# Patient Record
Sex: Female | Born: 1960
Health system: Southern US, Community
[De-identification: ages and names within clinical notes are randomized; demographics above are authoritative.]

## PROBLEM LIST (undated history)

## (undated) DIAGNOSIS — M419 Scoliosis, unspecified: Secondary | ICD-10-CM

## (undated) DIAGNOSIS — E785 Hyperlipidemia, unspecified: Secondary | ICD-10-CM

## (undated) DIAGNOSIS — G894 Chronic pain syndrome: Secondary | ICD-10-CM

## (undated) DIAGNOSIS — I5032 Chronic diastolic (congestive) heart failure: Secondary | ICD-10-CM

## (undated) DIAGNOSIS — K589 Irritable bowel syndrome without diarrhea: Secondary | ICD-10-CM

## (undated) DIAGNOSIS — M069 Rheumatoid arthritis, unspecified: Secondary | ICD-10-CM

## (undated) DIAGNOSIS — Z8739 Personal history of other diseases of the musculoskeletal system and connective tissue: Secondary | ICD-10-CM

## (undated) DIAGNOSIS — I493 Ventricular premature depolarization: Secondary | ICD-10-CM

## (undated) DIAGNOSIS — G43909 Migraine, unspecified, not intractable, without status migrainosus: Secondary | ICD-10-CM

## (undated) DIAGNOSIS — J189 Pneumonia, unspecified organism: Secondary | ICD-10-CM

## (undated) DIAGNOSIS — M351 Other overlap syndromes: Secondary | ICD-10-CM

## (undated) DIAGNOSIS — I350 Nonrheumatic aortic (valve) stenosis: Secondary | ICD-10-CM

## (undated) DIAGNOSIS — K635 Polyp of colon: Secondary | ICD-10-CM

## (undated) DIAGNOSIS — E119 Type 2 diabetes mellitus without complications: Secondary | ICD-10-CM

## (undated) DIAGNOSIS — G8929 Other chronic pain: Secondary | ICD-10-CM

## (undated) DIAGNOSIS — L309 Dermatitis, unspecified: Secondary | ICD-10-CM

## (undated) DIAGNOSIS — M479 Spondylosis, unspecified: Secondary | ICD-10-CM

## (undated) DIAGNOSIS — Z8719 Personal history of other diseases of the digestive system: Secondary | ICD-10-CM

## (undated) DIAGNOSIS — K8020 Calculus of gallbladder without cholecystitis without obstruction: Secondary | ICD-10-CM

## (undated) DIAGNOSIS — K219 Gastro-esophageal reflux disease without esophagitis: Secondary | ICD-10-CM

## (undated) DIAGNOSIS — I10 Essential (primary) hypertension: Secondary | ICD-10-CM

## (undated) DIAGNOSIS — E039 Hypothyroidism, unspecified: Secondary | ICD-10-CM

## (undated) DIAGNOSIS — G4719 Other hypersomnia: Secondary | ICD-10-CM

## (undated) DIAGNOSIS — Z9989 Dependence on other enabling machines and devices: Secondary | ICD-10-CM

## (undated) DIAGNOSIS — I2510 Atherosclerotic heart disease of native coronary artery without angina pectoris: Secondary | ICD-10-CM

## (undated) DIAGNOSIS — R011 Cardiac murmur, unspecified: Secondary | ICD-10-CM

## (undated) DIAGNOSIS — K760 Fatty (change of) liver, not elsewhere classified: Secondary | ICD-10-CM

## (undated) DIAGNOSIS — Z9289 Personal history of other medical treatment: Secondary | ICD-10-CM

## (undated) DIAGNOSIS — M545 Low back pain, unspecified: Secondary | ICD-10-CM

## (undated) DIAGNOSIS — G4730 Sleep apnea, unspecified: Secondary | ICD-10-CM

## (undated) DIAGNOSIS — M3500 Sicca syndrome, unspecified: Secondary | ICD-10-CM

## (undated) DIAGNOSIS — M542 Cervicalgia: Secondary | ICD-10-CM

## (undated) DIAGNOSIS — M797 Fibromyalgia: Secondary | ICD-10-CM

## (undated) HISTORY — DX: Pneumonia, unspecified organism: J18.9

## (undated) HISTORY — DX: Sjogren syndrome, unspecified: M35.00

## (undated) HISTORY — PX: DILATION AND CURETTAGE OF UTERUS: SHX78

## (undated) HISTORY — DX: Calculus of gallbladder without cholecystitis without obstruction: K80.20

## (undated) HISTORY — DX: Hyperlipidemia, unspecified: E78.5

## (undated) HISTORY — DX: Fatty (change of) liver, not elsewhere classified: K76.0

## (undated) HISTORY — DX: Nonrheumatic aortic (valve) stenosis: I35.0

## (undated) HISTORY — DX: Polyp of colon: K63.5

## (undated) HISTORY — DX: Fibromyalgia: M79.7

## (undated) HISTORY — DX: Other hypersomnia: G47.19

## (undated) HISTORY — PX: TONSILLECTOMY AND ADENOIDECTOMY: SUR1326

## (undated) HISTORY — PX: MUSCLE BIOPSY: SHX716

## (undated) HISTORY — PX: APPENDECTOMY: SHX54

## (undated) HISTORY — DX: Ventricular premature depolarization: I49.3

## (undated) HISTORY — PX: WRIST FRACTURE SURGERY: SHX121

## (undated) HISTORY — PX: LAPAROSCOPIC CHOLECYSTECTOMY: SUR755

## (undated) HISTORY — PX: FRACTURE SURGERY: SHX138

## (undated) HISTORY — DX: Chronic diastolic (congestive) heart failure: I50.32

## (undated) HISTORY — PX: BREAST BIOPSY: SHX20

---

## 1998-02-15 ENCOUNTER — Ambulatory Visit (HOSPITAL_COMMUNITY): Admission: RE | Admit: 1998-02-15 | Discharge: 1998-02-15 | Payer: Self-pay | Admitting: General Surgery

## 1999-05-04 ENCOUNTER — Other Ambulatory Visit: Admission: RE | Admit: 1999-05-04 | Discharge: 1999-05-04 | Payer: Self-pay | Admitting: *Deleted

## 2005-06-11 HISTORY — PX: ABDOMINAL HYSTERECTOMY: SHX81

## 2008-12-06 ENCOUNTER — Inpatient Hospital Stay (HOSPITAL_COMMUNITY): Admission: EM | Admit: 2008-12-06 | Discharge: 2008-12-12 | Payer: Self-pay | Admitting: Internal Medicine

## 2008-12-06 ENCOUNTER — Encounter: Payer: Self-pay | Admitting: Emergency Medicine

## 2008-12-06 ENCOUNTER — Ambulatory Visit: Payer: Self-pay | Admitting: Diagnostic Radiology

## 2008-12-07 ENCOUNTER — Encounter (INDEPENDENT_AMBULATORY_CARE_PROVIDER_SITE_OTHER): Payer: Self-pay | Admitting: Internal Medicine

## 2008-12-07 ENCOUNTER — Ambulatory Visit: Payer: Self-pay | Admitting: Surgery

## 2008-12-09 ENCOUNTER — Ambulatory Visit: Payer: Self-pay | Admitting: Infectious Diseases

## 2010-07-12 DIAGNOSIS — Z9289 Personal history of other medical treatment: Secondary | ICD-10-CM

## 2010-07-12 HISTORY — DX: Personal history of other medical treatment: Z92.89

## 2010-07-12 HISTORY — PX: OOPHORECTOMY: SHX86

## 2010-07-14 ENCOUNTER — Ambulatory Visit: Admission: RE | Admit: 2010-07-14 | Discharge: 2010-07-14 | Payer: Self-pay | Admitting: Gynecologic Oncology

## 2010-08-02 ENCOUNTER — Encounter: Payer: Self-pay | Admitting: Obstetrics and Gynecology

## 2010-08-02 ENCOUNTER — Inpatient Hospital Stay (HOSPITAL_COMMUNITY): Admission: RE | Admit: 2010-08-02 | Discharge: 2010-08-08 | Payer: Self-pay | Admitting: Obstetrics and Gynecology

## 2010-08-02 ENCOUNTER — Ambulatory Visit: Payer: Self-pay | Admitting: Cardiology

## 2010-08-04 ENCOUNTER — Encounter: Payer: Self-pay | Admitting: Cardiology

## 2010-10-01 ENCOUNTER — Emergency Department (HOSPITAL_BASED_OUTPATIENT_CLINIC_OR_DEPARTMENT_OTHER)
Admission: EM | Admit: 2010-10-01 | Discharge: 2010-10-01 | Payer: Self-pay | Source: Home / Self Care | Admitting: Emergency Medicine

## 2010-10-10 ENCOUNTER — Ambulatory Visit (HOSPITAL_COMMUNITY): Admission: RE | Admit: 2010-10-10 | Payer: Self-pay | Admitting: Obstetrics and Gynecology

## 2010-11-22 LAB — BASIC METABOLIC PANEL
BUN: 10 mg/dL (ref 6–23)
BUN: 18 mg/dL (ref 6–23)
BUN: 5 mg/dL — ABNORMAL LOW (ref 6–23)
BUN: 8 mg/dL (ref 6–23)
CO2: 23 mEq/L (ref 19–32)
CO2: 24 mEq/L (ref 19–32)
CO2: 28 mEq/L (ref 19–32)
CO2: 31 mEq/L (ref 19–32)
Calcium: 7.9 mg/dL — ABNORMAL LOW (ref 8.4–10.5)
Calcium: 8.1 mg/dL — ABNORMAL LOW (ref 8.4–10.5)
Calcium: 8.2 mg/dL — ABNORMAL LOW (ref 8.4–10.5)
Calcium: 8.9 mg/dL (ref 8.4–10.5)
Chloride: 101 mEq/L (ref 96–112)
Chloride: 101 mEq/L (ref 96–112)
Chloride: 104 mEq/L (ref 96–112)
Chloride: 106 mEq/L (ref 96–112)
Creatinine, Ser: 0.7 mg/dL (ref 0.4–1.2)
Creatinine, Ser: 0.77 mg/dL (ref 0.4–1.2)
Creatinine, Ser: 1.63 mg/dL — ABNORMAL HIGH (ref 0.4–1.2)
Creatinine, Ser: 1.91 mg/dL — ABNORMAL HIGH (ref 0.4–1.2)
GFR calc Af Amer: 34 mL/min — ABNORMAL LOW (ref >60)
GFR calc Af Amer: 41 mL/min — ABNORMAL LOW (ref >60)
GFR calc Af Amer: >60 mL/min (ref >60)
GFR calc Af Amer: >60 mL/min (ref >60)
GFR calc non Af Amer: 28 mL/min — ABNORMAL LOW (ref >60)
GFR calc non Af Amer: 34 mL/min — ABNORMAL LOW (ref >60)
GFR calc non Af Amer: >60 mL/min (ref >60)
GFR calc non Af Amer: >60 mL/min (ref >60)
Glucose, Bld: 132 mg/dL — ABNORMAL HIGH (ref 70–99)
Glucose, Bld: 223 mg/dL — ABNORMAL HIGH (ref 70–99)
Glucose, Bld: 337 mg/dL — ABNORMAL HIGH (ref 70–99)
Glucose, Bld: 91 mg/dL (ref 70–99)
Potassium: 3.8 mEq/L (ref 3.5–5.1)
Potassium: 4 mEq/L (ref 3.5–5.1)
Potassium: 5.2 mEq/L — ABNORMAL HIGH (ref 3.5–5.1)
Potassium: 6.5 mEq/L (ref 3.5–5.1)
Sodium: 132 mEq/L — ABNORMAL LOW (ref 135–145)
Sodium: 133 mEq/L — ABNORMAL LOW (ref 135–145)
Sodium: 140 mEq/L (ref 135–145)
Sodium: 142 mEq/L (ref 135–145)

## 2010-11-22 LAB — GLUCOSE, CAPILLARY
Glucose-Capillary: 101 mg/dL — ABNORMAL HIGH (ref 70–99)
Glucose-Capillary: 104 mg/dL — ABNORMAL HIGH (ref 70–99)
Glucose-Capillary: 110 mg/dL — ABNORMAL HIGH (ref 70–99)
Glucose-Capillary: 113 mg/dL — ABNORMAL HIGH (ref 70–99)
Glucose-Capillary: 120 mg/dL — ABNORMAL HIGH (ref 70–99)
Glucose-Capillary: 125 mg/dL — ABNORMAL HIGH (ref 70–99)
Glucose-Capillary: 129 mg/dL — ABNORMAL HIGH (ref 70–99)
Glucose-Capillary: 131 mg/dL — ABNORMAL HIGH (ref 70–99)
Glucose-Capillary: 135 mg/dL — ABNORMAL HIGH (ref 70–99)
Glucose-Capillary: 138 mg/dL — ABNORMAL HIGH (ref 70–99)
Glucose-Capillary: 153 mg/dL — ABNORMAL HIGH (ref 70–99)
Glucose-Capillary: 155 mg/dL — ABNORMAL HIGH (ref 70–99)
Glucose-Capillary: 157 mg/dL — ABNORMAL HIGH (ref 70–99)
Glucose-Capillary: 160 mg/dL — ABNORMAL HIGH (ref 70–99)
Glucose-Capillary: 165 mg/dL — ABNORMAL HIGH (ref 70–99)
Glucose-Capillary: 170 mg/dL — ABNORMAL HIGH (ref 70–99)
Glucose-Capillary: 179 mg/dL — ABNORMAL HIGH (ref 70–99)
Glucose-Capillary: 184 mg/dL — ABNORMAL HIGH (ref 70–99)
Glucose-Capillary: 189 mg/dL — ABNORMAL HIGH (ref 70–99)
Glucose-Capillary: 195 mg/dL — ABNORMAL HIGH (ref 70–99)
Glucose-Capillary: 206 mg/dL — ABNORMAL HIGH (ref 70–99)
Glucose-Capillary: 212 mg/dL — ABNORMAL HIGH (ref 70–99)
Glucose-Capillary: 212 mg/dL — ABNORMAL HIGH (ref 70–99)
Glucose-Capillary: 225 mg/dL — ABNORMAL HIGH (ref 70–99)
Glucose-Capillary: 234 mg/dL — ABNORMAL HIGH (ref 70–99)
Glucose-Capillary: 256 mg/dL — ABNORMAL HIGH (ref 70–99)
Glucose-Capillary: 256 mg/dL — ABNORMAL HIGH (ref 70–99)
Glucose-Capillary: 270 mg/dL — ABNORMAL HIGH (ref 70–99)
Glucose-Capillary: 333 mg/dL — ABNORMAL HIGH (ref 70–99)
Glucose-Capillary: 75 mg/dL (ref 70–99)
Glucose-Capillary: 83 mg/dL (ref 70–99)
Glucose-Capillary: 89 mg/dL (ref 70–99)
Glucose-Capillary: 92 mg/dL (ref 70–99)
Glucose-Capillary: 97 mg/dL (ref 70–99)
Glucose-Capillary: 98 mg/dL (ref 70–99)

## 2010-11-22 LAB — COMPREHENSIVE METABOLIC PANEL
ALT: 103 U/L — ABNORMAL HIGH (ref 0–35)
ALT: 47 U/L — ABNORMAL HIGH (ref 0–35)
ALT: 64 U/L — ABNORMAL HIGH (ref 0–35)
AST: 35 U/L (ref 0–37)
AST: 50 U/L — ABNORMAL HIGH (ref 0–37)
AST: 69 U/L — ABNORMAL HIGH (ref 0–37)
Albumin: 2.6 g/dL — ABNORMAL LOW (ref 3.5–5.2)
Albumin: 2.9 g/dL — ABNORMAL LOW (ref 3.5–5.2)
Albumin: 3.7 g/dL (ref 3.5–5.2)
Alkaline Phosphatase: 37 U/L — ABNORMAL LOW (ref 39–117)
Alkaline Phosphatase: 43 U/L (ref 39–117)
Alkaline Phosphatase: 57 U/L (ref 39–117)
BUN: 11 mg/dL (ref 6–23)
BUN: 12 mg/dL (ref 6–23)
BUN: 13 mg/dL (ref 6–23)
CO2: 22 mEq/L (ref 19–32)
CO2: 24 mEq/L (ref 19–32)
CO2: 28 mEq/L (ref 19–32)
Calcium: 8.2 mg/dL — ABNORMAL LOW (ref 8.4–10.5)
Calcium: 8.2 mg/dL — ABNORMAL LOW (ref 8.4–10.5)
Calcium: 9.6 mg/dL (ref 8.4–10.5)
Chloride: 101 mEq/L (ref 96–112)
Chloride: 101 mEq/L (ref 96–112)
Chloride: 104 mEq/L (ref 96–112)
Creatinine, Ser: 0.74 mg/dL (ref 0.4–1.2)
Creatinine, Ser: 1.03 mg/dL (ref 0.4–1.2)
Creatinine, Ser: 2.05 mg/dL — ABNORMAL HIGH (ref 0.4–1.2)
GFR calc Af Amer: 31 mL/min — ABNORMAL LOW (ref >60)
GFR calc Af Amer: >60 mL/min (ref >60)
GFR calc Af Amer: >60 mL/min (ref >60)
GFR calc non Af Amer: 26 mL/min — ABNORMAL LOW (ref >60)
GFR calc non Af Amer: 57 mL/min — ABNORMAL LOW (ref >60)
GFR calc non Af Amer: >60 mL/min (ref >60)
Glucose, Bld: 104 mg/dL — ABNORMAL HIGH (ref 70–99)
Glucose, Bld: 286 mg/dL — ABNORMAL HIGH (ref 70–99)
Glucose, Bld: 88 mg/dL (ref 70–99)
Potassium: 4.1 mEq/L (ref 3.5–5.1)
Potassium: 4.1 mEq/L (ref 3.5–5.1)
Potassium: 5.1 mEq/L (ref 3.5–5.1)
Sodium: 133 mEq/L — ABNORMAL LOW (ref 135–145)
Sodium: 137 mEq/L (ref 135–145)
Sodium: 139 mEq/L (ref 135–145)
Total Bilirubin: 0.5 mg/dL (ref 0.3–1.2)
Total Bilirubin: 0.5 mg/dL (ref 0.3–1.2)
Total Bilirubin: 0.6 mg/dL (ref 0.3–1.2)
Total Protein: 5.3 g/dL — ABNORMAL LOW (ref 6.0–8.3)
Total Protein: 5.7 g/dL — ABNORMAL LOW (ref 6.0–8.3)
Total Protein: 7.1 g/dL (ref 6.0–8.3)

## 2010-11-22 LAB — SURGICAL PCR SCREEN
MRSA, PCR: NEGATIVE
Staphylococcus aureus: NEGATIVE

## 2010-11-22 LAB — DIFFERENTIAL
Basophils Absolute: 0.1 10*3/uL (ref 0.0–0.1)
Basophils Absolute: 0.1 10*3/uL (ref 0.0–0.1)
Basophils Absolute: 0.1 10*3/uL (ref 0.0–0.1)
Basophils Absolute: 0.1 10*3/uL (ref 0.0–0.1)
Basophils Absolute: 0.1 10*3/uL (ref 0.0–0.1)
Basophils Absolute: 0.1 10*3/uL (ref 0.0–0.1)
Basophils Relative: 0 % (ref 0–1)
Basophils Relative: 0 % (ref 0–1)
Basophils Relative: 1 % (ref 0–1)
Basophils Relative: 1 % (ref 0–1)
Basophils Relative: 1 % (ref 0–1)
Basophils Relative: 1 % (ref 0–1)
Eosinophils Absolute: 0 10*3/uL (ref 0.0–0.7)
Eosinophils Absolute: 0 10*3/uL (ref 0.0–0.7)
Eosinophils Absolute: 0.3 10*3/uL (ref 0.0–0.7)
Eosinophils Absolute: 0.5 10*3/uL (ref 0.0–0.7)
Eosinophils Absolute: 0.6 10*3/uL (ref 0.0–0.7)
Eosinophils Absolute: 0.9 10*3/uL — ABNORMAL HIGH (ref 0.0–0.7)
Eosinophils Relative: 0 % (ref 0–5)
Eosinophils Relative: 0 % (ref 0–5)
Eosinophils Relative: 2 % (ref 0–5)
Eosinophils Relative: 4 % (ref 0–5)
Eosinophils Relative: 5 % (ref 0–5)
Eosinophils Relative: 8 % — ABNORMAL HIGH (ref 0–5)
Lymphocytes Relative: 17 % (ref 12–46)
Lymphocytes Relative: 17 % (ref 12–46)
Lymphocytes Relative: 20 % (ref 12–46)
Lymphocytes Relative: 27 % (ref 12–46)
Lymphocytes Relative: 29 % (ref 12–46)
Lymphocytes Relative: 30 % (ref 12–46)
Lymphs Abs: 2.8 10*3/uL (ref 0.7–4.0)
Lymphs Abs: 3 10*3/uL (ref 0.7–4.0)
Lymphs Abs: 3.2 10*3/uL (ref 0.7–4.0)
Lymphs Abs: 3.2 10*3/uL (ref 0.7–4.0)
Lymphs Abs: 3.6 10*3/uL (ref 0.7–4.0)
Lymphs Abs: 3.9 10*3/uL (ref 0.7–4.0)
Monocytes Absolute: 0.9 10*3/uL (ref 0.1–1.0)
Monocytes Absolute: 1.1 10*3/uL — ABNORMAL HIGH (ref 0.1–1.0)
Monocytes Absolute: 1.1 10*3/uL — ABNORMAL HIGH (ref 0.1–1.0)
Monocytes Absolute: 1.2 10*3/uL — ABNORMAL HIGH (ref 0.1–1.0)
Monocytes Absolute: 1.3 10*3/uL — ABNORMAL HIGH (ref 0.1–1.0)
Monocytes Absolute: 1.8 10*3/uL — ABNORMAL HIGH (ref 0.1–1.0)
Monocytes Relative: 10 % (ref 3–12)
Monocytes Relative: 7 % (ref 3–12)
Monocytes Relative: 8 % (ref 3–12)
Monocytes Relative: 8 % (ref 3–12)
Monocytes Relative: 9 % (ref 3–12)
Monocytes Relative: 9 % (ref 3–12)
Neutro Abs: 10.6 10*3/uL — ABNORMAL HIGH (ref 1.7–7.7)
Neutro Abs: 12.5 10*3/uL — ABNORMAL HIGH (ref 1.7–7.7)
Neutro Abs: 13.4 10*3/uL — ABNORMAL HIGH (ref 1.7–7.7)
Neutro Abs: 5.6 10*3/uL (ref 1.7–7.7)
Neutro Abs: 7.8 10*3/uL — ABNORMAL HIGH (ref 1.7–7.7)
Neutro Abs: 8.2 10*3/uL — ABNORMAL HIGH (ref 1.7–7.7)
Neutrophils Relative %: 52 % (ref 43–77)
Neutrophils Relative %: 59 % (ref 43–77)
Neutrophils Relative %: 60 % (ref 43–77)
Neutrophils Relative %: 68 % (ref 43–77)
Neutrophils Relative %: 73 % (ref 43–77)
Neutrophils Relative %: 76 % (ref 43–77)

## 2010-11-22 LAB — CBC
HCT: 25.9 % — ABNORMAL LOW (ref 36.0–46.0)
HCT: 26.7 % — ABNORMAL LOW (ref 36.0–46.0)
HCT: 26.7 % — ABNORMAL LOW (ref 36.0–46.0)
HCT: 27.2 % — ABNORMAL LOW (ref 36.0–46.0)
HCT: 28.1 % — ABNORMAL LOW (ref 36.0–46.0)
HCT: 28.6 % — ABNORMAL LOW (ref 36.0–46.0)
HCT: 29.4 % — ABNORMAL LOW (ref 36.0–46.0)
HCT: 44.2 % (ref 36.0–46.0)
Hemoglobin: 10.1 g/dL — ABNORMAL LOW (ref 12.0–15.0)
Hemoglobin: 14.9 g/dL (ref 12.0–15.0)
Hemoglobin: 8.8 g/dL — ABNORMAL LOW (ref 12.0–15.0)
Hemoglobin: 9.1 g/dL — ABNORMAL LOW (ref 12.0–15.0)
Hemoglobin: 9.2 g/dL — ABNORMAL LOW (ref 12.0–15.0)
Hemoglobin: 9.3 g/dL — ABNORMAL LOW (ref 12.0–15.0)
Hemoglobin: 9.8 g/dL — ABNORMAL LOW (ref 12.0–15.0)
Hemoglobin: 9.8 g/dL — ABNORMAL LOW (ref 12.0–15.0)
MCH: 29.6 pg (ref 26.0–34.0)
MCH: 29.8 pg (ref 26.0–34.0)
MCH: 29.8 pg (ref 26.0–34.0)
MCH: 29.9 pg (ref 26.0–34.0)
MCH: 30 pg (ref 26.0–34.0)
MCH: 30 pg (ref 26.0–34.0)
MCH: 30.1 pg (ref 26.0–34.0)
MCH: 30.5 pg (ref 26.0–34.0)
MCHC: 33.8 g/dL (ref 30.0–36.0)
MCHC: 33.8 g/dL (ref 30.0–36.0)
MCHC: 33.9 g/dL (ref 30.0–36.0)
MCHC: 34 g/dL (ref 30.0–36.0)
MCHC: 34.2 g/dL (ref 30.0–36.0)
MCHC: 34.2 g/dL (ref 30.0–36.0)
MCHC: 34.7 g/dL (ref 30.0–36.0)
MCHC: 34.8 g/dL (ref 30.0–36.0)
MCV: 86.6 fL (ref 78.0–100.0)
MCV: 87.3 fL (ref 78.0–100.0)
MCV: 87.5 fL (ref 78.0–100.0)
MCV: 87.6 fL (ref 78.0–100.0)
MCV: 87.8 fL (ref 78.0–100.0)
MCV: 87.8 fL (ref 78.0–100.0)
MCV: 88 fL (ref 78.0–100.0)
MCV: 88.2 fL (ref 78.0–100.0)
Platelets: 165 10*3/uL (ref 150–400)
Platelets: 187 10*3/uL (ref 150–400)
Platelets: 193 10*3/uL (ref 150–400)
Platelets: 197 10*3/uL (ref 150–400)
Platelets: 211 10*3/uL (ref 150–400)
Platelets: 240 10*3/uL (ref 150–400)
Platelets: 256 10*3/uL (ref 150–400)
Platelets: 263 10*3/uL (ref 150–400)
RBC: 2.95 MIL/uL — ABNORMAL LOW (ref 3.87–5.11)
RBC: 3.04 MIL/uL — ABNORMAL LOW (ref 3.87–5.11)
RBC: 3.09 MIL/uL — ABNORMAL LOW (ref 3.87–5.11)
RBC: 3.11 MIL/uL — ABNORMAL LOW (ref 3.87–5.11)
RBC: 3.2 MIL/uL — ABNORMAL LOW (ref 3.87–5.11)
RBC: 3.28 MIL/uL — ABNORMAL LOW (ref 3.87–5.11)
RBC: 3.35 MIL/uL — ABNORMAL LOW (ref 3.87–5.11)
RBC: 5.02 MIL/uL (ref 3.87–5.11)
RDW: 13.6 % (ref 11.5–15.5)
RDW: 13.6 % (ref 11.5–15.5)
RDW: 13.7 % (ref 11.5–15.5)
RDW: 14.2 % (ref 11.5–15.5)
RDW: 14.3 % (ref 11.5–15.5)
RDW: 14.5 % (ref 11.5–15.5)
RDW: 14.6 % (ref 11.5–15.5)
RDW: 14.9 % (ref 11.5–15.5)
WBC: 10.7 10*3/uL — ABNORMAL HIGH (ref 4.0–10.5)
WBC: 13.3 10*3/uL — ABNORMAL HIGH (ref 4.0–10.5)
WBC: 13.6 10*3/uL — ABNORMAL HIGH (ref 4.0–10.5)
WBC: 15.5 10*3/uL — ABNORMAL HIGH (ref 4.0–10.5)
WBC: 15.6 10*3/uL — ABNORMAL HIGH (ref 4.0–10.5)
WBC: 16.6 10*3/uL — ABNORMAL HIGH (ref 4.0–10.5)
WBC: 18.3 10*3/uL — ABNORMAL HIGH (ref 4.0–10.5)
WBC: 19.1 10*3/uL — ABNORMAL HIGH (ref 4.0–10.5)

## 2010-11-22 LAB — CARDIAC PANEL(CRET KIN+CKTOT+MB+TROPI)
CK, MB: 2.9 ng/mL (ref 0.3–4.0)
CK, MB: 4.1 ng/mL — ABNORMAL HIGH (ref 0.3–4.0)
CK, MB: 4.3 ng/mL — ABNORMAL HIGH (ref 0.3–4.0)
CK, MB: 4.9 ng/mL — ABNORMAL HIGH (ref 0.3–4.0)
CK, MB: 5.2 ng/mL — ABNORMAL HIGH (ref 0.3–4.0)
Relative Index: 1.2 (ref 0.0–2.5)
Relative Index: 1.3 (ref 0.0–2.5)
Relative Index: 1.6 (ref 0.0–2.5)
Relative Index: 1.8 (ref 0.0–2.5)
Relative Index: 1.9 (ref 0.0–2.5)
Total CK: 226 U/L — ABNORMAL HIGH (ref 7–177)
Total CK: 248 U/L — ABNORMAL HIGH (ref 7–177)
Total CK: 262 U/L — ABNORMAL HIGH (ref 7–177)
Total CK: 319 U/L — ABNORMAL HIGH (ref 7–177)
Total CK: 322 U/L — ABNORMAL HIGH (ref 7–177)
Troponin I: 0.03 ng/mL (ref 0.00–0.06)
Troponin I: 0.07 ng/mL — ABNORMAL HIGH (ref 0.00–0.06)
Troponin I: 0.08 ng/mL — ABNORMAL HIGH (ref 0.00–0.06)
Troponin I: 0.1 ng/mL — ABNORMAL HIGH (ref 0.00–0.06)
Troponin I: 0.13 ng/mL — ABNORMAL HIGH (ref 0.00–0.06)

## 2010-11-22 LAB — TYPE AND SCREEN
ABO/RH(D): A POS
Antibody Screen: NEGATIVE
Unit division: 0
Unit division: 0
Unit division: 0
Unit division: 0

## 2010-11-22 LAB — ABO/RH: ABO/RH(D): A POS

## 2010-11-22 LAB — APTT: aPTT: 31 seconds (ref 24–37)

## 2010-11-22 LAB — HEMOGLOBIN AND HEMATOCRIT, BLOOD
HCT: 29.5 % — ABNORMAL LOW (ref 36.0–46.0)
Hemoglobin: 10.1 g/dL — ABNORMAL LOW (ref 12.0–15.0)

## 2010-11-22 LAB — PREPARE RBC (CROSSMATCH)

## 2010-11-22 LAB — PROTIME-INR
INR: 1.2 (ref 0.00–1.49)
Prothrombin Time: 15.4 seconds — ABNORMAL HIGH (ref 11.6–15.2)

## 2010-12-21 LAB — GLUCOSE, CAPILLARY
Glucose-Capillary: 137 mg/dL — ABNORMAL HIGH (ref 70–99)
Glucose-Capillary: 163 mg/dL — ABNORMAL HIGH (ref 70–99)
Glucose-Capillary: 182 mg/dL — ABNORMAL HIGH (ref 70–99)
Glucose-Capillary: 202 mg/dL — ABNORMAL HIGH (ref 70–99)
Glucose-Capillary: 220 mg/dL — ABNORMAL HIGH (ref 70–99)
Glucose-Capillary: 224 mg/dL — ABNORMAL HIGH (ref 70–99)
Glucose-Capillary: 246 mg/dL — ABNORMAL HIGH (ref 70–99)
Glucose-Capillary: 290 mg/dL — ABNORMAL HIGH (ref 70–99)
Glucose-Capillary: 315 mg/dL — ABNORMAL HIGH (ref 70–99)
Glucose-Capillary: 379 mg/dL — ABNORMAL HIGH (ref 70–99)
Glucose-Capillary: 394 mg/dL — ABNORMAL HIGH (ref 70–99)
Glucose-Capillary: 431 mg/dL — ABNORMAL HIGH (ref 70–99)

## 2010-12-21 LAB — VANCOMYCIN, TROUGH: Vancomycin Tr: 17.2 ug/mL (ref 10.0–20.0)

## 2010-12-21 LAB — BASIC METABOLIC PANEL
BUN: 3 mg/dL — ABNORMAL LOW (ref 6–23)
BUN: 3 mg/dL — ABNORMAL LOW (ref 6–23)
BUN: 8 mg/dL (ref 6–23)
CO2: 26 mEq/L (ref 19–32)
CO2: 28 mEq/L (ref 19–32)
CO2: 29 mEq/L (ref 19–32)
Calcium: 8 mg/dL — ABNORMAL LOW (ref 8.4–10.5)
Calcium: 8.6 mg/dL (ref 8.4–10.5)
Calcium: 9.2 mg/dL (ref 8.4–10.5)
Chloride: 104 mEq/L (ref 96–112)
Chloride: 109 mEq/L (ref 96–112)
Chloride: 95 mEq/L — ABNORMAL LOW (ref 96–112)
Creatinine, Ser: 0.9 mg/dL (ref 0.4–1.2)
Creatinine, Ser: 0.93 mg/dL (ref 0.4–1.2)
Creatinine, Ser: 0.96 mg/dL (ref 0.4–1.2)
GFR calc Af Amer: >60 mL/min (ref >60)
GFR calc Af Amer: >60 mL/min (ref >60)
GFR calc Af Amer: >60 mL/min (ref >60)
GFR calc non Af Amer: >60 mL/min (ref >60)
GFR calc non Af Amer: >60 mL/min (ref >60)
GFR calc non Af Amer: >60 mL/min (ref >60)
Glucose, Bld: 161 mg/dL — ABNORMAL HIGH (ref 70–99)
Glucose, Bld: 175 mg/dL — ABNORMAL HIGH (ref 70–99)
Glucose, Bld: 422 mg/dL — ABNORMAL HIGH (ref 70–99)
Potassium: 3.5 mEq/L (ref 3.5–5.1)
Potassium: 3.9 mEq/L (ref 3.5–5.1)
Potassium: 4 mEq/L (ref 3.5–5.1)
Sodium: 134 mEq/L — ABNORMAL LOW (ref 135–145)
Sodium: 140 mEq/L (ref 135–145)
Sodium: 141 mEq/L (ref 135–145)

## 2010-12-21 LAB — CBC
HCT: 37.2 % (ref 36.0–46.0)
HCT: 38.6 % (ref 36.0–46.0)
Hemoglobin: 12.5 g/dL (ref 12.0–15.0)
Hemoglobin: 12.8 g/dL (ref 12.0–15.0)
MCHC: 33.1 g/dL (ref 30.0–36.0)
MCHC: 33.6 g/dL (ref 30.0–36.0)
MCV: 89.3 fL (ref 78.0–100.0)
MCV: 89.8 fL (ref 78.0–100.0)
Platelets: 233 10*3/uL (ref 150–400)
Platelets: 268 10*3/uL (ref 150–400)
RBC: 4.14 MIL/uL (ref 3.87–5.11)
RBC: 4.32 MIL/uL (ref 3.87–5.11)
RDW: 14.1 % (ref 11.5–15.5)
RDW: 14.5 % (ref 11.5–15.5)
WBC: 8.2 10*3/uL (ref 4.0–10.5)
WBC: 9 10*3/uL (ref 4.0–10.5)

## 2010-12-21 LAB — HEPARIN LEVEL (UNFRACTIONATED): Heparin Unfractionated: <0.1 IU/mL — ABNORMAL LOW (ref 0.30–0.70)

## 2010-12-21 LAB — MAGNESIUM: Magnesium: 2.1 mg/dL (ref 1.5–2.5)

## 2010-12-22 LAB — BASIC METABOLIC PANEL
BUN: 10 mg/dL (ref 6–23)
BUN: 6 mg/dL (ref 6–23)
CO2: 22 mEq/L (ref 19–32)
CO2: 25 mEq/L (ref 19–32)
Calcium: 8.3 mg/dL — ABNORMAL LOW (ref 8.4–10.5)
Calcium: 9.9 mg/dL (ref 8.4–10.5)
Chloride: 103 mEq/L (ref 96–112)
Chloride: 96 mEq/L (ref 96–112)
Creatinine, Ser: 0.8 mg/dL (ref 0.4–1.2)
Creatinine, Ser: 0.86 mg/dL (ref 0.4–1.2)
GFR calc Af Amer: >60 mL/min (ref >60)
GFR calc Af Amer: >60 mL/min (ref >60)
GFR calc non Af Amer: >60 mL/min (ref >60)
GFR calc non Af Amer: >60 mL/min (ref >60)
Glucose, Bld: 219 mg/dL — ABNORMAL HIGH (ref 70–99)
Glucose, Bld: 253 mg/dL — ABNORMAL HIGH (ref 70–99)
Potassium: 3.7 mEq/L (ref 3.5–5.1)
Potassium: 5.3 mEq/L — ABNORMAL HIGH (ref 3.5–5.1)
Sodium: 136 mEq/L (ref 135–145)
Sodium: 136 mEq/L (ref 135–145)

## 2010-12-22 LAB — COMPREHENSIVE METABOLIC PANEL
ALT: 116 U/L — ABNORMAL HIGH (ref 0–35)
ALT: 59 U/L — ABNORMAL HIGH (ref 0–35)
ALT: 76 U/L — ABNORMAL HIGH (ref 0–35)
AST: 39 U/L — ABNORMAL HIGH (ref 0–37)
AST: 43 U/L — ABNORMAL HIGH (ref 0–37)
AST: 74 U/L — ABNORMAL HIGH (ref 0–37)
Albumin: 2.7 g/dL — ABNORMAL LOW (ref 3.5–5.2)
Albumin: 2.8 g/dL — ABNORMAL LOW (ref 3.5–5.2)
Albumin: 3.2 g/dL — ABNORMAL LOW (ref 3.5–5.2)
Alkaline Phosphatase: 44 U/L (ref 39–117)
Alkaline Phosphatase: 49 U/L (ref 39–117)
Alkaline Phosphatase: 49 U/L (ref 39–117)
BUN: 1 mg/dL — ABNORMAL LOW (ref 6–23)
BUN: 3 mg/dL — ABNORMAL LOW (ref 6–23)
BUN: 5 mg/dL — ABNORMAL LOW (ref 6–23)
CO2: 23 mEq/L (ref 19–32)
CO2: 24 mEq/L (ref 19–32)
CO2: 25 mEq/L (ref 19–32)
Calcium: 7.6 mg/dL — ABNORMAL LOW (ref 8.4–10.5)
Calcium: 7.9 mg/dL — ABNORMAL LOW (ref 8.4–10.5)
Calcium: 7.9 mg/dL — ABNORMAL LOW (ref 8.4–10.5)
Chloride: 100 mEq/L (ref 96–112)
Chloride: 100 mEq/L (ref 96–112)
Chloride: 106 mEq/L (ref 96–112)
Creatinine, Ser: 0.81 mg/dL (ref 0.4–1.2)
Creatinine, Ser: 0.82 mg/dL (ref 0.4–1.2)
Creatinine, Ser: 0.84 mg/dL (ref 0.4–1.2)
GFR calc Af Amer: >60 mL/min (ref >60)
GFR calc Af Amer: >60 mL/min (ref >60)
GFR calc Af Amer: >60 mL/min (ref >60)
GFR calc non Af Amer: >60 mL/min (ref >60)
GFR calc non Af Amer: >60 mL/min (ref >60)
GFR calc non Af Amer: >60 mL/min (ref >60)
Glucose, Bld: 192 mg/dL — ABNORMAL HIGH (ref 70–99)
Glucose, Bld: 208 mg/dL — ABNORMAL HIGH (ref 70–99)
Glucose, Bld: 215 mg/dL — ABNORMAL HIGH (ref 70–99)
Potassium: 2.9 mEq/L — ABNORMAL LOW (ref 3.5–5.1)
Potassium: 3.4 mEq/L — ABNORMAL LOW (ref 3.5–5.1)
Potassium: 3.6 mEq/L (ref 3.5–5.1)
Sodium: 132 mEq/L — ABNORMAL LOW (ref 135–145)
Sodium: 135 mEq/L (ref 135–145)
Sodium: 137 mEq/L (ref 135–145)
Total Bilirubin: 0.9 mg/dL (ref 0.3–1.2)
Total Bilirubin: 0.9 mg/dL (ref 0.3–1.2)
Total Bilirubin: 1 mg/dL (ref 0.3–1.2)
Total Protein: 5.7 g/dL — ABNORMAL LOW (ref 6.0–8.3)
Total Protein: 5.9 g/dL — ABNORMAL LOW (ref 6.0–8.3)
Total Protein: 6.3 g/dL (ref 6.0–8.3)

## 2010-12-22 LAB — GLUCOSE, CAPILLARY
Glucose-Capillary: 160 mg/dL — ABNORMAL HIGH (ref 70–99)
Glucose-Capillary: 183 mg/dL — ABNORMAL HIGH (ref 70–99)
Glucose-Capillary: 187 mg/dL — ABNORMAL HIGH (ref 70–99)
Glucose-Capillary: 198 mg/dL — ABNORMAL HIGH (ref 70–99)
Glucose-Capillary: 200 mg/dL — ABNORMAL HIGH (ref 70–99)
Glucose-Capillary: 211 mg/dL — ABNORMAL HIGH (ref 70–99)
Glucose-Capillary: 216 mg/dL — ABNORMAL HIGH (ref 70–99)
Glucose-Capillary: 219 mg/dL — ABNORMAL HIGH (ref 70–99)
Glucose-Capillary: 219 mg/dL — ABNORMAL HIGH (ref 70–99)
Glucose-Capillary: 222 mg/dL — ABNORMAL HIGH (ref 70–99)
Glucose-Capillary: 230 mg/dL — ABNORMAL HIGH (ref 70–99)
Glucose-Capillary: 244 mg/dL — ABNORMAL HIGH (ref 70–99)
Glucose-Capillary: 250 mg/dL — ABNORMAL HIGH (ref 70–99)
Glucose-Capillary: 254 mg/dL — ABNORMAL HIGH (ref 70–99)
Glucose-Capillary: 255 mg/dL — ABNORMAL HIGH (ref 70–99)
Glucose-Capillary: 257 mg/dL — ABNORMAL HIGH (ref 70–99)
Glucose-Capillary: 259 mg/dL — ABNORMAL HIGH (ref 70–99)
Glucose-Capillary: 268 mg/dL — ABNORMAL HIGH (ref 70–99)
Glucose-Capillary: 269 mg/dL — ABNORMAL HIGH (ref 70–99)

## 2010-12-22 LAB — DIFFERENTIAL
Basophils Absolute: 0.4 10*3/uL — ABNORMAL HIGH (ref 0.0–0.1)
Basophils Relative: 2 % — ABNORMAL HIGH (ref 0–1)
Eosinophils Absolute: 0.1 10*3/uL (ref 0.0–0.7)
Eosinophils Relative: 1 % (ref 0–5)
Lymphocytes Relative: 5 % — ABNORMAL LOW (ref 12–46)
Lymphs Abs: 1.2 10*3/uL (ref 0.7–4.0)
Monocytes Absolute: 1.1 10*3/uL — ABNORMAL HIGH (ref 0.1–1.0)
Monocytes Relative: 5 % (ref 3–12)
Neutro Abs: 21.1 10*3/uL — ABNORMAL HIGH (ref 1.7–7.7)
Neutrophils Relative %: 88 % — ABNORMAL HIGH (ref 43–77)

## 2010-12-22 LAB — HEPATIC FUNCTION PANEL
ALT: 264 U/L — ABNORMAL HIGH (ref 0–35)
AST: 332 U/L — ABNORMAL HIGH (ref 0–37)
Albumin: 5 g/dL (ref 3.5–5.2)
Alkaline Phosphatase: 70 U/L (ref 39–117)
Bilirubin, Direct: 0 mg/dL (ref 0.0–0.3)
Indirect Bilirubin: 1.6 mg/dL — ABNORMAL HIGH (ref 0.3–0.9)
Total Bilirubin: 1.6 mg/dL — ABNORMAL HIGH (ref 0.3–1.2)
Total Protein: 9.2 g/dL — ABNORMAL HIGH (ref 6.0–8.3)

## 2010-12-22 LAB — CLOSTRIDIUM DIFFICILE EIA: C difficile Toxins A+B, EIA: NEGATIVE

## 2010-12-22 LAB — BLOOD GAS, ARTERIAL
Acid-base deficit: 2.6 mmol/L — ABNORMAL HIGH (ref 0.0–2.0)
Bicarbonate: 20.4 mEq/L (ref 20.0–24.0)
Drawn by: 229971
O2 Content: 3 L/min
O2 Saturation: 97 %
Patient temperature: 100.1
TCO2: 17.7 mmol/L (ref 0–100)
pCO2 arterial: 33.1 mmHg — ABNORMAL LOW (ref 35.0–45.0)
pH, Arterial: 7.41 — ABNORMAL HIGH (ref 7.350–7.400)
pO2, Arterial: 94.8 mmHg (ref 80.0–100.0)

## 2010-12-22 LAB — CBC
HCT: 37.4 % (ref 36.0–46.0)
HCT: 38 % (ref 36.0–46.0)
HCT: 42.1 % (ref 36.0–46.0)
HCT: 50.6 % — ABNORMAL HIGH (ref 36.0–46.0)
Hemoglobin: 12.6 g/dL (ref 12.0–15.0)
Hemoglobin: 12.7 g/dL (ref 12.0–15.0)
Hemoglobin: 13.8 g/dL (ref 12.0–15.0)
Hemoglobin: 16.8 g/dL — ABNORMAL HIGH (ref 12.0–15.0)
MCHC: 32.7 g/dL (ref 30.0–36.0)
MCHC: 33.3 g/dL (ref 30.0–36.0)
MCHC: 33.4 g/dL (ref 30.0–36.0)
MCHC: 33.7 g/dL (ref 30.0–36.0)
MCV: 88 fL (ref 78.0–100.0)
MCV: 88.8 fL (ref 78.0–100.0)
MCV: 89 fL (ref 78.0–100.0)
MCV: 90.1 fL (ref 78.0–100.0)
Platelets: 172 10*3/uL (ref 150–400)
Platelets: 175 10*3/uL (ref 150–400)
Platelets: 208 10*3/uL (ref 150–400)
Platelets: 280 10*3/uL (ref 150–400)
RBC: 4.21 MIL/uL (ref 3.87–5.11)
RBC: 4.27 MIL/uL (ref 3.87–5.11)
RBC: 4.67 MIL/uL (ref 3.87–5.11)
RBC: 5.75 MIL/uL — ABNORMAL HIGH (ref 3.87–5.11)
RDW: 13.1 % (ref 11.5–15.5)
RDW: 13.9 % (ref 11.5–15.5)
RDW: 14.1 % (ref 11.5–15.5)
RDW: 14.2 % (ref 11.5–15.5)
WBC: 12.7 10*3/uL — ABNORMAL HIGH (ref 4.0–10.5)
WBC: 13.7 10*3/uL — ABNORMAL HIGH (ref 4.0–10.5)
WBC: 23.9 10*3/uL — ABNORMAL HIGH (ref 4.0–10.5)
WBC: 8.5 10*3/uL (ref 4.0–10.5)

## 2010-12-22 LAB — URINE MICROSCOPIC-ADD ON

## 2010-12-22 LAB — HEPATITIS PANEL, ACUTE
HCV Ab: NEGATIVE
Hep A IgM: NEGATIVE
Hep B C IgM: NEGATIVE
Hepatitis B Surface Ag: NEGATIVE

## 2010-12-22 LAB — HEMOGLOBIN A1C
Hgb A1c MFr Bld: 9.5 % — ABNORMAL HIGH (ref 4.6–6.1)
Mean Plasma Glucose: 226 mg/dL

## 2010-12-22 LAB — LACTIC ACID, PLASMA: Lactic Acid, Venous: 3.2 mmol/L — ABNORMAL HIGH (ref 0.5–2.2)

## 2010-12-22 LAB — FECAL LACTOFERRIN, QUANT: Fecal Lactoferrin: NEGATIVE

## 2010-12-22 LAB — URINALYSIS, ROUTINE W REFLEX MICROSCOPIC
Bilirubin Urine: NEGATIVE
Glucose, UA: 100 mg/dL — AB
Hgb urine dipstick: NEGATIVE
Ketones, ur: 15 mg/dL — AB
Leukocytes, UA: NEGATIVE
Nitrite: NEGATIVE
Protein, ur: 100 mg/dL — AB
Specific Gravity, Urine: 1.018 (ref 1.005–1.030)
Urobilinogen, UA: 0.2 mg/dL (ref 0.0–1.0)
pH: 5.5 (ref 5.0–8.0)

## 2010-12-22 LAB — CULTURE, BLOOD (ROUTINE X 2)
Culture: NO GROWTH
Culture: NO GROWTH

## 2010-12-22 LAB — LIPASE, BLOOD: Lipase: 103 U/L (ref 23–300)

## 2010-12-22 LAB — TSH: TSH: 2.451 u[IU]/mL (ref 0.350–4.500)

## 2011-01-19 ENCOUNTER — Emergency Department (HOSPITAL_BASED_OUTPATIENT_CLINIC_OR_DEPARTMENT_OTHER)
Admission: EM | Admit: 2011-01-19 | Discharge: 2011-01-19 | Disposition: A | Discharge status: Home / Self Care | Payer: PRIVATE HEALTH INSURANCE | Attending: Emergency Medicine | Admitting: Emergency Medicine

## 2011-01-19 ENCOUNTER — Emergency Department (INDEPENDENT_AMBULATORY_CARE_PROVIDER_SITE_OTHER): Payer: PRIVATE HEALTH INSURANCE

## 2011-01-19 DIAGNOSIS — R0602 Shortness of breath: Secondary | ICD-10-CM

## 2011-01-19 DIAGNOSIS — J45909 Unspecified asthma, uncomplicated: Secondary | ICD-10-CM | POA: Insufficient documentation

## 2011-01-19 DIAGNOSIS — G8929 Other chronic pain: Secondary | ICD-10-CM | POA: Insufficient documentation

## 2011-01-19 DIAGNOSIS — I10 Essential (primary) hypertension: Secondary | ICD-10-CM | POA: Insufficient documentation

## 2011-01-19 DIAGNOSIS — R748 Abnormal levels of other serum enzymes: Secondary | ICD-10-CM | POA: Insufficient documentation

## 2011-01-19 DIAGNOSIS — E119 Type 2 diabetes mellitus without complications: Secondary | ICD-10-CM | POA: Insufficient documentation

## 2011-01-19 DIAGNOSIS — Z79899 Other long term (current) drug therapy: Secondary | ICD-10-CM | POA: Insufficient documentation

## 2011-01-19 DIAGNOSIS — R079 Chest pain, unspecified: Secondary | ICD-10-CM | POA: Insufficient documentation

## 2011-01-19 DIAGNOSIS — E039 Hypothyroidism, unspecified: Secondary | ICD-10-CM | POA: Insufficient documentation

## 2011-01-19 LAB — COMPREHENSIVE METABOLIC PANEL
ALT: 120 U/L — ABNORMAL HIGH (ref 0–35)
AST: 90 U/L — ABNORMAL HIGH (ref 0–37)
Albumin: 3.8 g/dL (ref 3.5–5.2)
Alkaline Phosphatase: 72 U/L (ref 39–117)
BUN: 10 mg/dL (ref 6–23)
CO2: 26 mEq/L (ref 19–32)
Calcium: 10.6 mg/dL — ABNORMAL HIGH (ref 8.4–10.5)
Chloride: 98 mEq/L (ref 96–112)
Creatinine, Ser: 0.5 mg/dL (ref 0.4–1.2)
GFR calc Af Amer: >60 mL/min (ref >60)
GFR calc non Af Amer: >60 mL/min (ref >60)
Glucose, Bld: 157 mg/dL — ABNORMAL HIGH (ref 70–99)
Potassium: 4.2 mEq/L (ref 3.5–5.1)
Sodium: 137 mEq/L (ref 135–145)
Total Bilirubin: 0.6 mg/dL (ref 0.3–1.2)
Total Protein: 7.4 g/dL (ref 6.0–8.3)

## 2011-01-19 LAB — DIFFERENTIAL
Basophils Absolute: 0.1 10*3/uL (ref 0.0–0.1)
Basophils Relative: 1 % (ref 0–1)
Eosinophils Absolute: 0.3 10*3/uL (ref 0.0–0.7)
Eosinophils Relative: 4 % (ref 0–5)
Lymphocytes Relative: 46 % (ref 12–46)
Lymphs Abs: 3.7 10*3/uL (ref 0.7–4.0)
Monocytes Absolute: 0.8 10*3/uL (ref 0.1–1.0)
Monocytes Relative: 10 % (ref 3–12)
Neutro Abs: 3.2 10*3/uL (ref 1.7–7.7)
Neutrophils Relative %: 40 % — ABNORMAL LOW (ref 43–77)

## 2011-01-19 LAB — CBC
HCT: 43.8 % (ref 36.0–46.0)
Hemoglobin: 14.7 g/dL (ref 12.0–15.0)
MCH: 29.1 pg (ref 26.0–34.0)
MCHC: 33.6 g/dL (ref 30.0–36.0)
MCV: 86.7 fL (ref 78.0–100.0)
Platelets: 153 10*3/uL (ref 150–400)
RBC: 5.05 MIL/uL (ref 3.87–5.11)
RDW: 13.9 % (ref 11.5–15.5)
WBC: 8.1 10*3/uL (ref 4.0–10.5)

## 2011-01-19 LAB — CK TOTAL AND CKMB (NOT AT ARMC)
CK, MB: 2.1 ng/mL (ref 0.3–4.0)
Relative Index: 2.1 (ref 0.0–2.5)
Total CK: 100 U/L (ref 7–177)

## 2011-01-19 LAB — LIPASE, BLOOD: Lipase: 41 U/L (ref 11–59)

## 2011-01-19 LAB — TROPONIN I: Troponin I: <0.3 ng/mL (ref <0.30)

## 2011-01-20 ENCOUNTER — Ambulatory Visit: Payer: Self-pay | Admitting: Internal Medicine

## 2011-01-24 NOTE — H&P (Signed)
NAME:  [name], [name]                ACCOUNT NO.:  [id number]   MEDICAL RECORD NO.:  [id number]          PATIENT TYPE:  INP   LOCATION:  1232                         FACILITY:  [facility]   PHYSICIAN:  Della Goo, M.D. DATE OF BIRTH:  02/05/1961   DATE OF ADMISSION:  12/06/2008  DATE OF DISCHARGE:                              HISTORY & PHYSICAL   PRIMARY CARE PHYSICIAN:  Unassigned   CHIEF COMPLAINT:  Nausea, vomiting, fever, chills.   HISTORY OF PRESENT ILLNESS:  This is a 50 year old female who [facility], evaluated and  referred for admission to [facility] after being seen for  complaints of severe nausea and vomiting with 6 episodes of vomiting  which occurred 1 hour after eating.  The patient reports eating out with  her husband and having salad at an area fast food restaurant.  Her  husband denies being ill.  She states she began to have fevers, chills  along with nausea followed by vomiting.  She reports having epigastric  abdominal pain.  She denies having any diarrhea.  She denies having any  constipation symptoms, any hematochezia, hematemesis or melena passage.  The patient does report having increased weakness, mild lightheadedness.  Denies having any chest pain and denies having shortness of breath.  She  does report having increased mucus production and coughing.  However,  she states that she has this chronically.   PAST MEDICAL HISTORY:  1. Asthma.  2. Type 2 diabetes mellitus.  3. Fibrocystic breast disease.  4. Hypertension.  5. Hyperthyroidism.  6. 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  7. Chronic pain syndrome/fibromyalgia.  8. Eczema.   PAST SURGICAL HISTORY:  1. History of tonsillectomy and adenoidectomy.  2. Hysterectomy.  3. Cholecystectomy.  4. Appendectomy.   MEDICATIONS:  NPH insulin and sliding scale regular insulin, enalapril,  Januvia, metformin, Phenergan, Proventil, Synthroid,  albuterol nebulizer  treatments and aspirin therapy.   ALLERGIES:  To PENICILLIN.   SOCIAL HISTORY:  The patient is married.  She is a nonsmoker.  She  reports drinking one glass of wine on occasion.   FAMILY HISTORY:  Positive for diabetic disease.   REVIEW OF SYSTEMS:  Pertinents are mentioned above.  All other organ  systems are negative except the patient has chronic pain in her joints  and muscles for the past 12 years.   PHYSICAL EXAMINATION:  GENERAL:  This is a morbidly obese 50 year old  female who is in discomfort but no acute distress.  VITAL SIGNS: Temperature 102.3, now 98.6.  Blood pressures ranging from  153/66 to 102/75, heart rate 135-148 and respirations 22-24, O2  saturations 95% to 98%.  HEENT EXAMINATION:  Normocephalic, atraumatic.  There is no scleral  icterus.  Pupils are equally round, react to light.  Extraocular  movements are intact.  Funduscopic benign.  Nares patent bilaterally.  Oropharynx clear.  NECK:  Supple full range of motion.  No thyromegaly, adenopathy, jugular  venous distention.  CARDIOVASCULAR:  Tachycardiac rate and rhythm.  No murmurs, gallops or  rubs.  LUNGS:  Clear  to auscultation bilaterally.  No rales, rhonchi or  wheezes.  ABDOMEN:  Positive bowel sounds, soft, nontender, nondistended.  There  is no hepatosplenomegaly.  EXTREMITIES:  Without cyanosis, clubbing or edema.  There are multiple  discrete ulcers on both lower extremities in the distal aspects.  Each  of these ulcers has an erythematous base.  Please note the patient  reports that they started off as blister-like areas that drained  purulent exudate.  NEUROLOGIC EXAMINATION:  Generalized weakness.  The patient is alert and  oriented x3.  She is able to move all 4 of her extremities.  There are  no sensory or motor deficits.   LABORATORY STUDIES:  White blood cell count 23.9, hemoglobin 16.8,  hematocrit 50.6, MCV 88.0, platelets 280, neutrophils 88% lymphocytes  5%.   Sodium 136, potassium 5.3, chloride 96, carbon dioxide 22.  BUN 10,  creatinine 0.8 and glucose 219.  Liver function tests with an albumin of  5.0, AST 332, ALT 264, alkaline phosphatase 70, total bilirubin 1.6.  Lipase level 103 and the range is 23-300.  Urinalysis is negative except  for 100 mg/dL glucose and 782 mg/dL of protein.  Chest x-ray reveals  mild peribronchial thickening but no acute disease process.  CT scan of  the abdomen and pelvis revealed mild diffuse fatty liver changes;  normal spleen, pancreas, adrenal glands and kidneys; no biliary ductal  dilatation seen.  No adenopathy, no ascites.  Surgical changes  consistent with cholecystectomy.   ASSESSMENT:  A 50 year old female being admitted with:  1. Febrile illness, possible sepsis.  2. Nausea, vomiting.  3. Abdominal pain.  4. Elevated transaminases.  5. Sinus tachycardia.  6. Leukocytosis.  7. Asthma.  8. Type 2 diabetes mellitus.  9. Chronic pain syndrome.  10.Leukocytosis.  11.Impetigo.   PLAN:  The patient will be admitted to the step-down ICU area.  She has  been placed in both MRSA precautions and H1N1 precautions.  Blood  cultures had been sent by the EDP at the [facility] and  antibiotic therapy has already been started with ciprofloxacin and  Flagyl to cover for an abdominal process.  Vancomycin therapy will also  be added secondary to the patient's pustular lesions of both lower  extremities.  The patient will also be placed on Tamiflu therapy in the  event that this is H1H1 infection.  Nebulizer treatments and oxygen have  also been ordered as needed.  The patient will be placed on antiemetics  and antipyretics as needed as well along with pain control therapy.  The  patient has also been started on IV fluids for fluid resuscitation.  DVT  and GI prophylaxis have been initiated.  A hepatitis panel will be  checked secondary to the patient's elevated transaminases.  A lactic  acid level  also be checked as well.  The patient's metformin therapy  will be held at this time.  Sliding scale insulin coverage has also been  ordered for elevated blood sugars.  An abdominal ultrasound study will  be ordered as well to evaluate further for a possible retained stone in  the common bile duct or biliary tract.  Further workup will ensue  pending results of the patient's clinical course and her studies.      Della Goo, M.D.  Electronically Signed     HJ/MEDQ  D:  12/06/2008  T:  12/06/2008  Job:  [id number]

## 2011-01-24 NOTE — Discharge Summary (Signed)
NAME:  [name], [name]                    ACCOUNT NO.:  [id number]   MEDICAL RECORD NO.:  [id number]          PATIENT TYPE:  INP   LOCATION:  1402                         FACILITY:  [facility]   PHYSICIAN:  Monte Fantasia, MD  DATE OF BIRTH:  08-30-1961   DATE OF ADMISSION:  12/06/2008  DATE OF DISCHARGE:  12/12/2008                               DISCHARGE SUMMARY   PRIMARY CARE PHYSICIAN:  Dr. Maye Hides. Fax number is [phone].   DISCHARGE DIAGNOSES:  1. Necrobiosis lipoidica.  2. Left leg cellulitis.  3. Hypothyroidism.  4. Diabetes which is uncontrolled.  5. Hypertension.  Blood pressure controlled.  6. Asthma exacerbation which is controlled now.  7. Leukocytosis, resolved.  8. Viral gastroenteritis which is resolved.  9. Chronic pain syndrome.   MEDICATIONS UPON DISCHARGE:  1. NPH 60 units subcutaneous q.12 h.  2. NovoLog 15 units subcutaneous 3 times a day with each meal.  3. Lasix 20 mg p.o. q.12 h.  4. Norvasc 10 mg p.o. q.h.s.  5. Enalapril 20 mg p.o. daily.  6. Metformin 1000 mg p.o. daily.  7. Januvia 50 mg p.o. daily.  8. Percocet 10/325 mg p.o. q.6 h. p.r.n. pain.  9. Synthroid 200 mcg p.o. daily.  10.Proventil 90 mcg inhalations 2 puffs q.6 h.  11.Guaifenesin 600 mg p.o. q.12 h.  12.Prednisone 40 mg p.o. daily to taper by 20 mg every day to      discontinue.  13.Norvasc 10 mg p.o. daily.   COURSE DURING THE HOSPITAL STAY:  The patient is a 50 year old Caucasian  lady.  The patient was admitted on from [facility] for  complaints of nausea, vomiting, fever and chills.  The patient initially  during the stay in the hospital on admission was started on  ciprofloxacin and Flagyl for abdominal processes.  The patient had a  high white count of 23.9, was placed on H1N1 precautions.  Also, because  of shortness of breath and productive cough she was initially started on  Tamiflu therapy and completed with 5 days of treatment for the same.  The patient was  also given bronchodilators and placed on oxygen therapy  as needed.  The patient improved well during the stay in the hospital  with her nausea, vomiting and fever with abdominal pain.  The patient  had mild elevation of transaminases on admission which were recovered  well.   The patient had pustular lesions with erythema and swelling of her left  leg.  The patient initially was started on vancomycin on admission in  view of her pustular lesions and blood cultures were drawn.  The blood  cultures on admission have been no growth to date for 5 days.  ID  consult was also called in and vancomycin was continued in view of left  leg cellulitis.  MRI of the left foot was considered in view of for  ruling one abscess and the second is necrotizing fascitis, however, the  patient was unable to undergo MRI due to anxiety in spite of sedation  and the patient improved well gradually with  her cellulitis and  impetigo.  The patient has completed a course of vancomycin for total of  7 days and in the interim Cipro and Flagyl was also DC'd.   The patient did complain of increasing shortness of breath and stated  that her asthma does act up sometimes.  The patient received  bronchodilators and was given IV steroids for the same.  At present, the  patient is on oral steroids and in tapering doses and the asthma is now  at present well controlled.   Diabetes:  The patient's blood sugars have been initially controlled,  but secondary to steroids NPH insulin requirements have increased to  presently 50 mg b.i.d. and 15 units subcutaneous 3 times a day NovoLog 3  times with each meal.  The patient needs to follow up as an outpatient  with the primary care physician for better controlled of the sugars.  Metformin and Januvia has been continued and the patient can be  continued on his metformin too.   Blood pressure has been controlled on Vasotec and Norvasc has been added  to the regimen.  Lasix was  also was started due to her bilateral edema  of the feet.   The patient has been continued on her Synthroid dose for her  hypothyroidism.   The patient at present is medically stable to be discharged and is  recommended to follow up as an outpatient with the primary care  physician.   LABS UPON DISCHARGE:  Total WBC 9.0, hemoglobin 12.8, hematocrit 38.6,  platelets of 268, sodium 140, potassium 3.5, chloride 104, bicarb 29,  glucose 175, BUN 3, creatinine 0.9.  C diff toxin has been negative.  __________ferritin negative.  Blood cultures x2 sets done on March 28  have been no growth for 5 days.   RADIOLOGICAL INVESTIGATIONS DONE DURING THE STAY IN THE HOSPITAL:  Chest  x-ray done on December 06, 2008, impression:  No acute cardiopulmonary  disease, mild peribronchial thickening may relate to chronic bronchitis.  CT abdomen and pelvis done on December 06, 2008.  CT abdomen, impression:  Mild diffuse fatty infiltration of liver without focal hepatic  abnormality.  No active acute abnormalities otherwise noted in the  abdomen.  CT pelvis impression:  Approximately 8 x 6 cm left ovarian  cyst.  No acute abnormalities in the pelvis.  Ultrasound of the abdomen  done on December 06, 2008, impression:  Status post cholecystectomy,  increased caliber of common bile duct without intrahepatic biliary  dilatation, fatty infiltration of the liver.   DISPOSITION:  The patient at present is medically stable to be  discharged and has completed the course of vancomycin for total of 7  days for her left leg cellulitis as per the ID recommendations.  The  patient's leg edema and  swelling has improved very well through the stay in the hospital.  The  patient is recommended to follow up with the primary care physician in  the next 1-2 weeks.   Total time for discharge is 50 minutes.      Monte Fantasia, MD     MP/MEDQ  D:  12/12/2008  T:  12/12/2008  Job:  [id number]   cc:   Maye Hides, M.D.   [phone]

## 2011-01-24 NOTE — Discharge Summary (Signed)
NAME:  [name], [name]                ACCOUNT NO.:  [id number]   MEDICAL RECORD NO.:  [id number]          PATIENT TYPE:  INP   LOCATION:  1402                         FACILITY:  [facility]   PHYSICIAN:  Monte Fantasia, MD  DATE OF BIRTH:  12-25-1960   DATE OF ADMISSION:  12/06/2008  DATE OF DISCHARGE:  12/12/2008                               DISCHARGE SUMMARY   PRIMARY CARE PHYSICIAN:  Dr. Maye Hides. Fax number is [phone].   DISCHARGE DIAGNOSES:  1. Necrobiosis lipoidica.  2. Left leg cellulitis.  3. Hypothyroidism.  4. Diabetes which is uncontrolled.  5. Hypertension.  Blood pressure controlled.  6. Asthma exacerbation which is controlled now.  7. Leukocytosis, resolved.  8. Viral gastroenteritis which is resolved.  9. Chronic pain syndrome.   MEDICATIONS UPON DISCHARGE:  1. NPH 60 units subcutaneous q.12 h.  2. NovoLog 15 units subcutaneous 3 times a day with each meal.  3. Lasix 20 mg p.o. q.12 h.  4. Norvasc 10 mg p.o. q.h.s.  5. Enalapril 20 mg p.o. daily.  6. Metformin 1000 mg p.o. daily.  7. Januvia 50 mg p.o. daily.  8. Percocet 10/325 mg p.o. q.6 h. p.r.n. pain.  9. Synthroid 200 mcg p.o. daily.  10.Proventil 90 mcg inhalations 2 puffs q.6 h.  11.Guaifenesin 600 mg p.o. q.12 h.  12.Prednisone 40 mg p.o. daily to taper by 20 mg every day to      discontinue.  13.Norvasc 10 mg p.o. daily.   COURSE DURING THE HOSPITAL STAY:  The patient is a 50 year old Caucasian  lady.  The patient was admitted on from [facility] for  complaints of nausea, vomiting, fever and chills.  The patient initially  during the stay in the hospital on admission was started on  ciprofloxacin and Flagyl for abdominal processes.  The patient had a  high white count of 23.9, was placed on H1N1 precautions.  Also, because  of shortness of breath and productive cough she was initially started on  Tamiflu therapy and completed with 5 days of treatment for the same.  The patient was  also given bronchodilators and placed on oxygen therapy  as needed.  The patient improved well during the stay in the hospital  with her nausea, vomiting and fever with abdominal pain.  The patient  had mild elevation of transaminases on admission which were recovered  well.   The patient had pustular lesions with erythema and swelling of her left  leg.  The patient initially was started on vancomycin on admission in  view of her pustular lesions and blood cultures were drawn.  The blood  cultures on admission have been no growth to date for 5 days.  ID  consult was also called in and vancomycin was continued in view of left  leg cellulitis.  MRI of the left foot was considered in view of for  ruling one abscess and the second is necrotizing fascitis, however, the  patient was unable to undergo MRI due to anxiety in spite of sedation  and the patient improved well gradually with her cellulitis and  impetigo.  The patient has completed a course of vancomycin for total of  7 days and in the interim Cipro and Flagyl was also DC'd.   The patient did complain of increasing shortness of breath and stated  that her asthma does act up sometimes.  The patient received  bronchodilators and was given IV steroids for the same.  At present, the  patient is on oral steroids and in tapering doses and the asthma is now  at present well controlled.   Diabetes:  The patient's blood sugars have been initially controlled,  but secondary to steroids NPH insulin requirements have increased to  presently 50 mg b.i.d. and 15 units subcutaneous 3 times a day NovoLog 3  times with each meal.  The patient needs to follow up as an outpatient  with the primary care physician for better controlled of the sugars.  Metformin and Januvia has been continued and the patient can be  continued on his metformin too.   Blood pressure has been controlled on Vasotec and Norvasc has been added  to the regimen.  Lasix was  also was started due to her bilateral edema  of the feet.   The patient has been continued on her Synthroid dose for her  hypothyroidism.   The patient at present is medically stable to be discharged and is  recommended to follow up as an outpatient with the primary care  physician.   LABS UPON DISCHARGE:  Total WBC 9.0, hemoglobin 12.8, hematocrit 38.6,  platelets of 268, sodium 140, potassium 3.5, chloride 104, bicarb 29,  glucose 175, BUN 3, creatinine 0.9.  C diff toxin has been negative.  Blood cultures x2 sets done on March 28 have been no growth for 5 days.   RADIOLOGICAL INVESTIGATIONS DONE DURING THE STAY IN THE HOSPITAL:  Chest  x-ray done on December 06, 2008, impression:  No acute cardiopulmonary  disease, mild peribronchial thickening may relate to chronic bronchitis.  CT abdomen and pelvis done on December 06, 2008.  CT abdomen, impression:  Mild diffuse fatty infiltration of liver  without focal hepatic abnormality.  No active acute abnormalities  otherwise noted in the abdomen.  CT pelvis impression:  Approximately 8 x 6 cm left ovarian cyst.  No  acute abnormalities in the pelvis.  Ultrasound of the abdomen done on December 06, 2008, impression:  Status  post cholecystectomy, increased caliber of common bile duct without  intrahepatic biliary dilatation, fatty infiltration of the liver.   DISPOSITION:  The patient at present is medically stable to be  discharged and has completed the course of vancomycin for total of 7  days for her left leg cellulitis as per the ID recommendations.  The  patient's leg edema and  swelling has improved very well through the stay in the hospital.  The  patient is recommended to follow up with the primary care physician in  the next 1-2 weeks.   Total time for discharge is 50 minutes.      Monte Fantasia, MD  Electronically Signed     MP/MEDQ  D:  12/12/2008  T:  12/12/2008  Job:  [id number]   cc:   Maye Hides, M.D.  [phone]

## 2011-06-06 ENCOUNTER — Encounter: Payer: Self-pay | Admitting: *Deleted

## 2011-06-06 ENCOUNTER — Emergency Department (INDEPENDENT_AMBULATORY_CARE_PROVIDER_SITE_OTHER): Payer: PRIVATE HEALTH INSURANCE

## 2011-06-06 ENCOUNTER — Emergency Department (HOSPITAL_BASED_OUTPATIENT_CLINIC_OR_DEPARTMENT_OTHER)
Admission: EM | Admit: 2011-06-06 | Discharge: 2011-06-06 | Disposition: A | Discharge status: Home / Self Care | Payer: PRIVATE HEALTH INSURANCE | Attending: Emergency Medicine | Admitting: Emergency Medicine

## 2011-06-06 ENCOUNTER — Other Ambulatory Visit: Payer: Self-pay

## 2011-06-06 DIAGNOSIS — Z79899 Other long term (current) drug therapy: Secondary | ICD-10-CM | POA: Insufficient documentation

## 2011-06-06 DIAGNOSIS — R0789 Other chest pain: Secondary | ICD-10-CM

## 2011-06-06 DIAGNOSIS — J45909 Unspecified asthma, uncomplicated: Secondary | ICD-10-CM | POA: Insufficient documentation

## 2011-06-06 DIAGNOSIS — R188 Other ascites: Secondary | ICD-10-CM

## 2011-06-06 DIAGNOSIS — R079 Chest pain, unspecified: Secondary | ICD-10-CM | POA: Insufficient documentation

## 2011-06-06 DIAGNOSIS — M549 Dorsalgia, unspecified: Secondary | ICD-10-CM | POA: Insufficient documentation

## 2011-06-06 DIAGNOSIS — K589 Irritable bowel syndrome without diarrhea: Secondary | ICD-10-CM | POA: Insufficient documentation

## 2011-06-06 DIAGNOSIS — R071 Chest pain on breathing: Secondary | ICD-10-CM | POA: Insufficient documentation

## 2011-06-06 DIAGNOSIS — E079 Disorder of thyroid, unspecified: Secondary | ICD-10-CM | POA: Insufficient documentation

## 2011-06-06 DIAGNOSIS — E119 Type 2 diabetes mellitus without complications: Secondary | ICD-10-CM | POA: Insufficient documentation

## 2011-06-06 DIAGNOSIS — I10 Essential (primary) hypertension: Secondary | ICD-10-CM | POA: Insufficient documentation

## 2011-06-06 HISTORY — DX: Gastro-esophageal reflux disease without esophagitis: K21.9

## 2011-06-06 HISTORY — DX: Essential (primary) hypertension: I10

## 2011-06-06 HISTORY — DX: Irritable bowel syndrome, unspecified: K58.9

## 2011-06-06 HISTORY — DX: Dermatitis, unspecified: L30.9

## 2011-06-06 LAB — COMPREHENSIVE METABOLIC PANEL
ALT: 67 U/L — ABNORMAL HIGH (ref 0–35)
AST: 47 U/L — ABNORMAL HIGH (ref 0–37)
Albumin: 3.7 g/dL (ref 3.5–5.2)
Alkaline Phosphatase: 81 U/L (ref 39–117)
BUN: 11 mg/dL (ref 6–23)
CO2: 28 mEq/L (ref 19–32)
Calcium: 10.6 mg/dL — ABNORMAL HIGH (ref 8.4–10.5)
Chloride: 98 mEq/L (ref 96–112)
Creatinine, Ser: 0.6 mg/dL (ref 0.50–1.10)
GFR calc Af Amer: >60 mL/min (ref >60)
GFR calc non Af Amer: >60 mL/min (ref >60)
Glucose, Bld: 156 mg/dL — ABNORMAL HIGH (ref 70–99)
Potassium: 3.9 mEq/L (ref 3.5–5.1)
Sodium: 138 mEq/L (ref 135–145)
Total Bilirubin: 0.4 mg/dL (ref 0.3–1.2)
Total Protein: 7.5 g/dL (ref 6.0–8.3)

## 2011-06-06 LAB — APTT: aPTT: 34 seconds (ref 24–37)

## 2011-06-06 LAB — CARDIAC PANEL(CRET KIN+CKTOT+MB+TROPI)
CK, MB: 2.6 ng/mL (ref 0.3–4.0)
Relative Index: INVALID (ref 0.0–2.5)
Total CK: 84 U/L (ref 7–177)
Troponin I: <0.3 ng/mL (ref <0.30)

## 2011-06-06 LAB — DIFFERENTIAL
Basophils Absolute: 0 10*3/uL (ref 0.0–0.1)
Basophils Relative: 1 % (ref 0–1)
Eosinophils Absolute: 0.3 10*3/uL (ref 0.0–0.7)
Eosinophils Relative: 3 % (ref 0–5)
Lymphocytes Relative: 42 % (ref 12–46)
Lymphs Abs: 3.6 10*3/uL (ref 0.7–4.0)
Monocytes Absolute: 0.7 10*3/uL (ref 0.1–1.0)
Monocytes Relative: 8 % (ref 3–12)
Neutro Abs: 4 10*3/uL (ref 1.7–7.7)
Neutrophils Relative %: 47 % (ref 43–77)

## 2011-06-06 LAB — CBC
HCT: 43.8 % (ref 36.0–46.0)
Hemoglobin: 14.5 g/dL (ref 12.0–15.0)
MCH: 29 pg (ref 26.0–34.0)
MCHC: 33.1 g/dL (ref 30.0–36.0)
MCV: 87.6 fL (ref 78.0–100.0)
Platelets: 167 10*3/uL (ref 150–400)
RBC: 5 MIL/uL (ref 3.87–5.11)
RDW: 13.4 % (ref 11.5–15.5)
WBC: 8.6 10*3/uL (ref 4.0–10.5)

## 2011-06-06 LAB — LIPASE, BLOOD: Lipase: 43 U/L (ref 11–59)

## 2011-06-06 LAB — PROTIME-INR
INR: 0.98 (ref 0.00–1.49)
Prothrombin Time: 13.2 seconds (ref 11.6–15.2)

## 2011-06-06 LAB — D-DIMER, QUANTITATIVE: D-Dimer, Quant: 0.35 ug/mL-FEU (ref 0.00–0.48)

## 2011-06-06 MED ORDER — KETOROLAC TROMETHAMINE 30 MG/ML IJ SOLN
30.0000 mg | Freq: Once | INTRAMUSCULAR | Status: AC
  Administered 2011-06-06: 30 mg via INTRAVENOUS
  Filled 2011-06-06: qty 1

## 2011-06-06 MED ORDER — FENTANYL CITRATE 0.05 MG/ML IJ SOLN
50.0000 ug | Freq: Once | INTRAMUSCULAR | Status: AC
  Administered 2011-06-06: 50 ug via INTRAVENOUS
  Filled 2011-06-06: qty 2

## 2011-06-06 NOTE — ED Notes (Signed)
MD at bedside. 

## 2011-06-06 NOTE — ED Notes (Signed)
Pt c/o chest pain while sitting, SOB only.  HX chronic pain  Pt also states cp with palp and movt

## 2011-06-06 NOTE — ED Provider Notes (Addendum)
History     CSN: [id number] Arrival date & time: 06/06/2011  7:41 PM  Chief Complaint  Patient presents with  . Chest Pain  . Back Pain    HPI  (Consider location/radiation/quality/duration/timing/severity/associated sxs/prior treatment)  Patient is a 50 y.o. female presenting with chest pain and back pain. The history is provided by the patient.  Chest Pain The chest pain began 6 - 12 hours ago. Chest pain occurs constantly. The chest pain is unchanged. The pain is associated with coughing and breathing. At its most intense, the pain is at 10/10. The pain is currently at 2/10. The quality of the pain is described as stabbing. The pain radiates to the left neck, left shoulder and upper back. Chest pain is worsened by certain positions (Patient states it worsens with any movement of upper body.). Primary symptoms include shortness of breath, cough and wheezing. Pertinent negatives for primary symptoms include no fever, no fatigue, no nausea and no vomiting.  Pertinent negatives for associated symptoms include no diaphoresis, no lower extremity edema, no near-syncope, no numbness, no orthopnea and no weakness. Treatments tried: Patient took normal oxycodone without relief. Risk factors include obesity, sedentary lifestyle and post-menopausal.  Her past medical history is significant for diabetes, hyperlipidemia and hypertension.  Pertinent negatives for past medical history include no DVT and no PE.  Her family medical history is significant for early MI in family.  Procedure history is positive for persantine thallium.    Back Pain  Associated symptoms include chest pain. Pertinent negatives include no fever, no numbness and no weakness.    Past Medical History  Diagnosis Date  . Asthma   . Hypertension   . Diabetes mellitus   . IBS (irritable bowel syndrome)   . GERD (gastroesophageal reflux disease)   . Eczema   . Thyroid disease     Past Surgical History  Procedure Date  .  Breast surgery   . Cholecystectomy   . Abdominal hysterectomy     History reviewed. No pertinent family history.  History  Substance Use Topics  . Smoking status: Never Smoker   . Smokeless tobacco: Not on file  . Alcohol Use: No    OB History    Grav Para Term Preterm Abortions TAB SAB Ect Mult Living                  Review of Systems  Review of Systems  Constitutional: Negative for fever, diaphoresis and fatigue.  Respiratory: Positive for cough, shortness of breath and wheezing.   Cardiovascular: Positive for chest pain. Negative for orthopnea and near-syncope.  Gastrointestinal: Negative for nausea and vomiting.  Musculoskeletal: Positive for back pain.  Neurological: Negative for weakness and numbness.  All other systems reviewed and are negative.    Allergies  Fish allergy; Gabapentin; Peanut-containing drug products; Atrovent; Combivent; and Penicillins  Home Medications   Current Outpatient Rx  Name Route Sig Dispense Refill  . ASPIRIN 81 MG PO TBEC Oral Take 81 mg by mouth daily.      Marland Kitchen BROCCOLI EXTRACT PO Oral Take 2 tablets by mouth at bedtime.      Marland Kitchen CLOTRIMAZOLE-BETAMETHASONE 1-0.05 % EX CREA Topical Apply topically 2 (two) times daily.      Marland Kitchen ESTROGENS, CONJUGATED 0.625 MG/GM VA CREA Vaginal Place 1 g vaginally 3 (three) times a week.      . DEXLANSOPRAZOLE 60 MG PO CPDR Oral Take 60 mg by mouth daily.      . ENALAPRIL MALEATE 20  MG PO TABS Oral Take 20 mg by mouth daily.      . FENTANYL 100 MCG/HR TD PT72 Transdermal Place 1 patch onto the skin every 3 (three) days.      Marland Kitchen FLUTICASONE PROPIONATE 50 MCG/ACT NA SUSP Nasal Place 2 sprays into the nose 2 (two) times daily.      Marland Kitchen GABAPENTIN 100 MG PO CAPS Oral Take 200 mg by mouth 2 (two) times daily.      . GUAIFENESIN 600 MG PO TB12 Oral Take 1,200 mg by mouth 2 (two) times daily.      . INSULIN REGULAR HUMAN (CONC) 500 UNIT/ML [place] SOLN Subcutaneous Inject 16 Units into the skin 3 (three) times daily with  meals.      Marland Kitchen LEVALBUTEROL TARTRATE 45 MCG/ACT IN AERO Inhalation Inhale 1-2 puffs into the lungs 3 (three) times daily.      Marland Kitchen LEVALBUTEROL HCL 1.25 MG/0.5ML IN NEBU Nebulization Take 1 ampule by nebulization 3 (three) times daily as needed. Shortness of breath and wheezing     . LEVOTHYROXINE SODIUM 200 MCG PO TABS Oral Take 200 mcg by mouth daily.      Marland Kitchen LEVOTHYROXINE SODIUM 25 MCG PO TABS Oral Take 50 mcg by mouth daily.      Marland Kitchen METFORMIN HCL 1000 MG PO TABS Oral Take 1,000 mg by mouth 2 (two) times daily with a meal.      . METOCLOPRAMIDE HCL 10 MG PO TABS Oral Take 10 mg by mouth 2 (two) times daily.      Marland Kitchen ONE-DAILY MULTI VITAMINS PO TABS Oral Take 1 tablet by mouth daily.      Marland Kitchen VITAMINS A-D-E/SELENIUM PO TABS Oral Take 2 tablets by mouth daily.      Marland Kitchen NIACIN (ANTIHYPERLIPIDEMIC) 500 MG PO TBCR Oral Take 1,000 mg by mouth at bedtime.      . OXYCODONE HCL 10 MG PO TB12 Oral Take 10 mg by mouth every 4 (four) hours.      Marland Kitchen OXYMETAZOLINE HCL 0.05 % NA SOLN Nasal Place 2 sprays into the nose 2 (two) times daily.      Marland Kitchen PROMETHAZINE HCL 25 MG PO TABS Oral Take 12.5-25 mg by mouth every 8 (eight) hours as needed. Nausea      . ROSUVASTATIN CALCIUM 40 MG PO TABS Oral Take 40 mg by mouth daily.      . SUCRALFATE 1 G PO TABS Oral Take 1 g by mouth 2 (two) times daily.      Marland Kitchen VITAMIN B-12 1000 MCG PO TABS Oral Take 1,000 mcg by mouth daily.      Marland Kitchen VITAMIN D (ERGOCALCIFEROL) 50000 UNITS PO CAPS Oral Take 50,000 Units by mouth every 7 (seven) days.      Marland Kitchen PHENYLEPHRINE HCL 0.25 % NA SOLN Nasal Place 1 spray into the nose every 4 (four) hours as needed. Nasal congestion        Physical Exam    BP 129/77  Pulse 91  Temp 98.6 F (37 C)  Resp 16  Ht 5\' 2"  (1.575 m)  Wt 280 lb (127.007 kg)  BMI 51.21 kg/m2  SpO2 100%  Physical Exam   Morbidly obese female sitting in the bed does not appear to be in any acute distress vital signs are reviewed and are normal HEENT normocephalic atraumatic  eyes pupils are equal round reactive to light extraocular movements are intact conjunctiva are pink nares are patent mouth mucous membranes are moist oropharynx is clear external ears  are normal TMs are clear Neck trachea is midline carotid pulses are 2+ no bruits are noted it is supple Chest wall tender to palpation along the left pubic junction. This reproduces the pain. Lungs are clear auscultation. Heart is regular rate and rhythm. Abdomen is soft and nontender. Extremities show no signs of focal laying and no focal tenderness. Neurologically she is alert and oriented x3 strength appears equal throughout Psychiatry after fully patient appears to have no abnormalities. ED Course  Procedures (including critical care time)  Labs Reviewed - No data to display Dg Chest 2 View  06/06/2011  *RADIOLOGY REPORT*  Clinical Data: Mid chest pain.  Some shortness of breath.  History of asthma.  CHEST - 2 VIEW  Comparison: Chest radiograph 01/19/2011  Findings: Heart size is normal in stable.  Thoracic aorta and hilar contours are stable and within normal limits.  Bilateral peribronchial thickening appears similar to prior examination. Azygos fissure, a normal variant, is again noted.  No focal airspace disease, edema, effusion, or pneumothorax.  The bony thorax is unremarkable.  IMPRESSION: 1.  Bilateral peribronchial thickening.  This can be seen in the setting of acute or chronic bronchitis, asthma, or smoking related changes. 2.  No acute findings compared to prior study of 2012.  Original Report Authenticated By: Britta Mccreedy, M.D.     No diagnosis found.   MDM  Date: 06/06/2011  Rate: 90  Rhythm: normal sinus rhythm  QRS Axis: left  Intervals: normal  ST/T Wave abnormalities: inferior q waves- no change from prior  Conduction Disutrbances:none  Narrative Interpretation:   Old EKG Reviewed: unchanged         Hilario Quarry, MD 06/06/11 1610  Hilario Quarry, MD 06/19/11 1124

## 2011-06-06 NOTE — ED Notes (Signed)
Pt presents to ED today with  CP that began upon waking this am.  Pt describes as pressure radiaiting across shoulders and back and neck.  Pt reports no obvious injury or extra exertion.  Pt rates at present 2/10 with increase to 10/10 with movement and exertion.

## 2011-06-06 NOTE — ED Notes (Signed)
Family at bedside. 

## 2011-07-17 DIAGNOSIS — R768 Other specified abnormal immunological findings in serum: Secondary | ICD-10-CM | POA: Insufficient documentation

## 2012-02-20 DIAGNOSIS — M358 Other specified systemic involvement of connective tissue: Secondary | ICD-10-CM | POA: Insufficient documentation

## 2012-02-20 DIAGNOSIS — M351 Other overlap syndromes: Secondary | ICD-10-CM | POA: Insufficient documentation

## 2012-02-20 DIAGNOSIS — M3589 Other specified systemic involvement of connective tissue: Secondary | ICD-10-CM | POA: Insufficient documentation

## 2012-02-28 ENCOUNTER — Encounter (HOSPITAL_BASED_OUTPATIENT_CLINIC_OR_DEPARTMENT_OTHER): Payer: Self-pay | Admitting: Family Medicine

## 2012-02-28 ENCOUNTER — Emergency Department (HOSPITAL_BASED_OUTPATIENT_CLINIC_OR_DEPARTMENT_OTHER): Payer: PRIVATE HEALTH INSURANCE

## 2012-02-28 ENCOUNTER — Emergency Department (HOSPITAL_BASED_OUTPATIENT_CLINIC_OR_DEPARTMENT_OTHER)
Admission: EM | Admit: 2012-02-28 | Discharge: 2012-02-28 | Disposition: A | Discharge status: Home / Self Care | Payer: PRIVATE HEALTH INSURANCE | Attending: Emergency Medicine | Admitting: Emergency Medicine

## 2012-02-28 DIAGNOSIS — K219 Gastro-esophageal reflux disease without esophagitis: Secondary | ICD-10-CM | POA: Insufficient documentation

## 2012-02-28 DIAGNOSIS — M940 Chondrocostal junction syndrome [Tietze]: Secondary | ICD-10-CM | POA: Insufficient documentation

## 2012-02-28 DIAGNOSIS — Z794 Long term (current) use of insulin: Secondary | ICD-10-CM | POA: Insufficient documentation

## 2012-02-28 DIAGNOSIS — E079 Disorder of thyroid, unspecified: Secondary | ICD-10-CM | POA: Insufficient documentation

## 2012-02-28 DIAGNOSIS — E119 Type 2 diabetes mellitus without complications: Secondary | ICD-10-CM | POA: Insufficient documentation

## 2012-02-28 DIAGNOSIS — I10 Essential (primary) hypertension: Secondary | ICD-10-CM | POA: Insufficient documentation

## 2012-02-28 DIAGNOSIS — Z79899 Other long term (current) drug therapy: Secondary | ICD-10-CM | POA: Insufficient documentation

## 2012-02-28 HISTORY — DX: Other overlap syndromes: M35.1

## 2012-02-28 LAB — BASIC METABOLIC PANEL
BUN: 9 mg/dL (ref 6–23)
CO2: 28 mEq/L (ref 19–32)
Calcium: 9.6 mg/dL (ref 8.4–10.5)
Chloride: 98 mEq/L (ref 96–112)
Creatinine, Ser: 0.7 mg/dL (ref 0.50–1.10)
GFR calc Af Amer: >90 mL/min (ref >90)
GFR calc non Af Amer: >90 mL/min (ref >90)
Glucose, Bld: 195 mg/dL — ABNORMAL HIGH (ref 70–99)
Potassium: 4 mEq/L (ref 3.5–5.1)
Sodium: 137 mEq/L (ref 135–145)

## 2012-02-28 LAB — DIFFERENTIAL
Basophils Absolute: 0.1 10*3/uL (ref 0.0–0.1)
Basophils Relative: 1 % (ref 0–1)
Eosinophils Absolute: 0.3 10*3/uL (ref 0.0–0.7)
Eosinophils Relative: 5 % (ref 0–5)
Lymphocytes Relative: 45 % (ref 12–46)
Lymphs Abs: 3 10*3/uL (ref 0.7–4.0)
Monocytes Absolute: 0.6 10*3/uL (ref 0.1–1.0)
Monocytes Relative: 8 % (ref 3–12)
Neutro Abs: 2.8 10*3/uL (ref 1.7–7.7)
Neutrophils Relative %: 42 % — ABNORMAL LOW (ref 43–77)

## 2012-02-28 LAB — TROPONIN I: Troponin I: <0.3 ng/mL (ref <0.30)

## 2012-02-28 LAB — CBC
HCT: 41 % (ref 36.0–46.0)
Hemoglobin: 13.8 g/dL (ref 12.0–15.0)
MCH: 29.1 pg (ref 26.0–34.0)
MCHC: 33.7 g/dL (ref 30.0–36.0)
MCV: 86.5 fL (ref 78.0–100.0)
Platelets: 176 10*3/uL (ref 150–400)
RBC: 4.74 MIL/uL (ref 3.87–5.11)
RDW: 13.4 % (ref 11.5–15.5)
WBC: 6.7 10*3/uL (ref 4.0–10.5)

## 2012-02-28 NOTE — ED Provider Notes (Signed)
History     CSN: [id number]  Arrival date & time 02/28/12  4098   First MD Initiated Contact with Patient 02/28/12 [phone]      Chief Complaint  Patient presents with  . Chest Pain     HPI Pt c/o central chest pain radiating to left neck and back since 3am. Pt sts pain worse with movement. Pt also c/o shob.  Patient states that she spent most of the day on Monday in an MRI scanner.  She states the pain is localized to her left costochondral area.  It's worse when she moves and when she palpates that area.  Past Medical History  Diagnosis Date  . Asthma   . Hypertension   . Diabetes mellitus   . IBS (irritable bowel syndrome)   . GERD (gastroesophageal reflux disease)   . Eczema   . Thyroid disease   . Mixed connective tissue disease     Past Surgical History  Procedure Date  . Breast surgery   . Cholecystectomy   . Abdominal hysterectomy     History reviewed. No pertinent family history.  History  Substance Use Topics  . Smoking status: Never Smoker   . Smokeless tobacco: Not on file  . Alcohol Use: No    OB History    Grav Para Term Preterm Abortions TAB SAB Ect Mult Living                  Review of Systems  All other systems reviewed and are negative.    Allergies  Fish allergy; Gabapentin; Peanut-containing drug products; Atrovent; Crestor; Ipratropium-albuterol; and Penicillins  Home Medications   Current Outpatient Rx  Name Route Sig Dispense Refill  . ASPIRIN 81 MG PO TBEC Oral Take 81 mg by mouth daily.      Marland Kitchen BROCCOLI EXTRACT PO Oral Take 2 tablets by mouth at bedtime.      Marland Kitchen CLOTRIMAZOLE-BETAMETHASONE 1-0.05 % EX CREA Topical Apply topically 2 (two) times daily.      Marland Kitchen ESTROGENS, CONJUGATED 0.625 MG/GM VA CREA Vaginal Place 1 g vaginally 3 (three) times a week.      . DEXLANSOPRAZOLE 60 MG PO CPDR Oral Take 60 mg by mouth daily.      . ENALAPRIL MALEATE 20 MG PO TABS Oral Take 20 mg by mouth daily.      . FENTANYL 100 MCG/HR TD PT72  Transdermal Place 1 patch onto the skin every 3 (three) days.      Marland Kitchen FLUTICASONE PROPIONATE 50 MCG/ACT NA SUSP Nasal Place 2 sprays into the nose 2 (two) times daily.      Marland Kitchen GABAPENTIN 100 MG PO CAPS Oral Take 200 mg by mouth 2 (two) times daily.      . GUAIFENESIN ER 600 MG PO TB12 Oral Take 1,200 mg by mouth 2 (two) times daily.      . INSULIN REGULAR HUMAN (CONC) 500 UNIT/ML  SOLN Subcutaneous Inject 16 Units into the skin 3 (three) times daily with meals.      Marland Kitchen LEVALBUTEROL TARTRATE 45 MCG/ACT IN AERO Inhalation Inhale 1-2 puffs into the lungs 3 (three) times daily.      Marland Kitchen LEVALBUTEROL HCL 1.25 MG/0.5ML IN NEBU Nebulization Take 1 ampule by nebulization 3 (three) times daily as needed. Shortness of breath and wheezing     . LEVOTHYROXINE SODIUM 200 MCG PO TABS Oral Take 200 mcg by mouth daily.      Marland Kitchen LEVOTHYROXINE SODIUM 25 MCG PO TABS Oral Take 50  mcg by mouth daily.      Marland Kitchen METFORMIN HCL 1000 MG PO TABS Oral Take 1,000 mg by mouth 2 (two) times daily with a meal.      . METOCLOPRAMIDE HCL 10 MG PO TABS Oral Take 10 mg by mouth 2 (two) times daily.      Marland Kitchen ONE-DAILY MULTI VITAMINS PO TABS Oral Take 1 tablet by mouth daily.      Marland Kitchen VITAMINS A-D-E/SELENIUM PO TABS Oral Take 2 tablets by mouth daily.      Marland Kitchen NIACIN ER (ANTIHYPERLIPIDEMIC) 500 MG PO TBCR Oral Take 1,000 mg by mouth at bedtime.      . OXYCODONE HCL ER 10 MG PO TB12 Oral Take 10 mg by mouth every 4 (four) hours.      Marland Kitchen OXYMETAZOLINE HCL 0.05 % NA SOLN Nasal Place 2 sprays into the nose 2 (two) times daily.      Marland Kitchen PHENYLEPHRINE HCL 0.25 % NA SOLN Nasal Place 1 spray into the nose every 4 (four) hours as needed. Nasal congestion      . PROMETHAZINE HCL 25 MG PO TABS Oral Take 12.5-25 mg by mouth every 8 (eight) hours as needed. Nausea      . ROSUVASTATIN CALCIUM 40 MG PO TABS Oral Take 40 mg by mouth daily.      . SUCRALFATE 1 G PO TABS Oral Take 1 g by mouth 2 (two) times daily.      Marland Kitchen VITAMIN B-12 1000 MCG PO TABS Oral Take 1,000  mcg by mouth daily.      Marland Kitchen VITAMIN D (ERGOCALCIFEROL) 50000 UNITS PO CAPS Oral Take 50,000 Units by mouth every 7 (seven) days.        BP 169/76  Pulse 91  Temp 98.8 F (37.1 C) (Oral)  Resp 18  SpO2 98%  Physical Exam  Nursing note and vitals reviewed. Constitutional: She is oriented to person, place, and time. She appears well-developed and well-nourished. No distress.  HENT:  Head: Normocephalic and atraumatic.  Eyes: Pupils are equal, round, and reactive to light.  Neck: Normal range of motion.  Cardiovascular: Normal rate and intact distal pulses.        Normal sinus rhythm Rate = 91 QRS = 84 Left axis deviation Impression abnormal ECG No significant change from EKG of September 2012  Pulmonary/Chest: No respiratory distress.         Area of reproducible chest pain to palpation.  Abdominal: Normal appearance. She exhibits no distension.  Musculoskeletal: Normal range of motion.  Neurological: She is alert and oriented to person, place, and time. No cranial nerve deficit.  Skin: Skin is warm and dry. No rash noted.  Psychiatric: She has a normal mood and affect. Her behavior is normal.    ED Course  Procedures (including critical care time)  Labs Reviewed  BASIC METABOLIC PANEL - Abnormal; Notable for the following:    Glucose, Bld 195 (*)     All other components within normal limits  DIFFERENTIAL - Abnormal; Notable for the following:    Neutrophils Relative 42 (*)     All other components within normal limits  CBC  TROPONIN I   Dg Chest 2 View  02/28/2012  *RADIOLOGY REPORT*  Clinical Data: Left chest pain, shortness of breath, asthma, diabetes, hypertension, obesity, GERD  CHEST - 2 VIEW  Comparison: 06/06/2011  Findings: Borderline enlargement of cardiac silhouette. Mediastinal contours and pulmonary vascularity normal. Azygos fissure incidentally noted. Chronic subsegmental atelectasis versus scarring at lingula.  Lungs otherwise clear. No pleural effusion  or pneumothorax. No acute osseous findings.  IMPRESSION: Borderline enlargement of cardiac silhouette. Minimal chronic subsegmental atelectasis versus scarring lingula. No acute abnormalities.  Original Report Authenticated By: Lollie Marrow, M.D.     1. Costochondritis, acute       MDM          Nelia Shi, MD 02/28/12 1121

## 2012-02-28 NOTE — ED Notes (Signed)
Patient transported to X-ray via stretcher 

## 2012-02-28 NOTE — ED Notes (Signed)
Returned from [facility] 

## 2012-02-28 NOTE — ED Notes (Signed)
Pt c/o central chest pain radiating to left neck and back since 3am. Pt sts pain worse with movement. Pt also c/o shob.

## 2012-02-28 NOTE — Discharge Instructions (Signed)
Costochondritis Costochondritis is a condition in which the tissue (cartilage) that connects your ribs with your breastbone (sternum) becomes irritated and causes chest pain.  HOME CARE  Avoid activities that wear you out.   Do not strain your ribs. Avoid activities that use your:   Chest.   Belly.   Side muscles.   Put ice on the area.   Put ice in a plastic bag.   Place a towel betwen your skin and the bag.   Leave the ice on for 15 to 20 minutes, 3 to 4 times a day.   Only take medicine as told by your doctor.  GET HELP RIGHT AWAY IF:   Your pain gets worse.   You are very uncomfortable.   You have a fever.   You have trouble breathing.   You cough up blood.   You start sweating or throwing up (vomiting).   You develop new, unexplained symptoms.  MAKE SURE YOU:   Understand these instructions.   Will watch your condition.   Will get help right away if you are not doing well or get worse.  Document Released: 02/14/2008 Document Revised: 08/17/2011 Document Reviewed: 02/14/2008 [facility] Patient Information 2012 [place], [place].

## 2012-04-04 ENCOUNTER — Ambulatory Visit: Payer: PRIVATE HEALTH INSURANCE | Admitting: Internal Medicine

## 2012-09-21 ENCOUNTER — Encounter (HOSPITAL_BASED_OUTPATIENT_CLINIC_OR_DEPARTMENT_OTHER): Payer: Self-pay | Admitting: *Deleted

## 2012-09-21 ENCOUNTER — Emergency Department (HOSPITAL_BASED_OUTPATIENT_CLINIC_OR_DEPARTMENT_OTHER)
Admission: EM | Admit: 2012-09-21 | Discharge: 2012-09-22 | Disposition: A | Discharge status: Home / Self Care | Payer: BC Managed Care – PPO | Attending: Emergency Medicine | Admitting: Emergency Medicine

## 2012-09-21 DIAGNOSIS — Z7982 Long term (current) use of aspirin: Secondary | ICD-10-CM | POA: Insufficient documentation

## 2012-09-21 DIAGNOSIS — I10 Essential (primary) hypertension: Secondary | ICD-10-CM | POA: Insufficient documentation

## 2012-09-21 DIAGNOSIS — Z794 Long term (current) use of insulin: Secondary | ICD-10-CM | POA: Insufficient documentation

## 2012-09-21 DIAGNOSIS — R05 Cough: Secondary | ICD-10-CM | POA: Insufficient documentation

## 2012-09-21 DIAGNOSIS — Z8719 Personal history of other diseases of the digestive system: Secondary | ICD-10-CM | POA: Insufficient documentation

## 2012-09-21 DIAGNOSIS — E119 Type 2 diabetes mellitus without complications: Secondary | ICD-10-CM | POA: Insufficient documentation

## 2012-09-21 DIAGNOSIS — J45909 Unspecified asthma, uncomplicated: Secondary | ICD-10-CM | POA: Insufficient documentation

## 2012-09-21 DIAGNOSIS — E079 Disorder of thyroid, unspecified: Secondary | ICD-10-CM | POA: Insufficient documentation

## 2012-09-21 DIAGNOSIS — R059 Cough, unspecified: Secondary | ICD-10-CM | POA: Insufficient documentation

## 2012-09-21 DIAGNOSIS — Z79899 Other long term (current) drug therapy: Secondary | ICD-10-CM | POA: Insufficient documentation

## 2012-09-21 DIAGNOSIS — Z8739 Personal history of other diseases of the musculoskeletal system and connective tissue: Secondary | ICD-10-CM | POA: Insufficient documentation

## 2012-09-21 DIAGNOSIS — J111 Influenza due to unidentified influenza virus with other respiratory manifestations: Secondary | ICD-10-CM | POA: Insufficient documentation

## 2012-09-21 DIAGNOSIS — Z872 Personal history of diseases of the skin and subcutaneous tissue: Secondary | ICD-10-CM | POA: Insufficient documentation

## 2012-09-21 MED ORDER — ALBUTEROL SULFATE (5 MG/ML) 0.5% IN NEBU
5.0000 mg | INHALATION_SOLUTION | Freq: Once | RESPIRATORY_TRACT | Status: AC
  Administered 2012-09-21: 5 mg via RESPIRATORY_TRACT
  Filled 2012-09-21: qty 1

## 2012-09-21 MED ORDER — OSELTAMIVIR PHOSPHATE 75 MG PO CAPS: 75.0000 mg | ORAL_CAPSULE | Freq: Two times a day (BID) | ORAL | Status: DC

## 2012-09-21 NOTE — ED Notes (Signed)
John, RTT at bedside.

## 2012-09-21 NOTE — ED Provider Notes (Signed)
History  This chart was scribed for Hanley Seamen, MD by Shari Heritage, ED Scribe. The patient was seen in room MH03/MH03. Patient's care was started at 2322.   CSN: [id number]  Arrival date & time 09/21/12  2227   First MD Initiated Contact with Patient 09/21/12 2322      Chief Complaint  Patient presents with  . Flu-Like Symptoms     The history is provided by the patient. No language interpreter was used.    HPI Comments: [name] is a 52 y.o. female with history of asthma, diabetes, hypertension and GERD who presents to the Emergency Department complaining of fever, cough and congestion. Patient says that her fever and cough began yesterday, while congestion started today. Patient says that Tmax today was 102. There is associated right ear pain, sinus pressure, chest discomfort, sore throat, and nausea. Patient denies abdominal pain, vomiting or diarrhea. Patient has an albuterol inhaler and nebulizer at home which have given her partial relief. Patient says that she finished a breathing treatment 3 hours ago. Patient had a flu shot this season. Patient is not on steroids at this time. Patient does not smoke.  Past Medical History  Diagnosis Date  . Asthma   . Hypertension   . Diabetes mellitus   . IBS (irritable bowel syndrome)   . GERD (gastroesophageal reflux disease)   . Eczema   . Thyroid disease   . Mixed connective tissue disease     Past Surgical History  Procedure Date  . Breast surgery   . Cholecystectomy   . Abdominal hysterectomy     History reviewed. No pertinent family history.  History  Substance Use Topics  . Smoking status: Never Smoker   . Smokeless tobacco: Not on file  . Alcohol Use: No    OB History    Grav Para Term Preterm Abortions TAB SAB Ect Mult Living                  Review of Systems A complete 10 system review of systems was obtained and all systems are negative except as noted in the HPI and PMH.   Allergies  Fish  allergy; Gabapentin; Peanut-containing drug products; Atrovent; Crestor; Ipratropium-albuterol; and Penicillins  Home Medications   Current Outpatient Rx  Name  Route  Sig  Dispense  Refill  . ASPIRIN 81 MG PO TBEC   Oral   Take 81 mg by mouth daily.           Marland Kitchen BROCCOLI EXTRACT PO   Oral   Take 2 tablets by mouth at bedtime.           Marland Kitchen CLOTRIMAZOLE-BETAMETHASONE 1-0.05 % EX CREA   Topical   Apply topically 2 (two) times daily.           Marland Kitchen ESTROGENS, CONJUGATED 0.625 MG/GM VA CREA   Vaginal   Place 1 g vaginally 3 (three) times a week.           . DEXLANSOPRAZOLE 60 MG PO CPDR   Oral   Take 60 mg by mouth daily.           . ENALAPRIL MALEATE 20 MG PO TABS   Oral   Take 20 mg by mouth daily.           . FENTANYL 100 MCG/HR TD PT72   Transdermal   Place 1 patch onto the skin every 3 (three) days.           Marland Kitchen  FLUTICASONE PROPIONATE 50 MCG/ACT NA SUSP   Nasal   Place 2 sprays into the nose 2 (two) times daily.           Marland Kitchen GABAPENTIN 100 MG PO CAPS   Oral   Take 200 mg by mouth 2 (two) times daily.           . GUAIFENESIN ER 600 MG PO TB12   Oral   Take 1,200 mg by mouth 2 (two) times daily.           . INSULIN REGULAR HUMAN (CONC) 500 UNIT/ML [place] SOLN   Subcutaneous   Inject 16 Units into the skin 3 (three) times daily with meals.           Marland Kitchen LEVALBUTEROL TARTRATE 45 MCG/ACT IN AERO   Inhalation   Inhale 1-2 puffs into the lungs 3 (three) times daily.           Marland Kitchen LEVALBUTEROL HCL 1.25 MG/0.5ML IN NEBU   Nebulization   Take 1 ampule by nebulization 3 (three) times daily as needed. Shortness of breath and wheezing          . LEVOTHYROXINE SODIUM 200 MCG PO TABS   Oral   Take 200 mcg by mouth daily.           Marland Kitchen LEVOTHYROXINE SODIUM 25 MCG PO TABS   Oral   Take 50 mcg by mouth daily.           Marland Kitchen METFORMIN HCL 1000 MG PO TABS   Oral   Take 1,000 mg by mouth 2 (two) times daily with a meal.           . METOCLOPRAMIDE HCL 10  MG PO TABS   Oral   Take 10 mg by mouth 2 (two) times daily.           Marland Kitchen ONE-DAILY MULTI VITAMINS PO TABS   Oral   Take 1 tablet by mouth daily.           Marland Kitchen VITAMINS A-D-E/SELENIUM PO TABS   Oral   Take 2 tablets by mouth daily.           Marland Kitchen NIACIN ER (ANTIHYPERLIPIDEMIC) 500 MG PO TBCR   Oral   Take 1,000 mg by mouth at bedtime.           . OXYCODONE HCL ER 10 MG PO TB12   Oral   Take 10 mg by mouth every 4 (four) hours.           Marland Kitchen OXYMETAZOLINE HCL 0.05 % NA SOLN   Nasal   Place 2 sprays into the nose 2 (two) times daily.           Marland Kitchen PHENYLEPHRINE HCL 0.25 % NA SOLN   Nasal   Place 1 spray into the nose every 4 (four) hours as needed. Nasal congestion           . PROMETHAZINE HCL 25 MG PO TABS   Oral   Take 12.5-25 mg by mouth every 8 (eight) hours as needed. Nausea           . ROSUVASTATIN CALCIUM 40 MG PO TABS   Oral   Take 40 mg by mouth daily.           . SUCRALFATE 1 G PO TABS   Oral   Take 1 g by mouth 2 (two) times daily.           Marland Kitchen VITAMIN B-12 1000 MCG PO  TABS   Oral   Take 1,000 mcg by mouth daily.           Marland Kitchen VITAMIN D (ERGOCALCIFEROL) 50000 UNITS PO CAPS   Oral   Take 50,000 Units by mouth every 7 (seven) days.             Triage Vitals: BP 152/67  Pulse 114  Temp 99.2 F (37.3 C) (Oral)  Resp 18  Ht 5\' 2"  (1.575 m)  Wt 295 lb (133.811 kg)  BMI 53.96 kg/m2  SpO2 92%  Physical Exam  Constitutional: She is oriented to person, place, and time. She appears well-developed and well-nourished. No distress.  HENT:  Head: Normocephalic and atraumatic.  Right Ear: Tympanic membrane and ear canal normal.  Left Ear: Tympanic membrane normal.  Mouth/Throat: Oropharynx is clear and moist and mucous membranes are normal.  Eyes: Conjunctivae normal and EOM are normal. Pupils are equal, round, and reactive to light.  Neck: Neck supple.  Cardiovascular: Normal rate and regular rhythm.   No murmur heard. Pulmonary/Chest:  Effort normal and breath sounds normal. No respiratory distress. She has no wheezes. She has no rales.       Distant sounds with dry cough.  Abdominal: Soft. Bowel sounds are normal. She exhibits no distension. There is no tenderness. There is no rebound and no guarding.  Musculoskeletal: Normal range of motion.  Neurological: She is alert and oriented to person, place, and time.  Skin: Skin is warm and dry.  Psychiatric: She has a normal mood and affect. Her behavior is normal.    ED Course  Procedures (including critical care time) DIAGNOSTIC STUDIES: Oxygen Saturation is 92% on room air, adequate by my interpretation.    COORDINATION OF CARE: 11:27 PM- Patient informed of current plan for treatment and evaluation and agrees with plan at this time.        MDM  I personally performed the services described in this documentation, which was scribed in my presence.  The recorded information has been reviewed and is accurate.  We'll prescribe Tamiflu as patient has risk factors justify its administration.  Hanley Seamen, MD 09/21/12 [phone]

## 2012-09-21 NOTE — ED Notes (Signed)
Cough, congestion, fever onset Friday. Husband being seen as well for same. RRT to triage to assess.

## 2012-11-11 ENCOUNTER — Observation Stay (HOSPITAL_BASED_OUTPATIENT_CLINIC_OR_DEPARTMENT_OTHER)
Admission: EM | Admit: 2012-11-11 | Discharge: 2012-11-14 | Disposition: A | Discharge status: Home / Self Care | Payer: BC Managed Care – PPO | Source: Ambulatory Visit | Attending: Internal Medicine | Admitting: Internal Medicine

## 2012-11-11 ENCOUNTER — Emergency Department (HOSPITAL_BASED_OUTPATIENT_CLINIC_OR_DEPARTMENT_OTHER): Payer: BC Managed Care – PPO

## 2012-11-11 ENCOUNTER — Encounter (HOSPITAL_BASED_OUTPATIENT_CLINIC_OR_DEPARTMENT_OTHER): Payer: Self-pay | Admitting: *Deleted

## 2012-11-11 DIAGNOSIS — R0602 Shortness of breath: Secondary | ICD-10-CM | POA: Insufficient documentation

## 2012-11-11 DIAGNOSIS — M3589 Other specified systemic involvement of connective tissue: Secondary | ICD-10-CM | POA: Insufficient documentation

## 2012-11-11 DIAGNOSIS — I10 Essential (primary) hypertension: Secondary | ICD-10-CM | POA: Insufficient documentation

## 2012-11-11 DIAGNOSIS — E119 Type 2 diabetes mellitus without complications: Secondary | ICD-10-CM | POA: Insufficient documentation

## 2012-11-11 DIAGNOSIS — R0789 Other chest pain: Principal | ICD-10-CM | POA: Insufficient documentation

## 2012-11-11 DIAGNOSIS — M351 Other overlap syndromes: Secondary | ICD-10-CM | POA: Diagnosis present

## 2012-11-11 DIAGNOSIS — M358 Other specified systemic involvement of connective tissue: Secondary | ICD-10-CM | POA: Insufficient documentation

## 2012-11-11 DIAGNOSIS — R079 Chest pain, unspecified: Secondary | ICD-10-CM

## 2012-11-11 DIAGNOSIS — J45909 Unspecified asthma, uncomplicated: Secondary | ICD-10-CM | POA: Diagnosis present

## 2012-11-11 DIAGNOSIS — E039 Hypothyroidism, unspecified: Secondary | ICD-10-CM | POA: Diagnosis present

## 2012-11-11 DIAGNOSIS — IMO0001 Reserved for inherently not codable concepts without codable children: Secondary | ICD-10-CM | POA: Diagnosis present

## 2012-11-11 DIAGNOSIS — E785 Hyperlipidemia, unspecified: Secondary | ICD-10-CM | POA: Diagnosis present

## 2012-11-11 DIAGNOSIS — M542 Cervicalgia: Secondary | ICD-10-CM | POA: Insufficient documentation

## 2012-11-11 LAB — BASIC METABOLIC PANEL
BUN: 11 mg/dL (ref 6–23)
CO2: 27 mEq/L (ref 19–32)
Calcium: 9.9 mg/dL (ref 8.4–10.5)
Chloride: 98 mEq/L (ref 96–112)
Creatinine, Ser: 0.7 mg/dL (ref 0.50–1.10)
GFR calc Af Amer: >90 mL/min (ref >90)
GFR calc non Af Amer: >90 mL/min (ref >90)
Glucose, Bld: 174 mg/dL — ABNORMAL HIGH (ref 70–99)
Potassium: 3.9 mEq/L (ref 3.5–5.1)
Sodium: 139 mEq/L (ref 135–145)

## 2012-11-11 LAB — GLUCOSE, CAPILLARY: Glucose-Capillary: 257 mg/dL — ABNORMAL HIGH (ref 70–99)

## 2012-11-11 LAB — CBC WITH DIFFERENTIAL/PLATELET
Basophils Absolute: 0.1 10*3/uL (ref 0.0–0.1)
Basophils Relative: 1 % (ref 0–1)
Eosinophils Absolute: 0.3 10*3/uL (ref 0.0–0.7)
Eosinophils Relative: 4 % (ref 0–5)
HCT: 43.8 % (ref 36.0–46.0)
Hemoglobin: 14.2 g/dL (ref 12.0–15.0)
Lymphocytes Relative: 41 % (ref 12–46)
Lymphs Abs: 3.5 10*3/uL (ref 0.7–4.0)
MCH: 29.3 pg (ref 26.0–34.0)
MCHC: 32.4 g/dL (ref 30.0–36.0)
MCV: 90.5 fL (ref 78.0–100.0)
Monocytes Absolute: 0.6 10*3/uL (ref 0.1–1.0)
Monocytes Relative: 7 % (ref 3–12)
Neutro Abs: 4 10*3/uL (ref 1.7–7.7)
Neutrophils Relative %: 47 % (ref 43–77)
Platelets: 169 10*3/uL (ref 150–400)
RBC: 4.84 MIL/uL (ref 3.87–5.11)
RDW: 14.1 % (ref 11.5–15.5)
WBC: 8.4 10*3/uL (ref 4.0–10.5)

## 2012-11-11 LAB — CBC
HCT: 41.8 % (ref 36.0–46.0)
Hemoglobin: 14 g/dL (ref 12.0–15.0)
MCH: 29.9 pg (ref 26.0–34.0)
MCHC: 33.5 g/dL (ref 30.0–36.0)
MCV: 89.3 fL (ref 78.0–100.0)
Platelets: 174 10*3/uL (ref 150–400)
RBC: 4.68 MIL/uL (ref 3.87–5.11)
RDW: 14 % (ref 11.5–15.5)
WBC: 8.8 10*3/uL (ref 4.0–10.5)

## 2012-11-11 LAB — CREATININE, SERUM
Creatinine, Ser: 0.76 mg/dL (ref 0.50–1.10)
GFR calc Af Amer: >90 mL/min (ref >90)
GFR calc non Af Amer: >90 mL/min (ref >90)

## 2012-11-11 LAB — TROPONIN I
Troponin I: <0.3 ng/mL (ref <0.30)
Troponin I: <0.3 ng/mL (ref <0.30)

## 2012-11-11 MED ORDER — ATORVASTATIN CALCIUM 80 MG PO TABS
80.0000 mg | ORAL_TABLET | Freq: Every day | ORAL | Status: DC
  Filled 2012-11-11 (×3): qty 1

## 2012-11-11 MED ORDER — CLOTRIMAZOLE-BETAMETHASONE 1-0.05 % EX CREA
1.0000 "application " | TOPICAL_CREAM | Freq: Two times a day (BID) | CUTANEOUS | Status: DC
  Administered 2012-11-12 – 2012-11-14 (×4): 1 via TOPICAL
  Filled 2012-11-11 (×2): qty 15

## 2012-11-11 MED ORDER — ASPIRIN EC 325 MG PO TBEC
325.0000 mg | DELAYED_RELEASE_TABLET | Freq: Every day | ORAL | Status: DC
  Administered 2012-11-12 – 2012-11-14 (×3): 325 mg via ORAL
  Filled 2012-11-11 (×4): qty 1

## 2012-11-11 MED ORDER — LEVOTHYROXINE SODIUM 50 MCG PO TABS
50.0000 ug | ORAL_TABLET | Freq: Every day | ORAL | Status: DC
  Administered 2012-11-12 – 2012-11-14 (×3): 50 ug via ORAL
  Filled 2012-11-11 (×4): qty 1

## 2012-11-11 MED ORDER — VITAMIN B-12 1000 MCG PO TABS
1000.0000 ug | ORAL_TABLET | Freq: Every day | ORAL | Status: DC
  Administered 2012-11-12 – 2012-11-14 (×3): 1000 ug via ORAL
  Filled 2012-11-11 (×3): qty 1

## 2012-11-11 MED ORDER — GABAPENTIN 100 MG PO CAPS
200.0000 mg | ORAL_CAPSULE | Freq: Two times a day (BID) | ORAL | Status: DC
  Administered 2012-11-11 – 2012-11-14 (×6): 200 mg via ORAL
  Filled 2012-11-11 (×7): qty 2

## 2012-11-11 MED ORDER — METOCLOPRAMIDE HCL 10 MG PO TABS
10.0000 mg | ORAL_TABLET | Freq: Two times a day (BID) | ORAL | Status: DC
  Administered 2012-11-11 – 2012-11-14 (×6): 10 mg via ORAL
  Filled 2012-11-11 (×8): qty 1

## 2012-11-11 MED ORDER — ACETAMINOPHEN 325 MG PO TABS: 650.0000 mg | ORAL_TABLET | Freq: Four times a day (QID) | ORAL | Status: DC | PRN

## 2012-11-11 MED ORDER — MORPHINE SULFATE 4 MG/ML IJ SOLN
4.0000 mg | Freq: Once | INTRAMUSCULAR | Status: AC
  Administered 2012-11-11: 4 mg via INTRAVENOUS
  Filled 2012-11-11: qty 1

## 2012-11-11 MED ORDER — FENTANYL 25 MCG/HR TD PT72
100.0000 ug | MEDICATED_PATCH | TRANSDERMAL | Status: DC
  Administered 2012-11-11: 100 ug via TRANSDERMAL
  Filled 2012-11-11: qty 4

## 2012-11-11 MED ORDER — NITROGLYCERIN 0.4 MG SL SUBL: 0.4000 mg | SUBLINGUAL_TABLET | SUBLINGUAL | Status: DC | PRN

## 2012-11-11 MED ORDER — INSULIN ASPART 100 UNIT/ML ~~LOC~~ SOLN
0.0000 [IU] | Freq: Three times a day (TID) | SUBCUTANEOUS | Status: DC
  Administered 2012-11-12 (×3): 3 [IU] via SUBCUTANEOUS
  Administered 2012-11-13: 5 [IU] via SUBCUTANEOUS
  Administered 2012-11-13: 3 [IU] via SUBCUTANEOUS
  Administered 2012-11-13: 10 [IU] via SUBCUTANEOUS
  Administered 2012-11-14 (×2): 3 [IU] via SUBCUTANEOUS

## 2012-11-11 MED ORDER — LEVOTHYROXINE SODIUM 200 MCG PO TABS
200.0000 ug | ORAL_TABLET | Freq: Every day | ORAL | Status: DC
  Administered 2012-11-12 – 2012-11-14 (×3): 200 ug via ORAL
  Filled 2012-11-11 (×4): qty 1

## 2012-11-11 MED ORDER — ONDANSETRON HCL 4 MG/2ML IJ SOLN: 4.0000 mg | Freq: Four times a day (QID) | INTRAMUSCULAR | Status: DC | PRN

## 2012-11-11 MED ORDER — NIACIN ER (ANTIHYPERLIPIDEMIC) 500 MG PO TBCR
1000.0000 mg | EXTENDED_RELEASE_TABLET | Freq: Every day | ORAL | Status: DC
  Administered 2012-11-12 – 2012-11-13 (×3): 1000 mg via ORAL
  Filled 2012-11-11 (×5): qty 2

## 2012-11-11 MED ORDER — LEVALBUTEROL HCL 1.25 MG/0.5ML IN NEBU
1.2500 mg | INHALATION_SOLUTION | Freq: Four times a day (QID) | RESPIRATORY_TRACT | Status: DC | PRN
  Administered 2012-11-12 – 2012-11-13 (×3): 1.25 mg via RESPIRATORY_TRACT
  Filled 2012-11-11 (×3): qty 0.5

## 2012-11-11 MED ORDER — OXYCODONE-ACETAMINOPHEN 5-325 MG PO TABS
2.0000 | ORAL_TABLET | Freq: Once | ORAL | Status: AC
  Administered 2012-11-11: 2 via ORAL
  Filled 2012-11-11 (×2): qty 2

## 2012-11-11 MED ORDER — FLUTICASONE PROPIONATE 50 MCG/ACT NA SUSP
2.0000 | Freq: Two times a day (BID) | NASAL | Status: DC
  Administered 2012-11-11 – 2012-11-14 (×6): 2 via NASAL
  Filled 2012-11-11: qty 16

## 2012-11-11 MED ORDER — ENALAPRIL MALEATE 20 MG PO TABS
20.0000 mg | ORAL_TABLET | Freq: Every day | ORAL | Status: DC
  Administered 2012-11-12 – 2012-11-14 (×3): 20 mg via ORAL
  Filled 2012-11-11 (×3): qty 1

## 2012-11-11 MED ORDER — ONDANSETRON HCL 4 MG PO TABS: 4.0000 mg | ORAL_TABLET | Freq: Four times a day (QID) | ORAL | Status: DC | PRN

## 2012-11-11 MED ORDER — GUAIFENESIN ER 600 MG PO TB12
1200.0000 mg | ORAL_TABLET | Freq: Two times a day (BID) | ORAL | Status: DC
  Administered 2012-11-12 – 2012-11-14 (×6): 1200 mg via ORAL
  Filled 2012-11-11 (×9): qty 2

## 2012-11-11 MED ORDER — OXYCODONE HCL 10 MG PO TB12: 10.0000 mg | ORAL_TABLET | ORAL | Status: DC

## 2012-11-11 MED ORDER — OXYCODONE HCL ER 10 MG PO T12A
10.0000 mg | EXTENDED_RELEASE_TABLET | Freq: Two times a day (BID) | ORAL | Status: DC
  Administered 2012-11-11 – 2012-11-14 (×6): 10 mg via ORAL
  Filled 2012-11-11 (×6): qty 1

## 2012-11-11 MED ORDER — SODIUM CHLORIDE 0.9 % IJ SOLN
3.0000 mL | Freq: Two times a day (BID) | INTRAMUSCULAR | Status: DC
  Administered 2012-11-13: 3 mL via INTRAVENOUS

## 2012-11-11 MED ORDER — PROMETHAZINE HCL 25 MG PO TABS: 12.5000 mg | ORAL_TABLET | Freq: Three times a day (TID) | ORAL | Status: DC | PRN

## 2012-11-11 MED ORDER — LEVALBUTEROL TARTRATE 45 MCG/ACT IN AERO
1.0000 | INHALATION_SPRAY | Freq: Three times a day (TID) | RESPIRATORY_TRACT | Status: DC
  Administered 2012-11-11 – 2012-11-13 (×4): 2 via RESPIRATORY_TRACT
  Filled 2012-11-11: qty 15

## 2012-11-11 MED ORDER — VITAMIN D (ERGOCALCIFEROL) 1.25 MG (50000 UNIT) PO CAPS
50000.0000 [IU] | ORAL_CAPSULE | ORAL | Status: DC
  Administered 2012-11-12: 50000 [IU] via ORAL
  Filled 2012-11-11: qty 1

## 2012-11-11 MED ORDER — ENOXAPARIN SODIUM 30 MG/0.3ML ~~LOC~~ SOLN
30.0000 mg | SUBCUTANEOUS | Status: DC
  Administered 2012-11-11 – 2012-11-13 (×3): 30 mg via SUBCUTANEOUS
  Filled 2012-11-11 (×4): qty 0.3

## 2012-11-11 MED ORDER — SUCRALFATE 1 G PO TABS
1.0000 g | ORAL_TABLET | Freq: Two times a day (BID) | ORAL | Status: DC
  Administered 2012-11-12 – 2012-11-14 (×6): 1 g via ORAL
  Filled 2012-11-11 (×9): qty 1

## 2012-11-11 MED ORDER — PANTOPRAZOLE SODIUM 40 MG PO TBEC
40.0000 mg | DELAYED_RELEASE_TABLET | Freq: Every day | ORAL | Status: DC
  Administered 2012-11-12 – 2012-11-14 (×3): 40 mg via ORAL
  Filled 2012-11-11 (×3): qty 1

## 2012-11-11 MED ORDER — ASPIRIN 81 MG PO CHEW
324.0000 mg | CHEWABLE_TABLET | Freq: Once | ORAL | Status: AC
  Administered 2012-11-11: 324 mg via ORAL
  Filled 2012-11-11: qty 4

## 2012-11-11 MED ORDER — SODIUM CHLORIDE 0.9 % IJ SOLN
3.0000 mL | Freq: Two times a day (BID) | INTRAMUSCULAR | Status: DC
  Administered 2012-11-11 – 2012-11-14 (×6): 3 mL via INTRAVENOUS

## 2012-11-11 MED ORDER — ACETAMINOPHEN 650 MG RE SUPP: 650.0000 mg | Freq: Four times a day (QID) | RECTAL | Status: DC | PRN

## 2012-11-11 NOTE — ED Notes (Signed)
Attempted to call report to floor, RN unavailable, name and number left for return call

## 2012-11-11 NOTE — ED Notes (Signed)
Sharp chest pain x 2 days. Lasting 30-40 minutes. Pain goes into her neck and causes her sob.

## 2012-11-11 NOTE — H&P (Signed)
[facility] History and Physical  [name] WUJ:[id number] DOB: 1960-09-21 DOA: 11/11/2012  Referring physician: ER physician at [facility]. PCP: No primary [name] on file. [facility] [place]. Specialists: None.  Chief Complaint: Chest pain.  HPI: [name] is a 52 y.o. female with history of diabetes mellitus type 2, hypertension, asthma, mixed connective tissue disorder, hypothyroidism presented to the ER in [facility] with complaints of chest pain which has been ongoing for last 3 days. The pain is mostly on the left anterior chest wall sometimes radiating to her neck with occasional shortness of breath and diaphoresis. Each time the chest pain lasted for half an hour. Felt like stabbing in nature. Denies any palpitation dizziness fever chills per her to cough nausea vomiting abdominal pain. EKG chest x-ray cardiac enzymes were unremarkable but given patient's risk factors patient has been admitted for further management. Patient has had a stress test last year which has to the patient was normal.  Review of Systems: As presented in the history of presenting illness nothing else significant.  Past Medical History  Diagnosis Date  . Asthma   . Hypertension   . Diabetes mellitus   . IBS (irritable bowel syndrome)   . GERD (gastroesophageal reflux disease)   . Eczema   . Thyroid disease   . Mixed connective tissue disease    Past Surgical History  Procedure Laterality Date  . Breast surgery    . Cholecystectomy    . Abdominal hysterectomy     Social History:  reports that she has never smoked. She does not have any smokeless tobacco history on file. She reports that she does not drink alcohol or use illicit drugs. Lives at home. where does patient live--home, ALF, SNF? and with whom if at home? Can do ADLs. Can patient participate in ADLs?  Allergies  Allergen Reactions  . Fish Allergy Anaphylaxis  . Gabapentin  Anaphylaxis and Anxiety    Panic and think weird thoughts  . Peanut-Containing Drug Products Shortness Of Breath  . Atrovent     Wheezing becomes worse  . Crestor (Rosuvastatin)   . Ipratropium-Albuterol     Wheezing becomes worse  . Penicillins     Unknown     Family History  Problem Relation Age of Onset  . CAD Mother   . Hypertension Mother   . CAD Father       Prior to Admission medications   Medication Sig Start Date End Date Taking? Authorizing Elberta Lachapelle  aspirin (ASPIRIN EC) 81 MG EC tablet Take 81 mg by mouth daily.      Historical Prateek Knipple, MD  BROCCOLI EXTRACT PO Take 2 tablets by mouth at bedtime.      Historical Elis Sauber, MD  clotrimazole-betamethasone (LOTRISONE) cream Apply topically 2 (two) times daily.      Historical Mildreth Reek, MD  conjugated estrogens (PREMARIN) vaginal cream Place 1 g vaginally 3 (three) times a week.      Historical Damek Ende, MD  dexlansoprazole (DEXILANT) 60 MG capsule Take 60 mg by mouth daily.      Historical Anand Tejada, MD  enalapril (VASOTEC) 20 MG tablet Take 20 mg by mouth daily.      Historical Beautiful Pensyl, MD  fentaNYL (DURAGESIC - DOSED MCG/HR) 100 MCG/HR Place 1 patch onto the skin every 3 (three) days.      Historical Epic Tribbett, MD  fluticasone (FLONASE) 50 MCG/ACT nasal spray Place 2 sprays into the nose 2 (two) times daily.  Historical Wandy Bossler, MD  gabapentin (NEURONTIN) 100 MG capsule Take 200 mg by mouth 2 (two) times daily.      Historical Jessenya Berdan, MD  guaiFENesin (MUCINEX) 600 MG 12 hr tablet Take 1,200 mg by mouth 2 (two) times daily.      Historical Ovetta Bazzano, MD  insulin regular human CONCENTRATED (HUMULIN R) 500 UNIT/ML SOLN injection Inject 16 Units into the skin 3 (three) times daily with meals.      Historical Herley Bernardini, MD  levalbuterol [facility] HFA) 45 MCG/ACT inhaler Inhale 1-2 puffs into the lungs 3 (three) times daily.      Historical Shantara Goosby, MD  levalbuterol (XOPENEX) 1.25 MG/0.5ML nebulizer solution Take 1 ampule by  nebulization 3 (three) times daily as needed. Shortness of breath and wheezing     Historical Naiyah Klostermann, MD  levothyroxine (SYNTHROID, LEVOTHROID) 200 MCG tablet Take 200 mcg by mouth daily.      Historical Natassia Guthridge, MD  levothyroxine (SYNTHROID, LEVOTHROID) 25 MCG tablet Take 50 mcg by mouth daily.      Historical Zeeva Courser, MD  metFORMIN (GLUCOPHAGE) 1000 MG tablet Take 1,000 mg by mouth 2 (two) times daily with a meal.      Historical Cambre Matson, MD  metoCLOPramide (REGLAN) 10 MG tablet Take 10 mg by mouth 2 (two) times daily.      Historical Aldona Bryner, MD  Multiple Vitamin (MULTIVITAMIN) tablet Take 1 tablet by mouth daily.      Historical Calan Doren, MD  Multiple Vitamins-Minerals (VITAMINS A-D-E/SELENIUM) TABS Take 2 tablets by mouth daily.      Historical Durene Dodge, MD  niacin (NIASPAN) 500 MG CR tablet Take 1,000 mg by mouth at bedtime.      Historical Alaine Loughney, MD  oseltamivir (TAMIFLU) 75 MG capsule Take 1 capsule (75 mg total) by mouth every 12 (twelve) hours. 09/21/12   John L Molpus, MD  oxyCODONE (OXYCONTIN) 10 MG 12 hr tablet Take 10 mg by mouth every 4 (four) hours.      Historical Madina Galati, MD  oxymetazoline (AFRIN) 0.05 % nasal spray Place 2 sprays into the nose 2 (two) times daily.      Historical Ramsay Bognar, MD  phenylephrine (NEO-SYNEPHRINE) 0.25 % nasal spray Place 1 spray into the nose every 4 (four) hours as needed. Nasal congestion      Historical Tarin Navarez, MD  promethazine (PHENERGAN) 25 MG tablet Take 12.5-25 mg by mouth every 8 (eight) hours as needed. Nausea      Historical Mckaylee Dimalanta, MD  rosuvastatin (CRESTOR) 40 MG tablet Take 40 mg by mouth daily.      Historical Gaige Sebo, MD  sucralfate (CARAFATE) 1 G tablet Take 1 g by mouth 2 (two) times daily.      Historical Revella Shelton, MD  vitamin B-12 (CYANOCOBALAMIN) 1000 MCG tablet Take 1,000 mcg by mouth daily.      Historical Donnah Levert, MD  Vitamin D, Ergocalciferol, (DRISDOL) 50000 UNITS CAPS Take 50,000 Units by mouth every 7 (seven)  days.      Historical Cortasia Screws, MD   Physical Exam: Filed Vitals:   11/11/12 1553 11/11/12 1742 11/11/12 1912 11/11/12 2052  BP: 143/72 136/72 133/72 138/85  Pulse: 86 79 82 87  Temp: 97.9 F (36.6 C) 98 F (36.7 C)    TempSrc: Oral Oral  Oral  Resp: 22  16   Height:    5\' 2"  (1.575 m)  Weight:    134.3 kg (296 lb 1.2 oz)  SpO2: 97% 99% 95% 96%     General:  Well-developed well-nourished.  Eyes: Anicteric no  pallor.  ENT: No discharge from the ears eyes nose or mouth.  Neck: No mass felt.  Cardiovascular: S1-S2 heard.  Respiratory: No rhonchi or crepitations.  Abdomen: Soft nontender bowel sounds present.  Skin:  No rash seen.  Musculoskeletal: No edema no effusion.  Psychiatric: Appears normal.  Neurologic: Moves all extremities.  Labs on Admission:  Basic Metabolic Panel:  Recent Labs Lab 11/11/12 1607  NA 139  K 3.9  CL 98  CO2 27  GLUCOSE 174*  BUN 11  CREATININE 0.70  CALCIUM 9.9   Liver Function Tests: No results found for this basename: AST, ALT, ALKPHOS, BILITOT, PROT, ALBUMIN,  in the last 168 hours No results found for this basename: LIPASE, AMYLASE,  in the last 168 hours No results found for this basename: AMMONIA,  in the last 168 hours CBC:  Recent Labs Lab 11/11/12 1607  WBC 8.4  NEUTROABS 4.0  HGB 14.2  HCT 43.8  MCV 90.5  PLT 169   Cardiac Enzymes:  Recent Labs Lab 11/11/12 1607  TROPONINI <0.30    BNP (last 3 results) No results found for this basename: PROBNP,  in the last 8760 hours CBG: No results found for this basename: GLUCAP,  in the last 168 hours  Radiological Exams on Admission: Dg Chest 2 View  11/11/2012  *RADIOLOGY REPORT*  Clinical Data: Lambert Mody left side chest pain for 2 days, pain into neck, associated shortness of breath, history asthma, hypertension, diabetes, GERD  CHEST - 2 VIEW  Comparison: 02/28/2012  Findings: Normal heart size, mediastinal contours, and pulmonary vascularity. Azygos fissure  noted. Lungs clear. No pleural effusion or pneumothorax. Bones unremarkable.  IMPRESSION: No acute abnormalities.   Original Report Authenticated By: Ulyses Southward, M.D.     EKG: Independently reviewed. Normal sinus rhythm.  Assessment/Plan Principal Problem:   Chest pain Active Problems:   Type II or unspecified type diabetes mellitus without mention of complication, not stated as uncontrolled   Essential hypertension, benign   Other and unspecified hyperlipidemia   Mixed connective tissue disease   Unspecified hypothyroidism   Asthma   1. Chest pain - given patient's multiple risk factors at this time we will rule out ACS. Check d-dimer. Check 2-D echo cycle cardiac markers. Aspirin. Nitroglycerin when necessary. 2. Diabetes mellitus type 2 - continue present medications. Check CBG with sliding scale coverage. 3. Hypertension - continue present medications. 4. Asthma - presently not wheezing. 5. Mixed connective tissue disorder - continue home medication. 6. Hypothyroidism - check TSH. Continue Synthroid.   Code Status: Full code. Family Communication: None at the bedside.  Disposition Plan: Admit for observation.   KAKRAKANDY,ARSHAD N. [facility] Pager [phone].  If 7PM-7AM, please contact night-coverage [email or web address] Password [facility] 11/11/2012, 9:57 PM

## 2012-11-11 NOTE — ED Provider Notes (Signed)
History  This chart was scribed for Charles B. Bernette Mayers, MD by Marlyne Beards and Shari Heritage, ED Scribes. The patient was seen in room MH03/MH03. Patient's care was started at 1548.   CSN: [id number]  Arrival date & time 11/11/12  1545   First MD Initiated Contact with Patient 11/11/12 1548      Chief Complaint  Patient presents with  . Chest Pain    The history is provided by the patient. No language interpreter was used.    HPI Comments: [name] is a 52 y.o. female who presents to the Emergency Department complaining of sharp, episodic left sided chest pain onset yesterday. Pt reports episodes lasts about 30 minutes.  First episode began while pt was lying down. She reports 3-4 episodes of pain since onset. Patient denies any chest pain right now. There is associated neck pain and increased SOB. She says she only has neck pain with chest pain episodes. She states that pain feels different from past muscle pain or GERD related pain. Pt denies fever, chills, cough, nausea, vomiting, diarrhea, weakness, and any other associated symptoms. She took aspirin yesterday, but none today. Patient claims she was diagnosed with 30% stenosis of carotid artery last year at [facility] by Arnette Felts, PA. She says that she had a nuclear stress test and vascular US, but no cardiac catheterization.  She has a h/o asthma, HTN, GERD, DM and mixed connective tissue disease.    Past Medical History  Diagnosis Date  . Asthma   . Hypertension   . Diabetes mellitus   . IBS (irritable bowel syndrome)   . GERD (gastroesophageal reflux disease)   . Eczema   . Thyroid disease   . Mixed connective tissue disease     Past Surgical History  Procedure Laterality Date  . Breast surgery    . Cholecystectomy    . Abdominal hysterectomy      No family history on file.  History  Substance Use Topics  . Smoking status: Never Smoker   . Smokeless tobacco: Not on file  . Alcohol Use: No    OB  History   Grav Para Term Preterm Abortions TAB SAB Ect Mult Living                  Review of Systems A complete 10 system review of systems was obtained and all systems are negative except as noted in the HPI and PMH.   Allergies  Fish allergy; Gabapentin; Peanut-containing drug products; Atrovent; Crestor; Ipratropium-albuterol; and Penicillins  Home Medications   Current Outpatient Rx  Name  Route  Sig  Dispense  Refill  . aspirin (ASPIRIN EC) 81 MG EC tablet   Oral   Take 81 mg by mouth daily.           Marland Kitchen BROCCOLI EXTRACT PO   Oral   Take 2 tablets by mouth at bedtime.           . clotrimazole-betamethasone (LOTRISONE) cream   Topical   Apply topically 2 (two) times daily.           Marland Kitchen conjugated estrogens (PREMARIN) vaginal cream   Vaginal   Place 1 g vaginally 3 (three) times a week.           Marland Kitchen dexlansoprazole (DEXILANT) 60 MG capsule   Oral   Take 60 mg by mouth daily.           . enalapril (VASOTEC) 20 MG tablet   Oral  Take 20 mg by mouth daily.           . fentaNYL (DURAGESIC - DOSED MCG/HR) 100 MCG/HR   Transdermal   Place 1 patch onto the skin every 3 (three) days.           . fluticasone (FLONASE) 50 MCG/ACT nasal spray   Nasal   Place 2 sprays into the nose 2 (two) times daily.           Marland Kitchen gabapentin (NEURONTIN) 100 MG capsule   Oral   Take 200 mg by mouth 2 (two) times daily.           Marland Kitchen guaiFENesin (MUCINEX) 600 MG 12 hr tablet   Oral   Take 1,200 mg by mouth 2 (two) times daily.           . insulin regular human CONCENTRATED (HUMULIN R) 500 UNIT/ML SOLN injection   Subcutaneous   Inject 16 Units into the skin 3 (three) times daily with meals.           Marland Kitchen levalbuterol (XOPENEX HFA) 45 MCG/ACT inhaler   Inhalation   Inhale 1-2 puffs into the lungs 3 (three) times daily.           Marland Kitchen levalbuterol (XOPENEX) 1.25 MG/0.5ML nebulizer solution   Nebulization   Take 1 ampule by nebulization 3 (three) times daily as  needed. Shortness of breath and wheezing          . levothyroxine (SYNTHROID, LEVOTHROID) 200 MCG tablet   Oral   Take 200 mcg by mouth daily.           Marland Kitchen levothyroxine (SYNTHROID, LEVOTHROID) 25 MCG tablet   Oral   Take 50 mcg by mouth daily.           . metFORMIN (GLUCOPHAGE) 1000 MG tablet   Oral   Take 1,000 mg by mouth 2 (two) times daily with a meal.           . metoCLOPramide (REGLAN) 10 MG tablet   Oral   Take 10 mg by mouth 2 (two) times daily.           . Multiple Vitamin (MULTIVITAMIN) tablet   Oral   Take 1 tablet by mouth daily.           . Multiple Vitamins-Minerals (VITAMINS A-D-E/SELENIUM) TABS   Oral   Take 2 tablets by mouth daily.           . niacin (NIASPAN) 500 MG CR tablet   Oral   Take 1,000 mg by mouth at bedtime.           Marland Kitchen oseltamivir (TAMIFLU) 75 MG capsule   Oral   Take 1 capsule (75 mg total) by mouth every 12 (twelve) hours.   10 capsule   0   . oxyCODONE (OXYCONTIN) 10 MG 12 hr tablet   Oral   Take 10 mg by mouth every 4 (four) hours.           Marland Kitchen oxymetazoline (AFRIN) 0.05 % nasal spray   Nasal   Place 2 sprays into the nose 2 (two) times daily.           . phenylephrine (NEO-SYNEPHRINE) 0.25 % nasal spray   Nasal   Place 1 spray into the nose every 4 (four) hours as needed. Nasal congestion           . promethazine (PHENERGAN) 25 MG tablet   Oral   Take 12.5-25 mg by mouth  every 8 (eight) hours as needed. Nausea           . rosuvastatin (CRESTOR) 40 MG tablet   Oral   Take 40 mg by mouth daily.           . sucralfate (CARAFATE) 1 G tablet   Oral   Take 1 g by mouth 2 (two) times daily.           . vitamin B-12 (CYANOCOBALAMIN) 1000 MCG tablet   Oral   Take 1,000 mcg by mouth daily.           . Vitamin D, Ergocalciferol, (DRISDOL) 50000 UNITS CAPS   Oral   Take 50,000 Units by mouth every 7 (seven) days.             Triage Vitals: BP 143/72  Pulse 86  Temp(Src) 97.9 F (36.6 C)  (Oral)  Resp 22  SpO2 97%  Physical Exam  Nursing note and vitals reviewed. Constitutional: She is oriented to person, place, and time. She appears well-developed and well-nourished.  Morbidly obese  HENT:  Head: Normocephalic and atraumatic.  Eyes: EOM are normal. Pupils are equal, round, and reactive to light.  Neck: Normal range of motion. Neck supple.  Cardiovascular: Normal rate, normal heart sounds and intact distal pulses.   Pulmonary/Chest: Effort normal and breath sounds normal. She exhibits tenderness (left chest wall).  Abdominal: Bowel sounds are normal. She exhibits no distension. There is no tenderness.  Musculoskeletal: Normal range of motion. She exhibits tenderness. She exhibits no edema.  Neurological: She is alert and oriented to person, place, and time. She has normal strength. No cranial nerve deficit or sensory deficit.  Skin: Skin is warm and dry. No rash noted.  Psychiatric: She has a normal mood and affect.    ED Course  Procedures (including critical care time) DIAGNOSTIC STUDIES: Oxygen Saturation is 92% on room air, adequate by my interpretation.    COORDINATION OF CARE: 4:13 PM Discussed ED treatment with pt and pt agrees.    Labs Reviewed  CBC WITH DIFFERENTIAL  BASIC METABOLIC PANEL  TROPONIN I    Dg Chest 2 View  11/11/2012  *RADIOLOGY REPORT*  Clinical Data: Lambert Mody left side chest pain for 2 days, pain into neck, associated shortness of breath, history asthma, hypertension, diabetes, GERD  CHEST - 2 VIEW  Comparison: 02/28/2012  Findings: Normal heart size, mediastinal contours, and pulmonary vascularity. Azygos fissure noted. Lungs clear. No pleural effusion or pneumothorax. Bones unremarkable.  IMPRESSION: No acute abnormalities.   Original Report Authenticated By: Ulyses Southward, M.D.      1. Chest pain       MDM   Date: 11/11/2012  Rate: 85  Rhythm: normal sinus rhythm  QRS Axis: left  Intervals: normal  ST/T Wave abnormalities:  nonspecific T wave changes  Conduction Disutrbances:none  Narrative Interpretation:   Old EKG Reviewed: unchanged   Pt with atypical pain, but multiple risk factors. Labs and imaging in the ED neg. Plan admission for rule out. Pt's PCP is with [facility], but she prefers [facility].      I personally performed the services described in this documentation, which was scribed in my presence. The recorded information has been reviewed and is accurate.         Charles B. Bernette Mayers, MD 11/11/12 [phone]

## 2012-11-12 LAB — CBC
HCT: 41.5 % (ref 36.0–46.0)
Hemoglobin: 13.4 g/dL (ref 12.0–15.0)
MCH: 29.3 pg (ref 26.0–34.0)
MCHC: 32.3 g/dL (ref 30.0–36.0)
MCV: 90.8 fL (ref 78.0–100.0)
Platelets: 168 10*3/uL (ref 150–400)
RBC: 4.57 MIL/uL (ref 3.87–5.11)
RDW: 14.1 % (ref 11.5–15.5)
WBC: 8.6 10*3/uL (ref 4.0–10.5)

## 2012-11-12 LAB — HEMOGLOBIN A1C
Hgb A1c MFr Bld: 8.3 % — ABNORMAL HIGH (ref <5.7)
Mean Plasma Glucose: 192 mg/dL — ABNORMAL HIGH (ref <117)

## 2012-11-12 LAB — BASIC METABOLIC PANEL
BUN: 10 mg/dL (ref 6–23)
CO2: 27 mEq/L (ref 19–32)
Calcium: 9.5 mg/dL (ref 8.4–10.5)
Chloride: 98 mEq/L (ref 96–112)
Creatinine, Ser: 0.7 mg/dL (ref 0.50–1.10)
GFR calc Af Amer: >90 mL/min (ref >90)
GFR calc non Af Amer: >90 mL/min (ref >90)
Glucose, Bld: 199 mg/dL — ABNORMAL HIGH (ref 70–99)
Potassium: 4 mEq/L (ref 3.5–5.1)
Sodium: 137 mEq/L (ref 135–145)

## 2012-11-12 LAB — TROPONIN I
Troponin I: <0.3 ng/mL (ref <0.30)
Troponin I: <0.3 ng/mL (ref <0.30)

## 2012-11-12 LAB — GLUCOSE, CAPILLARY
Glucose-Capillary: 174 mg/dL — ABNORMAL HIGH (ref 70–99)
Glucose-Capillary: 189 mg/dL — ABNORMAL HIGH (ref 70–99)
Glucose-Capillary: 150 mg/dL — ABNORMAL HIGH (ref 70–99)
Glucose-Capillary: 227 mg/dL — ABNORMAL HIGH (ref 70–99)

## 2012-11-12 LAB — D-DIMER, QUANTITATIVE: D-Dimer, Quant: <0.27 ug/mL-FEU (ref 0.00–0.48)

## 2012-11-12 MED ORDER — MAGNESIUM OXIDE 400 (241.3 MG) MG PO TABS
800.0000 mg | ORAL_TABLET | Freq: Every day | ORAL | Status: DC
  Administered 2012-11-13: 800 mg via ORAL
  Filled 2012-11-12 (×3): qty 2

## 2012-11-12 MED ORDER — OXYCODONE HCL 5 MG PO TABS
5.0000 mg | ORAL_TABLET | Freq: Four times a day (QID) | ORAL | Status: DC | PRN
  Administered 2012-11-12 – 2012-11-14 (×5): 5 mg via ORAL
  Filled 2012-11-12 (×5): qty 1

## 2012-11-12 MED ORDER — ACTIVE PARTNERSHIP FOR HEALTH OF YOUR HEART BOOK
Freq: Once | Status: AC
  Administered 2012-11-12: 04:00:00
  Filled 2012-11-12: qty 1

## 2012-11-12 MED ORDER — HYDROXYCHLOROQUINE SULFATE 200 MG PO TABS
200.0000 mg | ORAL_TABLET | Freq: Two times a day (BID) | ORAL | Status: DC
  Administered 2012-11-12 – 2012-11-14 (×6): 200 mg via ORAL
  Filled 2012-11-12 (×7): qty 1

## 2012-11-12 MED ORDER — MAGNESIUM OXIDE 400 (241.3 MG) MG PO TABS
400.0000 mg | ORAL_TABLET | Freq: Every morning | ORAL | Status: DC
  Administered 2012-11-13 – 2012-11-14 (×2): 400 mg via ORAL
  Filled 2012-11-12 (×2): qty 1

## 2012-11-12 NOTE — Progress Notes (Signed)
Inpatient Diabetes Program Recommendations  AACE/ADA: New Consensus Statement on Inpatient Glycemic Control (2013)  Target Ranges:  Prepandial:   less than 140 mg/dL      Peak postprandial:   less than 180 mg/dL (1-2 hours)      Critically ill patients:  140 - 180 mg/dL   Results for [name], [name] (MRN [id number]) as of 11/12/2012 10:05  Ref. Range 12/06/2008 04:55  Hemoglobin A1C Latest Range: 4.6-6.1 % 9.5... (H)    Inpatient Diabetes Program Recommendations HgbA1C: Please consider ordering an A1C to determine glycemic control over the last 2-3 months.  Last A1C in the chart was 9.5 on 12/06/2008.  Thanks, Orlando Penner, RN, BSN, CCRN Diabetes Coordinator Inpatient Diabetes Program [phone]

## 2012-11-12 NOTE — Progress Notes (Signed)
[facility] PROGRESS NOTE  [name] ZOX:[id number] DOB: 18-Apr-1961 DOA: 11/11/2012 PCP: No primary provider on file.  Brief Narrative: [name] is a 52 y.o. female with history of diabetes mellitus type 2, hypertension, asthma, mixed connective tissue disorder, hypothyroidism presented to the ER in [facility] with complaints of chest pain which has been ongoing for last 3 days. The pain is mostly on the left anterior chest wall sometimes radiating to her neck with occasional shortness of breath and diaphoresis. Each time the chest pain lasted for half an hour. Felt like stabbing in nature. Denies any palpitation dizziness fever chills per her to cough nausea vomiting abdominal pain. EKG chest x-ray cardiac enzymes were unremarkable but given patient's risk factors patient has been admitted for further management. Patient has had a stress test last year which has to the patient was normal.  Assessment/Plan: 1. Chest pain - given patient's multiple risk factors at this time we will rule out ACS. CE negative. D-dimer negative. Check 2-D echo, if normal can be d/c. Aspirin. Nitroglycerin when necessary. 2. Diabetes mellitus type 2 - continue present medications. Check CBG with sliding scale coverage. Check HBA1C 3. Hypertension - continue present medications. 4. Asthma - presently not wheezing. 5. Mixed connective tissue disorder - continue home medication. 6. Hypothyroidism - check TSH. Continue Synthroid.  Code Status: Full Family Communication: none  Disposition Plan: home 3/5  Consultants:  none  Procedures:  2D echo pending  Antibiotics:  none  HPI/Subjective: - no chest pain this morning.   Objective: Filed Vitals:   11/12/12 0430 11/12/12 0528 11/12/12 0541 11/12/12 0821  BP:   135/70   Pulse:   80   Temp:   98.1 F (36.7 C)   TempSrc:   Oral   Resp:   18   Height:      Weight: 134.2 kg (295 lb 13.7 oz)     SpO2:  95% 97% 96%    Intake/Output  Summary (Last 24 hours) at 11/12/12 1129 Last data filed at 11/12/12 8119  Gross per 24 hour  Intake      0 ml  Output    300 ml  Net   -300 ml   Filed Weights   11/11/12 2052 11/12/12 0430  Weight: 134.3 kg (296 lb 1.2 oz) 134.2 kg (295 lb 13.7 oz)    Exam:   General:  NAD, obese caucasian female  Cardiovascular: regular rate and rhythm, without MRG  Respiratory: good air movement, clear to auscultation throughout, no wheezing, ronchi or rales, exam somewhat limited due to body habitus.   Abdomen: soft, not tender to palpation, positive bowel sounds  MSK: no peripheral edema  Neuro: CN 2-12 grossly intact, MS 5/5 in all 4  Data Reviewed: Basic Metabolic Panel:  Recent Labs Lab 11/11/12 1607 11/11/12 2244 11/12/12 0430  NA 139  --  137  K 3.9  --  4.0  CL 98  --  98  CO2 27  --  27  GLUCOSE 174*  --  199*  BUN 11  --  10  CREATININE 0.70 0.76 0.70  CALCIUM 9.9  --  9.5   CBC:  Recent Labs Lab 11/11/12 1607 11/11/12 2244 11/12/12 0430  WBC 8.4 8.8 8.6  NEUTROABS 4.0  --   --   HGB 14.2 14.0 13.4  HCT 43.8 41.8 41.5  MCV 90.5 89.3 90.8  PLT 169 174 168   Cardiac Enzymes:  Recent Labs Lab 11/11/12 1607 11/11/12 2244 11/12/12  0430 11/12/12 0919  TROPONINI <0.30 <0.30 <0.30 <0.30   BNP (last 3 results) No results found for this basename: PROBNP,  in the last 8760 hours CBG:  Recent Labs Lab 11/11/12 2350 11/12/12 0608  GLUCAP 257* 174*    Studies: Dg Chest 2 View  11/11/2012  *RADIOLOGY REPORT*  Clinical Data: [name] left side chest pain for 2 days, pain into neck, associated shortness of breath, history asthma, hypertension, diabetes, GERD  CHEST - 2 VIEW  Comparison: 02/28/2012  Findings: Normal heart size, mediastinal contours, and pulmonary vascularity. Azygos fissure noted. Lungs clear. No pleural effusion or pneumothorax. Bones unremarkable.  IMPRESSION: No acute abnormalities.   Original Report Authenticated By: Ulyses Southward, M.D.      Scheduled Meds: . aspirin EC  325 mg Oral Daily  . atorvastatin  80 mg Oral q1800  . clotrimazole-betamethasone  1 application Topical BID  . enalapril  20 mg Oral Daily  . enoxaparin (LOVENOX) injection  30 mg Subcutaneous Q24H  . fentaNYL  100 mcg Transdermal Q72H  . fluticasone  2 spray Each Nare BID  . gabapentin  200 mg Oral BID  . guaiFENesin  1,200 mg Oral BID  . hydroxychloroquine  200 mg Oral BID  . insulin aspart  0-15 Units Subcutaneous TID WC  . levalbuterol  1-2 puff Inhalation TID  . levothyroxine  200 mcg Oral QAC breakfast  . levothyroxine  50 mcg Oral QAC breakfast  . metoCLOPramide  10 mg Oral BID  . niacin  1,000 mg Oral QHS  . OxyCODONE  10 mg Oral Q12H  . pantoprazole  40 mg Oral QAC breakfast  . sodium chloride  3 mL Intravenous Q12H  . sodium chloride  3 mL Intravenous Q12H  . sucralfate  1 g Oral BID  . vitamin B-12  1,000 mcg Oral Daily  . Vitamin D (Ergocalciferol)  50,000 Units Oral Q7 days   Continuous Infusions:   Principal Problem:   Chest pain Active Problems:   Type II or unspecified type diabetes mellitus without mention of complication, not stated as uncontrolled   Essential hypertension, benign   Other and unspecified hyperlipidemia   Mixed connective tissue disease   Unspecified hypothyroidism   Asthma  [name] Pert, MD [facility] Pager [phone]. If 7 PM - 7 AM, please contact night-coverage at [email or web address], password [facility] 11/12/2012, 11:29 AM  LOS: 1 day

## 2012-11-12 NOTE — Progress Notes (Signed)
  Echocardiogram 2D Echocardiogram has been performed.  [name], [name] 11/12/2012, 6:02 PM

## 2012-11-13 LAB — GLUCOSE, CAPILLARY
Glucose-Capillary: 179 mg/dL — ABNORMAL HIGH (ref 70–99)
Glucose-Capillary: 214 mg/dL — ABNORMAL HIGH (ref 70–99)
Glucose-Capillary: 154 mg/dL — ABNORMAL HIGH (ref 70–99)
Glucose-Capillary: 131 mg/dL — ABNORMAL HIGH (ref 70–99)

## 2012-11-13 MED ORDER — LEVALBUTEROL TARTRATE 45 MCG/ACT IN AERO: 1.0000 | INHALATION_SPRAY | Freq: Four times a day (QID) | RESPIRATORY_TRACT | Status: DC | PRN

## 2012-11-13 MED ORDER — INSULIN ASPART 100 UNIT/ML ~~LOC~~ SOLN
10.0000 [IU] | Freq: Three times a day (TID) | SUBCUTANEOUS | Status: DC
  Administered 2012-11-13 – 2012-11-14 (×3): 10 [IU] via SUBCUTANEOUS

## 2012-11-13 MED ORDER — LEVALBUTEROL HCL 1.25 MG/0.5ML IN NEBU
1.2500 mg | INHALATION_SOLUTION | Freq: Three times a day (TID) | RESPIRATORY_TRACT | Status: DC
  Administered 2012-11-13 – 2012-11-14 (×2): 1.25 mg via RESPIRATORY_TRACT
  Filled 2012-11-13 (×6): qty 0.5

## 2012-11-13 MED ORDER — INSULIN ASPART 100 UNIT/ML ~~LOC~~ SOLN: 5.0000 [IU] | Freq: Every day | SUBCUTANEOUS | Status: DC

## 2012-11-13 NOTE — Consult Note (Addendum)
Admit date: 11/11/2012 Referring Physician  Dr. Susie Cassette Primary Physician   Primary Cardiologist  Burlene Arnt, PA Reason for Consultation  Chest pain  HPI: 52 y/o who has had a long history of atypical chest pain.  SHe follows with a cardiologist in [place].  She ahd a stress test in 2012 and this was normal.  SHe has had some sharp pains in her chest that come on at random times.  NO exertional sx.  No nausea, diaphoresis or syncope with the pain.  It has been on and off in [facility].  SHe has ruled out for MI.   There was  Question of decreased LV function on her most recent echo so we are consulted to see what further w/u is needed.    Currently, she is pain free.     PMH:   Past Medical History  Diagnosis Date  . Asthma   . Hypertension   . Diabetes mellitus   . IBS (irritable bowel syndrome)   . GERD (gastroesophageal reflux disease)   . Eczema   . Thyroid disease   . Mixed connective tissue disease      PSH:   Past Surgical History  Procedure Laterality Date  . Breast surgery    . Cholecystectomy    . Abdominal hysterectomy      Allergies:  Fish allergy; Gabapentin; Peanut-containing drug products; Atrovent; Crestor; Ipratropium-albuterol; and Penicillins Prior to Admit Meds:   Prescriptions prior to admission  Medication Sig Dispense Refill  . albuterol (PROVENTIL HFA;VENTOLIN HFA) 108 (90 BASE) MCG/ACT inhaler Inhale 2 puffs into the lungs every 4 (four) hours as needed for wheezing or shortness of breath.      Marland Kitchen aspirin EC 81 MG tablet Take 81 mg by mouth daily.      . Cholecalciferol (VITAMIN D3) 10000 UNITS capsule Take 10,000 Units by mouth daily.      . enalapril (VASOTEC) 10 MG tablet Take 10 mg by mouth 2 (two) times daily.      . Estrogens, Conjugated (PREMARIN VA) Place 1 application vaginally 3 (three) times a week. On Saturdays, Mondays, and Wednesday (cream)      . fentaNYL (DURAGESIC - DOSED MCG/HR) 100 MCG/HR Place 1 patch onto the skin every 3 (three) days.         . fluticasone (FLONASE) 50 MCG/ACT nasal spray Place 2 sprays into the nose 2 (two) times daily.        Marland Kitchen gabapentin (NEURONTIN) 100 MG capsule Take 600 mg by mouth 2 (two) times daily.      . Guaifenesin (MUCINEX MAXIMUM STRENGTH) 1200 MG TB12 Take 1,200 mg by mouth 2 (two) times daily.      . insulin regular human CONCENTRATED (HUMULIN R) 500 UNIT/ML SOLN injection Inject 10-40 Units into the skin 3 (three) times daily with meals. Based on sliding scale      . levalbuterol (XOPENEX) 1.25 MG/0.5ML nebulizer solution Take 1 ampule by nebulization 4 (four) times daily as needed for wheezing or shortness of breath.      . levothyroxine (SYNTHROID, LEVOTHROID) 200 MCG tablet Take 200 mcg by mouth daily. Take with 2 25 mcg tablets for a 250 mcg dose      . levothyroxine (SYNTHROID, LEVOTHROID) 25 MCG tablet Take 50 mcg by mouth daily. Take with 200 mcg tablet for a 250 mcg dose      . metFORMIN (GLUCOPHAGE) 1000 MG tablet Take 1,000 mg by mouth 2 (two) times daily with a meal.        .  metoCLOPramide (REGLAN) 10 MG tablet Take 10 mg by mouth 2 (two) times daily.        . Multiple Vitamin (MULTIVITAMIN WITH MINERALS) TABS Take 1 tablet by mouth daily.      . niacin (NIASPAN) 500 MG CR tablet Take 1,000 mg by mouth at bedtime.        Marland Kitchen omeprazole (PRILOSEC) 40 MG capsule Take 40 mg by mouth daily.      . Oxycodone HCl 10 MG TABS Take 10 mg by mouth every 4 (four) hours as needed (for pain).      Marland Kitchen oxymetazoline (AFRIN) 0.05 % nasal spray Place 2 sprays into the nose 2 (two) times daily.        . sucralfate (CARAFATE) 1 G tablet Take 1 g by mouth 2 (two) times daily.        . vitamin B-12 (CYANOCOBALAMIN) 1000 MCG tablet Take 1,000 mcg by mouth daily.         Fam HX:    Family History  Problem Relation Age of Onset  . CAD Mother   . Hypertension Mother   . CAD Father    Social HX:    History   Social History  . Marital Status: Married    Spouse Name: N/A    Number of Children: N/A  .  Years of Education: N/A   Occupational History  . Not on file.   Social History Main Topics  . Smoking status: Never Smoker   . Smokeless tobacco: Not on file  . Alcohol Use: No  . Drug Use: No  . Sexually Active: Not on file   Other Topics Concern  . Not on file   Social History Narrative  . No narrative on file     ROS:  All 11 ROS were addressed and are negative except what is stated in the HPI  Physical Exam: Blood pressure 142/79, pulse 79, temperature 98.1 F (36.7 C), temperature source Oral, resp. rate 18, height 5\' 2"  (1.575 m), weight 136.1 kg (300 lb 0.7 oz), SpO2 98.00%.    General: Well developed, well nourished, in no acute distress Head:    Normal cephalic and atramatic  Lungs:   Clear bilaterally to auscultation and percussion. Heart:   HRRR S1 S2  Abdomen: obese Msk:  B. Normal strength and tone for age. Extremities: No  edema.   Neuro: Alert and oriented X 3. Psych:  Normal affect, responds appropriately    Labs:   Lab Results  Component Value Date   WBC 8.6 11/12/2012   HGB 13.4 11/12/2012   HCT 41.5 11/12/2012   MCV 90.8 11/12/2012   PLT 168 11/12/2012    Recent Labs Lab 11/12/12 0430  NA 137  K 4.0  CL 98  CO2 27  BUN 10  CREATININE 0.70  CALCIUM 9.5  GLUCOSE 199*   No results found for this basename: PTT   Lab Results  Component Value Date   INR 0.98 06/06/2011   INR 1.20 08/03/2010   Lab Results  Component Value Date   CKTOTAL 84 06/06/2011   CKMB 2.6 06/06/2011   TROPONINI <0.30 11/12/2012     No results found for this basename: CHOL   No results found for this basename: HDL   No results found for this basename: LDLCALC   No results found for this basename: TRIG   No results found for this basename: CHOLHDL   No results found for this basename: LDLDIRECT  Radiology:  No results found.  EKG:  NSR, inferior Q waves  ASSESSMENT: Atypical chest pain  PLAN:  I personally reviewed her echocardiogram.  Her LVEF is in  the 65-70% range.  No Regional wall motion abnormalities in teh apical views.  The other views are difficult to tell how the wall motion is.  There eis no exertional component to her pain.  She felt it was different than her costochondritis or GERD.  She has ruled out.  I think we are certain this was not ACS.    Would recommend continued risk factor modification including exercise and weight loss.  If sx persist, she can certainly f/u with her cardiologist in [facility] or with me if any invasive testing is needed.    No further cardiac w/u planned as long as she remains asymptomatic.  LDL target <100. BP target 130/80 given diabetes  Corky Crafts., MD  11/13/2012  7:27 PM

## 2012-11-13 NOTE — Progress Notes (Signed)
[facility] PROGRESS NOTE  [name] ZOX:[id number] DOB: 14-Dec-1960 DOA: 11/11/2012 PCP: No primary provider on file.  Assessment/Plan: Principal Problem:   Chest pain Active Problems:   Type II or unspecified type diabetes mellitus without mention of complication, not stated as uncontrolled   Essential hypertension, benign   Other and unspecified hyperlipidemia   Mixed connective tissue disease   Unspecified hypothyroidism   Asthma    1. Chest pain given significant change in her ESR to 40-45% cardiology has been consulted , Dr Eldridge Dace, continue aspirin nitroglycerin, d-dimer was negative 2. Diabetes insulin-dependent Will abnormal Dr. bedtime coverage, hemoglobin A1c of 8.3 3 Hypertension - continue present medications. 4 Asthma - presently not wheezing. 5 Mixed connective tissue disorder - continue home medication. 6 Hypothyroidism - check TSH. Continue Synthroid  Code Status: full Family Communication: family updated about patient's clinical progress Disposition Plan:  As above    Brief narrative: [name] is a 52 y.o. female who presents to the Emergency Department complaining of sharp, episodic left sided chest pain onset yesterday. Pt reports episodes lasts about 30 minutes. First episode began while pt was lying down. She reports 3-4 episodes of pain since onset. Patient denies any chest pain right now. There is associated neck pain and increased SOB. She says she only has neck pain with chest pain episodes. She states that pain feels different from past muscle pain or GERD related pain. Pt denies fever, chills, cough, nausea, vomiting, diarrhea, weakness, and any other associated symptoms. She took aspirin yesterday, but none today. Patient claims she was diagnosed with 30% stenosis of carotid artery last year at [facility] by Arnette Felts, PA. She says that she had a nuclear stress test and vascular US, but no cardiac catheterization. She has a h/o asthma,  HTN, GERD, DM and mixed connective tissue disease   Consultants:  Cardiology  Procedures:  2-D echo  Antibiotics:  None  HPI/Subjective: Currently chest pain-free, denies any shortness of breath with exertion of swelling in her legs  Objective: Filed Vitals:   11/12/12 1941 11/13/12 0017 11/13/12 0502 11/13/12 1320  BP: 119/72  144/79 142/79  Pulse: 81  77 79  Temp: 98.3 F (36.8 C)  98.3 F (36.8 C) 98.1 F (36.7 C)  TempSrc: Oral  Oral Oral  Resp: 19  18 18   Height:      Weight:   136.1 kg (300 lb 0.7 oz)   SpO2: 96% 95% 97% 98%    Intake/Output Summary (Last 24 hours) at 11/13/12 1422 Last data filed at 11/13/12 1230  Gross per 24 hour  Intake    840 ml  Output   1800 ml  Net   -960 ml    Exam:  General: Well-developed well-nourished.  Eyes: Anicteric no pallor.  ENT: No discharge from the ears eyes nose or mouth.  Neck: No mass felt.  Cardiovascular: S1-S2 heard.  Respiratory: No rhonchi or crepitations.  Abdomen: Soft nontender bowel sounds present.  Skin: No rash seen.  Musculoskeletal: No edema no effusion.  Psychiatric: Appears normal.  Neurologic: Moves all extremities.      Data Reviewed: Basic Metabolic Panel:  Recent Labs Lab 11/11/12 1607 11/11/12 2244 11/12/12 0430  NA 139  --  137  K 3.9  --  4.0  CL 98  --  98  CO2 27  --  27  GLUCOSE 174*  --  199*  BUN 11  --  10  CREATININE 0.70 0.76 0.70  CALCIUM 9.9  --  9.5    Liver Function Tests: No results found for this basename: AST, ALT, ALKPHOS, BILITOT, PROT, ALBUMIN,  in the last 168 hours No results found for this basename: LIPASE, AMYLASE,  in the last 168 hours No results found for this basename: AMMONIA,  in the last 168 hours  CBC:  Recent Labs Lab 11/11/12 1607 11/11/12 2244 11/12/12 0430  WBC 8.4 8.8 8.6  NEUTROABS 4.0  --   --   HGB 14.2 14.0 13.4  HCT 43.8 41.8 41.5  MCV 90.5 89.3 90.8  PLT 169 174 168    Cardiac Enzymes:  Recent Labs Lab  11/11/12 1607 11/11/12 2244 11/12/12 0430 11/12/12 0919  TROPONINI <0.30 <0.30 <0.30 <0.30   BNP (last 3 results) No results found for this basename: PROBNP,  in the last 8760 hours   CBG:  Recent Labs Lab 11/12/12 1100 11/12/12 1612 11/12/12 2106 11/13/12 0611 11/13/12 1123  GLUCAP 189* 150* 227* 179* 214*    No results found for this or any previous visit (from the past 240 hour(s)).   Studies: Dg Chest 2 View  11/11/2012  *RADIOLOGY REPORT*  Clinical Data: Lambert Mody left side chest pain for 2 days, pain into neck, associated shortness of breath, history asthma, hypertension, diabetes, GERD  CHEST - 2 VIEW  Comparison: 02/28/2012  Findings: Normal heart size, mediastinal contours, and pulmonary vascularity. Azygos fissure noted. Lungs clear. No pleural effusion or pneumothorax. Bones unremarkable.  IMPRESSION: No acute abnormalities.   Original Report Authenticated By: Ulyses Southward, M.D.     Scheduled Meds: . aspirin EC  325 mg Oral Daily  . atorvastatin  80 mg Oral q1800  . clotrimazole-betamethasone  1 application Topical BID  . enalapril  20 mg Oral Daily  . enoxaparin (LOVENOX) injection  30 mg Subcutaneous Q24H  . fentaNYL  100 mcg Transdermal Q72H  . fluticasone  2 spray Each Nare BID  . gabapentin  200 mg Oral BID  . guaiFENesin  1,200 mg Oral BID  . hydroxychloroquine  200 mg Oral BID  . insulin aspart  0-15 Units Subcutaneous TID WC  . insulin aspart  5 Units Subcutaneous QHS  . levalbuterol  1-2 puff Inhalation TID  . levothyroxine  200 mcg Oral QAC breakfast  . levothyroxine  50 mcg Oral QAC breakfast  . magnesium oxide  400 mg Oral q morning - 10a  . magnesium oxide  800 mg Oral QPC supper  . metoCLOPramide  10 mg Oral BID  . niacin  1,000 mg Oral QHS  . OxyCODONE  10 mg Oral Q12H  . pantoprazole  40 mg Oral QAC breakfast  . sodium chloride  3 mL Intravenous Q12H  . sodium chloride  3 mL Intravenous Q12H  . sucralfate  1 g Oral BID  . vitamin B-12  1,000  mcg Oral Daily  . Vitamin D (Ergocalciferol)  50,000 Units Oral Q7 days   Continuous Infusions:   Principal Problem:   Chest pain Active Problems:   Type II or unspecified type diabetes mellitus without mention of complication, not stated as uncontrolled   Essential hypertension, benign   Other and unspecified hyperlipidemia   Mixed connective tissue disease   Unspecified hypothyroidism   Asthma    Time spent: 40 minutes   [employer]  [facility] Pager [phone]. If 8PM-8AM, please contact night-coverage at [email or web address], password [facility] 11/13/2012, 2:22 PM  LOS: 2 days

## 2012-11-13 NOTE — Care Management Note (Signed)
    Page 1 of 1   11/14/2012     4:13:01 PM   CARE MANAGEMENT NOTE 11/14/2012  Patient:  [name],[name]   Account Number:  [id number]  Date Initiated:  11/13/2012  Documentation initiated by:  AMERSON,JULIE  Subjective/Objective Assessment:   PT ADM WITH CHEST PAIN ON 11/11/12.  PTA, PT INDEPENDENT OF ADLS.     Action/Plan:   WILL FOLLOW FOR HOME NEEDS AS PT PROGRESSES.   Anticipated DC Date:  11/13/2012   Anticipated DC Plan:  HOME/SELF CARE      DC Planning Services  CM consult      Choice offered to / List presented to:             Status of service:  Completed, signed off Medicare Important Message given?   (If response is "NO", the following Medicare IM given date fields will be blank) Date Medicare IM given:   Date Additional Medicare IM given:    Discharge Disposition:  HOME/SELF CARE  Per UR Regulation:  Reviewed for med. necessity/level of care/duration of stay  If discussed at Long Length of Stay Meetings, dates discussed:    Comments:

## 2012-11-13 NOTE — Progress Notes (Signed)
Pt requesting to have her choice of either Nebulizer treatments and Inhaler. Per pt, depending on how she feels at the time determines which method she uses. I changed med orders to reflect Xopenex inhaler PRN and Xopenex 1.25mg  TID to be sure we had meds on the floor for whichever choice pt wanted. Pt satisfied with the schedule and device. RT will continue to monitor.

## 2012-11-13 NOTE — Progress Notes (Signed)
Inpatient Diabetes Program Recommendations  AACE/ADA: New Consensus Statement on Inpatient Glycemic Control (2013)  Target Ranges:  Prepandial:   less than 140 mg/dL      Peak postprandial:   less than 180 mg/dL (1-2 hours)      Critically ill patients:  140 - 180 mg/dL   Results for [name], [name] (MRN [id number]) as of 11/13/2012 08:59  Ref. Range 11/11/2012 23:50 11/12/2012 06:08 11/12/2012 11:00 11/12/2012 16:12 11/12/2012 21:06 11/13/2012 06:11  Glucose-Capillary Latest Range: 70-99 mg/dL 409 (H) 811 (H) 914 (H) 150 (H) 227 (H) 179 (H)   Inpatient Diabetes Program Recommendations Correction (SSI): Please consider adding Novolog bedtime correction.   Note: Patient is currently ordered to receive Novolog moderate correction AC but does not have any bedtime correction ordered.  Over the last two nights blood glucose has been 257 mg/dl on 3/3 and 782 mg/dl on 3/4.  Please consider adding Novolog bedtime correction to improve fasting glucose.  Will continue to follow.  Thanks, Orlando Penner, RN, BSN, CCRN Diabetes Coordinator Inpatient Diabetes Program [phone]

## 2012-11-14 LAB — GLUCOSE, CAPILLARY
Glucose-Capillary: 186 mg/dL — ABNORMAL HIGH (ref 70–99)
Glucose-Capillary: 187 mg/dL — ABNORMAL HIGH (ref 70–99)

## 2012-11-14 MED ORDER — ALBUTEROL SULFATE HFA 108 (90 BASE) MCG/ACT IN AERS: 2.0000 | INHALATION_SPRAY | RESPIRATORY_TRACT | Status: DC | PRN

## 2012-11-14 MED ORDER — ROSUVASTATIN CALCIUM 40 MG PO TABS: 40.0000 mg | ORAL_TABLET | Freq: Every day | ORAL | Status: DC

## 2012-11-14 NOTE — Progress Notes (Signed)
Pt d/c instructions reviewed with pt and husband. Copy of instructions and scripts given to pt. Pt d/c'd via wheelchair with belongings escorted by hospital volunteer.

## 2012-11-14 NOTE — Discharge Summary (Signed)
Physician Discharge Summary  [name] MRN: [id number] DOB/AGE: 11/25/1960 [name] y.o.  PCP: No primary provider on file.   Admit date: 11/11/2012 Discharge date: 11/14/2012  Discharge Diagnoses:  Atypical chest pain Active Problems:   Type II or unspecified type diabetes mellitus without mention of complication, not stated as uncontrolled   Essential hypertension, benign   Other and unspecified hyperlipidemia   Mixed connective tissue disease   Unspecified hypothyroidism   Asthma     Medication List    STOP taking these medications       oxymetazoline 0.05 % nasal spray  Commonly known as:  AFRIN      TAKE these medications       albuterol 108 (90 BASE) MCG/ACT inhaler  Commonly known as:  PROVENTIL HFA;VENTOLIN HFA  Inhale 2 puffs into the lungs every 4 (four) hours as needed for wheezing or shortness of breath.     aspirin EC 81 MG tablet  Take 81 mg by mouth daily.     enalapril 10 MG tablet  Commonly known as:  VASOTEC  Take 10 mg by mouth 2 (two) times daily.     fentaNYL 100 MCG/HR  Commonly known as:  DURAGESIC - dosed mcg/hr  Place 1 patch onto the skin every 3 (three) days.     fluticasone 50 MCG/ACT nasal spray  Commonly known as:  FLONASE  Place 2 sprays into the nose 2 (two) times daily.     gabapentin 100 MG capsule  Commonly known as:  NEURONTIN  Take 600 mg by mouth 2 (two) times daily.     HUMULIN R 500 UNIT/ML Soln injection  Generic drug:  insulin regular human CONCENTRATED  Inject 10-40 Units into the skin 3 (three) times daily with meals. Based on sliding scale     levalbuterol 1.25 MG/0.5ML nebulizer solution  Commonly known as:  XOPENEX  Take 1 ampule by nebulization 4 (four) times daily as needed for wheezing or shortness of breath.     levothyroxine 200 MCG tablet  Commonly known as:  SYNTHROID, LEVOTHROID  Take 200 mcg by mouth daily. Take with 2 25 mcg tablets for a 250 mcg dose     levothyroxine 25 MCG tablet  Commonly  known as:  SYNTHROID, LEVOTHROID  Take 50 mcg by mouth daily. Take with 200 mcg tablet for a 250 mcg dose     metFORMIN 1000 MG tablet  Commonly known as:  GLUCOPHAGE  Take 1,000 mg by mouth 2 (two) times daily with a meal.     metoCLOPramide 10 MG tablet  Commonly known as:  REGLAN  Take 10 mg by mouth 2 (two) times daily.     MUCINEX MAXIMUM STRENGTH 1200 MG Tb12  Generic drug:  Guaifenesin  Take 1,200 mg by mouth 2 (two) times daily.     multivitamin with minerals Tabs  Take 1 tablet by mouth daily.     niacin 500 MG CR tablet  Commonly known as:  NIASPAN  Take 1,000 mg by mouth at bedtime.     omeprazole 40 MG capsule  Commonly known as:  PRILOSEC  Take 40 mg by mouth daily.     Oxycodone HCl 10 MG Tabs  Take 10 mg by mouth every 4 (four) hours as needed (for pain).     PREMARIN VA  Place 1 application vaginally 3 (three) times a week. On Saturdays, Mondays, and Wednesday (cream)     rosuvastatin 40 MG tablet  Commonly known as:  CRESTOR  Take 1 tablet (40 mg total) by mouth daily.     sucralfate 1 G tablet  Commonly known as:  CARAFATE  Take 1 g by mouth 2 (two) times daily.     vitamin B-12 1000 MCG tablet  Commonly known as:  CYANOCOBALAMIN  Take 1,000 mcg by mouth daily.     Vitamin D3 10000 UNITS capsule  Take 10,000 Units by mouth daily.        Discharge Condition: Stable  Disposition: 01-Home or Self Care   Consults: Cardiology  Significant Diagnostic Studies: Dg Chest 2 View  11/11/2012  *RADIOLOGY REPORT*  Clinical Data: Lambert Mody left side chest pain for 2 days, pain into neck, associated shortness of breath, history asthma, hypertension, diabetes, GERD  CHEST - 2 VIEW  Comparison: 02/28/2012  Findings: Normal heart size, mediastinal contours, and pulmonary vascularity. Azygos fissure noted. Lungs clear. No pleural effusion or pneumothorax. Bones unremarkable.  IMPRESSION: No acute abnormalities.   Original Report Authenticated By: Ulyses Southward, M.D.      2-D echo Her LVEF is in the 65-70% range. No Regional wall motion abnormalities in teh apical views. The other views are difficult to tell how the wall motion is.  Microbiology: No results found for this or any previous visit (from the past 240 hour(s)).   Labs: Results for orders placed during the hospital encounter of 11/11/12 (from the past 48 hour(s))  TROPONIN I     Status: None   Collection Time    11/12/12  9:19 AM      Result Value Range   Troponin I <0.30  <0.30 ng/mL   Comment:            Due to the release kinetics of cTnI,     a negative result within the first hours     of the onset of symptoms does not rule out     myocardial infarction with certainty.     If myocardial infarction is still suspected,     repeat the test at appropriate intervals.  GLUCOSE, CAPILLARY     Status: Abnormal   Collection Time    11/12/12 11:00 AM      Result Value Range   Glucose-Capillary 189 (*) 70 - 99 mg/dL   Comment 1 Documented in Chart     Comment 2 Notify RN    HEMOGLOBIN A1C     Status: Abnormal   Collection Time    11/12/12 12:00 PM      Result Value Range   Hemoglobin A1C 8.3 (*) <5.7 %   Comment: (NOTE)                                                                               According to the ADA Clinical Practice Recommendations for 2011, when     HbA1c is used as a screening test:      >=6.5%   Diagnostic of Diabetes Mellitus               (if abnormal result is confirmed)     5.7-6.4%   Increased risk of developing Diabetes Mellitus     References:Diagnosis and Classification of Diabetes Mellitus,Diabetes  ZOXW,9604,54(UJWJX 1):S62-S69 and Standards of Medical Care in             Diabetes - 2011,Diabetes Care,2011,34 (Suppl 1):S11-S61.   Mean Plasma Glucose 192 (*) <117 mg/dL  GLUCOSE, CAPILLARY     Status: Abnormal   Collection Time    11/12/12  4:12 PM      Result Value Range   Glucose-Capillary 150 (*) 70 - 99 mg/dL   Comment 1 Documented in Chart      Comment 2 Notify RN    GLUCOSE, CAPILLARY     Status: Abnormal   Collection Time    11/12/12  9:06 PM      Result Value Range   Glucose-Capillary 227 (*) 70 - 99 mg/dL   Comment 1 Notify RN    GLUCOSE, CAPILLARY     Status: Abnormal   Collection Time    11/13/12  6:11 AM      Result Value Range   Glucose-Capillary 179 (*) 70 - 99 mg/dL  GLUCOSE, CAPILLARY     Status: Abnormal   Collection Time    11/13/12 11:23 AM      Result Value Range   Glucose-Capillary 214 (*) 70 - 99 mg/dL   Comment 1 Notify RN     Comment 2 Documented in Chart    GLUCOSE, CAPILLARY     Status: Abnormal   Collection Time    11/13/12  4:[name] PM      Result Value Range   Glucose-Capillary 154 (*) 70 - 99 mg/dL  GLUCOSE, CAPILLARY     Status: Abnormal   Collection Time    11/13/12  9:14 PM      Result Value Range   Glucose-Capillary 131 (*) 70 - 99 mg/dL  GLUCOSE, CAPILLARY     Status: Abnormal   Collection Time    11/14/12  5:59 AM      Result Value Range   Glucose-Capillary 186 (*) 70 - 99 mg/dL     HPI : [name] who has had a long history of atypical chest pain. SHe follows with a cardiologist in [place]. She ahd a stress test in 2012 and this was normal. SHe has had some sharp pains in her chest that come on at random times. NO exertional sx. No nausea, diaphoresis or syncope with the pain.   SHe has ruled out for MI. There was Question of decreased LV function of 40-45% on her most recent echo  And cardiology was consulted   HOSPITAL COURSE:  #1 chest pain Cardiology was consulted after initial workup including d-dimer and telemetry and cardiac enzymes were negative Dr. Eldridge Dace reviewed the 2-D echo LVEF is in the 65-70% range. No Regional wall motion abnormalities in teh apical views. The other views are difficult to tell how the wall motion is. There eis no exertional component to her pain. She felt it was different than her costochondritis or GERD. She has ruled out for  acute coronary syndrome He recommended no further cardiac workup He recommended exercise and weight loss Followup with cardiology in [facility] for a stress test or invasive workup if symptoms continue  LDL target <100.  BP target 130/80 given diabetes   #2 insulin-dependent diabetes Hemoglobin A1c of 8.3, her hemoglobin A1c has increased from 7.2 Patient is to continue with Humulin R. She needs close followup of her PCP and her repeat lipid panel during her next visit    Discharge Exam:  Blood pressure 121/64, pulse 76, temperature  97.8 F (36.6 C), temperature source Oral, resp. rate 18, height 5\' 2"  (1.575 m), weight 133.7 kg (294 lb 12.1 oz), SpO2 97.00%.   General: Well developed, well nourished, in no acute distress  Head: Normal cephalic and atramatic  Lungs: Clear bilaterally to auscultation and percussion.  Heart: HRRR S1 S2  Abdomen: obese  Msk: B. Normal strength and tone for age.  Extremities: No edema.  Neuro: Alert and oriented X 3.  Psych: Normal affect, responds appropriately       Signed: ABROL,NAYANA 11/14/2012, 9:03 AM

## 2013-03-26 ENCOUNTER — Other Ambulatory Visit (HOSPITAL_COMMUNITY): Payer: Self-pay | Admitting: Specialist

## 2013-03-26 DIAGNOSIS — M542 Cervicalgia: Secondary | ICD-10-CM

## 2013-04-01 ENCOUNTER — Ambulatory Visit (HOSPITAL_COMMUNITY): Payer: BC Managed Care – PPO

## 2013-04-05 ENCOUNTER — Encounter (HOSPITAL_BASED_OUTPATIENT_CLINIC_OR_DEPARTMENT_OTHER): Payer: Self-pay | Admitting: *Deleted

## 2013-04-05 ENCOUNTER — Ambulatory Visit (HOSPITAL_BASED_OUTPATIENT_CLINIC_OR_DEPARTMENT_OTHER)
Admission: RE | Admit: 2013-04-05 | Discharge: 2013-04-05 | Disposition: A | Discharge status: Home / Self Care | Payer: BC Managed Care – PPO | Source: Ambulatory Visit | Attending: Specialist | Admitting: Specialist

## 2013-04-05 ENCOUNTER — Emergency Department (HOSPITAL_BASED_OUTPATIENT_CLINIC_OR_DEPARTMENT_OTHER)
Admission: EM | Admit: 2013-04-05 | Discharge: 2013-04-05 | Disposition: A | Discharge status: Home / Self Care | Payer: BC Managed Care – PPO | Attending: Emergency Medicine | Admitting: Emergency Medicine

## 2013-04-05 DIAGNOSIS — M25519 Pain in unspecified shoulder: Secondary | ICD-10-CM | POA: Insufficient documentation

## 2013-04-05 DIAGNOSIS — M542 Cervicalgia: Secondary | ICD-10-CM | POA: Insufficient documentation

## 2013-04-05 DIAGNOSIS — F419 Anxiety disorder, unspecified: Secondary | ICD-10-CM

## 2013-04-05 DIAGNOSIS — M47812 Spondylosis without myelopathy or radiculopathy, cervical region: Secondary | ICD-10-CM | POA: Insufficient documentation

## 2013-04-05 DIAGNOSIS — E079 Disorder of thyroid, unspecified: Secondary | ICD-10-CM | POA: Insufficient documentation

## 2013-04-05 DIAGNOSIS — Z872 Personal history of diseases of the skin and subcutaneous tissue: Secondary | ICD-10-CM | POA: Insufficient documentation

## 2013-04-05 DIAGNOSIS — Z7982 Long term (current) use of aspirin: Secondary | ICD-10-CM | POA: Insufficient documentation

## 2013-04-05 DIAGNOSIS — M791 Myalgia, unspecified site: Secondary | ICD-10-CM

## 2013-04-05 DIAGNOSIS — M5020 Other cervical disc displacement, unspecified cervical region: Secondary | ICD-10-CM | POA: Insufficient documentation

## 2013-04-05 DIAGNOSIS — Z8719 Personal history of other diseases of the digestive system: Secondary | ICD-10-CM | POA: Insufficient documentation

## 2013-04-05 DIAGNOSIS — R209 Unspecified disturbances of skin sensation: Secondary | ICD-10-CM | POA: Insufficient documentation

## 2013-04-05 DIAGNOSIS — Z79899 Other long term (current) drug therapy: Secondary | ICD-10-CM | POA: Insufficient documentation

## 2013-04-05 DIAGNOSIS — J45909 Unspecified asthma, uncomplicated: Secondary | ICD-10-CM | POA: Insufficient documentation

## 2013-04-05 DIAGNOSIS — I10 Essential (primary) hypertension: Secondary | ICD-10-CM | POA: Insufficient documentation

## 2013-04-05 DIAGNOSIS — F411 Generalized anxiety disorder: Secondary | ICD-10-CM | POA: Insufficient documentation

## 2013-04-05 DIAGNOSIS — Z8739 Personal history of other diseases of the musculoskeletal system and connective tissue: Secondary | ICD-10-CM | POA: Insufficient documentation

## 2013-04-05 DIAGNOSIS — Z880 Allergy status to penicillin: Secondary | ICD-10-CM | POA: Insufficient documentation

## 2013-04-05 DIAGNOSIS — K219 Gastro-esophageal reflux disease without esophagitis: Secondary | ICD-10-CM | POA: Insufficient documentation

## 2013-04-05 DIAGNOSIS — IMO0002 Reserved for concepts with insufficient information to code with codable children: Secondary | ICD-10-CM | POA: Insufficient documentation

## 2013-04-05 DIAGNOSIS — E119 Type 2 diabetes mellitus without complications: Secondary | ICD-10-CM | POA: Insufficient documentation

## 2013-04-05 DIAGNOSIS — Z794 Long term (current) use of insulin: Secondary | ICD-10-CM | POA: Insufficient documentation

## 2013-04-05 DIAGNOSIS — R259 Unspecified abnormal involuntary movements: Secondary | ICD-10-CM | POA: Insufficient documentation

## 2013-04-05 MED ORDER — OXYCODONE-ACETAMINOPHEN 5-325 MG PO TABS
1.0000 | ORAL_TABLET | Freq: Once | ORAL | Status: AC
  Administered 2013-04-05: 1 via ORAL
  Filled 2013-04-05 (×2): qty 1

## 2013-04-05 MED ORDER — DIPHENHYDRAMINE HCL 25 MG PO CAPS
50.0000 mg | ORAL_CAPSULE | Freq: Once | ORAL | Status: AC
  Administered 2013-04-05: 50 mg via ORAL
  Filled 2013-04-05: qty 2

## 2013-04-05 MED ORDER — DIPHENHYDRAMINE HCL 25 MG PO TABS: 25.0000 mg | ORAL_TABLET | Freq: Four times a day (QID) | ORAL | Status: DC | PRN

## 2013-04-05 NOTE — ED Notes (Signed)
Pt sts she had MRI at 0800 here, took Xanax for MRI; since taking xanax, sts she is having uncontrollable movements of muscles in body; sts she feels like her muscles in her arms need to be continuously stretched.

## 2013-04-05 NOTE — ED Provider Notes (Signed)
CSN: [id number]     Arrival date & time 04/05/13  1700 History     First MD Initiated Contact with Patient 04/05/13 1711     Chief Complaint  Patient presents with  . Anxiety   (Consider location/radiation/quality/duration/timing/severity/associated sxs/prior Treatment) HPI  52 year old female presents for evaluations of uncontrolled muscle movement. Patient reports she had an MRI of the C-spine which were performed earlier this morning. Since pt has hx of claustrophobia she took three 1mg  Lorazepam this AM, and also did not sleep the night before.  After MRI, pt became restless and report having sensation of "muscle tightening" forcing her having to stretch her arms to alleviate it.  She has had sxs like this before when taking Xanax.  She has not took Xanax in more than a year.   MRI was scheduled 6 weeks ago by her neurologist when she was complaining of neck pain.  MRI has not been read by her neurologist yet.  She is scheduled to see him Aug 21st.  Pt otherwise denies fever, chills, cp, sob, doe, cough, hemoptysis, trouble breathing or rash.    Past Medical History  Diagnosis Date  . Asthma   . Hypertension   . Diabetes mellitus   . IBS (irritable bowel syndrome)   . GERD (gastroesophageal reflux disease)   . Eczema   . Thyroid disease   . Mixed connective tissue disease    Past Surgical History  Procedure Laterality Date  . Breast surgery    . Cholecystectomy    . Abdominal hysterectomy     Family History  Problem Relation Age of Onset  . CAD Mother   . Hypertension Mother   . CAD Father    History  Substance Use Topics  . Smoking status: Never Smoker   . Smokeless tobacco: Not on file  . Alcohol Use: No   OB History   Grav Para Term Preterm Abortions TAB SAB Ect Mult Living                 Review of Systems  Constitutional: Negative for fever.  Skin: Negative for rash.  Neurological: Negative for headaches.    Allergies  Fish allergy; Gabapentin;  Peanut-containing drug products; Atrovent; Crestor; Ipratropium-albuterol; and Penicillins  Home Medications   Current Outpatient Rx  Name  Route  Sig  Dispense  Refill  . albuterol (PROVENTIL HFA;VENTOLIN HFA) 108 (90 BASE) MCG/ACT inhaler   Inhalation   Inhale 2 puffs into the lungs every 4 (four) hours as needed for wheezing or shortness of breath.   1 Inhaler   1   . aspirin EC 81 MG tablet   Oral   Take 81 mg by mouth daily.         . Cholecalciferol (VITAMIN D3) 10000 UNITS capsule   Oral   Take 10,000 Units by mouth daily.         . enalapril (VASOTEC) 10 MG tablet   Oral   Take 10 mg by mouth 2 (two) times daily.         . Estrogens, Conjugated (PREMARIN VA)   Vaginal   Place 1 application vaginally 3 (three) times a week. On Saturdays, Mondays, and Wednesday (cream)         . fentaNYL (DURAGESIC - DOSED MCG/HR) 100 MCG/HR   Transdermal   Place 1 patch onto the skin every 3 (three) days.           . fluticasone (FLONASE) 50 MCG/ACT nasal spray  Nasal   Place 2 sprays into the nose 2 (two) times daily.           Marland Kitchen gabapentin (NEURONTIN) 100 MG capsule   Oral   Take 600 mg by mouth 2 (two) times daily.         . Guaifenesin (MUCINEX MAXIMUM STRENGTH) 1200 MG TB12   Oral   Take 1,200 mg by mouth 2 (two) times daily.         . insulin regular human CONCENTRATED (HUMULIN R) 500 UNIT/ML SOLN injection   Subcutaneous   Inject 10-40 Units into the skin 3 (three) times daily with meals. Based on sliding scale         . levalbuterol (XOPENEX) 1.25 MG/0.5ML nebulizer solution   Nebulization   Take 1 ampule by nebulization 4 (four) times daily as needed for wheezing or shortness of breath.         . levothyroxine (SYNTHROID, LEVOTHROID) 200 MCG tablet   Oral   Take 200 mcg by mouth daily. Take with 2 25 mcg tablets for a 250 mcg dose         . levothyroxine (SYNTHROID, LEVOTHROID) 25 MCG tablet   Oral   Take 50 mcg by mouth daily. Take with  200 mcg tablet for a 250 mcg dose         . metFORMIN (GLUCOPHAGE) 1000 MG tablet   Oral   Take 1,000 mg by mouth 2 (two) times daily with a meal.           . metoCLOPramide (REGLAN) 10 MG tablet   Oral   Take 10 mg by mouth 2 (two) times daily.           . Multiple Vitamin (MULTIVITAMIN WITH MINERALS) TABS   Oral   Take 1 tablet by mouth daily.         . niacin (NIASPAN) 500 MG CR tablet   Oral   Take 1,000 mg by mouth at bedtime.           Marland Kitchen omeprazole (PRILOSEC) 40 MG capsule   Oral   Take 40 mg by mouth daily.         . Oxycodone HCl 10 MG TABS   Oral   Take 10 mg by mouth every 4 (four) hours as needed (for pain).         . rosuvastatin (CRESTOR) 40 MG tablet   Oral   Take 1 tablet (40 mg total) by mouth daily.   30 tablet   2   . sucralfate (CARAFATE) 1 G tablet   Oral   Take 1 g by mouth 2 (two) times daily.           . vitamin B-12 (CYANOCOBALAMIN) 1000 MCG tablet   Oral   Take 1,000 mcg by mouth daily.            BP 117/65  Pulse 97  Temp(Src) 97.9 F (36.6 C) (Oral)  Ht 5\' 2"  (1.575 m)  Wt 275 lb (124.739 kg)  BMI 50.29 kg/m2  SpO2 97% Physical Exam  Nursing note and vitals reviewed. Constitutional: She is oriented to person, place, and time. She appears well-developed and well-nourished. No distress.  HENT:  Head: Atraumatic.  Eyes: Conjunctivae are normal.  Neck: Neck supple.  Cardiovascular: Intact distal pulses.   Musculoskeletal: Normal range of motion.  Neurological: She is alert and oriented to person, place, and time. She has normal reflexes.  Skin: Skin is warm. No rash noted.  Psychiatric: She has a normal mood and affect.    ED Course   Procedures (including critical care time)  5:29 PM Pt with sensation of muscle tightening after taking Xanax this AM.  No red flags, no allergic rxn concerning for airway obstruction.  Will give benadryl.  Pt also report having neck/back pain, chronic, needing her regular pain  meds.  Pain medication given.  Care discussed with attending.   6:42 PM She felt much better after receiving Benadryl. She is comfortable and ready to be discharged. Discharge with Benadryl. Return precautions discussed. Patient will also follow up with neurologist as previously schedule.  Labs Reviewed - No data to display Mr Cervical Spine Wo Contrast  04/05/2013   *RADIOLOGY REPORT*  Clinical Data: Neck pain for the last year.  Radiation to the right shoulder.  Numbness and tingling in both hands.  MRI CERVICAL SPINE WITHOUT CONTRAST  Technique:  Multiplanar and multiecho pulse sequences of the cervical spine, to include the craniocervical junction and cervicothoracic junction, were obtained according to standard protocol without intravenous contrast.  Comparison: None.  Findings: The study is significantly degraded by patient motion despite the best efforts of the technologist.  The foramen magnum is widely patent.  C1-2 is unremarkable.  C2-3:  Mild facet degeneration without slippage or stenosis.  No disc pathology.  C3-4:  Spondylosis with bilateral uncovertebral prominence.  No central canal stenosis.  No significant foraminal stenosis suspected.  C4-5:  Mild bulging of the disc.  Mild facet degeneration on the left.  No compressive stenosis.  C5-6:  Spondylosis with endplate osteophytes and broad-based disc herniation.  Effacement ventral subarachnoid space with some flattening of the cord.  Neural foraminal encroachment bilaterally. Consideration of decompression at this level is suggested.  C6-7:  Spondylosis more pronounced towards the left.  No cord compression.  Foraminal narrowing on the left could effect the C7 nerve root.  C7-T1:  Mild bulging of the disc and facet degeneration.  No neural compression.  IMPRESSION: The study is markedly degraded by motion, despite the best efforts of the technologist.  C5-6:  Spondylosis and broad-based disc herniation.  Effacement of the subarachnoid space  with some deformity of the cord.  Foraminal encroachment bilaterally.  Consideration of decompression suggested.  C6-7:  Spondylosis more pronounced on the left with foraminal narrowing on the left that could effect the C7 nerve root.   Original Report Authenticated By: Paulina Fusi, M.D.   1. Muscle discomfort   2. Anxiety     MDM  BP 117/65  Pulse 97  Temp(Src) 97.9 F (36.6 C) (Oral)  Ht 5\' 2"  (1.575 m)  Wt 275 lb (124.739 kg)  BMI 50.29 kg/m2  SpO2 97%  I have reviewed nursing notes and vital signs. I personally reviewed the imaging tests through PACS system  I reviewed available ER/hospitalization records thought the EMR   Fayrene Helper, [place] 04/05/13 1845

## 2013-04-05 NOTE — ED Provider Notes (Signed)
Medical screening examination/treatment/procedure(s) were performed by non-physician practitioner and as supervising physician I was immediately available for consultation/collaboration.   Javel Hersh, MD 04/05/13 2345 

## 2013-06-02 ENCOUNTER — Ambulatory Visit: Payer: BC Managed Care – PPO | Attending: Specialist | Admitting: Physical Therapy

## 2013-06-02 DIAGNOSIS — IMO0001 Reserved for inherently not codable concepts without codable children: Secondary | ICD-10-CM | POA: Insufficient documentation

## 2013-06-02 DIAGNOSIS — M545 Low back pain, unspecified: Secondary | ICD-10-CM | POA: Insufficient documentation

## 2013-06-02 DIAGNOSIS — M6281 Muscle weakness (generalized): Secondary | ICD-10-CM | POA: Insufficient documentation

## 2013-06-02 DIAGNOSIS — M542 Cervicalgia: Secondary | ICD-10-CM | POA: Insufficient documentation

## 2013-06-02 DIAGNOSIS — R262 Difficulty in walking, not elsewhere classified: Secondary | ICD-10-CM | POA: Insufficient documentation

## 2013-06-04 ENCOUNTER — Ambulatory Visit: Payer: BC Managed Care – PPO | Admitting: Physical Therapy

## 2013-06-09 ENCOUNTER — Encounter: Payer: BC Managed Care – PPO | Admitting: Physical Therapy

## 2013-06-11 ENCOUNTER — Ambulatory Visit: Payer: BC Managed Care – PPO | Attending: Specialist | Admitting: Physical Therapy

## 2013-06-11 DIAGNOSIS — M542 Cervicalgia: Secondary | ICD-10-CM | POA: Insufficient documentation

## 2013-06-11 DIAGNOSIS — IMO0001 Reserved for inherently not codable concepts without codable children: Secondary | ICD-10-CM | POA: Insufficient documentation

## 2013-06-11 DIAGNOSIS — M545 Low back pain, unspecified: Secondary | ICD-10-CM | POA: Insufficient documentation

## 2013-06-11 DIAGNOSIS — R262 Difficulty in walking, not elsewhere classified: Secondary | ICD-10-CM | POA: Insufficient documentation

## 2013-06-11 DIAGNOSIS — M6281 Muscle weakness (generalized): Secondary | ICD-10-CM | POA: Insufficient documentation

## 2013-06-16 ENCOUNTER — Ambulatory Visit: Payer: BC Managed Care – PPO | Admitting: Physical Therapy

## 2013-06-18 ENCOUNTER — Encounter: Payer: BC Managed Care – PPO | Admitting: Physical Therapy

## 2013-06-19 ENCOUNTER — Ambulatory Visit: Payer: BC Managed Care – PPO | Admitting: Physical Therapy

## 2013-06-23 ENCOUNTER — Ambulatory Visit: Payer: BC Managed Care – PPO | Admitting: Physical Therapy

## 2013-06-25 ENCOUNTER — Ambulatory Visit: Payer: BC Managed Care – PPO | Admitting: Physical Therapy

## 2013-06-30 ENCOUNTER — Ambulatory Visit: Payer: BC Managed Care – PPO | Admitting: Physical Therapy

## 2013-07-02 ENCOUNTER — Ambulatory Visit: Payer: BC Managed Care – PPO | Admitting: Physical Therapy

## 2013-07-07 ENCOUNTER — Ambulatory Visit: Payer: BC Managed Care – PPO | Admitting: Physical Therapy

## 2013-07-09 ENCOUNTER — Ambulatory Visit: Payer: BC Managed Care – PPO | Admitting: Physical Therapy

## 2013-07-14 ENCOUNTER — Encounter: Payer: BC Managed Care – PPO | Admitting: Physical Therapy

## 2013-07-15 ENCOUNTER — Ambulatory Visit: Payer: BC Managed Care – PPO | Attending: Specialist | Admitting: Physical Therapy

## 2013-07-15 DIAGNOSIS — M545 Low back pain, unspecified: Secondary | ICD-10-CM | POA: Insufficient documentation

## 2013-07-15 DIAGNOSIS — M542 Cervicalgia: Secondary | ICD-10-CM | POA: Insufficient documentation

## 2013-07-15 DIAGNOSIS — R262 Difficulty in walking, not elsewhere classified: Secondary | ICD-10-CM | POA: Insufficient documentation

## 2013-07-15 DIAGNOSIS — M6281 Muscle weakness (generalized): Secondary | ICD-10-CM | POA: Insufficient documentation

## 2013-07-15 DIAGNOSIS — IMO0001 Reserved for inherently not codable concepts without codable children: Secondary | ICD-10-CM | POA: Insufficient documentation

## 2013-07-16 ENCOUNTER — Encounter: Payer: BC Managed Care – PPO | Admitting: Physical Therapy

## 2013-07-17 ENCOUNTER — Other Ambulatory Visit: Payer: Self-pay

## 2013-07-21 ENCOUNTER — Ambulatory Visit: Payer: BC Managed Care – PPO | Admitting: Physical Therapy

## 2013-07-23 ENCOUNTER — Ambulatory Visit: Payer: BC Managed Care – PPO | Admitting: Physical Therapy

## 2013-07-28 ENCOUNTER — Ambulatory Visit: Payer: BC Managed Care – PPO | Admitting: Physical Therapy

## 2013-07-30 ENCOUNTER — Ambulatory Visit: Payer: BC Managed Care – PPO | Admitting: Physical Therapy

## 2013-08-01 ENCOUNTER — Encounter (HOSPITAL_BASED_OUTPATIENT_CLINIC_OR_DEPARTMENT_OTHER): Payer: Self-pay | Admitting: Emergency Medicine

## 2013-08-01 ENCOUNTER — Emergency Department (HOSPITAL_BASED_OUTPATIENT_CLINIC_OR_DEPARTMENT_OTHER)
Admission: EM | Admit: 2013-08-01 | Discharge: 2013-08-01 | Disposition: A | Discharge status: Home / Self Care | Payer: BC Managed Care – PPO | Attending: Emergency Medicine | Admitting: Emergency Medicine

## 2013-08-01 ENCOUNTER — Ambulatory Visit: Payer: BC Managed Care – PPO | Admitting: Physical Therapy

## 2013-08-01 DIAGNOSIS — Z7982 Long term (current) use of aspirin: Secondary | ICD-10-CM | POA: Insufficient documentation

## 2013-08-01 DIAGNOSIS — J45909 Unspecified asthma, uncomplicated: Secondary | ICD-10-CM | POA: Insufficient documentation

## 2013-08-01 DIAGNOSIS — R51 Headache: Secondary | ICD-10-CM | POA: Insufficient documentation

## 2013-08-01 DIAGNOSIS — Z79899 Other long term (current) drug therapy: Secondary | ICD-10-CM | POA: Insufficient documentation

## 2013-08-01 DIAGNOSIS — R000 Tachycardia, unspecified: Secondary | ICD-10-CM | POA: Insufficient documentation

## 2013-08-01 DIAGNOSIS — I10 Essential (primary) hypertension: Secondary | ICD-10-CM | POA: Insufficient documentation

## 2013-08-01 DIAGNOSIS — R109 Unspecified abdominal pain: Secondary | ICD-10-CM | POA: Insufficient documentation

## 2013-08-01 DIAGNOSIS — IMO0002 Reserved for concepts with insufficient information to code with codable children: Secondary | ICD-10-CM | POA: Insufficient documentation

## 2013-08-01 DIAGNOSIS — Z872 Personal history of diseases of the skin and subcutaneous tissue: Secondary | ICD-10-CM | POA: Insufficient documentation

## 2013-08-01 DIAGNOSIS — R1115 Cyclical vomiting syndrome unrelated to migraine: Secondary | ICD-10-CM | POA: Insufficient documentation

## 2013-08-01 DIAGNOSIS — K219 Gastro-esophageal reflux disease without esophagitis: Secondary | ICD-10-CM | POA: Insufficient documentation

## 2013-08-01 DIAGNOSIS — Z8739 Personal history of other diseases of the musculoskeletal system and connective tissue: Secondary | ICD-10-CM | POA: Insufficient documentation

## 2013-08-01 DIAGNOSIS — E119 Type 2 diabetes mellitus without complications: Secondary | ICD-10-CM | POA: Insufficient documentation

## 2013-08-01 DIAGNOSIS — Z880 Allergy status to penicillin: Secondary | ICD-10-CM | POA: Insufficient documentation

## 2013-08-01 DIAGNOSIS — E079 Disorder of thyroid, unspecified: Secondary | ICD-10-CM | POA: Insufficient documentation

## 2013-08-01 DIAGNOSIS — R231 Pallor: Secondary | ICD-10-CM | POA: Insufficient documentation

## 2013-08-01 DIAGNOSIS — Z794 Long term (current) use of insulin: Secondary | ICD-10-CM | POA: Insufficient documentation

## 2013-08-01 LAB — CBC WITH DIFFERENTIAL/PLATELET
Basophils Absolute: 0.1 10*3/uL (ref 0.0–0.1)
Basophils Relative: 1 % (ref 0–1)
Eosinophils Absolute: 0.2 10*3/uL (ref 0.0–0.7)
Eosinophils Relative: 2 % (ref 0–5)
HCT: 47.4 % — ABNORMAL HIGH (ref 36.0–46.0)
Hemoglobin: 14.9 g/dL (ref 12.0–15.0)
Lymphocytes Relative: 24 % (ref 12–46)
Lymphs Abs: 2.6 10*3/uL (ref 0.7–4.0)
MCH: 27.2 pg (ref 26.0–34.0)
MCHC: 31.4 g/dL (ref 30.0–36.0)
MCV: 86.7 fL (ref 78.0–100.0)
Monocytes Absolute: 0.7 10*3/uL (ref 0.1–1.0)
Monocytes Relative: 6 % (ref 3–12)
Neutro Abs: 7.2 10*3/uL (ref 1.7–7.7)
Neutrophils Relative %: 67 % (ref 43–77)
Platelets: 212 10*3/uL (ref 150–400)
RBC: 5.47 MIL/uL — ABNORMAL HIGH (ref 3.87–5.11)
RDW: 14.3 % (ref 11.5–15.5)
WBC: 10.7 10*3/uL — ABNORMAL HIGH (ref 4.0–10.5)

## 2013-08-01 LAB — URINALYSIS, ROUTINE W REFLEX MICROSCOPIC
Bilirubin Urine: NEGATIVE
Glucose, UA: NEGATIVE mg/dL
Hgb urine dipstick: NEGATIVE
Ketones, ur: NEGATIVE mg/dL
Leukocytes, UA: NEGATIVE
Nitrite: NEGATIVE
Protein, ur: 30 mg/dL — AB
Specific Gravity, Urine: 1.025 (ref 1.005–1.030)
Urobilinogen, UA: 1 mg/dL (ref 0.0–1.0)
pH: 7 (ref 5.0–8.0)

## 2013-08-01 LAB — COMPREHENSIVE METABOLIC PANEL
ALT: 41 U/L — ABNORMAL HIGH (ref 0–35)
AST: 25 U/L (ref 0–37)
Albumin: 4.1 g/dL (ref 3.5–5.2)
Alkaline Phosphatase: 72 U/L (ref 39–117)
BUN: 13 mg/dL (ref 6–23)
CO2: 31 mEq/L (ref 19–32)
Calcium: 10 mg/dL (ref 8.4–10.5)
Chloride: 93 mEq/L — ABNORMAL LOW (ref 96–112)
Creatinine, Ser: 0.8 mg/dL (ref 0.50–1.10)
GFR calc Af Amer: >90 mL/min (ref >90)
GFR calc non Af Amer: 83 mL/min — ABNORMAL LOW (ref >90)
Glucose, Bld: 185 mg/dL — ABNORMAL HIGH (ref 70–99)
Potassium: 4.3 mEq/L (ref 3.5–5.1)
Sodium: 137 mEq/L (ref 135–145)
Total Bilirubin: 0.5 mg/dL (ref 0.3–1.2)
Total Protein: 8 g/dL (ref 6.0–8.3)

## 2013-08-01 LAB — URINE MICROSCOPIC-ADD ON

## 2013-08-01 LAB — LIPASE, BLOOD: Lipase: 48 U/L (ref 11–59)

## 2013-08-01 LAB — GLUCOSE, CAPILLARY: Glucose-Capillary: 174 mg/dL — ABNORMAL HIGH (ref 70–99)

## 2013-08-01 MED ORDER — SODIUM CHLORIDE 0.9 % IV SOLN
Freq: Once | INTRAVENOUS | Status: AC
  Administered 2013-08-01: 18:00:00 via INTRAVENOUS

## 2013-08-01 MED ORDER — MORPHINE SULFATE 4 MG/ML IJ SOLN
4.0000 mg | Freq: Once | INTRAMUSCULAR | Status: AC
  Administered 2013-08-01: 4 mg via INTRAVENOUS
  Filled 2013-08-01: qty 1

## 2013-08-01 MED ORDER — LORAZEPAM 1 MG PO TABS: 1.0000 mg | ORAL_TABLET | ORAL | Status: DC | PRN

## 2013-08-01 MED ORDER — ONDANSETRON HCL 4 MG/2ML IJ SOLN
4.0000 mg | Freq: Once | INTRAMUSCULAR | Status: AC
  Administered 2013-08-01: 4 mg via INTRAVENOUS
  Filled 2013-08-01: qty 2

## 2013-08-01 MED ORDER — ONDANSETRON 4 MG PO TBDP: ORAL_TABLET | ORAL | Status: DC

## 2013-08-01 MED ORDER — LORAZEPAM 2 MG/ML IJ SOLN
1.0000 mg | Freq: Once | INTRAMUSCULAR | Status: AC
  Administered 2013-08-01: 1 mg via INTRAVENOUS
  Filled 2013-08-01: qty 1

## 2013-08-01 NOTE — ED Notes (Signed)
Vomiting x 24 hours. Pain in her joints.

## 2013-08-01 NOTE — ED Provider Notes (Signed)
CSN: [id number]     Arrival date & time 08/01/13  1656 History   First MD Initiated Contact with Patient 08/01/13 1700     Chief Complaint  Patient presents with  . Emesis   (Consider location/radiation/quality/duration/timing/severity/associated sxs/prior Treatment) HPI  [name] Is a 52 year old female with a past medical history of diabetes, hypertension and, and mixed connective tissue disorder.  Patient is insulin-dependent and on Humulin N 500.  The patient is also on Plaquenil for her mixed connective tissue disorder which is followed at [facility].  Patient states that yesterday at approximately 11 PM she began experiencing nausea and upper abdominal cramping.  She has had innumerable episodes of vomiting.  States she has vomited approximately every 15 minutes.  Patient shared a meal with her husband prior to getting sick has no symptoms.  Patient also complains of severe headache.  She denies photophobia or neck stiffness.  The patient denies any rashes.  She denies any symptoms of URI, or urinary symptoms.  The patient states she has had a "flareup" of her pain lately.  She is not on prednisone at this time.  She states that this led up to her vomiting episode.  She had one previous episode similar to her presentation today at which time she was admitted for systemic inflammatory reaction.  She said they never found a source of her "sepsis."  Patient took her blood sugar at home it was 187 at that time. No hx of gastroparesis.  Past Medical History  Diagnosis Date  . Asthma   . Hypertension   . Diabetes mellitus   . IBS (irritable bowel syndrome)   . GERD (gastroesophageal reflux disease)   . Eczema   . Thyroid disease   . Mixed connective tissue disease    Past Surgical History  Procedure Laterality Date  . Breast surgery    . Cholecystectomy    . Abdominal hysterectomy     Family History  Problem Relation Age of Onset  . CAD Mother   . Hypertension Mother   .  CAD Father    History  Substance Use Topics  . Smoking status: Never Smoker   . Smokeless tobacco: Not on file  . Alcohol Use: No   OB History   Grav Para Term Preterm Abortions TAB SAB Ect Mult Living                 Review of Systems  Ten systems reviewed and are negative for acute change, except as noted in the HPI.   Allergies  Fish allergy; Gabapentin; Peanut-containing drug products; Atrovent; Crestor; Ipratropium-albuterol; and Penicillins  Home Medications   Current Outpatient Rx  Name  Route  Sig  Dispense  Refill  . albuterol (PROVENTIL HFA;VENTOLIN HFA) 108 (90 BASE) MCG/ACT inhaler   Inhalation   Inhale 2 puffs into the lungs every 4 (four) hours as needed for wheezing or shortness of breath.   1 Inhaler   1   . aspirin EC 81 MG tablet   Oral   Take 81 mg by mouth daily.         . Cholecalciferol (VITAMIN D3) 10000 UNITS capsule   Oral   Take 10,000 Units by mouth daily.         . diphenhydrAMINE (BENADRYL) 25 MG tablet   Oral   Take 1 tablet (25 mg total) by mouth every 6 (six) hours as needed for itching or allergies.   10 tablet   0   .  enalapril (VASOTEC) 10 MG tablet   Oral   Take 10 mg by mouth 2 (two) times daily.         . Estrogens, Conjugated (PREMARIN VA)   Vaginal   Place 1 application vaginally 3 (three) times a week. On Saturdays, Mondays, and Wednesday (cream)         . fentaNYL (DURAGESIC - DOSED MCG/HR) 100 MCG/HR   Transdermal   Place 1 patch onto the skin every 3 (three) days.           . fluticasone (FLONASE) 50 MCG/ACT nasal spray   Nasal   Place 2 sprays into the nose 2 (two) times daily.           Marland Kitchen gabapentin (NEURONTIN) 100 MG capsule   Oral   Take 600 mg by mouth 2 (two) times daily.         . Guaifenesin (MUCINEX MAXIMUM STRENGTH) 1200 MG TB12   Oral   Take 1,200 mg by mouth 2 (two) times daily.         . insulin regular human CONCENTRATED (HUMULIN R) 500 UNIT/ML SOLN injection   Subcutaneous    Inject 10-40 Units into the skin 3 (three) times daily with meals. Based on sliding scale         . levalbuterol (XOPENEX) 1.25 MG/0.5ML nebulizer solution   Nebulization   Take 1 ampule by nebulization 4 (four) times daily as needed for wheezing or shortness of breath.         . levothyroxine (SYNTHROID, LEVOTHROID) 200 MCG tablet   Oral   Take 200 mcg by mouth daily. Take with 2 25 mcg tablets for a 250 mcg dose         . levothyroxine (SYNTHROID, LEVOTHROID) 25 MCG tablet   Oral   Take 50 mcg by mouth daily. Take with 200 mcg tablet for a 250 mcg dose         . metFORMIN (GLUCOPHAGE) 1000 MG tablet   Oral   Take 1,000 mg by mouth 2 (two) times daily with a meal.           . metoCLOPramide (REGLAN) 10 MG tablet   Oral   Take 10 mg by mouth 2 (two) times daily.           . Multiple Vitamin (MULTIVITAMIN WITH MINERALS) TABS   Oral   Take 1 tablet by mouth daily.         . niacin (NIASPAN) 500 MG CR tablet   Oral   Take 1,000 mg by mouth at bedtime.           Marland Kitchen omeprazole (PRILOSEC) 40 MG capsule   Oral   Take 40 mg by mouth daily.         . Oxycodone HCl 10 MG TABS   Oral   Take 10 mg by mouth every 4 (four) hours as needed (for pain).         . rosuvastatin (CRESTOR) 40 MG tablet   Oral   Take 1 tablet (40 mg total) by mouth daily.   30 tablet   2   . sucralfate (CARAFATE) 1 G tablet   Oral   Take 1 g by mouth 2 (two) times daily.           . vitamin B-12 (CYANOCOBALAMIN) 1000 MCG tablet   Oral   Take 1,000 mcg by mouth daily.            BP 158/73  Pulse 103  Temp(Src) 98.1 F (36.7 C) (Oral)  Resp 18  Ht 5\' 2"  (1.575 m)  Wt 275 lb (124.739 kg)  BMI 50.29 kg/m2  SpO2 98% Physical Exam  Nursing note and vitals reviewed. Constitutional: She is oriented to person, place, and time.  Morbidly obese female.  Retching loudly upon arrival.  Patient is pale, she appears extremely uncomfortable and clinically dehydrated.  HENT:  Head:  Normocephalic and atraumatic.  Dry mucous membranes  Eyes: Pupils are equal, round, and reactive to light.  Small petechia over her left eyelid  Neck: Normal range of motion. Neck supple.  Cardiovascular: Normal heart sounds.   Tachycardia  Pulmonary/Chest: Effort normal and breath sounds normal. She has no wheezes.  Abdominal: Soft. There is no tenderness.  Exam limited due to body habitus  Musculoskeletal: Normal range of motion.  Neurological: She is alert and oriented to person, place, and time.  Skin: There is pallor.    ED Course  Procedures (including critical care time) Labs Review Labs Reviewed - No data to display Imaging Review No results found.  EKG Interpretation   None       MDM  No diagnosis found. 5:51 PM Filed Vitals:   08/01/13 1658  BP: 158/73  Pulse: 103  Temp: 98.1 F (36.7 C)  Resp: 18   Patient here with intractable emesis.  Risk factors for infection due to her diabetes and Ambien modulating drugs.  Will evaluate for infection, DKA.  Patient given fluids, Ativan, Zofran.  And morphine for her headache.    8:16 PM BP 127/64  Pulse 96  Temp(Src) 98.6 F (37 C) (Oral)  Resp 18  Ht 5\' 2"  (1.575 m)  Wt 275 lb (124.739 kg)  BMI 50.29 kg/m2  SpO2 94% Patient sitting upright and alert.  She appears well.  Color has returned to her face.  She she is feeling much better and drinking.  She is already held down 16 ounces of fluid by mouth.  Patient has slight proteinuria, hyperglycemia, she has a leukocytosis without left shift.  Anion gap of 13.  Normal lipase.  Patient serum lactate 2.77. Patient given 1 Liter of fluid IV. Believe the patient is stable for discharge at this time do not feel she has any overt sign of Sirs or sepsis.  Cannot find a reason that she may have an infection at this time.  Patient will be given Ativan and Zofran and discharge.  Encourage her to continue taking fluid and strong warning precautions for return if she should  get any change in symptoms or she is unable to hold down fluids at home. Patient is to follow up with her primary care physician in the next 2-3 days.  Return sooner to emergency department if needed.     Arthor Captain, PA-C 08/01/13 2024

## 2013-08-01 NOTE — ED Notes (Signed)
Pt. Up to restroom with assistance.  No vomiting while in room 9 during this visit.  Pt. In no distress and able to speak and laugh.

## 2013-08-04 LAB — CG4 I-STAT (LACTIC ACID): Lactic Acid, Venous: 2.77 mmol/L — ABNORMAL HIGH (ref 0.5–2.2)

## 2013-08-05 ENCOUNTER — Ambulatory Visit: Payer: BC Managed Care – PPO | Admitting: Physical Therapy

## 2013-08-06 NOTE — ED Provider Notes (Signed)
Medical screening examination/treatment/procedure(s) were performed by non-physician practitioner and as supervising physician I was immediately available for consultation/collaboration.  EKG Interpretation   None         Tulani Kidney J Lyriq Jarchow, MD 08/06/13 0722 

## 2013-08-11 ENCOUNTER — Ambulatory Visit: Payer: BC Managed Care – PPO | Admitting: Physical Therapy

## 2013-08-14 ENCOUNTER — Ambulatory Visit: Payer: BC Managed Care – PPO | Admitting: Physical Therapy

## 2013-08-18 ENCOUNTER — Ambulatory Visit: Payer: BC Managed Care – PPO | Admitting: Physical Therapy

## 2013-08-21 ENCOUNTER — Ambulatory Visit: Payer: BC Managed Care – PPO | Admitting: Physical Therapy

## 2014-01-20 DIAGNOSIS — E785 Hyperlipidemia, unspecified: Secondary | ICD-10-CM | POA: Insufficient documentation

## 2014-01-20 DIAGNOSIS — Z9109 Other allergy status, other than to drugs and biological substances: Secondary | ICD-10-CM | POA: Insufficient documentation

## 2014-01-20 DIAGNOSIS — M5136 Other intervertebral disc degeneration, lumbar region: Secondary | ICD-10-CM | POA: Insufficient documentation

## 2014-01-20 DIAGNOSIS — M47816 Spondylosis without myelopathy or radiculopathy, lumbar region: Secondary | ICD-10-CM | POA: Insufficient documentation

## 2014-01-20 DIAGNOSIS — M797 Fibromyalgia: Secondary | ICD-10-CM | POA: Insufficient documentation

## 2014-01-20 DIAGNOSIS — E559 Vitamin D deficiency, unspecified: Secondary | ICD-10-CM | POA: Insufficient documentation

## 2014-03-26 DIAGNOSIS — K589 Irritable bowel syndrome without diarrhea: Secondary | ICD-10-CM | POA: Insufficient documentation

## 2014-03-26 DIAGNOSIS — G894 Chronic pain syndrome: Secondary | ICD-10-CM | POA: Insufficient documentation

## 2014-03-26 DIAGNOSIS — E1142 Type 2 diabetes mellitus with diabetic polyneuropathy: Secondary | ICD-10-CM | POA: Insufficient documentation

## 2014-03-26 DIAGNOSIS — M2040 Other hammer toe(s) (acquired), unspecified foot: Secondary | ICD-10-CM | POA: Insufficient documentation

## 2014-03-26 DIAGNOSIS — K449 Diaphragmatic hernia without obstruction or gangrene: Secondary | ICD-10-CM | POA: Insufficient documentation

## 2014-03-26 DIAGNOSIS — E539 Vitamin B deficiency, unspecified: Secondary | ICD-10-CM | POA: Insufficient documentation

## 2014-03-26 DIAGNOSIS — H905 Unspecified sensorineural hearing loss: Secondary | ICD-10-CM | POA: Insufficient documentation

## 2014-03-26 DIAGNOSIS — I10 Essential (primary) hypertension: Secondary | ICD-10-CM | POA: Insufficient documentation

## 2014-03-26 DIAGNOSIS — E119 Type 2 diabetes mellitus without complications: Secondary | ICD-10-CM | POA: Insufficient documentation

## 2014-03-26 DIAGNOSIS — E039 Hypothyroidism, unspecified: Secondary | ICD-10-CM | POA: Insufficient documentation

## 2014-03-26 DIAGNOSIS — K219 Gastro-esophageal reflux disease without esophagitis: Secondary | ICD-10-CM | POA: Insufficient documentation

## 2014-03-26 DIAGNOSIS — R7989 Other specified abnormal findings of blood chemistry: Secondary | ICD-10-CM | POA: Insufficient documentation

## 2014-03-26 DIAGNOSIS — R945 Abnormal results of liver function studies: Secondary | ICD-10-CM | POA: Insufficient documentation

## 2014-03-26 DIAGNOSIS — Z87898 Personal history of other specified conditions: Secondary | ICD-10-CM | POA: Insufficient documentation

## 2014-11-16 DIAGNOSIS — N760 Acute vaginitis: Secondary | ICD-10-CM | POA: Insufficient documentation

## 2015-02-10 ENCOUNTER — Encounter (HOSPITAL_BASED_OUTPATIENT_CLINIC_OR_DEPARTMENT_OTHER): Payer: Self-pay | Admitting: Emergency Medicine

## 2015-02-10 ENCOUNTER — Inpatient Hospital Stay (HOSPITAL_BASED_OUTPATIENT_CLINIC_OR_DEPARTMENT_OTHER)
Admission: EM | Admit: 2015-02-10 | Discharge: 2015-02-13 | DRG: 372 | Disposition: A | Discharge status: Home / Self Care | Payer: BLUE CROSS/BLUE SHIELD | Attending: Internal Medicine | Admitting: Internal Medicine

## 2015-02-10 ENCOUNTER — Emergency Department (HOSPITAL_BASED_OUTPATIENT_CLINIC_OR_DEPARTMENT_OTHER): Payer: BLUE CROSS/BLUE SHIELD

## 2015-02-10 DIAGNOSIS — Z9102 Food additives allergy status: Secondary | ICD-10-CM | POA: Diagnosis not present

## 2015-02-10 DIAGNOSIS — G629 Polyneuropathy, unspecified: Secondary | ICD-10-CM | POA: Diagnosis present

## 2015-02-10 DIAGNOSIS — J45909 Unspecified asthma, uncomplicated: Secondary | ICD-10-CM | POA: Diagnosis present

## 2015-02-10 DIAGNOSIS — E039 Hypothyroidism, unspecified: Secondary | ICD-10-CM | POA: Diagnosis present

## 2015-02-10 DIAGNOSIS — R112 Nausea with vomiting, unspecified: Secondary | ICD-10-CM | POA: Diagnosis present

## 2015-02-10 DIAGNOSIS — I10 Essential (primary) hypertension: Secondary | ICD-10-CM | POA: Diagnosis present

## 2015-02-10 DIAGNOSIS — Z87891 Personal history of nicotine dependence: Secondary | ICD-10-CM | POA: Diagnosis not present

## 2015-02-10 DIAGNOSIS — M940 Chondrocostal junction syndrome [Tietze]: Secondary | ICD-10-CM | POA: Diagnosis present

## 2015-02-10 DIAGNOSIS — K3184 Gastroparesis: Secondary | ICD-10-CM | POA: Diagnosis present

## 2015-02-10 DIAGNOSIS — Z7901 Long term (current) use of anticoagulants: Secondary | ICD-10-CM

## 2015-02-10 DIAGNOSIS — I872 Venous insufficiency (chronic) (peripheral): Secondary | ICD-10-CM | POA: Diagnosis present

## 2015-02-10 DIAGNOSIS — J4520 Mild intermittent asthma, uncomplicated: Secondary | ICD-10-CM | POA: Diagnosis not present

## 2015-02-10 DIAGNOSIS — A0472 Enterocolitis due to Clostridium difficile, not specified as recurrent: Secondary | ICD-10-CM | POA: Diagnosis present

## 2015-02-10 DIAGNOSIS — Z794 Long term (current) use of insulin: Secondary | ICD-10-CM | POA: Diagnosis not present

## 2015-02-10 DIAGNOSIS — Z880 Allergy status to penicillin: Secondary | ICD-10-CM

## 2015-02-10 DIAGNOSIS — Z9049 Acquired absence of other specified parts of digestive tract: Secondary | ICD-10-CM | POA: Diagnosis present

## 2015-02-10 DIAGNOSIS — G8929 Other chronic pain: Secondary | ICD-10-CM | POA: Diagnosis present

## 2015-02-10 DIAGNOSIS — E785 Hyperlipidemia, unspecified: Secondary | ICD-10-CM | POA: Diagnosis present

## 2015-02-10 DIAGNOSIS — Z79899 Other long term (current) drug therapy: Secondary | ICD-10-CM

## 2015-02-10 DIAGNOSIS — A047 Enterocolitis due to Clostridium difficile: Secondary | ICD-10-CM | POA: Diagnosis present

## 2015-02-10 DIAGNOSIS — I509 Heart failure, unspecified: Secondary | ICD-10-CM | POA: Diagnosis not present

## 2015-02-10 DIAGNOSIS — Z90710 Acquired absence of both cervix and uterus: Secondary | ICD-10-CM | POA: Diagnosis not present

## 2015-02-10 DIAGNOSIS — Z7982 Long term (current) use of aspirin: Secondary | ICD-10-CM | POA: Diagnosis not present

## 2015-02-10 DIAGNOSIS — Z6841 Body Mass Index (BMI) 40.0 and over, adult: Secondary | ICD-10-CM | POA: Diagnosis not present

## 2015-02-10 DIAGNOSIS — Z888 Allergy status to other drugs, medicaments and biological substances status: Secondary | ICD-10-CM

## 2015-02-10 DIAGNOSIS — E1143 Type 2 diabetes mellitus with diabetic autonomic (poly)neuropathy: Secondary | ICD-10-CM | POA: Diagnosis present

## 2015-02-10 DIAGNOSIS — K219 Gastro-esophageal reflux disease without esophagitis: Secondary | ICD-10-CM | POA: Diagnosis present

## 2015-02-10 DIAGNOSIS — M351 Other overlap syndromes: Secondary | ICD-10-CM | POA: Diagnosis present

## 2015-02-10 DIAGNOSIS — E119 Type 2 diabetes mellitus without complications: Secondary | ICD-10-CM

## 2015-02-10 DIAGNOSIS — R197 Diarrhea, unspecified: Secondary | ICD-10-CM

## 2015-02-10 DIAGNOSIS — R110 Nausea: Secondary | ICD-10-CM | POA: Diagnosis not present

## 2015-02-10 LAB — COMPREHENSIVE METABOLIC PANEL
ALT: 47 U/L (ref 14–54)
AST: 47 U/L — ABNORMAL HIGH (ref 15–41)
Albumin: 4 g/dL (ref 3.5–5.0)
Alkaline Phosphatase: 63 U/L (ref 38–126)
Anion gap: 12 (ref 5–15)
BUN: <5 mg/dL — ABNORMAL LOW (ref 6–20)
CO2: 27 mmol/L (ref 22–32)
Calcium: 9.4 mg/dL (ref 8.9–10.3)
Chloride: 100 mmol/L — ABNORMAL LOW (ref 101–111)
Creatinine, Ser: 0.72 mg/dL (ref 0.44–1.00)
GFR calc Af Amer: >60 mL/min (ref >60)
GFR calc non Af Amer: >60 mL/min (ref >60)
Glucose, Bld: 222 mg/dL — ABNORMAL HIGH (ref 65–99)
Potassium: 3.7 mmol/L (ref 3.5–5.1)
Sodium: 139 mmol/L (ref 135–145)
Total Bilirubin: 0.3 mg/dL (ref 0.3–1.2)
Total Protein: 6.9 g/dL (ref 6.5–8.1)

## 2015-02-10 LAB — URINE MICROSCOPIC-ADD ON

## 2015-02-10 LAB — CBC WITH DIFFERENTIAL/PLATELET
Basophils Absolute: 0.1 10*3/uL (ref 0.0–0.1)
Basophils Relative: 1 % (ref 0–1)
Eosinophils Absolute: 0.1 10*3/uL (ref 0.0–0.7)
Eosinophils Relative: 1 % (ref 0–5)
HCT: 44.2 % (ref 36.0–46.0)
Hemoglobin: 13.9 g/dL (ref 12.0–15.0)
Lymphocytes Relative: 24 % (ref 12–46)
Lymphs Abs: 1.6 10*3/uL (ref 0.7–4.0)
MCH: 28.1 pg (ref 26.0–34.0)
MCHC: 31.4 g/dL (ref 30.0–36.0)
MCV: 89.5 fL (ref 78.0–100.0)
Monocytes Absolute: 0.6 10*3/uL (ref 0.1–1.0)
Monocytes Relative: 9 % (ref 3–12)
Neutro Abs: 4.3 10*3/uL (ref 1.7–7.7)
Neutrophils Relative %: 65 % (ref 43–77)
Platelets: 237 10*3/uL (ref 150–400)
RBC: 4.94 MIL/uL (ref 3.87–5.11)
RDW: 15.2 % (ref 11.5–15.5)
WBC: 6.6 10*3/uL (ref 4.0–10.5)

## 2015-02-10 LAB — I-STAT CG4 LACTIC ACID, ED: Lactic Acid, Venous: 3.2 mmol/L (ref 0.5–2.0)

## 2015-02-10 LAB — URINALYSIS, ROUTINE W REFLEX MICROSCOPIC
Bilirubin Urine: NEGATIVE
Glucose, UA: NEGATIVE mg/dL
Ketones, ur: 15 mg/dL — AB
Leukocytes, UA: NEGATIVE
Nitrite: NEGATIVE
Protein, ur: NEGATIVE mg/dL
Specific Gravity, Urine: 1.006 (ref 1.005–1.030)
Urobilinogen, UA: 0.2 mg/dL (ref 0.0–1.0)
pH: 8 (ref 5.0–8.0)

## 2015-02-10 LAB — TROPONIN I: Troponin I: 0.06 ng/mL — ABNORMAL HIGH (ref <0.031)

## 2015-02-10 LAB — OCCULT BLOOD X 1 CARD TO LAB, STOOL: Fecal Occult Bld: NEGATIVE

## 2015-02-10 MED ORDER — MORPHINE SULFATE 2 MG/ML IJ SOLN
2.0000 mg | Freq: Once | INTRAMUSCULAR | Status: AC
  Administered 2015-02-10: 2 mg via INTRAVENOUS
  Filled 2015-02-10: qty 1

## 2015-02-10 MED ORDER — SODIUM CHLORIDE 0.9 % IV BOLUS (SEPSIS)
1000.0000 mL | Freq: Once | INTRAVENOUS | Status: AC
  Administered 2015-02-10: 1000 mL via INTRAVENOUS

## 2015-02-10 MED ORDER — PROMETHAZINE HCL 25 MG/ML IJ SOLN
12.5000 mg | Freq: Once | INTRAMUSCULAR | Status: AC
  Administered 2015-02-10: 12.5 mg via INTRAVENOUS
  Filled 2015-02-10: qty 1

## 2015-02-10 MED ORDER — SODIUM CHLORIDE 0.9 % IV BOLUS (SEPSIS)
1000.0000 mL | Freq: Once | INTRAVENOUS | Status: AC
Start: 2015-02-10 — End: 2015-02-10
  Administered 2015-02-10: 1000 mL via INTRAVENOUS

## 2015-02-10 MED ORDER — OXYCODONE HCL 5 MG PO TABS
10.0000 mg | ORAL_TABLET | Freq: Once | ORAL | Status: AC
  Administered 2015-02-10: 10 mg via ORAL
  Filled 2015-02-10: qty 2

## 2015-02-10 NOTE — ED Notes (Signed)
xr at bedside

## 2015-02-10 NOTE — ED Provider Notes (Signed)
CSN: [id number]     Arrival date & time 02/10/15  1552 History   First MD Initiated Contact with Patient 02/10/15 1651     Chief Complaint  Patient presents with  . Emesis   HPI [name] is a 53yo woman with PMHx of HTN, Type 2 DM, asthma, hypothyroidism, and mixed connective tissue disorder who presents to the ED with emesis. Patient reports since Mother's Day (01/17/15) she has had diarrhea 2-3 times daily. She states she has had nausea since that time as well but only started vomiting yesterday. She describes having bright red blood in her stool for a few weeks. She states her emesis had small red blood streaks. She has been unable to eat anything for the past 3 days. She notes bilateral lower abdominal pain that has been occurring since the diarrhea. She reports subjective fevers at home with chills. She also notes chest pain that started a few days ago. She describes the chest pain as sharp, substernal, worse when leaning over, and radiating down her left arm. She reports dyspnea. She notes she has chronic costochondritis pain and GERD but this pain feels different. She states she was hospitalized 5 years ago for similar symptoms and was found to have sepsis. She reports she was treated last month for a "lung infection" with levaquin and prednisone.   Past Medical History  Diagnosis Date  . Asthma   . Hypertension   . Diabetes mellitus   . IBS (irritable bowel syndrome)   . GERD (gastroesophageal reflux disease)   . Eczema   . Thyroid disease   . Mixed connective tissue disease    Past Surgical History  Procedure Laterality Date  . Breast surgery    . Cholecystectomy    . Abdominal hysterectomy     Family History  Problem Relation Age of Onset  . CAD Mother   . Hypertension Mother   . CAD Father    History  Substance Use Topics  . Smoking status: Never Smoker   . Smokeless tobacco: Not on file  . Alcohol Use: No   OB History    No data available     Review of  Systems General: Denies night sweats, changes in weight HEENT: Denies headaches, ear pain, changes in vision, rhinorrhea, sore throat CV: Denies palpitations, orthopnea Pulm: Reports cough-chronic. Denies wheezing GI: See HPI GU: Denies dysuria, hematuria, frequency Msk: Reports myalgias- chronic due to her connective tissue disorder  Neuro: Denies weakness, numbness, tingling Skin: Denies rashes, bruising   Allergies  Fish allergy; Gabapentin; Peanut-containing drug products; Atrovent; Crestor; Ipratropium-albuterol; and Penicillins  Home Medications   Prior to Admission medications   Medication Sig Start Date End Date Taking? Authorizing Provider  albuterol (PROVENTIL HFA;VENTOLIN HFA) 108 (90 BASE) MCG/ACT inhaler Inhale 2 puffs into the lungs every 4 (four) hours as needed for wheezing or shortness of breath. 11/14/12   Richarda Overlie, MD  aspirin EC 81 MG tablet Take 81 mg by mouth daily.    Historical Provider, MD  Cholecalciferol (VITAMIN D3) 10000 UNITS capsule Take 10,000 Units by mouth daily.    Historical Provider, MD  diphenhydrAMINE (BENADRYL) 25 MG tablet Take 1 tablet (25 mg total) by mouth every 6 (six) hours as needed for itching or allergies. 04/05/13   Fayrene Helper, PA-C  enalapril (VASOTEC) 10 MG tablet Take 10 mg by mouth 2 (two) times daily.    Historical Provider, MD  Estrogens, Conjugated (PREMARIN VA) Place 1 application vaginally 3 (three) times a  week. On Saturdays, Mondays, and Wednesday (cream)    Historical Provider, MD  fentaNYL (DURAGESIC - DOSED MCG/HR) 100 MCG/HR Place 1 patch onto the skin every 3 (three) days.      Historical Provider, MD  fluticasone (FLONASE) 50 MCG/ACT nasal spray Place 2 sprays into the nose 2 (two) times daily.      Historical Provider, MD  gabapentin (NEURONTIN) 100 MG capsule Take 600 mg by mouth 2 (two) times daily.    Historical Provider, MD  Guaifenesin (MUCINEX MAXIMUM STRENGTH) 1200 MG TB12 Take 1,200 mg by mouth 2 (two) times  daily.    Historical Provider, MD  insulin regular human CONCENTRATED (HUMULIN R) 500 UNIT/ML SOLN injection Inject 10-40 Units into the skin 3 (three) times daily with meals. Based on sliding scale    Historical Provider, MD  levalbuterol (XOPENEX) 1.25 MG/0.5ML nebulizer solution Take 1 ampule by nebulization 4 (four) times daily as needed for wheezing or shortness of breath.    Historical Provider, MD  levothyroxine (SYNTHROID, LEVOTHROID) 200 MCG tablet Take 200 mcg by mouth daily. Take with 2 25 mcg tablets for a 250 mcg dose    Historical Provider, MD  levothyroxine (SYNTHROID, LEVOTHROID) 25 MCG tablet Take 50 mcg by mouth daily. Take with 200 mcg tablet for a 250 mcg dose    Historical Provider, MD  LORazepam (ATIVAN) 1 MG tablet Take 1 tablet (1 mg total) by mouth every 4 (four) hours as needed for anxiety. 08/01/13   Arthor Captain, PA-C  metFORMIN (GLUCOPHAGE) 1000 MG tablet Take 1,000 mg by mouth 2 (two) times daily with a meal.      Historical Provider, MD  metoCLOPramide (REGLAN) 10 MG tablet Take 10 mg by mouth 2 (two) times daily.      Historical Provider, MD  Multiple Vitamin (MULTIVITAMIN WITH MINERALS) TABS Take 1 tablet by mouth daily.    Historical Provider, MD  niacin (NIASPAN) 500 MG CR tablet Take 1,000 mg by mouth at bedtime.      Historical Provider, MD  omeprazole (PRILOSEC) 40 MG capsule Take 40 mg by mouth daily.    Historical Provider, MD  ondansetron (ZOFRAN ODT) 4 MG disintegrating tablet 4mg  ODT q4 hours prn nausea/vomit 08/01/13   08/03/13, PA-C  Oxycodone HCl 10 MG TABS Take 10 mg by mouth every 4 (four) hours as needed (for pain).    Historical Provider, MD  rosuvastatin (CRESTOR) 40 MG tablet Take 1 tablet (40 mg total) by mouth daily. 11/14/12   01/14/13, MD  sucralfate (CARAFATE) 1 G tablet Take 1 g by mouth 2 (two) times daily.      Historical Provider, MD  vitamin B-12 (CYANOCOBALAMIN) 1000 MCG tablet Take 1,000 mcg by mouth daily.      Historical  Provider, MD   BP 140/72 mmHg  Pulse 95  Temp(Src) 98.3 F (36.8 C) (Oral)  Resp 18  Ht 5\' 2"  (1.575 m)  Wt 280 lb (127.007 kg)  BMI 51.20 kg/m2  SpO2 97% Physical Exam General: middle aged obese woman sitting up in bed, facial plethora present HEENT: [place]/AT, EOMI, PERRL, pharynx non-erythematous, thrush on tongue Chest: RRR, no m/g/r. Mild tenderness to palpation of both sides of chest.  Pulm: difficult to assess breath sounds due to body habitus. No rales or wheezing appreciated. Breaths non-labored Abd: BS+, soft, obese, mild tenderness in lower abdomen Rectal: FOBT performed, no blood noted in stool. Stool yellow-brown appearing.  Ext: warm, 1+ pitting edema in lower extremities  Neuro:  alert and oriented x 3, no focal deficits   ED Course  Procedures (including critical care time) Medications  promethazine (PHENERGAN) injection 12.5 mg (12.5 mg Intravenous Given 02/10/15 1744)  morphine 2 MG/ML injection 2 mg (2 mg Intravenous Given 02/10/15 1747)  sodium chloride 0.9 % bolus 1,000 mL (1,000 mLs Intravenous New Bag/Given 02/10/15 1744)    Labs Review Labs Reviewed  I-STAT CG4 LACTIC ACID, ED - Abnormal; Notable for the following:    Lactic Acid, Venous 3.20 (*)    All other components within normal limits  CLOSTRIDIUM DIFFICILE BY PCR (NOT AT [facility])  CBC WITH DIFFERENTIAL/PLATELET  COMPREHENSIVE METABOLIC PANEL  LACTIC ACID, PLASMA  LACTIC ACID, PLASMA  TROPONIN I  URINALYSIS, ROUTINE W REFLEX MICROSCOPIC (NOT AT [facility])  OCCULT BLOOD X 1 CARD TO LAB, STOOL    Imaging Review No results found.   EKG Interpretation   Date/Time:  Wednesday February 10 2015 18:21:38 EDT Ventricular Rate:  90 PR Interval:  190 QRS Duration: 82 QT Interval:  394 QTC Calculation: 481 R Axis:   -65 Text Interpretation:  Normal sinus rhythm Left axis deviation Low voltage  QRS Inferior infarct , age undetermined Possible Anterolateral infarct ,  age undetermined Abnormal ECG Confirmed by  BEATON  MD, ROBERT ([id number]) on  02/10/2015 6:30:21 PM      MDM   Final diagnoses:  None   5:37 PM: Patient presenting with almost a month of diarrhea and 1 day of emesis after being treated with levaquin for a presumed pneumonia about 1 month ago. Will check C diff toxin and FOBT since had reported bloody stools. Doubt ACS but will get EKG and troponin to further evaluate. Will start IVF, morphine, and phenergan.   6:03 PM: Lactic acid elevated at 3.2. IVF administered. Another 1 L bolus given. Blood cultures x 2 ordered.   8:17 PM: Troponin minimally elevated at 0.06. EKG without ischemic changes.   Patient to be admitted to hospitalist service for further evaluation of her diarrhea and elevated lactic acid.   Rich Number, MD IMTS PGY-1 Pager: [phone]    Su Hoff, MD 02/10/15 3614  Nelva Nay, MD 02/10/15 2027

## 2015-02-10 NOTE — ED Notes (Signed)
Lactic Acid Critical Value reported to MD & RN.

## 2015-02-10 NOTE — ED Notes (Signed)
Pt states that she has been having n/v since mothers day but the past two days have been worse

## 2015-02-10 NOTE — ED Notes (Signed)
Pt sts the morphine helped her pain briefly but has made her restless.

## 2015-02-10 NOTE — ED Notes (Signed)
MD at bedside. 

## 2015-02-10 NOTE — ED Notes (Signed)
Call pt's spouse, [name], at [phone] when admission info is available.

## 2015-02-10 NOTE — ED Notes (Signed)
Correction to previous entry- Urine NOT collected. Specimen clicked off in error.

## 2015-02-11 ENCOUNTER — Encounter (HOSPITAL_COMMUNITY): Payer: Self-pay | Admitting: *Deleted

## 2015-02-11 ENCOUNTER — Inpatient Hospital Stay (HOSPITAL_COMMUNITY): Payer: BLUE CROSS/BLUE SHIELD

## 2015-02-11 DIAGNOSIS — R112 Nausea with vomiting, unspecified: Secondary | ICD-10-CM

## 2015-02-11 DIAGNOSIS — R600 Localized edema: Secondary | ICD-10-CM

## 2015-02-11 DIAGNOSIS — R197 Diarrhea, unspecified: Secondary | ICD-10-CM

## 2015-02-11 DIAGNOSIS — I509 Heart failure, unspecified: Secondary | ICD-10-CM

## 2015-02-11 DIAGNOSIS — E119 Type 2 diabetes mellitus without complications: Secondary | ICD-10-CM

## 2015-02-11 DIAGNOSIS — I872 Venous insufficiency (chronic) (peripheral): Secondary | ICD-10-CM | POA: Diagnosis present

## 2015-02-11 DIAGNOSIS — J4520 Mild intermittent asthma, uncomplicated: Secondary | ICD-10-CM

## 2015-02-11 DIAGNOSIS — I10 Essential (primary) hypertension: Secondary | ICD-10-CM

## 2015-02-11 LAB — LIPID PANEL
Cholesterol: 191 mg/dL (ref 0–200)
HDL: 37 mg/dL — ABNORMAL LOW (ref >40)
LDL Cholesterol: 126 mg/dL — ABNORMAL HIGH (ref 0–99)
Total CHOL/HDL Ratio: 5.2 RATIO
Triglycerides: 140 mg/dL (ref <150)
VLDL: 28 mg/dL (ref 0–40)

## 2015-02-11 LAB — CBC
HCT: 41 % (ref 36.0–46.0)
Hemoglobin: 12.9 g/dL (ref 12.0–15.0)
MCH: 28.4 pg (ref 26.0–34.0)
MCHC: 31.5 g/dL (ref 30.0–36.0)
MCV: 90.1 fL (ref 78.0–100.0)
Platelets: 216 10*3/uL (ref 150–400)
RBC: 4.55 MIL/uL (ref 3.87–5.11)
RDW: 15.4 % (ref 11.5–15.5)
WBC: 6.4 10*3/uL (ref 4.0–10.5)

## 2015-02-11 LAB — TROPONIN I
Troponin I: 0.04 ng/mL — ABNORMAL HIGH (ref <0.031)
Troponin I: <0.03 ng/mL (ref <0.031)
Troponin I: <0.03 ng/mL (ref <0.031)

## 2015-02-11 LAB — LACTIC ACID, PLASMA
Lactic Acid, Venous: 1.8 mmol/L (ref 0.5–2.0)
Lactic Acid, Venous: 1.3 mmol/L (ref 0.5–2.0)

## 2015-02-11 LAB — COMPREHENSIVE METABOLIC PANEL
ALT: 40 U/L (ref 14–54)
AST: 38 U/L (ref 15–41)
Albumin: 3.5 g/dL (ref 3.5–5.0)
Alkaline Phosphatase: 52 U/L (ref 38–126)
Anion gap: 12 (ref 5–15)
BUN: <5 mg/dL — ABNORMAL LOW (ref 6–20)
CO2: 26 mmol/L (ref 22–32)
Calcium: 8.8 mg/dL — ABNORMAL LOW (ref 8.9–10.3)
Chloride: 103 mmol/L (ref 101–111)
Creatinine, Ser: 0.82 mg/dL (ref 0.44–1.00)
GFR calc Af Amer: >60 mL/min (ref >60)
GFR calc non Af Amer: >60 mL/min (ref >60)
Glucose, Bld: 159 mg/dL — ABNORMAL HIGH (ref 65–99)
Potassium: 3.4 mmol/L — ABNORMAL LOW (ref 3.5–5.1)
Sodium: 141 mmol/L (ref 135–145)
Total Bilirubin: 0.5 mg/dL (ref 0.3–1.2)
Total Protein: 6.6 g/dL (ref 6.5–8.1)

## 2015-02-11 LAB — GLUCOSE, CAPILLARY
Glucose-Capillary: 158 mg/dL — ABNORMAL HIGH (ref 65–99)
Glucose-Capillary: 151 mg/dL — ABNORMAL HIGH (ref 65–99)
Glucose-Capillary: 184 mg/dL — ABNORMAL HIGH (ref 65–99)
Glucose-Capillary: 164 mg/dL — ABNORMAL HIGH (ref 65–99)

## 2015-02-11 LAB — BRAIN NATRIURETIC PEPTIDE: B Natriuretic Peptide: 62.6 pg/mL (ref 0.0–100.0)

## 2015-02-11 LAB — PROTIME-INR
INR: 1.13 (ref 0.00–1.49)
Prothrombin Time: 14.7 seconds (ref 11.6–15.2)

## 2015-02-11 LAB — PROCALCITONIN: Procalcitonin: <0.1 ng/mL

## 2015-02-11 LAB — APTT: aPTT: 28 seconds (ref 24–37)

## 2015-02-11 LAB — CLOSTRIDIUM DIFFICILE BY PCR: Toxigenic C. Difficile by PCR: POSITIVE — AB

## 2015-02-11 MED ORDER — FENTANYL 100 MCG/HR TD PT72
100.0000 ug | MEDICATED_PATCH | TRANSDERMAL | Status: DC
  Administered 2015-02-11 – 2015-02-13 (×2): 100 ug via TRANSDERMAL
  Filled 2015-02-11 (×2): qty 1

## 2015-02-11 MED ORDER — VITAMIN D3 250 MCG (10000 UT) PO CAPS: 10000.0000 [IU] | ORAL_CAPSULE | Freq: Every day | ORAL | Status: DC

## 2015-02-11 MED ORDER — GUAIFENESIN ER 1200 MG PO TB12: 1200.0000 mg | ORAL_TABLET | Freq: Two times a day (BID) | ORAL | Status: DC

## 2015-02-11 MED ORDER — HYDROXYZINE HCL 50 MG/ML IM SOLN
25.0000 mg | Freq: Four times a day (QID) | INTRAMUSCULAR | Status: DC | PRN
  Filled 2015-02-11 (×2): qty 0.5

## 2015-02-11 MED ORDER — HYDROXYZINE HCL 50 MG/ML IM SOLN: 25.0000 mg | Freq: Four times a day (QID) | INTRAMUSCULAR | Status: DC | PRN

## 2015-02-11 MED ORDER — INSULIN ASPART 100 UNIT/ML ~~LOC~~ SOLN
0.0000 [IU] | Freq: Three times a day (TID) | SUBCUTANEOUS | Status: DC
  Administered 2015-02-11 (×3): 3 [IU] via SUBCUTANEOUS
  Administered 2015-02-12 – 2015-02-13 (×4): 2 [IU] via SUBCUTANEOUS

## 2015-02-11 MED ORDER — VITAMIN D3 25 MCG (1000 UNIT) PO TABS
1000.0000 [IU] | ORAL_TABLET | Freq: Every day | ORAL | Status: DC
  Administered 2015-02-11 – 2015-02-12 (×2): 1000 [IU] via ORAL
  Filled 2015-02-11 (×3): qty 1

## 2015-02-11 MED ORDER — NIACIN ER (ANTIHYPERLIPIDEMIC) 500 MG PO TBCR
1000.0000 mg | EXTENDED_RELEASE_TABLET | Freq: Every day | ORAL | Status: DC
  Administered 2015-02-11 – 2015-02-12 (×2): 1000 mg via ORAL
  Filled 2015-02-11 (×3): qty 2

## 2015-02-11 MED ORDER — HYDROMORPHONE HCL 1 MG/ML IJ SOLN
1.0000 mg | INTRAMUSCULAR | Status: DC | PRN
  Administered 2015-02-11 – 2015-02-13 (×7): 1 mg via INTRAVENOUS
  Filled 2015-02-11 (×7): qty 1

## 2015-02-11 MED ORDER — ADULT MULTIVITAMIN W/MINERALS CH
1.0000 | ORAL_TABLET | Freq: Every day | ORAL | Status: DC
  Administered 2015-02-11 – 2015-02-12 (×2): 1 via ORAL
  Filled 2015-02-11 (×3): qty 1

## 2015-02-11 MED ORDER — ONDANSETRON HCL 4 MG/2ML IJ SOLN
4.0000 mg | Freq: Four times a day (QID) | INTRAMUSCULAR | Status: DC | PRN
  Administered 2015-02-11: 4 mg via INTRAVENOUS
  Filled 2015-02-11 (×2): qty 2

## 2015-02-11 MED ORDER — ONDANSETRON HCL 4 MG/2ML IJ SOLN: 4.0000 mg | Freq: Three times a day (TID) | INTRAMUSCULAR | Status: DC | PRN

## 2015-02-11 MED ORDER — ALBUTEROL SULFATE (2.5 MG/3ML) 0.083% IN NEBU
2.5000 mg | INHALATION_SOLUTION | RESPIRATORY_TRACT | Status: DC | PRN
  Administered 2015-02-12: 2.5 mg via RESPIRATORY_TRACT
  Filled 2015-02-11: qty 3

## 2015-02-11 MED ORDER — ENALAPRIL MALEATE 10 MG PO TABS
10.0000 mg | ORAL_TABLET | Freq: Two times a day (BID) | ORAL | Status: DC
  Administered 2015-02-11 – 2015-02-12 (×4): 10 mg via ORAL
  Filled 2015-02-11 (×6): qty 1

## 2015-02-11 MED ORDER — VITAMIN B-12 1000 MCG PO TABS
1000.0000 ug | ORAL_TABLET | Freq: Every day | ORAL | Status: DC
  Administered 2015-02-11 – 2015-02-12 (×2): 1000 ug via ORAL
  Filled 2015-02-11 (×3): qty 1

## 2015-02-11 MED ORDER — HYDROXYCHLOROQUINE SULFATE 200 MG PO TABS
200.0000 mg | ORAL_TABLET | Freq: Two times a day (BID) | ORAL | Status: DC
  Administered 2015-02-11 – 2015-02-12 (×4): 200 mg via ORAL
  Filled 2015-02-11 (×6): qty 1

## 2015-02-11 MED ORDER — SODIUM CHLORIDE 0.9 % IV BOLUS (SEPSIS)
1000.0000 mL | Freq: Once | INTRAVENOUS | Status: AC
  Administered 2015-02-11: 1000 mL via INTRAVENOUS

## 2015-02-11 MED ORDER — POTASSIUM CHLORIDE 10 MEQ/100ML IV SOLN
10.0000 meq | INTRAVENOUS | Status: AC
  Administered 2015-02-11 (×4): 10 meq via INTRAVENOUS
  Filled 2015-02-11 (×5): qty 100

## 2015-02-11 MED ORDER — GABAPENTIN 300 MG PO CAPS
600.0000 mg | ORAL_CAPSULE | Freq: Two times a day (BID) | ORAL | Status: DC
  Administered 2015-02-11 – 2015-02-12 (×4): 600 mg via ORAL
  Filled 2015-02-11 (×6): qty 2

## 2015-02-11 MED ORDER — GUAIFENESIN ER 600 MG PO TB12
1200.0000 mg | ORAL_TABLET | Freq: Two times a day (BID) | ORAL | Status: DC
  Administered 2015-02-11 – 2015-02-12 (×4): 1200 mg via ORAL
  Filled 2015-02-11 (×6): qty 2

## 2015-02-11 MED ORDER — SODIUM CHLORIDE 0.9 % IV SOLN
INTRAVENOUS | Status: DC
  Administered 2015-02-11: 125 mL/h via INTRAVENOUS

## 2015-02-11 MED ORDER — HEPARIN SODIUM (PORCINE) 5000 UNIT/ML IJ SOLN
5000.0000 [IU] | Freq: Three times a day (TID) | INTRAMUSCULAR | Status: DC
  Administered 2015-02-11 – 2015-02-13 (×7): 5000 [IU] via SUBCUTANEOUS
  Filled 2015-02-11 (×11): qty 1

## 2015-02-11 MED ORDER — LORAZEPAM 1 MG PO TABS
1.0000 mg | ORAL_TABLET | ORAL | Status: DC | PRN
Start: 2015-02-11 — End: 2015-02-13

## 2015-02-11 MED ORDER — LEVOTHYROXINE SODIUM 50 MCG PO TABS
50.0000 ug | ORAL_TABLET | Freq: Every day | ORAL | Status: DC
  Administered 2015-02-11 – 2015-02-13 (×3): 50 ug via ORAL
  Filled 2015-02-11 (×5): qty 1

## 2015-02-11 MED ORDER — OXYCODONE HCL 5 MG PO TABS
10.0000 mg | ORAL_TABLET | Freq: Four times a day (QID) | ORAL | Status: DC | PRN
  Administered 2015-02-11 – 2015-02-12 (×4): 10 mg via ORAL
  Filled 2015-02-11 (×4): qty 2

## 2015-02-11 MED ORDER — SUCRALFATE 1 G PO TABS
1.0000 g | ORAL_TABLET | Freq: Two times a day (BID) | ORAL | Status: DC
  Administered 2015-02-11 – 2015-02-12 (×4): 1 g via ORAL
  Filled 2015-02-11 (×6): qty 1

## 2015-02-11 MED ORDER — ROSUVASTATIN CALCIUM 40 MG PO TABS: 40.0000 mg | ORAL_TABLET | Freq: Every day | ORAL | Status: DC

## 2015-02-11 MED ORDER — METOCLOPRAMIDE HCL 5 MG/ML IJ SOLN
5.0000 mg | Freq: Four times a day (QID) | INTRAMUSCULAR | Status: DC
  Administered 2015-02-11 – 2015-02-12 (×4): 5 mg via INTRAVENOUS
  Filled 2015-02-11 (×8): qty 1

## 2015-02-11 MED ORDER — FLUTICASONE PROPIONATE 50 MCG/ACT NA SUSP
2.0000 | Freq: Every day | NASAL | Status: DC
  Administered 2015-02-11 – 2015-02-12 (×2): 2 via NASAL
  Filled 2015-02-11: qty 16

## 2015-02-11 MED ORDER — LEVOTHYROXINE SODIUM 200 MCG PO TABS
200.0000 ug | ORAL_TABLET | Freq: Every day | ORAL | Status: DC
  Administered 2015-02-11 – 2015-02-13 (×3): 200 ug via ORAL
  Filled 2015-02-11 (×5): qty 1

## 2015-02-11 MED ORDER — SODIUM CHLORIDE 0.9 % IV SOLN
INTRAVENOUS | Status: AC
  Administered 2015-02-11: 75 mL/h via INTRAVENOUS

## 2015-02-11 MED ORDER — PERFLUTREN LIPID MICROSPHERE
1.0000 mL | INTRAVENOUS | Status: AC | PRN
  Administered 2015-02-11: 3 mL via INTRAVENOUS
  Filled 2015-02-11: qty 10

## 2015-02-11 MED ORDER — ASPIRIN EC 81 MG PO TBEC
81.0000 mg | DELAYED_RELEASE_TABLET | Freq: Every day | ORAL | Status: DC
  Administered 2015-02-11 – 2015-02-12 (×2): 81 mg via ORAL
  Filled 2015-02-11 (×3): qty 1

## 2015-02-11 MED ORDER — FAMOTIDINE IN NACL 20-0.9 MG/50ML-% IV SOLN
20.0000 mg | INTRAVENOUS | Status: DC
  Administered 2015-02-11 – 2015-02-13 (×3): 20 mg via INTRAVENOUS
  Filled 2015-02-11 (×3): qty 50

## 2015-02-11 MED ORDER — SODIUM CHLORIDE 0.9 % IJ SOLN
3.0000 mL | Freq: Two times a day (BID) | INTRAMUSCULAR | Status: DC
  Administered 2015-02-11 – 2015-02-12 (×4): 3 mL via INTRAVENOUS

## 2015-02-11 NOTE — Progress Notes (Signed)
  Echocardiogram 2D Echocardiogram has been performed.  [name] 02/11/2015, 11:17 AM

## 2015-02-11 NOTE — H&P (Signed)
[facility] History and Physical  [name] ZOX:[id number] DOB: 1960-12-30 DOA: 02/10/2015  Referring physician: ED physician PCP: Cheral Bay, MD  Specialists:   Chief Complaint: Nausea, vomiting, diarrhea  HPI: [name] is a 54 y.o. female with PMH of hypertension, hyperlipidemia, diabetes mellitus, GERD, hypothyroidism, asthma, mixed connective tissue disease, who presents with nausea, vomiting, diarrhea.  Patient reports that she has nausea, vomiting and diarrhea for approximately one month. Symptoms have been worsening in the past 5-6 days. She reports that she vomited 5-6 times and had 4 bowel movements with loose stool today. She has mild abdominal pain. She could barely eat any food. She missed many doses of her home medications. She saw her little amount of blood in the stools. Of note, she reports that she used antibodies for severe lung infection approximately one month ago before her GI symptoms started. No symptoms of UTI. He has a chronic mild cough and minimal shortness of breath due to asthma, which he stopped changed. She does not have palpitation, dizziness or lightheadedness. She has bilateral leg edema, which she thinks likely due to recent prednisone use (she has not taken prednisone since 01/12/15).  Currently patient denies fever, chills, running nose, ear pain, headaches, chest pain, constipation, dysuria, urgency, frequency, hematuria, skin rashes, joint pain or leg swelling. No unilateral weakness, numbness or tingling sensations. No vision change or hearing loss.  In ED, patient was found to have WBC 6.6, temperature normal, no tachycardia, negative urinalysis. lactate 3.20. Troponin is elevated at 0.06, but no chest pain. Negative FOBT. EKG showed Q-wave in lead 3 and aVF, with poor R-wave progression, which are similar to previous EKG on 11/11/12. Blood pressure 146/67. Renal function normal. Chest x-ray is negative, but is limited study. Patient is admitted to  inpatient for further evaluation and treatment.  Where does patient live?   At home  Can patient participate in ADLs?  Yes   Review of Systems:   General: no fevers, chills, has poor appetite, has fatigue HEENT: no blurry vision, hearing changes or sore throat Pulm: has mild dyspnea, coughing, No wheezing CV: no chest pain, palpitations Abd: has nausea, vomiting, abdominal pain, diarrhea, no constipation GU: no dysuria, burning on urination, increased urinary frequency, hematuria  Ext: no leg edema Neuro: no unilateral weakness, numbness, or tingling, no vision change or hearing loss Skin: no rash MSK: No muscle spasm, no deformity, no limitation of range of movement in spin Heme: No easy bruising.  Travel history: No recent long distant travel.  Allergy:  Allergies  Allergen Reactions  . Fish Allergy Anaphylaxis  . Gabapentin Other (See Comments)    Anxiety on high doses  . Peanut-Containing Drug Products Shortness Of Breath  . Atrovent Other (See Comments)    Wheezing becomes worse  . Crestor [Rosuvastatin] Other (See Comments)    Extreme joint pain  . Ipratropium-Albuterol Other (See Comments)    Wheezing becomes worse, can take albuterol with ipratropium  . Penicillins Other (See Comments)    Unknown childhood reaction     Past Medical History  Diagnosis Date  . Asthma   . Hypertension   . Diabetes mellitus   . IBS (irritable bowel syndrome)   . GERD (gastroesophageal reflux disease)   . Eczema   . Thyroid disease   . Mixed connective tissue disease     Past Surgical History  Procedure Laterality Date  . Breast surgery    . Cholecystectomy    . Abdominal hysterectomy    .  Appendectomy    . Leg surgery Left     muscle biospy  . Wrist surgery Left     Social History:  reports that she quit smoking about 11 years ago. She does not have any smokeless tobacco history on file. She reports that she drinks alcohol. She reports that she does not use illicit  drugs.  Family History:  Family History  Problem Relation Age of Onset  . CAD Mother   . Hypertension Mother   . CAD Father      Prior to Admission medications   Medication Sig Start Date End Date Taking? Authorizing Provider  albuterol (PROVENTIL HFA;VENTOLIN HFA) 108 (90 BASE) MCG/ACT inhaler Inhale 2 puffs into the lungs every 4 (four) hours as needed for wheezing or shortness of breath. 11/14/12   Richarda Overlie, MD  aspirin EC 81 MG tablet Take 81 mg by mouth daily.    Historical Provider, MD  Cholecalciferol (VITAMIN D3) 10000 UNITS capsule Take 10,000 Units by mouth daily.    Historical Provider, MD  diphenhydrAMINE (BENADRYL) 25 MG tablet Take 1 tablet (25 mg total) by mouth every 6 (six) hours as needed for itching or allergies. 04/05/13   Fayrene Helper, PA-C  enalapril (VASOTEC) 10 MG tablet Take 10 mg by mouth 2 (two) times daily.    Historical Provider, MD  Estrogens, Conjugated (PREMARIN VA) Place 1 application vaginally 3 (three) times a week. On Saturdays, Mondays, and Wednesday (cream)    Historical Provider, MD  fentaNYL (DURAGESIC - DOSED MCG/HR) 100 MCG/HR Place 1 patch onto the skin every 3 (three) days.      Historical Provider, MD  fluticasone (FLONASE) 50 MCG/ACT nasal spray Place 2 sprays into the nose 2 (two) times daily.      Historical Provider, MD  gabapentin (NEURONTIN) 100 MG capsule Take 600 mg by mouth 2 (two) times daily.    Historical Provider, MD  Guaifenesin (MUCINEX MAXIMUM STRENGTH) 1200 MG TB12 Take 1,200 mg by mouth 2 (two) times daily.    Historical Provider, MD  insulin regular human CONCENTRATED (HUMULIN R) 500 UNIT/ML SOLN injection Inject 10-40 Units into the skin 3 (three) times daily with meals. Based on sliding scale    Historical Provider, MD  levalbuterol (XOPENEX) 1.25 MG/0.5ML nebulizer solution Take 1 ampule by nebulization 4 (four) times daily as needed for wheezing or shortness of breath.    Historical Provider, MD  levothyroxine (SYNTHROID,  LEVOTHROID) 200 MCG tablet Take 200 mcg by mouth daily. Take with 2 25 mcg tablets for a 250 mcg dose    Historical Provider, MD  levothyroxine (SYNTHROID, LEVOTHROID) 25 MCG tablet Take 50 mcg by mouth daily. Take with 200 mcg tablet for a 250 mcg dose    Historical Provider, MD  LORazepam (ATIVAN) 1 MG tablet Take 1 tablet (1 mg total) by mouth every 4 (four) hours as needed for anxiety. 08/01/13   Arthor Captain, PA-C  metFORMIN (GLUCOPHAGE) 1000 MG tablet Take 1,000 mg by mouth 2 (two) times daily with a meal.      Historical Provider, MD  metoCLOPramide (REGLAN) 10 MG tablet Take 10 mg by mouth 2 (two) times daily.      Historical Provider, MD  Multiple Vitamin (MULTIVITAMIN WITH MINERALS) TABS Take 1 tablet by mouth daily.    Historical Provider, MD  niacin (NIASPAN) 500 MG CR tablet Take 1,000 mg by mouth at bedtime.      Historical Provider, MD  omeprazole (PRILOSEC) 40 MG capsule Take 40 mg  by mouth daily.    Historical Provider, MD  ondansetron (ZOFRAN ODT) 4 MG disintegrating tablet 4mg  ODT q4 hours prn nausea/vomit 08/01/13   08/03/13, PA-C  Oxycodone HCl 10 MG TABS Take 10 mg by mouth every 4 (four) hours as needed (for pain).    Historical Provider, MD  rosuvastatin (CRESTOR) 40 MG tablet Take 1 tablet (40 mg total) by mouth daily. 11/14/12   01/14/13, MD  sucralfate (CARAFATE) 1 G tablet Take 1 g by mouth 2 (two) times daily.      Historical Provider, MD  vitamin B-12 (CYANOCOBALAMIN) 1000 MCG tablet Take 1,000 mcg by mouth daily.      Historical Provider, MD    Physical Exam: Filed Vitals:   02/10/15 2100 02/10/15 2130 02/10/15 2328 02/11/15 0056  BP: 146/70 143/70 148/77 143/74  Pulse: 96 91 87 83  Temp:    98.5 F (36.9 C)  TempSrc:    Oral  Resp: 16 16 18 18   Height:      Weight:      SpO2: 96% 97% 97% 96%   General: Not in acute distress HEENT:       Eyes: PERRL, EOMI, no scleral icterus.       ENT: No discharge from the ears and nose, no pharynx injection,  no tonsillar enlargement.        Neck: No JVD, no bruit, no mass felt. Heme: No neck lymph node enlargement. Cardiac: S1/S2, RRR, No murmurs, No gallops or rubs. Pulm: Good air movement bilaterally. No rales, wheezing, rhonchi or rubs. Abd: Soft, nondistended, mild tenderness diffusely, no rebound pain, no organomegaly, BS present. Ext: 1+ pitting leg edema bilaterally. 2+DP/PT pulse bilaterally. Musculoskeletal: No joint deformities, No joint redness or warmth, no limitation of ROM in spin. Skin: No rashes.  Neuro: Alert, oriented X3, cranial nerves II-XII grossly intact, muscle strength 5/5 in all extremities, sensation to light touch intact. Psych: Patient is not psychotic, no suicidal or hemocidal ideation.  Labs on Admission:  Basic Metabolic Panel:  Recent Labs Lab 02/10/15 1740  NA 139  K 3.7  CL 100*  CO2 27  GLUCOSE 222*  BUN <5*  CREATININE 0.72  CALCIUM 9.4   Liver Function Tests:  Recent Labs Lab 02/10/15 1740  AST 47*  ALT 47  ALKPHOS 63  BILITOT 0.3  PROT 6.9  ALBUMIN 4.0   No results for input(s): LIPASE, AMYLASE in the last 168 hours. No results for input(s): AMMONIA in the last 168 hours. CBC:  Recent Labs Lab 02/10/15 1740  WBC 6.6  NEUTROABS 4.3  HGB 13.9  HCT 44.2  MCV 89.5  PLT 237   Cardiac Enzymes:  Recent Labs Lab 02/10/15 1740  TROPONINI 0.06*    BNP (last 3 results) No results for input(s): BNP in the last 8760 hours.  ProBNP (last 3 results) No results for input(s): PROBNP in the last 8760 hours.  CBG: No results for input(s): GLUCAP in the last 168 hours.  Radiological Exams on Admission: Dg Chest Port 1 View  02/10/2015   CLINICAL DATA:  Emesis.  Short of breath with nausea and vomiting.  EXAM: PORTABLE CHEST - 1 VIEW  COMPARISON:  11/11/2012.  FINDINGS: Exam is under penetrated and there is overlapping soft tissue in the central chest from the breasts. No gross consolidation is identified. The cardiopericardial  silhouette appears within normal limits. No gross free air underneath the hemidiaphragms.  IMPRESSION: 1. Moderately technically degraded study. 2. No gross acute cardiopulmonary  disease. Consider departmental repeat PA and lateral chest when patient condition permits.   Electronically Signed   By: Andreas Newport M.D.   On: 02/10/2015 20:02    EKG: Independently reviewed.  Abnormal findings: Q-wave in lead 3 and aVF, with poor R-wave progression, which are similar to previous EKG on 11/11/12  Assessment/Plan Principal Problem:   Nausea vomiting and diarrhea Active Problems:   Essential hypertension, benign   Mixed connective tissue disease   Hypothyroidism   Asthma   Diabetes mellitus without complication   Leg edema  Nausea vomiting and diarrhea: Has been going on for about one month. Etiology is not clear. Need to rule out c diff colitis given antibiotic use prior to the symptoms. Patient is not septic on admission. Hemodynamically stable -will admit to tele bed -Symptomatic control: When necessary Hydroxyzine for nausea, morphine and oxycodone for pain. Patient is also on fentanyl chronically. -IV fluid: Received 1 L of normal saline, followed by 75 mL per hour -Check C. difficile PCR, GI pathogen panel -will get Procalcitonin and trend lactic acid level  -Hold the Protonix until c diff pcr negative  Essential hypertension, benign: -Enalapril,  HLD: No LDL records patient is taking Crestor at home -Continue Crestor -Check FLP  DM-II: Last A1c 8.3 on 11/12/12. Patient is taking metformin and sliding scale insulin at home -SSI -Check A1c -Neurontin for peripheral neuropathy  Mixed connective tissue disease: Patient reports that she has been followed up by Dr. Ancil Linsey. She is taking plaquenil 200 mg twice a day (it is not her med list). She has had yearly eye check, which was normal so far. Her liver enzymes are slightly elevated with AST 47, ALT 47. She has an appointment with  rheumatologist next week. -follow up with Dr. Ancil Linsey  Hypothyroidism: Last TSH was 2.0451 on 12/06/08. -Continue home Synthroid  Asthma: Stable. No signs of acute exacerbation. -Continue albuterol nebulizers when necessary  Bilateral Leg edema: Etiology is not clear. The renal function is normal. Patient does not carry diagnosis of congestive heart failure, but her 2-D echo on 11/12/12 showed EF of 40-45%, indicating possible congestive heart failure. Today her troponin is slightly elevated at 0.06 (I don't know why she was checked for trop in ED). She does not have any chest pain. EKG has no new change. -Trend trop x 3 - check 2e echo - BNP - repeat EKG in am  GERD: -hold Protonix as above -IV pepcide -sucralfate  DVT ppx: SQ Heparin     Code Status: Full code Family Communication:  Yes, patient's  husband  at bed side Disposition Plan: Admit to inpatient   Date of Service 02/11/2015    Lorretta Harp [facility] Pager [phone]  If 7PM-7AM, please contact night-coverage [email or web address] Password [email or web address] 02/11/2015, 2:59 AM

## 2015-02-11 NOTE — Progress Notes (Signed)
[facility] PROGRESS NOTE  [name] JYN:[id number] DOB: 1960-10-03 DOA: 02/10/2015  PCP: Cheral Bay, MD  Brief HPI: 54 year old Caucasian female with morbid obesity, hypertension, diabetes, GERD, hypothyroidism, mixed connective tissue disorder with chronic pain is entered with nausea, vomiting and diarrhea. Most of the symptoms have been ongoing for at least a month. But they got worse a few days ago. She's had persistent nausea with very poor oral intake for the last 1 month. She's had multiple episodes of diarrhea over the last few days but otherwise has 2-3 loose stools every day. She also reported a history of IBS. She was admitted for further management.  Past medical history:  Past Medical History  Diagnosis Date  . Asthma   . Hypertension   . Diabetes mellitus   . IBS (irritable bowel syndrome)   . GERD (gastroesophageal reflux disease)   . Eczema   . Thyroid disease   . Mixed connective tissue disease     Consultants: None  Procedures:  2-D echocardiogram is pending  Antibiotics: None  Subjective: Patient reports nausea but no vomiting. Diarrhea appears to have subsided. Has pain all over, which she describes as her usual chronic pain. Denies any chest pain or shortness of breath.  Objective: Vital Signs  Filed Vitals:   02/10/15 2328 02/11/15 0056 02/11/15 0318 02/11/15 0552  BP: 148/77 143/74 122/65 123/59  Pulse: 87 83 90 92  Temp:  98.5 F (36.9 C) 98.6 F (37 C) 98.4 F (36.9 C)  TempSrc:  Oral Oral Oral  Resp: 18 18 18 18   Height:    5\' 2"  (1.575 m)  Weight:    131.1 kg (289 lb 0.4 oz)  SpO2: 97% 96% 100% 97%   No intake or output data in the 24 hours ending 02/11/15 0901 Filed Weights   02/10/15 1556 02/11/15 0552  Weight: 127.007 kg (280 lb) 131.1 kg (289 lb 0.4 oz)    General appearance: alert, cooperative, appears stated age and no distress Resp: clear to auscultation bilaterally Cardio: regular rate and rhythm, S1, S2  normal, no murmur, click, rub or gallop GI: soft, non-tender; bowel sounds normal; no masses,  no organomegaly Extremities: extremities normal, atraumatic, no cyanosis or edema Neurologic: No focal deficits  Lab Results:  Basic Metabolic Panel:  Recent Labs Lab 02/10/15 1740 02/11/15 0310  NA 139 141  K 3.7 3.4*  CL 100* 103  CO2 27 26  GLUCOSE 222* 159*  BUN <5* <5*  CREATININE 0.72 0.82  CALCIUM 9.4 8.8*   Liver Function Tests:  Recent Labs Lab 02/10/15 1740 02/11/15 0310  AST 47* 38  ALT 47 40  ALKPHOS 63 52  BILITOT 0.3 0.5  PROT 6.9 6.6  ALBUMIN 4.0 3.5   CBC:  Recent Labs Lab 02/10/15 1740 02/11/15 0310  WBC 6.6 6.4  NEUTROABS 4.3  --   HGB 13.9 12.9  HCT 44.2 41.0  MCV 89.5 90.1  PLT 237 216   Cardiac Enzymes:  Recent Labs Lab 02/10/15 1740 02/11/15 0310 02/11/15 0744  TROPONINI 0.06* 0.04* <0.03   BNP (last 3 results)  Recent Labs  02/11/15 0310  BNP 62.6    CBG:  Recent Labs Lab 02/11/15 0816  GLUCAP 158*    Recent Results (from the past 240 hour(s))  Culture, blood (routine x 2)     Status: None (Preliminary result)   Collection Time: 02/10/15  6:15 PM  Result Value Ref Range Status   Specimen Description BLOOD RIGHT HAND  Final  Special Requests   Final    BOTTLES DRAWN AEROBIC AND ANAEROBIC BLUE 5CC RED 2.5CC   Culture   Final           BLOOD CULTURE RECEIVED NO GROWTH TO DATE CULTURE WILL BE HELD FOR 5 DAYS BEFORE ISSUING A FINAL NEGATIVE REPORT Performed at [facility]    Report Status PENDING  Incomplete  Culture, blood (routine x 2)     Status: None (Preliminary result)   Collection Time: 02/10/15  6:20 PM  Result Value Ref Range Status   Specimen Description BLOOD LEFT HAND  Final   Special Requests BOTTLES DRAWN AEROBIC AND ANAEROBIC 4CC EACH  Final   Culture   Final           BLOOD CULTURE RECEIVED NO GROWTH TO DATE CULTURE WILL BE HELD FOR 5 DAYS BEFORE ISSUING A FINAL NEGATIVE REPORT Performed  at [facility]    Report Status PENDING  Incomplete      Studies/Results: Dg Chest Port 1 View  02/10/2015   CLINICAL DATA:  Emesis.  Short of breath with nausea and vomiting.  EXAM: PORTABLE CHEST - 1 VIEW  COMPARISON:  11/11/2012.  FINDINGS: Exam is under penetrated and there is overlapping soft tissue in the central chest from the breasts. No gross consolidation is identified. The cardiopericardial silhouette appears within normal limits. No gross free air underneath the hemidiaphragms.  IMPRESSION: 1. Moderately technically degraded study. 2. No gross acute cardiopulmonary disease. Consider departmental repeat PA and lateral chest when patient condition permits.   Electronically Signed   By: Andreas Newport M.D.   On: 02/10/2015 20:02    Medications:  Scheduled: . sodium chloride   Intravenous STAT  . aspirin EC  81 mg Oral Daily  . cholecalciferol  1,000 Units Oral Daily  . enalapril  10 mg Oral BID  . famotidine (PEPCID) IV  20 mg Intravenous Q24H  . fentaNYL  100 mcg Transdermal Q48H  . fluticasone  2 spray Each Nare Daily  . gabapentin  600 mg Oral BID  . guaiFENesin  1,200 mg Oral BID  . heparin  5,000 Units Subcutaneous 3 times per day  . hydroxychloroquine  200 mg Oral BID  . insulin aspart  0-15 Units Subcutaneous TID WC  . levothyroxine  200 mcg Oral QAC breakfast  . levothyroxine  50 mcg Oral QAC breakfast  . metoCLOPramide (REGLAN) injection  5 mg Intravenous 4 times per day  . multivitamin with minerals  1 tablet Oral Daily  . niacin  1,000 mg Oral QHS  . potassium chloride  10 mEq Intravenous Q1 Hr x 4  . sodium chloride  3 mL Intravenous Q12H  . sucralfate  1 g Oral BID  . vitamin B-12  1,000 mcg Oral Daily   Continuous:  [id number], HYDROmorphone (DILAUDID) injection, LORazepam, ondansetron, oxyCODONE  Assessment/Plan:  Principal Problem:   Nausea vomiting and diarrhea Active Problems:   Essential hypertension, benign   Mixed connective tissue  disease   Hypothyroidism   Asthma   Diabetes mellitus without complication   Leg edema    Nausea, vomiting and diarrhea Most of the symptoms have been ongoing for at least a month. She does report a history of IBS. She apparently had a colonoscopy within the last 1-2 years at [facility]. Polyps were found. She was told that she will need a repeat colonoscopy in 10 years. She does have a history of diabetes. Nausea could suggest diabetic gastroparesis. Diarrhea appears  to have improved. We will give her a trial of metoclopramide. Continue IV fluids. Stool studies are pending. Initial lactic acid level was elevated but subsequently they have been normal. Replace potassium  History of mixed connective tissue disorder She is followed by Dr. Ancil Linsey who is a rheumatologist in [place]. Continue hydroxychloroquine. She has a follow-up appointment with her rheumatologist next week.  History of essential hypertension Blood pressure is relatively stable. Continue current medications.  History of diabetes mellitus type 2 CBGs are reasonably well controlled. Continue sliding scale coverage. Continue her Neurontin. It's possible that her nausea could be due to diabetic gastroparesis as discussed above. Trial of metoclopramide. HbA1c is pending  Hypothyroidism Continue Synthroid  History of asthma Currently stable.  Bilateral lower extremity edema Swelling does not seem very impressive currently. Echocardiogram has been ordered. There is one report of echocardiogram from 2014, which suggested an EF of 40-45%. However, the study was technically difficult. The echo was reviewed further by cardiologist and it was felt that she may have normal systolic function. BNP is normal.  History of GERD Continue Pepcid and sucralfate  Chronic pain Secondary to her connective tissue disorder. She is on a fentanyl patch, which will be continued.  DVT Prophylaxis: Subcutaneous heparin    Code Status: Full  code  Family Communication: Discussed with the patient and her husband  Disposition Plan: Continue current treatment. Will likely return home when improved.   Follow-up Appointment?: Will need to follow-up with her primary care physician and rheumatologist after discharge.    LOS: 1 day   [facility]  [facility] Pager [phone] 02/11/2015, 9:01 AM  If 7PM-7AM, please contact night-coverage at [email or web address], password [email or web address]

## 2015-02-12 DIAGNOSIS — A0472 Enterocolitis due to Clostridium difficile, not specified as recurrent: Secondary | ICD-10-CM | POA: Diagnosis present

## 2015-02-12 DIAGNOSIS — M358 Other specified systemic involvement of connective tissue: Secondary | ICD-10-CM

## 2015-02-12 DIAGNOSIS — E039 Hypothyroidism, unspecified: Secondary | ICD-10-CM

## 2015-02-12 DIAGNOSIS — E119 Type 2 diabetes mellitus without complications: Secondary | ICD-10-CM

## 2015-02-12 DIAGNOSIS — A047 Enterocolitis due to Clostridium difficile: Principal | ICD-10-CM

## 2015-02-12 DIAGNOSIS — R110 Nausea: Secondary | ICD-10-CM

## 2015-02-12 LAB — HEMOGLOBIN A1C
Hgb A1c MFr Bld: 8 % — ABNORMAL HIGH (ref 4.8–5.6)
Mean Plasma Glucose: 183 mg/dL

## 2015-02-12 LAB — BASIC METABOLIC PANEL
Anion gap: 11 (ref 5–15)
BUN: <5 mg/dL — ABNORMAL LOW (ref 6–20)
CO2: 25 mmol/L (ref 22–32)
Calcium: 8.6 mg/dL — ABNORMAL LOW (ref 8.9–10.3)
Chloride: 104 mmol/L (ref 101–111)
Creatinine, Ser: 0.9 mg/dL (ref 0.44–1.00)
GFR calc Af Amer: >60 mL/min (ref >60)
GFR calc non Af Amer: >60 mL/min (ref >60)
Glucose, Bld: 192 mg/dL — ABNORMAL HIGH (ref 65–99)
Potassium: 3.8 mmol/L (ref 3.5–5.1)
Sodium: 140 mmol/L (ref 135–145)

## 2015-02-12 LAB — GLUCOSE, CAPILLARY
Glucose-Capillary: 143 mg/dL — ABNORMAL HIGH (ref 65–99)
Glucose-Capillary: 149 mg/dL — ABNORMAL HIGH (ref 65–99)
Glucose-Capillary: 149 mg/dL — ABNORMAL HIGH (ref 65–99)
Glucose-Capillary: 163 mg/dL — ABNORMAL HIGH (ref 65–99)

## 2015-02-12 MED ORDER — METOCLOPRAMIDE HCL 5 MG PO TABS
5.0000 mg | ORAL_TABLET | Freq: Three times a day (TID) | ORAL | Status: DC
  Administered 2015-02-12 – 2015-02-13 (×4): 5 mg via ORAL
  Filled 2015-02-12 (×7): qty 1

## 2015-02-12 MED ORDER — SACCHAROMYCES BOULARDII 250 MG PO CAPS
250.0000 mg | ORAL_CAPSULE | Freq: Two times a day (BID) | ORAL | Status: DC
  Administered 2015-02-12: 250 mg via ORAL
  Filled 2015-02-12 (×4): qty 1

## 2015-02-12 MED ORDER — VANCOMYCIN 50 MG/ML ORAL SOLUTION
125.0000 mg | Freq: Four times a day (QID) | ORAL | Status: DC
  Administered 2015-02-12 (×4): 125 mg via ORAL
  Filled 2015-02-12 (×8): qty 2.5

## 2015-02-12 NOTE — Progress Notes (Signed)
[facility] PROGRESS NOTE  [name] ZDG:[id number] DOB: 01-31-1961 DOA: 02/10/2015  PCP: Cheral Bay, MD  Brief HPI: 54 year old Caucasian female with morbid obesity, hypertension, diabetes, GERD, hypothyroidism, mixed connective tissue disorder with chronic pain is entered with nausea, vomiting and diarrhea. Most of the symptoms have been ongoing for at least a month. But they got worse a few days ago. She's had persistent nausea with very poor oral intake for the last 1 month. She's had multiple episodes of diarrhea over the last few days but otherwise has 2-3 loose stools every day. She also reported a history of IBS. She was admitted for further management. Stool was positive for C. difficile. She was started on oral vancomycin.  Past medical history:  Past Medical History  Diagnosis Date  . Asthma   . Hypertension   . Diabetes mellitus   . IBS (irritable bowel syndrome)   . GERD (gastroesophageal reflux disease)   . Eczema   . Thyroid disease   . Mixed connective tissue disease     Consultants: None  Procedures:  2-D echocardiogram Study Conclusions - Left ventricle: The cavity size was normal. Systolic function wasnormal. The estimated ejection fraction was in the range of 55%to 60%. Although no diagnostic regional wall motion abnormalitywas identified, this possibility cannot be completely excluded onthe basis of this study.  Antibiotics: None  Subjective: Patient complains of generalized body ache. Especially in the back due to her history of degenerative disc disease. Had about 4-5 loose stools yesterday and overnight. Nausea appears to be improved. Was able to tolerate diet.   Objective: Vital Signs  Filed Vitals:   02/11/15 1110 02/11/15 1419 02/11/15 2117 02/12/15 0614  BP: 159/84 141/79 126/74 110/60  Pulse: 87 85 88 77  Temp:  98.4 F (36.9 C) 98.3 F (36.8 C) 98.4 F (36.9 C)  TempSrc:  Oral Oral Oral  Resp:  17 18 18   Height:        Weight:      SpO2:  98% 97% 94%    Intake/Output Summary (Last 24 hours) at 02/12/15 1342 Last data filed at 02/12/15 0933  Gross per 24 hour  Intake    360 ml  Output      0 ml  Net    360 ml   Filed Weights   02/10/15 1556 02/11/15 0552  Weight: 127.007 kg (280 lb) 131.1 kg (289 lb 0.4 oz)    General appearance: alert, cooperative, appears stated age and appears to be in some discomfort.  Resp: clear to auscultation bilaterally Cardio: regular rate and rhythm, S1, S2 normal, no murmur, click, rub or gallop GI: soft, non-tender; bowel sounds normal; no masses,  no organomegaly Extremities: extremities normal, atraumatic, no cyanosis or edema Neurologic: No focal deficits  Lab Results:  Basic Metabolic Panel:  Recent Labs Lab 02/10/15 1740 02/11/15 0310 02/12/15 0946  NA 139 141 140  K 3.7 3.4* 3.8  CL 100* 103 104  CO2 27 26 25   GLUCOSE 222* 159* 192*  BUN <5* <5* <5*  CREATININE 0.72 0.82 0.90  CALCIUM 9.4 8.8* 8.6*   Liver Function Tests:  Recent Labs Lab 02/10/15 1740 02/11/15 0310  AST 47* 38  ALT 47 40  ALKPHOS 63 52  BILITOT 0.3 0.5  PROT 6.9 6.6  ALBUMIN 4.0 3.5   CBC:  Recent Labs Lab 02/10/15 1740 02/11/15 0310  WBC 6.6 6.4  NEUTROABS 4.3  --   HGB 13.9 12.9  HCT 44.2 41.0  MCV  89.5 90.1  PLT 237 216   Cardiac Enzymes:  Recent Labs Lab 02/10/15 1740 02/11/15 0310 02/11/15 0744 02/11/15 1435  TROPONINI 0.06* 0.04* <0.03 <0.03   BNP (last 3 results)  Recent Labs  02/11/15 0310  BNP 62.6    CBG:  Recent Labs Lab 02/11/15 1158 02/11/15 1804 02/11/15 2114 02/12/15 0811 02/12/15 1206  GLUCAP 151* 184* 164* 143* 149*    Recent Results (from the past 240 hour(s))  Culture, blood (routine x 2)     Status: None (Preliminary result)   Collection Time: 02/10/15  6:15 PM  Result Value Ref Range Status   Specimen Description BLOOD RIGHT HAND  Final   Special Requests   Final    BOTTLES DRAWN AEROBIC AND ANAEROBIC  BLUE 5CC RED 2.5CC   Culture   Final           BLOOD CULTURE RECEIVED NO GROWTH TO DATE CULTURE WILL BE HELD FOR 5 DAYS BEFORE ISSUING A FINAL NEGATIVE REPORT Performed at [facility]    Report Status PENDING  Incomplete  Culture, blood (routine x 2)     Status: None (Preliminary result)   Collection Time: 02/10/15  6:20 PM  Result Value Ref Range Status   Specimen Description BLOOD LEFT HAND  Final   Special Requests BOTTLES DRAWN AEROBIC AND ANAEROBIC 4CC EACH  Final   Culture   Final           BLOOD CULTURE RECEIVED NO GROWTH TO DATE CULTURE WILL BE HELD FOR 5 DAYS BEFORE ISSUING A FINAL NEGATIVE REPORT Performed at [facility]    Report Status PENDING  Incomplete  Clostridium Difficile by PCR     Status: Abnormal   Collection Time: 02/11/15  5:33 PM  Result Value Ref Range Status   C difficile by pcr POSITIVE (A) NEGATIVE Final    Comment: CRITICAL RESULT CALLED TO, READ BACK BY AND VERIFIED WITH: A [facility] RN 2203 02/11/15 A BROWNING       Studies/Results: Dg Chest Port 1 View  02/10/2015   CLINICAL DATA:  Emesis.  Short of breath with nausea and vomiting.  EXAM: PORTABLE CHEST - 1 VIEW  COMPARISON:  11/11/2012.  FINDINGS: Exam is under penetrated and there is overlapping soft tissue in the central chest from the breasts. No gross consolidation is identified. The cardiopericardial silhouette appears within normal limits. No gross free air underneath the hemidiaphragms.  IMPRESSION: 1. Moderately technically degraded study. 2. No gross acute cardiopulmonary disease. Consider departmental repeat PA and lateral chest when patient condition permits.   Electronically Signed   By: Andreas Newport M.D.   On: 02/10/2015 20:02    Medications:  Scheduled: . aspirin EC  81 mg Oral Daily  . cholecalciferol  1,000 Units Oral Daily  . enalapril  10 mg Oral BID  . famotidine (PEPCID) IV  20 mg Intravenous Q24H  . fentaNYL  100 mcg Transdermal Q48H  . fluticasone  2 spray  Each Nare Daily  . gabapentin  600 mg Oral BID  . guaiFENesin  1,200 mg Oral BID  . heparin  5,000 Units Subcutaneous 3 times per day  . hydroxychloroquine  200 mg Oral BID  . insulin aspart  0-15 Units Subcutaneous TID WC  . levothyroxine  200 mcg Oral QAC breakfast  . levothyroxine  50 mcg Oral QAC breakfast  . metoCLOPramide  5 mg Oral TID AC  . multivitamin with minerals  1 tablet Oral Daily  . niacin  1,000 mg Oral QHS  . saccharomyces boulardii  250 mg Oral BID  . sodium chloride  3 mL Intravenous Q12H  . sucralfate  1 g Oral BID  . vancomycin  125 mg Oral QID  . vitamin B-12  1,000 mcg Oral Daily   Continuous:  TGY:BWLSLHTDS, HYDROmorphone (DILAUDID) injection, LORazepam, ondansetron, oxyCODONE  Assessment/Plan:  Principal Problem:   C. difficile diarrhea Active Problems:   Essential hypertension, benign   Mixed connective tissue disease   Hypothyroidism   Asthma   Nausea vomiting and diarrhea   Diabetes mellitus without complication   Leg edema    C diff diarrhea Likely reason for her presentation. We will initiate oral vancomycin. Avoid Flagyl due to her nausea. Special educational needs teacher.  Nausea, vomiting  Most of the symptoms have been ongoing for at least a month. She does report a history of IBS. She apparently had a colonoscopy within the last 1-2 years at [facility]. Polyps were found. She was told that she will need a repeat colonoscopy in 10 years. She does have a history of diabetes. Nausea could suggest diabetic gastroparesis. Nausea could also be due to her C. difficile. Appears to have improved with initiation of metoclopramide. Change to oral today. Watch for worsening of diarrhea.   History of mixed connective tissue disorder She is followed by Dr. Ancil Linsey who is a rheumatologist in [place]. Continue hydroxychloroquine. She has a follow-up appointment with her rheumatologist next week.  Acute on Chronic generalized pain, likely exacerbated by acute  illness Secondary to her connective tissue disorder. She is on a fentanyl patch, which will be continued. On medication for breakthrough pain as well.  History of essential hypertension Blood pressure is relatively stable. Continue current medications.  History of diabetes mellitus type 2 CBGs are reasonably well controlled. Continue sliding scale coverage. Continue her Neurontin. It's possible that her nausea could be due to diabetic gastroparesis as discussed above. HbA1c is 8  Hypothyroidism Continue Synthroid  History of asthma Currently stable.  Bilateral lower extremity edema Swelling does not seem very impressive currently. Echocardiogram is as above. Systolic function is normal. There is one report of echocardiogram from 2014, which suggested an EF of 40-45%. However, the study was technically difficult. The echo was reviewed further by cardiologist and it was felt that she may have normal systolic function. BNP is normal.  History of GERD Continue Pepcid and sucralfate  DVT Prophylaxis: Subcutaneous heparin    Code Status: Full code  Family Communication: Discussed with the patient and her husband  Disposition Plan: Hopefully her diarrhea will improve with oral vancomycin. Discharge in next 1-2 days.   Follow-up Appointment?: Will need to follow-up with her primary care physician and rheumatologist after discharge.    LOS: 2 days   [facility]  [facility] Pager [phone] 02/12/2015, 1:42 PM  If 7PM-7AM, please contact night-coverage at [email or web address], password [facility]

## 2015-02-13 LAB — BASIC METABOLIC PANEL
Anion gap: 10 (ref 5–15)
BUN: <5 mg/dL — ABNORMAL LOW (ref 6–20)
CO2: 27 mmol/L (ref 22–32)
Calcium: 9.1 mg/dL (ref 8.9–10.3)
Chloride: 105 mmol/L (ref 101–111)
Creatinine, Ser: 0.76 mg/dL (ref 0.44–1.00)
GFR calc Af Amer: >60 mL/min (ref >60)
GFR calc non Af Amer: >60 mL/min (ref >60)
Glucose, Bld: 120 mg/dL — ABNORMAL HIGH (ref 65–99)
Potassium: 3.7 mmol/L (ref 3.5–5.1)
Sodium: 142 mmol/L (ref 135–145)

## 2015-02-13 LAB — CBC
HCT: 39.4 % (ref 36.0–46.0)
Hemoglobin: 12.3 g/dL (ref 12.0–15.0)
MCH: 28.2 pg (ref 26.0–34.0)
MCHC: 31.2 g/dL (ref 30.0–36.0)
MCV: 90.4 fL (ref 78.0–100.0)
Platelets: 199 10*3/uL (ref 150–400)
RBC: 4.36 MIL/uL (ref 3.87–5.11)
RDW: 15.3 % (ref 11.5–15.5)
WBC: 5.8 10*3/uL (ref 4.0–10.5)

## 2015-02-13 LAB — GLUCOSE, CAPILLARY: Glucose-Capillary: 147 mg/dL — ABNORMAL HIGH (ref 65–99)

## 2015-02-13 MED ORDER — VANCOMYCIN HCL 125 MG PO CAPS: 125.0000 mg | ORAL_CAPSULE | Freq: Four times a day (QID) | ORAL | Status: DC

## 2015-02-13 MED ORDER — SACCHAROMYCES BOULARDII 250 MG PO CAPS: 250.0000 mg | ORAL_CAPSULE | Freq: Two times a day (BID) | ORAL | Status: DC

## 2015-02-13 MED ORDER — METOCLOPRAMIDE HCL 5 MG PO TABS: 5.0000 mg | ORAL_TABLET | Freq: Three times a day (TID) | ORAL | Status: DC

## 2015-02-13 NOTE — Discharge Summary (Signed)
[facility]  Physician Discharge Summary   Patient ID: [name] MRN: [id number] DOB/AGE: Sep 13, 1960 54 y.o.  Admit date: 02/10/2015 Discharge date: 02/13/2015  PCP: Cheral Bay, MD  DISCHARGE DIAGNOSES:  Principal Problem:   C. difficile diarrhea Active Problems:   Essential hypertension, benign   Mixed connective tissue disease   Hypothyroidism   Asthma   Nausea vomiting and diarrhea   Diabetes mellitus without complication   Leg edema   RECOMMENDATIONS FOR OUTPATIENT FOLLOW UP: 1. Patient advised to follow-up with her primary care provider or her gastroenterologist 2. Polypharmacy is noted. To be addressed as an outpatient 3. May need further workup for nausea including evaluation for diabetic gastroparesis.  DISCHARGE CONDITION: fair  Diet recommendation: Modified carbohydrate  Filed Weights   02/10/15 1556 02/11/15 0552  Weight: 127.007 kg (280 lb) 131.1 kg (289 lb 0.4 oz)    INITIAL HISTORY: 54 year old Caucasian female with morbid obesity, hypertension, diabetes, GERD, hypothyroidism, mixed connective tissue disorder with chronic pain is entered with nausea, vomiting and diarrhea. Most of the symptoms have been ongoing for at least a month. But they got worse a few days ago. She's had persistent nausea with very poor oral intake for the last 1 month. She's had multiple episodes of diarrhea over the last few days but otherwise has 2-3 loose stools every day. She also reported a history of IBS. She was admitted for further management. Stool was positive for C. difficile. She was started on oral vancomycin.  Procedures: 2-D echocardiogram Study Conclusions - Left ventricle: The cavity size was normal. Systolic function wasnormal. The estimated ejection fraction was in the range of 55%to 60%. Although no diagnostic regional wall motion abnormalitywas identified, this possibility cannot be completely excluded onthe basis of this study.  HOSPITAL  COURSE:   C diff diarrhea This was the most likely reason for her presentation. Patient was started on oral vancomycin. Flagyl was not initiated due to her significant nausea. She was also given Florastor.  Nausea, vomiting  Most of the symptoms have been ongoing for at least a month. She does report a history of IBS. She apparently had a colonoscopy within the last 1-2 years at [facility]. Polyps were found. She was told that she will need a repeat colonoscopy in 10 years. She does have a history of diabetes. Nausea could suggest diabetic gastroparesis. Nausea could also be due to her C. difficile. He was started initially on intravenous metoclopramide. This was changed over to before meals orally. Much better in terms of her symptoms and able to tolerate her diet. She will follow-up with her gastroenterologist for further workup.   History of mixed connective tissue disorder She is followed by Dr. Ancil Linsey who is a rheumatologist in [place]. Continue hydroxychloroquine. She has a follow-up appointment with her rheumatologist next week.  Acute on Chronic generalized pain, likely exacerbated by acute illness Secondary to her connective tissue disorder. She is on a fentanyl patch. On medication for breakthrough pain as well.  History of essential hypertension Blood pressure is relatively stable. Continue current medications.  History of diabetes mellitus type 2 CBGs are reasonably well controlled. She may resume her home medication regimen. Continue her Neurontin. It's possible that her nausea could be due to diabetic gastroparesis as discussed above. HbA1c is 8  Hypothyroidism Continue Synthroid  History of asthma Currently stable.  Bilateral lower extremity edema Swelling does not seem very impressive currently. Echocardiogram is as above. Systolic function is normal. There is one report of echocardiogram  from 2014, which suggested an EF of 40-45%. However, the study was technically  difficult. The echo was reviewed further by cardiologist and it was felt that she may have normal systolic function. BNP is normal.  History of GERD Stable  Overall, patient has improved. She denies any significant diarrhea. Nausea is better and is able to tolerate her diet. Stable for discharge.   PERTINENT LABS:  The results of significant diagnostics from this hospitalization (including imaging, microbiology, ancillary and laboratory) are listed below for reference.    Microbiology: Recent Results (from the past 240 hour(s))  Culture, blood (routine x 2)     Status: None (Preliminary result)   Collection Time: 02/10/15  6:15 PM  Result Value Ref Range Status   Specimen Description BLOOD RIGHT HAND  Final   Special Requests   Final    BOTTLES DRAWN AEROBIC AND ANAEROBIC BLUE 5CC RED 2.5CC   Culture   Final           BLOOD CULTURE RECEIVED NO GROWTH TO DATE CULTURE WILL BE HELD FOR 5 DAYS BEFORE ISSUING A FINAL NEGATIVE REPORT Performed at [facility]    Report Status PENDING  Incomplete  Culture, blood (routine x 2)     Status: None (Preliminary result)   Collection Time: 02/10/15  6:20 PM  Result Value Ref Range Status   Specimen Description BLOOD LEFT HAND  Final   Special Requests BOTTLES DRAWN AEROBIC AND ANAEROBIC 4CC EACH  Final   Culture   Final           BLOOD CULTURE RECEIVED NO GROWTH TO DATE CULTURE WILL BE HELD FOR 5 DAYS BEFORE ISSUING A FINAL NEGATIVE REPORT Performed at [facility]    Report Status PENDING  Incomplete  Clostridium Difficile by PCR     Status: Abnormal   Collection Time: 02/11/15  5:33 PM  Result Value Ref Range Status   C difficile by pcr POSITIVE (A) NEGATIVE Final    Comment: CRITICAL RESULT CALLED TO, READ BACK BY AND VERIFIED WITH: A [facility] RN 2203 02/11/15 A BROWNING      Labs: Basic Metabolic Panel:  Recent Labs Lab 02/10/15 1740 02/11/15 0310 02/12/15 0946 02/13/15 0428  NA 139 141 140 142  K 3.7 3.4* 3.8  3.7  CL 100* 103 104 105  CO2 27 26 25 27   GLUCOSE 222* 159* 192* 120*  BUN <5* <5* <5* <5*  CREATININE 0.72 0.82 0.90 0.76  CALCIUM 9.4 8.8* 8.6* 9.1   Liver Function Tests:  Recent Labs Lab 02/10/15 1740 02/11/15 0310  AST 47* 38  ALT 47 40  ALKPHOS 63 52  BILITOT 0.3 0.5  PROT 6.9 6.6  ALBUMIN 4.0 3.5   CBC:  Recent Labs Lab 02/10/15 1740 02/11/15 0310 02/13/15 0428  WBC 6.6 6.4 5.8  NEUTROABS 4.3  --   --   HGB 13.9 12.9 12.3  HCT 44.2 41.0 39.4  MCV 89.5 90.1 90.4  PLT 237 216 199   Cardiac Enzymes:  Recent Labs Lab 02/10/15 1740 02/11/15 0310 02/11/15 0744 02/11/15 1435  TROPONINI 0.06* 0.04* <0.03 <0.03   BNP: BNP (last 3 results)  Recent Labs  02/11/15 0310  BNP 62.6    CBG:  Recent Labs Lab 02/12/15 0811 02/12/15 1206 02/12/15 1712 02/12/15 2119 02/13/15 0744  GLUCAP 143* 149* 149* 163* 147*     IMAGING STUDIES Dg Chest Port 1 View  02/10/2015   CLINICAL DATA:  Emesis.  Short of breath with  nausea and vomiting.  EXAM: PORTABLE CHEST - 1 VIEW  COMPARISON:  11/11/2012.  FINDINGS: Exam is under penetrated and there is overlapping soft tissue in the central chest from the breasts. No gross consolidation is identified. The cardiopericardial silhouette appears within normal limits. No gross free air underneath the hemidiaphragms.  IMPRESSION: 1. Moderately technically degraded study. 2. No gross acute cardiopulmonary disease. Consider departmental repeat PA and lateral chest when patient condition permits.   Electronically Signed   By: Andreas Newport M.D.   On: 02/10/2015 20:02    DISCHARGE EXAMINATION: Filed Vitals:   02/12/15 0614 02/12/15 1433 02/12/15 2100 02/13/15 0551  BP: 110/60 115/66 112/58 123/58  Pulse: 77 80 85 81  Temp: 98.4 F (36.9 C) 98.1 F (36.7 C) 98 F (36.7 C) 98.2 F (36.8 C)  TempSrc: Oral Oral Oral Oral  Resp: 18 18 18 16   Height:      Weight:      SpO2: 94% 94% 95% 94%   General appearance: alert,  cooperative, appears stated age, no distress and moderately obese Resp: clear to auscultation bilaterally Cardio: regular rate and rhythm, S1, S2 normal, no murmur, click, rub or gallop GI: soft, non-tender; bowel sounds normal; no masses,  no organomegaly Extremities: extremities normal, atraumatic, no cyanosis or edema   DISPOSITION: Home with husband  Discharge Instructions    Call MD for:  difficulty breathing, headache or visual disturbances    Complete by:  As directed      Call MD for:  extreme fatigue    Complete by:  As directed      Call MD for:  persistant dizziness or light-headedness    Complete by:  As directed      Call MD for:  persistant nausea and vomiting    Complete by:  As directed      Call MD for:  severe uncontrolled pain    Complete by:  As directed      Call MD for:  temperature >100.4    Complete by:  As directed      Diet Carb Modified    Complete by:  As directed      Discharge instructions    Complete by:  As directed   Please follow up with you PCP next week. Seek attention if diarrhea worsens.  You were cared for by a hospitalist during your hospital stay. If you have any questions about your discharge medications or the care you received while you were in the hospital after you are discharged, you can call the unit and asked to speak with the hospitalist on call if the hospitalist that took care of you is not available. Once you are discharged, your primary care physician will handle any further medical issues. Please note that NO REFILLS for any discharge medications will be authorized once you are discharged, as it is imperative that you return to your primary care physician (or establish a relationship with a primary care physician if you do not have one) for your aftercare needs so that they can reassess your need for medications and monitor your lab values. If you do not have a primary care physician, you can call [phone] for a physician referral.       Increase activity slowly    Complete by:  As directed            ALLERGIES:  Allergies  Allergen Reactions  . Fish Allergy Anaphylaxis  . Gabapentin Other (See Comments)    Anxiety  on high doses  . Peanut-Containing Drug Products Shortness Of Breath  . Atrovent Other (See Comments)    Wheezing becomes worse  . Crestor [Rosuvastatin] Other (See Comments)    Extreme joint pain, to this & other statins  . Ipratropium-Albuterol Other (See Comments)    Wheezing becomes worse, can take albuterol with ipratropium  . Penicillins Other (See Comments)    Unknown childhood reaction      Discharge Medication List as of 02/13/2015  9:42 AM    START taking these medications   Details  saccharomyces boulardii (FLORASTOR) 250 MG capsule Take 1 capsule (250 mg total) by mouth 2 (two) times daily., Starting 02/13/2015, Until Discontinued, Print    vancomycin (VANCOCIN) 125 MG capsule Take 1 capsule (125 mg total) by mouth 4 (four) times daily. For 13 more days, Starting 02/13/2015, Until Discontinued, Print      CONTINUE these medications which have CHANGED   Details  metoCLOPramide (REGLAN) 5 MG tablet Take 1 tablet (5 mg total) by mouth 4 (four) times daily -  before meals and at bedtime., Starting 02/13/2015, Until Discontinued, Print      CONTINUE these medications which have NOT CHANGED   Details  Alpha Lipoic Acid 200 MG CAPS Take 600 mg by mouth 3 (three) times daily., Until Discontinued, Historical Med    aspirin EC 81 MG tablet Take 81 mg by mouth daily., Until Discontinued, Historical Med    b complex vitamins tablet Take 1 tablet by mouth daily., Until Discontinued, Historical Med    betamethasone dipropionate (DIPROLENE) 0.05 % cream Apply 1 application topically daily as needed (eczema)., Until Discontinued, Historical Med    cetirizine (ZYRTEC) 10 MG tablet Take 10 mg by mouth daily as needed for allergies., Until Discontinued, Historical Med    clotrimazole-betamethasone  (LOTRISONE) cream Apply 1 application topically daily as needed (eczema)., Until Discontinued, Historical Med    diphenhydrAMINE (BENADRYL) 25 MG tablet Take 1 tablet (25 mg total) by mouth every 6 (six) hours as needed for itching or allergies., Starting 04/05/2013, Until Discontinued, Print    enalapril (VASOTEC) 10 MG tablet Take 10 mg by mouth 2 (two) times daily., Until Discontinued, Historical Med    Estrogens, Conjugated (PREMARIN VA) Place 1 application vaginally 3 (three) times a week. On Saturdays, Mondays, and Wednesday (cream), Until Discontinued, Historical Med    fentaNYL (DURAGESIC - DOSED MCG/HR) 100 MCG/HR Place 1 patch onto the skin every 3 (three) days.  , Until Discontinued, Historical Med    Fluocinolone Acetonide 0.01 % OIL Place 1 drop in ear(s) 2 (two) times daily., Until Discontinued, Historical Med    fluticasone (FLONASE) 50 MCG/ACT nasal spray Place 2 sprays into the nose 2 (two) times daily.  , Until Discontinued, Historical Med    furosemide (LASIX) 20 MG tablet Take 20 mg by mouth 3 (three) times daily as needed for fluid., Until Discontinued, Historical Med    gabapentin (NEURONTIN) 100 MG capsule Take 400-700 mg by mouth See admin instructions. Pt takes 400mg  in the morning, 400mg  at lunchtime and 700mg  in the evening, Until Discontinued, Historical Med    GLUCOMANNAN PO Take 700 mg by mouth 2 (two) times daily., Until Discontinued, Historical Med    Guaifenesin (MUCINEX MAXIMUM STRENGTH) 1200 MG TB12 Take 1,200 mg by mouth 2 (two) times daily., Until Discontinued, Historical Med    hydroxychloroquine (PLAQUENIL) 200 MG tablet Take 200 mg by mouth 2 (two) times daily., Until Discontinued, Historical Med    insulin regular human  CONCENTRATED (HUMULIN R) 500 UNIT/ML SOLN injection Inject 10-40 Units into the skin 3 (three) times daily with meals. Based on sliding scale, Until Discontinued, Historical Med    levalbuterol (XOPENEX) 1.25 MG/0.5ML nebulizer  solution Take 1 ampule by nebulization 4 (four) times daily as needed for wheezing or shortness of breath., Until Discontinued, Historical Med    levothyroxine (SYNTHROID, LEVOTHROID) 200 MCG tablet Take 200 mcg by mouth daily. Take with 2 25 mcg tablets for a 250 mcg dose, Until Discontinued, Historical Med    magnesium oxide (MAG-OX) 400 MG tablet Take 400 mg by mouth 3 (three) times daily., Until Discontinued, Historical Med    metFORMIN (GLUCOPHAGE) 1000 MG tablet Take 1,000 mg by mouth 2 (two) times daily with a meal.  , Until Discontinued, Historical Med    milk thistle 175 MG tablet Take 1,000 mg by mouth 2 (two) times daily., Until Discontinued, Historical Med    Multiple Vitamin (MULTIVITAMIN) tablet Take 1 tablet by mouth daily., Until Discontinued, Historical Med    mupirocin cream (BACTROBAN) 2 % Apply 1 application topically daily as needed (eczema)., Until Discontinued, Historical Med    niacin (NIASPAN) 500 MG CR tablet Take 1,000 mg by mouth at bedtime.  , Until Discontinued, Historical Med    Omega-3 Fatty Acids (FISH OIL) 1000 MG CPDR Take 1 capsule by mouth daily., Until Discontinued, Historical Med    ondansetron (ZOFRAN ODT) 4 MG disintegrating tablet 4mg  ODT q4 hours prn nausea/vomit, Print    Oxycodone HCl 10 MG TABS Take 10 mg by mouth every 4 (four) hours as needed (for pain)., Until Discontinued, Historical Med    oxymetazoline (AFRIN) 0.05 % nasal spray Place 1 spray into both nostrils 2 (two) times daily as needed for congestion., Until Discontinued, Historical Med    Polyethyl Glycol-Propyl Glycol (SYSTANE ULTRA OP) Apply 1 drop to eye daily as needed (dry eyes)., Until Discontinued, Historical Med    promethazine (PHENERGAN) 25 MG tablet Take 25 mg by mouth every 6 (six) hours as needed for nausea or vomiting., Until Discontinued, Historical Med    quiNINE (QUALAQUIN) 324 MG capsule Take 648 mg by mouth daily as needed (inflammation)., Until Discontinued,  Historical Med    vitamin A 68341 UNIT capsule Take 10,000 Units by mouth daily., Until Discontinued, Historical Med    vitamin B-12 (CYANOCOBALAMIN) 1000 MCG tablet Take 1,000 mcg by mouth daily.  , Until Discontinued, Historical Med    Vitamins-Lipotropics (LIPO-FLAVONOID PLUS PO) Take 1 tablet by mouth 3 (three) times daily., Until Discontinued, Historical Med    albuterol (PROVENTIL HFA;VENTOLIN HFA) 108 (90 BASE) MCG/ACT inhaler Inhale 2 puffs into the lungs every 4 (four) hours as needed for wheezing or shortness of breath., Starting 11/14/2012, Until Discontinued, Print      STOP taking these medications     LORazepam (ATIVAN) 1 MG tablet      sucralfate (CARAFATE) 1 G tablet        Follow-up Information    Follow up with [facility], MD. Schedule an appointment as soon as possible for a visit in 1 week.   Specialty:  Family Medicine   Why:  post hospitalization follow up   Contact information:   [address] [facility] [address] [phone]       TOTAL DISCHARGE TIME: 35 minutes  [facility]  [facility] Pager [phone]  02/13/2015, 2:18 PM

## 2015-02-13 NOTE — Progress Notes (Signed)
Pt and spouse received written discharge and education orders. IV dc'd, Telemetry dc'd. Vitals stable. RX's x 3 given to pt. All questions addressed. Pt discharging  to home.  02/13/2015 10:05 AM Lauramae Kneisley

## 2015-02-13 NOTE — Discharge Instructions (Signed)
Clostridium Difficile Infection °Clostridium difficile (C. difficile) is a bacteria found in the intestinal tract or colon. Under certain conditions, it causes diarrhea and sometimes severe disease. The severe form of the disease is known as pseudomembranous colitis (often called C. difficile colitis). This disease can damage the lining of the colon or cause the colon to become enlarged (toxic megacolon). °CAUSES °Your colon normally contains many different bacteria, including C. difficile. The balance of bacteria in your colon can change during illness. This is especially true when you take antibiotic medicine. Taking antibiotics may allow the C. difficile to grow, multiply excessively, and make a toxin that then causes illness. The elderly and people with certain medical conditions have a greater risk of getting C. difficile infections. °SYMPTOMS °· Watery diarrhea. °· Fever. °· Fatigue. °· Loss of appetite. °· Nausea. °· Abdominal swelling, pain, or tenderness. °· Dehydration. °DIAGNOSIS °Your symptoms may make your caregiver suspect a C. difficile infection, especially if you have used antibiotics in the preceding weeks. However, there are only 2 ways to know for certain whether you have a C. difficile infection: °· A lab test that finds the toxin in your stool. °· The specific appearance of an abnormality (pseudomembrane) in your colon. This can only be seen by doing a sigmoidoscopy or colonoscopy. These procedures involve passing an instrument through your rectum to look at the inside of your colon. °Your caregiver will help determine if these tests are necessary. °TREATMENT °· Most people are successfully treated with one of two specific antibiotics, usually given by mouth. Other antibiotics you are receiving are stopped if possible. °· Intravenous (IV) fluids and correction of electrolyte imbalance may be necessary. °· Rarely, surgery may be needed to remove the infected part of the intestines. °· Careful  hand washing by you and your caregivers is important to prevent the spread of infection. In the hospital, your caregivers may also put on gowns and gloves to prevent the spread of the C. difficile bacteria. Your room is also cleaned regularly with a solution containing bleach or a product that is known to kill C. difficile. °HOME CARE INSTRUCTIONS °· Drink enough fluids to keep your urine clear or pale yellow. Avoid milk, caffeine, and alcohol. °· Ask your caregiver for specific rehydration instructions. °· Try eating small, frequent meals rather than large meals. °· Take your antibiotics as directed. Finish them even if you start to feel better. °· Do not use medicines to slow diarrhea. This could delay healing or cause complications. °· Wash your hands thoroughly after using the bathroom and before preparing food. °· Make sure people who live with you wash their hands often, too. °· Carefully disinfect all surfaces with a product that contains chlorine bleach. °SEEK MEDICAL CARE IF: °· Diarrhea persists longer than expected or recurs after completing your course of antibiotic treatment for the C. difficile infection. °· You have trouble staying hydrated. °SEEK IMMEDIATE MEDICAL CARE IF: °· You develop a new fever. °· You have increasing abdominal pain or tenderness. °· There is blood in your stools, or your stools are dark black and tarry. °· You cannot hold down food or liquids. °MAKE SURE YOU: °· Understand these instructions. °· Will watch your condition. °· Will get help right away if you are not doing well or get worse. °Document Released: 06/07/2005 Document Revised: 01/12/2014 Document Reviewed: 02/03/2011 °[employer]® Patient Information ©2015 [employer]. This information is not intended to replace advice given to you by your health care provider. Make sure you   discuss any questions you have with your health care provider. ° °

## 2015-02-16 LAB — GI PATHOGEN PANEL BY PCR, STOOL
Campylobacter by PCR: NOT DETECTED
Cryptosporidium by PCR: NOT DETECTED
E coli (ETEC) LT/ST: NOT DETECTED
E coli (STEC): NOT DETECTED
E coli 0157 by PCR: NOT DETECTED
G lamblia by PCR: NOT DETECTED
Norovirus GI/GII: NOT DETECTED
Rotavirus A by PCR: NOT DETECTED
Salmonella by PCR: NOT DETECTED
Shigella by PCR: NOT DETECTED

## 2015-02-16 LAB — CULTURE, BLOOD (ROUTINE X 2)
Culture: NO GROWTH
Culture: NO GROWTH

## 2015-03-02 DIAGNOSIS — A0472 Enterocolitis due to Clostridium difficile, not specified as recurrent: Secondary | ICD-10-CM | POA: Insufficient documentation

## 2015-07-06 ENCOUNTER — Encounter: Payer: Self-pay | Admitting: Internal Medicine

## 2015-07-06 ENCOUNTER — Ambulatory Visit (INDEPENDENT_AMBULATORY_CARE_PROVIDER_SITE_OTHER): Payer: BLUE CROSS/BLUE SHIELD | Admitting: Internal Medicine

## 2015-07-06 VITALS — BP 143/80 | HR 102 | Temp 97.5°F | Wt 276.0 lb

## 2015-07-06 DIAGNOSIS — Z880 Allergy status to penicillin: Secondary | ICD-10-CM | POA: Diagnosis not present

## 2015-07-06 DIAGNOSIS — R197 Diarrhea, unspecified: Secondary | ICD-10-CM | POA: Diagnosis not present

## 2015-07-06 DIAGNOSIS — Z8719 Personal history of other diseases of the digestive system: Secondary | ICD-10-CM

## 2015-07-06 DIAGNOSIS — IMO0001 Reserved for inherently not codable concepts without codable children: Secondary | ICD-10-CM

## 2015-07-06 NOTE — Progress Notes (Signed)
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   MRN: [id number]  HPI  54yo F with multiple medical problems. In June 2016, had first episode treated with 14 days of oral vancomycin. Did well for 1 week. diarrhea worsened, her pcp placed her on an additional 2 week course of vancomycin, without testing. No further treatment. Has been taking probiotics 2 caps 2 times/a day   She reports having diarrhea again the first of September. She approached her PCP, and then was referred to ID clinic  No further abtx since April.   Generally, has loose stools 3-4 x per day.   She had episode in September, where she has had frequent watery diarrhea. 4-5 days that it lasted and it resolved on its own.I noticed some bright red blood that she thought was from hemorrhoids.  Last seen by Dr. Jasmine December, GI the patient has history of polyps.   Allergies  Allergen Reactions  . Fish Allergy Anaphylaxis  . Gabapentin Other (See Comments)    Anxiety on high doses  . Peanut-Containing Drug Products Shortness Of Breath  . Atrovent Other (See Comments)    Wheezing becomes worse  . Crestor [Rosuvastatin] Other (See Comments)    Extreme joint pain, to this & other statins  . Ipratropium-Albuterol Other (See Comments)    Wheezing becomes worse, can take albuterol with ipratropium  . Penicillins Other (See Comments)    Unknown childhood reaction    Current Outpatient Prescriptions on File Prior to Visit  Medication Sig Dispense Refill  . albuterol (PROVENTIL HFA;VENTOLIN HFA) 108 (90 BASE) MCG/ACT inhaler Inhale 2 puffs into the lungs every 4 (four) hours as needed for wheezing or shortness of breath. 1 Inhaler 1  . Alpha Lipoic Acid 200 MG CAPS Take 600 mg by mouth 3 (three) times daily.    Marland Kitchen aspirin EC 81 MG tablet Take 81 mg by mouth daily.    Marland Kitchen b complex vitamins tablet Take 1 tablet by mouth daily.    .  betamethasone dipropionate (DIPROLENE) 0.05 % cream Apply 1 application topically daily as needed (eczema).    . cetirizine (ZYRTEC) 10 MG tablet Take 10 mg by mouth daily as needed for allergies.    . clotrimazole-betamethasone (LOTRISONE) cream Apply 1 application topically daily as needed (eczema).    . diphenhydrAMINE (BENADRYL) 25 MG tablet Take 1 tablet (25 mg total) by mouth every 6 (six) hours as needed for itching or allergies. 10 tablet 0  . enalapril (VASOTEC) 10 MG tablet Take 10 mg by mouth 2 (two) times daily.    . Estrogens, Conjugated (PREMARIN VA) Place 1 application vaginally 3 (three) times a week. On Saturdays, Mondays, and Wednesday (cream)    . fentaNYL (DURAGESIC - DOSED MCG/HR) 100 MCG/HR Place 1 patch onto the skin every 3 (three) days.      . Fluocinolone Acetonide 0.01 % OIL Place 1 drop in ear(s) 2 (two) times daily.    . fluticasone (FLONASE) 50 MCG/ACT nasal spray Place 2 sprays into the nose 2 (two) times daily.      . furosemide (LASIX) 20 MG tablet Take 20 mg by mouth 3 (three) times daily as needed for fluid.    Marland Kitchen gabapentin (NEURONTIN) 100 MG capsule Take 400-700 mg by mouth See admin instructions. Pt takes 400mg  in the morning, 400mg  at lunchtime and 700mg  in the evening    . GLUCOMANNAN PO Take  700 mg by mouth 2 (two) times daily.    . Guaifenesin (MUCINEX MAXIMUM STRENGTH) 1200 MG TB12 Take 1,200 mg by mouth 2 (two) times daily.    . hydroxychloroquine (PLAQUENIL) 200 MG tablet Take 200 mg by mouth 2 (two) times daily.    . insulin regular human CONCENTRATED (HUMULIN R) 500 UNIT/ML SOLN injection Inject 10-40 Units into the skin 3 (three) times daily with meals. Based on sliding scale    . levalbuterol (XOPENEX) 1.25 MG/0.5ML nebulizer solution Take 1 ampule by nebulization 4 (four) times daily as needed for wheezing or shortness of breath.    . levothyroxine (SYNTHROID, LEVOTHROID) 200 MCG tablet Take 200 mcg by mouth daily. Take with 2 25 mcg tablets for a 250  mcg dose    . magnesium oxide (MAG-OX) 400 MG tablet Take 400 mg by mouth 3 (three) times daily.    . metFORMIN (GLUCOPHAGE) 1000 MG tablet Take 1,000 mg by mouth 2 (two) times daily with a meal.      . metoCLOPramide (REGLAN) 5 MG tablet Take 1 tablet (5 mg total) by mouth 4 (four) times daily -  before meals and at bedtime. 120 tablet 0  . milk thistle 175 MG tablet Take 1,000 mg by mouth 2 (two) times daily.    . Multiple Vitamin (MULTIVITAMIN) tablet Take 1 tablet by mouth daily.    . mupirocin cream (BACTROBAN) 2 % Apply 1 application topically daily as needed (eczema).    . niacin (NIASPAN) 500 MG CR tablet Take 1,000 mg by mouth at bedtime.      . Omega-3 Fatty Acids (FISH OIL) 1000 MG CPDR Take 1 capsule by mouth daily.    . ondansetron (ZOFRAN ODT) 4 MG disintegrating tablet 4mg  ODT q4 hours prn nausea/vomit 6 tablet 0  . Oxycodone HCl 10 MG TABS Take 10 mg by mouth every 4 (four) hours as needed (for pain).    oxymetazoline (AFRIN) 0.05 % nasal spray Place 1 spray into both nostrils 2 (two) times daily as needed for congestion.    Marland Kitchen Glycol-Propyl Glycol (SYSTANE ULTRA OP) Apply 1 drop to eye daily as needed (dry eyes).    . promethazine (PHENERGAN) 25 MG tablet Take 25 mg by mouth every 6 (six) hours as needed for nausea or vomiting.    . quiNINE (QUALAQUIN) 324 MG capsule Take 648 mg by mouth daily as needed (inflammation).    . saccharomyces boulardii (FLORASTOR) 250 MG capsule Take 1 capsule (250 mg total) by mouth 2 (two) times daily. 30 capsule 0  . vancomycin (VANCOCIN) 125 MG capsule Take 1 capsule (125 mg total) by mouth 4 (four) times daily. For 13 more days 52 capsule 0  . vitamin A Bertram Gala UNIT capsule Take 10,000 Units by mouth daily.    . vitamin B-12 (CYANOCOBALAMIN) 1000 MCG tablet Take 1,000 mcg by mouth daily.      . Vitamins-Lipotropics (LIPO-FLAVONOID PLUS PO) Take 1 tablet by mouth 3 (three) times daily.     No current facility-administered medications on  file prior to visit.   Active Ambulatory Problems    Diagnosis Date Noted  . Chest pain 11/11/2012  . Type II or unspecified type diabetes mellitus without mention of complication, not stated as uncontrolled 11/11/2012  . Essential hypertension, benign 11/11/2012  . Other and unspecified hyperlipidemia 11/11/2012  . Mixed connective tissue disease (HCC) 11/11/2012  . Hypothyroidism 11/11/2012  . Asthma 11/11/2012  . Nausea vomiting and diarrhea 02/10/2015  . Diabetes  mellitus without complication (HCC) 02/11/2015  . Leg edema 02/11/2015  . C. difficile diarrhea 02/12/2015   Resolved Ambulatory Problems    Diagnosis Date Noted  . No Resolved Ambulatory Problems   Past Medical History  Diagnosis Date  . Asthma   . Hypertension   . Diabetes mellitus   . IBS (irritable bowel syndrome)   . GERD (gastroesophageal reflux disease)   . Eczema   . Thyroid disease     Social History  Substance Use Topics  . Smoking status: Former Smoker    Quit date: 02/11/2004  . Smokeless tobacco: None  . Alcohol Use: Yes     Comment: once a month  family history includes CAD in her father and mother; Hypertension in her mother.  Review of Systems   Constitutional: Negative for fever, chills, diaphoresis, activity change, appetite change, fatigue and unexpected weight change.  HENT: Negative for congestion, sore throat, rhinorrhea, sneezing, trouble swallowing and sinus pressure.  Eyes: Negative for photophobia and visual disturbance.  Respiratory: Negative for cough, chest tightness, shortness of breath, wheezing and stridor.  Cardiovascular: Negative for chest pain, palpitations and leg swelling.  Gastrointestinal: Negative for nausea, vomiting, abdominal pain, diarrhea, constipation, blood in stool, abdominal distention and anal bleeding.  Genitourinary: Negative for dysuria, hematuria, flank pain and difficulty urinating.  Musculoskeletal: Negative for myalgias, back pain, joint swelling,  arthralgias and gait problem.  Skin: Negative for color change, pallor, rash and wound.  Neurological: Negative for dizziness, tremors, weakness and light-headedness.  Hematological: Negative for adenopathy. Does not bruise/bleed easily.  Psychiatric/Behavioral: Negative for behavioral problems, confusion, sleep disturbance, dysphoric mood, decreased concentration and agitation.       Objective:   Physical Exam BP 143/80 mmHg  Pulse 102  Temp(Src) 97.5 F (36.4 C) (Oral)  Wt 276 lb (125.193 kg) Physical Exam  Constitutional:  oriented to person, place, and time. appears well-developed and well-nourished. No distress.  HENT: [place]/AT, PERRLA, no scleral icterus Mouth/Throat: Oropharynx is clear and moist. No oropharyngeal exudate.  Cardiovascular: Normal rate, regular rhythm and normal heart sounds. Exam reveals no gallop and no friction rub.  No murmur heard.  Pulmonary/Chest: Effort normal and breath sounds normal. No respiratory distress.  has no wheezes.  Neck = supple, no nuchal rigidity Abdominal: Soft. Bowel sounds are normal.  exhibits no distension. There is no tenderness.  Lymphadenopathy: no cervical adenopathy. No axillary adenopathy Neurological: alert and oriented to person, place, and time.  Skin: Skin is warm and dry. No rash noted. No erythema.  Psychiatric: a normal mood and affect.  behavior is normal.         Assessment & Plan:   protracted c.difficile = finished 2 courses of oral vanco back in June-July. Can continue with probiotics  Penicillin allergy = refer to allergist to skin testing  ibs = recent labs reveal that she is c.difficile negative. 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 Will do a trial of bentyl. Can also do immodium to see if that helps her symptoms. If she has episode of c.difficile, recommend for her to come back to evaluate for FMT. At this time, it appears that she has only had 1 protracted course.  rtc prn

## 2015-07-14 ENCOUNTER — Emergency Department (HOSPITAL_BASED_OUTPATIENT_CLINIC_OR_DEPARTMENT_OTHER)
Admission: EM | Admit: 2015-07-14 | Discharge: 2015-07-14 | Disposition: A | Discharge status: Home / Self Care | Payer: BLUE CROSS/BLUE SHIELD | Attending: Physician Assistant | Admitting: Physician Assistant

## 2015-07-14 ENCOUNTER — Encounter (HOSPITAL_BASED_OUTPATIENT_CLINIC_OR_DEPARTMENT_OTHER): Payer: Self-pay

## 2015-07-14 ENCOUNTER — Emergency Department (HOSPITAL_BASED_OUTPATIENT_CLINIC_OR_DEPARTMENT_OTHER): Payer: BLUE CROSS/BLUE SHIELD

## 2015-07-14 DIAGNOSIS — Z880 Allergy status to penicillin: Secondary | ICD-10-CM | POA: Insufficient documentation

## 2015-07-14 DIAGNOSIS — Z8719 Personal history of other diseases of the digestive system: Secondary | ICD-10-CM | POA: Diagnosis not present

## 2015-07-14 DIAGNOSIS — R0602 Shortness of breath: Secondary | ICD-10-CM | POA: Diagnosis present

## 2015-07-14 DIAGNOSIS — Z7951 Long term (current) use of inhaled steroids: Secondary | ICD-10-CM | POA: Insufficient documentation

## 2015-07-14 DIAGNOSIS — E119 Type 2 diabetes mellitus without complications: Secondary | ICD-10-CM | POA: Insufficient documentation

## 2015-07-14 DIAGNOSIS — E079 Disorder of thyroid, unspecified: Secondary | ICD-10-CM | POA: Insufficient documentation

## 2015-07-14 DIAGNOSIS — Z79899 Other long term (current) drug therapy: Secondary | ICD-10-CM | POA: Insufficient documentation

## 2015-07-14 DIAGNOSIS — Z7982 Long term (current) use of aspirin: Secondary | ICD-10-CM | POA: Diagnosis not present

## 2015-07-14 DIAGNOSIS — Z794 Long term (current) use of insulin: Secondary | ICD-10-CM | POA: Insufficient documentation

## 2015-07-14 DIAGNOSIS — R059 Cough, unspecified: Secondary | ICD-10-CM

## 2015-07-14 DIAGNOSIS — I10 Essential (primary) hypertension: Secondary | ICD-10-CM | POA: Diagnosis not present

## 2015-07-14 DIAGNOSIS — Z8739 Personal history of other diseases of the musculoskeletal system and connective tissue: Secondary | ICD-10-CM | POA: Diagnosis not present

## 2015-07-14 DIAGNOSIS — Z872 Personal history of diseases of the skin and subcutaneous tissue: Secondary | ICD-10-CM | POA: Insufficient documentation

## 2015-07-14 DIAGNOSIS — J45901 Unspecified asthma with (acute) exacerbation: Secondary | ICD-10-CM | POA: Diagnosis not present

## 2015-07-14 DIAGNOSIS — Z87891 Personal history of nicotine dependence: Secondary | ICD-10-CM | POA: Diagnosis not present

## 2015-07-14 DIAGNOSIS — R05 Cough: Secondary | ICD-10-CM

## 2015-07-14 NOTE — ED Provider Notes (Signed)
CSN: [id number]     Arrival date & time 07/14/15  0945 History   First MD Initiated Contact with Patient 07/14/15 [phone]     Chief Complaint  Patient presents with  . Shortness of Breath     (Consider location/radiation/quality/duration/timing/severity/associated sxs/prior Treatment) HPI   Patient is a morbidly obese 54 year old female with history of diabetes IBS GERD and hypertension presenting today with cough. Patient reports she was eating a boiled egg this morning. She put lots of peppers on it she brought it close to her mouth and then inhaled rather than eating. She feels like she got pepper her lungs. She called [employer] [facility] and was be told to come here immediately. Patient feels mildly short of breath.    Past Medical History  Diagnosis Date  . Asthma   . Hypertension   . Diabetes mellitus   . IBS (irritable bowel syndrome)   . GERD (gastroesophageal reflux disease)   . Eczema   . Thyroid disease   . Mixed connective tissue disease [facility])    Past Surgical History  Procedure Laterality Date  . Breast surgery    . Cholecystectomy    . Abdominal hysterectomy    . Appendectomy    . Leg surgery Left     muscle biospy  . Wrist surgery Left    Family History  Problem Relation Age of Onset  . CAD Mother   . Hypertension Mother   . CAD Father    Social History  Substance Use Topics  . Smoking status: Former Smoker    Quit date: 02/11/2004  . Smokeless tobacco: None  . Alcohol Use: Yes     Comment: once a month   OB History    No data available     Review of Systems  Constitutional: Negative for fever and activity change.  Respiratory: Positive for cough and shortness of breath.   Cardiovascular: Negative for chest pain.  Gastrointestinal: Negative for abdominal pain.  Neurological: Negative for dizziness.      Allergies  Fish allergy; Gabapentin; Peanut-containing drug products; Atrovent; Crestor; Ipratropium-albuterol; and Penicillins  Home  Medications   Prior to Admission medications   Medication Sig Start Date End Date Taking? Authorizing Provider  albuterol (PROVENTIL HFA;VENTOLIN HFA) 108 (90 BASE) MCG/ACT inhaler Inhale 2 puffs into the lungs every 4 (four) hours as needed for wheezing or shortness of breath. 11/14/12   Richarda Overlie, MD  Alpha Lipoic Acid 200 MG CAPS Take 600 mg by mouth 3 (three) times daily.    Historical Provider, MD  aspirin EC 81 MG tablet Take 81 mg by mouth daily.    Historical Provider, MD  b complex vitamins tablet Take 1 tablet by mouth daily.    Historical Provider, MD  betamethasone dipropionate (DIPROLENE) 0.05 % cream Apply 1 application topically daily as needed (eczema).    Historical Provider, MD  cetirizine (ZYRTEC) 10 MG tablet Take 10 mg by mouth daily as needed for allergies.    Historical Provider, MD  clotrimazole-betamethasone (LOTRISONE) cream Apply 1 application topically daily as needed (eczema).    Historical Provider, MD  diphenhydrAMINE (BENADRYL) 25 MG tablet Take 1 tablet (25 mg total) by mouth every 6 (six) hours as needed for itching or allergies. 04/05/13   Fayrene Helper, PA-C  enalapril (VASOTEC) 10 MG tablet Take 10 mg by mouth 2 (two) times daily.    Historical Provider, MD  Estrogens, Conjugated (PREMARIN VA) Place 1 application vaginally 3 (three) times a week. On  Saturdays, Mondays, and Wednesday (cream)    Historical Provider, MD  fentaNYL (DURAGESIC - DOSED MCG/HR) 100 MCG/HR Place 1 patch onto the skin every 3 (three) days.      Historical Provider, MD  Fluocinolone Acetonide 0.01 % OIL Place 1 drop in ear(s) 2 (two) times daily.    Historical Provider, MD  fluticasone (FLONASE) 50 MCG/ACT nasal spray Place 2 sprays into the nose 2 (two) times daily.      Historical Provider, MD  furosemide (LASIX) 20 MG tablet Take 20 mg by mouth 3 (three) times daily as needed for fluid.    Historical Provider, MD  gabapentin (NEURONTIN) 100 MG capsule Take 400-700 mg by mouth See admin  instructions. Pt takes 400mg  in the morning, 400mg  at lunchtime and 700mg  in the evening    Historical Provider, MD  GLUCOMANNAN PO Take 700 mg by mouth 2 (two) times daily.    Historical Provider, MD  Guaifenesin (MUCINEX MAXIMUM STRENGTH) 1200 MG TB12 Take 1,200 mg by mouth 2 (two) times daily.    Historical Provider, MD  hydroxychloroquine (PLAQUENIL) 200 MG tablet Take 200 mg by mouth 2 (two) times daily.    Historical Provider, MD  insulin regular human CONCENTRATED (HUMULIN R) 500 UNIT/ML SOLN injection Inject 10-40 Units into the skin 3 (three) times daily with meals. Based on sliding scale    Historical Provider, MD  levalbuterol (XOPENEX) 1.25 MG/0.5ML nebulizer solution Take 1 ampule by nebulization 4 (four) times daily as needed for wheezing or shortness of breath.    Historical Provider, MD  levothyroxine (SYNTHROID, LEVOTHROID) 200 MCG tablet Take 200 mcg by mouth daily. Take with 2 25 mcg tablets for a 250 mcg dose    Historical Provider, MD  magnesium oxide (MAG-OX) 400 MG tablet Take 400 mg by mouth 3 (three) times daily.    Historical Provider, MD  metFORMIN (GLUCOPHAGE) 1000 MG tablet Take 1,000 mg by mouth 2 (two) times daily with a meal.      Historical Provider, MD  metoCLOPramide (REGLAN) 5 MG tablet Take 1 tablet (5 mg total) by mouth 4 (four) times daily -  before meals and at bedtime. 02/13/15   , MD  milk thistle 175 MG tablet Take 1,000 mg by mouth 2 (two) times daily.    Historical Provider, MD  Multiple Vitamin (MULTIVITAMIN) tablet Take 1 tablet by mouth daily.    Historical Provider, MD  mupirocin cream (BACTROBAN) 2 % Apply 1 application topically daily as needed (eczema).    Historical Provider, MD  niacin (NIASPAN) 500 MG CR tablet Take 1,000 mg by mouth at bedtime.      Historical Provider, MD  Omega-3 Fatty Acids (FISH OIL) 1000 MG CPDR Take 1 capsule by mouth daily.    Historical Provider, MD  ondansetron (ZOFRAN ODT) 4 MG disintegrating tablet 4mg  ODT  q4 hours prn nausea/vomit 08/01/13   04/15/15, PA-C  Oxycodone HCl 10 MG TABS Take 10 mg by mouth every 4 (four) hours as needed (for pain).    Historical Provider, MD  oxymetazoline (AFRIN) 0.05 % nasal spray Place 1 spray into both nostrils 2 (two) times daily as needed for congestion.    Historical Provider, MD  Polyethyl Glycol-Propyl Glycol (SYSTANE ULTRA OP) Apply 1 drop to eye daily as needed (dry eyes).    Historical Provider, MD  promethazine (PHENERGAN) 25 MG tablet Take 25 mg by mouth every 6 (six) hours as needed for nausea or vomiting.    Historical Provider,  MD  quiNINE (QUALAQUIN) 324 MG capsule Take 648 mg by mouth daily as needed (inflammation).    Historical Provider, MD  saccharomyces boulardii (FLORASTOR) 250 MG capsule Take 1 capsule (250 mg total) by mouth 2 (two) times daily. 02/13/15   Osvaldo Shipper, MD  vancomycin (VANCOCIN) 125 MG capsule Take 1 capsule (125 mg total) by mouth 4 (four) times daily. For 13 more days 02/13/15   Osvaldo Shipper, MD  vitamin A 92330 UNIT capsule Take 10,000 Units by mouth daily.    Historical Provider, MD  vitamin B-12 (CYANOCOBALAMIN) 1000 MCG tablet Take 1,000 mcg by mouth daily.      Historical Provider, MD  Vitamins-Lipotropics (LIPO-FLAVONOID PLUS PO) Take 1 tablet by mouth 3 (three) times daily.    Historical Provider, MD   BP 155/92 mmHg  Pulse 90  Temp(Src) 98 F (36.7 C) (Oral)  Resp 20  Ht 5\' 2"  (1.575 m)  Wt 275 lb (124.739 kg)  BMI 50.29 kg/m2  SpO2 98% Physical Exam  Constitutional: She is oriented to person, place, and time. She appears well-developed and well-nourished.  HENT:  Head: Normocephalic and atraumatic.  Eyes: Right eye exhibits no discharge.  Cardiovascular: Normal rate, regular rhythm and normal heart sounds.   No murmur heard. Pulmonary/Chest: Effort normal and breath sounds normal. No respiratory distress. She has no wheezes. She has no rales.  Neurological: She is oriented to person, place, and time.   Skin: Skin is warm and dry. She is not diaphoretic.  Psychiatric: She has a normal mood and affect.  Nursing note and vitals reviewed.   ED Course  Procedures (including critical care time) Labs Review Labs Reviewed - No data to display  Imaging Review No results found. I have personally reviewed and evaluated these images and lab results as part of my medical decision-making.   EKG Interpretation None      MDM   Final diagnoses:  None    Patient is a 54 year old female presenting with cough after inhaling pepper. I doubt  this represents any kind of serious danger at this time. Patient could develop pneumonitis however it is too early to tell. Would not treat prophylactically with antibiotics. We will get chest x-ray right now to make sure there is no evidence of current pneumonitis. Otherwise we'll have patient watch and wait at home. We'll have her return if she is having any difficulty breathing. Patient has albuterol at home that she uses anyway. We will recommend her use it as usual.  Patient's lungs are perfectly clear, she is nontachypneic and she appears no acute distress at baseline.  Janiene Aarons Randall An, MD 07/14/15 1053

## 2015-07-14 NOTE — Discharge Instructions (Signed)
If you have any increase in cough or shortness of breath. Please return immediately for a repeat chest x-ray.

## 2015-07-14 NOTE — ED Notes (Signed)
Pt. returned from XR. 

## 2015-07-14 NOTE — ED Notes (Signed)
Pt reports "inhaling pepper into lungs" while eating an egg this morning. Reports SHOB since then. Pts lungs clear. Sts hx of asthma

## 2015-07-16 ENCOUNTER — Telehealth: Payer: Self-pay | Admitting: *Deleted

## 2015-07-26 NOTE — Telephone Encounter (Signed)
Per patient request faxed the office note to her PCP at [facility]

## 2015-08-23 DIAGNOSIS — R072 Precordial pain: Secondary | ICD-10-CM | POA: Insufficient documentation

## 2015-08-23 DIAGNOSIS — R002 Palpitations: Secondary | ICD-10-CM | POA: Insufficient documentation

## 2015-09-03 ENCOUNTER — Emergency Department (HOSPITAL_BASED_OUTPATIENT_CLINIC_OR_DEPARTMENT_OTHER): Payer: BLUE CROSS/BLUE SHIELD

## 2015-09-03 ENCOUNTER — Encounter (HOSPITAL_BASED_OUTPATIENT_CLINIC_OR_DEPARTMENT_OTHER): Payer: Self-pay | Admitting: Emergency Medicine

## 2015-09-03 ENCOUNTER — Emergency Department (HOSPITAL_BASED_OUTPATIENT_CLINIC_OR_DEPARTMENT_OTHER)
Admission: EM | Admit: 2015-09-03 | Discharge: 2015-09-03 | Disposition: A | Discharge status: Home / Self Care | Payer: BLUE CROSS/BLUE SHIELD | Attending: Emergency Medicine | Admitting: Emergency Medicine

## 2015-09-03 ENCOUNTER — Other Ambulatory Visit: Payer: Self-pay

## 2015-09-03 DIAGNOSIS — Z7951 Long term (current) use of inhaled steroids: Secondary | ICD-10-CM | POA: Diagnosis not present

## 2015-09-03 DIAGNOSIS — Z872 Personal history of diseases of the skin and subcutaneous tissue: Secondary | ICD-10-CM | POA: Insufficient documentation

## 2015-09-03 DIAGNOSIS — Z880 Allergy status to penicillin: Secondary | ICD-10-CM | POA: Insufficient documentation

## 2015-09-03 DIAGNOSIS — Z7982 Long term (current) use of aspirin: Secondary | ICD-10-CM | POA: Diagnosis not present

## 2015-09-03 DIAGNOSIS — Z7984 Long term (current) use of oral hypoglycemic drugs: Secondary | ICD-10-CM | POA: Insufficient documentation

## 2015-09-03 DIAGNOSIS — I10 Essential (primary) hypertension: Secondary | ICD-10-CM | POA: Diagnosis not present

## 2015-09-03 DIAGNOSIS — E079 Disorder of thyroid, unspecified: Secondary | ICD-10-CM | POA: Insufficient documentation

## 2015-09-03 DIAGNOSIS — M5412 Radiculopathy, cervical region: Secondary | ICD-10-CM

## 2015-09-03 DIAGNOSIS — E119 Type 2 diabetes mellitus without complications: Secondary | ICD-10-CM | POA: Insufficient documentation

## 2015-09-03 DIAGNOSIS — K219 Gastro-esophageal reflux disease without esophagitis: Secondary | ICD-10-CM | POA: Insufficient documentation

## 2015-09-03 DIAGNOSIS — R079 Chest pain, unspecified: Secondary | ICD-10-CM | POA: Diagnosis present

## 2015-09-03 DIAGNOSIS — Z79899 Other long term (current) drug therapy: Secondary | ICD-10-CM | POA: Insufficient documentation

## 2015-09-03 DIAGNOSIS — J45909 Unspecified asthma, uncomplicated: Secondary | ICD-10-CM | POA: Insufficient documentation

## 2015-09-03 DIAGNOSIS — Z794 Long term (current) use of insulin: Secondary | ICD-10-CM | POA: Diagnosis not present

## 2015-09-03 LAB — CBC
HCT: 44.2 % (ref 36.0–46.0)
Hemoglobin: 13.7 g/dL (ref 12.0–15.0)
MCH: 27.7 pg (ref 26.0–34.0)
MCHC: 31 g/dL (ref 30.0–36.0)
MCV: 89.5 fL (ref 78.0–100.0)
Platelets: 215 10*3/uL (ref 150–400)
RBC: 4.94 MIL/uL (ref 3.87–5.11)
RDW: 13.3 % (ref 11.5–15.5)
WBC: 8.8 10*3/uL (ref 4.0–10.5)

## 2015-09-03 LAB — BASIC METABOLIC PANEL
Anion gap: 6 (ref 5–15)
BUN: 13 mg/dL (ref 6–20)
CO2: 29 mmol/L (ref 22–32)
Calcium: 9.1 mg/dL (ref 8.9–10.3)
Chloride: 103 mmol/L (ref 101–111)
Creatinine, Ser: 1 mg/dL (ref 0.44–1.00)
GFR calc Af Amer: >60 mL/min (ref >60)
GFR calc non Af Amer: >60 mL/min (ref >60)
Glucose, Bld: 203 mg/dL — ABNORMAL HIGH (ref 65–99)
Potassium: 4.3 mmol/L (ref 3.5–5.1)
Sodium: 138 mmol/L (ref 135–145)

## 2015-09-03 LAB — TROPONIN I: Troponin I: <0.03 ng/mL (ref <0.031)

## 2015-09-03 NOTE — ED Provider Notes (Signed)
CSN: [id number]     Arrival date & time 09/03/15  0045 History   First MD Initiated Contact with Patient 09/03/15 0221     Chief Complaint  Patient presents with  . Chest Pain     (Consider location/radiation/quality/duration/timing/severity/associated sxs/prior Treatment) HPI  This is a 54 year old female with history of degenerative disc disease. She is here with a two-day history of pain in her left upper chest and back that radiates down her left arm and about the C8 dermatome. Pain is worse with movement notably rotation of the head. There are some paresthesias associated with the pain. She rates her pain as a 6 out of 10 with movement and minimal at rest. She is having shortness of breath consistent with her usual asthma. She sees a neurologist in [place] who has performed injections in her neck in the past.  Past Medical History  Diagnosis Date  . Asthma   . Hypertension   . Diabetes mellitus   . IBS (irritable bowel syndrome)   . GERD (gastroesophageal reflux disease)   . Eczema   . Thyroid disease   . Mixed connective tissue disease [facility])    Past Surgical History  Procedure Laterality Date  . Breast surgery    . Cholecystectomy    . Abdominal hysterectomy    . Appendectomy    . Leg surgery Left     muscle biospy  . Wrist surgery Left    Family History  Problem Relation Age of Onset  . CAD Mother   . Hypertension Mother   . CAD Father    Social History  Substance Use Topics  . Smoking status: Former Smoker    Quit date: 02/11/2004  . Smokeless tobacco: None  . Alcohol Use: Yes     Comment: once a month   OB History    No data available     Review of Systems  All other systems reviewed and are negative.   Allergies  Fish allergy; Gabapentin; Peanut-containing drug products; Atrovent; Crestor; Ipratropium-albuterol; and Penicillins  Home Medications   Prior to Admission medications   Medication Sig Start Date End Date Taking? Authorizing  Provider  albuterol (PROVENTIL HFA;VENTOLIN HFA) 108 (90 BASE) MCG/ACT inhaler Inhale 2 puffs into the lungs every 4 (four) hours as needed for wheezing or shortness of breath. 11/14/12   Richarda Overlie, MD  Alpha Lipoic Acid 200 MG CAPS Take 600 mg by mouth 3 (three) times daily.    Historical Provider, MD  aspirin EC 81 MG tablet Take 81 mg by mouth daily.    Historical Provider, MD  b complex vitamins tablet Take 1 tablet by mouth daily.    Historical Provider, MD  betamethasone dipropionate (DIPROLENE) 0.05 % cream Apply 1 application topically daily as needed (eczema).    Historical Provider, MD  cetirizine (ZYRTEC) 10 MG tablet Take 10 mg by mouth daily as needed for allergies.    Historical Provider, MD  clotrimazole-betamethasone (LOTRISONE) cream Apply 1 application topically daily as needed (eczema).    Historical Provider, MD  diphenhydrAMINE (BENADRYL) 25 MG tablet Take 1 tablet (25 mg total) by mouth every 6 (six) hours as needed for itching or allergies. 04/05/13   Fayrene Helper, PA-C  enalapril (VASOTEC) 10 MG tablet Take 10 mg by mouth 2 (two) times daily.    Historical Provider, MD  Estrogens, Conjugated (PREMARIN VA) Place 1 application vaginally 3 (three) times a week. On Saturdays, Mondays, and Wednesday (cream)    Historical Provider, MD  fentaNYL (DURAGESIC - DOSED MCG/HR) 100 MCG/HR Place 1 patch onto the skin every 3 (three) days.      Historical Provider, MD  Fluocinolone Acetonide 0.01 % OIL Place 1 drop in ear(s) 2 (two) times daily.    Historical Provider, MD  fluticasone (FLONASE) 50 MCG/ACT nasal spray Place 2 sprays into the nose 2 (two) times daily.      Historical Provider, MD  furosemide (LASIX) 20 MG tablet Take 20 mg by mouth 3 (three) times daily as needed for fluid.    Historical Provider, MD  gabapentin (NEURONTIN) 100 MG capsule Take 400-700 mg by mouth See admin instructions. Pt takes 400mg  in the morning, 400mg  at lunchtime and 700mg  in the evening    Historical  Provider, MD  GLUCOMANNAN PO Take 700 mg by mouth 2 (two) times daily.    Historical Provider, MD  Guaifenesin (MUCINEX MAXIMUM STRENGTH) 1200 MG TB12 Take 1,200 mg by mouth 2 (two) times daily.    Historical Provider, MD  hydroxychloroquine (PLAQUENIL) 200 MG tablet Take 200 mg by mouth 2 (two) times daily.    Historical Provider, MD  insulin regular human CONCENTRATED (HUMULIN R) 500 UNIT/ML SOLN injection Inject 10-40 Units into the skin 3 (three) times daily with meals. Based on sliding scale    Historical Provider, MD  levalbuterol (XOPENEX) 1.25 MG/0.5ML nebulizer solution Take 1 ampule by nebulization 4 (four) times daily as needed for wheezing or shortness of breath.    Historical Provider, MD  levothyroxine (SYNTHROID, LEVOTHROID) 200 MCG tablet Take 200 mcg by mouth daily. Take with 2 25 mcg tablets for a 250 mcg dose    Historical Provider, MD  magnesium oxide (MAG-OX) 400 MG tablet Take 400 mg by mouth 3 (three) times daily.    Historical Provider, MD  metFORMIN (GLUCOPHAGE) 1000 MG tablet Take 1,000 mg by mouth 2 (two) times daily with a meal.      Historical Provider, MD  metoCLOPramide (REGLAN) 5 MG tablet Take 1 tablet (5 mg total) by mouth 4 (four) times daily -  before meals and at bedtime. 02/13/15   , MD  milk thistle 175 MG tablet Take 1,000 mg by mouth 2 (two) times daily.    Historical Provider, MD  Multiple Vitamin (MULTIVITAMIN) tablet Take 1 tablet by mouth daily.    Historical Provider, MD  mupirocin cream (BACTROBAN) 2 % Apply 1 application topically daily as needed (eczema).    Historical Provider, MD  niacin (NIASPAN) 500 MG CR tablet Take 1,000 mg by mouth at bedtime.      Historical Provider, MD  Omega-3 Fatty Acids (FISH OIL) 1000 MG CPDR Take 1 capsule by mouth daily.    Historical Provider, MD  ondansetron (ZOFRAN ODT) 4 MG disintegrating tablet 4mg  ODT q4 hours prn nausea/vomit 08/01/13   04/15/15, PA-C  Oxycodone HCl 10 MG TABS Take 10 mg by  mouth every 4 (four) hours as needed (for pain).    Historical Provider, MD  oxymetazoline (AFRIN) 0.05 % nasal spray Place 1 spray into both nostrils 2 (two) times daily as needed for congestion.    Historical Provider, MD  Polyethyl Glycol-Propyl Glycol (SYSTANE ULTRA OP) Apply 1 drop to eye daily as needed (dry eyes).    Historical Provider, MD  promethazine (PHENERGAN) 25 MG tablet Take 25 mg by mouth every 6 (six) hours as needed for nausea or vomiting.    Historical Provider, MD  quiNINE (QUALAQUIN) 324 MG capsule Take 648 mg by mouth  daily as needed (inflammation).    Historical Provider, MD  saccharomyces boulardii (FLORASTOR) 250 MG capsule Take 1 capsule (250 mg total) by mouth 2 (two) times daily. 02/13/15   Osvaldo Shipper, MD  vancomycin (VANCOCIN) 125 MG capsule Take 1 capsule (125 mg total) by mouth 4 (four) times daily. For 13 more days 02/13/15   Osvaldo Shipper, MD  vitamin A 44010 UNIT capsule Take 10,000 Units by mouth daily.    Historical Provider, MD  vitamin B-12 (CYANOCOBALAMIN) 1000 MCG tablet Take 1,000 mcg by mouth daily.      Historical Provider, MD  Vitamins-Lipotropics (LIPO-FLAVONOID PLUS PO) Take 1 tablet by mouth 3 (three) times daily.    Historical Provider, MD   BP 133/82 mmHg  Pulse 83  Temp(Src) 98.4 F (36.9 C) (Oral)  Resp 15  Ht 5\' 2"  (1.575 m)  Wt 275 lb (124.739 kg)  BMI 50.29 kg/m2  SpO2 96%   Physical Exam General: Well-developed, obese female in no acute distress; appearance consistent with age of record HENT: normocephalic; atraumatic Eyes: pupils equal, round and reactive to light; extraocular muscles intact Neck: supple; rotation of the neck reproduces the pain of the chief complaint Heart: regular rate and rhythm; distant sounds Lungs: clear to auscultation bilaterally; distant sounds Abdomen: soft; nondistended; nontender; bowel sounds present Extremities: No deformity; full range of motion; pulses normal Neurologic: Awake, alert and oriented;  motor function intact in all extremities and symmetric; no facial droop Skin: Warm and dry Psychiatric: Normal mood and affect  ED Course  Procedures (including critical care time)   EKG Interpretation   Date/Time:  Friday September 03 2015 00:54:34 EST Ventricular Rate:  84 PR Interval:  188 QRS Duration: 86 QT Interval:  388 QTC Calculation: 458 R Axis:   -50 Text Interpretation:  Normal sinus rhythm Left axis deviation Low voltage  QRS Inferior infarct , age undetermined Cannot rule out Anterior infarct ,  age undetermined Abnormal ECG No significant change was found Confirmed by  02-13-1991  MD, Read Drivers (Jonny Ruiz) on 09/03/2015 1:14:08 AM      MDM   Nursing notes and vitals signs, including pulse oximetry, reviewed.  Summary of this visit's results, reviewed by myself:  Labs:  Results for orders placed or performed during the hospital encounter of 09/03/15 (from the past 24 hour(s))  Basic metabolic panel     Status: Abnormal   Collection Time: 09/03/15  1:05 AM  Result Value Ref Range   Sodium 138 135 - 145 mmol/L   Potassium 4.3 3.5 - 5.1 mmol/L   Chloride 103 101 - 111 mmol/L   CO2 29 22 - 32 mmol/L   Glucose, Bld 203 (H) 65 - 99 mg/dL   BUN 13 6 - 20 mg/dL   Creatinine, Ser 09/05/15 0.44 - 1.00 mg/dL   Calcium 9.1 8.9 - 6.64 mg/dL   GFR calc non Af Amer >60 >60 mL/min   GFR calc Af Amer >60 >60 mL/min   Anion gap 6 5 - 15  CBC     Status: None   Collection Time: 09/03/15  1:05 AM  Result Value Ref Range   WBC 8.8 4.0 - 10.5 K/uL   RBC 4.94 3.87 - 5.11 MIL/uL   Hemoglobin 13.7 12.0 - 15.0 g/dL   HCT 09/05/15 47.4 - 25.9 %   MCV 89.5 78.0 - 100.0 fL   MCH 27.7 26.0 - 34.0 pg   MCHC 31.0 30.0 - 36.0 g/dL   RDW 56.3 87.5 - 64.3 %  Platelets 215 150 - 400 K/uL  Troponin I     Status: None   Collection Time: 09/03/15  1:05 AM  Result Value Ref Range   Troponin I <0.03 <0.031 ng/mL    Imaging Studies: Dg Chest 2 View  09/03/2015  CLINICAL DATA:  Chest pain EXAM:  CHEST  2 VIEW COMPARISON:  07/14/2015 FINDINGS: Frontal imaging degraded by low volumes with interstitial crowding. There is no edema, consolidation, effusion, or pneumothorax. Normal heart size when allowing for pericardial fat pad. Negative upper mediastinal contours. Azygos fissure noted. IMPRESSION: No active cardiopulmonary disease. Electronically Signed   By: Marnee Spring M.D.   On: 09/03/2015 01:39   She will follow-up with Dr. Loleta Chance, her neurologist. She has oxycodone at home.     Paula Libra, MD 09/03/15 [phone]

## 2015-09-03 NOTE — ED Notes (Signed)
MD at bedside. 

## 2015-09-03 NOTE — Discharge Instructions (Signed)
Cervical Radiculopathy °Cervical radiculopathy happens when a nerve in the neck (cervical nerve) is pinched or bruised. This condition can develop because of an injury or as part of the normal aging process. Pressure on the cervical nerves can cause pain or numbness that runs from the neck all the way down into the arm and fingers. Usually, this condition gets better with rest. Treatment may be needed if the condition does not improve.  °CAUSES °This condition may be caused by: °· Injury. °· Slipped (herniated) disk. °· Muscle tightness in the neck because of overuse. °· Arthritis. °· Breakdown or degeneration in the bones and joints of the spine (spondylosis) due to aging. °· Bone spurs that may develop near the cervical nerves. °SYMPTOMS °Symptoms of this condition include: °· Pain that runs from the neck to the arm and hand. The pain can be severe or irritating. It may be worse when the neck is moved. °· Numbness or weakness in the affected arm and hand. °DIAGNOSIS °This condition may be diagnosed based on symptoms, medical history, and a physical exam. You may also have tests, including: °· X-rays. °· CT scan. °· MRI. °· Electromyogram (EMG). °· Nerve conduction tests. °TREATMENT °In many cases, treatment is not needed for this condition. With rest, the condition usually gets better over time. If treatment is needed, options may include: °· Wearing a soft neck collar for short periods of time. °· Physical therapy to strengthen your neck muscles. °· Medicines, such as NSAIDs, oral corticosteroids, or spinal injections. °· Surgery. This may be needed if other treatments do not help. Various types of surgery may be done depending on the cause of your problems. °HOME CARE INSTRUCTIONS °Managing Pain °· Take over-the-counter and prescription medicines only as told by your health care provider. °· If directed, apply ice to the affected area. °¨ Put ice in a plastic bag. °¨ Place a towel between your skin and the  bag. °¨ Leave the ice on for 20 minutes, 2-3 times per day. °· If ice does not help, you can try using heat. Take a warm shower or warm bath, or use a heat pack as told by your health care provider. °· Try a gentle neck and shoulder massage to help relieve symptoms. °Activity °· Rest as needed. Follow instructions from your health care provider about any restrictions on activities. °· Do stretching and strengthening exercises as told by your health care provider or physical therapist. °General Instructions °· If you were given a soft collar, wear it as told by your health care provider. °· Use a flat pillow when you sleep. °· Keep all follow-up visits as told by your health care provider. This is important. °SEEK MEDICAL CARE IF: °· Your condition does not improve with treatment. °SEEK IMMEDIATE MEDICAL CARE IF: °· Your pain gets much worse and cannot be controlled with medicines. °· You have weakness or numbness in your hand, arm, face, or leg. °· You have a high fever. °· You have a stiff, rigid neck. °· You lose control of your bowels or your bladder (have incontinence). °· You have trouble with walking, balance, or speaking. °  °This information is not intended to replace advice given to you by your health care provider. Make sure you discuss any questions you have with your health care provider. °  °Document Released: 05/23/2001 Document Revised: 05/19/2015 Document Reviewed: 10/22/2014 °[employer] Interactive Patient Education ©2016 [employer] ° °

## 2015-09-03 NOTE — ED Notes (Signed)
Upper left sided CP and upper left back pain since yesterday.  Worse with movement. Left arm and left sided neck pain started tonight.

## 2015-09-12 DIAGNOSIS — I5032 Chronic diastolic (congestive) heart failure: Secondary | ICD-10-CM

## 2015-09-12 HISTORY — DX: Chronic diastolic (congestive) heart failure: I50.32

## 2015-09-15 DIAGNOSIS — R9439 Abnormal result of other cardiovascular function study: Secondary | ICD-10-CM | POA: Insufficient documentation

## 2015-09-20 ENCOUNTER — Encounter (HOSPITAL_BASED_OUTPATIENT_CLINIC_OR_DEPARTMENT_OTHER): Payer: Self-pay

## 2015-09-20 ENCOUNTER — Emergency Department (HOSPITAL_BASED_OUTPATIENT_CLINIC_OR_DEPARTMENT_OTHER)
Admission: EM | Admit: 2015-09-20 | Discharge: 2015-09-20 | Disposition: A | Discharge status: Home / Self Care | Payer: BLUE CROSS/BLUE SHIELD | Attending: Emergency Medicine | Admitting: Emergency Medicine

## 2015-09-20 DIAGNOSIS — J45909 Unspecified asthma, uncomplicated: Secondary | ICD-10-CM | POA: Diagnosis not present

## 2015-09-20 DIAGNOSIS — Z872 Personal history of diseases of the skin and subcutaneous tissue: Secondary | ICD-10-CM | POA: Insufficient documentation

## 2015-09-20 DIAGNOSIS — Z7982 Long term (current) use of aspirin: Secondary | ICD-10-CM | POA: Diagnosis not present

## 2015-09-20 DIAGNOSIS — Z7951 Long term (current) use of inhaled steroids: Secondary | ICD-10-CM | POA: Insufficient documentation

## 2015-09-20 DIAGNOSIS — E079 Disorder of thyroid, unspecified: Secondary | ICD-10-CM | POA: Diagnosis not present

## 2015-09-20 DIAGNOSIS — I10 Essential (primary) hypertension: Secondary | ICD-10-CM | POA: Diagnosis not present

## 2015-09-20 DIAGNOSIS — Z880 Allergy status to penicillin: Secondary | ICD-10-CM | POA: Insufficient documentation

## 2015-09-20 DIAGNOSIS — Z87891 Personal history of nicotine dependence: Secondary | ICD-10-CM | POA: Insufficient documentation

## 2015-09-20 DIAGNOSIS — K0889 Other specified disorders of teeth and supporting structures: Secondary | ICD-10-CM | POA: Diagnosis present

## 2015-09-20 DIAGNOSIS — Y828 Other medical devices associated with adverse incidents: Secondary | ICD-10-CM | POA: Diagnosis not present

## 2015-09-20 DIAGNOSIS — T85848A Pain due to other internal prosthetic devices, implants and grafts, initial encounter: Secondary | ICD-10-CM | POA: Insufficient documentation

## 2015-09-20 DIAGNOSIS — Z794 Long term (current) use of insulin: Secondary | ICD-10-CM | POA: Insufficient documentation

## 2015-09-20 DIAGNOSIS — Z8719 Personal history of other diseases of the digestive system: Secondary | ICD-10-CM | POA: Insufficient documentation

## 2015-09-20 DIAGNOSIS — E119 Type 2 diabetes mellitus without complications: Secondary | ICD-10-CM | POA: Diagnosis not present

## 2015-09-20 DIAGNOSIS — Z8619 Personal history of other infectious and parasitic diseases: Secondary | ICD-10-CM | POA: Diagnosis not present

## 2015-09-20 DIAGNOSIS — Z79899 Other long term (current) drug therapy: Secondary | ICD-10-CM | POA: Diagnosis not present

## 2015-09-20 LAB — CBC WITH DIFFERENTIAL/PLATELET
Basophils Absolute: 0 10*3/uL (ref 0.0–0.1)
Basophils Relative: 0 %
Eosinophils Absolute: 0.1 10*3/uL (ref 0.0–0.7)
Eosinophils Relative: 1 %
HCT: 46.4 % — ABNORMAL HIGH (ref 36.0–46.0)
Hemoglobin: 14.5 g/dL (ref 12.0–15.0)
Lymphocytes Relative: 23 %
Lymphs Abs: 2.4 10*3/uL (ref 0.7–4.0)
MCH: 27.8 pg (ref 26.0–34.0)
MCHC: 31.3 g/dL (ref 30.0–36.0)
MCV: 88.9 fL (ref 78.0–100.0)
Monocytes Absolute: 1 10*3/uL (ref 0.1–1.0)
Monocytes Relative: 9 %
Neutro Abs: 7 10*3/uL (ref 1.7–7.7)
Neutrophils Relative %: 67 %
Platelets: 190 10*3/uL (ref 150–400)
RBC: 5.22 MIL/uL — ABNORMAL HIGH (ref 3.87–5.11)
RDW: 13.5 % (ref 11.5–15.5)
WBC: 10.5 10*3/uL (ref 4.0–10.5)

## 2015-09-20 LAB — BASIC METABOLIC PANEL
Anion gap: 11 (ref 5–15)
BUN: 6 mg/dL (ref 6–20)
CO2: 29 mmol/L (ref 22–32)
Calcium: 9.5 mg/dL (ref 8.9–10.3)
Chloride: 98 mmol/L — ABNORMAL LOW (ref 101–111)
Creatinine, Ser: 0.84 mg/dL (ref 0.44–1.00)
GFR calc Af Amer: >60 mL/min (ref >60)
GFR calc non Af Amer: >60 mL/min (ref >60)
Glucose, Bld: 235 mg/dL — ABNORMAL HIGH (ref 65–99)
Potassium: 4.8 mmol/L (ref 3.5–5.1)
Sodium: 138 mmol/L (ref 135–145)

## 2015-09-20 MED ORDER — HYDROMORPHONE HCL 1 MG/ML IJ SOLN
1.0000 mg | Freq: Once | INTRAMUSCULAR | Status: AC
  Administered 2015-09-20: 1 mg via INTRAVENOUS
  Filled 2015-09-20: qty 1

## 2015-09-20 MED ORDER — CLINDAMYCIN PHOSPHATE 600 MG/50ML IV SOLN
600.0000 mg | Freq: Once | INTRAVENOUS | Status: AC
  Administered 2015-09-20: 600 mg via INTRAVENOUS
  Filled 2015-09-20: qty 50

## 2015-09-20 MED ORDER — ONDANSETRON HCL 4 MG/2ML IJ SOLN
4.0000 mg | Freq: Once | INTRAMUSCULAR | Status: AC
  Administered 2015-09-20: 4 mg via INTRAVENOUS
  Filled 2015-09-20: qty 2

## 2015-09-20 MED ORDER — CLINDAMYCIN HCL 300 MG PO CAPS: 300.0000 mg | ORAL_CAPSULE | Freq: Four times a day (QID) | ORAL | Status: DC

## 2015-09-20 MED FILL — CLINDAMYCIN HCL 300 MG CAPS: 300 | 7 days supply | Qty: 28 | Fill #0

## 2015-09-20 NOTE — ED Provider Notes (Signed)
CSN: [id number]     Arrival date & time 09/20/15  1527 History  By signing my name below, I, Budd Palmer, attest that this documentation has been prepared under the direction and in the presence of Geoffery Lyons, MD. Electronically Signed: Budd Palmer, ED Scribe. 09/20/2015. 3:53 PM.      Chief Complaint  Patient presents with  . Dental Pain   Patient is a 55 y.o. female presenting with tooth pain. The history is provided by the patient and the spouse. No language interpreter was used.  Dental Pain Location:  Lower Quality:  Aching Severity:  Moderate Onset quality:  Gradual Duration:  4 days Timing:  Constant Progression:  Worsening Chronicity:  New Context: abscess and dental fracture   Relieved by:  Nothing Associated symptoms: facial pain   Risk factors: diabetes    HPI Comments: [name] is a 55 y.o. female former smoker with a PMHx of asthma, HTN, DM, and thyroid disease who presents to the Emergency Department complaining of constant, aching, worsening, left lower dental pain onset 4 days ago. Pt states the left lower bicuspid fractured 6 months ago and notes she did not have any pain from that area since then, until it began again within the past few days. She reports associated pain radiating from the left lower jaw up the left side of the face to the ear and sinuses, as well as into the left side of the head. She also endorses vomiting from the pain. She states it feels as though she may have an abscess. She does not currently have a dentist. She notes she may have a heart disorder and has been wearing a heart monitor. She also reports a PMHx of nerve damage and notes she has an autoimmune disease that causes her to "feel pain all over." She states she wears a fentanyl patch and only takes oxycodone as needed, neither of which have helped to relieve her current pain. Per spouse, pt was allergic to penicillins as a child with an unknown reaction, but was given penicillins for  C.difficile without problems. He notes pt has had sepsis in the past from a dental abscess as well.   Past Medical History  Diagnosis Date  . Asthma   . Hypertension   . Diabetes mellitus   . IBS (irritable bowel syndrome)   . GERD (gastroesophageal reflux disease)   . Eczema   . Thyroid disease   . Mixed connective tissue disease (HCC)   . C. difficile diarrhea    Past Surgical History  Procedure Laterality Date  . Breast surgery    . Cholecystectomy    . Abdominal hysterectomy    . Appendectomy    . Leg surgery Left     muscle biospy  . Wrist surgery Left    Family History  Problem Relation Age of Onset  . CAD Mother   . Hypertension Mother   . CAD Father    Social History  Substance Use Topics  . Smoking status: Former Smoker    Quit date: 02/11/2004  . Smokeless tobacco: None  . Alcohol Use: Yes     Comment: once a month   OB History    No data available     Review of Systems  HENT: Positive for dental problem.   All other systems reviewed and are negative.   Allergies  Fish allergy; Gabapentin; Peanut-containing drug products; Atrovent; Crestor; Ipratropium-albuterol; and Penicillins  Home Medications   Prior to Admission medications   Medication  Sig Start Date End Date Taking? Authorizing Provider  albuterol (PROVENTIL HFA;VENTOLIN HFA) 108 (90 BASE) MCG/ACT inhaler Inhale 2 puffs into the lungs every 4 (four) hours as needed for wheezing or shortness of breath. 11/14/12   Richarda Overlie, MD  Alpha Lipoic Acid 200 MG CAPS Take 600 mg by mouth 3 (three) times daily.    Historical Provider, MD  aspirin EC 81 MG tablet Take 81 mg by mouth daily.    Historical Provider, MD  b complex vitamins tablet Take 1 tablet by mouth daily.    Historical Provider, MD  betamethasone dipropionate (DIPROLENE) 0.05 % cream Apply 1 application topically daily as needed (eczema).    Historical Provider, MD  cetirizine (ZYRTEC) 10 MG tablet Take 10 mg by mouth daily as needed  for allergies.    Historical Provider, MD  clotrimazole-betamethasone (LOTRISONE) cream Apply 1 application topically daily as needed (eczema).    Historical Provider, MD  diphenhydrAMINE (BENADRYL) 25 MG tablet Take 1 tablet (25 mg total) by mouth every 6 (six) hours as needed for itching or allergies. 04/05/13   Fayrene Helper, PA-C  enalapril (VASOTEC) 10 MG tablet Take 10 mg by mouth 2 (two) times daily.    Historical Provider, MD  Estrogens, Conjugated (PREMARIN VA) Place 1 application vaginally 3 (three) times a week. On Saturdays, Mondays, and Wednesday (cream)    Historical Provider, MD  fentaNYL (DURAGESIC - DOSED MCG/HR) 100 MCG/HR Place 1 patch onto the skin every 3 (three) days.      Historical Provider, MD  Fluocinolone Acetonide 0.01 % OIL Place 1 drop in ear(s) 2 (two) times daily.    Historical Provider, MD  fluticasone (FLONASE) 50 MCG/ACT nasal spray Place 2 sprays into the nose 2 (two) times daily.      Historical Provider, MD  furosemide (LASIX) 20 MG tablet Take 20 mg by mouth 3 (three) times daily as needed for fluid.    Historical Provider, MD  gabapentin (NEURONTIN) 100 MG capsule Take 400-700 mg by mouth See admin instructions. Pt takes 400mg  in the morning, 400mg  at lunchtime and 700mg  in the evening    Historical Provider, MD  GLUCOMANNAN PO Take 700 mg by mouth 2 (two) times daily.    Historical Provider, MD  Guaifenesin (MUCINEX MAXIMUM STRENGTH) 1200 MG TB12 Take 1,200 mg by mouth 2 (two) times daily.    Historical Provider, MD  hydroxychloroquine (PLAQUENIL) 200 MG tablet Take 200 mg by mouth 2 (two) times daily.    Historical Provider, MD  insulin regular human CONCENTRATED (HUMULIN R) 500 UNIT/ML SOLN injection Inject 10-40 Units into the skin 3 (three) times daily with meals. Based on sliding scale    Historical Provider, MD  levalbuterol (XOPENEX) 1.25 MG/0.5ML nebulizer solution Take 1 ampule by nebulization 4 (four) times daily as needed for wheezing or shortness of  breath.    Historical Provider, MD  levothyroxine (SYNTHROID, LEVOTHROID) 200 MCG tablet Take 200 mcg by mouth daily. Take with 2 25 mcg tablets for a 250 mcg dose    Historical Provider, MD  magnesium oxide (MAG-OX) 400 MG tablet Take 400 mg by mouth 3 (three) times daily.    Historical Provider, MD  metFORMIN (GLUCOPHAGE) 1000 MG tablet Take 1,000 mg by mouth 2 (two) times daily with a meal.      Historical Provider, MD  metoCLOPramide (REGLAN) 5 MG tablet Take 1 tablet (5 mg total) by mouth 4 (four) times daily -  before meals and at bedtime. 02/13/15  Osvaldo Shipper, MD  milk thistle 175 MG tablet Take 1,000 mg by mouth 2 (two) times daily.    Historical Provider, MD  Multiple Vitamin (MULTIVITAMIN) tablet Take 1 tablet by mouth daily.    Historical Provider, MD  mupirocin cream (BACTROBAN) 2 % Apply 1 application topically daily as needed (eczema).    Historical Provider, MD  niacin (NIASPAN) 500 MG CR tablet Take 1,000 mg by mouth at bedtime.      Historical Provider, MD  Omega-3 Fatty Acids (FISH OIL) 1000 MG CPDR Take 1 capsule by mouth daily.    Historical Provider, MD  ondansetron (ZOFRAN ODT) 4 MG disintegrating tablet 4mg  ODT q4 hours prn nausea/vomit 08/01/13   Arthor Captain, PA-C  Oxycodone HCl 10 MG TABS Take 10 mg by mouth every 4 (four) hours as needed (for pain).    Historical Provider, MD  oxymetazoline (AFRIN) 0.05 % nasal spray Place 1 spray into both nostrils 2 (two) times daily as needed for congestion.    Historical Provider, MD  Polyethyl Glycol-Propyl Glycol (SYSTANE ULTRA OP) Apply 1 drop to eye daily as needed (dry eyes).    Historical Provider, MD  promethazine (PHENERGAN) 25 MG tablet Take 25 mg by mouth every 6 (six) hours as needed for nausea or vomiting.    Historical Provider, MD  quiNINE (QUALAQUIN) 324 MG capsule Take 648 mg by mouth daily as needed (inflammation).    Historical Provider, MD  saccharomyces boulardii (FLORASTOR) 250 MG capsule Take 1 capsule (250 mg  total) by mouth 2 (two) times daily. 02/13/15   Osvaldo Shipper, MD  vancomycin (VANCOCIN) 125 MG capsule Take 1 capsule (125 mg total) by mouth 4 (four) times daily. For 13 more days 02/13/15   Osvaldo Shipper, MD  vitamin A 91478 UNIT capsule Take 10,000 Units by mouth daily.    Historical Provider, MD  vitamin B-12 (CYANOCOBALAMIN) 1000 MCG tablet Take 1,000 mcg by mouth daily.      Historical Provider, MD  Vitamins-Lipotropics (LIPO-FLAVONOID PLUS PO) Take 1 tablet by mouth 3 (three) times daily.    Historical Provider, MD   BP 151/87 mmHg  Pulse 94  Temp(Src) 98.2 F (36.8 C) (Oral)  Resp 20  SpO2 100% Physical Exam  Constitutional: She is oriented to person, place, and time. She appears well-developed and well-nourished.  HENT:  Head: Normocephalic and atraumatic.  The bottom left bicuspid it broken off at the gumline. There is mild gingival inflammation surrounding it. No definite abscess palpable. The soft tissues of the neck and submental space are soft and without crepitus.  Eyes: Conjunctivae are normal. Right eye exhibits no discharge. Left eye exhibits no discharge.  Pulmonary/Chest: Effort normal. No respiratory distress.  Neurological: She is alert and oriented to person, place, and time. Coordination normal.  Skin: Skin is warm and dry. No rash noted. She is not diaphoretic. No erythema.  Psychiatric: She has a normal mood and affect.  Nursing note and vitals reviewed.   ED Course  Procedures  DIAGNOSTIC STUDIES: Oxygen Saturation is 100% on RA, normal by my interpretation.    COORDINATION OF CARE: 3:46 PM - Discussed plans to order IV antibiotics and diagnostic studies. Will refer to a dentist. Pt advised of plan for treatment and pt agrees.  Labs Review Labs Reviewed  BASIC METABOLIC PANEL - Abnormal; Notable for the following:    Chloride 98 (*)    Glucose, Bld 235 (*)    All other components within normal limits  CBC WITH DIFFERENTIAL/PLATELET -  Abnormal; Notable  for the following:    RBC 5.22 (*)    HCT 46.4 (*)    All other components within normal limits    Imaging Review No results found. I have personally reviewed and evaluated these images and lab results as part of my medical decision-making.   EKG Interpretation None      MDM   Final diagnoses:  None    Patient with history of diabetes presents with tooth pain. She has mild swelling to her left cheek, however no obvious abscess within her mouth. Her and her husband are both very concerned about the possibility of sepsis. I see nothing that indicates this. She is afebrile, there is no tachycardia or hypotension and she has no leukocytosis. She was given IV clindamycin and I believe is appropriate for discharge with oral clindamycin and follow-up tomorrow with her dentist. She tells me she RA has this appointment scheduled.  I personally performed the services described in this documentation, which was scribed in my presence. The recorded information has been reviewed and is accurate.       Geoffery Lyons, MD 09/20/15 2145

## 2015-09-20 NOTE — Discharge Instructions (Signed)
Clindamycin as prescribed.  Continue your pain medication as prescribed as needed for pain.  Follow-up with your dentist tomorrow as scheduled.    Emergency Department Resource Guide 1) Find a Doctor and Pay Out of Pocket Although you won't have to find out who is covered by your insurance plan, it is a good idea to ask around and get recommendations. You will then need to call the office and see if the doctor you have chosen will accept you as a new patient and what types of options they offer for patients who are self-pay. Some doctors offer discounts or will set up payment plans for their patients who do not have insurance, but you will need to ask so you aren't surprised when you get to your appointment.  2) Contact Your Local Health Department Not all health departments have doctors that can see patients for sick visits, but many do, so it is worth a call to see if yours does. If you don't know where your local health department is, you can check in your phone book. The CDC also has a tool to help you locate your state's health department, and many state websites also have listings of all of their local health departments.  3) Find a Walk-in Clinic If your illness is not likely to be very severe or complicated, you may want to try a walk in clinic. These are popping up all over the country in pharmacies, drugstores, and shopping centers. They're usually staffed by nurse practitioners or physician assistants that have been trained to treat common illnesses and complaints. They're usually fairly quick and inexpensive. However, if you have serious medical issues or chronic medical problems, these are probably not your best option.  No Primary Care Doctor: - Call Health Connect at  [phone] - they can help you locate a primary care doctor that  accepts your insurance, provides certain services, etc. - Physician Referral Service- [phone]  Chronic Pain Problems: Organization          Address  Phone   Notes  [facility]  [phone] Patients need to be referred by their primary care doctor.   Medication Assistance: Organization         Address  Phone   Notes  [facility] Medication [facility] [address] [place] [place], [place] [address] [phone] --Must be a resident of [facility] -- Must have NO insurance coverage whatsoever (no Medicaid/ Medicare, etc.) -- The pt. MUST have a primary care doctor that directs their care regularly and follows them in the community   [facility]  [phone]   [employer]  [phone]    Agencies that provide inexpensive medical care: Organization         Address  Phone   Notes  [facility]  [phone]   [facility]    [phone]   [facility] [address] [place], [place] [address] [phone]   [facility] [address] [place] [place] [phone]   [facility]anned Parenthood    [phone]   [facility]    [phone]   [facility]  [address], [place] Phone:  [phone], Fax:  [phone] Hours of Operation:  9 am - 6 pm, M-F.  Also accepts Medicaid/Medicare and self-pay.  [facility]  [address], [place] Phone: [phone]  [phone], Fax: [phone]. Hours of Operation:  8:30 am - 5:30 pm, M-F.  Also accepts Medicaid and self-pay.  [facility] [place] [address], [place] Phone: [phone]   [place], [place], [place] [phone], Ext. [phone] Mondays & Thursdays: 7-9 AM.  First 15 patients are seen on a first come, first serve basis.    [place] Providers:  Organization         Address  Phone   Notes  [facility] [address], [place] [phone] Also accepts self-pay patients.  [facility] [address], [place]  [phone]   [place], [address], [place] [phone]   [facility] [address] [place] [phone]   Lucianne Lei [address], [place]   [phone] Only accepts [place] Access [place] patients after they have their name applied to their card.   Self-Pay (no insurance) in [facility]:  Organization         Address  Phone   Notes  Sickle Cell Patients, [facility] [place] [phone]   [facility] [place] [phone]   [facility] [place]  [place], [place], [place] [phone]   [facility]/Dr. Osei-Bonsu  [address] [place] or [address], [place] [phone] Phone number for both [place] and [place] locations is the same.  [facility] [address] [place] [phone]   [facility] [address], [place] or [address] [phone]   [facility] [address] [place] [phone], phone; [phone], fax Sees patients 1st and 3rd Saturday of every month.  Must not qualify for public or private insurance (i.e. Medicaid, Medicare, [place] Health Choice, Veterans' Benefits)  Household income should be no more than 200% of the poverty level The clinic cannot treat you if you are pregnant or think you are pregnant  Sexually transmitted diseases are not treated at the clinic.    Dental Care: Organization         Address  Phone  Notes  [facility] [place] [phone] Accepts children up to age 23 who are enrolled in [place] or [place]; pregnant women with a Medicaid card; and  children who have applied for Medicaid or [place] Health Choice, but were declined, whose parents can pay a reduced fee at time of service.  [facility] of [facility]  [address], [place] [phone] Accepts children up to age 22 who are enrolled in [place] or [place]; pregnant women with a Medicaid card; and children who have applied for Medicaid or [place] Health Choice, but were declined, whose parents can pay a reduced fee at time of service.  [facility]  [place] [phone] Patients are seen by appointment only. Walk-ins are not accepted. [place] will see patients 70 years of age and older. Monday - Tuesday (8am-5pm) Most Wednesdays (8:30-5pm) $30 per visit, cash only  [facility]  [address], [employer]  [place] [phone] Patients are seen by appointment only. Walk-ins are not accepted. [facility] will see patients 18 years of age and older. °One Wednesday Evening (Monthly: Volunteer Based).  $30 per visit, cash only  °[facility]  [phone] for adults; Children under age 4, call Graduate Pediatric Dentistry at [phone]. Children aged 4-14, please call [phone] to request a pediatric application. ° Dental services are provided in all areas of dental care including fillings, crowns and bridges, complete and partial dentures, implants, gum treatment, root canals, and extractions. Preventive care is also provided. Treatment is provided to both adults and children. °Patients are selected via a lottery and there is often a waiting list. °  °[facility] [address], °[place] ° ([phone] www.drcivils.[email or web address] °  °[facility] [address], [place], [place] [phone], Ext. [phone] Second and Fourth Thursday of each month, opens at 6:30 AM; Clinic ends at 9 AM.  Patients are seen on a first-come first-served  basis, and a limited number are seen during each clinic.  ° °[facility] ° [address], [place], [place] [phone]   Eligibility Requirements °You must have lived in [place], [place], or [place] counties for at least the last three months. °  You cannot be eligible for state or federal sponsored healthcare insurance, including Veterans Administration, Medicaid, or Medicare. °  You generally cannot be eligible for healthcare insurance through your employer.  °  How to apply: °Eligibility screenings are held every Tuesday and Wednesday afternoon from 1:00 pm until 4:00 pm. You do not need an appointment for the interview!  °[facility] [address], [place], [place] [phone]   °[facility]  [phone]   °[facility]  [phone]   °[facility]  336-570-6415   ° °Behavioral Health Resources in the Community: °Intensive Outpatient Programs °Organization         Address  Phone  Notes  °[facility] [address], [place], [place] [phone]   °[facility] [address], [place], [place] [phone]   °[facility] [address], [place], [place] ° 336-882-2125   °[facility] [address],  °[place], [place] 1-800-853-5163 or 336-641-4981   °Substance Abuse Resources °Organization         Address  Phone  Notes  °[facility]  336-882-2125   °[facility]  336-784-9470   °[facility]  336-285-9073   °[facility]  336-845-3988   °Residential & Outpatient Substance Abuse Program  1-800-659-3381   °Psychological Services °Organization         Address  Phone  Notes  °[facility]  336- [phone]   °[facility]  336- 378-7881   °[facility] [address], [place] 1-800-853-5163 or 336-641-4981   ° °Mobile Crisis Teams °Organization          Address  Phone  Notes  °[facility], Mobile Crisis Care Unit  1-877-626-1772   °Assertive °[facility]otherapeutic Services ° [address] [place], [place] 336-834-9664   °Sharon DeEsch [address] °[place] 336-554-5454   ° °Self-Help/Support Groups °Organization         Address  Phone             Notes  °[facility] [place] - variety of support groups    336- H3156881 Call for more information  Narcotics Anonymous (NA), [facility] [address], [employer] [place]  2 meetings at this location   Residential Facilities manager         Address  Phone  Notes  [facility] [place],    [place]  [phone]   [facility]  [address], [place] [id number], [place], [place]   [place], [place] [phone] Admissions: 8am-3pm M-F  [facility] Substance [place] [address]    [employer], [place] [phone]   [facility] [address] [place], [place], [place]   [facility] [address],  [place], [place]   [facility] - Intensive Outpatient [address], [place], [place]   [facility] ([place].,  [place], [place] 1-(336)590-7905 or ([phone]   [facility] ([facility]) [address], [place], [place] Accepts Medicaid  [facility] [address]  [place] [phone]-(661)[phone] Substance Abuse/Addiction Treatment   [facility] Organization         Address  Phone  Notes  [facility]  512-005-4881   Domenic Schwab, PhD [address] Arlis Porta [place], [place]   [phone] or [phone]   [place], [place] [phone]   [facility] [address], [place], [place] [phone] Insurance/Medicaid/sponsorship  through [facility] [address]                                    [place], [place] [phone] [place] [address], [place] [phone]    Dr. Adele Schilder  [phone]   [facility] Dept. 1) [address] [place] 2) [place] 3)  [place], [place] [phone]  [phone]   [place] [phone] or [phone] (After Hours)

## 2015-09-20 NOTE — ED Notes (Addendum)
C/o tooth abscess left lower-pain started Friday-pt presents to triage in w/c-husband states pt weak today does not use ambulation assist

## 2015-09-21 ENCOUNTER — Telehealth: Payer: Self-pay | Admitting: *Deleted

## 2015-09-21 NOTE — Telephone Encounter (Signed)
Patient's husband called stating that she was given clindamycin at the ED for a dental abscess. She has had c-diff previously and thinks she is suppose to avoid clindamycin. Please advise

## 2015-09-21 NOTE — Telephone Encounter (Signed)
Was seen by dentist and tooth was pulled today. She has not been seen by allergist; will go ahead with the clindamycin. Wendall Mola

## 2015-09-21 NOTE — Telephone Encounter (Signed)
If she has an abscess tooth, i would recommend that she goes to dentist to see if it needs drainage or extraction in addition to the abtx. At last appt, i had asked her to go to an allergist to see if she can tolerate penicillins. If she did do that then, can treat with penicillin. If she didn't go to an allergist PLUS has abscess tooth ,then clinda is her alternative option. She runs a risk of having cdifficile by taking clinda. But you have to balance the need for treatment for abscess tooth

## 2015-10-01 DIAGNOSIS — E039 Hypothyroidism, unspecified: Secondary | ICD-10-CM | POA: Insufficient documentation

## 2015-10-01 HISTORY — PX: CORONARY ANGIOPLASTY WITH STENT PLACEMENT: SHX49

## 2015-10-18 ENCOUNTER — Other Ambulatory Visit (HOSPITAL_BASED_OUTPATIENT_CLINIC_OR_DEPARTMENT_OTHER): Payer: Self-pay | Admitting: Specialist

## 2015-10-18 DIAGNOSIS — M4712 Other spondylosis with myelopathy, cervical region: Secondary | ICD-10-CM

## 2015-10-18 DIAGNOSIS — M5430 Sciatica, unspecified side: Secondary | ICD-10-CM

## 2015-11-06 ENCOUNTER — Ambulatory Visit (HOSPITAL_BASED_OUTPATIENT_CLINIC_OR_DEPARTMENT_OTHER)
Admission: RE | Admit: 2015-11-06 | Discharge: 2015-11-06 | Disposition: A | Discharge status: Home / Self Care | Payer: BLUE CROSS/BLUE SHIELD | Source: Ambulatory Visit | Attending: Specialist | Admitting: Specialist

## 2015-11-06 DIAGNOSIS — M4712 Other spondylosis with myelopathy, cervical region: Secondary | ICD-10-CM

## 2015-11-06 DIAGNOSIS — M5430 Sciatica, unspecified side: Secondary | ICD-10-CM

## 2015-11-24 DIAGNOSIS — R0602 Shortness of breath: Secondary | ICD-10-CM | POA: Insufficient documentation

## 2015-11-26 ENCOUNTER — Ambulatory Visit (INDEPENDENT_AMBULATORY_CARE_PROVIDER_SITE_OTHER): Payer: BLUE CROSS/BLUE SHIELD | Admitting: Cardiology

## 2015-11-26 ENCOUNTER — Encounter: Payer: Self-pay | Admitting: Cardiology

## 2015-11-26 VITALS — BP 128/66 | HR 94 | Ht 62.0 in | Wt 271.8 lb

## 2015-11-26 DIAGNOSIS — I10 Essential (primary) hypertension: Secondary | ICD-10-CM

## 2015-11-26 DIAGNOSIS — R011 Cardiac murmur, unspecified: Secondary | ICD-10-CM

## 2015-11-26 DIAGNOSIS — G4719 Other hypersomnia: Secondary | ICD-10-CM | POA: Diagnosis not present

## 2015-11-26 DIAGNOSIS — R400 Somnolence: Secondary | ICD-10-CM | POA: Insufficient documentation

## 2015-11-26 DIAGNOSIS — I2510 Atherosclerotic heart disease of native coronary artery without angina pectoris: Secondary | ICD-10-CM | POA: Diagnosis not present

## 2015-11-26 DIAGNOSIS — E785 Hyperlipidemia, unspecified: Secondary | ICD-10-CM

## 2015-11-26 HISTORY — DX: Other hypersomnia: G47.19

## 2015-11-26 NOTE — Patient Instructions (Signed)
Medication Instructions:  Your physician recommends that you continue on your current medications as directed. Please refer to the Current Medication list given to you today.   Labwork: Your physician recommends that you return for FASTING lab work within ONE WEEK (LFTs, Lipids).  Testing/Procedures: Your physician has requested that you have an echocardiogram. Echocardiography is a painless test that uses sound waves to create images of your heart. It provides your doctor with information about the size and shape of your heart and how well your heart's chambers and valves are working. This procedure takes approximately one hour. There are no restrictions for this procedure.  Your physician has recommended that you have a sleep study. This test records several body functions during sleep, including: brain activity, eye movement, oxygen and carbon dioxide blood levels, heart rate and rhythm, breathing rate and rhythm, the flow of air through your mouth and nose, snoring, body muscle movements, and chest and belly movement.  Follow-Up: You have been referred to LIPID CLINIC.   Your physician wants you to follow-up in: 6 months with Dr. Mayford Knife. You will receive a reminder letter in the mail two months in advance. If you don't receive a letter, please call our office to schedule the follow-up appointment.   Any Other Special Instructions Will Be Listed Below (If Applicable).     If you need a refill on your cardiac medications before your next appointment, please call your pharmacy.

## 2015-11-26 NOTE — Progress Notes (Signed)
Cardiology Office Note   Date:  11/26/2015   ID:  [name], DOB 13-Feb-1961, MRN [id number]  PCP:  Cheral Bay, MD    Chief Complaint  Patient presents with  . New Patient (Initial Visit)      History of Present Illness: [name] is a 55 y.o. female who presents for evaluation of CAD.  She was admitted to [facility] after stress test done for atypical chest pain showed ischemia in January. She underwent cath which showed normal LM, 30% LAD, 85% mid RCA and 95% distal RCA s/p PCI of the mid to distal RCA and now on DAPT with ASA and Ticagrelor.  She is doing well.  She denies any chest pain , SOB, DOE, LE edema, dizziness, palpitations, PND, orthopnea or syncope.  She recently complained of palpitations and wore a heart monitor that she states was normal.    Past Medical History  Diagnosis Date  . Asthma   . Hypertension   . Diabetes mellitus   . IBS (irritable bowel syndrome)   . GERD (gastroesophageal reflux disease)   . Eczema   . Thyroid disease   . Mixed connective tissue disease (HCC)   . C. difficile diarrhea   . Hyperlipidemia   . CHF (congestive heart failure) (HCC) 09/2015    cath with normal LM, 30% LAD, 85% mid RCA and 95% distal RCA s/p PCI of the mid to distal RCA and now on DAPT with ASA and Ticagrelor.    . Excessive daytime sleepiness 11/26/2015    Past Surgical History  Procedure Laterality Date  . Breast surgery    . Cholecystectomy    . Abdominal hysterectomy    . Appendectomy    . Leg surgery Left     muscle biospy  . Wrist surgery Left   . Cardiac catheterization    . Coronary angioplasty      normal LM, 30% LAD, 85% mid RCA and 95% distal RCA s/p PCI of the mid to distal RCA and now on DAPT with ASA and Ticagrelor.       Current Outpatient Prescriptions  Medication Sig Dispense Refill  . albuterol (PROVENTIL HFA;VENTOLIN HFA) 108 (90 BASE) MCG/ACT inhaler Inhale 2 puffs into the lungs every 4 (four) hours as  needed for wheezing or shortness of breath. 1 Inhaler 1  . albuterol (PROVENTIL) (2.5 MG/3ML) 0.083% nebulizer solution Inhale 2.5 mLs into the lungs as directed.  0  . Alpha Lipoic Acid 200 MG CAPS Take 600 mg by mouth 3 (three) times daily.    Marland Kitchen aspirin EC 81 MG tablet Take 81 mg by mouth daily.    Marland Kitchen b complex vitamins tablet Take 1 tablet by mouth daily.    . betamethasone dipropionate (DIPROLENE) 0.05 % cream Apply 1 application topically daily as needed (eczema).    . BRILINTA 90 MG TABS tablet Take 90 mg by mouth 2 (two) times daily.  2  . cetirizine (ZYRTEC) 10 MG tablet Take 10 mg by mouth daily as needed for allergies.    . clotrimazole-betamethasone (LOTRISONE) cream Apply 1 application topically daily as needed (eczema).    . diphenhydrAMINE (BENADRYL) 25 MG tablet Take 1 tablet (25 mg total) by mouth every 6 (six) hours as needed for itching or allergies. 10 tablet 0  . enalapril (VASOTEC) 10 MG tablet Take 10 mg by mouth 2 (two) times daily.    Marland Kitchen  Estrogens, Conjugated (PREMARIN VA) Place 1 application vaginally 3 (three) times a week. On Saturdays, Mondays, and Wednesday (cream)    . fentaNYL (DURAGESIC - DOSED MCG/HR) 100 MCG/HR Place 1 patch onto the skin every 3 (three) days.      . Fluocinolone Acetonide 0.01 % OIL Place 1 drop in ear(s) 2 (two) times daily.    . fluticasone (FLONASE) 50 MCG/ACT nasal spray Place 2 sprays into the nose 2 (two) times daily.      . furosemide (LASIX) 20 MG tablet Take 20 mg by mouth 3 (three) times daily as needed for fluid.    Marland Kitchen gabapentin (NEURONTIN) 100 MG capsule Take 400-700 mg by mouth See admin instructions. Pt takes 400mg  in the morning, 400mg  at lunchtime and 700mg  in the evening    . GLUCOMANNAN PO Take 700 mg by mouth 2 (two) times daily.    . Guaifenesin (MUCINEX MAXIMUM STRENGTH) 1200 MG TB12 Take 1,200 mg by mouth 2 (two) times daily.    . hydroxychloroquine (PLAQUENIL) 200 MG tablet Take 200 mg by mouth 2 (two) times daily.    .  insulin regular human CONCENTRATED (HUMULIN R) 500 UNIT/ML SOLN injection Inject 10-40 Units into the skin 3 (three) times daily with meals. Based on sliding scale    . levalbuterol (XOPENEX) 1.25 MG/0.5ML nebulizer solution Take 1 ampule by nebulization 4 (four) times daily as needed for wheezing or shortness of breath.    . levothyroxine (SYNTHROID, LEVOTHROID) 200 MCG tablet Take 200 mcg by mouth daily. Take with 2 25 mcg tablets for a 250 mcg dose    . levothyroxine (SYNTHROID, LEVOTHROID) 25 MCG tablet Take 25 mcg by mouth daily.  0  . magnesium oxide (MAG-OX) 400 MG tablet Take 400 mg by mouth 3 (three) times daily.    . metFORMIN (GLUCOPHAGE) 1000 MG tablet Take 1,000 mg by mouth 2 (two) times daily with a meal.      . metFORMIN (GLUCOPHAGE-XR) 500 MG 24 hr tablet Take 1,000 mg by mouth 2 (two) times daily.  0  . metoCLOPramide (REGLAN) 5 MG tablet Take 1 tablet (5 mg total) by mouth 4 (four) times daily -  before meals and at bedtime. 120 tablet 0  . metoprolol tartrate (LOPRESSOR) 25 MG tablet Take 25 mg by mouth 2 (two) times daily.  1  . milk thistle 175 MG tablet Take 1,000 mg by mouth 2 (two) times daily.    . Multiple Vitamin (MULTIVITAMIN) tablet Take 1 tablet by mouth daily.    . mupirocin cream (BACTROBAN) 2 % Apply 1 application topically daily as needed (eczema).    . niacin (NIASPAN) 500 MG CR tablet Take 1,000 mg by mouth at bedtime.      . Omega-3 Fatty Acids (FISH OIL) 1000 MG CPDR Take 1 capsule by mouth daily.    . ondansetron (ZOFRAN ODT) 4 MG disintegrating tablet 4mg  ODT q4 hours prn nausea/vomit 6 tablet 0  . Oxycodone HCl 10 MG TABS Take 10 mg by mouth every 4 (four) hours as needed (for pain).    oxymetazoline (AFRIN) 0.05 % nasal spray Place 1 spray into both nostrils 2 (two) times daily as needed for congestion.    Glycol-Propyl Glycol (SYSTANE ULTRA OP) Apply 1 drop to eye daily as needed (dry eyes).    . promethazine (PHENERGAN) 25 MG tablet Take 25  mg by mouth every 6 (six) hours as needed for nausea or vomiting.    . quiNINE )  324 MG capsule Take 648 mg by mouth daily as needed (inflammation).    . saccharomyces boulardii (FLORASTOR) 250 MG capsule Take 1 capsule (250 mg total) by mouth 2 (two) times daily. 30 capsule 0  . vancomycin (VANCOCIN) 125 MG capsule Take 1 capsule (125 mg total) by mouth 4 (four) times daily. For 13 more days 52 capsule 0  . vitamin A 71245 UNIT capsule Take 10,000 Units by mouth daily.    . vitamin B-12 (CYANOCOBALAMIN) 1000 MCG tablet Take 1,000 mcg by mouth daily.      . Vitamin D, Ergocalciferol, (DRISDOL) 50000 units CAPS capsule Take 1 capsule by mouth once a week.  1  . Vitamins-Lipotropics (LIPO-FLAVONOID PLUS PO) Take 1 tablet by mouth 3 (three) times daily.     No current facility-administered medications for this visit.    Allergies:   Fish allergy; Gabapentin; Peanut-containing drug products; Atrovent; Crestor; Ipratropium-albuterol; Penicillins; Suprep; and Tape    Social History:  The patient  reports that she quit smoking about 11 years ago. She does not have any smokeless tobacco history on file. She reports that she drinks alcohol. She reports that she does not use illicit drugs.   Family History:  The patient's family history includes Breast cancer in her paternal aunt; CAD in her father and mother; Heart attack in her father and mother; Hypertension in her brother, brother, and mother; Pancreatic cancer in her paternal aunt.    ROS:  Please see the history of present illness.   Otherwise, review of systems are positive for none.   All other systems are reviewed and negative.    PHYSICAL EXAM: VS:  BP 128/66 mmHg  Pulse 94  Ht 5\' 2"  (1.575 m)  Wt 271 lb 12.8 oz (123.288 kg)  BMI 49.70 kg/m2 , BMI Body mass index is 49.7 kg/(m^2). GEN: Well nourished, well developed, in no acute distress HEENT: normal Neck: no JVD, carotid bruits, or masses Cardiac: RRR; no murmurs, rubs, or  gallops,no edema  Respiratory:  clear to auscultation bilaterally, normal work of breathing GI: soft, nontender, nondistended, + BS MS: no deformity or atrophy Skin: warm and dry, no rash Neuro:  Strength and sensation are intact Psych: euthymic mood, full affect   EKG:  EKG is not ordered today.    Recent Labs: 02/11/2015: ALT 40; B Natriuretic Peptide 62.6 09/20/2015: BUN 6; Creatinine, Ser 0.84; Hemoglobin 14.5; Platelets 190; Potassium 4.8; Sodium 138    Lipid Panel    Component Value Date/Time   CHOL 191 02/11/2015 0310   TRIG 140 02/11/2015 0310   HDL 37* 02/11/2015 0310   CHOLHDL 5.2 02/11/2015 0310   VLDL 28 02/11/2015 0310   LDLCALC 126* 02/11/2015 0310      Wt Readings from Last 3 Encounters:  11/26/15 271 lb 12.8 oz (123.288 kg)  09/03/15 275 lb (124.739 kg)  07/14/15 275 lb (124.739 kg)       ASSESSMENT AND PLAN:  1.  ASCAD with cath showing normal LM, 30% LAD, 85% mid RCA and 95% distal RCA s/p PCI of the mid to distal RCA and now on DAPT with ASA and Ticagrelor.  She is doing well with no angina.   2.  HTN - controlled on BB and ACE I.   3.  Hyperlipidemia with LDL goal < 70.  She is intolerant to Niaspan due to rash.  She has tried crestor and got severe athralgias and has also tried pravastatin, welchol and did not tolerate.  Will check  FLP and ALT.  I will refer her to lipid clinic for the PCSK 9 drug.   4.  Excessive daytime sleepiness with snoring and morbid obesity with SOB primarily at night - I suspect that she has OSA.  I will order a split night PSG.   5.  Heart Murmur - I will check a 2D echo to assess for valvular heart disease. 6.  Palpitations - she recently wore a heart monitor that reportedly was normal - i will get a copy of it.    Current medicines are reviewed at length with the patient today.  The patient does not have concerns regarding medicines.  The following changes have been made:  no change  Labs/ tests ordered today: See above  Assessment and Plan No orders of the defined types were placed in this encounter.     Disposition:   FU with me in 6 months  Signed, Quintella Reichert, MD  11/26/2015 2:11 PM    [facility] [address] [place], [place], [place]  [address] Phone: [phone]; Fax: [phone]

## 2015-12-08 ENCOUNTER — Telehealth (HOSPITAL_COMMUNITY): Payer: Self-pay | Admitting: *Deleted

## 2015-12-08 NOTE — Telephone Encounter (Signed)
Pt called back from conversation this morning. Pt given cpt code.  Pt indicated that she had contacted [employer].  Pt is responsible for 20% co insurance.  Pt asked the cost of the program.  Pt given this information.  Pt plans to talk with her husband to see if financially they will be able to do this.  Offered 2 times a week for a shorter period of time - Pt indicated that she belongs to a gym but was not consistently going prior to cardiac event.  Pt given information regarding the maintenance program which is self pay. Alanson Aly, BSN

## 2015-12-15 ENCOUNTER — Other Ambulatory Visit (HOSPITAL_COMMUNITY): Payer: BLUE CROSS/BLUE SHIELD

## 2015-12-15 ENCOUNTER — Ambulatory Visit: Payer: BLUE CROSS/BLUE SHIELD | Admitting: Pharmacist

## 2015-12-15 DIAGNOSIS — M5412 Radiculopathy, cervical region: Secondary | ICD-10-CM | POA: Insufficient documentation

## 2015-12-15 DIAGNOSIS — M5416 Radiculopathy, lumbar region: Secondary | ICD-10-CM | POA: Insufficient documentation

## 2015-12-16 ENCOUNTER — Ambulatory Visit (INDEPENDENT_AMBULATORY_CARE_PROVIDER_SITE_OTHER): Payer: BLUE CROSS/BLUE SHIELD | Admitting: Pharmacist

## 2015-12-16 ENCOUNTER — Ambulatory Visit (HOSPITAL_COMMUNITY): Payer: BLUE CROSS/BLUE SHIELD | Attending: Cardiology

## 2015-12-16 ENCOUNTER — Other Ambulatory Visit: Payer: Self-pay

## 2015-12-16 DIAGNOSIS — Z8249 Family history of ischemic heart disease and other diseases of the circulatory system: Secondary | ICD-10-CM | POA: Insufficient documentation

## 2015-12-16 DIAGNOSIS — I358 Other nonrheumatic aortic valve disorders: Secondary | ICD-10-CM | POA: Insufficient documentation

## 2015-12-16 DIAGNOSIS — E669 Obesity, unspecified: Secondary | ICD-10-CM | POA: Diagnosis not present

## 2015-12-16 DIAGNOSIS — Z6841 Body Mass Index (BMI) 40.0 and over, adult: Secondary | ICD-10-CM | POA: Diagnosis not present

## 2015-12-16 DIAGNOSIS — R011 Cardiac murmur, unspecified: Secondary | ICD-10-CM | POA: Insufficient documentation

## 2015-12-16 DIAGNOSIS — R197 Diarrhea, unspecified: Secondary | ICD-10-CM

## 2015-12-16 DIAGNOSIS — I10 Essential (primary) hypertension: Secondary | ICD-10-CM | POA: Diagnosis not present

## 2015-12-16 DIAGNOSIS — E785 Hyperlipidemia, unspecified: Secondary | ICD-10-CM | POA: Insufficient documentation

## 2015-12-16 DIAGNOSIS — E119 Type 2 diabetes mellitus without complications: Secondary | ICD-10-CM | POA: Diagnosis not present

## 2015-12-16 MED ORDER — PERFLUTREN LIPID MICROSPHERE
1.0000 mL | INTRAVENOUS | Status: AC | PRN
  Administered 2015-12-16: 1 mL via INTRAVENOUS

## 2015-12-16 NOTE — Progress Notes (Signed)
Patient ID:                  DOB:                          MRN:     HPI: [name] is a 55 y.o. female patient referred to lipid clinic by Dr Mayford Knife. PMH is significant for ASCAD (PCI of mid to distal RCA now on DAPT w/ ASA and Ticagrelor), HTN, HLD, DM2, IBS, GERD, hypothyroidism, and mixed connective tissue disease.   She reports that her mixed connective tissue disease predisposes her to muscle and joint pain which has been worsened by Statins in the past. Reports best friend and best friends grand-daughter dying in the past couple months which has caused her a lot of stress.    Current Medications: No current lipid lowering medications  Intolerances: Crestor 40 mg, Pravastatin, Lipitor, Simvastatin all cause muscle and joint pain where it was difficult to lift her arm and was very hard to walk. Upon discontinuation of all statins, the symptoms resolved after about a week. Welchol (muscle and joint pain), Niacin (severe flushing), fish oil (throat swelling) Risk Factors: Hx of ASCAD s/p PCI  LDL goal: <70 mg/dL   Diet:  She reports eating fast food and takeout recently due to stressors.  She has tried paleo in the past and plans to start day after easter.   Exercise: No exercise in the last year. Does have membership with the [facility].  Would like to restart water aerobics and cannot afford cardiac rehab.   Family History:  The patient's family history includes Breast cancer in her paternal aunt; CAD in her father and mother; Heart attack in her father and mother; Hypertension in her brother, brother, and mother; Pancreatic cancer in her paternal aunt.   Social History: The patient  reports that she quit smoking about 11 years ago. She does not have any smokeless tobacco history on file. She reports that she drinks alcohol. She reports that she does not use illicit drugs.  Labs: 02/11/15: TC 191, TG 140, HDL 37, LDL 126  Past Medical History  Diagnosis Date  . Asthma   . Hypertension    . Diabetes mellitus   . IBS (irritable bowel syndrome)   . GERD (gastroesophageal reflux disease)   . Eczema   . Thyroid disease   . Mixed connective tissue disease (HCC)   . C. difficile diarrhea   . Hyperlipidemia   . CHF (congestive heart failure) (HCC) 09/2015    cath with normal LM, 30% LAD, 85% mid RCA and 95% distal RCA s/p PCI of the mid to distal RCA and now on DAPT with ASA and Ticagrelor.    . Excessive daytime sleepiness 11/26/2015    Current Outpatient Prescriptions on File Prior to Visit  Medication Sig Dispense Refill  . albuterol (PROVENTIL HFA;VENTOLIN HFA) 108 (90 BASE) MCG/ACT inhaler Inhale 2 puffs into the lungs every 4 (four) hours as needed for wheezing or shortness of breath. 1 Inhaler 1  . albuterol (PROVENTIL) (2.5 MG/3ML) 0.083% nebulizer solution Inhale 2.5 mLs into the lungs as directed.  0  . Alpha Lipoic Acid 200 MG CAPS Take 600 mg by mouth 3 (three) times daily.    Marland Kitchen aspirin EC 81 MG tablet Take 81 mg by mouth daily.    Marland Kitchen b complex vitamins tablet Take 1 tablet by mouth daily.    . betamethasone dipropionate (DIPROLENE) 0.05 % cream  Apply 1 application topically daily as needed (eczema).    . BRILINTA 90 MG TABS tablet Take 90 mg by mouth 2 (two) times daily.  2  . cetirizine (ZYRTEC) 10 MG tablet Take 10 mg by mouth daily as needed for allergies.    . clotrimazole-betamethasone (LOTRISONE) cream Apply 1 application topically daily as needed (eczema).    . diphenhydrAMINE (BENADRYL) 25 MG tablet Take 1 tablet (25 mg total) by mouth every 6 (six) hours as needed for itching or allergies. 10 tablet 0  . enalapril (VASOTEC) 10 MG tablet Take 10 mg by mouth 2 (two) times daily.    . Estrogens, Conjugated (PREMARIN VA) Place 1 application vaginally 3 (three) times a week. On Saturdays, Mondays, and Wednesday (cream)    . fentaNYL (DURAGESIC - DOSED MCG/HR) 100 MCG/HR Place 1 patch onto the skin every 3 (three) days.      . Fluocinolone Acetonide 0.01 % OIL  Place 1 drop in ear(s) 2 (two) times daily.    . fluticasone (FLONASE) 50 MCG/ACT nasal spray Place 2 sprays into the nose 2 (two) times daily.      . furosemide (LASIX) 20 MG tablet Take 20 mg by mouth 3 (three) times daily as needed for fluid.    Marland Kitchen gabapentin (NEURONTIN) 100 MG capsule Take 400-700 mg by mouth See admin instructions. Pt takes 400mg  in the morning, 400mg  at lunchtime and 700mg  in the evening    . GLUCOMANNAN PO Take 700 mg by mouth 2 (two) times daily.    . Guaifenesin (MUCINEX MAXIMUM STRENGTH) 1200 MG TB12 Take 1,200 mg by mouth 2 (two) times daily.    . hydroxychloroquine (PLAQUENIL) 200 MG tablet Take 200 mg by mouth 2 (two) times daily.    . insulin regular human CONCENTRATED (HUMULIN R) 500 UNIT/ML SOLN injection Inject 10-40 Units into the skin 3 (three) times daily with meals. Based on sliding scale    . levalbuterol (XOPENEX) 1.25 MG/0.5ML nebulizer solution Take 1 ampule by nebulization 4 (four) times daily as needed for wheezing or shortness of breath.    . levothyroxine (SYNTHROID, LEVOTHROID) 200 MCG tablet Take 200 mcg by mouth daily. Take with 2 25 mcg tablets for a 250 mcg dose    . levothyroxine (SYNTHROID, LEVOTHROID) 25 MCG tablet Take 25 mcg by mouth daily.  0  . magnesium oxide (MAG-OX) 400 MG tablet Take 400 mg by mouth 3 (three) times daily.    . metFORMIN (GLUCOPHAGE) 1000 MG tablet Take 1,000 mg by mouth 2 (two) times daily with a meal.      . metFORMIN (GLUCOPHAGE-XR) 500 MG 24 hr tablet Take 1,000 mg by mouth 2 (two) times daily.  0  . metoCLOPramide (REGLAN) 5 MG tablet Take 1 tablet (5 mg total) by mouth 4 (four) times daily -  before meals and at bedtime. 120 tablet 0  . metoprolol tartrate (LOPRESSOR) 25 MG tablet Take 25 mg by mouth 2 (two) times daily.  1  . milk thistle 175 MG tablet Take 1,000 mg by mouth 2 (two) times daily.    . Multiple Vitamin (MULTIVITAMIN) tablet Take 1 tablet by mouth daily.    . mupirocin cream (BACTROBAN) 2 % Apply 1  application topically daily as needed (eczema).    . niacin (NIASPAN) 500 MG CR tablet Take 1,000 mg by mouth at bedtime.      . Omega-3 Fatty Acids (FISH OIL) 1000 MG CPDR Take 1 capsule by mouth daily.     ondansetron (ZOFRAN ODT) 4 MG disintegrating tablet 4mg  ODT q4 hours prn nausea/vomit 6 tablet 0  . Oxycodone HCl 10 MG TABS Take 10 mg by mouth every 4 (four) hours as needed (for pain).    oxymetazoline (AFRIN) 0.05 % nasal spray Place 1 spray into both nostrils 2 (two) times daily as needed for congestion.    Marland Kitchen Glycol-Propyl Glycol (SYSTANE ULTRA OP) Apply 1 drop to eye daily as needed (dry eyes).    . promethazine (PHENERGAN) 25 MG tablet Take 25 mg by mouth every 6 (six) hours as needed for nausea or vomiting.    . quiNINE (QUALAQUIN) 324 MG capsule Take 648 mg by mouth daily as needed (inflammation).    . saccharomyces boulardii (FLORASTOR) 250 MG capsule Take 1 capsule (250 mg total) by mouth 2 (two) times daily. 30 capsule 0  . vancomycin (VANCOCIN) 125 MG capsule Take 1 capsule (125 mg total) by mouth 4 (four) times daily. For 13 more days 52 capsule 0  . vitamin A Bertram Gala UNIT capsule Take 10,000 Units by mouth daily.    . vitamin B-12 (CYANOCOBALAMIN) 1000 MCG tablet Take 1,000 mcg by mouth daily.      . Vitamin D, Ergocalciferol, (DRISDOL) 50000 units CAPS capsule Take 1 capsule by mouth once a week.  1  . Vitamins-Lipotropics (LIPO-FLAVONOID PLUS PO) Take 1 tablet by mouth 3 (three) times daily.     No current facility-administered medications on file prior to visit.    Allergies  Allergen Reactions  . Fish Allergy Anaphylaxis  . Gabapentin Other (See Comments)    Anxiety on high doses  . Peanut-Containing Drug Products Shortness Of Breath  . Atrovent Other (See Comments)    Wheezing becomes worse  . Crestor [Rosuvastatin] Other (See Comments)    Extreme joint pain Extreme joint pain, to this & other statins  . Ipratropium-Albuterol Other (See Comments)     Wheezing becomes worse, can take albuterol with ipratropium  . Penicillins Other (See Comments)    Unknown childhood reaction   . Suprep [Na Sulfate-K Sulfate-Mg Sulf] Nausea And Vomiting  . Tape     Electrodes causes skin breakdown    Assessment/Plan: 1. Hyperlipidemia: Currently above goal of LDL < 70 mg/dL on no current lipid lowering medication for secondary prevention.  Patient has severe muscle and joint pain on rosuvastatin 40 mg daily, pravastatin, atorvastatin, simvastatin, and Welchol. She also has intolerances to niacin and fish oil. Patients statin intolerance may be exacerbated by her history of mixed connective tissue disease. Patient will go to [facility] labs next Tuesday or Wednesday to get fasting lipids and LFTs.  Will plan to start Praluent 75 mg every 2 weeks pending lipid results. Educated patient on the efficacy/benefits, mechanism of action, and side effects of the PCSK9 inhibitors. Will plan follow-up visit once PCSK9 approved to show administration technique. Counseled patient on diet and low cholesterol diet and encouraged patient to stay with her goal of starting water aerobics and the paleo diet after easter.

## 2015-12-28 ENCOUNTER — Telehealth: Payer: Self-pay | Admitting: Pharmacist

## 2015-12-28 NOTE — Telephone Encounter (Signed)
Called patient to followup her obtaining fasting lipid panel from [facility] labs.  Left message.  Will followup.

## 2016-01-05 NOTE — Telephone Encounter (Signed)
Patient returned call. She has been busy with family issues and will have labs drawn early next week.

## 2016-01-06 ENCOUNTER — Telehealth: Payer: Self-pay | Admitting: *Deleted

## 2016-01-06 DIAGNOSIS — R197 Diarrhea, unspecified: Secondary | ICD-10-CM

## 2016-01-06 NOTE — Telephone Encounter (Signed)
Obtained phone order from Dr. Baxter Flattery for stool culture and C. Diff by PCR.  Pt to bring stool kit to [facility].

## 2016-01-18 ENCOUNTER — Telehealth: Payer: Self-pay | Admitting: *Deleted

## 2016-01-18 NOTE — Telephone Encounter (Signed)
Patient called asking for lab results, stating she "has a cold and thinks her doctor will want her to be on an antibiotic."   Stool test results not yet available on EPIC, obtained per Mariea Clonts.  Results are negative per Dr. Drue Second.  RN left message for patient on her home number. Andree Coss, RN

## 2016-02-04 ENCOUNTER — Emergency Department (HOSPITAL_BASED_OUTPATIENT_CLINIC_OR_DEPARTMENT_OTHER): Payer: BLUE CROSS/BLUE SHIELD

## 2016-02-04 ENCOUNTER — Encounter (HOSPITAL_BASED_OUTPATIENT_CLINIC_OR_DEPARTMENT_OTHER): Payer: Self-pay | Admitting: *Deleted

## 2016-02-04 ENCOUNTER — Emergency Department (HOSPITAL_BASED_OUTPATIENT_CLINIC_OR_DEPARTMENT_OTHER)
Admission: EM | Admit: 2016-02-04 | Discharge: 2016-02-04 | Disposition: A | Discharge status: Home / Self Care | Payer: BLUE CROSS/BLUE SHIELD | Attending: Emergency Medicine | Admitting: Emergency Medicine

## 2016-02-04 DIAGNOSIS — Z87891 Personal history of nicotine dependence: Secondary | ICD-10-CM | POA: Diagnosis not present

## 2016-02-04 DIAGNOSIS — I110 Hypertensive heart disease with heart failure: Secondary | ICD-10-CM | POA: Diagnosis not present

## 2016-02-04 DIAGNOSIS — Z7984 Long term (current) use of oral hypoglycemic drugs: Secondary | ICD-10-CM | POA: Diagnosis not present

## 2016-02-04 DIAGNOSIS — E119 Type 2 diabetes mellitus without complications: Secondary | ICD-10-CM | POA: Diagnosis not present

## 2016-02-04 DIAGNOSIS — Z79899 Other long term (current) drug therapy: Secondary | ICD-10-CM | POA: Insufficient documentation

## 2016-02-04 DIAGNOSIS — W010XXA Fall on same level from slipping, tripping and stumbling without subsequent striking against object, initial encounter: Secondary | ICD-10-CM | POA: Insufficient documentation

## 2016-02-04 DIAGNOSIS — Y929 Unspecified place or not applicable: Secondary | ICD-10-CM | POA: Diagnosis not present

## 2016-02-04 DIAGNOSIS — E079 Disorder of thyroid, unspecified: Secondary | ICD-10-CM | POA: Insufficient documentation

## 2016-02-04 DIAGNOSIS — Y999 Unspecified external cause status: Secondary | ICD-10-CM | POA: Insufficient documentation

## 2016-02-04 DIAGNOSIS — Y939 Activity, unspecified: Secondary | ICD-10-CM | POA: Diagnosis not present

## 2016-02-04 DIAGNOSIS — E785 Hyperlipidemia, unspecified: Secondary | ICD-10-CM | POA: Diagnosis not present

## 2016-02-04 DIAGNOSIS — Z794 Long term (current) use of insulin: Secondary | ICD-10-CM | POA: Diagnosis not present

## 2016-02-04 DIAGNOSIS — S60221A Contusion of right hand, initial encounter: Secondary | ICD-10-CM | POA: Insufficient documentation

## 2016-02-04 DIAGNOSIS — J45909 Unspecified asthma, uncomplicated: Secondary | ICD-10-CM | POA: Diagnosis not present

## 2016-02-04 DIAGNOSIS — I509 Heart failure, unspecified: Secondary | ICD-10-CM | POA: Insufficient documentation

## 2016-02-04 DIAGNOSIS — S6991XA Unspecified injury of right wrist, hand and finger(s), initial encounter: Secondary | ICD-10-CM | POA: Diagnosis present

## 2016-02-04 MED ORDER — IBUPROFEN 400 MG PO TABS: 400.0000 mg | ORAL_TABLET | Freq: Four times a day (QID) | ORAL | Status: DC | PRN

## 2016-02-04 NOTE — ED Provider Notes (Signed)
CSN: [id number]     Arrival date & time 02/04/16  1544 History   First MD Initiated Contact with Patient 02/04/16 1722     Chief Complaint  Patient presents with  . Hand Injury     (Consider location/radiation/quality/duration/timing/severity/associated sxs/prior Treatment) Patient is a 55 y.o. female presenting with hand injury. The history is provided by the patient.  Hand Injury Location:  Hand Time since incident:  1 day Injury: yes   Mechanism of injury: fall   Fall:    Fall occurred:  Standing   Impact surface:  Hard floor   Point of impact:  Outstretched arms   Entrapped after fall: no   Hand location:  R hand Pain details:    Quality:  Aching   Radiates to:  Does not radiate   Severity:  Moderate   Onset quality:  Gradual   Duration:  1 day   Timing:  Constant   Progression:  Unchanged Chronicity:  New Handedness:  Right-handed Dislocation: no   Prior injury to area:  Yes (remote fracture) Relieved by:  Nothing Worsened by:  Movement (grip) Ineffective treatments:  None tried Associated symptoms: stiffness   Associated symptoms: no decreased range of motion, no muscle weakness, no numbness and no swelling     Past Medical History  Diagnosis Date  . Asthma   . Hypertension   . Diabetes mellitus   . IBS (irritable bowel syndrome)   . GERD (gastroesophageal reflux disease)   . Eczema   . Thyroid disease   . Mixed connective tissue disease (HCC)   . C. difficile diarrhea   . Hyperlipidemia   . CHF (congestive heart failure) (HCC) 09/2015    cath with normal LM, 30% LAD, 85% mid RCA and 95% distal RCA s/p PCI of the mid to distal RCA and now on DAPT with ASA and Ticagrelor.    . Excessive daytime sleepiness 11/26/2015   Past Surgical History  Procedure Laterality Date  . Breast surgery    . Cholecystectomy    . Abdominal hysterectomy    . Appendectomy    . Leg surgery Left     muscle biospy  . Wrist surgery Left   . Cardiac catheterization    .  Coronary angioplasty      normal LM, 30% LAD, 85% mid RCA and 95% distal RCA s/p PCI of the mid to distal RCA and now on DAPT with ASA and Ticagrelor.     Family History  Problem Relation Age of Onset  . CAD Mother   . Hypertension Mother   . CAD Father   . Heart attack Mother   . Heart attack Father   . Hypertension Brother   . Hypertension Brother   . Pancreatic cancer Paternal Aunt   . Breast cancer Paternal Aunt    Social History  Substance Use Topics  . Smoking status: Former Smoker    Quit date: 02/11/2004  . Smokeless tobacco: None  . Alcohol Use: Yes     Comment: once a month   OB History    No data available     Review of Systems  Musculoskeletal: Positive for stiffness.  All other systems reviewed and are negative.     Allergies  Fish allergy; Fish oil; Gabapentin; Peanut-containing drug products; Red yeast rice; Atrovent; Crestor; Ipratropium-albuterol; Penicillins; Suprep; and Tape  Home Medications   Prior to Admission medications   Medication Sig Start Date End Date Taking? Authorizing Provider  albuterol (PROVENTIL HFA;VENTOLIN HFA) 108 (  90 BASE) MCG/ACT inhaler Inhale 2 puffs into the lungs every 4 (four) hours as needed for wheezing or shortness of breath. 11/14/12   Richarda Overlie, MD  albuterol (PROVENTIL) (2.5 MG/3ML) 0.083% nebulizer solution Inhale 2.5 mLs into the lungs as directed. 09/24/15   Historical Provider, MD  Alpha Lipoic Acid 200 MG CAPS Take 600 mg by mouth 3 (three) times daily.    Historical Provider, MD  aspirin EC 81 MG tablet Take 81 mg by mouth daily.    Historical Provider, MD  b complex vitamins tablet Take 1 tablet by mouth daily.    Historical Provider, MD  betamethasone dipropionate (DIPROLENE) 0.05 % cream Apply 1 application topically daily as needed (eczema).    Historical Provider, MD  BRILINTA 90 MG TABS tablet Take 90 mg by mouth 2 (two) times daily. 11/25/15   Historical Provider, MD  cetirizine (ZYRTEC) 10 MG tablet Take  10 mg by mouth daily as needed for allergies.    Historical Provider, MD  clotrimazole-betamethasone (LOTRISONE) cream Apply 1 application topically daily as needed (eczema).    Historical Provider, MD  diphenhydrAMINE (BENADRYL) 25 MG tablet Take 1 tablet (25 mg total) by mouth every 6 (six) hours as needed for itching or allergies. 04/05/13   Fayrene Helper, PA-C  enalapril (VASOTEC) 10 MG tablet Take 10 mg by mouth 2 (two) times daily.    Historical Provider, MD  EPINEPHrine 0.3 mg/0.3 mL IJ SOAJ injection Inject 0.3 mg into the muscle once as needed.    Historical Provider, MD  Estrogens, Conjugated (PREMARIN VA) Place 1 application vaginally 3 (three) times a week. On Saturdays, Mondays, and Wednesday (cream)    Historical Provider, MD  fentaNYL (DURAGESIC - DOSED MCG/HR) 100 MCG/HR Place 1 patch onto the skin every 3 (three) days.      Historical Provider, MD  Fluocinolone Acetonide 0.01 % OIL Place 1 drop in ear(s) 2 (two) times daily.    Historical Provider, MD  fluticasone (FLONASE) 50 MCG/ACT nasal spray Place 2 sprays into the nose 2 (two) times daily.      Historical Provider, MD  furosemide (LASIX) 20 MG tablet Take 20 mg by mouth 3 (three) times daily as needed for fluid.    Historical Provider, MD  gabapentin (NEURONTIN) 100 MG capsule Take 400-700 mg by mouth See admin instructions. Pt takes 400mg  in the morning, 400mg  at lunchtime and 700mg  in the evening    Historical Provider, MD  GLUCOMANNAN PO Take 700 mg by mouth 2 (two) times daily.    Historical Provider, MD  Guaifenesin (MUCINEX MAXIMUM STRENGTH) 1200 MG TB12 Take 1,200 mg by mouth 2 (two) times daily.    Historical Provider, MD  hydroxychloroquine (PLAQUENIL) 200 MG tablet Take 200 mg by mouth 2 (two) times daily.    Historical Provider, MD  ibuprofen (ADVIL,MOTRIN) 400 MG tablet Take 1 tablet (400 mg total) by mouth every 6 (six) hours as needed. 02/04/16   , MD  insulin regular human CONCENTRATED (HUMULIN R) 500  UNIT/ML SOLN injection Inject 10-40 Units into the skin 3 (three) times daily with meals. Based on sliding scale    Historical Provider, MD  levalbuterol (XOPENEX) 1.25 MG/0.5ML nebulizer solution Take 1 ampule by nebulization 4 (four) times daily as needed for wheezing or shortness of breath.    Historical Provider, MD  levothyroxine (SYNTHROID, LEVOTHROID) 200 MCG tablet Take 200 mcg by mouth daily. Take with 2 25 mcg tablets for a 250 mcg dose  Historical Provider, MD  levothyroxine (SYNTHROID, LEVOTHROID) 25 MCG tablet Take 25 mcg by mouth daily. 10/20/15   Historical Provider, MD  magnesium oxide (MAG-OX) 400 MG tablet Take 400 mg by mouth 3 (three) times daily.    Historical Provider, MD  metFORMIN (GLUCOPHAGE-XR) 500 MG 24 hr tablet Take 1,000 mg by mouth 2 (two) times daily. 11/25/15   Historical Provider, MD  metoCLOPramide (REGLAN) 5 MG tablet Take 1 tablet (5 mg total) by mouth 4 (four) times daily -  before meals and at bedtime. 02/13/15   Osvaldo Shipper, MD  metoprolol tartrate (LOPRESSOR) 25 MG tablet Take 25 mg by mouth 2 (two) times daily. 11/25/15   Historical Provider, MD  milk thistle 175 MG tablet Take 1,000 mg by mouth 2 (two) times daily.    Historical Provider, MD  Multiple Vitamin (MULTIVITAMIN) tablet Take 1 tablet by mouth daily.    Historical Provider, MD  mupirocin cream (BACTROBAN) 2 % Apply 1 application topically daily as needed (eczema).    Historical Provider, MD  ondansetron (ZOFRAN ODT) 4 MG disintegrating tablet 4mg  ODT q4 hours prn nausea/vomit 08/01/13   08/03/13, PA-C  Oxycodone HCl 10 MG TABS Take 10 mg by mouth every 4 (four) hours as needed (for pain).    Historical Provider, MD  oxymetazoline (AFRIN) 0.05 % nasal spray Place 1 spray into both nostrils 2 (two) times daily as needed for congestion.    Historical Provider, MD  Polyethyl Glycol-Propyl Glycol (SYSTANE ULTRA OP) Apply 1 drop to eye daily as needed (dry eyes).    Historical Provider, MD   promethazine (PHENERGAN) 25 MG tablet Take 25 mg by mouth every 6 (six) hours as needed for nausea or vomiting.    Historical Provider, MD  quiNINE (QUALAQUIN) 324 MG capsule Take 648 mg by mouth daily as needed (inflammation).    Historical Provider, MD  saccharomyces boulardii (FLORASTOR) 250 MG capsule Take 1 capsule (250 mg total) by mouth 2 (two) times daily. 02/13/15   04/15/15, MD  vitamin A Osvaldo Shipper UNIT capsule Take 10,000 Units by mouth daily.    Historical Provider, MD  vitamin B-12 (CYANOCOBALAMIN) 1000 MCG tablet Take 1,000 mcg by mouth daily.      Historical Provider, MD  Vitamin D, Ergocalciferol, (DRISDOL) 50000 units CAPS capsule Take 1 capsule by mouth once a week. 11/25/15   Historical Provider, MD  Vitamins-Lipotropics (LIPO-FLAVONOID PLUS PO) Take 1 tablet by mouth 3 (three) times daily.    Historical Provider, MD   BP 139/68 mmHg  Pulse 88  Temp(Src) 98 F (36.7 C) (Oral)  Resp 20  Ht 5\' 2"  (1.575 m)  Wt 265 lb (120.203 kg)  BMI 48.46 kg/m2  SpO2 97% Physical Exam  Constitutional: She is oriented to person, place, and time. She appears well-developed and well-nourished. No distress.  HENT:  Head: Normocephalic.  Eyes: Conjunctivae are normal.  Neck: Neck supple. No tracheal deviation present.  Cardiovascular: Normal rate and regular rhythm.   Pulmonary/Chest: Effort normal. No respiratory distress.  Abdominal: Soft. She exhibits no distension.  Musculoskeletal:       Right hand: She exhibits tenderness (over hand dorsum). She exhibits normal range of motion, normal two-point discrimination, normal capillary refill, no deformity and no swelling. Normal sensation noted. Normal strength noted. She exhibits no finger abduction, no thumb/finger opposition and no wrist extension trouble.  Neurological: She is alert and oriented to person, place, and time.  Skin: Skin is warm and dry.  Psychiatric: She has  a normal mood and affect.    ED Course  Procedures  (including critical care time) Labs Review Labs Reviewed - No data to display  Imaging Review Dg Hand Complete Right  02/04/2016  CLINICAL DATA:  Initial encounter for Injury to RIGHT hand x 1 day ago. Pt fell going into storage shed. C/O pain to dorsal side of hand in the mid metacarpal area. EXAM: RIGHT HAND - COMPLETE 3+ VIEW COMPARISON:  None. FINDINGS: No acute fracture or dislocation.  No definite soft tissue swelling. IMPRESSION: No acute osseous abnormality. Electronically Signed   By: Jeronimo Greaves M.D.   On: 02/04/2016 16:18   I have personally reviewed and evaluated these images and lab results as part of my medical decision-making.   EKG Interpretation None      MDM   Final diagnoses:  Hand contusion, right, initial encounter    55 y.o. female presents with Right hand pain after a fall from standing yesterday when she slipped during a rainstorm. She has no external signs of injury and x-rays negative for acute bony abnormality, suspect uncomplicated contusion. Pt given instructions for supportive care including NSAIDs, rest, ice, compression, and elevation to help alleviate symptoms.   Lyndal Pulley, MD 02/04/16 [phone]

## 2016-02-04 NOTE — Discharge Instructions (Signed)
Contusion °A contusion is a deep bruise. Contusions are the result of a blunt injury to tissues and muscle fibers under the skin. The injury causes bleeding under the skin. The skin overlying the contusion may turn blue, purple, or yellow. Minor injuries will give you a painless contusion, but more severe contusions may stay painful and swollen for a few weeks.  °CAUSES  °This condition is usually caused by a blow, trauma, or direct force to an area of the body. °SYMPTOMS  °Symptoms of this condition include: °· Swelling of the injured area. °· Pain and tenderness in the injured area. °· Discoloration. The area may have redness and then turn blue, purple, or yellow. °DIAGNOSIS  °This condition is diagnosed based on a physical exam and medical history. An X-ray, CT scan, or MRI may be needed to determine if there are any associated injuries, such as broken bones (fractures). °TREATMENT  °Specific treatment for this condition depends on what area of the body was injured. In general, the best treatment for a contusion is resting, icing, applying pressure to (compression), and elevating the injured area. This is often called the RICE strategy. Over-the-counter anti-inflammatory medicines may also be recommended for pain control.  °HOME CARE INSTRUCTIONS  °· Rest the injured area. °· If directed, apply ice to the injured area: °· Put ice in a plastic bag. °· Place a towel between your skin and the bag. °· Leave the ice on for 20 minutes, 2-3 times per day. °· If directed, apply light compression to the injured area using an elastic bandage. Make sure the bandage is not wrapped too tightly. Remove and reapply the bandage as directed by your health care provider. °· If possible, raise (elevate) the injured area above the level of your heart while you are sitting or lying down. °· Take over-the-counter and prescription medicines only as told by your health care provider. °SEEK MEDICAL CARE IF: °· Your symptoms do not  improve after several days of treatment. °· Your symptoms get worse. °· You have difficulty moving the injured area. °SEEK IMMEDIATE MEDICAL CARE IF:  °· You have severe pain. °· You have numbness in a hand or foot. °· Your hand or foot turns pale or cold. °  °This information is not intended to replace advice given to you by your health care provider. Make sure you discuss any questions you have with your health care provider. °  °Document Released: 06/07/2005 Document Revised: 05/19/2015 Document Reviewed: 01/13/2015 °[employer]vier Interactive Patient Education ©2016 [employer] °RICE for Routine Care of Injuries °The routine care of many injuries includes rest, ice, compression, and elevation (RICE therapy). RICE therapy is often recommended for injuries to soft tissues, such as a muscle strain, ligament injuries, bruises, and overuse injuries. It can also be used for some bony injuries. Using RICE therapy can help to relieve pain, lessen swelling, and enable your body to heal. °Rest °Rest is required to allow your body to heal. This usually involves reducing your normal activities and avoiding use of the injured part of your body. Generally, you can return to your normal activities when you are comfortable and have been given permission by your health care provider. °Ice °Icing your injury helps to keep the swelling down, and it lessens pain. Do not apply ice directly to your skin. °· Put ice in a plastic bag. °· Place a towel between your skin and the bag. °· Leave the ice on for 20 minutes, 2-3 times a day. °Do this for as long as you   are directed by your health care provider. °Compression °Compression means putting pressure on the injured area. Compression helps to keep swelling down, gives support, and helps with discomfort. Compression may be done with an elastic bandage. If an elastic bandage has been applied, follow these general tips: °· Remove and reapply the bandage every 3-4 hours or as directed by your  health care provider. °· Make sure the bandage is not wrapped too tightly, because this can cut off circulation. If part of your body beyond the bandage becomes blue, numb, cold, swollen, or more painful, your bandage is most likely too tight. If this occurs, remove your bandage and reapply it more loosely. °· See your health care provider if the bandage seems to be making your problems worse rather than better. °Elevation °Elevation means keeping the injured area raised. This helps to lessen swelling and decrease pain. If possible, your injured area should be elevated at or above the level of your heart or the center of your chest. °WHEN SHOULD I SEEK MEDICAL CARE? °You should seek medical care if: °· Your pain and swelling continue. °· Your symptoms are getting worse rather than improving. °These symptoms may indicate that further evaluation or further X-rays are needed. Sometimes, X-rays may not show a small broken bone (fracture) until a number of days later. Make a follow-up appointment with your health care provider. °WHEN SHOULD I SEEK IMMEDIATE MEDICAL CARE? °You should seek immediate medical care if: °· You have sudden severe pain at or below the area of your injury. °· You have redness or increased swelling around your injury. °· You have tingling or numbness at or below the area of your injury that does not improve after you remove the elastic bandage. °  °This information is not intended to replace advice given to you by your health care provider. Make sure you discuss any questions you have with your health care provider. °  °Document Released: 12/10/2000 Document Revised: 05/19/2015 Document Reviewed: 08/05/2014 °[employer]vier Interactive Patient Education ©2016 [employer] ° °

## 2016-02-04 NOTE — ED Notes (Signed)
Pt c/o right hand injury x 1 day ago , also c/o right knee

## 2016-02-10 ENCOUNTER — Ambulatory Visit (INDEPENDENT_AMBULATORY_CARE_PROVIDER_SITE_OTHER): Payer: BLUE CROSS/BLUE SHIELD | Admitting: Pediatrics

## 2016-02-10 ENCOUNTER — Encounter: Payer: Self-pay | Admitting: Pediatrics

## 2016-02-10 VITALS — BP 130/96 | HR 88 | Temp 98.0°F | Resp 20 | Ht 62.2 in | Wt 270.2 lb

## 2016-02-10 DIAGNOSIS — Z87898 Personal history of other specified conditions: Secondary | ICD-10-CM | POA: Diagnosis not present

## 2016-02-10 DIAGNOSIS — D849 Immunodeficiency, unspecified: Secondary | ICD-10-CM | POA: Diagnosis not present

## 2016-02-10 DIAGNOSIS — T7800XA Anaphylactic reaction due to unspecified food, initial encounter: Secondary | ICD-10-CM

## 2016-02-10 DIAGNOSIS — K219 Gastro-esophageal reflux disease without esophagitis: Secondary | ICD-10-CM | POA: Diagnosis not present

## 2016-02-10 DIAGNOSIS — I2510 Atherosclerotic heart disease of native coronary artery without angina pectoris: Secondary | ICD-10-CM

## 2016-02-10 DIAGNOSIS — Z79899 Other long term (current) drug therapy: Secondary | ICD-10-CM

## 2016-02-10 DIAGNOSIS — E1169 Type 2 diabetes mellitus with other specified complication: Secondary | ICD-10-CM | POA: Insufficient documentation

## 2016-02-10 DIAGNOSIS — J4550 Severe persistent asthma, uncomplicated: Secondary | ICD-10-CM

## 2016-02-10 DIAGNOSIS — E1165 Type 2 diabetes mellitus with hyperglycemia: Secondary | ICD-10-CM | POA: Insufficient documentation

## 2016-02-10 DIAGNOSIS — Z8739 Personal history of other diseases of the musculoskeletal system and connective tissue: Secondary | ICD-10-CM | POA: Insufficient documentation

## 2016-02-10 DIAGNOSIS — E118 Type 2 diabetes mellitus with unspecified complications: Secondary | ICD-10-CM

## 2016-02-10 DIAGNOSIS — L209 Atopic dermatitis, unspecified: Secondary | ICD-10-CM

## 2016-02-10 LAB — CBC WITH DIFFERENTIAL/PLATELET
Basophils Absolute: 89 cells/uL (ref 0–200)
Basophils Relative: 1 %
Eosinophils Absolute: 89 cells/uL (ref 15–500)
Eosinophils Relative: 1 %
HCT: 44.7 % (ref 35.0–45.0)
Hemoglobin: 14.3 g/dL (ref 11.7–15.5)
Lymphocytes Relative: 34 %
Lymphs Abs: 3026 cells/uL (ref 850–3900)
MCH: 28.3 pg (ref 27.0–33.0)
MCHC: 32 g/dL (ref 32.0–36.0)
MCV: 88.3 fL (ref 80.0–100.0)
MPV: 8.9 fL (ref 7.5–12.5)
Monocytes Absolute: 712 cells/uL (ref 200–950)
Monocytes Relative: 8 %
Neutro Abs: 4984 cells/uL (ref 1500–7800)
Neutrophils Relative %: 56 %
Platelets: 225 10*3/uL (ref 140–400)
RBC: 5.06 MIL/uL (ref 3.80–5.10)
RDW: 14.3 % (ref 11.0–15.0)
WBC: 8.9 10*3/uL (ref 3.8–10.8)

## 2016-02-10 MED ORDER — EPINEPHRINE 0.3 MG/0.3ML IJ SOAJ: INTRAMUSCULAR | Status: DC

## 2016-02-10 MED ORDER — BECLOMETHASONE DIPROPIONATE 40 MCG/ACT IN AERS: INHALATION_SPRAY | RESPIRATORY_TRACT | Status: DC

## 2016-02-10 NOTE — Progress Notes (Signed)
[address] [place] [address] Dept: [phone]  New Patient Note  Patient ID: [name], female    DOB: Mar 15, 1961  Age: 55 y.o. MRN: [id number] Date of Office Visit: 02/10/2016 Referring provider: Cheral Bay, MD [address] [place], [place] [address]    Chief Complaint: Allergy Testing  HPI [name] presents for evaluation of allergic reactions. 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 She can eat flounder, pollock, salmon, white fish. Shrimp gives her an irritated feeling in her throat.. She has a history of asthma since infancy. She quit smoking cigarettes 12 years ago after smoking cigarettes for about 20 years averaging about a pack a day. She has had pneumonia at least 10 times in the past. Ipratropium by inhalation gave her shortness of breath and she was told to avoid peanuts but she can eat peanuts without any problems  She was told as a child not to take penicillin but she does not recall taking it and having an allergic reaction. Her family doctor would like to see if she is allergic to penicillin because she had a lung infection last year for 3 weeks which was complicated by C. difficile. She also has had nasal congestion for several years aggravated by exposure to dust, cigarette smoke, cats, the springtime and possibly dogs.  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  Review of Systems  Constitutional: Negative for malaise/fatigue.  HENT:       Nasal congestion for several years  Eyes: Negative.   Respiratory:       Asthma since early infancy. Over 10 episodes of pneumonia in the past  Cardiovascular:       Hypertension. Coronary artery disease with stents in January of this year  Gastrointestinal:   Gastroesophageal reflux Cholecystectomy. Appendectomy  Genitourinary:       Hysterectomy  Musculoskeletal:       Mixed  connective-tissue disease.Aching  of muscles and joints  Skin:       History of eczema  Neurological: Negative for weakness.       Generalized pain and paresthesias  Endo/Heme/Allergies:       Hypothyroidism. Type 2 diabetes requiring insulin. Multiple paresthesias and pain  Psychiatric/Behavioral: Negative.     Outpatient Encounter Prescriptions as of 02/10/2016  Medication Sig  . albuterol (PROVENTIL HFA;VENTOLIN HFA) 108 (90 BASE) MCG/ACT inhaler Inhale 2 puffs into the lungs every 4 (four) hours as needed for wheezing or shortness of breath.  Marland Kitchen albuterol (PROVENTIL) (2.5 MG/3ML) 0.083% nebulizer solution Inhale 2.5 mLs into the lungs as directed.  Allen Kell Lipoic Acid 200 MG CAPS Take 600 mg by mouth 3 (three) times daily.  Marland Kitchen aspirin EC 81 MG tablet Take 81 mg by mouth daily.  Marland Kitchen b complex vitamins tablet Take 1 tablet by mouth daily.  . betamethasone dipropionate (DIPROLENE) 0.05 % cream Apply 1 application topically daily as needed (eczema).  . BRILINTA 90 MG TABS tablet Take 90 mg by mouth 2 (two) times daily.  . cetirizine (ZYRTEC) 10 MG tablet Take 10 mg by mouth daily as needed for allergies.  . clotrimazole-betamethasone (LOTRISONE) cream Apply 1 application topically daily as needed (eczema).  . diphenhydrAMINE (BENADRYL) 25 MG tablet Take 1 tablet (25 mg total) by mouth every 6 (six) hours as needed for itching or allergies.  Marland Kitchen  enalapril (VASOTEC) 10 MG tablet Take 10 mg by mouth 2 (two) times daily.  Marland Kitchen EPINEPHrine 0.3 mg/0.3 mL IJ SOAJ injection Inject 0.3 mg into the muscle once as needed.  . Estrogens, Conjugated (PREMARIN VA) Place 1 application vaginally 3 (three) times a week. On Saturdays, Mondays, and Wednesday (cream)  . fentaNYL (DURAGESIC - DOSED MCG/HR) 100 MCG/HR Place 1 patch onto the skin every 3 (three) days.    . fluticasone (FLONASE) 50  MCG/ACT nasal spray Place 2 sprays into the nose 2 (two) times daily.    . furosemide (LASIX) 20 MG tablet Take 20 mg by mouth 3 (three) times daily as needed for fluid.  . furosemide (LASIX) 40 MG tablet Take 40 mg by mouth.  . gabapentin (NEURONTIN) 100 MG capsule Take 400-700 mg by mouth See admin instructions. Pt takes 400mg  in the morning, 400mg  at lunchtime and 700mg  in the evening  . GLUCOMANNAN PO Take 700 mg by mouth 2 (two) times daily.  glucose blood (BAYER CONTOUR TEST) test strip   . Guaifenesin (MUCINEX MAXIMUM STRENGTH) 1200 MG TB12 Take 1,200 mg by mouth 2 (two) times daily.  . hydroxychloroquine (PLAQUENIL) 200 MG tablet Take 200 mg by mouth 2 (two) times daily.  ibuprofen (ADVIL,MOTRIN) 400 MG tablet Take 1 tablet (400 mg total) by mouth every 6 (six) hours as needed.  . insulin regular human CONCENTRATED (HUMULIN R) 500 UNIT/ML SOLN injection Inject 10-40 Units into the skin 3 (three) times daily with meals. Based on sliding scale  . Insulin Syringe-Needle U-100 (EASY TOUCH INSULIN SYRINGE) 30G X 5/16" 0.5 ML MISC   . levalbuterol (XOPENEX) 1.25 MG/0.5ML nebulizer solution Take 1 ampule by nebulization 4 (four) times daily as needed for wheezing or shortness of breath.  . levothyroxine (SYNTHROID, LEVOTHROID) 200 MCG tablet Take 200 mcg by mouth daily. Take with 2 25 mcg tablets for a 250 mcg dose  . levothyroxine (SYNTHROID, LEVOTHROID) 25 MCG tablet Take 25 mcg by mouth daily.  . magnesium oxide (MAG-OX) 400 MG tablet Take 400 mg by mouth 3 (three) times daily.  . metFORMIN (GLUCOPHAGE-XR) 500 MG 24 hr tablet Take 1,000 mg by mouth 2 (two) times daily.  . metoCLOPramide (REGLAN) 5 MG tablet Take 1 tablet (5 mg total) by mouth 4 (four) times daily -  before meals and at bedtime.  . metoprolol tartrate (LOPRESSOR) 25 MG tablet Take 25 mg by mouth 2 (two) times daily.  . milk thistle 175 MG tablet Take 1,000 mg by mouth 2 (two) times daily.  . Multiple Vitamin (MULTIVITAMIN)  tablet Take 1 tablet by mouth daily.  . mupirocin cream (BACTROBAN) 2 % Apply 1 application topically daily as needed (eczema).  Marland Kitchen omeprazole (PRILOSEC) 20 MG capsule Take 20 mg by mouth.  . ondansetron (ZOFRAN) 4 MG tablet Take 4 mg by mouth.  . Oxycodone HCl 10 MG TABS Take 10 mg by mouth every 4 (four) hours as needed (for pain).  Marland Kitchen oxymetazoline (AFRIN) 0.05 % nasal spray Place 1 spray into both nostrils 2 (two) times daily as needed for congestion.  6/16 Glycol-Propyl Glycol (SYSTANE ULTRA OP) Apply 1 drop to eye daily as needed (dry eyes).  . promethazine (PHENERGAN) 25 MG tablet Take 25 mg by mouth every 6 (six) hours as needed for nausea or vomiting.  . quiNINE (QUALAQUIN) 324 MG capsule Take 648 mg by mouth daily as needed (inflammation).  . saccharomyces boulardii (FLORASTOR) 250 MG capsule Take 1 capsule (250 mg total)  by mouth 2 (two) times daily.  . vitamin A 97673 UNIT capsule Take 10,000 Units by mouth daily.  . vitamin B-12 (CYANOCOBALAMIN) 1000 MCG tablet Take 1,000 mcg by mouth daily.    . Vitamin D, Ergocalciferol, (DRISDOL) 50000 units CAPS capsule Take 1 capsule by mouth once a week.  . Vitamins-Lipotropics (LIPO-FLAVONOID PLUS PO) Take 1 tablet by mouth 3 (three) times daily.  . [DISCONTINUED] ondansetron (ZOFRAN ODT) 4 MG disintegrating tablet 4mg  ODT q4 hours prn nausea/vomit  . beclomethasone (QVAR) 40 MCG/ACT inhaler ONE PUFF TWICE A DAY TO PREVENT COUGH OR WHEEZE. USE SPACER.  Marland Kitchen EPINEPHrine (EPIPEN 2-PAK) 0.3 mg/0.3 mL IJ SOAJ injection USE AS DIRECTED FOR SEVERE ALLERGIC REACTION.  . [DISCONTINUED] aspirin EC 81 MG tablet Take 81 mg by mouth.  . [DISCONTINUED] betamethasone dipropionate (DIPROLENE) 0.05 % ointment Apply topically.  . [DISCONTINUED] cetirizine (ZYRTEC) 10 MG tablet Take 10 mg by mouth.  . [DISCONTINUED] conjugated estrogens (PREMARIN) vaginal cream Place 1 g vaginally.  . [DISCONTINUED] enalapril (VASOTEC) 10 MG tablet Take 10 mg by mouth.  .  [DISCONTINUED] fentaNYL (DURAGESIC - DOSED MCG/HR) 100 MCG/HR Place onto the skin.  . [DISCONTINUED] Fluocinolone Acetonide 0.01 % OIL Place 1 drop in ear(s) 2 (two) times daily. Reported on 02/10/2016  . [DISCONTINUED] Fluocinolone Acetonide 0.01 % OIL Place in ear(s).  . [DISCONTINUED] fluticasone (FLONASE) 50 MCG/ACT nasal spray Place into the nose.  . [DISCONTINUED] gabapentin (NEURONTIN) 100 MG capsule Take 300 mg by mouth.  . [DISCONTINUED] hydroxychloroquine (PLAQUENIL) 200 MG tablet Take 200 mg by mouth.  . [DISCONTINUED] levalbuterol (XOPENEX) 1.25 MG/3ML nebulizer solution 1.25 mg.  . [DISCONTINUED] levalbuterol (XOPENEX) 1.25 MG/3ML nebulizer solution   . [DISCONTINUED] magnesium oxide (MAG-OX) 400 MG tablet Take 400 mg by mouth.  . [DISCONTINUED] mupirocin cream (BACTROBAN) 2 % Apply topically.  . [DISCONTINUED] Oxycodone HCl 10 MG TABS Take 10 mg by mouth.  . [DISCONTINUED] predniSONE (STERAPRED UNI-PAK 21 TAB) 5 MG (21) TBPK tablet   . [DISCONTINUED] Vitamin D, Ergocalciferol, (DRISDOL) 50000 units CAPS capsule Take by mouth.   No facility-administered encounter medications on file as of 02/10/2016.     Drug Allergies:  Allergies  Allergen Reactions  . Fish Allergy Anaphylaxis  . Fish Oil [Omega-3 Fatty Acids] Swelling    Throat swelling   . Gabapentin Other (See Comments)    Anxiety on high doses  . Ipratropium Shortness Of Breath  . Ipratropium-Albuterol Other (See Comments) and Shortness Of Breath    Wheezing becomes worse, can take albuterol with ipratropium  . Peanut-Containing Drug Products Shortness Of Breath  . Red Yeast Rice [Cholestin] Other (See Comments)    Muscle pain and severe joint pain.   . Atrovent Other (See Comments)    Wheezing becomes worse  . Crestor [Rosuvastatin] Other (See Comments)    Extreme joint pain Extreme joint pain, to this & other statins  . Suprep [Na Sulfate-K Sulfate-Mg Sulf] Nausea And Vomiting  . Tape     Electrodes causes skin  breakdown  . Penicillins Other (See Comments) and Rash    Unknown childhood reaction     Family History: Elna's family history includes Allergic rhinitis in her father; Asthma in her father; Breast cancer in her paternal aunt; CAD in her father and mother; Heart attack in her father and mother; Hypertension in her brother, brother, and mother; Pancreatic cancer in her paternal aunt..  Social and environmental.. She has a poodle in the home. She is not exposed to cigarette smoking.  She quit smoking cigarettes 12 years ago after smoking cigarettes for 20 years averaging around a pack a day. 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  Physical Exam: BP 130/96 mmHg  Pulse 88  Temp(Src) 98 F (36.7 C) (Oral)  Resp 20  Ht 5' 2.2" (1.58 m)  Wt 270 lb 3.2 oz (122.562 kg)  BMI 49.10 kg/m2  SpO2 95%   Physical Exam  Constitutional: She is oriented to person, place, and time. She appears well-developed and well-nourished.  Obese  HENT:  Eyes normal. Ears normal. Nose mild swelling of nasal turbinates. Pharynx normal.  Neck: Neck supple. No thyromegaly present.  Cardiovascular:  S1 and S2 normal no murmurs  Pulmonary/Chest:  Clear to percussion and auscultation  Abdominal: Soft. There is no tenderness (no hepatosplenomegaly).  Lymphadenopathy:    She has no cervical adenopathy.  Neurological: She is alert and oriented to person, place, and time.  Skin:  Clear except for a few bruises from her medications  Psychiatric: She has a normal mood and affect. Her behavior is normal. Judgment and thought content normal.  Vitals reviewed.   Diagnostics: FVC 1.60 L FEV1 1.35 L. Predicted FVC 3.19 L predicted from 2.51 L. After albuterol 2 puffs FVC 1.57 L FEV1 1.53 L-this shows a severe reduction in the forced vital capacity. The FEV1 did improve 13% after albuterol   Assessment Assessment and Plan: 1. Anaphylactic reaction due to food, initial encounter   2. Severe persistent asthma,  uncomplicated   3. H/O mixed connective tissue disease   4. Type 2 diabetes mellitus with complication, unspecified long term insulin use status (HCC)   5. Current use of beta blocker   6. Coronary artery disease involving native coronary artery of native heart without angina pectoris   7. Gastroesophageal reflux disease without esophagitis   8. Immunodeficiency (HCC)   9. Atopic eczema     Meds ordered this encounter  Medications  . beclomethasone (QVAR) 40 MCG/ACT inhaler    Sig: ONE PUFF TWICE A DAY TO PREVENT COUGH OR WHEEZE. USE SPACER.    Dispense:  1 Inhaler    Refill:  5  . EPINEPHrine (EPIPEN 2-PAK) 0.3 mg/0.3 mL IJ SOAJ injection    Sig: USE AS DIRECTED FOR SEVERE ALLERGIC REACTION.    Dispense:  2 Device    Refill:  2    Patient Instructions  Stop Zyrtec for 3 days before testing Proventil 2 puffs every 4 hours if needed for wheezing or coughing spells Fluticasone 2 sprays per nostril once a day if needed for stuffy nose Qvar 40 one puff twice a day to prevent coughing or wheezing. Then rinse gargle and spit out  If you have an allergic reaction take Benadryl 4 teaspoonfuls every 4 hours and if you have life-threatening symptoms inject with EpiPen 0.3 mg    Return in about 5 days (around 02/15/2016).   Thank you for the opportunity to care for this patient.  Please do not hesitate to contact me with questions.  Tonette Bihari, M.D.  [facility] [address] [place], [place] [address] [phone]

## 2016-02-10 NOTE — Patient Instructions (Addendum)
Stop Zyrtec for 3 days before testing Proventil 2 puffs every 4 hours if needed for wheezing or coughing spells Fluticasone 2 sprays per nostril once a day if needed for stuffy nose Qvar 40 one puff twice a day to prevent coughing or wheezing. Then rinse gargle and spit out  If you have an allergic reaction take Benadryl 4 teaspoonfuls every 4 hours and if you have life-threatening symptoms inject with EpiPen 0.3 mg

## 2016-02-11 LAB — ALLERGEN, FISH, COD F3: Fish Cod: <0.1 kU/L

## 2016-02-11 LAB — IGG, IGA, IGM
IgA: 429 mg/dL (ref 81–463)
IgG (Immunoglobin G), Serum: 646 mg/dL — ABNORMAL LOW (ref 694–1618)
IgM, Serum: 26 mg/dL — ABNORMAL LOW (ref 48–271)

## 2016-02-11 LAB — ALLERGEN, PEANUT F13: Peanut IgE: 0.3 kU/L — ABNORMAL HIGH

## 2016-02-11 LAB — ALLERGY-SHELLFISH PANEL
Clams: <0.1 kU/L
Crab: 0.51 kU/L — ABNORMAL HIGH
Lobster: 0.35 kU/L — ABNORMAL HIGH
Shrimp IgE: 0.56 kU/L — ABNORMAL HIGH

## 2016-02-11 LAB — IGE: IgE (Immunoglobulin E), Serum: 257 kU/L — ABNORMAL HIGH (ref <115)

## 2016-02-11 LAB — CP658 FISH PANEL
Allergen, Flounder, Rf337: <0.1 kU/L
Allergen, Salmon, f41: <0.1 kU/L
Allergen, Trout, f204: <0.1 kU/L
Allergen,Halibut,Rf303: <0.1 kU/L
Allergen,Mackerel,Rf206: <0.1 kU/L
Fish Cod: <0.1 kU/L
Tuna IgE: <0.1 kU/L

## 2016-02-11 LAB — ALLERGEN, PEANUT COMPONENT PANEL
Ara h 1 (f422): <0.1 kU/L
Ara h 2 (f423): <0.1 kU/L
Ara h 3 (f424): <0.1 kU/L
Ara h 8 (f352): <0.1 kU/L
Ara h 9 (f427: 0.15 kU/L — ABNORMAL HIGH

## 2016-02-11 LAB — ALLERGEN, RICE, F9: Allergen, Rice, f9: 0.38 kU/L — ABNORMAL HIGH

## 2016-02-12 LAB — COMPLEMENT, TOTAL: Compl, Total (CH50): >60 U/mL — ABNORMAL HIGH (ref 31–60)

## 2016-02-13 ENCOUNTER — Ambulatory Visit (HOSPITAL_BASED_OUTPATIENT_CLINIC_OR_DEPARTMENT_OTHER): Payer: BLUE CROSS/BLUE SHIELD | Attending: Cardiology | Admitting: Cardiology

## 2016-02-13 VITALS — Ht 62.0 in | Wt 271.0 lb

## 2016-02-13 DIAGNOSIS — Z79899 Other long term (current) drug therapy: Secondary | ICD-10-CM | POA: Diagnosis not present

## 2016-02-13 DIAGNOSIS — G4733 Obstructive sleep apnea (adult) (pediatric): Secondary | ICD-10-CM

## 2016-02-13 DIAGNOSIS — R0683 Snoring: Secondary | ICD-10-CM | POA: Insufficient documentation

## 2016-02-13 DIAGNOSIS — G4719 Other hypersomnia: Secondary | ICD-10-CM | POA: Diagnosis present

## 2016-02-13 DIAGNOSIS — Z7984 Long term (current) use of oral hypoglycemic drugs: Secondary | ICD-10-CM | POA: Diagnosis not present

## 2016-02-13 LAB — DIPHTHERIA / TETANUS ANTIBODY PANEL
Diphtheria Ab: 0.8 IU/mL
Tetanus Toxin Antibody, Total: 4.57 IU/mL (ref >0.15)

## 2016-02-14 ENCOUNTER — Telehealth: Payer: Self-pay | Admitting: Cardiology

## 2016-02-14 LAB — IGG SUBCLASSES
IgG Subclass 1: 316 mg/dL — ABNORMAL LOW (ref 382–929)
IgG Subclass 2: 163 mg/dL — ABNORMAL LOW (ref 241–700)
IgG Subclass 3: 30 mg/dL (ref 22–178)
IgG Subclass 4: 7.5 mg/dL (ref 4.0–86.0)

## 2016-02-14 NOTE — Telephone Encounter (Signed)
FYI, patient has mild OSA with minimal O2 desats - I will see patient back in followup to discuss treatment options

## 2016-02-14 NOTE — Procedures (Signed)
   Patient Name: [name], [name] MRN: [id number] Study Date: 02/13/2016 Gender: Female D.O.B: 1960-09-21 Age (years): 89 Referring Provider: Armanda Magic MD, ABSM Interpreting Physician: Armanda Magic MD, ABSM RPSGT: Melburn Popper  Weight (lbs): 271 BMI: 50 Height (inches): 62 Neck Size: 16.00  CLINICAL INFORMATION Sleep Study Type: NPSG Indication for sleep study: Excessive Daytime Sleepiness Epworth Sleepiness Score: 8  SLEEP STUDY TECHNIQUE As per the AASM Manual for the Scoring of Sleep and Associated Events v2.3 (April 2016) with a hypopnea requiring 4% desaturations. The channels recorded and monitored were frontal, central and occipital EEG, electrooculogram (EOG), submentalis EMG (chin), nasal and oral airflow, thoracic and abdominal wall motion, anterior tibialis EMG, snore microphone, electrocardiogram, and pulse oximetry.  MEDICATIONS  Patient's medications include: ENALAPRIL, METFORMIN HYDROCHLORIDE ER, Brilinta, METOPROLOL, PLAQUENIL, Oxycodone, FENTANYL, NEURONTIN, LEVOTHYROXINE. Medications self-administered by patient during sleep study : No sleep medicine administered.  SLEEP ARCHITECTURE The study was initiated at 10:04:24 PM and ended at 4:41:14 AM. Sleep onset time was 18.6 minutes and the sleep efficiency was reduced at 74.3%. The total sleep time was 294.7 minutes. Stage REM latency was prolonged at147.0 minutes. The patient spent 5.77% of the night in stage N1 sleep, 53.51% in stage N2 sleep, 21.21% in stage N3 and 19.51% in REM. Alpha intrusion was absent. Supine sleep was 93.32%.  RESPIRATORY PARAMETERS The overall apnea/hypopnea index (AHI) was 5.9 per hour. There were 1 total apneas, including 1 obstructive, 0 central and 0 mixed apneas. There were 28 hypopneas and 3 RERAs. The AHI during Stage REM sleep was 21.9 per hour. AHI while supine was 6.3 per hour. The mean oxygen saturation was 92.34%. The minimum SpO2 during sleep was 86.00%. Soft  snoring was noted during this study.  CARDIAC DATA The 2 lead EKG demonstrated sinus rhythm.  Other EKG findings include: None.  LEG MOVEMENT DATA The total PLMS were 31 with a resulting PLMS index of 6.31. Associated arousal with leg movement index was 0.2 .  IMPRESSIONS - Mild obstructive sleep apnea occurred during this study (AHI = 5.9/h). - No significant central sleep apnea occurred during this study (CAI = 0.0/h). - Mild oxygen desaturation was noted during this study (Min O2 = 86.00%). - The patient snored with Soft snoring volume. - No cardiac abnormalities were noted during this study. - Mild periodic limb movements of sleep occurred during the study. No significant associated arousals.  DIAGNOSIS - Obstructive Sleep Apnea (327.23 [G47.33 ICD-10])  RECOMMENDATIONS - Positional therapy avoiding supine position during sleep. - Very mild obstructive sleep apnea. Return to discuss treatment options. - Avoid alcohol, sedatives and other CNS depressants that may worsen sleep apnea and disrupt normal sleep architecture. - Sleep hygiene should be reviewed to assess factors that may improve sleep quality. - Weight management and regular exercise should be initiated or continued if appropriate.   Armanda Magic Diplomate, American Board of Sleep Medicine  ELECTRONICALLY SIGNED ON:  02/14/2016, 9:47 PM [facility] PH: [phone]   FX: [phone] ACCREDITED BY THE AMERICAN ACADEMY OF SLEEP MEDICINE

## 2016-02-15 ENCOUNTER — Ambulatory Visit (INDEPENDENT_AMBULATORY_CARE_PROVIDER_SITE_OTHER): Payer: BLUE CROSS/BLUE SHIELD | Admitting: Pediatrics

## 2016-02-15 ENCOUNTER — Encounter: Payer: Self-pay | Admitting: Pediatrics

## 2016-02-15 VITALS — BP 130/86 | HR 78 | Temp 97.9°F | Resp 20 | Ht 62.21 in | Wt 270.3 lb

## 2016-02-15 DIAGNOSIS — Z889 Allergy status to unspecified drugs, medicaments and biological substances status: Secondary | ICD-10-CM

## 2016-02-15 DIAGNOSIS — J4530 Mild persistent asthma, uncomplicated: Secondary | ICD-10-CM | POA: Insufficient documentation

## 2016-02-15 DIAGNOSIS — J4550 Severe persistent asthma, uncomplicated: Secondary | ICD-10-CM | POA: Diagnosis not present

## 2016-02-15 DIAGNOSIS — K219 Gastro-esophageal reflux disease without esophagitis: Secondary | ICD-10-CM

## 2016-02-15 DIAGNOSIS — I25810 Atherosclerosis of coronary artery bypass graft(s) without angina pectoris: Secondary | ICD-10-CM | POA: Diagnosis not present

## 2016-02-15 DIAGNOSIS — T7800XD Anaphylactic reaction due to unspecified food, subsequent encounter: Secondary | ICD-10-CM | POA: Diagnosis not present

## 2016-02-15 DIAGNOSIS — J301 Allergic rhinitis due to pollen: Secondary | ICD-10-CM

## 2016-02-15 DIAGNOSIS — M351 Other overlap syndromes: Secondary | ICD-10-CM

## 2016-02-15 DIAGNOSIS — T7800XA Anaphylactic reaction due to unspecified food, initial encounter: Secondary | ICD-10-CM | POA: Insufficient documentation

## 2016-02-15 DIAGNOSIS — Z79899 Other long term (current) drug therapy: Secondary | ICD-10-CM

## 2016-02-15 LAB — PULMONARY FUNCTION TEST

## 2016-02-15 LAB — STREP PNEUMONIAE 14 SEROTYPES IGG
Strep pneumo Type 12: 1.5 ug/mL
Strep pneumo Type 19: 1.9 ug/mL
Strep pneumo Type 4: >24 ug/mL
Strep pneumo Type 9: 1.1 ug/mL
Strep pneumoniae Type 1 Abs: 0.7 ug/mL
Strep pneumoniae Type 14 Abs: 10.1 ug/mL
Strep pneumoniae Type 18C Abs: 1.6 ug/mL
Strep pneumoniae Type 23F Abs: <0.3 ug/mL
Strep pneumoniae Type 3 Abs: 12.4 ug/mL
Strep pneumoniae Type 5 Abs: 4.6 ug/mL
Strep pneumoniae Type 6B Abs: 0.3 ug/mL
Strep pneumoniae Type 7F Abs: 0.5 ug/mL
Strep pneumoniae Type 8 Abs: 0.5 ug/mL
Strep pneumoniae Type 9N Abs: 1 ug/mL

## 2016-02-15 LAB — ISOHEMAGGLUTININ TITER
Isohemagglutinin Titer A1: <1:2 {titer}
Isohemagglutinin Titer A2: <1:2 {titer}
Isohemagglutinin Titer B: 1:16 {titer} — AB

## 2016-02-15 NOTE — Progress Notes (Signed)
[address] [place] [address] Dept: [phone]  FOLLOW UP NOTE  Patient ID: [name], female    DOB: 05-27-1961  Age: 55 y.o. MRN: [id number] Date of Office Visit: 02/15/2016  Assessment Chief Complaint: Allergy Testing  HPI [name] presents for allergy skin testing because of her asthma, allergic rhinitis and food allergies. She also comes in to be tested to determine if she is allergic to penicillin. Her asthma is well controlled with the addition of Qvar. I do not have the results of the serum Immunocap IgE to a variety of ish and shellfish   Drug Allergies:  Allergies  Allergen Reactions  . Fish Allergy Anaphylaxis  . Fish Oil [Omega-3 Fatty Acids] Swelling    Throat swelling   . Gabapentin Other (See Comments)    Anxiety on high doses  . Ipratropium Shortness Of Breath  . Ipratropium-Albuterol Other (See Comments) and Shortness Of Breath    Wheezing becomes worse, can take albuterol with ipratropium  . Peanut-Containing Drug Products Shortness Of Breath  . Red Yeast Rice [Cholestin] Other (See Comments)    Muscle pain and severe joint pain.   . Atrovent Other (See Comments)    Wheezing becomes worse  . Crestor [Rosuvastatin] Other (See Comments)    Extreme joint pain Extreme joint pain, to this & other statins  . Suprep [Na Sulfate-K Sulfate-Mg Sulf] Nausea And Vomiting  . Tape     Electrodes causes skin breakdown  . Penicillins Other (See Comments) and Rash    Unknown childhood reaction     Physical Exam: BP 130/86 mmHg  Pulse 78  Temp(Src) 97.9 F (36.6 C) (Oral)  Resp 20  Ht 5' 2.21" (1.58 m)  Wt 270 lb 4.8 oz (122.607 kg)  BMI 49.11 kg/m2   Physical Exam  Constitutional: She appears well-developed and well-nourished.  HENT:  Eyes normal. Ears normal. Nose normal. Pharynx normal.  Neck: Neck supple.  Cardiovascular:  S1 and S2 normal no murmurs  Pulmonary/Chest:  Clear to percussion auscultation  Lymphadenopathy:    She has  no cervical adenopathy.  Neurological: She is alert.  Skin:  Clear  Psychiatric: She has a normal mood and affect. Her behavior is normal. Thought content normal.  Vitals reviewed.   Diagnostics:  FVC 2.53 L FEV1 2.01 L. Predicted FVC 3.19 L predicted FEV1 2.51 L-the spirometry is in the normal range  Allergy skin tests were extremely positive to grass pollens, tree pollens, weeds, dust mite, cat, dog. Food skin testing was negative  Skin testing to penicillin G and  Pre-Pen was negative  Assessment and Plan: 1. Severe persistent asthma, uncomplicated   2. Allergic rhinitis due to pollen   3. Allergy with anaphylaxis due to food, subsequent encounter   4. Mixed connective tissue disease (HCC)   5. Gastroesophageal reflux disease without esophagitis   6. Coronary artery disease involving autologous artery coronary bypass graft without angina pectoris   7. Current use of beta blocker   8. Multiple drug allergies     No orders of the defined types were placed in this encounter.    Patient Instructions  Environmental control of dust mite and mold Zyrtec 10 mg once a day Proventil 2 puffs every 4 hours if needed for wheezing or coughing spells Fluticasone 2 sprays per nostril once a day if needed for stuffy nose Qvar 40 one puff twice a day to prevent coughing or wheezing. She will then rinse gargle and spit out If you have an  allergic reaction take Benadryl 4 teaspoonfuls every 4 hours and if you have life-threatening symptoms inject  with EpiPen 0.3 mg Amoxicillin 250 mg per 5 ML to take a one teaspoonful 3 times a day for 2 days to make sure that she can tolerate penicillin. If she develops a rash or allergic symptoms she will call me    Return in about 6 weeks (around 03/28/2016).    Thank you for the opportunity to care for this patient.  Please do not hesitate to contact me with questions.  Tonette Bihari, M.D.  [facility] [address] [place], [place] [address] [phone]

## 2016-02-15 NOTE — Patient Instructions (Addendum)
Environmental control of dust mite and mold Zyrtec 10 mg once a day Proventil 2 puffs every 4 hours if needed for wheezing or coughing spells Fluticasone 2 sprays per nostril once a day if needed for stuffy nose Qvar 40 one puff twice a day to prevent coughing or wheezing. She will then rinse gargle and spit out If you have an allergic reaction take Benadryl 4 teaspoonfuls every 4 hours and if you have life-threatening symptoms inject  with EpiPen 0.3 mg Amoxicillin 250 mg per 5 ML to take a one teaspoonful 3 times a day for 2 days to make sure that she can tolerate penicillin. If she develops a rash or allergic symptoms she will call me

## 2016-02-17 LAB — MANNOSE BINDING LECTIN (MBL): Mannose-Binding Lectin(MBL): 2 ng/mL — ABNORMAL LOW

## 2016-02-18 NOTE — Telephone Encounter (Signed)
Patient has been informed of information.  Stated verbal understanding. Patient is due to follow-up in September and will discuss treatment then.

## 2016-02-23 ENCOUNTER — Emergency Department (HOSPITAL_BASED_OUTPATIENT_CLINIC_OR_DEPARTMENT_OTHER): Payer: BLUE CROSS/BLUE SHIELD

## 2016-02-23 ENCOUNTER — Emergency Department (HOSPITAL_BASED_OUTPATIENT_CLINIC_OR_DEPARTMENT_OTHER)
Admission: EM | Admit: 2016-02-23 | Discharge: 2016-02-23 | Disposition: A | Discharge status: Home / Self Care | Payer: BLUE CROSS/BLUE SHIELD | Attending: Emergency Medicine | Admitting: Emergency Medicine

## 2016-02-23 ENCOUNTER — Encounter (HOSPITAL_BASED_OUTPATIENT_CLINIC_OR_DEPARTMENT_OTHER): Payer: Self-pay | Admitting: *Deleted

## 2016-02-23 DIAGNOSIS — R112 Nausea with vomiting, unspecified: Secondary | ICD-10-CM | POA: Diagnosis present

## 2016-02-23 DIAGNOSIS — Z7951 Long term (current) use of inhaled steroids: Secondary | ICD-10-CM | POA: Diagnosis not present

## 2016-02-23 DIAGNOSIS — I2510 Atherosclerotic heart disease of native coronary artery without angina pectoris: Secondary | ICD-10-CM | POA: Insufficient documentation

## 2016-02-23 DIAGNOSIS — I119 Hypertensive heart disease without heart failure: Secondary | ICD-10-CM | POA: Insufficient documentation

## 2016-02-23 DIAGNOSIS — Z7982 Long term (current) use of aspirin: Secondary | ICD-10-CM | POA: Diagnosis not present

## 2016-02-23 DIAGNOSIS — J45909 Unspecified asthma, uncomplicated: Secondary | ICD-10-CM | POA: Insufficient documentation

## 2016-02-23 DIAGNOSIS — E119 Type 2 diabetes mellitus without complications: Secondary | ICD-10-CM | POA: Diagnosis not present

## 2016-02-23 DIAGNOSIS — Z7984 Long term (current) use of oral hypoglycemic drugs: Secondary | ICD-10-CM | POA: Insufficient documentation

## 2016-02-23 DIAGNOSIS — R51 Headache: Secondary | ICD-10-CM | POA: Insufficient documentation

## 2016-02-23 DIAGNOSIS — Z794 Long term (current) use of insulin: Secondary | ICD-10-CM | POA: Diagnosis not present

## 2016-02-23 DIAGNOSIS — Z87891 Personal history of nicotine dependence: Secondary | ICD-10-CM | POA: Insufficient documentation

## 2016-02-23 DIAGNOSIS — Z79899 Other long term (current) drug therapy: Secondary | ICD-10-CM | POA: Insufficient documentation

## 2016-02-23 DIAGNOSIS — E785 Hyperlipidemia, unspecified: Secondary | ICD-10-CM | POA: Insufficient documentation

## 2016-02-23 DIAGNOSIS — I509 Heart failure, unspecified: Secondary | ICD-10-CM | POA: Diagnosis not present

## 2016-02-23 DIAGNOSIS — R110 Nausea: Secondary | ICD-10-CM

## 2016-02-23 DIAGNOSIS — L03115 Cellulitis of right lower limb: Secondary | ICD-10-CM | POA: Diagnosis not present

## 2016-02-23 HISTORY — DX: Atherosclerotic heart disease of native coronary artery without angina pectoris: I25.10

## 2016-02-23 LAB — CBC WITH DIFFERENTIAL/PLATELET
Basophils Absolute: 0 10*3/uL (ref 0.0–0.1)
Basophils Relative: 0 %
Eosinophils Absolute: 0 10*3/uL (ref 0.0–0.7)
Eosinophils Relative: 0 %
HCT: 40.9 % (ref 36.0–46.0)
Hemoglobin: 13 g/dL (ref 12.0–15.0)
Lymphocytes Relative: 11 %
Lymphs Abs: 1.1 10*3/uL (ref 0.7–4.0)
MCH: 28.3 pg (ref 26.0–34.0)
MCHC: 31.8 g/dL (ref 30.0–36.0)
MCV: 88.9 fL (ref 78.0–100.0)
Monocytes Absolute: 0.9 10*3/uL (ref 0.1–1.0)
Monocytes Relative: 9 %
Neutro Abs: 8.1 10*3/uL — ABNORMAL HIGH (ref 1.7–7.7)
Neutrophils Relative %: 80 %
Platelets: 219 10*3/uL (ref 150–400)
RBC: 4.6 MIL/uL (ref 3.87–5.11)
RDW: 14.5 % (ref 11.5–15.5)
WBC: 10.1 10*3/uL (ref 4.0–10.5)

## 2016-02-23 LAB — URINALYSIS, ROUTINE W REFLEX MICROSCOPIC
Bilirubin Urine: NEGATIVE
Glucose, UA: NEGATIVE mg/dL
Hgb urine dipstick: NEGATIVE
Ketones, ur: NEGATIVE mg/dL
Leukocytes, UA: NEGATIVE
Nitrite: NEGATIVE
Protein, ur: NEGATIVE mg/dL
Specific Gravity, Urine: 1.009 (ref 1.005–1.030)
pH: 6 (ref 5.0–8.0)

## 2016-02-23 LAB — COMPREHENSIVE METABOLIC PANEL
ALT: 32 U/L (ref 14–54)
AST: 31 U/L (ref 15–41)
Albumin: 3.7 g/dL (ref 3.5–5.0)
Alkaline Phosphatase: 54 U/L (ref 38–126)
Anion gap: 7 (ref 5–15)
BUN: 11 mg/dL (ref 6–20)
CO2: 28 mmol/L (ref 22–32)
Calcium: 9.1 mg/dL (ref 8.9–10.3)
Chloride: 96 mmol/L — ABNORMAL LOW (ref 101–111)
Creatinine, Ser: 0.89 mg/dL (ref 0.44–1.00)
GFR calc Af Amer: >60 mL/min (ref >60)
GFR calc non Af Amer: >60 mL/min (ref >60)
Glucose, Bld: 238 mg/dL — ABNORMAL HIGH (ref 65–99)
Potassium: 4 mmol/L (ref 3.5–5.1)
Sodium: 131 mmol/L — ABNORMAL LOW (ref 135–145)
Total Bilirubin: 1 mg/dL (ref 0.3–1.2)
Total Protein: 6.6 g/dL (ref 6.5–8.1)

## 2016-02-23 LAB — I-STAT CG4 LACTIC ACID, ED
Lactic Acid, Venous: 2.12 mmol/L (ref 0.5–2.0)
Lactic Acid, Venous: 0.99 mmol/L (ref 0.5–2.0)

## 2016-02-23 LAB — LIPASE, BLOOD: Lipase: 22 U/L (ref 11–51)

## 2016-02-23 MED ORDER — VANCOMYCIN HCL 500 MG IV SOLR
INTRAVENOUS | Status: AC
  Administered 2016-02-23: 14:00:00
  Filled 2016-02-23: qty 2000

## 2016-02-23 MED ORDER — ONDANSETRON HCL 4 MG PO TABS: 4.0000 mg | ORAL_TABLET | Freq: Three times a day (TID) | ORAL | Status: DC | PRN

## 2016-02-23 MED ORDER — SODIUM CHLORIDE 0.9 % IV BOLUS (SEPSIS)
500.0000 mL | Freq: Once | INTRAVENOUS | Status: AC
  Administered 2016-02-23: 500 mL via INTRAVENOUS

## 2016-02-23 MED ORDER — ONDANSETRON HCL 4 MG/2ML IJ SOLN
4.0000 mg | Freq: Once | INTRAMUSCULAR | Status: AC
  Administered 2016-02-23: 4 mg via INTRAVENOUS
  Filled 2016-02-23: qty 2

## 2016-02-23 MED ORDER — SODIUM CHLORIDE 0.9 % IV BOLUS (SEPSIS)
1000.0000 mL | Freq: Once | INTRAVENOUS | Status: AC
  Administered 2016-02-23: 1000 mL via INTRAVENOUS

## 2016-02-23 MED ORDER — VANCOMYCIN HCL 10 G IV SOLR
2000.0000 mg | Freq: Once | INTRAVENOUS | Status: DC
  Filled 2016-02-23: qty 2000

## 2016-02-23 MED ORDER — SULFAMETHOXAZOLE-TRIMETHOPRIM 800-160 MG PO TABS: 1.0000 | ORAL_TABLET | Freq: Two times a day (BID) | ORAL | Status: AC

## 2016-02-23 MED ORDER — DIPHENHYDRAMINE HCL 50 MG/ML IJ SOLN
25.0000 mg | Freq: Once | INTRAMUSCULAR | Status: AC
  Administered 2016-02-23: 25 mg via INTRAVENOUS
  Filled 2016-02-23: qty 1

## 2016-02-23 MED ORDER — METOCLOPRAMIDE HCL 5 MG/ML IJ SOLN
10.0000 mg | Freq: Once | INTRAMUSCULAR | Status: AC
  Administered 2016-02-23: 10 mg via INTRAVENOUS
  Filled 2016-02-23: qty 2

## 2016-02-23 MED FILL — ONDANSETRON HCL 4 MG TABLET: 4 | 7 days supply | Qty: 20 | Fill #0

## 2016-02-23 MED FILL — SULFAMETHOXAZOLE-TMP DS TAB: 800-160 | 7 days supply | Qty: 14 | Fill #0

## 2016-02-23 NOTE — ED Notes (Signed)
Pt presents with multiple complaints. In addition to triage note, pt noted to have redness and tenderness to RLE. Marked by physician. Also c/o left ear pain. Found tick on herself Monday, but it was not attached.

## 2016-02-23 NOTE — ED Notes (Signed)
Pt c/o n/v yesterday. Was wearing Fentanyl patch and was out in heat during the day. Last night states she "felt drunk". Removed patch pta to ED. Vomited 2-3 times this morning

## 2016-02-23 NOTE — Discharge Instructions (Signed)
You were seen and evaluated today for your vomiting. This may be related to an adverse reaction to your fentanyl patch and oxycodone. Please follow-up with your primary care physician for any medication adjustments. It was also noted today on your examination that you appear to have an infection of your skin on your leg. Please take the antibiotics prescribed and also follow up with your primary care physician for reevaluation.  Cellulitis Cellulitis is an infection of the skin and the tissue beneath it. The infected area is usually red and tender. Cellulitis occurs most often in the arms and lower legs.  CAUSES  Cellulitis is caused by bacteria that enter the skin through cracks or cuts in the skin. The most common types of bacteria that cause cellulitis are staphylococci and streptococci. SIGNS AND SYMPTOMS   Redness and warmth.  Swelling.  Tenderness or pain.  Fever. DIAGNOSIS  Your health care provider can usually determine what is wrong based on a physical exam. Blood tests may also be done. TREATMENT  Treatment usually involves taking an antibiotic medicine. HOME CARE INSTRUCTIONS   Take your antibiotic medicine as directed by your health care provider. Finish the antibiotic even if you start to feel better.  Keep the infected arm or leg elevated to reduce swelling.  Apply a warm cloth to the affected area up to 4 times per day to relieve pain.  Take medicines only as directed by your health care provider.  Keep all follow-up visits as directed by your health care provider. SEEK MEDICAL CARE IF:   You notice red streaks coming from the infected area.  Your red area gets larger or turns dark in color.  Your bone or joint underneath the infected area becomes painful after the skin has healed.  Your infection returns in the same area or another area.  You notice a swollen bump in the infected area.  You develop new symptoms.  You have a fever. SEEK IMMEDIATE MEDICAL  CARE IF:   You feel very sleepy.  You develop vomiting or diarrhea.  You have a general ill feeling (malaise) with muscle aches and pains.   This information is not intended to replace advice given to you by your health care provider. Make sure you discuss any questions you have with your health care provider.   Document Released: 06/07/2005 Document Revised: 05/19/2015 Document Reviewed: 11/13/2011 Elsevier Interactive Patient Education [employer]

## 2016-02-23 NOTE — ED Notes (Signed)
Infusion completed at this time. EDP notified.

## 2016-02-23 NOTE — ED Notes (Signed)
MD at bedside. 

## 2016-02-23 NOTE — ED Provider Notes (Signed)
CSN: [id number]     Arrival date & time 02/23/16  3299 History   First MD Initiated Contact with Patient 02/23/16 [phone]     Chief Complaint  Patient presents with  . Emesis     (Consider location/radiation/quality/duration/timing/severity/associated sxs/prior Treatment) HPI Comments: 55 year old female with complicated past medical history presents for nausea and vomiting. The patient reports that she has been nauseous and not felt well since yesterday afternoon. She states that she had put on a new fentanyl patch and that she went to the store and it was very hot and she felt like she was getting more fentanyl and she should've been. She was in pain when she got home from being at the store though and proceeded to take an oxycodone. Before bed she didn't seem quite herself but her husband didn't think anything of it because when she gets tired she often appears disoriented and that's how she appeared before bed. When he woke her up this morning she still did not seem quite herself and felt nauseous and was vomiting. She says that she was able to remove her fentanyl patch before arrival and has started to feel better. She also reports a headache. Denies any abdominal pain. No diarrhea or constipation. No fevers or chills.   Past Medical History  Diagnosis Date  . Asthma   . Hypertension   . Diabetes mellitus   . IBS (irritable bowel syndrome)   . GERD (gastroesophageal reflux disease)   . Eczema   . Thyroid disease   . Mixed connective tissue disease (HCC)   . C. difficile diarrhea   . Hyperlipidemia   . CHF (congestive heart failure) (HCC) 09/2015    cath with normal LM, 30% LAD, 85% mid RCA and 95% distal RCA s/p PCI of the mid to distal RCA and now on DAPT with ASA and Ticagrelor.    . Excessive daytime sleepiness 11/26/2015  . Coronary artery disease    Past Surgical History  Procedure Laterality Date  . Breast surgery    . Cholecystectomy    . Abdominal hysterectomy    .  Appendectomy    . Leg surgery Left     muscle biospy  . Wrist surgery Left   . Cardiac catheterization    . Coronary angioplasty      normal LM, 30% LAD, 85% mid RCA and 95% distal RCA s/p PCI of the mid to distal RCA and now on DAPT with ASA and Ticagrelor.     Family History  Problem Relation Age of Onset  . CAD Mother   . Hypertension Mother   . Heart attack Mother   . CAD Father   . Heart attack Father   . Allergic rhinitis Father   . Asthma Father   . Hypertension Brother   . Hypertension Brother   . Pancreatic cancer Paternal Aunt   . Breast cancer Paternal Aunt    Social History  Substance Use Topics  . Smoking status: Former Smoker -- 1.00 packs/day for 20 years    Types: Cigarettes    Quit date: 02/11/2004  . Smokeless tobacco: Never Used  . Alcohol Use: Yes     Comment: once a month   OB History    No data available     Review of Systems  Constitutional: Negative for fever, chills and fatigue.  HENT: Negative for congestion, rhinorrhea and sinus pressure.   Eyes: Negative for visual disturbance.  Respiratory: Negative for cough, chest tightness, shortness of breath and  wheezing.   Cardiovascular: Negative for chest pain, palpitations and leg swelling.  Gastrointestinal: Positive for nausea and vomiting. Negative for abdominal pain and diarrhea.  Genitourinary: Negative for dysuria, urgency and hematuria.  Musculoskeletal: Negative for myalgias and back pain.  Skin: Negative for rash.  Neurological: Positive for headaches. Negative for dizziness, syncope and weakness.  Hematological: Does not bruise/bleed easily.      Allergies  Fish allergy; Fish oil; Gabapentin; Ipratropium; Ipratropium-albuterol; Peanut-containing drug products; Red yeast rice; Atrovent; Crestor; Suprep; Tape; and Penicillins  Home Medications   Prior to Admission medications   Medication Sig Start Date End Date Taking? Authorizing Provider  albuterol (PROVENTIL HFA;VENTOLIN HFA)  108 (90 BASE) MCG/ACT inhaler Inhale 2 puffs into the lungs every 4 (four) hours as needed for wheezing or shortness of breath. 11/14/12  Yes Richarda Overlie, MD  albuterol (PROVENTIL) (2.5 MG/3ML) 0.083% nebulizer solution Inhale 2.5 mLs into the lungs as directed. 09/24/15  Yes Historical Provider, MD  Alpha Lipoic Acid 200 MG CAPS Take 600 mg by mouth 3 (three) times daily.   Yes Historical Provider, MD  aspirin EC 81 MG tablet Take 81 mg by mouth daily.   Yes Historical Provider, MD  b complex vitamins tablet Take 1 tablet by mouth daily.   Yes Historical Provider, MD  beclomethasone (QVAR) 40 MCG/ACT inhaler ONE PUFF TWICE A DAY TO PREVENT COUGH OR WHEEZE. USE SPACER. 02/10/16  Yes Fletcher Anon, MD  betamethasone dipropionate (DIPROLENE) 0.05 % cream Apply 1 application topically daily as needed (eczema).   Yes Historical Provider, MD  BRILINTA 90 MG TABS tablet Take 90 mg by mouth 2 (two) times daily. 11/25/15  Yes Historical Provider, MD  cetirizine (ZYRTEC) 10 MG tablet Take 10 mg by mouth daily as needed for allergies.   Yes Historical Provider, MD  clotrimazole-betamethasone (LOTRISONE) cream Apply 1 application topically daily as needed (eczema).   Yes Historical Provider, MD  diphenhydrAMINE (BENADRYL) 25 MG tablet Take 1 tablet (25 mg total) by mouth every 6 (six) hours as needed for itching or allergies. 04/05/13  Yes Fayrene Helper, PA-C  enalapril (VASOTEC) 10 MG tablet Take 10 mg by mouth 2 (two) times daily.   Yes Historical Provider, MD  EPINEPHrine (EPIPEN 2-PAK) 0.3 mg/0.3 mL IJ SOAJ injection USE AS DIRECTED FOR SEVERE ALLERGIC REACTION. 02/10/16  Yes Fletcher Anon, MD  EPINEPHrine 0.3 mg/0.3 mL IJ SOAJ injection Inject 0.3 mg into the muscle once as needed.   Yes Historical Provider, MD  Estrogens, Conjugated (PREMARIN VA) Place 1 application vaginally 3 (three) times a week. On Saturdays, Mondays, and Wednesday (cream)   Yes Historical Provider, MD  fentaNYL (DURAGESIC - DOSED MCG/HR) 100  MCG/HR Place 1 patch onto the skin every 3 (three) days.     Yes Historical Provider, MD  fluticasone (FLONASE) 50 MCG/ACT nasal spray Place 2 sprays into the nose 2 (two) times daily.     Yes Historical Provider, MD  furosemide (LASIX) 20 MG tablet Take 20 mg by mouth 3 (three) times daily as needed for fluid.   Yes Historical Provider, MD  furosemide (LASIX) 40 MG tablet Take 40 mg by mouth.   Yes Historical Provider, MD  gabapentin (NEURONTIN) 100 MG capsule Take 400-700 mg by mouth See admin instructions. Pt takes 400mg  in the morning, 400mg  at lunchtime and 700mg  in the evening   Yes Historical Provider, MD  GLUCOMANNAN PO Take 700 mg by mouth 2 (two) times daily.   Yes Historical Provider, MD  glucose blood (BAYER CONTOUR TEST) test strip  01/26/16  Yes Historical Provider, MD  Guaifenesin (MUCINEX MAXIMUM STRENGTH) 1200 MG TB12 Take 1,200 mg by mouth 2 (two) times daily.   Yes Historical Provider, MD  hydroxychloroquine (PLAQUENIL) 200 MG tablet Take 200 mg by mouth 2 (two) times daily.   Yes Historical Provider, MD  ibuprofen (ADVIL,MOTRIN) 400 MG tablet Take 1 tablet (400 mg total) by mouth every 6 (six) hours as needed. 02/04/16  Yes Lyndal Pulley, MD  insulin regular human CONCENTRATED (HUMULIN R) 500 UNIT/ML SOLN injection Inject 10-40 Units into the skin 3 (three) times daily with meals. Based on sliding scale   Yes Historical Provider, MD  Insulin Syringe-Needle U-100 (EASY TOUCH INSULIN SYRINGE) 30G X 5/16" 0.5 ML MISC  01/26/16  Yes Historical Provider, MD  levalbuterol (XOPENEX) 1.25 MG/0.5ML nebulizer solution Take 1 ampule by nebulization 4 (four) times daily as needed for wheezing or shortness of breath.   Yes Historical Provider, MD  levothyroxine (SYNTHROID, LEVOTHROID) 200 MCG tablet Take 200 mcg by mouth daily. Take with 2 25 mcg tablets for a 250 mcg dose   Yes Historical Provider, MD  levothyroxine (SYNTHROID, LEVOTHROID) 25 MCG tablet Take 25 mcg by mouth daily. 10/20/15  Yes  Historical Provider, MD  magnesium oxide (MAG-OX) 400 MG tablet Take 400 mg by mouth 3 (three) times daily.   Yes Historical Provider, MD  metFORMIN (GLUCOPHAGE-XR) 500 MG 24 hr tablet Take 1,000 mg by mouth 2 (two) times daily. 11/25/15  Yes Historical Provider, MD  metoCLOPramide (REGLAN) 5 MG tablet Take 1 tablet (5 mg total) by mouth 4 (four) times daily -  before meals and at bedtime. 02/13/15  Yes Osvaldo Shipper, MD  metoprolol tartrate (LOPRESSOR) 25 MG tablet Take 25 mg by mouth 2 (two) times daily. 11/25/15  Yes Historical Provider, MD  milk thistle 175 MG tablet Take 1,000 mg by mouth 2 (two) times daily.   Yes Historical Provider, MD  Multiple Vitamin (MULTIVITAMIN) tablet Take 1 tablet by mouth daily.   Yes Historical Provider, MD  mupirocin cream (BACTROBAN) 2 % Apply 1 application topically daily as needed (eczema).   Yes Historical Provider, MD  omeprazole (PRILOSEC) 20 MG capsule Take 20 mg by mouth.   Yes Historical Provider, MD  Oxycodone HCl 10 MG TABS Take 10 mg by mouth every 4 (four) hours as needed (for pain).   Yes Historical Provider, MD  oxymetazoline (AFRIN) 0.05 % nasal spray Place 1 spray into both nostrils 2 (two) times daily as needed for congestion.   Yes Historical Provider, MD  Polyethyl Glycol-Propyl Glycol (SYSTANE ULTRA OP) Apply 1 drop to eye daily as needed (dry eyes).   Yes Historical Provider, MD  promethazine (PHENERGAN) 25 MG tablet Take 25 mg by mouth every 6 (six) hours as needed for nausea or vomiting.   Yes Historical Provider, MD  quiNINE (QUALAQUIN) 324 MG capsule Take 648 mg by mouth daily as needed (inflammation).   Yes Historical Provider, MD  saccharomyces boulardii (FLORASTOR) 250 MG capsule Take 1 capsule (250 mg total) by mouth 2 (two) times daily. 02/13/15  Yes Osvaldo Shipper, MD  vitamin A 13244 UNIT capsule Take 10,000 Units by mouth daily.   Yes Historical Provider, MD  vitamin B-12 (CYANOCOBALAMIN) 1000 MCG tablet Take 1,000 mcg by mouth daily.      Yes Historical Provider, MD  Vitamin D, Ergocalciferol, (DRISDOL) 50000 units CAPS capsule Take 1 capsule by mouth once a week. 11/25/15  Yes Historical  Provider, MD  Vitamins-Lipotropics (LIPO-FLAVONOID PLUS PO) Take 1 tablet by mouth 3 (three) times daily.   Yes Historical Provider, MD  ondansetron (ZOFRAN) 4 MG tablet Take 1 tablet (4 mg total) by mouth every 8 (eight) hours as needed for nausea or vomiting. 02/23/16   Leta Baptist, MD  sulfamethoxazole-trimethoprim (BACTRIM DS,SEPTRA DS) 800-160 MG tablet Take 1 tablet by mouth 2 (two) times daily. 02/23/16 03/01/16  Leta Baptist, MD   BP 107/61 mmHg  Pulse 74  Temp(Src) 98.1 F (36.7 C) (Oral)  Resp 20  Ht  (1.575 m)  Wt 270 lb (122.471 kg)  BMI 49.37 kg/m2  SpO2 93% Physical Exam  Constitutional: She is oriented to person, place, and time. She appears well-developed and well-nourished.  Non-toxic appearance. She does not have a sickly appearance. She appears ill. No distress.  HENT:  Head: Normocephalic and atraumatic.  Right Ear: External ear normal.  Left Ear: External ear normal.  Nose: Nose normal.  Mouth/Throat: Oropharynx is clear and moist. No oropharyngeal exudate.  Eyes: EOM are normal. Pupils are equal, round, and reactive to light.  Neck: Normal range of motion. Neck supple.  Cardiovascular: Normal rate, regular rhythm, normal heart sounds and intact distal pulses.   No murmur heard. Pulmonary/Chest: Effort normal. No respiratory distress. She has no wheezes. She has no rales.  Abdominal: Soft. She exhibits no distension. There is no tenderness.  Musculoskeletal: Normal range of motion. She exhibits no edema or tenderness.  Neurological: She is alert and oriented to person, place, and time. She has normal strength. No cranial nerve deficit or sensory deficit.  Skin: Skin is warm and dry. No rash noted. She is not diaphoretic.     Vitals reviewed.   ED Course  Procedures (including critical care  time) Labs Review Labs Reviewed  CBC WITH DIFFERENTIAL/PLATELET - Abnormal; Notable for the following:    Neutro Abs 8.1 (*)    All other components within normal limits  COMPREHENSIVE METABOLIC PANEL - Abnormal; Notable for the following:    Sodium 131 (*)    Chloride 96 (*)    Glucose, Bld 238 (*)    All other components within normal limits  I-STAT CG4 LACTIC ACID, ED - Abnormal; Notable for the following:    Lactic Acid, Venous 2.12 (*)    All other components within normal limits  LIPASE, BLOOD  URINALYSIS, ROUTINE W REFLEX MICROSCOPIC (NOT AT [facility])  I-STAT CG4 LACTIC ACID, ED    Imaging Review Dg Tibia/fibula Right  02/23/2016  CLINICAL DATA:  Vomiting.  Swollen leg. EXAM: RIGHT TIBIA AND FIBULA - 2 VIEW COMPARISON:  None. FINDINGS: There is no evidence of fracture or other focal bone lesions. Soft tissue swelling without visible radiopaque foreign body. IMPRESSION: Negative. Electronically Signed   By: Elsie Stain M.D.   On: 02/23/2016 09:34   Ct Head Wo Contrast  02/23/2016  CLINICAL DATA:  Headache.  History of migraines. EXAM: CT HEAD WITHOUT CONTRAST TECHNIQUE: Contiguous axial images were obtained from the base of the skull through the vertex without intravenous contrast. COMPARISON:  05/09/2011 brain MRI report, images not available for review. FINDINGS: Skull and Sinuses:Negative for fracture or destructive process. The visualized mastoids, middle ears, and imaged paranasal sinuses are clear. Visualized orbits: Negative. Brain: Unremarkable. No evidence of acute infarction, hemorrhage, hydrocephalus, or mass lesion/mass effect. IMPRESSION: Negative exam.  No explanation for headache. Electronically Signed   By: Marnee Spring M.D.   On: 02/23/2016 12:16  Dg Hand Complete Right  02/23/2016  CLINICAL DATA:  RIGHT hand pain. Fell. Bruising. Incident occurred 2 weeks ago. EXAM: RIGHT HAND - COMPLETE 3+ VIEW COMPARISON:  02/04/2016 FINDINGS: There is no evidence of fracture or  dislocation. There is no evidence of arthropathy or other focal bone abnormality. Soft tissues are unremarkable. IMPRESSION: Negative with no change from priors. Electronically Signed   By: Elsie Stain M.D.   On: 02/23/2016 11:04   I have personally reviewed and evaluated these images and lab results as part of my medical decision-making.   EKG Interpretation   Date/Time:  Wednesday February 23 2016 09:37:13 EDT Ventricular Rate:  97 PR Interval:  198 QRS Duration: 86 QT Interval:  356 QTC Calculation: 452 R Axis:   -25 Text Interpretation:  Normal sinus rhythm Low voltage QRS Cannot rule out  Anterior infarct , age undetermined Abnormal ECG No significant change  since last tracing Confirmed by Tyrone Apple ([id number]) on 02/23/2016 9:42:14  AM      MDM  Patient was seen and evaluated in stable condition. Physical examination unremarkable other than the fact that the patient appears to feel unwell and the fact that she has findings of cellulitis in her right lower extremity. She was alert and oriented. Normal neurologic examination. Laboratory results with minimally elevated lactic acid, hyperglycemia but otherwise unremarkable. Patient was given IV fluids as well as a migraine cocktail. CT head was unremarkable. X-ray of the lower extremity was negative for subcutaneous gas. Patient felt much better after treatment. She was given 1 dose of IV vancomycin for cellulitis. She was discharged home on Bactrim. She was instructed to follow-up outpatient with her primary care physician for reevaluation. She was also instructed to follow-up with her primary care physician for medication adjustments as her symptoms may have been related to too much opioid. Final diagnoses:  Cellulitis of right lower extremity  Nausea    1. Cellulitis    Leta Baptist, MD 02/23/16 1640

## 2016-03-09 ENCOUNTER — Inpatient Hospital Stay (HOSPITAL_BASED_OUTPATIENT_CLINIC_OR_DEPARTMENT_OTHER)
Admission: EM | Admit: 2016-03-09 | Discharge: 2016-03-11 | DRG: 872 | Disposition: A | Discharge status: Home / Self Care | Payer: BLUE CROSS/BLUE SHIELD | Attending: Internal Medicine | Admitting: Internal Medicine

## 2016-03-09 ENCOUNTER — Encounter (HOSPITAL_BASED_OUTPATIENT_CLINIC_OR_DEPARTMENT_OTHER): Payer: Self-pay | Admitting: Emergency Medicine

## 2016-03-09 DIAGNOSIS — M797 Fibromyalgia: Secondary | ICD-10-CM | POA: Diagnosis present

## 2016-03-09 DIAGNOSIS — J4550 Severe persistent asthma, uncomplicated: Secondary | ICD-10-CM | POA: Diagnosis present

## 2016-03-09 DIAGNOSIS — Z6841 Body Mass Index (BMI) 40.0 and over, adult: Secondary | ICD-10-CM

## 2016-03-09 DIAGNOSIS — M358 Other specified systemic involvement of connective tissue: Secondary | ICD-10-CM | POA: Diagnosis present

## 2016-03-09 DIAGNOSIS — L97419 Non-pressure chronic ulcer of right heel and midfoot with unspecified severity: Secondary | ICD-10-CM | POA: Diagnosis present

## 2016-03-09 DIAGNOSIS — E119 Type 2 diabetes mellitus without complications: Secondary | ICD-10-CM

## 2016-03-09 DIAGNOSIS — E785 Hyperlipidemia, unspecified: Secondary | ICD-10-CM | POA: Diagnosis present

## 2016-03-09 DIAGNOSIS — Z91013 Allergy to seafood: Secondary | ICD-10-CM | POA: Diagnosis not present

## 2016-03-09 DIAGNOSIS — Z888 Allergy status to other drugs, medicaments and biological substances status: Secondary | ICD-10-CM | POA: Diagnosis not present

## 2016-03-09 DIAGNOSIS — M5416 Radiculopathy, lumbar region: Secondary | ICD-10-CM | POA: Diagnosis present

## 2016-03-09 DIAGNOSIS — G4733 Obstructive sleep apnea (adult) (pediatric): Secondary | ICD-10-CM | POA: Diagnosis present

## 2016-03-09 DIAGNOSIS — Z91010 Allergy to peanuts: Secondary | ICD-10-CM | POA: Diagnosis not present

## 2016-03-09 DIAGNOSIS — A419 Sepsis, unspecified organism: Principal | ICD-10-CM | POA: Diagnosis present

## 2016-03-09 DIAGNOSIS — Z8249 Family history of ischemic heart disease and other diseases of the circulatory system: Secondary | ICD-10-CM

## 2016-03-09 DIAGNOSIS — E1140 Type 2 diabetes mellitus with diabetic neuropathy, unspecified: Secondary | ICD-10-CM | POA: Diagnosis present

## 2016-03-09 DIAGNOSIS — Z79899 Other long term (current) drug therapy: Secondary | ICD-10-CM

## 2016-03-09 DIAGNOSIS — R112 Nausea with vomiting, unspecified: Secondary | ICD-10-CM | POA: Diagnosis not present

## 2016-03-09 DIAGNOSIS — E039 Hypothyroidism, unspecified: Secondary | ICD-10-CM | POA: Diagnosis present

## 2016-03-09 DIAGNOSIS — Z880 Allergy status to penicillin: Secondary | ICD-10-CM

## 2016-03-09 DIAGNOSIS — R894 Abnormal immunological findings in specimens from other organs, systems and tissues: Secondary | ICD-10-CM | POA: Diagnosis not present

## 2016-03-09 DIAGNOSIS — M722 Plantar fascial fibromatosis: Secondary | ICD-10-CM | POA: Diagnosis present

## 2016-03-09 DIAGNOSIS — E11621 Type 2 diabetes mellitus with foot ulcer: Secondary | ICD-10-CM | POA: Diagnosis present

## 2016-03-09 DIAGNOSIS — Z825 Family history of asthma and other chronic lower respiratory diseases: Secondary | ICD-10-CM | POA: Diagnosis not present

## 2016-03-09 DIAGNOSIS — Z91018 Allergy to other foods: Secondary | ICD-10-CM | POA: Diagnosis not present

## 2016-03-09 DIAGNOSIS — Z803 Family history of malignant neoplasm of breast: Secondary | ICD-10-CM | POA: Diagnosis not present

## 2016-03-09 DIAGNOSIS — Z800 Family history of malignant neoplasm of digestive organs: Secondary | ICD-10-CM | POA: Diagnosis not present

## 2016-03-09 DIAGNOSIS — K219 Gastro-esophageal reflux disease without esophagitis: Secondary | ICD-10-CM | POA: Diagnosis present

## 2016-03-09 DIAGNOSIS — I10 Essential (primary) hypertension: Secondary | ICD-10-CM | POA: Diagnosis present

## 2016-03-09 DIAGNOSIS — Z7982 Long term (current) use of aspirin: Secondary | ICD-10-CM | POA: Diagnosis not present

## 2016-03-09 DIAGNOSIS — Z955 Presence of coronary angioplasty implant and graft: Secondary | ICD-10-CM | POA: Diagnosis not present

## 2016-03-09 DIAGNOSIS — Z794 Long term (current) use of insulin: Secondary | ICD-10-CM | POA: Diagnosis not present

## 2016-03-09 DIAGNOSIS — K580 Irritable bowel syndrome with diarrhea: Secondary | ICD-10-CM | POA: Diagnosis present

## 2016-03-09 DIAGNOSIS — R197 Diarrhea, unspecified: Secondary | ICD-10-CM

## 2016-03-09 DIAGNOSIS — G894 Chronic pain syndrome: Secondary | ICD-10-CM | POA: Diagnosis present

## 2016-03-09 DIAGNOSIS — M3589 Other specified systemic involvement of connective tissue: Secondary | ICD-10-CM | POA: Diagnosis present

## 2016-03-09 DIAGNOSIS — Z7951 Long term (current) use of inhaled steroids: Secondary | ICD-10-CM | POA: Diagnosis not present

## 2016-03-09 DIAGNOSIS — M351 Other overlap syndromes: Secondary | ICD-10-CM | POA: Diagnosis present

## 2016-03-09 DIAGNOSIS — L03115 Cellulitis of right lower limb: Secondary | ICD-10-CM | POA: Diagnosis present

## 2016-03-09 DIAGNOSIS — R768 Other specified abnormal immunological findings in serum: Secondary | ICD-10-CM | POA: Diagnosis present

## 2016-03-09 DIAGNOSIS — E1165 Type 2 diabetes mellitus with hyperglycemia: Secondary | ICD-10-CM | POA: Diagnosis present

## 2016-03-09 DIAGNOSIS — I2510 Atherosclerotic heart disease of native coronary artery without angina pectoris: Secondary | ICD-10-CM | POA: Diagnosis present

## 2016-03-09 DIAGNOSIS — Z87891 Personal history of nicotine dependence: Secondary | ICD-10-CM | POA: Diagnosis not present

## 2016-03-09 HISTORY — DX: Chronic pain syndrome: G89.4

## 2016-03-09 LAB — CBC WITH DIFFERENTIAL/PLATELET
Basophils Absolute: 0 10*3/uL (ref 0.0–0.1)
Basophils Relative: 0 %
Eosinophils Absolute: 0 10*3/uL (ref 0.0–0.7)
Eosinophils Relative: 0 %
HCT: 41.8 % (ref 36.0–46.0)
Hemoglobin: 13.4 g/dL (ref 12.0–15.0)
Lymphocytes Relative: 7 %
Lymphs Abs: 1 10*3/uL (ref 0.7–4.0)
MCH: 28.5 pg (ref 26.0–34.0)
MCHC: 32.1 g/dL (ref 30.0–36.0)
MCV: 88.7 fL (ref 78.0–100.0)
Monocytes Absolute: 0.8 10*3/uL (ref 0.1–1.0)
Monocytes Relative: 6 %
Neutro Abs: 12.4 10*3/uL — ABNORMAL HIGH (ref 1.7–7.7)
Neutrophils Relative %: 87 %
Platelets: 213 10*3/uL (ref 150–400)
RBC: 4.71 MIL/uL (ref 3.87–5.11)
RDW: 14.9 % (ref 11.5–15.5)
WBC: 14.1 10*3/uL — ABNORMAL HIGH (ref 4.0–10.5)

## 2016-03-09 LAB — URINALYSIS, ROUTINE W REFLEX MICROSCOPIC
Bilirubin Urine: NEGATIVE
Glucose, UA: NEGATIVE mg/dL
Hgb urine dipstick: NEGATIVE
Ketones, ur: NEGATIVE mg/dL
Leukocytes, UA: NEGATIVE
Nitrite: NEGATIVE
Protein, ur: NEGATIVE mg/dL
Specific Gravity, Urine: 1.017 (ref 1.005–1.030)
pH: 6 (ref 5.0–8.0)

## 2016-03-09 LAB — COMPREHENSIVE METABOLIC PANEL
ALT: 27 U/L (ref 14–54)
AST: 27 U/L (ref 15–41)
Albumin: 3.4 g/dL — ABNORMAL LOW (ref 3.5–5.0)
Alkaline Phosphatase: 48 U/L (ref 38–126)
Anion gap: 10 (ref 5–15)
BUN: 9 mg/dL (ref 6–20)
CO2: 27 mmol/L (ref 22–32)
Calcium: 9 mg/dL (ref 8.9–10.3)
Chloride: 97 mmol/L — ABNORMAL LOW (ref 101–111)
Creatinine, Ser: 0.81 mg/dL (ref 0.44–1.00)
GFR calc Af Amer: >60 mL/min (ref >60)
GFR calc non Af Amer: >60 mL/min (ref >60)
Glucose, Bld: 214 mg/dL — ABNORMAL HIGH (ref 65–99)
Potassium: 3.7 mmol/L (ref 3.5–5.1)
Sodium: 134 mmol/L — ABNORMAL LOW (ref 135–145)
Total Bilirubin: 0.8 mg/dL (ref 0.3–1.2)
Total Protein: 6.4 g/dL — ABNORMAL LOW (ref 6.5–8.1)

## 2016-03-09 LAB — CBC
HCT: 40.1 % (ref 36.0–46.0)
Hemoglobin: 13.5 g/dL (ref 12.0–15.0)
MCH: 29 pg (ref 26.0–34.0)
MCHC: 33.7 g/dL (ref 30.0–36.0)
MCV: 86.2 fL (ref 78.0–100.0)
Platelets: 204 10*3/uL (ref 150–400)
RBC: 4.65 MIL/uL (ref 3.87–5.11)
RDW: 14.4 % (ref 11.5–15.5)
WBC: 11.5 10*3/uL — ABNORMAL HIGH (ref 4.0–10.5)

## 2016-03-09 LAB — I-STAT CG4 LACTIC ACID, ED: Lactic Acid, Venous: 1.43 mmol/L (ref 0.5–1.9)

## 2016-03-09 LAB — LIPASE, BLOOD: Lipase: 13 U/L (ref 11–51)

## 2016-03-09 LAB — CREATININE, SERUM
Creatinine, Ser: 0.83 mg/dL (ref 0.44–1.00)
GFR calc Af Amer: >60 mL/min (ref >60)
GFR calc non Af Amer: >60 mL/min (ref >60)

## 2016-03-09 LAB — PROTIME-INR
INR: 1.21 (ref 0.00–1.49)
Prothrombin Time: 15 seconds (ref 11.6–15.2)

## 2016-03-09 LAB — GLUCOSE, CAPILLARY: Glucose-Capillary: 213 mg/dL — ABNORMAL HIGH (ref 65–99)

## 2016-03-09 LAB — LACTIC ACID, PLASMA
Lactic Acid, Venous: 1.3 mmol/L (ref 0.5–1.9)
Lactic Acid, Venous: 1.8 mmol/L (ref 0.5–1.9)

## 2016-03-09 LAB — TSH: TSH: 0.028 u[IU]/mL — ABNORMAL LOW (ref 0.350–4.500)

## 2016-03-09 MED ORDER — LEVOTHYROXINE SODIUM 100 MCG PO TABS
200.0000 ug | ORAL_TABLET | ORAL | Status: DC
  Administered 2016-03-10: 200 ug via ORAL
  Filled 2016-03-09 (×2): qty 2

## 2016-03-09 MED ORDER — HYDROXYCHLOROQUINE SULFATE 200 MG PO TABS
200.0000 mg | ORAL_TABLET | Freq: Two times a day (BID) | ORAL | Status: DC
  Administered 2016-03-10 – 2016-03-11 (×4): 200 mg via ORAL
  Filled 2016-03-09 (×4): qty 1

## 2016-03-09 MED ORDER — MAGNESIUM OXIDE 400 (241.3 MG) MG PO TABS
400.0000 mg | ORAL_TABLET | Freq: Every evening | ORAL | Status: DC
  Administered 2016-03-09 – 2016-03-10 (×2): 400 mg via ORAL
  Filled 2016-03-09 (×3): qty 1

## 2016-03-09 MED ORDER — SODIUM CHLORIDE 0.9 % IV SOLN
INTRAVENOUS | Status: AC
  Administered 2016-03-09: 21:00:00 via INTRAVENOUS

## 2016-03-09 MED ORDER — LORATADINE 10 MG PO TABS
10.0000 mg | ORAL_TABLET | Freq: Every day | ORAL | Status: DC
  Administered 2016-03-10 – 2016-03-11 (×2): 10 mg via ORAL
  Filled 2016-03-09 (×2): qty 1

## 2016-03-09 MED ORDER — FENTANYL 100 MCG/HR TD PT72
100.0000 ug | MEDICATED_PATCH | TRANSDERMAL | Status: DC
  Administered 2016-03-09: 100 ug via TRANSDERMAL
  Filled 2016-03-09: qty 1

## 2016-03-09 MED ORDER — VANCOMYCIN HCL IN DEXTROSE 1-5 GM/200ML-% IV SOLN
1000.0000 mg | Freq: Once | INTRAVENOUS | Status: AC
  Administered 2016-03-09: 1000 mg via INTRAVENOUS
  Filled 2016-03-09: qty 200

## 2016-03-09 MED ORDER — ASPIRIN EC 81 MG PO TBEC
81.0000 mg | DELAYED_RELEASE_TABLET | Freq: Every day | ORAL | Status: DC
  Administered 2016-03-10 – 2016-03-11 (×2): 81 mg via ORAL
  Filled 2016-03-09 (×2): qty 1

## 2016-03-09 MED ORDER — BUDESONIDE 0.25 MG/2ML IN SUSP
0.2500 mg | Freq: Two times a day (BID) | RESPIRATORY_TRACT | Status: DC
  Administered 2016-03-09 – 2016-03-11 (×4): 0.25 mg via RESPIRATORY_TRACT
  Filled 2016-03-09 (×4): qty 2

## 2016-03-09 MED ORDER — HYDROCODONE-ACETAMINOPHEN 5-325 MG PO TABS: 1.0000 | ORAL_TABLET | ORAL | Status: DC | PRN

## 2016-03-09 MED ORDER — ONDANSETRON HCL 4 MG/2ML IJ SOLN: 4.0000 mg | Freq: Four times a day (QID) | INTRAMUSCULAR | Status: DC | PRN

## 2016-03-09 MED ORDER — SACCHAROMYCES BOULARDII 250 MG PO CAPS
250.0000 mg | ORAL_CAPSULE | Freq: Two times a day (BID) | ORAL | Status: DC
  Administered 2016-03-09 – 2016-03-11 (×4): 250 mg via ORAL
  Filled 2016-03-09 (×4): qty 1

## 2016-03-09 MED ORDER — METOPROLOL TARTRATE 25 MG PO TABS
25.0000 mg | ORAL_TABLET | Freq: Two times a day (BID) | ORAL | Status: DC
  Administered 2016-03-10 – 2016-03-11 (×4): 25 mg via ORAL
  Filled 2016-03-09 (×4): qty 1

## 2016-03-09 MED ORDER — INSULIN REGULAR HUMAN (CONC) 500 UNIT/ML ~~LOC~~ SOPN
50.0000 [IU] | PEN_INJECTOR | Freq: Three times a day (TID) | SUBCUTANEOUS | Status: DC
  Administered 2016-03-10 – 2016-03-11 (×4): 50 [IU] via SUBCUTANEOUS
  Filled 2016-03-09: qty 3

## 2016-03-09 MED ORDER — FUROSEMIDE 20 MG PO TABS
20.0000 mg | ORAL_TABLET | Freq: Every day | ORAL | Status: DC
  Administered 2016-03-11: 20 mg via ORAL
  Filled 2016-03-09 (×2): qty 1

## 2016-03-09 MED ORDER — INSULIN ASPART 100 UNIT/ML ~~LOC~~ SOLN: 0.0000 [IU] | Freq: Every day | SUBCUTANEOUS | Status: DC

## 2016-03-09 MED ORDER — VANCOMYCIN HCL IN DEXTROSE 1-5 GM/200ML-% IV SOLN
1000.0000 mg | Freq: Two times a day (BID) | INTRAVENOUS | Status: DC
  Administered 2016-03-10: 1000 mg via INTRAVENOUS
  Filled 2016-03-09 (×2): qty 200

## 2016-03-09 MED ORDER — OXYMETAZOLINE HCL 0.05 % NA SOLN
1.0000 | Freq: Two times a day (BID) | NASAL | Status: DC | PRN
  Filled 2016-03-09: qty 15

## 2016-03-09 MED ORDER — ALBUTEROL SULFATE (2.5 MG/3ML) 0.083% IN NEBU
2.5000 mL | INHALATION_SOLUTION | RESPIRATORY_TRACT | Status: DC | PRN
  Administered 2016-03-10: 2.5 mL via RESPIRATORY_TRACT
  Filled 2016-03-09 (×2): qty 3

## 2016-03-09 MED ORDER — OXYCODONE HCL 5 MG PO TABS: 10.0000 mg | ORAL_TABLET | ORAL | Status: DC | PRN

## 2016-03-09 MED ORDER — ENALAPRIL MALEATE 10 MG PO TABS
10.0000 mg | ORAL_TABLET | Freq: Two times a day (BID) | ORAL | Status: DC
  Administered 2016-03-10 – 2016-03-11 (×4): 10 mg via ORAL
  Filled 2016-03-09 (×4): qty 1

## 2016-03-09 MED ORDER — DEXTROSE 5 % IV SOLN
2.0000 g | INTRAVENOUS | Status: DC
  Administered 2016-03-09 – 2016-03-10 (×2): 2 g via INTRAVENOUS
  Filled 2016-03-09 (×4): qty 2

## 2016-03-09 MED ORDER — ONDANSETRON HCL 4 MG PO TABS: 4.0000 mg | ORAL_TABLET | Freq: Four times a day (QID) | ORAL | Status: DC | PRN

## 2016-03-09 MED ORDER — INSULIN ASPART 100 UNIT/ML ~~LOC~~ SOLN
0.0000 [IU] | Freq: Three times a day (TID) | SUBCUTANEOUS | Status: DC
  Administered 2016-03-10 (×2): 3 [IU] via SUBCUTANEOUS

## 2016-03-09 MED ORDER — METOCLOPRAMIDE HCL 5 MG/ML IJ SOLN
10.0000 mg | Freq: Once | INTRAMUSCULAR | Status: AC
  Administered 2016-03-09: 10 mg via INTRAVENOUS
  Filled 2016-03-09: qty 2

## 2016-03-09 MED ORDER — LEVOTHYROXINE SODIUM 150 MCG PO TABS
225.0000 ug | ORAL_TABLET | ORAL | Status: DC
  Administered 2016-03-11: 225 ug via ORAL

## 2016-03-09 MED ORDER — LEVOTHYROXINE SODIUM 25 MCG PO TABS: 25.0000 ug | ORAL_TABLET | ORAL | Status: DC

## 2016-03-09 MED ORDER — METRONIDAZOLE IN NACL 5-0.79 MG/ML-% IV SOLN
500.0000 mg | Freq: Three times a day (TID) | INTRAVENOUS | Status: DC
  Administered 2016-03-09 – 2016-03-11 (×6): 500 mg via INTRAVENOUS
  Filled 2016-03-09 (×6): qty 100

## 2016-03-09 MED ORDER — CIPROFLOXACIN IN D5W 400 MG/200ML IV SOLN
400.0000 mg | Freq: Once | INTRAVENOUS | Status: AC
  Administered 2016-03-09: 400 mg via INTRAVENOUS
  Filled 2016-03-09: qty 200

## 2016-03-09 MED ORDER — TICAGRELOR 90 MG PO TABS
90.0000 mg | ORAL_TABLET | Freq: Two times a day (BID) | ORAL | Status: DC
  Administered 2016-03-10 – 2016-03-11 (×4): 90 mg via ORAL
  Filled 2016-03-09 (×4): qty 1

## 2016-03-09 MED ORDER — ENOXAPARIN SODIUM 40 MG/0.4ML ~~LOC~~ SOLN
40.0000 mg | SUBCUTANEOUS | Status: DC
  Administered 2016-03-09: 40 mg via SUBCUTANEOUS
  Filled 2016-03-09: qty 0.4

## 2016-03-09 NOTE — Progress Notes (Addendum)
Pharmacy Antibiotic Note  [name] is a 55 y.o. female admitted on 03/09/2016 with cellulitis.  Pharmacy has been consulted for vancomycin dosing. Cipro also ordered x1 per MD at [facility]. AF, SCr 0.81 on admit, normalized CrCl~73.  Plan: Cipro x1 per MD Vanc 2g IV x1; then 1g IV q12h Monitor clinical progress, c/s, renal function, abx plan/LOT VT@SS  as indicated  Height: 5\' 2"  (157.5 cm) Weight: 270 lb (122.471 kg) IBW/kg (Calculated) : 50.1  Temp (24hrs), Avg:99.1 F (37.3 C), Min:99.1 F (37.3 C), Max:99.1 F (37.3 C)  No results for input(s): WBC, CREATININE, LATICACIDVEN, VANCOTROUGH, VANCOPEAK, VANCORANDOM, GENTTROUGH, GENTPEAK, GENTRANDOM, TOBRATROUGH, TOBRAPEAK, TOBRARND, AMIKACINPEAK, AMIKACINTROU, AMIKACIN in the last 168 hours.  Estimated Creatinine Clearance: 90.2 mL/min (by C-G formula based on Cr of 0.89).    Allergies  Allergen Reactions  . Fish Allergy Anaphylaxis  . Fish Oil [Omega-3 Fatty Acids] Swelling    Throat swelling   . Gabapentin Other (See Comments)    Anxiety on high doses  . Ipratropium Shortness Of Breath  . Ipratropium-Albuterol Other (See Comments) and Shortness Of Breath    Wheezing becomes worse, can take albuterol with ipratropium  . Peanut-Containing Drug Products Shortness Of Breath  . Red Yeast Rice [Cholestin] Other (See Comments)    Muscle pain and severe joint pain.   . Atrovent Other (See Comments)    Wheezing becomes worse  . Crestor [Rosuvastatin] Other (See Comments)    Extreme joint pain Extreme joint pain, to this & other statins  . Suprep [Na Sulfate-K Sulfate-Mg Sulf] Nausea And Vomiting  . Tape     Electrodes causes skin breakdown  . Penicillins Other (See Comments) and Rash    Unknown childhood reaction     Antimicrobials this admission: 6/29 cipro x1 6/29 vanc >>   Dose adjustments this admission:   Microbiology results: 6/29 BCx:    7/29, PharmD, BCPS Clinical Pharmacist Pager [phone] 03/09/2016  1:48 PM

## 2016-03-09 NOTE — ED Provider Notes (Signed)
CSN: [id number]     Arrival date & time 03/09/16  1217 History   First MD Initiated Contact with Patient 03/09/16 1320     Chief Complaint  Patient presents with  . Emesis     (Consider location/radiation/quality/duration/timing/severity/associated sxs/prior Treatment) HPI Complains of vomiting onset 5 or 6 times since yesterday. Also complains of red warm feeling right leg onset yesterday. She treated self with Phenergan. Vomiting continues. Maximum temperature 100 all at home. Denies abdominal pain. She complains of low back pain which is chronic for 6 months and mild at present. No other associated symptoms. Nothing makes symptoms better or worse. Nothing makes symptoms better or worse. Past Medical History  Diagnosis Date  . Asthma   . Hypertension   . Diabetes mellitus   . IBS (irritable bowel syndrome)   . GERD (gastroesophageal reflux disease)   . Eczema   . Thyroid disease   . Mixed connective tissue disease (HCC)   . C. difficile diarrhea   . Hyperlipidemia   . CHF (congestive heart failure) (HCC) 09/2015    cath with normal LM, 30% LAD, 85% mid RCA and 95% distal RCA s/p PCI of the mid to distal RCA and now on DAPT with ASA and Ticagrelor.    . Excessive daytime sleepiness 11/26/2015  . Coronary artery disease   Sepsis Past Surgical History  Procedure Laterality Date  . Breast surgery    . Cholecystectomy    . Abdominal hysterectomy    . Appendectomy    . Leg surgery Left     muscle biospy  . Wrist surgery Left   . Cardiac catheterization    . Coronary angioplasty      normal LM, 30% LAD, 85% mid RCA and 95% distal RCA s/p PCI of the mid to distal RCA and now on DAPT with ASA and Ticagrelor.     Family History  Problem Relation Age of Onset  . CAD Mother   . Hypertension Mother   . Heart attack Mother   . CAD Father   . Heart attack Father   . Allergic rhinitis Father   . Asthma Father   . Hypertension Brother   . Hypertension Brother   . Pancreatic cancer  Paternal Aunt   . Breast cancer Paternal Aunt    Social History  Substance Use Topics  . Smoking status: Former Smoker -- 1.00 packs/day for 20 years    Types: Cigarettes    Quit date: 02/11/2004  . Smokeless tobacco: Never Used  . Alcohol Use: Yes     Comment: once a month   OB History    No data available     Review of Systems  Gastrointestinal: Positive for nausea and vomiting.  Musculoskeletal: Positive for back pain and neck pain.       Chronic neck pain and back pain  Skin: Positive for color change and wound.       Reddened right leg. Chronic wound at right heel for past 6 months  Allergic/Immunologic: Positive for immunocompromised state.       Diabetic  All other systems reviewed and are negative.     Allergies  Fish allergy; Fish oil; Gabapentin; Ipratropium; Ipratropium-albuterol; Peanut-containing drug products; Red yeast rice; Atrovent; Crestor; Suprep; Tape; and Penicillins  Home Medications   Prior to Admission medications   Medication Sig Start Date End Date Taking? Authorizing Provider  albuterol (PROVENTIL HFA;VENTOLIN HFA) 108 (90 BASE) MCG/ACT inhaler Inhale 2 puffs into the lungs every 4 (four) hours  as needed for wheezing or shortness of breath. 11/14/12   Richarda Overlie, MD  albuterol (PROVENTIL) (2.5 MG/3ML) 0.083% nebulizer solution Inhale 2.5 mLs into the lungs as directed. 09/24/15   Historical Provider, MD  Alpha Lipoic Acid 200 MG CAPS Take 600 mg by mouth 3 (three) times daily.    Historical Provider, MD  aspirin EC 81 MG tablet Take 81 mg by mouth daily.    Historical Provider, MD  b complex vitamins tablet Take 1 tablet by mouth daily.    Historical Provider, MD  beclomethasone (QVAR) 40 MCG/ACT inhaler ONE PUFF TWICE A DAY TO PREVENT COUGH OR WHEEZE. USE SPACER. 02/10/16   Fletcher Anon, MD  betamethasone dipropionate (DIPROLENE) 0.05 % cream Apply 1 application topically daily as needed (eczema).    Historical Provider, MD  BRILINTA 90 MG TABS  tablet Take 90 mg by mouth 2 (two) times daily. 11/25/15   Historical Provider, MD  cetirizine (ZYRTEC) 10 MG tablet Take 10 mg by mouth daily as needed for allergies.    Historical Provider, MD  clotrimazole-betamethasone (LOTRISONE) cream Apply 1 application topically daily as needed (eczema).    Historical Provider, MD  diphenhydrAMINE (BENADRYL) 25 MG tablet Take 1 tablet (25 mg total) by mouth every 6 (six) hours as needed for itching or allergies. 04/05/13   Fayrene Helper, PA-C  enalapril (VASOTEC) 10 MG tablet Take 10 mg by mouth 2 (two) times daily.    Historical Provider, MD  EPINEPHrine (EPIPEN 2-PAK) 0.3 mg/0.3 mL IJ SOAJ injection USE AS DIRECTED FOR SEVERE ALLERGIC REACTION. 02/10/16   Fletcher Anon, MD  EPINEPHrine 0.3 mg/0.3 mL IJ SOAJ injection Inject 0.3 mg into the muscle once as needed.    Historical Provider, MD  Estrogens, Conjugated (PREMARIN VA) Place 1 application vaginally 3 (three) times a week. On Saturdays, Mondays, and Wednesday (cream)    Historical Provider, MD  fentaNYL (DURAGESIC - DOSED MCG/HR) 100 MCG/HR Place 1 patch onto the skin every 3 (three) days.      Historical Provider, MD  fluticasone (FLONASE) 50 MCG/ACT nasal spray Place 2 sprays into the nose 2 (two) times daily.      Historical Provider, MD  furosemide (LASIX) 20 MG tablet Take 20 mg by mouth 3 (three) times daily as needed for fluid.    Historical Provider, MD  furosemide (LASIX) 40 MG tablet Take 40 mg by mouth.    Historical Provider, MD  gabapentin (NEURONTIN) 100 MG capsule Take 400-700 mg by mouth See admin instructions. Pt takes 400mg  in the morning, 400mg  at lunchtime and 700mg  in the evening    Historical Provider, MD  GLUCOMANNAN PO Take 700 mg by mouth 2 (two) times daily.    Historical Provider, MD  glucose blood (BAYER CONTOUR TEST) test strip  01/26/16   Historical Provider, MD  Guaifenesin (MUCINEX MAXIMUM STRENGTH) 1200 MG TB12 Take 1,200 mg by mouth 2 (two) times daily.    Historical  Provider, MD  hydroxychloroquine (PLAQUENIL) 200 MG tablet Take 200 mg by mouth 2 (two) times daily.    Historical Provider, MD  ibuprofen (ADVIL,MOTRIN) 400 MG tablet Take 1 tablet (400 mg total) by mouth every 6 (six) hours as needed. 02/04/16   , MD  insulin regular human CONCENTRATED (HUMULIN R) 500 UNIT/ML SOLN injection Inject 10-40 Units into the skin 3 (three) times daily with meals. Based on sliding scale    Historical Provider, MD  Insulin Syringe-Needle U-100 (EASY TOUCH INSULIN SYRINGE) 30G X 5/16"  0.5 ML MISC  01/26/16   Historical Provider, MD  levalbuterol (XOPENEX) 1.25 MG/0.5ML nebulizer solution Take 1 ampule by nebulization 4 (four) times daily as needed for wheezing or shortness of breath.    Historical Provider, MD  levothyroxine (SYNTHROID, LEVOTHROID) 200 MCG tablet Take 200 mcg by mouth daily. Take with 2 25 mcg tablets for a 250 mcg dose    Historical Provider, MD  levothyroxine (SYNTHROID, LEVOTHROID) 25 MCG tablet Take 25 mcg by mouth daily. 10/20/15   Historical Provider, MD  magnesium oxide (MAG-OX) 400 MG tablet Take 400 mg by mouth 3 (three) times daily.    Historical Provider, MD  metFORMIN (GLUCOPHAGE-XR) 500 MG 24 hr tablet Take 1,000 mg by mouth 2 (two) times daily. 11/25/15   Historical Provider, MD  metoCLOPramide (REGLAN) 5 MG tablet Take 1 tablet (5 mg total) by mouth 4 (four) times daily -  before meals and at bedtime. 02/13/15   Osvaldo Shipper, MD  metoprolol tartrate (LOPRESSOR) 25 MG tablet Take 25 mg by mouth 2 (two) times daily. 11/25/15   Historical Provider, MD  milk thistle 175 MG tablet Take 1,000 mg by mouth 2 (two) times daily.    Historical Provider, MD  Multiple Vitamin (MULTIVITAMIN) tablet Take 1 tablet by mouth daily.    Historical Provider, MD  mupirocin cream (BACTROBAN) 2 % Apply 1 application topically daily as needed (eczema).    Historical Provider, MD  omeprazole (PRILOSEC) 20 MG capsule Take 20 mg by mouth.    Historical Provider, MD   ondansetron (ZOFRAN) 4 MG tablet Take 1 tablet (4 mg total) by mouth every 8 (eight) hours as needed for nausea or vomiting. 02/23/16   Leta Baptist, MD  Oxycodone HCl 10 MG TABS Take 10 mg by mouth every 4 (four) hours as needed (for pain).    Historical Provider, MD  oxymetazoline (AFRIN) 0.05 % nasal spray Place 1 spray into both nostrils 2 (two) times daily as needed for congestion.    Historical Provider, MD  Polyethyl Glycol-Propyl Glycol (SYSTANE ULTRA OP) Apply 1 drop to eye daily as needed (dry eyes).    Historical Provider, MD  promethazine (PHENERGAN) 25 MG tablet Take 25 mg by mouth every 6 (six) hours as needed for nausea or vomiting.    Historical Provider, MD  quiNINE (QUALAQUIN) 324 MG capsule Take 648 mg by mouth daily as needed (inflammation).    Historical Provider, MD  saccharomyces boulardii (FLORASTOR) 250 MG capsule Take 1 capsule (250 mg total) by mouth 2 (two) times daily. 02/13/15   Osvaldo Shipper, MD  vitamin A 25956 UNIT capsule Take 10,000 Units by mouth daily.    Historical Provider, MD  vitamin B-12 (CYANOCOBALAMIN) 1000 MCG tablet Take 1,000 mcg by mouth daily.      Historical Provider, MD  Vitamin D, Ergocalciferol, (DRISDOL) 50000 units CAPS capsule Take 1 capsule by mouth once a week. 11/25/15   Historical Provider, MD  Vitamins-Lipotropics (LIPO-FLAVONOID PLUS PO) Take 1 tablet by mouth 3 (three) times daily.    Historical Provider, MD   BP 110/64 mmHg  Pulse 88  Temp(Src) 99.1 F (37.3 C) (Oral)  Resp 20  Ht 5\' 2"  (1.575 m)  Wt 270 lb (122.471 kg)  BMI 49.37 kg/m2  SpO2 94% Physical Exam  Constitutional: She appears well-developed and well-nourished. No distress.  HENT:  Head: Normocephalic and atraumatic.  Eyes: Conjunctivae are normal. Pupils are equal, round, and reactive to light.  Neck: Neck supple. No tracheal deviation present.  No thyromegaly present.  Cardiovascular: Normal rate and regular rhythm.   No murmur heard. Pulmonary/Chest: Effort  normal and breath sounds normal.  Abdominal: Soft. Bowel sounds are normal. She exhibits no distension. There is no tenderness.  Obese  Musculoskeletal: Normal range of motion. She exhibits no edema or tenderness.  Neurological: She is alert. Coordination normal.  Skin: Skin is warm and dry. No rash noted.  Right lower extremity 2 cm scabbed lesion at heel. Calf and shin are reddened and warm at distal two thirds. No tenderness. No red streaks up the leg. DP pulse 2+. No soft tissue swelling. All other extremities without redness swelling or tenderness neurovascularly intact  Psychiatric: She has a normal mood and affect.  Nursing note and vitals reviewed.   ED Course  Procedures (including critical care time) Labs Review Labs Reviewed  COMPREHENSIVE METABOLIC PANEL  CBC  URINALYSIS, ROUTINE W REFLEX MICROSCOPIC (NOT AT [facility])  LIPASE, BLOOD    Imaging Review No results found. I have personally reviewed and evaluated these images and lab results as part of my medical decision-making.   EKG Interpretation None     3:20 PM Patient feels improved after treatment with intravenous antiemetics and antibiotics. Results for orders placed or performed during the hospital encounter of 03/09/16  Comprehensive metabolic panel  Result Value Ref Range   Sodium 134 (L) 135 - 145 mmol/L   Potassium 3.7 3.5 - 5.1 mmol/L   Chloride 97 (L) 101 - 111 mmol/L   CO2 27 22 - 32 mmol/L   Glucose, Bld 214 (H) 65 - 99 mg/dL   BUN 9 6 - 20 mg/dL   Creatinine, Ser 1.66 0.44 - 1.00 mg/dL   Calcium 9.0 8.9 - 06.3 mg/dL   Total Protein 6.4 (L) 6.5 - 8.1 g/dL   Albumin 3.4 (L) 3.5 - 5.0 g/dL   AST 27 15 - 41 U/L   ALT 27 14 - 54 U/L   Alkaline Phosphatase 48 38 - 126 U/L   Total Bilirubin 0.8 0.3 - 1.2 mg/dL   GFR calc non Af Amer >60 >60 mL/min   GFR calc Af Amer >60 >60 mL/min   Anion gap 10 5 - 15  Urinalysis, Routine w reflex microscopic  Result Value Ref Range   Color, Urine AMBER (A) YELLOW    APPearance CLEAR CLEAR   Specific Gravity, Urine 1.017 1.005 - 1.030   pH 6.0 5.0 - 8.0   Glucose, UA NEGATIVE NEGATIVE mg/dL   Hgb urine dipstick NEGATIVE NEGATIVE   Bilirubin Urine NEGATIVE NEGATIVE   Ketones, ur NEGATIVE NEGATIVE mg/dL   Protein, ur NEGATIVE NEGATIVE mg/dL   Nitrite NEGATIVE NEGATIVE   Leukocytes, UA NEGATIVE NEGATIVE  Lipase, blood  Result Value Ref Range   Lipase 13 11 - 51 U/L  CBC with Differential  Result Value Ref Range   WBC 14.1 (H) 4.0 - 10.5 K/uL   RBC 4.71 3.87 - 5.11 MIL/uL   Hemoglobin 13.4 12.0 - 15.0 g/dL   HCT 01.6 01.0 - 93.2 %   MCV 88.7 78.0 - 100.0 fL   MCH 28.5 26.0 - 34.0 pg   MCHC 32.1 30.0 - 36.0 g/dL   RDW 35.5 73.2 - 20.2 %   Platelets 213 150 - 400 K/uL   Neutrophils Relative % 87 %   Neutro Abs 12.4 (H) 1.7 - 7.7 K/uL   Lymphocytes Relative 7 %   Lymphs Abs 1.0 0.7 - 4.0 K/uL   Monocytes Relative 6 %  Monocytes Absolute 0.8 0.1 - 1.0 K/uL   Eosinophils Relative 0 %   Eosinophils Absolute 0.0 0.0 - 0.7 K/uL   Basophils Relative 0 %   Basophils Absolute 0.0 0.0 - 0.1 K/uL  I-Stat CG4 Lactic Acid, ED  Result Value Ref Range   Lactic Acid, Venous 1.43 0.5 - 1.9 mmol/L   Dg Tibia/fibula Right  02/23/2016  CLINICAL DATA:  Vomiting.  Swollen leg. EXAM: RIGHT TIBIA AND FIBULA - 2 VIEW COMPARISON:  None. FINDINGS: There is no evidence of fracture or other focal bone lesions. Soft tissue swelling without visible radiopaque foreign body. IMPRESSION: Negative. Electronically Signed   By: Elsie Stain M.D.   On: 02/23/2016 09:34   Ct Head Wo Contrast  02/23/2016  CLINICAL DATA:  Headache.  History of migraines. EXAM: CT HEAD WITHOUT CONTRAST TECHNIQUE: Contiguous axial images were obtained from the base of the skull through the vertex without intravenous contrast. COMPARISON:  05/09/2011 brain MRI report, images not available for review. FINDINGS: Skull and Sinuses:Negative for fracture or destructive process. The visualized mastoids,  middle ears, and imaged paranasal sinuses are clear. Visualized orbits: Negative. Brain: Unremarkable. No evidence of acute infarction, hemorrhage, hydrocephalus, or mass lesion/mass effect. IMPRESSION: Negative exam.  No explanation for headache. Electronically Signed   By: Marnee Spring M.D.   On: 02/23/2016 12:16   Dg Hand Complete Right  02/23/2016  CLINICAL DATA:  RIGHT hand pain. Fell. Bruising. Incident occurred 2 weeks ago. EXAM: RIGHT HAND - COMPLETE 3+ VIEW COMPARISON:  02/04/2016 FINDINGS: There is no evidence of fracture or dislocation. There is no evidence of arthropathy or other focal bone abnormality. Soft tissues are unremarkable. IMPRESSION: Negative with no change from priors. Electronically Signed   By: Elsie Stain M.D.   On: 02/23/2016 11:04    MDM  Hospitalist physician Dr.Ikramulllah consulted and accepts patient in transfer to [facility] long hospital. 23 hour observation MedSurg floor Plan intravenous antibiotics, glycemic control. And symptomatic control Diagnosis #1 cellulitis of right leg #2 nausea and vomiting #3 hyperglycemia Final diagnoses:  None        Doug Sou, MD 03/09/16 1524

## 2016-03-09 NOTE — H&P (Addendum)
TRH H&P   Patient Demographics:    [name], is a 55 y.o. female  MRN: [id number]   DOB - 11-May-1961  Admit Date - 03/09/2016  Outpatient Primary MD for the patient is Cheral Bay, MD  Outpatient Specialists: Dr. Carolanne Grumbling cardiologist    Patient coming from: Home, [facility] [place] [facility]  Chief Complaint  Patient presents with  . Emesis      HPI:    [name]  is a 55 y.o. female, With morbid obesity, recent diagnosis of sleep apnea not on CPAP yet, essential hypertension, CAD status post stent placement, asthma, essential hypertension, DM type II, GERD, hypothyroidism, mixed connective tissue disorder, back pain.  Patient with above history has been having redness off and on for 2-3 weeks in her right leg, she recently finished outpatient course of Bactrim with some improvement, however in the last 3-4 days her right leg has again become red, yesterday she developed fever and chills along with some nausea vomiting and went to the [facility] [place] [facility] where she was diagnosed with right lower extremity cellulitis, she was given vancomycin and Cipro due to penicillin allergies. Blood cultures were drawn and she was sent to [facility] for admission.  She currently is relatively symptom free, denies any headache, no chest pain or abdominal pain, currently no nausea, no diarrhea or dysuria, feels better at this time.    Review of systems:    In addition to the HPI above,   +ve Fever-chills, No Headache, No changes with Vision or hearing, No problems swallowing food or Liquids, No Chest pain, Cough or Shortness of Breath, No Abdominal pain, No Nausea or Vommitting, Bowel movements are  regular, No Blood in stool or Urine, No dysuria, No new skin rashes or bruises, except right leg cellulitis and redness along with small heel ulcer No new joints pains-aches,  No new weakness, tingling, numbness in any extremity, No recent weight gain or loss, No polyuria, polydypsia or polyphagia, No significant Mental Stressors.  A full 10 point Review of Systems was done, except as stated above, all other Review of Systems were negative.   With Past History of the following :    Past Medical History  Diagnosis Date  . Asthma   . Hypertension   . Diabetes mellitus   .  IBS (irritable bowel syndrome)   . GERD (gastroesophageal reflux disease)   . Eczema   . Thyroid disease   . Mixed connective tissue disease (HCC)   . C. difficile diarrhea   . Hyperlipidemia   . CHF (congestive heart failure) (HCC) 09/2015    cath with normal LM, 30% LAD, 85% mid RCA and 95% distal RCA s/p PCI of the mid to distal RCA and now on DAPT with ASA and Ticagrelor.    . Excessive daytime sleepiness 11/26/2015  . Coronary artery disease   . Chronic pain syndrome       Past Surgical History  Procedure Laterality Date  . Breast surgery    . Cholecystectomy    . Abdominal hysterectomy    . Appendectomy    . Leg surgery Left     muscle biospy  . Wrist surgery Left   . Cardiac catheterization    . Coronary angioplasty      normal LM, 30% LAD, 85% mid RCA and 95% distal RCA s/p PCI of the mid to distal RCA and now on DAPT with ASA and Ticagrelor.        Social History:     Social History  Substance Use Topics  . Smoking status: Former Smoker -- 1.00 packs/day for 20 years    Types: Cigarettes    Quit date: 02/11/2004  . Smokeless tobacco: Never Used  . Alcohol Use: Yes     Comment: once a month     Lives - At home, fairly mobile      Family History :     Family History  Problem Relation Age of Onset  . CAD Mother   . Hypertension Mother   . Heart attack Mother   . CAD Father    . Heart attack Father   . Allergic rhinitis Father   . Asthma Father   . Hypertension Brother   . Hypertension Brother   . Pancreatic cancer Paternal Aunt   . Breast cancer Paternal Aunt        Home Medications:   Prior to Admission medications   Medication Sig Start Date End Date Taking? Authorizing Provider  albuterol (PROVENTIL HFA;VENTOLIN HFA) 108 (90 BASE) MCG/ACT inhaler Inhale 2 puffs into the lungs every 4 (four) hours as needed for wheezing or shortness of breath. 11/14/12  Yes Richarda Overlie, MD  albuterol (PROVENTIL) (2.5 MG/3ML) 0.083% nebulizer solution Inhale 2.5 mLs into the lungs every 4 (four) hours as needed for wheezing or shortness of breath.  09/24/15  Yes Historical Provider, MD  Alpha Lipoic Acid 200 MG CAPS Take 600 mg by mouth 3 (three) times daily.   Yes Historical Provider, MD  aspirin EC 81 MG tablet Take 81 mg by mouth daily.   Yes Historical Provider, MD  b complex vitamins tablet Take 1 tablet by mouth daily.   Yes Historical Provider, MD  beclomethasone (QVAR) 40 MCG/ACT inhaler ONE PUFF TWICE A DAY TO PREVENT COUGH OR WHEEZE. USE SPACER. 02/10/16  Yes Fletcher Anon, MD  betamethasone dipropionate (DIPROLENE) 0.05 % cream Apply 1 application topically daily as needed (eczema).   Yes Historical Provider, MD  BRILINTA 90 MG TABS tablet Take 90 mg by mouth 2 (two) times daily. 11/25/15  Yes Historical Provider, MD  cetirizine (ZYRTEC) 10 MG tablet Take 10 mg by mouth daily as needed for allergies.   Yes Historical Provider, MD  clotrimazole-betamethasone (LOTRISONE) cream Apply 1 application topically daily as needed (eczema).   Yes Historical  Provider, MD  diphenhydrAMINE (BENADRYL) 25 MG tablet Take 1 tablet (25 mg total) by mouth every 6 (six) hours as needed for itching or allergies. 04/05/13  Yes Fayrene Helper, PA-C  enalapril (VASOTEC) 10 MG tablet Take 10 mg by mouth 2 (two) times daily.   Yes Historical Provider, MD  EPINEPHrine (EPIPEN 2-PAK) 0.3 mg/0.3 mL IJ  SOAJ injection USE AS DIRECTED FOR SEVERE ALLERGIC REACTION. 02/10/16  Yes Fletcher Anon, MD  Estrogens, Conjugated (PREMARIN VA) Place 1 application vaginally 3 (three) times a week. On Saturdays, Mondays, and Wednesday (cream)   Yes Historical Provider, MD  fentaNYL (DURAGESIC - DOSED MCG/HR) 100 MCG/HR Place 1 patch onto the skin every 3 (three) days.     Yes Historical Provider, MD  fluticasone (FLONASE) 50 MCG/ACT nasal spray Place 2 sprays into the nose 2 (two) times daily.     Yes Historical Provider, MD  furosemide (LASIX) 20 MG tablet Take 20 mg by mouth 3 (three) times daily as needed for fluid.   Yes Historical Provider, MD  furosemide (LASIX) 40 MG tablet Take 40 mg by mouth daily as needed for fluid.    Yes Historical Provider, MD  gabapentin (NEURONTIN) 100 MG capsule Take 400-700 mg by mouth See admin instructions. Pt takes 400mg  in the morning, 400mg  at lunchtime and 700mg  in the evening   Yes Historical Provider, MD  GLUCOMANNAN PO Take 700 mg by mouth 2 (two) times daily.   Yes Historical Provider, MD  Guaifenesin (MUCINEX MAXIMUM STRENGTH) 1200 MG TB12 Take 1,200 mg by mouth 2 (two) times daily.   Yes Historical Provider, MD  hydroxychloroquine (PLAQUENIL) 200 MG tablet Take 200 mg by mouth 2 (two) times daily.   Yes Historical Provider, MD  Insulin Syringe-Needle U-100 (EASY TOUCH INSULIN SYRINGE) 30G X 5/16" 0.5 ML MISC  01/26/16  Yes Historical Provider, MD  ibuprofen (ADVIL,MOTRIN) 400 MG tablet Take 1 tablet (400 mg total) by mouth every 6 (six) hours as needed. Patient not taking: Reported on 03/09/2016 02/04/16   01/28/16, MD  insulin regular human CONCENTRATED (HUMULIN R) 500 UNIT/ML SOLN injection Inject 10-40 Units into the skin 3 (three) times daily with meals. Based on sliding scale    Historical Provider, MD  levalbuterol (XOPENEX) 1.25 MG/0.5ML nebulizer solution Take 1 ampule by nebulization 4 (four) times daily as needed for wheezing or shortness of breath.     Historical Provider, MD  levothyroxine (SYNTHROID, LEVOTHROID) 200 MCG tablet Take 200 mcg by mouth daily. Take with 2 25 mcg tablets for a 250 mcg dose    Historical Provider, MD  levothyroxine (SYNTHROID, LEVOTHROID) 25 MCG tablet Take 25 mcg by mouth daily. 10/20/15   Historical Provider, MD  magnesium oxide (MAG-OX) 400 MG tablet Take 400 mg by mouth 3 (three) times daily.    Historical Provider, MD  metFORMIN (GLUCOPHAGE-XR) 500 MG 24 hr tablet Take 1,000 mg by mouth 2 (two) times daily. 11/25/15   Historical Provider, MD  metoprolol tartrate (LOPRESSOR) 25 MG tablet Take 25 mg by mouth 2 (two) times daily. 11/25/15   Historical Provider, MD  milk thistle 175 MG tablet Take 1,000 mg by mouth 2 (two) times daily.    Historical Provider, MD  Multiple Vitamin (MULTIVITAMIN) tablet Take 1 tablet by mouth daily.    Historical Provider, MD  mupirocin cream (BACTROBAN) 2 % Apply 1 application topically daily as needed (eczema).    Historical Provider, MD  omeprazole (PRILOSEC) 20 MG capsule Take 20 mg by  mouth.    Historical Provider, MD  ondansetron (ZOFRAN) 4 MG tablet Take 1 tablet (4 mg total) by mouth every 8 (eight) hours as needed for nausea or vomiting. 02/23/16   Leta Baptist, MD  Oxycodone HCl 10 MG TABS Take 10 mg by mouth every 4 (four) hours as needed (for pain).    Historical Provider, MD  oxymetazoline (AFRIN) 0.05 % nasal spray Place 1 spray into both nostrils 2 (two) times daily as needed for congestion.    Historical Provider, MD  Polyethyl Glycol-Propyl Glycol (SYSTANE ULTRA OP) Apply 1 drop to eye daily as needed (dry eyes).    Historical Provider, MD  promethazine (PHENERGAN) 25 MG tablet Take 25 mg by mouth every 6 (six) hours as needed for nausea or vomiting.    Historical Provider, MD  quiNINE (QUALAQUIN) 324 MG capsule Take 648 mg by mouth daily as needed (inflammation).    Historical Provider, MD  saccharomyces boulardii (FLORASTOR) 250 MG capsule Take 1 capsule (250 mg total)  by mouth 2 (two) times daily. 02/13/15   Osvaldo Shipper, MD  sulfamethoxazole-trimethoprim (BACTRIM DS,SEPTRA DS) 800-160 MG tablet Take 1 tablet by mouth 2 (two) times daily. Reported on 03/09/2016 02/23/16   Historical Provider, MD  vitamin A 14970 UNIT capsule Take 10,000 Units by mouth daily.    Historical Provider, MD  vitamin B-12 (CYANOCOBALAMIN) 1000 MCG tablet Take 1,000 mcg by mouth daily.      Historical Provider, MD  Vitamin D, Ergocalciferol, (DRISDOL) 50000 units CAPS capsule Take 1 capsule by mouth once a week. 11/25/15   Historical Provider, MD  Vitamins-Lipotropics (LIPO-FLAVONOID PLUS PO) Take 1 tablet by mouth 3 (three) times daily.    Historical Provider, MD     Allergies:     Allergies  Allergen Reactions  . Fish Allergy Anaphylaxis  . Fish Oil [Omega-3 Fatty Acids] Swelling    Throat swelling   . Gabapentin Other (See Comments)    Anxiety on high doses  . Ipratropium Shortness Of Breath  . Ipratropium-Albuterol Other (See Comments) and Shortness Of Breath    Wheezing becomes worse, can take albuterol with ipratropium  . Peanut-Containing Drug Products Shortness Of Breath  . Red Yeast Rice [Cholestin] Other (See Comments)    Muscle pain and severe joint pain.   . Atrovent Other (See Comments)    Wheezing becomes worse  . Crestor [Rosuvastatin] Other (See Comments)    Extreme joint pain Extreme joint pain, to this & other statins  . Suprep [Na Sulfate-K Sulfate-Mg Sulf] Nausea And Vomiting  . Tape     Electrodes causes skin breakdown  . Penicillins Rash and Other (See Comments)    Unknown childhood reaction.  Has patient had a PCN reaction causing immediate rash, facial/tongue/throat swelling, SOB or lightheadedness with hypotension: Unknown Has patient had a PCN reaction causing severe rash involving mucus membranes or skin necrosis: Unknown Has patient had a PCN reaction that required hospitalization Unknown Has patient had a PCN reaction occurring within the  last 10 years: Unknown If all of the above answers are "NO", then may proceed with Cephalosporin use.       Physical Exam:   Vitals  Blood pressure 129/76, pulse 91, temperature 98.4 F (36.9 C), temperature source Oral, resp. rate 19, height 5\' 2"  (1.575 m), weight 122.1 kg (269 lb 2.9 oz), SpO2 99 %.   1. General Morbidly obese white female lying in bed in NAD,     2. Normal affect and insight, Not  Suicidal or Homicidal, Awake Alert, Oriented X 3.  3. No F.N deficits, ALL C.Nerves Intact, Strength 5/5 all 4 extremities, Sensation intact all 4 extremities, Plantars down going.  4. Ears and Eyes appear Normal, Conjunctivae clear, PERRLA. Moist Oral Mucosa.  5. Supple Neck, No JVD, No cervical lymphadenopathy appriciated, No Carotid Bruits.  6. Symmetrical Chest wall movement, Good air movement bilaterally, CTAB.  7. RRR, No Gallops, Rubs or Murmurs, No Parasternal Heave.  8. Positive Bowel Sounds, Abdomen Soft, No tenderness, No organomegaly appriciated,No rebound -guarding or rigidity.  9.  No Cyanosis, Normal Skin Turgor, No Skin Rash or Bruise.  10. Good muscle tone,  joints appear normal , no effusions, Normal ROM. Right leg has cellulitis up to 2 cm below the knee, that is a small half a centimeter circular ulcer on the plantar aspect of her right heel, no surrounding cellulitis or any drainage.  11. No Palpable Lymph Nodes in Neck or Axillae      Data Review:    CBC  Recent Labs Lab 03/09/16 1405  WBC 14.1*  HGB 13.4  HCT 41.8  PLT 213  MCV 88.7  MCH 28.5  MCHC 32.1  RDW 14.9  LYMPHSABS 1.0  MONOABS 0.8  EOSABS 0.0  BASOSABS 0.0   ------------------------------------------------------------------------------------------------------------------  Chemistries   Recent Labs Lab 03/09/16 1320  NA 134*  K 3.7  CL 97*  CO2 27  GLUCOSE 214*  BUN 9  CREATININE 0.81  CALCIUM 9.0  AST 27  ALT 27  ALKPHOS 48  BILITOT 0.8    ------------------------------------------------------------------------------------------------------------------ estimated creatinine clearance is 98.9 mL/min (by C-G formula based on Cr of 0.81). ------------------------------------------------------------------------------------------------------------------ No results for input(s): TSH, T4TOTAL, T3FREE, THYROIDAB in the last 72 hours.  Invalid input(s): FREET3  Coagulation profile No results for input(s): INR, PROTIME in the last 168 hours. ------------------------------------------------------------------------------------------------------------------- No results for input(s): DDIMER in the last 72 hours. -------------------------------------------------------------------------------------------------------------------  Cardiac Enzymes No results for input(s): CKMB, TROPONINI, MYOGLOBIN in the last 168 hours.  Invalid input(s): CK ------------------------------------------------------------------------------------------------------------------    Component Value Date/Time   BNP 62.6 02/11/2015 0310     ---------------------------------------------------------------------------------------------------------------  Urinalysis    Component Value Date/Time   COLORURINE AMBER* 03/09/2016 1415   APPEARANCEUR CLEAR 03/09/2016 1415   LABSPEC 1.017 03/09/2016 1415   PHURINE 6.0 03/09/2016 1415   GLUCOSEU NEGATIVE 03/09/2016 1415   HGBUR NEGATIVE 03/09/2016 1415   BILIRUBINUR NEGATIVE 03/09/2016 1415   KETONESUR NEGATIVE 03/09/2016 1415   PROTEINUR NEGATIVE 03/09/2016 1415   UROBILINOGEN 0.2 02/10/2015 1835   NITRITE NEGATIVE 03/09/2016 1415   LEUKOCYTESUR NEGATIVE 03/09/2016 1415    ----------------------------------------------------------------------------------------------------------------   Imaging Results:    No results found.     Assessment & Plan:      1. Lower extremity cellulitis failed Bactrim home a  small right heel ulcer. Will be admitted to the hospital, blood cultures have been drawn in the ER, IV vancomycin and meropenem due to penicillin allergy, does not appear toxic, lactic acid normal. Gentle IV fluids, wound care consult for right heel ulcer there after outpatient follow-up with orthopedics Dr. Lajoyce Corners. This ulcer does not appear to be deep or infected, it has been present for about 1-2 weeks.  2. CAD. It is post drug-eluting stent placement in January 2017. Chest pain-free, get baseline EKG, continue aspirin, Bralinta, beta blocker for secondary prevention.  3. Hypothyroidism. Continue home dose Synthroid check TSH.  4. Chronic back pain. On fentanyl continue, supportive care.  5. Nausea vomiting. Currently resolved was due  to right lower extremity cellulitis. Monitor.  6. Mixed connective tissue disorder. Continue home regimen. She is on Plaquanil outpatient ophthalmology follow-up to be scheduled by PCP.  7. Fluid retention. Recent echo from 2017 reviewed, EF of 60% and no LVH. Continue home dose Lasix from tomorrow.  8. Asthma. No wheezing. Supportive care to continue with nebulizer treatments and oxygen as needed.  9. Morbid obesity with recent diagnosis of obstructive sleep apnea. She still not on CPAP, follow with PCP for weight reduction, outpatient follow-up with PCP and Dr. Carolanne Grumbling for sleep apnea and possible CPAP machine.  10. DM type II. Check A1c, continue home regimen and will add sliding scale.    DVT Prophylaxis Lovenox   AM Labs Ordered, also please review Full Orders  Family Communication: Admission, patients condition and plan of care including tests being ordered have been discussed with the patient who indicates understanding and agree with the plan and Code Status.  Code Status Full  Likely DC to Home in  2 - 3 days  Condition fair  Consults called: None    Admission status: Inpt  Time spent in minutes :35   Susa Raring K M.D on  03/09/2016 at 6:39 PM  Between 7am to 7pm - Pager - [phone]. After 7pm go to [email or web address] - password [facility]  [facility] - Office  [phone]

## 2016-03-09 NOTE — ED Notes (Signed)
[facility] here for transport. 

## 2016-03-09 NOTE — ED Notes (Signed)
Vomiting all morning   Took a phenergan  At home  Still nauseated.  "her usual"  Rt lower leg red hot. Also back pain

## 2016-03-10 DIAGNOSIS — E039 Hypothyroidism, unspecified: Secondary | ICD-10-CM

## 2016-03-10 DIAGNOSIS — M358 Other specified systemic involvement of connective tissue: Secondary | ICD-10-CM

## 2016-03-10 DIAGNOSIS — L03115 Cellulitis of right lower limb: Secondary | ICD-10-CM

## 2016-03-10 LAB — BASIC METABOLIC PANEL
Anion gap: 6 (ref 5–15)
BUN: 9 mg/dL (ref 6–20)
CO2: 27 mmol/L (ref 22–32)
Calcium: 8.7 mg/dL — ABNORMAL LOW (ref 8.9–10.3)
Chloride: 104 mmol/L (ref 101–111)
Creatinine, Ser: 0.68 mg/dL (ref 0.44–1.00)
GFR calc Af Amer: >60 mL/min (ref >60)
GFR calc non Af Amer: >60 mL/min (ref >60)
Glucose, Bld: 224 mg/dL — ABNORMAL HIGH (ref 65–99)
Potassium: 3.4 mmol/L — ABNORMAL LOW (ref 3.5–5.1)
Sodium: 137 mmol/L (ref 135–145)

## 2016-03-10 LAB — CBC
HCT: 37.3 % (ref 36.0–46.0)
Hemoglobin: 12.1 g/dL (ref 12.0–15.0)
MCH: 28.5 pg (ref 26.0–34.0)
MCHC: 32.4 g/dL (ref 30.0–36.0)
MCV: 88 fL (ref 78.0–100.0)
Platelets: 201 10*3/uL (ref 150–400)
RBC: 4.24 MIL/uL (ref 3.87–5.11)
RDW: 14.8 % (ref 11.5–15.5)
WBC: 8.1 10*3/uL (ref 4.0–10.5)

## 2016-03-10 LAB — GLUCOSE, CAPILLARY
Glucose-Capillary: 191 mg/dL — ABNORMAL HIGH (ref 65–99)
Glucose-Capillary: 171 mg/dL — ABNORMAL HIGH (ref 65–99)
Glucose-Capillary: 61 mg/dL — ABNORMAL LOW (ref 65–99)
Glucose-Capillary: 88 mg/dL (ref 65–99)
Glucose-Capillary: 181 mg/dL — ABNORMAL HIGH (ref 65–99)
Glucose-Capillary: 175 mg/dL — ABNORMAL HIGH (ref 65–99)

## 2016-03-10 LAB — HEMOGLOBIN A1C
Hgb A1c MFr Bld: 7.7 % — ABNORMAL HIGH (ref 4.8–5.6)
Mean Plasma Glucose: 174 mg/dL

## 2016-03-10 MED ORDER — GABAPENTIN 400 MG PO CAPS
700.0000 mg | ORAL_CAPSULE | Freq: Once | ORAL | Status: AC
  Administered 2016-03-10: 700 mg via ORAL
  Filled 2016-03-10: qty 1

## 2016-03-10 MED ORDER — GABAPENTIN 400 MG PO CAPS
400.0000 mg | ORAL_CAPSULE | Freq: Two times a day (BID) | ORAL | Status: DC
  Administered 2016-03-10 – 2016-03-11 (×2): 400 mg via ORAL
  Filled 2016-03-10 (×2): qty 1

## 2016-03-10 MED ORDER — ALBUTEROL SULFATE (2.5 MG/3ML) 0.083% IN NEBU: 2.5000 mg | INHALATION_SOLUTION | RESPIRATORY_TRACT | Status: DC | PRN

## 2016-03-10 MED ORDER — GABAPENTIN 400 MG PO CAPS
700.0000 mg | ORAL_CAPSULE | Freq: Every day | ORAL | Status: DC
  Administered 2016-03-10: 700 mg via ORAL
  Filled 2016-03-10: qty 1

## 2016-03-10 MED ORDER — ALBUTEROL SULFATE (2.5 MG/3ML) 0.083% IN NEBU
2.5000 mg | INHALATION_SOLUTION | Freq: Three times a day (TID) | RESPIRATORY_TRACT | Status: DC
  Administered 2016-03-11: 2.5 mg via RESPIRATORY_TRACT
  Filled 2016-03-10 (×2): qty 3

## 2016-03-10 MED ORDER — ALBUTEROL SULFATE (2.5 MG/3ML) 0.083% IN NEBU
2.5000 mg | INHALATION_SOLUTION | RESPIRATORY_TRACT | Status: DC | PRN
  Administered 2016-03-10 (×2): 2.5 mg via RESPIRATORY_TRACT
  Filled 2016-03-10: qty 3

## 2016-03-10 MED ORDER — ENOXAPARIN SODIUM 60 MG/0.6ML ~~LOC~~ SOLN
60.0000 mg | SUBCUTANEOUS | Status: DC
  Administered 2016-03-10: 60 mg via SUBCUTANEOUS
  Filled 2016-03-10: qty 0.6

## 2016-03-10 MED ORDER — VANCOMYCIN HCL 10 G IV SOLR
1250.0000 mg | Freq: Two times a day (BID) | INTRAVENOUS | Status: DC
  Administered 2016-03-10 – 2016-03-11 (×3): 1250 mg via INTRAVENOUS
  Filled 2016-03-10 (×3): qty 1250

## 2016-03-10 NOTE — Progress Notes (Signed)
Pharmacy: Re- vancomycin  Patient is a 55 y.o F currently on vancomycin, ceftriaxone and flagyl day #2 for LE cellulitis/ulcer.  Updated weight 125 kg, scr stable with crcl >100 N, bcx pending.  Plan: - Adjust vancomycin dose to 1250 mg IV q12h - will plan on checking level at steady state if to continue with abx  - will adjust lovenox dose to 60 mg SQ q24h (~0.5 mg/kg/day-- adjusted dose for BMI>30) for VTE prophylaxis  Dorna Leitz, PharmD, BCPS 03/10/2016 8:26 AM

## 2016-03-10 NOTE — Consult Note (Signed)
WOC wound consult note Reason for Consult: right heel ulcer, full thickness (healing) and right great toe ulcer (healing),. Patient states that these areas are resultant from blisters forming after wearing new tennis-style shoes. Wound type:traumatic Pressure Ulcer POA: No Measurement: right heel:  0.8cn x 0.4cm x 0.2cm and right toe drying blood blister (nearly healed):  1cm x 0.4cm with no depth. Wound DXA:JOINO heel ulcer with pink wound bed, dry.  Right great toe: nearly resolved blood blister. Drainage (amount, consistency, odor) None Periwound:Intact, dry Dressing procedure/placement/frequency: I have recommended that patient wash her feet with an antibacterial (antimicrobial) soap such as Dial or Safeguard and that for now, that she cease the practice of soaking her feet. Patient indicates understanding. I will implement once daily application of a betadine swabstick  which will be allowed to air-dry prior to the application of a dry gauze dressing. Patient indicates she is leaving her podiatry office in [facility] and will seek a relationship with a podiatry office in [place] for follow-up and for consideration of orthotics for her protective shoe wear. WOC nursing team will not follow, but will remain available to this patient, the nursing and medical teams.  Please re-consult if needed. Thanks, Ladona Mow, MSN, RN, GNP, [facility]  Pager# [phone]

## 2016-03-10 NOTE — Progress Notes (Signed)
PROGRESS NOTE    [name]  AVW:[id number] DOB: [birth year] DOA: 03/09/2016  PCP: Cheral Bay, MD   Brief Narrative:  [name] is a 55 y.o. female, With morbid obesity, recent diagnosis of sleep apnea not on CPAP yet, essential hypertension, CAD status post stent placement, asthma, essential hypertension, DM type II, GERD, hypothyroidism, mixed connective tissue disorder, back pain.  She recently underwent treatment of right leg cellulitis with bactrim and returns for recurrence of cellulitis. She also has an ulcer on her right heel which started as a blister and has gotten bigger due to new shoes. Presenting complaints were fever, nausea, vomiting and redness and swelling of leg.   Subjective: Feels much better. Redness in leg has improved. No nausea or pain.   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 - Plaquenil  DM2 - insulin sliding scale in addition to home insulin Regular 500  HTN - Metoprolol, Vasotec, Lasix  CAD s/p stent Brillinta  Diabetic neuropathy - Neurontin   DVT prophylaxis: Lovenox Code Status: full code Family Communication:  Disposition Plan: home in 2-3 days Consultants:   none Procedures:   none Antimicrobials:  Anti-infectives    Start     Dose/Rate Route Frequency Ordered Stop   03/10/16 1430  vancomycin (VANCOCIN) 1,250 mg in sodium chloride 0.9 % 250 mL IVPB     1,250 mg 166.7 mL/hr over 90 Minutes Intravenous Every 12  hours 03/10/16 0827     03/10/16 0230  vancomycin (VANCOCIN) IVPB 1000 mg/200 mL premix  Status:  Discontinued     1,000 mg 200 mL/hr over 60 Minutes Intravenous Every 12 hours 03/09/16 1400 03/10/16 0827   03/09/16 2200  hydroxychloroquine (PLAQUENIL) tablet 200 mg     200 mg Oral 2 times daily 03/09/16 1851     03/09/16 2000  cefTRIAXone (ROCEPHIN) 2 g in dextrose 5 % 50 mL IVPB     2 g 100 mL/hr over 30 Minutes Intravenous Every 24 hours 03/09/16 1915     03/09/16 2000  metroNIDAZOLE (FLAGYL) IVPB 500 mg     500 mg 100 mL/hr over 60 Minutes Intravenous Every 8 hours 03/09/16 1915     03/09/16 1400  vancomycin (VANCOCIN) IVPB 1000 mg/200 mL premix     1,000 mg 200 mL/hr over 60 Minutes Intravenous  Once 03/09/16 1347 03/09/16 1738   03/09/16 1400  vancomycin (VANCOCIN) IVPB 1000 mg/200 mL premix     1,000 mg 200 mL/hr over 60 Minutes Intravenous  Once 03/09/16 1347 03/09/16 1638   03/09/16 1345  ciprofloxacin (CIPRO) IVPB 400 mg     400 mg 200 mL/hr over 60 Minutes Intravenous  Once 03/09/16 1342 03/09/16 1532       Objective: Filed Vitals:   03/10/16 0250 03/10/16 0422 03/10/16 0602 03/10/16 1730  BP:   116/98 120/69  Pulse:   69 74  Temp:   97.7 F (36.5 C) 98.1 F (36.7 C)  TempSrc:   Oral Oral  Resp:   20 20  Height:      Weight:  124.9 kg (275 lb 5.7 oz)    SpO2: 95%  96% 98%    Intake/Output Summary (Last 24 hours) at 03/10/16 1749 Last data filed at 03/10/16 1427  Gross per 24 hour  Intake 1257.5 ml  Output      2 ml  Net 1255.5 ml   Filed Weights   03/09/16 1232 03/09/16 1803 03/10/16 0422  Weight: 122.471 kg (270 lb) 122.1 kg (269 lb 2.9 oz) 124.9 kg (275 lb 5.7 oz)    Examination: General exam: Appears comfortable  HEENT: PERRLA, oral mucosa moist, no sclera icterus or thrush Respiratory system: Clear to auscultation. Respiratory effort normal. Cardiovascular system: S1 & S2 heard, RRR.  No murmurs  Gastrointestinal system: Abdomen soft,  non-tender, nondistended. Normal bowel sound. No organomegaly Central nervous system: Alert and oriented. No focal neurological deficits. Extremities: No cyanosis, clubbing - mild edema of right leg Skin: erythema of right leg, ulcer on right heal - clean based, no surrounding cellulitis- no drainage- blue purple discoloration of right toe Psychiatry:  Mood & affect appropriate.     Data Reviewed: I have personally reviewed following labs and imaging studies  CBC:  Recent Labs Lab 03/09/16 1405 03/09/16 1919 03/10/16 0545  WBC 14.1* 11.5* 8.1  NEUTROABS 12.4*  --   --   HGB 13.4 13.5 12.1  HCT 41.8 40.1 37.3  MCV 88.7 86.2 88.0  PLT 213 204 201   Basic Metabolic Panel:  Recent Labs Lab 03/09/16 1320 03/09/16 1919 03/10/16 0545  NA 134*  --  137  K 3.7  --  3.4*  CL 97*  --  104  CO2 27  --  27  GLUCOSE 214*  --  224*  BUN 9  --  9  CREATININE 0.81 0.83 0.68  CALCIUM 9.0  --  8.7*   GFR: Estimated Creatinine Clearance: 101.5 mL/min (by C-G formula based on Cr of 0.68). Liver Function Tests:  Recent Labs Lab 03/09/16 1320  AST 27  ALT 27  ALKPHOS 48  BILITOT 0.8  PROT 6.4*  ALBUMIN 3.4*    Recent Labs Lab 03/09/16 1320  LIPASE 13   No results for input(s): AMMONIA in the last 168 hours. Coagulation Profile:  Recent Labs Lab 03/09/16 1919  INR 1.21   Cardiac Enzymes: No results for input(s): CKTOTAL, CKMB, CKMBINDEX, TROPONINI in the last 168 hours. BNP (last 3 results) No results for input(s): PROBNP in the last 8760 hours. HbA1C:  Recent Labs  03/09/16 1919  HGBA1C 7.7*   CBG:  Recent Labs Lab 03/09/16 2109 03/10/16 0741 03/10/16 1150  GLUCAP 213* 191* 171*   Lipid Profile: No results for input(s): CHOL, HDL, LDLCALC, TRIG, CHOLHDL, LDLDIRECT in the last 72 hours. Thyroid Function Tests:  Recent Labs  03/09/16 1919  TSH 0.028*   Anemia Panel: No results for input(s): VITAMINB12, FOLATE, FERRITIN, TIBC, IRON, RETICCTPCT  in the last 72 hours. Urine analysis:    Component Value Date/Time   COLORURINE AMBER* 03/09/2016 1415   APPEARANCEUR CLEAR 03/09/2016 1415   LABSPEC 1.017 03/09/2016 1415   PHURINE 6.0 03/09/2016 1415   GLUCOSEU NEGATIVE 03/09/2016 1415   HGBUR NEGATIVE 03/09/2016 1415   BILIRUBINUR NEGATIVE 03/09/2016 1415   KETONESUR NEGATIVE 03/09/2016 1415   PROTEINUR NEGATIVE 03/09/2016 1415   UROBILINOGEN 0.2 02/10/2015 1835   NITRITE NEGATIVE 03/09/2016 1415   LEUKOCYTESUR NEGATIVE 03/09/2016 1415   Sepsis Labs: @LABRCNTIP (procalcitonin:4,lacticidven:4) ) Recent  Results (from the past 240 hour(s))  Culture, blood (Routine X 2) w Reflex to ID Panel     Status: None (Preliminary result)   Collection Time: 03/09/16  2:05 PM  Result Value Ref Range Status   Specimen Description BLOOD LEFT ANTECUBITAL  Final   Special Requests BOTTLES DRAWN AEROBIC AND ANAEROBIC 5CC EACH  Final   Culture   Final    NO GROWTH < 24 HOURS Performed at [facility]    Report Status PENDING  Incomplete  Culture, blood (Routine X 2) w Reflex to ID Panel     Status: None (Preliminary result)   Collection Time: 03/09/16  2:05 PM  Result Value Ref Range Status   Specimen Description BLOOD RIGHT ANTECUBITAL  Final   Special Requests BOTTLES DRAWN AEROBIC AND ANAEROBIC 5CC EACH  Final   Culture   Final    NO GROWTH < 24 HOURS Performed at [facility]    Report Status PENDING  Incomplete         Radiology Studies: No results found.    Scheduled Meds: . aspirin EC  81 mg Oral Daily  . budesonide (PULMICORT) nebulizer solution  0.25 mg Nebulization BID  . cefTRIAXone (ROCEPHIN)  IV  2 g Intravenous Q24H  . enalapril  10 mg Oral BID  . enoxaparin (LOVENOX) injection  60 mg Subcutaneous Q24H  . fentaNYL  100 mcg Transdermal Q72H  . furosemide  20 mg Oral Daily  . gabapentin  400 mg Oral BID  . gabapentin  700 mg Oral QHS  . hydroxychloroquine  200 mg Oral BID  . insulin aspart  0-15  Units Subcutaneous TID WC  . insulin aspart  0-5 Units Subcutaneous QHS  . insulin regular human CONCENTRATED  50 Units Subcutaneous TID WC  . levothyroxine  200 mcg Oral Once per day on Mon Tue Wed Thu Fri  . [START ON 03/11/2016] levothyroxine  225 mcg Oral Once per day on Sun Sat  . loratadine  10 mg Oral Daily  . magnesium oxide  400 mg Oral QPM  . metoprolol tartrate  25 mg Oral BID  . metronidazole  500 mg Intravenous Q8H  . saccharomyces boulardii  250 mg Oral BID  . ticagrelor  90 mg Oral BID  . vancomycin  1,250 mg Intravenous Q12H   Continuous Infusions: . sodium chloride 75 mL/hr at 03/10/16 1427     LOS: 1 day    Time spent in minutes: 35    Reather Steller, MD [facility] Pager: [email or web address] Password [facility] 03/10/2016, 5:49 PM

## 2016-03-10 NOTE — Progress Notes (Signed)
1700 Humulin R insulin moved to 2100. Patient CBG before dinner was 61, patient was given 4 oz of orange juice. CBG came up to 88. Patient requested that sugar be checked around 8 or 9 pm, to make sure it is slightly higher before she takes the concentrated insulin.     Hypoglycemic Event  CBG: 61 time: 1800  Treatment: 15 GM carbohydrate snack  Symptoms: Shaky and Hungry  Follow-up CBG: Time:1825 CBG Result:88  Possible Reasons for Event: Unknown  Comments/MD notified:See above comments    Pincus Badder

## 2016-03-11 DIAGNOSIS — L97411 Non-pressure chronic ulcer of right heel and midfoot limited to breakdown of skin: Secondary | ICD-10-CM

## 2016-03-11 DIAGNOSIS — R894 Abnormal immunological findings in specimens from other organs, systems and tissues: Secondary | ICD-10-CM

## 2016-03-11 LAB — GLUCOSE, CAPILLARY
Glucose-Capillary: 119 mg/dL — ABNORMAL HIGH (ref 65–99)
Glucose-Capillary: 205 mg/dL — ABNORMAL HIGH (ref 65–99)

## 2016-03-11 MED ORDER — METRONIDAZOLE 500 MG PO TABS: 500.0000 mg | ORAL_TABLET | Freq: Three times a day (TID) | ORAL | Status: DC

## 2016-03-11 MED ORDER — DOXYCYCLINE HYCLATE 100 MG PO TBEC: 100.0000 mg | DELAYED_RELEASE_TABLET | Freq: Two times a day (BID) | ORAL | Status: DC

## 2016-03-11 MED ORDER — DOXYCYCLINE MONOHYDRATE 100 MG PO TABS: 100.0000 mg | ORAL_TABLET | Freq: Two times a day (BID) | ORAL | Status: DC

## 2016-03-11 NOTE — Discharge Summary (Signed)
Physician Discharge Summary  [name] XBJ:[id number] DOB: 1961-08-06 DOA: 03/09/2016  PCP: Cheral Bay, MD  Admit date: 03/09/2016 Discharge date: 03/11/2016  Admitted From: home  Disposition:  home   Recommendations for Outpatient Follow-up:  1. Re-evals for cellulitis and ulcer before the end of the week  Home Health:  none  Equipment/Devices:  none    Discharge Condition:  stable   CODE STATUS:  Full code   Diet recommendation:  Diabetic, low sodium, heart healthy Consultations:  none    Discharge Diagnoses:  Principal Problem:   Cellulitis of leg, right Active Problems:   Essential hypertension, benign   Nausea vomiting and diarrhea   Acquired hypothyroidism   Anti-RNP antibodies present   Acid reflux   Fibromyalgia   Adult hypothyroidism   Mixed collagen vascular disease (HCC)   Type 2 diabetes mellitus (HCC)   CHF (congestive heart failure) (HCC)   Lumbar radiculopathy   Severe persistent asthma   Coronary artery disease involving native coronary artery of native heart without angina pectoris   Cellulitis of right leg    Subjective: Erythema and swelling in right leg much better. Ulcer on heel bleeds when ever she walks to the bathroom. It had been doing this at home.   Brief Summary: [name] is a 55 y.o. female, With morbid obesity, recent diagnosis of sleep apnea not on CPAP yet, essential hypertension, CAD status post stent placement, asthma, essential hypertension, DM type II, GERD, hypothyroidism, mixed connective tissue disorder, back pain.  She recently underwent treatment of right leg cellulitis with bactrim and returns for recurrence of cellulitis. She also has an ulcer on her right heel which started as a blister and has gotten bigger due to new shoes. Presenting complaints were fever, nausea, vomiting and redness and swelling of leg.   Hospital Course:  Principal Problem:  Cellulitis of right leg in immune compromised patient  -  second occurrence in same leg -I do not feel she failed Bactrim- per patient, initial episode resolved completely with Bactrim- last dose was on 6/22 -  Cultures negative - cellulitis improved on Vanc, Rocephin and Flagyl - allergic to PCN- switch to Doxy and oral Flagyl x 1 wk- re-evaluate later this week   - has h/o C diff colitis- Florastor  Active Problems: Ulcer right heel - not infected and not the source of her cellulitis- Wound care>> Betadine and dressing daily - explained again today to off load heal- heel should not touch the ground at all- she has a boot at home that she received for plantar fascitis which will help with this- keep wound clean to prevent infection- explained this in length - close f/u with Dr Lajoyce Corners   Nausea vomiting  - likely due to sepsis- improved- tolerating diet   Acquired hypothyroidism - synthroid   Anti-RNP antibodies present/ mixed connective tissue disorder - Plaquenil  DM2 - insulin sliding scale in addition to home insulin Regular 500  HTN - Metoprolol, Vasotec, Lasix  CAD s/p stent Brillinta  Diabetic neuropathy - Neurontin  Discharge Instructions  Discharge Instructions    Discharge instructions    Complete by:  As directed   Diabetic low sodium, heart healthy Please keep pressure off of your left heel Clear wound with soap and water use Betadine on wound, allow it to dry prior to covering with gauze.     Increase activity slowly    Complete by:  As directed  Medication List    STOP taking these medications        sulfamethoxazole-trimethoprim 800-160 MG tablet  Commonly known as:  BACTRIM DS,SEPTRA DS      TAKE these medications        albuterol 108 (90 Base) MCG/ACT inhaler  Commonly known as:  PROVENTIL HFA;VENTOLIN HFA  Inhale 2 puffs into the lungs every 4 (four) hours as needed for wheezing or shortness of breath.     albuterol (2.5 MG/3ML) 0.083% nebulizer solution  Commonly known as:   PROVENTIL  Inhale 2.5 mLs into the lungs every 4 (four) hours as needed for wheezing or shortness of breath.     Alpha Lipoic Acid 200 MG Caps  Take 600 mg by mouth 3 (three) times daily.     aspirin EC 81 MG tablet  Take 81 mg by mouth daily.     b complex vitamins tablet  Take 1 tablet by mouth daily.     beclomethasone 40 MCG/ACT inhaler  Commonly known as:  QVAR  ONE PUFF TWICE A DAY TO PREVENT COUGH OR WHEEZE. USE SPACER.     betamethasone dipropionate 0.05 % cream  Commonly known as:  DIPROLENE  Apply 1 application topically daily as needed (eczema).     BRILINTA 90 MG Tabs tablet  Generic drug:  ticagrelor  Take 90 mg by mouth 2 (two) times daily.     cetirizine 10 MG tablet  Commonly known as:  ZYRTEC  Take 10 mg by mouth daily as needed for allergies.     clotrimazole-betamethasone cream  Commonly known as:  LOTRISONE  Apply 1 application topically daily as needed (eczema).     diphenhydrAMINE 25 MG tablet  Commonly known as:  BENADRYL  Take 1 tablet (25 mg total) by mouth every 6 (six) hours as needed for itching or allergies.     doxycycline 100 MG EC tablet  Commonly known as:  DORYX  Take 1 tablet (100 mg total) by mouth 2 (two) times daily.     EASY TOUCH INSULIN SYRINGE 30G X 5/16" 0.5 ML Misc  Generic drug:  Insulin Syringe-Needle U-100     enalapril 10 MG tablet  Commonly known as:  VASOTEC  Take 10 mg by mouth 2 (two) times daily.     EPINEPHrine 0.3 mg/0.3 mL Soaj injection  Commonly known as:  EPIPEN 2-PAK  USE AS DIRECTED FOR SEVERE ALLERGIC REACTION.     fentaNYL 100 MCG/HR  Commonly known as:  DURAGESIC - dosed mcg/hr  Place 1 patch onto the skin every 3 (three) days.     fluticasone 50 MCG/ACT nasal spray  Commonly known as:  FLONASE  Place 2 sprays into the nose 2 (two) times daily.     furosemide 20 MG tablet  Commonly known as:  LASIX  Take 20 mg by mouth 3 (three) times daily as needed for fluid.     furosemide 40 MG tablet   Commonly known as:  LASIX  Take 40 mg by mouth daily as needed for fluid.     gabapentin 100 MG capsule  Commonly known as:  NEURONTIN  Take 400-700 mg by mouth See admin instructions. Pt takes 400mg  in the morning, 400mg  at lunchtime and 700mg  in the evening     GLUCOMANNAN PO  Take 700 mg by mouth 2 (two) times daily.     HUMULIN R 500 UNIT/ML injection  Generic drug:  insulin regular human CONCENTRATED  Inject 0-100 Units into the skin 4 (  four) times daily -  before meals and at bedtime. TID w/ meals Cbgs <125 = no insulin          125-150 = 10 units using insulin syringe ( actual dose is 50 units)          151 - 200 = 15 units using insulin syringe ( actual dose is 75 units)          > 201 = 20 units using insulin syringe ( actual dose is 100 units)  QHS Cbgs <150 = no insulin          151-200= 5 units using insulin syringe ( actual dose is 25 units)          201 - 300 = 10 units using insulin syringe ( actual dose is 50 units)          > 300 = 15 units using insulin syringe ( actual dose is 75 units)     hydroxychloroquine 200 MG tablet  Commonly known as:  PLAQUENIL  Take 200 mg by mouth 2 (two) times daily.     ibuprofen 400 MG tablet  Commonly known as:  ADVIL,MOTRIN  Take 1 tablet (400 mg total) by mouth every 6 (six) hours as needed.     levalbuterol 1.25 MG/0.5ML nebulizer solution  Commonly known as:  XOPENEX  Take 1 ampule by nebulization 4 (four) times daily as needed for wheezing or shortness of breath.     levothyroxine 25 MCG tablet  Commonly known as:  SYNTHROID, LEVOTHROID  Take 25 mcg by mouth See admin instructions. Take 225 mcg once daily on Monday-Friday. Take 200 mcg once daily on Saturday and Sunday.     levothyroxine 200 MCG tablet  Commonly known as:  SYNTHROID, LEVOTHROID  Take 200 mcg by mouth See admin instructions. Take 225 mcg once daily on Monday-Friday. Take 200 mcg once daily on Saturday and Sunday.     LIPO-FLAVONOID PLUS PO  Take 1 tablet by  mouth 3 (three) times daily.     magnesium oxide 400 MG tablet  Commonly known as:  MAG-OX  Take 400 mg by mouth every evening.     metFORMIN 500 MG 24 hr tablet  Commonly known as:  GLUCOPHAGE-XR  Take 1,000 mg by mouth 2 (two) times daily.     metoprolol tartrate 25 MG tablet  Commonly known as:  LOPRESSOR  Take 25 mg by mouth 2 (two) times daily.     metroNIDAZOLE 500 MG tablet  Commonly known as:  FLAGYL  Take 1 tablet (500 mg total) by mouth 3 (three) times daily.     milk thistle 175 MG tablet  Take 175 mg by mouth 2 (two) times daily.     MUCINEX MAXIMUM STRENGTH 1200 MG Tb12  Generic drug:  Guaifenesin  Take 1,200 mg by mouth 2 (two) times daily.     multivitamin tablet  Take 1 tablet by mouth daily.     ondansetron 4 MG tablet  Commonly known as:  ZOFRAN  Take 1 tablet (4 mg total) by mouth every 8 (eight) hours as needed for nausea or vomiting.     Oxycodone HCl 10 MG Tabs  Take 10 mg by mouth every 4 (four) hours as needed (for pain).     oxymetazoline 0.05 % nasal spray  Commonly known as:  AFRIN  Place 1 spray into both nostrils 2 (two) times daily as needed for congestion.     PREMARIN VA  Place 1 application  vaginally 3 (three) times a week. On Saturdays, Mondays, and Wednesday (cream)     promethazine 25 MG tablet  Commonly known as:  PHENERGAN  Take 25 mg by mouth every 6 (six) hours as needed for nausea or vomiting.     saccharomyces boulardii 250 MG capsule  Commonly known as:  FLORASTOR  Take 1 capsule (250 mg total) by mouth 2 (two) times daily.     SYSTANE ULTRA OP  Apply 1 drop to eye daily as needed (dry eyes).     vitamin A 16109 UNIT capsule  Take 10,000 Units by mouth daily.     vitamin B-12 1000 MCG tablet  Commonly known as:  CYANOCOBALAMIN  Take 1,000 mcg by mouth daily.     Vitamin D (Ergocalciferol) 50000 units Caps capsule  Commonly known as:  DRISDOL  Take 1 capsule by mouth once a week. Monday.           Follow-up  Information    Follow up with Mir Vergie Living, MD.   Specialty:  Internal Medicine   Contact information:   [address] [place] [address] [place] [address] [phone]      Allergies  Allergen Reactions  . Fish Allergy Anaphylaxis  . Fish Oil [Omega-3 Fatty Acids] Swelling    Throat swelling   . Gabapentin Other (See Comments)    Anxiety on high doses  . Ipratropium Shortness Of Breath  . Ipratropium-Albuterol Other (See Comments) and Shortness Of Breath    Wheezing becomes worse, can take albuterol with ipratropium  . Peanut-Containing Drug Products Shortness Of Breath  . Red Yeast Rice [Cholestin] Other (See Comments)    Muscle pain and severe joint pain.   . Atrovent Other (See Comments)    Wheezing becomes worse  . Crestor [Rosuvastatin] Other (See Comments)    Extreme joint pain Extreme joint pain, to this & other statins  . Suprep [Na Sulfate-K Sulfate-Mg Sulf] Nausea And Vomiting  . Tape     Electrodes causes skin breakdown  . Penicillins Rash and Other (See Comments)    Unknown childhood reaction; Tolerates Keflex.   To undergo PCN challenge with allergist 02/2016     Procedures/Studies:   Dg Tibia/fibula Right  02/23/2016  CLINICAL DATA:  Vomiting.  Swollen leg. EXAM: RIGHT TIBIA AND FIBULA - 2 VIEW COMPARISON:  None. FINDINGS: There is no evidence of fracture or other focal bone lesions. Soft tissue swelling without visible radiopaque foreign body. IMPRESSION: Negative. Electronically Signed   By: Elsie Stain M.D.   On: 02/23/2016 09:34   Ct Head Wo Contrast  02/23/2016  CLINICAL DATA:  Headache.  History of migraines. EXAM: CT HEAD WITHOUT CONTRAST TECHNIQUE: Contiguous axial images were obtained from the base of the skull through the vertex without intravenous contrast. COMPARISON:  05/09/2011 brain MRI report, images not available for review. FINDINGS: Skull and Sinuses:Negative for fracture or destructive process. The visualized mastoids, middle ears,  and imaged paranasal sinuses are clear. Visualized orbits: Negative. Brain: Unremarkable. No evidence of acute infarction, hemorrhage, hydrocephalus, or mass lesion/mass effect. IMPRESSION: Negative exam.  No explanation for headache. Electronically Signed   By: Marnee Spring M.D.   On: 02/23/2016 12:16   Dg Hand Complete Right  02/23/2016  CLINICAL DATA:  RIGHT hand pain. Fell. Bruising. Incident occurred 2 weeks ago. EXAM: RIGHT HAND - COMPLETE 3+ VIEW COMPARISON:  02/04/2016 FINDINGS: There is no evidence of fracture or dislocation. There is no evidence of arthropathy or other focal bone  abnormality. Soft tissues are unremarkable. IMPRESSION: Negative with no change from priors. Electronically Signed   By: Elsie Stain M.D.   On: 02/23/2016 11:04        Discharge Exam: Filed Vitals:   03/10/16 2100 03/11/16 0604  BP: 124/54 110/61  Pulse: 82 71  Temp: 98.3 F (36.8 C) 97.9 F (36.6 C)  Resp: 20 20   Filed Vitals:   03/10/16 1730 03/10/16 1931 03/10/16 2100 03/11/16 0604  BP: 120/69  124/54 110/61  Pulse: 74  82 71  Temp: 98.1 F (36.7 C)  98.3 F (36.8 C) 97.9 F (36.6 C)  TempSrc: Oral  Oral Oral  Resp: Height:      Weight:      SpO2: 98% 93% 100% 100%    General: Pt is alert, awake, not in acute distress Cardiovascular: RRR, S1/S2 +, no rubs, no gallops Respiratory: CTA bilaterally, no wheezing, no rhonchi Abdominal: Soft, NT, ND, bowel sounds + Extremities: no edema, no cyanosis    The results of significant diagnostics from this hospitalization (including imaging, microbiology, ancillary and laboratory) are listed below for reference.     Microbiology: Recent Results (from the past 240 hour(s))  Culture, blood (Routine X 2) w Reflex to ID Panel     Status: None (Preliminary result)   Collection Time: 03/09/16  2:05 PM  Result Value Ref Range Status   Specimen Description BLOOD LEFT ANTECUBITAL  Final   Special Requests BOTTLES DRAWN AEROBIC AND  ANAEROBIC 5CC EACH  Final   Culture   Final    NO GROWTH < 24 HOURS Performed at [facility]    Report Status PENDING  Incomplete  Culture, blood (Routine X 2) w Reflex to ID Panel     Status: None (Preliminary result)   Collection Time: 03/09/16  2:05 PM  Result Value Ref Range Status   Specimen Description BLOOD RIGHT ANTECUBITAL  Final   Special Requests BOTTLES DRAWN AEROBIC AND ANAEROBIC 5CC EACH  Final   Culture   Final    NO GROWTH < 24 HOURS Performed at [facility]    Report Status PENDING  Incomplete     Labs: BNP (last 3 results) No results for input(s): BNP in the last 8760 hours. Basic Metabolic Panel:  Recent Labs Lab 03/09/16 1320 03/09/16 1919 03/10/16 0545  NA 134*  --  137  K 3.7  --  3.4*  CL 97*  --  104  CO2 27  --  27  GLUCOSE 214*  --  224*  BUN 9  --  9  CREATININE 0.81 0.83 0.68  CALCIUM 9.0  --  8.7*   Liver Function Tests:  Recent Labs Lab 03/09/16 1320  AST 27  ALT 27  ALKPHOS 48  BILITOT 0.8  PROT 6.4*  ALBUMIN 3.4*    Recent Labs Lab 03/09/16 1320  LIPASE 13   No results for input(s): AMMONIA in the last 168 hours. CBC:  Recent Labs Lab 03/09/16 1405 03/09/16 1919 03/10/16 0545  WBC 14.1* 11.5* 8.1  NEUTROABS 12.4*  --   --   HGB 13.4 13.5 12.1  HCT 41.8 40.1 37.3  MCV 88.7 86.2 88.0  PLT 213 204 201   Cardiac Enzymes: No results for input(s): CKTOTAL, CKMB, CKMBINDEX, TROPONINI in the last 168 hours. BNP: Invalid input(s): POCBNP CBG:  Recent Labs Lab 03/10/16 1800 03/10/16 1825 03/10/16 2104 03/10/16 2214 03/11/16 0732  GLUCAP 61* 88 181* 175* 119*  D-Dimer No results for input(s): DDIMER in the last 72 hours. Hgb A1c  Recent Labs  03/09/16 1919  HGBA1C 7.7*   Lipid Profile No results for input(s): CHOL, HDL, LDLCALC, TRIG, CHOLHDL, LDLDIRECT in the last 72 hours. Thyroid function studies  Recent Labs  03/09/16 1919  TSH 0.028*   Anemia work up No results for  input(s): VITAMINB12, FOLATE, FERRITIN, TIBC, IRON, RETICCTPCT in the last 72 hours. Urinalysis    Component Value Date/Time   COLORURINE AMBER* 03/09/2016 1415   APPEARANCEUR CLEAR 03/09/2016 1415   LABSPEC 1.017 03/09/2016 1415   PHURINE 6.0 03/09/2016 1415   GLUCOSEU NEGATIVE 03/09/2016 1415   HGBUR NEGATIVE 03/09/2016 1415   BILIRUBINUR NEGATIVE 03/09/2016 1415   KETONESUR NEGATIVE 03/09/2016 1415   PROTEINUR NEGATIVE 03/09/2016 1415   UROBILINOGEN 0.2 02/10/2015 1835   NITRITE NEGATIVE 03/09/2016 1415   LEUKOCYTESUR NEGATIVE 03/09/2016 1415   Sepsis Labs Invalid input(s): PROCALCITONIN,  WBC,  LACTICIDVEN Microbiology Recent Results (from the past 240 hour(s))  Culture, blood (Routine X 2) w Reflex to ID Panel     Status: None (Preliminary result)   Collection Time: 03/09/16  2:05 PM  Result Value Ref Range Status   Specimen Description BLOOD LEFT ANTECUBITAL  Final   Special Requests BOTTLES DRAWN AEROBIC AND ANAEROBIC 5CC EACH  Final   Culture   Final    NO GROWTH < 24 HOURS Performed at [facility]    Report Status PENDING  Incomplete  Culture, blood (Routine X 2) w Reflex to ID Panel     Status: None (Preliminary result)   Collection Time: 03/09/16  2:05 PM  Result Value Ref Range Status   Specimen Description BLOOD RIGHT ANTECUBITAL  Final   Special Requests BOTTLES DRAWN AEROBIC AND ANAEROBIC 5CC EACH  Final   Culture   Final    NO GROWTH < 24 HOURS Performed at [facility]    Report Status PENDING  Incomplete     Time coordinating discharge: Over 30 minutes  SIGNED:   Calvert Cantor, MD  [facility] 03/11/2016, 10:15 AM Pager   If 7PM-7AM, please contact night-coverage [email or web address] Password [email or web address]

## 2016-03-11 NOTE — Progress Notes (Signed)
Pt leaving at this time with her husband. Alert, oriented, and without c/o. Discharge instructions/prescriptions given/explained with pt verbalizing understanding. Pt aware to pickup prescriptions at pharmacy. Wound care supplies given. Followup appointments noted.

## 2016-03-14 LAB — CULTURE, BLOOD (ROUTINE X 2)
Culture: NO GROWTH
Culture: NO GROWTH

## 2016-03-28 ENCOUNTER — Ambulatory Visit (INDEPENDENT_AMBULATORY_CARE_PROVIDER_SITE_OTHER): Payer: BLUE CROSS/BLUE SHIELD | Admitting: Pediatrics

## 2016-03-28 ENCOUNTER — Encounter: Payer: Self-pay | Admitting: Pediatrics

## 2016-03-28 VITALS — BP 122/78 | HR 78 | Temp 98.2°F | Resp 18

## 2016-03-28 DIAGNOSIS — T7800XD Anaphylactic reaction due to unspecified food, subsequent encounter: Secondary | ICD-10-CM

## 2016-03-28 DIAGNOSIS — Z79899 Other long term (current) drug therapy: Secondary | ICD-10-CM | POA: Diagnosis not present

## 2016-03-28 DIAGNOSIS — M351 Other overlap syndromes: Secondary | ICD-10-CM | POA: Diagnosis not present

## 2016-03-28 DIAGNOSIS — J301 Allergic rhinitis due to pollen: Secondary | ICD-10-CM

## 2016-03-28 DIAGNOSIS — J4530 Mild persistent asthma, uncomplicated: Secondary | ICD-10-CM

## 2016-03-28 DIAGNOSIS — K219 Gastro-esophageal reflux disease without esophagitis: Secondary | ICD-10-CM

## 2016-03-28 MED ORDER — AMOXICILLIN 250 MG/5ML PO SUSR: ORAL | Status: DC

## 2016-03-28 MED ORDER — FLUTICASONE PROPIONATE 50 MCG/ACT NA SUSP: 2.0000 | Freq: Every day | NASAL | Status: DC | PRN

## 2016-03-28 NOTE — Progress Notes (Signed)
[address] [place] [address] Dept: [phone]  FOLLOW UP NOTE  Patient ID: [name], female    DOB: Aug 24, 1961  Age: 55 y.o. MRN: [id number] Date of Office Visit: 03/28/2016  Assessment Chief Complaint: Allergies and Asthma  HPI [name] presents for follow-up of asthma, allergic rhinitis and recurrent infections. She recently had an episode of cellulitis. She did not try amoxicillin because she developed the episode of cellulitis.. I reviewed her lab work. Her serum immunoglobulins were not low enough to be eligible for IVIG. She had a good response to Pneumovax. She is not using Qvar on a daily basis. In the past montelukast did not help her. Her mannose  binding lectin was low which could explain some of her infections with bacterial agents. She continues to avoid peanuts, shellfish, Rice and fish. She had an allergic reaction to fresh water fish. Her serum Immunocap  IgE to a variety of salt water fish was negative  Current medications Zyrtec 10 mg once a day, Proventil 2 puffs twice a day, fluticasone 2 sprays per nostril once a day if needed for stuffy nose and Qvar 40 one puff twice a day to prevent coughing or wheezing. If she has an allergic reaction she takes  Benadryl 4 teaspoonfuls every 4 hours and if she has life threatening symptoms she  Injects with EpiPen 0.3 mg. Her other medications are outlined in the chart   Drug Allergies:  Allergies  Allergen Reactions  . Fish Allergy Anaphylaxis  . Fish Oil [Omega-3 Fatty Acids] Swelling    Throat swelling   . Gabapentin Other (See Comments)    Anxiety on high doses  . Ipratropium Shortness Of Breath  . Ipratropium-Albuterol Other (See Comments) and Shortness Of Breath    Wheezing becomes worse, can take albuterol with ipratropium  . Red Yeast Rice [Cholestin] Other (See Comments)    Muscle pain and severe joint pain.   . Atrovent Other (See Comments)    Wheezing becomes worse  . Crestor [Rosuvastatin]  Other (See Comments)    Extreme joint pain Extreme joint pain, to this & other statins  . Shellfish Allergy   . Suprep [Na Sulfate-K Sulfate-Mg Sulf] Nausea And Vomiting  . Tape     Electrodes causes skin breakdown  . Penicillins Rash and Other (See Comments)    Unknown childhood reaction; Tolerates Keflex.   To undergo PCN challenge with allergist 02/2016    Physical Exam: BP 122/78 mmHg  Pulse 78  Temp(Src) 98.2 F (36.8 C) (Oral)  Resp 18   Physical Exam  Constitutional: She is oriented to person, place, and time. She appears well-developed and well-nourished.  HENT:  Eyes normal. Ears normal. Nose normal. Pharynx normal.  Neck: Neck supple. No thyromegaly present.  Cardiovascular:  S1 and S2 normal no murmurs  Pulmonary/Chest:  Clear to percussion auscultation  Lymphadenopathy:    She has no cervical adenopathy.  Neurological: She is alert and oriented to person, place, and time.  Psychiatric: She has a normal mood and affect. Her behavior is normal. Judgment and thought content normal.  Vitals reviewed.   Diagnostics:  FVC 2.78 L FEV1 2.22 L. Predicted FVC 3.19 L predicted FEV1 2.51 L-the spirometry is in the normal range  Assessment and Plan: 1. Mild persistent asthma, uncomplicated   2. Allergy with anaphylaxis due to food, subsequent encounter   3. Allergic rhinitis due to pollen   4. Mixed connective tissue disease (HCC)   5. Current use of beta blocker  6. Gastroesophageal reflux disease without esophagitis   7.      Insulin-dependent diabetes-well controlled  Meds ordered this encounter  Medications  . fluticasone (FLONASE) 50 MCG/ACT nasal spray    Sig: Place 2 sprays into both nostrils daily as needed for allergies or rhinitis.    Dispense:  16 g    Refill:  5  . amoxicillin (AMOXIL) 250 MG/5ML suspension    Sig: Take one teaspoonful 3 times a day for 2 days    Dispense:  80 mL    Refill:  0    Patient Instructions  Amoxicillin 250 mg per 5  ML-take one teaspoonful 3 times a day for 2 days. Stop if you have any itching You  should have a Pneumovax antibody response repeated in 6 months Continue on your other medications  Zyrtec 10 mg once a day Proventil 2 puffs every 4 hours if needed for wheezing or coughing spells Fluticasone 2 sprays per nostril once a day if needed for stuffy nose Qvar 40 one puff twice a day to prevent coughing or wheezing If you have an allergic reaction take Benadryl 4 teaspoonfuls every 4 hours and if you have life-threatening symptoms inject  with EpiPen 0.3 mg Call me if you are not doing well on this treatment plan Could consider a gradual oral challenge to salt water fish but continue avoiding peanuts, shellfish, Rice and fish until we do a gradual oral challenge to salt water fish    Return in about 6 months (around 09/28/2016).    Thank you for the opportunity to care for this patient.  Please do not hesitate to contact me with questions.  Tonette Bihari, M.D.  [facility] [address] [place], [place] [address] [phone]

## 2016-03-28 NOTE — Patient Instructions (Addendum)
Amoxicillin 250 mg per 5 ML-take one teaspoonful 3 times a day for 2 days. Stop if you have any itching You  should have a Pneumovax antibody response repeated in 6 months Continue on your other medications  Zyrtec 10 mg once a day Proventil 2 puffs every 4 hours if needed for wheezing or coughing spells Fluticasone 2 sprays per nostril once a day if needed for stuffy nose Qvar 40 one puff twice a day to prevent coughing or wheezing If you have an allergic reaction take Benadryl 4 teaspoonfuls every 4 hours and if you have life-threatening symptoms inject  with EpiPen 0.3 mg Call me if you are not doing well on this treatment plan Could consider a gradual oral challenge to salt water fish but continue avoiding peanuts, shellfish, Rice and fish until we do a gradual oral challenge to salt water fish

## 2016-04-12 ENCOUNTER — Ambulatory Visit: Payer: BLUE CROSS/BLUE SHIELD | Admitting: Family Medicine

## 2016-04-28 ENCOUNTER — Emergency Department (HOSPITAL_BASED_OUTPATIENT_CLINIC_OR_DEPARTMENT_OTHER): Payer: BLUE CROSS/BLUE SHIELD

## 2016-04-28 ENCOUNTER — Inpatient Hospital Stay (HOSPITAL_BASED_OUTPATIENT_CLINIC_OR_DEPARTMENT_OTHER)
Admission: EM | Admit: 2016-04-28 | Discharge: 2016-05-04 | DRG: 872 | Disposition: A | Discharge status: Home / Self Care | Payer: BLUE CROSS/BLUE SHIELD | Attending: Internal Medicine | Admitting: Internal Medicine

## 2016-04-28 ENCOUNTER — Encounter (HOSPITAL_BASED_OUTPATIENT_CLINIC_OR_DEPARTMENT_OTHER): Payer: Self-pay | Admitting: *Deleted

## 2016-04-28 DIAGNOSIS — E785 Hyperlipidemia, unspecified: Secondary | ICD-10-CM | POA: Diagnosis present

## 2016-04-28 DIAGNOSIS — E871 Hypo-osmolality and hyponatremia: Secondary | ICD-10-CM | POA: Diagnosis present

## 2016-04-28 DIAGNOSIS — E1165 Type 2 diabetes mellitus with hyperglycemia: Secondary | ICD-10-CM | POA: Diagnosis present

## 2016-04-28 DIAGNOSIS — Z87891 Personal history of nicotine dependence: Secondary | ICD-10-CM

## 2016-04-28 DIAGNOSIS — E039 Hypothyroidism, unspecified: Secondary | ICD-10-CM | POA: Diagnosis present

## 2016-04-28 DIAGNOSIS — E861 Hypovolemia: Secondary | ICD-10-CM | POA: Diagnosis present

## 2016-04-28 DIAGNOSIS — Z6841 Body Mass Index (BMI) 40.0 and over, adult: Secondary | ICD-10-CM

## 2016-04-28 DIAGNOSIS — I110 Hypertensive heart disease with heart failure: Secondary | ICD-10-CM | POA: Diagnosis present

## 2016-04-28 DIAGNOSIS — L03115 Cellulitis of right lower limb: Secondary | ICD-10-CM | POA: Diagnosis present

## 2016-04-28 DIAGNOSIS — M797 Fibromyalgia: Secondary | ICD-10-CM | POA: Diagnosis present

## 2016-04-28 DIAGNOSIS — Z7982 Long term (current) use of aspirin: Secondary | ICD-10-CM

## 2016-04-28 DIAGNOSIS — Z955 Presence of coronary angioplasty implant and graft: Secondary | ICD-10-CM | POA: Diagnosis not present

## 2016-04-28 DIAGNOSIS — E1142 Type 2 diabetes mellitus with diabetic polyneuropathy: Secondary | ICD-10-CM | POA: Diagnosis present

## 2016-04-28 DIAGNOSIS — R600 Localized edema: Secondary | ICD-10-CM | POA: Diagnosis not present

## 2016-04-28 DIAGNOSIS — Z7951 Long term (current) use of inhaled steroids: Secondary | ICD-10-CM | POA: Diagnosis not present

## 2016-04-28 DIAGNOSIS — N179 Acute kidney failure, unspecified: Secondary | ICD-10-CM | POA: Diagnosis present

## 2016-04-28 DIAGNOSIS — E119 Type 2 diabetes mellitus without complications: Secondary | ICD-10-CM

## 2016-04-28 DIAGNOSIS — K219 Gastro-esophageal reflux disease without esophagitis: Secondary | ICD-10-CM | POA: Diagnosis present

## 2016-04-28 DIAGNOSIS — K589 Irritable bowel syndrome without diarrhea: Secondary | ICD-10-CM | POA: Diagnosis present

## 2016-04-28 DIAGNOSIS — M7989 Other specified soft tissue disorders: Secondary | ICD-10-CM | POA: Diagnosis not present

## 2016-04-28 DIAGNOSIS — Z794 Long term (current) use of insulin: Secondary | ICD-10-CM | POA: Diagnosis not present

## 2016-04-28 DIAGNOSIS — R609 Edema, unspecified: Secondary | ICD-10-CM

## 2016-04-28 DIAGNOSIS — A419 Sepsis, unspecified organism: Secondary | ICD-10-CM | POA: Diagnosis not present

## 2016-04-28 DIAGNOSIS — A047 Enterocolitis due to Clostridium difficile: Secondary | ICD-10-CM | POA: Diagnosis present

## 2016-04-28 DIAGNOSIS — M351 Other overlap syndromes: Secondary | ICD-10-CM | POA: Diagnosis present

## 2016-04-28 DIAGNOSIS — M79609 Pain in unspecified limb: Secondary | ICD-10-CM | POA: Diagnosis not present

## 2016-04-28 DIAGNOSIS — I2510 Atherosclerotic heart disease of native coronary artery without angina pectoris: Secondary | ICD-10-CM | POA: Diagnosis present

## 2016-04-28 DIAGNOSIS — R17 Unspecified jaundice: Secondary | ICD-10-CM | POA: Diagnosis present

## 2016-04-28 DIAGNOSIS — E669 Obesity, unspecified: Secondary | ICD-10-CM | POA: Diagnosis present

## 2016-04-28 DIAGNOSIS — J4530 Mild persistent asthma, uncomplicated: Secondary | ICD-10-CM | POA: Diagnosis present

## 2016-04-28 DIAGNOSIS — R079 Chest pain, unspecified: Secondary | ICD-10-CM

## 2016-04-28 DIAGNOSIS — E11649 Type 2 diabetes mellitus with hypoglycemia without coma: Secondary | ICD-10-CM | POA: Diagnosis present

## 2016-04-28 DIAGNOSIS — E860 Dehydration: Secondary | ICD-10-CM | POA: Diagnosis present

## 2016-04-28 DIAGNOSIS — I10 Essential (primary) hypertension: Secondary | ICD-10-CM | POA: Diagnosis not present

## 2016-04-28 DIAGNOSIS — T1490XA Injury, unspecified, initial encounter: Secondary | ICD-10-CM

## 2016-04-28 DIAGNOSIS — G894 Chronic pain syndrome: Secondary | ICD-10-CM | POA: Diagnosis present

## 2016-04-28 LAB — CBC WITH DIFFERENTIAL/PLATELET
Band Neutrophils: 6 %
Basophils Absolute: 0 10*3/uL (ref 0.0–0.1)
Basophils Relative: 0 %
Blasts: 0 %
Eosinophils Absolute: 0 10*3/uL (ref 0.0–0.7)
Eosinophils Relative: 0 %
HCT: 45.9 % (ref 36.0–46.0)
Hemoglobin: 15.4 g/dL — ABNORMAL HIGH (ref 12.0–15.0)
Lymphocytes Relative: 6 %
Lymphs Abs: 1.6 10*3/uL (ref 0.7–4.0)
MCH: 28.5 pg (ref 26.0–34.0)
MCHC: 33.6 g/dL (ref 30.0–36.0)
MCV: 84.8 fL (ref 78.0–100.0)
Metamyelocytes Relative: 0 %
Monocytes Absolute: 2.1 10*3/uL — ABNORMAL HIGH (ref 0.1–1.0)
Monocytes Relative: 8 %
Myelocytes: 0 %
Neutro Abs: 22.9 10*3/uL — ABNORMAL HIGH (ref 1.7–7.7)
Neutrophils Relative %: 80 %
Platelets: 234 10*3/uL (ref 150–400)
Promyelocytes Absolute: 0 %
RBC: 5.41 MIL/uL — ABNORMAL HIGH (ref 3.87–5.11)
RDW: 14.4 % (ref 11.5–15.5)
WBC: 26.6 10*3/uL — ABNORMAL HIGH (ref 4.0–10.5)
nRBC: 0 /100 WBC

## 2016-04-28 LAB — COMPREHENSIVE METABOLIC PANEL
ALT: 29 U/L (ref 14–54)
AST: 34 U/L (ref 15–41)
Albumin: 4 g/dL (ref 3.5–5.0)
Alkaline Phosphatase: 67 U/L (ref 38–126)
Anion gap: 15 (ref 5–15)
BUN: 13 mg/dL (ref 6–20)
CO2: 23 mmol/L (ref 22–32)
Calcium: 9.5 mg/dL (ref 8.9–10.3)
Chloride: 91 mmol/L — ABNORMAL LOW (ref 101–111)
Creatinine, Ser: 1.12 mg/dL — ABNORMAL HIGH (ref 0.44–1.00)
GFR calc Af Amer: >60 mL/min (ref >60)
GFR calc non Af Amer: 55 mL/min — ABNORMAL LOW (ref >60)
Glucose, Bld: 308 mg/dL — ABNORMAL HIGH (ref 65–99)
Potassium: 3.9 mmol/L (ref 3.5–5.1)
Sodium: 129 mmol/L — ABNORMAL LOW (ref 135–145)
Total Bilirubin: 1.5 mg/dL — ABNORMAL HIGH (ref 0.3–1.2)
Total Protein: 7.9 g/dL (ref 6.5–8.1)

## 2016-04-28 LAB — I-STAT CG4 LACTIC ACID, ED
Lactic Acid, Venous: 4.82 mmol/L (ref 0.5–1.9)
Lactic Acid, Venous: 1.51 mmol/L (ref 0.5–1.9)

## 2016-04-28 LAB — URINALYSIS, ROUTINE W REFLEX MICROSCOPIC
Bilirubin Urine: NEGATIVE
Glucose, UA: 100 mg/dL — AB
Hgb urine dipstick: NEGATIVE
Ketones, ur: NEGATIVE mg/dL
Leukocytes, UA: NEGATIVE
Nitrite: NEGATIVE
Protein, ur: NEGATIVE mg/dL
Specific Gravity, Urine: 1.004 — ABNORMAL LOW (ref 1.005–1.030)
pH: 6.5 (ref 5.0–8.0)

## 2016-04-28 LAB — CBG MONITORING, ED: Glucose-Capillary: 348 mg/dL — ABNORMAL HIGH (ref 65–99)

## 2016-04-28 MED ORDER — ONDANSETRON HCL 4 MG/2ML IJ SOLN
4.0000 mg | Freq: Once | INTRAMUSCULAR | Status: AC
  Administered 2016-04-28: 4 mg via INTRAVENOUS
  Filled 2016-04-28: qty 2

## 2016-04-28 MED ORDER — METRONIDAZOLE IN NACL 5-0.79 MG/ML-% IV SOLN
500.0000 mg | Freq: Once | INTRAVENOUS | Status: AC
Start: 2016-04-28 — End: 2016-04-28
  Administered 2016-04-28: 500 mg via INTRAVENOUS
  Filled 2016-04-28: qty 100

## 2016-04-28 MED ORDER — DEXTROSE 5 % IV SOLN
2.0000 g | Freq: Three times a day (TID) | INTRAVENOUS | Status: DC
  Filled 2016-04-28: qty 2

## 2016-04-28 MED ORDER — SODIUM CHLORIDE 0.9 % IV BOLUS (SEPSIS)
2000.0000 mL | Freq: Once | INTRAVENOUS | Status: AC
  Administered 2016-04-28: 2000 mL via INTRAVENOUS

## 2016-04-28 MED ORDER — ACETAMINOPHEN 500 MG PO TABS
1000.0000 mg | ORAL_TABLET | Freq: Once | ORAL | Status: AC
  Administered 2016-04-28: 1000 mg via ORAL
  Filled 2016-04-28: qty 2

## 2016-04-28 MED ORDER — METRONIDAZOLE IN NACL 5-0.79 MG/ML-% IV SOLN
500.0000 mg | Freq: Three times a day (TID) | INTRAVENOUS | Status: DC
  Administered 2016-04-29 – 2016-05-01 (×7): 500 mg via INTRAVENOUS
  Filled 2016-04-28 (×10): qty 100

## 2016-04-28 MED ORDER — VANCOMYCIN HCL IN DEXTROSE 750-5 MG/150ML-% IV SOLN
750.0000 mg | Freq: Two times a day (BID) | INTRAVENOUS | Status: DC
  Administered 2016-04-29: 750 mg via INTRAVENOUS
  Filled 2016-04-28 (×4): qty 150

## 2016-04-28 MED ORDER — VANCOMYCIN 50 MG/ML ORAL SOLUTION
125.0000 mg | Freq: Four times a day (QID) | ORAL | Status: DC
  Filled 2016-04-28: qty 2.5

## 2016-04-28 MED ORDER — SODIUM CHLORIDE 0.9 % IV BOLUS (SEPSIS)
1000.0000 mL | Freq: Once | INTRAVENOUS | Status: AC
  Administered 2016-04-28: 1000 mL via INTRAVENOUS

## 2016-04-28 MED ORDER — VANCOMYCIN HCL IN DEXTROSE 1-5 GM/200ML-% IV SOLN
1000.0000 mg | Freq: Once | INTRAVENOUS | Status: AC
  Administered 2016-04-28: 1000 mg via INTRAVENOUS

## 2016-04-28 MED ORDER — DEXTROSE 5 % IV SOLN
2.0000 g | Freq: Three times a day (TID) | INTRAVENOUS | Status: DC
  Filled 2016-04-28 (×2): qty 2

## 2016-04-28 MED ORDER — AZTREONAM 1 G IJ SOLR
INTRAMUSCULAR | Status: AC
  Administered 2016-04-28: 20:00:00
  Filled 2016-04-28: qty 2

## 2016-04-28 MED ORDER — VANCOMYCIN HCL IN DEXTROSE 1-5 GM/200ML-% IV SOLN
1000.0000 mg | Freq: Once | INTRAVENOUS | Status: DC
  Filled 2016-04-28: qty 200

## 2016-04-28 MED ORDER — DEXTROSE 5 % IV SOLN
2.0000 g | Freq: Once | INTRAVENOUS | Status: AC
  Administered 2016-04-28: 2 g via INTRAVENOUS
  Filled 2016-04-28: qty 2

## 2016-04-28 MED ORDER — CIPROFLOXACIN HCL 500 MG PO TABS: 500.0000 mg | ORAL_TABLET | Freq: Once | ORAL | Status: DC

## 2016-04-28 MED ORDER — METRONIDAZOLE IN NACL 5-0.79 MG/ML-% IV SOLN: 500.0000 mg | Freq: Three times a day (TID) | INTRAVENOUS | Status: DC

## 2016-04-28 MED ORDER — SODIUM CHLORIDE 0.9 % IV BOLUS (SEPSIS)
500.0000 mL | Freq: Once | INTRAVENOUS | Status: AC
  Administered 2016-04-28: 500 mL via INTRAVENOUS

## 2016-04-28 MED ORDER — DOXYCYCLINE HYCLATE 100 MG PO TABS: 100.0000 mg | ORAL_TABLET | Freq: Once | ORAL | Status: DC

## 2016-04-28 NOTE — ED Triage Notes (Signed)
Pt c/o vomiting x 1 day and right leg abscess x 2 days

## 2016-04-28 NOTE — Progress Notes (Signed)
Called re: transfer of pt by Dr. Jacqulyn Bath at 2033.  55 yo F with CAD, HFpEF, IDDM, HTN presents with leg swelling and sepsis.  BP 110/62 (BP Location: Right Arm)   Pulse 110   Temp 99.3 F (37.4 C)   Resp 15   Ht 5\' 2"  (1.575 m)   Wt 116.6 kg (257 lb)   SpO2 92%   BMI 47.01 kg/m  has had a few days vomiting, subjective fevers, feels redness and swelling is spreading on leg.  Lactic acid 4.85.  WBC 26.6K.  Glucose 350. Na 129.  Cr 1.12.  Tbili 1.5.  They are sending C diff.  Has gotten vanc, Azactam, and Flagyl for sepsis unknown source and penicillin allergy.  Will start empiric oral vanc given concern for Cdiff, still pending sample.  EDP will complete 30 cc/kg bolus.  Patient's heart rate improving and BP is normal and mentation is clear, so he defers CCM consult.  Will transfer to SDU here, inpatient status.

## 2016-04-28 NOTE — ED Notes (Signed)
IStat machine is not crossing over, second Lactic acid is 1.57

## 2016-04-28 NOTE — ED Provider Notes (Signed)
Emergency Department Provider Note  By signing my name below, I, Emmanuella Mensah, attest that this documentation has been prepared under the direction and in the presence of Maia Plan, MD. Electronically Signed: Angelene Giovanni, ED Scribe. 04/28/16. 7:05 PM.   Maia Plan, MD has reviewed the triage vital signs and the nursing notes.   HISTORY  Chief Complaint Emesis   HPI Comments: [name] is a 55 y.o. female with a hx of C. Diff diarrhea (treated 1.5 years ago), CHF, CAD, DM, Eczema, and Hypertension who presents to the Emergency Department complaining of gradually worsening moderately painful area of swelling and redness from her right upper leg down to her right lower leg onset yesterday. She reports associated multiple episodes of non-bloody vomiting. No alleviating factors noted. Pt has not tried any medications PTA. Pt was admitted to the hospital on 03/09/16 for cellulitis to the right leg and discharged with Amoxicillin. She reports that she was unable to take the Amoxicillin that was prescribed at that time. Pt is currently on Brilinta s/p stent placement, Gabapentin, and Insulin and states that she has not taken them PTA. She has an allergy to Penicillin. She denies any fever, chills, or diarrhea.   Past Medical History:  Diagnosis Date  . Asthma   . C. difficile diarrhea   . CHF (congestive heart failure) (HCC) 09/2015   cath with normal LM, 30% LAD, 85% mid RCA and 95% distal RCA s/p PCI of the mid to distal RCA and now on DAPT with ASA and Ticagrelor.    . Chronic pain syndrome   . Coronary artery disease   . Diabetes mellitus   . Eczema   . Excessive daytime sleepiness 11/26/2015  . GERD (gastroesophageal reflux disease)   . Hyperlipidemia   . Hypertension   . IBS (irritable bowel syndrome)   . Mixed connective tissue disease (HCC)   . Thyroid disease     Patient Active Problem List   Diagnosis Date Noted  . Sepsis (HCC) 04/28/2016  . Cellulitis  of right leg 03/09/2016  . Cellulitis of leg, right 03/09/2016  . Mild persistent asthma 02/15/2016  . Allergic rhinitis due to pollen 02/15/2016  . Allergy with anaphylaxis due to food 02/15/2016  . Coronary artery disease involving autologous artery coronary bypass graft without angina pectoris 02/15/2016  . Multiple drug allergies 02/15/2016  . Anaphylactic reaction due to food 02/10/2016  . Severe persistent asthma 02/10/2016  . H/O mixed connective tissue disease 02/10/2016  . Type 2 diabetes mellitus with complication (HCC) 02/10/2016  . Current use of beta blocker 02/10/2016  . Coronary artery disease involving native coronary artery of native heart without angina pectoris 02/10/2016  . Gastroesophageal reflux disease without esophagitis 02/10/2016  . Immunodeficiency (HCC) 02/10/2016  . Atopic eczema 02/10/2016  . Cervical nerve root disorder 12/15/2015  . Lumbar radiculopathy 12/15/2015  . Excessive daytime sleepiness 11/26/2015  . Daytime somnolence 11/26/2015  . Hypomagnesemia 11/24/2015  . Breath shortness 11/24/2015  . CAD in native artery 10/28/2015  . Acquired hypothyroidism 10/01/2015  . Abnormal cardiovascular stress test 09/15/2015  . CHF (congestive heart failure) (HCC) 09/12/2015  . Congestive heart failure (HCC) 09/12/2015  . Awareness of heartbeats 08/23/2015  . C. difficile colitis 03/02/2015  . C. difficile diarrhea 02/12/2015  . Diabetes mellitus without complication (HCC) 02/11/2015  . Leg edema 02/11/2015  . Nausea vomiting and diarrhea 02/10/2015  . Acute vaginitis 11/16/2014  . Adiposity 06/19/2014  . Abnormal LFTs 03/26/2014  .  B-complex deficiency 03/26/2014  . Benign essential HTN 03/26/2014  . Chronic pain associated with significant psychosocial dysfunction 03/26/2014  . Diaphragmatic hernia 03/26/2014  . Acid reflux 03/26/2014  . Hammer toe 03/26/2014  . H/O neoplasm 03/26/2014  . Adult hypothyroidism 03/26/2014  . Adaptive colitis  03/26/2014  . Diabetic polyneuropathy (HCC) 03/26/2014  . Deafness, sensorineural 03/26/2014  . Type 2 diabetes mellitus (HCC) 03/26/2014  . Allergy to environmental factors 01/20/2014  . Fibromyalgia 01/20/2014  . HLD (hyperlipidemia) 01/20/2014  . Degeneration of intervertebral disc of lumbar region 01/20/2014  . 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 Avitaminosis D 01/20/2014  . Type II or unspecified type diabetes mellitus without mention of complication, not stated as uncontrolled 11/11/2012  . Essential hypertension, benign 11/11/2012  . Other and unspecified hyperlipidemia 11/11/2012  . Mixed connective tissue disease (HCC) 11/11/2012  . Hypothyroidism 11/11/2012  . Asthma 11/11/2012  . 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HCC) 02/20/2012  . 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 ANA positive 07/17/2011    Past Surgical History:  Procedure Laterality Date  . ABDOMINAL HYSTERECTOMY    . APPENDECTOMY    . BREAST SURGERY    . CARDIAC CATHETERIZATION    . CHOLECYSTECTOMY    . CORONARY ANGIOPLASTY     normal LM, 30% LAD, 85% mid RCA and 95% distal RCA s/p PCI of the mid to distal RCA and now on DAPT with ASA and Ticagrelor.    . LEG SURGERY Left    muscle biospy  . WRIST SURGERY Left     Current Outpatient Rx  . Order #: [id number] Class: Print  . Order #: [id number] Class: Historical Med  . Order #: [id number] Class: Historical Med  . Order #: [id number] Class: Historical Med  . Order #: [id number] Class: Historical Med  . Order #: [id number] Class: Normal  . Order #: [id number] Class: Historical Med  . Order #: [id number] Class: Historical Med  . Order #: [id number] Class: Historical Med  . Order #: [id number] Class: Historical Med  . Order #: [id number] Class: Print  . Order #: [id number] Class: Historical Med  . Order #: [id number] Class: Normal  . Order #: [id number] Class: Historical Med  . Order #: [id number] Class:  Normal  . Order #: [id number] Class: Historical Med  . Order #: [id number] Class: Historical Med  . Order #: [id number] Class: Historical Med  . Order #: [id number] Class: Historical Med  . Order #: [id number] Class: Historical Med  . Order #: [id number] Class: Historical Med  . Order #: [id number] Class: Print  . Order #: [id number] Class: Historical Med  . Order #: [id number] Class: Historical Med  . Order #: [id number] Class: Historical Med  . Order #: [id number] Class: Historical Med  . Order #: [id number] Class: Historical Med  . Order #: [id number] Class: Historical Med  . Order #: [id number] Class: Historical Med  . Order #: [id number] Class: Historical Med  . Order #: [id number] Class: Historical Med  . Order #: [id number] Class: Historical Med  . Order #: [id number] Class: Print  . Order #: [id number] Class: Historical Med  . Order #: [id number] Class: Historical Med  . Order #: [id number] Class: Historical Med  . Order #: [id number] Class: Historical Med  . Order #: [id number] Class: Print  . Order #: [id number] Class: Historical Med  . Order #: [id number] Class: Historical Med  . Order #: [id number] Class: Historical Med  . Order #: [id number] Class: Historical Med    Allergies Fish allergy; Fish oil [omega-3 fatty acids]; Gabapentin; Ipratropium; Ipratropium-albuterol; Red yeast rice [cholestin]; Atrovent; Crestor [rosuvastatin]; Shellfish allergy; Suprep [na sulfate-k sulfate-mg sulf]; Tape; and Penicillins  Family  History  Problem Relation Age of Onset  . CAD Mother   . Hypertension Mother   . Heart attack Mother   . CAD Father   . Heart attack Father   . Allergic rhinitis Father   . Asthma Father   . Hypertension Brother   . Hypertension Brother   . Pancreatic cancer Paternal Aunt   . Breast cancer Paternal Aunt     Social History Social History  Substance Use Topics  . Smoking status: Former Smoker    Packs/day: 1.00    Years: 20.00    Types: Cigarettes    Quit date: 02/11/2004  . Smokeless tobacco: Never  Used  . Alcohol use Yes     Comment: once a month    Review of Systems Constitutional: No fever/chills Eyes: No visual changes. ENT: No sore throat. Cardiovascular: Denies chest pain. Respiratory: Denies shortness of breath. Gastrointestinal: No abdominal pain.  No nausea, Vomiting.  No diarrhea.  No constipation. Genitourinary: Negative for dysuria. Musculoskeletal: Negative for back pain. Skin: Rash. Neurological: Negative for headaches, focal weakness or numbness.  10-point ROS otherwise negative.  ____________________________________________   PHYSICAL EXAM:  VITAL SIGNS: ED Triage Vitals  Enc Vitals Group     BP 04/28/16 1832 118/86     Pulse Rate 04/28/16 1832 (!) 145     Resp 04/28/16 1832 (!) 28     Temp 04/28/16 1832 99.9 F (37.7 C)     Temp Source 04/28/16 1832 Oral     SpO2 04/28/16 1832 98 %     Weight 04/28/16 1833 257 lb (116.6 kg)     Height 04/28/16 1833 5\' 2"  (1.575 m)     Pain Score 04/28/16 1821 8   Constitutional: Alert and oriented. Well appearing and in no acute distress. Eyes: Conjunctivae are normal. PERRL. EOMI. Head: Atraumatic. Nose: No congestion/rhinnorhea. Mouth/Throat: Mucous membranes are moist.  Oropharynx non-erythematous. Neck: No stridor.  No meningeal signs.  Cardiovascular: Sinus tachycardia. Good peripheral circulation. Grossly normal heart sounds.   Respiratory: Normal respiratory effort.  No retractions. Lungs CTAB. Gastrointestinal: Soft and nontender. No distention.  Musculoskeletal: No lower extremity tenderness nor edema. No gross deformities of extremities. Neurologic:  Normal speech and language. No gross focal neurologic deficits are appreciated.  Skin:  Skin is warm, dry and intact. RLE redness and warmth involving the lower leg and extending medially over the medial leg. No crepitus.  Psychiatric: Mood and affect are normal. Speech and behavior are normal.     ____________________________________________   DIAGNOSTIC STUDIES: Oxygen Saturation is 98% on RA, normal by my interpretation.    COORDINATION OF CARE: 7:04 PM- Pt advised of plan for treatment and pt agrees. Pt will receive lab work for further evaluation. She will also receive Zofran, Azactam, Flagyl IV, and Vancomycin IV.    LABS (all labs ordered are listed, but only abnormal results are displayed)  Labs Reviewed  COMPREHENSIVE METABOLIC PANEL - Abnormal; Notable for the following:       Result Value   Sodium 129 (*)    Chloride 91 (*)    Glucose, Bld 308 (*)    Creatinine, Ser 1.12 (*)    Total Bilirubin 1.5 (*)    GFR calc non Af Amer 55 (*)    All other components within normal limits  CBC WITH DIFFERENTIAL/PLATELET - Abnormal; Notable for the following:    WBC 26.6 (*)    RBC 5.41 (*)    Hemoglobin 15.4 (*)    Neutro Abs  22.9 (*)    Monocytes Absolute 2.1 (*)    All other components within normal limits  URINALYSIS, ROUTINE W REFLEX MICROSCOPIC (NOT AT [facility]) - Abnormal; Notable for the following:    Specific Gravity, Urine 1.004 (*)    Glucose, UA 100 (*)    All other components within normal limits  I-STAT CG4 LACTIC ACID, ED - Abnormal; Notable for the following:    Lactic Acid, Venous 4.82 (*)    All other components within normal limits  CBG MONITORING, ED - Abnormal; Notable for the following:    Glucose-Capillary 348 (*)    All other components within normal limits  CULTURE, BLOOD (ROUTINE X 2)  CULTURE, BLOOD (ROUTINE X 2)  URINE CULTURE  C DIFFICILE QUICK SCREEN W PCR REFLEX  I-STAT CG4 LACTIC ACID, ED   ____________________________________________  EKG  Reviewed in MUSE. Sinus tachycardia. No STEMI.  ____________________________________________  RADIOLOGY  Dg Chest Port 1 View  Result Date: 04/28/2016 CLINICAL DATA:  Acute onset of vomiting and fever. Right leg swelling and pain. Initial encounter. EXAM: PORTABLE CHEST 1 VIEW COMPARISON:  Chest radiograph from 09/28/2015 FINDINGS: The lungs  are well-aerated. Vascular congestion is noted. Mild left basilar opacity may reflect atelectasis or mild pneumonia. There is no evidence of pleural effusion or pneumothorax. The cardiomediastinal silhouette is within normal limits. No acute osseous abnormalities are seen. IMPRESSION: Vascular congestion noted. Mild left basilar opacity may reflect atelectasis or mild pneumonia. Electronically Signed   By: Roanna Raider M.D.   On: 04/28/2016 19:38    ____________________________________________   PROCEDURES  Procedure(s) performed:   Procedures  CRITICAL CARE Performed by: Maia Plan Total critical care time: 45 minutes Critical care time was exclusive of separately billable procedures and treating other patients. Critical care was necessary to treat or prevent imminent or life-threatening deterioration. Critical care was time spent personally by me on the following activities: development of treatment plan with patient and/or surrogate as well as nursing, discussions with consultants, evaluation of patient's response to treatment, examination of patient, obtaining history from patient or surrogate, ordering and performing treatments and interventions, ordering and review of laboratory studies, ordering and review of radiographic studies, pulse oximetry and re-evaluation of patient's condition.  Alona Bene, MD Emergency Medicine  ____________________________________________   INITIAL IMPRESSION / ASSESSMENT AND PLAN / ED COURSE  Pertinent labs & imaging results that were available during my care of the patient were reviewed by me and considered in my medical decision making (see chart for details).  Patient resents to the emergency department for evaluation of intractable nausea, vomiting, and suddenly worsening right lower external cellulitis. She's had this cellulitis in the past and reports that it seems of suddenly reemerged and is spreading above the knee which is unusual. No  documented fevers at home but she states she feels terrible. No abdominal tenderness. Patient has sinus tachycardia with no ST changes on EKG. I have initiated code sepsis with a lactate greater than 4 and tachycardia with presumed source of cellulitis. Patient does have a documented penicillin allergy. Have ordered antibiotics. Low suspicion clinically for necrotizing fasciitis with no wound crepitus but given the report of rapidly swelling cellulitis will reevaluate frequently.   Patient admitted to hospitalist team and assigned to stepdown bed. Repeat lactate pending. IVF given. Patient with no SOB. Will give additional nausea medication. Will send c. Diff panel and begin oral Vancomycin. Have placed this order but am told by nursing the we do not have oral  Vancomycin at [facility]. She [facility]   10:29 PM  Performed by: Maia PlanJoshua G Long ED Sepsis - Repeat Assessment   Performed at: 10:29 PM  Last Vitals: Blood pressure 114/72, pulse 99, temperature 99.3 F (37.4 C), resp. rate 12, height 5\' 2"  (1.575 m), weight 257 lb (116.6 kg), SpO2 94 %.  Heart:                  NSR. Tachycardia improved.   Lungs:     CTABL. No rales. No tachypnea.   Capillary Refill:   Brisk  Peripheral Pulse (include location): Palpable.    Skin (include color):   Well-perfused. No significant migration or LE cellulitis.   ____________________________________________  FINAL CLINICAL IMPRESSION(S) / ED DIAGNOSES  Final diagnoses:  Sepsis, due to unspecified organism (HCC)  Cellulitis of right lower extremity     MEDICATIONS GIVEN DURING THIS VISIT:  Medications  vancomycin (VANCOCIN) IVPB 1000 mg/200 mL premix (1,000 mg Intravenous Not Given 04/28/16 1915)  aztreonam (AZACTAM) 1 g injection (not administered)  vancomycin (VANCOCIN) IVPB 750 mg/150 ml premix (not administered)  vancomycin (VANCOCIN) 50 mg/mL oral solution 125 mg (not administered)    aztreonam (AZACTAM) 2 g in dextrose 5 % 50 mL IVPB (not administered)  metroNIDAZOLE (FLAGYL) IVPB 500 mg (not administered)  ondansetron (ZOFRAN) injection 4 mg (4 mg Intravenous Given 04/28/16 1850)  aztreonam (AZACTAM) 2 g in dextrose 5 % 50 mL IVPB (0 g Intravenous Stopped 04/28/16 2015)  metroNIDAZOLE (FLAGYL) IVPB 500 mg (0 mg Intravenous Stopped 04/28/16 2213)  vancomycin (VANCOCIN) IVPB 1000 mg/200 mL premix (0 mg Intravenous Stopped 04/28/16 2136)  sodium chloride 0.9 % bolus 2,000 mL (0 mLs Intravenous Stopped 04/28/16 2015)  sodium chloride 0.9 % bolus 1,000 mL (0 mLs Intravenous Stopped 04/28/16 2213)  sodium chloride 0.9 % bolus 500 mL (500 mLs Intravenous New Bag/Given 04/28/16 2218)  acetaminophen (TYLENOL) tablet 1,000 mg (1,000 mg Oral Given 04/28/16 2216)  ondansetron (ZOFRAN) injection 4 mg (4 mg Intravenous Given 04/28/16 2230)     NEW OUTPATIENT MEDICATIONS STARTED DURING THIS VISIT:  None  Documentation performed with the assistance of the scribe. I have reviewed the documentation for accuracy and made changes as necessary.   Note:  This document was prepared using Dragon voice recognition software and may include unintentional dictation errors.  Alona BeneJoshua Long, MD Emergency Medicine    Maia PlanJoshua G Long, MD 04/29/16 [id number]

## 2016-04-28 NOTE — Progress Notes (Addendum)
Pharmacy Antibiotic Note  [name] is a 55 y.o. female admitted on 04/28/2016 with cellulitis.  Pharmacy has been consulted for vancomycin, metronidazole and aztreonam dosing. One time doses have been ordered at [facility]- vancomycin 1g, metronidazole 500mg  and aztreonam 2g.  Plan: -give another vancomycin 1g IV x1 to complete a 2g load, then vancomycin 750mg  IV q12h based on obesity nomogram -metronidazole 500mg  IV q8h -aztreonam 2g IV q8h  Height: 5\' 2"  (157.5 cm) Weight: 257 lb (116.6 kg) IBW/kg (Calculated) : 50.1  Temp (24hrs), Avg:99.9 F (37.7 C), Min:99.9 F (37.7 C), Max:99.9 F (37.7 C)   Recent Labs Lab 04/28/16 1850 04/28/16 1859  WBC 26.6*  --   CREATININE 1.12*  --   LATICACIDVEN  --  4.82*    Estimated Creatinine Clearance: 69.5 mL/min (by C-G formula based on SCr of 1.12 mg/dL).    Allergies  Allergen Reactions  . Fish Allergy Anaphylaxis  . Fish Oil [Omega-3 Fatty Acids] Swelling    Throat swelling   . Gabapentin Other (See Comments)    Anxiety on high doses  . Ipratropium Shortness Of Breath  . Ipratropium-Albuterol Other (See Comments) and Shortness Of Breath    Wheezing becomes worse, can take albuterol with ipratropium  . Red Yeast Rice [Cholestin] Other (See Comments)    Muscle pain and severe joint pain.   . Atrovent Other (See Comments)    Wheezing becomes worse  . Crestor [Rosuvastatin] Other (See Comments)    Extreme joint pain Extreme joint pain, to this & other statins  . Shellfish Allergy   . Suprep [Na Sulfate-K Sulfate-Mg Sulf] Nausea And Vomiting  . Tape     Electrodes causes skin breakdown  . Penicillins Rash and Other (See Comments)    Unknown childhood reaction; Tolerates Keflex.   To undergo PCN challenge with allergist 02/2016    Antimicrobials this admission: Vanc 8/18 >>  Flagyl 8/18 >>  Aztreonam 8/18>>  Dose adjustments this admission: n/a  Microbiology results: 8/18 BCx: sent 8/18 UCx:     Thank you for  allowing pharmacy to be a part of this patient's care.  [name] 04/28/2016 7:34 PM

## 2016-04-29 ENCOUNTER — Encounter (HOSPITAL_COMMUNITY): Payer: Self-pay | Admitting: Family Medicine

## 2016-04-29 DIAGNOSIS — J4530 Mild persistent asthma, uncomplicated: Secondary | ICD-10-CM

## 2016-04-29 DIAGNOSIS — G894 Chronic pain syndrome: Secondary | ICD-10-CM

## 2016-04-29 DIAGNOSIS — M358 Other specified systemic involvement of connective tissue: Secondary | ICD-10-CM

## 2016-04-29 DIAGNOSIS — E871 Hypo-osmolality and hyponatremia: Secondary | ICD-10-CM | POA: Diagnosis present

## 2016-04-29 LAB — CBC
HCT: 38.5 % (ref 36.0–46.0)
Hemoglobin: 12.1 g/dL (ref 12.0–15.0)
MCH: 27.6 pg (ref 26.0–34.0)
MCHC: 31.4 g/dL (ref 30.0–36.0)
MCV: 87.9 fL (ref 78.0–100.0)
Platelets: 160 10*3/uL (ref 150–400)
RBC: 4.38 MIL/uL (ref 3.87–5.11)
RDW: 14.2 % (ref 11.5–15.5)
WBC: 15.1 10*3/uL — ABNORMAL HIGH (ref 4.0–10.5)

## 2016-04-29 LAB — COMPREHENSIVE METABOLIC PANEL
ALT: 22 U/L (ref 14–54)
AST: 20 U/L (ref 15–41)
Albumin: 2.8 g/dL — ABNORMAL LOW (ref 3.5–5.0)
Alkaline Phosphatase: 49 U/L (ref 38–126)
Anion gap: 7 (ref 5–15)
BUN: 9 mg/dL (ref 6–20)
CO2: 22 mmol/L (ref 22–32)
Calcium: 8.3 mg/dL — ABNORMAL LOW (ref 8.9–10.3)
Chloride: 105 mmol/L (ref 101–111)
Creatinine, Ser: 0.77 mg/dL (ref 0.44–1.00)
GFR calc Af Amer: >60 mL/min (ref >60)
GFR calc non Af Amer: >60 mL/min (ref >60)
Glucose, Bld: 247 mg/dL — ABNORMAL HIGH (ref 65–99)
Potassium: 3.9 mmol/L (ref 3.5–5.1)
Sodium: 134 mmol/L — ABNORMAL LOW (ref 135–145)
Total Bilirubin: 0.9 mg/dL (ref 0.3–1.2)
Total Protein: 5.9 g/dL — ABNORMAL LOW (ref 6.5–8.1)

## 2016-04-29 LAB — GLUCOSE, CAPILLARY
Glucose-Capillary: 241 mg/dL — ABNORMAL HIGH (ref 65–99)
Glucose-Capillary: 184 mg/dL — ABNORMAL HIGH (ref 65–99)
Glucose-Capillary: 147 mg/dL — ABNORMAL HIGH (ref 65–99)
Glucose-Capillary: 101 mg/dL — ABNORMAL HIGH (ref 65–99)
Glucose-Capillary: 132 mg/dL — ABNORMAL HIGH (ref 65–99)

## 2016-04-29 LAB — BILIRUBIN, FRACTIONATED(TOT/DIR/INDIR)
Bilirubin, Direct: 0.2 mg/dL (ref 0.1–0.5)
Indirect Bilirubin: 0.7 mg/dL (ref 0.3–0.9)
Total Bilirubin: 0.9 mg/dL (ref 0.3–1.2)

## 2016-04-29 LAB — C DIFFICILE QUICK SCREEN W PCR REFLEX
C Diff antigen: POSITIVE — AB
C Diff toxin: NEGATIVE

## 2016-04-29 LAB — MRSA PCR SCREENING: MRSA by PCR: POSITIVE — AB

## 2016-04-29 LAB — CLOSTRIDIUM DIFFICILE BY PCR: Toxigenic C. Difficile by PCR: POSITIVE — AB

## 2016-04-29 LAB — OSMOLALITY: Osmolality: 288 mOsm/kg (ref 275–295)

## 2016-04-29 MED ORDER — VANCOMYCIN HCL IN DEXTROSE 750-5 MG/150ML-% IV SOLN
750.0000 mg | Freq: Two times a day (BID) | INTRAVENOUS | Status: DC
  Administered 2016-04-29 – 2016-04-30 (×3): 750 mg via INTRAVENOUS
  Filled 2016-04-29 (×6): qty 150

## 2016-04-29 MED ORDER — LEVOTHYROXINE SODIUM 75 MCG PO TABS: 225.0000 ug | ORAL_TABLET | ORAL | Status: DC

## 2016-04-29 MED ORDER — ACETAMINOPHEN 650 MG RE SUPP: 650.0000 mg | Freq: Four times a day (QID) | RECTAL | Status: DC | PRN

## 2016-04-29 MED ORDER — ENOXAPARIN SODIUM 40 MG/0.4ML ~~LOC~~ SOLN
40.0000 mg | Freq: Every day | SUBCUTANEOUS | Status: DC
  Administered 2016-04-29: 40 mg via SUBCUTANEOUS
  Filled 2016-04-29: qty 0.4

## 2016-04-29 MED ORDER — BUDESONIDE 0.25 MG/2ML IN SUSP
0.2500 mg | Freq: Two times a day (BID) | RESPIRATORY_TRACT | Status: DC
  Administered 2016-04-29 – 2016-05-04 (×10): 0.25 mg via RESPIRATORY_TRACT
  Filled 2016-04-29 (×11): qty 2

## 2016-04-29 MED ORDER — GABAPENTIN 400 MG PO CAPS
400.0000 mg | ORAL_CAPSULE | Freq: Two times a day (BID) | ORAL | Status: DC
  Administered 2016-04-29 – 2016-05-04 (×12): 400 mg via ORAL
  Filled 2016-04-29 (×13): qty 1

## 2016-04-29 MED ORDER — LEVOTHYROXINE SODIUM 25 MCG PO TABS: 25.0000 ug | ORAL_TABLET | ORAL | Status: DC

## 2016-04-29 MED ORDER — GABAPENTIN 400 MG PO CAPS: 400.0000 mg | ORAL_CAPSULE | ORAL | Status: DC

## 2016-04-29 MED ORDER — FENTANYL 50 MCG/HR TD PT72
100.0000 ug | MEDICATED_PATCH | TRANSDERMAL | Status: DC
Start: 2016-04-29 — End: 2016-05-04
  Administered 2016-04-29 – 2016-05-03 (×3): 100 ug via TRANSDERMAL
  Filled 2016-04-29 (×5): qty 2

## 2016-04-29 MED ORDER — ASPIRIN EC 81 MG PO TBEC
81.0000 mg | DELAYED_RELEASE_TABLET | Freq: Every day | ORAL | Status: DC
  Administered 2016-04-29 – 2016-05-04 (×6): 81 mg via ORAL
  Filled 2016-04-29 (×6): qty 1

## 2016-04-29 MED ORDER — LEVOTHYROXINE SODIUM 100 MCG PO TABS
200.0000 ug | ORAL_TABLET | ORAL | Status: DC
  Administered 2016-04-29 – 2016-04-30 (×2): 200 ug via ORAL
  Filled 2016-04-29 (×2): qty 2

## 2016-04-29 MED ORDER — SODIUM CHLORIDE 0.9 % IV SOLN
INTRAVENOUS | Status: DC
  Administered 2016-04-29 (×2): via INTRAVENOUS

## 2016-04-29 MED ORDER — TICAGRELOR 90 MG PO TABS
90.0000 mg | ORAL_TABLET | Freq: Two times a day (BID) | ORAL | Status: DC
  Administered 2016-04-29 – 2016-05-04 (×12): 90 mg via ORAL
  Filled 2016-04-29 (×13): qty 1

## 2016-04-29 MED ORDER — OXYCODONE HCL 5 MG PO TABS
10.0000 mg | ORAL_TABLET | ORAL | Status: DC | PRN
Start: 2016-04-29 — End: 2016-05-04
  Administered 2016-04-29 – 2016-05-04 (×13): 10 mg via ORAL
  Filled 2016-04-29 (×14): qty 2

## 2016-04-29 MED ORDER — ONDANSETRON HCL 4 MG PO TABS: 4.0000 mg | ORAL_TABLET | Freq: Four times a day (QID) | ORAL | Status: DC | PRN

## 2016-04-29 MED ORDER — HYDROXYCHLOROQUINE SULFATE 200 MG PO TABS
200.0000 mg | ORAL_TABLET | Freq: Two times a day (BID) | ORAL | Status: DC
  Administered 2016-04-29 – 2016-05-04 (×12): 200 mg via ORAL
  Filled 2016-04-29 (×12): qty 1

## 2016-04-29 MED ORDER — GABAPENTIN 400 MG PO CAPS
700.0000 mg | ORAL_CAPSULE | Freq: Every day | ORAL | Status: DC
  Administered 2016-04-29 – 2016-05-03 (×6): 700 mg via ORAL
  Filled 2016-04-29 (×6): qty 1

## 2016-04-29 MED ORDER — INSULIN ASPART 100 UNIT/ML ~~LOC~~ SOLN: 0.0000 [IU] | Freq: Every day | SUBCUTANEOUS | Status: DC

## 2016-04-29 MED ORDER — INSULIN ASPART 100 UNIT/ML ~~LOC~~ SOLN: 0.0000 [IU] | Freq: Three times a day (TID) | SUBCUTANEOUS | Status: DC

## 2016-04-29 MED ORDER — ENOXAPARIN SODIUM 60 MG/0.6ML ~~LOC~~ SOLN
60.0000 mg | Freq: Every day | SUBCUTANEOUS | Status: DC
  Administered 2016-04-30 – 2016-05-04 (×5): 60 mg via SUBCUTANEOUS
  Filled 2016-04-29 (×6): qty 0.6

## 2016-04-29 MED ORDER — LEVOTHYROXINE SODIUM 100 MCG PO TABS: 200.0000 ug | ORAL_TABLET | ORAL | Status: DC

## 2016-04-29 MED ORDER — INSULIN REGULAR HUMAN (CONC) 500 UNIT/ML ~~LOC~~ SOPN
0.0000 [IU] | PEN_INJECTOR | Freq: Every day | SUBCUTANEOUS | Status: DC
  Administered 2016-04-29 – 2016-05-02 (×2): 50 [IU] via SUBCUTANEOUS

## 2016-04-29 MED ORDER — DEXTROSE 5 % IV SOLN
2.0000 g | INTRAVENOUS | Status: DC
  Administered 2016-04-29 – 2016-05-02 (×4): 2 g via INTRAVENOUS
  Filled 2016-04-29 (×5): qty 2

## 2016-04-29 MED ORDER — SACCHAROMYCES BOULARDII 250 MG PO CAPS
250.0000 mg | ORAL_CAPSULE | Freq: Two times a day (BID) | ORAL | Status: DC
  Administered 2016-04-29 – 2016-05-04 (×12): 250 mg via ORAL
  Filled 2016-04-29 (×12): qty 1

## 2016-04-29 MED ORDER — SODIUM CHLORIDE 0.9 % IV SOLN: INTRAVENOUS | Status: DC

## 2016-04-29 MED ORDER — GUAIFENESIN ER 600 MG PO TB12
1200.0000 mg | ORAL_TABLET | Freq: Two times a day (BID) | ORAL | Status: DC
  Administered 2016-04-29 – 2016-05-04 (×12): 1200 mg via ORAL
  Filled 2016-04-29 (×13): qty 2

## 2016-04-29 MED ORDER — INSULIN REGULAR HUMAN (CONC) 500 UNIT/ML ~~LOC~~ SOPN
0.0000 [IU] | PEN_INJECTOR | Freq: Three times a day (TID) | SUBCUTANEOUS | Status: DC
  Administered 2016-04-29: 75 [IU] via SUBCUTANEOUS
  Administered 2016-04-29 – 2016-04-30 (×3): 50 [IU] via SUBCUTANEOUS
  Administered 2016-05-01 – 2016-05-02 (×2): 100 [IU] via SUBCUTANEOUS
  Administered 2016-05-02: 50 [IU] via SUBCUTANEOUS
  Administered 2016-05-03: 70 [IU] via SUBCUTANEOUS
  Filled 2016-04-29: qty 3

## 2016-04-29 MED ORDER — ACETAMINOPHEN 325 MG PO TABS
650.0000 mg | ORAL_TABLET | Freq: Four times a day (QID) | ORAL | Status: DC | PRN
  Administered 2016-05-04: 650 mg via ORAL
  Filled 2016-04-29: qty 2

## 2016-04-29 MED ORDER — ONDANSETRON HCL 4 MG/2ML IJ SOLN: 4.0000 mg | Freq: Four times a day (QID) | INTRAMUSCULAR | Status: DC | PRN

## 2016-04-29 NOTE — Progress Notes (Signed)
Patient arrived via [facility] from [facility] ED. VSS and resident stated her pain level was 6/10, chronic back pain in multiple sites. Fentanyl patch intact on right upper chest which she changed today at home. See home med list. Enteric precautions initiated due to R/O c-diff. Patient stated she hasn't had any diarrhea today, just an emesis episode. She also stated she hasn't taken any medications today while at home. None of her daily medications. [facility] notified of admission, waiting for orders. Red lower leg is red and inflammed, patient stated he seems to be getting worse thru out the day. Patient able to stand and ambulate. Patient called her husband on admission and updated her room she was admitted to. Will continue to monitor closely.

## 2016-04-29 NOTE — H&P (Signed)
History and Physical  Patient Name: [name]     JXB:[id number]    DOB: April 19, 1961    DOA: 04/28/2016 PCP: Cheral BayHAWKS,ALDENE N, MD   Patient coming from: Home  Chief Complaint: Vomiting, fever, spreading swelling/redness/pain on right leg  HPI: [name] is a 55 y.o. 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  The patient was in her usual state of health until last night at midnight when she woke up with violent vomiting. She felt sick, malaise, fever, "drunk" and weak at times and vomited NBNB emesis all day, until her husband came home in the afternoon and noticed that her right leg was red again, and so took her to the ER.  ED course: -Temperature 99.3 F, heart rate 130s, respirations 28, blood pressure normal, secondary well on room air -Na 129, K 3.9, Cr 1.12 (baseline 0.7), WBC 26.6K, Hgb 15, lactic acid 4.8 -Chest x-ray showed no convincing opacity, UA clear, there was spreading redness from the right calf -She received the 30 cc/kg bolus and cultures were obtained -She was started on vancomycin, aztreonam and Flagyl for sepsis unknown source, and oral vancomycin was added empirically because there was initial EDP concern for Cdiff colitis and [facility] were asked to accept for transger  In June, she had cellulitis twice, initially treated and improved with Bactrim from the ER, and the second time requiring hospitalization, got vancomycin and ceftriaxone and Flagyl transitioned to oral doxycycline and Flagyl and this resolved.        ROS: Review of Systems  Constitutional: Positive for chills, diaphoresis, fever and malaise/fatigue.  Respiratory: Negative for cough, hemoptysis, sputum production, shortness of breath and  wheezing.   Gastrointestinal: Positive for vomiting. Negative for abdominal pain, blood in stool, constipation, diarrhea and melena.  Genitourinary: Negative for dysuria, flank pain, frequency, hematuria and urgency.  Musculoskeletal: Positive for joint pain and myalgias.  Skin: Positive for rash.  All other systems reviewed and are negative.      Past Medical History:  Diagnosis Date  . Asthma   . C. difficile diarrhea   . CHF (congestive heart failure) (HCC) 09/2015   cath with normal LM, 30% LAD, 85% mid RCA and 95% distal RCA s/p PCI of the mid to distal RCA and now on DAPT with ASA and Ticagrelor.    . Chronic pain syndrome   . Coronary artery disease   . Diabetes mellitus   . Eczema   . Excessive daytime sleepiness 11/26/2015  . GERD (gastroesophageal reflux disease)   . Hyperlipidemia   . Hypertension   . IBS (irritable bowel syndrome)   . Mixed connective tissue disease (HCC)   . Thyroid disease     Past Surgical History:  Procedure Laterality Date  . ABDOMINAL HYSTERECTOMY    . APPENDECTOMY    . BREAST SURGERY    . CARDIAC CATHETERIZATION    . CHOLECYSTECTOMY    . CORONARY ANGIOPLASTY     normal LM, 30% LAD, 85% mid RCA and 95% distal RCA s/p PCI of the mid to distal RCA and now on DAPT with ASA and Ticagrelor.    . LEG SURGERY Left    muscle biospy  . WRIST SURGERY Left     Social History: Patient lives with  her husband.  The patient walks unassisted.  She is a former smoker.    Allergies  Allergen Reactions  . Fish Allergy Anaphylaxis  . Fish Oil [Omega-3 Fatty Acids] Swelling    Throat swelling   . Gabapentin Other (See Comments)    Anxiety on high doses  . Ipratropium Shortness Of Breath  . Ipratropium-Albuterol Other (See Comments) and Shortness Of Breath    Wheezing becomes worse, can take albuterol with ipratropium  . Red Yeast Rice [Cholestin] Other (See Comments)    Muscle pain and severe joint pain.   . Atrovent Other (See Comments)     Wheezing becomes worse  . Crestor [Rosuvastatin] Other (See Comments)    Extreme joint pain Extreme joint pain, to this & other statins  . Shellfish Allergy   . Suprep [Na Sulfate-K Sulfate-Mg Sulf] Nausea And Vomiting  . Tape     Electrodes causes skin breakdown  . Penicillins Rash and Other (See Comments)    Unknown childhood reaction; Tolerates Keflex.   To undergo PCN challenge with allergist 02/2016    Family history: family history includes Allergic rhinitis in her father; Asthma in her father; Breast cancer in her paternal aunt; CAD in her father and mother; Heart attack in her father and mother; Hypertension in her brother, brother, and mother; Pancreatic cancer in her paternal aunt.  Prior to Admission medications   Medication Sig Start Date End Date Taking? Authorizing Provider  albuterol (PROVENTIL HFA;VENTOLIN HFA) 108 (90 BASE) MCG/ACT inhaler Inhale 2 puffs into the lungs every 4 (four) hours as needed for wheezing or shortness of breath. 11/14/12   Richarda Overlie, MD  albuterol (PROVENTIL) (2.5 MG/3ML) 0.083% nebulizer solution Inhale 2.5 mLs into the lungs every 4 (four) hours as needed for wheezing or shortness of breath.  09/24/15   Historical Provider, MD  Alpha Lipoic Acid 200 MG CAPS Take 600 mg by mouth 3 (three) times daily.    Historical Provider, MD  aspirin EC 81 MG tablet Take 81 mg by mouth daily.    Historical Provider, MD  b complex vitamins tablet Take 1 tablet by mouth daily.    Historical Provider, MD  beclomethasone (QVAR) 40 MCG/ACT inhaler ONE PUFF TWICE A DAY TO PREVENT COUGH OR WHEEZE. USE SPACER. 02/10/16   Fletcher Anon, MD  betamethasone dipropionate (DIPROLENE) 0.05 % cream Apply 1 application topically daily as needed (eczema).    Historical Provider, MD  BRILINTA 90 MG TABS tablet Take 90 mg by mouth 2 (two) times daily. 11/25/15   Historical Provider, MD  cetirizine (ZYRTEC) 10 MG tablet Take 10 mg by mouth daily as needed for allergies.    Historical  Provider, MD  clotrimazole-betamethasone (LOTRISONE) cream Apply 1 application topically daily as needed (eczema).    Historical Provider, MD  diphenhydrAMINE (BENADRYL) 25 MG tablet Take 1 tablet (25 mg total) by mouth every 6 (six) hours as needed for itching or allergies. 04/05/13   Fayrene Helper, PA-C  enalapril (VASOTEC) 10 MG tablet Take 10 mg by mouth 2 (two) times daily.    Historical Provider, MD  EPINEPHrine (EPIPEN 2-PAK) 0.3 mg/0.3 mL IJ SOAJ injection USE AS DIRECTED FOR SEVERE ALLERGIC REACTION. 02/10/16   Fletcher Anon, MD  Estrogens, Conjugated (PREMARIN VA) Place 1 application vaginally 3 (three) times a week. On Saturdays, Mondays, and Wednesday (cream)    Historical Provider, MD  fluticasone (FLONASE) 50 MCG/ACT nasal spray Place 2 sprays into both nostrils daily as needed for allergies or  rhinitis. 03/28/16   Fletcher Anon, MD  furosemide (LASIX) 20 MG tablet Take 20 mg by mouth 3 (three) times daily as needed for fluid.    Historical Provider, MD  furosemide (LASIX) 40 MG tablet Take 40 mg by mouth daily as needed for fluid.     Historical Provider, MD  gabapentin (NEURONTIN) 100 MG capsule Take 400-700 mg by mouth See admin instructions. Pt takes 400mg  in the morning, 400mg  at lunchtime and 700mg  in the evening    Historical Provider, MD  GLUCOMANNAN PO Take 700 mg by mouth 2 (two) times daily.    Historical Provider, MD  Guaifenesin (MUCINEX MAXIMUM STRENGTH) 1200 MG TB12 Take 1,200 mg by mouth 2 (two) times daily.    Historical Provider, MD  hydroxychloroquine (PLAQUENIL) 200 MG tablet Take 200 mg by mouth 2 (two) times daily.    Historical Provider, MD  ibuprofen (ADVIL,MOTRIN) 400 MG tablet Take 1 tablet (400 mg total) by mouth every 6 (six) hours as needed. 02/04/16   Lyndal Pulley, MD  insulin regular human CONCENTRATED (HUMULIN R) 500 UNIT/ML SOLN injection Inject 0-100 Units into the skin 4 (four) times daily -  before meals and at bedtime. TID w/ meals Cbgs <125 = no  insulin          125-150 = 10 units using insulin syringe ( actual dose is 50 units)          151 - 200 = 15 units using insulin syringe ( actual dose is 75 units)          > 201 = 20 units using insulin syringe ( actual dose is 100 units)  QHS Cbgs <150 = no insulin          151-200= 5 units using insulin syringe ( actual dose is 25 units)          201 - 300 = 10 units using insulin syringe ( actual dose is 50 units)          > 300 = 15 units using insulin syringe ( actual dose is 75 units)    Historical Provider, MD  Insulin Syringe-Needle U-100 (EASY TOUCH INSULIN SYRINGE) 30G X 5/16" 0.5 ML MISC  01/26/16   Historical Provider, MD  levalbuterol (XOPENEX) 1.25 MG/0.5ML nebulizer solution Take 1 ampule by nebulization 4 (four) times daily as needed for wheezing or shortness of breath.    Historical Provider, MD  levothyroxine (SYNTHROID, LEVOTHROID) 200 MCG tablet Take 200 mcg by mouth See admin instructions. Take 225 mcg once daily on Monday-Friday. Take 200 mcg once daily on Saturday and Sunday.    Historical Provider, MD  levothyroxine (SYNTHROID, LEVOTHROID) 25 MCG tablet Take 25 mcg by mouth See admin instructions. Take 225 mcg once daily on Monday-Friday. Take 200 mcg once daily on Saturday and Sunday. 10/20/15   Historical Provider, MD  magnesium oxide (MAG-OX) 400 MG tablet Take 400 mg by mouth every evening.     Historical Provider, MD  metFORMIN (GLUCOPHAGE-XR) 500 MG 24 hr tablet Take 1,000 mg by mouth 2 (two) times daily. 11/25/15   Historical Provider, MD  metoprolol tartrate (LOPRESSOR) 25 MG tablet Take 25 mg by mouth 2 (two) times daily. 11/25/15   Historical Provider, MD  milk thistle 175 MG tablet Take 175 mg by mouth 2 (two) times daily.     Historical Provider, MD  Multiple Vitamin (MULTIVITAMIN) tablet Take 1 tablet by mouth daily.    Historical Provider, MD  ondansetron (ZOFRAN) 4 MG  tablet Take 1 tablet (4 mg total) by mouth every 8 (eight) hours as needed for nausea or  vomiting. 02/23/16   Leta Baptist, MD  Oxycodone HCl 10 MG TABS Take 10 mg by mouth every 4 (four) hours as needed (for pain).    Historical Provider, MD  oxymetazoline (AFRIN) 0.05 % nasal spray Place 1 spray into both nostrils 2 (two) times daily as needed for congestion.    Historical Provider, MD  Polyethyl Glycol-Propyl Glycol (SYSTANE ULTRA OP) Apply 1 drop to eye daily as needed (dry eyes).    Historical Provider, MD  promethazine (PHENERGAN) 25 MG tablet Take 25 mg by mouth every 6 (six) hours as needed for nausea or vomiting.    Historical Provider, MD  saccharomyces boulardii (FLORASTOR) 250 MG capsule Take 1 capsule (250 mg total) by mouth 2 (two) times daily. 02/13/15   Osvaldo Shipper, MD  vitamin A 16109 UNIT capsule Take 10,000 Units by mouth daily.    Historical Provider, MD  vitamin B-12 (CYANOCOBALAMIN) 1000 MCG tablet Take 1,000 mcg by mouth daily.      Historical Provider, MD  Vitamin D, Ergocalciferol, (DRISDOL) 50000 units CAPS capsule Take 1 capsule by mouth once a week. Monday. 11/25/15   Historical Provider, MD  Vitamins-Lipotropics (LIPO-FLAVONOID PLUS PO) Take 1 tablet by mouth 3 (three) times daily.    Historical Provider, MD       Physical Exam: BP 121/72 (BP Location: Right Arm)   Pulse 85   Temp 98.6 F (37 C) (Oral)   Resp 18   Ht 5\' 2"  (1.575 m)   Wt 120.2 kg (264 lb 14.4 oz)   SpO2 94%   BMI 48.45 kg/m  General appearance: Obese adult female, alert and in no acute distress.   Eyes: Anicteric, conjunctiva pink, lids and lashes normal.     ENT: No nasal deformity, discharge, or epistaxis.  OP moist without lesions.   Lymph: No cervical or supraclavicular lymphadenopathy. Skin: Warm and diaphoretic.  No jaundice.  On the right leg, there is blotchy coalescent erythematous and edematous erysipelas-like patches extending up from the calf, which is completely red.  These areas are tender without fluctuance. Cardiac: RRR, nl S1-S2, no murmurs appreciated.   Capillary refill is brisk.  JVP not visible.  No particular LE edema.  Radial pulses 2+ and symmetric.  DP pulses diminished. Respiratory: Normal respiratory rate and rhythm.  CTAB without rales or wheezes. GI: Abdomen soft without rigidity.  No focal TTP. Obese, no HSM appreciated. MSK: No deformities or effusions.  No clubbing/cyanosis. Neuro: Tired but sensorium intact and responding to questions, attention normal.  Speech is fluent.  Moves all extremities equally and with normal coordination.    Psych: Affect normal.  Judgment and insight appear normal.       Labs on Admission:  I have personally reviewed following labs and imaging studies: CBC:  Recent Labs Lab 04/28/16 1850  WBC 26.6*  NEUTROABS 22.9*  HGB 15.4*  HCT 45.9  MCV 84.8  PLT 234   Basic Metabolic Panel:  Recent Labs Lab 04/28/16 1850  NA 129*  K 3.9  CL 91*  CO2 23  GLUCOSE 308*  BUN 13  CREATININE 1.12*  CALCIUM 9.5   GFR: Estimated Creatinine Clearance: 70.8 mL/min (by C-G formula based on SCr of 1.12 mg/dL).  Liver Function Tests:  Recent Labs Lab 04/28/16 1850  AST 34  ALT 29  ALKPHOS 67  BILITOT 1.5*  PROT 7.9  ALBUMIN 4.0  CBG:  Recent Labs Lab 04/28/16 1850  GLUCAP 348*    Sepsis Labs: Lactic acid 4.85 --> 1.5   Radiological Exams on Admission: Personally reviewed: Dg Chest Port 1 View  Result Date: 04/28/2016 CLINICAL DATA:  Acute onset of vomiting and fever. Right leg swelling and pain. Initial encounter. EXAM: PORTABLE CHEST 1 VIEW COMPARISON:  Chest radiograph from 09/28/2015 FINDINGS: The lungs are well-aerated. Vascular congestion is noted. Mild left basilar opacity may reflect atelectasis or mild pneumonia. There is no evidence of pleural effusion or pneumothorax. The cardiomediastinal silhouette is within normal limits. No acute osseous abnormalities are seen. IMPRESSION: Vascular congestion noted. Mild left basilar opacity may reflect atelectasis or mild pneumonia.  Electronically Signed   By: Roanna Raider M.D.   On: 04/28/2016 19:38    EKG: Independently reviewed. Rate 126, QTc 446, sinus tachycardia.    Assessment/Plan 1. Sepsis:  Suspected source cellulitis again. Organism unknown. Patient meets criteria given tachycardia, tachypnea, leukocytosis, and evidence of organ dysfunction.  Lactate 4.8 mmol/L and repeat ordered within 6 hours.  This patient is not at high risk of poor outcomes with a SOFA score of 1.  Antibiotics delivered in the ED.    -Sepsis bundle utilized:  -Blood and urine cultures drawn  -30 ml/kg bolus given in ED, will repeat lactic acid  -Start targeted antibiotics with vancomycin, ceftriaxone and flagyl, based on suspected source of infection    -Repeat renal function and complete blood count in AM  -Code SEPSIS called to E-link  -Will stop oral vanc and precautions given no reported history of diarrhea  -Continue Florastor    2. Elevated creatinine:  Likely contraction in setting of sepsis. -Trend BMP -Hold enalapril  3. Hyponatremia:  Hypovolemic. -Trend BMP  4. IDDM with hyperglycemia:  HgbA1c 7.7% in June.   -Continue home insulin U500 sliding scale with meals and at bedtime -Hold metformin  5. Hyperbilirubinemia:  Suspect this is stress related. -Check fractionated bilirubin -Trend CMP  6. Hypothyroidism:  -Continue home levothyroxine  7. HTN and hx of CAD with DES in Jan 2017:  Is statin intolerant.  -Continue home ticagrelor and aspirin -Hold enalapril and metoprolol until hemodynamics clearer -Hold furosemide, which she uses PRN  8. MCTD: -Continue Plaquenil  9. Asthma: -Continue Pulmicort -Continue guiafenesin  10. Chronic pain: -Continue fentanyl 100 mcg patch q48hrs -Continue oxycodone PRN for breakthrough -Continue gabapentin        DVT prophylaxis: Lovenox  Code Status: FULL  Family Communication: None present  Disposition Plan: Anticipate IV fluids and antibiotics,  transition to oral antibiotics when clinically stable. Consults called: None Admission status: INPATIENT, stepdown    Medical decision making: Patient seen at 12:39 AM on 04/29/2016.  The patient was discussed with Dr. Jacqulyn Bath at [facility]. What exists of the patient's chart and outside records in CareEverywhere was reviewed in depth.  Clinical condition: requiring close hemodynamic monitoring overnight.        Alberteen Sam [facility] Pager [phone]

## 2016-04-29 NOTE — Progress Notes (Addendum)
[name] TEAM 1 - Stepdown/ICU TEAM  [name]  KWI:[id number] DOB: March 16, 1961 DOA: 04/28/2016 PCP: [name] Bay, MD    Brief Narrative:  55 y.o. 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  The patient was in her usual state of health until she woke at midnight with violent vomiting. She felt sick, malaise, fever, "drunk" and weak at times and vomited NBNB emesis all day, until her husband came home in the afternoon and noticed that her right leg was red again, and took her to the ER.  In the ED she had a temperature of 99.3 F, heart rate 130s, respirations 28, blood pressure normal, Cr 1.12 (baseline 0.7), WBC 26.6K, Hgb 15, lactic acid 4.8.  Chest x-ray showed no convincing opacity, UA clear.  There was spreading redness from the right calf.  In June she had cellulitis twice, initially treated and improved with Bactrim from the ER, and the second time requiring hospitalization w/ vancomycin and ceftriaxone and Flagyl transitioned to oral doxycycline and Flagyl > resolved.  Subjective: Pt is seen for a f/u visit.    Assessment & Plan:  Sepsis due to cellulitis Organism unknown - sepsis physiology has improved/pt is hemodynamically stable    Elevated creatinine  Hyponatremia  DM with hyperglycemia  Hyperbilirubinemia  Hypothyroidism   HTN  Hx of CAD with DES in Jan 2017 statin intolerant - continue home ticagrelor and aspirin  MCTD continue Plaquenil  Asthma continue Pulmicort  Chronic pain continue fentanyl 100 mcg patch   MRSA screen +  DVT prophylaxis: lovenox  Code Status: FULL CODE Family Communication: no family present at time of exam  Disposition Plan: stable for transfer to medical bed   Consultants:   none  Procedures: none  Antimicrobials:  Aztreonam 8/18 Flagyl 8/18 > Vancomycin 8/18 > Ceftriaxone 8/19 >  Objective: Blood pressure 138/80, pulse 83, temperature 97.9 F (36.6 C), temperature source Oral, resp. rate (!) 28, height 5\' 2"  (1.575 m), weight 120 kg (264 lb 8.8 oz), SpO2 99 %.  Intake/Output Summary (Last 24 hours) at 04/29/16 1453 Last data filed at 04/29/16 1300  Gross per 24 hour  Intake             2105 ml  Output             1100 ml  Net             1005 ml   Filed Weights   04/28/16 1833 04/28/16 2349 04/29/16 0354  Weight: 116.6 kg (257 lb) 120.2 kg (264 lb 14.4 oz) 120 kg (264 lb 8.8 oz)    Examination: Pt was seen for a f/u visit.    CBC:  Recent Labs Lab 04/28/16 1850 04/29/16 0250  WBC 26.6* 15.1*  NEUTROABS 22.9*  --   HGB 15.4* 12.1  HCT 45.9 38.5  MCV 84.8 87.9  PLT 234 160   Basic Metabolic Panel:  Recent Labs Lab 04/28/16 1850 04/29/16 0250  NA 129* 134*  K 3.9 3.9  CL 91* 105  CO2 23 22  GLUCOSE 308* 247*  BUN 13 9  CREATININE 1.12* 0.77  CALCIUM 9.5 8.3*   GFR: Estimated Creatinine Clearance: 99.1 mL/min (by C-G formula based on SCr of 0.8 mg/dL).  Liver Function Tests:  Recent Labs Lab 04/28/16  1850 04/29/16 0250  AST 34 20  ALT 29 22  ALKPHOS 67 49  BILITOT 1.5* 0.9  0.9  PROT 7.9 5.9*  ALBUMIN 4.0 2.8*    HbA1C: Hgb A1c MFr Bld  Date/Time Value Ref Range Status  03/09/2016 07:19 PM 7.7 (H) 4.8 - 5.6 % Final    Comment:    (NOTE)         Pre-diabetes: 5.7 - 6.4         Diabetes: >6.4         Glycemic control for adults with diabetes: <7.0   02/11/2015 03:10 AM 8.0 (H) 4.8 - 5.6 % Final    Comment:    (NOTE)         Pre-diabetes: 5.7 - 6.4         Diabetes: >6.4         Glycemic control for adults with diabetes: <7.0     CBG:  Recent Labs Lab 04/28/16 1850 04/29/16 0203 04/29/16 0737 04/29/16 1219  GLUCAP 348* 241* 184* 147*    Recent Results (from the past 240 hour(s))   Culture, blood (Routine x 2)     Status: None (Preliminary result)   Collection Time: 04/28/16  6:50 PM  Result Value Ref Range Status   Specimen Description BLOOD LEFT ANTECUBITAL  Final   Special Requests   Final    BOTTLES DRAWN AEROBIC AND ANAEROBIC 5CC BOTH BOTTLES   Culture   Final    NO GROWTH < 24 HOURS Performed at [facility]    Report Status PENDING  Incomplete  Culture, blood (Routine x 2)     Status: None (Preliminary result)   Collection Time: 04/28/16  7:10 PM  Result Value Ref Range Status   Specimen Description BLOOD RIGHT HAND  Final   Special Requests IN PEDIATRIC BOTTLE 1CC  Final   Culture   Final    NO GROWTH < 24 HOURS Performed at [facility], [place]    Report Status PENDING  Incomplete  MRSA PCR Screening     Status: Abnormal   Collection Time: 04/28/16 11:24 PM  Result Value Ref Range Status   MRSA by PCR POSITIVE (A) NEGATIVE Final    Comment:        The GeneXpert MRSA Assay (FDA approved for NASAL specimens only), is one component of a comprehensive MRSA colonization surveillance program. It is not intended to diagnose MRSA infection nor to guide or monitor treatment for MRSA infections. RESULT CALLED TO, READ BACK BY AND VERIFIED WITH: THOMLINSON,RN @0459  04/29/16 MKELLY      Scheduled Meds: . aspirin EC  81 mg Oral Daily  . budesonide (PULMICORT) nebulizer solution  0.25 mg Nebulization BID  . cefTRIAXone (ROCEPHIN)  IV  2 g Intravenous Q24H  . [START ON 04/30/2016] enoxaparin (LOVENOX) injection  60 mg Subcutaneous Daily  . fentaNYL  100 mcg Transdermal Q48H  . gabapentin  400 mg Oral BID   And  . gabapentin  700 mg Oral QHS  . guaiFENesin  1,200 mg Oral BID  . hydroxychloroquine  200 mg Oral BID  . insulin regular human CONCENTRATED  0-100 Units Subcutaneous TID WC   And  . insulin regular human CONCENTRATED  0-75 Units Subcutaneous QHS  . [START ON 05/01/2016] levothyroxine  225 mcg Oral Once per day on Mon Tue Wed Thu Fri    And  . levothyroxine  200 mcg Oral Once per day on Sun Sat  . metronidazole  500 mg  Intravenous Q8H  . saccharomyces boulardii  250 mg Oral BID  . ticagrelor  90 mg Oral BID  . vancomycin  1,000 mg Intravenous Once  . vancomycin  750 mg Intravenous Q12H     LOS: 1 day   Time spent: No Charge  Lonia Blood, MD [facility] Office  [phone] Pager - Text Page per Amion as per below:  On-Call/Text Page:      [email or web address]      password [email or web address]  If 7PM-7AM, please contact night-coverage [email or web address] Password [facility] 04/29/2016, 2:53 PM

## 2016-04-30 ENCOUNTER — Inpatient Hospital Stay (HOSPITAL_COMMUNITY): Payer: BLUE CROSS/BLUE SHIELD

## 2016-04-30 DIAGNOSIS — R600 Localized edema: Secondary | ICD-10-CM

## 2016-04-30 DIAGNOSIS — M79609 Pain in unspecified limb: Secondary | ICD-10-CM

## 2016-04-30 DIAGNOSIS — M7989 Other specified soft tissue disorders: Secondary | ICD-10-CM

## 2016-04-30 LAB — CBC
HCT: 38 % (ref 36.0–46.0)
Hemoglobin: 11.9 g/dL — ABNORMAL LOW (ref 12.0–15.0)
MCH: 27.7 pg (ref 26.0–34.0)
MCHC: 31.3 g/dL (ref 30.0–36.0)
MCV: 88.4 fL (ref 78.0–100.0)
Platelets: 166 10*3/uL (ref 150–400)
RBC: 4.3 MIL/uL (ref 3.87–5.11)
RDW: 14.1 % (ref 11.5–15.5)
WBC: 14.2 10*3/uL — ABNORMAL HIGH (ref 4.0–10.5)

## 2016-04-30 LAB — GLUCOSE, CAPILLARY
Glucose-Capillary: 129 mg/dL — ABNORMAL HIGH (ref 65–99)
Glucose-Capillary: 192 mg/dL — ABNORMAL HIGH (ref 65–99)
Glucose-Capillary: 151 mg/dL — ABNORMAL HIGH (ref 65–99)
Glucose-Capillary: 134 mg/dL — ABNORMAL HIGH (ref 65–99)

## 2016-04-30 LAB — COMPREHENSIVE METABOLIC PANEL
ALT: 26 U/L (ref 14–54)
AST: 24 U/L (ref 15–41)
Albumin: 2.7 g/dL — ABNORMAL LOW (ref 3.5–5.0)
Alkaline Phosphatase: 49 U/L (ref 38–126)
Anion gap: 11 (ref 5–15)
BUN: 10 mg/dL (ref 6–20)
CO2: 24 mmol/L (ref 22–32)
Calcium: 8.9 mg/dL (ref 8.9–10.3)
Chloride: 104 mmol/L (ref 101–111)
Creatinine, Ser: 0.72 mg/dL (ref 0.44–1.00)
GFR calc Af Amer: >60 mL/min (ref >60)
GFR calc non Af Amer: >60 mL/min (ref >60)
Glucose, Bld: 122 mg/dL — ABNORMAL HIGH (ref 65–99)
Potassium: 3.6 mmol/L (ref 3.5–5.1)
Sodium: 139 mmol/L (ref 135–145)
Total Bilirubin: 0.4 mg/dL (ref 0.3–1.2)
Total Protein: 5.9 g/dL — ABNORMAL LOW (ref 6.5–8.1)

## 2016-04-30 LAB — URINE CULTURE

## 2016-04-30 MED ORDER — ALBUTEROL SULFATE (2.5 MG/3ML) 0.083% IN NEBU
2.5000 mg | INHALATION_SOLUTION | Freq: Four times a day (QID) | RESPIRATORY_TRACT | Status: DC | PRN
Start: 2016-04-30 — End: 2016-05-04
  Administered 2016-05-01 – 2016-05-03 (×4): 2.5 mg via RESPIRATORY_TRACT
  Filled 2016-04-30 (×4): qty 3

## 2016-04-30 MED ORDER — ALBUTEROL SULFATE (2.5 MG/3ML) 0.083% IN NEBU
INHALATION_SOLUTION | RESPIRATORY_TRACT | Status: AC
  Administered 2016-04-30: 2.5 mg
  Filled 2016-04-30: qty 3

## 2016-04-30 NOTE — Progress Notes (Signed)
VASCULAR LAB PRELIMINARY  PRELIMINARY  PRELIMINARY  PRELIMINARY  Right lower extremity venous duplex completed.    Preliminary report:  There is no DVT or SVT noted in the right lower extremity.   Cruzito Standre, RVT 04/30/2016, 4:37 PM

## 2016-04-30 NOTE — Progress Notes (Signed)
Patient arrived on unit in a wheelchair, accompanied by RN and NT. She is alert, verbally oriented and can make needs known. On assessment, reddened areas were noted to under her bilateral breast, abdominal fold and perineal area. Bruises were also noted to her abdominal fold and sacral area. A pink foam has being placed on her sacral area for protection. Left lower extremity is erythermic and warmer compared to right lower extremity. Patient indicated that, she is diabetic and has a humulin syringe. Syringe has been placed in [facility] in patients daily medication box.

## 2016-04-30 NOTE — Progress Notes (Signed)
Patient ID: [name], female   DOB: 19-Apr-1961, 55 y.o.   MRN: [id number]                                                                PROGRESS NOTE                                                                                                                                                                                                             Patient Demographics:    [name], is a 55 y.o. female, DOB - 28-Jul-1961, CVE:[id number]  Admit date - 04/28/2016   Admitting Physician Alberteen Sam, MD  Outpatient Primary MD for the patient is Cheral Bay, MD  LOS - 2  Outpatient Specialists:     Chief Complaint  Patient presents with  . 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  The patient wasin her usual state of health until she woke at midnight with violent vomiting. She felt sick, malaise, fever, "drunk" and weak at times and vomited NBNB emesis all day, until her husband came home in the afternoon and noticed that her right leg was red again, and took her to the ER.  In the ED she had a temperature of 99.3 F, heart rate 130s, respirations 28, blood pressure normal, Cr 1.12(baseline 0.7), WBC 26.6K, Hgb 15, lactic acid 4.8.  Chest x-ray showed no convincing opacity, UA clear.  There was spreading redness from the right calf.  In June she had cellulitis twice, initially treated and improved with Bactrim from the ER, and the second time requiring hospitalization w/ vancomycin and ceftriaxone and Flagyl transitioned to oral doxycycline and Flagyl > resolved.   Subjective:    [name] today has slight increase in right distal lower ext swelling per pt.  Pt notes that redness from cellulitis is improving.     No headache, No chest pain, No abdominal pain - No Nausea, No new weakness tingling or numbness, No Cough - SOB.    Assessment  & Plan :  Principal Problem:   Sepsis (HCC) Active Problems:   Diabetes mellitus without complication (HCC)   Acquired hypothyroidism   Benign essential HTN   Chronic pain associated with significant psychosocial dysfunction   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HCC)   CAD in native artery   Mild persistent asthma   Cellulitis of right leg   Hyperbilirubinemia   Hyponatremia   Sepsis due to cellulitis Organism unknown - sepsis physiology has improved/pt is hemodynamically stable   Imipenem 8/18 Cont vanco iv pharmacy to dose 8/18=> Cont Rocephin iv 8/18=>  Edema Check right lower ext u/s r/o DVT  C. DIff positive  Cont flagyl 500mg  iv tid 8/18=>  Elevated creatinine Check cmp in am  Hyponatremia resolved  DM with hyperglycemia Cont fsbs ac and qhs, iss  Hyperbilirubinemia stable Hypothyroidism Cont levothyroxine  HTN stable  Hx of CAD with DES in Jan 2017 statin intolerant - continue home ticagrelor and aspirin Consider newer agent such as zetia  Or Psk9 inhibitor   MCTD continue Plaquenil  Asthma continue Pulmicort  Chronic pain continue fentanyl 100 mcg patch   MRSA screen +    Code Status : FULL CODE  Family Communication  : no family present  Disposition Plan  :  Home eventually  Barriers For Discharge :   Consults  :    Procedures  :   CXR 8/18=> Vascular congestion noted. Mild left basilar opacity may reflect atelectasis or mild pneumonia.  DVT Prophylaxis  :  Lovenox - SCDs   Lab Results  Component Value Date   PLT 166 04/30/2016    Antibiotics  :    Anti-infectives    Start     Dose/Rate Route Frequency Ordered Stop   04/29/16 2230  vancomycin (VANCOCIN) IVPB 750 mg/150 ml premix     750 mg 150 mL/hr over 60 Minutes Intravenous Every 12 hours 04/29/16 2228     04/29/16  0800  cefTRIAXone (ROCEPHIN) 2 g in dextrose 5 % 50 mL IVPB     2 g 100 mL/hr over 30 Minutes Intravenous Every 24 hours 04/29/16 0125     04/29/16 0600  vancomycin (VANCOCIN) IVPB 750 mg/150 ml premix  Status:  Discontinued     750 mg 150 mL/hr over 60 Minutes Intravenous Every 12 hours 04/28/16 1939 04/29/16 2228   04/29/16 0400  aztreonam (AZACTAM) 2 g in dextrose 5 % 50 mL IVPB  Status:  Discontinued     2 g 100 mL/hr over 30 Minutes Intravenous Every 8 hours 04/28/16 2141 04/29/16 0125   04/29/16 0400  metroNIDAZOLE (FLAGYL) IVPB 500 mg     500 mg 100 mL/hr over 60 Minutes Intravenous Every 8 hours 04/28/16 2141     04/29/16 0200  hydroxychloroquine (PLAQUENIL) tablet 200 mg     200 mg Oral 2 times daily 04/29/16 0125     04/28/16 2230  ciprofloxacin (CIPRO) tablet 500 mg  Status:  Discontinued     500 mg Oral  Once 04/28/16 2216 04/28/16 2217   04/28/16 2230  doxycycline (VIBRA-TABS) tablet 100 mg  Status:  Discontinued     100 mg Oral  Once 04/28/16 2216 04/28/16 2217   04/28/16 2200  aztreonam (AZACTAM) 2 g in dextrose 5 % 50 mL IVPB  Status:  Discontinued     2 g 100 mL/hr over 30 Minutes Intravenous Every 8 hours 04/28/16 1939 04/28/16 2141   04/28/16 2200  metroNIDAZOLE (FLAGYL) IVPB 500 mg  Status:  Discontinued  500 mg 100 mL/hr over 60 Minutes Intravenous Every 8 hours 04/28/16 1941 04/28/16 2141   04/28/16 2200  vancomycin (VANCOCIN) 50 mg/mL oral solution 125 mg  Status:  Discontinued     125 mg Oral 4 times daily 04/28/16 2035 04/29/16 0125   04/28/16 1945  vancomycin (VANCOCIN) IVPB 1000 mg/200 mL premix     1,000 mg 200 mL/hr over 60 Minutes Intravenous  Once 04/28/16 1939 04/28/16 2136   04/28/16 1945  metroNIDAZOLE (FLAGYL) IVPB 500 mg  Status:  Discontinued     500 mg 100 mL/hr over 60 Minutes Intravenous Every 8 hours 04/28/16 1939 04/28/16 1941   04/28/16 1915  aztreonam (AZACTAM) 2 g in dextrose 5 % 50 mL IVPB     2 g 100 mL/hr over 30 Minutes Intravenous   Once 04/28/16 1903 04/28/16 2015   04/28/16 1915  metroNIDAZOLE (FLAGYL) IVPB 500 mg     500 mg 100 mL/hr over 60 Minutes Intravenous  Once 04/28/16 1903 04/28/16 2213   04/28/16 1915  vancomycin (VANCOCIN) IVPB 1000 mg/200 mL premix     1,000 mg 200 mL/hr over 60 Minutes Intravenous  Once 04/28/16 1903     04/28/16 1908  aztreonam (AZACTAM) 1 g injection    Comments:  Simms, Marva   : cabinet override      04/28/16 1908 04/28/16 1951        Objective:   Vitals:   04/29/16 2000 04/29/16 2157 04/29/16 2339 04/30/16 0506  BP: 136/75  125/67 (!) 146/71  Pulse: 97   76  Resp: 20     Temp:   98.6 F (37 C) 98.4 F (36.9 C)  TempSrc:   Oral Oral  SpO2: 94% 98% 96% 98%  Weight:      Height:        Wt Readings from Last 3 Encounters:  04/29/16 120 kg (264 lb 8.8 oz)  03/10/16 124.9 kg (275 lb 5.7 oz)  02/23/16 122.5 kg (270 lb)     Intake/Output Summary (Last 24 hours) at 04/30/16 0926 Last data filed at 04/29/16 2000  Gross per 24 hour  Intake             1730 ml  Output             2275 ml  Net             -545 ml     Physical Exam  Awake Alert, Oriented X 3, No new F.N deficits, Normal affect Julian.AT,PERRAL Supple Neck,No JVD, No cervical lymphadenopathy appriciated.  Symmetrical Chest wall movement, Good air movement bilaterally, CTAB RRR,No Gallops,Rubs or new Murmurs, No Parasternal Heave +ve B.Sounds, Abd Soft, No tenderness, No organomegaly appriciated, No rebound - guarding or rigidity. No Cyanosis, Clubbing.  1+ edema right distal lower ext buises on the right and left side of abdomen where had lovenox injection Redness improved.  Extends from the medial groin to the medial aspect of the calf.    Data Review:    CBC  Recent Labs Lab 04/28/16 1850 04/29/16 0250 04/30/16 0309  WBC 26.6* 15.1* 14.2*  HGB 15.4* 12.1 11.9*  HCT 45.9 38.5 38.0  PLT 234 160 166  MCV 84.8 87.9 88.4  MCH 28.5 27.6 27.7  MCHC 33.6 31.4 31.3  RDW 14.4 14.2 14.1    LYMPHSABS 1.6  --   --   MONOABS 2.1*  --   --   EOSABS 0.0  --   --   BASOSABS 0.0  --   --  Chemistries   Recent Labs Lab 04/28/16 1850 04/29/16 0250 04/30/16 0309  NA 129* 134* 139  K 3.9 3.9 3.6  CL 91* 105 104  CO2 23 22 24   GLUCOSE 308* 247* 122*  BUN 13 9 10   CREATININE 1.12* 0.77 0.72  CALCIUM 9.5 8.3* 8.9  AST 34 20 24  ALT 29 22 26   ALKPHOS 67 49 49  BILITOT 1.5* 0.9  0.9 0.4   ------------------------------------------------------------------------------------------------------------------ No results for input(s): CHOL, HDL, LDLCALC, TRIG, CHOLHDL, LDLDIRECT in the last 72 hours.  Lab Results  Component Value Date   HGBA1C 7.7 (H) 03/09/2016   ------------------------------------------------------------------------------------------------------------------ No results for input(s): TSH, T4TOTAL, T3FREE, THYROIDAB in the last 72 hours.  Invalid input(s): FREET3 ------------------------------------------------------------------------------------------------------------------ No results for input(s): VITAMINB12, FOLATE, FERRITIN, TIBC, IRON, RETICCTPCT in the last 72 hours.  Coagulation profile No results for input(s): INR, PROTIME in the last 168 hours.  No results for input(s): DDIMER in the last 72 hours.  Cardiac Enzymes No results for input(s): CKMB, TROPONINI, MYOGLOBIN in the last 168 hours.  Invalid input(s): CK ------------------------------------------------------------------------------------------------------------------    Component Value Date/Time   BNP 62.6 02/11/2015 0310    Inpatient Medications  Scheduled Meds: . aspirin EC  81 mg Oral Daily  . budesonide (PULMICORT) nebulizer solution  0.25 mg Nebulization BID  . cefTRIAXone (ROCEPHIN)  IV  2 g Intravenous Q24H  . enoxaparin (LOVENOX) injection  60 mg Subcutaneous Daily  . fentaNYL  100 mcg Transdermal Q48H  . gabapentin  400 mg Oral BID   And  . gabapentin  700 mg Oral  QHS  . guaiFENesin  1,200 mg Oral BID  . hydroxychloroquine  200 mg Oral BID  . insulin regular human CONCENTRATED  0-100 Units Subcutaneous TID WC   And  . insulin regular human CONCENTRATED  0-75 Units Subcutaneous QHS  . [START ON 05/01/2016] levothyroxine  225 mcg Oral Once per day on Mon Tue Wed Thu Fri   And  . levothyroxine  200 mcg Oral Once per day on Sun Sat  . metronidazole  500 mg Intravenous Q8H  . saccharomyces boulardii  250 mg Oral BID  . ticagrelor  90 mg Oral BID  . vancomycin  1,000 mg Intravenous Once  . vancomycin  750 mg Intravenous Q12H   Continuous Infusions: . sodium chloride     PRN Meds:.acetaminophen **OR** acetaminophen, ondansetron **OR** ondansetron (ZOFRAN) IV, oxyCODONE  Micro Results Recent Results (from the past 240 hour(s))  Culture, blood (Routine x 2)     Status: None (Preliminary result)   Collection Time: 04/28/16  6:50 PM  Result Value Ref Range Status   Specimen Description BLOOD LEFT ANTECUBITAL  Final   Special Requests   Final    BOTTLES DRAWN AEROBIC AND ANAEROBIC 5CC BOTH BOTTLES   Culture   Final    NO GROWTH < 24 HOURS Performed at [facility]    Report Status PENDING  Incomplete  Culture, blood (Routine x 2)     Status: None (Preliminary result)   Collection Time: 04/28/16  7:10 PM  Result Value Ref Range Status   Specimen Description BLOOD RIGHT HAND  Final   Special Requests IN PEDIATRIC BOTTLE 1CC  Final   Culture   Final    NO GROWTH < 24 HOURS Performed at [facility]    Report Status PENDING  Incomplete  MRSA PCR Screening     Status: Abnormal   Collection Time: 04/28/16 11:24 PM  Result Value Ref  Range Status   MRSA by PCR POSITIVE (A) NEGATIVE Final    Comment:        The GeneXpert MRSA Assay (FDA approved for NASAL specimens only), is one component of a comprehensive MRSA colonization surveillance program. It is not intended to diagnose MRSA infection nor to guide or monitor treatment  for MRSA infections. RESULT CALLED TO, READ BACK BY AND VERIFIED WITH: THOMLINSON,RN @0459  04/29/16 MKELLY   C difficile quick scan w PCR reflex     Status: Abnormal   Collection Time: 04/29/16  2:37 PM  Result Value Ref Range Status   C Diff antigen POSITIVE (A) NEGATIVE Final   C Diff toxin NEGATIVE NEGATIVE Final   C Diff interpretation Results are indeterminate. See PCR results.  Final  Clostridium Difficile by PCR     Status: Abnormal   Collection Time: 04/29/16  2:37 PM  Result Value Ref Range Status   Toxigenic C Difficile by pcr POSITIVE (A) NEGATIVE Final    Radiology Reports Dg Chest Port 1 View  Result Date: 04/28/2016 CLINICAL DATA:  Acute onset of vomiting and fever. Right leg swelling and pain. Initial encounter. EXAM: PORTABLE CHEST 1 VIEW COMPARISON:  Chest radiograph from 09/28/2015 FINDINGS: The lungs are well-aerated. Vascular congestion is noted. Mild left basilar opacity may reflect atelectasis or mild pneumonia. There is no evidence of pleural effusion or pneumothorax. The cardiomediastinal silhouette is within normal limits. No acute osseous abnormalities are seen. IMPRESSION: Vascular congestion noted. Mild left basilar opacity may reflect atelectasis or mild pneumonia. Electronically Signed   By: 09/30/2015 M.D.   On: 04/28/2016 19:38    Time Spent in minutes  30   04/30/2016 M.D on 04/30/2016 at 9:26 AM  Between 7am to 7pm - Pager - [phone]  After 7pm go to [email or web address] - password [facility]  [facility] -  Office  [phone]

## 2016-05-01 ENCOUNTER — Inpatient Hospital Stay (HOSPITAL_COMMUNITY): Payer: BLUE CROSS/BLUE SHIELD

## 2016-05-01 LAB — URINE CULTURE: Culture: NO GROWTH

## 2016-05-01 LAB — GLUCOSE, CAPILLARY
Glucose-Capillary: 164 mg/dL — ABNORMAL HIGH (ref 65–99)
Glucose-Capillary: 216 mg/dL — ABNORMAL HIGH (ref 65–99)
Glucose-Capillary: 106 mg/dL — ABNORMAL HIGH (ref 65–99)
Glucose-Capillary: 84 mg/dL (ref 65–99)

## 2016-05-01 LAB — VANCOMYCIN, TROUGH: Vancomycin Tr: 8 ug/mL — ABNORMAL LOW (ref 15–20)

## 2016-05-01 MED ORDER — METRONIDAZOLE 500 MG PO TABS: 500.0000 mg | ORAL_TABLET | Freq: Three times a day (TID) | ORAL | Status: DC

## 2016-05-01 MED ORDER — LEVOTHYROXINE SODIUM 100 MCG PO TABS
200.0000 ug | ORAL_TABLET | Freq: Every day | ORAL | Status: DC
  Administered 2016-05-01 – 2016-05-04 (×4): 200 ug via ORAL
  Filled 2016-05-01 (×4): qty 2

## 2016-05-01 MED ORDER — MUPIROCIN 2 % EX OINT
1.0000 "application " | TOPICAL_OINTMENT | Freq: Two times a day (BID) | CUTANEOUS | Status: DC
  Administered 2016-05-01 – 2016-05-04 (×7): 1 via NASAL
  Filled 2016-05-01: qty 22

## 2016-05-01 MED ORDER — VANCOMYCIN HCL 10 G IV SOLR
1250.0000 mg | Freq: Two times a day (BID) | INTRAVENOUS | Status: DC
  Administered 2016-05-01: 1250 mg via INTRAVENOUS
  Filled 2016-05-01 (×2): qty 1250

## 2016-05-01 MED ORDER — METRONIDAZOLE 500 MG PO TABS
500.0000 mg | ORAL_TABLET | Freq: Three times a day (TID) | ORAL | Status: DC
  Administered 2016-05-01 – 2016-05-04 (×9): 500 mg via ORAL
  Filled 2016-05-01 (×9): qty 1

## 2016-05-01 MED ORDER — CHLORHEXIDINE GLUCONATE CLOTH 2 % EX PADS
6.0000 | MEDICATED_PAD | Freq: Every day | CUTANEOUS | Status: DC
  Administered 2016-05-02 – 2016-05-04 (×3): 6 via TOPICAL

## 2016-05-01 NOTE — Progress Notes (Signed)
Patient ID: [name], female   DOB: 1961/03/18, 55 y.o.   MRN: [id number]                                                                PROGRESS NOTE                                                                                                                                                                                                             Patient Demographics:    [name], is a 55 y.o. female, DOB - 01-06-1961, [id number]  Admit date - 04/28/2016   Admitting Physician Alberteen Sam, MD  Outpatient Primary MD for the patient is Cheral Bay, MD  LOS - 3  Outpatient Specialists:     Chief Complaint  Patient presents with  . 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  The patient wasin her usual state of health until she woke at midnight with violent vomiting. She felt sick, malaise, fever, "drunk" and weak at times and vomited NBNB emesis all day, until her husband came home in the afternoon and noticed that her right leg was red again, and took her to the ER.  In the ED she had a temperature of 99.3 F, heart rate 130s, respirations 28, blood pressure normal, Cr 1.12(baseline 0.7), WBC 26.6K, Hgb 15, lactic acid 4.8.  Chest x-ray showed no convincing opacity, UA clear.  There was spreading redness from the right calf.  In June she had cellulitis twice, initially treated and improved with Bactrim from the ER, and the second time requiring hospitalization with IV antibiotics.    Subjective:    [name] today has slight increase in right distal lower ext swelling per pt.  Pt notes that redness from cellulitis is improving.    No headache, No chest pain, No abdominal pain - No Nausea, No new  weakness tingling or numbness, No Cough - SOB.    Assessment  & Plan :    Principal Problem:   Sepsis (HCC) Active Problems:  Diabetes mellitus without complication (HCC)   Acquired hypothyroidism   Benign essential HTN   Chronic pain associated with significant psychosocial dysfunction   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HCC)   CAD in native artery   Mild persistent asthma   Cellulitis of right leg   Hyperbilirubinemia   Hyponatremia   Edema   Sepsis due to  Right lower extremity cellulitis Organism unknown - sepsis physiology has improved/pt is hemodynamically stable   Afebrile and leukocytosis is improving.  She was started on IV vancomycin and iv rocephin.as she is MRSA positive, we will d/c IV rocephin and continue with IV vancomycin.  Ambulate, elevate the leg.  Improving.  She was found to have bruise on the foot of the right leg, get  Xray o the right foot.    Right lower extremity swelling: Duplex is negative for DVT.   C. DIff  Antigen positive  Started on PO flagyl started from 8/18 Day 4 of flagyl, complete 14 day course.   Acute renal failure: probably pre renal in origin.  Resolved with hydration.   Hyponatremia resolved  DM with hyperglycemia Cont fsbs ac and qhs, iss  Hyperbilirubinemia stable Hypothyroidism Cont levothyroxine  HTN Well controlled.  stable  Hx of CAD with DES in Jan 2017 statin intolerant - continue home ticagrelor and aspirin   MCTD continue Plaquenil  Asthma No wheezing heard.  continue Pulmicort  Chronic pain continue fentanyl 100 mcg patch   MRSA screen +    Code Status : FULL CODE  Family Communication  : no family present  Disposition Plan  :  Home eventually  Barriers For Discharge :   Consults  :  none  Procedures  :   CXR 8/18=> Vascular congestion noted. Mild left basilar opacity may reflect atelectasis or mild pneumonia.  DVT Prophylaxis  :  Lovenox - SCDs   Lab  Results  Component Value Date   PLT 166 04/30/2016    Antibiotics  :    Anti-infectives    Start     Dose/Rate Route Frequency Ordered Stop   05/01/16 1130  vancomycin (VANCOCIN) 1,250 mg in sodium chloride 0.9 % 250 mL IVPB     1,250 mg 166.7 mL/hr over 90 Minutes Intravenous Every 12 hours 05/01/16 1104     04/29/16 2230  vancomycin (VANCOCIN) IVPB 750 mg/150 ml premix  Status:  Discontinued     750 mg 150 mL/hr over 60 Minutes Intravenous Every 12 hours 04/29/16 2228 05/01/16 1104   04/29/16 0800  cefTRIAXone (ROCEPHIN) 2 g in dextrose 5 % 50 mL IVPB     2 g 100 mL/hr over 30 Minutes Intravenous Every 24 hours 04/29/16 0125     04/29/16 0600  vancomycin (VANCOCIN) IVPB 750 mg/150 ml premix  Status:  Discontinued     750 mg 150 mL/hr over 60 Minutes Intravenous Every 12 hours 04/28/16 1939 04/29/16 2228   04/29/16 0400  aztreonam (AZACTAM) 2 g in dextrose 5 % 50 mL IVPB  Status:  Discontinued     2 g 100 mL/hr over 30 Minutes Intravenous Every 8 hours 04/28/16 2141 04/29/16 0125   04/29/16 0400  metroNIDAZOLE (FLAGYL) IVPB 500 mg     500 mg 100 mL/hr over 60 Minutes Intravenous Every 8 hours 04/28/16 2141     04/29/16 0200  hydroxychloroquine (PLAQUENIL) tablet 200 mg     200 mg Oral 2 times daily 04/29/16 0125     04/28/16 2230  ciprofloxacin (CIPRO) tablet 500 mg  Status:  Discontinued     500 mg Oral  Once 04/28/16 2216 04/28/16 2217   04/28/16 2230  doxycycline (VIBRA-TABS) tablet 100 mg  Status:  Discontinued     100 mg Oral  Once 04/28/16 2216 04/28/16 2217   04/28/16 2200  aztreonam (AZACTAM) 2 g in dextrose 5 % 50 mL IVPB  Status:  Discontinued     2 g 100 mL/hr over 30 Minutes Intravenous Every 8 hours 04/28/16 1939 04/28/16 2141   04/28/16 2200  metroNIDAZOLE (FLAGYL) IVPB 500 mg  Status:  Discontinued     500 mg 100 mL/hr over 60 Minutes Intravenous Every 8 hours 04/28/16 1941 04/28/16 2141   04/28/16 2200  vancomycin (VANCOCIN) 50 mg/mL oral solution 125 mg   Status:  Discontinued     125 mg Oral 4 times daily 04/28/16 2035 04/29/16 0125   04/28/16 1945  vancomycin (VANCOCIN) IVPB 1000 mg/200 mL premix     1,000 mg 200 mL/hr over 60 Minutes Intravenous  Once 04/28/16 1939 04/28/16 2136   04/28/16 1945  metroNIDAZOLE (FLAGYL) IVPB 500 mg  Status:  Discontinued     500 mg 100 mL/hr over 60 Minutes Intravenous Every 8 hours 04/28/16 1939 04/28/16 1941   04/28/16 1915  aztreonam (AZACTAM) 2 g in dextrose 5 % 50 mL IVPB     2 g 100 mL/hr over 30 Minutes Intravenous  Once 04/28/16 1903 04/28/16 2015   04/28/16 1915  metroNIDAZOLE (FLAGYL) IVPB 500 mg     500 mg 100 mL/hr over 60 Minutes Intravenous  Once 04/28/16 1903 04/28/16 2213   04/28/16 1915  vancomycin (VANCOCIN) IVPB 1000 mg/200 mL premix  Status:  Discontinued     1,000 mg 200 mL/hr over 60 Minutes Intravenous  Once 04/28/16 1903 04/30/16 1424   04/28/16 1908  aztreonam (AZACTAM) 1 g injection    Comments:  Simms, Marva   : cabinet override      04/28/16 1908 04/28/16 1951        Objective:   Vitals:   04/30/16 1300 04/30/16 1922 05/01/16 0613 05/01/16 1300  BP: 120/73 (!) 142/79 (!) 142/79 (!) 156/87  Pulse: 77 100 73 81  Resp:   16   Temp: 97.9 F (36.6 C) 98.4 F (36.9 C) 98 F (36.7 C) 97.9 F (36.6 C)  TempSrc: Oral Oral Oral Oral  SpO2: 98% 95% 100% 100%  Weight:      Height:        Wt Readings from Last 3 Encounters:  04/29/16 120 kg (264 lb 8.8 oz)  03/10/16 124.9 kg (275 lb 5.7 oz)  02/23/16 122.5 kg (270 lb)    No intake or output data in the 24 hours ending 05/01/16 1724   Physical Exam  Alert and comfortable.   CVS s1s2, RRR.   Lungs: clear  To auscultation, no wheezing or rhonchi.   Extremities: right lower extremity erythema, swellng and tenderness present.  Neuro: alert , and oriented.   Data Review:    CBC  Recent Labs Lab 04/28/16 1850 04/29/16 0250 04/30/16 0309  WBC 26.6* 15.1* 14.2*  HGB 15.4* 12.1 11.9*  HCT 45.9 38.5 38.0  PLT  234 160 166  MCV 84.8 87.9 88.4  MCH 28.5 27.6 27.7  MCHC 33.6 31.4 31.3  RDW 14.4 14.2 14.1  LYMPHSABS 1.6  --   --   MONOABS 2.1*  --   --   EOSABS 0.0  --   --   BASOSABS 0.0  --   --  Chemistries   Recent Labs Lab 04/28/16 1850 04/29/16 0250 04/30/16 0309  NA 129* 134* 139  K 3.9 3.9 3.6  CL 91* 105 104  CO2 23 22 24   GLUCOSE 308* 247* 122*  BUN 13 9 10   CREATININE 1.12* 0.77 0.72  CALCIUM 9.5 8.3* 8.9  AST 34 20 24  ALT 29 22 26   ALKPHOS 67 49 49  BILITOT 1.5* 0.9  0.9 0.4   ------------------------------------------------------------------------------------------------------------------ No results for input(s): CHOL, HDL, LDLCALC, TRIG, CHOLHDL, LDLDIRECT in the last 72 hours.  Lab Results  Component Value Date   HGBA1C 7.7 (H) 03/09/2016   ------------------------------------------------------------------------------------------------------------------ No results for input(s): TSH, T4TOTAL, T3FREE, THYROIDAB in the last 72 hours.  Invalid input(s): FREET3 ------------------------------------------------------------------------------------------------------------------ No results for input(s): VITAMINB12, FOLATE, FERRITIN, TIBC, IRON, RETICCTPCT in the last 72 hours.  Coagulation profile No results for input(s): INR, PROTIME in the last 168 hours.  No results for input(s): DDIMER in the last 72 hours.  Cardiac Enzymes No results for input(s): CKMB, TROPONINI, MYOGLOBIN in the last 168 hours.  Invalid input(s): CK ------------------------------------------------------------------------------------------------------------------    Component Value Date/Time   BNP 62.6 02/11/2015 0310    Inpatient Medications  Scheduled Meds: . aspirin EC  81 mg Oral Daily  . budesonide (PULMICORT) nebulizer solution  0.25 mg Nebulization BID  . cefTRIAXone (ROCEPHIN)  IV  2 g Intravenous Q24H  . Chlorhexidine Gluconate Cloth  6 each Topical Q0600  . enoxaparin  (LOVENOX) injection  60 mg Subcutaneous Daily  . fentaNYL  100 mcg Transdermal Q48H  . gabapentin  400 mg Oral BID   And  . gabapentin  700 mg Oral QHS  . guaiFENesin  1,200 mg Oral BID  . hydroxychloroquine  200 mg Oral BID  . insulin regular human CONCENTRATED  0-100 Units Subcutaneous TID WC   And  . insulin regular human CONCENTRATED  0-75 Units Subcutaneous QHS  . levothyroxine  200 mcg Oral QAC breakfast  . metronidazole  500 mg Intravenous Q8H  . mupirocin ointment  1 application Nasal BID  . saccharomyces boulardii  250 mg Oral BID  . ticagrelor  90 mg Oral BID  . vancomycin  1,250 mg Intravenous Q12H   Continuous Infusions:   PRN Meds:.acetaminophen **OR** acetaminophen, albuterol, ondansetron **OR** ondansetron (ZOFRAN) IV, oxyCODONE  Micro Results Recent Results (from the past 240 hour(s))  Culture, blood (Routine x 2)     Status: None (Preliminary result)   Collection Time: 04/28/16  6:50 PM  Result Value Ref Range Status   Specimen Description BLOOD LEFT ANTECUBITAL  Final   Special Requests   Final    BOTTLES DRAWN AEROBIC AND ANAEROBIC 5CC BOTH BOTTLES   Culture   Final    NO GROWTH 3 DAYS Performed at [facility]    Report Status PENDING  Incomplete  Culture, blood (Routine x 2)     Status: None (Preliminary result)   Collection Time: 04/28/16  7:10 PM  Result Value Ref Range Status   Specimen Description BLOOD RIGHT HAND  Final   Special Requests IN PEDIATRIC BOTTLE 1CC  Final   Culture   Final    NO GROWTH 3 DAYS Performed at [facility]    Report Status PENDING  Incomplete  Urine culture     Status: Abnormal   Collection Time: 04/28/16  8:12 PM  Result Value Ref Range Status   Specimen Description URINE, CLEAN CATCH  Final   Special Requests NONE  Final   Culture  MULTIPLE SPECIES PRESENT, SUGGEST RECOLLECTION (A)  Final   Report Status 04/30/2016 FINAL  Final  MRSA PCR Screening     Status: Abnormal   Collection Time: 04/28/16  11:24 PM  Result Value Ref Range Status   MRSA by PCR POSITIVE (A) NEGATIVE Final    Comment:        The GeneXpert MRSA Assay (FDA approved for NASAL specimens only), is one component of a comprehensive MRSA colonization surveillance program. It is not intended to diagnose MRSA infection nor to guide or monitor treatment for MRSA infections. RESULT CALLED TO, READ BACK BY AND VERIFIED WITH: THOMLINSON,RN @0459  04/29/16 MKELLY   C difficile quick scan w PCR reflex     Status: Abnormal   Collection Time: 04/29/16  2:37 PM  Result Value Ref Range Status   C Diff antigen POSITIVE (A) NEGATIVE Final   C Diff toxin NEGATIVE NEGATIVE Final   C Diff interpretation Results are indeterminate. See PCR results.  Final  Clostridium Difficile by PCR     Status: Abnormal   Collection Time: 04/29/16  2:37 PM  Result Value Ref Range Status   Toxigenic C Difficile by pcr POSITIVE (A) NEGATIVE Final  Culture, Urine     Status: None   Collection Time: 04/30/16 12:26 PM  Result Value Ref Range Status   Specimen Description URINE, RANDOM  Final   Special Requests NONE  Final   Culture NO GROWTH  Final   Report Status 05/01/2016 FINAL  Final    Radiology Reports Dg Ankle Complete Right  Result Date: 05/01/2016 CLINICAL DATA:  Right ankle bruising EXAM: RIGHT ANKLE - COMPLETE 3+ VIEW COMPARISON:  None. FINDINGS: Mild soft tissue swelling is noted. No acute fracture or dislocation is seen. Degenerative tarsal changes are noted. IMPRESSION: No acute bony abnormality did. Electronically Signed   By: 05/03/2016 M.D.   On: 05/01/2016 14:30   Dg Chest Port 1 View  Result Date: 04/28/2016 CLINICAL DATA:  Acute onset of vomiting and fever. Right leg swelling and pain. Initial encounter. EXAM: PORTABLE CHEST 1 VIEW COMPARISON:  Chest radiograph from 09/28/2015 FINDINGS: The lungs are well-aerated. Vascular congestion is noted. Mild left basilar opacity may reflect atelectasis or mild pneumonia. There is  no evidence of pleural effusion or pneumothorax. The cardiomediastinal silhouette is within normal limits. No acute osseous abnormalities are seen. IMPRESSION: Vascular congestion noted. Mild left basilar opacity may reflect atelectasis or mild pneumonia. Electronically Signed   By: 09/30/2015 M.D.   On: 04/28/2016 19:38    Time Spent in minutes  30   Maritza [name] M.D on 05/01/2016 at 5:24 PM  Between 7am to 7pm - Pager - [phone]  After 7pm go to [email or web address] - password [facility]  [facility] -  Office  [phone]

## 2016-05-01 NOTE — Progress Notes (Signed)
Pharmacy Antibiotic Note  [name] is a 55 y.o. female admitted on 04/28/2016 with cellulitis.  Pharmacy has been consulted for vancomycin, metronidazole and aztreonam dosing.  Vancomycin trough level this morning of 8 is below goal of 10-15 for cellulitis.  Plan: -Increase vancomycin to 1250 mg IV q 12 hrs. -metronidazole 500mg  IV q8h -aztreonam 2g IV q8h  Height: 5\' 2"  (157.5 cm) Weight: 264 lb 8.8 oz (120 kg) IBW/kg (Calculated) : 50.1  Temp (24hrs), Avg:98.1 F (36.7 C), Min:97.9 F (36.6 C), Max:98.4 F (36.9 C)   Recent Labs Lab 04/28/16 1850 04/28/16 1859 04/28/16 2216 04/29/16 0250 04/30/16 0309 05/01/16 0931  WBC 26.6*  --   --  15.1* 14.2*  --   CREATININE 1.12*  --   --  0.77 0.72  --   LATICACIDVEN  --  4.82* 1.51  --   --   --   VANCOTROUGH  --   --   --   --   --  8*    Estimated Creatinine Clearance: 99.1 mL/min (by C-G formula based on SCr of 0.8 mg/dL).    Allergies  Allergen Reactions  . Fish Allergy Anaphylaxis  . Fish Oil [Omega-3 Fatty Acids] Swelling    Throat swelling   . Gabapentin Other (See Comments)    Anxiety on high doses  . Ipratropium Shortness Of Breath  . Red Yeast Rice [Cholestin] Other (See Comments)    Muscle pain and severe joint pain.   . Atrovent Other (See Comments)    Wheezing becomes worse  . Crestor [Rosuvastatin] Other (See Comments)    Extreme joint pain Extreme joint pain, to this & other statins  . Shellfish Allergy   . Suprep [Na Sulfate-K Sulfate-Mg Sulf] Nausea And Vomiting  . Tape     Electrodes causes skin breakdown  . Penicillins Rash and Other (See Comments)    Unknown childhood reaction; Tolerates Keflex.   To undergo PCN challenge with allergist 02/2016    Antimicrobials this admission: Vanc 8/18 >>  Flagyl 8/18 >>  CTX 8/19>> Aztreonam 8/18>>8/18  Dose adjustments this admission: 8/21 VT = 8 (goal 10-15) > inc to 1250 q12  Microbiology results: 8/19 Cdiff: positive  8/18 BCx: ng x24 hrs,  final pending 8/18 UCx: multiple species 8/18 MRSA PCR: POS  Thank you for allowing pharmacy to be a part of this patient's care.  9/18, BCPS  Clinical Pharmacist Pager [phone]  05/01/2016 11:39 AM

## 2016-05-02 DIAGNOSIS — E871 Hypo-osmolality and hyponatremia: Secondary | ICD-10-CM

## 2016-05-02 DIAGNOSIS — I10 Essential (primary) hypertension: Secondary | ICD-10-CM

## 2016-05-02 LAB — GLUCOSE, CAPILLARY
Glucose-Capillary: 156 mg/dL — ABNORMAL HIGH (ref 65–99)
Glucose-Capillary: 241 mg/dL — ABNORMAL HIGH (ref 65–99)
Glucose-Capillary: 56 mg/dL — ABNORMAL LOW (ref 65–99)
Glucose-Capillary: 106 mg/dL — ABNORMAL HIGH (ref 65–99)
Glucose-Capillary: 110 mg/dL — ABNORMAL HIGH (ref 65–99)
Glucose-Capillary: 263 mg/dL — ABNORMAL HIGH (ref 65–99)

## 2016-05-02 MED ORDER — LORAZEPAM 1 MG PO TABS
1.0000 mg | ORAL_TABLET | Freq: Once | ORAL | Status: AC
  Administered 2016-05-02: 1 mg via ORAL
  Filled 2016-05-02: qty 1

## 2016-05-02 MED ORDER — DOXYCYCLINE HYCLATE 100 MG PO TABS
100.0000 mg | ORAL_TABLET | Freq: Two times a day (BID) | ORAL | Status: DC
  Administered 2016-05-02 – 2016-05-03 (×3): 100 mg via ORAL
  Filled 2016-05-02 (×3): qty 1

## 2016-05-02 NOTE — Progress Notes (Signed)
Hypoglycemic Event  CBG: 56 at 15:24  Treatment: Apple juice/peanut butter and crackers  Symptoms: Asymptomatic  Follow-up CBG: Time:15:43 CBG Result:110  Possible Reasons for Event: diminished intake  Comments/MD notified:notified Dr. Konrad Dolores in Amnion    [name]

## 2016-05-02 NOTE — Progress Notes (Signed)
Pt c/o feeling restless and "withdrawal" type symptoms. VSS. [facility] on call notified. Received orders for 1 mg PO ativan. Orders carried out. Nursing will continue to monitor.

## 2016-05-02 NOTE — Progress Notes (Signed)
Patient ID: [name], female   DOB: May 29, 1961, 55 y.o.   MRN: [id number]                                                                PROGRESS NOTE                                                                                                                                                                                                             Patient Demographics:    [name], is a 55 y.o. female, DOB - May 29, 1961, YNW:[id number]  Admit date - 04/28/2016   Admitting Physician Alberteen Samhristopher P Danford, MD  Outpatient Primary MD for the patient is Cheral BayHAWKS,ALDENE N, MD  LOS - 4  Outpatient Specialists:     Chief Complaint  Patient presents with  . 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  The patient wasin her usual state of health until she woke at midnight with violent vomiting. She felt sick, malaise, fever, "drunk" and weak at times and vomited NBNB emesis all day, until her husband came home in the afternoon and noticed that her right leg was red again, and took her to the ER.  In the ED she had a temperature of 99.3 F, heart rate 130s, respirations 28, blood pressure normal, Cr 1.12(baseline 0.7), WBC 26.6K, Hgb 15, lactic acid 4.8.  Chest x-ray showed no convincing opacity, UA clear.  There was spreading redness from the right calf.  In June she had cellulitis twice, initially treated and improved with Bactrim from the ER, and the second time requiring hospitalization with IV antibiotics.    Subjective:    [name] notes that redness from cellulitis is improving.    No headache, No chest pain, No abdominal pain - No Nausea, No new weakness tingling or numbness, No Cough - SOB. Diarrhea improving.   Assessment  & Plan :    Principal Problem:   Sepsis (HCC) Active Problems:   Diabetes mellitus without complication (HCC)   Acquired hypothyroidism   Benign essential HTN  Chronic pain associated with significant psychosocial dysfunction   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HCC)   CAD in native artery   Mild persistent asthma   Cellulitis of right leg   Hyperbilirubinemia   Hyponatremia   Edema   Sepsis due to  Right lower extremity cellulitis Organism unknown - sepsis physiology has improved/pt is hemodynamically stable   Afebrile and leukocytosis is improving.  She was started on IV vancomycin and iv rocephin.as she is MRSA positive,  Transition from IV to oral ABX (Treated successfully on Doxy in the past) for discharge. Due to multiple cases of RLE cellulitis she will likely need a prolonged course and f/u w/ ID. Pt already sees someone in ID clinic (per pt).  Ambulate, elevate the leg.  Improving.  She was found to have bruise on the foot of the right leg, get  Xray o the right foot was nml   Right lower extremity swelling: Duplex is negative for DVT.   C. DIff  Antigen positive  Started on PO flagyl started from 8/18 Day 4 of flagyl, complete 14 day course.   Acute renal failure: probably pre renal in origin.  Resolved with hydration.   Hyponatremia resolved  DM with hyperglycemia Cont fsbs ac and qhs, iss  Hyperbilirubinemia Stable  Hypothyroidism Cont levothyroxine  HTN Well controlled.  stable  Hx of CAD with DES in Jan 2017 statin intolerant - continue home ticagrelor and aspirin   MCTD continue Plaquenil  Asthma No wheezing heard.  continue Pulmicort  Chronic pain continue fentanyl 100 mcg patch   MRSA screen +    Code Status : FULL CODE  Family Communication  : no family present  Disposition Plan  :  Home on 8/23  Barriers For Discharge : transition to oral ABX  Consults  :  none  Procedures  :   CXR  8/18=> Vascular congestion noted. Mild left basilar opacity may reflect atelectasis or mild pneumonia.  DVT Prophylaxis  :  Lovenox - SCDs   Lab Results  Component Value Date   PLT 166 04/30/2016    Antibiotics  :    Anti-infectives    Start     Dose/Rate Route Frequency Ordered Stop   05/01/16 2000  metroNIDAZOLE (FLAGYL) tablet 500 mg     500 mg Oral Every 8 hours 05/01/16 1946     05/01/16 1800  metroNIDAZOLE (FLAGYL) tablet 500 mg  Status:  Discontinued     500 mg Oral Every 8 hours 05/01/16 1732 05/01/16 1946   05/01/16 1130  vancomycin (VANCOCIN) 1,250 mg in sodium chloride 0.9 % 250 mL IVPB  Status:  Discontinued     1,250 mg 166.7 mL/hr over 90 Minutes Intravenous Every 12 hours 05/01/16 1104 05/01/16 1732   04/29/16 2230  vancomycin (VANCOCIN) IVPB 750 mg/150 ml premix  Status:  Discontinued     750 mg 150 mL/hr over 60 Minutes Intravenous Every 12 hours 04/29/16 2228 05/01/16 1104   04/29/16 0800  cefTRIAXone (ROCEPHIN) 2 g in dextrose 5 % 50 mL IVPB     2 g 100 mL/hr over 30 Minutes Intravenous Every 24 hours 04/29/16 0125     04/29/16 0600  vancomycin (VANCOCIN) IVPB 750 mg/150 ml premix  Status:  Discontinued     750 mg 150 mL/hr over 60 Minutes Intravenous Every 12 hours 04/28/16 1939 04/29/16 2228   04/29/16 0400  aztreonam (AZACTAM) 2 g in dextrose 5 % 50 mL IVPB  Status:  Discontinued  2 g 100 mL/hr over 30 Minutes Intravenous Every 8 hours 04/28/16 2141 04/29/16 0125   04/29/16 0400  metroNIDAZOLE (FLAGYL) IVPB 500 mg  Status:  Discontinued     500 mg 100 mL/hr over 60 Minutes Intravenous Every 8 hours 04/28/16 2141 05/01/16 1732   04/29/16 0200  hydroxychloroquine (PLAQUENIL) tablet 200 mg     200 mg Oral 2 times daily 04/29/16 0125     04/28/16 2230  ciprofloxacin (CIPRO) tablet 500 mg  Status:  Discontinued     500 mg Oral  Once 04/28/16 2216 04/28/16 2217   04/28/16 2230  doxycycline (VIBRA-TABS) tablet 100 mg  Status:  Discontinued     100 mg Oral   Once 04/28/16 2216 04/28/16 2217   04/28/16 2200  aztreonam (AZACTAM) 2 g in dextrose 5 % 50 mL IVPB  Status:  Discontinued     2 g 100 mL/hr over 30 Minutes Intravenous Every 8 hours 04/28/16 1939 04/28/16 2141   04/28/16 2200  metroNIDAZOLE (FLAGYL) IVPB 500 mg  Status:  Discontinued     500 mg 100 mL/hr over 60 Minutes Intravenous Every 8 hours 04/28/16 1941 04/28/16 2141   04/28/16 2200  vancomycin (VANCOCIN) 50 mg/mL oral solution 125 mg  Status:  Discontinued     125 mg Oral 4 times daily 04/28/16 2035 04/29/16 0125   04/28/16 1945  vancomycin (VANCOCIN) IVPB 1000 mg/200 mL premix     1,000 mg 200 mL/hr over 60 Minutes Intravenous  Once 04/28/16 1939 04/28/16 2136   04/28/16 1945  metroNIDAZOLE (FLAGYL) IVPB 500 mg  Status:  Discontinued     500 mg 100 mL/hr over 60 Minutes Intravenous Every 8 hours 04/28/16 1939 04/28/16 1941   04/28/16 1915  aztreonam (AZACTAM) 2 g in dextrose 5 % 50 mL IVPB     2 g 100 mL/hr over 30 Minutes Intravenous  Once 04/28/16 1903 04/28/16 2015   04/28/16 1915  metroNIDAZOLE (FLAGYL) IVPB 500 mg     500 mg 100 mL/hr over 60 Minutes Intravenous  Once 04/28/16 1903 04/28/16 2213   04/28/16 1915  vancomycin (VANCOCIN) IVPB 1000 mg/200 mL premix  Status:  Discontinued     1,000 mg 200 mL/hr over 60 Minutes Intravenous  Once 04/28/16 1903 04/30/16 1424   04/28/16 1908  aztreonam (AZACTAM) 1 g injection    Comments:  Simms, Marva   : cabinet override      04/28/16 1908 04/28/16 1951        Objective:   Vitals:   05/01/16 1941 05/01/16 2218 05/02/16 0219 05/02/16 0655  BP: (!) 156/79  (!) 159/86 (!) 153/68  Pulse: 77 68 76 62  Resp: 16 16 18 18   Temp: 97.9 F (36.6 C)  98 F (36.7 C) 98.2 F (36.8 C)  TempSrc: Oral  Oral Oral  SpO2: 100%  98% 100%  Weight:      Height:        Wt Readings from Last 3 Encounters:  04/29/16 120 kg (264 lb 8.8 oz)  03/10/16 124.9 kg (275 lb 5.7 oz)  02/23/16 122.5 kg (270 lb)    No intake or output data in  the 24 hours ending 05/02/16 1212   Physical Exam  Alert and comfortable.   CVS s1s2, RRR.   Lungs: clear  To auscultation, no wheezing or rhonchi.   Extremities: right lower extremity erythema, swellng and tenderness present.  Neuro: alert , and oriented. Skin: RLE cellulitis faint w/ extension from ankle to Medial proximal  thigh   Data Review:    CBC  Recent Labs Lab 04/28/16 1850 04/29/16 0250 04/30/16 0309  WBC 26.6* 15.1* 14.2*  HGB 15.4* 12.1 11.9*  HCT 45.9 38.5 38.0  PLT 234 160 166  MCV 84.8 87.9 88.4  MCH 28.5 27.6 27.7  MCHC 33.6 31.4 31.3  RDW 14.4 14.2 14.1  LYMPHSABS 1.6  --   --   MONOABS 2.1*  --   --   EOSABS 0.0  --   --   BASOSABS 0.0  --   --     Chemistries   Recent Labs Lab 04/28/16 1850 04/29/16 0250 04/30/16 0309  NA 129* 134* 139  K 3.9 3.9 3.6  CL 91* 105 104  CO2 23 22 24   GLUCOSE 308* 247* 122*  BUN 13 9 10   CREATININE 1.12* 0.77 0.72  CALCIUM 9.5 8.3* 8.9  AST 34 20 24  ALT 29 22 26   ALKPHOS 67 49 49  BILITOT 1.5* 0.9  0.9 0.4   ------------------------------------------------------------------------------------------------------------------ No results for input(s): CHOL, HDL, LDLCALC, TRIG, CHOLHDL, LDLDIRECT in the last 72 hours.  Lab Results  Component Value Date   HGBA1C 7.7 (H) 03/09/2016   ------------------------------------------------------------------------------------------------------------------ No results for input(s): TSH, T4TOTAL, T3FREE, THYROIDAB in the last 72 hours.  Invalid input(s): FREET3 ------------------------------------------------------------------------------------------------------------------ No results for input(s): VITAMINB12, FOLATE, FERRITIN, TIBC, IRON, RETICCTPCT in the last 72 hours.  Coagulation profile No results for input(s): INR, PROTIME in the last 168 hours.  No results for input(s): DDIMER in the last 72 hours.  Cardiac Enzymes No results for input(s): CKMB,  TROPONINI, MYOGLOBIN in the last 168 hours.  Invalid input(s): CK ------------------------------------------------------------------------------------------------------------------    Component Value Date/Time   BNP 62.6 02/11/2015 0310    Inpatient Medications  Scheduled Meds: . aspirin EC  81 mg Oral Daily  . budesonide (PULMICORT) nebulizer solution  0.25 mg Nebulization BID  . cefTRIAXone (ROCEPHIN)  IV  2 g Intravenous Q24H  . Chlorhexidine Gluconate Cloth  6 each Topical Q0600  . enoxaparin (LOVENOX) injection  60 mg Subcutaneous Daily  . fentaNYL  100 mcg Transdermal Q48H  . gabapentin  400 mg Oral BID   And  . gabapentin  700 mg Oral QHS  . guaiFENesin  1,200 mg Oral BID  . hydroxychloroquine  200 mg Oral BID  . insulin regular human CONCENTRATED  0-100 Units Subcutaneous TID WC   And  . insulin regular human CONCENTRATED  0-75 Units Subcutaneous QHS  . levothyroxine  200 mcg Oral QAC breakfast  . metroNIDAZOLE  500 mg Oral Q8H  . mupirocin ointment  1 application Nasal BID  . saccharomyces boulardii  250 mg Oral BID  . ticagrelor  90 mg Oral BID   Continuous Infusions:   PRN Meds:.acetaminophen **OR** acetaminophen, albuterol, ondansetron **OR** ondansetron (ZOFRAN) IV, oxyCODONE  Micro Results Recent Results (from the past 240 hour(s))  Culture, blood (Routine x 2)     Status: None (Preliminary result)   Collection Time: 04/28/16  6:50 PM  Result Value Ref Range Status   Specimen Description BLOOD LEFT ANTECUBITAL  Final   Special Requests   Final    BOTTLES DRAWN AEROBIC AND ANAEROBIC 5CC BOTH BOTTLES   Culture   Final    NO GROWTH 3 DAYS Performed at [facility]    Report Status PENDING  Incomplete  Culture, blood (Routine x 2)     Status: None (Preliminary result)   Collection Time: 04/28/16  7:10 PM  Result Value  Ref Range Status   Specimen Description BLOOD RIGHT HAND  Final   Special Requests IN PEDIATRIC BOTTLE 1CC  Final   Culture    Final    NO GROWTH 3 DAYS Performed at [facility]    Report Status PENDING  Incomplete  Urine culture     Status: Abnormal   Collection Time: 04/28/16  8:12 PM  Result Value Ref Range Status   Specimen Description URINE, CLEAN CATCH  Final   Special Requests NONE  Final   Culture MULTIPLE SPECIES PRESENT, SUGGEST RECOLLECTION (A)  Final   Report Status 04/30/2016 FINAL  Final  MRSA PCR Screening     Status: Abnormal   Collection Time: 04/28/16 11:24 PM  Result Value Ref Range Status   MRSA by PCR POSITIVE (A) NEGATIVE Final    Comment:        The GeneXpert MRSA Assay (FDA approved for NASAL specimens only), is one component of a comprehensive MRSA colonization surveillance program. It is not intended to diagnose MRSA infection nor to guide or monitor treatment for MRSA infections. RESULT CALLED TO, READ BACK BY AND VERIFIED WITH: THOMLINSON,RN @0459  04/29/16 MKELLY   C difficile quick scan w PCR reflex     Status: Abnormal   Collection Time: 04/29/16  2:37 PM  Result Value Ref Range Status   C Diff antigen POSITIVE (A) NEGATIVE Final   C Diff toxin NEGATIVE NEGATIVE Final   C Diff interpretation Results are indeterminate. See PCR results.  Final  Clostridium Difficile by PCR     Status: Abnormal   Collection Time: 04/29/16  2:37 PM  Result Value Ref Range Status   Toxigenic C Difficile by pcr POSITIVE (A) NEGATIVE Final  Culture, Urine     Status: None   Collection Time: 04/30/16 12:26 PM  Result Value Ref Range Status   Specimen Description URINE, RANDOM  Final   Special Requests NONE  Final   Culture NO GROWTH  Final   Report Status 05/01/2016 FINAL  Final    Radiology Reports Dg Ankle Complete Right  Result Date: 05/01/2016 CLINICAL DATA:  Right ankle bruising EXAM: RIGHT ANKLE - COMPLETE 3+ VIEW COMPARISON:  None. FINDINGS: Mild soft tissue swelling is noted. No acute fracture or dislocation is seen. Degenerative tarsal changes are noted. IMPRESSION: No  acute bony abnormality did. Electronically Signed   By: Alcide Clever M.D.   On: 05/01/2016 14:30   Dg Chest Port 1 View  Result Date: 04/28/2016 CLINICAL DATA:  Acute onset of vomiting and fever. Right leg swelling and pain. Initial encounter. EXAM: PORTABLE CHEST 1 VIEW COMPARISON:  Chest radiograph from 09/28/2015 FINDINGS: The lungs are well-aerated. Vascular congestion is noted. Mild left basilar opacity may reflect atelectasis or mild pneumonia. There is no evidence of pleural effusion or pneumothorax. The cardiomediastinal silhouette is within normal limits. No acute osseous abnormalities are seen. IMPRESSION: Vascular congestion noted. Mild left basilar opacity may reflect atelectasis or mild pneumonia. Electronically Signed   By: Roanna Raider M.D.   On: 04/28/2016 19:38    Time Spent in minutes  30   MERRELL, DAVID J M.D on 05/02/2016 at 12:12 PM  Between 7am to 7pm - Pager - [phone]  After 7pm go to [email or web address] - password [facility]  [facility] -  Office  [phone]

## 2016-05-03 DIAGNOSIS — E119 Type 2 diabetes mellitus without complications: Secondary | ICD-10-CM

## 2016-05-03 DIAGNOSIS — E039 Hypothyroidism, unspecified: Secondary | ICD-10-CM

## 2016-05-03 DIAGNOSIS — I2510 Atherosclerotic heart disease of native coronary artery without angina pectoris: Secondary | ICD-10-CM

## 2016-05-03 DIAGNOSIS — A419 Sepsis, unspecified organism: Principal | ICD-10-CM

## 2016-05-03 DIAGNOSIS — L03115 Cellulitis of right lower limb: Secondary | ICD-10-CM

## 2016-05-03 LAB — BASIC METABOLIC PANEL
Anion gap: 8 (ref 5–15)
BUN: 13 mg/dL (ref 6–20)
CO2: 26 mmol/L (ref 22–32)
Calcium: 9.3 mg/dL (ref 8.9–10.3)
Chloride: 103 mmol/L (ref 101–111)
Creatinine, Ser: 0.78 mg/dL (ref 0.44–1.00)
GFR calc Af Amer: >60 mL/min (ref >60)
GFR calc non Af Amer: >60 mL/min (ref >60)
Glucose, Bld: 189 mg/dL — ABNORMAL HIGH (ref 65–99)
Potassium: 3.9 mmol/L (ref 3.5–5.1)
Sodium: 137 mmol/L (ref 135–145)

## 2016-05-03 LAB — CBC
HCT: 41.6 % (ref 36.0–46.0)
Hemoglobin: 13.1 g/dL (ref 12.0–15.0)
MCH: 27.9 pg (ref 26.0–34.0)
MCHC: 31.5 g/dL (ref 30.0–36.0)
MCV: 88.5 fL (ref 78.0–100.0)
Platelets: 273 10*3/uL (ref 150–400)
RBC: 4.7 MIL/uL (ref 3.87–5.11)
RDW: 14.1 % (ref 11.5–15.5)
WBC: 9.8 10*3/uL (ref 4.0–10.5)

## 2016-05-03 LAB — GLUCOSE, CAPILLARY
Glucose-Capillary: 63 mg/dL — ABNORMAL LOW (ref 65–99)
Glucose-Capillary: 118 mg/dL — ABNORMAL HIGH (ref 65–99)
Glucose-Capillary: 216 mg/dL — ABNORMAL HIGH (ref 65–99)
Glucose-Capillary: 216 mg/dL — ABNORMAL HIGH (ref 65–99)
Glucose-Capillary: 138 mg/dL — ABNORMAL HIGH (ref 65–99)

## 2016-05-03 LAB — CULTURE, BLOOD (ROUTINE X 2)
Culture: NO GROWTH
Culture: NO GROWTH

## 2016-05-03 MED ORDER — INSULIN REGULAR HUMAN (CONC) 500 UNIT/ML ~~LOC~~ SOPN
0.0000 [IU] | PEN_INJECTOR | Freq: Two times a day (BID) | SUBCUTANEOUS | Status: DC
  Filled 2016-05-03: qty 3

## 2016-05-03 MED ORDER — FUROSEMIDE 40 MG PO TABS
40.0000 mg | ORAL_TABLET | Freq: Every day | ORAL | Status: DC
Start: 2016-05-03 — End: 2016-05-04
  Administered 2016-05-03 – 2016-05-04 (×2): 40 mg via ORAL
  Filled 2016-05-03 (×2): qty 1

## 2016-05-03 MED ORDER — INSULIN REGULAR HUMAN (CONC) 500 UNIT/ML ~~LOC~~ SOPN
0.0000 [IU] | PEN_INJECTOR | Freq: Two times a day (BID) | SUBCUTANEOUS | Status: DC
  Administered 2016-05-03: 70 [IU] via SUBCUTANEOUS

## 2016-05-03 MED ORDER — VANCOMYCIN HCL 10 G IV SOLR
1250.0000 mg | Freq: Two times a day (BID) | INTRAVENOUS | Status: DC
  Administered 2016-05-03 – 2016-05-04 (×2): 1250 mg via INTRAVENOUS
  Filled 2016-05-03 (×3): qty 1250

## 2016-05-03 MED ORDER — DEXTROSE 5 % IV SOLN
2.0000 g | INTRAVENOUS | Status: DC
  Administered 2016-05-03 – 2016-05-04 (×2): 2 g via INTRAVENOUS
  Filled 2016-05-03 (×2): qty 2

## 2016-05-03 MED ORDER — VANCOMYCIN HCL 10 G IV SOLR
2000.0000 mg | Freq: Once | INTRAVENOUS | Status: AC
  Administered 2016-05-03: 2000 mg via INTRAVENOUS
  Filled 2016-05-03: qty 2000

## 2016-05-03 NOTE — Progress Notes (Signed)
Results for Large, Inez (MRN [id number]) as of 05/03/2016 12:17  Ref. Range 05/02/2016 16:27 05/02/2016 21:43 05/03/2016 06:23 05/03/2016 07:05 05/03/2016 11:23  Glucose-Capillary Latest Ref Range: 65 - 99 mg/dL 791 (H) 504 (H) 63 (L) 118 (H) 216 (H)   Noted that blood sugars have been less than 70 mg/dl 2 times in the last 24 hours.  Recommend changing U-500 regular insulin to give before breakfast and before supper and discontinue the lunch and bedtime doses.  Patient eating 75-100 % of meals, but intake may be less than what is taken at home.  Continue checking CBGs TID & HS. Will continue to monitor blood sugars while in the hospital. Smith Mince RN BSN CDE

## 2016-05-03 NOTE — Progress Notes (Signed)
Pharmacy Antibiotic Note  [name] is a 55 y.o. female admitted on 04/28/2016 with RLE cellulitis.  At that time pt was started on Vancomycin and Rocephin.  Pt clinically improved and was transitioned to PO Doxy 8/22.  Pt now clinically worse and Pharmacy has been consulted to restart Vancomycin and Rocephin.  Of note, pt is also on Flagyl for C diff.  Plan: Vancomycin 2000 mg IV loading dose x 1 Followed by Vancomycin 1250 mg IV every 12 hours.  Goal trough 10-15 mcg/mL. Rocephin 2gm IV q24h  Height: 5\' 2"  (157.5 cm) Weight: 264 lb 8.8 oz (120 kg) IBW/kg (Calculated) : 50.1  Temp (24hrs), Avg:98.3 F (36.8 C), Min:97.8 F (36.6 C), Max:98.9 F (37.2 C)   Recent Labs Lab 04/28/16 1850 04/28/16 1859 04/28/16 2216 04/29/16 0250 04/30/16 0309 05/01/16 0931 05/03/16 0344  WBC 26.6*  --   --  15.1* 14.2*  --  9.8  CREATININE 1.12*  --   --  0.77 0.72  --  0.78  LATICACIDVEN  --  4.82* 1.51  --   --   --   --   VANCOTROUGH  --   --   --   --   --  8*  --     Estimated Creatinine Clearance: 99.1 mL/min (by C-G formula based on SCr of 0.8 mg/dL).    Allergies  Allergen Reactions  . Fish Allergy Anaphylaxis  . Fish Oil [Omega-3 Fatty Acids] Swelling    Throat swelling   . Gabapentin Other (See Comments)    Anxiety on high doses  . Ipratropium Shortness Of Breath  . Red Yeast Rice [Cholestin] Other (See Comments)    Muscle pain and severe joint pain.   . Atrovent Other (See Comments)    Wheezing becomes worse  . Crestor [Rosuvastatin] Other (See Comments)    Extreme joint pain Extreme joint pain, to this & other statins  . Shellfish Allergy   . Suprep [Na Sulfate-K Sulfate-Mg Sulf] Nausea And Vomiting  . Tape     Electrodes causes skin breakdown  . Penicillins Rash and Other (See Comments)    Unknown childhood reaction; Tolerates Keflex.   To undergo PCN challenge with allergist 02/2016    Antimicrobials this admission: Aztreonam x 1 8/18 Rocephin 8/19  >> Vancomycin 8/19 >> 8/21, restart 8/23 Doxy PO 8/22 >> 8/23  Dose adjustments this admission: 8/21 Vancomycin trough = 8, dose adjusted to   Microbiology results: 8/20 urine >> no growth F 8/19 CDiff PCR + 8/18 MRSA PCR + 8/18 blood x 2 ngtd  Thank you for allowing pharmacy to be a part of this patient's care.  9/18, Pharm.D., BCPS Clinical Pharmacist Pager [phone] 05/03/2016 11:00 AM

## 2016-05-03 NOTE — Progress Notes (Signed)
Blood sugar this am was 63 patient asymptomatic given juice and crackers with peanut butter, rechecked blood sugar was 118. Text paged the on call MD. Will continue to monitor patients blood sugar.

## 2016-05-03 NOTE — Progress Notes (Signed)
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 The patient wasin her usual state of health until she woke at midnight with violent vomiting. She felt sick, malaise, fever, "drunk" and weak at times and vomited NBNB emesis all day, until her husband came home in the afternoon and noticed that her right leg was red again, and took her to the ER.  In the ED she had a temperature of 99.3 F, heart rate 130s, respirations 28, blood pressure normal, Cr 1.12(baseline 0.7), WBC 26.6K, Hgb 15, lactic acid 4.8. Chest x-ray showed no convincing opacity, UA clear. There was spreading redness from the right calf.  In June she had cellulitis twice, initially treated and improved with Bactrim from the ER, and the second time requiring hospitalization with IV antibiotics.   Assessment & Plan:   Principal Problem:   Sepsis (HCC) Active Problems:   Diabetes mellitus without complication (HCC)   Acquired hypothyroidism   Benign essential HTN   Chronic pain associated with significant psychosocial dysfunction   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HCC)   CAD in native artery   Mild persistent asthma   Cellulitis of right leg   Hyperbilirubinemia   Hyponatremia   Edema  Sepsis due to right lower extremity cellulitis, lactic acid 4.82/tachycardic.  Sepsis physiology has resolved. -  Increased redness and swelling of the right lower extremity -  Resume vancomycin and ceftriaxone -  I suspect that some of her symptoms are secondary to edema, stasis dermatitis -  Resume Lasix -  Continue TED hose and elevation of the right lower extremity  Right lower  extremity swelling: Duplex is negative for DVT.   C. DIff  Antigen positive, loose stools without diarrhea Continue PO flagyl started on 8/18.  Continue for 1 week after cessation of broad-spectrum antibiotics Continue Florastor  Acute renal failure and hyponatremia: probably pre renal in origin.  Resolved with hydration.   DM with hyperglycemia, hypoglycemic this morning and yesterday afternoon -  Appreciate diabetic educator assistance -  Concentrated insulin reduced to sliding-scale twice a day  Hyperbilirubinemia due to dehydration, resolved with IV fluids  Hypothyroidism, stable, cont levothyroxine  HTN, well controlled.   Hx of CAD with DES in Jan 2017, statin intolerant  - continue home ticagrelor and aspirin -  Unclear why not on beta blocker  MCTD, stable, continue Plaquenil  Asthma, stable, continue Pulmicort  Chronic pain continue fentanyl 100 mcg patch  Continue oxycodone for breakthrough pain Continue gabapentin   DVT prophylaxis:  Lovenox Code Status:  Full code Family Communication:  Patient alone Disposition Plan:  Pending decreased swelling and redness of the right lower extremity   Consultants:   None  Procedures:  None  Antimicrobials:   Vancomycin  Ceftriaxone  Flagyl   Subjective:  Reports increased redness and swelling of the right lower extremity. She has been using a tape measure that she carries with her to measure her calf diameter which is gone up several centimeters over the last 24-48 hours. Redness had previously receded to just the calf area but this morning is more obvious along the medial thigh. She denies being up out of bed or sitting in a chair this morning. She  states to she was resting with her legs elevated and went to the bathroom prior to sitting down in the chair right before I arrived to examine her  Objective: Vitals:   05/02/16 2022 05/03/16 0400 05/03/16 0945 05/03/16 1322  BP:  (!) 141/70    Pulse:   65    Resp:  18    Temp:  97.8 F (36.6 C)    TempSrc:  Oral    SpO2: 100% 97% 98% 97%  Weight:      Height:        Intake/Output Summary (Last 24 hours) at 05/03/16 1416 Last data filed at 05/03/16 0935  Gross per 24 hour  Intake              960 ml  Output                0 ml  Net              960 ml   Filed Weights   04/28/16 1833 04/28/16 2349 04/29/16 0354  Weight: 116.6 kg (257 lb) 120.2 kg (264 lb 14.4 oz) 120 kg (264 lb 8.8 oz)    Examination:  General exam:  Adult Female.  No acute distress.  HEENT:  NCAT, MMM Respiratory system: Clear to auscultation bilaterally Cardiovascular system: Regular rate and rhythm, normal S1/S2. No murmurs, rubs, gallops or clicks.  Warm extremities Gastrointestinal system: Hyperactive bowel sounds, soft, nondistended, nontender. MSK:  Normal tone and bulk, 2+ pitting edema of the right lower extremity with erythema that is bright pink in color along the calf extending up the medial thigh of the right leg to the grown. Warm and tender to touch Neuro:  Grossly intact    Data Reviewed: I have personally reviewed following labs and imaging studies  CBC:  Recent Labs Lab 04/28/16 1850 04/29/16 0250 04/30/16 0309 05/03/16 0344  WBC 26.6* 15.1* 14.2* 9.8  NEUTROABS 22.9*  --   --   --   HGB 15.4* 12.1 11.9* 13.1  HCT 45.9 38.5 38.0 41.6  MCV 84.8 87.9 88.4 88.5  PLT 234 160 166 273   Basic Metabolic Panel:  Recent Labs Lab 04/28/16 1850 04/29/16 0250 04/30/16 0309 05/03/16 0344  NA 129* 134* 139 137  K 3.9 3.9 3.6 3.9  CL 91* 105 104 103  CO2 23 22 24 26   GLUCOSE 308* 247* 122* 189*  BUN 13 9 10 13   CREATININE 1.12* 0.77 0.72 0.78  CALCIUM 9.5 8.3* 8.9 9.3   GFR: Estimated Creatinine Clearance: 99.1 mL/min (by C-G formula based on SCr of 0.8 mg/dL). Liver Function Tests:  Recent Labs Lab 04/28/16 1850 04/29/16 0250 04/30/16 0309  AST 34 20 24  ALT 29 22 26   ALKPHOS 67 49 49  BILITOT 1.5* 0.9  0.9 0.4    PROT 7.9 5.9* 5.9*  ALBUMIN 4.0 2.8* 2.7*   No results for input(s): LIPASE, AMYLASE in the last 168 hours. No results for input(s): AMMONIA in the last 168 hours. Coagulation Profile: No results for input(s): INR, PROTIME in the last 168 hours. Cardiac Enzymes: No results for input(s): CKTOTAL, CKMB, CKMBINDEX, TROPONINI in the last 168 hours. BNP (last 3 results) No results for input(s): PROBNP in the last 8760 hours. HbA1C: No results for input(s): HGBA1C in the last 72 hours. CBG:  Recent Labs Lab 05/02/16 1627 05/02/16 2143 05/03/16 0623 05/03/16 0705 05/03/16 1123  GLUCAP 106* 263* 63* 118* 216*   Lipid Profile: No results for input(s):  CHOL, HDL, LDLCALC, TRIG, CHOLHDL, LDLDIRECT in the last 72 hours. Thyroid Function Tests: No results for input(s): TSH, T4TOTAL, FREET4, T3FREE, THYROIDAB in the last 72 hours. Anemia Panel: No results for input(s): VITAMINB12, FOLATE, FERRITIN, TIBC, IRON, RETICCTPCT in the last 72 hours. Urine analysis:    Component Value Date/Time   COLORURINE YELLOW 04/28/2016 2012   APPEARANCEUR CLEAR 04/28/2016 2012   LABSPEC 1.004 (L) 04/28/2016 2012   PHURINE 6.5 04/28/2016 2012   GLUCOSEU 100 (A) 04/28/2016 2012   HGBUR NEGATIVE 04/28/2016 2012   BILIRUBINUR NEGATIVE 04/28/2016 2012   KETONESUR NEGATIVE 04/28/2016 2012   PROTEINUR NEGATIVE 04/28/2016 2012   UROBILINOGEN 0.2 02/10/2015 1835   NITRITE NEGATIVE 04/28/2016 2012   LEUKOCYTESUR NEGATIVE 04/28/2016 2012   Sepsis Labs: @LABRCNTIP (procalcitonin:4,lacticidven:4)  ) Recent Results (from the past 240 hour(s))  Culture, blood (Routine x 2)     Status: None   Collection Time: 04/28/16  6:50 PM  Result Value Ref Range Status   Specimen Description BLOOD LEFT ANTECUBITAL  Final   Special Requests   Final    BOTTLES DRAWN AEROBIC AND ANAEROBIC 5CC BOTH BOTTLES   Culture   Final    NO GROWTH 5 DAYS [facility] [place]    Report Status 05/03/2016 FINAL  Final   Culture, blood (Routine x 2)     Status: None   Collection Time: 04/28/16  7:10 PM  Result Value Ref Range Status   Specimen Description BLOOD RIGHT HAND  Final   Special Requests IN PEDIATRIC BOTTLE 1CC  Final   Culture   Final    NO GROWTH 5 DAYS [facility] at [facility] [place]    Report Status 05/03/2016 FINAL  Final  Urine culture     Status: Abnormal   Collection Time: 04/28/16  8:12 PM  Result Value Ref Range Status   Specimen Description URINE, CLEAN CATCH  Final   Special Requests NONE  Final   Culture MULTIPLE SPECIES PRESENT, SUGGEST RECOLLECTION (A)  Final   Report Status 04/30/2016 FINAL  Final  MRSA PCR Screening     Status: Abnormal   Collection Time: 04/28/16 11:24 PM  Result Value Ref Range Status   MRSA by PCR POSITIVE (A) NEGATIVE Final    Comment:        The GeneXpert MRSA Assay (FDA approved for NASAL specimens only), is one component of a comprehensive MRSA colonization surveillance program. It is not intended to diagnose MRSA infection nor to guide or monitor treatment for MRSA infections. RESULT CALLED TO, READ BACK BY AND VERIFIED WITH: THOMLINSON,RN @0459  04/29/16 MKELLY   C difficile quick scan w PCR reflex     Status: Abnormal   Collection Time: 04/29/16  2:37 PM  Result Value Ref Range Status   C Diff antigen POSITIVE (A) NEGATIVE Final   C Diff toxin NEGATIVE NEGATIVE Final   C Diff interpretation Results are indeterminate. See PCR results.  Final  Clostridium Difficile by PCR     Status: Abnormal   Collection Time: 04/29/16  2:37 PM  Result Value Ref Range Status   Toxigenic C Difficile by pcr POSITIVE (A) NEGATIVE Final  Culture, Urine     Status: None   Collection Time: 04/30/16 12:26 PM  Result Value Ref Range Status   Specimen Description URINE, RANDOM  Final   Special Requests NONE  Final   Culture NO GROWTH  Final   Report Status 05/01/2016 FINAL  Final      Radiology Studies: Dg  Ankle Complete Right  Result Date:  05/01/2016 CLINICAL DATA:  Right ankle bruising EXAM: RIGHT ANKLE - COMPLETE 3+ VIEW COMPARISON:  None. FINDINGS: Mild soft tissue swelling is noted. No acute fracture or dislocation is seen. Degenerative tarsal changes are noted. IMPRESSION: No acute bony abnormality did. Electronically Signed   By: Alcide Clever M.D.   On: 05/01/2016 14:30     Scheduled Meds: . aspirin EC  81 mg Oral Daily  . budesonide (PULMICORT) nebulizer solution  0.25 mg Nebulization BID  . cefTRIAXone (ROCEPHIN)  IV  2 g Intravenous Q24H  . Chlorhexidine Gluconate Cloth  6 each Topical Q0600  . enoxaparin (LOVENOX) injection  60 mg Subcutaneous Daily  . fentaNYL  100 mcg Transdermal Q48H  . furosemide  40 mg Oral Daily  . gabapentin  400 mg Oral BID   And  . gabapentin  700 mg Oral QHS  . guaiFENesin  1,200 mg Oral BID  . hydroxychloroquine  200 mg Oral BID  . [START ON 05/04/2016] insulin regular human CONCENTRATED  0-100 Units Subcutaneous BID WC  . levothyroxine  200 mcg Oral QAC breakfast  . metroNIDAZOLE  500 mg Oral Q8H  . mupirocin ointment  1 application Nasal BID  . saccharomyces boulardii  250 mg Oral BID  . ticagrelor  90 mg Oral BID  . vancomycin  1,250 mg Intravenous Q12H  . vancomycin  2,000 mg Intravenous Once   Continuous Infusions:    LOS: 5 days    Time spent: 30 min    Renae Fickle, MD [facility] Pager [phone]  If 7PM-7AM, please contact night-coverage [email or web address] Password [email or web address] 05/03/2016, 2:16 PM

## 2016-05-04 LAB — BASIC METABOLIC PANEL
Anion gap: 8 (ref 5–15)
BUN: 11 mg/dL (ref 6–20)
CO2: 32 mmol/L (ref 22–32)
Calcium: 9.5 mg/dL (ref 8.9–10.3)
Chloride: 100 mmol/L — ABNORMAL LOW (ref 101–111)
Creatinine, Ser: 0.8 mg/dL (ref 0.44–1.00)
GFR calc Af Amer: >60 mL/min (ref >60)
GFR calc non Af Amer: >60 mL/min (ref >60)
Glucose, Bld: 135 mg/dL — ABNORMAL HIGH (ref 65–99)
Potassium: 3.8 mmol/L (ref 3.5–5.1)
Sodium: 140 mmol/L (ref 135–145)

## 2016-05-04 LAB — CBC
HCT: 45.1 % (ref 36.0–46.0)
Hemoglobin: 13.9 g/dL (ref 12.0–15.0)
MCH: 27.5 pg (ref 26.0–34.0)
MCHC: 30.8 g/dL (ref 30.0–36.0)
MCV: 89.3 fL (ref 78.0–100.0)
Platelets: 319 10*3/uL (ref 150–400)
RBC: 5.05 MIL/uL (ref 3.87–5.11)
RDW: 13.9 % (ref 11.5–15.5)
WBC: 13.6 10*3/uL — ABNORMAL HIGH (ref 4.0–10.5)

## 2016-05-04 LAB — GLUCOSE, CAPILLARY
Glucose-Capillary: 123 mg/dL — ABNORMAL HIGH (ref 65–99)
Glucose-Capillary: 244 mg/dL — ABNORMAL HIGH (ref 65–99)

## 2016-05-04 MED ORDER — CEPHALEXIN 500 MG PO CAPS: 500.0000 mg | ORAL_CAPSULE | Freq: Four times a day (QID) | ORAL | 0 refills | Status: DC

## 2016-05-04 MED ORDER — SULFAMETHOXAZOLE-TRIMETHOPRIM 800-160 MG PO TABS: 2.0000 | ORAL_TABLET | Freq: Two times a day (BID) | ORAL | 0 refills | Status: DC

## 2016-05-04 MED ORDER — METRONIDAZOLE 500 MG PO TABS: 500.0000 mg | ORAL_TABLET | Freq: Three times a day (TID) | ORAL | 0 refills | Status: DC

## 2016-05-04 NOTE — Plan of Care (Signed)
Problem: Safety: Goal: Ability to remain free from injury will improve Outcome: Progressing Fall precautions and preventions maintained  Problem: Pain Managment: Goal: General experience of comfort will improve Outcome: Progressing Medicated once for pain with moderate relief  Problem: Physical Regulation: Goal: Ability to maintain clinical measurements within normal limits will improve Outcome: Progressing Patient is self care  Problem: Tissue Perfusion: Goal: Risk factors for ineffective tissue perfusion will decrease Outcome: Progressing Denies S/S of DVT  Problem: Activity: Goal: Risk for activity intolerance will decrease Outcome: Progressing Patient is in and out of bed independently  Problem: Bowel/Gastric: Goal: Will not experience complications related to bowel motility Outcome: Progressing No gastric or bowel complications reported  Problem: Skin Integrity: Goal: Skin integrity will improve Outcome: Progressing Patient verbalizes less skin discoloration

## 2016-05-04 NOTE — Discharge Summary (Signed)
Physician Discharge Summary  [name] WGN:[id number] DOB: 07/14/1961 DOA: 04/28/2016  PCP: Cheral Bay, MD  Admit date: 04/28/2016 Discharge date: 05/04/2016  Admitted From: home  Disposition:  home  Recommendations for Outpatient Follow-up:  1. Follow up with Infectious Disease in 1-2 weeks 2. Please obtain BMP/CBC in one week  Home Health:  none  Equipment/Devices:  none  Discharge Condition:  Stable, improved CODE STATUS:  full  Diet recommendation:  diabetic   Brief/Interim Summary:  55 y.o.femalewith a history of hypothyroidism, Cdiff a year ago, MCTD on Plaquenil, DM, CAD s/p DES in Jan 2017, persistent asthma, chronic pain on fentanyl patch,and recent recurrent RLE cellulitis who presentedwith fever, sepsis,and right leg redness w/swelling. The patient wasin her usual state of health until she woke at midnight with violent vomiting. She felt sick, malaise, fever, "drunk" and weak at times and vomited NBNB emesis all day until her husband noticed that her right leg was red again and took her to the ER.  In June she had cellulitis twice, initially treated and improved with Bactrim from the ER, and the second time requiring hospitalization with IV antibiotics.   Discharge Diagnoses:  Principal Problem:   Sepsis (HCC) Active Problems:   Diabetes mellitus without complication (HCC)   Acquired hypothyroidism   Benign essential HTN   Chronic pain associated with significant psychosocial dysfunction   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HCC)   CAD in native artery   Mild persistent asthma   Cellulitis of right leg   Hyperbilirubinemia   Hyponatremia   Edema  Sepsis due to right lower extremity cellulitis, lactic acid 4.82 and tachycardic at admission.  She had swelling and redness of the right leg, however, duplex was negative for DVT.  She was started on vancomycin and ceftriaxone and had a gradual improvement her symptoms.  First attempt to change to oral  antibiotics resulted in fast recurrence of her symptoms and her IV antibiotics were resumed for 48 hours.  She should continue oral antibiotics bactrim + keflex for treatment of her cellulitis until she follows up with the Infectious Disease clinic.  She should continue to use her TED hose and elevate her right lower extremity when resting.    C. DIff Antigen positive, loose stools without diarrhea.  PO flagyl started on 8/18.  Continue for 1 week after cessation of broad-spectrum antibiotics.  Continue Florastor.  Acute renal failure and hyponatremia: probably prerenal in origin.  Resolved with hydration.  Her ACEI was initially held but resumed at the time of discharge.    DM with hyperglycemia, hypoglycemic.  Appreciate diabetic educator assistance.  Concentrated insulin reduced to sliding-scale twice a day until she is eating normally.  Hyperbilirubinemia due to dehydration, resolved with IV fluids  Hypothyroidism, stable, cont levothyroxine  HTN, well controlled.   Hx of CAD with DES in Jan 2017, statin intolerant.  Continued home ticagrelor and aspirin.  Unclear why not on beta blocker, but will defer to PCP.  MCTD, stable, continue Plaquenil  Asthma, stable, continue Pulmicort  Chronic pain continued fentanyl 100 mcg patch  Continued oxycodone for breakthrough pain Continued gabapentin  Discharge Instructions  Discharge Instructions    Call MD for:  difficulty breathing, headache or visual disturbances    Complete by:  As directed   Call MD for:  extreme fatigue    Complete by:  As directed   Call MD for:  hives    Complete by:  As directed   Call MD for:  persistant  dizziness or light-headedness    Complete by:  As directed   Call MD for:  persistant nausea and vomiting    Complete by:  As directed   Call MD for:  severe uncontrolled pain    Complete by:  As directed   Call MD for:  temperature >100.4    Complete by:  As directed   Diet Carb Modified    Complete  by:  As directed   Discharge instructions    Complete by:  As directed   Please stop taking your lunchtime and evening insulin until your blood sugars are routinely greater than 200 and you are eating normally.  Please take keflex and bactrim until all the tabs are gone starting tomorrow morning.  Please take flagyl while on these antibiotics and for one week after you have stopped your keflex and bactrim   Increase activity slowly    Complete by:  As directed       Medication List    STOP taking these medications   ibuprofen 400 MG tablet Commonly known as:  ADVIL,MOTRIN     TAKE these medications   albuterol 108 (90 Base) MCG/ACT inhaler Commonly known as:  PROVENTIL HFA;VENTOLIN HFA Inhale 2 puffs into the lungs every 4 (four) hours as needed for wheezing or shortness of breath.   albuterol (2.5 MG/3ML) 0.083% nebulizer solution Commonly known as:  PROVENTIL Inhale 2.5 mLs into the lungs every 4 (four) hours as needed for wheezing or shortness of breath.   Alpha Lipoic Acid 200 MG Caps Take 600 mg by mouth 3 (three) times daily.   aspirin EC 81 MG tablet Take 81 mg by mouth daily.   b complex vitamins tablet Take 1 tablet by mouth daily.   beclomethasone 40 MCG/ACT inhaler Commonly known as:  QVAR ONE PUFF TWICE A DAY TO PREVENT COUGH OR WHEEZE. USE SPACER.   betamethasone dipropionate 0.05 % cream Commonly known as:  DIPROLENE Apply 1 application topically daily as needed (eczema).   BRILINTA 90 MG Tabs tablet Generic drug:  ticagrelor Take 90 mg by mouth 2 (two) times daily.   cephALEXin 500 MG capsule Commonly known as:  KEFLEX Take 1 capsule (500 mg total) by mouth 4 (four) times daily.   cetirizine 10 MG tablet Commonly known as:  ZYRTEC Take 10 mg by mouth daily as needed for allergies.   clotrimazole-betamethasone cream Commonly known as:  LOTRISONE Apply 1 application topically daily as needed (eczema).   diphenhydrAMINE 25 MG tablet Commonly known  as:  BENADRYL Take 1 tablet (25 mg total) by mouth every 6 (six) hours as needed for itching or allergies.   EASY TOUCH INSULIN SYRINGE 30G X 5/16" 0.5 ML Misc Generic drug:  Insulin Syringe-Needle U-100   enalapril 10 MG tablet Commonly known as:  VASOTEC Take 10 mg by mouth 2 (two) times daily.   EPINEPHrine 0.3 mg/0.3 mL Soaj injection Commonly known as:  EPIPEN 2-PAK USE AS DIRECTED FOR SEVERE ALLERGIC REACTION.   fentaNYL 100 MCG/HR Commonly known as:  DURAGESIC - dosed mcg/hr Place 100 mcg onto the skin every other day.   fluticasone 50 MCG/ACT nasal spray Commonly known as:  FLONASE Place 2 sprays into both nostrils daily as needed for allergies or rhinitis.   furosemide 40 MG tablet Commonly known as:  LASIX Take 40 mg by mouth daily as needed for fluid. What changed:  Another medication with the same name was removed. Continue taking this medication, and follow the directions you  see here.   gabapentin 100 MG capsule Commonly known as:  NEURONTIN Take 400-700 mg by mouth See admin instructions. Pt takes 400mg  in the morning, 400mg  at lunchtime and 700mg  in the evening   GLUCOMANNAN PO Take 700 mg by mouth 2 (two) times daily.   HUMULIN R 500 UNIT/ML injection Generic drug:  insulin regular human CONCENTRATED Inject 0-100 Units into the skin 4 (four) times daily -  before meals and at bedtime. TID w/ meals Cbgs <125 = no insulin          125-150 = 10 units using insulin syringe ( actual dose is 50 units)          151 - 200 = 15 units using insulin syringe ( actual dose is 75 units)          > 201 = 20 units using insulin syringe ( actual dose is 100 units)  QHS Cbgs <150 = no insulin          151-200= 5 units using insulin syringe ( actual dose is 25 units)          201 - 300 = 10 units using insulin syringe ( actual dose is 50 units)          > 300 = 15 units using insulin syringe ( actual dose is 75 units)   hydroxychloroquine 200 MG tablet Commonly known as:   PLAQUENIL Take 200 mg by mouth 2 (two) times daily.   levalbuterol 1.25 MG/0.5ML nebulizer solution Commonly known as:  XOPENEX Take 1 ampule by nebulization 4 (four) times daily as needed for wheezing or shortness of breath.   levothyroxine 25 MCG tablet Commonly known as:  SYNTHROID, LEVOTHROID Take 25 mcg by mouth See admin instructions. Take 225 mcg once daily on Monday-Friday. Take 200 mcg once daily on Saturday and Sunday.   levothyroxine 200 MCG tablet Commonly known as:  SYNTHROID, LEVOTHROID Take 200 mcg by mouth daily before breakfast.   LIPO-FLAVONOID PLUS PO Take 1 tablet by mouth 3 (three) times daily.   magnesium oxide 400 MG tablet Commonly known as:  MAG-OX Take 400 mg by mouth every evening.   metFORMIN 500 MG 24 hr tablet Commonly known as:  GLUCOPHAGE-XR Take 1,000 mg by mouth 2 (two) times daily.   metoprolol tartrate 25 MG tablet Commonly known as:  LOPRESSOR Take 25 mg by mouth 2 (two) times daily.   metroNIDAZOLE 500 MG tablet Commonly known as:  FLAGYL Take 1 tablet (500 mg total) by mouth every 8 (eight) hours.   milk thistle 175 MG tablet Take 175 mg by mouth 2 (two) times daily.   MUCINEX MAXIMUM STRENGTH 1200 MG Tb12 Generic drug:  Guaifenesin Take 1,200 mg by mouth 2 (two) times daily.   multivitamin tablet Take 1 tablet by mouth daily.   ondansetron 4 MG tablet Commonly known as:  ZOFRAN Take 1 tablet (4 mg total) by mouth every 8 (eight) hours as needed for nausea or vomiting.   Oxycodone HCl 10 MG Tabs Take 10 mg by mouth every 4 (four) hours as needed (for pain).   oxymetazoline 0.05 % nasal spray Commonly known as:  AFRIN Place 1 spray into both nostrils 2 (two) times daily as needed for congestion.   PREMARIN VA Place 1 application vaginally 3 (three) times a week. On Saturdays, Mondays, and Wednesday (cream)   promethazine 25 MG tablet Commonly known as:  PHENERGAN Take 25 mg by mouth every 6 (six) hours as needed for  nausea or vomiting.   saccharomyces boulardii 250 MG capsule Commonly known as:  FLORASTOR Take 1 capsule (250 mg total) by mouth 2 (two) times daily.   sulfamethoxazole-trimethoprim 800-160 MG tablet Commonly known as:  BACTRIM DS,SEPTRA DS Take 2 tablets by mouth 2 (two) times daily.   SYSTANE ULTRA OP Apply 1 drop to eye daily as needed (dry eyes).   vitamin A 59292 UNIT capsule Take 10,000 Units by mouth daily.   vitamin B-12 1000 MCG tablet Commonly known as:  CYANOCOBALAMIN Take 1,000 mcg by mouth daily.   Vitamin D (Ergocalciferol) 50000 units Caps capsule Commonly known as:  DRISDOL Take 1 capsule by mouth once a week. Monday.      Follow-up Information    SNIDER, CYNTHIA, MD Follow up in 1 week(s).   Specialty:  Infectious Diseases Contact information: [address] [place] [address] [phone]        Cheral Bay, MD Follow up in 1 week(s).   Specialty:  Family Medicine Why:  if unable to get appointment with Infectious Disease in next week. Contact information: [address] [facility] [address] [phone]          Allergies  Allergen Reactions  . Fish Allergy Anaphylaxis  . Fish Oil [Omega-3 Fatty Acids] Swelling    Throat swelling   . Gabapentin Other (See Comments)    Anxiety on high doses  . Ipratropium Shortness Of Breath  . Red Yeast Rice [Cholestin] Other (See Comments)    Muscle pain and severe joint pain.   . Atrovent Other (See Comments)    Wheezing becomes worse  . Crestor [Rosuvastatin] Other (See Comments)    Extreme joint pain Extreme joint pain, to this & other statins  . Shellfish Allergy   . Suprep [Na Sulfate-K Sulfate-Mg Sulf] Nausea And Vomiting  . Tape     Electrodes causes skin breakdown  . Penicillins Rash and Other (See Comments)    Unknown childhood reaction; Tolerates Keflex.   To undergo PCN challenge with allergist 02/2016    Consultations: Diabetic  educator   Procedures/Studies: Dg Ankle Complete Right  Result Date: 05/01/2016 CLINICAL DATA:  Right ankle bruising EXAM: RIGHT ANKLE - COMPLETE 3+ VIEW COMPARISON:  None. FINDINGS: Mild soft tissue swelling is noted. No acute fracture or dislocation is seen. Degenerative tarsal changes are noted. IMPRESSION: No acute bony abnormality did. Electronically Signed   By: Alcide Clever M.D.   On: 05/01/2016 14:30   Dg Chest Port 1 View  Result Date: 04/28/2016 CLINICAL DATA:  Acute onset of vomiting and fever. Right leg swelling and pain. Initial encounter. EXAM: PORTABLE CHEST 1 VIEW COMPARISON:  Chest radiograph from 09/28/2015 FINDINGS: The lungs are well-aerated. Vascular congestion is noted. Mild left basilar opacity may reflect atelectasis or mild pneumonia. There is no evidence of pleural effusion or pneumothorax. The cardiomediastinal silhouette is within normal limits. No acute osseous abnormalities are seen. IMPRESSION: Vascular congestion noted. Mild left basilar opacity may reflect atelectasis or mild pneumonia. Electronically Signed   By: Roanna Raider M.D.   On: 04/28/2016 19:38    Subjective: Improved redness and swelling of the right lower extremity.  Denies fevers, chills, nausea, vomiting.  Still having soft stools.    Discharge Exam: Vitals:   05/04/16 0622 05/04/16 0831  BP: (!) 159/69 (!) 142/55  Pulse: 72 81  Resp: 18 18  Temp: 98 F (36.7 C) 98.3 F (36.8 C)   Vitals:  05/03/16 2139 05/04/16 0622 05/04/16 0831 05/04/16 0928  BP:  (!) 159/69 (!) 142/55   Pulse: 78 72 81   Resp: 18 18 18    Temp:  98 F (36.7 C) 98.3 F (36.8 C)   TempSrc:  Oral Oral   SpO2: 98% 98% 97% 100%  Weight:      Height:        General exam:  Adult Female.  No acute distress.  HEENT:  NCAT, MMM Respiratory system: Clear to auscultation bilaterally Cardiovascular system: Regular rate and rhythm, normal S1/S2. No murmurs, rubs, gallops or clicks.  Warm extremities Gastrointestinal  system: Normal active bowel sounds, soft, nondistended, nontender. MSK:  Normal tone and bulk, 2+ pitting edema of the right lower extremity with erythema that is pink in color along the calf extending up to the knee.  Area along medial thigh is much less pink compared to yesterday.  Still tender to touch along the shin and calf. Neuro:  Grossly intact   The results of significant diagnostics from this hospitalization (including imaging, microbiology, ancillary and laboratory) are listed below for reference.     Microbiology: Recent Results (from the past 240 hour(s))  Culture, blood (Routine x 2)     Status: None   Collection Time: 04/28/16  6:50 PM  Result Value Ref Range Status   Specimen Description BLOOD LEFT ANTECUBITAL  Final   Special Requests   Final    BOTTLES DRAWN AEROBIC AND ANAEROBIC 5CC BOTH BOTTLES   Culture   Final    NO GROWTH 5 DAYS Performed at [facility]    Report Status 05/03/2016 FINAL  Final  Culture, blood (Routine x 2)     Status: None   Collection Time: 04/28/16  7:10 PM  Result Value Ref Range Status   Specimen Description BLOOD RIGHT HAND  Final   Special Requests IN PEDIATRIC BOTTLE 1CC  Final   Culture   Final    NO GROWTH 5 DAYS Performed at [facility]    Report Status 05/03/2016 FINAL  Final  Urine culture     Status: Abnormal   Collection Time: 04/28/16  8:12 PM  Result Value Ref Range Status   Specimen Description URINE, CLEAN CATCH  Final   Special Requests NONE  Final   Culture MULTIPLE SPECIES PRESENT, SUGGEST RECOLLECTION (A)  Final   Report Status 04/30/2016 FINAL  Final  MRSA PCR Screening     Status: Abnormal   Collection Time: 04/28/16 11:24 PM  Result Value Ref Range Status   MRSA by PCR POSITIVE (A) NEGATIVE Final    Comment:        The GeneXpert MRSA Assay (FDA approved for NASAL specimens only), is one component of a comprehensive MRSA colonization surveillance program. It is not intended to diagnose  MRSA infection nor to guide or monitor treatment for MRSA infections. RESULT CALLED TO, READ BACK BY AND VERIFIED WITH: THOMLINSON,RN @0459  04/29/16 MKELLY   C difficile quick scan w PCR reflex     Status: Abnormal   Collection Time: 04/29/16  2:37 PM  Result Value Ref Range Status   C Diff antigen POSITIVE (A) NEGATIVE Final   C Diff toxin NEGATIVE NEGATIVE Final   C Diff interpretation Results are indeterminate. See PCR results.  Final  Clostridium Difficile by PCR     Status: Abnormal   Collection Time: 04/29/16  2:37 PM  Result Value Ref Range Status   Toxigenic C Difficile by pcr POSITIVE (A) NEGATIVE  Final  Culture, Urine     Status: None   Collection Time: 04/30/16 12:26 PM  Result Value Ref Range Status   Specimen Description URINE, RANDOM  Final   Special Requests NONE  Final   Culture NO GROWTH  Final   Report Status 05/01/2016 FINAL  Final     Labs: BNP (last 3 results) No results for input(s): BNP in the last 8760 hours. Basic Metabolic Panel:  Recent Labs Lab 04/28/16 1850 04/29/16 0250 04/30/16 0309 05/03/16 0344 05/04/16 0522  NA 129* 134* 139 137 140  K 3.9 3.9 3.6 3.9 3.8  CL 91* 105 104 103 100*  CO2 23 22 24 26  32  GLUCOSE 308* 247* 122* 189* 135*  BUN 13 9 10 13 11   CREATININE 1.12* 0.77 0.72 0.78 0.80  CALCIUM 9.5 8.3* 8.9 9.3 9.5   Liver Function Tests:  Recent Labs Lab 04/28/16 1850 04/29/16 0250 04/30/16 0309  AST 34 20 24  ALT 29 22 26   ALKPHOS 67 49 49  BILITOT 1.5* 0.9  0.9 0.4  PROT 7.9 5.9* 5.9*  ALBUMIN 4.0 2.8* 2.7*   No results for input(s): LIPASE, AMYLASE in the last 168 hours. No results for input(s): AMMONIA in the last 168 hours. CBC:  Recent Labs Lab 04/28/16 1850 04/29/16 0250 04/30/16 0309 05/03/16 0344 05/04/16 0522  WBC 26.6* 15.1* 14.2* 9.8 13.6*  NEUTROABS 22.9*  --   --   --   --   HGB 15.4* 12.1 11.9* 13.1 13.9  HCT 45.9 38.5 38.0 41.6 45.1  MCV 84.8 87.9 88.4 88.5 89.3  PLT 234 160 166 273 319    Cardiac Enzymes: No results for input(s): CKTOTAL, CKMB, CKMBINDEX, TROPONINI in the last 168 hours. BNP: Invalid input(s): POCBNP CBG:  Recent Labs Lab 05/03/16 1123 05/03/16 1544 05/03/16 2110 05/04/16 0618 05/04/16 1206  GLUCAP 216* 216* 138* 123* 244*   D-Dimer No results for input(s): DDIMER in the last 72 hours. Hgb A1c No results for input(s): HGBA1C in the last 72 hours. Lipid Profile No results for input(s): CHOL, HDL, LDLCALC, TRIG, CHOLHDL, LDLDIRECT in the last 72 hours. Thyroid function studies No results for input(s): TSH, T4TOTAL, T3FREE, THYROIDAB in the last 72 hours.  Invalid input(s): FREET3 Anemia work up No results for input(s): VITAMINB12, FOLATE, FERRITIN, TIBC, IRON, RETICCTPCT in the last 72 hours. Urinalysis    Component Value Date/Time   COLORURINE YELLOW 04/28/2016 2012   APPEARANCEUR CLEAR 04/28/2016 2012   LABSPEC 1.004 (L) 04/28/2016 2012   PHURINE 6.5 04/28/2016 2012   GLUCOSEU 100 (A) 04/28/2016 2012   HGBUR NEGATIVE 04/28/2016 2012   BILIRUBINUR NEGATIVE 04/28/2016 2012   KETONESUR NEGATIVE 04/28/2016 2012   PROTEINUR NEGATIVE 04/28/2016 2012   UROBILINOGEN 0.2 02/10/2015 1835   NITRITE NEGATIVE 04/28/2016 2012   LEUKOCYTESUR NEGATIVE 04/28/2016 2012   Sepsis Labs Invalid input(s): PROCALCITONIN,  WBC,  LACTICIDVEN   Time coordinating discharge: Over 30 minutes  SIGNED:   Renae FickleSHORT, Aylssa Herrig, MD  [facility] 05/04/2016, 3:02 PM Pager   If 7PM-7AM, please contact night-coverage [email or web address] Password [email or web address]

## 2016-05-08 ENCOUNTER — Other Ambulatory Visit (HOSPITAL_BASED_OUTPATIENT_CLINIC_OR_DEPARTMENT_OTHER): Payer: Self-pay | Admitting: Specialist

## 2016-05-08 ENCOUNTER — Other Ambulatory Visit (HOSPITAL_COMMUNITY): Payer: Self-pay | Admitting: Specialist

## 2016-05-08 DIAGNOSIS — M5416 Radiculopathy, lumbar region: Secondary | ICD-10-CM

## 2016-05-08 DIAGNOSIS — M5430 Sciatica, unspecified side: Secondary | ICD-10-CM

## 2016-05-16 ENCOUNTER — Encounter (HOSPITAL_COMMUNITY)
Admission: RE | Admit: 2016-05-16 | Discharge: 2016-05-16 | Disposition: A | Discharge status: Home / Self Care | Payer: BLUE CROSS/BLUE SHIELD | Source: Ambulatory Visit | Attending: Family Medicine | Admitting: Family Medicine

## 2016-05-16 ENCOUNTER — Encounter: Payer: Self-pay | Admitting: Cardiology

## 2016-05-16 DIAGNOSIS — Z01812 Encounter for preprocedural laboratory examination: Secondary | ICD-10-CM | POA: Diagnosis present

## 2016-05-16 HISTORY — DX: Hypothyroidism, unspecified: E03.9

## 2016-05-16 LAB — BASIC METABOLIC PANEL
Anion gap: 6 (ref 5–15)
BUN: 14 mg/dL (ref 6–20)
CO2: 25 mmol/L (ref 22–32)
Calcium: 9.7 mg/dL (ref 8.9–10.3)
Chloride: 105 mmol/L (ref 101–111)
Creatinine, Ser: 0.92 mg/dL (ref 0.44–1.00)
GFR calc Af Amer: >60 mL/min (ref >60)
GFR calc non Af Amer: >60 mL/min (ref >60)
Glucose, Bld: 169 mg/dL — ABNORMAL HIGH (ref 65–99)
Potassium: 4.6 mmol/L (ref 3.5–5.1)
Sodium: 136 mmol/L (ref 135–145)

## 2016-05-16 LAB — CBC
HCT: 43.3 % (ref 36.0–46.0)
Hemoglobin: 13.3 g/dL (ref 12.0–15.0)
MCH: 27.9 pg (ref 26.0–34.0)
MCHC: 30.7 g/dL (ref 30.0–36.0)
MCV: 91 fL (ref 78.0–100.0)
Platelets: 270 10*3/uL (ref 150–400)
RBC: 4.76 MIL/uL (ref 3.87–5.11)
RDW: 14.5 % (ref 11.5–15.5)
WBC: 10.2 10*3/uL (ref 4.0–10.5)

## 2016-05-16 LAB — GLUCOSE, CAPILLARY: Glucose-Capillary: 178 mg/dL — ABNORMAL HIGH (ref 65–99)

## 2016-05-16 NOTE — Progress Notes (Signed)
Requested H&P from Dr.Edward Hill with [facility] in [place]

## 2016-05-16 NOTE — Progress Notes (Addendum)
Cardiologist is Dr.Traci Turner,sees every 6 months.Last visit 11/2015  Medical Md is Maurene Capes but is changing on 05/22/16 to Dr.Copeland  Echo multiple reports in epic   Stress test in epic from 2016  Heart cath report in epic from 2017  EKG in epic from 04-28-16  CXR in epic from 09-03-15  Sleep study in epic from 2017

## 2016-05-16 NOTE — Pre-Procedure Instructions (Signed)
Geniene Acy  05/16/2016      [facility] [address] [place], [place] - [address] [place] [address] Phone: [phone] Fax: [phone]    Your procedure is scheduled on Tues, Sept 12 @ 8:00 AM  Report to [facility] Admitting at 6:00 AM  Call this number if you have problems the morning of surgery:  [phone]   Remember:  Do not eat food or drink liquids after midnight.  Take these medicines the morning of surgery with A SIP OF WATER Albuterol<Bring Your Inhaler With You>,QVAR,Keflex(Cephalexin),Zyrtec(Cetirizine-if needed),Pain Patch,Gabapentin(Neurontin), Synthroid(Levothyroxine),Metoprolol(Lopressor),Afrin(if needed),Phenergan(Promethazine-if needed),and Bactrim             No Goody's,BC's,Aleve,Advil,Motrin,Ibuprofen,Fish Oil,or any Herbal Medications.      How to Manage Your Diabetes Before and After Surgery  Why is it important to control my blood sugar before and after surgery? . Improving blood sugar levels before and after surgery helps healing and can limit problems. . A way of improving blood sugar control is eating a healthy diet by: o  Eating less sugar and carbohydrates o  Increasing activity/exercise o  Talking with your doctor about reaching your blood sugar goals . High blood sugars (greater than 180 mg/dL) can raise your risk of infections and slow your recovery, so you will need to focus on controlling your diabetes during the weeks before surgery. . Make sure that the doctor who takes care of your diabetes knows about your planned surgery including the date and location.  How do I manage my blood sugar before surgery? . Check your blood sugar at least 4 times a day, starting 2 days before surgery, to make sure that the level is not too high or low. o Check your blood sugar the morning of your surgery when you wake up and every 2 hours until you get to the Short Stay unit. . If your blood sugar is less  than 70 mg/dL, you will need to treat for low blood sugar: o Do not take insulin. o Treat a low blood sugar (less than 70 mg/dL) with  cup of clear juice (cranberry or apple), 4 glucose tablets, OR glucose gel. o Recheck blood sugar in 15 minutes after treatment (to make sure it is greater than 70 mg/dL). If your blood sugar is not greater than 70 mg/dL on recheck, call [phone] for further instructions. . Report your blood sugar to the short stay nurse when you get to Short Stay.  . If you are admitted to the hospital after surgery: o Your blood sugar will be checked by the staff and you will probably be given insulin after surgery (instead of oral diabetes medicines) to make sure you have good blood sugar levels. o The goal for blood sugar control after surgery is 80-180 mg/dL.              WHAT DO I DO ABOUT MY DIABETES MEDICATION?   Marland Kitchen Do not take oral diabetes medicines (pills) the morning of surgery.           . The day of surgery, do not take other diabetes injectables, including Byetta (exenatide), Bydureon (exenatide ER), Victoza (liraglutide), or Trulicity (dulaglutide).  . If your CBG is greater than 220 mg/dL, you may take  of your sliding scale (correction) dose of insulin.  Other Instructions:          Patient Signature:  Date:   Nurse Signature:  Date:   Reviewed and Endorsed  by [facility] Patient Education Committee, August 2015   Do not wear jewelry, make-up or nail polish.  Do not wear lotions, powders, or perfumes, or deoderant.  Do not shave 48 hours prior to surgery.    Do not bring valuables to the hospital.  [facility] is not responsible for any belongings or valuables.  Contacts, dentures or bridgework may not be worn into surgery.  Leave your suitcase in the car.  After surgery it may be brought to your room.  For patients admitted to the hospital, discharge time will be determined by your treatment team.  Patients discharged  the day of surgery will not be allowed to drive home.    [facility]  Before surgery, you can play an important role.  Because skin is not sterile, your skin needs to be as free of germs as possible.  You can reduce the number of germs on you skin by washing with CHG (chlorahexidine gluconate) soap before surgery.  CHG is an antiseptic cleaner which kills germs and bonds with the skin to continue killing germs even after washing.  Please DO NOT use if you have an allergy to CHG or antibacterial soaps.  If your skin becomes reddened/irritated stop using the CHG and inform your nurse when you arrive at Short Stay.  Do not shave (including legs and underarms) for at least 48 hours prior to the first CHG shower.  You may shave your face.  Please follow these instructions carefully:   1.  Shower with CHG Soap the night before surgery and the                                morning of Surgery.  2.  If you choose to wash your hair, wash your hair first as usual with your       normal shampoo.  3.  After you shampoo, rinse your hair and body thoroughly to remove the                      Shampoo.  4.  Use CHG as you would any other liquid soap.  You can apply chg directly       to the skin and wash gently with scrungie or a clean washcloth.  5.  Apply the CHG Soap to your body ONLY FROM THE NECK DOWN.        Do not use on open wounds or open sores.  Avoid contact with your eyes,       ears, mouth and genitals (private parts).  Wash genitals (private parts)       with your normal soap.  6.  Wash thoroughly, paying special attention to the area where your surgery        will be performed.  7.  Thoroughly rinse your body with warm water from the neck down.  8.  DO NOT shower/wash with your normal soap after using and rinsing off       the CHG Soap.  9.  Pat yourself dry with a clean towel.            10.  Wear clean pajamas.            11.  Place clean sheets on your  bed the night of your first shower and do not        sleep with pets.  Day  of Surgery  Do not apply any lotions/deoderants the morning of surgery.  Please wear clean clothes to the hospital/surgery center.

## 2016-05-17 ENCOUNTER — Ambulatory Visit (HOSPITAL_COMMUNITY): Payer: BLUE CROSS/BLUE SHIELD | Admitting: Vascular Surgery

## 2016-05-17 LAB — HEMOGLOBIN A1C
Hgb A1c MFr Bld: 8.3 % — ABNORMAL HIGH (ref 4.8–5.6)
Mean Plasma Glucose: 192 mg/dL

## 2016-05-17 NOTE — Progress Notes (Signed)
Anesthesia Chart Review: Patient is a 55 year old female scheduled for MRI L-spine under anesthesia care on 05/23/16. Imaging was ordered by neurologist Dr. Lawrence Marseilles [facility]). H&P paperwork is scanned under the Media tab under Radiology Order. She also has an upcoming new patient visit with Dr. Janett Billow Copland at [facility]. (She had previously seen Dr. 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 BMI is consistent with morbid obesity. She was admitted to [facility] 04/28/16-05/04/16 for sepsis due to RLE cellulitis. She was treated with IV antibiotics, and ID was consulted. U/S was negative for DVT. 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 difficile antigen positive this admission. Also admitted for RLE cellulitis/right heel ulcer 03/09/16-03/11/16.  Cardiologist is Dr. Fransico Him, last visit 11/26/15. She was doing well following 09/2015 PCI. Next visit scheduled for 05/31/16. Endocrinologist is Dr. Iran Planas with [facility].  Meds include albuterol, aspirin 81 mg, Qvar, Brilinta, Keflex, Zyrtec, Benadryl, enalapril, conjugated estrogens, Duragesic patch, Flonase, Lasix, Neurontin, plaquenil, Humulin R-500, Xopenex, levothyroxine, mag oxide, metformin, Lopressor, Flagyl, milk thistle, Zofran, Batrim DS, Afrin, oxycodone, Afrin, Florastor.  She is statin intolerant.  BP (!) 143/66   Pulse 80   Temp 36.6 C   Resp 18   Ht _0  (1.575 m)   Wt 265 lb 8 oz (120.4 kg)   SpO2 99%   BMI 48.56 kg/m    04/28/16 EKG (done in ED for sepsis admission): ST at 126, inferior infarct (age undetermined), anterolateral infarct (old). Inferior infarct and anterior infarct changes noted on 09/03/15 tracing. Poor r wave  progression in V5-6 is new since then.   According to 10/28/15 note by Dr. Wyline Copas (Care Everywhere): 09/2015 event monitor for evaluation of palpitations showed: "No arrhythmia noted on tele monitor even when she has symptoms. Will observe for now."  10/01/15 Cardiac cath [facility], Dr. Waynetta Sandy; Care Everywhere; done due to abnormal stress test): LMCA: Normal. CVE:LFYB LAD: 30% stenosis. LCx: Normal. RCA: Lesion on Mid RCA: Mid subsection.85% stenosis 10 mm length reduced to 0%. - 2.5 mm X 12 mm Trek RX. 1 inflation(s) to a max pressure of: 8 atm. - Xience Alpine 2.75x63m Everolimus DES. 4 inflation(s) to a max pressure of: 12 atm. Lesion on Dist RCA: Mid subsection.95% stenosis 12 mm length reduced to 0%.  - 2.5 mm X 12 mm Trek RX. 1 inflation(s) to a max pressure of: 8 atm. - Xience Alpine 2.75x167mEverolimus DES. 1 inflation(s) to a max pressure of: 10 atm. LV function assessed as: Normal. EF 65% Impression: 1. Severe Mid and Distal RCA lesions 2. Mild disease elsewhere 3. LV systolic function is normal Diagnostic Recommendations: PCI to Mid and Distal RCA Interventional Summary: Successful PCI / Xience Drug Eluting Stent of the mid and distal Right Coronary Artery. Interventional Recommendations: Dual anti-platelet therapy with Ticagrelor and Aspirin 81 mg for at least 12 months is recommended.  12/16/15 Echo: Study Conclusions - Left ventricle: The cavity size was normal. Systolic function was   normal. The estimated ejection fraction was in the range of 60%   to 65%. Wall motion was normal; there were no regional wall   motion abnormalities. Left ventricular diastolic function   parameters were normal. - Aortic valve: Poorly visualized. Trileaflet; normal thickness,  mildly calcified leaflets.  04/28/16 1V CXR: IMPRESSION: Vascular congestion noted. Mild left basilar opacity may reflect atelectasis or mild pneumonia.  03/28/16 Spirometry (scanned under Procedure  tab): FVC 2.78 (87%), FEV1 2.22 (88%), FEF 25-75 2.19 (88%).  02/13/16 Sleep study (found under Media tab): IMPRESSIONS - Mild obstructive sleep apnea occurred during this study (AHI = 5.9/h). - No significant central sleep apnea occurred during this study (CAI = 0.0/h). - Mild oxygen desaturation was noted during this study (Min O2 = 86.00%). - The patient snored with Soft snoring volume. - No cardiac abnormalities were noted during this study. - Mild periodic limb movements of sleep occurred during the study. No significant associated arousals. RECOMMENDATIONS - Positional therapy avoiding supine position during sleep. - Very mild obstructive sleep apnea. Return to discuss treatment options. - Avoid alcohol, sedatives and other CNS depressants that may worsen sleep apnea and disrupt normal sleep architecture. - Sleep hygiene should be reviewed to assess factors that may improve sleep quality. - Weight management and regular exercise should be initiated or continued if appropriate.  Preoperative labs noted. A1c 8.3 (up from 7.7 on 03/09/16; Care Everywhere).   I called and spoke with patient. She has been instructed by Dr. Thompson Caul staff on how to manage U-500 while NPO. She is aware not to stop her Brilinta and ASA (DES < 12 months ago). She was doing well at her most recent visit with Dr. Radford Pax. If no acute changes then I would anticipate that she can proceed with MRI as planned.   George Hugh [facility] Short Stay Center/Anesthesiology Phone [phone] 05/18/2016 2:44 PM

## 2016-05-22 ENCOUNTER — Ambulatory Visit: Payer: BLUE CROSS/BLUE SHIELD | Admitting: Family Medicine

## 2016-05-23 ENCOUNTER — Ambulatory Visit (HOSPITAL_COMMUNITY): Admission: RE | Admit: 2016-05-23 | Payer: BLUE CROSS/BLUE SHIELD | Source: Ambulatory Visit

## 2016-05-23 ENCOUNTER — Encounter (HOSPITAL_COMMUNITY): Payer: Self-pay

## 2016-05-23 ENCOUNTER — Encounter (HOSPITAL_COMMUNITY): Payer: Self-pay | Admitting: Certified Registered Nurse Anesthetist

## 2016-05-23 ENCOUNTER — Ambulatory Visit (HOSPITAL_COMMUNITY)
Admission: RE | Admit: 2016-05-23 | Discharge: 2016-05-23 | Disposition: A | Discharge status: Home / Self Care | Payer: BLUE CROSS/BLUE SHIELD | Source: Ambulatory Visit | Attending: Specialist | Admitting: Specialist

## 2016-05-23 DIAGNOSIS — E119 Type 2 diabetes mellitus without complications: Secondary | ICD-10-CM | POA: Insufficient documentation

## 2016-05-23 DIAGNOSIS — M5441 Lumbago with sciatica, right side: Secondary | ICD-10-CM | POA: Insufficient documentation

## 2016-05-23 DIAGNOSIS — Z955 Presence of coronary angioplasty implant and graft: Secondary | ICD-10-CM | POA: Insufficient documentation

## 2016-05-23 DIAGNOSIS — M419 Scoliosis, unspecified: Secondary | ICD-10-CM | POA: Insufficient documentation

## 2016-05-23 DIAGNOSIS — Z87891 Personal history of nicotine dependence: Secondary | ICD-10-CM | POA: Insufficient documentation

## 2016-05-23 DIAGNOSIS — M5136 Other intervertebral disc degeneration, lumbar region: Secondary | ICD-10-CM | POA: Insufficient documentation

## 2016-05-23 DIAGNOSIS — Z7984 Long term (current) use of oral hypoglycemic drugs: Secondary | ICD-10-CM | POA: Insufficient documentation

## 2016-05-23 DIAGNOSIS — I2510 Atherosclerotic heart disease of native coronary artery without angina pectoris: Secondary | ICD-10-CM | POA: Insufficient documentation

## 2016-05-23 DIAGNOSIS — I509 Heart failure, unspecified: Secondary | ICD-10-CM | POA: Insufficient documentation

## 2016-05-23 DIAGNOSIS — I110 Hypertensive heart disease with heart failure: Secondary | ICD-10-CM | POA: Diagnosis not present

## 2016-05-23 DIAGNOSIS — J45909 Unspecified asthma, uncomplicated: Secondary | ICD-10-CM | POA: Insufficient documentation

## 2016-05-23 DIAGNOSIS — K219 Gastro-esophageal reflux disease without esophagitis: Secondary | ICD-10-CM | POA: Insufficient documentation

## 2016-05-23 LAB — GLUCOSE, CAPILLARY: Glucose-Capillary: 122 mg/dL — ABNORMAL HIGH (ref 65–99)

## 2016-05-23 MED ORDER — METOPROLOL TARTRATE 12.5 MG HALF TABLET
ORAL_TABLET | ORAL | Status: AC
  Administered 2016-05-23: 25 mg via ORAL
  Filled 2016-05-23: qty 2

## 2016-05-23 MED ORDER — METOPROLOL TARTRATE 25 MG PO TABS
25.0000 mg | ORAL_TABLET | Freq: Once | ORAL | Status: AC
  Administered 2016-05-23: 25 mg via ORAL
  Filled 2016-05-23: qty 1

## 2016-05-23 MED ORDER — LACTATED RINGERS IV SOLN
INTRAVENOUS | Status: DC
  Administered 2016-05-23: 08:00:00 via INTRAVENOUS

## 2016-05-23 NOTE — Progress Notes (Signed)
Patient states she is supposed to be having a neck MRI as well. Looked at order and confirmed it was not ordered. Spoke with MRI they advised that they were unable to call office and get order due to needing pre approval from insurance. Patient notified and verbalized understanding.

## 2016-05-23 NOTE — Progress Notes (Signed)
Patient reported chest pain to Maralyn Sago, CRNA. EKG performed. Patient reports onset of CP last night while at rest in the right sternal boarder that is achy in nature. Patient reports that it increases with palpation. Reports that she is short of breath but that she has chronic intermittent shortness of breath. Dr. Maple Hudson notified by Maralyn Sago, CRNA. Patient placed on monitor NSR on monitor. Patient placed on 2 lpm oxygen.

## 2016-05-23 NOTE — Progress Notes (Signed)
Patient reports taking metoprolol last night at 2300. Patient reports that she did not take it this am due to not wanting it to be to close together. Spoke to Dr. Armanda Magic. Ok to give this morning.

## 2016-05-23 NOTE — Anesthesia Preprocedure Evaluation (Signed)
Anesthesia Evaluation    Reviewed: Allergy & Precautions, NPO status , Patient's Chart, lab work & pertinent test results  Airway        Dental  (+) Dental Advisory Given   Pulmonary shortness of breath, asthma , former smoker,           Cardiovascular hypertension, Pt. on medications + CAD, + Cardiac Stents and +CHF       Neuro/Psych    GI/Hepatic GERD  ,  Endo/Other  diabetes, Type 2, Oral Hypoglycemic AgentsHypothyroidism   Renal/GU      Musculoskeletal   Abdominal   Peds  Hematology   Anesthesia Other Findings   Reproductive/Obstetrics                             Anesthesia Physical Anesthesia Plan  ASA: III  Anesthesia Plan: General   Post-op Pain Management:    Induction: Intravenous  Airway Management Planned: Oral ETT  Additional Equipment:   Intra-op Plan:   Post-operative Plan: Extubation in OR  Informed Consent: I have reviewed the patients History and Physical, chart, labs and discussed the procedure including the risks, benefits and alternatives for the proposed anesthesia with the patient or authorized representative who has indicated his/her understanding and acceptance.   Dental advisory given  Plan Discussed with: CRNA, Anesthesiologist and Surgeon  Anesthesia Plan Comments:         Anesthesia Quick Evaluation

## 2016-05-24 ENCOUNTER — Other Ambulatory Visit: Payer: Self-pay | Admitting: *Deleted

## 2016-05-24 MED ORDER — METOPROLOL TARTRATE 25 MG PO TABS: 25.0000 mg | ORAL_TABLET | Freq: Two times a day (BID) | ORAL | 1 refills | Status: DC

## 2016-05-25 ENCOUNTER — Other Ambulatory Visit (HOSPITAL_COMMUNITY): Payer: BLUE CROSS/BLUE SHIELD

## 2016-05-25 ENCOUNTER — Ambulatory Visit (HOSPITAL_COMMUNITY)
Admission: RE | Admit: 2016-05-25 | Discharge: 2016-05-25 | Disposition: A | Discharge status: Home / Self Care | Payer: BLUE CROSS/BLUE SHIELD | Source: Ambulatory Visit | Attending: Specialist | Admitting: Specialist

## 2016-05-25 ENCOUNTER — Ambulatory Visit (HOSPITAL_COMMUNITY): Payer: BLUE CROSS/BLUE SHIELD | Admitting: Certified Registered Nurse Anesthetist

## 2016-05-25 ENCOUNTER — Encounter (HOSPITAL_COMMUNITY)
Admission: RE | Disposition: A | Discharge status: Home / Self Care | Payer: Self-pay | Source: Ambulatory Visit | Attending: Specialist

## 2016-05-25 ENCOUNTER — Encounter (HOSPITAL_COMMUNITY): Admission: RE | Disposition: A | Discharge status: Home / Self Care | Payer: Self-pay | Source: Ambulatory Visit

## 2016-05-25 ENCOUNTER — Encounter (HOSPITAL_COMMUNITY): Payer: Self-pay | Admitting: Certified Registered Nurse Anesthetist

## 2016-05-25 DIAGNOSIS — E039 Hypothyroidism, unspecified: Secondary | ICD-10-CM | POA: Diagnosis not present

## 2016-05-25 DIAGNOSIS — M4726 Other spondylosis with radiculopathy, lumbar region: Secondary | ICD-10-CM | POA: Diagnosis not present

## 2016-05-25 DIAGNOSIS — I509 Heart failure, unspecified: Secondary | ICD-10-CM | POA: Diagnosis not present

## 2016-05-25 DIAGNOSIS — Z794 Long term (current) use of insulin: Secondary | ICD-10-CM | POA: Diagnosis not present

## 2016-05-25 DIAGNOSIS — Z79899 Other long term (current) drug therapy: Secondary | ICD-10-CM | POA: Diagnosis not present

## 2016-05-25 DIAGNOSIS — M5116 Intervertebral disc disorders with radiculopathy, lumbar region: Secondary | ICD-10-CM | POA: Diagnosis not present

## 2016-05-25 DIAGNOSIS — I110 Hypertensive heart disease with heart failure: Secondary | ICD-10-CM | POA: Insufficient documentation

## 2016-05-25 DIAGNOSIS — M5441 Lumbago with sciatica, right side: Secondary | ICD-10-CM | POA: Diagnosis not present

## 2016-05-25 DIAGNOSIS — E119 Type 2 diabetes mellitus without complications: Secondary | ICD-10-CM | POA: Insufficient documentation

## 2016-05-25 DIAGNOSIS — M797 Fibromyalgia: Secondary | ICD-10-CM | POA: Diagnosis not present

## 2016-05-25 DIAGNOSIS — M5430 Sciatica, unspecified side: Secondary | ICD-10-CM

## 2016-05-25 DIAGNOSIS — M545 Low back pain: Secondary | ICD-10-CM | POA: Diagnosis present

## 2016-05-25 DIAGNOSIS — I2510 Atherosclerotic heart disease of native coronary artery without angina pectoris: Secondary | ICD-10-CM | POA: Diagnosis not present

## 2016-05-25 DIAGNOSIS — Z87891 Personal history of nicotine dependence: Secondary | ICD-10-CM | POA: Insufficient documentation

## 2016-05-25 DIAGNOSIS — J45909 Unspecified asthma, uncomplicated: Secondary | ICD-10-CM | POA: Insufficient documentation

## 2016-05-25 DIAGNOSIS — Z6841 Body Mass Index (BMI) 40.0 and over, adult: Secondary | ICD-10-CM | POA: Diagnosis not present

## 2016-05-25 DIAGNOSIS — M419 Scoliosis, unspecified: Secondary | ICD-10-CM | POA: Diagnosis not present

## 2016-05-25 DIAGNOSIS — K219 Gastro-esophageal reflux disease without esophagitis: Secondary | ICD-10-CM | POA: Insufficient documentation

## 2016-05-25 DIAGNOSIS — M5416 Radiculopathy, lumbar region: Secondary | ICD-10-CM

## 2016-05-25 HISTORY — PX: RADIOLOGY WITH ANESTHESIA: SHX6223

## 2016-05-25 LAB — GLUCOSE, CAPILLARY
Glucose-Capillary: 151 mg/dL — ABNORMAL HIGH (ref 65–99)
Glucose-Capillary: 128 mg/dL — ABNORMAL HIGH (ref 65–99)

## 2016-05-25 SURGERY — RADIOLOGY WITH ANESTHESIA
Anesthesia: General

## 2016-05-25 MED ORDER — LACTATED RINGERS IV SOLN
INTRAVENOUS | Status: DC | PRN
  Administered 2016-05-25: 07:00:00 via INTRAVENOUS

## 2016-05-25 NOTE — Anesthesia Procedure Notes (Signed)
Procedure Name: Intubation Date/Time: 05/25/2016 8:25 AM Performed by: Rise Patience T Pre-anesthesia Checklist: Patient identified, Emergency Drugs available, Suction available and Patient being monitored Patient Re-evaluated:Patient Re-evaluated prior to inductionOxygen Delivery Method: Circle System Utilized Preoxygenation: Pre-oxygenation with 100% oxygen Intubation Type: IV induction Ventilation: Mask ventilation without difficulty Laryngoscope Size: Miller and 2 Grade View: Grade I Tube type: Oral Tube size: 7.5 mm Number of attempts: 1 Airway Equipment and Method: Stylet and Oral airway Placement Confirmation: ETT inserted through vocal cords under direct vision,  positive ETCO2 and breath sounds checked- equal and bilateral Secured at: 22 cm Tube secured with: Tape Dental Injury: Teeth and Oropharynx as per pre-operative assessment

## 2016-05-25 NOTE — Transfer of Care (Signed)
Immediate Anesthesia Transfer of Care Note  Patient: [name]  Procedure(s) Performed: Procedure(s): RADIOLOGY WITH ANESTHESIA (N/A)  Patient Location: PACU  Anesthesia Type:General  Level of Consciousness: awake, alert  and oriented  Airway & Oxygen Therapy: Patient Spontanous Breathing  Post-op Assessment: Report given to RN, Post -op Vital signs reviewed and stable and Patient moving all extremities X 4  Post vital signs: Reviewed and stable  Last Vitals: There were no vitals filed for this visit.  Last Pain:  Vitals:   05/25/16 0730  PainSc: 7          Complications: No apparent anesthesia complications

## 2016-05-25 NOTE — Progress Notes (Signed)
Report given to robin roberts rn as caregiver 

## 2016-05-25 NOTE — Anesthesia Preprocedure Evaluation (Addendum)
Anesthesia Evaluation  Patient identified by MRN, date of birth, ID band Patient awake    Reviewed: Allergy & Precautions, NPO status , Patient's Chart, lab work & pertinent test results  Airway Mallampati: III  TM Distance: >3 FB Neck ROM: Full    Dental  (+) Dental Advisory Given, Poor Dentition, Missing, Chipped,    Pulmonary shortness of breath, asthma , former smoker,    breath sounds clear to auscultation       Cardiovascular hypertension, Pt. on medications + CAD and +CHF   Rhythm:Regular     Neuro/Psych    GI/Hepatic GERD  Medicated,  Endo/Other  diabetes, Type 2, Insulin Dependent, Oral Hypoglycemic AgentsHypothyroidism Morbid obesity  Renal/GU      Musculoskeletal  (+) Arthritis , Fibromyalgia -  Abdominal   Peds  Hematology   Anesthesia Other Findings   Reproductive/Obstetrics                          Anesthesia Physical Anesthesia Plan  ASA: III  Anesthesia Plan: General   Post-op Pain Management:    Induction: Intravenous  Airway Management Planned: Oral ETT  Additional Equipment: None  Intra-op Plan:   Post-operative Plan: Extubation in OR  Informed Consent: I have reviewed the patients History and Physical, chart, labs and discussed the procedure including the risks, benefits and alternatives for the proposed anesthesia with the patient or authorized representative who has indicated his/her understanding and acceptance.   Dental advisory given  Plan Discussed with: CRNA, Anesthesiologist and Surgeon  Anesthesia Plan Comments:        Anesthesia Quick Evaluation

## 2016-05-26 ENCOUNTER — Encounter (HOSPITAL_COMMUNITY): Payer: Self-pay | Admitting: Radiology

## 2016-05-26 NOTE — Anesthesia Postprocedure Evaluation (Signed)
Anesthesia Post Note  Patient: [name]  Procedure(s) Performed: Procedure(s) (LRB): RADIOLOGY WITH ANESTHESIA (N/A)  Patient location during evaluation: PACU Anesthesia Type: General Level of consciousness: awake Pain management: pain level controlled Vital Signs Assessment: post-procedure vital signs reviewed and stable Respiratory status: spontaneous breathing Cardiovascular status: stable Postop Assessment: no signs of nausea or vomiting Anesthetic complications: no    Last Vitals:  Vitals:   05/25/16 0945 05/25/16 0959  BP:  117/74  Pulse: 81 79  Resp: 12 (!) 9  Temp:  36.6 C    Last Pain:  Vitals:   05/25/16 0940  PainSc: 0-No pain                 Cindel Daugherty

## 2016-05-29 MED FILL — Midazolam HCl Inj 2 MG/2ML (Base Equivalent): INTRAMUSCULAR | Qty: 2 | Status: AC

## 2016-05-29 MED FILL — Lidocaine HCl Local Soln Prefilled Syringe 100 MG/5ML (2%): INTRAMUSCULAR | Qty: 5 | Status: AC

## 2016-05-29 MED FILL — Propofol IV Emul 200 MG/20ML (10 MG/ML): INTRAVENOUS | Qty: 20 | Status: AC

## 2016-05-29 MED FILL — Phenylephrine-NaCl Pref Syr 0.4 MG/10ML-0.9% (40 MCG/ML): INTRAVENOUS | Qty: 10 | Status: AC

## 2016-05-29 MED FILL — Phenylephrine HCl Inj 10 MG/ML: INTRAMUSCULAR | Qty: 1 | Status: AC

## 2016-05-29 MED FILL — Lactated Ringer's Solution: INTRAVENOUS | Qty: 1000 | Status: AC

## 2016-05-29 MED FILL — Succinylcholine Chloride Inj 20 MG/ML: INTRAMUSCULAR | Qty: 4 | Status: AC

## 2016-05-29 MED FILL — Fentanyl Citrate Preservative Free (PF) Inj 100 MCG/2ML: INTRAMUSCULAR | Qty: 2 | Status: AC

## 2016-05-29 MED FILL — Ondansetron HCl Inj 4 MG/2ML (2 MG/ML): INTRAMUSCULAR | Qty: 2 | Status: AC

## 2016-05-31 ENCOUNTER — Encounter: Payer: Self-pay | Admitting: Cardiology

## 2016-05-31 ENCOUNTER — Ambulatory Visit (INDEPENDENT_AMBULATORY_CARE_PROVIDER_SITE_OTHER): Payer: BLUE CROSS/BLUE SHIELD | Admitting: Cardiology

## 2016-05-31 VITALS — BP 132/72 | HR 90 | Ht 62.0 in | Wt 267.1 lb

## 2016-05-31 DIAGNOSIS — I2510 Atherosclerotic heart disease of native coronary artery without angina pectoris: Secondary | ICD-10-CM | POA: Diagnosis not present

## 2016-05-31 DIAGNOSIS — E785 Hyperlipidemia, unspecified: Secondary | ICD-10-CM

## 2016-05-31 DIAGNOSIS — I10 Essential (primary) hypertension: Secondary | ICD-10-CM | POA: Diagnosis not present

## 2016-05-31 NOTE — Patient Instructions (Signed)
Your physician recommends that you continue on your current medications as directed. Please refer to the Current Medication list given to you today.   Your physician recommends that you return for lab work in:  1 WEEK  LIPID LIVER  FASTING Your physician wants you to follow-up in:  6 MONTHS WITH  DR Mayford Knife  You will receive a reminder letter in the mail two months in advance. If you don't receive a letter, please call our office to schedule the follow-up appointment.

## 2016-05-31 NOTE — Progress Notes (Signed)
Cardiology Office Note    Date:  05/31/2016   ID:  [name], DOB [birth year], MRN [id number]  PCP:  Cheral Bay, MD  Cardiologist:  Armanda Magic, MD   Chief Complaint  Patient presents with  . Coronary Artery Disease  . Hypertension  . Hyperlipidemia    History of Present Illness:  [name] is a 55 y.o. female who presents for followup of CAD.  She has a history of ASCAD with normal LM, 30% LAD, 85% mid RCA and 95% distal RCA s/p PCI of the mid to distal RCA and now on DAPT with ASA and Ticagrelor.  She also has HTN, hyperlipidemia and DM.  She is doing well.  She denies any chest pain, dizziness, palpitations, PND, orthopnea or syncope. She has chronic SOB with her asthma which is stable.  She has been having some problems with chronic cellulitis in her LE over the summer requiring several hospitalizations but currently appears stable.  Past Medical History:  Diagnosis Date  . Asthma    QVAR daily and Albuterol as needed  . C. difficile diarrhea    taking Keflex and Bactrim daily  . CHF (congestive heart failure) (HCC) 09/2015   cath with normal LM, 30% LAD, 85% mid RCA and 95% distal RCA s/p PCI of the mid to distal RCA and now on DAPT with ASA and Ticagrelor.    . Chronic pain syndrome    Fentanyl Patch  . Coronary artery disease   . Diabetes mellitus    takes Metformin and Hum R daily  . Eczema   . Excessive daytime sleepiness 11/26/2015  . GERD (gastroesophageal reflux disease)   . Hyperlipidemia   . Hypertension    takes Metoprolol and Enalapril daily  . Hypothyroidism    takes Synthroid daily  . IBS (irritable bowel syndrome)   . Mixed connective tissue disease [facility])     Past Surgical History:  Procedure Laterality Date  . ABDOMINAL HYSTERECTOMY    . APPENDECTOMY    . BREAST SURGERY    . CARDIAC CATHETERIZATION    . cardiac stents N/A 05/23/2016   pt notified department upon arrival for exam .....Marland Kitchen2 stents  . CHOLECYSTECTOMY    . CORONARY  ANGIOPLASTY     normal LM, 30% LAD, 85% mid RCA and 95% distal RCA s/p PCI of the mid to distal RCA and now on DAPT with ASA and Ticagrelor.    . LEG SURGERY Left    muscle biospy  . RADIOLOGY WITH ANESTHESIA N/A 05/25/2016   Procedure: RADIOLOGY WITH ANESTHESIA;  Surgeon: Medication Radiologist, MD;  Location: [facility] OR;  Service: Radiology;  Laterality: N/A;  . WRIST SURGERY Left     Current Medications: Outpatient Medications Prior to Visit  Medication Sig Dispense Refill  . albuterol (PROVENTIL HFA;VENTOLIN HFA) 108 (90 BASE) MCG/ACT inhaler Inhale 2 puffs into the lungs every 4 (four) hours as needed for wheezing or shortness of breath. 1 Inhaler 1  . albuterol (PROVENTIL) (2.5 MG/3ML) 0.083% nebulizer solution Inhale 2.5 mLs into the lungs every 4 (four) hours as needed for wheezing or shortness of breath.   0  . Alpha Lipoic Acid 200 MG CAPS Take 600 mg by mouth 3 (three) times daily.    Marland Kitchen aspirin EC 81 MG tablet Take 81 mg by mouth daily.    Marland Kitchen b complex vitamins tablet Take 1 tablet by mouth daily.    . beclomethasone (QVAR) 40 MCG/ACT inhaler ONE PUFF TWICE A DAY TO PREVENT COUGH  OR WHEEZE. USE SPACER. 1 Inhaler 5  . betamethasone dipropionate (DIPROLENE) 0.05 % cream Apply 1 application topically daily as needed (eczema).    . BRILINTA 90 MG TABS tablet Take 90 mg by mouth 2 (two) times daily.  2  . cetirizine (ZYRTEC) 10 MG tablet Take 10 mg by mouth daily as needed for allergies.    . clotrimazole-betamethasone (LOTRISONE) cream Apply 1 application topically daily as needed (eczema).    . diphenhydrAMINE (BENADRYL) 25 MG tablet Take 1 tablet (25 mg total) by mouth every 6 (six) hours as needed for itching or allergies. 10 tablet 0  . enalapril (VASOTEC) 10 MG tablet Take 10 mg by mouth 2 (two) times daily.    Marland Kitchen EPINEPHrine (EPIPEN 2-PAK) 0.3 mg/0.3 mL IJ SOAJ injection USE AS DIRECTED FOR SEVERE ALLERGIC REACTION. 2 Device 2  . Estrogens, Conjugated (PREMARIN VA) Place 1 application  vaginally 3 (three) times a week. On Saturdays, Mondays, and Wednesday (cream)    . fentaNYL (DURAGESIC - DOSED MCG/HR) 75 MCG/HR Place 75 mcg onto the skin every other day.     . fluticasone (FLONASE) 50 MCG/ACT nasal spray Place 2 sprays into both nostrils daily as needed for allergies or rhinitis. 16 g 5  . furosemide (LASIX) 40 MG tablet Take 40 mg by mouth daily as needed for fluid.     Marland Kitchen gabapentin (NEURONTIN) 100 MG capsule Take 400-700 mg by mouth See admin instructions. Pt takes 400mg  in the morning, 400mg  at lunchtime and 700mg  in the evening    . GLUCOMANNAN PO Take 700 mg by mouth 2 (two) times daily.    . Guaifenesin (MUCINEX MAXIMUM STRENGTH) 1200 MG TB12 Take 1,200 mg by mouth 2 (two) times daily.    . hydroxychloroquine (PLAQUENIL) 200 MG tablet Take 200 mg by mouth 2 (two) times daily.    . insulin regular human CONCENTRATED (HUMULIN R) 500 UNIT/ML SOLN injection Inject 0-100 Units into the skin 4 (four) times daily -  before meals and at bedtime. TID w/ meals Cbgs <125 = no insulin          125-150 = 10 units using insulin syringe ( actual dose is 50 units)          151 - 200 = 15 units using insulin syringe ( actual dose is 75 units)          > 201 = 20 units using insulin syringe ( actual dose is 100 units)  QHS Cbgs <150 = no insulin          151-200= 5 units using insulin syringe ( actual dose is 25 units)          201 - 300 = 10 units using insulin syringe ( actual dose is 50 units)          > 300 = 15 units using insulin syringe ( actual dose is 75 units)    . Insulin Syringe-Needle U-100 (EASY TOUCH INSULIN SYRINGE) 30G X 5/16" 0.5 ML MISC     . levalbuterol (XOPENEX) 1.25 MG/0.5ML nebulizer solution Take 1 ampule by nebulization 4 (four) times daily as needed for wheezing or shortness of breath.    . levothyroxine (SYNTHROID, LEVOTHROID) 200 MCG tablet Take 200 mcg by mouth daily before breakfast.     . magnesium oxide (MAG-OX) 400 MG tablet Take 400 mg by mouth every  evening.     . metFORMIN (GLUCOPHAGE-XR) 500 MG 24 hr tablet Take 1,000 mg by mouth 2 (two) times  daily.  0  . metoprolol tartrate (LOPRESSOR) 25 MG tablet Take 1 tablet (25 mg total) by mouth 2 (two) times daily. 180 tablet 1  . milk thistle 175 MG tablet Take 175 mg by mouth 2 (two) times daily.     . ondansetron (ZOFRAN) 4 MG tablet Take 1 tablet (4 mg total) by mouth every 8 (eight) hours as needed for nausea or vomiting. 20 tablet 0  . Oxycodone HCl 10 MG TABS Take 10 mg by mouth every 4 (four) hours as needed (for pain).    Marland Kitchen oxymetazoline (AFRIN) 0.05 % nasal spray Place 1 spray into both nostrils 2 (two) times daily as needed for congestion.    Bertram Gala Glycol-Propyl Glycol (SYSTANE ULTRA OP) Apply 1 drop to eye daily as needed (dry eyes).    . promethazine (PHENERGAN) 25 MG tablet Take 25 mg by mouth every 6 (six) hours as needed for nausea or vomiting.    . saccharomyces boulardii (FLORASTOR) 250 MG capsule Take 1 capsule (250 mg total) by mouth 2 (two) times daily. 30 capsule 0  . vitamin A 37628 UNIT capsule Take 10,000 Units by mouth daily.    . vitamin B-12 (CYANOCOBALAMIN) 1000 MCG tablet Take 1,000 mcg by mouth daily.      . Vitamin D, Ergocalciferol, (DRISDOL) 50000 units CAPS capsule Take 1 capsule by mouth once a week. Monday.  1  . Vitamins-Lipotropics (LIPO-FLAVONOID PLUS PO) Take 1 tablet by mouth 3 (three) times daily.    . cephALEXin (KEFLEX) 500 MG capsule Take 1 capsule (500 mg total) by mouth 4 (four) times daily. (Patient not taking: Reported on 05/31/2016) 40 capsule 0  . levothyroxine (SYNTHROID, LEVOTHROID) 25 MCG tablet Take 25 mcg by mouth See admin instructions. Take 225 mcg once daily on Monday-Friday. Take 200 mcg once daily on Saturday and Sunday.  0  . metroNIDAZOLE (FLAGYL) 500 MG tablet Take 1 tablet (500 mg total) by mouth every 8 (eight) hours. (Patient not taking: Reported on 05/31/2016) 60 tablet 0  . Multiple Vitamin (MULTIVITAMIN) tablet Take 1 tablet  by mouth daily.    Marland Kitchen sulfamethoxazole-trimethoprim (BACTRIM DS,SEPTRA DS) 800-160 MG tablet Take 2 tablets by mouth 2 (two) times daily. (Patient not taking: Reported on 05/31/2016) 40 tablet 0   No facility-administered medications prior to visit.      Allergies:   Atrovent; Fish allergy; Fish oil; Fish oil [omega-3 fatty acids]; Ipratropium; Crestor [rosuvastatin]; Gabapentin; Lovastatin; Monascus purpureus went yeast; Red yeast rice [cholestin]; Shellfish allergy; Penicillins; Suprep [na sulfate-k sulfate-mg sulf]; and Tape   Social History   Social History  . Marital status: Married    Spouse name: N/A  . Number of children: N/A  . Years of education: N/A   Social History Main Topics  . Smoking status: Former Smoker    Packs/day: 1.00    Years: 20.00    Types: Cigarettes    Quit date: 02/11/2004  . Smokeless tobacco: Never Used  . Alcohol use Yes     Comment: once a month  . Drug use: No  . Sexual activity: Yes    Birth control/ protection: Surgical   Other Topics Concern  . None   Social History Narrative  . None     Family History:  The patient's family history includes Allergic rhinitis in her father; Asthma in her father; Breast cancer in her paternal aunt; CAD in her father and mother; Heart attack in her father and mother; Hypertension in her brother, brother, and  mother; Pancreatic cancer in her paternal aunt.   ROS:   Please see the history of present illness.    ROS All other systems reviewed and are negative.  No flowsheet data found.     PHYSICAL EXAM:   VS:  BP 132/72   Pulse 90   Ht 5\' 2"  (1.575 m)   Wt 267 lb 1.9 oz (121.2 kg)   SpO2 96%   BMI 48.86 kg/m    GEN: Well nourished, well developed, in no acute distress  HEENT: normal  Neck: no JVD, carotid bruits, or masses Cardiac: RRR; no murmurs, rubs, or gallops.  Trace edema.  Intact distal pulses bilaterally.  Respiratory:  clear to auscultation bilaterally, normal work of breathing GI:  soft, nontender, nondistended, + BS MS: no deformity or atrophy  Skin: warm and dry, no rash Neuro:  Alert and Oriented x 3, Strength and sensation are intact Psych: euthymic mood, full affect  Wt Readings from Last 3 Encounters:  05/31/16 267 lb 1.9 oz (121.2 kg)  05/25/16 265 lb (120.2 kg)  05/16/16 265 lb 8 oz (120.4 kg)      Studies/Labs Reviewed:   EKG:  EKG is ordered today.  The ekg ordered today demonstrates   Recent Labs: 03/09/2016: TSH 0.028 04/30/2016: ALT 26 05/16/2016: BUN 14; Creatinine, Ser 0.92; Hemoglobin 13.3; Platelets 270; Potassium 4.6; Sodium 136   Lipid Panel    Component Value Date/Time   CHOL 191 02/11/2015 0310   TRIG 140 02/11/2015 0310   HDL 37 (L) 02/11/2015 0310   CHOLHDL 5.2 02/11/2015 0310   VLDL 28 02/11/2015 0310   LDLCALC 126 (H) 02/11/2015 0310    Additional studies/ records that were reviewed today include:  none    ASSESSMENT:    1. Coronary artery disease involving native coronary artery of native heart without angina pectoris   2. Benign essential HTN   3. HLD (hyperlipidemia)      PLAN:  In order of problems listed above:  1. ASCAD with cath showing normal LM, 30% LAD, 85% mid RCA and 95% distal RCA s/p PCI of the mid to distal RCA and now on DAPT with ASA and Ticagrelor.  She has not had any further CP.  She will continue on DAPT for at least a year.  Continue statin and BB.   2. HTN - BP controlled on current meds.  Continue BB. 3. Hyperlipidemia - LDL goal < 70.  Check FLP and ALT.  Continue statin.     Medication Adjustments/Labs and Tests Ordered: Current medicines are reviewed at length with the patient today.  Concerns regarding medicines are outlined above.  Medication changes, Labs and Tests ordered today are listed in the Patient Instructions below.  There are no Patient Instructions on file for this visit.   Signed, 04/13/2015, MD  05/31/2016 3:33 PM    [facility] [address]  [place], [place], [place]  [place] Phone: [phone]; Fax: [phone]

## 2016-06-30 ENCOUNTER — Telehealth: Payer: Self-pay | Admitting: *Deleted

## 2016-06-30 NOTE — Telephone Encounter (Signed)
Unable to reach patient at time of Pre-Visit Call.  Left message for patient to return call when available.    

## 2016-07-03 ENCOUNTER — Encounter: Payer: Self-pay | Admitting: Family Medicine

## 2016-07-03 ENCOUNTER — Ambulatory Visit (INDEPENDENT_AMBULATORY_CARE_PROVIDER_SITE_OTHER): Payer: BLUE CROSS/BLUE SHIELD | Admitting: Family Medicine

## 2016-07-03 VITALS — BP 138/62 | HR 82 | Temp 98.0°F | Ht 62.0 in | Wt 267.8 lb

## 2016-07-03 DIAGNOSIS — I10 Essential (primary) hypertension: Secondary | ICD-10-CM | POA: Diagnosis not present

## 2016-07-03 DIAGNOSIS — J45909 Unspecified asthma, uncomplicated: Secondary | ICD-10-CM | POA: Diagnosis not present

## 2016-07-03 DIAGNOSIS — E559 Vitamin D deficiency, unspecified: Secondary | ICD-10-CM

## 2016-07-03 DIAGNOSIS — S91302A Unspecified open wound, left foot, initial encounter: Secondary | ICD-10-CM | POA: Diagnosis not present

## 2016-07-03 MED ORDER — VITAMIN D (ERGOCALCIFEROL) 1.25 MG (50000 UNIT) PO CAPS: 50000.0000 [IU] | ORAL_CAPSULE | ORAL | 3 refills | Status: DC

## 2016-07-03 MED ORDER — ENALAPRIL MALEATE 10 MG PO TABS: 10.0000 mg | ORAL_TABLET | Freq: Two times a day (BID) | ORAL | 3 refills | Status: DC

## 2016-07-03 NOTE — Progress Notes (Signed)
[facility] [address], [address] [place], [place] [address] [phone]  Date:  07/03/2016   Name:  [name]   DOB:  Feb 04, 1961   MRN:  [id number]  PCP:  Cheral Bay, MD    Chief Complaint: Establish Care (Pt here to est care. Will need refills on Enalapril and Vitamin D today.)   History of Present Illness:  [name] is a 55 y.o. very pleasant female patient who presents with the following:  Here today as a new patient.  She bring with her a 3 page typed document detailing her medical history and dx including asthma, hypothyroidism. GERD, IBS, DM, HTN, high cholesterol, FBM, RA, "heart blockage" and aortic stenosis She had PCI 10/09/2015.  Her cardiologist is D. Turner at [facility]; she had a PCA to the right RCA  She was in the hospital a few times over the summer with cellulitis of her legs - she was last hospitalized in August with this.  She developed a blister across the dorsum of the left foot last month.  She is using a "wound honey" and duoderm dressings   She does have endocrinology who manages her DM  She has not seen OBG in a while- last mammo about 2 years ago.  She has had several bx in the past but all were negative.  She needs a new place to get her mammograms   Her colonoscopy is UTD.    She has tried to stop her rx vitamin D but her level will drop if she goes off the rx and uses OTC.  She would like to just continue using this which I think is fine  They got a puppy recently and they are getting more exercise, spending time outdoors and overall her outlook on life is better since she got the dog  She had a hysterectomy- done due to fibroids.  She is a FORMER smoker- she quit smoking over 10 years ago.    Her BP and O2 sat Patient Active Problem List   Diagnosis Date Noted  . Edema   . Hyperbilirubinemia 04/29/2016  . Hyponatremia 04/29/2016  . Sepsis (HCC) 04/28/2016  . Cellulitis of right leg  03/09/2016  . Cellulitis of leg, right 03/09/2016  . Mild persistent asthma 02/15/2016  . Allergic rhinitis due to pollen 02/15/2016  . Allergy with anaphylaxis due to food 02/15/2016  . Multiple drug allergies 02/15/2016  . Anaphylactic reaction due to food 02/10/2016  . Severe persistent asthma 02/10/2016  . H/O mixed connective tissue disease 02/10/2016  . Type 2 diabetes mellitus with complication (HCC) 02/10/2016  . Current use of beta blocker 02/10/2016  . Coronary artery disease involving native coronary artery of native heart without angina pectoris 02/10/2016  . Gastroesophageal reflux disease without esophagitis 02/10/2016  . Immunodeficiency (HCC) 02/10/2016  . Atopic eczema 02/10/2016  . Cervical nerve root disorder 12/15/2015  . Lumbar radiculopathy 12/15/2015  . Excessive daytime sleepiness 11/26/2015  . Daytime somnolence 11/26/2015  . Hypomagnesemia 11/24/2015  . Breath shortness 11/24/2015  . Acquired hypothyroidism 10/01/2015  . Abnormal cardiovascular stress test 09/15/2015  . Awareness of heartbeats 08/23/2015  . C. difficile colitis 03/02/2015  . C. difficile diarrhea 02/12/2015  . Diabetes mellitus without complication (HCC) 02/11/2015  . Leg edema 02/11/2015  . Nausea vomiting and diarrhea 02/10/2015  . Adiposity 06/19/2014  . Abnormal LFTs 03/26/2014  . B-complex deficiency 03/26/2014  . Benign essential HTN 03/26/2014  .  Chronic pain associated with significant psychosocial dysfunction 03/26/2014  . Diaphragmatic hernia 03/26/2014  . Acid reflux 03/26/2014  . Hammer toe 03/26/2014  . H/O neoplasm 03/26/2014  . 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 Diabetic polyneuropathy (HCC) 03/26/2014  . Deafness, sensorineural 03/26/2014  . Type 2 diabetes mellitus (HCC) 03/26/2014  . Allergy to environmental factors 01/20/2014  . Fibromyalgia 01/20/2014  . HLD (hyperlipidemia) 01/20/2014  . Degeneration of intervertebral disc of lumbar region 01/20/2014  .  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 Avitaminosis D 01/20/2014  . Type II or unspecified type diabetes mellitus without mention of complication, not stated as uncontrolled 11/11/2012  . Other and unspecified hyperlipidemia 11/11/2012  . Mixed connective tissue disease (HCC) 11/11/2012  . Hypothyroidism 11/11/2012  . Asthma 11/11/2012  . 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HCC) 02/20/2012  . 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 ANA positive 07/17/2011    Past Medical History:  Diagnosis Date  . Asthma    QVAR daily and Albuterol as needed  . C. difficile diarrhea    taking Keflex and Bactrim daily  . CHF (congestive heart failure) (HCC) 09/2015   cath with normal LM, 30% LAD, 85% mid RCA and 95% distal RCA s/p PCI of the mid to distal RCA and now on DAPT with ASA and Ticagrelor.    . Chronic pain syndrome    Fentanyl Patch  . Coronary artery disease   . Diabetes mellitus    takes Metformin and Hum R daily  . Eczema   . Excessive daytime sleepiness 11/26/2015  . GERD (gastroesophageal reflux disease)   . Hyperlipidemia   . Hypertension    takes Metoprolol and Enalapril daily  . Hypothyroidism    takes Synthroid daily  . IBS (irritable bowel syndrome)   . Mixed connective tissue disease [facility])     Past Surgical History:  Procedure Laterality Date  . ABDOMINAL HYSTERECTOMY    . APPENDECTOMY    . BREAST SURGERY    . CARDIAC CATHETERIZATION    . cardiac stents N/A 05/23/2016   pt notified department upon arrival for exam .....Marland Kitchen2 stents  . CHOLECYSTECTOMY    . CORONARY ANGIOPLASTY     normal LM, 30% LAD, 85% mid RCA and 95% distal RCA s/p PCI of the mid to distal RCA and now on DAPT with ASA and Ticagrelor.    . LEG SURGERY Left    muscle biospy  . RADIOLOGY WITH ANESTHESIA N/A 05/25/2016   Procedure: RADIOLOGY WITH ANESTHESIA;  Surgeon: Medication Radiologist, MD;  Location: [facility] OR;  Service:  Radiology;  Laterality: N/A;  . WRIST SURGERY Left     Social History  Substance Use Topics  . Smoking status: Former Smoker    Packs/day: 1.00    Years: 20.00    Types: Cigarettes    Quit date: 02/11/2004  . Smokeless tobacco: Never Used  . Alcohol use Yes     Comment: once a month    Family History  Problem Relation Age of Onset  . CAD Mother   . Hypertension Mother   . Heart attack Mother   . CAD Father   . Heart attack Father   . Allergic rhinitis Father   . Asthma Father   . Hypertension Brother   . Hypertension Brother   . Pancreatic cancer Paternal Aunt   . Breast cancer Paternal Aunt     Allergies  Allergen Reactions  . Atrovent Shortness Of Breath  Wheezing becomes worse  . Fish Allergy Anaphylaxis    INCLUDES OMEGA 3 OILS  . Fish Oil Anaphylaxis and Swelling    THROAT SWELLS INCLUDES FISH AS A CLASS  . Fish Oil [Omega-3 Fatty Acids] Swelling    Throat swelling   . Ipratropium Shortness Of Breath  . Crestor [Rosuvastatin] Other (See Comments)    Extreme joint pain, to this & other statins  . Gabapentin Anxiety    Anxiety on high doses  . Lovastatin Other (See Comments)    EXTREME JOINT PAIN  . Monascus Purpureus Went Yeast Other (See Comments)    Muscle pain and severe joint pain.   . Red Yeast Rice [Cholestin] Other (See Comments)    Muscle pain and severe joint pain.   . Shellfish Allergy Other (See Comments)    UNSPECIFIED REACTION   . Penicillins Rash and Other (See Comments)    UNSPECIFIED, TOLERATES KEFLEX  CHILDHOOD RX PCN CHALLENGE PER ALLERGIST, 02/29/16.  RESULTS NOT UPDATED.    Rolene Arbour [Na Sulfate-K Sulfate-Mg Sulf] Nausea And Vomiting  . Tape Other (See Comments)    Electrodes causes skin breakdown    Medication list has been reviewed and updated.  Current Outpatient Prescriptions on File Prior to Visit  Medication Sig Dispense Refill  . albuterol (PROVENTIL HFA;VENTOLIN HFA) 108 (90 BASE) MCG/ACT inhaler Inhale 2 puffs into the  lungs every 4 (four) hours as needed for wheezing or shortness of breath. 1 Inhaler 1  . albuterol (PROVENTIL) (2.5 MG/3ML) 0.083% nebulizer solution Inhale 2.5 mLs into the lungs every 4 (four) hours as needed for wheezing or shortness of breath.   0  . Alpha Lipoic Acid 200 MG CAPS Take 600 mg by mouth 3 (three) times daily.    Marland Kitchen aspirin EC 81 MG tablet Take 81 mg by mouth daily.    Marland Kitchen b complex vitamins tablet Take 1 tablet by mouth daily.    . beclomethasone (QVAR) 40 MCG/ACT inhaler ONE PUFF TWICE A DAY TO PREVENT COUGH OR WHEEZE. USE SPACER. 1 Inhaler 5  . betamethasone dipropionate (DIPROLENE) 0.05 % cream Apply 1 application topically daily as needed (eczema).    . BRILINTA 90 MG TABS tablet Take 90 mg by mouth 2 (two) times daily.  2  . cetirizine (ZYRTEC) 10 MG tablet Take 10 mg by mouth daily as needed for allergies.    . clotrimazole-betamethasone (LOTRISONE) cream Apply 1 application topically daily as needed (eczema).    . diphenhydrAMINE (BENADRYL) 25 MG tablet Take 1 tablet (25 mg total) by mouth every 6 (six) hours as needed for itching or allergies. 10 tablet 0  . enalapril (VASOTEC) 10 MG tablet Take 10 mg by mouth 2 (two) times daily.    Marland Kitchen EPINEPHrine (EPIPEN 2-PAK) 0.3 mg/0.3 mL IJ SOAJ injection USE AS DIRECTED FOR SEVERE ALLERGIC REACTION. 2 Device 2  . Estrogens, Conjugated (PREMARIN VA) Place 1 application vaginally 3 (three) times a week. On Saturdays, Mondays, and Wednesday (cream)    . fentaNYL (DURAGESIC - DOSED MCG/HR) 75 MCG/HR Place 75 mcg onto the skin every other day.     . fluticasone (FLONASE) 50 MCG/ACT nasal spray Place 2 sprays into both nostrils daily as needed for allergies or rhinitis. 16 g 5  . furosemide (LASIX) 40 MG tablet Take 40 mg by mouth daily as needed for fluid.     Marland Kitchen gabapentin (NEURONTIN) 100 MG capsule Take 400-700 mg by mouth See admin instructions. Pt takes 400mg  in the morning, 400mg  at  lunchtime and 700mg  in the evening    . GLUCOMANNAN PO  Take 700 mg by mouth 2 (two) times daily.    . Guaifenesin (MUCINEX MAXIMUM STRENGTH) 1200 MG TB12 Take 1,200 mg by mouth 2 (two) times daily.    . hydroxychloroquine (PLAQUENIL) 200 MG tablet Take 200 mg by mouth 2 (two) times daily.    . insulin regular human CONCENTRATED (HUMULIN R) 500 UNIT/ML SOLN injection Inject 0-100 Units into the skin 4 (four) times daily -  before meals and at bedtime. TID w/ meals Cbgs <125 = no insulin          125-150 = 10 units using insulin syringe ( actual dose is 50 units)          151 - 200 = 15 units using insulin syringe ( actual dose is 75 units)          > 201 = 20 units using insulin syringe ( actual dose is 100 units)  QHS Cbgs <150 = no insulin          151-200= 5 units using insulin syringe ( actual dose is 25 units)          201 - 300 = 10 units using insulin syringe ( actual dose is 50 units)          > 300 = 15 units using insulin syringe ( actual dose is 75 units)    . Insulin Syringe-Needle U-100 (EASY TOUCH INSULIN SYRINGE) 30G X 5/16" 0.5 ML MISC     . levalbuterol (XOPENEX) 1.25 MG/0.5ML nebulizer solution Take 1 ampule by nebulization 4 (four) times daily as needed for wheezing or shortness of breath.    . levothyroxine (SYNTHROID, LEVOTHROID) 200 MCG tablet Take 200 mcg by mouth daily before breakfast.     . magnesium oxide (MAG-OX) 400 MG tablet Take 400 mg by mouth every evening.     . metFORMIN (GLUCOPHAGE-XR) 500 MG 24 hr tablet Take 1,000 mg by mouth 2 (two) times daily.  0  . metoprolol tartrate (LOPRESSOR) 25 MG tablet Take 1 tablet (25 mg total) by mouth 2 (two) times daily. 180 tablet 1  . milk thistle 175 MG tablet Take 175 mg by mouth 2 (two) times daily.     . ondansetron (ZOFRAN) 4 MG tablet Take 1 tablet (4 mg total) by mouth every 8 (eight) hours as needed for nausea or vomiting. 20 tablet 0  . Oxycodone HCl 10 MG TABS Take 10 mg by mouth every 4 (four) hours as needed (for pain).    Marland Kitchen. oxymetazoline (AFRIN) 0.05 % nasal spray  Place 1 spray into both nostrils 2 (two) times daily as needed for congestion.    Bertram Gala. Polyethyl Glycol-Propyl Glycol (SYSTANE ULTRA OP) Apply 1 drop to eye daily as needed (dry eyes).    . promethazine (PHENERGAN) 25 MG tablet Take 25 mg by mouth every 6 (six) hours as needed for nausea or vomiting.    . saccharomyces boulardii (FLORASTOR) 250 MG capsule Take 1 capsule (250 mg total) by mouth 2 (two) times daily. 30 capsule 0  . vitamin A [id number] UNIT capsule Take 10,000 Units by mouth daily.    . vitamin B-12 (CYANOCOBALAMIN) 1000 MCG tablet Take 1,000 mcg by mouth daily.      . Vitamin D, Ergocalciferol, (DRISDOL) 50000 units CAPS capsule Take 1 capsule by mouth once a week. Monday.  1  . Vitamins-Lipotropics (LIPO-FLAVONOID PLUS PO) Take 1 tablet by mouth 3 (three) times daily.  No current facility-administered medications on file prior to visit.     Review of Systems:  As per HPI- otherwise negative.   Physical Examination: Vitals:   07/03/16 1033  BP: 138/62  Pulse: 82  Temp: 98 F (36.7 C)   Vitals:   07/03/16 1033  Weight: 267 lb 12.8 oz (121.5 kg)  Height: 5\' 2"  (1.575 m)   Body mass index is 48.98 kg/m. Ideal Body Weight: Weight in (lb) to have BMI = 25: 136.4  GEN: WDWN, NAD, Non-toxic, A & O x 3, central obesity, otherwise looks well HEENT: Atraumatic, Normocephalic. Neck supple. No masses, No LAD. Ears and Nose: No external deformity. CV: RRR, No M/G/R. No JVD. No thrill. No extra heart sounds. PULM: CTA B, no wheezes, crackles, rhonchi. No retractions. No resp. distress. No accessory muscle use. ABD: S, NT, ND EXTR: No c/c/e NEURO Normal gait.  PSYCH: Normally interactive. Conversant. Not depressed or anxious appearing.  Calm demeanor.  The dorsum of the left foot at the ankle flexture displays a healing wound- it does not appear infected and per pt and her husband is significantly smaller than it has been   Assessment and Plan: Essential hypertension,  benign - Plan: enalapril (VASOTEC) 10 MG tablet  Vitamin D deficiency - Plan: Vitamin D, Ergocalciferol, (DRISDOL) 50000 units CAPS capsule  Uncomplicated asthma, unspecified asthma severity, unspecified whether persistent  Unspecified open wound, left foot, initial encounter  Refilled enalapril, and vitamin D BP is well controlled Her asthma is under pretty good control, but she admits she has not been using her qvar and her wheezing has been more frequent.  Urged her to use the qvar regularly and she will try   Plan to recheck in 4 months   Signed , MD

## 2016-07-03 NOTE — Progress Notes (Signed)
Pre visit review using our clinic review tool, if applicable. No additional management support is needed unless otherwise documented below in the visit note. 

## 2016-07-03 NOTE — Patient Instructions (Addendum)
Please contact the [facility] - see info below- and schedule a screening mammogram at your convenience.    I think your idea of using the duoderm on your ankle wound is a good one- go ahead and try this If you seem to be getting any cellulitis let me know  Address: [address], [place], [place] [address]  Phone: [phone]  Please see me in about 4 months to check on how you are doing

## 2016-07-26 ENCOUNTER — Telehealth: Payer: Self-pay | Admitting: Family Medicine

## 2016-07-26 ENCOUNTER — Other Ambulatory Visit: Payer: Self-pay

## 2016-07-26 ENCOUNTER — Other Ambulatory Visit: Payer: Self-pay | Admitting: Cardiology

## 2016-07-26 DIAGNOSIS — N6320 Unspecified lump in the left breast, unspecified quadrant: Secondary | ICD-10-CM

## 2016-07-26 DIAGNOSIS — Z1231 Encounter for screening mammogram for malignant neoplasm of breast: Secondary | ICD-10-CM

## 2016-07-26 NOTE — Telephone Encounter (Signed)
Pt says that she found a lump in her breast and called the breast center. She says that they advised her to call her PCP and inform her to place a referral, on referral state that pt needs a Diagnostic on both breast and ultra sound of left breast.      CB: [phone]

## 2016-07-27 NOTE — Telephone Encounter (Signed)
Spoke to pt. Lump is located on left breast at the top of the breast. Pt states that she noticed a huge bruise and lump on left  Breast. Area is a little sore. [facility].

## 2016-10-02 ENCOUNTER — Ambulatory Visit: Payer: BLUE CROSS/BLUE SHIELD | Admitting: Pediatrics

## 2016-10-06 ENCOUNTER — Other Ambulatory Visit: Payer: Self-pay | Admitting: *Deleted

## 2016-10-06 MED ORDER — BRILINTA 90 MG PO TABS: 90.0000 mg | ORAL_TABLET | Freq: Two times a day (BID) | ORAL | 1 refills | Status: DC

## 2016-10-21 ENCOUNTER — Telehealth: Payer: Self-pay | Admitting: Family Medicine

## 2016-10-21 NOTE — Telephone Encounter (Signed)
Received a large packet of records from [place]- however this information is already in Care everywhere so it does not need to be abstracted

## 2016-10-30 ENCOUNTER — Encounter: Payer: Self-pay | Admitting: Pediatrics

## 2016-10-30 ENCOUNTER — Other Ambulatory Visit: Payer: Self-pay

## 2016-10-30 ENCOUNTER — Ambulatory Visit (INDEPENDENT_AMBULATORY_CARE_PROVIDER_SITE_OTHER): Payer: BLUE CROSS/BLUE SHIELD | Admitting: Pediatrics

## 2016-10-30 VITALS — BP 122/74 | HR 70 | Temp 97.7°F | Resp 18

## 2016-10-30 DIAGNOSIS — E119 Type 2 diabetes mellitus without complications: Secondary | ICD-10-CM | POA: Diagnosis not present

## 2016-10-30 DIAGNOSIS — M351 Other overlap syndromes: Secondary | ICD-10-CM

## 2016-10-30 DIAGNOSIS — Z79899 Other long term (current) drug therapy: Secondary | ICD-10-CM | POA: Diagnosis not present

## 2016-10-30 DIAGNOSIS — J4530 Mild persistent asthma, uncomplicated: Secondary | ICD-10-CM

## 2016-10-30 DIAGNOSIS — K219 Gastro-esophageal reflux disease without esophagitis: Secondary | ICD-10-CM | POA: Diagnosis not present

## 2016-10-30 DIAGNOSIS — IMO0001 Reserved for inherently not codable concepts without codable children: Secondary | ICD-10-CM

## 2016-10-30 DIAGNOSIS — Z794 Long term (current) use of insulin: Secondary | ICD-10-CM | POA: Diagnosis not present

## 2016-10-30 DIAGNOSIS — T7800XD Anaphylactic reaction due to unspecified food, subsequent encounter: Secondary | ICD-10-CM | POA: Diagnosis not present

## 2016-10-30 MED ORDER — ALBUTEROL SULFATE HFA 108 (90 BASE) MCG/ACT IN AERS: 2.0000 | INHALATION_SPRAY | RESPIRATORY_TRACT | 1 refills | Status: DC | PRN

## 2016-10-30 MED ORDER — EPINEPHRINE 0.3 MG/0.3ML IJ SOAJ: INTRAMUSCULAR | 3 refills | Status: DC

## 2016-10-30 MED ORDER — ALBUTEROL SULFATE (2.5 MG/3ML) 0.083% IN NEBU: 2.5000 mL | INHALATION_SOLUTION | RESPIRATORY_TRACT | 1 refills | Status: DC | PRN

## 2016-10-30 MED ORDER — BECLOMETHASONE DIPROPIONATE 40 MCG/ACT IN AERS: INHALATION_SPRAY | RESPIRATORY_TRACT | 5 refills | Status: DC

## 2016-10-30 NOTE — Progress Notes (Signed)
[address] [place] [address] Dept: [phone]  FOLLOW UP NOTE  Patient ID: [name], female    DOB: 02-19-1961  Age: 56 y.o. MRN: [id number] Date of Office Visit: 10/30/2016  Assessment  Chief Complaint: Asthma and Other (pt wants to be tested for coconut and more fish)  HPI [name] presents for follow-up of asthma, allergic rhinitis and food allergies. She can eat flounder and cod . She has been drinking large  amounts of coconut milk and occasionally she has some irritation in the back of her throat. Her serum Immunocap IgE to tree nuts was negative. She avoids perch  and other freshwater fish. If she takes Qvar 40 one puff twice a day she has excellent control of her asthma but she sometimes forgets to use Qvar and then  has some coughing spells.  Current medications are Zyrtec 10 mg once a day, Proventil 2 puffs every 4 hours if needed, Qvar 40-1 puff twice a day to prevent coughing or wheezing, fluticasone 2 sprays per nostril once a day. If she has an allergic reaction she takes  Benadryl 4 teaspoonfuls every 4 hours and if she has life-threatening symptoms she, she injects with EpiPen 0.3 mg. Her other medications are outlined in the chart   Drug Allergies:  Allergies  Allergen Reactions  . Atrovent Shortness Of Breath    Wheezing becomes worse  . Fish Allergy Anaphylaxis    INCLUDES OMEGA 3 OILS  . Fish Oil Anaphylaxis and Swelling    THROAT SWELLS INCLUDES FISH AS A CLASS  . Fish Oil [Omega-3 Fatty Acids] Swelling    Throat swelling   . Ipratropium Shortness Of Breath  . Crestor [Rosuvastatin] Other (See Comments)    Extreme joint pain, to this & other statins  . Gabapentin Anxiety    Anxiety on high doses  . Lovastatin Other (See Comments)    EXTREME JOINT PAIN  . Monascus Purpureus Went Yeast Other (See Comments)    Muscle pain and severe joint pain.   . Red Yeast Rice [Cholestin] Other (See Comments)    Muscle pain and severe joint pain.   .  Shellfish Allergy Other (See Comments)    UNSPECIFIED REACTION   . Penicillins Rash and Other (See Comments)    UNSPECIFIED, TOLERATES KEFLEX  CHILDHOOD RX PCN CHALLENGE PER ALLERGIST, 02/29/16.  RESULTS NOT UPDATED.    Rolene Arbour [Na Sulfate-K Sulfate-Mg Sulf] Nausea And Vomiting  . Tape Other (See Comments)    Electrodes causes skin breakdown    Physical Exam: BP 122/74   Pulse 70   Temp 97.7 F (36.5 C) (Oral)   Resp 18   SpO2 96%    Physical Exam  Constitutional: She appears well-developed and well-nourished.  HENT:  Eyes normal. Ears normal. Nose normal. Pharynx normal.  Neck: Neck supple.  Cardiovascular:  S1 and S2 normal no murmurs  Pulmonary/Chest:  Clear to percussion and auscultation  Lymphadenopathy:    She has no cervical adenopathy.  Psychiatric: She has a normal mood and affect. Her behavior is normal. Judgment and thought content normal.    Diagnostics:  FVC 2.43 L FEV1 1.82 L. Predicted FVC 3.17 L predicted FEV1 2.48 L-this shows a mild reduction in the forced vital capacity  Assessment and Plan: 1. Mild persistent asthma without complication   2. Anaphylactic shock due to food, subsequent encounter   3. Mixed connective tissue disease (HCC)   4. Insulin dependent diabetes mellitus (HCC)   5. Gastroesophageal reflux disease  without esophagitis   6. Current use of beta blocker     Meds ordered this encounter  Medications  . EPINEPHrine (EPIPEN 2-PAK) 0.3 mg/0.3 mL IJ SOAJ injection    Sig: USE AS DIRECTED FOR SEVERE ALLERGIC REACTION.    Dispense:  2 Device    Refill:  3    Dispense Mylan generic brand only  . DISCONTD: albuterol (PROVENTIL HFA;VENTOLIN HFA) 108 (90 Base) MCG/ACT inhaler    Sig: Inhale 2 puffs into the lungs every 4 (four) hours as needed for wheezing or shortness of breath.    Dispense:  1 Inhaler    Refill:  1  . albuterol (PROVENTIL) (2.5 MG/3ML) 0.083% nebulizer solution    Sig: Inhale 2.5 mLs into the lungs every 4 (four)  hours as needed for wheezing or shortness of breath.    Dispense:  75 mL    Refill:  1  . beclomethasone (QVAR) 40 MCG/ACT inhaler    Sig: ONE PUFF TWICE A DAY TO PREVENT COUGH OR WHEEZE. USE SPACER. Rinse, gargle and spit out after use    Dispense:  1 Inhaler    Refill:  5    Patient Instructions  Continue on her present treatment plan but use Qvar 40-1 puff twice a day on a daily basis We could do a gradual oral challenge to coconut milk if she would like Continue eating flounder and cod fish She will call me if she's not doing well on this treatment plan   Return in about 6 months (around 04/29/2017).    Thank you for the opportunity to care for this patient.  Please do not hesitate to contact me with questions.  Tonette Bihari, M.D.  [facility] [address] [place], [place] [address] [phone]

## 2016-10-30 NOTE — Patient Instructions (Addendum)
Continue on her present treatment plan but use Qvar 40-1 puff twice a day on a daily basis We could do a gradual oral challenge to coconut milk if she would like Continue eating flounder and cod fish She will call me if she's not doing well on this treatment plan

## 2016-11-06 ENCOUNTER — Ambulatory Visit (INDEPENDENT_AMBULATORY_CARE_PROVIDER_SITE_OTHER): Payer: BLUE CROSS/BLUE SHIELD | Admitting: Family Medicine

## 2016-11-06 VITALS — BP 118/60 | HR 83 | Temp 98.0°F | Ht 62.0 in | Wt 271.6 lb

## 2016-11-06 DIAGNOSIS — I10 Essential (primary) hypertension: Secondary | ICD-10-CM | POA: Diagnosis not present

## 2016-11-06 DIAGNOSIS — J45909 Unspecified asthma, uncomplicated: Secondary | ICD-10-CM

## 2016-11-06 DIAGNOSIS — E559 Vitamin D deficiency, unspecified: Secondary | ICD-10-CM | POA: Diagnosis not present

## 2016-11-06 DIAGNOSIS — R079 Chest pain, unspecified: Secondary | ICD-10-CM

## 2016-11-06 DIAGNOSIS — Z0001 Encounter for general adult medical examination with abnormal findings: Secondary | ICD-10-CM

## 2016-11-06 MED ORDER — ALBUTEROL SULFATE HFA 108 (90 BASE) MCG/ACT IN AERS: 2.0000 | INHALATION_SPRAY | RESPIRATORY_TRACT | 1 refills | Status: DC | PRN

## 2016-11-06 NOTE — Progress Notes (Signed)
[facility] [address], [address] [place], [place] [address] [phone]  Date:  11/06/2016   Name:  [name]   DOB:  [birth year]   MRN:  [id number]  PCP:  Abbe Amsterdam, MD    Chief Complaint: Follow-up (Pt here for f/u visit. c/o wheezing and SOB on excertion that started last Thursday)   History of Present Illness:  [name] is a 56 y.o. very pleasant female patient who presents with the following:  Seen here in October- partial HPI from that visit  Here today as a new patient.  She bring with her a 3 page typed document detailing her medical history and dx including asthma, hypothyroidism. GERD, IBS, DM, HTN, high cholesterol, FBM, RA, "heart blockage" and aortic stenosis She had PCI 10/09/2015.  Her cardiologist is D. Turner at [facility]; she had a PCA to the right RCA She was in the hospital a few times over the summer with cellulitis of her legs - she was last hospitalized in August with this.  She developed a blister across the dorsum of the left foot last month.  She is using a "wound honey" and duoderm dressings  She does have endocrinology who manages her DM  Here today to recheck her BP but also notes problems with increased wheezing over the last few days She notes that she has not really been using her qvar on a regular basis for the last 3 months or so due to forgetting to use it; she plans to do better with this. Over the last week she has been using qvar more consistently She has albuterol in an HFA and also a neb machine She did notice some chest pain associated with wheezing and coughing over the last several weeks but she does feel that this was due to MSK pain- she notes a history of chronic costochondritis She does not have CP unless she coughs, twists her trunk or presses on her chest wall.  However she also has of CAD s/p stent to the RCA.  Last seen by her cardiologist Dr. Mayford Knife in September.  She is  on brillinta for at least a year following her stent.  She is also on statin and BB  She just saw her endocrinologist and her A1c was 7.7% per her report  She also has an allergist who she will see coming up in a few weeks.   She is doing a Paleo diet in an attempt to lose weight   She never had any GYN cancers- she had a hyst due to fibroids, endometriosis. She wonders if she needs to continue pap smear testing at this time  She has not noted any fever, chills, or other sign of acute illness.  BP Readings from Last 3 Encounters:  11/06/16 118/60  10/30/16 122/74  07/03/16 138/62    Wt Readings from Last 3 Encounters:  11/06/16 271 lb 9.6 oz (123.2 kg)  07/03/16 267 lb 12.8 oz (121.5 kg)  05/31/16 267 lb 1.9 oz (121.2 kg)     Patient Active Problem List   Diagnosis Date Noted  . Edema   . Hyperbilirubinemia 04/29/2016  . Hyponatremia 04/29/2016  . Sepsis (HCC) 04/28/2016  . Cellulitis of right leg 03/09/2016  . Cellulitis of leg, right 03/09/2016  . Mild persistent asthma 02/15/2016  . Allergic rhinitis due to pollen 02/15/2016  . Anaphylactic shock due to adverse food reaction 02/15/2016  . Multiple drug allergies 02/15/2016  .  Anaphylactic reaction due to food 02/10/2016  . Severe persistent asthma 02/10/2016  . H/O mixed connective tissue disease 02/10/2016  . Type 2 diabetes mellitus with complication (HCC) 02/10/2016  . Current use of beta blocker 02/10/2016  . Coronary artery disease involving native coronary artery of native heart without angina pectoris 02/10/2016  . Gastroesophageal reflux disease without esophagitis 02/10/2016  . Immunodeficiency (HCC) 02/10/2016  . Atopic eczema 02/10/2016  . Cervical nerve root disorder 12/15/2015  . Lumbar radiculopathy 12/15/2015  . Excessive daytime sleepiness 11/26/2015  . Daytime somnolence 11/26/2015  . Hypomagnesemia 11/24/2015  . Breath shortness 11/24/2015  . Acquired hypothyroidism 10/01/2015  . Abnormal  cardiovascular stress test 09/15/2015  . Awareness of heartbeats 08/23/2015  . C. difficile colitis 03/02/2015  . C. difficile diarrhea 02/12/2015  . Diabetes mellitus without complication (HCC) 02/11/2015  . Leg edema 02/11/2015  . Nausea vomiting and diarrhea 02/10/2015  . Abnormal LFTs 03/26/2014  . B-complex deficiency 03/26/2014  . Benign essential HTN 03/26/2014  . Chronic pain associated with significant psychosocial dysfunction 03/26/2014  . Diaphragmatic hernia 03/26/2014  . Acid reflux 03/26/2014  . Hammer toe 03/26/2014  . H/O neoplasm 03/26/2014  . 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 Diabetic polyneuropathy (HCC) 03/26/2014  . Deafness, sensorineural 03/26/2014  . Type 2 diabetes mellitus (HCC) 03/26/2014  . Allergy to environmental factors 01/20/2014  . Fibromyalgia 01/20/2014  . HLD (hyperlipidemia) 01/20/2014  . Degeneration of intervertebral disc of lumbar region 01/20/2014  . 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 Avitaminosis D 01/20/2014  . Insulin dependent diabetes mellitus (HCC) 11/11/2012  . Other and unspecified hyperlipidemia 11/11/2012  . Mixed connective tissue disease (HCC) 11/11/2012  . Hypothyroidism 11/11/2012  . Asthma 11/11/2012  . 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HCC) 02/20/2012  . 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 ANA positive 07/17/2011    Past Medical History:  Diagnosis Date  . Asthma    QVAR daily and Albuterol as needed  . C. difficile diarrhea    taking Keflex and Bactrim daily  . CHF (congestive heart failure) (HCC) 09/2015   cath with normal LM, 30% LAD, 85% mid RCA and 95% distal RCA s/p PCI of the mid to distal RCA and now on DAPT with ASA and Ticagrelor.    . Chronic pain syndrome    Fentanyl Patch  . Coronary artery disease   . Diabetes mellitus    takes Metformin and Hum R daily  . Eczema   . Excessive daytime sleepiness 11/26/2015  . GERD  (gastroesophageal reflux disease)   . Hyperlipidemia   . Hypertension    takes Metoprolol and Enalapril daily  . Hypothyroidism    takes Synthroid daily  . IBS (irritable bowel syndrome)   . Mixed connective tissue disease [facility])     Past Surgical History:  Procedure Laterality Date  . ABDOMINAL HYSTERECTOMY    . APPENDECTOMY    . BREAST SURGERY    . CARDIAC CATHETERIZATION    . cardiac stents N/A 05/23/2016   pt notified department upon arrival for exam .....Marland Kitchen2 stents  . CHOLECYSTECTOMY    . CORONARY ANGIOPLASTY     normal LM, 30% LAD, 85% mid RCA and 95% distal RCA s/p PCI of the mid to distal RCA and now on DAPT with ASA and Ticagrelor.    . LEG SURGERY Left    muscle biospy  . RADIOLOGY WITH ANESTHESIA N/A 05/25/2016   Procedure: RADIOLOGY WITH ANESTHESIA;  Surgeon: Medication Radiologist, MD;  Location: [facility] OR;  Service: Radiology;  Laterality: N/A;  . WRIST SURGERY Left     Social History  Substance Use Topics  . Smoking status: Former Smoker    Packs/day: 1.00    Years: 20.00    Types: Cigarettes    Quit date: 02/11/2004  . Smokeless tobacco: Never Used  . Alcohol use Yes     Comment: once a month    Family History  Problem Relation Age of Onset  . CAD Mother   . Hypertension Mother   . Heart attack Mother   . CAD Father   . Heart attack Father   . Allergic rhinitis Father   . Asthma Father   . Hypertension Brother   . Hypertension Brother   . Pancreatic cancer Paternal Aunt   . Breast cancer Paternal Aunt     Allergies  Allergen Reactions  . Atrovent Shortness Of Breath    Wheezing becomes worse  . Fish Allergy Anaphylaxis    INCLUDES OMEGA 3 OILS  . Fish Oil Anaphylaxis and Swelling    THROAT SWELLS INCLUDES FISH AS A CLASS  . Fish Oil [Omega-3 Fatty Acids] Swelling    Throat swelling   . Ipratropium Shortness Of Breath  . Other Anaphylaxis and Other (See Comments)    UNSPECIFIED REACTION  Perch  . Crestor [Rosuvastatin] Other (See Comments)     Extreme joint pain, to this & other statins  . Gabapentin Anxiety    Anxiety on high doses  . Lovastatin Other (See Comments)    EXTREME JOINT PAIN  . Monascus Purpureus Went Yeast Other (See Comments)    Muscle pain and severe joint pain.   . Red Yeast Rice [Cholestin] Other (See Comments)    Muscle pain and severe joint pain.   . Shellfish Allergy Other (See Comments)    UNSPECIFIED REACTION   . Penicillins Rash and Other (See Comments)    UNSPECIFIED, TOLERATES KEFLEX  CHILDHOOD RX PCN CHALLENGE PER ALLERGIST, 02/29/16.  RESULTS NOT UPDATED.    Rolene Arbour [Na Sulfate-K Sulfate-Mg Sulf] Nausea And Vomiting  . Tape Other (See Comments)    Electrodes causes skin breakdown    Medication list has been reviewed and updated.  Current Outpatient Prescriptions on File Prior to Visit  Medication Sig Dispense Refill  . albuterol (PROVENTIL HFA;VENTOLIN HFA) 108 (90 Base) MCG/ACT inhaler Inhale 2 puffs into the lungs every 4 (four) hours as needed for wheezing or shortness of breath. 1 Inhaler 1  . albuterol (PROVENTIL) (2.5 MG/3ML) 0.083% nebulizer solution Inhale 2.5 mLs into the lungs every 4 (four) hours as needed for wheezing or shortness of breath. 75 mL 1  . Alpha Lipoic Acid 200 MG CAPS Take 600 mg by mouth 3 (three) times daily.    Marland Kitchen aspirin EC 81 MG tablet Take 81 mg by mouth daily.    Marland Kitchen b complex vitamins tablet Take 1 tablet by mouth daily.    . beclomethasone (QVAR) 40 MCG/ACT inhaler ONE PUFF TWICE A DAY TO PREVENT COUGH OR WHEEZE. USE SPACER. Rinse, gargle and spit out after use 1 Inhaler 5  . betamethasone dipropionate (DIPROLENE) 0.05 % cream Apply 1 application topically daily as needed (eczema).    . BRILINTA 90 MG TABS tablet Take 1 tablet (90 mg total) by mouth 2 (two) times daily. 180 tablet 1  . cetirizine (ZYRTEC) 10 MG tablet Take 10 mg by mouth daily as needed for allergies.    . clotrimazole-betamethasone (  LOTRISONE) cream Apply 1 application topically daily as  needed (eczema).    . diphenhydrAMINE (BENADRYL) 25 MG tablet Take 1 tablet (25 mg total) by mouth every 6 (six) hours as needed for itching or allergies. 10 tablet 0  . enalapril (VASOTEC) 10 MG tablet Take 1 tablet (10 mg total) by mouth 2 (two) times daily. 180 tablet 3  . EPINEPHrine (EPIPEN 2-PAK) 0.3 mg/0.3 mL IJ SOAJ injection USE AS DIRECTED FOR SEVERE ALLERGIC REACTION. 2 Device 3  . Estrogens, Conjugated (PREMARIN VA) Place 1 application vaginally 3 (three) times a week. On Saturdays, Mondays, and Wednesday (cream)    . fentaNYL (DURAGESIC - DOSED MCG/HR) 75 MCG/HR Place 75 mcg onto the skin every other day.     . fluticasone (FLONASE) 50 MCG/ACT nasal spray Place 2 sprays into both nostrils daily as needed for allergies or rhinitis. 16 g 5  . furosemide (LASIX) 40 MG tablet Take 40 mg by mouth daily as needed for fluid.     Marland Kitchen gabapentin (NEURONTIN) 100 MG capsule Take 400-700 mg by mouth See admin instructions. Pt takes 400mg  in the morning, 400mg  at lunchtime and 700mg  in the evening    . GLUCOMANNAN PO Take 700 mg by mouth 2 (two) times daily.    . Guaifenesin (MUCINEX MAXIMUM STRENGTH) 1200 MG TB12 Take 1,200 mg by mouth 2 (two) times daily.    . hydroxychloroquine (PLAQUENIL) 200 MG tablet Take 200 mg by mouth 2 (two) times daily.    . insulin regular human CONCENTRATED (HUMULIN R) 500 UNIT/ML SOLN injection Inject 0-100 Units into the skin 4 (four) times daily -  before meals and at bedtime. TID w/ meals Cbgs <125 = no insulin          125-150 = 10 units using insulin syringe ( actual dose is 50 units)          151 - 200 = 15 units using insulin syringe ( actual dose is 75 units)          > 201 = 20 units using insulin syringe ( actual dose is 100 units)  QHS Cbgs <150 = no insulin          151-200= 5 units using insulin syringe ( actual dose is 25 units)          201 - 300 = 10 units using insulin syringe ( actual dose is 50 units)          > 300 = 15 units using insulin  syringe ( actual dose is 75 units)    . Insulin Syringe-Needle U-100 (EASY TOUCH INSULIN SYRINGE) 30G X 5/16" 0.5 ML MISC     . levalbuterol (XOPENEX) 1.25 MG/0.5ML nebulizer solution Take 1 ampule by nebulization 4 (four) times daily as needed for wheezing or shortness of breath.    . levothyroxine (SYNTHROID, LEVOTHROID) 200 MCG tablet Take 200 mcg by mouth daily before breakfast.     . magnesium oxide (MAG-OX) 400 MG tablet Take 400 mg by mouth every evening.     . metFORMIN (GLUCOPHAGE-XR) 500 MG 24 hr tablet Take 1,000 mg by mouth 2 (two) times daily.  0  . metoprolol tartrate (LOPRESSOR) 25 MG tablet TAKE ONE TABLET BY MOUTH TWICE DAILY 180 tablet 2  . milk thistle 175 MG tablet Take 175 mg by mouth 2 (two) times daily.     . ondansetron (ZOFRAN) 4 MG tablet Take 1 tablet (4 mg total) by mouth every 8 (eight) hours as needed  for nausea or vomiting. 20 tablet 0  . Oxycodone HCl 10 MG TABS Take 10 mg by mouth every 4 (four) hours as needed (for pain).    Marland Kitchen oxymetazoline (AFRIN) 0.05 % nasal spray Place 1 spray into both nostrils 2 (two) times daily as needed for congestion.    Bertram Gala Glycol-Propyl Glycol (SYSTANE ULTRA OP) Apply 1 drop to eye daily as needed (dry eyes).    . promethazine (PHENERGAN) 25 MG tablet Take 25 mg by mouth every 6 (six) hours as needed for nausea or vomiting.    . saccharomyces boulardii (FLORASTOR) 250 MG capsule Take 1 capsule (250 mg total) by mouth 2 (two) times daily. 30 capsule 0  . vitamin A 16109 UNIT capsule Take 10,000 Units by mouth daily.    . vitamin B-12 (CYANOCOBALAMIN) 1000 MCG tablet Take 1,000 mcg by mouth daily.      . Vitamin D, Ergocalciferol, (DRISDOL) 50000 units CAPS capsule Take 1 capsule (50,000 Units total) by mouth once a week. Monday. 30 capsule 3  . Vitamins-Lipotropics (LIPO-FLAVONOID PLUS PO) Take 1 tablet by mouth 3 (three) times daily.     No current facility-administered medications on file prior to visit.     Review of  Systems:  As per HPI- otherwise negative.   Physical Examination: Vitals:   11/06/16 1014  BP: 118/60  Pulse: 83  Temp: 98 F (36.7 C)   Vitals:   11/06/16 1014  Weight: 271 lb 9.6 oz (123.2 kg)  Height: 5\' 2"  (1.575 m)   Body mass index is 49.68 kg/m. Ideal Body Weight: Weight in (lb) to have BMI = 25: 136.4  GEN: WDWN, NAD, Non-toxic, A & O x 3, very obese, otherwise looks well today HEENT: Atraumatic, Normocephalic. Neck supple. No masses, No LAD.  Bilateral TM wnl, oropharynx normal.  PEERL,EOMI.   Ears and Nose: No external deformity. CV: RRR, No M/G/R. No JVD. No thrill. No extra heart sounds. PULM: CTA B, no wheezes, crackles, rhonchi. No retractions. No resp. distress. No accessory muscle use.  Benign lung exam at this time I am able to easily reproduce the CP she has noted recently by pressing on her chest wall, especially along the costochondral joints ABD: S, NT, ND EXTR: No c/c/e NEURO Normal gait.  PSYCH: Normally interactive. Conversant. Not depressed or anxious appearing.  Calm demeanor.   EKG: compared with tracing from September 2017- no significant change noted. SR, no acute ST elevation or depression  Assessment and Plan: Chest pain, unspecified type - Plan: EKG 12-Lead  Essential hypertension, benign  Vitamin D deficiency  Uncomplicated asthma, unspecified asthma severity, unspecified whether persistent - Plan: albuterol (PROVENTIL HFA;VENTOLIN HFA) 108 (90 Base) MCG/ACT inhaler  Here today to discuss a few concerns She has recently noted a worsening of her wheezing, but this has been better since she started using her qvar more consistently.  Refilled her albuterol and she will continue to use her qvar.  At this time she is not wheezing or in any distress and she has IDDM so would like to avoid oral steroids if possible.  She will alert me if her respiratory situation gets worse She endorses a history of chronic MSK CP, and she had a cardiac cath  just about one year ago.  EKG is unchanged and CP is reproducible on physical exam.  Doubt that recent chest pain is indicative of any cardiac problem.  However she is urged to seek care right away if any change or worsening  of her symptoms Her BP is under control today.  Continue current medications Notes that she seems to need to stay on the 50K weekly vitamin D supplement or she will "feel terrible," this is ok for her to continue She plans to request records from some of her other doctors (pain mangt, etc) which I will review for her  Signed Abbe Amsterdam, MD

## 2016-11-06 NOTE — Patient Instructions (Addendum)
We refilled your albuterol today- continue using the Qvar, but if we need to change you to a different agent when you run out of your current supply we can do so.   Let me know if your wheezing is not better in the next few days Please set up your mammogram asap

## 2016-11-06 NOTE — Progress Notes (Signed)
Pre visit review using our clinic review tool, if applicable. No additional management support is needed unless otherwise documented below in the visit note. 

## 2016-11-27 ENCOUNTER — Ambulatory Visit: Payer: BLUE CROSS/BLUE SHIELD | Admitting: Cardiology

## 2016-12-13 ENCOUNTER — Encounter: Payer: Self-pay | Admitting: Cardiology

## 2016-12-13 ENCOUNTER — Ambulatory Visit (INDEPENDENT_AMBULATORY_CARE_PROVIDER_SITE_OTHER): Payer: BLUE CROSS/BLUE SHIELD | Admitting: Cardiology

## 2016-12-13 VITALS — BP 122/70 | HR 87 | Ht 62.0 in | Wt 271.0 lb

## 2016-12-13 DIAGNOSIS — E785 Hyperlipidemia, unspecified: Secondary | ICD-10-CM | POA: Diagnosis not present

## 2016-12-13 DIAGNOSIS — I2510 Atherosclerotic heart disease of native coronary artery without angina pectoris: Secondary | ICD-10-CM | POA: Diagnosis not present

## 2016-12-13 DIAGNOSIS — R600 Localized edema: Secondary | ICD-10-CM

## 2016-12-13 DIAGNOSIS — I10 Essential (primary) hypertension: Secondary | ICD-10-CM | POA: Diagnosis not present

## 2016-12-13 DIAGNOSIS — R079 Chest pain, unspecified: Secondary | ICD-10-CM

## 2016-12-13 NOTE — Progress Notes (Signed)
Cardiology Office Note    Date:  12/13/2016   ID:  [name], DOB Jan 01, 1961, MRN [id number]  PCP:  [name] Amsterdam, MD  Cardiologist:  [name] Magic, MD   Chief Complaint  Patient presents with  . Coronary Artery Disease  . Hypertension  . Hyperlipidemia    History of Present Illness:  [name] is a 56 y.o. female who presents for followup of CAD. She has a history of ASCAD with normal LM, 30% LAD, 85% mid RCA and 95% distal RCA s/p PCI of the mid to distal RCA and now on DAPT with ASA and Ticagrelor. She also has HTN, hyperlipidemia and DM.  She presents back today for followup and is doing well. She denies any  dizziness, palpitations or syncope. She has chronic SOB with her asthma which is stable.  She is very frustrated that her weight is up.  She thinks part of her weight increase is from the ham she has eaten in the past few days.  She says that recently when lying down she will get SOB.  She is not sure that it may be her asthma but says that this is similar to when she had her cath in the past.  She has had some chest discomfort on her chest wall but no exertional CP and her anginal equivalent in the past was SOB.  She has gained at least 10 lbs since Thanksgiving.  She denies any GERD symptoms. No coughing or heartburn.  She has noticed more LE edema in the past few days again which she thinks is due to increased sodium in her diet in the past week.  Past Medical History:  Diagnosis Date  . Asthma    QVAR daily and Albuterol as needed  . C. difficile diarrhea    taking Keflex and Bactrim daily  . Chronic diastolic CHF (congestive heart failure) (HCC) 09/2015  . Chronic pain syndrome    Fentanyl Patch  . Coronary artery disease    cath with normal LM, 30% LAD, 85% mid RCA and 95% distal RCA s/p PCI of the mid to distal RCA and now on DAPT with ASA and Ticagrelor.    . Diabetes mellitus    takes Metformin and Hum R daily  . Eczema   . Excessive daytime sleepiness  11/26/2015  . GERD (gastroesophageal reflux disease)   . Hyperlipidemia   . Hypertension    takes Metoprolol and Enalapril daily  . Hypothyroidism    takes Synthroid daily  . IBS (irritable bowel syndrome)   . Mixed connective tissue disease [facility])     Past Surgical History:  Procedure Laterality Date  . ABDOMINAL HYSTERECTOMY    . APPENDECTOMY    . BREAST SURGERY    . CARDIAC CATHETERIZATION    . cardiac stents N/A 05/23/2016   pt notified department upon arrival for exam .....Marland Kitchen2 stents  . CHOLECYSTECTOMY    . CORONARY ANGIOPLASTY     normal LM, 30% LAD, 85% mid RCA and 95% distal RCA s/p PCI of the mid to distal RCA and now on DAPT with ASA and Ticagrelor.    . LEG SURGERY Left    muscle biospy  . RADIOLOGY WITH ANESTHESIA N/A 05/25/2016   Procedure: RADIOLOGY WITH ANESTHESIA;  Surgeon: Medication Radiologist, MD;  Location: [facility] OR;  Service: Radiology;  Laterality: N/A;  . WRIST SURGERY Left     Current Medications: Current Meds  Medication Sig  . albuterol (PROVENTIL HFA;VENTOLIN HFA) 108 (90 Base) MCG/ACT inhaler Inhale  2 puffs into the lungs every 4 (four) hours as needed for wheezing or shortness of breath.  Marland Kitchen albuterol (PROVENTIL) (2.5 MG/3ML) 0.083% nebulizer solution Inhale 2.5 mLs into the lungs every 4 (four) hours as needed for wheezing or shortness of breath.  Marland Kitchen aspirin EC 81 MG tablet Take 81 mg by mouth daily.  Marland Kitchen b complex vitamins tablet Take 1 tablet by mouth daily.  . beclomethasone (QVAR) 40 MCG/ACT inhaler ONE PUFF TWICE A DAY TO PREVENT COUGH OR WHEEZE. USE SPACER. Rinse, gargle and spit out after use  . betamethasone dipropionate (DIPROLENE) 0.05 % cream Apply 1 application topically daily as needed (eczema).  . BRILINTA 90 MG TABS tablet Take 1 tablet (90 mg total) by mouth 2 (two) times daily.  . cetirizine (ZYRTEC) 10 MG tablet Take 10 mg by mouth daily as needed for allergies.  . clotrimazole-betamethasone (LOTRISONE) cream Apply 1 application topically  daily as needed (eczema).  . diphenhydrAMINE (BENADRYL) 25 MG tablet Take 1 tablet (25 mg total) by mouth every 6 (six) hours as needed for itching or allergies.  Marland Kitchen enalapril (VASOTEC) 10 MG tablet Take 1 tablet (10 mg total) by mouth 2 (two) times daily.  Marland Kitchen EPINEPHrine (EPIPEN 2-PAK) 0.3 mg/0.3 mL IJ SOAJ injection USE AS DIRECTED FOR SEVERE ALLERGIC REACTION.  . Estrogens, Conjugated (PREMARIN VA) Place 1 application vaginally 3 (three) times a week. On Saturdays, Mondays, and Wednesday (cream)  . fentaNYL (DURAGESIC - DOSED MCG/HR) 75 MCG/HR Place 75 mcg onto the skin every other day.   . fluticasone (FLONASE) 50 MCG/ACT nasal spray Place 2 sprays into both nostrils daily as needed for allergies or rhinitis.  . furosemide (LASIX) 40 MG tablet Take 40 mg by mouth daily as needed for fluid.   Marland Kitchen gabapentin (NEURONTIN) 100 MG capsule Take 400-700 mg by mouth See admin instructions. Pt takes 400mg  in the morning, 400mg  at lunchtime and 700mg  in the evening  . GLUCOMANNAN PO Take 700 mg by mouth 2 (two) times daily.  . Guaifenesin (MUCINEX MAXIMUM STRENGTH) 1200 MG TB12 Take 1,200 mg by mouth 2 (two) times daily.  . hydroxychloroquine (PLAQUENIL) 200 MG tablet Take 200 mg by mouth 2 (two) times daily.  . insulin regular human CONCENTRATED (HUMULIN R) 500 UNIT/ML SOLN injection Inject 0-100 Units into the skin 4 (four) times daily -  before meals and at bedtime. TID w/ meals Cbgs <125 = no insulin          125-150 = 10 units using insulin syringe ( actual dose is 50 units)          151 - 200 = 15 units using insulin syringe ( actual dose is 75 units)          > 201 = 20 units using insulin syringe ( actual dose is 100 units)  QHS Cbgs <150 = no insulin          151-200= 5 units using insulin syringe ( actual dose is 25 units)          201 - 300 = 10 units using insulin syringe ( actual dose is 50 units)          > 300 = 15 units using insulin syringe ( actual dose is 75 units)  . Insulin  Syringe-Needle U-100 (EASY TOUCH INSULIN SYRINGE) 30G X 5/16" 0.5 ML MISC   . levalbuterol (XOPENEX) 1.25 MG/0.5ML nebulizer solution Take 1 ampule by nebulization 4 (four) times daily as needed for wheezing or shortness of  breath.  . levothyroxine (SYNTHROID, LEVOTHROID) 200 MCG tablet Take 200 mcg by mouth daily before breakfast.   . magnesium oxide (MAG-OX) 400 MG tablet Take 400 mg by mouth every evening.   . metFORMIN (GLUCOPHAGE-XR) 500 MG 24 hr tablet Take 1,000 mg by mouth 2 (two) times daily.  . metoprolol tartrate (LOPRESSOR) 25 MG tablet TAKE ONE TABLET BY MOUTH TWICE DAILY  . milk thistle 175 MG tablet Take 175 mg by mouth 2 (two) times daily.   . ondansetron (ZOFRAN) 4 MG tablet Take 1 tablet (4 mg total) by mouth every 8 (eight) hours as needed for nausea or vomiting.  . Oxycodone HCl 10 MG TABS Take 10 mg by mouth every 4 (four) hours as needed (for pain).  Marland Kitchen oxymetazoline (AFRIN) 0.05 % nasal spray Place 1 spray into both nostrils 2 (two) times daily as needed for congestion.  Bertram Gala Glycol-Propyl Glycol (SYSTANE ULTRA OP) Apply 1 drop to eye daily as needed (dry eyes).  . promethazine (PHENERGAN) 25 MG tablet Take 25 mg by mouth every 6 (six) hours as needed for nausea or vomiting.  . saccharomyces boulardii (FLORASTOR) 250 MG capsule Take 1 capsule (250 mg total) by mouth 2 (two) times daily.  . vitamin A 74081 UNIT capsule Take 10,000 Units by mouth daily.  . vitamin B-12 (CYANOCOBALAMIN) 1000 MCG tablet Take 1,000 mcg by mouth daily.    . Vitamin D, Ergocalciferol, (DRISDOL) 50000 units CAPS capsule Take 1 capsule (50,000 Units total) by mouth once a week. Monday.  . Vitamins-Lipotropics (LIPO-FLAVONOID PLUS PO) Take 1 tablet by mouth 3 (three) times daily.    Allergies:   Atrovent; Fish allergy; Fish oil; Fish oil [omega-3 fatty acids]; Ipratropium; Other; Crestor [rosuvastatin]; Gabapentin; Lovastatin; Monascus purpureus went yeast; Red yeast rice [cholestin]; Shellfish  allergy; Penicillins; Suprep [na sulfate-k sulfate-mg sulf]; and Tape   Social History   Social History  . Marital status: Married    Spouse name: N/A  . Number of children: N/A  . Years of education: N/A   Social History Main Topics  . Smoking status: Former Smoker    Packs/day: 1.00    Years: 20.00    Types: Cigarettes    Quit date: 02/11/2004  . Smokeless tobacco: Never Used  . Alcohol use Yes     Comment: once a month  . Drug use: No  . Sexual activity: Yes    Birth control/ protection: Surgical   Other Topics Concern  . None   Social History Narrative  . None     Family History:  The patient's family history includes Allergic rhinitis in her father; Asthma in her father; Breast cancer in her paternal aunt; CAD in her father and mother; Heart attack in her father and mother; Hypertension in her brother, brother, and mother; Pancreatic cancer in her paternal aunt.   ROS:   Please see the history of present illness.    ROS All other systems reviewed and are negative.  No flowsheet data found.     PHYSICAL EXAM:   VS:  BP 122/70   Pulse 87   Ht 5\' 2"  (1.575 m)   Wt 271 lb (122.9 kg)   SpO2 96%   BMI 49.57 kg/m    GEN: Well nourished, well developed, in no acute distress  HEENT: normal  Neck: no JVD, carotid bruits, or masses Cardiac: RRR; no murmurs, rubs, or gallops.  1+ LE edema.  Intact distal pulses bilaterally.  Respiratory:  clear to auscultation bilaterally,  normal work of breathing GI: soft, nontender, nondistended, + BS MS: no deformity or atrophy  Skin: warm and dry, no rash Neuro:  Alert and Oriented x 3, Strength and sensation are intact Psych: euthymic mood, full affect  Wt Readings from Last 3 Encounters:  12/13/16 271 lb (122.9 kg)  11/06/16 271 lb 9.6 oz (123.2 kg)  07/03/16 267 lb 12.8 oz (121.5 kg)      Studies/Labs Reviewed:   EKG:  EKG is not ordered today.    Recent Labs: 03/09/2016: TSH 0.028 04/30/2016: ALT 26 05/16/2016: BUN  14; Creatinine, Ser 0.92; Hemoglobin 13.3; Platelets 270; Potassium 4.6; Sodium 136   Lipid Panel    Component Value Date/Time   CHOL 191 02/11/2015 0310   TRIG 140 02/11/2015 0310   HDL 37 (L) 02/11/2015 0310   CHOLHDL 5.2 02/11/2015 0310   VLDL 28 02/11/2015 0310   LDLCALC 126 (H) 02/11/2015 0310    Additional studies/ records that were reviewed today include:  none    ASSESSMENT:    1. Coronary artery disease involving native coronary artery of native heart without angina pectoris   2. Benign essential HTN   3. Hyperlipidemia LDL goal <70   4. Edema extremities      PLAN:  In order of problems listed above:  1. ASCAD - cath showed  normal LM, 30% LAD, 85% mid RCA and 95% distal RCA s/p PCI of the mid to distal RCA and now on DAPT with ASA and Ticagrelor.  She has been having SOB at rest which is similar to her past angina.  I have recommended that we get a Lexiscan myoview to rule out ischemia.  She will continue on BB as well as DAPT. 2. HTN - BP controlled on current meds.  She will continue on BB, ACE I . 3. Hyperlipidemia - her LDL goal is < 70. She is statin intolerant.  I will refer her to lipid clinic to see if she is a candidate for PCSK 9 drug. 4. LE edema - I suspect due to dietary indiscretion - I have counseled her on a low sodium diet.  She will continue on PRN diuretics.     Medication Adjustments/Labs and Tests Ordered: Current medicines are reviewed at length with the patient today.  Concerns regarding medicines are outlined above.  Medication changes, Labs and Tests ordered today are listed in the Patient Instructions below.  There are no Patient Instructions on file for this visit.   Signed, [name] Magic, MD  12/13/2016 1:44 PM    [facility] [address] [place], [place], [place]  [address] Phone: [phone]; Fax: [phone]

## 2016-12-13 NOTE — Patient Instructions (Signed)
Medication Instructions:  Your physician recommends that you continue on your current medications as directed. Please refer to the Current Medication list given to you today.   Labwork: Your physician recommends that you return for lab work in: CAN BE SCHEDULED FOR SAME DAY AS LEXI   Testing/Procedures: Your physician has requested that you have a lexiscan myoview. For further information please visit [email or web address] Please follow instruction sheet, as given.   Follow-Up: Your physician recommends that you schedule a follow-up appointment in: Lipid Clinic    Your physician wants you to follow-up in: 12 months with Dr. Sherlyn Lick will receive a reminder letter in the mail two months in advance. If you don't receive a letter, please call our office to schedule the follow-up appointment.   Any Other Special Instructions Will Be Listed Below (If Applicable).     If you need a refill on your cardiac medications before your next appointment, please call your pharmacy.

## 2016-12-14 ENCOUNTER — Telehealth (HOSPITAL_COMMUNITY): Payer: Self-pay | Admitting: *Deleted

## 2016-12-14 ENCOUNTER — Ambulatory Visit (INDEPENDENT_AMBULATORY_CARE_PROVIDER_SITE_OTHER): Payer: BLUE CROSS/BLUE SHIELD | Admitting: Family Medicine

## 2016-12-14 VITALS — BP 124/72 | HR 93 | Temp 98.0°F | Ht 62.0 in | Wt 268.4 lb

## 2016-12-14 DIAGNOSIS — K047 Periapical abscess without sinus: Secondary | ICD-10-CM | POA: Diagnosis not present

## 2016-12-14 MED ORDER — CEPHALEXIN 500 MG PO CAPS: 500.0000 mg | ORAL_CAPSULE | Freq: Two times a day (BID) | ORAL | 0 refills | Status: DC

## 2016-12-14 MED ORDER — METRONIDAZOLE 500 MG PO TABS: 500.0000 mg | ORAL_TABLET | Freq: Two times a day (BID) | ORAL | 0 refills | Status: DC

## 2016-12-14 MED FILL — metroNIDAZOLE 500 MG TABS: 500 | 7 days supply | Qty: 14 | Fill #0

## 2016-12-14 MED FILL — CEPHALEXIN 500 MG CAPSULE: 500 | 7 days supply | Qty: 14 | Fill #0

## 2016-12-14 NOTE — Telephone Encounter (Signed)
Left message on voicemail per DPR in reference to upcoming appointment scheduled on 12/18/16 with detailed instructions given per Myocardial Perfusion Study Information Sheet for the test. LM to arrive 15 minutes early, and that it is imperative to arrive on time for appointment to keep from having the test rescheduled. If you need to cancel or reschedule your appointment, please call the office within 24 hours of your appointment. Failure to do so may result in a cancellation of your appointment, and a $50 no show fee. Phone number given for call back for any questions. [name]

## 2016-12-14 NOTE — Progress Notes (Signed)
[name] [address] [place], [place] [address] [phone]  Date:  12/14/2016   Name:  [name]   DOB:  07/27/1961   MRN:  [id number]  PCP:  [name] Amsterdam, MD    Chief Complaint: Infected tooth (c/o infected tooth.)   History of Present Illness:  [name] is a 56 y.o. very pleasant female patient who presents with the following:  History of multiple medical problems as below. Here today with complaint of dental infection She was seen by her cardiologist Dr.Turner yesterday who encouraged her to decrease sodium intake  Next week she is having a stress test- she thinks on Monday or Tuesday  She has a painful tooth in her right mandible.   It has been bothering her for approx 6 months- off an on- but it is worse now.  This tooth has been broken off for some time but more recently has become more painful and tender- also there is some swelling of her gums, and this am she noted some "blood and pus" from the area when she brushed.    She did have c dif in the past- she was hospitalized several times last year with cellulitis in her leg and tested positive for C diff in August.  Also positive in 2016  No active diarrhea at this time  She has several drug allergies including penicillin - she thinks her reaction was a rash.  However she is able to tolerated cephalosporins like keflex  No known fever or other systemic sx at this time She had cortisone shots in her back and neck this am    Patient Active Problem List   Diagnosis Date Noted  . Hyperbilirubinemia 04/29/2016  . Hyponatremia 04/29/2016  . Sepsis (HCC) 04/28/2016  . Cellulitis of leg, right 03/09/2016  . Mild persistent asthma 02/15/2016  . Allergic rhinitis due to pollen 02/15/2016  . Anaphylactic reaction due to food 02/10/2016  . Coronary artery disease involving native coronary artery of native heart without angina pectoris 02/10/2016  .  Gastroesophageal reflux disease without esophagitis 02/10/2016  . Atopic eczema 02/10/2016  . Cervical nerve root disorder 12/15/2015  . Lumbar radiculopathy 12/15/2015  . Daytime somnolence 11/26/2015  . Abnormal cardiovascular stress test 09/15/2015  . C. difficile diarrhea 02/12/2015  . Edema extremities 02/11/2015  . Abnormal LFTs 03/26/2014  . B-complex deficiency 03/26/2014  . Benign essential HTN 03/26/2014  . Chronic pain associated with significant psychosocial dysfunction 03/26/2014  . Diaphragmatic hernia 03/26/2014  . Acid reflux 03/26/2014  . Hammer toe 03/26/2014  . H/O neoplasm 03/26/2014  . 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 Diabetic polyneuropathy (HCC) 03/26/2014  . Deafness, sensorineural 03/26/2014  . Allergy to environmental factors 01/20/2014  . Fibromyalgia 01/20/2014  . 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 Insulin dependent diabetes mellitus (HCC) 11/11/2012  . Hyperlipidemia LDL goal <70 11/11/2012  . Mixed connective tissue disease (HCC) 11/11/2012  . Hypothyroidism 11/11/2012  . 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HCC) 02/20/2012  . 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 ANA positive 07/17/2011    Past Medical History:  Diagnosis Date  . Asthma    QVAR daily and Albuterol as needed  . C. difficile diarrhea    taking Keflex and Bactrim daily  . Chronic diastolic CHF (congestive heart failure) (HCC) 09/2015  . Chronic pain syndrome    Fentanyl Patch  . Coronary artery disease  cath with normal LM, 30% LAD, 85% mid RCA and 95% distal RCA s/p PCI of the mid to distal RCA and now on DAPT with ASA and Ticagrelor.    . Diabetes mellitus    takes Metformin and Hum R daily  . Eczema   . Excessive daytime sleepiness 11/26/2015  . GERD (gastroesophageal reflux disease)   . Hyperlipidemia   . Hypertension    takes Metoprolol and Enalapril daily  . Hypothyroidism    takes  Synthroid daily  . IBS (irritable bowel syndrome)   . Mixed connective tissue disease [name])     Past Surgical History:  Procedure Laterality Date  . ABDOMINAL HYSTERECTOMY    . APPENDECTOMY    . BREAST SURGERY    . CARDIAC CATHETERIZATION    . cardiac stents N/A 05/23/2016   pt notified department upon arrival for exam .....Marland Kitchen2 stents  . CHOLECYSTECTOMY    . CORONARY ANGIOPLASTY     normal LM, 30% LAD, 85% mid RCA and 95% distal RCA s/p PCI of the mid to distal RCA and now on DAPT with ASA and Ticagrelor.    . LEG SURGERY Left    muscle biospy  . RADIOLOGY WITH ANESTHESIA N/A 05/25/2016   Procedure: RADIOLOGY WITH ANESTHESIA;  Surgeon: Medication Radiologist, MD;  Location: [facility] OR;  Service: Radiology;  Laterality: N/A;  . WRIST SURGERY Left     Social History  Substance Use Topics  . Smoking status: Former Smoker    Packs/day: 1.00    Years: 20.00    Types: Cigarettes    Quit date: 02/11/2004  . Smokeless tobacco: Never Used  . Alcohol use Yes     Comment: once a month    Family History  Problem Relation Age of Onset  . CAD Mother   . Hypertension Mother   . Heart attack Mother   . CAD Father   . Heart attack Father   . Allergic rhinitis Father   . Asthma Father   . Hypertension Brother   . Hypertension Brother   . Pancreatic cancer Paternal Aunt   . Breast cancer Paternal Aunt     Allergies  Allergen Reactions  . Atrovent Shortness Of Breath    Wheezing becomes worse  . Fish Allergy Anaphylaxis    INCLUDES OMEGA 3 OILS  . Fish Oil Anaphylaxis and Swelling    THROAT SWELLS INCLUDES FISH AS A CLASS  . Fish Oil [Omega-3 Fatty Acids] Swelling    Throat swelling   . Ipratropium Shortness Of Breath  . Other Anaphylaxis and Other (See Comments)    UNSPECIFIED REACTION  Perch  . Crestor [Rosuvastatin] Other (See Comments)    Extreme joint pain, to this & other statins  . Gabapentin Anxiety    Anxiety on high doses  . Lovastatin Other (See Comments)     EXTREME JOINT PAIN  . Monascus Purpureus Went Yeast Other (See Comments)    Muscle pain and severe joint pain.   . Red Yeast Rice [Cholestin] Other (See Comments)    Muscle pain and severe joint pain.   . Shellfish Allergy Other (See Comments)    UNSPECIFIED REACTION   . Penicillins Rash and Other (See Comments)    UNSPECIFIED, TOLERATES KEFLEX  CHILDHOOD RX PCN CHALLENGE PER ALLERGIST, 02/29/16.  RESULTS NOT UPDATED.    Rolene Arbour [Na Sulfate-K Sulfate-Mg Sulf] Nausea And Vomiting  . Tape Other (See Comments)    Electrodes causes skin breakdown    Medication list has been reviewed  and updated.  Current Outpatient Prescriptions on File Prior to Visit  Medication Sig Dispense Refill  . albuterol (PROVENTIL HFA;VENTOLIN HFA) 108 (90 Base) MCG/ACT inhaler Inhale 2 puffs into the lungs every 4 (four) hours as needed for wheezing or shortness of breath. 1 Inhaler 1  . albuterol (PROVENTIL) (2.5 MG/3ML) 0.083% nebulizer solution Inhale 2.5 mLs into the lungs every 4 (four) hours as needed for wheezing or shortness of breath. 75 mL 1  . aspirin EC 81 MG tablet Take 81 mg by mouth daily.    Marland Kitchen b complex vitamins tablet Take 1 tablet by mouth daily.    . beclomethasone (QVAR) 40 MCG/ACT inhaler ONE PUFF TWICE A DAY TO PREVENT COUGH OR WHEEZE. USE SPACER. Rinse, gargle and spit out after use 1 Inhaler 5  . betamethasone dipropionate (DIPROLENE) 0.05 % cream Apply 1 application topically daily as needed (eczema).    . BRILINTA 90 MG TABS tablet Take 1 tablet (90 mg total) by mouth 2 (two) times daily. 180 tablet 1  . cetirizine (ZYRTEC) 10 MG tablet Take 10 mg by mouth daily as needed for allergies.    . clotrimazole-betamethasone (LOTRISONE) cream Apply 1 application topically daily as needed (eczema).    . diphenhydrAMINE (BENADRYL) 25 MG tablet Take 1 tablet (25 mg total) by mouth every 6 (six) hours as needed for itching or allergies. 10 tablet 0  . enalapril (VASOTEC) 10 MG tablet Take 1 tablet  (10 mg total) by mouth 2 (two) times daily. 180 tablet 3  . EPINEPHrine (EPIPEN 2-PAK) 0.3 mg/0.3 mL IJ SOAJ injection USE AS DIRECTED FOR SEVERE ALLERGIC REACTION. 2 Device 3  . Estrogens, Conjugated (PREMARIN VA) Place 1 application vaginally 3 (three) times a week. On Saturdays, Mondays, and Wednesday (cream)    . fentaNYL (DURAGESIC - DOSED MCG/HR) 75 MCG/HR Place 75 mcg onto the skin every other day.     . fluticasone (FLONASE) 50 MCG/ACT nasal spray Place 2 sprays into both nostrils daily as needed for allergies or rhinitis. 16 g 5  . furosemide (LASIX) 40 MG tablet Take 40 mg by mouth daily as needed for fluid.     Marland Kitchen gabapentin (NEURONTIN) 100 MG capsule Take 400-700 mg by mouth See admin instructions. Pt takes 400mg  in the morning, 400mg  at lunchtime and 700mg  in the evening    . GLUCOMANNAN PO Take 700 mg by mouth 2 (two) times daily.    . Guaifenesin (MUCINEX MAXIMUM STRENGTH) 1200 MG TB12 Take 1,200 mg by mouth 2 (two) times daily.    . hydroxychloroquine (PLAQUENIL) 200 MG tablet Take 200 mg by mouth 2 (two) times daily.    . insulin regular human CONCENTRATED (HUMULIN R) 500 UNIT/ML SOLN injection Inject 0-100 Units into the skin 4 (four) times daily -  before meals and at bedtime. TID w/ meals Cbgs <125 = no insulin          125-150 = 10 units using insulin syringe ( actual dose is 50 units)          151 - 200 = 15 units using insulin syringe ( actual dose is 75 units)          > 201 = 20 units using insulin syringe ( actual dose is 100 units)  QHS Cbgs <150 = no insulin          151-200= 5 units using insulin syringe ( actual dose is 25 units)          201 - 300 =  10 units using insulin syringe ( actual dose is 50 units)          > 300 = 15 units using insulin syringe ( actual dose is 75 units)    . Insulin Syringe-Needle U-100 (EASY TOUCH INSULIN SYRINGE) 30G X 5/16" 0.5 ML MISC     . levalbuterol (XOPENEX) 1.25 MG/0.5ML nebulizer solution Take 1 ampule by nebulization 4 (four)  times daily as needed for wheezing or shortness of breath.    . levothyroxine (SYNTHROID, LEVOTHROID) 200 MCG tablet Take 200 mcg by mouth daily before breakfast.     . magnesium oxide (MAG-OX) 400 MG tablet Take 400 mg by mouth every evening.     . metFORMIN (GLUCOPHAGE-XR) 500 MG 24 hr tablet Take 1,000 mg by mouth 2 (two) times daily.  0  . metoprolol tartrate (LOPRESSOR) 25 MG tablet TAKE ONE TABLET BY MOUTH TWICE DAILY 180 tablet 2  . milk thistle 175 MG tablet Take 175 mg by mouth 2 (two) times daily.     . ondansetron (ZOFRAN) 4 MG tablet Take 1 tablet (4 mg total) by mouth every 8 (eight) hours as needed for nausea or vomiting. 20 tablet 0  . Oxycodone HCl 10 MG TABS Take 10 mg by mouth every 4 (four) hours as needed (for pain).    Marland Kitchen oxymetazoline (AFRIN) 0.05 % nasal spray Place 1 spray into both nostrils 2 (two) times daily as needed for congestion.    Bertram Gala Glycol-Propyl Glycol (SYSTANE ULTRA OP) Apply 1 drop to eye daily as needed (dry eyes).    . promethazine (PHENERGAN) 25 MG tablet Take 25 mg by mouth every 6 (six) hours as needed for nausea or vomiting.    . saccharomyces boulardii (FLORASTOR) 250 MG capsule Take 1 capsule (250 mg total) by mouth 2 (two) times daily. 30 capsule 0  . vitamin A 16109 UNIT capsule Take 10,000 Units by mouth daily.    . vitamin B-12 (CYANOCOBALAMIN) 1000 MCG tablet Take 1,000 mcg by mouth daily.      . Vitamin D, Ergocalciferol, (DRISDOL) 50000 units CAPS capsule Take 1 capsule (50,000 Units total) by mouth once a week. Monday. 30 capsule 3  . Vitamins-Lipotropics (LIPO-FLAVONOID PLUS PO) Take 1 tablet by mouth 3 (three) times daily.     No current facility-administered medications on file prior to visit.     Review of Systems: No rash, fever, SOB, CP As per HPI- otherwise negative.   Physical Examination: Vitals:   12/14/16 1043  BP: 124/72  Pulse: 93  Temp: 98 F (36.7 C)   Vitals:   12/14/16 1043  Weight: 268 lb 6.4 oz (121.7  kg)  Height: 5\' 2"  (1.575 m)   Body mass index is 49.09 kg/m. Ideal Body Weight: Weight in (lb) to have BMI = 25: 136.4  GEN: WDWN, NAD, Non-toxic, A & O x 3, morbid obesity, otherwise looks well HEENT: Atraumatic, Normocephalic. Neck supple. No masses, No LAD.  Bilateral TM wnl, oropharynx normal.  PEERL,EOMI.    Poor dentition with many missing and repaired teeth The right mandible has several missing teeth.  ?#6 is broken off at the base with a necrotic appearing center/ root and a small piece of tooth retained in the gum.  She is tender and the adjacent guys are mildly swollen.  No heat, redness or abscess however  Ears and Nose: No external deformity. CV: RRR, No M/G/R. No JVD. No thrill. No extra heart sounds. PULM: CTA B, no wheezes, crackles,  rhonchi. No retractions. No resp. distress. No accessory muscle use. EXTR: No c/c/e NEURO Normal gait.  PSYCH: Normally interactive. Conversant. Not depressed or anxious appearing.  Calm demeanor.    Assessment and Plan: Dental infection - Plan: cephALEXin (KEFLEX) 500 MG capsule, metroNIDAZOLE (FLAGYL) 500 MG tablet  Here today with apparent dental infection Encouraged her to see DDS asap and she plans to do so She is somewhat difficult to treat as she is pcn allergic and has had C diff- would like to avoid clindamycin.  Decided to use combination of keflex and flagyl for her She will alert [place] if not feeling better soon- Sooner if worse.    Signed [name] Amsterdam, MD

## 2016-12-14 NOTE — Patient Instructions (Signed)
We are going to treat you with keflex and flagyl twice a day for one week Please see your dentist as soon as you can, and let us know if your tooth does not seem to be getting better

## 2016-12-18 ENCOUNTER — Ambulatory Visit (HOSPITAL_COMMUNITY): Payer: BLUE CROSS/BLUE SHIELD | Attending: Cardiovascular Disease

## 2016-12-18 ENCOUNTER — Other Ambulatory Visit: Payer: BLUE CROSS/BLUE SHIELD | Admitting: *Deleted

## 2016-12-18 DIAGNOSIS — R9439 Abnormal result of other cardiovascular function study: Secondary | ICD-10-CM | POA: Insufficient documentation

## 2016-12-18 DIAGNOSIS — R600 Localized edema: Secondary | ICD-10-CM

## 2016-12-18 DIAGNOSIS — I10 Essential (primary) hypertension: Secondary | ICD-10-CM

## 2016-12-18 DIAGNOSIS — I2510 Atherosclerotic heart disease of native coronary artery without angina pectoris: Secondary | ICD-10-CM | POA: Diagnosis not present

## 2016-12-18 DIAGNOSIS — E785 Hyperlipidemia, unspecified: Secondary | ICD-10-CM | POA: Diagnosis not present

## 2016-12-18 DIAGNOSIS — E119 Type 2 diabetes mellitus without complications: Secondary | ICD-10-CM | POA: Diagnosis not present

## 2016-12-18 DIAGNOSIS — R079 Chest pain, unspecified: Secondary | ICD-10-CM | POA: Diagnosis not present

## 2016-12-18 DIAGNOSIS — Z794 Long term (current) use of insulin: Secondary | ICD-10-CM | POA: Insufficient documentation

## 2016-12-18 DIAGNOSIS — R0602 Shortness of breath: Secondary | ICD-10-CM | POA: Insufficient documentation

## 2016-12-18 MED ORDER — TECHNETIUM TC 99M TETROFOSMIN IV KIT
33.0000 | PACK | Freq: Once | INTRAVENOUS | Status: AC | PRN
  Administered 2016-12-18: 33 via INTRAVENOUS
  Filled 2016-12-18: qty 33

## 2016-12-18 MED ORDER — REGADENOSON 0.4 MG/5ML IV SOLN
0.4000 mg | Freq: Once | INTRAVENOUS | Status: AC
  Administered 2016-12-18: 0.4 mg via INTRAVENOUS

## 2016-12-19 ENCOUNTER — Ambulatory Visit (HOSPITAL_COMMUNITY): Payer: BLUE CROSS/BLUE SHIELD | Attending: Internal Medicine

## 2016-12-19 LAB — MYOCARDIAL PERFUSION IMAGING
LV dias vol: 99 mL (ref 46–106)
LV sys vol: 38 mL
Peak HR: 105 {beats}/min
RATE: 0.34
Rest HR: 65 {beats}/min
SDS: 6
SRS: 4
SSS: 10
TID: 1

## 2016-12-19 LAB — BASIC METABOLIC PANEL
BUN/Creatinine Ratio: 19 (ref 9–23)
BUN: 13 mg/dL (ref 6–24)
CO2: 24 mmol/L (ref 18–29)
Calcium: 9.9 mg/dL (ref 8.7–10.2)
Chloride: 97 mmol/L (ref 96–106)
Creatinine, Ser: 0.7 mg/dL (ref 0.57–1.00)
GFR calc Af Amer: 113 mL/min/{1.73_m2} (ref >59)
GFR calc non Af Amer: 98 mL/min/{1.73_m2} (ref >59)
Glucose: 140 mg/dL — ABNORMAL HIGH (ref 65–99)
Potassium: 4.5 mmol/L (ref 3.5–5.2)
Sodium: 141 mmol/L (ref 134–144)

## 2016-12-19 LAB — LIPID PANEL
Chol/HDL Ratio: 2.9 ratio (ref 0.0–4.4)
Cholesterol, Total: 147 mg/dL (ref 100–199)
HDL: 50 mg/dL (ref >39)
LDL Calculated: 66 mg/dL (ref 0–99)
Triglycerides: 154 mg/dL — ABNORMAL HIGH (ref 0–149)
VLDL Cholesterol Cal: 31 mg/dL (ref 5–40)

## 2016-12-19 LAB — HEPATIC FUNCTION PANEL
ALT: 39 IU/L — ABNORMAL HIGH (ref 0–32)
AST: 32 IU/L (ref 0–40)
Albumin: 4.5 g/dL (ref 3.5–5.5)
Alkaline Phosphatase: 71 IU/L (ref 39–117)
Bilirubin Total: 0.4 mg/dL (ref 0.0–1.2)
Bilirubin, Direct: 0.13 mg/dL (ref 0.00–0.40)
Total Protein: 7 g/dL (ref 6.0–8.5)

## 2016-12-19 MED ORDER — TECHNETIUM TC 99M TETROFOSMIN IV KIT
33.0000 | PACK | Freq: Once | INTRAVENOUS | Status: AC | PRN
  Administered 2016-12-19: 33 via INTRAVENOUS
  Filled 2016-12-19: qty 33

## 2016-12-21 ENCOUNTER — Ambulatory Visit: Payer: BLUE CROSS/BLUE SHIELD | Admitting: Family Medicine

## 2016-12-21 ENCOUNTER — Telehealth: Payer: Self-pay | Admitting: Family Medicine

## 2016-12-21 NOTE — Telephone Encounter (Signed)
Patient cancelled her 1:15pm appointment for c-diff, patient states she will go to her infectious disease doctor, charge or no charge

## 2016-12-21 NOTE — Telephone Encounter (Signed)
No charge. 

## 2016-12-27 ENCOUNTER — Ambulatory Visit: Payer: BLUE CROSS/BLUE SHIELD

## 2016-12-27 NOTE — Progress Notes (Unsigned)
Patient ID: [name]                 DOB: 20-Oct-1960                    MRN: [id number]     HPI: [name] is a 56 y.o. female patient referred to lipid clinic by Dr Mayford Knife. PMH is significant for CAD with 30% LAD, 85% mid RCA, and 95% distal RCA s/p PCI of the mid to distal RCA, HTN, HLD, asthma, and DM.    [employer] insurance  Current Medications: none Intolerances: Crestor 40mg  daily Risk Factors: CAD s/p PCI, DM LDL goal: 70mg /dL  Diet:   Exercise:   Family History: The patient's family history includes Allergic rhinitis in her father; Asthma in her father; Breast cancer in her paternal aunt; CAD in her father and mother; Heart attack in her father and mother; Hypertension in her brother, brother, and mother; Pancreatic cancer in her paternal aunt.   Social History: Former smoker 1 PPD for 20 years, quit in 2005. Rarely drinks alcohol, denies illicit drug use.  Labs: LDL 66  Past Medical History:  Diagnosis Date  . Asthma    QVAR daily and Albuterol as needed  . C. difficile diarrhea    taking Keflex and Bactrim daily  . Chronic diastolic CHF (congestive heart failure) (HCC) 09/2015  . Chronic pain syndrome    Fentanyl Patch  . Coronary artery disease    cath with normal LM, 30% LAD, 85% mid RCA and 95% distal RCA s/p PCI of the mid to distal RCA and now on DAPT with ASA and Ticagrelor.    . Diabetes mellitus    takes Metformin and Hum R daily  . Eczema   . Excessive daytime sleepiness 11/26/2015  . GERD (gastroesophageal reflux disease)   . Hyperlipidemia   . Hypertension    takes Metoprolol and Enalapril daily  . Hypothyroidism    takes Synthroid daily  . IBS (irritable bowel syndrome)   . Mixed connective tissue disease [facility])     Current Outpatient Prescriptions on File Prior to Visit  Medication Sig Dispense Refill  . albuterol (PROVENTIL HFA;VENTOLIN HFA) 108 (90 Base) MCG/ACT inhaler Inhale 2 puffs into the lungs every 4 (four) hours as needed for  wheezing or shortness of breath. 1 Inhaler 1  . albuterol (PROVENTIL) (2.5 MG/3ML) 0.083% nebulizer solution Inhale 2.5 mLs into the lungs every 4 (four) hours as needed for wheezing or shortness of breath. 75 mL 1  . aspirin EC 81 MG tablet Take 81 mg by mouth daily.    Marland Kitchen b complex vitamins tablet Take 1 tablet by mouth daily.    . beclomethasone (QVAR) 40 MCG/ACT inhaler ONE PUFF TWICE A DAY TO PREVENT COUGH OR WHEEZE. USE SPACER. Rinse, gargle and spit out after use 1 Inhaler 5  . betamethasone dipropionate (DIPROLENE) 0.05 % cream Apply 1 application topically daily as needed (eczema).    . BRILINTA 90 MG TABS tablet Take 1 tablet (90 mg total) by mouth 2 (two) times daily. 180 tablet 1  . cephALEXin (KEFLEX) 500 MG capsule Take 1 capsule (500 mg total) by mouth 2 (two) times daily. 14 capsule 0  . cetirizine (ZYRTEC) 10 MG tablet Take 10 mg by mouth daily as needed for allergies.    . clotrimazole-betamethasone (LOTRISONE) cream Apply 1 application topically daily as needed (eczema).    . diphenhydrAMINE (BENADRYL) 25 MG tablet Take 1 tablet (25 mg total) by  mouth every 6 (six) hours as needed for itching or allergies. 10 tablet 0  . enalapril (VASOTEC) 10 MG tablet Take 1 tablet (10 mg total) by mouth 2 (two) times daily. 180 tablet 3  . EPINEPHrine (EPIPEN 2-PAK) 0.3 mg/0.3 mL IJ SOAJ injection USE AS DIRECTED FOR SEVERE ALLERGIC REACTION. 2 Device 3  . Estrogens, Conjugated (PREMARIN VA) Place 1 application vaginally 3 (three) times a week. On Saturdays, Mondays, and Wednesday (cream)    . fentaNYL (DURAGESIC - DOSED MCG/HR) 75 MCG/HR Place 75 mcg onto the skin every other day.     . fluticasone (FLONASE) 50 MCG/ACT nasal spray Place 2 sprays into both nostrils daily as needed for allergies or rhinitis. 16 g 5  . furosemide (LASIX) 40 MG tablet Take 40 mg by mouth daily as needed for fluid.     Marland Kitchen gabapentin (NEURONTIN) 100 MG capsule Take 400-700 mg by mouth See admin instructions. Pt takes  400mg  in the morning, 400mg  at lunchtime and 700mg  in the evening    . GLUCOMANNAN PO Take 700 mg by mouth 2 (two) times daily.    . Guaifenesin (MUCINEX MAXIMUM STRENGTH) 1200 MG TB12 Take 1,200 mg by mouth 2 (two) times daily.    . hydroxychloroquine (PLAQUENIL) 200 MG tablet Take 200 mg by mouth 2 (two) times daily.    . insulin regular human CONCENTRATED (HUMULIN R) 500 UNIT/ML SOLN injection Inject 0-100 Units into the skin 4 (four) times daily -  before meals and at bedtime. TID w/ meals Cbgs <125 = no insulin          125-150 = 10 units using insulin syringe ( actual dose is 50 units)          151 - 200 = 15 units using insulin syringe ( actual dose is 75 units)          > 201 = 20 units using insulin syringe ( actual dose is 100 units)  QHS Cbgs <150 = no insulin          151-200= 5 units using insulin syringe ( actual dose is 25 units)          201 - 300 = 10 units using insulin syringe ( actual dose is 50 units)          > 300 = 15 units using insulin syringe ( actual dose is 75 units)    . Insulin Syringe-Needle U-100 (EASY TOUCH INSULIN SYRINGE) 30G X 5/16" 0.5 ML MISC     . levalbuterol (XOPENEX) 1.25 MG/0.5ML nebulizer solution Take 1 ampule by nebulization 4 (four) times daily as needed for wheezing or shortness of breath.    . levothyroxine (SYNTHROID, LEVOTHROID) 200 MCG tablet Take 200 mcg by mouth daily before breakfast.     . magnesium oxide (MAG-OX) 400 MG tablet Take 400 mg by mouth every evening.     . metFORMIN (GLUCOPHAGE-XR) 500 MG 24 hr tablet Take 1,000 mg by mouth 2 (two) times daily.  0  . metoprolol tartrate (LOPRESSOR) 25 MG tablet TAKE ONE TABLET BY MOUTH TWICE DAILY 180 tablet 2  . metroNIDAZOLE (FLAGYL) 500 MG tablet Take 1 tablet (500 mg total) by mouth 2 (two) times daily. Take 1 pill twice daily for one week. NO alcohol 14 tablet 0  . milk thistle 175 MG tablet Take 175 mg by mouth 2 (two) times daily.     . ondansetron (ZOFRAN) 4 MG tablet Take 1 tablet  (4 mg total) by mouth  every 8 (eight) hours as needed for nausea or vomiting. 20 tablet 0  . Oxycodone HCl 10 MG TABS Take 10 mg by mouth every 4 (four) hours as needed (for pain).    Marland Kitchen oxymetazoline (AFRIN) 0.05 % nasal spray Place 1 spray into both nostrils 2 (two) times daily as needed for congestion.    Bertram Gala Glycol-Propyl Glycol (SYSTANE ULTRA OP) Apply 1 drop to eye daily as needed (dry eyes).    . promethazine (PHENERGAN) 25 MG tablet Take 25 mg by mouth every 6 (six) hours as needed for nausea or vomiting.    . saccharomyces boulardii (FLORASTOR) 250 MG capsule Take 1 capsule (250 mg total) by mouth 2 (two) times daily. 30 capsule 0  . vitamin A 74081 UNIT capsule Take 10,000 Units by mouth daily.    . vitamin B-12 (CYANOCOBALAMIN) 1000 MCG tablet Take 1,000 mcg by mouth daily.      . Vitamin D, Ergocalciferol, (DRISDOL) 50000 units CAPS capsule Take 1 capsule (50,000 Units total) by mouth once a week. Monday. 30 capsule 3  . Vitamins-Lipotropics (LIPO-FLAVONOID PLUS PO) Take 1 tablet by mouth 3 (three) times daily.     No current facility-administered medications on file prior to visit.     Allergies  Allergen Reactions  . Atrovent Shortness Of Breath    Wheezing becomes worse  . Fish Allergy Anaphylaxis    INCLUDES OMEGA 3 OILS  . Fish Oil Anaphylaxis and Swelling    THROAT SWELLS INCLUDES FISH AS A CLASS  . Fish Oil [Omega-3 Fatty Acids] Swelling    Throat swelling   . Ipratropium Shortness Of Breath  . Other Anaphylaxis and Other (See Comments)    UNSPECIFIED REACTION  Perch  . Crestor [Rosuvastatin] Other (See Comments)    Extreme joint pain, to this & other statins  . Gabapentin Anxiety    Anxiety on high doses  . Lovastatin Other (See Comments)    EXTREME JOINT PAIN  . Monascus Purpureus Went Yeast Other (See Comments)    Muscle pain and severe joint pain.   . Red Yeast Rice [Cholestin] Other (See Comments)    Muscle pain and severe joint pain.   .  Shellfish Allergy Other (See Comments)    UNSPECIFIED REACTION   . Penicillins Rash and Other (See Comments)    UNSPECIFIED, TOLERATES KEFLEX  CHILDHOOD RX PCN CHALLENGE PER ALLERGIST, 02/29/16.  RESULTS NOT UPDATED.    Rolene Arbour [Na Sulfate-K Sulfate-Mg Sulf] Nausea And Vomiting  . Tape Other (See Comments)    Electrodes causes skin breakdown    Assessment/Plan:

## 2017-01-04 ENCOUNTER — Encounter (HOSPITAL_BASED_OUTPATIENT_CLINIC_OR_DEPARTMENT_OTHER): Payer: Self-pay | Admitting: *Deleted

## 2017-01-04 ENCOUNTER — Emergency Department (HOSPITAL_BASED_OUTPATIENT_CLINIC_OR_DEPARTMENT_OTHER)
Admission: EM | Admit: 2017-01-04 | Discharge: 2017-01-04 | Disposition: A | Discharge status: Home / Self Care | Payer: BLUE CROSS/BLUE SHIELD | Attending: Emergency Medicine | Admitting: Emergency Medicine

## 2017-01-04 DIAGNOSIS — Y929 Unspecified place or not applicable: Secondary | ICD-10-CM | POA: Insufficient documentation

## 2017-01-04 DIAGNOSIS — T2004XA Burn of unspecified degree of nose (septum), initial encounter: Secondary | ICD-10-CM | POA: Diagnosis present

## 2017-01-04 DIAGNOSIS — T2027XA Burn of second degree of neck, initial encounter: Secondary | ICD-10-CM | POA: Diagnosis not present

## 2017-01-04 DIAGNOSIS — T2121XA Burn of second degree of chest wall, initial encounter: Secondary | ICD-10-CM | POA: Diagnosis not present

## 2017-01-04 DIAGNOSIS — J45909 Unspecified asthma, uncomplicated: Secondary | ICD-10-CM | POA: Diagnosis not present

## 2017-01-04 DIAGNOSIS — I509 Heart failure, unspecified: Secondary | ICD-10-CM | POA: Insufficient documentation

## 2017-01-04 DIAGNOSIS — Y999 Unspecified external cause status: Secondary | ICD-10-CM | POA: Insufficient documentation

## 2017-01-04 DIAGNOSIS — E039 Hypothyroidism, unspecified: Secondary | ICD-10-CM | POA: Insufficient documentation

## 2017-01-04 DIAGNOSIS — Z794 Long term (current) use of insulin: Secondary | ICD-10-CM | POA: Insufficient documentation

## 2017-01-04 DIAGNOSIS — E119 Type 2 diabetes mellitus without complications: Secondary | ICD-10-CM | POA: Diagnosis not present

## 2017-01-04 DIAGNOSIS — T2024XA Burn of second degree of nose (septum), initial encounter: Secondary | ICD-10-CM | POA: Diagnosis not present

## 2017-01-04 DIAGNOSIS — I2510 Atherosclerotic heart disease of native coronary artery without angina pectoris: Secondary | ICD-10-CM | POA: Insufficient documentation

## 2017-01-04 DIAGNOSIS — Z87891 Personal history of nicotine dependence: Secondary | ICD-10-CM | POA: Diagnosis not present

## 2017-01-04 DIAGNOSIS — Y278XXA Contact with other hot objects, undetermined intent, initial encounter: Secondary | ICD-10-CM | POA: Diagnosis not present

## 2017-01-04 DIAGNOSIS — Y93G3 Activity, cooking and baking: Secondary | ICD-10-CM | POA: Insufficient documentation

## 2017-01-04 DIAGNOSIS — T300 Burn of unspecified body region, unspecified degree: Secondary | ICD-10-CM

## 2017-01-04 DIAGNOSIS — Z79899 Other long term (current) drug therapy: Secondary | ICD-10-CM | POA: Diagnosis not present

## 2017-01-04 DIAGNOSIS — Z7982 Long term (current) use of aspirin: Secondary | ICD-10-CM | POA: Insufficient documentation

## 2017-01-04 DIAGNOSIS — T23271A Burn of second degree of right wrist, initial encounter: Secondary | ICD-10-CM | POA: Diagnosis not present

## 2017-01-04 DIAGNOSIS — I110 Hypertensive heart disease with heart failure: Secondary | ICD-10-CM | POA: Insufficient documentation

## 2017-01-04 MED ORDER — SILVER SULFADIAZINE 1 % EX CREA: 1.0000 "application " | TOPICAL_CREAM | Freq: Every day | CUTANEOUS | 2 refills | Status: DC

## 2017-01-04 MED ORDER — DOXYCYCLINE HYCLATE 100 MG PO CAPS: 100.0000 mg | ORAL_CAPSULE | Freq: Two times a day (BID) | ORAL | 0 refills | Status: DC

## 2017-01-04 NOTE — Discharge Instructions (Signed)
General instructions  To prevent infection: Do not put butter, oil, or other home remedies on the burn. Do not scratch or pick at the burn. Do not break any blisters. Do not peel skin. Do not rub your burn, even when you are cleaning it. Protect your burn from the sun. Keep all follow-up visits as told by your health care provider. This is important. Contact a health care provider if: Your condition does not improve. Your condition gets worse. You have a fever. Your burn changes in appearance or develops black or red spots. Your burn feels warm to the touch. Your pain is not controlled with medicine. Get help right away if: You have redness, swelling, or pain at the site of the burn. You have fluid, blood, or pus coming from your burn. You have red streaks near the burn. You have severe pain.
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
## 2017-01-31 ENCOUNTER — Telehealth: Payer: Self-pay | Admitting: Pediatrics

## 2017-01-31 NOTE — Telephone Encounter (Signed)
Please call patient back about a previous payment that she made. She thought she paid on her access card and has a question about this. Thanks

## 2017-01-31 NOTE — Telephone Encounter (Signed)
Pt wants her balance sent to Access 1 - kt

## 2017-02-01 ENCOUNTER — Telehealth: Payer: Self-pay | Admitting: Family Medicine

## 2017-02-01 NOTE — Telephone Encounter (Signed)
Fine with me

## 2017-02-01 NOTE — Telephone Encounter (Signed)
Caller name:Terriona Bartelt Relationship to patient: Can be [phone] Pharmacy:  Reason for call:Requesting to switch MDs from Copland to [place]. Please advise

## 2017-02-05 NOTE — Telephone Encounter (Signed)
Ok w/ me 

## 2017-02-08 NOTE — Telephone Encounter (Signed)
Called patient, voice mail is full.

## 2017-03-07 ENCOUNTER — Ambulatory Visit: Payer: BLUE CROSS/BLUE SHIELD | Admitting: Family Medicine

## 2017-03-28 ENCOUNTER — Other Ambulatory Visit: Payer: Self-pay | Admitting: Cardiology

## 2017-04-04 ENCOUNTER — Encounter: Payer: Self-pay | Admitting: Family Medicine

## 2017-04-04 ENCOUNTER — Ambulatory Visit (INDEPENDENT_AMBULATORY_CARE_PROVIDER_SITE_OTHER): Payer: BLUE CROSS/BLUE SHIELD | Admitting: Family Medicine

## 2017-04-04 ENCOUNTER — Encounter: Payer: Self-pay | Admitting: Gastroenterology

## 2017-04-04 VITALS — BP 128/70 | HR 80 | Temp 98.0°F | Ht 62.0 in | Wt 275.4 lb

## 2017-04-04 DIAGNOSIS — K047 Periapical abscess without sinus: Secondary | ICD-10-CM | POA: Diagnosis not present

## 2017-04-04 DIAGNOSIS — K625 Hemorrhage of anus and rectum: Secondary | ICD-10-CM | POA: Diagnosis not present

## 2017-04-04 LAB — CBC
HCT: 44.6 % (ref 36.0–46.0)
Hemoglobin: 14.1 g/dL (ref 12.0–15.0)
MCHC: 31.7 g/dL (ref 30.0–36.0)
MCV: 88 fl (ref 78.0–100.0)
Platelets: 244 10*3/uL (ref 150.0–400.0)
RBC: 5.06 Mil/uL (ref 3.87–5.11)
RDW: 14.7 % (ref 11.5–15.5)
WBC: 11.2 10*3/uL — ABNORMAL HIGH (ref 4.0–10.5)

## 2017-04-04 MED ORDER — CEPHALEXIN 500 MG PO CAPS: 500.0000 mg | ORAL_CAPSULE | Freq: Two times a day (BID) | ORAL | 0 refills | Status: DC

## 2017-04-04 NOTE — Progress Notes (Signed)
Chief Complaint  Patient presents with  . Transitions Of Care    pt having jaw pain (R) side radiating down the neck-since Sun    Subjective: Patient is a 56 y.o. female here for Transition of care.  Dental pain since 4 days ago. Has had issues with it in past, knows she needs to see dentist. No drainage or strange tastes. She states she did have a fever of 101F yesterday. She does not have one today. She has a history of mixed connective tissue disease that is causing her to lose teeth. She states that she does breast her teeth on a daily basis.  3-4 years ago, started having brbpr. She has a hx of IBS, diarrhea predominate. She does not have any issues with burning or itching outside her bottom area. She does not have a known history of hemorrhoids. No injury. She did have a stent placed within the past year and is currently on Brilinta and aspirin. She had a normal colonoscopy 5 years ago, prior to the start of this bleeding issue. She is not losing weight, having significant/new pain, and is not having nighttime awakenings.  ROS: Const: No current fever GI: As noted in HPI  Family History  Problem Relation Age of Onset  . CAD Mother   . Hypertension Mother   . Heart attack Mother   . CAD Father   . Heart attack Father   . Allergic rhinitis Father   . Asthma Father   . Hypertension Brother   . Hypertension Brother   . Pancreatic cancer Paternal Aunt   . Breast cancer Paternal Aunt    Past Medical History:  Diagnosis Date  . Asthma    QVAR daily and Albuterol as needed  . C. difficile diarrhea    taking Keflex and Bactrim daily  . Chronic diastolic CHF (congestive heart failure) (HCC) 09/2015  . Chronic pain syndrome    Fentanyl Patch  . Coronary artery disease    cath with normal LM, 30% LAD, 85% mid RCA and 95% distal RCA s/p PCI of the mid to distal RCA and now on DAPT with ASA and Ticagrelor.    . Diabetes mellitus    takes Metformin and Hum R daily  . Eczema   .  Excessive daytime sleepiness 11/26/2015  . GERD (gastroesophageal reflux disease)   . Hyperlipidemia   . Hypertension    takes Metoprolol and Enalapril daily  . Hypothyroidism    takes Synthroid daily  . IBS (irritable bowel syndrome)   . Mixed connective tissue disease (HCC)    Allergies  Allergen Reactions  . Atrovent Shortness Of Breath    Wheezing becomes worse  . Fish Allergy Anaphylaxis    INCLUDES OMEGA 3 OILS  . Fish Oil Anaphylaxis and Swelling    THROAT SWELLS INCLUDES FISH AS A CLASS  . Fish Oil [Omega-3 Fatty Acids] Swelling    Throat swelling   . Ipratropium Shortness Of Breath  . Other Anaphylaxis and Other (See Comments)    UNSPECIFIED REACTION  Perch  . Crestor [Rosuvastatin] Other (See Comments)    Extreme joint pain, to this & other statins  . Gabapentin Anxiety    Anxiety on high doses  . Lovastatin Other (See Comments)    EXTREME JOINT PAIN  . Monascus Purpureus Went Yeast Other (See Comments)    Muscle pain and severe joint pain.   . Red Yeast Rice [Cholestin] Other (See Comments)    Muscle pain and severe joint pain.   Marland Kitchen  Metronidazole Nausea Only and Swelling  . Shellfish Allergy Other (See Comments)    UNSPECIFIED REACTION   . Penicillins Rash and Other (See Comments)    UNSPECIFIED, TOLERATES KEFLEX  CHILDHOOD RX PCN CHALLENGE PER ALLERGIST, 02/29/16.  RESULTS NOT UPDATED.    Rolene Arbour [Na Sulfate-K Sulfate-Mg Sulf] Nausea And Vomiting  . Tape Other (See Comments)    Electrodes causes skin breakdown    Current Outpatient Prescriptions:  .  albuterol (PROVENTIL HFA;VENTOLIN HFA) 108 (90 Base) MCG/ACT inhaler, Inhale 2 puffs into the lungs every 4 (four) hours as needed for wheezing or shortness of breath., Disp: 1 Inhaler, Rfl: 1 .  albuterol (PROVENTIL) (2.5 MG/3ML) 0.083% nebulizer solution, Inhale 2.5 mLs into the lungs every 4 (four) hours as needed for wheezing or shortness of breath., Disp: 75 mL, Rfl: 1 .  aspirin EC 81 MG tablet, Take 81  mg by mouth daily., Disp: , Rfl:  .  b complex vitamins tablet, Take 1 tablet by mouth daily., Disp: , Rfl:  .  beclomethasone (QVAR) 40 MCG/ACT inhaler, ONE PUFF TWICE A DAY TO PREVENT COUGH OR WHEEZE. USE SPACER. Rinse, gargle and spit out after use, Disp: 1 Inhaler, Rfl: 5 .  betamethasone dipropionate (DIPROLENE) 0.05 % cream, Apply 1 application topically daily as needed (eczema)., Disp: , Rfl:  .  BRILINTA 90 MG TABS tablet, TAKE ONE TABLET BY MOUTH TWICE DAILY, Disp: 180 tablet, Rfl: 4 .  cetirizine (ZYRTEC) 10 MG tablet, Take 10 mg by mouth daily as needed for allergies., Disp: , Rfl:  .  clotrimazole-betamethasone (LOTRISONE) cream, Apply 1 application topically daily as needed (eczema)., Disp: , Rfl:  .  diphenhydrAMINE (BENADRYL) 25 MG tablet, Take 1 tablet (25 mg total) by mouth every 6 (six) hours as needed for itching or allergies., Disp: 10 tablet, Rfl: 0 .  enalapril (VASOTEC) 10 MG tablet, Take 1 tablet (10 mg total) by mouth 2 (two) times daily., Disp: 180 tablet, Rfl: 3 .  EPINEPHrine (EPIPEN 2-PAK) 0.3 mg/0.3 mL IJ SOAJ injection, USE AS DIRECTED FOR SEVERE ALLERGIC REACTION., Disp: 2 Device, Rfl: 3 .  Estrogens, Conjugated (PREMARIN VA), Place 1 application vaginally 3 (three) times a week. On Saturdays, Mondays, and Wednesday (cream), Disp: , Rfl:  .  FentaNYL 37.5 MCG/HR PT72, Place 37.5mg  onto the skin every 72 hours., Disp: , Rfl: 0 .  fluticasone (FLONASE) 50 MCG/ACT nasal spray, Place 2 sprays into both nostrils daily as needed for allergies or rhinitis., Disp: 16 g, Rfl: 5 .  furosemide (LASIX) 40 MG tablet, Take 40 mg by mouth daily as needed for fluid. , Disp: , Rfl:  .  gabapentin (NEURONTIN) 100 MG capsule, Take 400-700 mg by mouth See admin instructions. Pt takes 400mg  in the morning, 400mg  at lunchtime and 700mg  in the evening, Disp: , Rfl:  .  GLUCOMANNAN PO, Take 700 mg by mouth 2 (two) times daily., Disp: , Rfl:  .  glucose blood (BAYER CONTOUR TEST) test strip,  CHECK BLOOD SUGAR 4 TIMES DAILY, Disp: , Rfl:  .  Guaifenesin (MUCINEX MAXIMUM STRENGTH) 1200 MG TB12, Take 1,200 mg by mouth 2 (two) times daily., Disp: , Rfl:  .  hydroxychloroquine (PLAQUENIL) 200 MG tablet, Take 200 mg by mouth 2 (two) times daily., Disp: , Rfl:  .  insulin regular human CONCENTRATED (HUMULIN R) 500 UNIT/ML SOLN injection, Inject 0-100 Units into the skin 4 (four) times daily -  before meals and at bedtime. TID w/ meals Cbgs <125 = no  insulin          125-150 = 10 units using insulin syringe ( actual dose is 50 units)          151 - 200 = 15 units using insulin syringe ( actual dose is 75 units)          > 201 = 20 units using insulin syringe ( actual dose is 100 units)  QHS Cbgs <150 = no insulin          151-200= 5 units using insulin syringe ( actual dose is 25 units)          201 - 300 = 10 units using insulin syringe ( actual dose is 50 units)          > 300 = 15 units using insulin syringe ( actual dose is 75 units), Disp: , Rfl:  .  Insulin Syringe-Needle U-100 (EASY TOUCH INSULIN SYRINGE) 30G X 5/16" 0.5 ML MISC, , Disp: , Rfl:  .  levalbuterol (XOPENEX) 1.25 MG/0.5ML nebulizer solution, Take 1 ampule by nebulization 4 (four) times daily as needed for wheezing or shortness of breath., Disp: , Rfl:  .  levothyroxine (SYNTHROID, LEVOTHROID) 200 MCG tablet, Take 200 mcg by mouth daily before breakfast. , Disp: , Rfl:  .  magnesium oxide (MAG-OX) 400 MG tablet, Take 400 mg by mouth every evening. , Disp: , Rfl:  .  metFORMIN (GLUCOPHAGE-XR) 500 MG 24 hr tablet, Take 1,000 mg by mouth 2 (two) times daily., Disp: , Rfl: 0 .  metoprolol tartrate (LOPRESSOR) 25 MG tablet, TAKE ONE TABLET BY MOUTH TWICE DAILY, Disp: 180 tablet, Rfl: 2 .  milk thistle 175 MG tablet, Take 175 mg by mouth 2 (two) times daily. , Disp: , Rfl:  .  ondansetron (ZOFRAN) 4 MG tablet, Take 1 tablet (4 mg total) by mouth every 8 (eight) hours as needed for nausea or vomiting., Disp: 20 tablet, Rfl: 0 .   Oxycodone HCl 10 MG TABS, Take 10 mg by mouth every 4 (four) hours as needed (for pain)., Disp: , Rfl:  .  oxymetazoline (AFRIN) 0.05 % nasal spray, Place 1 spray into both nostrils 2 (two) times daily as needed for congestion., Disp: , Rfl:  .  Polyethyl Glycol-Propyl Glycol (SYSTANE ULTRA OP), Apply 1 drop to eye daily as needed (dry eyes)., Disp: , Rfl:  .  promethazine (PHENERGAN) 25 MG tablet, Take 25 mg by mouth every 6 (six) hours as needed for nausea or vomiting., Disp: , Rfl:  .  saccharomyces boulardii (FLORASTOR) 250 MG capsule, Take 1 capsule (250 mg total) by mouth 2 (two) times daily., Disp: 30 capsule, Rfl: 0 .  silver sulfADIAZINE (SILVADENE) 1 % cream, Apply 1 application topically daily., Disp: 50 g, Rfl: 2 .  vitamin A 34193 UNIT capsule, Take 10,000 Units by mouth daily., Disp: , Rfl:  .  vitamin B-12 (CYANOCOBALAMIN) 1000 MCG tablet, Take 1,000 mcg by mouth daily.  , Disp: , Rfl:  .  Vitamin D, Ergocalciferol, (DRISDOL) 50000 units CAPS capsule, Take 1 capsule (50,000 Units total) by mouth once a week. Monday., Disp: 30 capsule, Rfl: 3 .  Vitamins-Lipotropics (LIPO-FLAVONOID PLUS PO), Take 1 tablet by mouth 3 (three) times daily., Disp: , Rfl:  .  cephALEXin (KEFLEX) 500 MG capsule, Take 1 capsule (500 mg total) by mouth 2 (two) times daily., Disp: 14 capsule, Rfl: 0  Objective: BP 128/70 (BP Location: Left Arm, Patient Position: Sitting, Cuff Size: Normal)   Pulse 80  Temp 98 F (36.7 C) (Oral)   Ht 5\' 2"  (1.575 m)   Wt 275 lb 6.4 oz (124.9 kg)   SpO2 98%   BMI 50.37 kg/m  General: Awake, appears stated age HEENT: MMM, decaying mand molar on the R, no gingival edema or drainage, numerous missing teeth; EOMi Heart: RRR, 2/6 SEM heard loudest at the aortic listening post Lungs: CTAB, no rales, wheezes or rhonchi. No accessory muscle use Abd: Large panniculus; BS+, soft, TTP in upper quadrants; she declined rectal exam today Psych: Age appropriate judgment and insight,  normal affect and mood  Assessment and Plan: Dental infection - Plan: cephALEXin (KEFLEX) 500 MG capsule  BRBPR (bright red blood per rectum) - Plan: Ambulatory referral to Gastroenterology, CBC  Orders as above. Pt states that she has taken Keflex in the past and has tolerated it well. Encouraged her to contact her dentist/oral surgeon. Will refer to GI as bleeding started after colonoscopy. Will check CBC to make sure she is not severely anemic.  F/u pending above.  The patient voiced understanding and agreement to the plan.  Jilda Roche McSwain, DO 04/04/17  12:02 PM

## 2017-04-04 NOTE — Patient Instructions (Addendum)
If you do not hear anything about your referral in the next 1-2 weeks, call our office and ask for an update.  Give [place] 2-3 business days to get the results of your labs back.  Call your oral surgeon sooner than later.

## 2017-04-17 ENCOUNTER — Ambulatory Visit: Payer: BLUE CROSS/BLUE SHIELD | Admitting: Gastroenterology

## 2017-04-24 ENCOUNTER — Other Ambulatory Visit: Payer: Self-pay | Admitting: Pediatrics

## 2017-04-30 ENCOUNTER — Ambulatory Visit: Payer: BLUE CROSS/BLUE SHIELD | Admitting: Pediatrics

## 2017-05-05 IMAGING — NM NM MISC PROCEDURE
6 series · 36 of 36 positions shown · non-contrast
Comparison: none

[Series 1: wbr_s-proj_st stress-sum-em · 6.40mm/px · 6 of 64 frames shown]
[frame 6/64]
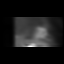
[frame 16/64]
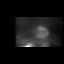
[frame 27/64]
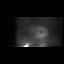
[frame 38/64]
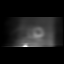
[frame 48/64]
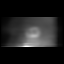
[frame 59/64]
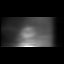

[Series 1: stress-gsp · 6.40mm/px · 6 of 502 frames shown]
[frame 42/502  full-range]
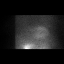
[frame 126/502  full-range]
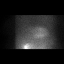
[frame 210/502  full-range]
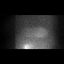
[frame 293/502  full-range]
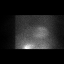
[frame 377/502  full-range]
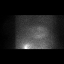
[frame 461/502  full-range]
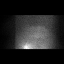

[Series 1: wbr_r-proj_st rest · 6.40mm/px · 6 of 64 frames shown]
[frame 6/64]
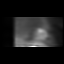
[frame 16/64]
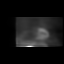
[frame 27/64]
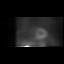
[frame 38/64]
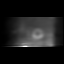
[frame 48/64]
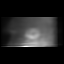
[frame 59/64]
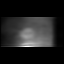

[Series 1: wbr_s-proj_st stress-gsp · 6.40mm/px · 6 of 512 frames shown]
[frame 43/512]
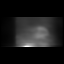
[frame 128/512]
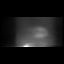
[frame 214/512]
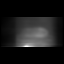
[frame 299/512]
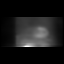
[frame 384/512]
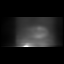
[frame 470/512]
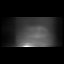

[Series 1: rest · 6.40mm/px · 6 of 64 frames shown]
[frame 6/64]
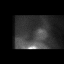
[frame 16/64]
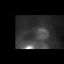
[frame 27/64]
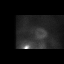
[frame 38/64]
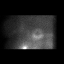
[frame 48/64]
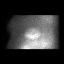
[frame 59/64]
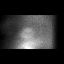

[Series 1: stress-sum-em · 6.40mm/px · 6 of 64 frames shown]
[frame 6/64]
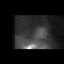
[frame 16/64]
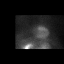
[frame 27/64]
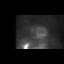
[frame 38/64]
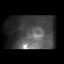
[frame 48/64]
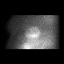
[frame 59/64]
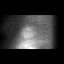

[36 of 36 positions shown; findings below may reference images not displayed]

Canned report from images found in remote index.

Refer to host system for actual result text.

## 2017-05-08 ENCOUNTER — Ambulatory Visit (INDEPENDENT_AMBULATORY_CARE_PROVIDER_SITE_OTHER): Payer: BLUE CROSS/BLUE SHIELD | Admitting: Gastroenterology

## 2017-05-08 ENCOUNTER — Other Ambulatory Visit: Payer: BLUE CROSS/BLUE SHIELD

## 2017-05-08 ENCOUNTER — Encounter: Payer: Self-pay | Admitting: Gastroenterology

## 2017-05-08 VITALS — BP 140/80 | HR 84 | Ht 61.61 in | Wt 278.2 lb

## 2017-05-08 DIAGNOSIS — K625 Hemorrhage of anus and rectum: Secondary | ICD-10-CM | POA: Insufficient documentation

## 2017-05-08 DIAGNOSIS — K529 Noninfective gastroenteritis and colitis, unspecified: Secondary | ICD-10-CM | POA: Diagnosis not present

## 2017-05-08 DIAGNOSIS — Z8619 Personal history of other infectious and parasitic diseases: Secondary | ICD-10-CM | POA: Diagnosis not present

## 2017-05-08 NOTE — Progress Notes (Addendum)
05/08/2017 [name] [id number] 09/01/1961   HISTORY OF PRESENT ILLNESS:  This is a 56 year old female who is new to our practice.  She has been referred here by Dr. Carmelia [name] for evaluation of diarrhea and rectal bleeding.  She tells me that she has had constant diarrhea for most of her life. She says that she is given a diagnosis of IBS back when she was very young. She says that she always has diarrhea. She has been diagnosed with C. difficile twice in the past 2 years and was treated with vancomycin. The last episode was an August 2017. She says that currently she is having anywhere from 5-9 bowel movements a day with urgency and nocturnal stools. She tells me that she had a negative C. difficile stool study a few months ago. She also reports seeing blood in her stool for the past 5 years or more. She says that for the past 3 year she's been having some blood with every bowel movement. She had a colonoscopy about 4 years ago and reports having some polyps, but they were benign and was recommended she have her repeat in 10 years. We are trying to obtain those records.  Of note, she is on Metformin 1000 mg BID and magnesium oxide 400 mg daily (for leg cramps).  She is on Brilinta for history of CAD with stents.   Past Medical History:  Diagnosis Date  . Asthma    QVAR daily and Albuterol as needed  . C. difficile diarrhea    taking Keflex and Bactrim daily  . Chronic diastolic CHF (congestive heart failure) (HCC) 09/2015  . Chronic pain syndrome    Fentanyl Patch  . Colon polyps   . Coronary artery disease    cath with normal LM, 30% LAD, 85% mid RCA and 95% distal RCA s/p PCI of the mid to distal RCA and now on DAPT with ASA and Ticagrelor.    . Diabetes mellitus    takes Metformin and Hum R daily  . Eczema   . Excessive daytime sleepiness 11/26/2015  . Fibromyalgia   . Gallstones   . Gastric ulcer   . GERD (gastroesophageal reflux disease)   . Hyperlipidemia   .  Hypertension    takes Metoprolol and Enalapril daily  . Hypothyroidism    takes Synthroid daily  . IBS (irritable bowel syndrome)   . Mixed connective tissue disease (HCC)   . Pneumonia   . Sepsis [name])    Past Surgical History:  Procedure Laterality Date  . ABDOMINAL HYSTERECTOMY    . APPENDECTOMY    . BREAST BIOPSY Bilateral    9 total  . CARDIAC CATHETERIZATION    . cardiac stents N/A 05/23/2016   pt notified department upon arrival for exam .....Marland Kitchen2 stents  . CHOLECYSTECTOMY    . CORONARY ANGIOPLASTY     normal LM, 30% LAD, 85% mid RCA and 95% distal RCA s/p PCI of the mid to distal RCA and now on DAPT with ASA and Ticagrelor.    . LEG SURGERY Left    muscle biospy  . RADIOLOGY WITH ANESTHESIA N/A 05/25/2016   Procedure: RADIOLOGY WITH ANESTHESIA;  Surgeon: Medication Radiologist, MD;  Location: [facility] OR;  Service: Radiology;  Laterality: N/A;  . TONSILLECTOMY AND ADENOIDECTOMY    . WRIST SURGERY Left     reports that she quit smoking about 13 years ago. Her smoking use included Cigarettes. She has a 20.00 pack-year smoking history. She has never used smokeless  tobacco. She reports that she drinks alcohol. She reports that she does not use drugs. family history includes Allergic rhinitis in her father; Asthma in her father; Breast cancer in her paternal aunt; CAD in her father and mother; Heart attack in her father and mother; Hypertension in her brother, brother, and mother; Lung cancer in her paternal aunt; Pancreatic cancer in her paternal aunt. Allergies  Allergen Reactions  . Atrovent Shortness Of Breath    Wheezing becomes worse  . Fish Allergy Anaphylaxis    INCLUDES OMEGA 3 OILS  . Fish Oil Anaphylaxis and Swelling    THROAT SWELLS INCLUDES FISH AS A CLASS  . Fish Oil [Omega-3 Fatty Acids] Swelling    Throat swelling   . Ipratropium Shortness Of Breath  . Other Anaphylaxis and Other (See Comments)    UNSPECIFIED REACTION  Perch  . Crestor [Rosuvastatin] Other (See  Comments)    Extreme joint pain, to this & other statins  . Gabapentin Anxiety    Anxiety on high doses  . Lovastatin Other (See Comments)    EXTREME JOINT PAIN  . Monascus Purpureus Went Yeast Other (See Comments)    Muscle pain and severe joint pain.   . Red Yeast Rice [Cholestin] Other (See Comments)    Muscle pain and severe joint pain.   . Metronidazole Nausea Only and Swelling  . Shellfish Allergy Other (See Comments)    UNSPECIFIED REACTION   . Penicillins Rash and Other (See Comments)    UNSPECIFIED, TOLERATES KEFLEX  CHILDHOOD RX PCN CHALLENGE PER ALLERGIST, 02/29/16.  RESULTS NOT UPDATED.    Rolene Arbour [Na Sulfate-K Sulfate-Mg Sulf] Nausea And Vomiting  . Tape Other (See Comments)    Electrodes causes skin breakdown      Outpatient Encounter Prescriptions as of 05/08/2017  Medication Sig  . albuterol (PROVENTIL HFA;VENTOLIN HFA) 108 (90 Base) MCG/ACT inhaler Inhale 2 puffs into the lungs every 4 (four) hours as needed for wheezing or shortness of breath.  Marland Kitchen albuterol (PROVENTIL) (2.5 MG/3ML) 0.083% nebulizer solution Inhale 2.5 mLs into the lungs every 4 (four) hours as needed for wheezing or shortness of breath.  Marland Kitchen aspirin EC 81 MG tablet Take 81 mg by mouth daily.  Marland Kitchen b complex vitamins tablet Take 1 tablet by mouth daily.  . beclomethasone (QVAR) 40 MCG/ACT inhaler ONE PUFF TWICE A DAY TO PREVENT COUGH OR WHEEZE. USE SPACER. Rinse, gargle and spit out after use  . betamethasone dipropionate (DIPROLENE) 0.05 % cream Apply 1 application topically daily as needed (eczema).  . BRILINTA 90 MG TABS tablet TAKE ONE TABLET BY MOUTH TWICE DAILY  . cetirizine (ZYRTEC) 10 MG tablet Take 10 mg by mouth daily as needed for allergies.  . clotrimazole-betamethasone (LOTRISONE) cream Apply 1 application topically daily as needed (eczema).  . diphenhydrAMINE (BENADRYL) 25 MG tablet Take 1 tablet (25 mg total) by mouth every 6 (six) hours as needed for itching or allergies.  Marland Kitchen enalapril  (VASOTEC) 10 MG tablet Take 1 tablet (10 mg total) by mouth 2 (two) times daily.  . Estrogens, Conjugated (PREMARIN VA) Place 1 application vaginally 3 (three) times a week. On Saturdays, Mondays, and Wednesday (cream)  . fentaNYL (DURAGESIC - DOSED MCG/HR) 25 MCG/HR patch Place 1 patch onto the skin every 3 (three) days.  . fluticasone (FLONASE) 50 MCG/ACT nasal spray USE TWO SPRAY(S) IN EACH NOSTRIL ONCE DAILY AS NEEDED FOR ALLERGY OR RHINITIS  . furosemide (LASIX) 40 MG tablet Take 40 mg by mouth daily as needed  for fluid.   Marland Kitchen gabapentin (NEURONTIN) 100 MG capsule Take 400-700 mg by mouth See admin instructions. Pt takes 400mg  in the morning, 400mg  at lunchtime and 700mg  in the evening  . GLUCOMANNAN PO Take 700 mg by mouth 2 (two) times daily.  Marland Kitchen glucose blood (BAYER CONTOUR TEST) test strip CHECK BLOOD SUGAR 4 TIMES DAILY  . Guaifenesin (MUCINEX MAXIMUM STRENGTH) 1200 MG TB12 Take 1,200 mg by mouth 2 (two) times daily.  . hydroxychloroquine (PLAQUENIL) 200 MG tablet Take 200 mg by mouth 2 (two) times daily.  . insulin regular human CONCENTRATED (HUMULIN R) 500 UNIT/ML SOLN injection Inject 0-100 Units into the skin 4 (four) times daily -  before meals and at bedtime. TID w/ meals Cbgs <125 = no insulin          125-150 = 10 units using insulin syringe ( actual dose is 50 units)          151 - 200 = 15 units using insulin syringe ( actual dose is 75 units)          > 201 = 20 units using insulin syringe ( actual dose is 100 units)  QHS Cbgs <150 = no insulin          151-200= 5 units using insulin syringe ( actual dose is 25 units)          201 - 300 = 10 units using insulin syringe ( actual dose is 50 units)          > 300 = 15 units using insulin syringe ( actual dose is 75 units)  . levalbuterol (XOPENEX) 1.25 MG/0.5ML nebulizer solution Take 1 ampule by nebulization 4 (four) times daily as needed for wheezing or shortness of breath.  . levothyroxine (SYNTHROID, LEVOTHROID) 200 MCG  tablet Take 200 mcg by mouth daily before breakfast.   . magnesium oxide (MAG-OX) 400 MG tablet Take 400 mg by mouth every evening.   . metFORMIN (GLUCOPHAGE-XR) 500 MG 24 hr tablet Take 1,000 mg by mouth 2 (two) times daily.  . metoprolol tartrate (LOPRESSOR) 25 MG tablet TAKE ONE TABLET BY MOUTH TWICE DAILY  . milk thistle 175 MG tablet Take 175 mg by mouth 2 (two) times daily.   . ondansetron (ZOFRAN) 4 MG tablet Take 1 tablet (4 mg total) by mouth every 8 (eight) hours as needed for nausea or vomiting.  Marland Kitchen oxyCODONE (ROXICODONE) 15 MG immediate release tablet Take 1 tablet by mouth every 4 (four) hours as needed.  . Oxycodone HCl 10 MG TABS Take 10 mg by mouth every 4 (four) hours as needed (for pain).  Marland Kitchen oxymetazoline (AFRIN) 0.05 % nasal spray Place 1 spray into both nostrils 2 (two) times daily as needed for congestion.  Bertram Gala Glycol-Propyl Glycol (SYSTANE ULTRA OP) Apply 1 drop to eye daily as needed (dry eyes).  . promethazine (PHENERGAN) 25 MG tablet Take 25 mg by mouth every 6 (six) hours as needed for nausea or vomiting.  . saccharomyces boulardii (FLORASTOR) 250 MG capsule Take 1 capsule (250 mg total) by mouth 2 (two) times daily.  . silver sulfADIAZINE (SILVADENE) 1 % cream Apply 1 application topically daily.  . vitamin A 82993 UNIT capsule Take 10,000 Units by mouth daily.  . vitamin B-12 (CYANOCOBALAMIN) 1000 MCG tablet Take 1,000 mcg by mouth daily.    . Vitamin D, Ergocalciferol, (DRISDOL) 50000 units CAPS capsule Take 1 capsule (50,000 Units total) by mouth once a week. Monday.  . Vitamins-Lipotropics (LIPO-FLAVONOID PLUS  PO) Take 1 tablet by mouth 3 (three) times daily.  Marland Kitchen EPINEPHrine (EPIPEN 2-PAK) 0.3 mg/0.3 mL IJ SOAJ injection USE AS DIRECTED FOR SEVERE ALLERGIC REACTION. (Patient not taking: Reported on 05/08/2017)  . [DISCONTINUED] cephALEXin (KEFLEX) 500 MG capsule Take 1 capsule (500 mg total) by mouth 2 (two) times daily.  . [DISCONTINUED] FentaNYL 37.5 MCG/HR  PT72 Place 37.5mg  onto the skin every 72 hours.  . [DISCONTINUED] Insulin Syringe-Needle U-100 (EASY TOUCH INSULIN SYRINGE) 30G X 5/16" 0.5 ML MISC    No facility-administered encounter medications on file as of 05/08/2017.      REVIEW OF SYSTEMS  : All other systems reviewed and negative except where noted in the History of Present Illness.   PHYSICAL EXAM: BP 140/80 (BP Location: Left Arm, Patient Position: Sitting, Cuff Size: Normal)   Pulse 84   Ht 5' 1.61" (1.565 m) Comment: height measured without shoes  Wt 278 lb 4 oz (126.2 kg)   BMI 51.53 kg/m  General: Well developed white female in no acute distress Head: Normocephalic and atraumatic Eyes:  Sclerae anicteric, conjunctiva pink. Ears: Normal auditory acuity Lungs: Clear throughout to auscultation; no increased WOB. Heart: Regular rate and rhythm; no M/R/G. Abdomen: Soft, non-distended.  BS present.  Non-tender. Musculoskeletal: Symmetrical with no gross deformities  Skin: No lesions on visible extremities Extremities: No edema  Neurological: Alert oriented x 4, grossly non-focal Psychological:  Alert and cooperative. Normal mood and affect  ASSESSMENT AND PLAN: *56 year old female with complaints of chronic diarrhea and rectal bleeding:  Had Cdiff twice in the past 2 years.  Bleeding has been occurring for over 5 years and previous colonoscopy reported ok. I think that she has several things playing into her diarrhea including the use of metformin 2000 mg daily and magnesium oxide 400 mg daily. Also reports a diagnosis of IBS since she was very young and that certainly could be contributing. Also could have a component of postcholecystectomy diarrhea. Given her history of C. difficile think it is worth checking a C. difficile by PCR stool study. I also asked her to discontinue the magnesium oxide since she just takes it for leg cramps and is unsure if it really helps much anyways.  Pending results of stool study we can then  make further recommendations. Question if we should repeat colonoscopy. She is on Brilinta so that would need to be held.  If we do repeat colonoscopy then would recommend random biopsies to rule out microscopic colitis.  Could consider trial of questran.  Will try to obtain previous colonoscopy records.  **Addendum:  Records received.  EGD by Dr. Lanae Boast at [facility] on 01/28/2013 performed for dysphagia showed mildly narrowed GE junction that was dilated with 54 [place].  No hiatal hernia.  Esophageal biopsies were normal.  Gastric biopsies showed patchy mild nonspecific chronic inflammation, no Hpylori.  Colonoscopy was performed for history of colon polyps (but we do not have any other previous colonoscopy records).  Colonoscopy was normal, no polyps or diverticulosis.  Prep was only fair but upgraded to adequate after multiple lavages and suctioning.  CC:  Sharlene Dory*

## 2017-05-08 NOTE — Patient Instructions (Addendum)
If you are age 56 or older, your body mass index should be between 23-30. Your Body mass index is 51.53 kg/m. If this is out of the aforementioned range listed, please consider follow up with your Primary Care Provider.  If you are age 9 or younger, your body mass index should be between 19-25. Your Body mass index is 51.53 kg/m. If this is out of the aformentioned range listed, please consider follow up with your Primary Care Provider.   Your physician has requested that you go to the basement for the following lab work before leaving today: C Diff PCR  DISCONTINUE Magnesium Oxide.  We have requested medical records from Dr. Lanae Boast.  Will call with results and further decision will be made when results are received.  Thank you for choosing me and [facility].   Doug Sou, PA-C

## 2017-05-08 NOTE — Progress Notes (Signed)
Reviewed and agree with initial management plan.  Malcolm T. Stark, MD FACG 

## 2017-05-16 ENCOUNTER — Other Ambulatory Visit: Payer: Self-pay

## 2017-05-16 MED ORDER — FLUTICASONE PROPIONATE HFA 44 MCG/ACT IN AERO: 2.0000 | INHALATION_SPRAY | Freq: Two times a day (BID) | RESPIRATORY_TRACT | 5 refills | Status: DC

## 2017-05-25 ENCOUNTER — Other Ambulatory Visit: Payer: Self-pay | Admitting: Allergy & Immunology

## 2017-06-01 ENCOUNTER — Telehealth: Payer: Self-pay | Admitting: Cardiology

## 2017-06-01 ENCOUNTER — Encounter: Payer: Self-pay | Admitting: *Deleted

## 2017-06-01 NOTE — Telephone Encounter (Signed)
Message sent to patient via mychart

## 2017-06-01 NOTE — Telephone Encounter (Signed)
New message   Pt's neurologist has given her some new medications that she wants to speak with Dr. Mayford Knife about because their side effects say something about heart problems.

## 2017-06-01 NOTE — Telephone Encounter (Signed)
Ok for pt to take but would advise pt to monitor her blood pressure for any changes, dizziness, fatigue, or edema as these are all relatively common side effects with the Neupro patch. Would also advise her to keep a close eye on her blood sugar (Neupro can decrease glucose levels).

## 2017-06-01 NOTE — Telephone Encounter (Signed)
Per patient, her neurologist John a PA for Dr. Loleta Chance w/[facility] in [facility]) gave her neupro 1 mg apply 1 patch daily for restless legs. Patient is concerned with taking this new medication after reading the precautions listed d/t her CAD and HTN. Please advise if its safe for patient to take this new medication.

## 2017-06-04 ENCOUNTER — Ambulatory Visit: Payer: BLUE CROSS/BLUE SHIELD | Admitting: Pediatrics

## 2017-06-22 ENCOUNTER — Other Ambulatory Visit: Payer: Self-pay | Admitting: Allergy & Immunology

## 2017-06-22 ENCOUNTER — Other Ambulatory Visit: Payer: Self-pay | Admitting: Cardiology

## 2017-06-22 ENCOUNTER — Other Ambulatory Visit: Payer: Self-pay | Admitting: Family Medicine

## 2017-06-22 DIAGNOSIS — J45909 Unspecified asthma, uncomplicated: Secondary | ICD-10-CM

## 2017-07-16 ENCOUNTER — Emergency Department (HOSPITAL_BASED_OUTPATIENT_CLINIC_OR_DEPARTMENT_OTHER)
Admission: EM | Admit: 2017-07-16 | Discharge: 2017-07-16 | Disposition: A | Discharge status: Home / Self Care | Payer: BLUE CROSS/BLUE SHIELD | Attending: Emergency Medicine | Admitting: Emergency Medicine

## 2017-07-16 ENCOUNTER — Other Ambulatory Visit: Payer: Self-pay

## 2017-07-16 ENCOUNTER — Emergency Department (HOSPITAL_BASED_OUTPATIENT_CLINIC_OR_DEPARTMENT_OTHER): Payer: BLUE CROSS/BLUE SHIELD

## 2017-07-16 ENCOUNTER — Encounter (HOSPITAL_BASED_OUTPATIENT_CLINIC_OR_DEPARTMENT_OTHER): Payer: Self-pay | Admitting: *Deleted

## 2017-07-16 DIAGNOSIS — K047 Periapical abscess without sinus: Secondary | ICD-10-CM | POA: Diagnosis not present

## 2017-07-16 DIAGNOSIS — Z794 Long term (current) use of insulin: Secondary | ICD-10-CM | POA: Diagnosis not present

## 2017-07-16 DIAGNOSIS — I5032 Chronic diastolic (congestive) heart failure: Secondary | ICD-10-CM | POA: Insufficient documentation

## 2017-07-16 DIAGNOSIS — E119 Type 2 diabetes mellitus without complications: Secondary | ICD-10-CM | POA: Diagnosis not present

## 2017-07-16 DIAGNOSIS — Z79899 Other long term (current) drug therapy: Secondary | ICD-10-CM | POA: Diagnosis not present

## 2017-07-16 DIAGNOSIS — I2510 Atherosclerotic heart disease of native coronary artery without angina pectoris: Secondary | ICD-10-CM | POA: Insufficient documentation

## 2017-07-16 DIAGNOSIS — Z87891 Personal history of nicotine dependence: Secondary | ICD-10-CM | POA: Diagnosis not present

## 2017-07-16 DIAGNOSIS — Z7982 Long term (current) use of aspirin: Secondary | ICD-10-CM | POA: Insufficient documentation

## 2017-07-16 DIAGNOSIS — Z9861 Coronary angioplasty status: Secondary | ICD-10-CM | POA: Diagnosis not present

## 2017-07-16 DIAGNOSIS — J45909 Unspecified asthma, uncomplicated: Secondary | ICD-10-CM | POA: Diagnosis not present

## 2017-07-16 DIAGNOSIS — Z9049 Acquired absence of other specified parts of digestive tract: Secondary | ICD-10-CM | POA: Diagnosis not present

## 2017-07-16 DIAGNOSIS — I110 Hypertensive heart disease with heart failure: Secondary | ICD-10-CM | POA: Insufficient documentation

## 2017-07-16 DIAGNOSIS — R220 Localized swelling, mass and lump, head: Secondary | ICD-10-CM

## 2017-07-16 DIAGNOSIS — E039 Hypothyroidism, unspecified: Secondary | ICD-10-CM | POA: Insufficient documentation

## 2017-07-16 DIAGNOSIS — K0889 Other specified disorders of teeth and supporting structures: Secondary | ICD-10-CM | POA: Diagnosis present

## 2017-07-16 LAB — CBC WITH DIFFERENTIAL/PLATELET
Basophils Absolute: 0.1 10*3/uL (ref 0.0–0.1)
Basophils Relative: 1 %
Eosinophils Absolute: 0.1 10*3/uL (ref 0.0–0.7)
Eosinophils Relative: 1 %
HCT: 44.9 % (ref 36.0–46.0)
Hemoglobin: 14.7 g/dL (ref 12.0–15.0)
Lymphocytes Relative: 20 %
Lymphs Abs: 2.2 10*3/uL (ref 0.7–4.0)
MCH: 28.6 pg (ref 26.0–34.0)
MCHC: 32.7 g/dL (ref 30.0–36.0)
MCV: 87.4 fL (ref 78.0–100.0)
Monocytes Absolute: 0.9 10*3/uL (ref 0.1–1.0)
Monocytes Relative: 8 %
Neutro Abs: 7.7 10*3/uL (ref 1.7–7.7)
Neutrophils Relative %: 70 %
Platelets: 246 10*3/uL (ref 150–400)
RBC: 5.14 MIL/uL — ABNORMAL HIGH (ref 3.87–5.11)
RDW: 14.2 % (ref 11.5–15.5)
WBC: 10.9 10*3/uL — ABNORMAL HIGH (ref 4.0–10.5)

## 2017-07-16 LAB — BASIC METABOLIC PANEL
Anion gap: 11 (ref 5–15)
BUN: 8 mg/dL (ref 6–20)
CO2: 26 mmol/L (ref 22–32)
Calcium: 9.5 mg/dL (ref 8.9–10.3)
Chloride: 97 mmol/L — ABNORMAL LOW (ref 101–111)
Creatinine, Ser: 0.88 mg/dL (ref 0.44–1.00)
GFR calc Af Amer: >60 mL/min (ref >60)
GFR calc non Af Amer: >60 mL/min (ref >60)
Glucose, Bld: 287 mg/dL — ABNORMAL HIGH (ref 65–99)
Potassium: 3.8 mmol/L (ref 3.5–5.1)
Sodium: 134 mmol/L — ABNORMAL LOW (ref 135–145)

## 2017-07-16 MED ORDER — ONDANSETRON HCL 4 MG/2ML IJ SOLN
4.0000 mg | Freq: Once | INTRAMUSCULAR | Status: AC
  Administered 2017-07-16: 4 mg via INTRAVENOUS
  Filled 2017-07-16: qty 2

## 2017-07-16 MED ORDER — AMOXICILLIN-POT CLAVULANATE 875-125 MG PO TABS: 1.0000 | ORAL_TABLET | Freq: Two times a day (BID) | ORAL | 0 refills | Status: DC

## 2017-07-16 MED ORDER — IOPAMIDOL (ISOVUE-300) INJECTION 61%
100.0000 mL | Freq: Once | INTRAVENOUS | Status: AC | PRN
  Administered 2017-07-16: 100 mL via INTRAVENOUS

## 2017-07-16 MED ORDER — MORPHINE SULFATE (PF) 4 MG/ML IV SOLN
4.0000 mg | Freq: Once | INTRAVENOUS | Status: AC
  Administered 2017-07-16: 4 mg via INTRAVENOUS
  Filled 2017-07-16: qty 1

## 2017-07-16 NOTE — ED Provider Notes (Signed)
Emergency Department Provider Note   I have reviewed the triage vital signs and the nursing notes.   HISTORY  Chief Complaint Dental Pain   HPI [name] is a 56 y.o. female with PMH of CAD, DM, Fibromyalgia, HLD, HTN, elevated BMI, and mixed connective tissue disease presents emergency department for evaluation of left upper dental pain with associated face swelling and fever.  Symptoms been worsening over the past 2-3 days.  Patient has had pain in the affected tooth previously.  She does not follow with a dentist regularly.  She has been taking her home medications including fentanyl patch with no significant relief in symptoms.  Today she recorded a fever of 101 F.  No recent antibiotics.  Denies any URI symptoms.  No acute difficulty speaking or swallowing.  Pain in the face is worse with opening the mouth or touching her left cheek.    Past Medical History:  Diagnosis Date  . Asthma    QVAR daily and Albuterol as needed  . C. difficile diarrhea    taking Keflex and Bactrim daily  . Chronic diastolic CHF (congestive heart failure) (HCC) 09/2015  . Chronic pain syndrome    Fentanyl Patch  . Colon polyps   . Coronary artery disease    cath with normal LM, 30% LAD, 85% mid RCA and 95% distal RCA s/p PCI of the mid to distal RCA and now on DAPT with ASA and Ticagrelor.    . Diabetes mellitus    takes Metformin and Hum R daily  . Eczema   . Excessive daytime sleepiness 11/26/2015  . Fibromyalgia   . Gallstones   . Gastric ulcer   . GERD (gastroesophageal reflux disease)   . Hyperlipidemia   . Hypertension    takes Metoprolol and Enalapril daily  . Hypothyroidism    takes Synthroid daily  . IBS (irritable bowel syndrome)   . Mixed connective tissue disease (HCC)   . Pneumonia   . Sepsis [facility])     Patient Active Problem List   Diagnosis Date Noted  . Chronic diarrhea 05/08/2017  . H/O Clostridium difficile infection 05/08/2017  . Rectal bleeding 05/08/2017  .  Hyperbilirubinemia 04/29/2016  . Hyponatremia 04/29/2016  . Sepsis (HCC) 04/28/2016  . Cellulitis of leg, right 03/09/2016  . Mild persistent asthma 02/15/2016  . Allergic rhinitis due to pollen 02/15/2016  . Anaphylactic reaction due to food 02/10/2016  . Coronary artery disease involving native coronary artery of native heart without angina pectoris 02/10/2016  . Gastroesophageal reflux disease without esophagitis 02/10/2016  . Atopic eczema 02/10/2016  . Cervical nerve root disorder 12/15/2015  . Lumbar radiculopathy 12/15/2015  . Daytime somnolence 11/26/2015  . Abnormal cardiovascular stress test 09/15/2015  . C. difficile diarrhea 02/12/2015  . Edema extremities 02/11/2015  . Abnormal LFTs 03/26/2014  . B-complex deficiency 03/26/2014  . Benign essential HTN 03/26/2014  . Chronic pain associated with significant psychosocial dysfunction 03/26/2014  . Diaphragmatic hernia 03/26/2014  . Acid reflux 03/26/2014  . Hammer toe 03/26/2014  . H/O neoplasm 03/26/2014  . 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 Diabetic polyneuropathy (HCC) 03/26/2014  . Deafness, sensorineural 03/26/2014  . Fibromyalgia 01/20/2014  . 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 Insulin dependent diabetes mellitus (HCC) 11/11/2012  . Hyperlipidemia LDL goal <70 11/11/2012  . Mixed connective tissue disease (HCC) 11/11/2012  . Hypothyroidism 11/11/2012  . 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HCC) 02/20/2012  . 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 Anti-RNP antibodies  present 07/17/2011  . ANA positive 07/17/2011    Past Surgical History:  Procedure Laterality Date  . ABDOMINAL HYSTERECTOMY    . APPENDECTOMY    . BREAST BIOPSY Bilateral    9 total  . CARDIAC CATHETERIZATION    . cardiac stents N/A 05/23/2016   pt notified department upon arrival for exam .....Marland Kitchen.2 stents  . CHOLECYSTECTOMY    . CORONARY ANGIOPLASTY     normal LM, 30% LAD, 85% mid RCA and 95% distal RCA s/p  PCI of the mid to distal RCA and now on DAPT with ASA and Ticagrelor.    . LEG SURGERY Left    muscle biospy  . TONSILLECTOMY AND ADENOIDECTOMY    . WRIST SURGERY Left     Current Outpatient Rx  . Order #: [id number] Class: Historical Med  . Order #: [id number] Class: Normal  . Order #: [id number] Class: Print  . Order #: [id number] Class: Historical Med  . Order #: [id number] Class: Historical Med  . Order #: [id number] Class: Normal  . Order #: [id number] Class: Historical Med  . Order #: [id number] Class: Normal  . Order #: [id number] Class: Historical Med  . Order #: [id number] Class: Historical Med  . Order #: [id number] Class: Print  . Order #: [id number] Class: Normal  . Order #: [id number] Class: Normal  . Order #: [id number] Class: Historical Med  . Order #: [id number] Class: Historical Med  . Order #: [id number] Class: Normal  . Order #: [id number] Class: Normal  . Order #: [id number] Class: Historical Med  . Order #: [id number] Class: Historical Med  . Order #: [id number] Class: Historical Med  . Order #: [id number] Class: Historical Med  . Order #: [id number] Class: Historical Med  . Order #: [id number] Class: Historical Med  . Order #: [id number] Class: Historical Med  . Order #: [id number] Class: Historical Med  . Order #: [id number] Class: Historical Med  . Order #: [id number] Class: Historical Med  . Order #: [id number] Class: Normal  . Order #: [id number] Class: Historical Med  . Order #: [id number] Class: Print  . Order #: [id number] Class: Historical Med  . Order #: [id number] Class: Historical Med  . Order #: [id number] Class: Historical Med  . Order #: [id number] Class: Historical Med  . Order #: [id number] Class: Historical Med  . Order #: [id number] Class: Normal  . Order #: [id number] Class: Print  . Order #: [id number] Class: Print  . Order #: [id number] Class: Historical Med  . Order #: [id number] Class: Historical Med  . Order #: [id number] Class: Normal  . Order #: [id number] Class: Historical Med    Allergies Atrovent; Fish  allergy; Fish oil; Fish oil [omega-3 fatty acids]; Ipratropium; Other; Crestor [rosuvastatin]; Gabapentin; Lovastatin; Monascus purpureus went yeast; Red yeast rice [cholestin]; Metronidazole; Shellfish allergy; Penicillins; Suprep [na sulfate-k sulfate-mg sulf]; and Tape  Family History  Problem Relation Age of Onset  . CAD Mother   . Hypertension Mother   . Heart attack Mother   . CAD Father   . Heart attack Father   . Allergic rhinitis Father   . Asthma Father   . Hypertension Brother   . Hypertension Brother   . Pancreatic cancer Paternal Aunt   . Breast cancer Paternal Aunt   . Lung cancer Paternal Aunt     Social History Social History   Tobacco Use  . Smoking status: Former Smoker    Packs/day: 1.00    Years: 20.00    Pack years: 20.00    Types: Cigarettes    Last attempt to quit: 02/11/2004    Years since quitting: 13.4  . Smokeless tobacco: Never Used  Substance Use Topics  . Alcohol use: Yes  Comment: once a month  . Drug use: No    Review of Systems  Constitutional: Positive fever/chills Eyes: No visual changes. ENT: No sore throat. Positive left face and ear pain.  Cardiovascular: Denies chest pain. Respiratory: Denies shortness of breath. Gastrointestinal: No abdominal pain.  No nausea, no vomiting.  No diarrhea.  No constipation. Genitourinary: Negative for dysuria. Musculoskeletal: Negative for back pain. Skin: Negative for rash. Neurological: Negative for headaches, focal weakness or numbness.  10-point ROS otherwise negative.  ____________________________________________   PHYSICAL EXAM:  VITAL SIGNS: ED Triage Vitals  Enc Vitals Group     BP 07/16/17 1838 (!) 156/81     Pulse Rate 07/16/17 1838 95     Resp 07/16/17 1838 18     Temp 07/16/17 1838 98.6 F (37 C)     Temp Source 07/16/17 1838 Oral     SpO2 07/16/17 1838 96 %     Weight 07/16/17 1834 278 lb (126.1 kg)     Height 07/16/17 1834 5\' 1"  (1.549 m)     Pain Score 07/16/17  1834 10   Constitutional: Alert and oriented. Well appearing and in no acute distress. Eyes: Conjunctivae are normal. PERRL. EOMI. Head: Atraumatic. Ears:  Healthy appearing ear canals and TMs bilaterally Nose: No congestion/rhinnorhea. Mouth/Throat: Mucous membranes are moist.  Oropharynx non-erythematous. Poor dentition with no visible gingival abscess. 2 cm trismus on exam. No PTA. Soft submandibular compartment.  Neck: No stridor.   Cardiovascular: Normal rate, regular rhythm. Good peripheral circulation. Grossly normal heart sounds.   Respiratory: Normal respiratory effort.  No retractions. Lungs CTAB. Gastrointestinal: No distention.  Musculoskeletal: No lower extremity tenderness nor edema. No gross deformities of extremities. Neurologic:  Normal speech and language. No gross focal neurologic deficits are appreciated.  Skin:  Skin is warm, dry and intact. No rash noted.  ____________________________________________   LABS (all labs ordered are listed, but only abnormal results are displayed)  Labs Reviewed  BASIC METABOLIC PANEL - Abnormal; Notable for the following components:      Result Value   Sodium 134 (*)    Chloride 97 (*)    Glucose, Bld 287 (*)    All other components within normal limits  CBC WITH DIFFERENTIAL/PLATELET - Abnormal; Notable for the following components:   WBC 10.9 (*)    RBC 5.14 (*)    All other components within normal limits   ____________________________________________  RADIOLOGY  Ct Maxillofacial W Contrast  Result Date: 07/16/2017 CLINICAL DATA:  Left facial swelling EXAM: CT MAXILLOFACIAL WITH CONTRAST TECHNIQUE: Multidetector CT imaging of the maxillofacial structures was performed with intravenous contrast. Multiplanar CT image reconstructions were also generated. CONTRAST:  13/01/2017 ISOVUE-300 IOPAMIDOL (ISOVUE-300) INJECTION 61% COMPARISON:  Head CT 02/23/2016 FINDINGS: Osseous: No facial fracture. Small periapical lucency at the root  of tooth 12, the left first maxillary premolar, with an associated large dental carie and overlying small subperiosteal abscess measuring approximately 2 x 8 mm. Orbits: Normal Sinuses: Left maxillary retention cysts.  Otherwise normal. Soft tissues: There is a large amount of left facial soft tissue swelling that overlies the above-described dental abnormality. There is no soft tissue abscess or drainable fluid collection in the left facial tissues. There is extensive inflammatory infiltration of the subcutaneous fat. Limited intracranial: Unremarkable IMPRESSION: Odontogenic left facial cellulitis, arising from periapical lucency at the root of the left first maxillary premolar, tooth 12, where there is a small subperiosteal abscess measuring 2 x 8 mm. Electronically Signed   By: 02/25/2016  Chase Picket M.D.   On: 07/16/2017 20:18    ____________________________________________   PROCEDURES  Procedure(s) performed:   Procedures  None ____________________________________________   INITIAL IMPRESSION / ASSESSMENT AND PLAN / ED COURSE  Pertinent labs & imaging results that were available during my care of the patient were reviewed by me and considered in my medical decision making (see chart for details).  Resents emergency department for evaluation of left face pain in the setting of severe dental pain.  Patient with fevers at home.  No visible abscess on my exam.  She does have significant swelling to the left side of her face.  Plan for CT of the face with contrast along with labs.  Will provide pain control in the emergency department.  CT reviewed and discussed with patient. Will start Augmentin. Listed Penicillin allergy in Epic but reaction was as a child and there is a note discussing penicillin challenge with Allergist in 2016. Patient to call her dentist in the AM.   At this time, I do not feel there is any life-threatening condition present. I have reviewed and discussed all results (EKG,  imaging, lab, urine as appropriate), exam findings with patient. I have reviewed nursing notes and appropriate previous records.  I feel the patient is safe to be discharged home without further emergent workup. Discussed usual and customary return precautions. Patient and family (if present) verbalize understanding and are comfortable with this plan.  Patient will follow-up with their primary care provider. If they do not have a primary care provider, information for follow-up has been provided to them. All questions have been answered.  ____________________________________________  FINAL CLINICAL IMPRESSION(S) / ED DIAGNOSES  Final diagnoses:  Periapical abscess  Swelling of face     MEDICATIONS GIVEN DURING THIS VISIT:  Medications  morphine 4 MG/ML injection 4 mg (4 mg Intravenous Given 07/16/17 1924)  iopamidol (ISOVUE-300) 61 % injection 100 mL (100 mLs Intravenous Contrast Given 07/16/17 1938)  ondansetron (ZOFRAN) injection 4 mg (4 mg Intravenous Given 07/16/17 2018)     NEW OUTPATIENT MEDICATIONS STARTED DURING THIS VISIT:  Augmentin    Note:  This document was prepared using Dragon voice recognition software and may include unintentional dictation errors.  Alona Bene, MD Emergency Medicine    Naithen Rivenburg, Arlyss Repress, MD 07/17/17 [id number]

## 2017-07-16 NOTE — Discharge Instructions (Signed)
You were seen in the ED today with face pain and swelling. We found a dental infection on CT. You will need to see your PCP and Dentist ASAP. We are starting you on antibiotics. Take your home pain medication as prescribed.

## 2017-07-16 NOTE — ED Triage Notes (Signed)
Facial swelling and dental pain. Flushed. States she has had a fever.

## 2017-07-23 ENCOUNTER — Other Ambulatory Visit: Payer: Self-pay | Admitting: Family Medicine

## 2017-07-23 ENCOUNTER — Other Ambulatory Visit: Payer: Self-pay | Admitting: Pediatrics

## 2017-07-23 DIAGNOSIS — I10 Essential (primary) hypertension: Secondary | ICD-10-CM

## 2017-07-23 DIAGNOSIS — E559 Vitamin D deficiency, unspecified: Secondary | ICD-10-CM

## 2017-07-24 ENCOUNTER — Other Ambulatory Visit: Payer: Self-pay | Admitting: Emergency Medicine

## 2017-07-25 ENCOUNTER — Other Ambulatory Visit: Payer: Self-pay | Admitting: Family Medicine

## 2017-07-25 ENCOUNTER — Encounter: Payer: Self-pay | Admitting: Family Medicine

## 2017-07-25 ENCOUNTER — Ambulatory Visit (INDEPENDENT_AMBULATORY_CARE_PROVIDER_SITE_OTHER): Payer: BLUE CROSS/BLUE SHIELD | Admitting: Family Medicine

## 2017-07-25 VITALS — BP 140/70 | HR 90 | Temp 98.3°F | Ht 61.0 in | Wt 287.0 lb

## 2017-07-25 DIAGNOSIS — I10 Essential (primary) hypertension: Secondary | ICD-10-CM | POA: Diagnosis not present

## 2017-07-25 DIAGNOSIS — K029 Dental caries, unspecified: Secondary | ICD-10-CM

## 2017-07-25 DIAGNOSIS — E559 Vitamin D deficiency, unspecified: Secondary | ICD-10-CM

## 2017-07-25 LAB — VITAMIN D 25 HYDROXY (VIT D DEFICIENCY, FRACTURES): VITD: 69.04 ng/mL (ref 30.00–100.00)

## 2017-07-25 MED ORDER — VITAMIN D (ERGOCALCIFEROL) 1.25 MG (50000 UNIT) PO CAPS: ORAL_CAPSULE | ORAL | 9 refills | Status: DC

## 2017-07-25 MED ORDER — ENALAPRIL MALEATE 10 MG PO TABS: 10.0000 mg | ORAL_TABLET | Freq: Two times a day (BID) | ORAL | 3 refills | Status: DC

## 2017-07-25 NOTE — Patient Instructions (Addendum)
[email or web address]  Keep moving. Anything is better.   I would consider getting in touch with Dr. Ancil Linsey about starting a medicine for your pain.   Let us know if you need anything.

## 2017-07-25 NOTE — Progress Notes (Signed)
Chief Complaint  Patient presents with  . Hospitalization Follow-up    Subjective: Patient is a 56 y.o. female here for ED f/u. Dx'd on 11/5 w periapical abscess, tx'd with Augmentin. Dose finished, tolerated well.  She is feeling much better.  She is wondering if she needs more time before having her tooth pulled.  She has an appointment with her dentist on Friday.  No fevers, drainage, or strange taste in her mouth.  She does have a connective tissue disorder that she believes contributes to her poor dentition.  Would like PCN allergy removed from list.   The patient would also like to discuss chronic pain.  She receives injections in her back.  She follows with a rheumatologist for autoimmune disease.  She also has fibromyalgia.  She has failed Cymbalta, high doses of Neurontin, Lyrica.  She tries to remain physically active, however with the pain, this is difficult.   ROS: Const: no fevers HEENT: No pus Skin: No rashes  Family History  Problem Relation Age of Onset  . CAD Mother   . Hypertension Mother   . Heart attack Mother   . CAD Father   . Heart attack Father   . Allergic rhinitis Father   . Asthma Father   . Hypertension Brother   . Hypertension Brother   . Pancreatic cancer Paternal Aunt   . Breast cancer Paternal Aunt   . Lung cancer Paternal Aunt    Past Medical History:  Diagnosis Date  . Asthma    QVAR daily and Albuterol as needed  . C. difficile diarrhea    taking Keflex and Bactrim daily  . Chronic diastolic CHF (congestive heart failure) (HCC) 09/2015  . Chronic pain syndrome    Fentanyl Patch  . Colon polyps   . Coronary artery disease    cath with normal LM, 30% LAD, 85% mid RCA and 95% distal RCA s/p PCI of the mid to distal RCA and now on DAPT with ASA and Ticagrelor.    . Diabetes mellitus    takes Metformin and Hum R daily  . Eczema   . Excessive daytime sleepiness 11/26/2015  . Fibromyalgia   . Gallstones   . Gastric ulcer   . GERD  (gastroesophageal reflux disease)   . Hyperlipidemia   . Hypertension    takes Metoprolol and Enalapril daily  . Hypothyroidism    takes Synthroid daily  . IBS (irritable bowel syndrome)   . Mixed connective tissue disease (HCC)   . Pneumonia   . Sepsis (HCC)    Allergies  Allergen Reactions  . Atrovent Shortness Of Breath    Wheezing becomes worse  . Fish Allergy Anaphylaxis    INCLUDES OMEGA 3 OILS  . Fish Oil Anaphylaxis and Swelling    THROAT SWELLS INCLUDES FISH AS A CLASS  . Fish Oil [Omega-3 Fatty Acids] Swelling    Throat swelling   . Ipratropium Shortness Of Breath  . Other Anaphylaxis and Other (See Comments)    UNSPECIFIED REACTION  Perch  . Crestor [Rosuvastatin] Other (See Comments)    Extreme joint pain, to this & other statins  . Gabapentin Anxiety    Anxiety on high doses  . Lovastatin Other (See Comments)    EXTREME JOINT PAIN  . Monascus Purpureus Went Yeast Other (See Comments)    Muscle pain and severe joint pain.   . Red Yeast Rice [Cholestin] Other (See Comments)    Muscle pain and severe joint pain.   . Metronidazole  Nausea Only and Swelling    Nausea and vomiting   . Neupro [Rotigotine]     Extreme edema   . Shellfish Allergy Other (See Comments)    UNSPECIFIED REACTION   . Suprep [Na Sulfate-K Sulfate-Mg Sulf] Nausea And Vomiting  . Tape Other (See Comments)    Electrodes causes skin breakdown    Current Outpatient Medications:  .  albuterol (PROVENTIL) (2.5 MG/3ML) 0.083% nebulizer solution, Inhale 2.5 mLs into the lungs every 4 (four) hours as needed for wheezing or shortness of breath., Disp: 75 mL, Rfl: 1 .  aspirin EC 81 MG tablet, Take 81 mg by mouth daily., Disp: , Rfl:  .  b complex vitamins tablet, Take 1 tablet by mouth daily., Disp: , Rfl:  .  beclomethasone (QVAR) 40 MCG/ACT inhaler, ONE PUFF TWICE A DAY TO PREVENT COUGH OR WHEEZE. USE SPACER. Rinse, gargle and spit out after use, Disp: 1 Inhaler, Rfl: 5 .  betamethasone  dipropionate (DIPROLENE) 0.05 % cream, Apply 1 application topically daily as needed (eczema)., Disp: , Rfl:  .  BRILINTA 90 MG TABS tablet, TAKE ONE TABLET BY MOUTH TWICE DAILY, Disp: 180 tablet, Rfl: 4 .  cetirizine (ZYRTEC) 10 MG tablet, Take 10 mg by mouth daily as needed for allergies., Disp: , Rfl:  .  clotrimazole-betamethasone (LOTRISONE) cream, Apply 1 application topically daily as needed (eczema)., Disp: , Rfl:  .  diphenhydrAMINE (BENADRYL) 25 MG tablet, Take 1 tablet (25 mg total) by mouth every 6 (six) hours as needed for itching or allergies., Disp: 10 tablet, Rfl: 0 .  enalapril (VASOTEC) 10 MG tablet, Take 1 tablet (10 mg total) 2 (two) times daily by mouth., Disp: 180 tablet, Rfl: 3 .  EPINEPHrine (EPIPEN 2-PAK) 0.3 mg/0.3 mL IJ SOAJ injection, USE AS DIRECTED FOR SEVERE ALLERGIC REACTION., Disp: 2 Device, Rfl: 3 .  Estrogens, Conjugated (PREMARIN VA), Place 1 application vaginally 3 (three) times a week. On Saturdays, Mondays, and Wednesday (cream), Disp: , Rfl:  .  fentaNYL (DURAGESIC - DOSED MCG/HR) 25 MCG/HR patch, Place 1 patch onto the skin every 3 (three) days., Disp: , Rfl: 0 .  FENTANYL HCL TD, Place onto the skin., Disp: , Rfl:  .  fluticasone (FLONASE) 50 MCG/ACT nasal spray, USE 2 SPRAY(S) IN EACH NOSTRIL ONCE DAILY AS NEEDED FOR ALLERGIES OR RHINITIS, Disp: 16 g, Rfl: 0 .  fluticasone (FLOVENT HFA) 44 MCG/ACT inhaler, Inhale 2 puffs into the lungs 2 (two) times daily., Disp: 1 Inhaler, Rfl: 5 .  furosemide (LASIX) 40 MG tablet, Take 40 mg by mouth daily as needed for fluid. , Disp: , Rfl:  .  gabapentin (NEURONTIN) 100 MG capsule, Take 400-700 mg by mouth See admin instructions. Pt takes 400mg  in the morning, 400mg  at lunchtime and 700mg  in the evening, Disp: , Rfl:  .  GLUCOMANNAN PO, Take 700 mg by mouth 2 (two) times daily., Disp: , Rfl:  .  glucose blood (BAYER CONTOUR TEST) test strip, CHECK BLOOD SUGAR 4 TIMES DAILY, Disp: , Rfl:  .  Guaifenesin (MUCINEX MAXIMUM  STRENGTH) 1200 MG TB12, Take 1,200 mg by mouth 2 (two) times daily., Disp: , Rfl:  .  hydroxychloroquine (PLAQUENIL) 200 MG tablet, Take 200 mg by mouth 2 (two) times daily., Disp: , Rfl:  .  insulin regular human CONCENTRATED (HUMULIN R) 500 UNIT/ML SOLN injection, Inject 0-100 Units into the skin 4 (four) times daily -  before meals and at bedtime. TID w/ meals Cbgs <125 = no insulin  125-150 = 10 units using insulin syringe ( actual dose is 50 units)          151 - 200 = 15 units using insulin syringe ( actual dose is 75 units)          > 201 = 20 units using insulin syringe ( actual dose is 100 units)  QHS Cbgs <150 = no insulin          151-200= 5 units using insulin syringe ( actual dose is 25 units)          201 - 300 = 10 units using insulin syringe ( actual dose is 50 units)          > 300 = 15 units using insulin syringe ( actual dose is 75 units), Disp: , Rfl:  .  levalbuterol (XOPENEX) 1.25 MG/0.5ML nebulizer solution, Take 1 ampule by nebulization 4 (four) times daily as needed for wheezing or shortness of breath., Disp: , Rfl:  .  levothyroxine (SYNTHROID, LEVOTHROID) 200 MCG tablet, Take 200 mcg by mouth daily before breakfast. , Disp: , Rfl:  .  metFORMIN (GLUCOPHAGE-XR) 500 MG 24 hr tablet, Take 1,000 mg by mouth 2 (two) times daily., Disp: , Rfl: 0 .  metoprolol tartrate (LOPRESSOR) 25 MG tablet, TAKE ONE TABLET BY MOUTH TWICE DAILY, Disp: 180 tablet, Rfl: 1 .  milk thistle 175 MG tablet, Take 175 mg by mouth 2 (two) times daily. , Disp: , Rfl:  .  ondansetron (ZOFRAN) 4 MG tablet, Take 1 tablet (4 mg total) by mouth every 8 (eight) hours as needed for nausea or vomiting., Disp: 20 tablet, Rfl: 0 .  oxyCODONE (ROXICODONE) 15 MG immediate release tablet, Take 1 tablet by mouth every 4 (four) hours as needed., Disp: , Rfl: 0 .  Oxycodone HCl 10 MG TABS, Take 10 mg by mouth every 4 (four) hours as needed (for pain)., Disp: , Rfl:  .  oxymetazoline (AFRIN) 0.05 % nasal spray, Place 1  spray into both nostrils 2 (two) times daily as needed for congestion., Disp: , Rfl:  .  Polyethyl Glycol-Propyl Glycol (SYSTANE ULTRA OP), Apply 1 drop to eye daily as needed (dry eyes)., Disp: , Rfl:  .  promethazine (PHENERGAN) 25 MG tablet, Take 25 mg by mouth every 6 (six) hours as needed for nausea or vomiting., Disp: , Rfl:  .  PROVENTIL HFA 108 (90 Base) MCG/ACT inhaler, INHALE 2 PUFFS BY MOUTH EVERY 4 HOURS AS NEEDED FOR WHEEZING OR  SHORTNESS  OF  BREATH, Disp: 7 each, Rfl: 1 .  saccharomyces boulardii (FLORASTOR) 250 MG capsule, Take 1 capsule (250 mg total) by mouth 2 (two) times daily., Disp: 30 capsule, Rfl: 0 .  silver sulfADIAZINE (SILVADENE) 1 % cream, Apply 1 application topically daily., Disp: 50 g, Rfl: 2 .  vitamin A 1610910000 UNIT capsule, Take 10,000 Units by mouth daily., Disp: , Rfl:  .  vitamin B-12 (CYANOCOBALAMIN) 1000 MCG tablet, Take 1,000 mcg by mouth daily.  , Disp: , Rfl:  .  Vitamin D, Ergocalciferol, (DRISDOL) 50000 units CAPS capsule, TAKE 1 CAP BY MOUTH ONCE A WEEK ON MONDAY, Disp: 12 capsule, Rfl: 9 .  Vitamins-Lipotropics (LIPO-FLAVONOID PLUS PO), Take 1 tablet by mouth 3 (three) times daily., Disp: , Rfl:   Objective: BP 140/70 (BP Location: Right Arm, Patient Position: Sitting, Cuff Size: Large)   Pulse 90   Temp 98.3 F (36.8 C) (Oral)   Ht 5\' 1"  (1.549 m)   Wt 287 lb (  130.2 kg)   SpO2 95%   BMI 54.23 kg/m  General: Awake, appears stated age HEENT: MMM, EOMi Heart: RRR, no murmurs Lungs: CTAB, no rales, wheezes or rhonchi. No accessory muscle use Abd: BS+, soft, NT, ND, no masses or organomegaly Psych: Age appropriate judgment and insight, normal affect and mood  Assessment and Plan: Dental decay  Vitamin D deficiency - Plan: Vitamin D, Ergocalciferol, (DRISDOL) 50000 units CAPS capsule, Vitamin D (25 hydroxy)  Essential hypertension, benign - Plan: enalapril (VASOTEC) 10 MG tablet  No contraindication to having tooth extracted. Check vitamin  D, refill high dose capsules. Refill Vasotec. Fibro-guide, selectivity, following up with rheumatology for disease modifying therapy encouraged to the patient. Follow-up in 6 months for a med check. The patient voiced understanding and agreement to the plan.  Greater than 25 minutes were spent face to face with the patient with greater than 50% of this time spent counseling on dental issue, fibromyalgia, chronic pain, and treatment options.    Jilda Roche [place], DO 07/25/17  12:10 PM

## 2017-07-25 NOTE — Progress Notes (Signed)
Pre visit review using our clinic review tool, if applicable. No additional management support is needed unless otherwise documented below in the visit note. 

## 2017-07-27 ENCOUNTER — Other Ambulatory Visit: Payer: Self-pay | Admitting: Family Medicine

## 2017-07-27 MED ORDER — FLUTICASONE PROPIONATE 50 MCG/ACT NA SUSP: NASAL | 0 refills | Status: DC

## 2017-08-11 ENCOUNTER — Other Ambulatory Visit: Payer: Self-pay | Admitting: Family Medicine

## 2017-08-11 DIAGNOSIS — J45909 Unspecified asthma, uncomplicated: Secondary | ICD-10-CM

## 2017-08-14 NOTE — Telephone Encounter (Signed)
Relation to pt: self Call back number: [phone] Pharmacy: [facility] [address], [place] - [address] [phone] (Phone) [phone] (Fax)     Reason for call:  Patient checking on the status of albuterol (PROVENTIL) inhaler, please advise

## 2017-08-15 ENCOUNTER — Encounter: Payer: Self-pay | Admitting: Family Medicine

## 2017-08-15 DIAGNOSIS — J45909 Unspecified asthma, uncomplicated: Secondary | ICD-10-CM

## 2017-08-15 MED ORDER — ALBUTEROL SULFATE HFA 108 (90 BASE) MCG/ACT IN AERS: INHALATION_SPRAY | RESPIRATORY_TRACT | 3 refills | Status: DC

## 2017-08-15 NOTE — Telephone Encounter (Signed)
Can we reorder this please? TY.

## 2017-09-02 ENCOUNTER — Other Ambulatory Visit: Payer: Self-pay | Admitting: Pediatrics

## 2017-10-03 ENCOUNTER — Ambulatory Visit (INDEPENDENT_AMBULATORY_CARE_PROVIDER_SITE_OTHER): Payer: BLUE CROSS/BLUE SHIELD | Admitting: Family Medicine

## 2017-10-03 ENCOUNTER — Encounter: Payer: Self-pay | Admitting: Family Medicine

## 2017-10-03 VITALS — BP 128/72 | HR 110 | Temp 98.6°F | Ht 61.0 in | Wt 292.4 lb

## 2017-10-03 DIAGNOSIS — J4541 Moderate persistent asthma with (acute) exacerbation: Secondary | ICD-10-CM | POA: Diagnosis not present

## 2017-10-03 DIAGNOSIS — J45909 Unspecified asthma, uncomplicated: Secondary | ICD-10-CM

## 2017-10-03 DIAGNOSIS — J0100 Acute maxillary sinusitis, unspecified: Secondary | ICD-10-CM | POA: Diagnosis not present

## 2017-10-03 MED ORDER — BETAMETHASONE DIPROPIONATE 0.05 % EX CREA: 1.0000 "application " | TOPICAL_CREAM | Freq: Every day | CUTANEOUS | 0 refills | Status: DC | PRN

## 2017-10-03 MED ORDER — PREDNISONE 20 MG PO TABS: 40.0000 mg | ORAL_TABLET | Freq: Every day | ORAL | 0 refills | Status: AC

## 2017-10-03 MED ORDER — FLUTICASONE PROPIONATE 50 MCG/ACT NA SUSP: NASAL | 0 refills | Status: DC

## 2017-10-03 MED ORDER — AMOXICILLIN-POT CLAVULANATE 875-125 MG PO TABS: 1.0000 | ORAL_TABLET | Freq: Two times a day (BID) | ORAL | 0 refills | Status: DC

## 2017-10-03 MED ORDER — ALBUTEROL SULFATE HFA 108 (90 BASE) MCG/ACT IN AERS: INHALATION_SPRAY | RESPIRATORY_TRACT | 3 refills | Status: DC

## 2017-10-03 MED ORDER — ALBUTEROL SULFATE (2.5 MG/3ML) 0.083% IN NEBU: 2.5000 mL | INHALATION_SOLUTION | RESPIRATORY_TRACT | 1 refills | Status: DC | PRN

## 2017-10-03 MED ORDER — FLUTICASONE PROPIONATE HFA 44 MCG/ACT IN AERO: 2.0000 | INHALATION_SPRAY | Freq: Two times a day (BID) | RESPIRATORY_TRACT | 5 refills | Status: DC

## 2017-10-03 NOTE — Progress Notes (Signed)
Chief Complaint  Patient presents with  . Sinus Problem    [name] here for URI complaints.  Duration: 5 days  Associated symptoms: sinus congestion, rhinorrhea, itchy watery eyes, ear pain, sore throat, wheezing, shortness of breath, and low grade fevers Denies: ear drainage, new myalgias and rigors Treatment to date: herbal teas Sick contacts: Yes  ROS:  Const: Denies fevers HEENT: As noted in HPI Lungs: No SOB  Past Medical History:  Diagnosis Date  . Asthma    QVAR daily and Albuterol as needed  . Chronic diastolic CHF (congestive heart failure) (HCC) 09/2015  . Chronic pain syndrome    Fentanyl Patch  . Colon polyps   . Coronary artery disease    cath with normal LM, 30% LAD, 85% mid RCA and 95% distal RCA s/p PCI of the mid to distal RCA and now on DAPT with ASA and Ticagrelor.    . Diabetes mellitus    takes Metformin and Hum R daily  . Eczema   . Excessive daytime sleepiness 11/26/2015  . Fibromyalgia   . Gallstones   . GERD (gastroesophageal reflux disease)   . Hyperlipidemia   . Hypertension    takes Metoprolol and Enalapril daily  . Hypothyroidism    takes Synthroid daily  . IBS (irritable bowel syndrome)   . Mixed connective tissue disease (HCC)      BP 128/72 (BP Location: Left Arm, Patient Position: Sitting, Cuff Size: Large)   Pulse (!) 110   Temp 98.6 F (37 C) (Oral)   Ht 5\' 1"  (1.549 m)   Wt 292 lb 6 oz (132.6 kg)   SpO2 95%   BMI 55.24 kg/m  General: Awake, alert, appears stated age HEENT: AT, [place], ears patent b/l and TM's neg, +TTP over max sinuses, nares patent w/o discharge, pharynx pink and without exudates, MMM Neck: No masses or asymmetry Heart: RRR Lungs: diff exp wheeze, no accessory muscle use, +conversational dyspnea Psych: Age appropriate judgment and insight, normal mood and affect  Moderate persistent asthma with acute exacerbation - Plan: predniSONE (DELTASONE) 20 MG tablet  Acute maxillary sinusitis, recurrence not  specified - Plan: amoxicillin-clavulanate (AUGMENTIN) 875-125 MG tablet  Orders as above. Pocket rx instructions. URI likely triggered asthma, tx with po steroids given audible wheezes on exam. Cont home inhalers.  Continue to push fluids, practice good hand hygiene, cover mouth when coughing. F/u prn. If starting to experience fevers, shaking, or shortness of breath, seek immediate care. Pt voiced understanding and agreement to the plan.  [place], DO 10/03/17 11:53 AM

## 2017-10-03 NOTE — Patient Instructions (Addendum)
Continue to push fluids, practice good hand hygiene, and cover your mouth if you cough.  If you start having fevers, shaking or shortness of breath, seek immediate care.  Most sinus infections are viral in etiology and antibiotics will not be helpful. That being said, if you start having worsening symptoms over 3 days, you are worsening by day 10 or not improving by day 14, go ahead and take it. You are on Day 5 as of now.   Let us know if you need anything.

## 2017-10-03 NOTE — Progress Notes (Signed)
Pre visit review using our clinic review tool, if applicable. No additional management support is needed unless otherwise documented below in the visit note. 

## 2017-10-04 ENCOUNTER — Telehealth: Payer: Self-pay | Admitting: Family Medicine

## 2017-10-04 NOTE — Telephone Encounter (Signed)
She can go to ER, come back here, or if symptoms are manageable, wait it out. TY.   Copied from CRM [id number]. Topic: General - Other >> Oct 04, 2017 12:45 PM Gerrianne Scale wrote: Reason for CRM: patient calling stating that none of the albuterol is working that she is having to do double the vials of albuterol every 4 hours and its not working

## 2017-10-04 NOTE — Telephone Encounter (Signed)
Called informed the patient of PCP instructions. She did verbalize understanding and will wait until tomorrow before being seen either here or ER.

## 2017-10-05 ENCOUNTER — Ambulatory Visit (HOSPITAL_BASED_OUTPATIENT_CLINIC_OR_DEPARTMENT_OTHER)
Admission: RE | Admit: 2017-10-05 | Discharge: 2017-10-05 | Disposition: A | Discharge status: Home / Self Care | Payer: BLUE CROSS/BLUE SHIELD | Source: Ambulatory Visit | Attending: Family Medicine | Admitting: Family Medicine

## 2017-10-05 ENCOUNTER — Ambulatory Visit (INDEPENDENT_AMBULATORY_CARE_PROVIDER_SITE_OTHER): Payer: BLUE CROSS/BLUE SHIELD | Admitting: Family Medicine

## 2017-10-05 ENCOUNTER — Encounter: Payer: Self-pay | Admitting: Family Medicine

## 2017-10-05 VITALS — BP 130/78 | HR 99 | Temp 98.5°F | Ht 61.0 in | Wt 292.0 lb

## 2017-10-05 DIAGNOSIS — Z1231 Encounter for screening mammogram for malignant neoplasm of breast: Secondary | ICD-10-CM | POA: Diagnosis not present

## 2017-10-05 DIAGNOSIS — R05 Cough: Secondary | ICD-10-CM | POA: Diagnosis present

## 2017-10-05 DIAGNOSIS — J45909 Unspecified asthma, uncomplicated: Secondary | ICD-10-CM | POA: Insufficient documentation

## 2017-10-05 DIAGNOSIS — Z1239 Encounter for other screening for malignant neoplasm of breast: Secondary | ICD-10-CM

## 2017-10-05 MED ORDER — IPRATROPIUM-ALBUTEROL 0.5-2.5 (3) MG/3ML IN SOLN
3.0000 mL | Freq: Four times a day (QID) | RESPIRATORY_TRACT | Status: DC
  Administered 2017-10-05: 3 mL via RESPIRATORY_TRACT

## 2017-10-05 MED ORDER — FLUTICASONE PROPIONATE HFA 44 MCG/ACT IN AERO: 2.0000 | INHALATION_SPRAY | Freq: Two times a day (BID) | RESPIRATORY_TRACT | 5 refills | Status: DC

## 2017-10-05 MED ORDER — FLUTICASONE PROPIONATE 50 MCG/ACT NA SUSP
NASAL | 0 refills | Status: DC
Start: 2017-10-05 — End: 2017-11-27

## 2017-10-05 MED ORDER — ALBUTEROL SULFATE HFA 108 (90 BASE) MCG/ACT IN AERS: INHALATION_SPRAY | RESPIRATORY_TRACT | 3 refills | Status: DC

## 2017-10-05 MED ORDER — IPRATROPIUM-ALBUTEROL 0.5-2.5 (3) MG/3ML IN SOLN: 3.0000 mL | RESPIRATORY_TRACT | 1 refills | Status: DC | PRN

## 2017-10-05 MED ORDER — ALBUTEROL SULFATE (2.5 MG/3ML) 0.083% IN NEBU: 2.5000 mL | INHALATION_SOLUTION | RESPIRATORY_TRACT | 1 refills | Status: DC | PRN

## 2017-10-05 MED ORDER — AZITHROMYCIN 250 MG PO TABS: ORAL_TABLET | ORAL | 0 refills | Status: DC

## 2017-10-05 NOTE — Progress Notes (Signed)
Pre visit review using our clinic review tool, if applicable. No additional management support is needed unless otherwise documented below in the visit note. 

## 2017-10-05 NOTE — Progress Notes (Signed)
Chief Complaint  Patient presents with  . Wheezing  . Breathing Problem    Subjective: Patient is a 57 y.o. female here for f/u asthma exacerbation.  Seen 2 days ago and treated w PO steroid and supportive care. Abx for sinusitis given as pocket rx. Since then, she has sig worsened. She has been compliant with PO steroids. She has been using neb tx at home frequently without relief. She has been compliant with home inhalers. Denies fevers, cough is rough for her. No other new s/s's.   ROS: Heart: Denies chest pain  Lungs: +SOB   Family History  Problem Relation Age of Onset  . CAD Mother   . Hypertension Mother   . Heart attack Mother   . CAD Father   . Heart attack Father   . Allergic rhinitis Father   . Asthma Father   . Hypertension Brother   . Hypertension Brother   . Pancreatic cancer Paternal Aunt   . Breast cancer Paternal Aunt   . Lung cancer Paternal Aunt    Past Medical History:  Diagnosis Date  . Asthma    QVAR daily and Albuterol as needed  . Chronic diastolic CHF (congestive heart failure) (HCC) 09/2015  . Chronic pain syndrome    Fentanyl Patch  . Colon polyps   . Coronary artery disease    cath with normal LM, 30% LAD, 85% mid RCA and 95% distal RCA s/p PCI of the mid to distal RCA and now on DAPT with ASA and Ticagrelor.    . Diabetes mellitus    takes Metformin and Hum R daily  . Eczema   . Excessive daytime sleepiness 11/26/2015  . Fibromyalgia   . Gallstones   . GERD (gastroesophageal reflux disease)   . Hyperlipidemia   . Hypertension    takes Metoprolol and Enalapril daily  . Hypothyroidism    takes Synthroid daily  . IBS (irritable bowel syndrome)   . Mixed connective tissue disease (HCC)    Allergies  Allergen Reactions  . Fish Allergy Anaphylaxis    INCLUDES OMEGA 3 OILS  . Fish Oil Anaphylaxis and Swelling    THROAT SWELLS INCLUDES FISH AS A CLASS  . Fish Oil [Omega-3 Fatty Acids] Swelling    Throat swelling   . Other Anaphylaxis  and Other (See Comments)    UNSPECIFIED REACTION  Perch  . Crestor [Rosuvastatin] Other (See Comments)    Extreme joint pain, to this & other statins  . Gabapentin Anxiety    Anxiety on high doses  . Lovastatin Other (See Comments)    EXTREME JOINT PAIN  . Monascus Purpureus Went Yeast Other (See Comments)    Muscle pain and severe joint pain.   . Red Yeast Rice [Cholestin] Other (See Comments)    Muscle pain and severe joint pain.   . Metronidazole Nausea Only and Swelling    Nausea and vomiting   . Neupro [Rotigotine]     Extreme edema   . Shellfish Allergy Other (See Comments)    UNSPECIFIED REACTION   . Suprep [Na Sulfate-K Sulfate-Mg Sulf] Nausea And Vomiting  . Tape Other (See Comments)    Electrodes causes skin breakdown    Current Outpatient Medications:  .  albuterol (PROVENTIL HFA) 108 (90 Base) MCG/ACT inhaler, INHALE 2 PUFFS BY MOUTH EVERY 4 HOURS AS NEEDED FOR WHEEZING AND FOR SHORTNESS OF BREATH, Disp: 1 each, Rfl: 3 .  aspirin EC 81 MG tablet, Take 81 mg by mouth daily., Disp: , Rfl:  .  b complex vitamins tablet, Take 1 tablet by mouth daily., Disp: , Rfl:  .  betamethasone dipropionate (DIPROLENE) 0.05 % cream, Apply 1 application topically daily as needed (eczema)., Disp: 30 g, Rfl: 0 .  BRILINTA 90 MG TABS tablet, TAKE ONE TABLET BY MOUTH TWICE DAILY, Disp: 180 tablet, Rfl: 4 .  cetirizine (ZYRTEC) 10 MG tablet, Take 10 mg by mouth daily as needed for allergies., Disp: , Rfl:  .  clotrimazole-betamethasone (LOTRISONE) cream, Apply 1 application topically daily as needed (eczema)., Disp: , Rfl:  .  diphenhydrAMINE (BENADRYL) 25 MG tablet, Take 1 tablet (25 mg total) by mouth every 6 (six) hours as needed for itching or allergies., Disp: 10 tablet, Rfl: 0 .  enalapril (VASOTEC) 10 MG tablet, Take 1 tablet (10 mg total) 2 (two) times daily by mouth., Disp: 180 tablet, Rfl: 3 .  EPINEPHrine (EPIPEN 2-PAK) 0.3 mg/0.3 mL IJ SOAJ injection, USE AS DIRECTED FOR SEVERE  ALLERGIC REACTION., Disp: 2 Device, Rfl: 3 .  Estrogens, Conjugated (PREMARIN VA), Place 1 application vaginally 3 (three) times a week. On Saturdays, Mondays, and Wednesday (cream), Disp: , Rfl:  .  fentaNYL (DURAGESIC - DOSED MCG/HR) 25 MCG/HR patch, Place 1 patch onto the skin every 3 (three) days., Disp: , Rfl: 0 .  FENTANYL HCL TD, Place onto the skin., Disp: , Rfl:  .  fluticasone (FLONASE) 50 MCG/ACT nasal spray, USE 2 SPRAY(S) IN EACH NOSTRIL ONCE DAILY AS NEEDED FOR ALLERGIES OR RHINITIS, Disp: 16 g, Rfl: 0 .  fluticasone (FLOVENT HFA) 44 MCG/ACT inhaler, Inhale 2 puffs into the lungs 2 (two) times daily., Disp: 1 Inhaler, Rfl: 5 .  furosemide (LASIX) 40 MG tablet, Take 40 mg by mouth daily as needed for fluid. , Disp: , Rfl:  .  gabapentin (NEURONTIN) 100 MG capsule, Take 400-700 mg by mouth See admin instructions. Pt takes 400mg  in the morning, 400mg  at lunchtime and 700mg  in the evening, Disp: , Rfl:  .  GLUCOMANNAN PO, Take 700 mg by mouth 2 (two) times daily., Disp: , Rfl:  .  glucose blood (BAYER CONTOUR TEST) test strip, CHECK BLOOD SUGAR 4 TIMES DAILY, Disp: , Rfl:  .  Guaifenesin (MUCINEX MAXIMUM STRENGTH) 1200 MG TB12, Take 1,200 mg by mouth 2 (two) times daily., Disp: , Rfl:  .  hydroxychloroquine (PLAQUENIL) 200 MG tablet, Take 200 mg by mouth 2 (two) times daily., Disp: , Rfl:  .  insulin regular human CONCENTRATED (HUMULIN R) 500 UNIT/ML SOLN injection, Inject 0-100 Units into the skin 4 (four) times daily -  before meals and at bedtime. TID w/ meals Cbgs <125 = no insulin          125-150 = 10 units using insulin syringe ( actual dose is 50 units)          151 - 200 = 15 units using insulin syringe ( actual dose is 75 units)          > 201 = 20 units using insulin syringe ( actual dose is 100 units)  QHS Cbgs <150 = no insulin          151-200= 5 units using insulin syringe ( actual dose is 25 units)          201 - 300 = 10 units using insulin syringe ( actual dose is 50 units)           > 300 = 15 units using insulin syringe ( actual dose is 75 units), Disp: , Rfl:  .  levothyroxine (SYNTHROID, LEVOTHROID) 200 MCG tablet, Take 200 mcg by mouth daily before breakfast. , Disp: , Rfl:  .  metFORMIN (GLUCOPHAGE-XR) 500 MG 24 hr tablet, Take 1,000 mg by mouth 2 (two) times daily., Disp: , Rfl: 0 .  metoprolol tartrate (LOPRESSOR) 25 MG tablet, TAKE ONE TABLET BY MOUTH TWICE DAILY, Disp: 180 tablet, Rfl: 1 .  milk thistle 175 MG tablet, Take 175 mg by mouth 2 (two) times daily. , Disp: , Rfl:  .  ondansetron (ZOFRAN) 4 MG tablet, Take 1 tablet (4 mg total) by mouth every 8 (eight) hours as needed for nausea or vomiting., Disp: 20 tablet, Rfl: 0 .  oxyCODONE (ROXICODONE) 15 MG immediate release tablet, Take 1 tablet by mouth every 4 (four) hours as needed., Disp: , Rfl: 0 .  Oxycodone HCl 10 MG TABS, Take 10 mg by mouth every 4 (four) hours as needed (for pain)., Disp: , Rfl:  .  oxymetazoline (AFRIN) 0.05 % nasal spray, Place 1 spray into both nostrils 2 (two) times daily as needed for congestion., Disp: , Rfl:  .  Polyethyl Glycol-Propyl Glycol (SYSTANE ULTRA OP), Apply 1 drop to eye daily as needed (dry eyes)., Disp: , Rfl:  .  predniSONE (DELTASONE) 20 MG tablet, Take 2 tablets (40 mg total) by mouth daily with breakfast for 7 days., Disp: 14 tablet, Rfl: 0 .  promethazine (PHENERGAN) 25 MG tablet, Take 25 mg by mouth every 6 (six) hours as needed for nausea or vomiting., Disp: , Rfl:  .  saccharomyces boulardii (FLORASTOR) 250 MG capsule, Take 1 capsule (250 mg total) by mouth 2 (two) times daily., Disp: 30 capsule, Rfl: 0 .  silver sulfADIAZINE (SILVADENE) 1 % cream, Apply 1 application topically daily., Disp: 50 g, Rfl: 2 .  vitamin A 29518 UNIT capsule, Take 10,000 Units by mouth daily., Disp: , Rfl:  .  vitamin B-12 (CYANOCOBALAMIN) 1000 MCG tablet, Take 1,000 mcg by mouth daily.  , Disp: , Rfl:  .  Vitamin D, Ergocalciferol, (DRISDOL) 50000 units CAPS capsule, TAKE 1 CAP BY  MOUTH ONCE A month, Disp: 12 capsule, Rfl: 9 .  Vitamins-Lipotropics (LIPO-FLAVONOID PLUS PO), Take 1 tablet by mouth 3 (three) times daily., Disp: , Rfl:  .  azithromycin (ZITHROMAX) 250 MG tablet, Take 2 tabs the first day and then 1 tab daily until you run out., Disp: 6 tablet, Rfl: 0 .  ipratropium-albuterol (DUONEB) 0.5-2.5 (3) MG/3ML SOLN, Take 3 mLs by nebulization every 4 (four) hours as needed (SOB, wheezing)., Disp: 360 mL, Rfl: 1  Current Facility-Administered Medications:  .  ipratropium-albuterol (DUONEB) 0.5-2.5 (3) MG/3ML nebulizer solution 3 mL, 3 mL, Nebulization, Q6H, Batina Dougan, Jilda Roche, DO, 3 mL at 10/05/17 1445  Objective: BP 130/78 (BP Location: Left Arm, Patient Position: Sitting, Cuff Size: Large)   Pulse 99   Temp 98.5 F (36.9 C) (Oral)   Ht 5\' 1"  (1.549 m)   Wt 292 lb (132.5 kg)   SpO2 94%   BMI 55.17 kg/m  General: Awake, appears stated age HEENT: MMM, EOMi Heart: RRR Lungs: Initially with diffuse wheezing and conversational dyspnea; after breathing tx, CTAB, no rales, wheezes or rhonchi. No accessory muscle use Psych: Age appropriate judgment and insight, normal affect and mood  Assessment and Plan: Uncomplicated asthma, unspecified asthma severity, unspecified whether persistent - Plan: albuterol (PROVENTIL HFA) 108 (90 Base) MCG/ACT inhaler, ipratropium-albuterol (DUONEB) 0.5-2.5 (3) MG/3ML nebulizer solution 3 mL, DG Chest 2 View  Screening for breast cancer - Plan: MM DIGITAL  SCREENING BILATERAL  Orders as above. Ck CXR given failure to improve on prednisone. She looks and sounds much better after breathing tx, I do not think she needs immediate/urgent/emergent care. F/u prn.  The patient voiced understanding and agreement to the plan.  Jilda Roche [place], DO 10/05/17  3:30 PM

## 2017-10-05 NOTE — Patient Instructions (Addendum)
Fever of 100.4 F or higher is where I would take the antibiotic. Otherwise, I will let you know via MyChart whether you need to take it.   Call Center for [facility] at [phone] for an appointment.  They are located at [address], [place], [place], [address] (right across the hall from our office).  Let us know if you need anything.

## 2017-10-08 ENCOUNTER — Ambulatory Visit: Payer: Self-pay

## 2017-10-08 NOTE — Telephone Encounter (Signed)
Pt sounded SOB during call and states that she feels "40% better" but still isn't feeling well. I advised Pt to come in to be seen or to get to and ER or Urgent care. Pt refused stating she just needs prednisone. After calming Pt down she stated that Dr.Wendling also Rx ABX that she hadn't began yet. I spoke with Dr.Wendling at that point who advised that Pt begin taking antibiotics at this time. Pt stated she'd comply and if she gets worse she will call the office back in the morning.

## 2017-10-08 NOTE — Telephone Encounter (Signed)
Pt calling with c/o of wheezing, and states that her breathing tx's are only lasting 2 hours. Pt sounds SOB during call. Pt states she has been taking Prednisone and only has 1 more dose left. She is asking if her PCP wants her to take more Prednisone. She states she is feeling better since OV but is not back to where she normally is after taking prednisone. Pt coughing and sounds SOB during call. Pt refusing to go to PCP office or [facility]. NT called PCP office and updated Toya. Call transferred to PCP office.  Reason for Disposition . Wheezing is present  Answer Assessment - Initial Assessment Questions 1. ONSET: "When did the cough begin?"      Week ago Friday 2. SEVERITY: "How bad is the cough today?"      Wheezing, cough, chest congestion 3. RESPIRATORY DISTRESS: "Describe your breathing."      Shortness of breath at rest  4. FEVER: "Do you have a fever?" If so, ask: "What is your temperature, how was it measured, and when did it start?"     99 low grade temp 5. SPUTUM: "Describe the color of your sputum" (clear, white, yellow, green)     yellow 6. HEMOPTYSIS: "Are you coughing up any blood?" If so ask: "How much?" (flecks, streaks, tablespoons, etc.)     no 7. CARDIAC HISTORY: "Do you have any history of heart disease?" (e.g., heart attack, congestive heart failure)      2 stents placed Jan 2017 8. LUNG HISTORY: "Do you have any history of lung disease?"  (e.g., pulmonary embolus, asthma, emphysema)     asthma 9. PE RISK FACTORS: "Do you have a history of blood clots?" (or: recent major surgery, recent prolonged travel, bedridden )     no 10. OTHER SYMPTOMS: "Do you have any other symptoms?" (e.g., runny nose, wheezing, chest pain)       Nose congested, wheezing, has chronic costochondritis. 11. PREGNANCY: "Is there any chance you are pregnant?" "When was your last menstrual period?"       n/a 12. TRAVEL: "Have you traveled out of the country in the last month?" (e.g., travel history,  exposures)       Did not ask  Protocols used: COUGH - ACUTE PRODUCTIVE-A-AH

## 2017-10-10 ENCOUNTER — Ambulatory Visit: Payer: BLUE CROSS/BLUE SHIELD | Admitting: Family Medicine

## 2017-10-10 DIAGNOSIS — Z0289 Encounter for other administrative examinations: Secondary | ICD-10-CM

## 2017-11-01 ENCOUNTER — Ambulatory Visit: Payer: BLUE CROSS/BLUE SHIELD | Admitting: Family Medicine

## 2017-11-01 ENCOUNTER — Other Ambulatory Visit: Payer: Self-pay | Admitting: Family Medicine

## 2017-11-02 ENCOUNTER — Encounter: Payer: Self-pay | Admitting: Family Medicine

## 2017-11-02 ENCOUNTER — Ambulatory Visit (INDEPENDENT_AMBULATORY_CARE_PROVIDER_SITE_OTHER): Payer: BLUE CROSS/BLUE SHIELD | Admitting: Family Medicine

## 2017-11-02 VITALS — BP 132/88 | HR 85 | Temp 98.0°F | Ht 61.0 in | Wt 294.2 lb

## 2017-11-02 DIAGNOSIS — J45909 Unspecified asthma, uncomplicated: Secondary | ICD-10-CM

## 2017-11-02 DIAGNOSIS — K219 Gastro-esophageal reflux disease without esophagitis: Secondary | ICD-10-CM

## 2017-11-02 MED ORDER — FUROSEMIDE 40 MG PO TABS: 40.0000 mg | ORAL_TABLET | Freq: Every day | ORAL | 2 refills | Status: DC | PRN

## 2017-11-02 MED ORDER — FLUTICASONE FUROATE-VILANTEROL 200-25 MCG/INH IN AEPB: 1.0000 | INHALATION_SPRAY | Freq: Every day | RESPIRATORY_TRACT | 2 refills | Status: DC

## 2017-11-02 MED ORDER — LEVOCETIRIZINE DIHYDROCHLORIDE 5 MG PO TABS: 5.0000 mg | ORAL_TABLET | Freq: Every evening | ORAL | 2 refills | Status: DC

## 2017-11-02 MED ORDER — PROMETHAZINE HCL 25 MG PO TABS: 25.0000 mg | ORAL_TABLET | Freq: Three times a day (TID) | ORAL | 0 refills | Status: DC | PRN

## 2017-11-02 NOTE — Patient Instructions (Addendum)
Go back on Prilosec. Take it 30 min before eating and taking your other pills.   Rinse your mouth out after using this new inhaler also. Send me a MyChart message in 10 d if you are doing better, no need to message me if you are doing better.  Let us know if you need anything.

## 2017-11-02 NOTE — Progress Notes (Signed)
Pre visit review using our clinic review tool, if applicable. No additional management support is needed unless otherwise documented below in the visit note. 

## 2017-11-02 NOTE — Progress Notes (Signed)
Chief Complaint  Patient presents with  . Cough  . Wheezing  . Ear Fullness    [name] is 57 y.o. and is here for a cough.  Duration: 1 mo Productive? No Associated symptoms: wheezing, shortness of breath, nasal congestion, itchy/watery eyes Denies: fever, sore throat, facial pain, chest pain, hemoptysis Hx of GERD? Yes Hx of asthma, we have been doing step up therapy, she has been on both Singulair and Theophylline in the past that were not helpful. She is on Zyrtec and Flonase.   ROS:  Resp: +Cough Cardio: No chest pain  Past Medical History:  Diagnosis Date  . Asthma    QVAR daily and Albuterol as needed  . Chronic diastolic CHF (congestive heart failure) (HCC) 09/2015  . Chronic pain syndrome    Fentanyl Patch  . Colon polyps   . Coronary artery disease    cath with normal LM, 30% LAD, 85% mid RCA and 95% distal RCA s/p PCI of the mid to distal RCA and now on DAPT with ASA and Ticagrelor.    . Diabetes mellitus    takes Metformin and Hum R daily  . Eczema   . Excessive daytime sleepiness 11/26/2015  . Fibromyalgia   . Gallstones   . GERD (gastroesophageal reflux disease)   . Hyperlipidemia   . Hypertension    takes Metoprolol and Enalapril daily  . Hypothyroidism    takes Synthroid daily  . IBS (irritable bowel syndrome)   . Mixed connective tissue disease (HCC)    Family History  Problem Relation Age of Onset  . CAD Mother   . Hypertension Mother   . Heart attack Mother   . CAD Father   . Heart attack Father   . Allergic rhinitis Father   . Asthma Father   . Hypertension Brother   . Hypertension Brother   . Pancreatic cancer Paternal Aunt   . Breast cancer Paternal Aunt   . Lung cancer Paternal Aunt    Allergies as of 11/02/2017      Reactions   Fish Allergy Anaphylaxis   INCLUDES OMEGA 3 OILS   Fish Oil Anaphylaxis, Swelling   THROAT SWELLS INCLUDES FISH AS A CLASS   Fish Oil [omega-3 Fatty Acids] Swelling   Throat swelling    Other  Anaphylaxis, Other (See Comments)   UNSPECIFIED REACTION  Perch   Crestor [rosuvastatin] Other (See Comments)   Extreme joint pain, to this & other statins   Gabapentin Anxiety   Anxiety on high doses   Lovastatin Other (See Comments)   EXTREME JOINT PAIN   Monascus Purpureus Went Yeast Other (See Comments)   Muscle pain and severe joint pain.    Red Yeast Rice [cholestin] Other (See Comments)   Muscle pain and severe joint pain.    Metronidazole Nausea Only, Swelling   Nausea and vomiting   Neupro [rotigotine]    Extreme edema   Shellfish Allergy Other (See Comments)   UNSPECIFIED REACTION    Suprep [na Sulfate-k Sulfate-mg Sulf] Nausea And Vomiting   Tape Other (See Comments)   Electrodes causes skin breakdown      Medication List        Accurate as of 11/02/17  4:54 PM. Always use your most recent med list.          albuterol 108 (90 Base) MCG/ACT inhaler Commonly known as:  PROVENTIL HFA INHALE 2 PUFFS BY MOUTH EVERY 4 HOURS AS NEEDED FOR WHEEZING AND FOR SHORTNESS OF BREATH  aspirin EC 81 MG tablet Take 81 mg by mouth daily.   b complex vitamins tablet Take 1 tablet by mouth daily.   BAYER CONTOUR TEST test strip Generic drug:  glucose blood CHECK BLOOD SUGAR 4 TIMES DAILY   betamethasone dipropionate 0.05 % cream Commonly known as:  DIPROLENE Apply 1 application topically daily as needed (eczema).   BRILINTA 90 MG Tabs tablet Generic drug:  ticagrelor TAKE ONE TABLET BY MOUTH TWICE DAILY   clotrimazole-betamethasone cream Commonly known as:  LOTRISONE Apply 1 application topically daily as needed (eczema).   diphenhydrAMINE 25 MG tablet Commonly known as:  BENADRYL Take 1 tablet (25 mg total) by mouth every 6 (six) hours as needed for itching or allergies.   enalapril 10 MG tablet Commonly known as:  VASOTEC Take 1 tablet (10 mg total) 2 (two) times daily by mouth.   EPINEPHrine 0.3 mg/0.3 mL Soaj injection Commonly known as:  EPIPEN 2-PAK USE  AS DIRECTED FOR SEVERE ALLERGIC REACTION.   fentaNYL 25 MCG/HR patch Commonly known as:  DURAGESIC - dosed mcg/hr Place 1 patch onto the skin every 3 (three) days.   FENTANYL HCL TD Place onto the skin.   fluticasone 50 MCG/ACT nasal spray Commonly known as:  FLONASE USE 2 SPRAY(S) IN EACH NOSTRIL ONCE DAILY AS NEEDED FOR ALLERGIES OR RHINITIS   fluticasone furoate-vilanterol 200-25 MCG/INH Aepb Commonly known as:  BREO ELLIPTA Inhale 1 puff into the lungs daily.   furosemide 40 MG tablet Commonly known as:  LASIX Take 1 tablet (40 mg total) by mouth daily as needed for fluid.   gabapentin 100 MG capsule Commonly known as:  NEURONTIN Take 400-700 mg by mouth See admin instructions. Pt takes 400mg  in the morning, 400mg  at lunchtime and 700mg  in the evening   GLUCOMANNAN PO Take 700 mg by mouth 2 (two) times daily.   HUMULIN R 500 UNIT/ML injection Generic drug:  insulin regular human CONCENTRATED Inject 0-100 Units into the skin 4 (four) times daily -  before meals and at bedtime. TID w/ meals Cbgs <125 = no insulin          125-150 = 10 units using insulin syringe ( actual dose is 50 units)          151 - 200 = 15 units using insulin syringe ( actual dose is 75 units)          > 201 = 20 units using insulin syringe ( actual dose is 100 units)  QHS Cbgs <150 = no insulin          151-200= 5 units using insulin syringe ( actual dose is 25 units)          201 - 300 = 10 units using insulin syringe ( actual dose is 50 units)          > 300 = 15 units using insulin syringe ( actual dose is 75 units)   hydroxychloroquine 200 MG tablet Commonly known as:  PLAQUENIL Take 200 mg by mouth 2 (two) times daily.   ipratropium-albuterol 0.5-2.5 (3) MG/3ML Soln Commonly known as:  DUONEB Take 3 mLs by nebulization every 4 (four) hours as needed (SOB, wheezing).   levocetirizine 5 MG tablet Commonly known as:  XYZAL Take 1 tablet (5 mg total) by mouth every evening.     levothyroxine 200 MCG tablet Commonly known as:  SYNTHROID, LEVOTHROID Take 200 mcg by mouth daily before breakfast.   LIPO-FLAVONOID PLUS PO Take 1 tablet by mouth  3 (three) times daily.   metFORMIN 500 MG 24 hr tablet Commonly known as:  GLUCOPHAGE-XR Take 1,000 mg by mouth 2 (two) times daily.   metoprolol tartrate 25 MG tablet Commonly known as:  LOPRESSOR TAKE ONE TABLET BY MOUTH TWICE DAILY   milk thistle 175 MG tablet Take 175 mg by mouth 2 (two) times daily.   MUCINEX MAXIMUM STRENGTH 1200 MG Tb12 Generic drug:  Guaifenesin Take 1,200 mg by mouth 2 (two) times daily.   ondansetron 4 MG tablet Commonly known as:  ZOFRAN Take 1 tablet (4 mg total) by mouth every 8 (eight) hours as needed for nausea or vomiting.   oxyCODONE 15 MG immediate release tablet Commonly known as:  ROXICODONE Take 1 tablet by mouth every 4 (four) hours as needed.   Oxycodone HCl 10 MG Tabs Take 10 mg by mouth every 4 (four) hours as needed (for pain).   oxymetazoline 0.05 % nasal spray Commonly known as:  AFRIN Place 1 spray into both nostrils 2 (two) times daily as needed for congestion.   PREMARIN VA Place 1 application vaginally 3 (three) times a week. On Saturdays, Mondays, and Wednesday (cream)   promethazine 25 MG tablet Commonly known as:  PHENERGAN Take 1 tablet (25 mg total) by mouth every 8 (eight) hours as needed for nausea or vomiting.   saccharomyces boulardii 250 MG capsule Commonly known as:  FLORASTOR Take 1 capsule (250 mg total) by mouth 2 (two) times daily.   silver sulfADIAZINE 1 % cream Commonly known as:  SILVADENE Apply 1 application topically daily.   SYSTANE ULTRA OP Apply 1 drop to eye daily as needed (dry eyes).   vitamin A 19379 UNIT capsule Take 10,000 Units by mouth daily.   vitamin B-12 1000 MCG tablet Commonly known as:  CYANOCOBALAMIN Take 1,000 mcg by mouth daily.   Vitamin D (Ergocalciferol) 50000 units Caps capsule Commonly known as:   DRISDOL TAKE 1 CAP BY MOUTH ONCE A month       BP 132/88 (BP Location: Left Arm, Patient Position: Sitting, Cuff Size: Normal)   Pulse 85   Temp 98 F (36.7 C) (Oral)   Ht 5\' 1"  (1.549 m)   Wt 294 lb 4 oz (133.5 kg)   SpO2 96%   BMI 55.60 kg/m  Gen: Awake, alert, appears stated age HEENT: Ears neg, nares patent without D/C, turbinates unremarkable, Pharynx pink without exudate Neck: Supple, no masses or asymmetry, no tenderness Heart: RRR, no bruits, no LE edema Lungs: CTAB, normal effort, no accessory muscle use Psych: Age appropriate judgement and insight, normal mood and affect  Uncomplicated asthma, unspecified asthma severity, unspecified whether persistent - Plan: levocetirizine (XYZAL) 5 MG tablet, fluticasone furoate-vilanterol (BREO ELLIPTA) 200-25 MCG/INH AEPB  Gastroesophageal reflux disease, esophagitis presence not specified  Orders as above. Stop Flovent, start Breo. Change Zyrtec to Xyzal. Go back on PPI.  Send [facility] message in 10 days, if no improvement will refer to pulm.  The patient voiced understanding and agreement to the plan.  [place], DO 11/02/17 4:54 PM

## 2017-11-27 ENCOUNTER — Other Ambulatory Visit: Payer: Self-pay | Admitting: Family Medicine

## 2017-12-27 ENCOUNTER — Other Ambulatory Visit: Payer: Self-pay

## 2017-12-27 ENCOUNTER — Other Ambulatory Visit: Payer: Self-pay | Admitting: Cardiology

## 2017-12-27 ENCOUNTER — Other Ambulatory Visit: Payer: Self-pay | Admitting: Family Medicine

## 2017-12-27 MED ORDER — ALBUTEROL SULFATE HFA 108 (90 BASE) MCG/ACT IN AERS: 2.0000 | INHALATION_SPRAY | RESPIRATORY_TRACT | 5 refills | Status: DC | PRN

## 2018-01-02 ENCOUNTER — Ambulatory Visit: Payer: BLUE CROSS/BLUE SHIELD | Admitting: Family Medicine

## 2018-01-02 ENCOUNTER — Emergency Department (HOSPITAL_BASED_OUTPATIENT_CLINIC_OR_DEPARTMENT_OTHER): Payer: BLUE CROSS/BLUE SHIELD

## 2018-01-02 ENCOUNTER — Encounter (HOSPITAL_BASED_OUTPATIENT_CLINIC_OR_DEPARTMENT_OTHER): Payer: Self-pay | Admitting: *Deleted

## 2018-01-02 ENCOUNTER — Other Ambulatory Visit: Payer: Self-pay

## 2018-01-02 ENCOUNTER — Inpatient Hospital Stay (HOSPITAL_BASED_OUTPATIENT_CLINIC_OR_DEPARTMENT_OTHER)
Admission: EM | Admit: 2018-01-02 | Discharge: 2018-01-06 | DRG: 603 | Disposition: A | Discharge status: Home / Self Care | Payer: BLUE CROSS/BLUE SHIELD | Attending: Internal Medicine | Admitting: Internal Medicine

## 2018-01-02 DIAGNOSIS — Z881 Allergy status to other antibiotic agents status: Secondary | ICD-10-CM

## 2018-01-02 DIAGNOSIS — K589 Irritable bowel syndrome without diarrhea: Secondary | ICD-10-CM | POA: Diagnosis present

## 2018-01-02 DIAGNOSIS — Z91013 Allergy to seafood: Secondary | ICD-10-CM

## 2018-01-02 DIAGNOSIS — E1142 Type 2 diabetes mellitus with diabetic polyneuropathy: Secondary | ICD-10-CM | POA: Diagnosis present

## 2018-01-02 DIAGNOSIS — L03119 Cellulitis of unspecified part of limb: Secondary | ICD-10-CM | POA: Diagnosis present

## 2018-01-02 DIAGNOSIS — I5032 Chronic diastolic (congestive) heart failure: Secondary | ICD-10-CM | POA: Diagnosis present

## 2018-01-02 DIAGNOSIS — L0390 Cellulitis, unspecified: Secondary | ICD-10-CM | POA: Diagnosis not present

## 2018-01-02 DIAGNOSIS — I110 Hypertensive heart disease with heart failure: Secondary | ICD-10-CM | POA: Diagnosis present

## 2018-01-02 DIAGNOSIS — Z6841 Body Mass Index (BMI) 40.0 and over, adult: Secondary | ICD-10-CM | POA: Diagnosis not present

## 2018-01-02 DIAGNOSIS — J9811 Atelectasis: Secondary | ICD-10-CM | POA: Diagnosis present

## 2018-01-02 DIAGNOSIS — I10 Essential (primary) hypertension: Secondary | ICD-10-CM

## 2018-01-02 DIAGNOSIS — M358 Other specified systemic involvement of connective tissue: Secondary | ICD-10-CM | POA: Diagnosis not present

## 2018-01-02 DIAGNOSIS — E785 Hyperlipidemia, unspecified: Secondary | ICD-10-CM | POA: Diagnosis present

## 2018-01-02 DIAGNOSIS — R7989 Other specified abnormal findings of blood chemistry: Secondary | ICD-10-CM | POA: Diagnosis present

## 2018-01-02 DIAGNOSIS — J4530 Mild persistent asthma, uncomplicated: Secondary | ICD-10-CM | POA: Diagnosis not present

## 2018-01-02 DIAGNOSIS — J45909 Unspecified asthma, uncomplicated: Secondary | ICD-10-CM | POA: Diagnosis present

## 2018-01-02 DIAGNOSIS — Z7902 Long term (current) use of antithrombotics/antiplatelets: Secondary | ICD-10-CM | POA: Diagnosis not present

## 2018-01-02 DIAGNOSIS — G894 Chronic pain syndrome: Secondary | ICD-10-CM | POA: Diagnosis present

## 2018-01-02 DIAGNOSIS — E1165 Type 2 diabetes mellitus with hyperglycemia: Secondary | ICD-10-CM | POA: Diagnosis present

## 2018-01-02 DIAGNOSIS — L03116 Cellulitis of left lower limb: Secondary | ICD-10-CM | POA: Diagnosis present

## 2018-01-02 DIAGNOSIS — K219 Gastro-esophageal reflux disease without esophagitis: Secondary | ICD-10-CM | POA: Diagnosis present

## 2018-01-02 DIAGNOSIS — E039 Hypothyroidism, unspecified: Secondary | ICD-10-CM | POA: Diagnosis present

## 2018-01-02 DIAGNOSIS — Z7951 Long term (current) use of inhaled steroids: Secondary | ICD-10-CM

## 2018-01-02 DIAGNOSIS — Z91018 Allergy to other foods: Secondary | ICD-10-CM

## 2018-01-02 DIAGNOSIS — I2510 Atherosclerotic heart disease of native coronary artery without angina pectoris: Secondary | ICD-10-CM | POA: Diagnosis present

## 2018-01-02 DIAGNOSIS — IMO0001 Reserved for inherently not codable concepts without codable children: Secondary | ICD-10-CM

## 2018-01-02 DIAGNOSIS — E871 Hypo-osmolality and hyponatremia: Secondary | ICD-10-CM | POA: Diagnosis present

## 2018-01-02 DIAGNOSIS — Z87891 Personal history of nicotine dependence: Secondary | ICD-10-CM

## 2018-01-02 DIAGNOSIS — M351 Other overlap syndromes: Secondary | ICD-10-CM | POA: Diagnosis present

## 2018-01-02 DIAGNOSIS — Z79891 Long term (current) use of opiate analgesic: Secondary | ICD-10-CM

## 2018-01-02 DIAGNOSIS — E878 Other disorders of electrolyte and fluid balance, not elsewhere classified: Secondary | ICD-10-CM | POA: Diagnosis present

## 2018-01-02 DIAGNOSIS — Z794 Long term (current) use of insulin: Secondary | ICD-10-CM

## 2018-01-02 DIAGNOSIS — M797 Fibromyalgia: Secondary | ICD-10-CM | POA: Diagnosis present

## 2018-01-02 DIAGNOSIS — Z79890 Hormone replacement therapy (postmenopausal): Secondary | ICD-10-CM

## 2018-01-02 DIAGNOSIS — Z7982 Long term (current) use of aspirin: Secondary | ICD-10-CM

## 2018-01-02 DIAGNOSIS — Z91048 Other nonmedicinal substance allergy status: Secondary | ICD-10-CM

## 2018-01-02 DIAGNOSIS — Z86010 Personal history of colonic polyps: Secondary | ICD-10-CM

## 2018-01-02 DIAGNOSIS — Z0289 Encounter for other administrative examinations: Secondary | ICD-10-CM

## 2018-01-02 DIAGNOSIS — F4542 Pain disorder with related psychological factors: Secondary | ICD-10-CM | POA: Diagnosis present

## 2018-01-02 DIAGNOSIS — Z888 Allergy status to other drugs, medicaments and biological substances status: Secondary | ICD-10-CM

## 2018-01-02 DIAGNOSIS — E872 Acidosis: Secondary | ICD-10-CM | POA: Diagnosis present

## 2018-01-02 DIAGNOSIS — N289 Disorder of kidney and ureter, unspecified: Secondary | ICD-10-CM | POA: Diagnosis present

## 2018-01-02 DIAGNOSIS — Z79899 Other long term (current) drug therapy: Secondary | ICD-10-CM

## 2018-01-02 DIAGNOSIS — E119 Type 2 diabetes mellitus without complications: Secondary | ICD-10-CM | POA: Diagnosis not present

## 2018-01-02 DIAGNOSIS — R0602 Shortness of breath: Secondary | ICD-10-CM

## 2018-01-02 DIAGNOSIS — Z955 Presence of coronary angioplasty implant and graft: Secondary | ICD-10-CM

## 2018-01-02 LAB — COMPREHENSIVE METABOLIC PANEL
ALT: 45 U/L (ref 14–54)
AST: 41 U/L (ref 15–41)
Albumin: 3.6 g/dL (ref 3.5–5.0)
Alkaline Phosphatase: 62 U/L (ref 38–126)
Anion gap: 10 (ref 5–15)
BUN: 17 mg/dL (ref 6–20)
CO2: 25 mmol/L (ref 22–32)
Calcium: 8.8 mg/dL — ABNORMAL LOW (ref 8.9–10.3)
Chloride: 98 mmol/L — ABNORMAL LOW (ref 101–111)
Creatinine, Ser: 1.01 mg/dL — ABNORMAL HIGH (ref 0.44–1.00)
GFR calc Af Amer: >60 mL/min (ref >60)
GFR calc non Af Amer: >60 mL/min (ref >60)
Glucose, Bld: 257 mg/dL — ABNORMAL HIGH (ref 65–99)
Potassium: 3.9 mmol/L (ref 3.5–5.1)
Sodium: 133 mmol/L — ABNORMAL LOW (ref 135–145)
Total Bilirubin: 0.5 mg/dL (ref 0.3–1.2)
Total Protein: 6.9 g/dL (ref 6.5–8.1)

## 2018-01-02 LAB — HEMOGLOBIN A1C
Hgb A1c MFr Bld: 8.4 % — ABNORMAL HIGH (ref 4.8–5.6)
Mean Plasma Glucose: 194.38 mg/dL

## 2018-01-02 LAB — URINALYSIS, COMPLETE (UACMP) WITH MICROSCOPIC
Bacteria, UA: NONE SEEN
Bilirubin Urine: NEGATIVE
Glucose, UA: 50 mg/dL — AB
Hgb urine dipstick: NEGATIVE
Ketones, ur: NEGATIVE mg/dL
Leukocytes, UA: NEGATIVE
Nitrite: NEGATIVE
Protein, ur: NEGATIVE mg/dL
Specific Gravity, Urine: 1.018 (ref 1.005–1.030)
pH: 5 (ref 5.0–8.0)

## 2018-01-02 LAB — CBC WITH DIFFERENTIAL/PLATELET
Basophils Absolute: 0 10*3/uL (ref 0.0–0.1)
Basophils Relative: 0 %
Eosinophils Absolute: 0.2 10*3/uL (ref 0.0–0.7)
Eosinophils Relative: 2 %
HCT: 41.4 % (ref 36.0–46.0)
Hemoglobin: 13.5 g/dL (ref 12.0–15.0)
Lymphocytes Relative: 18 %
Lymphs Abs: 1.9 10*3/uL (ref 0.7–4.0)
MCH: 29.6 pg (ref 26.0–34.0)
MCHC: 32.6 g/dL (ref 30.0–36.0)
MCV: 90.8 fL (ref 78.0–100.0)
Monocytes Absolute: 0.9 10*3/uL (ref 0.1–1.0)
Monocytes Relative: 9 %
Neutro Abs: 7.2 10*3/uL (ref 1.7–7.7)
Neutrophils Relative %: 71 %
Platelets: 157 10*3/uL (ref 150–400)
RBC: 4.56 MIL/uL (ref 3.87–5.11)
RDW: 14.4 % (ref 11.5–15.5)
WBC: 10.2 10*3/uL (ref 4.0–10.5)

## 2018-01-02 LAB — CBG MONITORING, ED: Glucose-Capillary: 171 mg/dL — ABNORMAL HIGH (ref 65–99)

## 2018-01-02 LAB — TSH: TSH: 1.388 u[IU]/mL (ref 0.350–4.500)

## 2018-01-02 LAB — SEDIMENTATION RATE: Sed Rate: 37 mm/h — ABNORMAL HIGH (ref 0–22)

## 2018-01-02 LAB — MRSA PCR SCREENING: MRSA by PCR: NEGATIVE

## 2018-01-02 LAB — I-STAT CG4 LACTIC ACID, ED: Lactic Acid, Venous: 2.2 mmol/L (ref 0.5–1.9)

## 2018-01-02 LAB — LACTIC ACID, PLASMA
Lactic Acid, Venous: 2.2 mmol/L (ref 0.5–1.9)
Lactic Acid, Venous: 4 mmol/L (ref 0.5–1.9)
Lactic Acid, Venous: 1.9 mmol/L (ref 0.5–1.9)

## 2018-01-02 LAB — C-REACTIVE PROTEIN: CRP: 9.8 mg/dL — ABNORMAL HIGH

## 2018-01-02 LAB — GLUCOSE, CAPILLARY: Glucose-Capillary: 267 mg/dL — ABNORMAL HIGH (ref 65–99)

## 2018-01-02 LAB — BRAIN NATRIURETIC PEPTIDE: B Natriuretic Peptide: 21.3 pg/mL (ref 0.0–100.0)

## 2018-01-02 MED ORDER — GABAPENTIN 400 MG PO CAPS: 400.0000 mg | ORAL_CAPSULE | ORAL | Status: DC

## 2018-01-02 MED ORDER — SODIUM CHLORIDE 0.9 % IV SOLN
INTRAVENOUS | Status: DC
  Administered 2018-01-02 – 2018-01-03 (×2): via INTRAVENOUS

## 2018-01-02 MED ORDER — SACCHAROMYCES BOULARDII 250 MG PO CAPS
250.0000 mg | ORAL_CAPSULE | Freq: Two times a day (BID) | ORAL | Status: DC
  Administered 2018-01-02 – 2018-01-06 (×8): 250 mg via ORAL
  Filled 2018-01-02 (×8): qty 1

## 2018-01-02 MED ORDER — ONDANSETRON HCL 4 MG PO TABS: 4.0000 mg | ORAL_TABLET | Freq: Three times a day (TID) | ORAL | Status: DC | PRN

## 2018-01-02 MED ORDER — OXYMETAZOLINE HCL 0.05 % NA SOLN: 1.0000 | Freq: Two times a day (BID) | NASAL | Status: DC | PRN

## 2018-01-02 MED ORDER — POLYETHYL GLYCOL-PROPYL GLYCOL 0.4-0.3 % OP SOLN: Freq: Every day | OPHTHALMIC | Status: DC | PRN

## 2018-01-02 MED ORDER — SILVER SULFADIAZINE 1 % EX CREA
TOPICAL_CREAM | Freq: Two times a day (BID) | CUTANEOUS | Status: DC
  Administered 2018-01-02 – 2018-01-05 (×6): via TOPICAL
  Administered 2018-01-05: 1 via TOPICAL
  Administered 2018-01-06: 09:00:00 via TOPICAL
  Filled 2018-01-02: qty 50

## 2018-01-02 MED ORDER — OXYCODONE HCL ER 15 MG PO T12A
15.0000 mg | EXTENDED_RELEASE_TABLET | Freq: Two times a day (BID) | ORAL | Status: DC
  Administered 2018-01-02 – 2018-01-04 (×4): 15 mg via ORAL
  Filled 2018-01-02 (×4): qty 1

## 2018-01-02 MED ORDER — INSULIN ASPART 100 UNIT/ML ~~LOC~~ SOLN
0.0000 [IU] | Freq: Three times a day (TID) | SUBCUTANEOUS | Status: DC
  Administered 2018-01-03: 3 [IU] via SUBCUTANEOUS

## 2018-01-02 MED ORDER — VANCOMYCIN HCL IN DEXTROSE 1-5 GM/200ML-% IV SOLN
1000.0000 mg | Freq: Once | INTRAVENOUS | Status: AC
  Administered 2018-01-02: 1000 mg via INTRAVENOUS
  Filled 2018-01-02: qty 200

## 2018-01-02 MED ORDER — CEFAZOLIN SODIUM-DEXTROSE 2-4 GM/100ML-% IV SOLN
2.0000 g | Freq: Three times a day (TID) | INTRAVENOUS | Status: DC
  Filled 2018-01-02: qty 100

## 2018-01-02 MED ORDER — ASPIRIN EC 81 MG PO TBEC
81.0000 mg | DELAYED_RELEASE_TABLET | Freq: Every day | ORAL | Status: DC
  Administered 2018-01-03 – 2018-01-06 (×4): 81 mg via ORAL
  Filled 2018-01-02 (×4): qty 1

## 2018-01-02 MED ORDER — CEFAZOLIN SODIUM 1 G IJ SOLR
INTRAMUSCULAR | Status: AC
  Filled 2018-01-02: qty 10

## 2018-01-02 MED ORDER — LEVOTHYROXINE SODIUM 100 MCG PO TABS
100.0000 ug | ORAL_TABLET | ORAL | Status: DC
  Filled 2018-01-02: qty 1

## 2018-01-02 MED ORDER — POLYVINYL ALCOHOL 1.4 % OP SOLN: 1.0000 [drp] | OPHTHALMIC | Status: DC | PRN

## 2018-01-02 MED ORDER — SODIUM CHLORIDE 0.9 % IV BOLUS
500.0000 mL | Freq: Once | INTRAVENOUS | Status: AC
  Administered 2018-01-02: 500 mL via INTRAVENOUS

## 2018-01-02 MED ORDER — METOPROLOL TARTRATE 25 MG PO TABS
25.0000 mg | ORAL_TABLET | Freq: Two times a day (BID) | ORAL | Status: DC
  Administered 2018-01-02 – 2018-01-06 (×8): 25 mg via ORAL
  Filled 2018-01-02 (×8): qty 1

## 2018-01-02 MED ORDER — LEVOCETIRIZINE DIHYDROCHLORIDE 5 MG PO TABS: 5.0000 mg | ORAL_TABLET | Freq: Every evening | ORAL | Status: DC

## 2018-01-02 MED ORDER — NON FORMULARY: 2.0000 mg | Freq: Every day | Status: DC

## 2018-01-02 MED ORDER — IPRATROPIUM-ALBUTEROL 0.5-2.5 (3) MG/3ML IN SOLN
3.0000 mL | Freq: Three times a day (TID) | RESPIRATORY_TRACT | Status: DC
  Administered 2018-01-03 – 2018-01-06 (×9): 3 mL via RESPIRATORY_TRACT
  Filled 2018-01-02 (×10): qty 3

## 2018-01-02 MED ORDER — OXYCODONE ER 18 MG PO C12A: 18.0000 mg | EXTENDED_RELEASE_CAPSULE | Freq: Two times a day (BID) | ORAL | Status: DC

## 2018-01-02 MED ORDER — SODIUM CHLORIDE 0.9 % IV BOLUS
1000.0000 mL | Freq: Once | INTRAVENOUS | Status: AC
  Administered 2018-01-02: 1000 mL via INTRAVENOUS

## 2018-01-02 MED ORDER — DIPHENHYDRAMINE HCL 25 MG PO CAPS: 25.0000 mg | ORAL_CAPSULE | Freq: Four times a day (QID) | ORAL | Status: DC | PRN

## 2018-01-02 MED ORDER — ONDANSETRON HCL 4 MG PO TABS: 4.0000 mg | ORAL_TABLET | Freq: Four times a day (QID) | ORAL | Status: DC | PRN

## 2018-01-02 MED ORDER — INSULIN REGULAR HUMAN (CONC) 500 UNIT/ML ~~LOC~~ SOLN
0.0000 [IU] | Freq: Three times a day (TID) | SUBCUTANEOUS | Status: DC
Start: 2018-01-02 — End: 2018-01-02

## 2018-01-02 MED ORDER — HEPARIN SODIUM (PORCINE) 5000 UNIT/ML IJ SOLN
5000.0000 [IU] | Freq: Three times a day (TID) | INTRAMUSCULAR | Status: DC
  Administered 2018-01-02 – 2018-01-06 (×12): 5000 [IU] via SUBCUTANEOUS
  Filled 2018-01-02 (×12): qty 1

## 2018-01-02 MED ORDER — ACETAMINOPHEN 325 MG PO TABS
650.0000 mg | ORAL_TABLET | Freq: Four times a day (QID) | ORAL | Status: DC | PRN
  Administered 2018-01-04 – 2018-01-06 (×3): 650 mg via ORAL
  Filled 2018-01-02 (×3): qty 2

## 2018-01-02 MED ORDER — LEVOTHYROXINE SODIUM 100 MCG PO TABS
200.0000 ug | ORAL_TABLET | Freq: Every day | ORAL | Status: DC
Start: 2018-01-03 — End: 2018-01-02

## 2018-01-02 MED ORDER — PROMETHAZINE HCL 25 MG PO TABS
25.0000 mg | ORAL_TABLET | Freq: Three times a day (TID) | ORAL | Status: DC | PRN
  Administered 2018-01-05: 25 mg via ORAL
  Filled 2018-01-02: qty 1

## 2018-01-02 MED ORDER — IPRATROPIUM-ALBUTEROL 0.5-2.5 (3) MG/3ML IN SOLN
3.0000 mL | Freq: Four times a day (QID) | RESPIRATORY_TRACT | Status: DC
  Administered 2018-01-02: 3 mL via RESPIRATORY_TRACT
  Filled 2018-01-02: qty 3

## 2018-01-02 MED ORDER — FLUTICASONE FUROATE-VILANTEROL 200-25 MCG/INH IN AEPB
1.0000 | INHALATION_SPRAY | Freq: Every day | RESPIRATORY_TRACT | Status: DC
  Administered 2018-01-03 – 2018-01-06 (×4): 1 via RESPIRATORY_TRACT
  Filled 2018-01-02: qty 28

## 2018-01-02 MED ORDER — CEFAZOLIN SODIUM-DEXTROSE 2-4 GM/100ML-% IV SOLN
2.0000 g | Freq: Three times a day (TID) | INTRAVENOUS | Status: DC
Start: 2018-01-02 — End: 2018-01-06
  Administered 2018-01-02 – 2018-01-06 (×12): 2 g via INTRAVENOUS
  Filled 2018-01-02 (×13): qty 100

## 2018-01-02 MED ORDER — ACETAMINOPHEN 650 MG RE SUPP: 650.0000 mg | Freq: Four times a day (QID) | RECTAL | Status: DC | PRN

## 2018-01-02 MED ORDER — VITAMIN B-6 100 MG PO TABS: 100.0000 mg | ORAL_TABLET | Freq: Two times a day (BID) | ORAL | Status: DC

## 2018-01-02 MED ORDER — CEFAZOLIN SODIUM-DEXTROSE 1-4 GM/50ML-% IV SOLN
1.0000 g | Freq: Once | INTRAVENOUS | Status: AC
  Administered 2018-01-02: 1 g via INTRAVENOUS
  Filled 2018-01-02: qty 50

## 2018-01-02 MED ORDER — CEFAZOLIN SODIUM-DEXTROSE 1-4 GM-%(50ML) IV SOLR
2.0000 g | Freq: Three times a day (TID) | INTRAVENOUS | Status: DC
  Administered 2018-01-02: 2 g via INTRAVENOUS
  Filled 2018-01-02 (×3): qty 100

## 2018-01-02 MED ORDER — LEVOTHYROXINE SODIUM 100 MCG PO TABS
200.0000 ug | ORAL_TABLET | ORAL | Status: DC
  Administered 2018-01-03 – 2018-01-06 (×4): 200 ug via ORAL
  Filled 2018-01-02 (×4): qty 2

## 2018-01-02 MED ORDER — VITAMIN B-6 100 MG PO TABS
100.0000 mg | ORAL_TABLET | Freq: Two times a day (BID) | ORAL | Status: DC
  Administered 2018-01-02 – 2018-01-06 (×8): 100 mg via ORAL
  Filled 2018-01-02 (×9): qty 1

## 2018-01-02 MED ORDER — FLUTICASONE PROPIONATE 50 MCG/ACT NA SUSP
2.0000 | Freq: Every day | NASAL | Status: DC
  Administered 2018-01-02 – 2018-01-05 (×4): 2 via NASAL
  Filled 2018-01-02: qty 16

## 2018-01-02 MED ORDER — IPRATROPIUM-ALBUTEROL 0.5-2.5 (3) MG/3ML IN SOLN
3.0000 mL | RESPIRATORY_TRACT | Status: DC | PRN
  Administered 2018-01-03 – 2018-01-04 (×2): 3 mL via RESPIRATORY_TRACT
  Filled 2018-01-02 (×2): qty 3

## 2018-01-02 MED ORDER — ONDANSETRON HCL 4 MG/2ML IJ SOLN
4.0000 mg | Freq: Once | INTRAMUSCULAR | Status: AC
  Administered 2018-01-02: 4 mg via INTRAVENOUS
  Filled 2018-01-02: qty 2

## 2018-01-02 MED ORDER — INSULIN ASPART 100 UNIT/ML ~~LOC~~ SOLN
0.0000 [IU] | Freq: Every day | SUBCUTANEOUS | Status: DC
  Administered 2018-01-02: 3 [IU] via SUBCUTANEOUS

## 2018-01-02 MED ORDER — LORATADINE 10 MG PO TABS
10.0000 mg | ORAL_TABLET | Freq: Every day | ORAL | Status: DC
  Administered 2018-01-02 – 2018-01-06 (×5): 10 mg via ORAL
  Filled 2018-01-02 (×5): qty 1

## 2018-01-02 MED ORDER — ONDANSETRON HCL 4 MG/2ML IJ SOLN
4.0000 mg | Freq: Four times a day (QID) | INTRAMUSCULAR | Status: DC | PRN
Start: 2018-01-02 — End: 2018-01-06
  Administered 2018-01-04 – 2018-01-05 (×2): 4 mg via INTRAVENOUS
  Filled 2018-01-02 (×2): qty 2

## 2018-01-02 MED ORDER — TICAGRELOR 90 MG PO TABS
90.0000 mg | ORAL_TABLET | Freq: Two times a day (BID) | ORAL | Status: DC
  Administered 2018-01-02 – 2018-01-06 (×8): 90 mg via ORAL
  Filled 2018-01-02 (×9): qty 1

## 2018-01-02 MED ORDER — GABAPENTIN 400 MG PO CAPS
700.0000 mg | ORAL_CAPSULE | Freq: Every day | ORAL | Status: DC
  Administered 2018-01-02 – 2018-01-05 (×4): 700 mg via ORAL
  Filled 2018-01-02 (×4): qty 1

## 2018-01-02 MED ORDER — GABAPENTIN 400 MG PO CAPS
400.0000 mg | ORAL_CAPSULE | Freq: Two times a day (BID) | ORAL | Status: DC
  Administered 2018-01-03 – 2018-01-06 (×8): 400 mg via ORAL
  Filled 2018-01-02 (×8): qty 1

## 2018-01-02 MED ORDER — VANCOMYCIN HCL IN DEXTROSE 1-5 GM/200ML-% IV SOLN
1000.0000 mg | Freq: Two times a day (BID) | INTRAVENOUS | Status: DC
  Administered 2018-01-02 – 2018-01-04 (×4): 1000 mg via INTRAVENOUS
  Filled 2018-01-02 (×3): qty 200

## 2018-01-02 NOTE — ED Notes (Signed)
Critical Lactic result delivered to Dr Read Drivers.

## 2018-01-02 NOTE — Progress Notes (Signed)
Pharmacy Antibiotic Note  [name] is a 57 y.o. female admitted on 01/02/2018 with cellulitis.  Pharmacy has been consulted for cefazolin and vancomycin dosing.   Received 1 G cefazolin and 1 G of vancomycin at [facility] ED  Plan: -vancomycin 1G every 12 hours -goal trough 15-20 mcg/mL  -cefazolin 2G every 8 hours  -monitor clinical progression, length of therapy, renal function, and vancomycin trough as needed   Height: 5\' 1"  (154.9 cm) Weight: 290 lb (131.5 kg) IBW/kg (Calculated) : 47.8  Temp (24hrs), Avg:98.4 F (36.9 C), Min:98.1 F (36.7 C), Max:98.7 F (37.1 C)  Recent Labs  Lab 01/02/18 0243 01/02/18 0251  WBC 10.2  --   CREATININE 1.01*  --   LATICACIDVEN  --  2.20*    Estimated Creatinine Clearance: 79.8 mL/min (A) (by C-G formula based on SCr of 1.01 mg/dL (H)).    Allergies  Allergen Reactions  . Fish Allergy Anaphylaxis    INCLUDES OMEGA 3 OILS  . Fish Oil Anaphylaxis and Swelling    THROAT SWELLS INCLUDES FISH AS A CLASS  . Fish Oil [Omega-3 Fatty Acids] Swelling    Throat swelling   . Other Anaphylaxis and Other (See Comments)    UNSPECIFIED REACTION  Perch  . Crestor [Rosuvastatin] Other (See Comments)    Extreme joint pain, to this & other statins  . Gabapentin Anxiety    Anxiety on high doses  . Lovastatin Other (See Comments)    EXTREME JOINT PAIN  . Monascus Purpureus Went Yeast Other (See Comments)    Muscle pain and severe joint pain.   . Red Yeast Rice [Cholestin] Other (See Comments)    Muscle pain and severe joint pain.   . Metronidazole Nausea Only and Swelling    Nausea and vomiting   . Neupro [Rotigotine]     Extreme edema   . Shellfish Allergy Other (See Comments)    UNSPECIFIED REACTION   . Suprep [Na Sulfate-K Sulfate-Mg Sulf] Nausea And Vomiting  . Tape Other (See Comments)    Electrodes causes skin breakdown    Antimicrobials this admission: Cefazolin 4/24  >>  Vancomycin 4/24 >>   Dose adjustments this  admission: N/A  Microbiology results: None at this time.  Thank you for allowing pharmacy to be a part of this patient's care.  Sinda Leedom C Huyen Perazzo,PharmD 01/02/2018 2:11 PM

## 2018-01-02 NOTE — ED Notes (Addendum)
Pts husband brought her home meds for her to take, including: Metformin 1000mg  po Xyzal 5mg  po Plaquinil 200mg  po Gabapentin 400mg  po Brilinta 90mg  po Aspirin 81mg  po Metoprolol 25mg  po Mucinex 1200 PO Enalapril 10mg  po Levothyroxine 200mg  po Folic Acid 5000mg  po Oxycodone ER 18mg  po Ibuprofen 400mg  po Sliding Scale Humulin R-500 - 45Units SQ in the Abdomen. Per VO edp, pt may take home meds.

## 2018-01-02 NOTE — H&P (Signed)
History and Physical    [name] HGD:[id number] DOB: 1961-04-23 DOA: 01/02/2018  PCP: Shelda Pal, DO   Patient coming from: Home  Chief Complaint: Left LE swelling and pain  HPI: [name] is a 57 y.o. female with medical history significant for insulin-dependent diabetes mellitus, CAD status post stenting, mixed connective tissue disease, history of asthma, history of chronic diastolic CHF, fibromyalgia and chronic pain syndrome, hypertension, hyperlipidemia, hypothyroidism, GERD, irritable bowel syndrome, and other comorbidities who presented to the emergency room with a chief complaint of left lower extremity swelling, discomfort, and erythema with warmth.  Patient states that on Saturday night she started noticing her leg getting discolored however she did not pay attention to it.  On the morning she woke up and started on nauseous Monday morning she started vomiting actively.  On Tuesday night she went to bed and noticed that her leg was severely swollen and erythematous and because of concern and because of the discoloration and rash-like spots in the discoloration she presented to the emergency room for evaluation.  Patient feels like she has been dehydrated because of nausea and vomiting and states that she had a little slight trouble breathing.  Denied chest pain, nausea, vomiting at this time.  She did complain of some lower back pain on the left side of her gluteus maximus.  Patient also knows that she has 3 ulcerative lesions in the lower left extremity that have been draining purulent drainage and [place].  Patient has tried Silvadene cream for her rash and redness and endorse that she had a fever of 102 prior to this.  Patient states that she also has been more sedentary than usual but denies any other complaints at this time.  TRH was called to admit for left lower extremity cellulitis and she was placed on IV  Ancef and IV vancomycin.  ED Course: Was seen at [facility].  She is given IV vancomycin and IV Ancef and transferred to [facility].  Review of Systems: As per HPI otherwise 10 point review of systems negative.   Past Medical History:  Diagnosis Date  . Asthma    QVAR daily and Albuterol as needed  . Chronic diastolic CHF (congestive heart failure) ([place]) 09/2015  . Chronic pain syndrome    Fentanyl Patch  . Colon polyps   . Coronary artery disease    cath with normal LM, 30% LAD, 85% mid RCA and 95% distal RCA s/p PCI of the mid to distal RCA and now on DAPT with ASA and Ticagrelor.    . Diabetes mellitus    takes Metformin and Hum R daily  . Eczema   . Excessive daytime sleepiness 11/26/2015  . Fibromyalgia   . Gallstones   . GERD (gastroesophageal reflux disease)   . Hyperlipidemia   . Hypertension    takes Metoprolol and Enalapril daily  . Hypothyroidism    takes Synthroid daily  . IBS (irritable bowel syndrome)   . Mixed connective tissue disease [facility])     Past Surgical History:  Procedure Laterality Date  . ABDOMINAL HYSTERECTOMY    . APPENDECTOMY    . BREAST BIOPSY Bilateral    9 total  . CARDIAC CATHETERIZATION    . cardiac stents N/A 05/23/2016   pt notified department upon arrival for exam .....Marland Kitchen2 stents  . CHOLECYSTECTOMY    . CORONARY ANGIOPLASTY     normal LM, 30% LAD,  85% mid RCA and 95% distal RCA s/p PCI of the mid to distal RCA and now on DAPT with ASA and Ticagrelor.    . LEG SURGERY Left    muscle biospy  . RADIOLOGY WITH ANESTHESIA N/A 05/25/2016   Procedure: RADIOLOGY WITH ANESTHESIA;  Surgeon: Medication Radiologist, MD;  Location: [facility];  Service: Radiology;  Laterality: N/A;  . TONSILLECTOMY AND ADENOIDECTOMY    . WRIST SURGERY Left    SOCIAL HISTORY  reports that she quit smoking about 13 years ago. Her smoking use included cigarettes. She has a 20.00 pack-year smoking history. She has never used smokeless tobacco. She reports that  she drinks alcohol. She reports that she does not use drugs.  Allergies  Allergen Reactions  . Fish Allergy Anaphylaxis    INCLUDES OMEGA 3 OILS  . Fish Oil Anaphylaxis and Swelling    THROAT SWELLS INCLUDES FISH AS A CLASS  . Fish Oil [Omega-3 Fatty Acids] Swelling    Throat swelling   . Other Anaphylaxis and Other (See Comments)    UNSPECIFIED REACTION  Perch  . Crestor [Rosuvastatin] Other (See Comments)    Extreme joint pain, to this & other statins  . Gabapentin Anxiety    Anxiety on high doses  . Lovastatin Other (See Comments)    EXTREME JOINT PAIN  . Monascus Purpureus Went Yeast Other (See Comments)    Muscle pain and severe joint pain.   . Red Yeast Rice [Cholestin] Other (See Comments)    Muscle pain and severe joint pain.   . Metronidazole Nausea Only and Swelling    Nausea and vomiting   . Neupro [Rotigotine]     Extreme edema   . Shellfish Allergy Other (See Comments)    UNSPECIFIED REACTION   . Suprep [Na Sulfate-K Sulfate-Mg Sulf] Nausea And Vomiting  . Tape Other (See Comments)    Electrodes causes skin breakdown    Family History  Problem Relation Age of Onset  . CAD Mother   . Hypertension Mother   . Heart attack Mother   . CAD Father   . Heart attack Father   . Allergic rhinitis Father   . Asthma Father   . Hypertension Brother   . Hypertension Brother   . Pancreatic cancer Paternal Aunt   . Breast cancer Paternal Aunt   . Lung cancer Paternal Aunt    Prior to Admission medications   Medication Sig Start Date End Date Taking? Authorizing Provider  albuterol (PROAIR HFA) 108 (90 Base) MCG/ACT inhaler Inhale 2 puffs into the lungs every 4 (four) hours as needed for wheezing or shortness of breath. 12/27/17  Yes Shelda Pal, DO  aspirin EC 81 MG tablet Take 81 mg by mouth daily.   Yes [provider]  betamethasone dipropionate (DIPROLENE) 0.05 % cream Apply 1 application topically daily as needed (eczema). 10/03/17  Yes  Wendling, Crosby Oyster, DO  BRILINTA 90 MG TABS tablet TAKE ONE TABLET BY MOUTH TWICE DAILY 03/28/17  Yes Turner, Eber Hong, MD  diphenhydrAMINE (BENADRYL) 25 MG tablet Take 1 tablet (25 mg total) by mouth every 6 (six) hours as needed for itching or allergies. 04/05/13  Yes Domenic Moras, PA-C  enalapril (VASOTEC) 10 MG tablet Take 1 tablet (10 mg total) 2 (two) times daily by mouth. 07/25/17  Yes Wendling, Crosby Oyster, DO  Estrogens, Conjugated (PREMARIN VA) Place 1 application vaginally 3 (three) times a week. On Saturdays, Mondays, and Wednesday (cream)   Yes [provider]  fluticasone (FLONASE) 50 MCG/ACT nasal spray USE 2 SPRAY(S) IN EACH NOSTRIL ONCE DAILY AS NEEDED FOR ALLERGIES OR  RHINITIS 12/27/17  Yes Shelda Pal, DO  fluticasone furoate-vilanterol (BREO ELLIPTA) 200-25 MCG/INH AEPB Inhale 1 puff into the lungs daily. 11/02/17  Yes Shelda Pal, DO  gabapentin (NEURONTIN) 100 MG capsule Take 400-700 mg by mouth See admin instructions. Pt takes 446m in the morning, 402mat lunchtime and 7006mn the evening   Yes [provider]  GLUCOMANNAN PO Take 700 mg by mouth 2 (two) times daily.   Yes [provider]  Guaifenesin (MUCINEX MAXIMUM STRENGTH) 1200 MG TB12 Take 1,200 mg by mouth 2 (two) times daily.   Yes [provider]  hydroxychloroquine (PLAQUENIL) 200 MG tablet Take 200 mg by mouth 2 (two) times daily.   Yes [provider]  insulin regular human CONCENTRATED (HUMULIN R) 500 UNIT/ML SOLN injection Inject 0-100 Units into the skin 4 (four) times daily -  before meals and at bedtime. Sliding Scale from provider   Yes [provider]  ipratropium-albuterol (DUONEB) 0.5-2.5 (3) MG/3ML SOLN Take 3 mLs by nebulization every 4 (four) hours as needed (SOB, wheezing). 10/05/17  Yes Wendling, NicCrosby OysterO  levocetirizine (XYZAL) 5 MG tablet Take 1 tablet (5 mg total) by mouth every evening. 11/02/17  Yes Wendling,  NicCrosby OysterO  levothyroxine (SYNTHROID, LEVOTHROID) 200 MCG tablet Take 200 mcg by mouth daily before breakfast. Except 100 mcg on Monday   Yes [provider]  metFORMIN (GLUCOPHAGE-XR) 500 MG 24 hr tablet Take 1,000 mg by mouth 2 (two) times daily. 11/25/15  Yes [provider]  metoprolol tartrate (LOPRESSOR) 25 MG tablet Take 1 tablet (25 mg total) by mouth 2 (two) times daily. Please make overdue yearly appt with Dr. TurRadford Paxfore anymore refills 2nd attempt 12/27/17  Yes Turner, TraEber HongD  milk thistle 175 MG tablet Take 175 mg by mouth 2 (two) times daily.    Yes [provider]  ondansetron (ZOFRAN) 4 MG tablet Take 1 tablet (4 mg total) by mouth every 8 (eight) hours as needed for nausea or vomiting. 02/23/16  Yes NguHarvel QualeD  oxyCODONE ER ([facility]TE[facility]) 18 MG C12A Take 18 mg by mouth 2 (two) times daily.    Yes [provider]  oxymetazoline (AFRIN) 0.05 % nasal spray Place 1 spray into both nostrils 2 (two) times daily as needed for congestion.   Yes [provider]  Polyethyl Glycol-Propyl Glycol (SYSTANE ULTRA OP) Apply 1 drop to eye daily as needed (dry eyes).   Yes [provider]  promethazine (PHENERGAN) 25 MG tablet Take 1 tablet (25 mg total) by mouth every 8 (eight) hours as needed for nausea or vomiting. 11/02/17  Yes WenShelda PalO  pyridoxine (B-6) 100 MG tablet Take 100 mg by mouth 2 (two) times daily.   Yes [provider]  rotigotine (NEUPRO) 2 MG/24HR Place 1 patch onto the skin daily.   Yes [provider]  saccharomyces boulardii (FLORASTOR) 250 MG capsule Take 1 capsule (250 mg total) by mouth 2 (two) times daily. Patient taking differently: Take 250 mg by mouth 2 (two) times daily. Take 1 capsule with breakfast, 1 with lunch, and 2 at bedtime 02/13/15  Yes KriBonnielee HaffD  silver sulfADIAZINE (SILVADENE) 1 % cream Apply 1 application topically daily. 01/04/17  Yes Harris,  Abigail, PA-C  vitamin A 10000 UNIT capsule Take 10,000 Units by mouth daily.  Yes [provider]  vitamin B-12 (CYANOCOBALAMIN) 1000 MCG tablet Take 1,000 mcg by mouth daily.     Yes [provider]  Vitamin D, Ergocalciferol, (DRISDOL) 50000 units CAPS capsule 50,000 Units every 7 (seven) days. Take on Saturday evenings 07/25/17  Yes Wendling, Crosby Oyster, DO  Vitamins-Lipotropics (LIPO-FLAVONOID PLUS PO) Take 1 tablet by mouth 3 (three) times daily.   Yes [provider]  clotrimazole-betamethasone (LOTRISONE) cream Apply 1 application topically daily as needed (eczema).    [provider]  EPINEPHrine (EPIPEN 2-PAK) 0.3 mg/0.3 mL IJ SOAJ injection USE AS DIRECTED FOR SEVERE ALLERGIC REACTION. 10/30/16   Charlies Silvers, MD  furosemide (LASIX) 40 MG tablet Take 1 tablet (40 mg total) by mouth daily as needed for fluid. 11/02/17   Shelda Pal, DO  glucose blood (BAYER CONTOUR TEST) test strip CHECK BLOOD SUGAR 4 TIMES DAILY 03/28/17   [provider]   Physical Exam: Vitals:   01/02/18 1430 01/02/18 1500 01/02/18 1520 01/02/18 1530  BP:   132/68 114/68  Pulse: 78 87 83 85  Resp: _0 Temp:    98.2 F (36.8 C)  TempSrc:    Oral  SpO2: 94% 96% 96% 95%  Weight:      Height:       Constitutional: WN/W morbdily obese Cacuasian female, NAD and appears calm and comfortable Eyes: Lids and conjunctivae normal, sclerae anicteric  ENMT: External Ears, Nose appear normal. Grossly normal hearing. Mucous membranes are moist. Neck: Appears normal, supple, no cervical masses, normal ROM, no appreciable thyromegaly; no JVD Respiratory: Diminished to auscultation bilaterally, no wheezing, rales, rhonchi or crackles. Normal respiratory effort and patient is not tachypenic. No accessory muscle use.  Cardiovascular: RRR, no murmurs / rubs / gallops. S1 and S2 auscultated. Trace LE edema; Difficult to palpate Right Pedal Pulse Abdomen: Soft,  NT, Distended due to body habitus; No appreciable hepatosplenomegaly. Bowel sounds positive x4.  GU: Deferred. Musculoskeletal: Nails are discolored and falling off. No joint deformity upper and lower extremities. Good ROM, no contractures.  Skin: Has Left Lower extremity and warmth and is painful to palpate. Has some Eczema in Upper extremities that has been healing. Has 4 small Ulcers in the Left Lower Extremity.  Neurologic: CN 2-12 grossly intact with no focal deficits. Sensation intact in all 4 Extremities, DTR normal. Strength 5/5 in all 4. Romberg sign cerebellar reflexes not assessed.  Psychiatric: Normal judgment and insight. Alert and oriented x 3. Normal mood and appropriate affect.   Labs on Admission: I have personally reviewed following labs and imaging studies  CBC: Recent Labs  Lab 01/02/18 0243  WBC 10.2  NEUTROABS 7.2  HGB 13.5  HCT 41.4  MCV 90.8  PLT 629   Basic Metabolic Panel: Recent Labs  Lab 01/02/18 0243  NA 133*  K 3.9  CL 98*  CO2 25  GLUCOSE 257*  BUN 17  CREATININE 1.01*  CALCIUM 8.8*   GFR: Estimated Creatinine Clearance: 79.8 mL/min (A) (by C-G formula based on SCr of 1.01 mg/dL (H)). Liver Function Tests: Recent Labs  Lab 01/02/18 0243  AST 41  ALT 45  ALKPHOS 62  BILITOT 0.5  PROT 6.9  ALBUMIN 3.6   No results for input(s): LIPASE, AMYLASE in the last 168 hours. No results for input(s): AMMONIA in the last 168 hours. Coagulation Profile: No results for input(s): INR, PROTIME in the last 168 hours. Cardiac Enzymes: No results for input(s): CKTOTAL, CKMB, CKMBINDEX, TROPONINI  in the last 168 hours. BNP (last 3 results) No results for input(s): PROBNP in the last 8760 hours. HbA1C: No results for input(s): HGBA1C in the last 72 hours. CBG: Recent Labs  Lab 01/02/18 1036  GLUCAP 171*   Lipid Profile: No results for input(s): CHOL, HDL, LDLCALC, TRIG, CHOLHDL, LDLDIRECT in the last 72 hours. Thyroid Function Tests: No  results for input(s): TSH, T4TOTAL, FREET4, T3FREE, THYROIDAB in the last 72 hours. Anemia Panel: No results for input(s): VITAMINB12, FOLATE, FERRITIN, TIBC, IRON, RETICCTPCT in the last 72 hours. Urine analysis:    Component Value Date/Time   COLORURINE YELLOW 04/28/2016 2012   APPEARANCEUR CLEAR 04/28/2016 2012   LABSPEC 1.004 (L) 04/28/2016 2012   PHURINE 6.5 04/28/2016 2012   GLUCOSEU 100 (A) 04/28/2016 2012   HGBUR NEGATIVE 04/28/2016 2012   BILIRUBINUR NEGATIVE 04/28/2016 2012   [place] NEGATIVE 04/28/2016 2012   PROTEINUR NEGATIVE 04/28/2016 2012   UROBILINOGEN 0.2 02/10/2015 1835   NITRITE NEGATIVE 04/28/2016 2012   LEUKOCYTESUR NEGATIVE 04/28/2016 2012   Sepsis Labs: !!!!!!!!!!!!!!!!!!!!!!!!!!!!!!!!!!!!!!!!!!!! _0 (procalcitonin:4,lacticidven:4) )No results found for this or any previous visit (from the past 240 hour(s)).   Radiological Exams on Admission: Dg Chest 2 View  Result Date: 01/02/2018 CLINICAL DATA:  LEFT leg cellulitis for 3 days.  Pain and swelling. EXAM: CHEST - 2 VIEW COMPARISON:  Chest radiograph October 05, 2017 FINDINGS: Cardiac silhouette is upper limits of normal in size, mediastinal silhouette is nonsuspicious. Pulmonary vascular congestion. Strandy densities LEFT lung base without pleural effusion or focal consolidation. Azygos fissure. Calcification RIGHT humeral head seen with calcific tendinopathy. Surgical clips in the included right abdomen compatible with cholecystectomy. Degenerative change of the thoracic spine and included cervical spine. Large body habitus. IMPRESSION: Borderline cardiomegaly. Pulmonary vascular congestion. LEFT lung base atelectasis. Electronically Signed   By: Elon Alas M.D.   On: 01/02/2018 03:34   EKG: No EKG ordered on admission so will order currently.  Assessment/Plan Active Problems:   Insulin dependent diabetes mellitus (HCC)   Hyperlipidemia LDL goal <70   Mixed connective tissue disease (HCC)    Hypothyroidism   Benign essential HTN   Chronic pain associated with significant psychosocial dysfunction   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HCC)   Fibromyalgia   Diabetic polyneuropathy (HCC)   Coronary artery disease involving native coronary artery of native heart without angina pectoris   Mild persistent asthma   Hyponatremia   Cellulitis   Cellulitis of lower leg  Acute Left Lower Leg Extremity Cellulitis -Admit as inpatient to telemetry -Blood cultures x2 ordered, as well as ESR and CRP -Check venous duplex to evaluate for lower extremity DVT -Empiric antibiotics with Ancef and IV vancomycin -Wound care consultation for small ulcers and leg -We will also check ABIs given diminished pulses in right lower extremity -Given IV fluid hydration with normal saline at 75 mL's per hour x12 hours -If not improving will consider imaging of the Leg -Continue to monitor and repeat CBC in a.m.  Lactic Acidosis -Likely from infection above -Give IV fluid hydration 75 mL's per hour times 12 hours and repeat lactic acid levels every 3 hours  Hyponatremia/Hypochloremia -Likely from nausea and vomiting -Give IV fluid hydration as above -Repeat CMP in a.m.  Insulin-dependent Diabetes Mellitus Type 2 -Not on any Long Acting SSI -Hold Home Metformin and U500 SSI -Check hemoglobin A1c -Placed on moderate NovoLog sliding scale insulin before meals and at bedtime -Appreciate diabetes education coordinator assistance  Renal Insuffiencey -Patient' BUN/Cr slightly elevated compared  to baseline -Give IV fluid hydration as above -Avoid nephrotoxic's and hold enalapril as well as Lasix at this time -Check urinalysis -Repeat CMP in the morning  CAD status post stenting -Aspirin 81 mg and Brilinta 90 mg p.o. twice daily -Continue metoprolol tartrate 20 mg p.o. twice daily and hold patient's enalapril 10 mg p.o. twice daily for now -Currently denies any chest pain  Mixed Connective  Tissue Disease -Hold patient's Plaquenil for now  History of Asthma and Allergic Rhinitis -Currently not in Exacerbation -Continue Brio Ellipta 1 puff inhalation daily -Continue with duo nebs 3 mL's every 4 as needed for wheezing or shortness of breath -Continue with oxygen metolazone 1 spray twice daily as needed  Fibromyalgia/Chronic pain Syndrome -Patient takes 18 mg of p.o. Oxycodone ER twice daily; will substitute that for 50 mg oxycodone twice daily while hospitalized and increase dose as necessary -Continue with home gabapentin dose 400 mg p.o. twice daily with breakfast and lunch  Essential Hypertension -Continue with metoprolol tartrate 25 mg p.o. twice daily and hold enalapril 10 mg p.o. twice daily for now  Hyperlipidemia -Patient is statin intolerant -Follow-up as outpatient  Hypothyroidism -Check TSH -Levothyroxine dose 100 mcg every Monday as well as 200 mcg p.o. every other day  Chronic diastolic CHF -Hold patient's Lasix for now -Chest x-ray did show some borderline cardiomegaly as well as some pulmonary vascular congestion.  There is also left lung base atelectasis -Continue with metoprolol tartrate 25 mg p.o. twice daily, will hold patient's enalapril 10 mL p.o. twice daily -Check BNP -Strict I's and O's, daily weights, -Appears euvolemic on examination without   Morbid Obesity -His BMI on admission was 54.8 -Weight loss counseling given  DVT prophylaxis: Heparin 5000 units subcu every 8h Code Status: Full code Family Communication: Discussed with husband at bedside Disposition Plan: Remain inpatient for treatment of cellulitis and anticipate discharge Home once clinically improved Consults called: None Admission status: Inpatient telemetry  Severity of Illness: The appropriate patient status for this patient is INPATIENT. Inpatient status is judged to be reasonable and necessary in order to provide the required intensity of service to ensure the  patient's safety. The patient's presenting symptoms, physical exam findings, and initial radiographic and laboratory data in the context of their chronic comorbidities is felt to place them at high risk for further clinical deterioration. Furthermore, it is not anticipated that the patient will be medically stable for discharge from the hospital within 2 midnights of admission. The following factors support the patient status of inpatient.   " The patient's presenting symptoms include left lower extremity erythema, warmth, swelling, and pain as well as subjective fever. " The worrisome physical exam findings include 3 small ulcerations and lower extremity swelling and warmth. " The initial radiographic and laboratory data are re-assuring. " The chronic co-morbidities include diabetes, CAD, mixed connective tissue disease, asthma, chronic diastolic CHF, chronic pain, hypertension, other comorbidities.  * I certify that at the point of admission it is my clinical judgment that the patient will require inpatient hospital care spanning beyond 2 midnights from the point of admission due to high intensity of service, high risk for further deterioration and high frequency of surveillance required.Kerney Elbe, D.O. [facility] Pager [phone]  If 7PM-7AM, please contact night-coverage [email or web address] Password [facility]  01/02/2018, 6:16 PM

## 2018-01-02 NOTE — ED Triage Notes (Signed)
Pt with left leg swelling, redness and rash x 3 days. Reports fever of 102 PTA- no fever present. Reports vomiting x 2 days ago.

## 2018-01-02 NOTE — ED Notes (Signed)
Report given to [facility]. 

## 2018-01-02 NOTE — ED Provider Notes (Addendum)
[facility] Provider Note: Lowella Dell, MD, FACEP  CSN: [id number] MRN: [id number] ARRIVAL: 01/02/18 at 0159 ROOM: MH02/MH02   CHIEF COMPLAINT  Leg Swelling   HISTORY OF PRESENT ILLNESS  01/02/18 2:40 AM [name] is a 57 y.o. female with multiple medical problems including diabetes and coronary artery disease.  She is on Plaquenil for mixed connective tissue disease.  She has had 2 to 3 days of nausea and vomiting.  That has improved but over the last 24 hours she has developed erythema of her left leg.  The erythema began posteriorly but now has involved almost the entire leg.  She had some sores on her leg which she had been treating for the past week with Silvadene cream.  She thinks this may have been the entry port for infection.  She has had a fever to 102 but not on arrival.  She is having moderate pain associated with this.  She is on chronic narcotic treatment for chronic pain   Past Medical History:  Diagnosis Date  . Asthma    QVAR daily and Albuterol as needed  . Chronic diastolic CHF (congestive heart failure) (HCC) 09/2015  . Chronic pain syndrome    Fentanyl Patch  . Colon polyps   . Coronary artery disease    cath with normal LM, 30% LAD, 85% mid RCA and 95% distal RCA s/p PCI of the mid to distal RCA and now on DAPT with ASA and Ticagrelor.    . Diabetes mellitus    takes Metformin and Hum R daily  . Eczema   . Excessive daytime sleepiness 11/26/2015  . Fibromyalgia   . Gallstones   . GERD (gastroesophageal reflux disease)   . Hyperlipidemia   . Hypertension    takes Metoprolol and Enalapril daily  . Hypothyroidism    takes Synthroid daily  . IBS (irritable bowel syndrome)   . Mixed connective tissue disease [facility])     Past Surgical History:  Procedure Laterality Date  . ABDOMINAL HYSTERECTOMY    . APPENDECTOMY    . BREAST BIOPSY Bilateral    9 total  . CARDIAC CATHETERIZATION    . cardiac stents N/A 05/23/2016   pt notified  department upon arrival for exam .....Marland Kitchen2 stents  . CHOLECYSTECTOMY    . CORONARY ANGIOPLASTY     normal LM, 30% LAD, 85% mid RCA and 95% distal RCA s/p PCI of the mid to distal RCA and now on DAPT with ASA and Ticagrelor.    . LEG SURGERY Left    muscle biospy  . RADIOLOGY WITH ANESTHESIA N/A 05/25/2016   Procedure: RADIOLOGY WITH ANESTHESIA;  Surgeon: Medication Radiologist, MD;  Location: [facility] OR;  Service: Radiology;  Laterality: N/A;  . TONSILLECTOMY AND ADENOIDECTOMY    . WRIST SURGERY Left     Family History  Problem Relation Age of Onset  . CAD Mother   . Hypertension Mother   . Heart attack Mother   . CAD Father   . Heart attack Father   . Allergic rhinitis Father   . Asthma Father   . Hypertension Brother   . Hypertension Brother   . Pancreatic cancer Paternal Aunt   . Breast cancer Paternal Aunt   . Lung cancer Paternal Aunt     Social History   Tobacco Use  . Smoking status: Former Smoker    Packs/day: 1.00    Years: 20.00    Pack years: 20.00    Types: Cigarettes  Last attempt to quit: 02/11/2004    Years since quitting: 13.9  . Smokeless tobacco: Never Used  Substance Use Topics  . Alcohol use: Yes    Comment: once a month  . Drug use: No    Prior to Admission medications   Medication Sig Start Date End Date Taking? Authorizing Provider  oxyCODONE ER (XTAMPZA ER) 18 MG C12A Take by mouth.   Yes [provider]  rotigotine (NEUPRO) 2 MG/24HR Place 1 patch onto the skin daily.   Yes [provider]  albuterol (PROAIR HFA) 108 (90 Base) MCG/ACT inhaler Inhale 2 puffs into the lungs every 4 (four) hours as needed for wheezing or shortness of breath. 12/27/17   Sharlene Dory, DO  aspirin EC 81 MG tablet Take 81 mg by mouth daily.    [provider]  b complex vitamins tablet Take 1 tablet by mouth daily.    [provider]  betamethasone dipropionate (DIPROLENE) 0.05 % cream Apply 1 application topically daily as  needed (eczema). 10/03/17   Wendling, Jilda Roche, DO  BRILINTA 90 MG TABS tablet TAKE ONE TABLET BY MOUTH TWICE DAILY 03/28/17   Quintella Reichert, MD  clotrimazole-betamethasone (LOTRISONE) cream Apply 1 application topically daily as needed (eczema).    [provider]  diphenhydrAMINE (BENADRYL) 25 MG tablet Take 1 tablet (25 mg total) by mouth every 6 (six) hours as needed for itching or allergies. 04/05/13   Fayrene Helper, PA-C  enalapril (VASOTEC) 10 MG tablet Take 1 tablet (10 mg total) 2 (two) times daily by mouth. 07/25/17   Wendling, Jilda Roche, DO  EPINEPHrine (EPIPEN 2-PAK) 0.3 mg/0.3 mL IJ SOAJ injection USE AS DIRECTED FOR SEVERE ALLERGIC REACTION. 10/30/16   Fletcher Anon, MD  Estrogens, Conjugated (PREMARIN VA) Place 1 application vaginally 3 (three) times a week. On Saturdays, Mondays, and Wednesday (cream)    [provider]  fluticasone (FLONASE) 50 MCG/ACT nasal spray USE 2 SPRAY(S) IN EACH NOSTRIL ONCE DAILY AS NEEDED FOR ALLERGIES OR  RHINITIS 12/27/17   Carmelia Roller, Jilda Roche, DO  fluticasone furoate-vilanterol (BREO ELLIPTA) 200-25 MCG/INH AEPB Inhale 1 puff into the lungs daily. 11/02/17   Sharlene Dory, DO  furosemide (LASIX) 40 MG tablet Take 1 tablet (40 mg total) by mouth daily as needed for fluid. 11/02/17   Sharlene Dory, DO  gabapentin (NEURONTIN) 100 MG capsule Take 400-700 mg by mouth See admin instructions. Pt takes 400mg  in the morning, 400mg  at lunchtime and 700mg  in the evening    [provider]  GLUCOMANNAN PO Take 700 mg by mouth 2 (two) times daily.    [provider]  glucose blood (BAYER CONTOUR TEST) test strip CHECK BLOOD SUGAR 4 TIMES DAILY 03/28/17   [provider]  Guaifenesin (MUCINEX MAXIMUM STRENGTH) 1200 MG TB12 Take 1,200 mg by mouth 2 (two) times daily.    [provider]  hydroxychloroquine (PLAQUENIL) 200 MG tablet Take 200 mg by mouth 2 (two) times daily.    [provider]  insulin regular human CONCENTRATED (HUMULIN R) 500 UNIT/ML SOLN injection Inject 0-100 Units into the skin 4 (four) times daily -  before meals and at bedtime. TID w/ meals Cbgs <125 = no insulin          125-150 = 10 units using insulin syringe ( actual dose is 50 units)          151 - 200 = 15 units using insulin syringe ( actual dose  is 75 units)          > 201 = 20 units using insulin syringe ( actual dose is 100 units)  QHS Cbgs <150 = no insulin          151-200= 5 units using insulin syringe ( actual dose is 25 units)          201 - 300 = 10 units using insulin syringe ( actual dose is 50 units)          > 300 = 15 units using insulin syringe ( actual dose is 75 units)    [provider]  ipratropium-albuterol (DUONEB) 0.5-2.5 (3) MG/3ML SOLN Take 3 mLs by nebulization every 4 (four) hours as needed (SOB, wheezing). 10/05/17   Sharlene Dory, DO  levocetirizine (XYZAL) 5 MG tablet Take 1 tablet (5 mg total) by mouth every evening. 11/02/17   Wendling, Jilda Roche, DO  levothyroxine (SYNTHROID, LEVOTHROID) 200 MCG tablet Take 200 mcg by mouth daily before breakfast.     [provider]  metFORMIN (GLUCOPHAGE-XR) 500 MG 24 hr tablet Take 1,000 mg by mouth 2 (two) times daily. 11/25/15   [provider]  metoprolol tartrate (LOPRESSOR) 25 MG tablet Take 1 tablet (25 mg total) by mouth 2 (two) times daily. Please make overdue yearly appt with Dr. Mayford Knife before anymore refills 2nd attempt 12/27/17   Quintella Reichert, MD  milk thistle 175 MG tablet Take 175 mg by mouth 2 (two) times daily.     [provider]  ondansetron (ZOFRAN) 4 MG tablet Take 1 tablet (4 mg total) by mouth every 8 (eight) hours as needed for nausea or vomiting. 02/23/16   Leta Baptist, MD  oxymetazoline (AFRIN) 0.05 % nasal spray Place 1 spray into both nostrils 2 (two) times daily as needed for congestion.    [provider]  Polyethyl Glycol-Propyl  Glycol (SYSTANE ULTRA OP) Apply 1 drop to eye daily as needed (dry eyes).    [provider]  promethazine (PHENERGAN) 25 MG tablet Take 1 tablet (25 mg total) by mouth every 8 (eight) hours as needed for nausea or vomiting. 11/02/17   Carmelia Roller, Jilda Roche, DO  saccharomyces boulardii (FLORASTOR) 250 MG capsule Take 1 capsule (250 mg total) by mouth 2 (two) times daily. 02/13/15   Osvaldo Shipper, MD  silver sulfADIAZINE (SILVADENE) 1 % cream Apply 1 application topically daily. 01/04/17   Arthor Captain, PA-C  vitamin A 38250 UNIT capsule Take 10,000 Units by mouth daily.    [provider]  vitamin B-12 (CYANOCOBALAMIN) 1000 MCG tablet Take 1,000 mcg by mouth daily.      [provider]  Vitamin D, Ergocalciferol, (DRISDOL) 50000 units CAPS capsule TAKE 1 CAP BY MOUTH ONCE A month 07/25/17   Sharlene Dory, DO  Vitamins-Lipotropics (LIPO-FLAVONOID PLUS PO) Take 1 tablet by mouth 3 (three) times daily.    [provider]    Allergies Fish allergy; Fish oil; Fish oil [omega-3 fatty acids]; Other; Crestor [rosuvastatin]; Gabapentin; Lovastatin; Monascus purpureus went yeast; Red yeast rice [cholestin]; Metronidazole; Neupro [rotigotine]; Shellfish allergy; Suprep [na sulfate-k sulfate-mg sulf]; and Tape   REVIEW OF SYSTEMS  Negative except as noted here or in the History of Present Illness.   PHYSICAL EXAMINATION  Initial Vital Signs Blood pressure 123/69, pulse 96, temperature 98.1 F (36.7 C), temperature source Oral, resp. rate 18, height 5\' 1"  (1.549 m), weight 131.5 kg (290 lb), SpO2 96 %.  Examination General: Well-developed, obese  female in no acute distress; appearance consistent with age of record HENT: normocephalic; atraumatic Eyes: pupils equal, round and reactive to light; extraocular muscles intact Neck: supple Heart: regular rate and rhythm Lungs: clear to auscultation bilaterally Abdomen: soft; obese; nontender; bowel sounds  present Extremities: No deformity; full range of motion; pulses normal Neurologic: Awake, alert and oriented; motor function intact in all extremities and symmetric; no facial droop Skin: Warm and dry; erythema, warmth and tenderness of left lower extremity:   Psychiatric: Normal mood and affect   RESULTS  Summary of this visit's results, reviewed by myself:   EKG Interpretation  Date/Time:    Ventricular Rate:    PR Interval:    QRS Duration:   QT Interval:    QTC Calculation:   R Axis:     Text Interpretation:        Laboratory Studies: Results for orders placed or performed during the hospital encounter of 01/02/18 (from the past 24 hour(s))  Comprehensive metabolic panel     Status: Abnormal   Collection Time: 01/02/18  2:43 AM  Result Value Ref Range   Sodium 133 (L) 135 - 145 mmol/L   Potassium 3.9 3.5 - 5.1 mmol/L   Chloride 98 (L) 101 - 111 mmol/L   CO2 25 22 - 32 mmol/L   Glucose, Bld 257 (H) 65 - 99 mg/dL   BUN 17 6 - 20 mg/dL   Creatinine, Ser 9.67 (H) 0.44 - 1.00 mg/dL   Calcium 8.8 (L) 8.9 - 10.3 mg/dL   Total Protein 6.9 6.5 - 8.1 g/dL   Albumin 3.6 3.5 - 5.0 g/dL   AST 41 15 - 41 U/L   ALT 45 14 - 54 U/L   Alkaline Phosphatase 62 38 - 126 U/L   Total Bilirubin 0.5 0.3 - 1.2 mg/dL   GFR calc non Af Amer >60 >60 mL/min   GFR calc Af Amer >60 >60 mL/min   Anion gap 10 5 - 15  CBC with Differential     Status: None   Collection Time: 01/02/18  2:43 AM  Result Value Ref Range   WBC 10.2 4.0 - 10.5 K/uL   RBC 4.56 3.87 - 5.11 MIL/uL   Hemoglobin 13.5 12.0 - 15.0 g/dL   HCT 59.1 63.8 - 46.6 %   MCV 90.8 78.0 - 100.0 fL   MCH 29.6 26.0 - 34.0 pg   MCHC 32.6 30.0 - 36.0 g/dL   RDW 59.9 35.7 - 01.7 %   Platelets 157 150 - 400 K/uL   Neutrophils Relative % 71 %   Neutro Abs 7.2 1.7 - 7.7 K/uL   Lymphocytes Relative 18 %   Lymphs Abs 1.9 0.7 - 4.0 K/uL   Monocytes Relative 9 %   Monocytes Absolute 0.9 0.1 - 1.0 K/uL   Eosinophils Relative 2 %    Eosinophils Absolute 0.2 0.0 - 0.7 K/uL   Basophils Relative 0 %   Basophils Absolute 0.0 0.0 - 0.1 K/uL  I-Stat CG4 Lactic Acid, ED     Status: Abnormal   Collection Time: 01/02/18  2:51 AM  Result Value Ref Range   Lactic Acid, Venous 2.20 (HH) 0.5 - 1.9 mmol/L   Comment NOTIFIED PHYSICIAN    Imaging Studies: No results found.  ED COURSE and MDM  Nursing notes and initial vitals signs, including pulse oximetry, reviewed.  Vitals:   01/02/18 0210 01/02/18 0211  BP: 123/69   Pulse: 96   Resp: 18   Temp: 98.1 F (  36.7 C)   TempSrc: Oral   SpO2: 96%   Weight:  131.5 kg (290 lb)  Height:  5\' 1"  (1.549 m)   2:55 AM Ancef IV ordered for cellulitis.  IV fluid bolus ordered for elevated lactate although she does not otherwise meet sepsis criteria.  3:36 AM Vancomycin added after discussion with hospitalist.  Dr. will admit to the hospitalist service at [facility].  PROCEDURES    ED DIAGNOSES     ICD-10-CM   1. Cellulitis of left leg L03.116        Bosten Newstrom, MD 01/02/18 0323    01/04/18, MD 01/02/18 [phone]

## 2018-01-02 NOTE — Consult Note (Signed)
WOC Nurse wound consult note Reason for Consult: Patient with great toenails in process of auto debriding, also scattered full and partial thickness circular wounds on LEs. Wound type: Infectious, atypical presentation Pressure Injury POA: NA Measurement: circular shallow, dry wound beds, largest measures 1cm round x 0.2cm Wound bed:See above Drainage (amount, consistency, odor) None Periwound: Profuse erythema on LLE to mid-thigh Dressing procedure/placement/frequency: Patient reports no success with mupirocin or bacitracin, reports success with lesions using silver sulfadiazine. I will provide this for patient with guidance provided for nursing to apply twice daily and cover.  Betadine swabstick application to bilateral great toenails will enhance drying and autolytic debridement/removal as well as an antimicrobial environment. Patient will wear non-skid socks after betadine has dried.  WOC nursing team will not follow, but will remain available to this patient, the nursing and medical teams.  Please re-consult if needed. Thanks, Ladona Mow, MSN, RN, GNP, [place]  Pager# [phone]

## 2018-01-03 ENCOUNTER — Inpatient Hospital Stay (HOSPITAL_COMMUNITY): Payer: BLUE CROSS/BLUE SHIELD

## 2018-01-03 DIAGNOSIS — L0390 Cellulitis, unspecified: Secondary | ICD-10-CM

## 2018-01-03 DIAGNOSIS — I2510 Atherosclerotic heart disease of native coronary artery without angina pectoris: Secondary | ICD-10-CM

## 2018-01-03 LAB — COMPREHENSIVE METABOLIC PANEL
ALT: 34 U/L (ref 14–54)
AST: 30 U/L (ref 15–41)
Albumin: 2.8 g/dL — ABNORMAL LOW (ref 3.5–5.0)
Alkaline Phosphatase: 52 U/L (ref 38–126)
Anion gap: 12 (ref 5–15)
BUN: 9 mg/dL (ref 6–20)
CO2: 23 mmol/L (ref 22–32)
Calcium: 8.4 mg/dL — ABNORMAL LOW (ref 8.9–10.3)
Chloride: 103 mmol/L (ref 101–111)
Creatinine, Ser: 0.87 mg/dL (ref 0.44–1.00)
GFR calc Af Amer: >60 mL/min (ref >60)
GFR calc non Af Amer: >60 mL/min (ref >60)
Glucose, Bld: 194 mg/dL — ABNORMAL HIGH (ref 65–99)
Potassium: 4 mmol/L (ref 3.5–5.1)
Sodium: 138 mmol/L (ref 135–145)
Total Bilirubin: 0.3 mg/dL (ref 0.3–1.2)
Total Protein: 5.6 g/dL — ABNORMAL LOW (ref 6.5–8.1)

## 2018-01-03 LAB — CBC
HCT: 36 % (ref 36.0–46.0)
Hemoglobin: 11.4 g/dL — ABNORMAL LOW (ref 12.0–15.0)
MCH: 29.1 pg (ref 26.0–34.0)
MCHC: 31.7 g/dL (ref 30.0–36.0)
MCV: 91.8 fL (ref 78.0–100.0)
Platelets: 161 10*3/uL (ref 150–400)
RBC: 3.92 MIL/uL (ref 3.87–5.11)
RDW: 14.4 % (ref 11.5–15.5)
WBC: 7.4 10*3/uL (ref 4.0–10.5)

## 2018-01-03 LAB — GLUCOSE, CAPILLARY
Glucose-Capillary: 183 mg/dL — ABNORMAL HIGH (ref 65–99)
Glucose-Capillary: 262 mg/dL — ABNORMAL HIGH (ref 65–99)
Glucose-Capillary: 242 mg/dL — ABNORMAL HIGH (ref 65–99)

## 2018-01-03 MED ORDER — INSULIN REGULAR HUMAN (CONC) 500 UNIT/ML ~~LOC~~ SOPN
5.0000 [IU] | PEN_INJECTOR | Freq: Three times a day (TID) | SUBCUTANEOUS | Status: DC
  Administered 2018-01-03 – 2018-01-06 (×9): 5 [IU] via SUBCUTANEOUS
  Filled 2018-01-03: qty 3

## 2018-01-03 MED ORDER — ROTIGOTINE 2 MG/24HR TD PT24
1.0000 | MEDICATED_PATCH | Freq: Every day | TRANSDERMAL | Status: DC
  Administered 2018-01-03 – 2018-01-05 (×3): 1 via TRANSDERMAL

## 2018-01-03 MED ORDER — INSULIN REGULAR HUMAN (CONC) 500 UNIT/ML ~~LOC~~ SOPN
5.0000 [IU] | PEN_INJECTOR | Freq: Every day | SUBCUTANEOUS | Status: DC
  Administered 2018-01-03: 20 [IU] via SUBCUTANEOUS
  Administered 2018-01-04: 15 [IU] via SUBCUTANEOUS
  Administered 2018-01-05: 5 [IU] via SUBCUTANEOUS
  Filled 2018-01-03: qty 3

## 2018-01-03 MED ORDER — INSULIN REGULAR HUMAN (CONC) 500 UNIT/ML ~~LOC~~ SOPN
15.0000 [IU] | PEN_INJECTOR | Freq: Two times a day (BID) | SUBCUTANEOUS | Status: DC
  Administered 2018-01-04: 15 [IU] via SUBCUTANEOUS
  Administered 2018-01-04 – 2018-01-05 (×2): 25 [IU] via SUBCUTANEOUS
  Administered 2018-01-05: 20 [IU] via SUBCUTANEOUS
  Administered 2018-01-06: 35 [IU] via SUBCUTANEOUS
  Administered 2018-01-06: 15 [IU] via SUBCUTANEOUS
  Filled 2018-01-03: qty 3

## 2018-01-03 MED ORDER — INSULIN ASPART 100 UNIT/ML ~~LOC~~ SOLN: 3.0000 [IU] | Freq: Three times a day (TID) | SUBCUTANEOUS | Status: DC

## 2018-01-03 NOTE — Progress Notes (Signed)
PROGRESS NOTE    [name]  FUX:[id number] DOB: 04/04/1961 DOA: 01/02/2018 PCP: Shelda Pal, DO   Brief Narrative: [name] is a 57 y.o. female with medical history significant for insulin-dependent diabetes mellitus, CAD status post stenting, mixed connective tissue disease, history of asthma, history of chronic diastolic CHF, fibromyalgia and chronic pain syndrome, hypertension, hyperlipidemia, hypothyroidism, GERD, irritable bowel syndrome, and other comorbidities who presented to the emergency room with a chief complaint of left lower extremity swelling, discomfort, and erythema with warmth.  TRH was called to admit for left lower extremity cellulitis and she was placed on IV Ancef and IV Vancomycin. Currently being worked up and treated for Cellulitis, as well as Uncontrolled Diabetes.  Assessment & Plan:   Active Problems:   Insulin dependent diabetes mellitus (HCC)   Hyperlipidemia LDL goal <70   Mixed connective tissue disease (HCC)   Hypothyroidism   Benign essential HTN   Chronic pain associated with significant psychosocial dysfunction   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HCC)   Fibromyalgia   Diabetic polyneuropathy (HCC)   Coronary artery disease involving native coronary artery of native heart without angina pectoris   Mild persistent asthma   Hyponatremia   Cellulitis   Cellulitis of lower leg  Acute Left Lower Leg Extremity Cellulitis -Admit as Inpatient to telemetry -Blood cultures x2 ordered and are pending, -ESR was 37 and CRP was 9.8 -Check venous duplex to evaluate for lower extremity DVT and still pending  -Empiric antibiotics with IVAncef and IV Vancomycin for now -Wound care consultation for small ulcers and leg; [place] nurse made recommendations  -We will also check ABIs given diminished pulses in right lower extremity -Given IV fluid hydration with normal saline at 75 mL's per hour x12 hours -If not improving will consider  imaging of the Leg and consider Infectious Disease Consultation  -WBC was 7.4 -Continue to Monitor and repeat CBC in a.m.  Lactic Acidosis, resolved -Likely from infection above; Continue to Hold Metformin -LA went from 2.2 -> 4.0 -> 1.9 -Given IV fluid hydration 75 mL's per hour times 12 hours and now stopped.    Hyponatremia/Hypochloremia -Likely from nausea and vomiting; Improved as Na+ is now 138 and Chloride is 103 -Given IV fluid hydration as above and now Stopped  -Repeat CMP in a.m.  Insulin-dependent Diabetes Mellitus Type 2 -Not on any Long Acting SSI -Held Home Metformin and U500 SSI on admission but will resume Home U500 per Diabetes Coordinator Recc's in note -Checked Hemoglobin A1c and was 8.4 -Placed on moderate NovoLog sliding scale insulin before meals and at bedtime but now discontinued now that we are using her U500 -Appreciate diabetes education coordinator assistance -CBGs have been ranged from 183-267  Renal Insuffiencey, improving  -Patient' BUN/Cr slightly elevated compared to baseline -Given IV fluid hydration as above -Avoid nephrotoxic's and hold enalapril as well as Lasix at this time -Checked Urinalysis and was unremarkable and showed no Protein -BUN/Cr went from 17/1.01 -> 9/0.87 -Repeat CMP in the morning  CAD status post stenting -Aspirin 81 mg and Brilinta 90 mg p.o. twice daily -Continue metoprolol tartrate 20 mg p.o. twice daily and hold patient's Enalapril 10 mg p.o. twice daily for now -Currently denies any chest pain  Mixed Connective Tissue Disease -Hold patient's Plaquenil for now and resume when Infection is improving   History of Asthma and Allergic Rhinitis -Currently not in Exacerbation -Continue Brio Ellipta 1 puff inhalation daily -Continue with duo nebs 3 mL's every 4 as needed  for wheezing or shortness of breath -Continue with oxygen metolazone 1 spray twice daily as needed  Fibromyalgia/Chronic pain  Syndrome -Patient takes 18 mg of p.o. Oxycodone ER twice daily; will substitute that for 15 mg Oxycodone twice daily while hospitalized and increase dose as necessary -Continue with home Gabapentin dose 400 mg p.o. twice daily with breakfast and lunch  Essential Hypertension -Continue with Metoprolol Tartrate 25 mg p.o. twice daily and held Enalapril 10 mg p.o. twice daily for now and consider resuming in AM   Hyperlipidemia -Patient is statin intolerant -Follow-up as outpatient  Hypothyroidism -Checked TSH and was 1.388 -Levothyroxine dose 100 mcg every Monday as well as 200 mcg p.o. every other day  Chronic Diastolic CHF -Continue to Hold patient's Lasix for now and resume in AM -Chest x-ray did show some borderline cardiomegaly as well as some pulmonary vascular congestion.  There is also left lung base atelectasis -Continue with metoprolol tartrate 25 mg p.o. twice daily, will hold patient's enalapril 10 mL p.o. twice daily -Checked BNP and was 21.3 -Strict I's and O's, Daily weights; Patient is +126.7 mL and Weights not done  -Appeared euvolemic on examination without Decompensation   Morbid Obesity -His BMI on admission was 54.8 -Weight loss counseling given  Mild Dyspnea -Added Duo nebs 3 mL's 3 times daily as well as every 4 as needed for shortness of breath and wheezing -IV fluids have stopped -Repeat CXR this AM showed no definite pneumonia or effusion is seen. Mild volume loss at the lung bases cannot be excluded. No definite pleural effusion is seen. Mediastinal and hilar contours are unchanged and the heart is within normal limits in size. -Not wearing any supplemental oxygen -Continue to monitor and will have Ambulatory Pulse oximetry screening done  DVT prophylaxis: Heparin 5,000 units sq q8h Code Status: FULL CODE Family Communication: No family present at bedside Disposition Plan: Remain Inpatient for current treatment and work up of Cellulitis    Consultants:   None  Procedures:  LE VENOUS DUPLEX ordered and Pending  ABI Bilaterally ordered and Pending   Antimicrobials:  Anti-infectives (From admission, onward)   Start     Dose/Rate Route Frequency Ordered Stop   01/02/18 2245  ceFAZolin (ANCEF) IVPB 2g/100 mL premix     2 g 200 mL/hr over 30 Minutes Intravenous Every 8 hours 01/02/18 2237     01/02/18 1900  vancomycin (VANCOCIN) IVPB 1000 mg/200 mL premix     1,000 mg 200 mL/hr over 60 Minutes Intravenous Every 12 hours 01/02/18 1357     01/02/18 1515  ceFAZolin (ANCEF) IVPB 1 g/50 mL  Status:  Discontinued     2 g 200 mL/hr over 30 Minutes Intravenous Every 8 hours 01/02/18 1514 01/02/18 2237   01/02/18 1444  ceFAZolin (ANCEF) 1 g injection    Note to Pharmacy:  Amedeo Gory   : cabinet override      01/02/18 1444 01/02/18 1854   01/02/18 1400  ceFAZolin (ANCEF) IVPB 2g/100 mL premix  Status:  Discontinued     2 g 200 mL/hr over 30 Minutes Intravenous Every 8 hours 01/02/18 1357 01/02/18 1515   01/02/18 0345  vancomycin (VANCOCIN) IVPB 1000 mg/200 mL premix     1,000 mg 200 mL/hr over 60 Minutes Intravenous  Once 01/02/18 0336 01/02/18 0751   01/02/18 0300  ceFAZolin (ANCEF) IVPB 1 g/50 mL premix     1 g 100 mL/hr over 30 Minutes Intravenous  Once 01/02/18 0254 01/02/18 4967  Subjective: Seen examined at bedside today and she was in the chair.  States her leg was more painful today and feels that the erythema has not really improved very much.  No back pain today.  Discussed with her about her insulin regimen if she wants to use her U500 home dose.  Objective: Vitals:   01/03/18 0056 01/03/18 0509 01/03/18 0849 01/03/18 1325  BP:  (!) 149/76  138/63  Pulse:  92 78 87  Resp:  '16 18 16  '$ Temp:  99 F (37.2 C)  98.7 F (37.1 C)  TempSrc:  Oral  Oral  SpO2: 96% (!) 67% 97% 97%  Weight:      Height:        Intake/Output Summary (Last 24 hours) at 01/03/2018 1329 Last data filed at 01/03/2018 5681 Gross  per 24 hour  Intake 976.67 ml  Output 2100 ml  Net -1123.33 ml   Filed Weights   01/02/18 0211  Weight: 131.5 kg (290 lb)   Examination: Physical Exam:  Constitutional: WN/WD morbidly obese Caucasian female in NAD and appears calm and comfortable sitting in chair bedside Eyes: Lids and conjunctivae normal, sclerae anicteric  ENMT: External Ears, Nose appear normal. Grossly normal hearing. Mucous membranes are moist.  Neck: Appears normal, supple, no cervical masses, normal ROM, no appreciable thyromegaly; no JVD Respiratory: Diminished to auscultation bilaterally, no wheezing, rales, rhonchi or crackles. Normal respiratory effort and patient is not tachypenic. No accessory muscle use.  Cardiovascular: RRR, no murmurs / rubs / gallops. S1 and S2 auscultated. 1+ LE edema in the Left Leg compared to Right  Abdomen: Soft, non-tender, Distended 2/2 body habitus. No masses palpated. No appreciable hepatosplenomegaly. Bowel sounds positive x4.  GU: Deferred. Musculoskeletal: LE nails are falling off and discolored. No Joing deformities and no Contractures.  Skin: Left LE Extremity was warm, swollen, and erythematous. Ha some pain to palpate. Patient had 4 small ulcers that were wrapped. N Neurologic: CN 2-12 grossly intact with no focal deficits. Romberg sign and cerebellar reflexes not assessed.  Psychiatric: Normal judgment and insight. Alert and oriented x 3. Normal mood and appropriate affect.   Data Reviewed: I have personally reviewed following labs and imaging studies  CBC: Recent Labs  Lab 01/02/18 0243 01/03/18 0552  WBC 10.2 7.4  NEUTROABS 7.2  --   HGB 13.5 11.4*  HCT 41.4 36.0  MCV 90.8 91.8  PLT 157 275   Basic Metabolic Panel: Recent Labs  Lab 01/02/18 0243 01/03/18 0552  NA 133* 138  K 3.9 4.0  CL 98* 103  CO2 25 23  GLUCOSE 257* 194*  BUN 17 9  CREATININE 1.01* 0.87  CALCIUM 8.8* 8.4*   GFR: Estimated Creatinine Clearance: 92.7 mL/min (by C-G formula  based on SCr of 0.87 mg/dL). Liver Function Tests: Recent Labs  Lab 01/02/18 0243 01/03/18 0552  AST 41 30  ALT 45 34  ALKPHOS 62 52  BILITOT 0.5 0.3  PROT 6.9 5.6*  ALBUMIN 3.6 2.8*   No results for input(s): LIPASE, AMYLASE in the last 168 hours. No results for input(s): AMMONIA in the last 168 hours. Coagulation Profile: No results for input(s): INR, PROTIME in the last 168 hours. Cardiac Enzymes: No results for input(s): CKTOTAL, CKMB, CKMBINDEX, TROPONINI in the last 168 hours. BNP (last 3 results) No results for input(s): PROBNP in the last 8760 hours. HbA1C: Recent Labs    01/02/18 1809  HGBA1C 8.4*   CBG: Recent Labs  Lab 01/02/18 1036 01/02/18 2201  01/03/18 0800 01/03/18 1142  GLUCAP 171* 267* 183* 262*   Lipid Profile: No results for input(s): CHOL, HDL, LDLCALC, TRIG, CHOLHDL, LDLDIRECT in the last 72 hours. Thyroid Function Tests: Recent Labs    01/02/18 1809  TSH 1.388   Anemia Panel: No results for input(s): VITAMINB12, FOLATE, FERRITIN, TIBC, IRON, RETICCTPCT in the last 72 hours. Sepsis Labs: Recent Labs  Lab 01/02/18 0251 01/02/18 1809 01/02/18 2023 01/02/18 2258  LATICACIDVEN 2.20* 2.2* 4.0* 1.9    Recent Results (from the past 240 hour(s))  MRSA PCR Screening     Status: None   Collection Time: 01/02/18  6:12 PM  Result Value Ref Range Status   MRSA by PCR NEGATIVE NEGATIVE Final    Comment:        The GeneXpert MRSA Assay (FDA approved for NASAL specimens only), is one component of a comprehensive MRSA colonization surveillance program. It is not intended to diagnose MRSA infection nor to guide or monitor treatment for MRSA infections. Performed at [facility], [place] [address] [place], [place] [address]     Radiology Studies: Dg Chest 2 View  Result Date: 01/02/2018 CLINICAL DATA:  LEFT leg cellulitis for 3 days.  Pain and swelling. EXAM: CHEST - 2 VIEW COMPARISON:  Chest radiograph October 05, 2017  FINDINGS: Cardiac silhouette is upper limits of normal in size, mediastinal silhouette is nonsuspicious. Pulmonary vascular congestion. Strandy densities LEFT lung base without pleural effusion or focal consolidation. Azygos fissure. Calcification RIGHT humeral head seen with calcific tendinopathy. Surgical clips in the included right abdomen compatible with cholecystectomy. Degenerative change of the thoracic spine and included cervical spine. Large body habitus. IMPRESSION: Borderline cardiomegaly. Pulmonary vascular congestion. LEFT lung base atelectasis. Electronically Signed   By: Elon Alas M.D.   On: 01/02/2018 03:34   Dg Chest Port 1 View  Result Date: 01/03/2018 CLINICAL DATA:  New onset of shortness of breath today EXAM: PORTABLE CHEST 1 VIEW COMPARISON:  Portable chest x-ray of 01/02/2018 FINDINGS: On this portable semi-erect film, no definite pneumonia or effusion is seen. Mild volume loss at the lung bases cannot be excluded. No definite pleural effusion is seen. Mediastinal and hilar contours are unchanged and the heart is within normal limits in size. IMPRESSION: No definite active process. Cannot exclude mild volume loss at the lung bases. Electronically Signed   By: Ivar Drape M.D.   On: 01/03/2018 11:02   Scheduled Meds: . aspirin EC  81 mg Oral Daily  . fluticasone  2 spray Each Nare Daily  . fluticasone furoate-vilanterol  1 puff Inhalation Daily  . gabapentin  400 mg Oral BID WC  . gabapentin  700 mg Oral QHS  . heparin  5,000 Units Subcutaneous Q8H  . insulin aspart  0-15 Units Subcutaneous TID WC  . insulin aspart  0-5 Units Subcutaneous QHS  . insulin aspart  3 Units Subcutaneous TID WC  . ipratropium-albuterol  3 mL Nebulization TID  . [START ON 01/07/2018] levothyroxine  100 mcg Oral Q Mon  . levothyroxine  200 mcg Oral Once per day on Sun Tue Wed Thu Fri Sat  . loratadine  10 mg Oral Daily  . metoprolol tartrate  25 mg Oral BID  . oxyCODONE  15 mg Oral Q12H  .  vitamin B-6  100 mg Oral BID  . rotigotine  1 patch Transdermal QHS  . saccharomyces boulardii  250 mg Oral BID  . silver sulfADIAZINE   Topical BID  . ticagrelor  90  mg Oral BID   Continuous Infusions: .  ceFAZolin (ANCEF) IV 2 g (01/03/18 0519)  . vancomycin Stopped (01/03/18 0926)    LOS: 1 day   Kerney Elbe, DO [facility] Pager [phone]  If 7PM-7AM, please contact night-coverage [email or web address] Password [facility] 01/03/2018, 1:29 PM

## 2018-01-03 NOTE — Progress Notes (Signed)
Dressing applied to left lower leg.

## 2018-01-03 NOTE — Progress Notes (Signed)
Inpatient Diabetes Program Recommendations  AACE/ADA: New Consensus Statement on Inpatient Glycemic Control (2015)  Target Ranges:  Prepandial:   less than 140 mg/dL      Peak postprandial:   less than 180 mg/dL (1-2 hours)      Critically ill patients:  140 - 180 mg/dL   Lab Results  Component Value Date   GLUCAP 262 (H) 01/03/2018   HGBA1C 8.4 (H) 01/02/2018    Review of Glycemic Control  Diabetes history: DM2 Outpatient Diabetes medications: U-500 5-105 units at B and L, 3-65 units at HS. Current orders for Inpatient glycemic control: Novolog 0-15 units tidwc and hs + 3 units tidwc  Spoke with pt regarding her U-500 scale. Pt states she would prefer to be on U-500 insulin here. Pharmacy to obtain pt's U-500 when ordered. Pt states she would like to be on 50% of her home dose. HgbA1C is improving per pt.  Inpatient Diabetes Program Recommendations:     D/C all Novolog orders.  U-500 insulin 3 units tidwc + s/s  U-500 s/s: (This is 50% reduction below) Breakfast and Lunch:  <100-125 - 2 units 126-150 - 18 units 201-249 - 27 units 250-300 - 32 units 301-349 - 37 units 350-400 - 42 units 401-449 - 47 units  Dinner:  <100-150 - 5 units 151-200 - 17 units 201-300 - 22 units 301-400 - 27 units 401-500 - 32 units  Discussed with RN.  Will follow closely.  Thank you. Ailene Ards, RD, LDN, CDE Inpatient Diabetes Coordinator [phone]

## 2018-01-03 NOTE — Progress Notes (Signed)
Left lower extremity venous duplex has been completed. Negative for obvious evidence of DVT.  ABI's have been completed. Right 0.98 Left 0.96  01/03/18 1:56 PM Olen Cordial RVT

## 2018-01-03 NOTE — Progress Notes (Signed)
Pharmacy Note   Per hospital policy will use hospital provided U-500 insulin pen for safety reasons. As the pen can only be dialed in increments of 5 unit, clarified with Ailene Ards, diabetes coordinator that the following doses are appropriate   Adjust scheduled dose to   5 units TID with meals   With Breakfast and Lunch  <100-125 - 0 units 126-150 - 15 units 201-249 - 25 units 250-300 - 30 units 301-349 - 35 units 350-400 - 40 units 401-449 - 45 units    Dinner:  <100-150 - 5 units 151-200 - 15 units 201-300 - 20 units 301-400 - 25 units 401-500 - 30 units  Clarified with ordering MD that doses ok to proceed with above dosing.  Adalberto Cole, PharmD, BCPS Pager [phone] 01/03/2018 1:36 PM

## 2018-01-04 ENCOUNTER — Inpatient Hospital Stay (HOSPITAL_COMMUNITY): Payer: BLUE CROSS/BLUE SHIELD

## 2018-01-04 DIAGNOSIS — L03116 Cellulitis of left lower limb: Principal | ICD-10-CM

## 2018-01-04 LAB — CBC WITH DIFFERENTIAL/PLATELET
Basophils Absolute: 0 10*3/uL (ref 0.0–0.1)
Basophils Relative: 1 %
Eosinophils Absolute: 0.2 10*3/uL (ref 0.0–0.7)
Eosinophils Relative: 3 %
HCT: 39.6 % (ref 36.0–46.0)
Hemoglobin: 12.5 g/dL (ref 12.0–15.0)
Lymphocytes Relative: 44 %
Lymphs Abs: 2.9 10*3/uL (ref 0.7–4.0)
MCH: 28.7 pg (ref 26.0–34.0)
MCHC: 31.6 g/dL (ref 30.0–36.0)
MCV: 91 fL (ref 78.0–100.0)
Monocytes Absolute: 0.6 10*3/uL (ref 0.1–1.0)
Monocytes Relative: 9 %
Neutro Abs: 2.8 10*3/uL (ref 1.7–7.7)
Neutrophils Relative %: 43 %
Platelets: 198 10*3/uL (ref 150–400)
RBC: 4.35 MIL/uL (ref 3.87–5.11)
RDW: 14.1 % (ref 11.5–15.5)
WBC: 6.5 10*3/uL (ref 4.0–10.5)

## 2018-01-04 LAB — COMPREHENSIVE METABOLIC PANEL
ALT: 39 U/L (ref 14–54)
AST: 37 U/L (ref 15–41)
Albumin: 3.2 g/dL — ABNORMAL LOW (ref 3.5–5.0)
Alkaline Phosphatase: 58 U/L (ref 38–126)
Anion gap: 12 (ref 5–15)
BUN: 8 mg/dL (ref 6–20)
CO2: 25 mmol/L (ref 22–32)
Calcium: 9.2 mg/dL (ref 8.9–10.3)
Chloride: 102 mmol/L (ref 101–111)
Creatinine, Ser: 0.77 mg/dL (ref 0.44–1.00)
GFR calc Af Amer: >60 mL/min (ref >60)
GFR calc non Af Amer: >60 mL/min (ref >60)
Glucose, Bld: 217 mg/dL — ABNORMAL HIGH (ref 65–99)
Potassium: 4.1 mmol/L (ref 3.5–5.1)
Sodium: 139 mmol/L (ref 135–145)
Total Bilirubin: 0.7 mg/dL (ref 0.3–1.2)
Total Protein: 7 g/dL (ref 6.5–8.1)

## 2018-01-04 LAB — GLUCOSE, CAPILLARY
Glucose-Capillary: 235 mg/dL — ABNORMAL HIGH (ref 65–99)
Glucose-Capillary: 143 mg/dL — ABNORMAL HIGH (ref 65–99)
Glucose-Capillary: 172 mg/dL — ABNORMAL HIGH (ref 65–99)
Glucose-Capillary: 223 mg/dL — ABNORMAL HIGH (ref 65–99)

## 2018-01-04 LAB — MAGNESIUM: Magnesium: 1.8 mg/dL (ref 1.7–2.4)

## 2018-01-04 LAB — PHOSPHORUS: Phosphorus: 3.1 mg/dL (ref 2.5–4.6)

## 2018-01-04 MED ORDER — OXYCODONE HCL ER 20 MG PO T12A
20.0000 mg | EXTENDED_RELEASE_TABLET | Freq: Two times a day (BID) | ORAL | Status: DC
  Administered 2018-01-04 – 2018-01-06 (×4): 20 mg via ORAL
  Filled 2018-01-04 (×4): qty 1

## 2018-01-04 MED ORDER — VANCOMYCIN HCL 10 G IV SOLR
1250.0000 mg | Freq: Two times a day (BID) | INTRAVENOUS | Status: DC
  Administered 2018-01-04 – 2018-01-05 (×2): 1250 mg via INTRAVENOUS
  Filled 2018-01-04 (×2): qty 1250

## 2018-01-04 MED ORDER — FUROSEMIDE 10 MG/ML IJ SOLN
40.0000 mg | Freq: Once | INTRAMUSCULAR | Status: AC
  Administered 2018-01-04: 40 mg via INTRAVENOUS
  Filled 2018-01-04: qty 4

## 2018-01-04 MED ORDER — IOHEXOL 300 MG/ML  SOLN
100.0000 mL | Freq: Once | INTRAMUSCULAR | Status: AC | PRN
  Administered 2018-01-04: 100 mL via INTRAVENOUS

## 2018-01-04 NOTE — Progress Notes (Signed)
PROGRESS NOTE    [name]  RAQ:[id number] DOB: [birth year] DOA: 01/02/2018 PCP: Shelda Pal, DO   Brief Narrative: [name] is a 57 y.o. female with medical history significant for insulin-dependent diabetes mellitus, CAD status post stenting, mixed connective tissue disease, history of asthma, history of chronic diastolic CHF, fibromyalgia and chronic pain syndrome, hypertension, hyperlipidemia, hypothyroidism, GERD, irritable bowel syndrome, and other comorbidities who presented to the emergency room with a chief complaint of left lower extremity swelling, discomfort, and erythema with warmth.  TRH was called to admit for left lower extremity cellulitis and she was placed on IV Ancef and IV Vancomycin. Currently being worked up and treated for Cellulitis, as well as Uncontrolled Diabetes.   Assessment & Plan:   Active Problems:   Insulin dependent diabetes mellitus (HCC)   Hyperlipidemia LDL goal <70   Mixed connective tissue disease (HCC)   Hypothyroidism   Benign essential HTN   Chronic pain associated with significant psychosocial dysfunction   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HCC)   Fibromyalgia   Diabetic polyneuropathy (HCC)   Coronary artery disease involving native coronary artery of native heart without angina pectoris   Mild persistent asthma   Hyponatremia   Cellulitis   Cellulitis of lower leg  Acute Left Lower Leg Extremity Cellulitis -Admit as Inpatient to telemetry -Blood cultures x2 ordered and are pending, -ESR was 37 and CRP was 9.8 -Checked venous duplex to evaluate for lower extremity DVT and was Negative  -Empiric antibiotics with IV Ancef and IV Vancomycin for now and de-escalate as seen for -Wound care consultation for small ulcers and leg; [facility] nurse made recommendations  -ABI's done and Right was 0.98 and Left was 0.96 -Given IV fluid hydration with normal saline at 75 mL's per hour x12 hours no IV fluid hydration stopped.   Patient has been given IV Lasix for her left leg swelling -We will obtain a CT of the leg with contrast to evaluate for abscesses -WBC was 6.5 -Continue to Monitor and repeat CBC in a.m. -Discussed with Dr. Linus Salmons of ID briefly and will obtain CT of the leg prior to formal consultation if necessary  Lactic Acidosis, resolved -Likely from infection above; Continue to Hold Metformin -LA went from 2.2 -> 4.0 -> 1.9 -Given IV fluid hydration 75 mL's per hour times 12 hours and now stopped.    Hyponatremia/Hypochloremia -Likely from nausea and vomiting; Improved as Na+ is now 138 and Chloride is 103 -Given IV fluid hydration as above and now Stopped fluid hydration.  Will be giving patient IV Lasix 40 mg today -Repeat CMP in a.m.  Insulin-dependent Diabetes Mellitus Type 2 -Not on any Long Acting SSI -Held Home Metformin and U500 SSI on admission but will resume Home U500 per Diabetes Coordinator Recc's in note -Checked Hemoglobin A1c and was 8.4 -Placed on moderate NovoLog sliding scale insulin before meals and at bedtime but now discontinued now that we are using her U500 -Appreciate diabetes education coordinator assistance -CBGs have been ranged from 143-242  Renal Insuffiencey, improving  -Patient' BUN/Cr slightly elevated compared to baseline -Given IV fluid hydration as above -Avoid nephrotoxic's and hold enalapril as well as Lasix at this time -Checked Urinalysis and was unremarkable and showed no Protein -BUN/Cr went from 17/1.01 -> 9/0.87 -> 8/0.77 -Repeat CMP in the morning  CAD status post stenting -Aspirin 81 mg and Brilinta 90 mg p.o. twice daily -Continue metoprolol tartrate 20 mg p.o. twice daily and hold patient's Enalapril 10 mg p.o.  twice daily for now -Currently denies any chest pain  Mixed Connective Tissue Disease -Hold patient's Plaquenil for now and resume when Infection is improving   History of Asthma and Allergic Rhinitis -Currently not in  Exacerbation -Continue Brio Ellipta 1 puff inhalation daily -Continue with duo nebs 3 mL's every 4 as needed for wheezing or shortness of breath -Continue with oxygen metolazone 1 spray twice daily as needed  Fibromyalgia/Chronic pain Syndrome -Patient takes 18 mg of p.o. Oxycodone ER twice daily; Substituted that for 15 mg Oxycodone twice daily while hospitalized and increase dose to 20 mg po BID -Continue with home Gabapentin dose 400 mg p.o. twice daily with breakfast and lunch  Essential Hypertension -Continue with Metoprolol Tartrate 25 mg p.o. twice daily and held Enalapril 10 mg p.o. twice daily for now and consider resuming in AM   Hyperlipidemia -Patient is statin intolerant -Follow-up as outpatient  Hypothyroidism -Checked TSH and was 1.388 -Levothyroxine dose 100 mcg every Monday as well as 200 mcg p.o. every other day  Chronic Diastolic CHF -Continue to Hold patient's po Lasix for now and resume in AM -Chest x-ray did show some borderline cardiomegaly as well as some pulmonary vascular congestion.  There is also left lung base atelectasis -Continue with metoprolol tartrate 25 mg p.o. twice daily, will hold patient's enalapril 10 mL p.o. twice daily -Checked BNP and was 21.3 -Strict I's and O's, Daily weights; Patient is +1,496 mL and Weights not done  -Appeared slightly volume overloaded so will give IV Lasix today   Morbid Obesity -Patient's BMI on admission was 54.8 -Weight loss counseling given  Mild Dyspnea -Added Duo nebs 3 mL's 3 times daily as well as every 4 as needed for shortness of breath and wheezing -IV fluids have stopped -Repeat CXR 01/03/18 showed no definite pneumonia or effusion is seen. Mild volume loss at the lung bases cannot be excluded. No definite pleural effusion is seen. Mediastinal and hilar contours are unchanged and the heart is within normal limits in size. -Not wearing any supplemental oxygen but stated she felt worse today -Will  try empiric IV Lasix 40 mg once -Continue to monitor and will have Ambulatory Pulse oximetry screening done  DVT prophylaxis: Heparin 5,000 units sq q8h Code Status: FULL CODE Family Communication: This with husband at bedside Disposition Plan: Remain Inpatient for current treatment and work up of Cellulitis   Consultants:   None  Procedures:  LE VENOUS DUPLEX  -Negative for obvious evidence of DVT.  ABI Bilaterally ordered and showed Right was 0.98 and Left was 0.96   Antimicrobials:  Anti-infectives (From admission, onward)   Start     Dose/Rate Route Frequency Ordered Stop   01/04/18 1800  vancomycin (VANCOCIN) 1,250 mg in sodium chloride 0.9 % 250 mL IVPB     1,250 mg 166.7 mL/hr over 90 Minutes Intravenous Every 12 hours 01/04/18 1310     01/02/18 2245  ceFAZolin (ANCEF) IVPB 2g/100 mL premix     2 g 200 mL/hr over 30 Minutes Intravenous Every 8 hours 01/02/18 2237     01/02/18 1900  vancomycin (VANCOCIN) IVPB 1000 mg/200 mL premix  Status:  Discontinued     1,000 mg 200 mL/hr over 60 Minutes Intravenous Every 12 hours 01/02/18 1357 01/04/18 1310   01/02/18 1515  ceFAZolin (ANCEF) IVPB 1 g/50 mL  Status:  Discontinued     2 g 200 mL/hr over 30 Minutes Intravenous Every 8 hours 01/02/18 1514 01/02/18 2237   01/02/18 1444  ceFAZolin (ANCEF) 1 g injection    Note to Pharmacy:  Amedeo Gory   : cabinet override      01/02/18 1444 01/02/18 1854   01/02/18 1400  ceFAZolin (ANCEF) IVPB 2g/100 mL premix  Status:  Discontinued     2 g 200 mL/hr over 30 Minutes Intravenous Every 8 hours 01/02/18 1357 01/02/18 1515   01/02/18 0345  vancomycin (VANCOCIN) IVPB 1000 mg/200 mL premix     1,000 mg 200 mL/hr over 60 Minutes Intravenous  Once 01/02/18 0336 01/02/18 0751   01/02/18 0300  ceFAZolin (ANCEF) IVPB 1 g/50 mL premix     1 g 100 mL/hr over 30 Minutes Intravenous  Once 01/02/18 0254 01/02/18 8270     Subjective: Seen examined at bedside today and stated that the leg erythema  looks better however is more swollen and stated that she actually had more pain today.  No nausea, or vomitin however did feel dyspneic and had episode of hyperventilation.  No chest pain.  No other concerns or complaints at this time  Objective: Vitals:   01/03/18 2048 01/04/18 0513 01/04/18 0945 01/04/18 1432  BP:  135/81 118/72 136/84  Pulse:  83 88 85  Resp:  '17 18 16  '$ Temp:  (!) 97.4 F (36.3 C) 98.3 F (36.8 C) 97.9 F (36.6 C)  TempSrc:  Oral Oral Oral  SpO2: 95% 97% 98% 97%  Weight:      Height:        Intake/Output Summary (Last 24 hours) at 01/04/2018 2015 Last data filed at 01/04/2018 1844 Gross per 24 hour  Intake 1370 ml  Output -  Net 1370 ml   Filed Weights   01/02/18 0211  Weight: 131.5 kg (290 lb)   Examination: Physical Exam:  Constitutional: Well-nourished, well-developed morbidly obese Caucasian female who is currently in no acute distress however she is complaining of leg pain today and states that it looks more swollen but the redness has improved. Eyes: Lids and conjunctive are normal.  Sclerae nonicteric ENMT: Nose appears normal.  Grossly normal hearing.  Mucous members are moist Neck: Supple with no JVD Respiratory: Diminished to auscultation bilaterally.  No appreciable wheezing, rales, rhonchi.  Normal respiratory effort and patient is not tachypneic using accessory muscles to breathe Cardiovascular: Regular rhythm.  No murmurs, rubs, gallops.  2+ lower extremity edema in the left leg compared to the right Abdomen: Soft, nontender, distended secondary to body habitus.  Bowel sounds present in all 4 quadrants GU: Deferred Musculoskeletal: No contractures, no cyanosis. lower extremity nails are falling off and discolored. Skin: Lower left extremity swelling. Erythema is improving however it was painful to palpation.  Has small ulcers that are improving.  However the leg was more swollen today and had some blistering proximally Neurologic: Cranial  nerves II through XII grossly intact with no appreciable focal deficits Psychiatric: Normal mood and affect.  Intact judgment intact.  Awake alert and oriented x3  Data Reviewed: I have personally reviewed following labs and imaging studies  CBC: Recent Labs  Lab 01/02/18 0243 01/03/18 0552 01/04/18 0549  WBC 10.2 7.4 6.5  NEUTROABS 7.2  --  2.8  HGB 13.5 11.4* 12.5  HCT 41.4 36.0 39.6  MCV 90.8 91.8 91.0  PLT 157 161 786   Basic Metabolic Panel: Recent Labs  Lab 01/02/18 0243 01/03/18 0552 01/04/18 0549  NA 133* 138 139  K 3.9 4.0 4.1  CL 98* 103 102  CO2 '25 23 25  '$ GLUCOSE 257* 194*  217*  BUN '17 9 8  '$ CREATININE 1.01* 0.87 0.77  CALCIUM 8.8* 8.4* 9.2  MG  --   --  1.8  PHOS  --   --  3.1   GFR: Estimated Creatinine Clearance: 100.8 mL/min (by C-G formula based on SCr of 0.77 mg/dL). Liver Function Tests: Recent Labs  Lab 01/02/18 0243 01/03/18 0552 01/04/18 0549  AST 41 30 37  ALT 45 34 39  ALKPHOS 62 52 58  BILITOT 0.5 0.3 0.7  PROT 6.9 5.6* 7.0  ALBUMIN 3.6 2.8* 3.2*   No results for input(s): LIPASE, AMYLASE in the last 168 hours. No results for input(s): AMMONIA in the last 168 hours. Coagulation Profile: No results for input(s): INR, PROTIME in the last 168 hours. Cardiac Enzymes: No results for input(s): CKTOTAL, CKMB, CKMBINDEX, TROPONINI in the last 168 hours. BNP (last 3 results) No results for input(s): PROBNP in the last 8760 hours. HbA1C: Recent Labs    01/02/18 1809  HGBA1C 8.4*   CBG: Recent Labs  Lab 01/03/18 1142 01/03/18 1651 01/04/18 0728 01/04/18 1141 01/04/18 1647  GLUCAP 262* 242* 235* 143* 172*   Lipid Profile: No results for input(s): CHOL, HDL, LDLCALC, TRIG, CHOLHDL, LDLDIRECT in the last 72 hours. Thyroid Function Tests: Recent Labs    01/02/18 1809  TSH 1.388   Anemia Panel: No results for input(s): VITAMINB12, FOLATE, FERRITIN, TIBC, IRON, RETICCTPCT in the last 72 hours. Sepsis Labs: Recent Labs  Lab  01/02/18 0251 01/02/18 1809 01/02/18 2023 01/02/18 2258  LATICACIDVEN 2.20* 2.2* 4.0* 1.9    Recent Results (from the past 240 hour(s))  Culture, blood (routine x 2)     Status: None (Preliminary result)   Collection Time: 01/02/18  5:51 PM  Result Value Ref Range Status   Specimen Description   Final    BLOOD RIGHT FOREARM Performed at [facility], [place] [address], [place], [place] [address]    Special Requests   Final    BOTTLES DRAWN AEROBIC ONLY Blood Culture adequate volume Performed at [facility] [address] [place], [place] [address]    Culture   Final    NO GROWTH 2 DAYS Performed at [facility] [address] [place], [place] [address]    Report Status PENDING  Incomplete  Culture, blood (routine x 2)     Status: None (Preliminary result)   Collection Time: 01/02/18  5:51 PM  Result Value Ref Range Status   Specimen Description   Final    BLOOD RIGHT ARM Performed at [facility] [address], [place], [place] [address]    Special Requests   Final    BOTTLES DRAWN AEROBIC AND ANAEROBIC Blood Culture adequate volume Performed at [facility] [address], [place], [place] [address]    Culture   Final    NO GROWTH 2 DAYS Performed at [facility] [address] [place],  [address]    Report Status PENDING  Incomplete  MRSA PCR Screening     Status: None   Collection Time: 01/02/18  6:12 PM  Result Value Ref Range Status   MRSA by PCR NEGATIVE NEGATIVE Final    Comment:        The GeneXpert MRSA Assay (FDA approved for NASAL specimens only), is one component of a comprehensive MRSA colonization surveillance program. It is not intended to diagnose MRSA infection nor to guide or monitor treatment for MRSA infections. Performed  at [facility], [place] [address] [place], [place] [address]     Radiology Studies: Dg Chest Port 1  View  Result Date: 01/03/2018 CLINICAL DATA:  New onset of shortness of breath today EXAM: PORTABLE CHEST 1 VIEW COMPARISON:  Portable chest x-ray of 01/02/2018 FINDINGS: On this portable semi-erect film, no definite pneumonia or effusion is seen. Mild volume loss at the lung bases cannot be excluded. No definite pleural effusion is seen. Mediastinal and hilar contours are unchanged and the heart is within normal limits in size. IMPRESSION: No definite active process. Cannot exclude mild volume loss at the lung bases. Electronically Signed   By: Ivar Drape M.D.   On: 01/03/2018 11:02   Ct Extremity Lower Left W Contrast  Result Date: 01/04/2018 CLINICAL DATA:  Pain, erythema and swelling with cellulitis of the left lower extremity. EXAM: CT OF THE LOWER LEFT EXTREMITY WITH CONTRAST TECHNIQUE: Multidetector CT imaging of the lower left extremity was performed according to the standard protocol following intravenous contrast administration. COMPARISON:  None. CONTRAST:  144m OMNIPAQUE IOHEXOL 300 MG/ML  SOLN FINDINGS: Bones/Joint/Cartilage No acute abnormalities of the bones of the left lower extremity. Slight arthritic changes between the bases of the second and third metacarpals. No visible osteomyelitis. No knee or ankle effusions. Slight medial joint space narrowing at the left knee. Ligaments Suboptimally assessed by CT. The cruciate and collateral ligaments at the left knee appear normal. Ligaments at the left ankle are not well enough seen for evaluation. Muscles and Tendons Normal. Soft tissues Nonspecific subcutaneous edema in the left lower leg and on the dorsum of the foot and to a lesser degree in the distal left calf. No visible venous thrombosis. Arterial calcifications in the left leg. Focal area of probable scarring and skin retraction in the lateral aspect of the left thigh, best seen on image 62 of series 9 and image 202 of series 6. IMPRESSION: 1. Nonspecific subcutaneous edema in the left  lower extremity. This could represent cellulitis. However, there is no abscess, osteomyelitis, or joint effusion. 2. Slight arthritic changes of the medial aspect of the right knee. Electronically Signed   By: JLorriane ShireM.D.   On: 01/04/2018 16:52   Scheduled Meds: . aspirin EC  81 mg Oral Daily  . fluticasone  2 spray Each Nare Daily  . fluticasone furoate-vilanterol  1 puff Inhalation Daily  . gabapentin  400 mg Oral BID WC  . gabapentin  700 mg Oral QHS  . heparin  5,000 Units Subcutaneous Q8H  . insulin regular human CONCENTRATED  15-45 Units Subcutaneous BID WC  . insulin regular human CONCENTRATED  5 Units Subcutaneous TID WC  . insulin regular human CONCENTRATED  5-30 Units Subcutaneous Q supper  . ipratropium-albuterol  3 mL Nebulization TID  . [START ON 01/07/2018] levothyroxine  100 mcg Oral Q Mon  . levothyroxine  200 mcg Oral Once per day on Sun Tue Wed Thu Fri Sat  . loratadine  10 mg Oral Daily  . metoprolol tartrate  25 mg Oral BID  . oxyCODONE  15 mg Oral Q12H  . vitamin B-6  100 mg Oral BID  . rotigotine  1 patch Transdermal QHS  . saccharomyces boulardii  250 mg Oral BID  . silver sulfADIAZINE   Topical BID  . ticagrelor  90 mg Oral BID   Continuous Infusions: .  ceFAZolin (ANCEF) IV Stopped (01/04/18 1537)  . vancomycin 1,250 mg (01/04/18 1831)    LOS: 2 days  Kerney Elbe, DO [facility] Pager [phone]  If 7PM-7AM, please contact night-coverage [email or web address] Password [facility] 01/04/2018, 8:15 PM

## 2018-01-04 NOTE — Progress Notes (Signed)
Pharmacy Antibiotic Note  [name] is a 57 y.o. female admitted on 01/02/2018 with cellulitis.  Pharmacy has been consulted for cefazolin and vancomycin dosing.     Plan: -Increase vancomycin to 1250 mg  every 12 hours -goal trough 15-20 mcg/mL  -cefazolin 2G every 8 hours  -monitor clinical progression, length of therapy, renal function, and vancomycin trough as needed   Height: 5\' 1"  (154.9 cm) Weight: 290 lb (131.5 kg) IBW/kg (Calculated) : 47.8  Temp (24hrs), Avg:98.2 F (36.8 C), Min:97.4 F (36.3 C), Max:98.7 F (37.1 C)  Recent Labs  Lab 01/02/18 0243 01/02/18 0251 01/02/18 1809 01/02/18 2023 01/02/18 2258 01/03/18 0552 01/04/18 0549  WBC 10.2  --   --   --   --  7.4 6.5  CREATININE 1.01*  --   --   --   --  0.87 0.77  LATICACIDVEN  --  2.20* 2.2* 4.0* 1.9  --   --     Estimated Creatinine Clearance: 100.8 mL/min (by C-G formula based on SCr of 0.77 mg/dL).    Allergies  Allergen Reactions  . Fish Allergy Anaphylaxis    INCLUDES OMEGA 3 OILS  . Fish Oil Anaphylaxis and Swelling    THROAT SWELLS INCLUDES FISH AS A CLASS  . Fish Oil [Omega-3 Fatty Acids] Swelling    Throat swelling   . Other Anaphylaxis and Other (See Comments)    UNSPECIFIED REACTION  Perch  . Crestor [Rosuvastatin] Other (See Comments)    Extreme joint pain, to this & other statins  . Gabapentin Anxiety    Anxiety on high doses  . Lovastatin Other (See Comments)    EXTREME JOINT PAIN  . Monascus Purpureus Went Yeast Other (See Comments)    Muscle pain and severe joint pain.   . Red Yeast Rice [Cholestin] Other (See Comments)    Muscle pain and severe joint pain.   . Metronidazole Nausea Only and Swelling    Nausea and vomiting   . Neupro [Rotigotine]     Extreme edema   . Shellfish Allergy Other (See Comments)    UNSPECIFIED REACTION   . Suprep [Na Sulfate-K Sulfate-Mg Sulf] Nausea And Vomiting  . Tape Other (See Comments)    Electrodes causes skin breakdown     Antimicrobials this admission: Cefazolin 4/24  >>  Vancomycin 4/24 >>   Dose adjustments this admission: 4/26: Inc vanc to 1250 q12h with continued improved renal function and high TBW  Microbiology results:  4/24 BCx: sent 4/24 MRSA PCR: negative  Thank you for allowing pharmacy to be a part of this patient's care.  5/24, PharmD, BCPS Pager [phone] 01/04/2018 1:14 PM

## 2018-01-05 LAB — CBC WITH DIFFERENTIAL/PLATELET
Basophils Absolute: 0 10*3/uL (ref 0.0–0.1)
Basophils Relative: 1 %
Eosinophils Absolute: 0.2 10*3/uL (ref 0.0–0.7)
Eosinophils Relative: 2 %
HCT: 40.6 % (ref 36.0–46.0)
Hemoglobin: 13 g/dL (ref 12.0–15.0)
Lymphocytes Relative: 35 %
Lymphs Abs: 2.6 10*3/uL (ref 0.7–4.0)
MCH: 29.1 pg (ref 26.0–34.0)
MCHC: 32 g/dL (ref 30.0–36.0)
MCV: 90.8 fL (ref 78.0–100.0)
Monocytes Absolute: 0.5 10*3/uL (ref 0.1–1.0)
Monocytes Relative: 6 %
Neutro Abs: 4.1 10*3/uL (ref 1.7–7.7)
Neutrophils Relative %: 56 %
Platelets: 241 10*3/uL (ref 150–400)
RBC: 4.47 MIL/uL (ref 3.87–5.11)
RDW: 14 % (ref 11.5–15.5)
WBC: 7.4 10*3/uL (ref 4.0–10.5)

## 2018-01-05 LAB — COMPREHENSIVE METABOLIC PANEL
ALT: 47 U/L (ref 14–54)
AST: 50 U/L — ABNORMAL HIGH (ref 15–41)
Albumin: 3.4 g/dL — ABNORMAL LOW (ref 3.5–5.0)
Alkaline Phosphatase: 62 U/L (ref 38–126)
Anion gap: 15 (ref 5–15)
BUN: 10 mg/dL (ref 6–20)
CO2: 24 mmol/L (ref 22–32)
Calcium: 9 mg/dL (ref 8.9–10.3)
Chloride: 96 mmol/L — ABNORMAL LOW (ref 101–111)
Creatinine, Ser: 0.89 mg/dL (ref 0.44–1.00)
GFR calc Af Amer: >60 mL/min (ref >60)
GFR calc non Af Amer: >60 mL/min (ref >60)
Glucose, Bld: 275 mg/dL — ABNORMAL HIGH (ref 65–99)
Potassium: 4 mmol/L (ref 3.5–5.1)
Sodium: 135 mmol/L (ref 135–145)
Total Bilirubin: 0.5 mg/dL (ref 0.3–1.2)
Total Protein: 7.1 g/dL (ref 6.5–8.1)

## 2018-01-05 LAB — GLUCOSE, CAPILLARY
Glucose-Capillary: 241 mg/dL — ABNORMAL HIGH (ref 65–99)
Glucose-Capillary: 188 mg/dL — ABNORMAL HIGH (ref 65–99)
Glucose-Capillary: 130 mg/dL — ABNORMAL HIGH (ref 65–99)

## 2018-01-05 LAB — PHOSPHORUS: Phosphorus: 3.3 mg/dL (ref 2.5–4.6)

## 2018-01-05 LAB — MAGNESIUM: Magnesium: 1.7 mg/dL (ref 1.7–2.4)

## 2018-01-05 MED ORDER — GUAIFENESIN ER 600 MG PO TB12
1200.0000 mg | ORAL_TABLET | Freq: Two times a day (BID) | ORAL | Status: DC
  Administered 2018-01-05 – 2018-01-06 (×3): 1200 mg via ORAL
  Filled 2018-01-05 (×3): qty 2

## 2018-01-05 MED ORDER — VANCOMYCIN HCL 10 G IV SOLR: 1250.0000 mg | INTRAVENOUS | Status: DC

## 2018-01-05 MED ORDER — FLUTICASONE PROPIONATE 50 MCG/ACT NA SUSP
2.0000 | Freq: Two times a day (BID) | NASAL | Status: DC
  Administered 2018-01-05 – 2018-01-06 (×2): 2 via NASAL
  Filled 2018-01-05: qty 16

## 2018-01-05 MED ORDER — FUROSEMIDE 10 MG/ML IJ SOLN
40.0000 mg | Freq: Two times a day (BID) | INTRAMUSCULAR | Status: DC
  Administered 2018-01-05 – 2018-01-06 (×3): 40 mg via INTRAVENOUS
  Filled 2018-01-05 (×3): qty 4

## 2018-01-05 NOTE — Progress Notes (Signed)
Pharmacy Antibiotic Note  [name] is a 57 y.o. female admitted on 01/02/2018 with cellulitis.  Pharmacy has been consulted for cefazolin and vancomycin dosing.     Plan: - Adjust vancomycin to 1250 mg q24h for estimated AUC 426 (goal 400-500) - Continue cefazolin 2G every 8 hours - Monitor clinical progression, length of therapy, renal function, and vancomycin trough as needed   Height: 5\' 1"  (154.9 cm) Weight: 290 lb (131.5 kg) IBW/kg (Calculated) : 47.8  Temp (24hrs), Avg:98 F (36.7 C), Min:97.5 F (36.4 C), Max:98.4 F (36.9 C)  Recent Labs  Lab 01/02/18 0243 01/02/18 0251 01/02/18 1809 01/02/18 2023 01/02/18 2258 01/03/18 0552 01/04/18 0549 01/05/18 1016  WBC 10.2  --   --   --   --  7.4 6.5 7.4  CREATININE 1.01*  --   --   --   --  0.87 0.77  --   LATICACIDVEN  --  2.20* 2.2* 4.0* 1.9  --   --   --     Estimated Creatinine Clearance: 100.8 mL/min (by C-G formula based on SCr of 0.77 mg/dL).    Allergies  Allergen Reactions  . Fish Allergy Anaphylaxis    INCLUDES OMEGA 3 OILS  . Fish Oil Anaphylaxis and Swelling    THROAT SWELLS INCLUDES FISH AS A CLASS  . Fish Oil [Omega-3 Fatty Acids] Swelling    Throat swelling   . Other Anaphylaxis and Other (See Comments)    UNSPECIFIED REACTION  Perch  . Crestor [Rosuvastatin] Other (See Comments)    Extreme joint pain, to this & other statins  . Gabapentin Anxiety    Anxiety on high doses  . Lovastatin Other (See Comments)    EXTREME JOINT PAIN  . Monascus Purpureus Went Yeast Other (See Comments)    Muscle pain and severe joint pain.   . Red Yeast Rice [Cholestin] Other (See Comments)    Muscle pain and severe joint pain.   . Metronidazole Nausea Only and Swelling    Nausea and vomiting   . Neupro [Rotigotine]     Extreme edema   . Shellfish Allergy Other (See Comments)    UNSPECIFIED REACTION   . Suprep [Na Sulfate-K Sulfate-Mg Sulf] Nausea And Vomiting  . Tape Other (See Comments)    Electrodes  causes skin breakdown    Antimicrobials this admission: Cefazolin 4/24  >>  Vancomycin 4/24 >>   Dose adjustments this admission: 4/26: Inc vanc to 1250 q12h with continued improved renal function and high TBW 4/27: Adjust vanc to 1250mg  q24h for goal AUC 400-500  Microbiology results:  4/24 BCx: ngtd 4/24 MRSA PCR: negative  Thank you for allowing pharmacy to be a part of this patient's care.  5/24, PharmD, BCPS Pager: [phone] 01/05/2018 10:49 AM

## 2018-01-05 NOTE — Progress Notes (Signed)
PROGRESS NOTE    Valor [place]  FIE:[id number] DOB: 08-Jul-1961 DOA: 01/02/2018 PCP: Shelda Pal, DO   Brief Narrative: [name] is a 57 y.o. female with medical history significant for insulin-dependent diabetes mellitus, CAD status post stenting, mixed connective tissue disease, history of asthma, history of chronic diastolic CHF, fibromyalgia and chronic pain syndrome, hypertension, hyperlipidemia, hypothyroidism, GERD, irritable bowel syndrome, and other comorbidities who presented to the emergency room with a chief complaint of left lower extremity swelling, discomfort, and erythema with warmth.  [facility] was called to admit for left lower extremity cellulitis and she was placed on IV Ancef and IV Vancomycin. Currently being worked up and treated for Cellulitis, as well as Uncontrolled Diabetes. Cellulitis is slowly improving at this time.  Assessment & Plan:   Active Problems:   Insulin dependent diabetes mellitus (HCC)   Hyperlipidemia LDL goal <70   Mixed connective tissue disease (HCC)   Hypothyroidism   Benign essential HTN   Chronic pain associated with significant psychosocial dysfunction   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HCC)   Fibromyalgia   Diabetic polyneuropathy (HCC)   Coronary artery disease involving native coronary artery of native heart without angina pectoris   Mild persistent asthma   Hyponatremia   Cellulitis   Cellulitis of lower leg  Acute Left Lower Leg Extremity Cellulitis, slowly improving -Admit as Inpatient to telemetry -Blood cultures x2 no growth today at 3 days -ESR was 37 and CRP was 9.8 -Checked venous duplex to evaluate for lower extremity DVT and was Negative  -Empiric antibiotics with IV Ancef.  Is continued IV Vancomycin for now and de-escalate further based on improvement -Wound care consultation for small ulcers and leg; [facility] nurse made recommendations  -ABI's done and Right was 0.98 and Left was 0.96 -Given IV fluid  hydration with normal saline at 75 mL's per hour x12 hours no IV fluid hydration stopped.  Patient has been given IV Lasix for her left leg swelling and will be given 40 mg IV Lasix twice daily today -CT scan of the left lower extremity showed Nonspecific subcutaneous edema in the left lower extremity. This could represent cellulitis. However, there is no abscess, osteomyelitis, or joint effusion. Slight arthritic changes of the medial aspect of the right knee. -WBC was 7.4 -Continue to Monitor and repeat CBC in a.m. -Discussed with Dr. Linus Salmons of ID briefly and he recommended continuing current antibiotic therapy and de-escalating stopping vancomycin.  CT scan showed no abscesses so will not formally consult ID at this time  Lactic Acidosis, resolved -Likely from infection above; Continue to Hold Metformin -LA went from 2.2 -> 4.0 -> 1.9 -Given IV fluid hydration 75 mL's per hour times 12 hours and now stopped.    Hyponatremia/Hypochloremia -Likely from nausea and vomiting; Improved as Na+ is now 135 and Chloride however is 96 likely from IV Lasix -Given IV fluid hydration as above and now Stopped fluid hydration.  He was given IV Lasix 40 mg yesterday and will be given IV Lasix 40 mg twice daily today and reevaluate in the morning given her significant leg swelling -Repeat CMP in AM.  Insulin-dependent Diabetes Mellitus Type 2 -Not on any Long Acting SSI -Held Home Metformin and U500 SSI on admission but will resume Home U500 per Diabetes Coordinator Recc's in note -Checked Hemoglobin A1c and was 8.4 -Placed on moderate NovoLog sliding scale insulin before meals and at bedtime but now discontinued now that we are using her U500 -Appreciate diabetes education coordinator assistance -  CBGs have been ranged from 143-242  Renal Insuffiencey, improving  -Patient' BUN/Cr slightly elevated compared to baseline -Given IV fluid hydration as above initially and now is being diuresed. -Avoid  nephrotoxic's and hold home enalapril as well as Lasix at this time -Checked Urinalysis and was unremarkable and showed no Protein -BUN/Cr went from 17/1.01 -> 9/0.87 -> 8/0.77 -> 10/0.89 -Slight trending up now that patient was being diuresed with IV Lasix.  Continue to monitor very closely -Repeat CMP in the morning  CAD status post stenting -Aspirin 81 mg and Brilinta 90 mg p.o. twice daily -Continue metoprolol tartrate 20 mg p.o. twice daily and hold patient's Enalapril 10 mg p.o. twice daily for now -Currently denies any chest pain  Mixed Connective Tissue Disease -Hold patient's Plaquenil for now and resume when Infection is improving   History of Asthma and Allergic Rhinitis -Currently not in Exacerbation -Continue Brio Ellipta 1 puff inhalation daily -Continue with duo nebs 3 mL's every 4 as needed for wheezing or shortness of breath -Continue with Flonase 2 puffs in each nare twice daily -Continue with Oxymetazoline 1 spray twice daily as needed  Fibromyalgia/Chronic pain Syndrome -Patient takes 18 mg of p.o. Oxycodone ER twice daily; Substituted that for 15 mg Oxycodone twice daily while hospitalized and increased dose to 20 mg po BID -Continue with home Gabapentin dose 400 mg p.o. twice daily with breakfast and lunch  Essential Hypertension -Continue with Metoprolol Tartrate 25 mg p.o. twice daily and held Enalapril 10 mg p.o. twice daily General patient is now being diuresed with IV Lasix for leg swelling and consider resuming in AM   Hyperlipidemia -Patient is statin intolerant -Follow-up as outpatient  Hypothyroidism -Checked TSH and was 1.388 -Levothyroxine dose 100 mcg every Monday as well as 200 mcg p.o. every other day  Chronic Diastolic CHF -Continue to Hold patient's po Lasix for now and resume in AM -Chest x-ray did show some borderline cardiomegaly as well as some pulmonary vascular congestion.  There is also left lung base atelectasis -Continue  with metoprolol tartrate 25 mg p.o. twice daily, will hold patient's enalapril 10 mL p.o. twice daily -Checked BNP and was 21.3 -Strict I's and O's, Daily weights; Patient is +2.036 mL and Weights are the same -Likely strict I's and O's were not being done as patient has been diuresed -Appeared slightly volume overloaded so will give IV Lasix today   Morbid Obesity -Patient's BMI on admission was 54.8 -Weight loss counseling given  Mild Dyspnea, proved -Added Duo nebs 3 mL's 3 times daily as well as every 4 as needed for shortness of breath and wheezing -IV fluids have stopped -Repeat CXR 01/03/18 showed no definite pneumonia or effusion is seen. Mild volume loss at the lung bases cannot be excluded. No definite pleural effusion is seen. Mediastinal and hilar contours are unchanged and the heart is within normal limits in size. -Not wearing any supplemental oxygen but stated she felt worse today -Given Lasix once 40 mg yesterday and will repeat IV Lasix 40 minutes. twice daily for 2 doses today -Continue to monitor and will have Ambulatory Pulse Oximetry Screening done prior to discharge  DVT prophylaxis: Heparin 5,000 units sq q8h Code Status: FULL CODE Family Communication: Discussed with husband at bedside Disposition Plan: Remain Inpatient for current treatment and work up of Cellulitis   Consultants:   None  Procedures:  LE VENOUS DUPLEX  -Negative for obvious evidence of DVT.  ABI Bilaterally ordered and showed Right was 0.98 and  Left was 0.96   Antimicrobials:  Anti-infectives (From admission, onward)   Start     Dose/Rate Route Frequency Ordered Stop   01/06/18 0600  vancomycin (VANCOCIN) 1,250 mg in sodium chloride 0.9 % 250 mL IVPB  Status:  Discontinued     1,250 mg 166.7 mL/hr over 90 Minutes Intravenous Every 24 hours 01/05/18 1049 01/05/18 1116   01/04/18 1800  vancomycin (VANCOCIN) 1,250 mg in sodium chloride 0.9 % 250 mL IVPB  Status:  Discontinued     1,250  mg 166.7 mL/hr over 90 Minutes Intravenous Every 12 hours 01/04/18 1310 01/05/18 1049   01/02/18 2245  ceFAZolin (ANCEF) IVPB 2g/100 mL premix     2 g 200 mL/hr over 30 Minutes Intravenous Every 8 hours 01/02/18 2237     01/02/18 1900  vancomycin (VANCOCIN) IVPB 1000 mg/200 mL premix  Status:  Discontinued     1,000 mg 200 mL/hr over 60 Minutes Intravenous Every 12 hours 01/02/18 1357 01/04/18 1310   01/02/18 1515  ceFAZolin (ANCEF) IVPB 1 g/50 mL  Status:  Discontinued     2 g 200 mL/hr over 30 Minutes Intravenous Every 8 hours 01/02/18 1514 01/02/18 2237   01/02/18 1444  ceFAZolin (ANCEF) 1 g injection    Note to Pharmacy:  Amedeo Gory   : cabinet override      01/02/18 1444 01/02/18 1854   01/02/18 1400  ceFAZolin (ANCEF) IVPB 2g/100 mL premix  Status:  Discontinued     2 g 200 mL/hr over 30 Minutes Intravenous Every 8 hours 01/02/18 1357 01/02/18 1515   01/02/18 0345  vancomycin (VANCOCIN) IVPB 1000 mg/200 mL premix     1,000 mg 200 mL/hr over 60 Minutes Intravenous  Once 01/02/18 0336 01/02/18 0751   01/02/18 0300  ceFAZolin (ANCEF) IVPB 1 g/50 mL premix     1 g 100 mL/hr over 30 Minutes Intravenous  Once 01/02/18 0254 01/02/18 1740     Subjective: Seen examined at bedside today feels as if the leg was less swollen today.  Still erythematous but however not as much and states she thinks that the cellulitis is receding.  States also that her shortness of breath has improved.  Had some nausea and one episode of vomiting this a.m. and had a headache, however this is improved.  Objective: Vitals:   01/05/18 0820 01/05/18 1015 01/05/18 1240 01/05/18 1328  BP:  (!) 157/93 131/78   Pulse:  (!) 108 88   Resp:  18    Temp:  98.4 F (36.9 C) 97.9 F (36.6 C) 98 F (36.7 C)  TempSrc:  Oral Oral Oral  SpO2: 96% 98% 97%   Weight:      Height:        Intake/Output Summary (Last 24 hours) at 01/05/2018 1724 Last data filed at 01/05/2018 1331 Gross per 24 hour  Intake 1030 ml   Output -  Net 1030 ml   Filed Weights   01/02/18 0211  Weight: 131.5 kg (290 lb)   Examination: Physical Exam:  Constitutional: Well-nourished, well-developed morbidly obese Caucasian female who is currently in no acute distress.  She is sitting at bedside in her chair and feels like her leg is improving slowly Eyes: Sclera are anicteric; lids and conjunctive are normal. ENMT: External ears and nose appear normal.  Grossly normal hearing.  Mucous membranes appear moist Neck: Supple with no JVD Respiratory: Diminished to auscultation bilaterally.  No appreciable wheezing, rales, rhonchi.  Patient has normal respiratory effort and is  not tachypneic.  Not using any accessory muscles to breathe and it nonlabored Cardiovascular: Regular rate and rhythm.  No murmurs, rubs, gallops.  1-2+ lower extremity edema on the left leg.  Right leg has minimal edema Abdomen: Soft, nontender, distended secondary to body habitus GU: Deferred Musculoskeletal: Contractures.  No cyanosis.  Lower extremity nails have fallen off and are discolored. Skin: Left lower extremity remains swollen however not much yesterday.  Erythema is improved slightly.  Still has some pain on palpation small ulcers are improved except one spot.  Leg is erythematous and patient does have some blistering proximally but colitis is receding from the pen marks that were initiated on admission.   Neurologic: Cranial nerves II through XII grossly intact with no appreciable focal deficit. Psychiatric: Normal mood and affect.  Intact judgment insight appeared awake alert and oriented x3  Data Reviewed: I have personally reviewed following labs and imaging studies  CBC: Recent Labs  Lab 01/02/18 0243 01/03/18 0552 01/04/18 0549 01/05/18 1016  WBC 10.2 7.4 6.5 7.4  NEUTROABS 7.2  --  2.8 4.1  HGB 13.5 11.4* 12.5 13.0  HCT 41.4 36.0 39.6 40.6  MCV 90.8 91.8 91.0 90.8  PLT 157 161 198 973   Basic Metabolic Panel: Recent Labs  Lab  01/02/18 0243 01/03/18 0552 01/04/18 0549 01/05/18 1016  NA 133* 138 139 135  K 3.9 4.0 4.1 4.0  CL 98* 103 102 96*  CO2 '25 23 25 24  '$ GLUCOSE 257* 194* 217* 275*  BUN '17 9 8 10  '$ CREATININE 1.01* 0.87 0.77 0.89  CALCIUM 8.8* 8.4* 9.2 9.0  MG  --   --  1.8 1.7  PHOS  --   --  3.1 3.3   GFR: Estimated Creatinine Clearance: 90.6 mL/min (by C-G formula based on SCr of 0.89 mg/dL). Liver Function Tests: Recent Labs  Lab 01/02/18 0243 01/03/18 0552 01/04/18 0549 01/05/18 1016  AST 41 30 37 50*  ALT 45 34 39 47  ALKPHOS 62 52 58 62  BILITOT 0.5 0.3 0.7 0.5  PROT 6.9 5.6* 7.0 7.1  ALBUMIN 3.6 2.8* 3.2* 3.4*   No results for input(s): LIPASE, AMYLASE in the last 168 hours. No results for input(s): AMMONIA in the last 168 hours. Coagulation Profile: No results for input(s): INR, PROTIME in the last 168 hours. Cardiac Enzymes: No results for input(s): CKTOTAL, CKMB, CKMBINDEX, TROPONINI in the last 168 hours. BNP (last 3 results) No results for input(s): PROBNP in the last 8760 hours. HbA1C: Recent Labs    01/02/18 1809  HGBA1C 8.4*   CBG: Recent Labs  Lab 01/04/18 1647 01/04/18 2205 01/05/18 0739 01/05/18 1230 01/05/18 1703  GLUCAP 172* 223* 241* 188* 130*   Lipid Profile: No results for input(s): CHOL, HDL, LDLCALC, TRIG, CHOLHDL, LDLDIRECT in the last 72 hours. Thyroid Function Tests: Recent Labs    01/02/18 1809  TSH 1.388   Anemia Panel: No results for input(s): VITAMINB12, FOLATE, FERRITIN, TIBC, IRON, RETICCTPCT in the last 72 hours. Sepsis Labs: Recent Labs  Lab 01/02/18 0251 01/02/18 1809 01/02/18 2023 01/02/18 2258  LATICACIDVEN 2.20* 2.2* 4.0* 1.9    Recent Results (from the past 240 hour(s))  Culture, blood (routine x 2)     Status: None (Preliminary result)   Collection Time: 01/02/18  5:51 PM  Result Value Ref Range Status   Specimen Description   Final    BLOOD RIGHT FOREARM Performed at [facility], [place]  [address] [place], [place] [address]  Special Requests   Final    BOTTLES DRAWN AEROBIC ONLY Blood Culture adequate volume Performed at [place] [address] [place], [place] [address]    Culture   Final    NO GROWTH 3 DAYS Performed at [facility], [place] [address] [place], [place] [address]    Report Status PENDING  Incomplete  Culture, blood (routine x 2)     Status: None (Preliminary result)   Collection Time: 01/02/18  5:51 PM  Result Value Ref Range Status   Specimen Description   Final    BLOOD RIGHT ARM Performed at [place] [address], [place], [place] [address]    Special Requests   Final    BOTTLES DRAWN AEROBIC AND ANAEROBIC Blood Culture adequate volume Performed at [place] [address] [place], [place] [address]    Culture   Final    NO GROWTH 3 DAYS Performed at [facility], [place] [address], [place], [place] [address]    Report Status PENDING  Incomplete  MRSA PCR Screening     Status: None   Collection Time: 01/02/18  6:12 PM  Result Value Ref Range Status   MRSA by PCR NEGATIVE NEGATIVE Final    Comment:        The GeneXpert MRSA Assay (FDA approved for NASAL specimens only), is one component of a comprehensive MRSA colonization surveillance program. It is not intended to diagnose MRSA infection nor to guide or monitor treatment for MRSA infections. Performed at [facility], [place] [address], [place], [place] [address]     Radiology Studies: Ct Extremity Lower Left W Contrast  Result Date: 01/04/2018 CLINICAL DATA:  Pain, erythema and swelling with cellulitis of the left lower extremity. EXAM: CT OF THE LOWER LEFT EXTREMITY WITH CONTRAST TECHNIQUE: Multidetector CT imaging of the lower left extremity was performed according to the standard protocol following intravenous contrast administration. COMPARISON:  None. CONTRAST:  168m OMNIPAQUE  IOHEXOL 300 MG/ML  SOLN FINDINGS: Bones/Joint/Cartilage No acute abnormalities of the bones of the left lower extremity. Slight arthritic changes between the bases of the second and third metacarpals. No visible osteomyelitis. No knee or ankle effusions. Slight medial joint space narrowing at the left knee. Ligaments Suboptimally assessed by CT. The cruciate and collateral ligaments at the left knee appear normal. Ligaments at the left ankle are not well enough seen for evaluation. Muscles and Tendons Normal. Soft tissues Nonspecific subcutaneous edema in the left lower leg and on the dorsum of the foot and to a lesser degree in the distal left calf. No visible venous thrombosis. Arterial calcifications in the left leg. Focal area of probable scarring and skin retraction in the lateral aspect of the left thigh, best seen on image 62 of series 9 and image 202 of series 6. IMPRESSION: 1. Nonspecific subcutaneous edema in the left lower extremity. This could represent cellulitis. However, there is no abscess, osteomyelitis, or joint effusion. 2. Slight arthritic changes of the medial aspect of the right knee. Electronically Signed   By: JLorriane ShireM.D.   On: 01/04/2018 16:52   Scheduled Meds: . aspirin EC  81 mg Oral Daily  . fluticasone  2 spray Each Nare BID  . fluticasone furoate-vilanterol  1 puff Inhalation Daily  . furosemide  40 mg Intravenous BID  . gabapentin  400 mg Oral BID WC  . gabapentin  700 mg Oral QHS  . guaiFENesin  1,200 mg Oral BID  . heparin  5,000 Units Subcutaneous Q8H  . insulin regular human CONCENTRATED  15-45 Units Subcutaneous BID WC  . insulin regular human CONCENTRATED  5 Units Subcutaneous TID WC  . insulin regular human CONCENTRATED  5-30 Units Subcutaneous Q supper  . ipratropium-albuterol  3 mL Nebulization TID  . [START ON 01/07/2018] levothyroxine  100 mcg Oral Q Mon  . levothyroxine  200 mcg Oral Once per day on Sun Tue Wed Thu Fri Sat  . loratadine  10 mg Oral  Daily  . metoprolol tartrate  25 mg Oral BID  . oxyCODONE  20 mg Oral Q12H  . vitamin B-6  100 mg Oral BID  . rotigotine  1 patch Transdermal QHS  . saccharomyces boulardii  250 mg Oral BID  . silver sulfADIAZINE   Topical BID  . ticagrelor  90 mg Oral BID   Continuous Infusions: .  ceFAZolin (ANCEF) IV Stopped (01/05/18 1331)    LOS: 3 days   Kerney Elbe, DO [facility] Pager [phone]  If 7PM-7AM, please contact night-coverage [email or web address] Password [facility] 01/05/2018, 5:24 PM

## 2018-01-06 LAB — COMPREHENSIVE METABOLIC PANEL
ALT: 61 U/L — ABNORMAL HIGH (ref 14–54)
AST: 67 U/L — ABNORMAL HIGH (ref 15–41)
Albumin: 3.5 g/dL (ref 3.5–5.0)
Alkaline Phosphatase: 64 U/L (ref 38–126)
Anion gap: 12 (ref 5–15)
BUN: 12 mg/dL (ref 6–20)
CO2: 29 mmol/L (ref 22–32)
Calcium: 9.6 mg/dL (ref 8.9–10.3)
Chloride: 99 mmol/L — ABNORMAL LOW (ref 101–111)
Creatinine, Ser: 0.99 mg/dL (ref 0.44–1.00)
GFR calc Af Amer: >60 mL/min (ref >60)
GFR calc non Af Amer: >60 mL/min (ref >60)
Glucose, Bld: 148 mg/dL — ABNORMAL HIGH (ref 65–99)
Potassium: 4.2 mmol/L (ref 3.5–5.1)
Sodium: 140 mmol/L (ref 135–145)
Total Bilirubin: 0.7 mg/dL (ref 0.3–1.2)
Total Protein: 7.4 g/dL (ref 6.5–8.1)

## 2018-01-06 LAB — CBC WITH DIFFERENTIAL/PLATELET
Basophils Absolute: 0.1 10*3/uL (ref 0.0–0.1)
Basophils Relative: 1 %
Eosinophils Absolute: 0.3 10*3/uL (ref 0.0–0.7)
Eosinophils Relative: 4 %
HCT: 41.4 % (ref 36.0–46.0)
Hemoglobin: 13 g/dL (ref 12.0–15.0)
Lymphocytes Relative: 35 %
Lymphs Abs: 2.7 10*3/uL (ref 0.7–4.0)
MCH: 28.6 pg (ref 26.0–34.0)
MCHC: 31.4 g/dL (ref 30.0–36.0)
MCV: 91.2 fL (ref 78.0–100.0)
Monocytes Absolute: 0.6 10*3/uL (ref 0.1–1.0)
Monocytes Relative: 8 %
Neutro Abs: 4.2 10*3/uL (ref 1.7–7.7)
Neutrophils Relative %: 52 %
Platelets: 250 10*3/uL (ref 150–400)
RBC: 4.54 MIL/uL (ref 3.87–5.11)
RDW: 14.2 % (ref 11.5–15.5)
WBC: 7.9 10*3/uL (ref 4.0–10.5)

## 2018-01-06 LAB — GLUCOSE, CAPILLARY
Glucose-Capillary: 147 mg/dL — ABNORMAL HIGH (ref 65–99)
Glucose-Capillary: 262 mg/dL — ABNORMAL HIGH (ref 65–99)

## 2018-01-06 LAB — PHOSPHORUS: Phosphorus: 4.3 mg/dL (ref 2.5–4.6)

## 2018-01-06 LAB — MAGNESIUM: Magnesium: 2 mg/dL (ref 1.7–2.4)

## 2018-01-06 MED ORDER — CEPHALEXIN 500 MG PO CAPS
500.0000 mg | ORAL_CAPSULE | Freq: Three times a day (TID) | ORAL | Status: DC
  Administered 2018-01-06: 500 mg via ORAL
  Filled 2018-01-06: qty 1

## 2018-01-06 MED ORDER — CEPHALEXIN 500 MG PO CAPS: 500.0000 mg | ORAL_CAPSULE | Freq: Three times a day (TID) | ORAL | 0 refills | Status: DC

## 2018-01-06 MED ORDER — FUROSEMIDE 40 MG PO TABS: 40.0000 mg | ORAL_TABLET | Freq: Every day | ORAL | 0 refills | Status: DC | PRN

## 2018-01-06 NOTE — Discharge Summary (Signed)
Physician Discharge Summary  [name] GNF:[id number] DOB: [birth year] DOA: 01/02/2018  PCP: Shelda Pal, DO  Admit date: 01/02/2018 Discharge date: 01/06/2018  Admitted From: Home Disposition: Home  Recommendations for Outpatient Follow-up:  1. Follow up with PCP Dr. Nani Ravens in 1-2 weeks 2. Follow-up with Rheumatology as an outpatient 3. Please obtain CMP/CBC, Mag, Phos in one week 4. Please follow up on the following pending results:  Home Health: No Equipment/Devices: None  Discharge Condition: Stable   CODE STATUS: FULL CODE Diet recommendation: Heart healthy carb modified diet  Brief/Interim Summary: [name] a 57 y.o.femalewith medical history significantfor insulin-dependent diabetes mellitus, CAD status post stenting, mixed connective tissue disease, history of asthma, history of chronic diastolic CHF, fibromyalgia and chronic pain syndrome, hypertension, hyperlipidemia, hypothyroidism, GERD, irritable bowel syndrome, and other comorbidities who presented to the emergency room with a chief complaint of left lower extremity swelling,discomfort,and erythema with warmth.TRH was called to admit for left lower extremity cellulitis and she was placed on IV Ancef and IV Vancomycin. Currently being worked up and treated for Cellulitis, as well as Uncontrolled Diabetes. Cellulitis approved and she is transition to p.o. into her back with Keflex for completion of course.  She was deemed medically stable to be discharged to home and will need to follow-up with primary care physician for cellulitis check as well as rheumatology as an outpatient  Discharge Diagnoses:  Active Problems:   Insulin dependent diabetes mellitus (HCC)   Hyperlipidemia LDL goal <70   Mixed connective tissue disease (HCC)   Hypothyroidism   Benign essential HTN   Chronic pain associated with significant psychosocial dysfunction   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HCC)    Fibromyalgia   Diabetic polyneuropathy (HCC)   Coronary artery disease involving native coronary artery of native heart without angina pectoris   Mild persistent asthma   Hyponatremia   Cellulitis   Cellulitis of lower leg  AcuteLeftLowerLegExtremityCellulitis, slowly improving -Admit as Inpatient to telemetry -Blood cultures x2 no growth today at 4 days -ESR was 37 and CRP was 9.8 -Checked venous duplex to evaluate for lower extremity DVT and was Negative  -Empiric antibiotics with IV Ancef changed to po Keflex for completion of 6 days at D/C.  Discontinued IV Vancomycin yesterday  -Wound care consultation for small ulcers and leg; [place] nurse made recommendations  -ABI's done and Right was 0.98 and Left was 0.96 -Patient has been given IV Lasix for her left leg swelling and will be given 40 mg IV Lasix twice daily today prior to discharge -CT scan of the left lower extremity showed Nonspecific subcutaneous edema in the left lower extremity. This could represent cellulitis. However, there is no abscess, osteomyelitis, or joint effusion. Slight arthritic changes of the medial aspect of the right knee. -WBC was 7.9 -Continue to Monitor and repeat CBC at PCP office    LacticAcidosis, resolved -Likely from infection above; Continue to Hold Metformin -LA went from 2.2 -> 4.0 -> 1.9 -Given IV fluid hydration 75 mL's per hour times 12 hours and now stopped.    Hyponatremia/Hypochloremia -Likely from nausea and vomiting; Improved as Na+ is now 140 and Chloride however is 99 likely from IV Lasix -Repeat CMP as an outpatient  Insulin-dependentDiabetesMellitusType 2 -Not on any Long Acting SSI -Held Home Metformin and U500 SSI on admission but will resume Home U500 per Diabetes Coordinator Recc's in note -Checked Hemoglobin A1c and was 8.4 -Placed on moderate NovoLog sliding scale insulin before meals and at bedtime but now  discontinued now that we are using her U500 -Appreciate  diabetes education coordinator assistance -CBGs have been ranged from 130-262  Renal Insuffiencey, improving  -Patient' BUN/Cr slightly elevatedcompared to baseline -Given IV fluid hydration as above initially and now is being diuresed. -Avoid nephrotoxic's and hold home enalapril as well as Lasix at this time -Checked Urinalysis and was unremarkable and showed no Protein -BUN/Cr went from 17/1.01 -> 9/0.87 -> 8/0.77 -> 10/0.89 -> 12/0.99 -Slight trending up now that patient was being diuresed with IV Lasix.  Continue to monitor very closely -Repeat CMP at PCP office  CAD status post stenting -Aspirin 81 mg and Brilinta 90 mg p.o. twice daily -Continue metoprolol tartrate 20 mg p.o. twice daily and hold patient's Enalapril 10 mg p.o. twice daily for now -Currently denies any chest pain  MixedConnectiveTissueDisease -Hold patient's Plaquenil for now and resume when Infection is improving and follow-up with Rheumatology to resume  History ofAsthmaand Allergic Rhinitis -Currentlynot in Exacerbation -Continue Brio Ellipta 1 puff inhalation daily -Continue with duo nebs 3 mL's every 4 as needed for wheezing or shortness of breath -Continue with Flonase 2 puffs in each nare twice daily -Continue with Oxymetazoline 1 spray twice daily as needed  Fibromyalgia/Chronic painSyndrome -Patient takes 18 mg of p.o. Oxycodone ER twice and will continue -Continue with home Gabapentin dose 400 mg p.o. twice daily with breakfast and lunch  EssentialHypertension -Continue with Metoprolol Tartrate 25 mg p.o. twice daily and Enalapril 10 mg p.o. twice daily  -Patient received IV Lasix for leg swelling and enalapril was on hold while she is getting diuresed.  May resume at discharge  Hyperlipidemia -Patient is statin intolerant -Follow-up as outpatient to have PCP referral for a [facility] 9 inhibito  Hypothyroidism -Checked TSH and was 1.388 -Levothyroxine dose 100 mcg every Monday as  well as 200 mcg p.o. every other day  Chronic Diastolic CHF -Continued to Hold patient's po Lasix for now and cand resume at Discharge -Chest x-ray did show some borderline cardiomegaly as well as some pulmonary vascular congestion. There is also left lung base atelectasis -Continue with metoprolol tartrate 25 mg p.o. twice daily, will hold patient's enalapril 10 mL p.o. twice daily -Checked BNP and was 21.3 -Strict I's and O's, Daily weights; Patient is +3.356 mL and Weights are the same -Likely strict I's and O's were not being done as patient has been diuresed and urinated significantly per report -Appeared slightly volume overloaded so will give IV Lasix today   Morbid Obesity -Patient's BMI on admission was 54.8 -Weight loss counseling given  Mild Dyspnea, proved -Added Duo nebs 3 mL's 3 times daily as well as every 4 as needed for shortness of breath and wheezing -IV fluids have stopped -Repeat CXR 01/03/18 showed no definite pneumonia or effusion is seen. Mild volume loss at the lung bases cannot be excluded. No definite pleural effusion is seen. Mediastinal and hilar contours are unchanged and the heart is within normal limits in size. -Not wearing any supplemental oxygen but stated she felt worse today -Diuresis with IV Lasix prior to discharge lower leg swelling improved -Follow-up with PCP as an outpatient   Discharge Instructions  Discharge Instructions    Call MD for:  difficulty breathing, headache or visual disturbances   Complete by:  As directed    Call MD for:  extreme fatigue   Complete by:  As directed    Call MD for:  hives   Complete by:  As directed    Call MD  for:  persistant dizziness or light-headedness   Complete by:  As directed    Call MD for:  persistant nausea and vomiting   Complete by:  As directed    Call MD for:  redness, tenderness, or signs of infection (pain, swelling, redness, odor or green/yellow discharge around incision site)    Complete by:  As directed    Call MD for:  severe uncontrolled pain   Complete by:  As directed    Call MD for:  temperature >100.4   Complete by:  As directed    Diet - low sodium heart healthy   Complete by:  As directed    Diet Carb Modified   Complete by:  As directed    Discharge instructions   Complete by:  As directed    Follow with PCP Dr. Nani Ravens, as well as Rheumatology as an outpatient.  Take all medications as prescribed.  Have Dr. Nani Ravens reevaluate the leg within 1 week.  If symptoms change or worsen please return to the ED for evaluation   Increase activity slowly   Complete by:  As directed      Allergies as of 01/06/2018      Reactions   Fish Allergy Anaphylaxis   INCLUDES OMEGA 3 OILS   Fish Oil Anaphylaxis, Swelling   THROAT SWELLS INCLUDES FISH AS A CLASS   Fish Oil [omega-3 Fatty Acids] Swelling   Throat swelling    Other Anaphylaxis, Other (See Comments)   UNSPECIFIED REACTION  Perch   Crestor [rosuvastatin] Other (See Comments)   Extreme joint pain, to this & other statins   Gabapentin Anxiety   Anxiety on high doses   Lovastatin Other (See Comments)   EXTREME JOINT PAIN   Monascus Purpureus Went Yeast Other (See Comments)   Muscle pain and severe joint pain.    Red Yeast Rice [cholestin] Other (See Comments)   Muscle pain and severe joint pain.    Metronidazole Nausea Only, Swelling   Nausea and vomiting   Neupro [rotigotine]    Extreme edema   Shellfish Allergy Other (See Comments)   UNSPECIFIED REACTION    Suprep [na Sulfate-k Sulfate-mg Sulf] Nausea And Vomiting   Tape Other (See Comments)   Electrodes causes skin breakdown      Medication List    TAKE these medications   albuterol 108 (90 Base) MCG/ACT inhaler Commonly known as:  PROAIR HFA Inhale 2 puffs into the lungs every 4 (four) hours as needed for wheezing or shortness of breath.   aspirin EC 81 MG tablet Take 81 mg by mouth daily.   BAYER CONTOUR TEST test strip Generic  drug:  glucose blood CHECK BLOOD SUGAR 4 TIMES DAILY   betamethasone dipropionate 0.05 % cream Commonly known as:  DIPROLENE Apply 1 application topically daily as needed (eczema).   BRILINTA 90 MG Tabs tablet Generic drug:  ticagrelor TAKE ONE TABLET BY MOUTH TWICE DAILY   cephALEXin 500 MG capsule Commonly known as:  KEFLEX Take 1 capsule (500 mg total) by mouth every 8 (eight) hours for 6 days.   diphenhydrAMINE 25 MG tablet Commonly known as:  BENADRYL Take 1 tablet (25 mg total) by mouth every 6 (six) hours as needed for itching or allergies.   enalapril 10 MG tablet Commonly known as:  VASOTEC Take 1 tablet (10 mg total) 2 (two) times daily by mouth.   EPINEPHrine 0.3 mg/0.3 mL Soaj injection Commonly known as:  EPIPEN 2-PAK USE AS DIRECTED FOR SEVERE ALLERGIC  REACTION.   fluticasone 50 MCG/ACT nasal spray Commonly known as:  FLONASE USE 2 SPRAY(S) IN EACH NOSTRIL ONCE DAILY AS NEEDED FOR ALLERGIES OR  RHINITIS   fluticasone furoate-vilanterol 200-25 MCG/INH Aepb Commonly known as:  BREO ELLIPTA Inhale 1 puff into the lungs daily.   furosemide 40 MG tablet Commonly known as:  LASIX Take 1 tablet (40 mg total) by mouth daily as needed for fluid.   gabapentin 100 MG capsule Commonly known as:  NEURONTIN Take 400-700 mg by mouth See admin instructions. Pt takes '400mg'$  in the morning, '400mg'$  at lunchtime and '700mg'$  in the evening   GLUCOMANNAN PO Take 700 mg by mouth 2 (two) times daily.   HUMULIN R 500 UNIT/ML injection Generic drug:  insulin regular human CONCENTRATED Inject 0-100 Units into the skin 4 (four) times daily -  before meals and at bedtime. Breakfast and lunch <100-125= 5 units 125-150= 35 u 151-200 = 45 u 201-249 = 55 u 250 - 300 = 65 u 301 - 349 = 75 u 350 - 400 = 85 u 401 - 449 = 95 u 450 - 500 = 105 u  bedtime doses <M100 - 150 = 10 u 151-200= 35 u 201 - 300 = 45u 301- 400 = 55 u 401 - 500 = 65 u   hydroxychloroquine 200 MG  tablet Commonly known as:  PLAQUENIL Take 200 mg by mouth 2 (two) times daily.   ipratropium-albuterol 0.5-2.5 (3) MG/3ML Soln Commonly known as:  DUONEB Take 3 mLs by nebulization every 4 (four) hours as needed (SOB, wheezing).   levocetirizine 5 MG tablet Commonly known as:  XYZAL Take 1 tablet (5 mg total) by mouth every evening.   levothyroxine 200 MCG tablet Commonly known as:  SYNTHROID, LEVOTHROID Take 200 mcg by mouth daily before breakfast. Except 100 mcg on Monday   LIPO-FLAVONOID PLUS PO Take 1 tablet by mouth 3 (three) times daily.   metFORMIN 500 MG 24 hr tablet Commonly known as:  GLUCOPHAGE-XR Take 1,000 mg by mouth 2 (two) times daily.   metoprolol tartrate 25 MG tablet Commonly known as:  LOPRESSOR Take 1 tablet (25 mg total) by mouth 2 (two) times daily. Please make overdue yearly appt with Dr. Radford Pax before anymore refills 2nd attempt   milk thistle 175 MG tablet Take 175 mg by mouth 2 (two) times daily.   MUCINEX MAXIMUM STRENGTH 1200 MG Tb12 Generic drug:  Guaifenesin Take 1,200 mg by mouth 2 (two) times daily.   ondansetron 4 MG tablet Commonly known as:  ZOFRAN Take 1 tablet (4 mg total) by mouth every 8 (eight) hours as needed for nausea or vomiting.   oxymetazoline 0.05 % nasal spray Commonly known as:  AFRIN Place 1 spray into both nostrils 2 (two) times daily as needed for congestion.   PREMARIN VA Place 1 application vaginally 3 (three) times a week. On Saturdays, Mondays, and Wednesday (cream)   promethazine 25 MG tablet Commonly known as:  PHENERGAN Take 1 tablet (25 mg total) by mouth every 8 (eight) hours as needed for nausea or vomiting.   pyridoxine 100 MG tablet Commonly known as:  B-6 Take 100 mg by mouth 2 (two) times daily.   rotigotine 2 MG/24HR Commonly known as:  NEUPRO Place 1 patch onto the skin daily.   saccharomyces boulardii 250 MG capsule Commonly known as:  FLORASTOR Take 1 capsule (250 mg total) by mouth 2  (two) times daily. What changed:  additional instructions   silver sulfADIAZINE  1 % cream Commonly known as:  SILVADENE Apply 1 application topically daily.   SYSTANE ULTRA OP Apply 1 drop to eye daily as needed (dry eyes).   vitamin A 10000 UNIT capsule Take 10,000 Units by mouth daily.   vitamin B-12 1000 MCG tablet Commonly known as:  CYANOCOBALAMIN Take 1,000 mcg by mouth daily.   Vitamin D (Ergocalciferol) 50000 units Caps capsule Commonly known as:  DRISDOL 50,000 Units every 7 (seven) days. Take on Saturday evenings   XTAMPZA ER 18 MG C12a Generic drug:  oxyCODONE ER Take 18 mg by mouth 2 (two) times daily.      Follow-up Information    Shelda Pal, DO. Call.   Specialty:  Family Medicine Why:  Follow up within 1 week Contact information: [place] [address] [phone]          Allergies  Allergen Reactions  . Fish Allergy Anaphylaxis    INCLUDES OMEGA 3 OILS  . Fish Oil Anaphylaxis and Swelling    THROAT SWELLS INCLUDES FISH AS A CLASS  . Fish Oil [Omega-3 Fatty Acids] Swelling    Throat swelling   . Other Anaphylaxis and Other (See Comments)    UNSPECIFIED REACTION  Perch  . Crestor [Rosuvastatin] Other (See Comments)    Extreme joint pain, to this & other statins  . Gabapentin Anxiety    Anxiety on high doses  . Lovastatin Other (See Comments)    EXTREME JOINT PAIN  . Monascus Purpureus Went Yeast Other (See Comments)    Muscle pain and severe joint pain.   . Red Yeast Rice [Cholestin] Other (See Comments)    Muscle pain and severe joint pain.   . Metronidazole Nausea Only and Swelling    Nausea and vomiting   . Neupro [Rotigotine]     Extreme edema   . Shellfish Allergy Other (See Comments)    UNSPECIFIED REACTION   . Suprep [Na Sulfate-K Sulfate-Mg Sulf] Nausea And Vomiting  . Tape Other (See Comments)    Electrodes causes skin breakdown   Consultations:  Discussed Case briefly with  ID Dr. Linus Salmons  Procedures/Studies: Dg Chest 2 View  Result Date: 01/02/2018 CLINICAL DATA:  LEFT leg cellulitis for 3 days.  Pain and swelling. EXAM: CHEST - 2 VIEW COMPARISON:  Chest radiograph October 05, 2017 FINDINGS: Cardiac silhouette is upper limits of normal in size, mediastinal silhouette is nonsuspicious. Pulmonary vascular congestion. Strandy densities LEFT lung base without pleural effusion or focal consolidation. Azygos fissure. Calcification RIGHT humeral head seen with calcific tendinopathy. Surgical clips in the included right abdomen compatible with cholecystectomy. Degenerative change of the thoracic spine and included cervical spine. Large body habitus. IMPRESSION: Borderline cardiomegaly. Pulmonary vascular congestion. LEFT lung base atelectasis. Electronically Signed   By: Elon Alas M.D.   On: 01/02/2018 03:34   Dg Chest Port 1 View  Result Date: 01/03/2018 CLINICAL DATA:  New onset of shortness of breath today EXAM: PORTABLE CHEST 1 VIEW COMPARISON:  Portable chest x-ray of 01/02/2018 FINDINGS: On this portable semi-erect film, no definite pneumonia or effusion is seen. Mild volume loss at the lung bases cannot be excluded. No definite pleural effusion is seen. Mediastinal and hilar contours are unchanged and the heart is within normal limits in size. IMPRESSION: No definite active process. Cannot exclude mild volume loss at the lung bases. Electronically Signed   By: Ivar Drape M.D.   On: 01/03/2018 11:02   Ct Extremity Lower Left W  Contrast  Result Date: 01/04/2018 CLINICAL DATA:  Pain, erythema and swelling with cellulitis of the left lower extremity. EXAM: CT OF THE LOWER LEFT EXTREMITY WITH CONTRAST TECHNIQUE: Multidetector CT imaging of the lower left extremity was performed according to the standard protocol following intravenous contrast administration. COMPARISON:  None. CONTRAST:  179m OMNIPAQUE IOHEXOL 300 MG/ML  SOLN FINDINGS: Bones/Joint/Cartilage No acute  abnormalities of the bones of the left lower extremity. Slight arthritic changes between the bases of the second and third metacarpals. No visible osteomyelitis. No knee or ankle effusions. Slight medial joint space narrowing at the left knee. Ligaments Suboptimally assessed by CT. The cruciate and collateral ligaments at the left knee appear normal. Ligaments at the left ankle are not well enough seen for evaluation. Muscles and Tendons Normal. Soft tissues Nonspecific subcutaneous edema in the left lower leg and on the dorsum of the foot and to a lesser degree in the distal left calf. No visible venous thrombosis. Arterial calcifications in the left leg. Focal area of probable scarring and skin retraction in the lateral aspect of the left thigh, best seen on image 62 of series 9 and image 202 of series 6. IMPRESSION: 1. Nonspecific subcutaneous edema in the left lower extremity. This could represent cellulitis. However, there is no abscess, osteomyelitis, or joint effusion. 2. Slight arthritic changes of the medial aspect of the right knee. Electronically Signed   By: JLorriane ShireM.D.   On: 01/04/2018 16:52    Subjective: Seen and examined at bedside and and improved.  Felt her leg swelling was reduced and the erythema has much improved.  Exercise medicine gotten better and is not as warm or erythematous to touch.  States pain in the leg is improved and she has more motion.  Denies any other complaints and is ready to go home  Discharge Exam: Vitals:   01/06/18 0842 01/06/18 1409  BP:  (!) 145/77  Pulse:  88  Resp:  18  Temp:  98.2 F (36.8 C)  SpO2: 97% 95%   Vitals:   01/06/18 0544 01/06/18 0839 01/06/18 0842 01/06/18 1409  BP: (!) 131/97   (!) 145/77  Pulse: 73   88  Resp: 16   18  Temp: (!) 97.5 F (36.4 C)   98.2 F (36.8 C)  TempSrc: Oral   Oral  SpO2: 98% 97% 97% 95%  Weight:      Height:       General: Pt is alert, awake, not in acute distress Cardiovascular: RRR, S1/S2 +,  no rubs, no gallops Respiratory: Diminished bilaterally, no wheezing, no rhonchi Abdominal: Soft, NT, Distended due to body habitus, bowel sounds + Extremities: Left Lower Extremity swollen with erythema but no warmth and cellulitis is improving, no cyanosis  The results of significant diagnostics from this hospitalization (including imaging, microbiology, ancillary and laboratory) are listed below for reference.    Microbiology: Recent Results (from the past 240 hour(s))  Culture, blood (routine x 2)     Status: None (Preliminary result)   Collection Time: 01/02/18  5:51 PM  Result Value Ref Range Status   Specimen Description   Final    BLOOD RIGHT FOREARM [facility] [address], [place] [address]   Special Requests   Final    BOTTLES DRAWN AEROBIC ONLY Blood Culture adequate volume Performed at [place] [place] [address]   Culture   Final    NO  GROWTH 4 DAYS Performed at [facility], [place] [address], [place], [place] [address]    Report Status PENDING  Incomplete  Culture, blood (routine x 2)     Status: None (Preliminary result)   Collection Time: 01/02/18  5:51 PM  Result Value Ref Range Status   Specimen Description   Final    BLOOD RIGHT ARM Performed at [place] [address], [place], [place] [address]    Special Requests   Final    BOTTLES DRAWN AEROBIC AND ANAEROBIC Blood Culture adequate volume Performed at [place] [address], [place], [place] [address]    Culture   Final    NO GROWTH 4 DAYS Performed at [facility], [place] [address], [place], [place] [address]    Report Status PENDING  Incomplete  MRSA PCR Screening     Status: None   Collection Time: 01/02/18  6:12 PM  Result Value Ref Range Status   MRSA by PCR NEGATIVE NEGATIVE Final    Comment:        The GeneXpert MRSA Assay (FDA approved for  NASAL specimens only), is one component of a comprehensive MRSA colonization surveillance program. It is not intended to diagnose MRSA infection nor to guide or monitor treatment for MRSA infections. Performed at [facility], [place] [address], [place], [place] [address]     Labs: BNP (last 3 results) Recent Labs    01/02/18 1809  BNP 36.6   Basic Metabolic Panel: Recent Labs  Lab 01/02/18 0243 01/03/18 0552 01/04/18 0549 01/05/18 1016 01/06/18 0605  NA 133* 138 139 135 140  K 3.9 4.0 4.1 4.0 4.2  CL 98* 103 102 96* 99*  CO2 '25 23 25 24 29  '$ GLUCOSE 257* 194* 217* 275* 148*  BUN '17 9 8 10 12  '$ CREATININE 1.01* 0.87 0.77 0.89 0.99  CALCIUM 8.8* 8.4* 9.2 9.0 9.6  MG  --   --  1.8 1.7 2.0  PHOS  --   --  3.1 3.3 4.3   Liver Function Tests: Recent Labs  Lab 01/02/18 0243 01/03/18 0552 01/04/18 0549 01/05/18 1016 01/06/18 0605  AST 41 30 37 50* 67*  ALT 45 34 39 47 61*  ALKPHOS 62 52 58 62 64  BILITOT 0.5 0.3 0.7 0.5 0.7  PROT 6.9 5.6* 7.0 7.1 7.4  ALBUMIN 3.6 2.8* 3.2* 3.4* 3.5   No results for input(s): LIPASE, AMYLASE in the last 168 hours. No results for input(s): AMMONIA in the last 168 hours. CBC: Recent Labs  Lab 01/02/18 0243 01/03/18 0552 01/04/18 0549 01/05/18 1016 01/06/18 0605  WBC 10.2 7.4 6.5 7.4 7.9  NEUTROABS 7.2  --  2.8 4.1 4.2  HGB 13.5 11.4* 12.5 13.0 13.0  HCT 41.4 36.0 39.6 40.6 41.4  MCV 90.8 91.8 91.0 90.8 91.2  PLT 157 161 198 241 250   Cardiac Enzymes: No results for input(s): CKTOTAL, CKMB, CKMBINDEX, TROPONINI in the last 168 hours. BNP: Invalid input(s): POCBNP CBG: Recent Labs  Lab 01/05/18 0739 01/05/18 1230 01/05/18 1703 01/06/18 0743 01/06/18 1224  GLUCAP 241* 188* 130* 147* 262*   D-Dimer No results for input(s): DDIMER in the last 72 hours. Hgb A1c No results for input(s): HGBA1C in the last 72 hours. Lipid Profile No results for input(s): CHOL, HDL, LDLCALC, TRIG, CHOLHDL, LDLDIRECT  in the last 72 hours. Thyroid function studies No results for input(s): TSH, T4TOTAL, T3FREE, THYROIDAB in the last 72  hours.  Invalid input(s): FREET3 Anemia work up No results for input(s): VITAMINB12, FOLATE, FERRITIN, TIBC, IRON, RETICCTPCT in the last 72 hours. Urinalysis    Component Value Date/Time   COLORURINE AMBER (A) 01/02/2018 2011   APPEARANCEUR HAZY (A) 01/02/2018 2011   LABSPEC 1.018 01/02/2018 2011   PHURINE 5.0 01/02/2018 2011   GLUCOSEU 50 (A) 01/02/2018 2011   HGBUR NEGATIVE 01/02/2018 2011   [place] NEGATIVE 01/02/2018 2011   [place] 01/02/2018 2011   PROTEINUR NEGATIVE 01/02/2018 2011   UROBILINOGEN 0.2 02/10/2015 1835   NITRITE NEGATIVE 01/02/2018 2011   LEUKOCYTESUR NEGATIVE 01/02/2018 2011   Sepsis Labs Invalid input(s): PROCALCITONIN,  WBC,  LACTICIDVEN Microbiology Recent Results (from the past 240 hour(s))  Culture, blood (routine x 2)     Status: None (Preliminary result)   Collection Time: 01/02/18  5:51 PM  Result Value Ref Range Status   Specimen Description   Final    BLOOD RIGHT FOREARM Performed at [facility], [place] [address], [place], [place] [address]    Special Requests   Final    BOTTLES DRAWN AEROBIC ONLY Blood Culture adequate volume Performed at [place] [address], [place], [place] [id number]    Culture   Final    NO GROWTH 4 DAYS Performed at [facility], [place] [address], [place], [place] [address]    Report Status PENDING  Incomplete  Culture, blood (routine x 2)     Status: None (Preliminary result)   Collection Time: 01/02/18  5:51 PM  Result Value Ref Range Status   Specimen Description   Final    BLOOD RIGHT ARM Performed at [facility] [address], [place], [place] [address]    Special Requests   Final    BOTTLES DRAWN AEROBIC AND ANAEROBIC Blood Culture adequate volume Performed at [facility]  [address], [place], [place] [address]    Culture   Final    NO GROWTH 4 DAYS Performed at [facility], [place] [address], [place], [place] [address]    Report Status PENDING  Incomplete  MRSA PCR Screening     Status: None   Collection Time: 01/02/18  6:12 PM  Result Value Ref Range Status   MRSA by PCR NEGATIVE NEGATIVE Final    Comment:        The GeneXpert MRSA Assay (FDA approved for NASAL specimens only), is one component of a comprehensive MRSA colonization surveillance program. It is not intended to diagnose MRSA infection nor to guide or monitor treatment for MRSA infections. Performed at [facility], [place] [address] [place], [place] [address]    Time coordinating discharge: 35 minutes  SIGNED:  Kerney Elbe, DO [facility] 01/06/2018, 7:48 PM Pager [phone]  If 7PM-7AM, please contact night-coverage [email or web address] Password [email or web address]

## 2018-01-07 LAB — CULTURE, BLOOD (ROUTINE X 2)
Culture: NO GROWTH
Culture: NO GROWTH
Special Requests: ADEQUATE
Special Requests: ADEQUATE

## 2018-01-11 ENCOUNTER — Inpatient Hospital Stay: Payer: BLUE CROSS/BLUE SHIELD | Admitting: Family Medicine

## 2018-01-18 ENCOUNTER — Encounter: Payer: Self-pay | Admitting: Family Medicine

## 2018-01-18 ENCOUNTER — Ambulatory Visit (INDEPENDENT_AMBULATORY_CARE_PROVIDER_SITE_OTHER): Payer: BLUE CROSS/BLUE SHIELD | Admitting: Family Medicine

## 2018-01-18 VITALS — BP 118/68 | HR 95 | Temp 98.0°F | Ht 61.0 in | Wt 299.1 lb

## 2018-01-18 DIAGNOSIS — L03116 Cellulitis of left lower limb: Secondary | ICD-10-CM

## 2018-01-18 LAB — COMPREHENSIVE METABOLIC PANEL
ALT: 38 U/L — ABNORMAL HIGH (ref 0–35)
AST: 30 U/L (ref 0–37)
Albumin: 3.8 g/dL (ref 3.5–5.2)
Alkaline Phosphatase: 61 U/L (ref 39–117)
BUN: 12 mg/dL (ref 6–23)
CO2: 28 mEq/L (ref 19–32)
Calcium: 9.4 mg/dL (ref 8.4–10.5)
Chloride: 102 mEq/L (ref 96–112)
Creatinine, Ser: 0.81 mg/dL (ref 0.40–1.20)
GFR: 77.59 mL/min (ref >60.00)
Glucose, Bld: 189 mg/dL — ABNORMAL HIGH (ref 70–99)
Potassium: 4 mEq/L (ref 3.5–5.1)
Sodium: 139 mEq/L (ref 135–145)
Total Bilirubin: 0.5 mg/dL (ref 0.2–1.2)
Total Protein: 6.9 g/dL (ref 6.0–8.3)

## 2018-01-18 NOTE — Progress Notes (Signed)
Pre visit review using our clinic review tool, if applicable. No additional management support is needed unless otherwise documented below in the visit note. 

## 2018-01-18 NOTE — Patient Instructions (Signed)
Take the [facility] daily.  Continue Xyzal and Flonase in meantime.  Stay as active as possible.  Let us know if you need anything.

## 2018-01-18 NOTE — Progress Notes (Signed)
Chief Complaint  Patient presents with  . Hospitalization Follow-up    cellulitis    HPI [name] is a 57 y.o. y.o. female who presents for a transition of care visit.  Pt was discharged from [facility] on 01/06/18.  Within 48 business hours of discharge our office contacted him via telephone to coordinate his care and needs.  She is here with her husband.  The patient was admitted on 01/02/2018 and discharged on 01/06/2018 for left lower extremity cellulitis.  She received IV antibiotics and started to improve.  She was transitioned to oral antibiotics and has since finished the course.  She feels much better since being admitted.  She has been using a Cetaphil topical to help with scaling skin over the area that was infected.  She is not having any fevers or shortness of breath.  Pollen appears to be a trigger for her asthma.  She is using Flonase and Xyzal with some relief.  She is also starting to use Breo again which is helpful for her asthma.  Social History   Socioeconomic History  . Marital status: Married   Past Medical History:  Diagnosis Date  . Asthma    QVAR daily and Albuterol as needed  . Chronic diastolic CHF (congestive heart failure) (HCC) 09/2015  . Chronic pain syndrome    Fentanyl Patch  . Colon polyps   . Coronary artery disease    cath with normal LM, 30% LAD, 85% mid RCA and 95% distal RCA s/p PCI of the mid to distal RCA and now on DAPT with ASA and Ticagrelor.    . Diabetes mellitus    takes Metformin and Hum R daily  . Eczema   . Excessive daytime sleepiness 11/26/2015  . Fibromyalgia   . Gallstones   . GERD (gastroesophageal reflux disease)   . Hyperlipidemia   . Hypertension    takes Metoprolol and Enalapril daily  . Hypothyroidism    takes Synthroid daily  . IBS (irritable bowel syndrome)   . Mixed connective tissue disease (HCC)    Family History  Problem Relation Age of Onset  . CAD Mother   . Hypertension Mother   . Heart attack Mother   . CAD  Father   . Heart attack Father   . Allergic rhinitis Father   . Asthma Father   . Hypertension Brother   . Hypertension Brother   . Pancreatic cancer Paternal Aunt   . Breast cancer Paternal Aunt   . Lung cancer Paternal Aunt    Allergies as of 01/18/2018      Reactions   Fish Allergy Anaphylaxis   INCLUDES OMEGA 3 OILS   Fish Oil Anaphylaxis, Swelling   THROAT SWELLS INCLUDES FISH AS A CLASS   Fish Oil [omega-3 Fatty Acids] Swelling   Throat swelling    Other Anaphylaxis, Other (See Comments)   UNSPECIFIED REACTION  Perch   Crestor [rosuvastatin] Other (See Comments)   Extreme joint pain, to this & other statins   Gabapentin Anxiety   Anxiety on high doses   Lovastatin Other (See Comments)   EXTREME JOINT PAIN   Monascus Purpureus Went Yeast Other (See Comments)   Muscle pain and severe joint pain.    Red Yeast Rice [cholestin] Other (See Comments)   Muscle pain and severe joint pain.    Metronidazole Nausea Only, Swelling   Nausea and vomiting   Neupro [rotigotine]    Extreme edema   Shellfish Allergy Other (See Comments)   UNSPECIFIED  REACTION    Suprep [na Sulfate-k Sulfate-mg Sulf] Nausea And Vomiting   Tape Other (See Comments)   Electrodes causes skin breakdown      Medication List        Accurate as of 01/18/18 11:58 AM. Always use your most recent med list.          albuterol 108 (90 Base) MCG/ACT inhaler Commonly known as:  PROAIR HFA Inhale 2 puffs into the lungs every 4 (four) hours as needed for wheezing or shortness of breath.   aspirin EC 81 MG tablet Take 81 mg by mouth daily.   BAYER CONTOUR TEST test strip Generic drug:  glucose blood CHECK BLOOD SUGAR 4 TIMES DAILY   betamethasone dipropionate 0.05 % cream Commonly known as:  DIPROLENE Apply 1 application topically daily as needed (eczema).   BRILINTA 90 MG Tabs tablet Generic drug:  ticagrelor TAKE ONE TABLET BY MOUTH TWICE DAILY   diphenhydrAMINE 25 MG tablet Commonly known as:   BENADRYL Take 1 tablet (25 mg total) by mouth every 6 (six) hours as needed for itching or allergies.   enalapril 10 MG tablet Commonly known as:  VASOTEC Take 1 tablet (10 mg total) 2 (two) times daily by mouth.   EPINEPHrine 0.3 mg/0.3 mL Soaj injection Commonly known as:  EPIPEN 2-PAK USE AS DIRECTED FOR SEVERE ALLERGIC REACTION.   fluticasone 50 MCG/ACT nasal spray Commonly known as:  FLONASE USE 2 SPRAY(S) IN EACH NOSTRIL ONCE DAILY AS NEEDED FOR ALLERGIES OR  RHINITIS   fluticasone furoate-vilanterol 200-25 MCG/INH Aepb Commonly known as:  BREO ELLIPTA Inhale 1 puff into the lungs daily.   furosemide 40 MG tablet Commonly known as:  LASIX Take 1 tablet (40 mg total) by mouth daily as needed for fluid.   gabapentin 100 MG capsule Commonly known as:  NEURONTIN Take 400-700 mg by mouth See admin instructions. Pt takes 400mg  in the morning, 400mg  at lunchtime and 700mg  in the evening   GLUCOMANNAN PO Take 700 mg by mouth 2 (two) times daily.   HUMULIN R 500 UNIT/ML injection Generic drug:  insulin regular human CONCENTRATED Inject 0-100 Units into the skin 4 (four) times daily -  before meals and at bedtime. Breakfast and lunch <100-125= 5 units 125-150= 35 u 151-200 = 45 u 201-249 = 55 u 250 - 300 = 65 u 301 - 349 = 75 u 350 - 400 = 85 u 401 - 449 = 95 u 450 - 500 = 105 u  bedtime doses <M100 - 150 = 10 u 151-200= 35 u 201 - 300 = 45u 301- 400 = 55 u 401 - 500 = 65 u   hydroxychloroquine 200 MG tablet Commonly known as:  PLAQUENIL Take 200 mg by mouth 2 (two) times daily.   ipratropium-albuterol 0.5-2.5 (3) MG/3ML Soln Commonly known as:  DUONEB Take 3 mLs by nebulization every 4 (four) hours as needed (SOB, wheezing).   levocetirizine 5 MG tablet Commonly known as:  XYZAL Take 1 tablet (5 mg total) by mouth every evening.   levothyroxine 200 MCG tablet Commonly known as:  SYNTHROID, LEVOTHROID Take 200 mcg by mouth daily before breakfast. Except 100  mcg on Monday   LIPO-FLAVONOID PLUS PO Take 1 tablet by mouth 3 (three) times daily.   metFORMIN 500 MG 24 hr tablet Commonly known as:  GLUCOPHAGE-XR Take 1,000 mg by mouth 2 (two) times daily.   metoprolol tartrate 25 MG tablet Commonly known as:  LOPRESSOR Take 1 tablet (25  mg total) by mouth 2 (two) times daily. Please make overdue yearly appt with Dr. Mayford Knife before anymore refills 2nd attempt   milk thistle 175 MG tablet Take 175 mg by mouth 2 (two) times daily.   MUCINEX MAXIMUM STRENGTH 1200 MG Tb12 Generic drug:  Guaifenesin Take 1,200 mg by mouth 2 (two) times daily.   ondansetron 4 MG tablet Commonly known as:  ZOFRAN Take 1 tablet (4 mg total) by mouth every 8 (eight) hours as needed for nausea or vomiting.   oxymetazoline 0.05 % nasal spray Commonly known as:  AFRIN Place 1 spray into both nostrils 2 (two) times daily as needed for congestion.   PREMARIN VA Place 1 application vaginally 3 (three) times a week. On Saturdays, Mondays, and Wednesday (cream)   promethazine 25 MG tablet Commonly known as:  PHENERGAN Take 1 tablet (25 mg total) by mouth every 8 (eight) hours as needed for nausea or vomiting.   pyridoxine 100 MG tablet Commonly known as:  B-6 Take 100 mg by mouth 2 (two) times daily.   rotigotine 2 MG/24HR Commonly known as:  NEUPRO Place 1 patch onto the skin daily.   saccharomyces boulardii 250 MG capsule Commonly known as:  FLORASTOR Take 1 capsule (250 mg total) by mouth 2 (two) times daily.   silver sulfADIAZINE 1 % cream Commonly known as:  SILVADENE Apply 1 application topically daily.   SYSTANE ULTRA OP Apply 1 drop to eye daily as needed (dry eyes).   vitamin A 32122 UNIT capsule Take 10,000 Units by mouth daily.   vitamin B-12 1000 MCG tablet Commonly known as:  CYANOCOBALAMIN Take 1,000 mcg by mouth daily.   Vitamin D (Ergocalciferol) 50000 units Caps capsule Commonly known as:  DRISDOL 50,000 Units every 7 (seven) days.  Take on Saturday evenings   XTAMPZA ER 18 MG C12a Generic drug:  oxyCODONE ER Take 18 mg by mouth 2 (two) times daily.       ROS:  Constitutional: No fevers or chills, no weight loss HEENT: No headaches, hearing loss, or runny nose, no sore throat Heart: No chest pain Lungs: No current SOB, no cough Abd: No bowel changes, no pain, no N/V GU: No urinary complaints Neuro: No numbness, tingling or weakness Msk: No joint or muscle pain Skin: Improved cellulitis of left lower extremity  Objective BP 118/68 (BP Location: Left Arm, Patient Position: Sitting, Cuff Size: Large)   Pulse 95   Temp 98 F (36.7 C) (Oral)   Ht 5\' 1"  (1.549 m)   Wt 299 lb 2 oz (135.7 kg)   SpO2 95%   BMI 56.52 kg/m  General Appearance:  awake, alert, oriented, in no acute distress and well developed, well nourished Skin:  See below; no warmth, ttp, fluctuance Head/face:  NCAT Eyes:  EOMI, PERRLA Ears:  canals and TMs NI Nose/Sinuses:  negative Mouth/Throat:  Mucosa moist, no lesions; pharynx without erythema, edema or exudate. Neck:  neck- supple, no mass, non-tender and no jvd Lungs: Clear to auscultation.  No rales, rhonchi, or wheezing. Normal effort, no accessory muscle use. Heart:  Heart sounds are normal.  Regular rate and rhythm, no gallop or rub. No bruits. Abdomen:  BS+, soft, NT, ND, no masses or organomegaly Musculoskeletal:  No muscle group atrophy or asymmetry Neurologic:  Alert and oriented x 3 Psych exam: Nml mood and affect, age appropriate judgment and insight  Cellulitis of left lower extremity - Plan: Comprehensive metabolic panel  Discharge summary and medication list have been  reviewed/reconciled.  Labs pending at the time of discharge have been reviewed or are still pending at the time of this visit.  Follow-up labs and appointments have been ordered and/or coordinated appropriately. Educational materials regarding the patient's admitting diagnosis provided.  TRANSITIONAL CARE  MANAGEMENT CERTIFICATION:  I certify the following are true:   1. Communication with the patient/care giver was made within 2 business days of discharge.  2. Complexity of Medical decision making is moderate.  3. Face to face visit occurred within 14 days of discharge.   F/u in 3 mo for med check.. The patient voiced understanding and agreement to the plan.  Jilda Roche [place], DO 01/18/18 11:58 AM

## 2018-01-19 ENCOUNTER — Encounter: Payer: Self-pay | Admitting: Family Medicine

## 2018-01-19 DIAGNOSIS — K760 Fatty (change of) liver, not elsewhere classified: Secondary | ICD-10-CM | POA: Insufficient documentation

## 2018-02-11 ENCOUNTER — Other Ambulatory Visit: Payer: Self-pay | Admitting: Family Medicine

## 2018-02-11 DIAGNOSIS — J45909 Unspecified asthma, uncomplicated: Secondary | ICD-10-CM

## 2018-03-16 ENCOUNTER — Other Ambulatory Visit: Payer: Self-pay | Admitting: Family Medicine

## 2018-03-18 NOTE — Progress Notes (Signed)
Cardiology Office Note:    Date:  03/19/2018   ID:  [name], DOB 1961/07/27, MRN [id number]  PCP:  Sharlene Dory, DO  Cardiologist:  No primary care provider on file.    Referring MD: Sharlene Dory*   Chief Complaint  Patient presents with  . Coronary Artery Disease  . Hypertension  . Hyperlipidemia    History of Present Illness:    [name] is a 57 y.o. female with a hx of ASCAD with normal LM, 30% LAD, 85% mid RCA and 95% distal RCA s/p PCI of the mid to distal RCA 09/2015 and now on DAPT with ASA and Ticagrelor. She also has HTN, hyperlipidemia and DM.  When I saw her a year ago she was complaining of SOB similar to prior angina and nuclear stress test showed no ischemia.  She is here today for followup and has multiple complaints.  She has been trying to get off more potent pain meds and has been using ibuprofen and now has had some problems with burning in her esophagus.  She also has been having DOE with any type of exertion including walking.  She is concerned that it may be the Brilinta.  She has also started having CP but is not sure if it is what she had with her heart in the past.  She has a lot of chronic pain and wants to be able to use NSAIDS.  She would like to get off of Brilinta.  Her mobility is very restricted due to her chronic pain.  She denies any PND, orthopnea, dizziness, palpitations or syncope.  She has chronic LE edema controlled on PRN diuretics. She is compliant with her meds and is tolerating meds with no SE.    Past Medical History:  Diagnosis Date  . Asthma    QVAR daily and Albuterol as needed  . Chronic diastolic CHF (congestive heart failure) (HCC) 09/2015  . Chronic pain syndrome    Fentanyl Patch  . Colon polyps   . Coronary artery disease    cath with normal LM, 30% LAD, 85% mid RCA and 95% distal RCA s/p PCI of the mid to distal RCA and now on DAPT with ASA and Ticagrelor.    . Diabetes mellitus    takes Metformin and  Hum R daily  . Eczema   . Excessive daytime sleepiness 11/26/2015  . Fibromyalgia   . Gallstones   . GERD (gastroesophageal reflux disease)   . Hyperlipidemia   . Hypertension    takes Metoprolol and Enalapril daily  . Hypothyroidism    takes Synthroid daily  . IBS (irritable bowel syndrome)   . Mixed connective tissue disease (HCC)   . NAFLD (nonalcoholic fatty liver disease)     Past Surgical History:  Procedure Laterality Date  . ABDOMINAL HYSTERECTOMY    . APPENDECTOMY    . BREAST BIOPSY Bilateral    9 total  . CARDIAC CATHETERIZATION    . cardiac stents N/A 05/23/2016   pt notified department upon arrival for exam .....Marland Kitchen2 stents  . CHOLECYSTECTOMY    . CORONARY ANGIOPLASTY     normal LM, 30% LAD, 85% mid RCA and 95% distal RCA s/p PCI of the mid to distal RCA and now on DAPT with ASA and Ticagrelor.    . LEG SURGERY Left    muscle biospy  . RADIOLOGY WITH ANESTHESIA N/A 05/25/2016   Procedure: RADIOLOGY WITH ANESTHESIA;  Surgeon: Medication Radiologist, MD;  Location: [facility] OR;  Service:  Radiology;  Laterality: N/A;  . TONSILLECTOMY AND ADENOIDECTOMY    . WRIST SURGERY Left     Current Medications: Current Meds  Medication Sig  . albuterol (PROAIR HFA) 108 (90 Base) MCG/ACT inhaler Inhale 2 puffs into the lungs every 4 (four) hours as needed for wheezing or shortness of breath.  Marland Kitchen aspirin EC 81 MG tablet Take 81 mg by mouth daily.  . betamethasone dipropionate (DIPROLENE) 0.05 % cream Apply 1 application topically daily as needed (eczema).  . diphenhydrAMINE (BENADRYL) 25 MG tablet Take 1 tablet (25 mg total) by mouth every 6 (six) hours as needed for itching or allergies.  Marland Kitchen enalapril (VASOTEC) 10 MG tablet Take 1 tablet (10 mg total) 2 (two) times daily by mouth.  . EPINEPHrine (EPIPEN 2-PAK) 0.3 mg/0.3 mL IJ SOAJ injection USE AS DIRECTED FOR SEVERE ALLERGIC REACTION.  . Estrogens, Conjugated (PREMARIN VA) Place 1 application vaginally 3 (three) times a week. On  Saturdays, Mondays, and Wednesday (cream)  . fluticasone (FLONASE) 50 MCG/ACT nasal spray USE 2 SPRAY(S) IN EACH NOSTRIL ONCE DAILY AS NEEDED FOR ALLERGIES OR  RHINITIS  . fluticasone furoate-vilanterol (BREO ELLIPTA) 200-25 MCG/INH AEPB Inhale 1 puff into the lungs daily.  . furosemide (LASIX) 40 MG tablet Take 1 tablet (40 mg total) by mouth daily as needed for fluid.  Marland Kitchen gabapentin (NEURONTIN) 100 MG capsule Take 400-700 mg by mouth See admin instructions. Pt takes 400mg  in the morning, 400mg  at lunchtime and 700mg  in the evening  . GLUCOMANNAN PO Take 700 mg by mouth 2 (two) times daily.  Marland Kitchen glucose blood (BAYER CONTOUR TEST) test strip CHECK BLOOD SUGAR 4 TIMES DAILY  . Guaifenesin (MUCINEX MAXIMUM STRENGTH) 1200 MG TB12 Take 1,200 mg by mouth 2 (two) times daily.  . hydroxychloroquine (PLAQUENIL) 200 MG tablet Take 200 mg by mouth 2 (two) times daily.  . insulin regular human CONCENTRATED (HUMULIN R) 500 UNIT/ML SOLN injection Inject 0-100 Units into the skin 4 (four) times daily -  before meals and at bedtime. Breakfast and lunch <100-125= 5 units 125-150= 35 u 151-200 = 45 u 201-249 = 55 u 250 - 300 = 65 u 301 - 349 = 75 u 350 - 400 = 85 u 401 - 449 = 95 u 450 - 500 = 105 u  bedtime doses <M100 - 150 = 10 u 151-200= 35 u 201 - 300 = 45u 301- 400 = 55 u 401 - 500 = 65 u  . ipratropium-albuterol (DUONEB) 0.5-2.5 (3) MG/3ML SOLN Take 3 mLs by nebulization every 4 (four) hours as needed (SOB, wheezing).  Marland Kitchen levocetirizine (XYZAL) 5 MG tablet TAKE 1 TABLET BY MOUTH ONCE DAILY IN THE EVENING  . levothyroxine (SYNTHROID, LEVOTHROID) 200 MCG tablet Take 200 mcg by mouth daily before breakfast. Except 100 mcg on Monday  . metFORMIN (GLUCOPHAGE-XR) 500 MG 24 hr tablet Take 1,000 mg by mouth 2 (two) times daily.  . metoprolol tartrate (LOPRESSOR) 25 MG tablet Take 1 tablet (25 mg total) by mouth 2 (two) times daily. Please make overdue yearly appt with Dr. Mayford Knife before anymore refills 2nd  attempt  . milk thistle 175 MG tablet Take 175 mg by mouth 2 (two) times daily.   . ondansetron (ZOFRAN) 4 MG tablet Take 1 tablet (4 mg total) by mouth every 8 (eight) hours as needed for nausea or vomiting.  . Oxycodone HCl 10 MG TABS Take 1 tablet by mouth 4 (four) times daily as needed.  Marland Kitchen oxymetazoline (AFRIN) 0.05 % nasal  spray Place 1 spray into both nostrils 2 (two) times daily as needed for congestion.  Bertram Gala Glycol-Propyl Glycol (SYSTANE ULTRA OP) Apply 1 drop to eye daily as needed (dry eyes).  . promethazine (PHENERGAN) 25 MG tablet Take 1 tablet (25 mg total) by mouth every 8 (eight) hours as needed for nausea or vomiting.  . pyridoxine (B-6) 100 MG tablet Take 100 mg by mouth 2 (two) times daily.  . rotigotine (NEUPRO) 2 MG/24HR Place 1 patch onto the skin daily.  Marland Kitchen saccharomyces boulardii (FLORASTOR) 250 MG capsule Take 1 capsule (250 mg total) by mouth 2 (two) times daily. (Patient taking differently: Take 250 mg by mouth 2 (two) times daily. Take 1 capsule with breakfast, 1 with lunch, and 2 at bedtime)  . silver sulfADIAZINE (SILVADENE) 1 % cream Apply 1 application topically daily.  . vitamin A 91638 UNIT capsule Take 10,000 Units by mouth daily.  . vitamin B-12 (CYANOCOBALAMIN) 1000 MCG tablet Take 1,000 mcg by mouth daily.    . Vitamin D, Ergocalciferol, (DRISDOL) 50000 units CAPS capsule 50,000 Units every 7 (seven) days. Take on Saturday evenings  . Vitamins-Lipotropics (LIPO-FLAVONOID PLUS PO) Take 1 tablet by mouth 3 (three) times daily.  . [DISCONTINUED] BRILINTA 90 MG TABS tablet TAKE ONE TABLET BY MOUTH TWICE DAILY  . [DISCONTINUED] metoprolol tartrate (LOPRESSOR) 25 MG tablet Take 1 tablet (25 mg total) by mouth 2 (two) times daily. Please make overdue yearly appt with Dr. Mayford Knife before anymore refills 2nd attempt   Current Facility-Administered Medications for the 03/19/18 encounter (Office Visit) with Quintella Reichert, MD  Medication  . ipratropium-albuterol  (DUONEB) 0.5-2.5 (3) MG/3ML nebulizer solution 3 mL     Allergies:   Fish allergy; Fish oil; Fish oil [omega-3 fatty acids]; Other; Crestor [rosuvastatin]; Gabapentin; Lovastatin; Monascus purpureus went yeast; Red yeast rice [cholestin]; Metronidazole; Neupro [rotigotine]; Shellfish allergy; Suprep [na sulfate-k sulfate-mg sulf]; and Tape   Social History   Socioeconomic History  . Marital status: Married    Spouse name: Not on file  . Number of children: 1  . Years of education: Not on file  . Highest education level: Not on file  Occupational History  . Not on file  Social Needs  . Financial resource strain: Not on file  . Food insecurity:    Worry: Not on file    Inability: Not on file  . Transportation needs:    Medical: Not on file    Non-medical: Not on file  Tobacco Use  . Smoking status: Former Smoker    Packs/day: 1.00    Years: 20.00    Pack years: 20.00    Types: Cigarettes    Last attempt to quit: 02/11/2004    Years since quitting: 14.1  . Smokeless tobacco: Never Used  Substance and Sexual Activity  . Alcohol use: Yes    Comment: once a month  . Drug use: No  . Sexual activity: Yes    Birth control/protection: Surgical  Lifestyle  . Physical activity:    Days per week: Not on file    Minutes per session: Not on file  . Stress: Not on file  Relationships  . Social connections:    Talks on phone: Not on file    Gets together: Not on file    Attends religious service: Not on file    Active member of club or organization: Not on file    Attends meetings of clubs or organizations: Not on file    Relationship  status: Not on file  Other Topics Concern  . Not on file  Social History Narrative  . Not on file     Family History: The patient's family history includes Allergic rhinitis in her father; Asthma in her father; Breast cancer in her paternal aunt; CAD in her father and mother; Heart attack in her father and mother; Hypertension in her brother,  brother, and mother; Lung cancer in her paternal aunt; Pancreatic cancer in her paternal aunt.  ROS:   Please see the history of present illness.    ROS  All other systems reviewed and negative.   EKGs/Labs/Other Studies Reviewed:    The following studies were reviewed today: none  EKG:  EKG is not  ordered today.    Recent Labs: 01/02/2018: B Natriuretic Peptide 21.3; TSH 1.388 01/06/2018: Hemoglobin 13.0; Magnesium 2.0; Platelets 250 01/18/2018: ALT 38; BUN 12; Creatinine, Ser 0.81; Potassium 4.0; Sodium 139   Recent Lipid Panel    Component Value Date/Time   CHOL 147 12/18/2016 1354   TRIG 154 (H) 12/18/2016 1354   HDL 50 12/18/2016 1354   CHOLHDL 2.9 12/18/2016 1354   CHOLHDL 5.2 02/11/2015 0310   VLDL 28 02/11/2015 0310   LDLCALC 66 12/18/2016 1354    Physical Exam:    VS:  BP 132/80   Pulse (!) 108   Ht 5\' 1"  (1.549 m)   Wt 294 lb (133.4 kg)   SpO2 94%   BMI 55.55 kg/m     Wt Readings from Last 3 Encounters:  03/19/18 294 lb (133.4 kg)  01/18/18 299 lb 2 oz (135.7 kg)  01/05/18 290 lb (131.5 kg)     GEN:  Well nourished, well developed in no acute distress HEENT: Normal NECK: No JVD; No carotid bruits LYMPHATICS: No lymphadenopathy CARDIAC: RRR, no murmurs, rubs, gallops RESPIRATORY:  Clear to auscultation without rales, wheezing or rhonchi  ABDOMEN: Soft, non-tender, non-distended MUSCULOSKELETAL:  No edema; No deformity  SKIN: Warm and dry NEUROLOGIC:  Alert and oriented x 3 PSYCHIATRIC:  Normal affect   ASSESSMENT:    1. Coronary artery disease involving native coronary artery of native heart without angina pectoris   2. Benign essential HTN   3. Hyperlipidemia LDL goal <70   4. SOB (shortness of breath)   5. Gastroesophageal reflux disease without esophagitis    PLAN:    In order of problems listed above:  1.  ASCAD - cath 09/2015 showed normal LM, 30% LAD, 85% mid RCA and 95% distal RCA s/p PCI of the mid to distal RCA 09/2015 and now on  DAPT with ASA and Ticagrelor. N uclear stress test a year ago showed no ischemia. I will check a 2D echo and Lexiscan myoview to rule out ischemia given her worsening SOB and CP.  She will continue on ASA 81mg  daily and BB.  She is statin intolerant. I told her it was ok to stop her Brilinta and see if SOB improves.  She is having a lot of problems with chronic pain and needs to be able to take NSAIDS, therefore I will keep her off antiplatelet therapy except for ASA.    2.  HTN - BP is controlled on exam today.  She will continue on enalapril 10mg  BID and lopressor 25mg  BID.  Creatinine was normal at 0.81 on 01/2018.    3.  Hyperlipidemia - LDL goal is < 70.  She is statin intolerant.  I will refer to lipid clinic for PCSK9i and repeat FLP and  ALT when she is fasting.    4.  GERD - I suspect that the burning in her chest is related to GERD likely exacerbated by her NSAID use.  I have instructed her to start OTC Nexium 20mg  BID and we will get her an appt with her PCP.   Medication Adjustments/Labs and Tests Ordered: Current medicines are reviewed at length with the patient today.  Concerns regarding medicines are outlined above.  Orders Placed This Encounter  Procedures  . Basic metabolic panel  . Lipid panel  . Hepatic function panel  . AMB Referral to [facility]  . MYOCARDIAL PERFUSION IMAGING  . ECHOCARDIOGRAM COMPLETE   Meds ordered this encounter  Medications  . metoprolol tartrate (LOPRESSOR) 25 MG tablet    Sig: Take 1 tablet (25 mg total) by mouth 2 (two) times daily. Please make overdue yearly appt with Dr. before anymore refills 2nd attempt    Dispense:  180 tablet    Refill:  3    Please call our office to schedule an overdue yearly appointment with Dr. Mayford Knife before anymore refills. [phone]. Thank you 2nd attempt    Signed, [phone], MD  03/19/2018 8:45 AM    [facility]

## 2018-03-19 ENCOUNTER — Encounter

## 2018-03-19 ENCOUNTER — Ambulatory Visit: Payer: BLUE CROSS/BLUE SHIELD | Admitting: Cardiology

## 2018-03-19 ENCOUNTER — Encounter: Payer: Self-pay | Admitting: Cardiology

## 2018-03-19 VITALS — BP 132/80 | HR 108 | Ht 61.0 in | Wt 294.0 lb

## 2018-03-19 DIAGNOSIS — I2510 Atherosclerotic heart disease of native coronary artery without angina pectoris: Secondary | ICD-10-CM | POA: Diagnosis not present

## 2018-03-19 DIAGNOSIS — K219 Gastro-esophageal reflux disease without esophagitis: Secondary | ICD-10-CM

## 2018-03-19 DIAGNOSIS — R0602 Shortness of breath: Secondary | ICD-10-CM

## 2018-03-19 DIAGNOSIS — E785 Hyperlipidemia, unspecified: Secondary | ICD-10-CM | POA: Diagnosis not present

## 2018-03-19 DIAGNOSIS — I10 Essential (primary) hypertension: Secondary | ICD-10-CM | POA: Diagnosis not present

## 2018-03-19 MED ORDER — METOPROLOL TARTRATE 25 MG PO TABS: 25.0000 mg | ORAL_TABLET | Freq: Two times a day (BID) | ORAL | 3 refills | Status: DC

## 2018-03-19 NOTE — Patient Instructions (Signed)
Medication Instructions:  STOP: Brilinta  START: Nexium over the counter 20 mg two times a day   If you need a refill on your cardiac medications, please contact your pharmacy first.  Labwork: Your physician recommends that you return for lab work in: 1 week for basic metabolic panel, liver function and fasting cholesterol check   Testing/Procedures: Your physician has requested that you have an echocardiogram. Echocardiography is a painless test that uses sound waves to create images of your heart. It provides your doctor with information about the size and shape of your heart and how well your heart's chambers and valves are working. This procedure takes approximately one hour. There are no restrictions for this procedure.  Your physician has requested that you have a lexiscan myoview. For further information please visit [email or web address] Please follow instruction sheet, as given.  Follow-Up: Your physician would like you to schedule an appointment with your primary MD this week for possible GERD evaluation   You have been referred to lipid clinic for PCSK9i therapy  Your physician wants you to follow-up in: 1 year with Dr. Mayford Knife. You will receive a reminder letter in the mail two months in advance. If you don't receive a letter, please call our office to schedule the follow-up appointment.  Any Other Special Instructions Will Be Listed Below (If Applicable).   Thank you for choosing [facility]    Lyda Perone, RN  [phone]  If you need a refill on your cardiac medications before your next appointment, please call your pharmacy.

## 2018-03-20 ENCOUNTER — Ambulatory Visit: Payer: Self-pay

## 2018-03-20 NOTE — Telephone Encounter (Signed)
Pt. Reports she has shortness of breath since March and "it is getting worse. I can't walk 25 feet or take a shower without getting short of breath." States when she breaths in she has "a lot of burning in my lungs." Saw her cardiologist yesterday and he instructed her to follow up with her PCP. Appointment made for tomorrow. Instructed if symptoms worsen to got to ED. Verbalizes understanding.  Reason for Disposition . [1] MODERATE longstanding difficulty breathing (e.g., speaks in phrases, SOB even at rest, pulse 100-120) AND [2] SAME as normal  Answer Assessment - Initial Assessment Questions 1. RESPIRATORY STATUS: "Describe your breathing?" (e.g., wheezing, shortness of breath, unable to speak, severe coughing)      Shortness of breath 2. ONSET: "When did this breathing problem begin?"      March 3. PATTERN "Does the difficult breathing come and go, or has it been constant since it started?"      Getting worse 4. SEVERITY: "How bad is your breathing?" (e.g., mild, moderate, severe)    - MILD: No SOB at rest, mild SOB with walking, speaks normally in sentences, can lay down, no retractions, pulse < 100.    - MODERATE: SOB at rest, SOB with minimal exertion and prefers to sit, cannot lie down flat, speaks in phrases, mild retractions, audible wheezing, pulse 100-120.    - SEVERE: Very SOB at rest, speaks in single words, struggling to breathe, sitting hunched forward, retractions, pulse > 120      Moderate 5. RECURRENT SYMPTOM: "Have you had difficulty breathing before?" If so, ask: "When was the last time?" and "What happened that time?"      Asthma 6. CARDIAC HISTORY: "Do you have any history of heart disease?" (e.g., heart attack, angina, bypass surgery, angioplasty)      Stents 7. LUNG HISTORY: "Do you have any history of lung disease?"  (e.g., pulmonary embolus, asthma, emphysema)     Asthma 8. CAUSE: "What do you think is causing the breathing problem?"      Unsure 9. OTHER SYMPTOMS:  "Do you have any other symptoms? (e.g., dizziness, runny nose, cough, chest pain, fever)     No 10. PREGNANCY: "Is there any chance you are pregnant?" "When was your last menstrual period?"       No 11. TRAVEL: "Have you traveled out of the country in the last month?" (e.g., travel history, exposures)       No  Protocols used: BREATHING DIFFICULTY-A-AH

## 2018-03-21 ENCOUNTER — Encounter: Payer: Self-pay | Admitting: Family Medicine

## 2018-03-21 ENCOUNTER — Ambulatory Visit: Payer: BLUE CROSS/BLUE SHIELD | Admitting: Family Medicine

## 2018-03-21 VITALS — BP 136/82 | HR 90 | Ht 61.0 in | Wt 296.2 lb

## 2018-03-21 DIAGNOSIS — K219 Gastro-esophageal reflux disease without esophagitis: Secondary | ICD-10-CM

## 2018-03-21 DIAGNOSIS — J4540 Moderate persistent asthma, uncomplicated: Secondary | ICD-10-CM | POA: Diagnosis not present

## 2018-03-21 MED ORDER — GI COCKTAIL ~~LOC~~
30.0000 mL | Freq: Once | ORAL | Status: AC
  Administered 2018-03-21: 30 mL via ORAL

## 2018-03-21 MED ORDER — OMEPRAZOLE 40 MG PO CPDR
40.0000 mg | DELAYED_RELEASE_CAPSULE | Freq: Every day | ORAL | 1 refills | Status: DC
Start: 2018-03-21 — End: 2018-04-19

## 2018-03-21 NOTE — Progress Notes (Signed)
Chief Complaint  Patient presents with  . Shortness of Breath    c/o worsening sob that is worse on exertion. Pt states that sx's have worsened over the past 2 months.     Subjective: Patient is a 57 y.o. female here for worsening sob.  Hx of asthma, currently on Breo and rescue inhaler.  Over the past 2 mo she has been having increased shortness of breath and dyspnea on exertion.  She will use her rescue inhaler and it was sometimes help.  The patient has not been compliant with her Breo.  She just forgets to use it.  This has been helpful for her asthma in the past.  She is concerned she may be developing COPD as well.  She has a 20-pack-year history of smoking.  She also has a history of reflux.  This is been acting up over the past 2 months as well.  She started taking over-the-counter omeprazole today.  She does elevate her head at night.   ROS: Heart: Denies chest pain  Lungs: +SOB   Past Medical History:  Diagnosis Date  . Asthma    QVAR daily and Albuterol as needed  . Chronic diastolic CHF (congestive heart failure) (HCC) 09/2015  . Chronic pain syndrome    Fentanyl Patch  . Colon polyps   . Coronary artery disease    cath with normal LM, 30% LAD, 85% mid RCA and 95% distal RCA s/p PCI of the mid to distal RCA and now on DAPT with ASA and Ticagrelor.    . Diabetes mellitus    takes Metformin and Hum R daily  . Eczema   . Excessive daytime sleepiness 11/26/2015  . Fibromyalgia   . Gallstones   . GERD (gastroesophageal reflux disease)   . Hyperlipidemia   . Hypertension    takes Metoprolol and Enalapril daily  . Hypothyroidism    takes Synthroid daily  . IBS (irritable bowel syndrome)   . Mixed connective tissue disease (HCC)   . NAFLD (nonalcoholic fatty liver disease)     Objective: BP 136/82 (BP Location: Left Wrist, Patient Position: Sitting, Cuff Size: Normal)   Pulse 90   Ht 5\' 1"  (1.549 m)   Wt 296 lb 3.2 oz (134.4 kg)   SpO2 94%   BMI 55.97 kg/m   General: Awake, appears stated age HEENT: MMM, EOMi Heart: RRR, no murmurs Lungs: CTAB, no rales, wheezes or rhonchi. No accessory muscle use Psych: Age appropriate judgment and insight, normal affect and mood  Assessment and Plan: Moderate persistent asthma, unspecified whether complicated  Gastroesophageal reflux disease, esophagitis presence not specified - Plan: omeprazole (PRILOSEC) 40 MG capsule  Cont SABA, go back on Breo. Consider LAMA vs Theophylline in future vs referral to pulm. 2 mo of PPI. Elevate hob, wt loss.  F/u in 1 mo.  The patient voiced understanding and agreement to the plan.  [place], DO 03/21/18  10:19 AM

## 2018-03-21 NOTE — Patient Instructions (Addendum)
Go back on Breo. Remember to rinse your mouth out after each use.   We have called in a rx strength omeprazole.  Let me know if medicines are too expensive.  Let us know if you need anything.

## 2018-03-21 NOTE — Addendum Note (Signed)
Addended by: Cammy Copa on: 03/21/2018 10:25 AM   Modules accepted: Orders

## 2018-04-03 ENCOUNTER — Telehealth (HOSPITAL_COMMUNITY): Payer: Self-pay | Admitting: *Deleted

## 2018-04-03 NOTE — Telephone Encounter (Signed)
Attempted to call patient regarding upcoming appointment- no answer, unable to leave message.  Iktan Aikman Jacqueline, RN 

## 2018-04-04 ENCOUNTER — Telehealth (HOSPITAL_COMMUNITY): Payer: Self-pay | Admitting: *Deleted

## 2018-04-04 NOTE — Telephone Encounter (Signed)
Left message on voicemail in reference to upcoming appointment scheduled for 04/09/18. Phone number given for a call back so details instructions can be given.  [name]

## 2018-04-08 ENCOUNTER — Other Ambulatory Visit: Payer: BLUE CROSS/BLUE SHIELD | Admitting: *Deleted

## 2018-04-08 ENCOUNTER — Ambulatory Visit (HOSPITAL_COMMUNITY): Payer: BLUE CROSS/BLUE SHIELD | Attending: Cardiovascular Disease

## 2018-04-08 DIAGNOSIS — I2510 Atherosclerotic heart disease of native coronary artery without angina pectoris: Secondary | ICD-10-CM

## 2018-04-08 DIAGNOSIS — E785 Hyperlipidemia, unspecified: Secondary | ICD-10-CM

## 2018-04-08 DIAGNOSIS — R0602 Shortness of breath: Secondary | ICD-10-CM | POA: Insufficient documentation

## 2018-04-08 LAB — HEPATIC FUNCTION PANEL
ALT: 46 IU/L — ABNORMAL HIGH (ref 0–32)
AST: 30 IU/L (ref 0–40)
Albumin: 4.1 g/dL (ref 3.5–5.5)
Alkaline Phosphatase: 71 IU/L (ref 39–117)
Bilirubin Total: 0.3 mg/dL (ref 0.0–1.2)
Bilirubin, Direct: 0.15 mg/dL (ref 0.00–0.40)
Total Protein: 6.6 g/dL (ref 6.0–8.5)

## 2018-04-08 LAB — LIPID PANEL
Chol/HDL Ratio: 4.5 ratio — ABNORMAL HIGH (ref 0.0–4.4)
Cholesterol, Total: 217 mg/dL — ABNORMAL HIGH (ref 100–199)
HDL: 48 mg/dL (ref >39)
LDL Calculated: 146 mg/dL — ABNORMAL HIGH (ref 0–99)
Triglycerides: 116 mg/dL (ref 0–149)
VLDL Cholesterol Cal: 23 mg/dL (ref 5–40)

## 2018-04-08 LAB — BASIC METABOLIC PANEL
BUN/Creatinine Ratio: 12 (ref 9–23)
BUN: 10 mg/dL (ref 6–24)
CO2: 25 mmol/L (ref 20–29)
Calcium: 9.7 mg/dL (ref 8.7–10.2)
Chloride: 99 mmol/L (ref 96–106)
Creatinine, Ser: 0.82 mg/dL (ref 0.57–1.00)
GFR calc Af Amer: 93 mL/min/{1.73_m2} (ref >59)
GFR calc non Af Amer: 80 mL/min/{1.73_m2} (ref >59)
Glucose: 97 mg/dL (ref 65–99)
Potassium: 4 mmol/L (ref 3.5–5.2)
Sodium: 139 mmol/L (ref 134–144)

## 2018-04-08 MED ORDER — TECHNETIUM TC 99M TETROFOSMIN IV KIT
32.7000 | PACK | Freq: Once | INTRAVENOUS | Status: AC | PRN
  Administered 2018-04-08: 32.7 via INTRAVENOUS
  Filled 2018-04-08: qty 33

## 2018-04-08 MED ORDER — REGADENOSON 0.4 MG/5ML IV SOLN
0.4000 mg | Freq: Once | INTRAVENOUS | Status: AC
  Administered 2018-04-08: 0.4 mg via INTRAVENOUS

## 2018-04-09 ENCOUNTER — Ambulatory Visit (HOSPITAL_COMMUNITY): Payer: BLUE CROSS/BLUE SHIELD

## 2018-04-09 ENCOUNTER — Other Ambulatory Visit: Payer: Self-pay

## 2018-04-09 ENCOUNTER — Telehealth: Payer: BLUE CROSS/BLUE SHIELD | Admitting: Nurse Practitioner

## 2018-04-09 ENCOUNTER — Ambulatory Visit (HOSPITAL_COMMUNITY): Payer: BLUE CROSS/BLUE SHIELD | Attending: Cardiology

## 2018-04-09 DIAGNOSIS — I10 Essential (primary) hypertension: Secondary | ICD-10-CM | POA: Diagnosis not present

## 2018-04-09 DIAGNOSIS — I2510 Atherosclerotic heart disease of native coronary artery without angina pectoris: Secondary | ICD-10-CM | POA: Diagnosis not present

## 2018-04-09 DIAGNOSIS — E785 Hyperlipidemia, unspecified: Secondary | ICD-10-CM | POA: Diagnosis not present

## 2018-04-09 DIAGNOSIS — N3000 Acute cystitis without hematuria: Secondary | ICD-10-CM

## 2018-04-09 DIAGNOSIS — Z87891 Personal history of nicotine dependence: Secondary | ICD-10-CM | POA: Insufficient documentation

## 2018-04-09 DIAGNOSIS — E119 Type 2 diabetes mellitus without complications: Secondary | ICD-10-CM | POA: Insufficient documentation

## 2018-04-09 DIAGNOSIS — R0602 Shortness of breath: Secondary | ICD-10-CM | POA: Diagnosis present

## 2018-04-09 LAB — MYOCARDIAL PERFUSION IMAGING
LV dias vol: 93 mL (ref 46–106)
LV sys vol: 30 mL
Peak HR: 103 {beats}/min
RATE: 0.41
Rest HR: 93 {beats}/min
SDS: 6
SRS: 4
SSS: 10
TID: 0.9

## 2018-04-09 MED ORDER — TECHNETIUM TC 99M TETROFOSMIN IV KIT
32.4000 | PACK | Freq: Once | INTRAVENOUS | Status: AC | PRN
  Administered 2018-04-09: 32.4 via INTRAVENOUS
  Filled 2018-04-09: qty 33

## 2018-04-09 MED ORDER — CEPHALEXIN 500 MG PO CAPS: 500.0000 mg | ORAL_CAPSULE | Freq: Two times a day (BID) | ORAL | 0 refills | Status: DC

## 2018-04-09 MED ORDER — PERFLUTREN LIPID MICROSPHERE
1.0000 mL | INTRAVENOUS | Status: AC | PRN
  Administered 2018-04-09: 2 mL via INTRAVENOUS

## 2018-04-09 NOTE — Progress Notes (Signed)

## 2018-04-10 ENCOUNTER — Telehealth: Payer: Self-pay

## 2018-04-10 DIAGNOSIS — E785 Hyperlipidemia, unspecified: Secondary | ICD-10-CM

## 2018-04-10 NOTE — Telephone Encounter (Signed)
Notes recorded by Sigurd Sos, RN on 04/10/2018 at 4:33 PM EDT Informed patient of results/recommendations. She verbalized understanding.

## 2018-04-16 NOTE — H&P (View-Only) (Signed)
Cardiology Office Note   Date:  04/17/2018   ID:  [name], DOB 05/11/1961, MRN [id number]  PCP:  Wendling, Nicholas Paul, DO  Cardiologist:  Dr. Traci Turner  Chief Complaint  Patient presents with  . Coronary Artery Disease    Abnormal stress      History of Present Illness: [name] is a 57 y.o. female who presents for ongoing assessment and management of ASCAD with normal LM 30%, LAD 85% mid RCA and 95% distal RCA s/p RC=PCI of the mid to distal RCA. She is on DAPT with ASA and Brilinta. She also has a history of hypercholesterolemia, hypertension,.   On last visit with Dr. Turner on 03/19/2018, she was complaining of heartburn and chest pain. She also has chronic LEE and is on diuretics prn.  She was ordered a Lexiscan Myoview and an echocardiogram to assess for progressive CAD.   Echocardiogram was revealed normal LVEF of 60% to 65%. No wall motion abnormalities. NM stress test, however was abnormal, with medium defect of moderate severity consistent with ischemia in the distal anterior and anteroseptal segments. She is here to discuss repeat cardiac cath, per Dr. Turner.   She states that she continues to have occasional chest discomfort usually while laying down or exerting herself. She does not have NTG.   Past Medical History:  Diagnosis Date  . Asthma    QVAR daily and Albuterol as needed  . Chronic diastolic CHF (congestive heart failure) (HCC) 09/2015  . Chronic pain syndrome    Fentanyl Patch  . Colon polyps   . Coronary artery disease    cath with normal LM, 30% LAD, 85% mid RCA and 95% distal RCA s/p PCI of the mid to distal RCA and now on DAPT with ASA and Ticagrelor.    . Diabetes mellitus    takes Metformin and Hum R daily  . Eczema   . Excessive daytime sleepiness 11/26/2015  . Fibromyalgia   . Gallstones   . GERD (gastroesophageal reflux disease)   . Hyperlipidemia   . Hypertension    takes Metoprolol and Enalapril daily  . Hypothyroidism    takes  Synthroid daily  . IBS (irritable bowel syndrome)   . Mixed connective tissue disease (HCC)   . NAFLD (nonalcoholic fatty liver disease)     Past Surgical History:  Procedure Laterality Date  . ABDOMINAL HYSTERECTOMY    . APPENDECTOMY    . BREAST BIOPSY Bilateral    9 total  . CARDIAC CATHETERIZATION    . cardiac stents N/A 05/23/2016   pt notified department upon arrival for exam ......2 stents  . CHOLECYSTECTOMY    . CORONARY ANGIOPLASTY     normal LM, 30% LAD, 85% mid RCA and 95% distal RCA s/p PCI of the mid to distal RCA and now on DAPT with ASA and Ticagrelor.    . LEG SURGERY Left    muscle biospy  . RADIOLOGY WITH ANESTHESIA N/A 05/25/2016   Procedure: RADIOLOGY WITH ANESTHESIA;  Surgeon: Medication Radiologist, MD;  Location: [facility] OR;  Service: Radiology;  Laterality: N/A;  . TONSILLECTOMY AND ADENOIDECTOMY    . WRIST SURGERY Left      Current Outpatient Medications  Medication Sig Dispense Refill  . albuterol (PROAIR HFA) 108 (90 Base) MCG/ACT inhaler Inhale 2 puffs into the lungs every 4 (four) hours as needed for wheezing or shortness of breath. 18 g 5  . aspirin EC 81 MG tablet Take 81 mg by mouth daily.    .   betamethasone dipropionate (DIPROLENE) 0.05 % cream APPLY  CREAM TOPICALLY ONCE DAILY AS NEEDED FOR  ECZEMA 0.05 g 0  . cephALEXin (KEFLEX) 500 MG capsule Take 1 capsule (500 mg total) by mouth 2 (two) times daily. 14 capsule 0  . diphenhydrAMINE (BENADRYL) 25 MG tablet Take 1 tablet (25 mg total) by mouth every 6 (six) hours as needed for itching or allergies. 10 tablet 0  . enalapril (VASOTEC) 10 MG tablet Take 1 tablet (10 mg total) 2 (two) times daily by mouth. 180 tablet 3  . EPINEPHrine (EPIPEN 2-PAK) 0.3 mg/0.3 mL IJ SOAJ injection USE AS DIRECTED FOR SEVERE ALLERGIC REACTION. 2 Device 3  . Estrogens, Conjugated (PREMARIN VA) Place 1 application vaginally 3 (three) times a week. On Saturdays, Mondays, and Wednesday (cream)    . fluticasone (FLONASE) 50  MCG/ACT nasal spray USE 2 SPRAY(S) IN EACH NOSTRIL ONCE DAILY AS NEEDED FOR ALLERGIES OR  RHINITIS 16 g 0  . fluticasone furoate-vilanterol (BREO ELLIPTA) 200-25 MCG/INH AEPB Inhale 1 puff into the lungs daily. 28 each 2  . furosemide (LASIX) 40 MG tablet Take 1 tablet (40 mg total) by mouth daily as needed for fluid. 30 tablet 0  . gabapentin (NEURONTIN) 100 MG capsule Take 400-700 mg by mouth See admin instructions. Pt takes 400mg in the morning, 400mg at lunchtime and 700mg in the evening    . GLUCOMANNAN PO Take 700 mg by mouth 2 (two) times daily.    . glucose blood (BAYER CONTOUR TEST) test strip CHECK BLOOD SUGAR 4 TIMES DAILY    . Guaifenesin (MUCINEX MAXIMUM STRENGTH) 1200 MG TB12 Take 1,200 mg by mouth 2 (two) times daily.    . hydroxychloroquine (PLAQUENIL) 200 MG tablet Take 200 mg by mouth 2 (two) times daily.    . insulin regular human CONCENTRATED (HUMULIN R) 500 UNIT/ML SOLN injection Inject 0-100 Units into the skin 4 (four) times daily -  before meals and at bedtime. Breakfast and lunch <100-125= 5 units 125-150= 35 u 151-200 = 45 u 201-249 = 55 u 250 - 300 = 65 u 301 - 349 = 75 u 350 - 400 = 85 u 401 - 449 = 95 u 450 - 500 = 105 u  bedtime doses <M100 - 150 = 10 u 151-200= 35 u 201 - 300 = 45u 301- 400 = 55 u 401 - 500 = 65 u    . ipratropium-albuterol (DUONEB) 0.5-2.5 (3) MG/3ML SOLN Take 3 mLs by nebulization every 4 (four) hours as needed (SOB, wheezing). 360 mL 1  . levocetirizine (XYZAL) 5 MG tablet TAKE 1 TABLET BY MOUTH ONCE DAILY IN THE EVENING 30 tablet 2  . levothyroxine (SYNTHROID, LEVOTHROID) 200 MCG tablet Take 200 mcg by mouth daily before breakfast. Except 100 mcg on Monday    . metFORMIN (GLUCOPHAGE-XR) 500 MG 24 hr tablet Take 1,000 mg by mouth 2 (two) times daily.  0  . metoprolol tartrate (LOPRESSOR) 25 MG tablet Take 1 tablet (25 mg total) by mouth 2 (two) times daily. Please make overdue yearly appt with Dr. Turner before anymore refills 2nd attempt  180 tablet 3  . milk thistle 175 MG tablet Take 175 mg by mouth 2 (two) times daily.     . mupirocin cream (BACTROBAN) 2 % Apply 1 application topically 2 (two) times daily.    . omeprazole (PRILOSEC) 40 MG capsule Take 1 capsule (40 mg total) by mouth daily. 30 capsule 1  . ondansetron (ZOFRAN) 4 MG tablet Take 4   mg by mouth every 8 (eight) hours as needed for nausea or vomiting.    . Oxycodone HCl 10 MG TABS Take 1 tablet by mouth 4 (four) times daily as needed.  0  . oxymetazoline (AFRIN) 0.05 % nasal spray Place 1 spray into both nostrils 2 (two) times daily as needed for congestion.    . Polyethyl Glycol-Propyl Glycol (SYSTANE ULTRA OP) Apply 1 drop to eye daily as needed (dry eyes).    . pramipexole (MIRAPEX) 0.5 MG tablet Take 0.5 mg by mouth 2 (two) times daily.    . promethazine (PHENERGAN) 25 MG tablet Take 1 tablet (25 mg total) by mouth every 8 (eight) hours as needed for nausea or vomiting. 30 tablet 0  . pyridoxine (B-6) 100 MG tablet Take 100 mg by mouth 2 (two) times daily.    . rotigotine (NEUPRO) 2 MG/24HR Place 1 patch onto the skin daily.    . saccharomyces boulardii (FLORASTOR) 250 MG capsule Take 1 capsule (250 mg total) by mouth 2 (two) times daily. (Patient taking differently: Take 250 mg by mouth 2 (two) times daily. Take 1 capsule with breakfast, 1 with lunch, and 2 at bedtime) 30 capsule 0  . silver sulfADIAZINE (SILVADENE) 1 % cream Apply 1 application topically daily. 50 g 2  . vitamin A 10000 UNIT capsule Take 10,000 Units by mouth daily.    . vitamin B-12 (CYANOCOBALAMIN) 1000 MCG tablet Take 1,000 mcg by mouth daily.      . Vitamin D, Ergocalciferol, (DRISDOL) 50000 units CAPS capsule 50,000 Units every 7 (seven) days. Take on Saturday evenings 12 capsule 9  . Vitamins-Lipotropics (LIPO-FLAVONOID PLUS PO) Take 1 tablet by mouth 3 (three) times daily.     No current facility-administered medications for this visit.     Allergies:   Fish allergy; Fish oil; Omega-3  fatty acids; Other; Crestor [rosuvastatin]; Gabapentin; Lovastatin; Metronidazole; Monascus purpureus went yeast; Red yeast rice [cholestin]; Rotigotine; Suprep [na sulfate-k sulfate-mg sulf]; and Tape    Social History:  The patient  reports that she quit smoking about 14 years ago. Her smoking use included cigarettes. She has a 20.00 pack-year smoking history. She has never used smokeless tobacco. She reports that she drinks alcohol. She reports that she does not use drugs.   Family History:  The patient's family history includes Allergic rhinitis in her father; Asthma in her father; Breast cancer in her paternal aunt; CAD in her father and mother; Heart attack in her father and mother; Hypertension in her brother, brother, and mother; Lung cancer in her paternal aunt; Pancreatic cancer in her paternal aunt.    ROS: All other systems are reviewed and negative. Unless otherwise mentioned in H&P    PHYSICAL EXAM: VS:  BP 128/72   Pulse 97   Ht 5' 1" (1.549 m)   Wt 298 lb 9.6 oz (135.4 kg)   BMI 56.42 kg/m  , BMI Body mass index is 56.42 kg/m. GEN: Well nourished, well developed, in no acute distress  Morbidly obese.  HEENT: normal  Neck: no JVD, carotid bruits, or masses Cardiac: RRR; 1/6 systolic murmur, tachycardic, rubs, or gallops,no edema  Respiratory:  clear to auscultation bilaterally, normal work of breathing GI: soft, nontender, nondistended, + BS MS: no deformity or atrophy  Skin: warm and dry, no rash Neuro:  Strength and sensation are intact Psych: euthymic mood, full affect   EKG:  NSR with inferior infarct noted. Mild LVH   Recent Labs: 01/02/2018: B Natriuretic Peptide 21.3;   TSH 1.388 01/06/2018: Hemoglobin 13.0; Magnesium 2.0; Platelets 250 04/08/2018: ALT 46; BUN 10; Creatinine, Ser 0.82; Potassium 4.0; Sodium 139    Lipid Panel    Component Value Date/Time   CHOL 217 (H) 04/08/2018 1150   TRIG 116 04/08/2018 1150   HDL 48 04/08/2018 1150   CHOLHDL 4.5 (H)  04/08/2018 1150   CHOLHDL 5.2 02/11/2015 0310   VLDL 28 02/11/2015 0310   LDLCALC 146 (H) 04/08/2018 1150      Wt Readings from Last 3 Encounters:  04/17/18 298 lb 9.6 oz (135.4 kg)  04/08/18 294 lb (133.4 kg)  03/21/18 296 lb 3.2 oz (134.4 kg)      Other studies Reviewed: NM Stress Test 04/09/2018  Study Highlights     The left ventricular ejection fraction is hyperdynamic (>65%).  Nuclear stress EF: 67%.  No T wave inversion was noted during stress.  There was no ST segment deviation noted during stress.  Defect 1: There is a medium defect of moderate severity.  Findings consistent with ischemia.  This is an intermediate risk study.   Medium size, moderate intensity reversible distal anterior and anteroseptal perfusion defect (SDS 5), suggestive of ischemia. LVEF 67% with normal wall motion. This is an intermediate risk study.     ASSESSMENT AND PLAN:  1.  Abnormal stress test: Medium sized moderate intensity reversible ischemia in the distal anterior and anterior septal defect. She will be scheduled for cardiac cath 04/18/2018 first case with Dr. Varanasi.    The patient understands that risks include but are not limited to stroke (1 in 1000), death (1 in 1000), kidney failure [usually temporary] (1 in 500), bleeding (1 in 200), allergic reaction [possibly serious] (1 in 200), and agrees to proceed.   I have prescribed NTG SL prn chest pain. If chest pain worsens before cardiac cath, she should call 911 after taking NTG and not finding relief.   2. CAD: Hx of LM 30%, LAD   85%,,85%  mid RCA 95%, distal RCA with PCI in 2017. Due to worsening symptoms stress test was completed and found to be abnormal. She is scheduled as above. She will continue on ASA, metoprolol, ACE.   3. Hypertension: BP is currently well controlled.   4. Morbid Obesity: Weight 298 lbs.   5. Chronic fibromyalgia: Continues on opioids for pain control.   6. Hypercholesterolemia: Intolerant of  statins. Can be considered for Repatha or lipid study drug on discharge.   7. Diabetes: On both insulin and metformin. She is advised to take 1/2 dose of insulin. Stop metformin for 48 hours.  Current medicines are reviewed at length with the patient today.    Labs/ tests ordered today include: Pre-Cath labs-CBC, INR.   Coran Dipaola M. Allysia Ingles DNP, ANP, AACC   04/17/2018 12:12 PM    [facility] [address], [place], [place] [address] Phone: [phone]; Fax: [phone] 

## 2018-04-16 NOTE — Progress Notes (Signed)
Cardiology Office Note   Date:  04/17/2018   ID:  Karter Haire, DOB [birth year], MRN [id number]  PCP:  Sharlene Dory, DO  Cardiologist:  Dr. Armanda Magic  Chief Complaint  Patient presents with  . Coronary Artery Disease    Abnormal stress      History of Present Illness: [name] is a 57 y.o. female who presents for ongoing assessment and management of ASCAD with normal LM 30%, LAD 85% mid RCA and 95% distal RCA s/p RC=PCI of the mid to distal RCA. She is on DAPT with ASA and Brilinta. She also has a history of hypercholesterolemia, hypertension,.   On last visit with Dr. Mayford Knife on 03/19/2018, she was complaining of heartburn and chest pain. She also has chronic LEE and is on diuretics prn.  She was ordered a [employer] and an echocardiogram to assess for progressive CAD.   Echocardiogram was revealed normal LVEF of 60% to 65%. No wall motion abnormalities. NM stress test, however was abnormal, with medium defect of moderate severity consistent with ischemia in the distal anterior and anteroseptal segments. She is here to discuss repeat cardiac cath, per Dr. Mayford Knife.   She states that she continues to have occasional chest discomfort usually while laying down or exerting herself. She does not have NTG.   Past Medical History:  Diagnosis Date  . Asthma    QVAR daily and Albuterol as needed  . Chronic diastolic CHF (congestive heart failure) (HCC) 09/2015  . Chronic pain syndrome    Fentanyl Patch  . Colon polyps   . Coronary artery disease    cath with normal LM, 30% LAD, 85% mid RCA and 95% distal RCA s/p PCI of the mid to distal RCA and now on DAPT with ASA and Ticagrelor.    . Diabetes mellitus    takes Metformin and Hum R daily  . Eczema   . Excessive daytime sleepiness 11/26/2015  . Fibromyalgia   . Gallstones   . GERD (gastroesophageal reflux disease)   . Hyperlipidemia   . Hypertension    takes Metoprolol and Enalapril daily  . Hypothyroidism    takes  Synthroid daily  . IBS (irritable bowel syndrome)   . Mixed connective tissue disease (HCC)   . NAFLD (nonalcoholic fatty liver disease)     Past Surgical History:  Procedure Laterality Date  . ABDOMINAL HYSTERECTOMY    . APPENDECTOMY    . BREAST BIOPSY Bilateral    9 total  . CARDIAC CATHETERIZATION    . cardiac stents N/A 05/23/2016   pt notified department upon arrival for exam .....Marland Kitchen2 stents  . CHOLECYSTECTOMY    . CORONARY ANGIOPLASTY     normal LM, 30% LAD, 85% mid RCA and 95% distal RCA s/p PCI of the mid to distal RCA and now on DAPT with ASA and Ticagrelor.    . LEG SURGERY Left    muscle biospy  . RADIOLOGY WITH ANESTHESIA N/A 05/25/2016   Procedure: RADIOLOGY WITH ANESTHESIA;  Surgeon: Medication Radiologist, MD;  Location: [facility] OR;  Service: Radiology;  Laterality: N/A;  . TONSILLECTOMY AND ADENOIDECTOMY    . WRIST SURGERY Left      Current Outpatient Medications  Medication Sig Dispense Refill  . albuterol (PROAIR HFA) 108 (90 Base) MCG/ACT inhaler Inhale 2 puffs into the lungs every 4 (four) hours as needed for wheezing or shortness of breath. 18 g 5  . aspirin EC 81 MG tablet Take 81 mg by mouth daily.    Marland Kitchen  betamethasone dipropionate (DIPROLENE) 0.05 % cream APPLY  CREAM TOPICALLY ONCE DAILY AS NEEDED FOR  ECZEMA 0.05 g 0  . cephALEXin (KEFLEX) 500 MG capsule Take 1 capsule (500 mg total) by mouth 2 (two) times daily. 14 capsule 0  . diphenhydrAMINE (BENADRYL) 25 MG tablet Take 1 tablet (25 mg total) by mouth every 6 (six) hours as needed for itching or allergies. 10 tablet 0  . enalapril (VASOTEC) 10 MG tablet Take 1 tablet (10 mg total) 2 (two) times daily by mouth. 180 tablet 3  . EPINEPHrine (EPIPEN 2-PAK) 0.3 mg/0.3 mL IJ SOAJ injection USE AS DIRECTED FOR SEVERE ALLERGIC REACTION. 2 Device 3  . Estrogens, Conjugated (PREMARIN VA) Place 1 application vaginally 3 (three) times a week. On Saturdays, Mondays, and Wednesday (cream)    . fluticasone (FLONASE) 50  MCG/ACT nasal spray USE 2 SPRAY(S) IN EACH NOSTRIL ONCE DAILY AS NEEDED FOR ALLERGIES OR  RHINITIS 16 g 0  . fluticasone furoate-vilanterol (BREO ELLIPTA) 200-25 MCG/INH AEPB Inhale 1 puff into the lungs daily. 28 each 2  . furosemide (LASIX) 40 MG tablet Take 1 tablet (40 mg total) by mouth daily as needed for fluid. 30 tablet 0  . gabapentin (NEURONTIN) 100 MG capsule Take 400-700 mg by mouth See admin instructions. Pt takes 400mg  in the morning, 400mg  at lunchtime and 700mg  in the evening    . GLUCOMANNAN PO Take 700 mg by mouth 2 (two) times daily.    Marland Kitchen glucose blood (BAYER CONTOUR TEST) test strip CHECK BLOOD SUGAR 4 TIMES DAILY    . Guaifenesin (MUCINEX MAXIMUM STRENGTH) 1200 MG TB12 Take 1,200 mg by mouth 2 (two) times daily.    . hydroxychloroquine (PLAQUENIL) 200 MG tablet Take 200 mg by mouth 2 (two) times daily.    . insulin regular human CONCENTRATED (HUMULIN R) 500 UNIT/ML SOLN injection Inject 0-100 Units into the skin 4 (four) times daily -  before meals and at bedtime. Breakfast and lunch <100-125= 5 units 125-150= 35 u 151-200 = 45 u 201-249 = 55 u 250 - 300 = 65 u 301 - 349 = 75 u 350 - 400 = 85 u 401 - 449 = 95 u 450 - 500 = 105 u  bedtime doses <M100 - 150 = 10 u 151-200= 35 u 201 - 300 = 45u 301- 400 = 55 u 401 - 500 = 65 u    . ipratropium-albuterol (DUONEB) 0.5-2.5 (3) MG/3ML SOLN Take 3 mLs by nebulization every 4 (four) hours as needed (SOB, wheezing). 360 mL 1  . levocetirizine (XYZAL) 5 MG tablet TAKE 1 TABLET BY MOUTH ONCE DAILY IN THE EVENING 30 tablet 2  . levothyroxine (SYNTHROID, LEVOTHROID) 200 MCG tablet Take 200 mcg by mouth daily before breakfast. Except 100 mcg on Monday    . metFORMIN (GLUCOPHAGE-XR) 500 MG 24 hr tablet Take 1,000 mg by mouth 2 (two) times daily.  0  . metoprolol tartrate (LOPRESSOR) 25 MG tablet Take 1 tablet (25 mg total) by mouth 2 (two) times daily. Please make overdue yearly appt with Dr. Mayford Knife before anymore refills 2nd attempt  180 tablet 3  . milk thistle 175 MG tablet Take 175 mg by mouth 2 (two) times daily.     . mupirocin cream (BACTROBAN) 2 % Apply 1 application topically 2 (two) times daily.    Marland Kitchen omeprazole (PRILOSEC) 40 MG capsule Take 1 capsule (40 mg total) by mouth daily. 30 capsule 1  . ondansetron (ZOFRAN) 4 MG tablet Take 4  mg by mouth every 8 (eight) hours as needed for nausea or vomiting.    . Oxycodone HCl 10 MG TABS Take 1 tablet by mouth 4 (four) times daily as needed.  0  . oxymetazoline (AFRIN) 0.05 % nasal spray Place 1 spray into both nostrils 2 (two) times daily as needed for congestion.    Bertram Gala Glycol-Propyl Glycol (SYSTANE ULTRA OP) Apply 1 drop to eye daily as needed (dry eyes).    . pramipexole (MIRAPEX) 0.5 MG tablet Take 0.5 mg by mouth 2 (two) times daily.    . promethazine (PHENERGAN) 25 MG tablet Take 1 tablet (25 mg total) by mouth every 8 (eight) hours as needed for nausea or vomiting. 30 tablet 0  . pyridoxine (B-6) 100 MG tablet Take 100 mg by mouth 2 (two) times daily.    . rotigotine (NEUPRO) 2 MG/24HR Place 1 patch onto the skin daily.    Marland Kitchen saccharomyces boulardii (FLORASTOR) 250 MG capsule Take 1 capsule (250 mg total) by mouth 2 (two) times daily. (Patient taking differently: Take 250 mg by mouth 2 (two) times daily. Take 1 capsule with breakfast, 1 with lunch, and 2 at bedtime) 30 capsule 0  . silver sulfADIAZINE (SILVADENE) 1 % cream Apply 1 application topically daily. 50 g 2  . vitamin A 47096 UNIT capsule Take 10,000 Units by mouth daily.    . vitamin B-12 (CYANOCOBALAMIN) 1000 MCG tablet Take 1,000 mcg by mouth daily.      . Vitamin D, Ergocalciferol, (DRISDOL) 50000 units CAPS capsule 50,000 Units every 7 (seven) days. Take on Saturday evenings 12 capsule 9  . Vitamins-Lipotropics (LIPO-FLAVONOID PLUS PO) Take 1 tablet by mouth 3 (three) times daily.     No current facility-administered medications for this visit.     Allergies:   Fish allergy; Fish oil; Omega-3  fatty acids; Other; Crestor [rosuvastatin]; Gabapentin; Lovastatin; Metronidazole; Monascus purpureus went yeast; Red yeast rice [cholestin]; Rotigotine; Suprep [na sulfate-k sulfate-mg sulf]; and Tape    Social History:  The patient  reports that she quit smoking about 14 years ago. Her smoking use included cigarettes. She has a 20.00 pack-year smoking history. She has never used smokeless tobacco. She reports that she drinks alcohol. She reports that she does not use drugs.   Family History:  The patient's family history includes Allergic rhinitis in her father; Asthma in her father; Breast cancer in her paternal aunt; CAD in her father and mother; Heart attack in her father and mother; Hypertension in her brother, brother, and mother; Lung cancer in her paternal aunt; Pancreatic cancer in her paternal aunt.    ROS: All other systems are reviewed and negative. Unless otherwise mentioned in H&P    PHYSICAL EXAM: VS:  BP 128/72   Pulse 97   Ht 5\' 1"  (1.549 m)   Wt 298 lb 9.6 oz (135.4 kg)   BMI 56.42 kg/m  , BMI Body mass index is 56.42 kg/m. GEN: Well nourished, well developed, in no acute distress  Morbidly obese.  HEENT: normal  Neck: no JVD, carotid bruits, or masses Cardiac: RRR; 1/6 systolic murmur, tachycardic, rubs, or gallops,no edema  Respiratory:  clear to auscultation bilaterally, normal work of breathing GI: soft, nontender, nondistended, + BS MS: no deformity or atrophy  Skin: warm and dry, no rash Neuro:  Strength and sensation are intact Psych: euthymic mood, full affect   EKG:  NSR with inferior infarct noted. Mild LVH   Recent Labs: 01/02/2018: B Natriuretic Peptide 21.3;  TSH 1.388 01/06/2018: Hemoglobin 13.0; Magnesium 2.0; Platelets 250 04/08/2018: ALT 46; BUN 10; Creatinine, Ser 0.82; Potassium 4.0; Sodium 139    Lipid Panel    Component Value Date/Time   CHOL 217 (H) 04/08/2018 1150   TRIG 116 04/08/2018 1150   HDL 48 04/08/2018 1150   CHOLHDL 4.5 (H)  04/08/2018 1150   CHOLHDL 5.2 02/11/2015 0310   VLDL 28 02/11/2015 0310   LDLCALC 146 (H) 04/08/2018 1150      Wt Readings from Last 3 Encounters:  04/17/18 298 lb 9.6 oz (135.4 kg)  04/08/18 294 lb (133.4 kg)  03/21/18 296 lb 3.2 oz (134.4 kg)      Other studies Reviewed: NM Stress Test Apr 19, 2018  Study Highlights     The left ventricular ejection fraction is hyperdynamic (>65%).  Nuclear stress EF: 67%.  No T wave inversion was noted during stress.  There was no ST segment deviation noted during stress.  Defect 1: There is a medium defect of moderate severity.  Findings consistent with ischemia.  This is an intermediate risk study.   Medium size, moderate intensity reversible distal anterior and anteroseptal perfusion defect (SDS 5), suggestive of ischemia. LVEF 67% with normal wall motion. This is an intermediate risk study.     ASSESSMENT AND PLAN:  1.  Abnormal stress test: Medium sized moderate intensity reversible ischemia in the distal anterior and anterior septal defect. She will be scheduled for cardiac cath 04/18/2018 first case with Dr. Eldridge Dace.    The patient understands that risks include but are not limited to stroke (1 in 1000), death (1 in 1000), kidney failure [usually temporary] (1 in 500), bleeding (1 in 200), allergic reaction [possibly serious] (1 in 200), and agrees to proceed.   I have prescribed NTG SL prn chest pain. If chest pain worsens before cardiac cath, she should call 911 after taking NTG and not finding relief.   2. CAD: Hx of LM 30%, LAD   85%,,85%  mid RCA 95%, distal RCA with PCI in 2017. Due to worsening symptoms stress test was completed and found to be abnormal. She is scheduled as above. She will continue on ASA, metoprolol, ACE.   3. Hypertension: BP is currently well controlled.   4. Morbid Obesity: Weight 298 lbs.   5. Chronic fibromyalgia: Continues on opioids for pain control.   6. Hypercholesterolemia: Intolerant of  statins. Can be considered for Repatha or lipid study drug on discharge.   7. Diabetes: On both insulin and metformin. She is advised to take 1/2 dose of insulin. Stop metformin for 48 hours.  Current medicines are reviewed at length with the patient today.    Labs/ tests ordered today include: Pre-Cath labs-CBC, INR.   Bettey Mare. Liborio Nixon, ANP, AACC   04/17/2018 12:12 PM     [facility] [address] [place], [place] [address] Phone: [phone]; Fax: [phone]

## 2018-04-17 ENCOUNTER — Encounter: Payer: Self-pay | Admitting: Adult Health

## 2018-04-17 ENCOUNTER — Ambulatory Visit: Payer: BLUE CROSS/BLUE SHIELD | Admitting: Adult Health

## 2018-04-17 VITALS — BP 128/72 | HR 97 | Ht 61.0 in | Wt 298.6 lb

## 2018-04-17 DIAGNOSIS — I2510 Atherosclerotic heart disease of native coronary artery without angina pectoris: Secondary | ICD-10-CM

## 2018-04-17 DIAGNOSIS — R931 Abnormal findings on diagnostic imaging of heart and coronary circulation: Secondary | ICD-10-CM | POA: Diagnosis not present

## 2018-04-17 DIAGNOSIS — E119 Type 2 diabetes mellitus without complications: Secondary | ICD-10-CM

## 2018-04-17 DIAGNOSIS — IMO0001 Reserved for inherently not codable concepts without codable children: Secondary | ICD-10-CM

## 2018-04-17 DIAGNOSIS — Z794 Long term (current) use of insulin: Secondary | ICD-10-CM

## 2018-04-17 DIAGNOSIS — E7800 Pure hypercholesterolemia, unspecified: Secondary | ICD-10-CM | POA: Diagnosis not present

## 2018-04-17 DIAGNOSIS — I10 Essential (primary) hypertension: Secondary | ICD-10-CM

## 2018-04-17 MED ORDER — NITROGLYCERIN 0.4 MG SL SUBL: 0.4000 mg | SUBLINGUAL_TABLET | SUBLINGUAL | 3 refills | Status: DC | PRN

## 2018-04-17 NOTE — Patient Instructions (Addendum)
  [name]  04/17/2018  You are scheduled for a Cardiac Catheterization on Thursday, August 8 with Dr. Verne Carrow.  1. Please arrive at the [facility] (Main Entrance A) at [facility]: [address] [place], [place] [address] at 5:30 AM (This time is two hours before your procedure to ensure your preparation). Free valet parking service is available.   Special note: Every effort is made to have your procedure done on time. Please understand that emergencies sometimes delay scheduled procedures.  2. Diet: Do not eat solid foods after midnight.  The patient may have clear liquids until 5am upon the day of the procedure.  3. Medication instructions in preparation for your procedure:  Stop taking ENALAPRIL AND LASIX  THE DAY OF THE PROCEDURE  Take only 1/2 Dose of your insulin the night before your procedure. Do not take any insulin on the day of the procedure.  Stop Taking PO Diabetes Meds Glucophage (Metformin)on Thursday, August 8. AND 48 HOURS( 2 DAYS AFTER CATH PROCEDURE, UNLESS DIRECTED BY CATH STAFF TO DO OTHERWISE).  On the morning of your procedure, take your Aspirin and any morning medicines NOT listed above.  You may use sips of water.  5. Plan for one night stay--bring personal belongings. 6. Bring a current list of your medications and current insurance cards. 7. You MUST have a responsible person to drive you home. 8. Someone MUST be with you the first 24 hours after you arrive home or your discharge will be delayed. 9. Please wear clothes that are easy to get on and off and wear slip-on shoes.  Thank you for allowing [place] to care for you!   -- [facility]

## 2018-04-17 NOTE — Addendum Note (Signed)
Addended by: Alyson Ingles on: 04/17/2018 01:49 PM   Modules accepted: Orders

## 2018-04-18 ENCOUNTER — Other Ambulatory Visit: Payer: Self-pay

## 2018-04-18 ENCOUNTER — Encounter (HOSPITAL_COMMUNITY)
Admission: RE | Disposition: A | Discharge status: Home / Self Care | Payer: Self-pay | Source: Ambulatory Visit | Attending: Cardiovascular Disease

## 2018-04-18 ENCOUNTER — Encounter (HOSPITAL_COMMUNITY): Payer: Self-pay | Admitting: Cardiovascular Disease

## 2018-04-18 ENCOUNTER — Ambulatory Visit (HOSPITAL_COMMUNITY)
Admission: RE | Admit: 2018-04-18 | Discharge: 2018-04-19 | Disposition: A | Discharge status: Home / Self Care | Payer: BLUE CROSS/BLUE SHIELD | Source: Ambulatory Visit | Attending: Cardiovascular Disease | Admitting: Cardiovascular Disease

## 2018-04-18 DIAGNOSIS — Z794 Long term (current) use of insulin: Secondary | ICD-10-CM | POA: Insufficient documentation

## 2018-04-18 DIAGNOSIS — Z7951 Long term (current) use of inhaled steroids: Secondary | ICD-10-CM | POA: Diagnosis not present

## 2018-04-18 DIAGNOSIS — I5032 Chronic diastolic (congestive) heart failure: Secondary | ICD-10-CM | POA: Diagnosis not present

## 2018-04-18 DIAGNOSIS — Z9861 Coronary angioplasty status: Secondary | ICD-10-CM

## 2018-04-18 DIAGNOSIS — G894 Chronic pain syndrome: Secondary | ICD-10-CM | POA: Insufficient documentation

## 2018-04-18 DIAGNOSIS — M797 Fibromyalgia: Secondary | ICD-10-CM | POA: Insufficient documentation

## 2018-04-18 DIAGNOSIS — Z7982 Long term (current) use of aspirin: Secondary | ICD-10-CM | POA: Diagnosis not present

## 2018-04-18 DIAGNOSIS — J45909 Unspecified asthma, uncomplicated: Secondary | ICD-10-CM | POA: Insufficient documentation

## 2018-04-18 DIAGNOSIS — Z9889 Other specified postprocedural states: Secondary | ICD-10-CM | POA: Insufficient documentation

## 2018-04-18 DIAGNOSIS — Z79899 Other long term (current) drug therapy: Secondary | ICD-10-CM | POA: Insufficient documentation

## 2018-04-18 DIAGNOSIS — Z91013 Allergy to seafood: Secondary | ICD-10-CM | POA: Diagnosis not present

## 2018-04-18 DIAGNOSIS — Z90710 Acquired absence of both cervix and uterus: Secondary | ICD-10-CM | POA: Insufficient documentation

## 2018-04-18 DIAGNOSIS — I2510 Atherosclerotic heart disease of native coronary artery without angina pectoris: Secondary | ICD-10-CM

## 2018-04-18 DIAGNOSIS — I200 Unstable angina: Secondary | ICD-10-CM

## 2018-04-18 DIAGNOSIS — Z8249 Family history of ischemic heart disease and other diseases of the circulatory system: Secondary | ICD-10-CM | POA: Insufficient documentation

## 2018-04-18 DIAGNOSIS — Z87891 Personal history of nicotine dependence: Secondary | ICD-10-CM | POA: Insufficient documentation

## 2018-04-18 DIAGNOSIS — I10 Essential (primary) hypertension: Secondary | ICD-10-CM

## 2018-04-18 DIAGNOSIS — E119 Type 2 diabetes mellitus without complications: Secondary | ICD-10-CM | POA: Insufficient documentation

## 2018-04-18 DIAGNOSIS — Z6841 Body Mass Index (BMI) 40.0 and over, adult: Secondary | ICD-10-CM | POA: Insufficient documentation

## 2018-04-18 DIAGNOSIS — K760 Fatty (change of) liver, not elsewhere classified: Secondary | ICD-10-CM | POA: Diagnosis not present

## 2018-04-18 DIAGNOSIS — K219 Gastro-esophageal reflux disease without esophagitis: Secondary | ICD-10-CM | POA: Diagnosis not present

## 2018-04-18 DIAGNOSIS — R9439 Abnormal result of other cardiovascular function study: Secondary | ICD-10-CM | POA: Diagnosis not present

## 2018-04-18 DIAGNOSIS — Q219 Congenital malformation of cardiac septum, unspecified: Secondary | ICD-10-CM | POA: Insufficient documentation

## 2018-04-18 DIAGNOSIS — Z91018 Allergy to other foods: Secondary | ICD-10-CM | POA: Insufficient documentation

## 2018-04-18 DIAGNOSIS — Z955 Presence of coronary angioplasty implant and graft: Secondary | ICD-10-CM

## 2018-04-18 DIAGNOSIS — Z888 Allergy status to other drugs, medicaments and biological substances status: Secondary | ICD-10-CM | POA: Insufficient documentation

## 2018-04-18 DIAGNOSIS — I110 Hypertensive heart disease with heart failure: Secondary | ICD-10-CM | POA: Insufficient documentation

## 2018-04-18 DIAGNOSIS — E039 Hypothyroidism, unspecified: Secondary | ICD-10-CM | POA: Diagnosis not present

## 2018-04-18 DIAGNOSIS — K589 Irritable bowel syndrome without diarrhea: Secondary | ICD-10-CM | POA: Diagnosis not present

## 2018-04-18 DIAGNOSIS — I25110 Atherosclerotic heart disease of native coronary artery with unstable angina pectoris: Secondary | ICD-10-CM | POA: Diagnosis not present

## 2018-04-18 DIAGNOSIS — Z86010 Personal history of colonic polyps: Secondary | ICD-10-CM | POA: Insufficient documentation

## 2018-04-18 DIAGNOSIS — M351 Other overlap syndromes: Secondary | ICD-10-CM | POA: Diagnosis not present

## 2018-04-18 DIAGNOSIS — E7800 Pure hypercholesterolemia, unspecified: Secondary | ICD-10-CM | POA: Insufficient documentation

## 2018-04-18 DIAGNOSIS — Z881 Allergy status to other antibiotic agents status: Secondary | ICD-10-CM | POA: Diagnosis not present

## 2018-04-18 DIAGNOSIS — Z9049 Acquired absence of other specified parts of digestive tract: Secondary | ICD-10-CM | POA: Insufficient documentation

## 2018-04-18 HISTORY — DX: Migraine, unspecified, not intractable, without status migrainosus: G43.909

## 2018-04-18 HISTORY — DX: Type 2 diabetes mellitus without complications: E11.9

## 2018-04-18 HISTORY — DX: Low back pain, unspecified: M54.50

## 2018-04-18 HISTORY — DX: Spondylosis, unspecified: M47.9

## 2018-04-18 HISTORY — DX: Low back pain: M54.5

## 2018-04-18 HISTORY — DX: Cardiac murmur, unspecified: R01.1

## 2018-04-18 HISTORY — DX: Scoliosis, unspecified: M41.9

## 2018-04-18 HISTORY — DX: Personal history of other diseases of the digestive system: Z87.19

## 2018-04-18 HISTORY — DX: Other chronic pain: G89.29

## 2018-04-18 HISTORY — DX: Cervicalgia: M54.2

## 2018-04-18 HISTORY — PX: CORONARY STENT INTERVENTION: CATH118234

## 2018-04-18 HISTORY — DX: Rheumatoid arthritis, unspecified: M06.9

## 2018-04-18 HISTORY — DX: Personal history of other diseases of the musculoskeletal system and connective tissue: Z87.39

## 2018-04-18 HISTORY — DX: Personal history of other medical treatment: Z92.89

## 2018-04-18 HISTORY — PX: LEFT HEART CATH AND CORONARY ANGIOGRAPHY: CATH118249

## 2018-04-18 LAB — CBC
HCT: 44.4 % (ref 36.0–46.0)
Hemoglobin: 13.9 g/dL (ref 12.0–15.0)
MCH: 27.9 pg (ref 26.0–34.0)
MCHC: 31.3 g/dL (ref 30.0–36.0)
MCV: 89 fL (ref 78.0–100.0)
Platelets: 246 10*3/uL (ref 150–400)
RBC: 4.99 MIL/uL (ref 3.87–5.11)
RDW: 13.2 % (ref 11.5–15.5)
WBC: 7.3 10*3/uL (ref 4.0–10.5)

## 2018-04-18 LAB — GLUCOSE, CAPILLARY
Glucose-Capillary: 105 mg/dL — ABNORMAL HIGH (ref 70–99)
Glucose-Capillary: 92 mg/dL (ref 70–99)
Glucose-Capillary: 288 mg/dL — ABNORMAL HIGH (ref 70–99)
Glucose-Capillary: 266 mg/dL — ABNORMAL HIGH (ref 70–99)
Glucose-Capillary: 134 mg/dL — ABNORMAL HIGH (ref 70–99)

## 2018-04-18 LAB — POCT ACTIVATED CLOTTING TIME: Activated Clotting Time: 235 seconds

## 2018-04-18 LAB — MRSA PCR SCREENING: MRSA by PCR: NEGATIVE

## 2018-04-18 SURGERY — LEFT HEART CATH AND CORONARY ANGIOGRAPHY
Anesthesia: LOCAL | Laterality: Right

## 2018-04-18 MED ORDER — NITROGLYCERIN 0.4 MG SL SUBL: 0.4000 mg | SUBLINGUAL_TABLET | SUBLINGUAL | Status: DC | PRN

## 2018-04-18 MED ORDER — FENTANYL CITRATE (PF) 100 MCG/2ML IJ SOLN
INTRAMUSCULAR | Status: AC
  Filled 2018-04-18: qty 2

## 2018-04-18 MED ORDER — SODIUM CHLORIDE 0.9 % WEIGHT BASED INFUSION: 1.0000 mL/kg/h | INTRAVENOUS | Status: DC

## 2018-04-18 MED ORDER — INSULIN REGULAR HUMAN (CONC) 500 UNIT/ML ~~LOC~~ SOPN
0.0000 [IU] | PEN_INJECTOR | Freq: Three times a day (TID) | SUBCUTANEOUS | Status: DC
  Administered 2018-04-18: 65 [IU] via SUBCUTANEOUS
  Administered 2018-04-19: 55 [IU] via SUBCUTANEOUS
  Filled 2018-04-18 (×2): qty 3

## 2018-04-18 MED ORDER — GABAPENTIN 400 MG PO CAPS: 400.0000 mg | ORAL_CAPSULE | ORAL | Status: DC

## 2018-04-18 MED ORDER — INSULIN REGULAR HUMAN (CONC) 500 UNIT/ML ~~LOC~~ SOLN
0.0000 [IU] | Freq: Every day | SUBCUTANEOUS | Status: DC
  Filled 2018-04-18: qty 0.13

## 2018-04-18 MED ORDER — CEPHALEXIN 500 MG PO CAPS
500.0000 mg | ORAL_CAPSULE | Freq: Two times a day (BID) | ORAL | Status: DC
  Filled 2018-04-18 (×2): qty 1

## 2018-04-18 MED ORDER — HEPARIN (PORCINE) IN NACL 1000-0.9 UT/500ML-% IV SOLN
INTRAVENOUS | Status: AC
  Filled 2018-04-18: qty 1000

## 2018-04-18 MED ORDER — ALBUTEROL SULFATE (2.5 MG/3ML) 0.083% IN NEBU
2.5000 mg | INHALATION_SOLUTION | RESPIRATORY_TRACT | Status: DC | PRN
  Administered 2018-04-18: 2.5 mg via RESPIRATORY_TRACT
  Filled 2018-04-18: qty 3

## 2018-04-18 MED ORDER — OXYCODONE HCL 5 MG PO TABS
ORAL_TABLET | ORAL | Status: AC
  Filled 2018-04-18: qty 2

## 2018-04-18 MED ORDER — SODIUM CHLORIDE 0.9 % IV SOLN: 250.0000 mL | INTRAVENOUS | Status: DC | PRN

## 2018-04-18 MED ORDER — ASPIRIN EC 81 MG PO TBEC
81.0000 mg | DELAYED_RELEASE_TABLET | Freq: Every day | ORAL | Status: DC
  Administered 2018-04-19: 81 mg via ORAL
  Filled 2018-04-18: qty 1

## 2018-04-18 MED ORDER — HEPARIN (PORCINE) IN NACL 1000-0.9 UT/500ML-% IV SOLN
INTRAVENOUS | Status: DC | PRN
  Administered 2018-04-18 (×2): 500 mL

## 2018-04-18 MED ORDER — SODIUM CHLORIDE 0.9% FLUSH: 3.0000 mL | Freq: Two times a day (BID) | INTRAVENOUS | Status: DC

## 2018-04-18 MED ORDER — ASPIRIN 81 MG PO CHEW: 81.0000 mg | CHEWABLE_TABLET | ORAL | Status: DC

## 2018-04-18 MED ORDER — GABAPENTIN 400 MG PO CAPS
400.0000 mg | ORAL_CAPSULE | Freq: Two times a day (BID) | ORAL | Status: DC
  Administered 2018-04-19: 08:00:00 400 mg via ORAL
  Filled 2018-04-18: qty 1

## 2018-04-18 MED ORDER — OXYCODONE HCL 5 MG PO TABS
10.0000 mg | ORAL_TABLET | Freq: Four times a day (QID) | ORAL | Status: DC | PRN
  Administered 2018-04-18 – 2018-04-19 (×3): 10 mg via ORAL
  Filled 2018-04-18 (×2): qty 2

## 2018-04-18 MED ORDER — CLOPIDOGREL BISULFATE 75 MG PO TABS
75.0000 mg | ORAL_TABLET | Freq: Every day | ORAL | Status: DC
  Administered 2018-04-19: 75 mg via ORAL
  Filled 2018-04-18: qty 1

## 2018-04-18 MED ORDER — ACETAMINOPHEN 325 MG PO TABS: 650.0000 mg | ORAL_TABLET | ORAL | Status: DC | PRN

## 2018-04-18 MED ORDER — SODIUM CHLORIDE 0.9% FLUSH: 3.0000 mL | INTRAVENOUS | Status: DC | PRN

## 2018-04-18 MED ORDER — METOPROLOL TARTRATE 25 MG PO TABS
25.0000 mg | ORAL_TABLET | Freq: Two times a day (BID) | ORAL | Status: DC
  Administered 2018-04-18 – 2018-04-19 (×2): 25 mg via ORAL
  Filled 2018-04-18 (×2): qty 1

## 2018-04-18 MED ORDER — HEPARIN SODIUM (PORCINE) 1000 UNIT/ML IJ SOLN
INTRAMUSCULAR | Status: DC | PRN
  Administered 2018-04-18: 4000 [IU] via INTRAVENOUS
  Administered 2018-04-18: 5000 [IU] via INTRAVENOUS
  Administered 2018-04-18: 7000 [IU] via INTRAVENOUS

## 2018-04-18 MED ORDER — VERAPAMIL HCL 2.5 MG/ML IV SOLN
INTRAVENOUS | Status: DC | PRN
  Administered 2018-04-18: 10 mL via INTRA_ARTERIAL

## 2018-04-18 MED ORDER — INSULIN REGULAR HUMAN (CONC) 500 UNIT/ML ~~LOC~~ SOLN
0.0000 [IU] | Freq: Three times a day (TID) | SUBCUTANEOUS | Status: DC
  Filled 2018-04-18: qty 20

## 2018-04-18 MED ORDER — SODIUM CHLORIDE 0.9 % IV SOLN: INTRAVENOUS | Status: AC

## 2018-04-18 MED ORDER — CLOPIDOGREL BISULFATE 300 MG PO TABS
ORAL_TABLET | ORAL | Status: DC | PRN
  Administered 2018-04-18: 600 mg via ORAL

## 2018-04-18 MED ORDER — INSULIN REGULAR HUMAN (CONC) 500 UNIT/ML ~~LOC~~ SOPN: 0.0000 [IU] | PEN_INJECTOR | Freq: Every day | SUBCUTANEOUS | Status: DC

## 2018-04-18 MED ORDER — ANGIOPLASTY BOOK
Freq: Once | Status: AC
  Administered 2018-04-18: 23:00:00
  Filled 2018-04-18: qty 1

## 2018-04-18 MED ORDER — MIDAZOLAM HCL 2 MG/2ML IJ SOLN
INTRAMUSCULAR | Status: DC | PRN
  Administered 2018-04-18 (×2): 1 mg via INTRAVENOUS

## 2018-04-18 MED ORDER — INSULIN REGULAR HUMAN (CONC) 500 UNIT/ML ~~LOC~~ SOPN
0.0000 [IU] | PEN_INJECTOR | Freq: Three times a day (TID) | SUBCUTANEOUS | Status: DC
  Filled 2018-04-18: qty 3

## 2018-04-18 MED ORDER — HYDROXYCHLOROQUINE SULFATE 200 MG PO TABS
200.0000 mg | ORAL_TABLET | Freq: Two times a day (BID) | ORAL | Status: DC
  Administered 2018-04-18 – 2018-04-19 (×2): 200 mg via ORAL
  Filled 2018-04-18 (×2): qty 1

## 2018-04-18 MED ORDER — FLUTICASONE FUROATE-VILANTEROL 200-25 MCG/INH IN AEPB
1.0000 | INHALATION_SPRAY | Freq: Every day | RESPIRATORY_TRACT | Status: DC
  Administered 2018-04-19: 08:00:00 1 via RESPIRATORY_TRACT
  Filled 2018-04-18: qty 28

## 2018-04-18 MED ORDER — LIDOCAINE HCL (PF) 1 % IJ SOLN
INTRAMUSCULAR | Status: DC | PRN
  Administered 2018-04-18: 2 mL via INTRADERMAL

## 2018-04-18 MED ORDER — PRAMIPEXOLE DIHYDROCHLORIDE 0.25 MG PO TABS
0.5000 mg | ORAL_TABLET | Freq: Two times a day (BID) | ORAL | Status: DC
  Administered 2018-04-18 – 2018-04-19 (×2): 0.5 mg via ORAL
  Filled 2018-04-18 (×2): qty 2

## 2018-04-18 MED ORDER — LABETALOL HCL 5 MG/ML IV SOLN: 10.0000 mg | INTRAVENOUS | Status: AC | PRN

## 2018-04-18 MED ORDER — IOHEXOL 350 MG/ML SOLN
INTRAVENOUS | Status: DC | PRN
  Administered 2018-04-18: 180 mL via INTRA_ARTERIAL

## 2018-04-18 MED ORDER — ALBUTEROL SULFATE (2.5 MG/3ML) 0.083% IN NEBU
INHALATION_SOLUTION | RESPIRATORY_TRACT | Status: AC
  Filled 2018-04-18: qty 3

## 2018-04-18 MED ORDER — ENALAPRIL MALEATE 10 MG PO TABS
10.0000 mg | ORAL_TABLET | Freq: Two times a day (BID) | ORAL | Status: DC
Start: 2018-04-18 — End: 2018-04-19
  Administered 2018-04-18 – 2018-04-19 (×2): 10 mg via ORAL
  Filled 2018-04-18 (×2): qty 1

## 2018-04-18 MED ORDER — HEPARIN SODIUM (PORCINE) 1000 UNIT/ML IJ SOLN
INTRAMUSCULAR | Status: AC
  Filled 2018-04-18: qty 1

## 2018-04-18 MED ORDER — SODIUM CHLORIDE 0.9 % WEIGHT BASED INFUSION
3.0000 mL/kg/h | INTRAVENOUS | Status: DC
  Administered 2018-04-18: 3 mL/kg/h via INTRAVENOUS

## 2018-04-18 MED ORDER — HYDRALAZINE HCL 20 MG/ML IJ SOLN: 5.0000 mg | INTRAMUSCULAR | Status: AC | PRN

## 2018-04-18 MED ORDER — VERAPAMIL HCL 2.5 MG/ML IV SOLN
INTRAVENOUS | Status: AC
  Filled 2018-04-18: qty 2

## 2018-04-18 MED ORDER — LEVOTHYROXINE SODIUM 100 MCG PO TABS
200.0000 ug | ORAL_TABLET | Freq: Every day | ORAL | Status: DC
  Administered 2018-04-19: 200 ug via ORAL
  Filled 2018-04-18: qty 2

## 2018-04-18 MED ORDER — MIDAZOLAM HCL 2 MG/2ML IJ SOLN
INTRAMUSCULAR | Status: AC
  Filled 2018-04-18: qty 2

## 2018-04-18 MED ORDER — INSULIN ASPART 100 UNIT/ML ~~LOC~~ SOLN
0.0000 [IU] | Freq: Three times a day (TID) | SUBCUTANEOUS | Status: DC
  Administered 2018-04-18: 11 [IU] via SUBCUTANEOUS

## 2018-04-18 MED ORDER — FAMOTIDINE IN NACL 20-0.9 MG/50ML-% IV SOLN
INTRAVENOUS | Status: AC | PRN
  Administered 2018-04-18: 20 mg via INTRAVENOUS

## 2018-04-18 MED ORDER — FENTANYL CITRATE (PF) 100 MCG/2ML IJ SOLN
INTRAMUSCULAR | Status: DC | PRN
  Administered 2018-04-18 (×2): 25 ug via INTRAVENOUS

## 2018-04-18 MED ORDER — LIDOCAINE HCL (PF) 1 % IJ SOLN
INTRAMUSCULAR | Status: AC
  Filled 2018-04-18: qty 30

## 2018-04-18 MED ORDER — FAMOTIDINE IN NACL 20-0.9 MG/50ML-% IV SOLN
INTRAVENOUS | Status: AC
  Filled 2018-04-18: qty 50

## 2018-04-18 MED ORDER — GUAIFENESIN ER 600 MG PO TB12
1200.0000 mg | ORAL_TABLET | Freq: Two times a day (BID) | ORAL | Status: DC
  Administered 2018-04-18 – 2018-04-19 (×2): 1200 mg via ORAL
  Filled 2018-04-18 (×2): qty 2

## 2018-04-18 MED ORDER — ONDANSETRON HCL 4 MG/2ML IJ SOLN: 4.0000 mg | Freq: Four times a day (QID) | INTRAMUSCULAR | Status: DC | PRN

## 2018-04-18 MED ORDER — GABAPENTIN 400 MG PO CAPS
700.0000 mg | ORAL_CAPSULE | Freq: Every day | ORAL | Status: DC
  Administered 2018-04-18: 700 mg via ORAL
  Filled 2018-04-18: qty 1

## 2018-04-18 MED ORDER — IPRATROPIUM-ALBUTEROL 0.5-2.5 (3) MG/3ML IN SOLN
RESPIRATORY_TRACT | Status: AC
  Filled 2018-04-18: qty 3

## 2018-04-18 SURGICAL SUPPLY — 17 items
BALLN SAPPHIRE 2.5X12 (BALLOONS) ×3
BALLN SAPPHIRE ~~LOC~~ 3.25X10 (BALLOONS) ×3 IMPLANT
BALLOON SAPPHIRE 2.5X12 (BALLOONS) ×2 IMPLANT
CATH 5FR JL3.5 JR4 ANG PIG MP (CATHETERS) ×3 IMPLANT
CATH VISTA GUIDE 6FR JR4 (CATHETERS) ×3 IMPLANT
CATHETER LAUNCHER 6FR JR4 SH (CATHETERS) ×3 IMPLANT
DEVICE RAD COMP TR BAND LRG (VASCULAR PRODUCTS) ×3 IMPLANT
GLIDESHEATH SLEND SS 6F .021 (SHEATH) ×3 IMPLANT
GUIDEWIRE INQWIRE 1.5J.035X260 (WIRE) ×2 IMPLANT
INQWIRE 1.5J .035X260CM (WIRE) ×3
KIT ENCORE 26 ADVANTAGE (KITS) ×3 IMPLANT
KIT HEART LEFT (KITS) ×3 IMPLANT
PACK CARDIAC CATHETERIZATION (CUSTOM PROCEDURE TRAY) ×3 IMPLANT
STENT SIERRA 3.00 X 12 MM (Permanent Stent) ×3 IMPLANT
TRANSDUCER W/STOPCOCK (MISCELLANEOUS) ×3 IMPLANT
TUBING CIL FLEX 10 FLL-RA (TUBING) ×3 IMPLANT
WIRE COUGAR XT STRL 190CM (WIRE) ×3 IMPLANT

## 2018-04-18 NOTE — Interval H&P Note (Signed)
History and Physical Interval Note:  04/18/2018 7:20 AM  [name]  has presented today for cardiac cath with the diagnosis of unstable angina, Abnormal Stress Test  The various methods of treatment have been discussed with the patient and family. After consideration of risks, benefits and other options for treatment, the patient has consented to  Procedure(s): LEFT HEART CATH AND CORONARY ANGIOGRAPHY (N/A) as a surgical intervention .  The patient's history has been reviewed, patient examined, no change in status, stable for surgery.  I have reviewed the patient's chart and labs.  Questions were answered to the patient's satisfaction.    Cath Lab Visit (complete for each Cath Lab visit)  Clinical Evaluation Leading to the Procedure:   ACS: No.  Non-ACS:    Anginal Classification: CCS II  Anti-ischemic medical therapy: Minimal Therapy (1 class of medications)  Non-Invasive Test Results: Intermediate-risk stress test findings: cardiac mortality 1-3%/year  Prior CABG: No previous CABG         Verne Carrow

## 2018-04-18 NOTE — Progress Notes (Signed)
Ambulated to BR; back to stretcher. Tolerated activity well

## 2018-04-18 NOTE — Progress Notes (Signed)
TR BAND REMOVAL  LOCATION:    Radial rt radial  DEFLATED PER PROTOCOL:   yes  TIME BAND OFF / DRESSING APPLIED:    1345/gauze and tegaderm  SITE UPON ARRIVAL:    Level  0  SITE AFTER BAND REMOVAL:    Level  0  CIRCULATION SENSATION AND MOVEMENT:    Within Normal Limits :   Rt hand and fingers warm and pink, sensation present, palpable radial  COMMENTS:

## 2018-04-19 ENCOUNTER — Other Ambulatory Visit: Payer: Self-pay

## 2018-04-19 DIAGNOSIS — I200 Unstable angina: Secondary | ICD-10-CM

## 2018-04-19 DIAGNOSIS — I10 Essential (primary) hypertension: Secondary | ICD-10-CM | POA: Diagnosis not present

## 2018-04-19 DIAGNOSIS — I2510 Atherosclerotic heart disease of native coronary artery without angina pectoris: Secondary | ICD-10-CM | POA: Diagnosis not present

## 2018-04-19 DIAGNOSIS — R9439 Abnormal result of other cardiovascular function study: Secondary | ICD-10-CM | POA: Diagnosis not present

## 2018-04-19 DIAGNOSIS — I5032 Chronic diastolic (congestive) heart failure: Secondary | ICD-10-CM | POA: Diagnosis not present

## 2018-04-19 DIAGNOSIS — Z9861 Coronary angioplasty status: Secondary | ICD-10-CM

## 2018-04-19 DIAGNOSIS — I25110 Atherosclerotic heart disease of native coronary artery with unstable angina pectoris: Secondary | ICD-10-CM | POA: Diagnosis not present

## 2018-04-19 DIAGNOSIS — I110 Hypertensive heart disease with heart failure: Secondary | ICD-10-CM | POA: Diagnosis not present

## 2018-04-19 LAB — CBC
HCT: 40.1 % (ref 36.0–46.0)
Hemoglobin: 12.3 g/dL (ref 12.0–15.0)
MCH: 27.8 pg (ref 26.0–34.0)
MCHC: 30.7 g/dL (ref 30.0–36.0)
MCV: 90.5 fL (ref 78.0–100.0)
Platelets: 184 10*3/uL (ref 150–400)
RBC: 4.43 MIL/uL (ref 3.87–5.11)
RDW: 13.2 % (ref 11.5–15.5)
WBC: 6.6 10*3/uL (ref 4.0–10.5)

## 2018-04-19 LAB — BASIC METABOLIC PANEL
Anion gap: 12 (ref 5–15)
BUN: 10 mg/dL (ref 6–20)
CO2: 26 mmol/L (ref 22–32)
Calcium: 9.3 mg/dL (ref 8.9–10.3)
Chloride: 102 mmol/L (ref 98–111)
Creatinine, Ser: 0.94 mg/dL (ref 0.44–1.00)
GFR calc Af Amer: >60 mL/min (ref >60)
GFR calc non Af Amer: >60 mL/min (ref >60)
Glucose, Bld: 234 mg/dL — ABNORMAL HIGH (ref 70–99)
Potassium: 4.1 mmol/L (ref 3.5–5.1)
Sodium: 140 mmol/L (ref 135–145)

## 2018-04-19 LAB — GLUCOSE, CAPILLARY: Glucose-Capillary: 238 mg/dL — ABNORMAL HIGH (ref 70–99)

## 2018-04-19 MED ORDER — PANTOPRAZOLE SODIUM 40 MG PO TBEC: 40.0000 mg | DELAYED_RELEASE_TABLET | Freq: Every day | ORAL | 3 refills | Status: DC

## 2018-04-19 MED ORDER — CLOPIDOGREL BISULFATE 75 MG PO TABS: 75.0000 mg | ORAL_TABLET | Freq: Every day | ORAL | 11 refills | Status: DC

## 2018-04-19 MED ORDER — NITROGLYCERIN 0.4 MG SL SUBL: 0.4000 mg | SUBLINGUAL_TABLET | SUBLINGUAL | 2 refills | Status: DC | PRN

## 2018-04-19 MED ORDER — METFORMIN HCL ER 500 MG PO TB24: 1000.0000 mg | ORAL_TABLET | Freq: Two times a day (BID) | ORAL | 0 refills | Status: DC

## 2018-04-19 NOTE — Progress Notes (Signed)
CARDIAC REHAB PHASE I   PRE:  Rate/Rhythm: 95 SR    BP: sitting 125/56    SaO2:   MODE:  Ambulation: 250 ft   POST:  Rate/Rhythm: 120 ST    BP: sitting 176/83     SaO2:   Pt DOE with distance but sts this is much better than PTA. She walked farther, HR up to 120 ST, BP elevated as well. Pt is quite deconditioned and obese. She has not been able to walk farther than her kitchen recently. Ed completed with pt and husband including Plavix/ASA, diet, ex, NTG, and CRPII. Good reception. They have been doing Paleo diet with some success. She needs more ex too and is very interested in CRPII. Will refer to [place] CRPII.  [id number]   Harriet Masson CES, ACSM 04/19/2018 10:02 AM

## 2018-04-19 NOTE — Discharge Instructions (Signed)
Talk to your primary care provider about the amount of folic acid you are currently taking daily.   Do no lift anything heavier than 1/2 gallon of milk for the next 48 hrs

## 2018-04-19 NOTE — Progress Notes (Addendum)
Patient telemetry shows brief episodes of desaturation to mid 70"s to mid 80's overnight.  Patient states that she has never used oxygen at home.  Also states that a sleep study done about one year ago was negative.

## 2018-04-19 NOTE — Discharge Summary (Signed)
Discharge Summary    Patient ID: [name],  MRN: [id number], DOB/AGE: [birth year] 57 y.o.  Admit date: 04/18/2018 Discharge date: 04/19/2018  Primary Care Provider: Sharlene Dory Primary Cardiologist: Dr. Mayford Knife   Discharge Diagnoses    Principal Problem:   CAD S/P percutaneous coronary angioplasty Active Problems:   Essential hypertension   Allergies Allergies  Allergen Reactions  . Fish Allergy Anaphylaxis    INCLUDES OMEGA 3 OILS  . Fish Oil Anaphylaxis and Swelling    THROAT SWELLS INCLUDES FISH AS A CLASS  . Omega-3 Fatty Acids Swelling    Throat swelling  Throat swelling   . Other Anaphylaxis, Other (See Comments), Nausea And Vomiting and Shortness Of Breath    UNSPECIFIED REACTION  Perch Other reaction(s): Other (See Comments) INCLUDES OMEGA 3 OILS Perch UNSPECIFIED REACTION  Perch  . Crestor [Rosuvastatin] Other (See Comments)    Extreme joint pain, to this & other statins  . Gabapentin Anxiety    Anxiety on high doses  . Lovastatin Other (See Comments)    EXTREME JOINT PAIN  . Metronidazole Nausea Only and Swelling    Nausea and vomiting  Other reaction(s): Other (See Comments) Headache and shakes Nausea and vomiting  . Monascus Purpureus Went Yeast Other (See Comments)    Muscle pain and severe joint pain.   . Red Yeast Rice [Cholestin] Other (See Comments)    Muscle pain and severe joint pain.   . Rotigotine Swelling    Extreme edema   . Atrovent Hfa [Ipratropium Bromide Hfa] Other (See Comments)    wheezing  . Suprep [Na Sulfate-K Sulfate-Mg Sulf] Nausea And Vomiting  . Tape Other (See Comments)    Electrodes causes skin breakdown    Diagnostic Studies/Procedures   Procedures   CORONARY STENT INTERVENTION  LEFT HEART CATH AND CORONARY ANGIOGRAPHY 04/18/18  Conclusion     Mid RCA lesion is 10% stenosed.  Dist RCA lesion is 10% stenosed.  Ost RCA lesion is 99% stenosed.  Ost LAD to Prox LAD lesion is 30%  stenosed.  A drug-eluting stent was successfully placed using a STENT SIERRA 3.00 X 12 MM.  Post intervention, there is a 0% residual stenosis.  Prox Cx to Mid Cx lesion is 20% stenosed.   1. Severe stenosis ostial RCA. 2. Patent stents mid and distal RCA with minimal restenosis.  3. Mild non-obstructive disease in the LAD and Circumflex 4. Successful PTCA/DES x 1 ostial RCA  Recommendations:  Recommend uninterrupted dual antiplatelet therapy with Aspirin 81mg  daily and Clopidogrel 75mg  daily for a minimum of 6 months (stable ischemic heart disease - Class I recommendation). If she tolerates, DAPT, would continue for one year.      History of Present Illness     [name] is a 57 y.o. female with known ASCAD with prior cath showing normal LM, 30%, LAD, 85% mid RCA and 95% distal RCA s/p RC=PCI of the mid to distal RCA. She also has a history of hypercholesterolemia, hypertension and obesity.  On last office visit with Dr. Mayford Knife on 03/19/2018, she was complaining of heartburn and chest pain. She also has chronic LEE and is on diuretics prn.  She was ordered a [employer] and an echocardiogram to assess for progressive CAD.   Echocardiogram was revealed normal LVEF of 60% to 65%. No wall motion abnormalities. NM stress test, however was abnormal, with medium defect of moderate severity consistent with ischemia in the distal anterior and anteroseptal segments. Subsequently, she  was referred for definitive cath and possible intervention.   Hospital Course     Pt presented to [facility] on 04/18/18 for the planned procedure, performed by Dr. Clifton James. Access was obtained via the right radial artery. She was found to have severe stenosis of the ostial RCA, 99% stenosed, treated with successful PCI + DES placement. Post intervention, there was 0% residual stenosis. The previously placed stents in the mid and distal RCA were patent with only minimal stenosis. The LAD had only mild, 20%, disease.  Pt tolerated the procedure well and left the cath lab in stable condition. She was monitored overnight and had no post cath complications. She denied recurrent CP and no dyspnea. No exertional symptoms ambulating with cardiac rehab. Her radial cath access site remained stable as well as renal function and vital signs. She was placed on DAPT w/ ASA and Plavix, to be continued for a minimum of 6 months, per cath report. If she is able to tolerate DAPT, 1 year of therapy would be preferred. She was also continued on BB therapy with metoprolol as well as an ACEi. No statin due to history of statin intolerance (failed Crestor, Lipitor and lovastatin). Referral to Lipid Clinic for PCSK9 inhibitor therapy recently recommended. Evaluation pending. LDL 04/08/18 was 146. Management per lipid clinic and LDL will be followed closely. LDL goal is < 70 mg/dL.   Pt was seen and examined by Dr. Mayford Knife on 04/19/18 and felt stable for discharge home. Post hospital f/u will be arranged in 7-14 days.    Consultants: none   Discharge Vitals Blood pressure 117/73, pulse 91, temperature 98 F (36.7 C), resp. rate 16, height 5\' 1"  (1.549 m), weight (!) 136.1 kg, SpO2 96 %.  Filed Weights   04/18/18 0544 04/19/18 0451  Weight: 133.4 kg (!) 136.1 kg    Labs & Radiologic Studies    CBC Recent Labs    04/18/18 0633 04/19/18 0324  WBC 7.3 6.6  HGB 13.9 12.3  HCT 44.4 40.1  MCV 89.0 90.5  PLT 246 184   Basic Metabolic Panel Recent Labs    06/19/18 0324  NA 140  K 4.1  CL 102  CO2 26  GLUCOSE 234*  BUN 10  CREATININE 0.94  CALCIUM 9.3   Liver Function Tests No results for input(s): AST, ALT, ALKPHOS, BILITOT, PROT, ALBUMIN in the last 72 hours. No results for input(s): LIPASE, AMYLASE in the last 72 hours. Cardiac Enzymes No results for input(s): CKTOTAL, CKMB, CKMBINDEX, TROPONINI in the last 72 hours. BNP Invalid input(s): POCBNP D-Dimer No results for input(s): DDIMER in the last 72  hours. Hemoglobin A1C No results for input(s): HGBA1C in the last 72 hours. Fasting Lipid Panel No results for input(s): CHOL, HDL, LDLCALC, TRIG, CHOLHDL, LDLDIRECT in the last 72 hours. Thyroid Function Tests No results for input(s): TSH, T4TOTAL, T3FREE, THYROIDAB in the last 72 hours.  Invalid input(s): FREET3 _____________  No results found. Disposition   Pt is being discharged home today in good condition.  Follow-up Plans & Appointments    Follow-up Information    16/10/96, MD Follow up.   Specialty:  Cardiology Why:  our office will call you with a hospital follow-up visit in 1-2 weeks. Contact information: [address] [place] [phone]          Discharge Instructions    Diet - low sodium heart healthy   Complete by:  As directed    Increase activity  slowly   Complete by:  As directed       Discharge Medications   Allergies as of 04/19/2018      Reactions   Fish Allergy Anaphylaxis   INCLUDES OMEGA 3 OILS   Fish Oil Anaphylaxis, Swelling   THROAT SWELLS INCLUDES FISH AS A CLASS   Omega-3 Fatty Acids Swelling   Throat swelling  Throat swelling    Other Anaphylaxis, Other (See Comments), Nausea And Vomiting, Shortness Of Breath   UNSPECIFIED REACTION  Perch Other reaction(s): Other (See Comments) INCLUDES OMEGA 3 OILS Perch UNSPECIFIED REACTION  Perch   Crestor [rosuvastatin] Other (See Comments)   Extreme joint pain, to this & other statins   Gabapentin Anxiety   Anxiety on high doses   Lovastatin Other (See Comments)   EXTREME JOINT PAIN   Metronidazole Nausea Only, Swelling   Nausea and vomiting Other reaction(s): Other (See Comments) Headache and shakes Nausea and vomiting   Monascus Purpureus Went Yeast Other (See Comments)   Muscle pain and severe joint pain.    Red Yeast Rice [cholestin] Other (See Comments)   Muscle pain and severe joint pain.    Rotigotine Swelling   Extreme edema    Atrovent Hfa [ipratropium Bromide Hfa] Other (See Comments)   wheezing   Suprep [na Sulfate-k Sulfate-mg Sulf] Nausea And Vomiting   Tape Other (See Comments)   Electrodes causes skin breakdown      Medication List    STOP taking these medications   FOLIC ACID PO   omeprazole 40 MG capsule Commonly known as:  PRILOSEC     TAKE these medications   albuterol 108 (90 Base) MCG/ACT inhaler Commonly known as:  PROVENTIL HFA;VENTOLIN HFA Inhale 2 puffs into the lungs every 4 (four) hours as needed for wheezing or shortness of breath.   aspirin EC 81 MG tablet Take 81 mg by mouth daily.   BAYER CONTOUR TEST test strip Generic drug:  glucose blood 1 each 4 (four) times daily.   betamethasone dipropionate 0.05 % cream Commonly known as:  DIPROLENE APPLY  CREAM TOPICALLY ONCE DAILY AS NEEDED FOR  ECZEMA What changed:  See the new instructions.   cephALEXin 500 MG capsule Commonly known as:  KEFLEX Take 1 capsule (500 mg total) by mouth 2 (two) times daily.   clopidogrel 75 MG tablet Commonly known as:  PLAVIX Take 1 tablet (75 mg total) by mouth daily with breakfast. Start taking on:  04/20/2018   diphenhydrAMINE 25 MG tablet Commonly known as:  BENADRYL Take 1 tablet (25 mg total) by mouth every 6 (six) hours as needed for itching or allergies.   enalapril 10 MG tablet Commonly known as:  VASOTEC Take 1 tablet (10 mg total) 2 (two) times daily by mouth.   EPINEPHrine 0.3 mg/0.3 mL Soaj injection Commonly known as:  EPI-PEN USE AS DIRECTED FOR SEVERE ALLERGIC REACTION.   fluticasone 50 MCG/ACT nasal spray Commonly known as:  FLONASE USE 2 SPRAY(S) IN EACH NOSTRIL ONCE DAILY AS NEEDED FOR ALLERGIES OR  RHINITIS   fluticasone furoate-vilanterol 200-25 MCG/INH Aepb Commonly known as:  BREO ELLIPTA Inhale 1 puff into the lungs daily.   furosemide 40 MG tablet Commonly known as:  LASIX Take 1 tablet (40 mg total) by mouth daily as needed for fluid.   gabapentin 100  MG capsule Commonly known as:  NEURONTIN Take 400-700 mg by mouth See admin instructions. Pt takes 400mg  in the morning, 400mg  at lunchtime and 700mg  in the evening  GLUCOMANNAN PO Take 700 mg by mouth 2 (two) times daily.   HUMULIN R 500 UNIT/ML injection Generic drug:  insulin regular human CONCENTRATED Inject 0-100 Units into the skin 4 (four) times daily -  before meals and at bedtime. Breakfast and lunch <100-125= 5 units 125-150= 35 u 151-200 = 45 u 201-249 = 55 u 250 - 300 = 65 u 301 - 349 = 75 u 350 - 400 = 85 u 401 - 449 = 95 u 450 - 500 = 105 u  bedtime doses <M100 - 150 = 10 u 151-200= 35 u 201 - 300 = 45u 301- 400 = 55 u 401 - 500 = 65 u   hydroxychloroquine 200 MG tablet Commonly known as:  PLAQUENIL Take 200 mg by mouth 2 (two) times daily.   ipratropium-albuterol 0.5-2.5 (3) MG/3ML Soln Commonly known as:  DUONEB Take 3 mLs by nebulization every 4 (four) hours as needed (SOB, wheezing).   levocetirizine 5 MG tablet Commonly known as:  XYZAL TAKE 1 TABLET BY MOUTH ONCE DAILY IN THE EVENING   levothyroxine 200 MCG tablet Commonly known as:  SYNTHROID, LEVOTHROID Take 200 mcg by mouth daily before breakfast. Except 100 mcg on Monday   LIPO-FLAVONOID PLUS PO Take 1 tablet by mouth 3 (three) times daily.   metFORMIN 500 MG 24 hr tablet Commonly known as:  GLUCOPHAGE-XR Take 2 tablets (1,000 mg total) by mouth 2 (two) times daily. Start taking on:  04/21/2018 What changed:  These instructions start on 04/21/2018. If you are unsure what to do until then, ask your doctor or other care provider.   metoprolol tartrate 25 MG tablet Commonly known as:  LOPRESSOR Take 1 tablet (25 mg total) by mouth 2 (two) times daily. Please make overdue yearly appt with Dr. Mayford Knife before anymore refills 2nd attempt   milk thistle 175 MG tablet Take 175 mg by mouth 2 (two) times daily.   MUCINEX MAXIMUM STRENGTH 1200 MG Tb12 Generic drug:  Guaifenesin Take 1,200 mg by  mouth 2 (two) times daily.   mupirocin cream 2 % Commonly known as:  BACTROBAN Apply 1 application topically 2 (two) times daily.   nitroGLYCERIN 0.4 MG SL tablet Commonly known as:  NITROSTAT Place 1 tablet (0.4 mg total) under the tongue every 5 (five) minutes as needed for up to 25 doses for chest pain.   ondansetron 4 MG tablet Commonly known as:  ZOFRAN Take 4 mg by mouth every 8 (eight) hours as needed for nausea or vomiting.   Oxycodone HCl 10 MG Tabs Take 1 tablet by mouth 3 (three) times daily as needed (pain).   oxymetazoline 0.05 % nasal spray Commonly known as:  AFRIN Place 1 spray into both nostrils 2 (two) times daily as needed for congestion.   pantoprazole 40 MG tablet Commonly known as:  PROTONIX Take 1 tablet (40 mg total) by mouth daily.   pramipexole 0.5 MG tablet Commonly known as:  MIRAPEX Take 0.5 mg by mouth 2 (two) times daily.   PREMARIN VA Place 1 application vaginally 3 (three) times a week. On Saturdays, Mondays, and Wednesday (cream)   promethazine 25 MG tablet Commonly known as:  PHENERGAN Take 1 tablet (25 mg total) by mouth every 8 (eight) hours as needed for nausea or vomiting.   pyridoxine 100 MG tablet Commonly known as:  B-6 Take 100 mg by mouth 2 (two) times daily.   rotigotine 2 MG/24HR Commonly known as:  NEUPRO Place 1 patch onto the skin  daily.   saccharomyces boulardii 250 MG capsule Commonly known as:  FLORASTOR Take 1 capsule (250 mg total) by mouth 2 (two) times daily. What changed:  additional instructions   silver sulfADIAZINE 1 % cream Commonly known as:  SILVADENE Apply 1 application topically daily.   SYSTANE COMPLETE OP Apply 1 drop to eye as needed (dry eyes).   SYSTANE ULTRA OP Apply 1 drop to eye daily as needed (dry eyes).   vitamin A 40981 UNIT capsule Take 10,000 Units by mouth daily.   vitamin B-12 1000 MCG tablet Commonly known as:  CYANOCOBALAMIN Take 1,000 mcg by mouth daily.   Vitamin D  (Ergocalciferol) 50000 units Caps capsule Commonly known as:  DRISDOL 50,000 Units every 7 (seven) days. Take on Friday        Acute coronary syndrome (MI, NSTEMI, STEMI, etc) this admission?: No.    Outstanding Labs/Studies   none  Duration of Discharge Encounter   Greater than 30 minutes including physician time.  Signed, Robbie Lis, PA-C 04/19/2018, 9:24 AM

## 2018-04-24 ENCOUNTER — Encounter: Payer: Self-pay | Admitting: Family Medicine

## 2018-04-24 ENCOUNTER — Ambulatory Visit: Payer: BLUE CROSS/BLUE SHIELD | Admitting: Family Medicine

## 2018-04-24 VITALS — BP 120/72 | HR 109 | Temp 98.6°F | Ht 61.0 in | Wt 297.5 lb

## 2018-04-24 DIAGNOSIS — J45909 Unspecified asthma, uncomplicated: Secondary | ICD-10-CM | POA: Diagnosis not present

## 2018-04-24 MED ORDER — LEVOCETIRIZINE DIHYDROCHLORIDE 5 MG PO TABS: 5.0000 mg | ORAL_TABLET | Freq: Every evening | ORAL | 1 refills | Status: DC

## 2018-04-24 NOTE — Patient Instructions (Signed)
Goal Weight: Lose 40 lbs  Keep up the good work.   Set short term goals and long term goals!  Let us know if you need anything.

## 2018-04-24 NOTE — Progress Notes (Signed)
Pre visit review using our clinic review tool, if applicable. No additional management support is needed unless otherwise documented below in the visit note. 

## 2018-04-24 NOTE — Progress Notes (Signed)
Chief Complaint  Patient presents with  . Follow-up    breathing    Subjective: Patient is a 57 y.o. female here for f/u sob.  Pt was being tx'd for what we thought was uncontrolled asthma. Found to have 99% blockage of coronary artery. Stent placed, feeling much better. She has started [employer] and has lost a few lbs. Still gets DOE, but improved. No wheezing or cough.    ROS: Heart: Denies chest pain  Lungs: Denies SOB   Past Medical History:  Diagnosis Date  . Asthma    "on daily RX and rescue inhaler" (04/18/2018)  . Chronic diastolic CHF (congestive heart failure) (HCC) 09/2015  . Chronic lower back pain   . Chronic neck pain   . Chronic pain syndrome    Fentanyl Patch  . Colon polyps   . Coronary artery disease    cath with normal LM, 30% LAD, 85% mid RCA and 95% distal RCA s/p PCI of the mid to distal RCA and now on DAPT with ASA and Ticagrelor.    . Eczema   . Excessive daytime sleepiness 11/26/2015  . Fibromyalgia   . Gallstones   . GERD (gastroesophageal reflux disease)   . Heart murmur    "noted for the 1st time on 04/18/2018"  . History of blood transfusion 07/2010   "S/P oophorectomy"  . History of gout   . History of hiatal hernia 1980s   "gone now" (04/18/2018)  . Hyperlipidemia   . Hypertension    takes Metoprolol and Enalapril daily  . Hypothyroidism    takes Synthroid daily  . IBS (irritable bowel syndrome)   . Migraine    "nothing in the 2000s" (04/18/2018)  . Mixed connective tissue disease (HCC)   . NAFLD (nonalcoholic fatty liver disease)   . Pneumonia    "several times" (04/18/2018)  . Rheumatoid arthritis (HCC)    "hands, elbows, shoulders, probably knees" (04/18/2018)  . Scoliosis   . Spondylosis   . Type II diabetes mellitus (HCC)    takes Metformin and Hum R daily (04/18/2018)    Objective: BP 120/72 (BP Location: Left Arm, Patient Position: Sitting, Cuff Size: Large)   Pulse (!) 109   Temp 98.6 F (37 C) (Oral)   Ht 5\' 1"  (1.549 m)   Wt  297 lb 8 oz (134.9 kg)   SpO2 96%   BMI 56.21 kg/m  General: Awake, appears stated age HEENT: MMM, EOMi Heart: RRR, no murmurs Lungs: CTAB, no rales, wheezes or rhonchi. No accessory muscle use Psych: Age appropriate judgment and insight, normal affect and mood  Assessment and Plan: Uncomplicated asthma, unspecified asthma severity, unspecified whether persistent - Plan: levocetirizine (XYZAL) 5 MG tablet  Morbid obesity (HCC)  Finish rx of Breo, has a 3 mo supply, and let know prior to refilling and we will consider ICS rather than combined LABA/ICS.  Counseled on diet and exercise. Set a goal for wt for f/u in 6 mo. Would like to lose 40 lbs.  The patient voiced understanding and agreement to the plan.  [place], DO 04/24/18  9:38 AM

## 2018-04-26 ENCOUNTER — Telehealth (HOSPITAL_COMMUNITY): Payer: Self-pay

## 2018-04-26 NOTE — Telephone Encounter (Signed)
Pt insurance is active and benefits verified through [employer]. Co-pay $0.00, DED $2,500.00/$2,500.00 met, out of pocket $7,900.00/$7,900.00 met, co-insurance 20%. No pre-authorization. Passport, 04/26/18 @ 3:45PM, BVQ#[id number]

## 2018-04-26 NOTE — Telephone Encounter (Signed)
Called patient to see if she was interested in participating in the Cardiac Rehab Program. Patient stated yes. Patient will come in for orientation on 06/11/18 @ 8:15AM and will attend the 9:45AM exercise class.  Mailed homework package.  Went over insurance, patient verbalized understanding.

## 2018-05-03 ENCOUNTER — Telehealth: Payer: Self-pay | Admitting: Cardiology

## 2018-05-03 NOTE — Telephone Encounter (Signed)
New message  The patient  was discharged from [facility] on 04/19/2018. Discharge papers state that she needs to see Dr. Mayford Knife in 1-2 weeks. Dr. Mayford Knife does not have anything available until October and the PA's don't  have anything available until end of September. Please advise.

## 2018-05-03 NOTE — Telephone Encounter (Signed)
Scheduled patient for OV 9/3 at 1100 with APP.  Left message for patient to call back to confirm/cancel appointment.

## 2018-05-06 NOTE — Telephone Encounter (Signed)
Spoke with patient and she confirmed her appointment time. She expressed understanding.

## 2018-05-10 ENCOUNTER — Encounter: Payer: Self-pay | Admitting: Cardiology

## 2018-05-14 ENCOUNTER — Ambulatory Visit: Payer: BLUE CROSS/BLUE SHIELD | Admitting: Cardiology

## 2018-05-14 ENCOUNTER — Telehealth (HOSPITAL_COMMUNITY): Payer: Self-pay

## 2018-05-14 ENCOUNTER — Encounter: Payer: Self-pay | Admitting: Cardiology

## 2018-05-14 ENCOUNTER — Telehealth (HOSPITAL_COMMUNITY): Payer: Self-pay | Admitting: Family Medicine

## 2018-05-14 VITALS — BP 142/74 | HR 98 | Ht 61.0 in | Wt 298.6 lb

## 2018-05-14 DIAGNOSIS — I2510 Atherosclerotic heart disease of native coronary artery without angina pectoris: Secondary | ICD-10-CM | POA: Diagnosis not present

## 2018-05-14 DIAGNOSIS — E782 Mixed hyperlipidemia: Secondary | ICD-10-CM | POA: Diagnosis not present

## 2018-05-14 DIAGNOSIS — Z9861 Coronary angioplasty status: Secondary | ICD-10-CM

## 2018-05-14 DIAGNOSIS — I10 Essential (primary) hypertension: Secondary | ICD-10-CM

## 2018-05-14 NOTE — Progress Notes (Signed)
05/14/2018 [name]   1961/08/17  [id number]  Primary Physician Carmelia Roller, Jilda Roche, DO Primary Cardiologist: Dr. Mayford Knife   Reason for Visit/CC: [facility] F/u for CAD s/p PCI  HPI:  [name] is a 57 y.o. female who is being seen today for post hospital f/u. She is followed by Dr. Mayford Knife. PMH is notable for ASCAD with prior cath showing normal LM, 30%, LAD, 85% mid RCA and 95% distal RCA s/p PCI of the mid to distal RCA. She also has a history of hypercholesterolemia w/ statin intolerance, hypertension, obesity and RA.  On last office visit with Dr. Mayford Knife on 03/19/2018, she was complaining of heartburnand chest pain.She was ordered a [employer] and an echocardiogram to assess for progressive CAD.   Echocardiogram revealed normal LVEF of 60% to 65%. No wall motion abnormalities. NM stress test, however was abnormal, with medium defect of moderate severity consistent with ischemia in the distal anterior and anteroseptal segments. Subsequently, she was referred for definitive cath and possible intervention. Cath was performed by Dr. Clifton James 04/19/18. She was found to have severe stenosis of the ostial RCA, 99% stenosed, treated with successful PCI + DES placement. Post intervention, there was 0% residual stenosis. The previously placed stents in the mid and distal RCA were patent with only minimal stenosis. The LAD had only mild, 20%, disease. She was monitored overnight and had no post cath complications and no recurrent CP. She was placed on DAPT w/ ASA and Plavix. Also continued on a BB and ARB. No statin due to intolerance, but it was recommended that she be referred to the lipid clinic for PCSK9 inhibitor therapy.   Today in f/u, she reports that she has done well. She denies any recurrent CP. No dyspnea. She reports full med compliance. No abnormal bleeding with DAPT. Her radial cath site is stable and free from complication. Her BP is mildly elevated today, however she  has not yet taken her morning medications. She is interested in meeting with the lipid clinic today discuss additional lipid lowering therapies.   Current Meds  Medication Sig  . albuterol (PROAIR HFA) 108 (90 Base) MCG/ACT inhaler Inhale 2 puffs into the lungs every 4 (four) hours as needed for wheezing or shortness of breath.  Marland Kitchen aspirin EC 81 MG tablet Take 81 mg by mouth daily.  . betamethasone dipropionate (DIPROLENE) 0.05 % cream Apply 1 application topically as needed.  . clopidogrel (PLAVIX) 75 MG tablet Take 1 tablet (75 mg total) by mouth daily with breakfast.  . diphenhydrAMINE (BENADRYL) 25 MG tablet Take 1 tablet (25 mg total) by mouth every 6 (six) hours as needed for itching or allergies.  Marland Kitchen enalapril (VASOTEC) 10 MG tablet Take 1 tablet (10 mg total) 2 (two) times daily by mouth.  . EPINEPHrine (EPIPEN 2-PAK) 0.3 mg/0.3 mL IJ SOAJ injection USE AS DIRECTED FOR SEVERE ALLERGIC REACTION.  . Estrogens, Conjugated (PREMARIN VA) Place 1 application vaginally 3 (three) times a week. On Saturdays, Mondays, and Wednesday (cream)  . fluticasone (FLONASE) 50 MCG/ACT nasal spray Place 2 sprays into both nostrils daily.  . fluticasone furoate-vilanterol (BREO ELLIPTA) 200-25 MCG/INH AEPB Inhale 1 puff into the lungs daily.  . furosemide (LASIX) 40 MG tablet Take 1 tablet (40 mg total) by mouth daily as needed for fluid.  Marland Kitchen gabapentin (NEURONTIN) 100 MG capsule Take 400-700 mg by mouth See admin instructions. Pt takes 400mg  in the morning, 400mg  at lunchtime and 700mg  in the evening  .  GLUCOMANNAN PO Take 700 mg by mouth 2 (two) times daily.  Marland Kitchen glucose blood (BAYER CONTOUR TEST) test strip 1 each 4 (four) times daily.   . Guaifenesin (MUCINEX MAXIMUM STRENGTH) 1200 MG TB12 Take 1,200 mg by mouth 2 (two) times daily.  . hydroxychloroquine (PLAQUENIL) 200 MG tablet Take 200 mg by mouth 2 (two) times daily.  . insulin regular human CONCENTRATED (HUMULIN R) 500 UNIT/ML injection Inject 0-100 Units  into the skin 4 (four) times daily -  before meals and at bedtime. Breakfast and lunch <100-125= 5 units 125-150= 35 u 151-200 = 45 u 201-249 = 55 u 250 - 300 = 65 u 301 - 349 = 75 u 350 - 400 = 85 u 401 - 449 = 95 u 450 - 500 = 105 u  bedtime doses <M100 - 150 = 10 u 151-200= 35 u 201 - 300 = 45u 301- 400 = 55 u 401 - 500 = 65 u  . ipratropium-albuterol (DUONEB) 0.5-2.5 (3) MG/3ML SOLN Take 3 mLs by nebulization every 4 (four) hours as needed (SOB, wheezing).  Marland Kitchen levocetirizine (XYZAL) 5 MG tablet Take 1 tablet (5 mg total) by mouth every evening.  Marland Kitchen levothyroxine (SYNTHROID, LEVOTHROID) 200 MCG tablet Take 200 mcg by mouth daily before breakfast. Except 100 mcg on Monday  . metFORMIN (GLUCOPHAGE-XR) 500 MG 24 hr tablet Take 2 tablets (1,000 mg total) by mouth 2 (two) times daily.  . metoprolol tartrate (LOPRESSOR) 25 MG tablet Take 1 tablet (25 mg total) by mouth 2 (two) times daily. Please make overdue yearly appt with Dr. Mayford Knife before anymore refills 2nd attempt  . milk thistle 175 MG tablet Take 175 mg by mouth 2 (two) times daily.   . nitroGLYCERIN (NITROSTAT) 0.4 MG SL tablet Place 1 tablet (0.4 mg total) under the tongue every 5 (five) minutes as needed for up to 25 doses for chest pain.  Marland Kitchen ondansetron (ZOFRAN) 4 MG tablet Take 4 mg by mouth every 8 (eight) hours as needed for nausea or vomiting.  . Oxycodone HCl 10 MG TABS Take 1 tablet by mouth 3 (three) times daily as needed (pain).   Marland Kitchen oxymetazoline (AFRIN) 0.05 % nasal spray Place 1 spray into both nostrils 2 (two) times daily as needed for congestion.  . pantoprazole (PROTONIX) 40 MG tablet Take 1 tablet (40 mg total) by mouth daily.  Bertram Gala Glycol-Propyl Glycol (SYSTANE ULTRA OP) Apply 1 drop to eye daily as needed (dry eyes).  . pramipexole (MIRAPEX) 0.5 MG tablet Take 0.5 mg by mouth 2 (two) times daily.  . promethazine (PHENERGAN) 25 MG tablet Take 1 tablet (25 mg total) by mouth every 8 (eight) hours as needed for  nausea or vomiting.  Marland Kitchen Propylene Glycol (SYSTANE COMPLETE OP) Apply 1 drop to eye as needed (dry eyes).  . pyridoxine (B-6) 100 MG tablet Take 100 mg by mouth 2 (two) times daily.  Marland Kitchen saccharomyces boulardii (FLORASTOR) 250 MG capsule Take 250 mg by mouth 3 (three) times daily.  . vitamin A 16109 UNIT capsule Take 10,000 Units by mouth daily.  . vitamin B-12 (CYANOCOBALAMIN) 1000 MCG tablet Take 1,000 mcg by mouth daily.    . Vitamin D, Ergocalciferol, (DRISDOL) 50000 units CAPS capsule 50,000 Units every 7 (seven) days. Take on Friday  . Vitamins-Lipotropics (LIPO-FLAVONOID PLUS PO) Take 1 tablet by mouth 3 (three) times daily.   Allergies  Allergen Reactions  . Fish Allergy Anaphylaxis    INCLUDES OMEGA 3 OILS  . Fish Oil  Anaphylaxis and Swelling    THROAT SWELLS INCLUDES FISH AS A CLASS  . Omega-3 Fatty Acids Swelling    Throat swelling  Throat swelling   . Other Anaphylaxis, Other (See Comments), Nausea And Vomiting and Shortness Of Breath    UNSPECIFIED REACTION  Perch Other reaction(s): Other (See Comments) INCLUDES OMEGA 3 OILS Perch UNSPECIFIED REACTION  Perch  . Crestor [Rosuvastatin] Other (See Comments)    Extreme joint pain, to this & other statins  . Gabapentin Anxiety    Anxiety on high doses  . Lovastatin Other (See Comments)    EXTREME JOINT PAIN  . Metronidazole Nausea Only and Swelling    Nausea and vomiting  Other reaction(s): Other (See Comments) Headache and shakes Nausea and vomiting  . Monascus Purpureus Went Yeast Other (See Comments)    Muscle pain and severe joint pain.   . Red Yeast Rice [Cholestin] Other (See Comments)    Muscle pain and severe joint pain.   . Rotigotine Swelling    Extreme edema   . Atrovent Hfa [Ipratropium Bromide Hfa] Other (See Comments)    wheezing  . Suprep [Na Sulfate-K Sulfate-Mg Sulf] Nausea And Vomiting  . Tape Other (See Comments)    Electrodes causes skin breakdown   Past Medical History:  Diagnosis Date  .  Asthma    "on daily RX and rescue inhaler" (04/18/2018)  . Chronic diastolic CHF (congestive heart failure) (HCC) 09/2015  . Chronic lower back pain   . Chronic neck pain   . Chronic pain syndrome    Fentanyl Patch  . Colon polyps   . Coronary artery disease    cath with normal LM, 30% LAD, 85% mid RCA and 95% distal RCA s/p PCI of the mid to distal RCA and now on DAPT with ASA and Ticagrelor.    . Eczema   . Excessive daytime sleepiness 11/26/2015  . Fibromyalgia   . Gallstones   . GERD (gastroesophageal reflux disease)   . Heart murmur    "noted for the 1st time on 04/18/2018"  . History of blood transfusion 07/2010   "S/P oophorectomy"  . History of gout   . History of hiatal hernia 1980s   "gone now" (04/18/2018)  . Hyperlipidemia   . Hypertension    takes Metoprolol and Enalapril daily  . Hypothyroidism    takes Synthroid daily  . IBS (irritable bowel syndrome)   . Migraine    "nothing in the 2000s" (04/18/2018)  . Mixed connective tissue disease (HCC)   . NAFLD (nonalcoholic fatty liver disease)   . Pneumonia    "several times" (04/18/2018)  . Rheumatoid arthritis (HCC)    "hands, elbows, shoulders, probably knees" (04/18/2018)  . Scoliosis   . Spondylosis   . Type II diabetes mellitus (HCC)    takes Metformin and Hum R daily (04/18/2018)   Family History  Problem Relation Age of Onset  . CAD Mother   . Hypertension Mother   . Heart attack Mother   . CAD Father   . Heart attack Father   . Allergic rhinitis Father   . Asthma Father   . Hypertension Brother   . Hypertension Brother   . Pancreatic cancer Paternal Aunt   . Breast cancer Paternal Aunt   . Lung cancer Paternal Aunt    Past Surgical History:  Procedure Laterality Date  . ABDOMINAL HYSTERECTOMY  06/2005   "w/right ovariy"  . APPENDECTOMY    . BREAST BIOPSY Bilateral    9 total (  04/18/2018)  . CORONARY ANGIOPLASTY WITH STENT PLACEMENT  10/01/2015   normal LM, 30% LAD, 85% mid RCA and 95% distal RCA s/p PCI  of the mid to distal RCA and now on DAPT with ASA and Ticagrelor.    . CORONARY STENT INTERVENTION Right 04/18/2018   Procedure: CORONARY STENT INTERVENTION;  Surgeon: [name], MD;  Location: [facility];  Service: Cardiovascular;  Laterality: Right;  . DILATION AND CURETTAGE OF UTERUS    . FRACTURE SURGERY    . LAPAROSCOPIC CHOLECYSTECTOMY    . LEFT HEART CATH AND CORONARY ANGIOGRAPHY N/A 04/18/2018   Procedure: LEFT HEART CATH AND CORONARY ANGIOGRAPHY;  Surgeon: [name], MD;  Location: [facility];  Service: Cardiovascular;  Laterality: N/A;  . MUSCLE BIOPSY Left    "leg"  . OOPHORECTOMY  07/2010  . RADIOLOGY WITH ANESTHESIA N/A 05/25/2016   Procedure: RADIOLOGY WITH ANESTHESIA;  Surgeon: Medication Radiologist, MD;  Location: [facility] OR;  Service: Radiology;  Laterality: N/A;  . TONSILLECTOMY AND ADENOIDECTOMY    . WRIST FRACTURE SURGERY Left    "crushed it"   Social History   Socioeconomic History  . Marital status: Married    Spouse name: Not on file  . Number of children: 1  . Years of education: Not on file  . Highest education level: Not on file  Occupational History  . Not on file  Social Needs  . Financial resource strain: Not on file  . Food insecurity:    Worry: Not on file    Inability: Not on file  . Transportation needs:    Medical: Not on file    Non-medical: Not on file  Tobacco Use  . Smoking status: Former Smoker    Packs/day: 1.00    Years: 20.00    Pack years: 20.00    Types: Cigarettes    Last attempt to quit: 02/11/2004    Years since quitting: 14.2  . Smokeless tobacco: Never Used  Substance and Sexual Activity  . Alcohol use: Yes    Comment: 04/18/2018 "couple drinks/year"  . Drug use: Not Currently    Types: Marijuana    Comment: "only in my teens"  . Sexual activity: Yes    Birth control/protection: Surgical  Lifestyle  . Physical activity:    Days per week: Not on file    Minutes per session: Not on  file  . Stress: Not on file  Relationships  . Social connections:    Talks on phone: Not on file    Gets together: Not on file    Attends religious service: Not on file    Active member of club or organization: Not on file    Attends meetings of clubs or organizations: Not on file    Relationship status: Not on file  . Intimate partner violence:    Fear of current or ex partner: Not on file    Emotionally abused: Not on file    Physically abused: Not on file    Forced sexual activity: Not on file  Other Topics Concern  . Not on file  Social History Narrative  . Not on file     Review of Systems: General: negative for chills, fever, night sweats or weight changes.  Cardiovascular: negative for chest pain, dyspnea on exertion, edema, orthopnea, palpitations, paroxysmal nocturnal dyspnea or shortness of breath Dermatological: negative for rash Respiratory: negative for cough or wheezing Urologic: negative for hematuria Abdominal: negative for nausea, vomiting, diarrhea, bright red  blood per rectum, melena, or hematemesis Neurologic: negative for visual changes, syncope, or dizziness All other systems reviewed and are otherwise negative except as noted above.   Physical Exam:  Blood pressure (!) 142/74, pulse 98, height 5\' 1"  (1.549 m), weight 298 lb 9.6 oz (135.4 kg).  General appearance: alert, cooperative and no distress, morbidly obese Neck: no carotid bruit and no JVD Lungs: clear to auscultation bilaterally Heart: regular rate and rhythm, S1, S2 normal, 2/6 murmur at RUSB Extremities: extremities normal, atraumatic, no cyanosis or edema Pulses: 2+ and symmetric Skin: Skin color, texture, turgor normal. No rashes or lesions Neurologic: Grossly normal  EKG NSR, no ischemia -- personally reviewed   ASSESSMENT AND PLAN:   1. CAD: recent cath 04/2018 showed severe stenosis of the ostial RCA, 99% stenosed, treated with successful PCI + DES placement. Post intervention, there  was 0% residual stenosis. The previously placed stents in the mid and distal RCA were patent with only minimal stenosis. The LAD had only mild, 20%, disease. She is doing well post PCI. No recurrent CP or dyspnea. Continue DAPT w/ ASA and Plavix for a minimum of 1 year, + BB and ACEi. Referral placed for PCSK9i due to statin intolerance.   2. HLD w/ statin intolerance: pt has failed multiple statins due to side effects. Recent lipid panel showed elevated LDL at 146 mg/dL. Goal is <70 mg/dL. She is interested in meeting with the lipid clinic to discuss additional lipid lowering therapies. Referral has been placed for consideration for PCSK9 inhibitor therapy. Appt pending.   3. HTN: mildly elevated at 142/74 however pt has not yet takem her morning meds. She is due to taker her BB and ACEi. I suspect her BP will improve once she takes her meds. Monitor at home. Continue current regimen.    4. Obesity: we discussed weight loss and exercise. She is considering cardiac rehab and also would like to try water aerobics. Pt encouraged to increase physical activity.   5. RA: followed by Rheumatology.   Follow-Up w/ Dr. 05/2018 in 3 Months.   Kahleel Fadeley Mayford Knife, MHS [facility] 05/14/2018 12:57 PM

## 2018-05-14 NOTE — Telephone Encounter (Signed)
Received telephone message from Liborio Nixon stating patient called to cancel Cardiac Rehab as she has an auto immune flare right now and does not want to participate at this time until she regains her stamina back. Closed referral.

## 2018-05-14 NOTE — Patient Instructions (Addendum)
Medication Instructions:  Your physician recommends that you continue on your current medications as directed. Please refer to the Current Medication list given to you today.   Labwork: None ordered  Testing/Procedures: None ordered  Follow-Up: Your physician wants you to follow-up in: 4 MONTHS with DR. Mayford Knife.   KEEP YOUR APPOINTMENT WITH THE LIPID CLINIC AT OUR [facility] ON  9/17 @ 11 AM. PLEASE MAKE SURE TO ARRIVE 15 MINUTES PRIOR TO YOUR APPOINTMENT.  Any Other Special Instructions Will Be Listed Below (If Applicable).  IF YOU SEE AN INCREASE IN YOUR WEIGHT OF 3 POUNDS IN A DAY OR 5 POUNDS IN A WEEK, YOU MAY TAKE AN EXTRA LASIX.     If you need a refill on your cardiac medications before your next appointment, please call your pharmacy.

## 2018-05-28 ENCOUNTER — Ambulatory Visit: Payer: BLUE CROSS/BLUE SHIELD

## 2018-06-03 ENCOUNTER — Other Ambulatory Visit: Payer: Self-pay | Admitting: Family Medicine

## 2018-06-03 DIAGNOSIS — J45909 Unspecified asthma, uncomplicated: Secondary | ICD-10-CM

## 2018-06-04 ENCOUNTER — Other Ambulatory Visit: Payer: Self-pay | Admitting: Family Medicine

## 2018-06-04 DIAGNOSIS — J45909 Unspecified asthma, uncomplicated: Secondary | ICD-10-CM

## 2018-06-04 MED ORDER — FLUTICASONE FUROATE-VILANTEROL 200-25 MCG/INH IN AEPB: 1.0000 | INHALATION_SPRAY | Freq: Every day | RESPIRATORY_TRACT | 2 refills | Status: DC

## 2018-06-04 NOTE — Telephone Encounter (Signed)
Copied from CRM [id number]. Topic: Quick Communication - Rx Refill/Question >> Jun 04, 2018 10:43 AM Crist Infante wrote: Medication: Earlie Server 200-25 MCG/INH AEPB   Pharmacy states insurance will not cover the 28 quantity.  needs to be 60 for insurance to approve [id number] - [place], [place] - [address]

## 2018-06-04 NOTE — Telephone Encounter (Signed)
Rx re-sent for 60 day pack.

## 2018-06-11 ENCOUNTER — Ambulatory Visit (HOSPITAL_COMMUNITY): Payer: BLUE CROSS/BLUE SHIELD

## 2018-06-13 ENCOUNTER — Other Ambulatory Visit: Payer: Self-pay | Admitting: Family Medicine

## 2018-06-17 ENCOUNTER — Ambulatory Visit (HOSPITAL_COMMUNITY): Payer: BLUE CROSS/BLUE SHIELD

## 2018-06-19 ENCOUNTER — Ambulatory Visit (HOSPITAL_COMMUNITY): Payer: BLUE CROSS/BLUE SHIELD

## 2018-06-21 ENCOUNTER — Ambulatory Visit (HOSPITAL_COMMUNITY): Payer: BLUE CROSS/BLUE SHIELD

## 2018-06-24 ENCOUNTER — Ambulatory Visit (HOSPITAL_COMMUNITY): Payer: BLUE CROSS/BLUE SHIELD

## 2018-06-25 ENCOUNTER — Other Ambulatory Visit: Payer: Self-pay | Admitting: Family Medicine

## 2018-06-25 DIAGNOSIS — J4541 Moderate persistent asthma with (acute) exacerbation: Secondary | ICD-10-CM

## 2018-06-26 ENCOUNTER — Ambulatory Visit (HOSPITAL_COMMUNITY): Payer: BLUE CROSS/BLUE SHIELD

## 2018-06-26 NOTE — Telephone Encounter (Signed)
This isn't a chronic med, is she having issues?

## 2018-06-26 NOTE — Telephone Encounter (Signed)
Patient has gotten this refilled by her neurologist.

## 2018-06-28 ENCOUNTER — Ambulatory Visit (HOSPITAL_COMMUNITY): Payer: BLUE CROSS/BLUE SHIELD

## 2018-07-01 ENCOUNTER — Ambulatory Visit (HOSPITAL_COMMUNITY): Payer: BLUE CROSS/BLUE SHIELD

## 2018-07-03 ENCOUNTER — Ambulatory Visit (HOSPITAL_COMMUNITY): Payer: BLUE CROSS/BLUE SHIELD

## 2018-07-05 ENCOUNTER — Ambulatory Visit (HOSPITAL_COMMUNITY): Payer: BLUE CROSS/BLUE SHIELD

## 2018-07-08 ENCOUNTER — Ambulatory Visit (HOSPITAL_COMMUNITY): Payer: BLUE CROSS/BLUE SHIELD

## 2018-07-10 ENCOUNTER — Ambulatory Visit (HOSPITAL_COMMUNITY): Payer: BLUE CROSS/BLUE SHIELD

## 2018-07-11 ENCOUNTER — Other Ambulatory Visit: Payer: Self-pay | Admitting: Family Medicine

## 2018-07-12 ENCOUNTER — Ambulatory Visit (HOSPITAL_COMMUNITY): Payer: BLUE CROSS/BLUE SHIELD

## 2018-07-15 ENCOUNTER — Ambulatory Visit (HOSPITAL_COMMUNITY): Payer: BLUE CROSS/BLUE SHIELD

## 2018-07-17 ENCOUNTER — Ambulatory Visit (HOSPITAL_COMMUNITY): Payer: BLUE CROSS/BLUE SHIELD

## 2018-07-19 ENCOUNTER — Other Ambulatory Visit: Payer: Self-pay | Admitting: Family Medicine

## 2018-07-19 ENCOUNTER — Ambulatory Visit (HOSPITAL_COMMUNITY): Payer: BLUE CROSS/BLUE SHIELD

## 2018-07-22 ENCOUNTER — Ambulatory Visit (HOSPITAL_COMMUNITY): Payer: BLUE CROSS/BLUE SHIELD

## 2018-07-24 ENCOUNTER — Ambulatory Visit (HOSPITAL_COMMUNITY): Payer: BLUE CROSS/BLUE SHIELD

## 2018-07-26 ENCOUNTER — Ambulatory Visit (HOSPITAL_COMMUNITY): Payer: BLUE CROSS/BLUE SHIELD

## 2018-07-29 ENCOUNTER — Ambulatory Visit (HOSPITAL_COMMUNITY): Payer: BLUE CROSS/BLUE SHIELD

## 2018-07-31 ENCOUNTER — Ambulatory Visit (HOSPITAL_COMMUNITY): Payer: BLUE CROSS/BLUE SHIELD

## 2018-08-02 ENCOUNTER — Ambulatory Visit (HOSPITAL_COMMUNITY): Payer: BLUE CROSS/BLUE SHIELD

## 2018-08-05 ENCOUNTER — Ambulatory Visit (HOSPITAL_COMMUNITY): Payer: BLUE CROSS/BLUE SHIELD

## 2018-08-07 ENCOUNTER — Ambulatory Visit (HOSPITAL_COMMUNITY): Payer: BLUE CROSS/BLUE SHIELD

## 2018-08-09 ENCOUNTER — Other Ambulatory Visit: Payer: Self-pay | Admitting: Family Medicine

## 2018-08-09 ENCOUNTER — Ambulatory Visit (HOSPITAL_COMMUNITY): Payer: BLUE CROSS/BLUE SHIELD

## 2018-08-09 ENCOUNTER — Other Ambulatory Visit: Payer: Self-pay | Admitting: Cardiology

## 2018-08-09 DIAGNOSIS — J45909 Unspecified asthma, uncomplicated: Secondary | ICD-10-CM

## 2018-08-12 ENCOUNTER — Ambulatory Visit (HOSPITAL_COMMUNITY): Payer: BLUE CROSS/BLUE SHIELD

## 2018-08-13 ENCOUNTER — Other Ambulatory Visit: Payer: Self-pay | Admitting: Family Medicine

## 2018-08-13 DIAGNOSIS — J45909 Unspecified asthma, uncomplicated: Secondary | ICD-10-CM

## 2018-08-13 DIAGNOSIS — E559 Vitamin D deficiency, unspecified: Secondary | ICD-10-CM

## 2018-08-14 ENCOUNTER — Ambulatory Visit: Payer: Self-pay | Admitting: *Deleted

## 2018-08-14 ENCOUNTER — Ambulatory Visit (INDEPENDENT_AMBULATORY_CARE_PROVIDER_SITE_OTHER): Payer: BLUE CROSS/BLUE SHIELD | Admitting: Nurse Practitioner

## 2018-08-14 ENCOUNTER — Encounter: Payer: Self-pay | Admitting: Nurse Practitioner

## 2018-08-14 ENCOUNTER — Ambulatory Visit (HOSPITAL_COMMUNITY): Payer: BLUE CROSS/BLUE SHIELD

## 2018-08-14 VITALS — BP 122/70 | HR 96 | Temp 97.7°F | Ht 61.0 in | Wt 311.6 lb

## 2018-08-14 DIAGNOSIS — J0140 Acute pansinusitis, unspecified: Secondary | ICD-10-CM

## 2018-08-14 DIAGNOSIS — J4541 Moderate persistent asthma with (acute) exacerbation: Secondary | ICD-10-CM

## 2018-08-14 MED ORDER — LEVOFLOXACIN 500 MG PO TABS: 500.0000 mg | ORAL_TABLET | Freq: Every day | ORAL | 0 refills | Status: AC

## 2018-08-14 MED ORDER — DM-GUAIFENESIN ER 30-600 MG PO TB12: 1.0000 | ORAL_TABLET | Freq: Two times a day (BID) | ORAL | 0 refills | Status: DC | PRN

## 2018-08-14 MED ORDER — METHYLPREDNISOLONE ACETATE 40 MG/ML IJ SUSP
40.0000 mg | Freq: Once | INTRAMUSCULAR | Status: AC
  Administered 2018-08-14: 40 mg via INTRAMUSCULAR

## 2018-08-14 MED ORDER — BENZONATATE 100 MG PO CAPS: 100.0000 mg | ORAL_CAPSULE | Freq: Three times a day (TID) | ORAL | 0 refills | Status: DC | PRN

## 2018-08-14 MED ORDER — METHYLPREDNISOLONE 4 MG PO TBPK: ORAL_TABLET | ORAL | 0 refills | Status: DC

## 2018-08-14 NOTE — Progress Notes (Signed)
Subjective:  Patient ID: [name], female    DOB: Sep 06, 1961  Age: 57 y.o. MRN: [id number]  CC: Cough (pt is c/o of coughing yellow mucus,congestions,runny nose,SOB,asthma. tried inhaler and breathing treatment. going 1 wk. )  Cough  This is a new problem. The current episode started in the past 7 days. The problem has been gradually worsening. The cough is productive of purulent sputum. Associated symptoms include chills, ear congestion, ear pain, nasal congestion, postnasal drip, rhinorrhea, a sore throat, shortness of breath and wheezing. The symptoms are aggravated by lying down. She has tried a beta-agonist inhaler, OTC cough suppressant and steroid inhaler for the symptoms. The treatment provided mild relief. Her past medical history is significant for asthma, bronchitis and environmental allergies.   Use of duoneb nebulizer every 4hrs with minimal relief. Home glucose average in last 2weeks: 158. 1episode of glucose reading at 250.  Reviewed past Medical, Social and Family history today.  Outpatient Medications Prior to Visit  Medication Sig Dispense Refill  . aspirin EC 81 MG tablet Take 81 mg by mouth daily.    Marland Kitchen augmented betamethasone dipropionate (DIPROLENE-AF) 0.05 % cream APPLY 1 CREAM TOPICALLY ONCE DAILY AS NEEDED FOR  ECZEMA 15 g 3  . betamethasone dipropionate (DIPROLENE) 0.05 % cream Apply 1 application topically as needed.    Marland Kitchen BREO ELLIPTA 200-25 MCG/INH AEPB INHALE 1 PUFF BY MOUTH INTO THE LUNGS ONCE DAILY 60 each 2  . clopidogrel (PLAVIX) 75 MG tablet Take 1 tablet (75 mg total) by mouth daily with breakfast. 30 tablet 11  . diphenhydrAMINE (BENADRYL) 25 MG tablet Take 1 tablet (25 mg total) by mouth every 6 (six) hours as needed for itching or allergies. 10 tablet 0  . enalapril (VASOTEC) 10 MG tablet Take 1 tablet (10 mg total) 2 (two) times daily by mouth. 180 tablet 3  . EPINEPHrine (EPIPEN 2-PAK) 0.3 mg/0.3 mL IJ SOAJ injection USE AS DIRECTED FOR SEVERE  ALLERGIC REACTION. 2 Device 3  . Estrogens, Conjugated (PREMARIN VA) Place 1 application vaginally 3 (three) times a week. On Saturdays, Mondays, and Wednesday (cream)    . fluticasone (FLONASE) 50 MCG/ACT nasal spray Place 2 sprays into both nostrils daily.    . furosemide (LASIX) 40 MG tablet Take 1 tablet (40 mg total) by mouth daily as needed for fluid. 30 tablet 0  . gabapentin (NEURONTIN) 100 MG capsule Take 400-700 mg by mouth See admin instructions. Pt takes 400mg  in the morning, 400mg  at lunchtime and 700mg  in the evening    . GLUCOMANNAN PO Take 700 mg by mouth 2 (two) times daily.    Marland Kitchen glucose blood (BAYER CONTOUR TEST) test strip 1 each 4 (four) times daily.     . Guaifenesin (MUCINEX MAXIMUM STRENGTH) 1200 MG TB12 Take 1,200 mg by mouth 2 (two) times daily.    . hydroxychloroquine (PLAQUENIL) 200 MG tablet Take 200 mg by mouth 2 (two) times daily.    . insulin regular human CONCENTRATED (HUMULIN R) 500 UNIT/ML injection Inject 0-100 Units into the skin 4 (four) times daily -  before meals and at bedtime. Breakfast and lunch <100-125= 5 units 125-150= 35 u 151-200 = 45 u 201-249 = 55 u 250 - 300 = 65 u 301 - 349 = 75 u 350 - 400 = 85 u 401 - 449 = 95 u 450 - 500 = 105 u  bedtime doses <M100 - 150 = 10 u 151-200= 35 u 201 - 300 = 45u 301- 400 =  55 u 401 - 500 = 65 u    . ipratropium-albuterol (DUONEB) 0.5-2.5 (3) MG/3ML SOLN USE 3 ML IN NEBULIZER  EVERY 4 HOURS AS NEEDED 360 mL 1  . levocetirizine (XYZAL) 5 MG tablet TAKE 1 TABLET BY MOUTH EVERY EVENING 90 tablet 1  . levothyroxine (SYNTHROID, LEVOTHROID) 200 MCG tablet Take 200 mcg by mouth daily before breakfast. Except 100 mcg on Monday    . metFORMIN (GLUCOPHAGE-XR) 500 MG 24 hr tablet Take 2 tablets (1,000 mg total) by mouth 2 (two) times daily.  0  . metoprolol tartrate (LOPRESSOR) 25 MG tablet Take 1 tablet (25 mg total) by mouth 2 (two) times daily. Please make overdue yearly appt with Dr. Mayford Knife before anymore refills  2nd attempt 180 tablet 3  . milk thistle 175 MG tablet Take 175 mg by mouth 2 (two) times daily.     . nitroGLYCERIN (NITROSTAT) 0.4 MG SL tablet Place 1 tablet (0.4 mg total) under the tongue every 5 (five) minutes as needed for up to 25 doses for chest pain. 25 tablet 2  . ondansetron (ZOFRAN) 4 MG tablet Take 4 mg by mouth every 8 (eight) hours as needed for nausea or vomiting.    . Oxycodone HCl 10 MG TABS Take 1 tablet by mouth 3 (three) times daily as needed (pain).   0  . oxymetazoline (AFRIN) 0.05 % nasal spray Place 1 spray into both nostrils 2 (two) times daily as needed for congestion.    . pantoprazole (PROTONIX) 40 MG tablet TAKE 1 TABLET BY MOUTH ONCE DAILY 90 tablet 1  . Polyethyl Glycol-Propyl Glycol (SYSTANE ULTRA OP) Apply 1 drop to eye daily as needed (dry eyes).    . pramipexole (MIRAPEX) 0.5 MG tablet Take 0.5 mg by mouth 2 (two) times daily.    Marland Kitchen PROAIR HFA 108 (90 Base) MCG/ACT inhaler INHALE 2 PUFFS BY MOUTH EVERY 4 HOURS AS NEEDED FOR WHEEZING AND FOR SHORTNESS OF BREATH 18 each 5  . promethazine (PHENERGAN) 25 MG tablet TAKE 1 TABLET BY MOUTH EVERY 8 HOURS AS NEEDED FOR NAUSEA AND FOR VOMITING 30 tablet 0  . Propylene Glycol (SYSTANE COMPLETE OP) Apply 1 drop to eye as needed (dry eyes).    . pyridoxine (B-6) 100 MG tablet Take 100 mg by mouth 2 (two) times daily.    Marland Kitchen saccharomyces boulardii (FLORASTOR) 250 MG capsule Take 250 mg by mouth 3 (three) times daily.    . vitamin A 95621 UNIT capsule Take 10,000 Units by mouth daily.    . vitamin B-12 (CYANOCOBALAMIN) 1000 MCG tablet Take 1,000 mcg by mouth daily.      . Vitamin D, Ergocalciferol, (DRISDOL) 50000 units CAPS capsule 50,000 Units every 7 (seven) days. Take on Friday 12 capsule 9  . Vitamins-Lipotropics (LIPO-FLAVONOID PLUS PO) Take 1 tablet by mouth 3 (three) times daily.    . fluticasone (FLONASE) 50 MCG/ACT nasal spray USE 2 SPRAY(S) IN EACH NOSTRIL ONCE DAILY AS NEEDED FOR ALLERGIES OR  RHINITIS (Patient not  taking: Reported on 08/14/2018) 16 g 3  . Vitamin D, Ergocalciferol, (DRISDOL) 1.25 MG (50000 UT) CAPS capsule TAKE 1 CAPSULE BY MOUTH ONCE A WEEK ON MONDAY (Patient not taking: Reported on 08/14/2018) 12 capsule 9   No facility-administered medications prior to visit.     ROS See HPI  Objective:  BP 122/70   Pulse 96   Temp 97.7 F (36.5 C) (Oral)   Ht 5\' 1"  (1.549 m)   Wt (!) 311 lb  9.6 oz (141.3 kg)   SpO2 95%   BMI 58.88 kg/m   BP Readings from Last 3 Encounters:  08/14/18 122/70  05/14/18 (!) 142/74  04/24/18 120/72    Wt Readings from Last 3 Encounters:  08/14/18 (!) 311 lb 9.6 oz (141.3 kg)  05/14/18 298 lb 9.6 oz (135.4 kg)  04/24/18 297 lb 8 oz (134.9 kg)   Peak Flow Meter Measurement: 270, 265, 2102ml/min.  Physical Exam  Constitutional: She is oriented to person, place, and time.  HENT:  Nose: Mucosal edema and rhinorrhea present. Right sinus exhibits maxillary sinus tenderness and frontal sinus tenderness. Left sinus exhibits maxillary sinus tenderness and frontal sinus tenderness.  Mouth/Throat: Uvula is midline. Posterior oropharyngeal erythema present.  Cardiovascular: Normal rate and regular rhythm.  Pulmonary/Chest: Effort normal. She has wheezes.  Diminished lung sounds at bases, could be due to body habitus  Musculoskeletal: She exhibits edema.  Neurological: She is alert and oriented to person, place, and time.  Vitals reviewed.   Lab Results  Component Value Date   WBC 6.6 04/19/2018   HGB 12.3 04/19/2018   HCT 40.1 04/19/2018   PLT 184 04/19/2018   GLUCOSE 234 (H) 04/19/2018   CHOL 217 (H) 04/08/2018   TRIG 116 04/08/2018   HDL 48 04/08/2018   LDLCALC 146 (H) 04/08/2018   ALT 46 (H) 04/08/2018   AST 30 04/08/2018   NA 140 04/19/2018   K 4.1 04/19/2018   CL 102 04/19/2018   CREATININE 0.94 04/19/2018   BUN 10 04/19/2018   CO2 26 04/19/2018   TSH 1.388 01/02/2018   INR 1.21 03/09/2016   HGBA1C 8.4 (H) 01/02/2018    No results  found.  Assessment & Plan:   Yanaira was seen today for cough.  Diagnoses and all orders for this visit:  Moderate persistent asthma with acute exacerbation -     Peak flow meter -     methylPREDNISolone acetate (DEPO-MEDROL) injection 40 mg -     methylPREDNISolone (MEDROL DOSEPAK) 4 MG TBPK tablet; Take as directed on package -     levofloxacin (LEVAQUIN) 500 MG tablet; Take 1 tablet (500 mg total) by mouth daily for 5 days. -     dextromethorphan-guaiFENesin (MUCINEX DM) 30-600 MG 12hr tablet; Take 1 tablet by mouth 2 (two) times daily as needed for cough. -     benzonatate (TESSALON) 100 MG capsule; Take 1 capsule (100 mg total) by mouth 3 (three) times daily as needed for cough.  Acute non-recurrent pansinusitis -     Peak flow meter -     methylPREDNISolone acetate (DEPO-MEDROL) injection 40 mg -     methylPREDNISolone (MEDROL DOSEPAK) 4 MG TBPK tablet; Take as directed on package -     levofloxacin (LEVAQUIN) 500 MG tablet; Take 1 tablet (500 mg total) by mouth daily for 5 days. -     dextromethorphan-guaiFENesin (MUCINEX DM) 30-600 MG 12hr tablet; Take 1 tablet by mouth 2 (two) times daily as needed for cough. -     benzonatate (TESSALON) 100 MG capsule; Take 1 capsule (100 mg total) by mouth 3 (three) times daily as needed for cough.   I am having Mariangela M. Bellotti start on methylPREDNISolone, levofloxacin, dextromethorphan-guaiFENesin, and benzonatate. I am also having her maintain her vitamin B-12, aspirin EC, (Estrogens, Conjugated (PREMARIN VA)), gabapentin, Guaifenesin, HUMULIN R, levothyroxine, diphenhydrAMINE, hydroxychloroquine, vitamin A, GLUCOMANNAN PO, oxymetazoline, Polyethyl Glycol-Propyl Glycol (SYSTANE ULTRA OP), milk thistle, Vitamins-Lipotropics (LIPO-FLAVONOID PLUS PO), EPINEPHrine, glucose blood, enalapril, Vitamin D (  Ergocalciferol), pyridoxine, furosemide, Oxycodone HCl, metoprolol tartrate, ondansetron, pramipexole, Propylene Glycol (SYSTANE COMPLETE OP),  nitroGLYCERIN, metFORMIN, clopidogrel, betamethasone dipropionate, fluticasone, saccharomyces boulardii, augmented betamethasone dipropionate, fluticasone, ipratropium-albuterol, promethazine, levocetirizine, PROAIR HFA, pantoprazole, Vitamin D (Ergocalciferol), and BREO ELLIPTA. We will continue to administer methylPREDNISolone acetate.  Meds ordered this encounter  Medications  . methylPREDNISolone acetate (DEPO-MEDROL) injection 40 mg  . methylPREDNISolone (MEDROL DOSEPAK) 4 MG TBPK tablet    Sig: Take as directed on package    Dispense:  21 tablet    Refill:  0    Order Specific Question:   Supervising Provider    Answer:   MATTHEWS, CODY [[id number]]  . levofloxacin (LEVAQUIN) 500 MG tablet    Sig: Take 1 tablet (500 mg total) by mouth daily for 5 days.    Dispense:  5 tablet    Refill:  0    Order Specific Question:   Supervising Provider    Answer:   MATTHEWS, CODY [[id number]]  . dextromethorphan-guaiFENesin (MUCINEX DM) 30-600 MG 12hr tablet    Sig: Take 1 tablet by mouth 2 (two) times daily as needed for cough.    Dispense:  14 tablet    Refill:  0    Order Specific Question:   Supervising Provider    Answer:   MATTHEWS, CODY [[id number]]  . benzonatate (TESSALON) 100 MG capsule    Sig: Take 1 capsule (100 mg total) by mouth 3 (three) times daily as needed for cough.    Dispense:  20 capsule    Refill:  0    Order Specific Question:   Supervising Provider    Answer:   MATTHEWS, CODY [[id number]]    Problem List Items Addressed This Visit      Respiratory   Moderate persistent asthma - Primary   Relevant Medications   methylPREDNISolone acetate (DEPO-MEDROL) injection 40 mg   methylPREDNISolone (MEDROL DOSEPAK) 4 MG TBPK tablet (Start on 08/15/2018)   levofloxacin (LEVAQUIN) 500 MG tablet   dextromethorphan-guaiFENesin (MUCINEX DM) 30-600 MG 12hr tablet   benzonatate (TESSALON) 100 MG capsule   Other Relevant Orders   Peak flow meter    Other Visit Diagnoses    Acute non-recurrent  pansinusitis       Relevant Medications   methylPREDNISolone acetate (DEPO-MEDROL) injection 40 mg   methylPREDNISolone (MEDROL DOSEPAK) 4 MG TBPK tablet (Start on 08/15/2018)   levofloxacin (LEVAQUIN) 500 MG tablet   dextromethorphan-guaiFENesin (MUCINEX DM) 30-600 MG 12hr tablet   benzonatate (TESSALON) 100 MG capsule   Other Relevant Orders   Peak flow meter       Follow-up: No follow-ups on file.  Alysia Penna, NP

## 2018-08-14 NOTE — Telephone Encounter (Signed)
Pt reports productive cough x 1 week, worsening. Reports sinus tenderness and LGT, last checked Monday, 99.5. States cough productive for dark yellow phlegm, nasal drainage "yellowish." States husband had similar symptoms last week. H/O asthma. Reports intermittent wheezing; using inhalers q4hrs No availability with any provider at [facility].  Appt made for today with C. Nche. Care advise given per protocol, verbalizes understanding. Reason for Disposition . [1] Continuous (nonstop) coughing interferes with work or school AND [2] no improvement using cough treatment per Care Advice    LGT,sinus tenderness, h/o asthma  Answer Assessment - Initial Assessment Questions 1. ONSET: "When did the cough begin?"      1 week ago 2. SEVERITY: "How bad is the cough today?"      Moderate, severe at night 3. RESPIRATORY DISTRESS: "Describe your breathing."      Wheezing at times, using inhalers 4. FEVER: "Do you have a fever?" If so, ask: "What is your temperature, how was it measured, and when did it start?"     99.5  Monday when last checked 5. SPUTUM: "Describe the color of your sputum" (clear, white, yellow, green)     Yellowish 6. HEMOPTYSIS: "Are you coughing up any blood?" If so ask: "How much?" (flecks, streaks, tablespoons, etc.)    no 7. CARDIAC HISTORY: "Do you have any history of heart disease?" (e.g., heart attack, congestive heart failure)      Chronic diastolic CHF 8. LUNG HISTORY: "Do you have any history of lung disease?"  (e.g., pulmonary embolus, asthma, emphysema)     asthma 9. PE RISK FACTORS: "Do you have a history of blood clots?" (or: recent major surgery, recent prolonged travel, bedridden)    no 10. OTHER SYMPTOMS: "Do you have any other symptoms?" (e.g., runny nose, wheezing, chest pain)       Wheezing at times, runny nose, sinus tenderness  Protocols used: COUGH - ACUTE PRODUCTIVE-A-AH

## 2018-08-14 NOTE — Patient Instructions (Addendum)
Start oral prednisone tomorrow  Call office if you do not start to feel better by Friday.  Use duoneb nebulizer at least every 6-8hrs for coughing and wheezing.

## 2018-08-15 ENCOUNTER — Encounter: Payer: Self-pay | Admitting: Nurse Practitioner

## 2018-08-16 ENCOUNTER — Ambulatory Visit (HOSPITAL_COMMUNITY): Payer: BLUE CROSS/BLUE SHIELD

## 2018-08-19 ENCOUNTER — Ambulatory Visit (HOSPITAL_COMMUNITY): Payer: BLUE CROSS/BLUE SHIELD

## 2018-08-21 ENCOUNTER — Ambulatory Visit (HOSPITAL_COMMUNITY): Payer: BLUE CROSS/BLUE SHIELD

## 2018-08-23 ENCOUNTER — Ambulatory Visit (HOSPITAL_COMMUNITY): Payer: BLUE CROSS/BLUE SHIELD

## 2018-08-26 ENCOUNTER — Ambulatory Visit (HOSPITAL_COMMUNITY): Payer: BLUE CROSS/BLUE SHIELD

## 2018-08-28 ENCOUNTER — Ambulatory Visit (HOSPITAL_COMMUNITY): Payer: BLUE CROSS/BLUE SHIELD

## 2018-08-30 ENCOUNTER — Ambulatory Visit (HOSPITAL_COMMUNITY): Payer: BLUE CROSS/BLUE SHIELD

## 2018-09-02 ENCOUNTER — Ambulatory Visit (HOSPITAL_COMMUNITY): Payer: BLUE CROSS/BLUE SHIELD

## 2018-09-06 ENCOUNTER — Ambulatory Visit (HOSPITAL_COMMUNITY): Payer: BLUE CROSS/BLUE SHIELD

## 2018-09-09 ENCOUNTER — Ambulatory Visit (HOSPITAL_COMMUNITY): Payer: BLUE CROSS/BLUE SHIELD

## 2018-09-13 ENCOUNTER — Ambulatory Visit (HOSPITAL_COMMUNITY): Payer: BLUE CROSS/BLUE SHIELD

## 2018-09-16 ENCOUNTER — Ambulatory Visit (HOSPITAL_COMMUNITY): Payer: BLUE CROSS/BLUE SHIELD

## 2018-09-18 ENCOUNTER — Ambulatory Visit (HOSPITAL_COMMUNITY): Payer: BLUE CROSS/BLUE SHIELD

## 2018-10-01 ENCOUNTER — Ambulatory Visit: Payer: Self-pay | Admitting: *Deleted

## 2018-10-01 NOTE — Telephone Encounter (Signed)
Patient's husband is calling to report patient's left leg is swelling and weeping with oozing/blistering from sore on shin. Patient does not have a fever at this time- bot she is in a lot of pain and the area is red. Call to office- Dr Carmelia Roller is not in and there are no openings in the schedule- advised ED for evaluation. Office aware.  Reason for Disposition . [1] Red area or streak [2] large (> 2 in. or 5 cm)  Answer Assessment - Initial Assessment Questions 1. ONSET: "When did the swelling start?" (e.g., minutes, hours, days)     Increased swelling in left leg 2. LOCATION: "What part of the leg is swollen?"  "Are both legs swollen or just one leg?"     Shin below the knee-  Right leg has some swelling and sores- but nothing like the left leg 3. SEVERITY: "How bad is the swelling?" (e.g., localized; mild, moderate, severe)  - Localized - small area of swelling localized to one leg  - MILD pedal edema - swelling limited to foot and ankle, pitting edema < 1/4 inch (6 mm) deep, rest and elevation eliminate most or all swelling  - MODERATE edema - swelling of lower leg to knee, pitting edema > 1/4 inch (6 mm) deep, rest and elevation only partially reduce swelling  - SEVERE edema - swelling extends above knee, facial or hand swelling present      Patient has edema normally- now patient is using lasix and compression. She has all the sudden- pad contact - bandage is red and angry and oozing.  4. REDNESS: "Does the swelling look red or infected?"     Very red and painful- not sure about infection 5. PAIN: "Is the swelling painful to touch?" If so, ask: "How painful is it?"   (Scale 1-10; mild, moderate or severe)     Patient is in a lot of pain when sore is touched 6. FEVER: "Do you have a fever?" If so, ask: "What is it, how was it measured, and when did it start?"      Not sure 7. CAUSE: "What do you think is causing the leg swelling?"     Health issues  8. MEDICAL HISTORY: "Do you have a  history of heart failure, kidney disease, liver failure, or cancer?"     above 9. RECURRENT SYMPTOM: "Have you had leg swelling before?" If so, ask: "When was the last time?" "What happened that time?"     Patient has never had sores on her legs this bad 10. OTHER SYMPTOMS: "Do you have any other symptoms?" (e.g., chest pain, difficulty breathing)       No changes in breathing in the last 24 hours 11. PREGNANCY: "Is there any chance you are pregnant?" "When was your last menstrual period?"       Not asked  Protocols used: LEG SWELLING AND EDEMA-A-AH

## 2018-10-02 ENCOUNTER — Emergency Department (HOSPITAL_BASED_OUTPATIENT_CLINIC_OR_DEPARTMENT_OTHER)
Admission: EM | Admit: 2018-10-02 | Discharge: 2018-10-02 | Disposition: A | Discharge status: Home / Self Care | Payer: BLUE CROSS/BLUE SHIELD | Attending: Emergency Medicine | Admitting: Emergency Medicine

## 2018-10-02 ENCOUNTER — Other Ambulatory Visit: Payer: Self-pay

## 2018-10-02 ENCOUNTER — Encounter (HOSPITAL_BASED_OUTPATIENT_CLINIC_OR_DEPARTMENT_OTHER): Payer: Self-pay | Admitting: Emergency Medicine

## 2018-10-02 DIAGNOSIS — Z87891 Personal history of nicotine dependence: Secondary | ICD-10-CM | POA: Insufficient documentation

## 2018-10-02 DIAGNOSIS — J45909 Unspecified asthma, uncomplicated: Secondary | ICD-10-CM | POA: Insufficient documentation

## 2018-10-02 DIAGNOSIS — I110 Hypertensive heart disease with heart failure: Secondary | ICD-10-CM | POA: Insufficient documentation

## 2018-10-02 DIAGNOSIS — Z7982 Long term (current) use of aspirin: Secondary | ICD-10-CM | POA: Insufficient documentation

## 2018-10-02 DIAGNOSIS — M79662 Pain in left lower leg: Secondary | ICD-10-CM | POA: Diagnosis present

## 2018-10-02 DIAGNOSIS — E119 Type 2 diabetes mellitus without complications: Secondary | ICD-10-CM | POA: Diagnosis not present

## 2018-10-02 DIAGNOSIS — Z7984 Long term (current) use of oral hypoglycemic drugs: Secondary | ICD-10-CM | POA: Insufficient documentation

## 2018-10-02 DIAGNOSIS — Z7902 Long term (current) use of antithrombotics/antiplatelets: Secondary | ICD-10-CM | POA: Diagnosis not present

## 2018-10-02 DIAGNOSIS — I2510 Atherosclerotic heart disease of native coronary artery without angina pectoris: Secondary | ICD-10-CM | POA: Insufficient documentation

## 2018-10-02 DIAGNOSIS — E039 Hypothyroidism, unspecified: Secondary | ICD-10-CM | POA: Diagnosis not present

## 2018-10-02 DIAGNOSIS — I5032 Chronic diastolic (congestive) heart failure: Secondary | ICD-10-CM | POA: Insufficient documentation

## 2018-10-02 DIAGNOSIS — Z79899 Other long term (current) drug therapy: Secondary | ICD-10-CM | POA: Diagnosis not present

## 2018-10-02 DIAGNOSIS — M79605 Pain in left leg: Secondary | ICD-10-CM

## 2018-10-02 MED ORDER — DOXYCYCLINE HYCLATE 100 MG PO TABS
100.0000 mg | ORAL_TABLET | Freq: Once | ORAL | Status: AC
  Administered 2018-10-02: 100 mg via ORAL
  Filled 2018-10-02: qty 1

## 2018-10-02 MED ORDER — DOXYCYCLINE HYCLATE 100 MG PO CAPS: 100.0000 mg | ORAL_CAPSULE | Freq: Two times a day (BID) | ORAL | 0 refills | Status: DC

## 2018-10-02 NOTE — ED Notes (Signed)
ED Provider at bedside. 

## 2018-10-02 NOTE — Discharge Instructions (Addendum)
Your symptoms may be consistent with cellulitis, a skin infection. Please take all of your antibiotics until finished!   You may develop abdominal discomfort or diarrhea from the antibiotic.  You may help offset this with probiotics which you can buy or get in yogurt. Do not eat or take the probiotics until 2 hours after your antibiotic.   Follow-up with your primary care provider for continued management of the suspected cellulitis.  Follow-up with the vascular specialist to assess and treat underlying causes for the ulcers.  Return to the ED for persistent fever, significantly increased pain, spreading redness, pus draining from the wounds, or any other major concerns.

## 2018-10-02 NOTE — ED Triage Notes (Signed)
Large oozing stasis ulcer left leg,dressing inplace.

## 2018-10-02 NOTE — ED Provider Notes (Signed)
[facility] EMERGENCY DEPARTMENT Provider Note   CSN: [id number] Arrival date & time: 10/02/18  1641     History   Chief Complaint Chief Complaint  Patient presents with  . Leg Pain    HPI [name] is a 58 y.o. female.  HPI   [name] is a 58 y.o. female, with a history of CHF, chronic pain, fibromyalgia, HTN, and DM, presenting to the ED with leg pain for the last several months.   She has had lower extremity edema, ulcerations, and weeping over the last several months as well.  Over the last few days, she noticed increased redness and pain to the anterior left lower leg. No recent antibiotic use.  Denies recent fever, recent shortness of breath, chest pain, numbness, weakness, trauma, or any other complaints.   Past Medical History:  Diagnosis Date  . Asthma    "on daily RX and rescue inhaler" (04/18/2018)  . Chronic diastolic CHF (congestive heart failure) (HCC) 09/2015  . Chronic lower back pain   . Chronic neck pain   . Chronic pain syndrome    Fentanyl Patch  . Colon polyps   . Coronary artery disease    cath with normal LM, 30% LAD, 85% mid RCA and 95% distal RCA s/p PCI of the mid to distal RCA and now on DAPT with ASA and Ticagrelor.    . Eczema   . Excessive daytime sleepiness 11/26/2015  . Fibromyalgia   . Gallstones   . GERD (gastroesophageal reflux disease)   . Heart murmur    "noted for the 1st time on 04/18/2018"  . History of blood transfusion 07/2010   "S/P oophorectomy"  . History of gout   . History of hiatal hernia 1980s   "gone now" (04/18/2018)  . Hyperlipidemia   . Hypertension    takes Metoprolol and Enalapril daily  . Hypothyroidism    takes Synthroid daily  . IBS (irritable bowel syndrome)   . Migraine    "nothing in the 2000s" (04/18/2018)  . Mixed connective tissue disease (HCC)   . NAFLD (nonalcoholic fatty liver disease)   . Pneumonia    "several times" (04/18/2018)  . Rheumatoid arthritis (HCC)    "hands,  elbows, shoulders, probably knees" (04/18/2018)  . Scoliosis   . Spondylosis   . Type II diabetes mellitus (HCC)    takes Metformin and Hum R daily (04/18/2018)    Patient Active Problem List   Diagnosis Date Noted  . CAD S/P percutaneous coronary angioplasty 04/19/2018  . Essential hypertension   . Moderate persistent asthma 03/21/2018  . NAFLD (nonalcoholic fatty liver disease)   . Cellulitis 01/02/2018  . Cellulitis of lower leg 01/02/2018  . Chronic diarrhea 05/08/2017  . H/O Clostridium difficile infection 05/08/2017  . Rectal bleeding 05/08/2017  . Hyperbilirubinemia 04/29/2016  . Hyponatremia 04/29/2016  . Sepsis (HCC) 04/28/2016  . Cellulitis of leg, right 03/09/2016  . Mild persistent asthma 02/15/2016  . Allergic rhinitis due to pollen 02/15/2016  . Anaphylactic reaction due to food 02/10/2016  . Coronary artery disease involving native coronary artery of native heart without angina pectoris 02/10/2016  . Gastroesophageal reflux disease without esophagitis 02/10/2016  . Atopic eczema 02/10/2016  . Cervical nerve root disorder 12/15/2015  . Lumbar radiculopathy 12/15/2015  . Daytime somnolence 11/26/2015  . Abnormal cardiovascular stress test 09/15/2015  . C. difficile diarrhea 02/12/2015  . Edema extremities 02/11/2015  . Morbid obesity (HCC) 06/19/2014  . Abnormal LFTs 03/26/2014  .  B-complex deficiency 03/26/2014  . Benign essential HTN 03/26/2014  . Chronic pain associated with significant psychosocial dysfunction 03/26/2014  . Diaphragmatic hernia 03/26/2014  . Gastroesophageal reflux disease 03/26/2014  . Hammer toe 03/26/2014  . H/O neoplasm 03/26/2014  . 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 Diabetic polyneuropathy (HCC) 03/26/2014  . Deafness, sensorineural 03/26/2014  . Fibromyalgia 01/20/2014  . 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 Insulin dependent diabetes mellitus (HCC) 11/11/2012  . Hyperlipidemia LDL goal <70 11/11/2012  . Mixed  connective tissue disease (HCC) 11/11/2012  . Hypothyroidism 11/11/2012  . Uncomplicated asthma 11/11/2012  . 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HCC) 02/20/2012  . 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 ANA positive 07/17/2011    Past Surgical History:  Procedure Laterality Date  . ABDOMINAL HYSTERECTOMY  06/2005   "w/right ovariy"  . APPENDECTOMY    . BREAST BIOPSY Bilateral    9 total (04/18/2018)  . CORONARY ANGIOPLASTY WITH STENT PLACEMENT  10/01/2015   normal LM, 30% LAD, 85% mid RCA and 95% distal RCA s/p PCI of the mid to distal RCA and now on DAPT with ASA and Ticagrelor.    . CORONARY STENT INTERVENTION Right 04/18/2018   Procedure: CORONARY STENT INTERVENTION;  Surgeon: [name], MD;  Location: [facility] INVASIVE CV LAB;  Service: Cardiovascular;  Laterality: Right;  . DILATION AND CURETTAGE OF UTERUS    . FRACTURE SURGERY    . LAPAROSCOPIC CHOLECYSTECTOMY    . LEFT HEART CATH AND CORONARY ANGIOGRAPHY N/A 04/18/2018   Procedure: LEFT HEART CATH AND CORONARY ANGIOGRAPHY;  Surgeon: [name], MD;  Location: [facility]AS[facility] CV [facility];  Service: Cardiovascular;  Laterality: N/A;  . MUSCLE BIOPSY Left    "leg"  . OOPHORECTOMY  07/2010  . RADIOLOGY WITH ANESTHESIA N/A 05/25/2016   Procedure: RADIOLOGY WITH ANESTHESIA;  Surgeon: Medication Radiologist, MD;  Location: [facility] OR;  Service: Radiology;  Laterality: N/A;  . TONSILLECTOMY AND ADENOIDECTOMY    . WRIST FRACTURE SURGERY Left    "crushed it"     OB History   No obstetric history on file.      Home Medications    Prior to Admission medications   Medication Sig Start Date End Date Taking? Authorizing Provider  aspirin EC 81 MG tablet Take 81 mg by mouth daily.    [provider]  augmented betamethasone dipropionate (DIPROLENE-AF) 0.05 % cream APPLY 1 CREAM TOPICALLY ONCE DAILY AS NEEDED FOR  ECZEMA 06/13/18   Wendling, Jilda Roche, DO    benzonatate (TESSALON) 100 MG capsule Take 1 capsule (100 mg total) by mouth 3 (three) times daily as needed for cough. 08/14/18   Nche, Bonna Gains, NP  betamethasone dipropionate (DIPROLENE) 0.05 % cream Apply 1 application topically as needed.    [provider]  Earlie Server [phone] MCG/INH AEPB INHALE 1 PUFF BY MOUTH INTO THE LUNGS ONCE DAILY 08/13/18   Sharlene Dory, DO  clopidogrel (PLAVIX) 75 MG tablet Take 1 tablet (75 mg total) by mouth daily with breakfast. 04/20/18   Robbie Lis M, PA-C  dextromethorphan-guaiFENesin (MUCINEX DM) 30-600 MG 12hr tablet Take 1 tablet by mouth 2 (two) times daily as needed for cough. 08/14/18   Nche, Bonna Gains, NP  diphenhydrAMINE (BENADRYL) 25 MG tablet Take 1 tablet (25 mg total) by mouth every 6 (six) hours as needed for itching or allergies. 04/05/13   Fayrene Helper, PA-C  doxycycline (VIBRAMYCIN) 100 MG capsule Take 1 capsule (  100 mg total) by mouth 2 (two) times daily. 10/02/18   Joy, Shawn C, PA-C  enalapril (VASOTEC) 10 MG tablet Take 1 tablet (10 mg total) 2 (two) times daily by mouth. 07/25/17   Wendling, Jilda Roche, DO  EPINEPHrine (EPIPEN 2-PAK) 0.3 mg/0.3 mL IJ SOAJ injection USE AS DIRECTED FOR SEVERE ALLERGIC REACTION. 10/30/16   Fletcher Anon, MD  Estrogens, Conjugated (PREMARIN VA) Place 1 application vaginally 3 (three) times a week. On Saturdays, Mondays, and Wednesday (cream)    [provider]  fluticasone (FLONASE) 50 MCG/ACT nasal spray Place 2 sprays into both nostrils daily.    [provider]  fluticasone (FLONASE) 50 MCG/ACT nasal spray USE 2 SPRAY(S) IN EACH NOSTRIL ONCE DAILY AS NEEDED FOR ALLERGIES OR  RHINITIS Patient not taking: Reported on 08/14/2018 06/13/18   Sharlene Dory, DO  furosemide (LASIX) 40 MG tablet Take 1 tablet (40 mg total) by mouth daily as needed for fluid. 01/06/18   Marguerita Merles Latif, DO  gabapentin (NEURONTIN) 100 MG capsule Take 400-700 mg by mouth See  admin instructions. Pt takes  in the morning,  at lunchtime and  in the evening    [provider]  GLUCOMANNAN PO Take 700 mg by mouth 2 (two) times daily.    [provider]  glucose blood (BAYER CONTOUR TEST) test strip 1 each 4 (four) times daily.  03/28/17   [provider]  Guaifenesin (MUCINEX MAXIMUM STRENGTH) 1200 MG TB12 Take 1,200 mg by mouth 2 (two) times daily.    [provider]  hydroxychloroquine (PLAQUENIL) 200 MG tablet Take 200 mg by mouth 2 (two) times daily.    [provider]  insulin regular human CONCENTRATED (HUMULIN R) 500 UNIT/ML injection Inject 0-100 Units into the skin 4 (four) times daily -  before meals and at bedtime. Breakfast and lunch <100-125= 5 units 125-150= 35 u 151-200 = 45 u 201-249 = 55 u 250 - 300 = 65 u 301 - 349 = 75 u 350 - 400 = 85 u 401 - 449 = 95 u 450 - 500 = 105 u  bedtime doses <M100 - 150 = 10 u 151-200= 35 u 201 - 300 = 45u 301- 400 = 55 u 401 - 500 = 65 u    [provider]  ipratropium-albuterol (DUONEB) 0.5-2.5 (3) MG/3ML SOLN USE 3 ML IN NEBULIZER  EVERY 4 HOURS AS NEEDED 07/11/18   Wendling, Jilda Roche, DO  levocetirizine (XYZAL) 5 MG tablet TAKE 1 TABLET BY MOUTH EVERY EVENING 08/12/18   Wendling, Jilda Roche, DO  levothyroxine (SYNTHROID, LEVOTHROID) 200 MCG tablet Take 200 mcg by mouth daily before breakfast. Except 100 mcg on Monday    [provider]  metFORMIN (GLUCOPHAGE-XR) 500 MG 24 hr tablet Take 2 tablets (1,000 mg total) by mouth 2 (two) times daily. 04/21/18   Robbie Lis M, PA-C  methylPREDNISolone (MEDROL DOSEPAK) 4 MG TBPK tablet Take as directed on package 08/15/18   Nche, Bonna Gains, NP  metoprolol tartrate (LOPRESSOR) 25 MG tablet Take 1 tablet (25 mg total) by mouth 2 (two) times daily. Please make overdue yearly appt with Dr. Mayford Knife before anymore refills 2nd attempt 03/19/18   Quintella Reichert, MD  milk thistle 175 MG tablet  Take 175 mg by mouth 2 (two) times daily.     [provider]  nitroGLYCERIN (NITROSTAT) 0.4 MG SL tablet Place 1 tablet (0.4 mg total) under the tongue every 5 (five) minutes as needed  for up to 25 doses for chest pain. 04/19/18   Robbie LisSimmons, Brittainy M, PA-C  ondansetron (ZOFRAN) 4 MG tablet Take 4 mg by mouth every 8 (eight) hours as needed for nausea or vomiting.    [provider]  Oxycodone HCl 10 MG TABS Take 1 tablet by mouth 3 (three) times daily as needed (pain).  03/11/18   [provider]  oxymetazoline (AFRIN) 0.05 % nasal spray Place 1 spray into both nostrils 2 (two) times daily as needed for congestion.    [provider]  pantoprazole (PROTONIX) 40 MG tablet TAKE 1 TABLET BY MOUTH ONCE DAILY 08/13/18   Robbie LisSimmons, Brittainy M, PA-C  Polyethyl Glycol-Propyl Glycol (SYSTANE ULTRA OP) Apply 1 drop to eye daily as needed (dry eyes).    [provider]  pramipexole (MIRAPEX) 0.5 MG tablet Take 0.5 mg by mouth 2 (two) times daily.    [provider]  PROAIR HFA 108 [phone](90 Base) MCG/ACT inhaler INHALE 2 PUFFS BY MOUTH EVERY 4 HOURS AS NEEDED FOR WHEEZING AND FOR SHORTNESS OF BREATH 08/12/18   Wendling, Jilda RocheNicholas Paul, DO  promethazine (PHENERGAN) 25 MG tablet TAKE 1 TABLET BY MOUTH EVERY 8 HOURS AS NEEDED FOR NAUSEA AND FOR VOMITING 07/19/18   Wendling, Jilda RocheNicholas Paul, DO  Propylene Glycol (SYSTANE COMPLETE OP) Apply 1 drop to eye as needed (dry eyes).    [provider]  pyridoxine (B-6) 100 MG tablet Take 100 mg by mouth 2 (two) times daily.    [provider]  saccharomyces boulardii (FLORASTOR) 250 MG capsule Take 250 mg by mouth 3 (three) times daily.    [provider]  vitamin A [phone] UNIT capsule Take 10,000 Units by mouth daily.    [provider]  vitamin B-12 (CYANOCOBALAMIN) 1000 MCG tablet Take 1,000 mcg by mouth daily.      [provider]  Vitamin D, Ergocalciferol, (DRISDOL) 1.25 MG (50000 UT)  CAPS capsule TAKE 1 CAPSULE BY MOUTH ONCE A WEEK ON MONDAY Patient not taking: Reported on 08/14/2018 08/13/18   Copland, Gwenlyn FoundJessica C, MD  Vitamin D, Ergocalciferol, (DRISDOL) 50000 units CAPS capsule 50,000 Units every 7 (seven) days. Take on Friday 07/25/17   Sharlene DoryWendling, Nicholas Paul, DO  Vitamins-Lipotropics (LIPO-FLAVONOID PLUS PO) Take 1 tablet by mouth 3 (three) times daily.    [provider]    Family History Family History  Problem Relation Age of Onset  . CAD Mother   . Hypertension Mother   . Heart attack Mother   . CAD Father   . Heart attack Father   . Allergic rhinitis Father   . Asthma Father   . Hypertension Brother   . Hypertension Brother   . Pancreatic cancer Paternal Aunt   . Breast cancer Paternal Aunt   . Lung cancer Paternal Aunt     Social History Social History   Tobacco Use  . Smoking status: Former Smoker    Packs/day: 1.00    Years: 20.00    Pack years: 20.00    Types: Cigarettes    Last attempt to quit: 02/11/2004    Years since quitting: 14.6  . Smokeless tobacco: Never Used  Substance Use Topics  . Alcohol use: Yes    Comment: 04/18/2018 "couple drinks/year"  . Drug use: Not Currently    Types: Marijuana    Comment: "only in my teens"     Allergies   Fish allergy; Fish oil; Omega-3 fatty acids; Other; Crestor [rosuvastatin]; Gabapentin; Lovastatin; Metronidazole; Monascus purpureus went  yeast; Red yeast rice [cholestin]; Rotigotine; Atrovent hfa [ipratropium bromide hfa]; Suprep [na sulfate-k sulfate-mg sulf]; and Tape   Review of Systems Review of Systems  Constitutional: Negative for chills and fever.  Respiratory: Negative for shortness of breath.   Cardiovascular: Negative for chest pain.  Gastrointestinal: Negative for nausea and vomiting.  Skin: Positive for color change and wound.  Neurological: Negative for weakness and numbness.  All other systems reviewed and are negative.    Physical Exam Updated Vital Signs BP  139/66   Pulse 72   Temp 98.1 F (36.7 C) (Oral)   Resp 18   SpO2 100%   Physical Exam Vitals signs and nursing note reviewed.  Constitutional:      General: She is not in acute distress.    Appearance: She is well-developed. She is obese. She is not diaphoretic.  HENT:     Head: Normocephalic and atraumatic.     Mouth/Throat:     Mouth: Mucous membranes are moist.     Pharynx: Oropharynx is clear.  Eyes:     Conjunctiva/sclera: Conjunctivae normal.  Neck:     Musculoskeletal: Neck supple.  Cardiovascular:     Rate and Rhythm: Normal rate and regular rhythm.     Pulses: Normal pulses.          Dorsalis pedis pulses are 2+ on the right side and 2+ on the left side.       Posterior tibial pulses are 2+ on the right side and 2+ on the left side.     Heart sounds: Normal heart sounds.  Pulmonary:     Effort: Pulmonary effort is normal. No respiratory distress.     Breath sounds: Normal breath sounds.  Abdominal:     Palpations: Abdomen is soft.     Tenderness: There is no abdominal tenderness. There is no guarding.  Musculoskeletal:        General: Swelling and tenderness present.     Comments: Some bilateral lower extremity edema.  Scattered lower extremity ulcers. Erythema, tenderness, and weeping noted to the left anterior lower leg.  No purulence noted. No tenderness to the posterior lower legs over the deep venous system.  Lymphadenopathy:     Cervical: No cervical adenopathy.  Skin:    General: Skin is warm and dry.  Neurological:     Mental Status: She is alert.     Comments: Sensation grossly intact to light touch in the lower extremities bilaterally.  Strength 5/5 in the bilateral lower extremities.  Psychiatric:        Mood and Affect: Mood and affect normal.        Speech: Speech normal.        Behavior: Behavior normal.                  ED Treatments / Results  Labs (all labs ordered are listed, but only abnormal results are displayed) Labs  Reviewed - No data to display  EKG None  Radiology No results found.  Procedures Procedures (including critical care time)  Medications Ordered in ED Medications  doxycycline (VIBRA-TABS) tablet 100 mg (has no administration in time range)     Initial Impression / Assessment and Plan / ED Course  I have reviewed the triage vital signs and the nursing notes.  Pertinent labs & imaging results that were available during my care of the patient were reviewed by me and considered in my medical decision making (see chart for details).  Patient presents with erythema, tenderness, and oozing ulcerations to left anterior lower leg. Patient is nontoxic appearing, afebrile, not tachycardic, not tachypneic on my exam, not hypotensive, maintains excellent SPO2 on room air, and is in no apparent distress.  Suspect cellulitis with underlying venous stasis.  Low suspicion for DVT based on timeline of symptoms and physical exam findings.  Follow-up with PCP and vascular specialist. The patient was given instructions for home care as well as return precautions. Patient voices understanding of these instructions, accepts the plan, and is comfortable with discharge.   Findings and plan of care discussed with Alvira Monday, MD.   Vitals:   10/02/18 1701 10/02/18 1910  BP: 139/66 138/75  Pulse: 72 96  Resp: 18 (!) 24  Temp: 98.1 F (36.7 C) 98 F (36.7 C)  TempSrc: Oral Oral  SpO2: 100% 97%      Final Clinical Impressions(s) / ED Diagnoses   Final diagnoses:  Left leg pain    ED Discharge Orders         Ordered    doxycycline (VIBRAMYCIN) 100 MG capsule  2 times daily     10/02/18 1943           Concepcion Living 10/02/18 1953    Alvira Monday, MD 10/04/18 1943

## 2018-10-02 NOTE — ED Triage Notes (Signed)
Leg sores since November. Left leg ucler red painful drainage. More painful last night.

## 2018-10-03 ENCOUNTER — Ambulatory Visit: Payer: BLUE CROSS/BLUE SHIELD | Admitting: Family Medicine

## 2018-10-03 ENCOUNTER — Encounter: Payer: Self-pay | Admitting: Family Medicine

## 2018-10-03 VITALS — BP 128/72 | HR 96 | Temp 97.6°F | Ht 61.0 in | Wt 319.1 lb

## 2018-10-03 DIAGNOSIS — R600 Localized edema: Secondary | ICD-10-CM | POA: Diagnosis not present

## 2018-10-03 DIAGNOSIS — L97901 Non-pressure chronic ulcer of unspecified part of unspecified lower leg limited to breakdown of skin: Secondary | ICD-10-CM | POA: Diagnosis not present

## 2018-10-03 MED ORDER — SILVER SULFADIAZINE 1 % EX CREA: 1.0000 "application " | TOPICAL_CREAM | Freq: Every day | CUTANEOUS | 0 refills | Status: DC

## 2018-10-03 MED ORDER — FUROSEMIDE 40 MG PO TABS: 60.0000 mg | ORAL_TABLET | Freq: Every day | ORAL | 0 refills | Status: DC

## 2018-10-03 NOTE — Progress Notes (Signed)
Pre visit review using our clinic review tool, if applicable. No additional management support is needed unless otherwise documented below in the visit note. 

## 2018-10-03 NOTE — Patient Instructions (Signed)
Send me a message in 1 week regarding how you are doing.   1.5 tabs of Lasix, repeat after 5-6 hours if not getting better.  Try to elevate legs and mind salt intake.  Try to stay active.  Bend your feet when able.  Let us know if you need anything.

## 2018-10-03 NOTE — Progress Notes (Signed)
Chief Complaint  Patient presents with  . Follow-up    Subjective: Patient is a 58 y.o. female here for ED f/u.  Dx with lower ext ulceration in the emergency department.  Given doxycycline and told to follow-up with PCP.  Over the past 2 days, she has had a bigger wound formed.  She has had 1 week of a lesion on her right heel and around 1 week of smaller wounds on her right lower extremity.  No injury.  Whenever she uses any Band-Aids, the adhesive pulls off her skin and she has new wounds form.  Left leg is worse.  She has swelling both of her lower extremities.  She does have a history of coronary artery disease and is prescribed a daily diuretic.  She has been taking it 2-3 times per week lately though.  When she does take it, it takes 2 hours for it to cause her to urinate.   ROS: Skin: +leg ulcers Cardiac: +LE edema  Past Medical History:  Diagnosis Date  . Asthma    "on daily RX and rescue inhaler" (04/18/2018)  . Chronic diastolic CHF (congestive heart failure) (HCC) 09/2015  . Chronic lower back pain   . Chronic neck pain   . Chronic pain syndrome    Fentanyl Patch  . Colon polyps   . Coronary artery disease    cath with normal LM, 30% LAD, 85% mid RCA and 95% distal RCA s/p PCI of the mid to distal RCA and now on DAPT with ASA and Ticagrelor.    . Eczema   . Excessive daytime sleepiness 11/26/2015  . Fibromyalgia   . Gallstones   . GERD (gastroesophageal reflux disease)   . Heart murmur    "noted for the 1st time on 04/18/2018"  . History of blood transfusion 07/2010   "S/P oophorectomy"  . History of gout   . History of hiatal hernia 1980s   "gone now" (04/18/2018)  . Hyperlipidemia   . Hypertension    takes Metoprolol and Enalapril daily  . Hypothyroidism    takes Synthroid daily  . IBS (irritable bowel syndrome)   . Migraine    "nothing in the 2000s" (04/18/2018)  . Mixed connective tissue disease (HCC)   . NAFLD (nonalcoholic fatty liver disease)   . Pneumonia      "several times" (04/18/2018)  . Rheumatoid arthritis (HCC)    "hands, elbows, shoulders, probably knees" (04/18/2018)  . Scoliosis   . Spondylosis   . Type II diabetes mellitus (HCC)    takes Metformin and Hum R daily (04/18/2018)    Objective: BP 128/72 (BP Location: Left Arm, Patient Position: Sitting, Cuff Size: Large)   Pulse 96   Temp 97.6 F (36.4 C) (Oral)   Ht 5\' 1"  (1.549 m)   Wt (!) 319 lb 2 oz (144.8 kg)   SpO2 98%   BMI 60.30 kg/m  General: Awake, appears stated age HEENT: MMM, EOMi Heart: RRR, 2+ LE edema b/l up past knees Lungs: No accessory muscle use Skin: See below Psych: Age appropriate judgment and insight, normal affect and mood   R heel   RLE   LLE  Assessment and Plan: Bilateral lower extremity edema - Plan: furosemide (LASIX) 40 MG tablet, Basic metabolic panel  Ulcer of lower extremity, limited to breakdown of skin, unspecified laterality (HCC) - Plan: silver sulfADIAZINE (SILVADENE) 1 % cream  Check labs today.  She may need to take potassium with her Lasix.  I would like her  to take 60 mg daily, increased from 40 mg, so she starts urinating within 20 minutes after consumption.  If her swelling goes down, I believe this will improve blood flow to these skin lesions and improve healing.  If not, we will need to refer to vascular surgery and consider wound clinic referral as well. She is to send me a message in 1 week regarding how she is doing.  If things worsen, she will let me know. The patient voiced understanding and agreement to the plan.  Jilda Roche Thornton, DO 10/03/18  5:10 PM

## 2018-10-04 LAB — BASIC METABOLIC PANEL
BUN: 11 mg/dL (ref 6–23)
CO2: 27 mEq/L (ref 19–32)
Calcium: 9.9 mg/dL (ref 8.4–10.5)
Chloride: 101 mEq/L (ref 96–112)
Creatinine, Ser: 0.86 mg/dL (ref 0.40–1.20)
GFR: 67.95 mL/min (ref >60.00)
Glucose, Bld: 113 mg/dL — ABNORMAL HIGH (ref 70–99)
Potassium: 4.2 mEq/L (ref 3.5–5.1)
Sodium: 141 mEq/L (ref 135–145)

## 2018-10-07 ENCOUNTER — Other Ambulatory Visit: Payer: Self-pay | Admitting: Family Medicine

## 2018-10-07 DIAGNOSIS — R600 Localized edema: Secondary | ICD-10-CM

## 2018-10-10 ENCOUNTER — Encounter: Payer: Self-pay | Admitting: Family Medicine

## 2018-10-10 ENCOUNTER — Other Ambulatory Visit (INDEPENDENT_AMBULATORY_CARE_PROVIDER_SITE_OTHER): Payer: BLUE CROSS/BLUE SHIELD

## 2018-10-10 DIAGNOSIS — R600 Localized edema: Secondary | ICD-10-CM | POA: Diagnosis not present

## 2018-10-10 LAB — BASIC METABOLIC PANEL
BUN: 19 mg/dL (ref 6–23)
CO2: 28 mEq/L (ref 19–32)
Calcium: 9.1 mg/dL (ref 8.4–10.5)
Chloride: 98 mEq/L (ref 96–112)
Creatinine, Ser: 1 mg/dL (ref 0.40–1.20)
GFR: 57.09 mL/min — ABNORMAL LOW (ref >60.00)
Glucose, Bld: 154 mg/dL — ABNORMAL HIGH (ref 70–99)
Potassium: 4.3 mEq/L (ref 3.5–5.1)
Sodium: 139 mEq/L (ref 135–145)

## 2018-10-17 ENCOUNTER — Ambulatory Visit: Payer: Self-pay

## 2018-10-17 ENCOUNTER — Other Ambulatory Visit: Payer: Self-pay

## 2018-10-17 ENCOUNTER — Encounter (HOSPITAL_BASED_OUTPATIENT_CLINIC_OR_DEPARTMENT_OTHER): Payer: Self-pay | Admitting: Emergency Medicine

## 2018-10-17 ENCOUNTER — Emergency Department (HOSPITAL_BASED_OUTPATIENT_CLINIC_OR_DEPARTMENT_OTHER): Payer: BLUE CROSS/BLUE SHIELD

## 2018-10-17 ENCOUNTER — Inpatient Hospital Stay (HOSPITAL_BASED_OUTPATIENT_CLINIC_OR_DEPARTMENT_OTHER)
Admission: EM | Admit: 2018-10-17 | Discharge: 2018-10-21 | DRG: 871 | Disposition: A | Discharge status: Home / Self Care | Payer: BLUE CROSS/BLUE SHIELD | Attending: Family Medicine | Admitting: Family Medicine

## 2018-10-17 DIAGNOSIS — E785 Hyperlipidemia, unspecified: Secondary | ICD-10-CM | POA: Diagnosis present

## 2018-10-17 DIAGNOSIS — M797 Fibromyalgia: Secondary | ICD-10-CM | POA: Diagnosis present

## 2018-10-17 DIAGNOSIS — Z90710 Acquired absence of both cervix and uterus: Secondary | ICD-10-CM | POA: Diagnosis not present

## 2018-10-17 DIAGNOSIS — I444 Left anterior fascicular block: Secondary | ICD-10-CM | POA: Diagnosis present

## 2018-10-17 DIAGNOSIS — K529 Noninfective gastroenteritis and colitis, unspecified: Secondary | ICD-10-CM | POA: Diagnosis not present

## 2018-10-17 DIAGNOSIS — Z91048 Other nonmedicinal substance allergy status: Secondary | ICD-10-CM | POA: Diagnosis not present

## 2018-10-17 DIAGNOSIS — R6889 Other general symptoms and signs: Secondary | ICD-10-CM

## 2018-10-17 DIAGNOSIS — R6520 Severe sepsis without septic shock: Secondary | ICD-10-CM | POA: Diagnosis present

## 2018-10-17 DIAGNOSIS — G894 Chronic pain syndrome: Secondary | ICD-10-CM | POA: Diagnosis present

## 2018-10-17 DIAGNOSIS — E872 Acidosis: Secondary | ICD-10-CM | POA: Diagnosis present

## 2018-10-17 DIAGNOSIS — L03119 Cellulitis of unspecified part of limb: Secondary | ICD-10-CM | POA: Diagnosis present

## 2018-10-17 DIAGNOSIS — I110 Hypertensive heart disease with heart failure: Secondary | ICD-10-CM | POA: Diagnosis present

## 2018-10-17 DIAGNOSIS — A419 Sepsis, unspecified organism: Secondary | ICD-10-CM | POA: Diagnosis not present

## 2018-10-17 DIAGNOSIS — I5032 Chronic diastolic (congestive) heart failure: Secondary | ICD-10-CM | POA: Diagnosis present

## 2018-10-17 DIAGNOSIS — Z794 Long term (current) use of insulin: Secondary | ICD-10-CM

## 2018-10-17 DIAGNOSIS — A4159 Other Gram-negative sepsis: Secondary | ICD-10-CM | POA: Diagnosis not present

## 2018-10-17 DIAGNOSIS — A401 Sepsis due to streptococcus, group B: Secondary | ICD-10-CM | POA: Diagnosis present

## 2018-10-17 DIAGNOSIS — R112 Nausea with vomiting, unspecified: Secondary | ICD-10-CM | POA: Diagnosis not present

## 2018-10-17 DIAGNOSIS — B955 Unspecified streptococcus as the cause of diseases classified elsewhere: Secondary | ICD-10-CM | POA: Diagnosis not present

## 2018-10-17 DIAGNOSIS — E1140 Type 2 diabetes mellitus with diabetic neuropathy, unspecified: Secondary | ICD-10-CM | POA: Diagnosis present

## 2018-10-17 DIAGNOSIS — J4540 Moderate persistent asthma, uncomplicated: Secondary | ICD-10-CM | POA: Diagnosis present

## 2018-10-17 DIAGNOSIS — Z955 Presence of coronary angioplasty implant and graft: Secondary | ICD-10-CM | POA: Diagnosis not present

## 2018-10-17 DIAGNOSIS — R509 Fever, unspecified: Secondary | ICD-10-CM | POA: Diagnosis not present

## 2018-10-17 DIAGNOSIS — Z87891 Personal history of nicotine dependence: Secondary | ICD-10-CM | POA: Diagnosis not present

## 2018-10-17 DIAGNOSIS — E869 Volume depletion, unspecified: Secondary | ICD-10-CM | POA: Diagnosis not present

## 2018-10-17 DIAGNOSIS — R7881 Bacteremia: Secondary | ICD-10-CM | POA: Diagnosis not present

## 2018-10-17 DIAGNOSIS — L03115 Cellulitis of right lower limb: Secondary | ICD-10-CM | POA: Diagnosis present

## 2018-10-17 DIAGNOSIS — I872 Venous insufficiency (chronic) (peripheral): Secondary | ICD-10-CM | POA: Diagnosis present

## 2018-10-17 DIAGNOSIS — IMO0001 Reserved for inherently not codable concepts without codable children: Secondary | ICD-10-CM

## 2018-10-17 DIAGNOSIS — K760 Fatty (change of) liver, not elsewhere classified: Secondary | ICD-10-CM | POA: Diagnosis present

## 2018-10-17 DIAGNOSIS — Z7951 Long term (current) use of inhaled steroids: Secondary | ICD-10-CM | POA: Diagnosis not present

## 2018-10-17 DIAGNOSIS — G2581 Restless legs syndrome: Secondary | ICD-10-CM | POA: Diagnosis present

## 2018-10-17 DIAGNOSIS — M351 Other overlap syndromes: Secondary | ICD-10-CM | POA: Diagnosis present

## 2018-10-17 DIAGNOSIS — I2510 Atherosclerotic heart disease of native coronary artery without angina pectoris: Secondary | ICD-10-CM | POA: Diagnosis present

## 2018-10-17 DIAGNOSIS — I4891 Unspecified atrial fibrillation: Secondary | ICD-10-CM | POA: Diagnosis present

## 2018-10-17 DIAGNOSIS — K219 Gastro-esophageal reflux disease without esophagitis: Secondary | ICD-10-CM | POA: Diagnosis present

## 2018-10-17 DIAGNOSIS — E1165 Type 2 diabetes mellitus with hyperglycemia: Secondary | ICD-10-CM | POA: Diagnosis present

## 2018-10-17 DIAGNOSIS — J9601 Acute respiratory failure with hypoxia: Secondary | ICD-10-CM | POA: Diagnosis present

## 2018-10-17 DIAGNOSIS — J45909 Unspecified asthma, uncomplicated: Secondary | ICD-10-CM | POA: Diagnosis not present

## 2018-10-17 DIAGNOSIS — E039 Hypothyroidism, unspecified: Secondary | ICD-10-CM | POA: Diagnosis present

## 2018-10-17 DIAGNOSIS — E876 Hypokalemia: Secondary | ICD-10-CM | POA: Diagnosis not present

## 2018-10-17 DIAGNOSIS — Z7982 Long term (current) use of aspirin: Secondary | ICD-10-CM | POA: Diagnosis not present

## 2018-10-17 DIAGNOSIS — Z91018 Allergy to other foods: Secondary | ICD-10-CM | POA: Diagnosis not present

## 2018-10-17 DIAGNOSIS — R1033 Periumbilical pain: Secondary | ICD-10-CM | POA: Diagnosis not present

## 2018-10-17 DIAGNOSIS — B964 Proteus (mirabilis) (morganii) as the cause of diseases classified elsewhere: Secondary | ICD-10-CM | POA: Diagnosis not present

## 2018-10-17 DIAGNOSIS — Z79890 Hormone replacement therapy (postmenopausal): Secondary | ICD-10-CM | POA: Diagnosis not present

## 2018-10-17 DIAGNOSIS — B951 Streptococcus, group B, as the cause of diseases classified elsewhere: Secondary | ICD-10-CM | POA: Diagnosis not present

## 2018-10-17 DIAGNOSIS — Z79899 Other long term (current) drug therapy: Secondary | ICD-10-CM

## 2018-10-17 DIAGNOSIS — R600 Localized edema: Secondary | ICD-10-CM

## 2018-10-17 DIAGNOSIS — Z91013 Allergy to seafood: Secondary | ICD-10-CM | POA: Diagnosis not present

## 2018-10-17 DIAGNOSIS — Z7902 Long term (current) use of antithrombotics/antiplatelets: Secondary | ICD-10-CM

## 2018-10-17 DIAGNOSIS — Z6841 Body Mass Index (BMI) 40.0 and over, adult: Secondary | ICD-10-CM

## 2018-10-17 DIAGNOSIS — M069 Rheumatoid arthritis, unspecified: Secondary | ICD-10-CM | POA: Diagnosis present

## 2018-10-17 DIAGNOSIS — E119 Type 2 diabetes mellitus without complications: Secondary | ICD-10-CM

## 2018-10-17 DIAGNOSIS — Z888 Allergy status to other drugs, medicaments and biological substances status: Secondary | ICD-10-CM | POA: Diagnosis not present

## 2018-10-17 DIAGNOSIS — Z79891 Long term (current) use of opiate analgesic: Secondary | ICD-10-CM

## 2018-10-17 DIAGNOSIS — Z872 Personal history of diseases of the skin and subcutaneous tissue: Secondary | ICD-10-CM | POA: Diagnosis not present

## 2018-10-17 DIAGNOSIS — R0602 Shortness of breath: Secondary | ICD-10-CM | POA: Diagnosis not present

## 2018-10-17 DIAGNOSIS — I878 Other specified disorders of veins: Secondary | ICD-10-CM | POA: Diagnosis present

## 2018-10-17 LAB — CBC WITH DIFFERENTIAL/PLATELET
Abs Immature Granulocytes: 0.14 10*3/uL — ABNORMAL HIGH (ref 0.00–0.07)
Basophils Absolute: 0 10*3/uL (ref 0.0–0.1)
Basophils Relative: 0 %
Eosinophils Absolute: 0.1 10*3/uL (ref 0.0–0.5)
Eosinophils Relative: 0 %
HCT: 42.8 % (ref 36.0–46.0)
Hemoglobin: 13.2 g/dL (ref 12.0–15.0)
Immature Granulocytes: 1 %
Lymphocytes Relative: 5 %
Lymphs Abs: 0.7 10*3/uL (ref 0.7–4.0)
MCH: 27.7 pg (ref 26.0–34.0)
MCHC: 30.8 g/dL (ref 30.0–36.0)
MCV: 89.9 fL (ref 80.0–100.0)
Monocytes Absolute: 0.8 10*3/uL (ref 0.1–1.0)
Monocytes Relative: 5 %
Neutro Abs: 13.7 10*3/uL — ABNORMAL HIGH (ref 1.7–7.7)
Neutrophils Relative %: 89 %
Platelets: 179 10*3/uL (ref 150–400)
RBC: 4.76 MIL/uL (ref 3.87–5.11)
RDW: 14.4 % (ref 11.5–15.5)
WBC: 15.5 10*3/uL — ABNORMAL HIGH (ref 4.0–10.5)
nRBC: 0 % (ref 0.0–0.2)

## 2018-10-17 LAB — COMPREHENSIVE METABOLIC PANEL
ALT: 42 U/L (ref 0–44)
AST: 49 U/L — ABNORMAL HIGH (ref 15–41)
Albumin: 3.5 g/dL (ref 3.5–5.0)
Alkaline Phosphatase: 46 U/L (ref 38–126)
Anion gap: 11 (ref 5–15)
BUN: 11 mg/dL (ref 6–20)
CO2: 25 mmol/L (ref 22–32)
Calcium: 9.2 mg/dL (ref 8.9–10.3)
Chloride: 101 mmol/L (ref 98–111)
Creatinine, Ser: 1.06 mg/dL — ABNORMAL HIGH (ref 0.44–1.00)
GFR calc Af Amer: >60 mL/min (ref >60)
GFR calc non Af Amer: 58 mL/min — ABNORMAL LOW (ref >60)
Glucose, Bld: 231 mg/dL — ABNORMAL HIGH (ref 70–99)
Potassium: 4.2 mmol/L (ref 3.5–5.1)
Sodium: 137 mmol/L (ref 135–145)
Total Bilirubin: 0.6 mg/dL (ref 0.3–1.2)
Total Protein: 6.7 g/dL (ref 6.5–8.1)

## 2018-10-17 LAB — URINALYSIS, MICROSCOPIC (REFLEX)

## 2018-10-17 LAB — URINALYSIS, ROUTINE W REFLEX MICROSCOPIC
Bilirubin Urine: NEGATIVE
Glucose, UA: 100 mg/dL — AB
Hgb urine dipstick: NEGATIVE
Ketones, ur: NEGATIVE mg/dL
Nitrite: NEGATIVE
Protein, ur: NEGATIVE mg/dL
Specific Gravity, Urine: 1.025 (ref 1.005–1.030)
pH: 5.5 (ref 5.0–8.0)

## 2018-10-17 LAB — LIPASE, BLOOD: Lipase: 33 U/L (ref 11–51)

## 2018-10-17 LAB — INFLUENZA PANEL BY PCR (TYPE A & B)
Influenza A By PCR: NEGATIVE
Influenza B By PCR: NEGATIVE

## 2018-10-17 LAB — PROTIME-INR
INR: 1.07
Prothrombin Time: 13.9 seconds (ref 11.4–15.2)

## 2018-10-17 LAB — MRSA PCR SCREENING: MRSA by PCR: NEGATIVE

## 2018-10-17 LAB — LACTIC ACID, PLASMA
Lactic Acid, Venous: 3.9 mmol/L (ref 0.5–1.9)
Lactic Acid, Venous: 4.5 mmol/L (ref 0.5–1.9)

## 2018-10-17 LAB — GROUP A STREP BY PCR: Group A Strep by PCR: NOT DETECTED

## 2018-10-17 LAB — BRAIN NATRIURETIC PEPTIDE: B Natriuretic Peptide: 118.2 pg/mL — ABNORMAL HIGH (ref 0.0–100.0)

## 2018-10-17 MED ORDER — SENNA 8.6 MG PO TABS
1.0000 | ORAL_TABLET | Freq: Two times a day (BID) | ORAL | Status: DC
  Administered 2018-10-18 – 2018-10-21 (×5): 8.6 mg via ORAL
  Filled 2018-10-17 (×7): qty 1

## 2018-10-17 MED ORDER — GABAPENTIN 400 MG PO CAPS
700.0000 mg | ORAL_CAPSULE | Freq: Every day | ORAL | Status: DC
  Administered 2018-10-18 – 2018-10-20 (×4): 700 mg via ORAL
  Filled 2018-10-17 (×4): qty 1

## 2018-10-17 MED ORDER — LACTATED RINGERS IV BOLUS
500.0000 mL | Freq: Once | INTRAVENOUS | Status: AC
  Administered 2018-10-17: 500 mL via INTRAVENOUS

## 2018-10-17 MED ORDER — VANCOMYCIN HCL IN DEXTROSE 1-5 GM/200ML-% IV SOLN: 1000.0000 mg | Freq: Once | INTRAVENOUS | Status: DC

## 2018-10-17 MED ORDER — INSULIN ASPART 100 UNIT/ML ~~LOC~~ SOLN
0.0000 [IU] | Freq: Three times a day (TID) | SUBCUTANEOUS | Status: DC
  Administered 2018-10-18: 3 [IU] via SUBCUTANEOUS
  Administered 2018-10-18: 7 [IU] via SUBCUTANEOUS

## 2018-10-17 MED ORDER — CLOPIDOGREL BISULFATE 75 MG PO TABS
75.0000 mg | ORAL_TABLET | Freq: Every day | ORAL | Status: DC
  Administered 2018-10-18 – 2018-10-21 (×4): 75 mg via ORAL
  Filled 2018-10-17 (×4): qty 1

## 2018-10-17 MED ORDER — CEFEPIME HCL 2 G IJ SOLR
INTRAMUSCULAR | Status: AC
  Filled 2018-10-17: qty 2

## 2018-10-17 MED ORDER — METOPROLOL TARTRATE 25 MG PO TABS
25.0000 mg | ORAL_TABLET | Freq: Two times a day (BID) | ORAL | Status: DC
  Administered 2018-10-18 – 2018-10-19 (×3): 25 mg via ORAL
  Filled 2018-10-17 (×3): qty 1

## 2018-10-17 MED ORDER — ENOXAPARIN SODIUM 40 MG/0.4ML ~~LOC~~ SOLN
40.0000 mg | Freq: Every day | SUBCUTANEOUS | Status: DC
  Administered 2018-10-17 – 2018-10-19 (×3): 40 mg via SUBCUTANEOUS
  Filled 2018-10-17 (×3): qty 0.4

## 2018-10-17 MED ORDER — POLYETHYLENE GLYCOL 3350 17 G PO PACK: 17.0000 g | PACK | Freq: Every day | ORAL | Status: DC | PRN

## 2018-10-17 MED ORDER — VANCOMYCIN HCL IN DEXTROSE 750-5 MG/150ML-% IV SOLN
750.0000 mg | Freq: Three times a day (TID) | INTRAVENOUS | Status: DC
  Administered 2018-10-18 (×2): 750 mg via INTRAVENOUS
  Filled 2018-10-17 (×5): qty 150

## 2018-10-17 MED ORDER — IOPAMIDOL (ISOVUE-300) INJECTION 61%
100.0000 mL | Freq: Once | INTRAVENOUS | Status: AC | PRN
  Administered 2018-10-17: 100 mL via INTRAVENOUS

## 2018-10-17 MED ORDER — ASPIRIN EC 81 MG PO TBEC
81.0000 mg | DELAYED_RELEASE_TABLET | Freq: Every day | ORAL | Status: DC
  Administered 2018-10-18 – 2018-10-21 (×4): 81 mg via ORAL
  Filled 2018-10-17 (×5): qty 1

## 2018-10-17 MED ORDER — PANTOPRAZOLE SODIUM 40 MG PO TBEC
40.0000 mg | DELAYED_RELEASE_TABLET | Freq: Every day | ORAL | Status: DC
  Administered 2018-10-18 – 2018-10-21 (×4): 40 mg via ORAL
  Filled 2018-10-17 (×4): qty 1

## 2018-10-17 MED ORDER — LEVOTHYROXINE SODIUM 100 MCG PO TABS
200.0000 ug | ORAL_TABLET | Freq: Every day | ORAL | Status: DC
  Administered 2018-10-18 – 2018-10-21 (×4): 200 ug via ORAL
  Filled 2018-10-17 (×4): qty 2

## 2018-10-17 MED ORDER — HYDROXYCHLOROQUINE SULFATE 200 MG PO TABS
200.0000 mg | ORAL_TABLET | Freq: Two times a day (BID) | ORAL | Status: DC
  Administered 2018-10-17 – 2018-10-21 (×8): 200 mg via ORAL
  Filled 2018-10-17 (×9): qty 1

## 2018-10-17 MED ORDER — ONDANSETRON HCL 4 MG/2ML IJ SOLN
4.0000 mg | Freq: Four times a day (QID) | INTRAMUSCULAR | Status: DC | PRN
  Administered 2018-10-19 – 2018-10-20 (×6): 4 mg via INTRAVENOUS
  Filled 2018-10-17 (×7): qty 2

## 2018-10-17 MED ORDER — ONDANSETRON HCL 4 MG/2ML IJ SOLN
4.0000 mg | Freq: Once | INTRAMUSCULAR | Status: AC
  Administered 2018-10-17: 4 mg via INTRAVENOUS
  Filled 2018-10-17: qty 2

## 2018-10-17 MED ORDER — VITAMIN B-12 1000 MCG PO TABS
1000.0000 ug | ORAL_TABLET | Freq: Every day | ORAL | Status: DC
  Administered 2018-10-18 – 2018-10-21 (×4): 1000 ug via ORAL
  Filled 2018-10-17 (×4): qty 1

## 2018-10-17 MED ORDER — ONDANSETRON HCL 4 MG PO TABS: 4.0000 mg | ORAL_TABLET | Freq: Four times a day (QID) | ORAL | Status: DC | PRN

## 2018-10-17 MED ORDER — SACCHAROMYCES BOULARDII 250 MG PO CAPS
250.0000 mg | ORAL_CAPSULE | Freq: Three times a day (TID) | ORAL | Status: DC
  Administered 2018-10-17 – 2018-10-21 (×12): 250 mg via ORAL
  Filled 2018-10-17 (×12): qty 1

## 2018-10-17 MED ORDER — VANCOMYCIN HCL 1000 MG IV SOLR
INTRAVENOUS | Status: AC
  Filled 2018-10-17: qty 2000

## 2018-10-17 MED ORDER — SODIUM CHLORIDE 0.9 % IV SOLN
2.0000 g | Freq: Two times a day (BID) | INTRAVENOUS | Status: DC
  Administered 2018-10-18: 2 g via INTRAVENOUS
  Filled 2018-10-17 (×2): qty 2

## 2018-10-17 MED ORDER — OSELTAMIVIR PHOSPHATE 75 MG PO CAPS
75.0000 mg | ORAL_CAPSULE | Freq: Once | ORAL | Status: AC
  Administered 2018-10-17: 75 mg via ORAL
  Filled 2018-10-17: qty 1

## 2018-10-17 MED ORDER — VANCOMYCIN HCL 500 MG IV SOLR
INTRAVENOUS | Status: AC
  Filled 2018-10-17: qty 500

## 2018-10-17 MED ORDER — FENTANYL CITRATE (PF) 100 MCG/2ML IJ SOLN
25.0000 ug | Freq: Once | INTRAMUSCULAR | Status: AC
  Administered 2018-10-17: 25 ug via INTRAVENOUS
  Filled 2018-10-17: qty 2

## 2018-10-17 MED ORDER — OXYCODONE HCL ER 20 MG PO T12A
20.0000 mg | EXTENDED_RELEASE_TABLET | Freq: Two times a day (BID) | ORAL | Status: DC
  Administered 2018-10-18 – 2018-10-21 (×8): 20 mg via ORAL
  Filled 2018-10-17 (×8): qty 1

## 2018-10-17 MED ORDER — ACETAMINOPHEN 325 MG PO TABS
650.0000 mg | ORAL_TABLET | Freq: Four times a day (QID) | ORAL | Status: DC | PRN
  Administered 2018-10-17 – 2018-10-21 (×9): 650 mg via ORAL
  Filled 2018-10-17 (×9): qty 2

## 2018-10-17 MED ORDER — VANCOMYCIN HCL 10 G IV SOLR
2500.0000 mg | Freq: Once | INTRAVENOUS | Status: AC
  Administered 2018-10-17: 2500 mg via INTRAVENOUS
  Filled 2018-10-17: qty 2500

## 2018-10-17 MED ORDER — IPRATROPIUM-ALBUTEROL 0.5-2.5 (3) MG/3ML IN SOLN
3.0000 mL | RESPIRATORY_TRACT | Status: DC | PRN
  Administered 2018-10-18: 3 mL via RESPIRATORY_TRACT
  Filled 2018-10-17: qty 3

## 2018-10-17 MED ORDER — DOCUSATE SODIUM 100 MG PO CAPS
100.0000 mg | ORAL_CAPSULE | Freq: Two times a day (BID) | ORAL | Status: DC
  Administered 2018-10-18 – 2018-10-21 (×5): 100 mg via ORAL
  Filled 2018-10-17 (×7): qty 1

## 2018-10-17 MED ORDER — SODIUM CHLORIDE 0.9 % IV BOLUS
1000.0000 mL | Freq: Once | INTRAVENOUS | Status: AC
  Administered 2018-10-17: 1000 mL via INTRAVENOUS

## 2018-10-17 MED ORDER — PRAMIPEXOLE DIHYDROCHLORIDE 0.25 MG PO TABS
0.5000 mg | ORAL_TABLET | Freq: Two times a day (BID) | ORAL | Status: DC
  Administered 2018-10-17 – 2018-10-19 (×4): 0.5 mg via ORAL
  Filled 2018-10-17 (×4): qty 2

## 2018-10-17 MED ORDER — GABAPENTIN 400 MG PO CAPS
400.0000 mg | ORAL_CAPSULE | ORAL | Status: DC
  Administered 2018-10-18 – 2018-10-21 (×8): 400 mg via ORAL
  Filled 2018-10-17 (×8): qty 1

## 2018-10-17 MED ORDER — FLUTICASONE FUROATE-VILANTEROL 200-25 MCG/INH IN AEPB
1.0000 | INHALATION_SPRAY | Freq: Every day | RESPIRATORY_TRACT | Status: DC
  Administered 2018-10-18 – 2018-10-21 (×4): 1 via RESPIRATORY_TRACT
  Filled 2018-10-17: qty 28

## 2018-10-17 MED ORDER — SODIUM CHLORIDE 0.9 % IV SOLN
2.0000 g | Freq: Once | INTRAVENOUS | Status: AC
  Administered 2018-10-17: 2 g via INTRAVENOUS
  Filled 2018-10-17: qty 2

## 2018-10-17 MED ORDER — SODIUM CHLORIDE 0.9 % IV SOLN
INTRAVENOUS | Status: DC
  Administered 2018-10-17: 17:00:00 via INTRAVENOUS

## 2018-10-17 MED ORDER — ACETAMINOPHEN 325 MG PO TABS
650.0000 mg | ORAL_TABLET | Freq: Once | ORAL | Status: AC
  Administered 2018-10-17: 650 mg via ORAL
  Filled 2018-10-17: qty 2

## 2018-10-17 MED ORDER — ACETAMINOPHEN 650 MG RE SUPP: 650.0000 mg | Freq: Four times a day (QID) | RECTAL | Status: DC | PRN

## 2018-10-17 MED ORDER — OXYCODONE HCL 5 MG PO TABS
10.0000 mg | ORAL_TABLET | Freq: Three times a day (TID) | ORAL | Status: DC | PRN
  Administered 2018-10-18 – 2018-10-21 (×5): 10 mg via ORAL
  Filled 2018-10-17 (×5): qty 2

## 2018-10-17 NOTE — Telephone Encounter (Signed)
FYI. Pt triaged to ED.  

## 2018-10-17 NOTE — ED Notes (Signed)
Patient transported to CT 

## 2018-10-17 NOTE — Telephone Encounter (Signed)
Pt's husband called to report pt fever to 104.2, diarrhea, nausea, lower abdominal pain, vomiting. Fever began this morning. Pt c/o sore throat, cough.  Pt has many comorbidities. Pt stated that she looks weak and the pt stated that her mouth is dry. Advised husband to take pt to the ED now. Husband verbalized understanding.  Reason for Disposition . Patient sounds very sick or weak to the triager  (Exception: mild weakness and hasn't taken fever medicine)  Answer Assessment - Initial Assessment Questions 1. TEMPERATURE: "What is the most recent temperature?"  "How was it measured?"      104.2 2. ONSET: "When did the fever start?"      This morning 3. SYMPTOMS: "Do you have any other symptoms besides the fever?"  (e.g., colds, headache, sore throat, earache, cough, rash, diarrhea, vomiting, abdominal pain)     Headache, sore throat, lower abdominal pain, nausea, vomiting,diarrhea, cough.  4. CAUSE: If there are no symptoms, ask: "What do you think is causing the fever?"      n/a 5. CONTACTS: "Does anyone else in the family have an infection?"     no 6. TREATMENT: "What have you done so far to treat this fever?" (e.g., medications)     no 7. IMMUNOCOMPROMISE: "Do you have of the following: diabetes, HIV positive, splenectomy, cancer chemotherapy, chronic steroid treatment, transplant patient, etc."     Diabetes 8. PREGNANCY: "Is there any chance you are pregnant?" "When was your last menstrual period?"     n/a 9. TRAVEL: "Have you traveled out of the country in the last month?" (e.g., travel history, exposures)    No international travel  Protocols used: FEVER-A-AH

## 2018-10-17 NOTE — ED Notes (Signed)
Date and time results received: 10/17/18 1842 Test: lactic acid Critical Value:4.5 Name of Provider Notified: Zackowski Orders Received? Or Actions Taken?: no orders given

## 2018-10-17 NOTE — ED Notes (Signed)
Date and time results received: 10/17/18 1629   Test: lactic acid Critical Value: 3.9 Name of Provider Notified: Fredirick Maudlin RN Orders Received? Or Actions Taken?: no orders given

## 2018-10-17 NOTE — ED Notes (Signed)
Pt returned from CT °

## 2018-10-17 NOTE — Progress Notes (Addendum)
Pharmacy Antibiotic Note  [name] is a 58 y.o. female admitted on 10/17/2018 with sepsis.  Pharmacy has been consulted for Cefepime and vancomycin dosing. Temp 101.2, tachy.  SCr 1, CrCl ~ 80 mL/min  Plan: Vancomycin 2500mg  IV x 1, then 750mg  IV every 8 hours (calc AUC 501, SCr 1) Cefepime 2g IV every 12 hours Monitor renal function, Cx and clinical progression to narrow Vancomycin levels at steady state  Height: 5\' 1"  (154.9 cm) Weight: (!) 317 lb 7.4 oz (144 kg) IBW/kg (Calculated) : 47.8  Temp (24hrs), Avg:101.2 F (38.4 C), Min:101.2 F (38.4 C), Max:101.2 F (38.4 C)  No results for input(s): WBC, CREATININE, LATICACIDVEN, VANCOTROUGH, VANCOPEAK, VANCORANDOM, GENTTROUGH, GENTPEAK, GENTRANDOM, TOBRATROUGH, TOBRAPEAK, TOBRARND, AMIKACINPEAK, AMIKACINTROU, AMIKACIN in the last 168 hours.  Estimated Creatinine Clearance: 84.6 mL/min (by C-G formula based on SCr of 1 mg/dL).    Allergies  Allergen Reactions  . Fish Allergy Anaphylaxis    INCLUDES OMEGA 3 OILS  . Fish Oil Anaphylaxis and Swelling    THROAT SWELLS INCLUDES FISH AS A CLASS  . Omega-3 Fatty Acids Swelling    Throat swelling  Throat swelling   . Other Anaphylaxis, Other (See Comments), Nausea And Vomiting and Shortness Of Breath    UNSPECIFIED REACTION  Perch Other reaction(s): Other (See Comments) INCLUDES OMEGA 3 OILS Perch UNSPECIFIED REACTION  Perch  . Crestor [Rosuvastatin] Other (See Comments)    Extreme joint pain, to this & other statins  . Gabapentin Anxiety    Anxiety on high doses  . Lovastatin Other (See Comments)    EXTREME JOINT PAIN  . Metronidazole Nausea Only and Swelling    Nausea and vomiting  Other reaction(s): Other (See Comments) Headache and shakes Nausea and vomiting  . Monascus Purpureus Went Yeast Other (See Comments)    Muscle pain and severe joint pain.   . Red Yeast Rice [Cholestin] Other (See Comments)    Muscle pain and severe joint pain.   . Rotigotine Swelling     Extreme edema   . Atrovent Hfa [Ipratropium Bromide Hfa] Other (See Comments)    wheezing  . Suprep [Na Sulfate-K Sulfate-Mg Sulf] Nausea And Vomiting  . Tape Other (See Comments)    Electrodes causes skin breakdown    Antimicrobials this admission: Vanc 2/6>> Cefepime 2/6>>  Dose adjustments this admission: n/a  Microbiology results: 2/6 BCx: sent GAS PCR sent Flu PCR sent  Daylene Posey, PharmD Clinical Pharmacist Please check AMION for all [facility] numbers 10/17/2018 3:38 PM

## 2018-10-17 NOTE — ED Provider Notes (Signed)
[facility] EMERGENCY DEPARTMENT Provider Note   CSN: [id number] Arrival date & time: 10/17/18  1456     History   Chief Complaint Chief Complaint  Patient presents with  . Emesis  . Cough    HPI [name] is a 58 y.o. female.  Patient brought in by POV.  Patient felt well yesterday this morning around 5 in the morning started to feel very sick.  She had sore throat some headache no neck stiffness had some body ache shortness of breath cough nausea and vomiting but no diarrhea.  Patient did not have the flu shot this year.  Patient has a history of chronic diastolic congestive heart failure.  History of chronic low back pain.  History of coronary artery disease.  History of fibromyalgia.  In addition has history of diabetes and hypertension.     Past Medical History:  Diagnosis Date  . Asthma    "on daily RX and rescue inhaler" (04/18/2018)  . Chronic diastolic CHF (congestive heart failure) (HCC) 09/2015  . Chronic lower back pain   . Chronic neck pain   . Chronic pain syndrome    Fentanyl Patch  . Colon polyps   . Coronary artery disease    cath with normal LM, 30% LAD, 85% mid RCA and 95% distal RCA s/p PCI of the mid to distal RCA and now on DAPT with ASA and Ticagrelor.    . Eczema   . Excessive daytime sleepiness 11/26/2015  . Fibromyalgia   . Gallstones   . GERD (gastroesophageal reflux disease)   . Heart murmur    "noted for the 1st time on 04/18/2018"  . History of blood transfusion 07/2010   "S/P oophorectomy"  . History of gout   . History of hiatal hernia 1980s   "gone now" (04/18/2018)  . Hyperlipidemia   . Hypertension    takes Metoprolol and Enalapril daily  . Hypothyroidism    takes Synthroid daily  . IBS (irritable bowel syndrome)   . Migraine    "nothing in the 2000s" (04/18/2018)  . Mixed connective tissue disease (HCC)   . NAFLD (nonalcoholic fatty liver disease)   . Pneumonia    "several times" (04/18/2018)  . Rheumatoid arthritis  (HCC)    "hands, elbows, shoulders, probably knees" (04/18/2018)  . Scoliosis   . Spondylosis   . Type II diabetes mellitus (HCC)    takes Metformin and Hum R daily (04/18/2018)    Patient Active Problem List   Diagnosis Date Noted  . Ulcer of lower extremity, limited to breakdown of skin (HCC) 10/03/2018  . CAD S/P percutaneous coronary angioplasty 04/19/2018  . Essential hypertension   . Moderate persistent asthma 03/21/2018  . NAFLD (nonalcoholic fatty liver disease)   . Cellulitis 01/02/2018  . Cellulitis of lower leg 01/02/2018  . Chronic diarrhea 05/08/2017  . H/O Clostridium difficile infection 05/08/2017  . Rectal bleeding 05/08/2017  . Hyperbilirubinemia 04/29/2016  . Hyponatremia 04/29/2016  . Sepsis (HCC) 04/28/2016  . Cellulitis of leg, right 03/09/2016  . Mild persistent asthma 02/15/2016  . Allergic rhinitis due to pollen 02/15/2016  . Anaphylactic reaction due to food 02/10/2016  . Coronary artery disease involving native coronary artery of native heart without angina pectoris 02/10/2016  . Gastroesophageal reflux disease without esophagitis 02/10/2016  . Atopic eczema 02/10/2016  . Cervical nerve root disorder 12/15/2015  . Lumbar radiculopathy 12/15/2015  . Daytime somnolence 11/26/2015  . Abnormal cardiovascular stress test 09/15/2015  . C. difficile diarrhea  02/12/2015  . Edema extremities 02/11/2015  . Morbid obesity (HCC) 06/19/2014  . Abnormal LFTs 03/26/2014  . B-complex deficiency 03/26/2014  . Benign essential HTN 03/26/2014  . Chronic pain associated with significant psychosocial dysfunction 03/26/2014  . Diaphragmatic hernia 03/26/2014  . Gastroesophageal reflux disease 03/26/2014  . Hammer toe 03/26/2014  . H/O neoplasm 03/26/2014  . 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 Diabetic polyneuropathy (HCC) 03/26/2014  . Deafness, sensorineural 03/26/2014  . Fibromyalgia 01/20/2014  . 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 Insulin dependent  diabetes mellitus (HCC) 11/11/2012  . Hyperlipidemia LDL goal <70 11/11/2012  . Mixed connective tissue disease (HCC) 11/11/2012  . Hypothyroidism 11/11/2012  . Uncomplicated asthma 11/11/2012  . 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HCC) 02/20/2012  . 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 ANA positive 07/17/2011    Past Surgical History:  Procedure Laterality Date  . ABDOMINAL HYSTERECTOMY  06/2005   "w/right ovariy"  . APPENDECTOMY    . BREAST BIOPSY Bilateral    9 total (04/18/2018)  . CORONARY ANGIOPLASTY WITH STENT PLACEMENT  10/01/2015   normal LM, 30% LAD, 85% mid RCA and 95% distal RCA s/p PCI of the mid to distal RCA and now on DAPT with ASA and Ticagrelor.    . CORONARY STENT INTERVENTION Right 04/18/2018   Procedure: CORONARY STENT INTERVENTION;  Surgeon: [name], MD;  Location: [facility] INVASIVE CV LAB;  Service: Cardiovascular;  Laterality: Right;  . DILATION AND CURETTAGE OF UTERUS    . FRACTURE SURGERY    . LAPAROSCOPIC CHOLECYSTECTOMY    . LEFT HEART CATH AND CORONARY ANGIOGRAPHY N/A 04/18/2018   Procedure: LEFT HEART CATH AND CORONARY ANGIOGRAPHY;  Surgeon: [name], MD;  Location: [facility] INVASIVE CV LAB;  Service: Cardiovascular;  Laterality: N/A;  . MUSCLE BIOPSY Left    "leg"  . OOPHORECTOMY  07/2010  . RADIOLOGY WITH ANESTHESIA N/A 05/25/2016   Procedure: RADIOLOGY WITH ANESTHESIA;  Surgeon: Medication Radiologist, MD;  Location: [facility] OR;  Service: Radiology;  Laterality: N/A;  . TONSILLECTOMY AND ADENOIDECTOMY    . WRIST FRACTURE SURGERY Left    "crushed it"     OB History   No obstetric history on file.      Home Medications    Prior to Admission medications   Medication Sig Start Date End Date Taking? Authorizing Provider  aspirin EC 81 MG tablet Take 81 mg by mouth daily.    [provider]  augmented betamethasone dipropionate (DIPROLENE-AF) 0.05 % cream APPLY 1  CREAM TOPICALLY ONCE DAILY AS NEEDED FOR  ECZEMA 06/13/18   Wendling, Jilda Roche, DO  benzonatate (TESSALON) 100 MG capsule Take 1 capsule (100 mg total) by mouth 3 (three) times daily as needed for cough. 08/14/18   Nche, Bonna Gains, NP  betamethasone dipropionate (DIPROLENE) 0.05 % cream Apply 1 application topically as needed.    [provider]  Earlie Server [phone] MCG/INH AEPB INHALE 1 PUFF BY MOUTH INTO THE LUNGS ONCE DAILY 08/13/18   Sharlene Dory, DO  clopidogrel (PLAVIX) 75 MG tablet Take 1 tablet (75 mg total) by mouth daily with breakfast. 04/20/18   Robbie Lis M, PA-C  diphenhydrAMINE (BENADRYL) 25 MG tablet Take 1 tablet (25 mg total) by mouth every 6 (six) hours as needed for itching or allergies. 04/05/13   Fayrene Helper, PA-C  doxycycline (VIBRAMYCIN) 100 MG capsule Take 1 capsule (100 mg total) by mouth 2 (two) times daily. 10/02/18  Joy, Shawn C, PA-C  enalapril (VASOTEC) 10 MG tablet Take 1 tablet (10 mg total) 2 (two) times daily by mouth. 07/25/17   Wendling, Jilda Roche, DO  EPINEPHrine (EPIPEN 2-PAK) 0.3 mg/0.3 mL IJ SOAJ injection USE AS DIRECTED FOR SEVERE ALLERGIC REACTION. 10/30/16   Fletcher Anon, MD  Estrogens, Conjugated (PREMARIN VA) Place 1 application vaginally 3 (three) times a week. On Saturdays, Mondays, and Wednesday (cream)    [provider]  fluticasone (FLONASE) 50 MCG/ACT nasal spray Place 2 sprays into both nostrils daily.    [provider]  fluticasone (FLONASE) 50 MCG/ACT nasal spray USE 2 SPRAY(S) IN EACH NOSTRIL ONCE DAILY AS NEEDED FOR ALLERGIES OR  RHINITIS Patient not taking: Reported on 08/14/2018 06/13/18   Sharlene Dory, DO  furosemide (LASIX) 40 MG tablet Take 1.5 tablets (60 mg total) by mouth daily. 10/03/18   Sharlene Dory, DO  gabapentin (NEURONTIN) 100 MG capsule Take 400-700 mg by mouth See admin instructions. Pt takes 400mg  in the morning, 400mg  at lunchtime and 700mg  in the  evening    [provider]  GLUCOMANNAN PO Take 700 mg by mouth 2 (two) times daily.    [provider]  glucose blood (BAYER CONTOUR TEST) test strip 1 each 4 (four) times daily.  03/28/17   [provider]  Guaifenesin (MUCINEX MAXIMUM STRENGTH) 1200 MG TB12 Take 1,200 mg by mouth 2 (two) times daily.    [provider]  hydroxychloroquine (PLAQUENIL) 200 MG tablet Take 200 mg by mouth 2 (two) times daily.    [provider]  insulin regular human CONCENTRATED (HUMULIN R) 500 UNIT/ML injection Inject 0-100 Units into the skin 4 (four) times daily -  before meals and at bedtime. Breakfast and lunch <100-125= 5 units 125-150= 35 u 151-200 = 45 u 201-249 = 55 u 250 - 300 = 65 u 301 - 349 = 75 u 350 - 400 = 85 u 401 - 449 = 95 u 450 - 500 = 105 u  bedtime doses <M100 - 150 = 10 u 151-200= 35 u 201 - 300 = 45u 301- 400 = 55 u 401 - 500 = 65 u    [provider]  ipratropium-albuterol (DUONEB) 0.5-2.5 (3) MG/3ML SOLN USE 3 ML IN NEBULIZER  EVERY 4 HOURS AS NEEDED 07/11/18   Wendling, Jilda Roche, DO  levocetirizine (XYZAL) 5 MG tablet TAKE 1 TABLET BY MOUTH EVERY EVENING 08/12/18   Wendling, Jilda Roche, DO  levothyroxine (SYNTHROID, LEVOTHROID) 200 MCG tablet Take 200 mcg by mouth daily before breakfast. Except 100 mcg on Monday    [provider]  metFORMIN (GLUCOPHAGE-XR) 500 MG 24 hr tablet Take 2 tablets (1,000 mg total) by mouth 2 (two) times daily. 04/21/18   Robbie Lis M, PA-C  methylPREDNISolone (MEDROL DOSEPAK) 4 MG TBPK tablet Take as directed on package 08/15/18   Nche, Bonna Gains, NP  metoprolol tartrate (LOPRESSOR) 25 MG tablet Take 1 tablet (25 mg total) by mouth 2 (two) times daily. Please make overdue yearly appt with Dr. Mayford Knife before anymore refills 2nd attempt 03/19/18   Quintella Reichert, MD  milk thistle 175 MG tablet Take 175 mg by mouth 2 (two) times daily.     [provider]  nitroGLYCERIN  (NITROSTAT) 0.4 MG SL tablet Place 1 tablet (0.4 mg total) under the tongue every 5 (five) minutes as needed for up to 25 doses for chest pain. 04/19/18   Allayne Butcher, PA-C  ondansetron (ZOFRAN) 4 MG tablet Take 4 mg by mouth every 8 (eight) hours as needed for nausea or vomiting.    [provider]  Oxycodone HCl 10 MG TABS Take 1 tablet by mouth 3 (three) times daily as needed (pain).  03/11/18   [provider]  oxymetazoline (AFRIN) 0.05 % nasal spray Place 1 spray into both nostrils 2 (two) times daily as needed for congestion.    [provider]  pantoprazole (PROTONIX) 40 MG tablet TAKE 1 TABLET BY MOUTH ONCE DAILY 08/13/18   Robbie Lis M, PA-C  Polyethyl Glycol-Propyl Glycol (SYSTANE ULTRA OP) Apply 1 drop to eye daily as needed (dry eyes).    [provider]  pramipexole (MIRAPEX) 0.5 MG tablet Take 0.5 mg by mouth 2 (two) times daily.    [provider]  PROAIR HFA 108 [phone] Base) MCG/ACT inhaler INHALE 2 PUFFS BY MOUTH EVERY 4 HOURS AS NEEDED FOR WHEEZING AND FOR SHORTNESS OF BREATH 08/12/18   Wendling, Jilda Roche, DO  promethazine (PHENERGAN) 25 MG tablet TAKE 1 TABLET BY MOUTH EVERY 8 HOURS AS NEEDED FOR NAUSEA AND FOR VOMITING 07/19/18   Wendling, Jilda Roche, DO  Propylene Glycol (SYSTANE COMPLETE OP) Apply 1 drop to eye as needed (dry eyes).    [provider]  pyridoxine (B-6) 100 MG tablet Take 100 mg by mouth 2 (two) times daily.    [provider]  saccharomyces boulardii (FLORASTOR) 250 MG capsule Take 250 mg by mouth 3 (three) times daily.    [provider]  silver sulfADIAZINE (SILVADENE) 1 % cream Apply 1 application topically daily. 10/03/18   Sharlene Dory, DO  vitamin A 10960 UNIT capsule Take 10,000 Units by mouth daily.    [provider]  vitamin B-12 (CYANOCOBALAMIN) 1000 MCG tablet Take 1,000 mcg by mouth daily.      [provider]  Vitamin D,  Ergocalciferol, (DRISDOL) 1.25 MG (50000 UT) CAPS capsule TAKE 1 CAPSULE BY MOUTH ONCE A WEEK ON MONDAY Patient not taking: Reported on 08/14/2018 08/13/18   Copland, Gwenlyn Found, MD  Vitamin D, Ergocalciferol, (DRISDOL) 50000 units CAPS capsule 50,000 Units every 7 (seven) days. Take on Friday 07/25/17   Sharlene Dory, DO  Vitamins-Lipotropics (LIPO-FLAVONOID PLUS PO) Take 1 tablet by mouth 3 (three) times daily.    [provider]    Family History Family History  Problem Relation Age of Onset  . CAD Mother   . Hypertension Mother   . Heart attack Mother   . CAD Father   . Heart attack Father   . Allergic rhinitis Father   . Asthma Father   . Hypertension Brother   . Hypertension Brother   . Pancreatic cancer Paternal Aunt   . Breast cancer Paternal Aunt   . Lung cancer Paternal Aunt     Social History Social History   Tobacco Use  . Smoking status: Former Smoker    Packs/day: 1.00    Years: 20.00    Pack years: 20.00    Types: Cigarettes    Last attempt to quit: 02/11/2004    Years since quitting: 14.6  . Smokeless tobacco: Never Used  Substance Use Topics  . Alcohol use: Yes    Comment: 04/18/2018 "couple drinks/year"  . Drug use: Not Currently    Types: Marijuana    Comment: "only in my teens"     Allergies   Fish allergy; Fish oil; Omega-3 fatty acids; Other; Crestor [rosuvastatin]; Gabapentin; Lovastatin; Metronidazole;  Monascus purpureus went yeast; Red yeast rice [cholestin]; Rotigotine; Atrovent hfa [ipratropium bromide hfa]; Suprep [na sulfate-k sulfate-mg sulf]; and Tape   Review of Systems Review of Systems  Constitutional: Positive for fever. Negative for chills.  HENT: Positive for congestion and sore throat. Negative for rhinorrhea.   Eyes: Negative for redness and visual disturbance.  Respiratory: Negative for cough and shortness of breath.   Cardiovascular: Positive for leg swelling. Negative for chest pain.  Gastrointestinal:  Positive for nausea and vomiting. Negative for abdominal pain and diarrhea.  Genitourinary: Negative for dysuria.  Musculoskeletal: Negative for back pain and neck pain.  Skin: Negative for rash.  Neurological: Negative for dizziness, syncope, light-headedness and headaches.  Hematological: Does not bruise/bleed easily.  Psychiatric/Behavioral: Negative for confusion.     Physical Exam Updated Vital Signs BP 98/86   Pulse (!) 104   Temp (!) 100.4 F (38 C) (Oral)   Resp 13   Ht 1.549 m (5\' 1" )   Wt (!) 144 kg   SpO2 91%   BMI 59.98 kg/m   Physical Exam Vitals signs and nursing note reviewed.  Constitutional:      General: She is not in acute distress.    Appearance: She is well-developed.  HENT:     Head: Normocephalic and atraumatic.     Nose: Congestion present.  Eyes:     Extraocular Movements: Extraocular movements intact.     Conjunctiva/sclera: Conjunctivae normal.     Pupils: Pupils are equal, round, and reactive to light.  Neck:     Musculoskeletal: Normal range of motion and neck supple. No neck rigidity.  Cardiovascular:     Rate and Rhythm: Regular rhythm. Tachycardia present.     Heart sounds: Normal heart sounds. No murmur.  Pulmonary:     Effort: Pulmonary effort is normal. No respiratory distress.     Breath sounds: Normal breath sounds.  Abdominal:     General: There is distension.     Tenderness: There is no abdominal tenderness.  Musculoskeletal:        General: Swelling present.     Comments: Chronic swelling to both legs.  Some and erythema.  Skin:    General: Skin is warm and dry.     Capillary Refill: Capillary refill takes less than 2 seconds.  Neurological:     General: No focal deficit present.     Mental Status: She is alert and oriented to person, place, and time.      ED Treatments / Results  Labs (all labs ordered are listed, but only abnormal results are displayed) Labs Reviewed  CBC WITH DIFFERENTIAL/PLATELET - Abnormal;  Notable for the following components:      Result Value   WBC 15.5 (*)    Neutro Abs 13.7 (*)    Abs Immature Granulocytes 0.14 (*)    All other components within normal limits  COMPREHENSIVE METABOLIC PANEL - Abnormal; Notable for the following components:   Glucose, Bld 231 (*)    Creatinine, Ser 1.06 (*)    AST 49 (*)    GFR calc non Af Amer 58 (*)    All other components within normal limits  LACTIC ACID, PLASMA - Abnormal; Notable for the following components:   Lactic Acid, Venous 3.9 (*)    All other components within normal limits  LACTIC ACID, PLASMA - Abnormal; Notable for the following components:   Lactic Acid, Venous 4.5 (*)    All other components within normal limits  URINALYSIS, ROUTINE W  REFLEX MICROSCOPIC - Abnormal; Notable for the following components:   Glucose, UA 100 (*)    Leukocytes, UA TRACE (*)    All other components within normal limits  BRAIN NATRIURETIC PEPTIDE - Abnormal; Notable for the following components:   B Natriuretic Peptide 118.2 (*)    All other components within normal limits  URINALYSIS, MICROSCOPIC (REFLEX) - Abnormal; Notable for the following components:   Bacteria, UA RARE (*)    All other components within normal limits  GROUP A STREP BY PCR  CULTURE, BLOOD (ROUTINE X 2)  CULTURE, BLOOD (ROUTINE X 2)  PROTIME-INR  INFLUENZA PANEL BY PCR (TYPE A & B)  LIPASE, BLOOD    EKG EKG Interpretation  Date/Time:  Thursday October 17 2018 15:38:50 EST Ventricular Rate:  115 PR Interval:    QRS Duration: 87 QT Interval:  313 QTC Calculation: 433 R Axis:   -74 Text Interpretation:  Sinus tachycardia Left anterior fascicular block Low voltage, extremity and precordial leads Confirmed by Vanetta Mulders [phone]) on 10/17/2018 3:42:36 PM   Radiology Dg Chest 2 View  Result Date: 10/17/2018 CLINICAL DATA:  Cough, shortness of breath. EXAM: CHEST - 2 VIEW COMPARISON:  01/03/2018 FINDINGS: Rotated to the LEFT. Normal heart size and  mediastinal contours. Azygos fissure noted. No definite infiltrate, pleural effusion or pneumothorax. Hazy opacity of the RIGHT hemithorax versus LEFT may be related to asymmetric superimposed soft tissues, without definite pleural effusion on lateral view. Bones demineralized. IMPRESSION: No definite acute abnormalities. Electronically Signed   By: Ulyses Southward M.D.   On: 10/17/2018 16:47   Ct Abdomen Pelvis W Contrast  Result Date: 10/17/2018 CLINICAL DATA:  58 year old female with abdominal pain and distension. Fever. EXAM: CT ABDOMEN AND PELVIS WITH CONTRAST TECHNIQUE: Multidetector CT imaging of the abdomen and pelvis was performed using the standard protocol following bolus administration of intravenous contrast. CONTRAST:  ISOVUE-300 IOPAMIDOL (ISOVUE-300) INJECTION 61% COMPARISON:  CT Abdomen and Pelvis 12/06/2008. FINDINGS: Lower chest: Stable, negative lung bases. Minimal chronic atelectasis or scarring in both middle lobes. No pericardial or pleural effusion. Hepatobiliary: Surgically absent gallbladder. Hepatic steatosis. No bile duct enlargement. Pancreas: Negative. Spleen: Negative. Adrenals/Urinary Tract: Normal adrenal glands. Bilateral renal enhancement and contrast excretion is symmetric and normal. Decompressed proximal ureters. Diminutive and unremarkable urinary bladder. Stomach/Bowel: Negative rectosigmoid colon. The sigmoid is mildly redundant. Negative descending colon. Mildly redundant but negative transverse colon. Negative right colon and terminal ileum. Appendix is diminutive or absent. No large bowel inflammation. No dilated small bowel. Rectus muscle diastasis but no abdominal hernia identified. No small bowel mesenteric stranding. Negative stomach and duodenum. No free air, free fluid. Vascular/Lymphatic: Aortoiliac calcified atherosclerosis. Suboptimal intravascular contrast bolus but the major arterial structures appear to remain patent. The portal venous system appears  patent. No lymphadenopathy. Reproductive: Surgically absent uterus. Diminutive or absent ovaries. Other: Large body habitus. No pelvic free fluid. Partially visible abdominal panniculus with asymmetric subcutaneous stranding and some dystrophic calcifications (series 3, images 43 and 56. Musculoskeletal: Advanced lumbar disc, endplate and facet degeneration with progression since 2010. No acute osseous abnormality identified. IMPRESSION: 1. No acute or inflammatory process identified in the abdomen or pelvis. 2. Partially visible subcutaneous stranding of the left abdominal panniculus. Query Cellulitis. 3. Hepatic steatosis. 4.  Aortic Atherosclerosis (ICD10-I70.0). Electronically Signed   By: Odessa Fleming M.D.   On: 10/17/2018 19:00    Procedures Procedures (including critical care time)  CRITICAL CARE Performed by: Vanetta Mulders Total critical care time: 30 minutes Critical  care time was exclusive of separately billable procedures and treating other patients. Critical care was necessary to treat or prevent imminent or life-threatening deterioration. Critical care was time spent personally by me on the following activities: development of treatment plan with patient and/or surrogate as well as nursing, discussions with consultants, evaluation of patient's response to treatment, examination of patient, obtaining history from patient or surrogate, ordering and performing treatments and interventions, ordering and review of laboratory studies, ordering and review of radiographic studies, pulse oximetry and re-evaluation of patient's condition.   Medications Ordered in ED Medications  0.9 %  sodium chloride infusion ( Intravenous New Bag/Given 10/17/18 1727)  vancomycin (VANCOCIN) 1000 MG powder (has no administration in time range)  vancomycin (VANCOCIN) 500 MG powder (has no administration in time range)  ceFEPIme (MAXIPIME) 2 g in sodium chloride 0.9 % 100 mL IVPB (has no administration in time range)    vancomycin (VANCOCIN) IVPB 750 mg/150 ml premix (has no administration in time range)  ceFEPIme (MAXIPIME) 2 g injection (  Not Given 10/17/18 1804)  sodium chloride 0.9 % bolus 1,000 mL (1,000 mLs Intravenous New Bag/Given 10/17/18 1909)  ceFEPIme (MAXIPIME) 2 g in sodium chloride 0.9 % 100 mL IVPB (0 g Intravenous Stopped 10/17/18 1720)  sodium chloride 0.9 % bolus 1,000 mL (0 mLs Intravenous Stopped 10/17/18 1721)  ondansetron (ZOFRAN) injection 4 mg (4 mg Intravenous Given 10/17/18 1559)  oseltamivir (TAMIFLU) capsule 75 mg (75 mg Oral Given 10/17/18 1559)  vancomycin (VANCOCIN) 2,500 mg in sodium chloride 0.9 % 500 mL IVPB (2,500 mg Intravenous New Bag/Given 10/17/18 1729)  acetaminophen (TYLENOL) tablet 650 mg (650 mg Oral Given 10/17/18 1721)  iopamidol (ISOVUE-300) 61 % injection 100 mL (100 mLs Intravenous Contrast Given 10/17/18 1834)  fentaNYL (SUBLIMAZE) injection 25 mcg (25 mcg Intravenous Given 10/17/18 1920)     Initial Impression / Assessment and Plan / ED Course  I have reviewed the triage vital signs and the nursing notes.  Pertinent labs & imaging results that were available during my care of the patient were reviewed by me and considered in my medical decision making (see chart for details).    Patient with acute onset of not feeling well today at about 5 in the morning symptoms suggestive of flulike illness with body aches cough, nausea vomiting no diarrhea.  Patient did not have the flu shot.  Patient started on sepsis protocol.  Was not hypotensive and never did get hypotensive.  Initial lactic acid was very borderline at 3.9.  And then did go up to 4.5.  Initially with a been 3.9 her having history of congestive heart failure patient only received 1 L of fluid BNP was elevated chest x-ray suggestive may be some mild pulmonary edema.  But when her lactic acid went up to 4.5 patient received second liter of fluid.  With this heart rate came down to around 100 patient feeling much better  systolic pressures were up into the 120 range.  Patient had orders to go on and continue the additional fluids.  Patient had a complaint of abdominal pain so a CT of the abdomen without any acute findings.  Patient started on Tamiflu rapid strep was negative and patient started on broad-spectrum antibiotics.  Overall clinically patient showed significant signs of improvement here.  Feel that patient may very well be showing signs of sepsis related to influenza.  Discussed with hospitalist they will admit.  To the stepdown unit.   Final Clinical Impressions(s) / ED Diagnoses  Final diagnoses:  Sepsis, due to unspecified organism, unspecified whether acute organ dysfunction present [facility](HCC)  Flu-like symptoms    ED Discharge Orders    None       Vanetta MuldersZackowski, Shereece Wellborn, MD 10/17/18 2159

## 2018-10-17 NOTE — ED Notes (Signed)
Pt resting in NAD, family at bedside.

## 2018-10-17 NOTE — ED Notes (Signed)
[facility] notified (Kim) - patient ready for transport 

## 2018-10-17 NOTE — H&P (Signed)
History and Physical    [name] [name]:[id number] DOB: May 22, 1961 DOA: 10/17/2018  PCP: Sharlene Dory, DO Patient coming from: [facility]  I have personally briefly reviewed patient's old medical records in [facility]  Chief Complaint: Fever, nausea, vomiting  HPI: [name] is a 58 y.o. female with medical history significant for CAD status post PCI, heart failure with preserved ejection fraction, hypertension, mixed connective tissue disorder, moderate persistent asthma, morbid obesity, IBS, chronic pain, type 2 diabetes mellitus with diabetic neuropathy, hypothyroidism and restless leg syndrome who presented to the med [facility] ED with 1 day of fever, periumbilical abdominal pain, nausea and 4 episodes of nonbloody, nonbilious emesis.  Patient reports that she was in her usual state of health until waking up in the morning, at which time she felt quite nauseated and had multiple episodes of vomiting.  She states that she has no recent sick contacts with similar symptoms.  She notes that she ate a leftover salad from [employer] the night prior to her symptoms.  She endorses chronic cough occasionally productive of yellow sputum that is largely unchanged from baseline and denies shortness of breath above her baseline.  She was recently treated with 1 week of doxycycline for left lower extremity cellulitis from 1/22 through 1/29, with subsequent improvement in the appearance of that leg.  She feels much improved after initial resuscitation in the emergency department.  ED Course: In the ED, patient febrile to 102.9, tachycardic into the low 120s, normotensive and saturating in the low 90s on room air.  Labs notable for WBC 15.5, Hgb 13.2, platelets 179, normal renal function and electrolytes, lipase 33, lactic acid 3.9 initially, with subsequent increase to 4.5.  Rapid flu negative.  Urinalysis showed trace leukocytes, negative nitrites and rare bacteria.  Chest x-ray  demonstrated hazy opacification of the right hemithorax, thought to be secondary to patient positioning with soft tissue shadowing.  CT abdomen pelvis with contrast was obtained demonstrating no acute intra-abdominal process, normal-appearing lung bases and subcutaneous stranding of the pannus possibly consistent with cellulitis.  EKG demonstrated sinus tachycardia with a rate of 115 and a new left anterior fascicular block.  Patient received vancomycin and cefepime for undifferentiated sepsis prior to transfer.  Review of Systems: As per HPI otherwise 10 point review of systems negative.   Past Medical History:  Diagnosis Date  . Asthma    "on daily RX and rescue inhaler" (04/18/2018)  . Chronic diastolic CHF (congestive heart failure) (HCC) 09/2015  . Chronic lower back pain   . Chronic neck pain   . Chronic pain syndrome    Fentanyl Patch  . Colon polyps   . Coronary artery disease    cath with normal LM, 30% LAD, 85% mid RCA and 95% distal RCA s/p PCI of the mid to distal RCA and now on DAPT with ASA and Ticagrelor.    . Eczema   . Excessive daytime sleepiness 11/26/2015  . Fibromyalgia   . Gallstones   . GERD (gastroesophageal reflux disease)   . Heart murmur    "noted for the 1st time on 04/18/2018"  . History of blood transfusion 07/2010   "S/P oophorectomy"  . History of gout   . History of hiatal hernia 1980s   "gone now" (04/18/2018)  . Hyperlipidemia   . Hypertension    takes Metoprolol and Enalapril daily  . Hypothyroidism    takes Synthroid daily  . IBS (irritable bowel syndrome)   . Migraine    "  nothing in the 2000s" (04/18/2018)  . Mixed connective tissue disease (HCC)   . NAFLD (nonalcoholic fatty liver disease)   . Pneumonia    "several times" (04/18/2018)  . Rheumatoid arthritis (HCC)    "hands, elbows, shoulders, probably knees" (04/18/2018)  . Scoliosis   . Spondylosis   . Type II diabetes mellitus (HCC)    takes Metformin and Hum R daily (04/18/2018)    Past  Surgical History:  Procedure Laterality Date  . ABDOMINAL HYSTERECTOMY  06/2005   "w/right ovariy"  . APPENDECTOMY    . BREAST BIOPSY Bilateral    9 total (04/18/2018)  . CORONARY ANGIOPLASTY WITH STENT PLACEMENT  10/01/2015   normal LM, 30% LAD, 85% mid RCA and 95% distal RCA s/p PCI of the mid to distal RCA and now on DAPT with ASA and Ticagrelor.    . CORONARY STENT INTERVENTION Right 04/18/2018   Procedure: CORONARY STENT INTERVENTION;  Surgeon: [name], MD;  Location: [facility] INVASIVE CV LAB;  Service: Cardiovascular;  Laterality: Right;  . DILATION AND CURETTAGE OF UTERUS    . FRACTURE SURGERY    . LAPAROSCOPIC CHOLECYSTECTOMY    . LEFT HEART CATH AND CORONARY ANGIOGRAPHY N/A 04/18/2018   Procedure: LEFT HEART CATH AND CORONARY ANGIOGRAPHY;  Surgeon: [name], MD;  Location: [facility] INVASIVE CV LAB;  Service: Cardiovascular;  Laterality: N/A;  . MUSCLE BIOPSY Left    "leg"  . OOPHORECTOMY  07/2010  . RADIOLOGY WITH ANESTHESIA N/A 05/25/2016   Procedure: RADIOLOGY WITH ANESTHESIA;  Surgeon: Medication Radiologist, MD;  Location: [facility] OR;  Service: Radiology;  Laterality: N/A;  . TONSILLECTOMY AND ADENOIDECTOMY    . WRIST FRACTURE SURGERY Left    "crushed it"     reports that she quit smoking about 14 years ago. Her smoking use included cigarettes. She has a 20.00 pack-year smoking history. She has never used smokeless tobacco. She reports current alcohol use. She reports previous drug use. Drug: Marijuana.  Allergies  Allergen Reactions  . Fish Allergy Anaphylaxis    INCLUDES OMEGA 3 OILS  . Fish Oil Anaphylaxis and Swelling    THROAT SWELLS INCLUDES FISH AS A CLASS  . Omega-3 Fatty Acids Swelling    Throat swelling  Throat swelling   . Other Anaphylaxis, Other (See Comments), Nausea And Vomiting and Shortness Of Breath    UNSPECIFIED REACTION  Perch Other reaction(s): Other (See Comments) INCLUDES OMEGA 3 OILS Perch UNSPECIFIED REACTION  Perch  .  Crestor [Rosuvastatin] Other (See Comments)    Extreme joint pain, to this & other statins  . Gabapentin Anxiety    Anxiety on high doses  . Lovastatin Other (See Comments)    EXTREME JOINT PAIN  . Metronidazole Nausea Only and Swelling    Nausea and vomiting  Other reaction(s): Other (See Comments) Headache and shakes Nausea and vomiting  . Monascus Purpureus Went Yeast Other (See Comments)    Muscle pain and severe joint pain.   . Red Yeast Rice [Cholestin] Other (See Comments)    Muscle pain and severe joint pain.   . Rotigotine Swelling    Extreme edema   . Atrovent Hfa [Ipratropium Bromide Hfa] Other (See Comments)    wheezing  . Suprep [Na Sulfate-K Sulfate-Mg Sulf] Nausea And Vomiting  . Tape Other (See Comments)    Electrodes causes skin breakdown    Family History  Problem Relation Age of Onset  . CAD Mother   . Hypertension Mother   . Heart attack Mother   .  CAD Father   . Heart attack Father   . Allergic rhinitis Father   . Asthma Father   . Hypertension Brother   . Hypertension Brother   . Pancreatic cancer Paternal Aunt   . Breast cancer Paternal Aunt   . Lung cancer Paternal Aunt     Prior to Admission medications   Medication Sig Start Date End Date Taking? Authorizing Provider  aspirin EC 81 MG tablet Take 81 mg by mouth daily.   Yes [provider]  augmented betamethasone dipropionate (DIPROLENE-AF) 0.05 % cream APPLY 1 CREAM TOPICALLY ONCE DAILY AS NEEDED FOR  ECZEMA Patient taking differently: Apply 1 application topically daily as needed (eczema).  06/13/18  Yes Wendling, Jilda Roche, DO  BREO ELLIPTA 200-25 MCG/INH AEPB INHALE 1 PUFF BY MOUTH INTO THE LUNGS ONCE DAILY Patient taking differently: Inhale 1 puff into the lungs daily.  08/13/18  Yes Sharlene Dory, DO  clopidogrel (PLAVIX) 75 MG tablet Take 1 tablet (75 mg total) by mouth daily with breakfast. 04/20/18  Yes Robbie Lis M, PA-C  diphenhydrAMINE (BENADRYL) 25  MG tablet Take 1 tablet (25 mg total) by mouth every 6 (six) hours as needed for itching or allergies. 04/05/13  Yes Fayrene Helper, PA-C  enalapril (VASOTEC) 10 MG tablet Take 1 tablet (10 mg total) 2 (two) times daily by mouth. 07/25/17  Yes Wendling, Jilda Roche, DO  EPINEPHrine (EPIPEN 2-PAK) 0.3 mg/0.3 mL IJ SOAJ injection USE AS DIRECTED FOR SEVERE ALLERGIC REACTION. 10/30/16  Yes Bardelas, Bonnita Hollow, MD  Estrogens, Conjugated (PREMARIN VA) Place 1 application vaginally 3 (three) times a week. On Saturdays, Mondays, and Wednesday (cream)   Yes [provider]  fluticasone (FLONASE) 50 MCG/ACT nasal spray USE 2 SPRAY(S) IN EACH NOSTRIL ONCE DAILY AS NEEDED FOR ALLERGIES OR  RHINITIS Patient taking differently: Place 1 spray into both nostrils daily.  06/13/18  Yes Sharlene Dory, DO  furosemide (LASIX) 40 MG tablet Take 1.5 tablets (60 mg total) by mouth daily. Patient taking differently: Take 60 mg by mouth daily as needed for fluid.  10/03/18  Yes Sharlene Dory, DO  gabapentin (NEURONTIN) 100 MG capsule Take 400-700 mg by mouth See admin instructions. Pt takes  in the morning,  at lunchtime and  in the evening   Yes [provider]  Guaifenesin (MUCINEX MAXIMUM STRENGTH) 1200 MG TB12 Take 1,200 mg by mouth 2 (two) times daily.   Yes [provider]  hydroxychloroquine (PLAQUENIL) 200 MG tablet Take 200 mg by mouth 2 (two) times daily.   Yes [provider]  insulin regular human CONCENTRATED (HUMULIN R) 500 UNIT/ML injection Inject 0-100 Units into the skin 4 (four) times daily -  before meals and at bedtime. Breakfast and lunch <100-125= 5 units 125-150= 35 u 151-200 = 45 u 201-249 = 55 u 250 - 300 = 65 u 301 - 349 = 75 u 350 - 400 = 85 u 401 - 449 = 95 u 450 - 500 = 105 u  bedtime doses <M100 - 150 = 10 u 151-200= 35 u 201 - 300 = 45u 301- 400 = 55 u 401 - 500 = 65 u   Yes [provider]  ipratropium-albuterol  (DUONEB) 0.5-2.5 (3) MG/3ML SOLN USE 3 ML IN NEBULIZER  EVERY 4 HOURS AS NEEDED Patient taking differently: Inhale 3 mLs into the lungs every 4 (four) hours as needed (sob).  07/11/18  Yes Sharlene Dory, DO  levocetirizine (XYZAL) 5 MG tablet  TAKE 1 TABLET BY MOUTH EVERY EVENING Patient taking differently: Take 5 mg by mouth daily.  08/12/18  Yes Sharlene DoryWendling, Nicholas Paul, DO  levothyroxine (SYNTHROID, LEVOTHROID) 200 MCG tablet Take 200 mcg by mouth daily before breakfast. Except 100 mcg on Monday   Yes [provider]  metFORMIN (GLUCOPHAGE-XR) 500 MG 24 hr tablet Take 2 tablets (1,000 mg total) by mouth 2 (two) times daily. 04/21/18  Yes Robbie LisSimmons, Brittainy M, PA-C  metoprolol tartrate (LOPRESSOR) 25 MG tablet Take 1 tablet (25 mg total) by mouth 2 (two) times daily. Please make overdue yearly appt with Dr. Mayford Knifeurner before anymore refills 2nd attempt 03/19/18  Yes Turner, Cornelious Bryantraci R, MD  nitroGLYCERIN (NITROSTAT) 0.4 MG SL tablet Place 1 tablet (0.4 mg total) under the tongue every 5 (five) minutes as needed for up to 25 doses for chest pain. 04/19/18  Yes Simmons, Brittainy M, PA-C  ondansetron (ZOFRAN) 4 MG tablet Take 4 mg by mouth every 8 (eight) hours as needed for nausea or vomiting.   Yes [provider]  Oxycodone HCl 10 MG TABS Take 1 tablet by mouth 3 (three) times daily as needed (pain).  03/11/18  Yes [provider]  pantoprazole (PROTONIX) 40 MG tablet TAKE 1 TABLET BY MOUTH ONCE DAILY Patient taking differently: Take 40 mg by mouth daily.  08/13/18  Yes Simmons, Brittainy M, PA-C  Polyethyl Glycol-Propyl Glycol (SYSTANE ULTRA OP) Apply 1 drop to eye daily as needed (dry eyes).   Yes [provider]  pramipexole (MIRAPEX) 0.5 MG tablet Take 0.5 mg by mouth 2 (two) times daily.   Yes [provider]  PROAIR HFA 108 (90 Base) MCG/ACT inhaler INHALE 2 PUFFS BY MOUTH EVERY 4 HOURS AS NEEDED FOR WHEEZING AND FOR SHORTNESS OF BREATH Patient taking  differently: Inhale 2 puffs into the lungs every 4 (four) hours as needed for wheezing or shortness of breath.  08/12/18  Yes Wendling, Jilda RocheNicholas Paul, DO  promethazine (PHENERGAN) 25 MG tablet TAKE 1 TABLET BY MOUTH EVERY 8 HOURS AS NEEDED FOR NAUSEA AND FOR VOMITING Patient taking differently: Take 25 mg by mouth every 8 (eight) hours as needed for nausea or vomiting.  07/19/18  Yes Wendling, Jilda RocheNicholas Paul, DO  saccharomyces boulardii (FLORASTOR) 250 MG capsule Take 250 mg by mouth 3 (three) times daily.   Yes [provider]  silver sulfADIAZINE (SILVADENE) 1 % cream Apply 1 application topically daily. 10/03/18  Yes Sharlene DoryWendling, Nicholas Paul, DO  Vitamin D, Ergocalciferol, (DRISDOL) 50000 units CAPS capsule 50,000 Units every 7 (seven) days. Take on Friday 07/25/17  Yes Wendling, Jilda RocheNicholas Paul, DO  XTAMPZA ER 18 MG C12A Take 18 mg by mouth 2 times daily at 12 noon and 4 pm. 08/26/18  Yes [provider]  GLUCOMANNAN PO Take 700 mg by mouth 2 (two) times daily.    [provider]  glucose blood (BAYER CONTOUR TEST) test strip 1 each 4 (four) times daily.  03/28/17   [provider]  milk thistle 175 MG tablet Take 175 mg by mouth 2 (two) times daily.     [provider]  pyridoxine (B-6) 100 MG tablet Take 100 mg by mouth 2 (two) times daily.    [provider]  vitamin A 1610910000 UNIT capsule Take 10,000 Units by mouth daily.    [provider]  vitamin B-12 (CYANOCOBALAMIN) 1000 MCG tablet Take 1,000 mcg by mouth daily.      [provider]  Vitamin D, Ergocalciferol, (DRISDOL) 1.25  MG (50000 UT) CAPS capsule TAKE 1 CAPSULE BY MOUTH ONCE A WEEK ON MONDAY Patient not taking: Reported on 08/14/2018 08/13/18   Copland, Gwenlyn Found, MD  Vitamins-Lipotropics (LIPO-FLAVONOID PLUS PO) Take 1 tablet by mouth 3 (three) times daily.    [provider]    Physical Exam: Vitals:   10/17/18 1855 10/17/18 2051 10/17/18 2232 10/17/18 2235    BP: 98/86 105/64  (!) 115/54  Pulse: (!) 104 (!) 102    Resp: Temp:   99.8 F (37.7 C)   TempSrc:   Oral   SpO2: 91% 92%    Weight:   (!) 144.2 kg   Height:    (1.549 m)     Constitutional: obese Caucasian woman in NAD, calm, comfortable Eyes: PERRL, lids and conjunctivae normal ENMT: Mucous membranes are moist. Posterior pharynx clear of any exudate or lesions. Neck: normal, supple, no masses Respiratory: clear to auscultation bilaterally, no wheezing, no crackles. Normal respiratory effort. Cardiovascular: Tachycardic, no murmurs / rubs / gallops. 1+ lower extremity edema. 2+ pedal pulses. Abdomen: Periumbilical TTP. Bowel sounds positive.  Musculoskeletal: no clubbing / cyanosis. No joint deformity upper and lower extremities. Good ROM, no contractures. Normal muscle tone.  Skin: Significant improvement in appearance of ulceration and cellulitis of LLE as compared to 1/22, mild erythema without rubor of right shin Neurologic: CN 2-12 grossly intact. Sensation diminished in stocking-glove pattern. Strength 5/5 in all 4.  Psychiatric: Normal judgment and insight. Alert and oriented x 3. Normal mood.   Labs on Admission: I have personally reviewed following labs and imaging studies  CBC: Recent Labs  Lab 10/17/18 1534  WBC 15.5*  NEUTROABS 13.7*  HGB 13.2  HCT 42.8  MCV 89.9  PLT 179   Basic Metabolic Panel: Recent Labs  Lab 10/17/18 1534  NA 137  K 4.2  CL 101  CO2 25  GLUCOSE 231*  BUN 11  CREATININE 1.06*  CALCIUM 9.2   GFR: Estimated Creatinine Clearance: 79.9 mL/min (A) (by C-G formula based on SCr of 1.06 mg/dL (H)). Liver Function Tests: Recent Labs  Lab 10/17/18 1534  AST 49*  ALT 42  ALKPHOS 46  BILITOT 0.6  PROT 6.7  ALBUMIN 3.5   Recent Labs  Lab 10/17/18 1534  LIPASE 33   No results for input(s): AMMONIA in the last 168 hours. Coagulation Profile: Recent Labs  Lab 10/17/18 1534  INR 1.07   Cardiac Enzymes: No  results for input(s): CKTOTAL, CKMB, CKMBINDEX, TROPONINI in the last 168 hours. BNP (last 3 results) No results for input(s): PROBNP in the last 8760 hours. HbA1C: No results for input(s): HGBA1C in the last 72 hours. CBG: No results for input(s): GLUCAP in the last 168 hours. Lipid Profile: No results for input(s): CHOL, HDL, LDLCALC, TRIG, CHOLHDL, LDLDIRECT in the last 72 hours. Thyroid Function Tests: No results for input(s): TSH, T4TOTAL, FREET4, T3FREE, THYROIDAB in the last 72 hours. Anemia Panel: No results for input(s): VITAMINB12, FOLATE, FERRITIN, TIBC, IRON, RETICCTPCT in the last 72 hours. Urine analysis:    Component Value Date/Time   COLORURINE YELLOW 10/17/2018 1818   APPEARANCEUR CLEAR 10/17/2018 1818   LABSPEC 1.025 10/17/2018 1818   PHURINE 5.5 10/17/2018 1818   GLUCOSEU 100 (A) 10/17/2018 1818   HGBUR NEGATIVE 10/17/2018 1818   BILIRUBINUR NEGATIVE 10/17/2018 1818   KETONESUR NEGATIVE 10/17/2018 1818   PROTEINUR NEGATIVE 10/17/2018 1818   UROBILINOGEN 0.2 02/10/2015 1835   NITRITE NEGATIVE 10/17/2018 1818  LEUKOCYTESUR TRACE (A) 10/17/2018 1818    Radiological Exams on Admission: Dg Chest 2 View  Result Date: 10/17/2018 CLINICAL DATA:  Cough, shortness of breath. EXAM: CHEST - 2 VIEW COMPARISON:  01/03/2018 FINDINGS: Rotated to the LEFT. Normal heart size and mediastinal contours. Azygos fissure noted. No definite infiltrate, pleural effusion or pneumothorax. Hazy opacity of the RIGHT hemithorax versus LEFT may be related to asymmetric superimposed soft tissues, without definite pleural effusion on lateral view. Bones demineralized. IMPRESSION: No definite acute abnormalities. Electronically Signed   By: Ulyses Southward M.D.   On: 10/17/2018 16:47   Ct Abdomen Pelvis W Contrast  Result Date: 10/17/2018 CLINICAL DATA:  58 year old female with abdominal pain and distension. Fever. EXAM: CT ABDOMEN AND PELVIS WITH CONTRAST TECHNIQUE: Multidetector CT imaging of the  abdomen and pelvis was performed using the standard protocol following bolus administration of intravenous contrast. CONTRAST:  ISOVUE-300 IOPAMIDOL (ISOVUE-300) INJECTION 61% COMPARISON:  CT Abdomen and Pelvis 12/06/2008. FINDINGS: Lower chest: Stable, negative lung bases. Minimal chronic atelectasis or scarring in both middle lobes. No pericardial or pleural effusion. Hepatobiliary: Surgically absent gallbladder. Hepatic steatosis. No bile duct enlargement. Pancreas: Negative. Spleen: Negative. Adrenals/Urinary Tract: Normal adrenal glands. Bilateral renal enhancement and contrast excretion is symmetric and normal. Decompressed proximal ureters. Diminutive and unremarkable urinary bladder. Stomach/Bowel: Negative rectosigmoid colon. The sigmoid is mildly redundant. Negative descending colon. Mildly redundant but negative transverse colon. Negative right colon and terminal ileum. Appendix is diminutive or absent. No large bowel inflammation. No dilated small bowel. Rectus muscle diastasis but no abdominal hernia identified. No small bowel mesenteric stranding. Negative stomach and duodenum. No free air, free fluid. Vascular/Lymphatic: Aortoiliac calcified atherosclerosis. Suboptimal intravascular contrast bolus but the major arterial structures appear to remain patent. The portal venous system appears patent. No lymphadenopathy. Reproductive: Surgically absent uterus. Diminutive or absent ovaries. Other: Large body habitus. No pelvic free fluid. Partially visible abdominal panniculus with asymmetric subcutaneous stranding and some dystrophic calcifications (series 3, images 43 and 56. Musculoskeletal: Advanced lumbar disc, endplate and facet degeneration with progression since 2010. No acute osseous abnormality identified. IMPRESSION: 1. No acute or inflammatory process identified in the abdomen or pelvis. 2. Partially visible subcutaneous stranding of the left abdominal panniculus. Query Cellulitis. 3.  Hepatic steatosis. 4.  Aortic Atherosclerosis (ICD10-I70.0). Electronically Signed   By: Odessa Fleming M.D.   On: 10/17/2018 19:00    EKG: Independently reviewed. Sinus tachycardia with rate of 115, new LAFB.  Assessment/Plan Active Problems:   Sepsis [facility])  Patient admitted with acute onset fever, abdominal pain nausea and 4 episodes of emesis.  No accompanying diarrhea.  Symptoms are most consistent with foodborne illness such as Staph or B cereus gastroenteritis, or viral gastroenteritis.  Patient meets sepsis criteria with fever, leukocytosis, tachycardia and elevated lactate, and in conjunction with Plaquenil use for mixed connective tissue disorder, it is prudent to be cautious in the early course of sepsis.  CXR, U/A and CT A/P are quite reassuring. Although the right shin is slightly erythematous as compared to the left, this is likely more representative of chronic venous stasis than cellulitis.  Severe sepsis secondary to gastroenteritis Lactic acidosis - Tentatively continue broad-spectrum antibiotics for now - Check RVP, follow up blood and urine cultures - Given lack of diarrhea, will hold off on GIP - Continue volume resuscitation with caution paid to known HFpEF - Trend lactate to close - Anti-emetics PRN for nausea - Can transfer from SDU in AM  Left anterior fascicular block -  New from prior EKG, suspect rate dependent - No other signs or symptoms suggestive of ACS - Can follow up with Cardiology as outpatient  DM2 - Pt on only short-acting sliding scale insulin at home - Start sliding scale; FSBS ACHS - Continue gabapentin per home regimen - Hold home metformin  Chronic medical conditions - CAD s/p PCI: continue ASA, beta blocker; not on home statin - MCTD: continue home Plaquenil - HTN: hold Lasix, enalapril in setting of volume depletion - Chronic pain: continue Oxycontin Q12H, oxycodone 10 mg TID PRN - Moderate persistent asthma: continue Breo ellipta - RLS:  continue pramipexole per home - Hypothyroidism: continue home Synthroid  DVT prophylaxis: Lovenox Code Status: Full Disposition Plan: Home in 2-3 days Consults called: None Admission status: SDU   Marcelo BaldyAdam Ross Schertz MD [facility]  If 7PM-7AM, please contact night-coverage [email or web address] Password [email or web address]  10/17/2018, 11:16 PM

## 2018-10-17 NOTE — ED Notes (Signed)
[facility] to receive report on pt.

## 2018-10-17 NOTE — ED Notes (Signed)
Pt took Oxycodone and Phenergan at 1400 today and insulin shot but has not taken any of her other meds or any fever reducers since sx's started at 0500 this am.

## 2018-10-17 NOTE — ED Notes (Signed)
Pt reports feeling some better at this time.

## 2018-10-17 NOTE — ED Notes (Signed)
Pt in NAD, RR even and unlabored. Call bell within reach. 

## 2018-10-17 NOTE — ED Triage Notes (Addendum)
N/V this morning. Also c/o cough and SOB with exertion

## 2018-10-17 NOTE — ED Notes (Signed)
Report given to [facility] at [facility]

## 2018-10-18 DIAGNOSIS — I872 Venous insufficiency (chronic) (peripheral): Secondary | ICD-10-CM

## 2018-10-18 DIAGNOSIS — Z888 Allergy status to other drugs, medicaments and biological substances status: Secondary | ICD-10-CM

## 2018-10-18 DIAGNOSIS — Z91048 Other nonmedicinal substance allergy status: Secondary | ICD-10-CM

## 2018-10-18 DIAGNOSIS — R112 Nausea with vomiting, unspecified: Secondary | ICD-10-CM

## 2018-10-18 DIAGNOSIS — B964 Proteus (mirabilis) (morganii) as the cause of diseases classified elsewhere: Secondary | ICD-10-CM

## 2018-10-18 DIAGNOSIS — Z91018 Allergy to other foods: Secondary | ICD-10-CM

## 2018-10-18 DIAGNOSIS — Z91013 Allergy to seafood: Secondary | ICD-10-CM

## 2018-10-18 DIAGNOSIS — B951 Streptococcus, group B, as the cause of diseases classified elsewhere: Secondary | ICD-10-CM

## 2018-10-18 DIAGNOSIS — R7881 Bacteremia: Secondary | ICD-10-CM

## 2018-10-18 DIAGNOSIS — Z87891 Personal history of nicotine dependence: Secondary | ICD-10-CM

## 2018-10-18 DIAGNOSIS — R1033 Periumbilical pain: Secondary | ICD-10-CM

## 2018-10-18 DIAGNOSIS — J45909 Unspecified asthma, uncomplicated: Secondary | ICD-10-CM

## 2018-10-18 DIAGNOSIS — Z872 Personal history of diseases of the skin and subcutaneous tissue: Secondary | ICD-10-CM

## 2018-10-18 DIAGNOSIS — R6889 Other general symptoms and signs: Secondary | ICD-10-CM

## 2018-10-18 DIAGNOSIS — B955 Unspecified streptococcus as the cause of diseases classified elsewhere: Secondary | ICD-10-CM | POA: Diagnosis present

## 2018-10-18 LAB — EXPECTORATED SPUTUM ASSESSMENT W GRAM STAIN, RFLX TO RESP C: Special Requests: NORMAL

## 2018-10-18 LAB — RESPIRATORY PANEL BY PCR

## 2018-10-18 LAB — URINALYSIS, ROUTINE W REFLEX MICROSCOPIC
Bacteria, UA: NONE SEEN
Bilirubin Urine: NEGATIVE
Glucose, UA: NEGATIVE mg/dL
Hgb urine dipstick: NEGATIVE
Ketones, ur: 5 mg/dL — AB
Leukocytes, UA: NEGATIVE
Nitrite: NEGATIVE
Protein, ur: NEGATIVE mg/dL
Specific Gravity, Urine: 1.028 (ref 1.005–1.030)
pH: 5 (ref 5.0–8.0)

## 2018-10-18 LAB — BLOOD CULTURE ID PANEL (REFLEXED)
Acinetobacter baumannii: NOT DETECTED
Candida albicans: NOT DETECTED
Candida glabrata: NOT DETECTED
Candida krusei: NOT DETECTED
Candida parapsilosis: NOT DETECTED
Candida tropicalis: NOT DETECTED
Carbapenem resistance: NOT DETECTED
Enterobacter cloacae complex: NOT DETECTED
Enterobacteriaceae species: DETECTED — AB
Enterococcus species: NOT DETECTED
Escherichia coli: NOT DETECTED
Haemophilus influenzae: NOT DETECTED
Klebsiella oxytoca: NOT DETECTED
Klebsiella pneumoniae: NOT DETECTED
Listeria monocytogenes: NOT DETECTED
Neisseria meningitidis: NOT DETECTED
Proteus species: DETECTED — AB
Pseudomonas aeruginosa: NOT DETECTED
Serratia marcescens: NOT DETECTED
Staphylococcus aureus (BCID): NOT DETECTED
Staphylococcus species: NOT DETECTED
Streptococcus agalactiae: DETECTED — AB
Streptococcus pneumoniae: NOT DETECTED
Streptococcus pyogenes: NOT DETECTED
Streptococcus species: DETECTED — AB

## 2018-10-18 LAB — CBC
HCT: 36.5 % (ref 36.0–46.0)
Hemoglobin: 11 g/dL — ABNORMAL LOW (ref 12.0–15.0)
MCH: 28.4 pg (ref 26.0–34.0)
MCHC: 30.1 g/dL (ref 30.0–36.0)
MCV: 94.1 fL (ref 80.0–100.0)
Platelets: 132 10*3/uL — ABNORMAL LOW (ref 150–400)
RBC: 3.88 MIL/uL (ref 3.87–5.11)
RDW: 14.7 % (ref 11.5–15.5)
WBC: 9.3 10*3/uL (ref 4.0–10.5)
nRBC: 0 % (ref 0.0–0.2)

## 2018-10-18 LAB — LACTIC ACID, PLASMA
Lactic Acid, Venous: 2.2 mmol/L (ref 0.5–1.9)
Lactic Acid, Venous: 2.8 mmol/L (ref 0.5–1.9)

## 2018-10-18 LAB — BASIC METABOLIC PANEL
Anion gap: 7 (ref 5–15)
BUN: 8 mg/dL (ref 6–20)
CO2: 23 mmol/L (ref 22–32)
Calcium: 7.9 mg/dL — ABNORMAL LOW (ref 8.9–10.3)
Chloride: 105 mmol/L (ref 98–111)
Creatinine, Ser: 0.91 mg/dL (ref 0.44–1.00)
GFR calc Af Amer: >60 mL/min (ref >60)
GFR calc non Af Amer: >60 mL/min (ref >60)
Glucose, Bld: 252 mg/dL — ABNORMAL HIGH (ref 70–99)
Potassium: 3.9 mmol/L (ref 3.5–5.1)
Sodium: 135 mmol/L (ref 135–145)

## 2018-10-18 LAB — GLUCOSE, CAPILLARY
Glucose-Capillary: 149 mg/dL — ABNORMAL HIGH (ref 70–99)
Glucose-Capillary: 220 mg/dL — ABNORMAL HIGH (ref 70–99)
Glucose-Capillary: 252 mg/dL — ABNORMAL HIGH (ref 70–99)
Glucose-Capillary: 213 mg/dL — ABNORMAL HIGH (ref 70–99)

## 2018-10-18 LAB — HIV ANTIBODY (ROUTINE TESTING W REFLEX): HIV Screen 4th Generation wRfx: NONREACTIVE

## 2018-10-18 LAB — PHOSPHORUS: Phosphorus: 3.2 mg/dL (ref 2.5–4.6)

## 2018-10-18 LAB — MAGNESIUM: Magnesium: 1.2 mg/dL — ABNORMAL LOW (ref 1.7–2.4)

## 2018-10-18 MED ORDER — SODIUM CHLORIDE 0.9 % IV SOLN
1.0000 g | INTRAVENOUS | Status: DC
  Administered 2018-10-18: 1 g via INTRAVENOUS
  Filled 2018-10-18: qty 1

## 2018-10-18 MED ORDER — ALBUTEROL SULFATE (2.5 MG/3ML) 0.083% IN NEBU
2.5000 mg | INHALATION_SOLUTION | Freq: Three times a day (TID) | RESPIRATORY_TRACT | Status: DC
  Administered 2018-10-18 – 2018-10-19 (×3): 2.5 mg via RESPIRATORY_TRACT
  Filled 2018-10-18 (×4): qty 3

## 2018-10-18 MED ORDER — INSULIN REGULAR HUMAN (CONC) 500 UNIT/ML ~~LOC~~ SOPN
5.0000 [IU] | PEN_INJECTOR | Freq: Three times a day (TID) | SUBCUTANEOUS | Status: DC
  Administered 2018-10-18: 70 [IU] via SUBCUTANEOUS
  Administered 2018-10-18 – 2018-10-19 (×2): 50 [IU] via SUBCUTANEOUS
  Administered 2018-10-19: 70 [IU] via SUBCUTANEOUS
  Administered 2018-10-19: 15 [IU] via SUBCUTANEOUS
  Administered 2018-10-19: 40 [IU] via SUBCUTANEOUS
  Administered 2018-10-20: 5 [IU] via SUBCUTANEOUS
  Administered 2018-10-20: 40 [IU] via SUBCUTANEOUS
  Administered 2018-10-20: 60 [IU] via SUBCUTANEOUS
  Administered 2018-10-20: 70 [IU] via SUBCUTANEOUS
  Administered 2018-10-21 (×2): 50 [IU] via SUBCUTANEOUS
  Filled 2018-10-18: qty 3

## 2018-10-18 MED ORDER — IPRATROPIUM-ALBUTEROL 0.5-2.5 (3) MG/3ML IN SOLN
3.0000 mL | Freq: Four times a day (QID) | RESPIRATORY_TRACT | Status: DC
  Administered 2018-10-18: 3 mL via RESPIRATORY_TRACT
  Filled 2018-10-18: qty 3

## 2018-10-18 MED ORDER — PRAMIPEXOLE DIHYDROCHLORIDE 0.25 MG PO TABS
0.5000 mg | ORAL_TABLET | Freq: Once | ORAL | Status: AC
  Administered 2018-10-18: 0.5 mg via ORAL
  Filled 2018-10-18: qty 2

## 2018-10-18 MED ORDER — SODIUM CHLORIDE 0.9 % IV SOLN
2.0000 g | INTRAVENOUS | Status: DC
  Administered 2018-10-19: 2 g via INTRAVENOUS
  Filled 2018-10-18: qty 20
  Filled 2018-10-18: qty 2

## 2018-10-18 MED ORDER — MAGNESIUM SULFATE 2 GM/50ML IV SOLN
2.0000 g | INTRAVENOUS | Status: AC
  Administered 2018-10-18 (×2): 2 g via INTRAVENOUS
  Filled 2018-10-18 (×2): qty 50

## 2018-10-18 NOTE — Consult Note (Signed)
[facility]    Date of Admission:  10/17/2018    Total days of antibiotics 2              Reason for Consult: Group B streptococcal and Proteus bacteremia    Referring Provider: Dr. Kathlen ModyVijaya Akula  Assessment: I suspect a skin source for her polymicrobial bacteremia, either her umbilical scab or lower extremities.  [name] is much better with empiric antibiotic therapy.  I will narrow treatment to ceftriaxone pending antibiotic susceptibilities.  I expect that [name] will continue to improve quickly and can be switched to oral antibiotic therapy soon.  I will follow-up tomorrow.  Plan: 1. Narrow antibiotic therapy to ceftriaxone pending final blood culture results  Active Problems:   Sepsis (HCC)   Gram-negative bacteremia   Streptococcal bacteremia   Venous stasis dermatitis of both lower extremities   Scheduled Meds: . aspirin EC  81 mg Oral Daily  . clopidogrel  75 mg Oral Q breakfast  . docusate sodium  100 mg Oral BID  . enoxaparin (LOVENOX) injection  40 mg Subcutaneous QHS  . fluticasone furoate-vilanterol  1 puff Inhalation Daily  . gabapentin  400 mg Oral 2 times per day  . gabapentin  700 mg Oral QHS  . hydroxychloroquine  200 mg Oral BID  . insulin aspart  0-20 Units Subcutaneous TID WC  . ipratropium-albuterol  3 mL Nebulization Q6H  . levothyroxine  200 mcg Oral Q0600  . metoprolol tartrate  25 mg Oral BID  . oxyCODONE  20 mg Oral q12n4p  . pantoprazole  40 mg Oral Daily  . pramipexole  0.5 mg Oral BID  . saccharomyces boulardii  250 mg Oral TID  . senna  1 tablet Oral BID  . vitamin B-12  1,000 mcg Oral Daily   Continuous Infusions: . cefTRIAXone (ROCEPHIN)  IV     PRN Meds:.acetaminophen **OR** acetaminophen, ondansetron **OR** ondansetron (ZOFRAN) IV, oxyCODONE, polyethylene glycol  HPI: [name] is a 58 y.o. female with morbid obesity and chronic venous stasis of her lower extremities.  [name] was recently treated for left  lower leg cellulitis with doxycycline from January 22-29.  [name] recalls that [name] developed sudden onset of small little blisters that eventually popped and scabbed over.  [name] feels like her cellulitis resolved.  More recently [name] developed a sore in her umbilicus.  It is been quite painful.  There is not been any drainage.  Yesterday morning [name] woke and had sudden onset of temperature to 104 degrees associated with severe nausea, vomiting and diffuse abdominal pain.  [name] has a history of asthma and noted slight worsening of her chronic cough yesterday.  [name] did not have any sputum production or shortness of breath.  [name] has not had any dysuria.  [name] was admitted with a temperature of 102.9 and started on empiric vancomycin and cefepime.  Her chest x-ray was of poor quality but did not show any obvious infiltrates.  CT scan of her abdomen and pelvis was unremarkable except for possible abdominal wall cellulitis.  [name] is feeling much better this morning.  1 of 2 admission blood cultures has grown group B strep and Proteus.   Review of Systems: Review of Systems  Constitutional: Positive for chills, diaphoresis, fever and malaise/fatigue.  HENT: Negative for nosebleeds and sore throat.   Respiratory: Positive for cough. Negative for sputum production, shortness of breath and wheezing.   Cardiovascular: Negative for chest  pain.  Gastrointestinal: Positive for abdominal pain, nausea and vomiting. Negative for diarrhea.  Genitourinary: Negative for dysuria.  Musculoskeletal: Positive for joint pain.  Skin: Negative for rash.       As noted in HPI.  Neurological: Negative for headaches.    Past Medical History:  Diagnosis Date  . Asthma    "on daily RX and rescue inhaler" (04/18/2018)  . Chronic diastolic CHF (congestive heart failure) (HCC) 09/2015  . Chronic lower back pain   . Chronic neck pain   . Chronic pain syndrome    Fentanyl Patch  . Colon polyps   . Coronary artery disease    cath with  normal LM, 30% LAD, 85% mid RCA and 95% distal RCA s/p PCI of the mid to distal RCA and now on DAPT with ASA and Ticagrelor.    . Eczema   . Excessive daytime sleepiness 11/26/2015  . Fibromyalgia   . Gallstones   . GERD (gastroesophageal reflux disease)   . Heart murmur    "noted for the 1st time on 04/18/2018"  . History of blood transfusion 07/2010   "S/P oophorectomy"  . History of gout   . History of hiatal hernia 1980s   "gone now" (04/18/2018)  . Hyperlipidemia   . Hypertension    takes Metoprolol and Enalapril daily  . Hypothyroidism    takes Synthroid daily  . IBS (irritable bowel syndrome)   . Migraine    "nothing in the 2000s" (04/18/2018)  . Mixed connective tissue disease (HCC)   . NAFLD (nonalcoholic fatty liver disease)   . Pneumonia    "several times" (04/18/2018)  . Rheumatoid arthritis (HCC)    "hands, elbows, shoulders, probably knees" (04/18/2018)  . Scoliosis   . Spondylosis   . Type II diabetes mellitus (HCC)    takes Metformin and Hum R daily (04/18/2018)    Social History   Tobacco Use  . Smoking status: Former Smoker    Packs/day: 1.00    Years: 20.00    Pack years: 20.00    Types: Cigarettes    Last attempt to quit: 02/11/2004    Years since quitting: 14.6  . Smokeless tobacco: Never Used  Substance Use Topics  . Alcohol use: Yes    Comment: 04/18/2018 "couple drinks/year"  . Drug use: Not Currently    Types: Marijuana    Comment: "only in my teens"    Family History  Problem Relation Age of Onset  . CAD Mother   . Hypertension Mother   . Heart attack Mother   . CAD Father   . Heart attack Father   . Allergic rhinitis Father   . Asthma Father   . Hypertension Brother   . Hypertension Brother   . Pancreatic cancer Paternal Aunt   . Breast cancer Paternal Aunt   . Lung cancer Paternal Aunt    Allergies  Allergen Reactions  . Fish Allergy Anaphylaxis    INCLUDES OMEGA 3 OILS  . Fish Oil Anaphylaxis and Swelling    THROAT SWELLS INCLUDES  FISH AS A CLASS  . Omega-3 Fatty Acids Swelling    Throat swelling  Throat swelling   . Other Anaphylaxis, Other (See Comments), Nausea And Vomiting and Shortness Of Breath    UNSPECIFIED REACTION  Perch Other reaction(s): Other (See Comments) INCLUDES OMEGA 3 OILS Perch UNSPECIFIED REACTION  Perch  . Crestor [Rosuvastatin] Other (See Comments)    Extreme joint pain, to this & other statins  . Gabapentin Anxiety  Anxiety on high doses  . Lovastatin Other (See Comments)    EXTREME JOINT PAIN  . Metronidazole Nausea Only and Swelling    Nausea and vomiting  Other reaction(s): Other (See Comments) Headache and shakes Nausea and vomiting  . Monascus Purpureus Went Yeast Other (See Comments)    Muscle pain and severe joint pain.   . Red Yeast Rice [Cholestin] Other (See Comments)    Muscle pain and severe joint pain.   . Rotigotine Swelling    Extreme edema   . Atrovent Hfa [Ipratropium Bromide Hfa] Other (See Comments)    wheezing  . Suprep [Na Sulfate-K Sulfate-Mg Sulf] Nausea And Vomiting  . Tape Other (See Comments)    Electrodes causes skin breakdown    OBJECTIVE: Blood pressure (!) 117/57, pulse 91, temperature 98.5 F (36.9 C), temperature source Oral, resp. rate 15, height  (1.549 m), weight (!) 144.2 kg, SpO2 97 %.  Physical Exam Constitutional:      Comments: [name] is alert and in no distress sitting up in a chair.  Cardiovascular:     Rate and Rhythm: Normal rate and regular rhythm.     Heart sounds: No murmur.  Pulmonary:     Effort: Pulmonary effort is normal.     Breath sounds: Normal breath sounds.  Abdominal:     Palpations: Abdomen is soft.     Tenderness: There is no abdominal tenderness.     Comments: [name] has a large abdominal pannus.  [name] has healed lower midline incisions.  There is no obvious abdominal wall cellulitis but [name] does have a scabbed area in her umbilicus.  There is no surrounding erythema or fluctuance.  There is no drainage or  odor.  The area is tender to palpation.  Skin:    Comments: [name] has 1+ pitting edema of both lower legs with venous stasis dermatitis.  There is no obvious acute cellulitis although [name] does have a few small ulcerated areas, most with scabs.  Psychiatric:        Mood and Affect: Mood normal.     Lab Results Lab Results  Component Value Date   WBC 9.3 10/18/2018   HGB 11.0 (L) 10/18/2018   HCT 36.5 10/18/2018   MCV 94.1 10/18/2018   PLT 132 (L) 10/18/2018    Lab Results  Component Value Date   CREATININE 0.91 10/18/2018   BUN 8 10/18/2018   NA 135 10/18/2018   K 3.9 10/18/2018   CL 105 10/18/2018   CO2 23 10/18/2018    Lab Results  Component Value Date   ALT 42 10/17/2018   AST 49 (H) 10/17/2018   ALKPHOS 46 10/17/2018   BILITOT 0.6 10/17/2018     Microbiology: Recent Results (from the past 240 hour(s))  Blood Culture (routine x 2)     Status: None (Preliminary result)   Collection Time: 10/17/18  3:34 PM  Result Value Ref Range Status   Specimen Description   Final    BLOOD LEFT ANTECUBITAL Performed at [facility], [address] [place], [place] [address]    Special Requests   Final    BOTTLES DRAWN AEROBIC AND ANAEROBIC Blood Culture adequate volume Performed at [facility], [address] [place], [place] [address]    Culture  Setup Time   Final    IN BOTH AEROBIC AND ANAEROBIC BOTTLES Organism ID to follow GRAM POSITIVE COCCI IN CHAINS GRAM NEGATIVE RODS CRITICAL RESULT CALLED TO, READ BACK  BY AND VERIFIED WITH: Thana Ates [id number] MLM Performed at [facility], [address] [place], [place] [address]    Culture GRAM POSITIVE COCCI GRAM NEGATIVE RODS   Final   Report Status PENDING  Incomplete  Group A Strep by PCR     Status: None   Collection Time: 10/17/18  3:34 PM  Result Value Ref Range Status   Group A Strep by PCR NOT DETECTED NOT DETECTED Final    Comment: Performed at [facility], [address] [place], [place] [address]  Blood Culture ID Panel (Reflexed)     Status: Abnormal   Collection Time: 10/17/18  3:34 PM  Result Value Ref Range Status   Enterococcus species NOT DETECTED NOT DETECTED Final   Listeria monocytogenes NOT DETECTED NOT DETECTED Final   Staphylococcus species NOT DETECTED NOT DETECTED Final   Staphylococcus aureus (BCID) NOT DETECTED NOT DETECTED Final   Streptococcus species DETECTED (A) NOT DETECTED Final    Comment: CRITICAL RESULT CALLED TO, READ BACK BY AND VERIFIED WITH: PHARMD J GADHIA [id number] MLM    Streptococcus agalactiae DETECTED (A) NOT DETECTED Final    Comment: CRITICAL RESULT CALLED TO, READ BACK BY AND VERIFIED WITH: PHARMD J GADHIA [id number] MLM    Streptococcus pneumoniae NOT DETECTED NOT DETECTED Final   Streptococcus pyogenes NOT DETECTED NOT DETECTED Final   Acinetobacter baumannii NOT DETECTED NOT DETECTED Final   Enterobacteriaceae species DETECTED (A) NOT DETECTED Final    Comment: Enterobacteriaceae represent a large family of gram-negative bacteria, not a single organism. CRITICAL RESULT CALLED TO, READ BACK BY AND VERIFIED WITH: PHARMD J GADHIA [id number] MLM    Enterobacter cloacae complex NOT DETECTED NOT DETECTED Final   Escherichia coli NOT DETECTED NOT DETECTED Final   Klebsiella oxytoca NOT DETECTED NOT DETECTED Final   Klebsiella pneumoniae NOT DETECTED NOT DETECTED Final   Proteus species DETECTED (A) NOT DETECTED Final    Comment: CRITICAL RESULT CALLED TO, READ BACK BY AND VERIFIED WITH: PHARMD J GADHIA [id number] MLM    Serratia marcescens NOT DETECTED NOT DETECTED Final   Carbapenem resistance NOT DETECTED NOT DETECTED Final   Haemophilus influenzae NOT DETECTED NOT DETECTED Final   Neisseria meningitidis NOT DETECTED NOT DETECTED Final   Pseudomonas aeruginosa NOT DETECTED NOT DETECTED Final   Candida albicans NOT DETECTED NOT DETECTED Final   Candida glabrata NOT DETECTED NOT DETECTED Final    Candida krusei NOT DETECTED NOT DETECTED Final   Candida parapsilosis NOT DETECTED NOT DETECTED Final   Candida tropicalis NOT DETECTED NOT DETECTED Final    Comment: Performed at [facility], [address] [place], [place] [address]  Blood Culture (routine x 2)     Status: None (Preliminary result)   Collection Time: 10/17/18  3:55 PM  Result Value Ref Range Status   Specimen Description   Final    BLOOD RIGHT ANTECUBITAL Performed at [facility], [address] [place], [place] [address]    Special Requests   Final    BOTTLES DRAWN AEROBIC ONLY Blood Culture adequate volume Performed at [facility], [address] [place], [place] [address]    Culture   Final    NO GROWTH < 12 HOURS Performed at [facility], [address], [place], [place] [address]    Report Status PENDING  Incomplete  MRSA PCR Screening     Status:  None   Collection Time: 10/17/18 10:23 PM  Result Value Ref Range Status   MRSA by PCR NEGATIVE NEGATIVE Final    Comment:        The GeneXpert MRSA Assay (FDA approved for NASAL specimens only), is one component of a comprehensive MRSA colonization surveillance program. It is not intended to diagnose MRSA infection nor to guide or monitor treatment for MRSA infections. Performed at [facility], [address] [place], [place] [address]   Respiratory Panel by PCR     Status: Abnormal   Collection Time: 10/18/18  1:01 AM  Result Value Ref Range Status   Adenovirus NOT DETECTED NOT DETECTED Final   Coronavirus 229E NOT DETECTED NOT DETECTED Final    Comment: (NOTE) The Coronavirus on the Respiratory Panel, DOES NOT test for the novel  Coronavirus (2019 nCoV)    Coronavirus HKU1 NOT DETECTED NOT DETECTED Final   Coronavirus NL63 NOT DETECTED NOT DETECTED Final   Coronavirus OC43 NOT DETECTED NOT DETECTED Final   Metapneumovirus NOT DETECTED NOT DETECTED Final   Rhinovirus / Enterovirus  DETECTED (A) NOT DETECTED Final   Influenza A NOT DETECTED NOT DETECTED Final   Influenza B NOT DETECTED NOT DETECTED Final   Parainfluenza Virus 1 NOT DETECTED NOT DETECTED Final   Parainfluenza Virus 2 NOT DETECTED NOT DETECTED Final   Parainfluenza Virus 3 NOT DETECTED NOT DETECTED Final   Parainfluenza Virus 4 NOT DETECTED NOT DETECTED Final   Respiratory Syncytial Virus NOT DETECTED NOT DETECTED Final   Bordetella pertussis NOT DETECTED NOT DETECTED Final   Chlamydophila pneumoniae NOT DETECTED NOT DETECTED Final   Mycoplasma pneumoniae NOT DETECTED NOT DETECTED Final    Comment: Performed at [facility], [address] [place], [place] [address]    Cliffton AstersJohn Toiya Morrish, MD [facility] [phone] pager   [phone] cell 10/18/2018, 11:46 AM

## 2018-10-18 NOTE — Progress Notes (Signed)
PHARMACY NOTE:  ANTIMICROBIAL RENAL DOSAGE ADJUSTMENT  Current antimicrobial regimen includes a mismatch between antimicrobial dosage and estimated renal function.  As per policy approved by the Pharmacy & Therapeutics and Medical Executive Committees, the antimicrobial dosage will be adjusted accordingly.  Current antimicrobial dosage:  Ceftriaxone 1 gm q 24 hours  Indication: Bacteremia  Renal Function:  Estimated Creatinine Clearance: 93 mL/min (by C-G formula based on SCr of 0.91 mg/dL). []      On intermittent HD, scheduled: []      On CRRT    Antimicrobial dosage has been changed to:  Ceftriaxone 2 gm every 24 hours   Additional comments: Increasing to 2 gm to cover bacteremia.    Thank you for allowing pharmacy to be a part of this patient's care.  Sharin Mons, PharmD, BCPS, BCIDP Infectious Diseases Clinical Pharmacist Phone: [phone] 10/18/2018 2:38 PM

## 2018-10-18 NOTE — Progress Notes (Signed)
[facility]  [facility] Infusion Coordinator will follow pt with ID team to support home infusion pharmacy services at [place] as ordered.  If patient discharges after hours, please call [phone].   [name] 10/18/2018, 10:56 AM

## 2018-10-18 NOTE — Progress Notes (Addendum)
PHARMACY - PHYSICIAN COMMUNICATION CRITICAL VALUE ALERT - BLOOD CULTURE IDENTIFICATION (BCID)  [name] is an 58 y.o. female who [facility] to [facility]17[facility]2018 with a chief complaint of cough, emesis  Assessment:  GBS and Proteus in blood (possibly cellulitis source), also noted +rhinovirus/enterovirus on resp panel  Name of physician (or Provider) ContactedBlake Divine: Akula  Current antibiotics: Vanc + Cefepime  Changes to prescribed antibiotics recommended: would narrow to Rocephin - Provider declined; contacting ID; wants to continue current abx until then  Results for orders placed or performed during the hospital encounter of 10/17/18  Blood Culture ID Panel (Reflexed) (Collected: 10/17/2018  3:34 PM)  Result Value Ref Range   Enterococcus species NOT DETECTED NOT DETECTED   Listeria monocytogenes NOT DETECTED NOT DETECTED   Staphylococcus species NOT DETECTED NOT DETECTED   Staphylococcus aureus (BCID) NOT DETECTED NOT DETECTED   Streptococcus species DETECTED (A) NOT DETECTED   Streptococcus agalactiae DETECTED (A) NOT DETECTED   Streptococcus pneumoniae NOT DETECTED NOT DETECTED   Streptococcus pyogenes NOT DETECTED NOT DETECTED   Acinetobacter baumannii NOT DETECTED NOT DETECTED   Enterobacteriaceae species DETECTED (A) NOT DETECTED   Enterobacter cloacae complex NOT DETECTED NOT DETECTED   Escherichia coli NOT DETECTED NOT DETECTED   Klebsiella oxytoca NOT DETECTED NOT DETECTED   Klebsiella pneumoniae NOT DETECTED NOT DETECTED   Proteus species DETECTED (A) NOT DETECTED   Serratia marcescens NOT DETECTED NOT DETECTED   Carbapenem resistance NOT DETECTED NOT DETECTED   Haemophilus influenzae NOT DETECTED NOT DETECTED   Neisseria meningitidis NOT DETECTED NOT DETECTED   Pseudomonas aeruginosa NOT DETECTED NOT DETECTED   Candida albicans NOT DETECTED NOT DETECTED   Candida glabrata NOT DETECTED NOT DETECTED   Candida krusei NOT DETECTED NOT DETECTED   Candida  parapsilosis NOT DETECTED NOT DETECTED   Candida tropicalis NOT DETECTED NOT DETECTED    Hammond Obeirne A 10/18/2018  8:48 AM

## 2018-10-18 NOTE — Progress Notes (Signed)
PROGRESS NOTE    [name]  ZOX:[id number] DOB: 12-06-1960 DOA: 10/17/2018 PCP: Sharlene DoryWendling, Nicholas Paul, DO   Brief Narrative: [name] is a 58 y.o. female with medical history significant for CAD status post PCI, heart failure with preserved ejection fraction, hypertension, mixed connective tissue disorder, moderate persistent asthma, morbid obesity, IBS, chronic pain, type 2 diabetes mellitus with diabetic neuropathy, hypothyroidism and restless leg syndrome who presented to the [facility] with 1 day of fever, periumbilical abdominal pain, nausea and 4 episodes of nonbloody, nonbilious emesis.  Patient reports that she was in her usual state of health until waking up in the morning, at which time she felt quite nauseated and had multiple episodes of vomiting.  She endorses chronic cough occasionally productive of yellow sputum that is largely unchanged from baseline and denies shortness of breath above her baseline.  She was recently treated with 1 week of doxycycline for left lower extremity cellulitis from 1/22 through 1/29, with subsequent improvement in the appearance of that leg.  She feels much improved after initial resuscitation in the emergency department.   CT abdomen pelvis with contrast was obtained demonstrating no acute intra-abdominal process, normal-appearing lung bases and subcutaneous stranding of the pannus possibly consistent with cellulitis.   Assessment & Plan:   Active Problems:   Sepsis (HCC)  Sepsis probably secondary to abdominal wall cellulitis/gastroenteritis/Streptococcus and Proteus bacteremia Improving. Fever has subsided, lactic acid improving.  Blood pressure parameters are better. Trend lactic acid. Get pro calcitonin level.   Patient was started on IV vancomycin and cefepime on admission.   Continue the same for now,  wait for sensitivities. ID consulted for recommendations. Urine analysis negative for infection. CT abdomen and  pelvis shows some stranding in the subcutaneous tissue suspicious for cellulitis. Patient currently denies any nausea and vomiting but has persistent abdominal discomfort and pain.  She denies any diarrhea at this time.      History of coronary artery disease and chronic diastolic heart failure She denies any chest pain at this time continue with home aspirin, Plavix. Watch for fluid overload with her history of chronic diastolic heart failure.   Hypothyroidism Continue with Synthroid.   Hypomagnesemia replace as needed and repeat level tomorrow.    Type 2 diabetes mellitus with hyperglycemia Resume home dose of Humulin 500.  Continue with sliding scale insulin.  Holding metformin at this time due to elevated lactic acid.    Hypertension Holding Lasix and enalapril in the setting of volume depletion. Blood pressure parameters are wNL.    Mixed connective tissue disorder Continue with Plaquenil    History of chronic pain syndrome Continue with oxycontin 10 every 12 hours and oxycodone 10 mg 3 times daily as needed.   Moderate persistent asthma Resume home treatments.   Rhinovirus positive on respiratory panel Symptomatic management.        DVT prophylaxis:  Lovenox.  Code Status: full code.  Family Communication: family at bedside. .  Disposition Plan:pending clinical improvement, possible transfer to floor today.   Consultants:   ID consult Dr Orvan Falconerampbell.   Procedures:None.   Antimicrobials: Vancomycin and zosyn since admission.   Subjective: Reports feeling better than yesterday.   Objective: Vitals:   10/18/18 0300 10/18/18 0400 10/18/18 0447 10/18/18 0600  BP: (!) 141/54 (!) 142/59  (!) 131/58  Pulse: 91 89  89  Resp: 18 18  19   Temp:      TempSrc:      SpO2: 94%  97%  93%  Weight:   (!) 144.2 kg   Height:        Intake/Output Summary (Last 24 hours) at 10/18/2018 0829 Last data filed at 10/18/2018 0500 Gross per 24 hour  Intake 334.88  ml  Output 3 ml  Net 331.88 ml   Filed Weights   10/17/18 1501 10/17/18 2232 10/18/18 0447  Weight: (!) 144 kg (!) 144.2 kg (!) 144.2 kg    Examination:  General exam: Appears calm and comfortable  Respiratory system: Clear to auscultation. Respiratory effort normal. Cardiovascular system: S1 & S2 heard, RRR. No JVD, murmurs, rubs, gallops or clicks. No pedal edema. Gastrointestinal system: Abdomen is nondistended, soft and nontender. No organomegaly or masses felt. Normal bowel sounds heard. Central nervous system: Alert and oriented. No focal neurological deficits. Extremities: Symmetric 5 x 5 power. Skin: No rashes, lesions or ulcers Psychiatry: Judgement and insight appear normal. Mood & affect appropriate.     Data Reviewed: I have personally reviewed following labs and imaging studies  CBC: Recent Labs  Lab 10/17/18 1534  WBC 15.5*  NEUTROABS 13.7*  HGB 13.2  HCT 42.8  MCV 89.9  PLT 179   Basic Metabolic Panel: Recent Labs  Lab 10/17/18 1534 10/17/18 2345  NA 137  --   K 4.2  --   CL 101  --   CO2 25  --   GLUCOSE 231*  --   BUN 11  --   CREATININE 1.06*  --   CALCIUM 9.2  --   MG  --  1.2*  PHOS  --  3.2   GFR: Estimated Creatinine Clearance: 79.9 mL/min (A) (by C-G formula based on SCr of 1.06 mg/dL (H)). Liver Function Tests: Recent Labs  Lab 10/17/18 1534  AST 49*  ALT 42  ALKPHOS 46  BILITOT 0.6  PROT 6.7  ALBUMIN 3.5   Recent Labs  Lab 10/17/18 1534  LIPASE 33   No results for input(s): AMMONIA in the last 168 hours. Coagulation Profile: Recent Labs  Lab 10/17/18 1534  INR 1.07   Cardiac Enzymes: No results for input(s): CKTOTAL, CKMB, CKMBINDEX, TROPONINI in the last 168 hours. BNP (last 3 results) No results for input(s): PROBNP in the last 8760 hours. HbA1C: No results for input(s): HGBA1C in the last 72 hours. CBG: Recent Labs  Lab 10/18/18 0747  GLUCAP 149*   Lipid Profile: No results for input(s): CHOL, HDL,  LDLCALC, TRIG, CHOLHDL, LDLDIRECT in the last 72 hours. Thyroid Function Tests: No results for input(s): TSH, T4TOTAL, FREET4, T3FREE, THYROIDAB in the last 72 hours. Anemia Panel: No results for input(s): VITAMINB12, FOLATE, FERRITIN, TIBC, IRON, RETICCTPCT in the last 72 hours. Sepsis Labs: Recent Labs  Lab 10/17/18 1534 10/17/18 1810 10/17/18 2345  LATICACIDVEN 3.9* 4.5* 2.8*    Recent Results (from the past 240 hour(s))  Blood Culture (routine x 2)     Status: None (Preliminary result)   Collection Time: 10/17/18  3:34 PM  Result Value Ref Range Status   Specimen Description   Final    BLOOD LEFT ANTECUBITAL Performed at [facility], [address] [place], [place] [address]    Special Requests   Final    BOTTLES DRAWN AEROBIC AND ANAEROBIC Blood Culture adequate volume Performed at [facility], [address] [place], [place] [address]    Culture  Setup Time   Final    IN BOTH AEROBIC AND ANAEROBIC BOTTLES Organism ID to follow GRAM  POSITIVE COCCI Performed at [facility], [address] [place], [place] [address]    Culture GRAM POSITIVE COCCI  Final   Report Status PENDING  Incomplete  Group A Strep by PCR     Status: None   Collection Time: 10/17/18  3:34 PM  Result Value Ref Range Status   Group A Strep by PCR NOT DETECTED NOT DETECTED Final    Comment: Performed at [facility], [address] [place], [place] [address]  Blood Culture (routine x 2)     Status: None (Preliminary result)   Collection Time: 10/17/18  3:55 PM  Result Value Ref Range Status   Specimen Description   Final    BLOOD RIGHT ANTECUBITAL Performed at [facility], [address] [place], [place] [address]    Special Requests   Final    BOTTLES DRAWN AEROBIC ONLY Blood Culture adequate volume Performed at [facility], [address] [place], [place] [address]    Culture   Final    NO GROWTH < 12 HOURS Performed at  [facility], [address] [place], [place] [address]    Report Status PENDING  Incomplete  MRSA PCR Screening     Status: None   Collection Time: 10/17/18 10:23 PM  Result Value Ref Range Status   MRSA by PCR NEGATIVE NEGATIVE Final    Comment:        The GeneXpert MRSA Assay (FDA approved for NASAL specimens only), is one component of a comprehensive MRSA colonization surveillance program. It is not intended to diagnose MRSA infection nor to guide or monitor treatment for MRSA infections. Performed at [facility], [address], [place], [place] [address]   Respiratory Panel by PCR     Status: Abnormal   Collection Time: 10/18/18  1:01 AM  Result Value Ref Range Status   Adenovirus NOT DETECTED NOT DETECTED Final   Coronavirus 229E NOT DETECTED NOT DETECTED Final    Comment: (NOTE) The Coronavirus on the Respiratory Panel, DOES NOT test for the novel  Coronavirus (2019 nCoV)    Coronavirus HKU1 NOT DETECTED NOT DETECTED Final   Coronavirus NL63 NOT DETECTED NOT DETECTED Final   Coronavirus OC43 NOT DETECTED NOT DETECTED Final   Metapneumovirus NOT DETECTED NOT DETECTED Final   Rhinovirus / Enterovirus DETECTED (A) NOT DETECTED Final   Influenza A NOT DETECTED NOT DETECTED Final   Influenza B NOT DETECTED NOT DETECTED Final   Parainfluenza Virus 1 NOT DETECTED NOT DETECTED Final   Parainfluenza Virus 2 NOT DETECTED NOT DETECTED Final   Parainfluenza Virus 3 NOT DETECTED NOT DETECTED Final   Parainfluenza Virus 4 NOT DETECTED NOT DETECTED Final   Respiratory Syncytial Virus NOT DETECTED NOT DETECTED Final   Bordetella pertussis NOT DETECTED NOT DETECTED Final   Chlamydophila pneumoniae NOT DETECTED NOT DETECTED Final   Mycoplasma pneumoniae NOT DETECTED NOT DETECTED Final    Comment: Performed at [facility], [address], [place], [place] [address]         Radiology Studies: Dg Chest 2 View  Result Date: 10/17/2018 CLINICAL  DATA:  Cough, shortness of breath. EXAM: CHEST - 2 VIEW COMPARISON:  01/03/2018 FINDINGS: Rotated to the LEFT. Normal heart size and mediastinal contours. Azygos fissure noted. No definite infiltrate, pleural effusion or pneumothorax. Hazy opacity of the RIGHT hemithorax versus LEFT may be related to asymmetric superimposed soft tissues, without definite pleural  effusion on lateral view. Bones demineralized. IMPRESSION: No definite acute abnormalities. Electronically Signed   By: Ulyses Southward M.D.   On: 10/17/2018 16:47   Ct Abdomen Pelvis W Contrast  Result Date: 10/17/2018 CLINICAL DATA:  58 year old female with abdominal pain and distension. Fever. EXAM: CT ABDOMEN AND PELVIS WITH CONTRAST TECHNIQUE: Multidetector CT imaging of the abdomen and pelvis was performed using the standard protocol following bolus administration of intravenous contrast. CONTRAST:  ISOVUE-300 IOPAMIDOL (ISOVUE-300) INJECTION 61% COMPARISON:  CT Abdomen and Pelvis 12/06/2008. FINDINGS: Lower chest: Stable, negative lung bases. Minimal chronic atelectasis or scarring in both middle lobes. No pericardial or pleural effusion. Hepatobiliary: Surgically absent gallbladder. Hepatic steatosis. No bile duct enlargement. Pancreas: Negative. Spleen: Negative. Adrenals/Urinary Tract: Normal adrenal glands. Bilateral renal enhancement and contrast excretion is symmetric and normal. Decompressed proximal ureters. Diminutive and unremarkable urinary bladder. Stomach/Bowel: Negative rectosigmoid colon. The sigmoid is mildly redundant. Negative descending colon. Mildly redundant but negative transverse colon. Negative right colon and terminal ileum. Appendix is diminutive or absent. No large bowel inflammation. No dilated small bowel. Rectus muscle diastasis but no abdominal hernia identified. No small bowel mesenteric stranding. Negative stomach and duodenum. No free air, free fluid. Vascular/Lymphatic: Aortoiliac calcified atherosclerosis.  Suboptimal intravascular contrast bolus but the major arterial structures appear to remain patent. The portal venous system appears patent. No lymphadenopathy. Reproductive: Surgically absent uterus. Diminutive or absent ovaries. Other: Large body habitus. No pelvic free fluid. Partially visible abdominal panniculus with asymmetric subcutaneous stranding and some dystrophic calcifications (series 3, images 43 and 56. Musculoskeletal: Advanced lumbar disc, endplate and facet degeneration with progression since 2010. No acute osseous abnormality identified. IMPRESSION: 1. No acute or inflammatory process identified in the abdomen or pelvis. 2. Partially visible subcutaneous stranding of the left abdominal panniculus. Query Cellulitis. 3. Hepatic steatosis. 4.  Aortic Atherosclerosis (ICD10-I70.0). Electronically Signed   By: Odessa Fleming M.D.   On: 10/17/2018 19:00        Scheduled Meds: . aspirin EC  81 mg Oral Daily  . clopidogrel  75 mg Oral Q breakfast  . docusate sodium  100 mg Oral BID  . enoxaparin (LOVENOX) injection  40 mg Subcutaneous QHS  . fluticasone furoate-vilanterol  1 puff Inhalation Daily  . gabapentin  400 mg Oral 2 times per day  . gabapentin  700 mg Oral QHS  . hydroxychloroquine  200 mg Oral BID  . insulin aspart  0-20 Units Subcutaneous TID WC  . levothyroxine  200 mcg Oral Q0600  . metoprolol tartrate  25 mg Oral BID  . oxyCODONE  20 mg Oral q12n4p  . pantoprazole  40 mg Oral Daily  . pramipexole  0.5 mg Oral BID  . saccharomyces boulardii  250 mg Oral TID  . senna  1 tablet Oral BID  . vitamin B-12  1,000 mcg Oral Daily   Continuous Infusions: . ceFEPime (MAXIPIME) IV Stopped (10/18/18 0442)  . vancomycin 750 mg (10/18/18 0813)     LOS: 1 day    Time spent: 36 minutes    Kathlen Mody, MD [facility] Pager [phone] If 7PM-7AM, please contact night-coverage [email or web address] Password [email or web address] 10/18/2018, 8:29 AM

## 2018-10-18 NOTE — Progress Notes (Signed)
CRITICAL VALUE ALERT  Critical Value: Lactic Acid 2.2  Date & Time Notied: 10/18/2018 1120  Provider Notified: Dr.Akula  Orders Received/Actions taken: Made aware

## 2018-10-18 NOTE — Progress Notes (Signed)
CRITICAL VALUE ALERT  Critical Value:  Lactic Acid 2.8   Date & Time Notied:  10/18/2018 0024  Provider Notified: A.Schertz  Orders Received/Actions taken: No new orders at this time.

## 2018-10-19 DIAGNOSIS — B955 Unspecified streptococcus as the cause of diseases classified elsewhere: Secondary | ICD-10-CM

## 2018-10-19 DIAGNOSIS — L03119 Cellulitis of unspecified part of limb: Secondary | ICD-10-CM

## 2018-10-19 DIAGNOSIS — R509 Fever, unspecified: Secondary | ICD-10-CM

## 2018-10-19 DIAGNOSIS — L03115 Cellulitis of right lower limb: Secondary | ICD-10-CM

## 2018-10-19 LAB — COMPREHENSIVE METABOLIC PANEL
ALT: 33 U/L (ref 0–44)
AST: 39 U/L (ref 15–41)
Albumin: 3.3 g/dL — ABNORMAL LOW (ref 3.5–5.0)
Alkaline Phosphatase: 43 U/L (ref 38–126)
Anion gap: 9 (ref 5–15)
BUN: 9 mg/dL (ref 6–20)
CO2: 22 mmol/L (ref 22–32)
Calcium: 8.2 mg/dL — ABNORMAL LOW (ref 8.9–10.3)
Chloride: 105 mmol/L (ref 98–111)
Creatinine, Ser: 0.93 mg/dL (ref 0.44–1.00)
GFR calc Af Amer: >60 mL/min (ref >60)
GFR calc non Af Amer: >60 mL/min (ref >60)
Glucose, Bld: 199 mg/dL — ABNORMAL HIGH (ref 70–99)
Potassium: 4.1 mmol/L (ref 3.5–5.1)
Sodium: 136 mmol/L (ref 135–145)
Total Bilirubin: 0.6 mg/dL (ref 0.3–1.2)
Total Protein: 6.3 g/dL — ABNORMAL LOW (ref 6.5–8.1)

## 2018-10-19 LAB — GLUCOSE, CAPILLARY
Glucose-Capillary: 186 mg/dL — ABNORMAL HIGH (ref 70–99)
Glucose-Capillary: 262 mg/dL — ABNORMAL HIGH (ref 70–99)
Glucose-Capillary: 142 mg/dL — ABNORMAL HIGH (ref 70–99)
Glucose-Capillary: 143 mg/dL — ABNORMAL HIGH (ref 70–99)

## 2018-10-19 LAB — MAGNESIUM: Magnesium: 2.2 mg/dL (ref 1.7–2.4)

## 2018-10-19 MED ORDER — METOPROLOL TARTRATE 5 MG/5ML IV SOLN
5.0000 mg | INTRAVENOUS | Status: AC | PRN
  Administered 2018-10-19 (×2): 5 mg via INTRAVENOUS
  Filled 2018-10-19: qty 5

## 2018-10-19 MED ORDER — IPRATROPIUM-ALBUTEROL 0.5-2.5 (3) MG/3ML IN SOLN
3.0000 mL | Freq: Three times a day (TID) | RESPIRATORY_TRACT | Status: DC
  Administered 2018-10-19 – 2018-10-21 (×6): 3 mL via RESPIRATORY_TRACT
  Filled 2018-10-19 (×5): qty 3

## 2018-10-19 MED ORDER — DM-GUAIFENESIN ER 30-600 MG PO TB12
1.0000 | ORAL_TABLET | Freq: Two times a day (BID) | ORAL | Status: DC
  Administered 2018-10-19 – 2018-10-21 (×5): 1 via ORAL
  Filled 2018-10-19 (×5): qty 1

## 2018-10-19 MED ORDER — FUROSEMIDE 10 MG/ML IJ SOLN
40.0000 mg | Freq: Once | INTRAMUSCULAR | Status: AC
  Administered 2018-10-19: 40 mg via INTRAVENOUS
  Filled 2018-10-19: qty 4

## 2018-10-19 MED ORDER — SODIUM CHLORIDE 0.9 % IV BOLUS
500.0000 mL | Freq: Once | INTRAVENOUS | Status: AC
  Administered 2018-10-19: 500 mL via INTRAVENOUS

## 2018-10-19 MED ORDER — METOPROLOL TARTRATE 25 MG PO TABS
37.5000 mg | ORAL_TABLET | Freq: Two times a day (BID) | ORAL | Status: DC
  Administered 2018-10-19 – 2018-10-21 (×4): 37.5 mg via ORAL
  Filled 2018-10-19 (×4): qty 2

## 2018-10-19 MED ORDER — PRAMIPEXOLE DIHYDROCHLORIDE 1 MG PO TABS
1.5000 mg | ORAL_TABLET | Freq: Two times a day (BID) | ORAL | Status: DC
  Administered 2018-10-19 – 2018-10-21 (×4): 1.5 mg via ORAL
  Filled 2018-10-19 (×5): qty 2

## 2018-10-19 MED ORDER — DILTIAZEM HCL 25 MG/5ML IV SOLN
10.0000 mg | Freq: Once | INTRAVENOUS | Status: AC
  Administered 2018-10-19: 10 mg via INTRAVENOUS
  Filled 2018-10-19 (×2): qty 5

## 2018-10-19 MED ORDER — IPRATROPIUM-ALBUTEROL 0.5-2.5 (3) MG/3ML IN SOLN
RESPIRATORY_TRACT | Status: AC
  Filled 2018-10-19: qty 3

## 2018-10-19 NOTE — Progress Notes (Signed)
Patient's HR continues to be in the 120's-130's. On-call provider paged and order received to give 10 mg Cardizem IV. Will continue to monitor and follow-up with provider as needed.

## 2018-10-19 NOTE — Progress Notes (Signed)
This RN received a call from central telemetry informing her that the patient was in Afib with a HR in the 140's. Patient checked and found to be sleeping. Mild report of pain in the LE's. Temp 101.1, BP 140/115, HR 124, RR 20, sats 90% on 2 L. Call placed to on-call provider and orders received to give 500 cc bolus and metoprolol 5 mg IV Q5 min for sustained HR > 120.

## 2018-10-19 NOTE — Progress Notes (Signed)
PROGRESS NOTE  [name]  MVH:[id number] DOB: 10/06/1960 DOA: 10/17/2018 PCP: Sharlene Dory, DO   Brief Narrative: [name] a 58 y.o.femalewith medical history significant forCAD status post PCI, heart failure with preserved ejection fraction, hypertension, mixed connective tissue disorder, moderate persistent asthma, morbid obesity, IBS, chronic pain, type 2 diabetes mellitus with diabetic neuropathy, hypothyroidism and restless leg syndrome who presented to the [facility] ED with 1 day of fever, periumbilical abdominal pain, nausea and 4 episodes of nonbloody, nonbilious emesis. Patient reports that she was in her usual state of health until waking up in the morning, at which time she felt quite nauseated and had multiple episodes of vomiting.  She endorses chronic cough occasionally productive of yellow sputum that is largely unchanged from baseline and denies shortness of breath above her baseline. She was recently treated with 1 week of doxycycline for left lower extremity cellulitis from 1/22 through 1/29, with subsequent improvement in the appearance of that leg. She feels much improved after initial resuscitation in the emergency department.  CT abdomen pelvis with contrast was obtained demonstrating no acute intra-abdominal process, normal-appearing lung bases and subcutaneous stranding of the pannus possibly consistent with cellulitis.   Blood cultures have grown Proteus and group B streptococcus. ID consulted and antibiotics narrowed to ceftriaxone pending final cultures and susceptibilities. Respiratory virus panel has also revealed rhinoviral infection. She continues to be hypoxic and developed AFib with RVR overnight into 2/8.   Assessment & Plan: Principal Problem:   Recurrent cellulitis of lower extremity Active Problems:   Venous stasis dermatitis of both lower extremities   Gram-negative bacteremia   Streptococcal bacteremia  Sepsis: Due  to proteus and GBS bacteremia probably due to abdominal and right thigh cellulitis in patient with recurrent cellulitis.  - Improving, but remains febrile. This could be due to additional infection by rhinovirus. Monitor blood cultures and follow ID recommendations.   Acute hypoxic respiratory failure: Multifactorial, likely due to sepsis in patient prone to hypoventilation/OHS, asthma, and rhinovirus respiratory infection as well as new-onset AFib.  - Continue supplemental oxygen to maintain SpO2 >90% - Treat underlying conditions - Supportive care with home bronchodilators for rhinovirus.  CAD, chronic HFpEF: s/p DES and other PCI to RCA.  - Appear euvolemic, holding lasix currently - Continue home ASA, plavix, beta blocker. Statin intolerant  Atrial fibrillation with RVR: Rate improving. Telemetry personally reviewed shows p waves occasionally. Due to uncertainty, strips reviewed electronically with Dr. Mayford Knife who agrees that this is AFib. Due to paroxysmal nature in setting of sepsis, does not recommend initiation of anticoagulation at this time.  - Will modestly increase metoprolol dosing for now. - Cardiology follow up with cardiac monitoring to determine AFib burden.  - CHA2DS2-VASc is 5; CHF (1), HTN (1), DM (1), Vasc Dx (CAD, PAD) (1), and sex (F = 1). This would indicate anticoagulation if deemed necessary by cardiology.  - Maintain K > 4 and Mg > 2; both at goal today.   Hypothyroidism: Last TSH 1.388 - Recheck TSH now that she's developed AFib - Continue synthroid.  Hypomagnesemia:  - Replace as needed and recheck  IDT2DM with hyperglycemia: Significant resistance on U500 insulin per endocrinology as outpatient.  - Resume home dose of Humulin 500.  Continue with sliding scale insulin. - Holding metformin at this time due to elevated lactic acid.  Hypertension - Holding Lasix and enalapril in the setting of volume depletion.  RA, MCTD:  - Continue home  plaquenil  Chronic pain syndrome, fibromyalgia: - Continue oxycodone ER formulary equivalent and prn oxycodone.   Moderate persistent asthma - Resume home treatments.  RLS:  - Continue home dose (reported) mirapex.   Venous stasis dermatitis: Chronic, stable. No complications at this time.  DVT prophylaxis: Lovenox Code Status: Full code Family Communication: Husband at bedside Disposition Plan: Home once clinically stabilizing.  Consultants:   ID  Cardiology, Dr. Mayford Knifeurner by phone.  Procedures:   None  Antimicrobials:  Cefepime  2/6  Tamiflu 2/6  Vancomycin 2/6 - 2/7  Ceftriaxone 2/7 >>   Subjective: Fever last night and was woken up by RN who informed her of a high HR which she did not feel. Chronic pain is flaring up "everywhere," including sporadic right neck pain and aching upper and posterior head bilaterally. Redness on right thigh is tender, slightly better than previous. Left leg which was recently treated for cellulitis is much better. Right foot redness has developed but is nontender.  Objective: Vitals:   10/19/18 0647 10/19/18 0922 10/19/18 1359 10/19/18 1414  BP: 118/84  138/90   Pulse: (!) 101  (!) 116 98  Resp:   18   Temp:   99.9 F (37.7 C)   TempSrc:   Oral   SpO2: 94% 90% 93%   Weight:      Height:        Intake/Output Summary (Last 24 hours) at 10/19/2018 1425 Last data filed at 10/19/2018 1402 Gross per 24 hour  Intake 720 ml  Output 300 ml  Net 420 ml   Filed Weights   10/17/18 2232 10/18/18 0447 10/19/18 0550  Weight: (!) 144.2 kg (!) 144.2 kg (!) 150.7 kg    Gen: 58 y.o. female in no distress  Pulm: Non-labored breathing supplemental oxygen. Clear to auscultation bilaterally.  CV: Rapid, regular at time of exam. No murmur, rub, or gallop. No JVD, + pedal edema. GI: Abdomen soft, non-tender, non-distended, with normoactive bowel sounds. No organomegaly or masses felt. Ext: Warm, no deformities Skin: Right anterolateral thigh  with mild erythema, "sore" tenderness to palpation without focal induration or fluctuance, demarcations also noted without well defined erythema borders. Left lower leg with blanchable erythema, nontender, 1+ edema bilaterally. Right dorsolateral foot with ~3in annular diameter splotchy erythema, again nontedner without fluctuance or induration.  Neuro: Alert and oriented. No focal neurological deficits. Psych: Judgement and insight appear normal. Mood & affect appropriate.   Data Reviewed: I have personally reviewed following labs and imaging studies  CBC: Recent Labs  Lab 10/17/18 1534 10/18/18 1043  WBC 15.5* 9.3  NEUTROABS 13.7*  --   HGB 13.2 11.0*  HCT 42.8 36.5  MCV 89.9 94.1  PLT 179 132*   Basic Metabolic Panel: Recent Labs  Lab 10/17/18 1534 10/17/18 2345 10/18/18 1043 10/19/18 0235  NA 137  --  135 136  K 4.2  --  3.9 4.1  CL 101  --  105 105  CO2 25  --  23 22  GLUCOSE 231*  --  252* 199*  BUN 11  --  8 9  CREATININE 1.06*  --  0.91 0.93  CALCIUM 9.2  --  7.9* 8.2*  MG  --  1.2*  --  2.2  PHOS  --  3.2  --   --    GFR: Estimated Creatinine Clearance: 93.8 mL/min (by C-G formula based on SCr of 0.93 mg/dL). Liver Function Tests: Recent Labs  Lab 10/17/18 1534 10/19/18 0235  AST 49* 39  ALT  42 33  ALKPHOS 46 43  BILITOT 0.6 0.6  PROT 6.7 6.3*  ALBUMIN 3.5 3.3*   Recent Labs  Lab 10/17/18 1534  LIPASE 33   No results for input(s): AMMONIA in the last 168 hours. Coagulation Profile: Recent Labs  Lab 10/17/18 1534  INR 1.07   Cardiac Enzymes: No results for input(s): CKTOTAL, CKMB, CKMBINDEX, TROPONINI in the last 168 hours. BNP (last 3 results) No results for input(s): PROBNP in the last 8760 hours. HbA1C: No results for input(s): HGBA1C in the last 72 hours. CBG: Recent Labs  Lab 10/18/18 1147 10/18/18 1707 10/18/18 2055 10/19/18 0728 10/19/18 1139  GLUCAP 220* 252* 213* 186* 262*   Lipid Profile: No results for input(s): CHOL,  HDL, LDLCALC, TRIG, CHOLHDL, LDLDIRECT in the last 72 hours. Thyroid Function Tests: No results for input(s): TSH, T4TOTAL, FREET4, T3FREE, THYROIDAB in the last 72 hours. Anemia Panel: No results for input(s): VITAMINB12, FOLATE, FERRITIN, TIBC, IRON, RETICCTPCT in the last 72 hours. Urine analysis:    Component Value Date/Time   COLORURINE AMBER (A) 10/18/2018 0421   APPEARANCEUR CLEAR 10/18/2018 0421   LABSPEC 1.028 10/18/2018 0421   PHURINE 5.0 10/18/2018 0421   GLUCOSEU NEGATIVE 10/18/2018 0421   HGBUR NEGATIVE 10/18/2018 0421   BILIRUBINUR NEGATIVE 10/18/2018 0421   KETONESUR 5 (A) 10/18/2018 0421   PROTEINUR NEGATIVE 10/18/2018 0421   UROBILINOGEN 0.2 02/10/2015 1835   NITRITE NEGATIVE 10/18/2018 0421   LEUKOCYTESUR NEGATIVE 10/18/2018 0421   Recent Results (from the past 240 hour(s))  Blood Culture (routine x 2)     Status: None (Preliminary result)   Collection Time: 10/17/18  3:34 PM  Result Value Ref Range Status   Specimen Description   Final    BLOOD LEFT ANTECUBITAL Performed at [facility], [address] [place], [place] [address]    Special Requests   Final    BOTTLES DRAWN AEROBIC AND ANAEROBIC Blood Culture adequate volume Performed at [facility], [address] [place], [place] [address]    Culture  Setup Time   Final    IN BOTH AEROBIC AND ANAEROBIC BOTTLES Organism ID to follow GRAM POSITIVE COCCI IN CHAINS GRAM NEGATIVE RODS CRITICAL RESULT CALLED TO, READ BACK BY AND VERIFIED WITH: Thana Ates [id number] MLM Performed at [facility], [address] [place], [place] [address]    Culture GRAM POSITIVE COCCI GRAM NEGATIVE RODS   Final   Report Status PENDING  Incomplete  Group A Strep by PCR     Status: None   Collection Time: 10/17/18  3:34 PM  Result Value Ref Range Status   Group A Strep by PCR NOT DETECTED NOT DETECTED Final    Comment: Performed at [facility], [address] [place], [place] [address]  Blood Culture ID Panel (Reflexed)     Status: Abnormal   Collection Time: 10/17/18  3:34 PM  Result Value Ref Range Status   Enterococcus species NOT DETECTED NOT DETECTED Final   Listeria monocytogenes NOT DETECTED NOT DETECTED Final   Staphylococcus species NOT DETECTED NOT DETECTED Final   Staphylococcus aureus (BCID) NOT DETECTED NOT DETECTED Final   Streptococcus species DETECTED (A) NOT DETECTED Final    Comment: CRITICAL RESULT CALLED TO, READ BACK BY AND VERIFIED WITH: PHARMD J GADHIA [id number] MLM    Streptococcus agalactiae DETECTED (A) NOT DETECTED Final    Comment: CRITICAL RESULT CALLED TO, READ BACK BY  AND VERIFIED WITH: PHARMD J GADHIA [phone] MLM    Streptococcus pneumoniae NOT DETECTED NOT DETECTED Final   Streptococcus pyogenes NOT DETECTED NOT DETECTED Final   Acinetobacter baumannii NOT DETECTED NOT DETECTED Final   Enterobacteriaceae species DETECTED (A) NOT DETECTED Final    Comment: Enterobacteriaceae represent a large family of gram-negative bacteria, not a single organism. CRITICAL RESULT CALLED TO, READ BACK BY AND VERIFIED WITH: PHARMD J GADHIA [id number] MLM    Enterobacter cloacae complex NOT DETECTED NOT DETECTED Final   Escherichia coli NOT DETECTED NOT DETECTED Final   Klebsiella oxytoca NOT DETECTED NOT DETECTED Final   Klebsiella pneumoniae NOT DETECTED NOT DETECTED Final   Proteus species DETECTED (A) NOT DETECTED Final    Comment: CRITICAL RESULT CALLED TO, READ BACK BY AND VERIFIED WITH: PHARMD J GADHIA [phone] MLM    Serratia marcescens NOT DETECTED NOT DETECTED Final   Carbapenem resistance NOT DETECTED NOT DETECTED Final   Haemophilus influenzae NOT DETECTED NOT DETECTED Final   Neisseria meningitidis NOT DETECTED NOT DETECTED Final   Pseudomonas aeruginosa NOT DETECTED NOT DETECTED Final   Candida albicans NOT DETECTED NOT DETECTED Final   Candida glabrata NOT DETECTED NOT DETECTED Final   Candida krusei NOT  DETECTED NOT DETECTED Final   Candida parapsilosis NOT DETECTED NOT DETECTED Final   Candida tropicalis NOT DETECTED NOT DETECTED Final    Comment: Performed at [facility], [address] [place], [place] [address]  Blood Culture (routine x 2)     Status: None (Preliminary result)   Collection Time: 10/17/18  3:55 PM  Result Value Ref Range Status   Specimen Description   Final    BLOOD RIGHT ANTECUBITAL Performed at [facility] [place], [address] [place], [place] [address]    Special Requests   Final    BOTTLES DRAWN AEROBIC ONLY Blood Culture adequate volume Performed at [facility], [address] [place], [place] [address]    Culture   Final    NO GROWTH < 24 HOURS Performed at [facility], [address] [place], [place] [address]    Report Status PENDING  Incomplete  MRSA PCR Screening     Status: None   Collection Time: 10/17/18 10:23 PM  Result Value Ref Range Status   MRSA by PCR NEGATIVE NEGATIVE Final    Comment:        The GeneXpert MRSA Assay (FDA approved for NASAL specimens only), is one component of a comprehensive MRSA colonization surveillance program. It is not intended to diagnose MRSA infection nor to guide or monitor treatment for MRSA infections. Performed at [facility], [address] [place], [place] [address]   Respiratory Panel by PCR     Status: Abnormal   Collection Time: 10/18/18  1:01 AM  Result Value Ref Range Status   Adenovirus NOT DETECTED NOT DETECTED Final   Coronavirus 229E NOT DETECTED NOT DETECTED Final    Comment: (NOTE) The Coronavirus on the Respiratory Panel, DOES NOT test for the novel  Coronavirus (2019 nCoV)    Coronavirus HKU1 NOT DETECTED NOT DETECTED Final   Coronavirus NL63 NOT DETECTED NOT DETECTED Final   Coronavirus OC43 NOT DETECTED NOT DETECTED Final   Metapneumovirus NOT DETECTED NOT DETECTED Final   Rhinovirus / Enterovirus DETECTED (A) NOT DETECTED  Final   Influenza A NOT DETECTED NOT DETECTED Final   Influenza B NOT DETECTED NOT DETECTED Final  Parainfluenza Virus 1 NOT DETECTED NOT DETECTED Final   Parainfluenza Virus 2 NOT DETECTED NOT DETECTED Final   Parainfluenza Virus 3 NOT DETECTED NOT DETECTED Final   Parainfluenza Virus 4 NOT DETECTED NOT DETECTED Final   Respiratory Syncytial Virus NOT DETECTED NOT DETECTED Final   Bordetella pertussis NOT DETECTED NOT DETECTED Final   Chlamydophila pneumoniae NOT DETECTED NOT DETECTED Final   Mycoplasma pneumoniae NOT DETECTED NOT DETECTED Final    Comment: Performed at [facility], [address], [place], [place] [address]  Culture, sputum-assessment     Status: None   Collection Time: 10/18/18  4:10 PM  Result Value Ref Range Status   Specimen Description EXPECTORATED SPUTUM  Final   Special Requests Normal  Final   Sputum evaluation   Final    Sputum specimen not acceptable for testing.  Please recollect.   RESULTS CALLED TO ABBY DICKIE,RN [id number] @ 1900 BY J SCOTTON Performed at [facility], [address], [place], [place] [address]    Report Status 10/18/2018 FINAL  Final      Radiology Studies: Dg Chest 2 View  Result Date: 10/17/2018 CLINICAL DATA:  Cough, shortness of breath. EXAM: CHEST - 2 VIEW COMPARISON:  01/03/2018 FINDINGS: Rotated to the LEFT. Normal heart size and mediastinal contours. Azygos fissure noted. No definite infiltrate, pleural effusion or pneumothorax. Hazy opacity of the RIGHT hemithorax versus LEFT may be related to asymmetric superimposed soft tissues, without definite pleural effusion on lateral view. Bones demineralized. IMPRESSION: No definite acute abnormalities. Electronically Signed   By: Ulyses Southward M.D.   On: 10/17/2018 16:47   Ct Abdomen Pelvis W Contrast  Result Date: 10/17/2018 CLINICAL DATA:  58 year old female with abdominal pain and distension. Fever. EXAM: CT ABDOMEN AND PELVIS WITH CONTRAST TECHNIQUE:  Multidetector CT imaging of the abdomen and pelvis was performed using the standard protocol following bolus administration of intravenous contrast. CONTRAST:  ISOVUE-300 IOPAMIDOL (ISOVUE-300) INJECTION 61% COMPARISON:  CT Abdomen and Pelvis 12/06/2008. FINDINGS: Lower chest: Stable, negative lung bases. Minimal chronic atelectasis or scarring in both middle lobes. No pericardial or pleural effusion. Hepatobiliary: Surgically absent gallbladder. Hepatic steatosis. No bile duct enlargement. Pancreas: Negative. Spleen: Negative. Adrenals/Urinary Tract: Normal adrenal glands. Bilateral renal enhancement and contrast excretion is symmetric and normal. Decompressed proximal ureters. Diminutive and unremarkable urinary bladder. Stomach/Bowel: Negative rectosigmoid colon. The sigmoid is mildly redundant. Negative descending colon. Mildly redundant but negative transverse colon. Negative right colon and terminal ileum. Appendix is diminutive or absent. No large bowel inflammation. No dilated small bowel. Rectus muscle diastasis but no abdominal hernia identified. No small bowel mesenteric stranding. Negative stomach and duodenum. No free air, free fluid. Vascular/Lymphatic: Aortoiliac calcified atherosclerosis. Suboptimal intravascular contrast bolus but the major arterial structures appear to remain patent. The portal venous system appears patent. No lymphadenopathy. Reproductive: Surgically absent uterus. Diminutive or absent ovaries. Other: Large body habitus. No pelvic free fluid. Partially visible abdominal panniculus with asymmetric subcutaneous stranding and some dystrophic calcifications (series 3, images 43 and 56. Musculoskeletal: Advanced lumbar disc, endplate and facet degeneration with progression since 2010. No acute osseous abnormality identified. IMPRESSION: 1. No acute or inflammatory process identified in the abdomen or pelvis. 2. Partially visible subcutaneous stranding of the left abdominal  panniculus. Query Cellulitis. 3. Hepatic steatosis. 4.  Aortic Atherosclerosis (ICD10-I70.0). Electronically Signed   By: Odessa Fleming M.D.   On: 10/17/2018 19:00    Scheduled Meds: . albuterol  2.5 mg Nebulization TID  .  aspirin EC  81 mg Oral Daily  . clopidogrel  75 mg Oral Q breakfast  . dextromethorphan-guaiFENesin  1 tablet Oral BID  . docusate sodium  100 mg Oral BID  . enoxaparin (LOVENOX) injection  40 mg Subcutaneous QHS  . fluticasone furoate-vilanterol  1 puff Inhalation Daily  . gabapentin  400 mg Oral 2 times per day  . gabapentin  700 mg Oral QHS  . hydroxychloroquine  200 mg Oral BID  . insulin regular human CONCENTRATED  5-115 Units Subcutaneous TID AC & HS  . levothyroxine  200 mcg Oral Q0600  . metoprolol tartrate  25 mg Oral BID  . oxyCODONE  20 mg Oral q12n4p  . pantoprazole  40 mg Oral Daily  . pramipexole  1.5 mg Oral BID  . saccharomyces boulardii  250 mg Oral TID  . senna  1 tablet Oral BID  . vitamin B-12  1,000 mcg Oral Daily   Continuous Infusions: . cefTRIAXone (ROCEPHIN)  IV 2 g (10/19/18 1230)     LOS: 2 days   Time spent: 35 minutes.  Tyrone Nine, MD [facility] [email or web address] Password [email or web address] 10/19/2018, 2:25 PM

## 2018-10-19 NOTE — Progress Notes (Addendum)
Patient ID: Emmit PomfretDawn M Trombly, female   DOB: 11-Nov-1960, 58 y.o.   MRN: [id number]         [facility]  Date of Admission:  10/17/2018    Total days of antibiotics 3        Day 2 ceftriaxone         ASSESSMENT: She has polymicrobial bacteremia with group B strep and Proteus, most likely due to right thigh cellulitis.  She has not been able to wear her compressive stockings recently because of the small ulcerations on her lower extremities.  She has had 5 recurrences of lower extremity cellulitis.  She is improving slowly.  Once we know the susceptibilities of her Proteus it will probably be safe to switch her to oral therapy to complete 5 to 7 days of total treatment.  PLAN: 1. Continue ceftriaxone pending final culture results  Principal Problem:   Recurrent cellulitis of lower extremity Active Problems:   Gram-negative bacteremia   Streptococcal bacteremia   Venous stasis dermatitis of both lower extremities   Scheduled Meds: . albuterol  2.5 mg Nebulization TID  . aspirin EC  81 mg Oral Daily  . clopidogrel  75 mg Oral Q breakfast  . dextromethorphan-guaiFENesin  1 tablet Oral BID  . docusate sodium  100 mg Oral BID  . enoxaparin (LOVENOX) injection  40 mg Subcutaneous QHS  . fluticasone furoate-vilanterol  1 puff Inhalation Daily  . gabapentin  400 mg Oral 2 times per day  . gabapentin  700 mg Oral QHS  . hydroxychloroquine  200 mg Oral BID  . insulin regular human CONCENTRATED  5-115 Units Subcutaneous TID AC & HS  . levothyroxine  200 mcg Oral Q0600  . metoprolol tartrate  25 mg Oral BID  . oxyCODONE  20 mg Oral q12n4p  . pantoprazole  40 mg Oral Daily  . pramipexole  1.5 mg Oral BID  . saccharomyces boulardii  250 mg Oral TID  . senna  1 tablet Oral BID  . vitamin B-12  1,000 mcg Oral Daily   Continuous Infusions: . cefTRIAXone (ROCEPHIN)  IV     PRN Meds:.acetaminophen **OR** acetaminophen, ondansetron **OR** ondansetron (ZOFRAN) IV,  oxyCODONE, polyethylene glycol   SUBJECTIVE: She was told that she had fever last night with elevated heart rate but was not aware of the fever and did not have any chills or sweats.  Late yesterday she noted some discomfort, swelling and redness on her right lateral thigh.  Overall she is feeling better today.  Review of Systems: Review of Systems  Constitutional: Positive for fever. Negative for chills and diaphoresis.  Respiratory: Negative for sputum production, shortness of breath and wheezing.        No change in chronic, mild cough.  Cardiovascular: Negative for chest pain.  Gastrointestinal: Negative for abdominal pain, diarrhea, nausea and vomiting.  Genitourinary: Negative for dysuria.  Skin: Positive for rash.    Allergies  Allergen Reactions  . Fish Allergy Anaphylaxis    INCLUDES OMEGA 3 OILS  . Fish Oil Anaphylaxis and Swelling    THROAT SWELLS INCLUDES FISH AS A CLASS  . Omega-3 Fatty Acids Swelling    Throat swelling  Throat swelling   . Other Anaphylaxis, Other (See Comments), Nausea And Vomiting and Shortness Of Breath    UNSPECIFIED REACTION  Perch Other reaction(s): Other (See Comments) INCLUDES OMEGA 3 OILS Perch UNSPECIFIED REACTION  Perch  . Crestor [Rosuvastatin] Other (See Comments)    Extreme joint pain,  to this & other statins  . Gabapentin Anxiety    Anxiety on high doses  . Lovastatin Other (See Comments)    EXTREME JOINT PAIN  . Metronidazole Nausea Only and Swelling    Nausea and vomiting  Other reaction(s): Other (See Comments) Headache and shakes Nausea and vomiting  . Monascus Purpureus Went Yeast Other (See Comments)    Muscle pain and severe joint pain.   . Red Yeast Rice [Cholestin] Other (See Comments)    Muscle pain and severe joint pain.   . Rotigotine Swelling    Extreme edema   . Atrovent Hfa [Ipratropium Bromide Hfa] Other (See Comments)    wheezing  . Suprep [Na Sulfate-K Sulfate-Mg Sulf] Nausea And Vomiting  . Tape  Other (See Comments)    Electrodes causes skin breakdown    OBJECTIVE: Vitals:   10/19/18 0602 10/19/18 0617 10/19/18 0647 10/19/18 0922  BP: 118/79 119/78 118/84   Pulse: (!) 111 (!) 104 (!) 101   Resp:      Temp: 98.6 F (37 C)     TempSrc: Oral     SpO2: 91% 94% 94% 90%  Weight:      Height:       Body mass index is 62.77 kg/m.  Physical Exam Constitutional:      Comments: She appears comfortable sitting up on the side of her bed.  Musculoskeletal:     Comments: She has 2+ pitting edema of both lower extremities with chronic venous stasis dermatitis.  She has scattered, small superficial ulcerations both lower extremities.  Skin:    Comments: She has a new area of redness, swelling and warmth in her right lateral thigh.  The excoriated area of her umbilicus looks better without any obvious infection.  Psychiatric:        Mood and Affect: Mood normal.     Lab Results Lab Results  Component Value Date   WBC 9.3 10/18/2018   HGB 11.0 (L) 10/18/2018   HCT 36.5 10/18/2018   MCV 94.1 10/18/2018   PLT 132 (L) 10/18/2018    Lab Results  Component Value Date   CREATININE 0.93 10/19/2018   BUN 9 10/19/2018   NA 136 10/19/2018   K 4.1 10/19/2018   CL 105 10/19/2018   CO2 22 10/19/2018    Lab Results  Component Value Date   ALT 33 10/19/2018   AST 39 10/19/2018   ALKPHOS 43 10/19/2018   BILITOT 0.6 10/19/2018     Microbiology: Recent Results (from the past 240 hour(s))  Blood Culture (routine x 2)     Status: None (Preliminary result)   Collection Time: 10/17/18  3:34 PM  Result Value Ref Range Status   Specimen Description   Final    BLOOD LEFT ANTECUBITAL Performed at [facility], [address] [place], [place] [address]    Special Requests   Final    BOTTLES DRAWN AEROBIC AND ANAEROBIC Blood Culture adequate volume Performed at [facility], [address] [place], [place] [address]    Culture  Setup Time   Final    IN BOTH  AEROBIC AND ANAEROBIC BOTTLES Organism ID to follow GRAM POSITIVE COCCI IN CHAINS GRAM NEGATIVE RODS CRITICAL RESULT CALLED TO, READ BACK BY AND VERIFIED WITH: Thana Ates [phone] MLM Performed at [facility], [address] [place], [place] [address]    Culture GRAM POSITIVE COCCI GRAM NEGATIVE RODS   Final  Report Status PENDING  Incomplete  Group A Strep by PCR     Status: None   Collection Time: 10/17/18  3:34 PM  Result Value Ref Range Status   Group A Strep by PCR NOT DETECTED NOT DETECTED Final    Comment: Performed at [facility], [address] [place], [place] [address]  Blood Culture ID Panel (Reflexed)     Status: Abnormal   Collection Time: 10/17/18  3:34 PM  Result Value Ref Range Status   Enterococcus species NOT DETECTED NOT DETECTED Final   Listeria monocytogenes NOT DETECTED NOT DETECTED Final   Staphylococcus species NOT DETECTED NOT DETECTED Final   Staphylococcus aureus (BCID) NOT DETECTED NOT DETECTED Final   Streptococcus species DETECTED (A) NOT DETECTED Final    Comment: CRITICAL RESULT CALLED TO, READ BACK BY AND VERIFIED WITH: PHARMD J GADHIA [id number] MLM    Streptococcus agalactiae DETECTED (A) NOT DETECTED Final    Comment: CRITICAL RESULT CALLED TO, READ BACK BY AND VERIFIED WITH: PHARMD J GADHIA [id number] MLM    Streptococcus pneumoniae NOT DETECTED NOT DETECTED Final   Streptococcus pyogenes NOT DETECTED NOT DETECTED Final   Acinetobacter baumannii NOT DETECTED NOT DETECTED Final   Enterobacteriaceae species DETECTED (A) NOT DETECTED Final    Comment: Enterobacteriaceae represent a large family of gram-negative bacteria, not a single organism. CRITICAL RESULT CALLED TO, READ BACK BY AND VERIFIED WITH: PHARMD J GADHIA [id number] MLM    Enterobacter cloacae complex NOT DETECTED NOT DETECTED Final   Escherichia coli NOT DETECTED NOT DETECTED Final   Klebsiella oxytoca NOT DETECTED NOT DETECTED Final    Klebsiella pneumoniae NOT DETECTED NOT DETECTED Final   Proteus species DETECTED (A) NOT DETECTED Final    Comment: CRITICAL RESULT CALLED TO, READ BACK BY AND VERIFIED WITH: PHARMD J GADHIA [id number] MLM    Serratia marcescens NOT DETECTED NOT DETECTED Final   Carbapenem resistance NOT DETECTED NOT DETECTED Final   Haemophilus influenzae NOT DETECTED NOT DETECTED Final   Neisseria meningitidis NOT DETECTED NOT DETECTED Final   Pseudomonas aeruginosa NOT DETECTED NOT DETECTED Final   Candida albicans NOT DETECTED NOT DETECTED Final   Candida glabrata NOT DETECTED NOT DETECTED Final   Candida krusei NOT DETECTED NOT DETECTED Final   Candida parapsilosis NOT DETECTED NOT DETECTED Final   Candida tropicalis NOT DETECTED NOT DETECTED Final    Comment: Performed at [facility], [address] [place], [place] [address]  Blood Culture (routine x 2)     Status: None (Preliminary result)   Collection Time: 10/17/18  3:55 PM  Result Value Ref Range Status   Specimen Description   Final    BLOOD RIGHT ANTECUBITAL Performed at [facility] [place] [facility], [address] [place], [place] [address]    Special Requests   Final    BOTTLES DRAWN AEROBIC ONLY Blood Culture adequate volume Performed at [facility], [address] [place], [place] [address]    Culture   Final    NO GROWTH < 24 HOURS Performed at [facility], [address] [place], [place] [address]    Report Status PENDING  Incomplete  MRSA PCR Screening     Status: None   Collection Time: 10/17/18 10:23 PM  Result Value Ref Range Status   MRSA by PCR NEGATIVE NEGATIVE Final    Comment:        The GeneXpert MRSA Assay (FDA approved  for NASAL specimens only), is one component of a comprehensive MRSA colonization surveillance program. It is not intended to diagnose MRSA infection nor to guide or monitor treatment for MRSA infections. Performed at [facility], [address], [place], [place] [address]   Respiratory Panel by PCR     Status: Abnormal   Collection Time: 10/18/18  1:01 AM  Result Value Ref Range Status   Adenovirus NOT DETECTED NOT DETECTED Final   Coronavirus 229E NOT DETECTED NOT DETECTED Final    Comment: (NOTE) The Coronavirus on the Respiratory Panel, DOES NOT test for the novel  Coronavirus (2019 nCoV)    Coronavirus HKU1 NOT DETECTED NOT DETECTED Final   Coronavirus NL63 NOT DETECTED NOT DETECTED Final   Coronavirus OC43 NOT DETECTED NOT DETECTED Final   Metapneumovirus NOT DETECTED NOT DETECTED Final   Rhinovirus / Enterovirus DETECTED (A) NOT DETECTED Final   Influenza A NOT DETECTED NOT DETECTED Final   Influenza B NOT DETECTED NOT DETECTED Final   Parainfluenza Virus 1 NOT DETECTED NOT DETECTED Final   Parainfluenza Virus 2 NOT DETECTED NOT DETECTED Final   Parainfluenza Virus 3 NOT DETECTED NOT DETECTED Final   Parainfluenza Virus 4 NOT DETECTED NOT DETECTED Final   Respiratory Syncytial Virus NOT DETECTED NOT DETECTED Final   Bordetella pertussis NOT DETECTED NOT DETECTED Final   Chlamydophila pneumoniae NOT DETECTED NOT DETECTED Final   Mycoplasma pneumoniae NOT DETECTED NOT DETECTED Final    Comment: Performed at [facility], [address], [place], [place] [address]  Culture, sputum-assessment     Status: None   Collection Time: 10/18/18  4:10 PM  Result Value Ref Range Status   Specimen Description EXPECTORATED SPUTUM  Final   Special Requests Normal  Final   Sputum evaluation   Final    Sputum specimen not acceptable for testing.  Please recollect.   RESULTS CALLED TO ABBY DICKIE,RN [id number] @ 1900 BY J SCOTTON Performed at [facility], [address], [place], [place] [address]    Report Status 10/18/2018 FINAL  Final    Cliffton Asters, MD [facility] [phone] pager   [phone] cell 10/19/2018, 11:26 AM

## 2018-10-20 LAB — CULTURE, BLOOD (ROUTINE X 2): Special Requests: ADEQUATE

## 2018-10-20 LAB — CBC
HCT: 36.4 % (ref 36.0–46.0)
Hemoglobin: 10.7 g/dL — ABNORMAL LOW (ref 12.0–15.0)
MCH: 27.8 pg (ref 26.0–34.0)
MCHC: 29.4 g/dL — ABNORMAL LOW (ref 30.0–36.0)
MCV: 94.5 fL (ref 80.0–100.0)
Platelets: 175 10*3/uL (ref 150–400)
RBC: 3.85 MIL/uL — ABNORMAL LOW (ref 3.87–5.11)
RDW: 14.9 % (ref 11.5–15.5)
WBC: 10.1 10*3/uL (ref 4.0–10.5)
nRBC: 0 % (ref 0.0–0.2)

## 2018-10-20 LAB — BASIC METABOLIC PANEL
Anion gap: 10 (ref 5–15)
BUN: 9 mg/dL (ref 6–20)
CO2: 23 mmol/L (ref 22–32)
Calcium: 8.5 mg/dL — ABNORMAL LOW (ref 8.9–10.3)
Chloride: 104 mmol/L (ref 98–111)
Creatinine, Ser: 0.95 mg/dL (ref 0.44–1.00)
GFR calc Af Amer: >60 mL/min (ref >60)
GFR calc non Af Amer: >60 mL/min (ref >60)
Glucose, Bld: 90 mg/dL (ref 70–99)
Potassium: 3.6 mmol/L (ref 3.5–5.1)
Sodium: 137 mmol/L (ref 135–145)

## 2018-10-20 LAB — GLUCOSE, CAPILLARY
Glucose-Capillary: 92 mg/dL (ref 70–99)
Glucose-Capillary: 253 mg/dL — ABNORMAL HIGH (ref 70–99)
Glucose-Capillary: 213 mg/dL — ABNORMAL HIGH (ref 70–99)
Glucose-Capillary: 153 mg/dL — ABNORMAL HIGH (ref 70–99)

## 2018-10-20 LAB — MAGNESIUM: Magnesium: 2.2 mg/dL (ref 1.7–2.4)

## 2018-10-20 LAB — TSH: TSH: 0.624 u[IU]/mL (ref 0.350–4.500)

## 2018-10-20 MED ORDER — AMOXICILLIN 250 MG PO CAPS
500.0000 mg | ORAL_CAPSULE | Freq: Three times a day (TID) | ORAL | Status: DC
  Administered 2018-10-20 – 2018-10-21 (×4): 500 mg via ORAL
  Filled 2018-10-20 (×5): qty 2

## 2018-10-20 MED ORDER — ENOXAPARIN SODIUM 80 MG/0.8ML ~~LOC~~ SOLN
70.0000 mg | Freq: Every day | SUBCUTANEOUS | Status: DC
  Administered 2018-10-20: 70 mg via SUBCUTANEOUS
  Filled 2018-10-20: qty 0.8

## 2018-10-20 MED ORDER — POTASSIUM CHLORIDE CRYS ER 20 MEQ PO TBCR
40.0000 meq | EXTENDED_RELEASE_TABLET | Freq: Two times a day (BID) | ORAL | Status: DC
  Filled 2018-10-20 (×3): qty 2

## 2018-10-20 MED ORDER — FUROSEMIDE 10 MG/ML IJ SOLN
40.0000 mg | Freq: Two times a day (BID) | INTRAMUSCULAR | Status: DC
  Administered 2018-10-20 – 2018-10-21 (×2): 40 mg via INTRAVENOUS
  Filled 2018-10-20 (×2): qty 4

## 2018-10-20 MED ORDER — METHOCARBAMOL 500 MG PO TABS
750.0000 mg | ORAL_TABLET | Freq: Once | ORAL | Status: AC
  Administered 2018-10-20: 750 mg via ORAL
  Filled 2018-10-20: qty 2

## 2018-10-20 MED ORDER — FUROSEMIDE 10 MG/ML IJ SOLN
40.0000 mg | Freq: Once | INTRAMUSCULAR | Status: AC
  Administered 2018-10-20: 40 mg via INTRAVENOUS
  Filled 2018-10-20: qty 4

## 2018-10-20 NOTE — Progress Notes (Signed)
Patient ID: Emmit PomfretDawn M Hogans, female   DOB: 28-Feb-1961, 58 y.o.   MRN: [id number]         [facility]  Date of Admission:  10/17/2018    Total days of antibiotics 4        Day 3 ceftriaxone         ASSESSMENT: She has right thigh cellulitis complicated by B streptococcal bacteremia.  Proteus was detected on BCID but was not recovered in culture.  This is likely an insignificant finding.  I will change her to oral amoxicillin and plan on 3 more days of treatment.  I talked to her and her husband about continuing to work on skin care and compression to counteract her chronic venous stasis.  These are the best measures to help prevent another recurrence of cellulitis.  PLAN: 1. Change ceftriaxone to amoxicillin and treat for 3 more days 2. I will sign off now  Principal Problem:   Recurrent cellulitis of lower extremity Active Problems:   Gram-negative bacteremia   Streptococcal bacteremia   Venous stasis dermatitis of both lower extremities   Scheduled Meds: . aspirin EC  81 mg Oral Daily  . clopidogrel  75 mg Oral Q breakfast  . dextromethorphan-guaiFENesin  1 tablet Oral BID  . docusate sodium  100 mg Oral BID  . enoxaparin (LOVENOX) injection  40 mg Subcutaneous QHS  . fluticasone furoate-vilanterol  1 puff Inhalation Daily  . gabapentin  400 mg Oral 2 times per day  . gabapentin  700 mg Oral QHS  . hydroxychloroquine  200 mg Oral BID  . insulin regular human CONCENTRATED  5-115 Units Subcutaneous TID AC & HS  . ipratropium-albuterol  3 mL Nebulization TID  . levothyroxine  200 mcg Oral Q0600  . metoprolol tartrate  37.5 mg Oral BID  . oxyCODONE  20 mg Oral q12n4p  . pantoprazole  40 mg Oral Daily  . pramipexole  1.5 mg Oral BID  . saccharomyces boulardii  250 mg Oral TID  . senna  1 tablet Oral BID  . vitamin B-12  1,000 mcg Oral Daily   Continuous Infusions: . cefTRIAXone (ROCEPHIN)  IV 2 g (10/19/18 1230)   PRN Meds:.acetaminophen **OR**  acetaminophen, ondansetron **OR** ondansetron (ZOFRAN) IV, oxyCODONE, polyethylene glycol   SUBJECTIVE: She is feeling much better.  Review of Systems: Review of Systems  Constitutional: Negative for chills, diaphoresis and fever.  Respiratory: Negative for sputum production, shortness of breath and wheezing.        No change in chronic, mild cough.  Cardiovascular: Negative for chest pain.  Gastrointestinal: Negative for abdominal pain, diarrhea, nausea and vomiting.  Genitourinary: Negative for dysuria.  Skin: Negative for rash.    Allergies  Allergen Reactions  . Fish Allergy Anaphylaxis    INCLUDES OMEGA 3 OILS  . Fish Oil Anaphylaxis and Swelling    THROAT SWELLS INCLUDES FISH AS A CLASS  . Omega-3 Fatty Acids Swelling    Throat swelling  Throat swelling   . Other Anaphylaxis, Other (See Comments), Nausea And Vomiting and Shortness Of Breath    UNSPECIFIED REACTION  Perch Other reaction(s): Other (See Comments) INCLUDES OMEGA 3 OILS Perch UNSPECIFIED REACTION  Perch  . Crestor [Rosuvastatin] Other (See Comments)    Extreme joint pain, to this & other statins  . Gabapentin Anxiety    Anxiety on high doses  . Lovastatin Other (See Comments)    EXTREME JOINT PAIN  . Metronidazole Nausea Only and Swelling  Nausea and vomiting  Other reaction(s): Other (See Comments) Headache and shakes Nausea and vomiting  . Monascus Purpureus Went Yeast Other (See Comments)    Muscle pain and severe joint pain.   . Red Yeast Rice [Cholestin] Other (See Comments)    Muscle pain and severe joint pain.   . Rotigotine Swelling    Extreme edema   . Atrovent Hfa [Ipratropium Bromide Hfa] Other (See Comments)    wheezing  . Suprep [Na Sulfate-K Sulfate-Mg Sulf] Nausea And Vomiting  . Tape Other (See Comments)    Electrodes causes skin breakdown    OBJECTIVE: Vitals:   10/19/18 2206 10/20/18 0048 10/20/18 0632 10/20/18 0636  BP: 127/60  (!) 144/75   Pulse: (!) 110 96 87     Resp: 20  20   Temp: 99.8 F (37.7 C)  98.5 F (36.9 C)   TempSrc: Oral  Oral   SpO2: 93%  92%   Weight:    (!) 147.7 kg  Height:       Body mass index is 61.54 kg/m.  Physical Exam Constitutional:      Comments: She appears comfortable sitting up on the side of her bed.  Musculoskeletal:     Comments: She has 2+ pitting edema of both lower extremities with chronic venous stasis dermatitis.  She has scattered, small superficial ulcerations both lower extremities.  Skin:    Comments: Her right thigh cellulitis is resolving.  Psychiatric:        Mood and Affect: Mood normal.     Lab Results Lab Results  Component Value Date   WBC 10.1 10/20/2018   HGB 10.7 (L) 10/20/2018   HCT 36.4 10/20/2018   MCV 94.5 10/20/2018   PLT 175 10/20/2018    Lab Results  Component Value Date   CREATININE 0.95 10/20/2018   BUN 9 10/20/2018   NA 137 10/20/2018   K 3.6 10/20/2018   CL 104 10/20/2018   CO2 23 10/20/2018    Lab Results  Component Value Date   ALT 33 10/19/2018   AST 39 10/19/2018   ALKPHOS 43 10/19/2018   BILITOT 0.6 10/19/2018     Microbiology: Recent Results (from the past 240 hour(s))  Blood Culture (routine x 2)     Status: Abnormal   Collection Time: 10/17/18  3:34 PM  Result Value Ref Range Status   Specimen Description   Final    BLOOD LEFT ANTECUBITAL Performed at [facility], [address] [place], [place] [address]    Special Requests   Final    BOTTLES DRAWN AEROBIC AND ANAEROBIC Blood Culture adequate volume Performed at [facility], [address] [place], [place] [address]    Culture  Setup Time   Final    IN BOTH AEROBIC AND ANAEROBIC BOTTLES GRAM POSITIVE COCCI IN CHAINS GRAM NEGATIVE RODS CRITICAL RESULT CALLED TO, READ BACK BY AND VERIFIED WITH: PHARMD J GADHIA [id number] MLM    Culture (A)  Final    GROUP B STREP(S.AGALACTIAE)ISOLATED PROTEUS SPECIES NOT RECOVERED IN CULTURE Performed at [facility], [address], [place], [place] [address]    Report Status 10/20/2018 FINAL  Final   Organism ID, Bacteria GROUP B STREP(S.AGALACTIAE)ISOLATED  Final      Susceptibility   Group b strep(s.agalactiae)isolated - MIC*    CLINDAMYCIN <=0.25 SENSITIVE Sensitive     AMPICILLIN <=0.25 SENSITIVE Sensitive     ERYTHROMYCIN <=0.12 SENSITIVE Sensitive  VANCOMYCIN 0.5 SENSITIVE Sensitive     CEFTRIAXONE <=0.12 SENSITIVE Sensitive     LEVOFLOXACIN 0.5 SENSITIVE Sensitive     PENICILLIN Value in next row Sensitive      SENSITIVE<=0.06    * GROUP B STREP(S.AGALACTIAE)ISOLATED  Group A Strep by PCR     Status: None   Collection Time: 10/17/18  3:34 PM  Result Value Ref Range Status   Group A Strep by PCR NOT DETECTED NOT DETECTED Final    Comment: Performed at [facility], [address] [place], [place] [address]  Blood Culture ID Panel (Reflexed)     Status: Abnormal   Collection Time: 10/17/18  3:34 PM  Result Value Ref Range Status   Enterococcus species NOT DETECTED NOT DETECTED Final   Listeria monocytogenes NOT DETECTED NOT DETECTED Final   Staphylococcus species NOT DETECTED NOT DETECTED Final   Staphylococcus aureus (BCID) NOT DETECTED NOT DETECTED Final   Streptococcus species DETECTED (A) NOT DETECTED Final    Comment: CRITICAL RESULT CALLED TO, READ BACK BY AND VERIFIED WITH: PHARMD J GADHIA [id number] MLM    Streptococcus agalactiae DETECTED (A) NOT DETECTED Final    Comment: CRITICAL RESULT CALLED TO, READ BACK BY AND VERIFIED WITH: PHARMD J GADHIA [id number] 0834 MLM    Streptococcus pneumoniae NOT DETECTED NOT DETECTED Final   Streptococcus pyogenes NOT DETECTED NOT DETECTED Final   Acinetobacter baumannii NOT DETECTED NOT DETECTED Final   Enterobacteriaceae species DETECTED (A) NOT DETECTED Final    Comment: Enterobacteriaceae represent a large family of gram-negative bacteria, not a single organism. CRITICAL RESULT CALLED TO, READ BACK BY AND VERIFIED  WITH: PHARMD J GADHIA [id number] MLM    Enterobacter cloacae complex NOT DETECTED NOT DETECTED Final   Escherichia coli NOT DETECTED NOT DETECTED Final   Klebsiella oxytoca NOT DETECTED NOT DETECTED Final   Klebsiella pneumoniae NOT DETECTED NOT DETECTED Final   Proteus species DETECTED (A) NOT DETECTED Final    Comment: CRITICAL RESULT CALLED TO, READ BACK BY AND VERIFIED WITH: PHARMD J GADHIA [id number] 0834 MLM    Serratia marcescens NOT DETECTED NOT DETECTED Final   Carbapenem resistance NOT DETECTED NOT DETECTED Final   Haemophilus influenzae NOT DETECTED NOT DETECTED Final   Neisseria meningitidis NOT DETECTED NOT DETECTED Final   Pseudomonas aeruginosa NOT DETECTED NOT DETECTED Final   Candida albicans NOT DETECTED NOT DETECTED Final   Candida glabrata NOT DETECTED NOT DETECTED Final   Candida krusei NOT DETECTED NOT DETECTED Final   Candida parapsilosis NOT DETECTED NOT DETECTED Final   Candida tropicalis NOT DETECTED NOT DETECTED Final    Comment: Performed at [facility], [address], [place], [place] [address]  Blood Culture (routine x 2)     Status: None (Preliminary result)   Collection Time: 10/17/18  3:55 PM  Result Value Ref Range Status   Specimen Description   Final    BLOOD RIGHT ANTECUBITAL Performed at [facility], [address] [place], [place] [address]    Special Requests   Final    BOTTLES DRAWN AEROBIC ONLY Blood Culture adequate volume Performed at [facility], [address] [place], [place] [address]    Culture   Final    NO GROWTH 2 DAYS Performed at [facility], [address], [place], [place] [address]    Report Status PENDING  Incomplete  MRSA PCR Screening     Status: None  Collection Time: 10/17/18 10:23 PM  Result Value Ref Range Status   MRSA by PCR NEGATIVE NEGATIVE Final    Comment:        The GeneXpert MRSA Assay (FDA approved for NASAL specimens only), is one component of a comprehensive  MRSA colonization surveillance program. It is not intended to diagnose MRSA infection nor to guide or monitor treatment for MRSA infections. Performed at [facility], [address] [place], [place] [address]   Respiratory Panel by PCR     Status: Abnormal   Collection Time: 10/18/18  1:01 AM  Result Value Ref Range Status   Adenovirus NOT DETECTED NOT DETECTED Final   Coronavirus 229E NOT DETECTED NOT DETECTED Final    Comment: (NOTE) The Coronavirus on the Respiratory Panel, DOES NOT test for the novel  Coronavirus (2019 nCoV)    Coronavirus HKU1 NOT DETECTED NOT DETECTED Final   Coronavirus NL63 NOT DETECTED NOT DETECTED Final   Coronavirus OC43 NOT DETECTED NOT DETECTED Final   Metapneumovirus NOT DETECTED NOT DETECTED Final   Rhinovirus / Enterovirus DETECTED (A) NOT DETECTED Final   Influenza A NOT DETECTED NOT DETECTED Final   Influenza B NOT DETECTED NOT DETECTED Final   Parainfluenza Virus 1 NOT DETECTED NOT DETECTED Final   Parainfluenza Virus 2 NOT DETECTED NOT DETECTED Final   Parainfluenza Virus 3 NOT DETECTED NOT DETECTED Final   Parainfluenza Virus 4 NOT DETECTED NOT DETECTED Final   Respiratory Syncytial Virus NOT DETECTED NOT DETECTED Final   Bordetella pertussis NOT DETECTED NOT DETECTED Final   Chlamydophila pneumoniae NOT DETECTED NOT DETECTED Final   Mycoplasma pneumoniae NOT DETECTED NOT DETECTED Final    Comment: Performed at [facility], [address] [place], [place] [address]  Culture, sputum-assessment     Status: None   Collection Time: 10/18/18  4:10 PM  Result Value Ref Range Status   Specimen Description EXPECTORATED SPUTUM  Final   Special Requests Normal  Final   Sputum evaluation   Final    Sputum specimen not acceptable for testing.  Please recollect.   RESULTS CALLED TO ABBY DICKIE,RN [phone] @ 1900 BY J SCOTTON Performed at [facility], [address] [place], [place] [address]    Report  Status 10/18/2018 FINAL  Final    Cliffton AstersJohn Baudelio Karnes, MD [facility] [phone] pager   [phone] cell 10/20/2018, 9:52 AM

## 2018-10-20 NOTE — Progress Notes (Signed)
PROGRESS NOTE  [name]  ZOX:[id number] DOB: 01/17/1961 DOA: 10/17/2018 PCP: Sharlene Dory, DO   Brief Narrative: [name] Loflin Riffellis a 58 y.o.femalewith medical history significant forCAD status post PCI, heart failure with preserved ejection fraction, hypertension, mixed connective tissue disorder, moderate persistent asthma, morbid obesity, IBS, chronic pain, type 2 diabetes mellitus with diabetic neuropathy, hypothyroidism and restless leg syndrome who presented to the [facility] ED with 1 day of fever, periumbilical abdominal pain, nausea and 4 episodes of nonbloody, nonbilious emesis. Patient reports that she was in her usual state of health until waking up in the morning, at which time she felt quite nauseated and had multiple episodes of vomiting.  She endorses chronic cough occasionally productive of yellow sputum that is largely unchanged from baseline and denies shortness of breath above her baseline. She was recently treated with 1 week of doxycycline for left lower extremity cellulitis from 1/22 through 1/29, with subsequent improvement in the appearance of that leg. She feels much improved after initial resuscitation in the emergency department.  CT abdomen pelvis with contrast was obtained demonstrating no acute intra-abdominal process, normal-appearing lung bases and subcutaneous stranding of the pannus possibly consistent with cellulitis.   Blood cultures have grown Proteus and group B streptococcus. ID consulted and antibiotics narrowed to ceftriaxone pending final cultures and susceptibilities. Respiratory virus panel has also revealed rhinoviral infection. She continues to be hypoxic and developed AFib with RVR overnight into 2/8.   Assessment & Plan: Principal Problem:   Recurrent cellulitis of lower extremity Active Problems:   Venous stasis dermatitis of both lower extremities   Gram-negative bacteremia   Streptococcal bacteremia  Sepsis: Due  to GBS bacteremia probably due to abdominal and right thigh cellulitis in patient with recurrent cellulitis. Proteus in BCID, not borne out in cultures felt to be insignificant per ID. - Improving, defervescing. Transition to amoxicillin per ID for 3 more days.   Acute hypoxic respiratory failure: Multifactorial, likely due to sepsis in patient prone to hypoventilation/OHS, asthma, and rhinovirus respiratory infection as well as new-onset AFib.  - Continue supplemental oxygen to maintain SpO2 >90% - Treat underlying conditions - Supportive care with home bronchodilators for rhinovirus.  CAD, chronic HFpEF: s/p DES and other PCI to RCA. Echo July 2019 with normal LV function, mild LAE. BNP mildly elevated here. Pt reports 50lbs weight gain in past 6 months. Weights from outpatient visits noted to have steadily increased from 135kg > 136kg in Oct > 141kg in Dec > 143kg in Jan > 144.8kg in late January and 147.7kg now (roughly 28lbs weight gain).  - Legs swollen, complicated picture with stasis. Lasix given with some improvement, brisk UOP yesterday. Will give  IV BID today and monitor UOP, Cr.  - Continue home ASA, plavix, beta blocker. Statin intolerant  Atrial fibrillation with RVR: Rate improving. Telemetry personally reviewed shows p waves occasionally. Due to uncertainty, strips reviewed electronically with Dr. Mayford Knife who agrees that this is AFib. Due to paroxysmal nature in setting of sepsis, does not recommend initiation of anticoagulation at this time.  - Will modestly increase metoprolol dosing for now. - Cardiology follow up with cardiac monitoring to determine AFib burden.  - CHA2DS2-VASc is 5; CHF (1), HTN (1), DM (1), Vasc Dx (CAD, PAD) (1), and sex (F = 1). This would indicate anticoagulation if deemed necessary by cardiology.  - Maintain K > 4, will supplement as diuretics augmented as well. Also keep Mg > 2, at goal today.  Hypothyroidism: Last TSH 1.388, TSH 0.624.  - Continue  synthroid.  Hypomagnesemia:  - Replace as needed and recheck  IDT2DM with hyperglycemia: Significant resistance on U500 insulin per endocrinology as outpatient.  - Resume home dose of Humulin 500.  Continue with sliding scale insulin. - Holding metformin at this time due to elevated lactic acid.  Hypertension - Holding Lasix and enalapril in the setting of volume depletion.  RA, MCTD:  - Continue home plaquenil  Chronic pain syndrome, fibromyalgia: - Continue oxycodone ER formulary equivalent and prn oxycodone.   Moderate persistent asthma - Resume home treatments.  RLS:  - Continue home dose (reported) mirapex.   Venous stasis dermatitis: Chronic, stable. No complications at this time.  DVT prophylaxis: Lovenox Code Status: Full code Family Communication: Husband at bedside Disposition Plan: Home once clinically stabilizing.  Consultants:   ID  Cardiology, Dr. Mayford Knife by phone.  Procedures:   None  Antimicrobials:  Cefepime  2/6  Tamiflu 2/6  Vancomycin 2/6 - 2/7  Ceftriaxone 2/7 >>   Subjective: No fever in >24 hours, feels better generally today. Breathing better. Still feels very swollen in legs, more than baseline. Reports about 50lbs weight gain since Aug 2019.  Objective: Vitals:   10/20/18 0048 10/20/18 0632 10/20/18 0636 10/20/18 1114  BP:  (!) 144/75    Pulse: 96 87    Resp:  20    Temp:  98.5 F (36.9 C)    TempSrc:  Oral    SpO2:  92%  93%  Weight:   (!) 147.7 kg   Height:        Intake/Output Summary (Last 24 hours) at 10/20/2018 1327 Last data filed at 10/20/2018 1254 Gross per 24 hour  Intake 1180 ml  Output 4150 ml  Net -2970 ml   Filed Weights   10/18/18 0447 10/19/18 0550 10/20/18 0636  Weight: (!) 144.2 kg (!) 150.7 kg (!) 147.7 kg   Gen: 58 y.o. female in no distress Pulm: Nonlabored breathing room air. Some crackles. CV: Regular rate and rhythm. No murmur, rub, or gallop. No JVD, + dependent edema. GI: Abdomen  soft, non-tender, non-distended, with normoactive bowel sounds.  Ext: Warm, no deformities Skin: Right lateral thigh with improved, very minimal tenderness within demarcated area and near resolution of erythema. LE's stable erythema/hemosiderosis, right foot with nonblanchable erythema locally on lateral dorsum. Otherwise no rashes, lesions or ulcers on visualized skin. Neuro: Alert and oriented. No focal neurological deficits. Psych: Judgement and insight appear fair. Mood euthymic & affect congruent. Behavior is appropriate.    Telemetry independently reviewed today, showing no further AFib in past 24 hours, occasional PVCs, otherwise NSR with intermittent sinus tachycardia.  Data Reviewed: I have personally reviewed following labs and imaging studies  CBC: Recent Labs  Lab 10/17/18 1534 10/18/18 1043 10/20/18 0250  WBC 15.5* 9.3 10.1  NEUTROABS 13.7*  --   --   HGB 13.2 11.0* 10.7*  HCT 42.8 36.5 36.4  MCV 89.9 94.1 94.5  PLT 179 132* 175   Basic Metabolic Panel: Recent Labs  Lab 10/17/18 1534 10/17/18 2345 10/18/18 1043 10/19/18 0235 10/20/18 0250  NA 137  --  135 136 137  K 4.2  --  3.9 4.1 3.6  CL 101  --  105 105 104  CO2 25  --  GLUCOSE 231*  --  252* 199* 90  BUN 11  --  CREATININE 1.06*  --  0.91 0.93 0.95  CALCIUM 9.2  --  7.9* 8.2* 8.5*  MG  --  1.2*  --  2.2 2.2  PHOS  --  3.2  --   --   --    GFR: Estimated Creatinine Clearance: 90.6 mL/min (by C-G formula based on SCr of 0.95 mg/dL). Liver Function Tests: Recent Labs  Lab 10/17/18 1534 10/19/18 0235  AST 49* 39  ALT 42 33  ALKPHOS 46 43  BILITOT 0.6 0.6  PROT 6.7 6.3*  ALBUMIN 3.5 3.3*   Recent Labs  Lab 10/17/18 1534  LIPASE 33   No results for input(s): AMMONIA in the last 168 hours. Coagulation Profile: Recent Labs  Lab 10/17/18 1534  INR 1.07   Cardiac Enzymes: No results for input(s): CKTOTAL, CKMB, CKMBINDEX, TROPONINI in the last 168 hours. BNP (last 3  results) No results for input(s): PROBNP in the last 8760 hours. HbA1C: No results for input(s): HGBA1C in the last 72 hours. CBG: Recent Labs  Lab 10/19/18 1139 10/19/18 1733 10/19/18 2210 10/20/18 0739 10/20/18 1155  GLUCAP 262* 142* 143* 92 253*   Lipid Profile: No results for input(s): CHOL, HDL, LDLCALC, TRIG, CHOLHDL, LDLDIRECT in the last 72 hours. Thyroid Function Tests: Recent Labs    10/20/18 0250  TSH 0.624   Anemia Panel: No results for input(s): VITAMINB12, FOLATE, FERRITIN, TIBC, IRON, RETICCTPCT in the last 72 hours. Urine analysis:    Component Value Date/Time   COLORURINE AMBER (A) 10/18/2018 0421   APPEARANCEUR CLEAR 10/18/2018 0421   LABSPEC 1.028 10/18/2018 0421   PHURINE 5.0 10/18/2018 0421   GLUCOSEU NEGATIVE 10/18/2018 0421   HGBUR NEGATIVE 10/18/2018 0421   BILIRUBINUR NEGATIVE 10/18/2018 0421   KETONESUR 5 (A) 10/18/2018 0421   PROTEINUR NEGATIVE 10/18/2018 0421   UROBILINOGEN 0.2 02/10/2015 1835   NITRITE NEGATIVE 10/18/2018 0421   LEUKOCYTESUR NEGATIVE 10/18/2018 0421   Recent Results (from the past 240 hour(s))  Blood Culture (routine x 2)     Status: Abnormal   Collection Time: 10/17/18  3:34 PM  Result Value Ref Range Status   Specimen Description   Final    BLOOD LEFT ANTECUBITAL Performed at [facility], [address] [place], [place] [address]    Special Requests   Final    BOTTLES DRAWN AEROBIC AND ANAEROBIC Blood Culture adequate volume Performed at [facility], [address] [place], [place] [address]    Culture  Setup Time   Final    IN BOTH AEROBIC AND ANAEROBIC BOTTLES GRAM POSITIVE COCCI IN CHAINS GRAM NEGATIVE RODS CRITICAL RESULT CALLED TO, READ BACK BY AND VERIFIED WITH: PHARMD J GADHIA [phone] MLM    Culture (A)  Final    GROUP B STREP(S.AGALACTIAE)ISOLATED PROTEUS SPECIES NOT RECOVERED IN CULTURE Performed at [facility], [address], [place], [place] [address]     Report Status 10/20/2018 FINAL  Final   Organism ID, Bacteria GROUP B STREP(S.AGALACTIAE)ISOLATED  Final      Susceptibility   Group b strep(s.agalactiae)isolated - MIC*    CLINDAMYCIN <=0.25 SENSITIVE Sensitive     AMPICILLIN <=0.25 SENSITIVE Sensitive     ERYTHROMYCIN <=0.12 SENSITIVE Sensitive     VANCOMYCIN 0.5 SENSITIVE Sensitive     CEFTRIAXONE <=0.12 SENSITIVE Sensitive     LEVOFLOXACIN 0.5 SENSITIVE Sensitive     PENICILLIN Value in next row Sensitive      SENSITIVE<=0.06    * GROUP B STREP(S.AGALACTIAE)ISOLATED  Group A Strep by PCR  Status: None   Collection Time: 10/17/18  3:34 PM  Result Value Ref Range Status   Group A Strep by PCR NOT DETECTED NOT DETECTED Final    Comment: Performed at [facility] [place] [facility], [address] [place], [place] [address]  Blood Culture ID Panel (Reflexed)     Status: Abnormal   Collection Time: 10/17/18  3:34 PM  Result Value Ref Range Status   Enterococcus species NOT DETECTED NOT DETECTED Final   Listeria monocytogenes NOT DETECTED NOT DETECTED Final   Staphylococcus species NOT DETECTED NOT DETECTED Final   Staphylococcus aureus (BCID) NOT DETECTED NOT DETECTED Final   Streptococcus species DETECTED (A) NOT DETECTED Final    Comment: CRITICAL RESULT CALLED TO, READ BACK BY AND VERIFIED WITH: PHARMD J GADHIA [phone] MLM    Streptococcus agalactiae DETECTED (A) NOT DETECTED Final    Comment: CRITICAL RESULT CALLED TO, READ BACK BY AND VERIFIED WITH: PHARMD J GADHIA [phone] MLM    Streptococcus pneumoniae NOT DETECTED NOT DETECTED Final   Streptococcus pyogenes NOT DETECTED NOT DETECTED Final   Acinetobacter baumannii NOT DETECTED NOT DETECTED Final   Enterobacteriaceae species DETECTED (A) NOT DETECTED Final    Comment: Enterobacteriaceae represent a large family of gram-negative bacteria, not a single organism. CRITICAL RESULT CALLED TO, READ BACK BY AND VERIFIED WITH: PHARMD J GADHIA [phone] MLM    Enterobacter  cloacae complex NOT DETECTED NOT DETECTED Final   Escherichia coli NOT DETECTED NOT DETECTED Final   Klebsiella oxytoca NOT DETECTED NOT DETECTED Final   Klebsiella pneumoniae NOT DETECTED NOT DETECTED Final   Proteus species DETECTED (A) NOT DETECTED Final    Comment: CRITICAL RESULT CALLED TO, READ BACK BY AND VERIFIED WITH: PHARMD J GADHIA [phone] MLM    Serratia marcescens NOT DETECTED NOT DETECTED Final   Carbapenem resistance NOT DETECTED NOT DETECTED Final   Haemophilus influenzae NOT DETECTED NOT DETECTED Final   Neisseria meningitidis NOT DETECTED NOT DETECTED Final   Pseudomonas aeruginosa NOT DETECTED NOT DETECTED Final   Candida albicans NOT DETECTED NOT DETECTED Final   Candida glabrata NOT DETECTED NOT DETECTED Final   Candida krusei NOT DETECTED NOT DETECTED Final   Candida parapsilosis NOT DETECTED NOT DETECTED Final   Candida tropicalis NOT DETECTED NOT DETECTED Final    Comment: Performed at [facility], [address] [place], [place] [address]  Blood Culture (routine x 2)     Status: None (Preliminary result)   Collection Time: 10/17/18  3:55 PM  Result Value Ref Range Status   Specimen Description   Final    BLOOD RIGHT ANTECUBITAL Performed at [facility] [place], [address] [place], [place] [address]    Special Requests   Final    BOTTLES DRAWN AEROBIC ONLY Blood Culture adequate volume Performed at [facility] [place] [facility], [address] [place], [place] [address]    Culture   Final    NO GROWTH 2 DAYS Performed at [facility], [address] [place], [place] [address]    Report Status PENDING  Incomplete  MRSA PCR Screening     Status: None   Collection Time: 10/17/18 10:23 PM  Result Value Ref Range Status   MRSA by PCR NEGATIVE NEGATIVE Final    Comment:        The GeneXpert MRSA Assay (FDA approved for NASAL specimens only), is one component of a comprehensive MRSA colonization surveillance program. It is  not intended to diagnose MRSA infection nor to guide or monitor treatment for MRSA infections. Performed at [facility], [address] [place], [place] [address]   Respiratory Panel by PCR     Status: Abnormal   Collection Time: 10/18/18  1:01 AM  Result Value Ref Range Status   Adenovirus NOT DETECTED NOT DETECTED Final   Coronavirus 229E NOT DETECTED NOT DETECTED Final    Comment: (NOTE) The Coronavirus on the Respiratory Panel, DOES NOT test for the novel  Coronavirus (2019 nCoV)    Coronavirus HKU1 NOT DETECTED NOT DETECTED Final   Coronavirus NL63 NOT DETECTED NOT DETECTED Final   Coronavirus OC43 NOT DETECTED NOT DETECTED Final   Metapneumovirus NOT DETECTED NOT DETECTED Final   Rhinovirus / Enterovirus DETECTED (A) NOT DETECTED Final   Influenza A NOT DETECTED NOT DETECTED Final   Influenza B NOT DETECTED NOT DETECTED Final   Parainfluenza Virus 1 NOT DETECTED NOT DETECTED Final   Parainfluenza Virus 2 NOT DETECTED NOT DETECTED Final   Parainfluenza Virus 3 NOT DETECTED NOT DETECTED Final   Parainfluenza Virus 4 NOT DETECTED NOT DETECTED Final   Respiratory Syncytial Virus NOT DETECTED NOT DETECTED Final   Bordetella pertussis NOT DETECTED NOT DETECTED Final   Chlamydophila pneumoniae NOT DETECTED NOT DETECTED Final   Mycoplasma pneumoniae NOT DETECTED NOT DETECTED Final    Comment: Performed at [facility], [address] [place], [place] [address]  Culture, sputum-assessment     Status: None   Collection Time: 10/18/18  4:10 PM  Result Value Ref Range Status   Specimen Description EXPECTORATED SPUTUM  Final   Special Requests Normal  Final   Sputum evaluation   Final    Sputum specimen not acceptable for testing.  Please recollect.   RESULTS CALLED TO ABBY DICKIE,RN [phone] @ 1900 BY J SCOTTON Performed at [facility], [address] [place], [place] [address]    Report Status 10/18/2018 FINAL  Final       Radiology Studies: No results found.  Scheduled Meds: . amoxicillin  500 mg Oral Q8H  . aspirin EC  81 mg Oral Daily  . clopidogrel  75 mg Oral Q breakfast  . dextromethorphan-guaiFENesin  1 tablet Oral BID  . docusate sodium  100 mg Oral BID  . enoxaparin (LOVENOX) injection  70 mg Subcutaneous QHS  . fluticasone furoate-vilanterol  1 puff Inhalation Daily  . gabapentin  400 mg Oral 2 times per day  . gabapentin  700 mg Oral QHS  . hydroxychloroquine  200 mg Oral BID  . insulin regular human CONCENTRATED  5-115 Units Subcutaneous TID AC & HS  . ipratropium-albuterol  3 mL Nebulization TID  . levothyroxine  200 mcg Oral Q0600  . metoprolol tartrate  37.5 mg Oral BID  . oxyCODONE  20 mg Oral q12n4p  . pantoprazole  40 mg Oral Daily  . pramipexole  1.5 mg Oral BID  . saccharomyces boulardii  250 mg Oral TID  . senna  1 tablet Oral BID  . vitamin B-12  1,000 mcg Oral Daily   Continuous Infusions:    LOS: 3 days   Time spent: 35 minutes.  Tyrone Nineyan B Brelyn Woehl, MD [facility] [email or web address] Password [email or web address] 10/20/2018, 1:27 PM

## 2018-10-21 LAB — BASIC METABOLIC PANEL
Anion gap: 11 (ref 5–15)
BUN: 11 mg/dL (ref 6–20)
CO2: 25 mmol/L (ref 22–32)
Calcium: 8.4 mg/dL — ABNORMAL LOW (ref 8.9–10.3)
Chloride: 101 mmol/L (ref 98–111)
Creatinine, Ser: 0.86 mg/dL (ref 0.44–1.00)
GFR calc Af Amer: >60 mL/min (ref >60)
GFR calc non Af Amer: >60 mL/min (ref >60)
Glucose, Bld: 154 mg/dL — ABNORMAL HIGH (ref 70–99)
Potassium: 3.5 mmol/L (ref 3.5–5.1)
Sodium: 137 mmol/L (ref 135–145)

## 2018-10-21 LAB — GLUCOSE, CAPILLARY
Glucose-Capillary: 159 mg/dL — ABNORMAL HIGH (ref 70–99)
Glucose-Capillary: 188 mg/dL — ABNORMAL HIGH (ref 70–99)

## 2018-10-21 MED ORDER — METOPROLOL TARTRATE 25 MG PO TABS: 37.5000 mg | ORAL_TABLET | Freq: Two times a day (BID) | ORAL | 0 refills | Status: DC

## 2018-10-21 MED ORDER — POTASSIUM CHLORIDE CRYS ER 20 MEQ PO TBCR: 20.0000 meq | EXTENDED_RELEASE_TABLET | Freq: Two times a day (BID) | ORAL | 0 refills | Status: DC

## 2018-10-21 MED ORDER — METHOCARBAMOL 500 MG PO TABS: 500.0000 mg | ORAL_TABLET | Freq: Three times a day (TID) | ORAL | 0 refills | Status: AC | PRN

## 2018-10-21 MED ORDER — PROMETHAZINE HCL 25 MG/ML IJ SOLN
12.5000 mg | Freq: Once | INTRAMUSCULAR | Status: AC
  Administered 2018-10-21: 12.5 mg via INTRAVENOUS
  Filled 2018-10-21: qty 1

## 2018-10-21 MED ORDER — FUROSEMIDE 40 MG PO TABS: 60.0000 mg | ORAL_TABLET | Freq: Two times a day (BID) | ORAL | 0 refills | Status: DC

## 2018-10-21 MED ORDER — METHOCARBAMOL 500 MG PO TABS
750.0000 mg | ORAL_TABLET | Freq: Once | ORAL | Status: AC
  Administered 2018-10-21: 750 mg via ORAL
  Filled 2018-10-21: qty 2

## 2018-10-21 MED ORDER — AMOXICILLIN 500 MG PO CAPS: 500.0000 mg | ORAL_CAPSULE | Freq: Three times a day (TID) | ORAL | 0 refills | Status: DC

## 2018-10-21 NOTE — Progress Notes (Signed)
[facility] will supply home O2.

## 2018-10-21 NOTE — Evaluation (Signed)
Occupational Therapy Evaluation Patient Details Name: [name] MRN: [id number] DOB: 1960-11-20 Today's Date: 10/21/2018    History of Present Illness 58 y.o. female with medical history significant for CAD status post PCI, heart failure with preserved ejection fraction, hypertension, mixed connective tissue disorder, moderate persistent asthma, morbid obesity, IBS, chronic pain, type 2 diabetes mellitus with diabetic neuropathy, hypothyroidism and restless leg syndrome and admitted for recurrent LE cellulitis and also with Acute hypoxic respiratory failure: Multifactorial, likely due to sepsis in patient prone to hypoventilation/OHS, asthma, and rhinovirus respiratory infection as well as new-onset AFib   Clinical Impression   OT education complete regarding ADL activity ( safety and energy conservation)    Follow Up Recommendations  No OT follow up    Equipment Recommendations  None recommended by OT    Recommendations for Other Services       Precautions / Restrictions Precautions Precautions: None Precaution Comments: monitor sats      Mobility Bed Mobility               General bed mobility comments: pt sitting EOB on arrival  Transfers Overall transfer level: Needs assistance Equipment used: Rolling walker (2 wheeled) Transfers: Sit to/from [employer] Sit to Stand: Supervision Stand pivot transfers: Min guard;Supervision       General transfer comment: min/guard for safety with lines    Balance Overall balance assessment: No apparent balance deficits (not formally assessed)(denies any falls)                                         ADL either performed or assessed with clinical judgement   ADL Overall ADL's : Needs assistance/impaired Eating/Feeding: Sitting;Set up   Grooming: Set up;Sitting   Upper Body Bathing: Set up;Sitting   Lower Body Bathing: Minimal assistance;Sit to/from stand   Upper Body Dressing  : Set up;Sitting   Lower Body Dressing: Minimal assistance;Sit to/from stand   Toilet Transfer: Supervision/safety;Comfort height toilet   Toileting- Clothing Manipulation and Hygiene: Supervision/safety;Sit to/from stand;Cueing for sequencing;Cueing for safety       Functional mobility during ADLs: Supervision/safety;Cueing for sequencing General ADL Comments: OT session focused on energy conservation. Hand out provided and gone over in detail.  Pt and husband verbalized understanding of energy conservation,       Vision Patient Visual Report: No change from baseline              Pertinent Vitals/Pain Pain Assessment: No/denies pain     Hand Dominance     Extremity/Trunk Assessment Upper Extremity Assessment Upper Extremity Assessment: Generalized weakness           Communication Communication Communication: No difficulties   Cognition Arousal/Alertness: Awake/alert Behavior During Therapy: WFL for tasks assessed/performed Overall Cognitive Status: Within Functional Limits for tasks assessed                                                Home Living Family/patient expects to be discharged to:: Private residence Living Arrangements: Spouse/significant other   Type of Home: House       Home Layout: Able to live on main level with bedroom/bathroom               Home Equipment: Walker - 4 wheels  Prior Functioning/Environment Level of Independence: Independent        Comments: reports mostly household ambulator over the past year, typically does not wear oxygen        OT Problem List: Decreased activity tolerance      OT Treatment/Interventions:      OT Goals(Current goals can be found in the care plan section) Acute Rehab OT Goals Patient Stated Goal: home OT Goal Formulation: With patient  OT Frequency:      AM-PAC OT "6 Clicks" Daily Activity     Outcome Measure Help from another person eating meals?:  None Help from another person taking care of personal grooming?: None Help from another person toileting, which includes using toliet, bedpan, or urinal?: A Little Help from another person bathing (including washing, rinsing, drying)?: None Help from another person to put on and taking off regular upper body clothing?: None Help from another person to put on and taking off regular lower body clothing?: A Little 6 Click Score: 22   End of Session Equipment Utilized During Treatment: Rolling walker  Activity Tolerance: Patient tolerated treatment well Patient left: in bed;with call bell/phone within reach(sitting EOB)                   Time: 9450-3888 OT Time Calculation (min): 28 min Charges:  OT General Charges $OT Visit: 1 Visit OT Evaluation $OT Eval Moderate Complexity: 1 Mod OT Treatments $Self Care/Home Management : 8-22 mins  Lise Auer, OT Acute Rehabilitation Services [phone] Office- [phone]     Brelan Hannen, Karin Golden D 10/21/2018, 6:00 PM

## 2018-10-21 NOTE — Care Management Note (Signed)
Case Management Note  Patient Details  Name: [name] MRN: [id number] Date of Birth: 18-May-1961  Subjective/Objective:                    Action/Plan:Pt plan to discharge home with no needs at present time.    Expected Discharge Date:  10/21/18               Expected Discharge Plan:  Home/Self Care  In-House Referral:  [facility]  Discharge planning Services  CM Consult  Post Acute Care Choice:    Choice offered to:     DME Arranged:    DME Agency:     HH Arranged:    HH Agency:     Status of Service:  Completed, signed off  If discussed at [employer] of Stay Meetings, dates discussed:    Additional CommentsGeni Bers, RN 10/21/2018, 11:53 AM

## 2018-10-21 NOTE — Discharge Summary (Signed)
Physician Discharge Summary  [name] GUY:[id number] DOB: Aug 03, 1961 DOA: 10/17/2018  PCP: Sharlene Dory, DO  Admit date: 10/17/2018 Discharge date: 10/21/2018  Admitted From: Home Disposition: Home   Recommendations for Outpatient Follow-up:  1. Follow up with PCP in 1-2 weeks 2. Repeat ambulatory pulse oximetry. 3. Please obtain BMP/CBC in one week 4. Follow up with cardiology in 1-2 weeks for follow up of brief AFib in setting of sepsis, consider heart monitoring. Also evaluate for volume status, advised to take lasix 60mg  po BID until follow up. Weight at DC 147.5kg.   Home Health: None Equipment/Devices: 3L O2 Discharge Condition: Stable CODE STATUS: Full Diet recommendation: Heart healthy, carb-modified  Brief/Interim Summary: [name] M Riffellis a 58 y.o.femalewith medical history significant forCAD status post PCI, heart failure with preserved ejection fraction, hypertension, mixed connective tissue disorder, moderate persistent asthma, morbid obesity, IBS, chronic pain, type 2 diabetes mellitus with diabetic neuropathy, hypothyroidism and restless leg syndrome who presented to the med [facility] ED with 1 day of fever, periumbilical abdominal pain, nausea and 4 episodes of nonbloody, nonbilious emesis. Patient reports that she was in her usual state of health until waking up in the morning, at which time she felt quite nauseated and had multiple episodes of vomiting.  She endorses chronic cough occasionally productive of yellow sputum that is largely unchanged from baseline and denies shortness of breath above her baseline. She was recently treated with 1 week of doxycycline for left lower extremity cellulitis from 1/22 through 1/29, with subsequent improvement in the appearance of that leg. She feels much improved after initial resuscitation in the emergency department.  CT abdomen pelvis with contrast was obtained demonstrating no acute intra-abdominal  process, normal-appearing lung bases and subcutaneous stranding of the pannus possibly consistent with cellulitis.  Blood cultures have grown Proteus and group B streptococcus. ID consulted and antibiotics narrowed to ceftriaxone pending final cultures and susceptibilities, ultimately narrowed to amoxicillin to complete Tx of 7 days duration. Respiratory virus panel has also revealed rhinoviral infection which is contributing to ongoing oxygen requirement. She continues to be hypoxic and developed AFib with RVR overnight into 2/8 though this terminated abruptly and maintain sinus rhythm thereafter.   Discharge Diagnoses:  Principal Problem:   Recurrent cellulitis of lower extremity Active Problems:   Venous stasis dermatitis of both lower extremities   Gram-negative bacteremia   Streptococcal bacteremia  Sepsis: Due to GBS bacteremia probably due to abdominal and right thigh cellulitis in patient with recurrent cellulitis. Proteus in BCID, not borne out in cultures felt to be insignificant per ID. - Improving, defervescing. Transition to amoxicillin per ID for 3 more days.   Acute hypoxic respiratory failure: Multifactorial, likely due to sepsis in patient prone to hypoventilation/OHS, asthma, and rhinovirus respiratory infection. ?Pulmonary fibrosis developing - Continue supplemental oxygen at discharge. Recheck at follow up.  - Continue bronchodilators at home.  CAD, chronic HFpEF: s/p DES and other PCI to RCA. Echo July 2019 with normal LV function, mild LAE. BNP mildly elevated here. Pt reports 50lbs weight gain in past 6 months. Weights from outpatient visits noted to have steadily increased from 135kg > 136kg in Oct > 141kg in Dec > 143kg in Jan > 144.8kg in late January and 147.7kg now (roughly 28lbs weight gain).  - Legs swollen, complicated picture with stasis. Lasix given with some improvement, brisk UOP. No crackles any longer on exam but suspect will need BID diuretic, prescribed  with supplemental K at discharge. - Continue  home ASA, plavix, beta blocker. Statin intolerant  Atrial fibrillation with RVR: Rate improving. Telemetry personally reviewed shows p waves occasionally. Due to uncertainty, strips reviewed electronically with Dr. Mayford Knife who agrees that this is AFib. Due to paroxysmal nature in setting of sepsis, does not recommend initiation of anticoagulation at this time.  - Continue metoprolol 37.5mg  BID (up from 25mg ) - Cardiology follow up with cardiac monitoring to determine AFib burden.  - CHA2DS2-VASc is 5; CHF (1), HTN (1), DM (1), Vasc Dx (CAD, PAD) (1), and sex (F = 1). This would indicate anticoagulation if deemed necessary by cardiology.  - Maintain K > 4, will supplement as diuretics augmented as well. Also keep Mg > 2, at goal today.   Hypothyroidism: Last TSH 1.388, TSH 0.624.  - Continue synthroid.  Hypokalemia, hypomagnesemia:  - Replace K with lasix and recheck at follow up.  IDT2DM with hyperglycemia: Significant resistance on U500 insulin per endocrinology as outpatient.  - Continue home medications  Hypertension - Holding Lasix and enalapril in the setting of volume depletion.  RA, MCTD:  - Continue home plaquenil  Chronic pain syndrome, fibromyalgia: - Continue oxycodone ER formulary equivalent and prn oxycodone.   Moderate persistent asthma - Resumed home treatments.  RLS:  - Continue home dose mirapex.   Venous stasis dermatitis: Chronic, stable. No complications at this time.  Morbid obesity:  - BMI 61, weight loss recommended  Discharge Instructions Discharge Instructions    Diet - low sodium heart healthy   Complete by:  As directed    Diet Carb Modified   Complete by:  As directed    Discharge instructions   Complete by:  As directed    - Continue taking amoxicillin three times daily for the next 3 days, you must complete the course - Follow up with cardiology as soon as possible for recheck of labs,  vital signs, and consideration of heart monitoring. In the meantime, take lasix twice daily and take potassium with each dose.  - To help control your heart rate, take a slightly higher dose of metoprolol (37.5mg  twice daily instead of 25mg  twice daily). New prescriptions were sent to your pharmacy - You may take robaxin as needed for muscle spasms but stop if you notice sedation as you are on many high risk medications. This is not expected to be a long term medication. - If your symptoms worsen, seek medical attention right away. Otherwise, follow up with cardiology as above, endocrinology as scheduled, and your PCP in the next 1-2 weeks.   Increase activity slowly   Complete by:  As directed      Allergies as of 10/21/2018      Reactions   Fish Allergy Anaphylaxis   INCLUDES OMEGA 3 OILS   Fish Oil Anaphylaxis, Swelling   THROAT SWELLS INCLUDES FISH AS A CLASS   Omega-3 Fatty Acids Swelling   Throat swelling  Throat swelling    Other Anaphylaxis, Other (See Comments), Nausea And Vomiting, Shortness Of Breath   UNSPECIFIED REACTION  Perch Other reaction(s): Other (See Comments) INCLUDES OMEGA 3 OILS Perch UNSPECIFIED REACTION  Perch   Crestor [rosuvastatin] Other (See Comments)   Extreme joint pain, to this & other statins   Gabapentin Anxiety   Anxiety on high doses   Lovastatin Other (See Comments)   EXTREME JOINT PAIN   Metronidazole Nausea Only, Swelling   Nausea and vomiting Other reaction(s): Other (See Comments) Headache and shakes Nausea and vomiting   Monascus Purpureus [employer]  Yeast Other (See Comments)   Muscle pain and severe joint pain.    Red Yeast Rice [cholestin] Other (See Comments)   Muscle pain and severe joint pain.    Rotigotine Swelling   Extreme edema   Atrovent Hfa [ipratropium Bromide Hfa] Other (See Comments)   wheezing   Suprep [na Sulfate-k Sulfate-mg Sulf] Nausea And Vomiting   Tape Other (See Comments)   Electrodes causes skin breakdown       Medication List    TAKE these medications   amoxicillin 500 MG capsule Commonly known as:  AMOXIL Take 1 capsule (500 mg total) by mouth every 8 (eight) hours.   aspirin EC 81 MG tablet Take 81 mg by mouth daily.   augmented betamethasone dipropionate 0.05 % cream Commonly known as:  DIPROLENE-AF APPLY 1 CREAM TOPICALLY ONCE DAILY AS NEEDED FOR  ECZEMA What changed:  See the new instructions.   BAYER CONTOUR TEST test strip Generic drug:  glucose blood 1 each 4 (four) times daily.   BREO ELLIPTA 200-25 MCG/INH Aepb Generic drug:  fluticasone furoate-vilanterol INHALE 1 PUFF BY MOUTH INTO THE LUNGS ONCE DAILY What changed:  See the new instructions.   clopidogrel 75 MG tablet Commonly known as:  PLAVIX Take 1 tablet (75 mg total) by mouth daily with breakfast.   diphenhydrAMINE 25 MG tablet Commonly known as:  BENADRYL Take 1 tablet (25 mg total) by mouth every 6 (six) hours as needed for itching or allergies.   enalapril 10 MG tablet Commonly known as:  VASOTEC Take 1 tablet (10 mg total) 2 (two) times daily by mouth.   EPINEPHrine 0.3 mg/0.3 mL Soaj injection Commonly known as:  EPIPEN 2-PAK USE AS DIRECTED FOR SEVERE ALLERGIC REACTION.   fluticasone 50 MCG/ACT nasal spray Commonly known as:  FLONASE USE 2 SPRAY(S) IN EACH NOSTRIL ONCE DAILY AS NEEDED FOR ALLERGIES OR  RHINITIS What changed:  See the new instructions.   furosemide 40 MG tablet Commonly known as:  LASIX Take 1.5 tablets (60 mg total) by mouth 2 (two) times daily for 30 days. What changed:  when to take this   gabapentin 100 MG capsule Commonly known as:  NEURONTIN Take 400-700 mg by mouth See admin instructions. Pt takes 400mg  in the morning, 400mg  at lunchtime and 700mg  in the evening   GLUCOMANNAN PO Take 700 mg by mouth 2 (two) times daily.   HUMULIN R 500 UNIT/ML injection Generic drug:  insulin regular human CONCENTRATED Inject 0-100 Units into the skin 4 (four) times daily -   before meals and at bedtime. Breakfast and lunch <100-125= 5 units 125-150= 35 u 151-200 = 45 u 201-249 = 55 u 250 - 300 = 65 u 301 - 349 = 75 u 350 - 400 = 85 u 401 - 449 = 95 u 450 - 500 = 105 u  bedtime doses <M100 - 150 = 10 u 151-200= 35 u 201 - 300 = 45u 301- 400 = 55 u 401 - 500 = 65 u   hydroxychloroquine 200 MG tablet Commonly known as:  PLAQUENIL Take 200 mg by mouth 2 (two) times daily.   ipratropium-albuterol 0.5-2.5 (3) MG/3ML Soln Commonly known as:  DUONEB USE 3 ML IN NEBULIZER  EVERY 4 HOURS AS NEEDED What changed:  See the new instructions.   levocetirizine 5 MG tablet Commonly known as:  XYZAL TAKE 1 TABLET BY MOUTH EVERY EVENING What changed:  when to take this   levothyroxine 200 MCG tablet Commonly known  as:  SYNTHROID, LEVOTHROID Take 200 mcg by mouth daily before breakfast. Except 100 mcg on Monday   LIPO-FLAVONOID PLUS PO Take 1 tablet by mouth 3 (three) times daily.   metFORMIN 500 MG 24 hr tablet Commonly known as:  GLUCOPHAGE-XR Take 2 tablets (1,000 mg total) by mouth 2 (two) times daily.   methocarbamol 500 MG tablet Commonly known as:  ROBAXIN Take 1 tablet (500 mg total) by mouth every 8 (eight) hours as needed for up to 3 days for muscle spasms.   metoprolol tartrate 25 MG tablet Commonly known as:  LOPRESSOR Take 1.5 tablets (37.5 mg total) by mouth 2 (two) times daily for 30 days. What changed:    how much to take  additional instructions   milk thistle 175 MG tablet Take 175 mg by mouth 2 (two) times daily.   MUCINEX MAXIMUM STRENGTH 1200 MG Tb12 Generic drug:  Guaifenesin Take 1,200 mg by mouth 2 (two) times daily.   nitroGLYCERIN 0.4 MG SL tablet Commonly known as:  NITROSTAT Place 1 tablet (0.4 mg total) under the tongue every 5 (five) minutes as needed for up to 25 doses for chest pain.   ondansetron 4 MG tablet Commonly known as:  ZOFRAN Take 4 mg by mouth every 8 (eight) hours as needed for nausea or  vomiting.   Oxycodone HCl 10 MG Tabs Take 1 tablet by mouth 3 (three) times daily as needed (pain).   pantoprazole 40 MG tablet Commonly known as:  PROTONIX TAKE 1 TABLET BY MOUTH ONCE DAILY   potassium chloride SA 20 MEQ tablet Commonly known as:  K-DUR,KLOR-CON Take 1 tablet (20 mEq total) by mouth 2 (two) times daily.   pramipexole 0.5 MG tablet Commonly known as:  MIRAPEX Take 0.5 mg by mouth 2 (two) times daily.   PREMARIN VA Place 1 application vaginally 3 (three) times a week. On Saturdays, Mondays, and Wednesday (cream)   PROAIR HFA 108 (90 Base) MCG/ACT inhaler Generic drug:  albuterol INHALE 2 PUFFS BY MOUTH EVERY 4 HOURS AS NEEDED FOR WHEEZING AND FOR SHORTNESS OF BREATH What changed:  See the new instructions.   promethazine 25 MG tablet Commonly known as:  PHENERGAN TAKE 1 TABLET BY MOUTH EVERY 8 HOURS AS NEEDED FOR NAUSEA AND FOR VOMITING What changed:  See the new instructions.   pyridoxine 100 MG tablet Commonly known as:  B-6 Take 100 mg by mouth 2 (two) times daily.   saccharomyces boulardii 250 MG capsule Commonly known as:  FLORASTOR Take 250 mg by mouth 3 (three) times daily.   silver sulfADIAZINE 1 % cream Commonly known as:  SILVADENE Apply 1 application topically daily.   SYSTANE ULTRA OP Apply 1 drop to eye daily as needed (dry eyes).   vitamin A [id number] UNIT capsule Take 10,000 Units by mouth daily.   vitamin B-12 1000 MCG tablet Commonly known as:  CYANOCOBALAMIN Take 1,000 mcg by mouth daily.   Vitamin D (Ergocalciferol) 1.25 MG (50000 UT) Caps capsule Commonly known as:  DRISDOL 50,000 Units every 7 (seven) days. Take on Friday What changed:  Another medication with the same name was removed. Continue taking this medication, and follow the directions you see here.   XTAMPZA ER 18 MG C12a Generic drug:  oxyCODONE ER Take 18 mg by mouth 2 times daily at 12 noon and 4 pm.            Durable Medical Equipment  (From admission,  onward)  Start     Ordered   10/21/18 1515  For home use only DME oxygen  Once    Question Answer Comment  Mode or (Route) Nasal cannula   Liters per Minute 3   Frequency Continuous (stationary and portable oxygen unit needed)   Oxygen conserving device No      10/21/18 1515         Follow-up Information    Sharlene Dory, DO. Schedule an appointment as soon as possible for a visit in 1 week(s).   Specialty:  Family Medicine Contact information: [address] [place] [address] [phone]        Quintella Reichert, MD. Schedule an appointment as soon as possible for a visit in 1 week(s).   Specialty:  Cardiology Contact information: [address] [place] [address] [phone]          Allergies  Allergen Reactions  . Fish Allergy Anaphylaxis    INCLUDES OMEGA 3 OILS  . Fish Oil Anaphylaxis and Swelling    THROAT SWELLS INCLUDES FISH AS A CLASS  . Omega-3 Fatty Acids Swelling    Throat swelling  Throat swelling   . Other Anaphylaxis, Other (See Comments), Nausea And Vomiting and Shortness Of Breath    UNSPECIFIED REACTION  Perch Other reaction(s): Other (See Comments) INCLUDES OMEGA 3 OILS Perch UNSPECIFIED REACTION  Perch  . Crestor [Rosuvastatin] Other (See Comments)    Extreme joint pain, to this & other statins  . Gabapentin Anxiety    Anxiety on high doses  . Lovastatin Other (See Comments)    EXTREME JOINT PAIN  . Metronidazole Nausea Only and Swelling    Nausea and vomiting  Other reaction(s): Other (See Comments) Headache and shakes Nausea and vomiting  . Monascus Purpureus [employer] Yeast Other (See Comments)    Muscle pain and severe joint pain.   . Red Yeast Rice [Cholestin] Other (See Comments)    Muscle pain and severe joint pain.   . Rotigotine Swelling    Extreme edema   . Atrovent Hfa [Ipratropium Bromide Hfa] Other (See Comments)    wheezing  . Suprep [Na Sulfate-K Sulfate-Mg  Sulf] Nausea And Vomiting  . Tape Other (See Comments)    Electrodes causes skin breakdown    Consultations:  ID  Procedures/Studies: Dg Chest 2 View  Result Date: 10/17/2018 CLINICAL DATA:  Cough, shortness of breath. EXAM: CHEST - 2 VIEW COMPARISON:  01/03/2018 FINDINGS: Rotated to the LEFT. Normal heart size and mediastinal contours. Azygos fissure noted. No definite infiltrate, pleural effusion or pneumothorax. Hazy opacity of the RIGHT hemithorax versus LEFT may be related to asymmetric superimposed soft tissues, without definite pleural effusion on lateral view. Bones demineralized. IMPRESSION: No definite acute abnormalities. Electronically Signed   By: Ulyses Southward M.D.   On: 10/17/2018 16:47   Ct Abdomen Pelvis W Contrast  Result Date: 10/17/2018 CLINICAL DATA:  58 year old female with abdominal pain and distension. Fever. EXAM: CT ABDOMEN AND PELVIS WITH CONTRAST TECHNIQUE: Multidetector CT imaging of the abdomen and pelvis was performed using the standard protocol following bolus administration of intravenous contrast. CONTRAST:  ISOVUE-300 IOPAMIDOL (ISOVUE-300) INJECTION 61% COMPARISON:  CT Abdomen and Pelvis 12/06/2008. FINDINGS: Lower chest: Stable, negative lung bases. Minimal chronic atelectasis or scarring in both middle lobes. No pericardial or pleural effusion. Hepatobiliary: Surgically absent gallbladder. Hepatic steatosis. No bile duct enlargement. Pancreas: Negative. Spleen: Negative. Adrenals/Urinary Tract: Normal adrenal glands. Bilateral renal enhancement and contrast  excretion is symmetric and normal. Decompressed proximal ureters. Diminutive and unremarkable urinary bladder. Stomach/Bowel: Negative rectosigmoid colon. The sigmoid is mildly redundant. Negative descending colon. Mildly redundant but negative transverse colon. Negative right colon and terminal ileum. Appendix is diminutive or absent. No large bowel inflammation. No dilated small bowel. Rectus muscle  diastasis but no abdominal hernia identified. No small bowel mesenteric stranding. Negative stomach and duodenum. No free air, free fluid. Vascular/Lymphatic: Aortoiliac calcified atherosclerosis. Suboptimal intravascular contrast bolus but the major arterial structures appear to remain patent. The portal venous system appears patent. No lymphadenopathy. Reproductive: Surgically absent uterus. Diminutive or absent ovaries. Other: Large body habitus. No pelvic free fluid. Partially visible abdominal panniculus with asymmetric subcutaneous stranding and some dystrophic calcifications (series 3, images 43 and 56. Musculoskeletal: Advanced lumbar disc, endplate and facet degeneration with progression since 2010. No acute osseous abnormality identified. IMPRESSION: 1. No acute or inflammatory process identified in the abdomen or pelvis. 2. Partially visible subcutaneous stranding of the left abdominal panniculus. Query Cellulitis. 3. Hepatic steatosis. 4.  Aortic Atherosclerosis (ICD10-I70.0). Electronically Signed   By: Odessa Fleming M.D.   On: 10/17/2018 19:00     Subjective: Feels better, tenderness/redness on right thigh nearly resolved. No fevers or chills. Feels short of breath but better than previously.   Discharge Exam: Vitals:   10/21/18 0936 10/21/18 1250  BP:  127/68  Pulse:  89  Resp:  (!) 21  Temp:  98.4 F (36.9 C)  SpO2: 93% 95%   General: Pt is alert, awake, not in acute distress Cardiovascular: RRR, S1/S2 +, no rubs, no gallops Respiratory: Nonlabored and clear.  Abdominal: Soft, NT, ND, bowel sounds + Extremities: 1+ pitting LE edema, no cyanosis. Skin: Demarcated area on right anterolateral thigh no longer with erythema, warmth or tenderness. No fluctuance.   Labs: BNP (last 3 results) Recent Labs    01/02/18 1809 10/17/18 1534  BNP 21.3 118.2*   Basic Metabolic Panel: Recent Labs  Lab 10/17/18 1534 10/17/18 2345 10/18/18 1043 10/19/18 0235 10/20/18 0250 10/21/18 0509   NA 137  --  135 136 137 137  K 4.2  --  3.9 4.1 3.6 3.5  CL 101  --  105 105 104 101  CO2 25  --  GLUCOSE 231*  --  252* 199* 90 154*  BUN 11  --  CREATININE 1.06*  --  0.91 0.93 0.95 0.86  CALCIUM 9.2  --  7.9* 8.2* 8.5* 8.4*  MG  --  1.2*  --  2.2 2.2  --   PHOS  --  3.2  --   --   --   --    Liver Function Tests: Recent Labs  Lab 10/17/18 1534 10/19/18 0235  AST 49* 39  ALT 42 33  ALKPHOS 46 43  BILITOT 0.6 0.6  PROT 6.7 6.3*  ALBUMIN 3.5 3.3*   Recent Labs  Lab 10/17/18 1534  LIPASE 33   No results for input(s): AMMONIA in the last 168 hours. CBC: Recent Labs  Lab 10/17/18 1534 10/18/18 1043 10/20/18 0250  WBC 15.5* 9.3 10.1  NEUTROABS 13.7*  --   --   HGB 13.2 11.0* 10.7*  HCT 42.8 36.5 36.4  MCV 89.9 94.1 94.5  PLT 179 132* 175   Cardiac Enzymes: No results for input(s): CKTOTAL, CKMB, CKMBINDEX, TROPONINI in the last 168 hours. BNP: Invalid input(s): POCBNP CBG: Recent Labs  Lab 10/20/18 1155 10/20/18 1703 10/20/18  2109 10/21/18 0751 10/21/18 1235  GLUCAP 253* 213* 153* 159* 188*   D-Dimer No results for input(s): DDIMER in the last 72 hours. Hgb A1c No results for input(s): HGBA1C in the last 72 hours. Lipid Profile No results for input(s): CHOL, HDL, LDLCALC, TRIG, CHOLHDL, LDLDIRECT in the last 72 hours. Thyroid function studies Recent Labs    10/20/18 0250  TSH 0.624   Anemia work up No results for input(s): VITAMINB12, FOLATE, FERRITIN, TIBC, IRON, RETICCTPCT in the last 72 hours. Urinalysis    Component Value Date/Time   COLORURINE AMBER (A) 10/18/2018 0421   APPEARANCEUR CLEAR 10/18/2018 0421   LABSPEC 1.028 10/18/2018 0421   PHURINE 5.0 10/18/2018 0421   GLUCOSEU NEGATIVE 10/18/2018 0421   HGBUR NEGATIVE 10/18/2018 0421   BILIRUBINUR NEGATIVE 10/18/2018 0421   KETONESUR 5 (A) 10/18/2018 0421   PROTEINUR NEGATIVE 10/18/2018 0421   UROBILINOGEN 0.2 02/10/2015 1835   NITRITE NEGATIVE 10/18/2018  0421   LEUKOCYTESUR NEGATIVE 10/18/2018 0421    Microbiology Recent Results (from the past 240 hour(s))  Blood Culture (routine x 2)     Status: Abnormal   Collection Time: 10/17/18  3:34 PM  Result Value Ref Range Status   Specimen Description   Final    BLOOD LEFT ANTECUBITAL Performed at [facility], [address] [place], [place] [address]    Special Requests   Final    BOTTLES DRAWN AEROBIC AND ANAEROBIC Blood Culture adequate volume Performed at [facility], [address] [place], [place] [address]    Culture  Setup Time   Final    IN BOTH AEROBIC AND ANAEROBIC BOTTLES GRAM POSITIVE COCCI IN CHAINS GRAM NEGATIVE RODS CRITICAL RESULT CALLED TO, READ BACK BY AND VERIFIED WITH: PHARMD J GADHIA [id number] MLM    Culture (A)  Final    GROUP B STREP(S.AGALACTIAE)ISOLATED PROTEUS SPECIES NOT RECOVERED IN CULTURE Performed at [facility], [address], [place], [place] [address]    Report Status 10/20/2018 FINAL  Final   Organism ID, Bacteria GROUP B STREP(S.AGALACTIAE)ISOLATED  Final      Susceptibility   Group b strep(s.agalactiae)isolated - MIC*    CLINDAMYCIN <=0.25 SENSITIVE Sensitive     AMPICILLIN <=0.25 SENSITIVE Sensitive     ERYTHROMYCIN <=0.12 SENSITIVE Sensitive     VANCOMYCIN 0.5 SENSITIVE Sensitive     CEFTRIAXONE <=0.12 SENSITIVE Sensitive     LEVOFLOXACIN 0.5 SENSITIVE Sensitive     PENICILLIN Value in next row Sensitive      SENSITIVE<=0.06    * GROUP B STREP(S.AGALACTIAE)ISOLATED  Group A Strep by PCR     Status: None   Collection Time: 10/17/18  3:34 PM  Result Value Ref Range Status   Group A Strep by PCR NOT DETECTED NOT DETECTED Final    Comment: Performed at [facility], [address] [place], [place] [address]  Blood Culture ID Panel (Reflexed)     Status: Abnormal   Collection Time: 10/17/18  3:34 PM  Result Value Ref Range Status   Enterococcus species NOT DETECTED NOT DETECTED Final    Listeria monocytogenes NOT DETECTED NOT DETECTED Final   Staphylococcus species NOT DETECTED NOT DETECTED Final   Staphylococcus aureus (BCID) NOT DETECTED NOT DETECTED Final   Streptococcus species DETECTED (A) NOT DETECTED Final    Comment: CRITICAL RESULT CALLED TO, READ BACK BY AND VERIFIED WITH: PHARMD J GADHIA [id number] MLM    Streptococcus agalactiae DETECTED (A)  NOT DETECTED Final    Comment: CRITICAL RESULT CALLED TO, READ BACK BY AND VERIFIED WITH: PHARMD J GADHIA [id number] MLM    Streptococcus pneumoniae NOT DETECTED NOT DETECTED Final   Streptococcus pyogenes NOT DETECTED NOT DETECTED Final   Acinetobacter baumannii NOT DETECTED NOT DETECTED Final   Enterobacteriaceae species DETECTED (A) NOT DETECTED Final    Comment: Enterobacteriaceae represent a large family of gram-negative bacteria, not a single organism. CRITICAL RESULT CALLED TO, READ BACK BY AND VERIFIED WITH: PHARMD J GADHIA [id number] MLM    Enterobacter cloacae complex NOT DETECTED NOT DETECTED Final   Escherichia coli NOT DETECTED NOT DETECTED Final   Klebsiella oxytoca NOT DETECTED NOT DETECTED Final   Klebsiella pneumoniae NOT DETECTED NOT DETECTED Final   Proteus species DETECTED (A) NOT DETECTED Final    Comment: CRITICAL RESULT CALLED TO, READ BACK BY AND VERIFIED WITH: PHARMD J GADHIA [phone]    Serratia marcescens NOT DETECTED NOT DETECTED Final   Carbapenem resistance NOT DETECTED NOT DETECTED Final   Haemophilus influenzae NOT DETECTED NOT DETECTED Final   Neisseria meningitidis NOT DETECTED NOT DETECTED Final   Pseudomonas aeruginosa NOT DETECTED NOT DETECTED Final   Candida albicans NOT DETECTED NOT DETECTED Final   Candida glabrata NOT DETECTED NOT DETECTED Final   Candida krusei NOT DETECTED NOT DETECTED Final   Candida parapsilosis NOT DETECTED NOT DETECTED Final   Candida tropicalis NOT DETECTED NOT DETECTED Final    Comment: Performed at [facility], [address] [place], [place] [address]  Blood Culture (routine x 2)     Status: None (Preliminary result)   Collection Time: 10/17/18  3:55 PM  Result Value Ref Range Status   Specimen Description   Final    BLOOD RIGHT ANTECUBITAL Performed at [facility] [place], [address] [place], [place] [address]    Special Requests   Final    BOTTLES DRAWN AEROBIC ONLY Blood Culture adequate volume Performed at [facility] [place], [address] [place], [place] [address]    Culture   Final    NO GROWTH 4 DAYS Performed at [facility], [address] [place], [place] [address]    Report Status PENDING  Incomplete  MRSA PCR Screening     Status: None   Collection Time: 10/17/18 10:23 PM  Result Value Ref Range Status   MRSA by PCR NEGATIVE NEGATIVE Final    Comment:        The GeneXpert MRSA Assay (FDA approved for NASAL specimens only), is one component of a comprehensive MRSA colonization surveillance program. It is not intended to diagnose MRSA infection nor to guide or monitor treatment for MRSA infections. Performed at [facility], [address] [place], [place] [address]   Respiratory Panel by PCR     Status: Abnormal   Collection Time: 10/18/18  1:01 AM  Result Value Ref Range Status   Adenovirus NOT DETECTED NOT DETECTED Final   Coronavirus 229E NOT DETECTED NOT DETECTED Final    Comment: (NOTE) The Coronavirus on the Respiratory Panel, DOES NOT test for the novel  Coronavirus (2019 nCoV)    Coronavirus HKU1 NOT DETECTED NOT DETECTED Final   Coronavirus NL63 NOT DETECTED NOT DETECTED Final   Coronavirus OC43 NOT DETECTED NOT DETECTED Final   Metapneumovirus NOT DETECTED NOT DETECTED Final   Rhinovirus / Enterovirus DETECTED (A) NOT DETECTED Final   Influenza A NOT DETECTED NOT  DETECTED Final   Influenza B NOT DETECTED NOT DETECTED Final   Parainfluenza Virus 1 NOT DETECTED NOT DETECTED Final   Parainfluenza Virus 2 NOT DETECTED NOT  DETECTED Final   Parainfluenza Virus 3 NOT DETECTED NOT DETECTED Final   Parainfluenza Virus 4 NOT DETECTED NOT DETECTED Final   Respiratory Syncytial Virus NOT DETECTED NOT DETECTED Final   Bordetella pertussis NOT DETECTED NOT DETECTED Final   Chlamydophila pneumoniae NOT DETECTED NOT DETECTED Final   Mycoplasma pneumoniae NOT DETECTED NOT DETECTED Final    Comment: Performed at [facility], [address] [place], [place] [address]  Culture, sputum-assessment     Status: None   Collection Time: 10/18/18  4:10 PM  Result Value Ref Range Status   Specimen Description EXPECTORATED SPUTUM  Final   Special Requests Normal  Final   Sputum evaluation   Final    Sputum specimen not acceptable for testing.  Please recollect.   RESULTS CALLED TO ABBY DICKIE,RN [id number] @ 1900 BY J SCOTTON Performed at [facility], [address] [place], [place] [address]    Report Status 10/18/2018 FINAL  Final    Time coordinating discharge: Approximately 40 minutes  Tyrone Nine, MD  [facility] 10/21/2018, 5:10 PM Pager [phone]

## 2018-10-21 NOTE — Evaluation (Signed)
Physical Therapy Evaluation Patient Details Name: [name] MRN: [id number] DOB: [birth year] Today's Date: [name]   History of Present Illness  58 y.o. female with medical history significant for CAD status post PCI, heart failure with preserved ejection fraction, hypertension, mixed connective tissue disorder, moderate persistent asthma, morbid obesity, IBS, chronic pain, type 2 diabetes mellitus with diabetic neuropathy, hypothyroidism and restless leg syndrome and admitted for recurrent LE cellulitis and also with Acute hypoxic respiratory failure: Multifactorial, likely due to sepsis in patient prone to hypoventilation/OHS, asthma, and rhinovirus respiratory infection as well as new-onset AFib  Clinical Impression  Pt admitted with above diagnosis. Pt currently with functional limitations due to the deficits listed below (see PT Problem List).  Pt will benefit from skilled PT to increase their independence and safety with mobility to allow discharge to the venue listed below.   Pt requiring supplemental oxygen for ambulation and educated on taking frequent rest breaks.  Pt reports she has a pulse oximeter at home and states MD would like her saturations 92% or above.  Pt does not typically wear oxygen at baseline and has dyspnea with very little activity.  SATURATION QUALIFICATIONS: (This note is used to comply with regulatory documentation for home oxygen)  Patient Saturations on Room Air at Rest = 85%  Patient Saturations on Room Air while Ambulating = n/a  Patient Saturations on 3 Liters of oxygen while Ambulating = 93%  Please briefly explain why patient needs home oxygen: to maintain oxygen saturations above 88% ar rest and with activity.     Follow Up Recommendations Home health PT    Equipment Recommendations  Other (comment)(may need supplemental oxygen)    Recommendations for Other Services       Precautions / Restrictions Precautions Precaution Comments: monitor  sats      Mobility  Bed Mobility               General bed mobility comments: pt sitting EOB on arrival  Transfers Overall transfer level: Needs assistance Equipment used: Rolling walker (2 wheeled) Transfers: Sit to/from Stand Sit to Stand: Min guard         General transfer comment: min/guard for safety with lines  Ambulation/Gait Ambulation/Gait assistance: Min guard Gait Distance (Feet): 200 Feet Assistive device: None Gait Pattern/deviations: Step-through pattern;Decreased stride length;Wide base of support     General Gait Details: verbal cues for taking rest breaks and pursed lip breathing, required 3 standing rest breaks and one seated rest break; SPO2 93% on 3L O2 [name]  Stairs            Wheelchair Mobility    Modified Rankin (Stroke Patients Only)       Balance Overall balance assessment: No apparent balance deficits (not formally assessed)(denies any falls)                                           Pertinent Vitals/Pain Pain Assessment: 0-10 Pain Score: 5  Pain Location: pt reports chronic 5-6/10 pain "everywhere" due to her PMHx Pain Descriptors / Indicators: Tender;Sore Pain Intervention(s): Premedicated before session;Monitored during session    Home Living Family/patient expects to be discharged to:: Private residence Living Arrangements: Spouse/significant other   Type of Home: House       Home Layout: Able to live on main level with bedroom/bathroom Home Equipment: Walker - 4 wheels  Prior Function Level of Independence: Independent         Comments: reports mostly household ambulator over the past year, typically does not wear oxygen     Hand Dominance        Extremity/Trunk Assessment        Lower Extremity Assessment Lower Extremity Assessment: Generalized weakness       Communication   Communication: No difficulties  Cognition Arousal/Alertness: Awake/alert Behavior During  Therapy: WFL for tasks assessed/performed Overall Cognitive Status: Within Functional Limits for tasks assessed                                        General Comments      Exercises     Assessment/Plan    PT Assessment Patient needs continued PT services  PT Problem List Decreased mobility;Decreased activity tolerance;Cardiopulmonary status limiting activity       PT Treatment Interventions DME instruction;Functional mobility training;Gait training;Therapeutic activities;Neuromuscular re-education;Balance training;Patient/family education;Therapeutic exercise;Stair training    PT Goals (Current goals can be found in the Care Plan section)  Acute Rehab PT Goals PT Goal Formulation: With patient Time For Goal Achievement: 10/28/18 Potential to Achieve Goals: Good    Frequency Min 3X/week   Barriers to discharge        Co-evaluation               AM-PAC PT "6 Clicks" Mobility  Outcome Measure Help needed turning from your back to your side while in a flat bed without using bedrails?: None Help needed moving from lying on your back to sitting on the side of a flat bed without using bedrails?: A Little Help needed moving to and from a bed to a chair (including a wheelchair)?: A Little Help needed standing up from a chair using your arms ([name].g., wheelchair or bedside chair)?: A Little Help needed to walk in hospital room?: A Little Help needed climbing 3-5 steps with a railing? : A Little 6 Click Score: 19    End of Session Equipment Utilized During Treatment: Gait belt;Oxygen Activity Tolerance: Patient tolerated treatment well Patient left: in bed;with call bell/phone within reach;with family/visitor present Nurse Communication: Mobility status PT Visit Diagnosis: Difficulty in walking, not elsewhere classified (R26.2)    Time: 1005-1026 PT Time Calculation (min) (ACUTE ONLY): 21 min   Charges:   PT Evaluation $PT Eval Low Complexity: 1 Low         [name], PT, DPT Acute Rehabilitation Services Office: [phone] Pager: [phone]  [name] [name], 1:01 PM

## 2018-10-22 LAB — CULTURE, BLOOD (ROUTINE X 2)
Culture: NO GROWTH
Special Requests: ADEQUATE

## 2018-10-25 ENCOUNTER — Ambulatory Visit: Payer: BLUE CROSS/BLUE SHIELD | Admitting: Family Medicine

## 2018-10-25 ENCOUNTER — Encounter: Payer: Self-pay | Admitting: Family Medicine

## 2018-10-25 VITALS — BP 108/76 | HR 96 | Temp 98.2°F | Ht 61.0 in | Wt 314.2 lb

## 2018-10-25 DIAGNOSIS — J9601 Acute respiratory failure with hypoxia: Secondary | ICD-10-CM | POA: Diagnosis not present

## 2018-10-25 DIAGNOSIS — L03115 Cellulitis of right lower limb: Secondary | ICD-10-CM | POA: Diagnosis not present

## 2018-10-25 LAB — BASIC METABOLIC PANEL
BUN: 10 mg/dL (ref 6–23)
CO2: 35 mEq/L — ABNORMAL HIGH (ref 19–32)
Calcium: 8.7 mg/dL (ref 8.4–10.5)
Chloride: 95 mEq/L — ABNORMAL LOW (ref 96–112)
Creatinine, Ser: 0.93 mg/dL (ref 0.40–1.20)
GFR: 62.07 mL/min (ref >60.00)
Glucose, Bld: 184 mg/dL — ABNORMAL HIGH (ref 70–99)
Potassium: 4 mEq/L (ref 3.5–5.1)
Sodium: 143 mEq/L (ref 135–145)

## 2018-10-25 LAB — CBC
HCT: 37.6 % (ref 36.0–46.0)
Hemoglobin: 12.3 g/dL (ref 12.0–15.0)
MCHC: 32.7 g/dL (ref 30.0–36.0)
MCV: 86.7 fl (ref 78.0–100.0)
Platelets: 297 10*3/uL (ref 150.0–400.0)
RBC: 4.33 Mil/uL (ref 3.87–5.11)
RDW: 14.8 % (ref 11.5–15.5)
WBC: 5.8 10*3/uL (ref 4.0–10.5)

## 2018-10-25 MED ORDER — AMOXICILLIN 500 MG PO CAPS: 500.0000 mg | ORAL_CAPSULE | Freq: Three times a day (TID) | ORAL | 0 refills | Status: DC

## 2018-10-25 MED ORDER — POTASSIUM CHLORIDE 40 MEQ/15ML (20%) PO SOLN: 20.0000 meq | Freq: Two times a day (BID) | ORAL | 1 refills | Status: DC

## 2018-10-25 MED ORDER — METHOCARBAMOL 750 MG PO TABS: 750.0000 mg | ORAL_TABLET | Freq: Three times a day (TID) | ORAL | 0 refills | Status: DC

## 2018-10-25 MED ORDER — MUPIROCIN 2 % EX OINT: 1.0000 "application " | TOPICAL_OINTMENT | Freq: Two times a day (BID) | CUTANEOUS | 0 refills | Status: DC

## 2018-10-25 MED ORDER — PROMETHAZINE HCL 25 MG PO TABS: ORAL_TABLET | ORAL | 0 refills | Status: DC

## 2018-10-25 NOTE — Progress Notes (Signed)
Chief Complaint  Patient presents with  . Follow-up    Subjective: Patient is a 58 y.o. female here for hosp f/u.  She is here with her husband.  Patient was recently admitted for lower extremity cellulitis, sepsis, and acute respiratory failure.  She was placed on intravenous antibiotics and recently finished an oral course of amoxicillin.  Infectious disease saw her but did not think it was necessary to see her outpatient.  She has an appointment with her cardiologist next week.  She was thought to have a short run of atrial fibrillation with rvr.  She is still having some shortness of breath but it is improved from the hospital stay.  She has been taking her diuretic and is lost around 12 pounds.  She is not taking her potassium because it upsets her stomach.  She is open to trying a liquid version.  Area on her right thigh where she had her cellulitis is improving, but is still hard and painful.  Denies fevers, vomiting, diarrhea, or spreading redness.   ROS: Heart: Denies chest pain  Lungs:+SOB   Past Medical History:  Diagnosis Date  . Asthma    "on daily RX and rescue inhaler" (04/18/2018)  . Chronic diastolic CHF (congestive heart failure) (HCC) 09/2015  . Chronic lower back pain   . Chronic neck pain   . Chronic pain syndrome    Fentanyl Patch  . Colon polyps   . Coronary artery disease    cath with normal LM, 30% LAD, 85% mid RCA and 95% distal RCA s/p PCI of the mid to distal RCA and now on DAPT with ASA and Ticagrelor.    . Eczema   . Excessive daytime sleepiness 11/26/2015  . Fibromyalgia   . Gallstones   . GERD (gastroesophageal reflux disease)   . Heart murmur    "noted for the 1st time on 04/18/2018"  . History of blood transfusion 07/2010   "S/P oophorectomy"  . History of gout   . History of hiatal hernia 1980s   "gone now" (04/18/2018)  . Hyperlipidemia   . Hypertension    takes Metoprolol and Enalapril daily  . Hypothyroidism    takes Synthroid daily  . IBS  (irritable bowel syndrome)   . Migraine    "nothing in the 2000s" (04/18/2018)  . Mixed connective tissue disease (HCC)   . NAFLD (nonalcoholic fatty liver disease)   . Pneumonia    "several times" (04/18/2018)  . Rheumatoid arthritis (HCC)    "hands, elbows, shoulders, probably knees" (04/18/2018)  . Scoliosis   . Spondylosis   . Type II diabetes mellitus (HCC)    takes Metformin and Hum R daily (04/18/2018)    Objective: BP 108/76 (BP Location: Left Arm, Patient Position: Sitting, Cuff Size: Large)   Pulse 96   Temp 98.2 F (36.8 C) (Oral)   Ht 5\' 1"  (1.549 m)   Wt (!) 314 lb 4 oz (142.5 kg)   SpO2 97%   BMI 59.38 kg/m  General: Awake, appears stated age HEENT: MMM, EOMi Heart: RRR, 2+ bilateral lower extremity edema tapering to the knees, no bruits Skin: Examined in the presence of a female chaperone; the anterior lateral right thigh has an area of induration without warmth, fluctuance, or erythema; it is mildly tender to palpation Lungs: CTAB, no rales, wheezes or rhonchi. No accessory muscle use Psych: Age appropriate judgment and insight, normal affect and mood  Assessment and Plan: Cellulitis of right lower extremity - Plan: amoxicillin (AMOXIL) 500  MG capsule, Basic metabolic panel, CBC  Acute respiratory failure with hypoxia (HCC) - Plan: mupirocin ointment (BACTROBAN) 2 %  3 days of amoxicillin.  Check labs. Wean down to 2 L nasal cannula and see how she does.  She has an appointment with her cardiologist next week. Mupirocin for sores in her nose from the oxygen. I will see her in 4 to 6 weeks. The patient voiced understanding and agreement to the plan.  [name] Rocheicholas Paul ArdsleyWendling, DO 10/25/18  9:04 AM

## 2018-10-25 NOTE — Patient Instructions (Signed)
Give [place] 2-3 business days to get the results of your labs back.   Stay active.  Elevate your legs.  3 more days of antibiotic.  Do not miss appointment with Dr. Mayford Knife.  Vaseline for the nose.  Let us know if you need anything.

## 2018-10-30 ENCOUNTER — Other Ambulatory Visit: Payer: Self-pay | Admitting: Family Medicine

## 2018-10-30 DIAGNOSIS — I10 Essential (primary) hypertension: Secondary | ICD-10-CM

## 2018-11-01 ENCOUNTER — Ambulatory Visit: Payer: BLUE CROSS/BLUE SHIELD | Admitting: Cardiology

## 2018-11-15 ENCOUNTER — Ambulatory Visit: Payer: BLUE CROSS/BLUE SHIELD | Admitting: Family Medicine

## 2018-11-28 ENCOUNTER — Other Ambulatory Visit: Payer: Self-pay | Admitting: Family Medicine

## 2018-11-28 DIAGNOSIS — I10 Essential (primary) hypertension: Secondary | ICD-10-CM

## 2018-11-28 DIAGNOSIS — J9601 Acute respiratory failure with hypoxia: Secondary | ICD-10-CM

## 2018-12-06 ENCOUNTER — Ambulatory Visit: Payer: BLUE CROSS/BLUE SHIELD | Admitting: Family Medicine

## 2018-12-30 ENCOUNTER — Other Ambulatory Visit: Payer: Self-pay | Admitting: Family Medicine

## 2018-12-30 DIAGNOSIS — I10 Essential (primary) hypertension: Secondary | ICD-10-CM

## 2019-01-02 ENCOUNTER — Telehealth: Payer: Self-pay | Admitting: Cardiology

## 2019-01-02 NOTE — Telephone Encounter (Signed)
New Message    Pt is calling because she doesn't know if she wants to come to her appt. She asked if the ekg that was done in the hospital can be gone over on the phone with her   Please call

## 2019-01-02 NOTE — Telephone Encounter (Signed)
Virtual Visit Pre-Appointment Phone Call  "(Name), I am calling you today to discuss your upcoming appointment. We are currently trying to limit exposure to the virus that causes COVID-19 by seeing patients at home rather than in the office."  1. "What is the BEST phone number to call the day of the visit?" - include this in appointment notes  2. "Do you have or have access to (through a family member/friend) a smartphone with video capability that we can use for your visit?" a. If yes - list this number in appt notes as "cell" (if different from BEST phone #) and list the appointment type as a VIDEO visit in appointment notes b. If no - list the appointment type as a PHONE visit in appointment notes  3. Confirm consent - "In the setting of the current Covid19 crisis, you are scheduled for a (phone or video) visit with your provider on (date) at (time).  Just as we do with many in-office visits, in order for you to participate in this visit, we must obtain consent.  If you'd like, I can send this to your mychart (if signed up) or email for you to review.  Otherwise, I can obtain your verbal consent now.  All virtual visits are billed to your insurance company just like a normal visit would be.  By agreeing to a virtual visit, we'd like you to understand that the technology does not allow for your provider to perform an examination, and thus may limit your provider's ability to fully assess your condition. If your provider identifies any concerns that need to be evaluated in person, we will make arrangements to do so.  Finally, though the technology is pretty good, we cannot assure that it will always work on either your or our end, and in the setting of a video visit, we may have to convert it to a phone-only visit.  In either situation, we cannot ensure that we have a secure connection.  Are you willing to proceed?" STAFF: Did the patient verbally acknowledge consent to telehealth visit? Document  YES/NO here: YES  4. Advise patient to be prepared - "Two hours prior to your appointment, go ahead and check your blood pressure, pulse, oxygen saturation, and your weight (if you have the equipment to check those) and write them all down. When your visit starts, your provider will ask you for this information. If you have an Apple Watch or Kardia device, please plan to have heart rate information ready on the day of your appointment. Please have a pen and paper handy nearby the day of the visit as well."  5. Give patient instructions for MyChart download to smartphone OR Doximity/Doxy.me as below if video visit (depending on what platform provider is using)  6. Inform patient they will receive a phone call 15 minutes prior to their appointment time (may be from unknown caller ID) so they should be prepared to answer    TELEPHONE CALL NOTE  [name] has been deemed a candidate for a follow-up tele-health visit to limit community exposure during the Covid-19 pandemic. I spoke with the patient via phone to ensure availability of phone/video source, confirm preferred email & phone number, and discuss instructions and expectations.  I reminded [name] to be prepared with any vital sign and/or heart rhythm information that could potentially be obtained via home monitoring, at the time of her visit. I reminded [name] to expect a phone call prior to  her visit.  Dustin Flock, RN 01/02/2019 2:55 PM   INSTRUCTIONS FOR DOWNLOADING THE MYCHART APP TO SMARTPHONE  - The patient must first make sure to have activated MyChart and know their login information - If Apple, go to [employer] and type in MyChart in the search bar and download the app. If Android, ask patient to go to [employer] and type in [place] in the search bar and download the app. The app is free but as with any other app downloads, their phone may require them to verify saved payment information or Apple/Android  password.  - The patient will need to then log into the app with their MyChart username and password, and select [facility] as their healthcare provider to link the account. When it is time for your visit, go to the MyChart app, find appointments, and click Begin Video Visit. Be sure to Select Allow for your device to access the Microphone and Camera for your visit. You will then be connected, and your provider will be with you shortly.  **If they have any issues connecting, or need assistance please contact MyChart service desk [phone])**  **If using a computer, in order to ensure the best quality for their visit they will need to use either of the following Internet Browsers: [employer] or Google Chrome**  IF USING DOXIMITY or DOXY.ME - The patient will receive a link just prior to their visit by text.     FULL LENGTH CONSENT FOR TELE-HEALTH VISIT   I hereby voluntarily request, consent and authorize [facility] and its employed or contracted physicians, physician assistants, nurse practitioners or other licensed health care professionals (the Practitioner), to provide me with telemedicine health care services (the "Services") as deemed necessary by the treating Practitioner. I acknowledge and consent to receive the Services by the Practitioner via telemedicine. I understand that the telemedicine visit will involve communicating with the Practitioner through live audiovisual communication technology and the disclosure of certain medical information by electronic transmission. I acknowledge that I have been given the opportunity to request an in-person assessment or other available alternative prior to the telemedicine visit and am voluntarily participating in the telemedicine visit.  I understand that I have the right to withhold or withdraw my consent to the use of telemedicine in the course of my care at any time, without affecting my right to future care or treatment,  and that the Practitioner or I may terminate the telemedicine visit at any time. I understand that I have the right to inspect all information obtained and/or recorded in the course of the telemedicine visit and may receive copies of available information for a reasonable fee.  I understand that some of the potential risks of receiving the Services via telemedicine include:  Marland Kitchen Delay or interruption in medical evaluation due to technological equipment failure or disruption; . Information transmitted may not be sufficient (e.g. poor resolution of images) to allow for appropriate medical decision making by the Practitioner; and/or  . In rare instances, security protocols could fail, causing a breach of personal health information.  Furthermore, I acknowledge that it is my responsibility to provide information about my medical history, conditions and care that is complete and accurate to the best of my ability. I acknowledge that Practitioner's advice, recommendations, and/or decision may be based on factors not within their control, such as incomplete or inaccurate data provided by me or distortions of diagnostic images or specimens that may result from electronic transmissions.  I understand that the practice of medicine is not an exact science and that Practitioner makes no warranties or guarantees regarding treatment outcomes. I acknowledge that I will receive a copy of this consent concurrently upon execution via email to the email address I last provided but may also request a printed copy by calling the office of [place].    I understand that my insurance will be billed for this visit.   I have read or had this consent read to me. . I understand the contents of this consent, which adequately explains the benefits and risks of the Services being provided via telemedicine.  . I have been provided ample opportunity to ask questions regarding this consent and the Services and have had my questions  answered to my satisfaction. . I give my informed consent for the services to be provided through the use of telemedicine in my medical care  By participating in this telemedicine visit I agree to the above.

## 2019-01-03 ENCOUNTER — Other Ambulatory Visit: Payer: Self-pay | Admitting: Family Medicine

## 2019-01-03 DIAGNOSIS — J9601 Acute respiratory failure with hypoxia: Secondary | ICD-10-CM

## 2019-01-03 DIAGNOSIS — L97901 Non-pressure chronic ulcer of unspecified part of unspecified lower leg limited to breakdown of skin: Secondary | ICD-10-CM

## 2019-01-06 DIAGNOSIS — I480 Paroxysmal atrial fibrillation: Secondary | ICD-10-CM | POA: Insufficient documentation

## 2019-01-06 NOTE — Progress Notes (Signed)
Virtual Visit via Video Note   This visit type was conducted due to national recommendations for restrictions regarding the COVID-19 Pandemic (e.g. social distancing) in an effort to limit this patient's exposure and mitigate transmission in our community.  Due to her co-morbid illnesses, this patient is at least at moderate risk for complications without adequate follow up.  This format is felt to be most appropriate for this patient at this time.  All issues noted in this document were discussed and addressed.  A limited physical exam was performed with this format.  Please refer to the patient's chart for her consent to telehealth for [facility].   Evaluation Performed:  Follow-up visit  This visit type was conducted due to national recommendations for restrictions regarding the COVID-19 Pandemic (e.g. social distancing).  This format is felt to be most appropriate for this patient at this time.  All issues noted in this document were discussed and addressed.  No physical exam was performed (except for noted visual exam findings with Video Visits).  Please refer to the patient's chart (MyChart message for video visits and phone note for telephone visits) for the patient's consent to telehealth for [facility].  Date:  01/07/2019   ID:  [name], DOB 06-01-1961, MRN [id number]  Patient Location:  Home  Provider location:   [place]  PCP:  Sharlene Dory, DO  Cardiologist:  Armanda Magic, MD Electrophysiologist:  None   Chief Complaint:  CAD  History of Present Illness:    [name] is a 58 y.o. female who presents via audio/video conferencing for a telehealth visit today.    [name] is a 58 y.o. female with a hx of ASCAD withprior cath showingnormal LM,30%, LAD,85% mid RCA and 95% distal RCA s/p PCI of the mid to distal RCA. 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  Repeat cath for CP and heartburn 04/19/18 showed severe stenosis of the ostial RCA, 99% stenosed, treated with successful PCI + DES placement. The previously placed stents in the mid and distal RCA were patent withonlyminimal stenosis. The LAD had only mild, 20%, disease. She was placed on DAPT w/ ASA and Plavix. Also continued on a BB and ARB.  She was referred to lipid clinic for consideration of PCSK9 inhibitor but this does not appear to have occurred.  She was admitted to the hospital on 10/17/2018 with fever, abdominal pain, nausea.  She had also recently been treated for left lower extremity cellulitis treated with doxycycline.  She also admitted to cough and productive sputum on his most recent hospital admission and was found to be in acute hypoxic respiratory failure felt secondary to sepsis in the setting of obesity hypoventilation syndrome and asthma as well as rhinovirus respiratory infection.  She was found to have Proteus and group B streptococcus bacteremia and was treated with antibiotics.  During her hospital stay with sepsis she developed an episode of A. fib with RVR overnight terminated abruptly to sinus rhythm and had no further A. fib.  She also had some mild volume overload with increased weight gain of 28 pounds from baseline.  She had lower extremity edema felt secondary mainly to chronic venous stasis but was given IV Lasix with improvement.  During  that admission her Lopressor was increased to 37.5 mg twice daily her CHADS2VASC score was 5 but due to the very brief nature of her A. fib in the setting of acute infection and respiratory failure it was recommended to hold off on anticoagulation unless she had further episodes of atrial fibrillation.  She is here today for  followup and is doing well.  She denies any chest pain or pressure, SOB, DOE, PND, orthopnea, LE edema, dizziness, palpitations or syncope. She is compliant with her meds and is tolerating meds with no SE.    The patient does not have symptoms concerning for COVID-19 infection (fever, chills, cough, or new shortness of breath).    Prior CV studies:   The following studies were reviewed today:  Cardiac cath 2019, 2D echo 03/2018  Past Medical History:  Diagnosis Date   Asthma    "on daily RX and rescue inhaler" (04/18/2018)   Chronic diastolic CHF (congestive heart failure) (HCC) 09/2015   Chronic lower back pain    Chronic neck pain    Chronic pain syndrome    Fentanyl Patch   Colon polyps    Coronary artery disease    cath with normal LM, 30% LAD, 85% mid RCA and 95% distal RCA s/p PCI of the mid to distal RCA and now on DAPT with ASA and Ticagrelor.     Eczema    Excessive daytime sleepiness 11/26/2015   Fibromyalgia    Gallstones    GERD (gastroesophageal reflux disease)    Heart murmur    "noted for the 1st time on 04/18/2018"   History of blood transfusion 07/2010   "S/P oophorectomy"   History of gout    History of hiatal hernia 1980s   "gone now" (04/18/2018)   Hyperlipidemia    Hypertension    takes Metoprolol and Enalapril daily   Hypothyroidism    takes Synthroid daily   IBS (irritable bowel syndrome)    Migraine    "nothing in the 2000s" (04/18/2018)   Mixed connective tissue disease (HCC)    NAFLD (nonalcoholic fatty liver disease)    Pneumonia    "several times" (04/18/2018)   Rheumatoid arthritis (HCC)    "hands, elbows, shoulders, probably knees" (04/18/2018)   Scoliosis    Spondylosis    Type II diabetes mellitus (HCC)    takes Metformin and Hum R daily (04/18/2018)   Past Surgical History:  Procedure Laterality Date   ABDOMINAL HYSTERECTOMY  06/2005   "w/right ovariy"   APPENDECTOMY     BREAST BIOPSY Bilateral    9 total  (04/18/2018)   CORONARY ANGIOPLASTY WITH STENT PLACEMENT  10/01/2015   normal LM, 30% LAD, 85% mid RCA and 95% distal RCA s/p PCI of the mid to distal RCA and now on DAPT with ASA and Ticagrelor.     CORONARY STENT INTERVENTION Right 04/18/2018   Procedure: CORONARY STENT INTERVENTION;  Surgeon: Kathleene HazelMcAlhany, Christopher D, MD;  Location: [facility] IN[facility]ASIVE CV [facility];  Service: Cardiovascular;  Laterality: Right;   DILATION AND CURETTAGE OF UTERUS     FRACTURE SURGERY     LAPAROSCOPIC CHOLECYSTECTOMY     LEFT HEART CATH AND CORONARY ANGIOGRAPHY N/A 04/18/2018   Procedure: LEFT HEART CATH AND CORONARY ANGIOGRAPHY;  Surgeon: Kathleene HazelMcAlhany, Christopher D, MD;  Location: [facility] INVASIVE CV LAB;  Service: Cardiovascular;  Laterality: N/A;   MUSCLE BIOPSY Left    "leg"   OOPHORECTOMY  07/2010   RADIOLOGY WITH ANESTHESIA N/A 05/25/2016   Procedure: RADIOLOGY WITH ANESTHESIA;  Surgeon: Medication Radiologist, MD;  Location: [facility] OR;  Service: Radiology;  Laterality: N/A;   TONSILLECTOMY AND ADENOIDECTOMY     WRIST FRACTURE SURGERY Left    "crushed it"     No outpatient medications have been marked as taking for the 01/07/19 encounter (Appointment) with Quintella Reichert, MD.     Allergies:   Fish allergy; Fish oil; Omega-3 fatty acids; Other; Crestor [rosuvastatin]; Gabapentin; Lovastatin; Metronidazole; Monascus purpureus went yeast; Red yeast rice [cholestin]; Rotigotine; Atrovent hfa [ipratropium bromide hfa]; Suprep [na sulfate-k sulfate-mg sulf]; and Tape   Social History   Tobacco Use   Smoking status: Former Smoker    Packs/day: 1.00    Years: 20.00    Pack years: 20.00    Types: Cigarettes    Last attempt to quit: 02/11/2004    Years since quitting: 14.9   Smokeless tobacco: Never Used  Substance Use Topics   Alcohol use: Yes    Comment: 04/18/2018 "couple drinks/year"   Drug use: Not Currently    Types: Marijuana    Comment: "only in my teens"     Family Hx: The patient's family history  includes Allergic rhinitis in her father; Asthma in her father; Breast cancer in her paternal aunt; CAD in her father and mother; Heart attack in her father and mother; Hypertension in her brother, brother, and mother; Lung cancer in her paternal aunt; Pancreatic cancer in her paternal aunt.  ROS:   Please see the history of present illness.     All other systems reviewed and are negative.   Labs/Other Tests and Data Reviewed:    Recent Labs: 10/17/2018: B Natriuretic Peptide 118.2 10/19/2018: ALT 33 10/20/2018: Magnesium 2.2; TSH 0.624 10/25/2018: BUN 10; Creatinine, Ser 0.93; Hemoglobin 12.3; Platelets 297.0; Potassium 4.0; Sodium 143   Recent Lipid Panel Lab Results  Component Value Date/Time   CHOL 217 (H) 04/08/2018 11:50 AM   TRIG 116 04/08/2018 11:50 AM   HDL 48 04/08/2018 11:50 AM   CHOLHDL 4.5 (H) 04/08/2018 11:50 AM   CHOLHDL 5.2 02/11/2015 03:10 AM   LDLCALC 146 (H) 04/08/2018 11:50 AM    Wt Readings from Last 3 Encounters:  10/25/18 (!) 314 lb 4 oz (142.5 kg)  10/21/18 (!) 325 lb 2.9 oz (147.5 kg)  10/03/18 (!) 319 lb 2 oz (144.8 kg)     Objective:    Vital Signs:  BP 138/57mmHg  CONSTITUTIONAL:  Well nourished, well developed female in no acute distress.  EYES: anicteric MOUTH: oral mucosa is pink RESPIRATORY: Normal respiratory effort, symmetric expansion CARDIOVASCULAR: No peripheral edema SKIN: No rash, lesions or ulcers MUSCULOSKELETAL: no digital cyanosis NEURO: Cranial Nerves II-XII grossly intact, moves all extremities PSYCH: Intact judgement and insight.  A&O x 3, Mood/affect appropriate   ASSESSMENT & PLAN:    1.  ASCAD -  cath 04/2018 showed severe stenosis of the ostial RCA, 99% stenosed, treated with successful PCI + DES placement. The previously placed stents in the mid and distal RCA were patent withonlyminimal stenosis. The LAD had only mild, 20%, disease. She has not had any further anginal symptoms.  She will continue on DAPT with aspirin 81  mg daily and Plavix 75 mg daily for minimum 1 year.  Stop lopressor due to asthma with increased wheezing.   2.  Hyperlipidemia -her LDL goal is less than 70.  Her LDL was 146 on 04/08/2018.  She is statin intolerant and was referred to lipid clinic in the past for consideration of PCSK9 inhibitor but  it does not appear that this is ever happened.  I will refer her back to lipid clinic.  3.  Hypertension  -she checks her blood pressure at home and has been controlled.  She will continue on enalapril 10 mg twice daily.  Her last creatinine was 0.93 on 10/25/2018.  Start cardizem CD 240mg  daily and stop Lopressor due to asthma exacerbation with increased wheezing on Lopressor. She will check her BP daily for a week and call with the results.   4.  Morbid Obesity  - I have encouraged her to get into a routine exercise program and cut back on carbs and portions.   5.  RA with MCTD - her last echo did not show any evidence of pulmonary HTN.   6.  Paroxysmal atrial fibrillation - This only occurred 1 time in the setting of acute respiratory failure and sepsis with bacteremia.  She has not had any recurrence of palpitations.  I am going to place a 30-day event monitor to see if she is having silent atrial fibrillation.  If no further arrhythmias then would not recommend starting anticoagulation unless she has recurrence of her atrial fibrillation since this occurred in the setting of sepsis. She had been on Lopressor but is having a lot of wheezing and asthma exacerbation.  I told her to stop her Lopressor and the Cardizem we are starting for HTN should help suppress any further PAF.    7.  Asthma - she has had increasing SOB due to asthma exacerbation and thinks that the lopressor is making her asthma worse.  I have instructed her to stop BB.  She is on Albuterol and said that Xopenex did not work.  I told her if her asthma did not improve off BB to call her PCP.  I am going to get her in to Pulmonary for  evaluation.   8.  COVID-19 Education: Connection the signs and symptoms of COVID-19 were discussed with the patient and how to seek care for testing (follow up with PCP or arrange E-visit).  The importance of social distancing was discussed today.  Patient Risk:   After full review of this patient's clinical status, I feel that they are at least moderate risk at this time.  Time:   Today, I have spent 25 minutes directly with the patient on video discussing medical problems including CAD, hyperlipidemia, HTN, obesity, PAR. We also reviewed the symptoms of COVID 19 and the ways to protect against contracting the virus with telehealth technology.  I spent an additional 15 minutes reviewing patient's chart including labs, cath, 2D echo, recent hospitalization 10/2018.  Medication Adjustments/Labs and Tests Ordered: Current medicines are reviewed at length with the patient today.  Concerns regarding medicines are outlined above.  Tests Ordered: No orders of the defined types were placed in this encounter.  Medication Changes: No orders of the defined types were placed in this encounter.   Disposition:  Follow up 4 weeks  Signed, Armanda Magicraci Shady Padron, MD  01/07/2019 9:57 AM    [facility]

## 2019-01-07 ENCOUNTER — Other Ambulatory Visit: Payer: Self-pay

## 2019-01-07 ENCOUNTER — Encounter: Payer: Self-pay | Admitting: Cardiology

## 2019-01-07 ENCOUNTER — Telehealth (INDEPENDENT_AMBULATORY_CARE_PROVIDER_SITE_OTHER): Payer: BLUE CROSS/BLUE SHIELD | Admitting: Cardiology

## 2019-01-07 VITALS — BP 138/68

## 2019-01-07 DIAGNOSIS — I10 Essential (primary) hypertension: Secondary | ICD-10-CM | POA: Diagnosis not present

## 2019-01-07 DIAGNOSIS — I480 Paroxysmal atrial fibrillation: Secondary | ICD-10-CM

## 2019-01-07 DIAGNOSIS — Z7189 Other specified counseling: Secondary | ICD-10-CM | POA: Diagnosis not present

## 2019-01-07 DIAGNOSIS — Z9861 Coronary angioplasty status: Secondary | ICD-10-CM

## 2019-01-07 DIAGNOSIS — I2510 Atherosclerotic heart disease of native coronary artery without angina pectoris: Secondary | ICD-10-CM | POA: Diagnosis not present

## 2019-01-07 DIAGNOSIS — M351 Other overlap syndromes: Secondary | ICD-10-CM

## 2019-01-07 DIAGNOSIS — J4541 Moderate persistent asthma with (acute) exacerbation: Secondary | ICD-10-CM

## 2019-01-07 DIAGNOSIS — E785 Hyperlipidemia, unspecified: Secondary | ICD-10-CM

## 2019-01-07 MED ORDER — DILTIAZEM HCL ER COATED BEADS 240 MG PO CP24: 240.0000 mg | ORAL_CAPSULE | Freq: Every day | ORAL | 3 refills | Status: DC

## 2019-01-07 NOTE — Patient Instructions (Addendum)
Medication Instructions:  Stop: Lopressor Start: Cardizem CD 240 mg, daily, by mouth  If you need a refill on your cardiac medications before your next appointment, please call your pharmacy.   Lab work: None If you have labs (blood work) drawn today and your tests are completely normal, you will receive your results only by: Marland Kitchen MyChart Message (if you have MyChart) OR . A paper copy in the mail If you have any lab test that is abnormal or we need to change your treatment, we will call you to review the results.  Testing/Procedures: Schedule a Conservator, museum/gallery, 14 days  Follow-Up: Your physician recommends that you schedule a follow-up appointment in: 4 weeks with Dr. Sherlyn Lick have been referred to Pulmonology with Dr. Marchelle Gearing for Asthma  You have been referred to Lipid Clinic for PCSK9 Inhibitors.   Check your blood pressure, 2 hours after taking for medication for 1 week and notify the office.

## 2019-01-09 ENCOUNTER — Telehealth: Payer: Self-pay | Admitting: *Deleted

## 2019-01-09 NOTE — Telephone Encounter (Signed)
14 day ZIO XT long term holter monitor will be mailed directly to house.  Instructions reviewed briefly as they are included in the monitor kit.

## 2019-01-14 ENCOUNTER — Encounter: Payer: Self-pay | Admitting: Family Medicine

## 2019-01-14 ENCOUNTER — Ambulatory Visit (INDEPENDENT_AMBULATORY_CARE_PROVIDER_SITE_OTHER): Payer: BLUE CROSS/BLUE SHIELD | Admitting: Family Medicine

## 2019-01-14 ENCOUNTER — Other Ambulatory Visit: Payer: Self-pay

## 2019-01-14 DIAGNOSIS — L03116 Cellulitis of left lower limb: Secondary | ICD-10-CM | POA: Diagnosis not present

## 2019-01-14 DIAGNOSIS — S91301A Unspecified open wound, right foot, initial encounter: Secondary | ICD-10-CM | POA: Diagnosis not present

## 2019-01-14 MED ORDER — FLUCONAZOLE 150 MG PO TABS: 150.0000 mg | ORAL_TABLET | Freq: Once | ORAL | 0 refills | Status: AC

## 2019-01-14 MED ORDER — DOXYCYCLINE HYCLATE 100 MG PO TABS: 100.0000 mg | ORAL_TABLET | Freq: Two times a day (BID) | ORAL | 0 refills | Status: DC

## 2019-01-14 NOTE — Progress Notes (Signed)
Chief Complaint  Patient presents with  . Follow-up    mychart message    [name] is a 58 y.o. female here for a skin complaint. Due to COVID-19 pandemic, we are interacting via web portal for an electronic face-to-face visit. I verified patient's ID using 2 identifiers. Patient agreed to proceed with visit via this method. Patient is at home, I am at home. Patient and I are present for visit.    Duration: 2 weeks Location: ant LLE Pruritic? No Painful? Yes Drainage? Yes New soaps/lotions/topicals/detergents? No Sick contacts? No Other associated symptoms: started as blisters, they unroofed and coalesced into current issue Therapies tried thus far: Silvadene, H2O2, mupirocin.   Hx of R foot wound that got worse with swelling also. Has been taking Lasix, but has cramps. Gatorade helps a little.   ROS:  Const: No fevers Skin: As noted in HPI  Past Medical History:  Diagnosis Date  . Asthma    "on daily RX and rescue inhaler" (04/18/2018)  . Chronic diastolic CHF (congestive heart failure) (HCC) 09/2015  . Chronic lower back pain   . Chronic neck pain   . Chronic pain syndrome    Fentanyl Patch  . Colon polyps   . Coronary artery disease    cath with normal LM, 30% LAD, 85% mid RCA and 95% distal RCA s/p PCI of the mid to distal RCA and now on DAPT with ASA and Ticagrelor.    . Eczema   . Excessive daytime sleepiness 11/26/2015  . Fibromyalgia   . Gallstones   . GERD (gastroesophageal reflux disease)   . Heart murmur    "noted for the 1st time on 04/18/2018"  . History of blood transfusion 07/2010   "S/P oophorectomy"  . History of gout   . History of hiatal hernia 1980s   "gone now" (04/18/2018)  . Hyperlipidemia   . Hypertension    takes Metoprolol and Enalapril daily  . Hypothyroidism    takes Synthroid daily  . IBS (irritable bowel syndrome)   . Migraine    "nothing in the 2000s" (04/18/2018)  . Mixed connective tissue disease (HCC)   . NAFLD (nonalcoholic  fatty liver disease)   . Pneumonia    "several times" (04/18/2018)  . Rheumatoid arthritis (HCC)    "hands, elbows, shoulders, probably knees" (04/18/2018)  . Scoliosis   . Spondylosis   . Type II diabetes mellitus (HCC)    takes Metformin and Hum R daily (04/18/2018)   Exam No conversational dyspnea Age appropriate judgment and insight Nml affect and mood See MyChart message of wound on LLE  Cellulitis of left lower extremity - Plan: doxycycline (VIBRA-TABS) 100 MG tablet, fluconazole (DIFLUCAN) 150 MG tablet  Wound of right foot - Plan: Ambulatory referral to Wound Clinic  1- tx with doxy, Diflucan should she have a yeast infection. Cont Lasix, consider pickle juice or mustard to help with cramps in addn to nml regimen.  2- Refer to wound team.  F/u prn. The patient voiced understanding and agreement to the plan.  Jilda Roche Ventura, DO 01/14/19 11:15 AM

## 2019-01-15 ENCOUNTER — Other Ambulatory Visit: Payer: Self-pay | Admitting: Family Medicine

## 2019-01-20 NOTE — Telephone Encounter (Signed)
BPs are stable.

## 2019-01-28 ENCOUNTER — Other Ambulatory Visit: Payer: Self-pay | Admitting: Cardiology

## 2019-01-28 ENCOUNTER — Other Ambulatory Visit: Payer: Self-pay | Admitting: Family Medicine

## 2019-01-28 DIAGNOSIS — J45909 Unspecified asthma, uncomplicated: Secondary | ICD-10-CM

## 2019-01-28 DIAGNOSIS — I10 Essential (primary) hypertension: Secondary | ICD-10-CM

## 2019-02-05 ENCOUNTER — Telehealth: Payer: BLUE CROSS/BLUE SHIELD | Admitting: Cardiology

## 2019-02-10 ENCOUNTER — Telehealth: Payer: BLUE CROSS/BLUE SHIELD | Admitting: Cardiology

## 2019-02-10 ENCOUNTER — Ambulatory Visit (INDEPENDENT_AMBULATORY_CARE_PROVIDER_SITE_OTHER): Payer: BC Managed Care – PPO

## 2019-02-10 DIAGNOSIS — I480 Paroxysmal atrial fibrillation: Secondary | ICD-10-CM | POA: Diagnosis not present

## 2019-02-23 ENCOUNTER — Other Ambulatory Visit: Payer: Self-pay | Admitting: Family Medicine

## 2019-02-23 DIAGNOSIS — J45909 Unspecified asthma, uncomplicated: Secondary | ICD-10-CM

## 2019-02-23 DIAGNOSIS — I10 Essential (primary) hypertension: Secondary | ICD-10-CM

## 2019-02-26 ENCOUNTER — Other Ambulatory Visit: Payer: Self-pay | Admitting: Family Medicine

## 2019-03-07 ENCOUNTER — Telehealth: Payer: Self-pay

## 2019-03-07 NOTE — Telephone Encounter (Signed)
Called patient to offer in office visit instead of video visit but she stated that because of her many health problems she would like to keep appointment as video visit. I explained that is fine and she will not be coming into the office for her appointment and that we would be calling her to do video visit appointment. Patient stated her understanding and thanked me for the call.

## 2019-03-16 NOTE — Progress Notes (Signed)
Virtual Visit via Video Note   This visit type was conducted due to national recommendations for restrictions regarding the COVID-19 Pandemic (e.g. social distancing) in an effort to limit this patient's exposure and mitigate transmission in our community.  Due to her co-morbid illnesses, this patient is at least at moderate risk for complications without adequate follow up.  This format is felt to be most appropriate for this patient at this time.  All issues noted in this document were discussed and addressed.  A limited physical exam was performed with this format.  Please refer to the patient's chart for her consent to telehealth for [facility].   Evaluation Performed:  Follow-up visit  This visit type was conducted due to national recommendations for restrictions regarding the COVID-19 Pandemic (e.g. social distancing).  This format is felt to be most appropriate for this patient at this time.  All issues noted in this document were discussed and addressed.  No physical exam was performed (except for noted visual exam findings with Video Visits).  Please refer to the patient's chart (MyChart message for video visits and phone note for telephone visits) for the patient's consent to telehealth for [facility].  Date:  03/17/2019   ID:  [name], DOB November 05, 1960, MRN [id number]  Patient Location:  Home  Provider location:   [place]  PCP:  Sharlene Dory, DO  Cardiologist: Armanda Magic, MD Electrophysiologist:  None   Chief Complaint:  CAD  History of Present Illness:    [name] is a 58 y.o. female who presents via audio/video conferencing for a telehealth visit today.    [name] M Riffellis a 58 y.o.femalewith a hx ofASCAD withprior cath showingnormal LM,30% LAD,85% mid RCA and 95% distal RCA s/p PCI of the mid to distal RCA. 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  Repeat cath for CP and heartburn 04/19/18 showed severe stenosis of the ostial RCA, 99% stenosed, treated with successful PCI + DES placement. The previously placed stents in the mid and distal RCA were patent withonlyminimal stenosis. The LAD had only mild, 20%, disease. She was placed on DAPT w/ ASA and Plavix. Also continued on a BB and ARB.  She was referred to lipid clinic for consideration of PCSK9 inhibitor but this does not appear to have occurred.  She also has a hx  of A. fib with RVR in the setting of sepsis and bacteremia and converted to NSR and has not had any further A. fib.  She was on Lopressor but no anticoagulation.  Her CHADS2VASC score was 5 but due to the very brief nature of her A. fib in the setting of acute infection and respiratory failure it was recommended to hold off on anticoagulation unless she had further episodes of atrial fibrillation.  Her BB was changed to Cardizem at last OV due to wheezing and asthma .  Since I saw her last she has had some problems with feeling fatigued during the day but has not been sleeping well at night due to her fibromyalgia and thinks it is related to that.   Her asthma has been acting up again and has had increased wheezing and SOB. She has had some mild chest discomfort but very short lived and not sure if  it is related to her fibromyalgia, asthma or cardiac.  She does not think that it is her heart.  She has had LE edema recently due to cellulitis but her edema has resolved.  She has not had any palpitations, dizziness, PND, orthopnea or syncope.   The patient does not have symptoms concerning for COVID-19 infection (fever, chills, cough, or new shortness of breath).    Prior CV studies:   The following studies were reviewed today:   none  Past Medical History:  Diagnosis Date  . Asthma    "on daily RX and rescue inhaler" (04/18/2018)  . Chronic diastolic CHF (congestive heart failure) ([place]) 09/2015  . Chronic lower back pain   . Chronic neck pain   . Chronic pain syndrome    Fentanyl Patch  . Colon polyps   . Coronary artery disease    cath with normal LM, 30% LAD, 85% mid RCA and 95% distal RCA s/p PCI of the mid to distal RCA and now on DAPT with ASA and Ticagrelor.    . Eczema   . Excessive daytime sleepiness 11/26/2015  . Fibromyalgia   . Gallstones   . GERD (gastroesophageal reflux disease)   . Heart murmur    "noted for the 1st time on 04/18/2018"  . History of blood transfusion 07/2010   "S/P oophorectomy"  . History of gout   . History of hiatal hernia 1980s   "gone now" (04/18/2018)  . Hyperlipidemia   . Hypertension    takes Metoprolol and Enalapril daily  . Hypothyroidism    takes Synthroid daily  . IBS (irritable bowel syndrome)   . Migraine    "nothing in the 2000s" (04/18/2018)  . Mixed connective tissue disease ([place])   . NAFLD (nonalcoholic fatty liver disease)   . Pneumonia    "several times" (04/18/2018)  . Rheumatoid arthritis ([place])    "hands, elbows, shoulders, probably knees" (04/18/2018)  . Scoliosis   . Spondylosis   . Type II diabetes mellitus ([place])    takes Metformin and Hum R daily (04/18/2018)   Past Surgical History:  Procedure Laterality Date  . ABDOMINAL HYSTERECTOMY  06/2005   "w/right ovariy"  . APPENDECTOMY    . BREAST BIOPSY Bilateral    9 total (04/18/2018)  . CORONARY ANGIOPLASTY WITH STENT PLACEMENT  10/01/2015   normal LM, 30% LAD, 85% mid RCA and 95% distal RCA s/p PCI of the mid to distal RCA and now on DAPT with ASA and Ticagrelor.    . CORONARY STENT INTERVENTION Right 04/18/2018   Procedure: CORONARY STENT INTERVENTION;  Surgeon: Burnell Blanks, MD;  Location: [place] [facility];  Service: Cardiovascular;  Laterality: Right;  . DILATION AND CURETTAGE OF  UTERUS    . FRACTURE SURGERY    . LAPAROSCOPIC CHOLECYSTECTOMY    . LEFT HEART CATH AND CORONARY ANGIOGRAPHY N/A 04/18/2018   Procedure: LEFT HEART CATH AND CORONARY ANGIOGRAPHY;  Surgeon: Burnell Blanks, MD;  Location: [place] [facility];  Service: Cardiovascular;  Laterality: N/A;  . MUSCLE BIOPSY Left    "leg"  . OOPHORECTOMY  07/2010  . RADIOLOGY WITH ANESTHESIA N/A 05/25/2016   Procedure: RADIOLOGY WITH ANESTHESIA;  Surgeon: Medication Radiologist, MD;  Location: [place];  Service: Radiology;  Laterality: N/A;  . TONSILLECTOMY AND ADENOIDECTOMY    . WRIST FRACTURE SURGERY Left    "crushed it"     Current Meds  Medication Sig  . aspirin EC 81 MG tablet Take 81 mg by mouth  daily.  . augmented betamethasone dipropionate (DIPROLENE-AF) 0.05 % cream APPLY 1 CREAM TOPICALLY ONCE DAILY AS NEEDED FOR  ECZEMA (Patient taking differently: Apply 1 application topically daily as needed (eczema). )  . BREO ELLIPTA 200-25 MCG/INH AEPB Inhale 1 puff by mouth once daily  . clopidogrel (PLAVIX) 75 MG tablet Take 1 tablet (75 mg total) by mouth daily with breakfast.  . diltiazem (CARDIZEM CD) 240 MG 24 hr capsule Take 1 capsule (240 mg total) by mouth daily.  . enalapril (VASOTEC) 10 MG tablet Take 1 tablet by mouth twice daily  . EPINEPHrine (EPIPEN 2-PAK) 0.3 mg/0.3 mL IJ SOAJ injection USE AS DIRECTED FOR SEVERE ALLERGIC REACTION.  . Estrogens, Conjugated (PREMARIN VA) Place 1 application vaginally 3 (three) times a week. On Saturdays, Mondays, and Wednesday (cream)  . fluticasone (FLONASE) 50 MCG/ACT nasal spray USE 2 SPRAY(S) IN EACH NOSTRIL ONCE DAILY AS NEEDED FOR  ALLERGIES  OR  RHINITIS  . gabapentin (NEURONTIN) 100 MG capsule Take 400-700 mg by mouth See admin instructions. Pt takes 400mg  in the morning, 400mg  at lunchtime and 700mg  in the evening  . GLUCOMANNAN PO Take 700 mg by mouth 2 (two) times daily.  Marland Kitchen. glucose blood (BAYER CONTOUR TEST) test strip 1 each 4 (four) times daily.   .  Guaifenesin (MUCINEX MAXIMUM STRENGTH) 1200 MG TB12 Take 1,200 mg by mouth 2 (two) times daily.  . hydroxychloroquine (PLAQUENIL) 200 MG tablet Take 200 mg by mouth 2 (two) times daily.  . insulin regular human CONCENTRATED (HUMULIN R) 500 UNIT/ML injection Inject 0-100 Units into the skin 4 (four) times daily -  before meals and at bedtime. Breakfast and lunch <100-125= 5 units 125-150= 35 u 151-200 = 45 u 201-249 = 55 u 250 - 300 = 65 u 301 - 349 = 75 u 350 - 400 = 85 u 401 - 449 = 95 u 450 - 500 = 105 u  bedtime doses <M100 - 150 = 10 u 151-200= 35 u 201 - 300 = 45u 301- 400 = 55 u 401 - 500 = 65 u  . ipratropium-albuterol (DUONEB) 0.5-2.5 (3) MG/3ML SOLN USE 3 ML IN NEBULIZER  EVERY 4 HOURS AS NEEDED (Patient taking differently: Inhale 3 mLs into the lungs every 4 (four) hours as needed (sob). )  . levocetirizine (XYZAL) 5 MG tablet TAKE 1 TABLET BY MOUTH EVERY EVENING (Patient taking differently: Take 5 mg by mouth daily. )  . levothyroxine (SYNTHROID, LEVOTHROID) 200 MCG tablet Take 200 mcg by mouth daily before breakfast. Except 100 mcg on Monday  . metFORMIN (GLUCOPHAGE-XR) 500 MG 24 hr tablet Take 2 tablets (1,000 mg total) by mouth 2 (two) times daily.  . methocarbamol (ROBAXIN) 750 MG tablet Take 1 tablet (750 mg total) by mouth 3 (three) times daily.  . milk thistle 175 MG tablet Take 175 mg by mouth 2 (two) times daily.   . mupirocin ointment (BACTROBAN) 2 % APPLY OINTMENT INTO THE NOSE 2(TWO) TIMES DAILY  . nitroGLYCERIN (NITROSTAT) 0.4 MG SL tablet Place 1 tablet (0.4 mg total) under the tongue every 5 (five) minutes as needed for up to 25 doses for chest pain.  . Oxycodone HCl 10 MG TABS Take 1 tablet by mouth 3 (three) times daily as needed (pain).   . pantoprazole (PROTONIX) 40 MG tablet Take 1 tablet by mouth once daily  . Polyethyl Glycol-Propyl Glycol (SYSTANE ULTRA OP) Apply 1 drop to eye daily as needed (dry eyes).  . Potassium Chloride 40 MEQ/15ML (  20%) SOLN Take  20 mEq by mouth 2 (two) times daily.  . pramipexole (MIRAPEX) 0.5 MG tablet Take 0.5 mg by mouth 2 (two) times daily.  Marland Kitchen PROAIR HFA 108 (90 Base) MCG/ACT inhaler INHALE 2 PUFFS BY MOUTH EVERY 4 HOURS AS NEEDED FOR WHEEZING AND FOR SHORTNESS OF BREATH (Patient taking differently: Inhale 2 puffs into the lungs every 4 (four) hours as needed for wheezing or shortness of breath. )  . promethazine (PHENERGAN) 25 MG tablet TAKE 1 TABLET BY MOUTH EVERY 8 HOURS AS NEEDED FOR NAUSEA AND FOR VOMITING  . pyridoxine (B-6) 100 MG tablet Take 100 mg by mouth 2 (two) times daily.  Marland Kitchen saccharomyces boulardii (FLORASTOR) 250 MG capsule Take 250 mg by mouth 3 (three) times daily.  . silver sulfADIAZINE (SILVADENE) 1 % cream APPLY  CREAM ONCE DAILY  . vitamin A 31540 UNIT capsule Take 10,000 Units by mouth daily.  . vitamin B-12 (CYANOCOBALAMIN) 1000 MCG tablet Take 1,000 mcg by mouth daily.    . Vitamin D, Ergocalciferol, (DRISDOL) 50000 units CAPS capsule 50,000 Units every 7 (seven) days. Take on Friday     Allergies:   Fish allergy, Fish oil, Omega-3 fatty acids, Other, Crestor [rosuvastatin], Gabapentin, Lovastatin, Metronidazole, Monascus purpureus went yeast, Red yeast rice [cholestin], Rotigotine, Atrovent hfa [ipratropium bromide hfa], Suprep [na sulfate-k sulfate-mg sulf], and Tape   Social History   Tobacco Use  . Smoking status: Former Smoker    Packs/day: 1.00    Years: 20.00    Pack years: 20.00    Types: Cigarettes    Quit date: 02/11/2004    Years since quitting: 15.1  . Smokeless tobacco: Never Used  Substance Use Topics  . Alcohol use: Yes    Comment: 04/18/2018 "couple drinks/year"  . Drug use: Not Currently    Types: Marijuana    Comment: "only in my teens"     Family Hx: The patient's family history includes Allergic rhinitis in her father; Asthma in her father; Breast cancer in her paternal aunt; CAD in her father and mother; Heart attack in her father and mother; Hypertension in her  brother, brother, and mother; Lung cancer in her paternal aunt; Pancreatic cancer in her paternal aunt.  ROS:   Please see the history of present illness.     All other systems reviewed and are negative.   Labs/Other Tests and Data Reviewed:    Recent Labs: 10/17/2018: B Natriuretic Peptide 118.2 10/19/2018: ALT 33 10/20/2018: Magnesium 2.2; TSH 0.624 10/25/2018: BUN 10; Creatinine, Ser 0.93; Hemoglobin 12.3; Platelets 297.0; Potassium 4.0; Sodium 143   Recent Lipid Panel Lab Results  Component Value Date/Time   CHOL 217 (H) 04/08/2018 11:50 AM   TRIG 116 04/08/2018 11:50 AM   HDL 48 04/08/2018 11:50 AM   CHOLHDL 4.5 (H) 04/08/2018 11:50 AM   CHOLHDL 5.2 02/11/2015 03:10 AM   LDLCALC 146 (H) 04/08/2018 11:50 AM    Wt Readings from Last 3 Encounters:  03/17/19 300 lb (136.1 kg)  10/25/18 (!) 314 lb 4 oz (142.5 kg)  10/21/18 (!) 325 lb 2.9 oz (147.5 kg)     Objective:    Vital Signs:  BP 125/69   Ht 5\' 1"  (1.549 m)   Wt 300 lb (136.1 kg)   BMI 56.68 kg/m    CONSTITUTIONAL:  Well nourished, well developed female in no acute distress.  EYES: anicteric MOUTH: oral mucosa is pink RESPIRATORY: Normal respiratory effort, symmetric expansion CARDIOVASCULAR: No peripheral edema SKIN: No rash, lesions or  ulcers MUSCULOSKELETAL: no digital cyanosis NEURO: Cranial Nerves II-XII grossly intact, moves all extremities PSYCH: Intact judgement and insight.  A&O x 3, Mood/affect appropriate   ASSESSMENT & PLAN:    1.  PAF - this occurred in the setting of sepsis and bacteremia and has not had any further occurrence.  30 day event monitor was ordered but never completed.  She was not placed on anticoagulation event with CHADS2VASC score of 5 since this occurred in the setting of acute illness.  Decision was to start DOAC if she had a reoccurrence. Her BB was stopped due to asthma and will continue on Cardizem CD 240mg  daily. I will reorder the event monitor to assess for silent afib.    2.  ASCAD - cath 04/2018 showedsevere stenosis of the ostial RCA, 99% stenosed, treated with successful PCI + DES placement. The previously placed stents in the mid and distal RCA were patent withonlyminimal stenosis. The LAD had only mild, 20%, disease.She has not had any anginal sx since her last cath but has had some mild fleeting chest discomfort that she thinks is more related to her asthma.  Given her morbid obesity and comorbidities, she is at high risk for complications of COVID if she contract the virus and therefore would like to try antianginal therapy first and getting asthma under control before considering bringing into office for stress test.  I will start Imdur 30mg  daily.  She will continue on ASA 81mg  daily, Plavix 75mg  daily.  Her BB was stopped due to asthma. She will followup with my extender in 2 weeks to see how she is doing.   3.  Hypertension - her BP has been controlled on current meds.  SHe will continue on Cardizem CD 240mg  daily and  Enalapril 10mg  BID  4.  Hyperlipidemia - she is statin intolerant.  SHe was referred back to lipid clinic for PCSK 9i but this has not occurred.  I will place another consult.     5.  RA with MCTD - no evidence of pulmonary HTN on last echo.   6.  Asthma - she has had more wheezing and SOB and I have instructed her to call her PCP today to let them know.  She is on inhalers for her asthma.   COVID-19 Education: The signs and symptoms of COVID-19 were discussed with the patient and how to seek care for testing (follow up with PCP or arrange E-visit).  The importance of social distancing was discussed today.  Patient Risk:   After full review of this patient's clinical status, I feel that they are at least moderate risk at this time.  Time:   Today, I have spent 25 minutes with telemedicine on video discussing medical problems including CAD, HTN, .  We also reviewed the symptoms of COVID 19 and the ways to protect against contracting the  virus with telehealth technology.  I spent an additional 5 minutes reviewing patient's chart including labs and prior OV notes.  Medication Adjustments/Labs and Tests Ordered: Current medicines are reviewed at length with the patient today.  Concerns regarding medicines are outlined above.  Tests Ordered: No orders of the defined types were placed in this encounter.  Medication Changes: No orders of the defined types were placed in this encounter.   Disposition:  Follow up in 2 week(s) video with extender  Signed, Armanda Magicraci Turner, MD  03/17/2019 9:35 AM    [facility]

## 2019-03-17 ENCOUNTER — Encounter: Payer: Self-pay | Admitting: Cardiology

## 2019-03-17 ENCOUNTER — Other Ambulatory Visit: Payer: Self-pay

## 2019-03-17 ENCOUNTER — Telehealth (INDEPENDENT_AMBULATORY_CARE_PROVIDER_SITE_OTHER): Payer: BC Managed Care – PPO | Admitting: Cardiology

## 2019-03-17 DIAGNOSIS — I2510 Atherosclerotic heart disease of native coronary artery without angina pectoris: Secondary | ICD-10-CM

## 2019-03-17 DIAGNOSIS — I480 Paroxysmal atrial fibrillation: Secondary | ICD-10-CM

## 2019-03-17 DIAGNOSIS — I119 Hypertensive heart disease without heart failure: Secondary | ICD-10-CM

## 2019-03-17 DIAGNOSIS — I10 Essential (primary) hypertension: Secondary | ICD-10-CM

## 2019-03-17 DIAGNOSIS — E785 Hyperlipidemia, unspecified: Secondary | ICD-10-CM

## 2019-03-17 DIAGNOSIS — Z9861 Coronary angioplasty status: Secondary | ICD-10-CM

## 2019-03-17 DIAGNOSIS — M351 Other overlap syndromes: Secondary | ICD-10-CM

## 2019-03-17 MED ORDER — ISOSORBIDE MONONITRATE ER 30 MG PO TB24: 30.0000 mg | ORAL_TABLET | Freq: Every day | ORAL | 3 refills | Status: DC

## 2019-03-17 NOTE — Patient Instructions (Signed)
Medication Instructions:  Start: Imdur 30 mg, daily, by mouth  If you need a refill on your cardiac medications before your next appointment, please call your pharmacy.   Lab work: None If you have labs (blood work) drawn today and your tests are completely normal, you will receive your results only by: Marland Kitchen MyChart Message (if you have MyChart) OR . A paper copy in the mail If you have any lab test that is abnormal or we need to change your treatment, we will call you to review the results.  Testing/Procedures: None  Follow-Up: 2 weeks with Ermalinda Barrios PA-C on 04/01/19.

## 2019-03-25 ENCOUNTER — Other Ambulatory Visit: Payer: Self-pay

## 2019-03-28 ENCOUNTER — Telehealth: Payer: Self-pay

## 2019-03-28 MED ORDER — DILTIAZEM HCL ER COATED BEADS 300 MG PO CP24: 300.0000 mg | ORAL_CAPSULE | Freq: Every day | ORAL | 3 refills | Status: DC

## 2019-03-28 NOTE — Telephone Encounter (Signed)
Spoke with the patient, she expressed understanding about increasing her Cardizem and has follow in 4 weeks.

## 2019-03-28 NOTE — Telephone Encounter (Signed)
-----   Message from Sueanne Margarita, MD sent at 03/25/2019  6:55 PM EDT ----- Event monitor showed NSR with occasional nonsustained atrial tachycardia - recommend increasing Cardizem to 300mg  daily.  Followup with PA in 3-4 weeks

## 2019-04-01 ENCOUNTER — Other Ambulatory Visit: Payer: Self-pay | Admitting: Family Medicine

## 2019-04-01 ENCOUNTER — Telehealth: Payer: BC Managed Care – PPO | Admitting: Physician Assistant

## 2019-04-01 ENCOUNTER — Encounter: Payer: Self-pay | Admitting: Family Medicine

## 2019-04-02 ENCOUNTER — Other Ambulatory Visit: Payer: Self-pay | Admitting: Cardiology

## 2019-04-02 ENCOUNTER — Ambulatory Visit (INDEPENDENT_AMBULATORY_CARE_PROVIDER_SITE_OTHER): Payer: BC Managed Care – PPO | Admitting: Family Medicine

## 2019-04-02 ENCOUNTER — Other Ambulatory Visit: Payer: Self-pay

## 2019-04-02 ENCOUNTER — Encounter: Payer: Self-pay | Admitting: Family Medicine

## 2019-04-02 ENCOUNTER — Other Ambulatory Visit: Payer: Self-pay | Admitting: Family Medicine

## 2019-04-02 VITALS — BP 124/74 | Temp 97.6°F

## 2019-04-02 DIAGNOSIS — L03116 Cellulitis of left lower limb: Secondary | ICD-10-CM

## 2019-04-02 DIAGNOSIS — J45909 Unspecified asthma, uncomplicated: Secondary | ICD-10-CM

## 2019-04-02 DIAGNOSIS — I10 Essential (primary) hypertension: Secondary | ICD-10-CM

## 2019-04-02 MED ORDER — CEPHALEXIN 500 MG PO CAPS: 500.0000 mg | ORAL_CAPSULE | Freq: Three times a day (TID) | ORAL | 0 refills | Status: AC

## 2019-04-02 MED ORDER — FLUCONAZOLE 150 MG PO TABS: ORAL_TABLET | ORAL | 0 refills | Status: DC

## 2019-04-02 NOTE — Progress Notes (Signed)
Chief Complaint  Patient presents with  . Cellulitis    [name] is a 58 y.o. female here for a skin complaint. Due to COVID-19 pandemic, we are interacting via web portal for an electronic face-to-face visit. I verified patient's ID using 2 identifiers. Patient agreed to proceed with visit via this method. Patient is at home, I am at office. Patient, spouse [name], and I are present for visit.   Duration: several weeks and worsening Location: both lower extremities Pruritic? No Painful? Yes Drainage? Yes- straw colored fluid New soaps/lotions/topicals/detergents? No Sick contacts? No Other associated symptoms: chronic swelling in LE's Therapies tried thus far: peroxide, TAO, dressing  ROS:  Const: No fevers Skin: As noted in HPI  Past Medical History:  Diagnosis Date  . Asthma    "on daily RX and rescue inhaler" (04/18/2018)  . Chronic diastolic CHF (congestive heart failure) ([place]) 09/2015  . Chronic lower back pain   . Chronic neck pain   . Chronic pain syndrome    Fentanyl Patch  . Colon polyps   . Coronary artery disease    cath with normal LM, 30% LAD, 85% mid RCA and 95% distal RCA s/p PCI of the mid to distal RCA and now on DAPT with ASA and Ticagrelor.    . Eczema   . Excessive daytime sleepiness 11/26/2015  . Fibromyalgia   . Gallstones   . GERD (gastroesophageal reflux disease)   . Heart murmur    "noted for the 1st time on 04/18/2018"  . History of blood transfusion 07/2010   "S/P oophorectomy"  . History of gout   . History of hiatal hernia 1980s   "gone now" (04/18/2018)  . Hyperlipidemia   . Hypertension    takes Metoprolol and Enalapril daily  . Hypothyroidism    takes Synthroid daily  . IBS (irritable bowel syndrome)   . Migraine    "nothing in the 2000s" (04/18/2018)  . Mixed connective tissue disease ([place])   . NAFLD (nonalcoholic fatty liver disease)   . Pneumonia    "several times" (04/18/2018)  . Rheumatoid arthritis ([place])    "hands, elbows,  shoulders, probably knees" (04/18/2018)  . Scoliosis   . Spondylosis   . Type II diabetes mellitus ([facility])    takes Metformin and Hum R daily (04/18/2018)    BP 124/74 (BP Location: Left Arm, Patient Position: Sitting, Cuff Size: Normal)   Temp 97.6 F (36.4 C) (Oral)  Gen: awake, alert, appearing stated age Lungs: No accessory muscle use Skin: erythematous with straw colored drainage over anterior LLE.  Psych: Age appropriate judgment and insight  Cellulitis of left lower extremity - Plan: cephALEXin (KEFLEX) 500 MG capsule, fluconazole (DIFLUCAN) 150 MG tablet, 14 d total, fluconazole just in case.   F/u prn. The patient voiced understanding and agreement to the plan.  [place], DO 04/02/19 4:38 PM

## 2019-04-23 ENCOUNTER — Encounter: Payer: Self-pay | Admitting: Physician Assistant

## 2019-04-23 ENCOUNTER — Other Ambulatory Visit: Payer: Self-pay

## 2019-04-23 ENCOUNTER — Telehealth (INDEPENDENT_AMBULATORY_CARE_PROVIDER_SITE_OTHER): Payer: BC Managed Care – PPO | Admitting: Physician Assistant

## 2019-04-23 VITALS — BP 124/61 | HR 99 | Ht 61.0 in | Wt 309.0 lb

## 2019-04-23 DIAGNOSIS — I10 Essential (primary) hypertension: Secondary | ICD-10-CM | POA: Diagnosis not present

## 2019-04-23 DIAGNOSIS — I480 Paroxysmal atrial fibrillation: Secondary | ICD-10-CM | POA: Diagnosis not present

## 2019-04-23 DIAGNOSIS — I471 Supraventricular tachycardia: Secondary | ICD-10-CM

## 2019-04-23 DIAGNOSIS — I2510 Atherosclerotic heart disease of native coronary artery without angina pectoris: Secondary | ICD-10-CM | POA: Diagnosis not present

## 2019-04-23 DIAGNOSIS — E785 Hyperlipidemia, unspecified: Secondary | ICD-10-CM

## 2019-04-23 MED ORDER — ENALAPRIL MALEATE 10 MG PO TABS: 10.0000 mg | ORAL_TABLET | Freq: Two times a day (BID) | ORAL | 3 refills | Status: DC

## 2019-04-23 NOTE — Progress Notes (Signed)
Virtual Visit via Video Note   This visit type was conducted due to national recommendations for restrictions regarding the COVID-19 Pandemic (e.g. social distancing) in an effort to limit this patient's exposure and mitigate transmission in our community.  Due to her co-morbid illnesses, this patient is at least at moderate risk for complications without adequate follow up.  This format is felt to be most appropriate for this patient at this time.  All issues noted in this document were discussed and addressed.  A limited physical exam was performed with this format.  Please refer to the patient's chart for her consent to telehealth for [facility].   Date:  04/23/2019   ID:  [name], DOB 28-Jul-1961, MRN [id number]  Patient Location: Home Provider Location: Home  PCP:  Shelda Pal, DO  Cardiologist:  No primary care provider on file.   Electrophysiologist:  None   Evaluation Performed:  Follow-Up Visit  Chief Complaint:  f/u  History of Present Illness:    [name] is a 58 y.o. female with history of CAD status post PCI to the mid distal RCA with residual 30% LAD DES to the proximal RCA 04/19/2018 for severe stenosis.  Previously placed stents in the mid and distal RCA were patent with minimal stenosis LAD had only mild 20% disease.  Also has history of PAF with RVR in the setting of sepsis and bacteremia converted to normal sinus rhythm [place] Vasc  is 5 but she was not anticoagulated because of the very brief nature of her atrial fibrillation and acute infection.  Her was changed to diltiazem because of wheezing and asthma.  Patient saw Dr. Radford Pax 03/17/2019 during a telemedicine visit at which time she ordered an event monitor to assess for silent A. Fib.  She was also having some fleeting chest discomfort that she felt was more related to her asthma but Dr. Radford Pax recommended Imdur 30 mg daily.  Event monitor showed normal sinus rhythm with occasional nonsustained  atrial tachycardia so Dr. Radford Pax increased her Cardizem to 300 mg daily.  Patient says she is doing much better on higher dose diltiazem. Was having fluttering several times a day and now it only comes once or twice a week. She has only fleeting sharp stabbing chest pain she thinks may be related to her asthma or RA. Not sure if Imdur has helped or not.   The patient does not have symptoms concerning for COVID-19 infection (fever, chills, cough, or new shortness of breath).    Past Medical History:  Diagnosis Date   Asthma    "on daily RX and rescue inhaler" (04/18/2018)   Chronic diastolic CHF (congestive heart failure) (HCC) 09/2015   Chronic lower back pain    Chronic neck pain    Chronic pain syndrome    Fentanyl Patch   Colon polyps    Coronary artery disease    cath with normal LM, 30% LAD, 85% mid RCA and 95% distal RCA s/p PCI of the mid to distal RCA and now on DAPT with ASA and Ticagrelor.     Eczema    Excessive daytime sleepiness 11/26/2015   Fibromyalgia    Gallstones    GERD (gastroesophageal reflux disease)    Heart murmur    "noted for the 1st time on 04/18/2018"   History of blood transfusion 07/2010   "S/P oophorectomy"   History of gout    History of hiatal hernia 1980s   "gone now" (04/18/2018)  Hyperlipidemia    Hypertension    takes Metoprolol and Enalapril daily   Hypothyroidism    takes Synthroid daily   IBS (irritable bowel syndrome)    Migraine    "nothing in the 2000s" (04/18/2018)   Mixed connective tissue disease (HCC)    NAFLD (nonalcoholic fatty liver disease)    Pneumonia    "several times" (04/18/2018)   Rheumatoid arthritis (HCC)    "hands, elbows, shoulders, probably knees" (04/18/2018)   Scoliosis    Spondylosis    Type II diabetes mellitus (HCC)    takes Metformin and Hum R daily (04/18/2018)   Past Surgical History:  Procedure Laterality Date   ABDOMINAL HYSTERECTOMY  06/2005   "w/right ovariy"   APPENDECTOMY      BREAST BIOPSY Bilateral    9 total (04/18/2018)   CORONARY ANGIOPLASTY WITH STENT PLACEMENT  10/01/2015   normal LM, 30% LAD, 85% mid RCA and 95% distal RCA s/p PCI of the mid to distal RCA and now on DAPT with ASA and Ticagrelor.     CORONARY STENT INTERVENTION Right 04/18/2018   Procedure: CORONARY STENT INTERVENTION;  Surgeon: [name], MD;  Location: [facility];  Service: Cardiovascular;  Laterality: Right;   DILATION AND CURETTAGE OF UTERUS     FRACTURE SURGERY     LAPAROSCOPIC CHOLECYSTECTOMY     LEFT HEART CATH AND CORONARY ANGIOGRAPHY N/A 04/18/2018   Procedure: LEFT HEART CATH AND CORONARY ANGIOGRAPHY;  Surgeon: [name], MD;  Location: [facility];  Service: Cardiovascular;  Laterality: N/A;   MUSCLE BIOPSY Left    "leg"   OOPHORECTOMY  07/2010   RADIOLOGY WITH ANESTHESIA N/A 05/25/2016   Procedure: RADIOLOGY WITH ANESTHESIA;  Surgeon: Medication Radiologist, MD;  Location: [facility] OR;  Service: Radiology;  Laterality: N/A;   TONSILLECTOMY AND ADENOIDECTOMY     WRIST FRACTURE SURGERY Left    "crushed it"     Current Meds  Medication Sig   aspirin EC 81 MG tablet Take 81 mg by mouth daily.   augmented betamethasone dipropionate (DIPROLENE-AF) 0.05 % cream APPLY 1 CREAM TOPICALLY ONCE DAILY AS NEEDED FOR  ECZEMA (Patient taking differently: Apply 1 application topically daily as needed (eczema). )   BREO ELLIPTA 200-25 MCG/INH AEPB Inhale 1 puff by mouth once daily   clopidogrel (PLAVIX) 75 MG tablet Take 1 tablet by mouth once daily with breakfast   diltiazem (CARDIZEM CD) 300 MG 24 hr capsule Take 1 capsule (300 mg total) by mouth daily.   enalapril (VASOTEC) 10 MG tablet Take 1 tablet (10 mg total) by mouth 2 (two) times daily.   EPINEPHrine (EPIPEN 2-PAK) 0.3 mg/0.3 mL IJ SOAJ injection USE AS DIRECTED FOR SEVERE ALLERGIC REACTION.   fluticasone (FLONASE) 50 MCG/ACT nasal spray USE 2 SPRAY(S) IN EACH NOSTRIL ONCE DAILY  AS NEEDED FOR  ALLERGIES  OR  RHINITIS   furosemide (LASIX) 40 MG tablet Take 1.5 tablets (60 mg total) by mouth 2 (two) times daily for 30 days. (Patient taking differently: Take 60 mg by mouth as needed. )   gabapentin (NEURONTIN) 100 MG capsule Take 400-700 mg by mouth See admin instructions. Pt takes 400mg  in the morning, 400mg  at lunchtime and 700mg  in the evening   GLUCOMANNAN PO Take 700 mg by mouth 2 (two) times daily.   glucose blood (BAYER CONTOUR TEST) test strip 1 each 4 (four) times daily.    Guaifenesin (MUCINEX MAXIMUM STRENGTH) 1200 MG TB12 Take 1,200 mg by mouth  2 (two) times daily.   hydroxychloroquine (PLAQUENIL) 200 MG tablet Take 200 mg by mouth 2 (two) times daily.   insulin regular human CONCENTRATED (HUMULIN R) 500 UNIT/ML injection Inject 0-100 Units into the skin 4 (four) times daily -  before meals and at bedtime. Breakfast and lunch <100-125= 5 units 125-150= 35 u 151-200 = 45 u 201-249 = 55 u 250 - 300 = 65 u 301 - 349 = 75 u 350 - 400 = 85 u 401 - 449 = 95 u 450 - 500 = 105 u  bedtime doses <M100 - 150 = 10 u 151-200= 35 u 201 - 300 = 45u 301- 400 = 55 u 401 - 500 = 65 u   ipratropium-albuterol (DUONEB) 0.5-2.5 (3) MG/3ML SOLN USE 3 ML IN NEBULIZER EVERY 4 HOURS AS NEEDED   isosorbide mononitrate (IMDUR) 30 MG 24 hr tablet Take 1 tablet (30 mg total) by mouth daily.   levocetirizine (XYZAL) 5 MG tablet TAKE 1 TABLET BY MOUTH EVERY EVENING   levothyroxine (SYNTHROID, LEVOTHROID) 200 MCG tablet Take 200 mcg by mouth daily before breakfast. Except 100 mcg on Monday   metFORMIN (GLUCOPHAGE-XR) 500 MG 24 hr tablet Take 2 tablets (1,000 mg total) by mouth 2 (two) times daily.   mupirocin ointment (BACTROBAN) 2 % APPLY OINTMENT INTO THE NOSE 2(TWO) TIMES DAILY   nitroGLYCERIN (NITROSTAT) 0.4 MG SL tablet Place 1 tablet (0.4 mg total) under the tongue every 5 (five) minutes as needed for up to 25 doses for chest pain.   pantoprazole (PROTONIX) 40 MG  tablet Take 1 tablet by mouth once daily   Polyethyl Glycol-Propyl Glycol (SYSTANE ULTRA OP) Apply 1 drop to eye daily as needed (dry eyes).   pramipexole (MIRAPEX) 0.5 MG tablet Take 0.5 mg by mouth 2 (two) times daily.   PROAIR HFA 108 (90 Base) MCG/ACT inhaler INHALE 2 PUFFS INTO LUNGS EVERY 4 HOURS AS NEEDED FOR WHEEZING AND FOR SHORTNESS OF BREATH   promethazine (PHENERGAN) 25 MG tablet TAKE 1 TABLET BY MOUTH EVERY 8 HOURS AS NEEDED FOR NAUSEA AND FOR VOMITING   pyridoxine (B-6) 100 MG tablet Take 100 mg by mouth 2 (two) times daily.   saccharomyces boulardii (FLORASTOR) 250 MG capsule Take 250 mg by mouth 3 (three) times daily.   silver sulfADIAZINE (SILVADENE) 1 % cream APPLY  CREAM ONCE DAILY   vitamin A 94709 UNIT capsule Take 10,000 Units by mouth daily.   vitamin B-12 (CYANOCOBALAMIN) 1000 MCG tablet Take 1,000 mcg by mouth daily.     Vitamin D, Ergocalciferol, (DRISDOL) 50000 units CAPS capsule 50,000 Units every 7 (seven) days. Take on Friday   [DISCONTINUED] enalapril (VASOTEC) 10 MG tablet Take 1 tablet by mouth twice daily   [DISCONTINUED] fluconazole (DIFLUCAN) 150 MG tablet Take 1 tab in event of yeast infection. Repeat in 48 hours if no improvement.   [DISCONTINUED] Oxycodone HCl 10 MG TABS Take 1 tablet by mouth 3 (three) times daily as needed (pain).      Allergies:   Fish allergy, Fish oil, Omega-3 fatty acids, Other, Crestor [rosuvastatin], Gabapentin, Lovastatin, Metronidazole, Monascus purpureus went yeast, Red yeast rice [cholestin], Rotigotine, Atrovent hfa [ipratropium bromide hfa], Suprep [na sulfate-k sulfate-mg sulf], and Tape   Social History   Tobacco Use   Smoking status: Former Smoker    Packs/day: 1.00    Years: 20.00    Pack years: 20.00    Types: Cigarettes    Quit date: 02/11/2004    Years since quitting:  15.2   Smokeless tobacco: Never Used  Substance Use Topics   Alcohol use: Yes    Comment: 04/18/2018 "couple drinks/year"   Drug  use: Not Currently    Types: Marijuana    Comment: "only in my teens"     Family Hx: The patient's family history includes Allergic rhinitis in her father; Asthma in her father; Breast cancer in her paternal aunt; CAD in her father and mother; Heart attack in her father and mother; Hypertension in her brother, brother, and mother; Lung cancer in her paternal aunt; Pancreatic cancer in her paternal aunt.  ROS:   Please see the history of present illness.      All other systems reviewed and are negative.   Prior CV studies:   The following studies were reviewed today:  30-day monitor 03/25/2019  Normal sinus rhythm and sinus tachycardia. The average heart rate was 93bpm and the heart rate ranged from 73 to 140bpm.  Rare PVC and ventricular couplet  Frequeng PACs and nonsustained atrial tachycardia  Blocked PAC    2D echo 7/30/2019Study Conclusions   - Procedure narrative: Transthoracic echocardiography. Image   quality was suboptimal. The study was technically difficult.   Intravenous contrast (Definity) was administered. - Left ventricle: The cavity size was normal. Systolic function was   normal. The estimated ejection fraction was in the range of 60%   to 65%. Wall motion was normal; there were no regional wall   motion abnormalities. Left ventricular diastolic function   parameters were normal. - Aortic valve: Trileaflet; mildly thickened, mildly calcified   leaflets. - Mitral valve: Poorly visualized. Calcified annulus. - Left atrium: The atrium was mildly dilated. - Tricuspid valve: Poorly visualized. - Pulmonic valve: Poorly visualized. - Pulmonary arteries: Systolic pressure could not be accurately   estimated.    Station 8/8/2019Mid RCA lesion is 10% stenosed.  Dist RCA lesion is 10% stenosed.  Ost RCA lesion is 99% stenosed.  Ost LAD to Prox LAD lesion is 30% stenosed.  A drug-eluting stent was successfully placed using a STENT SIERRA 3.00 X 12  MM.  Post intervention, there is a 0% residual stenosis.  Prox Cx to Mid Cx lesion is 20% stenosed.   1. Severe stenosis ostial RCA. 2. Patent stents mid and distal RCA with minimal restenosis.  3. Mild non-obstructive disease in the LAD and Circumflex 4. Successful PTCA/DES x 1 ostial RCA   Recommendations:  Recommend uninterrupted dual antiplatelet therapy with Aspirin 81mg  daily and Clopidogrel 75mg  daily for a minimum of 6 months (stable ischemic heart disease - Class I recommendation). If she tolerates, DAPT, would continue for one year.    Labs/Other Tests and Data Reviewed:    EKG:  30 day monitor reviewed  Recent Labs:  10/17/2018: B Natriuretic Peptide 118.2 10/19/2018: ALT 33 10/20/2018: Magnesium 2.2; TSH 0.624 10/25/2018: BUN 10; Creatinine, Ser 0.93; Hemoglobin 12.3; Platelets 297.0; Potassium 4.0; Sodium 143   Recent Lipid Panel Lab Results  Component Value Date/Time   CHOL 217 (H) 04/08/2018 11:50 AM   TRIG 116 04/08/2018 11:50 AM   HDL 48 04/08/2018 11:50 AM   CHOLHDL 4.5 (H) 04/08/2018 11:50 AM   CHOLHDL 5.2 02/11/2015 03:10 AM   LDLCALC 146 (H) 04/08/2018 11:50 AM    Wt Readings from Last 3 Encounters:  04/23/19 (!) 309 lb (140.2 kg)  03/17/19 300 lb (136.1 kg)  10/25/18 (!) 314 lb 4 oz (142.5 kg)     Objective:    Vital Signs:  BP 124/61  Pulse 99    Ht 5\' 1"  (1.549 m)    Wt (!) 309 lb (140.2 kg)    BMI 58.39 kg/m    VITAL SIGNS:  reviewed GEN:  no acute distress RESPIRATORY:  normal respiratory effort, symmetric expansion CARDIOVASCULAR:  no peripheral edema  ASSESSMENT & PLAN:    1. PAT much improved on higher dose diltiazem.  Having episodes about once or twice a week.  She will call us if she has any increase in symptoms 2. CAD DES pRCA 04/2018, stent mRCA & dRCA patent-atypical chest pain. 3. PAF  Occurred in the setting of sepsis without recurrence. Recent holter show PAT and doing well on higher diltiazem. CHADSVASC=5 but no anticoagulation  since only 1 episode 4. Essential hypertension controlled we will refill enalapril 5. Hyperlipidemia statin intolerant referred to lipid clinic for consideration of PCSK9 inhibitor  COVID-19 Education: The signs and symptoms of COVID-19 were discussed with the patient and how to seek care for testing (follow up with PCP or arrange E-visit).   The importance of social distancing was discussed today.  Time:   Today, I have spent 10:41 minutes with the patient with telehealth technology discussing the above problems.     Medication Adjustments/Labs and Tests Ordered: Current medicines are reviewed at length with the patient today.  Concerns regarding medicines are outlined above.   Tests Ordered: No orders of the defined types were placed in this encounter.   Medication Changes: Meds ordered this encounter  Medications   enalapril (VASOTEC) 10 MG tablet    Sig: Take 1 tablet (10 mg total) by mouth 2 (two) times daily.    Dispense:  180 tablet    Refill:  3    Follow Up:  Virtual Visit in 3 month(s) Dr. Mayford Knifeurner  Signed, Jacolyn ReedyMichele Twania Bujak, PA-C  04/23/2019 1:52 PM    [facility]

## 2019-04-23 NOTE — Patient Instructions (Addendum)
Medication Instructions:  Your physician recommends that you continue on your current medications as directed. Please refer to the Current Medication list given to you today.  If you need a refill on your cardiac medications before your next appointment, please call your pharmacy.   Lab work: None Ordered  If you have labs (blood work) drawn today and your tests are completely normal, you will receive your results only by: Marland Kitchen MyChart Message (if you have MyChart) OR . A paper copy in the mail If you have any lab test that is abnormal or we need to change your treatment, we will call you to review the results.  Testing/Procedures: None ordered  Follow-Up: . Follow up with Dr. Radford Pax via VIDEO Visit on 08/28/19 at 9:00 AM  Any Other Special Instructions Will Be Listed Below (If Applicable).

## 2019-05-02 ENCOUNTER — Institutional Professional Consult (permissible substitution): Payer: BC Managed Care – PPO | Admitting: Critical Care Medicine

## 2019-05-02 ENCOUNTER — Other Ambulatory Visit: Payer: Self-pay | Admitting: Family Medicine

## 2019-05-02 DIAGNOSIS — J45909 Unspecified asthma, uncomplicated: Secondary | ICD-10-CM

## 2019-05-07 ENCOUNTER — Other Ambulatory Visit: Payer: Self-pay | Admitting: Family Medicine

## 2019-05-07 DIAGNOSIS — J9601 Acute respiratory failure with hypoxia: Secondary | ICD-10-CM

## 2019-06-04 ENCOUNTER — Other Ambulatory Visit: Payer: Self-pay | Admitting: Family Medicine

## 2019-06-04 DIAGNOSIS — J45909 Unspecified asthma, uncomplicated: Secondary | ICD-10-CM

## 2019-06-19 ENCOUNTER — Other Ambulatory Visit: Payer: Self-pay | Admitting: Family Medicine

## 2019-06-30 ENCOUNTER — Encounter: Payer: Self-pay | Admitting: Family Medicine

## 2019-06-30 ENCOUNTER — Ambulatory Visit (INDEPENDENT_AMBULATORY_CARE_PROVIDER_SITE_OTHER): Payer: BC Managed Care – PPO | Admitting: Family Medicine

## 2019-06-30 ENCOUNTER — Other Ambulatory Visit: Payer: Self-pay

## 2019-06-30 DIAGNOSIS — L03116 Cellulitis of left lower limb: Secondary | ICD-10-CM

## 2019-06-30 MED ORDER — DOXYCYCLINE HYCLATE 100 MG PO TABS: 100.0000 mg | ORAL_TABLET | Freq: Two times a day (BID) | ORAL | 0 refills | Status: AC

## 2019-06-30 MED ORDER — FLUCONAZOLE 150 MG PO TABS: 150.0000 mg | ORAL_TABLET | Freq: Once | ORAL | 0 refills | Status: AC

## 2019-06-30 NOTE — Progress Notes (Signed)
Chief Complaint  Patient presents with  . Cellulitis    left leg  . Foot Pain    right foot    [name] is a 58 y.o. female here for a skin complaint. Due to COVID-19 pandemic, we are interacting via web portal for an electronic face-to-face visit. I verified patient's ID using 2 identifiers. Patient agreed to proceed with visit via this method. Patient is at home, I am at office. Patient, her husband and I are present for visit.   Duration: 2 weeks Location: LLE Pruritic? No Painful? Yes Drainage? Yes- straw colored Sick contacts? No Other associated symptoms: +redness +hx of cellulitis, abx help Therapies tried thus far: none  ROS:  Const: No fevers Skin: As noted in HPI  Past Medical History:  Diagnosis Date  . Asthma    "on daily RX and rescue inhaler" (04/18/2018)  . Chronic diastolic CHF (congestive heart failure) ([place]) 09/2015  . Chronic lower back pain   . Chronic neck pain   . Chronic pain syndrome    Fentanyl Patch  . Colon polyps   . Coronary artery disease    cath with normal LM, 30% LAD, 85% mid RCA and 95% distal RCA s/p PCI of the mid to distal RCA and now on DAPT with ASA and Ticagrelor.    . Eczema   . Excessive daytime sleepiness 11/26/2015  . Fibromyalgia   . Gallstones   . GERD (gastroesophageal reflux disease)   . Heart murmur    "noted for the 1st time on 04/18/2018"  . History of blood transfusion 07/2010   "S/P oophorectomy"  . History of gout   . History of hiatal hernia 1980s   "gone now" (04/18/2018)  . Hyperlipidemia   . Hypertension    takes Metoprolol and Enalapril daily  . Hypothyroidism    takes Synthroid daily  . IBS (irritable bowel syndrome)   . Migraine    "nothing in the 2000s" (04/18/2018)  . Mixed connective tissue disease ([place])   . NAFLD (nonalcoholic fatty liver disease)   . Pneumonia    "several times" (04/18/2018)  . Rheumatoid arthritis ([place])    "hands, elbows, shoulders, probably knees" (04/18/2018)  . Scoliosis   .  Spondylosis   . Type II diabetes mellitus (HCC)    takes Metformin and Hum R daily (04/18/2018)   Exam No conversational dyspnea Age appropriate judgment and insight Nml affect and mood RLE erythematous and w some serous drainage  Cellulitis of left lower extremity - Plan: doxycycline (VIBRA-TABS) 100 MG tablet, fluconazole (DIFLUCAN) 150 MG tablet  Orders as above. Depending on what podiatry says, may refer for recurrent cellulitis.  F/u prn. The patient voiced understanding and agreement to the plan.  [place], DO 06/30/19 11:36 AM

## 2019-07-01 ENCOUNTER — Other Ambulatory Visit: Payer: Self-pay | Admitting: Family Medicine

## 2019-07-01 DIAGNOSIS — J9601 Acute respiratory failure with hypoxia: Secondary | ICD-10-CM

## 2019-07-02 ENCOUNTER — Encounter: Payer: Self-pay | Admitting: Family Medicine

## 2019-07-17 ENCOUNTER — Other Ambulatory Visit: Payer: Self-pay | Admitting: Family Medicine

## 2019-07-23 ENCOUNTER — Encounter (HOSPITAL_BASED_OUTPATIENT_CLINIC_OR_DEPARTMENT_OTHER): Payer: BC Managed Care – PPO | Attending: Physician Assistant | Admitting: Physician Assistant

## 2019-07-23 ENCOUNTER — Other Ambulatory Visit: Payer: Self-pay

## 2019-07-23 DIAGNOSIS — Z881 Allergy status to other antibiotic agents status: Secondary | ICD-10-CM | POA: Diagnosis not present

## 2019-07-23 DIAGNOSIS — E669 Obesity, unspecified: Secondary | ICD-10-CM | POA: Insufficient documentation

## 2019-07-23 DIAGNOSIS — Z833 Family history of diabetes mellitus: Secondary | ICD-10-CM | POA: Insufficient documentation

## 2019-07-23 DIAGNOSIS — I110 Hypertensive heart disease with heart failure: Secondary | ICD-10-CM | POA: Diagnosis not present

## 2019-07-23 DIAGNOSIS — L97812 Non-pressure chronic ulcer of other part of right lower leg with fat layer exposed: Secondary | ICD-10-CM | POA: Insufficient documentation

## 2019-07-23 DIAGNOSIS — K219 Gastro-esophageal reflux disease without esophagitis: Secondary | ICD-10-CM | POA: Insufficient documentation

## 2019-07-23 DIAGNOSIS — Z87891 Personal history of nicotine dependence: Secondary | ICD-10-CM | POA: Diagnosis not present

## 2019-07-23 DIAGNOSIS — I5042 Chronic combined systolic (congestive) and diastolic (congestive) heart failure: Secondary | ICD-10-CM | POA: Insufficient documentation

## 2019-07-23 DIAGNOSIS — E1136 Type 2 diabetes mellitus with diabetic cataract: Secondary | ICD-10-CM | POA: Diagnosis not present

## 2019-07-23 DIAGNOSIS — E039 Hypothyroidism, unspecified: Secondary | ICD-10-CM | POA: Insufficient documentation

## 2019-07-23 DIAGNOSIS — M797 Fibromyalgia: Secondary | ICD-10-CM | POA: Insufficient documentation

## 2019-07-23 DIAGNOSIS — Z6841 Body Mass Index (BMI) 40.0 and over, adult: Secondary | ICD-10-CM | POA: Diagnosis not present

## 2019-07-23 DIAGNOSIS — J45909 Unspecified asthma, uncomplicated: Secondary | ICD-10-CM | POA: Diagnosis not present

## 2019-07-23 DIAGNOSIS — Z8249 Family history of ischemic heart disease and other diseases of the circulatory system: Secondary | ICD-10-CM | POA: Insufficient documentation

## 2019-07-23 DIAGNOSIS — L98492 Non-pressure chronic ulcer of skin of other sites with fat layer exposed: Secondary | ICD-10-CM | POA: Diagnosis not present

## 2019-07-23 DIAGNOSIS — Z888 Allergy status to other drugs, medicaments and biological substances status: Secondary | ICD-10-CM | POA: Diagnosis not present

## 2019-07-23 DIAGNOSIS — I890 Lymphedema, not elsewhere classified: Secondary | ICD-10-CM | POA: Insufficient documentation

## 2019-07-23 DIAGNOSIS — M069 Rheumatoid arthritis, unspecified: Secondary | ICD-10-CM | POA: Insufficient documentation

## 2019-07-23 DIAGNOSIS — L97412 Non-pressure chronic ulcer of right heel and midfoot with fat layer exposed: Secondary | ICD-10-CM | POA: Diagnosis present

## 2019-07-23 DIAGNOSIS — E11621 Type 2 diabetes mellitus with foot ulcer: Secondary | ICD-10-CM | POA: Diagnosis not present

## 2019-07-23 DIAGNOSIS — Z7984 Long term (current) use of oral hypoglycemic drugs: Secondary | ICD-10-CM | POA: Diagnosis not present

## 2019-07-23 DIAGNOSIS — I2510 Atherosclerotic heart disease of native coronary artery without angina pectoris: Secondary | ICD-10-CM | POA: Diagnosis not present

## 2019-07-23 DIAGNOSIS — Z955 Presence of coronary angioplasty implant and graft: Secondary | ICD-10-CM | POA: Insufficient documentation

## 2019-07-28 ENCOUNTER — Other Ambulatory Visit: Payer: Self-pay | Admitting: Family Medicine

## 2019-07-28 DIAGNOSIS — J45909 Unspecified asthma, uncomplicated: Secondary | ICD-10-CM

## 2019-07-28 NOTE — Progress Notes (Signed)
Nissan, [name] ([id number]) Visit Report for 07/23/2019 Abuse/Suicide Risk Screen Details Patient Name: Date of Service: [name], [name] 07/23/2019 2:45 PM Medical Record [id number] Patient Account Number: [id number] Date of Birth/Sex: Treating RN: 06-27-1961 (58 y.o. [name] Primary Care Sameul Tagle: [name] Other Clinician: Referring Breckyn Ticas: Treating Patsie Mccardle/Extender:Stone III, Luiz Blare, Archie Patten in Treatment: 0 Abuse/Suicide Risk Screen Items Answer ABUSE RISK SCREEN: Has anyone close to you tried to hurt or harm you recentlyo No Do you feel uncomfortable with anyone in your familyo No Has anyone forced you do things that you didnt want to doo No Electronic Signature(s) Signed: 07/28/2019 5:59:11 PM By: Levan Hurst RN, BSN Entered By: Levan Hurst on 07/23/2019 15:50:14 -------------------------------------------------------------------------------- Activities of Daily Living Details Patient Name: Date of Service: Kyte, [name] 07/23/2019 2:45 PM Medical Record [id number] Patient Account Number: [id number] Date of Birth/Sex: Treating RN: 20-Sep-1960 (58 y.o. [name] Primary Care Elmin Wiederholt: [name] Other Clinician: Referring Jacklyn Branan: Treating Cailey Trigueros/Extender:Stone III, Luiz Blare, Archie Patten in Treatment: 0 Activities of Daily Living Items Answer Activities of Daily Living (Please select one for each item) Drive Automobile Not Able Take Medications Completely Able Use Telephone Completely Able Care for Appearance Need Assistance Use Toilet Completely Able Bath / Shower Need Assistance Dress Self Need Assistance Feed Self Completely Able Walk Need Assistance Get In / Out Bed Need Assistance Housework Need Assistance Prepare Meals Need Assistance Handle Money Need Assistance Shop for Self Need Assistance Electronic Signature(s) Signed: 07/28/2019 5:59:11 PM By: Levan Hurst RN,  BSN Entered By: Levan Hurst on 07/23/2019 15:52:23 -------------------------------------------------------------------------------- Education Screening Details Patient Name: Date of Service: Route, Arelis M. 07/23/2019 2:45 PM Medical Record [id number] Patient Account Number: [id number] Date of Birth/Sex: Treating RN: 1960-10-29 (58 y.o. [name] Primary Care Addeline Calarco: [name] Other Clinician: Referring Dailyn Reith: Treating Wray Goehring/Extender:Stone III, Luiz Blare, Archie Patten in Treatment: 0 Primary Learner Assessed: Patient Learning Preferences/Education Level/Primary Language Learning Preference: Explanation, Demonstration, Printed Material Highest Education Level: College or Above Preferred Language: English Cognitive Barrier Language Barrier: No Translator Needed: No Memory Deficit: No Emotional Barrier: No Cultural/Religious Beliefs Affecting Medical Care: No Physical Barrier Impaired Vision: No Impaired Hearing: No Decreased Hand dexterity: No Knowledge/Comprehension Knowledge Level: High Comprehension Level: High Ability to understand written High instructions: Ability to understand verbal High instructions: Motivation Anxiety Level: Calm Cooperation: Cooperative Education Importance: Acknowledges Need Interest in Health Problems: Asks Questions Perception: Coherent Willingness to Engage in Self- High Management Activities: Readiness to Engage in Self- High Management Activities: Electronic Signature(s) Signed: 07/28/2019 5:59:11 PM By: Levan Hurst RN, BSN Entered By: Levan Hurst on 07/23/2019 15:52:41 -------------------------------------------------------------------------------- Fall Risk Assessment Details Patient Name: Date of Service: Burmester, [name] 07/23/2019 2:45 PM Medical Record [id number] Patient Account Number: [id number] Date of Birth/Sex: Treating RN: 1961-06-30 (58 y.o. [name] Primary Care Serrena Linderman: [name] Other Clinician: Referring Anneke Cundy: Treating Durand Wittmeyer/Extender:Stone III, Luiz Blare, Archie Patten in Treatment: 0 Fall Risk Assessment Items Have you had 2 or more falls in the last 12 monthso 0 No Have you had any fall that resulted in injury in the last 12 monthso 0 No FALLS RISK SCREEN History of falling - immediate or within 3 months 0 No Secondary diagnosis (Do you have 2 or more medical diagnoseso) 15 Yes Ambulatory aid None/bed rest/wheelchair/nurse 0 No Crutches/cane/walker 15 Yes Furniture 0 No Intravenous therapy Access/Saline/Heparin Lock 0 No Weak (short steps with or without shuffle, stooped but able to lift head 0 No while walking, may seek  support from furniture) Impaired (short steps with shuffle, may have difficulty arising from chair, 0 No head down, impaired balance) Mental Status Oriented to own ability 0 Yes Overestimates or forgets limitations 0 No Risk Level: Medium Risk Score: 30 Electronic Signature(s) Signed: 07/28/2019 5:59:11 PM By: Zandra Abts RN, BSN Entered By: Zandra Abts on 07/23/2019 15:52:52 -------------------------------------------------------------------------------- Foot Assessment Details Patient Name: Date of Service: Oboyle, [name] 07/23/2019 2:45 PM Medical Record [id number] Patient Account Number: [id number] Date of Birth/Sex: Treating RN: 1961-07-09 (58 y.o. Wynelle Link Primary Care Shirlena Brinegar: [name] Other Clinician: Referring Emiyah Spraggins: Treating Takeya Marquis/Extender:Stone III, Rochele Pages, Salvadore Dom in Treatment: 0 Foot Assessment Items Site Locations + = Sensation present, - = Sensation absent, C = Callus, U = Ulcer R = Redness, W = Warmth, M = Maceration, PU = Pre-ulcerative lesion F = Fissure, S = Swelling, D = Dryness Assessment Right: Left: Other Deformity: No No Prior Foot Ulcer: No No Prior Amputation: No No Charcot Joint:  No No Ambulatory Status: Ambulatory With Help Assistance Device: Walker G[employer] trader) Signed: 07/28/2019 5:59:11 PM By: Zandra Abts RN, BSN Entered By: Zandra Abts on 07/23/2019 15:53:21 -------------------------------------------------------------------------------- Nutrition Risk Screening Details Patient Name: Date of Service: Hentz, Neidy M. 07/23/2019 2:45 PM Medical Record [id number] Patient Account Number: [id number] Date of Birth/Sex: Treating RN: [birth year] (58 y.o. Wynelle Link Primary Care Jadda Hunsucker: [name] Other Clinician: Referring Malavika Lira: Treating Kotaro Buer/Extender:Stone III, Rochele Pages, Salvadore Dom in Treatment: 0 Height (in): 62 Weight (lbs): 300 Body Mass Index (BMI): 54.9 Nutrition Risk Screening Items Score Screening NUTRITION RISK SCREEN: I have an illness or condition that made me change the kind and/or 2 Yes amount of food I eat I eat fewer than two meals per day 0 No I eat few fruits and vegetables, or milk products 0 No I have three or more drinks of beer, liquor or wine almost every day 0 No I have tooth or mouth problems that make it hard for me to eat 0 No I don't always have enough money to buy the food I need 0 No I eat alone most of the time 0 No I take three or more different prescribed or over-the-counter drugs a day 1 Yes 0 No Without wanting to, I have lost or gained 10 pounds in the last six months I am not always physically able to shop, cook and/or feed myself 2 Yes Nutrition Protocols Good Risk Protocol Provide education on Moderate Risk Protocol 0 nutrition High Risk Proctocol Risk Level: Moderate Risk Score: 5 Electronic Signature(s) Signed: 07/28/2019 5:59:11 PM By: Zandra Abts RN, BSN Entered By: Zandra Abts on 07/23/2019 15:53:01

## 2019-07-30 ENCOUNTER — Telehealth (HOSPITAL_COMMUNITY): Payer: Self-pay | Admitting: *Deleted

## 2019-07-30 ENCOUNTER — Ambulatory Visit (HOSPITAL_BASED_OUTPATIENT_CLINIC_OR_DEPARTMENT_OTHER): Payer: BC Managed Care – PPO | Admitting: Physician Assistant

## 2019-07-30 NOTE — Telephone Encounter (Signed)
Attempted to reach pt to schedule reflux exam order by wound center.

## 2019-07-30 NOTE — Progress Notes (Signed)
[name], [name] ([id number]) Visit Report for 07/23/2019 Chief Complaint Document Details Patient Name: Date of Service: [name], [name] 07/23/2019 2:45 PM Medical Record [id number] Patient Account Number: [id number] Date of Birth/Sex: Treating RN: 12/19/1960 (58 y.o. [name] Primary Care Provider: [name] Other Clinician: Referring Provider: Treating Provider/Extender:Stone III, Luiz Blare, Archie Patten in Treatment: 0 Information Obtained from: Patient Chief Complaint Abdominal ulcers and bilateral LE lymphedema with right LE ulcers Electronic Signature(s) Signed: 07/23/2019 5:02:14 PM By: Worthy Keeler PA-C Entered By: Worthy Keeler on 07/23/2019 16:53:05 -------------------------------------------------------------------------------- Debridement Details Patient Name: Date of Service: [name], [name] 07/23/2019 2:45 PM Medical Record [id number] Patient Account Number: [id number] Date of Birth/Sex: 09-21-1960 (58 y.o. F) Treating RN: Baruch Gouty Primary Care Provider: [name] Other Clinician: Referring Provider: Treating Provider/Extender:Stone III, Luiz Blare, Archie Patten in Treatment: 0 Debridement Performed for Wound #1 Right Calcaneus Assessment: Performed By: Physician Worthy Keeler, PA Debridement Type: Debridement Severity of Tissue Pre Fat layer exposed Debridement: Level of Consciousness (Pre- Awake and Alert procedure): Pre-procedure Verification/Time Out Taken: Yes - 16:30 Start Time: 16:31 Pain Control: Other : bezocaine, 20% Total Area Debrided (L x W): 2 (cm) x 3 (cm) = 6 (cm) Tissue and other material Viable, Non-Viable, Callus, Slough, Subcutaneous, Skin: Epidermis, Biofilm, Slough debrided: Level: Skin/Subcutaneous Tissue Debridement Description: Excisional Instrument: Curette Bleeding: Minimum Hemostasis Achieved: Pressure End Time: 16:35 Procedural Pain: 2 Post Procedural  Pain: 0 Response to Treatment: Procedure was tolerated well Level of Consciousness Awake and Alert (Post-procedure): Post Debridement Measurements of Total Wound Length: (cm) 1.3 Width: (cm) 2.2 Depth: (cm) 0.1 Volume: (cm) 0.225 Character of Wound/Ulcer Post Improved Debridement: Severity of Tissue Post Debridement: Fat layer exposed Post Procedure Diagnosis Same as Pre-procedure Electronic Signature(s) Signed: 07/23/2019 5:02:14 PM By: Worthy Keeler PA-C Signed: 07/23/2019 5:49:26 PM By: Baruch Gouty RN, BSN Entered By: Baruch Gouty on 07/23/2019 16:34:16 -------------------------------------------------------------------------------- Debridement Details Patient Name: Date of Service: [name], [name] 07/23/2019 2:45 PM Medical Record [id number] Patient Account Number: [id number] Date of Birth/Sex: 23-Apr-1961 (58 y.o. F) Treating RN: Baruch Gouty Primary Care Provider: [name] Other Clinician: Referring Provider: Treating Provider/Extender:Stone III, Luiz Blare, Archie Patten in Treatment: 0 Debridement Performed for Wound #5 Right,Lateral Abdomen - Lower Quadrant Assessment: Performed By: Physician Worthy Keeler, PA Debridement Type: Debridement Level of Consciousness (Pre- Awake and Alert procedure): Pre-procedure Verification/Time Out Taken: Yes - 16:30 Start Time: 16:36 Pain Control: Other : bezocaine, 20% Total Area Debrided (L x W): 1.2 (cm) x 1 (cm) = 1.2 (cm) Tissue and other material Viable, Non-Viable, Slough, Subcutaneous, Biofilm, Slough debrided: Level: Skin/Subcutaneous Tissue Debridement Description: Excisional Instrument: Curette Bleeding: Minimum Hemostasis Achieved: Pressure End Time: 16:38 Procedural Pain: 2 Post Procedural Pain: 0 Response to Treatment: Procedure was tolerated well Level of Consciousness Awake and Alert (Post-procedure): Post Debridement Measurements of Total Wound Length: (cm)  1.2 Width: (cm) 1 Depth: (cm) 0.1 Volume: (cm) 0.094 Character of Wound/Ulcer Post Improved Debridement: Post Procedure Diagnosis Same as Pre-procedure Electronic Signature(s) Signed: 07/23/2019 5:02:14 PM By: Worthy Keeler PA-C Signed: 07/23/2019 5:49:26 PM By: Baruch Gouty RN, BSN Entered By: Baruch Gouty on 07/23/2019 16:37:40 -------------------------------------------------------------------------------- Debridement Details Patient Name: Date of Service: [name], [name] 07/23/2019 2:45 PM Medical Record [id number] Patient Account Number: [id number] Date of Birth/Sex: [birth year] (58 y.o. F) Treating RN: Baruch Gouty Primary Care Provider: [name] Other Clinician: Referring Provider: Treating Provider/Extender:Stone III, Luiz Blare, Archie Patten in Treatment: 0 Debridement Performed for Wound #3 Proximal  Abdomen - midline Assessment: Performed By: Physician Worthy Keeler, PA Debridement Type: Debridement Level of Consciousness (Pre- Awake and Alert procedure): Pre-procedure Verification/Time Out Taken: Yes - 16:30 Start Time: 16:31 Pain Control: Other : bezocaine, 20% Total Area Debrided (L x W): 0.8 (cm) x 0.8 (cm) = 0.64 (cm) Tissue and other material Viable, Non-Viable, Slough, Subcutaneous, Biofilm, Slough debrided: Level: Skin/Subcutaneous Tissue Debridement Description: Excisional Instrument: Curette Bleeding: Minimum Hemostasis Achieved: Pressure End Time: 16:35 Procedural Pain: 2 Post Procedural Pain: 0 Response to Treatment: Procedure was tolerated well Level of Consciousness Awake and Alert (Post-procedure): Post Debridement Measurements of Total Wound Length: (cm) 0.8 Width: (cm) 0.8 Depth: (cm) 0.1 Volume: (cm) 0.05 Character of Wound/Ulcer Post Improved Debridement: Post Procedure Diagnosis Same as Pre-procedure Electronic Signature(s) Signed: 07/23/2019 5:02:14 PM By: Worthy Keeler PA-C Signed:  07/23/2019 5:49:26 PM By: Baruch Gouty RN, BSN Entered By: Baruch Gouty on 07/23/2019 16:38:12 -------------------------------------------------------------------------------- Debridement Details Patient Name: Date of Service: [name], [name] 07/23/2019 2:45 PM Medical Record [id number] Patient Account Number: [id number] Date of Birth/Sex: 02-02-1961 (58 y.o. F) Treating RN: Baruch Gouty Primary Care Provider: [name] Other Clinician: Referring Provider: Treating Provider/Extender:Stone III, Luiz Blare, Archie Patten in Treatment: 0 Debridement Performed for Wound #4 Right,Medial Abdomen - Lower Quadrant Assessment: Performed By: Physician Worthy Keeler, PA Debridement Type: Debridement Level of Consciousness (Pre- Awake and Alert procedure): Pre-procedure Verification/Time Out Taken: Yes - 16:30 Start Time: 16:31 Pain Control: Other : bezocaine, 20% Total Area Debrided (L x W): 1.6 (cm) x 1.5 (cm) = 2.4 (cm) Tissue and other material Viable, Non-Viable, Slough, Subcutaneous, Biofilm, Slough debrided: Level: Skin/Subcutaneous Tissue Debridement Description: Excisional Instrument: Curette Bleeding: Minimum Hemostasis Achieved: Pressure End Time: 16:35 Procedural Pain: 2 Post Procedural Pain: 0 Response to Treatment: Procedure was tolerated well Level of Consciousness Awake and Alert (Post-procedure): Post Debridement Measurements of Total Wound Length: (cm) 1.6 Width: (cm) 1.5 Depth: (cm) 0.2 Volume: (cm) 0.377 Character of Wound/Ulcer Post Improved Debridement: Post Procedure Diagnosis Same as Pre-procedure Electronic Signature(s) Signed: 07/23/2019 5:02:14 PM By: Worthy Keeler PA-C Signed: 07/23/2019 5:49:26 PM By: Baruch Gouty RN, BSN Entered By: Baruch Gouty on 07/23/2019 16:38:57 -------------------------------------------------------------------------------- HPI Details Patient Name: Date of Service: [name], [name]  M. 07/23/2019 2:45 PM Medical Record [id number] Patient Account Number: [id number] Date of Birth/Sex: Treating RN: April 11, 1961 (58 y.o. [name] Primary Care Provider: [name] Other Clinician: Referring Provider: Treating Provider/Extender:Stone III, Luiz Blare, Archie Patten in Treatment: 0 History of Present Illness HPI Description: 07/23/2019 on evaluation today patient presents with a myriad of wounds noted at multiple locations over her right heel, right lower extremity, and abdominal region. She also has bilateral lower extremity lymphedema which is quite significant as well. Incidentally she also has congestive heart failure and hypertension. She also is obese. With that being said I think all this is contributing as well to her lower extremity edema which is very much uncontrolled. It seems like its been at least several months since she is worn any compression according to what she tells me. With that being said I am not sure exactly when that would have been. She does have fibrotic changes in the lower extremity secondary to lymphedema worse on the left than the right. She did show me pictures of wound she had on the anterior portion of her shin which was quite significant fortunately that has healed. These issues have been intermittent at this time. Again I think that with appropriate compression therapy she may  actually be doing better than what we are seeing at this point but again I am not really sure that she is ever been extremely compliant with that. Fortunately there is no signs of active infection at this time. No fever chills noted. As far as the abdominal ulcer she initially had one wound that she thinks may have been a bug bite. Subsequently she was put a dressing on this and she states that the tape pulled skin off on other locations causing other wounds that have not healed that is been about 3 months. The wounds on her lower extremities  have been intermittent over 3 years. This includes the heel. Electronic Signature(s) Signed: 07/23/2019 5:02:14 PM By: Stone III, Hoyt PA-C Entered By: Stone III, Hoyt on 07/23/2019 16:54:48 -------------------------------------------------------------------------------- Physical Exam Details Patient Name: Date of Service: [name], [name] 07/23/2019 2:45 PM Medical Record Number:[id number] Patient Account Number: [id number] Date of Birth/Sex: Treating RN: 07/04/1961 (58 y.o. F) Boehlein, Linda Primary Care Provider: Wendling, Nicholas Other Clinician: Referring Provider: Treating Provider/Extender:Stone III, Hoyt Wendling, Nicholas Weeks in Treatment: 0 Constitutional patient is hypertensive.. pulse regular and within target range for patient.. respirations regular, non-labored and within target range for patient.. temperature within target range for patient.. Obese and well-hydrated in no acute distress. Eyes conjunctiva clear no eyelid edema noted. pupils equal round and reactive to light and accommodation. Ears, Nose, Mouth, and Throat no gross abnormality of ear auricles or external auditory canals. normal hearing noted during conversation. mucus membranes moist. Respiratory normal breathing without difficulty. clear to auscultation bilaterally. Cardiovascular regular rate and rhythm with normal S1, S2. 2+ dorsalis pedis/posterior tibialis pulses. Patient has bilateral lower extremity lymphedema stage III.. Gastrointestinal (GI) soft, non-tender, non-distended, +BS. no ventral hernia noted. Musculoskeletal normal gait and posture. no significant deformity or arthritic changes, no loss or range of motion, no clubbing. Psychiatric this patient is able to make decisions and demonstrates good insight into disease process. Alert and Oriented x 3. pleasant and cooperative. Notes Upon inspection today patient's wounds did have necrotic tissue noted all wound locations at this  point. Fortunately I was able to perform sharp debridement to clear away the necrotic tissue in order to help with more appropriate healing. I do believe that she will benefit from utilization of a silver collagen dressing at all wound locations. He will especially seem to be doing quite well post debridement which was good news. Her ABIs were good measuring at 0.89 today on the right .Post debridement the wound bed appeared to be doing much better all wound locations. Electronic Signature(s) Signed: 07/23/2019 5:02:14 PM By: Stone III, Hoyt PA-C Entered By: Stone III, Hoyt on 07/23/2019 16:55:57 -------------------------------------------------------------------------------- Physician Orders Details Patient Name: Date of Service: [name], [name] 07/23/2019 2:45 PM Medical Record Number:[id number] Patient Account Number: [id number] Date of Birth/Sex: Treating RN: 05/29/1961 (58 y.o. F) Boehlein, Linda Primary Care Provider: Wendling, Nicholas Other Clinician: Referring Provider: Treating Provider/Extender:Stone III, Hoyt Wendling, Nicholas Weeks in Treatment: 0 Verbal / Phone Orders: No Diagnosis Coding Follow-up Appointments Return Appointment in 1 week. Skin Barriers/Peri-Wound Care Moisturizing lotion - to both legs Wound Cleansing Wound #1 Right Calcaneus May shower with protection. Wound #2 Right,Medial Lower Leg May shower with protection. Wound #3 Proximal Abdomen - midline May shower and wash wound with soap and water. Wound #4 Right,Medial Abdomen - Lower Quadrant May shower and wash wound with soap and water. Wound #5 Right,Lateral Abdomen - Lower Quadrant May shower and wash wound with soap and water. Wound #6   Distal Abdomen - midline May shower and wash wound with soap and water. Primary Wound Dressing Wound #1 Right Calcaneus Silver Collagen - moisten with saline or KY gel Wound #2 Right,Medial Lower Leg Silver Collagen - moisten with saline or KY gel Wound  #3 Proximal Abdomen - midline Silver Collagen - moisten with saline or KY gel Wound #4 Right,Medial Abdomen - Lower Quadrant Silver Collagen - moisten with saline or KY gel Wound #5 Right,Lateral Abdomen - Lower Quadrant Silver Collagen - moisten with saline or KY gel Wound #6 Distal Abdomen - midline Silver Collagen - moisten with saline or KY gel Secondary Dressing Wound #3 Proximal Abdomen - midline Dry Gauze - or ABD pad Wound #4 Right,Medial Abdomen - Lower Quadrant Dry Gauze - or ABD pad Wound #5 Right,Lateral Abdomen - Lower Quadrant Dry Gauze - or ABD pad Wound #6 Distal Abdomen - midline Dry Gauze - or ABD pad Wound #1 Right Calcaneus Dry Gauze Wound #2 Right,Medial Lower Leg Dry Gauze Edema Control 3 Layer Compression System - Bilateral Avoid standing for long periods of time Elevate legs to the level of the heart or above for 30 minutes daily and/or when sitting, a frequency of: - whenever sitting Exercise regularly Services and Therapies Venous Studies -Bilateral with reflux - bilateral lower extremity edema with venous stasis dermatitis Electronic Signature(s) Signed: 07/23/2019 5:02:14 PM By: Stone III, Hoyt PA-C Signed: 07/23/2019 5:49:26 PM By: Boehlein, Linda RN, BSN Entered By: Boehlein, Linda on 07/23/2019 16:44:28 -------------------------------------------------------------------------------- Prescription 07/23/2019 Patient Name: Spear, Jasmeet M. Provider: Stone III, Hoyt PA Date of Birth: 08/30/1961 NPI#: [id number] Sex: F DEA#: [id number] Phone #: [phone] License #: Patient Address: [facility] [address] [place], [place] [address] [address] Floor [place],  [address] [phone] Allergies fish oil Fish Containing Products Crestor lovastatin metronidazole Red Yeast Rice (Monascus Purpureus) rotigotine Atrovent Suprep Bowel Prep Kit adhesive tape Betadine Provider's  Orders Venous Studies -Bilateral with reflux - bilateral lower extremity edema with venous stasis dermatitis Signature(s): Date(s): Electronic Signature(s) Signed: 07/23/2019 5:02:14 PM By: Stone III, Hoyt PA-C Signed: 07/23/2019 5:49:26 PM By: Boehlein, Linda RN, BSN Entered By: Boehlein, Linda on 07/23/2019 16:44:29 --------------------------------------------------------------------------------  Problem List Details Patient Name: Date of Service: [name], [name] 07/23/2019 2:45 PM Medical Record Number:[id number] Patient Account Number: [id number] Date of Birth/Sex: Treating RN: 11/13/1960 (58 y.o. 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/23/2019 No Yes with fat layer exposed L97.812 Non-pressure chronic ulcer of other part of right lower 07/23/2019 No Yes leg with fat layer exposed L98.492 Non-pressure chronic ulcer of skin of other sites with 07/23/2019 No Yes fat layer exposed I50.42 Chronic combined systolic (congestive) and diastolic 07/23/2019 No Yes (congestive) heart failure I10 Essential (primary) hypertension 07/23/2019 No Yes Inactive Problems Resolved Problems Electronic Signature(s) Signed: 07/23/2019 5:02:14 PM By: Stone III, Hoyt PA-C Entered By: Stone III, Hoyt on 07/23/2019 16:52:03 -------------------------------------------------------------------------------- Progress Note Details Patient Name: Date of Service: [name], [name] 07/23/2019 2:45 PM Medical Record Number:[id number] Patient Account Number: [id number] Date of  Birth/Sex: Treating RN: 12/05/1960 (58 y.o. F) Boehlein, Linda Primary Care Provider: Wendling, Nicholas Other Clinician: Referring Provider:   Treating Provider/Extender:Stone III, Luiz Blare, Archie Patten in Treatment: 0 Subjective Chief Complaint Information obtained from Patient Abdominal ulcers and bilateral LE lymphedema with right LE ulcers History of Present Illness (HPI) 07/23/2019 on evaluation today patient presents with a myriad of wounds noted at multiple locations over her right heel, right lower extremity, and abdominal region. She also has bilateral lower extremity lymphedema which is quite significant as well. Incidentally she also has congestive heart failure and hypertension. She also is obese. With that being said I think all this is contributing as well to her lower extremity edema which is very much uncontrolled. It seems like its been at least several months since she is worn any compression according to what she tells me. With that being said I am not sure exactly when that would have been. She does have fibrotic changes in the lower extremity secondary to lymphedema worse on the left than the right. She did show me pictures of wound she had on the anterior portion of her shin which was quite significant fortunately that has healed. These issues have been intermittent at this time. Again I think that with appropriate compression therapy she may actually be doing better than what we are seeing at this point but again I am not really sure that she is ever been extremely compliant with that. Fortunately there is no signs of active infection at this time. No fever chills noted. As far as the abdominal ulcer she initially had one wound that she thinks may have been a bug bite. Subsequently she was put a dressing on this and she states that the tape pulled skin off on other locations causing other wounds that have not healed that is been about 3 months. The wounds on  her lower extremities have been intermittent over 3 years. This includes the heel. Patient History Information obtained from Patient. Allergies fish oil, Fish Containing Products, Crestor, lovastatin, metronidazole, Red Yeast Rice (Monascus Purpureus), rotigotine, Atrovent, Suprep Bowel Prep Kit, adhesive tape, Betadine Family History Diabetes - Mother, Heart Disease - Mother,Father,Siblings, Hypertension - Mother,Father,Siblings, Lung Disease - Father, Stroke - Mother, No family history of Cancer, Hereditary Spherocytosis, Kidney Disease, Seizures, Thyroid Problems, Tuberculosis. Social History Former smoker - quit 15 years ago, Marital Status - Married, Alcohol Use - Never, Drug Use - No History, Caffeine Use - Rarely. Medical History Eyes Patient has history of Cataracts - both eyes Hematologic/Lymphatic Patient has history of Lymphedema Respiratory Patient has history of Asthma Cardiovascular Patient has history of Congestive Heart Failure, Coronary Artery Disease, Hypertension Endocrine Patient has history of Type II Diabetes Musculoskeletal Patient has history of Rheumatoid Arthritis Neurologic Patient has history of Neuropathy Patient is treated with Oral Agents. Blood sugar is tested. Medical And Surgical History Notes Cardiovascular cardiac stents Gastrointestinal GERD Endocrine Hypothyroidism Musculoskeletal Fibromyalgia, Scoliosis Review of Systems (ROS) Constitutional Symptoms (General Health) Denies complaints or symptoms of Fatigue, Fever, Chills, Marked Weight Change. Ear/Nose/Mouth/Throat Denies complaints or symptoms of Chronic sinus problems or rhinitis. Genitourinary Denies complaints or symptoms of Frequent urination. Integumentary (Skin) Complains or has symptoms of Wounds - wound on right heel and lower leg. Objective Constitutional patient is hypertensive.. pulse regular and within target range for patient.Marland Kitchen respirations regular, non-labored  and within target range for patient.Marland Kitchen temperature within target range for patient.. Obese and well-hydrated in no acute distress. Vitals Time Taken: 3:40 PM, Height: 62 in, Source: Stated, Weight: 300 lbs, Source: Stated, BMI: 54.9, Temperature: 98.6 F, Pulse: 104 bpm, Respiratory Rate: 20 breaths/min, Blood  Pressure: 139/52 mmHg, Capillary Blood Glucose: 200 mg/dl. General Notes: glucose per pt report Eyes conjunctiva clear no eyelid edema noted. pupils equal round and reactive to light and accommodation. Ears, Nose, Mouth, and Throat no gross abnormality of ear auricles or external auditory canals. normal hearing noted during conversation. mucus membranes moist. Respiratory normal breathing without difficulty. clear to auscultation bilaterally. Cardiovascular regular rate and rhythm with normal S1, S2. 2+ dorsalis pedis/posterior tibialis pulses. Patient has bilateral lower extremity lymphedema stage III.. Gastrointestinal (GI) soft, non-tender, non-distended, +BS. no ventral hernia noted. Musculoskeletal normal gait and posture. no significant deformity or arthritic changes, no loss or range of motion, no clubbing. Psychiatric this patient is able to make decisions and demonstrates good insight into disease process. Alert and Oriented x 3. pleasant and cooperative. General Notes: Upon inspection today patient's wounds did have necrotic tissue noted all wound locations at this point. Fortunately I was able to perform sharp debridement to clear away the necrotic tissue in order to help with more appropriate healing. I do believe that she will benefit from utilization of a silver collagen dressing at all wound locations. He will especially seem to be doing quite well post debridement which was good news. Her ABIs were good measuring at 0.89 today on the right .Post debridement the wound bed appeared to be doing much better all wound locations. Integumentary (Hair, Skin) Wound #1  status is Open. Original cause of wound was Blister. The wound is located on the Right Calcaneus. The wound measures 1.3cm length x 2.2cm width x 0.2cm depth; 2.246cm^2 area and 0.449cm^3 volume. There is Fat Layer (Subcutaneous Tissue) Exposed exposed. There is no tunneling or undermining noted. There is a medium amount of serosanguineous drainage noted. The wound margin is flat and intact. There is medium (34-66%) pink granulation within the wound bed. There is a medium (34-66%) amount of necrotic tissue within the wound bed including Adherent Slough. Wound #2 status is Open. Original cause of wound was Blister. The wound is located on the Right,Medial Lower Leg. The wound measures 0.5cm length x 0.3cm width x 0.1cm depth; 0.118cm^2 area and 0.012cm^3 volume. There is Fat Layer (Subcutaneous Tissue) Exposed exposed. There is no tunneling or undermining noted. There is a small amount of serous drainage noted. The wound margin is flat and intact. There is large (67-100%) pale granulation within the wound bed. There is no necrotic tissue within the wound bed. Wound #3 status is Open. Original cause of wound was Gradually Appeared. The wound is located on the Proximal Abdomen - midline. The wound measures 0.8cm length x 0.8cm width x 0.1cm depth; 0.503cm^2 area and 0.05cm^3 volume. There is Fat Layer (Subcutaneous Tissue) Exposed exposed. There is no tunneling or undermining noted. There is a medium amount of serosanguineous drainage noted. The wound margin is flat and intact. There is no granulation within the wound bed. There is a large (67-100%) amount of necrotic tissue within the wound bed including Adherent Slough. Wound #4 status is Open. Original cause of wound was Gradually Appeared. The wound is located on the Right,Medial Abdomen - Lower Quadrant. The wound measures 1.6cm length x 1.5cm width x 0.1cm depth; 1.885cm^2 area and 0.188cm^3 volume. There is Fat Layer (Subcutaneous Tissue)  Exposed exposed. There is no tunneling or undermining noted. There is a medium amount of serosanguineous drainage noted. The wound margin is flat and intact. There is no granulation within the wound bed. There is a large (67-100%) amount of necrotic tissue within the wound   bed including Adherent Slough. Wound #5 status is Open. Original cause of wound was Gradually Appeared. The wound is located on the Right,Lateral Abdomen - Lower Quadrant. The wound measures 1.2cm length x 1cm width x 0.1cm depth; 0.942cm^2 area and 0.094cm^3 volume. There is Fat Layer (Subcutaneous Tissue) Exposed exposed. There is no tunneling or undermining noted. There is a medium amount of serosanguineous drainage noted. The wound margin is flat and intact. There is no granulation within the wound bed. There is a large (67-100%) amount of necrotic tissue within the wound bed including Adherent Slough. Wound #6 status is Open. Original cause of wound was Gradually Appeared. The wound is located on the Distal Abdomen - midline. The wound measures 1.3cm length x 0.5cm width x 0.1cm depth; 0.511cm^2 area and 0.051cm^3 volume. There is Fat Layer (Subcutaneous Tissue) Exposed exposed. There is no tunneling or undermining noted. There is a small amount of serosanguineous drainage noted. The wound margin is flat and intact. There is large (67-100%) pink granulation within the wound bed. There is a small (1-33%) amount of necrotic tissue within the wound bed including Adherent Slough. 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 There was a Excisional Skin/Subcutaneous Tissue Debridement with a total area of 6 sq cm performed by Stone III, Hoyt, PA. With the following instrument(s): Curette to remove Viable and Non-Viable tissue/material. Material removed includes Callus, Subcutaneous Tissue, Slough, Skin: Epidermis, and Biofilm after achieving pain control using Other (bezocaine, 20%). No specimens were taken. A time out was conducted at 16:30, prior to the start of the procedure. A Minimum amount of bleeding was controlled with Pressure. The procedure was tolerated well with a pain level of 2 throughout and a pain level of 0 following the procedure. Post Debridement Measurements: 1.3cm length x 2.2cm width x 0.1cm depth; 0.225cm^3 volume. Character of Wound/Ulcer Post Debridement is improved. Severity of Tissue Post Debridement is: Fat layer exposed. Post procedure Diagnosis Wound #1: Same as Pre-Procedure Wound #3 Pre-procedure diagnosis of Wound #3 is an Atypical located on the Proximal Abdomen - midline . There was a Excisional Skin/Subcutaneous Tissue Debridement with a total area of 0.64 sq cm performed by Stone III, Hoyt, PA. With the following instrument(s): Curette to remove Viable and Non-Viable tissue/material. Material removed includes Subcutaneous Tissue, Slough, and Biofilm after achieving pain control using Other (bezocaine, 20%). No specimens were taken. A time out was conducted at 16:30, prior to the start of the procedure. A Minimum amount of bleeding was controlled with Pressure. The procedure was tolerated well with a pain level of 2 throughout and a pain level of 0 following the procedure. Post Debridement Measurements: 0.8cm length x 0.8cm width x 0.1cm depth; 0.05cm^3 volume. Character of  Wound/Ulcer Post Debridement is improved. Post procedure Diagnosis Wound #3: Same as Pre-Procedure Wound #4 Pre-procedure diagnosis of Wound #4 is an Atypical located on the Right,Medial Abdomen - Lower Quadrant . There was a Excisional Skin/Subcutaneous Tissue Debridement with a total area of 2.4   sq cm performed by Stone III, Hoyt, PA. With the following instrument(s): Curette to remove Viable and Non-Viable tissue/material. Material removed includes Subcutaneous Tissue, Slough, and Biofilm after achieving pain control using Other (bezocaine, 20%). No specimens were taken. A time out was conducted at 16:30, prior to the start of the procedure. A Minimum amount of bleeding was controlled with Pressure. The procedure was tolerated well with a pain level of 2 throughout and a pain level of 0 following the procedure. Post Debridement Measurements: 1.6cm length x 1.5cm width x 0.2cm depth; 0.377cm^3 volume. Character of Wound/Ulcer Post Debridement is improved. Post procedure Diagnosis Wound #4: Same as Pre-Procedure Wound #5 Pre-procedure diagnosis of Wound #5 is an Atypical located on the Right,Lateral Abdomen - Lower Quadrant . There was a Excisional Skin/Subcutaneous Tissue Debridement with a total area of 1.2 sq cm performed by Stone III, Hoyt, PA. With the following instrument(s): Curette to remove Viable and Non-Viable tissue/material. Material removed includes Subcutaneous Tissue, Slough, and Biofilm after achieving pain control using Other (bezocaine, 20%). No specimens were taken. A time out was conducted at 16:30, prior to the start of the procedure. A Minimum amount of bleeding was controlled with Pressure. The procedure was tolerated well with a pain level of 2 throughout and a pain level of 0 following the procedure. Post Debridement Measurements: 1.2cm length x 1cm width x 0.1cm depth; 0.094cm^3 volume. Character of Wound/Ulcer Post Debridement is improved. Post procedure Diagnosis  Wound #5: Same as Pre-Procedure Wound #2 Pre-procedure diagnosis of Wound #2 is a Lymphedema located on the Right,Medial Lower Leg . There was a Three Layer Compression Therapy Procedure by Deaton, Bobbi, RN. Post procedure Diagnosis Wound #2: Same as Pre-Procedure There was a Three Layer Compression Therapy Procedure by Deaton, Bobbi, RN. Post procedure Diagnosis Wound #: Same as Pre-Procedure Plan Follow-up Appointments: Return Appointment in 1 week. Skin Barriers/Peri-Wound Care: Moisturizing lotion - to both legs Wound Cleansing: Wound #1 Right Calcaneus: May shower with protection. Wound #2 Right,Medial Lower Leg: May shower with protection. Wound #3 Proximal Abdomen - midline: May shower and wash wound with soap and water. Wound #4 Right,Medial Abdomen - Lower Quadrant: May shower and wash wound with soap and water. Wound #5 Right,Lateral Abdomen - Lower Quadrant: May shower and wash wound with soap and water. Wound #6 Distal Abdomen - midline: May shower and wash wound with soap and water. Primary Wound Dressing: Wound #1 Right Calcaneus: Silver Collagen - moisten with saline or KY gel Wound #2 Right,Medial Lower Leg: Silver Collagen - moisten with saline or [place] gel Wound #3 Proximal Abdomen - midline: Silver Collagen - moisten with saline or KY gel Wound #4 Right,Medial Abdomen - Lower Quadrant: Silver Collagen - moisten with saline or KY gel Wound #5 Right,Lateral Abdomen - Lower Quadrant: Silver Collagen - moisten with saline or [place] gel Wound #6 Distal Abdomen - midline: Silver Collagen - moisten with saline or KY gel Secondary Dressing: Wound #3 Proximal Abdomen - midline: Dry Gauze - or ABD pad Wound #4 Right,Medial Abdomen - Lower Quadrant: Dry Gauze - or ABD pad Wound #5 Right,Lateral Abdomen - Lower Quadrant: Dry Gauze - or ABD pad Wound #6 Distal Abdomen - midline: Dry Gauze - or ABD pad Wound #1 Right Calcaneus: Dry Gauze Wound #2 Right,Medial Lower  Leg: Dry Gauze Edema Control: 3 Layer Compression System - Bilateral Avoid standing for long periods of time Elevate legs to the level of the heart or above for 30 minutes daily and/or when sitting,   a frequency of: - whenever sitting Exercise regularly Services and Therapies ordered were: Venous Studies -Bilateral with reflux - bilateral lower extremity edema with venous stasis dermatitis 1. My suggestion at this time 2 oh all wound locations is good to be that we go ahead and initiate therapy with a silver collagen dressing. I think that will be appropriate across the board. 2. I am also going to recommend that we use a bilateral 3 layer compression wraps to help with the lower extremity edema at this point which I think will be beneficial for her as far as preventing recurrence of wounds. I think after we get some of the swelling down we may need to order her new compression stockings as that is something she needs to be wearing on a regular basis. 3. I am also can order venous studies bilateral with reflux. 4. We will cover the wounds on the abdominal region with an ABD pad and secured with tape carefully. 5. I do think the patient needs to elevate her legs whenever she is sitting also think being active is a good thing to do. Overall that will be of great benefit. 6. With regard to her heel I do believe she needs to keep pressure off of this by way of elevation as well when she is in the bed putting a pillow underneath her calf to raise up the heel. We will see patient back for reevaluation in 1 week here in the clinic. If anything worsens or changes patient will contact our office for additional recommendations. Electronic Signature(s) Signed: 07/23/2019 5:02:14 PM By: Stone III, Hoyt PA-C Entered By: Stone III, Hoyt on 07/23/2019 16:57:27 -------------------------------------------------------------------------------- HxROS Details Patient Name: Date of Service: [name], [name]  07/23/2019 2:45 PM Medical Record Number:[id number] Patient Account Number: [id number] Date of Birth/Sex: Treating RN: 12/05/1960 (58 y.o. F) Lynch, Shatara Primary Care Provider: Wendling, Nicholas Other Clinician: Referring Provider: Treating Provider/Extender:Stone III, Hoyt Wendling, Nicholas Weeks in Treatment: 0 Information Obtained From Patient Constitutional Symptoms (General Health) Complaints and Symptoms: Negative for: Fatigue; Fever; Chills; Marked Weight Change Ear/Nose/Mouth/Throat Complaints and Symptoms: Negative for: Chronic sinus problems or rhinitis Genitourinary Complaints and Symptoms: Negative for: Frequent urination Integumentary (Skin) Complaints and Symptoms: Positive for: Wounds - wound on right heel and lower leg Eyes Medical History: Positive for: Cataracts - both eyes Hematologic/Lymphatic Medical History: Positive for: Lymphedema Respiratory Medical History: Positive for: Asthma Cardiovascular Medical History: Positive for: Congestive Heart Failure; Coronary Artery Disease; Hypertension Past Medical History Notes: cardiac stents Gastrointestinal Medical History: Past Medical History Notes: GERD Endocrine Medical History: Positive for: Type II Diabetes Past Medical History Notes: Hypothyroidism Time with diabetes: since 2002 Treated with: Oral agents Blood sugar tested every day: Yes Tested : 2-3 times a day Immunological Musculoskeletal Medical History: Positive for: Rheumatoid Arthritis Past Medical History Notes: Fibromyalgia, Scoliosis Neurologic Medical History: Positive for: Neuropathy Oncologic HBO Extended History Items Eyes: Cataracts Immunizations Pneumococcal Vaccine: Received Pneumococcal Vaccination: Yes Implantable Devices None Family and Social History Cancer: No; Diabetes: Yes - Mother; Heart Disease: Yes - Mother,Father,Siblings; Hereditary Spherocytosis: No; Hypertension: Yes - Mother,Father,Siblings;  Kidney Disease: No; Lung Disease: Yes - Father; Seizures: No; Stroke: Yes - Mother; Thyroid Problems: No; Tuberculosis: No; Former smoker - quit 15 years ago; Marital Status - Married; Alcohol Use: Never; Drug Use: No History; Caffeine Use: Rarely; Financial Concerns: No; Food, Clothing or Shelter Needs: No; Support System Lacking: No; Transportation Concerns: No Electronic Signature(s) Signed: 07/23/2019 5:02:14 PM By: Stone III, Hoyt PA-C Signed: 07/28/2019   5:59:11 PM By: Lynch, Shatara RN, BSN Entered By: Lynch, Shatara on 07/23/2019 15:50:02 -------------------------------------------------------------------------------- SuperBill Details Patient Name: Date of Service: [name], [name] 07/23/2019 Medical Record Number:[id number] Patient Account Number: [id number] Date of Birth/Sex: Treating RN: 05/02/1961 (58 y.o. F) Boehlein, Linda Primary Care Provider: Wendling, Nicholas Other Clinician: Referring Provider: Treating Provider/Extender:Stone III, Hoyt Wendling, Nicholas Weeks in Treatment: 0 Diagnosis Coding ICD-10 Codes Code Description E11.621 Type 2 diabetes mellitus with foot ulcer I89.0 Lymphedema, not elsewhere classified L97.412 Non-pressure chronic ulcer of right heel and midfoot with fat layer exposed L97.812 Non-pressure chronic ulcer of other part of right lower leg with fat layer exposed L98.492 Non-pressure chronic ulcer of skin of other sites with fat layer exposed I50.42 Chronic combined systolic (congestive) and diastolic (congestive) heart failure I10 Essential (primary) hypertension Facility Procedures CPT4 Code Description: 76100138 99213 - WOUND CARE VISIT-LEV 3 EST PT Modifier: 25 Quantity: 1 CPT4 Code Description: 36100012 11042 - DEB SUBQ TISSUE 20 SQ CM/< ICD-10 Diagnosis Description L97.412 Non-pressure chronic ulcer of right heel and midfoot with fat l L98.492 Non-pressure chronic ulcer of skin of other sites with fat laye Modifier: ayer expose r  exposed Quantity: 1 d CPT4 Code Description: 36100161 (Facility Use Only) 29581LT - APPLY MULTLAY COMPRS LWR LT LEG Modifier: 59 Quantity: 1 Physician Procedures CPT4 Code Description: 6770473 99204 - WC PHYS LEVEL 4 - NEW PT ICD-10 Diagnosis Description E11.621 Type 2 diabetes mellitus with foot ulcer I89.0 Lymphedema, not elsewhere classified L97.412 Non-pressure chronic ulcer of right heel and midfoot with  L97.812 Non-pressure chronic ulcer of other part of right lower l Modifier: 25 fat layer exp eg with fat la Quantity: 1 osed yer exposed CPT4 Code Description: 6770168 11042 - WC PHYS SUBQ TISS 20 SQ CM ICD-10 Diagnosis Description L97.412 Non-pressure chronic ulcer of right heel and midfoot with fa L98.492 Non-pressure chronic ulcer of skin of other sites with fat l Modifier: t layer expose ayer exposed Quantity: 1 d Electronic Signature(s) Signed: 07/23/2019 5:49:26 PM By: Boehlein, Linda RN, BSN Signed: 07/30/2019 5:12:22 PM By: Stone III, Hoyt PA-C Previous Signature: 07/23/2019 5:02:14 PM Version By: Stone III, Hoyt PA-C Entered By: Boehlein, Linda on 07/23/2019 17:16:49 

## 2019-07-31 ENCOUNTER — Other Ambulatory Visit: Payer: Self-pay

## 2019-07-31 ENCOUNTER — Other Ambulatory Visit: Payer: Self-pay | Admitting: Cardiology

## 2019-07-31 ENCOUNTER — Other Ambulatory Visit (HOSPITAL_BASED_OUTPATIENT_CLINIC_OR_DEPARTMENT_OTHER): Payer: Self-pay | Admitting: Internal Medicine

## 2019-07-31 ENCOUNTER — Encounter (HOSPITAL_BASED_OUTPATIENT_CLINIC_OR_DEPARTMENT_OTHER): Payer: BC Managed Care – PPO | Admitting: Internal Medicine

## 2019-07-31 ENCOUNTER — Ambulatory Visit (HOSPITAL_BASED_OUTPATIENT_CLINIC_OR_DEPARTMENT_OTHER)
Admission: RE | Admit: 2019-07-31 | Discharge: 2019-07-31 | Disposition: A | Discharge status: Home / Self Care | Payer: BC Managed Care – PPO | Source: Ambulatory Visit | Attending: Internal Medicine | Admitting: Internal Medicine

## 2019-07-31 DIAGNOSIS — E11621 Type 2 diabetes mellitus with foot ulcer: Secondary | ICD-10-CM | POA: Diagnosis not present

## 2019-07-31 DIAGNOSIS — E11620 Type 2 diabetes mellitus with diabetic dermatitis: Secondary | ICD-10-CM

## 2019-07-31 NOTE — Progress Notes (Signed)
[name] ([id number]) Visit Report for 07/31/2019 HPI Details Patient Name: [name], [name] 07/31/2019 9:00 Date of Service: AM Medical Record [id number] Number: Patient Account Number: [id number] Treating RN: October 27, 1960 (58 y.o. Date of Birth/Sex: Female) Other Clinician: Primary Care Provider: [name] Treating Linton Ham Referring Provider: Provider/Extender: Tonny Bollman in Treatment: 1 History of Present Illness HPI Description: 07/23/2019 on evaluation today patient presents with a myriad of wounds noted at multiple locations over her right heel, right lower extremity, and abdominal region. She also has bilateral lower extremity lymphedema which is quite significant as well. Incidentally she also has congestive heart failure and hypertension. She also is obese. With that being said I think all this is contributing as well to her lower extremity edema which is very much uncontrolled. It seems like its been at least several months since she is worn any compression according to what she tells me. With that being said I am not sure exactly when that would have been. She does have fibrotic changes in the lower extremity secondary to lymphedema worse on the left than the right. She did show me pictures of wound she had on the anterior portion of her shin which was quite significant fortunately that has healed. These issues have been intermittent at this time. Again I think that with appropriate compression therapy she may actually be doing better than what we are seeing at this point but again I am not really sure that she is ever been extremely compliant with that. Fortunately there is no signs of active infection at this time. No fever chills noted. As far as the abdominal ulcer she initially had one wound that she thinks may have been a bug bite. Subsequently she was put a dressing on this and she states that the tape pulled skin off on other  locations causing other wounds that have not healed that is been about 3 months. The wounds on her lower extremities have been intermittent over 3 years. This includes the heel. 11/19; this is a patient that I have not seen previously. She was admitted to our clinic last week with multiple wounds including several superficial circular areas on her abdomen predominantly right upper quadrant. Also 1 in her umbilicus. She has an area on her right lateral malleolus which was new today. She has bilateral lower extremity edema with very significant stasis dermatitis on the left anterior tibial area. She has tightly adherent skin in her lower extremities probably secondary to cutaneous fibrosis in the area. We put her in compression last week. She comes in with the dressings reasonably saturated. 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 She has multiple areas on her arms from scratching. Electronic Signature(s) Signed: 07/31/2019 6:25:25 PM By: Linton Ham MD Entered By: Linton Ham on 07/31/2019 13:44:04 -------------------------------------------------------------------------------- Physical Exam Details Patient Name: [name], [name] 07/31/2019 9:00 Date of Service: AM Medical Record [id number] Number: Patient Account Number: [id number] Treating RN: [birth year] (58 y.o. Date of Birth/Sex: Female) Other Clinician: Primary Care Provider: Marylin Crosby Referring Provider: Provider/Extender: Tonny Bollman in Treatment: 1 Constitutional Patient is hypertensive.. Pulse regular and within target range for patient.Marland Kitchen Respirations regular, non-labored and within target range.. Temperature is normal and within the target range for the patient.Marland Kitchen Appears in  no distress. Somewhat cushingoid in appearance. Eyes Conjunctivae clear. No  discharge.no icterus. Respiratory work of breathing is normal. Cardiovascular Heart rhythm and rate regular, without murmur or gallop.. Gastrointestinal (GI) Distended with abdominal wall striations. No tenderness. No liver or spleen enlargement. Integumentary (Hair, Skin) Large area of stasis dermatitis in the left anterior tibial area bilateral lymphedema. Psychiatric appears at normal baseline. Notes Wound exam; Majority of her lower extremity wounds is on the right heel and then a new one on the right lateral malleolus. No debridement was done. Weeping lymphedema from the left anterior pretibia I think was better with compression. Multiple skin areas in the right upper quadrant her umbilicus or circular areas. She also has excoriations on her arms Electronic Signature(s) Signed: 07/31/2019 6:25:25 PM By: Linton Ham MD Entered By: Linton Ham on 07/31/2019 13:47:11 -------------------------------------------------------------------------------- Physician Orders Details Patient Name: [name], [name] 07/31/2019 9:00 Date of Service: AM Medical Record [id number] Number: Patient Account Number: [id number] Treating RN: 1961/01/05 (58 y.o. 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 Margarita Grizzle Nurse Visit: - next week. Skin Barriers/Peri-Wound Care TCA Cream or Ointment - mixed with lotion liberally both legs. Wound Cleansing Wound #1 Right Calcaneus May shower with protection. Wound #3 Proximal Abdomen - midline May shower and wash wound with soap and water. Wound #4 Right,Medial Abdomen - Lower Quadrant May shower and wash wound with soap and water. Wound #5 Right,Lateral Abdomen - Lower Quadrant May shower and wash wound with soap and water. Wound #6 Distal Abdomen - midline May shower and wash wound with soap and water. Primary Wound Dressing Wound #1 Right Calcaneus Calcium Alginate with Silver Wound #3 Proximal Abdomen - midline Calcium Alginate with Silver Wound #4 Right,Medial Abdomen - Lower Quadrant Calcium Alginate with Silver Wound #5 Right,Lateral Abdomen - Lower Quadrant Calcium Alginate with Silver Wound #6 Distal Abdomen - midline Calcium Alginate with Silver Wound #7 Right,Medial Malleolus Calcium Alginate with Silver Secondary Dressing Other: - left leg apply ABD pad to weeping areas. Wound #1 Right Calcaneus Dry Gauze Wound #3 Proximal Abdomen - midline Dry Gauze - or ABD pad Wound #4 Right,Medial Abdomen - Lower Quadrant Dry Gauze - or ABD pad Wound #5 Right,Lateral Abdomen - Lower Quadrant Dry Gauze - or ABD pad Wound #6 Distal Abdomen - midline Dry Gauze - or ABD pad Edema Control 4 layer compression - Bilateral - unna boot upper portion of lower leg. Avoid standing for long periods of time Elevate legs to the level of the heart or above for 30 minutes daily and/or when sitting, a frequency of: - whenever sitting Exercise regularly Off-Loading Open toe surgical shoe to: - to both feet. Radiology X-ray, foot - x-ray of right foot related to diabetic foot ulcer to rule out infection.  CPT code - (ICD10 E11.621 - Type 2 diabetes mellitus with foot ulcer) Electronic Signature(s) Signed: 07/31/2019 6:25:25 PM By: Linton Ham MD Signed: 07/31/2019 6:38:14 PM By: Deon Pilling Entered By:  Deon Pilling on 07/31/2019 11:08:12 -------------------------------------------------------------------------------- Prescription 07/31/2019 Patient Name: Barbuto, Donielle M. Provider: Linton Ham MD Date of Birth: 1961/04/04 NPI#: [id number] Sex: Female DEA#: [id number] Phone #: [phone] License #: [id number] Patient Address: [address], [place] [address] [address] [place], [place] [address] [phone] Allergies fish oil Fish Containing Products Crestor lovastatin metronidazole Red Yeast Rice ([place] Purpureus) rotigotine Atrovent Suprep Bowel Prep Kit adhesive tape Betadine Provider's Orders X-ray, foot - ICD10: E11.621 - x-ray of right foot related to diabetic foot ulcer to rule out infection. CPT code Signature(s): Date(s): Electronic Signature(s) Signed: 07/31/2019 6:25:25 PM By: Linton Ham MD Signed: 07/31/2019 6:38:14 PM By: Deon Pilling Entered By: Deon Pilling on 07/31/2019 11:08:13 --------------------------------------------------------------------------------  Problem List Details Patient Name: Quinney, Netasha M. 07/31/2019 9:00 Date of Service: AM Medical Record [id number] Number: Patient Account Number: [id number] Treating RN: 07-Oct-1960 (58 y.o. 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 mellitus with foot ulcer 07/23/2019 No Yes I89.0 Lymphedema,  not elsewhere classified 07/23/2019 No Yes L97.412 Non-pressure chronic ulcer of right heel and midfoot 07/23/2019 No Yes with fat layer exposed L97.812 Non-pressure chronic ulcer of other part of right lower 07/23/2019 No Yes leg with fat layer exposed L98.492 Non-pressure chronic ulcer of skin of other sites with 07/23/2019 No Yes fat layer exposed I50.42 Chronic combined systolic (congestive) and diastolic 15/01/6978 No Yes (congestive) heart failure I10 Essential (primary) hypertension 07/23/2019 No Yes Inactive Problems Resolved Problems Electronic Signature(s) Signed: 07/31/2019 6:25:25 PM By: Linton Ham MD Entered By: Linton Ham on 07/31/2019 13:40:08 -------------------------------------------------------------------------------- Progress Note Details Patient Name: Cooter, Kynisha M. 07/31/2019 9:00 Date of Service: AM Medical Record [id number] Number: Patient Account Number: [id number] Treating RN: [birth year] (58 y.o. Date of Birth/Sex: Female) Other Clinician: Primary Care Provider: [name] Treating Linton Ham Referring Provider: Provider/Extender: Tonny Bollman in Treatment: 1 Subjective History of Present Illness (HPI) 07/23/2019 on evaluation today patient presents with a myriad of wounds noted at multiple locations over her right heel, right lower extremity, and abdominal region. She also has bilateral lower extremity lymphedema which is quite significant as well. Incidentally she also has congestive heart failure and hypertension. She also is obese. With that being said I think all this is contributing as well to her lower extremity edema which is very much uncontrolled. It seems like its been at least several months since she is worn any compression according to what she tells me. With that being said I am not sure exactly when that would have been. She does have fibrotic changes in the lower extremity secondary to lymphedema  worse on the left than the right. She did show me pictures of wound she had on the anterior portion of her shin which was quite significant fortunately that has healed. These issues have been intermittent at this time. Again I think that with appropriate compression therapy she may actually be doing better than what we are seeing at this point but again I am not really sure that she is ever been extremely compliant with that. Fortunately there is no signs of active infection at this time. No fever chills noted. As far as the abdominal ulcer she initially had one wound that she thinks may have  been a bug bite. Subsequently she was put a dressing on this and she states that the tape pulled skin off on other locations causing other wounds that have not healed that is been about 3 months. The wounds on her lower extremities have been intermittent over 3 years. This includes the heel. 11/19; this is a patient that I have not seen previously. She was admitted to our clinic last week with multiple wounds including several superficial circular areas on her abdomen predominantly right upper quadrant. Also 1 in her umbilicus. She has an area on her right lateral malleolus which was new today. She has bilateral lower extremity edema with very significant stasis dermatitis on the left anterior tibial area. She has tightly adherent skin in her lower extremities probably secondary to cutaneous fibrosis in the area. We put her in compression last week. She comes in with the dressings reasonably saturated. 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 She has multiple areas on her arms from scratching. Objective Constitutional Patient is hypertensive.. Pulse regular and within target range for patient.Marland Kitchen Respirations  regular, non-labored and within target range.. Temperature is normal and within the target range for the patient.Marland Kitchen Appears in no distress. Somewhat cushingoid in appearance. Vitals Time Taken: 10:11 AM, Height: 62 in, Weight: 300 lbs, BMI: 54.9, Temperature: 98.3 F, Pulse: 108 bpm, Respiratory Rate: 19 breaths/min, Blood Pressure: 169/72 mmHg, Capillary Blood Glucose: 157 mg/dl. General Notes: patient reported CBG of 157 this morning Eyes Conjunctivae clear. No discharge.no icterus. Respiratory work of breathing is normal. Cardiovascular Heart rhythm and rate regular, without murmur or gallop.. Gastrointestinal (GI) Distended with abdominal wall striations. No tenderness. No liver or spleen enlargement. Psychiatric appears at normal baseline. General Notes: Wound exam; ooMajority of her lower extremity wounds is on the right heel and then a new one on the right lateral malleolus. No debridement was done. ooWeeping lymphedema from the left anterior pretibia I think was better with compression. ooMultiple skin areas in the right upper quadrant her umbilicus or circular areas. She also has excoriations on her arms Integumentary (Hair, Skin) Large area of stasis dermatitis in the left anterior tibial area bilateral lymphedema. Wound #1 status is Open. Original cause of wound was Blister. The wound is located on the Right Calcaneus. The wound measures 0.6cm length x 1cm width x 0.1cm depth; 0.471cm^2 area and 0.047cm^3 volume. There is Fat Layer (Subcutaneous Tissue) Exposed exposed. There is no tunneling or undermining noted. There is a small amount of serous drainage noted. The wound margin is distinct with the outline attached to the wound base. There is large (67-100%) pink, pale granulation within the wound bed. There is a small (1-33%) amount of necrotic tissue within the wound bed including Adherent Slough. Wound #2 status is Open. Original cause of wound was Blister. The wound is  located on the Right,Medial Lower Leg. The wound measures 0cm length x 0cm width x 0cm depth; 0cm^2 area and 0cm^3 volume. There is no tunneling or undermining noted. There is a none present amount of drainage noted. The wound margin is flat and intact. There is no granulation within the wound bed. There is no necrotic tissue within the wound bed. Wound #3 status is Open. Original cause of wound was Gradually Appeared. The wound is located on the Proximal Abdomen - midline. The wound measures 0.7cm length  x 0.9cm width x 0.1cm depth; 0.495cm^2 area and 0.049cm^3 volume. There is Fat Layer (Subcutaneous Tissue) Exposed exposed. There is no tunneling or undermining noted. There is a medium amount of serosanguineous drainage noted. The wound margin is flat and intact. There is small (1-33%) pink granulation within the wound bed. There is a large (67-100%) amount of necrotic tissue within the wound bed including Adherent Slough. Wound #4 status is Open. Original cause of wound was Gradually Appeared. The wound is located on the Right,Medial Abdomen - Lower Quadrant. The wound measures 1.6cm length x 1.4cm width x 0.1cm depth; 1.759cm^2 area and 0.176cm^3 volume. There is Fat Layer (Subcutaneous Tissue) Exposed exposed. There is no tunneling or undermining noted. There is a medium amount of serosanguineous drainage noted. The wound margin is flat and intact. There is medium (34-66%) pink granulation within the wound bed. There is a medium (34-66%) amount of necrotic tissue within the wound bed including Adherent Slough. Wound #5 status is Open. Original cause of wound was Gradually Appeared. The wound is located on the Right,Lateral Abdomen - Lower Quadrant. The wound measures 1cm length x 0.9cm width x 0.1cm depth; 0.707cm^2 area and 0.071cm^3 volume. There is Fat Layer (Subcutaneous Tissue) Exposed exposed. There is no tunneling or undermining noted. There is a medium amount of serosanguineous  drainage noted. The wound margin is flat and intact. There is medium (34-66%) pink granulation within the wound bed. There is a medium (34-66%) amount of necrotic tissue within the wound bed including Adherent Slough. Wound #6 status is Open. Original cause of wound was Gradually Appeared. The wound is located on the Distal Abdomen - midline. The wound measures 1.2cm length x 0.3cm width x 0.1cm depth; 0.283cm^2 area and 0.028cm^3 volume. There is Fat Layer (Subcutaneous Tissue) Exposed exposed. There is no tunneling or undermining noted. There is a small amount of serosanguineous drainage noted. The wound margin is flat and intact. There is large (67-100%) pink granulation within the wound bed. There is a small (1-33%) amount of necrotic tissue within the wound bed including Adherent Slough. Wound #7 status is Open. Original cause of wound was Gradually Appeared. The wound is located on the Right,Medial Malleolus. The wound measures 1.7cm length x 1cm width x 0.1cm depth; 1.335cm^2 area and 0.134cm^3 volume. There is Fat Layer (Subcutaneous Tissue) Exposed exposed. There is no tunneling or undermining noted. There is a small amount of serous drainage noted. The wound margin is distinct with the outline attached to the wound base. There is large (67-100%) pale granulation within the wound bed. There is no necrotic tissue within the wound bed. Assessment Active Problems ICD-10 Type 2 diabetes mellitus with foot ulcer Lymphedema, not elsewhere classified Non-pressure chronic ulcer of right heel and midfoot with fat layer exposed Non-pressure chronic ulcer of other part of right lower leg with fat layer exposed Non-pressure chronic ulcer of skin of other sites with fat layer exposed Chronic combined systolic (congestive) and diastolic (congestive) heart failure Essential (primary) hypertension Procedures Wound #1 Pre-procedure diagnosis of Wound #1 is a Diabetic Wound/Ulcer of the Lower  Extremity located on the Right Calcaneus . There was a Three Layer Compression Therapy Procedure with a pre-treatment ABI of 0.9 by Baruch Gouty, RN. Post procedure Diagnosis Wound #1: Same as Pre-Procedure Wound #7 Pre-procedure diagnosis of Wound #7 is a Lymphedema located on the Right,Medial Malleolus . There was a Three Layer Compression Therapy Procedure with a pre-treatment ABI of 0.9 by Baruch Gouty, RN. Post procedure Diagnosis Wound #  7: Same as Pre-Procedure There was a Three Layer Compression Therapy Procedure by Baruch Gouty, RN. Post procedure Diagnosis Wound #: Same as Pre-Procedure Plan Follow-up Appointments: Return Appointment in 2 weeks. Margarita Grizzle Nurse Visit: - next week. Skin Barriers/Peri-Wound Care: TCA Cream or Ointment - mixed with lotion liberally both legs. Wound Cleansing: Wound #1 Right Calcaneus: May shower with protection. Wound #3 Proximal Abdomen - midline: May shower and wash wound with soap and water. Wound #4 Right,Medial Abdomen - Lower Quadrant: May shower and wash wound with soap and water. Wound #5 Right,Lateral Abdomen - Lower Quadrant: May shower and wash wound with soap and water. Wound #6 Distal Abdomen - midline: May shower and wash wound with soap and water. Primary Wound Dressing: Wound #1 Right Calcaneus: Calcium Alginate with Silver Wound #3 Proximal Abdomen - midline: Calcium Alginate with Silver Wound #4 Right,Medial Abdomen - Lower Quadrant: Calcium Alginate with Silver Wound #5 Right,Lateral Abdomen - Lower Quadrant: Calcium Alginate with Silver Wound #6 Distal Abdomen - midline: Calcium Alginate with Silver Wound #7 Right,Medial Malleolus: Calcium Alginate with Silver Secondary Dressing: Other: - left leg apply ABD pad to weeping areas. Wound #1 Right Calcaneus: Dry Gauze Wound #3 Proximal Abdomen - midline: Dry Gauze - or ABD pad Wound #4 Right,Medial Abdomen - Lower Quadrant: Dry Gauze - or ABD pad Wound #5  Right,Lateral Abdomen - Lower Quadrant: Dry Gauze - or ABD pad Wound #6 Distal Abdomen - midline: Dry Gauze - or ABD pad Edema Control: 4 layer compression - Bilateral - unna boot upper portion of lower leg. Avoid standing for long periods of time Elevate legs to the level of the heart or above for 30 minutes daily and/or when sitting, a frequency of: - whenever sitting Exercise regularly Off-Loading: Open toe surgical shoe to: - to both feet. Radiology ordered were: X-ray, foot - x-ray of right foot related to diabetic foot ulcer to rule out infection. CPT code 1. The patient definitely has lymphedema in both lower legs. 2. The abdominal wound center more difficult to explain. This may be pruritus/scratching issue. Whether any of this could be related to her rheumatologic conditions I am uncertain. 3. The cause of her continuous generalized pruritus is not clear. 4. We are using silver alginate to the areas on the heel as well as the abdomen 5. I have increased her to 4 layer compression to see if we can get the majority of the fluid out of her legs. She is definitely going to need ongoing compression of some prescription Electronic Signature(s) Signed: 07/31/2019 6:25:25 PM By: Linton Ham MD Entered By: Linton Ham on 07/31/2019 13:49:54 -------------------------------------------------------------------------------- SuperBill Details Patient Name: Date of Service: Mey, Corrinna M. 07/31/2019 Medical Record [id number] Patient Account Number: [id number] Date of Birth/Sex: Treating RN: Apr 25, 1961 (58 y.o. Female) Deon Pilling Primary Care Provider: [name] Other Clinician: Referring Provider: Treating Provider/Extender:Raevon Broom, Arlis Porta, Archie Patten in Treatment: 1 Diagnosis Coding ICD-10 Codes Code Description E11.621 Type 2 diabetes mellitus with foot ulcer I89.0 Lymphedema, not elsewhere classified L97.412 Non-pressure chronic ulcer of  right heel and midfoot with fat layer exposed L97.812 Non-pressure chronic ulcer of other part of right lower leg with fat layer exposed L98.492 Non-pressure chronic ulcer of skin of other sites with fat layer exposed I50.42 Chronic combined systolic (congestive) and diastolic (congestive) heart failure I10 Essential (primary) hypertension Facility Procedures CPT4: Description Modifier Quantity Code [id number] BILATERAL: Application of multi-layer venous compression system; leg 1 (below knee), including ankle and foot. Physician Procedures CPT4  Code Description: [id number] - WC PHYS LEVEL 3 - EST PT ICD-10 Diagnosis Description O37.290 Non-pressure chronic ulcer of right heel and midfoot wit L97.812 Non-pressure chronic ulcer of other part of right lower E11.621 Type 2 diabetes mellitus  with foot ulcer Modifier: h fat layer exp leg with fat la Quantity: 1 osed yer exposed Electronic Signature(s) Signed: 07/31/2019 6:25:25 PM By: Linton Ham MD Entered By: Linton Ham on 07/31/2019 13:50:19

## 2019-07-31 NOTE — Progress Notes (Addendum)
[name] ([id number]) Visit Report for 07/31/2019 Arrival Information Details Patient Name: Date of Service: [name] 07/31/2019 9:00 AM Medical Record [id number] Patient Account Number: [id number] Date of Birth/Sex: March 31, 1961 (58 y.o. Female) Treating RN: Cherylin Mylar Primary Care Amarilis Belflower: [name] Other Clinician: Referring Talena Neira: Treating Joel Mericle/Extender:Robson, Hermelinda Dellen, Salvadore Dom in Treatment: 1 Visit Information History Since Last Visit Added or deleted any medications: No Patient Arrived: Dan Humphreys Any new allergies or adverse reactions: No Arrival Time: 10:06 Had a fall or experienced change in No Accompanied By: family member activities of daily living that may affect Transfer Assistance: None risk of falls: Patient Requires Transmission- No Signs or symptoms of abuse/neglect since No Based Precautions: last visito Patient Has Alerts: Yes Hospitalized since last visit: No Patient Alerts: Patient on Blood Implantable device outside of the clinic No Thinner excluding cellular tissue based products placed in the center since last visit: Has Dressing in Place as Prescribed: Yes Has Compression in Place as Prescribed: Yes Has Footwear/Offloading in Place as Yes Prescribed: Left: Other:Darco Right: Other:Darco Pain Present Now: No Electronic Signature(s) Signed: 07/31/2019 6:05:31 PM By: Cherylin Mylar Entered By: Cherylin Mylar on 07/31/2019 10:11:44 -------------------------------------------------------------------------------- Compression Therapy Details Patient Name: Date of Service: [name], [name] 07/31/2019 9:00 AM Medical Record [id number] Patient Account Number: [id number] Date of Birth/Sex: 04-Aug-1961 (58 y.o. Female) Treating RN: Shawn Stall Primary Care Loanne Emery: [name] Other Clinician: Referring Brendan Gadson: Treating Ewart Carrera/Extender:Robson, Hermelinda Dellen,  Salvadore Dom in Treatment: 1 Compression Therapy Performed for Wound Wound #7 Right,Medial Malleolus Assessment: Performed By: Clinician Zenaida Deed, RN Compression Type: Three Layer Pre Treatment ABI: 0.9 Post Procedure Diagnosis Same as Pre-procedure Electronic Signature(s) Signed: 07/31/2019 6:38:14 PM By: Shawn Stall Entered By: Shawn Stall on 07/31/2019 11:01:18 -------------------------------------------------------------------------------- Compression Therapy Details Patient Name: Date of Service: Kozak, Johari M. 07/31/2019 9:00 AM Medical Record [id number] Patient Account Number: [id number] Date of Birth/Sex: [birth year] (58 y.o. Female) Treating RN: Shawn Stall Primary Care Joedy Eickhoff: [name] Other Clinician: Referring Omarri Eich: Treating Alpha Chouinard/Extender:Robson, Hermelinda Dellen, Salvadore Dom in Treatment: 1 Compression Therapy Performed for Wound Wound #1 Right Calcaneus Assessment: Performed By: Clinician Zenaida Deed, RN Compression Type: Three Layer Pre Treatment ABI: 0.9 Post Procedure Diagnosis Same as Pre-procedure Electronic Signature(s) Signed: 07/31/2019 6:38:14 PM By: Shawn Stall Entered By: Shawn Stall on 07/31/2019 11:01:18 -------------------------------------------------------------------------------- Compression Therapy Details Patient Name: Date of Service: Werber, Debraann M. 07/31/2019 9:00 AM Medical Record [id number] Patient Account Number: [id number] Date of Birth/Sex: 07-Dec-1960 (58 y.o. Female) Treating RN: Shawn Stall Primary Care Paulanthony Gleaves: [name] Other Clinician: Referring Stephano Arrants: Treating Marlies Ligman/Extender:Robson, Hermelinda Dellen, Salvadore Dom in Treatment: 1 Compression Therapy Performed for Wound NonWound Condition Lymphedema - Left Leg Assessment: Performed By: Clinician Zenaida Deed, RN Compression Type: Three Layer Post Procedure Diagnosis Same as  Pre-procedure Electronic Signature(s) Signed: 07/31/2019 6:38:14 PM By: Shawn Stall Entered By: Shawn Stall on 07/31/2019 11:01:30 -------------------------------------------------------------------------------- Encounter Discharge Information Details Patient Name: Date of Service: Hegwood, Keyly M. 07/31/2019 9:00 AM Medical Record [id number] Patient Account Number: [id number] Date of Birth/Sex: Mar 03, 1961 (58 y.o. Female) Treating RN: Zenaida Deed Primary Care Rani Idler: [name] Other Clinician: Referring Crystall Donaldson: Treating Olvin Rohr/Extender:Robson, Hermelinda Dellen, Salvadore Dom in Treatment: 1 Encounter Discharge Information Items Discharge Condition: Stable Ambulatory Status: Walker Discharge Destination: Home Transportation: Private Auto Accompanied By: spouse Schedule Follow-up Appointment: Yes Clinical Summary of Care: Patient Declined Electronic Signature(s) Signed: 07/31/2019 6:27:04 PM By: Zenaida Deed RN, BSN Entered By: Zenaida Deed on 07/31/2019 11:47:22 -------------------------------------------------------------------------------- Lower Extremity Assessment Details Patient Name: Date  of Service: Pittman, Ileanna M. 07/31/2019 9:00 AM Medical Record [id number] Patient Account Number: [id number] Date of Birth/Sex: 1961-05-27 (58 y.o. Female) Treating RN: Cherylin Mylarwiggins, Shannon Primary Care Ketty Bitton: Arva ChafeWendling, Nicholas Other Clinician: Referring Myrl Bynum: Treating Taniqua Issa/Extender:Robson, Hermelinda DellenMichael Wendling, Salvadore DomNicholas Weeks in Treatment: 1 Edema Assessment Assessed: [Left: No] [Right: No] Edema: [Left: Yes] [Right: Yes] Calf Left: Right: Point of Measurement: 35 cm From Medial Instep 45 cm 42.2 cm Ankle Left: Right: Point of Measurement: 10 cm From Medial Instep 23.7 cm 24 cm Vascular Assessment Pulses: Dorsalis Pedis Palpable: [Left:Yes] [Right:Yes] Electronic Signature(s) Signed: 07/31/2019 6:05:31 PM By: Cherylin Mylarwiggins,  Shannon Entered By: Cherylin Mylarwiggins, Shannon on 07/31/2019 10:17:02 -------------------------------------------------------------------------------- Multi Wound Chart Details Patient Name: Date of Service: Ulloa, Olayinka M. 07/31/2019 9:00 AM Medical Record [id number] Patient Account Number: [id number] Date of Birth/Sex: 1961-05-27 (58 y.o. Female) Treating RN: Primary Care Lauraine Crespo: Arva ChafeWendling, Nicholas Other Clinician: Referring Arnet Hofferber: Treating Canyon Lohr/Extender:Robson, Hermelinda DellenMichael Wendling, Salvadore DomNicholas Weeks in Treatment: 1 Vital Signs Height(in): 62 Capillary Blood 157 Glucose(mg/dl): Weight(lbs): 578300 Pulse(bpm): 108 Body Mass Index(BMI): 55 Blood Pressure(mmHg): 169/72 Temperature(F): 98.3 Respiratory 19 Rate(breaths/min): Photos: [1:No Photos] [2:No Photos] [3:No Photos] Wound Location: [1:Right Calcaneus] [2:Right Lower Leg - Medial] [3:Abdomen - midline - Proximal] Wounding Event: [1:Blister] [2:Blister] [3:Gradually Appeared] 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] [3:Atypical] Comorbid History: [1:Cataracts, Lymphedema, Cataracts, Lymphedema, Asthma, Congestive Heart Asthma, Congestive Heart Failure, Coronary Artery Disease, Hypertension, Type II Diabetes, Rheumatoid Arthritis, Neuropathy] [2:Failure, Coronary Artery Disease,  Hypertension, Type II Diabetes, Rheumatoid Arthritis, Neuropathy] [3:Cataracts, Lymphedema, Asthma, Congestive Heart Failure, Coronary Artery Disease, Hypertension, Type II Diabetes, Rheumatoid Arthritis, Neuropathy] Date Acquired: [1:09/11/2016] [2:07/21/2019] [3:05/13/2019] Weeks of Treatment: [1:1] [2:1] [3:1] Wound Status: [1:Open] [2:Open] [3:Open] Measurements L x W x D 0.6x1x0.1 [2:0x0x0] [3:0.7x0.9x0.1] (cm) Area (cm) : [1:0.471] [2:0] [3:0.495] Volume (cm) : [1:0.047] [2:0] [3:0.049] % Reduction in Area: [1:79.00%] [2:100.00%] [3:1.60%] % Reduction in Volume: 89.50% [2:100.00%] [3:2.00%] Classification:  [1:Grade 2] [2:Full Thickness Without Exposed Support Structures Exposed Support Structures] [3:Full Thickness Without] Exudate Amount: [1:Small] [2:None Present] [3:Medium] Exudate Type: [1:Serous] [2:N/A] [3:Serosanguineous] Exudate Color: [1:amber] [2:N/A] [3:red, brown] Wound Margin: [1:Distinct, outline attached] [2:Flat and Intact] [3:Flat and Intact] Granulation Amount: [1:Large (67-100%)] [2:None Present (0%)] [3:Small (1-33%)] Granulation Quality: [1:Pink, Pale] [2:N/A] [3:Pink] Necrotic Amount: [1:Small (1-33%)] [2:None Present (0%)] [3:Large (67-100%)] Exposed Structures: [1:Fat Layer (Subcutaneous Tissue) Exposed: Yes Fascia: No Tendon: No Muscle: No Joint: No Bone: No] [2:Fascia: No Fat Layer (Subcutaneous Tissue) Exposed: Yes Tissue) Exposed: No Tendon: No Muscle: No Joint: No Bone: No] [3:Fat Layer (Subcutaneous  Fascia: No Tendon: No Muscle: No Joint: No Bone: No] Epithelialization: [1:Small (1-33%)] [2:Large (67-100%)] [3:None] Procedures Performed: [1:Compression Therapy 4] [2:N/A 5] [3:N/A 6] Photos: [1:No Photos] [2:No Photos] [3:No Photos] Wound Location: [1:Right Abdomen - Lower Quadrant - Medial] [2:Right Abdomen - Lower Quadrant - Lateral] [3:Abdomen - midline - Distal] Wounding Event: [1:Gradually Appeared] [2:Gradually Appeared] [3:Gradually Appeared] Primary Etiology: [1:Atypical] [2:Atypical] [3:Atypical] Comorbid History: [1:Cataracts, Lymphedema, Cataracts, Lymphedema, Cataracts, Lymphedema, Asthma, Congestive Heart Asthma, Congestive Heart Asthma, Congestive Heart Failure, Coronary Artery Disease, Hypertension, Type II Diabetes, Rheumatoid Arthritis,  Neuropathy] [2:Failure, Coronary Artery Disease, Hypertension, Type II Diabetes, Rheumatoid Arthritis, Neuropathy] [3:Failure, Coronary Artery Disease, Hypertension, Type II Diabetes, Rheumatoid Arthritis, Neuropathy] Date Acquired: [1:05/13/2019] [2:05/13/2019] [3:05/13/2019] Weeks of Treatment: [1:1] [2:1] [3:1] Wound  Status: [1:Open] [2:Open] [3:Open] Measurements L x W x D 1.6x1.4x0.1 [2:1x0.9x0.1] [3:1.2x0.3x0.1] (cm) Area (cm) : [1:1.759] [2:0.707] [3:0.283] Volume (cm) : [1:0.176] [2:0.071] [3:0.028] % Reduction in Area: [1:6.70%] [2:24.90%] [3:44.60%] % Reduction in  Volume: 6.40% [2:24.50%] [3:45.10%] Classification: [1:Full Thickness Without Exposed Support Structures Exposed Support Structures Exposed Support Structures] [2:Full Thickness Without] [3:Full Thickness Without] Exudate Amount: [1:Medium] [2:Medium] [3:Small] Exudate Type: [1:Serosanguineous] [2:Serosanguineous] [3:Serosanguineous] Exudate Color: [1:red, brown] [2:red, brown] [3:red, brown] Wound Margin: [1:Flat and Intact] [2:Flat and Intact] [3:Flat and Intact] Granulation Amount: [1:Medium (34-66%)] [2:Medium (34-66%)] [3:Large (67-100%)] Granulation Quality: [1:Pink] [2:Pink] [3:Pink] Necrotic Amount: [1:Medium (34-66%)] [2:Medium (34-66%)] [3:Small (1-33%)] Exposed Structures: [1:Fat Layer (Subcutaneous Fat Layer (Subcutaneous Fat Layer (Subcutaneous Tissue) Exposed: Yes Fascia: No Tendon: No Muscle: No Joint: No Bone: No] [2:Tissue) Exposed: Yes Fascia: No Tendon: No Muscle: No Joint: No Bone: No] [3:Tissue) Exposed: Yes  Fascia: No Tendon: No Muscle: No Joint: No Bone: No] Epithelialization: [1:None] [2:None] [3:None] Procedures Performed: [1:N/A 7] [2:N/A N/A] [3:N/A N/A] Photos: [1:No Photos] [2:N/A] [3:N/A] Wound Location: [1:Right Malleolus - Medial] [2:N/A] [3:N/A] Wounding Event: [1:Gradually Appeared] [2:N/A] [3:N/A] Primary Etiology: [1:Lymphedema] [2:N/A] [3:N/A] Comorbid History: [1:Cataracts, Lymphedema, N/A Asthma, Congestive Heart Failure, Coronary Artery Disease, Hypertension, Type II Diabetes, Rheumatoid Arthritis, Neuropathy] [3:N/A] Date Acquired: [1:07/31/2019] [2:N/A] [3:N/A] Weeks of Treatment: [1:0] [2:N/A] [3:N/A] Wound Status: [1:Open] [2:N/A] [3:N/A] Measurements L x W x D 1.7x1x0.1 [2:N/A]  [3:N/A] (cm) Area (cm) : [1:1.335] [2:N/A] [3:N/A] Volume (cm) : [1:0.134] [2:N/A] [3:N/A] % Reduction in Area: [1:N/A] [2:N/A] [3:N/A] % Reduction in Volume: N/A [2:N/A] [3:N/A] Classification: [1:Full Thickness Without Exposed Support Structures] [2:N/A] [3:N/A] Exudate Amount: [1:Small] [2:N/A] [3:N/A] Exudate Type: [1:Serous] [2:N/A] [3:N/A] Exudate Color: [1:amber] [2:N/A] [3:N/A] Wound Margin: [1:Distinct, outline attached N/A] [3:N/A] Granulation Amount: [1:Large (67-100%)] [2:N/A] [3:N/A] Granulation Quality: [1:Pale] [2:N/A] [3:N/A] Necrotic Amount: [1:None Present (0%)] [2:N/A] [3:N/A] Exposed Structures: [1:Fat Layer (Subcutaneous N/A Tissue) Exposed: Yes Fascia: No Tendon: No Muscle: No Joint: No Bone: No] [3:N/A] Epithelialization: [1:None] [2:N/A N/A] [3:N/A N/A] Treatment Notes Wound #1 (Right Calcaneus) 2. Periwound Care Moisturizing lotion 3. Primary Dressing Applied Calcium Alginate Ag 4. Secondary Dressing Dry Gauze Heel Cup 6. Support Layer Applied 4 layer compression wrap Wound #3 (Proximal Abdomen - midline) 3. Primary Dressing Applied Calcium Alginate Ag 4. Secondary Dressing ABD Pad 5. Secured With Tape Wound #4 (Right, Medial Abdomen - Lower Quadrant) 3. Primary Dressing Applied Calcium Alginate Ag 4. Secondary Dressing ABD Pad 5. Secured With Tape Wound #5 (Right, Lateral Abdomen - Lower Quadrant) 3. Primary Dressing Applied Calcium Alginate Ag 4. Secondary Dressing ABD Pad 5. Secured With Tape Wound #6 (Distal Abdomen - midline) 3. Primary Dressing Applied Calcium Alginate Ag 4. Secondary Dressing ABD Pad 5. Secured With Tape Wound #7 (Right, Medial Malleolus) 2. Periwound Care Moisturizing lotion 3. Primary Dressing Applied Calcium Alginate Ag 4. Secondary Dressing Dry Gauze Heel Cup 6. Support Layer Applied 4 layer compression Cytogeneticist) Signed: 07/31/2019 6:25:25 PM By: Baltazar Najjar MD Entered By:  Baltazar Najjar on 07/31/2019 13:40:43 -------------------------------------------------------------------------------- Multi-Disciplinary Care Plan Details Patient Name: Date of Service: Gesell, Shyniece M. 07/31/2019 9:00 AM Medical Record [id number] Patient Account Number: [id number] Date of Birth/Sex: [birth year] (58 y.o. Female) Treating RN: Shawn Stall Primary Care Elver Stadler: [name] Other Clinician: Referring Maurica Omura: Treating Leatta Alewine/Extender:Robson, Hermelinda Dellen, Salvadore Dom in Treatment: 1 Active Inactive Pressure Nursing Diagnoses: Knowledge deficit related to causes and risk factors for pressure ulcer development Potential for impaired tissue integrity related to pressure, friction, moisture, and shear Goals: Patient/caregiver will verbalize understanding of pressure ulcer management Date Initiated: 07/23/2019 Target Resolution Date: 08/20/2019 Goal Status: Active Interventions: Assess offloading mechanisms upon admission and as needed Notes: Venous Leg Ulcer Nursing Diagnoses: Knowledge deficit  related to disease process and management Potential for venous Insuffiency (use before diagnosis confirmed) Goals: Patient will maintain optimal edema control Date Initiated: 07/23/2019 Target Resolution Date: 08/20/2019 Goal Status: Active Interventions: Assess peripheral edema status every visit. Compression as ordered Treatment Activities: Therapeutic compression applied : 07/23/2019 Notes: Wound/Skin Impairment Nursing Diagnoses: Impaired tissue integrity Knowledge deficit related to ulceration/compromised skin integrity Goals: Patient/caregiver will verbalize understanding of skin care regimen Date Initiated: 07/23/2019 Target Resolution Date: 08/20/2019 Goal Status: Active Ulcer/skin breakdown will have a volume reduction of 30% by week 4 Date Initiated: 07/23/2019 Target Resolution Date: 08/20/2019 Goal Status:  Active Interventions: Assess patient/caregiver ability to obtain necessary supplies Assess patient/caregiver ability to perform ulcer/skin care regimen upon admission and as needed Assess ulceration(s) every visit Treatment Activities: Skin care regimen initiated : 07/23/2019 Topical wound management initiated : 07/23/2019 Notes: Electronic Signature(s) Signed: 07/31/2019 6:38:14 PM By: Shawn Stall Entered By: Shawn Stall on 07/31/2019 10:10:43 -------------------------------------------------------------------------------- Pain Assessment Details Patient Name: Date of Service: Debski, Leonia M. 07/31/2019 9:00 AM Medical Record [id number] Patient Account Number: [id number] Date of Birth/Sex: [birth year] (58 y.o. Female) Treating RN: Cherylin Mylar Primary Care Khaleb Broz: [name] Other Clinician: Referring Deionna Marcantonio: Treating Mileydi Milsap/Extender:Robson, Hermelinda Dellen, Salvadore Dom in Treatment: 1 Active Problems Location of Pain Severity and Description of Pain Patient Has Paino No Site Locations Pain Management and Medication Current Pain Management: Electronic Signature(s) Signed: 07/31/2019 6:05:31 PM By: Cherylin Mylar Entered By: Cherylin Mylar on 07/31/2019 10:12:33 -------------------------------------------------------------------------------- Patient/Caregiver Education Details Patient Name: Ironside, Tnya M. 11/19/2020andnbsp9:00 Date of Service: AM Medical Record [id number] Number: Patient Account Number: [id number] Treating RN: Date of Birth/Gender: 1961-09-03 (58 y.o. Shawn Stall Female) Other Clinician: Primary Care Treating Wendling, Brigitte Pulse Physician: Physician/Extender: Referring Physician: Philomena Course in Treatment: 1 Education Assessment Education Provided To: Patient Education Topics Provided Wound/Skin Impairment: Handouts: Skin Care Do's and Dont's Methods: Explain/Verbal Responses:  Reinforcements needed Electronic Signature(s) Signed: 07/31/2019 6:38:14 PM By: Shawn Stall Entered By: Shawn Stall on 07/31/2019 10:10:53 -------------------------------------------------------------------------------- Wound Assessment Details Patient Name: Date of Service: Fetsch, Lianah M. 07/31/2019 9:00 AM Medical Record [id number] Patient Account Number: [id number] Date of Birth/Sex: [birth year] (58 y.o. Female) Treating RN: Cherylin Mylar Primary Care Purl Claytor: [name] Other Clinician: Referring Kamdyn Colborn: Treating Chayna Surratt/Extender:Robson, Hermelinda Dellen, Salvadore Dom in Treatment: 1 Wound Status Wound Number: 1 Primary Diabetic Wound/Ulcer of the Lower Extremity Etiology: Wound Location: Right Calcaneus Wound Open Wounding Event: Blister Status: Date Acquired: 09/11/2016 ComorbidCataracts, Lymphedema, Asthma, Congestive Weeks Of Treatment: 1 Weeks Of Treatment: 1 History: Heart Failure, Coronary Artery Disease, Clustered Wound: No Hypertension, Type II Diabetes, Rheumatoid Arthritis, Neuropathy Photos Wound Measurements Length: (cm) 0.6 % Reducti Width: (cm) 1 % Reducti Depth: (cm) 0.1 Epithelia Area: (cm) 0.471 Tunnelin Volume: (cm) 0.047 Undermin Wound Description Classification: Grade 2 Wound Margin: Distinct, outline attached Exudate Amount: Small Exudate Type: Serous Exudate Color: amber Wound Bed Granulation Amount: Large (67-100%) Granulation Quality: Pink, Pale Necrotic Amount: Small (1-33%) Necrotic Quality: Adherent Slough Foul Odor After Cleansing: No Slough/Fibrino Yes Exposed Structure Fascia Exposed: No Fat Layer (Subcutaneous Tissue) Exposed: Yes Tendon Exposed: No Muscle Exposed: No Joint Exposed: No Bone Exposed: No on in Area: 79% on in Volume: 89.5% lization: Small (1-33%) g: No ing: No Treatment Notes Wound #1 (Right Calcaneus) 2. Periwound Care Moisturizing lotion 3. Primary Dressing  Applied Calcium Alginate Ag 4. Secondary Dressing Dry Gauze Heel Cup 6. Support Layer Applied 4 layer compression wrap Electronic Signature(s) Signed: 08/04/2019 2:17:56 PM By: Cherylin Mylar Signed: 08/05/2019 7:37:04  AM By: Benjaman KindlerJones, Dedrick EMT/HBOT Previous Signature: 07/31/2019 6:05:31 PM Version By: Cherylin Mylarwiggins, Shannon Entered By: Benjaman KindlerJones, Dedrick on 08/04/2019 12:38:10 -------------------------------------------------------------------------------- Wound Assessment Details Patient Name: Date of Service: Denherder, Leilany M. 07/31/2019 9:00 AM Medical Record [id number] Patient Account Number: [id number] Date of Birth/Sex: [birth year] (58 y.o. Female) Treating RN: Cherylin Mylarwiggins, Shannon Primary Care Francisco Eyerly: Arva ChafeWendling, Nicholas Other Clinician: Referring Cesilia Shinn: Treating Tanish Sinkler/Extender:Robson, Hermelinda DellenMichael Wendling, Salvadore DomNicholas Weeks in Treatment: 1 Wound Status Wound Number: 2 Primary Lymphedema Etiology: Wound Location: Right Lower Leg - Medial Wound Healed - Epithelialized Wounding Event: Blister Status: Date Acquired: 07/21/2019 Comorbid Cataracts, Lymphedema, Asthma, Congestive Weeks Of Treatment: 1 History: Heart Failure, Coronary Artery Disease, Clustered Wound: No Hypertension, Type II Diabetes, Rheumatoid Arthritis, Neuropathy Photos Wound Measurements Length: (cm) 0 % Reduct Width: (cm) 0 % Reduct Depth: (cm) 0 Epitheli Area: (cm) 0 Tunneli Volume: (cm) 0 Undermi Wound Description Classification: Full Thickness Without Exposed Support Foul Odo Structures Slough/F Wound Flat and Intact Margin: Exudate None Present Amount: Wound Bed Granulation Amount: None Present (0%) Necrotic Amount: None Present (0%) Fascia E Fat Laye Tendon E Muscle E Joint Ex Bone Exp Electronic Signature(s) Signed: 08/04/2019 2:17:56 PM By: Cherylin Mylarwiggins, Shannon Signed: 08/05/2019 7:37:04 AM By: Benjaman KindlerJones, Dedrick EMT/HBOT Previous Signature: 07/31/2019 6:05:31 PM Version By:  Currie Pariswiggins, S Entered ByBenjaman Kindler: Jones, Dedrick on 11/23 r After Cleansing: No ibrino No Exposed Structure xposed: No r (Subcutaneous Tissue) Exposed: No xposed: No xposed: No posed: No osed: No hannon /2020 12:37:52 ion in Area: 100% ion in Volume: 100% alization: Large (67-100%) ng: No ning: No -------------------------------------------------------------------------------- Wound Assessment Details Patient Name: Date of Service: Mealey, Miachel RouxDAWN M. 07/31/2019 9:00 AM Medical Record [id number] Patient Account Number: [id number] Date of Birth/Sex: [birth year] (58 y.o. Female) Treating RN: Cherylin Mylarwiggins, Shannon Primary Care Everado Pillsbury: Arva ChafeWendling, Nicholas Other Clinician: Referring Ivalene Platte: Treating Calleigh Lafontant/Extender:Robson, Hermelinda DellenMichael Wendling, Salvadore DomNicholas Weeks in Treatment: 1 Wound Status Wound Number: 3 Primary Atypical Etiology: Wound Location: Abdomen - midline - Proximal Wound Open Wounding Event: Gradually Appeared Status: Date Acquired: 05/13/2019 Comorbid Cataracts, Lymphedema, Asthma, Congestive Weeks Of Treatment: 1 History: Heart Failure, Coronary Artery Disease, Clustered Wound: No Hypertension, Type II Diabetes, Rheumatoid Arthritis, Neuropathy Photos Wound Measurements Length: (cm) 0.7 % Reduct Width: (cm) 0.9 % Reduct Depth: (cm) 0.1 Epitheli Area: (cm) 0.495 Tunneli Volume: (cm) 0.049 Undermi Wound Description Classification: Full Thickness Without Exposed Support Foul Odo Structures Slough/F Wound Flat and Intact Margin: Exudate Medium Amount: Exudate Serosanguineous Type: Exudate red, brown red, brown Color: Wound Bed Granulation Amount: Small (1-33%) Granulation Quality: Pink Fascia Exp Necrotic Amount: Large (67-100%) Fat Layer Necrotic Quality: Adherent Slough Tendon Exp Muscle Exp Joint Expo Bone Expos r After Cleansing: No ibrino Yes Exposed Structure osed: No (Subcutaneous Tissue) Exposed: Yes osed: No osed: No sed: No ed: No ion  in Area: 1.6% ion in Volume: 2% alization: None ng: No ning: No Treatment Notes Wound #3 (Proximal Abdomen - midline) 3. Primary Dressing Applied Calcium Alginate Ag 4. Secondary Dressing ABD Pad 5. Secured With Secretary/administratorTape Electronic Signature(s) Signed: 08/04/2019 2:17:56 PM By: Cherylin Mylarwiggins, Shannon Signed: 08/05/2019 7:37:04 AM By: Benjaman KindlerJones, Dedrick EMT/HBOT Previous Signature: 07/31/2019 6:05:31 PM Version By: Cherylin Mylarwiggins, Shannon Entered By: Benjaman KindlerJones, Dedrick on 08/04/2019 12:37:02 -------------------------------------------------------------------------------- Wound Assessment Details Patient Name: Date of Service: Every, Fayola M. 07/31/2019 9:00 AM Medical Record [id number] Patient Account Number: [id number] Date of Birth/Sex: [birth year] (58 y.o. Female) Treating RN: Cherylin Mylarwiggins, Shannon Primary Care Serena Petterson: Arva ChafeWendling, Nicholas Other Clinician: Referring Drea Jurewicz: Treating Cezar Misiaszek/Extender:Robson, Hermelinda DellenMichael Wendling, Salvadore DomNicholas Weeks in Treatment: 1 Wound Status Wound Number:  4 Primary Atypical Etiology: Wound Location: Right Abdomen - Lower Quadrant - Medial Wound Open Status: Wounding Event: Gradually Appeared Comorbid Cataracts, Lymphedema, Asthma, Congestive Date Acquired: 05/13/2019 History: Heart Failure, Coronary Artery Disease, Weeks Of Treatment: 1 Hypertension, Type II Diabetes, Rheumatoid Clustered Wound: No Arthritis, Neuropathy Photos Wound Measurements Length: (cm) 1.6 % Reduct Width: (cm) 1.4 % Reduct Depth: (cm) 0.1 Epitheli Area: (cm) 1.759 Tunneli Volume: (cm) 0.176 Undermi Wound Description Classification: Full Thickness Without Exposed Support Foul Odo Structures Slough/F Wound Flat and Intact Margin: Exudate Medium Amount: Exudate Serosanguineous Type: Exudate red, brown Color: Wound Bed Granulation Amount: Medium (34-66%) Granulation Quality: Pink Fascia E Necrotic Amount: Medium (34-66%) Fat Laye Necrotic Quality: Adherent Slough Tendon  E Muscle E Joint Ex Bone Exp r After Cleansing: No ibrino Yes Exposed Structure xposed: No r (Subcutaneous Tissue) Exposed: Yes xposed: No xposed: No posed: No osed: No ion in Area: 6.7% ion in Volume: 6.4% alization: None ng: No ning: No Treatment Notes Wound #4 (Right, Medial Abdomen - Lower Quadrant) 3. Primary Dressing Applied Calcium Alginate Ag 4. Secondary Dressing ABD Pad 5. Secured With Secretary/administrator) Signed: 08/04/2019 2:17:56 PM By: Cherylin Mylar Signed: 08/05/2019 7:37:04 AM By: Benjaman Kindler EMT/HBOT Previous Signature: 07/31/2019 6:05:31 PM Version By: Cherylin Mylar Entered By: Benjaman Kindler on 08/04/2019 12:35:42 -------------------------------------------------------------------------------- Wound Assessment Details Patient Name: Date of Service: Maxon, Imonie M. 07/31/2019 9:00 AM Medical Record [id number] Patient Account Number: [id number] Date of Birth/Sex: 1960-09-28 (58 y.o. Female) Treating RN: Cherylin Mylar Primary Care Danae Oland: [name] Other Clinician: Referring Davonna Ertl: Treating Exie Chrismer/Extender:Robson, Hermelinda Dellen, Salvadore Dom in Treatment: 1 Wound Status Wound Number: 5 Primary Atypical Etiology: Wound Location: Right Abdomen - Lower Quadrant - Lateral Wound Open Status: Wounding Event: Gradually Appeared Comorbid Cataracts, Lymphedema, Asthma, Congestive Date Acquired: 05/13/2019 History: Heart Failure, Coronary Artery Disease, Weeks Of Treatment: 1 Hypertension, Type II Diabetes, Rheumatoid Clustered Wound: No Arthritis, Neuropathy Photos Wound Measurements Length: (cm) 1 % Reduct Width: (cm) 0.9 % Reduct Depth: (cm) 0.1 Epitheli Area: (cm) 0.707 Tunneli Volume: (cm) 0.071 Undermi Wound Description Full Thickness Without Exposed Support Foul Odo Classification: Structures Slough/F Wound Flat and Intact Margin: Exudate Medium Amount: Exudate  Serosanguineous Type: Exudate red, brown Color: Wound Bed Granulation Amount: Medium (34-66%) Granulation Quality: Pink Fascia E Necrotic Amount: Medium (34-66%) Fat Laye Necrotic Quality: Adherent Slough Tendon E Muscle E Joint Expo Bone Expos r After Cleansing: No ibrino Yes Exposed Structure xposed: No r (Subcutaneous Tissue) Exposed: Yes xposed: No xposed: No sed: No ed: No ion in Area: 24.9% ion in Volume: 24.5% alization: None ng: No ning: No Treatment Notes Wound #5 (Right, Lateral Abdomen - Lower Quadrant) 3. Primary Dressing Applied Calcium Alginate Ag 4. Secondary Dressing ABD Pad 5. Secured With Secretary/administrator) Signed: 08/04/2019 2:17:56 PM By: Cherylin Mylar Signed: 08/05/2019 7:37:04 AM By: Benjaman Kindler EMT/HBOT Previous Signature: 07/31/2019 6:05:31 PM Version By: Cherylin Mylar Entered By: Benjaman Kindler on 08/04/2019 12:33:39 -------------------------------------------------------------------------------- Wound Assessment Details Patient Name: Date of Service: Westergren, Jahna M. 07/31/2019 9:00 AM Medical Record [id number] Patient Account Number: [id number] Date of Birth/Sex: [birth year] (58 y.o. Female) Treating RN: Cherylin Mylar Primary Care Patrick Sohm: [name] Other Clinician: Referring Jeremiyah Cullens: Treating Ronrico Dupin/Extender:Robson, Hermelinda Dellen, Salvadore Dom in Treatment: 1 Wound Status Wound Number: 6 Primary Atypical Etiology: Wound Location: Abdomen - midline - Distal Wound Open Wounding Event: Gradually Appeared Status: Date Acquired: 05/13/2019 Comorbid Cataracts, Lymphedema, Asthma, Congestive Weeks Of Treatment: 1 History: Heart Failure, Coronary Artery Disease, Clustered Wound:  No Hypertension, Type II Diabetes, Rheumatoid Arthritis, Neuropathy Photos Wound Measurements Length: (cm) 1.2 % Reductio Width: (cm) 0.3 % Reductio Depth: (cm) 0.1 Epithelial Area: (cm) 0.283  Tunneli Volume: (cm) 0.028 Undermi Wound Description Full Thickness Without Exposed Support Foul Od Classification: Structures Slough/ Wound Flat and Intact Margin: Exudate Small Amount: Exudate Serosanguineous Type: Exudate red, brown Color: Wound Bed Granulation Amount: Large (67-100%) Granulation Quality: Pink Fascia E Necrotic Amount: Small (1-33%) Fat Laye Necrotic Quality: Adherent Slough Tendon E Muscle E Joint Ex Bone Exp or After Cleansing: No Fibrino Yes Exposed Structure xposed: No r (Subcutaneous Tissue) Exposed: Yes xposed: No xposed: No posed: No osed: No n in Area: 44.6% n in Volume: 45.1% ization: None ng: No ning: No Treatment Notes Wound #6 (Distal Abdomen - midline) 3. Primary Dressing Applied Calcium Alginate Ag 4. Secondary Dressing ABD Pad 5. Secured With Recruitment consultant) Signed: 08/04/2019 2:17:56 PM By: Kela Millin Signed: 08/05/2019 7:37:04 AM By: Mikeal Hawthorne EMT/HBOT Previous Signature: 07/31/2019 6:05:31 PM Version By: Kela Millin Entered By: Mikeal Hawthorne on 08/04/2019 12:33:14 -------------------------------------------------------------------------------- Wound Assessment Details Patient Name: Date of Service: Beste, Vincie M. 07/31/2019 9:00 AM Medical Record [id number] Patient Account Number: [id number] Date of Birth/Sex: August 25, 1961 (58 y.o. Female) Treating RN: Kela Millin Primary Care Lin Glazier: [name] Other Clinician: Referring Kaylanie Capili: Treating Charron Coultas/Extender:Robson, Arlis Porta, Archie Patten in Treatment: 1 Wound Status Wound Number: 7 Primary Lymphedema Etiology: Wound Location: Right Malleolus - Medial Wound Open Wounding Event: Gradually Appeared Status: Status: Date Acquired: 07/31/2019 Comorbid Cataracts, Lymphedema, Asthma, Congestive Weeks Of Treatment: 0 History: Heart Failure, Coronary Artery Disease, Clustered Wound: No Hypertension,  Type II Diabetes, Rheumatoid Arthritis, Neuropathy Photos Wound Measurements Length: (cm) 1.7 % Reduct Width: (cm) 1 % Reduct Depth: (cm) 0.1 Epitheli Area: (cm) 1.335 Tunneli Volume: (cm) 0.134 Undermi Wound Description Full Thickness Without Exposed Support Foul Odo Classification: Structures Slough/F Wound Distinct, outline attached Margin: Exudate Small Amount: Exudate Serous Type: Exudate amber Color: Wound Bed Granulation Amount: Large (67-100%) Granulation Quality: Pale Fascia E Necrotic Amount: None Present (0%) Fat Laye Tendon E Muscle E Joint Ex Bone Exp r After Cleansing: No ibrino No Exposed Structure xposed: No r (Subcutaneous Tissue) Exposed: Yes xposed: No xposed: No posed: No osed: No ion in Area: 0% ion in Volume: 0% alization: None ng: No ning: No Treatment Notes Wound #7 (Right, Medial Malleolus) 2. Periwound Care Moisturizing lotion 3. Primary Dressing Applied Calcium Alginate Ag 4. Secondary Dressing Dry Gauze Heel Cup 6. Support Layer Applied 4 layer compression wrap Electronic Signature(s) Signed: 08/04/2019 2:17:56 PM By: Kela Millin Signed: 08/05/2019 7:37:04 AM By: Mikeal Hawthorne EMT/HBOT Previous Signature: 07/31/2019 6:05:31 PM Version By: Kela Millin Entered By: Mikeal Hawthorne on 08/04/2019 12:37:21 -------------------------------------------------------------------------------- Vitals Details Patient Name: Date of Service: Poynter, Juliene M. 07/31/2019 9:00 AM Medical Record [id number] Patient Account Number: [id number] Date of Birth/Sex: 1961-01-21 (58 y.o. Female) Treating RN: Kela Millin Primary Care Navaeh Kehres: [name] Other Clinician: Referring Chozen Latulippe: Treating Lekeya Rollings/Extender:Robson, Arlis Porta, Archie Patten in Treatment: 1 Vital Signs Time Taken: 10:11 Temperature (F): 98.3 Height (in): 62 Pulse (bpm): 108 Weight (lbs): 300 Respiratory Rate (breaths/min):  19 Body Mass Index (BMI): 54.9 Blood Pressure (mmHg): 169/72 Capillary Blood Glucose (mg/dl): 157 Reference Range: 80 - 120 mg / dl Notes patient reported CBG of 157 this morning Electronic Signature(s) Signed: 07/31/2019 6:05:31 PM By: Kela Millin Entered By: Kela Millin on 07/31/2019 10:12:27

## 2019-08-01 ENCOUNTER — Other Ambulatory Visit: Payer: Self-pay

## 2019-08-01 ENCOUNTER — Encounter (HOSPITAL_BASED_OUTPATIENT_CLINIC_OR_DEPARTMENT_OTHER): Payer: Self-pay | Admitting: *Deleted

## 2019-08-01 ENCOUNTER — Telehealth: Payer: Self-pay | Admitting: Cardiology

## 2019-08-01 ENCOUNTER — Emergency Department (HOSPITAL_BASED_OUTPATIENT_CLINIC_OR_DEPARTMENT_OTHER)
Admission: EM | Admit: 2019-08-01 | Discharge: 2019-08-01 | Disposition: A | Discharge status: Home / Self Care | Payer: BC Managed Care – PPO | Attending: Emergency Medicine | Admitting: Emergency Medicine

## 2019-08-01 ENCOUNTER — Emergency Department (HOSPITAL_BASED_OUTPATIENT_CLINIC_OR_DEPARTMENT_OTHER): Payer: BC Managed Care – PPO

## 2019-08-01 DIAGNOSIS — J45909 Unspecified asthma, uncomplicated: Secondary | ICD-10-CM | POA: Diagnosis not present

## 2019-08-01 DIAGNOSIS — Z794 Long term (current) use of insulin: Secondary | ICD-10-CM | POA: Insufficient documentation

## 2019-08-01 DIAGNOSIS — I2510 Atherosclerotic heart disease of native coronary artery without angina pectoris: Secondary | ICD-10-CM | POA: Insufficient documentation

## 2019-08-01 DIAGNOSIS — R079 Chest pain, unspecified: Secondary | ICD-10-CM | POA: Diagnosis present

## 2019-08-01 DIAGNOSIS — Z87891 Personal history of nicotine dependence: Secondary | ICD-10-CM | POA: Insufficient documentation

## 2019-08-01 DIAGNOSIS — Z79899 Other long term (current) drug therapy: Secondary | ICD-10-CM | POA: Diagnosis not present

## 2019-08-01 DIAGNOSIS — R0609 Other forms of dyspnea: Secondary | ICD-10-CM

## 2019-08-01 DIAGNOSIS — E119 Type 2 diabetes mellitus without complications: Secondary | ICD-10-CM | POA: Diagnosis not present

## 2019-08-01 DIAGNOSIS — R0600 Dyspnea, unspecified: Secondary | ICD-10-CM | POA: Insufficient documentation

## 2019-08-01 DIAGNOSIS — I10 Essential (primary) hypertension: Secondary | ICD-10-CM | POA: Diagnosis not present

## 2019-08-01 DIAGNOSIS — E039 Hypothyroidism, unspecified: Secondary | ICD-10-CM | POA: Diagnosis not present

## 2019-08-01 DIAGNOSIS — R0789 Other chest pain: Secondary | ICD-10-CM | POA: Insufficient documentation

## 2019-08-01 DIAGNOSIS — Z7902 Long term (current) use of antithrombotics/antiplatelets: Secondary | ICD-10-CM | POA: Diagnosis not present

## 2019-08-01 LAB — COMPREHENSIVE METABOLIC PANEL
ALT: 41 U/L (ref 0–44)
AST: 44 U/L — ABNORMAL HIGH (ref 15–41)
Albumin: 3.8 g/dL (ref 3.5–5.0)
Alkaline Phosphatase: 61 U/L (ref 38–126)
Anion gap: 14 (ref 5–15)
BUN: 12 mg/dL (ref 6–20)
CO2: 23 mmol/L (ref 22–32)
Calcium: 8.9 mg/dL (ref 8.9–10.3)
Chloride: 102 mmol/L (ref 98–111)
Creatinine, Ser: 0.87 mg/dL (ref 0.44–1.00)
GFR calc Af Amer: >60 mL/min (ref >60)
GFR calc non Af Amer: >60 mL/min (ref >60)
Glucose, Bld: 137 mg/dL — ABNORMAL HIGH (ref 70–99)
Potassium: 3.6 mmol/L (ref 3.5–5.1)
Sodium: 139 mmol/L (ref 135–145)
Total Bilirubin: 0.5 mg/dL (ref 0.3–1.2)
Total Protein: 7.2 g/dL (ref 6.5–8.1)

## 2019-08-01 LAB — TROPONIN I (HIGH SENSITIVITY)
Troponin I (High Sensitivity): 5 ng/L (ref <18)
Troponin I (High Sensitivity): 5 ng/L (ref <18)

## 2019-08-01 LAB — CBC WITH DIFFERENTIAL/PLATELET
Abs Immature Granulocytes: 0.01 10*3/uL (ref 0.00–0.07)
Basophils Absolute: 0.1 10*3/uL (ref 0.0–0.1)
Basophils Relative: 1 %
Eosinophils Absolute: 1.6 10*3/uL — ABNORMAL HIGH (ref 0.0–0.5)
Eosinophils Relative: 16 %
HCT: 41.7 % (ref 36.0–46.0)
Hemoglobin: 12.4 g/dL (ref 12.0–15.0)
Immature Granulocytes: 0 %
Lymphocytes Relative: 37 %
Lymphs Abs: 3.7 10*3/uL (ref 0.7–4.0)
MCH: 25.4 pg — ABNORMAL LOW (ref 26.0–34.0)
MCHC: 29.7 g/dL — ABNORMAL LOW (ref 30.0–36.0)
MCV: 85.3 fL (ref 80.0–100.0)
Monocytes Absolute: 0.7 10*3/uL (ref 0.1–1.0)
Monocytes Relative: 7 %
Neutro Abs: 3.9 10*3/uL (ref 1.7–7.7)
Neutrophils Relative %: 39 %
Platelets: 270 10*3/uL (ref 150–400)
RBC: 4.89 MIL/uL (ref 3.87–5.11)
RDW: 15.8 % — ABNORMAL HIGH (ref 11.5–15.5)
WBC: 10 10*3/uL (ref 4.0–10.5)
nRBC: 0 % (ref 0.0–0.2)

## 2019-08-01 LAB — BRAIN NATRIURETIC PEPTIDE: B Natriuretic Peptide: 25.4 pg/mL (ref 0.0–100.0)

## 2019-08-01 MED ORDER — PREDNISONE 20 MG PO TABS: 40.0000 mg | ORAL_TABLET | Freq: Every day | ORAL | 0 refills | Status: DC

## 2019-08-01 MED ORDER — PREDNISONE 20 MG PO TABS
40.0000 mg | ORAL_TABLET | Freq: Once | ORAL | Status: AC
  Administered 2019-08-01: 40 mg via ORAL
  Filled 2019-08-01: qty 2

## 2019-08-01 MED ORDER — ALBUTEROL SULFATE HFA 108 (90 BASE) MCG/ACT IN AERS
2.0000 | INHALATION_SPRAY | RESPIRATORY_TRACT | Status: DC | PRN
  Administered 2019-08-01: 2 via RESPIRATORY_TRACT
  Filled 2019-08-01: qty 6.7

## 2019-08-01 NOTE — ED Notes (Signed)
Pt standing in room with husband by her stating she has a leg cramp. She states she gets them "a lot".

## 2019-08-01 NOTE — ED Provider Notes (Signed)
[facility] DEPARTMENT Provider Note   CSN: [id number] Arrival date & time: 08/01/19  1557     History   Chief Complaint Chief Complaint  Patient presents with   Chest Pain    HPI [name] is a 58 y.o. female.     Patient is a 58 year old female with a history of coronary artery disease, chronic pain, rheumatoid arthritis, asthma, morbid obesity, chronic diastolic CHF, hypertension and diabetes who is presenting today with gradually worsening shortness of breath.  Patient states that for months now she has had exertional dyspnea but it seems to have worsened over the last 2 weeks.  She has been using her inhaler more often but does not feel like it is significantly helping.  She now cannot walk from one room of her house to the next without becoming winded.  She denies any symptoms of orthopnea but does not know if she has had any weight gain and her bilateral legs have been wrapped for wound care and feels that since that is been going on she has had more swelling of her knees.  She has had a dry cough but no sputum production.  No fever.  2 days ago she did have vomiting and diarrhea with poor oral intake but that has improved.  For some time now she has had very poor sleep because she states her chronic pain is severe and she cannot get comfortable.  She is taking long-acting OxyContin but feels like she needs to be on a higher dose.  She did call her cardiologist today because she states in the past when she has had symptoms like this she has had a blockage that required stents but they recommended she come to the emergency room.  She does occasionally get very sharp pains in the left side of her chest that do not seem to be exertional or at any particular time but only lasts for seconds.  She has had no central pressure-like chest pain that stayed for any length of time.  She denies any abdominal pain or recent medication changes.  The history is provided by the  patient.  Chest Pain   Past Medical History:  Diagnosis Date   Asthma    "on daily RX and rescue inhaler" (04/18/2018)   Chronic diastolic CHF (congestive heart failure) (HCC) 09/2015   Chronic lower back pain    Chronic neck pain    Chronic pain syndrome    Fentanyl Patch   Colon polyps    Coronary artery disease    cath with normal LM, 30% LAD, 85% mid RCA and 95% distal RCA s/p PCI of the mid to distal RCA and now on DAPT with ASA and Ticagrelor.     Eczema    Excessive daytime sleepiness 11/26/2015   Fibromyalgia    Gallstones    GERD (gastroesophageal reflux disease)    Heart murmur    "noted for the 1st time on 04/18/2018"   History of blood transfusion 07/2010   "S/P oophorectomy"   History of gout    History of hiatal hernia 1980s   "gone now" (04/18/2018)   Hyperlipidemia    Hypertension    takes Metoprolol and Enalapril daily   Hypothyroidism    takes Synthroid daily   IBS (irritable bowel syndrome)    Migraine    "nothing in the 2000s" (04/18/2018)   Mixed connective tissue disease ([place])    NAFLD (nonalcoholic fatty liver disease)    Pneumonia    "  several times" (04/18/2018)   Rheumatoid arthritis (HCC)    "hands, elbows, shoulders, probably knees" (04/18/2018)   Scoliosis    Spondylosis    Type II diabetes mellitus (HCC)    takes Metformin and Hum R daily (04/18/2018)    Patient Active Problem List   Diagnosis Date Noted   PAF (paroxysmal atrial fibrillation) (HCC) 01/06/2019   Gram-negative bacteremia 10/18/2018   Streptococcal bacteremia 10/18/2018   Ulcer of lower extremity, limited to breakdown of skin (HCC) 10/03/2018   CAD S/P percutaneous coronary angioplasty 04/19/2018   Essential hypertension    Moderate persistent asthma 03/21/2018   NAFLD (nonalcoholic fatty liver disease)    Recurrent cellulitis of lower extremity 01/02/2018   Cellulitis of lower leg 01/02/2018   Chronic diarrhea 05/08/2017   H/O Clostridium  difficile infection 05/08/2017   Rectal bleeding 05/08/2017   Hyperbilirubinemia 04/29/2016   Hyponatremia 04/29/2016   Cellulitis of leg, right 03/09/2016   Mild persistent asthma 02/15/2016   Allergic rhinitis due to pollen 02/15/2016   Anaphylactic reaction due to food 02/10/2016   Coronary artery disease involving native coronary artery of native heart without angina pectoris 02/10/2016   Gastroesophageal reflux disease without esophagitis 02/10/2016   Atopic eczema 02/10/2016   Cervical nerve root disorder 12/15/2015   Lumbar radiculopathy 12/15/2015   Daytime somnolence 11/26/2015   Venous stasis dermatitis of both lower extremities 02/11/2015   Morbid obesity (HCC) 06/19/2014   B-complex deficiency 03/26/2014   Benign essential HTN 03/26/2014   Chronic pain associated with significant psychosocial dysfunction 03/26/2014   Diaphragmatic hernia 03/26/2014   Gastroesophageal reflux disease 03/26/2014   Hammer toe 03/26/2014   H/O neoplasm 03/26/2014   Adaptive colitis 03/26/2014   Diabetic polyneuropathy (HCC) 03/26/2014   Deafness, sensorineural 03/26/2014   Fibromyalgia 01/20/2014   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/20/2014   Insulin dependent diabetes mellitus (HCC) 11/11/2012   Hyperlipidemia LDL goal <70 11/11/2012   Mixed connective tissue disease (HCC) 11/11/2012   Hypothyroidism 11/11/2012   Uncomplicated asthma 11/11/2012   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HCC) 02/20/2012   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     CORONARY STENT INTERVENTION Right 04/18/2018   Procedure: CORONARY STENT INTERVENTION;  Surgeon: [name], MD;  Location: [facility];  Service: Cardiovascular;  Laterality: Right;   DILATION AND CURETTAGE OF UTERUS     FRACTURE SURGERY     LAPAROSCOPIC CHOLECYSTECTOMY     LEFT HEART CATH AND CORONARY ANGIOGRAPHY N/A 04/18/2018   Procedure: LEFT HEART CATH AND CORONARY ANGIOGRAPHY;  Surgeon: [name], MD;  Location: [facility];  Service: Cardiovascular;  Laterality: N/A;   MUSCLE BIOPSY Left    "leg"   OOPHORECTOMY  07/2010   RADIOLOGY WITH ANESTHESIA N/A 05/25/2016   Procedure: RADIOLOGY WITH ANESTHESIA;  Surgeon: Medication Radiologist, MD;  Location: [facility] OR;  Service: Radiology;  Laterality: N/A;   TONSILLECTOMY AND ADENOIDECTOMY     WRIST FRACTURE SURGERY Left    "crushed it"  OB History   No obstetric history on file.      Home Medications    Prior to Admission medications   Medication Sig Start Date End Date Taking? Authorizing Provider  aspirin EC 81 MG tablet Take 81 mg by mouth daily.    [provider]  augmented betamethasone dipropionate (DIPROLENE-AF) 0.05 % cream APPLY 1 CREAM TOPICALLY ONCE DAILY AS NEEDED FOR  ECZEMA Patient taking differently: Apply 1 application topically daily as needed (eczema).  06/13/18   Sharlene Dory, DO  BREO ELLIPTA 200-25 MCG/INH AEPB Inhale 1 puff by mouth once daily 06/06/19   Sharlene Dory, DO  clopidogrel (PLAVIX) 75 MG tablet Take 1 tablet by mouth once daily with breakfast 04/02/19   Quintella Reichert, MD  diltiazem (CARDIZEM CD) 300 MG 24 hr capsule Take 1 capsule (300 mg total) by mouth daily. 03/28/19 03/27/20  Quintella Reichert, MD  enalapril (VASOTEC) 10 MG tablet Take 1 tablet (10 mg total) by mouth 2 (two) times daily. 04/23/19   Dyann Kief, PA-C  EPINEPHrine (EPIPEN 2-PAK) 0.3 mg/0.3 mL IJ SOAJ injection USE  AS DIRECTED FOR SEVERE ALLERGIC REACTION. 10/30/16   Fletcher Anon, MD  fluticasone (FLONASE) 50 MCG/ACT nasal spray USE 2 SPRAY(S) IN EACH NOSTRIL ONCE DAILY AS NEEDED FOR  ALLERGIES  OR  RHINITIS 07/18/19   Carmelia Roller, Jilda Roche, DO  furosemide (LASIX) 40 MG tablet Take 1.5 tablets (60 mg total) by mouth 2 (two) times daily for 30 days. Patient taking differently: Take 60 mg by mouth as needed.  10/21/18 04/23/19  Tyrone Nine, MD  gabapentin (NEURONTIN) 100 MG capsule Take 400-700 mg by mouth See admin instructions. Pt takes  in the morning,  at lunchtime and  in the evening    [provider]  GLUCOMANNAN PO Take 700 mg by mouth 2 (two) times daily.    [provider]  glucose blood (BAYER CONTOUR TEST) test strip 1 each 4 (four) times daily.  03/28/17   [provider]  Guaifenesin (MUCINEX MAXIMUM STRENGTH) 1200 MG TB12 Take 1,200 mg by mouth 2 (two) times daily.    [provider]  hydroxychloroquine (PLAQUENIL) 200 MG tablet Take 200 mg by mouth 2 (two) times daily.    [provider]  insulin regular human CONCENTRATED (HUMULIN R) 500 UNIT/ML injection Inject 0-100 Units into the skin 4 (four) times daily -  before meals and at bedtime. Breakfast and lunch <100-125= 5 units 125-150= 35 u 151-200 = 45 u 201-249 = 55 u 250 - 300 = 65 u 301 - 349 = 75 u 350 - 400 = 85 u 401 - 449 = 95 u 450 - 500 = 105 u  bedtime doses <M100 - 150 = 10 u 151-200= 35 u 201 - 300 = 45u 301- 400 = 55 u 401 - 500 = 65 u    [provider]  ipratropium-albuterol (DUONEB) 0.5-2.5 (3) MG/3ML SOLN USE 3 ML IN NEBULIZER EVERY 4 HOURS AS NEEDED 04/02/19   Wendling, Jilda Roche, DO  isosorbide mononitrate (IMDUR) 30 MG 24 hr tablet Take 1 tablet (30 mg total) by mouth daily. 03/17/19 03/16/20  Quintella Reichert, MD  levocetirizine (XYZAL) 5 MG tablet TAKE 1 TABLET BY MOUTH ONCE DAILY IN THE EVENING 07/29/19   Wendling, Jilda Roche, DO    levothyroxine (SYNTHROID, LEVOTHROID) 200 MCG tablet Take 200 mcg by mouth daily before breakfast. Except 100 mcg on Monday    [provider]  metFORMIN (GLUCOPHAGE-XR) 500 MG 24 hr tablet Take 2 tablets (1,000 mg total) by mouth 2 (two) times daily. 04/21/18   Robbie LisSimmons, Brittainy M, PA-C  mupirocin ointment (BACTROBAN) 2 % APPLY OINTMENT INTO THE NOSE 2(TWO) TIMES DAILY 07/03/19   Wendling, Jilda RocheNicholas Paul, DO  nitroGLYCERIN (NITROSTAT) 0.4 MG SL tablet DISSOLVE ONE TABLET UNDER THE TONGUE EVERY 5 MINUTES AS NEEDED FOR CHEST PAIN.  DO NOT EXCEED A TOTAL OF 3 DOSES IN 15 MINUTES 08/01/19   Robbie LisSimmons, Brittainy M, PA-C  pantoprazole (PROTONIX) 40 MG tablet Take 1 tablet by mouth once daily 01/28/19   Robbie LisSimmons, Brittainy M, PA-C  Polyethyl Glycol-Propyl Glycol (SYSTANE ULTRA OP) Apply 1 drop to eye daily as needed (dry eyes).    [provider]  pramipexole (MIRAPEX) 0.5 MG tablet Take 0.5 mg by mouth 2 (two) times daily.    [provider]  PROAIR HFA 108 [phone](90 Base) MCG/ACT inhaler INHALE 2 PUFFS BY MOUTH EVERY 4 HOURS AS NEEDED FOR WHEEZING AND FOR SHORTNESS OF BREATH 07/29/19   Wendling, Jilda RocheNicholas Paul, DO  promethazine (PHENERGAN) 25 MG tablet TAKE 1 TABLET BY MOUTH EVERY 8 HOURS AS NEEDED FOR NAUSEA AND FOR VOMITING 06/19/19   Wendling, Jilda RocheNicholas Paul, DO  pyridoxine (B-6) 100 MG tablet Take 100 mg by mouth 2 (two) times daily.    [provider]  saccharomyces boulardii (FLORASTOR) 250 MG capsule Take 250 mg by mouth 3 (three) times daily.    [provider]  silver sulfADIAZINE (SILVADENE) 1 % cream APPLY  CREAM ONCE DAILY 01/06/19   Sharlene DoryWendling, Nicholas Paul, DO  vitamin A [phone] UNIT capsule Take 10,000 Units by mouth daily.    [provider]  vitamin B-12 (CYANOCOBALAMIN) 1000 MCG tablet Take 1,000 mcg by mouth daily.      [provider]  Vitamin D, Ergocalciferol, (DRISDOL) 50000 units CAPS capsule 50,000 Units every 7 (seven) days. Take on  Friday 07/25/17   Sharlene DoryWendling, Nicholas Paul, DO  mupirocin ointment (BACTROBAN) 2 % APPLY OINTMENT INTO THE NOSE 2(TWO) TIMES DAILY 05/07/19   Sharlene DoryWendling, Nicholas Paul, DO    Family History Family History  Problem Relation Age of Onset   CAD Mother    Hypertension Mother    Heart attack Mother    CAD Father    Heart attack Father    Allergic rhinitis Father    Asthma Father    Hypertension Brother    Hypertension Brother    Pancreatic cancer Paternal Aunt    Breast cancer Paternal Aunt    Lung cancer Paternal Aunt     Social History Social History   Tobacco Use   Smoking status: Former Smoker    Packs/day: 1.00    Years: 20.00    Pack years: 20.00    Types: Cigarettes    Quit date: 02/11/2004    Years since quitting: 15.4   Smokeless tobacco: Never Used  Substance Use Topics   Alcohol use: Yes    Comment: 04/18/2018 "couple drinks/year"   Drug use: Not Currently    Types: Marijuana    Comment: "only in my teens"     Allergies   Fish allergy, Fish oil, Omega-3 fatty acids, Other, Crestor [rosuvastatin], Gabapentin, Lovastatin, Metronidazole, Monascus purpureus went yeast, Red yeast rice [cholestin], Rotigotine, Atrovent hfa [ipratropium bromide hfa], Suprep [na sulfate-k sulfate-mg sulf], and Tape   Review of Systems Review of Systems  Cardiovascular: Positive for chest pain.  All other systems reviewed and are negative.  Physical Exam Updated Vital Signs BP 131/64    Pulse 100    Temp 98.3 F (36.8 C)    Resp 18    Ht 5\' 2"  (1.575 m)    Wt (!) 145.6 kg    SpO2 97%    BMI 58.71 kg/m   Physical Exam Vitals signs and nursing note reviewed.  Constitutional:      General: She is not in acute distress.    Appearance: She is well-developed. She is obese.  HENT:     Head: Normocephalic and atraumatic.  Eyes:     Pupils: Pupils are equal, round, and reactive to light.  Cardiovascular:     Rate and Rhythm: Regular rhythm. Tachycardia present.      Pulses: Normal pulses.     Heart sounds: Normal heart sounds. No murmur. No friction rub.  Pulmonary:     Effort: Pulmonary effort is normal.     Breath sounds: Decreased breath sounds present. No wheezing or rales.  Abdominal:     General: Bowel sounds are normal. There is no distension.     Palpations: Abdomen is soft.     Tenderness: There is no abdominal tenderness. There is no guarding or rebound.  Musculoskeletal: Normal range of motion.        General: No tenderness.     Comments: Bilateral lower ext wrapped in una boots.  No toe swelling noted.  Minimal swelling in bilateral knees but no pitting edema  Skin:    General: Skin is warm and dry.     Capillary Refill: Capillary refill takes less than 2 seconds.     Findings: No rash.  Neurological:     General: No focal deficit present.     Mental Status: She is alert and oriented to person, place, and time. Mental status is at baseline.     Cranial Nerves: No cranial nerve deficit.  Psychiatric:        Mood and Affect: Mood normal.        Behavior: Behavior normal.        Thought Content: Thought content normal.      ED Treatments / Results  Labs (all labs ordered are listed, but only abnormal results are displayed) Labs Reviewed  CBC WITH DIFFERENTIAL/PLATELET - Abnormal; Notable for the following components:      Result Value   MCH 25.4 (*)    MCHC 29.7 (*)    RDW 15.8 (*)    Eosinophils Absolute 1.6 (*)    All other components within normal limits  COMPREHENSIVE METABOLIC PANEL - Abnormal; Notable for the following components:   Glucose, Bld 137 (*)    AST 44 (*)    All other components within normal limits  BRAIN NATRIURETIC PEPTIDE  TROPONIN I (HIGH SENSITIVITY)  TROPONIN I (HIGH SENSITIVITY)    EKG EKG Interpretation  Date/Time:  Friday August 01 2019 16:15:12 EST Ventricular Rate:  96 PR Interval:    QRS Duration: 96 QT Interval:  376 QTC Calculation: 476 R Axis:   -47 Text Interpretation: Sinus  rhythm Inferior infarct, old Probable anterior infarct, old No significant change since last tracing Confirmed by 09-01-2005 (Gwyneth Sprout) on 08/01/2019 4:20:02 PM   Radiology Dg Chest 2 View  Result Date: 08/01/2019 CLINICAL DATA:  Chest pain EXAM: CHEST - 2 VIEW COMPARISON:  10/17/2018 FINDINGS: The heart size and mediastinal contours are within normal limits. Underpenetration of the chest related to habitus without acute appearing airspace opacity. Disc degenerative disease of the  thoracic spine. IMPRESSION: Underpenetration of the chest related to habitus without acute appearing airspace opacity. Electronically Signed   By: Lauralyn PrimesAlex  Bibbey M.D.   On: 08/01/2019 16:52   Dg Foot Complete Right  Result Date: 07/31/2019 CLINICAL DATA:  Foot wound EXAM: RIGHT FOOT COMPLETE - 3+ VIEW COMPARISON:  07/01/2019 FINDINGS: No fracture or dislocation of the right foot. Joint spaces are preserved. Soft tissue thickening over the heel without obvious wound. No underlying evidence of bony erosion or sclerosis. Plantar and Achilles calcaneal spurs. IMPRESSION: 1. No fracture or dislocation of the right foot. 2. Soft tissue thickening over the heel without obvious wound. No underlying evidence of bony erosion or sclerosis to suggest osteomyelitis. MRI may be used to further assess bone marrow edema and osteomyelitis if suspected. Electronically Signed   By: Lauralyn PrimesAlex  Bibbey M.D.   On: 07/31/2019 16:56    Procedures Procedures (including critical care time)  Medications Ordered in ED Medications - No data to display   Initial Impression / Assessment and Plan / ED Course  I have reviewed the triage vital signs and the nursing notes.  Pertinent labs & imaging results that were available during my care of the patient were reviewed by me and considered in my medical decision making (see chart for details).        [age] year old female with multiple medical problems presenting today with atypical chest pain and  worsening shortness of breath.  Patient is in no acute distress on exam but does occasionally take a deeper breath.  She is able to speak in full sentences and oxygen saturation is between 97 and 100% on room air.  Patient breath sounds are globally decreased most likely related to body habitus but no frank wheezing.  Patient has some mild swelling around the knees but no evidence of pitting edema.  She has had no fever or Covid-like symptoms at this time.  Patient's EKG today shows no significant change from prior EKGs.  CBC without acute changes, CMP without acute findings and initial troponin is 5.  BNP is still pending.  Patient symptoms could be related to CHF versus asthma exacerbation related to weather changes versus atypical ACS.  Patient does not appear to be having MI at this time and her chest pain is very atypical.  Low suspicion at this time for dissection, pericarditis.  Patient has not had any recent immobilizations or hospitalizations and has no prior history of clot however patient is moderate risk.  8:16 PM Patient's BNP is within normal limits, delta troponins are within normal limits.  On reevaluation patient still appears comfortable and oxygen saturation is in the high 90s on room air.  She is intermittently tachycardic but max of 104.  At this time we will not pursue further imaging for PE as symptoms have been progressive over months.  Patient will be given a prednisone taper as feel this is most likely respiratory however did encourage patient to follow-up with cardiology for outpatient testing and given strict return precautions.  Final Clinical Impressions(s) / ED Diagnoses   Final diagnoses:  Atypical chest pain  DOE (dyspnea on exertion)    ED Discharge Orders         Ordered    predniSONE (DELTASONE) 20 MG tablet  Daily     08/01/19 2014           Gwyneth SproutPlunkett, Midge Momon, MD 08/01/19 2017

## 2019-08-01 NOTE — Telephone Encounter (Signed)
New Message  Pt c/o of Chest Pain: STAT if CP now or developed within 24 hours  1. Are you having CP right now? yes  2. Are you experiencing any other symptoms (ex. SOB, nausea, vomiting, sweating)? SOB, nausea, vomiting, diarrhea,  Increased heart rate  3. How long have you been experiencing CP? About 1 month  4. Is your CP continuous or coming and going? Coming and Going  5. Have you taken Nitroglycerin? No ?

## 2019-08-01 NOTE — ED Triage Notes (Signed)
C/o ft sided chest pain x 3 days including n/v/d.

## 2019-08-01 NOTE — Discharge Instructions (Signed)
All your heart labs today were within normal limits and your EKG looked okay.  Your x-ray was clear and there was no sign of extra fluid.  Your symptoms today may be related to asthma and weather changes.  However we cannot completely rule out that this is related to your heart.  That is why it is important for you to see the cardiologist.  If symptoms abruptly worsen over the weekend you start getting more persistent chest pain or shortness of breath gets much worse please return to the emergency room.  In the meantime lets try a prednisone taper to see if that improves your symptoms.  Use the albuterol as needed

## 2019-08-01 NOTE — Telephone Encounter (Signed)
Discussed with Dr. Radford Pax and she instructed for the patient to be seen in the ER. Called and made patient aware. Patient states that she does not want to go to [facility] but is willing to go to [facility].

## 2019-08-01 NOTE — Telephone Encounter (Signed)
Spoke with the patient. She states that she has had intermittent episodes of sharp left sided chest pain at rest that last 1-5 seconds. She also states that for the past few days she has had worsening DOE with walking 10 feet. Also complains of intermittent palpitations. Had an episode of N/V yesterday but no chest pain at that time. Denies LEE. BP 120/70 and HR 90-105. Patient has not had to use NTG. Taking imdur 30 mg QD and diltiazem 300 mg QD. She states that she has only been taking her lasix 40 mg PRN, last dose Tuesday. She states that she does have asthma and is not sure if her SOB is related. Denies CP at this time. Instructed patient to take her lasix and that I would discuss with Dr. Radford Pax.

## 2019-08-06 ENCOUNTER — Encounter (HOSPITAL_BASED_OUTPATIENT_CLINIC_OR_DEPARTMENT_OTHER): Payer: BC Managed Care – PPO

## 2019-08-06 ENCOUNTER — Other Ambulatory Visit: Payer: Self-pay

## 2019-08-06 ENCOUNTER — Telehealth (HOSPITAL_COMMUNITY): Payer: Self-pay | Admitting: *Deleted

## 2019-08-06 DIAGNOSIS — E11621 Type 2 diabetes mellitus with foot ulcer: Secondary | ICD-10-CM | POA: Diagnosis not present

## 2019-08-06 NOTE — Progress Notes (Signed)
Kraska, [name] ([id number]) Visit Report for 08/06/2019 SuperBill Details Patient Name: Date of Service: Dickenson, [name] 08/06/2019 Medical Record [id number] Patient Account Number: [id number] Date of Birth/Sex: Treating RN: [birth year] (58 y.o. [name] Primary Care Provider: [name] Other Clinician: Referring Provider: Treating Provider/Extender:Stone III, Luiz Blare, Archie Patten in Treatment: 2 Diagnosis Coding ICD-10 Codes Code Description E11.621 Type 2 diabetes mellitus with foot ulcer I89.0 Lymphedema, not elsewhere classified L97.412 Non-pressure chronic ulcer of right heel and midfoot with fat layer exposed L97.812 Non-pressure chronic ulcer of other part of right lower leg with fat layer exposed L98.492 Non-pressure chronic ulcer of skin of other sites with fat layer exposed I50.42 Chronic combined systolic (congestive) and diastolic (congestive) heart failure I10 Essential (primary) hypertension Facility Procedures CPT4 Description Modifier Quantity Code [id number] BILATERAL: Application of multi-layer venous compression system; leg 1 (below knee), including ankle and foot. Electronic Signature(s) Signed: 08/06/2019 3:17:43 PM By: Kela Millin Signed: 08/06/2019 4:52:33 PM By: Worthy Keeler PA-C Entered By: Kela Millin on 08/06/2019 14:09:52

## 2019-08-06 NOTE — Progress Notes (Signed)
[name], [name] ([id number]) Visit Report for 08/06/2019 Arrival Information Details Patient Name: Date of Service: [name], [name] 08/06/2019 1:15 PM Medical Record [id number] Patient Account Number: [id number] Date of Birth/Sex: Treating RN: March 15, 1961 (58 y.o. Harvest Dark Primary Care Jessi Pitstick: [name] Other Clinician: Referring Lenae Wherley: Treating Daelan Gatt/Extender:Stone III, Rochele Pages, Salvadore Dom in Treatment: 2 Visit Information History Since Last Visit Added or deleted any medications: No Patient Arrived: Dan Humphreys Any new allergies or adverse reactions: No Arrival Time: 13:14 Had a fall or experienced change in No Accompanied By: self activities of daily living that may affect Transfer Assistance: None risk of falls: Patient Identification Verified: Yes Signs or symptoms of abuse/neglect since last No Secondary Verification Process Yes visito Completed: Hospitalized since last visit: No Patient Requires Transmission- No Implantable device outside of the clinic excluding No Based Precautions: cellular tissue based products placed in the center Patient Has Alerts: Yes since last visit: Patient Alerts: Patient on Blood Has Dressing in Place as Prescribed: Yes Thinner Has Compression in Place as Prescribed: Yes Pain Present Now: No Electronic Signature(s) Signed: 08/06/2019 3:17:43 PM By: Cherylin Mylar Entered By: Cherylin Mylar on 08/06/2019 13:23:34 -------------------------------------------------------------------------------- Compression Therapy Details Patient Name: Date of Service: [name], [name] 08/06/2019 1:15 PM Medical Record [id number] Patient Account Number: [id number] Date of Birth/Sex: Treating RN: [birth year] (58 y.o. Harvest Dark Primary Care Ryin Ambrosius: [name] Other Clinician: Referring Nealie Mchatton: Treating Viviene Thurston/Extender:Stone III, Rochele Pages, Salvadore Dom in Treatment:  2 Compression Therapy Performed for Wound Wound #1 Right Calcaneus Assessment: Performed By: Clinician Cherylin Mylar, RN Compression Type: Four Layer Electronic Signature(s) Signed: 08/06/2019 3:17:43 PM By: Cherylin Mylar Entered By: Cherylin Mylar on 08/06/2019 14:06:46 -------------------------------------------------------------------------------- Compression Therapy Details Patient Name: Date of Service: [name], [name] 08/06/2019 1:15 PM Medical Record [id number] Patient Account Number: [id number] Date of Birth/Sex: Treating RN: 06/08/1961 (58 y.o. Harvest Dark Primary Care Aigner Horseman: [name] Other Clinician: Referring Khadeejah Castner: Treating Nayara Taplin/Extender:Stone III, Rochele Pages, Salvadore Dom in Treatment: 2 Compression Therapy Performed for Wound Wound #7 Right,Medial Malleolus Assessment: Performed By: Clinician Cherylin Mylar, RN Compression Type: Four Layer Electronic Signature(s) Signed: 08/06/2019 3:17:43 PM By: Cherylin Mylar Entered By: Cherylin Mylar on 08/06/2019 14:06:46 -------------------------------------------------------------------------------- Encounter Discharge Information Details Patient Name: Date of Service: [name], [name] 08/06/2019 1:15 PM Medical Record [id number] Patient Account Number: [id number] Date of Birth/Sex: Treating RN: 1961-08-25 (57 y.o. Harvest Dark Primary Care Faye Sanfilippo: [name] Other Clinician: Referring Waylyn Tenbrink: Treating Radin Raptis/Extender:Stone III, Rochele Pages, Salvadore Dom in Treatment: 2 Encounter Discharge Information Items Discharge Condition: Stable Ambulatory Status: Walker Discharge Destination: Home Transportation: Private Auto Accompanied By: husband Schedule Follow-up Appointment: Yes Clinical Summary of Care: Patient Declined Electronic Signature(s) Signed: 08/06/2019 3:17:43 PM By: Cherylin Mylar Entered By: Cherylin Mylar  on 08/06/2019 14:09:10 -------------------------------------------------------------------------------- Patient/Caregiver Education Details Patient Name: [name], [name] 11/25/2020andnbsp1:15 Date of Service: PM Medical Record [id number] Number: Patient Account Number: [id number] Treating RN: December 19, 1960 (58 y.o. Cherylin Mylar Date of Birth/Gender: F) Other Clinician: Primary Care Physician:Wendling, Nancee Liter Referring Physician: Physician/Extender: Philomena Course in Treatment: 2 Education Assessment Education Provided To: Patient Education Topics Provided Wound/Skin Impairment: Methods: Explain/Verbal Responses: State content correctly Electronic Signature(s) Signed: 08/06/2019 3:17:43 PM By: Cherylin Mylar Entered By: Cherylin Mylar on 08/06/2019 14:08:46 -------------------------------------------------------------------------------- Wound Assessment Details Patient Name: Date of Service: [name], [name] 08/06/2019 1:15 PM Medical Record [id number] Patient Account Number: [id number] Date of Birth/Sex: Treating RN: 1961/03/16 (58 y.o. Harvest Dark Primary Care Renly Roots: [name] Other  Clinician: Referring Sten Dematteo: Treating Rether Rison/Extender:Stone III, Luiz Blare, Archie Patten in Treatment: 2 Wound Status Wound Number: 1 Primary Diabetic Wound/Ulcer of the Lower Extremity Etiology: Wound Location: Right Calcaneus Wound Open Wounding Event: Blister Status: Date Acquired: 09/11/2016 Comorbid Cataracts, Lymphedema, Asthma, Congestive Weeks Of Treatment: 2 History: Heart Failure, Coronary Artery Disease, Clustered Wound: No Hypertension, Type II Diabetes, Rheumatoid Arthritis, Neuropathy Wound Measurements Length: (cm) 0.6 % Reduction Width: (cm) 1 % Reduction Depth: (cm) 0.1 Epitheliali Area: (cm) 0.471 Tunneling: Volume: (cm) 0.047 Underminin Wound Description Classification:  Grade 2 Wound Margin: Distinct, outline attached Exudate Amount: Small Exudate Type: Serous Exudate Color: amber Wound Bed Granulation Amount: Large (67-100%) Granulation Quality: Pink, Pale Necrotic Amount: Small (1-33%) Necrotic Quality: Adherent Slough Foul Odor After Cleansing: No Slough/Fibrino Yes Exposed Structure Fascia Exposed: No Fat Layer (Subcutaneous Tissue) Exposed: Yes Tendon Exposed: No Muscle Exposed: No Joint Exposed: No Bone Exposed: No in Area: 79% in Volume: 89.5% zation: Small (1-33%) No g: No Treatment Notes Wound #1 (Right Calcaneus) 1. Cleanse With Wound Cleanser Soap and water 2. Periwound Care Moisturizing lotion 3. Primary Dressing Applied Calcium Alginate Ag 4. Secondary Dressing Dry Gauze Heel Cup 6. Support Layer Applied 4 layer compression wrap Electronic Signature(s) Signed: 08/06/2019 3:17:43 PM By: Kela Millin Entered By: Kela Millin on 08/06/2019 14:05:38 -------------------------------------------------------------------------------- Wound Assessment Details Patient Name: Date of Service: [name], [name] 08/06/2019 1:15 PM Medical Record [id number] Patient Account Number: [id number] Date of Birth/Sex: Treating RN: May 29, 1961 (58 y.o. [name] Primary Care Jessica Checketts: [name] Other Clinician: Referring Syvanna Ciolino: Treating Leidy Massar/Extender:Stone III, Luiz Blare, Archie Patten in Treatment: 2 Wound Status Wound Number: 3 Primary Atypical Etiology: Wound Location: Abdomen - midline - Proximal Wound Open Wounding Event: Gradually Appeared Status: Date Acquired: 05/13/2019 Comorbid Cataracts, Lymphedema, Asthma, Congestive Weeks Of Treatment: 2 History: Heart Failure, Coronary Artery Disease, Clustered Wound: No Hypertension, Type II Diabetes, Rheumatoid Arthritis, Neuropathy Wound Measurements Length: (cm) 0.7 % Reduct Width: (cm) 0.9 % Reduct Depth: (cm) 0.1  Epitheli Area: (cm) 0.495 Tunneli Volume: (cm) 0.049 Undermi Wound Description Classification: Full Thickness Without Exposed Support Foul Od Structures Slough/ Wound Flat and Intact Margin: Exudate Medium Amount: Exudate Serosanguineous Type: Exudate red, brown Color: Wound Bed Granulation Amount: Small (1-33%) Granulation Quality: Pink Fascia E Necrotic Amount: Large (67-100%) Fat Laye Necrotic Quality: Adherent Slough Tendon E Muscle E Joint Ex Bone Exp or After Cleansing: No Fibrino Yes Exposed Structure xposed: No r (Subcutaneous Tissue) Exposed: Yes xposed: No xposed: No posed: No osed: No ion in Area: 1.6% ion in Volume: 2% alization: None ng: No ning: No Treatment Notes Wound #3 (Proximal Abdomen - midline) 1. Cleanse With Wound Cleanser 2. Periwound Care Skin Prep 3. Primary Dressing Applied Calcium Alginate Ag 4. Secondary Dressing Dry Gauze 5. Secured With Recruitment consultant) Signed: 08/06/2019 3:17:43 PM By: Kela Millin Entered By: Kela Millin on 08/06/2019 14:05:45 -------------------------------------------------------------------------------- Wound Assessment Details Patient Name: Date of Service: [name], [name] 08/06/2019 1:15 PM Medical Record [id number] Patient Account Number: [id number] Date of Birth/Sex: Treating RN: 1961-03-11 (58 y.o. [name] Primary Care Maleea Camilo: [name] Other Clinician: Referring Raheel Kunkle: Treating Nathian Stencil/Extender:Stone III, Luiz Blare, Archie Patten in Treatment: 2 Wound Status Wound Number: 4 Primary Atypical Etiology: Wound Location: Right Abdomen - Lower Quadrant - Medial Wound Open Status: Wounding Event: Gradually Appeared Comorbid Cataracts, Lymphedema, Asthma, Congestive Date Acquired: 05/13/2019 History: Heart Failure, Coronary Artery Disease, Weeks Of Treatment: 2 Hypertension, Type II Diabetes, Rheumatoid Clustered Wound:  No Arthritis, Neuropathy  Wound Measurements Length: (cm) 1.6 % Reduct Width: (cm) 1.4 % Reduct Depth: (cm) 0.1 Epitheli Area: (cm) 1.759 Tunneli Volume: (cm) 0.176 Undermi Wound Description Full Thickness Without Exposed Support Foul Od Classification: Structures Slough/ Wound Flat and Intact Margin: Exudate Medium Amount: Exudate Serosanguineous Type: Exudate red, brown Color: Wound Bed Granulation Amount: Medium (34-66%) Granulation Quality: Pink Fascia E Necrotic Amount: Medium (34-66%) Fat Laye Necrotic Quality: Adherent Slough Tendon E Muscle E Joint Ex Bone Exp or After Cleansing: No Fibrino Yes Exposed Structure xposed: No r (Subcutaneous Tissue) Exposed: Yes xposed: No xposed: No posed: No osed: No ion in Area: 6.7% ion in Volume: 6.4% alization: None ng: No ning: No Treatment Notes Wound #4 (Right, Medial Abdomen - Lower Quadrant) 1. Cleanse With Wound Cleanser 2. Periwound Care Skin Prep 3. Primary Dressing Applied Calcium Alginate Ag 4. Secondary Dressing Dry Gauze 5. Secured With Secretary/administratorTape Electronic Signature(s) Signed: 08/06/2019 3:17:43 PM By: Cherylin Mylarwiggins, Shannon Entered By: Cherylin Mylarwiggins, Shannon on 08/06/2019 14:05:51 -------------------------------------------------------------------------------- Wound Assessment Details Patient Name: Date of Service: [name], [name] 08/06/2019 1:15 PM Medical Record [id number] Patient Account Number: [id number] Date of Birth/Sex: Treating RN: May 20, 1961 (58 y.o. Harvest DarkF) Dwiggins, Shannon Primary Care Keiandra Sullenger: Arva ChafeWendling, Nicholas Other Clinician: Referring Nela Bascom: Treating Nou Chard/Extender:Stone III, Rochele PagesHoyt Wendling, Salvadore DomNicholas Weeks in Treatment: 2 Wound Status Wound Number: 5 Primary Atypical Etiology: Wound Location: Right Abdomen - Lower Quadrant - Lateral Wound Open Status: Wounding Event: Gradually Appeared Comorbid Cataracts, Lymphedema, Asthma, Congestive Date Acquired: 05/13/2019 History:  Heart Failure, Coronary Artery Disease, Weeks Of Treatment: 2 Hypertension, Type II Diabetes, Rheumatoid Clustered Wound: No Arthritis, Neuropathy Wound Measurements Length: (cm) 1 % Reduct Width: (cm) 0.9 % Reduct Depth: (cm) 0.1 Epitheli Area: (cm) 0.707 Tunneli Volume: (cm) 0.071 Undermi Wound Description Classification: Full Thickness Without Exposed Support Foul Od Structures Slough/ Wound Flat and Intact Margin: Exudate Medium Amount: Exudate Serosanguineous Type: Exudate red, brown Color: Wound Bed Granulation Amount: Medium (34-66%) Granulation Quality: Pink Fascia E Necrotic Amount: Medium (34-66%) Fat Laye Necrotic Quality: Adherent Slough Tendon E Muscle E Joint Ex Bone Exp or After Cleansing: No Fibrino Yes Exposed Structure xposed: No r (Subcutaneous Tissue) Exposed: Yes xposed: No xposed: No posed: No osed: No ion in Area: 24.9% ion in Volume: 24.5% alization: None ng: No ning: No Treatment Notes Wound #5 (Right, Lateral Abdomen - Lower Quadrant) 1. Cleanse With Wound Cleanser 2. Periwound Care Skin Prep 3. Primary Dressing Applied Calcium Alginate Ag 4. Secondary Dressing Dry Gauze 5. Secured With Secretary/administratorTape Electronic Signature(s) Signed: 08/06/2019 3:17:43 PM By: Cherylin Mylarwiggins, Shannon Entered By: Cherylin Mylarwiggins, Shannon on 08/06/2019 14:06:00 -------------------------------------------------------------------------------- Wound Assessment Details Patient Name: Date of Service: [name], [name] 08/06/2019 1:15 PM Medical Record [id number] Patient Account Number: [id number] Date of Birth/Sex: Treating RN: May 20, 1961 (58 y.o. Harvest DarkF) Dwiggins, Shannon Primary Care Emaleigh Guimond: Arva ChafeWendling, Nicholas Other Clinician: Referring Breannah Kratt: Treating Sylvana Bonk/Extender:Stone III, Rochele PagesHoyt Wendling, Salvadore DomNicholas Weeks in Treatment: 2 Wound Status Wound Number: 6 Primary Atypical Etiology: Wound Location: Abdomen - midline - Distal Wound Open Wounding Event:  Gradually Appeared Status: Date Acquired: 05/13/2019 Comorbid Cataracts, Lymphedema, Asthma, Congestive Weeks Of Treatment: 2 History: Heart Failure, Coronary Artery Disease, Clustered Wound: No Hypertension, Type II Diabetes, Rheumatoid Arthritis, Neuropathy Wound Measurements Length: (cm) 1.2 % Reduct Width: (cm) 0.3 % Reduct Depth: (cm) 0.1 Epitheli Area: (cm) 0.283 Tunneli Volume: (cm) 0.028 Undermi Wound Description Classification: Full Thickness Without Exposed Support Foul Od Structures Slough/ Wound Flat and Intact Margin: Exudate Small Amount: Exudate Serosanguineous Type: Exudate red, brown Color: Wound Bed Granulation  Amount: Large (67-100%) Granulation Quality: Pink Fascia Expo Necrotic Amount: Small (1-33%) Fat Layer ( Necrotic Quality: Adherent Slough Tendon Expo Muscle Expo Joint Expos Bone Expose or After Cleansing: No Fibrino Yes Exposed Structure sed: No Subcutaneous Tissue) Exposed: Yes sed: No sed: No ed: No d: No ion in Area: 44.6% ion in Volume: 45.1% alization: None ng: No ning: No Treatment Notes Wound #6 (Distal Abdomen - midline) 1. Cleanse With Wound Cleanser 2. Periwound Care Skin Prep 3. Primary Dressing Applied Calcium Alginate Ag 4. Secondary Dressing Dry Gauze 5. Secured With Secretary/administrator) Signed: 08/06/2019 3:17:43 PM By: Cherylin Mylar Entered By: Cherylin Mylar on 08/06/2019 14:06:08 -------------------------------------------------------------------------------- Wound Assessment Details Patient Name: Date of Service: [name], [name] 08/06/2019 1:15 PM Medical Record [id number] Patient Account Number: [id number] Date of Birth/Sex: Treating RN: Feb 07, 1961 (58 y.o. Harvest Dark Primary Care Dutchess [name]: [name] Other Clinician: Referring Ott Zimmerle: Treating Jahki Witham/Extender:Stone III, Rochele Pages, Salvadore Dom in Treatment: 2 Wound Status Wound Number: 7  Primary Lymphedema Etiology: Wound Location: Right Malleolus - Medial Wound Open Wounding Event: Gradually Appeared Status: Date Acquired: 07/31/2019 Comorbid Cataracts, Lymphedema, Asthma, Weeks Of Treatment: 0 History: Congestive Heart Failure, Coronary Artery Clustered Wound: No Disease, Hypertension, Type II Diabetes, Rheumatoid Arthritis, Neuropathy Wound Measurements Length: (cm) 1.7 % Reduct Width: (cm) 1 % Reduct Depth: (cm) 0.1 Epitheli Area: (cm) 1.335 Tunneli Volume: (cm) 0.134 Undermi Wound Description Classification: Full Thickness Without Exposed Support Foul Od Structures Slough/ Wound Distinct, outline attached Margin: Exudate Small Amount: Exudate Serous Type: Exudate amber Color: Wound Bed Granulation Amount: Large (67-100%) Granulation Quality: Pale Fascia E Necrotic Amount: None Present (0%) Fat Laye Tendon E Muscle E Joint Ex Bone Exp or After Cleansing: No Fibrino No Exposed Structure xposed: No r (Subcutaneous Tissue) Exposed: Yes xposed: No xposed: No posed: No osed: No ion in Area: 0% ion in Volume: 0% alization: None ng: No ning: No Treatment Notes Wound #7 (Right, Medial Malleolus) 1. Cleanse With Wound Cleanser Soap and water 2. Periwound Care Moisturizing lotion 3. Primary Dressing Applied Calcium Alginate Ag 4. Secondary Dressing Dry Gauze Heel Cup 6. Support Layer Applied 4 layer compression wrap Electronic Signature(s) Signed: 08/06/2019 3:17:43 PM By: Cherylin Mylar Entered By: Cherylin Mylar on 08/06/2019 14:06:19 -------------------------------------------------------------------------------- Vitals Details Patient Name: Date of Service: [name], Johnetta M. 08/06/2019 1:15 PM Medical Record [id number] Patient Account Number: [id number] Date of Birth/Sex: Treating RN: December 30, 1960 (58 y.o. Harvest Dark Primary Care Tobe Kervin: [name] Other Clinician: Referring Carletta Feasel:  Treating Jewelene Mairena/Extender:Stone III, Rochele Pages, Salvadore Dom in Treatment: 2 Vital Signs Time Taken: 13:20 Temperature (F): 98.4 Height (in): 62 Pulse (bpm): 108 Weight (lbs): 300 Respiratory Rate (breaths/min): 19 Body Mass Index (BMI): 54.9 Blood Pressure (mmHg): 148/75 Capillary Blood Glucose (mg/dl): 916 Reference Range: 80 - 120 mg / dl Notes patient reported last CBG of 270 Electronic Signature(s) Signed: 08/06/2019 3:17:43 PM By: Cherylin Mylar Entered By: Cherylin Mylar on 08/06/2019 13:24:38

## 2019-08-06 NOTE — Telephone Encounter (Signed)

## 2019-08-11 ENCOUNTER — Other Ambulatory Visit (HOSPITAL_COMMUNITY): Payer: Self-pay | Admitting: Physician Assistant

## 2019-08-11 ENCOUNTER — Other Ambulatory Visit: Payer: Self-pay

## 2019-08-11 ENCOUNTER — Ambulatory Visit (HOSPITAL_COMMUNITY)
Admission: RE | Admit: 2019-08-11 | Discharge: 2019-08-11 | Disposition: A | Discharge status: Home / Self Care | Payer: BC Managed Care – PPO | Source: Ambulatory Visit | Attending: Family | Admitting: Family

## 2019-08-11 DIAGNOSIS — R609 Edema, unspecified: Secondary | ICD-10-CM

## 2019-08-14 ENCOUNTER — Encounter (HOSPITAL_BASED_OUTPATIENT_CLINIC_OR_DEPARTMENT_OTHER): Payer: BC Managed Care – PPO | Attending: Internal Medicine | Admitting: Internal Medicine

## 2019-08-14 ENCOUNTER — Other Ambulatory Visit: Payer: Self-pay

## 2019-08-14 DIAGNOSIS — L98492 Non-pressure chronic ulcer of skin of other sites with fat layer exposed: Secondary | ICD-10-CM | POA: Diagnosis not present

## 2019-08-14 DIAGNOSIS — I110 Hypertensive heart disease with heart failure: Secondary | ICD-10-CM | POA: Diagnosis not present

## 2019-08-14 DIAGNOSIS — Z6841 Body Mass Index (BMI) 40.0 and over, adult: Secondary | ICD-10-CM | POA: Diagnosis not present

## 2019-08-14 DIAGNOSIS — L97412 Non-pressure chronic ulcer of right heel and midfoot with fat layer exposed: Secondary | ICD-10-CM | POA: Insufficient documentation

## 2019-08-14 DIAGNOSIS — E11621 Type 2 diabetes mellitus with foot ulcer: Secondary | ICD-10-CM | POA: Insufficient documentation

## 2019-08-14 DIAGNOSIS — L97812 Non-pressure chronic ulcer of other part of right lower leg with fat layer exposed: Secondary | ICD-10-CM | POA: Insufficient documentation

## 2019-08-14 DIAGNOSIS — M351 Other overlap syndromes: Secondary | ICD-10-CM | POA: Insufficient documentation

## 2019-08-14 DIAGNOSIS — I5042 Chronic combined systolic (congestive) and diastolic (congestive) heart failure: Secondary | ICD-10-CM | POA: Diagnosis not present

## 2019-08-14 DIAGNOSIS — I890 Lymphedema, not elsewhere classified: Secondary | ICD-10-CM | POA: Diagnosis not present

## 2019-08-14 DIAGNOSIS — E669 Obesity, unspecified: Secondary | ICD-10-CM | POA: Insufficient documentation

## 2019-08-15 ENCOUNTER — Ambulatory Visit (INDEPENDENT_AMBULATORY_CARE_PROVIDER_SITE_OTHER): Payer: BC Managed Care – PPO | Admitting: Family Medicine

## 2019-08-15 ENCOUNTER — Other Ambulatory Visit: Payer: Self-pay

## 2019-08-15 ENCOUNTER — Encounter: Payer: Self-pay | Admitting: Family Medicine

## 2019-08-15 VITALS — BP 140/68 | HR 107 | Temp 97.5°F | Resp 22 | Wt 324.2 lb

## 2019-08-15 DIAGNOSIS — M79651 Pain in right thigh: Secondary | ICD-10-CM

## 2019-08-15 DIAGNOSIS — R0789 Other chest pain: Secondary | ICD-10-CM

## 2019-08-15 DIAGNOSIS — J45909 Unspecified asthma, uncomplicated: Secondary | ICD-10-CM

## 2019-08-15 DIAGNOSIS — E1142 Type 2 diabetes mellitus with diabetic polyneuropathy: Secondary | ICD-10-CM | POA: Diagnosis not present

## 2019-08-15 MED ORDER — UMECLIDINIUM BROMIDE 62.5 MCG/INH IN AEPB: 1.0000 | INHALATION_SPRAY | Freq: Every day | RESPIRATORY_TRACT | 2 refills | Status: DC

## 2019-08-15 MED ORDER — PREDNISONE 20 MG PO TABS: 40.0000 mg | ORAL_TABLET | Freq: Every day | ORAL | 0 refills | Status: AC

## 2019-08-15 NOTE — Patient Instructions (Signed)
Keep the diet clean and stay active.  Try to stretch your lower extremities.   I think the chest wall pain is related to your labored breathing from your asthma, but don't rule out your heart given your history.   Please call Dr. Radford Pax for a perhaps earlier appointment.   Let us know if you need anything.

## 2019-08-15 NOTE — Progress Notes (Signed)
[name], [name] ([id number]) Visit Report for 08/14/2019 Debridement Details Patient Name: Date of Service: [name], [name] 08/14/2019 10:00 AM Medical Record [id number] Patient Account Number: [id number] Date of Birth/Sex: March 09, 1961 (58 y.o. F) Treating RN: Primary Care Provider: [name] Other Clinician: Referring Provider: Treating Provider/Extender:Robson, Arlis Porta, Archie Patten in Treatment: 3 Debridement Performed for Wound #4 Right,Medial Abdomen - Lower Quadrant Assessment: Performed By: Physician Ricard Dillon., MD Debridement Type: Debridement Level of Consciousness (Pre- Awake and Alert procedure): Pre-procedure Yes - 11:12 Verification/Time Out Taken: Start Time: 11:13 Pain Control: Lidocaine 4% Topical Solution Total Area Debrided (L x W): 1.2 (cm) x 1.3 (cm) = 1.56 (cm) Tissue and other material Viable, Non-Viable, Slough, Skin: Dermis , Fibrin/Exudate, Slough debrided: Level: Skin/Dermis Debridement Description: Selective/Open Wound Instrument: Curette Bleeding: Minimum Hemostasis Achieved: Pressure End Time: 11:17 Procedural Pain: 0 Post Procedural Pain: 0 Response to Treatment: Procedure was tolerated well Level of Consciousness Awake and Alert (Post-procedure): Post Debridement Measurements of Total Wound Length: (cm) 1.2 Width: (cm) 1.3 Depth: (cm) 0.1 Volume: (cm) 0.123 Character of Wound/Ulcer Post Improved Debridement: Post Procedure Diagnosis Same as Pre-procedure Electronic Signature(s) Signed: 08/15/2019 7:57:15 AM By: Linton Ham MD Entered By: Linton Ham on 08/14/2019 11:44:47 -------------------------------------------------------------------------------- Debridement Details Patient Name: Date of Service: [name], [name] 08/14/2019 10:00 AM Medical Record [id number] Patient Account Number: [id number] Date of Birth/Sex: 02/12/1961 (58 y.o. F) Treating RN: Primary Care Provider: [name] Other Clinician: Referring Provider: Treating Provider/Extender:Robson, Arlis Porta, Archie Patten in Treatment: 3 Debridement Performed for Wound #6 Distal Abdomen - midline Assessment: Performed By: Physician Ricard Dillon., MD Debridement Type: Debridement Level of Consciousness (Pre- Awake and Alert procedure): Pre-procedure Yes - 11:12 Verification/Time Out Taken: Start Time: 11:13 Pain Control: Lidocaine 4% Topical Solution Total Area Debrided (L x W): 0.4 (cm) x 0.3 (cm) = 0.12 (cm) Tissue and other material Viable, Non-Viable, Slough, Skin: Dermis , Fibrin/Exudate, Slough debrided: Level: Skin/Dermis Debridement Description: Selective/Open Wound Instrument: Curette Bleeding: Minimum Hemostasis Achieved: Pressure End Time: 11:17 Procedural Pain: 0 Post Procedural Pain: 0 Response to Treatment: Procedure was tolerated well Level of Consciousness Awake and Alert (Post-procedure): Post Debridement Measurements of Total Wound Length: (cm) 0.4 Width: (cm) 0.3 Depth: (cm) 0.1 Volume: (cm) 0.009 Character of Wound/Ulcer Post Improved Debridement: Post Procedure Diagnosis Same as Pre-procedure Electronic Signature(s) Signed: 08/15/2019 7:57:15 AM By: Linton Ham MD Entered By: Linton Ham on 08/14/2019 11:45:02 -------------------------------------------------------------------------------- HPI Details Patient Name: Date of Service: [name], [name] 08/14/2019 10:00 AM Medical Record [id number] Patient Account Number: [id number] Date of Birth/Sex: Treating RN: [birth year] (58 y.o. F) Primary Care Provider: [name] Other Clinician: Referring Provider: Treating Provider/Extender:Robson, Arlis Porta, Archie Patten in Treatment: 3 History of Present Illness HPI Description: 07/23/2019 on evaluation today patient presents with a myriad of wounds noted at multiple locations over her right heel, right lower  extremity, and abdominal region. She also has bilateral lower extremity lymphedema which is quite significant as well. Incidentally she also has congestive heart failure and hypertension. She also is obese. With that being said I think all this is contributing as well to her lower extremity edema which is very much uncontrolled. It seems like its been at least several months since she is worn any compression according to what she tells me. With that being said I am not sure exactly when that would have been. She does have fibrotic changes in the lower extremity secondary to lymphedema worse on the left than the right. She did show  me pictures of wound she had on the anterior portion of her shin which was quite significant fortunately that has healed. These issues have been intermittent at this time. Again I think that with appropriate compression therapy she may actually be doing better than what we are seeing at this point but again I am not really sure that she is ever been extremely compliant with that. Fortunately there is no signs of active infection at this time. No fever chills noted. As far as the abdominal ulcer she initially had one wound that she thinks may have been a bug bite. Subsequently she was put a dressing on this and she states that the tape pulled skin off on other locations causing other wounds that have not healed that is been about 3 months. The wounds on her lower extremities have been intermittent over 3 years. This includes the heel. 11/19; this is a patient that I have not seen previously. She was admitted to our clinic last week with multiple wounds including several superficial circular areas on her abdomen predominantly right upper quadrant. Also 1 in her umbilicus. She has an area on her right lateral malleolus which was new today. She has bilateral lower extremity edema with very significant stasis dermatitis on the left anterior tibial area. She has tightly adherent  skin in her lower extremities probably secondary to cutaneous fibrosis in the area. We put her in compression last week. She comes in with the dressings reasonably saturated. 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 She has multiple areas on her arms from scratching. 12/3; the patient I saw for the first time 2 weeks ago. She has 3 small circular areas on her abdomen 2 in the right upper quadrant one on her umbilicus. We have been using silver alginate to this area. She also had an area on her right lateral malleolus and right heel and right medial malleolus. We have been using silver alginate here under compression. She does not have an open area on the left leg today. We are going to order her stockings for the left leg. She clearly has chronic lymphedema in these areas. X-ray of the foot from 10/20 did not show any fracture or dislocation. The heel wound was noted there was no evidence of osteomyelitis She complains of generalized pruritus. 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 She has longstanding lymphedema. Although she does not have known liver disease I looked at his CT scan of the abdomen from February of this year that showed hepatic steatosis Electronic Signature(s) Signed: 08/15/2019 7:57:15 AM By: Linton Ham MD Entered By: Linton Ham on 08/14/2019 12:01:48 -------------------------------------------------------------------------------- Physical Exam Details Patient Name: Date of Service: [name], [name] 08/14/2019 10:00 AM Medical Record [id number] Patient Account Number: [id number] Date of Birth/Sex: Treating RN: 12/19/1960 (58 y.o. F) Primary Care Provider: [name] Other Clinician: Referring Provider: Treating  Provider/Extender:Robson, Arlis Porta, Archie Patten in Treatment: 3 Constitutional Patient is hypertensive.. Pulse regular and within target range for patient.Marland Kitchen Respirations regular, non-labored and within target range.Marland Kitchen Appears in no distress. Cardiovascular Pedal pulses are palpable on the right. Edema is under better control bilaterally. Gastrointestinal (GI) Abdomen is very distended however there was no shifting dullness she appears to have striations on her abdomen. Liver or spleen were not palpable.. Integumentary (Hair, Skin) Changes of chronic  venous inflammation with secondary lymphedema left greater than right on her abdomen she has the 3 small circular areas. 2 of these require debridement with a #3 curette to remove necrotic surface debris hemostasis with direct pressure. Notes Wound exam Majority of the lower extremity wounds of the right heel and the right lateral malleolus. No debridement was done here. The weeping lymphedema that she had from last time looks better. She has no areas on the left. Electronic Signature(s) Signed: 08/15/2019 7:57:15 AM By: Linton Ham MD Entered By: Linton Ham on 08/14/2019 11:48:50 -------------------------------------------------------------------------------- Physician Orders Details Patient Name: Date of Service: [name], [name] 08/14/2019 10:00 AM Medical Record [id number] Patient Account Number: [id number] Date of Birth/Sex: Treating RN: [birth year] (58 y.o. 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 Skin Barriers/Peri-Wound Care TCA Cream or Ointment - mixed with lotion liberally both legs. Wound Cleansing Wound #3 Proximal Abdomen - midline May shower and wash wound with soap and water. Wound #4 Right,Medial Abdomen - Lower Quadrant May shower and wash wound with soap and water. Wound #6 Distal Abdomen - midline May shower and wash wound with soap and water. Wound #1 Right Calcaneus May shower with protection. Wound #2R Right,Medial Lower Leg May shower with protection. Wound #7 Right,Medial Malleolus May shower with protection. Primary Wound Dressing Wound #1 Right Calcaneus Calcium Alginate with Silver Wound #2R Right,Medial Lower Leg Calcium Alginate with Silver Wound #7 Right,Medial Malleolus Calcium Alginate with Silver Wound #3 Proximal Abdomen - midline Hydrofera Blue Wound #4 Right,Medial Abdomen - Lower Quadrant Hydrofera Blue Wound #6 Distal Abdomen - midline Hydrofera Blue Secondary Dressing Wound #1 Right Calcaneus Dry Gauze Heel Cup Wound #3 Proximal Abdomen - midline Dry Gauze - or ABD pad Wound #4 Right,Medial Abdomen - Lower Quadrant Dry Gauze - or ABD pad Wound #6 Distal Abdomen - midline Dry Gauze - or ABD pad Edema Control 4 layer compression - Bilateral - unna boot upper portion of lower leg. Avoid standing for long periods of time Elevate legs to the level of the heart or above for 30 minutes daily and/or when sitting, a frequency of: - whenever sitting Exercise regularly Other: - Patient to call elastic therapy to order 20-30 mmHg compression stockings for left leg. Once arrives bring in next week to wound  center. Off-Loading Open toe surgical shoe to: - to both feet. Electronic Signature(s) Signed: 08/14/2019 6:37:14 PM By: Deon Pilling Signed: 08/15/2019 7:57:15 AM By: Linton Ham MD Entered By: Deon Pilling on 08/14/2019 11:16:01 -------------------------------------------------------------------------------- Problem List Details Patient Name: Date of Service: [name], [name] 08/14/2019 10:00 AM Medical Record [id number] Patient Account Number: [id number] Date of Birth/Sex: Treating RN: [birth year] (58 y.o. 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 07/23/2019 No Yes with fat layer exposed L97.812 Non-pressure chronic ulcer of other part of right lower 07/23/2019 No Yes leg with fat layer exposed L98.492 Non-pressure chronic ulcer of skin of other sites with 07/23/2019 No Yes fat layer exposed I50.42 Chronic combined systolic (congestive) and diastolic 74/82/7078 No Yes (congestive) heart failure I10 Essential (primary) hypertension 07/23/2019 No Yes Inactive Problems Resolved Problems Electronic Signature(s) Signed: 08/15/2019 7:57:15 AM By: Linton Ham MD Entered By: Linton Ham on 08/14/2019 11:44:23 -------------------------------------------------------------------------------- Progress Note Details Patient Name: Date of Service: [name], [name] 08/14/2019 10:00 AM Medical Record [id number] Patient Account Number: [id number] Date of Birth/Sex: Treating RN: 06-27-1961 (58 y.o. F) Primary Care Provider: [name] Other  Clinician: Referring Provider: Treating Provider/Extender:Robson, Arlis Porta, Archie Patten in Treatment: 3 Subjective History of Present Illness (HPI) 07/23/2019 on evaluation today patient presents with a myriad of wounds noted at multiple locations over her right heel, right lower extremity, and abdominal region. She also has bilateral lower extremity lymphedema which is quite significant as well. Incidentally she also has congestive heart failure and hypertension. She also is obese. With that being said I think all this is contributing as well to her lower extremity edema which is very much uncontrolled. It seems like its been at least several months since she is worn any compression according to what she tells me. With that being said I am not sure exactly when that would have been. She does have fibrotic changes in the lower extremity secondary to lymphedema worse on the left than the right. She did show me pictures of wound she had on the anterior portion of her shin which was quite significant fortunately that has healed. These issues have been intermittent at this time. Again I think that with appropriate compression therapy she may actually be doing better than what we are seeing at this point but again I am not really sure that she is ever been extremely compliant with that. Fortunately there is no signs of active infection at this time. No fever chills noted. As far as the abdominal ulcer she initially had one wound that she thinks may have been a bug bite. Subsequently she was put a dressing on this and she states that the tape pulled skin off on other locations causing other wounds that have not healed that is been about 3 months. The wounds on her lower extremities have been intermittent over 3 years. This includes the heel. 11/19; this is a patient that I have not seen previously. She was admitted to our clinic last week with multiple wounds including several superficial  circular areas on her abdomen predominantly right upper quadrant. Also 1 in her umbilicus. She has an area on her right lateral malleolus which was new today. She has bilateral lower extremity edema with very significant stasis dermatitis on the left anterior tibial area. She has tightly adherent skin in her lower extremities probably secondary to cutaneous fibrosis in the area. We put her in compression last week. She comes in with the dressings reasonably saturated. 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 She has multiple areas on her arms from scratching. 12/3; the patient I saw for the first time 2 weeks ago. She has 3 small circular areas on  her abdomen 2 in the right upper quadrant one on her umbilicus. We have been using silver alginate to this area. She also had an area on her right lateral malleolus and right heel and right medial malleolus. We have been using silver alginate here under compression. She does not have an open area on the left leg today. We are going to order her stockings for the left leg. She clearly has chronic lymphedema in these areas. X-ray of the foot from 10/20 did not show any fracture or dislocation. The heel wound was noted there was no evidence of osteomyelitis She complains of generalized pruritus. 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 She has longstanding lymphedema. Although she does not have known liver disease I looked at his CT scan of the abdomen from February of this year that showed hepatic steatosis Objective Constitutional Patient is hypertensive.. Pulse regular and within target range for patient.Marland Kitchen Respirations regular, non-labored and within target range.Marland Kitchen Appears in no distress. Vitals Time Taken: 10:15 AM,  Height: 62 in, Weight: 300 lbs, BMI: 54.9, Temperature: 98.2 F, Pulse: 107 bpm, Respiratory Rate: 22 breaths/min, Blood Pressure: 186/80 mmHg. Cardiovascular Pedal pulses are palpable on the right. Edema is under better control bilaterally. Gastrointestinal (GI) Abdomen is very distended however there was no shifting dullness she appears to have striations on her abdomen. Liver or spleen were not palpable.. General Notes: Wound exam ooMajority of the lower extremity wounds of the right heel and the right lateral malleolus. No debridement was done here. The weeping lymphedema that she had from last time looks better. She has no areas on the left. oo Integumentary (Hair, Skin) Changes of chronic venous inflammation with secondary lymphedema left greater than right on her abdomen she has the 3 small circular areas. 2 of these require debridement with a #3 curette to remove necrotic surface debris hemostasis with direct pressure. Wound #1 status is Open. Original cause of wound was Blister. The wound is located on the Right Calcaneus. The wound measures 0.7cm length x 2cm width x 0.1cm depth; 1.1cm^2 area and 0.11cm^3 volume. There is Fat Layer (Subcutaneous Tissue) Exposed exposed. There is no tunneling or undermining noted. There is a medium amount of serous drainage noted. The wound margin is distinct with the outline attached to the wound base. There is large (67-100%) pink granulation within the wound bed. There is no necrotic tissue within the wound bed. Wound #2R status is Open. Original cause of wound was Blister. The wound is located on the Right,Medial Lower Leg. The wound measures 0.4cm length x 0.3cm width x 0.1cm depth; 0.094cm^2 area and 0.009cm^3 volume. There is Fat Layer (Subcutaneous Tissue) Exposed exposed. There is no tunneling or undermining noted. There is a small amount of serosanguineous drainage noted. The wound margin is flat and intact. There is large (67-100%)  red granulation within the wound bed. There is no necrotic tissue within the wound bed. Wound #3 status is Open. Original cause of wound was Gradually Appeared. The wound is located on the Proximal Abdomen - midline. The wound measures 0.2cm length x 0.2cm width x 0.1cm depth; 0.031cm^2 area and 0.003cm^3 volume. There is Fat Layer (Subcutaneous Tissue) Exposed exposed. There is no tunneling or undermining noted. There is a small amount of serosanguineous drainage noted. The wound margin is flat and intact. There is large (67-100%) pink granulation within the wound bed. There is no necrotic tissue within the wound bed. Wound #4 status is Open. Original cause  of wound was Gradually Appeared. The wound is located on the Right,Medial Abdomen - Lower Quadrant. The wound measures 1.2cm length x 1.3cm width x 0.1cm depth; 1.225cm^2 area and 0.123cm^3 volume. There is Fat Layer (Subcutaneous Tissue) Exposed exposed. There is no tunneling or undermining noted. There is a medium amount of serosanguineous drainage noted. The wound margin is flat and intact. There is large (67-100%) pink granulation within the wound bed. There is a small (1-33%) amount of necrotic tissue within the wound bed including Eschar. Wound #5 status is Healed - Epithelialized. Original cause of wound was Gradually Appeared. The wound is located on the Right,Lateral Abdomen - Lower Quadrant. The wound measures 0cm length x 0cm width x 0cm depth; 0cm^2 area and 0cm^3 volume. There is no tunneling or undermining noted. There is a none present amount of drainage noted. The wound margin is flat and intact. There is no granulation within the wound bed. There is no necrotic tissue within the wound bed. Wound #6 status is Open. Original cause of wound was Gradually Appeared. The wound is located on the Distal Abdomen - midline. The wound measures 0.4cm length x 0.3cm width x 0.1cm depth; 0.094cm^2 area and 0.009cm^3 volume. There is Fat  Layer (Subcutaneous Tissue) Exposed exposed. There is no tunneling or undermining noted. There is a small amount of serosanguineous drainage noted. The wound margin is flat and intact. There is large (67-100%) pink granulation within the wound bed. There is no necrotic tissue within the wound bed. Wound #7 status is Open. Original cause of wound was Gradually Appeared. The wound is located on the Right,Medial Malleolus. The wound measures 1.5cm length x 2cm width x 0.1cm depth; 2.356cm^2 area and 0.236cm^3 volume. There is Fat Layer (Subcutaneous Tissue) Exposed exposed. There is no tunneling or undermining noted. There is a small amount of serous drainage noted. The wound margin is distinct with the outline attached to the wound base. There is large (67-100%) pink granulation within the wound bed. There is no necrotic tissue within the wound bed. Assessment Active Problems ICD-10 Type 2 diabetes mellitus with foot ulcer Lymphedema, not elsewhere classified Non-pressure chronic ulcer of right heel and midfoot with fat layer exposed Non-pressure chronic ulcer of other part of right lower leg with fat layer exposed Non-pressure chronic ulcer of skin of other sites with fat layer exposed Chronic combined systolic (congestive) and diastolic (congestive) heart failure Essential (primary) hypertension Procedures Wound #4 Pre-procedure diagnosis of Wound #4 is an Atypical located on the Right,Medial Abdomen - Lower Quadrant . There was a Selective/Open Wound Skin/Dermis Debridement with a total area of 1.56 sq cm performed by Ricard Dillon., MD. With the following instrument(s): Curette to remove Viable and Non-Viable tissue/material. Material removed includes Slough, Skin: Dermis, and Fibrin/Exudate after achieving pain control using Lidocaine 4% Topical Solution. A time out was conducted at 11:12, prior to the start of the procedure. A Minimum amount of bleeding was controlled with  Pressure. The procedure was tolerated well with a pain level of 0 throughout and a pain level of 0 following the procedure. Post Debridement Measurements: 1.2cm length x 1.3cm width x 0.1cm depth; 0.123cm^3 volume. Character of Wound/Ulcer Post Debridement is improved. Post procedure Diagnosis Wound #4: Same as Pre-Procedure Wound #6 Pre-procedure diagnosis of Wound #6 is an Atypical located on the Distal Abdomen - midline . There was a Selective/Open Wound Skin/Dermis Debridement with a total area of 0.12 sq cm performed by Ricard Dillon., MD. With the following instrument(s):  Curette to remove Viable and Non-Viable tissue/material. Material removed includes Slough, Skin: Dermis, and Fibrin/Exudate after achieving pain control using Lidocaine 4% Topical Solution. A time out was conducted at 11:12, prior to the start of the procedure. A Minimum amount of bleeding was controlled with Pressure. The procedure was tolerated well with a pain level of 0 throughout and a pain level of 0 following the procedure. Post Debridement Measurements: 0.4cm length x 0.3cm width x 0.1cm depth; 0.009cm^3 volume. Character of Wound/Ulcer Post Debridement is improved. Post procedure Diagnosis Wound #6: Same as Pre-Procedure Wound #1 Pre-procedure diagnosis of Wound #1 is a Diabetic Wound/Ulcer of the Lower Extremity located on the Right Calcaneus . There was a Three Layer Compression Therapy Procedure with a pre-treatment ABI of 0.9 by Baruch Gouty, RN. Post procedure Diagnosis Wound #1: Same as Pre-Procedure Wound #2R Pre-procedure diagnosis of Wound #2R is a Lymphedema located on the Right,Medial Lower Leg . There was a Three Layer Compression Therapy Procedure with a pre-treatment ABI of 0.9 by Baruch Gouty, RN. Post procedure Diagnosis Wound #2R: Same as Pre-Procedure Wound #7 Pre-procedure diagnosis of Wound #7 is a Lymphedema located on the Right,Medial Malleolus . There was a Three Layer  Compression Therapy Procedure with a pre-treatment ABI of 0.9 by Baruch Gouty, RN. Post procedure Diagnosis Wound #7: Same as Pre-Procedure There was a Three Layer Compression Therapy Procedure by Baruch Gouty, RN. Post procedure Diagnosis Wound #: Same as Pre-Procedure Plan Follow-up Appointments: Return Appointment in 1 week. Skin Barriers/Peri-Wound Care: TCA Cream or Ointment - mixed with lotion liberally both legs. Wound Cleansing: Wound #3 Proximal Abdomen - midline: May shower and wash wound with soap and water. Wound #4 Right,Medial Abdomen - Lower Quadrant: May shower and wash wound with soap and water. Wound #6 Distal Abdomen - midline: May shower and wash wound with soap and water. Wound #1 Right Calcaneus: May shower with protection. Wound #2R Right,Medial Lower Leg: May shower with protection. Wound #7 Right,Medial Malleolus: May shower with protection. Primary Wound Dressing: Wound #1 Right Calcaneus: Calcium Alginate with Silver Wound #2R Right,Medial Lower Leg: Calcium Alginate with Silver Wound #7 Right,Medial Malleolus: Calcium Alginate with Silver Wound #3 Proximal Abdomen - midline: Hydrofera Blue Wound #4 Right,Medial Abdomen - Lower Quadrant: Hydrofera Blue Wound #6 Distal Abdomen - midline: Hydrofera Blue Secondary Dressing: Wound #1 Right Calcaneus: Dry Gauze Heel Cup Wound #3 Proximal Abdomen - midline: Dry Gauze - or ABD pad Wound #4 Right,Medial Abdomen - Lower Quadrant: Dry Gauze - or ABD pad Wound #6 Distal Abdomen - midline: Dry Gauze - or ABD pad Edema Control: 4 layer compression - Bilateral - unna boot upper portion of lower leg. Avoid standing for long periods of time Elevate legs to the level of the heart or above for 30 minutes daily and/or when sitting, a frequency of: - whenever sitting Exercise regularly Other: - Patient to call elastic therapy to order 20-30 mmHg compression stockings for left leg. Once arrives bring  in next week to wound center. Off-Loading: Open toe surgical shoe to: - to both feet. 1. I am not sure why this woman has pruritus although I wonder if there is more to her liver and just hepatic steatosis. 2. We continued with silver alginate to the right leg under compression. Wrap the left leg to control her lymphedema and we gave her measurements for stockings that I hope to transition her left leg into next time 3. Her abdomen wounds are probably excoriations from scratching. The exact  cause of her generalized pruritus is not clear Electronic Signature(s) Signed: 08/14/2019 12:02:11 PM By: Linton Ham MD Entered By: Linton Ham on 08/14/2019 12:02:11 -------------------------------------------------------------------------------- SuperBill Details Patient Name: Date of Service: [name], [name] 08/14/2019 Medical Record [id number] Patient Account Number: [id number] Date of Birth/Sex: Treating RN: Nov 21, 1960 (58 y.o. 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 L97.812 Non-pressure chronic ulcer of other part of right lower leg with fat layer exposed L98.492 Non-pressure chronic ulcer of skin of other sites with fat layer exposed I50.42 Chronic combined systolic (congestive) and diastolic (congestive) heart failure I10 Essential (primary) hypertension Facility Procedures CPT4: Code [id number] I Description: 483 - DEBRIDE WOUND 1ST 20 SQ CM OR < CD-10 Diagnosis Description L98.492 Non-pressure chronic ulcer of skin of other sites with fat layer ex Modifier Quantity: 1 posed CPT4: 4758307460 (b Description: 029  BILATERAL: Application of multi-layer venous compression system; leg elow knee), including ankle and foot. Modifier Quantity: 59 1 Physician Procedures CPT4 Code Description: [id number] - WC PHYS DEBR WO ANESTH 20 SQ CM ICD-10 Diagnosis Description L98.492 Non-pressure chronic ulcer of skin of other sites with fat la Modifier: yer exposed Quantity: 1 Electronic Signature(s) Signed: 08/15/2019 7:57:15 AM By: Linton Ham MD Entered By: Linton Ham on 08/14/2019 11:51:11

## 2019-08-16 ENCOUNTER — Encounter: Payer: Self-pay | Admitting: Family Medicine

## 2019-08-16 ENCOUNTER — Other Ambulatory Visit: Payer: Self-pay | Admitting: Family Medicine

## 2019-08-16 DIAGNOSIS — Z789 Other specified health status: Secondary | ICD-10-CM

## 2019-08-16 LAB — HEMOGLOBIN A1C
Hgb A1c MFr Bld: 9.1 % of total Hgb — ABNORMAL HIGH (ref <5.7)
Mean Plasma Glucose: 214 (calc)
eAG (mmol/L): 11.9 (calc)

## 2019-08-16 MED ORDER — LIRAGLUTIDE 18 MG/3ML ~~LOC~~ SOPN: 1.8000 mg | PEN_INJECTOR | Freq: Every day | SUBCUTANEOUS | 11 refills | Status: DC

## 2019-08-16 NOTE — Progress Notes (Signed)
CC: Wheezing  Subjective: Patient is a 58 y.o. female here for f/u.  Pt has had DOE for past 1-2 weeks. She was told by cards to go to ER and was not found to have ACS. Has not f/u'd w cardiology. Notes she is wheezing. She did have significant coronary blockage in past with very similar s/s's. No chest pain, jaw pain, arm pain. She has been coughing. Diet has not been the best due to [place] last week. Swelling is unchanged. She is compliant with her inhalers. She has associated abd pain without bowel changes, N, V, bleeding.    R thigh pain over same amount of time. No swelling or skin changes over the area. It does not hurt to touch. No inj or change in activity. Denies calf pain.   ROS: Heart: Denies chest pain  Lungs: +wheezing  Past Medical History:  Diagnosis Date  . Asthma    "on daily RX and rescue inhaler" (04/18/2018)  . Chronic diastolic CHF (congestive heart failure) ([place]) 09/2015  . Chronic lower back pain   . Chronic neck pain   . Chronic pain syndrome    Fentanyl Patch  . Colon polyps   . Coronary artery disease    cath with normal LM, 30% LAD, 85% mid RCA and 95% distal RCA s/p PCI of the mid to distal RCA and now on DAPT with ASA and Ticagrelor.    . Eczema   . Excessive daytime sleepiness 11/26/2015  . Fibromyalgia   . Gallstones   . GERD (gastroesophageal reflux disease)   . Heart murmur    "noted for the 1st time on 04/18/2018"  . History of blood transfusion 07/2010   "S/P oophorectomy"  . History of gout   . History of hiatal hernia 1980s   "gone now" (04/18/2018)  . Hyperlipidemia   . Hypertension    takes Metoprolol and Enalapril daily  . Hypothyroidism    takes Synthroid daily  . IBS (irritable bowel syndrome)   . Migraine    "nothing in the 2000s" (04/18/2018)  . Mixed connective tissue disease ([place])   . NAFLD (nonalcoholic fatty liver disease)   . Pneumonia    "several times" (04/18/2018)  . Rheumatoid arthritis ([place])    "hands, elbows, shoulders,  probably knees" (04/18/2018)  . Scoliosis   . Spondylosis   . Type II diabetes mellitus ([facility])    takes Metformin and Hum R daily (04/18/2018)    Objective: BP 140/68 (BP Location: Left Arm, Patient Position: Sitting, Cuff Size: Large)   Pulse (!) 107   Temp (!) 97.5 F (36.4 C) (Temporal)   Resp (!) 22   Wt (!) 324 lb 3.2 oz (147.1 kg)   SpO2 97%   BMI 59.30 kg/m  General: Awake, appears stated age HEENT: MMM, EOMi Heart: Reg rhythm, HR around 84 during my exam, no bruits Lungs: Faint wheezing in bases but otherwise clear. No accessory muscle use MSK: +TTP over the lower rib cage at R 10 region b/l at ant axillary line Psych: Age appropriate judgment and insight, normal affect and mood  Assessment and Plan: Acute asthma  Chest wall pain  Pain of right thigh  Diabetic polyneuropathy associated with type 2 diabetes mellitus ([place]) - Plan: HgB A1c, CANCELED: HgB A1c  Tx w pred burst.  Chest wall pain reproducible to palpation suggesting msk etiology.  Does not seem to be in correct spot for DVT. Will consider imaging if no better early next week with ice, heat, stretching.  We will be taking over her diabetes. She is statin intolerant. Ck A1c.  F/u in 3 mo.  The patient voiced understanding and agreement to the plan.  Jilda Roche [place], DO 08/16/19  12:08 PM

## 2019-08-18 ENCOUNTER — Encounter: Payer: Self-pay | Admitting: *Deleted

## 2019-08-18 ENCOUNTER — Telehealth: Payer: Self-pay | Admitting: Cardiology

## 2019-08-18 DIAGNOSIS — R0789 Other chest pain: Secondary | ICD-10-CM

## 2019-08-18 NOTE — Telephone Encounter (Signed)
Spoke with pt and notes SOB with walking to bathroom and chest tightness Per pt has non productive  occ cough Pt feels these symptoms are the same as previous to stents being done Encouraged pt to go to ED Per pt just went and troponin levels were drawn and are normal was not admitted continues to have pain  Encourage pt to try and take NTG per pt thought if had to take that was suppose to go to ED Informed pt only if has to take 3 in a 15 minute period with no relief Pt has virtual visit with Dr Radford Pax on 08/28/19 encouraged to keep B/p today is 124/63 Will forward to Dr Radford Pax for review and recommendations ./cy

## 2019-08-18 NOTE — Telephone Encounter (Signed)
She went to the ER on 11/20.  Are her symptoms about the same or worse than last ER visit

## 2019-08-18 NOTE — Telephone Encounter (Signed)
New Message  Pt c/o Shortness Of Breath: STAT if SOB developed within the last 24 hours or pt is noticeably SOB on the phone  1. Are you currently SOB (can you hear that pt is SOB on the phone)? Yes  2. How long have you been experiencing SOB? About 3 months; have gotten progressively worse  3. Are you SOB when sitting or when up moving around? Moving around  4. Are you currently experiencing any other symptoms? Asthma; chest pain

## 2019-08-18 NOTE — Progress Notes (Signed)
[name], [name] ([id number]) Visit Report for 08/14/2019 Arrival Information Details Patient Name: Date of Service: [name] 08/14/2019 10:00 AM Medical Record [id number] Patient Account Number: [id number] Date of Birth/Sex: Treating RN: 27-Feb-1961 (58 y.o. [name] Primary Care Madysin Crisp: [name] Other Clinician: Referring Leonard Feigel: Treating Sheilla Maris/Extender:Robson, Arlis Porta, Archie Patten in Treatment: 3 Visit Information History Since Last Visit Added or deleted any medications: No Patient Arrived: Gilford Rile Any new allergies or adverse reactions: No Arrival Time: 10:12 Had a fall or experienced change in No Accompanied By: husband activities of daily living that may affect Transfer Assistance: None risk of falls: Patient Identification Verified: Yes Signs or symptoms of abuse/neglect since last No Secondary Verification Process Yes visito Completed: Hospitalized since last visit: No Patient Requires Transmission- No Implantable device outside of the clinic excluding No Based Precautions: cellular tissue based products placed in the center Patient Has Alerts: Yes since last visit: Patient Alerts: Patient on Blood Has Dressing in Place as Prescribed: Yes Thinner Has Compression in Place as Prescribed: No Pain Present Now: No Electronic Signature(s) Signed: 08/18/2019 5:51:44 PM By: Levan Hurst RN, BSN Entered By: Levan Hurst on 08/14/2019 10:12:58 -------------------------------------------------------------------------------- Compression Therapy Details Patient Name: Date of Service: Hansman, Hertha M. 08/14/2019 10:00 AM Medical Record [id number] Patient Account Number: [id number] Date of Birth/Sex: Treating RN: [birth year] (58 y.o. [name] Primary Care Josphine Laffey: [name] Other Clinician: Referring Sasha Rueth: Treating Guynell Kleiber/Extender:Robson, Arlis Porta, Archie Patten in Treatment:  3 Compression Therapy Performed for Wound Wound #2R Right,Medial Lower Leg Assessment: Performed By: Clinician Baruch Gouty, RN Compression Type: Three Layer Pre Treatment ABI: 0.9 Post Procedure Diagnosis Same as Pre-procedure Electronic Signature(s) Signed: 08/14/2019 6:37:14 PM By: Deon Pilling Entered By: Deon Pilling on 08/14/2019 11:11:19 -------------------------------------------------------------------------------- Compression Therapy Details Patient Name: Date of Service: Loux, Fujie M. 08/14/2019 10:00 AM Medical Record [id number] Patient Account Number: [id number] Date of Birth/Sex: Treating RN: 1961-02-22 (58 y.o. [name] Primary Care Nezzie Manera: [name] Other Clinician: Referring Tomia Enlow: Treating Ayse Mccartin/Extender:Robson, Arlis Porta, Archie Patten in Treatment: 3 Compression Therapy Performed for Wound Wound #1 Right Calcaneus Assessment: Performed By: Clinician Baruch Gouty, RN Compression Type: Three Layer Pre Treatment ABI: 0.9 Post Procedure Diagnosis Same as Pre-procedure Electronic Signature(s) Signed: 08/14/2019 6:37:14 PM By: Deon Pilling Entered By: Deon Pilling on 08/14/2019 11:11:20 -------------------------------------------------------------------------------- Compression Therapy Details Patient Name: Date of Service: Stirling, Jalani M. 08/14/2019 10:00 AM Medical Record [id number] Patient Account Number: [id number] Date of Birth/Sex: Treating RN: May 29, 1961 (58 y.o. [name] Primary Care Baden Betsch: [name] Other Clinician: Referring Lanisha Stepanian: Treating Mckaylah Bettendorf/Extender:Robson, Arlis Porta, Archie Patten in Treatment: 3 Compression Therapy Performed for Wound Wound #7 Right,Medial Malleolus Assessment: Performed By: Clinician Baruch Gouty, RN Compression Type: Three Layer Pre Treatment ABI: 0.9 Post Procedure Diagnosis Same as Pre-procedure Electronic  Signature(s) Signed: 08/14/2019 6:37:14 PM By: Deon Pilling Entered By: Deon Pilling on 08/14/2019 11:11:20 -------------------------------------------------------------------------------- Compression Therapy Details Patient Name: Date of Service: Deringer, Yemaya M. 08/14/2019 10:00 AM Medical Record [id number] Patient Account Number: [id number] Date of Birth/Sex: Treating RN: 04-12-1961 (58 y.o. [name] Primary Care Eria Lozoya: [name] Other Clinician: Referring Kenyanna Grzesiak: Treating Abygayle Deltoro/Extender:Robson, Arlis Porta, Archie Patten in Treatment: 3 Compression Therapy Performed for Wound NonWound Condition Lymphedema - Left Leg Assessment: Performed By: Clinician Baruch Gouty, RN Compression Type: Three Layer Post Procedure Diagnosis Same as Pre-procedure Electronic Signature(s) Signed: 08/14/2019 6:37:14 PM By: Deon Pilling Entered By: Deon Pilling on 08/14/2019 11:12:25 -------------------------------------------------------------------------------- Encounter Discharge Information Details Patient Name: Date of Service:  [name], [name] 08/14/2019 10:00 AM Medical Record [id number] Patient Account Number: [id number] Date of Birth/Sex: Treating RN: 25-Nov-1960 (58 y.o. [name] Primary Care Kammy Klett: [name] Other Clinician: Referring Adisa Litt: Treating Mayley Lish/Extender:Robson, Arlis Porta, Archie Patten in Treatment: 3 Encounter Discharge Information Items Post Procedure Vitals Discharge Condition: Stable Temperature (F): 98.2 Ambulatory Status: Walker Pulse (bpm): 107 Discharge Destination: Home Respiratory Rate (breaths/min): 22 Transportation: Private Auto Blood Pressure (mmHg): 157/85 Accompanied By: husband Schedule Follow-up Appointment: Yes Clinical Summary of Care: Patient Declined Electronic Signature(s) Signed: 08/18/2019 5:51:44 PM By: Levan Hurst RN, BSN Entered By: Levan Hurst on  08/14/2019 11:54:24 -------------------------------------------------------------------------------- Lower Extremity Assessment Details Patient Name: Date of Service: [name], [name] 08/14/2019 10:00 AM Medical Record [id number] Patient Account Number: [id number] Date of Birth/Sex: Treating RN: 03-May-1961 (58 y.o. [name] Primary Care Sakshi Sermons: [name] Other Clinician: Referring Celese Banner: Treating Shanya Ferriss/Extender:Robson, Arlis Porta, Archie Patten in Treatment: 3 Edema Assessment Assessed: [Left: No] [Right: No] Edema: [Left: Yes] [Right: Yes] Calf Left: Right: Point of Measurement: 35 cm From Medial Instep 47 cm 43.2 cm Ankle Left: Right: Point of Measurement: 10 cm From Medial Instep 24.5 cm 24.8 cm Vascular Assessment Pulses: Dorsalis Pedis Palpable: [Left:Yes] [Right:Yes] Electronic Signature(s) Signed: 08/18/2019 5:51:44 PM By: Levan Hurst RN, BSN Entered By: Levan Hurst on 08/14/2019 11:33:15 -------------------------------------------------------------------------------- Multi Wound Chart Details Patient Name: Date of Service: [name], [name] 08/14/2019 10:00 AM Medical Record [id number] Patient Account Number: [id number] Date of Birth/Sex: Treating RN: [birth year] (58 y.o. 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] [3:Atypical] Comorbid  History: [1:Cataracts, Lymphedema, Cataracts, Lymphedema, Cataracts, Lymphedema, Asthma, Congestive Heart Asthma, Congestive Heart Asthma, Congestive Heart Failure, Coronary Artery Disease, Hypertension, Type II Diabetes, Rheumatoid Arthritis,  Neuropathy] [2R:Failure, Coronary Artery Disease, Hypertension, Type II Diabetes, Rheumatoid Arthritis, Neuropathy] [3:Failure, Coronary Artery Disease, Hypertension, Type II Diabetes, Rheumatoid Arthritis, Neuropathy] Date Acquired: [1:09/11/2016] [2R:07/21/2019] [3:05/13/2019] Weeks of Treatment: [1:3] [2R:3] [3:3] Wound Status: [1:Open] [2R:Open] [3:Open] Wound Recurrence: [1:No] [2R:Yes] [3:No] Measurements L x W x D 0.7x2x0.1 [2R:0.4x0.3x0.1] [3:0.2x0.2x0.1] (cm) Area (cm) : [1:1.1] [2R:0.094] [3:0.031] Volume (cm) : [1:0.11] [2R:0.009] [3:0.003] % Reduction in Area: [1:51.00%] [2R:20.30%] [3:93.80%] % Reduction in Volume: 75.50% [2R:25.00%] [3:94.00%] Classification: [1:Grade 2] [2R:Full Thickness Without Exposed Support Structures Exposed Support Structures] [3:Full Thickness Without] Exudate Amount: [1:Medium] [2R:Small] [3:Small] Exudate Type: [1:Serous] [2R:Serosanguineous] [3:Serosanguineous] Exudate Color: [1:amber] [2R:red, brown] [3:red, brown] Wound Margin: [1:Distinct, outline attached Flat and Intact] [3:Flat and Intact] Granulation Amount: [1:Large (67-100%)] [2R:Large (67-100%)] [3:Large (67-100%)] Granulation Quality: [1:Pink] [2R:Red] [3:Pink] Necrotic Amount: [1:None Present (0%)] [2R:None Present (0%)] [3:None Present (0%)] Necrotic Tissue: [1:N/A] [2R:N/A] [3:N/A] Exposed Structures: [1:Fat Layer (Subcutaneous Fat Layer (Subcutaneous Fat Layer (Subcutaneous Tissue) Exposed: Yes Fascia: No Tendon: No Muscle: No Joint: No Bone: No] [2R:Tissue) Exposed: Yes Fascia: No Tendon: No Muscle: No Joint: No Bone: No] [3:Tissue) Exposed: Yes  Fascia: No Tendon: No Muscle: No Joint: No Bone: No] Epithelialization: [1:Small (1-33%)] [2R:None]  [3:Large (67-100%)] Debridement: [1:N/A] [2R:N/A] [3:N/A] Pain Control: [1:N/A] [2R:N/A] [3:N/A] Tissue Debrided: [1:N/A] [2R:N/A] [3:N/A] Level: [1:N/A] [2R:N/A] [3:N/A] Debridement Area (sq cm):N/A [2R:N/A] [3:N/A] Instrument: [1:N/A] [2R:N/A] [  3:N/A] Bleeding: [1:N/A] [2R:N/A] [3:N/A] Hemostasis Achieved: [1:N/A] [2R:N/A] [3:N/A] Procedural Pain: [1:N/A] [2R:N/A] [3:N/A] Post Procedural Pain: [1:N/A] [2R:N/A] [3:N/A] Debridement Treatment [1:N/A] [2R:N/A] [3:N/A] Response: Post Debridement [1:N/A] [2R:N/A] [3:N/A] Measurements L x W x D (cm) Post Debridement [1:N/A] [2R:N/A] [3:N/A] Volume: (cm) Procedures Performed: [1:Compression Therapy 4] [2R:Compression Therapy] [3:N/A 5] Photos: [1:No Photos] [2R:No Photos] [3:No Photos] Wound Location: [1:Right Abdomen - Lower Quadrant - Medial] [2R:Right Abdomen - Lower Quadrant - Lateral] [3:Abdomen - midline - Distal] Wounding Event: [1:Gradually Appeared] [2R:Gradually Appeared] [3:Gradually Appeared] Primary Etiology: [1:Atypical] [2R:Atypical] [3:Atypical] Comorbid History: [1:Cataracts, Lymphedema, Cataracts, Lymphedema, Cataracts, Lymphedema, Asthma, Congestive Heart Asthma, Congestive Heart Asthma, Congestive Heart Failure, Coronary Artery Disease, Hypertension, Type II Diabetes, Rheumatoid Arthritis,  Neuropathy] [2R:Failure, Coronary Artery Disease, Hypertension, Type II Diabetes, Rheumatoid Arthritis, Neuropathy] [3:Failure, Coronary Artery Disease, Hypertension, Type II Diabetes, Rheumatoid Arthritis, Neuropathy] Date Acquired: [1:05/13/2019] [2R:05/13/2019] [3:05/13/2019] Weeks of Treatment: [1:3] [2R:3] [3:3] Wound Status: [1:Open] [2R:Healed - Epithelialized] [3:Open] Wound Recurrence: [1:No] [2R:No] [3:No] Measurements L x W x D 1.2x1.3x0.1 [2R:0x0x0] [3:0.4x0.3x0.1] (cm) Area (cm) : [1:1.225] [2R:0] [3:0.094] Volume (cm) : [1:0.123] [2R:0] [3:0.009] % Reduction in Area: [1:35.00%] [2R:100.00%] [3:81.60%] % Reduction in  Volume: 34.60% [2R:100.00%] [3:82.40%] Classification: [1:Full Thickness Without Exposed Support Structures Exposed Support Structures Exposed Support Structures] [2R:Full Thickness Without] [3:Full Thickness Without] Exudate Amount: [1:Medium] [2R:None Present] [3:Small] Exudate Type: [1:Serosanguineous] [2R:N/A] [3:Serosanguineous] Exudate Color: [1:red, brown] [2R:N/A] [3:red, brown] Wound Margin: [1:Flat and Intact] [2R:Flat and Intact] [3:Flat and Intact] Granulation Amount: [1:Large (67-100%)] [2R:None Present (0%)] [3:Large (67-100%)] Granulation Quality: [1:Pink] [2R:N/A] [3:Pink] Necrotic Amount: [1:Small (1-33%)] [2R:None Present (0%)] [3:None Present (0%)] Necrotic Tissue: [1:Eschar] [2R:N/A] [3:N/A] Exposed Structures: [1:Fat Layer (Subcutaneous Fascia: No Tissue) Exposed: Yes Fascia: No Tendon: No Muscle: No Joint: No Bone: No] [2R:Fat Layer (Subcutaneous Tissue) Exposed: Yes Tissue) Exposed: No Tendon: No Muscle: No Joint: No Bone: No] [3:Fat Layer (Subcutaneous  Fascia: No Tendon: No Muscle: No Joint: No Bone: No] Epithelialization: [1:Small (1-33%)] [2R:Large (67-100%)] [3:Medium (34-66%)] Debridement: [1:Debridement - Selective/Open Wound] [2R:N/A] [3:Debridement - Selective/Open Wound] Pre-procedure [1:11:12] [2R:N/A] [3:11:12] Verification/Time Out Taken: Pain Control: [1:Lidocaine 4% Topical Solution] [2R:N/A] [3:Lidocaine 4% Topical Solution] Tissue Debrided: [1:Slough] [2R:N/A] [3:Slough] Level: [1:Skin/Dermis] [2R:N/A] [3:Skin/Dermis] Debridement Area (sq cm):1.56 [2R:N/A] [3:0.12] Instrument: [1:Curette] [2R:N/A Curette] Bleeding: [1:Minimum] [2R:N/A Minimum] Hemostasis Achieved: [1:Pressure] [2R:N/A Pressure] Procedural Pain: [1:0] [2R:N/A 0] Post Procedural Pain: [1:0] [2R:N/A 0] Debridement Treatment [1:Procedure was tolerated] [2R:N/A Procedure was tolerated] Response: [1:well] [2R:well] Post Debridement [1:1.2x1.3x0.1] [2R:N/A 0.4x0.3x0.1] Measurements L x W  x D (cm) Post Debridement [1:0.123] [2R:N/A 0.009] Volume: (cm) Procedures Performed: [1:Debridement 7] [2R:N/A Debridement N/A] [3:N/A] Photos: [1:No Photos] [2R:N/A N/A] Wound Location: [1:Right Malleolus - Medial] [2R:N/A N/A] Wounding Event: [1:Gradually Appeared] [2R:N/A N/A] Primary Etiology: [1:Lymphedema] [2R:N/A N/A] Comorbid History: [1:Cataracts, Lymphedema, N/A Asthma, Congestive Heart Failure, Coronary Artery Disease, Hypertension, Type II Diabetes, Rheumatoid Arthritis, Neuropathy] [2R:N/A] Date Acquired: [1:07/31/2019] [2R:N/A N/A] Weeks of Treatment: [1:2] [2R:N/A N/A] Wound Status: [1:Open] [2R:N/A N/A] Wound Recurrence: [1:No] [2R:N/A N/A] Measurements L x W x D 1.5x2x0.1 [2R:N/A N/A] (cm) Area (cm) : [1:2.356] [2R:N/A N/A] Volume (cm) : [1:0.236] [2R:N/A N/A] % Reduction in Area: [1:-76.50%] [2R:N/A N/A] % Reduction in Volume: -76.10% [2R:N/A N/A] Classification: [1:Full Thickness Without Exposed Support Structures] [2R:N/A N/A] Exudate Amount: [1:Small] [2R:N/A N/A] Exudate Type: [1:Serous] [2R:N/A N/A] Exudate Color: [1:amber] [2R:N/A N/A] Wound Margin: [1:Distinct, outline attached N/A] [2R:N/A] Granulation Amount: [1:Large (67-100%)] [2R:N/A N/A] Granulation Quality: [1:Pink] [2R:N/A N/A] Necrotic Amount: [1:None Present (0%)] [2R:N/A N/A] Necrotic  Tissue: [1:N/A] [2R:N/A N/A] Exposed Structures: [1:Fat Layer (Subcutaneous N/A Tissue) Exposed: Yes Fascia: No Tendon: No Muscle: No Joint: No Bone: No] [2R:N/A] Epithelialization: [1:Medium (34-66%)] [2R:N/A N/A] Debridement: [1:N/A] [2R:N/A N/A] Pain Control: [1:N/A] [2R:N/A N/A] Tissue Debrided: [1:N/A] [2R:N/A N/A] Level: [1:N/A] [2R:N/A N/A] Debridement Area (sq cm):N/A [2R:N/A N/A] Instrument: [1:N/A] [2R:N/A N/A] Bleeding: [1:N/A] [2R:N/A N/A] Hemostasis Achieved: [1:N/A] [2R:N/A] Procedural Pain: [1:N/A] [2R:N/A] Post Procedural Pain: [1:N/A] [2R:N/A] Debridement Treatment [1:N/A]  [2R:N/A] Response: Post Debridement [1:N/A] [2R:N/A] Measurements L x W x D (cm) Post Debridement [1:N/A] [2R:N/A] Volume: (cm) Procedures Performed: [1:Compression Therapy] [2R:N/A] Treatment Notes Electronic Signature(s) Signed: 08/15/2019 7:57:15 AM By: Linton Ham MD Entered By: Linton Ham on 08/14/2019 11:44:33 -------------------------------------------------------------------------------- Multi-Disciplinary Care Plan Details Patient Name: Date of Service: Metivier, Linell M. 08/14/2019 10:00 AM Medical Record [id number] Patient Account Number: [id number] Date of Birth/Sex: Treating RN: June 13, 1961 (58 y.o. [name] Primary Care Macayla Ekdahl: [name] Other Clinician: Referring Modest Draeger: Treating Jaritza Duignan/Extender:Robson, Arlis Porta, Archie Patten in Treatment: 3 Active Inactive Pressure Nursing Diagnoses: Knowledge deficit related to causes and risk factors for pressure ulcer development Potential for impaired tissue integrity related to pressure, friction, moisture, and shear Goals: Patient/caregiver will verbalize understanding of pressure ulcer management Date Initiated: 07/23/2019 Target Resolution Date: 08/20/2019 Goal Status: Active Interventions: Assess offloading mechanisms upon admission and as needed Notes: Venous Leg Ulcer Nursing Diagnoses: Knowledge deficit related to disease process and management Potential for venous Insuffiency (use before diagnosis confirmed) Goals: Patient will maintain optimal edema control Date Initiated: 07/23/2019 Target Resolution Date: 08/20/2019 Goal Status: Active Interventions: Assess peripheral edema status every visit. Compression as ordered Treatment Activities: Therapeutic compression applied : 07/23/2019 Notes: Wound/Skin Impairment Nursing Diagnoses: Impaired tissue integrity Knowledge deficit related to ulceration/compromised skin integrity Goals: Patient/caregiver will  verbalize understanding of skin care regimen Date Initiated: 07/23/2019 Date Inactivated: 08/14/2019 Target Resolution Date: 08/20/2019 Goal Status: Met Ulcer/skin breakdown will have a volume reduction of 30% by week 4 Date Initiated: 07/23/2019 Target Resolution Date: 08/20/2019 Goal Status: Active Interventions: Assess patient/caregiver ability to obtain necessary supplies Assess patient/caregiver ability to perform ulcer/skin care regimen upon admission and as needed Assess ulceration(s) every visit Treatment Activities: Skin care regimen initiated : 07/23/2019 Topical wound management initiated : 07/23/2019 Notes: Electronic Signature(s) Signed: 08/14/2019 6:37:14 PM By: Deon Pilling Entered By: Deon Pilling on 08/14/2019 09:59:34 -------------------------------------------------------------------------------- Pain Assessment Details Patient Name: Date of Service: Diosdado, Pahola M. 08/14/2019 10:00 AM Medical Record [id number] Patient Account Number: [id number] Date of Birth/Sex: Treating RN: August 28, 1961 (58 y.o. [name] Primary Care Hazim Treadway: [name] Other Clinician: Referring Kiyaan Haq: Treating Nova Schmuhl/Extender:Robson, Arlis Porta, Archie Patten in Treatment: 3 Active Problems Location of Pain Severity and Description of Pain Patient Has Paino No Site Locations Pain Management and Medication Current Pain Management: Electronic Signature(s) Signed: 08/18/2019 5:51:44 PM By: Levan Hurst RN, BSN Entered By: Levan Hurst on 08/14/2019 10:17:53 -------------------------------------------------------------------------------- Patient/Caregiver Education Details Patient Name: Date of Service: Krabill, Sarika M. 12/3/2020andnbsp10:00 AM Medical Record Patient Account Number: [id number] Number: Treating RN: Deon Pilling Date of Birth/Gender: 1961-05-29 (58 y.o. F) Other Clinician: Primary Care Physician: Marylin Crosby Referring Physician: Physician/Extender: Tonny Bollman in Treatment: 3 Education Assessment Education Provided To: Patient Education Topics Provided Wound/Skin Impairment: Handouts: Caring for Your Ulcer Methods: Explain/Verbal Responses: Reinforcements needed Electronic Signature(s) Signed: 08/14/2019 6:37:14 PM By: Deon Pilling Entered By: Deon Pilling on 08/14/2019 09:59:47 -------------------------------------------------------------------------------- Wound Assessment Details Patient Name: Date of Service: Feider, Priscilla M. 08/14/2019 10:00 AM Medical  Record [id number] Patient Account Number: [id number] Date of Birth/Sex: Treating RN: [birth year] (58 y.o. [name] Primary Care Princesa Willig: [name] Other Clinician: Referring Skyylar Kopf: Treating Riki Berninger/Extender:Robson, Arlis Porta, Archie Patten in Treatment: 3 Wound Status Wound Number: 1 Primary Diabetic Wound/Ulcer of the Lower Extremity Etiology: Wound Location: Right Calcaneus Wound Open Wounding Event: Blister Status: Date Acquired: 09/11/2016 Comorbid Cataracts, Lymphedema, Asthma, Congestive Weeks Of Treatment: 3 History: Heart Failure, Coronary Artery Disease, Clustered Wound: No Hypertension, Type II Diabetes, Rheumatoid Arthritis, Neuropathy Photos Wound Measurements Length: (cm) 0.7 Width: (cm) 2 Depth: (cm) 0.1 Area: (cm) 1.1 Volume: (cm) 0.11 Wound Description Classification: Grade 2 Wound Margin: Distinct, outline attached Exudate Amount: Medium Exudate Type: Serous Exudate Color: amber Wound Bed Granulation Amount: Large (67-100%) Granulation Quality: Pink Necrotic Amount: None Present (0%) After Cleansing: No brino Yes Exposed Structure posed: No (Subcutaneous Tissue) Exposed: Yes posed: No posed: No osed: No d: No % Reduction in Area: 51% % Reduction in Volume: 75.5% Epithelialization: Small (1-33%) Tunneling:  No Undermining: No Foul Odor Slough/Fi Fascia Ex Fat Layer Tendon Ex Muscle Ex Joint Exp Bone Expose Treatment Notes Wound #1 (Right Calcaneus) 1. Cleanse With Wound Cleanser 2. Periwound Care Moisturizing lotion TCA Cream 3. Primary Dressing Applied Calcium Alginate Ag 4. Secondary Dressing Dry Gauze 6. Support Layer Applied 4 layer compression wrap Notes heel cup Electronic Signature(s) Signed: 08/15/2019 3:46:47 PM By: Mikeal Hawthorne EMT/HBOT Signed: 08/18/2019 5:51:44 PM By: Levan Hurst RN, BSN Entered By: Mikeal Hawthorne on 08/15/2019 09:45:40 -------------------------------------------------------------------------------- Wound Assessment Details Patient Name: Date of Service: Rinn, Lene M. 08/14/2019 10:00 AM Medical Record [id number] Patient Account Number: [id number] Date of Birth/Sex: Treating RN: 1961/01/29 (58 y.o. [name] Primary Care Edenilson Austad: [name] Other Clinician: Referring Juliona Vales: Treating Murrell Dome/Extender:Robson, Arlis Porta, Archie Patten in Treatment: 3 Wound Status Wound Number: 2R Primary Lymphedema Etiology: Wound Location: Right Lower Leg - Medial Wound Open Wounding Event: Blister Status: Date Acquired: 07/21/2019 Comorbid Cataracts, Lymphedema, Asthma, Congestive Weeks Of Treatment: 3 History: Heart Failure, Coronary Artery Disease, Clustered Wound: No Hypertension, Type II Diabetes, Rheumatoid Arthritis, Neuropathy Photos Wound Measurements Length: (cm) 0.4 % Reduct Width: (cm) 0.3 % Reduct Depth: (cm) 0.1 Epitheli Area: (cm) 0.094 Tunneli Volume: (cm) 0.009 Undermi Wound Description Full Thickness Without Exposed Support Foul Odo Classification: Structures Slough/F Wound Flat and Intact Margin: Exudate Small Amount: Exudate Serosanguineous Type: Exudate red, brown Color: Wound Bed Granulation Amount: Large (67-100%) Granulation Quality: Red Fascia E Necrotic Amount: None  Present (0%) Fat Laye Tendon E Muscle E Joint Ex Bone Exp r After Cleansing: No ibrino No Exposed Structure xposed: No r (Subcutaneous Tissue) Exposed: Yes xposed: No xposed: No posed: No osed: No ion in Area: 20.3% ion in Volume: 25% alization: None ng: No ning: No Treatment Notes Wound #2R (Right, Medial Lower Leg) 1. Cleanse With Wound Cleanser 2. Periwound Care Moisturizing lotion TCA Cream 3. Primary Dressing Applied Calcium Alginate Ag 4. Secondary Dressing Dry Gauze 6. Support Layer Applied 4 layer compression wrap Notes heel cup Electronic Signature(s) Signed: 08/15/2019 3:46:47 PM By: Mikeal Hawthorne EMT/HBOT Signed: 08/18/2019 5:51:44 PM By: Levan Hurst RN, BSN Entered By: Mikeal Hawthorne on 08/15/2019 09:44:56 -------------------------------------------------------------------------------- Wound Assessment Details Patient Name: Date of Service: [name], [name] 08/14/2019 10:00 AM Medical Record [id number] Patient Account Number: [id number] Date of Birth/Sex: Treating RN: Apr 05, 1961 (58 y.o. [name] Primary Care Latrel Szymczak: [name] Other Clinician: Referring Niylah Hassan: Treating Jakyra Kenealy/Extender:Robson, Arlis Porta, Archie Patten in Treatment: 3 Wound Status Wound Number: 3 Primary  Atypical Etiology: Wound Location: Abdomen - midline - Proximal Wound Open Wounding Event: Gradually Appeared Status: Date Acquired: 05/13/2019 Comorbid Cataracts, Lymphedema, Asthma, Congestive Weeks Of Treatment: 3 History: Heart Failure, Coronary Artery Disease, Clustered Wound: No Hypertension, Type II Diabetes, Rheumatoid Arthritis, Neuropathy Photos Wound Measurements Length: (cm) 0.2 % Reduct Width: (cm) 0.2 % Reduct Depth: (cm) 0.1 Epitheli Area: (cm) 0.031 Tunneli Volume: (cm) 0.003 Undermi Wound Description Classification: Full Thickness Without Exposed Support Foul Od Structures Slough/ Wound Flat and  Intact Margin: Exudate Small Amount: Exudate Serosanguineous Type: Exudate red, brown Color: Wound Bed Granulation Amount: Large (67-100%) Granulation Quality: Pink Fascia E Necrotic Amount: None Present (0%) Fat Layer Tendon Exp Muscle Exp Joint Expo Bone Expos or After Cleansing: No Fibrino Yes Exposed Structure xposed: No (Subcutaneous Tissue) Exposed: Yes osed: No osed: No sed: No ed: No ion in Area: 93.8% ion in Volume: 94% alization: Large (67-100%) ng: No ning: No Treatment Notes Wound #3 (Proximal Abdomen - midline) 1. Cleanse With Wound Cleanser 3. Primary Dressing Applied Hydrofera Blue 4. Secondary Dressing Foam Border Dressing Electronic Signature(s) Signed: 08/15/2019 3:46:47 PM By: Mikeal Hawthorne EMT/HBOT Signed: 08/18/2019 5:51:44 PM By: Levan Hurst RN, BSN Entered By: Mikeal Hawthorne on 08/15/2019 09:56:09 -------------------------------------------------------------------------------- Wound Assessment Details Patient Name: Date of Service: [name], [name] 08/14/2019 10:00 AM Medical Record [id number] Patient Account Number: [id number] Date of Birth/Sex: Treating RN: 26-Nov-1960 (58 y.o. [name] Primary Care Marquavius Scaife: [name] Other Clinician: Referring Dreonna Hussein: Treating Hallelujah Wysong/Extender:Robson, Arlis Porta, Archie Patten in Treatment: 3 Wound Status Wound Number: 4 Primary Atypical Etiology: Wound Location: Right Abdomen - Lower Quadrant - Medial Wound Open Status: Wounding Event: Gradually Appeared Comorbid Cataracts, Lymphedema, Asthma, Congestive Date Acquired: 05/13/2019 History: Heart Failure, Coronary Artery Disease, Weeks Of Treatment: 3 Hypertension, Type II Diabetes, Rheumatoid Clustered Wound: No Arthritis, Neuropathy Photos Wound Measurements Length: (cm) 1.2 % Reductio Width: (cm) 1.3 % Reduct Depth: (cm) 0.1 Epitheli Area: (cm) 1.225 Tunneli Volume: (cm) 0.123 Undermi Wound  Description Classification: Full Thickness Without Exposed Support Foul Od Structures Slough/ Wound Flat and Intact Margin: Exudate Medium Amount: Exudate Serosanguineous Type: Exudate red, brown Color: Wound Bed Granulation Amount: Large (67-100%) Granulation Quality: Pink Fascia E Necrotic Amount: Small (1-33%) Fat Laye Necrotic Quality: Eschar Tendon E Muscle E Joint Ex Bone Exp or After Cleansing: No Fibrino Yes Exposed Structure xposed: No r (Subcutaneous Tissue) Exposed: Yes xposed: No xposed: No posed: No osed: No n in Area: 35% ion in Volume: 34.6% alization: Small (1-33%) ng: No ning: No Treatment Notes Wound #4 (Right, Medial Abdomen - Lower Quadrant) 1. Cleanse With Wound Cleanser 3. Primary Dressing Applied Hydrofera Blue 4. Secondary Dressing Foam Border Dressing Electronic Signature(s) Signed: 08/15/2019 3:46:47 PM By: Mikeal Hawthorne EMT/HBOT Signed: 08/18/2019 5:51:44 PM By: Levan Hurst RN, BSN Entered By: Mikeal Hawthorne on 08/15/2019 09:56:33 -------------------------------------------------------------------------------- Wound Assessment Details Patient Name: Date of Service: [name], [name] 08/14/2019 10:00 AM Medical Record [id number] Patient Account Number: [id number] Date of Birth/Sex: Treating RN: 04-08-1961 (58 y.o. [name] Primary Care Lucee Brissett: [name] Other Clinician: Referring Jatorian Renault: Treating Haunani Dickard/Extender:Robson, Arlis Porta, Archie Patten in Treatment: 3 Wound Status Wound Number: 5 Primary Atypical Right Abdomen - Lower Quadrant - Etiology: Wound Location: Lateral Wound Healed - Epithelialized Status: Wounding Event: Gradually Appeared Comorbid Cataracts, Lymphedema, Asthma, Congestive Date Acquired: 05/13/2019 History: Heart Failure, Coronary Artery Disease, Weeks Of Treatment: 3 Hypertension, Type II Diabetes, Rheumatoid Clustered Wound: No Arthritis, Neuropathy Photos Wound  Measurements Length: (cm) 0 % Reduct  Width: (cm) 0 % Reduct Depth: (cm) 0 Epitheli Area: (cm) 0 Tunneli Volume: (cm) 0 Undermi Wound Description Full Thickness Without Exposed Support Foul Odo Classification: Structures Slough/F Wound Flat and Intact Margin: Exudate None Present Amount: Wound Bed Granulation Amount: None Present (0%) Necrotic Amount: None Present (0%) Fascia E Fat Laye Tendon E Muscle E Joint Ex Bone Exp r After Cleansing: No ibrino No Exposed Structure xposed: No r (Subcutaneous Tissue) Exposed: No xposed: No xposed: No posed: No osed: No ion in Area: 100% ion in Volume: 100% alization: Large (67-100%) ng: No ning: No Electronic Signature(s) Signed: 08/15/2019 3:46:47 PM By: Mikeal Hawthorne EMT/HBOT Signed: 08/18/2019 5:51:44 PM By: Levan Hurst RN, BSN Entered By: Mikeal Hawthorne on 08/15/2019 09:57:02 -------------------------------------------------------------------------------- Wound Assessment Details Patient Name: Date of Service: [name], [name] 08/14/2019 10:00 AM Medical Record [id number] Patient Account Number: [id number] Date of Birth/Sex: Treating RN: December 13, 1960 (58 y.o. [name] Primary Care Mimie Goering: [name] Other Clinician: Referring Kinisha Soper: Treating Shaurya Rawdon/Extender:Robson, Arlis Porta, Archie Patten in Treatment: 3 Wound Status Wound Number: 6 Primary Atypical Etiology: Wound Location: Abdomen - midline - Distal Wound Open Wounding Event: Gradually Appeared Status: Date Acquired: 05/13/2019 Comorbid Cataracts, Lymphedema, Asthma, Congestive Weeks Of Treatment: 3 History: Heart Failure, Coronary Artery Disease, Clustered Wound: No Hypertension, Type II Diabetes, Rheumatoid Arthritis, Neuropathy Photos Wound Measurements Length: (cm) 0.4 % Reduct Width: (cm) 0.3 % Reduct Depth: (cm) 0.1 Epitheli Area: (cm) 0.094 Tunneli Volume: (cm) 0.009 Undermi Wound  Description Classification: Full Thickness Without Exposed Support Foul Od Structures Slough/ Wound Flat and Intact Margin: Exudate Small Amount: Exudate Serosanguineous Type: Exudate red, brown Color: Wound Bed Granulation Amount: Large (67-100%) Granulation Quality: Pink Fascia E Necrotic Amount: None Present (0%) Fat Laye Tendon E Muscle E Joint Ex Bone Exp or After Cleansing: No Fibrino No Exposed Structure xposed: No r (Subcutaneous Tissue) Exposed: Yes xposed: No xposed: No posed: No osed: No ion in Area: 81.6% ion in Volume: 82.4% alization: Medium (34-66%) ng: No ning: No Treatment Notes Wound #6 (Distal Abdomen - midline) 1. Cleanse With Wound Cleanser 3. Primary Dressing Applied Hydrofera Blue 4. Secondary Dressing Foam Border Dressing Electronic Signature(s) Signed: 08/15/2019 3:46:47 PM By: Mikeal Hawthorne EMT/HBOT Signed: 08/18/2019 5:51:44 PM By: Levan Hurst RN, BSN Entered By: Mikeal Hawthorne on 08/15/2019 09:59:02 -------------------------------------------------------------------------------- Wound Assessment Details Patient Name: Date of Service: [name], [name] 08/14/2019 10:00 AM Medical Record [id number] Patient Account Number: [id number] Date of Birth/Sex: Treating RN: 17-May-1961 (58 y.o. [name] Primary Care Irvine Glorioso: [name] Other Clinician: Referring Lyrical Sowle: Treating Artemio Dobie/Extender:Robson, Arlis Porta, Archie Patten in Treatment: 3 Wound Status Wound Number: 7 Primary Lymphedema Etiology: Wound Location: Right Malleolus - Medial Wound Open Wounding Event: Gradually Appeared Status: Date Acquired: 07/31/2019 Comorbid Cataracts, Lymphedema, Asthma, Congestive Weeks Of Treatment: 2 History: Heart Failure, Coronary Artery Disease, Clustered Wound: No Hypertension, Type II Diabetes, Rheumatoid Arthritis, Neuropathy Photos Wound Measurements Length: (cm) 1.5 % Reduct Width: (cm) 2 %  Reduct Depth: (cm) 0.1 Epitheli Area: (cm) 2.356 Tunneli Volume: (cm) 0.236 Undermi Wound Description Classification: Full Thickness Without Exposed Support Foul Odo Structures Slough/F Wound Distinct, outline attached Margin: Exudate Small Amount: Exudate Exudate Serous Type: Exudate amber Color: Wound Bed Granulation Amount: Large (67-100%) Granulation Quality: Pink Fascia Expos Necrotic Amount: None Present (0%) Fat Layer (S Tendon Expos Muscle Expos Joint Expose Bone Exposed r After Cleansing: No ibrino No Exposed Structure ed: No ubcutaneous Tissue) Exposed: Yes ed: No ed: No d: No : No ion in Area: -  76.5% ion in Volume: -76.1% alization: Medium (34-66%) ng: No ning: No Treatment Notes Wound #7 (Right, Medial Malleolus) 1. Cleanse With Wound Cleanser 2. Periwound Care Moisturizing lotion TCA Cream 3. Primary Dressing Applied Calcium Alginate Ag 4. Secondary Dressing Dry Gauze 6. Support Layer Applied 4 layer compression wrap Notes heel cup Electronic Signature(s) Signed: 08/15/2019 3:46:47 PM By: Mikeal Hawthorne EMT/HBOT Signed: 08/18/2019 5:51:44 PM By: Levan Hurst RN, BSN Entered By: Mikeal Hawthorne on 08/15/2019 09:55:41 -------------------------------------------------------------------------------- [place] Details Patient Name: Date of Service: [name], [name] 08/14/2019 10:00 AM Medical Record [id number] Patient Account Number: [id number] Date of Birth/Sex: Treating RN: 1961-02-27 (58 y.o. [name] Primary Care Vaneta Hammontree: [name] Other Clinician: Referring Tayquan Gassman: Treating Patricia Perales/Extender:Robson, Arlis Porta, Archie Patten in Treatment: 3 Vital Signs Time Taken: 10:15 Temperature (F): 98.2 Height (in): 62 Pulse (bpm): 107 Weight (lbs): 300 Respiratory Rate (breaths/min): 22 Body Mass Index (BMI): 54.9 Blood Pressure (mmHg): 186/80 Reference Range: 80 - 120 mg / dl Electronic  Signature(s) Signed: 08/18/2019 5:51:44 PM By: Levan Hurst RN, BSN Entered By: Levan Hurst on 08/14/2019 10:16:15

## 2019-08-18 NOTE — Telephone Encounter (Signed)
The same .Adonis Housekeeper

## 2019-08-18 NOTE — Telephone Encounter (Signed)
Pt aware of recommendations and order entered ./cy 

## 2019-08-18 NOTE — Telephone Encounter (Signed)
Please have her get a [employer] this week

## 2019-08-19 ENCOUNTER — Telehealth (HOSPITAL_COMMUNITY): Payer: Self-pay | Admitting: *Deleted

## 2019-08-19 NOTE — Progress Notes (Signed)
[name] ([id number]) Visit Report for 07/23/2019 Allergy List Details Patient Name: Date of Service: [name], [name] 07/23/2019 2:45 PM Medical Record [id number] Patient Account Number: [id number] Date of Birth/Sex: Treating RN: [birth year] (58 y.o. [name] Primary Care Provider: [name] Other Clinician: Referring Provider: Treating Provider/Extender:Stone III, Luiz Blare, Archie Patten in Treatment: 0 Allergies Active Allergies fish oil Fish Containing Products Crestor lovastatin metronidazole Red Yeast Rice ([place] Purpureus) rotigotine Atrovent Suprep Bowel Prep Kit adhesive tape Betadine Allergy Notes Electronic Signature(s) Signed: 08/19/2019 2:53:59 PM By: Carlene Coria RN Entered By: Carlene Coria on 07/23/2019 16:18:32 -------------------------------------------------------------------------------- Arrival Information Details Patient Name: Date of Service: [name], [name] 07/23/2019 2:45 PM Medical Record [id number] Patient Account Number: [id number] Date of Birth/Sex: Treating RN: 04/17/1961 (58 y.o. [name] Primary Care Provider: [name] Other Clinician: Referring Provider: Treating Provider/Extender:Stone III, Luiz Blare, Archie Patten in Treatment: 0 Visit Information Patient Arrived: Walker Arrival Time: 15:33 Accompanied By: husband Transfer Assistance: None Patient Identification Verified: Yes Secondary Verification Process Yes Completed: Patient Requires Transmission- No Based Precautions: Patient Has Alerts: Yes Patient Alerts: Patient on Blood Thinner Electronic Signature(s) Signed: 07/28/2019 5:59:11 PM By: Levan Hurst RN, BSN Entered By: Levan Hurst on 07/23/2019 15:39:28 -------------------------------------------------------------------------------- Clinic Level of Care Assessment Details Patient Name: Date of Service: [name], [name] 07/23/2019 2:45  PM Medical Record [id number] Patient Account Number: [id number] Date of Birth/Sex: Treating RN: 02-02-1961 (58 y.o. [name] Primary Care Provider: [name] Other Clinician: Referring Provider: Treating Provider/Extender:Stone III, Luiz Blare, Archie Patten in Treatment: 0 Clinic Level of Care Assessment Items TOOL 1 Quantity Score [] - Use when EandM and Procedure is performed on INITIAL visit 0 ASSESSMENTS - Nursing Assessment / Reassessment X - General Physical Exam (combine w/ comprehensive assessment (listed just below) 1 20 when performed on new pt. evals) X - Comprehensive Assessment (HX, ROS, Risk Assessments, Wounds Hx, etc.) 1 25 ASSESSMENTS - Wound and Skin Assessment / Reassessment [] - Dermatologic / Skin Assessment (not related to wound area) 0 ASSESSMENTS - Ostomy and/or Continence Assessment and Care [] - Incontinence Assessment and Management 0 [] - Ostomy Care Assessment and Management (repouching, etc.) 0 PROCESS - Coordination of Care X - Simple Patient / Family Education for ongoing care 1 15 [] - Complex (extensive) Patient / Family Education for ongoing care 0 X - Staff obtains Programmer, systems, Records, Test Results / Process Orders 1 10 [] - Staff telephones HHA, Nursing Homes / Clarify orders / etc 0 [] - Routine Transfer to another Facility (non-emergent condition) 0 [] - Routine Hospital Admission (non-emergent condition) 0 X - New Admissions / Biomedical engineer / Ordering NPWT, Apligraf, etc. 1 15 [] - Emergency Hospital Admission (emergent condition) 0 PROCESS - Special Needs [] - Pediatric / Minor Patient Management 0 [] - Isolation Patient Management 0 [] - Hearing / Language / Visual special needs 0 [] - Assessment of Community assistance (transportation, D/C planning, etc.) 0 [] - Additional assistance / Altered mentation 0 [] - Support Surface(s) Assessment (bed, cushion, seat, etc.) 0 INTERVENTIONS -  Miscellaneous [] - External ear exam 0 [] - Patient Transfer (multiple staff / Civil Service fast streamer / Similar devices) 0 [] - Simple Staple / Suture removal (25 or less) 0 [] - Complex Staple / Suture removal (26 or more) 0 [] - Hypo/Hyperglycemic Management (do not check if billed separately) 0 X - Ankle / Brachial Index (ABI) - do not check if billed separately 1 15 Has  the patient been seen at the hospital within the last three years: Yes Total Score: 100 Level Of Care: New/Established - Level 3 Electronic Signature(s) Signed: 07/23/2019 5:49:26 PM By: Baruch Gouty RN, BSN Entered By: Baruch Gouty on 07/23/2019 84:69:62 -------------------------------------------------------------------------------- Compression Therapy Details Patient Name: Date of Service: [name], [name] 07/23/2019 2:45 PM Medical Record [id number] Patient Account Number: [id number] Date of Birth/Sex: Treating RN: 07-19-1961 (58 y.o. [name] Primary Care Provider: [name] Other Clinician: Referring Provider: Treating Provider/Extender:Stone III, Luiz Blare, Archie Patten in Treatment: 0 Compression Therapy Performed for Wound Wound #2 Right,Medial Lower Leg Assessment: Performed By: Clinician Deon Pilling, RN Compression Type: Three Layer Post Procedure Diagnosis Same as Pre-procedure Electronic Signature(s) Signed: 07/23/2019 5:49:26 PM By: Baruch Gouty RN, BSN Entered By: Baruch Gouty on 07/23/2019 16:35:13 -------------------------------------------------------------------------------- Compression Therapy Details Patient Name: Date of Service: [name], [name] 07/23/2019 2:45 PM Medical Record [id number] Patient Account Number: [id number] Date of Birth/Sex: Treating RN: Mar 30, 1961 (58 y.o. [name] Primary Care Provider: [name] Other Clinician: Referring Provider: Treating Provider/Extender:Stone III, Luiz Blare, Archie Patten  in Treatment: 0 Compression Therapy Performed for Wound NonWound Condition Lymphedema - Left Leg Assessment: Performed By: Clinician Deon Pilling, RN Compression Type: Three Layer Post Procedure Diagnosis Same as Pre-procedure Electronic Signature(s) Signed: 07/23/2019 5:49:26 PM By: Baruch Gouty RN, BSN Entered By: Baruch Gouty on 07/23/2019 16:36:31 -------------------------------------------------------------------------------- Encounter Discharge Information Details Patient Name: Date of Service: Dattilio, [name] 07/23/2019 2:45 PM Medical Record [id number] Patient Account Number: [id number] Date of Birth/Sex: Treating RN: [birth year] (58 y.o. [name] Primary Care Provider: [name] Other Clinician: Referring Provider: Treating Provider/Extender:Stone III, Luiz Blare, Archie Patten in Treatment: 0 Encounter Discharge Information Items Post Procedure Vitals Discharge Condition: Stable Temperature (F): 98.6 Ambulatory Status: Walker Pulse (bpm): 104 Discharge Destination: Home Respiratory Rate (breaths/min): 20 Transportation: Private Auto Blood Pressure (mmHg): 139/52 Accompanied By: husband Schedule Follow-up Appointment: Yes Clinical Summary of Care: Patient Declined Electronic Signature(s) Signed: 07/28/2019 5:59:11 PM By: Levan Hurst RN, BSN Entered By: Levan Hurst on 07/23/2019 17:07:07 -------------------------------------------------------------------------------- Lower Extremity Assessment Details Patient Name: Date of Service: Mitrano, [name] 07/23/2019 2:45 PM Medical Record [id number] Patient Account Number: [id number] Date of Birth/Sex: Treating RN: 06-10-1961 (58 y.o. [name] Primary Care Provider: [name] Other Clinician: Referring Provider: Treating Provider/Extender:Stone III, Luiz Blare, Archie Patten in Treatment: 0 Edema Assessment Assessed: [Left: No] [Right: No] Edema:  [Left: Ye] [Right: s] Calf Left: Right: Point of Measurement: 35 cm From Medial Instep cm 46 cm Ankle Left: Right: Point of Measurement: 10 cm From Medial Instep cm 24 cm Vascular Assessment Pulses: Dorsalis Pedis Palpable: [Left:Yes] [Right:Yes] Doppler Audible: [Left:Yes] [Right:Yes] Blood Pressure: Brachial: [Right:139] Ankle: Electronic Signature(s) Signed: 07/28/2019 5:59:11 PM By: Levan Hurst RN, BSN Entered By: Levan Hurst on 07/23/2019 15:55:19 -------------------------------------------------------------------------------- [place] Details Patient Name: Date of Service: Corro, [name] 07/23/2019 2:45 PM Medical Record [id number] Patient Account Number: [id number] Date of Birth/Sex: Treating RN: 1960-10-15 (58 y.o. [name] Primary Care Provider: [name] Other Clinician: Referring Provider: Treating Provider/Extender:Stone III, Luiz Blare, Archie Patten in Treatment: 0 Active Inactive Pressure Nursing Diagnoses: Knowledge deficit related to causes and risk factors for pressure ulcer development Potential for impaired tissue integrity related to pressure, friction, moisture, and shear Goals: Patient/caregiver will verbalize understanding of pressure ulcer management Date Initiated: 07/23/2019 Target Resolution Date: 08/20/2019 Goal Status: Active Interventions: Assess offloading mechanisms upon admission and as needed Notes: Venous Leg Ulcer Nursing Diagnoses: Knowledge  deficit related to disease process and management Potential for venous Insuffiency (use before diagnosis confirmed) Goals: Patient will maintain optimal edema control Date Initiated: 07/23/2019 Target Resolution Date: 08/20/2019 Goal Status: Active Interventions: Assess peripheral edema status every visit. Compression as ordered Treatment Activities: Therapeutic compression applied : 07/23/2019 Notes: Wound/Skin  Impairment Nursing Diagnoses: Impaired tissue integrity Knowledge deficit related to ulceration/compromised skin integrity Goals: Patient/caregiver will verbalize understanding of skin care regimen Date Initiated: 07/23/2019 Target Resolution Date: 08/20/2019 Goal Status: Active Ulcer/skin breakdown will have a volume reduction of 30% by week 4 Date Initiated: 07/23/2019 Target Resolution Date: 08/20/2019 Goal Status: Active Interventions: Assess patient/caregiver ability to obtain necessary supplies Assess patient/caregiver ability to perform ulcer/skin care regimen upon admission and as needed Assess ulceration(s) every visit Treatment Activities: Skin care regimen initiated : 07/23/2019 Topical wound management initiated : 07/23/2019 Notes: Electronic Signature(s) Signed: 07/23/2019 5:49:26 PM By: Baruch Gouty RN, BSN Entered By: Baruch Gouty on 07/23/2019 16:27:38 -------------------------------------------------------------------------------- Pain Assessment Details Patient Name: Date of Service: Domine, [name] 07/23/2019 2:45 PM Medical Record [id number] Patient Account Number: [id number] Date of Birth/Sex: Treating RN: 05/01/1961 (58 y.o. [name] Primary Care Provider: [name] Other Clinician: Referring Provider: Treating Provider/Extender:Stone III, Luiz Blare, Archie Patten in Treatment: 0 Active Problems Location of Pain Severity and Description of Pain Patient Has Paino Yes Site Locations Pain Location: Pain in Ulcers With Dressing Change: Yes Duration of the Pain. Constant / Intermittento Intermittent Rate the pain. Current Pain Level: 4 Worst Pain Level: 8 Least Pain Level: 0 Character of Pain Describe the Pain: Throbbing Pain Management and Medication Current Pain Management: Medication: Yes Cold Application: No Rest: No Massage: No Activity: No T.E.N.S.: No Heat Application: No Leg drop or elevation:  No Is the Current Pain Management Adequate: Adequate How does your wound impact your activities of daily livingo Sleep: No Bathing: No Appetite: No Relationship With Others: No Bladder Continence: No Emotions: No Bowel Continence: No Work: No Toileting: No Drive: No Dressing: No Hobbies: No Electronic Signature(s) Signed: 07/28/2019 5:59:11 PM By: Levan Hurst RN, BSN Entered By: Levan Hurst on 07/23/2019 16:02:07 -------------------------------------------------------------------------------- Patient/Caregiver Education Details Patient Name: Greenwalt, [name] 11/11/2020andnbsp2:45 Date of Service: PM Medical Record [id number] Number: Patient Account Number: [id number] Treating RN: Date of Birth/Gender: [birth year] (58 y.o. [name]) Other Clinician: Primary Care Physician:Wendling, Inge Rise Referring Physician: Physician/Extender: Tonny Bollman in Treatment: 0 Education Assessment Education Provided To: Patient Education Topics Provided Venous: Handouts: Controlling Swelling with Multilayered Compression Wraps Methods: Explain/Verbal, Printed Responses: Reinforcements needed, State content correctly Welcome To [facility]: Handouts: Welcome [facility] Methods: Explain/Verbal, Printed Responses: Reinforcements needed, State content correctly Wound/Skin Impairment: Handouts: Caring for Your Ulcer, Skin Care Do's and Dont's Methods: Explain/Verbal, Printed Responses: Reinforcements needed, State content correctly Electronic Signature(s) Signed: 07/23/2019 5:49:26 PM By: Baruch Gouty RN, BSN Entered By: Baruch Gouty on 07/23/2019 16:28:38 -------------------------------------------------------------------------------- Wound Assessment Details Patient Name: Date of Service: Helmstetter, [name] 07/23/2019 2:45 PM Medical Record [id number] Patient Account Number: [id number] Date of  Birth/Sex: Treating RN: 03/13/1961 (58 y.o. [name] Primary Care Provider: [name] Other Clinician: Referring Provider: Treating Provider/Extender:Stone III, Luiz Blare, Archie Patten in Treatment: 0 Wound Status Wound Number: 1 Primary Diabetic Wound/Ulcer of the Lower Extremity Etiology: Wound Location: Right Calcaneus Wound Open Wounding Event: Blister Status: Date Acquired: 09/11/2016 Comorbid Cataracts, Lymphedema, Asthma, Congestive Weeks Of Treatment: 0 History: Heart Failure, Coronary Artery Disease, Clustered Wound: No Hypertension,  Type II Diabetes, Rheumatoid Arthritis, Neuropathy Photos Wound Measurements Length: (cm) 1.3 Width: (cm) 2.2 Depth: (cm) 0.2 Area: (cm) 2.246 Volume: (cm) 0.449 Wound Description Classification: Grade 2 Wound Margin: Flat and Intact Exudate Amount: Medium Exudate Type: Serosanguineous Exudate Color: red, brown Wound Bed Granulation Amount: Medium (34-66%) Granulation Quality: Pink Necrotic Amount: Medium (34-66%) Necrotic Quality: Adherent Slough After Cleansing: No rino Yes Exposed Structure sed: No Subcutaneous Tissue) Exposed: Yes sed: No sed: No ed: No d: No % Reduction in Area: 0% % Reduction in Volume: 0% Epithelialization: None Tunneling: No Undermining: No Foul Odor Slough/Fib Fascia Expo Fat Layer ( Tendon Expo Muscle Expo Joint Expos Bone Expose Electronic Signature(s) Signed: 07/25/2019 4:02:07 PM By: Mikeal Hawthorne EMT/HBOT Signed: 07/28/2019 5:59:11 PM By: Levan Hurst RN, BSN Entered By: Mikeal Hawthorne on 07/25/2019 08:50:17 -------------------------------------------------------------------------------- Wound Assessment Details Patient Name: Date of Service: Brereton, [name] 07/23/2019 2:45 PM Medical Record [id number] Patient Account Number: [id number] Date of Birth/Sex: Treating RN: 11/29/1960 (58 y.o. [name] Primary Care Provider: [name] Other Clinician: Referring Provider: Treating Provider/Extender:Stone III, Luiz Blare, Archie Patten in Treatment: 0 Wound Status Wound Number: 2 Primary Lymphedema Etiology: Wound Location: Right Lower Leg - Medial Wound Open Wounding Event: Blister Status: Date Acquired: 07/21/2019 Comorbid Cataracts, Lymphedema, Asthma, Congestive Weeks Of Treatment: 0 History: Heart Failure, Coronary Artery Disease, Clustered Wound: No Hypertension, Type II Diabetes, Rheumatoid Arthritis, Neuropathy Photos Wound Measurements Length: (cm) 0.5 % Reduct Width: (cm) 0.3 % Reduct Depth: (cm) 0.1 Epitheli Area: (cm) 0.118 Tunneli Volume: (cm) 0.012 Undermi Wound Description Classification: Full Thickness Without Exposed Support Foul Od Structures Slough/ Wound Flat and Intact Margin: Exudate Small Amount: Exudate Serous Type: Exudate amber Color: Wound Bed Granulation Amount: Large (67-100%) Granulation Quality: Pale Fascia E Necrotic Amount: None Present (0%) Fat Laye Tendon E Muscle E Joint Ex Bone Exp or After Cleansing: No Fibrino No Exposed Structure xposed: No r (Subcutaneous Tissue) Exposed: Yes xposed: No xposed: No posed: No osed: No ion in Area: 0% ion in Volume: 0% alization: None ng: No ning: No Electronic Signature(s) Signed: 07/25/2019 4:02:07 PM By: Mikeal Hawthorne EMT/HBOT Signed: 07/28/2019 5:59:11 PM By: Levan Hurst RN, BSN Entered By: Mikeal Hawthorne on 07/25/2019 08:51:34 -------------------------------------------------------------------------------- Wound Assessment Details Patient Name: Date of Service: Ortmann, [name] 07/23/2019 2:45 PM Medical Record [id number] Patient Account Number: [id number] Date of Birth/Sex: Treating RN: 02/14/1961 (58 y.o. [name] Primary Care Provider: [name] Other Clinician: Referring Provider: Treating Provider/Extender:Stone III, Luiz Blare, Archie Patten in  Treatment: 0 Wound Status Wound Number: 3 Primary Atypical Etiology: Wound Location: Abdomen - midline - Proximal Wound Open Wounding Event: Gradually Appeared Status: Date Acquired: 05/13/2019 Comorbid Cataracts, Lymphedema, Asthma, Congestive Weeks Of Treatment: 0 History: Heart Failure, Coronary Artery Disease, Clustered Wound: No Hypertension, Type II Diabetes, Rheumatoid Arthritis, Neuropathy Photos Wound Measurements Length: (cm) 0.8 % Reduct Width: (cm) 0.8 % Reduct Depth: (cm) 0.1 Epitheli Area: (cm) 0.503 Tunneli Volume: (cm) 0.05 Undermi Wound Description Classification: Full Thickness Without Exposed Support Foul Od Structures Slough/ Wound Flat and Intact Margin: Exudate Medium Amount: Exudate Serosanguineous Type: Exudate red, brown Color: Wound Bed Granulation Amount: None Present (0%) Necrotic Amount: Large (67-100%) Fascia E Necrotic Quality: Adherent Slough Fat Laye Tendon E Muscle E Joint Ex Bone Exp or After Cleansing: No Fibrino Yes Exposed Structure xposed: No r (Subcutaneous Tissue) Exposed: Yes xposed: No xposed: No posed: No osed: No ion in Area: 0% ion in Volume: 0% alization: None ng: No ning:  No Electronic Signature(s) Signed: 07/25/2019 4:02:07 PM By: Mikeal Hawthorne EMT/HBOT Signed: 07/28/2019 5:59:11 PM By: Levan Hurst RN, BSN Entered By: Mikeal Hawthorne on 07/25/2019 08:51:54 -------------------------------------------------------------------------------- Wound Assessment Details Patient Name: Date of Service: Fitchett, [name] 07/23/2019 2:45 PM Medical Record [id number] Patient Account Number: [id number] Date of Birth/Sex: Treating RN: 1961-05-19 (58 y.o. [name] Primary Care Provider: [name] Other Clinician: Referring Provider: Treating Provider/Extender:Stone III, Luiz Blare, Archie Patten in Treatment: 0 Wound Status Wound Number: 4 Primary Atypical Etiology: Wound Location:  Right Abdomen - Lower Quadrant - Medial Wound Open Status: Wounding Event: Gradually Appeared Comorbid Cataracts, Lymphedema, Asthma, Congestive Date Acquired: 05/13/2019 History: Heart Failure, Coronary Artery Disease, Weeks Of Treatment: 0 Hypertension, Type II Diabetes, Rheumatoid Clustered Wound: No Arthritis, Neuropathy Photos Wound Measurements Length: (cm) 1.6 % Reduct Width: (cm) 1.5 % Reduct Depth: (cm) 0.1 Epitheli Area: (cm) 1.885 Tunneli Volume: (cm) 0.188 Undermi Wound Description Classification: Full Thickness Without Exposed Support Foul Od Structures Slough/ Wound Flat and Intact Margin: Exudate Medium Amount: Exudate Serosanguineous Type: Exudate red, brown Color: Wound Bed Granulation Amount: None Present (0%) Necrotic Amount: Large (67-100%) Fascia E Necrotic Quality: Adherent Slough Fat Laye Tendon E Muscle E Joint Ex Bone Exp or After Cleansing: No Fibrino Yes Exposed Structure xposed: No r (Subcutaneous Tissue) Exposed: Yes xposed: No xposed: No posed: No osed: No ion in Area: 0% ion in Volume: 0% alization: None ng: No ning: No Electronic Signature(s) Signed: 07/25/2019 4:02:07 PM By: Mikeal Hawthorne EMT/HBOT Signed: 07/28/2019 5:59:11 PM By: Levan Hurst RN, BSN Entered By: Mikeal Hawthorne on 07/25/2019 08:52:22 -------------------------------------------------------------------------------- Wound Assessment Details Patient Name: Date of Service: Stoneking, [name] 07/23/2019 2:45 PM Medical Record [id number] Patient Account Number: [id number] Date of Birth/Sex: Treating RN: 09/17/1960 (58 y.o. [name] Primary Care Provider: [name] Other Clinician: Referring Provider: Treating Provider/Extender:Stone III, Luiz Blare, Archie Patten in Treatment: 0 Wound Status Wound Number: 5 Primary Atypical Etiology: Wound Location: Right Abdomen - Lower Quadrant - Lateral Wound Open Status: Wounding  Event: Gradually Appeared Comorbid Cataracts, Lymphedema, Asthma, Congestive Date Acquired: 05/13/2019 History: Heart Failure, Coronary Artery Disease, Weeks Of Treatment: 0 Hypertension, Type II Diabetes, Rheumatoid Clustered Wound: No Arthritis, Neuropathy Photos Wound Measurements Length: (cm) 1.2 % Reduct Width: (cm) 1 % Reduct Depth: (cm) 0.1 Epitheli Area: (cm) 0.942 Tunneli Volume: (cm) 0.094 Undermi Wound Description Classification: Full Thickness Without Exposed Support Foul Od Structures Slough/ Wound Flat and Intact Margin: Exudate Medium Amount: Exudate Exudate Serosanguineous Type: Exudate red, brown Color: Wound Bed Granulation Amount: None Present (0%) Necrotic Amount: Large (67-100%) Fascia Expo Necrotic Quality: Adherent Slough Fat Layer ( Tendon Expo Muscle Expo Joint Expos Bone Expose or After Cleansing: No Fibrino Yes Exposed Structure sed: No Subcutaneous Tissue) Exposed: Yes sed: No sed: No ed: No d: No ion in Area: 0% ion in Volume: 0% alization: None ng: No ning: No Electronic Signature(s) Signed: 07/25/2019 4:02:07 PM By: Mikeal Hawthorne EMT/HBOT Signed: 07/28/2019 5:59:11 PM By: Levan Hurst RN, BSN Entered By: Mikeal Hawthorne on 07/25/2019 08:53:32 -------------------------------------------------------------------------------- Wound Assessment Details Patient Name: Date of Service: Coley, [name] 07/23/2019 2:45 PM Medical Record [id number] Patient Account Number: [id number] Date of Birth/Sex: Treating RN: 1960-10-16 (58 y.o. [name] Primary Care Provider: [name] Other Clinician: Referring Provider: Treating Provider/Extender:Stone III, Luiz Blare, Archie Patten in Treatment: 0 Wound Status Wound Number: 6 Primary Atypical Etiology: Wound Location: Abdomen - midline - Distal Wound Open Wounding Event: Gradually Appeared Status: Date Acquired: 05/13/2019 Comorbid  Cataracts,  Lymphedema, Asthma, Congestive Weeks Of Treatment: 0 History: Heart Failure, Coronary Artery Disease, Clustered Wound: No Hypertension, Type II Diabetes, Rheumatoid Arthritis, Neuropathy Photos Wound Measurements Length: (cm) 1.3 % Reduction Width: (cm) 0.5 % Reduction Depth: (cm) 0.1 Epitheliali Area: (cm) 0.511 Tunneli Volume: (cm) 0.051 Undermi Wound Description Full Thickness Without Exposed Support Foul Od Classification: Structures Slough/ Wound Flat and Intact Margin: Exudate Small Amount: Exudate Serosanguineous Type: Exudate red, brown Color: Wound Bed Granulation Amount: Large (67-100%) Granulation Quality: Pink Fascia E Necrotic Amount: Small (1-33%) Fat Laye Necrotic Quality: Adherent Slough Tendon E Muscle E Joint Ex Bone Exp or After Cleansing: No Fibrino No Exposed Structure xposed: No r (Subcutaneous Tissue) Exposed: Yes xposed: No xposed: No posed: No osed: No in Area: 0% in Volume: 0% zation: None ng: No ning: No Electronic Signature(s) Signed: 07/25/2019 4:02:07 PM By: Mikeal Hawthorne EMT/HBOT Signed: 07/28/2019 5:59:11 PM By: Levan Hurst RN, BSN Entered By: Mikeal Hawthorne on 07/25/2019 08:53:57 -------------------------------------------------------------------------------- Vitals Details Patient Name: Date of Service: Shomaker, [name] 07/23/2019 2:45 PM Medical Record [id number] Patient Account Number: [id number] Date of Birth/Sex: Treating RN: [birth year] (58 y.o. [name] Primary Care Provider: [name] Other Clinician: Referring Provider: Treating Provider/Extender:Stone III, Luiz Blare, Archie Patten in Treatment: 0 Vital Signs Time Taken: 15:40 Temperature (F): 98.6 Height (in): 62 Pulse (bpm): 104 Source: Stated Respiratory Rate (breaths/min): 20 Weight (lbs): 300 Blood Pressure (mmHg): 139/52 Source: Stated Capillary Blood Glucose (mg/dl): 200 Body Mass Index (BMI):  54.9 Reference Range: 80 - 120 mg / dl Notes glucose per pt report Electronic Signature(s) Signed: 07/28/2019 5:59:11 PM By: Levan Hurst RN, BSN Entered By: Levan Hurst on 07/23/2019 15:41:05

## 2019-08-19 NOTE — Telephone Encounter (Signed)
Patient given detailed instructions per Myocardial Perfusion Study Information Sheet for the test on  10/09/18. Patient notified to arrive 15 minutes early and that it is imperative to arrive on time for appointment to keep from having the test rescheduled.  If you need to cancel or reschedule your appointment, please call the office within 24 hours of your appointment. . Patient verbalized understanding. [place] Pelto Jacqueline    

## 2019-08-20 ENCOUNTER — Other Ambulatory Visit: Payer: Self-pay

## 2019-08-20 ENCOUNTER — Ambulatory Visit (HOSPITAL_COMMUNITY): Payer: BC Managed Care – PPO | Attending: Internal Medicine

## 2019-08-20 DIAGNOSIS — R0789 Other chest pain: Secondary | ICD-10-CM

## 2019-08-20 MED ORDER — REGADENOSON 0.4 MG/5ML IV SOLN
0.4000 mg | Freq: Once | INTRAVENOUS | Status: AC
  Administered 2019-08-20: 0.4 mg via INTRAVENOUS

## 2019-08-20 MED ORDER — TECHNETIUM TC 99M TETROFOSMIN IV KIT
32.0000 | PACK | Freq: Once | INTRAVENOUS | Status: AC | PRN
  Administered 2019-08-20: 32 via INTRAVENOUS
  Filled 2019-08-20: qty 32

## 2019-08-21 ENCOUNTER — Ambulatory Visit (HOSPITAL_COMMUNITY): Payer: BC Managed Care – PPO | Attending: Cardiovascular Disease

## 2019-08-21 LAB — MYOCARDIAL PERFUSION IMAGING
LV dias vol: 110 mL (ref 46–106)
LV sys vol: 52 mL
Peak HR: 111 {beats}/min
Rest HR: 105 {beats}/min
SDS: 0
SRS: 4
SSS: 6
TID: 1.28

## 2019-08-21 MED ORDER — TECHNETIUM TC 99M TETROFOSMIN IV KIT
32.0000 | PACK | Freq: Once | INTRAVENOUS | Status: AC | PRN
  Administered 2019-08-21: 32 via INTRAVENOUS
  Filled 2019-08-21: qty 32

## 2019-08-22 ENCOUNTER — Other Ambulatory Visit: Payer: Self-pay

## 2019-08-22 ENCOUNTER — Encounter (HOSPITAL_BASED_OUTPATIENT_CLINIC_OR_DEPARTMENT_OTHER): Payer: BC Managed Care – PPO | Admitting: Internal Medicine

## 2019-08-22 DIAGNOSIS — E11621 Type 2 diabetes mellitus with foot ulcer: Secondary | ICD-10-CM | POA: Diagnosis not present

## 2019-08-25 ENCOUNTER — Telehealth: Payer: Self-pay

## 2019-08-25 NOTE — Progress Notes (Signed)
Lewelling, COUNTESS BIEBEL ([id number]) Visit Report for 08/22/2019 HPI Details Patient Name: Date of Service: Bensinger, CLOTILDE LOTH. 08/22/2019 8:00 AM Medical Record [id number] Patient Account Number: [id number] Date of Birth/Sex: Treating RN: [birth year] (58 y.o. F) Primary Care Provider: [name] Other Clinician: Referring Provider: Treating Provider/Extender:Chelli Yerkes, Arlis Porta, Archie Patten in Treatment: 4 History of Present Illness HPI Description: 07/23/2019 on evaluation today patient presents with a myriad of wounds noted at multiple locations over her right heel, right lower extremity, and abdominal region. She also has bilateral lower extremity lymphedema which is quite significant as well. Incidentally she also has congestive heart failure and hypertension. She also is obese. With that being said I think all this is contributing as well to her lower extremity edema which is very much uncontrolled. It seems like its been at least several months since she is worn any compression according to what she tells me. With that being said I am not sure exactly when that would have been. She does have fibrotic changes in the lower extremity secondary to lymphedema worse on the left than the right. She did show me pictures of wound she had on the anterior portion of her shin which was quite significant fortunately that has healed. These issues have been intermittent at this time. Again I think that with appropriate compression therapy she may actually be doing better than what we are seeing at this point but again I am not really sure that she is ever been extremely compliant with that. Fortunately there is no signs of active infection at this time. No fever chills noted. As far as the abdominal ulcer she initially had one wound that she thinks may have been a bug bite. Subsequently she was put a dressing on this and she states that the tape pulled skin off on other locations  causing other wounds that have not healed that is been about 3 months. The wounds on her lower extremities have been intermittent over 3 years. This includes the heel. 11/19; this is a patient that I have not seen previously. She was admitted to our clinic last week with multiple wounds including several superficial circular areas on her abdomen predominantly right upper quadrant. Also 1 in her umbilicus. She has an area on her right lateral malleolus which was new today. She has bilateral lower extremity edema with very significant stasis dermatitis on the left anterior tibial area. She has tightly adherent skin in her lower extremities probably secondary to cutaneous fibrosis in the area. We put her in compression last week. She comes in with the dressings reasonably saturated. 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 She has multiple areas on her arms from scratching. 12/3; the patient I saw for the first time 2 weeks ago. She has 3 small circular areas on her abdomen 2 in the right upper quadrant one on her umbilicus. We have been using silver alginate to this area. She also had an area on her right lateral malleolus and right heel and right medial malleolus. We have been using silver alginate here under compression. She does not have an open area on the left leg today. We are going to order her stockings for the left leg. She clearly has chronic lymphedema in these areas. X-ray of the foot from 10/20 did not show any fracture or dislocation. The heel wound  was noted there was no evidence of osteomyelitis She complains of generalized pruritus. 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 She has longstanding lymphedema. Although she does not have known liver  disease I looked at his CT scan of the abdomen from February of this year that showed hepatic steatosis 12/11; patient still has no open area on the left leg we transitioned her into a stocking she had. Her stockings from [facility] are supposed to be arriving later today which will be the stockings of choice. The only wound remaining on the right is the right heel 2 small superficial open areas. Everything else is well on its way to healing in the right leg few small excoriations. She has 1 major area remaining on her abdomen the rest seem to be healing Electronic Signature(s) Signed: 08/22/2019 6:34:01 PM By: Linton Ham MD Entered By: Linton Ham on 08/22/2019 08:19:14 -------------------------------------------------------------------------------- Physical Exam Details Patient Name: Date of Service: Shorty, Lorenna M. 08/22/2019 8:00 AM Medical Record [id number] Patient Account Number: [id number] Date of Birth/Sex: Treating RN: 1961/03/28 (58 y.o. F) Primary Care Provider: [name] Other Clinician: Referring Provider: Treating Provider/Extender:[place] Noori, Arlis Porta, Archie Patten in Treatment: 4 Constitutional Patient is hypertensive.. Pulse regular and within target range for patient.Marland Kitchen Respirations regular, non-labored and within target range.. Temperature is normal and within the target range for the patient.Marland Kitchen Appears in no distress. Respiratory work of breathing is normal. Gastrointestinal (GI) Very distended. Abdominal striations look like dilated veins. Nontender no shifting dullness. No liver or spleen enlargement. Integumentary (Hair, Skin) She has chronic venous insufficiency with stasis dermatitis and lymphedema in her bilateral lower legs. Psychiatric appears at normal baseline. Notes Wound exam The right heel is better right lateral malleolus close. We have excellent edema control. The only open area on the right is the right heel. Nothing  open on the right lower leg she can transition to stockings On her abdomen the area is up towards the umbilicus and the scar beneath this look a lot better. She still has 1 in the right upper quadrant that has some depth but it looks better. Electronic Signature(s) Signed: 08/22/2019 6:34:01 PM By: Linton Ham MD Entered By: Linton Ham on 08/22/2019 08:24:16 -------------------------------------------------------------------------------- Physician Orders Details Patient Name: Date of Service: Richeson, Shirlene M. 08/22/2019 8:00 AM Medical Record [id number] Patient Account Number: [id number] Date of Birth/Sex: Treating RN: 1961/07/18 (58 y.o. 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 Skin Barriers/Peri-Wound Care TCA Cream or Ointment Wound Cleansing Wound #1 Right Calcaneus May shower with protection. Wound #4 Right,Medial Abdomen - Lower Quadrant May shower and wash wound with soap and water. Primary Wound Dressing Wound #1 Right Calcaneus Calcium Alginate with Silver Wound #4 Right,Medial Abdomen - Lower Quadrant Hydrofera Blue Secondary Dressing Wound #1 Right Calcaneus Dry Gauze Heel Cup Wound #4 Right,Medial Abdomen - Lower  Quadrant Dry Gauze - or ABD pad Edema Control 4 layer  compression - Right Lower Extremity Avoid standing for long periods of time Elevate legs to the level of the heart or above for 30 minutes daily and/or when sitting, a frequency of: - whenever sitting Exercise regularly Support Garment 20-30 mm/Hg pressure to: - wear compression stocking to left lower leg. moisturize leg with lotion at night after stocking removed. Apply stocking first thing in the morning and remove before bed. Off-Loading Open toe surgical shoe to: - to both feet. Electronic Signature(s) Signed: 08/22/2019 6:34:01 PM By: Linton Ham MD Signed: 08/25/2019 5:56:42 PM By: Kela Millin Entered By: Kela Millin on 08/22/2019 08:18:11 -------------------------------------------------------------------------------- Problem List Details Patient Name: Date of Service: Phaneuf, Tarsha M. 08/22/2019 8:00 AM Medical Record [id number] Patient Account Number: [id number] Date of Birth/Sex: Treating RN: [birth year] (58 y.o. 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 of right heel and midfoot 07/23/2019 No Yes with fat layer exposed L97.812 Non-pressure chronic ulcer of other part of right lower 07/23/2019 No Yes leg with fat layer exposed L98.492 Non-pressure chronic ulcer of skin of other sites with 07/23/2019 No Yes fat layer exposed I50.42 Chronic combined systolic (congestive) and diastolic 86/76/7209 No Yes (congestive) heart failure I10 Essential (primary) hypertension 07/23/2019 No Yes Inactive  Problems Resolved Problems Electronic Signature(s) Signed: 08/22/2019 6:34:01 PM By: Linton Ham MD Entered By: Linton Ham on 08/22/2019 08:17:41 -------------------------------------------------------------------------------- Progress Note Details Patient Name: Date of Service: Vazquez, Josilynn M. 08/22/2019 8:00 AM Medical Record [id number] Patient Account Number: [id number] Date of Birth/Sex: Treating RN: [birth year] (58 y.o. F) Primary Care Provider: [name] Other Clinician: Referring Provider: Treating Provider/Extender:Shiara Mcgough, Arlis Porta, Archie Patten in Treatment: 4 Subjective History of Present Illness (HPI) 07/23/2019 on evaluation today patient presents with a myriad of wounds noted at multiple locations over her right heel, right lower extremity, and abdominal region. She also has bilateral lower extremity lymphedema which is quite significant as well. Incidentally she also has congestive heart failure and hypertension. She also is obese. With that being said I think all this is contributing as well to her lower extremity edema which is very much uncontrolled. It seems like its been at least several months since she is worn any compression according to what she tells me. With that being said I am not sure exactly when that would have been. She does have fibrotic changes in the lower extremity secondary to lymphedema worse on the left than the right. She did show me pictures of wound she had on the anterior portion of her shin which was quite significant fortunately that has healed. These issues have been intermittent at this time. Again I think that with appropriate compression therapy she may actually be doing better than what we are seeing at this point but again I am not really sure that she is ever been extremely compliant with that. Fortunately there is no signs of active infection at this time. No fever chills noted. As far as the abdominal  ulcer she initially had one wound that she thinks may have been a bug bite. Subsequently she was put a dressing on this and she states that the tape pulled skin off on other locations causing other wounds that have not healed that is been about 3 months. The  wounds on her lower extremities have been intermittent over 3 years. This includes the heel. 11/19; this is a patient that I have not seen previously. She was admitted to our clinic last week with multiple wounds including several superficial circular areas on her abdomen predominantly right upper quadrant. Also 1 in her umbilicus. She has an area on her right lateral malleolus which was new today. She has bilateral lower extremity edema with very significant stasis dermatitis on the left anterior tibial area. She has tightly adherent skin in her lower extremities probably secondary to cutaneous fibrosis in the area. We put her in compression last week. She comes in with the dressings reasonably saturated. 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 She has multiple areas on her arms from scratching. 12/3; the patient I saw for the first time 2 weeks ago. She has 3 small circular areas on her abdomen 2 in the right upper quadrant one on her umbilicus. We have been using silver alginate to this area. She also had an area on her right lateral malleolus and right heel and right medial malleolus. We have been using silver alginate here under compression. She does not have an open area on the left leg today. We are going to order her stockings for the left leg. She clearly has chronic lymphedema in these areas. X-ray of the foot from 10/20 did not show any fracture or dislocation. The heel wound was noted there was no evidence of osteomyelitis She  complains of generalized pruritus. 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 She has longstanding lymphedema. Although she does not have known liver disease I looked at his CT scan of the abdomen from February of this year that showed hepatic steatosis 12/11; patient still has no open area on the left leg we transitioned her into a stocking she had. Her stockings from [facility] are supposed to be arriving later today which will be the stockings of choice. The only wound remaining on the right is the right heel 2 small superficial open areas. Everything else is well on its way to healing in the right leg few small excoriations. She has 1 major area remaining on her abdomen the rest seem to be healing Objective Constitutional Patient is hypertensive.. Pulse regular and within target range for patient.Marland Kitchen Respirations regular, non-labored and within target range.. Temperature is normal and within the target range for the patient.Marland Kitchen Appears in no distress. Vitals Time Taken: 7:50 AM, Height: 62 in, Weight: 300 lbs, BMI: 54.9, Temperature: 97.6 F, Pulse: 94 bpm, Respiratory Rate: 22 breaths/min, Blood Pressure: 176/77 mmHg, Capillary Blood Glucose: 160 mg/dl. General Notes: glucose per pt report Respiratory work of breathing is normal. Gastrointestinal (GI) Very distended. Abdominal striations look like dilated veins. Nontender no shifting dullness. No liver or spleen enlargement. Psychiatric appears at normal baseline. General Notes: Wound exam ooThe right heel is better right lateral malleolus close. We have excellent edema control. The only open area on the right is the right heel. ooNothing open on the right lower leg she can transition to stockings ooOn her abdomen the area is up towards the umbilicus and the scar beneath this look a lot better. She still has 1 in the right upper quadrant that has some depth but it looks better. Integumentary (Hair,  Skin) She has chronic venous  insufficiency with stasis dermatitis and lymphedema in her bilateral lower legs. Wound #1 status is Open. Original cause of wound was Blister. The wound is located on the Right Calcaneus. The wound measures 0.2cm length x 0.2cm width x 0.2cm depth; 0.031cm^2 area and 0.006cm^3 volume. There is Fat Layer (Subcutaneous Tissue) Exposed exposed. There is no tunneling or undermining noted. There is a small amount of serous drainage noted. The wound margin is distinct with the outline attached to the wound base. There is large (67-100%) pink, pale granulation within the wound bed. There is no necrotic tissue within the wound bed. Wound #2R status is Healed - Epithelialized. Original cause of wound was Blister. The wound is located on the Right,Medial Lower Leg. The wound measures 0cm length x 0cm width x 0cm depth; 0cm^2 area and 0cm^3 volume. There is no tunneling or undermining noted. There is a none present amount of drainage noted. The wound margin is flat and intact. There is no granulation within the wound bed. There is no necrotic tissue within the wound bed. Wound #3 status is Healed - Epithelialized. Original cause of wound was Gradually Appeared. The wound is located on the Proximal Abdomen - midline. The wound measures 0cm length x 0cm width x 0cm depth; 0cm^2 area and 0cm^3 volume. There is a none present amount of drainage noted. The wound margin is flat and intact. There is no granulation within the wound bed. There is no necrotic tissue within the wound bed. Wound #4 status is Open. Original cause of wound was Gradually Appeared. The wound is located on the Right,Medial Abdomen - Lower Quadrant. The wound measures 0.8cm length x 0.7cm width x 0.1cm depth; 0.44cm^2 area and 0.044cm^3 volume. There is Fat Layer (Subcutaneous Tissue) Exposed exposed. There is no tunneling or undermining noted. There is a medium amount of serosanguineous drainage noted. The wound  margin is flat and intact. There is large (67-100%) pink granulation within the wound bed. There is a small (1-33%) amount of necrotic tissue within the wound bed including Eschar. Wound #6 status is Healed - Epithelialized. Original cause of wound was Gradually Appeared. The wound is located on the Distal Abdomen - midline. The wound measures 0cm length x 0cm width x 0cm depth; 0cm^2 area and 0cm^3 volume. There is no tunneling or undermining noted. There is a none present amount of drainage noted. The wound margin is flat and intact. There is no granulation within the wound bed. There is no necrotic tissue within the wound bed. Wound #7 status is Healed - Epithelialized. Original cause of wound was Gradually Appeared. The wound is located on the Right,Medial Malleolus. The wound measures 0cm length x 0cm width x 0cm depth; 0cm^2 area and 0cm^3 volume. There is no tunneling or undermining noted. There is a none present amount of drainage noted. The wound margin is distinct with the outline attached to the wound base. There is no granulation within the wound bed. There is no necrotic tissue within the wound bed. Assessment Active Problems ICD-10 Type 2 diabetes mellitus with foot ulcer Lymphedema, not elsewhere classified Non-pressure chronic ulcer of right heel and midfoot with fat layer exposed Non-pressure chronic ulcer of other part of right lower leg with fat layer exposed Non-pressure chronic ulcer of skin of other sites with fat layer exposed Chronic combined systolic (congestive) and diastolic (congestive) heart failure Essential (primary) hypertension Procedures Wound #1 Pre-procedure diagnosis of Wound #1 is a Diabetic Wound/Ulcer of the Lower Extremity located on the Right Calcaneus .  There was a Four Layer Compression Therapy Procedure by Deon Pilling, RN. Post procedure Diagnosis Wound #1: Same as Pre-Procedure Plan Follow-up Appointments: Return Appointment in 1  week. Skin Barriers/Peri-Wound Care: TCA Cream or Ointment Wound Cleansing: Wound #1 Right Calcaneus: May shower with protection. Wound #4 Right,Medial Abdomen - Lower Quadrant: May shower and wash wound with soap and water. Primary Wound Dressing: Wound #1 Right Calcaneus: Calcium Alginate with Silver Wound #4 Right,Medial Abdomen - Lower Quadrant: Hydrofera Blue Secondary Dressing: Wound #1 Right Calcaneus: Dry Gauze Heel Cup Wound #4 Right,Medial Abdomen - Lower Quadrant: Dry Gauze - or ABD pad Edema Control: 4 layer compression - Right Lower Extremity Avoid standing for long periods of time Elevate legs to the level of the heart or above for 30 minutes daily and/or when sitting, a frequency of: - whenever sitting Exercise regularly Support Garment 20-30 mm/Hg pressure to: - wear compression stocking to left lower leg. moisturize leg with lotion at night after stocking removed. Apply stocking first thing in the morning and remove before bed. Off-Loading: Open toe surgical shoe to: - to both feet. 1. Continue with silver alginate to the right heel under compression 2. Hydrofera Blue to the areas under the abdomen with foam cover. 3. I wonder if this patient has chronic liver disease. Electronic Signature(s) Signed: 08/22/2019 6:34:01 PM By: Linton Ham MD Entered By: Linton Ham on 08/22/2019 08:25:33 -------------------------------------------------------------------------------- SuperBill Details Patient Name: Date of Service: Groh, [place] M. 08/22/2019 Medical Record [id number] Patient Account Number: [id number] Date of Birth/Sex: Treating RN: 22-Jun-1961 (58 y.o. 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

## 2019-08-25 NOTE — Progress Notes (Signed)
[name], [name] ([id number]) Visit Report for 08/22/2019 Arrival Information Details Patient Name: Date of Service: [name], [name]. 08/22/2019 8:00 AM Medical Record [id number] Patient Account Number: [id number] Date of Birth/Sex: Treating RN: 1961-08-09 (58 y.o. [name] Primary Care [name]: [name] Other Clinician: Referring [name]: Treating [name]/Extender:[name], [name], [name] in Treatment: 4 Visit Information History Since Last Visit Added or deleted any medications: Yes Patient Arrived: Walker Any new allergies or adverse reactions: No Arrival Time: 07:49 Had a fall or experienced change in No Accompanied By: husband activities of daily living that may affect Transfer Assistance: None risk of falls: Patient Identification Verified: Yes Signs or symptoms of abuse/neglect since last No Secondary Verification Process Yes visito Completed: Hospitalized since last visit: No Patient Requires Transmission- No Implantable device outside of the clinic excluding No Based Precautions: cellular tissue based products placed in the center Patient Has Alerts: Yes since last visit: Patient Alerts: Patient on Blood Has Dressing in Place as Prescribed: Yes Thinner Has Compression in Place as Prescribed: Yes Pain Present Now: No Electronic Signature(s) Signed: 08/25/2019 6:02:21 PM By: [name] Hurst RN, BSN Entered By: [name] on 08/22/2019 07:49:50 -------------------------------------------------------------------------------- Compression Therapy Details Patient Name: Date of Service: [name], [name] 08/22/2019 8:00 AM Medical Record [id number] Patient Account Number: [id number] Date of Birth/Sex: Treating RN: [birth year] (58 y.o. [name] Primary Care Khiana Camino: [name] Other Clinician: Referring [name]: Treating [name]/Extender:[name], [name], [name] in  Treatment: 4 Compression Therapy Performed for Wound Wound #1 Right Calcaneus Assessment: Performed By: Clinician [name] Pilling, RN Compression Type: Four Layer Post Procedure Diagnosis Same as Pre-procedure Electronic Signature(s) Signed: 08/25/2019 5:56:42 PM By: [name] Entered By: [name] on 08/22/2019 08:19:11 -------------------------------------------------------------------------------- Encounter Discharge Information Details Patient Name: Date of Service: [name], [name] 08/22/2019 8:00 AM Medical Record [id number] Patient Account Number: [id number] Date of Birth/Sex: Treating RN: Apr 03, 1961 (58 y.o. [name] Primary Care Tashyra Adduci: [name] Other Clinician: Referring [name]: Treating [name]/Extender:[name], [name], [name] in Treatment: 4 Encounter Discharge Information Items Discharge Condition: Stable Ambulatory Status: Walker Discharge Destination: Home Transportation: Private Auto Accompanied By: husband Schedule Follow-up Appointment: Yes Clinical Summary of Care: Notes demonstrated how to apply compression stocking to left leg. Electronic Signature(s) Signed: 08/22/2019 6:25:32 PM By: [name] Entered By: [name] on 08/22/2019 08:39:05 -------------------------------------------------------------------------------- Lower Extremity Assessment Details Patient Name: Date of Service: [name], [name] 08/22/2019 8:00 AM Medical Record [id number] Patient Account Number: [id number] Date of Birth/Sex: Treating RN: Sep 14, 1960 (58 y.o. [name] Primary Care Marieann Zipp: [name] Other Clinician: Referring Shaneal Barasch: Treating [name]/Extender:[name], [name], [name] in Treatment: 4 Edema Assessment Assessed: [Left: No] [Right: No] Edema: [Left: Yes] [Right: Yes] Calf Left: Right: Point of Measurement: 35 cm From Medial Instep 41 cm 39.8  cm Ankle Left: Right: Point of Measurement: 10 cm From Medial Instep 21.4 cm 21.3 cm Vascular Assessment Pulses: Dorsalis Pedis Palpable: [Left:Yes] [Right:Yes] Electronic Signature(s) Signed: 08/25/2019 6:02:21 PM By: [name] Hurst RN, BSN Entered By: [name] on 08/22/2019 07:57:01 -------------------------------------------------------------------------------- Multi Wound Chart Details Patient Name: Date of Service: [name], [name] 08/22/2019 8:00 AM Medical Record [id number] Patient Account Number: [id number] Date of Birth/Sex: Treating RN: June 11, 1961 (58 y.o. F) Primary Care [name]: [name] Other Clinician: Referring [name]: Treating [name]/Extender:[name], [name], [name] in Treatment: 4 Vital Signs Height(in): 62 Capillary Blood 160 Glucose(mg/dl): Weight(lbs): 300 Pulse(bpm): 94 Body Mass Index(BMI): 55 Blood Pressure(mmHg): 176/77 Temperature(F): 97.6 Respiratory 22 Rate(breaths/min): Photos: [1:No Photos] [2R:No Photos] [  3:No Photos] Wound Location: [1:Right Calcaneus] [2R:Right Lower Leg - Medial] [3:Abdomen - midline - Proximal] Wounding Event: [1:Blister] [2R:Blister] [3:Gradually Appeared] 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] [3:Atypical] Comorbid History: [1:Cataracts, Lymphedema, Cataracts, Lymphedema, Asthma, Congestive Heart Asthma, Congestive Heart Failure, Coronary Artery Disease, Hypertension, Type II Diabetes, Rheumatoid Arthritis, Neuropathy] [2R:Failure, Coronary Artery Disease,  Hypertension, Type II Diabetes, Rheumatoid Arthritis, Neuropathy] [3:Cataracts, Lymphedema, Asthma, Congestive Heart Failure, Coronary Artery Disease, Hypertension, Type II Diabetes, Rheumatoid Arthritis, Neuropathy] Date Acquired: [1:09/11/2016] [2R:07/21/2019] [3:05/13/2019] Weeks of Treatment: [1:4] [2R:4] [3:4] Wound Status: [1:Open] [2R:Healed - Epithelialized] [3:Healed -  Epithelialized] Wound Recurrence: [1:No] [2R:Yes] [3:No] Measurements L x W x D [1:0.2x0.2x0.2] [2R:0x0x0] [3:0x0x0] (cm) Area (cm) : [1:0.031] [2R:0] [3:0] Volume (cm) : [1:0.006] [2R:0] [3:0] % Reduction in Area: [1:98.60%] [2R:100.00%] [3:100.00%] % Reduction in Volume: [1:98.70%] [2R:100.00%] [3:100.00%] Classification: [1:Grade 2] [2R:Full Thickness Without Exposed Support Structures Exposed Support Structures] [3:Full Thickness Without] Exudate Amount: [1:Small] [2R:None Present] [3:None Present] Exudate Type: [1:Serous] [2R:N/A] [3:N/A] Exudate Color: [1:amber] [2R:N/A] [3:N/A] Wound Margin: [1:Distinct, outline attached] [2R:Flat and Intact] [3:Flat and Intact] Granulation Amount: [1:Large (67-100%)] [2R:None Present (0%)] [3:None Present (0%)] Granulation Quality: [1:Pink, Pale] [2R:N/A] [3:N/A] Necrotic Amount: [1:None Present (0%)] [2R:None Present (0%)] [3:None Present (0%)] Necrotic Tissue: [1:N/A] [2R:N/A] [3:N/A] Exposed Structures: [1:Fat Layer (Subcutaneous Tissue) Exposed: Yes Fascia: No Tendon: No Muscle: No Joint: No Bone: No] [2R:Fascia: No Fat Layer (Subcutaneous Fat Layer (Subcutaneous Tissue) Exposed: No Tendon: No Muscle: No Joint: No Bone: No] [3:Fascia: No Tissue)  Exposed: No Tendon: No Muscle: No Joint: No Bone: No] Epithelialization: [1:Small (1-33%) 4] [2R:Large (67-100%) 6] [3:Large (67-100%) 7] Photos: [1:No Photos] [2R:No Photos] [3:No Photos] Wound Location: [1:Right Abdomen - Lower Quadrant - Medial] [2R:Abdomen - midline - Distal Right Malleolus - Medial] Wounding Event: [1:Gradually Appeared] [2R:Gradually Appeared] [3:Gradually Appeared] Primary Etiology: [1:Atypical] [2R:Atypical] [3:Lymphedema] Comorbid History: [1:Cataracts, Lymphedema, Cataracts, Lymphedema, Cataracts, Lymphedema, Asthma, Congestive Heart Asthma, Congestive Heart Asthma, Congestive Heart Failure, Coronary Artery Disease, Hypertension, Type II Diabetes, Rheumatoid Arthritis,   Neuropathy] [2R:Failure, Coronary Artery Disease, Hypertension, Type II Diabetes, Rheumatoid Arthritis, Neuropathy] [3:Failure, Coronary Artery Disease, Hypertension, Type II Diabetes, Rheumatoid Arthritis, Neuropathy] Date Acquired: [1:05/13/2019] [2R:05/13/2019] [3:07/31/2019] Weeks of Treatment: [1:4] [2R:4] [3:3] Wound Status: [1:Open] [2R:Healed - Epithelialized] [3:Healed - Epithelialized] Wound Recurrence: [1:No] [2R:No] [3:No] Measurements L x W x D 0.8x0.7x0.1 [2R:0x0x0] [3:0x0x0] (cm) Area (cm) : [1:0.44] [2R:0] [3:0] Volume (cm) : [1:0.044] [2R:0] [3:0] % Reduction in Area: [1:76.70%] [2R:100.00%] [3:100.00%] % Reduction in Volume: 76.60% [2R:100.00%] [3:100.00%] Classification: [1:Full Thickness Without Exposed Support Structures Exposed Support Structures Exposed Support Structures] [2R:Full Thickness Without] [3:Full Thickness Without] Exudate Amount: [1:Medium] [2R:None Present] [3:None Present] Exudate Type: [1:Serosanguineous] [2R:N/A] [3:N/A] Exudate Color: [1:red, brown] [2R:N/A] [3:N/A] Wound Margin: [1:Flat and Intact] [2R:Flat and Intact] [3:Distinct, outline attached] Granulation Amount: [1:Large (67-100%)] [2R:None Present (0%)] [3:None Present (0%)] Granulation Quality: [1:Pink] [2R:N/A] [3:N/A] Necrotic Amount: [1:Small (1-33%)] [2R:None Present (0%)] [3:None Present (0%)] Necrotic Tissue: [1:Eschar] [2R:N/A] [3:N/A] Exposed Structures: [1:Fat Layer (Subcutaneous Tissue) Exposed: Yes Fascia: No Tendon: No Muscle: No Joint: No Bone: No Medium (34-66%)] [2R:Fascia: No Fat Layer (Subcutaneous Tissue) Exposed: No Tendon: No Muscle: No Joint: No Bone: No Large (67-100%)] [3:Fascia: No Fat  Layer (Subcutaneous Tissue) Exposed: No Tendon: No Muscle: No Joint: No Bone: No Large (67-100%)] Treatment Notes Electronic Signature(s) Signed: 08/22/2019 6:34:01 PM By: Linton Ham MD Entered By: Linton Ham on 08/22/2019  08:17:56 -------------------------------------------------------------------------------- Multi-Disciplinary Care Plan Details Patient Name: Date of Service: [name], [name]  08/22/2019 8:00 AM Medical Record [id number] Patient Account Number: [id number] Date of Birth/Sex: Treating RN: October 04, 1960 (58 y.o. [name] Primary Care Raechelle Sarti: [name] Other Clinician: Referring Mycheal Veldhuizen: Treating Aubriauna Riner/Extender:[name], [name], [name] in Treatment: 4 Active Inactive Pressure Nursing Diagnoses: Knowledge deficit related to causes and risk factors for pressure ulcer development Potential for impaired tissue integrity related to pressure, friction, moisture, and shear Goals: Patient/caregiver will verbalize understanding of pressure ulcer management Date Initiated: 07/23/2019 Target Resolution Date: 09/19/2019 Goal Status: Active Interventions: Assess offloading mechanisms upon admission and as needed Notes: Venous Leg Ulcer Nursing Diagnoses: Knowledge deficit related to disease process and management Potential for venous Insuffiency (use before diagnosis confirmed) Goals: Patient will maintain optimal edema control Date Initiated: 07/23/2019 Target Resolution Date: 09/19/2019 Goal Status: Active Interventions: Assess peripheral edema status every visit. Compression as ordered Treatment Activities: Therapeutic compression applied : 07/23/2019 Notes: Wound/Skin Impairment Nursing Diagnoses: Impaired tissue integrity Knowledge deficit related to ulceration/compromised skin integrity Goals: Patient/caregiver will verbalize understanding of skin care regimen Date Initiated: 07/23/2019 Date Inactivated: 08/14/2019 Target Resolution Date: 08/20/2019 Goal Status: Met Ulcer/skin breakdown will have a volume reduction of 30% by week 4 Date Initiated: 07/23/2019 Target Resolution Date: 09/19/2019 Goal Status: Active Interventions: Assess  patient/caregiver ability to obtain necessary supplies Assess patient/caregiver ability to perform ulcer/skin care regimen upon admission and as needed Assess ulceration(s) every visit Treatment Activities: Skin care regimen initiated : 07/23/2019 Topical wound management initiated : 07/23/2019 Notes: Electronic Signature(s) Signed: 08/25/2019 5:56:42 PM By: [name] Entered By: [name] on 08/22/2019 07:52:33 -------------------------------------------------------------------------------- Pain Assessment Details Patient Name: Date of Service: [name], [name] 08/22/2019 8:00 AM Medical Record [id number] Patient Account Number: [id number] Date of Birth/Sex: Treating RN: 03-08-1961 (58 y.o. [name] Primary Care Aaniyah Strohm: [name] Other Clinician: Referring Tajae Rybicki: Treating Dreyden Rohrman/Extender:[name], [name], [name] in Treatment: 4 Active Problems Location of Pain Severity and Description of Pain Patient Has Paino No Site Locations Pain Management and Medication Current Pain Management: Electronic Signature(s) Signed: 08/25/2019 6:02:21 PM By: [name] Hurst RN, BSN Entered By: [name] on 08/22/2019 07:50:03 -------------------------------------------------------------------------------- Patient/Caregiver Education Details Patient Name: Date of Service: Moquin, Nashayla M. 12/11/2020andnbsp8:00 AM Medical Record Patient Account Number: [id number] Number: Treating RN: [name] Date of Birth/Gender: 26-Mar-1961 (58 y.o. F) Other Clinician: Primary Care Physician: Marylin Crosby Referring Physician: Physician/Extender: Tonny Bollman in Treatment: 4 Education Assessment Education Provided To: Patient Education Topics Provided Wound/Skin Impairment: Methods: Explain/Verbal Responses: State content correctly Electronic Signature(s) Signed:  08/25/2019 5:56:42 PM By: [name] Entered By: [name] on 08/22/2019 08:24:02 -------------------------------------------------------------------------------- Wound Assessment Details Patient Name: Date of Service: [name], [name] 08/22/2019 8:00 AM Medical Record [id number] Patient Account Number: [id number] Date of Birth/Sex: Treating RN: 04/29/1961 (58 y.o. [name] Primary Care Piera Downs: [name] Other Clinician: Referring Roselene Gray: Treating Toni Hoffmeister/Extender:[name], [name], [name] in Treatment: 4 Wound Status Wound Number: 1 Primary Diabetic Wound/Ulcer of the Lower Extremity Etiology: Wound Location: Right Calcaneus Wound Open Wounding Event: Blister Status: Date Acquired: 09/11/2016 Comorbid Cataracts, Lymphedema, Asthma, Congestive Weeks Of Treatment: 4 History: Heart Failure, Coronary Artery Disease, Clustered Wound: No Hypertension, Type II Diabetes, Rheumatoid Arthritis, Neuropathy Photos Wound Measurements Length: (cm) 0.2 % Reduction i Width: (cm) 0.2 % Reduction i Depth: (cm) 0.2 Epithelializa Area: (cm) 0.031 Tunneling: Volume: (cm) 0.006 Undermining: Wound Description Classification: Grade 2 Foul Odor Aft Wound Margin: Distinct, outline attached Slough/Fibrin Exudate Amount: Small Exudate Type: Serous Exudate Color: amber Wound Bed  Granulation Amount: Large (67-100%) Granulation Quality: Pink, Pale Fascia Expose Necrotic Amount: None Present (0%) Fat Layer (Su Tendon Expose Muscle Expose Joint Exposed Bone Exposed: er Cleansing: No o Yes Exposed Structure d: No bcutaneous Tissue) Exposed: Yes d: No d: No : No No n Area: 98.6% n Volume: 98.7% tion: Small (1-33%) No No Treatment Notes Wound #1 (Right Calcaneus) 1. Cleanse With Wound Cleanser Soap and water 2. Periwound Care TCA Cream 3. Primary Dressing Applied Calcium Alginate Ag 4. Secondary Dressing Dry  Gauze Heel Cup 6. Support Layer Applied 4 layer compression wrap Notes netting. Electronic Signature(s) Signed: 08/25/2019 3:44:56 PM By: Mikeal Hawthorne EMT/HBOT Signed: 08/25/2019 6:02:21 PM By: [name] Hurst RN, BSN Entered By: Mikeal Hawthorne on 08/25/2019 14:55:32 -------------------------------------------------------------------------------- Wound Assessment Details Patient Name: Date of Service: [name], [name] 08/22/2019 8:00 AM Medical Record [id number] Patient Account Number: [id number] Date of Birth/Sex: Treating RN: Aug 11, 1961 (58 y.o. [name] Primary Care Jolee Critcher: [name] Other Clinician: Referring Tonjia Parillo: Treating Phyliss Hulick/Extender:[name], [name], [name] in Treatment: 4 Wound Status Wound Number: 2R Primary Lymphedema Etiology: Wound Location: Right Lower Leg - Medial Wound Healed - Epithelialized Wounding Event: Blister Status: Date Acquired: 07/21/2019 Comorbid Cataracts, Lymphedema, Asthma, Congestive Weeks Of Treatment: 4 History: Heart Failure, Coronary Artery Disease, Clustered Wound: No Hypertension, Type II Diabetes, Rheumatoid Arthritis, Neuropathy Photos Wound Measurements Length: (cm) 0 % Reduction Width: (cm) 0 % Reduct Depth: (cm) 0 Epitheli Area: (cm) 0 Tunneli Volume: (cm) 0 Undermi Wound Description Classification: Full Thickness Without Exposed Support Foul Odo Structures Slough/F Wound Flat and Intact Margin: Exudate None Present Amount: Wound Bed Granulation Amount: None Present (0%) Necrotic Amount: None Present (0%) Fascia E Fat Laye Tendon E Muscle E Joint Ex Bone Exp r After Cleansing: No ibrino No Exposed Structure xposed: No r (Subcutaneous Tissue) Exposed: No xposed: No xposed: No posed: No osed: No in Area: 100% ion in Volume: 100% alization: Large (67-100%) ng: No ning: No Electronic Signature(s) Signed: 08/25/2019 3:44:56 PM By: Mikeal Hawthorne  EMT/HBOT Signed: 08/25/2019 6:02:21 PM By: [name] Hurst RN, BSN Entered By: Mikeal Hawthorne on 08/25/2019 14:31:54 -------------------------------------------------------------------------------- Wound Assessment Details Patient Name: Date of Service: [name], [name] 08/22/2019 8:00 AM Medical Record [id number] Patient Account Number: [id number] Date of Birth/Sex: Treating RN: [birth year] (58 y.o. [name] Primary Care Emalia Witkop: [name] Other Clinician: Referring Anel Purohit: Treating Ileane Sando/Extender:[name], [name], [name] in Treatment: 4 Wound Status Wound Number: 3 Primary Atypical Etiology: Wound Location: Abdomen - midline - Proximal Wound Healed - Epithelialized Wounding Event: Gradually Appeared Status: Date Acquired: 05/13/2019 Comorbid Cataracts, Lymphedema, Asthma, Congestive Weeks Of Treatment: 4 History: Heart Failure, Coronary Artery Disease, Clustered Wound: No Hypertension, Type II Diabetes, Rheumatoid Arthritis, Neuropathy Photos Wound Measurements Length: (cm) 0 % Reduct Width: (cm) 0 % Reduct Depth: (cm) 0 Epitheli Area: (cm) 0 Volume: (cm) 0 Wound Description Classification: Full Thickness Without Exposed Support Foul Odo Structures Slough/F Wound Flat and Intact Margin: Exudate None Present Amount: Wound Bed Granulation Amount: None Present (0%) Necrotic Amount: None Present (0%) Fascia E Fat Laye Tendon E Muscle E Joint Ex Bone Exp r After Cleansing: No ibrino No Exposed Structure xposed: No r (Subcutaneous Tissue) Exposed: No xposed: No xposed: No posed: No osed: No ion in Area: 100% ion in Volume: 100% alization: Large (67-100%) Electronic Signature(s) Signed: 08/25/2019 3:44:56 PM By: Mikeal Hawthorne EMT/HBOT Signed: 08/25/2019 6:02:21 PM By: [name] Hurst RN, BSN Entered By: Mikeal Hawthorne on 08/25/2019  14:31:26 -------------------------------------------------------------------------------- Wound Assessment Details Patient  Name: Date of Service: [name], [name] 08/22/2019 8:00 AM Medical Record [id number] Patient Account Number: [id number] Date of Birth/Sex: Treating RN: [birth year] (58 y.o. [name] Primary Care Quynn Vilchis: [name] Other Clinician: Referring Terease Marcotte: Treating Amorina Doerr/Extender:[name], [name], [name] in Treatment: 4 Wound Status Wound Number: 4 Primary Atypical Right Abdomen - Lower Quadrant - Etiology: Wound Location: Medial Wound Open Status: Status: Wounding Event: Gradually Appeared Comorbid Cataracts, Lymphedema, Asthma, Congestive Date Acquired: 05/13/2019 History: Heart Failure, Coronary Artery Disease, Weeks Of Treatment: 4 Hypertension, Type II Diabetes, Rheumatoid Clustered Wound: No Arthritis, Neuropathy Photos Wound Measurements Length: (cm) 0.8 % Reduct Width: (cm) 0.7 % Reduct Depth: (cm) 0.1 Epitheli Area: (cm) 0.44 Tunneli Volume: (cm) 0.044 Undermi Wound Description Full Thickness Without Exposed Support Foul Odo Classification: Structures Slough/F Wound Flat and Intact Margin: Exudate Medium Amount: Exudate Serosanguineous Type: Exudate red, brown Color: Wound Bed Granulation Amount: Large (67-100%) Granulation Quality: Pink Fascia E Necrotic Amount: Small (1-33%) Fat Laye Necrotic Quality: Eschar Tendon E Muscle E Joint Ex Bone Exp r After Cleansing: No ibrino Yes Exposed Structure xposed: No r (Subcutaneous Tissue) Exposed: Yes xposed: No xposed: No posed: No osed: No ion in Area: 76.7% ion in Volume: 76.6% alization: Medium (34-66%) ng: No ning: No Treatment Notes Wound #4 (Right, Medial Abdomen - Lower Quadrant) 1. Cleanse With Wound Cleanser 2. Periwound Care Skin Prep 3. Primary Dressing Applied Hydrofera Blue 4. Secondary Dressing Foam Border  Dressing 5. Secured With Office manager) Signed: 08/25/2019 3:44:56 PM By: Mikeal Hawthorne EMT/HBOT Signed: 08/25/2019 6:02:21 PM By: [name] Hurst RN, BSN Entered By: Mikeal Hawthorne on 08/25/2019 14:30:35 -------------------------------------------------------------------------------- Wound Assessment Details Patient Name: Date of Service: [name], [name] 08/22/2019 8:00 AM Medical Record [id number] Patient Account Number: [id number] Date of Birth/Sex: Treating RN: [birth year] (58 y.o. [name] Primary Care Lynasia Meloche: [name] Other Clinician: Referring Avis Tirone: Treating Lalaine Overstreet/Extender:[name], [name], [name] in Treatment: 4 Wound Status Wound Number: 6 Primary Atypical Etiology: Wound Location: Abdomen - midline - Distal Wound Healed - Epithelialized Wounding Event: Gradually Appeared Status: Date Acquired: 05/13/2019 Comorbid Cataracts, Lymphedema, Asthma, Congestive Weeks Of Treatment: 4 History: Heart Failure, Coronary Artery Disease, Clustered Wound: No Hypertension, Type II Diabetes, Rheumatoid Arthritis, Neuropathy Photos Wound Measurements Length: (cm) 0 % Reduct Width: (cm) 0 % Reduct Depth: (cm) 0 Epitheli Area: (cm) 0 Tunneli Volume: (cm) 0 Undermi Wound Description Classification: Full Thickness Without Exposed Support Foul Odo Structures Slough/F Wound Flat and Intact Margin: Exudate None Present Amount: Wound Bed Granulation Amount: None Present (0%) Necrotic Amount: None Present (0%) Fascia E Fat Laye Tendon E Muscle E Joint Ex Bone Exp r After Cleansing: No ibrino No Exposed Structure xposed: No r (Subcutaneous Tissue) Exposed: No xposed: No xposed: No posed: No osed: No ion in Area: 100% ion in Volume: 100% alization: Large (67-100%) ng: No ning: No Electronic Signature(s) Signed: 08/25/2019 3:44:56 PM By: Mikeal Hawthorne EMT/HBOT Signed: 08/25/2019  6:02:21 PM By: [name] Hurst RN, BSN Entered By: Mikeal Hawthorne on 08/25/2019 14:31:01 -------------------------------------------------------------------------------- Wound Assessment Details Patient Name: Date of Service: Coletti, Monicka M. 08/22/2019 8:00 AM Medical Record [id number] Patient Account Number: [id number] Date of Birth/Sex: Treating RN: 03-26-1961 (58 y.o. [name] Primary Care Braxtin Bamba: [name] Other Clinician: Referring Elea Holtzclaw: Treating Ossie Beltran/Extender:[name], [name], [name] in Treatment: 4 Wound Status Wound Number: 7 Primary Lymphedema Etiology: Wound Location: Right Malleolus - Medial Wound Healed - Epithelialized Wounding Event: Gradually Appeared Status: Date Acquired: 07/31/2019 Comorbid Cataracts, Lymphedema,  Asthma, Congestive Weeks Of Treatment: 3 History: Heart Failure, Coronary Artery Disease, Clustered Wound: No Hypertension, Type II Diabetes, Rheumatoid Arthritis, Neuropathy Photos Wound Measurements Length: (cm) 0 % Reduct Width: (cm) 0 % Reduct Depth: (cm) 0 Epitheli Area: (cm) 0 Tunneli Volume: (cm) 0 Undermi Wound Description Classification: Full Thickness Without Exposed Support Foul Odo Structures Slough/F Wound Distinct, outline attached Distinct, outline attached Margin: Exudate None Present Amount: Wound Bed Granulation Amount: None Present (0%) Necrotic Amount: None Present (0%) Fascia E Fat Laye Tendon E Muscle E Joint Ex Bone Exp r After Cleansing: No ibrino No Exposed Structure xposed: No r (Subcutaneous Tissue) Exposed: No xposed: No xposed: No posed: No osed: No ion in Area: 100% ion in Volume: 100% alization: Large (67-100%) ng: No ning: No Electronic Signature(s) Signed: 08/25/2019 3:44:56 PM By: Mikeal Hawthorne EMT/HBOT Signed: 08/25/2019 6:02:21 PM By: [name] Hurst RN, BSN Entered By: Mikeal Hawthorne on 08/25/2019  14:55:12 -------------------------------------------------------------------------------- Vitals Details Patient Name: Date of Service: Zackery, Laurita M. 08/22/2019 8:00 AM Medical Record [id number] Patient Account Number: [id number] Date of Birth/Sex: Treating RN: 14-May-1961 (58 y.o. [name] Primary Care Khylee Algeo: [name] Other Clinician: Referring Trinidy Masterson: Treating Rainie Crenshaw/Extender:[name], [name], [name] in Treatment: 4 Vital Signs Time Taken: 07:50 Temperature (F): 97.6 Height (in): 62 Pulse (bpm): 94 Weight (lbs): 300 Respiratory Rate (breaths/min): 22 Body Mass Index (BMI): 54.9 Blood Pressure (mmHg): 176/77 Capillary Blood Glucose (mg/dl): 160 Reference Range: 80 - 120 mg / dl Notes glucose per pt report Electronic Signature(s) Signed: 08/25/2019 6:02:21 PM By: [name] Hurst RN, BSN Entered By: [name] on 08/22/2019 07:51:42

## 2019-08-25 NOTE — Telephone Encounter (Signed)
Pt is aware to contact PCP.  

## 2019-08-25 NOTE — Telephone Encounter (Signed)
-----   Message from Nuala Alpha, LPN sent at 06/27/5101  1:37 PM EST -----  ----- Message ----- From: Sueanne Margarita, MD Sent: 08/21/2019   3:18 PM EST To: Shelda Pal, DO, #  Please let patient know that stress test was fine. Please find out how she is doing. If doing ok can move followup appt out 6 months

## 2019-08-25 NOTE — Telephone Encounter (Signed)
I called and spoke with patient about stress test results. She is aware of results and states that she is feeling a lot better. She has had 2 rounds of steroids and she is on another medication for her asthma. She believes her symptoms were from her asthma. I have cancelled her 08/28/19 appointment and put in a recall for 6 months. Patient wants to know if she can increase her Protonix to twice a day? She is still having some heartburn and wants to increase medication.

## 2019-08-25 NOTE — Telephone Encounter (Signed)
Recommend talking to her PCP about the Protonix dose

## 2019-08-28 ENCOUNTER — Other Ambulatory Visit: Payer: Self-pay | Admitting: Family Medicine

## 2019-08-28 ENCOUNTER — Telehealth: Payer: BC Managed Care – PPO | Admitting: Cardiology

## 2019-08-28 DIAGNOSIS — J9601 Acute respiratory failure with hypoxia: Secondary | ICD-10-CM

## 2019-08-28 DIAGNOSIS — J45909 Unspecified asthma, uncomplicated: Secondary | ICD-10-CM

## 2019-08-28 NOTE — Telephone Encounter (Signed)
Last OV 08/15/19 Last refill Promethazine 06/19/19 #30/0                 Breo 06/06/19 #60/0                 Mupirocin 07/03/19 # 22 g / 0 Next OV 11/14/19

## 2019-08-29 ENCOUNTER — Encounter: Payer: Self-pay | Admitting: Family Medicine

## 2019-08-29 ENCOUNTER — Encounter (HOSPITAL_BASED_OUTPATIENT_CLINIC_OR_DEPARTMENT_OTHER): Payer: BC Managed Care – PPO | Admitting: Internal Medicine

## 2019-09-02 ENCOUNTER — Encounter (HOSPITAL_BASED_OUTPATIENT_CLINIC_OR_DEPARTMENT_OTHER): Payer: BC Managed Care – PPO | Admitting: Internal Medicine

## 2019-09-02 ENCOUNTER — Other Ambulatory Visit: Payer: Self-pay

## 2019-09-02 DIAGNOSIS — E11621 Type 2 diabetes mellitus with foot ulcer: Secondary | ICD-10-CM | POA: Diagnosis not present

## 2019-09-03 ENCOUNTER — Other Ambulatory Visit: Payer: Self-pay | Admitting: Family Medicine

## 2019-09-03 ENCOUNTER — Encounter: Payer: Self-pay | Admitting: Family Medicine

## 2019-09-03 DIAGNOSIS — R600 Localized edema: Secondary | ICD-10-CM

## 2019-09-03 MED ORDER — POTASSIUM CHLORIDE CRYS ER 10 MEQ PO TBCR: EXTENDED_RELEASE_TABLET | ORAL | 2 refills | Status: DC

## 2019-09-03 MED ORDER — FUROSEMIDE 40 MG PO TABS: 60.0000 mg | ORAL_TABLET | Freq: Two times a day (BID) | ORAL | 0 refills | Status: DC

## 2019-09-03 NOTE — Progress Notes (Signed)
[name], [name] BRIERLEY ([id number]) Visit Report for 09/02/2019 Debridement Details Patient Name: Date of Service: [name], [name] BENE. 09/02/2019 8:00 AM Medical Record [id number] Patient Account Number: [id number] Date of Birth/Sex: 1960/10/24 (58 y.o. F) Treating RN: Primary Care Provider: [name] Other Clinician: Referring Provider: Treating Provider/Extender:Robson, Arlis Porta, Archie Patten in Treatment: 5 Debridement Performed for Wound #1 Right Calcaneus Assessment: Performed By: Physician Ricard Dillon., MD Debridement Type: Debridement Severity of Tissue Pre Fat layer exposed Debridement: Level of Consciousness (Pre- Awake and Alert procedure): Pre-procedure Verification/Time Out Taken: Yes - 08:23 Start Time: 08:23 Pain Control: Other : benzoczine 20% Total Area Debrided (L x W): 0.2 (cm) x 0.2 (cm) = 0.04 (cm) Tissue and other material Non-Viable, Slough, Subcutaneous, Slough debrided: Level: Skin/Subcutaneous Tissue Debridement Description: Excisional Instrument: Curette Bleeding: Moderate Hemostasis Achieved: Pressure End Time: 08:25 Procedural Pain: 2 Post Procedural Pain: 0 Response to Treatment: Procedure was tolerated well Level of Consciousness Awake and Alert (Post-procedure): Post Debridement Measurements of Total Wound Length: (cm) 1.5 Width: (cm) 0.7 Depth: (cm) 0.2 Volume: (cm) 0.165 Character of Wound/Ulcer Post Improved Debridement: Severity of Tissue Post Debridement: Fat layer exposed Post Procedure Diagnosis Same as Pre-procedure Electronic Signature(s) Signed: 09/02/2019 6:32:44 PM By: Linton Ham MD Entered By: Linton Ham on 09/02/2019 08:42:00 -------------------------------------------------------------------------------- Debridement Details Patient Name: Date of Service: [name], [name] 09/02/2019 8:00 AM Medical Record [id number] Patient Account Number: [id number] Date of  Birth/Sex: Jul 25, 1961 (58 y.o. F) Treating RN: Primary Care Provider: [name] Other Clinician: Referring Provider: Treating Provider/Extender:Robson, Arlis Porta, Archie Patten in Treatment: 5 Debridement Performed for Wound #5R Right,Lateral Abdomen - Lower Quadrant Assessment: Performed By: Physician Ricard Dillon., MD Debridement Type: Debridement Level of Consciousness (Pre- Awake and Alert procedure): Pre-procedure Verification/Time Out Taken: Yes - 08:23 Start Time: 08:23 Pain Control: Other : benzoczine 20% Total Area Debrided (L x W): 0.9 (cm) x 1.1 (cm) = 0.99 (cm) Tissue and other material Non-Viable, Slough, Subcutaneous, Slough debrided: Level: Skin/Subcutaneous Tissue Debridement Description: Excisional Instrument: Curette Bleeding: Moderate Hemostasis Achieved: Pressure End Time: 08:25 Procedural Pain: 2 Post Procedural Pain: 0 Response to Treatment: Procedure was tolerated well Level of Consciousness Awake and Alert (Post-procedure): Post Debridement Measurements of Total Wound Length: (cm) 0.9 Width: (cm) 1.1 Depth: (cm) 0.1 Volume: (cm) 0.078 Character of Wound/Ulcer Post Improved Debridement: Post Procedure Diagnosis Same as Pre-procedure Electronic Signature(s) Signed: 09/02/2019 6:32:44 PM By: Linton Ham MD Entered By: Linton Ham on 09/02/2019 08:42:11 -------------------------------------------------------------------------------- HPI Details Patient Name: Date of Service: [name], [name] 09/02/2019 8:00 AM Medical Record [id number] Patient Account Number: [id number] Date of Birth/Sex: Treating RN: [birth year] (58 y.o. F) Primary Care Provider: [name] Other Clinician: Referring Provider: Treating Provider/Extender:Robson, Arlis Porta, Archie Patten in Treatment: 5 History of Present Illness HPI Description: 07/23/2019 on evaluation today patient presents with a myriad of wounds  noted at multiple locations over her right heel, right lower extremity, and abdominal region. She also has bilateral lower extremity lymphedema which is quite significant as well. Incidentally she also has congestive heart failure and hypertension. She also is obese. With that being said I think all this is contributing as well to her lower extremity edema which is very much uncontrolled. It seems like its been at least several months since she is worn any compression according to what she tells me. With that being said I am not sure exactly when that would have been. She does have fibrotic changes in the lower extremity secondary to lymphedema worse on the left  than the right. She did show me pictures of wound she had on the anterior portion of her shin which was quite significant fortunately that has healed. These issues have been intermittent at this time. Again I think that with appropriate compression therapy she may actually be doing better than what we are seeing at this point but again I am not really sure that she is ever been extremely compliant with that. Fortunately there is no signs of active infection at this time. No fever chills noted. As far as the abdominal ulcer she initially had one wound that she thinks may have been a bug bite. Subsequently she was put a dressing on this and she states that the tape pulled skin off on other locations causing other wounds that have not healed that is been about 3 months. The wounds on her lower extremities have been intermittent over 3 years. This includes the heel. 11/19; this is a patient that I have not seen previously. She was admitted to our clinic last week with multiple wounds including several superficial circular areas on her abdomen predominantly right upper quadrant. Also 1 in her umbilicus. She has an area on her right lateral malleolus which was new today. She has bilateral lower extremity edema with very significant stasis  dermatitis on the left anterior tibial area. She has tightly adherent skin in her lower extremities probably secondary to cutaneous fibrosis in the area. We put her in compression last week. She comes in with the dressings reasonably saturated. 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 She has multiple areas on her arms from scratching. 12/3; the patient I saw for the first time 2 weeks ago. She has 3 small circular areas on her abdomen 2 in the right upper quadrant one on her umbilicus. We have been using silver alginate to this area. She also had an area on her right lateral malleolus and right heel and right medial malleolus. We have been using silver alginate here under compression. She does not have an open area on the left leg today. We are going to order her stockings for the left leg. She clearly has chronic lymphedema in these areas. X-ray of the foot from 10/20 did not show any fracture or dislocation. The heel wound was noted there was no evidence of osteomyelitis She complains of generalized pruritus. 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 She has longstanding lymphedema. Although she does not have known liver disease I looked at his CT scan of the abdomen from February of this year that showed hepatic steatosis 12/11; patient still has no open area on the left leg we transitioned her into a stocking she had. Her stockings from [place] are supposed to be arriving later today which will be the stockings of choice. The only wound remaining on the right is the right heel 2 small superficial open areas. Everything else is well on its way to healing in the right leg few small excoriations. She has 1 major area remaining on her abdomen the rest seem to be  healing 12/22 patient still has no open area on the left leg and she is still in a stocking. She had still 2 open areas on the tip of her right heel. These look like pressure related areas although the patient is not really certain how  this is happening. She has an open heeled shoe that we provided. Still has 1 open area on the right lateral abdomen The patient has complaints of generalized pruritus. She has multiple excoriated areas on her abdomen that of close over her arms or thighs which I think are from scratching. She tells me that she has never had a basic work-up for this which might include basic lab work, liver functions, kidney functions, thyroid etc. She might also benefit from a dermatologist Electronic Signature(s) Signed: 09/02/2019 6:32:44 PM By: Linton Ham MD Entered By: Linton Ham on 09/02/2019 08:44:35 -------------------------------------------------------------------------------- Physical Exam Details Patient Name: Date of Service: [name], [name] 09/02/2019 8:00 AM Medical Record [id number] Patient Account Number: [id number] Date of Birth/Sex: Treating RN: [birth year] (58 y.o. F) Primary Care Provider: [name] Other Clinician: Referring Provider: Treating Provider/Extender:Robson, Arlis Porta, Archie Patten in Treatment: 5 Constitutional Sitting or standing Blood Pressure is within target range for patient.. Pulse regular and within target range for patient.Marland Kitchen Respirations regular, non-labored and within target range.. Temperature is normal and within the target range for the patient.Marland Kitchen Appears in no distress. Eyes Conjunctivae clear. No discharge.no icterus. Respiratory work of breathing is normal. Cardiovascular Pedal pulses are palpable. Gastrointestinal (GI) Abdomen very distended no clear shifting dullness. Integumentary (Hair, Skin) Multiple superficial excoriations. Dilated abdominal veins I  believe. Psychiatric appears at normal baseline. Notes Wound examhe has 2 open areas on the right heel. Surrounding callus which I removed with #3 curette. There did not look like there was any active infection here. The tissue underneath looked healthy. Hemostasis with direct pressure Several healed areas on her abdomen which were all circular excoriations. We still have one on the right lateral abdomen. Debridement of debris off the surface of this hemostasis with direct pressure Electronic Signature(s) Signed: 09/02/2019 6:32:44 PM By: Linton Ham MD Entered By: Linton Ham on 09/02/2019 08:48:59 -------------------------------------------------------------------------------- Physician Orders Details Patient Name: Date of Service: [name], [name] 09/02/2019 8:00 AM Medical Record [id number] Patient Account Number: [id number] Date of Birth/Sex: Treating RN: [birth year] (58 y.o. 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 Skin Barriers/Peri-Wound Care TCA Cream or Ointment Wound Cleansing May shower with protection. Primary Wound Dressing Wound #1 Right Calcaneus Calcium Alginate with Silver Wound #4 Right,Medial Abdomen - Lower  Quadrant Hydrofera Blue Wound #5R Right,Lateral Abdomen - Lower Quadrant Hydrofera Blue Secondary Dressing Wound #1 Right Calcaneus Dry Gauze Heel Cup Wound #4 Right,Medial Abdomen - Lower Quadrant Dry Gauze - or ABD pad Wound #5R Right,Lateral Abdomen - Lower Quadrant Dry Gauze - or ABD pad Edema Control 4 layer compression - Right Lower Extremity Avoid standing for long periods of time Elevate legs to the level of the heart or above for 30 minutes daily and/or when sitting, a frequency of: - whenever sitting Exercise regularly Support Garment 20-30 mm/Hg pressure to: - wear compression stocking to left lower leg. moisturize leg with lotion at night after  stocking removed. Apply stocking first thing in the morning and remove before bed. Off-Loading Open toe surgical shoe to: - to both feet. Electronic Signature(s) Signed: 09/02/2019 6:32:44 PM By: Linton Ham MD Signed: 09/03/2019 11:01:46 AM By: Carlene Coria RN Entered By: Carlene Coria on 09/02/2019 08:30:21 -------------------------------------------------------------------------------- Problem List Details Patient Name: Date of Service: [name], [name] 09/02/2019 8:00 AM Medical Record [id number] Patient Account Number: [id number] Date of Birth/Sex: Treating RN: 1960/11/30 (58 y.o. 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 07/23/2019 No Yes with fat layer exposed L97.812 Non-pressure chronic ulcer of other part of right lower 07/23/2019 No Yes leg with fat layer exposed L98.492 Non-pressure  chronic ulcer of skin of other sites with 07/23/2019 No Yes fat layer exposed I50.42 Chronic combined systolic (congestive) and diastolic 94/58/5929 No Yes (congestive) heart failure I10 Essential (primary) hypertension 07/23/2019 No Yes Inactive Problems Resolved Problems Electronic Signature(s) Signed: 09/02/2019 6:32:44 PM By: Linton Ham MD Entered By: Linton Ham on 09/02/2019 08:41:38 -------------------------------------------------------------------------------- Progress Note Details Patient Name: Date of Service: [name], [name] 09/02/2019 8:00 AM Medical Record [id number] Patient Account Number: [id number] Date of Birth/Sex: Treating RN: 1961-04-09 (58 y.o. F) Primary Care Provider: [name] Other Clinician: Referring Provider: Treating Provider/Extender:Robson, Arlis Porta, Archie Patten in Treatment: 5 Subjective History of Present Illness (HPI) 07/23/2019 on evaluation today patient presents with a myriad of wounds noted at multiple locations over her right heel, right lower extremity, and abdominal region. She also has bilateral lower extremity lymphedema which is quite significant as well. Incidentally she also has congestive heart failure and hypertension. She also is obese. With that being said I think all this is contributing as well to her lower extremity edema which is very much uncontrolled. It seems like its been at least several months since she is worn any compression according to what she tells me. With that being said I am not sure exactly when that would have been. She does have fibrotic changes in the lower extremity secondary to lymphedema worse on the left than the right. She did show me pictures of wound she had on the anterior portion of her shin which was quite significant fortunately that has healed. These issues have been intermittent at this time. Again I think that with appropriate compression therapy she may  actually be doing better than what we are seeing at this point but again I am not really sure that she is ever been extremely compliant with that. Fortunately there is no signs of active infection at this time. No fever chills noted. As far as the abdominal ulcer she initially had one wound that she thinks may have been a bug bite. Subsequently she was put a dressing on this and she states that the tape pulled skin off on other locations causing other wounds that have not healed that is been about 3 months. The wounds on her lower extremities have been intermittent over 3 years. This includes the heel. 11/19; this is a patient that I have not seen previously. She was admitted to our clinic last week with multiple wounds including several superficial circular areas on her abdomen predominantly right upper quadrant. Also 1 in her umbilicus. She has an  area on her right lateral malleolus which was new today. She has bilateral lower extremity edema with very significant stasis dermatitis on the left anterior tibial area. She has tightly adherent skin in her lower extremities probably secondary to cutaneous fibrosis in the area. We put her in compression last week. She comes in with the dressings reasonably saturated. 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 She has multiple areas on her arms from scratching. 12/3; the patient I saw for the first time 2 weeks ago. She has 3 small circular areas on her abdomen 2 in the right upper quadrant one on her umbilicus. We have been using silver alginate to this area. She also had an area on her right lateral malleolus and right heel and right medial malleolus. We have been using silver alginate here under compression. She does not have an open area on the left leg  today. We are going to order her stockings for the left leg. She clearly has chronic lymphedema in these areas. X-ray of the foot from 10/20 did not show any fracture or dislocation. The heel wound was noted there was no evidence of osteomyelitis She complains of generalized pruritus. 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 She has longstanding lymphedema. Although she does not have known liver disease I looked at his CT scan of the abdomen from February of this year that showed hepatic steatosis 12/11; patient still has no open area on the left leg we transitioned her into a stocking she had. Her stockings from [place] are supposed to be arriving later today which will be the stockings of choice. The only wound remaining on the right is the right heel 2 small superficial open areas. Everything else is well on its way to healing in the right leg few small excoriations. She has 1 major area remaining on her abdomen the rest seem to be healing 12/22 patient still has no open area on the left leg and she is still in a stocking. She had still 2 open areas on the tip of her right heel. These look like pressure related areas although the patient is not really certain how this is happening. She has an open heeled shoe that we provided. Still has 1 open area on the right lateral abdomen The patient has complaints of generalized pruritus. She has multiple excoriated areas on her abdomen that of close over her arms or thighs which I think are from scratching. She tells me that she has never had a basic work-up for this which might include basic lab work, liver functions, kidney functions, thyroid etc. She might also benefit from a dermatologist Objective Constitutional Sitting or standing Blood Pressure is within target range for patient.. Pulse regular and within target range for patient.Marland Kitchen Respirations regular, non-labored and within target range.. Temperature is  normal and within the target range for the patient.Marland Kitchen Appears in no distress. Vitals Time Taken: 7:58 AM, Height: 62 in, Weight: 300 lbs, BMI: 54.9, Temperature: 97.8 F, Pulse: 105 bpm, Respiratory Rate: 20 breaths/min, Blood Pressure: 133/49 mmHg, Capillary Blood Glucose: 152 mg/dl. General Notes: patient reported last CBG of 152 this morning Eyes Conjunctivae clear. No discharge.no icterus. Respiratory work of breathing is normal. Cardiovascular Pedal pulses are palpable. Gastrointestinal (GI) Abdomen very distended no clear shifting dullness. Psychiatric appears at normal baseline.  General Notes: Wound examoohe has 2 open areas on the right heel. Surrounding callus which I removed with #3 curette. There did not look like there was any active infection here. The tissue underneath looked healthy. Hemostasis with direct pressure ooSeveral healed areas on her abdomen which were all circular excoriations. We still have one on the right lateral abdomen. Debridement of debris off the surface of this hemostasis with direct pressure Integumentary (Hair, Skin) Multiple superficial excoriations. Dilated abdominal veins I believe. Wound #1 status is Open. Original cause of wound was Blister. The wound is located on the Right Calcaneus. The wound measures 0.2cm length x 0.2cm width x 0.1cm depth; 0.031cm^2 area and 0.003cm^3 volume. There is Fat Layer (Subcutaneous Tissue) Exposed exposed. There is no tunneling or undermining noted. There is a small amount of serous drainage noted. The wound margin is distinct with the outline attached to the wound base. There is large (67-100%) pink granulation within the wound bed. There is no necrotic tissue within the wound bed. Wound #4 status is Open. Original cause of wound was Gradually Appeared. The wound is located on the Right,Medial Abdomen - Lower Quadrant. The wound measures 0cm length x 0cm width x 0cm depth; 0cm^2 area and 0cm^3 volume. There is  no tunneling or undermining noted. There is a none present amount of drainage noted. The wound margin is flat and intact. There is no granulation within the wound bed. There is no necrotic tissue within the wound bed. Wound #5R status is Open. Original cause of wound was Gradually Appeared. The wound is located on the Right,Lateral Abdomen - Lower Quadrant. The wound measures 0.9cm length x 1.1cm width x 0.1cm depth; 0.778cm^2 area and 0.078cm^3 volume. There is Fat Layer (Subcutaneous Tissue) Exposed exposed. There is no tunneling or undermining noted. There is a small amount of serous drainage noted. The wound margin is flat and intact. There is medium (34-66%) pink granulation within the wound bed. There is a medium (34-66%) amount of necrotic tissue within the wound bed including Adherent Slough. Assessment Active Problems ICD-10 Type 2 diabetes mellitus with foot ulcer Lymphedema, not elsewhere classified Non-pressure chronic ulcer of right heel and midfoot with fat layer exposed Non-pressure chronic ulcer of other part of right lower leg with fat layer exposed Non-pressure chronic ulcer of skin of other sites with fat layer exposed Chronic combined systolic (congestive) and diastolic (congestive) heart failure Essential (primary) hypertension Procedures Wound #1 Pre-procedure diagnosis of Wound #1 is a Diabetic Wound/Ulcer of the Lower Extremity located on the Right Calcaneus .Severity of Tissue Pre Debridement is: Fat layer exposed. There was a Excisional Skin/Subcutaneous Tissue Debridement with a total area of 0.04 sq cm performed by Ricard Dillon., MD. With the following instrument(s): Curette to remove Non-Viable tissue/material. Material removed includes Subcutaneous Tissue and Slough and after achieving pain control using Other (benzoczine 20%). No specimens were taken. A time out was conducted at 08:23, prior to the start of the procedure. A Moderate amount of bleeding was  controlled with Pressure. The procedure was tolerated well with a pain level of 2 throughout and a pain level of 0 following the procedure. Post Debridement Measurements: 1.5cm length x 0.7cm width x 0.2cm depth; 0.165cm^3 volume. Character of Wound/Ulcer Post Debridement is improved. Severity of Tissue Post Debridement is: Fat layer exposed. Post procedure Diagnosis Wound #1: Same as Pre-Procedure Pre-procedure diagnosis of Wound #1 is a Diabetic Wound/Ulcer of the Lower Extremity located on the Right Calcaneus . There was a Four  Layer Compression Therapy Procedure by Carlene Coria, RN. Post procedure Diagnosis Wound #1: Same as Pre-Procedure Wound #5R Pre-procedure diagnosis of Wound #5R is an Atypical located on the Right,Lateral Abdomen - Lower Quadrant . There was a Excisional Skin/Subcutaneous Tissue Debridement with a total area of 0.99 sq cm performed by Ricard Dillon., MD. With the following instrument(s): Curette to remove Non-Viable tissue/material. Material removed includes Subcutaneous Tissue and Slough and after achieving pain control using Other (benzoczine 20%). No specimens were taken. A time out was conducted at 08:23, prior to the start of the procedure. A Moderate amount of bleeding was controlled with Pressure. The procedure was tolerated well with a pain level of 2 throughout and a pain level of 0 following the procedure. Post Debridement Measurements: 0.9cm length x 1.1cm width x 0.1cm depth; 0.078cm^3 volume. Character of Wound/Ulcer Post Debridement is improved. Post procedure Diagnosis Wound #5R: Same as Pre-Procedure Plan Follow-up Appointments: Return Appointment in 1 week. Skin Barriers/Peri-Wound Care: TCA Cream or Ointment Wound Cleansing: May shower with protection. Primary Wound Dressing: Wound #1 Right Calcaneus: Calcium Alginate with Silver Wound #4 Right,Medial Abdomen - Lower Quadrant: Hydrofera Blue Wound #5R Right,Lateral Abdomen - Lower  Quadrant: Hydrofera Blue Secondary Dressing: Wound #1 Right Calcaneus: Dry Gauze Heel Cup Wound #4 Right,Medial Abdomen - Lower Quadrant: Dry Gauze - or ABD pad Wound #5R Right,Lateral Abdomen - Lower Quadrant: Dry Gauze - or ABD pad Edema Control: 4 layer compression - Right Lower Extremity Avoid standing for long periods of time Elevate legs to the level of the heart or above for 30 minutes daily and/or when sitting, a frequency of: - whenever sitting Exercise regularly Support Garment 20-30 mm/Hg pressure to: - wear compression stocking to left lower leg. moisturize leg with lotion at night after stocking removed. Apply stocking first thing in the morning and remove before bed. Off-Loading: Open toe surgical shoe to: - to both feet. 1. Continue with silver alginate on the heel and Hydrofera Blue on the abdomen. 2. We will try to look through [facility] health link about what we know about medical causes of generalized pruritus Electronic Signature(s) Signed: 09/02/2019 6:32:44 PM By: Linton Ham MD Entered By: Linton Ham on 09/02/2019 08:49:44 -------------------------------------------------------------------------------- SuperBill Details Patient Name: Date of Service: [name], [name] 09/02/2019 Medical Record [id number] Patient Account Number: [id number] Date of Birth/Sex: Treating RN: Mar 15, 1961 (58 y.o. 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 L97.812 Non-pressure chronic ulcer of other part of right lower leg with fat layer exposed L98.492 Non-pressure chronic ulcer of skin of other sites with fat layer  exposed I50.42 Chronic combined systolic (congestive) and diastolic (congestive) heart failure I10 Essential (primary) hypertension Facility Procedures CPT4 Code Description: 52841324 11042 - DEB SUBQ TISSUE 20 SQ CM/< ICD-10 Diagnosis Description L97.412 Non-pressure chronic ulcer of right heel and midfoot with f L97.812 Non-pressure chronic ulcer of other part of right lower leg Modifier: at layer expo with fat lay Quantity: 1 sed er exposed Physician Procedures CPT4 Code Description: 4010272 11042 - WC PHYS SUBQ TISS 20 SQ CM ICD-10 Diagnosis Description L97.412 Non-pressure chronic ulcer of right heel and midfoot with f L97.812 Non-pressure chronic ulcer of other part of right lower leg Modifier: at layer expo with fat lay Quantity:  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

## 2019-09-03 NOTE — Progress Notes (Signed)
Breit, [name] [name] ([id number]) Visit Report for 09/02/2019 Arrival Information Details Patient Name: Date of Service: [name] [name]. 09/02/2019 8:00 AM Medical Record [id number] Patient Account Number: [id number] Date of Birth/Sex: Treating RN: Dec 29, 1960 (58 y.o. [name] [name] Primary Care [name]: [name] Other Clinician: Referring Lowana Hable: Treating Starlett Pehrson/Extender:Robson, Arlis Porta, Archie Patten in Treatment: 5 Visit Information History Since Last Visit Added or deleted any medications: No Patient Arrived: Gilford Rile Any new allergies or adverse reactions: No Arrival Time: 07:56 Had a fall or experienced change in No Accompanied By: husband activities of daily living that may affect Transfer Assistance: None risk of falls: Patient Identification Verified: Yes Signs or symptoms of abuse/neglect since last No Secondary Verification Process Yes visito Completed: Hospitalized since last visit: No Patient Requires Transmission- No Implantable device outside of the clinic excluding No Based Precautions: cellular tissue based products placed in the center Patient Has Alerts: Yes since last visit: Patient Alerts: Patient on Blood Has Dressing in Place as Prescribed: Yes Thinner Pain Present Now: No Electronic Signature(s) Signed: 09/03/2019 4:54:21 PM By: Kela Millin Entered By: Kela Millin on 09/02/2019 07:56:42 -------------------------------------------------------------------------------- Compression Therapy Details Patient Name: Date of Service: [name] [name] M. 09/02/2019 8:00 AM Medical Record [id number] Patient Account Number: [id number] Date of Birth/Sex: Treating RN: 1961/01/06 (58 y.o. [name] [name] Primary Care Aziya Arena: [name] Other Clinician: Referring Zooey Schreurs: Treating Courtland Reas/Extender:Robson, Arlis Porta, Archie Patten in Treatment: 5 Compression Therapy Performed for Wound  Wound #1 Right Calcaneus Assessment: Performed By: Clinician Carlene Coria, RN Compression Type: Four Layer Post Procedure Diagnosis Same as Pre-procedure Electronic Signature(s) Signed: 09/03/2019 11:01:46 AM By: Carlene Coria RN Entered By: Carlene Coria on 09/02/2019 08:32:12 -------------------------------------------------------------------------------- Encounter Discharge Information Details Patient Name: Date of Service: [name] [name] M. 09/02/2019 8:00 AM Medical Record [id number] Patient Account Number: [id number] Date of Birth/Sex: Treating RN: 05/08/1961 (58 y.o. [name] [name] Primary Care Ninah Moccio: [name] Other Clinician: Referring Marquisa Salih: Treating Lyniah Fujita/Extender:Robson, Arlis Porta, Archie Patten in Treatment: 5 Encounter Discharge Information Items Post Procedure Vitals Discharge Condition: Stable Temperature (F): 97.8 Ambulatory Status: Ambulatory Pulse (bpm): 105 Discharge Destination: Home Respiratory Rate (breaths/min): 20 Transportation: Private Auto Blood Pressure (mmHg): 133/49 Accompanied By: husband Schedule Follow-up Appointment: Yes Clinical Summary of Care: Electronic Signature(s) Signed: 09/02/2019 5:14:15 PM By: Deon Pilling Entered By: Deon Pilling on 09/02/2019 08:38:44 -------------------------------------------------------------------------------- Lower Extremity Assessment Details Patient Name: Date of Service: [name] [name] M. 09/02/2019 8:00 AM Medical Record [id number] Patient Account Number: [id number] Date of Birth/Sex: Treating RN: August 16, 1961 (58 y.o. [name] [name] Primary Care Haven Pylant: [name] Other Clinician: Referring Nashay Brickley: Treating Joelene Barriere/Extender:Robson, Arlis Porta, Archie Patten in Treatment: 5 Edema Assessment Assessed: [Left: No] [Right: No] Edema: [Left: Yes] [Right: Yes] Calf Left: Right: Point of Measurement: 35 cm From Medial Instep 40 cm 40  cm Ankle Left: Right: Point of Measurement: 10 cm From Medial Instep 21.5 cm 21.4 cm Vascular Assessment Pulses: Dorsalis Pedis Palpable: [Left:Yes] [Right:Yes] Electronic Signature(s) Signed: 09/03/2019 4:54:21 PM By: Kela Millin Entered By: Kela Millin on 09/02/2019 07:59:53 -------------------------------------------------------------------------------- Multi Wound Chart Details Patient Name: Date of Service: [name] [name] M. 09/02/2019 8:00 AM Medical Record [id number] Patient Account Number: [id number] Date of Birth/Sex: Treating RN: [birth year] (58 y.o. F) Primary Care Berlie Persky: [name] Other Clinician: Referring Jahziel Sinn: Treating Jalaila Caradonna/Extender:Robson, Arlis Porta, Archie Patten in Treatment: 5 Vital Signs Height(in): 62 Capillary Blood 152 Glucose(mg/dl): Weight(lbs): 300 Pulse(bpm): 105 Body Mass Index(BMI): 55 Blood Pressure(mmHg): 133/49 Temperature(F): 97.8 Respiratory 20 Rate(breaths/min): Photos: [1:No Photos] [4:No Photos] [5R:No Photos] Wound  Location: [1:Right Calcaneus] [4:Right Abdomen - Lower Quadrant - Medial] [5R:Right Abdomen - Lower Quadrant - Lateral] Wounding Event: [1:Blister] [4:Gradually Appeared] [5R:Gradually Appeared] Primary Etiology: [1:Diabetic Wound/Ulcer of the Atypical Lower Extremity] [5R:Atypical] Comorbid History: [1:Cataracts, Lymphedema, Cataracts, Lymphedema, Asthma, Congestive Heart Asthma, Congestive Heart Failure, Coronary Artery Disease, Hypertension, Type II Diabetes, Rheumatoid Arthritis, Neuropathy] [4:Failure, Coronary Artery Disease,  Hypertension, Type II Diabetes, Rheumatoid Arthritis, Neuropathy] [5R:Cataracts, Lymphedema, Asthma, Congestive Heart Failure, Coronary Artery Disease, Hypertension, Type II Diabetes, Rheumatoid Arthritis, Neuropathy] Date Acquired: [1:09/11/2016] [4:05/13/2019] [5R:05/13/2019] Weeks of Treatment: [1:5] [4:5] [5R:5] Wound Status: [1:Open] [4:Open]  [5R:Open] Wound Recurrence: [1:No] [4:No] [5R:Yes] Measurements L x W x D [1:0.2x0.2x0.1] [4:0x0x0] [5R:0.9x1.1x0.1] (cm) Area (cm) : [1:0.031] [4:0] [5R:0.778] Volume (cm) : [1:0.003] [4:0] [5R:0.078] % Reduction in Area: [1:98.60%] [4:100.00%] [5R:17.40%] % Reduction in Volume: [1:99.30%] [4:100.00%] [5R:17.00%] Classification: [1:Grade 2] [4:Full Thickness Without Exposed Support Structures Exposed Support Structures] [5R:Full Thickness Without] Exudate Amount: [1:Small] [4:None Present] [5R:Small] Exudate Type: [1:Serous] [4:N/A] [5R:Serous] Exudate Color: [1:amber] [4:N/A] [5R:amber] Wound Margin: [1:Distinct, outline attached] [4:Flat and Intact] [5R:Flat and Intact] Granulation Amount: [1:Large (67-100%)] [4:None Present (0%)] [5R:Medium (34-66%)] Granulation Quality: [1:Pink] [4:N/A] [5R:Pink] Necrotic Amount: [1:None Present (0%)] [4:None Present (0%)] [5R:Medium (34-66%)] Exposed Structures: [1:Fat Layer (Subcutaneous Tissue) Exposed: Yes Fascia: No Tendon: No Muscle: No Joint: No Bone: No] [4:Fascia: No Fat Layer (Subcutaneous Tissue) Exposed: Yes Tissue) Exposed: No Tendon: No Muscle: No Joint: No Bone: No] [5R:Fat Layer (Subcutaneous  Fascia: No Tendon: No Muscle: No Joint: No Bone: No] Epithelialization: [1:Small (1-33%)] [4:Large (67-100%)] [5R:Large (67-100%)] Debridement: [1:Debridement - Excisional] [4:N/A] [5R:Debridement - Excisional] Pre-procedure [1:08:23] [4:N/A] [5R:08:23] Verification/Time Out Taken: Pain Control: [1:Other] [4:N/A] [5R:Other] Tissue Debrided: [1:Subcutaneous, Slough] [4:N/A] [5R:Subcutaneous, Slough] Level: [1:Skin/Subcutaneous Tissue] [4:N/A] [5R:Skin/Subcutaneous Tissue] Debridement Area (sq cm):0.04 [4:N/A] [5R:0.99] Instrument: [1:Curette] [4:N/A] [5R:Curette] Bleeding: [1:Moderate] [4:N/A] [5R:Moderate] Hemostasis Achieved: [1:Pressure] [4:N/A] [5R:Pressure] Procedural Pain: [1:2] [4:N/A] [5R:2] Post Procedural Pain: [1:0] [4:N/A]  [5R:0] Debridement Treatment Procedure was tolerated [4:N/A] [5R:Procedure was tolerated] Response: [1:well] [5R:well] Post Debridement [1:1.5x0.7x0.2] [4:N/A] [5R:0.9x1.1x0.1] Measurements L x W x D (cm) Post Debridement [1:0.165] [4:N/A] [5R:0.078] Volume: (cm) Procedures Performed: Compression Therapy [1:Debridement] [4:N/A] [5R:Debridement] Treatment Notes Wound #1 (Right Calcaneus) 1. Cleanse With Wound Cleanser Soap and water 2. Periwound Care TCA Cream 3. Primary Dressing Applied Calcium Alginate Ag 4. Secondary Dressing Dry Gauze Heel Cup 6. Support Layer Applied 4 layer compression wrap Notes netting. Wound #5R (Right, Lateral Abdomen - Lower Quadrant) 1. Cleanse With Wound Cleanser 2. Periwound Care Skin Prep 3. Primary Dressing Applied Hydrofera Blue 4. Secondary Dressing Foam Border Dressing 5. Secured With Office manager) Signed: 09/02/2019 6:32:44 PM By: Linton Ham MD Entered By: Linton Ham on 09/02/2019 08:41:45 -------------------------------------------------------------------------------- Multi-Disciplinary Care Plan Details Patient Name: Date of Service: [name] [name] M. 09/02/2019 8:00 AM Medical Record [id number] Patient Account Number: [id number] Date of Birth/Sex: Treating RN: August 28, 1961 (58 y.o. [name] [name] Primary Care Luisana Lutzke: [name] Other Clinician: Referring Adean Milosevic: Treating Zaylei Mullane/Extender:Robson, Arlis Porta, Archie Patten in Treatment: 5 Active Inactive Pressure Nursing Diagnoses: Knowledge deficit related to causes and risk factors for pressure ulcer development Potential for impaired tissue integrity related to pressure, friction, moisture, and shear Goals: Patient/caregiver will verbalize understanding of pressure ulcer management Date Initiated: 07/23/2019 Target Resolution Date: 09/19/2019 Goal Status: Active Interventions: Assess offloading  mechanisms upon admission and as needed Notes: Venous Leg Ulcer Nursing Diagnoses: Knowledge deficit related to disease process and management Potential for venous Insuffiency (use before diagnosis confirmed) Goals: Patient will  maintain optimal edema control Date Initiated: 07/23/2019 Target Resolution Date: 09/19/2019 Goal Status: Active Interventions: Assess peripheral edema status every visit. Compression as ordered Treatment Activities: Therapeutic compression applied : 07/23/2019 Notes: Wound/Skin Impairment Nursing Diagnoses: Impaired tissue integrity Knowledge deficit related to ulceration/compromised skin integrity Goals: Patient/caregiver will verbalize understanding of skin care regimen Date Initiated: 07/23/2019 Date Inactivated: 08/14/2019 Target Resolution Date: 08/20/2019 Goal Status: Met Ulcer/skin breakdown will have a volume reduction of 30% by week 4 Date Initiated: 07/23/2019 Target Resolution Date: 09/19/2019 Goal Status: Active Interventions: Assess patient/caregiver ability to obtain necessary supplies Assess patient/caregiver ability to perform ulcer/skin care regimen upon admission and as needed Assess ulceration(s) every visit Treatment Activities: Skin care regimen initiated : 07/23/2019 Topical wound management initiated : 07/23/2019 Notes: Electronic Signature(s) Signed: 09/03/2019 11:01:46 AM By: Carlene Coria RN Entered By: Carlene Coria on 09/02/2019 08:00:33 -------------------------------------------------------------------------------- Pain Assessment Details Patient Name: Date of Service: [name] [name] M. 09/02/2019 8:00 AM Medical Record [id number] Patient Account Number: [id number] Date of Birth/Sex: Treating RN: 04-25-1961 (58 y.o. [name] [name] Primary Care [place]ee Nase: [name] Other Clinician: Referring Chaundra Abreu: Treating Eleesha Purkey/Extender:Robson, Arlis Porta, Archie Patten in Treatment: 5 Active  Problems Location of Pain Severity and Description of Pain Patient Has Paino No Site Locations Pain Management and Medication Current Pain Management: Electronic Signature(s) Signed: 09/03/2019 4:54:21 PM By: Kela Millin Entered By: Kela Millin on 09/02/2019 07:59:30 -------------------------------------------------------------------------------- Patient/Caregiver Education Details Patient Name: Date of Service: [name] [name] M. 12/22/2020andnbsp8:00 AM Medical Record Patient Account Number: [id number] Number: Treating RN: Carlene Coria Date of Birth/Gender: [birth year] (58 y.o. F) Other Clinician: Primary Care Physician: Marylin Crosby Referring Physician: Physician/Extender: Tonny Bollman in Treatment: 5 Education Assessment Education Provided To: Patient Education Topics Provided Wound/Skin Impairment: Methods: Explain/Verbal Responses: State content correctly Electronic Signature(s) Signed: 09/03/2019 11:01:46 AM By: Carlene Coria RN Entered By: Carlene Coria on 09/02/2019 08:00:48 -------------------------------------------------------------------------------- Wound Assessment Details Patient Name: Date of Service: [name] [name] M. 09/02/2019 8:00 AM Medical Record [id number] Patient Account Number: [id number] Date of Birth/Sex: Treating RN: 12-13-1960 (58 y.o. [name] [name] Primary Care Aaliyah Gavel: [name] Other Clinician: Referring Mckenzey Parcell: Treating Suhailah Kwan/Extender:Robson, Arlis Porta, Archie Patten in Treatment: 5 Wound Status Wound Number: 1 Primary Diabetic Wound/Ulcer of the Lower Extremity Etiology: Wound Location: Right Calcaneus Wound Open Wounding Event: Blister Status: Date Acquired: 09/11/2016 Comorbid Cataracts, Lymphedema, Asthma, Congestive Weeks Of Treatment: 5 History: Heart Failure, Coronary Artery Disease, Clustered Wound: No Hypertension, Type II  Diabetes, Rheumatoid Arthritis, Neuropathy Photos Wound Measurements Length: (cm) 0.2 Width: (cm) 0.2 Depth: (cm) 0.1 Area: (cm) 0.031 Volume: (cm) 0.003 Wound Description Classification: Grade 2 Wound Margin: Distinct, outline attached Exudate Amount: Small Exudate Type: Serous Exudate Color: amber Wound Bed Granulation Amount: Large (67-100%) Granulation Quality: Pink Necrotic Amount: None Present (0%) After Cleansing: No brino Yes Exposed Structure posed: No (Subcutaneous Tissue) Exposed: Yes posed: No posed: No sed: No ed: No % Reduction in Area: 98.6% % Reduction in Volume: 99.3% Epithelialization: Small (1-33%) Tunneling: No Undermining: No Foul Odor Slough/Fi Fascia Ex Fat Layer Tendon Ex Muscle Ex Joint Expo Bone Expos Treatment Notes Wound #1 (Right Calcaneus) 1. Cleanse With Wound Cleanser Soap and water 2. Periwound Care TCA Cream 3. Primary Dressing Applied Calcium Alginate Ag 4. Secondary Dressing Dry Gauze Heel Cup 6. Support Layer Applied 4 layer compression wrap Notes netting. Electronic Signature(s) Signed: 09/03/2019 3:48:24 PM By: Mikeal Hawthorne EMT/HBOT Signed: 09/03/2019 4:54:21 PM By: Kela Millin Entered By: Mikeal Hawthorne on 09/03/2019 11:18:32 -------------------------------------------------------------------------------- Wound  Assessment Details Patient Name: Date of Service: [name] [name] SADEK. 09/02/2019 8:00 AM Medical Record [id number] Patient Account Number: [id number] Date of Birth/Sex: Treating RN: 11/22/1960 (58 y.o. [name] [name] Primary Care Madden Piazza: [name] Other Clinician: Referring Maizey Menendez: Treating Elie Leppo/Extender:Robson, Arlis Porta, Archie Patten in Treatment: 5 Wound Status Wound Number: 4 Primary Atypical Etiology: Wound Location: Right Abdomen - Lower Quadrant - Medial Wound Healed - Epithelialized Status: Wounding Event: Gradually Appeared Comorbid  Cataracts, Lymphedema, Asthma, Congestive Date Acquired: 05/13/2019 History: Heart Failure, Coronary Artery Disease, Weeks Of Treatment: 5 Hypertension, Type II Diabetes, Rheumatoid Clustered Wound: No Arthritis, Neuropathy Photos Wound Measurements Length: (cm) 0 % Reduct Width: (cm) 0 % Reduct Depth: (cm) 0 Epitheli Area: (cm) 0 Tunneli Volume: (cm) 0 Undermi Wound Description Classification: Full Thickness Without Exposed Support Foul Odo Structures Slough/F Wound Flat and Intact Margin: Exudate None Present Amount: Wound Bed Granulation Amount: None Present (0%) Necrotic Amount: None Present (0%) Fascia E Fat Laye Tendon E Muscle E Joint Ex Bone Exp r After Cleansing: No ibrino No Exposed Structure xposed: No r (Subcutaneous Tissue) Exposed: No xposed: No xposed: No posed: No osed: No ion in Area: 100% ion in Volume: 100% alization: Large (67-100%) ng: No ning: No Electronic Signature(s) Signed: 09/03/2019 3:48:24 PM By: Mikeal Hawthorne EMT/HBOT Signed: 09/03/2019 4:54:21 PM By: Kela Millin Entered By: Mikeal Hawthorne on 09/03/2019 11:17:45 -------------------------------------------------------------------------------- Wound Assessment Details Patient Name: Date of Service: [name] [name] M. 09/02/2019 8:00 AM Medical Record [id number] Patient Account Number: [id number] Date of Birth/Sex: Treating RN: [birth year] (58 y.o. [name] [name] Primary Care Rajean Desantiago: [name] Other Clinician: Referring Natoshia Souter: Treating Haylin Camilli/Extender:Robson, Arlis Porta, Archie Patten in Treatment: 5 Wound Status Wound Number: 5R Primary Atypical Right Abdomen - Lower Quadrant - Etiology: Wound Location: Lateral Wound Open Status: Status: Wounding Event: Gradually Appeared Comorbid Cataracts, Lymphedema, Asthma, Congestive Date Acquired: 05/13/2019 History: Heart Failure, Coronary Artery Disease, Weeks Of Treatment: 5 Hypertension,  Type II Diabetes, Rheumatoid Clustered Wound: No Arthritis, Neuropathy Photos Wound Measurements Length: (cm) 0.9 % Reduct Width: (cm) 1.1 % Reduct Depth: (cm) 0.1 Epitheli Area: (cm) 0.778 Tunneli Volume: (cm) 0.078 Undermi Wound Description Classification: Full Thickness Without Exposed Support Foul Odo Structures Slough/F Wound Flat and Intact Margin: Exudate Small Amount: Exudate Serous Type: Exudate amber Color: Wound Bed Granulation Amount: Medium (34-66%) Granulation Quality: Pink Fascia E Necrotic Amount: Medium (34-66%) Fat Laye Necrotic Quality: Adherent Slough Tendon E Muscle E Joint Ex Bone Exp r After Cleansing: No ibrino Yes Exposed Structure xposed: No r (Subcutaneous Tissue) Exposed: Yes xposed: No xposed: No posed: No osed: No ion in Area: 17.4% ion in Volume: 17% alization: Large (67-100%) ng: No ning: No Treatment Notes Wound #5R (Right, Lateral Abdomen - Lower Quadrant) 1. Cleanse With Wound Cleanser 2. Periwound Care Skin Prep 3. Primary Dressing Applied Hydrofera Blue 4. Secondary Dressing Foam Border Dressing 5. Secured With Office manager) Signed: 09/03/2019 3:48:24 PM By: Mikeal Hawthorne EMT/HBOT Signed: 09/03/2019 4:54:21 PM By: Kela Millin Entered By: Mikeal Hawthorne on 09/03/2019 11:18:08 -------------------------------------------------------------------------------- Vitals Details Patient Name: Date of Service: [name] [name], Deleah M. 09/02/2019 8:00 AM Medical Record [id number] Patient Account Number: [id number] Date of Birth/Sex: Treating RN: [birth year] (59 y.o. [name] [name] Primary Care Saba Neuman: [name] Other Clinician: Referring Zahira Brummond: Treating Koralyn Prestage/Extender:Robson, Arlis Porta, Archie Patten in Treatment: 5 Vital Signs Time Taken: 07:58 Temperature (F): 97.8 Height (in): 62 Pulse (bpm): 105 Weight (lbs): 300 Respiratory Rate  (breaths/min): 20 Body Mass Index (BMI): 54.9 Blood Pressure (  mmHg): 133/49 Capillary Blood Glucose (mg/dl): 152 Reference Range: 80 - 120 mg / dl Notes patient reported last CBG of 152 this morning Electronic Signature(s) Signed: 09/03/2019 4:54:21 PM By: Kela Millin Entered By: Kela Millin on 09/02/2019 07:58:09

## 2019-09-08 ENCOUNTER — Other Ambulatory Visit: Payer: Self-pay | Admitting: Family Medicine

## 2019-09-08 MED ORDER — EPINEPHRINE 0.3 MG/0.3ML IJ SOAJ: INTRAMUSCULAR | 0 refills | Status: DC

## 2019-09-09 ENCOUNTER — Other Ambulatory Visit: Payer: Self-pay

## 2019-09-09 ENCOUNTER — Encounter (HOSPITAL_BASED_OUTPATIENT_CLINIC_OR_DEPARTMENT_OTHER): Payer: BC Managed Care – PPO | Admitting: Internal Medicine

## 2019-09-09 DIAGNOSIS — E11621 Type 2 diabetes mellitus with foot ulcer: Secondary | ICD-10-CM | POA: Diagnosis not present

## 2019-09-09 NOTE — Progress Notes (Signed)
Treadwell, TEISHA TROWBRIDGE ([id number]) Visit Report for 09/09/2019 HPI Details Patient Name: [name], [name] 09/09/2019 8:30 Date of Service: AM Medical Record [id number] Number: Patient Account Number: [id number] Treating RN: 1961-04-20 (58 y.o. Date of Birth/Sex: Female) Other Clinician: Primary Care Provider: [name] Treating Linton Ham Referring Provider: Provider/Extender: Tonny Bollman in Treatment: 6 History of Present Illness HPI Description: 07/23/2019 on evaluation today patient presents with a myriad of wounds noted at multiple locations over her right heel, right lower extremity, and abdominal region. She also has bilateral lower extremity lymphedema which is quite significant as well. Incidentally she also has congestive heart failure and hypertension. She also is obese. With that being said I think all this is contributing as well to her lower extremity edema which is very much uncontrolled. It seems like its been at least several months since she is worn any compression according to what she tells me. With that being said I am not sure exactly when that would have been. She does have fibrotic changes in the lower extremity secondary to lymphedema worse on the left than the right. She did show me pictures of wound she had on the anterior portion of her shin which was quite significant fortunately that has healed. These issues have been intermittent at this time. Again I think that with appropriate compression therapy she may actually be doing better than what we are seeing at this point but again I am not really sure that she is ever been extremely compliant with that. Fortunately there is no signs of active infection at this time. No fever chills noted. As far as the abdominal ulcer she initially had one wound that she thinks may have been a bug bite. Subsequently she was put a dressing on this and she states that the tape pulled skin off on other  locations causing other wounds that have not healed that is been about 3 months. The wounds on her lower extremities have been intermittent over 3 years. This includes the heel. 11/19; this is a patient that I have not seen previously. She was admitted to our clinic last week with multiple wounds including several superficial circular areas on her abdomen predominantly right upper quadrant. Also 1 in her umbilicus. She has an area on her right lateral malleolus which was new today. She has bilateral lower extremity edema with very significant stasis dermatitis on the left anterior tibial area. She has tightly adherent skin in her lower extremities probably secondary to cutaneous fibrosis in the area. We put her in compression last week. She comes in with the dressings reasonably saturated. 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 She has multiple areas on her arms from scratching. 12/3; the patient I saw for the first time 2 weeks ago. She has 3 small circular areas on her abdomen 2 in the right upper quadrant one on her umbilicus. We have been using silver alginate to this area. She also had an area on her right lateral malleolus and right heel and right medial malleolus. We have been using silver alginate here under compression. She does not have an open area on the left leg today. We are going to order her stockings for the left leg. She clearly has chronic lymphedema in these areas. X-ray of the foot from 10/20 did not show any fracture or dislocation. The  heel wound was noted there was no evidence of osteomyelitis She complains of generalized pruritus. 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 She has longstanding lymphedema. Although she does not have known  liver disease I looked at his CT scan of the abdomen from February of this year that showed hepatic steatosis 12/11; patient still has no open area on the left leg we transitioned her into a stocking she had. Her stockings from [facility] are supposed to be arriving later today which will be the stockings of choice. The only wound remaining on the right is the right heel 2 small superficial open areas. Everything else is well on its way to healing in the right leg few small excoriations. She has 1 major area remaining on her abdomen the rest seem to be healing 12/22 patient still has no open area on the left leg and she is still in a stocking. She had still 2 open areas on the tip of her right heel. These look like pressure related areas although the patient is not really certain how this is happening. She has an open heeled shoe that we provided. Still has 1 open area on the right lateral abdomen The patient has complaints of generalized pruritus. She has multiple excoriated areas on her abdomen that of close over her arms or thighs which I think are from scratching. She tells me that she has never had a basic work-up for this which might include basic lab work, liver functions, kidney functions, thyroid etc. She might also benefit from a dermatologist 12/29; the patient has a deeper larger more painful wound on the tip of her right heel. Again these look like pressure ulcers although she wears an open toed heel pads or heel at night to prevent it from hitting the mattress etc. They tell me and remind me that this is been present on and off for about 2 years that has closed over but then will reopen. I did look over her lab work that was available in [place]. She does not have elevated liver function tests or creatinine. I was not able to see a TSH. This was in response to her complaints of generalized itching and a itch/scratch cycle. I also noted that she is on OxyContin and oxycodone  and of course narcotics can cause generalized pruritus as a side effect Electronic Signature(s) Signed: 09/09/2019 6:15:30 PM By: Linton Ham MD Entered By: Linton Ham on 09/09/2019 09:08:16 -------------------------------------------------------------------------------- Physical Exam Details Patient Name: Tienda, Joellyn M. 09/09/2019 8:30 Date of Service: AM Medical Record [id number] Number: Patient Account Number: [id number] Treating RN: 1961-07-05 (58 y.o. Date of Birth/Sex: Female) Other Clinician: Primary Care Provider: Marylin Crosby Referring Provider: Provider/Extender: Tonny Bollman in Treatment: 6 Constitutional Sitting or standing Blood Pressure is within target range for patient.. Pulse regular and within target range for patient.Marland Kitchen Respirations regular, non-labored and within target range.. Temperature is normal and within the target range for the patient.Marland Kitchen Appears in no distress. Respiratory work of breathing is normal. Cardiovascular Pedal pulses are easily palpable on the right. Integumentary (Hair, Skin) Some erythema and tenderness around the wound on the tip of her heel. Also some measurable depth. Psychiatric appears at normal baseline. Notes Wound exam; right heel has 1 open area I think things might of coalesced. There is tenderness above this and some bogginess. Tissue at the base of this does not look unhealthy. There is no purulent drainage and nothing I really  felt would benefit from culturing. Healed abdominal wounds. There is still 1 open area which looks healthy. This is on the right lower lateral abdomen. No debridement was required in either area. Electronic Signature(s) Signed: 09/09/2019 6:15:30 PM By: Linton Ham MD Entered By: Linton Ham on 09/09/2019 09:10:22 -------------------------------------------------------------------------------- Physician Orders Details Patient Name:  Stroble, Floyd M. 09/09/2019 8:30 Date of Service: AM Medical Record [id number] Number: Patient Account Number: [id number] Treating RN: 08-08-1961 (58 y.o. 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 Skin Barriers/Peri-Wound Care TCA Cream or Ointment Wound Cleansing May shower with protection. Primary Wound Dressing Wound #1 Right Calcaneus Calcium Alginate with Silver Wound #5R Right,Lateral Abdomen - Lower Quadrant Hydrofera Blue Secondary Dressing Wound #1 Right Calcaneus Dry Gauze Heel Cup Wound #5R Right,Lateral Abdomen - Lower Quadrant Dry Gauze - or ABD pad Edema Control 4 layer compression - Right Lower Extremity Avoid standing for long periods of time Elevate legs to the level of the heart or above for 30 minutes daily and/or when sitting, a frequency of: - whenever sitting Exercise regularly Support Garment 20-30 mm/Hg pressure to: - wear compression stocking to left lower leg. moisturize leg with lotion at night after stocking removed. Apply stocking first thing in the morning and remove before bed. Off-Loading Open toe surgical shoe to: - to both feet. Electronic Signature(s) Signed: 09/09/2019 5:43:27 PM By:  Carlene Coria RN Signed: 09/09/2019 6:15:30 PM By: Linton Ham MD Entered By: Carlene Coria on 09/09/2019 08:33:10 -------------------------------------------------------------------------------- Problem List Details Patient Name: Hegarty, Johnnetta M. 09/09/2019 8:30 Date of Service: AM Medical Record [id number] Number: Patient Account Number: [id number] Treating RN: [birth year] (58 y.o. 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 07/23/2019 No Yes with fat layer exposed L97.812 Non-pressure chronic ulcer of other part of right lower 07/23/2019 No Yes leg with fat layer exposed L98.492 Non-pressure chronic ulcer of skin of other sites with 07/23/2019 No Yes fat layer exposed I50.42 Chronic combined systolic (congestive) and diastolic 31/49/7026 No Yes (congestive) heart failure I10 Essential (primary) hypertension 07/23/2019 No Yes Inactive Problems Resolved Problems Electronic Signature(s) Signed: 09/09/2019 6:15:30 PM By: Linton Ham MD Entered By: Linton Ham on 09/09/2019 09:05:39 -------------------------------------------------------------------------------- Progress Note Details Patient Name: Smithers, Erisa M. 09/09/2019 8:30 Date of Service: AM Medical Record [id number] Number: Patient Account Number: [id number] Treating RN: 1961-05-04 (58 y.o. Date of Birth/Sex: Female) Other Clinician: Primary Care Provider: [name] Treating Linton Ham Referring Provider: Provider/Extender: Tonny Bollman in  Treatment: 6 Subjective History of Present Illness (HPI) 07/23/2019 on evaluation today patient presents with a myriad of wounds noted  at multiple locations over her right heel, right lower extremity, and abdominal region. She also has bilateral lower extremity lymphedema which is quite significant as well. Incidentally she also has congestive heart failure and hypertension. She also is obese. With that being said I think all this is contributing as well to her lower extremity edema which is very much uncontrolled. It seems like its been at least several months since she is worn any compression according to what she tells me. With that being said I am not sure exactly when that would have been. She does have fibrotic changes in the lower extremity secondary to lymphedema worse on the left than the right. She did show me pictures of wound she had on the anterior portion of her shin which was quite significant fortunately that has healed. These issues have been intermittent at this time. Again I think that with appropriate compression therapy she may actually be doing better than what we are seeing at this point but again I am not really sure that she is ever been extremely compliant with that. Fortunately there is no signs of active infection at this time. No fever chills noted. As far as the abdominal ulcer she initially had one wound that she thinks may have been a bug bite. Subsequently she was put a dressing on this and she states that the tape pulled skin off on other locations causing other wounds that have not healed that is been about 3 months. The wounds on her lower extremities have been intermittent over 3 years. This includes the heel. 11/19; this is a patient that I have not seen previously. She was admitted to our clinic last week with multiple wounds including several superficial circular areas on her abdomen predominantly right upper quadrant. Also 1 in her umbilicus. She has an  area on her right lateral malleolus which was new today. She has bilateral lower extremity edema with very significant stasis dermatitis on the left anterior tibial area. She has tightly adherent skin in her lower extremities probably secondary to cutaneous fibrosis in the area. We put her in compression last week. She comes in with the dressings reasonably saturated. 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 She has multiple areas on her arms from scratching. 12/3; the patient I saw for the first time 2 weeks ago. She has 3 small circular areas on her abdomen 2 in the right upper quadrant one on her umbilicus. We have been using silver alginate to this area. She also had an area on her right lateral malleolus and right heel and right medial malleolus. We have been using silver alginate here under compression. She does not have an open area on the left leg today. We are going to order her stockings for the left leg. She clearly has chronic lymphedema in these areas. X-ray of the foot from 10/20 did not show any fracture or dislocation. The heel wound was noted there was no evidence of osteomyelitis She complains of generalized pruritus. 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 She has longstanding lymphedema. Although she does not have known liver disease I looked at his CT scan of the abdomen from February of this year that showed hepatic steatosis 12/11; patient still has no open area on the left leg we transitioned her into a stocking she  had. Her stockings from [facility] are supposed to be arriving later today which will be the stockings of choice. The only wound remaining on the right is the right heel 2 small superficial open areas. Everything else is well on  its way to healing in the right leg few small excoriations. She has 1 major area remaining on her abdomen the rest seem to be healing 12/22 patient still has no open area on the left leg and she is still in a stocking. She had still 2 open areas on the tip of her right heel. These look like pressure related areas although the patient is not really certain how this is happening. She has an open heeled shoe that we provided. Still has 1 open area on the right lateral abdomen The patient has complaints of generalized pruritus. She has multiple excoriated areas on her abdomen that of close over her arms or thighs which I think are from scratching. She tells me that she has never had a basic work-up for this which might include basic lab work, liver functions, kidney functions, thyroid etc. She might also benefit from a dermatologist 12/29; the patient has a deeper larger more painful wound on the tip of her right heel. Again these look like pressure ulcers although she wears an open toed heel pads or heel at night to prevent it from hitting the mattress etc. They tell me and remind me that this is been present on and off for about 2 years that has closed over but then will reopen. I did look over her lab work that was available in [place]. She does not have elevated liver function tests or creatinine. I was not able to see a TSH. This was in response to her complaints of generalized itching and a itch/scratch cycle. I also noted that she is on OxyContin and oxycodone and of course narcotics can cause generalized pruritus as a side effect Objective Constitutional Sitting or standing Blood Pressure is within target range for patient.. Pulse regular and within target range for patient.Marland Kitchen Respirations regular, non-labored and within target range.. Temperature is normal and within the target range for the patient.Marland Kitchen Appears in no distress. Vitals Time Taken: 8:40 AM, Height: 62 in, Source:  Stated, Weight: 300 lbs, Source: Stated, BMI: 54.9, Temperature: 98.3 F, Pulse: 102 bpm, Respiratory Rate: 20 breaths/min, Blood Pressure: 120/54 mmHg, Capillary Blood Glucose: 198 mg/dl. General Notes: glucose per pt report this am Respiratory work of breathing is normal. Cardiovascular Pedal pulses are easily palpable on the right. Psychiatric appears at normal baseline. General Notes: Wound exam; right heel has 1 open area I think things might of coalesced. There is tenderness above this and some bogginess. Tissue at the base of this does not look unhealthy. There is no purulent drainage and nothing I really felt would benefit from culturing. ooHealed abdominal wounds. There is still 1 open area which looks healthy. This is on the right lower lateral abdomen. No debridement was required in either area. Integumentary (Hair, Skin) Some erythema and tenderness around the wound on the tip of her heel. Also some measurable depth. Wound #1 status is Open. Original cause of wound was Blister. The wound is located on the Right Calcaneus. The wound measures 0.9cm length x 1.5cm width x 0.1cm depth; 1.06cm^2 area and 0.106cm^3 volume. There is Fat Layer (Subcutaneous Tissue) Exposed exposed. There is no tunneling or undermining noted. There is a small amount of serosanguineous drainage noted. The wound margin is flat and  intact. There is medium (34-66%) pink granulation within the wound bed. There is a medium (34-66%) amount of necrotic tissue within the wound bed including Adherent Slough. Wound #5R status is Open. Original cause of wound was Gradually Appeared. The wound is located on the Right,Lateral Abdomen - Lower Quadrant. The wound measures 0.5cm length x 0.7cm width x 0.1cm depth; 0.275cm^2 area and 0.027cm^3 volume. There is Fat Layer (Subcutaneous Tissue) Exposed exposed. There is no tunneling or undermining noted. There is a small amount of serosanguineous drainage noted. The wound  margin is flat and intact. There is medium (34-66%) pink granulation within the wound bed. There is no necrotic tissue within the wound bed. Assessment Active Problems ICD-10 Type 2 diabetes mellitus with foot ulcer Lymphedema, not elsewhere classified Non-pressure chronic ulcer of right heel and midfoot with fat layer exposed Non-pressure chronic ulcer of other part of right lower leg with fat layer exposed Non-pressure chronic ulcer of skin of other sites with fat layer exposed Chronic combined systolic (congestive) and diastolic (congestive) heart failure Essential (primary) hypertension Procedures Wound #1 Pre-procedure diagnosis of Wound #1 is a Diabetic Wound/Ulcer of the Lower Extremity located on the Right Calcaneus . There was a Four Layer Compression Therapy Procedure by Carlene Coria, RN. Post procedure Diagnosis Wound #1: Same as Pre-Procedure Plan Follow-up Appointments: Return Appointment in 1 week. Skin Barriers/Peri-Wound Care: TCA Cream or Ointment Wound Cleansing: May shower with protection. Primary Wound Dressing: Wound #1 Right Calcaneus: Calcium Alginate with Silver Wound #5R Right,Lateral Abdomen - Lower Quadrant: Hydrofera Blue Secondary Dressing: Wound #1 Right Calcaneus: Dry Gauze Heel Cup Wound #5R Right,Lateral Abdomen - Lower Quadrant: Dry Gauze - or ABD pad Edema Control: 4 layer compression - Right Lower Extremity Avoid standing for long periods of time Elevate legs to the level of the heart or above for 30 minutes daily and/or when sitting, a frequency of: - whenever sitting Exercise regularly Support Garment 20-30 mm/Hg pressure to: - wear compression stocking to left lower leg. moisturize leg with lotion at night after stocking removed. Apply stocking first thing in the morning and remove before bed. Off-Loading: Open toe surgical shoe to: - to both feet. 1. Continue with silver alginate to the right calcaneus 2. I elected to put her on  some antibiotic cephalexin 500 every 8 hours for 7 dayso Cellulitis 3. We did an x-ray about 5 weeks ago which was negative. 4. If this continues to be excessively painful we may need to consider a repeat x-ray or up to including an MRI Electronic Signature(s) Signed: 09/09/2019 6:15:30 PM By: Linton Ham MD Entered By: Linton Ham on 09/09/2019 09:14:36 -------------------------------------------------------------------------------- SuperBill Details Patient Name: Date of Service: Pant, Deshanda M. 09/09/2019 Medical Record [id number] Patient Account Number: [id number] Date of Birth/Sex: Treating RN: 02-22-1961 (58 y.o. 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 L97.812 Non-pressure chronic ulcer of other part of right lower leg with fat layer exposed L98.492 Non-pressure chronic ulcer of skin of other sites with fat layer exposed I50.42 Chronic combined systolic (congestive) and diastolic (congestive) heart failure I10 Essential (primary) hypertension Facility Procedures CPT4 Code Description: [id number] (Facility Use Only) [phone] - APPLY MULTLAY COMPRS LWR RT LEG Modifier: Quantity: 1 Physician Procedures CPT4 Code Description: [id number]  [id number] - WC PHYS LEVEL 3 - EST PT ICD-10 Diagnosis Description L97.412 Non-pressure chronic ulcer of right heel and midfoot with E11.621 Type 2 diabetes mellitus with foot ulcer Modifier: fat layer expo Quantity: 1 sed Electronic Signature(s) Signed: 09/09/2019 6:15:30 PM By: Linton Ham MD Entered By: Linton Ham on 09/09/2019 09:15:36

## 2019-09-11 NOTE — Progress Notes (Signed)
[name], [name] ([id number]) Visit Report for 09/09/2019 Arrival Information Details Patient Name: Date of Service: [name], [name] 09/09/2019 8:30 AM Medical Record [id number] Patient Account Number: [id number] Date of Birth/Sex: 1961-08-06 (58 y.o. Female) Treating RN: Baruch Gouty Primary Care Provider: [name] Other Clinician: Referring Provider: Treating Provider/Extender:Robson, Arlis Porta, Archie Patten in Treatment: 6 Visit Information History Since Last Visit Added or deleted any medications: No Patient Arrived: Gilford Rile Any new allergies or adverse No Arrival Time: 08:35 reactions: Accompanied By: spouse Had a fall or experienced change in No Transfer Assistance: None activities of daily living that may affect Patient Identification Verified: Yes risk of falls: Secondary Verification Process Yes Signs or symptoms of abuse/neglect No Completed: since last visito Patient Requires Transmission- No Hospitalized since last visit: No Based Precautions: Implantable device outside of the No Patient Has Alerts: Yes clinic excluding Patient Alerts: Patient on Blood cellular tissue based products placed Thinner in the center since last visit: Has Dressing in Place as Prescribed: Yes Has Footwear/Offloading in Place as Yes Prescribed: Right: Other:globoped heel offloader Pain Present Now: Yes Electronic Signature(s) Signed: 09/10/2019 5:40:33 PM By: Baruch Gouty RN, BSN Entered By: Baruch Gouty on 09/09/2019 08:38:54 -------------------------------------------------------------------------------- Compression Therapy Details Patient Name: Date of Service: [name], [name] 09/09/2019 8:30 AM Medical Record [id number] Patient Account Number: [id number] Date of Birth/Sex: March 16, 1961 (58 y.o. Female) Treating RN: Carlene Coria Primary Care Provider: [name] Other Clinician: Referring Provider: Treating  Provider/Extender:Robson, Arlis Porta, Archie Patten in Treatment: 6 Compression Therapy Performed for Wound Wound #1 Right Calcaneus Assessment: Performed By: Clinician Carlene Coria, RN Compression Type: Four Layer Post Procedure Diagnosis Same as Pre-procedure Electronic Signature(s) Signed: 09/09/2019 5:43:27 PM By: Carlene Coria RN Entered By: Carlene Coria on 09/09/2019 09:03:09 -------------------------------------------------------------------------------- Encounter Discharge Information Details Patient Name: Date of Service: [name], [name] 09/09/2019 8:30 AM Medical Record [id number] Patient Account Number: [id number] Date of Birth/Sex: 01-27-1961 (58 y.o. Female) Treating RN: Levan Hurst Primary Care Provider: [name] Other Clinician: Referring Provider: Treating Provider/Extender:Robson, Arlis Porta, Archie Patten in Treatment: 6 Encounter Discharge Information Items Discharge Condition: Stable Ambulatory Status: Walker Discharge Destination: Home Transportation: Private Auto Accompanied By: husband Schedule Follow-up Appointment: Yes Clinical Summary of Care: Patient Declined Electronic Signature(s) Signed: 09/11/2019 3:10:57 PM By: Levan Hurst RN, BSN Entered By: Levan Hurst on 09/09/2019 09:30:00 -------------------------------------------------------------------------------- Lower Extremity Assessment Details Patient Name: Date of Service: [name], [name] 09/09/2019 8:30 AM Medical Record [id number] Patient Account Number: [id number] Date of Birth/Sex: [birth year] (58 y.o. Female) Treating RN: Baruch Gouty Primary Care Provider: [name] Other Clinician: Referring Provider: Treating Provider/Extender:Robson, Arlis Porta, Archie Patten in Treatment: 6 Edema Assessment Assessed: [Left: No] [Right: No] Edema: [Left: Ye] [Right: s] Calf Left: Right: Point of Measurement: 35 cm From Medial Instep  cm 40 cm Ankle Left: Right: Point of Measurement: 10 cm From Medial Instep cm 21.4 cm Vascular Assessment Pulses: Dorsalis Pedis Palpable: [Right:Yes] Electronic Signature(s) Signed: 09/10/2019 5:40:33 PM By: Baruch Gouty RN, BSN Entered By: Baruch Gouty on 09/09/2019 08:48:10 -------------------------------------------------------------------------------- Multi Wound Chart Details Patient Name: Date of Service: [name], [name] 09/09/2019 8:30 AM Medical Record [id number] Patient Account Number: [id number] Date of Birth/Sex: 1960/09/29 (58 y.o. 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] [N/A:N/A] Comorbid History: [1:Cataracts, Lymphedema, Cataracts, Lymphedema, Asthma, Congestive Heart Asthma, Congestive Heart Failure, Coronary Artery Disease, Hypertension, Type II Diabetes, Rheumatoid Arthritis, Neuropathy] [5R:Failure, Coronary Artery Disease,  Hypertension, Type II Diabetes, Rheumatoid Arthritis, Neuropathy] [N/A:N/A] Date Acquired: [1:09/11/2016] [5R:05/13/2019] [N/A:N/A] Weeks of Treatment: [1:6] [5R:6] [N/A:N/A] Wound Status: [1:Open] [5R:Open] [N/A:N/A] Wound Recurrence: [1:No] [5R:Yes] [N/A:N/A] Measurements L x W x D 0.9x1.5x0.1 [5R:0.5x0.7x0.1] [N/A:N/A] (cm) Area (cm) : [1:1.06] [5R:0.275] [N/A:N/A] Volume (cm) : [1:0.106] [5R:0.027]  [N/A:N/A] % Reduction in Area: [1:52.80%] [5R:70.80%] [N/A:N/A] % Reduction in Volume: 76.40% [5R:71.30%] [N/A:N/A] Classification: [1:Grade 2] [5R:Full Thickness Without Exposed Support Structures] [N/A:N/A] Exudate Amount: [1:Small] [5R:Small] [N/A:N/A] Exudate Type: [1:Serosanguineous] [5R:Serosanguineous] [N/A:N/A] Exudate Color: [1:red, brown] [5R:red, brown] [N/A:N/A] Wound Margin: [1:Flat and Intact] [5R:Flat and Intact] [N/A:N/A] Granulation Amount: [1:Medium (34-66%)] [5R:Medium (34-66%)] [N/A:N/A] Granulation Quality: [1:Pink] [5R:Pink] [N/A:N/A] Necrotic Amount: [1:Medium (34-66%)] [5R:None Present (0%)] [N/A:N/A] Exposed Structures: [1:Fat Layer (Subcutaneous Tissue) Exposed: Yes Fascia: No Tendon: No Muscle: No Joint: No Bone: No] [5R:Fat Layer (Subcutaneous N/A Tissue) Exposed: Yes Fascia: No Tendon: No Muscle: No Joint: No Bone: No] Epithelialization: [1:Small (1-33%) Compression Therapy] [5R:Medium (34-66%) N/A] [N/A:N/A N/A] Treatment Notes Electronic Signature(s) Signed: 09/09/2019 6:15:30 PM By: Linton Ham MD Entered By: Linton Ham on 09/09/2019 09:05:45 -------------------------------------------------------------------------------- Multi-Disciplinary Care Plan Details Patient Name: Date of Service: [name], [name] 09/09/2019 8:30 AM Medical Record [id number] Patient Account Number: [id number] Date of Birth/Sex: 09/09/1961 (58 y.o. Female) Treating RN: Carlene Coria Primary Care Clifton Safley: [name] Other Clinician: Referring Oshay Stranahan: Treating Charde Macfarlane/Extender:Robson, Arlis Porta, Archie Patten in Treatment: 6 Active Inactive Pressure Nursing Diagnoses: Knowledge deficit related to causes and risk factors for pressure ulcer development Potential for impaired tissue integrity related to pressure, friction, moisture, and shear Goals: Patient/caregiver will verbalize understanding of pressure ulcer management Date Initiated:  07/23/2019 Target Resolution Date: 09/19/2019 Goal Status: Active Interventions: Assess offloading mechanisms upon admission and as needed Notes: Venous Leg Ulcer Nursing Diagnoses: Knowledge deficit related to disease process and management Potential for venous Insuffiency (use before diagnosis confirmed) Goals: Patient will maintain optimal edema control Date Initiated: 07/23/2019 Target Resolution Date: 09/19/2019 Goal Status: Active Interventions: Assess peripheral edema status every visit. Compression as ordered Treatment Activities: Therapeutic compression applied : 07/23/2019 Notes: Wound/Skin Impairment Nursing Diagnoses: Impaired tissue integrity Knowledge deficit related to ulceration/compromised skin integrity Goals: Patient/caregiver will verbalize understanding of skin care regimen Date Initiated: 07/23/2019 Date Inactivated: 08/14/2019 Target Resolution Date: 08/20/2019 Goal Status: Met Ulcer/skin breakdown will have a volume reduction of 30% by week 4 Date Initiated: 07/23/2019 Target Resolution Date: 09/19/2019 Goal Status: Active Interventions: Assess patient/caregiver ability to obtain necessary supplies Assess patient/caregiver ability to perform ulcer/skin care regimen upon admission and as needed Assess ulceration(s) every visit Treatment Activities: Skin care regimen initiated : 07/23/2019 Topical wound management initiated : 07/23/2019 Notes: Electronic Signature(s) Signed: 09/09/2019 5:43:27 PM By: Carlene Coria RN Entered By: Carlene Coria on 09/09/2019 08:33:20 -------------------------------------------------------------------------------- Pain Assessment Details Patient Name: Date of Service: [name], [name] 09/09/2019 8:30 AM Medical Record [id number] Patient Account Number: [id number] Date of Birth/Sex: [birth year] (58 y.o. Female) Treating RN: Baruch Gouty Primary Care Christia Domke: [name] Other Clinician: Referring Artemus Romanoff:  Treating Khaleelah Yowell/Extender:Robson, Arlis Porta, Archie Patten in Treatment: 6 Active Problems Location of Pain Severity and Description of Pain Patient Has Paino Yes Site Locations Pain Location: Pain in Ulcers With Dressing Change: Yes Duration of  the Pain. Constant / Intermittento Intermittent Rate the pain. Current Pain Level: 5 Worst Pain Level: 7 Least Pain Level: 0 Character of Pain Describe the Pain: Aching, Tender Pain Management and Medication Current Pain Management: Medication: Yes Is the Current Pain Management Adequate: Adequate Rest: Yes How does your wound impact your activities of daily livingo Sleep: No Bathing: No Appetite: No Relationship With Others: No Bladder Continence: No Emotions: No Bowel Continence: No Drive: No Toileting: No Hobbies: Yes Dressing: No Electronic Signature(s) Signed: 09/10/2019 5:40:33 PM By: Baruch Gouty RN, BSN Entered By: Baruch Gouty on 09/09/2019 08:47:59 -------------------------------------------------------------------------------- Patient/Caregiver Education Details Patient Name: [name], [name] 12/29/2020andnbsp8:30 Date of Service: AM Medical Record [id number] Number: Patient Account Number: [id number] Treating RN: Date of Birth/Gender: August 18, 1961 (58 y.o. Carlene Coria Female) Other Clinician: Primary Care Treating Wendling, Sherlynn Carbon Physician: Physician/Extender: Referring Physician: Tonny Bollman in Treatment: 6 Education Assessment Education Provided To: Patient Education Topics Provided Wound/Skin Impairment: Methods: Explain/Verbal Responses: State content correctly Electronic Signature(s) Signed: 09/09/2019 5:43:27 PM By: Carlene Coria RN Entered By: Carlene Coria on 09/09/2019 08:33:35 -------------------------------------------------------------------------------- Wound Assessment Details Patient Name: Date of Service: [name], [name] 09/09/2019 8:30  AM Medical Record [id number] Patient Account Number: [id number] Date of Birth/Sex: [birth year] (58 y.o. Female) Treating RN: Baruch Gouty Primary Care Ceil Roderick: [name] Other Clinician: Referring Quavion Boule: Treating Delmus Warwick/Extender:Robson, Arlis Porta, Archie Patten in Treatment: 6 Wound Status Wound Number: 1 Primary Diabetic Wound/Ulcer of the Lower Extremity Etiology: Wound Location: Right Calcaneus Wound Open Wounding Event: Blister Status: Date Acquired: 09/11/2016 Comorbid Cataracts, Lymphedema, Asthma, Congestive Weeks Of Treatment: 6 History: Heart Failure, Coronary Artery Disease, Clustered Wound: No Hypertension, Type II Diabetes, Rheumatoid Arthritis, Neuropathy Photos Wound Measurements Length: (cm) 0.9 Width: (cm) 1.5 Depth: (cm) 0.1 Area: (cm) 1.06 Volume: (cm) 0.106 Wound Description Classification: Grade 2 Wound Margin: Flat and Intact Exudate Amount: Small Exudate Type: Serosanguineous Exudate Color: red, brown Wound Bed Granulation Amount: Medium (34-66%) Granulation Quality: Pink Necrotic Amount: Medium (34-66%) Necrotic Quality: Adherent Slough fter Cleansing: No ino Yes Exposed Structure ed: No ubcutaneous Tissue) Exposed: Yes ed: No ed: No d: No : No % Reduction in Area: 52.8% % Reduction in Volume: 76.4% Epithelialization: Small (1-33%) Tunneling: No Undermining: No Foul Odor A Slough/Fibr Fascia Expos Fat Layer (S Tendon Expos Muscle Expos Joint Expose Bone Exposed Treatment Notes Wound #1 (Right Calcaneus) 1. Cleanse With Soap and water 2. Periwound Care TCA Ointment 3. Primary Dressing Applied Calcium Alginate Ag 4. Secondary Dressing Dry Gauze Heel Cup 6. Support Layer Applied 4 layer compression Water quality scientist) Signed: 09/10/2019 3:39:15 PM By: Mikeal Hawthorne EMT/HBOT Signed: 09/10/2019 5:40:33 PM By: Baruch Gouty RN, BSN Entered By: Mikeal Hawthorne on 09/10/2019  11:06:48 -------------------------------------------------------------------------------- Wound Assessment Details Patient Name: Date of Service: [name], [name] 09/09/2019 8:30 AM Medical Record [id number] Patient Account Number: [id number] Date of Birth/Sex: 02/14/1961 (58 y.o. Female) Treating RN: Baruch Gouty Primary Care Taitum Alms: [name] Other Clinician: Referring Tarvares Lant: Treating Ayeisha Lindenberger/Extender:Robson, Arlis Porta, Archie Patten in Treatment: 6 Wound Status Wound Number: 5R Primary Atypical Etiology: Wound Location: Right Abdomen - Lower Quadrant - Lateral Wound Open Status: Wounding Event: Gradually Appeared Comorbid Cataracts, Lymphedema, Asthma, Congestive Date Acquired: 05/13/2019 History: Heart Failure, Coronary Artery Disease, Weeks Of Treatment: 6 Hypertension, Type II Diabetes, Rheumatoid Clustered Wound: No Arthritis, Neuropathy Photos Wound Measurements Length: (cm) 0.5 % Reduct Width: (cm) 0.7 % Reduct Depth: (cm) 0.1 Epitheli Area: (cm) 0.275 Tunneli Volume: (cm) 0.027 Undermi Wound Description Classification: Full Thickness Without  Exposed Support Foul Odo Structures Slough/F Wound Flat and Intact Margin: Exudate Small Amount: Exudate Serosanguineous Type: Exudate red, brown Color: Wound Bed Granulation Amount: Medium (34-66%) Granulation Quality: Pink Fascia E Necrotic Amount: None Present (0%) Fat Laye Tendon E Muscle E Joint E Bone Ex r After Cleansing: No ibrino Yes Exposed Structure xposed: No r (Subcutaneous Tissue) Exposed: Yes xposed: No xposed: No xposed: No posed: No ion in Area: 70.8% ion in Volume: 71.3% alization: Medium (34-66%) ng: No ning: No Treatment Notes Wound #5R (Right, Lateral Abdomen - Lower Quadrant) 1. Cleanse With Wound Cleanser 3. Primary Dressing Applied Hydrofera Blue 4. Secondary Dressing Foam Border Dressing Electronic Signature(s) Signed: 09/10/2019 3:39:15  PM By: Mikeal Hawthorne EMT/HBOT Signed: 09/10/2019 5:40:33 PM By: Baruch Gouty RN, BSN Entered By: Mikeal Hawthorne on 09/10/2019 11:07:08 -------------------------------------------------------------------------------- Vitals Details Patient Name: Date of Service: [name], [name] 09/09/2019 8:30 AM Medical Record [id number] Patient Account Number: [id number] Date of Birth/Sex: [birth year] (58 y.o. Female) Treating RN: Baruch Gouty Primary Care Faraaz Wolin: [name] Other Clinician: Referring Jhonatan Lomeli: Treating Teon Hudnall/Extender:Robson, Arlis Porta, Archie Patten in Treatment: 6 Vital Signs Time Taken: 08:40 Temperature (F): 98.3 Height (in): 62 Pulse (bpm): 102 Source: Stated Respiratory Rate (breaths/min): 20 Weight (lbs): 300 Blood Pressure (mmHg): 120/54 Source: Stated Capillary Blood Glucose (mg/dl): 198 Body Mass Index (BMI): 54.9 Reference Range: 80 - 120 mg / dl Notes glucose per pt report this am Electronic Signature(s) Signed: 09/10/2019 5:40:33 PM By: Baruch Gouty RN, BSN Entered By: Baruch Gouty on 09/09/2019 08:41:19

## 2019-09-16 ENCOUNTER — Encounter (HOSPITAL_BASED_OUTPATIENT_CLINIC_OR_DEPARTMENT_OTHER): Payer: BC Managed Care – PPO | Admitting: Internal Medicine

## 2019-11-06 ENCOUNTER — Other Ambulatory Visit: Payer: Self-pay | Admitting: Family Medicine

## 2019-11-06 DIAGNOSIS — E559 Vitamin D deficiency, unspecified: Secondary | ICD-10-CM

## 2019-11-08 ENCOUNTER — Other Ambulatory Visit: Payer: Self-pay | Admitting: Family Medicine

## 2019-11-08 DIAGNOSIS — J45909 Unspecified asthma, uncomplicated: Secondary | ICD-10-CM

## 2019-11-11 ENCOUNTER — Telehealth: Payer: Self-pay | Admitting: *Deleted

## 2019-11-11 DIAGNOSIS — E559 Vitamin D deficiency, unspecified: Secondary | ICD-10-CM

## 2019-11-11 NOTE — Telephone Encounter (Signed)
That's fine, plz make a note to reck on 3/5 during her appt. Ty.

## 2019-11-11 NOTE — Telephone Encounter (Signed)
[facility] sent over a request for vit d 50000.  Last filled and checked back in 2018.  Do you want to refill?

## 2019-11-13 NOTE — Addendum Note (Signed)
Addended by: Thelma Barge D on: 11/13/2019 11:46 AM   Modules accepted: Orders

## 2019-11-13 NOTE — Telephone Encounter (Signed)
Appointment was canceled.  Advised pharmacy that appointment will be needed.

## 2019-11-14 ENCOUNTER — Ambulatory Visit: Payer: BC Managed Care – PPO | Admitting: Family Medicine

## 2019-11-25 ENCOUNTER — Ambulatory Visit: Payer: Self-pay

## 2019-12-08 ENCOUNTER — Other Ambulatory Visit: Payer: Self-pay | Admitting: Family Medicine

## 2019-12-08 MED ORDER — METFORMIN HCL ER 500 MG PO TB24: 1000.0000 mg | ORAL_TABLET | Freq: Two times a day (BID) | ORAL | 0 refills | Status: DC

## 2019-12-10 MED ORDER — METFORMIN HCL ER 500 MG PO TB24: 1000.0000 mg | ORAL_TABLET | Freq: Two times a day (BID) | ORAL | 1 refills | Status: DC

## 2019-12-17 MED ORDER — METFORMIN HCL ER 500 MG PO TB24: 1000.0000 mg | ORAL_TABLET | Freq: Two times a day (BID) | ORAL | 1 refills | Status: DC

## 2019-12-17 NOTE — Addendum Note (Signed)
Addended by: Scharlene Gloss B on: 12/17/2019 08:32 AM   Modules accepted: Orders

## 2019-12-24 ENCOUNTER — Ambulatory Visit (INDEPENDENT_AMBULATORY_CARE_PROVIDER_SITE_OTHER): Payer: 59 | Admitting: Family Medicine

## 2019-12-24 ENCOUNTER — Other Ambulatory Visit: Payer: Self-pay

## 2019-12-24 ENCOUNTER — Encounter: Payer: Self-pay | Admitting: Family Medicine

## 2019-12-24 VITALS — BP 130/63 | Temp 99.2°F

## 2019-12-24 DIAGNOSIS — E559 Vitamin D deficiency, unspecified: Secondary | ICD-10-CM

## 2019-12-24 DIAGNOSIS — B9689 Other specified bacterial agents as the cause of diseases classified elsewhere: Secondary | ICD-10-CM

## 2019-12-24 DIAGNOSIS — J208 Acute bronchitis due to other specified organisms: Secondary | ICD-10-CM | POA: Diagnosis not present

## 2019-12-24 MED ORDER — FLUCONAZOLE 150 MG PO TABS: ORAL_TABLET | ORAL | 0 refills | Status: DC

## 2019-12-24 MED ORDER — PROMETHAZINE-DM 6.25-15 MG/5ML PO SYRP: 5.0000 mL | ORAL_SOLUTION | Freq: Four times a day (QID) | ORAL | 0 refills | Status: DC | PRN

## 2019-12-24 MED ORDER — VITAMIN D (ERGOCALCIFEROL) 1.25 MG (50000 UNIT) PO CAPS: 50000.0000 [IU] | ORAL_CAPSULE | ORAL | 3 refills | Status: DC

## 2019-12-24 MED ORDER — PREDNISONE 20 MG PO TABS: 40.0000 mg | ORAL_TABLET | Freq: Every day | ORAL | 0 refills | Status: AC

## 2019-12-24 MED ORDER — DOXYCYCLINE HYCLATE 100 MG PO TABS: 100.0000 mg | ORAL_TABLET | Freq: Two times a day (BID) | ORAL | 0 refills | Status: AC

## 2019-12-24 NOTE — Progress Notes (Addendum)
Chief Complaint  Patient presents with  . Sinusitis  . Cough    [name] here for URI complaints. Due to COVID-19 pandemic, we are interacting via telephone. I verified patient's ID using 2 identifiers. Patient agreed to proceed with visit via this method. Patient is at home, I am at office. Patient and I are present for visit.   Duration: 5 days  Associated symptoms: fevers (102 F max), sinus congestion, sinus pain, rhinorrhea, itchy watery eyes, ear pain, wheezing, shortness of breath, myalgia and productive cough Denies: ear drainage, sore throat and chest tightness Treatment to date: Sudafed, Benadryl, Coricidin, Xyzal, Mucinex, DM Sick contacts: Yes- spouse  Past Medical History:  Diagnosis Date  . Asthma    "on daily RX and rescue inhaler" (04/18/2018)  . Chronic diastolic CHF (congestive heart failure) (HCC) 09/2015  . Chronic lower back pain   . Chronic neck pain   . Chronic pain syndrome    Fentanyl Patch  . Colon polyps   . Coronary artery disease    cath with normal LM, 30% LAD, 85% mid RCA and 95% distal RCA s/p PCI of the mid to distal RCA and now on DAPT with ASA and Ticagrelor.    . Eczema   . Excessive daytime sleepiness 11/26/2015  . Fibromyalgia   . Gallstones   . GERD (gastroesophageal reflux disease)   . Heart murmur    "noted for the 1st time on 04/18/2018"  . History of blood transfusion 07/2010   "S/P oophorectomy"  . History of gout   . History of hiatal hernia 1980s   "gone now" (04/18/2018)  . Hyperlipidemia   . Hypertension    takes Metoprolol and Enalapril daily  . Hypothyroidism    takes Synthroid daily  . IBS (irritable bowel syndrome)   . Migraine    "nothing in the 2000s" (04/18/2018)  . Mixed connective tissue disease (HCC)   . NAFLD (nonalcoholic fatty liver disease)   . Pneumonia    "several times" (04/18/2018)  . Rheumatoid arthritis (HCC)    "hands, elbows, shoulders, probably knees" (04/18/2018)  . Scoliosis   . Spondylosis   . Type  II diabetes mellitus (HCC)    takes Metformin and Hum R daily (04/18/2018)    BP 130/63 (BP Location: Left Arm, Patient Position: Sitting, Cuff Size: Normal)   Temp 99.2 F (37.3 C) (Temporal)  No conversational dyspnea Age appropriate judgment and insight Nml affect and mood  Acute bacterial bronchitis - Plan: promethazine-dextromethorphan (PROMETHAZINE-DM) 6.25-15 MG/5ML syrup, doxycycline (VIBRA-TABS) 100 MG tablet, predniSONE (DELTASONE) 20 MG tablet, fluconazole (DIFLUCAN) 150 MG tablet  Orders as above. Doxy given fever. Diflucan as she has yeast infections with them at times.  Continue to push fluids, practice good hand hygiene, cover mouth when coughing. F/u prn. If starting to experience increasing fevers, shaking, or continued/worsening shortness of breath, seek immediate care. Total time : 11 min Pt voiced understanding and agreement to the plan.  Jilda Roche [place], DO 12/24/19 10:35 AM

## 2019-12-25 ENCOUNTER — Other Ambulatory Visit: Payer: Self-pay | Admitting: Family Medicine

## 2019-12-30 ENCOUNTER — Other Ambulatory Visit: Payer: Self-pay

## 2019-12-31 ENCOUNTER — Telehealth (INDEPENDENT_AMBULATORY_CARE_PROVIDER_SITE_OTHER): Payer: 59 | Admitting: Family Medicine

## 2019-12-31 ENCOUNTER — Encounter: Payer: Self-pay | Admitting: Family Medicine

## 2019-12-31 VITALS — BP 142/67 | Temp 99.0°F

## 2019-12-31 DIAGNOSIS — B9689 Other specified bacterial agents as the cause of diseases classified elsewhere: Secondary | ICD-10-CM | POA: Diagnosis not present

## 2019-12-31 DIAGNOSIS — J208 Acute bronchitis due to other specified organisms: Secondary | ICD-10-CM

## 2019-12-31 MED ORDER — PREDNISONE 20 MG PO TABS: ORAL_TABLET | ORAL | 0 refills | Status: DC

## 2019-12-31 MED ORDER — AZITHROMYCIN 250 MG PO TABS: ORAL_TABLET | ORAL | 0 refills | Status: DC

## 2019-12-31 MED ORDER — FLUCONAZOLE 150 MG PO TABS: ORAL_TABLET | ORAL | 0 refills | Status: DC

## 2019-12-31 MED ORDER — ALBUTEROL SULFATE (2.5 MG/3ML) 0.083% IN NEBU: 2.5000 mg | INHALATION_SOLUTION | Freq: Four times a day (QID) | RESPIRATORY_TRACT | 1 refills | Status: DC | PRN

## 2019-12-31 MED ORDER — BENZONATATE 100 MG PO CAPS: 100.0000 mg | ORAL_CAPSULE | Freq: Three times a day (TID) | ORAL | 0 refills | Status: DC | PRN

## 2019-12-31 NOTE — Progress Notes (Signed)
Chief Complaint  Patient presents with  . Wheezing  . Cough  . Sinusitis    [name] here for URI complaints. Due to COVID-19 pandemic, we are interacting via web portal for an electronic face-to-face visit. I verified patient's ID using 2 identifiers. Patient agreed to proceed with visit via this method. Patient is at home, I am at office. Patient and I are present for visit.   Seen 1 week ago and rx'd pred burst and 7 d of doxy. Reports still having wheezing, sob, sinus pressure/pain, nasal drainage, coughing. She has required longer courses of steroids and abx in past. Improved but has worsened after stopping therapy in the past. +hx of asthma.   Past Medical History:  Diagnosis Date  . Asthma    "on daily RX and rescue inhaler" (04/18/2018)  . Chronic diastolic CHF (congestive heart failure) (HCC) 09/2015  . Chronic lower back pain   . Chronic neck pain   . Chronic pain syndrome    Fentanyl Patch  . Colon polyps   . Coronary artery disease    cath with normal LM, 30% LAD, 85% mid RCA and 95% distal RCA s/p PCI of the mid to distal RCA and now on DAPT with ASA and Ticagrelor.    . Eczema   . Excessive daytime sleepiness 11/26/2015  . Fibromyalgia   . Gallstones   . GERD (gastroesophageal reflux disease)   . Heart murmur    "noted for the 1st time on 04/18/2018"  . History of blood transfusion 07/2010   "S/P oophorectomy"  . History of gout   . History of hiatal hernia 1980s   "gone now" (04/18/2018)  . Hyperlipidemia   . Hypertension    takes Metoprolol and Enalapril daily  . Hypothyroidism    takes Synthroid daily  . IBS (irritable bowel syndrome)   . Migraine    "nothing in the 2000s" (04/18/2018)  . Mixed connective tissue disease (HCC)   . NAFLD (nonalcoholic fatty liver disease)   . Pneumonia    "several times" (04/18/2018)  . Rheumatoid arthritis (HCC)    "hands, elbows, shoulders, probably knees" (04/18/2018)  . Scoliosis   . Spondylosis   . Type II diabetes  mellitus (HCC)    takes Metformin and Hum R daily (04/18/2018)   Exam No conversational dyspnea Age appropriate judgment and insight Nml affect and mood  Acute bacterial bronchitis - Plan: azithromycin (ZITHROMAX) 250 MG tablet, predniSONE (DELTASONE) 20 MG tablet, albuterol (PROVENTIL) (2.5 MG/3ML) 0.083% nebulizer solution, fluconazole (DIFLUCAN) 150 MG tablet, benzonatate (TESSALON) 100 MG capsule   Zpak + Diflucan. Pred taper of 40 mg ->20 mg -> 10 mg, each for 4 days for a total of 12 days.  Tessalon Perles instead of syrup. Trial SABA alone neb.  Continue to push fluids, practice good hand hygiene, cover mouth when coughing. F/u prn. If starting to experience fevers, shaking, or shortness of breath, seek immediate care. Pt voiced understanding and agreement to the plan.  Jilda Roche [place], DO 12/31/19 2:22 PM

## 2020-01-13 ENCOUNTER — Other Ambulatory Visit: Payer: Self-pay | Admitting: Family Medicine

## 2020-01-13 MED ORDER — CIPROFLOXACIN HCL 500 MG PO TABS: 500.0000 mg | ORAL_TABLET | Freq: Two times a day (BID) | ORAL | 0 refills | Status: AC

## 2020-01-14 ENCOUNTER — Other Ambulatory Visit: Payer: Self-pay | Admitting: Family Medicine

## 2020-01-14 DIAGNOSIS — B9689 Other specified bacterial agents as the cause of diseases classified elsewhere: Secondary | ICD-10-CM

## 2020-01-14 NOTE — Telephone Encounter (Signed)
refill 

## 2020-01-20 ENCOUNTER — Other Ambulatory Visit: Payer: Self-pay | Admitting: Family Medicine

## 2020-01-20 DIAGNOSIS — S91301A Unspecified open wound, right foot, initial encounter: Secondary | ICD-10-CM

## 2020-01-20 NOTE — Progress Notes (Signed)
am

## 2020-01-21 ENCOUNTER — Telehealth (INDEPENDENT_AMBULATORY_CARE_PROVIDER_SITE_OTHER): Payer: 59 | Admitting: Family Medicine

## 2020-01-21 ENCOUNTER — Emergency Department (HOSPITAL_BASED_OUTPATIENT_CLINIC_OR_DEPARTMENT_OTHER): Payer: 59

## 2020-01-21 ENCOUNTER — Ambulatory Visit: Payer: 59 | Admitting: Family Medicine

## 2020-01-21 ENCOUNTER — Encounter (HOSPITAL_BASED_OUTPATIENT_CLINIC_OR_DEPARTMENT_OTHER): Payer: Self-pay | Admitting: Emergency Medicine

## 2020-01-21 ENCOUNTER — Emergency Department (HOSPITAL_BASED_OUTPATIENT_CLINIC_OR_DEPARTMENT_OTHER)
Admission: EM | Admit: 2020-01-21 | Discharge: 2020-01-21 | Disposition: A | Discharge status: Home / Self Care | Payer: 59 | Attending: Emergency Medicine | Admitting: Emergency Medicine

## 2020-01-21 ENCOUNTER — Telehealth: Payer: 59 | Admitting: Family Medicine

## 2020-01-21 ENCOUNTER — Other Ambulatory Visit: Payer: Self-pay

## 2020-01-21 DIAGNOSIS — R509 Fever, unspecified: Secondary | ICD-10-CM | POA: Diagnosis not present

## 2020-01-21 DIAGNOSIS — I5032 Chronic diastolic (congestive) heart failure: Secondary | ICD-10-CM | POA: Insufficient documentation

## 2020-01-21 DIAGNOSIS — R0602 Shortness of breath: Secondary | ICD-10-CM | POA: Diagnosis present

## 2020-01-21 DIAGNOSIS — Z7982 Long term (current) use of aspirin: Secondary | ICD-10-CM | POA: Insufficient documentation

## 2020-01-21 DIAGNOSIS — Y929 Unspecified place or not applicable: Secondary | ICD-10-CM | POA: Diagnosis not present

## 2020-01-21 DIAGNOSIS — Z79899 Other long term (current) drug therapy: Secondary | ICD-10-CM | POA: Diagnosis not present

## 2020-01-21 DIAGNOSIS — Y999 Unspecified external cause status: Secondary | ICD-10-CM | POA: Diagnosis not present

## 2020-01-21 DIAGNOSIS — S92354A Nondisplaced fracture of fifth metatarsal bone, right foot, initial encounter for closed fracture: Secondary | ICD-10-CM | POA: Insufficient documentation

## 2020-01-21 DIAGNOSIS — J45909 Unspecified asthma, uncomplicated: Secondary | ICD-10-CM | POA: Diagnosis not present

## 2020-01-21 DIAGNOSIS — R05 Cough: Secondary | ICD-10-CM | POA: Diagnosis not present

## 2020-01-21 DIAGNOSIS — Z20822 Contact with and (suspected) exposure to covid-19: Secondary | ICD-10-CM | POA: Insufficient documentation

## 2020-01-21 DIAGNOSIS — Z87891 Personal history of nicotine dependence: Secondary | ICD-10-CM | POA: Insufficient documentation

## 2020-01-21 DIAGNOSIS — Y939 Activity, unspecified: Secondary | ICD-10-CM | POA: Diagnosis not present

## 2020-01-21 DIAGNOSIS — I110 Hypertensive heart disease with heart failure: Secondary | ICD-10-CM | POA: Diagnosis not present

## 2020-01-21 DIAGNOSIS — Z794 Long term (current) use of insulin: Secondary | ICD-10-CM | POA: Insufficient documentation

## 2020-01-21 DIAGNOSIS — X58XXXA Exposure to other specified factors, initial encounter: Secondary | ICD-10-CM | POA: Diagnosis not present

## 2020-01-21 DIAGNOSIS — J45901 Unspecified asthma with (acute) exacerbation: Secondary | ICD-10-CM

## 2020-01-21 DIAGNOSIS — E119 Type 2 diabetes mellitus without complications: Secondary | ICD-10-CM | POA: Diagnosis not present

## 2020-01-21 DIAGNOSIS — S92356A Nondisplaced fracture of fifth metatarsal bone, unspecified foot, initial encounter for closed fracture: Secondary | ICD-10-CM

## 2020-01-21 LAB — CBC WITH DIFFERENTIAL/PLATELET
Abs Immature Granulocytes: 0.04 10*3/uL (ref 0.00–0.07)
Basophils Absolute: 0.1 10*3/uL (ref 0.0–0.1)
Basophils Relative: 1 %
Eosinophils Absolute: 0.5 10*3/uL (ref 0.0–0.5)
Eosinophils Relative: 5 %
HCT: 37.7 % (ref 36.0–46.0)
Hemoglobin: 11.4 g/dL — ABNORMAL LOW (ref 12.0–15.0)
Immature Granulocytes: 0 %
Lymphocytes Relative: 22 %
Lymphs Abs: 2 10*3/uL (ref 0.7–4.0)
MCH: 24.9 pg — ABNORMAL LOW (ref 26.0–34.0)
MCHC: 30.2 g/dL (ref 30.0–36.0)
MCV: 82.5 fL (ref 80.0–100.0)
Monocytes Absolute: 0.7 10*3/uL (ref 0.1–1.0)
Monocytes Relative: 8 %
Neutro Abs: 5.9 10*3/uL (ref 1.7–7.7)
Neutrophils Relative %: 64 %
Platelets: 369 10*3/uL (ref 150–400)
RBC: 4.57 MIL/uL (ref 3.87–5.11)
RDW: 17.8 % — ABNORMAL HIGH (ref 11.5–15.5)
WBC: 9.2 10*3/uL (ref 4.0–10.5)
nRBC: 0 % (ref 0.0–0.2)

## 2020-01-21 LAB — COMPREHENSIVE METABOLIC PANEL
ALT: 28 U/L (ref 0–44)
AST: 29 U/L (ref 15–41)
Albumin: 3.2 g/dL — ABNORMAL LOW (ref 3.5–5.0)
Alkaline Phosphatase: 66 U/L (ref 38–126)
Anion gap: 11 (ref 5–15)
BUN: 9 mg/dL (ref 6–20)
CO2: 24 mmol/L (ref 22–32)
Calcium: 8.8 mg/dL — ABNORMAL LOW (ref 8.9–10.3)
Chloride: 99 mmol/L (ref 98–111)
Creatinine, Ser: 0.99 mg/dL (ref 0.44–1.00)
GFR calc Af Amer: >60 mL/min (ref >60)
GFR calc non Af Amer: >60 mL/min (ref >60)
Glucose, Bld: 101 mg/dL — ABNORMAL HIGH (ref 70–99)
Potassium: 4.1 mmol/L (ref 3.5–5.1)
Sodium: 134 mmol/L — ABNORMAL LOW (ref 135–145)
Total Bilirubin: 0.6 mg/dL (ref 0.3–1.2)
Total Protein: 7.3 g/dL (ref 6.5–8.1)

## 2020-01-21 LAB — POCT I-STAT EG7
Acid-Base Excess: 2 mmol/L (ref 0.0–2.0)
Bicarbonate: 26.7 mmol/L (ref 20.0–28.0)
Calcium, Ion: 1.18 mmol/L (ref 1.15–1.40)
HCT: 37 % (ref 36.0–46.0)
Hemoglobin: 12.6 g/dL (ref 12.0–15.0)
O2 Saturation: 69 %
Patient temperature: 98.6
Potassium: 4 mmol/L (ref 3.5–5.1)
Sodium: 138 mmol/L (ref 135–145)
TCO2: 28 mmol/L (ref 22–32)
pCO2, Ven: 41.5 mmHg — ABNORMAL LOW (ref 44.0–60.0)
pH, Ven: 7.417 (ref 7.250–7.430)
pO2, Ven: 35 mmHg (ref 32.0–45.0)

## 2020-01-21 LAB — TROPONIN I (HIGH SENSITIVITY)
Troponin I (High Sensitivity): 7 ng/L (ref <18)
Troponin I (High Sensitivity): 7 ng/L (ref <18)

## 2020-01-21 LAB — LACTIC ACID, PLASMA
Lactic Acid, Venous: 2.6 mmol/L (ref 0.5–1.9)
Lactic Acid, Venous: 1.4 mmol/L (ref 0.5–1.9)

## 2020-01-21 LAB — SARS CORONAVIRUS 2 BY RT PCR (HOSPITAL ORDER, PERFORMED IN ~~LOC~~ HOSPITAL LAB): SARS Coronavirus 2: NEGATIVE

## 2020-01-21 LAB — BRAIN NATRIURETIC PEPTIDE: B Natriuretic Peptide: 7.8 pg/mL (ref 0.0–100.0)

## 2020-01-21 MED ORDER — ALBUTEROL SULFATE HFA 108 (90 BASE) MCG/ACT IN AERS
4.0000 | INHALATION_SPRAY | Freq: Once | RESPIRATORY_TRACT | Status: AC
  Administered 2020-01-21: 4 via RESPIRATORY_TRACT
  Filled 2020-01-21: qty 6.7

## 2020-01-21 MED ORDER — METHYLPREDNISOLONE SODIUM SUCC 125 MG IJ SOLR
125.0000 mg | Freq: Once | INTRAMUSCULAR | Status: AC
  Administered 2020-01-21: 125 mg via INTRAVENOUS
  Filled 2020-01-21: qty 2

## 2020-01-21 MED ORDER — MAGNESIUM SULFATE 50 % IJ SOLN
1.0000 g | Freq: Once | INTRAMUSCULAR | Status: AC
  Administered 2020-01-21: 1 g via INTRAVENOUS
  Filled 2020-01-21: qty 2

## 2020-01-21 NOTE — ED Provider Notes (Signed)
[facility] EMERGENCY DEPARTMENT Provider Note   CSN: [id number] Arrival date & time: 01/21/20  1620     History Chief Complaint  Patient presents with  . Shortness of Breath    Alicya M Carneiro is a 59 y.o. female with past medical history significant for HTN, HLD, CAD, type II DM, RA, and asthma who presents to the ED with a 4-week history of progressively worsening shortness of breath symptoms.  Patient reports that she has been provided 3 separate prednisone bursts in conjunction with 3 separate antibiotics.  She has received doxycycline, azithromycin, and ciprofloxacin over the course of her illness.  Her symptoms of failed to improve and for the past 2 days they have been even worse.  She is having significant difficulty getting any rest at home.  She has to sleep sitting up.  She has a history of lymphedema in her legs bilaterally and she has had new wounds to her foot in the past couple of weeks.  Her husband has been managing her dermatologic lesions.  She also reports having fevers intermittently, T-max 102 F, however she states that they come down well with Tylenol.  She endorses chest discomfort, but states that is due to her costochondritis.  Her chest pain is worse with coughing.    HPI     Past Medical History:  Diagnosis Date  . Asthma    "on daily RX and rescue inhaler" (04/18/2018)  . Chronic diastolic CHF (congestive heart failure) (HCC) 09/2015  . Chronic lower back pain   . Chronic neck pain   . Chronic pain syndrome    Fentanyl Patch  . Colon polyps   . Coronary artery disease    cath with normal LM, 30% LAD, 85% mid RCA and 95% distal RCA s/p PCI of the mid to distal RCA and now on DAPT with ASA and Ticagrelor.    . Eczema   . Excessive daytime sleepiness 11/26/2015  . Fibromyalgia   . Gallstones   . GERD (gastroesophageal reflux disease)   . Heart murmur    "noted for the 1st time on 04/18/2018"  . History of blood transfusion 07/2010   "S/P  oophorectomy"  . History of gout   . History of hiatal hernia 1980s   "gone now" (04/18/2018)  . Hyperlipidemia   . Hypertension    takes Metoprolol and Enalapril daily  . Hypothyroidism    takes Synthroid daily  . IBS (irritable bowel syndrome)   . Migraine    "nothing in the 2000s" (04/18/2018)  . Mixed connective tissue disease (HCC)   . NAFLD (nonalcoholic fatty liver disease)   . Pneumonia    "several times" (04/18/2018)  . Rheumatoid arthritis (HCC)    "hands, elbows, shoulders, probably knees" (04/18/2018)  . Scoliosis   . Spondylosis   . Type II diabetes mellitus (HCC)    takes Metformin and Hum R daily (04/18/2018)    Patient Active Problem List   Diagnosis Date Noted  . Statin intolerance 08/16/2019  . PAF (paroxysmal atrial fibrillation) (HCC) 01/06/2019  . Gram-negative bacteremia 10/18/2018  . Streptococcal bacteremia 10/18/2018  . Ulcer of lower extremity, limited to breakdown of skin (HCC) 10/03/2018  . CAD S/P percutaneous coronary angioplasty 04/19/2018  . Essential hypertension   . Moderate persistent asthma 03/21/2018  . NAFLD (nonalcoholic fatty liver disease)   . Recurrent cellulitis of lower extremity 01/02/2018  . Cellulitis of lower leg 01/02/2018  . Chronic diarrhea 05/08/2017  . H/O Clostridium difficile infection  05/08/2017  . Rectal bleeding 05/08/2017  . Hyperbilirubinemia 04/29/2016  . Hyponatremia 04/29/2016  . Cellulitis of leg, right 03/09/2016  . Mild persistent asthma 02/15/2016  . Allergic rhinitis due to pollen 02/15/2016  . Anaphylactic reaction due to food 02/10/2016  . Coronary artery disease involving native coronary artery of native heart without angina pectoris 02/10/2016  . Gastroesophageal reflux disease without esophagitis 02/10/2016  . Atopic eczema 02/10/2016  . Cervical nerve root disorder 12/15/2015  . Lumbar radiculopathy 12/15/2015  . Daytime somnolence 11/26/2015  . Venous stasis dermatitis of both lower extremities  02/11/2015  . Morbid obesity (HCC) 06/19/2014  . B-complex deficiency 03/26/2014  . Benign essential HTN 03/26/2014  . Chronic pain associated with significant psychosocial dysfunction 03/26/2014  . Diaphragmatic hernia 03/26/2014  . Gastroesophageal reflux disease 03/26/2014  . Hammer toe 03/26/2014  . H/O neoplasm 03/26/2014  . 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 Diabetic polyneuropathy (HCC) 03/26/2014  . Deafness, sensorineural 03/26/2014  . Fibromyalgia 01/20/2014  . 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 Insulin dependent diabetes mellitus (HCC) 11/11/2012  . Hyperlipidemia LDL goal <70 11/11/2012  . Mixed connective tissue disease (HCC) 11/11/2012  . Hypothyroidism 11/11/2012  . Uncomplicated asthma 11/11/2012  . 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HCC) 02/20/2012  . 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 ANA positive 07/17/2011    Past Surgical History:  Procedure Laterality Date  . ABDOMINAL HYSTERECTOMY  06/2005   "w/right ovariy"  . APPENDECTOMY    . BREAST BIOPSY Bilateral    9 total (04/18/2018)  . CORONARY ANGIOPLASTY WITH STENT PLACEMENT  10/01/2015   normal LM, 30% LAD, 85% mid RCA and 95% distal RCA s/p PCI of the mid to distal RCA and now on DAPT with ASA and Ticagrelor.    . CORONARY STENT INTERVENTION Right 04/18/2018   Procedure: CORONARY STENT INTERVENTION;  Surgeon: [name], MD;  Location: [facility];  Service: Cardiovascular;  Laterality: Right;  . DILATION AND CURETTAGE OF UTERUS    . FRACTURE SURGERY    . LAPAROSCOPIC CHOLECYSTECTOMY    . LEFT HEART CATH AND CORONARY ANGIOGRAPHY N/A 04/18/2018   Procedure: LEFT HEART CATH AND CORONARY ANGIOGRAPHY;  Surgeon: [name], MD;  Location: [facility];  Service: Cardiovascular;  Laterality: N/A;  . MUSCLE BIOPSY Left    "leg"  . OOPHORECTOMY  07/2010  . RADIOLOGY WITH ANESTHESIA N/A 05/25/2016    Procedure: RADIOLOGY WITH ANESTHESIA;  Surgeon: Medication Radiologist, MD;  Location: [facility] OR;  Service: Radiology;  Laterality: N/A;  . TONSILLECTOMY AND ADENOIDECTOMY    . WRIST FRACTURE SURGERY Left    "crushed it"     OB History   No obstetric history on file.     Family History  Problem Relation Age of Onset  . CAD Mother   . Hypertension Mother   . Heart attack Mother   . CAD Father   . Heart attack Father   . Allergic rhinitis Father   . Asthma Father   . Hypertension Brother   . Hypertension Brother   . Pancreatic cancer Paternal Aunt   . Breast cancer Paternal Aunt   . Lung cancer Paternal Aunt     Social History   Tobacco Use  . Smoking status: Former Smoker    Packs/day: 1.00    Years: 20.00    Pack years: 20.00    Types: Cigarettes    Quit date: 02/11/2004  Years since quitting: 15.9  . Smokeless tobacco: Never Used  Substance Use Topics  . Alcohol use: Yes    Comment: 04/18/2018 "couple drinks/year"  . Drug use: Not Currently    Types: Marijuana    Comment: "only in my teens"    Home Medications Prior to Admission medications   Medication Sig Start Date End Date Taking? Authorizing Provider  albuterol (PROVENTIL) (2.5 MG/3ML) 0.083% nebulizer solution Take 3 mLs (2.5 mg total) by nebulization every 6 (six) hours as needed for wheezing or shortness of breath. 12/31/19   Wendling, Crosby Oyster, DO  aspirin EC 81 MG tablet Take 81 mg by mouth daily.    [provider]  augmented betamethasone dipropionate (DIPROLENE-AF) 0.05 % cream APPLY 1 CREAM TOPICALLY ONCE DAILY AS NEEDED FOR  ECZEMA Patient taking differently: Apply 1 application topically daily as needed (eczema).  06/13/18   Shelda Pal, DO  BREO ELLIPTA 200-25 MCG/INH AEPB Inhale 1 puff by mouth once daily 08/28/19   Shelda Pal, DO  clopidogrel (PLAVIX) 75 MG tablet Take 1 tablet by mouth once daily with breakfast 04/02/19   Sueanne Margarita, MD  diltiazem (CARDIZEM  CD) 300 MG 24 hr capsule Take 1 capsule (300 mg total) by mouth daily. 03/28/19 03/27/20  Sueanne Margarita, MD  enalapril (VASOTEC) 10 MG tablet Take 1 tablet (10 mg total) by mouth 2 (two) times daily. 04/23/19   Imogene Burn, PA-C  EPINEPHrine (EPIPEN 2-PAK) 0.3 mg/0.3 mL IJ SOAJ injection USE AS DIRECTED FOR SEVERE ALLERGIC REACTION. 09/08/19   Shelda Pal, DO  fluconazole (DIFLUCAN) 150 MG tablet Take 1 tab, repeat in 48 hours if no improvement. 12/24/19   Shelda Pal, DO  fluconazole (DIFLUCAN) 150 MG tablet Take 1 tab, repeat in 48 hours if no improvement. 12/31/19   Shelda Pal, DO  fluticasone (FLONASE) 50 MCG/ACT nasal spray USE 2 SPRAY(S) IN EACH NOSTRIL ONCE DAILY AS NEEDED FOR  ALLERGIES  OR  RHINITIS 07/18/19   Nani Ravens, Crosby Oyster, DO  furosemide (LASIX) 40 MG tablet Take 1.5 tablets (60 mg total) by mouth 2 (two) times daily. 09/03/19 10/03/19  Shelda Pal, DO  gabapentin (NEURONTIN) 100 MG capsule Take 400-700 mg by mouth See admin instructions. Pt takes 400mg  in the morning, 400mg  at lunchtime and 700mg  in the evening    [provider]  GLUCOMANNAN PO Take 700 mg by mouth 2 (two) times daily.    [provider]  glucose blood (BAYER CONTOUR TEST) test strip 1 each 4 (four) times daily.  03/28/17   [provider]  Guaifenesin (MUCINEX MAXIMUM STRENGTH) 1200 MG TB12 Take 1,200 mg by mouth 2 (two) times daily.    [provider]  hydroxychloroquine (PLAQUENIL) 200 MG tablet Take 200 mg by mouth 2 (two) times daily.    [provider]  insulin regular human CONCENTRATED (HUMULIN R) 500 UNIT/ML injection Inject 0-100 Units into the skin 4 (four) times daily -  before meals and at bedtime. Breakfast and lunch <100-125= 5 units 125-150= 35 u 151-200 = 45 u 201-249 = 55 u 250 - 300 = 65 u 301 - 349 = 75 u 350 - 400 = 85 u 401 - 449 = 95 u 450 - 500 = 105 u  bedtime doses <M100 - 150 = 10  u 151-200= 35 u 201 - 300 = 45u 301- 400 = 55 u 401 - 500 = 65 u    [provider]  ipratropium-albuterol (DUONEB) 0.5-2.5 (3) MG/3ML SOLN USE 3 ML (CC) IN NEBULIZER EVERY 4 HOURS AS NEEDED 09/03/19   Wendling, Jilda Roche, DO  isosorbide mononitrate (IMDUR) 30 MG 24 hr tablet Take 1 tablet (30 mg total) by mouth daily. 03/17/19 03/16/20  Quintella Reichert, MD  levocetirizine (XYZAL) 5 MG tablet TAKE 1 TABLET BY MOUTH ONCE DAILY IN THE EVENING 11/10/19   Wendling, Jilda Roche, DO  levothyroxine (SYNTHROID, LEVOTHROID) 200 MCG tablet Take 200 mcg by mouth daily before breakfast. Except 100 mcg on Monday    [provider]  liraglutide (VICTOZA) 18 MG/3ML SOPN Inject 0.3 mLs (1.8 mg total) into the skin daily. First week inject 0.6 mg daily then 1.2 mg daily for another week. Then move to 1.8 mg daily. 08/16/19   Sharlene Dory, DO  metFORMIN (GLUCOPHAGE-XR) 500 MG 24 hr tablet Take 2 tablets (1,000 mg total) by mouth 2 (two) times daily. 12/17/19   Sharlene Dory, DO  mupirocin ointment (BACTROBAN) 2 % APPLY OINTMENT INTO THE NOSE 2(TWO) TIMES DAILY 08/28/19   Wendling, Jilda Roche, DO  nitroGLYCERIN (NITROSTAT) 0.4 MG SL tablet DISSOLVE ONE TABLET UNDER THE TONGUE EVERY 5 MINUTES AS NEEDED FOR CHEST PAIN.  DO NOT EXCEED A TOTAL OF 3 DOSES IN 15 MINUTES 08/01/19   Robbie Lis M, PA-C  Oxycodone HCl 10 MG TABS SMARTSIG:1-2.5 Tablet(s) By Mouth Every 4-6 Hours PRN 12/17/19   [provider]  pantoprazole (PROTONIX) 40 MG tablet Take 1 tablet by mouth once daily 01/28/19   Robbie Lis M, PA-C  Polyethyl Glycol-Propyl Glycol (SYSTANE ULTRA OP) Apply 1 drop to eye daily as needed (dry eyes).    [provider]  potassium chloride (KLOR-CON) 10 MEQ tablet Take 1 tab for every 20 mg of Lasix taken. 09/03/19   Sharlene Dory, DO  pramipexole (MIRAPEX) 0.5 MG tablet Take 0.5 mg by mouth 2 (two) times daily.    [provider]   PROAIR HFA 108 [phone] Base) MCG/ACT inhaler INHALE 2 PUFFS BY MOUTH EVERY 4 HOURS AS NEEDED FOR WHEEZING AND FOR SHORTNESS OF BREATH 07/29/19   Wendling, Jilda Roche, DO  promethazine (PHENERGAN) 25 MG tablet TAKE 1 TABLET BY MOUTH EVERY 8 HOURS AS NEEDED FOR NAUSEA AND FOR VOMITING 12/25/19   Sharlene Dory, DO  promethazine-dextromethorphan (PROMETHAZINE-DM) 6.25-15 MG/5ML syrup TAKE 5 ML BY MOUTH  4 TIMES DAILY AS NEEDED 01/14/20   Sharlene Dory, DO  pyridoxine (B-6) 100 MG tablet Take 100 mg by mouth 2 (two) times daily.    [provider]  saccharomyces boulardii (FLORASTOR) 250 MG capsule Take 250 mg by mouth 3 (three) times daily.    [provider]  umeclidinium bromide (INCRUSE ELLIPTA) 62.5 MCG/INH AEPB Inhale 1 puff into the lungs daily. 08/15/19   Sharlene Dory, DO  vitamin A 03546 UNIT capsule Take 10,000 Units by mouth daily.    [provider]  vitamin B-12 (CYANOCOBALAMIN) 1000 MCG tablet Take 1,000 mcg by mouth daily.      [provider]  Vitamin D, Ergocalciferol, (DRISDOL) 1.25 MG (50000 UNIT) CAPS capsule Take 1 capsule (50,000 Units total) by mouth every 7 (seven) days. Take on Friday 12/24/19   Sharlene Dory, DO  mupirocin ointment (BACTROBAN) 2 % APPLY OINTMENT INTO THE NOSE 2(TWO) TIMES DAILY 05/07/19   Sharlene Dory, DO    Allergies    Fish allergy, Fish oil, Omega-3 fatty acids, Other, Crestor [rosuvastatin], Gabapentin, Lovastatin,  Metronidazole, Monascus purpureus went yeast, Red yeast rice [cholestin], Rotigotine, Atrovent hfa [ipratropium bromide hfa], Suprep [na sulfate-k sulfate-mg sulf], and Tape  Review of Systems   Review of Systems  All other systems reviewed and are negative.   Physical Exam Updated Vital Signs BP (!) 144/59   Pulse (!) 110   Temp 98.6 F (37 C) (Oral)   Resp (!) 24   Ht 5\' 2"  (1.575 m)   SpO2 96%   BMI 59.26 kg/m   Physical Exam Vitals and nursing  note reviewed. Exam conducted with a chaperone present.  Constitutional:      Appearance: She is obese. She is ill-appearing.  HENT:     Head: Normocephalic and atraumatic.  Eyes:     General: No scleral icterus.    Conjunctiva/sclera: Conjunctivae normal.  Cardiovascular:     Rate and Rhythm: Regular rhythm. Tachycardia present.     Pulses: Normal pulses.     Heart sounds: Normal heart sounds.  Pulmonary:     Comments: Increased work of breathing.  Unable to speak in full sentences.  Frequent nonproductive cough.  No accessory muscle use.  Wheezing auscultated diffusely. Musculoskeletal:     Cervical back: Normal range of motion. No rigidity.  Skin:    General: Skin is dry.  Neurological:     Mental Status: She is alert.     GCS: GCS eye subscore is 4. GCS verbal subscore is 5. GCS motor subscore is 6.  Psychiatric:        Mood and Affect: Mood normal.        Behavior: Behavior normal.        Thought Content: Thought content normal.     ED Results / Procedures / Treatments   Labs (all labs ordered are listed, but only abnormal results are displayed) Labs Reviewed  COMPREHENSIVE METABOLIC PANEL - Abnormal; Notable for the following components:      Result Value   Sodium 134 (*)    Glucose, Bld 101 (*)    Calcium 8.8 (*)    Albumin 3.2 (*)    All other components within normal limits  CBC WITH DIFFERENTIAL/PLATELET - Abnormal; Notable for the following components:   Hemoglobin 11.4 (*)    MCH 24.9 (*)    RDW 17.8 (*)    All other components within normal limits  LACTIC ACID, PLASMA - Abnormal; Notable for the following components:   Lactic Acid, Venous 2.6 (*)    All other components within normal limits  POCT I-STAT EG7 - Abnormal; Notable for the following components:   pCO2, Ven 41.5 (*)    All other components within normal limits  SARS CORONAVIRUS 2 BY RT PCR (HOSPITAL ORDER, PERFORMED IN [facility])  BRAIN NATRIURETIC PEPTIDE  LACTIC ACID, PLASMA   I-STAT VENOUS BLOOD GAS, ED  TROPONIN I (HIGH SENSITIVITY)  TROPONIN I (HIGH SENSITIVITY)    EKG EKG Interpretation  Date/Time:  Wednesday Jan 21 2020 16:31:17 EDT Ventricular Rate:  122 PR Interval:    QRS Duration: 79 QT Interval:  303 QTC Calculation: 432 R Axis:   -56 Text Interpretation: Sinus tachycardia Inferior infarct, old Consider anterior infarct Lateral leads are also involved Baseline wander in lead(s) I II III aVR aVF V3 V4 No STEMI Confirmed by [id number] [phone]) on 01/21/2020 6:11:38 PM   Radiology DG Chest Portable 1 View  Result Date: 01/21/2020 CLINICAL DATA:  59 year old female with history of shortness of breath for the past 5 weeks. EXAM:  PORTABLE CHEST 1 VIEW COMPARISON:  Chest x-ray 08/01/2019. FINDINGS: Film is under penetrated limiting the diagnostic sensitivity and specificity of the examination. With these limitations in mind, there is no definite acute consolidative airspace disease and no pleural effusions. No pneumothorax. Azygos lobe (normal anatomical variant) incidentally noted. Cephalization of the pulmonary vasculature, without frank pulmonary edema. Heart size appears borderline enlarged. Upper mediastinal contours are within normal limits. IMPRESSION: 1. Heart size appears borderline enlarged and there is some cephalization of the pulmonary vasculature, but no frank pulmonary edema. Electronically Signed   By: Trudie Reed M.D.   On: 01/21/2020 17:44   DG Foot Complete Right  Result Date: 01/21/2020 CLINICAL DATA:  Right foot ulcer. EXAM: RIGHT FOOT COMPLETE - 3+ VIEW COMPARISON:  None. FINDINGS: Acute fracture deformity is seen extending through the distal aspect of the fifth left metatarsal. There is no evidence of dislocation. There is no evidence of arthropathy or other focal bone abnormality. A 2.4 cm area of soft tissue ulceration is seen along the lateral aspect of the distal right foot. This is adjacent to the previous described  fracture deformity. Moderate severity diffuse soft tissue swelling is seen. IMPRESSION: 1. Acute fracture of the fifth left metatarsal with an adjacent area of soft tissue ulceration. 2. Acute osteomyelitis cannot be excluded. MRI correlation is recommended. Electronically Signed   By: Aram Candela M.D.   On: 01/21/2020 19:44    Procedures Procedures (including critical care time)  Medications Ordered in ED Medications  methylPREDNISolone sodium succinate (SOLU-MEDROL) 125 mg/2 mL injection 125 mg (125 mg Intravenous Given 01/21/20 1810)  magnesium sulfate (IV Push/IM) injection 1 g (1 g Intravenous Given 01/21/20 1812)  albuterol (VENTOLIN HFA) 108 (90 Base) MCG/ACT inhaler 4 puff (4 puffs Inhalation Given 01/21/20 1809)    ED Course  I have reviewed the triage vital signs and the nursing notes.  Pertinent labs & imaging results that were available during my care of the patient were reviewed by me and considered in my medical decision making (see chart for details).  Clinical Course as of Jan 20 2041  Wed Jan 21, 2020  [id number] 59 year old female with a history of obesity, COPD, diabetes, presented to the emergency department with persistent cough and shortness of breath.  The patient ports symptoms been ongoing for about a month.  She initially had some fevers and chills.  She has been treated extensively as an outpatient with 3 courses of steroids, as well as a course of doxycycline and ciprofloxacin.  Her course is wax and wane.  She presents ED today with persistent coughing.  She says is driving her crazy.  She has difficulty sleeping and has orthopnea at night.  She has poor mobility at baseline due to her body habitus and obesity, as well as neuropathy in her feet.  She separately reports that she had an ulceration and pain in her right foot along lateral aspect of her fifth toe beginning 1 month ago.  Here in the ED the patient initially was breathing heavily with persistent dry coughing.   Heart rate was initially tachycardic.  She was afebrile.  Her blood pressures been stable.  She is also been stable on room air.  Was very difficult to auscultate her lungs with her body habitus, but I did not appreciate any significant wheezing.  The PA provider did feel there was wheezing on her arrival, and had ordered her albuterol as well as IV magnesium and Solu-Medrol.  Patient reported to me that  she felt much better after his medications in particular her coughing had stopped.  Work-up otherwise was unremarkable.  Her BNP was nearly 0.  Her lactate was 1.4.  Her delta troponins were flat.  Her Covid test was negative.  I suspect this may be a case of bronchitis.  Particularly if she felt better after the IV magnesium she may been having bronchospasm.   [MT]  2001 X-rays of her foot where she has a deep ulceration of the lateral foot show a displaced fracture of the right fifth toe.  The patient was surprised to hear this.  She has been walking on this foot for a month.  She did have an appointment with the wound care clinic today but did not make the appointment.  She does not actively have a podiatrist.  Will I do not think she actively has an infection with no leukocytosis and a normal lactate, I do think she needs to see a podiatrist for this broken toe and also for her diabetic foot ulcer.  We will try to provide her office information.   [MT]  2002 The patient very strongly wants to go home.  I did offer her an observation admission given the way she was breathing when she came in, but she feels better and wants to go home.  I would not initiate another round of steroids with her diabetes history.  Advised albuterol at home.  She is on lasix PRN and I suggested taking this for 5 days given the mild congestion on her xray.  She gets bad cramps from lasix, but has supplemental Magnesium and K to take with it.     [MT]    Clinical Course User Index [MT] Trifan, Kermit Balo, MD   MDM  Rules/Calculators/A&P                      Patient presents to the ED with various complaints.  She has a large ulcerative lesion to the lateral plantar aspect of her right foot.  I obtained plain films to assess for soft tissue gas and concern for osteomyelitis.  I personally reviewed the findings which demonstrate an acute fifth metatarsal fracture, consistent with her history of diabetic neuropathy.  She is obviously at high risk for osteomyelitis.  Encourage her to follow-up with Dr. August Saucer with [facility] orthopedics for ongoing evaluation management.  Encourage her to call them tomorrow.  Her laboratory work-up was largely unremarkable.  Chest x-ray demonstrates no acute cardiopulmonary findings and her BNP WNL.  EKG, CMP, CBC, and i-STAT VBG also unremarkable.  Patient negative PCR COVID-19 testing.  Troponin normal and no need to trend.  While we initially suspected that patient would be admitted for her increased work of breathing, she improved significantly with IV Solu-Medrol, IV magnesium, and albuterol inhalers.  She is adamant that she does not want to be admitted and does appear to be improved after receiving treatment here in the ED.  Emphasized the importance of strict ED return precautions.  She will follow up with her primary care provider regarding today's encounter.  She will also follow-up with her wound care clinic as well as with orthopedics for ongoing evaluation and management.   Final Clinical Impression(s) / ED Diagnoses Final diagnoses:  Shortness of breath  Nondisplaced fracture of fifth metatarsal bone, unspecified foot, initial encounter for closed fracture    Rx / DC Orders ED Discharge Orders    None       Drako Maese,  Sharion Settler, PA-C 01/21/20 2042    Terald Sleeper, MD 01/22/20 1036

## 2020-01-21 NOTE — ED Notes (Signed)
ED Provider at bedside. 

## 2020-01-21 NOTE — Progress Notes (Signed)
[facility] [address], [address] [place], [place] [address] [phone]  Date:  01/21/2020   Name:  [name]   DOB:  1961/05/05   MRN:  [id number]  PCP:  Sharlene Dory, DO    Chief Complaint: No chief complaint on file.   History of Present Illness:  [name] is a 59 y.o. very pleasant female patient who presents with the following:  Virtual visit today for this patient with history of obesity, atrial fibrillation, CAD status post PCI, hypertension, asthma Primary patient of Dr. Carmelia Roller, but I have seen her previously-most recently in 2018 She had a virtual visit with Dr. Carmelia Roller on April 21 for URI complaints.  A week previously she had been prescribed prednisone and doxycycline.  On 4/21 she was given a higher prednisone taper, azithromycin, albuterol nebs.  Patient also reports she took a course of Cipro between April 21st and today  Patient location is home, provider location is office.  Patient identity confirmed with 2 factors, she gives consent for virtual visit today.  The patient, myself, and her husband [name] on the call today  They have noted more severe cough and wheezing over the last several weeks, seems to be getting worse She does have asthma but not typically this severe  She uses Incruse ellipta inhaler and also Breo, duonebs and plain albuterol  She does not see pulmonology  She is checking her home O2- it is 91% now-states that typically her oxygen will be about 98% She has noted intermittent fevers- this started a week ago,up to 100.2 today She has not had covid 19 shots yet-reports they were advised not to get this vaccine due to her autoimmune disorder   Wt Readings from Last 3 Encounters:  08/20/19 (!) 324 lb (147 kg)  08/15/19 (!) 324 lb 3.2 oz (147.1 kg)  08/01/19 (!) 321 lb (145.6 kg)    Patient Active Problem List   Diagnosis Date Noted  . Morbid obesity (HCC)  06/19/2014    Priority: Medium  . Statin intolerance 08/16/2019  . PAF (paroxysmal atrial fibrillation) (HCC) 01/06/2019  . Gram-negative bacteremia 10/18/2018  . Streptococcal bacteremia 10/18/2018  . Ulcer of lower extremity, limited to breakdown of skin (HCC) 10/03/2018  . CAD S/P percutaneous coronary angioplasty 04/19/2018  . Essential hypertension   . Moderate persistent asthma 03/21/2018  . NAFLD (nonalcoholic fatty liver disease)   . Recurrent cellulitis of lower extremity 01/02/2018  . Cellulitis of lower leg 01/02/2018  . Chronic diarrhea 05/08/2017  . H/O Clostridium difficile infection 05/08/2017  . Rectal bleeding 05/08/2017  . Hyperbilirubinemia 04/29/2016  . Hyponatremia 04/29/2016  . Cellulitis of leg, right 03/09/2016  . Mild persistent asthma 02/15/2016  . Allergic rhinitis due to pollen 02/15/2016  . Anaphylactic reaction due to food 02/10/2016  . Coronary artery disease involving native coronary artery of native heart without angina pectoris 02/10/2016  . Gastroesophageal reflux disease without esophagitis 02/10/2016  . Atopic eczema 02/10/2016  . Cervical nerve root disorder 12/15/2015  . Lumbar radiculopathy 12/15/2015  . Daytime somnolence 11/26/2015  . Venous stasis dermatitis of both lower extremities 02/11/2015  . B-complex deficiency 03/26/2014  . Benign essential HTN 03/26/2014  . Chronic pain associated with significant psychosocial dysfunction 03/26/2014  . Diaphragmatic hernia 03/26/2014  . Gastroesophageal reflux disease 03/26/2014  . Hammer toe 03/26/2014  . H/O neoplasm 03/26/2014  . 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 Diabetic polyneuropathy (HCC) 03/26/2014  .  Deafness, sensorineural 03/26/2014  . Fibromyalgia 01/20/2014  . 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 Insulin dependent diabetes mellitus (HCC) 11/11/2012  . Hyperlipidemia LDL goal <70 11/11/2012  . Mixed connective tissue disease (HCC) 11/11/2012  . Hypothyroidism  11/11/2012  . Uncomplicated asthma 11/11/2012  . 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HCC) 02/20/2012  . 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 ANA positive 07/17/2011    Past Medical History:  Diagnosis Date  . Asthma    "on daily RX and rescue inhaler" (04/18/2018)  . Chronic diastolic CHF (congestive heart failure) (HCC) 09/2015  . Chronic lower back pain   . Chronic neck pain   . Chronic pain syndrome    Fentanyl Patch  . Colon polyps   . Coronary artery disease    cath with normal LM, 30% LAD, 85% mid RCA and 95% distal RCA s/p PCI of the mid to distal RCA and now on DAPT with ASA and Ticagrelor.    . Eczema   . Excessive daytime sleepiness 11/26/2015  . Fibromyalgia   . Gallstones   . GERD (gastroesophageal reflux disease)   . Heart murmur    "noted for the 1st time on 04/18/2018"  . History of blood transfusion 07/2010   "S/P oophorectomy"  . History of gout   . History of hiatal hernia 1980s   "gone now" (04/18/2018)  . Hyperlipidemia   . Hypertension    takes Metoprolol and Enalapril daily  . Hypothyroidism    takes Synthroid daily  . IBS (irritable bowel syndrome)   . Migraine    "nothing in the 2000s" (04/18/2018)  . Mixed connective tissue disease (HCC)   . NAFLD (nonalcoholic fatty liver disease)   . Pneumonia    "several times" (04/18/2018)  . Rheumatoid arthritis (HCC)    "hands, elbows, shoulders, probably knees" (04/18/2018)  . Scoliosis   . Spondylosis   . Type II diabetes mellitus (HCC)    takes Metformin and Hum R daily (04/18/2018)    Past Surgical History:  Procedure Laterality Date  . ABDOMINAL HYSTERECTOMY  06/2005   "w/right ovariy"  . APPENDECTOMY    . BREAST BIOPSY Bilateral    9 total (04/18/2018)  . CORONARY ANGIOPLASTY WITH STENT PLACEMENT  10/01/2015   normal LM, 30% LAD, 85% mid RCA and 95% distal RCA s/p PCI of the mid to distal RCA and now on DAPT with ASA and Ticagrelor.    .  CORONARY STENT INTERVENTION Right 04/18/2018   Procedure: CORONARY STENT INTERVENTION;  Surgeon: [name], MD;  Location: [facility] INVASIVE CV [facility];  Service: Cardiovascular;  Laterality: Right;  . DILATION AND CURETTAGE OF UTERUS    . FRACTURE SURGERY    . LAPAROSCOPIC CHOLECYSTECTOMY    . LEFT HEART CATH AND CORONARY ANGIOGRAPHY N/A 04/18/2018   Procedure: LEFT HEART CATH AND CORONARY ANGIOGRAPHY;  Surgeon: [name], MD;  Location: [facility] INVASIVE CV [facility];  Service: Cardiovascular;  Laterality: N/A;  . MUSCLE BIOPSY Left    "leg"  . OOPHORECTOMY  07/2010  . RADIOLOGY WITH ANESTHESIA N/A 05/25/2016   Procedure: RADIOLOGY WITH ANESTHESIA;  Surgeon: Medication Radiologist, MD;  Location: [facility] OR;  Service: Radiology;  Laterality: N/A;  . TONSILLECTOMY AND ADENOIDECTOMY    . WRIST FRACTURE SURGERY Left    "crushed it"    Social History   Tobacco Use  . Smoking status: Former Smoker    Packs/day: 1.00    Years: 20.00  Pack years: 20.00    Types: Cigarettes    Quit date: 02/11/2004    Years since quitting: 15.9  . Smokeless tobacco: Never Used  Substance Use Topics  . Alcohol use: Yes    Comment: 04/18/2018 "couple drinks/year"  . Drug use: Not Currently    Types: Marijuana    Comment: "only in my teens"    Family History  Problem Relation Age of Onset  . CAD Mother   . Hypertension Mother   . Heart attack Mother   . CAD Father   . Heart attack Father   . Allergic rhinitis Father   . Asthma Father   . Hypertension Brother   . Hypertension Brother   . Pancreatic cancer Paternal Aunt   . Breast cancer Paternal Aunt   . Lung cancer Paternal Aunt     Allergies  Allergen Reactions  . Fish Allergy Anaphylaxis    INCLUDES OMEGA 3 OILS  . Fish Oil Anaphylaxis and Swelling    THROAT SWELLS INCLUDES FISH AS A CLASS  . Omega-3 Fatty Acids Swelling    Throat swelling  Throat swelling   . Other Anaphylaxis, Other (See Comments), Nausea And Vomiting and Shortness  Of Breath    UNSPECIFIED REACTION  Perch Other reaction(s): Other (See Comments) INCLUDES OMEGA 3 OILS Perch UNSPECIFIED REACTION  Perch  . Crestor [Rosuvastatin] Other (See Comments)    Extreme joint pain, to this & other statins  . Gabapentin Anxiety    Anxiety on high doses  . Lovastatin Other (See Comments)    EXTREME JOINT PAIN  . Metronidazole Nausea Only and Swelling    Nausea and vomiting  Other reaction(s): Other (See Comments) Headache and shakes Nausea and vomiting  . Monascus Purpureus Went Yeast Other (See Comments)    Muscle pain and severe joint pain.   . Red Yeast Rice [Cholestin] Other (See Comments)    Muscle pain and severe joint pain.   . Rotigotine Swelling    Extreme edema   . Atrovent Hfa [Ipratropium Bromide Hfa] Other (See Comments)    wheezing  . Suprep [Na Sulfate-K Sulfate-Mg Sulf] Nausea And Vomiting  . Tape Other (See Comments)    Electrodes causes skin breakdown    Medication list has been reviewed and updated.  Current Outpatient Medications on File Prior to Visit  Medication Sig Dispense Refill  . albuterol (PROVENTIL) (2.5 MG/3ML) 0.083% nebulizer solution Take 3 mLs (2.5 mg total) by nebulization every 6 (six) hours as needed for wheezing or shortness of breath. 150 mL 1  . aspirin EC 81 MG tablet Take 81 mg by mouth daily.    Marland Kitchen augmented betamethasone dipropionate (DIPROLENE-AF) 0.05 % cream APPLY 1 CREAM TOPICALLY ONCE DAILY AS NEEDED FOR  ECZEMA (Patient taking differently: Apply 1 application topically daily as needed (eczema). ) 15 g 3  . BREO ELLIPTA 200-25 MCG/INH AEPB Inhale 1 puff by mouth once daily 60 each 0  . clopidogrel (PLAVIX) 75 MG tablet Take 1 tablet by mouth once daily with breakfast 90 tablet 3  . diltiazem (CARDIZEM CD) 300 MG 24 hr capsule Take 1 capsule (300 mg total) by mouth daily. 90 capsule 3  . enalapril (VASOTEC) 10 MG tablet Take 1 tablet (10 mg total) by mouth 2 (two) times daily. 180 tablet 3  .  EPINEPHrine (EPIPEN 2-PAK) 0.3 mg/0.3 mL IJ SOAJ injection USE AS DIRECTED FOR SEVERE ALLERGIC REACTION. 2 each 0  . fluconazole (DIFLUCAN) 150 MG tablet Take 1 tab, repeat in  48 hours if no improvement. 2 tablet 0  . fluconazole (DIFLUCAN) 150 MG tablet Take 1 tab, repeat in 48 hours if no improvement. 2 tablet 0  . fluticasone (FLONASE) 50 MCG/ACT nasal spray USE 2 SPRAY(S) IN EACH NOSTRIL ONCE DAILY AS NEEDED FOR  ALLERGIES  OR  RHINITIS 16 g 0  . furosemide (LASIX) 40 MG tablet Take 1.5 tablets (60 mg total) by mouth 2 (two) times daily. 90 tablet 0  . gabapentin (NEURONTIN) 100 MG capsule Take 400-700 mg by mouth See admin instructions. Pt takes 400mg  in the morning, 400mg  at lunchtime and 700mg  in the evening    . GLUCOMANNAN PO Take 700 mg by mouth 2 (two) times daily.    Marland Kitchen glucose blood (BAYER CONTOUR TEST) test strip 1 each 4 (four) times daily.     . Guaifenesin (MUCINEX MAXIMUM STRENGTH) 1200 MG TB12 Take 1,200 mg by mouth 2 (two) times daily.    . hydroxychloroquine (PLAQUENIL) 200 MG tablet Take 200 mg by mouth 2 (two) times daily.    . insulin regular human CONCENTRATED (HUMULIN R) 500 UNIT/ML injection Inject 0-100 Units into the skin 4 (four) times daily -  before meals and at bedtime. Breakfast and lunch <100-125= 5 units 125-150= 35 u 151-200 = 45 u 201-249 = 55 u 250 - 300 = 65 u 301 - 349 = 75 u 350 - 400 = 85 u 401 - 449 = 95 u 450 - 500 = 105 u  bedtime doses <M100 - 150 = 10 u 151-200= 35 u 201 - 300 = 45u 301- 400 = 55 u 401 - 500 = 65 u    . ipratropium-albuterol (DUONEB) 0.5-2.5 (3) MG/3ML SOLN USE 3 ML (CC) IN NEBULIZER EVERY 4 HOURS AS NEEDED 360 mL 0  . isosorbide mononitrate (IMDUR) 30 MG 24 hr tablet Take 1 tablet (30 mg total) by mouth daily. 90 tablet 3  . levocetirizine (XYZAL) 5 MG tablet TAKE 1 TABLET BY MOUTH ONCE DAILY IN THE EVENING 90 tablet 1  . levothyroxine (SYNTHROID, LEVOTHROID) 200 MCG tablet Take 200 mcg by mouth daily before breakfast.  Except 100 mcg on Monday    . liraglutide (VICTOZA) 18 MG/3ML SOPN Inject 0.3 mLs (1.8 mg total) into the skin daily. First week inject 0.6 mg daily then 1.2 mg daily for another week. Then move to 1.8 mg daily. 5 pen 11  . metFORMIN (GLUCOPHAGE-XR) 500 MG 24 hr tablet Take 2 tablets (1,000 mg total) by mouth 2 (two) times daily. 360 tablet 1  . mupirocin ointment (BACTROBAN) 2 % APPLY OINTMENT INTO THE NOSE 2(TWO) TIMES DAILY 22 g 0  . nitroGLYCERIN (NITROSTAT) 0.4 MG SL tablet DISSOLVE ONE TABLET UNDER THE TONGUE EVERY 5 MINUTES AS NEEDED FOR CHEST PAIN.  DO NOT EXCEED A TOTAL OF 3 DOSES IN 15 MINUTES 25 tablet 0  . pantoprazole (PROTONIX) 40 MG tablet Take 1 tablet by mouth once daily 90 tablet 3  . Polyethyl Glycol-Propyl Glycol (SYSTANE ULTRA OP) Apply 1 drop to eye daily as needed (dry eyes).    . potassium chloride (KLOR-CON) 10 MEQ tablet Take 1 tab for every 20 mg of Lasix taken. 180 tablet 2  . pramipexole (MIRAPEX) 0.5 MG tablet Take 0.5 mg by mouth 2 (two) times daily.    Marland Kitchen PROAIR HFA 108 (90 Base) MCG/ACT inhaler INHALE 2 PUFFS BY MOUTH EVERY 4 HOURS AS NEEDED FOR WHEEZING AND FOR SHORTNESS OF BREATH 18 g 0  . promethazine (PHENERGAN)  25 MG tablet TAKE 1 TABLET BY MOUTH EVERY 8 HOURS AS NEEDED FOR NAUSEA AND FOR VOMITING 30 tablet 0  . promethazine-dextromethorphan (PROMETHAZINE-DM) 6.25-15 MG/5ML syrup TAKE 5 ML BY MOUTH  4 TIMES DAILY AS NEEDED 118 mL 0  . pyridoxine (B-6) 100 MG tablet Take 100 mg by mouth 2 (two) times daily.    Marland Kitchen saccharomyces boulardii (FLORASTOR) 250 MG capsule Take 250 mg by mouth 3 (three) times daily.    Marland Kitchen umeclidinium bromide (INCRUSE ELLIPTA) 62.5 MCG/INH AEPB Inhale 1 puff into the lungs daily. 90 each 2  . vitamin A 27253 UNIT capsule Take 10,000 Units by mouth daily.    . vitamin B-12 (CYANOCOBALAMIN) 1000 MCG tablet Take 1,000 mcg by mouth daily.      . Vitamin D, Ergocalciferol, (DRISDOL) 1.25 MG (50000 UNIT) CAPS capsule Take 1 capsule (50,000 Units  total) by mouth every 7 (seven) days. Take on Friday 12 capsule 3  . [DISCONTINUED] mupirocin ointment (BACTROBAN) 2 % APPLY OINTMENT INTO THE NOSE 2(TWO) TIMES DAILY 22 g 0   No current facility-administered medications on file prior to visit.    Review of Systems:  As per HPI- otherwise negative.   Physical Examination: There were no vitals filed for this visit. There were no vitals filed for this visit. There is no height or weight on file to calculate BMI. Ideal Body Weight:    Patient is observed over video monitor today.  She is coughing almost constantly, having a hard time speaking.  Her husband has to do a lot of the talking for her due to her cough.  In between bouts of coughing she is able to speak in full sentences, does not seem to be in acute distress  Assessment and Plan: Severe asthma with exacerbation, unspecified whether persistent  Video visit today for asthma exacerbation.  Patient has been treated with 3 different antibiotics and at least 2 rounds of steroids over the last month.  Today she still coughing and having difficulty talking due to cough.  They note slightly decreased oxygen level at home.  She is also running a fever, concern for pneumonia or possible COVID-19.  She is at high risk for complications given obesity, autoimmune disorder, insulin-dependent diabetes, asthma  I advised them to proceed to the med center emergency room immediately.  They agreed to do so  Signed Abbe Amsterdam, MD

## 2020-01-21 NOTE — Discharge Instructions (Addendum)
Please notify your primary care provider of today's encounter.  I would like you to continue taking your Lasix and to use the albuterol inhaler, as directed.  Please call the office of Dr. Rise Paganini with [facility] to schedule appointment for ongoing evaluation and management of your fifth metatarsal fracture with concern for osteomyelitis.  You will need to call them tomorrow and follow up ASAP.  Please also reschedule your wound care appointment.  Please return to the ED or seek immediate medical attention should you experience any new or worsening symptoms.

## 2020-01-21 NOTE — ED Triage Notes (Signed)
Shortness of breath x5 weeks, worse last night. Pt reports productive cough and fever at home, hx asthma

## 2020-01-21 NOTE — ED Notes (Signed)
Pt with constant dry cough, Obese, very SOB with transferring from W/C to bed. 02 remained stable at 98%

## 2020-01-23 ENCOUNTER — Telehealth: Payer: Self-pay

## 2020-01-23 ENCOUNTER — Other Ambulatory Visit: Payer: Self-pay | Admitting: Surgical

## 2020-01-23 ENCOUNTER — Ambulatory Visit (INDEPENDENT_AMBULATORY_CARE_PROVIDER_SITE_OTHER): Payer: 59 | Admitting: Orthopedic Surgery

## 2020-01-23 ENCOUNTER — Other Ambulatory Visit: Payer: Self-pay

## 2020-01-23 DIAGNOSIS — M79671 Pain in right foot: Secondary | ICD-10-CM | POA: Diagnosis not present

## 2020-01-23 MED ORDER — ALPRAZOLAM 1 MG PO TABS: 1.0000 mg | ORAL_TABLET | Freq: Once | ORAL | 0 refills | Status: AC

## 2020-01-23 MED ORDER — CLINDAMYCIN HCL 300 MG PO CAPS
ORAL_CAPSULE | ORAL | 0 refills | Status: DC
Start: 2020-01-23 — End: 2020-02-04

## 2020-01-23 NOTE — Telephone Encounter (Signed)
Dr August Saucer to dictate note this weekend from today so we can submit for auth to patients insurance. Patient will keep appt with Dr Lajoyce Corners on Monday.  She stated that [facility] called her and they do not accept her insurance and scan will need to be done at [facility].

## 2020-01-23 NOTE — Telephone Encounter (Signed)
Patient will need xanax 1mg  po before MRI #2 with no refill Can you please submit?

## 2020-01-23 NOTE — Telephone Encounter (Signed)
submitted

## 2020-01-24 ENCOUNTER — Encounter: Payer: Self-pay | Admitting: Orthopedic Surgery

## 2020-01-24 NOTE — Progress Notes (Signed)
Office Visit Note   Patient: [name]           Date of Birth: 1960-10-13           MRN: [id number] Visit Date: 01/23/2020 Requested by: Shelda Pal, DO [address] [place],  [place] [address] PCP: Shelda Pal, DO  Subjective: Chief Complaint  Patient presents with  . Right Foot - Pain    HPI: [name] is a 59 year old patient with right foot issues.  She is here as a follow-up from the emergency department.  Denies any history of injury.  She is diabetic and has multiple medical issues.  She has an autoimmune problem.  Blood glucose has not been that well controlled lately.  Outside radiographs are reviewed and it does show what appears to be pathologic fracture through the metatarsal neck of the fifth metatarsal with no gas in the tissue and no other areas of bony involvement in the other metatarsals.  Denies any fevers or chills.  She has been on antibiotics for about the past 3 weeks.  She has a history of an ulcer on the left foot which required 8 months to heal.  The Viva sock was only moderately effective in helping that the heel.  She does have some type of ACE wraps on both legs.              ROS: All systems reviewed are negative as they relate to the chief complaint within the history of present illness.  Patient denies  fevers or chills.   Assessment & Plan: Visit Diagnoses:  1. Pain in right foot     Plan: Impression is right foot osteomyelitis with 3 x 4 cm ulcer over the metatarsal head.  No obvious fluctuance or abscess on examination physically.  Pedal pulses are palpable.  This is a tough problem for [facility].  Really looking at limb salvage at this point.  She will need some type of ray versus forefoot amputation.  May be able to partially spare the medial aspect of the foot.  Nonetheless for now I think if possible MRI of the foot to evaluate for osteomyelitis and the remaining portion of the foot is indicated.  Clindamycin for 5  days also indicated until she sees Dr. Sharol Given on Monday.  Prescription for Xanax written for anxiety about the MRI scan although only the bottom half of her body realistically should be in the scanner for that.  Follow-Up Instructions: No follow-ups on file.   Orders:  Orders Placed This Encounter  Procedures  . MR Foot Right w/o contrast   Meds ordered this encounter  Medications  . clindamycin (CLEOCIN) 300 MG capsule    Sig: 600mg  po bid x 5 days    Dispense:  20 capsule    Refill:  0      Procedures: No procedures performed   Clinical Data: No additional findings.  Objective: Vital Signs: There were no vitals taken for this visit.  Physical Exam:   Constitutional: Patient appears well-developed HEENT:  Head: Normocephalic Eyes:EOM are normal Neck: Normal range of motion Cardiovascular: Normal rate Pulmonary/chest: Effort normal Neurologic: Patient is alert Skin: Skin is warm Psychiatric: Patient has normal mood and affect    Ortho Exam: Ortho exam demonstrates increased body mass index.  Right foot has palpable pedal pulses but somewhat diminished sensation consistent with neuropathy on the dorsal and plantar aspect of the foot.  Cavernous type ulcer around the lateral plantar  region of the fifth metatarsal head.  The fifth toe is perfused.  Slight swelling in the forefoot region right versus left but there is no fluctuance or tissue crepitus or expressible drainage from this ulceration.  Ankle dorsiflexion plantarflexion intact.  Specialty Comments:  No specialty comments available.  Imaging: No results found.   PMFS History: Patient Active Problem List   Diagnosis Date Noted  . Statin intolerance 08/16/2019  . PAF (paroxysmal atrial fibrillation) (HCC) 01/06/2019  . Gram-negative bacteremia 10/18/2018  . Streptococcal bacteremia 10/18/2018  . Ulcer of lower extremity, limited to breakdown of skin (HCC) 10/03/2018  . CAD S/P percutaneous coronary  angioplasty 04/19/2018  . Essential hypertension   . Moderate persistent asthma 03/21/2018  . NAFLD (nonalcoholic fatty liver disease)   . Recurrent cellulitis of lower extremity 01/02/2018  . Cellulitis of lower leg 01/02/2018  . Chronic diarrhea 05/08/2017  . H/O Clostridium difficile infection 05/08/2017  . Rectal bleeding 05/08/2017  . Hyperbilirubinemia 04/29/2016  . Hyponatremia 04/29/2016  . Cellulitis of leg, right 03/09/2016  . Mild persistent asthma 02/15/2016  . Allergic rhinitis due to pollen 02/15/2016  . Anaphylactic reaction due to food 02/10/2016  . Coronary artery disease involving native coronary artery of native heart without angina pectoris 02/10/2016  . Gastroesophageal reflux disease without esophagitis 02/10/2016  . Atopic eczema 02/10/2016  . Cervical nerve root disorder 12/15/2015  . Lumbar radiculopathy 12/15/2015  . Daytime somnolence 11/26/2015  . Venous stasis dermatitis of both lower extremities 02/11/2015  . Morbid obesity (HCC) 06/19/2014  . B-complex deficiency 03/26/2014  . Benign essential HTN 03/26/2014  . Chronic pain associated with significant psychosocial dysfunction 03/26/2014  . Diaphragmatic hernia 03/26/2014  . Gastroesophageal reflux disease 03/26/2014  . Hammer toe 03/26/2014  . H/O neoplasm 03/26/2014  . 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 Diabetic polyneuropathy (HCC) 03/26/2014  . Deafness, sensorineural 03/26/2014  . Fibromyalgia 01/20/2014  . 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 Insulin dependent diabetes mellitus (HCC) 11/11/2012  . Hyperlipidemia LDL goal <70 11/11/2012  . Mixed connective tissue disease (HCC) 11/11/2012  . Hypothyroidism 11/11/2012  . Uncomplicated asthma 11/11/2012  . 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HCC) 02/20/2012  . 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 ANA positive 07/17/2011   Past Medical History:  Diagnosis Date  .  Asthma    "on daily RX and rescue inhaler" (04/18/2018)  . Chronic diastolic CHF (congestive heart failure) (HCC) 09/2015  . Chronic lower back pain   . Chronic neck pain   . Chronic pain syndrome    Fentanyl Patch  . Colon polyps   . Coronary artery disease    cath with normal LM, 30% LAD, 85% mid RCA and 95% distal RCA s/p PCI of the mid to distal RCA and now on DAPT with ASA and Ticagrelor.    . Eczema   . Excessive daytime sleepiness 11/26/2015  . Fibromyalgia   . Gallstones   . GERD (gastroesophageal reflux disease)   . Heart murmur    "noted for the 1st time on 04/18/2018"  . History of blood transfusion 07/2010   "S/P oophorectomy"  . History of gout   . History of hiatal hernia 1980s   "gone now" (04/18/2018)  . Hyperlipidemia   . Hypertension    takes Metoprolol and Enalapril daily  . Hypothyroidism    takes Synthroid daily  . IBS (irritable bowel syndrome)   . Migraine    "nothing in the 2000s" (  04/18/2018)  . Mixed connective tissue disease (HCC)   . NAFLD (nonalcoholic fatty liver disease)   . Pneumonia    "several times" (04/18/2018)  . Rheumatoid arthritis (HCC)    "hands, elbows, shoulders, probably knees" (04/18/2018)  . Scoliosis   . Spondylosis   . Type II diabetes mellitus (HCC)    takes Metformin and Hum R daily (04/18/2018)    Family History  Problem Relation Age of Onset  . CAD Mother   . Hypertension Mother   . Heart attack Mother   . CAD Father   . Heart attack Father   . Allergic rhinitis Father   . Asthma Father   . Hypertension Brother   . Hypertension Brother   . Pancreatic cancer Paternal Aunt   . Breast cancer Paternal Aunt   . Lung cancer Paternal Aunt     Past Surgical History:  Procedure Laterality Date  . ABDOMINAL HYSTERECTOMY  06/2005   "w/right ovariy"  . APPENDECTOMY    . BREAST BIOPSY Bilateral    9 total (04/18/2018)  . CORONARY ANGIOPLASTY WITH STENT PLACEMENT  10/01/2015   normal LM, 30% LAD, 85% mid RCA and 95% distal RCA s/p  PCI of the mid to distal RCA and now on DAPT with ASA and Ticagrelor.    . CORONARY STENT INTERVENTION Right 04/18/2018   Procedure: CORONARY STENT INTERVENTION;  Surgeon: [name], MD;  Location: [facility];  Service: Cardiovascular;  Laterality: Right;  . DILATION AND CURETTAGE OF UTERUS    . FRACTURE SURGERY    . LAPAROSCOPIC CHOLECYSTECTOMY    . LEFT HEART CATH AND CORONARY ANGIOGRAPHY N/A 04/18/2018   Procedure: LEFT HEART CATH AND CORONARY ANGIOGRAPHY;  Surgeon: [name], MD;  Location: [facility];  Service: Cardiovascular;  Laterality: N/A;  . MUSCLE BIOPSY Left    "leg"  . OOPHORECTOMY  07/2010  . RADIOLOGY WITH ANESTHESIA N/A 05/25/2016   Procedure: RADIOLOGY WITH ANESTHESIA;  Surgeon: Medication Radiologist, MD;  Location: [facility] OR;  Service: Radiology;  Laterality: N/A;  . TONSILLECTOMY AND ADENOIDECTOMY    . WRIST FRACTURE SURGERY Left    "crushed it"   Social History   Occupational History  . Not on file  Tobacco Use  . Smoking status: Former Smoker    Packs/day: 1.00    Years: 20.00    Pack years: 20.00    Types: Cigarettes    Quit date: 02/11/2004    Years since quitting: 15.9  . Smokeless tobacco: Never Used  Substance and Sexual Activity  . Alcohol use: Yes    Comment: 04/18/2018 "couple drinks/year"  . Drug use: Not Currently    Types: Marijuana    Comment: "only in my teens"  . Sexual activity: Yes    Birth control/protection: Surgical

## 2020-01-26 ENCOUNTER — Other Ambulatory Visit: Payer: Self-pay

## 2020-01-26 ENCOUNTER — Ambulatory Visit (INDEPENDENT_AMBULATORY_CARE_PROVIDER_SITE_OTHER): Payer: 59 | Admitting: Orthopedic Surgery

## 2020-01-26 ENCOUNTER — Encounter: Payer: Self-pay | Admitting: Orthopedic Surgery

## 2020-01-26 ENCOUNTER — Other Ambulatory Visit: Payer: Self-pay | Admitting: Physician Assistant

## 2020-01-26 VITALS — Ht 62.0 in | Wt 324.0 lb

## 2020-01-26 DIAGNOSIS — M86271 Subacute osteomyelitis, right ankle and foot: Secondary | ICD-10-CM

## 2020-01-26 DIAGNOSIS — Z6841 Body Mass Index (BMI) 40.0 and over, adult: Secondary | ICD-10-CM | POA: Diagnosis not present

## 2020-01-26 DIAGNOSIS — L02611 Cutaneous abscess of right foot: Secondary | ICD-10-CM | POA: Diagnosis not present

## 2020-01-26 DIAGNOSIS — E1142 Type 2 diabetes mellitus with diabetic polyneuropathy: Secondary | ICD-10-CM

## 2020-01-26 NOTE — Progress Notes (Signed)
Office Visit Note   Patient: [name]           Date of Birth: [birth year]           MRN: [id number] Visit Date: 01/26/2020              Requested by: Shelda Pal, [address] [place],  [place] [address] PCP: Shelda Pal, DO  Chief Complaint  Patient presents with  . Right Foot - Pain      HPI: Patient is a 59 year old woman who is seen for initial evaluation for abscess osteomyelitis ulceration right foot.  Patient has been started on clindamycin.  Patient complains of odor and drainage.  Assessment & Plan: Visit Diagnoses:  1. Subacute osteomyelitis, right ankle and foot ([place])   2. Cutaneous abscess of right foot     Plan: With the large area of the ulcer discussed that we will try to maximize foot salvage intervention.  At best will plan for a fourth and fifth ray amputation if there is not enough healthy tissue we may need to proceed with a transmetatarsal amputation.  Patient will be admitted after surgery placed on IV antibiotics and will adjust long-term antibiotics pending the result of soft tissue cultures.  Patient states she has had a history of C. Difficile.  Patient states that family has recommended that she get a second opinion in [facility].  I Have recommended the patient get the second opinion either today or tomorrow and will plan for surgery on Wednesday.  Would not recommend delay surgery past Wednesday.  Follow-Up Instructions: Return in about 2 weeks (around 02/09/2020).   Ortho Exam  Patient is alert, oriented, no adenopathy, well-dressed, normal affect, normal respiratory effort. Examination patient has a strong dorsalis pedis pulse she has cellulitis to the midfoot.  Review of her radiographs shows destructive bony changes through the fifth metatarsal neck secondary to the osteomyelitis.  She does have peripheral vascular disease with calcification of the arteries out to the metatarsal heads.  Patient has a  large necrotic wound that is 5 cm in diameter there is exposed bone of the fifth metatarsal with purulent drainage.  Patient's last hemoglobin A1c was 9.1 she states that her sugars have been elevated recently.  Patient has lymphatic and venous insufficiency of both lower extremities currently wrapped in an Ace wrap.  Patient ambulates in a wheelchair BMI 59.  Imaging: No results found. No images are attached to the encounter.  Labs: Lab Results  Component Value Date   HGBA1C 9.1 (H) 08/15/2019   HGBA1C 8.4 (H) 01/02/2018   HGBA1C 8.3 (H) 05/16/2016   ESRSEDRATE 37 (H) 01/02/2018   CRP 9.8 (H) 01/02/2018   REPTSTATUS 10/18/2018 FINAL 10/18/2018   CULT  10/17/2018    NO GROWTH 5 DAYS Performed at [facility], [place] [address] [place], [place] [address]    LABORGA GROUP B STREP(S.AGALACTIAE)ISOLATED 10/17/2018     Lab Results  Component Value Date   ALBUMIN 3.2 (L) 01/21/2020   ALBUMIN 3.8 08/01/2019   ALBUMIN 3.3 (L) 10/19/2018    Lab Results  Component Value Date   MG 2.2 10/20/2018   MG 2.2 10/19/2018   MG 1.2 (L) 10/17/2018   Lab Results  Component Value Date   VD25OH 69.04 07/25/2017    No results found for: PREALBUMIN CBC EXTENDED Latest Ref Rng & Units 01/21/2020 01/21/2020 08/01/2019  WBC 4.0 - 10.5 K/uL - 9.2 10.0  RBC 3.87 - 5.11 MIL/uL - 4.57 4.89  HGB 12.0 - 15.0 g/dL 65.6 11.4(L) 12.4  HCT 36.0 - 46.0 % 37.0 37.7 41.7  PLT 150 - 400 K/uL - 369 270  NEUTROABS 1.7 - 7.7 K/uL - 5.9 3.9  LYMPHSABS 0.7 - 4.0 K/uL - 2.0 3.7     Body mass index is 59.26 kg/m.  Orders:  No orders of the defined types were placed in this encounter.  No orders of the defined types were placed in this encounter.    Procedures: No procedures performed  Clinical Data: No additional findings.  ROS:  All other systems negative, except as noted in the HPI. Review of Systems  Objective: Vital Signs: Ht 5\' 2"  (1.575 m)   Wt (!) 324 lb (147 kg)   BMI 59.26 kg/m    Specialty Comments:  No specialty comments available.  PMFS History: Patient Active Problem List   Diagnosis Date Noted  . Statin intolerance 08/16/2019  . PAF (paroxysmal atrial fibrillation) (HCC) 01/06/2019  . Gram-negative bacteremia 10/18/2018  . Streptococcal bacteremia 10/18/2018  . Ulcer of lower extremity, limited to breakdown of skin (HCC) 10/03/2018  . CAD S/P percutaneous coronary angioplasty 04/19/2018  . Essential hypertension   . Moderate persistent asthma 03/21/2018  . NAFLD (nonalcoholic fatty liver disease)   . Recurrent cellulitis of lower extremity 01/02/2018  . Cellulitis of lower leg 01/02/2018  . Chronic diarrhea 05/08/2017  . H/O Clostridium difficile infection 05/08/2017  . Rectal bleeding 05/08/2017  . Hyperbilirubinemia 04/29/2016  . Hyponatremia 04/29/2016  . Cellulitis of leg, right 03/09/2016  . Mild persistent asthma 02/15/2016  . Allergic rhinitis due to pollen 02/15/2016  . Anaphylactic reaction due to food 02/10/2016  . Coronary artery disease involving native coronary artery of native heart without angina pectoris 02/10/2016  . Gastroesophageal reflux disease without esophagitis 02/10/2016  . Atopic eczema 02/10/2016  . Cervical nerve root disorder 12/15/2015  . Lumbar radiculopathy 12/15/2015  . Daytime somnolence 11/26/2015  . Venous stasis dermatitis of both lower extremities 02/11/2015  . Morbid obesity (HCC) 06/19/2014  . B-complex deficiency 03/26/2014  . Benign essential HTN 03/26/2014  . Chronic pain associated with significant psychosocial dysfunction 03/26/2014  . Diaphragmatic hernia 03/26/2014  . Gastroesophageal reflux disease 03/26/2014  . Hammer toe 03/26/2014  . H/O neoplasm 03/26/2014  . 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 Diabetic polyneuropathy (HCC) 03/26/2014  . Deafness, sensorineural 03/26/2014  . Fibromyalgia 01/20/2014  . 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 Insulin dependent diabetes mellitus  (HCC) 11/11/2012  . Hyperlipidemia LDL goal <70 11/11/2012  . Mixed connective tissue disease (HCC) 11/11/2012  . Hypothyroidism 11/11/2012  . Uncomplicated asthma 11/11/2012  . 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HCC) 02/20/2012  . 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 ANA positive 07/17/2011   Past Medical History:  Diagnosis Date  . Asthma    "on daily RX and rescue inhaler" (04/18/2018)  . Chronic diastolic CHF (congestive heart failure) (HCC) 09/2015  . Chronic lower back pain   . Chronic neck pain   . Chronic pain syndrome    Fentanyl Patch  . Colon polyps   . Coronary artery disease    cath with normal LM, 30% LAD, 85% mid RCA and 95% distal RCA s/p PCI of the mid to distal RCA and now on DAPT with ASA and Ticagrelor.    . Eczema   . Excessive daytime sleepiness 11/26/2015  . Fibromyalgia   .  Gallstones   . GERD (gastroesophageal reflux disease)   . Heart murmur    "noted for the 1st time on 04/18/2018"  . History of blood transfusion 07/2010   "S/P oophorectomy"  . History of gout   . History of hiatal hernia 1980s   "gone now" (04/18/2018)  . Hyperlipidemia   . Hypertension    takes Metoprolol and Enalapril daily  . Hypothyroidism    takes Synthroid daily  . IBS (irritable bowel syndrome)   . Migraine    "nothing in the 2000s" (04/18/2018)  . Mixed connective tissue disease (HCC)   . NAFLD (nonalcoholic fatty liver disease)   . Pneumonia    "several times" (04/18/2018)  . Rheumatoid arthritis (HCC)    "hands, elbows, shoulders, probably knees" (04/18/2018)  . Scoliosis   . Spondylosis   . Type II diabetes mellitus (HCC)    takes Metformin and Hum R daily (04/18/2018)    Family History  Problem Relation Age of Onset  . CAD Mother   . Hypertension Mother   . Heart attack Mother   . CAD Father   . Heart attack Father   . Allergic rhinitis Father   . Asthma Father   . Hypertension Brother   .  Hypertension Brother   . Pancreatic cancer Paternal Aunt   . Breast cancer Paternal Aunt   . Lung cancer Paternal Aunt     Past Surgical History:  Procedure Laterality Date  . ABDOMINAL HYSTERECTOMY  06/2005   "w/right ovariy"  . APPENDECTOMY    . BREAST BIOPSY Bilateral    9 total (04/18/2018)  . CORONARY ANGIOPLASTY WITH STENT PLACEMENT  10/01/2015   normal LM, 30% LAD, 85% mid RCA and 95% distal RCA s/p PCI of the mid to distal RCA and now on DAPT with ASA and Ticagrelor.    . CORONARY STENT INTERVENTION Right 04/18/2018   Procedure: CORONARY STENT INTERVENTION;  Surgeon: [name], MD;  Location: [facility];  Service: Cardiovascular;  Laterality: Right;  . DILATION AND CURETTAGE OF UTERUS    . FRACTURE SURGERY    . LAPAROSCOPIC CHOLECYSTECTOMY    . LEFT HEART CATH AND CORONARY ANGIOGRAPHY N/A 04/18/2018   Procedure: LEFT HEART CATH AND CORONARY ANGIOGRAPHY;  Surgeon: [name], MD;  [facility]: [facility];  Service: Cardiovascular;  Laterality: N/A;  . MUSCLE BIOPSY Left    "leg"  . OOPHORECTOMY  07/2010  . RADIOLOGY WITH ANESTHESIA N/A 05/25/2016   Procedure: RADIOLOGY WITH ANESTHESIA;  Surgeon: Medication Radiologist, MD;  Location: [facility] OR;  Service: Radiology;  Laterality: N/A;  . TONSILLECTOMY AND ADENOIDECTOMY    . WRIST FRACTURE SURGERY Left    "crushed it"   Social History   Occupational History  . Not on file  Tobacco Use  . Smoking status: Former Smoker    Packs/day: 1.00    Years: 20.00    Pack years: 20.00    Types: Cigarettes    Quit date: 02/11/2004    Years since quitting: 15.9  . Smokeless tobacco: Never Used  Substance and Sexual Activity  . Alcohol use: Yes    Comment: 04/18/2018 "couple drinks/year"  . Drug use: Not Currently    Types: Marijuana    Comment: "only in my teens"  . Sexual activity: Yes    Birth control/protection: Surgical

## 2020-01-26 NOTE — Telephone Encounter (Signed)
Noted in referral/order MRI no longer needed per W. MAY. Patient is scheduled for surgery.

## 2020-01-27 ENCOUNTER — Encounter (HOSPITAL_COMMUNITY): Payer: Self-pay | Admitting: Orthopedic Surgery

## 2020-01-27 ENCOUNTER — Other Ambulatory Visit: Payer: Self-pay

## 2020-01-27 ENCOUNTER — Other Ambulatory Visit (HOSPITAL_COMMUNITY)
Admission: RE | Admit: 2020-01-27 | Discharge: 2020-01-27 | Disposition: A | Discharge status: Home / Self Care | Payer: 59 | Source: Ambulatory Visit | Attending: Orthopedic Surgery | Admitting: Orthopedic Surgery

## 2020-01-27 DIAGNOSIS — Z01812 Encounter for preprocedural laboratory examination: Secondary | ICD-10-CM | POA: Insufficient documentation

## 2020-01-27 DIAGNOSIS — Z20822 Contact with and (suspected) exposure to covid-19: Secondary | ICD-10-CM | POA: Insufficient documentation

## 2020-01-27 LAB — SARS CORONAVIRUS 2 (TAT 6-24 HRS): SARS Coronavirus 2: NEGATIVE

## 2020-01-27 MED ORDER — DEXTROSE 5 % IV SOLN
3.0000 g | INTRAVENOUS | Status: AC
  Administered 2020-01-28: 3 g via INTRAVENOUS
  Filled 2020-01-27: qty 3
  Filled 2020-01-27: qty 3000

## 2020-01-27 NOTE — Progress Notes (Addendum)
Your procedure is scheduled on Wednesday May 19.  Report to [facility] Main Entrance "A" at 10:30 A.M., and check in at the Admitting office.  Call this number if you have problems the morning of surgery: [phone]  Remember: Do not eat after midnight the night before your surgery  You may drink clear liquids until 10:00 A.M the morning of your surgery.   Clear liquids allowed are: Water, Non-Citrus Juices (without pulp), Carbonated Beverages, Clear Tea, Black Coffee Only, and Gatorade   Take these medicines the morning of surgery with A SIP OF WATER: diltiazem (CARDIZEM CD) gabapentin (NEURONTIN)  isosorbide mononitrate (IMDUR) levothyroxine (SYNTHROID, LEVOTHROID) pantoprazole (PROTONIX)   If needed: acetaminophen (TYLENOL)  albuterol (PROVENTIL) (2.5 MG/3ML) diphenhydrAMINE (BENADRYL)  EPINEPHrine (EPIPEN 2-PAK)  nitroGLYCERIN (NITROSTAT) Oxycodone  Inhaler ---- Please bring all inhalers with you the day of surgery.     As of today, STOP taking any Aspirin (unless otherwise instructed by your surgeon), Aleve, Naproxen, Ibuprofen, Motrin, Advil, Goody's, BC's, all herbal medications, fish oil, and all vitamins.  Follow your surgeon's instructions on when to stop Aspirin and clopidogrel (PLAVIX).  If no instructions were given by your surgeon then you will need to call the office to get those instructions.     WHAT DO I DO ABOUT MY DIABETES MEDICATION?   Marland Kitchen Do not take oral diabetes medicines (pills) the morning of surgery.  . THE DAY BEFORE SURGERY o metFORMIN (GLUCOPHAGE-XR) - take as usual o insulin regular human CONCENTRATED (HUMULIN R)  - AM dose - take as usual - PM dose - NONE   . THE MORNING OF SURGERY o metFORMIN (GLUCOPHAGE-XR) - NONE - no oral diabetic medications day of surgery o insulin regular human CONCENTRATED (HUMULIN R) - NONE  - HOWEVER, If your CBG is greater than 220 mg/dL, you may take  of your sliding scale (correction) dose of  insulin.  . The day of surgery, do not take other diabetes injectables, including Byetta (exenatide), Bydureon (exenatide ER), Victoza (liraglutide), or Trulicity (dulaglutide).   HOW TO MANAGE YOUR DIABETES BEFORE AND AFTER SURGERY  How do I manage my blood sugar before surgery? . Check your blood sugar at least 4 times a day, starting 2 days before surgery, to make sure that the level is not too high or low. . Check your blood sugar the morning of your surgery when you wake up and every 2 hours until you get to the Short Stay unit. o If your blood sugar is less than 70 mg/dL, you will need to treat for low blood sugar: - Do not take insulin. - Treat a low blood sugar (less than 70 mg/dL) with  cup of clear juice (cranberry or apple), 4 glucose tablets, OR glucose gel. - Recheck blood sugar in 15 minutes after treatment (to make sure it is greater than 70 mg/dL). If your blood sugar is not greater than 70 mg/dL on recheck, call [phone] for further instructions. . Report your blood sugar to the short stay nurse when you get to Short Stay.     The Morning of Surgery  Do not wear jewelry, make-up or nail polish.  Do not wear lotions, powders, or perfumes, or deodorant  Do not shave 48 hours prior to surgery.    Do not bring valuables to the hospital.  [facility] is not responsible for any belongings or valuables.  If you are a smoker, DO NOT Smoke 24 hours prior to surgery  If you wear a  CPAP at night please bring your mask the morning of surgery   Remember that you must have someone to transport you home after your surgery, and remain with you for 24 hours if you are discharged the same day.  Please bring cases for contacts, glasses, hearing aids, dentures or bridgework because it cannot be worn into surgery.    Leave your suitcase in the car.  After surgery it may be brought to your room.  For patients admitted to the hospital, discharge time will be determined by your  treatment team.  Patients discharged the day of surgery will not be allowed to drive home.    Day of Surgery:  Please shower the morning of surgery with the CHG soap Do not apply any deodorants/lotions. Please wear clean clothes to the hospital/surgery center.   Remember to brush your teeth WITH YOUR REGULAR TOOTHPASTE.   Please read over the following fact sheets that you were given.     PCP Nani Ravens, MD  Cardiologist - Golden Hurter, MD  PPM/ICD - n/a  Chest x-ray - 01/21/20 - 1 view EKG - 01/21/20 Stress Test - 04/08/18 ECHO - 04/09/18 Cardiac Cath - 04/18/18  Sleep Study - yes - no SA CPAP - no  Fasting Blood Sugar - 228 Checks Blood Sugar 4x/day - per pt, she ranges 100-200s >> recent cortisone shot in hospital put her sugar at 451 per pt - she had forgotten to adjust her insulin shot  Blood Thinner Instructions: Follow your surgeon's instructions on when to stop Aspirin and Plavix.  If no instructions were given by your surgeon then you will need to call the office to get those instructions.    Aspirin Instructions: see above  ERAS Protcol - yes PRE-SURGERY Ensure or G2- n/a  COVID TEST- 01/27/20  Coronavirus Screening  Have you experienced the following symptoms:  Cough yes/no: Yes Fever (>100.33F)  yes/no: No Runny nose yes/no: No Sore throat yes/no: No Difficulty breathing/shortness of breath  yes/no: Yes  Have you or a family member traveled in the last 14 days and where? yes/no: No   >> [name], [place] called about pt symptoms.  Per pt during phone call, "I've had this coughing and on and off fevers for about 5 weeks now. I've gone to high [facility] and I've been through 4 rounds of antibiotics and been on prednisone. Nothing has worked."  Throughout phone call, pt was wheezing and coughing, occasionally short of breath from coughing. Pt temp during call was 99.75F (pt checked herself).   If the patient indicates "YES" to the above questions, their PAT  will be rescheduled to limit the exposure to others and, the surgeon will be notified. THE PATIENT WILL NEED TO BE ASYMPTOMATIC FOR 14 DAYS.   If the patient is not experiencing any of these symptoms, the PAT nurse will instruct them to NOT bring anyone with them to their appointment since they may have these symptoms or traveled as well.   Please remind your patients and families that hospital visitation restrictions are in effect and the importance of the restrictions.     Anesthesia review: yes   All instructions explained to the patient, with a verbal understanding of the material. Patient agrees to go over the instructions while at home for a better understanding. Patient also instructed to self quarantine after being tested for COVID-19. The opportunity to ask questions was provided.

## 2020-01-27 NOTE — Progress Notes (Signed)
Anesthesia Chart Review: Same day workup  Patient with cough and shortness of breath ongoing for over a month.  She has been treated extensively as an outpatient with 3 courses of steroids as well as a course of doxycycline and ciprofloxacin.  She was seen in the emergency department on 01/21/2020 for persistent cough shortness of breath.  Initially it was felt she was going to need to be admitted for increased work of breathing however she improved significantly after treatment with albuterol, IV magnesium, and Solu-Medrol.  Work-up was otherwise unremarkable.  Her BNP was unremarkable, lactate 1.4, troponins flat, Covid test negative.  Chest x-ray showed some cephalization of the pulmonary vasculature, but no frank pulmonary edema.  PAT RN Gregery Na reported that during same-day work-up phone call patient remains short of breath with persistent dry cough.  Patient reported compliance with all prescribed medications, stated inhalers were not helping.  I advised that if she feels she is acutely worse the patient should be seen in the emergency department otherwise she will be evaluated by anesthesia team on day of surgery.  She understands that if her condition is not amenable to surgery she is at risk for postponement.  I have also called Dr. Jess Barters office to relay this information.  Patient also reports being poorly controlled diabetic, with blood glucose ranging between 200-400.  Last A1c in epic was 9.1 on 08/15/2019.  Followed by cardiology for history of CAD status post multiple PCI - cath 04/2018 showedsevere stenosis of the ostial RCA, 99% stenosed, treated with successful PCI + DES placement. The previously placed stents in the mid and distal RCA were patent withonlyminimal stenosis. The LAD had only mild, 20%, disease.  She also has a history of PAF with RVR in the setting of sepsis and bacteremia that converted back to normal sinus rhythm.  She was not anticoagulated because of the brief  nature of her A. fib and acute infection.  Patient was seen by Dr. Radford Pax 03/17/2019 at which time she ordered an event monitor and assess for silent A. fib which showed normal sinus rhythm with occasional nonsustained atrial tachycardia -based on this her Cardizem was increased to 300 mg daily.  In December 2020 Dr. Radford Pax ordered a Lexiscan Myoview to evaluate some equivocal anginal symptoms.  Test was done 08/21/2019 and showed EF 53%, no ischemia, low risk study.  C-Met, CBC and ABGs from ED visit 01/21/2020 reviewed, mild anemia hemoglobin 11.4, mild hyponatremia sodium 134, otherwise unremarkable.    Patient will need day of surgery evaluation  EKG 01/21/20: Sinus tach 122.  No acute ST-T wave changes.  Somewhat difficult to interpret due to significant baseline wander  Nuclear stress 08/21/2019:  Nuclear stress EF: 53%. The left ventricular ejection fraction is mildly decreased (45-54%).  There was no ST segment deviation noted during stress.  This is a low risk study. There is no evidence of ischemia or previous infarction.  The study is normal.  Cath and PCI 04/18/2018: 1. Severe stenosis ostial RCA. 2. Patent stents mid and distal RCA with minimal restenosis.  3. Mild non-obstructive disease in the LAD and Circumflex 4. Successful PTCA/DES x 1 ostial RCA  Recommendations:  Recommend uninterrupted dual antiplatelet therapy with Aspirin '81mg'$  daily and Clopidogrel '75mg'$  daily for a minimum of 6 months (stable ischemic heart disease - Class I recommendation). If she tolerates, DAPT, would continue for one year.   Wynonia Musty [facility] Short Stay Center/Anesthesiology Phone [phone] 01/27/2020 4:31 PM

## 2020-01-27 NOTE — Anesthesia Preprocedure Evaluation (Addendum)
Anesthesia Evaluation  Patient identified by MRN, date of birth, ID band Patient awake    Reviewed: Allergy & Precautions, NPO status , Patient's Chart, lab work & pertinent test results  Airway Mallampati: III   Neck ROM: Full    Dental  (+) Chipped, Teeth Intact, Poor Dentition,    Pulmonary former smoker,    breath sounds clear to auscultation       Cardiovascular hypertension,  Rhythm:Regular Rate:Normal     Neuro/Psych    GI/Hepatic   Endo/Other  diabetes  Renal/GU      Musculoskeletal   Abdominal (+) + obese,   Peds  Hematology   Anesthesia Other Findings   Reproductive/Obstetrics                            Anesthesia Physical Anesthesia Plan  ASA: III  Anesthesia Plan: MAC   Post-op Pain Management:  Regional for Post-op pain   Induction:   PONV Risk Score and Plan:   Airway Management Planned: Natural Airway and Simple Face Mask  Additional Equipment:   Intra-op Plan:   Post-operative Plan:   Informed Consent: I have reviewed the patients History and Physical, chart, labs and discussed the procedure including the risks, benefits and alternatives for the proposed anesthesia with the patient or authorized representative who has indicated his/her understanding and acceptance.     Dental advisory given  Plan Discussed with: CRNA and Anesthesiologist  Anesthesia Plan Comments: (PAT note by Karoline Caldwell, PA-C: Patient with cough and shortness of breath ongoing for over a month.  She has been treated extensively as an outpatient with 3 courses of steroids as well as a course of doxycycline and ciprofloxacin.  She was seen in the emergency department on 01/21/2020 for persistent cough shortness of breath.  Initially it was felt she was going to need to be admitted for increased work of breathing however she improved significantly after treatment with albuterol, IV magnesium,  and Solu-Medrol.  Work-up was otherwise unremarkable.  Her BNP was unremarkable, lactate 1.4, troponins flat, Covid test negative.  Chest x-ray showed some cephalization of the pulmonary vasculature, but no frank pulmonary edema.  PAT RN Gregery Na reported that during same-day work-up phone call patient remains short of breath with persistent dry cough.  Patient reported compliance with all prescribed medications, stated inhalers were not helping.  I advised that if she feels she is acutely worse the patient should be seen in the emergency department otherwise she will be evaluated by anesthesia team on day of surgery.  She understands that if her condition is not amenable to surgery she is at risk for postponement.  I have also called Dr. Jess Barters office to relay this information.  Patient also reports being poorly controlled diabetic, with blood glucose ranging between 200-400.  Last A1c in epic was 9.1 on 08/15/2019.  Followed by cardiology for history of CAD status post multiple PCI - cath 04/2018 showedsevere stenosis of the ostial RCA, 99% stenosed, treated with successful PCI + DES placement. The previously placed stents in the mid and distal RCA were patent withonlyminimal stenosis. The LAD had only mild, 20%, disease.  She also has a history of PAF with RVR in the setting of sepsis and bacteremia that converted back to normal sinus rhythm.  She was not anticoagulated because of the brief nature of her A. fib and acute infection.  Patient was seen by Dr. Radford Pax 03/17/2019 at which time she  ordered an event monitor and assess for silent A. fib which showed normal sinus rhythm with occasional nonsustained atrial tachycardia -based on this her Cardizem was increased to 300 mg daily.  In December 2020 Dr. Radford Pax ordered a Lexiscan Myoview to evaluate some equivocal anginal symptoms.  Test was done 08/21/2019 and showed EF 53%, no ischemia, low risk study.  C-Met, CBC and ABGs from ED visit 01/21/2020  reviewed, mild anemia hemoglobin 11.4, mild hyponatremia sodium 134, otherwise unremarkable.    Patient will need day of surgery evaluation  EKG 01/21/20: Sinus tach 122.  No acute ST-T wave changes.  Somewhat difficult to interpret due to significant baseline wander  Nuclear stress 08/21/2019: Nuclear stress EF: 53%. The left ventricular ejection fraction is mildly decreased (45-54%). There was no ST segment deviation noted during stress. This is a low risk study. There is no evidence of ischemia or previous infarction. The study is normal.  Cath and PCI 04/18/2018: 1. Severe stenosis ostial RCA. 2. Patent stents mid and distal RCA with minimal restenosis.  3. Mild non-obstructive disease in the LAD and Circumflex 4. Successful PTCA/DES x 1 ostial RCA  Recommendations:  Recommend uninterrupted dual antiplatelet therapy with Aspirin 78m daily and Clopidogrel 788mdaily for a minimum of 6 months (stable ischemic heart disease - Class I recommendation). If she tolerates, DAPT, would continue for one year.  )       Anesthesia Quick Evaluation

## 2020-01-28 ENCOUNTER — Inpatient Hospital Stay (HOSPITAL_COMMUNITY): Payer: 59 | Admitting: Physician Assistant

## 2020-01-28 ENCOUNTER — Encounter (HOSPITAL_COMMUNITY): Payer: Self-pay | Admitting: Orthopedic Surgery

## 2020-01-28 ENCOUNTER — Encounter (HOSPITAL_COMMUNITY)
Admission: RE | Disposition: A | Discharge status: Home / Self Care | Payer: Self-pay | Source: Home / Self Care | Attending: Orthopedic Surgery

## 2020-01-28 ENCOUNTER — Inpatient Hospital Stay (HOSPITAL_COMMUNITY)
Admission: RE | Admit: 2020-01-28 | Discharge: 2020-02-04 | DRG: 240 | Disposition: A | Discharge status: Home Health | Payer: 59 | Attending: Orthopedic Surgery | Admitting: Orthopedic Surgery

## 2020-01-28 ENCOUNTER — Other Ambulatory Visit: Payer: Self-pay

## 2020-01-28 ENCOUNTER — Ambulatory Visit: Payer: 59 | Admitting: Family Medicine

## 2020-01-28 DIAGNOSIS — L02611 Cutaneous abscess of right foot: Secondary | ICD-10-CM

## 2020-01-28 DIAGNOSIS — M069 Rheumatoid arthritis, unspecified: Secondary | ICD-10-CM | POA: Diagnosis present

## 2020-01-28 DIAGNOSIS — E1152 Type 2 diabetes mellitus with diabetic peripheral angiopathy with gangrene: Principal | ICD-10-CM | POA: Diagnosis present

## 2020-01-28 DIAGNOSIS — E039 Hypothyroidism, unspecified: Secondary | ICD-10-CM | POA: Diagnosis present

## 2020-01-28 DIAGNOSIS — B9689 Other specified bacterial agents as the cause of diseases classified elsewhere: Secondary | ICD-10-CM

## 2020-01-28 DIAGNOSIS — I2510 Atherosclerotic heart disease of native coronary artery without angina pectoris: Secondary | ICD-10-CM | POA: Diagnosis present

## 2020-01-28 DIAGNOSIS — Z888 Allergy status to other drugs, medicaments and biological substances status: Secondary | ICD-10-CM

## 2020-01-28 DIAGNOSIS — K760 Fatty (change of) liver, not elsewhere classified: Secondary | ICD-10-CM | POA: Diagnosis present

## 2020-01-28 DIAGNOSIS — J45909 Unspecified asthma, uncomplicated: Secondary | ICD-10-CM | POA: Diagnosis present

## 2020-01-28 DIAGNOSIS — G894 Chronic pain syndrome: Secondary | ICD-10-CM | POA: Diagnosis present

## 2020-01-28 DIAGNOSIS — Z87891 Personal history of nicotine dependence: Secondary | ICD-10-CM

## 2020-01-28 DIAGNOSIS — N179 Acute kidney failure, unspecified: Secondary | ICD-10-CM | POA: Diagnosis not present

## 2020-01-28 DIAGNOSIS — I110 Hypertensive heart disease with heart failure: Secondary | ICD-10-CM | POA: Diagnosis present

## 2020-01-28 DIAGNOSIS — I5032 Chronic diastolic (congestive) heart failure: Secondary | ICD-10-CM | POA: Diagnosis present

## 2020-01-28 DIAGNOSIS — B9561 Methicillin susceptible Staphylococcus aureus infection as the cause of diseases classified elsewhere: Secondary | ICD-10-CM | POA: Diagnosis present

## 2020-01-28 DIAGNOSIS — M86071 Acute hematogenous osteomyelitis, right ankle and foot: Secondary | ICD-10-CM | POA: Diagnosis present

## 2020-01-28 DIAGNOSIS — Z79890 Hormone replacement therapy (postmenopausal): Secondary | ICD-10-CM

## 2020-01-28 DIAGNOSIS — E785 Hyperlipidemia, unspecified: Secondary | ICD-10-CM | POA: Diagnosis present

## 2020-01-28 DIAGNOSIS — M797 Fibromyalgia: Secondary | ICD-10-CM | POA: Diagnosis present

## 2020-01-28 DIAGNOSIS — Z6841 Body Mass Index (BMI) 40.0 and over, adult: Secondary | ICD-10-CM

## 2020-01-28 DIAGNOSIS — M549 Dorsalgia, unspecified: Secondary | ICD-10-CM | POA: Diagnosis present

## 2020-01-28 DIAGNOSIS — Z20822 Contact with and (suspected) exposure to covid-19: Secondary | ICD-10-CM | POA: Diagnosis present

## 2020-01-28 DIAGNOSIS — I959 Hypotension, unspecified: Secondary | ICD-10-CM | POA: Diagnosis not present

## 2020-01-28 DIAGNOSIS — E1169 Type 2 diabetes mellitus with other specified complication: Secondary | ICD-10-CM | POA: Diagnosis present

## 2020-01-28 DIAGNOSIS — J208 Acute bronchitis due to other specified organisms: Secondary | ICD-10-CM

## 2020-01-28 DIAGNOSIS — Z7951 Long term (current) use of inhaled steroids: Secondary | ICD-10-CM

## 2020-01-28 DIAGNOSIS — E11621 Type 2 diabetes mellitus with foot ulcer: Secondary | ICD-10-CM | POA: Diagnosis present

## 2020-01-28 DIAGNOSIS — M86271 Subacute osteomyelitis, right ankle and foot: Secondary | ICD-10-CM

## 2020-01-28 DIAGNOSIS — B952 Enterococcus as the cause of diseases classified elsewhere: Secondary | ICD-10-CM | POA: Diagnosis present

## 2020-01-28 DIAGNOSIS — E871 Hypo-osmolality and hyponatremia: Secondary | ICD-10-CM | POA: Diagnosis present

## 2020-01-28 DIAGNOSIS — Z7982 Long term (current) use of aspirin: Secondary | ICD-10-CM

## 2020-01-28 DIAGNOSIS — Z8719 Personal history of other diseases of the digestive system: Secondary | ICD-10-CM

## 2020-01-28 DIAGNOSIS — Z79899 Other long term (current) drug therapy: Secondary | ICD-10-CM

## 2020-01-28 DIAGNOSIS — Z91048 Other nonmedicinal substance allergy status: Secondary | ICD-10-CM

## 2020-01-28 DIAGNOSIS — M351 Other overlap syndromes: Secondary | ICD-10-CM | POA: Diagnosis present

## 2020-01-28 DIAGNOSIS — Z91018 Allergy to other foods: Secondary | ICD-10-CM

## 2020-01-28 DIAGNOSIS — B964 Proteus (mirabilis) (morganii) as the cause of diseases classified elsewhere: Secondary | ICD-10-CM | POA: Diagnosis present

## 2020-01-28 DIAGNOSIS — Z955 Presence of coronary angioplasty implant and graft: Secondary | ICD-10-CM

## 2020-01-28 DIAGNOSIS — K219 Gastro-esophageal reflux disease without esophagitis: Secondary | ICD-10-CM | POA: Diagnosis present

## 2020-01-28 DIAGNOSIS — Z9109 Other allergy status, other than to drugs and biological substances: Secondary | ICD-10-CM

## 2020-01-28 DIAGNOSIS — Z794 Long term (current) use of insulin: Secondary | ICD-10-CM

## 2020-01-28 DIAGNOSIS — M419 Scoliosis, unspecified: Secondary | ICD-10-CM | POA: Diagnosis present

## 2020-01-28 DIAGNOSIS — Z7902 Long term (current) use of antithrombotics/antiplatelets: Secondary | ICD-10-CM

## 2020-01-28 HISTORY — PX: AMPUTATION: SHX166

## 2020-01-28 LAB — GLUCOSE, CAPILLARY
Glucose-Capillary: 224 mg/dL — ABNORMAL HIGH (ref 70–99)
Glucose-Capillary: 191 mg/dL — ABNORMAL HIGH (ref 70–99)
Glucose-Capillary: 178 mg/dL — ABNORMAL HIGH (ref 70–99)
Glucose-Capillary: 207 mg/dL — ABNORMAL HIGH (ref 70–99)

## 2020-01-28 LAB — HEMOGLOBIN A1C
Hgb A1c MFr Bld: 9 % — ABNORMAL HIGH (ref 4.8–5.6)
Mean Plasma Glucose: 211.6 mg/dL

## 2020-01-28 SURGERY — AMPUTATION, FOOT, PARTIAL
Anesthesia: Monitor Anesthesia Care | Site: Foot | Laterality: Right

## 2020-01-28 MED ORDER — ONDANSETRON HCL 4 MG/2ML IJ SOLN
4.0000 mg | Freq: Four times a day (QID) | INTRAMUSCULAR | Status: DC | PRN
  Filled 2020-01-28: qty 2

## 2020-01-28 MED ORDER — METFORMIN HCL ER 500 MG PO TB24
1000.0000 mg | ORAL_TABLET | Freq: Two times a day (BID) | ORAL | Status: DC
  Administered 2020-01-28 – 2020-02-02 (×10): 1000 mg via ORAL
  Filled 2020-01-28 (×10): qty 2

## 2020-01-28 MED ORDER — ALBUTEROL SULFATE (2.5 MG/3ML) 0.083% IN NEBU
INHALATION_SOLUTION | RESPIRATORY_TRACT | Status: AC
  Administered 2020-01-28: 2.5 mg via RESPIRATORY_TRACT
  Filled 2020-01-28: qty 3

## 2020-01-28 MED ORDER — METOCLOPRAMIDE HCL 5 MG/ML IJ SOLN
5.0000 mg | Freq: Three times a day (TID) | INTRAMUSCULAR | Status: DC | PRN
  Administered 2020-02-03 – 2020-02-04 (×3): 10 mg via INTRAVENOUS
  Filled 2020-01-28 (×4): qty 2

## 2020-01-28 MED ORDER — FUROSEMIDE 40 MG PO TABS
60.0000 mg | ORAL_TABLET | Freq: Two times a day (BID) | ORAL | Status: DC
  Administered 2020-01-28: 20 mg via ORAL
  Administered 2020-01-29 – 2020-02-02 (×7): 60 mg via ORAL
  Filled 2020-01-28 (×12): qty 1

## 2020-01-28 MED ORDER — OXYCODONE HCL 5 MG PO TABS
5.0000 mg | ORAL_TABLET | ORAL | Status: DC | PRN
  Administered 2020-01-28 – 2020-02-04 (×20): 10 mg via ORAL
  Filled 2020-01-28 (×21): qty 2

## 2020-01-28 MED ORDER — ONDANSETRON HCL 4 MG PO TABS
4.0000 mg | ORAL_TABLET | Freq: Four times a day (QID) | ORAL | Status: DC | PRN
  Administered 2020-01-31 – 2020-02-01 (×2): 4 mg via ORAL
  Filled 2020-01-28 (×2): qty 1

## 2020-01-28 MED ORDER — ALBUTEROL SULFATE (2.5 MG/3ML) 0.083% IN NEBU: 2.5000 mg | INHALATION_SOLUTION | RESPIRATORY_TRACT | Status: AC

## 2020-01-28 MED ORDER — VANCOMYCIN HCL 2000 MG/400ML IV SOLN
2000.0000 mg | Freq: Once | INTRAVENOUS | Status: AC
  Administered 2020-01-28: 2000 mg via INTRAVENOUS
  Filled 2020-01-28: qty 400

## 2020-01-28 MED ORDER — MIDAZOLAM HCL 2 MG/2ML IJ SOLN
INTRAMUSCULAR | Status: AC
  Filled 2020-01-28: qty 2

## 2020-01-28 MED ORDER — LIDOCAINE 2% (20 MG/ML) 5 ML SYRINGE
INTRAMUSCULAR | Status: DC | PRN
  Administered 2020-01-28: 100 mg via INTRAVENOUS

## 2020-01-28 MED ORDER — PANTOPRAZOLE SODIUM 40 MG PO TBEC
40.0000 mg | DELAYED_RELEASE_TABLET | Freq: Every day | ORAL | Status: DC
  Administered 2020-01-29 – 2020-02-04 (×7): 40 mg via ORAL
  Filled 2020-01-28 (×7): qty 1

## 2020-01-28 MED ORDER — POLYETHYL GLYCOL-PROPYL GLYCOL 0.4-0.3 % OP SOLN: Freq: Every day | OPHTHALMIC | Status: DC | PRN

## 2020-01-28 MED ORDER — 0.9 % SODIUM CHLORIDE (POUR BTL) OPTIME
TOPICAL | Status: DC | PRN
  Administered 2020-01-28: 1000 mL

## 2020-01-28 MED ORDER — FENTANYL CITRATE (PF) 250 MCG/5ML IJ SOLN
INTRAMUSCULAR | Status: AC
  Filled 2020-01-28: qty 5

## 2020-01-28 MED ORDER — METOCLOPRAMIDE HCL 5 MG PO TABS
5.0000 mg | ORAL_TABLET | Freq: Three times a day (TID) | ORAL | Status: DC | PRN
  Administered 2020-02-01 – 2020-02-03 (×5): 10 mg via ORAL
  Filled 2020-01-28 (×5): qty 2

## 2020-01-28 MED ORDER — OXYCODONE HCL 5 MG/5ML PO SOLN: 5.0000 mg | Freq: Once | ORAL | Status: DC | PRN

## 2020-01-28 MED ORDER — DOCUSATE SODIUM 100 MG PO CAPS
100.0000 mg | ORAL_CAPSULE | Freq: Two times a day (BID) | ORAL | Status: DC
  Administered 2020-01-28 – 2020-02-04 (×14): 100 mg via ORAL
  Filled 2020-01-28 (×14): qty 1

## 2020-01-28 MED ORDER — ACETAMINOPHEN 325 MG PO TABS
325.0000 mg | ORAL_TABLET | Freq: Four times a day (QID) | ORAL | Status: DC | PRN
  Administered 2020-02-01: 325 mg via ORAL
  Filled 2020-01-28: qty 1

## 2020-01-28 MED ORDER — FENTANYL CITRATE (PF) 100 MCG/2ML IJ SOLN
INTRAMUSCULAR | Status: DC | PRN
  Administered 2020-01-28: 25 ug via INTRAVENOUS

## 2020-01-28 MED ORDER — MIDAZOLAM HCL 2 MG/2ML IJ SOLN: 1.0000 mg | Freq: Once | INTRAMUSCULAR | Status: AC

## 2020-01-28 MED ORDER — FENTANYL CITRATE (PF) 100 MCG/2ML IJ SOLN
INTRAMUSCULAR | Status: AC
  Filled 2020-01-28: qty 2

## 2020-01-28 MED ORDER — FENTANYL CITRATE (PF) 100 MCG/2ML IJ SOLN
INTRAMUSCULAR | Status: AC
  Administered 2020-01-28: 100 ug via INTRAVENOUS
  Filled 2020-01-28: qty 2

## 2020-01-28 MED ORDER — INSULIN ASPART 100 UNIT/ML ~~LOC~~ SOLN
0.0000 [IU] | Freq: Three times a day (TID) | SUBCUTANEOUS | Status: DC
  Administered 2020-01-28: 3 [IU] via SUBCUTANEOUS

## 2020-01-28 MED ORDER — ONDANSETRON HCL 4 MG/2ML IJ SOLN
INTRAMUSCULAR | Status: DC | PRN
  Administered 2020-01-28: 4 mg via INTRAVENOUS

## 2020-01-28 MED ORDER — ONDANSETRON HCL 4 MG/2ML IJ SOLN: 4.0000 mg | Freq: Once | INTRAMUSCULAR | Status: DC | PRN

## 2020-01-28 MED ORDER — NITROGLYCERIN 0.4 MG SL SUBL: 0.4000 mg | SUBLINGUAL_TABLET | SUBLINGUAL | Status: DC | PRN

## 2020-01-28 MED ORDER — ENALAPRIL MALEATE 5 MG PO TABS
10.0000 mg | ORAL_TABLET | Freq: Two times a day (BID) | ORAL | Status: DC
  Administered 2020-01-28 – 2020-02-02 (×9): 10 mg via ORAL
  Filled 2020-01-28 (×10): qty 2

## 2020-01-28 MED ORDER — HYDROMORPHONE HCL 1 MG/ML IJ SOLN
0.5000 mg | INTRAMUSCULAR | Status: DC | PRN
  Administered 2020-01-28 – 2020-02-04 (×9): 1 mg via INTRAVENOUS
  Filled 2020-01-28 (×9): qty 1

## 2020-01-28 MED ORDER — DEXAMETHASONE SODIUM PHOSPHATE 10 MG/ML IJ SOLN
INTRAMUSCULAR | Status: AC
  Filled 2020-01-28: qty 2

## 2020-01-28 MED ORDER — OXYCODONE HCL 5 MG PO TABS: 5.0000 mg | ORAL_TABLET | Freq: Once | ORAL | Status: DC | PRN

## 2020-01-28 MED ORDER — LEVOTHYROXINE SODIUM 100 MCG PO TABS
200.0000 ug | ORAL_TABLET | Freq: Every day | ORAL | Status: DC
  Administered 2020-01-29 – 2020-02-04 (×7): 200 ug via ORAL
  Filled 2020-01-28 (×7): qty 2

## 2020-01-28 MED ORDER — MIDAZOLAM HCL 5 MG/5ML IJ SOLN
INTRAMUSCULAR | Status: DC | PRN
Start: 2020-01-28 — End: 2020-01-28
  Administered 2020-01-28: 2 mg via INTRAVENOUS

## 2020-01-28 MED ORDER — SODIUM CHLORIDE 0.9 % IV SOLN: INTRAVENOUS | Status: DC

## 2020-01-28 MED ORDER — FENTANYL CITRATE (PF) 100 MCG/2ML IJ SOLN
25.0000 ug | INTRAMUSCULAR | Status: DC | PRN
  Administered 2020-01-28 (×2): 50 ug via INTRAVENOUS

## 2020-01-28 MED ORDER — ISOSORBIDE MONONITRATE ER 30 MG PO TB24
30.0000 mg | ORAL_TABLET | Freq: Every day | ORAL | Status: DC
  Administered 2020-01-29 – 2020-02-04 (×7): 30 mg via ORAL
  Filled 2020-01-28 (×7): qty 1

## 2020-01-28 MED ORDER — ALBUTEROL SULFATE (2.5 MG/3ML) 0.083% IN NEBU
2.5000 mg | INHALATION_SOLUTION | Freq: Four times a day (QID) | RESPIRATORY_TRACT | Status: DC | PRN
  Administered 2020-01-28 – 2020-02-02 (×8): 2.5 mg via RESPIRATORY_TRACT
  Filled 2020-01-28 (×10): qty 3

## 2020-01-28 MED ORDER — ONDANSETRON HCL 4 MG/2ML IJ SOLN
INTRAMUSCULAR | Status: AC
  Filled 2020-01-28: qty 2

## 2020-01-28 MED ORDER — FENTANYL CITRATE (PF) 100 MCG/2ML IJ SOLN: 100.0000 ug | Freq: Once | INTRAMUSCULAR | Status: AC

## 2020-01-28 MED ORDER — PROPOFOL 500 MG/50ML IV EMUL
INTRAVENOUS | Status: DC | PRN
  Administered 2020-01-28: 75 ug/kg/min via INTRAVENOUS

## 2020-01-28 MED ORDER — POTASSIUM CHLORIDE CRYS ER 10 MEQ PO TBCR
30.0000 meq | EXTENDED_RELEASE_TABLET | Freq: Two times a day (BID) | ORAL | Status: DC
  Administered 2020-01-28 – 2020-02-02 (×11): 30 meq via ORAL
  Filled 2020-01-28 (×12): qty 3

## 2020-01-28 MED ORDER — CLOPIDOGREL BISULFATE 75 MG PO TABS
75.0000 mg | ORAL_TABLET | Freq: Every day | ORAL | Status: DC
  Administered 2020-01-29 – 2020-02-04 (×7): 75 mg via ORAL
  Filled 2020-01-28 (×7): qty 1

## 2020-01-28 MED ORDER — ALBUTEROL SULFATE HFA 108 (90 BASE) MCG/ACT IN AERS: 2.0000 | INHALATION_SPRAY | RESPIRATORY_TRACT | Status: DC | PRN

## 2020-01-28 MED ORDER — MIDAZOLAM HCL 2 MG/2ML IJ SOLN
INTRAMUSCULAR | Status: AC
  Administered 2020-01-28: 1 mg via INTRAVENOUS
  Filled 2020-01-28: qty 2

## 2020-01-28 MED ORDER — PRAMIPEXOLE DIHYDROCHLORIDE 0.25 MG PO TABS
0.5000 mg | ORAL_TABLET | ORAL | Status: DC | PRN
  Administered 2020-01-29 – 2020-02-03 (×8): 0.5 mg via ORAL
  Filled 2020-01-28 (×10): qty 2

## 2020-01-28 MED ORDER — PIPERACILLIN-TAZOBACTAM 3.375 G IVPB
3.3750 g | Freq: Three times a day (TID) | INTRAVENOUS | Status: DC
  Administered 2020-01-28 – 2020-02-01 (×12): 3.375 g via INTRAVENOUS
  Filled 2020-01-28 (×11): qty 50

## 2020-01-28 MED ORDER — DILTIAZEM HCL ER COATED BEADS 180 MG PO CP24
300.0000 mg | ORAL_CAPSULE | Freq: Every day | ORAL | Status: DC
  Administered 2020-01-29 – 2020-02-03 (×6): 300 mg via ORAL
  Filled 2020-01-28 (×6): qty 1

## 2020-01-28 MED ORDER — HYDROXYCHLOROQUINE SULFATE 200 MG PO TABS
200.0000 mg | ORAL_TABLET | Freq: Two times a day (BID) | ORAL | Status: DC
  Administered 2020-01-28 – 2020-02-04 (×14): 200 mg via ORAL
  Filled 2020-01-28 (×15): qty 1

## 2020-01-28 MED ORDER — GABAPENTIN 300 MG PO CAPS
300.0000 mg | ORAL_CAPSULE | Freq: Three times a day (TID) | ORAL | Status: DC
  Administered 2020-01-28 – 2020-02-03 (×18): 300 mg via ORAL
  Filled 2020-01-28 (×18): qty 1

## 2020-01-28 MED ORDER — POLYVINYL ALCOHOL 1.4 % OP SOLN: 1.0000 [drp] | OPHTHALMIC | Status: DC | PRN

## 2020-01-28 MED ORDER — ASPIRIN EC 81 MG PO TBEC
81.0000 mg | DELAYED_RELEASE_TABLET | Freq: Every day | ORAL | Status: DC
  Administered 2020-01-29 – 2020-02-04 (×7): 81 mg via ORAL
  Filled 2020-01-28 (×7): qty 1

## 2020-01-28 SURGICAL SUPPLY — 33 items
BENZOIN TINCTURE PRP APPL 2/3 (GAUZE/BANDAGES/DRESSINGS) ×1 IMPLANT
BLADE SAW SGTL HD 18.5X60.5X1. (BLADE) ×2 IMPLANT
BLADE SURG 21 STRL SS (BLADE) ×2 IMPLANT
BNDG COHESIVE 4X5 TAN STRL (GAUZE/BANDAGES/DRESSINGS) ×1 IMPLANT
BNDG GAUZE ELAST 4 BULKY (GAUZE/BANDAGES/DRESSINGS) IMPLANT
COVER SURGICAL LIGHT HANDLE (MISCELLANEOUS) ×2 IMPLANT
COVER WAND RF STERILE (DRAPES) ×1 IMPLANT
DRAPE INCISE IOBAN 66X45 STRL (DRAPES) ×1 IMPLANT
DRAPE U-SHAPE 47X51 STRL (DRAPES) ×2 IMPLANT
DRSG ADAPTIC 3X8 NADH LF (GAUZE/BANDAGES/DRESSINGS) IMPLANT
DRSG PAD ABDOMINAL 8X10 ST (GAUZE/BANDAGES/DRESSINGS) IMPLANT
DURAPREP 26ML APPLICATOR (WOUND CARE) ×1 IMPLANT
ELECT REM PT RETURN 9FT ADLT (ELECTROSURGICAL) ×2
ELECTRODE REM PT RTRN 9FT ADLT (ELECTROSURGICAL) ×1 IMPLANT
GAUZE SPONGE 4X4 12PLY STRL (GAUZE/BANDAGES/DRESSINGS) IMPLANT
GLOVE BIOGEL PI IND STRL 9 (GLOVE) ×1 IMPLANT
GLOVE BIOGEL PI INDICATOR 9 (GLOVE) ×1
GLOVE SURG ORTHO 9.0 STRL STRW (GLOVE) ×2 IMPLANT
GOWN STRL REUS W/ TWL XL LVL3 (GOWN DISPOSABLE) ×3 IMPLANT
GOWN STRL REUS W/TWL XL LVL3 (GOWN DISPOSABLE) ×6
KIT BASIN OR (CUSTOM PROCEDURE TRAY) ×2 IMPLANT
KIT DRSG PREVENA PLUS 7DAY 125 (MISCELLANEOUS) ×1 IMPLANT
KIT TURNOVER KIT B (KITS) ×2 IMPLANT
NS IRRIG 1000ML POUR BTL (IV SOLUTION) ×2 IMPLANT
PACK ORTHO EXTREMITY (CUSTOM PROCEDURE TRAY) ×2 IMPLANT
PAD ARMBOARD 7.5X6 YLW CONV (MISCELLANEOUS) ×4 IMPLANT
SPONGE LAP 18X18 RF (DISPOSABLE) IMPLANT
SUT ETHILON 2 0 PSLX (SUTURE) ×4 IMPLANT
TOWEL GREEN STERILE (TOWEL DISPOSABLE) ×2 IMPLANT
TOWEL GREEN STERILE FF (TOWEL DISPOSABLE) ×2 IMPLANT
TUBE CONNECTING 12X1/4 (SUCTIONS) ×2 IMPLANT
WATER STERILE IRR 1000ML POUR (IV SOLUTION) ×2 IMPLANT
YANKAUER SUCT BULB TIP NO VENT (SUCTIONS) ×2 IMPLANT

## 2020-01-28 NOTE — Progress Notes (Signed)
Patient has incessant coughing, and requested a breathing tx because she "felt her asthma coming on". Notified Dr. Noreene Larsson who gave a verbal order for Albuterol breathing tx.

## 2020-01-28 NOTE — Progress Notes (Addendum)
o2 sat 71-76 on room air- set up oxygen at 2l [place] sat up to 92% she had dilaudid earlier- she states she could use resp-treatment  aa well

## 2020-01-28 NOTE — Transfer of Care (Signed)
Immediate Anesthesia Transfer of Care Note  Patient: [name]  Procedure(s) Performed: RIGHT FOURTH AND FIFTH RAY AMPUTATION, (Right Foot)  Patient Location: PACU  Anesthesia Type:MAC combined with regional for post-op pain  Level of Consciousness: awake, alert  and oriented  Airway & Oxygen Therapy: Patient Spontanous Breathing and Patient connected to face mask oxygen  Post-op Assessment: Report given to RN and Post -op Vital signs reviewed and stable  Post vital signs: Reviewed and stable  Last Vitals:  Vitals Value Taken Time  BP    Temp    Pulse 99 01/28/20 1343  Resp 27 01/28/20 1344  SpO2 100 % 01/28/20 1343  Vitals shown include unvalidated device data.  Last Pain:  Vitals:   01/28/20 1230  PainSc: 0-No pain      Patients Stated Pain Goal: 4 (94/07/68 0881)  Complications: No apparent anesthesia complications

## 2020-01-28 NOTE — Anesthesia Postprocedure Evaluation (Signed)
Anesthesia Post Note  Patient: [name]  Procedure(s) Performed: RIGHT FOURTH AND FIFTH RAY AMPUTATION, (Right Foot)     Patient location during evaluation: PACU Anesthesia Type: MAC Level of consciousness: awake and alert Pain management: pain level controlled Vital Signs Assessment: post-procedure vital signs reviewed and stable Respiratory status: spontaneous breathing, nonlabored ventilation, respiratory function stable and patient connected to nasal cannula oxygen Cardiovascular status: stable and blood pressure returned to baseline Postop Assessment: no apparent nausea or vomiting Anesthetic complications: no    Last Vitals:  Vitals:   01/28/20 1444 01/28/20 1536  BP: (!) 147/72 132/62  Pulse: (!) 102 100  Resp: 19 18  Temp: 36.7 C 37 C  SpO2: 92% 93%    Last Pain:  Vitals:   01/28/20 1536  TempSrc: Oral  PainSc:                  Sevan Mcbroom COKER

## 2020-01-28 NOTE — Anesthesia Procedure Notes (Addendum)
Anesthesia Regional Block: Ankle block   Pre-Anesthetic Checklist: ,, timeout performed, Correct Patient, Correct Site, Correct Laterality, Correct Procedure, Correct Position, site marked, Risks and benefits discussed,  Surgical consent,  Pre-op evaluation,  At surgeon's request and post-op pain management  Laterality: Right  Prep: chloraprep       Needles:  Injection technique: Single-shot      Additional Needles:   Narrative:  Start time: 01/28/2020 12:00 PM End time: 01/28/2020 12:10 PM Injection made incrementally with aspirations every 5 mL.  Performed by: Personally   Additional Notes: 30  cc 2% lidocaine plain injected easily

## 2020-01-28 NOTE — Progress Notes (Signed)
Pharmacy Antibiotic Note  [name] is a 59 y.o. female admitted on 01/28/2020 with RLE osteomyelitis, cellulitis  Pharmacy has been consulted for vancomycin and pip/tazo dosing.  Patient is s/p RLE I&D and wound vac placed in the OR. Cultures sent. Afebrile, WBC wnl. Good renal function.  5/19 Vancomycin 750mg  Q 12 hr Scr used: 0.99 mg/dL Weight: kg - IBW used  Vd coeff: 0.5 L/kg Est AUC: 506   Plan: Vancomycin 2g x1 then 750mg  Q12 hr Pip/tazo 3.375g Q8 hr  Monitor cultures, clinical status, renal fx Narrow abx as able, level as appropriate, and f/u duration    Height: 5\' 2"  (157.5 cm) Weight: 131.5 kg (290 lb) IBW/kg (Calculated) : 50.1  Temp (24hrs), Avg:98.6 F (37 C), Min:98.1 F (36.7 C), Max:99.1 F (37.3 C)  Recent Labs  Lab 01/21/20 1648 01/21/20 1705 01/21/20 1927  WBC 9.2  --   --   CREATININE 0.99  --   --   LATICACIDVEN  --  2.6* 1.4    Estimated Creatinine Clearance: 80.9 mL/min (by C-G formula based on SCr of 0.99 mg/dL).    Antimicrobials this admission: Vanc  5/19 >>  Pip/tazo 5/19 >>   Microbiology results: 5/19 OR tissue cx: pending     Thank you for allowing pharmacy to be a part of this patient's care.  6/19, PharmD, BCPS, BCCP Clinical Pharmacist  Please check AMION for all [facility] Pharmacy phone numbers After 10:00 PM, call Main Pharmacy [phone]

## 2020-01-28 NOTE — H&P (Signed)
[name] is an 59 y.o. female.   Chief Complaint: Right Foot Osteomyelitis HPI: Patient is a [name] who is seen for initial evaluation for abscess osteomyelitis ulceration right foot.  Patient has been started on clindamycin.  Patient complains of odor and drainage.  Past Medical History:  Diagnosis Date  . Asthma    "on daily RX and rescue inhaler" (04/18/2018)  . Chronic diastolic CHF (congestive heart failure) (HCC) 09/2015  . Chronic lower back pain   . Chronic neck pain   . Chronic pain syndrome    Fentanyl Patch  . Colon polyps   . Coronary artery disease    cath with normal LM, 30% LAD, 85% mid RCA and 95% distal RCA s/p PCI of the mid to distal RCA and now on DAPT with ASA and Ticagrelor.    . Eczema   . Excessive daytime sleepiness 11/26/2015  . Fibromyalgia   . Gallstones   . GERD (gastroesophageal reflux disease)   . Heart murmur    "noted for the 1st time on 04/18/2018"  . History of blood transfusion 07/2010   "S/P oophorectomy"  . History of gout   . History of hiatal hernia 1980s   "gone now" (04/18/2018)  . Hyperlipidemia   . Hypertension    takes Metoprolol and Enalapril daily  . Hypothyroidism    takes Synthroid daily  . IBS (irritable bowel syndrome)   . Migraine    "nothing in the 2000s" (04/18/2018)  . Mixed connective tissue disease (HCC)   . NAFLD (nonalcoholic fatty liver disease)   . Pneumonia    "several times" (04/18/2018)  . Rheumatoid arthritis (HCC)    "hands, elbows, shoulders, probably knees" (04/18/2018)  . Scoliosis   . Spondylosis   . Type II diabetes mellitus (HCC)    takes Metformin and Hum R daily (04/18/2018)    Past Surgical History:  Procedure Laterality Date  . ABDOMINAL HYSTERECTOMY  06/2005   "w/right ovariy"  . APPENDECTOMY    . BREAST BIOPSY Bilateral    9 total (04/18/2018)  . CORONARY ANGIOPLASTY WITH STENT PLACEMENT  10/01/2015   normal LM, 30% LAD, 85% mid RCA and 95% distal RCA s/p PCI of the mid to distal RCA  and now on DAPT with ASA and Ticagrelor.    . CORONARY STENT INTERVENTION Right 04/18/2018   Procedure: CORONARY STENT INTERVENTION;  Surgeon: [name], MD;  Location: [facility];  Service: Cardiovascular;  Laterality: Right;  . DILATION AND CURETTAGE OF UTERUS    . FRACTURE SURGERY    . LAPAROSCOPIC CHOLECYSTECTOMY    . LEFT HEART CATH AND CORONARY ANGIOGRAPHY N/A 04/18/2018   Procedure: LEFT HEART CATH AND CORONARY ANGIOGRAPHY;  Surgeon: [name], MD;  Location: [facility];  Service: Cardiovascular;  Laterality: N/A;  . MUSCLE BIOPSY Left    "leg"  . OOPHORECTOMY  07/2010  . RADIOLOGY WITH ANESTHESIA N/A 05/25/2016   Procedure: RADIOLOGY WITH ANESTHESIA;  Surgeon: Medication Radiologist, MD;  Location: [facility] OR;  Service: Radiology;  Laterality: N/A;  . TONSILLECTOMY AND ADENOIDECTOMY    . WRIST FRACTURE SURGERY Left    "crushed it"    Family History  Problem Relation Age of Onset  . CAD Mother   . Hypertension Mother   . Heart attack Mother   . CAD Father   . Heart attack Father   . Allergic rhinitis Father   . Asthma Father   . Hypertension Brother   .  Hypertension Brother   . Pancreatic cancer Paternal Aunt   . Breast cancer Paternal Aunt   . Lung cancer Paternal Aunt    Social History:  reports that she quit smoking about 15 years ago. Her smoking use included cigarettes. She has a 20.00 pack-year smoking history. She has never used smokeless tobacco. She reports current alcohol use. She reports previous drug use. Drug: Marijuana.  Allergies:  Allergies  Allergen Reactions  . Fish Allergy Anaphylaxis    INCLUDES OMEGA 3 OILS  . Fish Oil Anaphylaxis and Swelling    THROAT SWELLS INCLUDES FISH AS A CLASS  . Omega-3 Fatty Acids Swelling    Throat swelling    . Crestor [Rosuvastatin] Other (See Comments)    Extreme joint pain, to this & other statins  . Gabapentin Anxiety    Anxiety on high doses  . Lovastatin Other (See  Comments)    EXTREME JOINT PAIN  . Metronidazole Nausea Only and Swelling    Headache and shakes Nausea and vomiting  . Red Yeast Rice [Cholestin] Other (See Comments)    Muscle pain and severe joint pain.   . Rotigotine Swelling    Extreme edema   . Atrovent Hfa [Ipratropium Bromide Hfa] Other (See Comments)    wheezing  . Red Yeast Rice Extract     Joint pain  . Victoza [Liraglutide]     Unsure of reaction   . Suprep [Na Sulfate-K Sulfate-Mg Sulf] Nausea And Vomiting  . Tape Other (See Comments)    Electrodes causes skin breakdown    No medications prior to admission.    Results for orders placed or performed during the hospital encounter of 01/27/20 (from the past 48 hour(s))  SARS CORONAVIRUS 2 (TAT 6-24 HRS) Nasopharyngeal Nasopharyngeal Swab     Status: None   Collection Time: 01/27/20  1:42 PM   Specimen: Nasopharyngeal Swab  Result Value Ref Range   SARS Coronavirus 2 NEGATIVE NEGATIVE    Comment: (NOTE) SARS-CoV-2 target nucleic acids are NOT DETECTED. The SARS-CoV-2 RNA is generally detectable in upper and lower respiratory specimens during the acute phase of infection. Negative results do not preclude SARS-CoV-2 infection, do not rule out co-infections with other pathogens, and should not be used as the sole basis for treatment or other patient management decisions. Negative results must be combined with clinical observations, patient history, and epidemiological information. The expected result is Negative. Fact Sheet for Patients: HairSlick.no Fact Sheet for Healthcare Providers: quierodirigir.com This test is not yet approved or cleared by the [place] FDA and  has been authorized for detection and/or diagnosis of SARS-CoV-2 by FDA under an Emergency Use Authorization (EUA). This EUA will remain  in effect (meaning this test can be used) for the duration of the COVID-19 declaration under Section 56  4(b)(1) of the Act, 21 U.S.C. section 360bbb-3(b)(1), unless the authorization is terminated or revoked sooner. Performed at [facility], [address], [place], [place] [address]    No results found.  Review of Systems  All other systems reviewed and are negative.   Height 5\' 2"  (1.575 m), weight 133.8 kg. Physical Exam  .Patient is alert, oriented, no adenopathy, well-dressed, normal affect, normal respiratory effort. Examination patient has a strong dorsalis pedis pulse she has cellulitis to the midfoot.  Review of her radiographs shows destructive bony changes through the fifth metatarsal neck secondary to the osteomyelitis.  She does have peripheral vascular disease with calcification of the arteries out to the metatarsal  heads.  Patient has a large necrotic wound that is 5 cm in diameter there is exposed bone of the fifth metatarsal with purulent drainage.  Patient's last hemoglobin A1c was 9.1 she states that her sugars have been elevated recently.  Patient has lymphatic and venous insufficiency of both lower extremities currently wrapped in an Ace BMI is 59. Lungs clr Heart RRR Assessment/Plan With the large area of the ulcer discussed that we will try to maximize foot salvage intervention.  At best will plan for a fourth and fifth ray amputation if there is not enough healthy tissue we may need to proceed with a transmetatarsal amputation.  Patient will be admitted after surgery placed on IV antibiotics and will adjust long-term antibiotics pending the result of soft tissue cultures.  Patient states she has had a history of C. Difficile.  Patient states that family has recommended that she get a second opinion in [facility].  I Have recommended the patient get the second opinion either today or tomorrow and will plan for surgery on Wednesday  Anderson, PA 01/28/2020, 5:42 AM

## 2020-01-28 NOTE — Progress Notes (Signed)
1530 Received pt from PACU, A&O x4. Right foot with wound vac dressing dry and intact. Denies pain at this time.

## 2020-01-28 NOTE — Op Note (Signed)
01/28/2020  1:49 PM  PATIENT:  [name]    PRE-OPERATIVE DIAGNOSIS:  Abscess and Osteomyelitis Right Foot  POST-OPERATIVE DIAGNOSIS:  Same  PROCEDURE:  RIGHT FOURTH AND FIFTH RAY AMPUTATION, Local tissue rearrangement for wound closure 9 x 7 cm. Application of axial form wound VAC dressing  SURGEON:  [name], MD  PHYSICIAN ASSISTANT:None ANESTHESIA:   General  PREOPERATIVE INDICATIONS:  [name] is a  59 y.o. female with a diagnosis of Abscess and Osteomyelitis Right Foot who failed conservative measures and elected for surgical management.    The risks benefits and alternatives were discussed with the patient preoperatively including but not limited to the risks of infection, bleeding, nerve injury, cardiopulmonary complications, the need for revision surgery, among others, and the patient was willing to proceed.  OPERATIVE IMPLANTS: Axial form wound VAC dressing  @ENCIMAGES @  OPERATIVE FINDINGS: No deep infection extending proximal to the fourth and fifth rays however there was significant amount of cellulitis.  Tissue was sent for cultures patient would need at least 4 weeks of oral antibiotics.  OPERATIVE PROCEDURE: Patient was brought the operating room and underwent a regional anesthetic.  After adequate levels anesthesia were obtained patient's right lower extremity was prepped using ChloraPrep draped into a sterile field a timeout was called.  Elliptical incision was made around the fourth and fifth rays including the ulcerative tissue plantarly.  This left a wound that was 9 x 7 cm.  Tissue was sent for cultures the wound was irrigated with normal saline electrocautery was used for hemostasis.  Local tissue rearrangement was used to close the wound with 2-0 nylon 9 x 7 cm.  The axial form wound VAC was applied this had a good suction fit this was covered with Covan.  Patient was taken the PACU in stable condition   DISCHARGE PLANNING:  Antibiotic duration:  Continue antibiotics based on tissue culture sensitivity  Weightbearing: Nonweightbearing on the right  Pain medication: Opioid pathway  Dressing care/ Wound VAC: Continue wound VAC for 1 week  Ambulatory devices: Walker  Discharge : Physical therapy for discharge recommendations  Follow-up: In the office 1 week post operative.

## 2020-01-28 NOTE — Anesthesia Procedure Notes (Signed)
Procedure Name: MAC Date/Time: 01/28/2020 1:05 PM Performed by: Candis Shine, CRNA Pre-anesthesia Checklist: Patient identified, Emergency Drugs available, Suction available, Patient being monitored and Timeout performed Patient Re-evaluated:Patient Re-evaluated prior to induction Oxygen Delivery Method: Simple face mask Dental Injury: Teeth and Oropharynx as per pre-operative assessment

## 2020-01-29 LAB — GLUCOSE, CAPILLARY
Glucose-Capillary: 64 mg/dL — ABNORMAL LOW (ref 70–99)
Glucose-Capillary: 89 mg/dL (ref 70–99)
Glucose-Capillary: 151 mg/dL — ABNORMAL HIGH (ref 70–99)
Glucose-Capillary: 187 mg/dL — ABNORMAL HIGH (ref 70–99)
Glucose-Capillary: 208 mg/dL — ABNORMAL HIGH (ref 70–99)

## 2020-01-29 LAB — BASIC METABOLIC PANEL
Anion gap: 11 (ref 5–15)
BUN: 6 mg/dL (ref 6–20)
CO2: 25 mmol/L (ref 22–32)
Calcium: 8.7 mg/dL — ABNORMAL LOW (ref 8.9–10.3)
Chloride: 103 mmol/L (ref 98–111)
Creatinine, Ser: 1.19 mg/dL — ABNORMAL HIGH (ref 0.44–1.00)
GFR calc Af Amer: 58 mL/min — ABNORMAL LOW (ref >60)
GFR calc non Af Amer: 50 mL/min — ABNORMAL LOW (ref >60)
Glucose, Bld: 83 mg/dL (ref 70–99)
Potassium: 4 mmol/L (ref 3.5–5.1)
Sodium: 139 mmol/L (ref 135–145)

## 2020-01-29 MED ORDER — INSULIN REGULAR HUMAN (CONC) 500 UNIT/ML ~~LOC~~ SOPN
15.0000 [IU] | PEN_INJECTOR | Freq: Two times a day (BID) | SUBCUTANEOUS | Status: DC
  Administered 2020-01-29: 35 [IU] via SUBCUTANEOUS
  Administered 2020-01-30 (×2): 55 [IU] via SUBCUTANEOUS
  Administered 2020-01-31: 65 [IU] via SUBCUTANEOUS
  Administered 2020-01-31: 55 [IU] via SUBCUTANEOUS
  Administered 2020-02-01: 35 [IU] via SUBCUTANEOUS
  Administered 2020-02-01: 55 [IU] via SUBCUTANEOUS
  Administered 2020-02-02: 45 [IU] via SUBCUTANEOUS
  Administered 2020-02-02: 65 [IU] via SUBCUTANEOUS
  Administered 2020-02-03: 55 [IU] via SUBCUTANEOUS
  Administered 2020-02-03: 45 [IU] via SUBCUTANEOUS
  Administered 2020-02-04: 55 [IU] via SUBCUTANEOUS
  Filled 2020-01-29: qty 3

## 2020-01-29 MED ORDER — GUAIFENESIN-DM 100-10 MG/5ML PO SYRP
5.0000 mL | ORAL_SOLUTION | ORAL | Status: DC | PRN
  Administered 2020-01-29: 5 mL via ORAL
  Filled 2020-01-29: qty 10

## 2020-01-29 MED ORDER — INSULIN REGULAR HUMAN (CONC) 500 UNIT/ML ~~LOC~~ SOPN
10.0000 [IU] | PEN_INJECTOR | Freq: Every day | SUBCUTANEOUS | Status: DC
  Administered 2020-01-29: 35 [IU] via SUBCUTANEOUS
  Filled 2020-01-29: qty 3

## 2020-01-29 MED ORDER — VANCOMYCIN HCL IN DEXTROSE 1-5 GM/200ML-% IV SOLN
1000.0000 mg | Freq: Two times a day (BID) | INTRAVENOUS | Status: DC
  Administered 2020-01-29 – 2020-02-01 (×7): 1000 mg via INTRAVENOUS
  Filled 2020-01-29 (×8): qty 200

## 2020-01-29 NOTE — Progress Notes (Signed)
Coughing episode caused her to have respiratory distress- needing a breathing tx

## 2020-01-29 NOTE — Evaluation (Signed)
Occupational Therapy Evaluation Patient Details Name: [name] MRN: [id number] DOB: 1961-07-14 Today's Date: 01/29/2020    History of Present Illness Pt is a 59 yo female presenting s/p amputation of right 4th and 5th ray on 5/19 due to RLE abcess and osteomyelitis. PMH includes: asthma, CHF, chronic pain, CAD, gout, HLD, HTN, Mixed Connective Tissue Disease, obesity, and DM II.   Clinical Impression   Patient with functional deficits listed below impacting safety and independence with self care. Patient require min cues for maintaining NWB of R LE during stand pivot with rolling walker to bedside commode. Patient heavily reliant on B UE support due to NWB and was total A for peri care in standing. Recommend additional rehab prior to returning home to work on ADL + transfer training while adhering to precautions.    Follow Up Recommendations  SNF    Equipment Recommendations  Other (comment)(TBD)       Precautions / Restrictions Precautions Precautions: Fall Precaution Comments: wound vac RLE Restrictions Weight Bearing Restrictions: Yes RLE Weight Bearing: Non weight bearing      Mobility Bed Mobility Overal bed mobility: Needs Assistance Bed Mobility: Sidelying to Sit   Sidelying to sit: Supervision       General bed mobility comments: supervision to EOB withuse of assist, but significant use of elevated HOB and bed rails  Transfers Overall transfer level: Needs assistance Equipment used: Rolling walker (2 wheeled) Transfers: Sit to/from [employer] Sit to Stand: +2 physical assistance;+2 safety/equipment;Min assist Stand pivot transfers: Mod assist;+2 physical assistance;+2 safety/equipment       General transfer comment: decreased carry over of cues for body mechanics to push from bed vs pulling on walker, min cues for maintaining NWB    Balance Overall balance assessment: Needs assistance Sitting-balance support: Feet supported Sitting  balance-[name]: Good     Standing balance support: Bilateral upper extremity supported;During functional activity Standing balance-[name]: Poor Standing balance comment: BUE support and mod +2 external support                           ADL either performed or assessed with clinical judgement   ADL Overall ADL's : Needs assistance/impaired     Grooming: Set up;Sitting   Upper Body Bathing: Set up;Sitting   Lower Body Bathing: Moderate assistance;Maximal assistance;Sitting/lateral leans;Sit to/from stand   Upper Body Dressing : Set up;Sitting   Lower Body Dressing: Moderate assistance;Maximal assistance;Sit to/from stand;Sitting/lateral leans   Toilet Transfer: +2 for physical assistance;+2 for safety/equipment;Minimal assistance;Stand-pivot;RW;BSC Toilet Transfer Details (indicate cue type and reason): verbal cues x1 for maintaining NWB status with pivot to bedside commode.  Toileting- Clothing Manipulation and Hygiene: Total assistance;Sit to/from stand       Functional mobility during ADLs: Minimal assistance;+2 for physical assistance;+2 for safety/equipment;Rolling walker;Cueing for safety                    Pertinent Vitals/Pain Pain Assessment: Faces Pain Score: 7  Pain Location: back Pain Descriptors / Indicators: Sore Pain Intervention(s): Monitored during session;Patient requesting pain meds-RN notified     Hand Dominance Right   Extremity/Trunk Assessment Upper Extremity Assessment Upper Extremity Assessment: Generalized weakness   Lower Extremity Assessment Lower Extremity Assessment: Defer to PT evaluation    Cervical / Trunk Assessment Cervical / Trunk Assessment: Kyphotic   Communication Communication Communication: No difficulties   Cognition Arousal/Alertness: Awake/alert Behavior During Therapy: WFL for tasks assessed/performed;Anxious Overall Cognitive Status: Within  Functional Limits for tasks assessed                                  General Comments: Pt with slight anxieties about not being able to move as well as anticipated,    General Comments  pt HR to 120s with stand activity, concerned about going to rehab but more agreeable after session            Home Living Family/patient expects to be discharged to:: Private residence Living Arrangements: Spouse/significant other Available Help at Discharge: Family;Available PRN/intermittently Type of Home: House Home Access: Stairs to enter Entrance Stairs-Number of Steps: 3 STE from front, 10 STE at back. Entrance Stairs-Rails: None(at front entrance) Home Layout: Able to live on main level with bedroom/bathroom;Two level Alternate Level Stairs-Number of Steps: flight, sewing room upstairs   Bathroom Shower/Tub: Tub/shower unit   Bathroom Toilet: Handicapped height     Home Equipment: Environmental consultant - 4 wheels;Walker - standard;Shower seat;Grab bars - tub/shower;Bedside commode          Prior Functioning/Environment Level of Independence: Independent with assistive device(s)        Comments: pt reports using rollator for ambulation outside the house. Does some furniture walking inside the house        OT Problem List: Decreased strength;Decreased activity tolerance;Impaired balance (sitting and/or standing);Decreased safety awareness;Decreased knowledge of use of DME or AE;Decreased knowledge of precautions;Obesity;Pain      OT Treatment/Interventions: [name]/ADL training;Therapeutic exercise;DME and/or AE instruction;Therapeutic activities;Patient/family education;Balance training    OT Goals(Current goals can be found in the care plan section) Acute Rehab OT Goals Patient Stated Goal: return home without need for WC OT Goal Formulation: With patient Time For Goal Achievement: 02/12/20 Potential to Achieve Goals: Good  OT Frequency: Min 2X/week   Barriers to D/C: Inaccessible home environment;Decreased caregiver  support  patient has 3 STE without rails and spouse is in/out of house for work       Co-evaluation PT/OT/SLP Co-Evaluation/Treatment: Yes Reason for Co-Treatment: For Academic librarian;To address functional/ADL transfers PT goals addressed during session: Mobility/safety with mobility;Balance;Proper use of DME OT goals addressed during session: ADL's and self-care      AM-PAC OT "6 Clicks" Daily Activity     Outcome Measure Help from another person eating meals?: None Help from another person taking care of personal grooming?: A Little Help from another person toileting, which includes using toliet, bedpan, or urinal?: A Lot Help from another person bathing (including washing, rinsing, drying)?: A Lot Help from another person to put on and taking off regular upper body clothing?: A Little Help from another person to put on and taking off regular lower body clothing?: A Lot 6 Click Score: 16   End of Session Equipment Utilized During Treatment: Rolling walker;Gait belt Nurse Communication: Mobility status;Patient requests pain meds;Weight bearing status  Activity Tolerance: Patient tolerated treatment well Patient left: in bed;with call bell/phone within reach;with nursing/sitter in room;with bed alarm set(seated EOB)  OT Visit Diagnosis: Unsteadiness on feet (R26.81);Other abnormalities of gait and mobility (R26.89);Pain Pain - Right/Left: Right Pain - part of body: Ankle and joints of foot(back)                Time: 0935-1006 OT Time Calculation (min): 31 min Charges:  OT General Charges $OT Visit: 1 Visit OT Evaluation $OT Eval Moderate Complexity: 1 Mod  Marlyce Huge OT OT office: [phone]  Carmelia Roller  01/29/2020, 2:09 PM

## 2020-01-29 NOTE — Progress Notes (Signed)
   01/29/20 1006  Assess: MEWS Score  Temp 99.7 F (37.6 C)  BP 114/89  Pulse Rate (!) 104  Resp 18  Level of Consciousness Alert  SpO2 93 %  O2 Device Nasal Cannula  Assess: MEWS Score  MEWS Temp 0  MEWS Systolic 0  MEWS Pulse 1  MEWS RR 1  MEWS LOC 0  MEWS Score 2  MEWS Score Color Yellow  Assess: if the MEWS score is Yellow or Red  Were vital signs taken at a resting state? Yes  Focused Assessment Documented focused assessment  Early Detection of Sepsis Score *See Row Information* Low  MEWS guidelines implemented *See Row Information* Yes  Treat  MEWS Interventions Administered scheduled meds/treatments;Administered prn meds/treatments;Consulted Respiratory Therapy  Take Vital Signs  Increase Vital Sign Frequency  Yellow: Q 2hr X 2 then Q 4hr X 2, if remains yellow, continue Q 4hrs  Escalate  MEWS: Escalate Yellow: discuss with charge nurse/RN and consider discussing with provider and RRT  Notify: Charge Nurse/RN  Name of Charge Nurse/RN Notified  (ROJ)  Date Charge Nurse/RN Notified 01/29/20  Time Charge Nurse/RN Notified 1011  Notify: Provider  Provider Name/Title  [place] Person)  Date Provider Notified 01/29/20  Time Provider Notified 1141  Notification Type Call  Notification Reason Other (Comment) (yellow MEWS)  Response No new orders  Date of Provider Response 01/29/20  Time of Provider Response 1235  Document  Patient Outcome Stabilized after interventions  Progress note created (see row info) Yes

## 2020-01-29 NOTE — Progress Notes (Signed)
Patient is postop day 1 status post ray amputation of the right foot.  She is comfortable lying in bed today.  She is anxious to work with physical therapy.  Vital signs stable her blood sugars are elevated at 207 this morning.  VAC is working and in place.  No drainage in the canister.  Cultures have shown polymicrobial growth.  Status post above.  I explained to the patient that we need to finalize the cultures so she is on the appropriate antibiotic.  She will continue with Zosyn and vancomycin for now.  She will work with physical therapy.  She did question something about going to rehab we will see how she does in PT.  With regards to her blood sugar she is on a concentrated Humulin insulin.  I have asked the diabetes coordinator nurse to help with management of this.  We will await their recommendations.  Also patient knows that she is not eating properly as this contributes to her struggles.  Reassurance was given that she can change this and that if she decreases her BMI she also will decrease hopefully the need for the insulin she is currently on

## 2020-01-29 NOTE — Evaluation (Signed)
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 PMH includes: asthma, CHF, chronic pain, CAD, gout, HLD, HTN, Mixed Connective Tissue Disease, obesity, and DM II.  Clinical Impression  Pt in bed upon arrival of PT, agreeable to evaluation at this time. Prior to admission the pt was independent with use of rollator when ambulating outside of the house, but reports reduced activity recently. The pt lives at home with her husband in home with 3 steps to enter without rails. The pt now presents with limitations in functional mobility, power, strength, and activity tolerance due to above dx and reduced mobility, and will continue to benefit from skilled PT to address these deficits. The pt was able to complete multiple sit-stands through the session, but requires assist of 2 to complete safely, and modA of 2 with RW to complete pivots. The pt is currently unable to complete more than a pivot while maintaining her NWB RLE precautions, and therefore will benefit from short term rehab prior to return home to facilitate improved independence with mobility and transfers.      Follow Up Recommendations SNF;Supervision/Assistance - 24 hour(short term SNF)    Equipment Recommendations  (defer to post acute. if going home likely needs WC)    Recommendations for Other Services       Precautions / Restrictions Precautions Precautions: Fall Precaution Comments: wound vac RLE Restrictions Weight Bearing Restrictions: Yes RLE Weight Bearing: Non weight bearing      Mobility  Bed Mobility Overal bed mobility: Needs Assistance Bed Mobility: Sidelying to Sit   Sidelying to sit: Supervision       General bed mobility comments: supervision to EOB withuse of assist, but significant use  of elevated HOB and bed rails  Transfers Overall transfer level: Needs assistance Equipment used: Rolling walker (2 wheeled) Transfers: Sit to/from [employer] Sit to Stand: +2 physical assistance;+2 safety/equipment;Min assist Stand pivot transfers: Mod assist;+2 physical assistance;+2 safety/equipment       General transfer comment: pt benefits from minA of 2 to rise with RW from elevated bed. Pt unable to push from bed surface as instructed, continued to pull on RW to achieve stand while maintaining NWB RLE. able to complete x 3 through session. Pt also completed stand-pivot with heavy modA due to pt poor mobility on LLE without attempting to use RLE. Pt tends to sit prior to completing pivot. thorough education on safety, needs reinforcement  Ambulation/Gait Ambulation/Gait assistance: (unsafe to attempt today)              Stairs            Wheelchair Mobility    Modified Rankin (Stroke Patients Only)       Balance Overall balance assessment: Needs assistance Sitting-balance support: Feet supported Sitting balance-[name]: Good     Standing balance support: Bilateral upper extremity supported;During functional activity Standing balance-[name]: Poor Standing balance comment: BUE support and mod +2 external support                             Pertinent Vitals/Pain Pain Assessment: 0-10 Pain Score: 7  Pain Location: back Pain Descriptors / Indicators: Sore Pain Intervention(s): Limited activity within patient's tolerance;Monitored during session;Repositioned    Home  Living Family/patient expects to be discharged to:: Private residence Living Arrangements: Spouse/significant other Available Help at Discharge: Family;Available PRN/intermittently Type of Home: House Home Access: Stairs to enter Entrance Stairs-Rails: None Entrance Stairs-Number of Steps: 3 STE from front, 10 STE at back. Home Layout: Able to live on  main level with bedroom/bathroom;Two level Home Equipment: Walker - 4 wheels;Walker - standard;Shower seat;Grab bars - tub/shower;Bedside commode      Prior Function Level of Independence: Independent with assistive device(s)         Comments: pt reports using rollator for ambulation outside the house. Does some furniture walking inside the house     Hand Dominance   Dominant Hand: Right    Extremity/Trunk Assessment   Upper Extremity Assessment Upper Extremity Assessment: Overall WFL for tasks assessed    Lower Extremity Assessment Lower Extremity Assessment: Generalized weakness;RLE deficits/detail RLE Deficits / Details: NWB RLE: Unable to fully assess due to immobilization    Cervical / Trunk Assessment Cervical / Trunk Assessment: Kyphotic  Communication   Communication: No difficulties  Cognition Arousal/Alertness: Awake/alert Behavior During Therapy: WFL for tasks assessed/performed;Anxious Overall Cognitive Status: Within Functional Limits for tasks assessed                                 General Comments: Pt with slight anxieties about not being able to move as well as anticipated, demos slightly impaired insight into significance of deficits and safety awareness, originally opposed to rehab but possibly agreeable after todays session.      General Comments General comments (skin integrity, edema, etc.): pt HR to 120s with stand activity, concerned about going to rehab but more agreeable after session    Exercises     Assessment/Plan    PT Assessment Patient needs continued PT services  PT Problem List Decreased strength;Decreased mobility;Decreased activity tolerance;Decreased balance;Decreased knowledge of use of DME;Pain;Decreased coordination;Decreased safety awareness       PT Treatment Interventions DME instruction;Therapeutic exercise;Gait training;Stair training;Functional mobility training;Therapeutic activities;Patient/family  education;Balance training    PT Goals (Current goals can be found in the Care Plan section)  Acute Rehab PT Goals Patient Stated Goal: return home without need for [facility] PT Goal Formulation: With patient Time For Goal Achievement: 02/12/20 Potential to Achieve Goals: Fair    Frequency Min 3X/week   Barriers to discharge Inaccessible home environment pt with 3 STE without rails or 10 STE at back of house    Co-evaluation PT/OT/SLP Co-Evaluation/Treatment: Yes Reason for Co-Treatment: For patient/therapist safety;To address functional/ADL transfers PT goals addressed during session: Mobility/safety with mobility;Balance;Proper use of DME         AM-PAC PT "6 Clicks" Mobility  Outcome Measure Help needed turning from your back to your side while in a flat bed without using bedrails?: A Little Help needed moving from lying on your back to sitting on the side of a flat bed without using bedrails?: A Lot Help needed moving to and from a bed to a chair (including a wheelchair)?: A Lot Help needed standing up from a chair using your arms (e.g., wheelchair or bedside chair)?: A Lot Help needed to walk in hospital room?: Total Help needed climbing 3-5 steps with a railing? : Total 6 Click Score: 11    End of Session Equipment Utilized During Treatment: Gait belt Activity Tolerance: Patient tolerated treatment well;Patient limited by fatigue Patient left: in bed;with nursing/sitter in room(with RN and respiratory therapist present) Nurse  Communication: Mobility status PT Visit Diagnosis: Difficulty in walking, not elsewhere classified (R26.2);Muscle weakness (generalized) (M62.81);Pain Pain - Right/Left: Right Pain - part of body: Ankle and joints of foot    Time: 0935-1008 PT Time Calculation (min) (ACUTE ONLY): 33 min   Charges:   PT Evaluation $PT Eval Moderate Complexity: 1 Mod          Rolm Baptise, PT, DPT   Acute Rehabilitation Department Pager #: [phone]  Gaetana Michaelis 01/29/2020, 11:30 AM

## 2020-01-29 NOTE — Progress Notes (Signed)
Pharmacy Antibiotic Note  [name] is a 59 y.o. female admitted on 01/28/2020 with RLE osteomyelitis, cellulitis  Pharmacy has been consulted for vancomycin and pip/tazo dosing. S/p amputation 5/19. No labs done. Pt is afebrile.  Plan: Change vancomycin to 1gm IV Q12H Continue zosyn 3.375gm IV Q8H (4 hr inf) F/u renal fxn, C&S, clinical status and trough at [facility] BMET now  Height: 5\' 2"  (157.5 cm) Weight: 131.5 kg (290 lb) IBW/kg (Calculated) : 50.1  Temp (24hrs), Avg:98.8 F (37.1 C), Min:98.1 F (36.7 C), Max:99.6 F (37.6 C)  No results for input(s): WBC, CREATININE, LATICACIDVEN, VANCOTROUGH, VANCOPEAK, VANCORANDOM, GENTTROUGH, GENTPEAK, GENTRANDOM, TOBRATROUGH, TOBRAPEAK, TOBRARND, AMIKACINPEAK, AMIKACINTROU, AMIKACIN in the last 168 hours.  Estimated Creatinine Clearance: 80.9 mL/min (by C-G formula based on SCr of 0.99 mg/dL).    Antimicrobials this admission: Vanc  5/19 >>  Pip/tazo 5/19 >>   Microbiology results: 5/19 Tissue - pending    Thank you for allowing pharmacy to be a part of this patient's care.  6/19, PharmD, BCPS Clinical Pharmacist Please see AMION for all pharmacy numbers 01/29/2020 9:43 AM

## 2020-01-29 NOTE — Progress Notes (Signed)
Inpatient Diabetes Program Recommendations  AACE/ADA: New Consensus Statement on Inpatient Glycemic Control (2015)  Target Ranges:  Prepandial:   less than 140 mg/dL      Peak postprandial:   less than 180 mg/dL (1-2 hours)      Critically ill patients:  140 - 180 mg/dL   Lab Results  Component Value Date   GLUCAP 89 01/29/2020   HGBA1C 9.0 (H) 01/28/2020    Review of Glycemic Control  Diabetes history: DM 2 PCP [name] managing her DM. been on U-500 for 12 years. Outpatient Diabetes medications: Humulin R U-500 40-150 tid, metformin 1000 mg bid Current orders for Inpatient glycemic control:  Novolog 0-15 units tid  Metformin 1000 mg bid  Inpatient Diabetes Program Recommendations:    Spoke with pt at bedside regarding U-500 insulin at home.  Pt reports using her own U-500 insulin 80 units this am resulting in glucose of 64.   Recommend using her U-500 scale from home on lower doses.  Timing for scale to be 0900, 1700, and 2200.   For the 0900 and 1700 dose use the following scale:  <100-125- 15 units 125-150-25 units 151-200- 35 units 201-249- 45 units 250-300- 55 units 301-349- 65 units 350-400- 75 units 401-449- 85 units 450-500-95 units  For bedtime use the following scale  <100-150- 10 units 151-200- 25 units 201-300 35 units 301-400- 45 units 401-500 55 units  Pt wanted me to report her having a cough with her asthma and is requesting 1200 mg bid of guaifenesin. She also said she has found adding phenergan to her breathig treatments helps with her cough as well????? Relayed information to Chales Abrahams Persons, PA.  May get pharmacy to assist in ordering medication.  Thanks,  Christena Deem RN, MSN, BC-ADM Inpatient Diabetes Coordinator Team Pager [phone] (8a-5p)

## 2020-01-30 LAB — GLUCOSE, CAPILLARY
Glucose-Capillary: 273 mg/dL — ABNORMAL HIGH (ref 70–99)
Glucose-Capillary: 289 mg/dL — ABNORMAL HIGH (ref 70–99)
Glucose-Capillary: 227 mg/dL — ABNORMAL HIGH (ref 70–99)
Glucose-Capillary: 178 mg/dL — ABNORMAL HIGH (ref 70–99)

## 2020-01-30 LAB — BASIC METABOLIC PANEL
Anion gap: 10 (ref 5–15)
BUN: 9 mg/dL (ref 6–20)
CO2: 27 mmol/L (ref 22–32)
Calcium: 8.4 mg/dL — ABNORMAL LOW (ref 8.9–10.3)
Chloride: 98 mmol/L (ref 98–111)
Creatinine, Ser: 1.21 mg/dL — ABNORMAL HIGH (ref 0.44–1.00)
GFR calc Af Amer: 57 mL/min — ABNORMAL LOW (ref >60)
GFR calc non Af Amer: 49 mL/min — ABNORMAL LOW (ref >60)
Glucose, Bld: 272 mg/dL — ABNORMAL HIGH (ref 70–99)
Potassium: 4.4 mmol/L (ref 3.5–5.1)
Sodium: 135 mmol/L (ref 135–145)

## 2020-01-30 MED ORDER — INSULIN REGULAR HUMAN (CONC) 500 UNIT/ML ~~LOC~~ SOPN
10.0000 [IU] | PEN_INJECTOR | Freq: Every day | SUBCUTANEOUS | Status: DC
  Administered 2020-01-30 – 2020-02-03 (×5): 35 [IU] via SUBCUTANEOUS

## 2020-01-30 NOTE — Progress Notes (Signed)
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 PMH includes: asthma, CHF, chronic pain, CAD, gout, HLD, HTN, Mixed Connective Tissue Disease, obesity, and DM II.    PT Comments    Pt progressing well.  She remains anxious but as treatment progressed anxiety decreased.  Plan remains for skilled nursing short term rehab to improve strength and function.  Utilized a shorter walker this session to give her leverage during transfers and hop steps.  Pt wants to go home but willing for placement if it is best for her.  She mentioned wanting OPPT if she progressed well enough to d/c home.  If she does d/c home she will need a youth height RW.     Follow Up Recommendations  SNF;Supervision/Assistance - 24 hour     Equipment Recommendations  Rolling walker with 5" wheels(YOUTH HEIGHT)    Recommendations for Other Services       Precautions / Restrictions Precautions Precautions: Fall Precaution Comments: wound vac RLE Restrictions Weight Bearing Restrictions: Yes RLE Weight Bearing: Non weight bearing    Mobility  Bed Mobility               General bed mobility comments: seated on edge of bed on arrival.  Transfers Overall transfer level: Needs assistance Equipment used: Rolling walker (2 wheeled)(youth height) Transfers: Sit to/from [employer] Sit to Stand: Mod assist         General transfer comment: Cues for hand placement pushing one hand from seated surface and bracing other on RW.  Pt required mod assistance to boost into standing, as transfer progressed and she became more confident she required min assistance.  PTA did switch her to youth height RW to better accomodate her height.  Ambulation/Gait Ambulation/Gait assistance: Mod  assist Gait Distance (Feet): 4 Feet(x4 trials.  between seated surfaces.) Assistive device: Rolling walker (2 wheeled)(youth height) Gait Pattern/deviations: Step-to pattern;Antalgic;Trunk flexed     General Gait Details: Pt able to perform hop to pattern with intermittent pivoting from surface to surface.   Stairs             Wheelchair Mobility    Modified Rankin (Stroke Patients Only)       Balance Overall balance assessment: Needs assistance Sitting-balance support: Feet supported Sitting balance-[name]: Good       Standing balance-[name]: Poor                              Cognition Arousal/Alertness: Awake/alert Behavior During Therapy: WFL for tasks assessed/performed;Anxious Overall Cognitive Status: Within Functional Limits for tasks assessed                                        Exercises General Exercises - Lower Extremity Long Arc Quad: AROM;Both;10 reps;Seated    General Comments        Pertinent Vitals/Pain Pain Assessment: Faces Pain Location: back Pain Descriptors / Indicators: Sore Pain Intervention(s): Monitored during session;Repositioned    Home Living                      Prior Function  PT Goals (current goals can now be found in the care plan section) Acute Rehab PT Goals Patient Stated Goal: return home without need for WC Potential to Achieve Goals: Fair Progress towards PT goals: Progressing toward goals    Frequency    Min 3X/week      PT Plan Current plan remains appropriate    Co-evaluation              AM-PAC PT "6 Clicks" Mobility   Outcome Measure  Help needed turning from your back to your side while in a flat bed without using bedrails?: A Little Help needed moving from lying on your back to sitting on the side of a flat bed without using bedrails?: A Lot Help needed moving to and from a bed to a chair (including a wheelchair)?: A  Lot Help needed standing up from a chair using your arms (e.g., wheelchair or bedside chair)?: A Lot Help needed to walk in hospital room?: A Lot Help needed climbing 3-5 steps with a railing? : Total 6 Click Score: 12    End of Session Equipment Utilized During Treatment: Gait belt Activity Tolerance: Patient tolerated treatment well;Patient limited by fatigue Patient left: in bed;with nursing/sitter in room Nurse Communication: Mobility status PT Visit Diagnosis: Difficulty in walking, not elsewhere classified (R26.2);Muscle weakness (generalized) (M62.81);Pain Pain - Right/Left: Right Pain - part of body: Ankle and joints of foot     Time: 9163-8466 PT Time Calculation (min) (ACUTE ONLY): 36 min  Charges:  $Therapeutic Activity: 23-37 mins                     Erasmo Leventhal , PTA Acute Rehabilitation Services Pager [phone] Office [phone]     [name] Eli Hose 01/30/2020, 5:13 PM

## 2020-01-30 NOTE — Progress Notes (Signed)
Patient is postop day 2 status post ray amputation of the right foot she is lying comfortably in bed sleeping but easily arousable.  Pain control is good.  Cultures are growing out 4 different bacteria.  Sensitivities are not yet complete.  Hopefully in the next 24 hours.  Vital signs stable this morning.  Blood sugars are still elevated but being followed by diabetes nurse and appreciate input from them and pharmacy regarding her concentrated insulin dosing reviewed physical therapy occupational therapy recommendations which will be short-term skilled nursing facility.  Discussed this with the patient and will consult transition of care.  Hopefully discharge in the next 2 days or so.  Patient may require a PICC line once sensitivities are finalized

## 2020-01-30 NOTE — Progress Notes (Signed)
Inpatient Diabetes Program Recommendations  AACE/ADA: New Consensus Statement on Inpatient Glycemic Control (2015)  Target Ranges:  Prepandial:   less than 140 mg/dL      Peak postprandial:   less than 180 mg/dL (1-2 hours)      Critically ill patients:  140 - 180 mg/dL   Lab Results  Component Value Date   GLUCAP 289 (H) 01/30/2020   HGBA1C 9.0 (H) 01/28/2020    Review of Glycemic Control Results for [name], [name] (MRN [id number]) as of 01/30/2020 14:21  Ref. Range 01/29/2020 17:07 01/29/2020 20:21 01/30/2020 08:11 01/30/2020 12:09  Glucose-Capillary Latest Ref Range: 70 - 99 mg/dL 841 (H) 324 (H) 401 (H) 289 (H)   Diabetes history: DM 2 PCP [name] managing her DM. been on U-500 for 12 years. Outpatient Diabetes medications: Humulin R U-500 40-150 tid, metformin 1000 mg bid Current orders for Inpatient glycemic control:  Novolog 0-15 units tid  Metformin 1000 mg bid U-500 10-55 units QHS, U-500 15-95 units BID  Inpatient Diabetes Program Recommendations:    Glucose trends increasing with current scales using U-500.   Consider increasing scales back to patient's home dosing per outpatient recommendations per PCP.   Spoke with patient regarding U-500 scales. Patient reports eating low carb lunch (tickets read 18 grams) and is in agreement that she needs more insulin. Denies having problems with hypoglycemia and has hypoglycemia awareness at 60 mg/dL. Also, reviewed Freestyle Libre risks and benefits, provided information and how to apply. Patient plans to discuss with PCP.  Reviewed patient's current A1c of 9.0%. Explained what a A1c is and what it measures. Also reviewed goal A1c with patient, importance of good glucose control @ home, and blood sugar goals. Reviewed patho of DM, impact of glucose and steroids with infection and difficulty with glycemic control, vascular changes and other commorbidities.  Patient is savvy with counting carbs and reports mindfulness with carb  intake.   Below are recommendations:  For the 0900 and 1700 dose use the following scale:  <100-      0 units 100-125- 25 units 125-150-35 units 151-200- 45 units 201-249- 55 units 250-300- 65 units 301-349- 75 units 350-400- 85 units 401-449- 95 units 450-500-95 units  For bedtime use the following scale  <100- 0 units 100-150- 20 units 151-200- 35 units 201-300- 45 units 301-400- 55 units 401-500- 65 units  Secure chat sent to Chales Abrahams PA regarding recs.  Thanks,  Lujean Rave, MSN, RNC-OB Diabetes Coordinator [phone] (8a-5p)

## 2020-01-30 NOTE — Plan of Care (Signed)
  Problem: Education: Goal: Knowledge of General Education information will improve Description: Including pain rating scale, medication(s)/side effects and non-pharmacologic comfort measures Outcome: Progressing   Problem: Health Behavior/Discharge Planning: Goal: Ability to manage health-related needs will improve Outcome: Progressing   Problem: Nutrition: Goal: Adequate nutrition will be maintained Outcome: Progressing   Problem: Elimination: Goal: Will not experience complications related to bowel motility Outcome: Progressing   Problem: Safety: Goal: Ability to remain free from injury will improve Outcome: Progressing   Problem: Skin Integrity: Goal: Risk for impaired skin integrity will decrease Outcome: Progressing   

## 2020-01-31 LAB — CBC
HCT: 34.6 % — ABNORMAL LOW (ref 36.0–46.0)
Hemoglobin: 10.1 g/dL — ABNORMAL LOW (ref 12.0–15.0)
MCH: 24.6 pg — ABNORMAL LOW (ref 26.0–34.0)
MCHC: 29.2 g/dL — ABNORMAL LOW (ref 30.0–36.0)
MCV: 84.2 fL (ref 80.0–100.0)
Platelets: 340 10*3/uL (ref 150–400)
RBC: 4.11 MIL/uL (ref 3.87–5.11)
RDW: 17.3 % — ABNORMAL HIGH (ref 11.5–15.5)
WBC: 9.1 10*3/uL (ref 4.0–10.5)
nRBC: 0 % (ref 0.0–0.2)

## 2020-01-31 LAB — GLUCOSE, CAPILLARY
Glucose-Capillary: 140 mg/dL — ABNORMAL HIGH (ref 70–99)
Glucose-Capillary: 231 mg/dL — ABNORMAL HIGH (ref 70–99)
Glucose-Capillary: 258 mg/dL — ABNORMAL HIGH (ref 70–99)
Glucose-Capillary: 172 mg/dL — ABNORMAL HIGH (ref 70–99)

## 2020-01-31 LAB — BASIC METABOLIC PANEL
Anion gap: 12 (ref 5–15)
BUN: 7 mg/dL (ref 6–20)
CO2: 27 mmol/L (ref 22–32)
Calcium: 8.8 mg/dL — ABNORMAL LOW (ref 8.9–10.3)
Chloride: 101 mmol/L (ref 98–111)
Creatinine, Ser: 1.06 mg/dL — ABNORMAL HIGH (ref 0.44–1.00)
GFR calc Af Amer: >60 mL/min (ref >60)
GFR calc non Af Amer: 58 mL/min — ABNORMAL LOW (ref >60)
Glucose, Bld: 78 mg/dL (ref 70–99)
Potassium: 4 mmol/L (ref 3.5–5.1)
Sodium: 140 mmol/L (ref 135–145)

## 2020-01-31 NOTE — Plan of Care (Signed)
  Problem: Education: Goal: Knowledge of General Education information will improve Description: Including pain rating scale, medication(s)/side effects and non-pharmacologic comfort measures Outcome: Progressing   Problem: Pain Managment: Goal: General experience of comfort will improve Outcome: Progressing   Problem: Safety: Goal: Ability to remain free from injury will improve Outcome: Progressing   

## 2020-01-31 NOTE — TOC Initial Note (Signed)
Transition of Care [facility]) - Initial/Assessment Note    Patient Details  Name: [name] MRN: [id number] Date of Birth: [birth year]  Transition [facility]) CM/SW Contact:    Patrice Paradise, LCSW Phone Number: [phone]  01/31/2020, 12:46 PM  Clinical Narrative:                   CSW soke with patient about the SNF recommendation. Initially,  patient agreed to SNF however she is also considering going home with [facility]. She stated that she would like to speak with her husband and let CSW know her decision tomorrow. CSW provided patient with the medicare.gov rating list.  TOC team will continue to follow for discharge planning needs.  Expected Discharge Plan: Skilled Nursing Facility Barriers to Discharge: Continued Medical Work up, SNF Pending bed offer   Patient Goals and CMS Choice   CMS Medicare.gov Compare Post Acute Care list provided to:: Patient Choice offered to / list presented to : Patient  Expected Discharge Plan and Services Expected Discharge Plan: Skilled Nursing Facility       Living arrangements for the past 2 months: Single Family Home                                      Prior Living Arrangements/Services Living arrangements for the past 2 months: Single Family Home Lives with:: Self, Spouse Patient language and need for interpreter reviewed:: Yes          Care giver support system in place?: Yes (comment)      Activities of Daily Living Home Assistive Devices/Equipment: Eyeglasses, Dentures (specify type), Blood pressure cuff, CBG Meter, Nebulizer, Other (Comment)(upper partial; pulse ox) ADL Screening (condition at time of admission) Patient's cognitive ability adequate to safely complete daily activities?: Yes Is the patient deaf or have difficulty hearing?: No Does the patient have difficulty seeing, even when wearing glasses/contacts?: No Does the patient have difficulty concentrating, remembering, or making decisions?: No Patient  able to express need for assistance with ADLs?: Yes Does the patient have difficulty dressing or bathing?: No Independently performs ADLs?: Yes (appropriate for developmental age) Does the patient have difficulty walking or climbing stairs?: Yes Weakness of Legs: Right Weakness of Arms/Hands: None  Permission Sought/Granted      Share Information with NAME: [name]  Permission granted to share info w AGENCY: SNfs  Permission granted to share info w Relationship: spouse  Permission granted to share info w Contact Information: [phone]  Emotional Assessment Appearance:: Appears stated age Attitude/Demeanor/Rapport: Engaged Affect (typically observed): Adaptable, Accepting Orientation: : Oriented to Place, Oriented to Self, Oriented to  Time, Oriented to Situation      Admission diagnosis:  Acute hematogenous osteomyelitis of right foot (HCC) [M86.071] Patient Active Problem List   Diagnosis Date Noted  . Acute hematogenous osteomyelitis of right foot (HCC) 01/28/2020  . Cutaneous abscess of right foot   . Subacute osteomyelitis, right ankle and foot (HCC)   . Statin intolerance 08/16/2019  . PAF (paroxysmal atrial fibrillation) (HCC) 01/06/2019  . Gram-negative bacteremia 10/18/2018  . Streptococcal bacteremia 10/18/2018  . Ulcer of lower extremity, limited to breakdown of skin (HCC) 10/03/2018  . CAD S/P percutaneous coronary angioplasty 04/19/2018  . Essential hypertension   . Moderate persistent asthma 03/21/2018  . NAFLD (nonalcoholic fatty liver disease)   . Recurrent cellulitis of lower extremity 01/02/2018  . Cellulitis of  lower leg 01/02/2018  . Chronic diarrhea 05/08/2017  . H/O Clostridium difficile infection 05/08/2017  . Rectal bleeding 05/08/2017  . Hyperbilirubinemia 04/29/2016  . Hyponatremia 04/29/2016  . Cellulitis of leg, right 03/09/2016  . Mild persistent asthma 02/15/2016  . Allergic rhinitis due to pollen 02/15/2016  . Anaphylactic reaction due to  food 02/10/2016  . Coronary artery disease involving native coronary artery of native heart without angina pectoris 02/10/2016  . Gastroesophageal reflux disease without esophagitis 02/10/2016  . Atopic eczema 02/10/2016  . Cervical nerve root disorder 12/15/2015  . Lumbar radiculopathy 12/15/2015  . Daytime somnolence 11/26/2015  . Venous stasis dermatitis of both lower extremities 02/11/2015  . Morbid obesity ([place]) 06/19/2014  . B-complex deficiency 03/26/2014  . Benign essential HTN 03/26/2014  . Chronic pain associated with significant psychosocial dysfunction 03/26/2014  . Diaphragmatic hernia 03/26/2014  . Gastroesophageal reflux disease 03/26/2014  . Hammer toe 03/26/2014  . H/O neoplasm 03/26/2014  . 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 Diabetic polyneuropathy ([place]) 03/26/2014  . Deafness, sensorineural 03/26/2014  . Fibromyalgia 01/20/2014  . 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 Insulin dependent diabetes mellitus ([place]) 11/11/2012  . Hyperlipidemia LDL goal <70 11/11/2012  . Mixed connective tissue disease ([place]) 11/11/2012  . Hypothyroidism 11/11/2012  . Uncomplicated asthma 98/33/8250  . 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[place]) 02/20/2012  . 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 ANA positive 07/17/2011   PCP:  Shelda Pal, DO Pharmacy:   [place], [place] - [address] [place] [address] Phone: [phone] Fax: [phone]     Social Determinants of Health (SDOH) Interventions    Readmission Risk Interventions No flowsheet data found.

## 2020-01-31 NOTE — Progress Notes (Signed)
Physical Therapy Treatment Patient Details Name: ELODIE PANAMENO MRN: [id number] DOB: 1961-06-05 Today's Date: 01/31/2020    History of Present Illness Pt is a 59 yo female presenting s/p amputation of right 4th and 5th ray on 5/19 due to RLE abcess and osteomyelitis. PMH includes: asthma, CHF, chronic pain, CAD, gout, HLD, HTN, Mixed Connective Tissue Disease, obesity, and DM II.    PT Comments    Pt is making slow steady progress towards goals. She continues to require mod A for most OOB mobilities. Spoke with pt about d/c plans and advised that SNF was still the most appropriate placement. Pt would like to speak with SW before making a final decision. She reports her husband is planning on building a ramp for her. Will continue to follow acutely to maximize functional independence and safety with mobility.     Follow Up Recommendations  SNF;Supervision/Assistance - 24 hour     Equipment Recommendations  Rolling walker with 5" wheels(YOUTH HEIGHT)    Recommendations for Other Services       Precautions / Restrictions Precautions Precautions: Fall Precaution Comments: wound vac RLE Restrictions Weight Bearing Restrictions: Yes RLE Weight Bearing: Non weight bearing    Mobility  Bed Mobility               General bed mobility comments: up in chair on arrival  Transfers Overall transfer level: Needs assistance Equipment used: Rolling walker (2 wheeled)(youth height) Transfers: Sit to/from Stand Sit to Stand: Mod assist         General transfer comment: Cues for safe hand placement. Pt required light mod A to power up and stabilize.  Ambulation/Gait Ambulation/Gait assistance: Mod assist Gait Distance (Feet): 4 Feet(2 trials between seated surfaces) Assistive device: Rolling walker (2 wheeled)(youth height) Gait Pattern/deviations: Step-to pattern;Antalgic;Trunk flexed     General Gait Details: Pt able to perform hop to pattern with intermittent pivoting from  surface to surface.   Stairs             Wheelchair Mobility    Modified Rankin (Stroke Patients Only)       Balance Overall balance assessment: Needs assistance Sitting-balance support: Feet supported Sitting balance-[name]: Good     Standing balance support: Bilateral upper extremity supported;During functional activity Standing balance-[name]: Poor Standing balance comment: heavy reliance on UE support                            Cognition Arousal/Alertness: Awake/alert Behavior During Therapy: WFL for tasks assessed/performed;Anxious Overall Cognitive Status: Within Functional Limits for tasks assessed                                        Exercises General Exercises - Upper Extremity Shoulder Flexion: AROM;Both;10 reps;Seated General Exercises - Lower Extremity Long Arc Quad: AROM;Both;10 reps;Seated Heel Slides: AROM;Both;10 reps;Seated Other Exercises Other Exercises: Scapular retraction; both; 5 reps; Seated    General Comments General comments (skin integrity, edema, etc.): Assisted to [facility] and assist provided for peri care      Pertinent Vitals/Pain Pain Assessment: Faces Faces Pain Scale: Hurts little more Pain Location: back Pain Descriptors / Indicators: Sore Pain Intervention(s): Monitored during session;Limited activity within patient's tolerance;Repositioned    Home Living                      Prior  Function            PT Goals (current goals can now be found in the care plan section) Acute Rehab PT Goals Patient Stated Goal: return home without need for [facility] PT Goal Formulation: With patient Time For Goal Achievement: 02/12/20 Potential to Achieve Goals: Fair Progress towards PT goals: Progressing toward goals    Frequency    Min 3X/week      PT Plan Current plan remains appropriate    Co-evaluation              AM-PAC PT "6 Clicks" Mobility   Outcome Measure  Help  needed turning from your back to your side while in a flat bed without using bedrails?: A Little Help needed moving from lying on your back to sitting on the side of a flat bed without using bedrails?: A Lot Help needed moving to and from a bed to a chair (including a wheelchair)?: A Lot Help needed standing up from a chair using your arms (e.g., wheelchair or bedside chair)?: A Lot Help needed to walk in hospital room?: A Lot Help needed climbing 3-5 steps with a railing? : Total 6 Click Score: 12    End of Session   Activity Tolerance: Patient tolerated treatment well Patient left: with nursing/sitter in room;in chair Nurse Communication: Mobility status PT Visit Diagnosis: Difficulty in walking, not elsewhere classified (R26.2);Muscle weakness (generalized) (M62.81);Pain Pain - Right/Left: Right Pain - part of body: Ankle and joints of foot     Time: 1132-1202 PT Time Calculation (min) (ACUTE ONLY): 30 min  Charges:  $Gait Training: 8-22 mins $Therapeutic Activity: 8-22 mins                     Benjiman Core, [place] Pager [phone] Acute [place] 01/31/2020, 12:15 PM

## 2020-01-31 NOTE — Progress Notes (Signed)
Patient ID: [name], female   DOB: 1961-06-17, 59 y.o.   MRN: [id number] Patient is status post irrigation and debridement abscess right foot.  There is no drainage in the wound VAC canister culture sensitivities are still pending for multiple organisms.  Anticipate discharge to home on Monday on oral antibiotics.  Patient is making progress with her therapy.

## 2020-02-01 LAB — GLUCOSE, CAPILLARY
Glucose-Capillary: 147 mg/dL — ABNORMAL HIGH (ref 70–99)
Glucose-Capillary: 172 mg/dL — ABNORMAL HIGH (ref 70–99)
Glucose-Capillary: 219 mg/dL — ABNORMAL HIGH (ref 70–99)
Glucose-Capillary: 178 mg/dL — ABNORMAL HIGH (ref 70–99)

## 2020-02-01 LAB — BASIC METABOLIC PANEL
Anion gap: 8 (ref 5–15)
BUN: 10 mg/dL (ref 6–20)
CO2: 28 mmol/L (ref 22–32)
Calcium: 8.4 mg/dL — ABNORMAL LOW (ref 8.9–10.3)
Chloride: 99 mmol/L (ref 98–111)
Creatinine, Ser: 1.94 mg/dL — ABNORMAL HIGH (ref 0.44–1.00)
GFR calc Af Amer: 32 mL/min — ABNORMAL LOW (ref >60)
GFR calc non Af Amer: 28 mL/min — ABNORMAL LOW (ref >60)
Glucose, Bld: 87 mg/dL (ref 70–99)
Potassium: 3.8 mmol/L (ref 3.5–5.1)
Sodium: 135 mmol/L (ref 135–145)

## 2020-02-01 MED ORDER — AMOXICILLIN-POT CLAVULANATE 875-125 MG PO TABS
1.0000 | ORAL_TABLET | Freq: Two times a day (BID) | ORAL | Status: DC
  Administered 2020-02-01 – 2020-02-02 (×4): 1 via ORAL
  Filled 2020-02-01 (×4): qty 1

## 2020-02-01 NOTE — Plan of Care (Signed)
°  Problem: Education: °Goal: Knowledge of General Education information will improve °Description: Including pain rating scale, medication(s)/side effects and non-pharmacologic comfort measures °Outcome: Progressing °  °Problem: Nutrition: °Goal: Adequate nutrition will be maintained °Outcome: Progressing °  °Problem: Pain Managment: °Goal: General experience of comfort will improve °Outcome: Progressing °  °Problem: Safety: °Goal: Ability to remain free from injury will improve °Outcome: Progressing °  °

## 2020-02-01 NOTE — NC FL2 (Signed)
[place] MEDICAID FL2 LEVEL OF CARE SCREENING TOOL     IDENTIFICATION  Patient Name: [name] Birthdate: 1961/09/06 Sex: female Admission Date (Current Location): 01/28/2020  [facility] and [place] Number:  Producer, television/film/video and Address:  [facility], [address], [place], [place] [address]      Provider Number: [id number]  Attending Physician Name and Address:  [name], MD  Relative Name and Phone Number:  [name] [phone]    Current Level of Care: Hospital Recommended Level of Care: Skilled Nursing Facility Prior Approval Number:    Date Approved/Denied:   PASRR Number: [id number]  Discharge Plan: SNF    Current Diagnoses: Patient Active Problem List   Diagnosis Date Noted  . Acute hematogenous osteomyelitis of right foot (HCC) 01/28/2020  . Cutaneous abscess of right foot   . Subacute osteomyelitis, right ankle and foot (HCC)   . Statin intolerance 08/16/2019  . PAF (paroxysmal atrial fibrillation) (HCC) 01/06/2019  . Gram-negative bacteremia 10/18/2018  . Streptococcal bacteremia 10/18/2018  . Ulcer of lower extremity, limited to breakdown of skin (HCC) 10/03/2018  . CAD S/P percutaneous coronary angioplasty 04/19/2018  . Essential hypertension   . Moderate persistent asthma 03/21/2018  . NAFLD (nonalcoholic fatty liver disease)   . Recurrent cellulitis of lower extremity 01/02/2018  . Cellulitis of lower leg 01/02/2018  . Chronic diarrhea 05/08/2017  . H/O Clostridium difficile infection 05/08/2017  . Rectal bleeding 05/08/2017  . Hyperbilirubinemia 04/29/2016  . Hyponatremia 04/29/2016  . Cellulitis of leg, right 03/09/2016  . Mild persistent asthma 02/15/2016  . Allergic rhinitis due to pollen 02/15/2016  . Anaphylactic reaction due to food 02/10/2016  . Coronary artery disease involving native coronary artery of native heart without angina pectoris 02/10/2016  . Gastroesophageal reflux disease without  esophagitis 02/10/2016  . Atopic eczema 02/10/2016  . Cervical nerve root disorder 12/15/2015  . Lumbar radiculopathy 12/15/2015  . Daytime somnolence 11/26/2015  . Venous stasis dermatitis of both lower extremities 02/11/2015  . Morbid obesity (HCC) 06/19/2014  . B-complex deficiency 03/26/2014  . Benign essential HTN 03/26/2014  . Chronic pain associated with significant psychosocial dysfunction 03/26/2014  . Diaphragmatic hernia 03/26/2014  . Gastroesophageal reflux disease 03/26/2014  . Hammer toe 03/26/2014  . H/O neoplasm 03/26/2014  . 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 Diabetic polyneuropathy (HCC) 03/26/2014  . Deafness, sensorineural 03/26/2014  . Fibromyalgia 01/20/2014  . 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 Insulin dependent diabetes mellitus (HCC) 11/11/2012  . Hyperlipidemia LDL goal <70 11/11/2012  . Mixed connective tissue disease (HCC) 11/11/2012  . Hypothyroidism 11/11/2012  . Uncomplicated asthma 11/11/2012  . 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HCC) 02/20/2012  . 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 ANA positive 07/17/2011    Orientation RESPIRATION BLADDER Height & Weight     Self, Time, Situation, Place  O2(2l) Continent Weight: 290 lb (131.5 kg) Height:  5\' 2"  (157.5 cm)  BEHAVIORAL SYMPTOMS/MOOD NEUROLOGICAL BOWEL NUTRITION STATUS      Continent Diet(see discharge summary)  AMBULATORY STATUS COMMUNICATION OF NEEDS Skin   Limited Assist Verbally Surgical wounds(dry, cellulitis)                       Personal Care Assistance Level of Assistance  Bathing, Feeding, Dressing Bathing Assistance: Limited assistance Feeding assistance: Independent Dressing Assistance: Limited assistance     Functional Limitations Info  Sight, Hearing, Speech  Sight Info: Adequate Hearing Info: Adequate Speech Info: Adequate    SPECIAL CARE FACTORS FREQUENCY  PT (By licensed PT), OT  (By licensed OT)     PT Frequency: 5x per week OT Frequency: 5x per week            Contractures Contractures Info: Not present    Additional Factors Info  Code Status, Allergies Code Status Info: Full Allergies Info: Fish Allergy, Fish Oil, Omega-3 Fatty Acids, Crestor Rosuvastatin, Gabapentin, Lovastatin, Metronidazole, Red Yeast Rice Cholestin, Rotigotine, Atrovent Hfa Ipratropium Bromide Hfa, Red Yeast Rice Extract, Victoza Liraglutide, Suprep Na Sulfate-k Sulfate-mg Sulf, Tape           Current Medications (02/01/2020):  This is the current hospital active medication list Current Facility-Administered Medications  Medication Dose Route Frequency Provider Last Rate Last Admin  . 0.9 %  sodium chloride infusion   Intravenous Continuous Persons, [place], [place] 75 mL/hr at 01/28/20 1727 New Bag at 01/28/20 1727  . acetaminophen (TYLENOL) tablet 325-650 mg  325-650 mg Oral Q6H PRN Persons, [place], [place]   325 mg at 02/01/20 0513  . albuterol (PROVENTIL) (2.5 MG/3ML) 0.083% nebulizer solution 2.5 mg  2.5 mg Nebulization Q6H PRN Persons, [place], [place]   2.5 mg at 01/29/20 1926  . amoxicillin-clavulanate (AUGMENTIN) 875-125 MG per tablet 1 tablet  1 tablet Oral Q12H Eldred Manges, MD      . aspirin EC tablet 81 mg  81 mg Oral Daily Persons, [place], [place]   81 mg at 02/01/20 0929  . clopidogrel (PLAVIX) tablet 75 mg  75 mg Oral Daily Persons, [place], [place]   75 mg at 02/01/20 0929  . diltiazem (CARDIZEM CD) 24 hr capsule 300 mg  300 mg Oral Daily Persons, [place], [place]   300 mg at 02/01/20 8546  . docusate sodium (COLACE) capsule 100 mg  100 mg Oral BID Persons, [place], [place]   100 mg at 02/01/20 0929  . enalapril (VASOTEC) tablet 10 mg  10 mg Oral BID Persons, [place], [place]   10 mg at 02/01/20 0846  . furosemide (LASIX) tablet 60 mg  60 mg Oral BID Persons, [place], [place]   60 mg at 02/01/20 0844  . gabapentin (NEURONTIN) capsule 300 mg  300 mg Oral TID Persons, [place], [place]   300 mg at  02/01/20 0929  . guaiFENesin-dextromethorphan (ROBITUSSIN DM) 100-10 MG/5ML syrup 5 mL  5 mL Oral Q4H PRN [name], MD   5 mL at 01/29/20 2221  . HYDROmorphone (DILAUDID) injection 0.5-1 mg  0.5-1 mg Intravenous Q4H PRN Persons, [place], [place]   1 mg at 01/31/20 2040  . hydroxychloroquine (PLAQUENIL) tablet 200 mg  200 mg Oral BID Persons, [place], [place]   200 mg at 02/01/20 0844  . insulin regular human CONCENTRATED (HUMULIN R) 500 UNIT/ML kwikpen 10-65 Units  10-65 Units Subcutaneous QHS Persons, [place], [place]   35 Units at 01/31/20 2138  . insulin regular human CONCENTRATED (HUMULIN R) 500 UNIT/ML kwikpen 15-95 Units  15-95 Units Subcutaneous BID WC Persons, [place], [place]   35 Units at 02/01/20 0930  . isosorbide mononitrate (IMDUR) 24 hr tablet 30 mg  30 mg Oral Daily Persons, [place], [place]   30 mg at 02/01/20 0929  . levothyroxine (SYNTHROID) tablet 200 mcg  200 mcg Oral QAC breakfast Persons, [place], [place]   200 mcg at 02/01/20 0513  . metFORMIN (GLUCOPHAGE-XR) 24 hr tablet 1,000 mg  1,000 mg Oral BID WC Persons, [place]evely [place], [place]   1,000 mg at 02/01/20 0846  . metoCLOPramide (REGLAN) tablet 5-10 mg  5-10 mg Oral Q8H PRN Persons, [place], [place]   10 mg at 02/01/20 1051   Or  . metoCLOPramide (REGLAN) injection 5-10 mg  5-10 mg Intravenous Q8H PRN Persons, [place], [place]      . nitroGLYCERIN (NITROSTAT) SL tablet 0.4 mg  0.4 mg Sublingual Q5 min PRN Persons, [place], PA      . ondansetron [facility]) tablet 4 mg  4 mg Oral Q6H PRN Persons, [place]evely [place], [place]   4 mg at 02/01/20 0843   Or  . ondansetron (ZOFRAN) injection 4 mg  4 mg Intravenous Q6H PRN Persons, [place], PA      . oxyCODONE (Oxy IR/ROXICODONE) immediate release tablet 5-10 mg  5-10 mg Oral Q4H PRN Persons, [place], [place]   10 mg at 02/01/20 0843  . pantoprazole (PROTONIX) EC tablet 40 mg  40 mg Oral Daily Persons, [place]evely [place], [place]   40 mg at 02/01/20 0929  . polyvinyl alcohol (LIQUIFILM TEARS) 1.4 % ophthalmic solution 1 drop  1 drop Both  Eyes PRN Newt Minion, MD      . potassium chloride (KLOR-CON) CR tablet 30 mEq  30 mEq Oral BID Persons, [place]ely [place], [place]   30 mEq at 02/01/20 0843  . pramipexole (MIRAPEX) tablet 0.5 mg  0.5 mg Oral Q4H PRN Persons, [place]evely [place], [place]   0.5 mg at 01/31/20 2040     Discharge Medications: Please see discharge summary for a list of discharge medications.  Relevant Imaging Results:  Relevant Lab Results:   Additional Information ssn# [place], [place]

## 2020-02-01 NOTE — TOC Progression Note (Signed)
Transition of Care [facility]) - Progression Note    Patient Details  Name: [name] MRN: [id number] Date of Birth: 03/09/1961  Transition of Care [facility]) CM/SW Contact  Patrice Paradise, [place] Phone Number: [phone] 02/01/2020, 12:46 PM  Clinical Narrative:     CSW spoke with patient and she is willing to go to  SNF. [facility]'s [facility]. CSW received permission to fax out to local facilities. If in network, no authorization needed.  TOC team will continue to monitor for discharge planning needs.     Expected Discharge Plan: Skilled Nursing Facility Barriers to Discharge: Continued Medical Work up, SNF Pending bed offer  Expected Discharge Plan and Services Expected Discharge Plan: Skilled Nursing Facility       Living arrangements for the past 2 months: Single Family Home                                       Social Determinants of Health (SDOH) Interventions    Readmission Risk Interventions No flowsheet data found.

## 2020-02-01 NOTE — Progress Notes (Signed)
Vac no drainage. Working on mobility with PT. Cultures gram pos cocci and neg Rods , few positive rods. Pruteus Mirabilis, stah aureus , enterococcus faecalis. pruteus is sensative to all Abx.   Other sensitivities pending.

## 2020-02-02 ENCOUNTER — Encounter (HOSPITAL_BASED_OUTPATIENT_CLINIC_OR_DEPARTMENT_OTHER): Payer: 59 | Admitting: Internal Medicine

## 2020-02-02 LAB — BASIC METABOLIC PANEL
Anion gap: 13 (ref 5–15)
BUN: 14 mg/dL (ref 6–20)
CO2: 25 mmol/L (ref 22–32)
Calcium: 8.6 mg/dL — ABNORMAL LOW (ref 8.9–10.3)
Chloride: 99 mmol/L (ref 98–111)
Creatinine, Ser: 2.91 mg/dL — ABNORMAL HIGH (ref 0.44–1.00)
GFR calc Af Amer: 20 mL/min — ABNORMAL LOW (ref >60)
GFR calc non Af Amer: 17 mL/min — ABNORMAL LOW (ref >60)
Glucose, Bld: 91 mg/dL (ref 70–99)
Potassium: 4.5 mmol/L (ref 3.5–5.1)
Sodium: 137 mmol/L (ref 135–145)

## 2020-02-02 LAB — GLUCOSE, CAPILLARY
Glucose-Capillary: 174 mg/dL — ABNORMAL HIGH (ref 70–99)
Glucose-Capillary: 154 mg/dL — ABNORMAL HIGH (ref 70–99)
Glucose-Capillary: 271 mg/dL — ABNORMAL HIGH (ref 70–99)
Glucose-Capillary: 198 mg/dL — ABNORMAL HIGH (ref 70–99)

## 2020-02-02 LAB — SARS CORONAVIRUS 2 (TAT 6-24 HRS): SARS Coronavirus 2: NEGATIVE

## 2020-02-02 NOTE — Progress Notes (Signed)
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 PMH includes: asthma, CHF, chronic pain, CAD, gout, HLD, HTN, Mixed Connective Tissue Disease, obesity, and DM II.    PT Comments    Pt continues to make slow functional gains and presents with continued motivation to improve.  Pt able to advance gt with close chair follow and min assistance.  Hop to pattern clearance is noted to get shorter when fatigued.  Continue to recommend snf placement at d/c.      Follow Up Recommendations  SNF;Supervision/Assistance - 24 hour     Equipment Recommendations  Rolling walker with 5" wheels(youth height)    Recommendations for Other Services       Precautions / Restrictions Precautions Precautions: Fall Precaution Comments: wound vac RLE Restrictions Weight Bearing Restrictions: Yes RLE Weight Bearing: Non weight bearing    Mobility  Bed Mobility               General bed mobility comments: up in chair on arrival  Transfers Overall transfer level: Needs assistance Equipment used: Rolling walker (2 wheeled)(youth height) Transfers: Sit to/from Stand Sit to Stand: Min assist Stand pivot transfers: Min assist;+2 safety/equipment       General transfer comment: minA for powerup into standing, vc for safe hand placement, difficulty noted with transition of R hand from seated surface to RW hand grip.  slight tipping of RW noted.  Ambulation/Gait Ambulation/Gait assistance: Min assist;+2 safety/equipment Gait Distance (Feet): 8 Feet Assistive device: Rolling walker (2 wheeled)(youth height) Gait Pattern/deviations: Step-to pattern;Antalgic;Trunk flexed(hop to)     General Gait Details: Pt able to perform hop to pattern with close chair follow.  Fatigue noted with  hop steps due to poor endurance and strength.   Stairs             Wheelchair Mobility    Modified Rankin (Stroke Patients Only)       Balance Overall balance assessment: Needs assistance Sitting-balance support: Feet supported Sitting balance-[name]: Good       Standing balance-[name]: Poor Standing balance comment: heavy reliance on UE support                            Cognition Arousal/Alertness: Awake/alert Behavior During Therapy: WFL for tasks assessed/performed;Anxious Overall Cognitive Status: Within Functional Limits for tasks assessed                                 General Comments: pt appeared slightly anxious with mobility, but less than previous sessions.      Exercises      General Comments        Pertinent Vitals/Pain Pain Assessment: Faces Faces Pain Scale: Hurts little more Pain Location: back Pain Descriptors / Indicators: Sore Pain Intervention(s): Monitored during session;Repositioned    Home Living                      Prior Function            PT Goals (current goals can now be found in the care plan section) Acute Rehab PT Goals Patient Stated Goal: return home without need for WC Potential to Achieve  Goals: Fair Progress towards PT goals: Progressing toward goals    Frequency    Min 3X/week      PT Plan Current plan remains appropriate    Co-evaluation PT/OT/SLP Co-Evaluation/Treatment: Yes Reason for Co-Treatment: Complexity of the patient's impairments (multi-system involvement);For patient/therapist safety PT goals addressed during session: Mobility/safety with mobility OT goals addressed during session: ADL's and self-care      AM-PAC PT "6 Clicks" Mobility   Outcome Measure  Help needed turning from your back to your side while in a flat bed without using bedrails?: A Little Help needed moving from lying on your back to sitting on the side of a flat bed  without using bedrails?: A Little Help needed moving to and from a bed to a chair (including a wheelchair)?: A Lot Help needed standing up from a chair using your arms (e.g., wheelchair or bedside chair)?: A Lot Help needed to walk in hospital room?: A Lot Help needed climbing 3-5 steps with a railing? : Total 6 Click Score: 13    End of Session Equipment Utilized During Treatment: Gait belt Activity Tolerance: Patient tolerated treatment well Patient left: in chair;with call bell/phone within reach;with chair alarm set Nurse Communication: Mobility status PT Visit Diagnosis: Difficulty in walking, not elsewhere classified (R26.2);Muscle weakness (generalized) (M62.81);Pain Pain - Right/Left: Right Pain - part of body: Ankle and joints of foot     Time: 1122-1159 PT Time Calculation (min) (ACUTE ONLY): 37 min  Charges:  $Gait Training: 8-22 mins                     [name] , PTA Acute Rehabilitation Services Pager [phone] Office [phone]     [name] 02/02/2020, 4:16 PM

## 2020-02-02 NOTE — Progress Notes (Signed)
Pt's scr has risen to 2.91. D/w Corrie Dandy Person from ortho and we will hold enalapril and metformin.   Ulyses Southward, PharmD, BCIDP, AAHIVP, CPP Infectious Disease Pharmacist 02/02/2020 10:22 AM

## 2020-02-02 NOTE — Plan of Care (Signed)
  Problem: Education: Goal: Knowledge of General Education information will improve Description: Including pain rating scale, medication(s)/side effects and non-pharmacologic comfort measures Outcome: Progressing   Problem: Activity: Goal: Risk for activity intolerance will decrease Outcome: Progressing   Problem: Nutrition: Goal: Adequate nutrition will be maintained Outcome: Progressing   

## 2020-02-02 NOTE — TOC Progression Note (Signed)
Transition of Care [facility]) - Progression Note    Patient Details  Name: [name] MRN: [id number] Date of Birth: 02-01-1961  Transition of [facility] CM/SW Contact  Curlene Labrum, RN Phone Number: 02/02/2020, 4:33 PM  Clinical Narrative:    Case Management met with the patient and the patient has declined SNF placement and would like to go home with home health services tomorrow if medically clear.  Patient given choice regarding home health and dme services.  Patient did not have a preference.  Called [employer] and Rolling Walker and 3:1 ordered to be delivered to her room. [facility] called and order home health PT/OT - waiting for return call from [facility].   Expected Discharge Plan: [place] Barriers to Discharge: Continued Medical Work up, SNF Pending bed offer  Expected Discharge Plan and Services Expected Discharge Plan: [place] arrangements for the past 2 months: Single Family Home                                       Social Determinants of Health (SDOH) Interventions    Readmission Risk Interventions No flowsheet data found.

## 2020-02-02 NOTE — Progress Notes (Signed)
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 PMH includes: asthma, CHF, chronic pain, CAD, gout, HLD, HTN, Mixed Connective Tissue Disease, obesity, and DM II.   OT comments  Pt progressing toward established OT goals. Pt required minA+2 for stand-pivot transfer to [facility]. She required totalA for posterior care in standing. Educated pt on use of toilet tongs and body positioning to maximize independence with toileting. Pt took 8 hops to end of bed with chair follow and 1 person assist, she required vc for proper use of RW. Pt will continue to benefit from skilled OT services to maximize safety and independence with ADL/IADL and functional mobility. Will continue to follow acutely and progress as tolerated.    Follow Up Recommendations  SNF    Equipment Recommendations  Other (comment)(TBD)    Recommendations for Other Services      Precautions / Restrictions Precautions Precautions: Fall Precaution Comments: wound vac RLE Restrictions Weight Bearing Restrictions: Yes RLE Weight Bearing: Non weight bearing       Mobility Bed Mobility               General bed mobility comments: up in chair on arrival  Transfers Overall transfer level: Needs assistance Equipment used: Rolling walker (2 wheeled)(youth height) Transfers: Sit to/from Stand Sit to Stand: Min assist Stand pivot transfers: +2 safety/equipment;Min assist       General transfer comment: minA for powerup into standing, vc for safe hand placement    Balance Overall balance assessment: Needs assistance Sitting-balance support: Feet supported Sitting balance-[name]: Good     Standing balance support: Bilateral upper extremity supported;During functional activity Standing  balance-[name]: Poor Standing balance comment: heavy reliance on UE support                           ADL either performed or assessed with clinical judgement   ADL Overall ADL's : Needs assistance/impaired Eating/Feeding: Set up;Sitting   Grooming: Set up;Sitting               Lower Body Dressing: Moderate assistance;Maximal assistance;Sit to/from stand;Sitting/lateral leans Lower Body Dressing Details (indicate cue type and reason): maxA to don compression sock;pt applying lotion to LLE Toilet Transfer: +2 for safety/equipment;Minimal assistance;Stand-pivot;RW;BSC Toilet Transfer Details (indicate cue type and reason): verbal cues x1 for maintaining NWB status with pivot to bedside commode.  Toileting- Clothing Manipulation and Hygiene: Total assistance;Sit to/from stand Toileting - Clothing Manipulation Details (indicate cue type and reason): totalA for pericare;discussed use of toilet tongs to increase independence with toileting     Functional mobility during ADLs: Minimal assistance;+2 for safety/equipment;Rolling walker;Cueing for safety General ADL Comments: minA+2 for safety     Vision       Perception     Praxis      Cognition Arousal/Alertness: Awake/alert Behavior During Therapy: WFL for tasks assessed/performed;Anxious Overall Cognitive Status: Within Functional Limits for tasks assessed                                 General Comments: pt appeared slightly anxious with mobility        Exercises     Shoulder Instructions  General Comments      Pertinent Vitals/ Pain       Pain Assessment: Faces Faces Pain Scale: Hurts little more Pain Location: back Pain Descriptors / Indicators: Sore Pain Intervention(s): Limited activity within patient's tolerance;Monitored during session  Home Living                                          Prior Functioning/Environment              Frequency   Min 2X/week        Progress Toward Goals  OT Goals(current goals can now be found in the care plan section)  Progress towards OT goals: Progressing toward goals  Acute Rehab OT Goals Patient Stated Goal: return home without need for WC OT Goal Formulation: With patient Time For Goal Achievement: 02/12/20 Potential to Achieve Goals: Good ADL Goals Pt Will Perform Lower Body Dressing: with min assist;sitting/lateral leans;sit to/from stand;with adaptive equipment Pt Will Transfer to Toilet: with min assist;bedside commode;stand pivot transfer(walker) Pt Will Perform Toileting - Clothing Manipulation and hygiene: with min assist;sitting/lateral leans;sit to/from stand Pt/caregiver will Perform Home Exercise Program: Increased strength;Both right and left upper extremity;Independently;With theraband  Plan Discharge plan remains appropriate    Co-evaluation    PT/OT/SLP Co-Evaluation/Treatment: Yes Reason for Co-Treatment: For patient/therapist safety;To address functional/ADL transfers   OT goals addressed during session: ADL's and self-care      AM-PAC OT "6 Clicks" Daily Activity     Outcome Measure   Help from another person eating meals?: A Little Help from another person taking care of personal grooming?: A Little Help from another person toileting, which includes using toliet, bedpan, or urinal?: A Lot Help from another person bathing (including washing, rinsing, drying)?: A Lot Help from another person to put on and taking off regular upper body clothing?: A Little Help from another person to put on and taking off regular lower body clothing?: A Lot 6 Click Score: 15    End of Session Equipment Utilized During Treatment: Rolling walker;Gait belt  OT Visit Diagnosis: Unsteadiness on feet (R26.81);Other abnormalities of gait and mobility (R26.89);Pain Pain - Right/Left: Right Pain - part of body: (back)   Activity Tolerance Patient tolerated treatment well    Patient Left with call bell/phone within reach;with nursing/sitter in room;in chair;with chair alarm set(seated EOB)   Nurse Communication Mobility status;Patient requests pain meds;Weight bearing status        Time: 9163-8466 OT Time Calculation (min): 37 min  Charges: OT General Charges $OT Visit: 1 Visit OT Treatments $Self Care/Home Management : 8-22 mins  Rosey Bath OTR/L Acute Rehabilitation Services Office: [phone]    Rebeca Alert 02/02/2020, 12:42 PM

## 2020-02-02 NOTE — Progress Notes (Signed)
Patient is status post irrigation and debridement of abscess of her right foot.  There is no drainage in the canister.  She is comfortable sitting at bedside this morning  Vital signs stable afebrile.  Patient lost IV access over the weekend and was placed on Augmentin after culture sensitivities were reviewed appropriate antibiotic.  She will need to be on this for 2 weeks.  Discussed discharge planning with patient.  She is going to discuss this with her husband today.  He is not sure he can have their home accessible by discharge but she would prefer this if she cannot find an acceptable placement at a nursing facility.  I will order a Covid test today.  We will plan for discharge to nursing facility or home tomorrow

## 2020-02-03 ENCOUNTER — Inpatient Hospital Stay (HOSPITAL_COMMUNITY): Payer: 59

## 2020-02-03 LAB — URINALYSIS, ROUTINE W REFLEX MICROSCOPIC
Bilirubin Urine: NEGATIVE
Glucose, UA: NEGATIVE mg/dL
Hgb urine dipstick: NEGATIVE
Ketones, ur: NEGATIVE mg/dL
Leukocytes,Ua: NEGATIVE
Nitrite: NEGATIVE
Protein, ur: NEGATIVE mg/dL
Specific Gravity, Urine: 1.008 (ref 1.005–1.030)
pH: 5 (ref 5.0–8.0)

## 2020-02-03 LAB — AEROBIC/ANAEROBIC CULTURE W GRAM STAIN (SURGICAL/DEEP WOUND)

## 2020-02-03 LAB — BASIC METABOLIC PANEL
Anion gap: 16 — ABNORMAL HIGH (ref 5–15)
BUN: 21 mg/dL — ABNORMAL HIGH (ref 6–20)
CO2: 22 mmol/L (ref 22–32)
Calcium: 9 mg/dL (ref 8.9–10.3)
Chloride: 97 mmol/L — ABNORMAL LOW (ref 98–111)
Creatinine, Ser: 3.37 mg/dL — ABNORMAL HIGH (ref 0.44–1.00)
GFR calc Af Amer: 17 mL/min — ABNORMAL LOW (ref >60)
GFR calc non Af Amer: 14 mL/min — ABNORMAL LOW (ref >60)
Glucose, Bld: 163 mg/dL — ABNORMAL HIGH (ref 70–99)
Potassium: 5.1 mmol/L (ref 3.5–5.1)
Sodium: 135 mmol/L (ref 135–145)

## 2020-02-03 LAB — SODIUM, URINE, RANDOM: Sodium, Ur: 29 mmol/L

## 2020-02-03 LAB — GLUCOSE, CAPILLARY
Glucose-Capillary: 177 mg/dL — ABNORMAL HIGH (ref 70–99)
Glucose-Capillary: 199 mg/dL — ABNORMAL HIGH (ref 70–99)
Glucose-Capillary: 236 mg/dL — ABNORMAL HIGH (ref 70–99)
Glucose-Capillary: 187 mg/dL — ABNORMAL HIGH (ref 70–99)

## 2020-02-03 LAB — CREATININE, URINE, RANDOM: Creatinine, Urine: 121.68 mg/dL

## 2020-02-03 MED ORDER — CIPROFLOXACIN HCL 500 MG PO TABS: 500.0000 mg | ORAL_TABLET | Freq: Every day | ORAL | Status: DC

## 2020-02-03 MED ORDER — FLUCONAZOLE 150 MG PO TABS: ORAL_TABLET | ORAL | 0 refills | Status: DC

## 2020-02-03 MED ORDER — PROMETHAZINE HCL 25 MG PO TABS: 25.0000 mg | ORAL_TABLET | Freq: Four times a day (QID) | ORAL | 1 refills | Status: DC | PRN

## 2020-02-03 MED ORDER — CIPROFLOXACIN HCL 500 MG PO TABS
500.0000 mg | ORAL_TABLET | Freq: Every day | ORAL | Status: DC
  Administered 2020-02-03 – 2020-02-04 (×2): 500 mg via ORAL
  Filled 2020-02-03 (×2): qty 1

## 2020-02-03 MED ORDER — GABAPENTIN 100 MG PO CAPS
100.0000 mg | ORAL_CAPSULE | Freq: Three times a day (TID) | ORAL | Status: DC
  Administered 2020-02-03 – 2020-02-04 (×3): 100 mg via ORAL
  Filled 2020-02-03 (×3): qty 1

## 2020-02-03 MED ORDER — SODIUM CHLORIDE 0.9 % IV BOLUS
250.0000 mL | Freq: Once | INTRAVENOUS | Status: AC
  Administered 2020-02-03: 250 mL via INTRAVENOUS

## 2020-02-03 MED ORDER — CIPROFLOXACIN HCL 500 MG PO TABS: 500.0000 mg | ORAL_TABLET | Freq: Two times a day (BID) | ORAL | 0 refills | Status: DC

## 2020-02-03 MED ORDER — SODIUM CHLORIDE 0.9 % IV SOLN: INTRAVENOUS | Status: AC

## 2020-02-03 MED ORDER — CIPROFLOXACIN HCL 500 MG PO TABS: 500.0000 mg | ORAL_TABLET | Freq: Two times a day (BID) | ORAL | Status: DC

## 2020-02-03 MED ORDER — DILTIAZEM HCL ER COATED BEADS 120 MG PO CP24
120.0000 mg | ORAL_CAPSULE | Freq: Every day | ORAL | Status: DC
  Administered 2020-02-04: 120 mg via ORAL
  Filled 2020-02-03: qty 1

## 2020-02-03 MED ORDER — CIPROFLOXACIN HCL 250 MG PO TABS: 250.0000 mg | ORAL_TABLET | Freq: Two times a day (BID) | ORAL | 0 refills | Status: DC

## 2020-02-03 NOTE — Consult Note (Signed)
[facility]  INPATIENT CONSULTATION  Reason for Consultation: AKI Requesting Provider: Dr. Sharol Given  HPI: [name] is an 59 y.o. female with HTN, MCTD, RA, CAD s/p PCI, HFpEF, chronic back pain, HL who is seen for evaluation and management of AKI in setting of R 4th/5th ray amputation.    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 MAPs in OR 80-102, no pressors.   Started on vanc and pip/tazo post op while cultures pending. POD1 Bps in 100/40-50s through morning and afternoon.  Now mainly in 110-120s but still some 100/50s.  No UOP or weights documented recently.  Says has chronic nausea unclear etiology at home, worse here; felt thirsty a lot here but couldn't drink.  Home dose of lasix is 10 BID PRN - takes a few days a week with good diuretic response; here has been getting lasix 60 BID.  Reports years of weak urinary stream not different.  No NSAIDs at home; no tea or cola colored urine.  No rashes.   01/21/20 Cr 0.99 > 5/20 post po 1.2 > 5/21 1.2 > 5/22 1.06 > 5/23 1.94 > 5/24 2.91 > 5/25 3.37.   Enalapril 10 BID d/cd 5/24. Lasix 60 BID d/cd this AM.  Metformin D/Cd yesterday.  No NSAID use.  Last UA in 2020 no blood or protein.  Outpt BPs generally 130-140/60s.   Home meds include: enalapril 10 BID, lasix 10 BID PRN, plaquenil 200, plavix, diltiazem 300 daily - she says she's not sure what the diltiazem is rx'd for.   PMH: Past Medical History:  Diagnosis Date  . Asthma    "on daily RX and rescue inhaler" (04/18/2018)  . Chronic diastolic CHF (congestive heart failure) ([place]) 09/2015  . Chronic lower back pain   . Chronic neck pain   . Chronic pain syndrome    Fentanyl Patch  . Colon polyps   . Coronary artery disease    cath with normal LM, 30% LAD, 85% mid RCA and 95% distal RCA s/p PCI of the mid to distal RCA and now on DAPT with ASA and Ticagrelor.    . Eczema   . Excessive daytime sleepiness 11/26/2015  . Fibromyalgia   .  Gallstones   . GERD (gastroesophageal reflux disease)   . Heart murmur    "noted for the 1st time on 04/18/2018"  . History of blood transfusion 07/2010   "S/P oophorectomy"  . History of gout   . History of hiatal hernia 1980s   "gone now" (04/18/2018)  . Hyperlipidemia   . Hypertension    takes Metoprolol and Enalapril daily  . Hypothyroidism    takes Synthroid daily  . IBS (irritable bowel syndrome)   . Migraine    "nothing in the 2000s" (04/18/2018)  . Mixed connective tissue disease ([place])   . NAFLD (nonalcoholic fatty liver disease)   . Pneumonia    "several times" (04/18/2018)  . Rheumatoid arthritis ([place])    "hands, elbows, shoulders, probably knees" (04/18/2018)  . Scoliosis   . Spondylosis   . Type II diabetes mellitus ([place])    takes Metformin and Hum R daily (04/18/2018)   PSH: Past Surgical History:  Procedure Laterality Date  . ABDOMINAL HYSTERECTOMY  06/2005   "w/right ovariy"  . AMPUTATION Right 01/28/2020    RIGHT FOURTH AND FIFTH RAY AMPUTATION  . AMPUTATION Right 01/28/2020   Procedure: RIGHT FOURTH AND FIFTH RAY AMPUTATION,;  Surgeon: Newt Minion, MD;  Location: [facility]  OR;  Service: Orthopedics;  Laterality: Right;  . APPENDECTOMY    . BREAST BIOPSY Bilateral    9 total (04/18/2018)  . CORONARY ANGIOPLASTY WITH STENT PLACEMENT  10/01/2015   normal LM, 30% LAD, 85% mid RCA and 95% distal RCA s/p PCI of the mid to distal RCA and now on DAPT with ASA and Ticagrelor.    . CORONARY STENT INTERVENTION Right 04/18/2018   Procedure: CORONARY STENT INTERVENTION;  Surgeon: [name], MD;  Location: [facility] INVASIVE CV LAB;  Service: Cardiovascular;  Laterality: Right;  . DILATION AND CURETTAGE OF UTERUS    . FRACTURE SURGERY    . LAPAROSCOPIC CHOLECYSTECTOMY    . LEFT HEART CATH AND CORONARY ANGIOGRAPHY N/A 04/18/2018   Procedure: LEFT HEART CATH AND CORONARY ANGIOGRAPHY;  Surgeon: [name], MD;  Location: [facility] INVASIVE CV LAB;  Service: Cardiovascular;   Laterality: N/A;  . MUSCLE BIOPSY Left    "leg"  . OOPHORECTOMY  07/2010  . RADIOLOGY WITH ANESTHESIA N/A 05/25/2016   Procedure: RADIOLOGY WITH ANESTHESIA;  Surgeon: Medication Radiologist, MD;  Location: [facility] OR;  Service: Radiology;  Laterality: N/A;  . TONSILLECTOMY AND ADENOIDECTOMY    . WRIST FRACTURE SURGERY Left    "crushed it"    Past Medical History:  Diagnosis Date  . Asthma    "on daily RX and rescue inhaler" (04/18/2018)  . Chronic diastolic CHF (congestive heart failure) ([facility]) 09/2015  . Chronic lower back pain   . Chronic neck pain   . Chronic pain syndrome    Fentanyl Patch  . Colon polyps   . Coronary artery disease    cath with normal LM, 30% LAD, 85% mid RCA and 95% distal RCA s/p PCI of the mid to distal RCA and now on DAPT with ASA and Ticagrelor.    . Eczema   . Excessive daytime sleepiness 11/26/2015  . Fibromyalgia   . Gallstones   . GERD (gastroesophageal reflux disease)   . Heart murmur    "noted for the 1st time on 04/18/2018"  . History of blood transfusion 07/2010   "S/P oophorectomy"  . History of gout   . History of hiatal hernia 1980s   "gone now" (04/18/2018)  . Hyperlipidemia   . Hypertension    takes Metoprolol and Enalapril daily  . Hypothyroidism    takes Synthroid daily  . IBS (irritable bowel syndrome)   . Migraine    "nothing in the 2000s" (04/18/2018)  . Mixed connective tissue disease ([facility])   . NAFLD (nonalcoholic fatty liver disease)   . Pneumonia    "several times" (04/18/2018)  . Rheumatoid arthritis ([facility])    "hands, elbows, shoulders, probably knees" (04/18/2018)  . Scoliosis   . Spondylosis   . Type II diabetes mellitus ([facility])    takes Metformin and Hum R daily (04/18/2018)    Medications:  I have reviewed the patient's current medications.  Medications Prior to Admission  Medication Sig Dispense Refill  . acetaminophen (TYLENOL) 650 MG CR tablet Take 1,300 mg by mouth in the morning and at bedtime.    Marland Kitchen albuterol (PROVENTIL)  (2.5 MG/3ML) 0.083% nebulizer solution Take 3 mLs (2.5 mg total) by nebulization every 6 (six) hours as needed for wheezing or shortness of breath. (Patient taking differently: Take 2.5-5 mg by nebulization every 4 (four) hours as needed for wheezing or shortness of breath. ) 150 mL 1  . aspirin EC 81 MG tablet Take 81 mg by mouth daily.    Marland Kitchen  clopidogrel (PLAVIX) 75 MG tablet Take 1 tablet by mouth once daily with breakfast (Patient taking differently: Take 75 mg by mouth daily. ) 90 tablet 3  . diltiazem (CARDIZEM CD) 300 MG 24 hr capsule Take 1 capsule (300 mg total) by mouth daily. 90 capsule 3  . diphenhydrAMINE (BENADRYL) 25 MG tablet Take 25 mg by mouth daily as needed for allergies.    Marland Kitchen enalapril (VASOTEC) 10 MG tablet Take 1 tablet (10 mg total) by mouth 2 (two) times daily. 180 tablet 3  . EPINEPHrine (EPIPEN 2-PAK) 0.3 mg/0.3 mL IJ SOAJ injection USE AS DIRECTED FOR SEVERE ALLERGIC REACTION. (Patient taking differently: Inject 0.3 mg into the muscle as needed for anaphylaxis. ) 2 each 0  . fluticasone (FLONASE) 50 MCG/ACT nasal spray USE 2 SPRAY(S) IN EACH NOSTRIL ONCE DAILY AS NEEDED FOR  ALLERGIES  OR  RHINITIS (Patient taking differently: Place 2 sprays into both nostrils in the morning and at bedtime. ) 16 g 0  . furosemide (LASIX) 40 MG tablet Take 1.5 tablets (60 mg total) by mouth 2 (two) times daily. (Patient taking differently: Take 20-80 mg by mouth 2 (two) times daily as needed for edema. ) 90 tablet 0  . gabapentin (NEURONTIN) 100 MG capsule Take 400-700 mg by mouth See admin instructions. Pt takes 400mg  in the morning, 400mg  at lunchtime and 700mg  in the evening    . Guaifenesin (MUCINEX MAXIMUM STRENGTH) 1200 MG TB12 Take 1,200 mg by mouth 2 (two) times daily.    guaiFENesin-dextromethorphan (ROBITUSSIN DM) 100-10 MG/5ML syrup Take 5 mLs by mouth every 4 (four) hours as needed for cough.    . hydroxychloroquine (PLAQUENIL) 200 MG tablet Take 200 mg by mouth 2 (two) times daily.     . insulin regular human CONCENTRATED (HUMULIN R) 500 UNIT/ML injection Inject 40-150 Units into the skin 3 (three) times daily with meals.     . isosorbide mononitrate (IMDUR) 30 MG 24 hr tablet Take 1 tablet (30 mg total) by mouth daily. 90 tablet 3  . levocetirizine (XYZAL) 5 MG tablet TAKE 1 TABLET BY MOUTH ONCE DAILY IN THE EVENING (Patient taking differently: Take 5 mg by mouth every evening. ) 90 tablet 1  . levothyroxine (SYNTHROID, LEVOTHROID) 200 MCG tablet Take 200 mcg by mouth daily before breakfast.     . magnesium oxide (MAG-OX) 400 MG tablet Take 400-1,600 mg by mouth 2 (two) times daily as needed (cramps).    . metFORMIN (GLUCOPHAGE-XR) 500 MG 24 hr tablet Take 2 tablets (1,000 mg total) by mouth 2 (two) times daily. 360 tablet 1  . mupirocin ointment (BACTROBAN) 2 % APPLY OINTMENT INTO THE NOSE 2(TWO) TIMES DAILY (Patient taking differently: Apply 1 application topically daily as needed (sore). ) 22 g 0  . nitroGLYCERIN (NITROSTAT) 0.4 MG SL tablet DISSOLVE ONE TABLET UNDER THE TONGUE EVERY 5 MINUTES AS NEEDED FOR CHEST PAIN.  DO NOT EXCEED A TOTAL OF 3 DOSES IN 15 MINUTES (Patient taking differently: Place 0.4 mg under the tongue every 5 (five) minutes as needed for chest pain. ) 25 tablet 0  . ondansetron (ZOFRAN) 4 MG tablet Take 8 mg by mouth every 6 (six) hours as needed for nausea or vomiting.    . Oxycodone HCl 10 MG TABS Take 10 mg by mouth every 6 (six) hours as needed (pain).     oxymetazoline (AFRIN) 0.05 % nasal spray Place 1 spray into both nostrils 2 (two) times daily as needed for congestion.     pantoprazole (PROTONIX) 40 MG tablet Take 1 tablet by mouth once daily (Patient taking differently: Take 40 mg by mouth daily. ) 90 tablet 3  . Phenylephrine HCl (NEO-SYNEPHRINE NA) Place 1 spray into both nostrils daily as needed (congestion).    Bertram Gala Glycol-Propyl Glycol (SYSTANE ULTRA OP) Place 2 drops into both eyes daily as needed (dry eyes).    . potassium  chloride (KLOR-CON) 10 MEQ tablet Take 1 tab for every 20 mg of Lasix taken. (Patient taking differently: Take 10-20 mEq by mouth See admin instructions. Take 10 meq as needed with 20 mg of lasix (has 40 mg lasix so when pt takes 0.5 tab (20 mg) take 10 meq, 1 tab (40 mg) 20 meq, 2 tab (80 mg) 40 meq) 180 tablet 2  . pramipexole (MIRAPEX) 0.5 MG tablet Take 0.5 mg by mouth See admin instructions. Take 0.5 mg up to 6 times daily as needed for restless legs    . pramipexole (MIRAPEX) 1 MG tablet Take 1 mg by mouth in the morning and at bedtime.    Marland Kitchen PROAIR HFA 108 (90 Base) MCG/ACT inhaler INHALE 2 PUFFS BY MOUTH EVERY 4 HOURS AS NEEDED FOR WHEEZING AND FOR SHORTNESS OF BREATH (Patient taking differently: Inhale 2 puffs into the lungs every 4 (four) hours as needed for wheezing or shortness of breath. ) 18 g 0  . promethazine-dextromethorphan (PROMETHAZINE-DM) 6.25-15 MG/5ML syrup TAKE 5 ML BY MOUTH  4 TIMES DAILY AS NEEDED (Patient taking differently: Take 5 mLs by mouth 4 (four) times daily as needed for cough. ) 118 mL 0  . saccharomyces boulardii (FLORASTOR) 250 MG capsule Take 250 mg by mouth 3 (three) times daily.    . Vitamin D, Ergocalciferol, (DRISDOL) 1.25 MG (50000 UNIT) CAPS capsule Take 1 capsule (50,000 Units total) by mouth every 7 (seven) days. Take on Friday (Patient taking differently: Take 50,000 Units by mouth every Friday. ) 12 capsule 3  . [DISCONTINUED] promethazine (PHENERGAN) 25 MG tablet TAKE 1 TABLET BY MOUTH EVERY 8 HOURS AS NEEDED FOR NAUSEA AND FOR VOMITING (Patient taking differently: Take 25 mg by mouth every 6 (six) hours as needed for nausea or vomiting. ) 30 tablet 0  . augmented betamethasone dipropionate (DIPROLENE-AF) 0.05 % cream APPLY 1 CREAM TOPICALLY ONCE DAILY AS NEEDED FOR  ECZEMA (Patient not taking: No sig reported) 15 g 3  . BREO ELLIPTA 200-25 MCG/INH AEPB Inhale 1 puff by mouth once daily (Patient not taking: Reported on 01/27/2020) 60 each 0  . clindamycin  (CLEOCIN) 300 MG capsule 600mg  po bid x 5 days (Patient not taking: Reported on 01/27/2020) 20 capsule 0  . clotrimazole-betamethasone (LOTRISONE) cream Apply 1 application topically 2 (two) times daily as needed (eczema).    . fluconazole (DIFLUCAN) 150 MG tablet Take 1 tab, repeat in 48 hours if no improvement. (Patient not taking: Reported on 01/27/2020) 2 tablet 0  . glucose blood (BAYER CONTOUR TEST) test strip 1 each 4 (four) times daily.     01/29/2020 ipratropium-albuterol (DUONEB) 0.5-2.5 (3) MG/3ML SOLN USE 3 ML (CC) IN NEBULIZER EVERY 4 HOURS AS NEEDED (Patient not taking: Reported on 01/27/2020) 360 mL 0  . liraglutide (VICTOZA) 18 MG/3ML SOPN Inject 0.3 mLs (1.8 mg total) into the skin daily. First week inject 0.6 mg daily then 1.2 mg daily for another week. Then move to 1.8 mg daily. (Patient not taking: Reported on 01/27/2020) 5 pen 11  . umeclidinium bromide (INCRUSE ELLIPTA) 62.5 MCG/INH AEPB Inhale 1 puff  into the lungs daily. (Patient not taking: Reported on 01/27/2020) 90 each 2  . [DISCONTINUED] fluconazole (DIFLUCAN) 150 MG tablet Take 1 tab, repeat in 48 hours if no improvement. (Patient taking differently: Take 150 mg by mouth daily as needed (yeast). ) 2 tablet 0    ALLERGIES:   Allergies  Allergen Reactions  . Fish Allergy Anaphylaxis    INCLUDES OMEGA 3 OILS  . Fish Oil Anaphylaxis and Swelling    THROAT SWELLS INCLUDES FISH AS A CLASS  . Omega-3 Fatty Acids Swelling    Throat swelling    . Crestor [Rosuvastatin] Other (See Comments)    Extreme joint pain, to this & other statins  . Gabapentin Anxiety    Anxiety on high doses  . Lovastatin Other (See Comments)    EXTREME JOINT PAIN  . Metronidazole Nausea Only and Swelling    Headache and shakes Nausea and vomiting  . Red Yeast Rice [Cholestin] Other (See Comments)    Muscle pain and severe joint pain.   . Rotigotine Swelling    Extreme edema   . Atrovent Hfa [Ipratropium Bromide Hfa] Other (See Comments)    wheezing   . Red Yeast Rice Extract     Joint pain  . Victoza [Liraglutide]     Unsure of reaction   . Suprep [Na Sulfate-K Sulfate-Mg Sulf] Nausea And Vomiting  . Tape Other (See Comments)    Electrodes causes skin breakdown    FAM HX: Family History  Problem Relation Age of Onset  . CAD Mother   . Hypertension Mother   . Heart attack Mother   . CAD Father   . Heart attack Father   . Allergic rhinitis Father   . Asthma Father   . Hypertension Brother   . Hypertension Brother   . Pancreatic cancer Paternal Aunt   . Breast cancer Paternal Aunt   . Lung cancer Paternal Aunt     Social History:   reports that she quit smoking about 15 years ago. Her smoking use included cigarettes. She has a 20.00 pack-year smoking history. She has never used smokeless tobacco. She reports current alcohol use. She reports previous drug use. Drug: Marijuana.  ROS: 12 system ROS neg per HPI above   Blood pressure (!) 153/66, pulse 93, temperature 98.6 F (37 C), temperature source Oral, resp. rate 14, height  (1.575 m), weight 131.5 kg, SpO2 90 %. PHYSICAL EXAM: Gen: obese, seated in bedside chair  Eyes: anicteric ENT: MM tacky, poor dentition Neck: supple CV:  RRR, no rub Abd:  Soft, obese Lungs: no rales, normal WOB GU: no foley Extr:  R foot wound vac, trace chronic LLE edema Neuro: nonfocal except reported neuropathy Skin: no rashes   Results for orders placed or performed during the hospital encounter of 01/28/20 (from the past 48 hour(s))  Glucose, capillary     Status: Abnormal   Collection Time: 02/01/20 11:49 AM  Result Value Ref Range   Glucose-Capillary 172 (H) 70 - 99 mg/dL    Comment: Glucose reference range applies only to samples taken after fasting for at least 8 hours.  Glucose, capillary     Status: Abnormal   Collection Time: 02/01/20  4:19 PM  Result Value Ref Range   Glucose-Capillary 219 (H) 70 - 99 mg/dL    Comment: Glucose reference range applies only to samples  taken after fasting for at least 8 hours.  Glucose, capillary     Status: Abnormal   Collection Time: 02/01/20  9:06 PM  Result Value Ref Range   Glucose-Capillary 178 (H) 70 - 99 mg/dL    Comment: Glucose reference range applies only to samples taken after fasting for at least 8 hours.   Comment 1 Document in Chart   Basic metabolic panel     Status: Abnormal   Collection Time: 02/02/20  2:32 AM  Result Value Ref Range   Sodium 137 135 - 145 mmol/L   Potassium 4.5 3.5 - 5.1 mmol/L   Chloride 99 98 - 111 mmol/L   CO2 25 22 - 32 mmol/L   Glucose, Bld 91 70 - 99 mg/dL    Comment: Glucose reference range applies only to samples taken after fasting for at least 8 hours.   BUN 14 6 - 20 mg/dL   Creatinine, Ser 7.59 (H) 0.44 - 1.00 mg/dL   Calcium 8.6 (L) 8.9 - 10.3 mg/dL   GFR calc non Af Amer 17 (L) >60 mL/min   GFR calc Af Amer 20 (L) >60 mL/min   Anion gap 13 5 - 15    Comment: Performed at [facility], [address] [place], [place] [address]  SARS CORONAVIRUS 2 (TAT 6-24 HRS) Nasopharyngeal Nasopharyngeal Swab     Status: None   Collection Time: 02/02/20  7:17 AM   Specimen: Nasopharyngeal Swab  Result Value Ref Range   SARS Coronavirus 2 NEGATIVE NEGATIVE    Comment: (NOTE) SARS-CoV-2 target nucleic acids are NOT DETECTED. The SARS-CoV-2 RNA is generally detectable in upper and lower respiratory specimens during the acute phase of infection. Negative results do not preclude SARS-CoV-2 infection, do not rule out co-infections with other pathogens, and should not be used as the sole basis for treatment or other patient management decisions. Negative results must be combined with clinical observations, patient history, and epidemiological information. The expected result is Negative. Fact Sheet for Patients: HairSlick.no Fact Sheet for Healthcare Providers: quierodirigir.com This test is not yet approved or cleared  by the [place] FDA and  has been authorized for detection and/or diagnosis of SARS-CoV-2 by FDA under an Emergency Use Authorization (EUA). This EUA will remain  in effect (meaning this test can be used) for the duration of the COVID-19 declaration under Section 56 4(b)(1) of the Act, 21 U.S.C. section 360bbb-3(b)(1), unless the authorization is terminated or revoked sooner. Performed at [facility], [address] [place], [place] [address]   Glucose, capillary     Status: Abnormal   Collection Time: 02/02/20  7:32 AM  Result Value Ref Range   Glucose-Capillary 174 (H) 70 - 99 mg/dL    Comment: Glucose reference range applies only to samples taken after fasting for at least 8 hours.  Glucose, capillary     Status: Abnormal   Collection Time: 02/02/20 11:41 AM  Result Value Ref Range   Glucose-Capillary 154 (H) 70 - 99 mg/dL    Comment: Glucose reference range applies only to samples taken after fasting for at least 8 hours.  Glucose, capillary     Status: Abnormal   Collection Time: 02/02/20  4:11 PM  Result Value Ref Range   Glucose-Capillary 271 (H) 70 - 99 mg/dL    Comment: Glucose reference range applies only to samples taken after fasting for at least 8 hours.  Glucose, capillary     Status: Abnormal   Collection Time: 02/02/20  9:19 PM  Result Value Ref Range   Glucose-Capillary 198 (H) 70 - 99 mg/dL    Comment:  Glucose reference range applies only to samples taken after fasting for at least 8 hours.  Basic metabolic panel     Status: Abnormal   Collection Time: 02/03/20  6:08 AM  Result Value Ref Range   Sodium 135 135 - 145 mmol/L   Potassium 5.1 3.5 - 5.1 mmol/L   Chloride 97 (L) 98 - 111 mmol/L   CO2 22 22 - 32 mmol/L   Glucose, Bld 163 (H) 70 - 99 mg/dL    Comment: Glucose reference range applies only to samples taken after fasting for at least 8 hours.   BUN 21 (H) 6 - 20 mg/dL   Creatinine, Ser 7.11 (H) 0.44 - 1.00 mg/dL   Calcium 9.0 8.9 - 65.7  mg/dL   GFR calc non Af Amer 14 (L) >60 mL/min   GFR calc Af Amer 17 (L) >60 mL/min   Anion gap 16 (H) 5 - 15    Comment: Performed at [facility], [address] [place], [place] [address]  Glucose, capillary     Status: Abnormal   Collection Time: 02/03/20  7:42 AM  Result Value Ref Range   Glucose-Capillary 177 (H) 70 - 99 mg/dL    Comment: Glucose reference range applies only to samples taken after fasting for at least 8 hours.    No results found.  Assessment/Plan **AKI:  Suspect it's multifactorial with hypovolemia and hypotension + ACEi.  No e/o autoimmune issue here or AIN but will check UA to make sure.  I/Os, daily weights. UA, urine sodium and creatinine.  Renal US today; bladder scan now.  Hold diuretics, IVF today given nausea NS bolus then 170mL/hr x 10hrs ordered.  I think if renal function improving and tolerating po intake tomorrow she can go home and f/u outpt. Dose meds for CrCl  15.  **HTN:  Modest hypotension, decrease diltiazem 300 to 120 daily for now, cont to hold ACEi.  Clarify with her re: isosorbide.  **DM:  Per primary  **MCTD/RA: follows with rheumatology, on plaquenil.  Well controlled symptoms.   Tyler Pita 02/03/2020, 10:39 AM

## 2020-02-03 NOTE — Progress Notes (Signed)
Patient is postop day 7 status post irrigation and debridement right foot.  She is doing well and is planning on going home with home health.  She is asking if she could go home on Phenergan.  She is having nausea which she believes is from the antibiotic which she is currently on specifically Augmentin.  Vital signs stable afebrile alert pleasant to exam.  She is comfortable.  Dressing intact Praveena pump only has 2 more days.  Plan I will discontinue Praveena pump and have dry dressing changes.  She may go home with home health.  I will give her prescriptions for Phenergan and Cipro which may be an antibiotic that she has had in the past.  It is sensitive to the current bacteria.  She is also asking for something for yeast infection.  I will order this as well follow-up in the office in 1 week

## 2020-02-03 NOTE — Discharge Summary (Deleted)
Discharge Diagnoses:  Active Problems:   Cutaneous abscess of right foot   Subacute osteomyelitis, right ankle and foot (HCC)   Acute hematogenous osteomyelitis of right foot (HCC)   Surgeries: Procedure(s): RIGHT FOURTH AND FIFTH RAY AMPUTATION, on 01/28/2020    Consultants:   Discharged Condition: Improved  Hospital Course: BERNARDETTE WALDRON is an 59 y.o. female who was admitted 01/28/2020 with a chief complaint of right foot abscess, with a final diagnosis of Abscess and Osteomyelitis Right Foot.  Patient was brought to the operating room on 01/28/2020 and underwent Procedure(s): RIGHT FOURTH AND FIFTH RAY AMPUTATION,.    Patient was given perioperative antibiotics:  Anti-infectives (From admission, onward)   Start     Dose/Rate Route Frequency Ordered Stop   02/03/20 0800  ciprofloxacin (CIPRO) tablet 500 mg  Status:  Discontinued     500 mg Oral 2 times daily 02/03/20 0714 02/03/20 0745   02/03/20 0800  ciprofloxacin (CIPRO) tablet 500 mg  Status:  Discontinued     500 mg Oral Daily with breakfast 02/03/20 0745 02/03/20 0746   02/03/20 0800  ciprofloxacin (CIPRO) tablet 250 mg     250 mg Oral 2 times daily 02/03/20 0746     02/03/20 0000  fluconazole (DIFLUCAN) 150 MG tablet        02/03/20 0725     02/03/20 0000  ciprofloxacin (CIPRO) 500 MG tablet  Status:  Discontinued     500 mg Oral 2 times daily 02/03/20 0725 02/03/20    02/03/20 0000  ciprofloxacin (CIPRO) 250 MG tablet     250 mg Oral 2 times daily 02/03/20 0748     02/01/20 1300  amoxicillin-clavulanate (AUGMENTIN) 875-125 MG per tablet 1 tablet  Status:  Discontinued     1 tablet Oral Every 12 hours 02/01/20 1236 02/03/20 0714   01/29/20 1000  vancomycin (VANCOCIN) IVPB 1000 mg/200 mL premix  Status:  Discontinued     1,000 mg 200 mL/hr over 60 Minutes Intravenous Every 12 hours 01/29/20 0938 02/01/20 1236   01/28/20 2200  hydroxychloroquine (PLAQUENIL) tablet 200 mg     200 mg Oral 2 times daily 01/28/20 1538      01/28/20 1600  vancomycin (VANCOREADY) IVPB 2000 mg/400 mL     2,000 mg 200 mL/hr over 120 Minutes Intravenous  Once 01/28/20 1551 01/28/20 1932   01/28/20 1600  piperacillin-tazobactam (ZOSYN) IVPB 3.375 g  Status:  Discontinued     3.375 g 12.5 mL/hr over 240 Minutes Intravenous Every 8 hours 01/28/20 1551 02/01/20 1236   01/28/20 0600  ceFAZolin (ANCEF) 3 g in dextrose 5 % 50 mL IVPB     3 g 100 mL/hr over 30 Minutes Intravenous On call to O.R. 01/27/20 1047 01/28/20 1306    .  Patient was given sequential compression devices, early ambulation, and aspirin for DVT prophylaxis.  Recent vital signs:  Patient Vitals for the past 24 hrs:  BP Temp Temp src Pulse Resp SpO2  02/03/20 0745 (!) 153/66 98.6 F (37 C) Oral 93 14 90 %  02/03/20 0433 126/64 98.3 F (36.8 C) Oral 93 18 90 %  02/02/20 1957 129/69 98.4 F (36.9 C) Oral 100 14 94 %  02/02/20 1403 (!) 124/59 98.4 F (36.9 C) Oral 95 14 96 %  .  Recent laboratory studies: No results found.  Discharge Medications:   Allergies as of 02/03/2020      Reactions   Fish Allergy Anaphylaxis   INCLUDES OMEGA 3 OILS   Fish  Oil Anaphylaxis, Swelling   THROAT SWELLS INCLUDES FISH AS A CLASS   Omega-3 Fatty Acids Swelling   Throat swelling    Crestor [rosuvastatin] Other (See Comments)   Extreme joint pain, to this & other statins   Gabapentin Anxiety   Anxiety on high doses   Lovastatin Other (See Comments)   EXTREME JOINT PAIN   Metronidazole Nausea Only, Swelling   Headache and shakes Nausea and vomiting   Red Yeast Rice [cholestin] Other (See Comments)   Muscle pain and severe joint pain.    Rotigotine Swelling   Extreme edema   Atrovent Hfa [ipratropium Bromide Hfa] Other (See Comments)   wheezing   Red Yeast Rice Extract    Joint pain   Victoza [liraglutide]    Unsure of reaction    Suprep [na Sulfate-k Sulfate-mg Sulf] Nausea And Vomiting   Tape Other (See Comments)   Electrodes causes skin breakdown       Medication List    STOP taking these medications   clindamycin 300 MG capsule Commonly known as: CLEOCIN   ipratropium-albuterol 0.5-2.5 (3) MG/3ML Soln Commonly known as: DUONEB   liraglutide 18 MG/3ML Sopn Commonly known as: VICTOZA   mupirocin ointment 2 % Commonly known as: BACTROBAN   promethazine-dextromethorphan 6.25-15 MG/5ML syrup Commonly known as: PROMETHAZINE-DM     TAKE these medications   acetaminophen 650 MG CR tablet Commonly known as: TYLENOL Take 1,300 mg by mouth in the morning and at bedtime.   aspirin EC 81 MG tablet Take 81 mg by mouth daily.   augmented betamethasone dipropionate 0.05 % cream Commonly known as: DIPROLENE-AF APPLY 1 CREAM TOPICALLY ONCE DAILY AS NEEDED FOR  ECZEMA   Bayer Contour Test test strip Generic drug: glucose blood 1 each 4 (four) times daily.   Breo Ellipta 200-25 MCG/INH Aepb Generic drug: fluticasone furoate-vilanterol Inhale 1 puff by mouth once daily   ciprofloxacin 250 MG tablet Commonly known as: CIPRO Take 1 tablet (250 mg total) by mouth 2 (two) times daily.   clopidogrel 75 MG tablet Commonly known as: PLAVIX Take 1 tablet by mouth once daily with breakfast What changed: See the new instructions.   clotrimazole-betamethasone cream Commonly known as: LOTRISONE Apply 1 application topically 2 (two) times daily as needed (eczema).   diltiazem 300 MG 24 hr capsule Commonly known as: Cardizem CD Take 1 capsule (300 mg total) by mouth daily.   diphenhydrAMINE 25 MG tablet Commonly known as: BENADRYL Take 25 mg by mouth daily as needed for allergies.   enalapril 10 MG tablet Commonly known as: VASOTEC Take 1 tablet (10 mg total) by mouth 2 (two) times daily.   EPINEPHrine 0.3 mg/0.3 mL Soaj injection Commonly known as: EpiPen 2-Pak USE AS DIRECTED FOR SEVERE ALLERGIC REACTION. What changed:   how much to take  how to take this  when to take this  reasons to take this  additional  instructions   fluconazole 150 MG tablet Commonly known as: DIFLUCAN Take 1 tab, repeat in 48 hours if no improvement. What changed:   how much to take  how to take this  when to take this  reasons to take this  additional instructions  Another medication with the same name was removed. Continue taking this medication, and follow the directions you see here.   fluticasone 50 MCG/ACT nasal spray Commonly known as: FLONASE USE 2 SPRAY(S) IN EACH NOSTRIL ONCE DAILY AS NEEDED FOR  ALLERGIES  OR  RHINITIS What changed: See the new  instructions.   furosemide 40 MG tablet Commonly known as: LASIX Take 1.5 tablets (60 mg total) by mouth 2 (two) times daily. What changed:   how much to take  when to take this  reasons to take this   gabapentin 100 MG capsule Commonly known as: NEURONTIN Take 400-700 mg by mouth See admin instructions. Pt takes 400mg  in the morning, 400mg  at lunchtime and 700mg  in the evening   guaiFENesin-dextromethorphan 100-10 MG/5ML syrup Commonly known as: ROBITUSSIN DM Take 5 mLs by mouth every 4 (four) hours as needed for cough.   HUMULIN R 500 UNIT/ML injection Generic drug: insulin regular human CONCENTRATED Inject 40-150 Units into the skin 3 (three) times daily with meals.   hydroxychloroquine 200 MG tablet Commonly known as: PLAQUENIL Take 200 mg by mouth 2 (two) times daily.   isosorbide mononitrate 30 MG 24 hr tablet Commonly known as: IMDUR Take 1 tablet (30 mg total) by mouth daily.   levocetirizine 5 MG tablet Commonly known as: XYZAL TAKE 1 TABLET BY MOUTH ONCE DAILY IN THE EVENING   levothyroxine 200 MCG tablet Commonly known as: SYNTHROID Take 200 mcg by mouth daily before breakfast.   magnesium oxide 400 MG tablet Commonly known as: MAG-OX Take 400-1,600 mg by mouth 2 (two) times daily as needed (cramps).   metFORMIN 500 MG 24 hr tablet Commonly known as: GLUCOPHAGE-XR Take 2 tablets (1,000 mg total) by mouth 2 (two)  times daily.   Mucinex Maximum Strength 1200 MG Tb12 Generic drug: Guaifenesin Take 1,200 mg by mouth 2 (two) times daily.   NEO-SYNEPHRINE NA Place 1 spray into both nostrils daily as needed (congestion).   nitroGLYCERIN 0.4 MG SL tablet Commonly known as: NITROSTAT DISSOLVE ONE TABLET UNDER THE TONGUE EVERY 5 MINUTES AS NEEDED FOR CHEST PAIN.  DO NOT EXCEED A TOTAL OF 3 DOSES IN 15 MINUTES What changed: See the new instructions.   ondansetron 4 MG tablet Commonly known as: ZOFRAN Take 8 mg by mouth every 6 (six) hours as needed for nausea or vomiting.   Oxycodone HCl 10 MG Tabs Take 10 mg by mouth every 6 (six) hours as needed (pain).   oxymetazoline 0.05 % nasal spray Commonly known as: AFRIN Place 1 spray into both nostrils 2 (two) times daily as needed for congestion.   pantoprazole 40 MG tablet Commonly known as: PROTONIX Take 1 tablet by mouth once daily   potassium chloride 10 MEQ tablet Commonly known as: KLOR-CON Take 1 tab for every 20 mg of Lasix taken. What changed:   how much to take  how to take this  when to take this  additional instructions   pramipexole 0.5 MG tablet Commonly known as: MIRAPEX Take 0.5 mg by mouth See admin instructions. Take 0.5 mg up to 6 times daily as needed for restless legs What changed: Another medication with the same name was removed. Continue taking this medication, and follow the directions you see here.   ProAir HFA 108 (90 Base) MCG/ACT inhaler Generic drug: albuterol INHALE 2 PUFFS BY MOUTH EVERY 4 HOURS AS NEEDED FOR WHEEZING AND FOR SHORTNESS OF BREATH What changed: See the new instructions.   albuterol (2.5 MG/3ML) 0.083% nebulizer solution Commonly known as: PROVENTIL Take 3 mLs (2.5 mg total) by nebulization every 6 (six) hours as needed for wheezing or shortness of breath. What changed:   how much to take  when to take this   promethazine 25 MG tablet Commonly known as: PHENERGAN Take 1 tablet (25  mg total) by mouth every 6 (six) hours as needed for nausea or vomiting. What changed: See the new instructions.   saccharomyces boulardii 250 MG capsule Commonly known as: FLORASTOR Take 250 mg by mouth 3 (three) times daily.   SYSTANE ULTRA OP Place 2 drops into both eyes daily as needed (dry eyes).   umeclidinium bromide 62.5 MCG/INH Aepb Commonly known as: INCRUSE ELLIPTA Inhale 1 puff into the lungs daily.   Vitamin D (Ergocalciferol) 1.25 MG (50000 UNIT) Caps capsule Commonly known as: DRISDOL Take 1 capsule (50,000 Units total) by mouth every 7 (seven) days. 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 EXAM: PORTABLE CHEST 1 VIEW COMPARISON:  Chest x-ray 08/01/2019. FINDINGS: Film is under penetrated limiting the diagnostic sensitivity and specificity of the examination. With these limitations in mind, there is no definite acute consolidative airspace disease and no pleural effusions. No pneumothorax. Azygos lobe (normal anatomical variant) incidentally noted. Cephalization of the pulmonary vasculature, without frank pulmonary edema. Heart size appears borderline enlarged. Upper mediastinal contours are within normal limits. IMPRESSION: 1.  Heart size appears borderline enlarged and there is some cephalization of the pulmonary vasculature, but no frank pulmonary edema. Electronically Signed   By: Vinnie Langton M.D.   On: 01/21/2020 17:44   DG Foot Complete Right  Result Date: 01/21/2020 CLINICAL DATA:  Right foot ulcer. EXAM: RIGHT FOOT COMPLETE - 3+ VIEW COMPARISON:  None. FINDINGS: Acute fracture deformity is seen extending through the distal aspect of the fifth left metatarsal. There is no evidence of dislocation. There is no evidence of arthropathy or other focal bone abnormality. A 2.4 cm area of soft tissue ulceration is seen along the lateral aspect of the distal right foot. This is adjacent to the previous described fracture deformity. Moderate severity diffuse soft tissue swelling is seen. IMPRESSION: 1. Acute fracture of the fifth left metatarsal with an adjacent area of soft tissue ulceration. 2. Acute osteomyelitis cannot be excluded. MRI correlation is recommended. Electronically Signed   By: Virgina Norfolk M.D.   On: 01/21/2020 19:44    Patient benefited maximally from their hospital stay and there were no complications.     Disposition: Discharge disposition: 01-Home or Self Care      Discharge Instructions    Call MD / Call 911   Complete by: As directed    If you experience chest pain or shortness of breath, CALL 911 and be transported to the hospital emergency room.  If you develope a fever  above 101 F, pus (white drainage) or increased drainage or redness at the wound, or calf pain, call your surgeon's office.   Constipation Prevention   Complete by: As directed    Drink plenty of fluids.  Prune juice may be helpful.  You may use a stool softener, such as Colace (over the counter) 100 mg twice a day.  Use MiraLax (over the counter) for constipation as needed.   Diet - low sodium heart healthy   Complete by: As directed    Discharge instructions   Complete by: As directed    Elevate Right Leg. Daily  dry dressing change . May cleanse with mild soap and water but do not soak foot   Increase activity slowly as tolerated   Complete by: As directed      Follow-up Information    [facility] Follow up.   Why: [facility] will deliver your rolling walker and 3:1 commode to your room prior to discharge to home. Contact information: [address] [address] [phone]        [facility] Follow up.   Specialty: Home Health Services Why: [facility] will be providing physical therapy and occupational therapy.  You should receive a call within 24-48 hours of your discharge home.           Signed: [place] Kaytee Taliercio 02/03/2020, 7:50 AM

## 2020-02-03 NOTE — Discharge Summary (Deleted)
Discharge Diagnoses:  Active Problems:   Cutaneous abscess of right foot   Subacute osteomyelitis, right ankle and foot (HCC)   Acute hematogenous osteomyelitis of right foot (HCC)   Surgeries: Procedure(s): RIGHT FOURTH AND FIFTH RAY AMPUTATION, on 01/28/2020    Consultants:   Discharged Condition: Improved  Hospital Course: [name] is an 59 y.o. female who was admitted 01/28/2020 with a chief complaint of right foot abscess, with a final diagnosis of Abscess and Osteomyelitis Right Foot.  Patient was brought to the operating room on 01/28/2020 and underwent Procedure(s): RIGHT FOURTH AND FIFTH RAY AMPUTATION,.    Patient was given perioperative antibiotics:  Anti-infectives (From admission, onward)   Start     Dose/Rate Route Frequency Ordered Stop   02/03/20 0800  ciprofloxacin (CIPRO) tablet 500 mg     500 mg Oral 2 times daily 02/03/20 0714     02/03/20 0000  fluconazole (DIFLUCAN) 150 MG tablet        02/03/20 0725     02/03/20 0000  ciprofloxacin (CIPRO) 500 MG tablet     500 mg Oral 2 times daily 02/03/20 0725     02/01/20 1300  amoxicillin-clavulanate (AUGMENTIN) 875-125 MG per tablet 1 tablet  Status:  Discontinued     1 tablet Oral Every 12 hours 02/01/20 1236 02/03/20 0714   01/29/20 1000  vancomycin (VANCOCIN) IVPB 1000 mg/200 mL premix  Status:  Discontinued     1,000 mg 200 mL/hr over 60 Minutes Intravenous Every 12 hours 01/29/20 0938 02/01/20 1236   01/28/20 2200  hydroxychloroquine (PLAQUENIL) tablet 200 mg     200 mg Oral 2 times daily 01/28/20 1538     01/28/20 1600  vancomycin (VANCOREADY) IVPB 2000 mg/400 mL     2,000 mg 200 mL/hr over 120 Minutes Intravenous  Once 01/28/20 1551 01/28/20 1932   01/28/20 1600  piperacillin-tazobactam (ZOSYN) IVPB 3.375 g  Status:  Discontinued     3.375 g 12.5 mL/hr over 240 Minutes Intravenous Every 8 hours 01/28/20 1551 02/01/20 1236   01/28/20 0600  ceFAZolin (ANCEF) 3 g in dextrose 5 % 50 mL IVPB     3 g 100  mL/hr over 30 Minutes Intravenous On call to O.R. 01/27/20 1047 01/28/20 1306    .  Patient was given sequential compression devices, early ambulation, and aspirin for DVT prophylaxis.  Recent vital signs:  Patient Vitals for the past 24 hrs:  BP Temp Temp src Pulse Resp SpO2  02/03/20 0433 126/64 98.3 F (36.8 C) Oral 93 18 90 %  02/02/20 1957 129/69 98.4 F (36.9 C) Oral 100 14 94 %  02/02/20 1403 (!) 124/59 98.4 F (36.9 C) Oral 95 14 96 %  02/02/20 0735 (!) 114/59 98.3 F (36.8 C) Oral 96 14 92 %  .  Recent laboratory studies: No results found.  Discharge Medications:   Allergies as of 02/03/2020      Reactions   Fish Allergy Anaphylaxis   INCLUDES OMEGA 3 OILS   Fish Oil Anaphylaxis, Swelling   THROAT SWELLS INCLUDES FISH AS A CLASS   Omega-3 Fatty Acids Swelling   Throat swelling    Crestor [rosuvastatin] Other (See Comments)   Extreme joint pain, to this & other statins   Gabapentin Anxiety   Anxiety on high doses   Lovastatin Other (See Comments)   EXTREME JOINT PAIN   Metronidazole Nausea Only, Swelling   Headache and shakes Nausea and vomiting   Red Yeast Rice [cholestin] Other (See Comments)  Muscle pain and severe joint pain.    Rotigotine Swelling   Extreme edema   Atrovent Hfa [ipratropium Bromide Hfa] Other (See Comments)   wheezing   Red Yeast Rice Extract    Joint pain   Victoza [liraglutide]    Unsure of reaction    Suprep [na Sulfate-k Sulfate-mg Sulf] Nausea And Vomiting   Tape Other (See Comments)   Electrodes causes skin breakdown      Medication List    STOP taking these medications   clindamycin 300 MG capsule Commonly known as: CLEOCIN   ipratropium-albuterol 0.5-2.5 (3) MG/3ML Soln Commonly known as: DUONEB   liraglutide 18 MG/3ML Sopn Commonly known as: VICTOZA   mupirocin ointment 2 % Commonly known as: BACTROBAN   promethazine-dextromethorphan 6.25-15 MG/5ML syrup Commonly known as: PROMETHAZINE-DM     TAKE these  medications   acetaminophen 650 MG CR tablet Commonly known as: TYLENOL Take 1,300 mg by mouth in the morning and at bedtime.   aspirin EC 81 MG tablet Take 81 mg by mouth daily.   augmented betamethasone dipropionate 0.05 % cream Commonly known as: DIPROLENE-AF APPLY 1 CREAM TOPICALLY ONCE DAILY AS NEEDED FOR  ECZEMA   Bayer Contour Test test strip Generic drug: glucose blood 1 each 4 (four) times daily.   Breo Ellipta 200-25 MCG/INH Aepb Generic drug: fluticasone furoate-vilanterol Inhale 1 puff by mouth once daily   ciprofloxacin 500 MG tablet Commonly known as: CIPRO Take 1 tablet (500 mg total) by mouth 2 (two) times daily.   clopidogrel 75 MG tablet Commonly known as: PLAVIX Take 1 tablet by mouth once daily with breakfast What changed: See the new instructions.   clotrimazole-betamethasone cream Commonly known as: LOTRISONE Apply 1 application topically 2 (two) times daily as needed (eczema).   diltiazem 300 MG 24 hr capsule Commonly known as: Cardizem CD Take 1 capsule (300 mg total) by mouth daily.   diphenhydrAMINE 25 MG tablet Commonly known as: BENADRYL Take 25 mg by mouth daily as needed for allergies.   enalapril 10 MG tablet Commonly known as: VASOTEC Take 1 tablet (10 mg total) by mouth 2 (two) times daily.   EPINEPHrine 0.3 mg/0.3 mL Soaj injection Commonly known as: EpiPen 2-Pak USE AS DIRECTED FOR SEVERE ALLERGIC REACTION. What changed:   how much to take  how to take this  when to take this  reasons to take this  additional instructions   fluconazole 150 MG tablet Commonly known as: DIFLUCAN Take 1 tab, repeat in 48 hours if no improvement. What changed:   how much to take  how to take this  when to take this  reasons to take this  additional instructions  Another medication with the same name was removed. Continue taking this medication, and follow the directions you see here.   fluticasone 50 MCG/ACT nasal  spray Commonly known as: FLONASE USE 2 SPRAY(S) IN EACH NOSTRIL ONCE DAILY AS NEEDED FOR  ALLERGIES  OR  RHINITIS What changed: See the new instructions.   furosemide 40 MG tablet Commonly known as: LASIX Take 1.5 tablets (60 mg total) by mouth 2 (two) times daily. What changed:   how much to take  when to take this  reasons to take this   gabapentin 100 MG capsule Commonly known as: NEURONTIN Take 400-700 mg by mouth See admin instructions. Pt takes 400mg  in the morning, 400mg  at lunchtime and 700mg  in the evening   guaiFENesin-dextromethorphan 100-10 MG/5ML syrup Commonly known as: ROBITUSSIN DM Take 5  mLs by mouth every 4 (four) hours as needed for cough.   HUMULIN R 500 UNIT/ML injection Generic drug: insulin regular human CONCENTRATED Inject 40-150 Units into the skin 3 (three) times daily with meals.   hydroxychloroquine 200 MG tablet Commonly known as: PLAQUENIL Take 200 mg by mouth 2 (two) times daily.   isosorbide mononitrate 30 MG 24 hr tablet Commonly known as: IMDUR Take 1 tablet (30 mg total) by mouth daily.   levocetirizine 5 MG tablet Commonly known as: XYZAL TAKE 1 TABLET BY MOUTH ONCE DAILY IN THE EVENING   levothyroxine 200 MCG tablet Commonly known as: SYNTHROID Take 200 mcg by mouth daily before breakfast.   magnesium oxide 400 MG tablet Commonly known as: MAG-OX Take 400-1,600 mg by mouth 2 (two) times daily as needed (cramps).   metFORMIN 500 MG 24 hr tablet Commonly known as: GLUCOPHAGE-XR Take 2 tablets (1,000 mg total) by mouth 2 (two) times daily.   Mucinex Maximum Strength 1200 MG Tb12 Generic drug: Guaifenesin Take 1,200 mg by mouth 2 (two) times daily.   NEO-SYNEPHRINE NA Place 1 spray into both nostrils daily as needed (congestion).   nitroGLYCERIN 0.4 MG SL tablet Commonly known as: NITROSTAT DISSOLVE ONE TABLET UNDER THE TONGUE EVERY 5 MINUTES AS NEEDED FOR CHEST PAIN.  DO NOT EXCEED A TOTAL OF 3 DOSES IN 15 MINUTES What  changed: See the new instructions.   ondansetron 4 MG tablet Commonly known as: ZOFRAN Take 8 mg by mouth every 6 (six) hours as needed for nausea or vomiting.   Oxycodone HCl 10 MG Tabs Take 10 mg by mouth every 6 (six) hours as needed (pain).   oxymetazoline 0.05 % nasal spray Commonly known as: AFRIN Place 1 spray into both nostrils 2 (two) times daily as needed for congestion.   pantoprazole 40 MG tablet Commonly known as: PROTONIX Take 1 tablet by mouth once daily   potassium chloride 10 MEQ tablet Commonly known as: KLOR-CON Take 1 tab for every 20 mg of Lasix taken. What changed:   how much to take  how to take this  when to take this  additional instructions   pramipexole 0.5 MG tablet Commonly known as: MIRAPEX Take 0.5 mg by mouth See admin instructions. Take 0.5 mg up to 6 times daily as needed for restless legs What changed: Another medication with the same name was removed. Continue taking this medication, and follow the directions you see here.   ProAir HFA 108 (90 Base) MCG/ACT inhaler Generic drug: albuterol INHALE 2 PUFFS BY MOUTH EVERY 4 HOURS AS NEEDED FOR WHEEZING AND FOR SHORTNESS OF BREATH What changed: See the new instructions.   albuterol (2.5 MG/3ML) 0.083% nebulizer solution Commonly known as: PROVENTIL Take 3 mLs (2.5 mg total) by nebulization every 6 (six) hours as needed for wheezing or shortness of breath. What changed:   how much to take  when to take this   promethazine 25 MG tablet Commonly known as: PHENERGAN Take 1 tablet (25 mg total) by mouth every 6 (six) hours as needed for nausea or vomiting. What changed: See the new instructions.   saccharomyces boulardii 250 MG capsule Commonly known as: FLORASTOR Take 250 mg by mouth 3 (three) times daily.   SYSTANE ULTRA OP Place 2 drops into both eyes daily as needed (dry eyes).   umeclidinium bromide 62.5 MCG/INH Aepb Commonly known as: INCRUSE ELLIPTA Inhale 1 puff into the  lungs daily.   Vitamin D (Ergocalciferol) 1.25 MG (50000 UNIT) Caps  capsule Commonly known as: DRISDOL Take 1 capsule (50,000 Units total) by mouth every 7 (seven) days. 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 EXAM: PORTABLE CHEST 1 VIEW COMPARISON:  Chest x-ray 08/01/2019. FINDINGS: Film is under penetrated limiting the diagnostic sensitivity and specificity of the examination. With these limitations in mind, there is no definite acute consolidative airspace disease and no pleural effusions. No pneumothorax. Azygos lobe (normal anatomical variant) incidentally noted. Cephalization of the pulmonary vasculature, without frank pulmonary edema. Heart size appears borderline enlarged. Upper mediastinal contours are within normal limits. IMPRESSION: 1. Heart size appears borderline enlarged and there is some cephalization of the pulmonary vasculature, but no frank pulmonary edema. Electronically Signed   By: Trudie Reed M.D.   On: 01/21/2020 17:44   DG Foot Complete Right  Result Date: 01/21/2020 CLINICAL DATA:  Right foot ulcer. EXAM: RIGHT FOOT COMPLETE - 3+ VIEW COMPARISON:  None. FINDINGS: Acute fracture deformity is seen extending through the  distal aspect of the fifth left metatarsal. There is no evidence of dislocation. There is no evidence of arthropathy or other focal bone abnormality. A 2.4 cm area of soft tissue ulceration is seen along the lateral aspect of the distal right foot. This is adjacent to the previous described fracture deformity. Moderate severity diffuse soft tissue swelling is seen. IMPRESSION: 1. Acute fracture of the fifth left metatarsal with an adjacent area of soft tissue ulceration. 2. Acute osteomyelitis cannot be excluded. MRI correlation is recommended. Electronically Signed   By: Aram Candela M.D.   On: 01/21/2020 19:44    Patient benefited maximally from their hospital stay and there were no complications.     Disposition: Discharge disposition: 01-Home or Self Care      Discharge Instructions    Call MD / Call 911   Complete by: As directed    If you experience chest pain or shortness of breath, CALL 911 and be transported to the hospital emergency room.  If you develope a fever above 101 F, pus (white drainage) or increased drainage or redness at the wound, or calf pain, call your surgeon's office.   Constipation Prevention   Complete by: As directed    Drink plenty of fluids.  Prune juice may be helpful.  You may use a stool softener, such as Colace (over the counter) 100 mg twice a day.  Use MiraLax (over the counter) for constipation as needed.   Diet - low sodium heart healthy   Complete by: As  directed    Discharge instructions   Complete by: As directed    Elevate Right Leg. Daily dry dressing change . May cleanse with mild soap and water but do not soak foot   Increase activity slowly as tolerated   Complete by: As directed      Follow-up Information    [facility] Follow up.   Why: [facility] will deliver your rolling walker and 3:1 commode to your room prior to discharge to home. Contact information: [address] [place]  [address] [phone]        [facility] Follow up.   Specialty: Home Health Services Why: [facility] will be providing physical therapy and occupational therapy.  You should receive a call within 24-48 hours of your discharge home.           Signed: [place] 02/03/2020, 7:27 AM

## 2020-02-04 LAB — BASIC METABOLIC PANEL
Anion gap: 10 (ref 5–15)
BUN: 21 mg/dL — ABNORMAL HIGH (ref 6–20)
CO2: 23 mmol/L (ref 22–32)
Calcium: 8.9 mg/dL (ref 8.9–10.3)
Chloride: 103 mmol/L (ref 98–111)
Creatinine, Ser: 2.71 mg/dL — ABNORMAL HIGH (ref 0.44–1.00)
GFR calc Af Amer: 22 mL/min — ABNORMAL LOW (ref >60)
GFR calc non Af Amer: 19 mL/min — ABNORMAL LOW (ref >60)
Glucose, Bld: 171 mg/dL — ABNORMAL HIGH (ref 70–99)
Potassium: 4.9 mmol/L (ref 3.5–5.1)
Sodium: 136 mmol/L (ref 135–145)

## 2020-02-04 LAB — MICROALBUMIN / CREATININE URINE RATIO
Creatinine, Urine: 106.4 mg/dL
Microalb Creat Ratio: 9 mg/g creat (ref 0–29)
Microalb, Ur: 9.3 ug/mL — ABNORMAL HIGH

## 2020-02-04 LAB — GLUCOSE, CAPILLARY
Glucose-Capillary: 210 mg/dL — ABNORMAL HIGH (ref 70–99)
Glucose-Capillary: 237 mg/dL — ABNORMAL HIGH (ref 70–99)

## 2020-02-04 MED ORDER — GABAPENTIN 100 MG PO CAPS: 100.0000 mg | ORAL_CAPSULE | Freq: Three times a day (TID) | ORAL | Status: DC

## 2020-02-04 MED ORDER — FUROSEMIDE 20 MG PO TABS
10.0000 mg | ORAL_TABLET | Freq: Every day | ORAL | 3 refills | Status: DC
Start: 2020-02-04 — End: 2021-06-24

## 2020-02-04 NOTE — Progress Notes (Signed)
Discharge instructions addressed; Pt in stable condition;Husband in room and will be pt's ride home. 

## 2020-02-04 NOTE — Progress Notes (Signed)
[facility] Progress Note   Subjective:   Feeling better - tolerated more PO intake this AM and feels comfortable with d/c  Objective Vitals:   02/03/20 0745 02/03/20 1457 02/03/20 1957 02/04/20 0445  BP: (!) 153/66 125/61 111/60 (!) 109/58  Pulse: 93 91 (!) 103 100  Resp: 14 16 16 18   Temp: 98.6 F (37 C) 98.2 F (36.8 C) 98.7 F (37.1 C) 98.1 F (36.7 C)  TempSrc: Oral  Oral Oral  SpO2: 90% 96% 95% 94%  Weight:      Height:       Physical Exam General: obese, comfortable Heart: no rub Lungs: normal WOB Abdomen: soft, obese Extremities: trace dependent edema, RLE bandaged   Additional Objective Labs: Basic Metabolic Panel: Recent Labs  Lab 02/02/20 0232 02/03/20 0608 02/04/20 0432  NA 137 135 136  K 4.5 5.1 4.9  CL 99 97* 103  CO2 25 22 23   GLUCOSE 91 163* 171*  BUN 14 21* 21*  CREATININE 2.91* 3.37* 2.71*  CALCIUM 8.6* 9.0 8.9   Liver Function Tests: No results for input(s): AST, ALT, ALKPHOS, BILITOT, PROT, ALBUMIN in the last 168 hours. No results for input(s): LIPASE, AMYLASE in the last 168 hours. CBC: Recent Labs  Lab 01/31/20 0308  WBC 9.1  HGB 10.1*  HCT 34.6*  MCV 84.2  PLT 340   Blood Culture    Component Value Date/Time   SDES TISSUE 01/28/2020 1322   SPECREQUEST RIGHT 4TH AND 5TH TOE AMPUTATION 01/28/2020 1322   CULT  01/28/2020 1322    FEW PROTEUS MIRABILIS FEW STAPHYLOCOCCUS AUREUS FEW ENTEROCOCCUS FAECALIS ABUNDANT BACTEROIDES SPECIES BETA LACTAMASE NEGATIVE Performed at [facility], [place] [address], [place], [place] [address]    REPTSTATUS 02/03/2020 FINAL 01/28/2020 1322    Cardiac Enzymes: No results for input(s): CKTOTAL, CKMB, CKMBINDEX, TROPONINI in the last 168 hours. CBG: Recent Labs  Lab 02/03/20 0742 02/03/20 1121 02/03/20 1623 02/03/20 2026 02/04/20 0736  GLUCAP 177* 199* 236* 187* 210*   Iron Studies: No results for input(s): IRON, TIBC, TRANSFERRIN, FERRITIN in the last 72  hours. @lablastinr3 @ Studies/Results: US RENAL  Result Date: 02/03/2020 CLINICAL DATA:  Acute renal insufficiency EXAM: RENAL / URINARY TRACT ULTRASOUND COMPLETE COMPARISON:  10/17/2018 FINDINGS: Right Kidney: Renal measurements: 12.7 x 6.4 by 6.6 cm = volume: 280 mL . Echogenicity within normal limits. No mass or hydronephrosis visualized. Left Kidney: Renal measurements: 12.7 x 6.2 x 5.8 cm = volume: To under 39 mL. Echogenicity within normal limits. No mass or hydronephrosis visualized. Bladder: Bladder is decompressed limiting evaluation. Other: None. IMPRESSION: 1. Unremarkable renal ultrasound. Electronically Signed   By: Randa Ngo M.D.   On: 02/03/2020 15:19   Medications:  . aspirin EC  81 mg Oral Daily  . ciprofloxacin  500 mg Oral Daily  . clopidogrel  75 mg Oral Daily  . diltiazem  120 mg Oral Daily  . docusate sodium  100 mg Oral BID  . gabapentin  100 mg Oral TID  . hydroxychloroquine  200 mg Oral BID  . insulin regular human CONCENTRATED  10-65 Units Subcutaneous QHS  . insulin regular human CONCENTRATED  15-95 Units Subcutaneous BID WC  . isosorbide mononitrate  30 mg Oral Daily  . levothyroxine  200 mcg Oral QAC breakfast  . pantoprazole  40 mg Oral Daily    Assessment/Plan **AKI:  Suspect it's multifactorial with hypovolemia and hypotension + ACEi.  No e/o autoimmune issue here or AIN with bland UA.  Renal US normal.  Her renal function has improved with 12h of hydration and her po intake has picked up.  I think it's fine to discharge her today and have her f/u with PCP for a recheck in a week; I think this will normalize.  She was taking lasix 10 BID at home prior to this and I'd like that dose resumed; she's saavy and can adjust it.  Her BP remains on the low side still so d/c OFF enalapril, diltiazem and isosorbide. Watch gabapentin dose on discharge - reduce for renal function, no more than 100 TID  **HTN:  Modest hypotension, hold meds on d/c and f/u with PCP in  1 week to resume.   **DM:  Per primary  **MCTD/RA: follows with rheumatology, on plaquenil.  Well controlled symptoms. No e/o renal involvement.   Ok to d/c. She should see pcp in 1-2 weeks for BP check, BMP to check kidneys.  I think she'll return to normal renal function.  If not I can f/u with her in nephrology clinic.  She has my card.   Estill Bakes MD 02/04/2020, 8:45 AM  [facility] Pager: [phone]

## 2020-02-04 NOTE — Progress Notes (Signed)
Occupational Therapy Treatment Patient Details Name: [name] MRN: [id number] DOB: 1960-12-24 Today's Date: 02/04/2020    History of present illness Pt is a 59 yo female presenting s/p amputation of right 4th and 5th ray on 5/19 due to RLE abcess and osteomyelitis. PMH includes: asthma, CHF, chronic pain, CAD, gout, HLD, HTN, Mixed Connective Tissue Disease, obesity, and DM II.   OT comments  Pt in bed upon arrival, she required supervision to progress to EOB. While sitting EOB she reported nausea and dizziness. BP 138/69 Hr 99bpm;notified RN who arrived to provide pt with medication. Pt required minA for standpivot transfer to recliner. She verbalized she feels more confident with transfers and is comfortable discharging home with assistance from her husband. Pt will continue to benefit from skilled OT services to maximize safety and independence with ADL/IADL and functional mobility. Will continue to follow acutely and progress as tolerated.    Follow Up Recommendations  Home health OT;Supervision/Assistance - 24 hour    Equipment Recommendations  Other (comment)(TBD)    Recommendations for Other Services      Precautions / Restrictions Precautions Precautions: Fall Restrictions Weight Bearing Restrictions: Yes RLE Weight Bearing: Non weight bearing       Mobility Bed Mobility Overal bed mobility: Needs Assistance Bed Mobility: Supine to Sit     Supine to sit: Supervision;HOB elevated     General bed mobility comments: progresed to EOB with HOB elevated otherwise she did not have to use rails  Transfers Overall transfer level: Needs assistance Equipment used: Rolling walker (2 wheeled)(youth height) Transfers: Sit to/from [facility] Sit to Stand: Min assist Stand pivot transfers: Min assist       General transfer comment: minA for safety and stability;pt did not required vc for WB precautions    Balance Overall balance assessment: Needs  assistance Sitting-balance support: Feet supported Sitting balance-[name]: Good     Standing balance support: Bilateral upper extremity supported;During functional activity Standing balance-[name]: Poor Standing balance comment: heavy reliance on UE support                           ADL either performed or assessed with clinical judgement   ADL Overall ADL's : Needs assistance/impaired                         Toilet Transfer: Minimal assistance;Stand-pivot;RW Toilet Transfer Details (indicate cue type and reason): stand-pivot to recliner;pt maintaining NWB status Toileting- Clothing Manipulation and Hygiene: Total assistance;Sit to/from stand Toileting - Clothing Manipulation Details (indicate cue type and reason): continued to educate pt on use of AE      Functional mobility during ADLs: Minimal assistance;Rolling walker General ADL Comments: pt with increased confidence with caring for herself and with transfers     Vision       Perception     Praxis      Cognition Arousal/Alertness: Awake/alert Behavior During Therapy: [facility] for tasks assessed/performed;Anxious Overall Cognitive Status: Within Functional Limits for tasks assessed                                 General Comments: pt verbalized she felt more confident with transfers and feels confident with d/cing home        Exercises     Shoulder Instructions       General Comments pt nauseous during  session, RN notified arrive to provide pt with nausea medication: BP 138/69 HR 99    Pertinent Vitals/ Pain       Pain Assessment: Faces Faces Pain Scale: Hurts little more Pain Location: back Pain Descriptors / Indicators: Sore Pain Intervention(s): Monitored during session;Limited activity within patient's tolerance  Home Living                                          Prior Functioning/Environment              Frequency  Min 2X/week         Progress Toward Goals  OT Goals(current goals can now be found in the care plan section)  Progress towards OT goals: Progressing toward goals  Acute Rehab OT Goals Patient Stated Goal: return home without need for WC OT Goal Formulation: With patient Time For Goal Achievement: 02/12/20 Potential to Achieve Goals: Good ADL Goals Pt Will Perform Lower Body Dressing: with min assist;sitting/lateral leans;sit to/from stand;with adaptive equipment Pt Will Transfer to Toilet: with min assist;bedside commode;stand pivot transfer(walker) Pt Will Perform Toileting - Clothing Manipulation and hygiene: with min assist;sitting/lateral leans;sit to/from stand Pt/caregiver will Perform Home Exercise Program: Increased strength;Both right and left upper extremity;Independently;With theraband  Plan Discharge plan needs to be updated    Co-evaluation                 AM-PAC OT "6 Clicks" Daily Activity     Outcome Measure   Help from another person eating meals?: A Little Help from another person taking care of personal grooming?: A Little Help from another person toileting, which includes using toliet, bedpan, or urinal?: A Lot Help from another person bathing (including washing, rinsing, drying)?: A Lot Help from another person to put on and taking off regular upper body clothing?: A Little Help from another person to put on and taking off regular lower body clothing?: A Lot 6 Click Score: 15    End of Session Equipment Utilized During Treatment: Rolling walker;Gait belt  OT Visit Diagnosis: Unsteadiness on feet (R26.81);Other abnormalities of gait and mobility (R26.89);Pain Pain - Right/Left: Right Pain - part of body: (back)   Activity Tolerance Patient tolerated treatment well   Patient Left with call bell/phone within reach;with nursing/sitter in room;in chair;with chair alarm set(seated EOB)   Nurse Communication Mobility status;Patient requests pain meds;Weight  bearing status        Time: 3557-3220 OT Time Calculation (min): 21 min  Charges: OT General Charges $OT Visit: 1 Visit OT Treatments $Self Care/Home Management : 8-22 mins  Rosey Bath OTR/L Acute Rehabilitation Services Office: [phone]    Rebeca Alert 02/04/2020, 12:13 PM

## 2020-02-04 NOTE — Discharge Summary (Signed)
Discharge Diagnoses:  Active Problems:   Cutaneous abscess of right foot   Subacute osteomyelitis, right ankle and foot (HCC)   Acute hematogenous osteomyelitis of right foot (HCC)   Surgeries: Procedure(s): RIGHT FOURTH AND FIFTH RAY AMPUTATION, on 01/28/2020    Consultants:   Discharged Condition: Improved  Hospital Course: [name] is an 59 y.o. female who was admitted 01/28/2020 with a chief complaint of Right Foot abscess, with a final diagnosis of Abscess and Osteomyelitis Right Foot.  Patient was brought to the operating room on 01/28/2020 and underwent Procedure(s): RIGHT FOURTH AND FIFTH RAY AMPUTATION,.    Patient was given perioperative antibiotics:  Anti-infectives (From admission, onward)   Start     Dose/Rate Route Frequency Ordered Stop   02/03/20 1000  ciprofloxacin (CIPRO) tablet 500 mg     500 mg Oral Daily 02/03/20 0746     02/03/20 0800  ciprofloxacin (CIPRO) tablet 500 mg  Status:  Discontinued     500 mg Oral 2 times daily 02/03/20 0714 02/03/20 0745   02/03/20 0800  ciprofloxacin (CIPRO) tablet 500 mg  Status:  Discontinued     500 mg Oral Daily with breakfast 02/03/20 0745 02/03/20 0746   02/03/20 0000  fluconazole (DIFLUCAN) 150 MG tablet        02/03/20 0725     02/03/20 0000  ciprofloxacin (CIPRO) 500 MG tablet  Status:  Discontinued     500 mg Oral 2 times daily 02/03/20 0725 02/03/20    02/03/20 0000  ciprofloxacin (CIPRO) 250 MG tablet     250 mg Oral 2 times daily 02/03/20 0748     02/01/20 1300  amoxicillin-clavulanate (AUGMENTIN) 875-125 MG per tablet 1 tablet  Status:  Discontinued     1 tablet Oral Every 12 hours 02/01/20 1236 02/03/20 0714   01/29/20 1000  vancomycin (VANCOCIN) IVPB 1000 mg/200 mL premix  Status:  Discontinued     1,000 mg 200 mL/hr over 60 Minutes Intravenous Every 12 hours 01/29/20 0938 02/01/20 1236   01/28/20 2200  hydroxychloroquine (PLAQUENIL) tablet 200 mg     200 mg Oral 2 times daily 01/28/20 1538     01/28/20  1600  vancomycin (VANCOREADY) IVPB 2000 mg/400 mL     2,000 mg 200 mL/hr over 120 Minutes Intravenous  Once 01/28/20 1551 01/28/20 1932   01/28/20 1600  piperacillin-tazobactam (ZOSYN) IVPB 3.375 g  Status:  Discontinued     3.375 g 12.5 mL/hr over 240 Minutes Intravenous Every 8 hours 01/28/20 1551 02/01/20 1236   01/28/20 0600  ceFAZolin (ANCEF) 3 g in dextrose 5 % 50 mL IVPB     3 g 100 mL/hr over 30 Minutes Intravenous On call to O.R. 01/27/20 1047 01/28/20 1306    .  Patient was given sequential compression devices, early ambulation, and aspirin for DVT prophylaxis.  Recent vital signs:  Patient Vitals for the past 24 hrs:  BP Temp Temp src Pulse Resp SpO2  02/04/20 0445 (!) 109/58 98.1 F (36.7 C) Oral 100 18 94 %  02/03/20 1957 111/60 98.7 F (37.1 C) Oral (!) 103 16 95 %  02/03/20 1457 125/61 98.2 F (36.8 C) - 91 16 96 %  .  Recent laboratory studies: US RENAL  Result Date: 02/03/2020 CLINICAL DATA:  Acute renal insufficiency EXAM: RENAL / URINARY TRACT ULTRASOUND COMPLETE COMPARISON:  10/17/2018 FINDINGS: Right Kidney: Renal measurements: 12.7 x 6.4 by 6.6 cm = volume: 280 mL . Echogenicity within normal limits. No mass or hydronephrosis visualized.  Left Kidney: Renal measurements: 12.7 x 6.2 x 5.8 cm = volume: To under 39 mL. Echogenicity within normal limits. No mass or hydronephrosis visualized. Bladder: Bladder is decompressed limiting evaluation. Other: None. IMPRESSION: 1. Unremarkable renal ultrasound. Electronically Signed   By: Sharlet Salina M.D.   On: 02/03/2020 15:19    Discharge Medications:   Allergies as of 02/04/2020      Reactions   Fish Allergy Anaphylaxis   INCLUDES OMEGA 3 OILS   Fish Oil Anaphylaxis, Swelling   THROAT SWELLS INCLUDES FISH AS A CLASS   Omega-3 Fatty Acids Swelling   Throat swelling    Crestor [rosuvastatin] Other (See Comments)   Extreme joint pain, to this & other statins   Gabapentin Anxiety   Anxiety on high doses    Lovastatin Other (See Comments)   EXTREME JOINT PAIN   Metronidazole Nausea Only, Swelling   Headache and shakes Nausea and vomiting   Red Yeast Rice [cholestin] Other (See Comments)   Muscle pain and severe joint pain.    Rotigotine Swelling   Extreme edema   Atrovent Hfa [ipratropium Bromide Hfa] Other (See Comments)   wheezing   Red Yeast Rice Extract    Joint pain   Victoza [liraglutide]    Unsure of reaction    Suprep [na Sulfate-k Sulfate-mg Sulf] Nausea And Vomiting   Tape Other (See Comments)   Electrodes causes skin breakdown      Medication List    STOP taking these medications   clindamycin 300 MG capsule Commonly known as: CLEOCIN   diltiazem 300 MG 24 hr capsule Commonly known as: Cardizem CD   enalapril 10 MG tablet Commonly known as: VASOTEC   ipratropium-albuterol 0.5-2.5 (3) MG/3ML Soln Commonly known as: DUONEB   liraglutide 18 MG/3ML Sopn Commonly known as: VICTOZA   mupirocin ointment 2 % Commonly known as: BACTROBAN   promethazine-dextromethorphan 6.25-15 MG/5ML syrup Commonly known as: PROMETHAZINE-DM     TAKE these medications   acetaminophen 650 MG CR tablet Commonly known as: TYLENOL Take 1,300 mg by mouth in the morning and at bedtime.   aspirin EC 81 MG tablet Take 81 mg by mouth daily.   augmented betamethasone dipropionate 0.05 % cream Commonly known as: DIPROLENE-AF APPLY 1 CREAM TOPICALLY ONCE DAILY AS NEEDED FOR  ECZEMA   Bayer Contour Test test strip Generic drug: glucose blood 1 each 4 (four) times daily.   Breo Ellipta 200-25 MCG/INH Aepb Generic drug: fluticasone furoate-vilanterol Inhale 1 puff by mouth once daily   ciprofloxacin 250 MG tablet Commonly known as: CIPRO Take 1 tablet (250 mg total) by mouth 2 (two) times daily.   clopidogrel 75 MG tablet Commonly known as: PLAVIX Take 1 tablet by mouth once daily with breakfast What changed: See the new instructions.   clotrimazole-betamethasone  cream Commonly known as: LOTRISONE Apply 1 application topically 2 (two) times daily as needed (eczema).   diphenhydrAMINE 25 MG tablet Commonly known as: BENADRYL Take 25 mg by mouth daily as needed for allergies.   EPINEPHrine 0.3 mg/0.3 mL Soaj injection Commonly known as: EpiPen 2-Pak USE AS DIRECTED FOR SEVERE ALLERGIC REACTION. What changed:   how much to take  how to take this  when to take this  reasons to take this  additional instructions   fluconazole 150 MG tablet Commonly known as: DIFLUCAN Take 1 tab, repeat in 48 hours if no improvement. What changed:   how much to take  how to take this  when to take  this  reasons to take this  additional instructions  Another medication with the same name was removed. Continue taking this medication, and follow the directions you see here.   fluticasone 50 MCG/ACT nasal spray Commonly known as: FLONASE USE 2 SPRAY(S) IN EACH NOSTRIL ONCE DAILY AS NEEDED FOR  ALLERGIES  OR  RHINITIS What changed: See the new instructions.   furosemide 20 MG tablet Commonly known as: Lasix Take 0.5 tablets (10 mg total) by mouth daily. As needed for swelling What changed:   medication strength  how much to take  when to take this  additional instructions   gabapentin 100 MG capsule Commonly known as: NEURONTIN Take 1 capsule (100 mg total) by mouth 3 (three) times daily. Pt takes  in the morning,  at lunchtime and  in the evening What changed:   how much to take  when to take this   guaiFENesin-dextromethorphan 100-10 MG/5ML syrup Commonly known as: ROBITUSSIN DM Take 5 mLs by mouth every 4 (four) hours as needed for cough.   HUMULIN R 500 UNIT/ML injection Generic drug: insulin regular human CONCENTRATED Inject 40-150 Units into the skin 3 (three) times daily with meals.   hydroxychloroquine 200 MG tablet Commonly known as: PLAQUENIL Take 200 mg by mouth 2 (two) times daily.   isosorbide  mononitrate 30 MG 24 hr tablet Commonly known as: IMDUR Take 1 tablet (30 mg total) by mouth daily.   levocetirizine 5 MG tablet Commonly known as: XYZAL TAKE 1 TABLET BY MOUTH ONCE DAILY IN THE EVENING   levothyroxine 200 MCG tablet Commonly known as: SYNTHROID Take 200 mcg by mouth daily before breakfast.   magnesium oxide 400 MG tablet Commonly known as: MAG-OX Take 400-1,600 mg by mouth 2 (two) times daily as needed (cramps).   metFORMIN 500 MG 24 hr tablet Commonly known as: GLUCOPHAGE-XR Take 2 tablets (1,000 mg total) by mouth 2 (two) times daily.   Mucinex Maximum Strength 1200 MG Tb12 Generic drug: Guaifenesin Take 1,200 mg by mouth 2 (two) times daily.   NEO-SYNEPHRINE NA Place 1 spray into both nostrils daily as needed (congestion).   nitroGLYCERIN 0.4 MG SL tablet Commonly known as: NITROSTAT DISSOLVE ONE TABLET UNDER THE TONGUE EVERY 5 MINUTES AS NEEDED FOR CHEST PAIN.  DO NOT EXCEED A TOTAL OF 3 DOSES IN 15 MINUTES What changed: See the new instructions.   ondansetron 4 MG tablet Commonly known as: ZOFRAN Take 8 mg by mouth every 6 (six) hours as needed for nausea or vomiting.   Oxycodone HCl 10 MG Tabs Take 10 mg by mouth every 6 (six) hours as needed (pain).   oxymetazoline 0.05 % nasal spray Commonly known as: AFRIN Place 1 spray into both nostrils 2 (two) times daily as needed for congestion.   pantoprazole 40 MG tablet Commonly known as: PROTONIX Take 1 tablet by mouth once daily   potassium chloride 10 MEQ tablet Commonly known as: KLOR-CON Take 1 tab for every 20 mg of Lasix taken. What changed:   how much to take  how to take this  when to take this  additional instructions   pramipexole 0.5 MG tablet Commonly known as: MIRAPEX Take 0.5 mg by mouth See admin instructions. Take 0.5 mg up to 6 times daily as needed for restless legs What changed: Another medication with the same name was removed. Continue taking this medication, and  follow the directions you see here.   ProAir HFA 108 (90 Base) MCG/ACT inhaler Generic drug: albuterol  INHALE 2 PUFFS BY MOUTH EVERY 4 HOURS AS NEEDED FOR WHEEZING AND FOR SHORTNESS OF BREATH What changed: See the new instructions.   albuterol (2.5 MG/3ML) 0.083% nebulizer solution Commonly known as: PROVENTIL Take 3 mLs (2.5 mg total) by nebulization every 6 (six) hours as needed for wheezing or shortness of breath. What changed:   how much to take  when to take this   promethazine 25 MG tablet Commonly known as: PHENERGAN Take 1 tablet (25 mg total) by mouth every 6 (six) hours as needed for nausea or vomiting. What changed: See the new instructions.   saccharomyces boulardii 250 MG capsule Commonly known as: FLORASTOR Take 250 mg by mouth 3 (three) times daily.   SYSTANE ULTRA OP Place 2 drops into both eyes daily as needed (dry eyes).   umeclidinium bromide 62.5 MCG/INH Aepb Commonly known as: INCRUSE ELLIPTA Inhale 1 puff into the lungs daily.   Vitamin D (Ergocalciferol) 1.25 MG (50000 UNIT) Caps capsule Commonly known as: DRISDOL Take 1 capsule (50,000 Units total) by mouth every 7 (seven) days. 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 Echogenicity within normal limits. No mass or hydronephrosis visualized. Left Kidney: Renal measurements: 12.7 x 6.2 x 5.8 cm = volume: To under 39 mL. Echogenicity within normal limits. No mass or hydronephrosis visualized. Bladder: Bladder is decompressed limiting evaluation. Other: None. IMPRESSION: 1. Unremarkable renal ultrasound. Electronically Signed   By: Sharlet Salina M.D.   On: 02/03/2020 15:19   DG Chest Portable 1 View  Result Date: 01/21/2020 CLINICAL DATA:  59 year old female with history of shortness of breath for the past 5 weeks. EXAM: PORTABLE CHEST 1 VIEW COMPARISON:  Chest x-ray 08/01/2019. FINDINGS: Film is under penetrated limiting the diagnostic sensitivity and specificity of the examination. With these limitations in mind, there is no definite acute consolidative airspace disease and no pleural effusions. No pneumothorax. Azygos lobe (normal anatomical variant) incidentally noted. Cephalization of the pulmonary vasculature, without frank pulmonary edema. Heart size appears borderline enlarged. Upper mediastinal contours are within normal limits. IMPRESSION: 1. Heart size appears borderline enlarged and there is some cephalization of the pulmonary vasculature, but no frank pulmonary edema. Electronically Signed   By: Trudie Reed M.D.   On: 01/21/2020 17:44   DG Foot Complete Right  Result Date: 01/21/2020 CLINICAL DATA:  Right foot ulcer. EXAM: RIGHT FOOT COMPLETE - 3+ VIEW COMPARISON:  None. FINDINGS: Acute  fracture deformity is seen extending through the distal aspect of the fifth left metatarsal. There is no evidence of dislocation. There is no evidence of arthropathy or other focal bone abnormality. A 2.4 cm area of soft tissue ulceration is seen along the lateral aspect of the distal right foot. This is adjacent to the previous described fracture deformity. Moderate  severity diffuse soft tissue swelling is seen. IMPRESSION: 1. Acute fracture of the fifth left metatarsal with an adjacent area of soft tissue ulceration. 2. Acute osteomyelitis cannot be excluded. MRI correlation is recommended. Electronically Signed   By: Aram Candela M.D.   On: 01/21/2020 19:44    Patient benefited maximally from their hospital stay and there were no complications.     Disposition: Discharge disposition: 01-Home or Self Care      Discharge Instructions    Call MD / Call 911   Complete by: As directed    If you experience chest pain or shortness of breath, CALL 911 and be transported to the hospital emergency room.  If you develope a fever above 101 F, pus (white drainage) or increased drainage or redness at the wound, or calf pain, call your surgeon's office.   Call MD / Call 911   Complete by: As directed    If you experience chest pain or shortness of breath, CALL 911 and be transported to the hospital emergency room.  If you develope a fever above 101 F, pus (white drainage) or increased drainage or redness at the wound, or calf pain, call your surgeon's office.   Constipation Prevention   Complete by: As directed    Drink plenty of fluids.  Prune juice may be helpful.  You may use a stool softener, such as Colace (over the counter) 100 mg twice a day.  Use MiraLax (over the counter) for constipation as needed.   Constipation Prevention   Complete by: As directed    Drink plenty of fluids.  Prune juice may be helpful.  You may use a stool softener, such as Colace (over the counter) 100 mg twice a day.  Use MiraLax (over the counter) for constipation as needed.   Diet - low sodium heart healthy   Complete by: As directed    Diet - low sodium heart healthy   Complete by: As directed    Discharge instructions   Complete by: As directed    Elevate Right Leg. Daily dry dressing change . May cleanse with mild soap and water but do not soak foot   Discharge  instructions   Complete by: As directed    Elevate Foot. Nonweightbearing. May wash wound with mild soap and water . Do not soak   Increase activity slowly as tolerated   Complete by: As directed    Increase activity slowly as tolerated   Complete by: As directed      Follow-up Information    [facility] Follow up.   Why: [facility] will deliver your rolling walker and 3:1 commode to your room prior to discharge to home. Contact information: [address] [address] [phone]        [facility] Follow up.   Specialty: Home Health Services Why: [facility] will be providing physical therapy and occupational therapy.  You should receive a call within 24-48 hours of your discharge home.       Adonis Huguenin, NP Follow up.   Specialty: Orthopedic Surgery Contact information: [phone] [place]  [address] [place] [address] [phone]        Shelda Pal, DO Follow up in 1 week(s).   Specialty: Family Medicine Contact information: [address] [place] [address] [phone]            Signed: Bevely Palmer Persons 02/04/2020, 8:50 AM

## 2020-02-04 NOTE — Progress Notes (Signed)
Patient is postop day 8 status post fifth ray amputation.  She was scheduled to be discharged today but had a significant increase in her creatinine.  Was seen and evaluated by nephrology and appreciate their input.  This morning BMP demonstrates a creatinine of 2.71 significantly improved.  Still not in the normal range.  BUN is 21.  Patient understands that I will await recommendations from nephrology today with regards if I can discharge her today or not.  She asked that I remove the dressing and examine the wound.  Dressing was removed she does have some skin maceration and minimal bloody drainage.  No frank necrotic areas.  Swelling is well controlled sutures in place without any dehiscence.  We will continue to follow this as an outpatient.  Will discharge today or tomorrow pending nephrology recommendations

## 2020-02-04 NOTE — Progress Notes (Signed)
Physical Therapy Treatment Patient Details Name: [name] MRN: [id number] DOB: 15-Jul-1961 Today's Date: 02/04/2020    History of Present Illness Pt is a 59 yo female presenting s/p amputation of right 4th and 5th ray on 5/19 due to RLE abcess and osteomyelitis. PMH includes: asthma, CHF, chronic pain, CAD, gout, HLD, HTN, Mixed Connective Tissue Disease, obesity, and DM II.    PT Comments    Pt seated in recliner on arrival  Focused on LE strengthening in seated and reclined/supine position.  Pt reports feeling woozy in standing from IV pain meds so further gt deferred.   Pt choosing to d/c home despite d/c recs and reports she has ramped entrance into her home.     Follow Up Recommendations  SNF;Supervision/Assistance - 24 hour     Equipment Recommendations  Rolling walker with 5" wheels(youth height)    Recommendations for Other Services       Precautions / Restrictions Precautions Precautions: Fall Precaution Comments: wound vac RLE Restrictions Weight Bearing Restrictions: Yes RLE Weight Bearing: Non weight bearing    Mobility  Bed Mobility               General bed mobility comments: Pt seated in recliner on arrival.  Transfers Overall transfer level: Needs assistance Equipment used: Rolling walker (2 wheeled)(YOUTH HEIGHT) Transfers: Sit to/from Stand Sit to Stand: Min assist Stand pivot transfers: Min assist       General transfer comment: minA for safety and stability;pt did not required vc for WB precautions  Ambulation/Gait Ambulation/Gait assistance: (unable to progress gt as patient hysr recieved IV pain meds and reports feeling woozy in standing.)               Stairs             Wheelchair Mobility    Modified Rankin (Stroke Patients Only)       Balance     Sitting balance-[name]: Good       Standing balance-[name]: Poor                              Cognition Arousal/Alertness:  Awake/alert Behavior During Therapy: WFL for tasks assessed/performed;Anxious Overall Cognitive Status: Within Functional Limits for tasks assessed                                 General Comments: pt verbalized she felt more confident with transfers and feels confident with d/cing home      Exercises General Exercises - Lower Extremity Ankle Circles/Pumps: AROM;Both;10 reps;Supine Quad Sets: AROM;Both;10 reps;Supine Long Arc Quad: AROM;Both;10 reps;Seated Hip ABduction/ADduction: AROM;Both;10 reps;Seated(via pillow squeeze) Hip Flexion/Marching: AROM;Both;10 reps;Seated    General Comments        Pertinent Vitals/Pain Pain Assessment: Faces Pain Score: 7  Pain Location: back Pain Descriptors / Indicators: Sore Pain Intervention(s): Monitored during session    Home Living                      Prior Function            PT Goals (current goals can now be found in the care plan section) Acute Rehab PT Goals Patient Stated Goal: return home without need for WC Potential to Achieve Goals: Fair Progress towards PT goals: Progressing toward goals    Frequency    Min 3X/week  PT Plan Current plan remains appropriate    Co-evaluation              AM-PAC PT "6 Clicks" Mobility   Outcome Measure    Help needed moving from lying on your back to sitting on the side of a flat bed without using bedrails?: A Little Help needed moving to and from a bed to a chair (including a wheelchair)?: A Little Help needed standing up from a chair using your arms (e.g., wheelchair or bedside chair)?: A Little Help needed to walk in hospital room?: A Little Help needed climbing 3-5 steps with a railing? : Total 6 Click Score: 13    End of Session Equipment Utilized During Treatment: Gait belt Activity Tolerance: Patient tolerated treatment well Patient left: in chair;with call bell/phone within reach;with chair alarm set Nurse Communication:  Mobility status PT Visit Diagnosis: Difficulty in walking, not elsewhere classified (R26.2);Muscle weakness (generalized) (M62.81);Pain Pain - Right/Left: Right Pain - part of body: Ankle and joints of foot     Time: 0254-2706 PT Time Calculation (min) (ACUTE ONLY): 13 min  Charges:  $Therapeutic Exercise: 8-22 mins                     Erasmo Leventhal , PTA Acute Rehabilitation Services Pager [phone] Office [phone]     Aimee Eli Hose 02/04/2020, 4:42 PM

## 2020-02-04 NOTE — Plan of Care (Signed)
  Problem: Education: Goal: Knowledge of General Education information will improve Description: Including pain rating scale, medication(s)/side effects and non-pharmacologic comfort measures Outcome: Progressing   Problem: Activity: Goal: Risk for activity intolerance will decrease Outcome: Progressing   Problem: Skin Integrity: Goal: Risk for impaired skin integrity will decrease Outcome: Progressing   

## 2020-02-05 ENCOUNTER — Telehealth: Payer: Self-pay | Admitting: *Deleted

## 2020-02-05 ENCOUNTER — Telehealth: Payer: Self-pay

## 2020-02-05 NOTE — Telephone Encounter (Signed)
Pt is s/p a 4th and 5th ray amputation called to advise verbal ok per Dr. Lajoyce Corners for orders below. To call with questions.

## 2020-02-05 NOTE — Telephone Encounter (Signed)
1st attempt. Unable to reach patient. Voice mailbox full.

## 2020-02-05 NOTE — Telephone Encounter (Signed)
Physical therapist with [facility] would like orders for HHPT, 1 x wk for 1 week, 2 x wk for 6 weeks, and 1 x wk for 1 week and to start [facility] aid next week 2 x week for 4 weeks.  Cb# [phone].  Please advise.  Thank you.

## 2020-02-06 NOTE — Telephone Encounter (Signed)
I have made two attempts and have been unable to reach patient. Pt has hospital follow up scheduled w/ PCP 02/11/20.

## 2020-02-10 ENCOUNTER — Telehealth: Payer: Self-pay | Admitting: Orthopedic Surgery

## 2020-02-10 NOTE — Telephone Encounter (Signed)
[facility] called.  They are requesting a call back to obtain authorization for the patient   Call back: [phone]  Reference number: [id number]

## 2020-02-11 ENCOUNTER — Telehealth: Payer: Self-pay

## 2020-02-11 ENCOUNTER — Ambulatory Visit (INDEPENDENT_AMBULATORY_CARE_PROVIDER_SITE_OTHER): Payer: 59 | Admitting: Family

## 2020-02-11 ENCOUNTER — Ambulatory Visit: Payer: 59 | Admitting: Family Medicine

## 2020-02-11 ENCOUNTER — Other Ambulatory Visit: Payer: Self-pay

## 2020-02-11 ENCOUNTER — Encounter: Payer: Self-pay | Admitting: Family Medicine

## 2020-02-11 ENCOUNTER — Encounter: Payer: Self-pay | Admitting: Family

## 2020-02-11 VITALS — BP 132/82 | HR 77 | Temp 96.8°F

## 2020-02-11 VITALS — Ht 62.0 in | Wt 290.0 lb

## 2020-02-11 DIAGNOSIS — M3500 Sicca syndrome, unspecified: Secondary | ICD-10-CM

## 2020-02-11 DIAGNOSIS — R110 Nausea: Secondary | ICD-10-CM | POA: Diagnosis not present

## 2020-02-11 DIAGNOSIS — N179 Acute kidney failure, unspecified: Secondary | ICD-10-CM | POA: Diagnosis not present

## 2020-02-11 DIAGNOSIS — M86271 Subacute osteomyelitis, right ankle and foot: Secondary | ICD-10-CM

## 2020-02-11 DIAGNOSIS — E1142 Type 2 diabetes mellitus with diabetic polyneuropathy: Secondary | ICD-10-CM

## 2020-02-11 LAB — BASIC METABOLIC PANEL
BUN: 15 mg/dL (ref 6–23)
CO2: 28 mEq/L (ref 19–32)
Calcium: 9.4 mg/dL (ref 8.4–10.5)
Chloride: 104 mEq/L (ref 96–112)
Creatinine, Ser: 1.7 mg/dL — ABNORMAL HIGH (ref 0.40–1.20)
GFR: 30.8 mL/min — ABNORMAL LOW (ref >60.00)
Glucose, Bld: 84 mg/dL (ref 70–99)
Potassium: 4.2 mEq/L (ref 3.5–5.1)
Sodium: 140 mEq/L (ref 135–145)

## 2020-02-11 MED ORDER — TRIMETHOBENZAMIDE HCL 300 MG PO CAPS: 300.0000 mg | ORAL_CAPSULE | Freq: Three times a day (TID) | ORAL | 0 refills | Status: DC

## 2020-02-11 MED ORDER — CEVIMELINE HCL 30 MG PO CAPS: 30.0000 mg | ORAL_CAPSULE | Freq: Three times a day (TID) | ORAL | 2 refills | Status: DC

## 2020-02-11 NOTE — Telephone Encounter (Signed)
[id number] - PA Case ID: [id number] - Rx #: [id number] Have initiated PA

## 2020-02-11 NOTE — Patient Instructions (Signed)
Give [place] 2-3 business days to get the results of your labs back.   Keep the diet clean and stay active.  Don't add back your meds just yet.   Let us know if you need anything.

## 2020-02-11 NOTE — Telephone Encounter (Signed)
Cevimeline not covered by insurance. Preferred alternative: pilocarpine tablets.

## 2020-02-11 NOTE — Progress Notes (Signed)
Post-Op Visit Note   Patient: [name]           Date of Birth: Dec 25, 1960           MRN: [id number] Visit Date: 02/11/2020 PCP: Shelda Pal, DO  Chief Complaint: No chief complaint on file.   HPI:  HPI The patient is a 59 year old woman who presents today status post right fourth and fifth ray amputations on May 19 of this year.  She has had less drainage she has been putting a little bit of weight down just to pivot on the right lower extremity.  She is concerned about the black color along her incision worried that it may be necrotic tissue.  Ortho Exam On examination of the right fourth and fifth ray amputation.  The incision is approximated with sutures unfortunately it appears she did have some mild dehiscence centrally this is covered with eschar now there is no surrounding erythema no active drainage.  She does have some scattered dried blood along the incision and some dry peeling skin there is no odor or drainage erythema no sign of infection  Visit Diagnoses: No diagnosis found.  Plan: No Epsom salt soaks.  Discussed cleansing daily with Dial soap.  Dry dressing changes.  Wrap with Ace wrap for compression and elevate.  She will follow-up in 2 weeks for suture removal.  Follow-Up Instructions: No follow-ups on file.   Imaging: No results found.  Orders:  No orders of the defined types were placed in this encounter.  No orders of the defined types were placed in this encounter.    PMFS History: Patient Active Problem List   Diagnosis Date Noted  . Sjogren's syndrome ([place]) 02/11/2020  . Acute hematogenous osteomyelitis of right foot ([place]) 01/28/2020  . Cutaneous abscess of right foot   . Subacute osteomyelitis, right ankle and foot ([place])   . Statin intolerance 08/16/2019  . PAF (paroxysmal atrial fibrillation) ([place]) 01/06/2019  . Gram-negative bacteremia 10/18/2018  . Streptococcal bacteremia 10/18/2018  . Ulcer of lower extremity, limited to  breakdown of skin ([place]) 10/03/2018  . CAD S/P percutaneous coronary angioplasty 04/19/2018  . Essential hypertension   . Moderate persistent asthma 03/21/2018  . NAFLD (nonalcoholic fatty liver disease)   . Recurrent cellulitis of lower extremity 01/02/2018  . Cellulitis of lower leg 01/02/2018  . Chronic diarrhea 05/08/2017  . H/O Clostridium difficile infection 05/08/2017  . Rectal bleeding 05/08/2017  . Hyperbilirubinemia 04/29/2016  . Hyponatremia 04/29/2016  . Cellulitis of leg, right 03/09/2016  . Mild persistent asthma 02/15/2016  . Allergic rhinitis due to pollen 02/15/2016  . Anaphylactic reaction due to food 02/10/2016  . Coronary artery disease involving native coronary artery of native heart without angina pectoris 02/10/2016  . Gastroesophageal reflux disease without esophagitis 02/10/2016  . Atopic eczema 02/10/2016  . Cervical nerve root disorder 12/15/2015  . Lumbar radiculopathy 12/15/2015  . Daytime somnolence 11/26/2015  . Venous stasis dermatitis of both lower extremities 02/11/2015  . Morbid obesity ([place]) 06/19/2014  . B-complex deficiency 03/26/2014  . Benign essential HTN 03/26/2014  . Chronic pain associated with significant psychosocial dysfunction 03/26/2014  . Diaphragmatic hernia 03/26/2014  . Gastroesophageal reflux disease 03/26/2014  . Hammer toe 03/26/2014  . H/O neoplasm 03/26/2014  . 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 Diabetic polyneuropathy ([place]) 03/26/2014  . Deafness, sensorineural 03/26/2014  . Fibromyalgia 01/20/2014  . 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 Insulin dependent diabetes mellitus ([place]) 11/11/2012  . Hyperlipidemia LDL goal <  70 11/11/2012  . Mixed connective tissue disease (HCC) 11/11/2012  . Hypothyroidism 11/11/2012  . Uncomplicated asthma 11/11/2012  . 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HCC) 02/20/2012  . 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  ANA positive 07/17/2011   Past Medical History:  Diagnosis Date  . Asthma    "on daily RX and rescue inhaler" (04/18/2018)  . Chronic diastolic CHF (congestive heart failure) (HCC) 09/2015  . Chronic lower back pain   . Chronic neck pain   . Chronic pain syndrome    Fentanyl Patch  . Colon polyps   . Coronary artery disease    cath with normal LM, 30% LAD, 85% mid RCA and 95% distal RCA s/p PCI of the mid to distal RCA and now on DAPT with ASA and Ticagrelor.    . Eczema   . Excessive daytime sleepiness 11/26/2015  . Fibromyalgia   . Gallstones   . GERD (gastroesophageal reflux disease)   . Heart murmur    "noted for the 1st time on 04/18/2018"  . History of blood transfusion 07/2010   "S/P oophorectomy"  . History of gout   . History of hiatal hernia 1980s   "gone now" (04/18/2018)  . Hyperlipidemia   . Hypertension    takes Metoprolol and Enalapril daily  . Hypothyroidism    takes Synthroid daily  . IBS (irritable bowel syndrome)   . Migraine    "nothing in the 2000s" (04/18/2018)  . Mixed connective tissue disease (HCC)   . NAFLD (nonalcoholic fatty liver disease)   . Pneumonia    "several times" (04/18/2018)  . Rheumatoid arthritis (HCC)    "hands, elbows, shoulders, probably knees" (04/18/2018)  . Scoliosis   . Spondylosis   . Type II diabetes mellitus (HCC)    takes Metformin and Hum R daily (04/18/2018)    Family History  Problem Relation Age of Onset  . CAD Mother   . Hypertension Mother   . Heart attack Mother   . CAD Father   . Heart attack Father   . Allergic rhinitis Father   . Asthma Father   . Hypertension Brother   . Hypertension Brother   . Pancreatic cancer Paternal Aunt   . Breast cancer Paternal Aunt   . Lung cancer Paternal Aunt     Past Surgical History:  Procedure Laterality Date  . ABDOMINAL HYSTERECTOMY  06/2005   "w/right ovariy"  . AMPUTATION Right 01/28/2020    RIGHT FOURTH AND FIFTH RAY AMPUTATION  . AMPUTATION Right 01/28/2020    Procedure: RIGHT FOURTH AND FIFTH RAY AMPUTATION,;  Surgeon: [name], MD;  Location: [facility] OR;  Service: Orthopedics;  Laterality: Right;  . APPENDECTOMY    . BREAST BIOPSY Bilateral    9 total (04/18/2018)  . CORONARY ANGIOPLASTY WITH STENT PLACEMENT  10/01/2015   normal LM, 30% LAD, 85% mid RCA and 95% distal RCA s/p PCI of the mid to distal RCA and now on DAPT with ASA and Ticagrelor.    . CORONARY STENT INTERVENTION Right 04/18/2018   Procedure: CORONARY STENT INTERVENTION;  Surgeon: [name], MD;  Location: [facility];  Service: Cardiovascular;  Laterality: Right;  . DILATION AND CURETTAGE OF UTERUS    . FRACTURE SURGERY    . LAPAROSCOPIC CHOLECYSTECTOMY    . LEFT HEART CATH AND CORONARY ANGIOGRAPHY N/A 04/18/2018   Procedure: LEFT HEART CATH AND CORONARY ANGIOGRAPHY;  Surgeon: [name], MD;  Location:  [facility];  Service: Cardiovascular;  Laterality: N/A;  . MUSCLE BIOPSY Left    "leg"  . OOPHORECTOMY  07/2010  . RADIOLOGY WITH ANESTHESIA N/A 05/25/2016   Procedure: RADIOLOGY WITH ANESTHESIA;  Surgeon: Medication Radiologist, MD;  Location: [facility] OR;  Service: Radiology;  Laterality: N/A;  . TONSILLECTOMY AND ADENOIDECTOMY    . WRIST FRACTURE SURGERY Left    "crushed it"   Social History   Occupational History  . Not on file  Tobacco Use  . Smoking status: Former Smoker    Packs/day: 1.00    Years: 20.00    Pack years: 20.00    Types: Cigarettes    Quit date: 02/11/2004    Years since quitting: 16.0  . Smokeless tobacco: Never Used  Substance and Sexual Activity  . Alcohol use: Yes    Comment: RARE  . Drug use: Not Currently    Types: Marijuana    Comment: "only in my teens"  . Sexual activity: Yes    Birth control/protection: Surgical

## 2020-02-11 NOTE — Telephone Encounter (Signed)
Not able to speak with a live person. All prompts are for in hospital admission. The pt is s/p surgery. Do you know what I am supposed to do with this?

## 2020-02-11 NOTE — Progress Notes (Signed)
Chief Complaint  Patient presents with  . Hospitalization Follow-up    HPI [name] is a 59 y.o. y.o. female who presents for a transition of care visit.  Pt was discharged from [facility] on 02/04/20.  Within 48 business hours of discharge our office contacted pt via telephone to coordinate care and needs.  She is here with her husband.  Patient was admitted on 01/28/2020 and discharged on 02/04/2020 from [facility].  She had acute osteomyelitis and required a partial amputation of her right foot.  She was discharged home with home physical therapy.  Acute kidney failure complicated her hospital course.  The nephrology team was consulted.  Outpatient follow-up was recommended and if her kidney function did not improve, it was recommended she follow-up with the nephrology team.  Her renally excreted medications have been stopped and she has been compliant and avoiding taking them.  Her chronic nausea has gotten worse since coming out of the hospital.  She has been taking Phenergan with less relief than usual.  She has failed Zofran in the past.  She is also on ciprofloxacin per her primary team while inpatient.  Of note, she continues to have a dry cough.  She wonders if it is related to her Sjogren's syndrome.  Her teeth are rotting due to lack of saliva.  She is interested in trying something to stimulate her salivary glands.  She does have wheezing but reports it feels like it is higher in her neck.  She has never been evaluated by the SLP team.  Past Medical History:  Diagnosis Date  . Asthma    "on daily RX and rescue inhaler" (04/18/2018)  . Chronic diastolic CHF (congestive heart failure) (HCC) 09/2015  . Chronic lower back pain   . Chronic neck pain   . Chronic pain syndrome    Fentanyl Patch  . Colon polyps   . Coronary artery disease    cath with normal LM, 30% LAD, 85% mid RCA and 95% distal RCA s/p PCI of the mid to distal RCA and now on DAPT with ASA and Ticagrelor.    . Eczema   .  Excessive daytime sleepiness 11/26/2015  . Fibromyalgia   . Gallstones   . GERD (gastroesophageal reflux disease)   . Heart murmur    "noted for the 1st time on 04/18/2018"  . History of blood transfusion 07/2010   "S/P oophorectomy"  . History of gout   . History of hiatal hernia 1980s   "gone now" (04/18/2018)  . Hyperlipidemia   . Hypertension    takes Metoprolol and Enalapril daily  . Hypothyroidism    takes Synthroid daily  . IBS (irritable bowel syndrome)   . Migraine    "nothing in the 2000s" (04/18/2018)  . Mixed connective tissue disease (HCC)   . NAFLD (nonalcoholic fatty liver disease)   . Pneumonia    "several times" (04/18/2018)  . Rheumatoid arthritis (HCC)    "hands, elbows, shoulders, probably knees" (04/18/2018)  . Scoliosis   . Spondylosis   . Type II diabetes mellitus (HCC)    takes Metformin and Hum R daily (04/18/2018)   Past Surgical History:  Procedure Laterality Date  . ABDOMINAL HYSTERECTOMY  06/2005   "w/right ovariy"  . AMPUTATION Right 01/28/2020    RIGHT FOURTH AND FIFTH RAY AMPUTATION  . AMPUTATION Right 01/28/2020   Procedure: RIGHT FOURTH AND FIFTH RAY AMPUTATION,;  Surgeon: [name], MD;  Location: [facility] OR;  Service: Orthopedics;  Laterality:  Right;  Marland Kitchen APPENDECTOMY    . BREAST BIOPSY Bilateral    9 total (04/18/2018)  . CORONARY ANGIOPLASTY WITH STENT PLACEMENT  10/01/2015   normal LM, 30% LAD, 85% mid RCA and 95% distal RCA s/p PCI of the mid to distal RCA and now on DAPT with ASA and Ticagrelor.    . CORONARY STENT INTERVENTION Right 04/18/2018   Procedure: CORONARY STENT INTERVENTION;  Surgeon: [name], MD;  [facility]: [facility];  Service: Cardiovascular;  Laterality: Right;  . DILATION AND CURETTAGE OF UTERUS    . FRACTURE SURGERY    . LAPAROSCOPIC CHOLECYSTECTOMY    . LEFT HEART CATH AND CORONARY ANGIOGRAPHY N/A 04/18/2018   Procedure: LEFT HEART CATH AND CORONARY ANGIOGRAPHY;  Surgeon: [name], MD;   Location: [facility];  Service: Cardiovascular;  Laterality: N/A;  . MUSCLE BIOPSY Left    "leg"  . OOPHORECTOMY  07/2010  . RADIOLOGY WITH ANESTHESIA N/A 05/25/2016   Procedure: RADIOLOGY WITH ANESTHESIA;  Surgeon: Medication Radiologist, MD;  Location: [facility] OR;  Service: Radiology;  Laterality: N/A;  . TONSILLECTOMY AND ADENOIDECTOMY    . WRIST FRACTURE SURGERY Left    "crushed it"   Family History  Problem Relation Age of Onset  . CAD Mother   . Hypertension Mother   . Heart attack Mother   . CAD Father   . Heart attack Father   . Allergic rhinitis Father   . Asthma Father   . Hypertension Brother   . Hypertension Brother   . Pancreatic cancer Paternal Aunt   . Breast cancer Paternal Aunt   . Lung cancer Paternal Aunt    Allergies as of 02/11/2020      Reactions   Fish Allergy Anaphylaxis   INCLUDES OMEGA 3 OILS   Fish Oil Anaphylaxis, Swelling   THROAT SWELLS INCLUDES FISH AS A CLASS   Omega-3 Fatty Acids Swelling   Throat swelling    Crestor [rosuvastatin] Other (See Comments)   Extreme joint pain, to this & other statins   Gabapentin Anxiety   Anxiety on high doses   Lovastatin Other (See Comments)   EXTREME JOINT PAIN   Metronidazole Nausea Only, Swelling   Headache and shakes Nausea and vomiting   Red Yeast Rice [cholestin] Other (See Comments)   Muscle pain and severe joint pain.    Rotigotine Swelling   Extreme edema   Atrovent Hfa [ipratropium Bromide Hfa] Other (See Comments)   wheezing   Red Yeast Rice Extract    Joint pain   Victoza [liraglutide]    Unsure of reaction    Suprep [na Sulfate-k Sulfate-mg Sulf] Nausea And Vomiting   Tape Other (See Comments)   Electrodes causes skin breakdown      Medication List       Accurate as of February 11, 2020 12:09 PM. If you have any questions, ask your nurse or doctor.        STOP taking these medications   Breo Ellipta 200-25 MCG/INH Aepb Generic drug: fluticasone furoate-vilanterol Stopped by:  Sharlene Dory, DO   umeclidinium bromide 62.5 MCG/INH Aepb Commonly known as: INCRUSE ELLIPTA Stopped by: Sharlene Dory, DO     TAKE these medications   acetaminophen 650 MG CR tablet Commonly known as: TYLENOL Take 1,300 mg by mouth in the morning and at bedtime.   aspirin EC 81 MG tablet Take 81 mg by mouth daily.   augmented betamethasone dipropionate 0.05 % cream Commonly known  as: DIPROLENE-AF APPLY 1 CREAM TOPICALLY ONCE DAILY AS NEEDED FOR  ECZEMA   Bayer Contour Test test strip Generic drug: glucose blood 1 each 4 (four) times daily.   cevimeline 30 MG capsule Commonly known as: EVOXAC Take 1 capsule (30 mg total) by mouth 3 (three) times daily. Started by: Shelda Pal, DO   ciprofloxacin 250 MG tablet Commonly known as: CIPRO Take 1 tablet (250 mg total) by mouth 2 (two) times daily.   clopidogrel 75 MG tablet Commonly known as: PLAVIX Take 1 tablet by mouth once daily with breakfast What changed: See the new instructions.   clotrimazole-betamethasone cream Commonly known as: LOTRISONE Apply 1 application topically 2 (two) times daily as needed (eczema).   diphenhydrAMINE 25 MG tablet Commonly known as: BENADRYL Take 25 mg by mouth daily as needed for allergies.   EPINEPHrine 0.3 mg/0.3 mL Soaj injection Commonly known as: EpiPen 2-Pak USE AS DIRECTED FOR SEVERE ALLERGIC REACTION. What changed:   how much to take  how to take this  when to take this  reasons to take this  additional instructions   fluconazole 150 MG tablet Commonly known as: DIFLUCAN Take 1 tab, repeat in 48 hours if no improvement.   fluticasone 50 MCG/ACT nasal spray Commonly known as: FLONASE USE 2 SPRAY(S) IN EACH NOSTRIL ONCE DAILY AS NEEDED FOR  ALLERGIES  OR  RHINITIS What changed: See the new instructions.   furosemide 20 MG tablet Commonly known as: Lasix Take 0.5 tablets (10 mg total) by mouth daily. As needed for swelling     gabapentin 100 MG capsule Commonly known as: NEURONTIN Take 1 capsule (100 mg total) by mouth 3 (three) times daily. Pt takes 400mg  in the morning, 400mg  at lunchtime and 700mg  in the evening   guaiFENesin-dextromethorphan 100-10 MG/5ML syrup Commonly known as: ROBITUSSIN DM Take 5 mLs by mouth every 4 (four) hours as needed for cough.   HUMULIN R 500 UNIT/ML injection Generic drug: insulin regular human CONCENTRATED Inject 40-150 Units into the skin 3 (three) times daily with meals.   hydroxychloroquine 200 MG tablet Commonly known as: PLAQUENIL Take 200 mg by mouth 2 (two) times daily.   isosorbide mononitrate 30 MG 24 hr tablet Commonly known as: IMDUR Take 1 tablet (30 mg total) by mouth daily.   levocetirizine 5 MG tablet Commonly known as: XYZAL TAKE 1 TABLET BY MOUTH ONCE DAILY IN THE EVENING   levothyroxine 200 MCG tablet Commonly known as: SYNTHROID Take 200 mcg by mouth daily before breakfast.   magnesium oxide 400 MG tablet Commonly known as: MAG-OX Take 400-1,600 mg by mouth 2 (two) times daily as needed (cramps).   metFORMIN 500 MG 24 hr tablet Commonly known as: GLUCOPHAGE-XR Take 2 tablets (1,000 mg total) by mouth 2 (two) times daily.   Mucinex Maximum Strength 1200 MG Tb12 Generic drug: Guaifenesin Take 1,200 mg by mouth 2 (two) times daily.   NEO-SYNEPHRINE NA Place 1 spray into both nostrils daily as needed (congestion).   nitroGLYCERIN 0.4 MG SL tablet Commonly known as: NITROSTAT DISSOLVE ONE TABLET UNDER THE TONGUE EVERY 5 MINUTES AS NEEDED FOR CHEST PAIN.  DO NOT EXCEED A TOTAL OF 3 DOSES IN 15 MINUTES What changed: See the new instructions.   ondansetron 4 MG tablet Commonly known as: ZOFRAN Take 8 mg by mouth every 6 (six) hours as needed for nausea or vomiting.   Oxycodone HCl 10 MG Tabs Take 10 mg by mouth every 6 (six) hours as needed (  pain).   oxymetazoline 0.05 % nasal spray Commonly known as: AFRIN Place 1 spray into both  nostrils 2 (two) times daily as needed for congestion.   pantoprazole 40 MG tablet Commonly known as: PROTONIX Take 1 tablet by mouth once daily   potassium chloride 10 MEQ tablet Commonly known as: KLOR-CON Take 1 tab for every 20 mg of Lasix taken. What changed:   how much to take  how to take this  when to take this  additional instructions   pramipexole 0.5 MG tablet Commonly known as: MIRAPEX Take 0.5 mg by mouth See admin instructions. Take 0.5 mg up to 6 times daily as needed for restless legs   ProAir HFA 108 (90 Base) MCG/ACT inhaler Generic drug: albuterol INHALE 2 PUFFS BY MOUTH EVERY 4 HOURS AS NEEDED FOR WHEEZING AND FOR SHORTNESS OF BREATH What changed: See the new instructions.   albuterol (2.5 MG/3ML) 0.083% nebulizer solution Commonly known as: PROVENTIL Take 3 mLs (2.5 mg total) by nebulization every 6 (six) hours as needed for wheezing or shortness of breath. What changed:   how much to take  when to take this   promethazine 25 MG tablet Commonly known as: PHENERGAN Take 1 tablet (25 mg total) by mouth every 6 (six) hours as needed for nausea or vomiting.   saccharomyces boulardii 250 MG capsule Commonly known as: FLORASTOR Take 250 mg by mouth 3 (three) times daily.   SYSTANE ULTRA OP Place 2 drops into both eyes daily as needed (dry eyes).   trimethobenzamide 300 MG capsule Commonly known as: TIGAN Take 1 capsule (300 mg total) by mouth 3 (three) times daily. Started by: Sharlene Dory, DO   Vitamin D (Ergocalciferol) 1.25 MG (50000 UNIT) Caps capsule Commonly known as: DRISDOL Take 1 capsule (50,000 Units total) by mouth every 7 (seven) days. Take on Friday What changed:   when to take this  additional instructions       ROS:  Constitutional: No fevers or chills, no weight loss HEENT: No headaches, hearing loss, or runny nose, no sore throat Heart: No chest pain Lungs: No SOB, no cough Abd: No bowel changes, no pain,  no N/V GU: No urinary complaints Neuro: No numbness, tingling or weakness Msk: No joint or muscle pain  Objective BP 132/82 (BP Location: Left Arm, Patient Position: Sitting, Cuff Size: Normal)   Pulse 77   Temp (!) 96.8 F (36 C) (Temporal)   SpO2 100%  General Appearance:  awake, alert, oriented, in no acute distress and well developed, well nourished Skin:  there are no suspicious lesions or rashes of concern Head/face:  NCAT Eyes:  EOMI, PERRLA Ears:  canals and TMs NI Nose/Sinuses:  negative Mouth/Throat:  Mucosa moist, no lesions; pharynx without erythema, edema or exudate. Neck:  neck- supple, no mass, non-tender and no jvd Lungs: Clear to auscultation.  No rales, rhonchi, or wheezing. Normal effort, no accessory muscle use. Heart:  Heart sounds are normal.  Regular rate and rhythm without murmur, gallop or rub. No bruits. Abdomen:  BS+, soft, NT, ND, no masses or organomegaly Musculoskeletal:  No muscle group atrophy or asymmetry, dressing noted over right foot, clean, dry and intact Neurologic:  Alert and oriented x 3 Psych exam: Nml mood and affect, age appropriate judgment and insight  AKI (acute kidney injury) (HCC) - Plan: Basic metabolic panel  Nausea  Sjogren's syndrome, with unspecified organ involvement [facility])  Discharge summary and medication list have been reviewed/reconciled.  Labs pending at  the time of discharge have been reviewed or are still pending at the time of this visit.  Follow-up labs and appointments have been ordered and/or coordinated appropriately.  She has an appointment with Dr. Lajoyce Corners this afternoon to check on her surgical site.  We will recheck her renal function today.  It is improving, we will recheck next week.  If it is back to baseline, we will monitor routinely after restarting her medications.  If it is worsening or unchanged since discharge, she will call [facility] for an appointment.  Stay hydrated.  She is on Cipro  and Phenergan. Will avoid more QT-prolonging agents by adding Tigan to help w nausea.   TRANSITIONAL CARE MANAGEMENT CERTIFICATION:  I certify the following are true:   1. Communication with the patient/care giver was made within 2 business days of discharge.  2. Complexity of Medical decision making is moderate.  3. Face to face visit occurred within 14 days of discharge.   F/u pending lab results. The patient voiced understanding and agreement to the plan.  Jilda Roche [place], DO 02/11/20 12:09 PM

## 2020-02-13 ENCOUNTER — Other Ambulatory Visit: Payer: Self-pay | Admitting: Family Medicine

## 2020-02-13 DIAGNOSIS — N179 Acute kidney failure, unspecified: Secondary | ICD-10-CM

## 2020-02-16 ENCOUNTER — Other Ambulatory Visit: Payer: Self-pay | Admitting: Family Medicine

## 2020-02-16 ENCOUNTER — Other Ambulatory Visit (INDEPENDENT_AMBULATORY_CARE_PROVIDER_SITE_OTHER): Payer: 59

## 2020-02-16 ENCOUNTER — Other Ambulatory Visit: Payer: Self-pay

## 2020-02-16 ENCOUNTER — Telehealth: Payer: Self-pay | Admitting: Orthopedic Surgery

## 2020-02-16 ENCOUNTER — Other Ambulatory Visit: Payer: Self-pay | Admitting: Cardiology

## 2020-02-16 DIAGNOSIS — N179 Acute kidney failure, unspecified: Secondary | ICD-10-CM

## 2020-02-16 MED ORDER — PILOCARPINE HCL 5 MG PO TABS: 5.0000 mg | ORAL_TABLET | Freq: Two times a day (BID) | ORAL | 2 refills | Status: DC

## 2020-02-16 MED ORDER — LEVOTHYROXINE SODIUM 200 MCG PO TABS: 200.0000 ug | ORAL_TABLET | Freq: Every day | ORAL | 1 refills | Status: DC

## 2020-02-16 NOTE — Addendum Note (Signed)
Addended by: Radene Gunning on: 02/16/2020 08:57 AM   Modules accepted: Orders

## 2020-02-16 NOTE — Telephone Encounter (Signed)
Pt s/p 4th and 5th ray amputation 01/28/20 called and lm on vm to advise verbal ok for the orders below. To call with questions.

## 2020-02-16 NOTE — Telephone Encounter (Signed)
PA denied. Preferred alternative: pilocarpine tablets

## 2020-02-16 NOTE — Telephone Encounter (Signed)
[name], from [facility], called requesting VO for the following:  OT  1x for 1 week 1x for every other week for 3 weeks. 1x a week for 1 week   CB#[phone].  Thank you.

## 2020-02-17 LAB — BASIC METABOLIC PANEL
BUN: 11 mg/dL (ref 6–23)
CO2: 25 mEq/L (ref 19–32)
Calcium: 8.9 mg/dL (ref 8.4–10.5)
Chloride: 103 mEq/L (ref 96–112)
Creatinine, Ser: 1.25 mg/dL — ABNORMAL HIGH (ref 0.40–1.20)
GFR: 43.92 mL/min — ABNORMAL LOW (ref >60.00)
Glucose, Bld: 197 mg/dL — ABNORMAL HIGH (ref 70–99)
Potassium: 4.1 mEq/L (ref 3.5–5.1)
Sodium: 138 mEq/L (ref 135–145)

## 2020-02-18 ENCOUNTER — Other Ambulatory Visit: Payer: Self-pay | Admitting: Family Medicine

## 2020-02-18 DIAGNOSIS — J45909 Unspecified asthma, uncomplicated: Secondary | ICD-10-CM

## 2020-02-18 DIAGNOSIS — N179 Acute kidney failure, unspecified: Secondary | ICD-10-CM

## 2020-02-18 MED ORDER — ALBUTEROL SULFATE HFA 108 (90 BASE) MCG/ACT IN AERS: INHALATION_SPRAY | RESPIRATORY_TRACT | 3 refills | Status: DC

## 2020-02-20 MED ORDER — HYDROXYCHLOROQUINE SULFATE 200 MG PO TABS: 200.0000 mg | ORAL_TABLET | Freq: Two times a day (BID) | ORAL | 0 refills | Status: DC

## 2020-02-20 NOTE — Telephone Encounter (Signed)
Surgery was last month.  I do not have anything new to schedule for her, so I don't they're talking about surgery.

## 2020-02-21 ENCOUNTER — Other Ambulatory Visit: Payer: Self-pay | Admitting: Family Medicine

## 2020-02-21 DIAGNOSIS — B9689 Other specified bacterial agents as the cause of diseases classified elsewhere: Secondary | ICD-10-CM

## 2020-02-21 DIAGNOSIS — J208 Acute bronchitis due to other specified organisms: Secondary | ICD-10-CM

## 2020-02-25 ENCOUNTER — Other Ambulatory Visit: Payer: Self-pay

## 2020-02-25 ENCOUNTER — Ambulatory Visit (INDEPENDENT_AMBULATORY_CARE_PROVIDER_SITE_OTHER): Payer: 59 | Admitting: Family

## 2020-02-25 ENCOUNTER — Encounter: Payer: Self-pay | Admitting: Family

## 2020-02-25 VITALS — Ht 62.0 in | Wt 290.0 lb

## 2020-02-25 DIAGNOSIS — Z89421 Acquired absence of other right toe(s): Secondary | ICD-10-CM

## 2020-02-25 NOTE — Progress Notes (Signed)
Post-Op Visit Note   Patient: [name]           Date of Birth: 11/08/1960           MRN: [id number] Visit Date: 02/25/2020 PCP: Sharlene Dory, DO  Chief Complaint:  Chief Complaint  Patient presents with  . Right Foot - Routine Post Op    01/28/20 right 4th and 5th ray amputations     HPI:  HPI The patient is a 59 year old woman seen today 4 weeks status post 4th and 5th ray amputations on right. Has been minimizing weight bearing on RLE. Doing dry dressing changes daily.   Ortho Exam Incision approximated with sutures. Eschar nearly full length of incision, appears to have gaped open 1 cm in width. There is no probing. No surrounding maceration or erythema. No drainage. No sign of infection.  Visit Diagnoses:  1. 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 Will begin every other day silvadene dressing changes to eschar. Continue to minimize weight bearing. Follow up in office in 2 weeks.   Follow-Up Instructions: Return in about 2 weeks (around 03/10/2020).   Imaging: No results found.  Orders:  No orders of the defined types were placed in this encounter.  No orders of the defined types were placed in this encounter.    PMFS History: Patient Active Problem List   Diagnosis Date Noted  . Sjogren's syndrome (HCC) 02/11/2020  . Acute hematogenous osteomyelitis of right foot (HCC) 01/28/2020  . Cutaneous abscess of right foot   . Subacute osteomyelitis, right ankle and foot (HCC)   . Statin intolerance 08/16/2019  . PAF (paroxysmal atrial fibrillation) (HCC) 01/06/2019  . Gram-negative bacteremia 10/18/2018  . Streptococcal bacteremia 10/18/2018  . Ulcer of lower extremity, limited to breakdown of skin (HCC) 10/03/2018  . CAD S/P percutaneous coronary angioplasty 04/19/2018  . Essential hypertension   . Moderate persistent asthma 03/21/2018  . NAFLD (nonalcoholic fatty  liver disease)   . Recurrent cellulitis of lower extremity 01/02/2018  . Cellulitis of lower leg 01/02/2018  . Chronic diarrhea 05/08/2017  . H/O Clostridium difficile infection 05/08/2017  . Rectal bleeding 05/08/2017  . Hyperbilirubinemia 04/29/2016  . Hyponatremia 04/29/2016  . Cellulitis of leg, right 03/09/2016  . Mild persistent asthma 02/15/2016  . Allergic rhinitis due to pollen 02/15/2016  . Anaphylactic reaction due to food 02/10/2016  . Coronary artery disease involving native coronary artery of native heart without angina pectoris 02/10/2016  . Gastroesophageal reflux disease without esophagitis 02/10/2016  . Atopic eczema 02/10/2016  . Cervical nerve root disorder 12/15/2015  . Lumbar radiculopathy 12/15/2015  . Daytime somnolence 11/26/2015  . Venous stasis dermatitis of both lower extremities 02/11/2015  . Morbid obesity (HCC) 06/19/2014  . B-complex deficiency 03/26/2014  . Benign essential HTN 03/26/2014  . Chronic pain associated with significant psychosocial dysfunction 03/26/2014  . Diaphragmatic hernia 03/26/2014  . Gastroesophageal reflux disease 03/26/2014  . Hammer toe 03/26/2014  . H/O neoplasm 03/26/2014  . 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 Diabetic polyneuropathy (HCC) 03/26/2014  . Deafness, sensorineural 03/26/2014  . Fibromyalgia 01/20/2014  . 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 Insulin dependent diabetes mellitus (HCC) 11/11/2012  . Hyperlipidemia LDL goal <70 11/11/2012  . Mixed connective tissue disease (HCC) 11/11/2012  . Hypothyroidism 11/11/2012  . Uncomplicated asthma 11/11/2012  . 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HCC) 02/20/2012  .  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 ANA positive 07/17/2011   Past Medical History:  Diagnosis Date  . Asthma    "on daily RX and rescue inhaler" (04/18/2018)  . Chronic diastolic CHF (congestive heart failure) (HCC) 09/2015    . Chronic lower back pain   . Chronic neck pain   . Chronic pain syndrome    Fentanyl Patch  . Colon polyps   . Coronary artery disease    cath with normal LM, 30% LAD, 85% mid RCA and 95% distal RCA s/p PCI of the mid to distal RCA and now on DAPT with ASA and Ticagrelor.    . Eczema   . Excessive daytime sleepiness 11/26/2015  . Fibromyalgia   . Gallstones   . GERD (gastroesophageal reflux disease)   . Heart murmur    "noted for the 1st time on 04/18/2018"  . History of blood transfusion 07/2010   "S/P oophorectomy"  . History of gout   . History of hiatal hernia 1980s   "gone now" (04/18/2018)  . Hyperlipidemia   . Hypertension    takes Metoprolol and Enalapril daily  . Hypothyroidism    takes Synthroid daily  . IBS (irritable bowel syndrome)   . Migraine    "nothing in the 2000s" (04/18/2018)  . Mixed connective tissue disease (HCC)   . NAFLD (nonalcoholic fatty liver disease)   . Pneumonia    "several times" (04/18/2018)  . Rheumatoid arthritis (HCC)    "hands, elbows, shoulders, probably knees" (04/18/2018)  . Scoliosis   . Spondylosis   . Type II diabetes mellitus (HCC)    takes Metformin and Hum R daily (04/18/2018)    Family History  Problem Relation Age of Onset  . CAD Mother   . Hypertension Mother   . Heart attack Mother   . CAD Father   . Heart attack Father   . Allergic rhinitis Father   . Asthma Father   . Hypertension Brother   . Hypertension Brother   . Pancreatic cancer Paternal Aunt   . Breast cancer Paternal Aunt   . Lung cancer Paternal Aunt     Past Surgical History:  Procedure Laterality Date  . ABDOMINAL HYSTERECTOMY  06/2005   "w/right ovariy"  . AMPUTATION Right 01/28/2020    RIGHT FOURTH AND FIFTH RAY AMPUTATION  . AMPUTATION Right 01/28/2020   Procedure: RIGHT FOURTH AND FIFTH RAY AMPUTATION,;  Surgeon: [name], MD;  Location: [facility] OR;  Service: Orthopedics;  Laterality: Right;  . APPENDECTOMY    . BREAST BIOPSY Bilateral    9 total  (04/18/2018)  . CORONARY ANGIOPLASTY WITH STENT PLACEMENT  10/01/2015   normal LM, 30% LAD, 85% mid RCA and 95% distal RCA s/p PCI of the mid to distal RCA and now on DAPT with ASA and Ticagrelor.    . CORONARY STENT INTERVENTION Right 04/18/2018   Procedure: CORONARY STENT INTERVENTION;  Surgeon: [name], MD;  Location: [facility];  Service: Cardiovascular;  Laterality: Right;  . DILATION AND CURETTAGE OF UTERUS    . FRACTURE SURGERY    . LAPAROSCOPIC CHOLECYSTECTOMY    . LEFT HEART CATH AND CORONARY ANGIOGRAPHY N/A 04/18/2018   Procedure: LEFT HEART CATH AND CORONARY ANGIOGRAPHY;  Surgeon: [name], MD;  Location: [facility];  Service: Cardiovascular;  Laterality: N/A;  . MUSCLE BIOPSY Left    "leg"  . OOPHORECTOMY  07/2010  . RADIOLOGY WITH ANESTHESIA  N/A 05/25/2016   Procedure: RADIOLOGY WITH ANESTHESIA;  Surgeon: Medication Radiologist, MD;  Location: [facility];  Service: Radiology;  Laterality: N/A;  . TONSILLECTOMY AND ADENOIDECTOMY    . WRIST FRACTURE SURGERY Left    "crushed it"   Social History   Occupational History  . Not on file  Tobacco Use  . Smoking status: Former Smoker    Packs/day: 1.00    Years: 20.00    Pack years: 20.00    Types: Cigarettes    Quit date: 02/11/2004    Years since quitting: 16.0  . Smokeless tobacco: Never Used  Vaping Use  . Vaping Use: Never used  Substance and Sexual Activity  . Alcohol use: Yes    Comment: RARE  . Drug use: Not Currently    Types: Marijuana    Comment: "only in my teens"  . Sexual activity: Yes    Birth control/protection: Surgical

## 2020-02-26 ENCOUNTER — Other Ambulatory Visit (INDEPENDENT_AMBULATORY_CARE_PROVIDER_SITE_OTHER): Payer: 59

## 2020-02-26 DIAGNOSIS — N179 Acute kidney failure, unspecified: Secondary | ICD-10-CM

## 2020-02-27 LAB — BASIC METABOLIC PANEL
BUN: 10 mg/dL (ref 6–23)
CO2: 26 mEq/L (ref 19–32)
Calcium: 9.4 mg/dL (ref 8.4–10.5)
Chloride: 102 mEq/L (ref 96–112)
Creatinine, Ser: 0.97 mg/dL (ref 0.40–1.20)
GFR: 58.85 mL/min — ABNORMAL LOW (ref >60.00)
Glucose, Bld: 279 mg/dL — ABNORMAL HIGH (ref 70–99)
Potassium: 4 mEq/L (ref 3.5–5.1)
Sodium: 138 mEq/L (ref 135–145)

## 2020-03-01 ENCOUNTER — Other Ambulatory Visit: Payer: Self-pay | Admitting: Family Medicine

## 2020-03-01 DIAGNOSIS — J208 Acute bronchitis due to other specified organisms: Secondary | ICD-10-CM

## 2020-03-01 DIAGNOSIS — B9689 Other specified bacterial agents as the cause of diseases classified elsewhere: Secondary | ICD-10-CM

## 2020-03-05 ENCOUNTER — Telehealth: Payer: Self-pay | Admitting: Orthopedic Surgery

## 2020-03-05 NOTE — Telephone Encounter (Signed)
I left voicemail for [name] giving verbal orders.

## 2020-03-05 NOTE — Telephone Encounter (Signed)
[name] from [facility] called.   She needs verbal orders for OT 1wk4 to continue working on her functional mobility and home safety. A voicemail is okay  Call back: [phone]

## 2020-03-10 ENCOUNTER — Encounter: Payer: Self-pay | Admitting: Family

## 2020-03-10 ENCOUNTER — Ambulatory Visit (INDEPENDENT_AMBULATORY_CARE_PROVIDER_SITE_OTHER): Payer: 59 | Admitting: Family

## 2020-03-10 ENCOUNTER — Other Ambulatory Visit: Payer: Self-pay

## 2020-03-10 VITALS — Ht 62.0 in | Wt 290.0 lb

## 2020-03-10 DIAGNOSIS — Z89421 Acquired absence of other right toe(s): Secondary | ICD-10-CM

## 2020-03-10 NOTE — Progress Notes (Signed)
Post-Op Visit Note   Patient: [name]           Date of Birth: Oct 25, 1960           MRN: [id number] Visit Date: 03/10/2020 PCP: Sharlene Dory, DO  Chief Complaint:  Chief Complaint  Patient presents with  . Right Foot - Routine Post Op    01/28/20 right 4th and 5th ray amputations       HPI:  HPI The patient is a 59 year old woman who presents today status post right fourth and fifth ray amputation on May 19 she has been nonweightbearing in a wheel chair doing daily Dial soap cleansing and dry dressing changes  Ortho Exam About 2 cm of the incision is well-healed.  The distal aspect had initially gaped open and is now covered with eschar this continues to be about 1 cm in width.  There is no surrounding erythema no active drainage sutures do remain in place there is no sign of infection very minimal edema of her foot  Visit Diagnoses:  1. 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  She will continue minimizing her weightbearing.  She may shower and get this wet.  Continue with dry dressing changes.  Follow-Up Instructions: Return in about 2 weeks (around 03/24/2020).   Imaging: No results found.  Orders:  No orders of the defined types were placed in this encounter.  No orders of the defined types were placed in this encounter.    PMFS History: Patient Active Problem List   Diagnosis Date Noted  . Sjogren's syndrome (HCC) 02/11/2020  . Acute hematogenous osteomyelitis of right foot (HCC) 01/28/2020  . Cutaneous abscess of right foot   . Subacute osteomyelitis, right ankle and foot (HCC)   . Statin intolerance 08/16/2019  . PAF (paroxysmal atrial fibrillation) (HCC) 01/06/2019  . Gram-negative bacteremia 10/18/2018  . Streptococcal bacteremia 10/18/2018  . Ulcer of lower extremity, limited to breakdown of skin (HCC) 10/03/2018  . CAD S/P percutaneous coronary angioplasty 04/19/2018  .  Essential hypertension   . Moderate persistent asthma 03/21/2018  . NAFLD (nonalcoholic fatty liver disease)   . Recurrent cellulitis of lower extremity 01/02/2018  . Cellulitis of lower leg 01/02/2018  . Chronic diarrhea 05/08/2017  . H/O Clostridium difficile infection 05/08/2017  . Rectal bleeding 05/08/2017  . Hyperbilirubinemia 04/29/2016  . Hyponatremia 04/29/2016  . Cellulitis of leg, right 03/09/2016  . Mild persistent asthma 02/15/2016  . Allergic rhinitis due to pollen 02/15/2016  . Anaphylactic reaction due to food 02/10/2016  . Coronary artery disease involving native coronary artery of native heart without angina pectoris 02/10/2016  . Gastroesophageal reflux disease without esophagitis 02/10/2016  . Atopic eczema 02/10/2016  . Cervical nerve root disorder 12/15/2015  . Lumbar radiculopathy 12/15/2015  . Daytime somnolence 11/26/2015  . Venous stasis dermatitis of both lower extremities 02/11/2015  . Morbid obesity (HCC) 06/19/2014  . B-complex deficiency 03/26/2014  . Benign essential HTN 03/26/2014  . Chronic pain associated with significant psychosocial dysfunction 03/26/2014  . Diaphragmatic hernia 03/26/2014  . Gastroesophageal reflux disease 03/26/2014  . Hammer toe 03/26/2014  . H/O neoplasm 03/26/2014  . 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 Diabetic polyneuropathy (HCC) 03/26/2014  . Deafness, sensorineural 03/26/2014  . Fibromyalgia 01/20/2014  . 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 Insulin dependent diabetes mellitus (HCC) 11/11/2012  . Hyperlipidemia LDL goal <70 11/11/2012  . Mixed connective tissue disease (  HCC) 11/11/2012  . Hypothyroidism 11/11/2012  . Uncomplicated asthma 11/11/2012  . 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HCC) 02/20/2012  . 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 ANA positive 07/17/2011   Past Medical History:  Diagnosis Date  . Asthma    "on daily RX and  rescue inhaler" (04/18/2018)  . Chronic diastolic CHF (congestive heart failure) (HCC) 09/2015  . Chronic lower back pain   . Chronic neck pain   . Chronic pain syndrome    Fentanyl Patch  . Colon polyps   . Coronary artery disease    cath with normal LM, 30% LAD, 85% mid RCA and 95% distal RCA s/p PCI of the mid to distal RCA and now on DAPT with ASA and Ticagrelor.    . Eczema   . Excessive daytime sleepiness 11/26/2015  . Fibromyalgia   . Gallstones   . GERD (gastroesophageal reflux disease)   . Heart murmur    "noted for the 1st time on 04/18/2018"  . History of blood transfusion 07/2010   "S/P oophorectomy"  . History of gout   . History of hiatal hernia 1980s   "gone now" (04/18/2018)  . Hyperlipidemia   . Hypertension    takes Metoprolol and Enalapril daily  . Hypothyroidism    takes Synthroid daily  . IBS (irritable bowel syndrome)   . Migraine    "nothing in the 2000s" (04/18/2018)  . Mixed connective tissue disease (HCC)   . NAFLD (nonalcoholic fatty liver disease)   . Pneumonia    "several times" (04/18/2018)  . Rheumatoid arthritis (HCC)    "hands, elbows, shoulders, probably knees" (04/18/2018)  . Scoliosis   . Spondylosis   . Type II diabetes mellitus (HCC)    takes Metformin and Hum R daily (04/18/2018)    Family History  Problem Relation Age of Onset  . CAD Mother   . Hypertension Mother   . Heart attack Mother   . CAD Father   . Heart attack Father   . Allergic rhinitis Father   . Asthma Father   . Hypertension Brother   . Hypertension Brother   . Pancreatic cancer Paternal Aunt   . Breast cancer Paternal Aunt   . Lung cancer Paternal Aunt     Past Surgical History:  Procedure Laterality Date  . ABDOMINAL HYSTERECTOMY  06/2005   "w/right ovariy"  . AMPUTATION Right 01/28/2020    RIGHT FOURTH AND FIFTH RAY AMPUTATION  . AMPUTATION Right 01/28/2020   Procedure: RIGHT FOURTH AND FIFTH RAY AMPUTATION,;  Surgeon: [name], MD;  Location: [facility] OR;   Service: Orthopedics;  Laterality: Right;  . APPENDECTOMY    . BREAST BIOPSY Bilateral    9 total (04/18/2018)  . CORONARY ANGIOPLASTY WITH STENT PLACEMENT  10/01/2015   normal LM, 30% LAD, 85% mid RCA and 95% distal RCA s/p PCI of the mid to distal RCA and now on DAPT with ASA and Ticagrelor.    . CORONARY STENT INTERVENTION Right 04/18/2018   Procedure: CORONARY STENT INTERVENTION;  Surgeon: [name], MD;  Location: [facility];  Service: Cardiovascular;  Laterality: Right;  . DILATION AND CURETTAGE OF UTERUS    . FRACTURE SURGERY    . LAPAROSCOPIC CHOLECYSTECTOMY    . LEFT HEART CATH AND CORONARY ANGIOGRAPHY N/A 04/18/2018   Procedure: LEFT HEART CATH AND CORONARY ANGIOGRAPHY;  Surgeon: [name], MD;  Location: [facility];  Service: Cardiovascular;  Laterality: N/A;  . MUSCLE BIOPSY Left    "leg"  . OOPHORECTOMY  07/2010  . RADIOLOGY WITH ANESTHESIA N/A 05/25/2016   Procedure: RADIOLOGY WITH ANESTHESIA;  Surgeon: Medication Radiologist, MD;  Location: [facility] OR;  Service: Radiology;  Laterality: N/A;  . TONSILLECTOMY AND ADENOIDECTOMY    . WRIST FRACTURE SURGERY Left    "crushed it"   Social History   Occupational History  . Not on file  Tobacco Use  . Smoking status: Former Smoker    Packs/day: 1.00    Years: 20.00    Pack years: 20.00    Types: Cigarettes    Quit date: 02/11/2004    Years since quitting: 16.0  . Smokeless tobacco: Never Used  Vaping Use  . Vaping Use: Never used  Substance and Sexual Activity  . Alcohol use: Yes    Comment: RARE  . Drug use: Not Currently    Types: Marijuana    Comment: "only in my teens"  . Sexual activity: Yes    Birth control/protection: Surgical

## 2020-03-12 ENCOUNTER — Other Ambulatory Visit: Payer: Self-pay | Admitting: Family Medicine

## 2020-03-12 ENCOUNTER — Telehealth: Payer: Self-pay

## 2020-03-12 MED ORDER — HUMULIN R U-500 (CONCENTRATED) 500 UNIT/ML ~~LOC~~ SOLN: 40.0000 [IU] | Freq: Three times a day (TID) | SUBCUTANEOUS | 3 refills | Status: DC

## 2020-03-12 MED ORDER — NOVOLOG FLEXPEN 100 UNIT/ML ~~LOC~~ SOPN: 40.0000 [IU] | PEN_INJECTOR | Freq: Three times a day (TID) | SUBCUTANEOUS | 3 refills | Status: DC

## 2020-03-12 NOTE — Telephone Encounter (Signed)
changed

## 2020-03-12 NOTE — Addendum Note (Signed)
Addended by: Scharlene Gloss B on: 03/12/2020 09:36 AM   Modules accepted: Orders

## 2020-03-12 NOTE — Telephone Encounter (Signed)
Humulin R not covered. Preferred alternatives: Novolog, Novolog Flexpen

## 2020-03-12 NOTE — Telephone Encounter (Signed)
OK to change, same sig. Ty.

## 2020-03-23 ENCOUNTER — Other Ambulatory Visit: Payer: Self-pay

## 2020-03-23 ENCOUNTER — Telehealth (INDEPENDENT_AMBULATORY_CARE_PROVIDER_SITE_OTHER): Payer: 59 | Admitting: Family Medicine

## 2020-03-23 ENCOUNTER — Encounter: Payer: Self-pay | Admitting: Family Medicine

## 2020-03-23 ENCOUNTER — Telehealth: Payer: Self-pay | Admitting: Family Medicine

## 2020-03-23 VITALS — BP 124/67 | HR 95 | Temp 98.5°F | Ht 62.0 in

## 2020-03-23 DIAGNOSIS — K929 Disease of digestive system, unspecified: Secondary | ICD-10-CM

## 2020-03-23 DIAGNOSIS — J4540 Moderate persistent asthma, uncomplicated: Secondary | ICD-10-CM | POA: Diagnosis not present

## 2020-03-23 DIAGNOSIS — E1169 Type 2 diabetes mellitus with other specified complication: Secondary | ICD-10-CM

## 2020-03-23 DIAGNOSIS — J383 Other diseases of vocal cords: Secondary | ICD-10-CM

## 2020-03-23 DIAGNOSIS — E669 Obesity, unspecified: Secondary | ICD-10-CM

## 2020-03-23 MED ORDER — ALBUTEROL SULFATE (2.5 MG/3ML) 0.083% IN NEBU: 2.5000 mg | INHALATION_SOLUTION | RESPIRATORY_TRACT | 0 refills | Status: DC | PRN

## 2020-03-23 NOTE — Telephone Encounter (Signed)
Completed a [employer] Patient assistance form for her insulin Humulin R 500. PCP completed all information required by MD/doctor's office.Marland Kitchen Called the patient to have them pickup///complete her personal financial information and she will then mail/or fax into the company. Did make a copy for scan.

## 2020-03-23 NOTE — Progress Notes (Addendum)
Chief Complaint  Patient presents with  . Follow-up    Discuss medications    Subjective: Patient is a 59 y.o. female here for f/u. Due to COVID-19 pandemic, we are interacting via web portal for an electronic face-to-face visit. I verified patient's ID using 2 identifiers. Patient agreed to proceed with visit via this method. Patient is at home, I am at office. Patient and I are present for visit.   Pt w hx of DM. She is on BBI, taking large mealtime doses as initiated by her former endocrinologist. Her sugars have been running in the 80-90's post prandial, she normally takes around 100 units with meals. Rare episodes of hyperglycemia that she quickly corrects. Read on insert that her Humulin R 500 causes CHF which she was dx'd w. Concerned about this. Compliant with metformin XR 1000 mg bid.   Patient has a history of moderate persistent asthma.  She has frequent exacerbations.  Her cardiologist told her that she needs to see a lung specialist which she has declined until this time.  She is ready to see one.  She reports that she wheezes and coughs frequently.  She hears wheezing from her lungs and also in her neck.  Sometimes she feels that she is gagging/gasping for air.  The patient's husband has been emptying her bedside commode and noticed that her stool contains undigested pills and food.  No new nausea or bleeding.  Past Medical History:  Diagnosis Date  . Asthma    "on daily RX and rescue inhaler" (04/18/2018)  . Chronic diastolic CHF (congestive heart failure) (HCC) 09/2015  . Chronic lower back pain   . Chronic neck pain   . Chronic pain syndrome    Fentanyl Patch  . Colon polyps   . Coronary artery disease    cath with normal LM, 30% LAD, 85% mid RCA and 95% distal RCA s/p PCI of the mid to distal RCA and now on DAPT with ASA and Ticagrelor.    . Eczema   . Excessive daytime sleepiness 11/26/2015  . Fibromyalgia   . Gallstones   . GERD (gastroesophageal reflux disease)   .  Heart murmur    "noted for the 1st time on 04/18/2018"  . History of blood transfusion 07/2010   "S/P oophorectomy"  . History of gout   . History of hiatal hernia 1980s   "gone now" (04/18/2018)  . Hyperlipidemia   . Hypertension    takes Metoprolol and Enalapril daily  . Hypothyroidism    takes Synthroid daily  . IBS (irritable bowel syndrome)   . Migraine    "nothing in the 2000s" (04/18/2018)  . Mixed connective tissue disease (HCC)   . NAFLD (nonalcoholic fatty liver disease)   . Pneumonia    "several times" (04/18/2018)  . Rheumatoid arthritis (HCC)    "hands, elbows, shoulders, probably knees" (04/18/2018)  . Scoliosis   . Spondylosis   . Type II diabetes mellitus (HCC)    takes Metformin and Hum R daily (04/18/2018)    Objective: BP 124/67 (BP Location: Left Arm, Patient Position: Sitting, Cuff Size: Normal)   Pulse 95   Temp 98.5 F (36.9 C) (Oral)   Ht 5\' 2"  (1.575 m)   BMI 53.04 kg/m  No conversational dyspnea Age appropriate judgment and insight Nml affect and mood  Assessment and Plan: Diabetes mellitus type 2 in obese (HCC)  Moderate persistent asthma, unspecified whether complicated - Plan: Ambulatory referral to Pulmonology, albuterol (PROVENTIL) (2.5 MG/3ML) 0.083% nebulizer solution,  DISCONTINUED: albuterol (PROVENTIL) (2.5 MG/3ML) 0.083% nebulizer solution  Vocal cord dysfunction - Plan: Ambulatory referral to Speech Therapy  Digestive disorder - Plan: Ambulatory referral to Gastroenterology  1-I checked with the pharmacy team and they found no evidence to support insulin causing lower extremity edema or heart failure.  I will maintain her on her current dosing but did offer to send her to an endocrinologist if she would prefer. 2-refer to pulmonology, refill albuterol nebulization solution. 3-I think the wheezing in her neck is more vocal cord dysfunction.  We will set her up with the speech team. 4-refer to GI. The patient voiced understanding and  agreement to the plan.  Jilda Roche [place], DO 03/23/20  11:15 AM

## 2020-03-24 ENCOUNTER — Other Ambulatory Visit: Payer: Self-pay

## 2020-03-24 ENCOUNTER — Ambulatory Visit (INDEPENDENT_AMBULATORY_CARE_PROVIDER_SITE_OTHER): Payer: 59 | Admitting: Physician Assistant

## 2020-03-24 ENCOUNTER — Encounter: Payer: Self-pay | Admitting: Physician Assistant

## 2020-03-24 VITALS — Ht 62.0 in | Wt 290.0 lb

## 2020-03-24 DIAGNOSIS — M86271 Subacute osteomyelitis, right ankle and foot: Secondary | ICD-10-CM

## 2020-03-24 MED ORDER — NITROGLYCERIN 0.2 MG/HR TD PT24
0.2000 mg | MEDICATED_PATCH | Freq: Every day | TRANSDERMAL | 12 refills | Status: DC
Start: 2020-03-24 — End: 2020-05-04

## 2020-03-24 NOTE — Progress Notes (Signed)
Office Visit Note   Patient: [name]           Date of Birth: Jan 04, 1961           MRN: [id number] Visit Date: 03/24/2020              Requested by: Sharlene Dory, DO [address] [place],  [place] [address] PCP: Sharlene Dory, DO  Chief Complaint  Patient presents with  . Right Foot - Routine Post Op    01/28/20 right 4th and 5th ray amputations          Hpi This is a pleasant 59 year old woman who is now 2 months status post right fourth and fifth ray amputation.  She has been somewhat slow to heal.  She admits she has been somewhat slow to heal.  She has been only placing weight on her heel  Assessment & Plan: Visit Diagnoses: No diagnosis found.  Plan: We will have a trial of nitroglycerin patches.  I demonstrated to her husband how to use them.  She has had some skin reaction to electrodes in the past so I will only give them to and they should discontinue them if they notice any rash  Follow-Up Instructions: No follow-ups on file.   Ortho Exam  Patient is alert, oriented, no adenopathy, well-dressed, normal affect, normal respiratory effort.  Surgical incision is healing there is no surrounding cellulitis swelling is well controlled.  There is no foul odor she does have some central area of slow healing in the wound.  This has improved from previous exams.  No cellulitis or evidence of infection   Imaging: No results found. No images are attached to the encounter.  Labs: Lab Results  Component Value Date   HGBA1C 9.0 (H) 01/28/2020   HGBA1C 9.1 (H) 08/15/2019   HGBA1C 8.4 (H) 01/02/2018   ESRSEDRATE 37 (H) 01/02/2018   CRP 9.8 (H) 01/02/2018   REPTSTATUS 02/03/2020 FINAL 01/28/2020   GRAMSTAIN  01/28/2020    ABUNDANT WBC PRESENT,BOTH PMN AND MONONUCLEAR ABUNDANT GRAM POSITIVE COCCI ABUNDANT GRAM NEGATIVE RODS FEW GRAM POSITIVE RODS    CULT  01/28/2020    FEW PROTEUS MIRABILIS FEW STAPHYLOCOCCUS AUREUS FEW  ENTEROCOCCUS FAECALIS ABUNDANT BACTEROIDES SPECIES BETA LACTAMASE NEGATIVE Performed at [facility], [address] [place], [place] [address]    LABORGA PROTEUS MIRABILIS 01/28/2020   LABORGA STAPHYLOCOCCUS AUREUS 01/28/2020   LABORGA ENTEROCOCCUS FAECALIS 01/28/2020     Lab Results  Component Value Date   ALBUMIN 3.2 (L) 01/21/2020   ALBUMIN 3.8 08/01/2019   ALBUMIN 3.3 (L) 10/19/2018    Lab Results  Component Value Date   MG 2.2 10/20/2018   MG 2.2 10/19/2018   MG 1.2 (L) 10/17/2018   Lab Results  Component Value Date   VD25OH 69.04 07/25/2017    No results found for: PREALBUMIN CBC EXTENDED Latest Ref Rng & Units 01/31/2020 01/21/2020 01/21/2020  WBC 4.0 - 10.5 K/uL 9.1 - 9.2  RBC 3.87 - 5.11 MIL/uL 4.11 - 4.57  HGB 12.0 - 15.0 g/dL 10.1(L) 12.6 11.4(L)  HCT 36 - 46 % 34.6(L) 37.0 37.7  PLT 150 - 400 K/uL 340 - 369  NEUTROABS 1.7 - 7.7 K/uL - - 5.9  LYMPHSABS 0.7 - 4.0 K/uL - - 2.0     Body mass index is 53.04 kg/m.  Orders:  No orders of the defined types were placed in this encounter.  Meds ordered this encounter  Medications  . nitroGLYCERIN (NITRODUR - DOSED IN MG/24 HR) 0.2 mg/hr patch    Sig: Place 1 patch (0.2 mg total) onto the skin daily.    Dispense:  2 patch    Refill:  12     Procedures: No procedures performed  Clinical Data: No additional findings.  ROS:  All other systems negative, except as noted in the HPI. Review of Systems  Objective: Vital Signs: Ht 5\' 2"  (1.575 m)   Wt 290 lb (131.5 kg)   BMI 53.04 kg/m   Specialty Comments:  No specialty comments available.  PMFS History: Patient Active Problem List   Diagnosis Date Noted  . Sjogren's syndrome (HCC) 02/11/2020  . Acute hematogenous osteomyelitis of right foot (HCC) 01/28/2020  . Cutaneous abscess of right foot   . Subacute osteomyelitis, right ankle and foot (HCC)   . Statin intolerance 08/16/2019  . PAF (paroxysmal atrial fibrillation) (HCC) 01/06/2019    . Gram-negative bacteremia 10/18/2018  . Streptococcal bacteremia 10/18/2018  . Ulcer of lower extremity, limited to breakdown of skin (HCC) 10/03/2018  . CAD S/P percutaneous coronary angioplasty 04/19/2018  . Essential hypertension   . Moderate persistent asthma 03/21/2018  . NAFLD (nonalcoholic fatty liver disease)   . Recurrent cellulitis of lower extremity 01/02/2018  . Cellulitis of lower leg 01/02/2018  . Chronic diarrhea 05/08/2017  . H/O Clostridium difficile infection 05/08/2017  . Rectal bleeding 05/08/2017  . Hyperbilirubinemia 04/29/2016  . Hyponatremia 04/29/2016  . Cellulitis of leg, right 03/09/2016  . Mild persistent asthma 02/15/2016  . Allergic rhinitis due to pollen 02/15/2016  . Anaphylactic reaction due to food 02/10/2016  . Diabetes mellitus type 2 in obese (HCC) 02/10/2016  . Coronary artery disease involving native coronary artery of native heart without angina pectoris 02/10/2016  . Gastroesophageal reflux disease without esophagitis 02/10/2016  . Atopic eczema 02/10/2016  . Cervical nerve root disorder 12/15/2015  . Lumbar radiculopathy 12/15/2015  . Daytime somnolence 11/26/2015  . Venous stasis dermatitis of both lower extremities 02/11/2015  . Morbid obesity (HCC) 06/19/2014  . B-complex deficiency 03/26/2014  . Benign essential HTN 03/26/2014  . Chronic pain associated with significant psychosocial dysfunction 03/26/2014  . Diaphragmatic hernia 03/26/2014  . Gastroesophageal reflux disease 03/26/2014  . Hammer toe 03/26/2014  . H/O neoplasm 03/26/2014  . 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 Diabetic polyneuropathy (HCC) 03/26/2014  . Deafness, sensorineural 03/26/2014  . Fibromyalgia 01/20/2014  . 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 Insulin dependent diabetes mellitus (HCC) 11/11/2012  . Hyperlipidemia LDL goal <70 11/11/2012  . Mixed connective tissue disease (HCC) 11/11/2012  . Hypothyroidism 11/11/2012  . Uncomplicated asthma  11/11/2012  . 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HCC) 02/20/2012  . 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 ANA positive 07/17/2011   Past Medical History:  Diagnosis Date  . Asthma    "on daily RX and rescue inhaler" (04/18/2018)  . Chronic diastolic CHF (congestive heart failure) (HCC) 09/2015  . Chronic lower back pain   . Chronic neck pain   . Chronic pain syndrome    Fentanyl Patch  . Colon polyps   . Coronary artery disease    cath with normal LM, 30% LAD, 85% mid RCA and 95% distal RCA s/p PCI of the mid to distal RCA and now on DAPT with ASA and Ticagrelor.    . Eczema   . Excessive daytime sleepiness 11/26/2015  . Fibromyalgia   . Gallstones   .  GERD (gastroesophageal reflux disease)   . Heart murmur    "noted for the 1st time on 04/18/2018"  . History of blood transfusion 07/2010   "S/P oophorectomy"  . History of gout   . History of hiatal hernia 1980s   "gone now" (04/18/2018)  . Hyperlipidemia   . Hypertension    takes Metoprolol and Enalapril daily  . Hypothyroidism    takes Synthroid daily  . IBS (irritable bowel syndrome)   . Migraine    "nothing in the 2000s" (04/18/2018)  . Mixed connective tissue disease (HCC)   . NAFLD (nonalcoholic fatty liver disease)   . Pneumonia    "several times" (04/18/2018)  . Rheumatoid arthritis (HCC)    "hands, elbows, shoulders, probably knees" (04/18/2018)  . Scoliosis   . Spondylosis   . Type II diabetes mellitus (HCC)    takes Metformin and Hum R daily (04/18/2018)    Family History  Problem Relation Age of Onset  . CAD Mother   . Hypertension Mother   . Heart attack Mother   . CAD Father   . Heart attack Father   . Allergic rhinitis Father   . Asthma Father   . Hypertension Brother   . Hypertension Brother   . Pancreatic cancer Paternal Aunt   . Breast cancer Paternal Aunt   . Lung cancer Paternal Aunt     Past Surgical History:  Procedure Laterality  Date  . ABDOMINAL HYSTERECTOMY  06/2005   "w/right ovariy"  . AMPUTATION Right 01/28/2020    RIGHT FOURTH AND FIFTH RAY AMPUTATION  . AMPUTATION Right 01/28/2020   Procedure: RIGHT FOURTH AND FIFTH RAY AMPUTATION,;  Surgeon: [name], MD;  Location: [facility] OR;  Service: Orthopedics;  Laterality: Right;  . APPENDECTOMY    . BREAST BIOPSY Bilateral    9 total (04/18/2018)  . CORONARY ANGIOPLASTY WITH STENT PLACEMENT  10/01/2015   normal LM, 30% LAD, 85% mid RCA and 95% distal RCA s/p PCI of the mid to distal RCA and now on DAPT with ASA and Ticagrelor.    . CORONARY STENT INTERVENTION Right 04/18/2018   Procedure: CORONARY STENT INTERVENTION;  Surgeon: [name], MD;  Location: [facility];  Service: Cardiovascular;  Laterality: Right;  . DILATION AND CURETTAGE OF UTERUS    . FRACTURE SURGERY    . LAPAROSCOPIC CHOLECYSTECTOMY    . LEFT HEART CATH AND CORONARY ANGIOGRAPHY N/A 04/18/2018   Procedure: LEFT HEART CATH AND CORONARY ANGIOGRAPHY;  Surgeon: [name], MD;  Location: [facility];  Service: Cardiovascular;  Laterality: N/A;  . MUSCLE BIOPSY Left    "leg"  . OOPHORECTOMY  07/2010  . RADIOLOGY WITH ANESTHESIA N/A 05/25/2016   Procedure: RADIOLOGY WITH ANESTHESIA;  Surgeon: Medication Radiologist, MD;  Location: [facility] OR;  Service: Radiology;  Laterality: N/A;  . TONSILLECTOMY AND ADENOIDECTOMY    . WRIST FRACTURE SURGERY Left    "crushed it"   Social History   Occupational History  . Not on file  Tobacco Use  . Smoking status: Former Smoker    Packs/day: 1.00    Years: 20.00    Pack years: 20.00    Types: Cigarettes    Quit date: 02/11/2004    Years since quitting: 16.1  . Smokeless tobacco: Never Used  Vaping Use  . Vaping Use: Never used  Substance and Sexual Activity  . Alcohol use: Yes    Comment: RARE  . Drug use: Not Currently    Types:  Marijuana    Comment: "only in my teens"  . Sexual activity: Yes    Birth  control/protection: Surgical

## 2020-03-25 ENCOUNTER — Telehealth: Payer: Self-pay | Admitting: Physician Assistant

## 2020-03-25 ENCOUNTER — Telehealth: Payer: Self-pay | Admitting: Gastroenterology

## 2020-03-25 NOTE — Telephone Encounter (Signed)
Added note for Ceclia from [facility]. Verbal Orders are for physical therapy for patient

## 2020-03-25 NOTE — Telephone Encounter (Signed)
Cecila (Physical Therapist) from [facility] called to inform [name] that patient blood pressure was elevated today. Patient states not to be suffering from dizziness,headaches, nor chest pains. Cecila is also asking for verbal orders. I week 1 for next week and I week for 4 weeks starting 04/06/20. Cecila phone number is [phone]. Please call to confirm.

## 2020-03-25 NOTE — Telephone Encounter (Signed)
Per patient request, OK with me.

## 2020-03-26 NOTE — Telephone Encounter (Signed)
Patient's physical therapist was called and given verbal orders. Cecila understood.

## 2020-03-29 NOTE — Telephone Encounter (Signed)
Dr. Barron Alvine, will you accept this patient?

## 2020-03-30 ENCOUNTER — Encounter: Payer: Self-pay | Admitting: Gastroenterology

## 2020-03-30 NOTE — Telephone Encounter (Signed)
Ok with me. Happy to see her in [facility]. Thanks.

## 2020-04-02 ENCOUNTER — Telehealth: Payer: Self-pay | Admitting: Family Medicine

## 2020-04-02 NOTE — Telephone Encounter (Signed)
Dorathy Daft- [facility] [phone]   Requesting verbal order for OT   1 time a week for 4weeks

## 2020-04-02 NOTE — Telephone Encounter (Signed)
Spoke with [facility]).  [name] reports patient has cough x 1 week, no relief from cough syrup. Respiratory rate 20/min at rest, increases to 28/min while standing. Patient also nauseated and dizzy with standing. BP 150/60, HR 94, Pox 98% on room air.  [name] to have patient evaluated at [facility], verbalized understanding.

## 2020-04-06 ENCOUNTER — Other Ambulatory Visit: Payer: Self-pay | Admitting: Cardiology

## 2020-04-06 ENCOUNTER — Other Ambulatory Visit: Payer: Self-pay | Admitting: Family Medicine

## 2020-04-06 ENCOUNTER — Telehealth: Payer: Self-pay

## 2020-04-06 ENCOUNTER — Telehealth: Payer: Self-pay | Admitting: Physician Assistant

## 2020-04-06 DIAGNOSIS — J45909 Unspecified asthma, uncomplicated: Secondary | ICD-10-CM

## 2020-04-06 NOTE — Telephone Encounter (Signed)
Does Novolog have a higher concentrated option?

## 2020-04-06 NOTE — Telephone Encounter (Signed)
I signed off on a substitution. Does Novolog have a concentrated equivalent? Ty.

## 2020-04-06 NOTE — Telephone Encounter (Signed)
Received call from [facility] with [facility] needing a correct code for Home Health Skilled Nursing. Audelia Hives said the code [id number] is incorrect. The number to contact [facility] is [phone]    Auth# [id number]

## 2020-04-06 NOTE — Telephone Encounter (Signed)
She is on Novology flexpen already on her list?

## 2020-04-06 NOTE — Telephone Encounter (Signed)
Drat. OK to send Novolog w prev sig. Ty.

## 2020-04-06 NOTE — Telephone Encounter (Signed)
Called the [facility] pharmacy [facility]) and they do not have this

## 2020-04-06 NOTE — Telephone Encounter (Signed)
Humulin R not covered by [employer]. Novolog and Novolog Flexpen is preferred.

## 2020-04-06 NOTE — Telephone Encounter (Signed)
Called back to the pharmacy and pharmacist said no it does not

## 2020-04-07 ENCOUNTER — Encounter: Payer: Self-pay | Admitting: Physician Assistant

## 2020-04-07 ENCOUNTER — Other Ambulatory Visit: Payer: Self-pay | Admitting: Cardiology

## 2020-04-07 ENCOUNTER — Other Ambulatory Visit: Payer: Self-pay

## 2020-04-07 ENCOUNTER — Ambulatory Visit (INDEPENDENT_AMBULATORY_CARE_PROVIDER_SITE_OTHER): Payer: 59 | Admitting: Physician Assistant

## 2020-04-07 VITALS — Ht 62.0 in | Wt 290.0 lb

## 2020-04-07 DIAGNOSIS — Z89421 Acquired absence of other right toe(s): Secondary | ICD-10-CM

## 2020-04-07 NOTE — Telephone Encounter (Signed)
I called and spoke to [facility] from [facility] and he clarified the requested CPT code is G-0299 for direct skilled nursing. It is under clinician review and awaiting for approval for [facility] services.

## 2020-04-07 NOTE — Addendum Note (Signed)
Addended by: Radene Gunning on: 04/07/2020 07:02 AM   Modules accepted: Orders

## 2020-04-07 NOTE — Progress Notes (Signed)
Post-Op Visit Note   Patient: [name]           Date of Birth: Feb 07, 1961           MRN: [id number] Visit Date: 04/07/2020 PCP: Sharlene Dory, DO  Chief Complaint:  Chief Complaint  Patient presents with  . Right Foot - Routine Post Op    01/28/20 right 4th and 5th ray amputation    HPI:  HPI The patient is a 59 year old female woman who presents today for postoperative follow-up.  She status post right fourth and fifth ray amputations and has been quite slow to heal.  They have used Silvadene some in the past but have currently been doing dry dressing changes she has been doing some transfers through the heel.  Home health working with her as well as physical therapy  Ortho Exam Overall her foot has very minimal edema very mild erythema dorsally some dermatitis which appears to be from the nitroglycerin patches.  The incision is healing slowly there had been some eschar which is improved.  There is a 3 cm length of fibrinous exudative tissue.  There is no surrounding maceration no active drainage no odor.  Having some tenderness to the third metatarsal head  Visit Diagnoses:  1. 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  Discussed using Silvadene once or twice a week for dressing changes continue to cleanse it daily.  She will follow-up in 2 weeks.  Discussed return precautions looking out for signs of infection  Follow-Up Instructions: Return in about 2 weeks (around 04/21/2020).   Imaging: No results found.  Orders:  No orders of the defined types were placed in this encounter.  No orders of the defined types were placed in this encounter.    PMFS History: Patient Active Problem List   Diagnosis Date Noted  . Sjogren's syndrome (HCC) 02/11/2020  . Acute hematogenous osteomyelitis of right foot (HCC) 01/28/2020  . Cutaneous abscess of right foot   . Subacute osteomyelitis, right  ankle and foot (HCC)   . Statin intolerance 08/16/2019  . PAF (paroxysmal atrial fibrillation) (HCC) 01/06/2019  . Gram-negative bacteremia 10/18/2018  . Streptococcal bacteremia 10/18/2018  . Ulcer of lower extremity, limited to breakdown of skin (HCC) 10/03/2018  . CAD S/P percutaneous coronary angioplasty 04/19/2018  . Essential hypertension   . Moderate persistent asthma 03/21/2018  . NAFLD (nonalcoholic fatty liver disease)   . Recurrent cellulitis of lower extremity 01/02/2018  . Cellulitis of lower leg 01/02/2018  . Chronic diarrhea 05/08/2017  . H/O Clostridium difficile infection 05/08/2017  . Rectal bleeding 05/08/2017  . Hyperbilirubinemia 04/29/2016  . Hyponatremia 04/29/2016  . Cellulitis of leg, right 03/09/2016  . Mild persistent asthma 02/15/2016  . Allergic rhinitis due to pollen 02/15/2016  . Anaphylactic reaction due to food 02/10/2016  . Diabetes mellitus type 2 in obese (HCC) 02/10/2016  . Coronary artery disease involving native coronary artery of native heart without angina pectoris 02/10/2016  . Gastroesophageal reflux disease without esophagitis 02/10/2016  . Atopic eczema 02/10/2016  . Cervical nerve root disorder 12/15/2015  . Lumbar radiculopathy 12/15/2015  . Daytime somnolence 11/26/2015  . Venous stasis dermatitis of both lower extremities 02/11/2015  . Morbid obesity (HCC) 06/19/2014  . B-complex deficiency 03/26/2014  . Benign essential HTN 03/26/2014  . Chronic pain associated with significant psychosocial dysfunction 03/26/2014  . Diaphragmatic hernia 03/26/2014  . Gastroesophageal reflux  disease 03/26/2014  . Hammer toe 03/26/2014  . H/O neoplasm 03/26/2014  . 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 Diabetic polyneuropathy (HCC) 03/26/2014  . Deafness, sensorineural 03/26/2014  . Fibromyalgia 01/20/2014  . 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 Insulin dependent diabetes mellitus (HCC) 11/11/2012  . Hyperlipidemia LDL goal <70  11/11/2012  . Mixed connective tissue disease (HCC) 11/11/2012  . Hypothyroidism 11/11/2012  . Uncomplicated asthma 11/11/2012  . 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HCC) 02/20/2012  . 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 ANA positive 07/17/2011   Past Medical History:  Diagnosis Date  . Asthma    "on daily RX and rescue inhaler" (04/18/2018)  . Chronic diastolic CHF (congestive heart failure) (HCC) 09/2015  . Chronic lower back pain   . Chronic neck pain   . Chronic pain syndrome    Fentanyl Patch  . Colon polyps   . Coronary artery disease    cath with normal LM, 30% LAD, 85% mid RCA and 95% distal RCA s/p PCI of the mid to distal RCA and now on DAPT with ASA and Ticagrelor.    . Eczema   . Excessive daytime sleepiness 11/26/2015  . Fibromyalgia   . Gallstones   . GERD (gastroesophageal reflux disease)   . Heart murmur    "noted for the 1st time on 04/18/2018"  . History of blood transfusion 07/2010   "S/P oophorectomy"  . History of gout   . History of hiatal hernia 1980s   "gone now" (04/18/2018)  . Hyperlipidemia   . Hypertension    takes Metoprolol and Enalapril daily  . Hypothyroidism    takes Synthroid daily  . IBS (irritable bowel syndrome)   . Migraine    "nothing in the 2000s" (04/18/2018)  . Mixed connective tissue disease (HCC)   . NAFLD (nonalcoholic fatty liver disease)   . Pneumonia    "several times" (04/18/2018)  . Rheumatoid arthritis (HCC)    "hands, elbows, shoulders, probably knees" (04/18/2018)  . Scoliosis   . Spondylosis   . Type II diabetes mellitus (HCC)    takes Metformin and Hum R daily (04/18/2018)    Family History  Problem Relation Age of Onset  . CAD Mother   . Hypertension Mother   . Heart attack Mother   . CAD Father   . Heart attack Father   . Allergic rhinitis Father   . Asthma Father   . Hypertension Brother   . Hypertension Brother   . Pancreatic cancer Paternal Aunt    . Breast cancer Paternal Aunt   . Lung cancer Paternal Aunt     Past Surgical History:  Procedure Laterality Date  . ABDOMINAL HYSTERECTOMY  06/2005   "w/right ovariy"  . AMPUTATION Right 01/28/2020    RIGHT FOURTH AND FIFTH RAY AMPUTATION  . AMPUTATION Right 01/28/2020   Procedure: RIGHT FOURTH AND FIFTH RAY AMPUTATION,;  Surgeon: [name], MD;  Location: [facility] OR;  Service: Orthopedics;  Laterality: Right;  . APPENDECTOMY    . BREAST BIOPSY Bilateral    9 total (04/18/2018)  . CORONARY ANGIOPLASTY WITH STENT PLACEMENT  10/01/2015   normal LM, 30% LAD, 85% mid RCA and 95% distal RCA s/p PCI of the mid to distal RCA and now on DAPT with ASA and Ticagrelor.    . CORONARY STENT INTERVENTION Right 04/18/2018   Procedure: CORONARY STENT INTERVENTION;  Surgeon: [name], MD;  Location: [facility];  Service: Cardiovascular;  Laterality: Right;  . DILATION AND CURETTAGE OF UTERUS    . FRACTURE SURGERY    . LAPAROSCOPIC CHOLECYSTECTOMY    . LEFT HEART CATH AND CORONARY ANGIOGRAPHY N/A 04/18/2018   Procedure: LEFT HEART CATH AND CORONARY ANGIOGRAPHY;  Surgeon: [name], MD;  Location: [facility];  Service: Cardiovascular;  Laterality: N/A;  . MUSCLE BIOPSY Left    "leg"  . OOPHORECTOMY  07/2010  . RADIOLOGY WITH ANESTHESIA N/A 05/25/2016   Procedure: RADIOLOGY WITH ANESTHESIA;  Surgeon: Medication Radiologist, MD;  Location: [facility] OR;  Service: Radiology;  Laterality: N/A;  . TONSILLECTOMY AND ADENOIDECTOMY    . WRIST FRACTURE SURGERY Left    "crushed it"   Social History   Occupational History  . Not on file  Tobacco Use  . Smoking status: Former Smoker    Packs/day: 1.00    Years: 20.00    Pack years: 20.00    Types: Cigarettes    Quit date: 02/11/2004    Years since quitting: 16.1  . Smokeless tobacco: Never Used  Vaping Use  . Vaping Use: Never used  Substance and Sexual Activity  . Alcohol use: Yes    Comment: RARE  . Drug use: Not  Currently    Types: Marijuana    Comment: "only in my teens"  . Sexual activity: Yes    Birth control/protection: Surgical

## 2020-04-08 ENCOUNTER — Telehealth: Payer: Self-pay

## 2020-04-19 ENCOUNTER — Encounter: Payer: Self-pay | Admitting: Family Medicine

## 2020-04-19 ENCOUNTER — Other Ambulatory Visit: Payer: Self-pay

## 2020-04-19 ENCOUNTER — Telehealth (INDEPENDENT_AMBULATORY_CARE_PROVIDER_SITE_OTHER): Payer: 59 | Admitting: Family Medicine

## 2020-04-19 VITALS — BP 114/71 | HR 104 | Temp 98.8°F | Wt 290.0 lb

## 2020-04-19 DIAGNOSIS — T464X5A Adverse effect of angiotensin-converting-enzyme inhibitors, initial encounter: Secondary | ICD-10-CM | POA: Diagnosis not present

## 2020-04-19 DIAGNOSIS — E1169 Type 2 diabetes mellitus with other specified complication: Secondary | ICD-10-CM | POA: Diagnosis not present

## 2020-04-19 DIAGNOSIS — E669 Obesity, unspecified: Secondary | ICD-10-CM | POA: Diagnosis not present

## 2020-04-19 DIAGNOSIS — R058 Other specified cough: Secondary | ICD-10-CM

## 2020-04-19 DIAGNOSIS — R05 Cough: Secondary | ICD-10-CM

## 2020-04-19 MED ORDER — HUMULIN R U-500 (CONCENTRATED) 500 UNIT/ML ~~LOC~~ SOLN: 40.0000 [IU] | Freq: Three times a day (TID) | SUBCUTANEOUS | 2 refills | Status: DC

## 2020-04-19 MED ORDER — METFORMIN HCL 500 MG PO TABS: 500.0000 mg | ORAL_TABLET | Freq: Two times a day (BID) | ORAL | 3 refills | Status: DC

## 2020-04-19 MED ORDER — VALSARTAN 160 MG PO TABS: 160.0000 mg | ORAL_TABLET | Freq: Every day | ORAL | 2 refills | Status: DC

## 2020-04-19 NOTE — Progress Notes (Signed)
Chief Complaint  Patient presents with  . Follow-up    medications    Subjective: Patient is a 59 y.o. female here for f/u. Due to COVID-19 pandemic, we are interacting via web portal for an electronic face-to-face visit. I verified patient's ID using 2 identifiers. Patient agreed to proceed with visit via this method. Patient is at home, I am at office. Patient and I are present for visit.   Patient has been dealing with a dry cough.  She is taking enalapril daily.  She would like to know if changing this medication would benefit her cough.  She also has a history of heart failure and asthma.  Patient is an insulin-dependent diabetic.  Interested in seeing endocrinology.  She was told that a prior authorization would allow her to obtain U5 100 Humulin.  Requesting refill.  She also has been having bowel issues where she will see her medication in her stool.  She is on extended release Metformin at this time and is interested in a shorter acting release version.  Past Medical History:  Diagnosis Date  . Asthma    "on daily RX and rescue inhaler" (04/18/2018)  . Chronic diastolic CHF (congestive heart failure) (HCC) 09/2015  . Chronic lower back pain   . Chronic neck pain   . Chronic pain syndrome    Fentanyl Patch  . Colon polyps   . Coronary artery disease    cath with normal LM, 30% LAD, 85% mid RCA and 95% distal RCA s/p PCI of the mid to distal RCA and now on DAPT with ASA and Ticagrelor.    . Eczema   . Excessive daytime sleepiness 11/26/2015  . Fibromyalgia   . Gallstones   . GERD (gastroesophageal reflux disease)   . Heart murmur    "noted for the 1st time on 04/18/2018"  . History of blood transfusion 07/2010   "S/P oophorectomy"  . History of gout   . History of hiatal hernia 1980s   "gone now" (04/18/2018)  . Hyperlipidemia   . Hypertension    takes Metoprolol and Enalapril daily  . Hypothyroidism    takes Synthroid daily  . IBS (irritable bowel syndrome)   . Migraine     "nothing in the 2000s" (04/18/2018)  . Mixed connective tissue disease (HCC)   . NAFLD (nonalcoholic fatty liver disease)   . Pneumonia    "several times" (04/18/2018)  . Rheumatoid arthritis (HCC)    "hands, elbows, shoulders, probably knees" (04/18/2018)  . Scoliosis   . Spondylosis   . Type II diabetes mellitus (HCC)    takes Metformin and Hum R daily (04/18/2018)    Objective: BP 114/71 (BP Location: Left Arm, Patient Position: Sitting, Cuff Size: Normal)   Pulse (!) 104   Temp 98.8 F (37.1 C) (Oral)   Wt 290 lb (131.5 kg)   SpO2 98%   BMI 53.04 kg/m  No conversational dyspnea Age appropriate judgment and insight Nml affect and mood  Assessment and Plan: ACE-inhibitor cough - Plan: valsartan (DIOVAN) 160 MG tablet  Diabetes mellitus type 2 in obese (HCC) - Plan: insulin regular human CONCENTRATED (HUMULIN R) 500 UNIT/ML injection, Ambulatory referral to Endocrinology, metFORMIN (GLUCOPHAGE) 500 MG tablet  1.  Change enalapril to valsartan.  Monitor blood pressure at home. 2.  We will resend Humulin.  Endo referral placed.  Change Metformin XR to immediate release.  It has been nearly 20 years since she has been on this formulation and she does not remember any  adverse effects. Follow-up in 4 weeks weeks to recheck. The patient voiced understanding and agreement to the plan.  Jilda Roche [place], DO 04/19/20  12:01 PM

## 2020-04-21 ENCOUNTER — Telehealth: Payer: Self-pay | Admitting: Family Medicine

## 2020-04-21 ENCOUNTER — Ambulatory Visit: Payer: 59 | Admitting: Physician Assistant

## 2020-04-21 NOTE — Telephone Encounter (Signed)
PA for Humulin R done on 04/06/2020 and denied. Notes below from PA note on 04/06/2020 Humulin R not covered by [employer]. Novolog and Novolog Flexpen is preferred.  Have let the patient know.  See telelphone note dated 04/06/2020. The patient will contact her pharmacy regarding this.

## 2020-04-21 NOTE — Telephone Encounter (Signed)
Patient is wondering if prior authorization  has been completed for medication (insulin regular human CONCENTRATED (HUMULIN R) 500 UNIT/ML injection [[id number]]   Please Advise

## 2020-04-23 ENCOUNTER — Other Ambulatory Visit: Payer: Self-pay | Admitting: Family Medicine

## 2020-04-23 ENCOUNTER — Telehealth: Payer: Self-pay | Admitting: Family Medicine

## 2020-04-23 DIAGNOSIS — E1169 Type 2 diabetes mellitus with other specified complication: Secondary | ICD-10-CM

## 2020-04-23 MED ORDER — NOVOLOG FLEXPEN 100 UNIT/ML ~~LOC~~ SOPN: 40.0000 [IU] | PEN_INJECTOR | Freq: Three times a day (TID) | SUBCUTANEOUS | 2 refills | Status: DC

## 2020-04-23 NOTE — Telephone Encounter (Signed)
Due to the need to do an urgent non formulary request the agent I spoke to said we only would have a 24 hour time to complete it and if was not completed in that time frame it would be denied. The Agent suggested to initiate the PA on Monday morning.

## 2020-04-23 NOTE — Telephone Encounter (Signed)
Let's go forth with the auth. Let's get her some samples please. TY.

## 2020-04-23 NOTE — Telephone Encounter (Signed)
She said that pens are better. She said we need to file a non formulary urgent request. [phone] Pens would be easier for her. This weekend she will have no insulin and wants advisement on what to do until this can be completed.

## 2020-04-23 NOTE — Telephone Encounter (Signed)
Patient states she needs her medication below she is out. Please call her insurance([employer])  and states the following; Call #  [phone] Urgent Request Non formailty prior authorization  So that insurance company will approve the medication.    insulin regular human CONCENTRATED (HUMULIN R) 500 UNIT/ML

## 2020-04-23 NOTE — Telephone Encounter (Signed)
Patients husband will pickup samples/2 boxes humalog pen. Called to initiate PA But due to time restraint will start PA on monday

## 2020-04-23 NOTE — Telephone Encounter (Signed)
The PA was done on this medication the end of July and was denied. You can go back and look at telephone note dated 04/06/2020. So not sure what else to do.

## 2020-04-23 NOTE — Telephone Encounter (Signed)
Plz let pt know and see if there is another specific formulation she would like [place] to try. Ty.

## 2020-04-24 ENCOUNTER — Other Ambulatory Visit: Payer: Self-pay | Admitting: Family Medicine

## 2020-04-26 NOTE — Progress Notes (Signed)
CARDIOLOGY OFFICE NOTE  Date:  05/04/2020    [name] Date of Birth: Oct 18, 1960 Medical Record #[id number]  PCP:  Sharlene Dory, DO  Cardiologist:  Mayford Knife  Chief Complaint  Patient presents with  . Follow-up    Seen for Dr. Mayford Knife    History of Present Illness: [name] is a 59 y.o. female who presents today for a follow up visit. Seen for Dr. Mayford Knife.   She has a history of known CAD with prior PCI/DES to the mid distal RCA August 2019 with residual 30% LAD.  Previously placed stents in the mid and distal RCA were patent.  Other issues include PAF with RVR in the setting of sepsis and bacteremia - converted to normal sinus rhythm. CHADSVASC is 5 - but not anticoagulated because of the very brief nature of her atrial fibrillation and acute infection. Apparently with several auto immune diseases per her report.   Last seen by Dr. Mayford Knife by telehealth visit in July of 2020. Event monitor was ordered at that time.  Event monitor showed normal sinus rhythm with occasional nonsustained atrial tachycardia so Dr. Mayford Knife increased her Cardizem to 300 mg daily.  Last seen by telehealth visit in August by Jacolyn Reedy, PA. Felt to be doing well.   Comes in today. Here with her husband. In a wheelchair. Lots of issues. Has had recent partial foot amputation. Notes significant cough back in December - ended up in the ER - while there - her foot was noted to be necrotic. She is diabetic - not controlled. She had been on numerous courses of steroids apparently given her autoimmune issues. Still with some cough. Sounds like she was on ACE in the past.  Has been switched over to ARB in the last week - cough is now improving - almost resolved. BP ok here today. Wondering if now too low. Not really dizzy or lightheaded. Was placed on doxycycline this past Friday - has had more foot pain and seeing Dr. Lajoyce Corners later today. Concern that her amputation is not healing. Sugars still  running high. Some concern about passing pills in her stool and if there is actual absorption. Not able to exercise - can't weight bear at this time. Not on any cholesterol medicine. Says no one ever reached out to her about her lipids. She has chest pain - never knows what it is from - GERD/costochondritis/heart, etc. She has not been vaccinated for COVID 19 - said she has been told "to wait". She notes lots of wheezing at times - seeing pulmonary next month. Some tachycardia at times but does not sound like it is too bothersome for her.   Past Medical History:  Diagnosis Date  . Asthma    "on daily RX and rescue inhaler" (04/18/2018)  . Chronic diastolic CHF (congestive heart failure) (HCC) 09/2015  . Chronic lower back pain   . Chronic neck pain   . Chronic pain syndrome    Fentanyl Patch  . Colon polyps   . Coronary artery disease    cath with normal LM, 30% LAD, 85% mid RCA and 95% distal RCA s/p PCI of the mid to distal RCA and now on DAPT with ASA and Ticagrelor.    . Eczema   . Excessive daytime sleepiness 11/26/2015  . Fibromyalgia   . Gallstones   . GERD (gastroesophageal reflux disease)   . Heart murmur    "noted for the 1st time on 04/18/2018"  . History  of blood transfusion 07/2010   "S/P oophorectomy"  . History of gout   . History of hiatal hernia 1980s   "gone now" (04/18/2018)  . Hyperlipidemia   . Hypertension    takes Metoprolol and Enalapril daily  . Hypothyroidism    takes Synthroid daily  . IBS (irritable bowel syndrome)   . Migraine    "nothing in the 2000s" (04/18/2018)  . Mixed connective tissue disease (HCC)   . NAFLD (nonalcoholic fatty liver disease)   . Pneumonia    "several times" (04/18/2018)  . Rheumatoid arthritis (HCC)    "hands, elbows, shoulders, probably knees" (04/18/2018)  . Scoliosis   . Spondylosis   . Type II diabetes mellitus (HCC)    takes Metformin and Hum R daily (04/18/2018)    Past Surgical History:  Procedure Laterality Date  .  ABDOMINAL HYSTERECTOMY  06/2005   "w/right ovariy"  . AMPUTATION Right 01/28/2020    RIGHT FOURTH AND FIFTH RAY AMPUTATION  . AMPUTATION Right 01/28/2020   Procedure: RIGHT FOURTH AND FIFTH RAY AMPUTATION,;  Surgeon: [name], MD;  Location: [facility];  Service: Orthopedics;  Laterality: Right;  . APPENDECTOMY    . BREAST BIOPSY Bilateral    9 total (04/18/2018)  . CORONARY ANGIOPLASTY WITH STENT PLACEMENT  10/01/2015   normal LM, 30% LAD, 85% mid RCA and 95% distal RCA s/p PCI of the mid to distal RCA and now on DAPT with ASA and Ticagrelor.    . CORONARY STENT INTERVENTION Right 04/18/2018   Procedure: CORONARY STENT INTERVENTION;  Surgeon: [name], MD;  Location: [facility] INVASIVE CV LAB;  Service: Cardiovascular;  Laterality: Right;  . DILATION AND CURETTAGE OF UTERUS    . FRACTURE SURGERY    . LAPAROSCOPIC CHOLECYSTECTOMY    . LEFT HEART CATH AND CORONARY ANGIOGRAPHY N/A 04/18/2018   Procedure: LEFT HEART CATH AND CORONARY ANGIOGRAPHY;  Surgeon: [name], MD;  Location: [facility] INVASIVE CV [facility];  Service: Cardiovascular;  Laterality: N/A;  . MUSCLE BIOPSY Left    "leg"  . OOPHORECTOMY  07/2010  . RADIOLOGY WITH ANESTHESIA N/A 05/25/2016   Procedure: RADIOLOGY WITH ANESTHESIA;  Surgeon: Medication Radiologist, MD;  Location: [facility] OR;  Service: Radiology;  Laterality: N/A;  . TONSILLECTOMY AND ADENOIDECTOMY    . WRIST FRACTURE SURGERY Left    "crushed it"     Medications: Current Meds  Medication Sig  . acetaminophen (TYLENOL) 650 MG CR tablet Take 1,300 mg by mouth in the morning and at bedtime.  Marland Kitchen albuterol (PROVENTIL) (2.5 MG/3ML) 0.083% nebulizer solution Take 3 mLs (2.5 mg total) by nebulization every 4 (four) hours as needed for wheezing or shortness of breath.  Marland Kitchen aspirin EC 81 MG tablet Take 81 mg by mouth daily.  Marland Kitchen augmented betamethasone dipropionate (DIPROLENE-AF) 0.05 % cream APPLY 1 CREAM TOPICALLY ONCE DAILY AS NEEDED FOR  ECZEMA  .  clotrimazole-betamethasone (LOTRISONE) cream Apply 1 application topically 2 (two) times daily as needed (eczema).  . diphenhydrAMINE (BENADRYL) 25 MG tablet Take 25 mg by mouth daily as needed for allergies.  Marland Kitchen doxycycline (VIBRA-TABS) 100 MG tablet Take 1 tablet (100 mg total) by mouth 2 (two) times daily.  Marland Kitchen EPINEPHrine (EPIPEN 2-PAK) 0.3 mg/0.3 mL IJ SOAJ injection USE AS DIRECTED FOR SEVERE ALLERGIC REACTION.  . fluticasone (FLONASE) 50 MCG/ACT nasal spray USE 2 SPRAY(S) IN EACH NOSTRIL ONCE DAILY AS NEEDED FOR  ALLERGIES  OR  RHINITIS  . furosemide (LASIX) 20 MG tablet Take 0.5 tablets (10  mg total) by mouth daily. As needed for swelling  . gabapentin (NEURONTIN) 100 MG capsule Take 1 capsule (100 mg total) by mouth 3 (three) times daily. Pt takes 400mg  in the morning, 400mg  at lunchtime and 700mg  in the evening  . glucose blood (BAYER CONTOUR TEST) test strip 1 each 4 (four) times daily.   . Guaifenesin (MUCINEX MAXIMUM STRENGTH) 1200 MG TB12 Take 1,200 mg by mouth 2 (two) times daily.  Marland Kitchen guaiFENesin-dextromethorphan (ROBITUSSIN DM) 100-10 MG/5ML syrup Take 5 mLs by mouth every 4 (four) hours as needed for cough.  . hydroxychloroquine (PLAQUENIL) 200 MG tablet Take 1 tablet (200 mg total) by mouth 2 (two) times daily.  . insulin aspart (NOVOLOG FLEXPEN) 100 UNIT/ML FlexPen Inject 40-100 Units into the skin 3 (three) times daily with meals.  . insulin regular human CONCENTRATED (HUMULIN R) 500 UNIT/ML injection Inject 0.08-0.3 mLs (40-150 Units total) into the skin 3 (three) times daily with meals.  . isosorbide mononitrate (IMDUR) 30 MG 24 hr tablet Take 1 tablet (30 mg total) by mouth daily.  Marland Kitchen levocetirizine (XYZAL) 5 MG tablet TAKE 1 TABLET BY MOUTH ONCE DAILY IN THE EVENING  . levothyroxine (SYNTHROID) 200 MCG tablet Take 1 tablet (200 mcg total) by mouth daily before breakfast.  . magnesium oxide (MAG-OX) 400 MG tablet Take 400-1,600 mg by mouth 2 (two) times daily as needed (cramps).  .  metFORMIN (GLUCOPHAGE) 500 MG tablet Take 1 tablet (500 mg total) by mouth 2 (two) times daily with a meal.  . nitroGLYCERIN (NITROSTAT) 0.4 MG SL tablet DISSOLVE ONE TABLET UNDER THE TONGUE EVERY 5 MINUTES AS NEEDED FOR CHEST PAIN.  DO NOT EXCEED A TOTAL OF 3 DOSES IN 15 MINUTES  . ondansetron (ZOFRAN) 4 MG tablet Take 8 mg by mouth every 6 (six) hours as needed for nausea or vomiting.  . Oxycodone HCl 10 MG TABS Take 10 mg by mouth every 6 (six) hours as needed (pain).   Marland Kitchen oxymetazoline (AFRIN) 0.05 % nasal spray Place 1 spray into both nostrils 2 (two) times daily as needed for congestion.  . pantoprazole (PROTONIX) 40 MG tablet Take 1 tablet (40 mg total) by mouth daily. Must call and make follow up appt for further refills. 1st attempt  . Phenylephrine HCl (NEO-SYNEPHRINE NA) Place 1 spray into both nostrils daily as needed (congestion).  . pilocarpine (SALAGEN) 5 MG tablet Take 1 tablet (5 mg total) by mouth 2 (two) times daily.  Bertram Gala Glycol-Propyl Glycol (SYSTANE ULTRA OP) Place 2 drops into both eyes daily as needed (dry eyes).  . potassium chloride (KLOR-CON) 10 MEQ tablet Take 1 tab for every 20 mg of Lasix taken.  . pramipexole (MIRAPEX) 0.5 MG tablet Take 0.5 mg by mouth See admin instructions. Take 0.5 mg up to 6 times daily as needed for restless legs  . PROAIR HFA 108 (90 Base) MCG/ACT inhaler INHALE 2 PUFFS BY MOUTH EVERY 4 HOURS AS NEEDED FOR WHEEZING AND FOR SHORTNESS OF BREATH  . promethazine (PHENERGAN) 25 MG tablet TAKE 1 TABLET BY MOUTH EVERY 8 HOURS AS NEEDED FOR NAUSEA AND FOR VOMITING  . saccharomyces boulardii (FLORASTOR) 250 MG capsule Take 250 mg by mouth 3 (three) times daily.  . valsartan (DIOVAN) 160 MG tablet Take 0.5 tablets (80 mg total) by mouth daily.  . Vitamin D, Ergocalciferol, (DRISDOL) 1.25 MG (50000 UNIT) CAPS capsule Take 1 capsule (50,000 Units total) by mouth every 7 (seven) days. Take on Friday  . [DISCONTINUED] clopidogrel (PLAVIX) 75  MG tablet  Take 1 tablet (75 mg total) by mouth daily. Please make yearly appt with Dr. Mayford Knife for August before anymore refills. 1st attempt  . [DISCONTINUED] isosorbide mononitrate (IMDUR) 30 MG 24 hr tablet Take 1 tablet by mouth once daily  . [DISCONTINUED] nitroGLYCERIN (NITRODUR - DOSED IN MG/24 HR) 0.2 mg/hr patch Place 1 patch (0.2 mg total) onto the skin daily.  . [DISCONTINUED] valsartan (DIOVAN) 160 MG tablet Take 1 tablet (160 mg total) by mouth daily.     Allergies: Allergies  Allergen Reactions  . Fish Allergy Anaphylaxis    INCLUDES OMEGA 3 OILS  . Fish Oil Anaphylaxis and Swelling    THROAT SWELLS INCLUDES FISH AS A CLASS  . Omega-3 Fatty Acids Swelling    Throat swelling    . Crestor [Rosuvastatin] Other (See Comments)    Extreme joint pain, to this & other statins  . Gabapentin Anxiety    Anxiety on high doses  . Lovastatin Other (See Comments)    EXTREME JOINT PAIN  . Metronidazole Nausea Only and Swelling    Headache and shakes Nausea and vomiting  . Red Yeast Rice [Cholestin] Other (See Comments)    Muscle pain and severe joint pain.   . Rotigotine Swelling    Extreme edema   . Atrovent Hfa [Ipratropium Bromide Hfa] Other (See Comments)    wheezing  . Enalapril     Cough  . Red Yeast Rice Extract     Joint pain  . Victoza [Liraglutide]     Unsure of reaction   . Suprep [Na Sulfate-K Sulfate-Mg Sulf] Nausea And Vomiting  . Tape Other (See Comments)    Electrodes causes skin breakdown    Social History: The patient  reports that she quit smoking about 16 years ago. Her smoking use included cigarettes. She has a 20.00 pack-year smoking history. She has never used smokeless tobacco. She reports current alcohol use. She reports previous drug use. Drug: Marijuana.   Family History: The patient's family history includes Allergic rhinitis in her father; Asthma in her father; Breast cancer in her paternal aunt; CAD in her father and mother; Heart attack in her father  and mother; Hypertension in her brother, brother, and mother; Lung cancer in her paternal aunt; Pancreatic cancer in her paternal aunt.   Review of Systems: Please see the history of present illness.   All other systems are reviewed and negative.   Physical Exam: VS:  BP (!) 126/50   Pulse (!) 110   Ht  (1.575 m)   Wt (!) 327 lb (148.3 kg)   SpO2 96%   BMI 59.81 kg/m  .  BMI Body mass index is 59.81 kg/m.  Wt Readings from Last 3 Encounters:  05/04/20 (!) 327 lb (148.3 kg)  04/30/20 290 lb (131.5 kg)  04/19/20 290 lb (131.5 kg)    General: Alert and in no acute distress. She is morbidly obese. She is in a wheelchair. Looks older than her stated age.  She was 298# when actually seen here in 2019.  Cardiac: Heart tones are very distant. Legs are full. Dressing in place to right foot.  Respiratory:  Decreased breath sounds. GI: Obese.  MS: No deformity or atrophy. Gait not tested.  Skin: Warm and dry. Color is sallow/pale Neuro:  Strength and sensation are intact and no gross focal deficits noted.  Psych: Alert, appropriate and with normal affect.   LABORATORY DATA:  EKG:  EKG is ordered today.  Personally reviewed  by me. This demonstrates   Lab Results  Component Value Date   WBC 9.1 01/31/2020   HGB 10.1 (L) 01/31/2020   HCT 34.6 (L) 01/31/2020   PLT 340 01/31/2020   GLUCOSE 279 (H) 02/26/2020   CHOL 217 (H) 04/08/2018   TRIG 116 04/08/2018   HDL 48 04/08/2018   LDLCALC 146 (H) 04/08/2018   ALT 28 01/21/2020   AST 29 01/21/2020   NA 138 02/26/2020   K 4.0 02/26/2020   CL 102 02/26/2020   CREATININE 0.97 02/26/2020   BUN 10 02/26/2020   CO2 26 02/26/2020   TSH 0.624 10/20/2018   INR 1.07 10/17/2018   HGBA1C 9.0 (H) 01/28/2020   MICROALBUR 9.3 (H) 02/03/2020     BNP (last 3 results) Recent Labs    08/01/19 1616 01/21/20 1704  BNP 25.4 7.8    ProBNP (last 3 results) No results for input(s): PROBNP in the last 8760 hours.   Prior CV studies:     Myoview Study Highlights 08/2019   Nuclear stress EF: 53%. The left ventricular ejection fraction is mildly decreased (45-54%).  There was no ST segment deviation noted during stress.  This is a low risk study. There is no evidence of ischemia or previous infarction.  The study is normal.     30-day monitor 03/25/2019  Normal sinus rhythm and sinus tachycardia. The average heart rate was 93bpm and the heart rate ranged from 73 to 140bpm.  Rare PVC and ventricular couplet  Frequeng PACs and nonsustained atrial tachycardia  Blocked PAC   CORONARY STENT INTERVENTION 04/2018  LEFT HEART CATH AND CORONARY ANGIOGRAPHY  Conclusion    Mid RCA lesion is 10% stenosed.  Dist RCA lesion is 10% stenosed.  Ost RCA lesion is 99% stenosed.  Ost LAD to Prox LAD lesion is 30% stenosed.  A drug-eluting stent was successfully placed using a STENT SIERRA 3.00 X 12 MM.  Post intervention, there is a 0% residual stenosis.  Prox Cx to Mid Cx lesion is 20% stenosed.   1. Severe stenosis ostial RCA. 2. Patent stents mid and distal RCA with minimal restenosis.  3. Mild non-obstructive disease in the LAD and Circumflex 4. Successful PTCA/DES x 1 ostial RCA  Recommendations:  Recommend uninterrupted dual antiplatelet therapy with Aspirin 81mg  daily and Clopidogrel 75mg  daily for a minimum of 6 months (stable ischemic heart disease - Class I recommendation). If she tolerates, DAPT, would continue for one year.     2D Echo 04/09/2018  Study Conclusions  - Procedure narrative: Transthoracic echocardiography. Image quality was suboptimal. The study was technically difficult. Intravenous contrast (Definity) was administered. - Left ventricle: The cavity size was normal. Systolic function was normal. The estimated ejection fraction was in the range of 60% to 65%. Wall motion was normal; there were no regional wall motion abnormalities. Left ventricular diastolic  function parameters were normal. - Aortic valve: Trileaflet; mildly thickened, mildly calcified leaflets. - Mitral valve: Poorly visualized. Calcified annulus. - Left atrium: The atrium was mildly dilated. - Tricuspid valve: Poorly visualized. - Pulmonic valve: Poorly visualized. - Pulmonary arteries: Systolic pressure could not be accurately estimated.   ASSESSMENT & PLAN:    1. PAT - unclear as to why she is no longer on Diltiazem - would favor restarting. Starting Diltiazem 240 mg a day.   2. CAD with prior PCI to the RCA from 2019 - she has completed over a year of DAPT - would favor stopping Plavix today. She has no exertional  symptoms.   3. Morbid obesity - crux of several issues for her - unfortunately, I do not really see this changing.   4. HTN - BP acceptable today and at goal. Sounds like she has had prior ACE cough. Add this to her allergies. I am going to cut the dose of her ARB back today since we are going to add back her CCB.   5. HLD - statin intolerant - to have seen the lipid clinic - does not appear that this has happened.   6. PAF - in the setting of sepsis - has not recurred - was not placed on anticoagulation - looks to have PAT today.   7. Mild anemia - unclear as to etiology - rechecking CBC today.   8. Recent partial foot amputation - seeing Dr. Lajoyce Corners later today for concerns of not healing/infection.   9. COVID 19 - we would favor vaccination from our standpoint.  Current medicines are reviewed with the patient today.  The patient does not have concerns regarding medicines other than what has been noted above.  The following changes have been made:  See above.  Labs/ tests ordered today include:    Orders Placed This Encounter  Procedures  . Basic metabolic panel  . CBC  . Hepatic function panel  . Lipid panel  . TSH  . EKG 12-Lead     Disposition:   FU with [place] in 4 weeks with repeat EKG. Plan of care has been discussed with Dr. Mayford Knife  who is in agreement.    Patient is agreeable to this plan and will call if any problems develop in the interim.   SignedNorma Fredrickson, NP  05/04/2020 9:39 AM  [facility] [address] [place], [place]  [address] Phone: [phone] Fax: [phone]

## 2020-04-26 NOTE — Telephone Encounter (Signed)
Called this am to start PA.//have called the patient already this am to gather information needed from her. Doing a standard request due to time from.  If not completed in the 24 hr time frame is immediately denied. They will fax over paper work that will need PCP documentation//as well as clinical notes to reflect need for this insulin.

## 2020-04-27 MED ORDER — HUMULIN R U-500 (CONCENTRATED) 500 UNIT/ML ~~LOC~~ SOLN: 40.0000 [IU] | Freq: Three times a day (TID) | SUBCUTANEOUS | 4 refills | Status: DC

## 2020-04-27 NOTE — Telephone Encounter (Signed)
PCP completed form/faxed over to [facility] for review.

## 2020-04-27 NOTE — Addendum Note (Signed)
Addended by: Scharlene Gloss B on: 04/27/2020 03:45 PM   Modules accepted: Orders

## 2020-04-27 NOTE — Telephone Encounter (Signed)
Sent in prescription for the patient

## 2020-04-27 NOTE — Telephone Encounter (Signed)
Received approval of insulin from 04/27/2020 through 04/27/2021. Called informed the patient. Will send in refill to pharmacy

## 2020-04-28 ENCOUNTER — Telehealth: Payer: Self-pay | Admitting: Orthopedic Surgery

## 2020-04-28 NOTE — Telephone Encounter (Signed)
[name] from [facility] called.   Requesting verbal orders for home health PT 2wk4 followed by 1wk1 beginning 8/23  Call back: [phone]

## 2020-04-28 NOTE — Telephone Encounter (Signed)
Pt is s/p a 4th and 5th ray amputation called and sw HHPT advised verbal ok for orders as requested below. To call with any other questions.

## 2020-04-30 ENCOUNTER — Encounter: Payer: Self-pay | Admitting: Physician Assistant

## 2020-04-30 ENCOUNTER — Ambulatory Visit (INDEPENDENT_AMBULATORY_CARE_PROVIDER_SITE_OTHER): Payer: 59

## 2020-04-30 ENCOUNTER — Ambulatory Visit (INDEPENDENT_AMBULATORY_CARE_PROVIDER_SITE_OTHER): Payer: 59 | Admitting: Physician Assistant

## 2020-04-30 VITALS — Ht 62.0 in | Wt 290.0 lb

## 2020-04-30 DIAGNOSIS — Z89421 Acquired absence of other right toe(s): Secondary | ICD-10-CM

## 2020-04-30 MED ORDER — DOXYCYCLINE HYCLATE 100 MG PO TABS: 100.0000 mg | ORAL_TABLET | Freq: Two times a day (BID) | ORAL | 0 refills | Status: DC

## 2020-04-30 NOTE — Progress Notes (Signed)
Office Visit Note   Patient: [name]           Date of Birth: 1961/06/17           MRN: [id number] Visit Date: 04/30/2020              Requested by: Sharlene Dory, DO [address] [place],  [place] [address] PCP: Sharlene Dory, DO  Chief Complaint  Patient presents with  . Right Foot - Routine Post Op    01/28/20 right foot 4th and 5th ray amputation       HPI: Patient is 3 months status post right fourth fifth ray amputation.  She has been slow to heal.  She was using a nitroglycerin patch but this caused some significant dermatitis on the dorsum of her foot and have discontinued it.  She notes that over the last few weeks she has had more pain than she had prior to surgery when she really did not have any she denies any fever chills  Assessment & Plan: Visit Diagnoses:  1. 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 Lajoyce Corners.  She was cautioned that she has any increasing drainage redness pain fever chills she is to go to the emergency room or urgent care.  I did discuss with her that I do have some concerns from some inconsistency in the bony x-rays.  I would like for Dr. Lajoyce Corners to review these with her on Monday.  Follow-Up Instructions: No follow-ups on file.   Ortho Exam  Patient is alert, oriented, no adenopathy, well-dressed, normal affect, normal respiratory effort. Patient appears well.  She does have erythema bilaterally dermatitis on her lower legs which is stable per her.  She has resolving dermatitis on the dorsum of her foot.  Incision is approximated but has some erythema no foul odor no drainage no fluctuance but she is tender to palpation no evidence of necrosis.  She also says she has some pain in her ankle no erythema around the ankle she has good ankle range of motion but does have some tenderness no noted  cellulitis  Imaging: No results found. No images are attached to the encounter.  Labs: Lab Results  Component Value Date   HGBA1C 9.0 (H) 01/28/2020   HGBA1C 9.1 (H) 08/15/2019   HGBA1C 8.4 (H) 01/02/2018   ESRSEDRATE 37 (H) 01/02/2018   CRP 9.8 (H) 01/02/2018   REPTSTATUS 02/03/2020 FINAL 01/28/2020   GRAMSTAIN  01/28/2020    ABUNDANT WBC PRESENT,BOTH PMN AND MONONUCLEAR ABUNDANT GRAM POSITIVE COCCI ABUNDANT GRAM NEGATIVE RODS FEW GRAM POSITIVE RODS    CULT  01/28/2020    FEW PROTEUS MIRABILIS FEW STAPHYLOCOCCUS AUREUS FEW ENTEROCOCCUS FAECALIS ABUNDANT BACTEROIDES SPECIES BETA LACTAMASE NEGATIVE Performed at [facility], [address], [place], [place] [address]    LABORGA PROTEUS MIRABILIS 01/28/2020   LABORGA STAPHYLOCOCCUS AUREUS 01/28/2020   LABORGA ENTEROCOCCUS FAECALIS 01/28/2020     Lab Results  Component Value Date   ALBUMIN 3.2 (L) 01/21/2020   ALBUMIN 3.8 08/01/2019   ALBUMIN 3.3 (L) 10/19/2018    Lab Results  Component Value Date   MG 2.2 10/20/2018   MG 2.2 10/19/2018   MG 1.2 (L) 10/17/2018   Lab Results  Component Value Date   VD25OH 69.04 07/25/2017    No results found for: PREALBUMIN CBC EXTENDED  Latest Ref Rng & Units 01/31/2020 01/21/2020 01/21/2020  WBC 4.0 - 10.5 K/uL 9.1 - 9.2  RBC 3.87 - 5.11 MIL/uL 4.11 - 4.57  HGB 12.0 - 15.0 g/dL 10.1(L) 12.6 11.4(L)  HCT 36 - 46 % 34.6(L) 37.0 37.7  PLT 150 - 400 K/uL 340 - 369  NEUTROABS 1.7 - 7.7 K/uL - - 5.9  LYMPHSABS 0.7 - 4.0 K/uL - - 2.0     Body mass index is 53.04 kg/m.  Orders:  Orders Placed This Encounter  Procedures  . XR Foot 2 Views Right   Meds ordered this encounter  Medications  . doxycycline (VIBRA-TABS) 100 MG tablet    Sig: Take 1 tablet (100 mg total) by mouth 2 (two) times daily.    Dispense:  30 tablet    Refill:  0     Procedures: No procedures performed  Clinical Data: No additional findings.  ROS:  All other systems negative, except as  noted in the HPI. Review of Systems  Objective: Vital Signs: Ht  (1.575 m)   Wt 290 lb (131.5 kg)   BMI 53.04 kg/m   Specialty Comments:  No specialty comments available.  PMFS History: Patient Active Problem List   Diagnosis Date Noted  . Sjogren's syndrome (HCC) 02/11/2020  . Acute hematogenous osteomyelitis of right foot (HCC) 01/28/2020  . Cutaneous abscess of right foot   . Subacute osteomyelitis, right ankle and foot (HCC)   . Statin intolerance 08/16/2019  . PAF (paroxysmal atrial fibrillation) (HCC) 01/06/2019  . Gram-negative bacteremia 10/18/2018  . Streptococcal bacteremia 10/18/2018  . Ulcer of lower extremity, limited to breakdown of skin (HCC) 10/03/2018  . CAD S/P percutaneous coronary angioplasty 04/19/2018  . Essential hypertension   . Moderate persistent asthma 03/21/2018  . NAFLD (nonalcoholic fatty liver disease)   . Recurrent cellulitis of lower extremity 01/02/2018  . Cellulitis of lower leg 01/02/2018  . Chronic diarrhea 05/08/2017  . H/O Clostridium difficile infection 05/08/2017  . Rectal bleeding 05/08/2017  . Hyperbilirubinemia 04/29/2016  . Hyponatremia 04/29/2016  . Cellulitis of leg, right 03/09/2016  . Mild persistent asthma 02/15/2016  . Allergic rhinitis due to pollen 02/15/2016  . Anaphylactic reaction due to food 02/10/2016  . Diabetes mellitus type 2 in obese (HCC) 02/10/2016  . Coronary artery disease involving native coronary artery of native heart without angina pectoris 02/10/2016  . Gastroesophageal reflux disease without esophagitis 02/10/2016  . Atopic eczema 02/10/2016  . Cervical nerve root disorder 12/15/2015  . Lumbar radiculopathy 12/15/2015  . Daytime somnolence 11/26/2015  . Venous stasis dermatitis of both lower extremities 02/11/2015  . Morbid obesity (HCC) 06/19/2014  . B-complex deficiency 03/26/2014  . Benign essential HTN 03/26/2014  . Chronic pain associated with significant psychosocial dysfunction  03/26/2014  . Diaphragmatic hernia 03/26/2014  . Gastroesophageal reflux disease 03/26/2014  . Hammer toe 03/26/2014  . H/O neoplasm 03/26/2014  . 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 Diabetic polyneuropathy (HCC) 03/26/2014  . Deafness, sensorineural 03/26/2014  . Fibromyalgia 01/20/2014  . 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 Insulin dependent diabetes mellitus (HCC) 11/11/2012  . Hyperlipidemia LDL goal <70 11/11/2012  . Mixed connective tissue disease (HCC) 11/11/2012  . Hypothyroidism 11/11/2012  . Uncomplicated asthma 11/11/2012  . 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HCC) 02/20/2012  . 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 ANA positive 07/17/2011   Past Medical History:  Diagnosis Date  . Asthma    "on daily RX  and rescue inhaler" (04/18/2018)  . Chronic diastolic CHF (congestive heart failure) (HCC) 09/2015  . Chronic lower back pain   . Chronic neck pain   . Chronic pain syndrome    Fentanyl Patch  . Colon polyps   . Coronary artery disease    cath with normal LM, 30% LAD, 85% mid RCA and 95% distal RCA s/p PCI of the mid to distal RCA and now on DAPT with ASA and Ticagrelor.    . Eczema   . Excessive daytime sleepiness 11/26/2015  . Fibromyalgia   . Gallstones   . GERD (gastroesophageal reflux disease)   . Heart murmur    "noted for the 1st time on 04/18/2018"  . History of blood transfusion 07/2010   "S/P oophorectomy"  . History of gout   . History of hiatal hernia 1980s   "gone now" (04/18/2018)  . Hyperlipidemia   . Hypertension    takes Metoprolol and Enalapril daily  . Hypothyroidism    takes Synthroid daily  . IBS (irritable bowel syndrome)   . Migraine    "nothing in the 2000s" (04/18/2018)  . Mixed connective tissue disease (HCC)   . NAFLD (nonalcoholic fatty liver disease)   . Pneumonia    "several times" (04/18/2018)  . Rheumatoid arthritis (HCC)    "hands, elbows, shoulders,  probably knees" (04/18/2018)  . Scoliosis   . Spondylosis   . Type II diabetes mellitus (HCC)    takes Metformin and Hum R daily (04/18/2018)    Family History  Problem Relation Age of Onset  . CAD Mother   . Hypertension Mother   . Heart attack Mother   . CAD Father   . Heart attack Father   . Allergic rhinitis Father   . Asthma Father   . Hypertension Brother   . Hypertension Brother   . Pancreatic cancer Paternal Aunt   . Breast cancer Paternal Aunt   . Lung cancer Paternal Aunt     Past Surgical History:  Procedure Laterality Date  . ABDOMINAL HYSTERECTOMY  06/2005   "w/right ovariy"  . AMPUTATION Right 01/28/2020    RIGHT FOURTH AND FIFTH RAY AMPUTATION  . AMPUTATION Right 01/28/2020   Procedure: RIGHT FOURTH AND FIFTH RAY AMPUTATION,;  Surgeon: [name], MD;  Location: [facility] OR;  Service: Orthopedics;  Laterality: Right;  . APPENDECTOMY    . BREAST BIOPSY Bilateral    9 total (04/18/2018)  . CORONARY ANGIOPLASTY WITH STENT PLACEMENT  10/01/2015   normal LM, 30% LAD, 85% mid RCA and 95% distal RCA s/p PCI of the mid to distal RCA and now on DAPT with ASA and Ticagrelor.    . CORONARY STENT INTERVENTION Right 04/18/2018   Procedure: CORONARY STENT INTERVENTION;  Surgeon: [name], MD;  Location: [facility];  Service: Cardiovascular;  Laterality: Right;  . DILATION AND CURETTAGE OF UTERUS    . FRACTURE SURGERY    . LAPAROSCOPIC CHOLECYSTECTOMY    . LEFT HEART CATH AND CORONARY ANGIOGRAPHY N/A 04/18/2018   Procedure: LEFT HEART CATH AND CORONARY ANGIOGRAPHY;  Surgeon: [name], MD;  Location: [facility];  Service: Cardiovascular;  Laterality: N/A;  . MUSCLE BIOPSY Left    "leg"  . OOPHORECTOMY  07/2010  . RADIOLOGY WITH ANESTHESIA N/A 05/25/2016   Procedure: RADIOLOGY WITH ANESTHESIA;  Surgeon: Medication Radiologist, MD;  Location: [facility] OR;  Service: Radiology;  Laterality: N/A;  . TONSILLECTOMY AND ADENOIDECTOMY    . WRIST FRACTURE  SURGERY  Left    "crushed it"   Social History   Occupational History  . Not on file  Tobacco Use  . Smoking status: Former Smoker    Packs/day: 1.00    Years: 20.00    Pack years: 20.00    Types: Cigarettes    Quit date: 02/11/2004    Years since quitting: 16.2  . Smokeless tobacco: Never Used  Vaping Use  . Vaping Use: Never used  Substance and Sexual Activity  . Alcohol use: Yes    Comment: RARE  . Drug use: Not Currently    Types: Marijuana    Comment: "only in my teens"  . Sexual activity: Yes    Birth control/protection: Surgical

## 2020-05-04 ENCOUNTER — Encounter: Payer: Self-pay | Admitting: Orthopedic Surgery

## 2020-05-04 ENCOUNTER — Other Ambulatory Visit: Payer: Self-pay | Admitting: *Deleted

## 2020-05-04 ENCOUNTER — Ambulatory Visit (INDEPENDENT_AMBULATORY_CARE_PROVIDER_SITE_OTHER): Payer: 59 | Admitting: Nurse Practitioner

## 2020-05-04 ENCOUNTER — Encounter: Payer: Self-pay | Admitting: Nurse Practitioner

## 2020-05-04 ENCOUNTER — Other Ambulatory Visit: Payer: Self-pay

## 2020-05-04 ENCOUNTER — Ambulatory Visit (INDEPENDENT_AMBULATORY_CARE_PROVIDER_SITE_OTHER): Payer: 59 | Admitting: Orthopedic Surgery

## 2020-05-04 VITALS — BP 126/50 | HR 110 | Ht 62.0 in | Wt 327.0 lb

## 2020-05-04 VITALS — Ht 62.0 in | Wt 327.0 lb

## 2020-05-04 DIAGNOSIS — R05 Cough: Secondary | ICD-10-CM

## 2020-05-04 DIAGNOSIS — Z6841 Body Mass Index (BMI) 40.0 and over, adult: Secondary | ICD-10-CM

## 2020-05-04 DIAGNOSIS — I10 Essential (primary) hypertension: Secondary | ICD-10-CM | POA: Diagnosis not present

## 2020-05-04 DIAGNOSIS — R0789 Other chest pain: Secondary | ICD-10-CM | POA: Diagnosis not present

## 2020-05-04 DIAGNOSIS — I480 Paroxysmal atrial fibrillation: Secondary | ICD-10-CM

## 2020-05-04 DIAGNOSIS — E785 Hyperlipidemia, unspecified: Secondary | ICD-10-CM

## 2020-05-04 DIAGNOSIS — T8130XA Disruption of wound, unspecified, initial encounter: Secondary | ICD-10-CM

## 2020-05-04 DIAGNOSIS — Z89421 Acquired absence of other right toe(s): Secondary | ICD-10-CM | POA: Diagnosis not present

## 2020-05-04 DIAGNOSIS — I2510 Atherosclerotic heart disease of native coronary artery without angina pectoris: Secondary | ICD-10-CM

## 2020-05-04 DIAGNOSIS — I471 Supraventricular tachycardia: Secondary | ICD-10-CM

## 2020-05-04 DIAGNOSIS — I96 Gangrene, not elsewhere classified: Secondary | ICD-10-CM

## 2020-05-04 DIAGNOSIS — T464X5A Adverse effect of angiotensin-converting-enzyme inhibitors, initial encounter: Secondary | ICD-10-CM

## 2020-05-04 DIAGNOSIS — M351 Other overlap syndromes: Secondary | ICD-10-CM

## 2020-05-04 DIAGNOSIS — R058 Other specified cough: Secondary | ICD-10-CM

## 2020-05-04 LAB — LIPID PANEL
Chol/HDL Ratio: 4.3 ratio (ref 0.0–4.4)
Cholesterol, Total: 176 mg/dL (ref 100–199)
HDL: 41 mg/dL (ref >39)
LDL Chol Calc (NIH): 100 mg/dL — ABNORMAL HIGH (ref 0–99)
Triglycerides: 204 mg/dL — ABNORMAL HIGH (ref 0–149)
VLDL Cholesterol Cal: 35 mg/dL (ref 5–40)

## 2020-05-04 LAB — HEPATIC FUNCTION PANEL
ALT: 23 IU/L (ref 0–32)
AST: 25 IU/L (ref 0–40)
Albumin: 4 g/dL (ref 3.8–4.9)
Alkaline Phosphatase: 84 IU/L (ref 48–121)
Bilirubin Total: <0.2 mg/dL (ref 0.0–1.2)
Bilirubin, Direct: 0.1 mg/dL (ref 0.00–0.40)
Total Protein: 6.5 g/dL (ref 6.0–8.5)

## 2020-05-04 LAB — CBC
Hematocrit: 35.4 % (ref 34.0–46.6)
Hemoglobin: 10.5 g/dL — ABNORMAL LOW (ref 11.1–15.9)
MCH: 23.3 pg — ABNORMAL LOW (ref 26.6–33.0)
MCHC: 29.7 g/dL — ABNORMAL LOW (ref 31.5–35.7)
MCV: 79 fL (ref 79–97)
Platelets: 292 10*3/uL (ref 150–450)
RBC: 4.5 x10E6/uL (ref 3.77–5.28)
RDW: 14.6 % (ref 11.7–15.4)
WBC: 7.4 10*3/uL (ref 3.4–10.8)

## 2020-05-04 LAB — BASIC METABOLIC PANEL
BUN/Creatinine Ratio: 16 (ref 9–23)
BUN: 14 mg/dL (ref 6–24)
CO2: 21 mmol/L (ref 20–29)
Calcium: 9.7 mg/dL (ref 8.7–10.2)
Chloride: 98 mmol/L (ref 96–106)
Creatinine, Ser: 0.85 mg/dL (ref 0.57–1.00)
GFR calc Af Amer: 87 mL/min/{1.73_m2} (ref >59)
GFR calc non Af Amer: 76 mL/min/{1.73_m2} (ref >59)
Glucose: 222 mg/dL — ABNORMAL HIGH (ref 65–99)
Potassium: 4.5 mmol/L (ref 3.5–5.2)
Sodium: 138 mmol/L (ref 134–144)

## 2020-05-04 LAB — TSH: TSH: 2.17 u[IU]/mL (ref 0.450–4.500)

## 2020-05-04 MED ORDER — MUPIROCIN 2 % EX OINT: 1.0000 "application " | TOPICAL_OINTMENT | Freq: Two times a day (BID) | CUTANEOUS | 3 refills | Status: DC

## 2020-05-04 MED ORDER — PENTOXIFYLLINE ER 400 MG PO TBCR: 400.0000 mg | EXTENDED_RELEASE_TABLET | Freq: Three times a day (TID) | ORAL | 3 refills | Status: DC

## 2020-05-04 MED ORDER — DILTIAZEM HCL ER 240 MG PO CP24: 240.0000 mg | ORAL_CAPSULE | Freq: Every day | ORAL | 3 refills | Status: DC

## 2020-05-04 MED ORDER — VALSARTAN 160 MG PO TABS: 80.0000 mg | ORAL_TABLET | Freq: Every day | ORAL | 2 refills | Status: DC

## 2020-05-04 MED ORDER — ISOSORBIDE MONONITRATE ER 30 MG PO TB24: 30.0000 mg | ORAL_TABLET | Freq: Every day | ORAL | 3 refills | Status: DC

## 2020-05-04 NOTE — Addendum Note (Signed)
Addended by: Aldean Baker on: 05/04/2020 01:43 PM   Modules accepted: Orders

## 2020-05-04 NOTE — Progress Notes (Signed)
Office Visit Note   Patient: [name]           Date of Birth: 1961-08-12           MRN: [id number] Visit Date: 05/04/2020              Requested by: Sharlene Dory, DO [address] [place],  [place] [address] PCP: Sharlene Dory, DO  Chief Complaint  Patient presents with  . Right Foot - Routine Post Op    01/28/20 right foot 4th and 5th ray amputation       HPI: Patient is a 59 year old woman who presents 3 months status post right foot fourth and fifth ray amputation she states she has been having increasing swelling wound breakdown and increased pain.  Assessment & Plan: Visit Diagnoses:  1. 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 Body mass index 60.0-69.9, adult (HCC)   3. Morbid obesity (HCC)   4. Wound dehiscence   5. Gangrene of right foot (HCC)     Plan: Discussed the importance of decreasing swelling to improve the microscopic circulation.  Recommended that she resume using the nitroglycerin patch washed with soap and water and move the patch daily in line with the second toe.  A prescription was added for Trental she has just completed her course of Plavix for cardiac stent.  Recommended elevation range of motion exercises and discussed the importance of resuming her compression stockings.  We will follow-up closely next week.  Follow-Up Instructions: Return in about 1 week (around 05/11/2020).   Ortho Exam  Patient is alert, oriented, no adenopathy, well-dressed, normal affect, normal respiratory effort. Examination patient has a strong dorsalis pedis pulse she has massive venous and lymphatic swelling in the right lower extremity with pitting edema that is tender her calf measures 46 cm in circumference.  She has a area of ischemic eschar over the wound which is 3 x 2 cm 50% has healthy granulation tissue 50% with black eschar that appears partial-thickness there is no exposed bone or  tendon.  Imaging: No results found. No images are attached to the encounter.  Labs: Lab Results  Component Value Date   HGBA1C 9.0 (H) 01/28/2020   HGBA1C 9.1 (H) 08/15/2019   HGBA1C 8.4 (H) 01/02/2018   ESRSEDRATE 37 (H) 01/02/2018   CRP 9.8 (H) 01/02/2018   REPTSTATUS 02/03/2020 FINAL 01/28/2020   GRAMSTAIN  01/28/2020    ABUNDANT WBC PRESENT,BOTH PMN AND MONONUCLEAR ABUNDANT GRAM POSITIVE COCCI ABUNDANT GRAM NEGATIVE RODS FEW GRAM POSITIVE RODS    CULT  01/28/2020    FEW PROTEUS MIRABILIS FEW STAPHYLOCOCCUS AUREUS FEW ENTEROCOCCUS FAECALIS ABUNDANT BACTEROIDES SPECIES BETA LACTAMASE NEGATIVE Performed at [facility], [address], [place], [place] [address]    LABORGA PROTEUS MIRABILIS 01/28/2020   LABORGA STAPHYLOCOCCUS AUREUS 01/28/2020   LABORGA ENTEROCOCCUS FAECALIS 01/28/2020     Lab Results  Component Value Date   ALBUMIN 3.2 (L) 01/21/2020   ALBUMIN 3.8 08/01/2019   ALBUMIN 3.3 (L) 10/19/2018    Lab Results  Component Value Date   MG 2.2 10/20/2018   MG 2.2 10/19/2018   MG 1.2 (L) 10/17/2018   Lab Results  Component Value Date   VD25OH 69.04 07/25/2017    No results found for: PREALBUMIN CBC EXTENDED Latest Ref Rng & Units 01/31/2020 01/21/2020 01/21/2020  WBC 4.0 - 10.5 K/uL 9.1 - 9.2  RBC 3.87 - 5.11  MIL/uL 4.11 - 4.57  HGB 12.0 - 15.0 g/dL 10.1(L) 12.6 11.4(L)  HCT 36 - 46 % 34.6(L) 37.0 37.7  PLT 150 - 400 K/uL 340 - 369  NEUTROABS 1.7 - 7.7 K/uL - - 5.9  LYMPHSABS 0.7 - 4.0 K/uL - - 2.0     Body mass index is 59.81 kg/m.  Orders:  No orders of the defined types were placed in this encounter.  No orders of the defined types were placed in this encounter.    Procedures: No procedures performed  Clinical Data: No additional findings.  ROS:  All other systems negative, except as noted in the HPI. Review of Systems  Objective: Vital Signs: Ht  (1.575 m)   Wt (!) 327 lb (148.3 kg)   BMI 59.81 kg/m    Specialty Comments:  No specialty comments available.  PMFS History: Patient Active Problem List   Diagnosis Date Noted  . Sjogren's syndrome (HCC) 02/11/2020  . Acute hematogenous osteomyelitis of right foot (HCC) 01/28/2020  . Cutaneous abscess of right foot   . Subacute osteomyelitis, right ankle and foot (HCC)   . Statin intolerance 08/16/2019  . PAF (paroxysmal atrial fibrillation) (HCC) 01/06/2019  . Gram-negative bacteremia 10/18/2018  . Streptococcal bacteremia 10/18/2018  . Ulcer of lower extremity, limited to breakdown of skin (HCC) 10/03/2018  . CAD S/P percutaneous coronary angioplasty 04/19/2018  . Essential hypertension   . Moderate persistent asthma 03/21/2018  . NAFLD (nonalcoholic fatty liver disease)   . Recurrent cellulitis of lower extremity 01/02/2018  . Cellulitis of lower leg 01/02/2018  . Chronic diarrhea 05/08/2017  . H/O Clostridium difficile infection 05/08/2017  . Rectal bleeding 05/08/2017  . Hyperbilirubinemia 04/29/2016  . Hyponatremia 04/29/2016  . Cellulitis of leg, right 03/09/2016  . Mild persistent asthma 02/15/2016  . Allergic rhinitis due to pollen 02/15/2016  . Anaphylactic reaction due to food 02/10/2016  . Diabetes mellitus type 2 in obese (HCC) 02/10/2016  . Coronary artery disease involving native coronary artery of native heart without angina pectoris 02/10/2016  . Gastroesophageal reflux disease without esophagitis 02/10/2016  . Atopic eczema 02/10/2016  . Cervical nerve root disorder 12/15/2015  . Lumbar radiculopathy 12/15/2015  . Daytime somnolence 11/26/2015  . Venous stasis dermatitis of both lower extremities 02/11/2015  . Morbid obesity (HCC) 06/19/2014  . B-complex deficiency 03/26/2014  . Benign essential HTN 03/26/2014  . Chronic pain associated with significant psychosocial dysfunction 03/26/2014  . Diaphragmatic hernia 03/26/2014  . Gastroesophageal reflux disease 03/26/2014  . Hammer toe 03/26/2014  . H/O  neoplasm 03/26/2014  . 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 Diabetic polyneuropathy (HCC) 03/26/2014  . Deafness, sensorineural 03/26/2014  . Fibromyalgia 01/20/2014  . 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 Insulin dependent diabetes mellitus (HCC) 11/11/2012  . Hyperlipidemia LDL goal <70 11/11/2012  . Mixed connective tissue disease (HCC) 11/11/2012  . Hypothyroidism 11/11/2012  . Uncomplicated asthma 11/11/2012  . 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HCC) 02/20/2012  . 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 ANA positive 07/17/2011   Past Medical History:  Diagnosis Date  . Asthma    "on daily RX and rescue inhaler" (04/18/2018)  . Chronic diastolic CHF (congestive heart failure) (HCC) 09/2015  . Chronic lower back pain   . Chronic neck pain   . Chronic pain syndrome    Fentanyl Patch  . Colon polyps   . Coronary artery disease    cath with normal LM, 30% LAD,  85% mid RCA and 95% distal RCA s/p PCI of the mid to distal RCA and now on DAPT with ASA and Ticagrelor.    . Eczema   . Excessive daytime sleepiness 11/26/2015  . Fibromyalgia   . Gallstones   . GERD (gastroesophageal reflux disease)   . Heart murmur    "noted for the 1st time on 04/18/2018"  . History of blood transfusion 07/2010   "S/P oophorectomy"  . History of gout   . History of hiatal hernia 1980s   "gone now" (04/18/2018)  . Hyperlipidemia   . Hypertension    takes Metoprolol and Enalapril daily  . Hypothyroidism    takes Synthroid daily  . IBS (irritable bowel syndrome)   . Migraine    "nothing in the 2000s" (04/18/2018)  . Mixed connective tissue disease (HCC)   . NAFLD (nonalcoholic fatty liver disease)   . Pneumonia    "several times" (04/18/2018)  . Rheumatoid arthritis (HCC)    "hands, elbows, shoulders, probably knees" (04/18/2018)  . Scoliosis   . Spondylosis   . Type II diabetes mellitus (HCC)    takes Metformin and Hum R daily  (04/18/2018)    Family History  Problem Relation Age of Onset  . CAD Mother   . Hypertension Mother   . Heart attack Mother   . CAD Father   . Heart attack Father   . Allergic rhinitis Father   . Asthma Father   . Hypertension Brother   . Hypertension Brother   . Pancreatic cancer Paternal Aunt   . Breast cancer Paternal Aunt   . Lung cancer Paternal Aunt     Past Surgical History:  Procedure Laterality Date  . ABDOMINAL HYSTERECTOMY  06/2005   "w/right ovariy"  . AMPUTATION Right 01/28/2020    RIGHT FOURTH AND FIFTH RAY AMPUTATION  . AMPUTATION Right 01/28/2020   Procedure: RIGHT FOURTH AND FIFTH RAY AMPUTATION,;  Surgeon: [name], MD;  Location: [facility] OR;  Service: Orthopedics;  Laterality: Right;  . APPENDECTOMY    . BREAST BIOPSY Bilateral    9 total (04/18/2018)  . CORONARY ANGIOPLASTY WITH STENT PLACEMENT  10/01/2015   normal LM, 30% LAD, 85% mid RCA and 95% distal RCA s/p PCI of the mid to distal RCA and now on DAPT with ASA and Ticagrelor.    . CORONARY STENT INTERVENTION Right 04/18/2018   Procedure: CORONARY STENT INTERVENTION;  Surgeon: [name], MD;  Location: [facility];  Service: Cardiovascular;  Laterality: Right;  . DILATION AND CURETTAGE OF UTERUS    . FRACTURE SURGERY    . LAPAROSCOPIC CHOLECYSTECTOMY    . LEFT HEART CATH AND CORONARY ANGIOGRAPHY N/A 04/18/2018   Procedure: LEFT HEART CATH AND CORONARY ANGIOGRAPHY;  Surgeon: [name], MD;  Location: [facility];  Service: Cardiovascular;  Laterality: N/A;  . MUSCLE BIOPSY Left    "leg"  . OOPHORECTOMY  07/2010  . RADIOLOGY WITH ANESTHESIA N/A 05/25/2016   Procedure: RADIOLOGY WITH ANESTHESIA;  Surgeon: Medication Radiologist, MD;  Location: [facility] OR;  Service: Radiology;  Laterality: N/A;  . TONSILLECTOMY AND ADENOIDECTOMY    . WRIST FRACTURE SURGERY Left    "crushed it"   Social History   Occupational History  . Not on file  Tobacco Use  . Smoking status: Former  Smoker    Packs/day: 1.00    Years: 20.00    Pack years: 20.00    Types: Cigarettes    Quit date: 02/11/2004  Years since quitting: 16.2  . Smokeless tobacco: Never Used  Vaping Use  . Vaping Use: Never used  Substance and Sexual Activity  . Alcohol use: Yes    Comment: RARE  . Drug use: Not Currently    Types: Marijuana    Comment: "only in my teens"  . Sexual activity: Yes    Birth control/protection: Surgical

## 2020-05-04 NOTE — Patient Instructions (Addendum)
After Visit Summary:  We will be checking the following labs today - BMET, CBC, HPF, TSH and Lipids  Medication Instructions:    Continue with your current medicines. BUT  I have refilled the Isosorbide  I am cutting the Valsartan back to just 80 mg - this will be 80 mg a day  I am adding back Diltiazem 240 mg a day - this is to help slow your heart rate  I am stopping the Plavix  Stay on baby aspirin   If you need a refill on your cardiac medications before your next appointment, please call your pharmacy.     Testing/Procedures To Be Arranged:  N/A  Follow-Up:   See lipid clinic here for discussion of PCSK9 therapy  See Dr. Mayford Knife in 1 months with EKG.     At [facility], you and your health needs are our priority.  As part of our continuing mission to provide you with exceptional heart care, we have created designated Provider Care Teams.  These Care Teams include your primary Cardiologist (physician) and Advanced Practice Providers (APPs -  Physician Assistants and Nurse Practitioners) who all work together to provide you with the care you need, when you need it.  Special Instructions:  . Stay safe, wash your hands for at least 20 seconds and wear a mask when needed.  . It was good to talk with you today.    Call the [facility] office at [phone] if you have any questions, problems or concerns.

## 2020-05-05 ENCOUNTER — Other Ambulatory Visit: Payer: Self-pay | Admitting: Family Medicine

## 2020-05-05 DIAGNOSIS — B9689 Other specified bacterial agents as the cause of diseases classified elsewhere: Secondary | ICD-10-CM

## 2020-05-07 ENCOUNTER — Telehealth: Payer: Self-pay | Admitting: Orthopedic Surgery

## 2020-05-07 NOTE — Telephone Encounter (Signed)
Pt is s/p a right 4th and 5th ray amputation 02/23/20 home health physical therapy calling to ask wtb status for pt please advise.

## 2020-05-07 NOTE — Telephone Encounter (Signed)
Angela called. She is the PT working with patient in home. She would like Dr. Lajoyce Corners to know that patient has canceled PT 2x this week. She also would like to know the WB status for her. Angela's call back number is [phone]

## 2020-05-10 NOTE — Telephone Encounter (Signed)
TDWB right with PO shoe

## 2020-05-10 NOTE — Telephone Encounter (Signed)
Called and sw Angie and advised of message below. Will call with other questions.

## 2020-05-13 ENCOUNTER — Ambulatory Visit (INDEPENDENT_AMBULATORY_CARE_PROVIDER_SITE_OTHER): Payer: 59 | Admitting: Physician Assistant

## 2020-05-13 ENCOUNTER — Encounter: Payer: Self-pay | Admitting: Orthopedic Surgery

## 2020-05-13 VITALS — Ht 62.0 in | Wt 327.0 lb

## 2020-05-13 DIAGNOSIS — M86271 Subacute osteomyelitis, right ankle and foot: Secondary | ICD-10-CM

## 2020-05-13 NOTE — Progress Notes (Signed)
Office Visit Note   Patient: [name]           Date of Birth: 11/01/1960           MRN: [id number] Visit Date: 05/13/2020              Requested by: Sharlene Dory, DO [address] [place],  [place] [address] PCP: Sharlene Dory, DO  Chief Complaint  Patient presents with  . 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 She has had difficulty healing this and at last visit was given nitroglycerin patches and started on Trental. She feels it is improving.  Assessment & Plan: Visit Diagnoses: No diagnosis found.  Plan: Continue with dressing changes with mupirocin and Trental and nitroglycerin patches. We will follow up in 2 weeks.  Follow-Up Instructions: No follow-ups on file.   Ortho Exam  Patient is alert, oriented, no adenopathy, well-dressed, normal affect, normal respiratory effort. Chronic venous stasis dermatitis on the right lower extremity has not changed. Focused examination of the amputation stump demonstrates some improved healing with less pain and drainage. There is no foul odor there is some healthy vascular fibrous tissue. No surrounding cellulitis foul odor or fluctuance  Imaging: No results found. No images are attached to the encounter.  Labs: Lab Results  Component Value Date   HGBA1C 9.0 (H) 01/28/2020   HGBA1C 9.1 (H) 08/15/2019   HGBA1C 8.4 (H) 01/02/2018   ESRSEDRATE 37 (H) 01/02/2018   CRP 9.8 (H) 01/02/2018   REPTSTATUS 02/03/2020 FINAL 01/28/2020   GRAMSTAIN  01/28/2020    ABUNDANT WBC PRESENT,BOTH PMN AND MONONUCLEAR ABUNDANT GRAM POSITIVE COCCI ABUNDANT GRAM NEGATIVE RODS FEW GRAM POSITIVE RODS    CULT  01/28/2020    FEW PROTEUS MIRABILIS FEW STAPHYLOCOCCUS AUREUS FEW ENTEROCOCCUS FAECALIS ABUNDANT BACTEROIDES SPECIES BETA LACTAMASE NEGATIVE Performed  at [facility], [address], [place], [place] [address]    LABORGA PROTEUS MIRABILIS 01/28/2020   LABORGA STAPHYLOCOCCUS AUREUS 01/28/2020   LABORGA ENTEROCOCCUS FAECALIS 01/28/2020     Lab Results  Component Value Date   ALBUMIN 4.0 05/04/2020   ALBUMIN 3.2 (L) 01/21/2020   ALBUMIN 3.8 08/01/2019    Lab Results  Component Value Date   MG 2.2 10/20/2018   MG 2.2 10/19/2018   MG 1.2 (L) 10/17/2018   Lab Results  Component Value Date   VD25OH 69.04 07/25/2017    No results found for: PREALBUMIN CBC EXTENDED Latest Ref Rng & Units 05/04/2020 01/31/2020 01/21/2020  WBC 3.4 - 10.8 x10E3/uL 7.4 9.1 -  RBC 3.77 - 5.28 x10E6/uL 4.50 4.11 -  HGB 11.1 - 15.9 g/dL 10.5(L) 10.1(L) 12.6  HCT 34.0 - 46.6 % 35.4 34.6(L) 37.0  PLT 150 - 450 x10E3/uL 292 340 -  NEUTROABS 1.7 - 7.7 K/uL - - -  LYMPHSABS 0.7 - 4.0 K/uL - - -     Body mass index is 59.81 kg/m.  Orders:  No orders of the defined types were placed in this encounter.  No orders of the defined types were placed in this encounter.    Procedures: No procedures performed  Clinical Data: No additional findings.  ROS:  All other systems negative, except as noted in the HPI. Review of Systems  Objective: Vital Signs: Ht  5\' 2"  (1.575 m)   Wt (!) 327 lb (148.3 kg)   BMI 59.81 kg/m   Specialty Comments:  No specialty comments available.  PMFS History: Patient Active Problem List   Diagnosis Date Noted  . Sjogren's syndrome (HCC) 02/11/2020  . Acute hematogenous osteomyelitis of right foot (HCC) 01/28/2020  . Cutaneous abscess of right foot   . Subacute osteomyelitis, right ankle and foot (HCC)   . Statin intolerance 08/16/2019  . PAF (paroxysmal atrial fibrillation) (HCC) 01/06/2019  . Gram-negative bacteremia 10/18/2018  . Streptococcal bacteremia 10/18/2018  . Ulcer of lower extremity, limited to breakdown of skin (HCC) 10/03/2018  . CAD S/P percutaneous coronary angioplasty 04/19/2018  .  Essential hypertension   . Moderate persistent asthma 03/21/2018  . NAFLD (nonalcoholic fatty liver disease)   . Recurrent cellulitis of lower extremity 01/02/2018  . Cellulitis of lower leg 01/02/2018  . Chronic diarrhea 05/08/2017  . H/O Clostridium difficile infection 05/08/2017  . Rectal bleeding 05/08/2017  . Hyperbilirubinemia 04/29/2016  . Hyponatremia 04/29/2016  . Cellulitis of leg, right 03/09/2016  . Mild persistent asthma 02/15/2016  . Allergic rhinitis due to pollen 02/15/2016  . Anaphylactic reaction due to food 02/10/2016  . Diabetes mellitus type 2 in obese (HCC) 02/10/2016  . Coronary artery disease involving native coronary artery of native heart without angina pectoris 02/10/2016  . Gastroesophageal reflux disease without esophagitis 02/10/2016  . Atopic eczema 02/10/2016  . Cervical nerve root disorder 12/15/2015  . Lumbar radiculopathy 12/15/2015  . Daytime somnolence 11/26/2015  . Venous stasis dermatitis of both lower extremities 02/11/2015  . Morbid obesity (HCC) 06/19/2014  . B-complex deficiency 03/26/2014  . Benign essential HTN 03/26/2014  . Chronic pain associated with significant psychosocial dysfunction 03/26/2014  . Diaphragmatic hernia 03/26/2014  . Gastroesophageal reflux disease 03/26/2014  . Hammer toe 03/26/2014  . H/O neoplasm 03/26/2014  . 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 Diabetic polyneuropathy (HCC) 03/26/2014  . Deafness, sensorineural 03/26/2014  . Fibromyalgia 01/20/2014  . 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 Insulin dependent diabetes mellitus (HCC) 11/11/2012  . Hyperlipidemia LDL goal <70 11/11/2012  . Mixed connective tissue disease (HCC) 11/11/2012  . Hypothyroidism 11/11/2012  . Uncomplicated asthma 11/11/2012  . 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HCC) 02/20/2012  . 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 ANA positive 07/17/2011   Past Medical  History:  Diagnosis Date  . Asthma    "on daily RX and rescue inhaler" (04/18/2018)  . Chronic diastolic CHF (congestive heart failure) (HCC) 09/2015  . Chronic lower back pain   . Chronic neck pain   . Chronic pain syndrome    Fentanyl Patch  . Colon polyps   . Coronary artery disease    cath with normal LM, 30% LAD, 85% mid RCA and 95% distal RCA s/p PCI of the mid to distal RCA and now on DAPT with ASA and Ticagrelor.    . Eczema   . Excessive daytime sleepiness 11/26/2015  . Fibromyalgia   . Gallstones   . GERD (gastroesophageal reflux disease)   . Heart murmur    "noted for the 1st time on 04/18/2018"  . History of blood transfusion 07/2010   "S/P oophorectomy"  . History of gout   . History of hiatal hernia 1980s   "gone now" (04/18/2018)  . Hyperlipidemia   . Hypertension    takes Metoprolol and Enalapril daily  . Hypothyroidism    takes Synthroid daily  .  IBS (irritable bowel syndrome)   . Migraine    "nothing in the 2000s" (04/18/2018)  . Mixed connective tissue disease (HCC)   . NAFLD (nonalcoholic fatty liver disease)   . Pneumonia    "several times" (04/18/2018)  . Rheumatoid arthritis (HCC)    "hands, elbows, shoulders, probably knees" (04/18/2018)  . Scoliosis   . Spondylosis   . Type II diabetes mellitus (HCC)    takes Metformin and Hum R daily (04/18/2018)    Family History  Problem Relation Age of Onset  . CAD Mother   . Hypertension Mother   . Heart attack Mother   . CAD Father   . Heart attack Father   . Allergic rhinitis Father   . Asthma Father   . Hypertension Brother   . Hypertension Brother   . Pancreatic cancer Paternal Aunt   . Breast cancer Paternal Aunt   . Lung cancer Paternal Aunt     Past Surgical History:  Procedure Laterality Date  . ABDOMINAL HYSTERECTOMY  06/2005   "w/right ovariy"  . AMPUTATION Right 01/28/2020    RIGHT FOURTH AND FIFTH RAY AMPUTATION  . AMPUTATION Right 01/28/2020   Procedure: RIGHT FOURTH AND FIFTH RAY  AMPUTATION,;  Surgeon: [name], MD;  Location: [facility] OR;  Service: Orthopedics;  Laterality: Right;  . APPENDECTOMY    . BREAST BIOPSY Bilateral    9 total (04/18/2018)  . CORONARY ANGIOPLASTY WITH STENT PLACEMENT  10/01/2015   normal LM, 30% LAD, 85% mid RCA and 95% distal RCA s/p PCI of the mid to distal RCA and now on DAPT with ASA and Ticagrelor.    . CORONARY STENT INTERVENTION Right 04/18/2018   Procedure: CORONARY STENT INTERVENTION;  Surgeon: [name], MD;  Location: [facility] INVASIVE CV LAB;  Service: Cardiovascular;  Laterality: Right;  . DILATION AND CURETTAGE OF UTERUS    . FRACTURE SURGERY    . LAPAROSCOPIC CHOLECYSTECTOMY    . LEFT HEART CATH AND CORONARY ANGIOGRAPHY N/A 04/18/2018   Procedure: LEFT HEART CATH AND CORONARY ANGIOGRAPHY;  Surgeon: [name], MD;  Location: [facility] INVAS[facility] CV LAB;  Service: Cardiovascular;  Laterality: N/A;  . MUSCLE BIOPSY Left    "leg"  . OOPHORECTOMY  07/2010  . RADIOLOGY WITH ANESTHESIA N/A 05/25/2016   Procedure: RADIOLOGY WITH ANESTHESIA;  Surgeon: Medication Radiologist, MD;  Location: [facility] OR;  Service: Radiology;  Laterality: N/A;  . TONSILLECTOMY AND ADENOIDECTOMY    . WRIST FRACTURE SURGERY Left    "crushed it"   Social History   Occupational History  . Not on file  Tobacco Use  . Smoking status: Former Smoker    Packs/day: 1.00    Years: 20.00    Pack years: 20.00    Types: Cigarettes    Quit date: 02/11/2004    Years since quitting: 16.2  . Smokeless tobacco: Never Used  Vaping Use  . Vaping Use: Never used  Substance and Sexual Activity  . Alcohol use: Yes    Comment: RARE  . Drug use: Not Currently    Types: Marijuana    Comment: "only in my teens"  . Sexual activity: Yes    Birth control/protection: Surgical

## 2020-05-14 ENCOUNTER — Ambulatory Visit: Payer: 59 | Admitting: Gastroenterology

## 2020-05-17 NOTE — Progress Notes (Unsigned)
Patient ID: [name]                 DOB: December 18, 1960                    MRN: [id number]     HPI: [name] is a 59 y.o. female patient referred to lipid clinic by Dr. Mayford Knife. PMH is significant for CHF, HTN, CAD s/p PCI (2019), Afib, DM, HLD, Asthma, GERD, NAFLD, former smoker (quit 2005), RA.  Patient presents today ***  Compliance? Muscle pain? Meds tried in the past and any side effects? Go over labs and goals Lipid therapy options -PCSK9-Inhibitor Diet?? Exercise??   Confirm insurance coverage: [employer] vs [employer]?? -[employer] - repatha and praluent (tier 5-specialty) -[employer] - repatha (tier 3)  Copay card needded  Insurance coverage? Need for PAP?  Order Lab F/u appt in 3 months (lipid panel and LFTs)   Current Medications: none Intolerances: rosuvastatin 40 mg and lovastatin (extreme joint pain) Risk Factors: HTN, HLD, CAD, DM, former smoker, CHF, Fhx of CAD   LDL goal: <70 mg/dL  Diet:   Exercise:   Family History: CAD in her father and mother; Heart attack in her father and mother; Hypertension in her brother, brother, and mother  Social History: Former smoker (20 ppy; quit 2005); reports alcohol use; reports previous marijuana use   Labs: 05/04/20: LDL 100, TG 204, TC 176, HDL 41 04/08/18: LDL 146, TG 116, TC 217, HDL 48  Past Medical History:  Diagnosis Date  . Asthma    "on daily RX and rescue inhaler" (04/18/2018)  . Chronic diastolic CHF (congestive heart failure) (HCC) 09/2015  . Chronic lower back pain   . Chronic neck pain   . Chronic pain syndrome    Fentanyl Patch  . Colon polyps   . Coronary artery disease    cath with normal LM, 30% LAD, 85% mid RCA and 95% distal RCA s/p PCI of the mid to distal RCA and now on DAPT with ASA and Ticagrelor.    . Eczema   . Excessive daytime sleepiness 11/26/2015  . Fibromyalgia   . Gallstones   . GERD (gastroesophageal reflux disease)   . Heart murmur    "noted for the 1st time on  04/18/2018"  . History of blood transfusion 07/2010   "S/P oophorectomy"  . History of gout   . History of hiatal hernia 1980s   "gone now" (04/18/2018)  . Hyperlipidemia   . Hypertension    takes Metoprolol and Enalapril daily  . Hypothyroidism    takes Synthroid daily  . IBS (irritable bowel syndrome)   . Migraine    "nothing in the 2000s" (04/18/2018)  . Mixed connective tissue disease (HCC)   . NAFLD (nonalcoholic fatty liver disease)   . Pneumonia    "several times" (04/18/2018)  . Rheumatoid arthritis (HCC)    "hands, elbows, shoulders, probably knees" (04/18/2018)  . Scoliosis   . Spondylosis   . Type II diabetes mellitus (HCC)    takes Metformin and Hum R daily (04/18/2018)    Current Outpatient Medications on File Prior to Visit  Medication Sig Dispense Refill  . acetaminophen (TYLENOL) 650 MG CR tablet Take 1,300 mg by mouth in the morning and at bedtime.    Marland Kitchen albuterol (PROVENTIL) (2.5 MG/3ML) 0.083% nebulizer solution Take 3 mLs (2.5 mg total) by nebulization every 4 (four) hours as needed for wheezing or shortness of breath. 300 mL 0  .  aspirin EC 81 MG tablet Take 81 mg by mouth daily.    Marland Kitchen augmented betamethasone dipropionate (DIPROLENE-AF) 0.05 % cream APPLY 1 CREAM TOPICALLY ONCE DAILY AS NEEDED FOR  ECZEMA 15 g 3  . clotrimazole-betamethasone (LOTRISONE) cream Apply 1 application topically 2 (two) times daily as needed (eczema).    . diltiazem (DILT-XR) 240 MG 24 hr capsule Take 1 capsule (240 mg total) by mouth daily. 90 capsule 3  . diphenhydrAMINE (BENADRYL) 25 MG tablet Take 25 mg by mouth daily as needed for allergies.    Marland Kitchen doxycycline (VIBRA-TABS) 100 MG tablet Take 1 tablet (100 mg total) by mouth 2 (two) times daily. 30 tablet 0  . EPINEPHrine (EPIPEN 2-PAK) 0.3 mg/0.3 mL IJ SOAJ injection USE AS DIRECTED FOR SEVERE ALLERGIC REACTION. 2 each 0  . fluconazole (DIFLUCAN) 150 MG tablet TAKE 1 TABLET BY MOUTH REPEAT IN 48 HOURS IF NO IMPROVEMENT 2 tablet 0  .  fluticasone (FLONASE) 50 MCG/ACT nasal spray USE 2 SPRAY(S) IN EACH NOSTRIL ONCE DAILY AS NEEDED FOR  ALLERGIES  OR  RHINITIS 16 g 0  . furosemide (LASIX) 20 MG tablet Take 0.5 tablets (10 mg total) by mouth daily. As needed for swelling 30 tablet 3  . gabapentin (NEURONTIN) 100 MG capsule Take 1 capsule (100 mg total) by mouth 3 (three) times daily. Pt takes 400mg  in the morning, 400mg  at lunchtime and 700mg  in the evening    . glucose blood (BAYER CONTOUR TEST) test strip 1 each 4 (four) times daily.     . Guaifenesin (MUCINEX MAXIMUM STRENGTH) 1200 MG TB12 Take 1,200 mg by mouth 2 (two) times daily.    guaiFENesin-dextromethorphan (ROBITUSSIN DM) 100-10 MG/5ML syrup Take 5 mLs by mouth every 4 (four) hours as needed for cough.    . hydroxychloroquine (PLAQUENIL) 200 MG tablet Take 1 tablet (200 mg total) by mouth 2 (two) times daily. 180 tablet 0  . insulin aspart (NOVOLOG FLEXPEN) 100 UNIT/ML FlexPen Inject 40-100 Units into the skin 3 (three) times daily with meals. 15 mL 2  . insulin regular human CONCENTRATED (HUMULIN R) 500 UNIT/ML injection Inject 0.08-0.3 mLs (40-150 Units total) into the skin 3 (three) times daily with meals. 20 mL 4  . isosorbide mononitrate (IMDUR) 30 MG 24 hr tablet Take 1 tablet (30 mg total) by mouth daily. 90 tablet 3  . levocetirizine (XYZAL) 5 MG tablet TAKE 1 TABLET BY MOUTH ONCE DAILY IN THE EVENING 90 tablet 1  . levothyroxine (SYNTHROID) 200 MCG tablet Take 1 tablet (200 mcg total) by mouth daily before breakfast. 90 tablet 1  . magnesium oxide (MAG-OX) 400 MG tablet Take 400-1,600 mg by mouth 2 (two) times daily as needed (cramps).    . metFORMIN (GLUCOPHAGE) 500 MG tablet Take 1 tablet (500 mg total) by mouth 2 (two) times daily with a meal. 60 tablet 3  . mupirocin ointment (BACTROBAN) 2 % Apply 1 application topically 2 (two) times daily. Apply to the affected area 2 times a day 22 g 3  . nitroGLYCERIN (NITROSTAT) 0.4 MG SL tablet DISSOLVE ONE TABLET UNDER  THE TONGUE EVERY 5 MINUTES AS NEEDED FOR CHEST PAIN.  DO NOT EXCEED A TOTAL OF 3 DOSES IN 15 MINUTES 25 tablet 0  . ondansetron (ZOFRAN) 4 MG tablet Take 8 mg by mouth every 6 (six) hours as needed for nausea or vomiting.    . Oxycodone HCl 10 MG TABS Take 10 mg by mouth every 6 (six) hours as needed (pain).      oxymetazoline (AFRIN) 0.05 % nasal spray Place 1 spray into both nostrils 2 (two) times daily as needed for congestion.    . pantoprazole (PROTONIX) 40 MG tablet Take 1 tablet (40 mg total) by mouth daily. Must call and make follow up appt for further refills. 1st attempt 90 tablet 0  . pentoxifylline (TRENTAL) 400 MG CR tablet Take 1 tablet (400 mg total) by mouth 3 (three) times daily with meals. 90 tablet 3  . Phenylephrine HCl (NEO-SYNEPHRINE NA) Place 1 spray into both nostrils daily as needed (congestion).    . pilocarpine (SALAGEN) 5 MG tablet Take 1 tablet (5 mg total) by mouth 2 (two) times daily. 60 tablet 2  . Polyethyl Glycol-Propyl Glycol (SYSTANE ULTRA OP) Place 2 drops into both eyes daily as needed (dry eyes).    . potassium chloride (KLOR-CON) 10 MEQ tablet Take 1 tab for every 20 mg of Lasix taken. 180 tablet 2  . pramipexole (MIRAPEX) 0.5 MG tablet Take 0.5 mg by mouth See admin instructions. Take 0.5 mg up to 6 times daily as needed for restless legs    . PROAIR HFA 108 (90 Base) MCG/ACT inhaler INHALE 2 PUFFS BY MOUTH EVERY 4 HOURS AS NEEDED FOR WHEEZING AND FOR SHORTNESS OF BREATH 18 g 0  . promethazine (PHENERGAN) 25 MG tablet TAKE 1 TABLET BY MOUTH EVERY 8 HOURS AS NEEDED FOR NAUSEA AND FOR VOMITING 30 tablet 0  . saccharomyces boulardii (FLORASTOR) 250 MG capsule Take 250 mg by mouth 3 (three) times daily.    . valsartan (DIOVAN) 160 MG tablet Take 0.5 tablets (80 mg total) by mouth daily. 30 tablet 2  . Vitamin D, Ergocalciferol, (DRISDOL) 1.25 MG (50000 UNIT) CAPS capsule Take 1 capsule (50,000 Units total) by mouth every 7 (seven) days. Take on Friday 12 capsule 3   . [DISCONTINUED] mupirocin ointment (BACTROBAN) 2 % APPLY OINTMENT INTO THE NOSE 2(TWO) TIMES DAILY 22 g 0   No current facility-administered medications on file prior to visit.    Allergies  Allergen Reactions  . Fish Allergy Anaphylaxis    INCLUDES OMEGA 3 OILS  . Fish Oil Anaphylaxis and Swelling    THROAT SWELLS INCLUDES FISH AS A CLASS  . Omega-3 Fatty Acids Swelling    Throat swelling    . Crestor [Rosuvastatin] Other (See Comments)    Extreme joint pain, to this & other statins  . Gabapentin Anxiety    Anxiety on high doses  . Lovastatin Other (See Comments)    EXTREME JOINT PAIN  . Metronidazole Nausea Only and Swelling    Headache and shakes Nausea and vomiting  . Red Yeast Rice [Cholestin] Other (See Comments)    Muscle pain and severe joint pain.   . Rotigotine Swelling    Extreme edema   . Atrovent Hfa [Ipratropium Bromide Hfa] Other (See Comments)    wheezing  . Enalapril     Cough  . Red Yeast Rice Extract     Joint pain  . Victoza [Liraglutide]     Unsure of reaction   . Suprep [Na Sulfate-K Sulfate-Mg Sulf] Nausea And Vomiting  . Tape Other (See Comments)    Electrodes causes skin breakdown    Assessment/Plan:  1. Hyperlipidemia -   Fabio Neighbors, PharmD PGY2 Ambulatory Care Pharmacy Resident [facility] [address], [place], [place] [address] Phone: [phone]; Fax: [phone]

## 2020-05-18 ENCOUNTER — Ambulatory Visit: Payer: 59

## 2020-05-18 ENCOUNTER — Encounter: Payer: Self-pay | Admitting: Family Medicine

## 2020-05-19 ENCOUNTER — Telehealth: Payer: Self-pay | Admitting: Orthopedic Surgery

## 2020-05-19 ENCOUNTER — Other Ambulatory Visit: Payer: Self-pay | Admitting: Family Medicine

## 2020-05-19 DIAGNOSIS — J45909 Unspecified asthma, uncomplicated: Secondary | ICD-10-CM

## 2020-05-19 NOTE — Telephone Encounter (Signed)
Marylene Land (PT) called from [facility] wanted to notify Dr. Lajoyce Corners that patient canceled today's PT session 04/18/20. No call back need. Patient next session is Friday 04/20/20.

## 2020-05-19 NOTE — Telephone Encounter (Signed)
Noted  

## 2020-05-20 ENCOUNTER — Telehealth: Payer: Self-pay

## 2020-05-20 NOTE — Telephone Encounter (Signed)
IC verbal order given.  °

## 2020-05-20 NOTE — Telephone Encounter (Signed)
Mare Loan from [facility] called she wants to get a verbal order for skilled nursing and PT starting 06/03/2020. Call [phone]

## 2020-05-25 ENCOUNTER — Ambulatory Visit (INDEPENDENT_AMBULATORY_CARE_PROVIDER_SITE_OTHER): Payer: 59 | Admitting: Internal Medicine

## 2020-05-25 ENCOUNTER — Other Ambulatory Visit: Payer: Self-pay

## 2020-05-25 ENCOUNTER — Encounter: Payer: Self-pay | Admitting: Internal Medicine

## 2020-05-25 VITALS — BP 156/78 | HR 102 | Temp 97.0°F | Ht 62.0 in | Wt 327.6 lb

## 2020-05-25 DIAGNOSIS — R0602 Shortness of breath: Secondary | ICD-10-CM

## 2020-05-25 DIAGNOSIS — R05 Cough: Secondary | ICD-10-CM

## 2020-05-25 DIAGNOSIS — R053 Chronic cough: Secondary | ICD-10-CM

## 2020-05-25 MED ORDER — ESOMEPRAZOLE MAGNESIUM 40 MG PO CPDR: 40.0000 mg | DELAYED_RELEASE_CAPSULE | Freq: Two times a day (BID) | ORAL | 5 refills | Status: DC

## 2020-05-25 NOTE — Patient Instructions (Addendum)
The patient should have follow up scheduled with myself in 3 months.   Prior to next visit patient should have:  FeNO Full set of PFTs  Flonase - 1 spray on each side of your nose twice a day  Instructions for use:  If you also use a saline nasal spray or rinse, use that first.  Position the head with the chin slightly tucked. Use the right hand to spray into the left nostril and the right hand to spray into the left nostril.   Point the bottle away from the septum of your nose (cartilage that divides the two sides of your nose).   Hold the nostril closed on the opposite side from where you will spray  Spray once and gently sniff to pull the medicine into the higher parts of your nose.  Don't sniff too hard as the medicine will drain down the back of your throat instead.  Repeat with a second spray on the same side if prescribed.  Repeat on the other side of your nose.   What is GERD? Gastroesophageal reflux disease (GERD) is gastroesophageal reflux diseasewhich occurs when the lower esophageal sphincter (LES) opens spontaneously, for varying periods of time, or does not close properly and stomach contents rise up into the esophagus. GER is also called acid reflux or acid regurgitation, because digestive juices--called acids--rise up with the food. The esophagus is the tube that carries food from the mouth to the stomach. The LES is a ring of muscle at the bottom of the esophagus that acts like a valve between the esophagus and stomach.  When acid reflux occurs, food or fluid can be tasted in the back of the mouth. When refluxed stomach acid touches the lining of the esophagus it may cause a burning sensation in the chest or throat called heartburn or acid indigestion. Occasional reflux is common. Persistent reflux that occurs more than twice a week is considered GERD, and it can eventually lead to more serious health problems. People of all ages can have GERD. Studies have shown that GERD  may worsen or contribute to asthma, chronic cough, and pulmonary fibrosis.   What are the symptoms of GERD? The main symptom of GERD in adults is frequent heartburn, also called acid indigestion--burning-type pain in the lower part of the mid-chest, behind the breast bone, and in the mid-abdomen.  Not all reflux is acidic in nature, and many patients don't have heart burn at all. Sometimes it feels like a cough (either dry or with mucus), choking sensation, asthma, shortness of breath, waking up at night, frequent throat clearing, or trouble swallowing.    What causes GERD? The reason some people develop GERD is still unclear. However, research shows that in people with GERD, the LES relaxes while the rest of the esophagus is working. Anatomical abnormalities such as a hiatal hernia may also contribute to GERD. A hiatal hernia occurs when the upper part of the stomach and the LES move above the diaphragm, the muscle wall that separates the stomach from the chest. Normally, the diaphragm helps the LES keep acid from rising up into the esophagus. When a hiatal hernia is present, acid reflux can occur more easily. A hiatal hernia can occur in people of any age and is most often a normal finding in otherwise healthy people over age 56. Most of the time, a hiatal hernia produces no symptoms.   Other factors that may contribute to GERD include - Obesity or recent weight gain - Pregnancy  -  Smoking  - Diet - Certain medications  Common foods that can worsen reflux symptoms include: - carbonated beverages - artificial sweeteners - citrus fruits  - chocolate  - drinks with caffeine or alcohol  - fatty and fried foods  - garlic and onions  - mint flavorings  - spicy foods  - tomato-based foods, like spaghetti sauce, salsa, chili, and pizza   Lifestyle Changes If you smoke, stop.  Avoid foods and beverages that worsen symptoms (see above.) Lose weight if needed.  Eat small, frequent meals.   Wear loose-fitting clothes.  Avoid lying down for 3 hours after a meal.  Raise the head of your bed 6 to 8 inches by securing wood blocks under the bedposts. Just using extra pillows will not help, but using a wedge-shaped pillow may be helpful.  Medications  H2 blockers, such as cimetidine (Tagamet HB), famotidine (Pepcid AC), nizatidine (Axid AR), and ranitidine (Zantac 75), decrease acid production. They are available in prescription strength and over-the-counter strength. These drugs provide short-term relief and are effective for about half of those who have GERD symptoms.  Proton pump inhibitors include omeprazole (Prilosec, Zegerid), lansoprazole (Prevacid), pantoprazole (Protonix), rabeprazole (Aciphex), and esomeprazole (Nexium), which are available by prescription. Prilosec is also available in over-the-counter strength. Proton pump inhibitors are more effective than H2 blockers and can relieve symptoms and heal the esophageal lining in almost everyone who has GERD.  Because drugs work in different ways, combinations of medications may help control symptoms. People who get heartburn after eating may take both antacids and H2 blockers. The antacids work first to neutralize the acid in the stomach, and then the H2 blockers act on acid production. By the time the antacid stops working, the H2 blocker will have stopped acid production. Your health care provider is the best source of information about how to use medications for GERD.   Points to Remember 1. You can have GERD without having heartburn. Your symptoms could include a dry cough, asthma symptoms, or trouble swallowing.  2. Taking medications daily as prescribed is important in controlling you symptoms.  Sometimes it can take up to 8 weeks to fully achieve the effects of the medications prescribed.  3. Coughing related to GERD can be difficult to treat and is very frustrating!  However, it is important to stick with these  medications and lifestyle modifications before pursuing more aggressive or invasive test and treatments.

## 2020-05-25 NOTE — Progress Notes (Signed)
[name]    [id number]    Jul 04, 1961  Primary Care Physician:Wendling, Jilda Roche, DO  Referring Physician: Sharlene Dory, DO [address] [place],  [place] [address] Reason for Consultation: "moderate persistent asthma" Date of Consultation: 05/25/2020  Chief complaint:   Chief Complaint  Patient presents with  . Consult    Coughing, wheezing, shortness of breath , feels Asthma is out of control uses albuterol inhaler and nebulizer been out of control with a cough dec 2020 around Christmas     HPI:  [name] is a 59 y.o. woman with history of CAD s/p PCI, HTN, DM and former tobacco use who presents for new patient referral for asthma. History of childhood asthma. Winter-Spring 2021 had 5 rounds of abx and steroids for episodic coughing and dyspnea with minimal benefit. 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 While hospitalized was told her wheezing was coming from her neck. Takes albuterol nebs and inhaler which don't consistently help. Stopping ACE-I has helped the cough somewhat (stopped it 5-6 weeks ago,) but still has cough and dyspnea. Takes albuterol prn for chest tightness and shortness of breath with intermittent help. Cough worse with talking.  Takes Affrin daily and flonase daily.  Montelukast doesn't help her.   Has been seen by allergy in 2018 and had testing. Was on Qvar back then and it didn't seem to help.  Has had OSA eval in 2017 which was negative. Doesn't sleep well due to pain.   Current Regimen: albuterol (more than 5-6 times/day.) also takes albuterol nebulizer three times/day, levocetrizine, affrin Asthma Triggers: exertion, cats, environmental allergies Exacerbations in the last year: 5-6 History of hospitalization or intubation: Hives/anaphylaxis: has anaphylaxis to [facility].  Allergy Testing: had it about 5 years ago.  GERD: yes, not on treatment.  Allergic Rhinitis: takes  antihistamine, flonase, affrin.  ACT: No flowsheet data found. FeNO: never had  Social history: Occupation: worked in Therapist, sports, Armed forces operational officer Exposures: pet poodle at home.   Social History   Occupational History  . Not on file  Tobacco Use  . Smoking status: Former Smoker    Packs/day: 1.00    Years: 19.00    Pack years: 19.00    Types: Cigarettes    Start date: 75    Quit date: 02/11/2004    Years since quitting: 16.2  . Smokeless tobacco: Never Used  Vaping Use  . Vaping Use: Never used  Substance and Sexual Activity  . Alcohol use: Yes    Comment: RARE  . Drug use: Not Currently    Types: Marijuana    Comment: "only in my teens"  . Sexual activity: Yes    Birth control/protection: Surgical    Relevant family history:  Family History  Problem Relation Age of Onset  . CAD Mother   . Hypertension Mother   . Heart attack Mother   . CAD Father   . Heart attack Father   . Allergic rhinitis Father   . Asthma Father   . Hypertension Brother   . Hypertension Brother   . Pancreatic cancer Paternal Aunt   . Breast cancer Paternal Aunt   . Lung cancer Paternal Aunt   . Asthma Son     Past Medical History:  Diagnosis Date  . Asthma    "on daily RX and rescue inhaler" (04/18/2018)  . Chronic diastolic CHF (congestive heart failure) (HCC) 09/2015  .  Chronic lower back pain   . Chronic neck pain   . Chronic pain syndrome    Fentanyl Patch  . Colon polyps   . Coronary artery disease    cath with normal LM, 30% LAD, 85% mid RCA and 95% distal RCA s/p PCI of the mid to distal RCA and now on DAPT with ASA and Ticagrelor.    . Eczema   . Excessive daytime sleepiness 11/26/2015  . Fibromyalgia   . Gallstones   . GERD (gastroesophageal reflux disease)   . Heart murmur    "noted for the 1st time on 04/18/2018"  . History of blood transfusion 07/2010   "S/P oophorectomy"  . History of gout   . History of hiatal hernia 1980s   "gone now" (04/18/2018)    . Hyperlipidemia   . Hypertension    takes Metoprolol and Enalapril daily  . Hypothyroidism    takes Synthroid daily  . IBS (irritable bowel syndrome)   . Migraine    "nothing in the 2000s" (04/18/2018)  . Mixed connective tissue disease (HCC)   . NAFLD (nonalcoholic fatty liver disease)   . Pneumonia    "several times" (04/18/2018)  . Rheumatoid arthritis (HCC)    "hands, elbows, shoulders, probably knees" (04/18/2018)  . Scoliosis   . Spondylosis   . Type II diabetes mellitus (HCC)    takes Metformin and Hum R daily (04/18/2018)    Past Surgical History:  Procedure Laterality Date  . ABDOMINAL HYSTERECTOMY  06/2005   "w/right ovariy"  . AMPUTATION Right 01/28/2020    RIGHT FOURTH AND FIFTH RAY AMPUTATION  . AMPUTATION Right 01/28/2020   Procedure: RIGHT FOURTH AND FIFTH RAY AMPUTATION,;  Surgeon: [name], MD;  Location: [facility] OR;  Service: Orthopedics;  Laterality: Right;  . APPENDECTOMY    . BREAST BIOPSY Bilateral    9 total (04/18/2018)  . CORONARY ANGIOPLASTY WITH STENT PLACEMENT  10/01/2015   normal LM, 30% LAD, 85% mid RCA and 95% distal RCA s/p PCI of the mid to distal RCA and now on DAPT with ASA and Ticagrelor.    . CORONARY STENT INTERVENTION Right 04/18/2018   Procedure: CORONARY STENT INTERVENTION;  Surgeon: [name], MD;  Location: [facility];  Service: Cardiovascular;  Laterality: Right;  . DILATION AND CURETTAGE OF UTERUS    . FRACTURE SURGERY    . LAPAROSCOPIC CHOLECYSTECTOMY    . LEFT HEART CATH AND CORONARY ANGIOGRAPHY N/A 04/18/2018   Procedure: LEFT HEART CATH AND CORONARY ANGIOGRAPHY;  Surgeon: [name], MD;  Location: [facility];  Service: Cardiovascular;  Laterality: N/A;  . MUSCLE BIOPSY Left    "leg"  . OOPHORECTOMY  07/2010  . RADIOLOGY WITH ANESTHESIA N/A 05/25/2016   Procedure: RADIOLOGY WITH ANESTHESIA;  Surgeon: Medication Radiologist, MD;  Location: [facility] OR;  Service: Radiology;  Laterality: N/A;  .  TONSILLECTOMY AND ADENOIDECTOMY    . WRIST FRACTURE SURGERY Left    "crushed it"     Physical Exam: Blood pressure (!) 156/78, pulse (!) 102, temperature (!) 97 F (36.1 C), temperature source Temporal, height 5\' 2"  (1.575 m), weight (!) 327 lb 9.6 oz (148.6 kg), SpO2 96 %. Gen:      No acute distress, obese ENT:  no nasal polyps, mucus membranes moist, +cobblestoning in the oropharynx Lungs:    No increased respiratory effort, symmetric chest wall excursion, clear to auscultation bilaterally, no wheezes or crackles CV:  Regular rate and rhythm; no murmurs, rubs, or gallops.  Trace pedal edema Abd:      Obese, + bowel sounds; soft, non-tender; no distension MSK: no acute synovitis of DIP or PIP joints, no mechanics hands. Right foot in ortho shoe Skin:      Warm and dry; no rashes Neuro: normal speech, no focal facial asymmetry Psych: alert and oriented x3, normal mood and affect   Data Reviewed/Medical Decision Making:  Independent interpretation of tests: Imaging: . Review of patient's chest xray images revealed cardiomegaly. No pneumonia or effusion. The patient's images have been independently reviewed by me.  Chest xray May 2021.   PFTs: I have personally reviewed the patient's PFTs and spirometry from 2018 shows no airflow limitation, suggests mild restriction.   Echo 2019 - Procedure narrative: Transthoracic echocardiography. Image  quality was suboptimal. The study was technically difficult.  Intravenous contrast (Definity) was administered.  - Left ventricle: The cavity size was normal. Systolic function was  normal. The estimated ejection fraction was in the range of 60%  to 65%. Wall motion was normal; there were no regional wall  motion abnormalities. Left ventricular diastolic function  parameters were normal.  - Aortic valve: Trileaflet; mildly thickened, mildly calcified  leaflets.  - Mitral valve: Poorly visualized. Calcified annulus.  -  Left atrium: The atrium was mildly dilated.  - Tricuspid valve: Poorly visualized.  - Pulmonic valve: Poorly visualized.  - Pulmonary arteries: Systolic pressure could not be accurately  estimated.   Labs:  Lab Results  Component Value Date   WBC 7.4 05/04/2020   HGB 10.5 (L) 05/04/2020   HCT 35.4 05/04/2020   MCV 79 05/04/2020   PLT 292 05/04/2020   Lab Results  Component Value Date   NA 138 05/04/2020   K 4.5 05/04/2020   CL 98 05/04/2020   CO2 21 05/04/2020     Immunization status:  Immunization History  Administered Date(s) Administered  . Influenza,inj,Quad PF,6+ Mos 06/30/2015  . Pneumococcal Polysaccharide-23 02/25/2014    . I reviewed prior external note(s) from Cardiology, hospital stay . I reviewed the result(s) of the labs and imaging as noted above.  . I have ordered PFTs   Assessment:  Shortness of Breath Chronic Cough  Plan/Recommendations: Stop Afrin. Increase flonase to BID.  Will start PPI trial for GERD for cough.  Doubt all of her symptoms are related to asthma since she doesn't respond well to prednisone and albuterol. Will obtain PFTs and FeNO Consider OSA  We discussed disease management and progression at length today.   I spent 45 minutes in the care of this patient today including pre-charting, chart review, review of results, face-to-face care, coordination of care and communication with consultants etc.).  Return to Care: Return in about 3 months (around 08/24/2020).  Durel Salts, MD Pulmonary and Critical Care Medicine [facility] Office:[phone]  CC: Sharlene Dory*

## 2020-05-26 ENCOUNTER — Encounter: Payer: Self-pay | Admitting: Family

## 2020-05-26 ENCOUNTER — Ambulatory Visit (INDEPENDENT_AMBULATORY_CARE_PROVIDER_SITE_OTHER): Payer: 59 | Admitting: Family

## 2020-05-26 VITALS — Ht 62.0 in | Wt 327.0 lb

## 2020-05-26 DIAGNOSIS — Z89421 Acquired absence of other right toe(s): Secondary | ICD-10-CM | POA: Diagnosis not present

## 2020-05-26 DIAGNOSIS — M86271 Subacute osteomyelitis, right ankle and foot: Secondary | ICD-10-CM

## 2020-05-26 DIAGNOSIS — T8130XA Disruption of wound, unspecified, initial encounter: Secondary | ICD-10-CM

## 2020-05-26 NOTE — Progress Notes (Signed)
Office Visit Note   Patient: [name]           Date of Birth: 05-26-1961           MRN: [id number] Visit Date: 05/26/2020              Requested by: Sharlene Dory, DO [address] [place],  [place] [address] PCP: Sharlene Dory, DO  Chief Complaint  Patient presents with  . Right Foot - Follow-up    01/28/20 right foot 4th and 5th ray amputation       HPI: Patient is here in follow-up status post right fourth and fifth ray amputation. She has been slow to heal. She has been given nitroglycerin patches and started on Trental. She feels it is improving.  Has had some dermatitis and pain from nitro patches.   Assessment & Plan: Visit Diagnoses:  1. 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 Subacute osteomyelitis, right ankle and foot (HCC)   3. Wound dehiscence     Plan: Continue with dressing changes with mupirocin and Trental and nitroglycerin patches. We will follow up in 2 weeks. She will continue with PT. May advance to regular shoe wear. May full weight bear with PT.   Follow-Up Instructions: Return in about 4 weeks (around 06/23/2020).   Ortho Exam  Patient is alert, oriented, no adenopathy, well-dressed, normal affect, normal respiratory effort. Chronic venous stasis dermatitis on the right lower extremity is stable. Focused examination of the amputation site shows a healthy wound been with 75% granulation 25% fibrinous tissue. Is 15 mm x 1 cm in size. No surrounding maceration, erythema or warmth.   Imaging: No results found. No images are attached to the encounter.  Labs: Lab Results  Component Value Date   HGBA1C 9.0 (H) 01/28/2020   HGBA1C 9.1 (H) 08/15/2019   HGBA1C 8.4 (H) 01/02/2018   ESRSEDRATE 37 (H) 01/02/2018   CRP 9.8 (H) 01/02/2018   REPTSTATUS 02/03/2020 FINAL 01/28/2020   GRAMSTAIN  01/28/2020    ABUNDANT WBC PRESENT,BOTH PMN AND MONONUCLEAR ABUNDANT GRAM POSITIVE  COCCI ABUNDANT GRAM NEGATIVE RODS FEW GRAM POSITIVE RODS    CULT  01/28/2020    FEW PROTEUS MIRABILIS FEW STAPHYLOCOCCUS AUREUS FEW ENTEROCOCCUS FAECALIS ABUNDANT BACTEROIDES SPECIES BETA LACTAMASE NEGATIVE Performed at [facility], [address] [place], [place] [address]    LABORGA PROTEUS MIRABILIS 01/28/2020   LABORGA STAPHYLOCOCCUS AUREUS 01/28/2020   LABORGA ENTEROCOCCUS FAECALIS 01/28/2020     Lab Results  Component Value Date   ALBUMIN 4.0 05/04/2020   ALBUMIN 3.2 (L) 01/21/2020   ALBUMIN 3.8 08/01/2019    Lab Results  Component Value Date   MG 2.2 10/20/2018   MG 2.2 10/19/2018   MG 1.2 (L) 10/17/2018   Lab Results  Component Value Date   VD25OH 69.04 07/25/2017    No results found for: PREALBUMIN CBC EXTENDED Latest Ref Rng & Units 05/04/2020 01/31/2020 01/21/2020  WBC 3.4 - 10.8 x10E3/uL 7.4 9.1 -  RBC 3.77 - 5.28 x10E6/uL 4.50 4.11 -  HGB 11.1 - 15.9 g/dL 10.5(L) 10.1(L) 12.6  HCT 34.0 - 46.6 % 35.4 34.6(L) 37.0  PLT 150 - 450 x10E3/uL 292 340 -  NEUTROABS 1.7 - 7.7 K/uL - - -  LYMPHSABS 0.7 - 4.0 K/uL - - -     Body mass index is 59.81 kg/m.  Orders:  No orders of the defined types were  placed in this encounter.  No orders of the defined types were placed in this encounter.    Procedures: No procedures performed  Clinical Data: No additional findings.  ROS:  All other systems negative, except as noted in the HPI. Review of Systems  Constitutional: Negative for chills and fever.  Cardiovascular: Positive for leg swelling.  Skin: Positive for wound.    Objective: Vital Signs: Ht 5\' 2"  (1.575 m)   Wt (!) 327 lb (148.3 kg)   BMI 59.81 kg/m   Specialty Comments:  No specialty comments available.  PMFS History: Patient Active Problem List   Diagnosis Date Noted  . Sjogren's syndrome (HCC) 02/11/2020  . Acute hematogenous osteomyelitis of right foot (HCC) 01/28/2020  . Cutaneous abscess of right foot   . Subacute  osteomyelitis, right ankle and foot (HCC)   . Statin intolerance 08/16/2019  . PAF (paroxysmal atrial fibrillation) (HCC) 01/06/2019  . Gram-negative bacteremia 10/18/2018  . Streptococcal bacteremia 10/18/2018  . Ulcer of lower extremity, limited to breakdown of skin (HCC) 10/03/2018  . CAD S/P percutaneous coronary angioplasty 04/19/2018  . Essential hypertension   . Moderate persistent asthma 03/21/2018  . NAFLD (nonalcoholic fatty liver disease)   . Recurrent cellulitis of lower extremity 01/02/2018  . Cellulitis of lower leg 01/02/2018  . Chronic diarrhea 05/08/2017  . H/O Clostridium difficile infection 05/08/2017  . Rectal bleeding 05/08/2017  . Hyperbilirubinemia 04/29/2016  . Hyponatremia 04/29/2016  . Cellulitis of leg, right 03/09/2016  . Mild persistent asthma 02/15/2016  . Allergic rhinitis due to pollen 02/15/2016  . Anaphylactic reaction due to food 02/10/2016  . Diabetes mellitus type 2 in obese (HCC) 02/10/2016  . Coronary artery disease involving native coronary artery of native heart without angina pectoris 02/10/2016  . Gastroesophageal reflux disease without esophagitis 02/10/2016  . Atopic eczema 02/10/2016  . Cervical nerve root disorder 12/15/2015  . Lumbar radiculopathy 12/15/2015  . Daytime somnolence 11/26/2015  . Venous stasis dermatitis of both lower extremities 02/11/2015  . Morbid obesity (HCC) 06/19/2014  . B-complex deficiency 03/26/2014  . Benign essential HTN 03/26/2014  . Chronic pain associated with significant psychosocial dysfunction 03/26/2014  . Diaphragmatic hernia 03/26/2014  . Gastroesophageal reflux disease 03/26/2014  . Hammer toe 03/26/2014  . H/O neoplasm 03/26/2014  . 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 Diabetic polyneuropathy (HCC) 03/26/2014  . Deafness, sensorineural 03/26/2014  . Fibromyalgia 01/20/2014  . 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 Insulin dependent diabetes mellitus (HCC) 11/11/2012  .  Hyperlipidemia LDL goal <70 11/11/2012  . Mixed connective tissue disease (HCC) 11/11/2012  . Hypothyroidism 11/11/2012  . Uncomplicated asthma 11/11/2012  . 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HCC) 02/20/2012  . 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 ANA positive 07/17/2011   Past Medical History:  Diagnosis Date  . Asthma    "on daily RX and rescue inhaler" (04/18/2018)  . Chronic diastolic CHF (congestive heart failure) (HCC) 09/2015  . Chronic lower back pain   . Chronic neck pain   . Chronic pain syndrome    Fentanyl Patch  . Colon polyps   . Coronary artery disease    cath with normal LM, 30% LAD, 85% mid RCA and 95% distal RCA s/p PCI of the mid to distal RCA and now on DAPT with ASA and Ticagrelor.    . Eczema   . Excessive daytime sleepiness 11/26/2015  . Fibromyalgia   . Gallstones   . GERD (gastroesophageal reflux disease)   .  Heart murmur    "noted for the 1st time on 04/18/2018"  . History of blood transfusion 07/2010   "S/P oophorectomy"  . History of gout   . History of hiatal hernia 1980s   "gone now" (04/18/2018)  . Hyperlipidemia   . Hypertension    takes Metoprolol and Enalapril daily  . Hypothyroidism    takes Synthroid daily  . IBS (irritable bowel syndrome)   . Migraine    "nothing in the 2000s" (04/18/2018)  . Mixed connective tissue disease (HCC)   . NAFLD (nonalcoholic fatty liver disease)   . Pneumonia    "several times" (04/18/2018)  . Rheumatoid arthritis (HCC)    "hands, elbows, shoulders, probably knees" (04/18/2018)  . Scoliosis   . Spondylosis   . Type II diabetes mellitus (HCC)    takes Metformin and Hum R daily (04/18/2018)    Family History  Problem Relation Age of Onset  . CAD Mother   . Hypertension Mother   . Heart attack Mother   . CAD Father   . Heart attack Father   . Allergic rhinitis Father   . Asthma Father   . Hypertension Brother   . Hypertension Brother   .  Pancreatic cancer Paternal Aunt   . Breast cancer Paternal Aunt   . Lung cancer Paternal Aunt   . Asthma Son     Past Surgical History:  Procedure Laterality Date  . ABDOMINAL HYSTERECTOMY  06/2005   "w/right ovariy"  . AMPUTATION Right 01/28/2020    RIGHT FOURTH AND FIFTH RAY AMPUTATION  . AMPUTATION Right 01/28/2020   Procedure: RIGHT FOURTH AND FIFTH RAY AMPUTATION,;  Surgeon: [name], MD;  Location: [facility];  Service: Orthopedics;  Laterality: Right;  . APPENDECTOMY    . BREAST BIOPSY Bilateral    9 total (04/18/2018)  . CORONARY ANGIOPLASTY WITH STENT PLACEMENT  10/01/2015   normal LM, 30% LAD, 85% mid RCA and 95% distal RCA s/p PCI of the mid to distal RCA and now on DAPT with ASA and Ticagrelor.    . CORONARY STENT INTERVENTION Right 04/18/2018   Procedure: CORONARY STENT INTERVENTION;  Surgeon: [name], MD;  Location: [facility];  Service: Cardiovascular;  Laterality: Right;  . DILATION AND CURETTAGE OF UTERUS    . FRACTURE SURGERY    . LAPAROSCOPIC CHOLECYSTECTOMY    . LEFT HEART CATH AND CORONARY ANGIOGRAPHY N/A 04/18/2018   Procedure: LEFT HEART CATH AND CORONARY ANGIOGRAPHY;  Surgeon: [name], MD;  Location: [facility];  Service: Cardiovascular;  Laterality: N/A;  . MUSCLE BIOPSY Left    "leg"  . OOPHORECTOMY  07/2010  . RADIOLOGY WITH ANESTHESIA N/A 05/25/2016   Procedure: RADIOLOGY WITH ANESTHESIA;  Surgeon: Medication Radiologist, MD;  Location: [facility] OR;  Service: Radiology;  Laterality: N/A;  . TONSILLECTOMY AND ADENOIDECTOMY    . WRIST FRACTURE SURGERY Left    "crushed it"   Social History   Occupational History  . Not on file  Tobacco Use  . Smoking status: Former Smoker    Packs/day: 1.00    Years: 19.00    Pack years: 19.00    Types: Cigarettes    Start date: 33    Quit date: 02/11/2004    Years since quitting: 16.2  . Smokeless tobacco: Never Used  Vaping Use  . Vaping Use: Never used  Substance and  Sexual Activity  . Alcohol use: Yes    Comment: RARE  . Drug use: Not  Currently    Types: Marijuana    Comment: "only in my teens"  . Sexual activity: Yes    Birth control/protection: Surgical

## 2020-06-01 ENCOUNTER — Ambulatory Visit: Payer: 59 | Admitting: Cardiology

## 2020-06-01 ENCOUNTER — Telehealth: Payer: Self-pay | Admitting: Orthopedic Surgery

## 2020-06-01 NOTE — Telephone Encounter (Signed)
Called and gave verbal ok for orders below. Pt is s.p a 4th and 5th ray amputation. Lm on vm to call with questions.

## 2020-06-01 NOTE — Telephone Encounter (Signed)
[name] with PT would like to get verbal orders approved with the frequency of twice a week for 3 weeks and then once a week for 3 weeks and finally twice a week for 1 week.   [phone]

## 2020-06-11 ENCOUNTER — Other Ambulatory Visit: Payer: Self-pay | Admitting: Family Medicine

## 2020-06-11 MED ORDER — "INSULIN SYRINGE 29G X 1/2"" 1 ML MISC": 3 refills | Status: DC

## 2020-06-14 ENCOUNTER — Other Ambulatory Visit: Payer: Self-pay | Admitting: Family Medicine

## 2020-06-14 DIAGNOSIS — J4540 Moderate persistent asthma, uncomplicated: Secondary | ICD-10-CM

## 2020-06-17 ENCOUNTER — Telehealth: Payer: Self-pay

## 2020-06-17 NOTE — Telephone Encounter (Signed)
FYI. Pt scheduled tomorrow for testing.

## 2020-06-17 NOTE — Telephone Encounter (Signed)
Pt's spouse called to report pt has been running a temp, at times 101 and has had a cough.  Last night temp was 99. He wanted to speak with Dr. Carmelia Roller or his assistant and I let him know both were out of the office today, but would return tomorrow.  He wanted advice on what to do.  I provided him with the number to call for Covid testing and he also asked me to send a note over to PCP and ask for a call back tomorrow in follow up to this phone call today.  He said he would call the number about scheduling a Covid test for pt.  I also advised UC, but he stated pt has had foot surgery and they prefer to stay out of UC right now.

## 2020-06-18 ENCOUNTER — Other Ambulatory Visit: Payer: 59

## 2020-06-18 ENCOUNTER — Telehealth: Payer: Self-pay | Admitting: Orthopedic Surgery

## 2020-06-18 NOTE — Telephone Encounter (Signed)
Angie from [facility] called. FYI. Says patient canceled PT for this week. She thinks she has COVID. Going to get tested.

## 2020-06-18 NOTE — Telephone Encounter (Signed)
FYI

## 2020-06-23 ENCOUNTER — Ambulatory Visit: Payer: 59 | Admitting: Family

## 2020-06-29 ENCOUNTER — Encounter: Payer: Self-pay | Admitting: General Practice

## 2020-07-02 ENCOUNTER — Ambulatory Visit: Payer: 59 | Admitting: Family

## 2020-07-02 ENCOUNTER — Telehealth: Payer: Self-pay | Admitting: Internal Medicine

## 2020-07-02 NOTE — Telephone Encounter (Addendum)
PA request was received from (pharmacy): [facility] Phone:[phone] [phone]  Medication name and strength: nexium 40 mg Ordering Provider: Durel Salts  Was PA started with [facility]?: yes If yes, please enter KEY: [facility] Medication tried and failed: na Covered Alternatives:   PA sent to plan, time frame for approval / denial: 24-72 Routing to heather for follow-up   Pa was denied on 07/03/2020 paperwork for appeal was initiated on 10/ 26/ 21 left vm for pt to come by sign appeal on medication so office can fax paperwork off

## 2020-07-06 ENCOUNTER — Encounter: Payer: Self-pay | Admitting: Family

## 2020-07-06 ENCOUNTER — Ambulatory Visit (INDEPENDENT_AMBULATORY_CARE_PROVIDER_SITE_OTHER): Payer: 59 | Admitting: Family

## 2020-07-06 DIAGNOSIS — Z89421 Acquired absence of other right toe(s): Secondary | ICD-10-CM

## 2020-07-06 NOTE — Progress Notes (Signed)
Office Visit Note   Patient: [name]           Date of Birth: 05-Aug-1961           MRN: [id number] Visit Date: 07/06/2020              Requested by: Sharlene Dory, DO [address] [place],  [place] [address] PCP: Sharlene Dory, DO  No chief complaint on file.     HPI: Patient is seen today in follow-up status post right fourth and fifth ray amputation. She has been slow to heal. She has been given nitroglycerin patches and started on Trental. She feels it is improving. Did stop using patches because of dermatitis about 4 weeks ago.  Has had some dermatitis and pain to the dorsum of her foot and the lateral column, initially thought it was from nitro patches. Pain to plantar aspect with weight bearing as well. Concerned about some edema to her toes as well.  Assessment & Plan: Visit Diagnoses:  No diagnosis found.  Plan: Continue with dressing changes with silver cell packing. Compression garments for edema. Discussed using OTC steroid cream for dermatitis/eczema to foot for next week. She would like to D/c [facility] nursing, we will ok this.   Follow-Up Instructions: Return in about 6 weeks (around 08/17/2020).   Ortho Exam  Patient is alert, oriented, no adenopathy, well-dressed, normal affect, normal respiratory effort. Chronic venous stasis dermatitis on the right lower extremity is stable. Focused examination of the amputation site shows a healthy wound been with near full granulation tissue is now just 5 mm in diameter with 2 mm of depth. No surrounding maceration. The dorsum of foot and toes as well as lateral column with dermatitis and erythema consistent with atopic dermatitis.    Imaging: No results found. No images are attached to the encounter.  Labs: Lab Results  Component Value Date   HGBA1C 9.0 (H) 01/28/2020   HGBA1C 9.1 (H) 08/15/2019   HGBA1C 8.4 (H) 01/02/2018   ESRSEDRATE 37 (H) 01/02/2018   CRP 9.8 (H) 01/02/2018    REPTSTATUS 02/03/2020 FINAL 01/28/2020   GRAMSTAIN  01/28/2020    ABUNDANT WBC PRESENT,BOTH PMN AND MONONUCLEAR ABUNDANT GRAM POSITIVE COCCI ABUNDANT GRAM NEGATIVE RODS FEW GRAM POSITIVE RODS    CULT  01/28/2020    FEW PROTEUS MIRABILIS FEW STAPHYLOCOCCUS AUREUS FEW ENTEROCOCCUS FAECALIS ABUNDANT BACTEROIDES SPECIES BETA LACTAMASE NEGATIVE Performed at [facility], [address], [place], [place] [address]    LABORGA PROTEUS MIRABILIS 01/28/2020   LABORGA STAPHYLOCOCCUS AUREUS 01/28/2020   LABORGA ENTEROCOCCUS FAECALIS 01/28/2020     Lab Results  Component Value Date   ALBUMIN 4.0 05/04/2020   ALBUMIN 3.2 (L) 01/21/2020   ALBUMIN 3.8 08/01/2019    Lab Results  Component Value Date   MG 2.2 10/20/2018   MG 2.2 10/19/2018   MG 1.2 (L) 10/17/2018   Lab Results  Component Value Date   VD25OH 69.04 07/25/2017    No results found for: PREALBUMIN CBC EXTENDED Latest Ref Rng & Units 05/04/2020 01/31/2020 01/21/2020  WBC 3.4 - 10.8 x10E3/uL 7.4 9.1 -  RBC 3.77 - 5.28 x10E6/uL 4.50 4.11 -  HGB 11.1 - 15.9 g/dL 10.5(L) 10.1(L) 12.6  HCT 34.0 - 46.6 % 35.4 34.6(L) 37.0  PLT 150 - 450 x10E3/uL 292 340 -  NEUTROABS 1.7 - 7.7 K/uL - - -  LYMPHSABS 0.7 - 4.0 K/uL - - -     There  is no height or weight on file to calculate BMI.  Orders:  No orders of the defined types were placed in this encounter.  No orders of the defined types were placed in this encounter.    Procedures: No procedures performed  Clinical Data: No additional findings.  ROS:  All other systems negative, except as noted in the HPI. Review of Systems  Constitutional: Negative for chills and fever.  Cardiovascular: Positive for leg swelling.  Skin: Positive for wound.    Objective: Vital Signs: There were no vitals taken for this visit.  Specialty Comments:  No specialty comments available.  PMFS History: Patient Active Problem List   Diagnosis Date Noted  . Sjogren's syndrome  (HCC) 02/11/2020  . Acute hematogenous osteomyelitis of right foot (HCC) 01/28/2020  . Cutaneous abscess of right foot   . Subacute osteomyelitis, right ankle and foot (HCC)   . Statin intolerance 08/16/2019  . PAF (paroxysmal atrial fibrillation) (HCC) 01/06/2019  . Gram-negative bacteremia 10/18/2018  . Streptococcal bacteremia 10/18/2018  . Ulcer of lower extremity, limited to breakdown of skin (HCC) 10/03/2018  . CAD S/P percutaneous coronary angioplasty 04/19/2018  . Essential hypertension   . Moderate persistent asthma 03/21/2018  . NAFLD (nonalcoholic fatty liver disease)   . Recurrent cellulitis of lower extremity 01/02/2018  . Cellulitis of lower leg 01/02/2018  . Chronic diarrhea 05/08/2017  . H/O Clostridium difficile infection 05/08/2017  . Rectal bleeding 05/08/2017  . Hyperbilirubinemia 04/29/2016  . Hyponatremia 04/29/2016  . Cellulitis of leg, right 03/09/2016  . Mild persistent asthma 02/15/2016  . Allergic rhinitis due to pollen 02/15/2016  . Anaphylactic reaction due to food 02/10/2016  . Diabetes mellitus type 2 in obese (HCC) 02/10/2016  . Coronary artery disease involving native coronary artery of native heart without angina pectoris 02/10/2016  . Gastroesophageal reflux disease without esophagitis 02/10/2016  . Atopic eczema 02/10/2016  . Cervical nerve root disorder 12/15/2015  . Lumbar radiculopathy 12/15/2015  . Daytime somnolence 11/26/2015  . Venous stasis dermatitis of both lower extremities 02/11/2015  . Morbid obesity (HCC) 06/19/2014  . B-complex deficiency 03/26/2014  . Benign essential HTN 03/26/2014  . Chronic pain associated with significant psychosocial dysfunction 03/26/2014  . Diaphragmatic hernia 03/26/2014  . Gastroesophageal reflux disease 03/26/2014  . Hammer toe 03/26/2014  . H/O neoplasm 03/26/2014  . 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 Diabetic polyneuropathy (HCC) 03/26/2014  . Deafness, sensorineural 03/26/2014  . Fibromyalgia  01/20/2014  . 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 Insulin dependent diabetes mellitus (HCC) 11/11/2012  . Hyperlipidemia LDL goal <70 11/11/2012  . Mixed connective tissue disease (HCC) 11/11/2012  . Hypothyroidism 11/11/2012  . Uncomplicated asthma 11/11/2012  . 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HCC) 02/20/2012  . Mixed collagen vascular disease 02/20/2012  . 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 ANA positive 07/17/2011   Past Medical History:  Diagnosis Date  . Asthma    "on daily RX and rescue inhaler" (04/18/2018)  . Chronic diastolic CHF (congestive heart failure) (HCC) 09/2015  . Chronic lower back pain   . Chronic neck pain   . Chronic pain syndrome    Fentanyl Patch  . Colon polyps   . Coronary artery disease    cath with normal LM, 30% LAD, 85% mid RCA and 95% distal RCA s/p PCI of the mid to distal RCA and now on DAPT with ASA and Ticagrelor.    . Eczema   . Excessive daytime sleepiness 11/26/2015  . Fibromyalgia   .  Gallstones   . GERD (gastroesophageal reflux disease)   . Heart murmur    "noted for the 1st time on 04/18/2018"  . History of blood transfusion 07/2010   "S/P oophorectomy"  . History of gout   . History of hiatal hernia 1980s   "gone now" (04/18/2018)  . Hyperlipidemia   . Hypertension    takes Metoprolol and Enalapril daily  . Hypothyroidism    takes Synthroid daily  . IBS (irritable bowel syndrome)   . Migraine    "nothing in the 2000s" (04/18/2018)  . Mixed connective tissue disease (HCC)   . NAFLD (nonalcoholic fatty liver disease)   . Pneumonia    "several times" (04/18/2018)  . Rheumatoid arthritis (HCC)    "hands, elbows, shoulders, probably knees" (04/18/2018)  . Scoliosis   . Spondylosis   . Type II diabetes mellitus (HCC)    takes Metformin and Hum R daily (04/18/2018)    Family History  Problem Relation Age of Onset  . CAD Mother   . Hypertension Mother   . Heart attack Mother   . CAD Father   . Heart  attack Father   . Allergic rhinitis Father   . Asthma Father   . Hypertension Brother   . Hypertension Brother   . Pancreatic cancer Paternal Aunt   . Breast cancer Paternal Aunt   . Lung cancer Paternal Aunt   . Asthma Son     Past Surgical History:  Procedure Laterality Date  . ABDOMINAL HYSTERECTOMY  06/2005   "w/right ovariy"  . AMPUTATION Right 01/28/2020    RIGHT FOURTH AND FIFTH RAY AMPUTATION  . AMPUTATION Right 01/28/2020   Procedure: RIGHT FOURTH AND FIFTH RAY AMPUTATION,;  Surgeon: [name], MD;  Location: [facility] OR;  Service: Orthopedics;  Laterality: Right;  . APPENDECTOMY    . BREAST BIOPSY Bilateral    9 total (04/18/2018)  . CORONARY ANGIOPLASTY WITH STENT PLACEMENT  10/01/2015   normal LM, 30% LAD, 85% mid RCA and 95% distal RCA s/p PCI of the mid to distal RCA and now on DAPT with ASA and Ticagrelor.    . CORONARY STENT INTERVENTION Right 04/18/2018   Procedure: CORONARY STENT INTERVENTION;  Surgeon: [name], MD;  Location: [facility];  Service: Cardiovascular;  Laterality: Right;  . DILATION AND CURETTAGE OF UTERUS    . FRACTURE SURGERY    . LAPAROSCOPIC CHOLECYSTECTOMY    . LEFT HEART CATH AND CORONARY ANGIOGRAPHY N/A 04/18/2018   Procedure: LEFT HEART CATH AND CORONARY ANGIOGRAPHY;  Surgeon: [name], MD;  Location: [facility];  Service: Cardiovascular;  Laterality: N/A;  . MUSCLE BIOPSY Left    "leg"  . OOPHORECTOMY  07/2010  . RADIOLOGY WITH ANESTHESIA N/A 05/25/2016   Procedure: RADIOLOGY WITH ANESTHESIA;  Surgeon: Medication Radiologist, MD;  Location: [facility] OR;  Service: Radiology;  Laterality: N/A;  . TONSILLECTOMY AND ADENOIDECTOMY    . WRIST FRACTURE SURGERY Left    "crushed it"   Social History   Occupational History  . Not on file  Tobacco Use  . Smoking status: Former Smoker    Packs/day: 1.00    Years: 19.00    Pack years: 19.00    Types: Cigarettes    Start date: 19    Quit date: 02/11/2004     Years since quitting: 16.4  . Smokeless tobacco: Never Used  Vaping Use  . Vaping Use: Never used  Substance and Sexual Activity  . Alcohol use:  Yes    Comment: RARE  . Drug use: Not Currently    Types: Marijuana    Comment: "only in my teens"  . Sexual activity: Yes    Birth control/protection: Surgical

## 2020-07-07 ENCOUNTER — Other Ambulatory Visit: Payer: Self-pay | Admitting: Family Medicine

## 2020-07-07 DIAGNOSIS — J45909 Unspecified asthma, uncomplicated: Secondary | ICD-10-CM

## 2020-07-08 ENCOUNTER — Telehealth: Payer: Self-pay | Admitting: Internal Medicine

## 2020-07-08 NOTE — Telephone Encounter (Signed)
Called and spoke with patient who states that she got a call from [place] saying that she needed to come to the office to sign paperwork for an appeal for her Nexium RX. Patient stated that she assumed her insurance covered her prescription since it was only $12. Advised patient to let us know if anything changes when she gets it filled next time. She expressed understanding. Nothing further needed at this time.

## 2020-07-22 ENCOUNTER — Ambulatory Visit: Payer: 59 | Admitting: Cardiology

## 2020-07-30 ENCOUNTER — Telehealth: Payer: Self-pay | Admitting: Orthopedic Surgery

## 2020-07-30 NOTE — Telephone Encounter (Signed)
[facility] called requesting verbal orders  Request approval to visit Monday nov 22 since apt was canceled today  recertification 11/23 1 week 1 2 week 3

## 2020-07-30 NOTE — Telephone Encounter (Signed)
Called and sw therapist to give verbal ok for orders as below. Will call with questions.

## 2020-08-01 ENCOUNTER — Other Ambulatory Visit: Payer: Self-pay | Admitting: Family Medicine

## 2020-08-01 DIAGNOSIS — J4540 Moderate persistent asthma, uncomplicated: Secondary | ICD-10-CM

## 2020-08-01 DIAGNOSIS — R058 Other specified cough: Secondary | ICD-10-CM

## 2020-08-01 DIAGNOSIS — J45909 Unspecified asthma, uncomplicated: Secondary | ICD-10-CM

## 2020-08-01 DIAGNOSIS — T464X5A Adverse effect of angiotensin-converting-enzyme inhibitors, initial encounter: Secondary | ICD-10-CM

## 2020-08-01 DIAGNOSIS — E1169 Type 2 diabetes mellitus with other specified complication: Secondary | ICD-10-CM

## 2020-08-01 DIAGNOSIS — E669 Obesity, unspecified: Secondary | ICD-10-CM

## 2020-08-11 ENCOUNTER — Other Ambulatory Visit: Payer: Self-pay | Admitting: Family Medicine

## 2020-08-11 DIAGNOSIS — J45909 Unspecified asthma, uncomplicated: Secondary | ICD-10-CM

## 2020-08-17 ENCOUNTER — Other Ambulatory Visit: Payer: Self-pay

## 2020-08-17 ENCOUNTER — Encounter: Payer: Self-pay | Admitting: Family

## 2020-08-17 ENCOUNTER — Ambulatory Visit (INDEPENDENT_AMBULATORY_CARE_PROVIDER_SITE_OTHER): Payer: 59 | Admitting: Family

## 2020-08-17 VITALS — Ht 62.0 in | Wt 327.0 lb

## 2020-08-17 DIAGNOSIS — Z89421 Acquired absence of other right toe(s): Secondary | ICD-10-CM | POA: Diagnosis not present

## 2020-08-17 DIAGNOSIS — T8130XA Disruption of wound, unspecified, initial encounter: Secondary | ICD-10-CM | POA: Diagnosis not present

## 2020-08-17 DIAGNOSIS — E1142 Type 2 diabetes mellitus with diabetic polyneuropathy: Secondary | ICD-10-CM | POA: Diagnosis not present

## 2020-08-17 NOTE — Progress Notes (Signed)
Office Visit Note   Patient: [name]           Date of Birth: Jun 30, 1961           MRN: [id number] Visit Date: 08/17/2020              Requested by: Sharlene Dory, DO [address] [place],  [place] [address] PCP: Sharlene Dory, DO  Chief Complaint  Patient presents with  . Right Foot - Follow-up    01/28/20 right 4th and 5th ray amputation       HPI: Patient is seen today in follow-up status post right fourth and fifth ray amputation. She had been slow to heal. She had been given nitroglycerin patches and was using Trental.   Has completed a course of PT.   Overall pleased with healing and control of edema. Complains of ankle and heel pain, worse with ambulation.  Assessment & Plan: Visit Diagnoses:  1. 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 Wound dehiscence   3. Diabetic polyneuropathy associated with type 2 diabetes mellitus (HCC)     Plan: Compression for edema, discussed different options. Is no longer in PT.  Follow-Up Instructions: Return if symptoms worsen or fail to improve.   Ortho Exam  Patient is alert, oriented, no adenopathy, well-dressed, normal affect, normal respiratory effort. Chronic venous stasis dermatitis on the right lower extremity is stable. 2+ edema. Mild erythema, no warmth. Incision is well healed, fully epithelialized. No erythema, warmth or concerning sign of foot.     Imaging: No results found. No images are attached to the encounter.  Labs: Lab Results  Component Value Date   HGBA1C 9.0 (H) 01/28/2020   HGBA1C 9.1 (H) 08/15/2019   HGBA1C 8.4 (H) 01/02/2018   ESRSEDRATE 37 (H) 01/02/2018   CRP 9.8 (H) 01/02/2018   REPTSTATUS 02/03/2020 FINAL 01/28/2020   GRAMSTAIN  01/28/2020    ABUNDANT WBC PRESENT,BOTH PMN AND MONONUCLEAR ABUNDANT GRAM POSITIVE COCCI ABUNDANT GRAM NEGATIVE RODS FEW GRAM POSITIVE RODS    CULT  01/28/2020    FEW PROTEUS  MIRABILIS FEW STAPHYLOCOCCUS AUREUS FEW ENTEROCOCCUS FAECALIS ABUNDANT BACTEROIDES SPECIES BETA LACTAMASE NEGATIVE Performed at [facility], [address] [place], [place] [address]    LABORGA PROTEUS MIRABILIS 01/28/2020   LABORGA STAPHYLOCOCCUS AUREUS 01/28/2020   LABORGA ENTEROCOCCUS FAECALIS 01/28/2020     Lab Results  Component Value Date   ALBUMIN 4.0 05/04/2020   ALBUMIN 3.2 (L) 01/21/2020   ALBUMIN 3.8 08/01/2019    Lab Results  Component Value Date   MG 2.2 10/20/2018   MG 2.2 10/19/2018   MG 1.2 (L) 10/17/2018   Lab Results  Component Value Date   VD25OH 69.04 07/25/2017    No results found for: PREALBUMIN CBC EXTENDED Latest Ref Rng & Units 05/04/2020 01/31/2020 01/21/2020  WBC 3.4 - 10.8 x10E3/uL 7.4 9.1 -  RBC 3.77 - 5.28 x10E6/uL 4.50 4.11 -  HGB 11.1 - 15.9 g/dL 10.5(L) 10.1(L) 12.6  HCT 34.0 - 46.6 % 35.4 34.6(L) 37.0  PLT 150 - 450 x10E3/uL 292 340 -  NEUTROABS 1.7 - 7.7 K/uL - - -  LYMPHSABS 0.7 - 4.0 K/uL - - -     Body mass index is 59.81 kg/m.  Orders:  No orders of the defined types were placed in this encounter.  No orders of the defined types were placed in this encounter.    Procedures:  No procedures performed  Clinical Data: No additional findings.  ROS:  All other systems negative, except as noted in the HPI. Review of Systems  Constitutional: Negative for chills and fever.  Cardiovascular: Positive for leg swelling.  Skin: Positive for wound.    Objective: Vital Signs: Ht 5\' 2"  (1.575 m)   Wt (!) 327 lb (148.3 kg)   BMI 59.81 kg/m   Specialty Comments:  No specialty comments available.  PMFS History: Patient Active Problem List   Diagnosis Date Noted  . Sjogren's syndrome (HCC) 02/11/2020  . Acute hematogenous osteomyelitis of right foot (HCC) 01/28/2020  . Cutaneous abscess of right foot   . Subacute osteomyelitis, right ankle and foot (HCC)   . Statin intolerance 08/16/2019  . PAF (paroxysmal  atrial fibrillation) (HCC) 01/06/2019  . Gram-negative bacteremia 10/18/2018  . Streptococcal bacteremia 10/18/2018  . Ulcer of lower extremity, limited to breakdown of skin (HCC) 10/03/2018  . CAD S/P percutaneous coronary angioplasty 04/19/2018  . Essential hypertension   . Moderate persistent asthma 03/21/2018  . NAFLD (nonalcoholic fatty liver disease)   . Recurrent cellulitis of lower extremity 01/02/2018  . Cellulitis of lower leg 01/02/2018  . Chronic diarrhea 05/08/2017  . H/O Clostridium difficile infection 05/08/2017  . Rectal bleeding 05/08/2017  . Hyperbilirubinemia 04/29/2016  . Hyponatremia 04/29/2016  . Cellulitis of leg, right 03/09/2016  . Mild persistent asthma 02/15/2016  . Allergic rhinitis due to pollen 02/15/2016  . Anaphylactic reaction due to food 02/10/2016  . Diabetes mellitus type 2 in obese (HCC) 02/10/2016  . Coronary artery disease involving native coronary artery of native heart without angina pectoris 02/10/2016  . Gastroesophageal reflux disease without esophagitis 02/10/2016  . Atopic eczema 02/10/2016  . Cervical nerve root disorder 12/15/2015  . Lumbar radiculopathy 12/15/2015  . Daytime somnolence 11/26/2015  . Venous stasis dermatitis of both lower extremities 02/11/2015  . Morbid obesity (HCC) 06/19/2014  . B-complex deficiency 03/26/2014  . Benign essential HTN 03/26/2014  . Chronic pain associated with significant psychosocial dysfunction 03/26/2014  . Diaphragmatic hernia 03/26/2014  . Gastroesophageal reflux disease 03/26/2014  . Hammer toe 03/26/2014  . H/O neoplasm 03/26/2014  . 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 Diabetic polyneuropathy (HCC) 03/26/2014  . Deafness, sensorineural 03/26/2014  . Fibromyalgia 01/20/2014  . 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 Insulin dependent diabetes mellitus (HCC) 11/11/2012  . Hyperlipidemia LDL goal <70 11/11/2012  . Mixed connective tissue disease (HCC) 11/11/2012  . Hypothyroidism  11/11/2012  . Uncomplicated asthma 11/11/2012  . 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HCC) 02/20/2012  . Mixed collagen vascular disease 02/20/2012  . 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 ANA positive 07/17/2011   Past Medical History:  Diagnosis Date  . Asthma    "on daily RX and rescue inhaler" (04/18/2018)  . Chronic diastolic CHF (congestive heart failure) (HCC) 09/2015  . Chronic lower back pain   . Chronic neck pain   . Chronic pain syndrome    Fentanyl Patch  . Colon polyps   . Coronary artery disease    cath with normal LM, 30% LAD, 85% mid RCA and 95% distal RCA s/p PCI of the mid to distal RCA and now on DAPT with ASA and Ticagrelor.    . Eczema   . Excessive daytime sleepiness 11/26/2015  . Fibromyalgia   . Gallstones   . GERD (gastroesophageal reflux disease)   . Heart murmur    "noted for the 1st time on 04/18/2018"  . History of blood transfusion  07/2010   "S/P oophorectomy"  . History of gout   . History of hiatal hernia 1980s   "gone now" (04/18/2018)  . Hyperlipidemia   . Hypertension    takes Metoprolol and Enalapril daily  . Hypothyroidism    takes Synthroid daily  . IBS (irritable bowel syndrome)   . Migraine    "nothing in the 2000s" (04/18/2018)  . Mixed connective tissue disease (HCC)   . NAFLD (nonalcoholic fatty liver disease)   . Pneumonia    "several times" (04/18/2018)  . Rheumatoid arthritis (HCC)    "hands, elbows, shoulders, probably knees" (04/18/2018)  . Scoliosis   . Spondylosis   . Type II diabetes mellitus (HCC)    takes Metformin and Hum R daily (04/18/2018)    Family History  Problem Relation Age of Onset  . CAD Mother   . Hypertension Mother   . Heart attack Mother   . CAD Father   . Heart attack Father   . Allergic rhinitis Father   . Asthma Father   . Hypertension Brother   . Hypertension Brother   . Pancreatic cancer Paternal Aunt   . Breast cancer Paternal Aunt   . Lung cancer Paternal Aunt   . Asthma Son       Past Surgical History:  Procedure Laterality Date  . ABDOMINAL HYSTERECTOMY  06/2005   "w/right ovariy"  . AMPUTATION Right 01/28/2020    RIGHT FOURTH AND FIFTH RAY AMPUTATION  . AMPUTATION Right 01/28/2020   Procedure: RIGHT FOURTH AND FIFTH RAY AMPUTATION,;  Surgeon: [name], MD;  Location: [facility] OR;  Service: Orthopedics;  Laterality: Right;  . APPENDECTOMY    . BREAST BIOPSY Bilateral    9 total (04/18/2018)  . CORONARY ANGIOPLASTY WITH STENT PLACEMENT  10/01/2015   normal LM, 30% LAD, 85% mid RCA and 95% distal RCA s/p PCI of the mid to distal RCA and now on DAPT with ASA and Ticagrelor.    . CORONARY STENT INTERVENTION Right 04/18/2018   Procedure: CORONARY STENT INTERVENTION;  Surgeon: [name], MD;  Location: [facility];  Service: Cardiovascular;  Laterality: Right;  . DILATION AND CURETTAGE OF UTERUS    . FRACTURE SURGERY    . LAPAROSCOPIC CHOLECYSTECTOMY    . LEFT HEART CATH AND CORONARY ANGIOGRAPHY N/A 04/18/2018   Procedure: LEFT HEART CATH AND CORONARY ANGIOGRAPHY;  Surgeon: [name], MD;  Location: [facility];  Service: Cardiovascular;  Laterality: N/A;  . MUSCLE BIOPSY Left    "leg"  . OOPHORECTOMY  07/2010  . RADIOLOGY WITH ANESTHESIA N/A 05/25/2016   Procedure: RADIOLOGY WITH ANESTHESIA;  Surgeon: Medication Radiologist, MD;  Location: [facility] OR;  Service: Radiology;  Laterality: N/A;  . TONSILLECTOMY AND ADENOIDECTOMY    . WRIST FRACTURE SURGERY Left    "crushed it"   Social History   Occupational History  . Not on file  Tobacco Use  . Smoking status: Former Smoker    Packs/day: 1.00    Years: 19.00    Pack years: 19.00    Types: Cigarettes    Start date: 75    Quit date: 02/11/2004    Years since quitting: 16.5  . Smokeless tobacco: Never Used  Vaping Use  . Vaping Use: Never used  Substance and Sexual Activity  . Alcohol use: Yes    Comment: RARE  . Drug use: Not Currently    Types: Marijuana    Comment:  "only in my teens"  . Sexual  activity: Yes    Birth control/protection: Surgical

## 2020-08-20 ENCOUNTER — Other Ambulatory Visit: Payer: Self-pay | Admitting: Family Medicine

## 2020-08-20 DIAGNOSIS — J45909 Unspecified asthma, uncomplicated: Secondary | ICD-10-CM

## 2020-08-23 ENCOUNTER — Other Ambulatory Visit: Payer: Self-pay | Admitting: Family Medicine

## 2020-08-23 DIAGNOSIS — J45909 Unspecified asthma, uncomplicated: Secondary | ICD-10-CM

## 2020-08-23 MED ORDER — ALBUTEROL SULFATE HFA 108 (90 BASE) MCG/ACT IN AERS: 2.0000 | INHALATION_SPRAY | RESPIRATORY_TRACT | 0 refills | Status: DC | PRN

## 2020-09-01 ENCOUNTER — Other Ambulatory Visit: Payer: Self-pay | Admitting: Family Medicine

## 2020-09-01 DIAGNOSIS — T464X5A Adverse effect of angiotensin-converting-enzyme inhibitors, initial encounter: Secondary | ICD-10-CM

## 2020-09-01 DIAGNOSIS — R058 Other specified cough: Secondary | ICD-10-CM

## 2020-09-07 ENCOUNTER — Other Ambulatory Visit: Payer: Self-pay | Admitting: Family Medicine

## 2020-09-07 ENCOUNTER — Emergency Department
Admission: EM | Admit: 2020-09-07 | Discharge: 2020-09-07 | Disposition: A | Discharge status: Home / Self Care | Payer: 59 | Source: Home / Self Care | Attending: Internal Medicine | Admitting: Internal Medicine

## 2020-09-07 ENCOUNTER — Other Ambulatory Visit: Payer: Self-pay

## 2020-09-07 DIAGNOSIS — J45909 Unspecified asthma, uncomplicated: Secondary | ICD-10-CM

## 2020-09-07 DIAGNOSIS — W19XXXA Unspecified fall, initial encounter: Secondary | ICD-10-CM

## 2020-09-07 DIAGNOSIS — Y92009 Unspecified place in unspecified non-institutional (private) residence as the place of occurrence of the external cause: Secondary | ICD-10-CM

## 2020-09-07 DIAGNOSIS — S0083XA Contusion of other part of head, initial encounter: Secondary | ICD-10-CM

## 2020-09-07 NOTE — Discharge Instructions (Signed)
Please take your medications for pain as directed If you experience any confusion, worsening headache, persistent nausea, blurry vision or persistent vomiting-please go to the emergency room to be reevaluated.

## 2020-09-07 NOTE — ED Triage Notes (Signed)
Pt c/o facial pain and headache after falling this am at 930am. Tripped over an electrical cord falling face first into the cabinet. Experiencing some dizziness and nausea. Took oxycodone and tylenol at 730am

## 2020-09-09 NOTE — ED Provider Notes (Addendum)
[facility]    CSN: [id number] Arrival date & time: 09/07/20  1034      History   Chief Complaint Chief Complaint  Patient presents with  . Fall  . Facial Pain  . Headache    HPI [name] is a 59 y.o. female comes to the urgent care with facial pain and headache (pain level 2-3 out of 10).  Patient fell after she tripped over a cord.  She fell at around 9:30 am. today.  She denies any loss of consciousness after the fall.  She had nosebleed after the fall.  No dizziness or double vision currently.  She continues to have a 2-3 out of 10 headache with no nausea or vomiting or confusion.  No blurry vision.  Patient has slight dizziness and denies any numbness or tingling in the extremities.   HPI  Past Medical History:  Diagnosis Date  . Asthma    "on daily RX and rescue inhaler" (04/18/2018)  . Chronic diastolic CHF (congestive heart failure) (HCC) 09/2015  . Chronic lower back pain   . Chronic neck pain   . Chronic pain syndrome    Fentanyl Patch  . Colon polyps   . Coronary artery disease    cath with normal LM, 30% LAD, 85% mid RCA and 95% distal RCA s/p PCI of the mid to distal RCA and now on DAPT with ASA and Ticagrelor.    . Eczema   . Excessive daytime sleepiness 11/26/2015  . Fibromyalgia   . Gallstones   . GERD (gastroesophageal reflux disease)   . Heart murmur    "noted for the 1st time on 04/18/2018"  . History of blood transfusion 07/2010   "S/P oophorectomy"  . History of gout   . History of hiatal hernia 1980s   "gone now" (04/18/2018)  . Hyperlipidemia   . Hypertension    takes Metoprolol and Enalapril daily  . Hypothyroidism    takes Synthroid daily  . IBS (irritable bowel syndrome)   . Migraine    "nothing in the 2000s" (04/18/2018)  . Mixed connective tissue disease (HCC)   . NAFLD (nonalcoholic fatty liver disease)   . Pneumonia    "several times" (04/18/2018)  . Rheumatoid arthritis (HCC)    "hands, elbows, shoulders, probably  knees" (04/18/2018)  . Scoliosis   . Spondylosis   . Type II diabetes mellitus (HCC)    takes Metformin and Hum R daily (04/18/2018)    Patient Active Problem List   Diagnosis Date Noted  . Sjogren's syndrome (HCC) 02/11/2020  . Acute hematogenous osteomyelitis of right foot (HCC) 01/28/2020  . Cutaneous abscess of right foot   . Subacute osteomyelitis, right ankle and foot (HCC)   . Statin intolerance 08/16/2019  . PAF (paroxysmal atrial fibrillation) (HCC) 01/06/2019  . Gram-negative bacteremia 10/18/2018  . Streptococcal bacteremia 10/18/2018  . Ulcer of lower extremity, limited to breakdown of skin (HCC) 10/03/2018  . CAD S/P percutaneous coronary angioplasty 04/19/2018  . Essential hypertension   . Moderate persistent asthma 03/21/2018  . NAFLD (nonalcoholic fatty liver disease)   . Recurrent cellulitis of lower extremity 01/02/2018  . Cellulitis of lower leg 01/02/2018  . Chronic diarrhea 05/08/2017  . H/O Clostridium difficile infection 05/08/2017  . Rectal bleeding 05/08/2017  . Hyperbilirubinemia 04/29/2016  . Hyponatremia 04/29/2016  . Cellulitis of leg, right 03/09/2016  . Mild persistent asthma 02/15/2016  . Allergic rhinitis due to pollen 02/15/2016  . Anaphylactic reaction due to food 02/10/2016  .  Diabetes mellitus type 2 in obese (HCC) 02/10/2016  . Coronary artery disease involving native coronary artery of native heart without angina pectoris 02/10/2016  . Gastroesophageal reflux disease without esophagitis 02/10/2016  . Atopic eczema 02/10/2016  . Cervical nerve root disorder 12/15/2015  . Lumbar radiculopathy 12/15/2015  . Daytime somnolence 11/26/2015  . Venous stasis dermatitis of both lower extremities 02/11/2015  . Morbid obesity (HCC) 06/19/2014  . B-complex deficiency 03/26/2014  . Benign essential HTN 03/26/2014  . Chronic pain associated with significant psychosocial dysfunction 03/26/2014  . Diaphragmatic hernia 03/26/2014  . Gastroesophageal  reflux disease 03/26/2014  . Hammer toe 03/26/2014  . H/O neoplasm 03/26/2014  . 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 Diabetic polyneuropathy (HCC) 03/26/2014  . Deafness, sensorineural 03/26/2014  . Fibromyalgia 01/20/2014  . 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 Insulin dependent diabetes mellitus (HCC) 11/11/2012  . Hyperlipidemia LDL goal <70 11/11/2012  . Mixed connective tissue disease (HCC) 11/11/2012  . Hypothyroidism 11/11/2012  . Uncomplicated asthma 11/11/2012  . 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HCC) 02/20/2012  . Mixed collagen vascular disease 02/20/2012  . 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 ANA positive 07/17/2011    Past Surgical History:  Procedure Laterality Date  . ABDOMINAL HYSTERECTOMY  06/2005   "w/right ovariy"  . AMPUTATION Right 01/28/2020    RIGHT FOURTH AND FIFTH RAY AMPUTATION  . AMPUTATION Right 01/28/2020   Procedure: RIGHT FOURTH AND FIFTH RAY AMPUTATION,;  Surgeon: [name], MD;  Location: [facility] OR;  Service: Orthopedics;  Laterality: Right;  . APPENDECTOMY    . BREAST BIOPSY Bilateral    9 total (04/18/2018)  . CORONARY ANGIOPLASTY WITH STENT PLACEMENT  10/01/2015   normal LM, 30% LAD, 85% mid RCA and 95% distal RCA s/p PCI of the mid to distal RCA and now on DAPT with ASA and Ticagrelor.    . CORONARY STENT INTERVENTION Right 04/18/2018   Procedure: CORONARY STENT INTERVENTION;  Surgeon: [name], MD;  Location: [facility];  Service: Cardiovascular;  Laterality: Right;  . DILATION AND CURETTAGE OF UTERUS    . FRACTURE SURGERY    . LAPAROSCOPIC CHOLECYSTECTOMY    . LEFT HEART CATH AND CORONARY ANGIOGRAPHY N/A 04/18/2018   Procedure: LEFT HEART CATH AND CORONARY ANGIOGRAPHY;  Surgeon: [name], MD;  Location: [facility];  Service: Cardiovascular;  Laterality: N/A;  . MUSCLE BIOPSY Left    "leg"  . OOPHORECTOMY  07/2010  . RADIOLOGY WITH ANESTHESIA N/A 05/25/2016   Procedure:  RADIOLOGY WITH ANESTHESIA;  Surgeon: Medication Radiologist, MD;  Location: [facility] OR;  Service: Radiology;  Laterality: N/A;  . TONSILLECTOMY AND ADENOIDECTOMY    . WRIST FRACTURE SURGERY Left    "crushed it"    OB History   No obstetric history on file.      Home Medications    Prior to Admission medications   Medication Sig Start Date End Date Taking? Authorizing Provider  acetaminophen (TYLENOL) 650 MG CR tablet Take 1,300 mg by mouth in the morning and at bedtime.    [provider]  albuterol (PROVENTIL) (2.5 MG/3ML) 0.083% nebulizer solution USE 1 VIAL IN NEBULIZER EVERY 4 HOURS AS NEEDED FOR WHEEZING OR  FOR  SHORTNESS  OF  BREATH 08/02/20   Wendling, Jilda Roche, DO  aspirin EC 81 MG tablet Take 81 mg by mouth daily.    [provider]  augmented betamethasone dipropionate (DIPROLENE-AF) 0.05 % cream APPLY 1 CREAM TOPICALLY ONCE DAILY AS  NEEDED FOR  ECZEMA 06/13/18   Sharlene Dory, DO  clotrimazole-betamethasone (LOTRISONE) cream Apply 1 application topically 2 (two) times daily as needed (eczema).    [provider]  diltiazem (DILT-XR) 240 MG 24 hr capsule Take 1 capsule (240 mg total) by mouth daily. 05/04/20   Rosalio Macadamia, NP  diphenhydrAMINE (BENADRYL) 25 MG tablet Take 25 mg by mouth daily as needed for allergies.    [provider]  EPINEPHrine (EPIPEN 2-PAK) 0.3 mg/0.3 mL IJ SOAJ injection USE AS DIRECTED FOR SEVERE ALLERGIC REACTION. 09/08/19   Sharlene Dory, DO  esomeprazole (NEXIUM) 40 MG capsule Take 1 capsule (40 mg total) by mouth 2 (two) times daily before a meal. 05/25/20   Charlott Holler, MD  fluticasone [facility]) 50 MCG/ACT nasal spray USE 2 SPRAY(S) IN EACH NOSTRIL ONCE DAILY AS NEEDED FOR  ALLERGIES  OR  RHINITIS 04/06/20   Sharlene Dory, DO  furosemide (LASIX) 20 MG tablet Take 0.5 tablets (10 mg total) by mouth daily. As needed for swelling 02/04/20 02/03/21  Persons, [place], PA  gabapentin  (NEURONTIN) 100 MG capsule Take 1 capsule (100 mg total) by mouth 3 (three) times daily. Pt takes 400mg  in the morning, 400mg  at lunchtime and 700mg  in the evening 02/04/20   Persons, [place], PA  glucose blood (BAYER CONTOUR TEST) test strip 1 each 4 (four) times daily.  03/28/17   [provider]  Guaifenesin (MUCINEX MAXIMUM STRENGTH) 1200 MG TB12 Take 1,200 mg by mouth 2 (two) times daily.    [provider]  guaiFENesin-dextromethorphan (ROBITUSSIN DM) 100-10 MG/5ML syrup Take 5 mLs by mouth every 4 (four) hours as needed for cough.    [provider]  hydroxychloroquine (PLAQUENIL) 200 MG tablet Take 1 tablet (200 mg total) by mouth 2 (two) times daily. 02/20/20   Sharlene Dory, DO  insulin regular human CONCENTRATED (HUMULIN R) 500 UNIT/ML injection Inject 0.08-0.3 mLs (40-150 Units total) into the skin 3 (three) times daily with meals. 04/27/20   Wendling, Jilda Roche, DO  INSULIN SYRINGE 1CC/29G (B-D INSULIN SYRINGE) 29G X 1/2" 1 ML MISC Use three times daily with insulin.  DX E11.9 06/11/20   Sharlene Dory, DO  isosorbide mononitrate (IMDUR) 30 MG 24 hr tablet Take 1 tablet (30 mg total) by mouth daily. 05/04/20   Rosalio Macadamia, NP  levocetirizine (XYZAL) 5 MG tablet TAKE 1 TABLET BY MOUTH ONCE DAILY IN THE EVENING 08/11/20   Wendling, Jilda Roche, DO  levothyroxine (SYNTHROID) 200 MCG tablet TAKE 1 TABLET BY MOUTH ONCE DAILY BEFORE BREAKFAST 08/11/20   Wendling, Jilda Roche, DO  magnesium oxide (MAG-OX) 400 MG tablet Take 400-1,600 mg by mouth 2 (two) times daily as needed (cramps).     [provider]  metFORMIN (GLUCOPHAGE) 500 MG tablet TAKE 1 TABLET BY MOUTH TWICE DAILY WITH A MEAL 08/02/20   Wendling, Jilda Roche, DO  mupirocin ointment (BACTROBAN) 2 % Apply 1 application topically 2 (two) times daily. Apply to the affected area 2 times a day 05/04/20   [name], MD  nitroGLYCERIN (NITROSTAT) 0.4 MG SL tablet DISSOLVE ONE  TABLET UNDER THE TONGUE EVERY 5 MINUTES AS NEEDED FOR CHEST PAIN.  DO NOT EXCEED A TOTAL OF 3 DOSES IN 15 MINUTES 08/01/19   Robbie Lis M, PA-C  ondansetron (ZOFRAN) 4 MG tablet Take 8 mg by mouth every 6 (six) hours as needed for nausea or vomiting.    [provider]  Oxycodone HCl 10 MG TABS Take 10 mg by mouth every 6 (six) hours as needed (pain).  12/17/19   [provider]  oxymetazoline (AFRIN) 0.05 % nasal spray Place 1 spray into both nostrils 2 (two) times daily as needed for congestion.    [provider]  pentoxifylline (TRENTAL) 400 MG CR tablet Take 1 tablet (400 mg total) by mouth 3 (three) times daily with meals. 05/04/20   [name], MD  Phenylephrine HCl (NEO-SYNEPHRINE NA) Place 1 spray into both nostrils daily as needed (congestion).    [provider]  pilocarpine (SALAGEN) 5 MG tablet Take 1 tablet (5 mg total) by mouth 2 (two) times daily. 02/16/20   Sharlene Dory, DO  Polyethyl Glycol-Propyl Glycol (SYSTANE ULTRA OP) Place 2 drops into both eyes daily as needed (dry eyes).    [provider]  potassium chloride (KLOR-CON) 10 MEQ tablet Take 1 tab for every 20 mg of Lasix taken. 09/03/19   Sharlene Dory, DO  pramipexole (MIRAPEX) 0.5 MG tablet Take 0.5 mg by mouth See admin instructions. Take 0.5 mg up to 6 times daily as needed for restless legs    [provider]  PROAIR HFA 108 (90 Base) MCG/ACT inhaler INHALE 2 PUFFS BY MOUTH EVERY 4 HOURS AS NEEDED FOR WHEEZING AND FOR SHORTNESS OF BREATH 09/08/20   Wendling, Jilda Roche, DO  promethazine (PHENERGAN) 25 MG tablet TAKE 1 TABLET BY MOUTH EVERY 8 HOURS AS NEEDED FOR NAUSEA AND FOR VOMITING 08/02/20   Wendling, Jilda Roche, DO  saccharomyces boulardii (FLORASTOR) 250 MG capsule Take 250 mg by mouth 3 (three) times daily.    [provider]  valsartan (DIOVAN) 160 MG tablet Take 1 tablet by mouth once daily 09/01/20   Wendling, Jilda Roche, DO  Vitamin D, Ergocalciferol, (DRISDOL) 1.25 MG (50000 UNIT) CAPS capsule Take 1 capsule (50,000 Units total) by mouth every 7 (seven) days. Take on Friday 12/24/19   Sharlene Dory, DO    Family History Family History  Problem Relation Age of Onset  . CAD Mother   . Hypertension Mother   . Heart attack Mother   . CAD Father   . Heart attack Father   . Allergic rhinitis Father   . Asthma Father   . Hypertension Brother   . Hypertension Brother   . Pancreatic cancer Paternal Aunt   . Breast cancer Paternal Aunt   . Lung cancer Paternal Aunt   . Asthma Son     Social History Social History   Tobacco Use  . Smoking status: Former Smoker    Packs/day: 1.00    Years: 19.00    Pack years: 19.00    Types: Cigarettes    Start date: 57    Quit date: 02/11/2004    Years since quitting: 16.5  . Smokeless tobacco: Never Used  Vaping Use  . Vaping Use: Never used  Substance Use Topics  . Alcohol use: Yes    Comment: RARE  . Drug use: Not Currently    Types: Marijuana    Comment: "only in my teens"     Allergies   Fish allergy, Fish oil, Omega-3 fatty acids, Crestor [rosuvastatin], Gabapentin, Lovastatin, Metronidazole, Red yeast rice [cholestin], Rotigotine, Atrovent hfa [ipratropium bromide hfa], Enalapril, Red yeast rice extract, Victoza [liraglutide], Suprep [na sulfate-k sulfate-mg sulf], and Tape   Review of Systems Review of Systems  Constitutional: Negative.   HENT: Negative.   Respiratory: Negative.   Cardiovascular: Negative.  Gastrointestinal: Negative.   Skin: Negative.   Neurological: Positive for dizziness and headaches. Negative for tremors, weakness and light-headedness.  Psychiatric/Behavioral: Negative for behavioral problems, confusion, decreased concentration and dysphoric mood. The patient is not nervous/anxious.      Physical Exam Triage Vital Signs ED Triage Vitals  Enc Vitals Group     BP 09/07/20 1121 (!) 180/82     Pulse  Rate 09/07/20 1121 83     Resp 09/07/20 1121 18     Temp 09/07/20 1121 97.7 F (36.5 C)     Temp Source 09/07/20 1121 Oral     SpO2 09/07/20 1121 97 %     Weight --      Height --      Head Circumference --      Peak Flow --      Pain Score 09/07/20 1122 5     Pain Loc --      Pain Edu? --      Excl. in GC? --    No data found.  Updated Vital Signs BP (!) 180/82 (BP Location: Left Wrist)   Pulse 83   Temp 97.7 F (36.5 C) (Oral)   Resp 18   SpO2 97%   Visual Acuity Right Eye Distance:   Left Eye Distance:   Bilateral Distance:    Right Eye Near:   Left Eye Near:    Bilateral Near:     Physical Exam Vitals and nursing note reviewed.  Constitutional:      General: She is not in acute distress.    Appearance: She is well-developed. She is not ill-appearing.  HENT:     Head: Atraumatic.     Comments: Mild swelling over the bridge of the nose.  Dried blood in both nostrils.  No signs of active bleeding. Cardiovascular:     Rate and Rhythm: Normal rate and regular rhythm.  Musculoskeletal:     Cervical back: Normal range of motion and neck supple.  Lymphadenopathy:     Cervical: No cervical adenopathy.  Neurological:     Mental Status: She is alert.     GCS: GCS eye subscore is 4. GCS verbal subscore is 5. GCS motor subscore is 6.     Cranial Nerves: No cranial nerve deficit, dysarthria or facial asymmetry.     Sensory: No sensory deficit.     Motor: No weakness.     Deep Tendon Reflexes: Reflexes normal.      UC Treatments / Results  Labs (all labs ordered are listed, but only abnormal results are displayed) Labs Reviewed - No data to display  EKG   Radiology No results found.  Procedures Procedures (including critical care time)  Medications Ordered in UC Medications - No data to display  Initial Impression / Assessment and Plan / UC Course  I have reviewed the triage vital signs and the nursing notes.  Pertinent labs & imaging results that  were available during my care of the patient were reviewed by me and considered in my medical decision making (see chart for details).     1.  Facial contusion following a mechanical fall: Continue your pain medications at home If you experience any confusion, persistent nausea/vomiting, blurry vision please go to the emergency room to be evaluated further Signs and symptoms of concussion was explained to the patient and her family member accompanying her during this visit. No indication for CT scan of the head. Strict return to ED precautions were given. Final Clinical Impressions(s) /  UC Diagnoses   Final diagnoses:  Contusion of face, initial encounter  Fall in home, initial encounter     Discharge Instructions     Please take your medications for pain as directed If you experience any confusion, worsening headache, persistent nausea, blurry vision or persistent vomiting-please go to the emergency room to be reevaluated.   ED Prescriptions    None     PDMP not reviewed this encounter.   Merrilee Jansky, MD 09/09/20 1244    Merrilee Jansky, MD 09/09/20 1245

## 2020-09-28 ENCOUNTER — Other Ambulatory Visit: Payer: Self-pay | Admitting: Family Medicine

## 2020-09-28 DIAGNOSIS — T464X5A Adverse effect of angiotensin-converting-enzyme inhibitors, initial encounter: Secondary | ICD-10-CM

## 2020-09-28 DIAGNOSIS — R058 Other specified cough: Secondary | ICD-10-CM

## 2020-10-24 ENCOUNTER — Other Ambulatory Visit: Payer: Self-pay | Admitting: Cardiology

## 2020-10-24 ENCOUNTER — Other Ambulatory Visit: Payer: Self-pay | Admitting: Family Medicine

## 2020-10-24 DIAGNOSIS — E669 Obesity, unspecified: Secondary | ICD-10-CM

## 2020-10-24 DIAGNOSIS — T464X5A Adverse effect of angiotensin-converting-enzyme inhibitors, initial encounter: Secondary | ICD-10-CM

## 2020-10-24 DIAGNOSIS — R058 Other specified cough: Secondary | ICD-10-CM

## 2020-10-24 DIAGNOSIS — E1169 Type 2 diabetes mellitus with other specified complication: Secondary | ICD-10-CM

## 2020-10-27 ENCOUNTER — Other Ambulatory Visit: Payer: Self-pay

## 2020-10-27 ENCOUNTER — Ambulatory Visit (INDEPENDENT_AMBULATORY_CARE_PROVIDER_SITE_OTHER): Payer: 59 | Admitting: Family Medicine

## 2020-10-27 ENCOUNTER — Encounter: Payer: Self-pay | Admitting: Family Medicine

## 2020-10-27 VITALS — BP 128/68 | HR 97 | Temp 98.3°F | Ht 62.0 in | Wt 331.5 lb

## 2020-10-27 DIAGNOSIS — E669 Obesity, unspecified: Secondary | ICD-10-CM

## 2020-10-27 DIAGNOSIS — Z789 Other specified health status: Secondary | ICD-10-CM

## 2020-10-27 DIAGNOSIS — E1169 Type 2 diabetes mellitus with other specified complication: Secondary | ICD-10-CM | POA: Diagnosis not present

## 2020-10-27 DIAGNOSIS — I10 Essential (primary) hypertension: Secondary | ICD-10-CM

## 2020-10-27 DIAGNOSIS — Z1231 Encounter for screening mammogram for malignant neoplasm of breast: Secondary | ICD-10-CM | POA: Diagnosis not present

## 2020-10-27 LAB — LDL CHOLESTEROL, DIRECT: Direct LDL: 121 mg/dL

## 2020-10-27 LAB — LIPID PANEL
Cholesterol: 194 mg/dL (ref 0–200)
HDL: 46.6 mg/dL (ref >39.00)
NonHDL: 147.05
Total CHOL/HDL Ratio: 4
Triglycerides: 223 mg/dL — ABNORMAL HIGH (ref 0.0–149.0)
VLDL: 44.6 mg/dL — ABNORMAL HIGH (ref 0.0–40.0)

## 2020-10-27 LAB — COMPREHENSIVE METABOLIC PANEL
ALT: 22 U/L (ref 0–35)
AST: 22 U/L (ref 0–37)
Albumin: 4.1 g/dL (ref 3.5–5.2)
Alkaline Phosphatase: 80 U/L (ref 39–117)
BUN: 17 mg/dL (ref 6–23)
CO2: 28 mEq/L (ref 19–32)
Calcium: 10 mg/dL (ref 8.4–10.5)
Chloride: 98 mEq/L (ref 96–112)
Creatinine, Ser: 0.88 mg/dL (ref 0.40–1.20)
GFR: 71.98 mL/min (ref >60.00)
Glucose, Bld: 167 mg/dL — ABNORMAL HIGH (ref 70–99)
Potassium: 4.5 mEq/L (ref 3.5–5.1)
Sodium: 136 mEq/L (ref 135–145)
Total Bilirubin: 0.3 mg/dL (ref 0.2–1.2)
Total Protein: 7.3 g/dL (ref 6.0–8.3)

## 2020-10-27 LAB — HEMOGLOBIN A1C: Hgb A1c MFr Bld: 7.1 % — ABNORMAL HIGH (ref 4.6–6.5)

## 2020-10-27 MED ORDER — CLOTRIMAZOLE-BETAMETHASONE 1-0.05 % EX CREA: 1.0000 "application " | TOPICAL_CREAM | Freq: Two times a day (BID) | CUTANEOUS | 0 refills | Status: DC

## 2020-10-27 NOTE — Patient Instructions (Signed)
Strong work with your sugars.  Keep the diet clean and stay active.  Give [place] 2-3 business days to get the results of your labs back.   Let us know if you need anything.

## 2020-10-27 NOTE — Progress Notes (Signed)
Subjective:   Chief Complaint  Patient presents with  . Follow-up    [name] is a 60 y.o. female here for follow-up of diabetes.   [name]'s self monitored glucose range is mid-low 100's.  Patient denies hypoglycemic reactions. She checks her glucose levels 3 time(s) per day. Patient does require insulin- Humalin 10-15 u in AM, 20 u lunch, rare use in evenings. .   Medications include: metformin 500 mg bid.  Diet is healthy overall.  Exercise: not much exercise right now No new CP or SOB.   Hypertension Patient presents for hypertension follow up. She does monitor home blood pressures. Blood pressures ranging on average from 120's/70's. She is not currently on any BP medication.  Diet/exercise as above.   Past Medical History:  Diagnosis Date  . Asthma    "on daily RX and rescue inhaler" (04/18/2018)  . Chronic diastolic CHF (congestive heart failure) (HCC) 09/2015  . Chronic lower back pain   . Chronic neck pain   . Chronic pain syndrome    Fentanyl Patch  . Colon polyps   . Coronary artery disease    cath with normal LM, 30% LAD, 85% mid RCA and 95% distal RCA s/p PCI of the mid to distal RCA and now on DAPT with ASA and Ticagrelor.    . Eczema   . Excessive daytime sleepiness 11/26/2015  . Fibromyalgia   . Gallstones   . GERD (gastroesophageal reflux disease)   . Heart murmur    "noted for the 1st time on 04/18/2018"  . History of blood transfusion 07/2010   "S/P oophorectomy"  . History of gout   . History of hiatal hernia 1980s   "gone now" (04/18/2018)  . Hyperlipidemia   . Hypertension    takes Metoprolol and Enalapril daily  . Hypothyroidism    takes Synthroid daily  . IBS (irritable bowel syndrome)   . Migraine    "nothing in the 2000s" (04/18/2018)  . Mixed connective tissue disease (HCC)   . NAFLD (nonalcoholic fatty liver disease)   . Pneumonia    "several times" (04/18/2018)  . Rheumatoid arthritis (HCC)    "hands, elbows, shoulders, probably knees"  (04/18/2018)  . Scoliosis   . Spondylosis   . Type II diabetes mellitus (HCC)    takes Metformin and Hum R daily (04/18/2018)     Related testing: Retinal exam: Done Pneumovax: done  Objective:  BP 128/68 (BP Location: Left Arm, Patient Position: Sitting, Cuff Size: Large)   Pulse 97   Temp 98.3 F (36.8 C) (Oral)   Ht 5\' 2"  (1.575 m)   Wt (!) 331 lb 8 oz (150.4 kg)   SpO2 95%   BMI 60.63 kg/m  General:  Well developed, well nourished, in no apparent distress Lungs:  CTAB, no access msc use Cardio:  RRR Psych: Age appropriate judgment and insight  Assessment:   Diabetes mellitus type 2 in obese (HCC) - Plan: Hemoglobin A1c, Microalbumin / creatinine urine ratio, Lipid panel, Comprehensive metabolic panel  Essential hypertension  Statin intolerance  Encounter for screening mammogram for malignant neoplasm of breast - Plan: MM DIGITAL SCREENING BILATERAL  Morbid obesity (HCC)   Plan:   1.  Continue Metformin 500 mg twice daily.  Continue Humulin 3 times daily.  Her home readings are excellent.  Counseled on diet and exercise.  Her goal A1c is less than 8.  if A1c not at goal. 2.  Continue to monitor intermittently.  Angiotensin receptor blocker if  she does need an agent. F/u in 3-6 mo. The patient voiced understanding and agreement to the plan.  Jilda Roche [place], DO 10/27/20 9:46 AM

## 2020-10-28 NOTE — Addendum Note (Signed)
Addended by: Mervin Kung A on: 10/28/2020 02:36 PM   Modules accepted: Orders

## 2020-10-30 ENCOUNTER — Ambulatory Visit (HOSPITAL_BASED_OUTPATIENT_CLINIC_OR_DEPARTMENT_OTHER): Payer: 59

## 2020-11-02 MED ORDER — AMOXICILLIN-POT CLAVULANATE 875-125 MG PO TABS: 1.0000 | ORAL_TABLET | Freq: Two times a day (BID) | ORAL | 0 refills | Status: DC

## 2020-11-02 MED ORDER — FLUCONAZOLE 150 MG PO TABS: ORAL_TABLET | ORAL | 0 refills | Status: DC

## 2020-11-02 NOTE — Addendum Note (Signed)
Addended by: Radene Gunning on: 11/02/2020 05:01 PM   Modules accepted: Orders

## 2020-11-18 ENCOUNTER — Other Ambulatory Visit: Payer: Self-pay | Admitting: Family Medicine

## 2020-11-18 DIAGNOSIS — E559 Vitamin D deficiency, unspecified: Secondary | ICD-10-CM

## 2020-11-18 DIAGNOSIS — E1169 Type 2 diabetes mellitus with other specified complication: Secondary | ICD-10-CM

## 2020-11-18 DIAGNOSIS — J45909 Unspecified asthma, uncomplicated: Secondary | ICD-10-CM

## 2020-11-20 ENCOUNTER — Inpatient Hospital Stay (HOSPITAL_BASED_OUTPATIENT_CLINIC_OR_DEPARTMENT_OTHER): Admission: RE | Admit: 2020-11-20 | Payer: 59 | Source: Ambulatory Visit

## 2020-11-25 ENCOUNTER — Other Ambulatory Visit: Payer: Self-pay | Admitting: Family Medicine

## 2020-11-25 DIAGNOSIS — J45909 Unspecified asthma, uncomplicated: Secondary | ICD-10-CM

## 2020-11-27 ENCOUNTER — Other Ambulatory Visit: Payer: Self-pay | Admitting: Family Medicine

## 2020-11-27 DIAGNOSIS — T464X5A Adverse effect of angiotensin-converting-enzyme inhibitors, initial encounter: Secondary | ICD-10-CM

## 2020-11-27 DIAGNOSIS — R058 Other specified cough: Secondary | ICD-10-CM

## 2020-12-22 ENCOUNTER — Other Ambulatory Visit: Payer: Self-pay | Admitting: Family Medicine

## 2020-12-22 DIAGNOSIS — R058 Other specified cough: Secondary | ICD-10-CM

## 2020-12-22 DIAGNOSIS — J45909 Unspecified asthma, uncomplicated: Secondary | ICD-10-CM

## 2020-12-22 DIAGNOSIS — T464X5A Adverse effect of angiotensin-converting-enzyme inhibitors, initial encounter: Secondary | ICD-10-CM

## 2021-01-17 ENCOUNTER — Encounter: Payer: Self-pay | Admitting: Family Medicine

## 2021-01-17 ENCOUNTER — Telehealth (INDEPENDENT_AMBULATORY_CARE_PROVIDER_SITE_OTHER): Payer: 59 | Admitting: Family Medicine

## 2021-01-17 ENCOUNTER — Other Ambulatory Visit: Payer: Self-pay

## 2021-01-17 DIAGNOSIS — J0100 Acute maxillary sinusitis, unspecified: Secondary | ICD-10-CM | POA: Diagnosis not present

## 2021-01-17 DIAGNOSIS — J4531 Mild persistent asthma with (acute) exacerbation: Secondary | ICD-10-CM

## 2021-01-17 MED ORDER — PREDNISONE 20 MG PO TABS: 40.0000 mg | ORAL_TABLET | Freq: Every day | ORAL | 0 refills | Status: AC

## 2021-01-17 MED ORDER — FLUCONAZOLE 150 MG PO TABS: ORAL_TABLET | ORAL | 0 refills | Status: DC

## 2021-01-17 MED ORDER — DOXYCYCLINE HYCLATE 100 MG PO TABS: 100.0000 mg | ORAL_TABLET | Freq: Two times a day (BID) | ORAL | 0 refills | Status: AC

## 2021-01-17 NOTE — Progress Notes (Signed)
Chief Complaint  Patient presents with  . Asthma  . Sinus Problem  . Allergies    [employer] Student here for URI complaints. Due to COVID-19 pandemic, we are interacting via web portal for an electronic face-to-face visit. I verified patient's ID using 2 identifiers. Patient agreed to proceed with visit via this method. Patient is at home, I am at office. Patient and I are present for visit.    Duration: 2 weeks  Associated symptoms: Fever (99.6-101 F), sinus congestion, sinus pain, rhinorrhea, itchy watery eyes, ear fullness, ear pain, sore throat, wheezing, shortness of breath and coughing Denies: ear drainage, myalgia and N/V Treatment to date: breathing tx, Robitussin, Afrin, INCS, saline Sick contacts: Yes- spouse  Past Medical History:  Diagnosis Date  . Asthma    "on daily RX and rescue inhaler" (04/18/2018)  . Chronic diastolic CHF (congestive heart failure) (HCC) 09/2015  . Chronic lower back pain   . Chronic neck pain   . Chronic pain syndrome    Fentanyl Patch  . Colon polyps   . Coronary artery disease    cath with normal LM, 30% LAD, 85% mid RCA and 95% distal RCA s/p PCI of the mid to distal RCA and now on DAPT with ASA and Ticagrelor.    . Eczema   . Excessive daytime sleepiness 11/26/2015  . Fibromyalgia   . Gallstones   . GERD (gastroesophageal reflux disease)   . Heart murmur    "noted for the 1st time on 04/18/2018"  . History of blood transfusion 07/2010   "S/P oophorectomy"  . History of gout   . History of hiatal hernia 1980s   "gone now" (04/18/2018)  . Hyperlipidemia   . Hypertension    takes Metoprolol and Enalapril daily  . Hypothyroidism    takes Synthroid daily  . IBS (irritable bowel syndrome)   . Migraine    "nothing in the 2000s" (04/18/2018)  . Mixed connective tissue disease (HCC)   . NAFLD (nonalcoholic fatty liver disease)   . Pneumonia    "several times" (04/18/2018)  . Rheumatoid arthritis (HCC)    "hands, elbows, shoulders, probably knees"  (04/18/2018)  . Scoliosis   . Spondylosis   . Type II diabetes mellitus (HCC)    takes Metformin and Hum R daily (04/18/2018)   Objective No conversational dyspnea Age appropriate judgment and insight Nml affect and mood  Mild persistent asthma with exacerbation - Plan: predniSONE (DELTASONE) 20 MG tablet  Acute maxillary sinusitis, recurrence not specified - Plan: doxycycline (VIBRA-TABS) 100 MG tablet, fluconazole (DIFLUCAN) 150 MG tablet  5 d pred burst 40 mg/d for asthma.  2 weeks and worsening + fever, will rx doxy, Diflucan should she develop yeast infection.  Continue to push fluids, practice good hand hygiene, cover mouth when coughing. F/u prn. If starting to experience irreplaceable fluid loss, shaking, or shortness of breath, seek immediate care. Pt voiced understanding and agreement to the plan.  Jilda Roche [place], DO 01/17/21 10:29 AM

## 2021-01-23 ENCOUNTER — Other Ambulatory Visit: Payer: Self-pay | Admitting: Family Medicine

## 2021-01-23 DIAGNOSIS — T464X5A Adverse effect of angiotensin-converting-enzyme inhibitors, initial encounter: Secondary | ICD-10-CM

## 2021-01-23 DIAGNOSIS — R058 Other specified cough: Secondary | ICD-10-CM

## 2021-01-23 DIAGNOSIS — J45909 Unspecified asthma, uncomplicated: Secondary | ICD-10-CM

## 2021-01-23 DIAGNOSIS — E559 Vitamin D deficiency, unspecified: Secondary | ICD-10-CM

## 2021-01-24 ENCOUNTER — Other Ambulatory Visit: Payer: Self-pay | Admitting: Family Medicine

## 2021-01-31 ENCOUNTER — Telehealth: Payer: Self-pay | Admitting: Cardiology

## 2021-01-31 NOTE — Telephone Encounter (Signed)
Spoke with the patient who states that she has been having more frequent palpitations over the past two weeks. She states that she can feel fluttering. She denies any chest pain. She states that she does have SOB but no more than her baseline. She has had a couple episodes of dizziness but has not felt like she is going to pass out. She reports some lightheadedness with episodes. She also reports more fatigue recently. She states her HR has been 70-80s. She has not taken her HR during episodes of palpitations. She continues to take diltiazem 240 daily. She is scheduled to see Dr. Mayford Knife on Wednesday 5/25. She will let us know if symptoms worsen in the meantime.

## 2021-01-31 NOTE — Telephone Encounter (Signed)
Patient c/o Palpitations:  High priority if patient c/o lightheadedness, shortness of breath, or chest pain  1) How long have you had palpitations/irregular HR/ Afib? Are you having the symptoms now? palpitations and fluttering- not at this time, but earlier this morning  2) Are you currently experiencing lightheadedness, SOB or CP?  lightheaded and tiredness, but not att this time  3) Do you have a history of afib (atrial fibrillation) or irregular heart rhythm? no  4) Have you checked your BP or HR? (document readings if available): goodlately  5) Are you experiencing any other symptoms? No, pt wanted to be seen, I made an apot for Wednesday(02-02-21)- please call to evaluate

## 2021-02-02 ENCOUNTER — Other Ambulatory Visit: Payer: Self-pay

## 2021-02-02 ENCOUNTER — Encounter: Payer: Self-pay | Admitting: *Deleted

## 2021-02-02 ENCOUNTER — Ambulatory Visit (INDEPENDENT_AMBULATORY_CARE_PROVIDER_SITE_OTHER): Payer: 59 | Admitting: Cardiology

## 2021-02-02 ENCOUNTER — Encounter: Payer: Self-pay | Admitting: Cardiology

## 2021-02-02 VITALS — BP 126/58 | HR 90 | Ht 62.0 in | Wt 340.8 lb

## 2021-02-02 DIAGNOSIS — I480 Paroxysmal atrial fibrillation: Secondary | ICD-10-CM

## 2021-02-02 DIAGNOSIS — E785 Hyperlipidemia, unspecified: Secondary | ICD-10-CM

## 2021-02-02 DIAGNOSIS — T464X5D Adverse effect of angiotensin-converting-enzyme inhibitors, subsequent encounter: Secondary | ICD-10-CM

## 2021-02-02 DIAGNOSIS — R0600 Dyspnea, unspecified: Secondary | ICD-10-CM

## 2021-02-02 DIAGNOSIS — I10 Essential (primary) hypertension: Secondary | ICD-10-CM

## 2021-02-02 DIAGNOSIS — M351 Other overlap syndromes: Secondary | ICD-10-CM

## 2021-02-02 DIAGNOSIS — R058 Other specified cough: Secondary | ICD-10-CM

## 2021-02-02 DIAGNOSIS — Z9861 Coronary angioplasty status: Secondary | ICD-10-CM | POA: Diagnosis not present

## 2021-02-02 DIAGNOSIS — I2510 Atherosclerotic heart disease of native coronary artery without angina pectoris: Secondary | ICD-10-CM

## 2021-02-02 DIAGNOSIS — T464X5A Adverse effect of angiotensin-converting-enzyme inhibitors, initial encounter: Secondary | ICD-10-CM

## 2021-02-02 DIAGNOSIS — R0609 Other forms of dyspnea: Secondary | ICD-10-CM

## 2021-02-02 MED ORDER — ISOSORBIDE MONONITRATE ER 30 MG PO TB24: 30.0000 mg | ORAL_TABLET | Freq: Every day | ORAL | 3 refills | Status: DC

## 2021-02-02 MED ORDER — VALSARTAN 160 MG PO TABS: 160.0000 mg | ORAL_TABLET | Freq: Every day | ORAL | 3 refills | Status: DC

## 2021-02-02 MED ORDER — DILTIAZEM HCL ER 240 MG PO CP24
240.0000 mg | ORAL_CAPSULE | Freq: Every day | ORAL | 3 refills | Status: DC
Start: 2021-02-02 — End: 2021-06-24

## 2021-02-02 NOTE — Addendum Note (Signed)
Addended by: Quintella Reichert on: 02/02/2021 08:51 AM   Modules accepted: Orders

## 2021-02-02 NOTE — Progress Notes (Signed)
Patient ID: [name], female   DOB: [birth year], 60 y.o.   MRN: [id number] Patient enrolled for [employer] to ship a 30 day cardiac event monitor to her home.

## 2021-02-02 NOTE — Patient Instructions (Signed)
Medication Instructions:  Your physician recommends that you continue on your current medications as directed. Please refer to the Current Medication list given to you today.  *If you need a refill on your cardiac medications before your next appointment, please call your pharmacy*  Testing/Procedures: Your physician has requested that you have a lexiscan myoview. For further information please visit [email or web address] Please follow instruction sheet, as given.  Your physician has requested that you have an echocardiogram. Echocardiography is a painless test that uses sound waves to create images of your heart. It provides your doctor with information about the size and shape of your heart and how well your heart's chambers and valves are working. This procedure takes approximately one hour. There are no restrictions for this procedure.  Your physician has recommended that you wear an event monitor. Event monitors are medical devices that record the heart's electrical activity. Doctors most often [place] these monitors to diagnose arrhythmias. Arrhythmias are problems with the speed or rhythm of the heartbeat. The monitor is a small, portable device. You can wear one while you do your normal daily activities. This is usually used to diagnose what is causing palpitations/syncope (passing out).  Follow-Up: At [facility], you and your health needs are our priority.  As part of our continuing mission to provide you with exceptional heart care, we have created designated Provider Care Teams.  These Care Teams include your primary Cardiologist (physician) and Advanced Practice Providers (APPs -  Physician Assistants and Nurse Practitioners) who all work together to provide you with the care you need, when you need it.  Your next appointment:   1 year(s)  The format for your next appointment:   In Person  Provider:   You may see Armanda Magic, MD or one of the following Advanced Practice Providers on  your designated Care Team:    Ronie Spies, PA-C  Jacolyn Reedy, PA-C   Other Instructions You have been referred to see our Pharmacist in the Lipid Clinic    [employer] Cardiac Event Monitor Instructions Your physician has requested you wear your cardiac event monitor for __30_ days, (1-30). [employer] may call or text to confirm a shipping address. The monitor will be sent to a land address via UPS. [employer] will not ship a monitor to a PO BOX. It typically takes 3-5 days to receive your monitor after it has been enrolled. [employer] will assist with USPS tracking if your package is delayed. The telephone number for [employer] is [phone]. Once you have received your monitor, please review the enclosed instructions. Instruction tutorials can also be viewed under help and settings on the enclosed cell phone. Your monitor has already been registered assigning a specific monitor serial # to you.  Applying the monitor Remove cell phone from case and turn it on. The cell phone works as IT consultant and needs to be within [employer] of you at all times. The cell phone will need to be charged on a daily basis. We recommend you plug the cell phone into the enclosed charger at your bedside table every night.  Monitor batteries: You will receive two monitor batteries labelled #1 and #2. These are your recorders. Plug battery #2 onto the second connection on the enclosed charger. Keep one battery on the charger at all times. This will keep the monitor battery deactivated. It will also keep it fully charged for when you need to switch your monitor batteries. A small light will be blinking on the battery emblem when it  is charging. The light on the battery emblem will remain on when the battery is fully charged.  Open package of a Monitor strip. Insert battery #1 into black hood on strip and gently squeeze monitor battery onto connection as indicated in instruction booklet. Set aside  while preparing skin.  Choose location for your strip, vertical or horizontal, as indicated in the instruction booklet. Shave to remove all hair from location. There cannot be any lotions, oils, powders, or colognes on skin where monitor is to be applied. Wipe skin clean with enclosed Saline wipe. Dry skin completely.  Peel paper labeled #1 off the back of the Monitor strip exposing the adhesive. Place the monitor on the chest in the vertical or horizontal position shown in the instruction booklet. One arrow on the monitor strip must be pointing upward. Carefully remove paper labeled #2, attaching remainder of strip to your skin. Try not to create any folds or wrinkles in the strip as you apply it.  Firmly press and release the circle in the center of the monitor battery. You will hear a small beep. This is turning the monitor battery on. The heart emblem on the monitor battery will light up every 5 seconds if the monitor battery in turned on and connected to the patient securely. Do not push and hold the circle down as this turns the monitor battery off. The cell phone will locate the monitor battery. A screen will appear on the cell phone checking the connection of your monitor strip. This may read poor connection initially but change to good connection within the next minute. Once your monitor accepts the connection you will hear a series of 3 beeps followed by a climbing crescendo of beeps. A screen will appear on the cell phone showing the two monitor strip placement options. Touch the picture that demonstrates where you applied the monitor strip.  Your monitor strip and battery are waterproof. You are able to shower, bathe, or swim with the monitor on. They just ask you do not submerge deeper than 3 feet underwater. We recommend removing the monitor if you are swimming in a lake, river, or ocean.  Your monitor battery will need to be switched to a fully charged monitor battery  approximately once a week. The cell phone will alert you of an action which needs to be made.  On the cell phone, tap for details to reveal connection status, monitor battery status, and cell phone battery status. The green dots indicates your monitor is in good status. A red dot indicates there is something that needs your attention.  To record a symptom, click the circle on the monitor battery. In 30-60 seconds a list of symptoms will appear on the cell phone. Select your symptom and tap save. Your monitor will record a sustained or significant arrhythmia regardless of you clicking the button. Some patients do not feel the heart rhythm irregularities. [employer] will notify us of any serious or critical events.  Refer to instruction booklet for instructions on switching batteries, changing strips, the Do not disturb or Pause features, or any additional questions.  Call [employer] at [phone], to confirm your monitor is transmitting and record your baseline. They will answer any questions you may have regarding the monitor instructions at that time.  Returning the monitor to [employer] Place all equipment back into blue box. Peel off strip of paper to expose adhesive and close box securely. There is a prepaid UPS shipping label on this box. Drop in a UPS  drop box, or at a UPS facility like [employer]. You may also contact [employer] to arrange UPS to pick up monitor package at your home.

## 2021-02-02 NOTE — Addendum Note (Signed)
Addended by: Theresia Majors on: 02/02/2021 08:50 AM   Modules accepted: Orders

## 2021-02-02 NOTE — Progress Notes (Signed)
Date:  02/02/2021   ID:  [name], DOB 24-Jul-1961, MRN [id number]  PCP:  Sharlene Dory, DO  Cardiologist: Armanda Magic, MD Electrophysiologist:  None   Chief Complaint:  CAD  History of Present Illness:    [name] is a 60 y.o. female with a hx ofASCAD withprior cath showingnormal LM,30% LAD,85% mid RCA and 95% distal RCA s/p PCI of the mid to distal RCA. She also has a history of hypercholesterolemiaw/ statin intolerance, hypertension, chronic diastolic CHF with last echo in July 2018 showed normal LV function, type 2 diabetes mellitus, fibromyalgia with chronic pain, chronic venous stasis dermatitis, morbid obesity and rheumatoid arthritis with mixed connective tissue disease.  Repeat cath for CP and heartburn 04/19/18 showed severe stenosis of the ostial RCA, 99% stenosed, treated with successful PCI + DES placement. The previously placed stents in the mid and distal RCA were patent withonlyminimal stenosis. The LAD had only mild, 20%, disease. She was placed on DAPT w/ ASA and Plavix. Also continued on a BB and ARB.  She was referred to lipid clinic for consideration of PCSK9 inhibitor but never followed through.    She also has a hx  of A. fib with RVR in the setting of sepsis and bacteremia and converted to NSR and has not had any further A. fib.  She was on Lopressor but no anticoagulation.  Her CHADS2VASC score was 5 but due to the very brief nature of her A. fib in the setting of acute infection and respiratory failure it was recommended to hold off on anticoagulation unless she had further episodes of atrial fibrillation.  Her BB was changed to Cardizem due to wheezing and asthma .  Since I saw her last she had a [employer]Met of her right foot due to nonhealing wound.  She also has been struggling with her MCT.    She is here today for followup.  She tells me that she has been having palpitations for the past 6-8 weeks.  She describes it as a fluttering  sensation but also has a fast heart beat as well and they are 2 different things.  She also has had some brief stabbing sensation in her chest at times only lasting a second at a time and is sharp.  This is not anything like what she had with her angina in the past.  She has chronic DOE but thinks that this has gotten worse.   She has chronic LE edema but it has been very well controlled. She denies any PND, orthopnea or syncope. At times she has dizziness but not a major problem.  She is compliant with her meds and is tolerating meds with no SE.    Prior CV studies:   The following studies were reviewed today:  none  Past Medical History:  Diagnosis Date  . Asthma    "on daily RX and rescue inhaler" (04/18/2018)  . Chronic diastolic CHF (congestive heart failure) (HCC) 09/2015  . Chronic lower back pain   . Chronic neck pain   . Chronic pain syndrome    Fentanyl Patch  . Colon polyps   . Coronary artery disease    cath with normal LM, 30% LAD, 85% mid RCA and 95% distal RCA s/p PCI of the mid to distal RCA and now on DAPT with ASA and Ticagrelor.    . Eczema   . Excessive daytime sleepiness 11/26/2015  . Fibromyalgia   . Gallstones   . GERD (gastroesophageal reflux  disease)   . Heart murmur    "noted for the 1st time on 04/18/2018"  . History of blood transfusion 07/2010   "S/P oophorectomy"  . History of gout   . History of hiatal hernia 1980s   "gone now" (04/18/2018)  . Hyperlipidemia   . Hypertension    takes Metoprolol and Enalapril daily  . Hypothyroidism    takes Synthroid daily  . IBS (irritable bowel syndrome)   . Migraine    "nothing in the 2000s" (04/18/2018)  . Mixed connective tissue disease (HCC)   . NAFLD (nonalcoholic fatty liver disease)   . Pneumonia    "several times" (04/18/2018)  . Rheumatoid arthritis (HCC)    "hands, elbows, shoulders, probably knees" (04/18/2018)  . Scoliosis   . Spondylosis   . Type II diabetes mellitus (HCC)    takes Metformin and Hum R  daily (04/18/2018)   Past Surgical History:  Procedure Laterality Date  . ABDOMINAL HYSTERECTOMY  06/2005   "w/right ovariy"  . AMPUTATION Right 01/28/2020    RIGHT FOURTH AND FIFTH RAY AMPUTATION  . AMPUTATION Right 01/28/2020   Procedure: RIGHT FOURTH AND FIFTH RAY AMPUTATION,;  Surgeon: [name], MD;  Location: [facility] OR;  Service: Orthopedics;  Laterality: Right;  . APPENDECTOMY    . BREAST BIOPSY Bilateral    9 total (04/18/2018)  . CORONARY ANGIOPLASTY WITH STENT PLACEMENT  10/01/2015   normal LM, 30% LAD, 85% mid RCA and 95% distal RCA s/p PCI of the mid to distal RCA and now on DAPT with ASA and Ticagrelor.    . CORONARY STENT INTERVENTION Right 04/18/2018   Procedure: CORONARY STENT INTERVENTION;  Surgeon: [name], MD;  Location: [facility] INVASIVE CV LAB;  Service: Cardiovascular;  Laterality: Right;  . DILATION AND CURETTAGE OF UTERUS    . FRACTURE SURGERY    . LAPAROSCOPIC CHOLECYSTECTOMY    . LEFT HEART CATH AND CORONARY ANGIOGRAPHY N/A 04/18/2018   Procedure: LEFT HEART CATH AND CORONARY ANGIOGRAPHY;  Surgeon: [name], MD;  Location: [facility] INVASIVE CV LAB;  Service: Cardiovascular;  Laterality: N/A;  . MUSCLE BIOPSY Left    "leg"  . OOPHORECTOMY  07/2010  . RADIOLOGY WITH ANESTHESIA N/A 05/25/2016   Procedure: RADIOLOGY WITH ANESTHESIA;  Surgeon: Medication Radiologist, MD;  Location: [facility] OR;  Service: Radiology;  Laterality: N/A;  . TONSILLECTOMY AND ADENOIDECTOMY    . WRIST FRACTURE SURGERY Left    "crushed it"     Current Meds  Medication Sig  . acetaminophen (TYLENOL) 650 MG CR tablet Take 1,300 mg by mouth in the morning and at bedtime.  Marland Kitchen albuterol (PROVENTIL) (2.5 MG/3ML) 0.083% nebulizer solution USE 1 VIAL IN NEBULIZER EVERY 4 HOURS AS NEEDED FOR WHEEZING OR  FOR  SHORTNESS  OF  BREATH  . albuterol (VENTOLIN HFA) 108 (90 Base) MCG/ACT inhaler Inhale 2 puffs into the lungs every 4 (four) hours as needed for wheezing or shortness of breath.  Marland Kitchen  aspirin EC 81 MG tablet Take 81 mg by mouth daily.  . clotrimazole-betamethasone (LOTRISONE) cream Apply 1 application topically 2 (two) times daily.  Marland Kitchen diltiazem (DILT-XR) 240 MG 24 hr capsule Take 1 capsule (240 mg total) by mouth daily.  . diphenhydrAMINE (BENADRYL) 25 MG tablet Take 25 mg by mouth daily as needed for allergies.  Marland Kitchen EPINEPHrine (EPIPEN 2-PAK) 0.3 mg/0.3 mL IJ SOAJ injection USE AS DIRECTED FOR SEVERE ALLERGIC REACTION.  Marland Kitchen esomeprazole (NEXIUM) 40 MG capsule Take 40 mg by mouth daily as  needed (Heartburn).  . fluconazole (DIFLUCAN) 150 MG tablet Take 150 mg by mouth as needed (Yeast infection).  . fluticasone (FLONASE) 50 MCG/ACT nasal spray USE 2 SPRAY(S) IN EACH NOSTRIL ONCE DAILY AS NEEDED FOR  ALLERGIES  OR  RHINITIS  . furosemide (LASIX) 20 MG tablet Take 0.5 tablets (10 mg total) by mouth daily. As needed for swelling  . gabapentin (NEURONTIN) 300 MG capsule Take 1,600 mg by mouth as directed. 400 mg in morning, 400 mg at noon, 800 mg at night  . glucose blood test strip 1 each 4 (four) times daily.   . hydroxychloroquine (PLAQUENIL) 200 MG tablet Take 1 tablet (200 mg total) by mouth 2 (two) times daily.  . insulin regular human CONCENTRATED (HUMULIN R) 500 UNIT/ML injection Inject 0.08-0.3 mLs (40-150 Units total) into the skin 3 (three) times daily with meals.  . INSULIN SYRINGE 1CC/29G (B-D INSULIN SYRINGE) 29G X 1/2" 1 ML MISC Use three times daily with insulin.  DX E11.9  . isosorbide mononitrate (IMDUR) 30 MG 24 hr tablet Take 1 tablet (30 mg total) by mouth daily.  Marland Kitchen levocetirizine (XYZAL) 5 MG tablet TAKE 1 TABLET BY MOUTH ONCE DAILY IN THE EVENING  . levothyroxine (SYNTHROID) 200 MCG tablet TAKE 1 TABLET BY MOUTH ONCE DAILY BEFORE BREAKFAST  . magnesium oxide (MAG-OX) 400 MG tablet Take 400-1,600 mg by mouth 2 (two) times daily as needed (cramps).   . metFORMIN (GLUCOPHAGE-XR) 500 MG 24 hr tablet Take 2 tablets by mouth twice daily  . nitroGLYCERIN (NITROSTAT) 0.4  MG SL tablet DISSOLVE ONE TABLET UNDER THE TONGUE EVERY 5 MINUTES AS NEEDED FOR CHEST PAIN.  DO NOT EXCEED A TOTAL OF 3 DOSES IN 15 MINUTES  . Oxycodone HCl 10 MG TABS Take 10 mg by mouth every 6 (six) hours as needed (pain).   . potassium chloride (KLOR-CON) 10 MEQ tablet Take 1 tab for every 20 mg of Lasix taken.  . pramipexole (MIRAPEX) 0.5 MG tablet Take 6 mg by mouth See admin instructions. Take 0.5 mg up to 6 times daily as needed for restless legs  . promethazine (PHENERGAN) 25 MG tablet TAKE 1 TABLET BY MOUTH EVERY 8 HOURS AS NEEDED FOR NAUSEA AND FOR VOMITING  . saccharomyces boulardii (FLORASTOR) 250 MG capsule Take 250 mg by mouth 3 (three) times daily.  . valsartan (DIOVAN) 160 MG tablet Take 1 tablet (160 mg total) by mouth daily.  . Vitamin D, Ergocalciferol, (DRISDOL) 1.25 MG (50000 UNIT) CAPS capsule TAKE 1 CAPSULE BY MOUTH ONCE A WEEK (EVERY  7  DAYS)  TAKE  ON  FRIDAY     Allergies:   Fish allergy, Fish oil, Omega-3 fatty acids, Crestor [rosuvastatin], Gabapentin, Lovastatin, Metronidazole, Red yeast rice [cholestin], Rotigotine, Atrovent hfa [ipratropium bromide hfa], Red yeast rice extract, Victoza [liraglutide], Enalapril, Suprep [na sulfate-k sulfate-mg sulf], and Tape   Social History   Tobacco Use  . Smoking status: Former Smoker    Packs/day: 1.00    Years: 19.00    Pack years: 19.00    Types: Cigarettes    Start date: 61    Quit date: 02/11/2004    Years since quitting: 16.9  . Smokeless tobacco: Never Used  Vaping Use  . Vaping Use: Never used  Substance Use Topics  . Alcohol use: Yes    Comment: RARE  . Drug use: Not Currently    Types: Marijuana    Comment: "only in my teens"     Family Hx: The patient's family  history includes Allergic rhinitis in her father; Asthma in her father and son; Breast cancer in her paternal aunt; CAD in her father and mother; Heart attack in her father and mother; Hypertension in her brother, brother, and mother; Lung cancer  in her paternal aunt; Pancreatic cancer in her paternal aunt.  ROS:   Please see the history of present illness.     All other systems reviewed and are negative.   Labs/Other Tests and Data Reviewed:    Recent Labs: 05/04/2020: Hemoglobin 10.5; Platelets 292; TSH 2.170 10/27/2020: ALT 22; BUN 17; Creatinine, Ser 0.88; Potassium 4.5; Sodium 136   Recent Lipid Panel Lab Results  Component Value Date/Time   CHOL 194 10/27/2020 08:09 AM   CHOL 176 05/04/2020 09:47 AM   TRIG 223.0 (H) 10/27/2020 08:09 AM   HDL 46.60 10/27/2020 08:09 AM   HDL 41 05/04/2020 09:47 AM   CHOLHDL 4 10/27/2020 08:09 AM   LDLCALC 100 (H) 05/04/2020 09:47 AM   LDLDIRECT 121.0 10/27/2020 08:09 AM    Wt Readings from Last 3 Encounters:  02/02/21 (!) 340 lb 12.8 oz (154.6 kg)  10/27/20 (!) 331 lb 8 oz (150.4 kg)  08/17/20 (!) 327 lb (148.3 kg)     Objective:    Vital Signs:  BP (!) 126/58   Pulse 90   Ht  (1.575 m)   Wt (!) 340 lb 12.8 oz (154.6 kg)   BMI 62.33 kg/m    GEN: Well nourished, well developed in no acute distress HEENT: Normal NECK: No JVD; No carotid bruits LYMPHATICS: No lymphadenopathy CARDIAC:RRR, no murmurs, rubs, gallops RESPIRATORY:  Clear to auscultation without rales, wheezing or rhonchi  ABDOMEN: Soft, non-tender, non-distended MUSCULOSKELETAL:  1+ LE edema; No deformity  SKIN: Warm and dry. Chronic venous stasis changes in LE NEUROLOGIC:  Alert and oriented x 3 PSYCHIATRIC:  Normal affect    ASSESSMENT & PLAN:    1.  PAF  -this occurred in the setting of sepsis and bacteremia and has not had any further occurrence.   -She was not placed on anticoagulation event with CHADS2VASC score of 5 since this occurred in the setting of acute illness.  Decision was to start DOAC if she had a reoccurrence.  -Her BB was stopped due to asthma  -she is now having palpitations and I am concerned that she may be having PAF -I will get an event monitor to assess for  arrhythmias -Continue prescription drug management with Diltiazem XR  daily  2.  ASCAD  - cath 04/2018 showedsevere stenosis of the ostial RCA, 99% stenosed, treated with successful PCI + DES placement. The previously placed stents in the mid and distal RCA were patent withonlyminimal stenosis. The LAD had only mild, 20%, disease. -she denies any anginal symptoms since last OV -Continue prescription drug management with ASA  daily, Imdur  daily -she is statin intolerant  3.  Hypertension  -BP controlled on exam today -Continue prescription drug management with Cardizem CD  daily and  Valsartan  daily -I have personally reviewed and interpreted outside labs performed by patient's PCP which showed SCR of 0.88 and K+ 4.5 in Feb 2022  4.  Hyperlipidemia - she is statin intolerant.  -SHe was referred back to lipid clinic for PCSK 9i but this has not occurred.  -I have personally reviewed and interpreted outside labs performed by patient's PCP which showed LDL 100, HDL 46 and TAG 223 in Feb 2022 -refer to lipid clinic  5.  RA with  MCTD - no evidence of pulmonary HTN on last echo.  -repeat 2D echo since last one was 2019  6.  DOE -I suspect that this is multifactorial from morbid obesity with increased weight gain, asthma, sedentary state, fibromyalgia and deconditioning.  -check 2D echo -I will get a Lexiscan myoiew to rule out ischemia -Shared Decision Making/Informed Consent The risks [chest pain, shortness of breath, cardiac arrhythmias, dizziness, blood pressure fluctuations, myocardial infarction, stroke/transient ischemic attack, nausea, vomiting, allergic reaction, radiation exposure, metallic taste sensation and life-threatening complications (estimated to be 1 in 10,000)], benefits (risk stratification, diagnosing coronary artery disease, treatment guidance) and alternatives of a nuclear stress test were discussed in detail with Ms. [name] and she agrees to  proceed. -  Medication Adjustments/Labs and Tests Ordered: Current medicines are reviewed at length with the patient today.  Concerns regarding medicines are outlined above.  Tests Ordered: Orders Placed This Encounter  Procedures  . EKG 12-Lead   Medication Changes: No orders of the defined types were placed in this encounter.   Disposition:  Follow up in 2 week(s) video with extender  Signed, Armanda Magic, MD  02/02/2021 8:35 AM    [facility]

## 2021-02-05 ENCOUNTER — Other Ambulatory Visit: Payer: Self-pay | Admitting: Family Medicine

## 2021-02-05 DIAGNOSIS — J45909 Unspecified asthma, uncomplicated: Secondary | ICD-10-CM

## 2021-02-15 ENCOUNTER — Ambulatory Visit (HOSPITAL_BASED_OUTPATIENT_CLINIC_OR_DEPARTMENT_OTHER): Payer: 59

## 2021-02-15 ENCOUNTER — Ambulatory Visit (INDEPENDENT_AMBULATORY_CARE_PROVIDER_SITE_OTHER): Payer: 59

## 2021-02-15 ENCOUNTER — Telehealth (HOSPITAL_COMMUNITY): Payer: Self-pay | Admitting: *Deleted

## 2021-02-15 DIAGNOSIS — I480 Paroxysmal atrial fibrillation: Secondary | ICD-10-CM | POA: Diagnosis not present

## 2021-02-15 NOTE — Telephone Encounter (Signed)
Patient given detailed instructions per Myocardial Perfusion Study Information Sheet for the test on 02/21/21 at 1:15. Patient notified to arrive 15 minutes early and that it is imperative to arrive on time for appointment to keep from having the test rescheduled.  If you need to cancel or reschedule your appointment, please call the office within 24 hours of your appointment. . Patient verbalized understanding.Daneil Dolin

## 2021-02-21 ENCOUNTER — Other Ambulatory Visit: Payer: Self-pay

## 2021-02-21 ENCOUNTER — Ambulatory Visit (HOSPITAL_COMMUNITY): Payer: 59 | Attending: Cardiology

## 2021-02-21 ENCOUNTER — Telehealth: Payer: Self-pay | Admitting: Orthopedic Surgery

## 2021-02-21 ENCOUNTER — Telehealth: Payer: Self-pay | Admitting: Family

## 2021-02-21 DIAGNOSIS — R0600 Dyspnea, unspecified: Secondary | ICD-10-CM | POA: Insufficient documentation

## 2021-02-21 DIAGNOSIS — R0609 Other forms of dyspnea: Secondary | ICD-10-CM

## 2021-02-21 MED ORDER — REGADENOSON 0.4 MG/5ML IV SOLN
0.4000 mg | Freq: Once | INTRAVENOUS | Status: AC
Start: 2021-02-21 — End: 2021-02-21
  Administered 2021-02-21: 0.4 mg via INTRAVENOUS

## 2021-02-21 MED ORDER — TECHNETIUM TC 99M TETROFOSMIN IV KIT
32.0000 | PACK | Freq: Once | INTRAVENOUS | Status: AC | PRN
Start: 2021-02-21 — End: 2021-02-21
  Administered 2021-02-21: 32 via INTRAVENOUS
  Filled 2021-02-21: qty 32

## 2021-02-21 NOTE — Telephone Encounter (Signed)
Called patient got recording mailbox is full  could not leave message    will try again later  Mychart message  patient need an appointment with Denny Peon for Foot sore

## 2021-02-21 NOTE — Telephone Encounter (Signed)
Called patient left message on voicemail to return call to schedule an MRI review appointment with Dr. Dean 

## 2021-02-22 ENCOUNTER — Encounter: Payer: Self-pay | Admitting: Orthopedic Surgery

## 2021-02-22 ENCOUNTER — Ambulatory Visit (INDEPENDENT_AMBULATORY_CARE_PROVIDER_SITE_OTHER): Payer: 59 | Admitting: Orthopedic Surgery

## 2021-02-22 ENCOUNTER — Ambulatory Visit (INDEPENDENT_AMBULATORY_CARE_PROVIDER_SITE_OTHER): Payer: 59

## 2021-02-22 ENCOUNTER — Ambulatory Visit (HOSPITAL_COMMUNITY): Payer: 59 | Attending: Cardiovascular Disease

## 2021-02-22 ENCOUNTER — Telehealth: Payer: Self-pay

## 2021-02-22 DIAGNOSIS — L97514 Non-pressure chronic ulcer of other part of right foot with necrosis of bone: Secondary | ICD-10-CM

## 2021-02-22 DIAGNOSIS — M25571 Pain in right ankle and joints of right foot: Secondary | ICD-10-CM

## 2021-02-22 LAB — MYOCARDIAL PERFUSION IMAGING
LV dias vol: 101 mL (ref 46–106)
LV sys vol: 57 mL
Peak HR: 108 {beats}/min
Rest HR: 102 {beats}/min
SDS: 3
SRS: 4
SSS: 7
TID: 0.74

## 2021-02-22 MED ORDER — TECHNETIUM TC 99M TETROFOSMIN IV KIT
32.5000 | PACK | Freq: Once | INTRAVENOUS | Status: AC | PRN
Start: 2021-02-22 — End: 2021-02-22
  Administered 2021-02-22: 32.5 via INTRAVENOUS
  Filled 2021-02-22: qty 33

## 2021-02-22 NOTE — Telephone Encounter (Signed)
Patient called she is requesting rx refill for Doxycycline, diflucan and phenergan call back:[phone]

## 2021-02-22 NOTE — Telephone Encounter (Signed)
You saw this pt this morning please see message below.

## 2021-02-23 ENCOUNTER — Other Ambulatory Visit: Payer: Self-pay | Admitting: Orthopedic Surgery

## 2021-02-23 MED ORDER — DOXYCYCLINE HYCLATE 100 MG PO TABS: 100.0000 mg | ORAL_TABLET | Freq: Two times a day (BID) | ORAL | 0 refills | Status: DC

## 2021-02-23 MED ORDER — PROMETHAZINE HCL 25 MG PO TABS: ORAL_TABLET | ORAL | 0 refills | Status: DC

## 2021-02-23 MED ORDER — FLUCONAZOLE 150 MG PO TABS: 150.0000 mg | ORAL_TABLET | ORAL | 1 refills | Status: DC | PRN

## 2021-02-23 NOTE — Telephone Encounter (Signed)
Responded to pt's mychart message to advise that this has been done.

## 2021-03-01 ENCOUNTER — Encounter: Payer: Self-pay | Admitting: Family Medicine

## 2021-03-01 ENCOUNTER — Telehealth (INDEPENDENT_AMBULATORY_CARE_PROVIDER_SITE_OTHER): Payer: 59 | Admitting: Family Medicine

## 2021-03-01 ENCOUNTER — Ambulatory Visit (INDEPENDENT_AMBULATORY_CARE_PROVIDER_SITE_OTHER): Payer: 59 | Admitting: Family

## 2021-03-01 ENCOUNTER — Encounter: Payer: Self-pay | Admitting: Orthopedic Surgery

## 2021-03-01 ENCOUNTER — Other Ambulatory Visit: Payer: Self-pay

## 2021-03-01 DIAGNOSIS — E1169 Type 2 diabetes mellitus with other specified complication: Secondary | ICD-10-CM

## 2021-03-01 DIAGNOSIS — E559 Vitamin D deficiency, unspecified: Secondary | ICD-10-CM | POA: Diagnosis not present

## 2021-03-01 DIAGNOSIS — E669 Obesity, unspecified: Secondary | ICD-10-CM | POA: Diagnosis not present

## 2021-03-01 DIAGNOSIS — L03115 Cellulitis of right lower limb: Secondary | ICD-10-CM | POA: Diagnosis not present

## 2021-03-01 DIAGNOSIS — Z89421 Acquired absence of other right toe(s): Secondary | ICD-10-CM

## 2021-03-01 MED ORDER — PROMETHAZINE HCL 25 MG PO TABS: ORAL_TABLET | ORAL | 2 refills | Status: DC

## 2021-03-01 MED ORDER — VITAMIN D (ERGOCALCIFEROL) 1.25 MG (50000 UNIT) PO CAPS: ORAL_CAPSULE | ORAL | 3 refills | Status: DC

## 2021-03-01 NOTE — Progress Notes (Signed)
Office Visit Note   Patient: [name]           Date of Birth: 11/26/1960           MRN: [id number] Visit Date: 03/01/2021              Requested by: Sharlene Dory, DO [address] [place],  [place] [address] PCP: Sharlene Dory, DO  Chief Complaint  Patient presents with   Right Foot - Follow-up      HPI: The patient is a 60 year old woman seen today in follow-up for ulceration to her right foot with cellulitis.  She was placed on doxycycline last week which she feels that her wound and drainage has greatly improved.  She has been using dry dressings as instructed she has been nonweightbearing she does complain of some burning over the dorsum of her foot from the Ace wrap and compression.  Assessment & Plan: Visit Diagnoses:  1. 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 Cellulitis of right lower limb     Plan: She will stop the doxycycline after 14 days she will follow-up in the office about 3 days after that.  Continue with dry dressings as instructed.  Continue nonweightbearing discussed return precautions.  Patient quite concerned for possibility of further limb salvage surgery.  Follow-Up Instructions: No follow-ups on file.   Ortho Exam  Patient is alert, oriented, no adenopathy, well-dressed, normal affect, normal respiratory effort.  On examination bilateral lower extremities she has pitting edema with mild erythema this is her baseline there is no weeping.  No open ulcers. On examination of the right lower extremity she does have erythema and mild edema over the dorsum foot from the ankle to the toes this is much improved from last week.  She has 1 8 mm in diameter ulcer that is superficial along her incision of her ray amputation.  To the lateral column plantar aspect of her foot she does have a 1 cm in diameter ulcer this is 2 mm deep there is 25% necrotic tissue.  Flat pink tissue in remainder of  ulcer.  There is no probing.  No odor no active drainage  Imaging: No results found. No images are attached to the encounter.  Labs: Lab Results  Component Value Date   HGBA1C 7.1 (H) 10/27/2020   HGBA1C 9.0 (H) 01/28/2020   HGBA1C 9.1 (H) 08/15/2019   ESRSEDRATE 37 (H) 01/02/2018   CRP 9.8 (H) 01/02/2018   REPTSTATUS 02/03/2020 FINAL 01/28/2020   GRAMSTAIN  01/28/2020    ABUNDANT WBC PRESENT,BOTH PMN AND MONONUCLEAR ABUNDANT GRAM POSITIVE COCCI ABUNDANT GRAM NEGATIVE RODS FEW GRAM POSITIVE RODS    CULT  01/28/2020    FEW PROTEUS MIRABILIS FEW STAPHYLOCOCCUS AUREUS FEW ENTEROCOCCUS FAECALIS ABUNDANT BACTEROIDES SPECIES BETA LACTAMASE NEGATIVE Performed at [facility], [address] [place], [place] [address]    LABORGA PROTEUS MIRABILIS 01/28/2020   LABORGA STAPHYLOCOCCUS AUREUS 01/28/2020   LABORGA ENTEROCOCCUS FAECALIS 01/28/2020     Lab Results  Component Value Date   ALBUMIN 4.1 10/27/2020   ALBUMIN 4.0 05/04/2020   ALBUMIN 3.2 (L) 01/21/2020    Lab Results  Component Value Date   MG 2.2 10/20/2018   MG 2.2 10/19/2018   MG 1.2 (L) 10/17/2018   Lab Results  Component Value Date   VD25OH 69.04 07/25/2017    No results found for: PREALBUMIN CBC EXTENDED Latest Ref Rng &  Units 05/04/2020 01/31/2020 01/21/2020  WBC 3.4 - 10.8 x10E3/uL 7.4 9.1 -  RBC 3.77 - 5.28 x10E6/uL 4.50 4.11 -  HGB 11.1 - 15.9 g/dL 10.5(L) 10.1(L) 12.6  HCT 34.0 - 46.6 % 35.4 34.6(L) 37.0  PLT 150 - 450 x10E3/uL 292 340 -  NEUTROABS 1.7 - 7.7 K/uL - - -  LYMPHSABS 0.7 - 4.0 K/uL - - -     There is no height or weight on file to calculate BMI.  Orders:  No orders of the defined types were placed in this encounter.  No orders of the defined types were placed in this encounter.    Procedures: No procedures performed  Clinical Data: No additional findings.  ROS:  All other systems negative, except as noted in the HPI. Review of Systems  Constitutional:  Negative  for chills, fatigue and fever.  Cardiovascular:  Positive for leg swelling.  Musculoskeletal:  Negative for myalgias.  Skin:  Positive for wound. Negative for color change.   Objective: Vital Signs: There were no vitals taken for this visit.  Specialty Comments:  No specialty comments available.  PMFS History: Patient Active Problem List   Diagnosis Date Noted   Sjogren's syndrome (HCC) 02/11/2020   Acute hematogenous osteomyelitis of right foot (HCC) 01/28/2020   Cutaneous abscess of right foot    Subacute osteomyelitis, right ankle and foot (HCC)    Statin intolerance 08/16/2019   PAF (paroxysmal atrial fibrillation) (HCC) 01/06/2019   Gram-negative bacteremia 10/18/2018   Streptococcal bacteremia 10/18/2018   Ulcer of lower extremity, limited to breakdown of skin (HCC) 10/03/2018   CAD S/P percutaneous coronary angioplasty 04/19/2018   Essential hypertension    Moderate persistent asthma 03/21/2018   NAFLD (nonalcoholic fatty liver disease)    Recurrent cellulitis of lower extremity 01/02/2018   Cellulitis of lower leg 01/02/2018   Chronic diarrhea 05/08/2017   H/O Clostridium difficile infection 05/08/2017   Rectal bleeding 05/08/2017   Hyperbilirubinemia 04/29/2016   Hyponatremia 04/29/2016   Cellulitis of leg, right 03/09/2016   Mild persistent asthma 02/15/2016   Allergic rhinitis due to pollen 02/15/2016   Anaphylactic reaction due to food 02/10/2016   Diabetes mellitus type 2 in obese (HCC) 02/10/2016   Coronary artery disease involving native coronary artery of native heart without angina pectoris 02/10/2016   Gastroesophageal reflux disease without esophagitis 02/10/2016   Atopic eczema 02/10/2016   Cervical nerve root disorder 12/15/2015   Lumbar radiculopathy 12/15/2015   Daytime somnolence 11/26/2015   Venous stasis dermatitis of both lower extremities 02/11/2015   Morbid obesity (HCC) 06/19/2014   B-complex deficiency 03/26/2014   Benign essential HTN  03/26/2014   Chronic pain associated with significant psychosocial dysfunction 03/26/2014   Diaphragmatic hernia 03/26/2014   Gastroesophageal reflux disease 03/26/2014   Hammer toe 03/26/2014   H/O neoplasm 03/26/2014   Adaptive colitis 03/26/2014   Diabetic polyneuropathy (HCC) 03/26/2014   Deafness, sensorineural 03/26/2014   Fibromyalgia 01/20/2014   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/20/2014   Insulin dependent diabetes mellitus (HCC) 11/11/2012   Hyperlipidemia LDL goal <70 11/11/2012   Mixed connective tissue disease (HCC) 11/11/2012   Hypothyroidism 11/11/2012   Uncomplicated asthma 11/11/2012   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HCC) 02/20/2012   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     Eczema    Excessive daytime sleepiness 11/26/2015   Fibromyalgia    Gallstones    GERD (gastroesophageal reflux disease)    Heart murmur    "noted for the 1st time on 04/18/2018"   History of blood transfusion 07/2010   "S/P oophorectomy"   History of gout    History of hiatal hernia 1980s   "gone now" (04/18/2018)   Hyperlipidemia    Hypertension    takes Metoprolol and Enalapril daily   Hypothyroidism    takes Synthroid daily   IBS (irritable bowel syndrome)    Migraine    "nothing in the 2000s" (04/18/2018)   Mixed connective tissue disease (HCC)    NAFLD (nonalcoholic fatty liver disease)    Pneumonia    "several times" (04/18/2018)   Rheumatoid arthritis (HCC)     "hands, elbows, shoulders, probably knees" (04/18/2018)   Scoliosis    Spondylosis    Type II diabetes mellitus (HCC)    takes Metformin and Hum R daily (04/18/2018)    Family History  Problem Relation Age of Onset   CAD Mother    Hypertension Mother    Heart attack Mother    CAD Father    Heart attack Father    Allergic rhinitis Father    Asthma Father    Hypertension Brother    Hypertension Brother    Pancreatic cancer Paternal Aunt    Breast cancer Paternal Aunt    Lung cancer Paternal Aunt    Asthma Son     Past Surgical History:  Procedure Laterality Date   ABDOMINAL HYSTERECTOMY  06/2005   "w/right ovariy"   AMPUTATION Right 01/28/2020    RIGHT FOURTH AND FIFTH RAY AMPUTATION   AMPUTATION Right 01/28/2020   Procedure: RIGHT FOURTH AND FIFTH RAY AMPUTATION,;  Surgeon: [name], MD;  Location: [facility] OR;  Service: Orthopedics;  Laterality: Right;   APPENDECTOMY     BREAST BIOPSY Bilateral    9 total (04/18/2018)   CORONARY ANGIOPLASTY WITH STENT PLACEMENT  10/01/2015   normal LM, 30% LAD, 85% mid RCA and 95% distal RCA s/p PCI of the mid to distal RCA and now on DAPT with ASA and Ticagrelor.     CORONARY STENT INTERVENTION Right 04/18/2018   Procedure: CORONARY STENT INTERVENTION;  Surgeon: [name], MD;  Location: [facility];  Service: Cardiovascular;  Laterality: Right;   DILATION AND CURETTAGE OF UTERUS     FRACTURE SURGERY     LAPAROSCOPIC CHOLECYSTECTOMY     LEFT HEART CATH AND CORONARY ANGIOGRAPHY N/A 04/18/2018   Procedure: LEFT HEART CATH AND CORONARY ANGIOGRAPHY;  Surgeon: [name], MD;  Location: [facility];  Service: Cardiovascular;  Laterality: N/A;   MUSCLE BIOPSY Left    "leg"   OOPHORECTOMY  07/2010   RADIOLOGY WITH ANESTHESIA N/A 05/25/2016   Procedure: RADIOLOGY WITH ANESTHESIA;  Surgeon: Medication Radiologist, MD;  Location: [facility] OR;  Service: Radiology;  Laterality: N/A;   TONSILLECTOMY AND ADENOIDECTOMY      WRIST FRACTURE SURGERY Left    "crushed it"  Social History   Occupational History   Not on file  Tobacco Use   Smoking status: Former    Packs/day: 1.00    Years: 19.00    Pack years: 19.00    Types: Cigarettes    Start date: 22    Quit date: 02/11/2004    Years since quitting: 17.0   Smokeless tobacco: Never  Vaping Use   Vaping Use: Never used  Substance and Sexual Activity   Alcohol use: Yes    Comment: RARE   Drug use: Not Currently    Types: Marijuana    Comment: "only in my teens"   Sexual activity: Yes    Birth control/protection: Surgical

## 2021-03-01 NOTE — Progress Notes (Signed)
Subjective:   Chief Complaint  Patient presents with   Follow-up    [name] is a 60 y.o. female here for follow-up of diabetes.  Due to COVID-19 pandemic, we are interacting via web portal for an electronic face-to-face visit. I verified patient's ID using 2 identifiers. Patient agreed to proceed with visit via this method. Patient is at home, I am at office. Patient and I are present for visit.  [name]'s self monitored glucose range is low 100's fasting and mid 100's random.  Patient denies hypoglycemic reactions. She checks her glucose levels 2 time(s) per day. Patient does require insulin.   Medications include: metformin XR 1000 mg bid Diet is fair.  Exercise: none  Pt w hx of Vit D def. Everything she stops the weekly dose, her levels plummet. She would like to just stay on it.   Past Medical History:  Diagnosis Date   Asthma    "on daily RX and rescue inhaler" (04/18/2018)   Chronic diastolic CHF (congestive heart failure) (HCC) 09/2015   Chronic lower back pain    Chronic neck pain    Chronic pain syndrome    Fentanyl Patch   Colon polyps    Coronary artery disease    cath with normal LM, 30% LAD, 85% mid RCA and 95% distal RCA s/p PCI of the mid to distal RCA and now on DAPT with ASA and Ticagrelor.     Eczema    Excessive daytime sleepiness 11/26/2015   Fibromyalgia    Gallstones    GERD (gastroesophageal reflux disease)    Heart murmur    "noted for the 1st time on 04/18/2018"   History of blood transfusion 07/2010   "S/P oophorectomy"   History of gout    History of hiatal hernia 1980s   "gone now" (04/18/2018)   Hyperlipidemia    Hypertension    takes Metoprolol and Enalapril daily   Hypothyroidism    takes Synthroid daily   IBS (irritable bowel syndrome)    Migraine    "nothing in the 2000s" (04/18/2018)   Mixed connective tissue disease (HCC)    NAFLD (nonalcoholic fatty liver disease)    Pneumonia    "several times" (04/18/2018)   Rheumatoid arthritis  (HCC)    "hands, elbows, shoulders, probably knees" (04/18/2018)   Scoliosis    Spondylosis    Type II diabetes mellitus (HCC)    takes Metformin and Hum R daily (04/18/2018)     Related testing: Retinal exam: Done Pneumovax: done  Objective:  No conversational dyspnea Age appropriate judgment and insight Nml affect and mood  Assessment:   Diabetes mellitus type 2 in obese (HCC) - Plan: Lipid panel, Hemoglobin A1c, Microalbumin / creatinine urine ratio, Comprehensive metabolic panel  Vitamin D deficiency - Plan: Vitamin D, Ergocalciferol, (DRISDOL) 1.25 MG (50000 UNIT) CAPS capsule, VITAMIN D 25 Hydroxy (Vit-D Deficiency, Fractures)   Plan:   Chronic, uncontrolled. Cont Metformin XR 1000 mg/d, cont insulin which she adjusts based on her readings. Higher than usual lately due to foot infection possibly requiring amputation. Counseled on diet and exercise. Ck labs.  Chronic, unsure if stable. Cont weekly supp. Ck levels.  F/u in 3-6 mo. The patient voiced understanding and agreement to the plan.  Jilda Roche [place], DO 03/01/21 12:48 PM

## 2021-03-01 NOTE — Progress Notes (Signed)
Office Visit Note   Patient: [name]           Date of Birth: 1961/05/28           MRN: [id number] Visit Date: 02/22/2021              Requested by: Sharlene Dory, DO [address] [place],  [place] [address] PCP: Sharlene Dory, DO  Chief Complaint  Patient presents with   Right Foot - Pain      HPI: Patient is a 60 year old woman who presents with ulceration of the lateral aspect of the right foot patient has been using silver cell.  Patient complains of drainage.    Assessment & Plan: Visit Diagnoses:  1. Pain in right ankle and joints of right foot   2. Right foot ulcer, with necrosis of bone (HCC)     Plan: Patient will continue with wound care.  An order was placed for doxycycline.  Patient states that due to side effects of doxycycline she requests Diflucan and Phenergan and this was provided.  By report patient has an MRI scan ordered I will go ahead and place a new order for MRI scan of the right foot.  Follow-Up Instructions: Return in about 1 week (around 03/01/2021).   Ortho Exam  Patient is alert, oriented, no adenopathy, well-dressed, normal affect, normal respiratory effort. Examination patient has 2 ulcers one probes to bone there is maceration and drainage.  Imaging: No results found. No images are attached to the encounter.  Labs: Lab Results  Component Value Date   HGBA1C 7.1 (H) 10/27/2020   HGBA1C 9.0 (H) 01/28/2020   HGBA1C 9.1 (H) 08/15/2019   ESRSEDRATE 37 (H) 01/02/2018   CRP 9.8 (H) 01/02/2018   REPTSTATUS 02/03/2020 FINAL 01/28/2020   GRAMSTAIN  01/28/2020    ABUNDANT WBC PRESENT,BOTH PMN AND MONONUCLEAR ABUNDANT GRAM POSITIVE COCCI ABUNDANT GRAM NEGATIVE RODS FEW GRAM POSITIVE RODS    CULT  01/28/2020    FEW PROTEUS MIRABILIS FEW STAPHYLOCOCCUS AUREUS FEW ENTEROCOCCUS FAECALIS ABUNDANT BACTEROIDES SPECIES BETA LACTAMASE NEGATIVE Performed at [facility], [address],  [place], [place] [address]    LABORGA PROTEUS MIRABILIS 01/28/2020   LABORGA STAPHYLOCOCCUS AUREUS 01/28/2020   LABORGA ENTEROCOCCUS FAECALIS 01/28/2020     Lab Results  Component Value Date   ALBUMIN 4.1 10/27/2020   ALBUMIN 4.0 05/04/2020   ALBUMIN 3.2 (L) 01/21/2020    Lab Results  Component Value Date   MG 2.2 10/20/2018   MG 2.2 10/19/2018   MG 1.2 (L) 10/17/2018   Lab Results  Component Value Date   VD25OH 69.04 07/25/2017    No results found for: PREALBUMIN CBC EXTENDED Latest Ref Rng & Units 05/04/2020 01/31/2020 01/21/2020  WBC 3.4 - 10.8 x10E3/uL 7.4 9.1 -  RBC 3.77 - 5.28 x10E6/uL 4.50 4.11 -  HGB 11.1 - 15.9 g/dL 10.5(L) 10.1(L) 12.6  HCT 34.0 - 46.6 % 35.4 34.6(L) 37.0  PLT 150 - 450 x10E3/uL 292 340 -  NEUTROABS 1.7 - 7.7 K/uL - - -  LYMPHSABS 0.7 - 4.0 K/uL - - -     There is no height or weight on file to calculate BMI.  Orders:  Orders Placed This Encounter  Procedures   XR Foot Complete Right   No orders of the defined types were placed in this encounter.    Procedures: No procedures performed  Clinical Data: No additional findings.  ROS:  All other systems  negative, except as noted in the HPI. Review of Systems  Objective: Vital Signs: There were no vitals taken for this visit.  Specialty Comments:  No specialty comments available.  PMFS History: Patient Active Problem List   Diagnosis Date Noted   Sjogren's syndrome (HCC) 02/11/2020   Acute hematogenous osteomyelitis of right foot (HCC) 01/28/2020   Cutaneous abscess of right foot    Subacute osteomyelitis, right ankle and foot (HCC)    Statin intolerance 08/16/2019   PAF (paroxysmal atrial fibrillation) (HCC) 01/06/2019   Gram-negative bacteremia 10/18/2018   Streptococcal bacteremia 10/18/2018   Ulcer of lower extremity, limited to breakdown of skin (HCC) 10/03/2018   CAD S/P percutaneous coronary angioplasty 04/19/2018   Essential hypertension    Moderate persistent asthma  03/21/2018   NAFLD (nonalcoholic fatty liver disease)    Recurrent cellulitis of lower extremity 01/02/2018   Cellulitis of lower leg 01/02/2018   Chronic diarrhea 05/08/2017   H/O Clostridium difficile infection 05/08/2017   Rectal bleeding 05/08/2017   Hyperbilirubinemia 04/29/2016   Hyponatremia 04/29/2016   Cellulitis of leg, right 03/09/2016   Mild persistent asthma 02/15/2016   Allergic rhinitis due to pollen 02/15/2016   Anaphylactic reaction due to food 02/10/2016   Diabetes mellitus type 2 in obese (HCC) 02/10/2016   Coronary artery disease involving native coronary artery of native heart without angina pectoris 02/10/2016   Gastroesophageal reflux disease without esophagitis 02/10/2016   Atopic eczema 02/10/2016   Cervical nerve root disorder 12/15/2015   Lumbar radiculopathy 12/15/2015   Daytime somnolence 11/26/2015   Venous stasis dermatitis of both lower extremities 02/11/2015   Morbid obesity (HCC) 06/19/2014   B-complex deficiency 03/26/2014   Benign essential HTN 03/26/2014   Chronic pain associated with significant psychosocial dysfunction 03/26/2014   Diaphragmatic hernia 03/26/2014   Gastroesophageal reflux disease 03/26/2014   Hammer toe 03/26/2014   H/O neoplasm 03/26/2014   Adaptive colitis 03/26/2014   Diabetic polyneuropathy (HCC) 03/26/2014   Deafness, sensorineural 03/26/2014   Fibromyalgia 01/20/2014   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/20/2014   Insulin dependent diabetes mellitus (HCC) 11/11/2012   Hyperlipidemia LDL goal <70 11/11/2012   Mixed connective tissue disease (HCC) 11/11/2012   Hypothyroidism 11/11/2012   Uncomplicated asthma 11/11/2012   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HCC) 02/20/2012   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     Eczema    Excessive daytime sleepiness 11/26/2015   Fibromyalgia    Gallstones    GERD (gastroesophageal reflux disease)    Heart murmur    "noted for the 1st time on 04/18/2018"   History of blood transfusion 07/2010   "S/P oophorectomy"   History of gout    History of hiatal hernia 1980s   "gone now" (04/18/2018)   Hyperlipidemia    Hypertension    takes Metoprolol and Enalapril daily   Hypothyroidism    takes Synthroid  daily   IBS (irritable bowel syndrome)    Migraine    "nothing in the 2000s" (04/18/2018)   Mixed connective tissue disease (HCC)    NAFLD (nonalcoholic fatty liver disease)    Pneumonia    "several times" (04/18/2018)   Rheumatoid arthritis (HCC)    "hands, elbows, shoulders, probably knees" (04/18/2018)   Scoliosis    Spondylosis    Type II diabetes mellitus (HCC)    takes Metformin and Hum R daily (04/18/2018)    Family History  Problem Relation Age of Onset   CAD Mother    Hypertension Mother    Heart attack Mother    CAD Father    Heart attack Father    Allergic rhinitis Father    Asthma Father    Hypertension Brother    Hypertension Brother    Pancreatic cancer Paternal Aunt    Breast cancer Paternal Aunt    Lung cancer Paternal Aunt    Asthma Son     Past Surgical History:  Procedure Laterality Date   ABDOMINAL HYSTERECTOMY  06/2005   "w/right ovariy"   AMPUTATION Right 01/28/2020    RIGHT FOURTH AND FIFTH RAY AMPUTATION   AMPUTATION Right 01/28/2020   Procedure: RIGHT FOURTH AND FIFTH RAY AMPUTATION,;  Surgeon: [name], MD;  Location: [facility];  Service: Orthopedics;  Laterality: Right;   APPENDECTOMY     BREAST BIOPSY  Bilateral    9 total (04/18/2018)   CORONARY ANGIOPLASTY WITH STENT PLACEMENT  10/01/2015   normal LM, 30% LAD, 85% mid RCA and 95% distal RCA s/p PCI of the mid to distal RCA and now on DAPT with ASA and Ticagrelor.     CORONARY STENT INTERVENTION Right 04/18/2018   Procedure: CORONARY STENT INTERVENTION;  Surgeon: [name], MD;  [facility]: [facility];  Service: Cardiovascular;  Laterality: Right;   DILATION AND CURETTAGE OF UTERUS     FRACTURE SURGERY     LAPAROSCOPIC CHOLECYSTECTOMY     LEFT HEART CATH AND CORONARY ANGIOGRAPHY N/A 04/18/2018   Procedure: LEFT HEART CATH AND CORONARY ANGIOGRAPHY;  Surgeon: [name], MD;  Location: [facility];  Service: Cardiovascular;  Laterality: N/A;   MUSCLE BIOPSY Left    "leg"   OOPHORECTOMY  07/2010   RADIOLOGY WITH ANESTHESIA N/A 05/25/2016   Procedure: RADIOLOGY WITH ANESTHESIA;  Surgeon: Medication Radiologist, MD;  Location: [facility];  Service: Radiology;  Laterality: N/A;   TONSILLECTOMY AND ADENOIDECTOMY     WRIST FRACTURE SURGERY Left    "crushed it"   Social History   Occupational History   Not on file  Tobacco Use   Smoking status: Former    Packs/day: 1.00    Years: 19.00    Pack years: 19.00    Types: Cigarettes    Start date: 68    Quit date: 02/11/2004    Years since quitting: 17.0   Smokeless tobacco: Never  Vaping Use   Vaping Use: Never used  Substance and Sexual Activity   Alcohol use: Yes    Comment: RARE   Drug use: Not Currently    Types: Marijuana    Comment: "only in my teens"   Sexual activity: Yes    Birth control/protection: Surgical

## 2021-03-04 ENCOUNTER — Encounter: Payer: Self-pay | Admitting: Cardiology

## 2021-03-04 ENCOUNTER — Ambulatory Visit (HOSPITAL_COMMUNITY): Payer: 59 | Attending: Cardiology

## 2021-03-04 ENCOUNTER — Other Ambulatory Visit: Payer: Self-pay

## 2021-03-04 ENCOUNTER — Ambulatory Visit (INDEPENDENT_AMBULATORY_CARE_PROVIDER_SITE_OTHER): Payer: 59 | Admitting: Pharmacist

## 2021-03-04 ENCOUNTER — Other Ambulatory Visit (HOSPITAL_COMMUNITY): Payer: 59

## 2021-03-04 DIAGNOSIS — R0609 Other forms of dyspnea: Secondary | ICD-10-CM

## 2021-03-04 DIAGNOSIS — R0600 Dyspnea, unspecified: Secondary | ICD-10-CM | POA: Insufficient documentation

## 2021-03-04 DIAGNOSIS — G720 Drug-induced myopathy: Secondary | ICD-10-CM | POA: Diagnosis not present

## 2021-03-04 DIAGNOSIS — T466X5A Adverse effect of antihyperlipidemic and antiarteriosclerotic drugs, initial encounter: Secondary | ICD-10-CM | POA: Diagnosis not present

## 2021-03-04 DIAGNOSIS — I350 Nonrheumatic aortic (valve) stenosis: Secondary | ICD-10-CM | POA: Insufficient documentation

## 2021-03-04 DIAGNOSIS — E785 Hyperlipidemia, unspecified: Secondary | ICD-10-CM

## 2021-03-04 LAB — ECHOCARDIOGRAM COMPLETE
AR max vel: 1.98 cm2
AV Area VTI: 1.98 cm2
AV Area mean vel: 1.95 cm2
AV Mean grad: 9 mmHg
AV Peak grad: 18.1 mmHg
Ao pk vel: 2.13 m/s
Area-P 1/2: 2.76 cm2
S' Lateral: 3.4 cm

## 2021-03-04 MED ORDER — PERFLUTREN LIPID MICROSPHERE
1.0000 mL | INTRAVENOUS | Status: AC | PRN
  Administered 2021-03-04: 1 mL via INTRAVENOUS

## 2021-03-04 NOTE — Progress Notes (Signed)
Patient ID: [name]                 DOB: 17-Sep-1960                    MRN: [id number]     HPI: [name] is a 60 y.o. female patient referred to lipid clinic by Dr Mayford Knife. PMH is significant for ASCVD with prior cath showing normal LM, 30% LAD, 85% mid RCA and 95% distal RCA s/p PCI of the mid to distal RCA, HLD with statin intolerance, HTN, chronic diastolic CHF with last echo in 03/2018 showing normal LVEF 60-65%, DM2, fibromyalgia with chronic pain, chronic venous stasis dermatitis, morbid obesity, chronic DOE, and RA with mixed connective tissue disease. Repeat cath for chest pain 04/2018 showed severe stenosis of the ostial RCA, 99% stenosed, treated with PCI and DES. She was referred to lipid clinic for PCSK9i therapy but pt didn't follow through with appt. Also with brief afib in setting of sepsis and bacteremia without further episodes.  Pt presents today with her husband. Reports debilitating joint pain on rosuvastatin and lovastatin that occurred within a few days of statin initiation. Side effects occurred about 1 week after drug discontinuation. She is also intolerant to red yeast rice, niaspan, and fish oil. She was not fasting when lipid panel was checked in February (TG 223). She has been drinking more ginger ale since then to help with nausea.  Current Medications: none  Intolerances:  Rosuvastatin 40mg  daily - joint pain Lovastatin - joint pain Niaspan 1000mg  daily - flushing Fish oil - throat swelling Red yeast rice - muscle and joint pain  Risk Factors: CAD s/p PCI, DM, CHF, obesity  LDL goal: 55mg /dL  Diet:  Breakfast - eggs and toast Lunch - hummus and vegetables or crackers; leftovers Dinner - burgers, chicken, porkchops, meatloaf Snacks - crackers, cheese Drinks - ginger ale (2 per day - regular; reports diet soda upsets her stomach), water, buttermilk (helps with reflux)  Exercise: In a wheelchair, trouble with her foot  Family History: Allergic  rhinitis in her father; Asthma in her father and son; Breast cancer in her paternal aunt; CAD in her father and mother; Heart attack in her father and mother; Hypertension in her brother, brother, and mother; Lung cancer in her paternal aunt; Pancreatic cancer in her paternal aunt.  Social History: Former smoker 1 PPD for 19 years, quit in 1986. Rare alcohol use, no drug use.  Labs: 10/27/20: TC 194, TG 223, HDL 46.6, LDL-D 121 (no LLT)  Past Medical History:  Diagnosis Date   Asthma    "on daily RX and rescue inhaler" (04/18/2018)   Chronic diastolic CHF (congestive heart failure) (HCC) 09/2015   Chronic lower back pain    Chronic neck pain    Chronic pain syndrome    Fentanyl Patch   Colon polyps    Coronary artery disease    cath with normal LM, 30% LAD, 85% mid RCA and 95% distal RCA s/p PCI of the mid to distal RCA and now on DAPT with ASA and Ticagrelor.     Eczema    Excessive daytime sleepiness 11/26/2015   Fibromyalgia    Gallstones    GERD (gastroesophageal reflux disease)    Heart murmur    "noted for the 1st time on 04/18/2018"   History of blood transfusion 07/2010   "S/P oophorectomy"   History of gout    History of hiatal hernia 1980s   "  gone now" (04/18/2018)   Hyperlipidemia    Hypertension    takes Metoprolol and Enalapril daily   Hypothyroidism    takes Synthroid daily   IBS (irritable bowel syndrome)    Migraine    "nothing in the 2000s" (04/18/2018)   Mixed connective tissue disease (HCC)    NAFLD (nonalcoholic fatty liver disease)    Pneumonia    "several times" (04/18/2018)   Rheumatoid arthritis (HCC)    "hands, elbows, shoulders, probably knees" (04/18/2018)   Scoliosis    Spondylosis    Type II diabetes mellitus (HCC)    takes Metformin and Hum R daily (04/18/2018)    Current Outpatient Medications on File Prior to Visit  Medication Sig Dispense Refill   acetaminophen (TYLENOL) 650 MG CR tablet Take 1,300 mg by mouth in the morning and at bedtime.      albuterol (PROVENTIL) (2.5 MG/3ML) 0.083% nebulizer solution USE 1 VIAL IN NEBULIZER EVERY 4 HOURS AS NEEDED FOR WHEEZING OR  FOR  SHORTNESS  OF  BREATH 450 mL 0   albuterol (VENTOLIN HFA) 108 (90 Base) MCG/ACT inhaler Inhale 2 puffs into the lungs every 4 (four) hours as needed for wheezing or shortness of breath. 18 g 5   aspirin EC 81 MG tablet Take 81 mg by mouth daily.     clotrimazole-betamethasone (LOTRISONE) cream Apply 1 application topically 2 (two) times daily. 30 g 0   diltiazem (DILT-XR) 240 MG 24 hr capsule Take 1 capsule (240 mg total) by mouth daily. 90 capsule 3   diphenhydrAMINE (BENADRYL) 25 MG tablet Take 25 mg by mouth daily as needed for allergies.     doxycycline (VIBRA-TABS) 100 MG tablet Take 1 tablet (100 mg total) by mouth 2 (two) times daily. 60 tablet 0   EPINEPHrine (EPIPEN 2-PAK) 0.3 mg/0.3 mL IJ SOAJ injection USE AS DIRECTED FOR SEVERE ALLERGIC REACTION. 2 each 0   esomeprazole (NEXIUM) 40 MG capsule Take 40 mg by mouth daily as needed (Heartburn).     fluconazole (DIFLUCAN) 150 MG tablet Take 1 tablet (150 mg total) by mouth as needed (Yeast infection). 3 tablet 1   fluticasone (FLONASE) 50 MCG/ACT nasal spray USE 2 SPRAY(S) IN EACH NOSTRIL ONCE DAILY AS NEEDED FOR  ALLERGIES  OR  RHINITIS 16 g 0   furosemide (LASIX) 20 MG tablet Take 0.5 tablets (10 mg total) by mouth daily. As needed for swelling 30 tablet 3   gabapentin (NEURONTIN) 300 MG capsule Take 1,600 mg by mouth as directed. 400 mg in morning, 400 mg at noon, 800 mg at night     glucose blood test strip 1 each 4 (four) times daily.      hydroxychloroquine (PLAQUENIL) 200 MG tablet Take 1 tablet (200 mg total) by mouth 2 (two) times daily. 180 tablet 0   insulin regular human CONCENTRATED (HUMULIN R) 500 UNIT/ML injection Inject 0.08-0.3 mLs (40-150 Units total) into the skin 3 (three) times daily with meals. 20 mL 4   INSULIN SYRINGE 1CC/29G (B-D INSULIN SYRINGE) 29G X 1/2" 1 ML MISC Use three times daily  with insulin.  DX E11.9 200 each 3   isosorbide mononitrate (IMDUR) 30 MG 24 hr tablet Take 1 tablet (30 mg total) by mouth daily. 90 tablet 3   levocetirizine (XYZAL) 5 MG tablet TAKE 1 TABLET BY MOUTH ONCE DAILY IN THE EVENING 90 tablet 0   levothyroxine (SYNTHROID) 200 MCG tablet TAKE 1 TABLET BY MOUTH ONCE DAILY BEFORE BREAKFAST 90 tablet 0  magnesium oxide (MAG-OX) 400 MG tablet Take 400-1,600 mg by mouth 2 (two) times daily as needed (cramps).      metFORMIN (GLUCOPHAGE-XR) 500 MG 24 hr tablet Take 2 tablets by mouth twice daily 360 tablet 0   nitroGLYCERIN (NITROSTAT) 0.4 MG SL tablet DISSOLVE ONE TABLET UNDER THE TONGUE EVERY 5 MINUTES AS NEEDED FOR CHEST PAIN.  DO NOT EXCEED A TOTAL OF 3 DOSES IN 15 MINUTES 25 tablet 0   Oxycodone HCl 10 MG TABS Take 10 mg by mouth every 6 (six) hours as needed (pain).      potassium chloride (KLOR-CON) 10 MEQ tablet Take 1 tab for every 20 mg of Lasix taken. 180 tablet 2   pramipexole (MIRAPEX) 0.5 MG tablet Take 6 mg by mouth See admin instructions. Take 0.5 mg up to 6 times daily as needed for restless legs     promethazine (PHENERGAN) 25 MG tablet TAKE 1 TABLET BY MOUTH EVERY 8 HOURS AS NEEDED FOR NAUSEA AND FOR VOMITING 90 tablet 2   saccharomyces boulardii (FLORASTOR) 250 MG capsule Take 250 mg by mouth 3 (three) times daily.     valsartan (DIOVAN) 160 MG tablet Take 1 tablet (160 mg total) by mouth daily. 90 tablet 3   Vitamin D, Ergocalciferol, (DRISDOL) 1.25 MG (50000 UNIT) CAPS capsule TAKE 1 CAPSULE BY MOUTH ONCE A WEEK (EVERY  7  DAYS)  TAKE  ON  FRIDAY 12 capsule 3   No current facility-administered medications on file prior to visit.    Allergies  Allergen Reactions   Fish Allergy Anaphylaxis    INCLUDES OMEGA 3 OILS   Fish Oil Anaphylaxis and Swelling    THROAT SWELLS INCLUDES FISH AS A CLASS   Omega-3 Fatty Acids Swelling    Throat swelling     Crestor [Rosuvastatin] Other (See Comments)    Extreme joint pain, to this & other  statins   Gabapentin Anxiety    Anxiety on high doses   Lovastatin Other (See Comments)    EXTREME JOINT PAIN   Metronidazole Nausea Only and Swelling    Headache and shakes Nausea and vomiting   Red Yeast Rice [Cholestin] Other (See Comments)    Muscle pain and severe joint pain.    Rotigotine Swelling    Extreme edema    Atrovent Hfa [Ipratropium Bromide Hfa] Other (See Comments)    wheezing   Red Yeast Rice Extract     Joint pain   Victoza [Liraglutide]     Unsure of reaction    Enalapril Cough    Cough Cough   Suprep [Na Sulfate-K Sulfate-Mg Sulf] Nausea And Vomiting   Tape Other (See Comments)    Electrodes causes skin breakdown    Assessment/Plan:  1. Hyperlipidemia - Baseline LDL 121 above goal < 55 due to hx of ASCVD and DM. Pt is intolerant to multiple statins. Discussed Nexlizet and PCSK9i therapy today. Pt prefers to try Nexlizet as she is concerned about developing a side effect on Repatha and then having the drug be in her system for the next 2 weeks. Will submit prior authorization for Nexlizet and call pt once approved. Also encouraged her to cut back on ginger ale to help improve TG. Will schedule follow up labs once pt starts on therapy.   [name], PharmD, BCACP, CPP  [facility] [address] [place], [place] [address] Phone: [phone]; Fax: [phone] 03/04/2021 11:02 AM

## 2021-03-04 NOTE — Patient Instructions (Addendum)
It was nice to see you today  Your LDL is 121 and your goal is < 55  I'll submit information to your insurance to see if they will cover Nexlizet. This is a pill you take each day that lowers your LDL by ~40-45%  Repatha or Praluent injections are another option as well. They are injected once every 2 weeks subcutaneously and lower your LDL by about 60%  I'll call you when I hear back from your insurance  Use more ginger chews, tea, etc instead of ginger ale to help cut back on sugar and lower your triglycerides

## 2021-03-07 ENCOUNTER — Telehealth: Payer: Self-pay | Admitting: Pharmacist

## 2021-03-07 DIAGNOSIS — G720 Drug-induced myopathy: Secondary | ICD-10-CM

## 2021-03-07 MED ORDER — NEXLIZET 180-10 MG PO TABS: 1.0000 | ORAL_TABLET | Freq: Every day | ORAL | 11 refills | Status: DC

## 2021-03-07 NOTE — Telephone Encounter (Addendum)
Nexlizet prior authorization approved through 03/03/22. Rx sent to pharmacy along with $10 copay card (ID [id number], BIN [id number], PCN [employer], GRP [id number]), pt aware. Scheduled f/u labs in 2 months to assess efficacy.

## 2021-03-10 ENCOUNTER — Telehealth: Payer: Self-pay | Admitting: Orthopedic Surgery

## 2021-03-10 NOTE — Telephone Encounter (Signed)
Pt called asking to have her referral for an MRI switch to [facility] imaging on [address]; pt states if we hurry and send it she can get an appt for 03/12/21. Pt would like a CB as soon as this has been changed.   [phone]

## 2021-03-10 NOTE — Telephone Encounter (Signed)
Done and pt aware

## 2021-03-15 ENCOUNTER — Other Ambulatory Visit: Payer: Self-pay

## 2021-03-15 ENCOUNTER — Ambulatory Visit (INDEPENDENT_AMBULATORY_CARE_PROVIDER_SITE_OTHER): Payer: 59 | Admitting: Family

## 2021-03-15 ENCOUNTER — Encounter: Payer: Self-pay | Admitting: Family

## 2021-03-15 DIAGNOSIS — L97911 Non-pressure chronic ulcer of unspecified part of right lower leg limited to breakdown of skin: Secondary | ICD-10-CM | POA: Diagnosis not present

## 2021-03-15 NOTE — Progress Notes (Signed)
Office Visit Note   Patient: [name]           Date of Birth: 03/17/1961           MRN: [id number] Visit Date: 03/15/2021              Requested by: Sharlene Dory, DO [address] [place],  [place] [address] PCP: Sharlene Dory, DO  Chief Complaint  Patient presents with   Right Foot - Follow-up      HPI: The patient is a 60 year old woman seen today in follow-up for wound to the right foot.  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  On June 14 she did have radiographs which were concerning for osteomyelitis.    She had stopped her doxycycline on Saturday.  She did resume it today.  Assessment & Plan: Visit Diagnoses: No diagnosis found.  Plan: She is scheduled for MRI on Saturday.  She will follow-up in the office next week for MRI review.  She will continue with her current wound care.  Follow-Up Instructions: No follow-ups on file.   Ortho Exam  Patient is alert, oriented, no adenopathy, well-dressed, normal affect, normal respiratory effort. On examination of the right lower extremity she does have erythema and edema up to the tibial tubercle this is her baseline.  There is no weeping.  Of the right foot she does have a ulcer along the lateral column this is 1 cm in diameter 2 mm deep there is 50% fibrinous exudative tissue there is flat pink tissue there is no probing no active drainage no surrounding erythema no maceration  Imaging: No results found. No images are attached to the encounter.  Labs: Lab Results  Component Value Date   HGBA1C 7.1 (H) 10/27/2020   HGBA1C 9.0 (H) 01/28/2020   HGBA1C 9.1 (H) 08/15/2019   ESRSEDRATE 37 (H) 01/02/2018   CRP 9.8 (H) 01/02/2018   REPTSTATUS 02/03/2020 FINAL 01/28/2020   GRAMSTAIN  01/28/2020    ABUNDANT WBC PRESENT,BOTH PMN AND  MONONUCLEAR ABUNDANT GRAM POSITIVE COCCI ABUNDANT GRAM NEGATIVE RODS FEW GRAM POSITIVE RODS    CULT  01/28/2020    FEW PROTEUS MIRABILIS FEW STAPHYLOCOCCUS AUREUS FEW ENTEROCOCCUS FAECALIS ABUNDANT BACTEROIDES SPECIES BETA LACTAMASE NEGATIVE Performed at [facility], [address], [place], [place] [address]    LABORGA PROTEUS MIRABILIS 01/28/2020   LABORGA STAPHYLOCOCCUS AUREUS 01/28/2020   LABORGA ENTEROCOCCUS FAECALIS 01/28/2020     Lab Results  Component Value Date   ALBUMIN 4.1 10/27/2020   ALBUMIN 4.0 05/04/2020   ALBUMIN 3.2 (L) 01/21/2020    Lab Results  Component Value Date   MG 2.2 10/20/2018   MG 2.2 10/19/2018   MG 1.2 (L) 10/17/2018   Lab Results  Component Value Date   VD25OH 69.04 07/25/2017    No results found for: PREALBUMIN CBC EXTENDED Latest Ref Rng & Units 05/04/2020 01/31/2020 01/21/2020  WBC 3.4 - 10.8 x10E3/uL 7.4 9.1 -  RBC 3.77 - 5.28 x10E6/uL 4.50 4.11 -  HGB 11.1 - 15.9 g/dL 10.5(L) 10.1(L) 12.6  HCT 34.0 - 46.6 % 35.4 34.6(L) 37.0  PLT 150 - 450 x10E3/uL 292 340 -  NEUTROABS 1.7 - 7.7 K/uL - - -  LYMPHSABS 0.7 - 4.0 K/uL - - -     There is no height or  weight on file to calculate BMI.  Orders:  No orders of the defined types were placed in this encounter.  No orders of the defined types were placed in this encounter.    Procedures: No procedures performed  Clinical Data: No additional findings.  ROS:  All other systems negative, except as noted in the HPI. Review of Systems  Objective: Vital Signs: There were no vitals taken for this visit.  Specialty Comments:  No specialty comments available.  PMFS History: Patient Active Problem List   Diagnosis Date Noted   Statin myopathy 03/04/2021   Aortic stenosis 03/04/2021   Sjogren's syndrome (HCC) 02/11/2020   Acute hematogenous osteomyelitis of right foot (HCC) 01/28/2020   Cutaneous abscess of right foot    Subacute osteomyelitis, right ankle and foot  (HCC)    Statin intolerance 08/16/2019   PAF (paroxysmal atrial fibrillation) (HCC) 01/06/2019   Gram-negative bacteremia 10/18/2018   Streptococcal bacteremia 10/18/2018   Ulcer of lower extremity, limited to breakdown of skin (HCC) 10/03/2018   CAD S/P percutaneous coronary angioplasty 04/19/2018   Essential hypertension    Moderate persistent asthma 03/21/2018   NAFLD (nonalcoholic fatty liver disease)    Recurrent cellulitis of lower extremity 01/02/2018   Cellulitis of lower leg 01/02/2018   Chronic diarrhea 05/08/2017   H/O Clostridium difficile infection 05/08/2017   Rectal bleeding 05/08/2017   Hyperbilirubinemia 04/29/2016   Hyponatremia 04/29/2016   Cellulitis of leg, right 03/09/2016   Mild persistent asthma 02/15/2016   Allergic rhinitis due to pollen 02/15/2016   Anaphylactic reaction due to food 02/10/2016   Diabetes mellitus type 2 in obese (HCC) 02/10/2016   Coronary artery disease involving native coronary artery of native heart without angina pectoris 02/10/2016   Gastroesophageal reflux disease without esophagitis 02/10/2016   Atopic eczema 02/10/2016   Cervical nerve root disorder 12/15/2015   Lumbar radiculopathy 12/15/2015   Daytime somnolence 11/26/2015   Venous stasis dermatitis of both lower extremities 02/11/2015   Morbid obesity (HCC) 06/19/2014   B-complex deficiency 03/26/2014   Benign essential HTN 03/26/2014   Chronic pain associated with significant psychosocial dysfunction 03/26/2014   Diaphragmatic hernia 03/26/2014   Gastroesophageal reflux disease 03/26/2014   Hammer toe 03/26/2014   H/O neoplasm 03/26/2014   Adaptive colitis 03/26/2014   Diabetic polyneuropathy (HCC) 03/26/2014   Deafness, sensorineural 03/26/2014   Fibromyalgia 01/20/2014   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/20/2014   Insulin dependent diabetes mellitus (HCC) 11/11/2012   Hyperlipidemia 11/11/2012   Mixed connective tissue disease (HCC) 11/11/2012    Hypothyroidism 11/11/2012   Uncomplicated asthma 11/11/2012   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HCC) 02/20/2012   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     Eczema    Excessive daytime sleepiness 11/26/2015   Fibromyalgia    Gallstones    GERD (  gastroesophageal reflux disease)    Heart murmur    "noted for the 1st time on 04/18/2018"   History of blood transfusion 07/2010   "S/P oophorectomy"   History of gout    History of hiatal hernia 1980s   "gone now" (04/18/2018)   Hyperlipidemia    Hypertension    takes Metoprolol and Enalapril daily   Hypothyroidism    takes Synthroid daily   IBS (irritable bowel syndrome)    Migraine    "nothing in the 2000s" (04/18/2018)   Mixed connective tissue disease (HCC)    NAFLD (nonalcoholic fatty liver disease)    Pneumonia    "several times" (04/18/2018)   Rheumatoid arthritis (HCC)    "hands, elbows, shoulders, probably knees" (04/18/2018)   Scoliosis    Spondylosis    Type II diabetes mellitus (HCC)    takes Metformin and Hum R daily (04/18/2018)    Family History  Problem Relation Age of Onset   CAD Mother    Hypertension Mother    Heart attack Mother    CAD Father    Heart attack Father    Allergic rhinitis Father    Asthma Father    Hypertension Brother    Hypertension Brother    Pancreatic cancer Paternal Aunt    Breast cancer Paternal Aunt    Lung cancer Paternal Aunt     Asthma Son     Past Surgical History:  Procedure Laterality Date   ABDOMINAL HYSTERECTOMY  06/2005   "w/right ovariy"   AMPUTATION Right 01/28/2020    RIGHT FOURTH AND FIFTH RAY AMPUTATION   AMPUTATION Right 01/28/2020   Procedure: RIGHT FOURTH AND FIFTH RAY AMPUTATION,;  Surgeon: [name], MD;  Location: [facility] OR;  Service: Orthopedics;  Laterality: Right;   APPENDECTOMY     BREAST BIOPSY Bilateral    9 total (04/18/2018)   CORONARY ANGIOPLASTY WITH STENT PLACEMENT  10/01/2015   normal LM, 30% LAD, 85% mid RCA and 95% distal RCA s/p PCI of the mid to distal RCA and now on DAPT with ASA and Ticagrelor.     CORONARY STENT INTERVENTION Right 04/18/2018   Procedure: CORONARY STENT INTERVENTION;  Surgeon: [name], MD;  Location: [facility];  Service: Cardiovascular;  Laterality: Right;   DILATION AND CURETTAGE OF UTERUS     FRACTURE SURGERY     LAPAROSCOPIC CHOLECYSTECTOMY     LEFT HEART CATH AND CORONARY ANGIOGRAPHY N/A 04/18/2018   Procedure: LEFT HEART CATH AND CORONARY ANGIOGRAPHY;  Surgeon: [name], MD;  Location: [facility];  Service: Cardiovascular;  Laterality: N/A;   MUSCLE BIOPSY Left    "leg"   OOPHORECTOMY  07/2010   RADIOLOGY WITH ANESTHESIA N/A 05/25/2016   Procedure: RADIOLOGY WITH ANESTHESIA;  Surgeon: Medication Radiologist, MD;  Location: [facility] OR;  Service: Radiology;  Laterality: N/A;   TONSILLECTOMY AND ADENOIDECTOMY     WRIST FRACTURE SURGERY Left    "crushed it"   Social History   Occupational History   Not on file  Tobacco Use   Smoking status: Former    Packs/day: 1.00    Years: 19.00    Pack years: 19.00    Types: Cigarettes    Start date: 15    Quit date: 02/11/2004    Years since quitting: 17.1   Smokeless tobacco: Never  Vaping Use   Vaping Use: Never used  Substance and Sexual Activity   Alcohol use: Yes    Comment: RARE  Drug use: Not Currently    Types: Marijuana    Comment: "only in my teens"    Sexual activity: Yes    Birth control/protection: Surgical

## 2021-03-19 ENCOUNTER — Other Ambulatory Visit: Payer: Self-pay

## 2021-03-19 ENCOUNTER — Ambulatory Visit (HOSPITAL_BASED_OUTPATIENT_CLINIC_OR_DEPARTMENT_OTHER)
Admission: RE | Admit: 2021-03-19 | Discharge: 2021-03-19 | Disposition: A | Discharge status: Home / Self Care | Payer: 59 | Source: Ambulatory Visit | Attending: Orthopedic Surgery | Admitting: Orthopedic Surgery

## 2021-03-19 DIAGNOSIS — L97514 Non-pressure chronic ulcer of other part of right foot with necrosis of bone: Secondary | ICD-10-CM | POA: Insufficient documentation

## 2021-03-19 MED ORDER — GADOBUTROL 1 MMOL/ML IV SOLN
10.0000 mL | Freq: Once | INTRAVENOUS | Status: AC | PRN
  Administered 2021-03-19: 10 mL via INTRAVENOUS

## 2021-03-23 ENCOUNTER — Other Ambulatory Visit: Payer: 59

## 2021-03-28 ENCOUNTER — Other Ambulatory Visit: Payer: Self-pay | Admitting: Family Medicine

## 2021-03-29 ENCOUNTER — Encounter: Payer: 59 | Attending: Physician Assistant | Admitting: Physician Assistant

## 2021-03-29 ENCOUNTER — Other Ambulatory Visit: Payer: Self-pay

## 2021-03-29 DIAGNOSIS — E11621 Type 2 diabetes mellitus with foot ulcer: Secondary | ICD-10-CM | POA: Diagnosis present

## 2021-03-29 DIAGNOSIS — E1140 Type 2 diabetes mellitus with diabetic neuropathy, unspecified: Secondary | ICD-10-CM | POA: Insufficient documentation

## 2021-03-29 DIAGNOSIS — L97512 Non-pressure chronic ulcer of other part of right foot with fat layer exposed: Secondary | ICD-10-CM | POA: Diagnosis not present

## 2021-03-29 DIAGNOSIS — L930 Discoid lupus erythematosus: Secondary | ICD-10-CM | POA: Diagnosis not present

## 2021-03-29 DIAGNOSIS — Z87891 Personal history of nicotine dependence: Secondary | ICD-10-CM | POA: Insufficient documentation

## 2021-03-29 NOTE — Progress Notes (Addendum)
Bonnin, DEMETRIA IWAI. ([id number]) Visit Report for 03/29/2021 Allergy List Details Patient Name: [name], [name] Date of Service: 03/29/2021 10:15 AM Medical Record Number: [id number] Patient Account Number: [id number] Date of Birth/Sex: Aug 30, 1961 (60 y.o. F) Treating RN: Dolan Amen Primary Care Esten Dollar: [name] Other Clinician: Referring Tomi Grandpre: [name] Treating Sadie Hazelett/Extender: Jeri Cos Weeks in Treatment: 0 Allergies Active Allergies Suprep Bowel Prep Kit Fish Containing Products peanut Combivent Atrovent Allergy Notes Electronic Signature(s) Signed: 03/29/2021 3:57:53 PM By: Dolan Amen RN Entered By: Dolan Amen on 03/29/2021 10:28:54 Waldo, Dannah M. ([id number]) -------------------------------------------------------------------------------- Arrival Information Details Patient Name: Holliman, Brenn M. Date of Service: 03/29/2021 10:15 AM Medical Record Number: [id number] Patient Account Number: [id number] Date of Birth/Sex: [birth year] (60 y.o. F) Treating RN: Dolan Amen Primary Care Keelan Pomerleau: [name] Other Clinician: Referring Mikyle Sox: [name] Treating Itzayana Pardy/Extender: Skipper Cliche in Treatment: 0 Visit Information Patient Arrived: Wheel Chair Arrival Time: 10:18 Accompanied By: husband Transfer Assistance: Manual Patient Identification Verified: Yes Secondary Verification Process Completed: Yes Electronic Signature(s) Signed: 03/29/2021 3:57:53 PM By: Dolan Amen RN Entered By: Dolan Amen on 03/29/2021 10:19:03 Stickler, Jaz M. ([id number]) -------------------------------------------------------------------------------- Clinic Level of Care Assessment Details Patient Name: Brideau, Ariany M. Date of Service: 03/29/2021 10:15 AM Medical Record Number: [id number] Patient Account Number: [id number] Date of Birth/Sex: [birth year] (60 y.o. F) Treating RN: Dolan Amen Primary Care Brigham Cobbins:  [name] Other Clinician: Referring Berdell Hostetler: [name] Treating Claxton Levitz/Extender: Skipper Cliche in Treatment: 0 Clinic Level of Care Assessment Items TOOL 2 Quantity Score X - Use when only an EandM is performed on the INITIAL visit 1 0 ASSESSMENTS - Nursing Assessment / Reassessment X - General Physical Exam (combine w/ comprehensive assessment (listed just below) when performed on new 1 20 pt. evals) X- 1 25 Comprehensive Assessment (HX, ROS, Risk Assessments, Wounds Hx, etc.) ASSESSMENTS - Wound and Skin Assessment / Reassessment X - Simple Wound Assessment / Reassessment - one wound 1 5 []  - 0 Complex Wound Assessment / Reassessment - multiple wounds []  - 0 Dermatologic / Skin Assessment (not related to wound area) ASSESSMENTS - Ostomy and/or Continence Assessment and Care []  - Incontinence Assessment and Management 0 []  - 0 Ostomy Care Assessment and Management (repouching, etc.) PROCESS - Coordination of Care X - Simple Patient / Family Education for ongoing care 1 15 []  - 0 Complex (extensive) Patient / Family Education for ongoing care []  - 0 Staff obtains Programmer, systems, Records, Test Results / Process Orders []  - 0 Staff telephones HHA, Nursing Homes / Clarify orders / etc []  - 0 Routine Transfer to another Facility (non-emergent condition) []  - 0 Routine Hospital Admission (non-emergent condition) []  - 0 New Admissions / Biomedical engineer / Ordering NPWT, Apligraf, etc. []  - 0 Emergency Hospital Admission (emergent condition) X- 1 10 Simple Discharge Coordination []  - 0 Complex (extensive) Discharge Coordination PROCESS - Special Needs []  - Pediatric / Minor Patient Management 0 []  - 0 Isolation Patient Management []  - 0 Hearing / Language / Visual special needs []  - 0 Assessment of Community assistance (transportation, D/C planning, etc.) []  - 0 Additional assistance / Altered mentation []  - 0 Support Surface(s) Assessment  (bed, cushion, seat, etc.) INTERVENTIONS - Wound Cleansing / Measurement X - Wound Imaging (photographs - any number of wounds) 1 5 []  - 0 Wound Tracing (instead of photographs) X- 1 5 Simple Wound Measurement - one wound []  - 0 Complex Wound Measurement - multiple wounds Bonadonna, Quantina M. ([id number]) X- 1  5 Simple Wound Cleansing - one wound $RemoveB'[]'AUYoZYQy$  - 0 Complex Wound Cleansing - multiple wounds INTERVENTIONS - Wound Dressings $RemoveBeforeD'[]'RfLGcKhHtnDkfQ$  - Small Wound Dressing one or multiple wounds 0 X- 1 15 Medium Wound Dressing one or multiple wounds $RemoveBeforeD'[]'BQvkPemZbLmmNk$  - 0 Large Wound Dressing one or multiple wounds $RemoveBeforeD'[]'fVvBPAbNUjcUDZ$  - 0 Application of Medications - injection INTERVENTIONS - Miscellaneous $RemoveBeforeD'[]'DXvMuYqSoFCvUK$  - External ear exam 0 $Remo'[]'xEikL$  - 0 Specimen Collection (cultures, biopsies, blood, body fluids, etc.) $RemoveBefor'[]'QqWEEESwMLRo$  - 0 Specimen(s) / Culture(s) sent or taken to Lab for analysis $RemoveBefo'[]'yjFavXetOmM$  - 0 Patient Transfer (multiple staff / Civil Service fast streamer / Similar devices) $RemoveBeforeDE'[]'QHnWtRRnRsYQwtW$  - 0 Simple Staple / Suture removal (25 or less) $Remove'[]'VaBQwmc$  - 0 Complex Staple / Suture removal (26 or more) $Remove'[]'ESSujnN$  - 0 Hypo / Hyperglycemic Management (close monitor of Blood Glucose) X- 1 15 Ankle / Brachial Index (ABI) - do not check if billed separately Has the patient been seen at the hospital within the last three years: Yes Total Score: 120 Level Of Care: New/Established - Level 4 Electronic Signature(s) Signed: 03/29/2021 3:57:53 PM By: Dolan Amen RN Entered By: Dolan Amen on 03/29/2021 11:44:57 Gulden, Veverly M. ([id number]) -------------------------------------------------------------------------------- Lower Extremity Assessment Details Patient Name: Crull, Kaylla M. Date of Service: 03/29/2021 10:15 AM Medical Record Number: [id number] Patient Account Number: [id number] Date of Birth/Sex: 1961-01-30 (60 y.o. F) Treating RN: Dolan Amen Primary Care Allina Riches: [name] Other Clinician: Referring Bethanie Bloxom: [name] Treating Jerusalen Mateja/Extender: Skipper Cliche in Treatment: 0 Edema Assessment Assessed: [Left: No] [Right: Yes] Edema: [Left: Ye] [Right: s] Vascular Assessment Pulses: Dorsalis Pedis Palpable: [Right:No] Doppler Audible: [Right:Yes] Posterior Tibial Palpable: [Right:No] Doppler Audible: [Right:Yes] Blood Pressure: Brachial: [Right:90] Dorsalis Pedis: 112 Ankle: Posterior Tibial: 112 Ankle Brachial Index: [Right:1.24] Electronic Signature(s) Signed: 03/29/2021 3:57:53 PM By: Dolan Amen RN Entered By: Dolan Amen on 03/29/2021 11:01:40 Kuang, Brissa M. ([id number]) -------------------------------------------------------------------------------- Multi Wound Chart Details Patient Name: Karpf, Vicke M. Date of Service: 03/29/2021 10:15 AM Medical Record Number: [id number] Patient Account Number: [id number] Date of Birth/Sex: 31-May-1961 (60 y.o. F) Treating RN: Dolan Amen Primary Care Khaidyn Staebell: [name] Other Clinician: Referring Surah Pelley: [name] Treating Nobie Alleyne/Extender: Skipper Cliche in Treatment: 0 Vital Signs Height(in): 62 Capillary Blood Glucose 129 (mg/dl): Weight(lbs): 320 Pulse(bpm): 86 Body Mass Index(BMI): 44 Blood Pressure(mmHg): 115/69 Temperature(F): 98.2 Respiratory Rate(breaths/min): 20 Photos: [N/A:N/A] Wound Location: Right, Plantar Foot N/A N/A Wounding Event: Gradually Appeared N/A N/A Primary Etiology: Diabetic Wound/Ulcer of the Lower N/A N/A Extremity Comorbid History: Lymphedema, Asthma, Coronary N/A N/A Artery Disease, Hypertension, Type II Diabetes, Lupus Erythematosus, Gout, Osteoarthritis, Osteomyelitis, Neuropathy Date Acquired: 01/09/2021 N/A N/A Weeks of Treatment: 0 N/A N/A Wound Status: Open N/A N/A Measurements L x W x D (cm) 1.5x1.3x0.3 N/A N/A Area (cm) : 1.532 N/A N/A Volume (cm) : 0.459 N/A N/A % Reduction in Area: 0.00% N/A N/A % Reduction in Volume: 0.00% N/A N/A Classification: Grade 3 N/A N/A Earleen Newport  Verification: MRI N/A N/A Exudate Amount: Medium N/A N/A Exudate Type: Serosanguineous N/A N/A Exudate Color: red, brown N/A N/A Wound Margin: Flat and Intact N/A N/A Granulation Amount: Large (67-100%) N/A N/A Granulation Quality: Red, Pink N/A N/A Necrotic Amount: Small (1-33%) N/A N/A Exposed Structures: Fat Layer (Subcutaneous Tissue): N/A N/A Yes Fascia: No Tendon: No Muscle: No Joint: No Bone: No Epithelialization: None N/A N/A Treatment Notes Electronic Signature(s) Signed: 03/29/2021 3:57:53 PM By: Dolan Amen RN Entered By: Dolan Amen on 03/29/2021 11:33:54 Hensen, Gregg M. ([id number]) Chiappetta, Janiylah M. (  [id number]) -------------------------------------------------------------------------------- Multi-Disciplinary Care Plan Details Patient Name: Ortega, Rikia M. Date of Service: 03/29/2021 10:15 AM Medical Record Number: [id number] Patient Account Number: [id number] Date of Birth/Sex: 1961-06-16 (60 y.o. F) Treating RN: Dolan Amen Primary Care Lashay Osborne: [name] Other Clinician: Referring Samiyah Stupka: [name] Treating Claudie Brickhouse/Extender: Skipper Cliche in Treatment: 0 Active Inactive Electronic Signature(s) Signed: 04/05/2021 8:18:10 AM By: Dolan Amen RN Previous Signature: 03/29/2021 3:57:53 PM Version By: Dolan Amen RN Entered By: Dolan Amen on 04/05/2021 08:18:10 Tadlock, Brenna M. ([id number]) -------------------------------------------------------------------------------- Pain Assessment Details Patient Name: Pevey, Jerita M. Date of Service: 03/29/2021 10:15 AM Medical Record Number: [id number] Patient Account Number: [id number] Date of Birth/Sex: 12/17/1960 (60 y.o. F) Treating RN: Dolan Amen Primary Care Miachel Nardelli: [name] Other Clinician: Referring Clive Parcel: [name] Treating Cranston Koors/Extender: Skipper Cliche in Treatment: 0 Active Problems Location of Pain Severity and Description of  Pain Patient Has Paino Yes Site Locations Pain Location: Pain in Ulcers Rate the pain. Current Pain Level: 3 Pain Management and Medication Current Pain Management: Electronic Signature(s) Signed: 03/29/2021 3:57:53 PM By: Dolan Amen RN Entered By: Dolan Amen on 03/29/2021 10:20:46 Whobrey, [name] ([id number]) -------------------------------------------------------------------------------- Patient/Caregiver Education Details Patient Name: Turlington, Holleigh M. Date of Service: 03/29/2021 10:15 AM Medical Record Number: [id number] Patient Account Number: [id number] Date of Birth/Gender: 1961/06/16 (60 y.o. F) Treating RN: Dolan Amen Primary Care Physician: [name] Other Clinician: Referring Physician: [name] Treating Physician/Extender: Skipper Cliche in Treatment: 0 Education Assessment Education Provided To: Patient Education Topics Provided Infection: Methods: Explain/Verbal Responses: State content correctly Welcome To [facility]: Methods: Explain/Verbal Responses: State content correctly Wound/Skin Impairment: Methods: Explain/Verbal Responses: State content correctly Electronic Signature(s) Signed: 03/29/2021 3:57:53 PM By: Dolan Amen RN Entered By: Dolan Amen on 03/29/2021 11:45:18 Whitefield, Maudell M. ([id number]) -------------------------------------------------------------------------------- Wound Assessment Details Patient Name: Bos, Lekha M. Date of Service: 03/29/2021 10:15 AM Medical Record Number: [id number] Patient Account Number: [id number] Date of Birth/Sex: 1961-01-30 (60 y.o. F) Treating RN: Dolan Amen Primary Care Husayn Reim: [name] Other Clinician: Referring Jovaun Levene: [name] Treating Natha Guin/Extender: Skipper Cliche in Treatment: 0 Wound Status Wound Number: 1 Primary Diabetic Wound/Ulcer of the Lower Extremity Etiology: Wound Location: Right, Plantar Foot Wound  Open Wounding Event: Gradually Appeared Status: Date Acquired: 01/09/2021 Comorbid Lymphedema, Asthma, Coronary Artery Disease, Weeks Of Treatment: 0 History: Hypertension, Type II Diabetes, Lupus Erythematosus, Clustered Wound: No Gout, Osteoarthritis, Osteomyelitis, Neuropathy Photos Wound Measurements Length: (cm) 1.5 Width: (cm) 1.3 Depth: (cm) 0.3 Area: (cm) 1.532 Volume: (cm) 0.459 % Reduction in Area: 0% % Reduction in Volume: 0% Epithelialization: None Tunneling: No Undermining: No Wound Description Classification: Grade 3 Wagner Verification: MRI Wound Margin: Flat and Intact Exudate Amount: Medium Exudate Type: Serosanguineous Exudate Color: red, brown Foul Odor After Cleansing: No Slough/Fibrino No Wound Bed Granulation Amount: Large (67-100%) Exposed Structure Granulation Quality: Red, Pink Fascia Exposed: No Necrotic Amount: Small (1-33%) Fat Layer (Subcutaneous Tissue) Exposed: Yes Necrotic Quality: Adherent Slough Tendon Exposed: No Muscle Exposed: No Joint Exposed: No Bone Exposed: No Electronic Signature(s) Signed: 03/29/2021 3:57:53 PM By: Dolan Amen RN Entered By: Dolan Amen on 03/29/2021 11:31:53 Sprong, Waunetta M. ([id number]) -------------------------------------------------------------------------------- Vitals Details Patient Name: Whitesel, Pieper M. Date of Service: 03/29/2021 10:15 AM Medical Record Number: [id number] Patient Account Number: [id number] Date of Birth/Sex: [birth year] (60 y.o. F) Treating RN: Dolan Amen Primary Care Yazen Rosko: [name] Other Clinician: Referring Lakecia Deschamps: [name] Treating Francia Verry/Extender: Skipper Cliche in Treatment:  0 Vital Signs Time Taken: 10:20 Temperature (F): 98.2 Height (in): 62 Pulse (bpm): 86 Source: Stated Respiratory Rate (breaths/min): 20 Weight (lbs): 320 Blood Pressure (mmHg): 115/69 Source: Stated Capillary Blood Glucose (mg/dl): 129 Body Mass  Index (BMI): 58.5 Reference Range: 80 - 120 mg / dl Electronic Signature(s) Signed: 03/29/2021 3:57:53 PM By: Dolan Amen RN Entered By: Dolan Amen on 03/29/2021 10:26:51

## 2021-03-29 NOTE — Progress Notes (Signed)
[name], [name] VENO. ([id number]) Visit Report for 03/29/2021 Abuse/Suicide Risk Screen Details Patient Name: [name], [name] Date of Service: 03/29/2021 10:15 AM Medical Record Number: [id number] Patient Account Number: [id number] Date of Birth/Sex: Dec 11, 1960 (60 y.o. F) Treating RN: Rogers Blocker Primary Care Azeem Poorman: [name] Other Clinician: Referring Aminah Zabawa: [name] Treating Arlyn Bumpus/Extender: Rowan Blase in Treatment: 0 Abuse/Suicide Risk Screen Items Answer ABUSE RISK SCREEN: Has anyone close to you tried to hurt or harm you recentlyo No Do you feel uncomfortable with anyone in your familyo No Has anyone forced you do things that you didnot want to doo No Electronic Signature(s) Signed: 03/29/2021 3:57:53 PM By: Rogers Blocker RN Entered By: Rogers Blocker on 03/29/2021 10:36:59 Sevin, Calisa M. ([id number]) -------------------------------------------------------------------------------- Activities of Daily Living Details Patient Name: [name], [name] Date of Service: 03/29/2021 10:15 AM Medical Record Number: [id number] Patient Account Number: [id number] Date of Birth/Sex: September 01, 1961 (60 y.o. F) Treating RN: Rogers Blocker Primary Care Harjit Douds: [name] Other Clinician: Referring Ashley Montminy: [name] Treating Connell Bognar/Extender: Rowan Blase in Treatment: 0 Activities of Daily Living Items Answer Activities of Daily Living (Please select one for each item) Drive Automobile Not Able Take Medications Completely Able Use Telephone Completely Able Care for Appearance Completely Able Use Toilet Need Assistance Bath / Shower Need Assistance Dress Self Need Assistance Feed Self Completely Able Walk Need Assistance Get In / Out Bed Need Assistance Housework Completely Able Prepare Meals Completely Able Handle Money Completely Able Shop for Self Completely Able Electronic Signature(s) Signed: 03/29/2021 3:57:53 PM By:  Rogers Blocker RN Entered By: Rogers Blocker on 03/29/2021 10:37:41 Fordham, Maudene M. ([id number]) -------------------------------------------------------------------------------- Education Screening Details Patient Name: [name], [name] Date of Service: 03/29/2021 10:15 AM Medical Record Number: [id number] Patient Account Number: [id number] Date of Birth/Sex: [birth year] (60 y.o. F) Treating RN: Rogers Blocker Primary Care Camarion Weier: [name] Other Clinician: Referring Sanaa Zilberman: [name] Treating Cherrish Vitali/Extender: Rowan Blase in Treatment: 0 Primary Learner Assessed: Patient Learning Preferences/Education Level/Primary Language Learning Preference: Explanation, Demonstration Highest Education Level: College or Above Preferred Language: English Cognitive Barrier Language Barrier: No Translator Needed: No Memory Deficit: No Emotional Barrier: No Cultural/Religious Beliefs Affecting Medical Care: No Physical Barrier Impaired Vision: No Impaired Hearing: Yes Decreased Hand dexterity: No Knowledge/Comprehension Knowledge Level: Medium Comprehension Level: Medium Ability to understand written instructions: Medium Ability to understand verbal instructions: Medium Motivation Anxiety Level: Calm Cooperation: Cooperative Education Importance: Acknowledges Need Interest in Health Problems: Asks Questions Perception: Coherent Willingness to Engage in Self-Management Medium Activities: Readiness to Engage in Self-Management Medium Activities: Electronic Signature(s) Signed: 03/29/2021 3:57:53 PM By: Rogers Blocker RN Entered By: Rogers Blocker on 03/29/2021 10:38:22 Brymer, Dazaria M. ([id number]) -------------------------------------------------------------------------------- Fall Risk Assessment Details Patient Name: [name], [name] Date of Service: 03/29/2021 10:15 AM Medical Record Number: [id number] Patient Account Number: [id number] Date of  Birth/Sex: Feb 25, 1961 (60 y.o. F) Treating RN: Rogers Blocker Primary Care Rashanda Magloire: [name] Other Clinician: Referring Gwendolyn Nishi: [name] Treating Tiauna Whisnant/Extender: Rowan Blase in Treatment: 0 Fall Risk Assessment Items Have you had 2 or more falls in the last 12 monthso 0 No Have you had any fall that resulted in injury in the last 12 monthso 0 Yes FALLS RISK SCREEN History of falling - immediate or within 3 months 0 No Secondary diagnosis (Do you have 2 or more medical diagnoseso) 15 Yes Ambulatory aid None/bed rest/wheelchair/nurse 0 Yes Crutches/cane/walker 0 No Furniture 0 No Intravenous therapy Access/Saline/Heparin Lock 0 No Gait/Transferring Normal/ bed rest/  wheelchair 0 No Weak (short steps with or without shuffle, stooped but able to lift head while walking, may 10 Yes seek support from furniture) Impaired (short steps with shuffle, may have difficulty arising from chair, head down, impaired 0 No balance) Mental Status Oriented to own ability 0 Yes Electronic Signature(s) Signed: 03/29/2021 3:57:53 PM By: Rogers Blocker RN Entered By: Rogers Blocker on 03/29/2021 10:38:49 Burnsed, Yanique M. ([id number]) -------------------------------------------------------------------------------- Foot Assessment Details Patient Name: [name], [name] Date of Service: 03/29/2021 10:15 AM Medical Record Number: [id number] Patient Account Number: [id number] Date of Birth/Sex: 02/24/1961 (60 y.o. F) Treating RN: Rogers Blocker Primary Care Adain Geurin: [name] Other Clinician: Referring Leroy Trim: [name] Treating Marticia Reifschneider/Extender: Rowan Blase in Treatment: 0 Foot Assessment Items Site Locations + = Sensation present, - = Sensation absent, C = Callus, U = Ulcer R = Redness, W = Warmth, M = Maceration, PU = Pre-ulcerative lesion F = Fissure, S = Swelling, D = Dryness Assessment Right: Left: Other Deformity: No No Prior Foot  Ulcer: No No Prior Amputation: No No Charcot Joint: No No Ambulatory Status: Ambulatory With Help Assistance Device: Wheelchair Gait: Academic librarian Signature(s) Signed: 03/29/2021 3:57:53 PM By: Rogers Blocker RN Entered By: Rogers Blocker on 03/29/2021 10:45:00 Menchaca, Unita M. ([id number]) -------------------------------------------------------------------------------- Nutrition Risk Screening Details Patient Name: Prouse, Ayauna M. Date of Service: 03/29/2021 10:15 AM Medical Record Number: [id number] Patient Account Number: [id number] Date of Birth/Sex: 1960-11-14 (60 y.o. F) Treating RN: Rogers Blocker Primary Care Mylan Schwarz: [name] Other Clinician: Referring Takera Rayl: [name] Treating Dalissa Lovin/Extender: Rowan Blase in Treatment: 0 Height (in): 62 Weight (lbs): 320 Body Mass Index (BMI): 58.5 Nutrition Risk Screening Items Score Screening NUTRITION RISK SCREEN: I have an illness or condition that made me change the kind and/or amount of food I eat 0 No I eat fewer than two meals per day 0 No I eat few fruits and vegetables, or milk products 0 No I have three or more drinks of beer, liquor or wine almost every day 0 No I have tooth or mouth problems that make it hard for me to eat 0 No I don't always have enough money to buy the food I need 0 No I eat alone most of the time 0 No I take three or more different prescribed or over-the-counter drugs a day 1 Yes Without wanting to, I have lost or gained 10 pounds in the last six months 0 No I am not always physically able to shop, cook and/or feed myself 0 No Nutrition Protocols Good Risk Protocol 0 No interventions needed Moderate Risk Protocol High Risk Proctocol Risk Level: Good Risk Score: 1 Electronic Signature(s) Signed: 03/29/2021 3:57:53 PM By: Rogers Blocker RN Entered By: Rogers Blocker on 03/29/2021 10:39:23

## 2021-03-30 NOTE — Progress Notes (Signed)
[place], GLENICE CICCONE ([id number]) Visit Report for 03/29/2021 Chief Complaint Document Details Patient Name: [name], [name] Date of Service: 03/29/2021 10:15 AM Medical Record Number: [id number] Patient Account Number: [id number] Date of Birth/Sex: [birth year] (60 y.o. F) Treating RN: Dolan Amen Primary Care Provider: [name] Other Clinician: Referring Provider: [name] Treating Provider/Extender: Skipper Cliche in Treatment: 0 Information Obtained from: Patient Chief Complaint Right foot ulcer Electronic Signature(s) Signed: 03/29/2021 11:21:56 AM By: Worthy Keeler PA-C Entered By: Worthy Keeler on 03/29/2021 11:21:55 Frisby, Takeyla M. ([id number]) -------------------------------------------------------------------------------- Debridement Details Patient Name: [name], [name] Date of Service: 03/29/2021 10:15 AM Medical Record Number: [id number] Patient Account Number: [id number] Date of Birth/Sex: [birth year] (60 y.o. F) Treating RN: Dolan Amen Primary Care Provider: [name] Other Clinician: Referring Provider: [name] Treating Provider/Extender: Skipper Cliche in Treatment: 0 Debridement Performed for Wound #1 Right,Plantar Foot Assessment: Performed By: Physician Tommie Sams., PA-C Debridement Type: Chemical/Enzymatic/Mechanical Agent Used: saline gauze Severity of Tissue Pre Debridement: Fat layer exposed Level of Consciousness (Pre- Awake and Alert procedure): Pre-procedure Verification/Time Out Yes - 11:43 Taken: Start Time: 11:43 Instrument: Other : gauze Bleeding: None Response to Treatment: Procedure was tolerated well Level of Consciousness (Post- Awake and Alert procedure): Post Debridement Measurements of Total Wound Length: (cm) 1.5 Width: (cm) 1.3 Depth: (cm) 0.3 Volume: (cm) 0.459 Character of Wound/Ulcer Post Debridement: Stable Severity of Tissue Post Debridement: Fat layer exposed Post  Procedure Diagnosis Same as Pre-procedure Electronic Signature(s) Signed: 03/29/2021 3:57:53 PM By: Dolan Amen RN Signed: 03/29/2021 6:10:04 PM By: Worthy Keeler PA-C Entered By: Dolan Amen on 03/29/2021 11:43:35 Creason, Marney M. ([id number]) -------------------------------------------------------------------------------- HPI Details Patient Name: [name], [name] Date of Service: 03/29/2021 10:15 AM Medical Record Number: [id number] Patient Account Number: [id number] Date of Birth/Sex: [birth year] (60 y.o. F) Treating RN: Dolan Amen Primary Care Provider: [name] Other Clinician: Referring Provider: [name] Treating Provider/Extender: Skipper Cliche in Treatment: 0 History of Present Illness HPI Description: 03/29/2021 this is a patient who presents for initial evaluation here in the clinic though I have seen her 1 time previously in 2020 this was in [place]. That was actually her initial visit therefore a heel ulceration. Subsequently that did not healing she actually saw me the 1 time in November and then subsequently saw Dr. Dellia Nims through the end of December where she apparently was healed. 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 This is on the right. With that being said right now she has a basically region on the lateral portion of the ray site where she is applying more pressure especially as her foot seems to be starting to turn in and this has become an increasingly significant issue for her to be honest. She does see Dr. Sharol Given and Dr. Sharol Given has had her on doxycycline for quite a bit of time here. Subsequently she is just now wrapping that out. I think that she probably would be benefited by continue the doxycycline for a time while we can work through what we need to do with things here. She does have evidence of osteomyelitis based on  what I saw on the MRI today. This obviously is unfortunate and definitely not something that she was hoping to hear. With that being said I do believe that she would potentially be a strong candidate for hyperbaric oxygen therapy. Also believe  she could be a candidate for total contact cast though we have to be cautious due to the fact that how her foot is bending inward I think that this is good to be a little bit of a concern. We will definitely have to keeping a close eye on things. She does have a history of diabetes mellitus type 2. She also other than the amputation has a medical history positive for hypertension. She has never undergone hyperbaric oxygen therapy previously I did show her the chamber we did talk a little bit about it today as well. Electronic Signature(s) Signed: 03/29/2021 5:54:17 PM By: Worthy Keeler PA-C Entered By: Worthy Keeler on 03/29/2021 17:54:17 Henigan, [name] ([id number]) -------------------------------------------------------------------------------- Physical Exam Details Patient Name: [name], [name] Date of Service: 03/29/2021 10:15 AM Medical Record Number: [id number] Patient Account Number: [id number] Date of Birth/Sex: 01-16-1961 (60 y.o. F) Treating RN: Dolan Amen Primary Care Provider: [name] Other Clinician: Referring Provider: [name] Treating Provider/Extender: Skipper Cliche in Treatment: 0 Constitutional sitting or standing blood pressure is within target range for patient.. pulse regular and within target range for patient.Marland Kitchen respirations regular, non- labored and within target range for patient.Marland Kitchen temperature within target range for patient.. Well-nourished and well-hydrated in no acute distress. Eyes conjunctiva clear no eyelid edema noted. pupils equal round and reactive to light and accommodation. Ears, Nose, Mouth, and Throat no gross abnormality of ear auricles or external auditory canals. normal hearing  noted during conversation. mucus membranes moist. Respiratory normal breathing without difficulty. Cardiovascular 2+ dorsalis pedis/posterior tibialis pulses. no clubbing, cyanosis, significant edema, <3 sec cap refill. Psychiatric this patient is able to make decisions and demonstrates good insight into disease process. Alert and Oriented x 3. pleasant and cooperative. Notes Upon inspection patient's wound bed actually showed signs of fairly good granulation and epithelization at this point. There does not appear to be any evidence of infection which is great news currently I think that is probably attributed to the fact to be honest that the patient has been on oral doxycycline and that likely is cleared up anything that would have been immediately obvious. Nonetheless she does have a wound at the site of noted osteomyelitis which is chronic in nature she also has evidence of a fracture in the proximal 3 to half centimeters of the third metatarsal right around this area is where there is enhancement as well and again that is seem to be consistent potentially with osteomyelitis changes this most likely is probably a pathologic fracture. Nonetheless I do believe based on what I see currently that the patient is get a probably benefit from [place] going ahead and strongly considering hyperbaric oxygen therapy as a means to try to get this wound healed and tried to treat the underlying osteomyelitis completely effectively. Her only other option is probably proceeding with a below-knee amputation which she really wants to try to avoid she tells me. She is seen today with her husband as well in the clinic. Electronic Signature(s) Signed: 03/29/2021 5:55:54 PM By: Worthy Keeler PA-C Entered By: Worthy Keeler on 03/29/2021 17:55:53 Deak, [name] ([id number]) -------------------------------------------------------------------------------- Physician Orders Details Patient Name: Terrien, Catera M. Date  of Service: 03/29/2021 10:15 AM Medical Record Number: [id number] Patient Account Number: [id number] Date of Birth/Sex: 08/31/1961 (60 y.o. 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 - [place] office Medications-Please add to medication list. o P.O. Antibiotics - Extend doxycycline Wound Treatment Wound #1 - Foot Wound Laterality: Plantar, Right Cleanser: Byram Ancillary Kit - 15 Day Supply (DME) (Generic) 3 x Per Week/15 Days Discharge Instructions: Use supplies as instructed; Kit contains: (15) Saline Bullets; (15) 3x3 Gauze; 15 pr Gloves Cleanser: Normal Saline 3 x Per Week/15 Days Discharge Instructions: Wash your hands with soap and water. Remove old dressing, discard into plastic bag and place into trash. Cleanse the wound with Normal Saline prior to applying a clean dressing using gauze sponges, not tissues or cotton balls. Do not scrub or use excessive force. Pat dry using gauze sponges, not tissue or cotton balls. Primary Dressing: Hydrofera Blue Ready Transfer Foam, 2.5x2.5 (in/in) (DME) (Generic) 3 x Per Week/15 Days Discharge Instructions: Apply Hydrofera Blue Ready to wound bed as directed Secondary Dressing: Conforming Guaze Roll-Large (DME) (Generic) 3 x Per Week/15 Days Discharge Instructions: Apply Conforming Stretch Guaze to secure Secondary Dressing: Gauze (DME) (Generic) 3 x Per Week/15 Days Discharge Instructions: Apply dry gauze over hydrofera blue to cover Secured With: 43M  [place] Surgical Tape, 2x2 (in/yd) (DME) (Generic) 3 x Per Week/15 Days Discharge Instructions: Secure rolled gauze Patient Medications Allergies: Suprep Bowel Prep Kit, Fish Containing Products, peanut, Combivent, Atrovent Notifications Medication Indication Start End doxycycline hyclate 03/29/2021 DOSE 1 - oral 100 mg capsule - 1 capsule oral taken 2 times per day for 30 days Electronic Signature(s) Signed: 03/29/2021 5:56:55 PM By: Worthy Keeler PA-C Previous Signature: 03/29/2021 3:57:53 PM Version By: Dolan Amen RN Entered By: Worthy Keeler on 03/29/2021 17:56:55 Granade, Arine M. ([id number]) -------------------------------------------------------------------------------- Problem List Details Patient Name: [name], [name] Date of Service: 03/29/2021 10:15 AM Medical Record Number: [id number] Patient Account Number: [id number] Date of Birth/Sex: [birth year] (60 y.o. 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 ([id number]) -------------------------------------------------------------------------------- Progress Note Details Patient Name: Upchurch, Lillieanna M. Date of Service: 03/29/2021 10:15 AM Medical Record Number: [id number] Patient Account Number: [id number] Date of Birth/Sex:  [birth year] (60 y.o. F) Treating RN: Dolan Amen Primary Care Provider: [name] Other Clinician: Referring Provider: [name] Treating Provider/Extender: Skipper Cliche in Treatment: 0 Subjective Chief Complaint Information obtained from Patient Right foot ulcer History of Present Illness (HPI) 03/29/2021 this is a patient who presents for initial evaluation here in the clinic though I have seen her 1 time previously in 2020 this was in [place]. That was actually her initial visit therefore a heel ulceration. Subsequently that did not healing she actually saw me the 1 time in November and then subsequently saw Dr. Dellia Nims through the end of December where she apparently was  healed. 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 This is on the right. With that being said right now she has a basically region on the lateral portion of the ray site where she is applying more pressure especially as her foot seems to be starting to turn in and this has become an increasingly significant issue for her to be honest. She does see Dr. Sharol Given and Dr. Sharol Given has had her on doxycycline for quite a bit of time here. Subsequently she is just now wrapping that out. I think that she probably would be benefited by continue the doxycycline for a time while we can work through what we need to do with things here. She does have evidence of osteomyelitis based on what I saw on the MRI today. This obviously is unfortunate and definitely not something that she was hoping to hear. With that being said I do believe that she would potentially be a strong candidate for hyperbaric oxygen therapy. Also believe she could be a candidate for total contact cast though we have to be cautious due to the fact that how her foot is bending inward I think that this is good to be a little bit of a concern. We  will definitely have to keeping a close eye on things. She does have a history of diabetes mellitus type 2. She also other than the amputation has a medical history positive for hypertension. She has never undergone hyperbaric oxygen therapy previously I did show her the chamber we did talk a little bit about it today as well. Patient History Information obtained from Patient. Allergies Suprep Bowel Prep Kit, Fish Containing Products, peanut, Combivent, Atrovent Social History Former smoker, Marital Status - Married, Alcohol Use - Rarely, Drug Use - No History, Caffeine Use - Daily. Medical History Eyes Denies history of Cataracts, Glaucoma, Optic Neuritis Hematologic/Lymphatic Patient has history of Lymphedema Denies history of Anemia, Hemophilia, Human Immunodeficiency Virus, Sickle Cell Disease Respiratory Patient has history of Asthma Denies history of Aspiration, Sleep Apnea Cardiovascular Patient has history of Coronary Artery Disease, Hypertension Denies history of Angina, Arrhythmia Gastrointestinal Denies history of Cirrhosis , Crohn s, Hepatitis A, Hepatitis B, Hepatitis C Endocrine Patient has history of Type II Diabetes Denies history of Type I Diabetes Genitourinary Denies history of End Stage Renal Disease Immunological Patient has history of Lupus Erythematosus - mixed connective tissue Denies history of Raynaud s, Scleroderma Musculoskeletal Patient has history of Gout, Osteoarthritis, Osteomyelitis Denies history of Rheumatoid Arthritis Neurologic Patient has history of Neuropathy Denies history of Dementia, Quadriplegia, Paraplegia, Seizure Disorder Oncologic Denies history of Received Chemotherapy, Received Radiation Psychiatric Denies history of Anorexia/bulimia, Confinement Anxiety Patient is treated with Insulin, Oral Agents. Blood sugar is tested. Weinkauf, Fradel M. ([id number]) Review of Systems (ROS) Constitutional Symptoms (General Health) Denies  complaints or symptoms of Fatigue, Fever, Chills, Marked Weight Change. Eyes Denies complaints or symptoms of Dry Eyes, Vision Changes, Glasses / Contacts. Ear/Nose/Mouth/Throat Denies complaints or symptoms of Difficult clearing ears, Sinusitis. Hematologic/Lymphatic Denies complaints or symptoms of Bleeding / Clotting Disorders, Human Immunodeficiency Virus. Respiratory Complains or has symptoms of Shortness of Breath. Denies complaints or symptoms of Chronic or frequent coughs. Cardiovascular Complains or has symptoms of LE edema. Denies complaints or symptoms of Chest pain. Gastrointestinal Denies complaints or symptoms of Frequent diarrhea, Nausea, Vomiting. Endocrine Complains or has symptoms of Thyroid disease. Denies complaints or  symptoms of Hepatitis, Polydypsia (Excessive Thirst). Genitourinary Denies complaints or symptoms of Kidney failure/ Dialysis, Incontinence/dribbling. Immunological Denies complaints or symptoms of Hives, Itching. Integumentary (Skin) Complains or has symptoms of Wounds. Denies complaints or symptoms of Bleeding or bruising tendency, Breakdown, Swelling. Musculoskeletal Denies complaints or symptoms of Muscle Pain, Muscle Weakness. Neurologic Denies complaints or symptoms of Numbness/parasthesias, Focal/Weakness. Psychiatric Denies complaints or symptoms of Anxiety, Claustrophobia. Objective Constitutional sitting or standing blood pressure is within target range for patient.. pulse regular and within target range for patient.Marland Kitchen respirations regular, non- labored and within target range for patient.Marland Kitchen temperature within target range for patient.. Well-nourished and well-hydrated in no acute distress. Vitals Time Taken: 10:20 AM, Height: 62 in, Source: Stated, Weight: 320 lbs, Source: Stated, BMI: 58.5, Temperature: 98.2 F, Pulse: 86 bpm, Respiratory Rate: 20 breaths/min, Blood Pressure: 115/69 mmHg, Capillary Blood Glucose: 129  mg/dl. Eyes conjunctiva clear no eyelid edema noted. pupils equal round and reactive to light and accommodation. Ears, Nose, Mouth, and Throat no gross abnormality of ear auricles or external auditory canals. normal hearing noted during conversation. mucus membranes moist. Respiratory normal breathing without difficulty. Cardiovascular 2+ dorsalis pedis/posterior tibialis pulses. no clubbing, cyanosis, significant edema, Psychiatric this patient is able to make decisions and demonstrates good insight into disease process. Alert and Oriented x 3. pleasant and cooperative. General Notes: Upon inspection patient's wound bed actually showed signs of fairly good granulation and epithelization at this point. There does not appear to be any evidence of infection which is great news currently I think that is probably attributed to the fact to be honest that the patient has been on oral doxycycline and that likely is cleared up anything that would have been immediately obvious. Nonetheless she does have a wound at the site of noted osteomyelitis which is chronic in nature she also has evidence of a fracture in the proximal 3 to half centimeters of the third metatarsal right around this area is where there is enhancement as well and again that is seem to be consistent potentially with osteomyelitis changes this most likely is probably a pathologic fracture. Nonetheless I do believe based on what I see currently that the patient is get a probably benefit from [place] going ahead and strongly considering hyperbaric oxygen therapy as a means to try to get this wound healed and tried to treat the underlying osteomyelitis completely effectively. Her only other option is probably proceeding with a below-knee amputation which she really wants to try to avoid she tells me. She is seen today with her husband as well in the clinic. Winfield, Yalanda M. ([id number]) Integumentary (Hair, Skin) Wound #1 status is Open.  Original cause of wound was Gradually Appeared. The date acquired was: 01/09/2021. The wound is located on the [place]. The wound measures 1.5cm length x 1.3cm width x 0.3cm depth; 1.532cm^2 area and 0.459cm^3 volume. There is Fat Layer (Subcutaneous Tissue) exposed. There is no tunneling or undermining noted. There is a medium amount of serosanguineous drainage noted. The wound margin is flat and intact. There is large (67-100%) red, pink granulation within the wound bed. There is a small (1-33%) amount of necrotic tissue within the wound bed including Adherent Slough. 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 There was a Chemical/Enzymatic/Mechanical debridement performed by Tommie Sams., PA-C. With the following instrument(s): gauze. Other agent used was saline gauze. A time out was conducted at 11:43, prior to the start of the procedure. There was no bleeding. The procedure was tolerated well. Post Debridement Measurements: 1.5cm length x 1.3cm width x 0.3cm depth; 0.459cm^3 volume. Character of Wound/Ulcer Post Debridement is stable. Severity of Tissue Post Debridement is: Fat layer exposed. Post procedure Diagnosis Wound #1: Same as Pre-Procedure Plan Follow-up Appointments: Return Appointment in 1 week. - [place] office Medications-Please add to medication list.: P.O. Antibiotics - Extend doxycycline The following medication(s) was prescribed: doxycycline hyclate oral 100 mg capsule 1 1 capsule oral taken 2 times per day for 30 days starting 03/29/2021 WOUND #1: - Foot Wound Laterality: Plantar,  Right Cleanser: Byram Ancillary Kit - 15 Day Supply (DME) (Generic) 3 x Per Week/15 Days Discharge Instructions: Use supplies as instructed; Kit contains: (15) Saline Bullets; (15) 3x3 Gauze; 15 pr Gloves Cleanser: Normal Saline 3 x Per Week/15 Days Discharge Instructions: Wash your hands with soap and water. Remove old dressing, discard into plastic bag and place into trash. Cleanse the wound with Normal Saline prior to applying a clean dressing using gauze sponges, not tissues or cotton balls. Do not scrub or use excessive force. Pat dry using gauze sponges, not tissue or cotton balls. Primary Dressing: Hydrofera Blue Ready Transfer Foam, 2.5x2.5 (in/in) (DME) (Generic) 3 x Per Week/15 Days Discharge Instructions: Apply Hydrofera Blue Ready to wound bed as directed Secondary Dressing: Conforming Guaze Roll-Large (DME) (Generic) 3 x Per Week/15 Days Discharge Instructions: Apply Conforming Stretch Guaze to secure Secondary Dressing: Gauze (DME) (Generic) 3 x Per Week/15 Days Discharge Instructions: Apply dry gauze over hydrofera blue to cover Secured With: 61M [place] Surgical Tape, 2x2 (in/yd) (DME) (Generic) 3 x Per Week/15 Days Discharge Instructions: Secure rolled gauze 1. I would recommend currently based on what I am seeing that the ideal thing would be for the patient to likely continue with the doxycycline I [place] for that reason go ahead and send in a refill for 30 days of the doxycycline for her since she has been tolerating this. She is in agreement with that plan. 2. With regard to treatment I am can recommend that we initiate treatment with a Hydrofera Blue dressing I think this is a good option for her. 3. I am also can recommend covering this with roll gauze to secure in place. 4. I am also can recommend that she really should be trying to use something to offload and really minimize her walking for now until we can Gonzalez, Alezandra M. ([id number]) consider the total  contact cast. I think this will be a good option to at least attempt although I believe she would probably be benefited on waiting until we get her to [facility] she is can be transferring there as that is closer for her and I will actually hopefully be seeing her there next week she was scheduled with Dr. Heber Garwin on Tuesday but since I am already familiar with the situation and kind of have a plan formulated I think it may be best for [place] just to go ahead and see about switching her to me. 5. Subsequently as mentioned above we are going to see about transferring her to my care in [place] as well and hopefully next week on Wednesday when she was scheduled for Tuesday we  will make a switch there so I can see her on Wednesday instead. She is in agreement with that plan. I think this will be better for continuity as opposed to someone else having to start back over from scratch especially considering the fact that this is a fairly complex plan that we are looking at even up to and including hyperbaric oxygen therapy. I will see the patient back for follow-up in [place] next week she will not be coming back here to [place]. Electronic Signature(s) Signed: 03/29/2021 5:58:51 PM By: Worthy Keeler PA-C Entered By: Worthy Keeler on 03/29/2021 17:58:51 Poteat, [name] ([id number]) -------------------------------------------------------------------------------- ROS/PFSH Details Patient Name: [name], [name] Date of Service: 03/29/2021 10:15 AM Medical Record Number: [id number] Patient Account Number: [id number] Date of Birth/Sex: 1960-10-01 (60 y.o. F) Treating RN: Dolan Amen Primary Care Provider: [name] Other Clinician: Referring Provider: [name] Treating Provider/Extender: Skipper Cliche in Treatment: 0 Information Obtained From Patient Constitutional Symptoms (General Health) Complaints and Symptoms: Negative for: Fatigue; Fever; Chills; Marked Weight  Change Eyes Complaints and Symptoms: Negative for: Dry Eyes; Vision Changes; Glasses / Contacts Medical History: Negative for: Cataracts; Glaucoma; Optic Neuritis Ear/Nose/Mouth/Throat Complaints and Symptoms: Negative for: Difficult clearing ears; Sinusitis Hematologic/Lymphatic Complaints and Symptoms: Negative for: Bleeding / Clotting Disorders; Human Immunodeficiency Virus Medical History: Positive for: Lymphedema Negative for: Anemia; Hemophilia; Human Immunodeficiency Virus; Sickle Cell Disease Respiratory Complaints and Symptoms: Positive for: Shortness of Breath Negative for: Chronic or frequent coughs Medical History: Positive for: Asthma Negative for: Aspiration; Sleep Apnea Cardiovascular Complaints and Symptoms: Positive for: LE edema Negative for: Chest pain Medical History: Positive for: Coronary Artery Disease; Hypertension Negative for: Angina; Arrhythmia Gastrointestinal Complaints and Symptoms: Negative for: Frequent diarrhea; Nausea; Vomiting Medical History: Negative for: Cirrhosis ; Crohnos; Hepatitis A; Hepatitis B; Hepatitis C Endocrine [name], [name] ([id number]) Complaints and Symptoms: Positive for: Thyroid disease Negative for: Hepatitis; Polydypsia (Excessive Thirst) Medical History: Positive for: Type II Diabetes Negative for: Type I Diabetes Time with diabetes: 20 years Treated with: Insulin, Oral agents Blood sugar tested every day: Yes Tested : Genitourinary Complaints and Symptoms: Negative for: Kidney failure/ Dialysis; Incontinence/dribbling Medical History: Negative for: End Stage Renal Disease Immunological Complaints and Symptoms: Negative for: Hives; Itching Medical History: Positive for: Lupus Erythematosus - mixed connective tissue Negative for: Raynaudos; Scleroderma Integumentary (Skin) Complaints and Symptoms: Positive for: Wounds Negative for: Bleeding or bruising tendency; Breakdown;  Swelling Musculoskeletal Complaints and Symptoms: Negative for: Muscle Pain; Muscle Weakness Medical History: Positive for: Gout; Osteoarthritis; Osteomyelitis Negative for: Rheumatoid Arthritis Neurologic Complaints and Symptoms: Negative for: Numbness/parasthesias; Focal/Weakness Medical History: Positive for: Neuropathy Negative for: Dementia; Quadriplegia; Paraplegia; Seizure Disorder Psychiatric Complaints and Symptoms: Negative for: Anxiety; Claustrophobia Medical History: Negative for: Anorexia/bulimia; Confinement Anxiety Oncologic Medical History: Negative for: Received Chemotherapy; Received Radiation Immunizations Pneumococcal Vaccine: Received Pneumococcal Vaccination: Yes Goettel, Abigaelle M. ([id number]) Implantable Devices No devices added Family and Social History Former smoker; Marital Status - Married; Alcohol Use: Rarely; Drug Use: No History; Caffeine Use: Daily Electronic Signature(s) Signed: 03/29/2021 3:57:53 PM By: Dolan Amen RN Signed: 03/29/2021 6:10:04 PM By: Worthy Keeler PA-C Entered By: Dolan Amen on 03/29/2021 10:36:52 Fisch, Maureen M. ([id number]) -------------------------------------------------------------------------------- SuperBill Details Patient Name: [name], [name] Date of Service: 03/29/2021 Medical Record Number: [id number] Patient Account Number: [id number] Date of Birth/Sex: May 11, 1961 (60 y.o. 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) I10 Essential (primary) hypertension Facility Procedures CPT4 Code: 50016429 Description: 99214 - WOUND CARE  VISIT-LEV 4 EST PT Modifier: Quantity: 1 Physician Procedures CPT4 Code: 0379558 Description: 31674 - WC PHYS LEVEL 4 - EST PT Modifier: Quantity: 1 CPT4 Code: Description: ICD-10 Diagnosis Description E11.621 Type 2 diabetes mellitus with foot ulcer L97.512 Non-pressure chronic ulcer of other part of right foot with fat layer e 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

## 2021-04-05 ENCOUNTER — Encounter (HOSPITAL_BASED_OUTPATIENT_CLINIC_OR_DEPARTMENT_OTHER): Payer: 59 | Admitting: Internal Medicine

## 2021-04-05 ENCOUNTER — Inpatient Hospital Stay (HOSPITAL_BASED_OUTPATIENT_CLINIC_OR_DEPARTMENT_OTHER): Admission: RE | Admit: 2021-04-05 | Payer: 59 | Source: Ambulatory Visit

## 2021-04-06 ENCOUNTER — Encounter (HOSPITAL_BASED_OUTPATIENT_CLINIC_OR_DEPARTMENT_OTHER): Payer: 59 | Attending: Internal Medicine | Admitting: Physician Assistant

## 2021-04-06 ENCOUNTER — Other Ambulatory Visit (HOSPITAL_BASED_OUTPATIENT_CLINIC_OR_DEPARTMENT_OTHER): Payer: Self-pay | Admitting: Physician Assistant

## 2021-04-06 ENCOUNTER — Ambulatory Visit (HOSPITAL_BASED_OUTPATIENT_CLINIC_OR_DEPARTMENT_OTHER)
Admission: RE | Admit: 2021-04-06 | Discharge: 2021-04-06 | Disposition: A | Discharge status: Home / Self Care | Payer: 59 | Source: Ambulatory Visit | Attending: Physician Assistant | Admitting: Physician Assistant

## 2021-04-06 ENCOUNTER — Other Ambulatory Visit: Payer: Self-pay

## 2021-04-06 DIAGNOSIS — T8131XA Disruption of external operation (surgical) wound, not elsewhere classified, initial encounter: Secondary | ICD-10-CM | POA: Diagnosis not present

## 2021-04-06 DIAGNOSIS — E1169 Type 2 diabetes mellitus with other specified complication: Secondary | ICD-10-CM | POA: Insufficient documentation

## 2021-04-06 DIAGNOSIS — Z881 Allergy status to other antibiotic agents status: Secondary | ICD-10-CM | POA: Diagnosis not present

## 2021-04-06 DIAGNOSIS — J45909 Unspecified asthma, uncomplicated: Secondary | ICD-10-CM | POA: Diagnosis not present

## 2021-04-06 DIAGNOSIS — I509 Heart failure, unspecified: Secondary | ICD-10-CM | POA: Insufficient documentation

## 2021-04-06 DIAGNOSIS — Y835 Amputation of limb(s) as the cause of abnormal reaction of the patient, or of later complication, without mention of misadventure at the time of the procedure: Secondary | ICD-10-CM | POA: Diagnosis not present

## 2021-04-06 DIAGNOSIS — I110 Hypertensive heart disease with heart failure: Secondary | ICD-10-CM | POA: Diagnosis not present

## 2021-04-06 DIAGNOSIS — L97509 Non-pressure chronic ulcer of other part of unspecified foot with unspecified severity: Secondary | ICD-10-CM

## 2021-04-06 DIAGNOSIS — E11621 Type 2 diabetes mellitus with foot ulcer: Secondary | ICD-10-CM | POA: Insufficient documentation

## 2021-04-06 DIAGNOSIS — Z6841 Body Mass Index (BMI) 40.0 and over, adult: Secondary | ICD-10-CM | POA: Diagnosis not present

## 2021-04-06 DIAGNOSIS — Z89431 Acquired absence of right foot: Secondary | ICD-10-CM | POA: Diagnosis not present

## 2021-04-06 DIAGNOSIS — Z91010 Allergy to peanuts: Secondary | ICD-10-CM | POA: Insufficient documentation

## 2021-04-06 DIAGNOSIS — E669 Obesity, unspecified: Secondary | ICD-10-CM | POA: Diagnosis not present

## 2021-04-06 DIAGNOSIS — Z888 Allergy status to other drugs, medicaments and biological substances status: Secondary | ICD-10-CM | POA: Diagnosis not present

## 2021-04-06 DIAGNOSIS — Z91013 Allergy to seafood: Secondary | ICD-10-CM | POA: Diagnosis not present

## 2021-04-06 DIAGNOSIS — M869 Osteomyelitis, unspecified: Secondary | ICD-10-CM | POA: Insufficient documentation

## 2021-04-06 DIAGNOSIS — Z87891 Personal history of nicotine dependence: Secondary | ICD-10-CM | POA: Insufficient documentation

## 2021-04-06 DIAGNOSIS — Z89421 Acquired absence of other right toe(s): Secondary | ICD-10-CM | POA: Insufficient documentation

## 2021-04-06 DIAGNOSIS — L97512 Non-pressure chronic ulcer of other part of right foot with fat layer exposed: Secondary | ICD-10-CM | POA: Diagnosis not present

## 2021-04-06 NOTE — Progress Notes (Signed)
Philbert, TRUST CRAGO ([id number]) Visit Report for 04/06/2021 Allergy List Details Patient Name: Date of Service: [place], [name] 04/06/2021 9:00 A M Medical Record Number: [id number] Patient Account Number: [id number] Date of Birth/Sex: Treating RN: May 25, 1961 (60 y.o. Helene Shoe, Tammi Klippel Primary Care Zona Pedro: [name] Other Clinician: Referring Preston Weill: Treating Savilla Turbyfill/Extender: Archie Balboa Weeks in Treatment: 0 Allergies Active Allergies fish oil Fish Containing Products Crestor lovastatin metronidazole Red Yeast Rice ([place] Purpureus) rotigotine Atrovent Suprep Bowel Prep Kit adhesive tape Betadine peanut Severity: Severe Allergy Notes Electronic Signature(s) Signed: 04/06/2021 5:41:26 PM By: Deon Pilling Entered By: Deon Pilling on 04/06/2021 09:01:33 -------------------------------------------------------------------------------- Arrival Information Details Patient Name: Date of Service: Ates, [name] 04/06/2021 9:00 A M Medical Record Number: [id number] Patient Account Number: [id number] Date of Birth/Sex: Treating RN: [birth year] (60 y.o. Helene Shoe, Meta.Reding Primary Care Megin Consalvo: [name] Other Clinician: Referring Sondos Wolfman: Treating Jefrey Raburn/Extender: Debbra Riding in Treatment: 0 Visit Information Patient Arrived: Wheel Chair Arrival Time: 08:57 Accompanied By: husband Transfer Assistance: None Patient Identification Verified: Yes Secondary Verification Process Completed: Yes Patient Requires Transmission-Based Precautions: No Patient Has Alerts: Yes Patient Alerts: [facility] 7/2 ABI: 1.24 History Since Last Visit Added or deleted any medications: No Any new allergies or adverse reactions: No Had a fall or experienced change in Had a fall or experienced change in activities of daily living that may affect risk of falls: No No Signs or symptoms of abuse/neglect since No last  visito Hospitalized since last visit: No Implantable device outside of the clinic 022 cellular tissue based products placed in the center since last visit: No excluding Has Dressing in Place as Prescribed: Yes Has Footwear/Offloading in Place as Prescribed: Yes Left: Surgical Shoe with Pressure Relief Insole Right: Surgical Shoe with Pressure Relief Insole Pain Present Now: No Electronic Signature(s) Signed: 04/06/2021 5:41:26 PM By: Deon Pilling Entered By: Deon Pilling on 04/06/2021 08:59:20 -------------------------------------------------------------------------------- Compression Therapy Details Patient Name: Date of Service: Sorenson, [name] 04/06/2021 9:00 A M Medical Record Number: [id number] Patient Account Number: [id number] Date of Birth/Sex: Treating RN: [birth year] (60 y.o. [name] Primary Care Kawena Lyday: [name] Other Clinician: Referring Stanisha Lorenz: Treating Toccara Alford/Extender: Debbra Riding in Treatment: 0 Compression Therapy Performed for Wound Assessment: NonWound Condition Lymphedema - Right Leg Performed By: Clinician Deon Pilling, RN Compression Type: Three Layer Post Procedure Diagnosis Same as Pre-procedure Electronic Signature(s) Signed: 04/06/2021 5:32:13 PM By: Baruch Gouty RN, BSN Entered By: Baruch Gouty on 04/06/2021 09:34:10 -------------------------------------------------------------------------------- Encounter Discharge Information Details Patient Name: Date of Service: Sheriff, [name] 04/06/2021 9:00 A M Medical Record Number: [id number] Patient Account Number: [id number] Date of Birth/Sex: Treating RN: 18-May-1961 (60 y.o. [name] Primary Care Zeric Baranowski: [name] Other Clinician: Referring Rebel Laughridge: Treating Lynann Demetrius/Extender: Clydie Braun, Archie Patten in Treatment: 0 Encounter Discharge Information Items Discharge Condition: Stable Ambulatory  Status: Wheelchair Discharge Destination: Home Transportation: Private Auto Accompanied By: spouse Schedule Follow-up Appointment: Yes Clinical Summary of Care: Patient Declined Electronic Signature(s) Signed: 04/06/2021 5:32:13 PM By: Baruch Gouty RN, BSN Entered By: Baruch Gouty on 04/06/2021 10:11:40 -------------------------------------------------------------------------------- Lower Extremity Assessment Details Patient Name: Date of Service: Mergen, [name] 04/06/2021 9:00 A M Medical Record Number: [id number] Patient Account Number: [id number] Date of Birth/Sex: Treating RN: September 28, 1960 (60 y.o. [name] Primary Care Nylia Gavina: [name] Other Clinician: Referring Challis Crill: Treating Emilene Roma/Extender: Debbra Riding in Treatment: 0 Edema Assessment Assessed: [Left: No] [  Right: Yes] Edema: [Left: Ye] [Right: s] Calf Left: Right: Point of Measurement: 36 cm From Medial Instep 44 cm Ankle Left: Right: Point of Measurement: 10 cm From Medial Instep 24.5 cm Knee To Floor Left: Right: From Medial Instep 39 cm Vascular Assessment Pulses: Dorsalis Pedis Palpable: [Right:Yes] Posterior Tibial Palpable: [Right:Yes] Notes In [facility] last week 03/29/2021 Right ABI 1.24 Electronic Signature(s) Signed: 04/06/2021 5:41:26 PM By: Deon Pilling Entered By: Deon Pilling on 04/06/2021 09:11:14 -------------------------------------------------------------------------------- Multi-Disciplinary Care Plan Details Patient Name: Date of Service: [place], [name] 04/06/2021 9:00 A M Medical Record Number: [id number] Patient Account Number: [id number] Date of Birth/Sex: Treating RN: [birth year] (60 y.o. [name] Primary Care Etoile Looman: [name] Other Clinician: Referring Alyria Krack: Treating Maryjo Ragon/Extender: Debbra Riding in Treatment: 0 Multidisciplinary Care Plan reviewed  with physician Active Inactive HBO Nursing Diagnoses: Potential for barotraumas to ears, sinuses, teeth, and lungs or cerebral gas embolism related to changes in atmospheric pressure inside hyperbaric oxygen chamber Potential for oxygen toxicity seizures related to delivery of 100% oxygen at an increased atmospheric pressure Potential for pulmonary oxygen toxicity related to delivery of 100% oxygen at an increased atmospheric pressure Goals: Patient will tolerate the hyperbaric oxygen therapy treatment Date Initiated: 04/06/2021 Target Resolution Date: 05/04/2021 Goal Status: Active Patient/caregiver will verbalize understanding of HBO goals, rationale, procedures and potential hazards Date Initiated: 04/06/2021 Target Resolution Date: 05/04/2021 Goal Status: Active Interventions: Administer a five (5) minute air break for patient if signs and symptoms of seizure appear and notify the hyperbaric physician Assess and provide for patients comfort related to the hyperbaric environment and equalization of middle ear Assess for signs and symptoms related to adverse events, including but not limited to confinement anxiety, pneumothorax, oxygen toxicity and baurotrauma Notes: Nutrition Nursing Diagnoses: Impaired glucose control: actual or potential Potential for alteratiion in Nutrition/Potential for imbalanced nutrition Goals: Patient/caregiver will maintain therapeutic glucose control Date Initiated: 04/06/2021 Target Resolution Date: 05/04/2021 Goal Status: Active Interventions: Assess HgA1c results as ordered upon admission and as needed Assess patient nutrition upon admission and as needed per policy Provide education on elevated blood sugars and impact on wound healing Treatment Activities: Patient referred to Primary Care Physician for further nutritional evaluation : 04/06/2021 Notes: Wound/Skin Impairment Nursing Diagnoses: Impaired tissue integrity Knowledge deficit related to  ulceration/compromised skin integrity Goals: Patient/caregiver will verbalize understanding of skin care regimen Date Initiated: 04/06/2021 Target Resolution Date: 05/04/2021 Goal Status: Active Ulcer/skin breakdown will have a volume reduction of 30% by week 4 Date Initiated: 04/06/2021 Target Resolution Date: 05/04/2021 Goal Status: Active Interventions: Assess patient/caregiver ability to obtain necessary supplies Assess patient/caregiver ability to perform ulcer/skin care regimen upon admission and as needed Assess ulceration(s) every visit Provide education on ulcer and skin care Treatment Activities: Skin care regimen initiated : 04/06/2021 Topical wound management initiated : 04/06/2021 Notes: Electronic Signature(s) Signed: 04/06/2021 5:32:13 PM By: Baruch Gouty RN, BSN Signed: 04/06/2021 5:32:13 PM By: Baruch Gouty RN, BSN Entered By: Baruch Gouty on 04/06/2021 09:32:14 -------------------------------------------------------------------------------- Non-Wound Condition Assessment Details Patient Name: Date of Service: Below, [name] 04/06/2021 9:00 A M Medical Record Number: [id number] Patient Account Number: [id number] Date of Birth/Sex: Treating RN: 1961/05/22 (60 y.o. [name] Primary Care Catarina Huntley: [name] Other Clinician: Referring Akyla Vavrek: Treating Jamair Cato/Extender: Debbra Riding in Treatment: 0 Non-Wound Condition: Condition: Lymphedema Location: Leg Side: Left Notes cobblestone appearance to leg. Electronic Signature(s) Signed: 04/06/2021 5:41:26 PM By: Deon Pilling Entered By: Deon Pilling on  04/06/2021 09:27:06 -------------------------------------------------------------------------------- Non-Wound Condition Assessment Details Patient Name: Date of Service: Plouff, [name] 04/06/2021 9:00 A M Medical Record Number: [id number] Patient Account Number: [id number] Date of Birth/Sex: Treating  RN: 10-Feb-1961 (60 y.o. [name] Primary Care Provider: [name] Other Clinician: Referring Provider: Treating Provider/Extender: Debbra Riding in Treatment: 0 Non-Wound Condition: Condition: Lymphedema Location: Leg Side: Right Notes cobblestone appearance to leg with weeping to right dorsal foot. Electronic Signature(s) Signed: 04/06/2021 5:41:26 PM By: Deon Pilling Entered By: Deon Pilling on 04/06/2021 09:27:23 -------------------------------------------------------------------------------- Pain Assessment Details Patient Name: Date of Service: Goldring, [name] 04/06/2021 9:00 A M Medical Record Number: [id number] Patient Account Number: [id number] Date of Birth/Sex: Treating RN: 01-12-1961 (60 y.o. [name] Primary Care Provider: [name] Other Clinician: Referring Provider: Treating Provider/Extender: Debbra Riding in Treatment: 0 Active Problems Location of Pain Severity and Description of Pain Patient Has Paino Yes Site Locations Pain Location: Generalized Pain, Pain in Ulcers Rate the pain. Current Pain Level: 5 Worst Pain Level: 10 Least Pain Level: 0 Tolerable Pain Level: 8 Character of Pain Describe the Pain: Heavy, Sharp Pain Management and Medication Current Pain Management: Medication: No Cold Application: No Rest: No Massage: No Activity: No T.E.N.S.: No Heat Application: No Leg drop or elevation: No Is the Current Pain Management Adequate: Adequate How does your wound impact your activities of daily livingo Sleep: No Bathing: No Appetite: No Relationship With Others: No Bladder Continence: No Emotions: No Bowel Continence: No Work: No Toileting: No Drive: No Dressing: No Hobbies: No Electronic Signature(s) Signed: 04/06/2021 5:41:26 PM By: Deon Pilling Entered By: Deon Pilling on 04/06/2021  09:08:55 -------------------------------------------------------------------------------- Patient/Caregiver Education Details Patient Name: Date of Service: Rizzi, [name] 7/27/2022andnbsp9:00 A M Medical Record Number: [id number] Patient Account Number: [id number] Date of Birth/Gender: Treating RN: [birth year] (60 y.o. [name] Primary Care Physician: [name] Other Clinician: Referring Physician: Treating Physician/Extender: Debbra Riding in Treatment: 0 Education Assessment Education Provided To: Patient Education Topics Provided Elevated Blood Sugar/ Impact on Healing: Methods: Explain/Verbal Responses: Reinforcements needed, State content correctly Hyperbaric Oxygenation: Methods: Explain/Verbal Responses: Reinforcements needed, State content correctly Offloading: Methods: Explain/Verbal Responses: Reinforcements needed, State content correctly Wound/Skin Impairment: Methods: Explain/Verbal Responses: Reinforcements needed, State content correctly Electronic Signature(s) Signed: 04/06/2021 5:32:13 PM By: Baruch Gouty RN, BSN Entered By: Baruch Gouty on 04/06/2021 09:32:46 -------------------------------------------------------------------------------- Wound Assessment Details Patient Name: Date of Service: Grinder, [name] 04/06/2021 9:00 A M Medical Record Number: [id number] Patient Account Number: [id number] Date of Birth/Sex: Treating RN: 04-Jan-1961 (60 y.o. [name] Primary Care Provider: [name] Other Clinician: Referring Provider: Treating Provider/Extender: Debbra Riding in Treatment: 0 Wound Status Wound Number: 8 Primary Diabetic Wound/Ulcer of the Lower Extremity Etiology: Wound Location: Right, Plantar Foot Wound Open Wounding Event: Gradually Appeared Status: Date Acquired: 11/09/2020 Comorbid Cataracts, Lymphedema, Asthma, Congestive Heart  Failure, Weeks Of Treatment: 0 History: Coronary Artery Disease, Hypertension, Type II Diabetes, Clustered Wound: No Rheumatoid Arthritis, Neuropathy Photos Wound Measurements Length: (cm) 1.5 Width: (cm) 1.2 Depth: (cm) 0.2 Area: (cm) 1.414 Volume: (cm) 0.283 % Reduction in Area: 0% % Reduction in Volume: 0% Epithelialization: Small (1-33%) Tunneling: No Undermining: No Wound Description Classification: Grade 3 Wagner Verification: MRI Wound Margin: Distinct, outline attached Exudate Amount: Medium Exudate Type: Serosanguineous Exudate Color: red, brown Foul Odor After Cleansing: No Slough/Fibrino Yes Wound Bed Granulation Amount: Medium (34-66%) Exposed Structure Granulation  Quality: Red, Pink Fascia Exposed: No Necrotic Amount: Medium (34-66%) Fat Layer (Subcutaneous Tissue) Exposed: Yes Necrotic Quality: Adherent Slough Tendon Exposed: No Muscle Exposed: No Joint Exposed: No Bone Exposed: No Assessment Notes callous periwound. Treatment Notes Wound #8 (Foot) Wound Laterality: Plantar, Right Cleanser Peri-Wound Care Sween Lotion (Moisturizing lotion) Discharge Instruction: Apply moisturizing lotion to leg Topical Primary Dressing Hydrofera Blue Ready Foam, 2.5 x2.5 in Discharge Instruction: Apply to wound bed as instructed Secondary Dressing Woven Gauze Sponge, Non-Sterile 4x4 in Discharge Instruction: Apply over primary dressing as directed. ABD Pad, 5x9 Discharge Instruction: Apply over primary dressing as directed. Optifoam Non-Adhesive Dressing, 4x4 in Discharge Instruction: Apply over primary dressing cut to make foam donut Secured With Compression Wrap ThreePress (3 layer compression wrap) Discharge Instruction: Apply three layer compression as directed. Compression Stockings Add-Ons Electronic Signature(s) Signed: 04/06/2021 5:32:13 PM By: Baruch Gouty RN, BSN Signed: 04/06/2021 5:41:26 PM By: Deon Pilling Entered By: Deon Pilling on  04/06/2021 09:26:33 -------------------------------------------------------------------------------- Wound Assessment Details Patient Name: Date of Service: Alyea, [name] 04/06/2021 9:00 A M Medical Record Number: [id number] Patient Account Number: [id number] Date of Birth/Sex: Treating RN: 1961/01/14 (60 y.o. [name] Primary Care Spiros Greenfeld: [name] Other Clinician: Referring Tenzin Edelman: Treating Annita Ratliff/Extender: Debbra Riding in Treatment: 0 Wound Status Wound Number: 9 Primary Open Surgical Wound Etiology: Wound Location: Right Amputation Site - Toe Wound Open Wounding Event: Surgical Injury Status: Date Acquired: 01/30/2020 Date Acquired: 01/30/2020 Comorbid Cataracts, Lymphedema, Asthma, Congestive Heart Failure, Weeks Of Treatment: 0 History: Coronary Artery Disease, Hypertension, Type II Diabetes, Clustered Wound: No Rheumatoid Arthritis, Neuropathy Photos Wound Measurements Length: (cm) 0.6 Width: (cm) 0.7 Depth: (cm) 0.2 Area: (cm) 0.33 Volume: (cm) 0.066 % Reduction in Area: 0% % Reduction in Volume: 0% Epithelialization: Small (1-33%) Tunneling: No Undermining: No Wound Description Classification: Full Thickness Without Exposed Support Structures Wound Margin: Distinct, outline attached Exudate Amount: Medium Exudate Type: Serosanguineous Exudate Color: red, brown Foul Odor After Cleansing: No Slough/Fibrino Yes Wound Bed Granulation Amount: Medium (34-66%) Exposed Structure Granulation Quality: Red, Pink Fascia Exposed: No Necrotic Amount: Medium (34-66%) Fat Layer (Subcutaneous Tissue) Exposed: Yes Necrotic Quality: Adherent Slough Tendon Exposed: No Muscle Exposed: No Joint Exposed: No Bone Exposed: No Treatment Notes Wound #9 (Amputation Site - Toe) Wound Laterality: Right Cleanser Peri-Wound Care Topical Primary Dressing Secondary Dressing Secured With Compression Wrap Compression  Stockings Add-Ons Electronic Signature(s) Signed: 04/06/2021 5:32:13 PM By: Baruch Gouty RN, BSN Signed: 04/06/2021 5:41:26 PM By: Deon Pilling Entered By: Deon Pilling on 04/06/2021 09:25:54 -------------------------------------------------------------------------------- Vitals Details Patient Name: Date of Service: Buswell, [name] 04/06/2021 9:00 A M Medical Record Number: [id number] Patient Account Number: [id number] Date of Birth/Sex: Treating RN: [birth year] (60 y.o. Helene Shoe, Tammi Klippel Primary Care Lavontae Cornia: [name] Other Clinician: Referring Alashia Brownfield: Treating Phallon Haydu/Extender: Debbra Riding in Treatment: 0 Vital Signs Time Taken: 08:50 Temperature (F): 98.1 Height (in): 62 Pulse (bpm): 90 Source: Stated Respiratory Rate (breaths/min): 20 Weight (lbs): 323 Blood Pressure (mmHg): 135/63 Source: Stated Capillary Blood Glucose (mg/dl): 121 Body Mass Index (BMI): 59.1 Reference Range: 80 - 120 mg / dl Electronic Signature(s) Signed: 04/06/2021 5:41:26 PM By: Deon Pilling Entered By: Deon Pilling on 04/06/2021 09:00:00

## 2021-04-06 NOTE — Progress Notes (Signed)
[name] ([id number]) Visit Report for 04/06/2021 Abuse/Suicide Risk Screen Details Patient Name: Date of Service: [name], [name] [name] 04/06/2021 9:00 A M Medical Record Number: [id number] Patient Account Number: [id number] Date of Birth/Sex: Treating RN: 1961/08/09 (60 y.o. [name], [name] Primary Care Kinjal Neitzke: [name] Other Clinician: Referring Kayne Yuhas: Treating Tasia Liz/Extender: Juanna Cao in Treatment: 0 Abuse/Suicide Risk Screen Items Answer ABUSE RISK SCREEN: Has anyone close to you tried to hurt or harm you recentlyo No Do you feel uncomfortable with anyone in your familyo No Has anyone forced you do things that you didnt want to doo No Electronic Signature(s) Signed: 04/06/2021 5:41:26 PM By: Shawn Stall Entered By: Shawn Stall on 04/06/2021 09:06:42 -------------------------------------------------------------------------------- Activities of Daily Living Details Patient Name: Date of Service: [name], [name] [name] 04/06/2021 9:00 A M Medical Record Number: [id number] Patient Account Number: [id number] Date of Birth/Sex: Treating RN: 04-14-1961 (60 y.o. [name], [name] Primary Care Girolamo Lortie: [name] Other Clinician: Referring Nikira Kushnir: Treating Jhordan Mckibben/Extender: Juanna Cao in Treatment: 0 Activities of Daily Living Items Answer Activities of Daily Living (Please select one for each item) Drive Automobile Not Able T Medications ake Completely Able Use T elephone Completely Able Care for Appearance Completely Able Use T oilet Need Assistance Bath / Shower Need Assistance Dress Self Need Assistance Feed Self Completely Able Walk Completely Able Get In / Out Bed Completely Able Housework Need Assistance Prepare Meals Need Assistance Handle Money Need Assistance Shop for Self Need Assistance Electronic Signature(s) Signed: 04/06/2021 5:41:26 PM By: Shawn Stall Entered By:  Shawn Stall on 04/06/2021 09:07:19 -------------------------------------------------------------------------------- Education Screening Details Patient Name: Date of Service: [name], [name] [name] 04/06/2021 9:00 A M Medical Record Number: [id number] Patient Account Number: [id number] Date of Birth/Sex: Treating RN: [birth year] (60 y.o. [name], [name] Primary Care Dave Mergen: [name] Other Clinician: Referring Pranshu Lyster: Treating Mirranda Monrroy/Extender: Juanna Cao in Treatment: 0 Primary Learner Assessed: Patient Learning Preferences/Education Level/Primary Language Learning Preference: Explanation, Demonstration, Printed Material Highest Education Level: College or Above Preferred Language: English Cognitive Barrier Language Barrier: No Translator Needed: No Memory Deficit: No Emotional Barrier: No Cultural/Religious Beliefs Affecting Medical Care: No Physical Barrier Impaired Vision: Yes Glasses Impaired Hearing: No Decreased Hand dexterity: No Knowledge/Comprehension Knowledge Level: High Comprehension Level: High Ability to understand written instructions: High Ability to understand verbal instructions: High Motivation Anxiety Level: Calm Cooperation: Cooperative Education Importance: Acknowledges Need Interest in Health Problems: Asks Questions Perception: Coherent Willingness to Engage in Self-Management High Activities: Readiness to Engage in Self-Management High Activities: Electronic Signature(s) Signed: 04/06/2021 5:41:26 PM By: Shawn Stall Entered By: Shawn Stall on 04/06/2021 09:07:49 -------------------------------------------------------------------------------- Fall Risk Assessment Details Patient Name: Date of Service: [name], [name] [name] 04/06/2021 9:00 A M Medical Record Number: [id number] Patient Account Number: [id number] Date of Birth/Sex: Treating RN: 1960/12/01 (59 y.o. [name], [name] Primary Care  Karelly Dewalt: [name] Other Clinician: Referring Yaquelin Langelier: Treating Renzo Vincelette/Extender: Juanna Cao in Treatment: 0 Fall Risk Assessment Items Have you had 2 or more falls in the last 12 monthso 0 Yes Have you had any fall that resulted in injury in the last 12 monthso 0 No FALLS RISK SCREEN History of falling - immediate or within 3 months 0 No Secondary diagnosis (Do you have 2 or more medical diagnoseso) 0 No Ambulatory aid None/bed rest/wheelchair/nurse 0 Yes Crutches/cane/walker 15 Yes Furniture 0 No Intravenous therapy Access/Saline/Heparin Lock 0 No Gait/Transferring Normal/ bed rest/ wheelchair 0 Yes  Weak (short steps with or without shuffle, stooped but able to lift head while walking, may seek 0 No support from furniture) Impaired (short steps with shuffle, may have difficulty arising from chair, head down, impaired 0 No balance) Mental Status Oriented to own ability 0 Yes Electronic Signature(s) Signed: 04/06/2021 5:41:26 PM By: Shawn Stall Entered By: Shawn Stall on 04/06/2021 09:08:31 -------------------------------------------------------------------------------- Foot Assessment Details Patient Name: Date of Service: [name], [name] [name] 04/06/2021 9:00 A M Medical Record Number: [id number] Patient Account Number: [id number] Date of Birth/Sex: Treating RN: 12/25/1960 (60 y.o. Arta Silence Primary Care Julyssa Kyer: [name] Other Clinician: Referring Amaryllis Malmquist: Treating Larance Ratledge/Extender: Juanna Cao in Treatment: 0 Foot Assessment Items Site Locations + = Sensation present, - = Sensation absent, C = Callus, U = Ulcer R = Redness, W = Warmth, M = Maceration, PU = Pre-ulcerative lesion F = Fissure, S = Swelling, D = Dryness Assessment Right: Left: Other Deformity: No No Prior Foot Ulcer: No No Prior Amputation: Yes No Charcot Joint: No No Ambulatory Status: Ambulatory With  Help Assistance Device: Wheelchair Gait: Steady Electronic Signature(s) Signed: 04/06/2021 5:41:26 PM By: Shawn Stall Entered By: Shawn Stall on 04/06/2021 09:14:08 -------------------------------------------------------------------------------- Nutrition Risk Screening Details Patient Name: Date of Service: [name] 04/06/2021 9:00 A M Medical Record Number: [id number] Patient Account Number: [id number] Date of Birth/Sex: Treating RN: 29-Aug-1961 (60 y.o. [name], [name] Primary Care Cecilio Ohlrich: [name] Other Clinician: Referring Adi Doro: Treating Lashon Hillier/Extender: Juanna Cao in Treatment: 0 Height (in): 62 Weight (lbs): 323 Body Mass Index (BMI): 59.1 Nutrition Risk Screening Items Score Screening NUTRITION RISK SCREEN: I have an illness or condition that made me change the kind and/or amount of food I eat 2 Yes I eat fewer than two meals per day 0 No I eat few fruits and vegetables, or milk products 0 No I have three or more drinks of beer, liquor or wine almost every day 0 No I have tooth or mouth problems that make it hard for me to eat 0 No I don't always have enough money to buy the food I need 0 No I eat alone most of the time 0 No I take three or more different prescribed or over-the-counter drugs a day 1 Yes Without wanting to, I have lost or gained 10 pounds in the last six months 0 No I am not always physically able to shop, cook and/or feed myself 0 No Nutrition Protocols Good Risk Protocol Provide education on elevated blood Moderate Risk Protocol 0 sugars and impact on wound healing, as applicable High Risk Proctocol Risk Level: Moderate Risk Score: 3 Electronic Signature(s) Signed: 04/06/2021 5:41:26 PM By: Shawn Stall Entered By: Shawn Stall on 04/06/2021 09:08:20

## 2021-04-06 NOTE — Progress Notes (Signed)
[name], [name] ([id number]) Visit Report for 04/06/2021 Chief Complaint Document Details Patient Name: Date of Service: [place], [name] 04/06/2021 9:00 A M Medical Record Number: [id number] Patient Account Number: [id number] Date of Birth/Sex: Treating RN: 19-Jun-1961 (60 y.o. [name] Primary Care Provider: [name] Other Clinician: Referring Provider: Treating Provider/Extender: Debbra Riding in Treatment: 0 Information Obtained from: Patient Chief Complaint Right foot ulcer Electronic Signature(s) Signed: 04/06/2021 9:16:59 AM By: Worthy Keeler PA-C Entered By: Worthy Keeler on 04/06/2021 09:16:58 -------------------------------------------------------------------------------- HPI Details Patient Name: Date of Service: [name], [name] M. 04/06/2021 9:00 A M Medical Record Number: [id number] Patient Account Number: [id number] Date of Birth/Sex: Treating RN: 26-Jun-1961 (60 y.o. [name] Primary Care Provider: [name] Other Clinician: Referring Provider: Treating Provider/Extender: Debbra Riding in Treatment: 0 History of Present Illness HPI Description: 07/23/2019 on evaluation today patient presents with a myriad of wounds noted at multiple locations over her right heel, right lower extremity, [name] abdominal region. She also has bilateral lower extremity lymphedema which is quite significant as well. Incidentally she also has congestive heart failure [name] hypertension. She also is obese. With that being said I think all this is contributing as well to her lower extremity edema which is very much uncontrolled. It seems like its been at least several months since she is worn any compression according to what she tells me. With that being said I am not sure exactly when that would have been. She does have fibrotic changes in the lower extremity secondary to lymphedema worse on the left than  the right. She did show me pictures of wound she had on the anterior portion of her shin which was quite significant fortunately that has healed. These issues have been intermittent at this time. Again I think that with appropriate compression therapy she may actually be doing better than what we are seeing at this point but again I am not really sure that she is ever been extremely compliant with that. Fortunately there is no signs of active infection at this time. No fever chills noted. As far as the abdominal ulcer she initially had one wound that she thinks may have been a bug bite. Subsequently she was put a dressing on this [name] she states that the tape pulled skin off on other locations causing other wounds that have not healed that is been about 3 months. The wounds on her lower extremities have been intermittent over 3 years. This includes the heel. 11/19; this is a patient that I have not seen previously. She was admitted to our clinic last week with multiple wounds including several superficial circular areas on her abdomen predominantly right upper quadrant. Also 1 in her umbilicus. She has an area on her right lateral malleolus which was new today. She has bilateral lower extremity edema with very significant stasis dermatitis on the left anterior tibial area. She has tightly adherent skin in her lower extremities probably secondary to cutaneous fibrosis in the area. We put her in compression last week. She comes in with the dressings reasonably saturated. 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 She has multiple areas on her arms from scratching. 12/3; the patient I saw for the first time 2 weeks ago. She has 3 small circular areas on her abdomen  2 in the right upper quadrant one on her  umbilicus. We have been using silver alginate to this area. She also had an area on her right lateral malleolus [name] right heel [name] right medial malleolus. We have been using silver alginate here under compression. She does not have an open area on the left leg today. We are going to order her stockings for the left leg. She clearly has chronic lymphedema in these areas. X-ray of the foot from 10/20 did not show any fracture or dislocation. The heel wound was noted there was no evidence of osteomyelitis She complains of generalized pruritus. 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 She has longstanding lymphedema. Although she does not have known liver disease I looked at his CT scan of the abdomen from February of this year that showed hepatic steatosis 12/11; patient still has no open area on the left leg we transitioned her into a stocking she had. Her stockings from  are supposed to be arriving later today which will be the stockings of choice. The only wound remaining on the right is the right heel 2 small superficial open areas. Everything else is well on its way to healing in the right leg few small excoriations. She has 1 major area remaining on her abdomen the rest seem to be healing 12/22 patient still has no open area on the left leg [name] she is still in a stocking. She had still 2 open areas on the tip of her right heel. These look like pressure related areas although the patient is not really certain how this is happening. She has an open heeled shoe that we provided. Still has 1 open area on the right lateral abdomen The patient has complaints of generalized pruritus. She has multiple excoriated areas on her abdomen that of close over her arms or thighs which I think are from scratching. She tells me that she has never had a basic work-up for this which might include basic lab work, liver functions, kidney functions, thyroid etc. She might  also benefit from a dermatologist 12/29; the patient has a deeper larger more painful wound on the tip of her right heel. Again these look like pressure ulcers although she wears an open toed heel pads or heel at night to prevent it from hitting the mattress etc. They tell me [name] remind me that this is been present on [name] off for about 2 years that has closed over but then will reopen. I did look over her lab work that was available in [place]. She does not have elevated liver function tests or creatinine. I was not able to see a TSH. This was in response to her complaints of generalized itching [name] a itch/scratch cycle. I also noted that she is on OxyContin [name] oxycodone [name] of course narcotics can cause generalized pruritus as a side effect Readmission: History From [place], [place] Last Week: 03/29/2021 this is a patient who presents for initial evaluation here in the clinic though I have seen her 1 time previously in 2020 this was in [place]. That was actually her initial visit therefore a heel ulceration. Subsequently that did not healing she actually saw me the 1 time in November [name] then subsequently saw Dr. Dellia Nims through the end of December where she apparently was healed. 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 This is on the right. With that being said right now she has a basically region on the lateral portion of the ray site where she is applying more pressure especially as her foot seems to be starting to turn in [name] this has become an increasingly significant issue for her to be honest. She does see Dr. Sharol Given [name] Dr. Sharol Given has had her on doxycycline for quite a bit of time here. Subsequently she is just now wrapping that out. I think that she probably would be benefited by continue the doxycycline for a time while we can work through what we need to do with things here.  She does have evidence of osteomyelitis based on what I saw on the MRI today. This obviously is unfortunate [name] definitely not something that she was hoping to hear. With that being said I do believe that she would potentially be a strong candidate for hyperbaric oxygen therapy. Also believe she could be a candidate for total contact cast though we have to be cautious due to the fact that how her foot is bending inward I think that this is good to be a little bit of a concern. We will definitely have to keeping a close eye on things. She does have a history of diabetes mellitus type 2. She also other than the amputation has a medical history positive for hypertension. She has never undergone hyperbaric oxygen therapy previously I did show her the chamber we did talk a little bit about it today as well. Admission [facility]: o 04/06/2021 Patient presents here in [place] today for evaluation. Again she is establishing care here after I saw her last week in [place]. Obviously I think that she is doing extremely well at this point which is great news [name] in general I am extremely pleased with where things stand from the standpoint of the appearance of the wound. Obviously this is not significantly smaller but does look a lot cleaner I think using the Hydrofera Blue has been of benefit over the past week. Nonetheless she still has a wound with issues with pressure here which I think is good to be an ongoing issue. Also think that the osteomyelitis which is chronic is also getting ongoing issue. She does want to try to do what she can to prevent this from worsening [name] ending with a below-knee amputation. T that end I do think that getting her into the hyperbaric oxygen chamber would be what we need to do. She has recently been seen by cardiology o [name] subsequently well as far as that is concerned. Her ejection fraction was appropriate for hyperbarics [name] everything seems to be doing great in  that regard. She has not had a recent chest x-ray she is a former smoker around 15 years ago she quit. Nonetheless I do believe with her asthma we do want to do a chest x-ray just to make sure everything is okay before proceeding with the hyperbarics although we can go ahead [name] see about getting the approval. I also think she is going require some sharp debridement [name] around this wound we also have to contact her insurance for prior approval on this as well. Electronic Signature(s) Signed: 04/06/2021 2:40:01 PM By: Worthy Keeler PA-C Entered By: Worthy Keeler on 04/06/2021 14:40:00 -------------------------------------------------------------------------------- Physical Exam Details Patient Name: Date of Service: [name], [name] M. 04/06/2021 9:00 A M Medical Record Number: [id number] Patient Account Number: [id number] Date of Birth/Sex: Treating RN: 22-Aug-1961 (60 y.o. F)  Baruch Gouty Primary Care Provider: [name] Other Clinician: Referring Provider: Treating Provider/Extender: Debbra Riding in Treatment: 0 Constitutional sitting or standing blood pressure is within target range for patient.. pulse regular [name] within target range for patient.Marland Kitchen respirations regular, non-labored [name] within target range for patient.Marland Kitchen temperature within target range for patient.. Obese [name] well-hydrated in no acute distress. Eyes conjunctiva clear no eyelid edema noted. pupils equal round [name] reactive to light [name] accommodation. Ears, Nose, Mouth, [name] Throat no gross abnormality of ear auricles or external auditory canals. normal hearing noted during conversation. mucus membranes moist. Respiratory normal breathing without difficulty. Cardiovascular 2+ dorsalis pedis/posterior tibialis pulses. no clubbing, cyanosis, significant edema, <3 sec cap refill. Musculoskeletal normal gait [name] posture. no significant deformity or arthritic changes, no loss or range of  motion, no clubbing. Psychiatric this patient is able to make decisions [name] demonstrates good insight into disease process. Alert [name] Oriented x 3. pleasant [name] cooperative. Notes Upon inspection patient's wound bed actually showed some signs of decent granulation there was some callus around the edges of the wound that could require sharp debridement to clear this away [name] improve the overall appearance but again have to wait until we obtain approval from insurance to be able to proceed in this regard. We will work on getting that over the next week or 2. Subsequently were also can to see about getting approval for the hyperbaric oxygen therapy which I think would also be of great benefit for the patient. She is in agreement with this plan. Electronic Signature(s) Signed: 04/06/2021 2:40:40 PM By: Worthy Keeler PA-C Entered By: Worthy Keeler on 04/06/2021 14:40:40 -------------------------------------------------------------------------------- Physician Orders Details Patient Name: Date of Service: Oertel, [name] M. 04/06/2021 9:00 A M Medical Record Number: [id number] Patient Account Number: [id number] Date of Birth/Sex: Treating RN: 03/23/1961 (60 y.o. [name] Primary Care Provider: [name] Other Clinician: Referring Provider: Treating Provider/Extender: Debbra Riding in Treatment: 0 Verbal / Phone Orders: No Diagnosis Coding ICD-10 Coding Code Description E11.621 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 T81.31XA Disruption of external operation (surgical) wound, not elsewhere classified, initial encounter 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) I10 Essential (primary) hypertension Follow-up Appointments Return Appointment in 1 week. Bathing/ Shower/ Hygiene May shower with protection but do not get wound dressing(s) wet. Edema Control -  Lymphedema / SCD / Other Bilateral Lower Extremities Elevate legs to the level of the heart or above for 30 minutes daily [name]/or when sitting, a frequency of: Avoid standing for long periods of time. Patient to wear own compression stockings every day. 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 Follow Hyperbaric Oxygen Glycemia Protocol A frin (Oxymetazoline HCL) 0.05% nasal spray - 1 spray in both nostrils daily as needed prior to HBO treatment for difficulty clearing ears Wound Treatment Wound #8 - Foot Wound Laterality: Plantar, Right Peri-Wound Care: Sween Lotion (Moisturizing lotion) 1 x Per Week/30 Days Discharge Instructions: Apply moisturizing lotion to leg Prim Dressing: Hydrofera Blue Ready Foam, 2.5 x2.5 in 1 x  Per Week/30 Days ary Discharge Instructions: Apply to wound bed as instructed Secondary Dressing: Woven Gauze Sponge, Non-Sterile 4x4 in 1 x Per Week/30 Days Discharge Instructions: Apply over primary dressing as directed. Secondary Dressing: ABD Pad, 5x9 1 x Per Week/30 Days Discharge Instructions: Apply over primary dressing as directed. Secondary Dressing: Optifoam Non-Adhesive Dressing, 4x4 in 1 x Per Week/30 Days Discharge Instructions: Apply over primary dressing cut to make foam donut Compression Wrap: ThreePress (3 layer compression wrap) 1 x Per Week/30 Days Discharge Instructions: Apply three layer compression as directed. Radiology X-ray, Chest 2 view - pre hyberbaric oxygen therapy CPT - (ICD10 E11.621 - Type 2 diabetes mellitus with foot ulcer) GLYCEMIA INTERVENTIONS  PROTOCOL PRE-HBO GLYCEMIA INTERVENTIONS ACTION INTERVENTION Obtain pre-HBO capillary blood glucose (ensure 1 physician order is in chart). A. Notify HBO physician [name] await physician orders. 2 If result is 70 mg/dl or below: B. If the result meets the hospital definition of a critical result, follow hospital policy. A. Give patient an 8 ounce Glucerna Shake, an 8 ounce Ensure, or 8 ounces of a Glucerna/Ensure equivalent dietary supplement*. B. Wait 30 minutes. If result is 71 mg/dl to 130 mg/dl: C. Retest patients capillary blood glucose (CBG). D. If result greater than or equal to 110 mg/dl, proceed with HBO. If result less than 110 mg/dl, notify HBO physician [name] consider holding HBO. If result is 131 mg/dl to 249 mg/dl: A. Proceed with HBO. A. Notify HBO physician [name] await physician orders. B. It is recommended to hold HBO and do If result is 250 mg/dl or greater: blood/urine ketone testing. C. If the result meets the hospital definition of a critical result, follow hospital policy. POST-HBO GLYCEMIA INTERVENTIONS ACTION INTERVENTION Obtain post HBO capillary blood glucose (ensure 1 physician order is in chart). A. Notify HBO physician [name] await physician orders. 2 If result is 70 mg/dl or below: B. If the result meets the hospital definition of a critical result, follow hospital policy. A. Give patient an 8 ounce Glucerna Shake, an 8 ounce Ensure, or 8 ounces of a Glucerna/Ensure equivalent dietary supplement*. B. Wait 15 minutes for symptoms of If result is 71 mg/dl to 100 mg/dl: hypoglycemia (i.e. nervousness, anxiety, sweating, chills, clamminess, irritability, confusion, tachycardia or dizziness). C. If patient asymptomatic, discharge patient. If patient symptomatic, repeat capillary blood glucose (CBG) [name] notify HBO physician. If result is 101 mg/dl to 249 mg/dl: A. Discharge patient. A. Notify HBO physician [name] await physician orders. B. It is  recommended to do blood/urine ketone If result is 250 mg/dl or greater: testing. C. If the result meets the hospital definition of a critical result, follow hospital policy. *Juice or candies are NOT equivalent products. If patient refuses the Glucerna or Ensure, please consult the hospital dietitian for an appropriate substitute. Electronic Signature(s) Signed: 04/06/2021 4:30:28 PM By: Worthy Keeler PA-C Signed: 04/06/2021 5:32:13 PM By: Baruch Gouty RN, BSN Entered By: Baruch Gouty on 04/06/2021 09:50:55 Prescription 04/06/2021 -------------------------------------------------------------------------------- Jezewski, Orbie Pyo PA Patient Name: Provider: July 28, 1961 [id number] Date of Birth: NPI#[id number] Sex: DEA #: [phone] Phone #: License #: [facility] Patient Address: [address] [address] , [place],  [address] [phone] Allergies peanut; fish oil; Fish Containing Products; Crestor; lovastatin; metronidazole; Red Yeast Rice ([place] Purpureus); rotigotine; Atrovent; Suprep Bowel Prep Kit; adhesive tape; Betadine Provider's Orders X-ray, Chest 2 view - ICD10: E11.621 - pre hyberbaric oxygen  therapy CPT Hand Signature: Date(s): Electronic Signature(s) Signed: 04/06/2021 4:30:28 PM By: Worthy Keeler PA-C Signed: 04/06/2021 5:32:13 PM By: Baruch Gouty RN, BSN Entered By: Baruch Gouty on 04/06/2021 09:50:56 -------------------------------------------------------------------------------- Problem List Details Patient Name: Date of Service: [place]iner, [name] M. 04/06/2021 9:00 A M Medical Record Number: [id number] Patient Account Number: [id number] Date of Birth/Sex: Treating RN: [birth year] (60 y.o. [name] Primary Care Provider: [name] Other Clinician: Referring Provider: Treating Provider/Extender: Debbra Riding in Treatment: 0 Active Problems ICD-10 Encounter Code Description Active Date MDM Diagnosis E11.621 Type 2 diabetes mellitus with foot ulcer 04/06/2021 No Yes L97.512 Non-pressure chronic ulcer of other part of right foot with fat layer exposed 04/06/2021 No Yes T81.31XA Disruption of external operation (surgical) wound, not elsewhere classified, 04/06/2021 No Yes initial encounter 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) 04/06/2021 No Yes I10 Essential (primary) hypertension 04/06/2021 No Yes Inactive Problems Resolved Problems Electronic Signature(s) Signed: 04/06/2021 9:26:17 AM By: Worthy Keeler PA-C Previous Signature: 04/06/2021 9:16:42 AM Version By: Worthy Keeler PA-C Entered By: Worthy Keeler on 04/06/2021 09:26:17 -------------------------------------------------------------------------------- Progress Note Details Patient Name: Date of Service: [name], [name] M. 04/06/2021 9:00 A M Medical Record Number: [id number] Patient Account Number: [id number] Date of Birth/Sex: Treating RN: 1960-12-27 (60 y.o. [name] Primary Care Provider: [name] Other Clinician: Referring Provider: Treating Provider/Extender: Debbra Riding in Treatment: 0 Subjective Chief Complaint Information obtained from Patient Right foot ulcer History of Present Illness (HPI) 07/23/2019 on evaluation today patient presents with a myriad of wounds noted at multiple locations over her right heel, right lower extremity, [name] abdominal region. She also has bilateral lower extremity lymphedema which is quite significant as well. Incidentally she also has congestive heart failure [name] hypertension. She also is obese. With that being said I think all this is contributing as well to her lower extremity edema which is very much uncontrolled. It seems like its been at least several months since she is worn any compression according  to what she tells me. With that being said I am not sure exactly when that would have been. She does have fibrotic changes in the lower extremity secondary to lymphedema worse on the left than the right. She did show me pictures of wound she had on the anterior portion of her shin which was quite significant fortunately that has healed. These issues have been intermittent at this time. Again I think that with appropriate compression therapy she may actually be doing better than what we are seeing at this point but again I am not really sure that she is ever been extremely compliant with that. Fortunately there is no signs of active infection at this time. No fever chills noted. As far as the abdominal ulcer she initially had one wound that she thinks may have been a bug bite. Subsequently she was put a dressing on this [name] she states that the tape pulled skin off on other locations causing other wounds that have not healed that is been about 3 months. The wounds on her lower extremities have been intermittent over 3 years. This includes the heel. 11/19; this is a patient that I have not seen previously. She was admitted to our clinic last week with multiple wounds including several superficial circular areas on her abdomen predominantly right upper quadrant. Also 1 in her umbilicus. She has an area on her right lateral malleolus which was new today. She has  bilateral lower extremity edema with very significant stasis dermatitis on the left anterior tibial area. She has tightly adherent skin in her lower extremities probably secondary to cutaneous fibrosis in the area. We put her in compression last week. She comes in with the dressings reasonably saturated. 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 She has  multiple areas on her arms from scratching. 12/3; the patient I saw for the first time 2 weeks ago. She has 3 small circular areas on her abdomen 2 in the right upper quadrant one on her umbilicus. We have been using silver alginate to this area. She also had an area on her right lateral malleolus [name] right heel [name] right medial malleolus. We have been using silver alginate here under compression. She does not have an open area on the left leg today. We are going to order her stockings for the left leg. She clearly has chronic lymphedema in these areas. X-ray of the foot from 10/20 did not show any fracture or dislocation. The heel wound was noted there was no evidence of osteomyelitis She complains of generalized pruritus. 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 She has longstanding lymphedema. Although she does not have known liver disease I looked at his CT scan of the abdomen from February of this year that showed hepatic steatosis 12/11; patient still has no open area on the left leg we transitioned her into a stocking she had. Her stockings from [employer] are supposed to be arriving later today which will be the stockings of choice. The only wound remaining on the right is the right heel 2 small superficial open areas. Everything else is well on its way to healing in the right leg few small excoriations. She has 1 major area remaining on her abdomen the rest seem to be healing 12/22 patient still has no open area on the left leg [name] she is still in a stocking. She had still 2 open areas on the tip of her right heel. These look like pressure related areas although the patient is not really certain how this is happening. She has an open heeled shoe that we provided. Still has 1 open area on the right lateral abdomen The patient has complaints of generalized pruritus. She has multiple excoriated areas on her abdomen that of close over her arms or thighs which I  think are from scratching. She tells me that she has never had a basic work-up for this which might include basic lab work, liver functions, kidney functions, thyroid etc. She might also benefit from a dermatologist 12/29; the patient has a deeper larger more painful wound on the tip of her right heel. Again these look like pressure ulcers although she wears an open toed heel pads or heel at night to prevent it from hitting the mattress etc. They tell me [name] remind me that this is been present on [name] off for about 2 years that has closed over but then will reopen. I did look over her lab work that was available in [place]. She does not have elevated liver function tests or creatinine. I was not able to see a TSH. This was in response to her complaints of generalized itching [name] a itch/scratch cycle. I also noted that she is on OxyContin [name] oxycodone [name] of course  narcotics can cause generalized pruritus as a side effect Readmission: History From [place], [place] Last Week: 03/29/2021 this is a patient who presents for initial evaluation here in the clinic though I have seen her 1 time previously in 2020 this was in [place]. That was actually her initial visit therefore a heel ulceration. Subsequently that did not healing she actually saw me the 1 time in November [name] then subsequently saw Dr. Dellia Nims through the end of December where she apparently was healed. Since I last seen her she actually did have an amputation which is basically a fourth [name] fifth ray amputation of the foot which is in question today. This is on the right. With that being said right now she has a basically region on the lateral portion of the ray site where she is applying more pressure especially as her foot seems to be starting to turn in [name] this has become an increasingly significant issue for her to be honest. She does see Dr. Sharol Given [name] Dr. Sharol Given has had her on doxycycline for quite a bit of time here. Subsequently  she is just now wrapping that out. I think that she probably would be benefited by continue the doxycycline for a time while we can work through what we need to do with things here. She does have evidence of osteomyelitis based on what I saw on the MRI today. This obviously is unfortunate [name] definitely not something that she was hoping to hear. With that being said I do believe that she would potentially be a strong candidate for hyperbaric oxygen therapy. Also believe she could be a candidate for total contact cast though we have to be cautious due to the fact that how her foot is bending inward I think that this is good to be a little bit of a concern. We will definitely have to keeping a close eye on things. She does have a history of diabetes mellitus type 2. She also other than the amputation has a medical history positive for hypertension. She has never undergone hyperbaric oxygen therapy previously I did show her the chamber we did talk a little bit about it today as well. Admission [facility]: o 04/06/2021 Patient presents here in [place] today for evaluation. Again she is establishing care here after I saw her last week in [place]. Obviously I think that she is doing extremely well at this point which is great news [name] in general I am extremely pleased with where things stand from the standpoint of the appearance of the wound. Obviously this is not significantly smaller but does look a lot cleaner I think using the Hydrofera Blue has been of benefit over the past week. Nonetheless she still has a wound with issues with pressure here which I think is good to be an ongoing issue. Also think that the osteomyelitis which is chronic is also getting ongoing issue. She does want to try to do what she can to prevent this from worsening [name] ending with a below-knee amputation. T that end I do think that getting her into the hyperbaric oxygen chamber would be what we need to do. She has  recently been seen by cardiology o [name] subsequently well as far as that is concerned. Her ejection fraction was appropriate for hyperbarics [name] everything seems to be doing great in that regard. She has not had a recent chest x-ray she is a former smoker around 15 years ago she quit. Nonetheless I do believe with her asthma we  do want to do a chest x-ray just to make sure everything is okay before proceeding with the hyperbarics although we can go ahead [name] see about getting the approval. I also think she is going require some sharp debridement [name] around this wound we also have to contact her insurance for prior approval on this as well. Patient History Information obtained from Patient. Allergies fish oil, Fish Containing Products, Crestor, lovastatin, metronidazole, Red Yeast Rice (Monascus Purpureus), rotigotine, Atrovent, Suprep Bowel Prep Kit, adhesive tape, Betadine, peanut (Severity: Severe) Family History Diabetes - Mother, Heart Disease - Mother,Father,Siblings, Hypertension - Mother,Father,Siblings, Lung Disease - Father, Stroke - Mother, No family history of Cancer, Hereditary Spherocytosis, Kidney Disease, Seizures, Thyroid Problems, Tuberculosis. Social History Former smoker - quit 15 years ago, Marital Status - Married, Alcohol Use - Never, Drug Use - No History, Caffeine Use - Rarely. Medical History Eyes Patient has history of Cataracts - both eyes Denies history of Glaucoma, Optic Neuritis Ear/Nose/Mouth/Throat Denies history of Chronic sinus problems/congestion, Middle ear problems Hematologic/Lymphatic Patient has history of Lymphedema Denies history of Anemia, Hemophilia, Human Immunodeficiency Virus, Sickle Cell Disease Respiratory Patient has history of Asthma Denies history of Aspiration, Chronic Obstructive Pulmonary Disease (COPD), Pneumothorax, Sleep Apnea, Tuberculosis Cardiovascular Patient has history of Congestive Heart Failure, Coronary Artery Disease,  Hypertension Denies history of Angina, Arrhythmia, Hypotension, Myocardial Infarction, Peripheral Arterial Disease, Peripheral Venous Disease, Phlebitis, Vasculitis Endocrine Patient has history of Type II Diabetes Genitourinary Denies history of End Stage Renal Disease Immunological Denies history of Lupus Erythematosus, Raynaudoos, Scleroderma Integumentary (Skin) Denies history of History of Burn Musculoskeletal Patient has history of Rheumatoid Arthritis Denies history of Gout, Osteoarthritis, Osteomyelitis Neurologic Patient has history of Neuropathy Denies history of Dementia, Quadriplegia, Paraplegia, Seizure Disorder Hospitalization/Surgery History - 4 [name] 5th toe right foot amputations Dr. Sharol Given 01/2020. Medical A Surgical History Notes nd Constitutional Symptoms (General Health) hypothyroidism Cardiovascular cardiac stents Gastrointestinal GERD Endocrine Hypothyroidism Immunological Mix connective tissue disease- autoimmune Musculoskeletal Fibromyalgia, Scoliosis, Review of Systems (ROS) Constitutional Symptoms (General Health) Denies complaints or symptoms of Fatigue, Fever, Chills, Marked Weight Change. Eyes Denies complaints or symptoms of Vision Changes, Glasses / Contacts. Ear/Nose/Mouth/Throat Denies complaints or symptoms of Chronic sinus problems or rhinitis. Respiratory Denies complaints or symptoms of Chronic or frequent coughs, Shortness of Breath. Genitourinary Denies complaints or symptoms of Frequent urination. Integumentary (Skin) Complains or has symptoms of Wounds - currently. Musculoskeletal Denies complaints or symptoms of Muscle Pain, Muscle Weakness. Objective Constitutional sitting or standing blood pressure is within target range for patient.. pulse regular [name] within target range for patient.Marland Kitchen respirations regular, non-labored [name] within target range for patient.Marland Kitchen temperature within target range for patient.. Obese [name] well-hydrated in  no acute distress. Vitals Time Taken: 8:50 AM, Height: 62 in, Source: Stated, Weight: 323 lbs, Source: Stated, BMI: 59.1, Temperature: 98.1 F, Pulse: 90 bpm, Respiratory Rate: 20 breaths/min, Blood Pressure: 135/63 mmHg, Capillary Blood Glucose: 121 mg/dl. Eyes conjunctiva clear no eyelid edema noted. pupils equal round [name] reactive to light [name] accommodation. Ears, Nose, Mouth, [name] Throat no gross abnormality of ear auricles or external auditory canals. normal hearing noted during conversation. mucus membranes moist. Respiratory normal breathing without difficulty. Cardiovascular 2+ dorsalis pedis/posterior tibialis pulses. no clubbing, cyanosis, significant edema, Musculoskeletal normal gait [name] posture. no significant deformity or arthritic changes, no loss or range of motion, no clubbing. Psychiatric this patient is able to make decisions [name] demonstrates good insight into disease process. Alert [name] Oriented x 3. pleasant [name] cooperative. General Notes:  Upon inspection patient's wound bed actually showed some signs of decent granulation there was some callus around the edges of the wound that could require sharp debridement to clear this away [name] improve the overall appearance but again have to wait until we obtain approval from insurance to be able to proceed in this regard. We will work on getting that over the next week or 2. Subsequently were also can to see about getting approval for the hyperbaric oxygen therapy which I think would also be of great benefit for the patient. She is in agreement with this plan. Integumentary (Hair, Skin) Wound #8 status is Open. Original cause of wound was Gradually Appeared. The date acquired was: 11/09/2020. The wound is located on the [place]. The wound measures 1.5cm length x 1.2cm width x 0.2cm depth; 1.414cm^2 area [name] 0.283cm^3 volume. There is Fat Layer (Subcutaneous Tissue) exposed. There is no tunneling or undermining noted. There is  a medium amount of serosanguineous drainage noted. The wound margin is distinct with the outline attached to the wound base. There is medium (34-66%) red, pink granulation within the wound bed. There is a medium (34-66%) amount of necrotic tissue within the wound bed including Adherent Slough. General Notes: callous periwound. Wound #9 status is Open. Original cause of wound was Surgical Injury. The date acquired was: 01/30/2020. The wound is located on the Right Amputation Site - T The wound measures 0.6cm length x 0.7cm width x 0.2cm depth; 0.33cm^2 area [name] 0.066cm^3 volume. There is Fat Layer (Subcutaneous Tissue) oe. exposed. There is no tunneling or undermining noted. There is a medium amount of serosanguineous drainage noted. The wound margin is distinct with the outline attached to the wound base. There is medium (34-66%) red, pink granulation within the wound bed. There is a medium (34-66%) amount of necrotic tissue within the wound bed including Adherent Slough. Other Condition(s) Patient presents with Lymphedema located on the Left Leg. General Notes: cobblestone appearance to leg. Patient presents with Lymphedema located on the Right Leg. General Notes: cobblestone appearance to leg with weeping to right dorsal foot. Assessment Active Problems ICD-10 Type 2 diabetes mellitus with foot ulcer Non-pressure chronic ulcer of other part of right foot with fat layer exposed Disruption of external operation (surgical) wound, not elsewhere classified, initial encounter Acquired absence of other right toe(s) Essential (primary) hypertension Procedures There was a Three Layer Compression Therapy Procedure by Deon Pilling, RN. Post procedure Diagnosis Wound #: Same as Pre-Procedure Plan Follow-up Appointments: Return Appointment in 1 week. Bathing/ Shower/ Hygiene: May shower with protection but do not get wound dressing(s) wet. Edema Control - Lymphedema / SCD / Other: Elevate legs  to the level of the heart or above for 30 minutes daily [name]/or when sitting, a frequency of: Avoid standing for long periods of time. Patient to wear own compression stockings every day. - left leg daily Exercise regularly Hyperbaric Oxygen Therapy: Indication: - wagner grade 3 diabetic foot ulcer right foot If appropriate for treatment, begin HBOT per protocol: 2.0 ATA for 90 Minutes without Air Breaks T Number of Treatments: - 40 otal One treatments per day (delivered Monday through Friday unless otherwise specified in Special Instructions below): Finger stick Blood Glucose Pre- [name] Post- HBOT Treatment. Follow Hyperbaric Oxygen Glycemia Protocol Afrin (Oxymetazoline HCL) 0.05% nasal spray - 1 spray in both nostrils daily as needed prior to HBO treatment for difficulty clearing ears Radiology ordered were: X-ray, Chest 2 view - pre hyberbaric oxygen therapy CPT WOUND #8: - Foot  Wound Laterality: Plantar, Right Peri-Wound Care: Sween Lotion (Moisturizing lotion) 1 x Per Week/30 Days Discharge Instructions: Apply moisturizing lotion to leg Prim Dressing: Hydrofera Blue Ready Foam, 2.5 x2.5 in 1 x Per Week/30 Days ary Discharge Instructions: Apply to wound bed as instructed Secondary Dressing: Woven Gauze Sponge, Non-Sterile 4x4 in 1 x Per Week/30 Days Discharge Instructions: Apply over primary dressing as directed. Secondary Dressing: ABD Pad, 5x9 1 x Per Week/30 Days Discharge Instructions: Apply over primary dressing as directed. Secondary Dressing: Optifoam Non-Adhesive Dressing, 4x4 in 1 x Per Week/30 Days Discharge Instructions: Apply over primary dressing cut to make foam donut Com pression Wrap: ThreePress (3 layer compression wrap) 1 x Per Week/30 Days Discharge Instructions: Apply three layer compression as directed. 1. Would recommend currently that we going continue with the wound care measures as before [name] the patient is in agreement with plan. This includes the use of the  [name] which I do believe has been beneficial to her up to this point. 2. I am also going to recommend that we have the patient continue with the offloading shoe to try to keep pressure under control here. 3. [name] also can recommend that we continue with the ABD pad to secure everything in place for some additional padding followed by the 3 layer compression wrap which I believe will help with her swelling as well. I think this is good to control it much better than try to use a compression sock over the other dressings. She is in agreement with that plan we have done this before for her [name] she did excellent at that time. 4. I do believe a total contact cast would be an appropriate thing for her in regard to the right foot to help with offloading [name] friction reduction. With that being said I want to make sure that there is no evidence of cellulitis in the leg before we proceed [name] for that reason I did want a hold off today we will see how things go with the compression wrap [name] we will discuss further the plan for wound [name] if would not start this going forward when I see her back. We will see patient back for reevaluation in 1 week here in the clinic. If anything worsens or changes patient will contact our office for additional recommendations. Electronic Signature(s) Signed: 04/06/2021 2:42:05 PM By: Worthy Keeler PA-C Entered By: Worthy Keeler on 04/06/2021 14:42:05 -------------------------------------------------------------------------------- HxROS Details Patient Name: Date of Service: [name], [name] M. 04/06/2021 9:00 A M Medical Record Number: [id number] Patient Account Number: [id number] Date of Birth/Sex: Treating RN: [birth year] (60 y.o. [name] Primary Care Provider: [name] Other Clinician: Referring Provider: Treating Provider/Extender: Debbra Riding in Treatment: 0 Information Obtained From Patient Constitutional  Symptoms (General Health) Complaints [name] Symptoms: Negative for: Fatigue; Fever; Chills; Marked Weight Change Medical History: Past Medical History Notes: hypothyroidism Eyes Complaints [name] Symptoms: Negative for: Vision Changes; Glasses / Contacts Medical History: Positive for: Cataracts - both eyes Negative for: Glaucoma; Optic Neuritis Ear/Nose/Mouth/Throat Complaints [name] Symptoms: Negative for: Chronic sinus problems or rhinitis Medical History: Negative for: Chronic sinus problems/congestion; Middle ear problems Respiratory Complaints [name] Symptoms: Negative for: Chronic or frequent coughs; Shortness of Breath Medical History: Positive for: Asthma Negative for: Aspiration; Chronic Obstructive Pulmonary Disease (COPD); Pneumothorax; Sleep Apnea; Tuberculosis Genitourinary Complaints [name] Symptoms: Negative for: Frequent urination Medical History: Negative for: End Stage Renal Disease Integumentary (Skin) Complaints [name] Symptoms: Positive for: Wounds - currently  Medical History: Negative for: History of Burn Musculoskeletal Complaints [name] Symptoms: Negative for: Muscle Pain; Muscle Weakness Medical History: Positive for: Rheumatoid Arthritis Negative for: Gout; Osteoarthritis; Osteomyelitis Past Medical History Notes: Fibromyalgia, Scoliosis, Hematologic/Lymphatic Medical History: Positive for: Lymphedema Negative for: Anemia; Hemophilia; Human Immunodeficiency Virus; Sickle Cell Disease Cardiovascular Medical History: Positive for: Congestive Heart Failure; Coronary Artery Disease; Hypertension Negative for: Angina; Arrhythmia; Hypotension; Myocardial Infarction; Peripheral Arterial Disease; Peripheral Venous Disease; Phlebitis; Vasculitis Past Medical History Notes: cardiac stents Gastrointestinal Medical History: Past Medical History Notes: GERD Endocrine Medical History: Positive for: Type II Diabetes Past Medical History Notes: Hypothyroidism Time  with diabetes: since 2002 Treated with: Oral agents Blood sugar tested every day: Yes Tested : 2-3 times a day Immunological Medical History: Negative for: Lupus Erythematosus; Raynauds; Scleroderma Past Medical History Notes: Mix connective tissue disease- autoimmune Neurologic Medical History: Positive for: Neuropathy Negative for: Dementia; Quadriplegia; Paraplegia; Seizure Disorder Oncologic HBO Extended History Items Eyes: Cataracts Immunizations Pneumococcal Vaccine: Received Pneumococcal Vaccination: Yes Received Pneumococcal Vaccination On or After 60th Birthday: Yes Implantable Devices None Hospitalization / Surgery History Type of Hospitalization/Surgery 4 [name] 5th toe right foot amputations Dr. Sharol Given 01/2020 Family [name] Social History Cancer: No; Diabetes: Yes - Mother; Heart Disease: Yes - Mother,Father,Siblings; Hereditary Spherocytosis: No; Hypertension: Yes - Mother,Father,Siblings; Kidney Disease: No; Lung Disease: Yes - Father; Seizures: No; Stroke: Yes - Mother; Thyroid Problems: No; Tuberculosis: No; Former smoker - quit 15 years ago; Marital Status - Married; Alcohol Use: Never; Drug Use: No History; Caffeine Use: Rarely; Financial Concerns: No; Food, Clothing or Shelter Needs: No; Support System Lacking: No; Transportation Concerns: No Electronic Signature(s) Signed: 04/06/2021 4:30:28 PM By: Worthy Keeler PA-C Signed: 04/06/2021 5:41:26 PM By: Deon Pilling Entered By: Deon Pilling on 04/06/2021 09:24:01 -------------------------------------------------------------------------------- SuperBill Details Patient Name: Date of Service: [name], [name] M. 04/06/2021 Medical Record Number: [id number] Patient Account Number: [id number] Date of Birth/Sex: Treating RN: [birth year] (60 y.o. [name] Primary Care Provider: [name] Other Clinician: Referring Provider: Treating Provider/Extender: Debbra Riding in  Treatment: 0 Diagnosis Coding ICD-10 Codes Code Description [phone] 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 T81.31XA Disruption of external operation (surgical) wound, not elsewhere classified, initial encounter 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) I10 Essential (primary) hypertension Facility Procedures CPT4 Code: [id number] Description: 99213 - WOUND CARE VISIT-LEV 3 EST PT Modifier: 25 Quantity: 1 CPT4 Code: [id number] Description: (Facility Use Only) [id number] - APPLY MULTLAY COMPRS LWR RT LEG Modifier: Quantity: 1 Physician Procedures : CPT4 Code Description Modifier [id number] 99214 - WC PHYS LEVEL 4 - EST PT ICD-10 Diagnosis Description E11.621 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 T81.31XA  Disruption of external operation (surgical) wound, not elsewhere classified, initial encounter [phone] Acquired absence of other right toe(s) Quantity: 1 Electronic Signature(s) Signed: 04/06/2021 2:42:16 PM By: Worthy Keeler PA-C Entered By: Worthy Keeler on 04/06/2021 14:42:16

## 2021-04-13 ENCOUNTER — Encounter (HOSPITAL_BASED_OUTPATIENT_CLINIC_OR_DEPARTMENT_OTHER): Payer: 59 | Attending: Physician Assistant | Admitting: Physician Assistant

## 2021-04-13 ENCOUNTER — Other Ambulatory Visit: Payer: Self-pay

## 2021-04-13 DIAGNOSIS — E11621 Type 2 diabetes mellitus with foot ulcer: Secondary | ICD-10-CM | POA: Diagnosis not present

## 2021-04-13 DIAGNOSIS — L03315 Cellulitis of perineum: Secondary | ICD-10-CM | POA: Diagnosis not present

## 2021-04-13 DIAGNOSIS — L97519 Non-pressure chronic ulcer of other part of right foot with unspecified severity: Secondary | ICD-10-CM | POA: Diagnosis present

## 2021-04-13 DIAGNOSIS — L98492 Non-pressure chronic ulcer of skin of other sites with fat layer exposed: Secondary | ICD-10-CM | POA: Diagnosis not present

## 2021-04-13 DIAGNOSIS — X58XXXA Exposure to other specified factors, initial encounter: Secondary | ICD-10-CM | POA: Insufficient documentation

## 2021-04-13 DIAGNOSIS — I10 Essential (primary) hypertension: Secondary | ICD-10-CM | POA: Diagnosis not present

## 2021-04-13 DIAGNOSIS — Z87891 Personal history of nicotine dependence: Secondary | ICD-10-CM | POA: Insufficient documentation

## 2021-04-13 DIAGNOSIS — Z89421 Acquired absence of other right toe(s): Secondary | ICD-10-CM | POA: Diagnosis not present

## 2021-04-13 DIAGNOSIS — E11622 Type 2 diabetes mellitus with other skin ulcer: Secondary | ICD-10-CM | POA: Diagnosis not present

## 2021-04-13 DIAGNOSIS — L97512 Non-pressure chronic ulcer of other part of right foot with fat layer exposed: Secondary | ICD-10-CM | POA: Insufficient documentation

## 2021-04-13 DIAGNOSIS — T2131XA Burn of third degree of chest wall, initial encounter: Secondary | ICD-10-CM | POA: Diagnosis not present

## 2021-04-13 NOTE — Progress Notes (Addendum)
[name], [name] ([id number]) Visit Report for 04/13/2021 Chief Complaint Document Details Patient Name: Date of Service: [name], [name] 04/13/2021 7:30 A M Medical Record Number: [id number] Patient Account Number: [id number] Date of Birth/Sex: Treating RN: Feb 25, 1961 (60 y.o. [name] Primary Care Provider: [name] Other Clinician: Referring Provider: Treating Provider/Extender: Debbra Riding in Treatment: 1 Information Obtained from: Patient Chief Complaint Right foot ulcer Electronic Signature(s) Signed: 04/13/2021 8:15:31 AM By: Worthy Keeler PA-C Entered By: Worthy Keeler on 04/13/2021 08:15:31 -------------------------------------------------------------------------------- HPI Details Patient Name: Date of Service: [name], [name] 04/13/2021 7:30 A M Medical Record Number: [id number] Patient Account Number: [id number] Date of Birth/Sex: Treating RN: 10-11-1960 (60 y.o. [name] Primary Care Provider: [name] Other Clinician: Referring Provider: Treating Provider/Extender: Debbra Riding in Treatment: 1 History of Present Illness HPI Description: 07/23/2019 on evaluation today patient presents with a myriad of wounds noted at multiple locations over her right heel, right lower extremity, and abdominal region. She also has bilateral lower extremity lymphedema which is quite significant as well. Incidentally she also has congestive heart failure and hypertension. She also is obese. With that being said I think all this is contributing as well to her lower extremity edema which is very much uncontrolled. It seems like its been at least several months since she is worn any compression according to what she tells me. With that being said I am not sure exactly when that would have been. She does have fibrotic changes in the lower extremity secondary to lymphedema worse on the left than the  right. She did show me pictures of wound she had on the anterior portion of her shin which was quite significant fortunately that has healed. These issues have been intermittent at this time. Again I think that with appropriate compression therapy she may actually be doing better than what we are seeing at this point but again I am not really sure that she is ever been extremely compliant with that. Fortunately there is no signs of active infection at this time. No fever chills noted. As far as the abdominal ulcer she initially had one wound that she thinks may have been a bug bite. Subsequently she was put a dressing on this and she states that the tape pulled skin off on other locations causing other wounds that have not healed that is been about 3 months. The wounds on her lower extremities have been intermittent over 3 years. This includes the heel. 11/19; this is a patient that I have not seen previously. She was admitted to our clinic last week with multiple wounds including several superficial circular areas on her abdomen predominantly right upper quadrant. Also 1 in her umbilicus. She has an area on her right lateral malleolus which was new today. She has bilateral lower extremity edema with very significant stasis dermatitis on the left anterior tibial area. She has tightly adherent skin in her lower extremities probably secondary to cutaneous fibrosis in the area. We put her in compression last week. She comes in with the dressings reasonably saturated. 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 She has multiple areas on her arms from scratching. 12/3; the patient I saw for the first time 2 weeks ago. She has 3 small circular areas on her abdomen  2 in the right upper quadrant one on her umbilicus.  We have been using silver alginate to this area. She also had an area on her right lateral malleolus and right heel and right medial malleolus. We have been using silver alginate here under compression. She does not have an open area on the left leg today. We are going to order her stockings for the left leg. She clearly has chronic lymphedema in these areas. X-ray of the foot from 10/20 did not show any fracture or dislocation. The heel wound was noted there was no evidence of osteomyelitis She complains of generalized pruritus. 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 She has longstanding lymphedema. Although she does not have known liver disease I looked at his CT scan of the abdomen from February of this year that showed hepatic steatosis 12/11; patient still has no open area on the left leg we transitioned her into a stocking she had. Her stockings from [place] are supposed to be arriving later today which will be the stockings of choice. The only wound remaining on the right is the right heel 2 small superficial open areas. Everything else is well on its way to healing in the right leg few small excoriations. She has 1 major area remaining on her abdomen the rest seem to be healing 12/22 patient still has no open area on the left leg and she is still in a stocking. She had still 2 open areas on the tip of her right heel. These look like pressure related areas although the patient is not really certain how this is happening. She has an open heeled shoe that we provided. Still has 1 open area on the right lateral abdomen The patient has complaints of generalized pruritus. She has multiple excoriated areas on her abdomen that of close over her arms or thighs which I think are from scratching. She tells me that she has never had a basic work-up for this which might include basic lab work, liver functions, kidney functions, thyroid etc. She might also benefit  from a dermatologist 12/29; the patient has a deeper larger more painful wound on the tip of her right heel. Again these look like pressure ulcers although she wears an open toed heel pads or heel at night to prevent it from hitting the mattress etc. They tell me and remind me that this is been present on and off for about 2 years that has closed over but then will reopen. I did look over her lab work that was available in [place]. She does not have elevated liver function tests or creatinine. I was not able to see a TSH. This was in response to her complaints of generalized itching and a itch/scratch cycle. I also noted that she is on OxyContin and oxycodone and of course narcotics can cause generalized pruritus as a side effect Readmission: History From [place], [place] Last Week: 03/29/2021 this is a patient who presents for initial evaluation here in the clinic though I have seen her 1 time previously in 2020 this was in [place]. That was actually her initial visit therefore a heel ulceration. Subsequently that did not healing she actually saw me the 1 time in November and then subsequently saw Dr. Dellia Nims through the end of December where she apparently was healed. 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 This is on the right. With that being said right now she has a basically region on the lateral portion of the ray site where she is applying more pressure especially as her foot seems to be starting to turn in and this has become an increasingly significant issue for her to be honest. She does see Dr. Sharol Given and Dr. Sharol Given has had her on doxycycline for quite a bit of time here. Subsequently she is just now wrapping that out. I think that she probably would be benefited by continue the doxycycline for a time while we can work through what we need to do with things here. She does  have evidence of osteomyelitis based on what I saw on the MRI today. This obviously is unfortunate and definitely not something that she was hoping to hear. With that being said I do believe that she would potentially be a strong candidate for hyperbaric oxygen therapy. Also believe she could be a candidate for total contact cast though we have to be cautious due to the fact that how her foot is bending inward I think that this is good to be a little bit of a concern. We will definitely have to keeping a close eye on things. She does have a history of diabetes mellitus type 2. She also other than the amputation has a medical history positive for hypertension. She has never undergone hyperbaric oxygen therapy previously I did show her the chamber we did talk a little bit about it today as well. Admission [facility]: o 04/06/2021 Patient presents here in [place] today for evaluation. Again she is establishing care here after I saw her last week in [place]. Obviously I think that she is doing extremely well at this point which is great news and in general I am extremely pleased with where things stand from the standpoint of the appearance of the wound. Obviously this is not significantly smaller but does look a lot cleaner I think using the Hydrofera Blue has been of benefit over the past week. Nonetheless she still has a wound with issues with pressure here which I think is good to be an ongoing issue. Also think that the osteomyelitis which is chronic is also getting ongoing issue. She does want to try to do what she can to prevent this from worsening and ending with a below-knee amputation. T that end I do think that getting her into the hyperbaric oxygen chamber would be what we need to do. She has recently been seen by cardiology o and subsequently well as far as that is concerned. Her ejection fraction was appropriate for hyperbarics and everything seems to be doing great in that  regard. She has not had a recent chest x-ray she is a former smoker around 15 years ago she quit. Nonetheless I do believe with her asthma we do want to do a chest x-ray just to make sure everything is okay before proceeding with the hyperbarics although we can go ahead and see about getting the approval. I also think she is going require some sharp debridement and around this wound we also have to contact her insurance for prior approval on this as well. 04/13/2021 upon evaluation today patient appears to be doing a little bit better in regard to her leg in fact the swelling is dramatically better. Very pleased with where things stand in that regard. Fortunately there does not appear to be any signs of active infection at this time. No fevers, chills, nausea,  vomiting, or diarrhea. Electronic Signature(s) Signed: 04/13/2021 8:32:58 AM By: Worthy Keeler PA-C Entered By: Worthy Keeler on 04/13/2021 08:32:57 -------------------------------------------------------------------------------- Physical Exam Details Patient Name: Date of Service: [name], [name] 04/13/2021 7:30 A M Medical Record Number: [id number] Patient Account Number: [id number] Date of Birth/Sex: Treating RN: Jan 13, 1961 (60 y.o. [name] Primary Care Provider: [name] Other Clinician: Referring Provider: Treating Provider/Extender: Debbra Riding in Treatment: 1 Constitutional Well-nourished and well-hydrated in no acute distress. Respiratory normal breathing without difficulty. Psychiatric this patient is able to make decisions and demonstrates good insight into disease process. Alert and Oriented x 3. pleasant and cooperative. Notes Upon inspection patient's wound bed actually showed signs of good granulation epithelization at this point. There does not appear to be any evidence of infection which is great news and overall very pleased with where things stand. Electronic  Signature(s) Signed: 04/13/2021 8:33:12 AM By: Worthy Keeler PA-C Entered By: Worthy Keeler on 04/13/2021 08:33:12 -------------------------------------------------------------------------------- Physician Orders Details Patient Name: Date of Service: [name], [name] 04/13/2021 7:30 A M Medical Record Number: [id number] Patient Account Number: [id number] Date of Birth/Sex: Treating RN: 11-22-1960 (60 y.o. [name] Primary Care Provider: [name] Other Clinician: Referring Provider: Treating Provider/Extender: Debbra Riding in Treatment: 1 Verbal / Phone Orders: No Diagnosis Coding ICD-10 Coding Code Description E11.621 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 T81.31XA Disruption of external operation (surgical) wound, not elsewhere classified, initial encounter 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) I10 Essential (primary) hypertension Follow-up Appointments Return Appointment in 1 week. Bathing/ Shower/ Hygiene May shower with protection but do not get wound dressing(s) wet. Edema Control - Lymphedema / SCD / Other Bilateral Lower Extremities Elevate legs to the level of the heart or above for 30 minutes daily and/or when sitting, a frequency of: Avoid standing for long periods of time. Patient to wear own compression stockings every day. 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 Follow Hyperbaric Oxygen Glycemia Protocol A frin (Oxymetazoline HCL) 0.05%  nasal spray - 1 spray in both nostrils daily as needed prior to HBO treatment for difficulty clearing ears Wound Treatment Wound #8 - Foot Wound Laterality: Plantar, Right Peri-Wound Care: Sween Lotion (Moisturizing lotion) 1 x Per Week/30 Days Discharge Instructions: Apply moisturizing lotion as directed Prim Dressing: KerraCel Ag Gelling Fiber Dressing, 2x2 in (silver alginate) 1 x Per Week/30 Days ary Discharge Instructions: Apply silver alginate to right dorsal foot Prim Dressing: Hydrofera Blue Ready Foam, 2.5 x2.5 in 1 x Per Week/30 Days ary Discharge Instructions: Apply to wound bed as instructed Secondary Dressing: Woven Gauze Sponge, Non-Sterile 4x4 in 1 x Per Week/30 Days Discharge Instructions: Apply over primary dressing as directed. Secondary Dressing: ABD Pad, 5x9 1 x Per Week/30 Days Discharge Instructions: Apply over primary dressing as directed. Secondary Dressing: Optifoam Non-Adhesive Dressing, 4x4 in 1 x Per Week/30 Days Discharge Instructions: Apply over primary dressing cut to form donut Compression Wrap: CoFlex TLC XL 2-layer Compression System 4x7 (in/yd) 1 x Per Week/30 Days  Discharge Instructions: Apply CoFlex 2-layer compression as directed. (alt for 4 layer) Wound #9 - Amputation Site - Toe Wound Laterality: Right Peri-Wound Care: Sween Lotion (Moisturizing lotion) 1 x Per Week/30 Days Discharge Instructions: Apply moisturizing lotion as directed Prim Dressing: Hydrofera Blue Ready Foam, 2.5 x2.5 in 1 x Per Week/30 Days ary Discharge Instructions: Apply to wound bed as instructed Secondary Dressing: Woven Gauze Sponge, Non-Sterile 4x4 in 1 x Per Week/30 Days Discharge Instructions: Apply over primary dressing as directed. Secondary Dressing: ABD Pad, 5x9 1 x Per Week/30 Days Discharge Instructions: Apply over primary dressing as directed. Compression Wrap: CoFlex TLC XL 2-layer Compression System 4x7 (in/yd) 1 x Per Week/30 Days Discharge Instructions: Apply  CoFlex 2-layer compression as directed. (alt for 4 layer) GLYCEMIA INTERVENTIONS PROTOCOL PRE-HBO GLYCEMIA INTERVENTIONS ACTION INTERVENTION Obtain pre-HBO capillary blood glucose (ensure 1 physician order is in chart). A. Notify HBO physician and await physician orders. 2 If result is 70 mg/dl or below: B. If the result meets the hospital definition of a critical result, follow hospital policy. A. Give patient an 8 ounce Glucerna Shake, an 8 ounce Ensure, or 8 ounces of a Glucerna/Ensure equivalent dietary supplement*. B. Wait 30 minutes. If result is 71 mg/dl to 130 mg/dl: C. Retest patients capillary blood glucose (CBG). D. If result greater than or equal to 110 mg/dl, proceed with HBO. If result less than 110 mg/dl, notify HBO physician and consider holding HBO. If result is 131 mg/dl to 249 mg/dl: A. Proceed with HBO. A. Notify HBO physician and await physician orders. B. It is recommended to hold HBO and do If result is 250 mg/dl or greater: blood/urine ketone testing. C. If the result meets the hospital definition of a critical result, follow hospital policy. POST-HBO GLYCEMIA INTERVENTIONS ACTION INTERVENTION Obtain post HBO capillary blood glucose (ensure 1 physician order is in chart). A. Notify HBO physician and await physician orders. 2 If result is 70 mg/dl or below: B. If the result meets the hospital definition of a critical result, follow hospital policy. A. Give patient an 8 ounce Glucerna Shake, an 8 ounce Ensure, or 8 ounces of a Glucerna/Ensure equivalent dietary supplement*. B. Wait 15 minutes for symptoms of If result is 71 mg/dl to 100 mg/dl: hypoglycemia (i.e. nervousness, anxiety, sweating, chills, clamminess, irritability, confusion, tachycardia or dizziness). C. If patient asymptomatic, discharge patient. If patient symptomatic, repeat capillary blood glucose (CBG) and notify HBO physician. If result is 101 mg/dl to 249 mg/dl: A.  Discharge patient. A. Notify HBO physician and await physician orders. B. It is recommended to do blood/urine ketone If result is 250 mg/dl or greater: testing. C. If the result meets the hospital definition of a critical result, follow hospital policy. *Juice or candies are NOT equivalent products. If patient refuses the Glucerna or Ensure, please consult the hospital dietitian for an appropriate substitute. Electronic Signature(s) Signed: 04/13/2021 5:22:07 PM By: Worthy Keeler PA-C Signed: 04/13/2021 5:57:23 PM By: Baruch Gouty RN, BSN Entered By: Baruch Gouty on 04/13/2021 08:27:45 -------------------------------------------------------------------------------- Problem List Details Patient Name: Date of Service: [name], [name] 04/13/2021 7:30 A M Medical Record Number: [id number] Patient Account Number: [id number] Date of Birth/Sex: Treating RN: 22-Apr-1961 (60 y.o. [name] Primary Care Provider: [name] Other Clinician: Referring Provider: Treating Provider/Extender: Debbra Riding in Treatment: 1 Active Problems ICD-10 Encounter Code Description Active Date MDM Diagnosis E11.621 Type 2 diabetes mellitus with foot ulcer 04/06/2021 No Yes L97.512 Non-pressure chronic ulcer of other  part of right foot with fat layer exposed 04/06/2021 No Yes T81.31XA Disruption of external operation (surgical) wound, not elsewhere classified, 04/06/2021 No Yes initial encounter 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) 04/06/2021 No Yes I10 Essential (primary) hypertension 04/06/2021 No Yes Inactive Problems Resolved Problems Electronic Signature(s) Signed: 04/13/2021 8:15:25 AM By: Worthy Keeler PA-C Entered By: Worthy Keeler on 04/13/2021 08:15:24 -------------------------------------------------------------------------------- Progress Note Details Patient Name: Date of Service: [name], [name] 04/13/2021 7:30 A M Medical  Record Number: [id number] Patient Account Number: [id number] Date of Birth/Sex: Treating RN: February 13, 1961 (60 y.o. [name] Primary Care Provider: [name] Other Clinician: Referring Provider: Treating Provider/Extender: Debbra Riding in Treatment: 1 Subjective Chief Complaint Information obtained from Patient Right foot ulcer History of Present Illness (HPI) 07/23/2019 on evaluation today patient presents with a myriad of wounds noted at multiple locations over her right heel, right lower extremity, and abdominal region. She also has bilateral lower extremity lymphedema which is quite significant as well. Incidentally she also has congestive heart failure and hypertension. She also is obese. With that being said I think all this is contributing as well to her lower extremity edema which is very much uncontrolled. It seems like its been at least several months since she is worn any compression according to what she tells me. With that being said I am not sure exactly when that would have been. She does have fibrotic changes in the lower extremity secondary to lymphedema worse on the left than the right. She did show me pictures of wound she had on the anterior portion of her shin which was quite significant fortunately that has healed. These issues have been intermittent at this time. Again I think that with appropriate compression therapy she may actually be doing better than what we are seeing at this point but again I am not really sure that she is ever been extremely compliant with that. Fortunately there is no signs of active infection at this time. No fever chills noted. As far as the abdominal ulcer she initially had one wound that she thinks may have been a bug bite. Subsequently she was put a dressing on this and she states that the tape pulled skin off on other locations causing other wounds that have not healed that is been about 3  months. The wounds on her lower extremities have been intermittent over 3 years. This includes the heel. 11/19; this is a patient that I have not seen previously. She was admitted to our clinic last week with multiple wounds including several superficial circular areas on her abdomen predominantly right upper quadrant. Also 1 in her umbilicus. She has an area on her right lateral malleolus which was new today. She has bilateral lower extremity edema with very significant stasis dermatitis on the left anterior tibial area. She has tightly adherent skin in her lower extremities probably secondary to cutaneous fibrosis in the area. We put her in compression last week. She comes in with the dressings reasonably saturated. 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 She has multiple areas on her arms from scratching. 12/3; the patient I saw for the first time 2 weeks ago. She has 3 small circular areas on her abdomen 2 in the right upper quadrant one on her umbilicus. We have been using silver  alginate to this area. She also had an area on her right lateral malleolus and right heel and right medial malleolus. We have been using silver alginate here under compression. She does not have an open area on the left leg today. We are going to order her stockings for the left leg. She clearly has chronic lymphedema in these areas. X-ray of the foot from 10/20 did not show any fracture or dislocation. The heel wound was noted there was no evidence of osteomyelitis She complains of generalized pruritus. 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 She has longstanding lymphedema. Although she does not have known liver disease I looked at his CT scan of the abdomen from February of this  year that showed hepatic steatosis 12/11; patient still has no open area on the left leg we transitioned her into a stocking she had. Her stockings from [place] are supposed to be arriving later today which will be the stockings of choice. The only wound remaining on the right is the right heel 2 small superficial open areas. Everything else is well on its way to healing in the right leg few small excoriations. She has 1 major area remaining on her abdomen the rest seem to be healing 12/22 patient still has no open area on the left leg and she is still in a stocking. She had still 2 open areas on the tip of her right heel. These look like pressure related areas although the patient is not really certain how this is happening. She has an open heeled shoe that we provided. Still has 1 open area on the right lateral abdomen The patient has complaints of generalized pruritus. She has multiple excoriated areas on her abdomen that of close over her arms or thighs which I think are from scratching. She tells me that she has never had a basic work-up for this which might include basic lab work, liver functions, kidney functions, thyroid etc. She might also benefit from a dermatologist 12/29; the patient has a deeper larger more painful wound on the tip of her right heel. Again these look like pressure ulcers although she wears an open toed heel pads or heel at night to prevent it from hitting the mattress etc. They tell me and remind me that this is been present on and off for about 2 years that has closed over but then will reopen. I did look over her lab work that was available in [place]. She does not have elevated liver function tests or creatinine. I was not able to see a TSH. This was in response to her complaints of generalized itching and a itch/scratch cycle. I also noted that she is on OxyContin and oxycodone and of course narcotics can cause generalized pruritus as a side  effect Readmission: History From [place], [place] Last Week: 03/29/2021 this is a patient who presents for initial evaluation here in the clinic though I have seen her 1 time previously in 2020 this was in [place]. That was actually her initial visit therefore a heel ulceration. Subsequently that did not healing she actually saw me the 1 time in November and then subsequently saw Dr. Dellia Nims through the end of December where she apparently was healed. 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 This is on the right. With that being said right now she has a  basically region on the lateral portion of the ray site where she is applying more pressure especially as her foot seems to be starting to turn in and this has become an increasingly significant issue for her to be honest. She does see Dr. Sharol Given and Dr. Sharol Given has had her on doxycycline for quite a bit of time here. Subsequently she is just now wrapping that out. I think that she probably would be benefited by continue the doxycycline for a time while we can work through what we need to do with things here. She does have evidence of osteomyelitis based on what I saw on the MRI today. This obviously is unfortunate and definitely not something that she was hoping to hear. With that being said I do believe that she would potentially be a strong candidate for hyperbaric oxygen therapy. Also believe she could be a candidate for total contact cast though we have to be cautious due to the fact that how her foot is bending inward I think that this is good to be a little bit of a concern. We will definitely have to keeping a close eye on things. She does have a history of diabetes mellitus type 2. She also other than the amputation has a medical history positive for hypertension. She has never undergone hyperbaric oxygen therapy previously I did show her the  chamber we did talk a little bit about it today as well. Admission [facility]: o 04/06/2021 Patient presents here in [place] today for evaluation. Again she is establishing care here after I saw her last week in [place]. Obviously I think that she is doing extremely well at this point which is great news and in general I am extremely pleased with where things stand from the standpoint of the appearance of the wound. Obviously this is not significantly smaller but does look a lot cleaner I think using the Hydrofera Blue has been of benefit over the past week. Nonetheless she still has a wound with issues with pressure here which I think is good to be an ongoing issue. Also think that the osteomyelitis which is chronic is also getting ongoing issue. She does want to try to do what she can to prevent this from worsening and ending with a below-knee amputation. T that end I do think that getting her into the hyperbaric oxygen chamber would be what we need to do. She has recently been seen by cardiology o and subsequently well as far as that is concerned. Her ejection fraction was appropriate for hyperbarics and everything seems to be doing great in that regard. She has not had a recent chest x-ray she is a former smoker around 15 years ago she quit. Nonetheless I do believe with her asthma we do want to do a chest x-ray just to make sure everything is okay before proceeding with the hyperbarics although we can go ahead and see about getting the approval. I also think she is going require some sharp debridement and around this wound we also have to contact her insurance for prior approval on this as well. 04/13/2021 upon evaluation today patient appears to be doing a little bit better in regard to her leg in fact the swelling is dramatically better. Very pleased with where things stand in that regard. Fortunately there does not appear to be any signs of active infection at this time. No  fevers, chills, nausea, vomiting, or diarrhea. Objective Constitutional Well-nourished and well-hydrated in no acute distress. Vitals Time Taken:  7:54 AM, Height: 62 in, Weight: 323 lbs, BMI: 59.1, Temperature: 98.2 F, Pulse: 83 bpm, Respiratory Rate: 20 breaths/min, Blood Pressure: 108/67 mmHg, Capillary Blood Glucose: 124 mg/dl. General Notes: glucose per pt report Respiratory normal breathing without difficulty. Psychiatric this patient is able to make decisions and demonstrates good insight into disease process. Alert and Oriented x 3. pleasant and cooperative. General Notes: Upon inspection patient's wound bed actually showed signs of good granulation epithelization at this point. There does not appear to be any evidence of infection which is great news and overall very pleased with where things stand. Integumentary (Hair, Skin) Wound #8 status is Open. Original cause of wound was Gradually Appeared. The date acquired was: 11/09/2020. The wound has been in treatment 1 weeks. The wound is located on the [place]. The wound measures 1.8cm length x 1.3cm width x 0.2cm depth; 1.838cm^2 area and 0.368cm^3 volume. There is Fat Layer (Subcutaneous Tissue) exposed. There is no tunneling noted, however, there is undermining starting at 4:00 and ending at 12:00 with a maximum distance of 0.3cm. There is a medium amount of serosanguineous drainage noted. The wound margin is well defined and not attached to the wound base. There is large (67-100%) pink granulation within the wound bed. There is a small (1-33%) amount of necrotic tissue within the wound bed. Wound #9 status is Open. Original cause of wound was Surgical Injury. The date acquired was: 01/30/2020. The wound has been in treatment 1 weeks. The wound is located on the Right Amputation Site - T The wound measures 0.5cm length x 0.3cm width x 0.1cm depth; 0.118cm^2 area and 0.012cm^3 volume. oe. There is Fat Layer (Subcutaneous  Tissue) exposed. There is no tunneling or undermining noted. There is a medium amount of serosanguineous drainage noted. The wound margin is distinct with the outline attached to the wound base. There is large (67-100%) pink granulation within the wound bed. There is no necrotic tissue within the wound bed. Assessment Active Problems ICD-10 Type 2 diabetes mellitus with foot ulcer Non-pressure chronic ulcer of other part of right foot with fat layer exposed Disruption of external operation (surgical) wound, not elsewhere classified, initial encounter Acquired absence of other right toe(s) Essential (primary) hypertension Procedures There was a Three Layer Compression Therapy Procedure by Deon Pilling, RN. Post procedure Diagnosis Wound #: Same as Pre-Procedure Plan Follow-up Appointments: Return Appointment in 1 week. Bathing/ Shower/ Hygiene: May shower with protection but do not get wound dressing(s) wet. Edema Control - Lymphedema / SCD / Other: Elevate legs to the level of the heart or above for 30 minutes daily and/or when sitting, a frequency of: Avoid standing for long periods of time. Patient to wear own compression stockings every day. 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 Follow Hyperbaric Oxygen Glycemia Protocol Afrin (Oxymetazoline HCL) 0.05% nasal spray - 1 spray in both nostrils daily as needed prior to HBO treatment for difficulty clearing ears WOUND #8: - Foot Wound Laterality: Plantar, Right Peri-Wound Care: Sween Lotion (Moisturizing lotion) 1 x Per Week/30 Days Discharge  Instructions: Apply moisturizing lotion as directed Prim Dressing: KerraCel Ag Gelling Fiber Dressing, 2x2 in (silver alginate) 1  x Per Week/30 Days ary Discharge Instructions: Apply silver alginate to right dorsal foot Prim Dressing: Hydrofera Blue Ready Foam, 2.5 x2.5 in 1 x Per Week/30 Days ary Discharge Instructions: Apply to wound bed as instructed Secondary Dressing: Woven Gauze Sponge, Non-Sterile 4x4 in 1 x Per Week/30 Days Discharge Instructions: Apply over primary dressing as directed. Secondary Dressing: ABD Pad, 5x9 1 x Per Week/30 Days Discharge Instructions: Apply over primary dressing as directed. Secondary Dressing: Optifoam Non-Adhesive Dressing, 4x4 in 1 x Per Week/30 Days Discharge Instructions: Apply over primary dressing cut to form donut Com pression Wrap: CoFlex TLC XL 2-layer Compression System 4x7 (in/yd) 1 x Per Week/30 Days Discharge Instructions: Apply CoFlex 2-layer compression as directed. (alt for 4 layer) WOUND #9: - Amputation Site - T oe Wound Laterality: Right Peri-Wound Care: Sween Lotion (Moisturizing lotion) 1 x Per Week/30 Days Discharge Instructions: Apply moisturizing lotion as directed Prim Dressing: Hydrofera Blue Ready Foam, 2.5 x2.5 in 1 x Per Week/30 Days ary Discharge Instructions: Apply to wound bed as instructed Secondary Dressing: Woven Gauze Sponge, Non-Sterile 4x4 in 1 x Per Week/30 Days Discharge Instructions: Apply over primary dressing as directed. Secondary Dressing: ABD Pad, 5x9 1 x Per Week/30 Days Discharge Instructions: Apply over primary dressing as directed. Com pression Wrap: CoFlex TLC XL 2-layer Compression System 4x7 (in/yd) 1 x Per Week/30 Days Discharge Instructions: Apply CoFlex 2-layer compression as directed. (alt for 4 layer) 1. I would recommend that we go ahead and continue with the wound care measures currently. I do believe that the patient would benefit from some sharp debridement we will work on getting the  approval from her insurance for debridement that has to be preapproved. Again I am not exactly sure the process but I will have the office manager work on this. 2. I am going to recommend as well that we have the patient continue with the current measures with the compression wrap that seems to be doing great for her. I am also going to suggest that we go ahead and use Hydrofera Blue which I think is done a great job on the bottom of the foot on the top of the foot which is using silver alginate to try to keep this dry. 3. I am also can recommend that we go ahead and proceed with working on the approval for hyperbarics. The patient actually is already in the works in that regard we will just wait and hear back from her insurance. We will see patient back for reevaluation in 1 week here in the clinic. If anything worsens or changes patient will contact our office for additional recommendations. Electronic Signature(s) Signed: 04/13/2021 8:34:20 AM By: Worthy Keeler PA-C Previous Signature: 04/13/2021 8:33:38 AM Version By: Worthy Keeler PA-C Entered By: Worthy Keeler on 04/13/2021 08:34:20 -------------------------------------------------------------------------------- SuperBill Details Patient Name: Date of Service: [name], [name] 04/13/2021 Medical Record Number: [id number] Patient Account Number: [id number] Date of Birth/Sex: Treating RN: 1961/04/17 (60 y.o. [name] Primary Care Provider: [name] Other Clinician: Referring Provider: Treating Provider/Extender: Debbra Riding in Treatment: 1 Diagnosis Coding ICD-10 Codes Code Description [phone] 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 T81.31XA Disruption of external operation (surgical) wound, not elsewhere classified, initial encounter 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) I10 Essential  (primary) hypertension Facility Procedures CPT4 Code: [id number] Description: (Facility Use Only) [phone] - APPLY MULTLAY COMPRS LWR  RT LEG Modifier: Quantity: 1 Physician Procedures Electronic Signature(s) Signed: 04/13/2021 8:33:51 AM By: Worthy Keeler PA-C Entered By: Worthy Keeler on 04/13/2021 08:33:51

## 2021-04-15 NOTE — Progress Notes (Signed)
[name], [name] ([id number]) Visit Report for 04/13/2021 Arrival Information Details Patient Name: Date of Service: [name], [name] 04/13/2021 7:30 A M Medical Record Number: [id number] Patient Account Number: [id number] Date of Birth/Sex: Treating RN: 1961/03/21 (60 y.o. Dorthula Perfect, Emeterio Reeve Primary Care Elizabth Palka: [name] Other Clinician: Referring Tiara Bartoli: Treating Latorsha Curling/Extender: Juanna Cao in Treatment: 1 Visit Information History Since Last Visit Added or deleted any medications: No Patient Arrived: Wheel Chair Any new allergies or adverse reactions: No Arrival Time: 07:54 Had a fall or experienced change in No Accompanied By: husband activities of daily living that may affect Transfer Assistance: None risk of falls: Patient Identification Verified: Yes Signs or symptoms of abuse/neglect since last visito No Secondary Verification Process Completed: Yes Hospitalized since last visit: No Patient Requires Transmission-Based Precautions: No Implantable device outside of the clinic excluding No Patient Has Alerts: Yes cellular tissue based products placed in the center Patient Alerts: [facility] 03/2021 since last visit: ABI: 1.24 Has Dressing in Place as Prescribed: Yes Has Compression in Place as Prescribed: Yes Pain Present Now: No Electronic Signature(s) Signed: 04/15/2021 12:35:09 PM By: Zandra Abts RN, BSN Entered By: Zandra Abts on 04/13/2021 07:54:23 -------------------------------------------------------------------------------- Clinic Level of Care Assessment Details Patient Name: Date of Service: [name], [name] 04/13/2021 7:30 A M Medical Record Number: [id number] Patient Account Number: [id number] Date of Birth/Sex: Treating RN: 08/17/1961 (60 y.o. Tommye Standard Primary Care Majel Giel: [name] Other Clinician: Referring Camie Hauss: Treating Anavi Branscum/Extender: Juanna Cao in Treatment: 1 Clinic Level of Care Assessment Items TOOL 4 Quantity Score []  - 0 Use when only an EandM is performed on FOLLOW-UP visit ASSESSMENTS - Nursing Assessment / Reassessment X- 1 10 Reassessment of Co-morbidities (includes updates in patient status) X- 1 5 Reassessment of Adherence to Treatment Plan ASSESSMENTS - Wound and Skin A ssessment / Reassessment []  - 0 Simple Wound Assessment / Reassessment - one wound X- 2 5 Complex Wound Assessment / Reassessment - multiple wounds []  - 0 Dermatologic / Skin Assessment (not related to wound area) ASSESSMENTS - Focused Assessment X- 1 5 Circumferential Edema Measurements - multi extremities []  - 0 Nutritional Assessment / Counseling / Intervention X- 1 5 Lower Extremity Assessment (monofilament, tuning fork, pulses) []  - 0 Peripheral Arterial Disease Assessment (using hand held doppler) ASSESSMENTS - Ostomy and/or Continence Assessment and Care []  - 0 Incontinence Assessment and Management []  - 0 Ostomy Care Assessment and Management (repouching, etc.) PROCESS - Coordination of Care X - Simple Patient / Family Education for ongoing care 1 15 []  - 0 Complex (extensive) Patient / Family Education for ongoing care X- 1 10 Staff obtains , Records, T Results / Process Orders est []  - 0 Staff telephones HHA, Nursing Homes / Clarify orders / etc []  - 0 Routine Transfer to another Facility (non-emergent condition) []  - 0 Routine Hospital Admission (non-emergent condition) []  - 0 New Admissions / / Ordering NPWT Apligraf, etc. , []  - 0 Emergency Hospital Admission (emergent condition) X- 1 10 Simple Discharge Coordination []  - 0 Complex (extensive) Discharge Coordination PROCESS - Special Needs []  - 0 Pediatric / Minor Patient Management []  - 0 Isolation Patient Management []  - 0 Hearing / Language / Visual special needs []  - 0 Assessment of Community assistance  (transportation, D/C planning, etc.) []  - 0 Additional assistance / Altered mentation []  - 0 Support Surface(s) Assessment (bed, cushion, seat, etc.) INTERVENTIONS - Wound Cleansing / Measurement []  -  0 Simple Wound Cleansing - one wound X- 2 5 Complex Wound Cleansing - multiple wounds X- 1 5 Wound Imaging (photographs - any number of wounds) []  - 0 Wound Tracing (instead of photographs) []  - 0 Simple Wound Measurement - one wound X- 2 5 Complex Wound Measurement - multiple wounds INTERVENTIONS - Wound Dressings X - Small Wound Dressing one or multiple wounds 2 10 []  - 0 Medium Wound Dressing one or multiple wounds []  - 0 Large Wound Dressing one or multiple wounds X- 1 5 Application of Medications - topical []  - 0 Application of Medications - injection INTERVENTIONS - Miscellaneous []  - 0 External ear exam []  - 0 Specimen Collection (cultures, biopsies, blood, body fluids, etc.) []  - 0 Specimen(s) / Culture(s) sent or taken to Lab for analysis []  - 0 Patient Transfer (multiple staff / Nurse, adult / Similar devices) []  - 0 Simple Staple / Suture removal (25 or less) []  - 0 Complex Staple / Suture removal (26 or more) []  - 0 Hypo / Hyperglycemic Management (close monitor of Blood Glucose) []  - 0 Ankle / Brachial Index (ABI) - do not check if billed separately X- 1 5 Vital Signs Has the patient been seen at the hospital within the last three years: Yes Total Score: 125 Level Of Care: New/Established - Level 4 Electronic Signature(s) Signed: 04/13/2021 5:57:23 PM By: Zenaida Deed RN, BSN Entered By: Zenaida Deed on 04/13/2021 37:35:78 -------------------------------------------------------------------------------- Compression Therapy Details Patient Name: Date of Service: [name], [name] 04/13/2021 7:30 A M Medical Record Number: [id number] Patient Account Number: [id number] Date of Birth/Sex: Treating RN: 10/09/1960 (60 y.o. Tommye Standard Primary Care  Boyd Litaker: [name] Other Clinician: Referring Kendalynn Wideman: Treating Conway Fedora/Extender: Juanna Cao in Treatment: 1 Compression Therapy Performed for Wound Assessment: NonWound Condition Lymphedema - Right Leg Performed By: Clinician Shawn Stall, RN Compression Type: Three Layer Post Procedure Diagnosis Same as Pre-procedure Electronic Signature(s) Signed: 04/13/2021 5:57:23 PM By: Zenaida Deed RN, BSN Entered By: Zenaida Deed on 04/13/2021 08:23:04 -------------------------------------------------------------------------------- Encounter Discharge Information Details Patient Name: Date of Service: [name], [name] 04/13/2021 7:30 A M Medical Record Number: [id number] Patient Account Number: [id number] Date of Birth/Sex: Treating RN: [birth year] (60 y.o. Arta Silence Primary Care Yumna Ebers: [name] Other Clinician: Referring Mareena Cavan: Treating Nedim Oki/Extender: [place] Ravens, Salvadore Dom in Treatment: 1 Encounter Discharge Information Items Discharge Condition: Stable Ambulatory Status: Wheelchair Discharge Destination: Home Transportation: Private Auto Accompanied By: husband Schedule Follow-up Appointment: Yes Clinical Summary of Care: Electronic Signature(s) Signed: 04/13/2021 4:43:23 PM By: Shawn Stall Entered By: Shawn Stall on 04/13/2021 09:45:40 -------------------------------------------------------------------------------- Lower Extremity Assessment Details Patient Name: Date of Service: [name], [name] 04/13/2021 7:30 A M Medical Record Number: [id number] Patient Account Number: [id number] Date of Birth/Sex: Treating RN: 12-Jun-1961 (60 y.o. Wynelle Link Primary Care Stephonie Wilcoxen: [name] Other Clinician: Referring Amyra Vantuyl: Treating Elani Delph/Extender: Juanna Cao in Treatment: 1 Edema Assessment Assessed: [Left: No] [Right: No] Edema: [Left: Ye]  [Right: s] Calf Left: Right: Point of Measurement: 36 cm From Medial Instep 42 cm Ankle Left: Right: Point of Measurement: 10 cm From Medial Instep 23.2 cm Vascular Assessment Pulses: Dorsalis Pedis Palpable: [Right:Yes] Electronic Signature(s) Signed: 04/15/2021 12:35:09 PM By: Zandra Abts RN, BSN Entered By: Zandra Abts on 04/13/2021 08:05:05 -------------------------------------------------------------------------------- Multi-Disciplinary Care Plan Details Patient Name: Date of Service: [name], [name] 04/13/2021 7:30 A M Medical Record Number: [id number] Patient Account Number: [id number] Date of Birth/Sex: Treating  RN: [birth year] (60 y.o. Tommye Standard Primary Care Izaya Netherton: [name] Other Clinician: Referring Naydene Kamrowski: Treating Holley Wirt/Extender: Juanna Cao in Treatment: 1 Multidisciplinary Care Plan reviewed with physician Active Inactive HBO Nursing Diagnoses: Potential for barotraumas to ears, sinuses, teeth, and lungs or cerebral gas embolism related to changes in atmospheric pressure inside hyperbaric oxygen chamber Potential for oxygen toxicity seizures related to delivery of 100% oxygen at an increased atmospheric pressure Potential for pulmonary oxygen toxicity related to delivery of 100% oxygen at an increased atmospheric pressure Goals: Patient will tolerate the hyperbaric oxygen therapy treatment Date Initiated: 04/06/2021 Target Resolution Date: 05/04/2021 Goal Status: Active Patient/caregiver will verbalize understanding of HBO goals, rationale, procedures and potential hazards Date Initiated: 04/06/2021 Target Resolution Date: 05/04/2021 Goal Status: Active Interventions: Administer a five (5) minute air break for patient if signs and symptoms of seizure appear and notify the hyperbaric physician Assess and provide for patients comfort related to the hyperbaric environment and equalization of middle  ear Assess for signs and symptoms related to adverse events, including but not limited to confinement anxiety, pneumothorax, oxygen toxicity and baurotrauma Notes: Nutrition Nursing Diagnoses: Impaired glucose control: actual or potential Potential for alteratiion in Nutrition/Potential for imbalanced nutrition Goals: Patient/caregiver will maintain therapeutic glucose control Date Initiated: 04/06/2021 Target Resolution Date: 05/04/2021 Goal Status: Active Interventions: Assess HgA1c results as ordered upon admission and as needed Assess patient nutrition upon admission and as needed per policy Provide education on elevated blood sugars and impact on wound healing Treatment Activities: Patient referred to Primary Care Physician for further nutritional evaluation : 04/06/2021 Notes: Wound/Skin Impairment Nursing Diagnoses: Impaired tissue integrity Knowledge deficit related to ulceration/compromised skin integrity Goals: Patient/caregiver will verbalize understanding of skin care regimen Date Initiated: 04/06/2021 Target Resolution Date: 05/04/2021 Goal Status: Active Ulcer/skin breakdown will have a volume reduction of 30% by week 4 Date Initiated: 04/06/2021 Target Resolution Date: 05/04/2021 Goal Status: Active Interventions: Assess patient/caregiver ability to obtain necessary supplies Assess patient/caregiver ability to perform ulcer/skin care regimen upon admission and as needed Assess ulceration(s) every visit Provide education on ulcer and skin care Treatment Activities: Skin care regimen initiated : 04/06/2021 Topical wound management initiated : 04/06/2021 Notes: Electronic Signature(s) Signed: 04/13/2021 5:57:23 PM By: Zenaida Deed RN, BSN Entered By: Zenaida Deed on 04/13/2021 07:55:24 -------------------------------------------------------------------------------- Pain Assessment Details Patient Name: Date of Service: [name], [name] 04/13/2021 7:30 A  M Medical Record Number: [id number] Patient Account Number: [id number] Date of Birth/Sex: Treating RN: Jan 09, 1961 (60 y.o. Wynelle Link Primary Care Walsie Smeltz: [name] Other Clinician: Referring Rashan Rounsaville: Treating Carissa Musick/Extender: Juanna Cao in Treatment: 1 Active Problems Location of Pain Severity and Description of Pain Patient Has Paino Yes Site Locations Pain Location: Pain in Ulcers Rate the pain. Current Pain Level: 3 Character of Pain Describe the Pain: Dull Pain Management and Medication Current Pain Management: Medication: Yes Cold Application: No Rest: No Massage: No Activity: No T.E.N.S.: No Heat Application: No Leg drop or elevation: No Is the Current Pain Management Adequate: Adequate How does your wound impact your activities of daily livingo Sleep: No Bathing: No Appetite: No Relationship With Others: No Bladder Continence: No Emotions: No Bowel Continence: No Work: No Toileting: No Drive: No Dressing: No Hobbies: No Psychologist, prison and probation services) Signed: 04/15/2021 12:35:09 PM By: Zandra Abts RN, BSN Entered By: Zandra Abts on 04/13/2021 07:55:07 -------------------------------------------------------------------------------- Patient/Caregiver Education Details Patient Name: Date of Service: [name], [name] 8/3/2022andnbsp7:30 A M Medical Record Number: [id number] Patient  Account Number: [id number] Date of Birth/Gender: Treating RN: 1961/01/25 (60 y.o. Tommye Standard Primary Care Physician: [name] Other Clinician: Referring Physician: Treating Physician/Extender: Juanna Cao in Treatment: 1 Education Assessment Education Provided To: Patient Education Topics Provided Elevated Blood Sugar/ Impact on Healing: Methods: Explain/Verbal Responses: Reinforcements needed, State content correctly Hyperbaric Oxygenation: Methods:  Explain/Verbal Responses: Reinforcements needed, State content correctly Wound/Skin Impairment: Methods: Explain/Verbal Responses: Reinforcements needed, State content correctly Electronic Signature(s) Signed: 04/13/2021 5:57:23 PM By: Zenaida Deed RN, BSN Entered By: Zenaida Deed on 04/13/2021 07:55:56 -------------------------------------------------------------------------------- Wound Assessment Details Patient Name: Date of Service: [name], [name] 04/13/2021 7:30 A M Medical Record Number: [id number] Patient Account Number: [id number] Date of Birth/Sex: Treating RN: 07-13-1961 (60 y.o. Wynelle Link Primary Care Foday Cone: [name] Other Clinician: Referring Rami Budhu: Treating Chenelle Benning/Extender: Juanna Cao in Treatment: 1 Wound Status Wound Number: 8 Primary Diabetic Wound/Ulcer of the Lower Extremity Etiology: Wound Location: Right, Plantar Foot Wound Open Wounding Event: Gradually Appeared Status: Date Acquired: 11/09/2020 Comorbid Cataracts, Lymphedema, Asthma, Congestive Heart Failure, Weeks Of Treatment: 1 History: Coronary Artery Disease, Hypertension, Type II Diabetes, Clustered Wound: No Rheumatoid Arthritis, Neuropathy Photos Photo Uploaded By: Haywood Pao on 04/13/2021 16:20:31 Wound Measurements Length: (cm) 1.8 Width: (cm) 1.3 Depth: (cm) 0.2 Area: (cm) 1.838 Volume: (cm) 0.368 % Reduction in Area: -30% % Reduction in Volume: -30% Epithelialization: Small (1-33%) Tunneling: No Undermining: Yes Starting Position (o'clock): 4 Ending Position (o'clock): 12 Maximum Distance: (cm) 0.3 Wound Description Classification: Grade 3 Wound Margin: Well defined, not attached Exudate Amount: Medium Exudate Type: Serosanguineous Exudate Color: red, brown Wound Bed Granulation Amount: Large (67-100%) Granulation Quality: Pink Necrotic Amount: Small (1-33%) Foul Odor After Cleansing: No Slough/Fibrino  Yes Exposed Structure Fascia Exposed: No Fat Layer (Subcutaneous Tissue) Exposed: Yes Tendon Exposed: No Muscle Exposed: No Joint Exposed: No Bone Exposed: No Treatment Notes Wound #8 (Foot) Wound Laterality: Plantar, Right Cleanser Peri-Wound Care Sween Lotion (Moisturizing lotion) Discharge Instruction: Apply moisturizing lotion as directed Topical Primary Dressing KerraCel Ag Gelling Fiber Dressing, 2x2 in (silver alginate) Discharge Instruction: Apply silver alginate to right dorsal foot Hydrofera Blue Ready Foam, 2.5 x2.5 in Discharge Instruction: Apply to wound bed as instructed Secondary Dressing Woven Gauze Sponge, Non-Sterile 4x4 in Discharge Instruction: Apply over primary dressing as directed. ABD Pad, 5x9 Discharge Instruction: Apply over primary dressing as directed. Optifoam Non-Adhesive Dressing, 4x4 in Discharge Instruction: Apply over primary dressing cut to form donut Secured With Compression Wrap CoFlex TLC XL 2-layer Compression System 4x7 (in/yd) Discharge Instruction: Apply CoFlex 2-layer compression as directed. (alt for 4 layer) Compression Stockings Add-Ons Electronic Signature(s) Signed: 04/15/2021 12:35:09 PM By: Zandra Abts RN, BSN Entered By: Zandra Abts on 04/13/2021 08:07:10 -------------------------------------------------------------------------------- Wound Assessment Details Patient Name: Date of Service: [name], [name] 04/13/2021 7:30 A M Medical Record Number: [id number] Patient Account Number: [id number] Date of Birth/Sex: Treating RN: 1961-01-06 (60 y.o. 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 Wound Open Wounding Event: Surgical Injury Status: Date Acquired:  01/30/2020 Comorbid Cataracts, Lymphedema, Asthma, Congestive Heart Failure, Weeks Of Treatment: 1 History: Coronary Artery Disease, Hypertension, Type II Diabetes, Clustered Wound: No Rheumatoid Arthritis, Neuropathy Photos Photo Uploaded By: Haywood Pao on 04/13/2021 16:20:32 Wound Measurements Length: (cm) 0.5 Width: (cm) 0.3 Depth: (cm) 0.1  Area: (cm) 0.118 Volume: (cm) 0.012 % Reduction in Area: 64.2% % Reduction in Volume: 81.8% Epithelialization: Medium (34-66%) Tunneling: No Undermining: No Wound Description Classification: Full Thickness Without Exposed Support Structures Wound Margin: Distinct, outline attached Exudate Amount: Medium Exudate Type: Serosanguineous Exudate Color: red, brown Foul Odor After Cleansing: No Slough/Fibrino No Wound Bed Granulation Amount: Large (67-100%) Exposed Structure Granulation Quality: Pink Fascia Exposed: No Necrotic Amount: None Present (0%) Fat Layer (Subcutaneous Tissue) Exposed: Yes Tendon Exposed: No Muscle Exposed: No Joint Exposed: No Bone Exposed: No Treatment Notes Wound #9 (Amputation Site - Toe) Wound Laterality: Right Cleanser Peri-Wound Care Sween Lotion (Moisturizing lotion) Discharge Instruction: Apply moisturizing lotion as directed Topical Primary Dressing Hydrofera Blue Ready Foam, 2.5 x2.5 in Discharge Instruction: Apply to wound bed as instructed Secondary Dressing Woven Gauze Sponge, Non-Sterile 4x4 in Discharge Instruction: Apply over primary dressing as directed. ABD Pad, 5x9 Discharge Instruction: Apply over primary dressing as directed. Secured With Compression Wrap CoFlex TLC XL 2-layer Compression System 4x7 (in/yd) Discharge Instruction: Apply CoFlex 2-layer compression as directed. (alt for 4 layer) Compression Stockings Add-Ons Electronic Signature(s) Signed: 04/15/2021 12:35:09 PM By: Zandra Abts RN, BSN Entered By: Zandra Abts on 04/13/2021  08:05:45 -------------------------------------------------------------------------------- Vitals Details Patient Name: Date of Service: [name], [name] 04/13/2021 7:30 A M Medical Record Number: [id number] Patient Account Number: [id number] Date of Birth/Sex: Treating RN: [birth year] (60 y.o. Wynelle Link Primary Care Chasten Blaze: [name] Other Clinician: Referring Freddi Schrager: Treating Adelia Baptista/Extender: Juanna Cao in Treatment: 1 Vital Signs Time Taken: 07:54 Temperature (F): 98.2 Height (in): 62 Pulse (bpm): 83 Weight (lbs): 323 Respiratory Rate (breaths/min): 20 Body Mass Index (BMI): 59.1 Blood Pressure (mmHg): 108/67 Capillary Blood Glucose (mg/dl): 409 Reference Range: 80 - 120 mg / dl Notes glucose per pt report Electronic Signature(s) Signed: 04/15/2021 12:35:09 PM By: Zandra Abts RN, BSN Entered By: Zandra Abts on 04/13/2021 07:54:49

## 2021-04-20 ENCOUNTER — Encounter (HOSPITAL_BASED_OUTPATIENT_CLINIC_OR_DEPARTMENT_OTHER): Payer: 59 | Admitting: Physician Assistant

## 2021-04-20 ENCOUNTER — Other Ambulatory Visit: Payer: Self-pay | Admitting: Family Medicine

## 2021-04-20 ENCOUNTER — Other Ambulatory Visit: Payer: Self-pay

## 2021-04-20 DIAGNOSIS — E669 Obesity, unspecified: Secondary | ICD-10-CM

## 2021-04-20 DIAGNOSIS — E11621 Type 2 diabetes mellitus with foot ulcer: Secondary | ICD-10-CM | POA: Diagnosis not present

## 2021-04-20 DIAGNOSIS — E1169 Type 2 diabetes mellitus with other specified complication: Secondary | ICD-10-CM

## 2021-04-20 NOTE — Progress Notes (Addendum)
Depass, [name] ([id number]) Visit Report for 04/20/2021 Chief Complaint Document Details Patient Name: Date of Service: [name], [name] [name] 04/20/2021 8:30 A M Medical Record Number: [id number] Patient Account Number: [id number] Date of Birth/Sex: Treating RN: 19-May-1961 (60 y.o. [name] Primary Care Provider: [name] Other Clinician: Referring Provider: Treating Provider/Extender: Debbra Riding in Treatment: 2 Information Obtained from: Patient Chief Complaint Right foot ulcer and left perineal region Electronic Signature(s) Signed: 04/20/2021 9:45:43 AM By: Worthy Keeler PA-C Previous Signature: 04/20/2021 8:38:21 AM Version By: Worthy Keeler PA-C Entered By: Worthy Keeler on 04/20/2021 09:45:43 -------------------------------------------------------------------------------- Debridement Details Patient Name: Date of Service: [name], [name] 04/20/2021 8:30 A M Medical Record Number: [id number] Patient Account Number: [id number] Date of Birth/Sex: Treating RN: 1960-10-22 (60 y.o. [name] Primary Care Provider: [name] Other Clinician: Referring Provider: Treating Provider/Extender: Debbra Riding in Treatment: 2 Debridement Performed for Assessment: Wound #8 Right,Plantar Foot Performed By: Physician Worthy Keeler, PA Debridement Type: Debridement Severity of Tissue Pre Debridement: Fat layer exposed Level of Consciousness (Pre-procedure): Awake and Alert Pre-procedure Verification/Time Out Yes - 09:25 Taken: Start Time: 09:27 T Area Debrided (L x W): otal 2.4 (cm) x 2 (cm) = 4.8 (cm) Tissue and other material debrided: Viable, Non-Viable, Callus, Slough, Subcutaneous, Skin: Epidermis, Slough Level: Skin/Subcutaneous Tissue Debridement Description: Excisional Instrument: Curette Bleeding: Minimum Hemostasis Achieved: Pressure End Time: 09:32 Procedural Pain: 4 Post  Procedural Pain: 0 Response to Treatment: Procedure was tolerated well Level of Consciousness (Post- Awake and Alert procedure): Post Debridement Measurements of Total Wound Length: (cm) 1.8 Width: (cm) 1.4 Depth: (cm) 0.2 Volume: (cm) 0.396 Character of Wound/Ulcer Post Debridement: Improved Severity of Tissue Post Debridement: Fat layer exposed Post Procedure Diagnosis Same as Pre-procedure Electronic Signature(s) Signed: 04/20/2021 3:55:35 PM By: Baruch Gouty RN, BSN Signed: 04/21/2021 5:47:57 PM By: Worthy Keeler PA-C Entered By: Baruch Gouty on 04/20/2021 09:32:57 -------------------------------------------------------------------------------- Debridement Details Patient Name: Date of Service: [name], [name] 04/20/2021 8:30 A M Medical Record Number: [id number] Patient Account Number: [id number] Date of Birth/Sex: Treating RN: 1961-01-15 (60 y.o. [name] Primary Care Provider: [name] Other Clinician: Referring Provider: Treating Provider/Extender: Debbra Riding in Treatment: 2 Debridement Performed for Assessment: Wound #9 Right Amputation Site - Toe Performed By: Physician Worthy Keeler, PA Debridement Type: Debridement Level of Consciousness (Pre-procedure): Awake and Alert Pre-procedure Verification/Time Out Yes - 09:25 Taken: Start Time: 09:27 T Area Debrided (L x W): otal 1 (cm) x 1 (cm) = 1 (cm) Tissue and other material debrided: Viable, Non-Viable, Callus, Slough, Subcutaneous, Skin: Epidermis, Slough Level: Skin/Subcutaneous Tissue Debridement Description: Excisional Instrument: Curette Bleeding: Minimum Hemostasis Achieved: Pressure End Time: 09:32 Procedural Pain: 4 Post Procedural Pain: 0 Response to Treatment: Procedure was tolerated well Level of Consciousness (Post- Awake and Alert procedure): Post Debridement Measurements of Total Wound Length: (cm) 0.4 Width: (cm) 0.3 Depth:  (cm) 0.2 Volume: (cm) 0.019 Character of Wound/Ulcer Post Debridement: Improved Post Procedure Diagnosis Same as Pre-procedure Electronic Signature(s) Signed: 04/20/2021 3:55:35 PM By: Baruch Gouty RN, BSN Signed: 04/21/2021 5:47:57 PM By: Worthy Keeler PA-C Entered By: Baruch Gouty on 04/20/2021 09:34:43 -------------------------------------------------------------------------------- HPI Details Patient Name: Date of Service: Barbar, [name] 04/20/2021 8:30 A M Medical Record Number: [id number] Patient Account Number: [id number] Date of Birth/Sex: Treating RN: 1961-08-18 (60 y.o. [name] Primary Care Provider: Other Clinician: [name] Referring Provider: Treating Provider/Extender:  Stone III, Luiz Blare, Hart Carwin Weeks in Treatment: 2 History of Present Illness HPI Description: 07/23/2019 on evaluation today patient presents with a myriad of wounds noted at multiple locations over her right heel, right lower extremity, and abdominal region. She also has bilateral lower extremity lymphedema which is quite significant as well. Incidentally she also has congestive heart failure and hypertension. She also is obese. With that being said I think all this is contributing as well to her lower extremity edema which is very much uncontrolled. It seems like its been at least several months since she is worn any compression according to what she tells me. With that being said I am not sure exactly when that would have been. She does have fibrotic changes in the lower extremity secondary to lymphedema worse on the left than the right. She did show me pictures of wound she had on the anterior portion of her shin which was quite significant fortunately that has healed. These issues have been intermittent at this time. Again I think that with appropriate compression therapy she may actually be doing better than what we are seeing at this point but again I am not really  sure that she is ever been extremely compliant with that. Fortunately there is no signs of active infection at this time. No fever chills noted. As far as the abdominal ulcer she initially had one wound that she thinks may have been a bug bite. Subsequently she was put a dressing on this and she states that the tape pulled skin off on other locations causing other wounds that have not healed that is been about 3 months. The wounds on her lower extremities have been intermittent over 3 years. This includes the heel. 11/19; this is a patient that I have not seen previously. She was admitted to our clinic last week with multiple wounds including several superficial circular areas on her abdomen predominantly right upper quadrant. Also 1 in her umbilicus. She has an area on her right lateral malleolus which was new today. She has bilateral lower extremity edema with very significant stasis dermatitis on the left anterior tibial area. She has tightly adherent skin in her lower extremities probably secondary to cutaneous fibrosis in the area. We put her in compression last week. She comes in with the dressings reasonably saturated. 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 She has multiple areas on her arms from scratching. 12/3; the patient I saw for the first time 2 weeks ago. She has 3 small circular areas on her abdomen 2 in the right upper quadrant one on her umbilicus. We have been using silver alginate to this area. She also had an area on her right lateral malleolus and right heel and right medial malleolus. We have been using silver alginate here under compression. She does not have an open area on the left leg today. We are going to order her stockings for the left leg. She clearly has chronic lymphedema  in these areas. X-ray of the foot from 10/20 did not show any fracture or dislocation. The heel wound was noted there was no evidence of osteomyelitis She complains of generalized pruritus. 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 She has longstanding lymphedema. Although she does not have known liver disease I looked at his CT scan of the  abdomen from February of this year that showed hepatic steatosis 12/11; patient still has no open area on the left leg we transitioned her into a stocking she had. Her stockings from [place] are supposed to be arriving later today which will be the stockings of choice. The only wound remaining on the right is the right heel 2 small superficial open areas. Everything else is well on its way to healing in the right leg few small excoriations. She has 1 major area remaining on her abdomen the rest seem to be healing 12/22 patient still has no open area on the left leg and she is still in a stocking. She had still 2 open areas on the tip of her right heel. These look like pressure related areas although the patient is not really certain how this is happening. She has an open heeled shoe that we provided. Still has 1 open area on the right lateral abdomen The patient has complaints of generalized pruritus. She has multiple excoriated areas on her abdomen that of close over her arms or thighs which I think are from scratching. She tells me that she has never had a basic work-up for this which might include basic lab work, liver functions, kidney functions, thyroid etc. She might also benefit from a dermatologist 12/29; the patient has a deeper larger more painful wound on the tip of her right heel. Again these look like pressure ulcers although she wears an open toed heel pads or heel at night to prevent it from hitting the mattress etc. They tell me and remind me that this is been present on and off for about 2 years that has  closed over but then will reopen. I did look over her lab work that was available in [place]. She does not have elevated liver function tests or creatinine. I was not able to see a TSH. This was in response to her complaints of generalized itching and a itch/scratch cycle. I also noted that she is on OxyContin and oxycodone and of course narcotics can cause generalized pruritus as a side effect Readmission: History From [place], [place] Last Week: 03/29/2021 this is a patient who presents for initial evaluation here in the clinic though I have seen her 1 time previously in 2020 this was in [place]. That was actually her initial visit therefore a heel ulceration. Subsequently that did not healing she actually saw me the 1 time in November and then subsequently saw Dr. Dellia Nims through the end of December where she apparently was healed. 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 This is on the right. With that being said right now she has a basically region on the lateral portion of the ray site where she is applying more pressure especially as her foot seems to be starting to turn in and this has become an increasingly significant issue for her to be honest. She does see Dr. Sharol Given and Dr. Sharol Given has had her on doxycycline for quite a bit of time here. Subsequently she is just now wrapping that out. I think that she probably would be benefited by continue the doxycycline for a time while we can work through what we need to do with things here. She does have evidence of osteomyelitis based on what I saw on the MRI today. This obviously is unfortunate and definitely not something that she was hoping  to hear. With that being said I do believe that she would potentially be a strong candidate for hyperbaric oxygen therapy. Also believe she could be a candidate for total contact cast though we have  to be cautious due to the fact that how her foot is bending inward I think that this is good to be a little bit of a concern. We will definitely have to keeping a close eye on things. She does have a history of diabetes mellitus type 2. She also other than the amputation has a medical history positive for hypertension. She has never undergone hyperbaric oxygen therapy previously I did show her the chamber we did talk a little bit about it today as well. Admission [facility]: o 04/06/2021 Patient presents here in [place] today for evaluation. Again she is establishing care here after I saw her last week in [place]. Obviously I think that she is doing extremely well at this point which is great news and in general I am extremely pleased with where things stand from the standpoint of the appearance of the wound. Obviously this is not significantly smaller but does look a lot cleaner I think using the Hydrofera Blue has been of benefit over the past week. Nonetheless she still has a wound with issues with pressure here which I think is good to be an ongoing issue. Also think that the osteomyelitis which is chronic is also getting ongoing issue. She does want to try to do what she can to prevent this from worsening and ending with a below-knee amputation. T that end I do think that getting her into the hyperbaric oxygen chamber would be what we need to do. She has recently been seen by cardiology o and subsequently well as far as that is concerned. Her ejection fraction was appropriate for hyperbarics and everything seems to be doing great in that regard. She has not had a recent chest x-ray she is a former smoker around 15 years ago she quit. Nonetheless I do believe with her asthma we do want to do a chest x-ray just to make sure everything is okay before proceeding with the hyperbarics although we can go ahead and see about getting the approval. I also think she is going require some  sharp debridement and around this wound we also have to contact her insurance for prior approval on this as well. 04/13/2021 upon evaluation today patient appears to be doing a little bit better in regard to her leg in fact the swelling is dramatically better. Very pleased with where things stand in that regard. Fortunately there does not appear to be any signs of active infection at this time. No fevers, chills, nausea, vomiting, or diarrhea. 04/20/2021 upon evaluation today patient appears to be doing decently well in regard to her foot. There is some need for sharp debridement here today. With that being said we will get a go ahead and proceed with that today as the foot is becoming somewhat macerated with the overhanging callus that is definitely not we want to see. With that being said the patient does have an issue here as well with a boil in the perineal region unfortunately that is an issue for her today as well. I did have a look at that as well. We are still working on dealing with insurance as far as getting hyperbarics approved. Electronic Signature(s) Signed: 04/20/2021 9:45:55 AM By: Worthy Keeler PA-C Entered By: Worthy Keeler on 04/20/2021 09:45:54 -------------------------------------------------------------------------------- Physical Exam Details  Patient Name: Date of Service: [name], [name] [name] 04/20/2021 8:30 A M Medical Record Number: [id number] Patient Account Number: [id number] Date of Birth/Sex: Treating RN: [birth year] (60 y.o. [name] Primary Care Provider: [name] Other Clinician: Referring Provider: Treating Provider/Extender: Debbra Riding in Treatment: 2 Constitutional patient is hypertensive.. pulse regular and within target range for patient.Marland Kitchen respirations regular, non-labored and within target range for patient.Marland Kitchen temperature within target range for patient.. Well-nourished and well-hydrated in no acute  distress. Eyes conjunctiva clear no eyelid edema noted. pupils equal round and reactive to light and accommodation. Ears, Nose, Mouth, and Throat no gross abnormality of ear auricles or external auditory canals. normal hearing noted during conversation. mucus membranes moist. Respiratory normal breathing without difficulty. Cardiovascular 2+ dorsalis pedis/posterior tibialis pulses. no clubbing, cyanosis, significant edema, <3 sec cap refill. Musculoskeletal normal gait and posture. no significant deformity or arthritic changes, no loss or range of motion, no clubbing. Psychiatric this patient is able to make decisions and demonstrates good insight into disease process. Alert and Oriented x 3. pleasant and cooperative. Notes Upon inspection patient's wound bed actually showed signs of fairly good granulation in the base of the wound there was some need for sharp debridement today. Fortunately there does not appear to be any evidence of active infection at this time. I do feel like that sharp debridement is something that is necessary in order to try to help clear away some of the necrotic debris here. Fortunately I see no evidence of active infection locally nor systemically that seems to be worsening. She is on doxycycline which I think is helping her as far as that is concerned. Electronic Signature(s) Signed: 04/20/2021 9:46:43 AM By: Worthy Keeler PA-C Entered By: Worthy Keeler on 04/20/2021 09:46:43 -------------------------------------------------------------------------------- Physician Orders Details Patient Name: Date of Service: Heide, [name] 04/20/2021 8:30 A M Medical Record Number: [id number] Patient Account Number: [id number] Date of Birth/Sex: Treating RN: 1960-12-30 (60 y.o. [name] Primary Care Provider: [name] Other Clinician: Referring Provider: Treating Provider/Extender: Debbra Riding in Treatment: 2 Verbal /  Phone Orders: No Diagnosis Coding ICD-10 Coding Code Description E11.621 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 T81.31XA Disruption of external operation (surgical) wound, not elsewhere classified, initial encounter 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) I10 Essential (primary) hypertension Follow-up Appointments Return Appointment in 1 week. Bathing/ Shower/ Hygiene May shower with protection but do not get wound dressing(s) wet. Edema Control - Lymphedema / SCD / Other Bilateral Lower Extremities Elevate legs to the level of the heart or above for 30 minutes daily and/or when sitting, a frequency of: Avoid standing for long periods of time. Patient to wear own compression stockings every day. 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 Follow Hyperbaric Oxygen Glycemia Protocol A frin (Oxymetazoline HCL) 0.05% nasal spray - 1 spray in both nostrils daily as needed prior to HBO treatment for difficulty clearing ears Wound Treatment Wound #10 - Perineum Wound Laterality: Left Prim Dressing: KerraCel Ag Gelling Fiber Dressing, 2x2 in (  silver alginate) 1 x Per Day/30 Days ary Discharge Instructions: Apply silver alginate to wound bed as instructed Secondary Dressing: Woven Gauze Sponges 2x2 in 1 x Per Day/30 Days Discharge Instructions: Apply over primary dressing as directed. Secured With: tegaderm tranparent film dressing 2x3 in 1 x Per Day/30 Days Wound #8 - Foot Wound Laterality: Plantar, Right Peri-Wound  Care: Sween Lotion (Moisturizing lotion) 1 x Per Week/30 Days Discharge Instructions: Apply moisturizing lotion as directed Prim Dressing: KerraCel Ag Gelling Fiber Dressing, 2x2 in (silver alginate) 1 x Per Week/30 Days ary Discharge Instructions: Apply silver alginate to right dorsal foot Prim Dressing: Hydrofera Blue Ready Foam, 2.5 x2.5 in 1 x Per Week/30 Days ary Discharge Instructions: Apply to wound bed as instructed Secondary Dressing: Woven Gauze Sponge, Non-Sterile 4x4 in 1 x Per Week/30 Days Discharge Instructions: Apply over primary dressing as directed. Secondary Dressing: ABD Pad, 5x9 1 x Per Week/30 Days Discharge Instructions: Apply over primary dressing as directed. Secondary Dressing: Optifoam Non-Adhesive Dressing, 4x4 in 1 x Per Week/30 Days Discharge Instructions: Apply over primary dressing cut to form donut Compression Wrap: CoFlex TLC XL 2-layer Compression System 4x7 (in/yd) 1 x Per Week/30 Days Discharge Instructions: Apply CoFlex 2-layer compression as directed. (alt for 4 layer) Wound #9 - Amputation Site - Toe Wound Laterality: Right Peri-Wound Care: Sween Lotion (Moisturizing lotion) 1 x Per Week/30 Days Discharge Instructions: Apply moisturizing lotion as directed Prim Dressing: Hydrofera Blue Ready Foam, 2.5 x2.5 in 1 x Per Week/30 Days ary Discharge Instructions: Apply to wound bed as instructed Secondary Dressing: Woven Gauze Sponge, Non-Sterile 4x4 in 1 x Per Week/30 Days Discharge Instructions: Apply over primary dressing as directed. Secondary Dressing: ABD Pad, 5x9 1 x Per Week/30 Days Discharge Instructions: Apply over primary dressing as directed. Compression Wrap: CoFlex TLC XL 2-layer Compression System 4x7 (in/yd) 1 x Per Week/30 Days Discharge Instructions: Apply CoFlex 2-layer compression as directed. (alt for 4 layer) GLYCEMIA INTERVENTIONS PROTOCOL PRE-HBO GLYCEMIA INTERVENTIONS ACTION INTERVENTION Obtain pre-HBO capillary blood glucose  (ensure 1 physician order is in chart). A. Notify HBO physician and await physician orders. 2 If result is 70 mg/dl or below: B. If the result meets the hospital definition of a critical result, follow hospital policy. A. Give patient an 8 ounce Glucerna Shake, an 8 ounce Ensure, or 8 ounces of a Glucerna/Ensure equivalent dietary supplement*. B. Wait 30 minutes. If result is 71 mg/dl to 130 mg/dl: C. Retest patients capillary blood glucose (CBG). D. If result greater than or equal to 110 mg/dl, proceed with HBO. If result less than 110 mg/dl, notify HBO physician and consider holding HBO. If result is 131 mg/dl to 249 mg/dl: A. Proceed with HBO. A. Notify HBO physician and await physician orders. B. It is recommended to hold HBO and do If result is 250 mg/dl or greater: blood/urine ketone testing. C. If the result meets the hospital definition of a critical result, follow hospital policy. POST-HBO GLYCEMIA INTERVENTIONS ACTION INTERVENTION Obtain post HBO capillary blood glucose (ensure 1 physician order is in chart). A. Notify HBO physician and await physician orders. 2 If result is 70 mg/dl or below: B. If the result meets the hospital definition of a critical result, follow hospital policy. A. Give patient an 8 ounce Glucerna Shake, an 8 ounce Ensure, or 8 ounces of a Glucerna/Ensure equivalent dietary supplement*. B. Wait 15 minutes for symptoms of If result is 71 mg/dl to 100 mg/dl: hypoglycemia (i.e. nervousness, anxiety, sweating, chills, clamminess, irritability, confusion, tachycardia or  dizziness). C. If patient asymptomatic, discharge patient. If patient symptomatic, repeat capillary blood glucose (CBG) and notify HBO physician. If result is 101 mg/dl to 249 mg/dl: A. Discharge patient. A. Notify HBO physician and await physician orders. B. It is recommended to do blood/urine ketone If result is 250 mg/dl or greater: testing. C. If the result  meets the hospital definition of a critical result, follow hospital policy. *Juice or candies are NOT equivalent products. If patient refuses the Glucerna or Ensure, please consult the hospital dietitian for an appropriate substitute. Electronic Signature(s) Signed: 04/20/2021 3:55:35 PM By: Baruch Gouty RN, BSN Signed: 04/21/2021 5:47:57 PM By: Worthy Keeler PA-C Entered By: Baruch Gouty on 04/20/2021 13:09:49 -------------------------------------------------------------------------------- Problem List Details Patient Name: Date of Service: [name], [name] 04/20/2021 8:30 A M Medical Record Number: [id number] Patient Account Number: [id number] Date of Birth/Sex: Treating RN: [birth year] (60 y.o. 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 04/20/2021 8:30 A M Medical Record Number: [id number] Patient Account Number:  [id number] Date of Birth/Sex: Treating RN: 1960/10/17 (60 y.o. [name] Primary Care Provider: [name] Other Clinician: Referring Provider: Treating Provider/Extender: Debbra Riding in Treatment: 2 Subjective Chief Complaint Information obtained from Patient Right foot ulcer and left perineal region History of Present Illness (HPI) 07/23/2019 on evaluation today patient presents with a myriad of wounds noted at multiple locations over her right heel, right lower extremity, and abdominal region. She also has bilateral lower extremity lymphedema which is quite significant as well. Incidentally she also has congestive heart failure and hypertension. She also is obese. With that being said I think all this is contributing as well to her lower extremity edema which is very much uncontrolled. It seems like its been at least several months since she is worn any compression according to what she tells me. With that being said I am not sure exactly when that would have been. She does have fibrotic changes in the lower extremity secondary to lymphedema worse on the left than the right. She did show me pictures of wound she had on the anterior portion of her shin which was quite significant fortunately that has healed. These issues have been intermittent at this time. Again I think that with appropriate compression therapy she may actually be doing better than what we are seeing at this point but again I am not really sure that she is ever been extremely compliant with that. Fortunately there is no signs of active infection at this time. No fever chills noted. As far as the abdominal ulcer she initially had one wound that she thinks may have been a bug bite. Subsequently she was put a  dressing on this and she states that the tape pulled skin off on other locations causing other wounds that have not healed that is been about 3 months. The wounds on her lower  extremities have been intermittent over 3 years. This includes the heel. 11/19; this is a patient that I have not seen previously. She was admitted to our clinic last week with multiple wounds including several superficial circular areas on her abdomen predominantly right upper quadrant. Also 1 in her umbilicus. She has an area on her right lateral malleolus which was new today. She has bilateral lower extremity edema with very significant stasis dermatitis on the left anterior tibial area. She has tightly adherent skin in her lower extremities probably secondary to cutaneous fibrosis in the area. We put her in compression last week. She comes in with the dressings reasonably saturated. 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 She has multiple areas on her arms from scratching. 12/3; the patient I saw for the first time 2 weeks ago. She has 3 small circular areas on her abdomen 2 in the right upper quadrant one on her umbilicus. We have been using silver alginate to this area. She also had an area on her right lateral malleolus and right heel and right medial malleolus. We have been using silver alginate here under compression. She does not have an open area on the left leg today. We are going to order her stockings for the left leg. She clearly has chronic lymphedema in these areas. X-ray of the foot from 10/20 did not show any fracture or dislocation. The heel wound was noted there was no evidence of osteomyelitis She complains of generalized pruritus. 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 She has longstanding lymphedema. Although she does not have known liver disease I looked at his CT scan of the abdomen from February of this year that showed hepatic  steatosis 12/11; patient still has no open area on the left leg we transitioned her into a stocking she had. Her stockings from [employer] are supposed to be arriving later today which will be the stockings of choice. The only wound remaining on the right is the right heel 2 small superficial open areas. Everything else is well on its way to healing in the right leg few small excoriations. She has 1 major area remaining on her abdomen the rest seem to be healing 12/22 patient still has no open area on the left leg and she is still in a stocking. She had still 2 open areas on the tip of her right heel. These look like pressure related areas although the patient is not really certain how this is happening. She has an open heeled shoe that we provided. Still has 1 open area on the right lateral abdomen The patient has complaints of generalized pruritus. She has multiple excoriated areas on her abdomen that of close over her arms or thighs which I think are from scratching. She tells me that she has never had a basic work-up for this which might include basic lab work, liver functions, kidney functions, thyroid etc. She might also benefit from a dermatologist 12/29; the patient has a deeper larger more painful wound on the tip of her right heel. Again these look like pressure ulcers although she wears an open toed heel  pads or heel at night to prevent it from hitting the mattress etc. They tell me and remind me that this is been present on and off for about 2 years that has closed over but then will reopen. I did look over her lab work that was available in [place]. She does not have elevated liver function tests or creatinine. I was not able to see a TSH. This was in response to her complaints of generalized itching and a itch/scratch cycle. I also noted that she is on OxyContin and oxycodone and of course narcotics can cause generalized pruritus as a side effect Readmission: History From  [place], [place] Last Week: 03/29/2021 this is a patient who presents for initial evaluation here in the clinic though I have seen her 1 time previously in 2020 this was in [place]. That was actually her initial visit therefore a heel ulceration. Subsequently that did not healing she actually saw me the 1 time in November and then subsequently saw Dr. Dellia Nims through the end of December where she apparently was healed. 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 This is on the right. With that being said right now she has a basically region on the lateral portion of the ray site where she is applying more pressure especially as her foot seems to be starting to turn in and this has become an increasingly significant issue for her to be honest. She does see Dr. Sharol Given and Dr. Sharol Given has had her on doxycycline for quite a bit of time here. Subsequently she is just now wrapping that out. I think that she probably would be benefited by continue the doxycycline for a time while we can work through what we need to do with things here. She does have evidence of osteomyelitis based on what I saw on the MRI today. This obviously is unfortunate and definitely not something that she was hoping to hear. With that being said I do believe that she would potentially be a strong candidate for hyperbaric oxygen therapy. Also believe she could be a candidate for total contact cast though we have to be cautious due to the fact that how her foot is bending inward I think that this is good to be a little bit of a concern. We will definitely have to keeping a close eye on things. She does have a history of diabetes mellitus type 2. She also other than the amputation has a medical history positive for hypertension. She has never undergone hyperbaric oxygen therapy previously I did show her the chamber we did talk a little bit  about it today as well. Admission [facility]: o 04/06/2021 Patient presents here in [place] today for evaluation. Again she is establishing care here after I saw her last week in [place]. Obviously I think that she is doing extremely well at this point which is great news and in general I am extremely pleased with where things stand from the standpoint of the appearance of the wound. Obviously this is not significantly smaller but does look a lot cleaner I think using the Hydrofera Blue has been of benefit over the past week. Nonetheless she still has a wound with issues with pressure here which I think is good to be an ongoing issue. Also think that the osteomyelitis which is chronic is also getting ongoing issue. She does want to try  to do what she can to prevent this from worsening and ending with a below-knee amputation. T that end I do think that getting her into the hyperbaric oxygen chamber would be what we need to do. She has recently been seen by cardiology o and subsequently well as far as that is concerned. Her ejection fraction was appropriate for hyperbarics and everything seems to be doing great in that regard. She has not had a recent chest x-ray she is a former smoker around 15 years ago she quit. Nonetheless I do believe with her asthma we do want to do a chest x-ray just to make sure everything is okay before proceeding with the hyperbarics although we can go ahead and see about getting the approval. I also think she is going require some sharp debridement and around this wound we also have to contact her insurance for prior approval on this as well. 04/13/2021 upon evaluation today patient appears to be doing a little bit better in regard to her leg in fact the swelling is dramatically better. Very pleased with where things stand in that regard. Fortunately there does not appear to be any signs of active infection at this time. No fevers, chills, nausea, vomiting,  or diarrhea. 04/20/2021 upon evaluation today patient appears to be doing decently well in regard to her foot. There is some need for sharp debridement here today. With that being said we will get a go ahead and proceed with that today as the foot is becoming somewhat macerated with the overhanging callus that is definitely not we want to see. With that being said the patient does have an issue here as well with a boil in the perineal region unfortunately that is an issue for her today as well. I did have a look at that as well. We are still working on dealing with insurance as far as getting hyperbarics approved. Objective Constitutional patient is hypertensive.. pulse regular and within target range for patient.Marland Kitchen respirations regular, non-labored and within target range for patient.Marland Kitchen temperature within target range for patient.. Well-nourished and well-hydrated in no acute distress. Vitals Time Taken: 8:40 AM, Height: 62 in, Weight: 323 lbs, BMI: 59.1, Temperature: 98.0 F, Pulse: 88 bpm, Respiratory Rate: 22 breaths/min, Blood Pressure: 178/77 mmHg. Eyes conjunctiva clear no eyelid edema noted. pupils equal round and reactive to light and accommodation. Ears, Nose, Mouth, and Throat no gross abnormality of ear auricles or external auditory canals. normal hearing noted during conversation. mucus membranes moist. Respiratory normal breathing without difficulty. Cardiovascular 2+ dorsalis pedis/posterior tibialis pulses. no clubbing, cyanosis, significant edema, Musculoskeletal normal gait and posture. no significant deformity or arthritic changes, no loss or range of motion, no clubbing. Psychiatric this patient is able to make decisions and demonstrates good insight into disease process. Alert and Oriented x 3. pleasant and cooperative. General Notes: Upon inspection patient's wound bed actually showed signs of fairly good granulation in the base of the wound there was some need for  sharp debridement today. Fortunately there does not appear to be any evidence of active infection at this time. I do feel like that sharp debridement is something that is necessary in order to try to help clear away some of the necrotic debris here. Fortunately I see no evidence of active infection locally nor systemically that seems to be worsening. She is on doxycycline which I think is helping her as far as that is concerned. Integumentary (Hair, Skin) Wound #10 status is Open. Original cause of wound was Other  Lesion. The date acquired was: 01/18/2021. The wound is located on the Left Perineum. The wound measures 1.3cm length x 1cm width x 0.2cm depth; 1.021cm^2 area and 0.204cm^3 volume. There is Fat Layer (Subcutaneous Tissue) exposed. There is no tunneling or undermining noted. There is a medium amount of serosanguineous drainage noted. The wound margin is distinct with the outline attached to the wound base. There is large (67-100%) red granulation within the wound bed. There is no necrotic tissue within the wound bed. Wound #8 status is Open. Original cause of wound was Gradually Appeared. The date acquired was: 11/09/2020. The wound has been in treatment 2 weeks. The wound is located on the [place]. The wound measures 1.8cm length x 1.2cm width x 0.2cm depth; 1.696cm^2 area and 0.339cm^3 volume. There is Fat Layer (Subcutaneous Tissue) exposed. There is no tunneling noted, however, there is undermining starting at 4:00 and ending at 6:00 with a maximum distance of 0.3cm. There is a medium amount of serosanguineous drainage noted. The wound margin is well defined and not attached to the wound base. There is large (67-100%) pink granulation within the wound bed. There is no necrotic tissue within the wound bed. General Notes: Calloused Periwound Wound #9 status is Open. Original cause of wound was Surgical Injury. The date acquired was: 01/30/2020. The wound has been in treatment 2  weeks. The wound is located on the Right Amputation Site - T The wound measures 0.2cm length x 0.3cm width x 0.1cm depth; 0.047cm^2 area and 0.005cm^3 volume. oe. There is Fat Layer (Subcutaneous Tissue) exposed. There is no tunneling or undermining noted. There is a medium amount of serosanguineous drainage noted. The wound margin is distinct with the outline attached to the wound base. There is large (67-100%) pink granulation within the wound bed. There is no necrotic tissue within the wound bed. Assessment Active Problems ICD-10 Type 2 diabetes mellitus with foot ulcer Non-pressure chronic ulcer of other part of right foot with fat layer exposed Disruption of external operation (surgical) wound, not elsewhere classified, initial encounter Acquired absence of other right toe(s) Essential (primary) hypertension Cellulitis of perineum Procedures Wound #8 Pre-procedure diagnosis of Wound #8 is a Diabetic Wound/Ulcer of the Lower Extremity located on the Right,Plantar Foot .Severity of Tissue Pre Debridement is: Fat layer exposed. There was a Excisional Skin/Subcutaneous Tissue Debridement with a total area of 4.8 sq cm performed by Worthy Keeler, PA. With the following instrument(s): Curette to remove Viable and Non-Viable tissue/material. Material removed includes Callus, Subcutaneous Tissue, Slough, and Skin: Epidermis. No specimens were taken. A time out was conducted at 09:25, prior to the start of the procedure. A Minimum amount of bleeding was controlled with Pressure. The procedure was tolerated well with a pain level of 4 throughout and a pain level of 0 following the procedure. Post Debridement Measurements: 1.8cm length x 1.4cm width x 0.2cm depth; 0.396cm^3 volume. Character of Wound/Ulcer Post Debridement is improved. Severity of Tissue Post Debridement is: Fat layer exposed. Post procedure Diagnosis Wound #8: Same as Pre-Procedure Wound #9 Pre-procedure diagnosis of Wound #9  is an Open Surgical Wound located on the Right Amputation Site - T . There was a Excisional Skin/Subcutaneous oe Tissue Debridement with a total area of 1 sq cm performed by Worthy Keeler, PA. With the following instrument(s): Curette to remove Viable and Non-Viable tissue/material. Material removed includes Callus, Subcutaneous Tissue, Slough, and Skin: Epidermis. No specimens were taken. A time out was conducted at 09:25, prior to the  start of the procedure. A Minimum amount of bleeding was controlled with Pressure. The procedure was tolerated well with a pain level of 4 throughout and a pain level of 0 following the procedure. Post Debridement Measurements: 0.4cm length x 0.3cm width x 0.2cm depth; 0.019cm^3 volume. Character of Wound/Ulcer Post Debridement is improved. Post procedure Diagnosis Wound #9: Same as Pre-Procedure There was a Double Layer Compression Therapy Procedure by Deon Pilling, RN. Post procedure Diagnosis Wound #: Same as Pre-Procedure Plan Follow-up Appointments: Return Appointment in 1 week. Bathing/ Shower/ Hygiene: May shower with protection but do not get wound dressing(s) wet. Edema Control - Lymphedema / SCD / Other: Elevate legs to the level of the heart or above for 30 minutes daily and/or when sitting, a frequency of: Avoid standing for long periods of time. Patient to wear own compression stockings every day. 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 Follow Hyperbaric Oxygen Glycemia Protocol Afrin (Oxymetazoline HCL) 0.05% nasal spray - 1 spray in both nostrils daily as  needed prior to HBO treatment for difficulty clearing ears WOUND #10: - Perineum Wound Laterality: Left Prim Dressing: KerraCel Ag Gelling Fiber Dressing, 2x2 in (silver alginate) 1 x Per Day/30 Days ary Discharge Instructions: Apply silver alginate to wound bed as instructed Secondary Dressing: Woven Gauze Sponges 2x2 in 1 x Per Day/30 Days Discharge Instructions: Apply over primary dressing as directed. Secured With: tegaderm tranparent film dressing 2x3 in 1 x Per Day/30 Days WOUND #8: - Foot Wound Laterality: Plantar, Right Peri-Wound Care: Sween Lotion (Moisturizing lotion) 1 x Per Week/30 Days Discharge Instructions: Apply moisturizing lotion as directed Prim Dressing: KerraCel Ag Gelling Fiber Dressing, 2x2 in (silver alginate) 1 x Per Week/30 Days ary Discharge Instructions: Apply silver alginate to right dorsal foot Prim Dressing: Hydrofera Blue Ready Foam, 2.5 x2.5 in 1 x Per Week/30 Days ary Discharge Instructions: Apply to wound bed as instructed Secondary Dressing: Woven Gauze Sponge, Non-Sterile 4x4 in 1 x Per Week/30 Days Discharge Instructions: Apply over primary dressing as directed. Secondary Dressing: ABD Pad, 5x9 1 x Per Week/30 Days Discharge Instructions: Apply over primary dressing as directed. Secondary Dressing: Optifoam Non-Adhesive Dressing, 4x4 in 1 x Per Week/30 Days Discharge Instructions: Apply over primary dressing cut to form donut Com pression Wrap: CoFlex TLC XL 2-layer Compression System 4x7 (in/yd) 1 x Per Week/30 Days Discharge Instructions: Apply CoFlex 2-layer compression as directed. (alt for 4 layer) WOUND #9: - Amputation Site - T oe Wound Laterality: Right Peri-Wound Care: Sween Lotion (Moisturizing lotion) 1 x Per Week/30 Days Discharge Instructions: Apply moisturizing lotion as directed Prim Dressing: Hydrofera Blue Ready Foam, 2.5 x2.5 in 1 x Per Week/30 Days ary Discharge Instructions: Apply to wound bed as instructed Secondary Dressing:  Woven Gauze Sponge, Non-Sterile 4x4 in 1 x Per Week/30 Days Discharge Instructions: Apply over primary dressing as directed. Secondary Dressing: ABD Pad, 5x9 1 x Per Week/30 Days Discharge Instructions: Apply over primary dressing as directed. Com pression Wrap: CoFlex TLC XL 2-layer Compression System 4x7 (in/yd) 1 x Per Week/30 Days Discharge Instructions: Apply CoFlex 2-layer compression as directed. (alt for 4 layer) 1. Would recommend currently that we going  continue with the wound care measures as before. I think that the best option from the standpoint of the new boil was probably can to be for [place] to use a silver alginate dressing. We can use something such as a ABD pad or even a woman's feminine pad to help catch any drainage past that. This also help hold it in place to some degree with her undergarments. 2. Also can recommend at this time that we have the patient continue with the silver alginate to the foot as well which seems to be doing well in my opinion. 3. We will also continue with the 2 layer compression wrap which I feel like is doing much better. 4. We will still work on the hyperbaric oxygen therapy due to the osteomyelitis. Again the insurance is taking a bit longer than I would have hoped but nonetheless we are still working on this actively. We will see patient back for reevaluation in 1 week here in the clinic. If anything worsens or changes patient will contact our office for additional recommendations. Electronic Signature(s) Signed: 04/20/2021 3:55:35 PM By: Baruch Gouty RN, BSN Signed: 04/21/2021 5:47:57 PM By: Worthy Keeler PA-C Previous Signature: 04/20/2021 9:47:48 AM Version By: Worthy Keeler PA-C Entered By: Baruch Gouty on 04/20/2021 13:10:49 -------------------------------------------------------------------------------- SuperBill Details Patient Name: Date of Service: [name], [name] 04/20/2021 Medical Record Number: [id number] Patient Account  Number: [id number] Date of Birth/Sex: Treating RN: 1961/05/20 (60 y.o. [name] Primary Care Provider: [name] Other Clinician: Referring Provider: Treating Provider/Extender: Debbra Riding in Treatment: 2 Diagnosis Coding ICD-10 Codes Code Description [phone] 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 T81.31XA Disruption of external operation (surgical) wound, not elsewhere classified, initial encounter 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) I10 Essential (primary) hypertension L03.315 Cellulitis of perineum Facility Procedures CPT4 Code: [id number] Description: 73710 - DEB SUBQ TISSUE 20 SQ CM/< ICD-10 Diagnosis Description L97.512 Non-pressure chronic ulcer of other part of right foot with fat layer exposed T81.31XA Disruption of external operation (surgical) wound, not elsewhere classified, init Modifier: ial encounter Quantity: 1 Physician Procedures : CPT4 Code Description Modifier [id number] - WC PHYS LEVEL 4 - EST PT 25 ICD-10 Diagnosis Description E11.621 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 T81.31XA  Disruption of external operation (surgical) wound, not elsewhere classified, initial encounter L03.315 Cellulitis of perineum Quantity: 1 : [id number] 11042 - WC PHYS SUBQ TISS 20 SQ CM ICD-10 Diagnosis Description L97.512 Non-pressure chronic ulcer of other part of right foot with fat layer exposed T81.31XA Disruption of external operation (surgical) wound, not elsewhere classified, initial  encounter Quantity: 1 Electronic Signature(s) Signed: 04/20/2021 9:48:17 AM By: Worthy Keeler PA-C Entered By: Worthy Keeler on 04/20/2021 09:48:16

## 2021-04-20 NOTE — Progress Notes (Signed)
[place], [name] ([id number]) Visit Report for 04/20/2021 Arrival Information Details Patient Name: Date of Service: [name], [name] 04/20/2021 8:30 A M Medical Record Number: [id number] Patient Account Number: [id number] Date of Birth/Sex: Treating RN: 1961/04/05 (60 y.o. [name] Primary Care Jayleen Scaglione: [name] Other Clinician: Referring Zimal Weisensel: Treating Porshea Janowski/Extender: Juanna Cao in Treatment: 2 Visit Information History Since Last Visit Added or deleted any medications: No Patient Arrived: Wheel Chair Any new allergies or adverse reactions: No Arrival Time: 08:36 Had a fall or experienced change in No Accompanied By: Husband activities of daily living that may affect Transfer Assistance: None risk of falls: Patient Identification Verified: Yes Signs or symptoms of abuse/neglect since No Secondary Verification Process Completed: Yes last visito Patient Requires Transmission-Based Precautions: No Hospitalized since last visit: No Patient Has Alerts: Yes Implantable device outside of the clinic No Patient Alerts: [name] 03/2021 excluding ABI: 1.24 cellular tissue based products placed in the center since last visit: Has Dressing in Place as Prescribed: Yes Has Compression in Place as Prescribed: Yes Has Footwear/Offloading in Place as Yes Prescribed: Right: Surgical Shoe with Pressure Relief Insole Pain Present Now: No Electronic Signature(s) Signed: 04/20/2021 3:42:23 PM By: Antonieta Iba Entered By: Antonieta Iba on 04/20/2021 08:37:39 -------------------------------------------------------------------------------- Compression Therapy Details Patient Name: Date of Service: [name], [name] 04/20/2021 8:30 A M Medical Record Number: [id number] Patient Account Number: [id number] Date of Birth/Sex: Treating RN: 11-19-1960 (60 y.o. [name] Primary Care Iysis Germain: [name] Other  Clinician: Referring Media Pizzini: Treating [name]/Extender: Juanna Cao in Treatment: 2 Compression Therapy Performed for Wound Assessment: NonWound Condition Lymphedema - Right Leg Performed By: Clinician Shawn Stall, RN Compression Type: Double Layer Post Procedure Diagnosis Same as Pre-procedure Electronic Signature(s) Signed: 04/20/2021 3:55:35 PM By: Zenaida Deed RN, BSN Entered By: Zenaida Deed on 04/20/2021 09:40:54 -------------------------------------------------------------------------------- Encounter Discharge Information Details Patient Name: Date of Service: [name], [name] 04/20/2021 8:30 A M Medical Record Number: [id number] Patient Account Number: [id number] Date of Birth/Sex: Treating RN: 1961-01-03 (60 y.o. [name], [name] Primary Care Sue Fernicola: [name] Other Clinician: Referring Mayme Profeta: Treating Yanci Bachtell/Extender: Juanna Cao in Treatment: 2 Encounter Discharge Information Items Post Procedure Vitals Discharge Condition: Stable Temperature (F): 98.4 Ambulatory Status: Wheelchair Pulse (bpm): 74 Discharge Destination: Home Respiratory Rate (breaths/min): 17 Transportation: Private Auto Blood Pressure (mmHg): 147/74 Accompanied By: husband Schedule Follow-up Appointment: Yes Clinical Summary of Care: Patient Declined Electronic Signature(s) Signed: 04/20/2021 3:40:52 PM By: Fonnie Mu RN Entered By: Fonnie Mu on 04/20/2021 09:46:24 -------------------------------------------------------------------------------- Lower Extremity Assessment Details Patient Name: Date of Service: [name], [name] 04/20/2021 8:30 A M Medical Record Number: [id number] Patient Account Number: [id number] Date of Birth/Sex: Treating RN: [birth year] (60 y.o. [name] Primary Care Macklen Wilhoite: [name] Other Clinician: Referring Emeterio Balke: Treating  [name]/Extender: Juanna Cao in Treatment: 2 Edema Assessment Assessed: [Left: No] [Right: Yes] Edema: [Left: Ye] [Right: s] Calf Left: Right: Point of Measurement: 36 cm From Medial Instep 40.5 cm Ankle Left: Right: Point of Measurement: 10 cm From Medial Instep 22 cm Vascular Assessment Pulses: Dorsalis Pedis Palpable: [Right:Yes] Electronic Signature(s) Signed: 04/20/2021 3:42:23 PM By: Antonieta Iba Entered By: Antonieta Iba on 04/20/2021 08:45:54 -------------------------------------------------------------------------------- Multi Wound Chart Details Patient Name: Date of Service: [name], [name] 04/20/2021 8:30 A M Medical Record Number: [id number] Patient Account Number: [id number] Date of Birth/Sex: Treating RN: 10/11/1960 (60 y.o. [name], [name]  [name]: [name] Other Clinician: Referring Arieon Scalzo: Treating [name]/Extender: [name], [name] in Treatment: 2 Vital Signs Height(in): 62 Pulse(bpm): 88 Weight(lbs): 323 Blood Pressure(mmHg): 178/77 Body Mass Index(BMI): 59 Temperature(F): 98.0 Respiratory Rate(breaths/min): 22 Photos: Left Perineum Right, Plantar Foot Right Amputation Site - T oe Wound Location: Other Lesion Gradually Appeared Surgical Injury Wounding Event: Lesion Diabetic Wound/Ulcer of the Lower Open Surgical Wound Primary Etiology: Extremity Cataracts, Lymphedema, Asthma, Cataracts, Lymphedema, Asthma, Cataracts, Lymphedema, Asthma, Comorbid History: Congestive Heart Failure, Coronary Congestive Heart Failure, Coronary Congestive Heart Failure, Coronary Artery Disease, Hypertension, Type II Artery Disease, Hypertension, Type II Artery Disease, Hypertension, Type II Diabetes, Rheumatoid Arthritis, Diabetes, Rheumatoid Arthritis, Diabetes, Rheumatoid Arthritis, Neuropathy Neuropathy Neuropathy 01/18/2021 11/09/2020 01/30/2020 Date Acquired: 0 2 2 Weeks  of Treatment: Open Open Open Wound Status: 1.3x1x0.2 1.8x1.2x0.2 0.2x0.3x0.1 Measurements L x W x D (cm) 1.021 1.696 0.047 A (cm) : rea 0.204 0.339 0.005 Volume (cm) : N/A -19.90% 85.80% % Reduction in A rea: N/A -19.80% 92.40% % Reduction in Volume: 4 Starting Position 1 (o'clock): 6 Ending Position 1 (o'clock): 0.3 Maximum Distance 1 (cm): No Yes No Undermining: Full Thickness Without Exposed Grade 3 Full Thickness Without Exposed Classification: Support Structures Support Structures Medium Medium Medium Exudate A mount: Serosanguineous Serosanguineous Serosanguineous Exudate Type: red, brown red, brown red, brown Exudate Color: Distinct, outline attached Well defined, not attached Distinct, outline attached Wound Margin: Large (67-100%) Large (67-100%) Large (67-100%) Granulation A mount: Red Pink Pink Granulation Quality: None Present (0%) None Present (0%) None Present (0%) Necrotic A mount: Fat Layer (Subcutaneous Tissue): Yes Fat Layer (Subcutaneous Tissue): Yes Fat Layer (Subcutaneous Tissue): Yes Exposed Structures: Fascia: No Fascia: No Fascia: No Tendon: No Tendon: No Tendon: No Muscle: No Muscle: No Muscle: No Joint: No Joint: No Joint: No Bone: No Bone: No Bone: No None Small (1-33%) Medium (34-66%) Epithelialization: N/A Debridement - Excisional Debridement - Excisional Debridement: Pre-procedure Verification/Time Out N/A 09:25 09:25 Taken: N/A Callus, Subcutaneous, Slough Callus, Subcutaneous, Slough Tissue Debrided: N/A Skin/Subcutaneous Tissue Skin/Subcutaneous Tissue Level: N/A 4.8 1 Debridement A (sq cm): rea N/A Curette Curette Instrument: N/A Minimum Minimum Bleeding: N/A Pressure Pressure Hemostasis A chieved: N/A 4 4 Procedural Pain: N/A 0 0 Post Procedural Pain: N/A Procedure was tolerated well Procedure was tolerated well Debridement Treatment Response: N/A 1.8x1.4x0.2 0.4x0.3x0.2 Post Debridement  Measurements L x W x D (cm) N/A 0.396 0.019 Post Debridement Volume: (cm) N/A Calloused Periwound N/A Assessment Notes: N/A Debridement Debridement Procedures Performed: Treatment Notes Wound #10 (Perineum) Wound Laterality: Left Cleanser Peri-Wound Care Topical Primary Dressing KerraCel Ag Gelling Fiber Dressing, 2x2 in (silver alginate) Discharge Instruction: Apply silver alginate to wound bed as instructed Secondary Dressing Woven Gauze Sponge, Non-Sterile 4x4 in Discharge Instruction: Apply over primary dressing. use undergarment to to hold in place Secured With Compression Wrap Compression Stockings Add-Ons Wound #8 (Foot) Wound Laterality: Plantar, Right Cleanser Peri-Wound Care Sween Lotion (Moisturizing lotion) Discharge Instruction: Apply moisturizing lotion as directed Topical Primary Dressing KerraCel Ag Gelling Fiber Dressing, 2x2 in (silver alginate) Discharge Instruction: Apply silver alginate to right dorsal foot Hydrofera Blue Ready Foam, 2.5 x2.5 in Discharge Instruction: Apply to wound bed as instructed Secondary Dressing Woven Gauze Sponge, Non-Sterile 4x4 in Discharge Instruction: Apply over primary dressing as directed. ABD Pad, 5x9 Discharge Instruction: Apply over primary dressing as directed. Optifoam Non-Adhesive Dressing, 4x4 in Discharge Instruction: Apply over primary dressing cut to form donut Secured With Compression Wrap CoFlex TLC XL 2-layer Compression System 4x7 (in/yd) Discharge  Instruction: Apply CoFlex 2-layer compression as directed. (alt for 4 layer) Compression Stockings Add-Ons Wound #9 (Amputation Site - Toe) Wound Laterality: Right Cleanser Peri-Wound Care Sween Lotion (Moisturizing lotion) Discharge Instruction: Apply moisturizing lotion as directed Topical Primary Dressing Hydrofera Blue Ready Foam, 2.5 x2.5 in Discharge Instruction: Apply to wound bed as instructed Secondary Dressing Woven Gauze Sponge,  Non-Sterile 4x4 in Discharge Instruction: Apply over primary dressing as directed. ABD Pad, 5x9 Discharge Instruction: Apply over primary dressing as directed. Secured With Compression Wrap CoFlex TLC XL 2-layer Compression System 4x7 (in/yd) Discharge Instruction: Apply CoFlex 2-layer compression as directed. (alt for 4 layer) Compression Stockings Add-Ons Electronic Signature(s) Signed: 04/20/2021 3:55:35 PM By: Zenaida Deed RN, BSN Entered By: Zenaida Deed on 04/20/2021 13:10:22 -------------------------------------------------------------------------------- Multi-Disciplinary Care Plan Details Patient Name: Date of Service: Suits, [name] 04/20/2021 8:30 A M Medical Record Number: [id number] Patient Account Number: [id number] Date of Birth/Sex: Treating RN: [birth year] (60 y.o. [name] Primary Care Jeneane Pieczynski: [name] Other Clinician: Referring Davan Nawabi: Treating Trevious Rampey/Extender: Juanna Cao in Treatment: 2 Multidisciplinary Care Plan reviewed with physician Active Inactive HBO Nursing Diagnoses: Potential for barotraumas to ears, sinuses, teeth, and lungs or cerebral gas embolism related to changes in atmospheric pressure inside hyperbaric oxygen chamber Potential for oxygen toxicity seizures related to delivery of 100% oxygen at an increased atmospheric pressure Potential for pulmonary oxygen toxicity related to delivery of 100% oxygen at an increased atmospheric pressure Goals: Patient will tolerate the hyperbaric oxygen therapy treatment Date Initiated: 04/06/2021 Target Resolution Date: 05/04/2021 Goal Status: Active Patient/caregiver will verbalize understanding of HBO goals, rationale, procedures and potential hazards Date Initiated: 04/06/2021 Target Resolution Date: 05/04/2021 Goal Status: Active Interventions: Administer a five (5) minute air break for patient if signs and symptoms of seizure appear and  notify the hyperbaric physician Assess and provide for patients comfort related to the hyperbaric environment and equalization of middle ear Assess for signs and symptoms related to adverse events, including but not limited to confinement anxiety, pneumothorax, oxygen toxicity and baurotrauma Notes: Nutrition Nursing Diagnoses: Impaired glucose control: actual or potential Potential for alteratiion in Nutrition/Potential for imbalanced nutrition Goals: Patient/caregiver will maintain therapeutic glucose control Date Initiated: 04/06/2021 Target Resolution Date: 05/04/2021 Goal Status: Active Interventions: Assess HgA1c results as ordered upon admission and as needed Assess patient nutrition upon admission and as needed per policy Provide education on elevated blood sugars and impact on wound healing Treatment Activities: Patient referred to Primary Care Physician for further nutritional evaluation : 04/06/2021 Notes: Wound/Skin Impairment Nursing Diagnoses: Impaired tissue integrity Knowledge deficit related to ulceration/compromised skin integrity Goals: Patient/caregiver will verbalize understanding of skin care regimen Date Initiated: 04/06/2021 Target Resolution Date: 05/04/2021 Goal Status: Active Ulcer/skin breakdown will have a volume reduction of 30% by week 4 Date Initiated: 04/06/2021 Target Resolution Date: 05/04/2021 Goal Status: Active Interventions: Assess patient/caregiver ability to obtain necessary supplies Assess patient/caregiver ability to perform ulcer/skin care regimen upon admission and as needed Assess ulceration(s) every visit Provide education on ulcer and skin care Treatment Activities: Skin care regimen initiated : 04/06/2021 Topical wound management initiated : 04/06/2021 Notes: Electronic Signature(s) Signed: 04/20/2021 3:55:35 PM By: Zenaida Deed RN, BSN Entered By: Zenaida Deed on 04/20/2021  09:19:21 -------------------------------------------------------------------------------- Pain Assessment Details Patient Name: Date of Service: Seifert, [name] 04/20/2021 8:30 A M Medical Record Number: [id number] Patient Account Number: [id number] Date of Birth/Sex: Treating RN: September 10, 1961 (60 y.o. [name] Primary Care Romey Mathieson: [name] Other Clinician: Referring Ronal Maybury:  Treating Teasia Zapf/Extender: Mayford Knife Weeks in Treatment: 2 Active Problems Location of Pain Severity and Description of Pain Patient Has Paino No Site Locations Pain Management and Medication Current Pain Management: Electronic Signature(s) Signed: 04/20/2021 3:42:23 PM By: Antonieta Iba Entered By: Antonieta Iba on 04/20/2021 08:41:42 -------------------------------------------------------------------------------- Patient/Caregiver Education Details Patient Name: Date of Service: Tosh, [name] 8/10/2022andnbsp8:30 A M Medical Record Number: [id number] Patient Account Number: [id number] Date of Birth/Gender: Treating RN: [birth year] (60 y.o. [name] Primary Care Physician: [name] Other Clinician: Referring Physician: Treating Physician/Extender: Juanna Cao in Treatment: 2 Education Assessment Education Provided To: Patient Education Topics Provided Hyperbaric Oxygenation: Methods: Explain/Verbal Responses: Reinforcements needed, State content correctly Offloading: Methods: Explain/Verbal Responses: Reinforcements needed, State content correctly Wound/Skin Impairment: Methods: Explain/Verbal Responses: Reinforcements needed, State content correctly Electronic Signature(s) Signed: 04/20/2021 3:55:35 PM By: Zenaida Deed RN, BSN Entered By: Zenaida Deed on 04/20/2021 09:19:59 -------------------------------------------------------------------------------- Wound Assessment  Details Patient Name: Date of Service: Bynum, [name] 04/20/2021 8:30 A M Medical Record Number: [id number] Patient Account Number: [id number] Date of Birth/Sex: Treating RN: April 22, 1961 (60 y.o. [name] Primary Care Demetre Monaco: [name] Other Clinician: Referring Foster Frericks: Treating Chelsey Kimberley/Extender: Juanna Cao in Treatment: 2 Wound Status Wound Number: 10 Primary Lesion Etiology: Wound Location: Left Perineum Wound Open Wounding Event: Other Lesion Status: Date Acquired: 01/18/2021 Comorbid Cataracts, Lymphedema, Asthma, Congestive Heart Failure, Weeks Of Treatment: 0 History: Coronary Artery Disease, Hypertension, Type II Diabetes, Clustered Wound: No Rheumatoid Arthritis, Neuropathy Photos Wound Measurements Length: (cm) 1.3 Width: (cm) 1 Depth: (cm) 0.2 Area: (cm) 1.021 Volume: (cm) 0.204 % Reduction in Area: % Reduction in Volume: Epithelialization: None Tunneling: No Undermining: No Wound Description Classification: Full Thickness Without Exposed Support Structures Wound Margin: Distinct, outline attached Exudate Amount: Medium Exudate Type: Serosanguineous Exudate Color: red, brown Foul Odor After Cleansing: No Slough/Fibrino No Wound Bed Granulation Amount: Large (67-100%) Exposed Structure Granulation Quality: Red Fascia Exposed: No Necrotic Amount: None Present (0%) Fat Layer (Subcutaneous Tissue) Exposed: Yes Tendon Exposed: No Muscle Exposed: No Joint Exposed: No Bone Exposed: No Treatment Notes Wound #10 (Perineum) Wound Laterality: Left Cleanser Peri-Wound Care Topical Primary Dressing KerraCel Ag Gelling Fiber Dressing, 2x2 in (silver alginate) Discharge Instruction: Apply silver alginate to wound bed as instructed Secondary Dressing Woven Gauze Sponge, Non-Sterile 4x4 in Discharge Instruction: Apply over primary dressing. use undergarment to to hold in place Secured With Compression  Wrap Compression Stockings Add-Ons Electronic Signature(s) Signed: 04/20/2021 3:42:23 PM By: Antonieta Iba Entered By: Antonieta Iba on 04/20/2021 09:01:27 -------------------------------------------------------------------------------- Wound Assessment Details Patient Name: Date of Service: Kirtz, [name] 04/20/2021 8:30 A M Medical Record Number: [id number] Patient Account Number: [id number] Date of Birth/Sex: Treating RN: Nov 16, 1960 (60 y.o. [name] Primary Care Syeda Prickett: [name] Other Clinician: Referring Darika Ildefonso: Treating Aycen Porreca/Extender: Juanna Cao in Treatment: 2 Wound Status Wound Number: 8 Primary Diabetic Wound/Ulcer of the Lower Extremity Etiology: Wound Location: Right, Plantar Foot Wound Open Wounding Event: Gradually Appeared Status: Date Acquired: 11/09/2020 Comorbid Cataracts, Lymphedema, Asthma, Congestive Heart Failure, Weeks Of Treatment: 2 History: Coronary Artery Disease, Hypertension, Type II Diabetes, Clustered Wound: No Rheumatoid Arthritis, Neuropathy Photos Wound Measurements Length: (cm) 1.8 Width: (cm) 1.2 Depth: (cm) 0.2 Area: (cm) 1.696 Volume: (cm) 0.339 % Reduction in Area: -19.9% % Reduction in Volume: -19.8% Epithelialization: Small (1-33%) Tunneling: No Undermining: Yes Starting Position (o'clock): 4 Ending Position (o'clock): 6 Maximum Distance: (cm) 0.3 Wound  Description Classification: Grade 3 Wound Margin: Well defined, not attached Exudate Amount: Medium Exudate Type: Serosanguineous Exudate Color: red, brown Foul Odor After Cleansing: No Slough/Fibrino No Wound Bed Granulation Amount: Large (67-100%) Exposed Structure Granulation Quality: Pink Fascia Exposed: No Necrotic Amount: None Present (0%) Fat Layer (Subcutaneous Tissue) Exposed: Yes Tendon Exposed: No Muscle Exposed: No Joint Exposed: No Bone Exposed: No Assessment Notes Calloused  Periwound Treatment Notes Wound #8 (Foot) Wound Laterality: Plantar, Right Cleanser Peri-Wound Care Sween Lotion (Moisturizing lotion) Discharge Instruction: Apply moisturizing lotion as directed Topical Primary Dressing KerraCel Ag Gelling Fiber Dressing, 2x2 in (silver alginate) Discharge Instruction: Apply silver alginate to right dorsal foot Hydrofera Blue Ready Foam, 2.5 x2.5 in Discharge Instruction: Apply to wound bed as instructed Secondary Dressing Woven Gauze Sponge, Non-Sterile 4x4 in Discharge Instruction: Apply over primary dressing as directed. ABD Pad, 5x9 Discharge Instruction: Apply over primary dressing as directed. Optifoam Non-Adhesive Dressing, 4x4 in Discharge Instruction: Apply over primary dressing cut to form donut Secured With Compression Wrap CoFlex TLC XL 2-layer Compression System 4x7 (in/yd) Discharge Instruction: Apply CoFlex 2-layer compression as directed. (alt for 4 layer) Compression Stockings Add-Ons Electronic Signature(s) Signed: 04/20/2021 3:42:23 PM By: Antonieta Iba Entered By: Antonieta Iba on 04/20/2021 08:52:33 -------------------------------------------------------------------------------- Wound Assessment Details Patient Name: Date of Service: [place], [name] 04/20/2021 8:30 A M Medical Record Number: [id number] Patient Account Number: [id number] Date of Birth/Sex: Treating RN: [birth year] (60 y.o. [name] Primary Care Abdul Beirne: [name] Other Clinician: Referring Marja Adderley: Treating Markesia Crilly/Extender: Juanna Cao in Treatment: 2 Wound Status Wound Number: 9 Primary Open Surgical Wound Etiology: Wound Location: Right Amputation Site - Toe Wound Open Wounding Event: Surgical Injury Status: Date Acquired: 01/30/2020 Comorbid Cataracts, Lymphedema, Asthma, Congestive Heart Failure, Weeks Of Treatment: 2 History: Coronary Artery Disease, Hypertension, Type II  Diabetes, Clustered Wound: No Clustered Wound: No Rheumatoid Arthritis, Neuropathy Photos Wound Measurements Length: (cm) 0.2 Width: (cm) 0.3 Depth: (cm) 0.1 Area: (cm) 0.047 Volume: (cm) 0.005 % Reduction in Area: 85.8% % Reduction in Volume: 92.4% Epithelialization: Medium (34-66%) Tunneling: No Undermining: No Wound Description Classification: Full Thickness Without Exposed Support Structures Wound Margin: Distinct, outline attached Exudate Amount: Medium Exudate Type: Serosanguineous Exudate Color: red, brown Foul Odor After Cleansing: No Slough/Fibrino No Wound Bed Granulation Amount: Large (67-100%) Exposed Structure Granulation Quality: Pink Fascia Exposed: No Necrotic Amount: None Present (0%) Fat Layer (Subcutaneous Tissue) Exposed: Yes Tendon Exposed: No Muscle Exposed: No Joint Exposed: No Bone Exposed: No Treatment Notes Wound #9 (Amputation Site - Toe) Wound Laterality: Right Cleanser Peri-Wound Care Sween Lotion (Moisturizing lotion) Discharge Instruction: Apply moisturizing lotion as directed Topical Primary Dressing Hydrofera Blue Ready Foam, 2.5 x2.5 in Discharge Instruction: Apply to wound bed as instructed Secondary Dressing Woven Gauze Sponge, Non-Sterile 4x4 in Discharge Instruction: Apply over primary dressing as directed. ABD Pad, 5x9 Discharge Instruction: Apply over primary dressing as directed. Secured With Compression Wrap CoFlex TLC XL 2-layer Compression System 4x7 (in/yd) Discharge Instruction: Apply CoFlex 2-layer compression as directed. (alt for 4 layer) Compression Stockings Add-Ons Electronic Signature(s) Signed: 04/20/2021 3:42:23 PM By: Antonieta Iba Entered By: Antonieta Iba on 04/20/2021 08:54:03 -------------------------------------------------------------------------------- Vitals Details Patient Name: Date of Service: Pieri, [name] 04/20/2021 8:30 A M Medical Record Number: [id number] Patient Account Number:  [id number] Date of Birth/Sex: Treating RN: Sep 26, 1960 (60 y.o. [name] Primary Care Rhya Shan: [name] Other Clinician: Referring Achilles Neville: Treating Casee Knepp/Extender: Juanna Cao in Treatment: 2 Vital Signs Time  Taken: 08:40 Temperature (F): 98.0 Height (in): 62 Pulse (bpm): 88 Weight (lbs): 323 Respiratory Rate (breaths/min): 22 Body Mass Index (BMI): 59.1 Blood Pressure (mmHg): 178/77 Reference Range: 80 - 120 mg / dl Electronic Signature(s) Signed: 04/20/2021 3:42:23 PM By: Antonieta Iba Entered By: Antonieta Iba on 04/20/2021 08:41:32

## 2021-04-27 ENCOUNTER — Encounter (HOSPITAL_BASED_OUTPATIENT_CLINIC_OR_DEPARTMENT_OTHER): Payer: 59 | Admitting: Physician Assistant

## 2021-04-27 ENCOUNTER — Other Ambulatory Visit: Payer: Self-pay

## 2021-04-27 NOTE — Progress Notes (Signed)
Monahan, NALAYAH HITT ([id number]) Visit Report for 04/27/2021 Allergy List Details Patient Name: Date of Service: Negro, [name] 04/27/2021 7:30 A M Medical Record Number: [id number] Patient Account Number: [id number] Date of Birth/Sex: Treating RN: Aug 05, 1961 (60 y.o. [name] Primary Care Venetta Knee: [name] Other Clinician: Referring Ishan Sanroman: Treating Samhita Kretsch/Extender: Debbra Riding in Treatment: 3 Allergies Active Allergies fish oil Fish Containing Products Crestor lovastatin metronidazole Red Yeast Rice ([place] Purpureus) rotigotine Atrovent Suprep Bowel Prep Kit adhesive tape Betadine peanut Severity: Severe Dial soap Reaction: itching Type: Allergen Allergy Notes Electronic Signature(s) Signed: 04/27/2021 5:25:21 PM By: Deon Pilling Entered By: Deon Pilling on 04/27/2021 11:50:28 -------------------------------------------------------------------------------- Arrival Information Details Patient Name: Date of Service: Burget, [name] 04/27/2021 7:30 A M Medical Record Number: [id number] Patient Account Number: [id number] Date of Birth/Sex: Treating RN: 1961/02/25 (60 y.o. [name], [name] Primary Care Drayke Grabel: [name] Other Clinician: Referring Derreon Consalvo: Treating Kaleiah Kutzer/Extender: Debbra Riding in Treatment: 3 Visit Information Patient Arrived: Wheel Chair Arrival Time: 07:42 Accompanied By: husband Transfer Assistance: None Patient Identification Verified: Yes Secondary Verification Process Completed: Yes Patient Requires Transmission-Based Precautions: No Patient Has Alerts: Yes Patient Alerts: [facility] 03/2021 ABI: 1.24 History Since Last Visit Added or deleted any medications: No Any new allergies or adverse reactions: No Had a fall or experienced change in activities of daily living that may affect risk of falls: No Signs or symptoms of abuse/neglect  since last visito No Hospitalized since last visit: No Implantable device outside of the clinic excluding cellular tissue based products placed in the center since last visit: No Has Dressing in Place as Prescribed: Yes Pain Present Now: No Electronic Signature(s) Signed: 04/27/2021 5:20:51 PM By: Rhae Hammock RN Entered By: Rhae Hammock on 04/27/2021 07:43:14 -------------------------------------------------------------------------------- Compression Therapy Details Patient Name: Date of Service: Nickell, [name] 04/27/2021 7:30 A M Medical Record Number: [id number] Patient Account Number: [id number] Date of Birth/Sex: Treating RN: 08-31-1961 (60 y.o. [name] Primary Care Surya Folden: [name] Other Clinician: Referring Ramiel Forti: Treating Nur Krasinski/Extender: Debbra Riding in Treatment: 3 Compression Therapy Performed for Wound Assessment: Wound #11 Right,Dorsal Foot Performed By: Clinician Deon Pilling, RN Compression Type: Double Layer Post Procedure Diagnosis Same as Pre-procedure Electronic Signature(s) Signed: 04/27/2021 5:25:21 PM By: Deon Pilling Entered By: Deon Pilling on 04/27/2021 08:13:31 -------------------------------------------------------------------------------- Compression Therapy Details Patient Name: Date of Service: Symes, [name] 04/27/2021 7:30 A M Medical Record Number: [id number] Patient Account Number: [id number] Date of Birth/Sex: Treating RN: Sep 18, 1960 (60 y.o. [name] Primary Care Shebra Muldrow: [name] Other Clinician: Referring Shantrice Rodenberg: Treating Ande Therrell/Extender: Debbra Riding in Treatment: 3 Compression Therapy Performed for Wound Assessment: Wound #8 Right,Plantar Foot Performed By: Clinician Deon Pilling, RN Compression Type: Double Layer Post Procedure Diagnosis Same as Pre-procedure Electronic Signature(s) Signed: 04/27/2021 5:25:21  PM By: Deon Pilling Entered By: Deon Pilling on 04/27/2021 08:13:31 -------------------------------------------------------------------------------- Compression Therapy Details Patient Name: Date of Service: Blackley, [name] 04/27/2021 7:30 A M Medical Record Number: [id number] Patient Account Number: [id number] Date of Birth/Sex: Treating RN: Jun 16, 1961 (60 y.o. [name] Primary Care Jakeya Gherardi: [name] Other Clinician: Referring Maxamilian Amadon: Treating Jackson Fetters/Extender: Debbra Riding in Treatment: 3 Compression Therapy Performed for Wound Assessment: Wound #9 Right Amputation Site - Toe Performed By: Clinician Deon Pilling, RN Compression Type: Double Layer Post Procedure Diagnosis Same as Pre-procedure Electronic Signature(s) Signed: 04/27/2021 5:25:21 PM By: Deon Pilling Entered  By: Deon Pilling on 04/27/2021 08:13:31 -------------------------------------------------------------------------------- Encounter Discharge Information Details Patient Name: Date of Service: Wilczynski, [name] 04/27/2021 7:30 A M Medical Record Number: [id number] Patient Account Number: [id number] Date of Birth/Sex: Treating RN: 11-Sep-1961 (60 y.o. [name] Primary Care Marlos Carmen: [name] Other Clinician: Referring Haruye Lainez: Treating Jaiceon Collister/Extender: Clydie Braun, Archie Patten in Treatment: 3 Encounter Discharge Information Items Discharge Condition: Stable Ambulatory Status: Wheelchair Discharge Destination: Home Transportation: Private Auto Accompanied By: husband Schedule Follow-up Appointment: Yes Clinical Summary of Care: Electronic Signature(s) Signed: 04/27/2021 5:25:21 PM By: Deon Pilling Entered By: Deon Pilling on 04/27/2021 08:17:16 -------------------------------------------------------------------------------- Lower Extremity Assessment Details Patient Name: Date of Service: Simien, [name]  04/27/2021 7:30 A M Medical Record Number: [id number] Patient Account Number: [id number] Date of Birth/Sex: Treating RN: Mar 10, 1961 (60 y.o. [name], [name] Primary Care Tameia Rafferty: [name] Other Clinician: Referring Gloristine Turrubiates: Treating Finnegan Gatta/Extender: Debbra Riding in Treatment: 3 Edema Assessment Assessed: [Left: No] [Right: Yes] Edema: [Left: Ye] [Right: s] Calf Left: Right: Point of Measurement: 36 cm From Medial Instep 42.5 cm Ankle Left: Right: Point of Measurement: 10 cm From Medial Instep 25 cm Vascular Assessment Pulses: Dorsalis Pedis Palpable: [Right:Yes] Posterior Tibial Palpable: [Right:Yes] Electronic Signature(s) Signed: 04/27/2021 5:20:51 PM By: Rhae Hammock RN Entered By: Rhae Hammock on 04/27/2021 07:47:07 -------------------------------------------------------------------------------- [place] Details Patient Name: Date of Service: Stiehl, [name] 04/27/2021 7:30 A M Medical Record Number: [id number] Patient Account Number: [id number] Date of Birth/Sex: Treating RN: April 27, 1961 (60 y.o. Helene Shoe, Meta.Reding Primary Care Kevonna Nolte: [name] Other Clinician: Referring Priyana Mccarey: Treating Davis Ambrosini/Extender: Debbra Riding in Treatment: 3 [place] reviewed with physician Active Inactive HBO Nursing Diagnoses: Potential for barotraumas to ears, sinuses, teeth, and lungs or cerebral gas embolism related to changes in atmospheric pressure inside hyperbaric oxygen chamber Potential for oxygen toxicity seizures related to delivery of 100% oxygen at an increased atmospheric pressure Potential for pulmonary oxygen toxicity related to delivery of 100% oxygen at an increased atmospheric pressure Goals: Patient will tolerate the hyperbaric oxygen therapy treatment Date Initiated: 04/06/2021 Target Resolution Date: 05/04/2021 Goal Status:  Active Patient/caregiver will verbalize understanding of HBO goals, rationale, procedures and potential hazards Date Initiated: 04/06/2021 Target Resolution Date: 05/04/2021 Goal Status: Active Interventions: Administer a five (5) minute air break for patient if signs and symptoms of seizure appear and notify the hyperbaric physician Assess and provide for patients comfort related to the hyperbaric environment and equalization of middle ear Assess for signs and symptoms related to adverse events, including but not limited to confinement anxiety, pneumothorax, oxygen toxicity and baurotrauma Notes: Nutrition Nursing Diagnoses: Impaired glucose control: actual or potential Potential for alteratiion in Nutrition/Potential for imbalanced nutrition Goals: Patient/caregiver will maintain therapeutic glucose control Date Initiated: 04/06/2021 Target Resolution Date: 05/04/2021 Goal Status: Active Interventions: Assess HgA1c results as ordered upon admission and as needed Assess patient nutrition upon admission and as needed per policy Provide education on elevated blood sugars and impact on wound healing Treatment Activities: Patient referred to Primary Care Physician for further nutritional evaluation : 04/06/2021 Notes: Wound/Skin Impairment Nursing Diagnoses: Impaired tissue integrity Knowledge deficit related to ulceration/compromised skin integrity Goals: Patient/caregiver will verbalize understanding of skin care regimen Date Initiated: 04/06/2021 Target Resolution Date: 05/04/2021 Goal Status: Active Ulcer/skin breakdown will have a volume reduction of 30% by week 4 Date Initiated: 04/06/2021 Target Resolution Date: 05/04/2021 Goal Status: Active Interventions: Assess patient/caregiver ability to obtain necessary supplies Assess  patient/caregiver ability to perform ulcer/skin care regimen upon admission and as needed Assess ulceration(s) every visit Provide education on ulcer and  skin care Treatment Activities: Skin care regimen initiated : 04/06/2021 Topical wound management initiated : 04/06/2021 Notes: Electronic Signature(s) Signed: 04/27/2021 5:25:21 PM By: Deon Pilling Entered By: Deon Pilling on 04/27/2021 08:07:41 -------------------------------------------------------------------------------- Pain Assessment Details Patient Name: Date of Service: Golebiewski, [name] 04/27/2021 7:30 A M Medical Record Number: [id number] Patient Account Number: [id number] Date of Birth/Sex: Treating RN: 02-25-1961 (60 y.o. [name], [name] Primary Care Deyjah Kindel: [name] Other Clinician: Referring Alecsander Hattabaugh: Treating Luvia Orzechowski/Extender: Debbra Riding in Treatment: 3 Active Problems Location of Pain Severity and Description of Pain Patient Has Paino Yes Site Locations Pain Location: Pain Location: Pain in Ulcers With Dressing Change: Yes Duration of the Pain. Constant / Intermittento Intermittent Rate the pain. Current Pain Level: 6 Worst Pain Level: 10 Least Pain Level: 0 Tolerable Pain Level: 6 Character of Pain Describe the Pain: Aching Pain Management and Medication Current Pain Management: Medication: Yes Cold Application: No Rest: Yes Massage: No Activity: No T.E.N.S.: No Heat Application: No Leg drop or elevation: No Is the Current Pain Management Adequate: Adequate How does your wound impact your activities of daily livingo Sleep: No Bathing: No Appetite: No Relationship With Others: No Bladder Continence: No Emotions: No Bowel Continence: No Work: No Toileting: No Drive: No Dressing: No Hobbies: No Electronic Signature(s) Signed: 04/27/2021 5:20:51 PM By: Rhae Hammock RN Entered By: Rhae Hammock on 04/27/2021 07:44:04 -------------------------------------------------------------------------------- Patient/Caregiver Education Details Patient Name: Date of Service: Bentsen, [name]  8/17/2022andnbsp7:30 A M Medical Record Number: [id number] Patient Account Number: [id number] Date of Birth/Gender: Treating RN: [birth year] (60 y.o. [name] Primary Care Physician: [name] Other Clinician: Referring Physician: Treating Physician/Extender: Debbra Riding in Treatment: 3 Education Assessment Education Provided To: Patient Education Topics Provided Elevated Blood Sugar/ Impact on Healing: Handouts: Elevated Blood Sugars: How Do They Affect Wound Healing Methods: Explain/Verbal Responses: Reinforcements needed Venous: Handouts: Controlling Swelling with Multilayered Compression Wraps Methods: Explain/Verbal Responses: Reinforcements needed Electronic Signature(s) Signed: 04/27/2021 5:25:21 PM By: Deon Pilling Entered By: Deon Pilling on 04/27/2021 08:07:58 -------------------------------------------------------------------------------- Wound Assessment Details Patient Name: Date of Service: Pitter, [name] 04/27/2021 7:30 A M Medical Record Number: [id number] Patient Account Number: [id number] Date of Birth/Sex: Treating RN: 07-10-1961 (60 y.o. Helene Shoe, Meta.Reding Primary Care Sonia Stickels: [name] Other Clinician: Referring Everette Mall: Treating Margorie Renner/Extender: Debbra Riding in Treatment: 3 Wound Status Wound Number: 10 Primary Lesion Etiology: Wound Location: Left Perineum Wound Open Wounding Event: Other Lesion Status: Date Acquired: 01/18/2021 Comorbid Cataracts, Lymphedema, Asthma, Congestive Heart Failure, Weeks Of Treatment: 1 History: Coronary Artery Disease, Hypertension, Type II Diabetes, Clustered Wound: No Rheumatoid Arthritis, Neuropathy Photos Wound Measurements Length: (cm) 1 Width: (cm) 1 Depth: (cm) 0.1 Area: (cm) 0.785 Volume: (cm) 0.079 % Reduction in Area: 23.1% % Reduction in Volume: 61.3% Epithelialization: None Tunneling: No Undermining:  No Wound Description Classification: Full Thickness Without Exposed Support Structures Wound Margin: Distinct, outline attached Exudate Amount: Medium Exudate Type: Serosanguineous Exudate Color: red, brown Foul Odor After Cleansing: No Slough/Fibrino No Wound Bed Granulation Amount: Large (67-100%) Exposed Structure Granulation Quality: Red, Pink, Hyper-granulation Fascia Exposed: No Necrotic Amount: None Present (0%) Fat Layer (Subcutaneous Tissue) Exposed: Yes Tendon Exposed: No Muscle Exposed: No Joint Exposed: No Bone Exposed: No Treatment Notes Wound #10 (Perineum) Wound Laterality: Left Cleanser Peri-Wound Care Topical Primary Dressing KerraCel  Ag Gelling Fiber Dressing, 2x2 in (silver alginate) Discharge Instruction: Apply silver alginate to wound bed as instructed Secondary Dressing Woven Gauze Sponges 2x2 in Discharge Instruction: Apply over primary dressing as directed. Secured With tegaderm tranparent film dressing 2x3 in Compression Wrap Compression Stockings Add-Ons Electronic Signature(s) Signed: 04/27/2021 5:25:21 PM By: Deon Pilling Entered By: Deon Pilling on 04/27/2021 07:52:20 -------------------------------------------------------------------------------- Wound Assessment Details Patient Name: Date of Service: Lagerquist, [name] 04/27/2021 7:30 A M Medical Record Number: [id number] Patient Account Number: [id number] Date of Birth/Sex: Treating RN: 1960-11-30 (60 y.o. [name], [name] Primary Care Lanay Zinda: [name] Other Clinician: Referring Messiyah Waterson: Treating Torina Ey/Extender: Debbra Riding in Treatment: 3 Wound Status Wound Number: 11 Primary Diabetic Wound/Ulcer of the Lower Extremity Etiology: Wound Location: Right, Dorsal Foot Wound Open Wounding Event: Gradually Appeared Status: Date Acquired: 04/27/2021 Comorbid Cataracts, Lymphedema, Asthma, Congestive Heart Failure, Weeks Of Treatment:  0 History: Coronary Artery Disease, Hypertension, Type II Diabetes, Clustered Wound: No Rheumatoid Arthritis, Neuropathy Photos Wound Measurements Length: (cm) 1.5 Width: (cm) 2.8 Depth: (cm) 0.1 Area: (cm) 3.299 Volume: (cm) 0.33 % Reduction in Area: 0% % Reduction in Volume: 0% Epithelialization: None Tunneling: No Undermining: No Wound Description Classification: Grade 2 Wound Margin: Distinct, outline attached Exudate Amount: Medium Exudate Type: Serosanguineous Exudate Color: red, brown Foul Odor After Cleansing: No Slough/Fibrino Yes Wound Bed Granulation Amount: Medium (34-66%) Exposed Structure Granulation Quality: Red, Pink Fascia Exposed: No Necrotic Amount: Medium (34-66%) Fat Layer (Subcutaneous Tissue) Exposed: No Necrotic Quality: Adherent Slough Tendon Exposed: No Muscle Exposed: No Joint Exposed: No Bone Exposed: No Treatment Notes Wound #11 (Foot) Wound Laterality: Dorsal, Right Cleanser Soap and Water Discharge Instruction: May shower and wash wound with dial antibacterial soap and water prior to dressing change. Wound Cleanser Discharge Instruction: Cleanse the wound with wound cleanser prior to applying a clean dressing using gauze sponges, not tissue or cotton balls. Peri-Wound Care Zinc Oxide Ointment 30g tube Discharge Instruction: Apply Zinc Oxide to periwound with each dressing change Topical Primary Dressing KerraCel Ag Gelling Fiber Dressing, 2x2 in (silver alginate) Discharge Instruction: Apply silver alginate to wound bed as instructed Secondary Dressing Woven Gauze Sponge, Non-Sterile 4x4 in Discharge Instruction: Apply over primary dressing as directed. Secured With Compression Wrap CoFlex TLC XL 2-layer Compression System 4x7 (in/yd) Discharge Instruction: Apply CoFlex 2-layer compression as directed. (alt for 4 layer) Compression Stockings Add-Ons Electronic Signature(s) Signed: 04/27/2021 5:20:51 PM By: Rhae Hammock  RN Signed: 04/27/2021 5:25:21 PM By: Deon Pilling Entered By: Deon Pilling on 04/27/2021 07:53:09 -------------------------------------------------------------------------------- Wound Assessment Details Patient Name: Date of Service: Brownley, [name] 04/27/2021 7:30 A M Medical Record Number: [id number] Patient Account Number: [id number] Date of Birth/Sex: Treating RN: 09/02/1961 (60 y.o. [name], [name] Primary Care Mccartney Brucks: [name] Other Clinician: Referring Torie Towle: Treating Tanaja Ganger/Extender: Debbra Riding in Treatment: 3 Wound Status Wound Number: 8 Primary Diabetic Wound/Ulcer of the Lower Extremity Etiology: Wound Location: Right, Plantar Foot Wound Open Wounding Event: Gradually Appeared Status: Date Acquired: 11/09/2020 Date Acquired: 11/09/2020 Comorbid Cataracts, Lymphedema, Asthma, Congestive Heart Failure, Weeks Of Treatment: 3 History: Coronary Artery Disease, Hypertension, Type II Diabetes, Clustered Wound: No Rheumatoid Arthritis, Neuropathy Photos Wound Measurements Length: (cm) 1.8 Width: (cm) 1.3 Depth: (cm) 0.2 Area: (cm) 1.838 Volume: (cm) 0.368 % Reduction in Area: -30% % Reduction in Volume: -30% Epithelialization: Small (1-33%) Tunneling: No Undermining: No Wound Description Classification: Grade 3 Wound Margin: Well defined, not attached Exudate Amount: Medium Exudate Type: Serosanguineous  Exudate Color: red, brown Foul Odor After Cleansing: No Slough/Fibrino No Wound Bed Granulation Amount: Large (67-100%) Exposed Structure Granulation Quality: Pink Fascia Exposed: No Necrotic Amount: None Present (0%) Fat Layer (Subcutaneous Tissue) Exposed: Yes Tendon Exposed: No Muscle Exposed: No Joint Exposed: No Bone Exposed: No Treatment Notes Wound #8 (Foot) Wound Laterality: Plantar, Right Cleanser Peri-Wound Care Sween Lotion (Moisturizing lotion) Discharge Instruction: Apply moisturizing  lotion as directed Topical Primary Dressing KerraCel Ag Gelling Fiber Dressing, 2x2 in (silver alginate) Discharge Instruction: Apply silver alginate. Secondary Dressing Woven Gauze Sponge, Non-Sterile 4x4 in Discharge Instruction: Apply over primary dressing as directed. ABD Pad, 5x9 Discharge Instruction: Apply over primary dressing as directed. Optifoam Non-Adhesive Dressing, 4x4 in Discharge Instruction: Apply over primary dressing cut to form donut Secured With Compression Wrap CoFlex TLC XL 2-layer Compression System 4x7 (in/yd) Discharge Instruction: Apply CoFlex 2-layer compression as directed. (alt for 4 layer) Compression Stockings Add-Ons Electronic Signature(s) Signed: 04/27/2021 5:20:51 PM By: Rhae Hammock RN Signed: 04/27/2021 5:25:21 PM By: Deon Pilling Entered By: Deon Pilling on 04/27/2021 07:54:09 -------------------------------------------------------------------------------- Wound Assessment Details Patient Name: Date of Service: [place]pen, [name] 04/27/2021 7:30 A M Medical Record Number: [id number] Patient Account Number: [id number] Date of Birth/Sex: Treating RN: 04-26-1961 (60 y.o. Helene Shoe, Meta.Reding Primary Care Loc Feinstein: [name] Other Clinician: Referring Nikeisha Klutz: Treating Farrie Sann/Extender: Debbra Riding in Treatment: 3 Wound Status Wound Number: 9 Primary Open Surgical Wound Etiology: Wound Location: Right Amputation Site - Toe Wound Open Wounding Event: Surgical Injury Status: Date Acquired: 01/30/2020 Comorbid Cataracts, Lymphedema, Asthma, Congestive Heart Failure, Weeks Of Treatment: 3 History: Coronary Artery Disease, Hypertension, Type II Diabetes, Clustered Wound: No Rheumatoid Arthritis, Neuropathy Photos Wound Measurements Length: (cm) 0.1 Width: (cm) 0.1 Depth: (cm) 0.1 Area: (cm) 0.008 Volume: (cm) 0.001 % Reduction in Area: 97.6% % Reduction in Volume: 98.5% Epithelialization:  Large (67-100%) Tunneling: No Undermining: No Wound Description Classification: Full Thickness Without Exposed Support Structures Wound Margin: Distinct, outline attached Exudate Amount: Medium Exudate Type: Serosanguineous Exudate Color: red, brown Foul Odor After Cleansing: No Slough/Fibrino No Wound Bed Granulation Amount: Large (67-100%) Exposed Structure Granulation Quality: Pink Fascia Exposed: No Necrotic Amount: None Present (0%) Fat Layer (Subcutaneous Tissue) Exposed: Yes Tendon Exposed: No Muscle Exposed: No Joint Exposed: No Bone Exposed: No Treatment Notes Wound #9 (Amputation Site - Toe) Wound Laterality: Right Cleanser Peri-Wound Care Sween Lotion (Moisturizing lotion) Discharge Instruction: Apply moisturizing lotion as directed Topical Primary Dressing KerraCel Ag Gelling Fiber Dressing, 2x2 in (silver alginate) Discharge Instruction: Apply silver alginate to wound bed as instructed Secondary Dressing Woven Gauze Sponge, Non-Sterile 4x4 in Discharge Instruction: Apply over primary dressing as directed. ABD Pad, 5x9 Discharge Instruction: Apply over primary dressing as directed. Secured With Compression Wrap CoFlex TLC XL 2-layer Compression System 4x7 (in/yd) Discharge Instruction: Apply CoFlex 2-layer compression as directed. (alt for 4 layer) Compression Stockings Add-Ons Electronic Signature(s) Signed: 04/27/2021 5:25:21 PM By: Deon Pilling Entered By: Deon Pilling on 04/27/2021 08:12:39 -------------------------------------------------------------------------------- Vitals Details Patient Name: Date of Service: Nebel, [name] 04/27/2021 7:30 A M Medical Record Number: [id number] Patient Account Number: [id number] Date of Birth/Sex: Treating RN: 03/12/1961 (60 y.o. [name], [name] Primary Care Shawan Corella: [name] Other Clinician: Referring Carlen Rebuck: Treating Ac Colan/Extender: Debbra Riding in  Treatment: 3 Vital Signs Time Taken: 07:43 Temperature (F): 98.1 Height (in): 62 Pulse (bpm): 88 Weight (lbs): 323 Respiratory Rate (breaths/min): 17 Body Mass Index (BMI): 59.1 Blood Pressure (mmHg): 109/66 Capillary Blood Glucose (  mg/dl): 61 Reference Range: 80 - 120 mg / dl Electronic Signature(s) Signed: 04/27/2021 5:20:51 PM By: Rhae Hammock RN Entered By: Rhae Hammock on 04/27/2021 07:43:37

## 2021-04-28 NOTE — Progress Notes (Addendum)
Ege, DEVLIN MCVEIGH ([id number]) Visit Report for 04/27/2021 Chief Complaint Document Details Patient Name: Date of Service: [name], [name] [name] 04/27/2021 7:30 A M Medical Record Number: [id number] Patient Account Number: [id number] Date of Birth/Sex: Treating RN: 04/19/1961 (60 y.o. F) Primary Care Provider: [name] Other Clinician: Referring Provider: Treating Provider/Extender: Debbra Riding in Treatment: 3 Information Obtained from: Patient Chief Complaint Right foot ulcer and left perineal region Electronic Signature(s) Signed: 04/27/2021 7:59:23 AM By: Worthy Keeler PA-C Entered By: Worthy Keeler on 04/27/2021 07:59:23 -------------------------------------------------------------------------------- HPI Details Patient Name: Date of Service: [name], [name] 04/27/2021 7:30 A M Medical Record Number: [id number] Patient Account Number: [id number] Date of Birth/Sex: Treating RN: [birth year] (60 y.o. F) Primary Care Provider: [name] Other Clinician: Referring Provider: Treating Provider/Extender: Debbra Riding in Treatment: 3 History of Present Illness HPI Description: 07/23/2019 on evaluation today patient presents with a myriad of wounds noted at multiple locations over her right heel, right lower extremity, and abdominal region. She also has bilateral lower extremity lymphedema which is quite significant as well. Incidentally she also has congestive heart failure and hypertension. She also is obese. With that being said I think all this is contributing as well to her lower extremity edema which is very much uncontrolled. It seems like its been at least several months since she is worn any compression according to what she tells me. With that being said I am not sure exactly when that would have been. She does have fibrotic changes in the lower extremity secondary to lymphedema worse on the left than the  right. She did show me pictures of wound she had on the anterior portion of her shin which was quite significant fortunately that has healed. These issues have been intermittent at this time. Again I think that with appropriate compression therapy she may actually be doing better than what we are seeing at this point but again I am not really sure that she is ever been extremely compliant with that. Fortunately there is no signs of active infection at this time. No fever chills noted. As far as the abdominal ulcer she initially had one wound that she thinks may have been a bug bite. Subsequently she was put a dressing on this and she states that the tape pulled skin off on other locations causing other wounds that have not healed that is been about 3 months. The wounds on her lower extremities have been intermittent over 3 years. This includes the heel. 11/19; this is a patient that I have not seen previously. She was admitted to our clinic last week with multiple wounds including several superficial circular areas on her abdomen predominantly right upper quadrant. Also 1 in her umbilicus. She has an area on her right lateral malleolus which was new today. She has bilateral lower extremity edema with very significant stasis dermatitis on the left anterior tibial area. She has tightly adherent skin in her lower extremities probably secondary to cutaneous fibrosis in the area. We put her in compression last week. She comes in with the dressings reasonably saturated. 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 She has multiple areas on her arms from scratching. 12/3; the patient I saw for the first time 2 weeks ago. She has 3 small circular areas on her abdomen  2 in the right upper quadrant one on her umbilicus.  We have been using silver alginate to this area. She also had an area on her right lateral malleolus and right heel and right medial malleolus. We have been using silver alginate here under compression. She does not have an open area on the left leg today. We are going to order her stockings for the left leg. She clearly has chronic lymphedema in these areas. X-ray of the foot from 10/20 did not show any fracture or dislocation. The heel wound was noted there was no evidence of osteomyelitis She complains of generalized pruritus. 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 She has longstanding lymphedema. Although she does not have known liver disease I looked at his CT scan of the abdomen from February of this year that showed hepatic steatosis 12/11; patient still has no open area on the left leg we transitioned her into a stocking she had. Her stockings from [place] are supposed to be arriving later today which will be the stockings of choice. The only wound remaining on the right is the right heel 2 small superficial open areas. Everything else is well on its way to healing in the right leg few small excoriations. She has 1 major area remaining on her abdomen the rest seem to be healing 12/22 patient still has no open area on the left leg and she is still in a stocking. She had still 2 open areas on the tip of her right heel. These look like pressure related areas although the patient is not really certain how this is happening. She has an open heeled shoe that we provided. Still has 1 open area on the right lateral abdomen The patient has complaints of generalized pruritus. She has multiple excoriated areas on her abdomen that of close over her arms or thighs which I think are from scratching. She tells me that she has never had a basic work-up for this which might include basic lab work, liver functions, kidney functions, thyroid etc. She might also benefit  from a dermatologist 12/29; the patient has a deeper larger more painful wound on the tip of her right heel. Again these look like pressure ulcers although she wears an open toed heel pads or heel at night to prevent it from hitting the mattress etc. They tell me and remind me that this is been present on and off for about 2 years that has closed over but then will reopen. I did look over her lab work that was available in [place]. She does not have elevated liver function tests or creatinine. I was not able to see a TSH. This was in response to her complaints of generalized itching and a itch/scratch cycle. I also noted that she is on OxyContin and oxycodone and of course narcotics can cause generalized pruritus as a side effect Readmission: History From [place], [place] Last Week: 03/29/2021 this is a patient who presents for initial evaluation here in the clinic though I have seen her 1 time previously in 2020 this was in [place]. That was actually her initial visit therefore a heel ulceration. Subsequently that did not healing she actually saw me the 1 time in November and then subsequently saw Dr. Dellia Nims through the end of December where she apparently was healed. 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 This is on the right. With that being said right now she has a basically region on the lateral portion of the ray site where she is applying more pressure especially as her foot seems to be starting to turn in and this has become an increasingly significant issue for her to be honest. She does see Dr. Sharol Given and Dr. Sharol Given has had her on doxycycline for quite a bit of time here. Subsequently she is just now wrapping that out. I think that she probably would be benefited by continue the doxycycline for a time while we can work through what we need to do with things here. She does  have evidence of osteomyelitis based on what I saw on the MRI today. This obviously is unfortunate and definitely not something that she was hoping to hear. With that being said I do believe that she would potentially be a strong candidate for hyperbaric oxygen therapy. Also believe she could be a candidate for total contact cast though we have to be cautious due to the fact that how her foot is bending inward I think that this is good to be a little bit of a concern. We will definitely have to keeping a close eye on things. She does have a history of diabetes mellitus type 2. She also other than the amputation has a medical history positive for hypertension. She has never undergone hyperbaric oxygen therapy previously I did show her the chamber we did talk a little bit about it today as well. Admission [facility]: o 04/06/2021 Patient presents here in [place] today for evaluation. Again she is establishing care here after I saw her last week in [place]. Obviously I think that she is doing extremely well at this point which is great news and in general I am extremely pleased with where things stand from the standpoint of the appearance of the wound. Obviously this is not significantly smaller but does look a lot cleaner I think using the Hydrofera Blue has been of benefit over the past week. Nonetheless she still has a wound with issues with pressure here which I think is good to be an ongoing issue. Also think that the osteomyelitis which is chronic is also getting ongoing issue. She does want to try to do what she can to prevent this from worsening and ending with a below-knee amputation. T that end I do think that getting her into the hyperbaric oxygen chamber would be what we need to do. She has recently been seen by cardiology o and subsequently well as far as that is concerned. Her ejection fraction was appropriate for hyperbarics and everything seems to be doing great in that  regard. She has not had a recent chest x-ray she is a former smoker around 15 years ago she quit. Nonetheless I do believe with her asthma we do want to do a chest x-ray just to make sure everything is okay before proceeding with the hyperbarics although we can go ahead and see about getting the approval. I also think she is going require some sharp debridement and around this wound we also have to contact her insurance for prior approval on this as well. 04/13/2021 upon evaluation today patient appears to be doing a little bit better in regard to her leg in fact the swelling is dramatically better. Very pleased with where things stand in that regard. Fortunately there does not appear to be any signs of active infection at this time. No fevers, chills, nausea,  vomiting, or diarrhea. 04/20/2021 upon evaluation today patient appears to be doing decently well in regard to her foot. There is some need for sharp debridement here today. With that being said we will get a go ahead and proceed with that today as the foot is becoming somewhat macerated with the overhanging callus that is definitely not we want to see. With that being said the patient does have an issue here as well with a boil in the perineal region unfortunately that is an issue for her today as well. I did have a look at that as well. We are still working on dealing with insurance as far as getting hyperbarics approved. 04/27/2021 upon evaluation today patient appears to be doing somewhat poorly in general compared to where she has been. At this point she is having a lot of swelling which is the main concerning thing that I am seeing. There does not appear to be any signs of infection currently which is good news but at the same time I do feel like that she is a lot more swollen than she was even last week and is showing how much she is weeping in regard to the right lower leg. She also has a blister on the left breast although is not an open  wound at this time. She continues to have the area in the perineum as well. Electronic Signature(s) Signed: 04/27/2021 8:22:44 AM By: Worthy Keeler PA-C Entered By: Worthy Keeler on 04/27/2021 08:22:44 -------------------------------------------------------------------------------- Physical Exam Details Patient Name: Date of Service: [name], [name] 04/27/2021 7:30 A M Medical Record Number: [id number] Patient Account Number: [id number] Date of Birth/Sex: Treating RN: [birth year] (60 y.o. F) Primary Care Provider: [name] Other Clinician: Referring Provider: Treating Provider/Extender: Debbra Riding in Treatment: 3 Constitutional Obese and well-hydrated in no acute distress. Respiratory normal breathing without difficulty. Psychiatric this patient is able to make decisions and demonstrates good insight into disease process. Alert and Oriented x 3. pleasant and cooperative. Notes Upon inspection patient appears to be doing decently well in regard to her wounds although she has a lot of weeping and drainage in and around the foot area there is no signs of infection nothing is erythematous or warm to touch. She is been doing well with the doxycycline currently. Electronic Signature(s) Signed: 04/27/2021 8:23:19 AM By: Worthy Keeler PA-C Entered By: Worthy Keeler on 04/27/2021 08:23:19 -------------------------------------------------------------------------------- Physician Orders Details Patient Name: Date of Service: Iversen, [name] 04/27/2021 7:30 A M Medical Record Number: [id number] Patient Account Number: [id number] Date of Birth/Sex: Treating RN: 1961/03/22 (60 y.o. [name] Primary Care Provider: [name] Other Clinician: Referring Provider: Treating Provider/Extender: Debbra Riding in Treatment: 3 Verbal / Phone Orders: No Diagnosis Coding ICD-10 Coding Code Description E11.621  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 T81.31XA Disruption of external operation (surgical) wound, not elsewhere classified, initial encounter 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) L03.315 Cellulitis of perineum L98.492 Non-pressure chronic ulcer of skin of other sites with fat layer exposed I10 Essential (primary) hypertension Follow-up Appointments ppointment in 1 week. - with Margarita Grizzle Return A Call primary care provider today to be seen for evaluation related to fluid overload, regularly need to be taking diuretics (lasix), and leg cramping with diuretics. May need blood work. Bathing/ Shower/ Hygiene May shower with protection but do not get wound dressing(s)  wet. Edema Control - Lymphedema / SCD / Other Bilateral Lower Extremities Elevate legs to the level of the heart or above for 30 minutes daily and/or when sitting, a frequency of: Avoid standing for long periods of time. Patient to wear own compression stockings every day. - left leg daily Exercise regularly Additional Orders / Instructions Other: - Ensure to take your diuretic regularly related to fluid overload. 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 Follow Hyperbaric Oxygen Glycemia Protocol A frin (Oxymetazoline HCL) 0.05% nasal spray - 1 spray in both nostrils daily as needed prior to HBO treatment for difficulty clearing ears Wound Treatment Wound #10 - Perineum Wound Laterality: Left Prim Dressing: KerraCel Ag Gelling Fiber Dressing, 2x2 in (silver alginate) 1 x Per Day/30  Days ary Discharge Instructions: Apply silver alginate to wound bed as instructed Secondary Dressing: Woven Gauze Sponges 2x2 in 1 x Per Day/30 Days Discharge Instructions: Apply over primary dressing as directed. Secured With: tegaderm tranparent film dressing 2x3 in 1 x Per Day/30 Days Wound #11 - Foot Wound Laterality: Dorsal, Right Cleanser: Soap and Water 1 x Per Week/30 Days Discharge Instructions: May shower and wash wound with dial antibacterial soap and water prior to dressing change. Cleanser: Wound Cleanser 1 x Per Week/30 Days Discharge Instructions: Cleanse the wound with wound cleanser prior to applying a clean dressing using gauze sponges, not tissue or cotton balls. Peri-Wound Care: Zinc Oxide Ointment 30g tube 1 x Per Week/30 Days Discharge Instructions: Apply Zinc Oxide to periwound with each dressing change Prim Dressing: KerraCel Ag Gelling Fiber Dressing, 2x2 in (silver alginate) 1 x Per Week/30 Days ary Discharge Instructions: Apply silver alginate to wound bed as instructed Secondary Dressing: Woven Gauze Sponge, Non-Sterile 4x4 in 1 x Per Week/30 Days Discharge Instructions: Apply over primary dressing as directed. Compression Wrap: CoFlex TLC XL 2-layer Compression System 4x7 (in/yd) 1 x Per Week/30 Days Discharge Instructions: Apply CoFlex 2-layer compression as directed. (alt for 4 layer) Wound #8 - Foot Wound Laterality: Plantar, Right Peri-Wound Care: Sween Lotion (Moisturizing lotion) 1 x Per Week/30 Days Discharge Instructions: Apply moisturizing lotion as directed Prim Dressing: KerraCel Ag Gelling Fiber Dressing, 2x2 in (silver alginate) ary 1 x Per Week/30 Days Discharge Instructions: Apply silver alginate. Secondary Dressing: Woven Gauze Sponge, Non-Sterile 4x4 in 1 x Per Week/30 Days Discharge Instructions: Apply over primary dressing as directed. Secondary Dressing: ABD Pad, 5x9 1 x Per Week/30 Days Discharge Instructions: Apply over primary dressing  as directed. Secondary Dressing: Optifoam Non-Adhesive Dressing, 4x4 in 1 x Per Week/30 Days Discharge Instructions: Apply over primary dressing cut to form donut Compression Wrap: CoFlex TLC XL 2-layer Compression System 4x7 (in/yd) 1 x Per Week/30 Days Discharge Instructions: Apply CoFlex 2-layer compression as directed. (alt for 4 layer) Wound #9 - Amputation Site - Toe Wound Laterality: Right Peri-Wound Care: Sween Lotion (Moisturizing lotion) 1 x Per Week/30 Days Discharge Instructions: Apply moisturizing lotion as directed Prim Dressing: KerraCel Ag Gelling Fiber Dressing, 2x2 in (silver alginate) 1 x Per Week/30 Days ary Discharge Instructions: Apply silver alginate to wound bed as instructed Secondary Dressing: Woven Gauze Sponge, Non-Sterile 4x4 in 1 x Per Week/30 Days Discharge Instructions: Apply over primary  dressing as directed. Secondary Dressing: ABD Pad, 5x9 1 x Per Week/30 Days Discharge Instructions: Apply over primary dressing as directed. Compression Wrap: CoFlex TLC XL 2-layer Compression System 4x7 (in/yd) 1 x Per Week/30 Days Discharge Instructions: Apply CoFlex 2-layer compression as directed. (alt for 4 layer) Patient Medications llergies: peanut, fish oil, Fish Containing Products, Crestor, lovastatin, metronidazole, Red Yeast Rice (Monascus Purpureus), rotigotine, Atrovent, Suprep A Bowel Prep Kit, adhesive tape, Betadine Notifications Medication Indication Start End 04/27/2021 doxycycline hyclate DOSE 1 - oral 100 mg capsule - 1 capsule oral taken 2 times per day for 14 days GLYCEMIA INTERVENTIONS PROTOCOL PRE-HBO GLYCEMIA INTERVENTIONS ACTION INTERVENTION Obtain pre-HBO capillary blood glucose (ensure 1 physician order is in chart). A. Notify HBO physician and await physician orders. 2 If result is 70 mg/dl or below: B. If the result meets the hospital definition of a critical result, follow hospital policy. A. Give patient an 8 ounce Glucerna Shake,  an 8 ounce Ensure, or 8 ounces of a Glucerna/Ensure equivalent dietary supplement*. B. Wait 30 minutes. If result is 71 mg/dl to 130 mg/dl: C. Retest patients capillary blood glucose (CBG). D. If result greater than or equal to 110 mg/dl, proceed with HBO. If result less than 110 mg/dl, notify HBO physician and consider holding HBO. If result is 131 mg/dl to 249 mg/dl: A. Proceed with HBO. A. Notify HBO physician and await physician orders. B. It is recommended to hold HBO and do If result is 250 mg/dl or greater: blood/urine ketone testing. C. If the result meets the hospital definition of a critical result, follow hospital policy. POST-HBO GLYCEMIA INTERVENTIONS ACTION INTERVENTION Obtain post HBO capillary blood glucose (ensure 1 physician order is in chart). A. Notify HBO physician and await physician orders. 2 If result is 70 mg/dl or below: B. If the result meets the hospital definition of a critical result, follow hospital policy. A. Give patient an 8 ounce Glucerna Shake, an 8 ounce Ensure, or 8 ounces of a Glucerna/Ensure equivalent dietary supplement*. B. Wait 15 minutes for symptoms of If result is 71 mg/dl to 100 mg/dl: hypoglycemia (i.e. nervousness, anxiety, sweating, chills, clamminess, irritability, confusion, tachycardia or dizziness). C. If patient asymptomatic, discharge patient. If patient symptomatic, repeat capillary blood glucose (CBG) and notify HBO physician. If result is 101 mg/dl to 249 mg/dl: A. Discharge patient. A. Notify HBO physician and await physician orders. B. It is recommended to do blood/urine ketone If result is 250 mg/dl or greater: testing. C. If the result meets the hospital definition of a critical result, follow hospital policy. *Juice or candies are NOT equivalent products. If patient refuses the Glucerna or Ensure, please consult the hospital dietitian for an appropriate substitute. Electronic Signature(s) Signed:  04/27/2021 8:24:51 AM By: Worthy Keeler PA-C Entered By: Worthy Keeler on 04/27/2021 08:24:50 -------------------------------------------------------------------------------- Problem List Details Patient Name: Date of Service: [name], [name] 04/27/2021 7:30 A M Medical Record Number: [id number] Patient Account Number: [id number] Date of Birth/Sex: Treating RN: [birth year] (60 y.o. F) Primary Care Provider: [name] Other Clinician: Referring Provider: Treating Provider/Extender: Debbra Riding in Treatment: 3 Active Problems ICD-10 Encounter Code Description Active Date MDM Diagnosis E11.621 Type 2 diabetes mellitus with foot ulcer 04/06/2021 No Yes L97.512 Non-pressure chronic ulcer of other part of right foot with fat layer exposed 04/06/2021 No Yes T81.31XA Disruption of external operation (surgical) wound, not elsewhere classified, 04/06/2021 No Yes initial encounter 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) 04/06/2021 No Yes L03.315  Cellulitis of perineum 04/20/2021 No Yes L98.492 Non-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 04/27/2021 7:30 A M Medical Record Number: [id number] Patient Account Number: [id number] Date of Birth/Sex: Treating RN: 27-May-1961 (60 y.o. F) Primary Care Provider: [name] Other Clinician: Referring Provider: Treating Provider/Extender: Debbra Riding in Treatment: 3 Subjective Chief  Complaint Information obtained from Patient Right foot ulcer and left perineal region History of Present Illness (HPI) 07/23/2019 on evaluation today patient presents with a myriad of wounds noted at multiple locations over her right heel, right lower extremity, and abdominal region. She also has bilateral lower extremity lymphedema which is quite significant as well. Incidentally she also has congestive heart failure and hypertension. She also is obese. With that being said I think all this is contributing as well to her lower extremity edema which is very much uncontrolled. It seems like its been at least several months since she is worn any compression according to what she tells me. With that being said I am not sure exactly when that would have been. She does have fibrotic changes in the lower extremity secondary to lymphedema worse on the left than the right. She did show me pictures of wound she had on the anterior portion of her shin which was quite significant fortunately that has healed. These issues have been intermittent at this time. Again I think that with appropriate compression therapy she may actually be doing better than what we are seeing at this point but again I am not really sure that she is ever been extremely compliant with that. Fortunately there is no signs of active infection at this time. No fever chills noted. As far as the abdominal ulcer she initially had one wound that she thinks may have been a bug bite. Subsequently she was put a dressing on this and she states that the tape pulled skin off on other locations causing other wounds that have not healed that is been about 3 months. The wounds on her lower extremities have been intermittent over 3 years. This includes the heel. 11/19; this is a patient that I have not seen previously. She was admitted to our clinic last week with multiple wounds including several superficial circular areas on her abdomen predominantly  right upper quadrant. Also 1 in her umbilicus. She has an area on her right lateral malleolus which was new today. She has bilateral lower extremity edema with very significant stasis dermatitis on the left anterior tibial area. She has tightly adherent skin in her lower extremities probably secondary to cutaneous fibrosis in the area. We put her in compression last week. She comes in with the dressings reasonably saturated. 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 She has multiple areas on her arms from scratching. 12/3; the patient I saw for the first time 2 weeks ago. She has 3 small circular areas on her abdomen 2 in the right upper quadrant one on her umbilicus. We have been using silver alginate  to this area. She also had an area on her right lateral malleolus and right heel and right medial malleolus. We have been using silver alginate here under compression. She does not have an open area on the left leg today. We are going to order her stockings for the left leg. She clearly has chronic lymphedema in these areas. X-ray of the foot from 10/20 did not show any fracture or dislocation. The heel wound was noted there was no evidence of osteomyelitis She complains of generalized pruritus. 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 She has longstanding lymphedema. Although she does not have known liver disease I looked at his CT scan of the abdomen from February of this year that showed hepatic steatosis 12/11; patient still has no open area on the left leg we transitioned her into a stocking she had. Her stockings from [place] are supposed to be arriving later today which will be the stockings of choice. The only wound remaining on the right is the right heel 2  small superficial open areas. Everything else is well on its way to healing in the right leg few small excoriations. She has 1 major area remaining on her abdomen the rest seem to be healing 12/22 patient still has no open area on the left leg and she is still in a stocking. She had still 2 open areas on the tip of her right heel. These look like pressure related areas although the patient is not really certain how this is happening. She has an open heeled shoe that we provided. Still has 1 open area on the right lateral abdomen The patient has complaints of generalized pruritus. She has multiple excoriated areas on her abdomen that of close over her arms or thighs which I think are from scratching. She tells me that she has never had a basic work-up for this which might include basic lab work, liver functions, kidney functions, thyroid etc. She might also benefit from a dermatologist 12/29; the patient has a deeper larger more painful wound on the tip of her right heel. Again these look like pressure ulcers although she wears an open toed heel pads or heel at night to prevent it from hitting the mattress etc. They tell me and remind me that this is been present on and off for about 2 years that has closed over but then will reopen. I did look over her lab work that was available in [place]. She does not have elevated liver function tests or creatinine. I was not able to see a TSH. This was in response to her complaints of generalized itching and a itch/scratch cycle. I also noted that she is on OxyContin and oxycodone and of course narcotics can cause generalized pruritus as a side effect Readmission: History From [place], [place] Last Week: 03/29/2021 this is a patient who presents for initial evaluation here in the clinic though I have seen her 1 time previously in 2020 this was in [place]. That was actually her initial visit therefore a heel ulceration. Subsequently that did not healing  she actually saw me the 1 time in November and then subsequently saw Dr. Dellia Nims through the end of December where she apparently was healed. 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 This is on the right. With that being said right now she has a basically  region on the lateral portion of the ray site where she is applying more pressure especially as her foot seems to be starting to turn in and this has become an increasingly significant issue for her to be honest. She does see Dr. Sharol Given and Dr. Sharol Given has had her on doxycycline for quite a bit of time here. Subsequently she is just now wrapping that out. I think that she probably would be benefited by continue the doxycycline for a time while we can work through what we need to do with things here. She does have evidence of osteomyelitis based on what I saw on the MRI today. This obviously is unfortunate and definitely not something that she was hoping to hear. With that being said I do believe that she would potentially be a strong candidate for hyperbaric oxygen therapy. Also believe she could be a candidate for total contact cast though we have to be cautious due to the fact that how her foot is bending inward I think that this is good to be a little bit of a concern. We will definitely have to keeping a close eye on things. She does have a history of diabetes mellitus type 2. She also other than the amputation has a medical history positive for hypertension. She has never undergone hyperbaric oxygen therapy previously I did show her the chamber we did talk a little bit about it today as well. Admission [facility]: o 04/06/2021 Patient presents here in [place] today for evaluation. Again she is establishing care here after I saw her last week in [place]. Obviously I think that she is doing extremely well at this point which is  great news and in general I am extremely pleased with where things stand from the standpoint of the appearance of the wound. Obviously this is not significantly smaller but does look a lot cleaner I think using the Hydrofera Blue has been of benefit over the past week. Nonetheless she still has a wound with issues with pressure here which I think is good to be an ongoing issue. Also think that the osteomyelitis which is chronic is also getting ongoing issue. She does want to try to do what she can to prevent this from worsening and ending with a below-knee amputation. T that end I do think that getting her into the hyperbaric oxygen chamber would be what we need to do. She has recently been seen by cardiology o and subsequently well as far as that is concerned. Her ejection fraction was appropriate for hyperbarics and everything seems to be doing great in that regard. She has not had a recent chest x-ray she is a former smoker around 15 years ago she quit. Nonetheless I do believe with her asthma we do want to do a chest x-ray just to make sure everything is okay before proceeding with the hyperbarics although we can go ahead and see about getting the approval. I also think she is going require some sharp debridement and around this wound we also have to contact her insurance for prior approval on this as well. 04/13/2021 upon evaluation today patient appears to be doing a little bit better in regard to her leg in fact the swelling is dramatically better. Very pleased with where things stand in that regard. Fortunately there does not appear to be any signs of active infection at this time. No fevers, chills, nausea, vomiting, or diarrhea. 04/20/2021 upon evaluation today patient appears to be doing decently well in regard to  her foot. There is some need for sharp debridement here today. With that being said we will get a go ahead and proceed with that today as the foot is becoming somewhat macerated with  the overhanging callus that is definitely not we want to see. With that being said the patient does have an issue here as well with a boil in the perineal region unfortunately that is an issue for her today as well. I did have a look at that as well. We are still working on dealing with insurance as far as getting hyperbarics approved. 04/27/2021 upon evaluation today patient appears to be doing somewhat poorly in general compared to where she has been. At this point she is having a lot of swelling which is the main concerning thing that I am seeing. There does not appear to be any signs of infection currently which is good news but at the same time I do feel like that she is a lot more swollen than she was even last week and is showing how much she is weeping in regard to the right lower leg. She also has a blister on the left breast although is not an open wound at this time. She continues to have the area in the perineum as well. Objective Constitutional Obese and well-hydrated in no acute distress. Vitals Time Taken: 7:43 AM, Height: 62 in, Weight: 323 lbs, BMI: 59.1, Temperature: 98.1 F, Pulse: 88 bpm, Respiratory Rate: 17 breaths/min, Blood Pressure: 109/66 mmHg, Capillary Blood Glucose: 61 mg/dl. Respiratory normal breathing without difficulty. Psychiatric this patient is able to make decisions and demonstrates good insight into disease process. Alert and Oriented x 3. pleasant and cooperative. General Notes: Upon inspection patient appears to be doing decently well in regard to her wounds although she has a lot of weeping and drainage in and around the foot area there is no signs of infection nothing is erythematous or warm to touch. She is been doing well with the doxycycline currently. Integumentary (Hair, Skin) Wound #10 status is Open. Original cause of wound was Other Lesion. The date acquired was: 01/18/2021. The wound has been in treatment 1 weeks. The wound is located on the Left  Perineum. The wound measures 1cm length x 1cm width x 0.1cm depth; 0.785cm^2 area and 0.079cm^3 volume. There is Fat Layer (Subcutaneous Tissue) exposed. There is no tunneling or undermining noted. There is a medium amount of serosanguineous drainage noted. The wound margin is distinct with the outline attached to the wound base. There is large (67-100%) red, pink, hyper - granulation within the wound bed. There is no necrotic tissue within the wound bed. Wound #11 status is Open. Original cause of wound was Gradually Appeared. The date acquired was: 04/27/2021. The wound is located on the Right,Dorsal Foot. The wound measures 1.5cm length x 2.8cm width x 0.1cm depth; 3.299cm^2 area and 0.33cm^3 volume. There is no tunneling or undermining noted. There is a medium amount of serosanguineous drainage noted. The wound margin is distinct with the outline attached to the wound base. There is medium (34-66%) red, pink granulation within the wound bed. There is a medium (34-66%) amount of necrotic tissue within the wound bed including Adherent Slough. Wound #8 status is Open. Original cause of wound was Gradually Appeared. The date acquired was: 11/09/2020. The wound has been in treatment 3 weeks. The wound is located on the [place]. The wound measures 1.8cm length x 1.3cm width x 0.2cm depth; 1.838cm^2 area and 0.368cm^3 volume. There is  Fat Layer (Subcutaneous Tissue) exposed. There is no tunneling or undermining noted. There is a medium amount of serosanguineous drainage noted. The wound margin is well defined and not attached to the wound base. There is large (67-100%) pink granulation within the wound bed. There is no necrotic tissue within the wound bed. Wound #9 status is Open. Original cause of wound was Surgical Injury. The date acquired was: 01/30/2020. The wound has been in treatment 3 weeks. The wound is located on the Right Amputation Site - T The wound measures 0.1cm length x 0.1cm width  x 0.1cm depth; 0.008cm^2 area and 0.001cm^3 volume. oe. There is Fat Layer (Subcutaneous Tissue) exposed. There is no tunneling or undermining noted. There is a medium amount of serosanguineous drainage noted. The wound margin is distinct with the outline attached to the wound base. There is large (67-100%) pink granulation within the wound bed. There is no necrotic tissue within the wound bed. Assessment Active Problems ICD-10 Type 2 diabetes mellitus with foot ulcer Non-pressure chronic ulcer of other part of right foot with fat layer exposed Disruption of external operation (surgical) wound, not elsewhere classified, initial encounter Acquired absence of other right toe(s) Cellulitis of perineum Non-pressure chronic ulcer of skin of other sites with fat layer exposed Essential (primary) hypertension Procedures Wound #11 Pre-procedure diagnosis of Wound #11 is a Diabetic Wound/Ulcer of the Lower Extremity located on the Right,Dorsal Foot . There was a Double Layer Compression Therapy Procedure by Deon Pilling, RN. Post procedure Diagnosis Wound #11: Same as Pre-Procedure Wound #8 Pre-procedure diagnosis of Wound #8 is a Diabetic Wound/Ulcer of the Lower Extremity located on the Right,Plantar Foot . There was a Double Layer Compression Therapy Procedure by Deon Pilling, RN. Post procedure Diagnosis Wound #8: Same as Pre-Procedure Wound #9 Pre-procedure diagnosis of Wound #9 is an Open Surgical Wound located on the Right Amputation Site - T . There was a Double Layer Compression Therapy oe Procedure by Deon Pilling, RN. Post procedure Diagnosis Wound #9: Same as Pre-Procedure Plan Follow-up Appointments: Return Appointment in 1 week. - with Margarita Grizzle Call primary care provider today to be seen for evaluation related to fluid overload, regularly need to be taking diuretics (lasix), and leg cramping with diuretics. May need blood work. Bathing/ Shower/ Hygiene: May shower with  protection but do not get wound dressing(s) wet. Edema Control - Lymphedema / SCD / Other: Elevate legs to the level of the heart or above for 30 minutes daily and/or when sitting, a frequency of: Avoid standing for long periods of time. Patient to wear own compression stockings every day. - left leg daily Exercise regularly Additional Orders / Instructions: Other: - Ensure to take your diuretic regularly related to fluid overload. 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 Follow Hyperbaric Oxygen Glycemia Protocol Afrin (Oxymetazoline HCL) 0.05% nasal spray - 1 spray in both nostrils daily as needed prior to HBO treatment for difficulty clearing ears The following medication(s) was prescribed: doxycycline hyclate oral 100 mg capsule 1 1 capsule oral taken 2 times per day for 14 days starting 04/27/2021 WOUND #10: - Perineum Wound Laterality: Left Prim Dressing: KerraCel Ag Gelling Fiber Dressing, 2x2 in (silver alginate) 1 x  Per Day/30 Days ary Discharge Instructions: Apply silver alginate to wound bed as instructed Secondary Dressing: Woven Gauze Sponges 2x2 in 1 x Per Day/30 Days Discharge Instructions: Apply over primary dressing as directed. Secured With: tegaderm tranparent film dressing 2x3 in 1 x Per Day/30 Days WOUND #11: - Foot Wound Laterality: Dorsal, Right Cleanser: Soap and Water 1 x Per Week/30 Days Discharge Instructions: May shower and wash wound with dial antibacterial soap and water prior to dressing change. Cleanser: Wound Cleanser 1 x Per Week/30 Days Discharge Instructions: Cleanse the wound with wound cleanser prior to  applying a clean dressing using gauze sponges, not tissue or cotton balls. Peri-Wound Care: Zinc Oxide Ointment 30g tube 1 x Per Week/30 Days Discharge Instructions: Apply Zinc Oxide to periwound with each dressing change Prim Dressing: KerraCel Ag Gelling Fiber Dressing, 2x2 in (silver alginate) 1 x Per Week/30 Days ary Discharge Instructions: Apply silver alginate to wound bed as instructed Secondary Dressing: Woven Gauze Sponge, Non-Sterile 4x4 in 1 x Per Week/30 Days Discharge Instructions: Apply over primary dressing as directed. Com pression Wrap: CoFlex TLC XL 2-layer Compression System 4x7 (in/yd) 1 x Per Week/30 Days Discharge Instructions: Apply CoFlex 2-layer compression as directed. (alt for 4 layer) WOUND #8: - Foot Wound Laterality: Plantar, Right Peri-Wound Care: Sween Lotion (Moisturizing lotion) 1 x Per Week/30 Days Discharge Instructions: Apply moisturizing lotion as directed Prim Dressing: KerraCel Ag Gelling Fiber Dressing, 2x2 in (silver alginate) 1 x Per Week/30 Days ary Discharge Instructions: Apply silver alginate. Secondary Dressing: Woven Gauze Sponge, Non-Sterile 4x4 in 1 x Per Week/30 Days Discharge Instructions: Apply over primary dressing as directed. Secondary Dressing: ABD Pad, 5x9 1 x Per Week/30 Days Discharge Instructions: Apply over primary dressing as directed. Secondary Dressing: Optifoam Non-Adhesive Dressing, 4x4 in 1 x Per Week/30 Days Discharge Instructions: Apply over primary dressing cut to form donut Com pression Wrap: CoFlex TLC XL 2-layer Compression System 4x7 (in/yd) 1 x Per Week/30 Days Discharge Instructions: Apply CoFlex 2-layer compression as directed. (alt for 4 layer) WOUND #9: - Amputation Site - T oe Wound Laterality: Right Peri-Wound Care: Sween Lotion (Moisturizing lotion) 1 x Per Week/30 Days Discharge Instructions: Apply moisturizing lotion as directed Prim Dressing: KerraCel Ag Gelling Fiber Dressing, 2x2 in (silver alginate) 1  x Per Week/30 Days ary Discharge Instructions: Apply silver alginate to wound bed as instructed Secondary Dressing: Woven Gauze Sponge, Non-Sterile 4x4 in 1 x Per Week/30 Days Discharge Instructions: Apply over primary dressing as directed. Secondary Dressing: ABD Pad, 5x9 1 x Per Week/30 Days Discharge Instructions: Apply over primary dressing as directed. Com pression Wrap: CoFlex TLC XL 2-layer Compression System 4x7 (in/yd) 1 x Per Week/30 Days Discharge Instructions: Apply CoFlex 2-layer compression as directed. (alt for 4 layer) 1. Would recommend currently that we actually continue with the doxycycline for 2 additional weeks and I am to send this into the pharmacy for her. Patient is in agreement with that plan. She is done well with this in the past and the big thing on waiting on is try to get her into the hyperbaric oxygen therapy chamber in order to try to continue along with treatment in regard to the osteomyelitis. Nonetheless at this time I feel like that she is still continuing to have a lot of weeping and drainage today which is much more significant than even what I saw last week. I think that she needs to see her primary care provider to check on some blood work including her electrolyte balance  and she tells me she has been having a lot of cramping over next couple of days as well she has not been taking Lasix although she is supposed to she appears to just take this when she feels like she needs it. 2. I am on recommend as well that for now I think the patient needs to get an appointment with her primary care provider ASAP I recommend that she call them today for a checkup. 3. I am also can recommend that we continue and switch to silver alginate for all wounds currently. We are still using the Coflex 2 layer compression wrap but we will get a try to anchor at the top to keep it from sliding. I think the main reason it slid is just because she is swelling so tremendously right  now. We will see patient back for reevaluation in 1 week here in the clinic. If anything worsens or changes patient will contact our office for additional recommendations. Electronic Signature(s) Signed: 04/27/2021 8:26:17 AM By: Worthy Keeler PA-C Entered By: Worthy Keeler on 04/27/2021 08:26:16 -------------------------------------------------------------------------------- SuperBill Details Patient Name: Date of Service: [name], [name] 04/27/2021 Medical Record Number: [id number] Patient Account Number: [id number] Date of Birth/Sex: Treating RN: 07-22-1961 (60 y.o. F) Primary Care Provider: [name] Other Clinician: Referring Provider: Treating Provider/Extender: Debbra Riding in Treatment: 3 Diagnosis Coding ICD-10 Codes Code Description [id number] 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 T81.31XA Disruption of external operation (surgical) wound, not elsewhere classified, initial encounter 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) L03.315 Cellulitis of perineum L98.492 Non-pressure chronic ulcer of skin of other sites with fat layer exposed I10 Essential (primary) hypertension Physician Procedures : CPT4 Code Description Modifier [id number] - WC PHYS LEVEL 4 - EST PT ICD-10 Diagnosis Description E11.621 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 T81.31XA  Disruption of external operation (surgical) wound, not elsewhere classified, initial encounter [phone] Acquired absence of other right toe(s) Quantity: 1 Electronic Signature(s) Signed: 04/27/2021 8:26:30 AM By: Worthy Keeler PA-C Entered By: Worthy Keeler on 04/27/2021 59:94:37

## 2021-04-29 ENCOUNTER — Other Ambulatory Visit: Payer: Self-pay | Admitting: Orthopedic Surgery

## 2021-04-30 ENCOUNTER — Other Ambulatory Visit: Payer: Self-pay | Admitting: Family Medicine

## 2021-04-30 DIAGNOSIS — J45909 Unspecified asthma, uncomplicated: Secondary | ICD-10-CM

## 2021-05-02 ENCOUNTER — Ambulatory Visit (INDEPENDENT_AMBULATORY_CARE_PROVIDER_SITE_OTHER): Payer: 59 | Admitting: Family Medicine

## 2021-05-02 ENCOUNTER — Encounter: Payer: Self-pay | Admitting: Family Medicine

## 2021-05-02 ENCOUNTER — Other Ambulatory Visit: Payer: Self-pay

## 2021-05-02 VITALS — BP 124/62 | HR 90 | Temp 98.2°F | Ht 62.0 in | Wt 330.0 lb

## 2021-05-02 DIAGNOSIS — R600 Localized edema: Secondary | ICD-10-CM | POA: Diagnosis not present

## 2021-05-02 DIAGNOSIS — R252 Cramp and spasm: Secondary | ICD-10-CM | POA: Diagnosis not present

## 2021-05-02 LAB — BASIC METABOLIC PANEL
BUN: 20 mg/dL (ref 6–23)
CO2: 27 mEq/L (ref 19–32)
Calcium: 9.4 mg/dL (ref 8.4–10.5)
Chloride: 102 mEq/L (ref 96–112)
Creatinine, Ser: 0.98 mg/dL (ref 0.40–1.20)
GFR: 63.03 mL/min (ref >60.00)
Glucose, Bld: 101 mg/dL — ABNORMAL HIGH (ref 70–99)
Potassium: 4.4 mEq/L (ref 3.5–5.1)
Sodium: 139 mEq/L (ref 135–145)

## 2021-05-02 LAB — MAGNESIUM: Magnesium: 1.7 mg/dL (ref 1.5–2.5)

## 2021-05-02 MED ORDER — POTASSIUM BICARBONATE 25 MEQ PO TBEF: EFFERVESCENT_TABLET | ORAL | 5 refills | Status: DC

## 2021-05-02 MED ORDER — FLUCONAZOLE 150 MG PO TABS: 150.0000 mg | ORAL_TABLET | ORAL | 2 refills | Status: DC

## 2021-05-02 NOTE — Patient Instructions (Addendum)
Give [place] 2-3 business days to get the results of your labs back.   Keep the diet clean and stay active.  Try some magnesium.  Swelling isn't shabby today!  I don't hear any wheezing.   I recommend getting the flu shot in mid October. This suggestion would change if the CDC comes out with a different recommendation.   Let us know if you need anything.

## 2021-05-02 NOTE — Progress Notes (Signed)
Chief Complaint  Patient presents with   Follow-up    Edema Labs today Check wounds     Subjective: Patient is a 60 y.o. female here for swelling. Here w spouse.   Patient has chronic lower extremity edema in both of her legs.  She wears compression stockings relatively routinely.  Currently they are working pretty well.  She has been dealing with a poorly healing wound on the right lower extremity and is following with the wound care team.  They are interested in getting her sometime in the hyperbaric oxygen chamber but did not want her to have leg cramping with it.  She has been having this more so lately.  She takes Lasix for her lower extremity swelling but the Klor-Con she has been taking with it is causing her to have indigestion.  She is looking to see what her labs are regarding the cramping in addition to an alternative to the oral potassium tablet.  Past Medical History:  Diagnosis Date   Aortic stenosis    mild AS by echo 02/2021   Asthma    "on daily RX and rescue inhaler" (04/18/2018)   Chronic diastolic CHF (congestive heart failure) (HCC) 09/2015   Chronic lower back pain    Chronic neck pain    Chronic pain syndrome    Fentanyl Patch   Colon polyps    Coronary artery disease    cath with normal LM, 30% LAD, 85% mid RCA and 95% distal RCA s/p PCI of the mid to distal RCA and now on DAPT with ASA and Ticagrelor.     Eczema    Excessive daytime sleepiness 11/26/2015   Fibromyalgia    Gallstones    GERD (gastroesophageal reflux disease)    Heart murmur    "noted for the 1st time on 04/18/2018"   History of blood transfusion 07/2010   "S/P oophorectomy"   History of gout    History of hiatal hernia 1980s   "gone now" (04/18/2018)   Hyperlipidemia    Hypertension    takes Metoprolol and Enalapril daily   Hypothyroidism    takes Synthroid daily   IBS (irritable bowel syndrome)    Migraine    "nothing in the 2000s" (04/18/2018)   Mixed connective tissue disease (HCC)     NAFLD (nonalcoholic fatty liver disease)    Pneumonia    "several times" (04/18/2018)   PVC's (premature ventricular contractions)    noted on event monitor 02/2021   Rheumatoid arthritis (HCC)    "hands, elbows, shoulders, probably knees" (04/18/2018)   Scoliosis    Spondylosis    Type II diabetes mellitus (HCC)    takes Metformin and Hum R daily (04/18/2018)    Objective: BP 124/62   Pulse 90   Temp 98.2 F (36.8 C) (Oral)   Ht 5\' 2"  (1.575 m)   Wt (!) 330 lb (149.7 kg)   SpO2 94%   BMI 60.36 kg/m  General: Awake, appears stated age HEENT: MMM, EOMi Heart: RRR, 1+ pitting lower extremity edema bilaterally tapering at the knee MSK: No calf tenderness to palpation bilaterally Lungs: CTAB, no rales, wheezes or rhonchi. No accessory muscle use Psych: Age appropriate judgment and insight, normal affect and mood  Assessment and Plan: Bilateral lower extremity edema  Leg cramping - Plan: Basic metabolic panel, Magnesium  Continue Lasix 10 mg daily as needed for swelling.  If she does not urinate within 20 minutes of taking the medicine, she will let me know and we  will likely increase to 20 mg per dose.  I sent in a dissolvable potassium to take instead of the Klor-Con. Check labs today.  Hopefully the supplemental potassium will help.  Stay hydrated.  She will continue mustard/pickle juice. The patient and her spouse voiced understanding and agreement to the plan.  Jilda Roche Milroy, DO 05/02/21  12:14 PM

## 2021-05-04 ENCOUNTER — Other Ambulatory Visit: Payer: Self-pay

## 2021-05-04 ENCOUNTER — Other Ambulatory Visit: Payer: 59

## 2021-05-04 ENCOUNTER — Encounter (HOSPITAL_BASED_OUTPATIENT_CLINIC_OR_DEPARTMENT_OTHER): Payer: 59 | Admitting: Physician Assistant

## 2021-05-04 DIAGNOSIS — E11621 Type 2 diabetes mellitus with foot ulcer: Secondary | ICD-10-CM | POA: Diagnosis not present

## 2021-05-04 NOTE — Progress Notes (Addendum)
[name] [name] ([id number]) Visit Report for 05/04/2021 Chief Complaint Document Details Patient Name: Date of Service: [name] [name] [name] 05/04/2021 8:45 A M Medical Record Number: [id number] Patient Account Number: [id number] Date of Birth/Sex: Treating RN: [birth year] (60 y.o. [name] Primary Care Provider: [name] Other Clinician: Referring Provider: Treating Provider/Extender: Debbra Riding in Treatment: 4 Information Obtained from: Patient Chief Complaint Right foot ulcer and left perineal region Electronic Signature(s) Signed: 05/04/2021 9:17:00 AM By: Worthy Keeler PA-C Entered By: Worthy Keeler on 05/04/2021 09:16:59 -------------------------------------------------------------------------------- Debridement Details Patient Name: Date of Service: [name] [name] [name] 05/04/2021 8:45 A M Medical Record Number: [id number] Patient Account Number: [id number] Date of Birth/Sex: Treating RN: September 20, 1960 (60 y.o. [name] Primary Care Provider: [name] Other Clinician: Referring Provider: Treating Provider/Extender: Debbra Riding in Treatment: 4 Debridement Performed for Assessment: Wound #12 Left,Anterior Breast Performed By: Physician Worthy Keeler, PA Debridement Type: Chemical/Enzymatic/Mechanical Agent Used: Santyl Level of Consciousness (Pre-procedure): Awake and Alert Pre-procedure Verification/Time Out Yes - 09:29 Taken: Start Time: 09:30 Bleeding: None End Time: 09:33 Response to Treatment: Procedure was tolerated well Level of Consciousness (Post- Awake and Alert procedure): Post Debridement Measurements of Total Wound Length: (cm) 2 Width: (cm) 1.7 Depth: (cm) 0.1 Volume: (cm) 0.267 Character of Wound/Ulcer Post Debridement: Stable Post Procedure Diagnosis Same as Pre-procedure Electronic Signature(s) Signed: 05/04/2021 5:22:48 PM By: Lorrin Jackson Signed:  05/04/2021 6:37:42 PM By: Worthy Keeler PA-C Entered By: Lorrin Jackson on 05/04/2021 09:33:13 -------------------------------------------------------------------------------- HPI Details Patient Name: Date of Service: [name] [name] [name] 05/04/2021 8:45 A M Medical Record Number: [id number] Patient Account Number: [id number] Date of Birth/Sex: Treating RN: 04/29/1961 (60 y.o. [name] Primary Care Provider: [name] Other Clinician: Referring Provider: Treating Provider/Extender: Debbra Riding in Treatment: 4 History of Present Illness HPI Description: 07/23/2019 on evaluation today patient presents with a myriad of wounds noted at multiple locations over her right heel, right lower extremity, and abdominal region. She also has bilateral lower extremity lymphedema which is quite significant as well. Incidentally she also has congestive heart failure and hypertension. She also is obese. With that being said I think all this is contributing as well to her lower extremity edema which is very much uncontrolled. It seems like its been at least several months since she is worn any compression according to what she tells me. With that being said I am not sure exactly when that would have been. She does have fibrotic changes in the lower extremity secondary to lymphedema worse on the left than the right. She did show me pictures of wound she had on the anterior portion of her shin which was quite significant fortunately that has healed. These issues have been intermittent at this time. Again I think that with appropriate compression therapy she may actually be doing better than what we are seeing at this point but again I am not really sure that she is ever been extremely compliant with that. Fortunately there is no signs of active infection at this time. No fever chills noted. As far as the abdominal ulcer she initially had one wound that she thinks may  have been a bug bite. Subsequently she was put a dressing on this and she states that the tape pulled skin off on other locations causing other wounds that have not healed that is been about 3 months. The wounds on her lower extremities have been  intermittent over 3 years. This includes the heel. 11/19; this is a patient that I have not seen previously. She was admitted to our clinic last week with multiple wounds including several superficial circular areas on her abdomen predominantly right upper quadrant. Also 1 in her umbilicus. She has an area on her right lateral malleolus which was new today. She has bilateral lower extremity edema with very significant stasis dermatitis on the left anterior tibial area. She has tightly adherent skin in her lower extremities probably secondary to cutaneous fibrosis in the area. We put her in compression last week. She comes in with the dressings reasonably saturated. 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 She has multiple areas on her arms from scratching. 12/3; the patient I saw for the first time 2 weeks ago. She has 3 small circular areas on her abdomen 2 in the right upper quadrant one on her umbilicus. We have been using silver alginate to this area. She also had an area on her right lateral malleolus and right heel and right medial malleolus. We have been using silver alginate here under compression. She does not have an open area on the left leg today. We are going to order her stockings for the left leg. She clearly has chronic lymphedema in these areas. X-ray of the foot from 10/20 did not show any fracture or dislocation. The heel wound was noted there was no evidence of osteomyelitis She complains of generalized pruritus. She has mixed connective tissue  disease for which she is on hydroxychloroquine. She has longstanding lymphedema. Although she does not have known liver disease I looked at his CT scan of the abdomen from February of this year that showed hepatic steatosis 12/11; patient still has no open area on the left leg we transitioned her into a stocking she had. Her stockings from [employer] are supposed to be arriving later today which will be the stockings of choice. The only wound remaining on the right is the right heel 2 small superficial open areas. Everything else is well on its way to healing in the right leg few small excoriations. She has 1 major area remaining on her abdomen the rest seem to be healing 12/22 patient still has no open area on the left leg and she is still in a stocking. She had still 2 open areas on the tip of her right heel. These look like pressure related areas although the patient is not really certain how this is happening. She has an open heeled shoe that we provided. Still has 1 open area on the right lateral abdomen The patient has complaints of generalized pruritus. She has multiple excoriated areas on her abdomen that of close over her arms or thighs which I think are from scratching. She tells me that she has never had a basic work-up for this which might include basic lab work, liver functions, kidney functions, thyroid etc. She might also benefit from a dermatologist 12/29; the patient has a deeper larger more painful wound on the tip of her right heel. Again these look like pressure ulcers although she wears an open toed heel pads or heel at night to prevent it from hitting the mattress etc. They tell me and remind me that this is been present on and off for about 2 years that has closed over but then  will reopen. I did look over her lab work that was available in [place]. She does not have elevated liver function tests or creatinine. I was not able to see a TSH. This was in response to her  complaints of generalized itching and a itch/scratch cycle. I also noted that she is on OxyContin and oxycodone and of course narcotics can cause generalized pruritus as a side effect Readmission: History From [place], [place] Last Week: 03/29/2021 this is a patient who presents for initial evaluation here in the clinic though I have seen her 1 time previously in 2020 this was in [place]. That was actually her initial visit therefore a heel ulceration. Subsequently that did not healing she actually saw me the 1 time in November and then subsequently saw Dr. Dellia Nims through the end of December where she apparently was healed. 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 This is on the right. With that being said right now she has a basically region on the lateral portion of the ray site where she is applying more pressure especially as her foot seems to be starting to turn in and this has become an increasingly significant issue for her to be honest. She does see Dr. Sharol Given and Dr. Sharol Given has had her on doxycycline for quite a bit of time here. Subsequently she is just now wrapping that out. I think that she probably would be benefited by continue the doxycycline for a time while we can work through what we need to do with things here. She does have evidence of osteomyelitis based on what I saw on the MRI today. This obviously is unfortunate and definitely not something that she was hoping to hear. With that being said I do believe that she would potentially be a strong candidate for hyperbaric oxygen therapy. Also believe she could be a candidate for total contact cast though we have to be cautious due to the fact that how her foot is bending inward I think that this is good to be a little bit of a concern. We will definitely have to keeping a close eye on things. 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 She also other than the amputation has a medical history positive for hypertension. She has never undergone hyperbaric oxygen therapy previously I did show her the chamber we did talk a little bit about it today as well. Admission [facility]: o 04/06/2021 Patient presents here in [place] today for evaluation. Again she is establishing care here after I saw her last week in [place]. Obviously I think that she is doing extremely well at this point which is great news and in general I am extremely pleased with where things stand from the standpoint of the appearance of the wound. Obviously this is not significantly smaller but does look a lot cleaner I think using the Hydrofera Blue has been of benefit over the past week. Nonetheless she still has a wound with issues with pressure here which I think is good to be an ongoing issue. Also think that the osteomyelitis which is chronic is also getting ongoing issue. She does want to try to do what she can to prevent this from worsening and ending with a below-knee amputation. T that end I do think that getting her into the hyperbaric oxygen chamber would be what we need to do.  She has recently been seen by cardiology o and subsequently well as far as that is concerned. Her ejection fraction was appropriate for hyperbarics and everything seems to be doing great in that regard. She has not had a recent chest x-ray she is a former smoker around 15 years ago she quit. Nonetheless I do believe with her asthma we do want to do a chest x-ray just to make sure everything is okay before proceeding with the hyperbarics although we can go ahead and see about getting the approval. I also think she is going require some sharp debridement and around this wound we also have to contact her insurance for prior approval on this as well. 04/13/2021 upon evaluation today patient appears to be doing a little bit better in regard to her leg in fact  the swelling is dramatically better. Very pleased with where things stand in that regard. Fortunately there does not appear to be any signs of active infection at this time. No fevers, chills, nausea, vomiting, or diarrhea. 04/20/2021 upon evaluation today patient appears to be doing decently well in regard to her foot. There is some need for sharp debridement here today. With that being said we will get a go ahead and proceed with that today as the foot is becoming somewhat macerated with the overhanging callus that is definitely not we want to see. With that being said the patient does have an issue here as well with a boil in the perineal region unfortunately that is an issue for her today as well. I did have a look at that as well. We are still working on dealing with insurance as far as getting hyperbarics approved. 04/27/2021 upon evaluation today patient appears to be doing somewhat poorly in general compared to where she has been. At this point she is having a lot of swelling which is the main concerning thing that I am seeing. There does not appear to be any signs of infection currently which is good news but at the same time I do feel like that she is a lot more swollen than she was even last week and is showing how much she is weeping in regard to the right lower leg. She also has a blister on the left breast although is not an open wound at this time. She continues to have the area in the perineum as well. 05/04/2021 upon evaluation today patient appears to actually be doing decently well in regard to her wound on the foot. Fortunately there is no signs of active infection at this time. No fevers, chills, nausea, vomiting, or diarrhea. Unfortunately she does have an area on the left breast which is actually appearing to be a burn based on what I see physically. She does note that she uses rice bags for areas in general and may have left one in that area not realizing it was burning her as she  does not really have much feeling. I think this is very probable based on what I am seeing. For that reason working to treat this as such I do think we need to loosen up a lot of the necrotic tissue here. I Am going tosend in a prescription for Santyl for the patient. Electronic Signature(s) Signed: 05/04/2021 11:43:20 AM By: Worthy Keeler PA-C Entered By: Worthy Keeler on 05/04/2021 11:43:20 -------------------------------------------------------------------------------- Physical Exam Details Patient Name: Date of Service: [name] [name] [name] 05/04/2021 8:45 A M Medical Record Number: [id number] Patient Account Number: [id number] Date of Birth/Sex:  Treating RN: November 19, 1960 (60 y.o. [name] Primary Care Provider: [name] Other Clinician: Referring Provider: Treating Provider/Extender: Debbra Riding in Treatment: 4 Constitutional Obese and well-hydrated in no acute distress. Respiratory normal breathing without difficulty. Psychiatric this patient is able to make decisions and demonstrates good insight into disease process. Alert and Oriented x 3. pleasant and cooperative. Notes Upon inspection patient's wound bed actually showed signs of good granulation in regard to the foot wound there was some dry skin around the edge. Fortunately this seems overall though to be doing much better. With regard to the wound in the perineal region this is about the same although she tells me when the dressing we put on came off she did not put anything else on. I do think that it would be best to have this on more regularly. With regard to the left breast this does appear to be a burn location I think Santyl is necessary to try to loosen this up and we discussed that today in the office as well. I Minna send in a prescription. Electronic Signature(s) Signed: 05/04/2021 11:44:03 AM By: Worthy Keeler PA-C Entered By: Worthy Keeler on 05/04/2021  11:44:03 -------------------------------------------------------------------------------- Physician Orders Details Patient Name: Date of Service: [name] [name] [name] 05/04/2021 8:45 A M Medical Record Number: [id number] Patient Account Number: [id number] Date of Birth/Sex: Treating RN: 1960-10-29 (60 y.o. 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 - with Margarita Grizzle (has been approved by insurance) Return A Bathing/ Shower/ Hygiene May shower with protection but do not get wound dressing(s) wet. Edema Control - Lymphedema / SCD / Other Bilateral Lower Extremities Elevate legs to the level of the heart or above for 30 minutes daily and/or when sitting, a frequency of: Avoid standing for long periods of time. Patient to wear own compression stockings every day. - left leg daily Exercise regularly Additional Orders / Instructions Other: - Ensure to take your diuretic regularly related to fluid overload. 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 Follow Hyperbaric Oxygen Glycemia Protocol A frin (Oxymetazoline HCL) 0.05% nasal spray - 1 spray in both nostrils daily as needed prior to HBO treatment for difficulty clearing ears Wound Treatment Wound #10 - Perineum Wound Laterality: Left Cleanser: Soap and Water 1 x Per Day/30 Days Discharge Instructions: May shower and wash wound with dial antibacterial soap and water prior to dressing change. Cleanser: Wound Cleanser 1 x Per Day/30 Days Discharge Instructions: Cleanse the wound with wound cleanser prior to applying a clean dressing using gauze sponges, not tissue or cotton balls. Prim Dressing: KerraCel Ag Gelling Fiber Dressing, 2x2 in (silver  alginate) 1 x Per Day/30 Days ary Discharge Instructions: Apply silver alginate to wound bed as instructed Secondary Dressing: Woven Gauze Sponges 2x2 in 1 x Per Day/30 Days Discharge Instructions: Apply over primary dressing as directed. Secured With: tegaderm tranparent film dressing 2x3 in 1 x Per Day/30 Days Wound #11 - Foot Wound Laterality: Dorsal, Right Cleanser: Soap and Water 1 x Per Week/30 Days Discharge Instructions: May shower and wash wound with dial antibacterial soap and water prior to dressing change. Cleanser: Wound Cleanser 1 x Per Week/30 Days Discharge Instructions: Cleanse the wound with wound cleanser prior to applying a clean dressing using gauze sponges, not tissue or cotton balls. Peri-Wound Care: Zinc Oxide Ointment 30g tube 1 x Per Week/30 Days Discharge Instructions: Apply Zinc Oxide to periwound with each dressing change Prim Dressing: KerraCel Ag Gelling Fiber Dressing, 2x2 in (silver alginate) 1 x Per Week/30 Days ary Discharge Instructions: Apply silver alginate to wound bed as instructed Secondary Dressing: Woven Gauze Sponge, Non-Sterile 4x4 in 1 x Per Week/30 Days Discharge Instructions: Apply over primary dressing as  directed. Compression Wrap: Unnaboot w/Calamine, 4x10 (in/yd) 1 x Per Week/30 Days Discharge Instructions: Apply layer at top to anchor wrap Compression Wrap: CoFlex TLC XL 2-layer Compression System 4x7 (in/yd) 1 x Per Week/30 Days Discharge Instructions: Apply CoFlex 2-layer compression as directed. (alt for 4 layer) Wound #12 - Breast Wound Laterality: Left, Anterior Cleanser: Soap and Water 1 x Per Day/30 Days Discharge Instructions: May shower and wash wound with dial antibacterial soap and water prior to dressing change. Cleanser: Wound Cleanser 1 x Per Day/30 Days Discharge Instructions: Cleanse the wound with wound cleanser prior to applying a clean dressing using gauze sponges, not tissue or cotton balls. Prim Dressing: Santyl Ointment 1 x Per Day/30 Days ary Discharge Instructions: Apply nickel thick amount to wound bed as instructed Secondary Dressing: Woven Gauze Sponges 2x2 in 1 x Per Day/30 Days Discharge Instructions: Cover Santyl with saline moistened 2x2 Secondary Dressing: Zetuvit Plus Silicone Border Dressing 4x4 (in/in) 1 x Per Day/30 Days Discharge Instructions: Apply silicone border over primary dressing as directed. Wound #8 - Foot Wound Laterality: Plantar, Right Peri-Wound Care: Sween Lotion (Moisturizing lotion) 1 x Per Week/30 Days Discharge Instructions: Apply moisturizing lotion as directed Prim Dressing: KerraCel Ag Gelling Fiber Dressing, 2x2 in (silver alginate) ary 1 x Per Week/30 Days Discharge Instructions: Apply silver alginate. Secondary Dressing: Woven Gauze Sponge, Non-Sterile 4x4 in 1 x Per Week/30 Days Discharge Instructions: Apply over primary dressing as directed. Secondary Dressing: ABD Pad, 5x9 1 x Per Week/30 Days Discharge Instructions: Apply over primary dressing as directed. Secondary Dressing: Optifoam Non-Adhesive Dressing, 4x4 in 1 x Per Week/30 Days Discharge Instructions: Apply over primary dressing cut to form donut Compression Wrap:  Unnaboot w/Calamine, 4x10 (in/yd) 1 x Per Week/30 Days Discharge Instructions: Apply layer at top to anchor wrap Compression Wrap: CoFlex TLC XL 2-layer Compression System 4x7 (in/yd) 1 x Per Week/30 Days Discharge Instructions: Apply CoFlex 2-layer compression as directed. (alt for 4 layer) Wound #9 - Amputation Site - Toe Wound Laterality: Right Cleanser: Soap and Water 1 x Per Week/30 Days Discharge Instructions: May shower and wash wound with dial antibacterial soap and water prior to dressing change. Cleanser: Wound Cleanser 1 x Per Week/30 Days Discharge Instructions: Cleanse the wound with wound cleanser prior to applying a clean dressing using gauze sponges, not tissue or cotton balls. Peri-Wound Care: Sween Lotion (Moisturizing lotion) 1 x Per Week/30 Days Discharge Instructions: Apply moisturizing  lotion as directed Prim Dressing: KerraCel Ag Gelling Fiber Dressing, 2x2 in (silver alginate) 1 x Per Week/30 Days ary Discharge Instructions: Apply silver alginate to wound bed as instructed Secondary Dressing: Woven Gauze Sponge, Non-Sterile 4x4 in 1 x Per Week/30 Days Discharge Instructions: Apply over primary dressing as directed. Secondary Dressing: ABD Pad, 5x9 1 x Per Week/30 Days Discharge Instructions: Apply over primary dressing as directed. Compression Wrap: Unnaboot w/Calamine, 4x10 (in/yd) 1 x Per Week/30 Days Discharge Instructions: Apply layer at top to anchor wrap Compression Wrap: CoFlex TLC XL 2-layer Compression System 4x7 (in/yd) 1 x Per Week/30 Days Discharge Instructions: Apply CoFlex 2-layer compression as directed. (alt for 4 layer) Patient Medications llergies: peanut, fish oil, Fish Containing Products, Crestor, lovastatin, metronidazole, Red Yeast Rice ([place] Purpureus), rotigotine, Atrovent, Suprep A Bowel Prep Kit, adhesive tape, Betadine, Dial soap Notifications Medication Indication Start End 05/04/2021 Santyl DOSE topical 250 unit/gram ointment -  ointment topical Apply nickel thick daily to the wound bed and then cover with a dressing as directed in clinic x 30 days GLYCEMIA INTERVENTIONS PROTOCOL PRE-HBO [place] Obtain pre-HBO capillary blood glucose (ensure 1 physician order is in chart). A. Notify HBO physician and await physician orders. 2 If result is 70 mg/dl or below: B. If the result meets the hospital definition of a critical result, follow hospital policy. A. Give patient an 8 ounce Glucerna Shake, an 8 ounce Ensure, or 8 ounces of a Glucerna/Ensure equivalent dietary supplement*. B. Wait 30 minutes. If result is 71 mg/dl to 130 mg/dl: C. Retest patients capillary blood glucose (CBG). D. If result greater than or equal to 110 mg/dl, proceed with HBO. If result less than 110 mg/dl, notify HBO physician and consider holding HBO. If result is 131 mg/dl to 249 mg/dl: A. Proceed with HBO. A. Notify HBO physician and await physician orders. B. It is recommended to hold HBO and do If result is 250 mg/dl or greater: blood/urine ketone testing. C. If the result meets the hospital definition of a critical result,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

## 2021-05-05 NOTE — Progress Notes (Signed)
[name] ([id number]) Visit Report for 05/04/2021 Arrival Information Details Patient Name: Date of Service: [name], DA [name]. 05/04/2021 8:45 A [name] Medical Record Number: [id number] Patient Account Number: [id number] Date of Birth/Sex: Treating RN: [birth year] (60 y.o. [name] Primary Care Provider: [name] Other Clinician: Referring Provider: Treating Provider/Extender: Debbra Riding in Treatment: 4 Visit Information History Since Last Visit Added or deleted any medications: No Patient Arrived: Wheel Chair Any new allergies or adverse reactions: No Arrival Time: 08:54 Had a fall or experienced change in No Accompanied By: husband activities of daily living that may affect Transfer Assistance: None risk of falls: Patient Identification Verified: Yes Signs or symptoms of abuse/neglect since last visito No Secondary Verification Process Completed: Yes Hospitalized since last visit: No Patient Requires Transmission-Based Precautions: No Implantable device outside of the clinic excluding No Patient Has Alerts: Yes cellular tissue based products placed in the center Patient Alerts: [place] [facility] 03/2021 since last visit: ABI: 1.24 Has Dressing in Place as Prescribed: Yes Pain Present Now: Yes Electronic Signature(s) Signed: 05/04/2021 9:12:52 AM By: Sandre Kitty Entered By: Sandre Kitty on 05/04/2021 08:54:26 -------------------------------------------------------------------------------- Compression Therapy Details Patient Name: Date of Service: [name] 05/04/2021 8:45 A [name] Medical Record Number: [id number] Patient Account Number: [id number] Date of Birth/Sex: Treating RN: 11/01/1960 (60 y.o. [name] Primary Care Provider: [name] Other Clinician: Referring Provider: Treating Provider/Extender: Debbra Riding in Treatment: 4 Compression Therapy Performed for  Wound Assessment: Wound #11 Right,Dorsal Foot Performed By: Clinician Lorrin Jackson, RN Compression Type: Double Layer Post Procedure Diagnosis Same as Pre-procedure Electronic Signature(s) Signed: 05/04/2021 5:22:48 PM By: Lorrin Jackson Entered By: Lorrin Jackson on 05/04/2021 09:21:03 -------------------------------------------------------------------------------- Compression Therapy Details Patient Name: Date of Service: [name] 05/04/2021 8:45 A [name] Medical Record Number: [id number] Patient Account Number: [id number] Date of Birth/Sex: Treating RN: September 16, 1960 (60 y.o. [name] Primary Care Provider: [name] Other Clinician: Referring Provider: Treating Provider/Extender: Debbra Riding in Treatment: 4 Compression Therapy Performed for Wound Assessment: Wound #8 Right,Plantar Foot Performed By: Clinician Lorrin Jackson, RN Compression Type: Double Layer Post Procedure Diagnosis Same as Pre-procedure Electronic Signature(s) Signed: 05/04/2021 5:22:48 PM By: Lorrin Jackson Entered By: Lorrin Jackson on 05/04/2021 09:21:03 -------------------------------------------------------------------------------- Compression Therapy Details Patient Name: Date of Service: [name] 05/04/2021 8:45 A [name] Medical Record Number: [id number] Patient Account Number: [id number] Date of Birth/Sex: Treating RN: 1961-01-25 (60 y.o. 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 05/04/2021 8:45 A [name] Medical Record Number: [id number] Patient Account Number: [id number] Date of Birth/Sex: Treating RN: 1961-07-28 (60 y.o. [name] Primary Care Provider: [name] Other Clinician: Referring Provider: Treating Provider/Extender: Debbra Riding in Treatment: 4 Encounter Discharge Information Items Post Procedure Vitals Discharge Condition: Stable Temperature (F): 98.4 Ambulatory Status: Wheelchair Pulse (bpm): 88 Discharge Destination: Home Respiratory Rate (breaths/min): 17 Transportation: Private Auto Blood Pressure (mmHg): 156/74 Accompanied By: Husband Schedule Follow-up Appointment: Yes Clinical Summary of Care: Provided on  05/04/2021 Form Type Recipient Paper Patient Patient Electronic Signature(s) Signed: 05/04/2021 5:22:48 PM By: Lorrin Jackson Entered By: Lorrin Jackson on 05/04/2021 09:35:27 -------------------------------------------------------------------------------- Lower Extremity Assessment Details Patient Name: Date of Service: [name] 05/04/2021 8:45 A [name] Medical Record Number: [id number] Patient Account Number: [id number] Date of Birth/Sex: Treating RN: 12-01-1960 (60 y.o. [name] Primary Care Provider: [name] Other Clinician: Referring Provider: Treating Provider/Extender: Debbra Riding in Treatment: 4 Edema Assessment Assessed: [Left: No] [Right: Yes] Edema: [Left: Ye] [Right: s] Calf Left: Right: Point of Measurement: 36 cm From Medial Instep 42.8 cm Ankle Left: Right: Point of Measurement: 10 cm From Medial Instep 23.8 cm Vascular Assessment Pulses: Dorsalis Pedis Palpable: [Right:Yes] Electronic Signature(s) Signed: 05/04/2021 5:22:48 PM By:  Lorrin Jackson Entered By: Lorrin Jackson on 05/04/2021 09:05:14 -------------------------------------------------------------------------------- Multi Wound Chart Details Patient Name: Date of Service: [name] 05/04/2021 8:45 A [name] Medical Record Number: [id number] Patient Account Number: [id number] Date of Birth/Sex: Treating RN: 12/27/1960 (60 y.o. [name] Primary Care Provider: [name] Other Clinician: Referring Provider: Treating Provider/Extender: Debbra Riding in Treatment: 4 Vital Signs Height(in): 62 Capillary Blood Glucose(mg/dl): 107 Weight(lbs): 323 Pulse(bpm): 56 Body Mass Index(BMI): 18 Blood Pressure(mmHg): 156/74 Temperature(F): 98.4 Respiratory Rate(breaths/min): 17 Photos: Left Perineum Right, Dorsal Foot Left, Anterior Breast Wound Location: Other Lesion Gradually Appeared Blister Wounding Event: Lesion Diabetic Wound/Ulcer of the Lower Lesion Primary Etiology: Extremity Cataracts, Lymphedema, Asthma, Cataracts, Lymphedema, Asthma, Cataracts, Lymphedema, Asthma, Comorbid History: Congestive Heart Failure, Coronary Congestive Heart Failure, Coronary Congestive Heart Failure, Coronary Artery Disease, Hypertension, Type II Artery Disease, Hypertension, Type II Artery Disease, Hypertension, Type II Diabetes, Rheumatoid Arthritis, Diabetes, Rheumatoid Arthritis, Diabetes, Rheumatoid Arthritis, Neuropathy Neuropathy Neuropathy 01/18/2021 04/27/2021 04/28/2021 Date Acquired: 2 1 0 Weeks of Treatment: Open Open Open Wound Status: 1.2x1.3x0.1 1x2.4x0.1 2x1.7x0.1 Measurements L x W x D (cm) 1.225 1.885 2.67 A (cm) : rea 0.123 0.188 0.267 Volume (cm) : -20.00% 42.90% 0.00% % Reduction in A rea: 39.70% 43.00% 0.00% % Reduction in Volume: Full Thickness Without Exposed Grade 2 Full Thickness Without Exposed Classification: Support Structures Support Structures Medium Medium Medium Exudate A  mount: Serosanguineous Serosanguineous Serosanguineous Exudate Type: red, brown red, brown red, brown Exudate Color: Distinct, outline attached Distinct, outline attached Distinct, outline attached Wound Margin: Large (67-100%) Large (67-100%) Medium (34-66%) Granulation A mount: Red, Pink, Hyper-granulation Red, Pink Red Granulation Quality: None Present (0%) None Present (0%) Medium (34-66%) Necrotic A mount: N/A N/A Eschar, Adherent Slough Necrotic Tissue: Fat Layer (Subcutaneous Tissue): Yes Fascia: No Fat Layer (Subcutaneous Tissue): Yes Exposed Structures: Fascia: No Fat Layer (Subcutaneous Tissue): No Fascia: No Tendon: No Tendon: No Tendon: No Muscle: No Muscle: No Muscle: No Joint: No Joint: No Joint: No Bone: No Bone: No Bone: No None None None Epithelialization: N/A N/A Chemical/Enzymatic/Mechanical Debridement: Pre-procedure Verification/Time Out N/A N/A 09:29 Taken: N/A N/A N/A Instrument: N/A N/A None Bleeding: N/A N/A Procedure was tolerated well Debridement Treatment Response: N/A N/A 2x1.7x0.1 Post Debridement Measurements L x W x D (cm) N/A N/A 0.267 Post Debridement Volume: (cm) N/A N/A N/A Assessment Notes: N/A Compression Therapy Debridement Procedures Performed: Wound Number: 8 9 N/A Photos: N/A Right, Plantar Foot Right Amputation Site - Toe N/A Wound Location: Gradually Appeared Surgical Injury N/A Wounding Event: Diabetic Wound/Ulcer of the Lower Open Surgical Wound N/A Primary Etiology: Extremity Cataracts, Lymphedema, Asthma, Cataracts, Lymphedema, Asthma, N/A Comorbid History: Congestive Heart Failure, Coronary Congestive Heart Failure, Coronary Artery  Disease, Hypertension, Type II Artery Disease, Hypertension, Type II Diabetes, Rheumatoid Arthritis, Diabetes, Rheumatoid Arthritis, Neuropathy Neuropathy 11/09/2020 01/30/2020 N/A Date Acquired: 4 4 N/A Weeks of Treatment: Open Open N/A Wound Status: 1.5x1.1x0.2  0.2x0.2x0.2 N/A Measurements L x W x D (cm) 1.296 0.031 N/A A (cm) : rea 0.259 0.006 N/A Volume (cm) : 8.30% 90.60% N/A % Reduction in Area: 8.50% 90.90% N/A % Reduction in Volume: Grade 3 Full Thickness Without Exposed N/A Classification: Support Structures Medium Medium N/A Exudate A mount: Serosanguineous Serosanguineous N/A Exudate Type: red, brown red, brown N/A Exudate Color: Well defined, not attached Distinct, outline attached N/A Wound Margin: Large (67-100%) Large (67-100%) N/A Granulation A mount: Pink Pink N/A Granulation Quality: None Present (0%) None Present (0%) N/A Necrotic A mount: N/A N/A N/A Necrotic Tissue: Fat Layer (Subcutaneous Tissue): Yes Fat Layer (Subcutaneous Tissue): Yes N/A Exposed Structures: Fascia: No Fascia: No Tendon: No Tendon: No Muscle: No Muscle: No Joint: No Joint: No Bone: No Bone: No Small (1-33%) Large (67-100%) N/A Epithelialization: N/A N/A N/A Debridement: N/A N/A N/A Instrument: N/A N/A N/A Bleeding: Debridement Treatment Response: N/A N/A N/A Post Debridement Measurements L x N/A N/A N/A W x D (cm) N/A N/A N/A Post Debridement Volume: (cm) Calloused Periwound N/A N/A Assessment Notes: Compression Therapy Compression Therapy N/A Procedures Performed: Treatment Notes Wound #10 (Perineum) Wound Laterality: Left Cleanser Soap and Water Discharge Instruction: May shower and wash wound with dial antibacterial soap and water prior to dressing change. Wound Cleanser Discharge Instruction: Cleanse the wound with wound cleanser prior to applying a clean dressing using gauze sponges, not tissue or cotton balls. Peri-Wound Care Topical Primary Dressing KerraCel Ag Gelling Fiber Dressing, 2x2 in (silver alginate) Discharge Instruction: Apply silver alginate to wound bed as instructed Secondary Dressing Woven Gauze Sponges 2x2 in Discharge Instruction: Apply over primary dressing as directed. Secured  With tegaderm tranparent film dressing 2x3 in Compression Wrap Compression Stockings Add-Ons Wound #11 (Foot) Wound Laterality: Dorsal, Right Cleanser Soap and Water Discharge Instruction: May shower and wash wound with dial antibacterial soap and water prior to dressing change. Wound Cleanser Discharge Instruction: Cleanse the wound with wound cleanser prior to applying a clean dressing using gauze sponges, not tissue or cotton balls. Peri-Wound Care Zinc Oxide Ointment 30g tube Discharge Instruction: Apply Zinc Oxide to periwound with each dressing change Topical Primary Dressing KerraCel Ag Gelling Fiber Dressing, 2x2 in (silver alginate) Discharge Instruction: Apply silver alginate to wound bed as instructed Secondary Dressing Woven Gauze Sponge, Non-Sterile 4x4 in Discharge Instruction: Apply over primary dressing as directed. Secured With Compression Wrap CoFlex TLC XL 2-layer Compression System 4x7 (in/yd) Discharge Instruction: Apply CoFlex 2-layer compression as directed. (alt for 4 layer) Compression Stockings Add-Ons Wound #12 (Breast) Wound Laterality: Left, Anterior Cleanser Soap and Water Discharge Instruction: May shower and wash wound with dial antibacterial soap and water prior to dressing change. Wound Cleanser Discharge Instruction: Cleanse the wound with wound cleanser prior to applying a clean dressing using gauze sponges, not tissue or cotton balls. Peri-Wound Care Topical Primary Dressing Santyl Ointment Discharge Instruction: Apply nickel thick amount to wound bed as instructed Secondary Dressing Woven Gauze Sponges 2x2 in Discharge Instruction: Cover Santyl with saline moistened 2x2 Zetuvit Plus Silicone Border Dressing 4x4 (in/in) Discharge Instruction: Apply silicone border over primary dressing as directed. Secured With Compression Wrap Compression Stockings Add-Ons Wound #8 (Foot) Wound Laterality: Plantar, Right Cleanser Peri-Wound  Care Sween Lotion (Moisturizing lotion) Discharge Instruction: Apply moisturizing lotion as directed Topical Primary  Dressing KerraCel Ag Gelling Fiber Dressing, 2x2 in (silver alginate) Discharge Instruction: Apply silver alginate. Secondary Dressing Woven Gauze Sponge, Non-Sterile 4x4 in Discharge Instruction: Apply over primary dressing as directed. ABD Pad, 5x9 Discharge Instruction: Apply over primary dressing as directed. Optifoam Non-Adhesive Dressing, 4x4 in Discharge Instruction: Apply over primary dressing cut to form donut Secured With Compression Wrap CoFlex TLC XL 2-layer Compression System 4x7 (in/yd) Discharge Instruction: Apply CoFlex 2-layer compression as directed. (alt for 4 layer) Compression Stockings Add-Ons Wound #9 (Amputation Site - Toe) Wound Laterality: Right Cleanser Soap and Water Discharge Instruction: May shower and wash wound with dial antibacterial soap and water prior to dressing change. Wound Cleanser Discharge Instruction: Cleanse the wound with wound cleanser prior to applying a clean dressing using gauze sponges, not tissue or cotton balls. Peri-Wound Care Sween Lotion (Moisturizing lotion) Discharge Instruction: Apply moisturizing lotion as directed Topical Primary Dressing KerraCel Ag Gelling Fiber Dressing, 2x2 in (silver alginate) Discharge Instruction: Apply silver alginate to wound bed as instructed Secondary Dressing Woven Gauze Sponge, Non-Sterile 4x4 in Discharge Instruction: Apply over primary dressing as directed. ABD Pad, 5x9 Discharge Instruction: Apply over primary dressing as directed. Secured With Compression Wrap CoFlex TLC XL 2-layer Compression System 4x7 (in/yd) Discharge Instruction: Apply CoFlex 2-layer compression as directed. (alt for 4 layer) Compression Stockings Add-Ons Electronic Signature(s) Signed: 05/04/2021 10:07:33 AM By: Lorrin Jackson Entered By: Lorrin Jackson on 05/04/2021  10:07:33 -------------------------------------------------------------------------------- Multi-Disciplinary Care Plan Details Patient Name: Date of Service: [place], DA [name] 05/04/2021 8:45 A [name] Medical Record Number: [id number] Patient Account Number: [id number] Date of Birth/Sex: Treating RN: 11/14/1960 (60 y.o. [name] Primary Care Fusaye Wachtel: [name] Other Clinician: Referring Braelon Sprung: Treating Zeena Starkel/Extender: Debbra Riding in Treatment: 4 Multidisciplinary Care Plan reviewed with physician Active Inactive HBO Nursing Diagnoses: Potential for barotraumas to ears, sinuses, teeth, and lungs or cerebral gas embolism related to changes in atmospheric pressure inside hyperbaric oxygen chamber Potential for oxygen toxicity seizures related to delivery of 100% oxygen at an increased atmospheric pressure Potential for pulmonary oxygen toxicity related to delivery of 100% oxygen at an increased atmospheric pressure Goals: Patient will tolerate the hyperbaric oxygen therapy treatment Date Initiated: 04/06/2021 Target Resolution Date: 06/01/2021 Goal Status: Active Patient/caregiver will verbalize understanding of HBO goals, rationale, procedures and potential hazards Date Initiated: 04/06/2021 Target Resolution Date: 06/01/2021 Goal Status: Active Interventions: Administer a five (5) minute air break for patient if signs and symptoms of seizure appear and notify the hyperbaric physician Assess and provide for patients comfort related to the hyperbaric environment and equalization of middle ear Assess for signs and symptoms related to adverse events, including but not limited to confinement anxiety, pneumothorax, oxygen toxicity and baurotrauma Notes: 05/04/21: Patient begin HBO 05/09/21. Nutrition Nursing Diagnoses: Impaired glucose control: actual or potential Potential for alteratiion in Nutrition/Potential for imbalanced  nutrition Goals: Patient/caregiver will maintain therapeutic glucose control Date Initiated: 04/06/2021 Target Resolution Date: 06/01/2021 Goal Status: Active Interventions: Assess HgA1c results as ordered upon admission and as needed Assess patient nutrition upon admission and as needed per policy Provide education on elevated blood sugars and impact on wound healing Treatment Activities: Patient referred to Primary Care Physician for further nutritional evaluation : 04/06/2021 Notes: 05/04/21: Glucose control ongoing. Wound/Skin Impairment Nursing Diagnoses: Impaired tissue integrity Knowledge deficit related to ulceration/compromised skin integrity Goals: Patient/caregiver will verbalize understanding of skin care regimen Date Initiated: 04/06/2021 Date Inactivated: 05/04/2021 Target Resolution Date: 05/04/2021 Goal Status: Met Ulcer/skin breakdown will have a  volume reduction of 30% by week 4 Date Initiated: 04/06/2021 Target Resolution Date: 05/04/2021 Goal Status: Active Interventions: Assess patient/caregiver ability to obtain necessary supplies Assess patient/caregiver ability to perform ulcer/skin care regimen upon admission and as needed Assess ulceration(s) every visit Provide education on ulcer and skin care Treatment Activities: Skin care regimen initiated : 04/06/2021 Topical wound management initiated : 04/06/2021 Notes: Electronic Signature(s) Signed: 05/04/2021 5:22:48 PM By: Lorrin Jackson Entered By: Lorrin Jackson on 05/04/2021 09:06:39 -------------------------------------------------------------------------------- Pain Assessment Details Patient Name: Date of Service: [place], DA [name] 05/04/2021 8:45 A [name] Medical Record Number: [id number] Patient Account Number: [id number] Date of Birth/Sex: Treating RN: 1960-11-21 (60 y.o. [name] Primary Care Provider: [name] Other Clinician: Referring Provider: Treating Provider/Extender: Debbra Riding in Treatment: 4 Active Problems Location of Pain Severity and Description of Pain Patient Has Paino Yes Site Locations Rate the pain. Current Pain Level: 4 Pain Management and Medication Current Pain Management: Electronic Signature(s) Signed: 05/04/2021 9:12:52 AM By: Sandre Kitty Signed: 05/04/2021 5:35:19 PM By: Baruch Gouty RN, BSN Entered By: Sandre Kitty on 05/04/2021 08:56:34 -------------------------------------------------------------------------------- Patient/Caregiver Education Details Patient Name: Date of Service: [place], DA [name] 8/24/2022andnbsp8:45 A [name] Medical Record Number: [id number] Patient Account Number: [id number] Date of Birth/Gender: Treating RN: 27-May-1961 (60 y.o. [name] Primary Care Physician: [name] Other Clinician: Referring Physician: Treating Physician/Extender: Debbra Riding in Treatment: 4 Education Assessment Education Provided To: Patient Education Topics Provided Elevated Blood Sugar/ Impact on Healing: Methods: Explain/Verbal Responses: State content correctly Venous: Methods: Explain/Verbal, Printed Responses: State content correctly Wound/Skin Impairment: Methods: Demonstration, Explain/Verbal, Printed Responses: State content correctly Electronic Signature(s) Signed: 05/04/2021 5:22:48 PM By: Lorrin Jackson Entered By: Lorrin Jackson on 05/04/2021 09:07:05 -------------------------------------------------------------------------------- Wound Assessment Details Patient Name: Date of Service: [place], DA [name] 05/04/2021 8:45 A [name] Medical Record Number: [id number] Patient Account Number: [id number] Date of Birth/Sex: Treating RN: [birth year] (60 y.o. [name] Primary Care Provider: [name] Other Clinician: Referring Provider: Treating Provider/Extender: Debbra Riding in Treatment:  4 Wound Status Wound Number: 10 Primary Lesion Etiology: Wound Location: Left Perineum Wound Open Wounding Event: Other Lesion Status: Date Acquired: 01/18/2021 Comorbid Cataracts, Lymphedema, Asthma, Congestive Heart Failure, Weeks Of Treatment: 2 History: Coronary Artery Disease, Hypertension, Type II Diabetes, Clustered Wound: No Rheumatoid Arthritis, Neuropathy Photos Wound Measurements Length: (cm) 1.2 Width: (cm) 1.3 Depth: (cm) 0.1 Area: (cm) 1.225 Volume: (cm) 0.123 % Reduction in Area: -20% % Reduction in Volume: 39.7% Epithelialization: None Tunneling: No Undermining: No Wound Description Classification: Full Thickness Without Exposed Support Structures Wound Margin: Distinct, outline attached Exudate Amount: Medium Exudate Type: Serosanguineous Exudate Color: red, brown Foul Odor After Cleansing: No Slough/Fibrino No Wound Bed Granulation Amount: Large (67-100%) Exposed Structure Granulation Quality: Red, Pink, Hyper-granulation Fascia Exposed: No Necrotic Amount: None Present (0%) Fat Layer (Subcutaneous Tissue) Exposed: Yes Tendon Exposed: No Muscle Exposed: No Joint Exposed: No Bone Exposed: No Treatment Notes Wound #10 (Perineum) Wound Laterality: Left Cleanser Soap and Water Discharge Instruction: May shower and wash wound with dial antibacterial soap and water prior to dressing change. Wound Cleanser Discharge Instruction: Cleanse the wound with wound cleanser prior to applying a clean dressing using gauze sponges, not tissue or cotton balls. Peri-Wound Care Topical Primary Dressing KerraCel Ag Gelling Fiber Dressing, 2x2 in (silver alginate) Discharge Instruction: Apply silver alginate to wound bed as instructed Secondary Dressing Woven Gauze Sponges 2x2 in Discharge Instruction: Apply over primary dressing  as directed. Secured With tegaderm tranparent film dressing 2x3 in Compression Wrap Compression Stockings Add-Ons Electronic  Signature(s) Signed: 05/04/2021 5:22:48 PM By: Lorrin Jackson Signed: 05/04/2021 5:35:19 PM By: Baruch Gouty RN, BSN Entered By: Lorrin Jackson on 05/04/2021 09:08:06 -------------------------------------------------------------------------------- Wound Assessment Details Patient Name: Date of Service: [place]an, DA [name] 05/04/2021 8:45 A [name] Medical Record Number: [id number] Patient Account Number: [id number] Date of Birth/Sex: Treating RN: October 09, 1960 (60 y.o. [name] Primary Care Provider: [name] Other Clinician: Referring Provider: Treating Provider/Extender: Debbra Riding in Treatment: 4 Wound Status Wound Number: 11 Primary Diabetic Wound/Ulcer of the Lower Extremity Etiology: Wound Location: Right, Dorsal Foot Wound Open Wounding Event: Gradually Appeared Status: Date Acquired: 04/27/2021 Comorbid Cataracts, Lymphedema, Asthma, Congestive Heart Failure, Weeks Of Treatment: 1 History: Coronary Artery Disease, Hypertension, Type II Diabetes, Clustered Wound: No Rheumatoid Arthritis, Neuropathy Photos Wound Measurements Length: (cm) 1 Width: (cm) 2.4 Depth: (cm) 0.1 Area: (cm) 1.885 Volume: (cm) 0.188 % Reduction in Area: 42.9% % Reduction in Volume: 43% Epithelialization: None Tunneling: No Undermining: No Wound Description Classification: Grade 2 Wound Margin: Distinct, outline attached Exudate Amount: Medium Exudate Type: Serosanguineous Exudate Color: red, brown Foul Odor After Cleansing: No Slough/Fibrino No Wound Bed Granulation Amount: Large (67-100%) Exposed Structure Granulation Quality: Red, Pink Fascia Exposed: No Necrotic Amount: None Present (0%) Fat Layer (Subcutaneous Tissue) Exposed: No Tendon Exposed: No Muscle Exposed: No Joint Exposed: No Bone Exposed: No Treatment Notes Wound #11 (Foot) Wound Laterality: Dorsal, Right Cleanser Soap and Water Discharge Instruction: May shower and wash  wound with dial antibacterial soap and water prior to dressing change. Wound Cleanser Discharge Instruction: Cleanse the wound with wound cleanser prior to applying a clean dressing using gauze sponges, not tissue or cotton balls. Peri-Wound Care Zinc Oxide Ointment 30g tube Discharge Instruction: Apply Zinc Oxide to periwound with each dressing change Topical Primary Dressing KerraCel Ag Gelling Fiber Dressing, 2x2 in (silver alginate) Discharge Instruction: Apply silver alginate to wound bed as instructed Secondary Dressing Woven Gauze Sponge, Non-Sterile 4x4 in Discharge Instruction: Apply over primary dressing as directed. Secured With Compression Wrap CoFlex TLC XL 2-layer Compression System 4x7 (in/yd) Discharge Instruction: Apply CoFlex 2-layer compression as directed. (alt for 4 layer) Compression Stockings Add-Ons Electronic Signature(s) Signed: 05/04/2021 5:22:48 PM By: Lorrin Jackson Signed: 05/04/2021 5:35:19 PM By: Baruch Gouty RN, BSN Entered By: Lorrin Jackson on 05/04/2021 09:08:32 -------------------------------------------------------------------------------- Wound Assessment Details Patient Name: Date of Service: Nakamura, DA [name] 05/04/2021 8:45 A [name] Medical Record Number: [id number] Patient Account Number: [id number] Date of Birth/Sex: Treating RN: 07-24-1961 (59 y.o. [name] Primary Care Provider: [name] Other Clinician: Referring Provider: Treating Provider/Extender: Debbra Riding in Treatment: 4 Wound Status Wound Number: 12 Primary Lesion Etiology: Wound Location: Left, Anterior Breast Wound Open Wounding Event: Blister Status: Date Acquired: 04/28/2021 Comorbid Cataracts, Lymphedema, Asthma, Congestive Heart Failure, Weeks Of Treatment: 0 History: Coronary Artery Disease, Hypertension, Type II Diabetes, Clustered Wound: No Clustered Wound: No Rheumatoid Arthritis, Neuropathy Photos Wound  Measurements Length: (cm) 2 Width: (cm) 1.7 Depth: (cm) 0.1 Area: (cm) 2.67 Volume: (cm) 0.267 % Reduction in Area: 0% % Reduction in Volume: 0% Epithelialization: None Tunneling: No Undermining: No Wound Description Classification: Full Thickness Without Exposed Support Structures Wound Margin: Distinct, outline attached Exudate Amount: Medium Exudate Type: Serosanguineous Exudate Color: red, brown Foul Odor After Cleansing: No Slough/Fibrino Yes Wound Bed Granulation Amount: Medium (34-66%) Exposed Structure Granulation Quality: Red Fascia Exposed: No Necrotic Amount:  Medium (34-66%) Fat Layer (Subcutaneous Tissue) Exposed: Yes Necrotic Quality: Eschar, Adherent Slough Tendon Exposed: No Muscle Exposed: No Joint Exposed: No Bone Exposed: No Electronic Signature(s) Signed: 05/04/2021 5:22:48 PM By: Lorrin Jackson Signed: 05/05/2021 7:44:17 AM By: Sandre Kitty Entered By: Sandre Kitty on 05/04/2021 09:13:32 -------------------------------------------------------------------------------- Wound Assessment Details Patient Name: Date of Service: Brackens, DA [name] 05/04/2021 8:45 A [name] Medical Record Number: [id number] Patient Account Number: [id number] Date of Birth/Sex: Treating RN: 03-07-1961 (60 y.o. [name] Primary Care Maritza Hosterman: [name] Other Clinician: Referring Chibuike Fleek: Treating Mylin Gignac/Extender: Debbra Riding in Treatment: 4 Wound Status Wound Number: 8 Primary Diabetic Wound/Ulcer of the Lower Extremity Etiology: Wound Location: Right, Plantar Foot Wound Open Wounding Event: Gradually Appeared Status: Date Acquired: 11/09/2020 Comorbid Cataracts, Lymphedema, Asthma, Congestive Heart Failure, Weeks Of Treatment: 4 History: Coronary Artery Disease, Hypertension, Type II Diabetes, Clustered Wound: No Rheumatoid Arthritis, Neuropathy Photos Wound Measurements Length: (cm) 1.5 Width: (cm) 1.1 Depth:  (cm) 0.2 Area: (cm) 1.296 Volume: (cm) 0.259 % Reduction in Area: 8.3% % Reduction in Volume: 8.5% Epithelialization: Small (1-33%) Tunneling: No Undermining: No Wound Description Classification: Grade 3 Wound Margin: Well defined, not attached Exudate Amount: Medium Exudate Type: Serosanguineous Exudate Color: red, brown Foul Odor After Cleansing: No Slough/Fibrino No Wound Bed Granulation Amount: Large (67-100%) Exposed Structure Granulation Quality: Pink Fascia Exposed: No Necrotic Amount: None Present (0%) Fat Layer (Subcutaneous Tissue) Exposed: Yes Tendon Exposed: No Muscle Exposed: No Joint Exposed: No Bone Exposed: No Assessment Notes Calloused Periwound Treatment Notes Wound #8 (Foot) Wound Laterality: Plantar, Right Cleanser Peri-Wound Care Sween Lotion (Moisturizing lotion) Discharge Instruction: Apply moisturizing lotion as directed Topical Primary Dressing KerraCel Ag Gelling Fiber Dressing, 2x2 in (silver alginate) Discharge Instruction: Apply silver alginate. Secondary Dressing Woven Gauze Sponge, Non-Sterile 4x4 in Discharge Instruction: Apply over primary dressing as directed. ABD Pad, 5x9 Discharge Instruction: Apply over primary dressing as directed. Optifoam Non-Adhesive Dressing, 4x4 in Discharge Instruction: Apply over primary dressing cut to form donut Secured With Compression Wrap CoFlex TLC XL 2-layer Compression System 4x7 (in/yd) Discharge Instruction: Apply CoFlex 2-layer compression as directed. (alt for 4 layer) Compression Stockings Add-Ons Electronic Signature(s) Signed: 05/04/2021 5:22:48 PM By: Lorrin Jackson Signed: 05/04/2021 5:35:19 PM By: Baruch Gouty RN, BSN Entered By: Lorrin Jackson on 05/04/2021 09:09:36 -------------------------------------------------------------------------------- Wound Assessment Details Patient Name: Date of Service: Schriver, DA [name] 05/04/2021 8:45 A [name] Medical Record Number:  [id number] Patient Account Number: [id number] Date of Birth/Sex: Treating RN: 1961-03-18 (60 y.o. [name] Primary Care Santhiago Collingsworth: [name] Other Clinician: Referring Shaheer Bonfield: Treating Harlo Jaso/Extender: Debbra Riding in Treatment: 4 Wound Status Wound Number: 9 Primary Open Surgical Wound Etiology: Wound Location: Right Amputation Site - Toe Wound Open Wounding Event: Surgical Injury Status: Date Acquired: 01/30/2020 Comorbid Cataracts, Lymphedema, Asthma, Congestive Heart Failure, Weeks Of Treatment: 4 History: Coronary Artery Disease, Hypertension, Type II Diabetes, Clustered Wound: No Rheumatoid Arthritis, Neuropathy Photos Wound Measurements Length: (cm) 0.2 Width: (cm) 0.2 Depth: (cm) 0.2 Area: (cm) 0.031 Volume: (cm) 0.006 % Reduction in Area: 90.6% % Reduction in Volume: 90.9% Epithelialization: Large (67-100%) Tunneling: No Undermining: No Wound Description Classification: Full Thickness Without Exposed Support Structures Wound Margin: Distinct, outline attached Exudate Amount: Medium Exudate Type: Serosanguineous Exudate Color: red, brown Foul Odor After Cleansing: No Slough/Fibrino No Wound Bed Granulation Amount: Large (67-100%) Exposed Structure Granulation Quality: Pink Fascia Exposed: No Necrotic Amount: None Present (0%) Fat Layer (Subcutaneous Tissue) Exposed: Yes Tendon Exposed: No Muscle  Exposed: No Joint Exposed: No Bone Exposed: No Treatment Notes Wound #9 (Amputation Site - Toe) Wound Laterality: Right Cleanser Soap and Water Discharge Instruction: May shower and wash wound with dial antibacterial soap and water prior to dressing change. Wound Cleanser Discharge Instruction: Cleanse the wound with wound cleanser prior to applying a clean dressing using gauze sponges, not tissue or cotton balls. Peri-Wound Care Sween Lotion (Moisturizing lotion) Discharge Instruction: Apply moisturizing  lotion as directed Topical Primary Dressing KerraCel Ag Gelling Fiber Dressing, 2x2 in (silver alginate) Discharge Instruction: Apply silver alginate to wound bed as instructed Secondary Dressing Woven Gauze Sponge, Non-Sterile 4x4 in Discharge Instruction: Apply over primary dressing as directed. ABD Pad, 5x9 Discharge Instruction: Apply over primary dressing as directed. Secured With Compression Wrap CoFlex TLC XL 2-layer Compression System 4x7 (in/yd) Discharge Instruction: Apply CoFlex 2-layer compression as directed. (alt for 4 layer) Compression Stockings Add-Ons Electronic Signature(s) Signed: 05/04/2021 5:22:48 PM By: Lorrin Jackson Signed: 05/04/2021 5:35:19 PM By: Baruch Gouty RN, BSN Entered By: Lorrin Jackson on 05/04/2021 09:09:59 -------------------------------------------------------------------------------- [place]i Details Patient Name: Date of Service: Rickerson, DA [name] 05/04/2021 8:45 A [name] Medical Record Number: [id number] Patient Account Number: [id number] Date of Birth/Sex: Treating RN: 1960-11-03 (60 y.o. [name] Primary Care Kanye Depree: [name] Other Clinician: Referring Vincenzina Jagoda: Treating Takeo Harts/Extender: Debbra Riding in Treatment: 4 Vital Signs Time Taken: 08:56 Temperature (F): 98.4 Height (in): 62 Pulse (bpm): 88 Weight (lbs): 323 Respiratory Rate (breaths/min): 17 Body Mass Index (BMI): 59.1 Blood Pressure (mmHg): 156/74 Capillary Blood Glucose (mg/dl): 107 Reference Range: 80 - 120 mg / dl Electronic Signature(s) Signed: 05/04/2021 9:12:52 AM By: Sandre Kitty Entered By: Sandre Kitty on 05/04/2021 08:56:25

## 2021-05-09 ENCOUNTER — Encounter (HOSPITAL_BASED_OUTPATIENT_CLINIC_OR_DEPARTMENT_OTHER): Payer: 59 | Admitting: Internal Medicine

## 2021-05-09 ENCOUNTER — Other Ambulatory Visit: Payer: Self-pay

## 2021-05-09 DIAGNOSIS — E11621 Type 2 diabetes mellitus with foot ulcer: Secondary | ICD-10-CM

## 2021-05-09 DIAGNOSIS — Z89421 Acquired absence of other right toe(s): Secondary | ICD-10-CM | POA: Diagnosis not present

## 2021-05-09 DIAGNOSIS — L97512 Non-pressure chronic ulcer of other part of right foot with fat layer exposed: Secondary | ICD-10-CM | POA: Diagnosis not present

## 2021-05-09 LAB — GLUCOSE, CAPILLARY
Glucose-Capillary: 106 mg/dL — ABNORMAL HIGH (ref 70–99)
Glucose-Capillary: 116 mg/dL — ABNORMAL HIGH (ref 70–99)
Glucose-Capillary: 101 mg/dL — ABNORMAL HIGH (ref 70–99)

## 2021-05-10 ENCOUNTER — Encounter (HOSPITAL_BASED_OUTPATIENT_CLINIC_OR_DEPARTMENT_OTHER): Payer: 59 | Admitting: Internal Medicine

## 2021-05-10 DIAGNOSIS — L97512 Non-pressure chronic ulcer of other part of right foot with fat layer exposed: Secondary | ICD-10-CM

## 2021-05-10 DIAGNOSIS — E11621 Type 2 diabetes mellitus with foot ulcer: Secondary | ICD-10-CM

## 2021-05-10 LAB — GLUCOSE, CAPILLARY
Glucose-Capillary: 195 mg/dL — ABNORMAL HIGH (ref 70–99)
Glucose-Capillary: 185 mg/dL — ABNORMAL HIGH (ref 70–99)

## 2021-05-10 NOTE — Progress Notes (Signed)
Doering, JENNIFR GAETA ([id number]) Visit Report for 05/10/2021 Arrival Information Details Patient Name: Date of Service: [name], [name] 05/10/2021 8:00 A M Medical Record Number: [id number] Patient Account Number: [id number] Date of Birth/Sex: Treating RN: 1961-03-21 (60 y.o. [name], [name] Primary Care Suesan Mohrmann: [name] Other Clinician: Haywood Pao Referring Donnavin Vandenbrink: Treating Sir Mallis/Extender: Lynnell Catalan in Treatment: 4 Visit Information History Since Last Visit All ordered tests and consults were completed: Yes Patient Arrived: Wheel Chair Added or deleted any medications: No Arrival Time: 07:30 Any new allergies or adverse reactions: No Accompanied By: spouse Had a fall or experienced change in No Transfer Assistance: EasyPivot Patient Lift activities of daily living that may affect Patient Identification Verified: Yes risk of falls: Secondary Verification Process Completed: Yes Signs or symptoms of abuse/neglect since last visito No Patient Requires Transmission-Based Precautions: No Hospitalized since last visit: No Patient Has Alerts: Yes Implantable device outside of the clinic excluding No Patient Alerts: [place] [facility] 03/2021 cellular tissue based products placed in the center ABI: 1.24 since last visit: Pain Present Now: Yes Electronic Signature(s) Signed: 05/10/2021 1:07:07 PM By: Haywood Pao EMT Entered By: Haywood Pao on 05/10/2021 08:10:31 -------------------------------------------------------------------------------- Encounter Discharge Information Details Patient Name: Date of Service: [name], [name] 05/10/2021 8:00 A M Medical Record Number: [id number] Patient Account Number: [id number] Date of Birth/Sex: Treating RN: [birth year] (60 y.o. [name], [name] Primary Care Latham Kinzler: [name] Other Clinician: Haywood Pao Referring Harvard Zeiss: Treating Bonny Vanleeuwen/Extender:  Roselind Rily in Treatment: 4 Encounter Discharge Information Items Discharge Condition: Stable Ambulatory Status: Wheelchair Discharge Destination: Home Transportation: Private Auto Accompanied By: spouse Schedule Follow-up Appointment: Yes Clinical Summary of Care: Electronic Signature(s) Signed: 05/10/2021 1:07:07 PM By: Haywood Pao EMT Entered By: Haywood Pao on 05/10/2021 11:04:50 -------------------------------------------------------------------------------- Vitals Details Patient Name: Date of Service: [name], [name] 05/10/2021 8:00 A M Medical Record Number: [id number] Patient Account Number: [id number] Date of Birth/Sex: Treating RN: 12-Aug-1961 (60 y.o. [name], [name] Primary Care Robie Mcniel: [name] Other Clinician: Haywood Pao Referring Clancy Mullarkey: Treating Manju Kulkarni/Extender: Lynnell Catalan in Treatment: 4 Vital Signs Time Taken: 08:14 Temperature (F): 98.1 Height (in): 62 Pulse (bpm): 80 Weight (lbs): 323 Respiratory Rate (breaths/min): 18 Body Mass Index (BMI): 59.1 Blood Pressure (mmHg): 103/49 Capillary Blood Glucose (mg/dl): 009 Reference Range: 80 - 120 mg / dl Electronic Signature(s) Signed: 05/10/2021 1:07:07 PM By: Haywood Pao EMT Entered By: Haywood Pao on 05/10/2021 08:15:40

## 2021-05-10 NOTE — Progress Notes (Signed)
[name], [name] ([id number]) Visit Report for 05/10/2021 HBO Details Patient Name: Date of Service: [name], [name] 05/10/2021 8:00 A M Medical Record Number: [id number] Patient Account Number: [id number] Date of Birth/Sex: Treating RN: 1961/07/03 (60 y.o. [name], [name] Primary Care [name]: Arva [name] Other Clinician: Haywood [name] Referring [name]: Treating [name]/Extender: [name] in Treatment: 4 HBO Treatment Course Details Treatment Course Number: 1 Ordering Yessika Otte: [name] T Treatments Ordered: otal 40 HBO Treatment Start Date: 05/09/2021 HBO Indication: Diabetic Ulcer(s) of the Lower Extremity HBO Treatment Details Treatment Number: 2 Patient Type: Outpatient Chamber Type: Monoplace Chamber Serial #: [id number] Treatment Protocol: 2.0 ATA with 90 minutes oxygen, and no air breaks Treatment Details Compression Rate Down: 1.5 psi / minute De-Compression Rate Up: 2.0 psi / minute Air breaks and breathing Decompress Decompress Compress Tx Pressure Begins Reached periods Begins Ends (leave unused spaces blank) Chamber Pressure (ATA 1 2 ------2 1 ) Clock Time (24 hr) 08:34 08:44 - - - - - - 09:54 10:01 Treatment Length: 87 (minutes) Treatment Segments: 3 Vital Signs Capillary Blood Glucose Reference Range: 80 - 120 mg / dl HBO Diabetic Blood Glucose Intervention Range: <131 mg/dl or >553 mg/dl Time Vitals Blood Respiratory Capillary Blood Glucose Pulse Action Type: Pulse: Temperature: Taken: Pressure: Rate: Glucose (mg/dl): Meter #: Oximetry (%) Taken: Pre 08:14 103/49 80 18 98.1 195 Post 10:28 134/55 74 20 98 185 Treatment Response Treatment Toleration: Fair Treatment Completion Status: Treatment Aborted/Not Restarted Reason: Patient Choice Treatment Notes Patient tolerated treatment better today. Patient experienced back pain today. At approximately 0954 (20 minutes left of treatment) she  could no longer tolerate her discomfort and treatment was ended and patient was removed from the hyperbaric chamber at a rate of 2.0 psi/min, monitoring and coaching to avoid ear barotrauma. Patient denies any problems with her ears. Patient needs additional padding to avoid pain while lying in the chamber. She may need further assessment for confinement anxiety. Additional Procedure Documentation Tissue Sevierity: Fat layer exposed Physician HBO Attestation: I certify that I supervised this HBO treatment in accordance with Medicare guidelines. A trained emergency response team is readily available per Yes hospital policies and procedures. Continue HBOT as ordered. Yes Electronic Signature(s) Signed: 05/10/2021 12:45:26 PM By: [name] Corwin DO Entered By: [name] on 05/10/2021 12:44:55 -------------------------------------------------------------------------------- HBO Safety Checklist Details Patient Name: Date of Service: [name], [name] 05/10/2021 8:00 A M Medical Record Number: [id number] Patient Account Number: [id number] Date of Birth/Sex: Treating RN: [birth year] (60 y.o. [name], [name] Primary Care [name]: Arva [name] Other Clinician: Haywood [name] Referring [name]: Treating [name]/Extender: [name] in Treatment: 4 HBO Safety Checklist Items Safety Checklist Consent Form Signed Patient voided / foley secured and emptied When did you last eato 0630 Last dose of injectable or oral agent metformin 0630 insulin 0630 Ostomy pouch emptied and vented if applicable NA All implantable devices assessed, documented and approved NA Intravenous access site secured and place NA Valuables secured Linens and cotton and cotton/polyester blend (less than 51% polyester) Personal oil-based products / skin lotions / body lotions removed Wigs or hairpieces removed NA Smoking or tobacco materials removed NA Books /  newspapers / magazines / loose paper removed Cologne, aftershave, perfume and deodorant removed Jewelry removed (may wrap wedding band) Make-up removed Hair care products removed Battery operated devices (external) removed Heating patches and chemical warmers removed Titanium eyewear removed NA Nail polish cured greater than 10 hours NA Casting material cured greater  than 10 hours NA Hearing aids removed NA Loose dentures or partials removed NA Prosthetics have been removed NA Patient demonstrates correct use of air break device (if applicable) Patient concerns have been addressed Patient grounding bracelet on and cord attached to chamber Specifics for Inpatients (complete in addition to above) Medication sheet sent with patient NA Intravenous medications needed or due during therapy sent with patient NA Drainage tubes (e.g. nasogastric tube or chest tube secured and vented) NA Endotracheal or Tracheotomy tube secured NA Cuff deflated of air and inflated with saline NA Airway suctioned NA Electronic Signature(s) Signed: 05/10/2021 1:07:07 PM By: [name] EMT Entered By: [name] on 05/10/2021 08:30:05

## 2021-05-11 ENCOUNTER — Encounter (HOSPITAL_BASED_OUTPATIENT_CLINIC_OR_DEPARTMENT_OTHER): Payer: 59 | Admitting: Physician Assistant

## 2021-05-11 ENCOUNTER — Ambulatory Visit (HOSPITAL_BASED_OUTPATIENT_CLINIC_OR_DEPARTMENT_OTHER): Payer: 59 | Admitting: Physician Assistant

## 2021-05-11 NOTE — Progress Notes (Addendum)
Lukes, DAVIAN WOLLENBERG ([id number]) Visit Report for 05/09/2021 Arrival Information Details Patient Name: Date of Service: [place], [name] 05/09/2021 8:00 A M Medical Record Number: [id number] Patient Account Number: [id number] Date of Birth/Sex: Treating RN: 14-Dec-1960 (60 y.o. [name] Primary Care Juliah Scadden: [name] Other Clinician: Haywood Pao Referring Nael Petrosyan: Treating Avalynn Bowe/Extender: Roselind Rily in Treatment: 4 Visit Information History Since Last Visit All ordered tests and consults were completed: Yes Patient Arrived: Wheel Chair Added or deleted any medications: No Arrival Time: 07:50 Any new allergies or adverse reactions: No Accompanied By: spouse Had a fall or experienced change in No Transfer Assistance: EasyPivot Patient Lift activities of daily living that may affect Patient Requires Transmission-Based Precautions: No risk of falls: Patient Has Alerts: Yes Signs or symptoms of abuse/neglect since last visito No Patient Alerts: [place] [facility] 03/2021 Hospitalized since last visit: No ABI: 1.24 Implantable device outside of the clinic excluding No cellular tissue based products placed in the center since last visit: Pain Present Now: No Electronic Signature(s) Signed: 05/10/2021 12:44:31 PM By: Haywood Pao EMT Entered By: Haywood Pao on 05/09/2021 08:10:05 -------------------------------------------------------------------------------- Encounter Discharge Information Details Patient Name: Date of Service: [place], [name] 05/09/2021 8:00 A M Medical Record Number: [id number] Patient Account Number: [id number] Date of Birth/Sex: Treating RN: Oct 10, 1960 (60 y.o. [name] Primary Care Noemie Devivo: [name] Other Clinician: Haywood Pao Referring Jahden Schara: Treating Amen Dargis/Extender: Roselind Rily in Treatment: 4 Encounter Discharge Information  Items Discharge Condition: Stable Ambulatory Status: Wheelchair Discharge Destination: Home Transportation: Private Auto Accompanied By: spouse Schedule Follow-up Appointment: No Clinical Summary of Care: Electronic Signature(s) Signed: 05/10/2021 12:44:31 PM By: Haywood Pao EMT Entered By: Haywood Pao on 05/09/2021 12:11:51 -------------------------------------------------------------------------------- Patient/Caregiver Education Details Patient Name: Date of Service: Lard, [name] 8/29/2022andnbsp8:00 A M Medical Record Number: [id number] Patient Account Number: [id number] Date of Birth/Gender: Treating RN: 08-Oct-1960 (60 y.o. [name] Primary Care Physician: [name] Other Clinician: Haywood Pao Referring Physician: Treating Physician/Extender: Roselind Rily in Treatment: 4 Education Assessment Education Provided To: Patient and Caregiver Education Topics Provided Hyperbaric Oxygenation: Safety: Methods: Demonstration, Explain/Verbal Tissue Oxygenation: Methods: Explain/Verbal Electronic Signature(s) Signed: 05/13/2021 4:18:55 PM By: Haywood Pao EMT Entered By: Haywood Pao on 05/11/2021 10:38:36 -------------------------------------------------------------------------------- Vitals Details Patient Name: Date of Service: Rachal, [name] 05/09/2021 8:00 A M Medical Record Number: [id number] Patient Account Number: [id number] Date of Birth/Sex: Treating RN: 28-Aug-1961 (60 y.o. [name] Primary Care Yassin Scales: [name] Other Clinician: Haywood Pao Referring Cliffie Gingras: Treating Jillana Selph/Extender: Roselind Rily in Treatment: 4 Vital Signs Time Taken: 08:10 Temperature (F): 97.7 Height (in): 62 Pulse (bpm): 67 Weight (lbs): 323 Respiratory Rate (breaths/min): 18 Body Mass Index (BMI): 59.1 Blood Pressure (mmHg): 106/46 Capillary  Blood Glucose (mg/dl): 536 Reference Range: 80 - 120 mg / dl Electronic Signature(s) Signed: 05/10/2021 12:44:31 PM By: Haywood Pao EMT Entered By: Haywood Pao on 05/09/2021 08:14:48

## 2021-05-11 NOTE — Progress Notes (Signed)
[name] [name] [name] ([id number]) Visit Report for 05/09/2021 HBO Details Patient Name: Date of Service: [name] [name] [name] 05/09/2021 8:00 A M Medical Record Number: [id number] Patient Account Number: [id number] Date of Birth/Sex: Treating RN: [birth year] (60 y.o. [name] [name] Primary Care [name] [name]: [name] [name] Other Clinician: Haywood [name] Referring [name] [name]: Treating [name] [name]/Extender: [name] [name] in Treatment: 4 HBO Treatment Course Details Treatment Course Number: 1 Ordering [name] [name]: [name] [name] T Treatments Ordered: otal 40 HBO Treatment Start Date: 05/09/2021 HBO Indication: Diabetic Ulcer(s) of the Lower Extremity HBO Treatment Details Treatment Number: 1 Patient Type: Outpatient Chamber Type: Monoplace Chamber Serial #: [id number] Treatment Protocol: 2.0 ATA with 90 minutes oxygen, and no air breaks Treatment Details Compression Rate Down: 1.0 psi / minute De-Compression Rate Up: 1.5 psi / minute Air breaks and breathing Decompress Decompress Compress Tx Pressure Begins Reached periods Begins Ends (leave unused spaces blank) Chamber Pressure (ATA 1 2 ------2 1 ) Clock Time (24 hr) 09:05 09:20 - - - - - - 10:26 10:35 Treatment Length: 90 (minutes) Treatment Segments: 3 Vital Signs Capillary Blood Glucose Reference Range: 80 - 120 mg / dl HBO Diabetic Blood Glucose Intervention Range: <131 mg/dl or >856 mg/dl Time Vitals Blood Respiratory Capillary Blood Glucose Pulse Action Type: Pulse: Temperature: Taken: Pressure: Rate: Glucose (mg/dl): Meter #: Oximetry (%) Taken: Pre 08:10 106/46 67 18 97.7 106 Post 10:40 143/71 49 18 97.8 101 Treatment Response Treatment Toleration: Fair Treatment Completion Status: Treatment Aborted/Not Restarted Reason: Patient Choice Treatment Notes Patient tolerated treatment fairly well. Patient experienced severe back pain today. At one point she asked if she would get in trouble  if she got out early. I explained to her that she is in control and if she needs to get out that we would get her out of the chamber. I informed her that the more time she could tolerate would be beneficial to her wound(s). She said that she would attempt to finish ( approximately 39 minutes left of treatment). At approximately 1026 (24 minutes left of treatment) she could no longer bear her discomfort and treatment was ended and patient was removed from the hyperbaric chamber at a rate of 1.5 psi/min, monitoring and coaching to avoid ear barotrauma. Additional Procedure Documentation Tissue Sevierity: Fat layer exposed Physician HBO Attestation: I certify that I supervised this HBO treatment in accordance with Medicare guidelines. A trained emergency response team is readily available per Yes hospital policies and procedures. Continue HBOT as ordered. Yes Electronic Signature(s) Signed: 05/09/2021 4:52:04 PM By: [name] Corwin DO Entered By: [name] [name] on 05/09/2021 16:50:15 -------------------------------------------------------------------------------- HBO Safety Checklist Details Patient Name: Date of Service: [name] [name] [name] 05/09/2021 8:00 A M Medical Record Number: [id number] Patient Account Number: [id number] Date of Birth/Sex: Treating RN: 06/26/1961 (60 y.o. [name] [name] Primary Care Koby Pickup: [name] [name] Other Clinician: Haywood [name] Referring Isaish Alemu: Treating Yesika Rispoli/Extender: [name] [name] in Treatment: 4 HBO Safety Checklist Items Safety Checklist Consent Form Signed Patient voided / foley secured and emptied When did you last eato 0400 Last dose of injectable or oral agent 0400 Ostomy pouch emptied and vented if applicable NA All implantable devices assessed, documented and approved NA Intravenous access site secured and place NA Valuables secured Linens and cotton and cotton/polyester blend (less than  51% polyester) Personal oil-based products / skin lotions / body lotions removed Wigs or hairpieces removed NA Smoking or tobacco materials removed NA Books / newspapers / magazines / loose paper removed Cologne, [place],  perfume and deodorant removed Jewelry removed (may wrap wedding band) Make-up removed Hair care products removed Battery operated devices (external) removed Heating patches and chemical warmers removed Titanium eyewear removed Nail polish cured greater than 10 hours NA Casting material cured greater than 10 hours NA Hearing aids removed NA Loose dentures or partials removed NA Prosthetics have been removed NA Patient demonstrates correct use of air break device (if applicable) Patient concerns have been addressed Patient grounding bracelet on and cord attached to chamber Specifics for Inpatients (complete in addition to above) Medication sheet sent with patient NA Intravenous medications needed or due during therapy sent with patient NA Drainage tubes (e.g. nasogastric tube or chest tube secured and vented) NA Endotracheal or Tracheotomy tube secured NA Cuff deflated of air and inflated with saline NA Airway suctioned NA Electronic Signature(s) Signed: 05/10/2021 12:44:31 PM By: [name] [name] EMT Entered By: [name] [name] on 05/09/2021 09:09:25
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
## 2021-05-12 ENCOUNTER — Encounter (HOSPITAL_BASED_OUTPATIENT_CLINIC_OR_DEPARTMENT_OTHER): Payer: 59 | Admitting: Internal Medicine

## 2021-05-12 ENCOUNTER — Other Ambulatory Visit (HOSPITAL_BASED_OUTPATIENT_CLINIC_OR_DEPARTMENT_OTHER): Payer: Self-pay | Admitting: Internal Medicine

## 2021-05-12 ENCOUNTER — Other Ambulatory Visit: Payer: Self-pay

## 2021-05-12 ENCOUNTER — Ambulatory Visit (HOSPITAL_BASED_OUTPATIENT_CLINIC_OR_DEPARTMENT_OTHER)
Admission: RE | Admit: 2021-05-12 | Discharge: 2021-05-12 | Disposition: A | Discharge status: Home / Self Care | Payer: 59 | Source: Ambulatory Visit | Attending: Internal Medicine | Admitting: Internal Medicine

## 2021-05-12 ENCOUNTER — Encounter (HOSPITAL_BASED_OUTPATIENT_CLINIC_OR_DEPARTMENT_OTHER): Payer: 59 | Attending: Internal Medicine | Admitting: Internal Medicine

## 2021-05-12 ENCOUNTER — Other Ambulatory Visit (HOSPITAL_BASED_OUTPATIENT_CLINIC_OR_DEPARTMENT_OTHER): Payer: Self-pay

## 2021-05-12 DIAGNOSIS — L97512 Non-pressure chronic ulcer of other part of right foot with fat layer exposed: Secondary | ICD-10-CM

## 2021-05-12 LAB — GLUCOSE, CAPILLARY: Glucose-Capillary: 166 mg/dL — ABNORMAL HIGH (ref 70–99)

## 2021-05-12 NOTE — Progress Notes (Signed)
Alwin, ALLEXA ACOFF ([id number]) Visit Report for 05/12/2021 Arrival Information Details Patient Name: Date of Service: [place], [name] 05/12/2021 10:15 A M Medical Record Number: [id number] Patient Account Number: [id number] Date of Birth/Sex: Treating RN: December 16, 1960 (60 y.o. [name] Primary Care Read Bonelli: [name] Other Clinician: Referring Harlen Danford: Treating Loudon Krakow/Extender: Montel Culver in Treatment: 5 Visit Information History Since Last Visit Added or deleted any medications: No Patient Arrived: Wheel Chair Any new allergies or adverse reactions: No Arrival Time: 09:00 Had a fall or experienced change in No Accompanied By: husband activities of daily living that may affect Transfer Assistance: Manual risk of falls: Patient Identification Verified: Yes Signs or symptoms of abuse/neglect since last visito No Secondary Verification Process Completed: Yes Hospitalized since last visit: No Patient Requires Transmission-Based Precautions: No Implantable device outside of the clinic excluding No Patient Has Alerts: Yes cellular tissue based products placed in the center Patient Alerts: [place] [facility] 03/2021 since last visit: ABI: 1.24 Has Dressing in Place as Prescribed: Yes Has Compression in Place as Prescribed: Yes Pain Present Now: No Electronic Signature(s) Signed: 05/12/2021 4:37:51 PM By: Lorrin Jackson Entered By: Lorrin Jackson on 05/12/2021 09:01:23 -------------------------------------------------------------------------------- Compression Therapy Details Patient Name: Date of Service: [place], [name] 05/12/2021 10:15 A M Medical Record Number: [id number] Patient Account Number: [id number] Date of Birth/Sex: Treating RN: 10/21/1960 (60 y.o. [name] Primary Care Terez Freimark: [name] Other Clinician: Referring Fynn Adel: Treating Candelario Steppe/Extender: Montel Culver in  Treatment: 5 Compression Therapy Performed for Wound Assessment: Wound #8 Right,Plantar Foot Performed By: Clinician Lorrin Jackson, RN Compression Type: Double Layer Post Procedure Diagnosis Same as Pre-procedure Electronic Signature(s) Signed: 05/12/2021 4:37:51 PM By: Lorrin Jackson Entered By: Lorrin Jackson on 05/12/2021 10:19:22 -------------------------------------------------------------------------------- Encounter Discharge Information Details Patient Name: Date of Service: [place], [name] 05/12/2021 10:15 A M Medical Record Number: [id number] Patient Account Number: [id number] Date of Birth/Sex: Treating RN: 1961-07-11 (60 y.o. [name] Primary Care Jermine Bibbee: [name] Other Clinician: Referring Krislynn Gronau: Treating Glada Wickstrom/Extender: Montel Culver in Treatment: 5 Encounter Discharge Information Items Post Procedure Vitals Discharge Condition: Stable Temperature (F): 98.4 Ambulatory Status: Wheelchair Pulse (bpm): 97 Discharge Destination: Home Respiratory Rate (breaths/min): 22 Transportation: Private Auto Blood Pressure (mmHg): 125/78 Accompanied By: husband Schedule Follow-up Appointment: Yes Clinical Summary of Care: Provided on 05/12/2021 Form Type Recipient Paper Patient Patient Electronic Signature(s) Signed: 05/12/2021 4:37:51 PM By: Lorrin Jackson Entered By: Lorrin Jackson on 05/12/2021 11:55:42 -------------------------------------------------------------------------------- Lower Extremity Assessment Details Patient Name: Date of Service: Junious, [name] 05/12/2021 10:15 A M Medical Record Number: [id number] Patient Account Number: [id number] Date of Birth/Sex: Treating RN: Dec 19, 1960 (59 y.o. [name] Primary Care Swan Zayed: [name] Other Clinician: Referring Kyren Vaux: Treating Javiel Canepa/Extender: Montel Culver in Treatment: 5 Edema Assessment Assessed:  [Left: No] [Right: Yes] Edema: [Left: Ye] [Right: s] Calf Left: Right: Point of Measurement: 36 cm From Medial Instep 41 cm Ankle Left: Right: Point of Measurement: 10 cm From Medial Instep 24 cm Vascular Assessment Pulses: Dorsalis Pedis Palpable: [Right:Yes] Electronic Signature(s) Signed: 05/12/2021 4:37:51 PM By: Lorrin Jackson Entered By: Lorrin Jackson on 05/12/2021 09:06:51 -------------------------------------------------------------------------------- Multi Wound Chart Details Patient Name: Date of Service: Difrancesco, [name] 05/12/2021 10:15 A M Medical Record Number: [id number] Patient Account Number: [id number] Date of Birth/Sex: Treating RN: [birth year] (60 y.o. 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 Primary Etiology: Extremity Cataracts, Lymphedema, Asthma, Cataracts, Lymphedema, Asthma, Cataracts, Lymphedema, Asthma, Comorbid History: Congestive Heart Failure, Coronary Congestive Heart Failure, Coronary Congestive Heart Failure, Coronary Artery Disease, Hypertension, Type II Artery Disease, Hypertension, Type II Artery Disease, Hypertension, Type II Diabetes, Rheumatoid Arthritis, Diabetes, Rheumatoid Arthritis, Diabetes, Rheumatoid Arthritis, Neuropathy Neuropathy Neuropathy 01/18/2021 04/27/2021 04/28/2021 Date Acquired: 3 2 1  Weeks of Treatment: Open Healed - Epithelialized Open Wound Status: 4x3x0.1 0x0x0  2.2x2x0.1 Measurements L x W x D (cm) 9.425 0 3.456 A (cm) : rea 0.942 0 0.346 Volume (cm) : -823.10% 100.00% -29.40% % Reduction in Area: -361.80% 100.00% -29.60% % Reduction in Volume: Full Thickness Without Exposed Grade 2 Full Thickness Without Exposed Classification: Support Structures Support Structures Medium N/A Medium Exudate A mount: Serosanguineous N/A Serosanguineous Exudate Type: red, brown N/A red, brown Exudate Color: Distinct, outline attached N/A Distinct, outline attached Wound Margin: Large (67-100%) N/A Small (1-33%) Granulation Amount: Red, Pink N/A Red Granulation Quality: None Present (0%) N/A Large (67-100%) Necrotic Amount: N/A N/A Eschar, Adherent Slough Necrotic Tissue: Fat Layer (Subcutaneous Tissue): Yes N/A Fat Layer (Subcutaneous Tissue): Yes Exposed Structures: Fascia: No Fascia: No Tendon: No Tendon: No Muscle: No Muscle: No Joint: No Joint: No Bone: No Bone: No None N/A None Epithelialization: N/A N/A Chemical/Enzymatic/Mechanical - Debridement: Selective/Open Wound N/A N/A 10:09 Pre-procedure Verification/Time Out Taken: N/A N/A Slough Tissue Debrided: N/A N/A N/A Instrument: N/A N/A None Bleeding: N/A N/A Procedure was tolerated well Debridement Treatment Response: N/A N/A 2.2x2x0.1 Post Debridement Measurements L x W x D (cm) N/A N/A 0.346 Post Debridement Volume: (cm) N/A N/A N/A Assessment Notes: N/A N/A Debridement Procedures Performed: Wound Number: 8 9 N/A Photos: N/A Right, Plantar Foot Right Amputation Site - Toe N/A Wound Location: Gradually Appeared Surgical Injury N/A Wounding Event: Diabetic Wound/Ulcer of the Lower Open Surgical Wound N/A Primary Etiology: Extremity Cataracts, Lymphedema, Asthma, Cataracts, Lymphedema, Asthma, N/A Comorbid History: Congestive Heart Failure, CoronaryCongestive Heart Failure, Coronary Artery Disease, Hypertension, Type II Artery Disease, Hypertension,  Type II Diabetes, Rheumatoid Arthritis, Diabetes, Rheumatoid Arthritis, Neuropathy Neuropathy 11/09/2020 01/30/2020 N/A Date Acquired: 5 5 N/A Weeks of Treatment: Open Healed - Epithelialized N/A Wound Status: 1.8x1.5x0.2 0x0x0 N/A Measurements L x W x D (cm) 2.121 0 N/A A (cm) : rea 0.424 0 N/A Volume (cm) : -50.00% 100.00% N/A % Reduction in A rea: -49.80% 100.00% N/A % Reduction in Volume: Grade 3 Full Thickness Without Exposed N/A Classification: Support Structures Medium N/A N/A Exudate A mount: Serosanguineous N/A N/A Exudate Type: red, brown N/A N/A Exudate Color: Well defined, not attached N/A N/A Wound Margin: Large (67-100%) N/A N/A Granulation A mount: Pink N/A N/A Granulation Quality: None Present (0%) N/A N/A Necrotic A mount: N/A N/A N/A Necrotic Tissue: Fat Layer (Subcutaneous Tissue): Yes N/A N/A Exposed Structures: Fascia: No Tendon: No Muscle: No Joint: No Bone: No Small (1-33%) N/A N/A Epithelialization: N/A N/A N/A Debridement: N/A N/A N/A Tissue Debrided: N/A N/A N/A Instrument: N/A N/A N/A Bleeding: Debridement Treatment Response: N/A N/A N/A Post Debridement Measurements L x N/A N/A N/A W x D (cm) N/A N/A N/A Post Debridement Volume: (cm) Maceration noted N/A N/A Assessment Notes: Compression Therapy N/A N/A Procedures Performed: Treatment  Notes Electronic Signature(s) Signed: 05/12/2021 12:11:12 PM By: Kalman Shan DO Signed: 05/12/2021 4:23:50 PM By: Rhae Hammock RN Entered By: Kalman Shan on 05/12/2021 11:43:03 -------------------------------------------------------------------------------- Multi-Disciplinary Care Plan Details Patient Name: Date of Service: Jaye, [name] 05/12/2021 10:15 A M Medical Record Number: [id number] Patient Account Number: [id number] Date of Birth/Sex: Treating RN: 1960-11-18 (60 y.o. [name] Primary Care Karsen Fellows: [name] Other Clinician: Referring  Rilyn Scroggs: Treating Demarr Kluever/Extender: Montel Culver in Treatment: 5 Multidisciplinary Care Plan reviewed with physician Active Inactive HBO Nursing Diagnoses: Potential for barotraumas to ears, sinuses, teeth, and lungs or cerebral gas embolism related to changes in atmospheric pressure inside hyperbaric oxygen chamber Potential for oxygen toxicity seizures related to delivery of 100% oxygen at an increased atmospheric pressure Potential for pulmonary oxygen toxicity related to delivery of 100% oxygen at an increased atmospheric pressure Goals: Patient will tolerate the hyperbaric oxygen therapy treatment Date Initiated: 04/06/2021 Target Resolution Date: 06/01/2021 Goal Status: Active Patient/caregiver will verbalize understanding of HBO goals, rationale, procedures and potential hazards Date Initiated: 04/06/2021 Target Resolution Date: 06/01/2021 Goal Status: Active Interventions: Administer a five (5) minute air break for patient if signs and symptoms of seizure appear and notify the hyperbaric physician Assess and provide for patients comfort related to the hyperbaric environment and equalization of middle ear Assess for signs and symptoms related to adverse events, including but not limited to confinement anxiety, pneumothorax, oxygen toxicity and baurotrauma Notes: 05/04/21: Patient begin HBO 05/09/21. Nutrition Nursing Diagnoses: Impaired glucose control: actual or potential Potential for alteratiion in Nutrition/Potential for imbalanced nutrition Goals: Patient/caregiver will maintain therapeutic glucose control Date Initiated: 04/06/2021 Target Resolution Date: 06/01/2021 Goal Status: Active Interventions: Assess HgA1c results as ordered upon admission and as needed Assess patient nutrition upon admission and as needed per policy Provide education on elevated blood sugars and impact on wound healing Treatment Activities: Patient referred to  Primary Care Physician for further nutritional evaluation : 04/06/2021 Notes: 05/04/21: Glucose control ongoing. Wound/Skin Impairment Nursing Diagnoses: Impaired tissue integrity Knowledge deficit related to ulceration/compromised skin integrity Goals: Patient/caregiver will verbalize understanding of skin care regimen Date Initiated: 04/06/2021 Date Inactivated: 05/04/2021 Target Resolution Date: 05/04/2021 Goal Status: Met Ulcer/skin breakdown will have a volume reduction of 30% by week 4 Date Initiated: 04/06/2021 Target Resolution Date: 05/04/2021 Goal Status: Active Interventions: Assess patient/caregiver ability to obtain necessary supplies Assess patient/caregiver ability to perform ulcer/skin care regimen upon admission and as needed Assess ulceration(s) every visit Provide education on ulcer and skin care Treatment Activities: Skin care regimen initiated : 04/06/2021 Topical wound management initiated : 04/06/2021 Notes: Electronic Signature(s) Signed: 05/12/2021 4:37:51 PM By: Lorrin Jackson Entered By: Lorrin Jackson on 05/12/2021 09:24:00 -------------------------------------------------------------------------------- Pain Assessment Details Patient Name: Date of Service: [place], [name] 05/12/2021 10:15 A M Medical Record Number: [id number] Patient Account Number: [id number] Date of Birth/Sex: Treating RN: 09-21-1960 (60 y.o. [name] Primary Care Traveion Ruddock: [name] Other Clinician: Referring Jerrico Covello: Treating Katlynn Naser/Extender: Montel Culver in Treatment: 5 Active Problems Location of Pain Severity and Description of Pain Patient Has Paino No Site Locations Pain Management and Medication Current Pain Management: Electronic Signature(s) Signed: 05/12/2021 4:37:51 PM By: Lorrin Jackson Entered By: Lorrin Jackson on 05/12/2021  09:04:05 -------------------------------------------------------------------------------- Patient/Caregiver Education Details Patient Name: Date of Service: Peralta, [name] 9/1/2022andnbsp10:15 A M Medical Record Number: [id number] Patient Account Number: [id number] Date of Birth/Gender: Treating RN: 1960-12-23 (60 y.o. [name] Primary Care Physician: [name] Other Clinician: Referring  Physician: Treating Physician/Extender: Montel Culver in Treatment: 5 Education Assessment Education Provided To: Patient Education Topics Provided Elevated Blood Sugar/ Impact on Healing: Methods: Explain/Verbal, Printed Responses: State content correctly Venous: Methods: Explain/Verbal, Printed Responses: State content correctly Wound/Skin Impairment: Methods: Explain/Verbal, Printed Responses: State content correctly Electronic Signature(s) Signed: 05/12/2021 4:37:51 PM By: Lorrin Jackson Entered By: Lorrin Jackson on 05/12/2021 09:24:38 -------------------------------------------------------------------------------- Wound Assessment Details Patient Name: Date of Service: Dutkiewicz, [name] 05/12/2021 10:15 A M Medical Record Number: [id number] Patient Account Number: [id number] Date of Birth/Sex: Treating RN: 1961-06-20 (60 y.o. [name] Primary Care Camani Sesay: [name] Other Clinician: Referring Nghia Mcentee: Treating [name] Gallaga/Extender: Montel Culver in Treatment: 5 Wound Status Wound Number: 10 Primary Lesion Etiology: Wound Location: Left Perineum Wound Open Wounding Event: Other Lesion Status: Date Acquired: 01/18/2021 Comorbid Cataracts, Lymphedema, Asthma, Congestive Heart Failure, Weeks Of Treatment: 3 History: Coronary Artery Disease, Hypertension, Type II Diabetes, Clustered Wound: No Rheumatoid Arthritis, Neuropathy Photos Wound Measurements Length: (cm) 4 Width: (cm)  3 Depth: (cm) 0.1 Area: (cm) 9.425 Volume: (cm) 0.942 % Reduction in Area: -823.1% % Reduction in Volume: -361.8% Epithelialization: None Tunneling: No Undermining: No Wound Description Classification: Full Thickness Without Exposed Support Structures Wound Margin: Distinct, outline attached Exudate Amount: Medium Exudate Type: Serosanguineous Exudate Color: red, brown Foul Odor After Cleansing: No Slough/Fibrino No Wound Bed Granulation Amount: Large (67-100%) Exposed Structure Granulation Quality: Red, Pink Fascia Exposed: No Necrotic Amount: None Present (0%) Fat Layer (Subcutaneous Tissue) Exposed: Yes Tendon Exposed: No Muscle Exposed: No Joint Exposed: No Bone Exposed: No Treatment Notes Wound #10 (Perineum) Wound Laterality: Left Cleanser Soap and Water Discharge Instruction: May shower and wash wound with dial antibacterial soap and water prior to dressing change. Wound Cleanser Discharge Instruction: Cleanse the wound with wound cleanser prior to applying a clean dressing using gauze sponges, not tissue or cotton balls. Peri-Wound Care Zinc Oxide Ointment 30g tube Discharge Instruction: Apply to wound and periwound Topical Primary Dressing Secondary Dressing Woven Gauze Sponges 2x2 in Discharge Instruction: Apply over primary dressing as directed. Secured With Compression Wrap Compression Stockings Environmental education officer) Signed: 05/12/2021 4:37:51 PM By: Lorrin Jackson Entered By: Lorrin Jackson on 05/12/2021 09:20:41 -------------------------------------------------------------------------------- Wound Assessment Details Patient Name: Date of Service: Kruczek, [name] 05/12/2021 10:15 A M Medical Record Number: [id number] Patient Account Number: [id number] Date of Birth/Sex: Treating RN: October 22, 1960 (60 y.o. [name] Primary Care Eunie Lawn: [name] Other Clinician: Referring Leinaala Catanese: Treating Unika Nazareno/Extender: Montel Culver in Treatment: 5 Wound Status Wound Number: 11 Primary Diabetic Wound/Ulcer of the Lower Extremity Etiology: Wound Location: Right, Dorsal Foot Wound Healed - Epithelialized Wounding Event: Gradually Appeared Status: Date Acquired: 04/27/2021 Comorbid Cataracts, Lymphedema, Asthma, Congestive Heart Failure, Weeks Of Treatment: 2 History: Coronary Artery Disease, Hypertension, Type II Diabetes, Clustered Wound: No Rheumatoid Arthritis, Neuropathy Photos Wound Measurements Length: (cm) Width: (cm) Depth: (cm) Area: (cm) Volume: (cm) 0 % Reduction in Area: 100% 0 % Reduction in Volume: 100% 0 0 0 Wound Description Classification: Grade 2 Electronic Signature(s) Signed: 05/12/2021 4:37:51 PM By: Lorrin Jackson Entered By: Lorrin Jackson on 05/12/2021 09:14:47 -------------------------------------------------------------------------------- Wound Assessment Details Patient Name: Date of Service: Devilla, [name] 05/12/2021 10:15 A M Medical Record Number: [id number] Patient Account Number: [id number] Date of Birth/Sex: Treating RN: 11/20/1960 (60 y.o. [name] Primary Care Nemesis Rainwater: [name] Other Clinician: Referring Ardeth Repetto: Treating Berneda Piccininni/Extender: Montel Culver in Treatment: 5 Wound Status Wound Number: 34  Primary 2nd degree Burn Etiology: Wound Location: Left, Anterior Breast Wound Open Wounding Event: Blister Status: Date Acquired: 04/28/2021 Comorbid Cataracts, Lymphedema, Asthma, Congestive Heart Failure, Weeks Of Treatment: 1 History: Coronary Artery Disease, Hypertension, Type II Diabetes, Clustered Wound: No Rheumatoid Arthritis, Neuropathy Photos Wound Measurements Length: (cm) 2.2 Width: (cm) 2 Depth: (cm) 0.1 Area: (cm) 3.456 Volume: (cm) 0.346 Wound Description Classification: Full Thickness Without Exposed Support Struct Wound Margin: Distinct, outline  attached Exudate Amount: Medium Exudate Type: Serosanguineous Exudate Color: red, brown Foul Odor After Cleansing: Slough/Fibrino % Reduction in Area: -29.4% % Reduction in Volume: -29.6% Epithelialization: None Tunneling: No Undermining: No ures No Yes Wound Bed Granulation Amount: Small (1-33%) Exposed Structure Granulation Quality: Red Fascia Exposed: No Necrotic Amount: Large (67-100%) Fat Layer (Subcutaneous Tissue) Exposed: Yes Necrotic Quality: Eschar, Adherent Slough Tendon Exposed: No Muscle Exposed: No Joint Exposed: No Bone Exposed: No Treatment Notes Wound #12 (Breast) Wound Laterality: Left, Anterior Cleanser Soap and Water Discharge Instruction: May shower and wash wound with dial antibacterial soap and water prior to dressing change. Wound Cleanser Discharge Instruction: Cleanse the wound with wound cleanser prior to applying a clean dressing using gauze sponges, not tissue or cotton balls. Peri-Wound Care Topical Primary Dressing Santyl Ointment Discharge Instruction: Apply nickel thick amount to wound bed as instructed Secondary Dressing Woven Gauze Sponges 2x2 in Discharge Instruction: Cover Santyl with saline moistened 2x2 Zetuvit Plus Silicone Border Dressing 4x4 (in/in) Discharge Instruction: Apply silicone border over primary dressing as directed. Secured With Compression Wrap Compression Stockings Environmental education officer) Signed: 05/12/2021 4:37:51 PM By: Lorrin Jackson Entered By: Lorrin Jackson on 05/12/2021 09:17:17 -------------------------------------------------------------------------------- Wound Assessment Details Patient Name: Date of Service: Schlick, [name] 05/12/2021 10:15 A M Medical Record Number: [id number] Patient Account Number: [id number] Date of Birth/Sex: Treating RN: November 23, 1960 (60 y.o. [name] Primary Care Laquesha Holcomb: [name] Other Clinician: Referring Shaneika Rossa: Treating  Rosezetta Balderston/Extender: Montel Culver in Treatment: 5 Wound Status Wound Number: 8 Primary Diabetic Wound/Ulcer of the Lower Extremity Etiology: Wound Location: Right, Plantar Foot Wound Open Wounding Event: Gradually Appeared Status: Date Acquired: 11/09/2020 Comorbid Cataracts, Lymphedema, Asthma, Congestive Heart Failure, Weeks Of Treatment: 5 Weeks Of Treatment: 5 History: Coronary Artery Disease, Hypertension, Type II Diabetes, Clustered Wound: No Rheumatoid Arthritis, Neuropathy Photos Wound Measurements Length: (cm) 1.8 Width: (cm) 1.5 Depth: (cm) 0.2 Area: (cm) 2.121 Volume: (cm) 0.424 % Reduction in Area: -50% % Reduction in Volume: -49.8% Epithelialization: Small (1-33%) Tunneling: No Undermining: No Wound Description Classification: Grade 3 Wound Margin: Well defined, not attached Exudate Amount: Medium Exudate Type: Serosanguineous Exudate Color: red, brown Foul Odor After Cleansing: No Slough/Fibrino No Wound Bed Granulation Amount: Large (67-100%) Exposed Structure Granulation Quality: Pink Fascia Exposed: No Necrotic Amount: None Present (0%) Fat Layer (Subcutaneous Tissue) Exposed: Yes Tendon Exposed: No Muscle Exposed: No Joint Exposed: No Bone Exposed: No Assessment Notes Maceration noted Treatment Notes Wound #8 (Foot) Wound Laterality: Plantar, Right Cleanser Soap and Water Discharge Instruction: May shower and wash wound with dial antibacterial soap and water prior to dressing change. Wound Cleanser Discharge Instruction: Cleanse the wound with wound cleanser prior to applying a clean dressing using gauze sponges, not tissue or cotton balls. Peri-Wound Care Sween Lotion (Moisturizing lotion) Discharge Instruction: Apply moisturizing lotion as directed Topical Primary Dressing KerraCel Ag Gelling Fiber Dressing, 2x2 in (silver alginate) Discharge Instruction: Apply silver alginate. Secondary Dressing Woven Gauze  Sponge, Non-Sterile 4x4 in Discharge Instruction: Apply over primary dressing as directed. ABD Pad, 5x9  Discharge Instruction: Apply over primary dressing as directed. Optifoam Non-Adhesive Dressing, 4x4 in Discharge Instruction: cut to form donut Secured With Compression Wrap Unnaboot w/Calamine, 4x10 (in/yd) Discharge Instruction: Apply layer at top to anchor wrap CoFlex TLC XL 2-layer Compression System 4x7 (in/yd) Discharge Instruction: Apply CoFlex 2-layer compression as directed. (alt for 4 layer) Compression Stockings Add-Ons Electronic Signature(s) Signed: 05/12/2021 4:37:51 PM By: Lorrin Jackson Entered By: Lorrin Jackson on 05/12/2021 09:13:03 -------------------------------------------------------------------------------- Wound Assessment Details Patient Name: Date of Service: Beasley, [name] 05/12/2021 10:15 A M Medical Record Number: [id number] Patient Account Number: [id number] Date of Birth/Sex: Treating RN: 04/07/1961 (60 y.o. [name] Primary Care Kamaree Berkel: [name] Other Clinician: Referring Osceola Holian: Treating Lionardo Haze/Extender: Montel Culver in Treatment: 5 Wound Status Wound Number: 9 Primary Open Surgical Wound Etiology: Wound Location: Right Amputation Site - Toe Wound Healed - Epithelialized Wounding Event: Surgical Injury Status: Date Acquired: 01/30/2020 Comorbid Cataracts, Lymphedema, Asthma, Congestive Heart Failure, Weeks Of Treatment: 5 History: Coronary Artery Disease, Hypertension, Type II Diabetes, Clustered Wound: No Rheumatoid Arthritis, Neuropathy Photos Wound Measurements Length: (cm) Width: (cm) Depth: (cm) Area: (cm) Volume: (cm) 0 % Reduction in Area: 100% 0 % Reduction in Volume: 100% 0 0 0 Wound Description Classification: Full Thickness Without Exposed Support Structur es Electronic Signature(s) Signed: 05/12/2021 4:37:51 PM By: Lorrin Jackson Entered By: Lorrin Jackson on  05/12/2021 09:09:58 -------------------------------------------------------------------------------- Vitals Details Patient Name: Date of Service: Lusty, [name] 05/12/2021 10:15 A M Medical Record Number: [id number] Patient Account Number: [id number] Date of Birth/Sex: Treating RN: [birth year] (60 y.o. [name] Primary Care Terrie Haring: [name] Other Clinician: Referring Dhruv Christina: Treating Shukri Nistler/Extender: Montel Culver in Treatment: 5 Vital Signs Time Taken: 09:01 Temperature (F): 98.4 Height (in): 62 Pulse (bpm): 97 Weight (lbs): 323 Respiratory Rate (breaths/min): 22 Body Mass Index (BMI): 59.1 Blood Pressure (mmHg): 125/78 Capillary Blood Glucose (mg/dl): 164 Reference Range: 80 - 120 mg / dl Electronic Signature(s) Signed: 05/12/2021 4:37:51 PM By: Lorrin Jackson Entered By: Lorrin Jackson on 05/12/2021 09:03:42
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
## 2021-05-13 ENCOUNTER — Other Ambulatory Visit: Payer: Self-pay | Admitting: Family Medicine

## 2021-05-13 ENCOUNTER — Encounter (HOSPITAL_BASED_OUTPATIENT_CLINIC_OR_DEPARTMENT_OTHER): Payer: 59 | Admitting: Internal Medicine

## 2021-05-13 MED ORDER — PREDNISONE 20 MG PO TABS: 40.0000 mg | ORAL_TABLET | Freq: Every day | ORAL | 0 refills | Status: DC

## 2021-05-13 NOTE — Progress Notes (Signed)
[name], [name] ([id number]) Visit Report for 05/12/2021 Arrival Information Details Patient Name: Date of Service: [name], [name] 05/12/2021 8:00 A M Medical Record Number: [id number] Patient Account Number: [id number] Date of Birth/Sex: Treating RN: [birth year] (60 y.o. [name] Primary Care [name]: [name] Other Clinician: Haywood [name] Referring [name]: Treating [name]/Extender: [name] in [name]: 5 Visit Information History Since Last Visit All ordered tests and consults were completed: Yes Patient Arrived: Wheel Chair Added or deleted any medications: No Arrival Time: 07:45 Any new allergies or adverse reactions: No Accompanied By: spouse Had a fall or experienced change in No Transfer Assistance: EasyPivot Patient Lift activities of daily living that may affect Patient Identification Verified: Yes risk of falls: Secondary Verification Process Completed: Yes Signs or symptoms of abuse/neglect since last visito No Patient Requires Transmission-Based Precautions: No Hospitalized since last visit: No Patient Has Alerts: Yes Implantable device outside of the clinic excluding No Patient Alerts: [place] [facility] 03/2021 cellular tissue based products placed in the center ABI: 1.24 since last visit: Pain Present Now: Yes Electronic Signature(s) Signed: 05/13/2021 4:26:36 PM By: [name] EMT Entered By: [name] on 05/12/2021 08:34:14 -------------------------------------------------------------------------------- Encounter Discharge Information Details Patient Name: Date of Service: [name], [name] 05/12/2021 8:00 A M Medical Record Number: [id number] Patient Account Number: [id number] Date of Birth/Sex: Treating RN: 1960/09/27 (60 y.o. [name] Primary Care [name]: Treating [name]/Extender: [name] in [name]: 5 Encounter Discharge Information Items Discharge Condition: Stable Ambulatory Status: Wheelchair Discharge Destination: Other (Note Required) Transportation: Other Accompanied By: spouse Schedule Follow-up Appointment: No Clinical Summary of Care: Notes Patient had a wound care appointment after [name]. Electronic Signature(s) Signed: 05/12/2021 12:24:26 PM By: [name] EMT Entered By: [name] on 05/12/2021 12:24:25 -------------------------------------------------------------------------------- Vitals Details Patient Name: Date of Service: [name], [name] 05/12/2021 8:00 A M Medical Record Number: [id number] Patient Account Number: [id number] Date of Birth/Sex: Treating RN: [birth year] (60 y.o. [name] Primary Care [name]: Treating [name]: 5 Vital Signs Time Taken: 07:58 Temperature (F): 98.1 Height (in): 62 Pulse (bpm): 93 Weight (lbs): 323 Respiratory Rate (breaths/min): 20 Body Mass Index (BMI): 59.1 Blood Pressure (mmHg): 124/61 Capillary Blood Glucose (mg/dl): 332 Reference Range: 80 - 120 mg / dl Airway Pulse Oximetry (%): 98 Electronic Signature(s) Signed: 05/13/2021 4:26:36 PM By: [name] EMT Entered By: [name] on 05/12/2021 10:36:34

## 2021-05-13 NOTE — Progress Notes (Signed)
[name], [name] ([id number]) Visit Report for 05/12/2021 HBO Details Patient Name: Date of Service: [place], DA [name]. 05/12/2021 8:00 A [name] Medical Record Number: [id number] Patient Account Number: [id number] Date of Birth/Sex: Treating RN: 07-09-1961 (60 y.o. [name], [name] Primary Care Pranika Finks: [name] Other Clinician: Haywood Pao Referring Mareesa Gathright: Treating Alessander Sikorski/Extender: Lynnell Catalan in Treatment: 5 HBO Treatment Course Details Treatment Course Number: 1 Ordering Laynie Espy: Geralyn Corwin T Treatments Ordered: otal 40 HBO Treatment Start Date: 05/09/2021 HBO Indication: Diabetic Ulcer(s) of the Lower Extremity HBO Treatment Details Treatment Number: Treatment Not Started: Harlean Regula Choice Patient Type: Outpatient Chamber Type: Monoplace Chamber Serial #: [id number] Treatment Protocol: 2.0 ATA with 90 minutes oxygen, and no air breaks Treatment Details Air breaks and breathing Decompress Decompress Compress Tx Pressure Begins Reached periods Begins Ends (leave unused spaces blank) Chamber Pressure (ATA 1 2 ------2 1 ) Clock Time (24 hr) - - ------- - Treatment Length: (minutes) Treatment Segments: Vital Signs Capillary Blood Glucose Reference Range: 80 - 120 mg / dl HBO Diabetic Blood Glucose Intervention Range: <131 mg/dl or >314 mg/dl Time Vitals Blood Respiratory Capillary Blood Glucose Pulse Action Type: Pulse: Temperature: Taken: Pressure: Rate: Glucose (mg/dl): Meter #: Oximetry (%) Taken: Pre 07:58 124/61 93 20 98.1 166 98 Treatment Response Treatment Completion Status: Treatment Not Started Reason: [name] Choice Additional Procedure Documentation Tissue Sevierity: Fat layer exposed Justa Hatchell Notes Patient complained of wheezing and shortness of breath she took a nebulizer at home today. Physical exam suggested inspiratory wheezing but no gross respiratory distress. O2 sat was 96% on room air vital signs  were stable. We did not think she could undergo treatment today and the patient agreed Physician HBO Attestation: I certify that I supervised this HBO treatment in accordance with Medicare guidelines. A trained emergency response team is readily available per Yes hospital policies and procedures. Continue HBOT as ordered. Yes Electronic Signature(s) Signed: 05/12/2021 5:31:22 PM By: Baltazar Najjar MD Entered By: Baltazar Najjar on 05/12/2021 17:08:12 -------------------------------------------------------------------------------- HBO Safety Checklist Details Patient Name: Date of Service: [name] 05/12/2021 8:00 A [name] Medical Record Number: [id number] Patient Account Number: [id number] Date of Birth/Sex: Treating RN: 07-May-1961 (60 y.o. [name], [name] Primary Care Kirston Luty: [name] Other Clinician: Haywood Pao Referring Amaiya Scruton: Treating Mavis Gravelle/Extender: Lynnell Catalan in Treatment: 5 HBO Safety Checklist Items Safety Checklist Consent Form Signed Patient voided / foley secured and emptied When did you last eato 0600 Last dose of injectable or oral agent 0530 metformin and insulin Ostomy pouch emptied and vented if applicable NA All implantable devices assessed, documented and approved NA Intravenous access site secured and place NA Valuables secured Linens and cotton and cotton/polyester blend (less than 51% polyester) Personal oil-based products / skin lotions / body lotions removed Wigs or hairpieces removed NA Smoking or tobacco materials removed NA Books / newspapers / magazines / loose paper removed Cologne, aftershave, perfume and deodorant removed Jewelry removed (may wrap wedding band) Make-up removed Hair care products removed Battery operated devices (external) removed Heating patches and chemical warmers removed Titanium eyewear removed NA Nail polish cured greater than 10 hours NA Casting material  cured greater than 10 hours NA Hearing aids removed NA Loose dentures or partials removed NA Prosthetics have been removed NA Patient demonstrates correct use of air break device (if applicable) Patient concerns have been addressed Patient grounding bracelet on and cord attached to chamber Specifics for Inpatients (complete in addition to above) Medication sheet sent with  patient NA Intravenous medications needed or due during therapy sent with patient NA Drainage tubes (e.g. nasogastric tube or chest tube secured and vented) NA Endotracheal or Tracheotomy tube secured NA Cuff deflated of air and inflated with saline NA Airway suctioned NA Notes Paper version used today. Electronic Signature(s) Signed: 05/13/2021 4:26:36 PM By: Haywood Pao EMT Entered By: Haywood Pao on 05/12/2021 10:40:18
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
## 2021-05-17 ENCOUNTER — Encounter (HOSPITAL_BASED_OUTPATIENT_CLINIC_OR_DEPARTMENT_OTHER): Payer: 59 | Admitting: Internal Medicine

## 2021-05-18 ENCOUNTER — Other Ambulatory Visit: Payer: Self-pay

## 2021-05-18 ENCOUNTER — Telehealth (INDEPENDENT_AMBULATORY_CARE_PROVIDER_SITE_OTHER): Payer: 59 | Admitting: Family Medicine

## 2021-05-18 ENCOUNTER — Telehealth: Payer: Self-pay | Admitting: Family Medicine

## 2021-05-18 ENCOUNTER — Encounter: Payer: Self-pay | Admitting: Family Medicine

## 2021-05-18 DIAGNOSIS — K219 Gastro-esophageal reflux disease without esophagitis: Secondary | ICD-10-CM

## 2021-05-18 DIAGNOSIS — E1169 Type 2 diabetes mellitus with other specified complication: Secondary | ICD-10-CM

## 2021-05-18 DIAGNOSIS — M545 Low back pain, unspecified: Secondary | ICD-10-CM

## 2021-05-18 DIAGNOSIS — K929 Disease of digestive system, unspecified: Secondary | ICD-10-CM

## 2021-05-18 DIAGNOSIS — Z79899 Other long term (current) drug therapy: Secondary | ICD-10-CM

## 2021-05-18 DIAGNOSIS — E669 Obesity, unspecified: Secondary | ICD-10-CM

## 2021-05-18 DIAGNOSIS — L608 Other nail disorders: Secondary | ICD-10-CM

## 2021-05-18 MED ORDER — CYCLOBENZAPRINE HCL 10 MG PO TABS: 5.0000 mg | ORAL_TABLET | Freq: Three times a day (TID) | ORAL | 0 refills | Status: DC | PRN

## 2021-05-18 NOTE — Progress Notes (Signed)
Musculoskeletal Exam  Patient: [name] DOB: [birth year]  DOS: 05/18/2021  SUBJECTIVE:  Chief Complaint:   Chief Complaint  Patient presents with   Back Pain    [name] is a 60 y.o.  female for evaluation and treatment of back pain. Due to COVID-19 pandemic, we are interacting via web portal for an electronic face-to-face visit. I verified patient's ID using 2 identifiers. Patient agreed to proceed with visit via this method. Patient is at home, I am at office. Patient, spouse and I are present for visit.   Onset:  4 days ago.  Has been having hyperbaric O2 tx's and laying causes it to flare.  Location: lower, bilateral Character:  aching, dull, and sharp when she moves Progression of issue:  has worsened Associated symptoms: worse when she lays down, radiating down hamstrings Denies redness, bruising, swelling Denies bowel/bladder incontinence or weakness Treatment: to date has been rest and oral steroids.   Neurovascular symptoms: no  Pt sees rheum and her doc saw her fingernail and reported she needs to see her PCP or derm. There is no pain, itching or drainage. No new topicals. She has not tried anything thus far.   Patient has a history of both diabetes and rheumatoid arthritis.  She takes Plaquenil daily.  Due to the pandemic, she has not seen her eye doctor in 2 years.  She needs a referral.  Patient has a history of poor digestion and reflux.  She was referred to a gastroenterologist in the past but could not go due to the pandemic.  She is ready to go at this time.  Past Medical History:  Diagnosis Date   Aortic stenosis    mild AS by echo 02/2021   Asthma    "on daily RX and rescue inhaler" (04/18/2018)   Chronic diastolic CHF (congestive heart failure) (HCC) 09/2015   Chronic lower back pain    Chronic neck pain    Chronic pain syndrome    Fentanyl Patch   Colon polyps    Coronary artery disease    cath with normal LM, 30% LAD, 85% mid RCA and 95%  distal RCA s/p PCI of the mid to distal RCA and now on DAPT with ASA and Ticagrelor.     Eczema    Excessive daytime sleepiness 11/26/2015   Fibromyalgia    Gallstones    GERD (gastroesophageal reflux disease)    Heart murmur    "noted for the 1st time on 04/18/2018"   History of blood transfusion 07/2010   "S/P oophorectomy"   History of gout    History of hiatal hernia 1980s   "gone now" (04/18/2018)   Hyperlipidemia    Hypertension    takes Metoprolol and Enalapril daily   Hypothyroidism    takes Synthroid daily   IBS (irritable bowel syndrome)    Migraine    "nothing in the 2000s" (04/18/2018)   Mixed connective tissue disease (HCC)    NAFLD (nonalcoholic fatty liver disease)    Pneumonia    "several times" (04/18/2018)   PVC's (premature ventricular contractions)    noted on event monitor 02/2021   Rheumatoid arthritis (HCC)    "hands, elbows, shoulders, probably knees" (04/18/2018)   Scoliosis    Spondylosis    Type II diabetes mellitus (HCC)    takes Metformin and Hum R daily (04/18/2018)    Objective: No conversational dyspnea Age appropriate judgment and insight Nml affect and mood  Assessment:  Acute bilateral low back pain  without sciatica - Plan: cyclobenzaprine (FLEXERIL) 10 MG tablet  Digestive disorder - Plan: Ambulatory referral to Gastroenterology  Gastroesophageal reflux disease, unspecified whether esophagitis present - Plan: Ambulatory referral to Gastroenterology  Diabetes mellitus type 2 in obese [facility]) - Plan: Ambulatory referral to Ophthalmology  Long-term use of Plaquenil - Plan: Ambulatory referral to Ophthalmology  Longitudinal melanonychia  Plan: 1. Stretches/exercises, heat, ice, Tylenol, NSAIDs 2/3 Chronic, refer to GI. 4/5. Refer to Ophtho. Chronic. 6. New, uncertain prog.  If part of the nail, this is nonconcerning.  If part of the skin, would recommend biopsy.  She will let me know.  The patient and her spouse voiced understanding and  agreement to the plan.   Jilda Roche [place], DO 05/18/21  12:13 PM

## 2021-05-18 NOTE — Telephone Encounter (Signed)
Carollee Herter from [facility] called regarding recent referral. She stated they no longer take [facility] and wants to know if you have an alternate. She can be contacted at [phone]. Please advise.
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
## 2021-05-19 ENCOUNTER — Encounter (HOSPITAL_BASED_OUTPATIENT_CLINIC_OR_DEPARTMENT_OTHER): Payer: 59 | Admitting: Internal Medicine

## 2021-05-19 DIAGNOSIS — I890 Lymphedema, not elsewhere classified: Secondary | ICD-10-CM | POA: Diagnosis not present

## 2021-05-19 DIAGNOSIS — Z6841 Body Mass Index (BMI) 40.0 and over, adult: Secondary | ICD-10-CM | POA: Diagnosis not present

## 2021-05-19 DIAGNOSIS — L03315 Cellulitis of perineum: Secondary | ICD-10-CM | POA: Diagnosis not present

## 2021-05-19 DIAGNOSIS — L97512 Non-pressure chronic ulcer of other part of right foot with fat layer exposed: Secondary | ICD-10-CM | POA: Diagnosis not present

## 2021-05-19 DIAGNOSIS — T2121XD Burn of second degree of chest wall, subsequent encounter: Secondary | ICD-10-CM

## 2021-05-19 DIAGNOSIS — T8131XA Disruption of external operation (surgical) wound, not elsewhere classified, initial encounter: Secondary | ICD-10-CM | POA: Insufficient documentation

## 2021-05-19 DIAGNOSIS — E669 Obesity, unspecified: Secondary | ICD-10-CM | POA: Insufficient documentation

## 2021-05-19 DIAGNOSIS — L98492 Non-pressure chronic ulcer of skin of other sites with fat layer exposed: Secondary | ICD-10-CM

## 2021-05-19 DIAGNOSIS — I509 Heart failure, unspecified: Secondary | ICD-10-CM | POA: Insufficient documentation

## 2021-05-19 DIAGNOSIS — X58XXXD Exposure to other specified factors, subsequent encounter: Secondary | ICD-10-CM | POA: Insufficient documentation

## 2021-05-19 DIAGNOSIS — Z89421 Acquired absence of other right toe(s): Secondary | ICD-10-CM | POA: Insufficient documentation

## 2021-05-19 DIAGNOSIS — Y839 Surgical procedure, unspecified as the cause of abnormal reaction of the patient, or of later complication, without mention of misadventure at the time of the procedure: Secondary | ICD-10-CM | POA: Diagnosis not present

## 2021-05-19 DIAGNOSIS — I110 Hypertensive heart disease with heart failure: Secondary | ICD-10-CM | POA: Insufficient documentation

## 2021-05-19 DIAGNOSIS — E11621 Type 2 diabetes mellitus with foot ulcer: Secondary | ICD-10-CM | POA: Diagnosis not present

## 2021-05-19 DIAGNOSIS — L97519 Non-pressure chronic ulcer of other part of right foot with unspecified severity: Secondary | ICD-10-CM | POA: Diagnosis present

## 2021-05-19 DIAGNOSIS — I10 Essential (primary) hypertension: Secondary | ICD-10-CM | POA: Diagnosis not present
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
## 2021-05-20 ENCOUNTER — Encounter (HOSPITAL_BASED_OUTPATIENT_CLINIC_OR_DEPARTMENT_OTHER): Payer: 59 | Admitting: Internal Medicine

## 2021-05-23 ENCOUNTER — Encounter (HOSPITAL_BASED_OUTPATIENT_CLINIC_OR_DEPARTMENT_OTHER): Payer: 59 | Admitting: Internal Medicine

## 2021-05-23 ENCOUNTER — Other Ambulatory Visit: Payer: Self-pay

## 2021-05-23 DIAGNOSIS — Z89421 Acquired absence of other right toe(s): Secondary | ICD-10-CM

## 2021-05-23 DIAGNOSIS — E11621 Type 2 diabetes mellitus with foot ulcer: Secondary | ICD-10-CM | POA: Diagnosis not present

## 2021-05-23 DIAGNOSIS — T8131XA Disruption of external operation (surgical) wound, not elsewhere classified, initial encounter: Secondary | ICD-10-CM

## 2021-05-23 DIAGNOSIS — L97512 Non-pressure chronic ulcer of other part of right foot with fat layer exposed: Secondary | ICD-10-CM

## 2021-05-23 LAB — GLUCOSE, CAPILLARY
Glucose-Capillary: 212 mg/dL — ABNORMAL HIGH (ref 70–99)
Glucose-Capillary: 203 mg/dL — ABNORMAL HIGH (ref 70–99)

## 2021-05-24 ENCOUNTER — Encounter (HOSPITAL_BASED_OUTPATIENT_CLINIC_OR_DEPARTMENT_OTHER): Payer: 59 | Admitting: Internal Medicine

## 2021-05-24 LAB — GLUCOSE, CAPILLARY
Glucose-Capillary: 105 mg/dL — ABNORMAL HIGH (ref 70–99)
Glucose-Capillary: 114 mg/dL — ABNORMAL HIGH (ref 70–99)
Glucose-Capillary: 111 mg/dL — ABNORMAL HIGH (ref 70–99)
Glucose-Capillary: 135 mg/dL — ABNORMAL HIGH (ref 70–99)
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
## 2021-05-25 NOTE — Progress Notes (Signed)
[name], [name] ([id number]) Visit Report for 05/23/2021 HBO Details Patient Name: Date of Service: [name], [name] 05/23/2021 8:00 A M Medical Record Number: [id number] Patient Account Number: [id number] Date of Birth/Sex: Treating RN: 11-26-1960 (60 y.o. [name] Primary Care Treonna Klee: [name] Other Clinician: Haywood Pao Referring Ata Pecha: Treating Yishai Rehfeld/Extender: Roselind Rily in Treatment: 6 HBO Treatment Course Details Treatment Course Number: 1 Ordering Leith Hedlund: Geralyn Corwin T Treatments Ordered: otal 40 HBO Treatment Start Date: 05/09/2021 HBO Indication: Diabetic Ulcer(s) of the Lower Extremity HBO Treatment Details Treatment Number: 3 Patient Type: Outpatient Chamber Type: Monoplace Chamber Serial #: [id number] Treatment Protocol: 2.0 ATA with 90 minutes oxygen, and no air breaks Treatment Details Compression Rate Down: 2.0 psi / minute De-Compression Rate Up: 2.0 psi / minute Air breaks and breathing Decompress Decompress Compress Tx Pressure Begins Reached periods Begins Ends (leave unused spaces blank) Chamber Pressure (ATA 1 2 ------2 1 ) Clock Time (24 hr) 08:23 08:31 - - - - - - 10:01 10:09 Treatment Length: 106 (minutes) Treatment Segments: 4 Vital Signs Capillary Blood Glucose Reference Range: 80 - 120 mg / dl HBO Diabetic Blood Glucose Intervention Range: <131 mg/dl or >287 mg/dl Time Vitals Blood Respiratory Capillary Blood Glucose Pulse Action Type: Pulse: Temperature: Taken: Pressure: Rate: Glucose (mg/dl): Meter #: Oximetry (%) Taken: Pre 08:11 126/58 97 20 98.3 212 Post 10:32 131/72 92 18 98.5 Treatment Response Treatment Toleration: Well Treatment Completion Status: Treatment Completed without Adverse Event Additional Procedure Documentation Tissue Sevierity: Fat layer exposed Physician HBO Attestation: I certify that I supervised this HBO treatment in accordance with  Medicare guidelines. A trained emergency response team is readily available per Yes hospital policies and procedures. Continue HBOT as ordered. Yes Electronic Signature(s) Signed: 05/23/2021 4:47:36 PM By: Geralyn Corwin DO Entered By: Geralyn Corwin on 05/23/2021 16:44:25 -------------------------------------------------------------------------------- HBO Safety Checklist Details Patient Name: Date of Service: [name], [name] 05/23/2021 8:00 A M Medical Record Number: [id number] Patient Account Number: [id number] Date of Birth/Sex: Treating RN: October 10, 1960 (60 y.o. [name] Primary Care Zell Doucette: [name] Other Clinician: Haywood Pao Referring Payton Prinsen: Treating Kmya Placide/Extender: Roselind Rily in Treatment: 6 HBO Safety Checklist Items Safety Checklist Consent Form Signed Patient voided / foley secured and emptied When did you last eato 0630 Last dose of injectable or oral agent 0630 Metformin Ostomy pouch emptied and vented if applicable NA All implantable devices assessed, documented and approved NA Intravenous access site secured and place NA Valuables secured Linens and cotton and cotton/polyester blend (less than 51% polyester) Personal oil-based products / skin lotions / body lotions removed Wigs or hairpieces removed NA Smoking or tobacco materials removed NA Books / newspapers / magazines / loose paper removed Cologne, aftershave, perfume and deodorant removed Jewelry removed (may wrap wedding band) Make-up removed Hair care products removed Battery operated devices (external) removed Heating patches and chemical warmers removed Titanium eyewear removed NA Nail polish cured greater than 10 hours NA Casting material cured greater than 10 hours NA Hearing aids removed NA Loose dentures or partials removed NA Prosthetics have been removed NA Patient demonstrates correct use of air break device  (if applicable) Patient concerns have been addressed Patient grounding bracelet on and cord attached to chamber Specifics for Inpatients (complete in addition to above) Medication sheet sent with patient NA Intravenous medications needed or due during therapy sent with patient NA Drainage tubes (e.g. nasogastric tube or chest tube secured and vented) NA Endotracheal or Tracheotomy  tube secured NA Cuff deflated of air and inflated with saline NA Airway suctioned NA Notes Paper version used prior to treatment. Data entered afterwards. Electronic Signature(s) Signed: 05/25/2021 6:10:01 PM By: Haywood Pao EMT Entered By: Haywood Pao on 05/23/2021 14:50:15

## 2021-05-25 NOTE — Progress Notes (Signed)
Dickmann, SAWSAN RIGGIO ([id number]) Visit Report for 05/23/2021 Arrival Information Details Patient Name: Date of Service: [place], [name] 05/23/2021 8:00 A M Medical Record Number: [id number] Patient Account Number: [id number] Date of Birth/Sex: Treating RN: Oct 01, 1960 (60 y.o. [name] Primary Care Soumya Colson: [name] Other Clinician: Haywood Pao Referring Teriyah Purington: Treating Doralene Glanz/Extender: Roselind Rily in Treatment: 6 Visit Information History Since Last Visit All ordered tests and consults were completed: Yes Patient Arrived: Wheel Chair Added or deleted any medications: No Arrival Time: 07:40 Any new allergies or adverse reactions: No Accompanied By: spouse Had a fall or experienced change in No Transfer Assistance: None activities of daily living that may affect Patient Identification Verified: Yes risk of falls: Secondary Verification Process Completed: Yes Signs or symptoms of abuse/neglect since last visito No Patient Requires Transmission-Based Precautions: No Hospitalized since last visit: No Patient Has Alerts: Yes Implantable device outside of the clinic excluding No Patient Alerts: [facility] 03/2021 cellular tissue based products placed in the center ABI: 1.24 since last visit: Pain Present Now: No Electronic Signature(s) Signed: 05/25/2021 6:06:22 PM By: Haywood Pao EMT Entered By: Haywood Pao on 05/25/2021 18:06:22 -------------------------------------------------------------------------------- Encounter Discharge Information Details Patient Name: Date of Service: [place], [name] 05/23/2021 8:00 A M Medical Record Number: [id number] Patient Account Number: [id number] Date of Birth/Sex: Treating RN: 06-07-1961 (60 y.o. [name] Primary Care Tamre Cass: [name] Other Clinician: Haywood Pao Referring Tylen Leverich: Treating Jazzmon Prindle/Extender: Roselind Rily in Treatment: 6 Encounter Discharge Information Items Discharge Condition: Stable Ambulatory Status: Wheelchair Discharge Destination: Home Transportation: Private Auto Accompanied By: spouse Schedule Follow-up Appointment: No Clinical Summary of Care: Electronic Signature(s) Signed: 05/25/2021 6:06:39 PM By: Haywood Pao EMT Entered By: Haywood Pao on 05/25/2021 18:06:39 -------------------------------------------------------------------------------- Vitals Details Patient Name: Date of Service: [place]dev[place], [name] 05/23/2021 8:00 A M Medical Record Number: [id number] Patient Account Number: [id number] Date of Birth/Sex: Treating RN: 1961-04-07 (60 y.o. [name] Primary Care Omario Ander: [name] Other Clinician: Haywood Pao Referring Denijah Karrer: Treating Makaria Poarch/Extender: Roselind Rily in Treatment: 6 Vital Signs Time Taken: 08:11 Temperature (F): 98.3 Height (in): 62 Pulse (bpm): 97 Weight (lbs): 323 Respiratory Rate (breaths/min): 20 Body Mass Index (BMI): 59.1 Blood Pressure (mmHg): 126/58 Capillary Blood Glucose (mg/dl): 299 Reference Range: 80 - 120 mg / dl Electronic Signature(s) Signed: 05/25/2021 6:10:01 PM By: Haywood Pao EMT Entered By: Haywood Pao on 05/23/2021 08:37:30

## 2021-05-26 ENCOUNTER — Encounter (HOSPITAL_BASED_OUTPATIENT_CLINIC_OR_DEPARTMENT_OTHER): Payer: 59 | Admitting: Internal Medicine

## 2021-05-26 ENCOUNTER — Emergency Department (HOSPITAL_BASED_OUTPATIENT_CLINIC_OR_DEPARTMENT_OTHER): Payer: 59

## 2021-05-26 ENCOUNTER — Inpatient Hospital Stay (HOSPITAL_BASED_OUTPATIENT_CLINIC_OR_DEPARTMENT_OTHER)
Admission: EM | Admit: 2021-05-26 | Discharge: 2021-06-08 | DRG: 854 | Disposition: A | Discharge status: Rehabilitation Facility | Payer: 59 | Attending: Internal Medicine | Admitting: Internal Medicine

## 2021-05-26 ENCOUNTER — Encounter (HOSPITAL_BASED_OUTPATIENT_CLINIC_OR_DEPARTMENT_OTHER): Payer: Self-pay | Admitting: *Deleted

## 2021-05-26 ENCOUNTER — Other Ambulatory Visit: Payer: Self-pay

## 2021-05-26 DIAGNOSIS — L97519 Non-pressure chronic ulcer of other part of right foot with unspecified severity: Secondary | ICD-10-CM | POA: Diagnosis present

## 2021-05-26 DIAGNOSIS — B952 Enterococcus as the cause of diseases classified elsewhere: Secondary | ICD-10-CM

## 2021-05-26 DIAGNOSIS — M86071 Acute hematogenous osteomyelitis, right ankle and foot: Secondary | ICD-10-CM

## 2021-05-26 DIAGNOSIS — D62 Acute posthemorrhagic anemia: Secondary | ICD-10-CM | POA: Diagnosis not present

## 2021-05-26 DIAGNOSIS — I10 Essential (primary) hypertension: Secondary | ICD-10-CM | POA: Diagnosis present

## 2021-05-26 DIAGNOSIS — I2510 Atherosclerotic heart disease of native coronary artery without angina pectoris: Secondary | ICD-10-CM | POA: Diagnosis present

## 2021-05-26 DIAGNOSIS — Z90710 Acquired absence of both cervix and uterus: Secondary | ICD-10-CM

## 2021-05-26 DIAGNOSIS — Z955 Presence of coronary angioplasty implant and graft: Secondary | ICD-10-CM

## 2021-05-26 DIAGNOSIS — S88111A Complete traumatic amputation at level between knee and ankle, right lower leg, initial encounter: Secondary | ICD-10-CM

## 2021-05-26 DIAGNOSIS — Z8249 Family history of ischemic heart disease and other diseases of the circulatory system: Secondary | ICD-10-CM

## 2021-05-26 DIAGNOSIS — E1140 Type 2 diabetes mellitus with diabetic neuropathy, unspecified: Secondary | ICD-10-CM | POA: Diagnosis present

## 2021-05-26 DIAGNOSIS — M3500 Sicca syndrome, unspecified: Secondary | ICD-10-CM | POA: Diagnosis present

## 2021-05-26 DIAGNOSIS — A4181 Sepsis due to Enterococcus: Principal | ICD-10-CM | POA: Diagnosis present

## 2021-05-26 DIAGNOSIS — T2131XD Burn of third degree of chest wall, subsequent encounter: Secondary | ICD-10-CM

## 2021-05-26 DIAGNOSIS — Z79899 Other long term (current) drug therapy: Secondary | ICD-10-CM

## 2021-05-26 DIAGNOSIS — A419 Sepsis, unspecified organism: Secondary | ICD-10-CM | POA: Diagnosis present

## 2021-05-26 DIAGNOSIS — R7881 Bacteremia: Secondary | ICD-10-CM

## 2021-05-26 DIAGNOSIS — E1165 Type 2 diabetes mellitus with hyperglycemia: Secondary | ICD-10-CM | POA: Diagnosis present

## 2021-05-26 DIAGNOSIS — E669 Obesity, unspecified: Secondary | ICD-10-CM | POA: Diagnosis present

## 2021-05-26 DIAGNOSIS — E11621 Type 2 diabetes mellitus with foot ulcer: Secondary | ICD-10-CM

## 2021-05-26 DIAGNOSIS — E785 Hyperlipidemia, unspecified: Secondary | ICD-10-CM | POA: Diagnosis present

## 2021-05-26 DIAGNOSIS — K760 Fatty (change of) liver, not elsewhere classified: Secondary | ICD-10-CM | POA: Diagnosis present

## 2021-05-26 DIAGNOSIS — I5042 Chronic combined systolic (congestive) and diastolic (congestive) heart failure: Secondary | ICD-10-CM | POA: Diagnosis present

## 2021-05-26 DIAGNOSIS — E1169 Type 2 diabetes mellitus with other specified complication: Secondary | ICD-10-CM | POA: Diagnosis present

## 2021-05-26 DIAGNOSIS — L03115 Cellulitis of right lower limb: Secondary | ICD-10-CM

## 2021-05-26 DIAGNOSIS — M86172 Other acute osteomyelitis, left ankle and foot: Secondary | ICD-10-CM

## 2021-05-26 DIAGNOSIS — I480 Paroxysmal atrial fibrillation: Secondary | ICD-10-CM | POA: Diagnosis present

## 2021-05-26 DIAGNOSIS — M351 Other overlap syndromes: Secondary | ICD-10-CM | POA: Diagnosis present

## 2021-05-26 DIAGNOSIS — K219 Gastro-esophageal reflux disease without esophagitis: Secondary | ICD-10-CM | POA: Diagnosis present

## 2021-05-26 DIAGNOSIS — M869 Osteomyelitis, unspecified: Secondary | ICD-10-CM | POA: Diagnosis present

## 2021-05-26 DIAGNOSIS — G546 Phantom limb syndrome with pain: Secondary | ICD-10-CM | POA: Diagnosis not present

## 2021-05-26 DIAGNOSIS — Z87891 Personal history of nicotine dependence: Secondary | ICD-10-CM

## 2021-05-26 DIAGNOSIS — R6520 Severe sepsis without septic shock: Secondary | ICD-10-CM | POA: Diagnosis present

## 2021-05-26 DIAGNOSIS — Z7984 Long term (current) use of oral hypoglycemic drugs: Secondary | ICD-10-CM

## 2021-05-26 DIAGNOSIS — Z801 Family history of malignant neoplasm of trachea, bronchus and lung: Secondary | ICD-10-CM

## 2021-05-26 DIAGNOSIS — Z6841 Body Mass Index (BMI) 40.0 and over, adult: Secondary | ICD-10-CM

## 2021-05-26 DIAGNOSIS — Z79890 Hormone replacement therapy (postmenopausal): Secondary | ICD-10-CM

## 2021-05-26 DIAGNOSIS — S88111D Complete traumatic amputation at level between knee and ankle, right lower leg, subsequent encounter: Secondary | ICD-10-CM

## 2021-05-26 DIAGNOSIS — I110 Hypertensive heart disease with heart failure: Secondary | ICD-10-CM | POA: Diagnosis present

## 2021-05-26 DIAGNOSIS — E039 Hypothyroidism, unspecified: Secondary | ICD-10-CM | POA: Diagnosis present

## 2021-05-26 DIAGNOSIS — G2581 Restless legs syndrome: Secondary | ICD-10-CM | POA: Diagnosis present

## 2021-05-26 DIAGNOSIS — M069 Rheumatoid arthritis, unspecified: Secondary | ICD-10-CM | POA: Diagnosis present

## 2021-05-26 DIAGNOSIS — I890 Lymphedema, not elsewhere classified: Secondary | ICD-10-CM | POA: Diagnosis present

## 2021-05-26 DIAGNOSIS — K580 Irritable bowel syndrome with diarrhea: Secondary | ICD-10-CM | POA: Diagnosis present

## 2021-05-26 DIAGNOSIS — I350 Nonrheumatic aortic (valve) stenosis: Secondary | ICD-10-CM | POA: Diagnosis present

## 2021-05-26 DIAGNOSIS — Z800 Family history of malignant neoplasm of digestive organs: Secondary | ICD-10-CM

## 2021-05-26 DIAGNOSIS — M86671 Other chronic osteomyelitis, right ankle and foot: Secondary | ICD-10-CM | POA: Diagnosis present

## 2021-05-26 DIAGNOSIS — L97512 Non-pressure chronic ulcer of other part of right foot with fat layer exposed: Secondary | ICD-10-CM

## 2021-05-26 DIAGNOSIS — Z20822 Contact with and (suspected) exposure to covid-19: Secondary | ICD-10-CM | POA: Diagnosis present

## 2021-05-26 DIAGNOSIS — J4520 Mild intermittent asthma, uncomplicated: Secondary | ICD-10-CM | POA: Diagnosis present

## 2021-05-26 DIAGNOSIS — G894 Chronic pain syndrome: Secondary | ICD-10-CM | POA: Diagnosis present

## 2021-05-26 DIAGNOSIS — J45909 Unspecified asthma, uncomplicated: Secondary | ICD-10-CM | POA: Diagnosis present

## 2021-05-26 DIAGNOSIS — M797 Fibromyalgia: Secondary | ICD-10-CM | POA: Diagnosis present

## 2021-05-26 DIAGNOSIS — Z7982 Long term (current) use of aspirin: Secondary | ICD-10-CM

## 2021-05-26 LAB — CBC WITH DIFFERENTIAL/PLATELET
Abs Immature Granulocytes: 0.15 10*3/uL — ABNORMAL HIGH (ref 0.00–0.07)
Basophils Absolute: 0.1 10*3/uL (ref 0.0–0.1)
Basophils Relative: 0 %
Eosinophils Absolute: 0 10*3/uL (ref 0.0–0.5)
Eosinophils Relative: 0 %
HCT: 40.7 % (ref 36.0–46.0)
Hemoglobin: 12.6 g/dL (ref 12.0–15.0)
Immature Granulocytes: 1 %
Lymphocytes Relative: 5 %
Lymphs Abs: 0.9 10*3/uL (ref 0.7–4.0)
MCH: 25.5 pg — ABNORMAL LOW (ref 26.0–34.0)
MCHC: 31 g/dL (ref 30.0–36.0)
MCV: 82.4 fL (ref 80.0–100.0)
Monocytes Absolute: 0.8 10*3/uL (ref 0.1–1.0)
Monocytes Relative: 4 %
Neutro Abs: 19 10*3/uL — ABNORMAL HIGH (ref 1.7–7.7)
Neutrophils Relative %: 90 %
Platelets: 278 10*3/uL (ref 150–400)
RBC: 4.94 MIL/uL (ref 3.87–5.11)
RDW: 17.2 % — ABNORMAL HIGH (ref 11.5–15.5)
WBC: 20.9 10*3/uL — ABNORMAL HIGH (ref 4.0–10.5)
nRBC: 0 % (ref 0.0–0.2)

## 2021-05-26 LAB — PROTIME-INR
INR: 1.1 (ref 0.8–1.2)
Prothrombin Time: 14.4 seconds (ref 11.4–15.2)

## 2021-05-26 LAB — COMPREHENSIVE METABOLIC PANEL
ALT: 33 U/L (ref 0–44)
AST: 42 U/L — ABNORMAL HIGH (ref 15–41)
Albumin: 3.9 g/dL (ref 3.5–5.0)
Alkaline Phosphatase: 48 U/L (ref 38–126)
Anion gap: 14 (ref 5–15)
BUN: 9 mg/dL (ref 6–20)
CO2: 24 mmol/L (ref 22–32)
Calcium: 9.4 mg/dL (ref 8.9–10.3)
Chloride: 98 mmol/L (ref 98–111)
Creatinine, Ser: 1.05 mg/dL — ABNORMAL HIGH (ref 0.44–1.00)
GFR, Estimated: >60 mL/min (ref >60)
Glucose, Bld: 207 mg/dL — ABNORMAL HIGH (ref 70–99)
Potassium: 4.9 mmol/L (ref 3.5–5.1)
Sodium: 136 mmol/L (ref 135–145)
Total Bilirubin: 0.7 mg/dL (ref 0.3–1.2)
Total Protein: 7.8 g/dL (ref 6.5–8.1)

## 2021-05-26 LAB — GLUCOSE, CAPILLARY: Glucose-Capillary: 191 mg/dL — ABNORMAL HIGH (ref 70–99)

## 2021-05-26 LAB — LACTIC ACID, PLASMA: Lactic Acid, Venous: 3.3 mmol/L (ref 0.5–1.9)

## 2021-05-26 LAB — APTT: aPTT: 30 seconds (ref 24–36)

## 2021-05-26 LAB — RESP PANEL BY RT-PCR (FLU A&B, COVID) ARPGX2
Influenza A by PCR: NEGATIVE
Influenza B by PCR: NEGATIVE
SARS Coronavirus 2 by RT PCR: NEGATIVE

## 2021-05-26 MED ORDER — ACETAMINOPHEN 500 MG PO TABS: 1000.0000 mg | ORAL_TABLET | Freq: Once | ORAL | Status: DC

## 2021-05-26 MED ORDER — VANCOMYCIN HCL IN DEXTROSE 1-5 GM/200ML-% IV SOLN
1000.0000 mg | INTRAVENOUS | Status: AC
Start: 2021-05-26 — End: 2021-05-26
  Administered 2021-05-26: 1000 mg via INTRAVENOUS
  Filled 2021-05-26: qty 200

## 2021-05-26 MED ORDER — ACETAMINOPHEN 325 MG PO TABS
650.0000 mg | ORAL_TABLET | Freq: Four times a day (QID) | ORAL | Status: DC
  Administered 2021-05-27 – 2021-06-02 (×22): 650 mg via ORAL
  Filled 2021-05-26 (×24): qty 2

## 2021-05-26 MED ORDER — GABAPENTIN 300 MG PO CAPS
400.0000 mg | ORAL_CAPSULE | Freq: Three times a day (TID) | ORAL | Status: AC
  Administered 2021-05-26: 400 mg via ORAL
  Filled 2021-05-26: qty 1

## 2021-05-26 MED ORDER — VANCOMYCIN HCL 1500 MG/300ML IV SOLN
1500.0000 mg | INTRAVENOUS | Status: DC
  Filled 2021-05-26: qty 300

## 2021-05-26 MED ORDER — SODIUM CHLORIDE 0.9 % IV SOLN
12.5000 mg | Freq: Four times a day (QID) | INTRAVENOUS | Status: DC | PRN
  Administered 2021-05-26: 12.5 mg via INTRAVENOUS
  Filled 2021-05-26: qty 0.5

## 2021-05-26 MED ORDER — SODIUM CHLORIDE 0.9 % IV SOLN
2.0000 g | Freq: Three times a day (TID) | INTRAVENOUS | Status: DC
  Administered 2021-05-26: 2 g via INTRAVENOUS
  Filled 2021-05-26: qty 2

## 2021-05-26 MED ORDER — SODIUM CHLORIDE 0.9 % IV SOLN: INTRAVENOUS | Status: AC

## 2021-05-26 MED ORDER — VANCOMYCIN HCL 10 G IV SOLR
2500.0000 mg | Freq: Once | INTRAVENOUS | Status: DC
  Filled 2021-05-26: qty 25

## 2021-05-26 MED ORDER — SODIUM CHLORIDE 0.9 % IV BOLUS (SEPSIS)
500.0000 mL | Freq: Once | INTRAVENOUS | Status: AC
  Administered 2021-05-26: 500 mL via INTRAVENOUS

## 2021-05-26 MED ORDER — FENTANYL CITRATE PF 50 MCG/ML IJ SOSY
50.0000 ug | PREFILLED_SYRINGE | INTRAMUSCULAR | Status: AC | PRN
  Administered 2021-05-26 (×2): 50 ug via INTRAVENOUS
  Filled 2021-05-26 (×2): qty 1

## 2021-05-26 MED ORDER — ACETAMINOPHEN 325 MG PO TABS
650.0000 mg | ORAL_TABLET | Freq: Once | ORAL | Status: AC
  Administered 2021-05-26: 650 mg via ORAL
  Filled 2021-05-26: qty 2
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
## 2021-05-26 NOTE — Progress Notes (Addendum)
[name], [name] ([id number]) Visit Report for 05/26/2021 HBO Details Patient Name: Date of Service: [name], [name] 05/26/2021 8:00 A M Medical Record Number: [id number] Patient Account Number: [id number] Date of Birth/Sex: Treating RN: [birth year] (60 y.o. [name], [name] Primary Care Emmamae Mcnamara: [name] Other Clinician: Haywood Pao Referring Kellie Murrill: Treating Clarabelle Oscarson/Extender: Lynnell Catalan in Treatment: 7 HBO Treatment Course Details Treatment Course Number: 1 Ordering Aideen Fenster: Geralyn Corwin T Treatments Ordered: otal 40 HBO Treatment Start 05/09/2021 Date: HBO Indication: Diabetic Ulcer(s) of the Lower Extremity HBO Treatment End 05/26/2021 Date: HBO Discharge Treatment Series Incomplete; Medical Event- Outcome: Unrelated to HBO HBO Treatment Details Treatment Number: 4 Patient Type: Outpatient Chamber Type: Monoplace Chamber Serial #: [id number] Treatment Protocol: 2.0 ATA with 90 minutes oxygen, and no air breaks Treatment Details Compression Rate Down: 1.0 psi / minute De-Compression Rate Up: 1.0 psi / minute Air breaks and breathing Decompress Decompress Compress Tx Pressure Begins Reached periods Begins Ends (leave unused spaces blank) Chamber Pressure (ATA 1 2 ------2 1 ) Clock Time (24 hr) 08:43 - ------- 09:00 Treatment Length: 17 (minutes) Treatment Segments: 1 Vital Signs Capillary Blood Glucose Reference Range: 80 - 120 mg / dl HBO Diabetic Blood Glucose Intervention Range: <131 mg/dl or >185 mg/dl Time Vitals Blood Respiratory Capillary Blood Glucose Pulse Action Type: Pulse: Temperature: Taken: Pressure: Rate: Glucose (mg/dl): Meter #: Oximetry (%) Taken: Pre 08:28 183/75 92 16 98.2 191 Treatment Response Treatment Toleration: Poor Adverse Events: 1:Barotrauma - Sinus Treatment Completion Status: Treatment Aborted/Not Restarted Reason: Adverse Event Treatment Notes Patient was placed in  chamber and attempted treatment. Patient stated that she was having pain in her forehead at approximately 5 psi of pressure. This pain was relieved by reducing pressure back to 2 psi. Attempts were made 3 times to increase pressure and each time patient experienced pain at approximately 5 psi. Patient stated that she could get through the pain and I instructed her not to try to get through the pain as this was sinus related. I offered for her to take Afrin spray which she declined saying that she had already used Afrin spray this morning. Additional Procedure Documentation Tissue Sevierity: Fat layer exposed Shalece Staffa Notes Patient did not tolerate treatments did not get the depth. Apparently related to sinus issues. I did not actually see her Physician HBO Attestation: I certify that I supervised this HBO treatment in accordance with Medicare guidelines. A trained emergency response team is readily available per Yes hospital policies and procedures. Continue HBOT as ordered. Yes Electronic Signature(s) Signed: 06/10/2021 1:33:34 PM By: Haywood Pao EMT Signed: 06/24/2021 12:39:46 PM By: Baltazar Najjar MD Previous Signature: 05/26/2021 4:57:28 PM Version By: Baltazar Najjar MD Entered By: Haywood Pao on 06/10/2021 13:33:33 -------------------------------------------------------------------------------- HBO Safety Checklist Details Patient Name: Date of Service: [name], [name] 05/26/2021 8:00 A M Medical Record Number: [id number] Patient Account Number: [id number] Date of Birth/Sex: Treating RN: 27-May-1961 (60 y.o. Arta Silence Primary Care Karmelo Bass: [name] Other Clinician: Haywood Pao Referring Malvika Tung: Treating Naji Mehringer/Extender: Lynnell Catalan in Treatment: 7 HBO Safety Checklist Items Safety Checklist Consent Form Signed Patient voided / foley secured and emptied When did you last eato 0600 Last dose of injectable or  oral agent 0600 Ostomy pouch emptied and vented if applicable NA All implantable devices assessed, documented and approved NA Intravenous access site secured and place NA Valuables secured Linens and cotton and cotton/polyester blend (less than 51% polyester) Personal oil-based products / skin lotions /  body lotions removed Wigs or hairpieces removed NA Smoking or tobacco materials removed NA Books / newspapers / magazines / loose paper removed Cologne, aftershave, perfume and deodorant removed Jewelry removed (may wrap wedding band) Make-up removed Hair care products removed Battery operated devices (external) removed Heating patches and chemical warmers removed Titanium eyewear removed Nail polish cured greater than 10 hours NA Casting material cured greater than 10 hours NA Hearing aids removed NA Loose dentures or partials removed NA Prosthetics have been removed NA Patient demonstrates correct use of air break device (if applicable) Patient concerns have been addressed Patient grounding bracelet on and cord attached to chamber Specifics for Inpatients (complete in addition to above) Medication sheet sent with patient NA Intravenous medications needed or due during therapy sent with patient NA Drainage tubes (e.g. nasogastric tube or chest tube secured and vented) NA Endotracheal or Tracheotomy tube secured NA Cuff deflated of air and inflated with saline NA Airway suctioned NA Electronic Signature(s) Signed: 05/26/2021 6:51:22 PM By: Haywood Pao EMT Entered By: Haywood Pao on 05/26/2021 12:50:05
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
## 2021-05-26 NOTE — ED Provider Notes (Signed)
[facility] EMERGENCY DEPARTMENT Provider Note   CSN: [id number] Arrival date & time: 05/26/21  1617     History Chief Complaint  Patient presents with   Wound Check    [name] is a 59 y.o. female.  HPI Patient is a 60 year old female with past medical history detailed below notable for DM2, she has had amputation of right fourth and fifth toe in the past  She is found to have osteomyelitis on MRI historically consulted with Dr. Lajoyce Corners who recommended amputation however patient declined this.  She was sent to follow-up with wound care with whom she did a 6-week course of doxycycline for osteomyelitis and has been doing hyperbaric chamber treatments  Patient states that she went to wound care today and was told that her wound looked worse she was restarted on doxycycline she has not been on this for at least 3 weeks  She was told to come to the ER for worsening symptoms she states after she left the wound care center she developed a fever started feeling more fatigued, tired, weak and came to the ER for evaluation.  She states that she is having pain in her right lower extremity she states it is achy constant.  She states that she takes 10 mg Percocets twice daily for her fibromyalgia pain  She denies any nausea or vomiting.  She says that she is having a mild cough but denies any hemoptysis chest pain or new shortness of breath.  No headache neck stiffness denies any urinary frequency urgency or hematuria.     Past Medical History:  Diagnosis Date   Aortic stenosis    mild AS by echo 02/2021   Asthma    "on daily RX and rescue inhaler" (04/18/2018)   Chronic diastolic CHF (congestive heart failure) (HCC) 09/2015   Chronic lower back pain    Chronic neck pain    Chronic pain syndrome    Fentanyl Patch   Colon polyps    Coronary artery disease    cath with normal LM, 30% LAD, 85% mid RCA and 95% distal RCA s/p PCI of the mid to distal RCA and now on DAPT with  ASA and Ticagrelor.     Eczema    Excessive daytime sleepiness 11/26/2015   Fibromyalgia    Gallstones    GERD (gastroesophageal reflux disease)    Heart murmur    "noted for the 1st time on 04/18/2018"   History of blood transfusion 07/2010   "S/P oophorectomy"   History of gout    History of hiatal hernia 1980s   "gone now" (04/18/2018)   Hyperlipidemia    Hypertension    takes Metoprolol and Enalapril daily   Hypothyroidism    takes Synthroid daily   IBS (irritable bowel syndrome)    Migraine    "nothing in the 2000s" (04/18/2018)   Mixed connective tissue disease (HCC)    NAFLD (nonalcoholic fatty liver disease)    Pneumonia    "several times" (04/18/2018)   PVC's (premature ventricular contractions)    noted on event monitor 02/2021   Rheumatoid arthritis (HCC)    "hands, elbows, shoulders, probably knees" (04/18/2018)   Scoliosis    Spondylosis    Type II diabetes mellitus (HCC)    takes Metformin and Hum R daily (04/18/2018)    Patient Active Problem List   Diagnosis Date Noted   Foot osteomyelitis (HCC) 05/26/2021   Digestive disorder 05/18/2021   Bilateral lower extremity edema 05/02/2021  Statin myopathy 03/04/2021   Aortic stenosis 03/04/2021   Sjogren's syndrome (HCC) 02/11/2020   Acute hematogenous osteomyelitis of right foot (HCC) 01/28/2020   Cutaneous abscess of right foot    Subacute osteomyelitis, right ankle and foot (HCC)    Statin intolerance 08/16/2019   PAF (paroxysmal atrial fibrillation) (HCC) 01/06/2019   Gram-negative bacteremia 10/18/2018   Streptococcal bacteremia 10/18/2018   Ulcer of lower extremity, limited to breakdown of skin (HCC) 10/03/2018   CAD S/P percutaneous coronary angioplasty 04/19/2018   Essential hypertension    Moderate persistent asthma 03/21/2018   NAFLD (nonalcoholic fatty liver disease)    Recurrent cellulitis of lower extremity 01/02/2018   Cellulitis of lower leg 01/02/2018   Chronic diarrhea 05/08/2017   H/O  Clostridium difficile infection 05/08/2017   Rectal bleeding 05/08/2017   Hyperbilirubinemia 04/29/2016   Hyponatremia 04/29/2016   Cellulitis of leg, right 03/09/2016   Mild persistent asthma 02/15/2016   Allergic rhinitis due to pollen 02/15/2016   Anaphylactic reaction due to food 02/10/2016   Diabetes mellitus type 2 in obese (HCC) 02/10/2016   Coronary artery disease involving native coronary artery of native heart without angina pectoris 02/10/2016   Gastroesophageal reflux disease without esophagitis 02/10/2016   Atopic eczema 02/10/2016   Cervical nerve root disorder 12/15/2015   Lumbar radiculopathy 12/15/2015   Daytime somnolence 11/26/2015   Venous stasis dermatitis of both lower extremities 02/11/2015   Morbid obesity (HCC) 06/19/2014   B-complex deficiency 03/26/2014   Benign essential HTN 03/26/2014   Chronic pain associated with significant psychosocial dysfunction 03/26/2014   Diaphragmatic hernia 03/26/2014   Gastroesophageal reflux disease 03/26/2014   Hammer toe 03/26/2014   H/O neoplasm 03/26/2014   Adaptive colitis 03/26/2014   Diabetic polyneuropathy (HCC) 03/26/2014   Deafness, sensorineural 03/26/2014   Fibromyalgia 01/20/2014   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/20/2014   Insulin dependent diabetes mellitus (HCC) 11/11/2012   Hyperlipidemia 11/11/2012   Mixed connective tissue disease (HCC) 11/11/2012   Hypothyroidism 11/11/2012   Uncomplicated asthma 11/11/2012   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HCC) 02/20/2012   Mixed collagen vascular disease 02/20/2012   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   ANA positive 07/17/2011    Past Surgical History:  Procedure Laterality Date   ABDOMINAL HYSTERECTOMY  06/2005   "w/right ovariy"   AMPUTATION Right 01/28/2020    RIGHT FOURTH AND FIFTH RAY AMPUTATION   AMPUTATION Right 01/28/2020   Procedure: RIGHT FOURTH AND FIFTH RAY AMPUTATION,;  Surgeon: [name], MD;  Location: [facility] OR;   Service: Orthopedics;  Laterality: Right;   APPENDECTOMY     BREAST BIOPSY Bilateral    9 total (04/18/2018)   CORONARY ANGIOPLASTY WITH STENT PLACEMENT  10/01/2015   normal LM, 30% LAD, 85% mid RCA and 95% distal RCA s/p PCI of the mid to distal RCA and now on DAPT with ASA and Ticagrelor.     CORONARY STENT INTERVENTION Right 04/18/2018   Procedure: CORONARY STENT INTERVENTION;  Surgeon: [name], MD;  Location: [facility] INVASIVE CV LAB;  Service: Cardiovascular;  Laterality: Right;   DILATION AND CURETTAGE OF UTERUS     FRACTURE SURGERY     LAPAROSCOPIC CHOLECYSTECTOMY     LEFT HEART CATH AND CORONARY ANGIOGRAPHY N/A 04/18/2018   Procedure: LEFT HEART CATH AND CORONARY ANGIOGRAPHY;  Surgeon: [name], MD;  Location: [facility] INVASIVE CV LAB;  Service: Cardiovascular;  Laterality: N/A;   MUSCLE BIOPSY Left    "leg"   OOPHORECTOMY  07/2010   RADIOLOGY  WITH ANESTHESIA N/A 05/25/2016   Procedure: RADIOLOGY WITH ANESTHESIA;  Surgeon: Medication Radiologist, MD;  Location: [facility] OR;  Service: Radiology;  Laterality: N/A;   TONSILLECTOMY AND ADENOIDECTOMY     WRIST FRACTURE SURGERY Left    "crushed it"     OB History   No obstetric history on file.     Family History  Problem Relation Age of Onset   CAD Mother    Hypertension Mother    Heart attack Mother    CAD Father    Heart attack Father    Allergic rhinitis Father    Asthma Father    Hypertension Brother    Hypertension Brother    Pancreatic cancer Paternal Aunt    Breast cancer Paternal Aunt    Lung cancer Paternal Aunt    Asthma Son     Social History   Tobacco Use   Smoking status: Former    Packs/day: 1.00    Years: 19.00    Pack years: 19.00    Types: Cigarettes    Start date: 59    Quit date: 02/11/2004    Years since quitting: 17.2   Smokeless tobacco: Never  Vaping Use   Vaping Use: Never used  Substance Use Topics   Alcohol use: Yes    Comment: RARE   Drug use: Not Currently    Types:  Marijuana    Comment: "only in my teens"    Home Medications Prior to Admission medications   Medication Sig Start Date End Date Taking? Authorizing Provider  acetaminophen (TYLENOL) 650 MG CR tablet Take 1,300 mg by mouth in the morning and at bedtime.    [provider]  albuterol (PROVENTIL) (2.5 MG/3ML) 0.083% nebulizer solution USE 1 VIAL IN NEBULIZER EVERY 4 HOURS AS NEEDED FOR WHEEZING OR  FOR  SHORTNESS  OF  BREATH 08/02/20   Wendling, Jilda Roche, DO  albuterol (VENTOLIN HFA) 108 (90 Base) MCG/ACT inhaler Inhale 2 puffs into the lungs every 4 (four) hours as needed for wheezing or shortness of breath. 01/24/21   Sharlene Dory, DO  aspirin EC 81 MG tablet Take 81 mg by mouth daily.    [provider]  Bempedoic Acid-Ezetimibe (NEXLIZET) 180-10 MG TABS Take 1 tablet by mouth daily. 03/07/21   Quintella Reichert, MD  clotrimazole-betamethasone (LOTRISONE) cream Apply 1 application topically 2 (two) times daily. 10/27/20   Sharlene Dory, DO  cyclobenzaprine (FLEXERIL) 10 MG tablet Take 0.5-1 tablets (5-10 mg total) by mouth 3 (three) times daily as needed for muscle spasms. 05/18/21   Sharlene Dory, DO  diltiazem (DILT-XR) 240 MG 24 hr capsule Take 1 capsule (240 mg total) by mouth daily. 02/02/21   Quintella Reichert, MD  diphenhydrAMINE (BENADRYL) 25 MG tablet Take 25 mg by mouth daily as needed for allergies.    [provider]  doxycycline (VIBRA-TABS) 100 MG tablet Take 1 tablet (100 mg total) by mouth 2 (two) times daily. 02/23/21   [name], MD  EPINEPHrine (EPIPEN 2-PAK) 0.3 mg/0.3 mL IJ SOAJ injection USE AS DIRECTED FOR SEVERE ALLERGIC REACTION. 09/08/19   Sharlene Dory, DO  esomeprazole (NEXIUM) 40 MG capsule Take 40 mg by mouth daily as needed (Heartburn).    [provider]  fluconazole (DIFLUCAN) 150 MG tablet Take 1 tablet (150 mg total) by mouth every 3 (three) days. 05/02/21   Sharlene Dory, DO   fluticasone (FLONASE) 50 MCG/ACT nasal spray USE 2 SPRAY(S) IN EACH NOSTRIL  ONCE DAILY AS NEEDED FOR  ALLERGIES  OR  RHINITIS 04/06/20   Sharlene Dory, DO  furosemide (LASIX) 20 MG tablet Take 0.5 tablets (10 mg total) by mouth daily. As needed for swelling 02/04/20 02/03/21  Persons, [place], PA  gabapentin (NEURONTIN) 300 MG capsule Take 1,600 mg by mouth as directed. 400 mg in morning, 400 mg at noon, 800 mg at night    [provider]  glucose blood test strip 1 each 4 (four) times daily.  03/28/17   [provider]  HUMULIN R 500 UNIT/ML injection INJECT 0.08-0.3 MLS (40-150 UNITS TOTAL) INTO THE SKIN THREE TIMES DAILY WITH MEALS 04/20/21   Wendling, Jilda Roche, DO  hydroxychloroquine (PLAQUENIL) 200 MG tablet Take 1 tablet (200 mg total) by mouth 2 (two) times daily. 02/20/20   Wendling, Jilda Roche, DO  INSULIN SYRINGE 1CC/29G (B-D INSULIN SYRINGE) 29G X 1/2" 1 ML MISC Use three times daily with insulin.  DX E11.9 06/11/20   Sharlene Dory, DO  isosorbide mononitrate (IMDUR) 30 MG 24 hr tablet Take 1 tablet (30 mg total) by mouth daily. 02/02/21   Quintella Reichert, MD  levocetirizine (XYZAL) 5 MG tablet TAKE 1 TABLET BY MOUTH ONCE DAILY IN THE EVENING 05/02/21   Sharlene Dory, DO  levothyroxine (SYNTHROID) 200 MCG tablet TAKE 1 TABLET BY MOUTH ONCE DAILY BEFORE BREAKFAST 05/02/21   Sharlene Dory, DO  magnesium oxide (MAG-OX) 400 MG tablet Take 400-1,600 mg by mouth 2 (two) times daily as needed (cramps).     [provider]  metFORMIN (GLUCOPHAGE-XR) 500 MG 24 hr tablet Take 2 tablets by mouth twice daily 03/28/21   Carmelia Roller, Jilda Roche, DO  nitroGLYCERIN (NITROSTAT) 0.4 MG SL tablet DISSOLVE ONE TABLET UNDER THE TONGUE EVERY 5 MINUTES AS NEEDED FOR CHEST PAIN.  DO NOT EXCEED A TOTAL OF 3 DOSES IN 15 MINUTES 10/25/20   Turner, Cornelious Bryant, MD  Oxycodone HCl 10 MG TABS Take 10 mg by mouth every 6 (six) hours as needed (pain).  12/17/19    [provider]  potassium bicarbonate (K-LYTE) 25 MEQ disintegrating tablet Take 25 mEq daily with every dose of Lasix. 05/02/21   Sharlene Dory, DO  pramipexole (MIRAPEX) 0.5 MG tablet Take 6 mg by mouth See admin instructions. Take 0.5 mg up to 6 times daily as needed for restless legs    [provider]  promethazine (PHENERGAN) 25 MG tablet TAKE 1 TABLET BY MOUTH EVERY 8 HOURS AS NEEDED FOR NAUSEA AND FOR VOMITING 03/01/21   Wendling, Jilda Roche, DO  saccharomyces boulardii (FLORASTOR) 250 MG capsule Take 250 mg by mouth 3 (three) times daily.    [provider]  valsartan (DIOVAN) 160 MG tablet Take 1 tablet (160 mg total) by mouth daily. 02/02/21   Quintella Reichert, MD  Vitamin D, Ergocalciferol, (DRISDOL) 1.25 MG (50000 UNIT) CAPS capsule TAKE 1 CAPSULE BY MOUTH ONCE A WEEK (EVERY  7  DAYS)  TAKE  ON  FRIDAY 03/01/21   Sharlene Dory, DO    Allergies    Fish allergy, Fish oil, Omega-3 fatty acids, Crestor [rosuvastatin], Gabapentin, Lovastatin, Metronidazole, Red yeast rice [cholestin], Rotigotine, Atrovent hfa [ipratropium bromide hfa], Red yeast rice extract, Victoza [liraglutide], Enalapril, Suprep [na sulfate-k sulfate-mg sulf], and Tape  Review of Systems   Review of Systems  Constitutional:  Positive for fever. Negative for chills.  HENT:  Negative for congestion.   Eyes:  Negative for pain.  Respiratory:  Negative for cough and shortness of breath.   Cardiovascular:  Negative for chest pain and leg swelling.  Gastrointestinal:  Negative for abdominal pain, nausea and vomiting.  Genitourinary:  Negative for dysuria.  Musculoskeletal:  Positive for myalgias.       Right foot pain redness and swelling  Skin:  Negative for rash.  Neurological:  Negative for dizziness and headaches.   Physical Exam Updated Vital Signs BP (!) 142/64   Pulse (!) 124   Temp (!) 102.9 F (39.4 C) (Oral)   Resp (!) 25   Ht 5\' 2"  (1.575 m)   Wt (!)  149.7 kg   SpO2 96%   BMI 60.36 kg/m   Physical Exam Vitals and nursing note reviewed.  Constitutional:      General: She is not in acute distress.    Appearance: She is obese.     Comments: Morbidly obese chronically ill-appearing 60 year old female in no acute distress with tachypnea  HENT:     Head: Normocephalic and atraumatic.     Nose: Nose normal.  Eyes:     General: No scleral icterus. Cardiovascular:     Rate and Rhythm: Regular rhythm. Tachycardia present.     Pulses: Normal pulses.     Heart sounds: Normal heart sounds.  Pulmonary:     Effort: Pulmonary effort is normal. No respiratory distress.     Breath sounds: No wheezing.  Abdominal:     Palpations: Abdomen is soft.     Tenderness: There is no abdominal tenderness.  Musculoskeletal:     Cervical back: Normal range of motion.     Right lower leg: Edema present.     Left lower leg: No edema.     Comments: Redness and swelling of the right foot see pictures below.  There is some tenderness to palpation of the first and second digit of the left foot she with small little bruising on the second distal phalanx  Skin:    General: Skin is warm and dry.     Capillary Refill: Capillary refill takes less than 2 seconds.  Neurological:     Mental Status: She is alert. Mental status is at baseline.  Psychiatric:        Mood and Affect: Mood normal.        Behavior: Behavior normal.       ED Results / Procedures / Treatments   Labs (all labs ordered are listed, but only abnormal results are displayed) Labs Reviewed  LACTIC ACID, PLASMA - Abnormal; Notable for the following components:      Result Value   Lactic Acid, Venous 3.3 (*)    All other components within normal limits  COMPREHENSIVE METABOLIC PANEL - Abnormal; Notable for the following components:   Glucose, Bld 207 (*)    Creatinine, Ser 1.05 (*)    AST 42 (*)    All other components within normal limits  CBC WITH DIFFERENTIAL/PLATELET - Abnormal;  Notable for the following components:   WBC 20.9 (*)    MCH 25.5 (*)    RDW 17.2 (*)    Neutro Abs 19.0 (*)    Abs Immature Granulocytes 0.15 (*)    All other components within normal limits  RESP PANEL BY RT-PCR (FLU A&B, COVID) ARPGX2  URINE CULTURE  CULTURE, BLOOD (ROUTINE X 2)  CULTURE, BLOOD (ROUTINE X 2)  PROTIME-INR  APTT  LACTIC ACID, PLASMA  URINALYSIS, ROUTINE W REFLEX MICROSCOPIC  PREGNANCY, URINE  BASIC METABOLIC PANEL  CBC  EKG EKG Interpretation  Date/Time:  Thursday May 26 2021 17:06:44 EDT Ventricular Rate:  120 PR Interval:  168 QRS Duration: 91 QT Interval:  312 QTC Calculation: 441 R Axis:   -71 Text Interpretation: Sinus tachycardia LAD, consider LAFB or inferior infarct Anterior infarct, old Minimal ST elevation, lateral leads When compared to prior, similar tachycardia with less PVC and less wandering baseline. No STEMI Confirmed by Theda Belfast ([id number]) on 05/26/2021 5:51:56 PM  Radiology DG Chest Port 1 View  Result Date: 05/26/2021 CLINICAL DATA:  Sepsis, osteomyelitis EXAM: PORTABLE CHEST 1 VIEW COMPARISON:  04/06/2021 FINDINGS: Single frontal view of the chest demonstrates a stable cardiac silhouette. Chronic central vascular congestion without airspace disease, effusion, or pneumothorax. Azygos fissure again noted. No acute bony abnormalities. IMPRESSION: 1. Chronic central vascular congestion.  No acute airspace disease. Electronically Signed   By: Sharlet Salina M.D.   On: 05/26/2021 18:28   DG Foot Complete Left  Result Date: 05/26/2021 CLINICAL DATA:  Left foot bruising over second and third digits EXAM: LEFT FOOT - COMPLETE 3+ VIEW COMPARISON:  None. FINDINGS: Frontal, oblique, lateral views of the left foot are obtained. Evaluation was limited due to patient cooperation and difficulty positioning the patient. There is a minimally displaced intra-articular fracture involving the lateral aspect of the base of the first distal phalanx. I  do not see any other acute displaced fractures. Bones are osteopenic. Mild diffuse osteoarthritis. Soft tissue swelling of the forefoot. IMPRESSION: 1. Minimally displaced intra-articular avulsion type fracture at the lateral aspect base of the first distal phalanx. 2. Mild soft tissue swelling of the forefoot. 3. Diffuse osteoarthritis. Electronically Signed   By: Sharlet Salina M.D.   On: 05/26/2021 18:31   DG Foot Complete Right  Result Date: 05/26/2021 CLINICAL DATA:  Sepsis, osteomyelitis, erythema and swelling EXAM: RIGHT FOOT COMPLETE - 3+ VIEW COMPARISON:  05/12/2021 FINDINGS: Frontal, oblique, lateral views of the right foot are obtained. Previous amputations at the base of the fourth and fifth metatarsals. Prior third metatarsal fracture is again identified. Since the prior exam, there is increased prominence of the fracture line with increased angulation. There is overlying soft tissue swelling that has progressed significantly, with soft tissue ulceration and subcutaneous gas. Overall, findings are worrisome for chronic osteomyelitis. No acute displaced fracture.  Prominent calcaneal spurs again seen. IMPRESSION: 1. Progressive soft tissue swelling and soft tissue ulceration within the midfoot, with subcutaneous gas best seen on lateral view. There is increased prominence of the fracture line at the chronic third metatarsal fracture, with increased angulation since prior study. While repeat injury could give this appearance, the findings are highly concerning for chronic osteomyelitis given previous MRI finding 03/19/2021. Electronically Signed   By: Sharlet Salina M.D.   On: 05/26/2021 18:36    Procedures .Critical Care Performed by: Gailen Shelter, PA Authorized by: Gailen Shelter, PA   Critical care provider statement:    Critical care time (minutes):  35   Critical care time was exclusive of:  Separately billable procedures and treating other patients and teaching time   Critical  care was necessary to treat or prevent imminent or life-threatening deterioration of the following conditions:  Sepsis   Critical care was time spent personally by me on the following activities:  Discussions with consultants, evaluation of patient's response to treatment, examination of patient, review of old charts, re-evaluation of patient's condition, pulse oximetry, ordering and review of radiographic studies, ordering and review of laboratory studies and ordering and  performing treatments and interventions   I assumed direction of critical care for this patient from another provider in my specialty: no     Medications Ordered in ED Medications  vancomycin (VANCOREADY) IVPB 1500 mg/300 mL (has no administration in time range)  fentaNYL (SUBLIMAZE) injection 50 mcg (50 mcg Intravenous Given 05/26/21 1811)  ceFEPIme (MAXIPIME) 2 g in sodium chloride 0.9 % 100 mL IVPB (2 g Intravenous New Bag/Given 05/26/21 1823)  vancomycin (VANCOCIN) IVPB 1000 mg/200 mL premix (1,000 mg Intravenous New Bag/Given 05/26/21 1829)  acetaminophen (TYLENOL) tablet 650 mg (650 mg Oral Given 05/26/21 1635)  sodium chloride 0.9 % bolus 500 mL (500 mLs Intravenous New Bag/Given 05/26/21 1715)    ED Course  I have reviewed the triage vital signs and the nursing notes.  Pertinent labs & imaging results that were available during my care of the patient were reviewed by me and considered in my medical decision making (see chart for details).  Clinical Course as of 05/26/21 1924  Thu May 26, 2021  1728 Reviewed 03/04/21 ECHO  FINDINGS   Left Ventricle: Left ventricular ejection fraction, by estimation, is 60  to 65%. The left ventricle has normal function. The left ventricle has no  regional wall motion abnormalities. Definity contrast agent was given IV  to delineate the left ventricular   endocardial borders. The left ventricular internal cavity size was normal  in size. There is no left ventricular hypertrophy. Left  ventricular  diastolic parameters are consistent with Grade II diastolic dysfunction  (pseudonormalization). Elevated left  ventricular end-diastolic pressure.  [WF]  1728 Patient has some diastolic dysfunction is septic from right foot infection is not hypotensive lactic pending at this time we will give 500 mL bolus of fluids begin thank empirically and patient was given Tylenol already. [WF]  1728 Code sepsis was initiated when I first assessed patient [WF]  1744 Discussed with Dr. Mikey Bussing She began taking care of pt 3 weeks ago and prior to that she was seen by duda who recommended amputation for osteo so started on doxycycline. Has not been on abx for some time.   Xray w wound care didn't show osteo and then started to have redness swelling etc and was started back on doxy today.  [WF]  1804 WBC(!): 20.9 [WF]  1804 Lactic Acid, Venous(!!): 3.3 Receiving fluids [WF]  1836 Chest x-ray without infiltrate doubt pneumonia as cause of patient's sepsis [WF]  1837 Left foot  w distal 1st phalanx fracture (avulsion) IMPRESSION: 1. Minimally displaced intra-articular avulsion type fracture at the lateral aspect base of the first distal phalanx. 2. Mild soft tissue swelling of the forefoot. 3. Diffuse osteoarthritis. [WF]  1922 Discussed with Dr. Toniann Fail of hospitalist.  Will admit to [facility].  Orthopedics will need to be made aware of this patient once admitted and at [facility]. Dr. Lajoyce Corners is familiar with pt.  [WF]    Clinical Course User Index [WF] Gailen Shelter, [place]   MDM Rules/Calculators/A&P                          Patient is 59 year old diabetic female history of osteomyelitis of the right foot she presents to the ER today septic she has a known source of the right foot she has a wound I discussed with her wound care doctor discussion is documented above.  She is febrile, tachycardic, tachypneic however blood pressures are within normal but she has mildly elevated lactic acid.  She has a history of diastolic heart failure we will provide her with gentle hydration here in the ER.  Also given antipyretics and scheduled.  Vancomycin cefepime after blood cultures  8:03 PM discussed with on-call orthopedic for [facility] He will see patient as soon as she is transferred to [facility]. Discussed with Dr. Rush Landmark prior to leaving at shift change he is aware of patient and aware of need for ongoing monitoring.  Final Clinical Impression(s) / ED Diagnoses Final diagnoses:  Cellulitis of right foot  Sepsis without acute organ dysfunction, due to unspecified organism [facility])  Osteomyelitis of right foot, unspecified type [facility])    Rx / DC Orders ED Discharge Orders     None        Gailen Shelter, [place] 05/26/21 2004    Tegeler, Canary Brim, MD 05/26/21 2352

## 2021-05-26 NOTE — ED Triage Notes (Signed)
Wound to her right foot. She has been going to the wound clinic for 6 weeks with no improvement. States has been missing her appointments.
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
## 2021-05-26 NOTE — ED Notes (Signed)
Pt states she did not take her gabapentin and feels she is getting jumpy
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
## 2021-05-26 NOTE — Progress Notes (Signed)
[name] ([id number]) Visit Report for 05/24/2021 HBO Details Patient Name: Date of Service: [name], [name] [name] 05/24/2021 8:00 A M Medical Record Number: [id number] Patient Account Number: [id number] Date of Birth/Sex: Treating RN: 05/09/1961 (60 y.o. [name], [name] Primary Care Ervin Hensley: [name] Other Clinician: Haywood Pao Referring Elouise Divelbiss: Treating Aniketh Huberty/Extender: Roselind Rily in Treatment: 6 HBO Treatment Course Details Treatment Course Number: 1 Ordering Alphonsus Doyel: Geralyn Corwin T Treatments Ordered: otal 40 HBO Treatment Start Date: 05/09/2021 HBO Indication: Diabetic Ulcer(s) of the Lower Extremity HBO Treatment Details Treatment Number: Treatment Not Started: Patient Choice Patient Type: Outpatient Chamber Type: Monoplace Chamber Serial #: [id number] Treatment Protocol: 2.0 ATA with 90 minutes oxygen, and no air breaks Treatment Details Air breaks and breathing Decompress Decompress Compress Tx Pressure Begins Reached periods Begins Ends (leave unused spaces blank) Chamber Pressure (ATA 1 2 ------2 1 ) Clock Time (24 hr) - - ------- - Treatment Length: (minutes) Treatment Segments: Vital Signs Capillary Blood Glucose Reference Range: 80 - 120 mg / dl HBO Diabetic Blood Glucose Intervention Range: <131 mg/dl or >341 mg/dl Type: Time Vitals Blood Pulse: Respiratory Capillary Blood Glucose Pulse Action Temperature: Taken: Pressure: Rate: Glucose (mg/dl): Meter #: Oximetry (%) Taken: Pre 07:44 155/79 80 20 98.3 105 Given 8 oz. Glucerna Pre 08:22 114 Given 8 oz Glucerna Pre 08:58 135 Cleared for treatment Treatment Response Treatment Completion Status: Treatment Not Started Reason: Patient Choice Additional Procedure Documentation Tissue Sevierity: Fat layer exposed [name] Notes Patient was prepared to proceed with treatment and stated she had to use the restroom. After restroom break, while  inquiring about scheduling needs, patient changed into her clothing and it was decided to attempt treatment on Thursday. Physician HBO Attestation: I certify that I supervised this HBO treatment in accordance with Medicare guidelines. A trained emergency response team is readily available per Yes hospital policies and procedures. Continue HBOT as ordered. Yes Electronic Signature(s) Signed: 05/24/2021 1:52:59 PM By: Geralyn Corwin DO Entered By: Geralyn Corwin on 05/24/2021 13:52:40 -------------------------------------------------------------------------------- HBO Safety Checklist Details Patient Name: Date of Service: [name] 05/24/2021 8:00 A M Medical Record Number: [id number] Patient Account Number: [id number] Date of Birth/Sex: Treating RN: Jun 25, 1961 (60 y.o. [name], [name] Primary Care Jonael Paradiso: [name] Other Clinician: Haywood Pao Referring Janus Vlcek: Treating Windell Musson/Extender: Roselind Rily in Treatment: 6 HBO Safety Checklist Items Safety Checklist Consent Form Signed Patient voided / foley secured and emptied When did you last eato 2300 Yesterday Last dose of injectable or oral agent 2300 Yesterday Ostomy pouch emptied and vented if applicable NA All implantable devices assessed, documented and approved NA Intravenous access site secured and place NA Valuables secured Linens and cotton and cotton/polyester blend (less than 51% polyester) Personal oil-based products / skin lotions / body lotions removed Wigs or hairpieces removed NA Smoking or tobacco materials removed NA Books / newspapers / magazines / loose paper removed Cologne, aftershave, perfume and deodorant removed Jewelry removed (may wrap wedding band) Make-up removed Hair care products removed Battery operated devices (external) removed Heating patches and chemical warmers removed Titanium eyewear removed NA Nail polish cured greater  than 10 hours NA Casting material cured greater than 10 hours NA Hearing aids removed NA Loose dentures or partials removed NA Prosthetics have been removed NA Patient demonstrates correct use of air break device (if applicable) Patient concerns have been addressed Patient grounding bracelet on and cord attached to chamber Specifics for Inpatients (complete in addition to above) Medication  sheet sent with patient NA Intravenous medications needed or due during therapy sent with patient NA Drainage tubes (e.g. nasogastric tube or chest tube secured and vented) NA Endotracheal or Tracheotomy tube secured NA Cuff deflated of air and inflated with saline NA Airway suctioned NA Electronic Signature(s) Signed: 05/26/2021 6:51:22 PM By: Haywood Pao EMT Entered By: Haywood Pao on 05/24/2021 11:20:09

## 2021-05-26 NOTE — Progress Notes (Signed)
Pharmacy Antibiotic Note  [name] is a 60 y.o. female admitted on 05/26/2021 with  wound infection and possible osteo of the foot.  Pharmacy has been consulted for vancomycin dosing.  WBC 20.9, febrile - Tmax 102.48F SCr 1.05 - baseline ~0.8-0.97  Plan: Start cefepime 2 gm every 8 hours Give vancomycin 2000 mg IV x1 Vancomycin 1500 mg IV every 24 hours.  Goal trough 15-20 mcg/mL.    > eAUC 474, SCr used 1.05, Cmax 31.4 Monitor renal function, CBC, cultures/sensitivities, LOT and de-escalate as able Vancomycin levels at steady state  Temp (24hrs), Avg:102.9 F (39.4 C), Min:102.9 F (39.4 C), Max:102.9 F (39.4 C)  Recent Labs  Lab 05/26/21 1700  WBC 20.9*  CREATININE 1.05*  LATICACIDVEN 3.3*    Estimated Creatinine Clearance: 81.9 mL/min (A) (by C-G formula based on SCr of 1.05 mg/dL (H)).    Allergies  Allergen Reactions   Fish Allergy Anaphylaxis    INCLUDES OMEGA 3 OILS   Fish Oil Anaphylaxis and Swelling    THROAT SWELLS INCLUDES FISH AS A CLASS   Omega-3 Fatty Acids Swelling    Throat swelling     Crestor [Rosuvastatin] Other (See Comments)    Extreme joint pain, to this & other statins   Gabapentin Anxiety    Anxiety on high doses   Lovastatin Other (See Comments)    EXTREME JOINT PAIN   Metronidazole Nausea Only and Swelling    Headache and shakes Nausea and vomiting   Red Yeast Rice [Cholestin] Other (See Comments)    Muscle pain and severe joint pain.    Rotigotine Swelling    Extreme edema    Atrovent Hfa [Ipratropium Bromide Hfa] Other (See Comments)    wheezing   Red Yeast Rice Extract     Joint pain   Victoza [Liraglutide]     Unsure of reaction    Enalapril Cough    Cough Cough   Suprep [Na Sulfate-K Sulfate-Mg Sulf] Nausea And Vomiting   Tape Other (See Comments)    Electrodes causes skin breakdown    Antimicrobials this admission: Vancomycin 9/15 >> Cefepime 9/15 >>  Dose adjustments this admission: none  Microbiology  results: 9/15 BCx: pending  9/15 UCx:  pending  Thank you for allowing pharmacy to be a part of this patient's care.  Filbert Schilder, PharmD PGY1 Pharmacy Resident 05/26/2021  5:40 PM  Please check [email or web address] for unit-specific pharmacy phone numbers.

## 2021-05-26 NOTE — Progress Notes (Signed)
[name], [name] ([id number]) Visit Report for 05/24/2021 Arrival Information Details Patient Name: Date of Service: [name], [name] 05/24/2021 8:00 A M Medical Record Number: [id number] Patient Account Number: [id number] Date of Birth/Sex: Treating RN: August 04, 1961 (60 y.o. [name], [name] Primary Care [name]: [name] Other Clinician: Haywood [name] Referring [name]: Treating [name]/Extender: [name] in Treatment: 6 Visit Information History Since Last Visit All ordered tests and consults were completed: Yes Patient Arrived: Ambulatory Added or deleted any medications: No Arrival Time: 07:30 Any new allergies or adverse reactions: No Accompanied By: spouse Had a fall or experienced change in No Transfer Assistance: None activities of daily living that may affect Patient Identification Verified: Yes risk of falls: Secondary Verification Process Completed: Yes Signs or symptoms of abuse/neglect since last visito No Patient Requires Transmission-Based Precautions: No Hospitalized since last visit: No Patient Has Alerts: Yes Implantable device outside of the clinic excluding No Patient Alerts: [facility] 03/2021 cellular tissue based products placed in the center ABI: 1.24 since last visit: Pain Present Now: Yes Electronic Signature(s) Signed: 05/26/2021 6:51:22 PM By: [name] EMT Entered By: [name] on 05/24/2021 10:34:27 -------------------------------------------------------------------------------- Vitals Details Patient Name: Date of Service: [name], [name] 05/24/2021 8:00 A M Medical Record Number: [id number] Patient Account Number: [id number] Date of Birth/Sex: Treating RN: 02-23-1961 (60 y.o. [name], [name] Primary Care [name]: [name] Other Clinician: Haywood [name] Referring [name]: Treating [name]/Extender: [name] in Treatment:  6 Vital Signs Time Taken: 07:44 Temperature (F): 98.3 Height (in): 62 Pulse (bpm): 80 Weight (lbs): 323 Respiratory Rate (breaths/min): 20 Body Mass Index (BMI): 59.1 Blood Pressure (mmHg): 155/79 Reference Range: 80 - 120 mg / dl Electronic Signature(s) Signed: 05/26/2021 6:51:22 PM By: [name] EMT Entered By: [name] on 05/24/2021 11:17:42

## 2021-05-26 NOTE — Progress Notes (Signed)
[name] [name] ([id number]) Visit Report for 05/26/2021 Arrival Information Details Patient Name: Date of Service: [name] [name] [name] 05/26/2021 8:00 A M Medical Record Number: [id number] Patient Account Number: [id number] Date of Birth/Sex: Treating RN: 04-01-1961 (60 y.o. [name], Yvonne Kendall Primary Care Tremar Wickens: [name] Other Clinician: Haywood Pao Referring Antwoin Lackey: Treating Juventino Pavone/Extender: Lynnell Catalan in Treatment: 7 Visit Information History Since Last Visit All ordered tests and consults were completed: Yes Patient Arrived: Ambulatory Added or deleted any medications: No Arrival Time: 08:10 Any new allergies or adverse reactions: No Accompanied By: spouse Had a fall or experienced change in No Transfer Assistance: None activities of daily living that may affect Patient Identification Verified: Yes risk of falls: Secondary Verification Process Completed: Yes Signs or symptoms of abuse/neglect since last visito No Patient Requires Transmission-Based Precautions: No Hospitalized since last visit: No Patient Has Alerts: Yes Implantable device outside of the clinic excluding No Patient Alerts: [facility] 03/2021 cellular tissue based products placed in the center ABI: 1.24 since last visit: Pain Present Now: No Electronic Signature(s) Signed: 05/26/2021 6:51:22 PM By: Haywood Pao EMT Entered By: Haywood Pao on 05/26/2021 12:47:45 -------------------------------------------------------------------------------- Encounter Discharge Information Details Patient Name: Date of Service: [name] [name] [name] 05/26/2021 8:00 A M Medical Record Number: [id number] Patient Account Number: [id number] Date of Birth/Sex: Treating RN: [birth year] (60 y.o. Arta Silence Primary Care Iliyana Convey: [name] Other Clinician: Haywood Pao Referring Kolden Dupee: Treating Ailie Gage/Extender: Lynnell Catalan in Treatment: 7 Encounter Discharge Information Items Discharge Condition: Stable Ambulatory Status: Wheelchair Discharge Destination: Other (Note Required) Transportation: Other Accompanied By: spouse Schedule Follow-up Appointment: No Clinical Summary of Care: Notes Patient has wound encounter this morning at 1015 Electronic Signature(s) Signed: 05/26/2021 6:51:22 PM By: Haywood Pao EMT Entered By: Haywood Pao on 05/26/2021 13:15:51 -------------------------------------------------------------------------------- Vitals Details Patient Name: Date of Service: [name] [name] [name] 05/26/2021 8:00 A M Medical Record Number: [id number] Patient Account Number: [id number] Date of Birth/Sex: Treating RN: 08/27/1961 (60 y.o. [name], [name] Primary Care Lemond Griffee: [name] Other Clinician: Haywood Pao Referring Marq Rebello: Treating Samie Reasons/Extender: Lynnell Catalan in Treatment: 7 Vital Signs Time Taken: 08:28 Temperature (F): 98.2 Height (in): 62 Pulse (bpm): 92 Weight (lbs): 323 Respiratory Rate (breaths/min): 16 Body Mass Index (BMI): 59.1 Blood Pressure (mmHg): 183/75 Capillary Blood Glucose (mg/dl): 967 Reference Range: 80 - 120 mg / dl Electronic Signature(s) Signed: 05/26/2021 6:51:22 PM By: Haywood Pao EMT Entered By: Haywood Pao on 05/26/2021 12:48:25

## 2021-05-27 ENCOUNTER — Encounter (HOSPITAL_COMMUNITY): Payer: Self-pay | Admitting: Internal Medicine

## 2021-05-27 ENCOUNTER — Encounter (HOSPITAL_BASED_OUTPATIENT_CLINIC_OR_DEPARTMENT_OTHER): Payer: 59 | Admitting: Internal Medicine

## 2021-05-27 DIAGNOSIS — E039 Hypothyroidism, unspecified: Secondary | ICD-10-CM | POA: Diagnosis present

## 2021-05-27 DIAGNOSIS — G8918 Other acute postprocedural pain: Secondary | ICD-10-CM | POA: Diagnosis not present

## 2021-05-27 DIAGNOSIS — M86671 Other chronic osteomyelitis, right ankle and foot: Secondary | ICD-10-CM | POA: Diagnosis present

## 2021-05-27 DIAGNOSIS — M069 Rheumatoid arthritis, unspecified: Secondary | ICD-10-CM | POA: Diagnosis present

## 2021-05-27 DIAGNOSIS — E785 Hyperlipidemia, unspecified: Secondary | ICD-10-CM | POA: Diagnosis present

## 2021-05-27 DIAGNOSIS — L03115 Cellulitis of right lower limb: Secondary | ICD-10-CM | POA: Diagnosis present

## 2021-05-27 DIAGNOSIS — L0390 Cellulitis, unspecified: Secondary | ICD-10-CM

## 2021-05-27 DIAGNOSIS — S88111A Complete traumatic amputation at level between knee and ankle, right lower leg, initial encounter: Secondary | ICD-10-CM | POA: Diagnosis not present

## 2021-05-27 DIAGNOSIS — J4520 Mild intermittent asthma, uncomplicated: Secondary | ICD-10-CM | POA: Diagnosis present

## 2021-05-27 DIAGNOSIS — G546 Phantom limb syndrome with pain: Secondary | ICD-10-CM | POA: Diagnosis not present

## 2021-05-27 DIAGNOSIS — K760 Fatty (change of) liver, not elsewhere classified: Secondary | ICD-10-CM | POA: Diagnosis present

## 2021-05-27 DIAGNOSIS — I350 Nonrheumatic aortic (valve) stenosis: Secondary | ICD-10-CM | POA: Diagnosis present

## 2021-05-27 DIAGNOSIS — M3500 Sicca syndrome, unspecified: Secondary | ICD-10-CM | POA: Diagnosis present

## 2021-05-27 DIAGNOSIS — I2510 Atherosclerotic heart disease of native coronary artery without angina pectoris: Secondary | ICD-10-CM | POA: Diagnosis not present

## 2021-05-27 DIAGNOSIS — G894 Chronic pain syndrome: Secondary | ICD-10-CM | POA: Diagnosis present

## 2021-05-27 DIAGNOSIS — E11621 Type 2 diabetes mellitus with foot ulcer: Secondary | ICD-10-CM | POA: Diagnosis present

## 2021-05-27 DIAGNOSIS — B952 Enterococcus as the cause of diseases classified elsewhere: Secondary | ICD-10-CM | POA: Diagnosis not present

## 2021-05-27 DIAGNOSIS — I10 Essential (primary) hypertension: Secondary | ICD-10-CM | POA: Diagnosis not present

## 2021-05-27 DIAGNOSIS — E669 Obesity, unspecified: Secondary | ICD-10-CM | POA: Diagnosis not present

## 2021-05-27 DIAGNOSIS — Z20822 Contact with and (suspected) exposure to covid-19: Secondary | ICD-10-CM | POA: Diagnosis present

## 2021-05-27 DIAGNOSIS — I110 Hypertensive heart disease with heart failure: Secondary | ICD-10-CM | POA: Diagnosis present

## 2021-05-27 DIAGNOSIS — M86271 Subacute osteomyelitis, right ankle and foot: Secondary | ICD-10-CM | POA: Diagnosis not present

## 2021-05-27 DIAGNOSIS — S88111D Complete traumatic amputation at level between knee and ankle, right lower leg, subsequent encounter: Secondary | ICD-10-CM | POA: Diagnosis not present

## 2021-05-27 DIAGNOSIS — Z6841 Body Mass Index (BMI) 40.0 and over, adult: Secondary | ICD-10-CM | POA: Diagnosis not present

## 2021-05-27 DIAGNOSIS — A4181 Sepsis due to Enterococcus: Secondary | ICD-10-CM | POA: Diagnosis present

## 2021-05-27 DIAGNOSIS — E1140 Type 2 diabetes mellitus with diabetic neuropathy, unspecified: Secondary | ICD-10-CM | POA: Diagnosis present

## 2021-05-27 DIAGNOSIS — M351 Other overlap syndromes: Secondary | ICD-10-CM | POA: Diagnosis present

## 2021-05-27 DIAGNOSIS — A419 Sepsis, unspecified organism: Secondary | ICD-10-CM | POA: Diagnosis not present

## 2021-05-27 DIAGNOSIS — M869 Osteomyelitis, unspecified: Secondary | ICD-10-CM | POA: Diagnosis not present

## 2021-05-27 DIAGNOSIS — R7881 Bacteremia: Secondary | ICD-10-CM | POA: Diagnosis not present

## 2021-05-27 DIAGNOSIS — I5042 Chronic combined systolic (congestive) and diastolic (congestive) heart failure: Secondary | ICD-10-CM | POA: Diagnosis present

## 2021-05-27 DIAGNOSIS — G2581 Restless legs syndrome: Secondary | ICD-10-CM | POA: Diagnosis present

## 2021-05-27 DIAGNOSIS — I739 Peripheral vascular disease, unspecified: Secondary | ICD-10-CM | POA: Diagnosis not present

## 2021-05-27 DIAGNOSIS — D62 Acute posthemorrhagic anemia: Secondary | ICD-10-CM | POA: Diagnosis not present

## 2021-05-27 DIAGNOSIS — E1169 Type 2 diabetes mellitus with other specified complication: Secondary | ICD-10-CM | POA: Diagnosis present

## 2021-05-27 DIAGNOSIS — Z4781 Encounter for orthopedic aftercare following surgical amputation: Secondary | ICD-10-CM | POA: Diagnosis not present

## 2021-05-27 DIAGNOSIS — L97519 Non-pressure chronic ulcer of other part of right foot with unspecified severity: Secondary | ICD-10-CM | POA: Diagnosis present

## 2021-05-27 LAB — BLOOD CULTURE ID PANEL (REFLEXED) - BCID2

## 2021-05-27 LAB — BASIC METABOLIC PANEL
Anion gap: 9 (ref 5–15)
BUN: 9 mg/dL (ref 6–20)
CO2: 26 mmol/L (ref 22–32)
Calcium: 8.7 mg/dL — ABNORMAL LOW (ref 8.9–10.3)
Chloride: 101 mmol/L (ref 98–111)
Creatinine, Ser: 0.9 mg/dL (ref 0.44–1.00)
GFR, Estimated: >60 mL/min (ref >60)
Glucose, Bld: 232 mg/dL — ABNORMAL HIGH (ref 70–99)
Potassium: 3.7 mmol/L (ref 3.5–5.1)
Sodium: 136 mmol/L (ref 135–145)

## 2021-05-27 LAB — CBC
HCT: 36.6 % (ref 36.0–46.0)
Hemoglobin: 11.4 g/dL — ABNORMAL LOW (ref 12.0–15.0)
MCH: 25.6 pg — ABNORMAL LOW (ref 26.0–34.0)
MCHC: 31.1 g/dL (ref 30.0–36.0)
MCV: 82.2 fL (ref 80.0–100.0)
Platelets: 230 10*3/uL (ref 150–400)
RBC: 4.45 MIL/uL (ref 3.87–5.11)
RDW: 17.4 % — ABNORMAL HIGH (ref 11.5–15.5)
WBC: 11.1 10*3/uL — ABNORMAL HIGH (ref 4.0–10.5)
nRBC: 0 % (ref 0.0–0.2)

## 2021-05-27 LAB — GLUCOSE, CAPILLARY
Glucose-Capillary: 211 mg/dL — ABNORMAL HIGH (ref 70–99)
Glucose-Capillary: 271 mg/dL — ABNORMAL HIGH (ref 70–99)

## 2021-05-27 LAB — HEMOGLOBIN A1C
Hgb A1c MFr Bld: 7.3 % — ABNORMAL HIGH (ref 4.8–5.6)
Mean Plasma Glucose: 162.81 mg/dL

## 2021-05-27 LAB — LACTIC ACID, PLASMA: Lactic Acid, Venous: 2.1 mmol/L (ref 0.5–1.9)

## 2021-05-27 LAB — CBG MONITORING, ED: Glucose-Capillary: 237 mg/dL — ABNORMAL HIGH (ref 70–99)

## 2021-05-27 MED ORDER — VANCOMYCIN HCL 1750 MG/350ML IV SOLN
1750.0000 mg | INTRAVENOUS | Status: DC
  Administered 2021-05-27: 1750 mg via INTRAVENOUS
  Filled 2021-05-27 (×2): qty 350

## 2021-05-27 MED ORDER — ISOSORBIDE MONONITRATE ER 30 MG PO TB24
30.0000 mg | ORAL_TABLET | Freq: Every day | ORAL | Status: DC
  Administered 2021-05-27 – 2021-06-08 (×13): 30 mg via ORAL
  Filled 2021-05-27 (×13): qty 1

## 2021-05-27 MED ORDER — LORATADINE 10 MG PO TABS
10.0000 mg | ORAL_TABLET | Freq: Every day | ORAL | Status: DC
  Administered 2021-05-27 – 2021-06-08 (×13): 10 mg via ORAL
  Filled 2021-05-27 (×13): qty 1

## 2021-05-27 MED ORDER — PANTOPRAZOLE SODIUM 40 MG PO TBEC: 40.0000 mg | DELAYED_RELEASE_TABLET | Freq: Every day | ORAL | Status: DC | PRN

## 2021-05-27 MED ORDER — FLUTICASONE PROPIONATE 50 MCG/ACT NA SUSP: 2.0000 | Freq: Every day | NASAL | Status: DC | PRN

## 2021-05-27 MED ORDER — HYDROXYCHLOROQUINE SULFATE 200 MG PO TABS
200.0000 mg | ORAL_TABLET | Freq: Two times a day (BID) | ORAL | Status: DC
  Administered 2021-05-27 – 2021-06-08 (×25): 200 mg via ORAL
  Filled 2021-05-27 (×25): qty 1

## 2021-05-27 MED ORDER — IRBESARTAN 300 MG PO TABS
150.0000 mg | ORAL_TABLET | Freq: Every day | ORAL | Status: DC
  Administered 2021-05-27 – 2021-06-08 (×13): 150 mg via ORAL
  Filled 2021-05-27 (×13): qty 1

## 2021-05-27 MED ORDER — LEVOCETIRIZINE DIHYDROCHLORIDE 5 MG PO TABS: 5.0000 mg | ORAL_TABLET | Freq: Every evening | ORAL | Status: DC

## 2021-05-27 MED ORDER — ENOXAPARIN SODIUM 80 MG/0.8ML IJ SOSY
0.5000 mg/kg | PREFILLED_SYRINGE | Freq: Every day | INTRAMUSCULAR | Status: DC
  Administered 2021-05-27 – 2021-06-08 (×13): 75 mg via SUBCUTANEOUS
  Filled 2021-05-27 (×13): qty 0.8

## 2021-05-27 MED ORDER — MUPIROCIN 2 % EX OINT
TOPICAL_OINTMENT | Freq: Three times a day (TID) | CUTANEOUS | Status: DC
  Administered 2021-05-29 – 2021-06-05 (×2): 1 via TOPICAL
  Filled 2021-05-27 (×3): qty 22

## 2021-05-27 MED ORDER — ACETAMINOPHEN 650 MG RE SUPP: 650.0000 mg | Freq: Four times a day (QID) | RECTAL | Status: DC | PRN

## 2021-05-27 MED ORDER — VITAMIN D (ERGOCALCIFEROL) 1.25 MG (50000 UNIT) PO CAPS
50000.0000 [IU] | ORAL_CAPSULE | ORAL | Status: DC
  Administered 2021-05-27 – 2021-06-03 (×2): 50000 [IU] via ORAL
  Filled 2021-05-27 (×2): qty 1

## 2021-05-27 MED ORDER — EZETIMIBE 10 MG PO TABS
10.0000 mg | ORAL_TABLET | Freq: Every day | ORAL | Status: DC
  Administered 2021-05-28 – 2021-06-08 (×12): 10 mg via ORAL
  Filled 2021-05-27 (×12): qty 1

## 2021-05-27 MED ORDER — SODIUM CHLORIDE 0.9 % IV SOLN
2.0000 g | Freq: Three times a day (TID) | INTRAVENOUS | Status: DC
  Administered 2021-05-27 – 2021-05-28 (×4): 2 g via INTRAVENOUS
  Filled 2021-05-27 (×4): qty 2

## 2021-05-27 MED ORDER — ACETAMINOPHEN 325 MG PO TABS
650.0000 mg | ORAL_TABLET | Freq: Four times a day (QID) | ORAL | Status: DC | PRN
  Filled 2021-05-27: qty 2

## 2021-05-27 MED ORDER — VANCOMYCIN HCL IN DEXTROSE 1-5 GM/200ML-% IV SOLN
1000.0000 mg | Freq: Once | INTRAVENOUS | Status: AC
  Administered 2021-05-27: 1000 mg via INTRAVENOUS
  Filled 2021-05-27: qty 200

## 2021-05-27 MED ORDER — MAGNESIUM OXIDE 400 MG PO TABS
400.0000 mg | ORAL_TABLET | Freq: Two times a day (BID) | ORAL | Status: DC | PRN
  Filled 2021-05-27: qty 1

## 2021-05-27 MED ORDER — CYCLOBENZAPRINE HCL 5 MG PO TABS
5.0000 mg | ORAL_TABLET | Freq: Three times a day (TID) | ORAL | Status: DC | PRN
  Administered 2021-05-27 – 2021-05-28 (×3): 10 mg via ORAL
  Administered 2021-05-29: 5 mg via ORAL
  Administered 2021-05-29 – 2021-06-04 (×13): 10 mg via ORAL
  Filled 2021-05-27 (×18): qty 2

## 2021-05-27 MED ORDER — NITROGLYCERIN 0.4 MG SL SUBL: 0.4000 mg | SUBLINGUAL_TABLET | SUBLINGUAL | Status: DC | PRN

## 2021-05-27 MED ORDER — GABAPENTIN 400 MG PO CAPS
400.0000 mg | ORAL_CAPSULE | Freq: Two times a day (BID) | ORAL | Status: DC
  Administered 2021-05-28 – 2021-06-08 (×24): 400 mg via ORAL
  Filled 2021-05-27 (×24): qty 1

## 2021-05-27 MED ORDER — MORPHINE SULFATE (PF) 4 MG/ML IV SOLN
4.0000 mg | Freq: Once | INTRAVENOUS | Status: AC
  Administered 2021-05-27: 4 mg via INTRAVENOUS
  Filled 2021-05-27: qty 1

## 2021-05-27 MED ORDER — ALBUTEROL SULFATE (2.5 MG/3ML) 0.083% IN NEBU
2.5000 mg | INHALATION_SOLUTION | RESPIRATORY_TRACT | Status: DC | PRN
  Administered 2021-05-27 – 2021-06-06 (×3): 2.5 mg via RESPIRATORY_TRACT
  Filled 2021-05-27 (×3): qty 3

## 2021-05-27 MED ORDER — OXYCODONE HCL 5 MG PO TABS
10.0000 mg | ORAL_TABLET | Freq: Four times a day (QID) | ORAL | Status: DC | PRN
  Administered 2021-05-27 – 2021-06-02 (×19): 10 mg via ORAL
  Filled 2021-05-27 (×20): qty 2

## 2021-05-27 MED ORDER — PRAMIPEXOLE DIHYDROCHLORIDE 0.25 MG PO TABS
0.5000 mg | ORAL_TABLET | ORAL | Status: DC | PRN
  Administered 2021-05-27 – 2021-06-05 (×7): 0.5 mg via ORAL
  Filled 2021-05-27 (×12): qty 2

## 2021-05-27 MED ORDER — INSULIN ASPART 100 UNIT/ML IJ SOLN
0.0000 [IU] | Freq: Three times a day (TID) | INTRAMUSCULAR | Status: DC
  Administered 2021-05-28: 8 [IU] via SUBCUTANEOUS

## 2021-05-27 MED ORDER — PROMETHAZINE HCL 25 MG PO TABS
25.0000 mg | ORAL_TABLET | Freq: Three times a day (TID) | ORAL | Status: DC | PRN
  Administered 2021-06-03 – 2021-06-04 (×2): 25 mg via ORAL
  Filled 2021-05-27 (×2): qty 1

## 2021-05-27 MED ORDER — INSULIN ASPART 100 UNIT/ML IJ SOLN
0.0000 [IU] | INTRAMUSCULAR | Status: DC
  Administered 2021-05-27: 7 [IU] via SUBCUTANEOUS

## 2021-05-27 MED ORDER — BEMPEDOIC ACID-EZETIMIBE 180-10 MG PO TABS: 1.0000 | ORAL_TABLET | Freq: Every day | ORAL | Status: DC

## 2021-05-27 MED ORDER — GABAPENTIN 400 MG PO CAPS: 1600.0000 mg | ORAL_CAPSULE | ORAL | Status: DC

## 2021-05-27 MED ORDER — DILTIAZEM HCL ER COATED BEADS 240 MG PO CP24
240.0000 mg | ORAL_CAPSULE | Freq: Every day | ORAL | Status: DC
  Administered 2021-05-27 – 2021-06-08 (×13): 240 mg via ORAL
  Filled 2021-05-27 (×14): qty 1

## 2021-05-27 MED ORDER — GABAPENTIN 400 MG PO CAPS
800.0000 mg | ORAL_CAPSULE | Freq: Every day | ORAL | Status: DC
  Administered 2021-05-27 – 2021-06-08 (×13): 800 mg via ORAL
  Filled 2021-05-27 (×5): qty 2
  Filled 2021-05-27: qty 8
  Filled 2021-05-27 (×7): qty 2

## 2021-05-27 MED ORDER — LEVOTHYROXINE SODIUM 100 MCG PO TABS
200.0000 ug | ORAL_TABLET | Freq: Every day | ORAL | Status: DC
  Administered 2021-05-28 – 2021-06-08 (×12): 200 ug via ORAL
  Filled 2021-05-27 (×12): qty 2

## 2021-05-27 NOTE — Progress Notes (Signed)
Brief id note; full consult tomorrow    Bcx e faecalis by bcid on 9/15 Patient here for worsening diabetic foot infection, has been refusing amputation int he past but is thnking about it On 6 week doxycycline recently    Currently on vanc/cefepime   -continue current abx -repeat bcx ordered for tomorrow -would get tte  -dr Earlene Plater to see tomorrow

## 2021-05-27 NOTE — Progress Notes (Signed)
Pharmacy Antibiotic Note  [name] is a 60 y.o. female admitted on 05/26/2021 with  wound infection and possible osteo of the foot.  Pharmacy has been consulted for vancomycin dosing. CT shows cellulitis and chronic osteo. Pt is febrile with Tmax 102.9 and WBC is elevated at 11.1. SCr down slightly to 0.9. Lactic acid remains elevated. Pt did not receive her full loading dose of vancomycin last night.   Plan: Continue cefepime 2gm IV Q8H Give vancomycin 1gm IV x 1 now then start maintenance dose tonight, 1750mg  IV Q24H F/u renal fxn, C&S, clinical status and peak/trough at SS  Temp (24hrs), Avg:100.5 F (38.1 C), Min:98.1 F (36.7 C), Max:102.9 F (39.4 C)  Recent Labs  Lab 05/26/21 1700 05/27/21 0643 05/27/21 0645  WBC 20.9*  --  11.1*  CREATININE 1.05*  --  0.90  LATICACIDVEN 3.3* 2.1*  --      Estimated Creatinine Clearance: 95.5 mL/min (by C-G formula based on SCr of 0.9 mg/dL).    Antimicrobials this admission: Vancomycin 9/15 >> Cefepime 9/15 >>  Dose adjustments this admission: none  Microbiology results: 9/15 BCx: pending  9/15 UCx:  pending  Thank you for allowing pharmacy to be a part of this patient's care.  10/15, PharmD, BCPS Clinical Pharmacist Please see AMION for all pharmacy numbers 05/27/2021 8:16 AM

## 2021-05-27 NOTE — Plan of Care (Signed)

## 2021-05-27 NOTE — ED Notes (Signed)
Leaving with [facility].

## 2021-05-27 NOTE — H&P (Signed)
History and Physical    [name] KYH:[id number] DOB: May 28, 1961 DOA: 05/26/2021  PCP: [name] Pal, DO Consultants:  ortho: [name], rheum:     wound care: [name] cardiology: [name] Patient coming from:  Home - lives with husband   Chief Complaint: fever/foot infection   HPI: [name] is a 60 y.o. female with medical history significant of chronic pain, type 2 diabetes, CAD status post percutaneous coronary angioplasty, hyperlipidemia, mixed connective tissue disease, hypothyroidism mild intermittent asthma, GERD, chronic right foot ulcer who presented to ED on 05/26/2021 with fever to 104 and concerns for sepsis due to foot infection.  She states she has been septic 10 times in her life and with a high fever had concerns for this so that is why she came into the emergency room.  She has a chronic wound infection on her right dorsal foot 3 months that is followed by [name]  Previously had her right fourth and fifth toes amputated for limb salvage intervention. 6 weeks ago he told her that he wanted to do a BKA and she was not ready for this so has been to the hyperbaric chamber for a total of 4 times.  Thought it was getting better and does not feel like she has had a full chance to complete this therapy.  There is been no change in pain or drainage from the wound site, but that the redness/cellulitis has worsened.   She denies any headaches dizziness or lightheadedness, no vision changes chest pain or palpitations.  She denies any vomiting or diarrhea has chronic nausea but this is at her baseline.  She denies any dysuria urinary urgency or frequency. She has had a cough since this weekend and her fever started yesterday which prompted her ER visit.  She has had chronic bilateral leg cellulitis that waxes and wanes in severity.  She states about a month ago it started to get worse with increasing in redness and vesicle lesions.    ED Course: vitals: afebrile,  bp: 162/66, HR: 104, RR 24, oxygen: 94% RA Pertinent labs: wbc: 20.9-->11.1, hgb: 11.4, lactic acid: 3.3-->2.1, creatinine: 1.05 Chest x-ray with chronic central vascular congestion without airspace disease. Right foot x-ray shows progressive soft tissue swelling and soft tissue ulceration within the midfoot, with subcutaneous gas.  CT of right foot shows marked soft tissue swelling of the right foot consistent with cellulitis.  Evidence of chronic osteomyelitis at a prior third metatarsal fracture site.  Patient started on vancomycin and cefepime per pharmacy consult and orthopedic surgery consulted.  We were asked to admit.  Review of Systems: As per HPI; otherwise review of systems reviewed and negative.   Ambulatory Status:  Ambulates with walker.    Past Medical History:  Diagnosis Date   Aortic stenosis    mild AS by echo 02/2021   Asthma    "on daily RX and rescue inhaler" (04/18/2018)   Chronic diastolic CHF (congestive heart failure) ([place]) 09/2015   Chronic lower back pain    Chronic neck pain    Chronic pain syndrome    Fentanyl Patch   Colon polyps    Coronary artery disease    cath with normal LM, 30% LAD, 85% mid RCA and 95% distal RCA s/p PCI of the mid to distal RCA and now on DAPT with ASA and Ticagrelor.     Eczema    Excessive daytime sleepiness 11/26/2015   Fibromyalgia    Gallstones  GERD (gastroesophageal reflux disease)    Heart murmur    "noted for the 1st time on 04/18/2018"   History of blood transfusion 07/2010   "S/P oophorectomy"   History of gout    History of hiatal hernia 1980s   "gone now" (04/18/2018)   Hyperlipidemia    Hypertension    takes Metoprolol and Enalapril daily   Hypothyroidism    takes Synthroid daily   IBS (irritable bowel syndrome)    Migraine    "nothing in the 2000s" (04/18/2018)   Mixed connective tissue disease ([place])    NAFLD (nonalcoholic fatty liver disease)    Pneumonia    "several times" (04/18/2018)   PVC's (premature  ventricular contractions)    noted on event monitor 02/2021   Rheumatoid arthritis ([place])    "hands, elbows, shoulders, probably knees" (04/18/2018)   Scoliosis    Spondylosis    Type II diabetes mellitus ([place])    takes Metformin and Hum R daily (04/18/2018)    Past Surgical History:  Procedure Laterality Date   ABDOMINAL HYSTERECTOMY  06/2005   "w/right ovariy"   AMPUTATION Right 01/28/2020    RIGHT FOURTH AND FIFTH RAY AMPUTATION   AMPUTATION Right 01/28/2020   Procedure: RIGHT FOURTH AND FIFTH RAY AMPUTATION,;  Surgeon: Newt Minion, MD;  Location: [facility];  Service: Orthopedics;  Laterality: Right;   APPENDECTOMY     BREAST BIOPSY Bilateral    9 total (04/18/2018)   CORONARY ANGIOPLASTY WITH STENT PLACEMENT  10/01/2015   normal LM, 30% LAD, 85% mid RCA and 95% distal RCA s/p PCI of the mid to distal RCA and now on DAPT with ASA and Ticagrelor.     CORONARY STENT INTERVENTION Right 04/18/2018   Procedure: CORONARY STENT INTERVENTION;  Surgeon: Burnell Blanks, MD;  Location: [facility];  Service: Cardiovascular;  Laterality: Right;   DILATION AND CURETTAGE OF UTERUS     FRACTURE SURGERY     LAPAROSCOPIC CHOLECYSTECTOMY     LEFT HEART CATH AND CORONARY ANGIOGRAPHY N/A 04/18/2018   Procedure: LEFT HEART CATH AND CORONARY ANGIOGRAPHY;  Surgeon: Burnell Blanks, MD;  Location: [facility];  Service: Cardiovascular;  Laterality: N/A;   MUSCLE BIOPSY Left    "leg"   OOPHORECTOMY  07/2010   RADIOLOGY WITH ANESTHESIA N/A 05/25/2016   Procedure: RADIOLOGY WITH ANESTHESIA;  Surgeon: Medication Radiologist, MD;  Location: [facility];  Service: Radiology;  Laterality: N/A;   TONSILLECTOMY AND ADENOIDECTOMY     WRIST FRACTURE SURGERY Left    "crushed it"    Social History   Socioeconomic History   Marital status: Married    Spouse name: Not on file   Number of children: 1   Years of education: Not on file   Highest education level: Not on file  Occupational History    Not on file  Tobacco Use   Smoking status: Former    Packs/day: 1.00    Years: 19.00    Pack years: 19.00    Types: Cigarettes    Start date: 43    Quit date: 02/11/2004    Years since quitting: 17.3   Smokeless tobacco: Never  Vaping Use   Vaping Use: Never used  Substance and Sexual Activity   Alcohol use: Yes    Comment: RARE   Drug use: Not Currently    Types: Marijuana    Comment: "only in my teens"   Sexual activity: Yes    Birth control/protection: Surgical  Other  Topics Concern   Not on file  Social History Narrative   Not on file   Social Determinants of Health   Financial Resource Strain: Not on file  Food Insecurity: Not on file  Transportation Needs: Not on file  Physical Activity: Not on file  Stress: Not on file  Social Connections: Not on file  Intimate Partner Violence: Not on file    Allergies  Allergen Reactions   Fish Allergy Anaphylaxis    INCLUDES OMEGA 3 OILS   Fish Oil Anaphylaxis and Swelling    THROAT SWELLS INCLUDES FISH AS A CLASS   Omega-3 Fatty Acids Swelling    Throat swelling     Crestor [Rosuvastatin] Other (See Comments)    Extreme joint pain, to this & other statins   Gabapentin Anxiety    Anxiety on high doses   Lovastatin Other (See Comments)    EXTREME JOINT PAIN   Metronidazole Nausea Only and Swelling    Headache and shakes Nausea and vomiting   Red Yeast Rice [Cholestin] Other (See Comments)    Muscle pain and severe joint pain.    Rotigotine Swelling    Extreme edema    Atrovent Hfa [Ipratropium Bromide Hfa] Other (See Comments)    wheezing   Red Yeast Rice Extract     Joint pain   Victoza [Liraglutide]     Unsure of reaction    Enalapril Cough    Cough Cough   Suprep [Na Sulfate-K Sulfate-Mg Sulf] Nausea And Vomiting   Tape Other (See Comments)    Electrodes causes skin breakdown    Family History  Problem Relation Age of Onset   CAD Mother    Hypertension Mother    Heart attack Mother    CAD  Father    Heart attack Father    Allergic rhinitis Father    Asthma Father    Hypertension Brother    Hypertension Brother    Pancreatic cancer Paternal Aunt    Breast cancer Paternal 37    Lung cancer Paternal 30    Asthma Son     Prior to Admission medications   Medication Sig Start Date End Date Taking? Authorizing Provider  acetaminophen (TYLENOL) 650 MG CR tablet Take 1,300 mg by mouth in the morning and at bedtime.    [provider]  albuterol (PROVENTIL) (2.5 MG/3ML) 0.083% nebulizer solution USE 1 VIAL IN NEBULIZER EVERY 4 HOURS AS NEEDED FOR WHEEZING OR  FOR  SHORTNESS  OF  BREATH 08/02/20   Wendling, Crosby Oyster, DO  albuterol (VENTOLIN HFA) 108 (90 Base) MCG/ACT inhaler Inhale 2 puffs into the lungs every 4 (four) hours as needed for wheezing or shortness of breath. 01/24/21   [name] Pal, DO  aspirin EC 81 MG tablet Take 81 mg by mouth daily.    [provider]  Bempedoic Acid-Ezetimibe (NEXLIZET) 180-10 MG TABS Take 1 tablet by mouth daily. 03/07/21   Sueanne Margarita, MD  clotrimazole-betamethasone (LOTRISONE) cream Apply 1 application topically 2 (two) times daily. 10/27/20   [name] Pal, DO  cyclobenzaprine (FLEXERIL) 10 MG tablet Take 0.5-1 tablets (5-10 mg total) by mouth 3 (three) times daily as needed for muscle spasms. 05/18/21   [name] Pal, DO  diltiazem (DILT-XR) 240 MG 24 hr capsule Take 1 capsule (240 mg total) by mouth daily. 02/02/21   Sueanne Margarita, MD  diphenhydrAMINE (BENADRYL) 25 MG tablet Take 25 mg by mouth daily as needed for allergies.    [provider]  doxycycline (VIBRA-TABS) 100 MG tablet Take 1 tablet (100 mg total) by mouth 2 (two) times daily. 02/23/21   Newt Minion, MD  EPINEPHrine (EPIPEN 2-PAK) 0.3 mg/0.3 mL IJ SOAJ injection USE AS DIRECTED FOR SEVERE ALLERGIC REACTION. 09/08/19   [name] Pal, DO  esomeprazole (NEXIUM) 40 MG capsule Take 40 mg by mouth daily as  needed (Heartburn).    [provider]  fluconazole (DIFLUCAN) 150 MG tablet Take 1 tablet (150 mg total) by mouth every 3 (three) days. 05/02/21   [name] Pal, DO  fluticasone (FLONASE) 50 MCG/ACT nasal spray USE 2 SPRAY(S) IN EACH NOSTRIL ONCE DAILY AS NEEDED FOR  ALLERGIES  OR  RHINITIS 04/06/20   [name] Pal, DO  furosemide (LASIX) 20 MG tablet Take 0.5 tablets (10 mg total) by mouth daily. As needed for swelling 02/04/20 02/03/21  Persons, Bevely Palmer, PA  gabapentin (NEURONTIN) 300 MG capsule Take 1,600 mg by mouth as directed. 400 mg in morning, 400 mg at noon, 800 mg at night    [provider]  glucose blood test strip 1 each 4 (four) times daily.  03/28/17   [provider]  HUMULIN R 500 UNIT/ML injection INJECT 0.08-0.3 MLS (40-150 UNITS TOTAL) INTO THE SKIN THREE TIMES DAILY WITH MEALS 04/20/21   Wendling, Crosby Oyster, DO  hydroxychloroquine (PLAQUENIL) 200 MG tablet Take 1 tablet (200 mg total) by mouth 2 (two) times daily. 02/20/20   Wendling, Crosby Oyster, DO  INSULIN SYRINGE 1CC/29G (B-D INSULIN SYRINGE) 29G X 1/2" 1 ML MISC Use three times daily with insulin.  DX E11.9 06/11/20   [name] Pal, DO  isosorbide mononitrate (IMDUR) 30 MG 24 hr tablet Take 1 tablet (30 mg total) by mouth daily. 02/02/21   Sueanne Margarita, MD  levocetirizine (XYZAL) 5 MG tablet TAKE 1 TABLET BY MOUTH ONCE DAILY IN THE EVENING 05/02/21   [name] Pal, DO  levothyroxine (SYNTHROID) 200 MCG tablet TAKE 1 TABLET BY MOUTH ONCE DAILY BEFORE BREAKFAST 05/02/21   [name] Pal, DO  magnesium oxide (MAG-OX) 400 MG tablet Take 400-1,600 mg by mouth 2 (two) times daily as needed (cramps).     [provider]  metFORMIN (GLUCOPHAGE-XR) 500 MG 24 hr tablet Take 2 tablets by mouth twice daily 03/28/21   Nani Ravens, Crosby Oyster, DO  nitroGLYCERIN (NITROSTAT) 0.4 MG SL tablet DISSOLVE ONE TABLET UNDER THE TONGUE EVERY 5 MINUTES AS NEEDED FOR  CHEST PAIN.  DO NOT EXCEED A TOTAL OF 3 DOSES IN 15 MINUTES 10/25/20   Turner, Eber Hong, MD  Oxycodone HCl 10 MG TABS Take 10 mg by mouth every 6 (six) hours as needed (pain).  12/17/19   [provider]  potassium bicarbonate (K-LYTE) 25 MEQ disintegrating tablet Take 25 mEq daily with every dose of Lasix. 05/02/21   [name] Pal, DO  pramipexole (MIRAPEX) 0.5 MG tablet Take 6 mg by mouth See admin instructions. Take 0.5 mg up to 6 times daily as needed for restless legs    [provider]  promethazine (PHENERGAN) 25 MG tablet TAKE 1 TABLET BY MOUTH EVERY 8 HOURS AS NEEDED FOR NAUSEA AND FOR VOMITING 03/01/21   Wendling, Crosby Oyster, DO  saccharomyces boulardii (FLORASTOR) 250 MG capsule Take 250 mg by mouth 3 (three) times daily.    [provider]  valsartan (DIOVAN) 160 MG tablet Take 1 tablet (160 mg total) by mouth daily. 02/02/21   Sueanne Margarita, MD  Vitamin  D, Ergocalciferol, (DRISDOL) 1.25 MG (50000 UNIT) CAPS capsule TAKE 1 CAPSULE BY MOUTH ONCE A WEEK (EVERY  7  DAYS)  TAKE  ON  FRIDAY 03/01/21   [name] Pal, DO    Physical Exam: Vitals:   05/27/21 1000 05/27/21 1045 05/27/21 1441 05/27/21 1445  BP: (!) 134/58 (!) 134/58 (!) 158/92   Pulse: 95 95 67   Resp: (!) 21  20   Temp: 98.3 F (36.8 C)  98.2 F (36.8 C)   TempSrc:   Oral   SpO2: 95% 95% 95%   Weight:      Height:    '5\' 2"'$  (1.575 m)     General:  Appears calm and comfortable and is in NAD Eyes:  PERRL, EOMI, normal lids, iris ENT:  grossly normal hearing, lips & tongue, mmm; appropriate dentition Neck:  no LAD, masses or thyromegaly; no carotid bruits Cardiovascular:  RRR, no m/r/g. No LE edema.  Respiratory:   CTA bilaterally with no wheezes/rales/rhonchi.  Normal respiratory effort. Abdomen:  soft, NT, ND, NABS. Pannus with some erythema.  Back:   normal alignment, no CVAT Skin:  no rash or induration seen on limited exam. Has ulcerations in her umbilicus. Bilateral  lower leg erythema, edema, vesicles. Right foot with probable ulcer on dorsal, lateral side. See picture.  Musculoskeletal:  grossly normal tone BUE/BLE, good ROM, no bony abnormality Lower extremity:  right 4th/5th toe amputations. 2+ distal pulses. Psychiatric:  grossly normal mood and affect, speech fluent and appropriate, AOx3 Neurologic:  CN 2-12 grossly intact, moves all extremities in coordinated fashion, sensation intact     Radiological Exams on Admission: Independently reviewed - see discussion in A/P where applicable  CT Foot Right Wo Contrast  Result Date: 05/26/2021 CLINICAL DATA:  Osteomyelitis, cellulitis EXAM: CT OF THE RIGHT FOOT WITHOUT CONTRAST TECHNIQUE: Multidetector CT imaging of the right foot was performed according to the standard protocol. Multiplanar CT image reconstructions were also generated. COMPARISON:  05/26/2021, 03/19/2021 FINDINGS: Bones/Joint/Cartilage Postsurgical changes are noted from prior amputation at the level of the proximal fourth and fifth metatarsals. The chronic third metatarsal fracture seen previously is again identified. There is continued irregularity along the visualized fracture line, corresponding to the area of abnormal signal on prior MRI, consistent with chronic osteomyelitis. There are no other acute or destructive bony lesions. Prominent osteoarthritis throughout the midfoot. Ligaments Suboptimally assessed by CT. Muscles and Tendons Diffuse muscular atrophy. Soft tissues There is diffuse soft tissue swelling, greatest along the lateral aspect of the midfoot and hindfoot. Soft tissue ulceration is seen along the lateral plantar aspect overlying the fourth metatarsal amputation site. Minimal adjacent subcutaneous gas. No obvious fluid collection or abscess on this unenhanced exam. IMPRESSION: 1. Marked soft tissue swelling of the right foot consistent with cellulitis. Focal soft tissue ulceration along the plantar lateral aspect of the  midfoot. 2. Evidence of chronic osteomyelitis at a prior third metatarsal fracture site. 3. No new bony abnormalities. Electronically Signed   By: Randa Ngo M.D.   On: 05/26/2021 20:36   DG Chest Port 1 View  Result Date: 05/26/2021 CLINICAL DATA:  Sepsis, osteomyelitis EXAM: PORTABLE CHEST 1 VIEW COMPARISON:  04/06/2021 FINDINGS: Single frontal view of the chest demonstrates a stable cardiac silhouette. Chronic central vascular congestion without airspace disease, effusion, or pneumothorax. Azygos fissure again noted. No acute bony abnormalities. IMPRESSION: 1. Chronic central vascular congestion.  No acute airspace disease. Electronically Signed   By: Randa Ngo M.D.   On: 05/26/2021  18:28   DG Foot Complete Left  Result Date: 05/26/2021 CLINICAL DATA:  Left foot bruising over second and third digits EXAM: LEFT FOOT - COMPLETE 3+ VIEW COMPARISON:  None. FINDINGS: Frontal, oblique, lateral views of the left foot are obtained. Evaluation was limited due to patient cooperation and difficulty positioning the patient. There is a minimally displaced intra-articular fracture involving the lateral aspect of the base of the first distal phalanx. I do not see any other acute displaced fractures. Bones are osteopenic. Mild diffuse osteoarthritis. Soft tissue swelling of the forefoot. IMPRESSION: 1. Minimally displaced intra-articular avulsion type fracture at the lateral aspect base of the first distal phalanx. 2. Mild soft tissue swelling of the forefoot. 3. Diffuse osteoarthritis. Electronically Signed   By: Randa Ngo M.D.   On: 05/26/2021 18:31   DG Foot Complete Right  Result Date: 05/26/2021 CLINICAL DATA:  Sepsis, osteomyelitis, erythema and swelling EXAM: RIGHT FOOT COMPLETE - 3+ VIEW COMPARISON:  05/12/2021 FINDINGS: Frontal, oblique, lateral views of the right foot are obtained. Previous amputations at the base of the fourth and fifth metatarsals. Prior third metatarsal fracture is again  identified. Since the prior exam, there is increased prominence of the fracture line with increased angulation. There is overlying soft tissue swelling that has progressed significantly, with soft tissue ulceration and subcutaneous gas. Overall, findings are worrisome for chronic osteomyelitis. No acute displaced fracture.  Prominent calcaneal spurs again seen. IMPRESSION: 1. Progressive soft tissue swelling and soft tissue ulceration within the midfoot, with subcutaneous gas best seen on lateral view. There is increased prominence of the fracture line at the chronic third metatarsal fracture, with increased angulation since prior study. While repeat injury could give this appearance, the findings are highly concerning for chronic osteomyelitis [name] previous MRI finding 03/19/2021. Electronically Signed   By: Randa Ngo M.D.   On: 05/26/2021 18:36    EKG: Independently reviewed.  Sinus tachycardia with rate 120; nonspecific ST changes with no evidence of acute ischemia   Labs on Admission: I have personally reviewed the available labs and imaging studies at the time of the admission.  Pertinent labs:  wbc: 20.9-->11.1,  hgb: 11.4, l lactic acid: 3.3-->2.1,  creatinine: 1.05-->.90    Assessment/Plan Principal Problem:   Sepsis due to chornic right foot ulcer/cellulitis/bacteremia   -60 year old female with known chronic right foot ulcer and worsening cellulitis presenting with sepsis.  Criteria met with elevated white count to 20.9, fever to 102.9, heart rate of 123, elevated lactic acid, and source of right foot ulcer/cellulitis in right foot. -Patient will be admitted and continued on IV antibiotics of Vanco and cefepime -Blood cultures pending -Orthopedics has been consulted: [name] -blood culture pending  -continue lovenox for now  Active Problems: Bacteremia -received page that 1 blood culture growing e faecalis -continue current antibiotics of vanc/cefepime  -ID on board,  echo ordered.  -officially Comptroller.  -repeat blood cultures ordered for tomorrow     Benign essential HTN -elevated, continue home medication     Chronic pain associated with significant psychosocial dysfunction -Continue home pain meds of oxycodone IR 10 mg every 6 hours as needed -Continue gabapentin 3 times daily -flexeril prn  -PMP website verified and fills controlled drugs appropriately     Diabetes mellitus type 2 in obese ([place]) -a1c of 7.3 -Sliding scale insulin and Accu-Cheks per protocol -Hold home metformin and novolin R as not on protocol. Will likely need to adjust sliding scale or add scheduled premeal insulin.  CAD S/P percutaneous coronary angioplasty -Status post PCI of the mid to distal RCA repeat cath in August 2019 showed severe stenosis of the ostial RCA, 99% stenosed, treated with PCI and DES. -continue nexlizet and imdur -hold ASA in case of surgery  -NG prn     Hyperlipidemia -Statin intolerant.  -continue nexlizet     Mixed connective tissue disease (HCC) Continue Plaquenil 200 mg p.o. twice daily. Followed outpatient by rheumatology    Hypothyroidism -Continue  Synthroid    Uncomplicated asthma -No signs of acute exacerbation -Continue S ABA as needed    Gastroesophageal reflux disease -Continue PPI  RLS Continue mirapex     Body mass index is 60.36 kg/m.    Level of care: Telemetry Medical DVT prophylaxis:  lovenox. Thinking about surgery.  Code Status:  Full - confirmed with patient Family Communication: None present Disposition Plan:  The patient is from: home  Anticipated d/c is to: per day team dispo  Requires inpatient hospitalization and is at significant risk of worsening, requires constant monitoring, assessment and MDM with specialists.  Patient is currently: acutely ill Consults called: ortho: [name]   Admission status:  inpatient   Dragon dictation used in completing this note.   Orma Flaming  MD [facility]   How to contact the [facility] Attending or Consulting provider [place] or covering provider during after hours [place], for this patient?  Check the care team in [facility] and look for a) attending/consulting [facility] provider listed and b) the [facility] team listed Log into [email or web address] and use [place]'s universal password to access. If you do not have the password, please contact the hospital operator. Locate the [facility] provider you are looking for under [facility] and page to a number that you can be directly reached. If you still have difficulty reaching the provider, please page the [facility] (Director on Call) for the Hospitalists listed on amion for assistance.   05/27/2021, 3:56 PM

## 2021-05-27 NOTE — Consult Note (Signed)
ORTHOPAEDIC CONSULTATION  REQUESTING PHYSICIAN: Orland Mustard, MD  Chief Complaint: Ulceration cellulitis right foot.  HPI: [name] is a 60 y.o. female who presents with ulceration cellulitis right foot.  Patient has previously undergone a fourth and fifth ray amputation for limb salvage intervention.  Patient developed progressive symptoms of infection and ulceration and she elected to try hyperbaric oxygen.  She has undergone a few treatments.  Patient presents at this time with worsening cellulitis and infection of the right foot.  Past Medical History:  Diagnosis Date   Aortic stenosis    mild AS by echo 02/2021   Asthma    "on daily RX and rescue inhaler" (04/18/2018)   Chronic diastolic CHF (congestive heart failure) (HCC) 09/2015   Chronic lower back pain    Chronic neck pain    Chronic pain syndrome    Fentanyl Patch   Colon polyps    Coronary artery disease    cath with normal LM, 30% LAD, 85% mid RCA and 95% distal RCA s/p PCI of the mid to distal RCA and now on DAPT with ASA and Ticagrelor.     Eczema    Excessive daytime sleepiness 11/26/2015   Fibromyalgia    Gallstones    GERD (gastroesophageal reflux disease)    Heart murmur    "noted for the 1st time on 04/18/2018"   History of blood transfusion 07/2010   "S/P oophorectomy"   History of gout    History of hiatal hernia 1980s   "gone now" (04/18/2018)   Hyperlipidemia    Hypertension    takes Metoprolol and Enalapril daily   Hypothyroidism    takes Synthroid daily   IBS (irritable bowel syndrome)    Migraine    "nothing in the 2000s" (04/18/2018)   Mixed connective tissue disease (HCC)    NAFLD (nonalcoholic fatty liver disease)    Pneumonia    "several times" (04/18/2018)   PVC's (premature ventricular contractions)    noted on event monitor 02/2021   Rheumatoid arthritis (HCC)    "hands, elbows, shoulders, probably knees" (04/18/2018)   Scoliosis    Spondylosis    Type II diabetes mellitus (HCC)     takes Metformin and Hum R daily (04/18/2018)   Past Surgical History:  Procedure Laterality Date   ABDOMINAL HYSTERECTOMY  06/2005   "w/right ovariy"   AMPUTATION Right 01/28/2020    RIGHT FOURTH AND FIFTH RAY AMPUTATION   AMPUTATION Right 01/28/2020   Procedure: RIGHT FOURTH AND FIFTH RAY AMPUTATION,;  Surgeon: [name], MD;  Location: [facility] OR;  Service: Orthopedics;  Laterality: Right;   APPENDECTOMY     BREAST BIOPSY Bilateral    9 total (04/18/2018)   CORONARY ANGIOPLASTY WITH STENT PLACEMENT  10/01/2015   normal LM, 30% LAD, 85% mid RCA and 95% distal RCA s/p PCI of the mid to distal RCA and now on DAPT with ASA and Ticagrelor.     CORONARY STENT INTERVENTION Right 04/18/2018   Procedure: CORONARY STENT INTERVENTION;  Surgeon: [name], MD;  Location: [facility];  Service: Cardiovascular;  Laterality: Right;   DILATION AND CURETTAGE OF UTERUS     FRACTURE SURGERY     LAPAROSCOPIC CHOLECYSTECTOMY     LEFT HEART CATH AND CORONARY ANGIOGRAPHY N/A 04/18/2018   Procedure: LEFT HEART CATH AND CORONARY ANGIOGRAPHY;  Surgeon: [name], MD;  Location: [facility];  Service: Cardiovascular;  Laterality: N/A;   MUSCLE BIOPSY Left    "  leg"   OOPHORECTOMY  07/2010   RADIOLOGY WITH ANESTHESIA N/A 05/25/2016   Procedure: RADIOLOGY WITH ANESTHESIA;  Surgeon: Medication Radiologist, MD;  Location: [facility] OR;  Service: Radiology;  Laterality: N/A;   TONSILLECTOMY AND ADENOIDECTOMY     WRIST FRACTURE SURGERY Left    "crushed it"   Social History   Socioeconomic History   Marital status: Married    Spouse name: Not on file   Number of children: 1   Years of education: Not on file   Highest education level: Not on file  Occupational History   Not on file  Tobacco Use   Smoking status: Former    Packs/day: 1.00    Years: 19.00    Pack years: 19.00    Types: Cigarettes    Start date: 76    Quit date: 02/11/2004    Years since quitting: 17.3    Smokeless tobacco: Never  Vaping Use   Vaping Use: Never used  Substance and Sexual Activity   Alcohol use: Yes    Comment: RARE   Drug use: Not Currently    Types: Marijuana    Comment: "only in my teens"   Sexual activity: Yes    Birth control/protection: Surgical  Other Topics Concern   Not on file  Social History Narrative   Not on file   Social Determinants of Health   Financial Resource Strain: Not on file  Food Insecurity: Not on file  Transportation Needs: Not on file  Physical Activity: Not on file  Stress: Not on file  Social Connections: Not on file   Family History  Problem Relation Age of Onset   CAD Mother    Hypertension Mother    Heart attack Mother    CAD Father    Heart attack Father    Allergic rhinitis Father    Asthma Father    Hypertension Brother    Hypertension Brother    Pancreatic cancer Paternal Aunt    Breast cancer Paternal Aunt    Lung cancer Paternal Aunt    Asthma Son    - negative except otherwise stated in the family history section Allergies  Allergen Reactions   Fish Allergy Anaphylaxis    INCLUDES OMEGA 3 OILS   Fish Oil Anaphylaxis and Swelling    THROAT SWELLS INCLUDES FISH AS A CLASS   Omega-3 Fatty Acids Swelling    Throat swelling     Crestor [Rosuvastatin] Other (See Comments)    Extreme joint pain, to this & other statins   Gabapentin Anxiety    Anxiety on high doses   Lovastatin Other (See Comments)    EXTREME JOINT PAIN   Metronidazole Nausea Only and Swelling    Headache and shakes Nausea and vomiting   Red Yeast Rice [Cholestin] Other (See Comments)    Muscle pain and severe joint pain.    Rotigotine Swelling    Extreme edema    Atrovent Hfa [Ipratropium Bromide Hfa] Other (See Comments)    wheezing   Red Yeast Rice Extract     Joint pain   Victoza [Liraglutide]     Unsure of reaction    Enalapril Cough    Cough Cough   Suprep [Na Sulfate-K Sulfate-Mg Sulf] Nausea And Vomiting   Tape Other (See  Comments)    Electrodes causes skin breakdown   Prior to Admission medications   Medication Sig Start Date End Date Taking? Authorizing Provider  acetaminophen (TYLENOL) 650 MG CR tablet Take 1,300 mg by mouth  in the morning and at bedtime.   Yes [provider]  albuterol (PROVENTIL) (2.5 MG/3ML) 0.083% nebulizer solution USE 1 VIAL IN NEBULIZER EVERY 4 HOURS AS NEEDED FOR WHEEZING OR  FOR  SHORTNESS  OF  BREATH 08/02/20  Yes Sharlene Dory, DO  albuterol (VENTOLIN HFA) 108 (90 Base) MCG/ACT inhaler Inhale 2 puffs into the lungs every 4 (four) hours as needed for wheezing or shortness of breath. 01/24/21  Yes Sharlene Dory, DO  aspirin EC 81 MG tablet Take 81 mg by mouth daily.   Yes [provider]  Bempedoic Acid-Ezetimibe (NEXLIZET) 180-10 MG TABS Take 1 tablet by mouth daily. 03/07/21  Yes Turner, Cornelious Bryant, MD  clotrimazole-betamethasone (LOTRISONE) cream Apply 1 application topically 2 (two) times daily. 10/27/20  Yes Sharlene Dory, DO  cyclobenzaprine (FLEXERIL) 10 MG tablet Take 0.5-1 tablets (5-10 mg total) by mouth 3 (three) times daily as needed for muscle spasms. 05/18/21  Yes Sharlene Dory, DO  diltiazem (DILT-XR) 240 MG 24 hr capsule Take 1 capsule (240 mg total) by mouth daily. 02/02/21  Yes Turner, Cornelious Bryant, MD  diphenhydrAMINE (BENADRYL) 25 MG tablet Take 25 mg by mouth daily as needed for allergies.   Yes [provider]  EPINEPHrine (EPIPEN 2-PAK) 0.3 mg/0.3 mL IJ SOAJ injection USE AS DIRECTED FOR SEVERE ALLERGIC REACTION. 09/08/19  Yes Sharlene Dory, DO  esomeprazole (NEXIUM) 40 MG capsule Take 40 mg by mouth daily as needed (Heartburn).   Yes [provider]  fluticasone (FLONASE) 50 MCG/ACT nasal spray USE 2 SPRAY(S) IN EACH NOSTRIL ONCE DAILY AS NEEDED FOR  ALLERGIES  OR  RHINITIS 04/06/20  Yes Sharlene Dory, DO  furosemide (LASIX) 20 MG tablet Take 0.5 tablets (10 mg total) by mouth daily.  As needed for swelling 02/04/20 05/27/21 Yes Persons, [place], PA  gabapentin (NEURONTIN) 400 MG capsule Take 1,600 mg by mouth as directed. 400 mg in morning, 400 mg at noon, 800 mg at night   Yes [provider]  HUMULIN R 500 UNIT/ML injection INJECT 0.08-0.3 MLS (40-150 UNITS TOTAL) INTO THE SKIN THREE TIMES DAILY WITH MEALS 04/20/21  Yes Sharlene Dory, DO  hydroxychloroquine (PLAQUENIL) 200 MG tablet Take 1 tablet (200 mg total) by mouth 2 (two) times daily. 02/20/20  Yes Sharlene Dory, DO  isosorbide mononitrate (IMDUR) 30 MG 24 hr tablet Take 1 tablet (30 mg total) by mouth daily. 02/02/21  Yes Turner, Cornelious Bryant, MD  levocetirizine (XYZAL) 5 MG tablet TAKE 1 TABLET BY MOUTH ONCE DAILY IN THE EVENING 05/02/21  Yes Wendling, Jilda Roche, DO  levothyroxine (SYNTHROID) 200 MCG tablet TAKE 1 TABLET BY MOUTH ONCE DAILY BEFORE BREAKFAST 05/02/21  Yes Sharlene Dory, DO  magnesium oxide (MAG-OX) 400 MG tablet Take 400-1,600 mg by mouth 2 (two) times daily as needed (cramps).    Yes [provider]  metFORMIN (GLUCOPHAGE-XR) 500 MG 24 hr tablet Take 2 tablets by mouth twice daily 03/28/21  Yes Wendling, Jilda Roche, DO  nitroGLYCERIN (NITROSTAT) 0.4 MG SL tablet DISSOLVE ONE TABLET UNDER THE TONGUE EVERY 5 MINUTES AS NEEDED FOR CHEST PAIN.  DO NOT EXCEED A TOTAL OF 3 DOSES IN 15 MINUTES 10/25/20  Yes Turner, Cornelious Bryant, MD  Oxycodone HCl 10 MG TABS Take 10 mg by mouth every 8 (eight) hours as needed (pain). 12/17/19  Yes [provider]  promethazine (PHENERGAN) 25 MG tablet TAKE 1 TABLET BY MOUTH EVERY 8 HOURS AS NEEDED FOR  NAUSEA AND FOR VOMITING 03/01/21  Yes Sharlene Dory, DO  valsartan (DIOVAN) 160 MG tablet Take 1 tablet (160 mg total) by mouth daily. 02/02/21  Yes Turner, Cornelious Bryant, MD  doxycycline (VIBRA-TABS) 100 MG tablet Take 1 tablet (100 mg total) by mouth 2 (two) times daily. Patient not taking: Reported on 05/27/2021 02/23/21   [name], MD  fluconazole (DIFLUCAN) 150 MG tablet Take 1 tablet (150 mg total) by mouth every 3 (three) days. Patient not taking: Reported on 05/27/2021 05/02/21   Sharlene Dory, DO  glucose blood test strip 1 each 4 (four) times daily.  03/28/17   [provider]  INSULIN SYRINGE 1CC/29G (B-D INSULIN SYRINGE) 29G X 1/2" 1 ML MISC Use three times daily with insulin.  DX E11.9 06/11/20   Sharlene Dory, DO  potassium bicarbonate (K-LYTE) 25 MEQ disintegrating tablet Take 25 mEq daily with every dose of Lasix. 05/02/21   Sharlene Dory, DO  pramipexole (MIRAPEX) 0.5 MG tablet Take 6 mg by mouth See admin instructions. Take 0.5 mg up to 6 times daily as needed for restless legs    [provider]  saccharomyces boulardii (FLORASTOR) 250 MG capsule Take 250 mg by mouth 3 (three) times daily.    [provider]  Vitamin D, Ergocalciferol, (DRISDOL) 1.25 MG (50000 UNIT) CAPS capsule TAKE 1 CAPSULE BY MOUTH ONCE A WEEK (EVERY  7  DAYS)  TAKE  ON  FRIDAY 03/01/21   Sharlene Dory, DO   CT Foot Right Wo Contrast  Result Date: 05/26/2021 CLINICAL DATA:  Osteomyelitis, cellulitis EXAM: CT OF THE RIGHT FOOT WITHOUT CONTRAST TECHNIQUE: Multidetector CT imaging of the right foot was performed according to the standard protocol. Multiplanar CT image reconstructions were also generated. COMPARISON:  05/26/2021, 03/19/2021 FINDINGS: Bones/Joint/Cartilage Postsurgical changes are noted from prior amputation at the level of the proximal fourth and fifth metatarsals. The chronic third metatarsal fracture seen previously is again identified. There is continued irregularity along the visualized fracture line, corresponding to the area of abnormal signal on prior MRI, consistent with chronic osteomyelitis. There are no other acute or destructive bony lesions. Prominent osteoarthritis throughout the midfoot. Ligaments Suboptimally assessed by CT. Muscles and Tendons Diffuse  muscular atrophy. Soft tissues There is diffuse soft tissue swelling, greatest along the lateral aspect of the midfoot and hindfoot. Soft tissue ulceration is seen along the lateral plantar aspect overlying the fourth metatarsal amputation site. Minimal adjacent subcutaneous gas. No obvious fluid collection or abscess on this unenhanced exam. IMPRESSION: 1. Marked soft tissue swelling of the right foot consistent with cellulitis. Focal soft tissue ulceration along the plantar lateral aspect of the midfoot. 2. Evidence of chronic osteomyelitis at a prior third metatarsal fracture site. 3. No new bony abnormalities. Electronically Signed   By: Sharlet Salina M.D.   On: 05/26/2021 20:36   DG Chest Port 1 View  Result Date: 05/26/2021 CLINICAL DATA:  Sepsis, osteomyelitis EXAM: PORTABLE CHEST 1 VIEW COMPARISON:  04/06/2021 FINDINGS: Single frontal view of the chest demonstrates a stable cardiac silhouette. Chronic central vascular congestion without airspace disease, effusion, or pneumothorax. Azygos fissure again noted. No acute bony abnormalities. IMPRESSION: 1. Chronic central vascular congestion.  No acute airspace disease. Electronically Signed   By: Sharlet Salina M.D.   On: 05/26/2021 18:28   DG Foot Complete Left  Result Date: 05/26/2021 CLINICAL DATA:  Left foot bruising over second and third digits EXAM: LEFT FOOT - COMPLETE 3+ VIEW COMPARISON:  None. FINDINGS: Frontal, oblique, lateral views of the left foot are obtained. Evaluation was limited due to patient cooperation and difficulty positioning the patient. There is a minimally displaced intra-articular fracture involving the lateral aspect of the base of the first distal phalanx. I do not see any other acute displaced fractures. Bones are osteopenic. Mild diffuse osteoarthritis. Soft tissue swelling of the forefoot. IMPRESSION: 1. Minimally displaced intra-articular avulsion type fracture at the lateral aspect base of the first distal phalanx. 2.  Mild soft tissue swelling of the forefoot. 3. Diffuse osteoarthritis. Electronically Signed   By: Sharlet Salina M.D.   On: 05/26/2021 18:31   DG Foot Complete Right  Result Date: 05/26/2021 CLINICAL DATA:  Sepsis, osteomyelitis, erythema and swelling EXAM: RIGHT FOOT COMPLETE - 3+ VIEW COMPARISON:  05/12/2021 FINDINGS: Frontal, oblique, lateral views of the right foot are obtained. Previous amputations at the base of the fourth and fifth metatarsals. Prior third metatarsal fracture is again identified. Since the prior exam, there is increased prominence of the fracture line with increased angulation. There is overlying soft tissue swelling that has progressed significantly, with soft tissue ulceration and subcutaneous gas. Overall, findings are worrisome for chronic osteomyelitis. No acute displaced fracture.  Prominent calcaneal spurs again seen. IMPRESSION: 1. Progressive soft tissue swelling and soft tissue ulceration within the midfoot, with subcutaneous gas best seen on lateral view. There is increased prominence of the fracture line at the chronic third metatarsal fracture, with increased angulation since prior study. While repeat injury could give this appearance, the findings are highly concerning for chronic osteomyelitis given previous MRI finding 03/19/2021. Electronically Signed   By: Sharlet Salina M.D.   On: 05/26/2021 18:36   - pertinent xrays, CT, MRI studies were reviewed and independently interpreted  Positive ROS: All other systems have been reviewed and were otherwise negative with the exception of those mentioned in the HPI and as above.  Physical Exam: General: Alert, no acute distress Psychiatric: Patient is competent for consent with normal mood and affect Lymphatic: No axillary or cervical lymphadenopathy Cardiovascular: No pedal edema Respiratory: No cyanosis, no use of accessory musculature GI: No organomegaly, abdomen is soft and  non-tender    Images:  @  Labs:  Lab Results  Component Value Date   HGBA1C 7.3 (H) 05/27/2021   HGBA1C 7.1 (H) 10/27/2020   HGBA1C 9.0 (H) 01/28/2020   ESRSEDRATE 37 (H) 01/02/2018   CRP 9.8 (H) 01/02/2018   REPTSTATUS PENDING 05/26/2021   REPTSTATUS PENDING 05/26/2021   GRAMSTAIN  01/28/2020    ABUNDANT WBC PRESENT,BOTH PMN AND MONONUCLEAR ABUNDANT GRAM POSITIVE COCCI ABUNDANT GRAM NEGATIVE RODS FEW GRAM POSITIVE RODS    CULT PENDING 05/26/2021   CULT  05/26/2021    NO GROWTH < 12 HOURS Performed at [facility], [address] [place], [place] [address]    LABORGA PROTEUS MIRABILIS 01/28/2020   LABORGA STAPHYLOCOCCUS AUREUS 01/28/2020   LABORGA ENTEROCOCCUS FAECALIS 01/28/2020    Lab Results  Component Value Date   ALBUMIN 3.9 05/26/2021   ALBUMIN 4.1 10/27/2020   ALBUMIN 4.0 05/04/2020     CBC EXTENDED Latest Ref Rng & Units 05/27/2021 05/26/2021 05/04/2020  WBC 4.0 - 10.5 K/uL 11.1(H) 20.9(H) 7.4  RBC 3.87 - 5.11 MIL/uL 4.45 4.94 4.50  HGB 12.0 - 15.0 g/dL 11.4(L) 12.6 10.5(L)  HCT 36.0 - 46.0 % 36.6 40.7 35.4  PLT 150 - 400 K/uL 230 278 292  NEUTROABS 1.7 - 7.7 K/uL - 19.0(H) -  LYMPHSABS 0.7 -  4.0 K/uL - 0.9 -    Neurologic: Patient does not have protective sensation bilateral lower extremities.   MUSCULOSKELETAL:   Skin: Examination there is cellulitis and swelling of the right foot there is an ulcer at the base of the lateral column.  This does not probe to bone.  Patient has a strong dorsalis pedis pulse.  No arterial insufficiency.  Her white cell count upon presentation was 20.9.  Hemoglobin A1c has decreased to 7.3.  Review of the radiographs and the CT scan shows cellulitis as well as osteomyelitis of the right foot.  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  Plan: Discussed with the  patient that the antibiotics could suppress the cellulitis but would not cure the infection.  Discussed that with her strong dorsalis pedis pulse and good arterial saturations that hyperbaric oxygen in my opinion would not be beneficial.  I have recommended patient proceed with a transtibial amputation on the right.  Discussed this would improve function, eliminate the infection.  Patient states she will discuss this with her husband this weekend and I will reevaluate patient Monday morning.  Thank you for the consult and the opportunity to see Ms. Candella  Aldean Baker, MD [facility] [phone] 5:22 PM

## 2021-05-27 NOTE — Progress Notes (Signed)
PHARMACY - PHYSICIAN COMMUNICATION CRITICAL VALUE ALERT - BLOOD CULTURE IDENTIFICATION (BCID)  [name] is an 60 y.o. female who presented to [facility] on 05/26/2021 with a chief complaint of Wound infection, possible osteo  Assessment:   History of wound cultures + for E faecalis (sensitive to ampicillin, gent, vanco), MSSA, and proteus mirabilis (pan sensitive) Blood cultures + 1 out of 4 bottles E Faecalis  Name of physician (or Provider) Contacted: Dr. Orland Mustard, MD  Current antibiotics:  Vancomycin 1750mg  q24hr Cefepime 2gm q8hr  Changes to prescribed antibiotics recommended:  Patient is on recommended antibiotics - No changes needed  , PharmD Clinical Pharmacist  Please check AMION for all [facility] numbers After 10:00 PM, call Main Pharmacy [phone]

## 2021-05-28 DIAGNOSIS — M86271 Subacute osteomyelitis, right ankle and foot: Secondary | ICD-10-CM | POA: Diagnosis not present

## 2021-05-28 DIAGNOSIS — B952 Enterococcus as the cause of diseases classified elsewhere: Secondary | ICD-10-CM

## 2021-05-28 DIAGNOSIS — G894 Chronic pain syndrome: Secondary | ICD-10-CM | POA: Diagnosis not present

## 2021-05-28 DIAGNOSIS — I2510 Atherosclerotic heart disease of native coronary artery without angina pectoris: Secondary | ICD-10-CM

## 2021-05-28 DIAGNOSIS — I10 Essential (primary) hypertension: Secondary | ICD-10-CM

## 2021-05-28 DIAGNOSIS — M351 Other overlap syndromes: Secondary | ICD-10-CM

## 2021-05-28 DIAGNOSIS — R7881 Bacteremia: Secondary | ICD-10-CM | POA: Diagnosis not present

## 2021-05-28 DIAGNOSIS — L03115 Cellulitis of right lower limb: Secondary | ICD-10-CM | POA: Diagnosis not present

## 2021-05-28 DIAGNOSIS — E669 Obesity, unspecified: Secondary | ICD-10-CM

## 2021-05-28 DIAGNOSIS — E1169 Type 2 diabetes mellitus with other specified complication: Secondary | ICD-10-CM

## 2021-05-28 DIAGNOSIS — A419 Sepsis, unspecified organism: Secondary | ICD-10-CM

## 2021-05-28 DIAGNOSIS — Z9861 Coronary angioplasty status: Secondary | ICD-10-CM

## 2021-05-28 LAB — HIV ANTIBODY (ROUTINE TESTING W REFLEX): HIV Screen 4th Generation wRfx: NONREACTIVE

## 2021-05-28 LAB — HEPATIC FUNCTION PANEL
ALT: 36 U/L (ref 0–44)
AST: 76 U/L — ABNORMAL HIGH (ref 15–41)
Albumin: 2.9 g/dL — ABNORMAL LOW (ref 3.5–5.0)
Alkaline Phosphatase: 37 U/L — ABNORMAL LOW (ref 38–126)
Bilirubin, Direct: 0.1 mg/dL (ref 0.0–0.2)
Indirect Bilirubin: 1 mg/dL — ABNORMAL HIGH (ref 0.3–0.9)
Total Bilirubin: 1.1 mg/dL (ref 0.3–1.2)
Total Protein: 6.1 g/dL — ABNORMAL LOW (ref 6.5–8.1)

## 2021-05-28 LAB — BASIC METABOLIC PANEL
Anion gap: 11 (ref 5–15)
BUN: 8 mg/dL (ref 6–20)
CO2: 27 mmol/L (ref 22–32)
Calcium: 8.8 mg/dL — ABNORMAL LOW (ref 8.9–10.3)
Chloride: 97 mmol/L — ABNORMAL LOW (ref 98–111)
Creatinine, Ser: 1 mg/dL (ref 0.44–1.00)
GFR, Estimated: >60 mL/min (ref >60)
Glucose, Bld: 236 mg/dL — ABNORMAL HIGH (ref 70–99)
Potassium: 3.7 mmol/L (ref 3.5–5.1)
Sodium: 135 mmol/L (ref 135–145)

## 2021-05-28 LAB — CBC
HCT: 35.2 % — ABNORMAL LOW (ref 36.0–46.0)
Hemoglobin: 10.4 g/dL — ABNORMAL LOW (ref 12.0–15.0)
MCH: 25.1 pg — ABNORMAL LOW (ref 26.0–34.0)
MCHC: 29.5 g/dL — ABNORMAL LOW (ref 30.0–36.0)
MCV: 85 fL (ref 80.0–100.0)
Platelets: 231 10*3/uL (ref 150–400)
RBC: 4.14 MIL/uL (ref 3.87–5.11)
RDW: 17.2 % — ABNORMAL HIGH (ref 11.5–15.5)
WBC: 7.1 10*3/uL (ref 4.0–10.5)
nRBC: 0 % (ref 0.0–0.2)

## 2021-05-28 LAB — GLUCOSE, CAPILLARY
Glucose-Capillary: 254 mg/dL — ABNORMAL HIGH (ref 70–99)
Glucose-Capillary: 290 mg/dL — ABNORMAL HIGH (ref 70–99)
Glucose-Capillary: 247 mg/dL — ABNORMAL HIGH (ref 70–99)
Glucose-Capillary: 205 mg/dL — ABNORMAL HIGH (ref 70–99)

## 2021-05-28 MED ORDER — PRAMIPEXOLE DIHYDROCHLORIDE 1 MG PO TABS
1.0000 mg | ORAL_TABLET | Freq: Two times a day (BID) | ORAL | Status: DC
  Administered 2021-05-28 – 2021-06-08 (×23): 1 mg via ORAL
  Filled 2021-05-28 (×27): qty 1

## 2021-05-28 MED ORDER — INSULIN GLARGINE-YFGN 100 UNIT/ML ~~LOC~~ SOLN
18.0000 [IU] | Freq: Every day | SUBCUTANEOUS | Status: DC
  Administered 2021-05-28: 18 [IU] via SUBCUTANEOUS
  Filled 2021-05-28 (×3): qty 0.18

## 2021-05-28 MED ORDER — INSULIN ASPART 100 UNIT/ML IJ SOLN
0.0000 [IU] | Freq: Every day | INTRAMUSCULAR | Status: DC
  Administered 2021-05-28 – 2021-05-29 (×2): 2 [IU] via SUBCUTANEOUS
  Administered 2021-05-30 – 2021-06-01 (×2): 3 [IU] via SUBCUTANEOUS
  Administered 2021-06-02 – 2021-06-05 (×3): 2 [IU] via SUBCUTANEOUS

## 2021-05-28 MED ORDER — INSULIN ASPART 100 UNIT/ML IJ SOLN
0.0000 [IU] | Freq: Three times a day (TID) | INTRAMUSCULAR | Status: DC
  Administered 2021-05-28: 11 [IU] via SUBCUTANEOUS
  Administered 2021-05-28: 7 [IU] via SUBCUTANEOUS
  Administered 2021-05-29: 4 [IU] via SUBCUTANEOUS
  Administered 2021-05-29: 7 [IU] via SUBCUTANEOUS
  Administered 2021-05-29: 11 [IU] via SUBCUTANEOUS
  Administered 2021-05-30: 4 [IU] via SUBCUTANEOUS
  Administered 2021-05-30: 7 [IU] via SUBCUTANEOUS
  Administered 2021-05-30: 11 [IU] via SUBCUTANEOUS
  Administered 2021-05-31: 3 [IU] via SUBCUTANEOUS
  Administered 2021-05-31: 15 [IU] via SUBCUTANEOUS
  Administered 2021-05-31: 7 [IU] via SUBCUTANEOUS
  Administered 2021-06-01: 11 [IU] via SUBCUTANEOUS
  Administered 2021-06-01: 8 [IU] via SUBCUTANEOUS
  Administered 2021-06-02: 11 [IU] via SUBCUTANEOUS
  Administered 2021-06-02: 4 [IU] via SUBCUTANEOUS
  Administered 2021-06-02: 15 [IU] via SUBCUTANEOUS
  Administered 2021-06-03 – 2021-06-04 (×4): 7 [IU] via SUBCUTANEOUS
  Administered 2021-06-04: 11 [IU] via SUBCUTANEOUS
  Administered 2021-06-04 – 2021-06-05 (×2): 7 [IU] via SUBCUTANEOUS
  Administered 2021-06-05 (×2): 4 [IU] via SUBCUTANEOUS
  Administered 2021-06-06: 7 [IU] via SUBCUTANEOUS
  Administered 2021-06-06 – 2021-06-08 (×5): 4 [IU] via SUBCUTANEOUS
  Administered 2021-06-08: 7 [IU] via SUBCUTANEOUS

## 2021-05-28 MED ORDER — INSULIN ASPART 100 UNIT/ML IJ SOLN
6.0000 [IU] | Freq: Three times a day (TID) | INTRAMUSCULAR | Status: DC
  Administered 2021-05-28 (×2): 6 [IU] via SUBCUTANEOUS

## 2021-05-28 MED ORDER — SODIUM CHLORIDE 0.9 % IV SOLN
3.0000 g | Freq: Four times a day (QID) | INTRAVENOUS | Status: DC
  Administered 2021-05-28 – 2021-06-02 (×19): 3 g via INTRAVENOUS
  Filled 2021-05-28 (×19): qty 8

## 2021-05-28 MED ORDER — ASPIRIN EC 81 MG PO TBEC
81.0000 mg | DELAYED_RELEASE_TABLET | Freq: Every day | ORAL | Status: DC
  Administered 2021-05-28 – 2021-06-08 (×12): 81 mg via ORAL
  Filled 2021-05-28 (×12): qty 1

## 2021-05-28 NOTE — Progress Notes (Signed)
PROGRESS NOTE        PATIENT DETAILS Name: [name] Age: 60 y.o. Sex: female Date of Birth: December 08, 1960 Admit Date: 05/26/2021 Admitting Physician Eduard Clos, MD [id number], Jilda Roche, DO  Brief Narrative: Patient is a 60 y.o. female with history of DM-2, CAD, HTN, MCTD, chronic right foot ulcer (refused BKA in the past as he wanted to try hyperbaric therapy)-presenting with fever/RLE erythema-found to have sepsis and Enterococcus bacteremia.  Subjective: Lying comfortably in bed-denies any chest pain or shortness of breath.  RLE erythema persists.  Objective: Vitals: Blood pressure 117/64, pulse 90, temperature 98.3 F (36.8 C), temperature source Oral, resp. rate 20, height  (1.575 m), weight (!) 149.7 kg, SpO2 95 %.   Exam: Gen Exam:Alert awake-not in any distress HEENT:atraumatic, normocephalic Chest: B/L clear to auscultation anteriorly CVS:S1S2 regular Abdomen:soft non tender, non distended Extremities: RLE erythema-swelling. Neurology: Non focal Skin: no rash  Pertinent Labs/Radiology: WBC:7.1 Hb:10.4 Creatinine:1.0  9/15>>Blood culture: 1/2: Enterococcus faecalis 9/17>> blood culture: Pending  9/15>> CT right foot: Chronic osteomyelitis at third metatarsal fracture site-soft tissue swelling consistent with cellulitis.   Assessment/Plan: Sepsis due to RLE cellulitis/diabetic foot with Enterococcus bacteremia: Sepsis physiology improving-ID narrowed antibiotics to Unasyn.  Repeat blood culture and TTE pending.  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 Lajoyce Corners following-patient seems inclined to pursue BKA at this point.  Dr. Lajoyce Corners will reevaluate on Monday.  HTN: BP stable-continue Imdur, Avapro, Cardizem-follow and adjust.  Chronic pain syndrome: At baseline-continue as needed oxycodone and Neurontin  CAD-s/p RCA PCI on 2019: No anginal symptoms-continue  ASA  DM-2 (A1c 7.3 on 9/16): CBGs on the higher side-on Humulin R 500 sliding scale as outpatient-have started Lantus 18 units daily, 6 units of NovoLog with meals and resistant SSI.  Reassess on 9/18.  Recent Labs    05/27/21 2038 05/28/21 0733 05/28/21 1232  GLUCAP 271* 254* 290*    HLD: Continue Zetia  MCTD: Continue Plaquenil  Hypothyroidism: Continue Synthroid  GERD: Continue PPI  Restless leg syndrome: Continue Mirapex  Bronchial asthma: Not in exacerbation-continue bronchodilators  Morbid Obesity: Estimated body mass index is 60.36 kg/m as calculated from the following:   Height as of this encounter:  (1.575 m).   Weight as of this encounter: 149.7 kg.    Procedures :None Consults: DVT Prophylaxis: Lovenox Code Status:Full code  Family Communication:None at bedside  Time spent: 35 minutes-Greater than 50% of this time was spent in counseling, explanation of diagnosis, planning of further management, and coordination of care.  Diet: Diet Order             Diet heart healthy/carb modified Room service appropriate? Yes; Fluid consistency: Thin  Diet effective now                      Disposition Plan: Status is: Inpatient  Remains inpatient appropriate because:Inpatient level of care appropriate due to severity of illness  Dispo: The patient is from: Home              Anticipated d/c is to: Home              Patient currently is not medically stable to d/c.   Difficult to place patient No    Barriers to Discharge: Enterococcal bacteremia-RLE-chronic osteomyelitis of right  foot-needs BKA.  Antimicrobial agents: Anti-infectives (From admission, onward)    Start     Dose/Rate Route Frequency Ordered Stop   05/28/21 1630  Ampicillin-Sulbactam (UNASYN) 3 g in sodium chloride 0.9 % 100 mL IVPB        3 g 200 mL/hr over 30 Minutes Intravenous Every 6 hours 05/28/21 0946     05/27/21 2200  vancomycin (VANCOREADY) IVPB 1750 mg/350 mL  Status:   Discontinued        1,750 mg 175 mL/hr over 120 Minutes Intravenous Every 24 hours 05/27/21 0759 05/28/21 0849   05/27/21 2200  hydroxychloroquine (PLAQUENIL) tablet 200 mg        200 mg Oral 2 times daily 05/27/21 2004     05/27/21 1800  vancomycin (VANCOREADY) IVPB 1500 mg/300 mL  Status:  Discontinued        1,500 mg 150 mL/hr over 120 Minutes Intravenous Every 24 hours 05/26/21 1812 05/27/21 0757   05/27/21 0800  ceFEPIme (MAXIPIME) 2 g in sodium chloride 0.9 % 100 mL IVPB  Status:  Discontinued        2 g 200 mL/hr over 30 Minutes Intravenous Every 8 hours 05/27/21 0749 05/28/21 0849   05/27/21 0800  vancomycin (VANCOCIN) IVPB 1000 mg/200 mL premix        1,000 mg 200 mL/hr over 60 Minutes Intravenous  Once 05/27/21 0757 05/27/21 0948   05/26/21 2200  ceFEPIme (MAXIPIME) 2 g in sodium chloride 0.9 % 100 mL IVPB  Status:  Discontinued        2 g 200 mL/hr over 30 Minutes Intravenous Every 8 hours 05/26/21 1812 05/27/21 0749   05/26/21 1815  vancomycin (VANCOCIN) IVPB 1000 mg/200 mL premix        1,000 mg 200 mL/hr over 60 Minutes Intravenous Every hour 05/26/21 1812 05/26/21 2014   05/26/21 1745  vancomycin (VANCOCIN) 2,500 mg in sodium chloride 0.9 % 500 mL IVPB  Status:  Discontinued        2,500 mg 250 mL/hr over 120 Minutes Intravenous  Once 05/26/21 1741 05/26/21 1812        MEDICATIONS: Scheduled Meds:  acetaminophen  650 mg Oral Q6H   diltiazem  240 mg Oral Daily   enoxaparin (LOVENOX) injection  0.5 mg/kg Subcutaneous Daily   ezetimibe  10 mg Oral Daily   gabapentin  400 mg Oral BID WC   gabapentin  800 mg Oral QHS   hydroxychloroquine  200 mg Oral BID   insulin aspart  0-20 Units Subcutaneous TID WC   insulin aspart  0-5 Units Subcutaneous QHS   insulin aspart  6 Units Subcutaneous TID WC   insulin glargine-yfgn  18 Units Subcutaneous Daily   irbesartan  150 mg Oral Daily   isosorbide mononitrate  30 mg Oral Daily   levothyroxine  200 mcg Oral QAC breakfast    loratadine  10 mg Oral QHS   mupirocin ointment   Topical TID   pramipexole  1 mg Oral BID   Vitamin D (Ergocalciferol)  50,000 Units Oral Q7 days   Continuous Infusions:  ampicillin-sulbactam (UNASYN) IV     PRN Meds:.acetaminophen **OR** acetaminophen, albuterol, cyclobenzaprine, fluticasone, magnesium oxide, nitroGLYCERIN, oxyCODONE, pantoprazole, pramipexole, promethazine   I have personally reviewed following labs and imaging studies  LABORATORY DATA: CBC: Recent Labs  Lab 05/26/21 1700 05/27/21 0645 05/28/21 0057  WBC 20.9* 11.1* 7.1  NEUTROABS 19.0*  --   --   HGB 12.6 11.4* 10.4*  HCT 40.7 36.6 35.2*  MCV 82.4 82.2 85.0  PLT 278 230 231    Basic Metabolic Panel: Recent Labs  Lab 05/26/21 1700 05/27/21 0645 05/28/21 0057  NA 136 136 135  K 4.9 3.7 3.7  CL 98 101 97*  CO2 24 26 27   GLUCOSE 207* 232* 236*  BUN 9 9 8   CREATININE 1.05* 0.90 1.00  CALCIUM 9.4 8.7* 8.8*    GFR: Estimated Creatinine Clearance: 86 mL/min (by C-G formula based on SCr of 1 mg/dL).  Liver Function Tests: Recent Labs  Lab 05/26/21 1700 05/28/21 0057  AST 42* 76*  ALT 33 36  ALKPHOS 48 37*  BILITOT 0.7 1.1  PROT 7.8 6.1*  ALBUMIN 3.9 2.9*   No results for input(s): LIPASE, AMYLASE in the last 168 hours. No results for input(s): AMMONIA in the last 168 hours.  Coagulation Profile: Recent Labs  Lab 05/26/21 1745  INR 1.1    Cardiac Enzymes: No results for input(s): CKTOTAL, CKMB, CKMBINDEX, TROPONINI in the last 168 hours.  BNP (last 3 results) No results for input(s): PROBNP in the last 8760 hours.  Lipid Profile: No results for input(s): CHOL, HDL, LDLCALC, TRIG, CHOLHDL, LDLDIRECT in the last 72 hours.  Thyroid Function Tests: No results for input(s): TSH, T4TOTAL, FREET4, T3FREE, THYROIDAB in the last 72 hours.  Anemia Panel: No results for input(s): VITAMINB12, FOLATE, FERRITIN, TIBC, IRON, RETICCTPCT in the last 72 hours.  Urine analysis:     Component Value Date/Time   COLORURINE YELLOW 02/03/2020 1630   APPEARANCEUR CLEAR 02/03/2020 1630   LABSPEC 1.008 02/03/2020 1630   PHURINE 5.0 02/03/2020 1630   GLUCOSEU NEGATIVE 02/03/2020 1630   HGBUR NEGATIVE 02/03/2020 1630   BILIRUBINUR NEGATIVE 02/03/2020 1630   KETONESUR NEGATIVE 02/03/2020 1630   PROTEINUR NEGATIVE 02/03/2020 1630   UROBILINOGEN 0.2 02/10/2015 1835   NITRITE NEGATIVE 02/03/2020 1630   LEUKOCYTESUR NEGATIVE 02/03/2020 1630    Sepsis Labs: Lactic Acid, Venous    Component Value Date/Time   LATICACIDVEN 2.1 (HH) 05/27/2021 0643    MICROBIOLOGY: Recent Results (from the past 240 hour(s))  Blood Culture (routine x 2)     Status: Abnormal (Preliminary result)   Collection Time: 05/26/21  5:00 PM   Specimen: Left Antecubital; Blood  Result Value Ref Range Status   Specimen Description   Final    LEFT ANTECUBITAL Performed at [facility], [address] [place], [facility] [place]    Special Requests   Final    BOTTLES DRAWN AEROBIC AND ANAEROBIC Blood Culture adequate volume Performed at [facility], [address] [place], [address] [place]    Culture  Setup Time   Final    GRAM POSITIVE COCCI IN CHAINS AEROBIC BOTTLE ONLY CRITICAL RESULT CALLED TO, READ BACK BY AND VERIFIED [place] PHARMD 1624 05/27/21 A BROWNING    Culture (A)  Final    ENTEROCOCCUS FAECALIS SUSCEPTIBILITIES TO FOLLOW Performed at [facility], [address], [place], [address] [place]    Report Status PENDING  Incomplete  Blood Culture (routine x 2)     Status: None (Preliminary result)   Collection Time: 05/26/21  5:00 PM   Specimen: Left Antecubital; Blood  Result Value Ref Range Status   Specimen Description   Final    LEFT ANTECUBITAL Performed at [facility], [address] [place], [address] [place]    Special Requests   Final    BOTTLES DRAWN AEROBIC AND ANAEROBIC Blood  Culture adequate volume Performed at  [facility], [address] [place], [place] [address]    Culture   Final    NO GROWTH 2 DAYS Performed at [facility], [address] [place], [place] [address]    Report Status PENDING  Incomplete  Resp Panel by RT-PCR (Flu A&B, Covid) Nasopharyngeal Swab     Status: None   Collection Time: 05/26/21  5:00 PM   Specimen: Nasopharyngeal Swab; Nasopharyngeal(NP) swabs in vial transport medium  Result Value Ref Range Status   SARS Coronavirus 2 by RT PCR NEGATIVE NEGATIVE Final    Comment: (NOTE) SARS-CoV-2 target nucleic acids are NOT DETECTED.  The SARS-CoV-2 RNA is generally detectable in upper respiratory specimens during the acute phase of infection. The lowest concentration of SARS-CoV-2 viral copies this assay can detect is 138 copies/mL. A negative result does not preclude SARS-Cov-2 infection and should not be used as the sole basis for treatment or other patient management decisions. A negative result may occur with  improper specimen collection/handling, submission of specimen other than nasopharyngeal swab, presence of viral mutation(s) within the areas targeted by this assay, and inadequate number of viral copies(<138 copies/mL). A negative result must be combined with clinical observations, patient history, and epidemiological information. The expected result is Negative.  Fact Sheet for Patients:  BloggerCourse.com  Fact Sheet for Healthcare Providers:  SeriousBroker.it  This test is no t yet approved or cleared by the [place] FDA and  has been authorized for detection and/or diagnosis of SARS-CoV-2 by FDA under an Emergency Use Authorization (EUA). This EUA will remain  in effect (meaning this test can be used) for the duration of the COVID-19 declaration under Section 564(b)(1) of the Act, 21 U.S.C.section 360bbb-3(b)(1), unless the authorization is terminated  or revoked sooner.        Influenza A by PCR NEGATIVE NEGATIVE Final   Influenza B by PCR NEGATIVE NEGATIVE Final    Comment: (NOTE) The Xpert Xpress SARS-CoV-2/FLU/RSV plus assay is intended as an aid in the diagnosis of influenza from Nasopharyngeal swab specimens and should not be used as a sole basis for treatment. Nasal washings and aspirates are unacceptable for Xpert Xpress SARS-CoV-2/FLU/RSV testing.  Fact Sheet for Patients: BloggerCourse.com  Fact Sheet for Healthcare Providers: SeriousBroker.it  This test is not yet approved or cleared by the [place] FDA and has been authorized for detection and/or diagnosis of SARS-CoV-2 by FDA under an Emergency Use Authorization (EUA). This EUA will remain in effect (meaning this test can be used) for the duration of the COVID-19 declaration under Section 564(b)(1) of the Act, 21 U.S.C. section 360bbb-3(b)(1), unless the authorization is terminated or revoked.  Performed at [facility], [address] [place], [place] [address]   Blood Culture ID Panel (Reflexed)     Status: Abnormal   Collection Time: 05/26/21  5:00 PM  Result Value Ref Range Status   Enterococcus faecalis DETECTED (A) NOT DETECTED Final    Comment: CRITICAL RESULT CALLED TO, READ BACK BY AND VERIFIED WITH: Celedonio Miyamoto PHARMD 1624 05/27/21 A BROWNING    Enterococcus Faecium NOT DETECTED NOT DETECTED Final   Listeria monocytogenes NOT DETECTED NOT DETECTED Final   Staphylococcus species NOT DETECTED NOT DETECTED Final   Staphylococcus aureus (BCID) NOT DETECTED NOT DETECTED Final   Staphylococcus epidermidis NOT DETECTED NOT DETECTED Final   Staphylococcus lugdunensis NOT DETECTED NOT DETECTED Final   Streptococcus species NOT DETECTED NOT DETECTED  Final   Streptococcus agalactiae NOT DETECTED NOT DETECTED Final   Streptococcus pneumoniae NOT DETECTED NOT DETECTED Final   Streptococcus pyogenes NOT DETECTED NOT  DETECTED Final   A.calcoaceticus-baumannii NOT DETECTED NOT DETECTED Final   Bacteroides fragilis NOT DETECTED NOT DETECTED Final   Enterobacterales NOT DETECTED NOT DETECTED Final   Enterobacter cloacae complex NOT DETECTED NOT DETECTED Final   Escherichia coli NOT DETECTED NOT DETECTED Final   Klebsiella aerogenes NOT DETECTED NOT DETECTED Final   Klebsiella oxytoca NOT DETECTED NOT DETECTED Final   Klebsiella pneumoniae NOT DETECTED NOT DETECTED Final   Proteus species NOT DETECTED NOT DETECTED Final   Salmonella species NOT DETECTED NOT DETECTED Final   Serratia marcescens NOT DETECTED NOT DETECTED Final   Haemophilus influenzae NOT DETECTED NOT DETECTED Final   Neisseria meningitidis NOT DETECTED NOT DETECTED Final   Pseudomonas aeruginosa NOT DETECTED NOT DETECTED Final   Stenotrophomonas maltophilia NOT DETECTED NOT DETECTED Final   Candida albicans NOT DETECTED NOT DETECTED Final   Candida auris NOT DETECTED NOT DETECTED Final   Candida glabrata NOT DETECTED NOT DETECTED Final   Candida krusei NOT DETECTED NOT DETECTED Final   Candida parapsilosis NOT DETECTED NOT DETECTED Final   Candida tropicalis NOT DETECTED NOT DETECTED Final   Cryptococcus neoformans/gattii NOT DETECTED NOT DETECTED Final   Vancomycin resistance NOT DETECTED NOT DETECTED Final    Comment: Performed at [facility], [address], [place], [place] [address]    RADIOLOGY STUDIES/RESULTS: CT Foot Right Wo Contrast  Result Date: 05/26/2021 CLINICAL DATA:  Osteomyelitis, cellulitis EXAM: CT OF THE RIGHT FOOT WITHOUT CONTRAST TECHNIQUE: Multidetector CT imaging of the right foot was performed according to the standard protocol. Multiplanar CT image reconstructions were also generated. COMPARISON:  05/26/2021, 03/19/2021 FINDINGS: Bones/Joint/Cartilage Postsurgical changes are noted from prior amputation at the level of the proximal fourth and fifth metatarsals. The chronic third metatarsal fracture seen  previously is again identified. There is continued irregularity along the visualized fracture line, corresponding to the area of abnormal signal on prior MRI, consistent with chronic osteomyelitis. There are no other acute or destructive bony lesions. Prominent osteoarthritis throughout the midfoot. Ligaments Suboptimally assessed by CT. Muscles and Tendons Diffuse muscular atrophy. Soft tissues There is diffuse soft tissue swelling, greatest along the lateral aspect of the midfoot and hindfoot. Soft tissue ulceration is seen along the lateral plantar aspect overlying the fourth metatarsal amputation site. Minimal adjacent subcutaneous gas. No obvious fluid collection or abscess on this unenhanced exam. IMPRESSION: 1. Marked soft tissue swelling of the right foot consistent with cellulitis. Focal soft tissue ulceration along the plantar lateral aspect of the midfoot. 2. Evidence of chronic osteomyelitis at a prior third metatarsal fracture site. 3. No new bony abnormalities. Electronically Signed   By: Sharlet Salina M.D.   On: 05/26/2021 20:36   DG Chest Port 1 View  Result Date: 05/26/2021 CLINICAL DATA:  Sepsis, osteomyelitis EXAM: PORTABLE CHEST 1 VIEW COMPARISON:  04/06/2021 FINDINGS: Single frontal view of the chest demonstrates a stable cardiac silhouette. Chronic central vascular congestion without airspace disease, effusion, or pneumothorax. Azygos fissure again noted. No acute bony abnormalities. IMPRESSION: 1. Chronic central vascular congestion.  No acute airspace disease. Electronically Signed   By: Sharlet Salina M.D.   On: 05/26/2021 18:28   DG Foot Complete Left  Result Date: 05/26/2021 CLINICAL DATA:  Left foot bruising over second and third digits EXAM: LEFT FOOT - COMPLETE 3+ VIEW COMPARISON:  None. FINDINGS: Frontal, oblique,  lateral views of the left foot are obtained. Evaluation was limited due to patient cooperation and difficulty positioning the patient. There is a minimally displaced  intra-articular fracture involving the lateral aspect of the base of the first distal phalanx. I do not see any other acute displaced fractures. Bones are osteopenic. Mild diffuse osteoarthritis. Soft tissue swelling of the forefoot. IMPRESSION: 1. Minimally displaced intra-articular avulsion type fracture at the lateral aspect base of the first distal phalanx. 2. Mild soft tissue swelling of the forefoot. 3. Diffuse osteoarthritis. Electronically Signed   By: Sharlet Salina M.D.   On: 05/26/2021 18:31   DG Foot Complete Right  Result Date: 05/26/2021 CLINICAL DATA:  Sepsis, osteomyelitis, erythema and swelling EXAM: RIGHT FOOT COMPLETE - 3+ VIEW COMPARISON:  05/12/2021 FINDINGS: Frontal, oblique, lateral views of the right foot are obtained. Previous amputations at the base of the fourth and fifth metatarsals. Prior third metatarsal fracture is again identified. Since the prior exam, there is increased prominence of the fracture line with increased angulation. There is overlying soft tissue swelling that has progressed significantly, with soft tissue ulceration and subcutaneous gas. Overall, findings are worrisome for chronic osteomyelitis. No acute displaced fracture.  Prominent calcaneal spurs again seen. IMPRESSION: 1. Progressive soft tissue swelling and soft tissue ulceration within the midfoot, with subcutaneous gas best seen on lateral view. There is increased prominence of the fracture line at the chronic third metatarsal fracture, with increased angulation since prior study. While repeat injury could give this appearance, the findings are highly concerning for chronic osteomyelitis given previous MRI finding 03/19/2021. Electronically Signed   By: Sharlet Salina M.D.   On: 05/26/2021 18:36     LOS: 1 day   Jeoffrey Massed, MD  [facility]    To contact the attending provider between 7A-7P or the covering provider during after hours 7P-7A, please log into the web site [email or web address] and  access using universal [place] password for that web site. If you do not have the password, please call the hospital operator.  05/28/2021, 12:30 PM

## 2021-05-28 NOTE — Plan of Care (Signed)

## 2021-05-28 NOTE — Consult Note (Signed)
[facility]    Date of Admission:  05/26/2021     Reason for Consult: E faecalis bacteremia     Referring Physician: Riley Lam consult  Current antibiotics: Vancomycin 9/15-present Cefepime 9/15-present  Previous antibiotics: Doxy PTA  ASSESSMENT:    60 y.o. 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 Diabetes: A1c 7.3. CAD MCTD: on Plaquenil Severe sepsis: Secondary to #1  RECOMMENDATIONS:    Stop vanc/cefepime and narrow to Unasyn Follow up cultures Agree with Dr Lajoyce Corners that she appears to have failed more conservative treatment options and would benefit from BKA to eradicate infection and prevent recurrence.  She is considering her options and would like to have PT evaluate her as well TTE pending Repeat blood cultures pending Wound care, glycemic control Will follow   Principal Problem:   Sepsis (HCC) Active Problems:   Hyperlipidemia   Mixed connective tissue disease (HCC)   Hypothyroidism   Uncomplicated asthma   Benign essential HTN   Chronic pain associated with significant psychosocial dysfunction   Gastroesophageal reflux disease   Diabetes mellitus type 2 in obese (HCC)   Cellulitis of right foot   CAD S/P percutaneous coronary angioplasty   Foot osteomyelitis (HCC)   MEDICATIONS:    Scheduled Meds: . acetaminophen  650 mg Oral Q6H  . diltiazem  240 mg Oral Daily  . enoxaparin (LOVENOX) injection  0.5 mg/kg Subcutaneous Daily  . ezetimibe  10 mg Oral Daily  . gabapentin  400 mg Oral BID WC  . gabapentin  800 mg Oral QHS  . hydroxychloroquine  200 mg Oral BID  . insulin aspart  0-15 Units Subcutaneous TID WC  . irbesartan  150 mg Oral Daily  . isosorbide mononitrate  30 mg Oral Daily  . levothyroxine  200 mcg Oral QAC breakfast  . loratadine  10 mg Oral QHS  . mupirocin ointment   Topical TID  . Vitamin D (Ergocalciferol)   50,000 Units Oral Q7 days   Continuous Infusions: . ceFEPime (MAXIPIME) IV 2 g (05/28/21 0148)  . vancomycin 1,750 mg (05/27/21 2328)   PRN Meds:.acetaminophen **OR** acetaminophen, albuterol, cyclobenzaprine, fluticasone, magnesium oxide, nitroGLYCERIN, oxyCODONE, pantoprazole, pramipexole, promethazine  HPI:    Malee Grays Quigley is a 60 y.o. 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  Dr Lajoyce Corners has been following her as an outpatient and is recommending BKA but she has been hesitant to do so and doing hyperbaric oxygen instead.  CT scan on admission showed marked soft tissue swelling of the right foot consistent with cellulitis. Focal soft tissue ulceration along the plantar lateral aspect of the midfoot. Evidence of chronic osteomyelitis at a prior third metatarsal fracture site.  She has been evaluated by surgery who has continued to recommend amputation.  She is on vancomycin and cefepime.  Blood cultures from 9/15 are positive in 1 of 4 bottles for E faecalis.     Past Medical History:  Diagnosis Date  . Aortic stenosis    mild AS by echo 02/2021  . Asthma    "on daily RX and rescue inhaler" (04/18/2018)  . Chronic diastolic CHF (congestive heart failure) (HCC) 09/2015  . Chronic lower back pain   . Chronic neck pain   .  Chronic pain syndrome    Fentanyl Patch  . Colon polyps   . Coronary artery disease    cath with normal LM, 30% LAD, 85% mid RCA and 95% distal RCA s/p PCI of the mid to distal RCA and now on DAPT with ASA and Ticagrelor.    . Eczema   . Excessive daytime sleepiness 11/26/2015  . Fibromyalgia   . Gallstones   . GERD (gastroesophageal reflux disease)   . Heart murmur    "noted for the 1st time on 04/18/2018"  . History of blood transfusion 07/2010   "S/P oophorectomy"  . History  of gout   . History of hiatal hernia 1980s   "gone now" (04/18/2018)  . Hyperlipidemia   . Hypertension    takes Metoprolol and Enalapril daily  . Hypothyroidism    takes Synthroid daily  . IBS (irritable bowel syndrome)   . Migraine    "nothing in the 2000s" (04/18/2018)  . Mixed connective tissue disease (HCC)   . NAFLD (nonalcoholic fatty liver disease)   . Pneumonia    "several times" (04/18/2018)  . PVC's (premature ventricular contractions)    noted on event monitor 02/2021  . Rheumatoid arthritis (HCC)    "hands, elbows, shoulders, probably knees" (04/18/2018)  . Scoliosis   . Spondylosis   . Type II diabetes mellitus (HCC)    takes Metformin and Hum R daily (04/18/2018)    Social History   Tobacco Use  . Smoking status: Former    Packs/day: 1.00    Years: 19.00    Pack years: 19.00    Types: Cigarettes    Start date: 41    Quit date: 02/11/2004    Years since quitting: 17.3  . Smokeless tobacco: Never  Vaping Use  . Vaping Use: Never used  Substance Use Topics  . Alcohol use: Yes    Comment: RARE  . Drug use: Not Currently    Types: Marijuana    Comment: "only in my teens"    Family History  Problem Relation Age of Onset  . CAD Mother   . Hypertension Mother   . Heart attack Mother   . CAD Father   . Heart attack Father   . Allergic rhinitis Father   . Asthma Father   . Hypertension Brother   . Hypertension Brother   . Pancreatic cancer Paternal Aunt   . Breast cancer Paternal Aunt   . Lung cancer Paternal Aunt   . Asthma Son     Allergies  Allergen Reactions  . Fish Allergy Anaphylaxis    INCLUDES OMEGA 3 OILS  . Fish Oil Anaphylaxis and Swelling    THROAT SWELLS INCLUDES FISH AS A CLASS  . Omega-3 Fatty Acids Swelling    Throat swelling    . Crestor [Rosuvastatin] Other (See Comments)    Extreme joint pain, to this & other statins  . Gabapentin Anxiety    Anxiety on high doses  . Lovastatin Other (See Comments)    EXTREME JOINT PAIN  .  Metronidazole Nausea Only and Swelling    Headache and shakes Nausea and vomiting  . Red Yeast Rice [Cholestin] Other (See Comments)    Muscle pain and severe joint pain.   . Rotigotine Swelling    Extreme edema   . Atrovent Hfa [Ipratropium Bromide Hfa] Other (See Comments)    wheezing  . Red Yeast Rice Extract     Joint pain  . Victoza [Liraglutide]     Unsure of reaction   .  Enalapril Cough    Cough Cough  . Suprep [Na Sulfate-K Sulfate-Mg Sulf] Nausea And Vomiting  . Tape Other (See Comments)    Electrodes causes skin breakdown    Review of Systems  Constitutional:  Positive for fever (prior to admission).  Respiratory: Negative.    Cardiovascular: Negative.   Gastrointestinal: Negative.   Genitourinary: Negative.   Musculoskeletal:  Positive for joint pain.  Skin:        + Erythema and stasis dermatitis.   All other systems reviewed and are negative.  OBJECTIVE:   Blood pressure (!) 116/57, pulse 90, temperature 98.1 F (36.7 C), temperature source Oral, resp. rate 18, height 5\' 2"  (1.575 m), weight (!) 149.7 kg, SpO2 93 %. Body mass index is 60.36 kg/m.  Physical Exam Constitutional:      General: She is not in acute distress.    Appearance: Normal appearance.     Comments: Very pleasant woman, sitting in bed, having breakfast  HENT:     Head: Normocephalic and atraumatic.  Eyes:     Extraocular Movements: Extraocular movements intact.     Conjunctiva/sclera: Conjunctivae normal.  Pulmonary:     Effort: Pulmonary effort is normal. No respiratory distress.  Musculoskeletal:     Cervical back: Normal range of motion and neck supple.     Right lower leg: Edema present.     Left lower leg: Edema present.  Skin:    General: Skin is warm and dry.     Findings: Erythema present.     Comments: Bilateral lower extremities with chronic venous stasis dermatitis.  Right foot s/p 4th and 5th ray amputation.  Dressing covering current wound.  See pictures below.    Neurological:     General: No focal deficit present.     Mental Status: She is oriented to person, place, and time.  Psychiatric:        Mood and Affect: Mood normal.        Behavior: Behavior normal.          Lab Results: Lab Results  Component Value Date   WBC 7.1 05/28/2021   HGB 10.4 (L) 05/28/2021   HCT 35.2 (L) 05/28/2021   MCV 85.0 05/28/2021   PLT 231 05/28/2021    Lab Results  Component Value Date   NA 135 05/28/2021   K 3.7 05/28/2021   CO2 27 05/28/2021   GLUCOSE 236 (H) 05/28/2021   BUN 8 05/28/2021   CREATININE 1.00 05/28/2021   CALCIUM 8.8 (L) 05/28/2021   GFRNONAA >60 05/28/2021   GFRAA 87 05/04/2020    Lab Results  Component Value Date   ALT 36 05/28/2021   AST 76 (H) 05/28/2021   ALKPHOS 37 (L) 05/28/2021   BILITOT 1.1 05/28/2021       Component Value Date/Time   CRP 9.8 (H) 01/02/2018 1809       Component Value Date/Time   ESRSEDRATE 37 (H) 01/02/2018 1809    I have reviewed the micro and lab results in Epic.  Imaging: CT Foot Right Wo Contrast  Result Date: 05/26/2021 CLINICAL DATA:  Osteomyelitis, cellulitis EXAM: CT OF THE RIGHT FOOT WITHOUT CONTRAST TECHNIQUE: Multidetector CT imaging of the right foot was performed according to the standard protocol. Multiplanar CT image reconstructions were also generated. COMPARISON:  05/26/2021, 03/19/2021 FINDINGS: Bones/Joint/Cartilage Postsurgical changes are noted from prior amputation at the level of the proximal fourth and fifth metatarsals. The chronic third metatarsal fracture seen previously is again identified. There is continued irregularity  along the visualized fracture line, corresponding to the area of abnormal signal on prior MRI, consistent with chronic osteomyelitis. There are no other acute or destructive bony lesions. Prominent osteoarthritis throughout the midfoot. Ligaments Suboptimally assessed by CT. Muscles and Tendons Diffuse muscular atrophy. Soft tissues There is diffuse  soft tissue swelling, greatest along the lateral aspect of the midfoot and hindfoot. Soft tissue ulceration is seen along the lateral plantar aspect overlying the fourth metatarsal amputation site. Minimal adjacent subcutaneous gas. No obvious fluid collection or abscess on this unenhanced exam. IMPRESSION: 1. Marked soft tissue swelling of the right foot consistent with cellulitis. Focal soft tissue ulceration along the plantar lateral aspect of the midfoot. 2. Evidence of chronic osteomyelitis at a prior third metatarsal fracture site. 3. No new bony abnormalities. Electronically Signed   By: Sharlet Salina M.D.   On: 05/26/2021 20:36   DG Chest Port 1 View  Result Date: 05/26/2021 CLINICAL DATA:  Sepsis, osteomyelitis EXAM: PORTABLE CHEST 1 VIEW COMPARISON:  04/06/2021 FINDINGS: Single frontal view of the chest demonstrates a stable cardiac silhouette. Chronic central vascular congestion without airspace disease, effusion, or pneumothorax. Azygos fissure again noted. No acute bony abnormalities. IMPRESSION: 1. Chronic central vascular congestion.  No acute airspace disease. Electronically Signed   By: Sharlet Salina M.D.   On: 05/26/2021 18:28   DG Foot Complete Left  Result Date: 05/26/2021 CLINICAL DATA:  Left foot bruising over second and third digits EXAM: LEFT FOOT - COMPLETE 3+ VIEW COMPARISON:  None. FINDINGS: Frontal, oblique, lateral views of the left foot are obtained. Evaluation was limited due to patient cooperation and difficulty positioning the patient. There is a minimally displaced intra-articular fracture involving the lateral aspect of the base of the first distal phalanx. I do not see any other acute displaced fractures. Bones are osteopenic. Mild diffuse osteoarthritis. Soft tissue swelling of the forefoot. IMPRESSION: 1. Minimally displaced intra-articular avulsion type fracture at the lateral aspect base of the first distal phalanx. 2. Mild soft tissue swelling of the forefoot. 3.  Diffuse osteoarthritis. Electronically Signed   By: Sharlet Salina M.D.   On: 05/26/2021 18:31   DG Foot Complete Right  Result Date: 05/26/2021 CLINICAL DATA:  Sepsis, osteomyelitis, erythema and swelling EXAM: RIGHT FOOT COMPLETE - 3+ VIEW COMPARISON:  05/12/2021 FINDINGS: Frontal, oblique, lateral views of the right foot are obtained. Previous amputations at the base of the fourth and fifth metatarsals. Prior third metatarsal fracture is again identified. Since the prior exam, there is increased prominence of the fracture line with increased angulation. There is overlying soft tissue swelling that has progressed significantly, with soft tissue ulceration and subcutaneous gas. Overall, findings are worrisome for chronic osteomyelitis. No acute displaced fracture.  Prominent calcaneal spurs again seen. IMPRESSION: 1. Progressive soft tissue swelling and soft tissue ulceration within the midfoot, with subcutaneous gas best seen on lateral view. There is increased prominence of the fracture line at the chronic third metatarsal fracture, with increased angulation since prior study. While repeat injury could give this appearance, the findings are highly concerning for chronic osteomyelitis given previous MRI finding 03/19/2021. Electronically Signed   By: Sharlet Salina M.D.   On: 05/26/2021 18:36     Imaging independently reviewed in Epic.  Vedia Coffer for Infectious Disease [facility] [phone] pager 05/28/2021, 8:42 AM

## 2021-05-28 NOTE — Progress Notes (Signed)
Pharmacy Antibiotic Note  [name] is a 60 y.o. female admitted on 05/26/2021 with enterococcus faecalis bacteremia 2/2 cellulitis and chronic R diabetic foot ulcer. CT shows cellulitis and chronic osteo. Pharmacy was consulted to dose vancomycin and cefepime, now narrowing to unasyn with pharmacy consult to dose.  Afebrile, WBC 20.9>7.1, Scr stable 1.00  Plan: Stop vancomycin and cefepime  Start Unasyn 3g q6h F/u renal fxn, clinical status and duration   Temp (24hrs), Avg:98.4 F (36.9 C), Min:98.1 F (36.7 C), Max:98.9 F (37.2 C)  Recent Labs  Lab 05/26/21 1700 05/27/21 0643 05/27/21 0645 05/28/21 0057  WBC 20.9*  --  11.1* 7.1  CREATININE 1.05*  --  0.90 1.00  LATICACIDVEN 3.3* 2.1*  --   --      Estimated Creatinine Clearance: 86 mL/min (by C-G formula based on SCr of 1 mg/dL).    Antimicrobials this admission: Vancomycin 9/15 >> 9/17 Cefepime 9/15 >> 9/17 Unasyn 9/17   Dose adjustments this admission: none  Microbiology results: 9/15 BCx: gram + cocci in chains 9/15 BCID: enterococcus faecalis   Thank you for allowing pharmacy to participate in this patient's care.  Marja Kays, PharmD PGY1 Acute Care Resident  05/28/2021,9:40 AM

## 2021-05-28 NOTE — Evaluation (Signed)
Physical Therapy Evaluation Patient Details Name: [name] MRN: [id number] DOB: 1960/11/01 Today's Date: 05/28/2021  History of Present Illness  Pt is a 60 y.o. female admitted 05/26/21 with fever and RLE erythema; found to have sepsis due to RLE cellulitis/diabetic foot with Enterococcus bacteremia. 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 PMH includes asthma, CHF, CAD, HLD, HTN, DM2, obesity, connective tissue disease, chronic pain, R toe amputations (01/2020).   Clinical Impression  Pt seen for a pre-operative assessment before deciding to proceed with recommended RLE transtibial amputation. PTA, pt limited household ambulator without DME, is pushed by husband in w/c for community mobility; pt lives with husband who assists with ADLs as needed, husband works. Today, pt independent with mobility; trialled mobility as if pt had R BKA, pt ultimately unable to stand or hop using RW without RLE; scooting/pivots also limited due to baseline BUE weakness. Significant increased time discussing post-op mobility progression, expected assist needs, and likely DME recommendations. Pt motivated to have sx, but reports husband still unsure. If pt to proceed with surgery, expect she will require post-acute rehab prior to return home. Will follow acutely to address established goals.       Recommendations for follow up therapy are one component of a multi-disciplinary discharge planning process, led by the attending physician.  Recommendations may be updated based on patient status, additional functional criteria and insurance authorization.  Follow Up Recommendations SNF;Supervision for mobility/OOB (expect pt will require SNF for rehab if she proceeds with R BKA sx)    Equipment Recommendations  If pt proceeds with sx, will likely need: bariatric wheelchair (with removable armrests), bariatric drop-arm  BSC, hospital bed, slide board (?)   Recommendations for Other Services       Precautions / Restrictions Precautions Precautions: None Restrictions Weight Bearing Restrictions: No      Mobility  Bed Mobility Overal bed mobility: Modified Independent                  Transfers Overall transfer level: Modified independent               General transfer comment: Pt independent standing without DME. Trialled standing with "RLE NWB" to mimic potential R BKA, pt unable to stand without support of RLE and opted to "cheat" with use of R foot. Demonstrated technique for standing on LLE only, scooting/hopping, stand pivot, squat pivot, lateral scoot; described use of slide board  Ambulation/Gait Ambulation/Gait assistance: Independent           General Gait Details: Pt independent ambulating without DME; pt unable to hop on LLE with RW while keeping "RLE NWB" in order to simulate R BKA  Stairs            Wheelchair Mobility    Modified Rankin (Stroke Patients Only)       Balance Overall balance assessment: Needs assistance   Sitting balance-[name]: Fair Sitting balance - Comments: Difficulty reaching bilateral feet sitting EOB     Standing balance-[name]: Good                               Pertinent Vitals/Pain Pain Assessment: 0-10 Pain Score: 3  Pain Location: Elbows, R knee Pain Descriptors / Indicators: Sore Pain Intervention(s): Monitored during session    Home Living Family/patient expects to be discharged to:: Private residence Living Arrangements:  Spouse/significant other Available Help at Discharge: Family;Available PRN/intermittently Type of Home: House Home Access: Ramped entrance     Home Layout: Two level;Able to live on main level with bedroom/bathroom Home Equipment: Tub bench;Bedside commode;Walker - 4 wheels;Walker - 2 wheels;Wheelchair - manual Additional Comments: Comfort height toilet too tall for  pt, so she keeps BSC right outside bathroom and uses that;    Prior Function Level of Independence: Independent;Needs assistance   Gait / Transfers Assistance Needed: Independent without DME to walk household distances, will hold onto furniture if needed for balance. Does not walk outside, husband pushes her in w/c; pt does not have strength to push w/c herself ("I don't have strength in my arms"). Sleeps in recliner often  ADL's / Homemaking Assistance Needed: Mod indep with pericare using wiping stick for posterior pericare; husband present for bathing since pt fearful of falling, but pt does not need physical assist, uses tub bench. Husband does majority of cooking and cleaning        Hand Dominance   Dominant Hand: Right    Extremity/Trunk Assessment   Upper Extremity Assessment Upper Extremity Assessment: Generalized weakness (chronic joint pain)    Lower Extremity Assessment Lower Extremity Assessment: Generalized weakness (chronic pain; noted swelling/redness R lower leg)       Communication   Communication: No difficulties  Cognition Arousal/Alertness: Awake/alert Behavior During Therapy: WFL for tasks assessed/performed Overall Cognitive Status: Within Functional Limits for tasks assessed                                        General Comments General comments (skin integrity, edema, etc.): Significant increased time and effort discussing expected mobility progression if pt proceeds with BKA, including expected mobility difficulties (as pt unable to currently hop on LLE with RW, unable to stand with single LLE, arm weakness limits ability to scoot), assist needed, DME needs (needs w/c with removable armrests, drop-arm BSC, likely hospital bed, potential slide board), pt's poor healing due to multiple comorbidities that may lead to additional future RLE amputation (but also the fact that if she does not do procedure now, she may need higher RLE amputation as  disease progresses). Husband present at end of session; pt agreeable to sx, but husband is not according to her    Exercises     Assessment/Plan    PT Assessment Patient needs continued PT services  PT Problem List Decreased strength;Decreased activity tolerance;Obesity;Decreased knowledge of use of DME;Decreased knowledge of precautions       PT Treatment Interventions DME instruction;Gait training;Functional mobility training;Therapeutic activities;Therapeutic exercise;Balance training;Patient/family education;Wheelchair mobility training    PT Goals (Current goals can be found in the Care Plan section)  Acute Rehab PT Goals Patient Stated Goal: "I'd plan to do the surgery but have to get my husband on board" PT Goal Formulation: With patient Time For Goal Achievement: 06/11/21 Potential to Achieve Goals: Good    Frequency Min 3X/week   Barriers to discharge        Co-evaluation               AM-PAC PT "6 Clicks" Mobility  Outcome Measure Help needed turning from your back to your side while in a flat bed without using bedrails?: None Help needed moving from lying on your back to sitting on the side of a flat bed without using bedrails?: None Help needed moving to and from  a bed to a chair (including a wheelchair)?: None Help needed standing up from a chair using your arms (e.g., wheelchair or bedside chair)?: None Help needed to walk in hospital room?: None Help needed climbing 3-5 steps with a railing? : A Little 6 Click Score: 23    End of Session   Activity Tolerance: Patient tolerated treatment well Patient left: in bed;with call bell/phone within reach;with family/visitor present Nurse Communication: Mobility status PT Visit Diagnosis: Other abnormalities of gait and mobility (R26.89);Muscle weakness (generalized) (M62.81);Pain    Time: 3329-5188 PT Time Calculation (min) (ACUTE ONLY): 40 min   Charges:   PT Evaluation $PT Eval Low Complexity: 1  Low PT Treatments $Self Care/Home Management: 23-37      Ina Homes, PT, DPT Acute Rehabilitation Services  Pager [phone] Office [phone]  Malachy Chamber 05/28/2021, 5:15 PM

## 2021-05-29 ENCOUNTER — Inpatient Hospital Stay (HOSPITAL_COMMUNITY): Payer: 59

## 2021-05-29 DIAGNOSIS — R7881 Bacteremia: Secondary | ICD-10-CM | POA: Diagnosis not present

## 2021-05-29 DIAGNOSIS — I10 Essential (primary) hypertension: Secondary | ICD-10-CM | POA: Diagnosis not present

## 2021-05-29 DIAGNOSIS — G894 Chronic pain syndrome: Secondary | ICD-10-CM | POA: Diagnosis not present

## 2021-05-29 DIAGNOSIS — I2510 Atherosclerotic heart disease of native coronary artery without angina pectoris: Secondary | ICD-10-CM | POA: Diagnosis not present

## 2021-05-29 LAB — CBC
HCT: 34.8 % — ABNORMAL LOW (ref 36.0–46.0)
Hemoglobin: 10.3 g/dL — ABNORMAL LOW (ref 12.0–15.0)
MCH: 25.2 pg — ABNORMAL LOW (ref 26.0–34.0)
MCHC: 29.6 g/dL — ABNORMAL LOW (ref 30.0–36.0)
MCV: 85.3 fL (ref 80.0–100.0)
Platelets: 229 10*3/uL (ref 150–400)
RBC: 4.08 MIL/uL (ref 3.87–5.11)
RDW: 17 % — ABNORMAL HIGH (ref 11.5–15.5)
WBC: 6.4 10*3/uL (ref 4.0–10.5)
nRBC: 0 % (ref 0.0–0.2)

## 2021-05-29 LAB — BASIC METABOLIC PANEL
Anion gap: 11 (ref 5–15)
BUN: 10 mg/dL (ref 6–20)
CO2: 27 mmol/L (ref 22–32)
Calcium: 8.6 mg/dL — ABNORMAL LOW (ref 8.9–10.3)
Chloride: 98 mmol/L (ref 98–111)
Creatinine, Ser: 1.15 mg/dL — ABNORMAL HIGH (ref 0.44–1.00)
GFR, Estimated: 55 mL/min — ABNORMAL LOW (ref >60)
Glucose, Bld: 279 mg/dL — ABNORMAL HIGH (ref 70–99)
Potassium: 3.7 mmol/L (ref 3.5–5.1)
Sodium: 136 mmol/L (ref 135–145)

## 2021-05-29 LAB — CULTURE, BLOOD (ROUTINE X 2): Special Requests: ADEQUATE

## 2021-05-29 LAB — ECHOCARDIOGRAM COMPLETE
AR max vel: 2.01 cm2
AV Area VTI: 1.83 cm2
AV Area mean vel: 2.09 cm2
AV Mean grad: 16 mmHg
AV Peak grad: 32.3 mmHg
Ao pk vel: 2.84 m/s
Area-P 1/2: 4.63 cm2
Height: 62 in
S' Lateral: 3.5 cm
Weight: 5280.46 oz

## 2021-05-29 LAB — GLUCOSE, CAPILLARY
Glucose-Capillary: 262 mg/dL — ABNORMAL HIGH (ref 70–99)
Glucose-Capillary: 235 mg/dL — ABNORMAL HIGH (ref 70–99)
Glucose-Capillary: 270 mg/dL — ABNORMAL HIGH (ref 70–99)
Glucose-Capillary: 238 mg/dL — ABNORMAL HIGH (ref 70–99)
Glucose-Capillary: 192 mg/dL — ABNORMAL HIGH (ref 70–99)
Glucose-Capillary: 242 mg/dL — ABNORMAL HIGH (ref 70–99)

## 2021-05-29 MED ORDER — PERFLUTREN LIPID MICROSPHERE
1.0000 mL | INTRAVENOUS | Status: AC | PRN
  Administered 2021-05-29: 4 mL via INTRAVENOUS
  Filled 2021-05-29: qty 10

## 2021-05-29 MED ORDER — INSULIN ASPART 100 UNIT/ML IJ SOLN: 8.0000 [IU] | Freq: Three times a day (TID) | INTRAMUSCULAR | Status: DC

## 2021-05-29 MED ORDER — INSULIN ASPART 100 UNIT/ML IJ SOLN
10.0000 [IU] | Freq: Three times a day (TID) | INTRAMUSCULAR | Status: DC
  Administered 2021-05-29 – 2021-05-30 (×3): 10 [IU] via SUBCUTANEOUS

## 2021-05-29 MED ORDER — INSULIN GLARGINE-YFGN 100 UNIT/ML ~~LOC~~ SOLN
26.0000 [IU] | Freq: Every day | SUBCUTANEOUS | Status: DC
  Administered 2021-05-29: 26 [IU] via SUBCUTANEOUS
  Filled 2021-05-29 (×2): qty 0.26

## 2021-05-29 MED ORDER — INSULIN GLARGINE-YFGN 100 UNIT/ML ~~LOC~~ SOLN
25.0000 [IU] | Freq: Every day | SUBCUTANEOUS | Status: DC
  Filled 2021-05-29 (×2): qty 0.25

## 2021-05-29 NOTE — TOC Progression Note (Addendum)
Transition of Care [facility]) - Progression Note    Patient Details  Name: [name] MRN: [id number] Date of Birth: [birth year]  [facility]) CM/SW Contact  Lawerance Sabal, RN Phone Number: 05/29/2021, 11:13 AM  Clinical Narrative:     9/18 TOC will follow for PT eval and recommendations following R BKA.  If patient wishes to DC to home w HH, [facility] cannot accept referral, as they no longer service [facility] plans. [facility] is looking into referral, and is able to accept if needed. Patient is aware.   TOC will follow for final DME and dispo recommendations from post op PT assessment.   9/20 Notified by [facility] that they are not able to accept. Another option would be [facility]. Amputation scheduled for 9/21, will follow for post op PT eval and reassessment of patient's dispo plan.  Referral made to [facility], and they are able to accept    Expected Discharge Plan: Home w Home Health Services Barriers to Discharge: Continued Medical Work up  Expected Discharge Plan and Services Expected Discharge Plan: Home w Home Health Services In-house Referral: Clinical Social Work     Living arrangements for the past 2 months: Single Family Home                                       Social Determinants of Health (SDOH) Interventions    Readmission Risk Interventions No flowsheet data found.

## 2021-05-29 NOTE — Plan of Care (Signed)

## 2021-05-29 NOTE — TOC Initial Note (Signed)
Transition of Care [facility]) - Initial/Assessment Note    Patient Details  Name: [name] MRN: [id number] Date of Birth: [birth year]  [facility]) CM/SW Contact:    [name] Sill, LCSW Phone Number: 05/29/2021, 10:52 AM  Clinical Narrative:                  CSW met with patient and her spouse, [name] CSW introduced self and explained role. CSW informed of PT recommendation of short term rehab at [facility]. Patient declined SNF and states her preference is to go home w/ HH. She reports having [facility] in the past and wants them as her first choice. Patient spouse [name], states he will be there to provide the care needed for patient.  Patient expressed she has accepted the fact she will need BKA but still have some anxiety about how things "going to look after" referring to her mobility,etc. CSW acknowledged her feelings. She explained talking with PT was really helpful and she would like to talk more with them. Patient states no question for now but believes she has questions but just don't know what to ask. CSW encourage patient/spouse to write down any questions, that may come to mind so they can discuss with the medical staff. Patient/spouse states understanding.   TOC will continue to follow and assist with discharge planning.   [name], MSW, LCSW Clinical Social Worker    Expected Discharge Plan: [place] Barriers to Discharge: Continued Medical Work up   Patient Goals and CMS Choice        Expected Discharge Plan and Services Expected Discharge Plan: [place] In-house Referral: Clinical Social Work     Living arrangements for the past 2 months: [place]                                      Prior Living Arrangements/Services Living arrangements for the past 2 months: Single Family Home Lives with:: Self, Spouse Patient language and need for interpreter reviewed:: No        Need for  Family Participation in Patient Care: Yes (Comment)     Criminal Activity/Legal Involvement Pertinent to Current Situation/Hospitalization: No - Comment as needed  Activities of Daily Living Home Assistive Devices/Equipment: Walker (specify type), CBG Meter, Blood pressure cuff, Shower chair without back, Eyeglasses, Bedside commode/3-in-1 ADL Screening (condition at time of admission) Patient's cognitive ability adequate to safely complete daily activities?: Yes Is the patient deaf or have difficulty hearing?: No Does the patient have difficulty seeing, even when wearing glasses/contacts?: No Does the patient have difficulty concentrating, remembering, or making decisions?: No Patient able to express need for assistance with ADLs?: Yes Does the patient have difficulty dressing or bathing?: No Independently performs ADLs?: Yes (appropriate for developmental age) Does the patient have difficulty walking or climbing stairs?: Yes Weakness of Legs: Both Weakness of Arms/Hands: None  Permission Sought/Granted Permission sought to share information with : Family Supports Permission granted to share information with : Yes, Verbal Permission Granted  Share Information with NAME: [name]  Permission granted to share info w AGENCY: SNF's  Permission granted to share info w Relationship: spouse  Permission granted to share info w Contact Information: [phone]  Emotional Assessment Appearance:: Appears stated age Attitude/Demeanor/Rapport: Engaged, Self-Confident Affect (typically observed): Accepting, Pleasant, Appropriate, Anxious Orientation: : Oriented to Self, Oriented to  Place, Oriented to  Time, Oriented to Situation Alcohol / Substance Use: Not Applicable Psych Involvement: No (comment)  Admission diagnosis:  Foot osteomyelitis ([place]) [M86.9] Cellulitis of right foot [L03.115] Sepsis ([place]) [A41.9] Osteomyelitis of right foot, unspecified type ([place]) [M86.9] Sepsis without  acute organ dysfunction, due to unspecified organism [facility]) [A41.9] Patient Active Problem List   Diagnosis Date Noted   Bacteremia due to Enterococcus 05/28/2021   Foot osteomyelitis ([place]) 05/26/2021   Sepsis ([place]) 05/26/2021   Digestive disorder 05/18/2021   Bilateral lower extremity edema 05/02/2021   Statin myopathy 03/04/2021   Aortic stenosis 03/04/2021   Sjogren's syndrome ([place]) 02/11/2020   Acute hematogenous osteomyelitis of right foot ([place]) 01/28/2020   Cutaneous abscess of right foot    Subacute osteomyelitis, right ankle and foot ([place])    Statin intolerance 08/16/2019   Gram-negative bacteremia 10/18/2018   Streptococcal bacteremia 10/18/2018   Ulcer of lower extremity, limited to breakdown of skin ([place]) 10/03/2018   CAD S/P percutaneous coronary angioplasty 04/19/2018   Essential hypertension    Moderate persistent asthma 03/21/2018   NAFLD (nonalcoholic fatty liver disease)    Recurrent cellulitis of lower extremity 01/02/2018   Cellulitis of lower leg 01/02/2018   Chronic diarrhea 05/08/2017   H/O Clostridium difficile infection 05/08/2017   Rectal bleeding 05/08/2017   Hyperbilirubinemia 04/29/2016   Hyponatremia 04/29/2016   Cellulitis of right foot 03/09/2016   Mild persistent asthma 02/15/2016   Allergic rhinitis due to pollen 02/15/2016   Anaphylactic reaction due to food 02/10/2016   Diabetes mellitus type 2 in obese ([place]) 02/10/2016   Gastroesophageal reflux disease without esophagitis 02/10/2016   Atopic eczema 02/10/2016   Cervical nerve root disorder 12/15/2015   Lumbar radiculopathy 12/15/2015   Daytime somnolence 11/26/2015   Venous stasis dermatitis of both lower extremities 02/11/2015   Morbid obesity ([place]) 06/19/2014   B-complex deficiency 03/26/2014   Benign essential HTN 03/26/2014   Chronic pain associated with significant psychosocial dysfunction 03/26/2014   Diaphragmatic hernia 03/26/2014   Gastroesophageal reflux disease 03/26/2014    Hammer toe 03/26/2014   H/O neoplasm 03/26/2014   Adaptive colitis 03/26/2014   Diabetic polyneuropathy ([place]) 03/26/2014   Deafness, sensorineural 03/26/2014   Fibromyalgia 01/20/2014   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/20/2014   Hyperlipidemia 11/11/2012   Mixed connective tissue disease ([place]) 11/11/2012   Hypothyroidism 53/64/6803   Uncomplicated asthma 21/22/4825   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[place]) 02/20/2012   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

## 2021-05-29 NOTE — Progress Notes (Addendum)
PROGRESS NOTE        PATIENT DETAILS Name: [name] Age: 60 y.o. Sex: female Date of Birth: 12-04-1960 Admit Date: 05/26/2021 Admitting Physician Eduard Clos, MD [id number], Jilda Roche, DO  Brief Narrative: Patient is a 60 y.o. female with history of DM-2, CAD, HTN, MCTD, chronic right foot ulcer (refused BKA in the past as wanted to try hyperbaric therapy)-presenting with fever/RLE erythema-found to have sepsis and Enterococcus bacteremia.  Subjective: Lying comfortably in bed-RLE still erythematous but less compared to the past few days.  Agreeable to proceed with BKA.  Objective: Vitals: Blood pressure (!) 120/53, pulse 79, temperature 98 F (36.7 C), temperature source Oral, resp. rate 16, height 5\' 2"  (1.575 m), weight (!) 149.7 kg, SpO2 95 %.   Exam: Gen Exam:Alert awake-not in any distress HEENT:atraumatic, normocephalic Chest: B/L clear to auscultation anteriorly CVS:S1S2 regular Abdomen:soft non tender, non distended Extremities: Less RLE erythema today. Neurology: Non focal Skin: no rash   Pertinent Labs/Radiology: WBC: 6.4 Hb: 10.3 Creatinine: 1.15  Microbiology: 9/15>>Blood culture: 1/2: Enterococcus faecalis 9/17>> blood culture: No growth  Radiology/Echo: 9/15>> CT right foot: Chronic osteomyelitis at third metatarsal fracture site-soft tissue swelling consistent with cellulitis. 9/18>> TTE: Pending   Assessment/Plan: Sepsis due to RLE cellulitis/diabetic foot with Enterococcus bacteremia: Sepsis physiology has resolved-antibiotics narrowed to Unasyn.  Repeat blood cultures negative.  Await TTE.   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  Dr. Lajoyce Corners to follow tomorrow.    HTN: BP stable-continue Imdur, Avapro, Cardizem-follow and adjust.  Chronic pain syndrome: At baseline-continue as needed  oxycodone and Neurontin  CAD-s/p RCA PCI on 2019: No anginal symptoms-continue ASA  DM-2 (A1c 7.3 on 9/16): CBGs on the higher side-on [place] R 500 sliding scale as outpatient-increase Lantus to 26 units daily, increase Premeal NovoLog to 10 units-continue SSI-reassess on 9/19.    Recent Labs    05/28/21 2329 05/29/21 0348 05/29/21 0809  GLUCAP 262* 235* 270*     HLD: Continue Zetia  MCTD: Continue Plaquenil  Hypothyroidism: Continue Synthroid  GERD: Continue PPI  Restless leg syndrome: Continue Mirapex  Bronchial asthma: Not in exacerbation-continue bronchodilators  Morbid Obesity: Estimated body mass index is 60.36 kg/m as calculated from the following:   Height as of this encounter: 5\' 2"  (1.575 m).   Weight as of this encounter: 149.7 kg.    Procedures :None Consults: DVT Prophylaxis: Lovenox Code Status:Full code  Family Communication:Spouse at bedside  Time spent: 35 minutes-Greater than 50% of this time was spent in counseling, explanation of diagnosis, planning of further management, and coordination of care.  Diet: Diet Order             Diet heart healthy/carb modified Room service appropriate? Yes; Fluid consistency: Thin  Diet effective now                      Disposition Plan: Status is: Inpatient  Remains inpatient appropriate because:Inpatient level of care appropriate due to severity of illness  Dispo: The patient is from: Home              Anticipated d/c is to: Home              Patient currently is not medically stable to  d/c.   Difficult to place patient No    Barriers to Discharge: Enterococcal bacteremia-RLE-chronic osteomyelitis of right foot-needs BKA.  Antimicrobial agents: Anti-infectives (From admission, onward)    Start     Dose/Rate Route Frequency Ordered Stop   05/28/21 1630  Ampicillin-Sulbactam (UNASYN) 3 g in sodium chloride 0.9 % 100 mL IVPB        3 g 200 mL/hr over 30 Minutes Intravenous Every 6 hours  05/28/21 0946     05/27/21 2200  vancomycin (VANCOREADY) IVPB 1750 mg/350 mL  Status:  Discontinued        1,750 mg 175 mL/hr over 120 Minutes Intravenous Every 24 hours 05/27/21 0759 05/28/21 0849   05/27/21 2200  hydroxychloroquine (PLAQUENIL) tablet 200 mg        200 mg Oral 2 times daily 05/27/21 2004     05/27/21 1800  vancomycin (VANCOREADY) IVPB 1500 mg/300 mL  Status:  Discontinued        1,500 mg 150 mL/hr over 120 Minutes Intravenous Every 24 hours 05/26/21 1812 05/27/21 0757   05/27/21 0800  ceFEPIme (MAXIPIME) 2 g in sodium chloride 0.9 % 100 mL IVPB  Status:  Discontinued        2 g 200 mL/hr over 30 Minutes Intravenous Every 8 hours 05/27/21 0749 05/28/21 0849   05/27/21 0800  vancomycin (VANCOCIN) IVPB 1000 mg/200 mL premix        1,000 mg 200 mL/hr over 60 Minutes Intravenous  Once 05/27/21 0757 05/27/21 0948   05/26/21 2200  ceFEPIme (MAXIPIME) 2 g in sodium chloride 0.9 % 100 mL IVPB  Status:  Discontinued        2 g 200 mL/hr over 30 Minutes Intravenous Every 8 hours 05/26/21 1812 05/27/21 0749   05/26/21 1815  vancomycin (VANCOCIN) IVPB 1000 mg/200 mL premix        1,000 mg 200 mL/hr over 60 Minutes Intravenous Every hour 05/26/21 1812 05/26/21 2014   05/26/21 1745  vancomycin (VANCOCIN) 2,500 mg in sodium chloride 0.9 % 500 mL IVPB  Status:  Discontinued        2,500 mg 250 mL/hr over 120 Minutes Intravenous  Once 05/26/21 1741 05/26/21 1812        MEDICATIONS: Scheduled Meds:  acetaminophen  650 mg Oral Q6H   aspirin EC  81 mg Oral Daily   diltiazem  240 mg Oral Daily   enoxaparin (LOVENOX) injection  0.5 mg/kg Subcutaneous Daily   ezetimibe  10 mg Oral Daily   gabapentin  400 mg Oral BID WC   gabapentin  800 mg Oral QHS   hydroxychloroquine  200 mg Oral BID   insulin aspart  0-20 Units Subcutaneous TID WC   insulin aspart  0-5 Units Subcutaneous QHS   insulin aspart  10 Units Subcutaneous TID WC   insulin glargine-yfgn  26 Units Subcutaneous Daily    irbesartan  150 mg Oral Daily   isosorbide mononitrate  30 mg Oral Daily   levothyroxine  200 mcg Oral QAC breakfast   loratadine  10 mg Oral QHS   mupirocin ointment   Topical TID   pramipexole  1 mg Oral BID   Vitamin D (Ergocalciferol)  50,000 Units Oral Q7 days   Continuous Infusions:  ampicillin-sulbactam (UNASYN) IV 3 g (05/29/21 0941)   PRN Meds:.acetaminophen **OR** acetaminophen, albuterol, cyclobenzaprine, fluticasone, magnesium oxide, nitroGLYCERIN, oxyCODONE, pantoprazole, perflutren lipid microspheres (DEFINITY) IV suspension, pramipexole, promethazine   I have personally reviewed following labs and imaging studies  LABORATORY  DATA: CBC: Recent Labs  Lab 05/26/21 1700 05/27/21 0645 05/28/21 0057 05/29/21 0052  WBC 20.9* 11.1* 7.1 6.4  NEUTROABS 19.0*  --   --   --   HGB 12.6 11.4* 10.4* 10.3*  HCT 40.7 36.6 35.2* 34.8*  MCV 82.4 82.2 85.0 85.3  PLT 278 230 231 229     Basic Metabolic Panel: Recent Labs  Lab 05/26/21 1700 05/27/21 0645 05/28/21 0057 05/29/21 0052  NA 136 136 135 136  K 4.9 3.7 3.7 3.7  CL 98 101 97* 98  CO2 24 26 27 27   GLUCOSE 207* 232* 236* 279*  BUN 9 9 8 10   CREATININE 1.05* 0.90 1.00 1.15*  CALCIUM 9.4 8.7* 8.8* 8.6*     GFR: Estimated Creatinine Clearance: 74.8 mL/min (A) (by C-G formula based on SCr of 1.15 mg/dL (H)).  Liver Function Tests: Recent Labs  Lab 05/26/21 1700 05/28/21 0057  AST 42* 76*  ALT 33 36  ALKPHOS 48 37*  BILITOT 0.7 1.1  PROT 7.8 6.1*  ALBUMIN 3.9 2.9*    No results for input(s): LIPASE, AMYLASE in the last 168 hours. No results for input(s): AMMONIA in the last 168 hours.  Coagulation Profile: Recent Labs  Lab 05/26/21 1745  INR 1.1     Cardiac Enzymes: No results for input(s): CKTOTAL, CKMB, CKMBINDEX, TROPONINI in the last 168 hours.  BNP (last 3 results) No results for input(s): PROBNP in the last 8760 hours.  Lipid Profile: No results for input(s): CHOL, HDL, LDLCALC,  TRIG, CHOLHDL, LDLDIRECT in the last 72 hours.  Thyroid Function Tests: No results for input(s): TSH, T4TOTAL, FREET4, T3FREE, THYROIDAB in the last 72 hours.  Anemia Panel: No results for input(s): VITAMINB12, FOLATE, FERRITIN, TIBC, IRON, RETICCTPCT in the last 72 hours.  Urine analysis:    Component Value Date/Time   COLORURINE YELLOW 02/03/2020 1630   APPEARANCEUR CLEAR 02/03/2020 1630   LABSPEC 1.008 02/03/2020 1630   PHURINE 5.0 02/03/2020 1630   GLUCOSEU NEGATIVE 02/03/2020 1630   HGBUR NEGATIVE 02/03/2020 1630   BILIRUBINUR NEGATIVE 02/03/2020 1630   KETONESUR NEGATIVE 02/03/2020 1630   PROTEINUR NEGATIVE 02/03/2020 1630   UROBILINOGEN 0.2 02/10/2015 1835   NITRITE NEGATIVE 02/03/2020 1630   LEUKOCYTESUR NEGATIVE 02/03/2020 1630    Sepsis Labs: Lactic Acid, Venous    Component Value Date/Time   LATICACIDVEN 2.1 (HH) 05/27/2021 0643    MICROBIOLOGY: Recent Results (from the past 240 hour(s))  Blood Culture (routine x 2)     Status: Abnormal   Collection Time: 05/26/21  5:00 PM   Specimen: Left Antecubital; Blood  Result Value Ref Range Status   Specimen Description   Final    LEFT ANTECUBITAL Performed at [facility], [address] [place], [facility] [place]    Special Requests   Final    BOTTLES DRAWN AEROBIC AND ANAEROBIC Blood Culture adequate volume Performed at [facility], [address] [place], [address] [place]    Culture  Setup Time   Final    GRAM POSITIVE COCCI IN CHAINS AEROBIC BOTTLE ONLY CRITICAL RESULT CALLED TO, READ BACK BY AND VERIFIED WITH[facility] Celedonio Miyamoto 05/27/21 A BROWNING Performed at [facility], [address] [place], [address] [place]    Culture ENTEROCOCCUS FAECALIS (A)  Final   Report Status 05/29/2021 FINAL  Final   Organism ID, Bacteria ENTEROCOCCUS FAECALIS  Final      Susceptibility   Enterococcus faecalis - MIC*  AMPICILLIN <=2 SENSITIVE Sensitive     VANCOMYCIN 1 SENSITIVE  Sensitive     GENTAMICIN SYNERGY SENSITIVE Sensitive     * ENTEROCOCCUS FAECALIS  Blood Culture (routine x 2)     Status: None (Preliminary result)   Collection Time: 05/26/21  5:00 PM   Specimen: Left Antecubital; Blood  Result Value Ref Range Status   Specimen Description   Final    LEFT ANTECUBITAL Performed at [facility], [address] [place], [place] [address]    Special Requests   Final    BOTTLES DRAWN AEROBIC AND ANAEROBIC Blood Culture adequate volume Performed at [facility], [address] [place], [place] [address]    Culture   Final    NO GROWTH 3 DAYS Performed at [facility], [address], [place], [place] [address]    Report Status PENDING  Incomplete  Resp Panel by RT-PCR (Flu A&B, Covid) Nasopharyngeal Swab     Status: None   Collection Time: 05/26/21  5:00 PM   Specimen: Nasopharyngeal Swab; Nasopharyngeal(NP) swabs in vial transport medium  Result Value Ref Range Status   SARS Coronavirus 2 by RT PCR NEGATIVE NEGATIVE Final    Comment: (NOTE) SARS-CoV-2 target nucleic acids are NOT DETECTED.  The SARS-CoV-2 RNA is generally detectable in upper respiratory specimens during the acute phase of infection. The lowest concentration of SARS-CoV-2 viral copies this assay can detect is 138 copies/mL. A negative result does not preclude SARS-Cov-2 infection and should not be used as the sole basis for treatment or other patient management decisions. A negative result may occur with  improper specimen collection/handling, submission of specimen other than nasopharyngeal swab, presence of viral mutation(s) within the areas targeted by this assay, and inadequate number of viral copies(<138 copies/mL). A negative result must be combined with clinical observations, patient history, and epidemiological information. The expected result is Negative.  Fact Sheet for Patients:  BloggerCourse.com  Fact Sheet for  Healthcare Providers:  SeriousBroker.it  This test is no t yet approved or cleared by the [place] FDA and  has been authorized for detection and/or diagnosis of SARS-CoV-2 by FDA under an Emergency Use Authorization (EUA). This EUA will remain  in effect (meaning this test can be used) for the duration of the COVID-19 declaration under Section 564(b)(1) of the Act, 21 U.S.C.section 360bbb-3(b)(1), unless the authorization is terminated  or revoked sooner.       Influenza A by PCR NEGATIVE NEGATIVE Final   Influenza B by PCR NEGATIVE NEGATIVE Final    Comment: (NOTE) The Xpert Xpress SARS-CoV-2/FLU/RSV plus assay is intended as an aid in the diagnosis of influenza from Nasopharyngeal swab specimens and should not be used as a sole basis for treatment. Nasal washings and aspirates are unacceptable for Xpert Xpress SARS-CoV-2/FLU/RSV testing.  Fact Sheet for Patients: BloggerCourse.com  Fact Sheet for Healthcare Providers: SeriousBroker.it  This test is not yet approved or cleared by the [place] FDA and has been authorized for detection and/or diagnosis of SARS-CoV-2 by FDA under an Emergency Use Authorization (EUA). This EUA will remain in effect (meaning this test can be used) for the duration of the COVID-19 declaration under Section 564(b)(1) of the Act, 21 U.S.C. section 360bbb-3(b)(1), unless the authorization is terminated or revoked.  Performed at [facility], [address] [place], [place] [address]   Blood Culture ID Panel (Reflexed)     Status: Abnormal   Collection Time:  05/26/21  5:00 PM  Result Value Ref Range Status   Enterococcus faecalis DETECTED (A) NOT DETECTED Final    Comment: CRITICAL RESULT CALLED TO, READ BACK BY AND VERIFIED WITH: E BREWINGTON PHARMD 1624 05/27/21 A BROWNING    Enterococcus Faecium NOT DETECTED NOT DETECTED Final   Listeria  monocytogenes NOT DETECTED NOT DETECTED Final   Staphylococcus species NOT DETECTED NOT DETECTED Final   Staphylococcus aureus (BCID) NOT DETECTED NOT DETECTED Final   Staphylococcus epidermidis NOT DETECTED NOT DETECTED Final   Staphylococcus lugdunensis NOT DETECTED NOT DETECTED Final   Streptococcus species NOT DETECTED NOT DETECTED Final   Streptococcus agalactiae NOT DETECTED NOT DETECTED Final   Streptococcus pneumoniae NOT DETECTED NOT DETECTED Final   Streptococcus pyogenes NOT DETECTED NOT DETECTED Final   A.calcoaceticus-baumannii NOT DETECTED NOT DETECTED Final   Bacteroides fragilis NOT DETECTED NOT DETECTED Final   Enterobacterales NOT DETECTED NOT DETECTED Final   Enterobacter cloacae complex NOT DETECTED NOT DETECTED Final   Escherichia coli NOT DETECTED NOT DETECTED Final   Klebsiella aerogenes NOT DETECTED NOT DETECTED Final   Klebsiella oxytoca NOT DETECTED NOT DETECTED Final   Klebsiella pneumoniae NOT DETECTED NOT DETECTED Final   Proteus species NOT DETECTED NOT DETECTED Final   Salmonella species NOT DETECTED NOT DETECTED Final   Serratia marcescens NOT DETECTED NOT DETECTED Final   Haemophilus influenzae NOT DETECTED NOT DETECTED Final   Neisseria meningitidis NOT DETECTED NOT DETECTED Final   Pseudomonas aeruginosa NOT DETECTED NOT DETECTED Final   Stenotrophomonas maltophilia NOT DETECTED NOT DETECTED Final   Candida albicans NOT DETECTED NOT DETECTED Final   Candida auris NOT DETECTED NOT DETECTED Final   Candida glabrata NOT DETECTED NOT DETECTED Final   Candida krusei NOT DETECTED NOT DETECTED Final   Candida parapsilosis NOT DETECTED NOT DETECTED Final   Candida tropicalis NOT DETECTED NOT DETECTED Final   Cryptococcus neoformans/gattii NOT DETECTED NOT DETECTED Final   Vancomycin resistance NOT DETECTED NOT DETECTED Final    Comment: Performed at [facility], [address] [place], [place] [address]  Culture, blood (routine x 2)     Status: None  (Preliminary result)   Collection Time: 05/28/21 12:57 AM   Specimen: BLOOD RIGHT HAND  Result Value Ref Range Status   Specimen Description BLOOD RIGHT HAND  Final   Special Requests   Final    BOTTLES DRAWN AEROBIC AND ANAEROBIC Blood Culture adequate volume   Culture   Final    NO GROWTH 1 DAY Performed at [facility], [address] [place], [place] [address]    Report Status PENDING  Incomplete  Culture, blood (routine x 2)     Status: None (Preliminary result)   Collection Time: 05/28/21 12:57 AM   Specimen: BLOOD  Result Value Ref Range Status   Specimen Description BLOOD RIGHT ANTECUBITAL  Final   Special Requests   Final    BOTTLES DRAWN AEROBIC AND ANAEROBIC Blood Culture adequate volume   Culture   Final    NO GROWTH 1 DAY Performed at [facility], [address] [place], [place] [address]    Report Status PENDING  Incomplete    RADIOLOGY STUDIES/RESULTS: No results found.   LOS: 2 days   Jeoffrey Massed, MD  [facility]    To contact the attending provider between 7A-7P or the covering provider during after hours 7P-7A, please log into the web site [email or web address] and access using universal [place] password for that  web site. If you do not have the password, please call the hospital operator.  05/29/2021, 10:17 AM

## 2021-05-30 ENCOUNTER — Encounter (HOSPITAL_BASED_OUTPATIENT_CLINIC_OR_DEPARTMENT_OTHER): Payer: 59 | Admitting: Internal Medicine

## 2021-05-30 ENCOUNTER — Other Ambulatory Visit: Payer: Self-pay | Admitting: Physician Assistant

## 2021-05-30 DIAGNOSIS — R7881 Bacteremia: Secondary | ICD-10-CM | POA: Diagnosis not present

## 2021-05-30 DIAGNOSIS — E669 Obesity, unspecified: Secondary | ICD-10-CM | POA: Diagnosis not present

## 2021-05-30 DIAGNOSIS — E1169 Type 2 diabetes mellitus with other specified complication: Secondary | ICD-10-CM | POA: Diagnosis not present

## 2021-05-30 DIAGNOSIS — G894 Chronic pain syndrome: Secondary | ICD-10-CM | POA: Diagnosis not present

## 2021-05-30 DIAGNOSIS — M86271 Subacute osteomyelitis, right ankle and foot: Secondary | ICD-10-CM | POA: Diagnosis not present

## 2021-05-30 DIAGNOSIS — I2510 Atherosclerotic heart disease of native coronary artery without angina pectoris: Secondary | ICD-10-CM | POA: Diagnosis not present

## 2021-05-30 DIAGNOSIS — I10 Essential (primary) hypertension: Secondary | ICD-10-CM | POA: Diagnosis not present

## 2021-05-30 DIAGNOSIS — L03115 Cellulitis of right lower limb: Secondary | ICD-10-CM | POA: Diagnosis not present

## 2021-05-30 LAB — GLUCOSE, CAPILLARY
Glucose-Capillary: 246 mg/dL — ABNORMAL HIGH (ref 70–99)
Glucose-Capillary: 267 mg/dL — ABNORMAL HIGH (ref 70–99)
Glucose-Capillary: 178 mg/dL — ABNORMAL HIGH (ref 70–99)
Glucose-Capillary: 266 mg/dL — ABNORMAL HIGH (ref 70–99)

## 2021-05-30 MED ORDER — GLUCERNA SHAKE PO LIQD
237.0000 mL | Freq: Three times a day (TID) | ORAL | Status: DC
  Administered 2021-05-30 – 2021-06-07 (×19): 237 mL via ORAL

## 2021-05-30 MED ORDER — ENSURE ENLIVE PO LIQD: 237.0000 mL | Freq: Two times a day (BID) | ORAL | Status: DC

## 2021-05-30 MED ORDER — ADULT MULTIVITAMIN W/MINERALS CH
1.0000 | ORAL_TABLET | Freq: Every day | ORAL | Status: DC
  Administered 2021-05-30 – 2021-06-08 (×10): 1 via ORAL
  Filled 2021-05-30 (×10): qty 1

## 2021-05-30 MED ORDER — SACCHAROMYCES BOULARDII 250 MG PO CAPS
250.0000 mg | ORAL_CAPSULE | Freq: Two times a day (BID) | ORAL | Status: DC
  Administered 2021-05-30 – 2021-06-08 (×20): 250 mg via ORAL
  Filled 2021-05-30 (×20): qty 1

## 2021-05-30 MED ORDER — INSULIN ASPART 100 UNIT/ML IJ SOLN
12.0000 [IU] | Freq: Three times a day (TID) | INTRAMUSCULAR | Status: DC
  Administered 2021-05-30 (×2): 12 [IU] via SUBCUTANEOUS

## 2021-05-30 MED ORDER — INSULIN GLARGINE-YFGN 100 UNIT/ML ~~LOC~~ SOLN
32.0000 [IU] | Freq: Every day | SUBCUTANEOUS | Status: DC
  Administered 2021-05-30: 32 [IU] via SUBCUTANEOUS
  Filled 2021-05-30 (×2): qty 0.32

## 2021-05-30 NOTE — Progress Notes (Signed)
Patient ID: [name], female   DOB: 06-06-1961, 61 y.o.   MRN: [id number] Patient is seen in follow-up for her abscess ulceration osteomyelitis right foot status post foot salvage intervention with a fourth and fifth ray amputation.  Reviewed the treatment options and patient states she agrees to proceed with a transtibial amputation on the right we will set this up for Wednesday.  The hospital is out of Juleen I will order Ensure.  Patient states she wants to go home after surgery and will have therapy evaluate her.  Discussed that if insurance approves she may be a good candidate for inpatient rehab.

## 2021-05-30 NOTE — Progress Notes (Signed)
[facility]  Date of Admission:  05/26/2021           Reason for visit: Follow up on E faecalis bacteremia  Current antibiotics: Unasyn 9/17- present  Previous antibiotics: Vanc 9/15-9/17 Cefepime 9/15-9/17  ASSESSMENT:    60 y.o. 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  Planning for BKA on Wednesday with orthopedic surgery for definitive source control.  Repeat blood cultures remain no growth to date.  TTE was completed over the weekend however was not an adequate study. 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 difficile: 5 to 6 years ago per her report and she states that she does well with Florastor when on antibiotics. Severe sepsis: Secondary to #1 with sepsis physiology resolved.   RECOMMENDATIONS:    Continue Unasyn Follow-up cultures Her Denova score is approximately 2 which would make endocarditis unlikely, however, given the technically inadequate TTE would recommend pursuing TEE as determining whether or not she has endocarditis will significantly affect antibiotic management.  Discussed with patient and will reach out to cardiology. Wound care, glycemic control BKA planned for Wednesday Will add Florastor Will follow   Principal Problem:   Bacteremia due to Enterococcus Active Problems:   Hyperlipidemia   Mixed connective tissue disease (HCC)   Hypothyroidism   Uncomplicated asthma   Benign essential HTN   Chronic pain associated with significant psychosocial dysfunction   Gastroesophageal reflux disease   Diabetes mellitus type 2 in obese (HCC)   Cellulitis of right foot   CAD S/P percutaneous coronary angioplasty   Foot osteomyelitis (HCC)   Sepsis (HCC)    MEDICATIONS:    Scheduled Meds:  acetaminophen  650 mg Oral Q6H   aspirin EC  81 mg Oral Daily    diltiazem  240 mg Oral Daily   enoxaparin (LOVENOX) injection  0.5 mg/kg Subcutaneous Daily   ezetimibe  10 mg Oral Daily   feeding supplement (GLUCERNA SHAKE)  237 mL Oral TID BM   gabapentin  400 mg Oral BID WC   gabapentin  800 mg Oral QHS   hydroxychloroquine  200 mg Oral BID   insulin aspart  0-20 Units Subcutaneous TID WC   insulin aspart  0-5 Units Subcutaneous QHS   insulin aspart  12 Units Subcutaneous TID WC   insulin glargine-yfgn  32 Units Subcutaneous Daily   irbesartan  150 mg Oral Daily   isosorbide mononitrate  30 mg Oral Daily   levothyroxine  200 mcg Oral QAC breakfast   loratadine  10 mg Oral QHS   mupirocin ointment   Topical TID   pramipexole  1 mg Oral BID   saccharomyces boulardii  250 mg Oral BID   Vitamin D (Ergocalciferol)  50,000 Units Oral Q7 days   Continuous Infusions:  ampicillin-sulbactam (UNASYN) IV 3 g (05/30/21 0915)   PRN Meds:.acetaminophen **OR** acetaminophen, albuterol, cyclobenzaprine, fluticasone, magnesium oxide, nitroGLYCERIN, oxyCODONE, pantoprazole, pramipexole, promethazine  SUBJECTIVE:   24 hour events:  Afebrile over the last 24 hours T-max 98.6 Agreeable to BKA per notes TTE done yesterday was a technically challenging study 9/17 blood cultures are no growth 9/15 cultures grew ampicillin sensitive Enterococcus faecalis in 1 out of 4 cultures WBC normalized yesterday Creatinine relatively stable She is currently on Unasyn which she is tolerating  She is doing okay this morning.  Tolerating Unasyn.  She reports a history of C. difficile 5 to 6 years ago and would like Florastor to help limit any symptoms.  She also feels her tongue tingling which may be a precursor to yeast infection.  Review of Systems  All other systems reviewed and are negative.    OBJECTIVE:   Blood pressure (!) 138/98, pulse 90, temperature 98.1 F (36.7 C), temperature source Oral, resp. rate 20, height 5\' 2"  (1.575 m), weight (!) 149.7 kg, SpO2 96  %. Body mass index is 60.36 kg/m.  Physical Exam Constitutional:      General: She is not in acute distress.    Appearance: Normal appearance.  HENT:     Head: Normocephalic and atraumatic.  Eyes:     Extraocular Movements: Extraocular movements intact.     Conjunctiva/sclera: Conjunctivae normal.  Pulmonary:     Effort: Pulmonary effort is normal. No respiratory distress.  Abdominal:     General: There is no distension.     Palpations: Abdomen is soft.  Musculoskeletal:     Cervical back: Normal range of motion and neck supple.     Right lower leg: Edema present.     Left lower leg: Edema present.  Skin:    General: Skin is warm and dry.     Findings: No rash.  Neurological:     General: No focal deficit present.     Mental Status: She is alert and oriented to person, place, and time.  Psychiatric:        Mood and Affect: Mood normal.        Behavior: Behavior normal.     Lab Results: Lab Results  Component Value Date   WBC 6.4 05/29/2021   HGB 10.3 (L) 05/29/2021   HCT 34.8 (L) 05/29/2021   MCV 85.3 05/29/2021   PLT 229 05/29/2021    Lab Results  Component Value Date   NA 136 05/29/2021   K 3.7 05/29/2021   CO2 27 05/29/2021   GLUCOSE 279 (H) 05/29/2021   BUN 10 05/29/2021   CREATININE 1.15 (H) 05/29/2021   CALCIUM 8.6 (L) 05/29/2021   GFRNONAA 55 (L) 05/29/2021   GFRAA 87 05/04/2020    Lab Results  Component Value Date   ALT 36 05/28/2021   AST 76 (H) 05/28/2021   ALKPHOS 37 (L) 05/28/2021   BILITOT 1.1 05/28/2021       Component Value Date/Time   CRP 9.8 (H) 01/02/2018 1809       Component Value Date/Time   ESRSEDRATE 37 (H) 01/02/2018 1809     I have reviewed the micro and lab results in Epic.  Imaging: ECHOCARDIOGRAM COMPLETE  Result Date: 05/29/2021    ECHOCARDIOGRAM REPORT   Patient Name:   [name] Date of Exam: 05/29/2021 Medical Rec #:  05/31/2021      Height:       62.0 in Accession #:    [id number]     Weight:       330.0  lb Date of Birth:  29-May-1961     BSA:          2.365 m Patient Age:    59 years       BP:           120/53 mmHg Patient Gender: F              HR:           89 bpm. Exam Location:  Inpatient Procedure: 2D Echo, Cardiac Doppler, Color  Doppler and Intracardiac            Opacification Agent Indications:    Bacteremia  History:        Patient has prior history of Echocardiogram examinations, most                 recent 03/04/2021. Aortic Valve Disease, Arrythmias:Atrial                 Fibrillation; Risk Factors:Hypertension, Diabetes and                 Dyslipidemia.  Sonographer:    Ross Ludwig RDCS (AE) Referring Phys: [id number] [facility] T VU  Sonographer Comments: Technically challenging study due to limited acoustic windows, Technically difficult study due to poor echo windows, suboptimal parasternal window, suboptimal apical window, suboptimal subcostal window and patient is morbidly obese.  Image acquisition challenging due to patient body habitus. IMPRESSIONS  1. An aortic valve gradient was measured. The valve cannot be seen well enough to make any further comments.  2. Technically difficult echo with poor image quality. Using Definity contrast, the LV systolic function can be seen to be well preserved.  3. Left ventricular ejection fraction, by estimation, is 55 to 60%. The left ventricle has normal function. Left ventricular endocardial border not optimally defined to evaluate regional wall motion. Left ventricular diastolic function could not be evaluated.  4. Right ventricular systolic function was not well visualized. The right ventricular size is not well visualized.  5. The mitral valve was not well visualized. No evidence of mitral valve regurgitation.  6. The aortic valve was not well visualized. Aortic valve regurgitation is not visualized. Mild to moderate aortic valve stenosis. FINDINGS  Left Ventricle: Left ventricular ejection fraction, by estimation, is 55 to 60%. The left ventricle has normal  function. Left ventricular endocardial border not optimally defined to evaluate regional wall motion. Definity contrast agent was given IV to delineate the left ventricular endocardial borders. The left ventricular internal cavity size was normal in size. There is no left ventricular hypertrophy. Left ventricular diastolic function could not be evaluated. Right Ventricle: The right ventricular size is not well visualized. Right vetricular wall thickness was not well visualized. Right ventricular systolic function was not well visualized. Left Atrium: Left atrial size was not well visualized. Right Atrium: Right atrial size was not well visualized. Pericardium: The pericardium was not well visualized. Mitral Valve: The mitral valve was not well visualized. No evidence of mitral valve regurgitation. Tricuspid Valve: The tricuspid valve is not well visualized. Tricuspid valve regurgitation is not demonstrated. Aortic Valve: The aortic valve was not well visualized. Aortic valve regurgitation is not visualized. Mild to moderate aortic stenosis is present. Aortic valve mean gradient measures 16.0 mmHg. Aortic valve peak gradient measures 32.3 mmHg. Aortic valve area, by VTI measures 1.83 cm. Pulmonic Valve: The pulmonic valve was not well visualized. Pulmonic valve regurgitation is not visualized. Aorta: The aortic root was not well visualized. IAS/Shunts: The interatrial septum was not well visualized. Additional Comments: Technically difficult echo with poor image quality. Using Definity contrast, the LV systolic function can be seen to be well preserved. An aortic valve gradient was measured. The valve cannot be seen well enough to make any further comments.  LEFT VENTRICLE PLAX 2D LVIDd:         5.40 cm  Diastology LVIDs:         3.50 cm  LV e' medial:    14.70 cm/s LV PW:  1.70 cm  LV E/e' medial:  8.0 LV IVS:        1.30 cm  LV e' lateral:   10.50 cm/s LVOT diam:     2.20 cm  LV E/e' lateral: 11.1 LV SV:          94 LV SV Index:   40 LVOT Area:     3.80 cm  IVC IVC diam: 1.90 cm LEFT ATRIUM           Index LA diam:      2.80 cm 1.18 cm/m LA Vol (A4C): 47.1 ml 19.91 ml/m  AORTIC VALVE AV Area (Vmax):    2.01 cm AV Area (Vmean):   2.09 cm AV Area (VTI):     1.83 cm AV Vmax:           284.00 cm/s AV Vmean:          189.000 cm/s AV VTI:            0.513 m AV Peak Grad:      32.3 mmHg AV Mean Grad:      16.0 mmHg LVOT Vmax:         150.00 cm/s LVOT Vmean:        104.000 cm/s LVOT VTI:          0.247 m LVOT/AV VTI ratio: 0.48  AORTA Ao Root diam: 3.10 cm MITRAL VALVE MV Area (PHT): 4.63 cm     SHUNTS MV Decel Time: 164 msec     Systemic VTI:  0.25 m MV E velocity: 117.00 cm/s  Systemic Diam: 2.20 cm MV A velocity: 108.00 cm/s MV E/A ratio:  1.08 Kristeen Miss MD Electronically signed by Kristeen Miss MD Signature Date/Time: 05/29/2021/11:46:02 AM    Final      Imaging independently reviewed in Epic.    Vedia Coffer for Infectious Disease [facility] [phone] pager 05/30/2021, 11:07 AM  I spent greater than 35 minutes with the patient including greater than 50% of time in face to face counsel of the patient and in coordination of their care.

## 2021-05-30 NOTE — Plan of Care (Signed)
  Problem: Education: Goal: Knowledge of General Education information will improve Description Including pain rating scale, medication(s)/side effects and non-pharmacologic comfort measures Outcome: Progressing   Problem: Health Behavior/Discharge Planning: Goal: Ability to manage health-related needs will improve Outcome: Progressing   

## 2021-05-30 NOTE — H&P (View-Only) (Signed)
Patient ID: [name], female   DOB: 06/11/1961, 59 y.o.   MRN: [id number] Patient is seen in follow-up for her abscess ulceration osteomyelitis right foot status post foot salvage intervention with a fourth and fifth ray amputation.  Reviewed the treatment options and patient states she agrees to proceed with a transtibial amputation on the right we will set this up for Wednesday.  The hospital is out of Juleen I will order Ensure.  Patient states she wants to go home after surgery and will have therapy evaluate her.  Discussed that if insurance approves she may be a good candidate for inpatient rehab. 

## 2021-05-30 NOTE — Progress Notes (Signed)
Inpatient Diabetes Program Recommendations  AACE/ADA: New Consensus Statement on Inpatient Glycemic Control (2015)  Target Ranges:  Prepandial:   less than 140 mg/dL      Peak postprandial:   less than 180 mg/dL (1-2 hours)      Critically ill patients:  140 - 180 mg/dL   Lab Results  Component Value Date   GLUCAP 267 (H) 05/30/2021   HGBA1C 7.3 (H) 05/27/2021    Review of Glycemic Control Results for [name], [name] (MRN [id number]) as of 05/30/2021 12:21  Ref. Range 05/29/2021 08:09 05/29/2021 12:09 05/29/2021 16:34 05/29/2021 19:54 05/30/2021 08:27 05/30/2021 11:57  Glucose-Capillary Latest Ref Range: 70 - 99 mg/dL 935 (H) 701 (H) 779 (H) 242 (H) 246 (H) 267 (H)   Diabetes history: DM 2 Outpatient Diabetes medications: Humulin RU-500 insulin 40-150 units tid based on SSI, Metformin 1000 mg bid Current orders for Inpatient glycemic control:  Semglee 32 units Novolog 0-20 units tid + hs Novolog 12 units tid meal coverage  A1c 7.3% on 9/16  Inpatient Diabetes Program Recommendations:    Pt on Concentrated U-500 at home.  - May consider switching insulins to  Humulin R U-500 40 units tid (the lower end of her sliding scale) - may also have Novolog Correction scale - All other orders d/c'd  Thanks,  Christena Deem RN, MSN, BC-ADM Inpatient Diabetes Coordinator Team Pager [phone] (8a-5p)

## 2021-05-30 NOTE — Progress Notes (Signed)
Initial Nutrition Assessment  DOCUMENTATION CODES:   Morbid obesity  INTERVENTION:   -Magic cup TID with meals, each supplement provides 290 kcal and 9 grams of protein  -Double protein portions with meals -MVI with minerals daily  NUTRITION DIAGNOSIS:   Increased nutrient needs related to wound healing as evidenced by estimated needs.  GOAL:   Patient will meet greater than or equal to 90% of their needs  MONITOR:   PO intake, Supplement acceptance, Labs, Weight trends, Skin, I & O's  REASON FOR ASSESSMENT:   Consult Assessment of nutrition requirement/status  ASSESSMENT:   Patient is a 60 y.o. female with history of DM-2, CAD, HTN, MCTD, chronic right foot ulcer (refused BKA in the past as wanted to try hyperbaric therapy)-presenting with fever/RLE erythema-found to have sepsis and Enterococcus bacteremia.  Pt admitted with sepsis due to RLE cellulitis/ DM foot infection with enterococcus bacteremia.   Reviewed I/O's: +640 ml x 24 hours and +4.1 L since admission   Per orthopedics notes, plan for rt BKA on Wednesday, 06/01/21.  Pt sleeping soundly at time of visit and did not repsond to voice or touch.   Pt with good appetite. Meal completion 100%.  Pt with increased nutritional needs for wound healing and would benefit from addition of oral nutrition supplements.   Reviewed wt hx; wt has been stable over the past year.   Obesity is a complex, chronic medical condition that is optimally managed by a multidisciplinary care team. Weight loss is not an ideal goal for an acute inpatient hospitalization. However, if further work-up for obesity is warranted, consider outpatient referral to outpatient bariatric service and/or [place] Nutrition and Diabetes Education Services.    Medications reviewed and include cardizem, florastor, and vitamin D.   Lab Results  Component Value Date   HGBA1C 7.3 (H) 05/27/2021   PTA DM medications are 1000 mg metformin BID and  40-150 Humulin U-500 TID.  Labs reviewed: CBGS: 178-267 (inpatient orders for glycemic control are 0-20 units insulin aspart TID with meals, 0-5 units insulin aspart daily at bedtime, 12 units insulin aspart TID with meals, and 32 units inuslin glargine-yfgn daily).    NUTRITION - FOCUSED PHYSICAL EXAM:  Flowsheet Row Most Recent Value  Orbital Region No depletion  Upper Arm Region No depletion  Thoracic and Lumbar Region No depletion  Buccal Region No depletion  Temple Region No depletion  Clavicle Bone Region No depletion  Clavicle and Acromion Bone Region No depletion  Scapular Bone Region No depletion  Dorsal Hand No depletion  Patellar Region No depletion  Anterior Thigh Region No depletion  Posterior Calf Region No depletion  Edema (RD Assessment) Mild  Hair Reviewed  Eyes Reviewed  Mouth Reviewed  Skin Reviewed  Nails Reviewed       Diet Order:   Diet Order             Diet heart healthy/carb modified Room service appropriate? Yes; Fluid consistency: Thin  Diet effective now                   EDUCATION NEEDS:   No education needs have been identified at this time  Skin:  Skin Assessment: Skin Integrity Issues: Skin Integrity Issues:: Diabetic Ulcer Diabetic Ulcer: rt foot  Last BM:  05/30/21  Height:   Ht Readings from Last 1 Encounters:  05/27/21 5\' 2"  (1.575 m)    Weight:   Wt Readings from Last 1 Encounters:  05/26/21 (!) 149.7 kg  Ideal Body Weight:  50 kg  BMI:  Body mass index is 60.36 kg/m.  Estimated Nutritional Needs:   Kcal:  1800-2000  Protein:  110-125 grams  Fluid:  > 1.8 L    Levada Schilling, RD, LDN, CDCES Registered Dietitian II Certified Diabetes Care and Education Specialist Please refer to [facility] for RD and/or RD on-call/weekend/after hours pager

## 2021-05-30 NOTE — Progress Notes (Signed)
PROGRESS NOTE        PATIENT DETAILS Name: [name] Age: 60 y.o. Sex: female Date of Birth: [birth year] Admit Date: 05/26/2021 Admitting Physician [name] Clos, MD WGN:[id number], [name] Roche, DO  Brief Narrative: Patient is a 60 y.o. female with history of DM-2, CAD, HTN, MCTD, chronic right foot ulcer (refused BKA in the past as wanted to try hyperbaric therapy)-presenting with fever/RLE erythema-found to have sepsis and Enterococcus bacteremia.  Subjective:  RLE erythema pressures.  No other issues.  Objective: Vitals: Blood pressure (!) 138/98, pulse 90, temperature 98.1 F (36.7 C), temperature source Oral, resp. rate 20, height  (1.575 m), weight (!) 149.7 kg, SpO2 96 %.   Exam: Gen Exam:Alert awake-not in any distress HEENT:atraumatic, normocephalic Chest: B/L clear to auscultation anteriorly CVS:S1S2 regular Abdomen:soft non tender, non distended Extremities: Mild RLE erythema. Neurology: Non focal Skin: no rash   Pertinent Labs/Radiology: WBC: 6.4 Hb: 10.3 Creatinine: 1.15  Microbiology: 9/15>>Blood culture: 1/2: Enterococcus faecalis 9/17>> blood culture: No growth  Radiology/Echo: 9/15>> CT right foot: Chronic osteomyelitis at third metatarsal fracture site-soft tissue swelling consistent with cellulitis. 9/18>> TTE: EF 55-60%, no obvious vegetation.   Assessment/Plan: Sepsis due to RLE cellulitis/diabetic foot with Enterococcus bacteremia: Sepsis physiology has resolved-antibiotics narrowed to Unasyn.  Repeat blood cultures negative.  TTE without obvious vegetations-ID recommending TEE.  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  Dr. Lajoyce [name] planning on BKA this coming Wednesday.  HTN: BP stable-continue Imdur, Avapro and Cardizem.  Follow and adjust.    Chronic pain syndrome: At baseline-continue as  needed oxycodone and Neurontin  CAD-s/p RCA PCI on 2019: No anginal symptoms-continue ASA  DM-2 (A1c 7.3 on 9/16): CBGs remain on the higher side-on Humulin R 500 sliding scale as outpatient-increase Lantus to 32 units, increase Premeal NovoLog to 12 units-continue SSI-reassess 9/20.    Recent Labs    05/29/21 1634 05/29/21 1954 05/30/21 0827  GLUCAP 192* 242* 246*     HLD: Continue Zetia  MCTD: Continue Plaquenil  Hypothyroidism: Continue Synthroid  GERD: Continue PPI  Restless leg syndrome: Continue Mirapex  Bronchial asthma: Not in exacerbation-continue bronchodilators  Morbid Obesity: Estimated body mass index is 60.36 kg/m as calculated from the following:   Height as of this encounter:  (1.575 m).   Weight as of this encounter: 149.7 kg.    Procedures :None Consults: DVT Prophylaxis: Lovenox Code Status:Full code  Family Communication:None at bedside  Time spent: 25 minutes-Greater than 50% of this time was spent in counseling, explanation of diagnosis, planning of further management, and coordination of care.  Diet: Diet Order             Diet heart healthy/carb modified Room service appropriate? Yes; Fluid consistency: Thin  Diet effective now                      Disposition Plan: Status is: Inpatient  Remains inpatient appropriate because:Inpatient level of care appropriate due to severity of illness  Dispo: The patient is from: Home              Anticipated d/c is to: Home              Patient currently is not medically stable to d/c.  Difficult to place patient No    Barriers to Discharge: Enterococcal bacteremia-RLE-chronic osteomyelitis of right foot-needs BKA.  Antimicrobial agents: Anti-infectives (From admission, onward)    Start     Dose/Rate Route Frequency Ordered Stop   05/28/21 1630  Ampicillin-Sulbactam (UNASYN) 3 g in sodium chloride 0.9 % 100 mL IVPB        3 g 200 mL/hr over 30 Minutes Intravenous Every 6  hours 05/28/21 0946     05/27/21 2200  vancomycin (VANCOREADY) IVPB 1750 mg/350 mL  Status:  Discontinued        1,750 mg 175 mL/hr over 120 Minutes Intravenous Every 24 hours 05/27/21 0759 05/28/21 0849   05/27/21 2200  hydroxychloroquine (PLAQUENIL) tablet 200 mg        200 mg Oral 2 times daily 05/27/21 2004     05/27/21 1800  vancomycin (VANCOREADY) IVPB 1500 mg/300 mL  Status:  Discontinued        1,500 mg 150 mL/hr over 120 Minutes Intravenous Every 24 hours 05/26/21 1812 05/27/21 0757   05/27/21 0800  ceFEPIme (MAXIPIME) 2 g in sodium chloride 0.9 % 100 mL IVPB  Status:  Discontinued        2 g 200 mL/hr over 30 Minutes Intravenous Every 8 hours 05/27/21 0749 05/28/21 0849   05/27/21 0800  vancomycin (VANCOCIN) IVPB 1000 mg/200 mL premix        1,000 mg 200 mL/hr over 60 Minutes Intravenous  Once 05/27/21 0757 05/27/21 0948   05/26/21 2200  ceFEPIme (MAXIPIME) 2 g in sodium chloride 0.9 % 100 mL IVPB  Status:  Discontinued        2 g 200 mL/hr over 30 Minutes Intravenous Every 8 hours 05/26/21 1812 05/27/21 0749   05/26/21 1815  vancomycin (VANCOCIN) IVPB 1000 mg/200 mL premix        1,000 mg 200 mL/hr over 60 Minutes Intravenous Every hour 05/26/21 1812 05/26/21 2014   05/26/21 1745  vancomycin (VANCOCIN) 2,500 mg in sodium chloride 0.9 % 500 mL IVPB  Status:  Discontinued        2,500 mg 250 mL/hr over 120 Minutes Intravenous  Once 05/26/21 1741 05/26/21 1812        MEDICATIONS: Scheduled Meds:  acetaminophen  650 mg Oral Q6H   aspirin EC  81 mg Oral Daily   diltiazem  240 mg Oral Daily   enoxaparin (LOVENOX) injection  0.5 mg/kg Subcutaneous Daily   ezetimibe  10 mg Oral Daily   feeding supplement (GLUCERNA SHAKE)  237 mL Oral TID BM   gabapentin  400 mg Oral BID WC   gabapentin  800 mg Oral QHS   hydroxychloroquine  200 mg Oral BID   insulin aspart  0-20 Units Subcutaneous TID WC   insulin aspart  0-5 Units Subcutaneous QHS   insulin aspart  12 Units Subcutaneous  TID WC   insulin glargine-yfgn  32 Units Subcutaneous Daily   irbesartan  150 mg Oral Daily   isosorbide mononitrate  30 mg Oral Daily   levothyroxine  200 mcg Oral QAC breakfast   loratadine  10 mg Oral QHS   mupirocin ointment   Topical TID   pramipexole  1 mg Oral BID   saccharomyces boulardii  250 mg Oral BID   Vitamin D (Ergocalciferol)  50,000 Units Oral Q7 days   Continuous Infusions:  ampicillin-sulbactam (UNASYN) IV 3 g (05/30/21 0915)   PRN Meds:.acetaminophen **OR** acetaminophen, albuterol, cyclobenzaprine, fluticasone, magnesium oxide, nitroGLYCERIN, oxyCODONE, pantoprazole, pramipexole, promethazine  I have personally reviewed following labs and imaging studies  LABORATORY DATA: CBC: Recent Labs  Lab 05/26/21 1700 05/27/21 0645 05/28/21 0057 05/29/21 0052  WBC 20.9* 11.1* 7.1 6.4  NEUTROABS 19.0*  --   --   --   HGB 12.6 11.4* 10.4* 10.3*  HCT 40.7 36.6 35.2* 34.8*  MCV 82.4 82.2 85.0 85.3  PLT 278 230 231 229     Basic Metabolic Panel: Recent Labs  Lab 05/26/21 1700 05/27/21 0645 05/28/21 0057 05/29/21 0052  NA 136 136 135 136  K 4.9 3.7 3.7 3.7  CL 98 101 97* 98  CO2 24 26 27 27   GLUCOSE 207* 232* 236* 279*  BUN 9 9 8 10   CREATININE 1.05* 0.90 1.00 1.15*  CALCIUM 9.4 8.7* 8.8* 8.6*     GFR: Estimated Creatinine Clearance: 74.8 mL/min (A) (by C-G formula based on SCr of 1.15 mg/dL (H)).  Liver Function Tests: Recent Labs  Lab 05/26/21 1700 05/28/21 0057  AST 42* 76*  ALT 33 36  ALKPHOS 48 37*  BILITOT 0.7 1.1  PROT 7.8 6.1*  ALBUMIN 3.9 2.9*    No results for input(s): LIPASE, AMYLASE in the last 168 hours. No results for input(s): AMMONIA in the last 168 hours.  Coagulation Profile: Recent Labs  Lab 05/26/21 1745  INR 1.1     Cardiac Enzymes: No results for input(s): CKTOTAL, CKMB, CKMBINDEX, TROPONINI in the last 168 hours.  BNP (last 3 results) No results for input(s): PROBNP in the last 8760 hours.  Lipid  Profile: No results for input(s): CHOL, HDL, LDLCALC, TRIG, CHOLHDL, LDLDIRECT in the last 72 hours.  Thyroid Function Tests: No results for input(s): TSH, T4TOTAL, FREET4, T3FREE, THYROIDAB in the last 72 hours.  Anemia Panel: No results for input(s): VITAMINB12, FOLATE, FERRITIN, TIBC, IRON, RETICCTPCT in the last 72 hours.  Urine analysis:    Component Value Date/Time   COLORURINE YELLOW 02/03/2020 1630   APPEARANCEUR CLEAR 02/03/2020 1630   LABSPEC 1.008 02/03/2020 1630   PHURINE 5.0 02/03/2020 1630   GLUCOSEU NEGATIVE 02/03/2020 1630   HGBUR NEGATIVE 02/03/2020 1630   BILIRUBINUR NEGATIVE 02/03/2020 1630   KETONESUR NEGATIVE 02/03/2020 1630   PROTEINUR NEGATIVE 02/03/2020 1630   UROBILINOGEN 0.2 02/10/2015 1835   NITRITE NEGATIVE 02/03/2020 1630   LEUKOCYTESUR NEGATIVE 02/03/2020 1630    Sepsis Labs: Lactic Acid, Venous    Component Value Date/Time   LATICACIDVEN 2.1 (HH) 05/27/2021 0643    MICROBIOLOGY: Recent Results (from the past 240 hour(s))  Blood Culture (routine x 2)     Status: Abnormal   Collection Time: 05/26/21  5:00 PM   Specimen: Left Antecubital; Blood  Result Value Ref Range Status   Specimen Description   Final    LEFT ANTECUBITAL Performed at [facility], [address] [place], [facility] [place]    Special Requests   Final    BOTTLES DRAWN AEROBIC AND ANAEROBIC Blood Culture adequate volume Performed at [facility], [address] [place], [address] [place]    Culture  Setup Time   Final    GRAM POSITIVE COCCI IN CHAINS AEROBIC BOTTLE ONLY CRITICAL RESULT CALLED TO, READ BACK BY AND VERIFIED WITH[facility] Celedonio Miyamoto 05/27/21 A BROWNING Performed at [facility], [address] [place], [address] [place]    Culture ENTEROCOCCUS FAECALIS (A)  Final   Report Status 05/29/2021 FINAL  Final   Organism ID, Bacteria ENTEROCOCCUS FAECALIS  Final  Susceptibility   Enterococcus faecalis - MIC*     AMPICILLIN <=2 SENSITIVE Sensitive     VANCOMYCIN 1 SENSITIVE Sensitive     GENTAMICIN SYNERGY SENSITIVE Sensitive     * ENTEROCOCCUS FAECALIS  Blood Culture (routine x 2)     Status: None (Preliminary result)   Collection Time: 05/26/21  5:00 PM   Specimen: Left Antecubital; Blood  Result Value Ref Range Status   Specimen Description   Final    LEFT ANTECUBITAL Performed at [facility], [address] [place], [place] [address]    Special Requests   Final    BOTTLES DRAWN AEROBIC AND ANAEROBIC Blood Culture adequate volume Performed at [facility], [address] [place], [place] [address]    Culture   Final    NO GROWTH 4 DAYS Performed at [facility], [address] [place], [place] [address]    Report Status PENDING  Incomplete  Resp Panel by RT-PCR (Flu A&B, Covid) Nasopharyngeal Swab     Status: None   Collection Time: 05/26/21  5:00 PM   Specimen: Nasopharyngeal Swab; Nasopharyngeal(NP) swabs in vial transport medium  Result Value Ref Range Status   SARS Coronavirus 2 by RT PCR NEGATIVE NEGATIVE Final    Comment: (NOTE) SARS-CoV-2 target nucleic acids are NOT DETECTED.  The SARS-CoV-2 RNA is generally detectable in upper respiratory specimens during the acute phase of infection. The lowest concentration of SARS-CoV-2 viral copies this assay can detect is 138 copies/mL. A negative result does not preclude SARS-Cov-2 infection and should not be used as the sole basis for treatment or other patient management decisions. A negative result may occur with  improper specimen collection/handling, submission of specimen other than nasopharyngeal swab, presence of viral mutation(s) within the areas targeted by this assay, and inadequate number of viral copies(<138 copies/mL). A negative result must be combined with clinical observations, patient history, and epidemiological information. The expected result is Negative.  Fact Sheet for Patients:   BloggerCourse.com  Fact Sheet for Healthcare Providers:  SeriousBroker.it  This test is no t yet approved or cleared by the [place] FDA and  has been authorized for detection and/or diagnosis of SARS-CoV-2 by FDA under an Emergency Use Authorization (EUA). This EUA will remain  in effect (meaning this test can be used) for the duration of the COVID-19 declaration under Section 564(b)(1) of the Act, 21 U.S.C.section 360bbb-3(b)(1), unless the authorization is terminated  or revoked sooner.       Influenza A by PCR NEGATIVE NEGATIVE Final   Influenza B by PCR NEGATIVE NEGATIVE Final    Comment: (NOTE) The Xpert Xpress SARS-CoV-2/FLU/RSV plus assay is intended as an aid in the diagnosis of influenza from Nasopharyngeal swab specimens and should not be used as a sole basis for treatment. Nasal washings and aspirates are unacceptable for Xpert Xpress SARS-CoV-2/FLU/RSV testing.  Fact Sheet for Patients: BloggerCourse.com  Fact Sheet for Healthcare Providers: SeriousBroker.it  This test is not yet approved or cleared by the [place] FDA and has been authorized for detection and/or diagnosis of SARS-CoV-2 by FDA under an Emergency Use Authorization (EUA). This EUA will remain in effect (meaning this test can be used) for the duration of the COVID-19 declaration under Section 564(b)(1) of the Act, 21 U.S.C. section 360bbb-3(b)(1), unless the authorization is terminated or revoked.  Performed at [facility], [address] [place], [place] [address]   Blood Culture ID Panel (Reflexed)  Status: Abnormal   Collection Time: 05/26/21  5:00 PM  Result Value Ref Range Status   Enterococcus faecalis DETECTED (A) NOT DETECTED Final    Comment: CRITICAL RESULT CALLED TO, READ BACK BY AND VERIFIED WITH: E [place] PHARMD 1624 05/27/21 A BROWNING    Enterococcus  Faecium NOT DETECTED NOT DETECTED Final   Listeria monocytogenes NOT DETECTED NOT DETECTED Final   Staphylococcus species NOT DETECTED NOT DETECTED Final   Staphylococcus aureus (BCID) NOT DETECTED NOT DETECTED Final   Staphylococcus epidermidis NOT DETECTED NOT DETECTED Final   Staphylococcus lugdunensis NOT DETECTED NOT DETECTED Final   Streptococcus species NOT DETECTED NOT DETECTED Final   Streptococcus agalactiae NOT DETECTED NOT DETECTED Final   Streptococcus pneumoniae NOT DETECTED NOT DETECTED Final   Streptococcus pyogenes NOT DETECTED NOT DETECTED Final   A.calcoaceticus-baumannii NOT DETECTED NOT DETECTED Final   Bacteroides fragilis NOT DETECTED NOT DETECTED Final   Enterobacterales NOT DETECTED NOT DETECTED Final   Enterobacter cloacae complex NOT DETECTED NOT DETECTED Final   Escherichia coli NOT DETECTED NOT DETECTED Final   Klebsiella aerogenes NOT DETECTED NOT DETECTED Final   Klebsiella oxytoca NOT DETECTED NOT DETECTED Final   Klebsiella pneumoniae NOT DETECTED NOT DETECTED Final   Proteus species NOT DETECTED NOT DETECTED Final   Salmonella species NOT DETECTED NOT DETECTED Final   Serratia marcescens NOT DETECTED NOT DETECTED Final   Haemophilus influenzae NOT DETECTED NOT DETECTED Final   Neisseria meningitidis NOT DETECTED NOT DETECTED Final   Pseudomonas aeruginosa NOT DETECTED NOT DETECTED Final   Stenotrophomonas maltophilia NOT DETECTED NOT DETECTED Final   Candida albicans NOT DETECTED NOT DETECTED Final   Candida auris NOT DETECTED NOT DETECTED Final   Candida glabrata NOT DETECTED NOT DETECTED Final   Candida krusei NOT DETECTED NOT DETECTED Final   Candida parapsilosis NOT DETECTED NOT DETECTED Final   Candida tropicalis NOT DETECTED NOT DETECTED Final   Cryptococcus neoformans/gattii NOT DETECTED NOT DETECTED Final   Vancomycin resistance NOT DETECTED NOT DETECTED Final    Comment: Performed at [facility], [address] [place], [place]  [address]  Culture, blood (routine x 2)     Status: None (Preliminary result)   Collection Time: 05/28/21 12:57 AM   Specimen: BLOOD RIGHT HAND  Result Value Ref Range Status   Specimen Description BLOOD RIGHT HAND  Final   Special Requests   Final    BOTTLES DRAWN AEROBIC AND ANAEROBIC Blood Culture adequate volume   Culture   Final    NO GROWTH 2 DAYS Performed at [facility], [address] [place], [place] [address]    Report Status PENDING  Incomplete  Culture, blood (routine x 2)     Status: None (Preliminary result)   Collection Time: 05/28/21 12:57 AM   Specimen: BLOOD  Result Value Ref Range Status   Specimen Description BLOOD RIGHT ANTECUBITAL  Final   Special Requests   Final    BOTTLES DRAWN AEROBIC AND ANAEROBIC Blood Culture adequate volume   Culture   Final    NO GROWTH 2 DAYS Performed at [facility], [address] [place], [place] [address]    Report Status PENDING  Incomplete    RADIOLOGY STUDIES/RESULTS: ECHOCARDIOGRAM COMPLETE  Result Date: 05/29/2021    ECHOCARDIOGRAM REPORT   Patient Name:   [name] Date of Exam: 05/29/2021 Medical Rec #:  [id number]      Height:       62.0 in Accession #:  [id number]     Weight:       330.0 lb Date of Birth:  [birth year]     BSA:          2.365 m Patient Age:    59 years       BP:           120/53 mmHg Patient Gender: F              HR:           89 bpm. Exam Location:  Inpatient Procedure: 2D Echo, Cardiac Doppler, Color Doppler and Intracardiac            Opacification Agent Indications:    Bacteremia  History:        Patient has prior history of Echocardiogram examinations, most                 recent 03/04/2021. Aortic Valve Disease, Arrythmias:Atrial                 Fibrillation; Risk Factors:Hypertension, Diabetes and                 Dyslipidemia.  Sonographer:    Ross Ludwig RDCS (AE) Referring Phys: [id number] [facility] T VU  Sonographer Comments: Technically challenging study due to limited acoustic windows,  Technically difficult study due to poor echo windows, suboptimal parasternal window, suboptimal apical window, suboptimal subcostal window and patient is morbidly obese.  Image acquisition challenging due to patient body habitus. IMPRESSIONS  1. An aortic valve gradient was measured. The valve cannot be seen well enough to make any further comments.  2. Technically difficult echo with poor image quality. Using Definity contrast, the LV systolic function can be seen to be well preserved.  3. Left ventricular ejection fraction, by estimation, is 55 to 60%. The left ventricle has normal function. Left ventricular endocardial border not optimally defined to evaluate regional wall motion. Left ventricular diastolic function could not be evaluated.  4. Right ventricular systolic function was not well visualized. The right ventricular size is not well visualized.  5. The mitral valve was not well visualized. No evidence of mitral valve regurgitation.  6. The aortic valve was not well visualized. Aortic valve regurgitation is not visualized. Mild to moderate aortic valve stenosis. FINDINGS  Left Ventricle: Left ventricular ejection fraction, by estimation, is 55 to 60%. The left ventricle has normal function. Left ventricular endocardial border not optimally defined to evaluate regional wall motion. Definity contrast agent was given IV to delineate the left ventricular endocardial borders. The left ventricular internal cavity size was normal in size. There is no left ventricular hypertrophy. Left ventricular diastolic function could not be evaluated. Right Ventricle: The right ventricular size is not well visualized. Right vetricular wall thickness was not well visualized. Right ventricular systolic function was not well visualized. Left Atrium: Left atrial size was not well visualized. Right Atrium: Right atrial size was not well visualized. Pericardium: The pericardium was not well visualized. Mitral Valve: The mitral  valve was not well visualized. No evidence of mitral valve regurgitation. Tricuspid Valve: The tricuspid valve is not well visualized. Tricuspid valve regurgitation is not demonstrated. Aortic Valve: The aortic valve was not well visualized. Aortic valve regurgitation is not visualized. Mild to moderate aortic stenosis is present. Aortic valve mean gradient measures 16.0 mmHg. Aortic valve peak gradient measures 32.3 mmHg. Aortic valve area, by VTI measures 1.83 cm. Pulmonic Valve: The pulmonic valve was not well visualized. Pulmonic valve regurgitation is  not visualized. Aorta: The aortic root was not well visualized. IAS/Shunts: The interatrial septum was not well visualized. Additional Comments: Technically difficult echo with poor image quality. Using Definity contrast, the LV systolic function can be seen to be well preserved. An aortic valve gradient was measured. The valve cannot be seen well enough to make any further comments.  LEFT VENTRICLE PLAX 2D LVIDd:         5.40 cm  Diastology LVIDs:         3.50 cm  LV e' medial:    14.70 cm/s LV PW:         1.70 cm  LV E/e' medial:  8.0 LV IVS:        1.30 cm  LV e' lateral:   10.50 cm/s LVOT diam:     2.20 cm  LV E/e' lateral: 11.1 LV SV:         94 LV SV Index:   40 LVOT Area:     3.80 cm  IVC IVC diam: 1.90 cm LEFT ATRIUM           Index LA diam:      2.80 cm 1.18 cm/m LA Vol (A4C): 47.1 ml 19.91 ml/m  AORTIC VALVE AV Area (Vmax):    2.01 cm AV Area (Vmean):   2.09 cm AV Area (VTI):     1.83 cm AV Vmax:           284.00 cm/s AV Vmean:          189.000 cm/s AV VTI:            0.513 m AV Peak Grad:      32.3 mmHg AV Mean Grad:      16.0 mmHg LVOT Vmax:         150.00 cm/s LVOT Vmean:        104.000 cm/s LVOT VTI:          0.247 m LVOT/AV VTI ratio: 0.48  AORTA Ao Root diam: 3.10 cm MITRAL VALVE MV Area (PHT): 4.63 cm     SHUNTS MV Decel Time: 164 msec     Systemic VTI:  0.25 m MV E velocity: 117.00 cm/s  Systemic Diam: 2.20 cm MV A velocity: 108.00 cm/s  MV E/A ratio:  1.08 Kristeen Miss MD Electronically signed by Kristeen Miss MD Signature Date/Time: 05/29/2021/11:46:02 AM    Final      LOS: 3 days   Jeoffrey Massed, MD  [facility]    To contact the attending provider between 7A-7P or the covering provider during after hours 7P-7A, please log into the web site [email or web address] and access using universal [place] password for that web site. If you do not have the password, please call the hospital operator.  05/30/2021, 11:30 AM

## 2021-05-31 ENCOUNTER — Encounter (HOSPITAL_BASED_OUTPATIENT_CLINIC_OR_DEPARTMENT_OTHER): Payer: 59 | Admitting: Internal Medicine

## 2021-05-31 DIAGNOSIS — E1169 Type 2 diabetes mellitus with other specified complication: Secondary | ICD-10-CM | POA: Diagnosis not present

## 2021-05-31 DIAGNOSIS — L03115 Cellulitis of right lower limb: Secondary | ICD-10-CM | POA: Diagnosis not present

## 2021-05-31 DIAGNOSIS — I2510 Atherosclerotic heart disease of native coronary artery without angina pectoris: Secondary | ICD-10-CM | POA: Diagnosis not present

## 2021-05-31 DIAGNOSIS — R7881 Bacteremia: Secondary | ICD-10-CM | POA: Diagnosis not present

## 2021-05-31 LAB — GLUCOSE, CAPILLARY
Glucose-Capillary: 258 mg/dL — ABNORMAL HIGH (ref 70–99)
Glucose-Capillary: 304 mg/dL — ABNORMAL HIGH (ref 70–99)
Glucose-Capillary: 230 mg/dL — ABNORMAL HIGH (ref 70–99)
Glucose-Capillary: 197 mg/dL — ABNORMAL HIGH (ref 70–99)

## 2021-05-31 LAB — CULTURE, BLOOD (ROUTINE X 2)
Culture: NO GROWTH
Special Requests: ADEQUATE

## 2021-05-31 MED ORDER — INSULIN ASPART 100 UNIT/ML IJ SOLN
15.0000 [IU] | Freq: Three times a day (TID) | INTRAMUSCULAR | Status: DC
  Administered 2021-05-31 – 2021-06-02 (×5): 15 [IU] via SUBCUTANEOUS

## 2021-05-31 MED ORDER — INSULIN GLARGINE-YFGN 100 UNIT/ML ~~LOC~~ SOLN
42.0000 [IU] | Freq: Every day | SUBCUTANEOUS | Status: DC
  Administered 2021-05-31 – 2021-06-02 (×3): 42 [IU] via SUBCUTANEOUS
  Filled 2021-05-31 (×3): qty 0.42

## 2021-05-31 NOTE — Progress Notes (Signed)
Pharmacy Antibiotic Note   [name] is a 60 y.o. female admitted on 05/26/2021 with enterococcus faecalis bacteremia 2/2 cellulitis and chronic R diabetic foot ulcer. CT shows cellulitis and chronic osteo. Pharmacy was initially consulted to dose vancomycin and cefepime, narrowed to Unasyn (now on day 3) with pharmacy consult to dose.   Afebrile, WBC 7.1 > 6.4, Scr stable 1.15   Plan: Start Unasyn 3g q6h F/u renal fxn, clinical status and duration    Temp (24hrs), Avg: 98.2 F (36.8 C), Min: 97.8 F (36.6 C), Max: 98.7 F (37.1 C)   Last Labs         Recent Labs  Lab 05/26/21 1700 05/27/21 0643 05/27/21 0645 05/28/21 0057  WBC 20.9*  --  11.1* 7.1  CREATININE 1.05*  --  0.90 1.00  LATICACIDVEN 3.3* 2.1*  --   --        Estimated Creatinine Clearance: 75 mL/min (by C-G formula based on SCr of 1.15 mg/dL).     Antimicrobials this admission: Vancomycin 9/15 >> 9/17 Cefepime 9/15 >> 9/17 Unasyn 9/17    Dose adjustments this admission: none   Microbiology results: 9/15 BCx: gram + cocci in chains 9/15 BCID: enterococcus faecalis    Thank you for allowing pharmacy to participate in this patient's care.

## 2021-05-31 NOTE — Progress Notes (Signed)
Inpatient Rehab Admissions Coordinator :  Per therapy recommendations, patient was screened for CIR candidacy by Ottie Glazier RN MSN.  At this time patient appears to be a potential candidate for CIR. I will place a rehab consult per protocol for full assessment. Please call me with any questions. Noted BKA pending.  Ottie Glazier RN MSN Admissions Coordinator [phone]

## 2021-05-31 NOTE — Progress Notes (Signed)
Physical Therapy Treatment Patient Details Name: [name] MRN: [id number] DOB: 07-20-1961 Today's Date: 05/31/2021   History of Present Illness Pt is a 60 y.o. female admitted 05/26/21 with fever and RLE erythema; found to have sepsis due to RLE cellulitis/diabetic foot with Enterococcus bacteremia. 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 PMH includes asthma, CHF, CAD, HLD, HTN, DM2, obesity, connective tissue disease, chronic pain, R toe amputations (01/2020).    PT Comments    Patient seen for discussion re: discharge planning (pt with questions re: home instead of SNF). Patient does have ramp, wheelchair, ability to sleep in recliner, and ability to use [facility] if she goes home with husband's assist. 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 Pt attempted sit to stand with LLE only and was unable to come to stand stating her back spasms were too much.   Will attempt to see again after pt has had muscle relaxer. Again explained that final determination of discharge plan will not be made until post-op and see how pt is actually moving after BKA.    Recommendations for follow up therapy are one component of a multi-disciplinary discharge planning process, led by the attending physician.  Recommendations may be updated based on patient status, additional functional criteria and insurance authorization.  Follow Up Recommendations  SNF;Supervision for mobility/OOB (expected post-op (although pt hopeful can go home at wheelchair level))     Equipment Recommendations  Other (comment)    Recommendations for Other Services OT consult     Precautions / Restrictions Precautions Precautions: None     Mobility  Bed Mobility               General bed mobility comments: sitting EOB    Transfers Overall transfer level: Needs  assistance Equipment used: Rolling walker (2 wheeled) Transfers: Sit to/from Stand           General transfer comment: Trialled standing with "RLE NWB" to mimic potential R BKA, pt unable to stand without support of RLE and developed back spasms limiting her ability to attempt more than twice.  Ambulation/Gait             General Gait Details: Discussed practicing "hopping" with NWB RLE to simulate mobility s/p BKA. Pt with back spasms and could not attempt at this time. Not yet time for meds   Stairs             Wheelchair Mobility    Modified Rankin (Stroke Patients Only)       Balance Overall balance assessment: Needs assistance   Sitting balance-[name]: Fair       Standing balance-[name]: Good                              Cognition Arousal/Alertness: Awake/alert Behavior During Therapy: WFL for tasks assessed/performed Overall Cognitive Status: Within Functional Limits for tasks assessed                                        Exercises Other Exercises Other Exercises: Discussed ok to practice sit to stand NWB RLE when husband or nursing present    General Comments General comments (skin integrity, edema, etc.): Husband present and discussed set up  at home. Pt will have to take 2 "hops" to get from wheelchair to her bed, however can stay in main room and sleep on recliner and have wheelchair directly beside her.      Pertinent Vitals/Pain Pain Assessment: 0-10 Pain Score: 8  Pain Location: back Pain Descriptors / Indicators: Spasm Pain Intervention(s): Limited activity within patient's tolerance;Patient requesting pain meds-RN notified    Home Living                      Prior Function            PT Goals (current goals can now be found in the care plan section) Acute Rehab PT Goals Patient Stated Goal: "I'd plan to do the surgery but have to get my husband on board" PT Goal Formulation: With  patient Time For Goal Achievement: 06/11/21 Potential to Achieve Goals: Good Progress towards PT goals: Not progressing toward goals - comment (due to back pain)    Frequency    Min 3X/week      PT Plan Current plan remains appropriate    Co-evaluation              AM-PAC PT "6 Clicks" Mobility   Outcome Measure  Help needed turning from your back to your side while in a flat bed without using bedrails?: None Help needed moving from lying on your back to sitting on the side of a flat bed without using bedrails?: None Help needed moving to and from a bed to a chair (including a wheelchair)?: None Help needed standing up from a chair using your arms (e.g., wheelchair or bedside chair)?: None Help needed to walk in hospital room?: None Help needed climbing 3-5 steps with a railing? : A Little 6 Click Score: 23    End of Session   Activity Tolerance: Patient limited by pain Patient left: in bed;with call bell/phone within reach;with family/visitor present Nurse Communication: Mobility status;Patient requests pain meds (muscle relaxer) PT Visit Diagnosis: Other abnormalities of gait and mobility (R26.89);Muscle weakness (generalized) (M62.81);Pain Pain - Right/Left:  (midline) Pain - part of body:  (back)     Time: 9935-7017 PT Time Calculation (min) (ACUTE ONLY): 14 min  Charges:  $Self Care/Home Management: 8-22                      Jerolyn Center, PT Pager [phone]    Zena Amos 05/31/2021, 11:09 AM

## 2021-05-31 NOTE — Progress Notes (Signed)
   [facility] has been requested to perform a transesophageal echocardiogram on [name] for bacteremia.  After careful review of history and examination, the risks and benefits of transesophageal echocardiogram have been explained including risks of esophageal damage, perforation (1:10,000 risk), bleeding, pharyngeal hematoma as well as other potential complications associated with conscious sedation including aspiration, arrhythmia, respiratory failure and death. Alternatives to treatment were discussed, questions were answered. Patient is willing to proceed.   Procedure will be done tomorrow 06/01/2021 in the OR during right below knee amputation.   Corrin Parker, PA-C 05/31/2021 5:25 PM

## 2021-05-31 NOTE — H&P (View-Only) (Signed)
PROGRESS NOTE        PATIENT DETAILS Name: [name] Age: 60 y.o. Sex: female Date of Birth: 1961-06-27 Admit Date: 05/26/2021 Admitting Physician Eduard Clos, MD ZOX:[id number], Jilda Roche, DO  Brief Narrative: Patient is a 60 y.o. female with history of DM-2, CAD, HTN, MCTD, chronic right foot ulcer (refused BKA in the past as wanted to try hyperbaric therapy)-presenting with fever/RLE erythema-found to have sepsis and Enterococcus bacteremia.  Subjective: No major issues overnight-lying comfortably in bed.  RLE appears unchanged from yesterday.  Objective: Vitals: Blood pressure 132/64, pulse 87, temperature 98.7 F (37.1 C), temperature source Oral, resp. rate 19, height  (1.575 m), weight (!) 149.7 kg, SpO2 95 %.   Exam: Gen Exam:Alert awake-not in any distress HEENT:atraumatic, normocephalic Chest: B/L clear to auscultation anteriorly CVS:S1S2 regular Abdomen:soft non tender, non distended Extremities: Mild RLE erythema persists. Neurology: Non focal Skin: no rash   Pertinent Labs/Radiology:   Microbiology: 9/15>>Blood culture: 1/2: Enterococcus faecalis 9/17>> blood culture: No growth  Radiology/Echo: 9/15>> CT right foot: Chronic osteomyelitis at third metatarsal fracture site-soft tissue swelling consistent with cellulitis. 9/18>> TTE: EF 55-60%, no obvious vegetation.   Assessment/Plan: Sepsis due to RLE cellulitis/diabetic foot with Enterococcus bacteremia: Sepsis physiology has resolved-antibiotics narrowed to Unasyn-TTE negative for vegetation-awaiting TEE.  ID following.   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  Dr. Lajoyce Corners planning on BKA this coming Wednesday.  HTN: BP stable-continue Imdur, Avapro and Cardizem.  Follow and adjust.    Chronic pain syndrome: At baseline-continue as needed  oxycodone and Neurontin  CAD-s/p RCA PCI on 2019: No anginal symptoms-continue ASA  DM-2 (A1c 7.3 on 9/16): CBGs remain on the higher side-on [place] R 500 sliding scale as outpatient-increase Lantus to 42 units, increase Premeal NovoLog to 15 units-continue SSI-reassess 9/21.    Recent Labs    05/30/21 2001 05/31/21 0813 05/31/21 1140  GLUCAP 266* 258* 304*     HLD: Continue Zetia  MCTD: Continue Plaquenil  Hypothyroidism: Continue Synthroid  GERD: Continue PPI  Restless leg syndrome: Continue Mirapex  Bronchial asthma: Not in exacerbation-continue bronchodilators  Morbid Obesity: Estimated body mass index is 60.36 kg/m as calculated from the following:   Height as of this encounter:  (1.575 m).   Weight as of this encounter: 149.7 kg.    Procedures :None Consults: DVT Prophylaxis: Lovenox Code Status:Full code  Family Communication:None at bedside  Time spent: 25 minutes-Greater than 50% of this time was spent in counseling, explanation of diagnosis, planning of further management, and coordination of care.  Diet: Diet Order             Diet heart healthy/carb modified Room service appropriate? Yes; Fluid consistency: Thin  Diet effective now                      Disposition Plan: Status is: Inpatient  Remains inpatient appropriate because:Inpatient level of care appropriate due to severity of illness  Dispo: The patient is from: Home              Anticipated d/c is to: Home              Patient currently is not medically stable to d/c.   Difficult to  place patient No    Barriers to Discharge: Enterococcal bacteremia-RLE-chronic osteomyelitis of right foot-needs BKA.  Antimicrobial agents: Anti-infectives (From admission, onward)    Start     Dose/Rate Route Frequency Ordered Stop   05/28/21 1630  Ampicillin-Sulbactam (UNASYN) 3 g in sodium chloride 0.9 % 100 mL IVPB        3 g 200 mL/hr over 30 Minutes Intravenous Every 6 hours  05/28/21 0946     05/27/21 2200  vancomycin (VANCOREADY) IVPB 1750 mg/350 mL  Status:  Discontinued        1,750 mg 175 mL/hr over 120 Minutes Intravenous Every 24 hours 05/27/21 0759 05/28/21 0849   05/27/21 2200  hydroxychloroquine (PLAQUENIL) tablet 200 mg        200 mg Oral 2 times daily 05/27/21 2004     05/27/21 1800  vancomycin (VANCOREADY) IVPB 1500 mg/300 mL  Status:  Discontinued        1,500 mg 150 mL/hr over 120 Minutes Intravenous Every 24 hours 05/26/21 1812 05/27/21 0757   05/27/21 0800  ceFEPIme (MAXIPIME) 2 g in sodium chloride 0.9 % 100 mL IVPB  Status:  Discontinued        2 g 200 mL/hr over 30 Minutes Intravenous Every 8 hours 05/27/21 0749 05/28/21 0849   05/27/21 0800  vancomycin (VANCOCIN) IVPB 1000 mg/200 mL premix        1,000 mg 200 mL/hr over 60 Minutes Intravenous  Once 05/27/21 0757 05/27/21 0948   05/26/21 2200  ceFEPIme (MAXIPIME) 2 g in sodium chloride 0.9 % 100 mL IVPB  Status:  Discontinued        2 g 200 mL/hr over 30 Minutes Intravenous Every 8 hours 05/26/21 1812 05/27/21 0749   05/26/21 1815  vancomycin (VANCOCIN) IVPB 1000 mg/200 mL premix        1,000 mg 200 mL/hr over 60 Minutes Intravenous Every hour 05/26/21 1812 05/26/21 2014   05/26/21 1745  vancomycin (VANCOCIN) 2,500 mg in sodium chloride 0.9 % 500 mL IVPB  Status:  Discontinued        2,500 mg 250 mL/hr over 120 Minutes Intravenous  Once 05/26/21 1741 05/26/21 1812        MEDICATIONS: Scheduled Meds:  acetaminophen  650 mg Oral Q6H   aspirin EC  81 mg Oral Daily   diltiazem  240 mg Oral Daily   enoxaparin (LOVENOX) injection  0.5 mg/kg Subcutaneous Daily   ezetimibe  10 mg Oral Daily   feeding supplement (GLUCERNA SHAKE)  237 mL Oral TID BM   gabapentin  400 mg Oral BID WC   gabapentin  800 mg Oral QHS   hydroxychloroquine  200 mg Oral BID   insulin aspart  0-20 Units Subcutaneous TID WC   insulin aspart  0-5 Units Subcutaneous QHS   insulin aspart  15 Units Subcutaneous TID WC    insulin glargine-yfgn  42 Units Subcutaneous Daily   irbesartan  150 mg Oral Daily   isosorbide mononitrate  30 mg Oral Daily   levothyroxine  200 mcg Oral QAC breakfast   loratadine  10 mg Oral QHS   multivitamin with minerals  1 tablet Oral Daily   mupirocin ointment   Topical TID   pramipexole  1 mg Oral BID   saccharomyces boulardii  250 mg Oral BID   Vitamin D (Ergocalciferol)  50,000 Units Oral Q7 days   Continuous Infusions:  ampicillin-sulbactam (UNASYN) IV 3 g (05/31/21 0847)   PRN Meds:.acetaminophen **OR** acetaminophen, albuterol, cyclobenzaprine, fluticasone,   magnesium oxide, nitroGLYCERIN, oxyCODONE, pantoprazole, pramipexole, promethazine   I have personally reviewed following labs and imaging studies  LABORATORY DATA: CBC: Recent Labs  Lab 05/26/21 1700 05/27/21 0645 05/28/21 0057 05/29/21 0052  WBC 20.9* 11.1* 7.1 6.4  NEUTROABS 19.0*  --   --   --   HGB 12.6 11.4* 10.4* 10.3*  HCT 40.7 36.6 35.2* 34.8*  MCV 82.4 82.2 85.0 85.3  PLT 278 230 231 229     Basic Metabolic Panel: Recent Labs  Lab 05/26/21 1700 05/27/21 0645 05/28/21 0057 05/29/21 0052  NA 136 136 135 136  K 4.9 3.7 3.7 3.7  CL 98 101 97* 98  CO2 24 26 27 27   GLUCOSE 207* 232* 236* 279*  BUN 9 9 8 10   CREATININE 1.05* 0.90 1.00 1.15*  CALCIUM 9.4 8.7* 8.8* 8.6*     GFR: Estimated Creatinine Clearance: 74.8 mL/min (A) (by C-G formula based on SCr of 1.15 mg/dL (H)).  Liver Function Tests: Recent Labs  Lab 05/26/21 1700 05/28/21 0057  AST 42* 76*  ALT 33 36  ALKPHOS 48 37*  BILITOT 0.7 1.1  PROT 7.8 6.1*  ALBUMIN 3.9 2.9*    No results for input(s): LIPASE, AMYLASE in the last 168 hours. No results for input(s): AMMONIA in the last 168 hours.  Coagulation Profile: Recent Labs  Lab 05/26/21 1745  INR 1.1     Cardiac Enzymes: No results for input(s): CKTOTAL, CKMB, CKMBINDEX, TROPONINI in the last 168 hours.  BNP (last 3 results) No results for input(s):  PROBNP in the last 8760 hours.  Lipid Profile: No results for input(s): CHOL, HDL, LDLCALC, TRIG, CHOLHDL, LDLDIRECT in the last 72 hours.  Thyroid Function Tests: No results for input(s): TSH, T4TOTAL, FREET4, T3FREE, THYROIDAB in the last 72 hours.  Anemia Panel: No results for input(s): VITAMINB12, FOLATE, FERRITIN, TIBC, IRON, RETICCTPCT in the last 72 hours.  Urine analysis:    Component Value Date/Time   COLORURINE YELLOW 02/03/2020 1630   APPEARANCEUR CLEAR 02/03/2020 1630   LABSPEC 1.008 02/03/2020 1630   PHURINE 5.0 02/03/2020 1630   GLUCOSEU NEGATIVE 02/03/2020 1630   HGBUR NEGATIVE 02/03/2020 1630   BILIRUBINUR NEGATIVE 02/03/2020 1630   KETONESUR NEGATIVE 02/03/2020 1630   PROTEINUR NEGATIVE 02/03/2020 1630   UROBILINOGEN 0.2 02/10/2015 1835   NITRITE NEGATIVE 02/03/2020 1630   LEUKOCYTESUR NEGATIVE 02/03/2020 1630    Sepsis Labs: Lactic Acid, Venous    Component Value Date/Time   LATICACIDVEN 2.1 (HH) 05/27/2021 0643    MICROBIOLOGY: Recent Results (from the past 240 hour(s))  Blood Culture (routine x 2)     Status: Abnormal   Collection Time: 05/26/21  5:00 PM   Specimen: Left Antecubital; Blood  Result Value Ref Range Status   Specimen Description   Final    LEFT ANTECUBITAL Performed at [facility], [address] [place], [facility] [place]    Special Requests   Final    BOTTLES DRAWN AEROBIC AND ANAEROBIC Blood Culture adequate volume Performed at [facility], [address] [place], [address] [place]    Culture  Setup Time   Final    GRAM POSITIVE COCCI IN CHAINS AEROBIC BOTTLE ONLY CRITICAL RESULT CALLED TO, READ BACK BY AND VERIFIED [facility] [facility] Celedonio Miyamoto 05/27/21 A BROWNING Performed at [facility], [address], [place], [address] [place]    Culture ENTEROCOCCUS FAECALIS (A)  Final   Report Status 05/29/2021 FINAL  Final  Organism ID, Bacteria ENTEROCOCCUS FAECALIS  Final      Susceptibility    Enterococcus faecalis - MIC*    AMPICILLIN <=2 SENSITIVE Sensitive     VANCOMYCIN 1 SENSITIVE Sensitive     GENTAMICIN SYNERGY SENSITIVE Sensitive     * ENTEROCOCCUS FAECALIS  Blood Culture (routine x 2)     Status: None   Collection Time: 05/26/21  5:00 PM   Specimen: Left Antecubital; Blood  Result Value Ref Range Status   Specimen Description   Final    LEFT ANTECUBITAL Performed at [facility], [address] [place], [place] [address]    Special Requests   Final    BOTTLES DRAWN AEROBIC AND ANAEROBIC Blood Culture adequate volume Performed at [facility], [address] [place], [place] [address]    Culture   Final    NO GROWTH 5 DAYS Performed at [facility], [address], [place], [place] [address]    Report Status 05/31/2021 FINAL  Final  Resp Panel by RT-PCR (Flu A&B, Covid) Nasopharyngeal Swab     Status: None   Collection Time: 05/26/21  5:00 PM   Specimen: Nasopharyngeal Swab; Nasopharyngeal(NP) swabs in vial transport medium  Result Value Ref Range Status   SARS Coronavirus 2 by RT PCR NEGATIVE NEGATIVE Final    Comment: (NOTE) SARS-CoV-2 target nucleic acids are NOT DETECTED.  The SARS-CoV-2 RNA is generally detectable in upper respiratory specimens during the acute phase of infection. The lowest concentration of SARS-CoV-2 viral copies this assay can detect is 138 copies/mL. A negative result does not preclude SARS-Cov-2 infection and should not be used as the sole basis for treatment or other patient management decisions. A negative result may occur with  improper specimen collection/handling, submission of specimen other than nasopharyngeal swab, presence of viral mutation(s) within the areas targeted by this assay, and inadequate number of viral copies(<138 copies/mL). A negative result must be combined with clinical observations, patient history, and epidemiological information. The expected result is Negative.  Fact  Sheet for Patients:  BloggerCourse.com  Fact Sheet for Healthcare Providers:  SeriousBroker.it  This test is no t yet approved or cleared by the [place] FDA and  has been authorized for detection and/or diagnosis of SARS-CoV-2 by FDA under an Emergency Use Authorization (EUA). This EUA will remain  in effect (meaning this test can be used) for the duration of the COVID-19 declaration under Section 564(b)(1) of the Act, 21 U.S.C.section 360bbb-3(b)(1), unless the authorization is terminated  or revoked sooner.       Influenza A by PCR NEGATIVE NEGATIVE Final   Influenza B by PCR NEGATIVE NEGATIVE Final    Comment: (NOTE) The Xpert Xpress SARS-CoV-2/FLU/RSV plus assay is intended as an aid in the diagnosis of influenza from Nasopharyngeal swab specimens and should not be used as a sole basis for treatment. Nasal washings and aspirates are unacceptable for Xpert Xpress SARS-CoV-2/FLU/RSV testing.  Fact Sheet for Patients: BloggerCourse.com  Fact Sheet for Healthcare Providers: SeriousBroker.it  This test is not yet approved or cleared by the [place] FDA and has been authorized for detection and/or diagnosis of SARS-CoV-2 by FDA under an Emergency Use Authorization (EUA). This EUA will remain in effect (meaning this test can be used) for the duration of the COVID-19 declaration under Section 564(b)(1) of the Act, 21 U.S.C. section 360bbb-3(b)(1), unless the authorization is terminated or revoked.  Performed at [facility], [address]  [place], [place] [address]   Blood Culture ID Panel (Reflexed)     Status: Abnormal   Collection Time: 05/26/21  5:00 PM  Result Value Ref Range Status   Enterococcus faecalis DETECTED (A) NOT DETECTED Final    Comment: CRITICAL RESULT CALLED TO, READ BACK BY AND VERIFIED WITH: E BREWINGTON PHARMD 1624 05/27/21 A  BROWNING    Enterococcus Faecium NOT DETECTED NOT DETECTED Final   Listeria monocytogenes NOT DETECTED NOT DETECTED Final   Staphylococcus species NOT DETECTED NOT DETECTED Final   Staphylococcus aureus (BCID) NOT DETECTED NOT DETECTED Final   Staphylococcus epidermidis NOT DETECTED NOT DETECTED Final   Staphylococcus lugdunensis NOT DETECTED NOT DETECTED Final   Streptococcus species NOT DETECTED NOT DETECTED Final   Streptococcus agalactiae NOT DETECTED NOT DETECTED Final   Streptococcus pneumoniae NOT DETECTED NOT DETECTED Final   Streptococcus pyogenes NOT DETECTED NOT DETECTED Final   A.calcoaceticus-baumannii NOT DETECTED NOT DETECTED Final   Bacteroides fragilis NOT DETECTED NOT DETECTED Final   Enterobacterales NOT DETECTED NOT DETECTED Final   Enterobacter cloacae complex NOT DETECTED NOT DETECTED Final   Escherichia coli NOT DETECTED NOT DETECTED Final   Klebsiella aerogenes NOT DETECTED NOT DETECTED Final   Klebsiella oxytoca NOT DETECTED NOT DETECTED Final   Klebsiella pneumoniae NOT DETECTED NOT DETECTED Final   Proteus species NOT DETECTED NOT DETECTED Final   Salmonella species NOT DETECTED NOT DETECTED Final   Serratia marcescens NOT DETECTED NOT DETECTED Final   Haemophilus influenzae NOT DETECTED NOT DETECTED Final   Neisseria meningitidis NOT DETECTED NOT DETECTED Final   Pseudomonas aeruginosa NOT DETECTED NOT DETECTED Final   Stenotrophomonas maltophilia NOT DETECTED NOT DETECTED Final   Candida albicans NOT DETECTED NOT DETECTED Final   Candida auris NOT DETECTED NOT DETECTED Final   Candida glabrata NOT DETECTED NOT DETECTED Final   Candida krusei NOT DETECTED NOT DETECTED Final   Candida parapsilosis NOT DETECTED NOT DETECTED Final   Candida tropicalis NOT DETECTED NOT DETECTED Final   Cryptococcus neoformans/gattii NOT DETECTED NOT DETECTED Final   Vancomycin resistance NOT DETECTED NOT DETECTED Final    Comment: Performed at [facility], [address] [place] [address] [place], [place] [address]  Culture, blood (routine x 2)     Status: None (Preliminary result)   Collection Time: 05/28/21 12:57 AM   Specimen: BLOOD RIGHT HAND  Result Value Ref Range Status   Specimen Description BLOOD RIGHT HAND  Final   Special Requests   Final    BOTTLES DRAWN AEROBIC AND ANAEROBIC Blood Culture adequate volume   Culture   Final    NO GROWTH 3 DAYS Performed at [facility], [address] [place], [place] [address]    Report Status PENDING  Incomplete  Culture, blood (routine x 2)     Status: None (Preliminary result)   Collection Time: 05/28/21 12:57 AM   Specimen: BLOOD  Result Value Ref Range Status   Specimen Description BLOOD RIGHT ANTECUBITAL  Final   Special Requests   Final    BOTTLES DRAWN AEROBIC AND ANAEROBIC Blood Culture adequate volume   Culture   Final    NO GROWTH 3 DAYS Performed at [facility], [address] [place], [place] [address]    Report Status PENDING  Incomplete    RADIOLOGY STUDIES/RESULTS: No results found.   LOS: 4 days   Jeoffrey Massed, MD  [facility]    To contact the attending provider between 7A-7P or the covering  provider during after hours 7P-7A, please log into the web site [email or web address] and access using universal [place] password for that web site. If you do not have the password, please call the hospital operator.  05/31/2021, 12:17 PM

## 2021-05-31 NOTE — Progress Notes (Signed)
Physical Therapy Treatment  Clinical impression: Patient very motivated and able to "hop" on LLE with use of RW x 3 feet while holding RLE in NWB (in preparation for becoming Rt BKA). Discussed home support and home layout and feel pt would be an appropriate candidate for CIR post-surgery. Will continue to follow and re-evaluate post-op as indicated/appropriate.     05/31/21 1500  PT Visit Information  Last PT Received On 05/31/21  Assistance Needed +1 (likely +2 post-op)  History of Present Illness Pt is a 60 y.o. female admitted 05/26/21 with fever and RLE erythema; found to have sepsis due to RLE cellulitis/diabetic foot with Enterococcus bacteremia. 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 PMH includes asthma, CHF, CAD, HLD, HTN, DM2, obesity, connective tissue disease, chronic pain, R toe amputations (01/2020).  Subjective Data  Patient Stated Goal "I'd plan to do the surgery but have to get my husband on board"  Precautions  Precautions None  Pain Assessment  Pain Assessment Faces  Faces Pain Scale 4  Pain Location back  Pain Descriptors / Indicators Sore  Pain Intervention(s) Limited activity within patient's tolerance  Cognition  Arousal/Alertness Awake/alert  Behavior During Therapy WFL for tasks assessed/performed  Overall Cognitive Status Within Functional Limits for tasks assessed  Bed Mobility  General bed mobility comments sitting EOB  Transfers  Overall transfer level Needs assistance  Equipment used Rolling walker (2 wheeled)  Transfers Sit to/from Stand  Sit to Stand Min guard  General transfer comment Trialled standing with "RLE NWB" to mimic potential R BKA, pt required PWB of RLE to come to stand  Ambulation/Gait  Ambulation/Gait assistance Min guard  Gait Distance (Feet) 3 Feet  Assistive device Rolling walker (2 wheeled)  General Gait Details pt able to  hop on LLE while holding RLE in NWB position x 3 feet with only minguard assist for safety  Balance  Overall balance assessment Needs assistance  Sitting balance-[name] Fair  Standing balance-[name] Good  Exercises  Exercises Other exercises  Other Exercises  Other Exercises Discussed ok to practice sit to stand NWB RLE when husband or nursing present  PT - End of Session  Activity Tolerance Patient tolerated treatment well  Patient left in bed;with call bell/phone within reach  Nurse Communication Mobility status   PT - Assessment/Plan  PT Plan Discharge plan needs to be updated  PT Visit Diagnosis Other abnormalities of gait and mobility (R26.89);Muscle weakness (generalized) (M62.81);Pain  PT Frequency (ACUTE ONLY) Min 3X/week  Recommendations for Other Services OT consult  Follow Up Recommendations CIR (post-op BKA)  PT equipment Other (comment) (potential drop-arm commode and sliding board)  AM-PAC PT "6 Clicks" Mobility Outcome Measure (Version 2)  Help needed turning from your back to your side while in a flat bed without using bedrails? 4  Help needed moving from lying on your back to sitting on the side of a flat bed without using bedrails? 4  Help needed moving to and from a bed to a chair (including a wheelchair)? 4  Help needed standing up from a chair using your arms (e.g., wheelchair or bedside chair)? 4  Help needed to walk in hospital room? 4  Help needed climbing 3-5 steps with a railing?  3  6 Click Score 23  Consider Recommendation of Discharge To: Home with no services  Progressive Mobility  What is the highest level of mobility based on the  progressive mobility assessment? Level 6 (Walks independently in room and hall) - Balance while walking in room without assist - Complete  Mobility Ambulated independently in room  PT Goal Progression  Progress towards PT goals Progressing toward goals  Acute Rehab PT Goals  PT Goal Formulation With patient   Time For Goal Achievement 06/11/21  Potential to Achieve Goals Good  PT Time Calculation  PT Start Time (ACUTE ONLY) 1436  PT Stop Time (ACUTE ONLY) 1449  PT Time Calculation (min) (ACUTE ONLY) 13 min  PT General Charges  $$ ACUTE PT VISIT 1 Visit  PT Treatments  $Gait Training 8-22 mins    Jerolyn Center, PT Pager [phone]

## 2021-05-31 NOTE — Progress Notes (Signed)
PROGRESS NOTE        PATIENT DETAILS Name: [name] Age: 60 y.o. Sex: female Date of Birth: 1961-06-27 Admit Date: 05/26/2021 Admitting Physician Eduard Clos, MD [id number], Jilda Roche, DO  Brief Narrative: Patient is a 60 y.o. female with history of DM-2, CAD, HTN, MCTD, chronic right foot ulcer (refused BKA in the past as wanted to try hyperbaric therapy)-presenting with fever/RLE erythema-found to have sepsis and Enterococcus bacteremia.  Subjective: No major issues overnight-lying comfortably in bed.  RLE appears unchanged from yesterday.  Objective: Vitals: Blood pressure 132/64, pulse 87, temperature 98.7 F (37.1 C), temperature source Oral, resp. rate 19, height  (1.575 m), weight (!) 149.7 kg, SpO2 95 %.   Exam: Gen Exam:Alert awake-not in any distress HEENT:atraumatic, normocephalic Chest: B/L clear to auscultation anteriorly CVS:S1S2 regular Abdomen:soft non tender, non distended Extremities: Mild RLE erythema persists. Neurology: Non focal Skin: no rash   Pertinent Labs/Radiology:   Microbiology: 9/15>>Blood culture: 1/2: Enterococcus faecalis 9/17>> blood culture: No growth  Radiology/Echo: 9/15>> CT right foot: Chronic osteomyelitis at third metatarsal fracture site-soft tissue swelling consistent with cellulitis. 9/18>> TTE: EF 55-60%, no obvious vegetation.   Assessment/Plan: Sepsis due to RLE cellulitis/diabetic foot with Enterococcus bacteremia: Sepsis physiology has resolved-antibiotics narrowed to Unasyn-TTE negative for vegetation-awaiting TEE.  ID following.   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  Dr. Lajoyce Corners planning on BKA this coming Wednesday.  HTN: BP stable-continue Imdur, Avapro and Cardizem.  Follow and adjust.    Chronic pain syndrome: At baseline-continue as needed  oxycodone and Neurontin  CAD-s/p RCA PCI on 2019: No anginal symptoms-continue ASA  DM-2 (A1c 7.3 on 9/16): CBGs remain on the higher side-on [place] R 500 sliding scale as outpatient-increase Lantus to 42 units, increase Premeal NovoLog to 15 units-continue SSI-reassess 9/21.    Recent Labs    05/30/21 2001 05/31/21 0813 05/31/21 1140  GLUCAP 266* 258* 304*     HLD: Continue Zetia  MCTD: Continue Plaquenil  Hypothyroidism: Continue Synthroid  GERD: Continue PPI  Restless leg syndrome: Continue Mirapex  Bronchial asthma: Not in exacerbation-continue bronchodilators  Morbid Obesity: Estimated body mass index is 60.36 kg/m as calculated from the following:   Height as of this encounter:  (1.575 m).   Weight as of this encounter: 149.7 kg.    Procedures :None Consults: DVT Prophylaxis: Lovenox Code Status:Full code  Family Communication:None at bedside  Time spent: 25 minutes-Greater than 50% of this time was spent in counseling, explanation of diagnosis, planning of further management, and coordination of care.  Diet: Diet Order             Diet heart healthy/carb modified Room service appropriate? Yes; Fluid consistency: Thin  Diet effective now                      Disposition Plan: Status is: Inpatient  Remains inpatient appropriate because:Inpatient level of care appropriate due to severity of illness  Dispo: The patient is from: Home              Anticipated d/c is to: Home              Patient currently is not medically stable to d/c.   Difficult to  place patient No    Barriers to Discharge: Enterococcal bacteremia-RLE-chronic osteomyelitis of right foot-needs BKA.  Antimicrobial agents: Anti-infectives (From admission, onward)    Start     Dose/Rate Route Frequency Ordered Stop   05/28/21 1630  Ampicillin-Sulbactam (UNASYN) 3 g in sodium chloride 0.9 % 100 mL IVPB        3 g 200 mL/hr over 30 Minutes Intravenous Every 6 hours  05/28/21 0946     05/27/21 2200  vancomycin (VANCOREADY) IVPB 1750 mg/350 mL  Status:  Discontinued        1,750 mg 175 mL/hr over 120 Minutes Intravenous Every 24 hours 05/27/21 0759 05/28/21 0849   05/27/21 2200  hydroxychloroquine (PLAQUENIL) tablet 200 mg        200 mg Oral 2 times daily 05/27/21 2004     05/27/21 1800  vancomycin (VANCOREADY) IVPB 1500 mg/300 mL  Status:  Discontinued        1,500 mg 150 mL/hr over 120 Minutes Intravenous Every 24 hours 05/26/21 1812 05/27/21 0757   05/27/21 0800  ceFEPIme (MAXIPIME) 2 g in sodium chloride 0.9 % 100 mL IVPB  Status:  Discontinued        2 g 200 mL/hr over 30 Minutes Intravenous Every 8 hours 05/27/21 0749 05/28/21 0849   05/27/21 0800  vancomycin (VANCOCIN) IVPB 1000 mg/200 mL premix        1,000 mg 200 mL/hr over 60 Minutes Intravenous  Once 05/27/21 0757 05/27/21 0948   05/26/21 2200  ceFEPIme (MAXIPIME) 2 g in sodium chloride 0.9 % 100 mL IVPB  Status:  Discontinued        2 g 200 mL/hr over 30 Minutes Intravenous Every 8 hours 05/26/21 1812 05/27/21 0749   05/26/21 1815  vancomycin (VANCOCIN) IVPB 1000 mg/200 mL premix        1,000 mg 200 mL/hr over 60 Minutes Intravenous Every hour 05/26/21 1812 05/26/21 2014   05/26/21 1745  vancomycin (VANCOCIN) 2,500 mg in sodium chloride 0.9 % 500 mL IVPB  Status:  Discontinued        2,500 mg 250 mL/hr over 120 Minutes Intravenous  Once 05/26/21 1741 05/26/21 1812        MEDICATIONS: Scheduled Meds:  acetaminophen  650 mg Oral Q6H   aspirin EC  81 mg Oral Daily   diltiazem  240 mg Oral Daily   enoxaparin (LOVENOX) injection  0.5 mg/kg Subcutaneous Daily   ezetimibe  10 mg Oral Daily   feeding supplement (GLUCERNA SHAKE)  237 mL Oral TID BM   gabapentin  400 mg Oral BID WC   gabapentin  800 mg Oral QHS   hydroxychloroquine  200 mg Oral BID   insulin aspart  0-20 Units Subcutaneous TID WC   insulin aspart  0-5 Units Subcutaneous QHS   insulin aspart  15 Units Subcutaneous TID WC    insulin glargine-yfgn  42 Units Subcutaneous Daily   irbesartan  150 mg Oral Daily   isosorbide mononitrate  30 mg Oral Daily   levothyroxine  200 mcg Oral QAC breakfast   loratadine  10 mg Oral QHS   multivitamin with minerals  1 tablet Oral Daily   mupirocin ointment   Topical TID   pramipexole  1 mg Oral BID   saccharomyces boulardii  250 mg Oral BID   Vitamin D (Ergocalciferol)  50,000 Units Oral Q7 days   Continuous Infusions:  ampicillin-sulbactam (UNASYN) IV 3 g (05/31/21 0847)   PRN Meds:.acetaminophen **OR** acetaminophen, albuterol, cyclobenzaprine, fluticasone,  magnesium oxide, nitroGLYCERIN, oxyCODONE, pantoprazole, pramipexole, promethazine   I have personally reviewed following labs and imaging studies  LABORATORY DATA: CBC: Recent Labs  Lab 05/26/21 1700 05/27/21 0645 05/28/21 0057 05/29/21 0052  WBC 20.9* 11.1* 7.1 6.4  NEUTROABS 19.0*  --   --   --   HGB 12.6 11.4* 10.4* 10.3*  HCT 40.7 36.6 35.2* 34.8*  MCV 82.4 82.2 85.0 85.3  PLT 278 230 231 229     Basic Metabolic Panel: Recent Labs  Lab 05/26/21 1700 05/27/21 0645 05/28/21 0057 05/29/21 0052  NA 136 136 135 136  K 4.9 3.7 3.7 3.7  CL 98 101 97* 98  CO2 24 26 27 27   GLUCOSE 207* 232* 236* 279*  BUN 9 9 8 10   CREATININE 1.05* 0.90 1.00 1.15*  CALCIUM 9.4 8.7* 8.8* 8.6*     GFR: Estimated Creatinine Clearance: 74.8 mL/min (A) (by C-G formula based on SCr of 1.15 mg/dL (H)).  Liver Function Tests: Recent Labs  Lab 05/26/21 1700 05/28/21 0057  AST 42* 76*  ALT 33 36  ALKPHOS 48 37*  BILITOT 0.7 1.1  PROT 7.8 6.1*  ALBUMIN 3.9 2.9*    No results for input(s): LIPASE, AMYLASE in the last 168 hours. No results for input(s): AMMONIA in the last 168 hours.  Coagulation Profile: Recent Labs  Lab 05/26/21 1745  INR 1.1     Cardiac Enzymes: No results for input(s): CKTOTAL, CKMB, CKMBINDEX, TROPONINI in the last 168 hours.  BNP (last 3 results) No results for input(s):  PROBNP in the last 8760 hours.  Lipid Profile: No results for input(s): CHOL, HDL, LDLCALC, TRIG, CHOLHDL, LDLDIRECT in the last 72 hours.  Thyroid Function Tests: No results for input(s): TSH, T4TOTAL, FREET4, T3FREE, THYROIDAB in the last 72 hours.  Anemia Panel: No results for input(s): VITAMINB12, FOLATE, FERRITIN, TIBC, IRON, RETICCTPCT in the last 72 hours.  Urine analysis:    Component Value Date/Time   COLORURINE YELLOW 02/03/2020 1630   APPEARANCEUR CLEAR 02/03/2020 1630   LABSPEC 1.008 02/03/2020 1630   PHURINE 5.0 02/03/2020 1630   GLUCOSEU NEGATIVE 02/03/2020 1630   HGBUR NEGATIVE 02/03/2020 1630   BILIRUBINUR NEGATIVE 02/03/2020 1630   KETONESUR NEGATIVE 02/03/2020 1630   PROTEINUR NEGATIVE 02/03/2020 1630   UROBILINOGEN 0.2 02/10/2015 1835   NITRITE NEGATIVE 02/03/2020 1630   LEUKOCYTESUR NEGATIVE 02/03/2020 1630    Sepsis Labs: Lactic Acid, Venous    Component Value Date/Time   LATICACIDVEN 2.1 (HH) 05/27/2021 0643    MICROBIOLOGY: Recent Results (from the past 240 hour(s))  Blood Culture (routine x 2)     Status: Abnormal   Collection Time: 05/26/21  5:00 PM   Specimen: Left Antecubital; Blood  Result Value Ref Range Status   Specimen Description   Final    LEFT ANTECUBITAL Performed at [facility], [address] [place], [facility] [place]    Special Requests   Final    BOTTLES DRAWN AEROBIC AND ANAEROBIC Blood Culture adequate volume Performed at [facility], [address] [place], [address] [place]    Culture  Setup Time   Final    GRAM POSITIVE COCCI IN CHAINS AEROBIC BOTTLE ONLY CRITICAL RESULT CALLED TO, READ BACK BY AND VERIFIED [facility] [facility] Celedonio Miyamoto 05/27/21 A BROWNING Performed at [facility], [address], [place], [address] [place]    Culture ENTEROCOCCUS FAECALIS (A)  Final   Report Status 05/29/2021 FINAL  Final  Organism ID, Bacteria ENTEROCOCCUS FAECALIS  Final      Susceptibility    Enterococcus faecalis - MIC*    AMPICILLIN <=2 SENSITIVE Sensitive     VANCOMYCIN 1 SENSITIVE Sensitive     GENTAMICIN SYNERGY SENSITIVE Sensitive     * ENTEROCOCCUS FAECALIS  Blood Culture (routine x 2)     Status: None   Collection Time: 05/26/21  5:00 PM   Specimen: Left Antecubital; Blood  Result Value Ref Range Status   Specimen Description   Final    LEFT ANTECUBITAL Performed at [facility], [address] [place], [place] [address]    Special Requests   Final    BOTTLES DRAWN AEROBIC AND ANAEROBIC Blood Culture adequate volume Performed at [facility], [address] [place], [place] [address]    Culture   Final    NO GROWTH 5 DAYS Performed at [facility], [address], [place], [place] [address]    Report Status 05/31/2021 FINAL  Final  Resp Panel by RT-PCR (Flu A&B, Covid) Nasopharyngeal Swab     Status: None   Collection Time: 05/26/21  5:00 PM   Specimen: Nasopharyngeal Swab; Nasopharyngeal(NP) swabs in vial transport medium  Result Value Ref Range Status   SARS Coronavirus 2 by RT PCR NEGATIVE NEGATIVE Final    Comment: (NOTE) SARS-CoV-2 target nucleic acids are NOT DETECTED.  The SARS-CoV-2 RNA is generally detectable in upper respiratory specimens during the acute phase of infection. The lowest concentration of SARS-CoV-2 viral copies this assay can detect is 138 copies/mL. A negative result does not preclude SARS-Cov-2 infection and should not be used as the sole basis for treatment or other patient management decisions. A negative result may occur with  improper specimen collection/handling, submission of specimen other than nasopharyngeal swab, presence of viral mutation(s) within the areas targeted by this assay, and inadequate number of viral copies(<138 copies/mL). A negative result must be combined with clinical observations, patient history, and epidemiological information. The expected result is Negative.  Fact  Sheet for Patients:  BloggerCourse.com  Fact Sheet for Healthcare Providers:  SeriousBroker.it  This test is no t yet approved or cleared by the [place] FDA and  has been authorized for detection and/or diagnosis of SARS-CoV-2 by FDA under an Emergency Use Authorization (EUA). This EUA will remain  in effect (meaning this test can be used) for the duration of the COVID-19 declaration under Section 564(b)(1) of the Act, 21 U.S.C.section 360bbb-3(b)(1), unless the authorization is terminated  or revoked sooner.       Influenza A by PCR NEGATIVE NEGATIVE Final   Influenza B by PCR NEGATIVE NEGATIVE Final    Comment: (NOTE) The Xpert Xpress SARS-CoV-2/FLU/RSV plus assay is intended as an aid in the diagnosis of influenza from Nasopharyngeal swab specimens and should not be used as a sole basis for treatment. Nasal washings and aspirates are unacceptable for Xpert Xpress SARS-CoV-2/FLU/RSV testing.  Fact Sheet for Patients: BloggerCourse.com  Fact Sheet for Healthcare Providers: SeriousBroker.it  This test is not yet approved or cleared by the [place] FDA and has been authorized for detection and/or diagnosis of SARS-CoV-2 by FDA under an Emergency Use Authorization (EUA). This EUA will remain in effect (meaning this test can be used) for the duration of the COVID-19 declaration under Section 564(b)(1) of the Act, 21 U.S.C. section 360bbb-3(b)(1), unless the authorization is terminated or revoked.  Performed at [facility], [address]  [place], [place] [address]   Blood Culture ID Panel (Reflexed)     Status: Abnormal   Collection Time: 05/26/21  5:00 PM  Result Value Ref Range Status   Enterococcus faecalis DETECTED (A) NOT DETECTED Final    Comment: CRITICAL RESULT CALLED TO, READ BACK BY AND VERIFIED WITH: E BREWINGTON PHARMD 1624 05/27/21 A  BROWNING    Enterococcus Faecium NOT DETECTED NOT DETECTED Final   Listeria monocytogenes NOT DETECTED NOT DETECTED Final   Staphylococcus species NOT DETECTED NOT DETECTED Final   Staphylococcus aureus (BCID) NOT DETECTED NOT DETECTED Final   Staphylococcus epidermidis NOT DETECTED NOT DETECTED Final   Staphylococcus lugdunensis NOT DETECTED NOT DETECTED Final   Streptococcus species NOT DETECTED NOT DETECTED Final   Streptococcus agalactiae NOT DETECTED NOT DETECTED Final   Streptococcus pneumoniae NOT DETECTED NOT DETECTED Final   Streptococcus pyogenes NOT DETECTED NOT DETECTED Final   A.calcoaceticus-baumannii NOT DETECTED NOT DETECTED Final   Bacteroides fragilis NOT DETECTED NOT DETECTED Final   Enterobacterales NOT DETECTED NOT DETECTED Final   Enterobacter cloacae complex NOT DETECTED NOT DETECTED Final   Escherichia coli NOT DETECTED NOT DETECTED Final   Klebsiella aerogenes NOT DETECTED NOT DETECTED Final   Klebsiella oxytoca NOT DETECTED NOT DETECTED Final   Klebsiella pneumoniae NOT DETECTED NOT DETECTED Final   Proteus species NOT DETECTED NOT DETECTED Final   Salmonella species NOT DETECTED NOT DETECTED Final   Serratia marcescens NOT DETECTED NOT DETECTED Final   Haemophilus influenzae NOT DETECTED NOT DETECTED Final   Neisseria meningitidis NOT DETECTED NOT DETECTED Final   Pseudomonas aeruginosa NOT DETECTED NOT DETECTED Final   Stenotrophomonas maltophilia NOT DETECTED NOT DETECTED Final   Candida albicans NOT DETECTED NOT DETECTED Final   Candida auris NOT DETECTED NOT DETECTED Final   Candida glabrata NOT DETECTED NOT DETECTED Final   Candida krusei NOT DETECTED NOT DETECTED Final   Candida parapsilosis NOT DETECTED NOT DETECTED Final   Candida tropicalis NOT DETECTED NOT DETECTED Final   Cryptococcus neoformans/gattii NOT DETECTED NOT DETECTED Final   Vancomycin resistance NOT DETECTED NOT DETECTED Final    Comment: Performed at [facility], [address] [place] [address] [place], [place] [address]  Culture, blood (routine x 2)     Status: None (Preliminary result)   Collection Time: 05/28/21 12:57 AM   Specimen: BLOOD RIGHT HAND  Result Value Ref Range Status   Specimen Description BLOOD RIGHT HAND  Final   Special Requests   Final    BOTTLES DRAWN AEROBIC AND ANAEROBIC Blood Culture adequate volume   Culture   Final    NO GROWTH 3 DAYS Performed at [facility], [address] [place], [place] [address]    Report Status PENDING  Incomplete  Culture, blood (routine x 2)     Status: None (Preliminary result)   Collection Time: 05/28/21 12:57 AM   Specimen: BLOOD  Result Value Ref Range Status   Specimen Description BLOOD RIGHT ANTECUBITAL  Final   Special Requests   Final    BOTTLES DRAWN AEROBIC AND ANAEROBIC Blood Culture adequate volume   Culture   Final    NO GROWTH 3 DAYS Performed at [facility], [address] [place], [place] [address]    Report Status PENDING  Incomplete    RADIOLOGY STUDIES/RESULTS: No results found.   LOS: 4 days   Jeoffrey Massed, MD  [facility]    To contact the attending provider between 7A-7P or the covering  provider during after hours 7P-7A, please log into the web site [email or web address] and access using universal [place] password for that web site. If you do not have the password, please call the hospital operator.  05/31/2021, 12:17 PM

## 2021-05-31 NOTE — Progress Notes (Signed)
[facility]  Date of Admission:  05/26/2021           Reason for visit: Follow up on Enterococcus faecalis bacteremia  Current antibiotics: Unasyn 9/17- present   Previous antibiotics: Vanc 9/15-9/17 Cefepime 9/15-9/17  ASSESSMENT:    60 y.o. 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  She is on the schedule for BKA tomorrow with Dr. Lajoyce Corners as well as TEE to exclude infective endocarditis. 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 difficile: 5 to 6 years ago per her report and states that she does well with Florastor when on antibiotics. Severe sepsis: Secondary to #1 with sepsis physiology resolved  RECOMMENDATIONS:    Continue Unasyn Follow-up repeat blood cultures which are no growth to date Follow-up TEE tomorrow Definitive source control with BKA planned for tomorrow as well Continue Florastor Wound care, glycemic control Will follow    Principal Problem:   Bacteremia due to Enterococcus Active Problems:   Hyperlipidemia   Mixed connective tissue disease (HCC)   Hypothyroidism   Uncomplicated asthma   Benign essential HTN   Chronic pain associated with significant psychosocial dysfunction   Gastroesophageal reflux disease   Diabetes mellitus type 2 in obese (HCC)   Cellulitis of right foot   CAD S/P percutaneous coronary angioplasty   Foot osteomyelitis (HCC)   Sepsis (HCC)    MEDICATIONS:    Scheduled Meds: . acetaminophen  650 mg Oral Q6H  . aspirin EC  81 mg Oral Daily  . diltiazem  240 mg Oral Daily  . enoxaparin (LOVENOX) injection  0.5 mg/kg Subcutaneous Daily  . ezetimibe  10 mg Oral Daily  . feeding supplement (GLUCERNA SHAKE)  237 mL Oral TID BM  . gabapentin  400 mg Oral BID WC  . gabapentin  800 mg Oral QHS  . hydroxychloroquine  200 mg Oral BID  .  insulin aspart  0-20 Units Subcutaneous TID WC  . insulin aspart  0-5 Units Subcutaneous QHS  . insulin aspart  15 Units Subcutaneous TID WC  . insulin glargine-yfgn  42 Units Subcutaneous Daily  . irbesartan  150 mg Oral Daily  . isosorbide mononitrate  30 mg Oral Daily  . levothyroxine  200 mcg Oral QAC breakfast  . loratadine  10 mg Oral QHS  . multivitamin with minerals  1 tablet Oral Daily  . mupirocin ointment   Topical TID  . pramipexole  1 mg Oral BID  . saccharomyces boulardii  250 mg Oral BID  . Vitamin D (Ergocalciferol)  50,000 Units Oral Q7 days   Continuous Infusions: . ampicillin-sulbactam (UNASYN) IV 3 g (05/31/21 0847)   PRN Meds:.acetaminophen **OR** acetaminophen, albuterol, cyclobenzaprine, fluticasone, magnesium oxide, nitroGLYCERIN, oxyCODONE, pantoprazole, pramipexole, promethazine  SUBJECTIVE:   24 hour events:  No new complaints today.  Discussed surgical plans for tomorrow along with TEE.  No other issues today.   Review of Systems  All other systems reviewed and are negative.    OBJECTIVE:   Blood pressure (!) 141/73, pulse 80, temperature 98.1 F (36.7 C), temperature source Oral, resp. rate 17, height 5\' 2"  (1.575 m), weight (!) 149.7 kg, SpO2 97 %. Body mass index is 60.36 kg/m.  Physical Exam Constitutional:      General: She is not in acute distress.    Appearance: Normal appearance.  HENT:  Head: Normocephalic and atraumatic.  Eyes:     Extraocular Movements: Extraocular movements intact.     Conjunctiva/sclera: Conjunctivae normal.  Pulmonary:     Effort: Pulmonary effort is normal. No respiratory distress.  Musculoskeletal:     Cervical back: Neck supple. No rigidity.  Neurological:     General: No focal deficit present.     Mental Status: She is alert and oriented to person, place, and time.  Psychiatric:        Mood and Affect: Mood normal.        Behavior: Behavior normal.     Lab Results: Lab Results  Component  Value Date   WBC 6.4 05/29/2021   HGB 10.3 (L) 05/29/2021   HCT 34.8 (L) 05/29/2021   MCV 85.3 05/29/2021   PLT 229 05/29/2021    Lab Results  Component Value Date   NA 136 05/29/2021   K 3.7 05/29/2021   CO2 27 05/29/2021   GLUCOSE 279 (H) 05/29/2021   BUN 10 05/29/2021   CREATININE 1.15 (H) 05/29/2021   CALCIUM 8.6 (L) 05/29/2021   GFRNONAA 55 (L) 05/29/2021   GFRAA 87 05/04/2020    Lab Results  Component Value Date   ALT 36 05/28/2021   AST 76 (H) 05/28/2021   ALKPHOS 37 (L) 05/28/2021   BILITOT 1.1 05/28/2021       Component Value Date/Time   CRP 9.8 (H) 01/02/2018 1809       Component Value Date/Time   ESRSEDRATE 37 (H) 01/02/2018 1809     I have reviewed the micro and lab results in Epic.  Imaging: No results found.   Imaging independently reviewed in Epic.    Vedia Coffer for Infectious Disease [facility] [phone] pager 05/31/2021, 10:47 AM

## 2021-06-01 ENCOUNTER — Inpatient Hospital Stay (HOSPITAL_COMMUNITY): Payer: 59

## 2021-06-01 ENCOUNTER — Inpatient Hospital Stay (HOSPITAL_COMMUNITY): Payer: 59 | Admitting: Anesthesiology

## 2021-06-01 ENCOUNTER — Encounter (HOSPITAL_COMMUNITY): Payer: Self-pay | Admitting: Internal Medicine

## 2021-06-01 ENCOUNTER — Encounter (HOSPITAL_COMMUNITY)
Admission: EM | Disposition: A | Discharge status: Rehabilitation Facility | Payer: Self-pay | Source: Home / Self Care | Attending: Internal Medicine

## 2021-06-01 DIAGNOSIS — R7881 Bacteremia: Secondary | ICD-10-CM

## 2021-06-01 DIAGNOSIS — E1169 Type 2 diabetes mellitus with other specified complication: Secondary | ICD-10-CM | POA: Diagnosis not present

## 2021-06-01 DIAGNOSIS — M869 Osteomyelitis, unspecified: Secondary | ICD-10-CM

## 2021-06-01 DIAGNOSIS — G894 Chronic pain syndrome: Secondary | ICD-10-CM | POA: Diagnosis not present

## 2021-06-01 DIAGNOSIS — I10 Essential (primary) hypertension: Secondary | ICD-10-CM

## 2021-06-01 DIAGNOSIS — L03115 Cellulitis of right lower limb: Secondary | ICD-10-CM | POA: Diagnosis not present

## 2021-06-01 DIAGNOSIS — B952 Enterococcus as the cause of diseases classified elsewhere: Secondary | ICD-10-CM | POA: Diagnosis not present

## 2021-06-01 HISTORY — PX: TEE WITHOUT CARDIOVERSION: SHX5443

## 2021-06-01 HISTORY — PX: AMPUTATION: SHX166

## 2021-06-01 LAB — CBC
HCT: 37.6 % (ref 36.0–46.0)
Hemoglobin: 11.7 g/dL — ABNORMAL LOW (ref 12.0–15.0)
MCH: 26.4 pg (ref 26.0–34.0)
MCHC: 31.1 g/dL (ref 30.0–36.0)
MCV: 84.7 fL (ref 80.0–100.0)
Platelets: 241 10*3/uL (ref 150–400)
RBC: 4.44 MIL/uL (ref 3.87–5.11)
RDW: 16.6 % — ABNORMAL HIGH (ref 11.5–15.5)
WBC: 7.5 10*3/uL (ref 4.0–10.5)
nRBC: 0 % (ref 0.0–0.2)

## 2021-06-01 LAB — GLUCOSE, CAPILLARY
Glucose-Capillary: 269 mg/dL — ABNORMAL HIGH (ref 70–99)
Glucose-Capillary: 253 mg/dL — ABNORMAL HIGH (ref 70–99)
Glucose-Capillary: 224 mg/dL — ABNORMAL HIGH (ref 70–99)
Glucose-Capillary: 215 mg/dL — ABNORMAL HIGH (ref 70–99)
Glucose-Capillary: 215 mg/dL — ABNORMAL HIGH (ref 70–99)
Glucose-Capillary: 283 mg/dL — ABNORMAL HIGH (ref 70–99)

## 2021-06-01 LAB — BASIC METABOLIC PANEL
Anion gap: 9 (ref 5–15)
BUN: 13 mg/dL (ref 6–20)
CO2: 27 mmol/L (ref 22–32)
Calcium: 9.1 mg/dL (ref 8.9–10.3)
Chloride: 96 mmol/L — ABNORMAL LOW (ref 98–111)
Creatinine, Ser: 1.02 mg/dL — ABNORMAL HIGH (ref 0.44–1.00)
GFR, Estimated: >60 mL/min (ref >60)
Glucose, Bld: 306 mg/dL — ABNORMAL HIGH (ref 70–99)
Potassium: 4.5 mmol/L (ref 3.5–5.1)
Sodium: 132 mmol/L — ABNORMAL LOW (ref 135–145)

## 2021-06-01 SURGERY — AMPUTATION BELOW KNEE
Anesthesia: Regional | Site: Knee | Laterality: Right

## 2021-06-01 SURGERY — ECHOCARDIOGRAM, TRANSESOPHAGEAL
Anesthesia: Monitor Anesthesia Care

## 2021-06-01 MED ORDER — ROCURONIUM BROMIDE 10 MG/ML (PF) SYRINGE
PREFILLED_SYRINGE | INTRAVENOUS | Status: AC
  Filled 2021-06-01: qty 10

## 2021-06-01 MED ORDER — ROPIVACAINE HCL 5 MG/ML IJ SOLN
INTRAMUSCULAR | Status: DC | PRN
  Administered 2021-06-01: 40 mL via PERINEURAL

## 2021-06-01 MED ORDER — PHENOL 1.4 % MT LIQD
1.0000 | OROMUCOSAL | Status: DC | PRN
  Administered 2021-06-01: 1 via OROMUCOSAL
  Filled 2021-06-01: qty 177

## 2021-06-01 MED ORDER — BUPIVACAINE LIPOSOME 1.3 % IJ SUSP
INTRAMUSCULAR | Status: DC | PRN
  Administered 2021-06-01: 10 mL via PERINEURAL

## 2021-06-01 MED ORDER — ONDANSETRON HCL 4 MG/2ML IJ SOLN
INTRAMUSCULAR | Status: DC | PRN
  Administered 2021-06-01: 4 mg via INTRAVENOUS

## 2021-06-01 MED ORDER — SUCCINYLCHOLINE CHLORIDE 200 MG/10ML IV SOSY
PREFILLED_SYRINGE | INTRAVENOUS | Status: DC | PRN
  Administered 2021-06-01: 200 mg via INTRAVENOUS

## 2021-06-01 MED ORDER — CHLORHEXIDINE GLUCONATE 4 % EX LIQD: 60.0000 mL | Freq: Once | CUTANEOUS | Status: DC

## 2021-06-01 MED ORDER — PHENYLEPHRINE HCL (PRESSORS) 10 MG/ML IV SOLN
INTRAVENOUS | Status: DC | PRN
  Administered 2021-06-01 (×2): 80 ug via INTRAVENOUS

## 2021-06-01 MED ORDER — ACETAMINOPHEN 500 MG PO TABS
1000.0000 mg | ORAL_TABLET | Freq: Once | ORAL | Status: AC
  Administered 2021-06-01: 1000 mg via ORAL

## 2021-06-01 MED ORDER — MIDAZOLAM HCL 2 MG/2ML IJ SOLN
INTRAMUSCULAR | Status: AC
  Filled 2021-06-01: qty 2

## 2021-06-01 MED ORDER — ONDANSETRON HCL 4 MG/2ML IJ SOLN
INTRAMUSCULAR | Status: AC
  Filled 2021-06-01: qty 2

## 2021-06-01 MED ORDER — SODIUM CHLORIDE 0.9 % IV SOLN: INTRAVENOUS | Status: DC

## 2021-06-01 MED ORDER — SUCCINYLCHOLINE CHLORIDE 200 MG/10ML IV SOSY
PREFILLED_SYRINGE | INTRAVENOUS | Status: AC
  Filled 2021-06-01: qty 10

## 2021-06-01 MED ORDER — POVIDONE-IODINE 10 % EX SWAB: 2.0000 "application " | Freq: Once | CUTANEOUS | Status: DC

## 2021-06-01 MED ORDER — FENTANYL CITRATE (PF) 250 MCG/5ML IJ SOLN
INTRAMUSCULAR | Status: DC | PRN
  Administered 2021-06-01: 100 ug via INTRAVENOUS

## 2021-06-01 MED ORDER — ORAL CARE MOUTH RINSE: 15.0000 mL | Freq: Once | OROMUCOSAL | Status: AC

## 2021-06-01 MED ORDER — PROPOFOL 10 MG/ML IV BOLUS
INTRAVENOUS | Status: DC | PRN
  Administered 2021-06-01: 200 mg via INTRAVENOUS

## 2021-06-01 MED ORDER — FENTANYL CITRATE (PF) 100 MCG/2ML IJ SOLN
INTRAMUSCULAR | Status: AC
  Administered 2021-06-01: 50 ug via INTRAVENOUS
  Filled 2021-06-01: qty 2

## 2021-06-01 MED ORDER — SUGAMMADEX SODIUM 200 MG/2ML IV SOLN
INTRAVENOUS | Status: DC | PRN
  Administered 2021-06-01: 400 mg via INTRAVENOUS

## 2021-06-01 MED ORDER — FENTANYL CITRATE (PF) 100 MCG/2ML IJ SOLN: 50.0000 ug | Freq: Once | INTRAMUSCULAR | Status: AC

## 2021-06-01 MED ORDER — ACETAMINOPHEN 500 MG PO TABS
ORAL_TABLET | ORAL | Status: AC
  Filled 2021-06-01: qty 2

## 2021-06-01 MED ORDER — PHENYLEPHRINE 40 MCG/ML (10ML) SYRINGE FOR IV PUSH (FOR BLOOD PRESSURE SUPPORT)
PREFILLED_SYRINGE | INTRAVENOUS | Status: AC
  Filled 2021-06-01: qty 10

## 2021-06-01 MED ORDER — MIDAZOLAM HCL 2 MG/2ML IJ SOLN
INTRAMUSCULAR | Status: AC
  Administered 2021-06-01: 1 mg via INTRAVENOUS
  Filled 2021-06-01: qty 2

## 2021-06-01 MED ORDER — LIDOCAINE HCL (PF) 2 % IJ SOLN
INTRAMUSCULAR | Status: AC
  Filled 2021-06-01: qty 5

## 2021-06-01 MED ORDER — 0.9 % SODIUM CHLORIDE (POUR BTL) OPTIME
TOPICAL | Status: DC | PRN
Start: 2021-06-01 — End: 2021-06-01
  Administered 2021-06-01: 1000 mL

## 2021-06-01 MED ORDER — CHLORHEXIDINE GLUCONATE 0.12 % MT SOLN
15.0000 mL | Freq: Once | OROMUCOSAL | Status: AC
  Administered 2021-06-01: 15 mL via OROMUCOSAL
  Filled 2021-06-01: qty 15

## 2021-06-01 MED ORDER — FENTANYL CITRATE (PF) 250 MCG/5ML IJ SOLN
INTRAMUSCULAR | Status: AC
  Filled 2021-06-01: qty 5

## 2021-06-01 MED ORDER — LIDOCAINE HCL (PF) 2 % IJ SOLN
INTRAMUSCULAR | Status: DC | PRN
  Administered 2021-06-01: 60 mg via INTRADERMAL

## 2021-06-01 MED ORDER — LACTATED RINGERS IV SOLN: INTRAVENOUS | Status: DC

## 2021-06-01 MED ORDER — PROPOFOL 10 MG/ML IV BOLUS
INTRAVENOUS | Status: AC
  Filled 2021-06-01: qty 20

## 2021-06-01 MED ORDER — ROCURONIUM BROMIDE 10 MG/ML (PF) SYRINGE
PREFILLED_SYRINGE | INTRAVENOUS | Status: DC | PRN
  Administered 2021-06-01: 50 mg via INTRAVENOUS

## 2021-06-01 MED ORDER — MIDAZOLAM HCL 2 MG/2ML IJ SOLN: 1.0000 mg | Freq: Once | INTRAMUSCULAR | Status: AC

## 2021-06-01 SURGICAL SUPPLY — 37 items
BAG COUNTER SPONGE SURGICOUNT (BAG) IMPLANT
BAG SPNG CNTER NS LX DISP (BAG)
BLADE SAW RECIP 87.9 MT (BLADE) ×3 IMPLANT
BLADE SURG 21 STRL SS (BLADE) ×3 IMPLANT
BNDG COHESIVE 6X5 TAN STRL LF (GAUZE/BANDAGES/DRESSINGS) IMPLANT
CANISTER WOUND CARE 500ML ATS (WOUND CARE) ×3 IMPLANT
COVER SURGICAL LIGHT HANDLE (MISCELLANEOUS) ×3 IMPLANT
CUFF TOURN SGL QUICK 34 (TOURNIQUET CUFF) ×3
CUFF TRNQT CYL 34X4.125X (TOURNIQUET CUFF) ×2 IMPLANT
DRAPE INCISE IOBAN 66X45 STRL (DRAPES) ×3 IMPLANT
DRAPE U-SHAPE 47X51 STRL (DRAPES) ×3 IMPLANT
DRESSING PREVENA PLUS CUSTOM (GAUZE/BANDAGES/DRESSINGS) ×2 IMPLANT
DRSG PREVENA PLUS CUSTOM (GAUZE/BANDAGES/DRESSINGS) ×3
DURAPREP 26ML APPLICATOR (WOUND CARE) ×3 IMPLANT
ELECT REM PT RETURN 9FT ADLT (ELECTROSURGICAL) ×3
ELECTRODE REM PT RTRN 9FT ADLT (ELECTROSURGICAL) ×2 IMPLANT
GLOVE SURG ORTHO LTX SZ9 (GLOVE) ×3 IMPLANT
GLOVE SURG UNDER POLY LF SZ9 (GLOVE) ×3 IMPLANT
GOWN STRL REUS W/ TWL XL LVL3 (GOWN DISPOSABLE) ×4 IMPLANT
GOWN STRL REUS W/TWL XL LVL3 (GOWN DISPOSABLE) ×6
KIT BASIN OR (CUSTOM PROCEDURE TRAY) ×3 IMPLANT
KIT TURNOVER KIT B (KITS) ×3 IMPLANT
MANIFOLD NEPTUNE II (INSTRUMENTS) ×3 IMPLANT
NS IRRIG 1000ML POUR BTL (IV SOLUTION) ×3 IMPLANT
PACK ORTHO EXTREMITY (CUSTOM PROCEDURE TRAY) ×3 IMPLANT
PAD ARMBOARD 7.5X6 YLW CONV (MISCELLANEOUS) ×3 IMPLANT
PREVENA RESTOR ARTHOFORM 46X30 (CANNISTER) ×3 IMPLANT
SPONGE T-LAP 18X18 ~~LOC~~+RFID (SPONGE) IMPLANT
STAPLER VISISTAT 35W (STAPLE) IMPLANT
STOCKINETTE IMPERVIOUS LG (DRAPES) ×3 IMPLANT
SUT ETHILON 2 0 PSLX (SUTURE) IMPLANT
SUT SILK 2 0 (SUTURE) ×3
SUT SILK 2-0 18XBRD TIE 12 (SUTURE) ×2 IMPLANT
SUT VIC AB 1 CTX 27 (SUTURE) ×6 IMPLANT
TOWEL GREEN STERILE (TOWEL DISPOSABLE) ×3 IMPLANT
TUBE CONNECTING 12X1/4 (SUCTIONS) ×3 IMPLANT
YANKAUER SUCT BULB TIP NO VENT (SUCTIONS) ×3 IMPLANT

## 2021-06-01 NOTE — Interval H&P Note (Signed)
History and Physical Interval Note:  06/01/2021 10:45 AM  [name]  has presented today for surgery, with the diagnosis of Osteomyelitis Right Foot.  The various methods of treatment have been discussed with the patient and family. After consideration of risks, benefits and other options for treatment, the patient has consented to  Procedure(s): RIGHT BELOW KNEE AMPUTATION (Right) TRANSESOPHAGEAL ECHOCARDIOGRAM (TEE) (N/A) as a surgical intervention.  The patient's history has been reviewed, patient examined, no change in status, stable for surgery.  I have reviewed the patient's chart and labs.  Questions were answered to the patient's satisfaction.    Discussed transesophageal echocardiogram with [name]  Discussed risk and benefits of the procedure.  She has no contraindications to TEE.  We will do this while she is in the operating room for a right below-knee amputation.  The transesophageal echo will be performed before surgery by orthopedics.  All questions were answered.  No immediate issues to TEE or discomfort.  Shared Decision Making/Informed Consent The risks [esophageal damage, perforation (1:10,000 risk), bleeding, pharyngeal hematoma as well as other potential complications associated with conscious sedation including aspiration, arrhythmia, respiratory failure and death], benefits (treatment guidance and diagnostic support) and alternatives of a transesophageal echocardiogram were discussed in detail with [name] and she is willing to proceed.   Gerri Spore T. Flora Lipps, MD, [facility]  [facility]  [address], [address] [place], [place] [address] [phone]  10:46 AM

## 2021-06-01 NOTE — Progress Notes (Signed)
  Echocardiogram Echocardiogram Transesophageal has been performed.  Augustine Radar 06/01/2021, 11:55 AM

## 2021-06-01 NOTE — Progress Notes (Signed)
PROGRESS NOTE        PATIENT DETAILS Name: [name] Age: 60 y.o. Sex: female Date of Birth: 04-21-1961 Admit Date: 05/26/2021 Admitting Physician Eduard Clos, MD [id number], Jilda Roche, DO  Brief Narrative: Patient is a 61 y.o. female with history of DM-2, CAD, HTN, MCTD, chronic right foot ulcer (refused BKA in the past as wanted to try hyperbaric therapy)-presenting with fever/RLE erythema-found to have sepsis and Enterococcus bacteremia.  Subjective: No major issues overnight-right BKA today.  Objective: Vitals: Blood pressure (!) 155/67, pulse 88, temperature 97.8 F (36.6 C), temperature source Oral, resp. rate 15, height 5\' 2"  (1.575 m), weight (!) 149.7 kg, SpO2 93 %.   Exam: Gen Exam:Alert awake-not in any distress HEENT:atraumatic, normocephalic Chest: B/L clear to auscultation anteriorly CVS:S1S2 regular Abdomen:soft non tender, non distended Extremities: Continues to have mild RLE erythema. Neurology: Non focal Skin: no rash   Pertinent Labs/Radiology: NA: 132 K: 4.5 Creatinine: 1.02 WBC: 7.5 Hemoglobin: 11.7  Microbiology: 9/15>>Blood culture: 1/2: Enterococcus faecalis 9/17>> blood culture: No growth  Radiology/Echo: 9/15>> CT right foot: Chronic osteomyelitis at third metatarsal fracture site-soft tissue swelling consistent with cellulitis. 9/18>> TTE: EF 55-60%, no obvious vegetation.   Assessment/Plan: Sepsis due to RLE cellulitis/diabetic foot with Enterococcus bacteremia: Sepsis physiology has resolved-antibiotics narrowed to Unasyn-TTE negative for vegetation-TEE and right BKA scheduled for today.  ID following.    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  Dr. planning on BKA later today.    HTN: BP stable-continue Imdur, Avapro and Cardizem.  Follow and adjust.    Chronic pain  syndrome: At baseline-continue as needed oxycodone and Neurontin  CAD-s/p RCA PCI on 2019: No anginal symptoms-continue ASA  DM-2 (A1c 7.3 on 9/16): CBGs remain on the higher side-on Humulin R 500 sliding scale as outpatient-she is n.p.o. for BKA today-would continue current regimen of Lantus 42 units daily, 15 units of NovoLog with meals and SSI for today and reassess on 9/22.   Recent Labs    05/31/21 2011 06/01/21 0750 06/01/21 0948  GLUCAP 197* 269* 253*     HLD: Continue Zetia  MCTD: Continue Plaquenil  Hypothyroidism: Continue Synthroid  GERD: Continue PPI  Restless leg syndrome: Continue Mirapex  Bronchial asthma: Not in exacerbation-continue bronchodilators  Morbid Obesity: Estimated body mass index is 60.36 kg/m as calculated from the following:   Height as of this encounter: 5\' 2"  (1.575 m).   Weight as of this encounter: 149.7 kg.    Procedures :None Consults: DVT Prophylaxis: Lovenox Code Status:Full code  Family Communication:None at bedside  Time spent: 25 minutes-Greater than 50% of this time was spent in counseling, explanation of diagnosis, planning of further management, and coordination of care.  Diet: Diet Order             Diet NPO time specified  Diet effective midnight                      Disposition Plan: Status is: Inpatient  Remains inpatient appropriate because:Inpatient level of care appropriate due to severity of illness  Dispo: The patient is from: Home              Anticipated d/c is to: Home  Patient currently is not medically stable to d/c.   Difficult to place patient No    Barriers to Discharge: Enterococcal bacteremia-RLE-chronic osteomyelitis of right foot-needs BKA.  Antimicrobial agents: Anti-infectives (From admission, onward)    Start     Dose/Rate Route Frequency Ordered Stop   05/28/21 1630  [MAR Hold]  Ampicillin-Sulbactam (UNASYN) 3 g in sodium chloride 0.9 % 100 mL IVPB        (MAR  Hold since Wed 06/01/2021 at 0942.Hold Reason: Transfer to a Procedural area)   3 g 200 mL/hr over 30 Minutes Intravenous Every 6 hours 05/28/21 0946     05/27/21 2200  vancomycin (VANCOREADY) IVPB 1750 mg/350 mL  Status:  Discontinued        1,750 mg 175 mL/hr over 120 Minutes Intravenous Every 24 hours 05/27/21 0759 05/28/21 0849   05/27/21 2200  [MAR Hold]  hydroxychloroquine (PLAQUENIL) tablet 200 mg        (MAR Hold since Wed 06/01/2021 at 0942.Hold Reason: Transfer to a Procedural area)   200 mg Oral 2 times daily 05/27/21 2004     05/27/21 1800  vancomycin (VANCOREADY) IVPB 1500 mg/300 mL  Status:  Discontinued        1,500 mg 150 mL/hr over 120 Minutes Intravenous Every 24 hours 05/26/21 1812 05/27/21 0757   05/27/21 0800  ceFEPIme (MAXIPIME) 2 g in sodium chloride 0.9 % 100 mL IVPB  Status:  Discontinued        2 g 200 mL/hr over 30 Minutes Intravenous Every 8 hours 05/27/21 0749 05/28/21 0849   05/27/21 0800  vancomycin (VANCOCIN) IVPB 1000 mg/200 mL premix        1,000 mg 200 mL/hr over 60 Minutes Intravenous  Once 05/27/21 0757 05/27/21 0948   05/26/21 2200  ceFEPIme (MAXIPIME) 2 g in sodium chloride 0.9 % 100 mL IVPB  Status:  Discontinued        2 g 200 mL/hr over 30 Minutes Intravenous Every 8 hours 05/26/21 1812 05/27/21 0749   05/26/21 1815  vancomycin (VANCOCIN) IVPB 1000 mg/200 mL premix        1,000 mg 200 mL/hr over 60 Minutes Intravenous Every hour 05/26/21 1812 05/26/21 2014   05/26/21 1745  vancomycin (VANCOCIN) 2,500 mg in sodium chloride 0.9 % 500 mL IVPB  Status:  Discontinued        2,500 mg 250 mL/hr over 120 Minutes Intravenous  Once 05/26/21 1741 05/26/21 1812        MEDICATIONS: Scheduled Meds:  acetaminophen       [MAR Hold] acetaminophen  650 mg Oral Q6H   [MAR Hold] aspirin EC  81 mg Oral Daily   chlorhexidine  60 mL Topical Once   [MAR Hold] diltiazem  240 mg Oral Daily   [MAR Hold] enoxaparin (LOVENOX) injection  0.5 mg/kg Subcutaneous Daily    [MAR Hold] ezetimibe  10 mg Oral Daily   [MAR Hold] feeding supplement (GLUCERNA SHAKE)  237 mL Oral TID BM   [MAR Hold] gabapentin  400 mg Oral BID WC   [MAR Hold] gabapentin  800 mg Oral QHS   [MAR Hold] hydroxychloroquine  200 mg Oral BID   [MAR Hold] insulin aspart  0-20 Units Subcutaneous TID WC   [MAR Hold] insulin aspart  0-5 Units Subcutaneous QHS   [MAR Hold] insulin aspart  15 Units Subcutaneous TID WC   [MAR Hold] insulin glargine-yfgn  42 Units Subcutaneous Daily   [MAR Hold] irbesartan  150 mg Oral Daily   [  MAR Hold] isosorbide mononitrate  30 mg Oral Daily   [MAR Hold] levothyroxine  200 mcg Oral QAC breakfast   [MAR Hold] loratadine  10 mg Oral QHS   [MAR Hold] multivitamin with minerals  1 tablet Oral Daily   [MAR Hold] mupirocin ointment   Topical TID   povidone-iodine  2 application Topical Once   [MAR Hold] pramipexole  1 mg Oral BID   [MAR Hold] saccharomyces boulardii  250 mg Oral BID   [MAR Hold] Vitamin D (Ergocalciferol)  50,000 Units Oral Q7 days   Continuous Infusions:  sodium chloride 20 mL/hr at 06/01/21 0803   [MAR Hold] ampicillin-sulbactam (UNASYN) IV 3 g (06/01/21 0529)   lactated ringers 10 mL/hr at 06/01/21 1004   PRN Meds:.0.9 % irrigation (POUR BTL), [MAR Hold] acetaminophen **OR** [MAR Hold] acetaminophen, [MAR Hold] albuterol, [MAR Hold] cyclobenzaprine, [MAR Hold] fluticasone, [MAR Hold] magnesium oxide, [MAR Hold] nitroGLYCERIN, [MAR Hold] oxyCODONE, [MAR Hold] pantoprazole, [MAR Hold] pramipexole, [MAR Hold] promethazine   I have personally reviewed following labs and imaging studies  LABORATORY DATA: CBC: Recent Labs  Lab 05/26/21 1700 05/27/21 0645 05/28/21 0057 05/29/21 0052 06/01/21 0225  WBC 20.9* 11.1* 7.1 6.4 7.5  NEUTROABS 19.0*  --   --   --   --   HGB 12.6 11.4* 10.4* 10.3* 11.7*  HCT 40.7 36.6 35.2* 34.8* 37.6  MCV 82.4 82.2 85.0 85.3 84.7  PLT 278 230 231 229 241     Basic Metabolic Panel: Recent Labs  Lab  05/26/21 1700 05/27/21 0645 05/28/21 0057 05/29/21 0052 06/01/21 0225  NA 136 136 135 136 132*  K 4.9 3.7 3.7 3.7 4.5  CL 98 101 97* 98 96*  CO2 24 26 27 27 27   GLUCOSE 207* 232* 236* 279* 306*  BUN 9 9 8 10 13   CREATININE 1.05* 0.90 1.00 1.15* 1.02*  CALCIUM 9.4 8.7* 8.8* 8.6* 9.1     GFR: Estimated Creatinine Clearance: 84.3 mL/min (A) (by C-G formula based on SCr of 1.02 mg/dL (H)).  Liver Function Tests: Recent Labs  Lab 05/26/21 1700 05/28/21 0057  AST 42* 76*  ALT 33 36  ALKPHOS 48 37*  BILITOT 0.7 1.1  PROT 7.8 6.1*  ALBUMIN 3.9 2.9*    No results for input(s): LIPASE, AMYLASE in the last 168 hours. No results for input(s): AMMONIA in the last 168 hours.  Coagulation Profile: Recent Labs  Lab 05/26/21 1745  INR 1.1     Cardiac Enzymes: No results for input(s): CKTOTAL, CKMB, CKMBINDEX, TROPONINI in the last 168 hours.  BNP (last 3 results) No results for input(s): PROBNP in the last 8760 hours.  Lipid Profile: No results for input(s): CHOL, HDL, LDLCALC, TRIG, CHOLHDL, LDLDIRECT in the last 72 hours.  Thyroid Function Tests: No results for input(s): TSH, T4TOTAL, FREET4, T3FREE, THYROIDAB in the last 72 hours.  Anemia Panel: No results for input(s): VITAMINB12, FOLATE, FERRITIN, TIBC, IRON, RETICCTPCT in the last 72 hours.  Urine analysis:    Component Value Date/Time   COLORURINE YELLOW 02/03/2020 1630   APPEARANCEUR CLEAR 02/03/2020 1630   LABSPEC 1.008 02/03/2020 1630   PHURINE 5.0 02/03/2020 1630   GLUCOSEU NEGATIVE 02/03/2020 1630   HGBUR NEGATIVE 02/03/2020 1630   BILIRUBINUR NEGATIVE 02/03/2020 1630   KETONESUR NEGATIVE 02/03/2020 1630   PROTEINUR NEGATIVE 02/03/2020 1630   UROBILINOGEN 0.2 02/10/2015 1835   NITRITE NEGATIVE 02/03/2020 1630   LEUKOCYTESUR NEGATIVE 02/03/2020 1630    Sepsis Labs: Lactic Acid, Venous    Component  Value Date/Time   LATICACIDVEN 2.1 (HH) 05/27/2021 0643    MICROBIOLOGY: Recent Results (from  the past 240 hour(s))  Blood Culture (routine x 2)     Status: Abnormal   Collection Time: 05/26/21  5:00 PM   Specimen: Left Antecubital; Blood  Result Value Ref Range Status   Specimen Description   Final    LEFT ANTECUBITAL Performed at [facility], [address] [place], [place] [address]    Special Requests   Final    BOTTLES DRAWN AEROBIC AND ANAEROBIC Blood Culture adequate volume Performed at [facility], [address] [place], [place] [address]    Culture  Setup Time   Final    GRAM POSITIVE COCCI IN CHAINS AEROBIC BOTTLE ONLY CRITICAL RESULT CALLED TO, READ BACK BY AND VERIFIED WITHCeledonio Miyamoto [facility] 0867 05/27/21 A BROWNING Performed at [facility], [address], [place], [place] [address]    Culture ENTEROCOCCUS FAECALIS (A)  Final   Report Status 05/29/2021 FINAL  Final   Organism ID, Bacteria ENTEROCOCCUS FAECALIS  Final      Susceptibility   Enterococcus faecalis - MIC*    AMPICILLIN <=2 SENSITIVE Sensitive     VANCOMYCIN 1 SENSITIVE Sensitive     GENTAMICIN SYNERGY SENSITIVE Sensitive     * ENTEROCOCCUS FAECALIS  Blood Culture (routine x 2)     Status: None   Collection Time: 05/26/21  5:00 PM   Specimen: Left Antecubital; Blood  Result Value Ref Range Status   Specimen Description   Final    LEFT ANTECUBITAL Performed at [facility], [address] [place], [place] [address]    Special Requests   Final    BOTTLES DRAWN AEROBIC AND ANAEROBIC Blood Culture adequate volume Performed at [facility], [address] [place], [place] [address]    Culture   Final    NO GROWTH 5 DAYS Performed at [facility], [address], [place], [place] [address]    Report Status 05/31/2021 FINAL  Final  Resp Panel by RT-PCR (Flu A&B, Covid) Nasopharyngeal Swab     Status: None   Collection Time: 05/26/21  5:00 PM   Specimen: Nasopharyngeal Swab; Nasopharyngeal(NP) swabs in vial transport medium   Result Value Ref Range Status   SARS Coronavirus 2 by RT PCR NEGATIVE NEGATIVE Final    Comment: (NOTE) SARS-CoV-2 target nucleic acids are NOT DETECTED.  The SARS-CoV-2 RNA is generally detectable in upper respiratory specimens during the acute phase of infection. The lowest concentration of SARS-CoV-2 viral copies this assay can detect is 138 copies/mL. A negative result does not preclude SARS-Cov-2 infection and should not be used as the sole basis for treatment or other patient management decisions. A negative result may occur with  improper specimen collection/handling, submission of specimen other than nasopharyngeal swab, presence of viral mutation(s) within the areas targeted by this assay, and inadequate number of viral copies(<138 copies/mL). A negative result must be combined with clinical observations, patient history, and epidemiological information. The expected result is Negative.  Fact Sheet for Patients:  BloggerCourse.com  Fact Sheet for Healthcare Providers:  SeriousBroker.it  This test is no t yet approved or cleared by the [place] FDA and  has been authorized for detection and/or diagnosis of SARS-CoV-2 by FDA under an Emergency Use Authorization (EUA). This EUA will remain  in effect (meaning this test can be used) for the duration of  the COVID-19 declaration under Section 564(b)(1) of the Act, 21 U.S.C.section 360bbb-3(b)(1), unless the authorization is terminated  or revoked sooner.       Influenza A by PCR NEGATIVE NEGATIVE Final   Influenza B by PCR NEGATIVE NEGATIVE Final    Comment: (NOTE) The Xpert Xpress SARS-CoV-2/FLU/RSV plus assay is intended as an aid in the diagnosis of influenza from Nasopharyngeal swab specimens and should not be used as a sole basis for treatment. Nasal washings and aspirates are unacceptable for Xpert Xpress SARS-CoV-2/FLU/RSV testing.  Fact Sheet for  Patients: BloggerCourse.com  Fact Sheet for Healthcare Providers: SeriousBroker.it  This test is not yet approved or cleared by the [place] FDA and has been authorized for detection and/or diagnosis of SARS-CoV-2 by FDA under an Emergency Use Authorization (EUA). This EUA will remain in effect (meaning this test can be used) for the duration of the COVID-19 declaration under Section 564(b)(1) of the Act, 21 U.S.C. section 360bbb-3(b)(1), unless the authorization is terminated or revoked.  Performed at [facility], [address] [place], [place] [address]   Blood Culture ID Panel (Reflexed)     Status: Abnormal   Collection Time: 05/26/21  5:00 PM  Result Value Ref Range Status   Enterococcus faecalis DETECTED (A) NOT DETECTED Final    Comment: CRITICAL RESULT CALLED TO, READ BACK BY AND VERIFIED WITH: Celedonio Miyamoto PHARMD 1624 05/27/21 A BROWNING    Enterococcus Faecium NOT DETECTED NOT DETECTED Final   Listeria monocytogenes NOT DETECTED NOT DETECTED Final   Staphylococcus species NOT DETECTED NOT DETECTED Final   Staphylococcus aureus (BCID) NOT DETECTED NOT DETECTED Final   Staphylococcus epidermidis NOT DETECTED NOT DETECTED Final   Staphylococcus lugdunensis NOT DETECTED NOT DETECTED Final   Streptococcus species NOT DETECTED NOT DETECTED Final   Streptococcus agalactiae NOT DETECTED NOT DETECTED Final   Streptococcus pneumoniae NOT DETECTED NOT DETECTED Final   Streptococcus pyogenes NOT DETECTED NOT DETECTED Final   A.calcoaceticus-baumannii NOT DETECTED NOT DETECTED Final   Bacteroides fragilis NOT DETECTED NOT DETECTED Final   Enterobacterales NOT DETECTED NOT DETECTED Final   Enterobacter cloacae complex NOT DETECTED NOT DETECTED Final   Escherichia coli NOT DETECTED NOT DETECTED Final   Klebsiella aerogenes NOT DETECTED NOT DETECTED Final   Klebsiella oxytoca NOT DETECTED NOT DETECTED Final    Klebsiella pneumoniae NOT DETECTED NOT DETECTED Final   Proteus species NOT DETECTED NOT DETECTED Final   Salmonella species NOT DETECTED NOT DETECTED Final   Serratia marcescens NOT DETECTED NOT DETECTED Final   Haemophilus influenzae NOT DETECTED NOT DETECTED Final   Neisseria meningitidis NOT DETECTED NOT DETECTED Final   Pseudomonas aeruginosa NOT DETECTED NOT DETECTED Final   Stenotrophomonas maltophilia NOT DETECTED NOT DETECTED Final   Candida albicans NOT DETECTED NOT DETECTED Final   Candida auris NOT DETECTED NOT DETECTED Final   Candida glabrata NOT DETECTED NOT DETECTED Final   Candida krusei NOT DETECTED NOT DETECTED Final   Candida parapsilosis NOT DETECTED NOT DETECTED Final   Candida tropicalis NOT DETECTED NOT DETECTED Final   Cryptococcus neoformans/gattii NOT DETECTED NOT DETECTED Final   Vancomycin resistance NOT DETECTED NOT DETECTED Final    Comment: Performed at [facility], [address], [place], [place] [address]  Culture, blood (routine x 2)     Status: None (Preliminary result)   Collection Time: 05/28/21 12:57 AM   Specimen: BLOOD RIGHT HAND  Result Value Ref Range Status   Specimen Description BLOOD RIGHT HAND  Final   Special Requests   Final    BOTTLES DRAWN AEROBIC AND ANAEROBIC Blood Culture adequate volume   Culture   Final    NO GROWTH 3 DAYS Performed at [facility], [address], [place], [place] [address]    Report Status PENDING  Incomplete  Culture, blood (routine x 2)     Status: None (Preliminary result)   Collection Time: 05/28/21 12:57 AM   Specimen: BLOOD  Result Value Ref Range Status   Specimen Description BLOOD RIGHT ANTECUBITAL  Final   Special Requests   Final    BOTTLES DRAWN AEROBIC AND ANAEROBIC Blood Culture adequate volume   Culture   Final    NO GROWTH 3 DAYS Performed at [facility], [address] [place], [place] [address]    Report Status PENDING  Incomplete    RADIOLOGY  STUDIES/RESULTS: No results found.   LOS: 5 days   Jeoffrey Massed, MD  [facility]    To contact the attending provider between 7A-7P or the covering provider during after hours 7P-7A, please log into the web site [email or web address] and access using universal [place] password for that web site. If you do not have the password, please call the hospital operator.  06/01/2021, 10:56 AM

## 2021-06-01 NOTE — Procedures (Signed)
    TRANSESOPHAGEAL ECHOCARDIOGRAM   NAME:  [name]    MRN: [id number] DOB:  05-20-1961    ADMIT DATE: 05/26/2021  INDICATIONS: Bacteremia  PROCEDURE:   Informed consent was obtained prior to the procedure. The risks, benefits and alternatives for the procedure were discussed and the patient comprehended these risks.  Risks include, but are not limited to, cough, sore throat, vomiting, nausea, somnolence, esophageal and stomach trauma or perforation, bleeding, low blood pressure, aspiration, pneumonia, infection, trauma to the teeth and death.    Procedural time out performed. The oropharynx was anesthetized with topical 1% benzocaine.    Anesthesia was administered by Dr. Salvadore Farber. The TEE was obtained during her leg amputation under general anesthesia. The patient's heart rate, blood pressure, and oxygen saturation are monitored continuously during the procedure. The period of conscious sedation is 20 minutes, of which I was present face-to-face 100% of this time.   The transesophageal probe was inserted in the esophagus and stomach without difficulty and multiple views were obtained.   COMPLICATIONS:    There were no immediate complications.  KEY FINDINGS:  No signs of endocarditis. Normal LV/RV function. No significant valvular heart disease.  Full report to follow. Further management per primary team.   Gerri Spore T. Flora Lipps, MD, [facility]  [facility]  [address] [place], [place] [address] [phone]  11:22 AM

## 2021-06-01 NOTE — Anesthesia Preprocedure Evaluation (Addendum)
Anesthesia Evaluation  Patient identified by MRN, date of birth, ID band Patient awake    Reviewed: Allergy & Precautions, NPO status , Patient's Chart, lab work & pertinent test results  Airway Mallampati: III  TM Distance: >3 FB Neck ROM: Full    Dental no notable dental hx. (+) Poor Dentition, Missing, Dental Advisory Given, Chipped,    Pulmonary asthma , former smoker,  Quit smoking 2005, 19 pack year history    Pulmonary exam normal breath sounds clear to auscultation       Cardiovascular hypertension, Pt. on medications + CAD, + Cardiac Stents and +CHF  Normal cardiovascular exam+ Valvular Problems/Murmurs (mild to mod AS) AS  Rhythm:Regular Rate:Normal  Echo 05/2021: 1. An aortic valve gradient was measured. The valve cannot be seen well  enough to make any further comments.  2. Technically difficult echo with poor image quality. Using Definity  contrast, the LV systolic function can be seen to be well preserved.  3. Left ventricular ejection fraction, by estimation, is 55 to 60%. The  left ventricle has normal function. Left ventricular endocardial border  not optimally defined to evaluate regional wall motion. Left ventricular  diastolic function could not be  evaluated.  4. Right ventricular systolic function was not well visualized. The right  ventricular size is not well visualized.  5. The mitral valve was not well visualized. No evidence of mitral valve  regurgitation.  6. The aortic valve was not well visualized. Aortic valve regurgitation  is not visualized. Mild to moderate aortic valve stenosis.   Cath 2019: 1. Severe stenosis ostial RCA. 2. Patent stents mid and distal RCA with minimal restenosis.  3. Mild non-obstructive disease in the LAD and Circumflex 4. Successful PTCA/DES x 1 ostial RCA  Recommendations:  Recommend uninterrupted dual antiplatelet therapy with Aspirin 81mg  daily and Clopidogrel  75mg  daily for a minimum of 6 months (stable ischemic heart disease - Class I recommendation). If she tolerates, DAPT, would continue for one year.    Stress test 2022:  Nuclear stress EF: 44%. Visually, the LV EF appears to be greater than the computer calculated 44% and appears normal .  There was no ST segment deviation noted during stress.  This is a low risk study. There is no evidence of ischemia and no evidence of previous infarction  The study is normal.      Neuro/Psych  Headaches, negative psych ROS   GI/Hepatic Neg liver ROS, hiatal hernia, GERD  Controlled and Medicated,IBS   Endo/Other  diabetes, Well Controlled, Type 2, Oral Hypoglycemic Agents, Insulin DependentHypothyroidism Morbid obesityBMI 60 a1c 7.3  Renal/GU Renal InsufficiencyRenal diseaseCr 1.02  negative genitourinary   Musculoskeletal  (+) Arthritis , Rheumatoid disorders,  Fibromyalgia -, narcotic dependentChronic LBP   Abdominal (+) + obese,   Peds  Hematology negative hematology ROS (+)   Anesthesia Other Findings Chronic pain: fent patch, oxy 10mg   Reproductive/Obstetrics negative OB ROS                            Anesthesia Physical Anesthesia Plan  ASA: 4  Anesthesia Plan: General and Regional   Post-op Pain Management: GA combined w/ Regional for post-op pain   Induction: Intravenous  PONV Risk Score and Plan: 3 and Ondansetron, Dexamethasone, Midazolam and Treatment may vary due to age or medical condition  Airway Management Planned: LMA  Additional Equipment: None  Intra-op Plan:   Post-operative Plan: Extubation in OR  Informed Consent:  I have reviewed the patients History and Physical, chart, labs and discussed the procedure including the risks, benefits and alternatives for the proposed anesthesia with the patient or authorized representative who has indicated his/her understanding and acceptance.     Dental advisory given  Plan Discussed with:  CRNA  Anesthesia Plan Comments:        Anesthesia Quick Evaluation

## 2021-06-01 NOTE — Anesthesia Procedure Notes (Signed)
Anesthesia Regional Block: Popliteal block   Pre-Anesthetic Checklist: , timeout performed,  Correct Patient, Correct Site, Correct Laterality,  Correct Procedure, Correct Position, site marked,  Risks and benefits discussed,  Surgical consent,  Pre-op evaluation,  At surgeon's request and post-op pain management  Laterality: Right  Prep: Maximum Sterile Barrier Precautions used, chloraprep       Needles:  Injection technique: Single-shot  Needle Type: Echogenic Stimulator Needle     Needle Length: 9cm  Needle Gauge: 22     Additional Needles:   Procedures:,,,, ultrasound used (permanent image in chart),,    Narrative:  Start time: 06/01/2021 10:30 AM End time: 06/01/2021 10:35 AM Injection made incrementally with aspirations every 5 mL.  Performed by: Personally  Anesthesiologist: Lannie Fields, DO  Additional Notes: Monitors applied. No increased pain on injection. No increased resistance to injection. Injection made in 5cc increments. Good needle visualization. Patient tolerated procedure well.

## 2021-06-01 NOTE — Transfer of Care (Signed)
Immediate Anesthesia Transfer of Care Note  Patient: [name]  Procedure(s) Performed: RIGHT BELOW KNEE AMPUTATION (Right: Knee) TRANSESOPHAGEAL ECHOCARDIOGRAM (TEE)  Patient Location: PACU  Anesthesia Type:GA combined with regional for post-op pain  Level of Consciousness: awake, alert , oriented and patient cooperative  Airway & Oxygen Therapy: Patient Spontanous Breathing and Patient connected to face mask oxygen  Post-op Assessment: Report given to RN and Post -op Vital signs reviewed and stable  Post vital signs: Reviewed and stable  Last Vitals:  Vitals Value Taken Time  BP 186/75 06/01/21 1145  Temp    Pulse 89 06/01/21 1148  Resp 22 06/01/21 1148  SpO2 98 % 06/01/21 1148  Vitals shown include unvalidated device data.  Last Pain:  Vitals:   06/01/21 0746  TempSrc: Oral  PainSc:       Patients Stated Pain Goal: 5 (05/28/21 2300)  Complications: No notable events documented.

## 2021-06-01 NOTE — Anesthesia Procedure Notes (Signed)
Anesthesia Regional Block: Adductor canal block   Pre-Anesthetic Checklist: , timeout performed,  Correct Patient, Correct Site, Correct Laterality,  Correct Procedure, Correct Position, site marked,  Risks and benefits discussed,  Surgical consent,  Pre-op evaluation,  At surgeon's request and post-op pain management  Laterality: Right  Prep: Maximum Sterile Barrier Precautions used, chloraprep       Needles:  Injection technique: Single-shot  Needle Type: Echogenic Stimulator Needle     Needle Length: 9cm  Needle Gauge: 22     Additional Needles:   Procedures:,,,, ultrasound used (permanent image in chart),,    Narrative:  Start time: 06/01/2021 10:35 AM End time: 06/01/2021 10:40 AM Injection made incrementally with aspirations every 5 mL.  Performed by: Personally  Anesthesiologist: Lannie Fields, DO  Additional Notes: Monitors applied. No increased pain on injection. No increased resistance to injection. Injection made in 5cc increments. Good needle visualization. Patient tolerated procedure well.

## 2021-06-01 NOTE — Interval H&P Note (Signed)
History and Physical Interval Note:  06/01/2021 6:51 AM  [name]  has presented today for surgery, with the diagnosis of Osteomyelitis Right Foot.  The various methods of treatment have been discussed with the patient and family. After consideration of risks, benefits and other options for treatment, the patient has consented to  Procedure(s): RIGHT BELOW KNEE AMPUTATION (Right) TRANSESOPHAGEAL ECHOCARDIOGRAM (TEE) (N/A) as a surgical intervention.  The patient's history has been reviewed, patient examined, no change in status, stable for surgery.  I have reviewed the patient's chart and labs.  Questions were answered to the patient's satisfaction.     [name]

## 2021-06-01 NOTE — Anesthesia Procedure Notes (Signed)
Procedure Name: Intubation Date/Time: 06/01/2021 11:00 AM Performed by: Laruth Bouchard., CRNA Pre-anesthesia Checklist: Patient identified, Emergency Drugs available, Suction available, Patient being monitored and Timeout performed Patient Re-evaluated:Patient Re-evaluated prior to induction Oxygen Delivery Method: Circle system utilized Preoxygenation: Pre-oxygenation with 100% oxygen Induction Type: IV induction and Rapid sequence Laryngoscope Size: Glidescope and 3 Grade View: Grade I Tube type: Oral Tube size: 7.0 mm Number of attempts: 1 Airway Equipment and Method: Rigid stylet and Video-laryngoscopy Placement Confirmation: ETT inserted through vocal cords under direct vision, positive ETCO2 and breath sounds checked- equal and bilateral Secured at: 22 cm Tube secured with: Tape Dental Injury: Teeth and Oropharynx as per pre-operative assessment

## 2021-06-01 NOTE — Progress Notes (Signed)
Nutrition Follow-up  DOCUMENTATION CODES:   Morbid obesity  INTERVENTION:   -Once diet is resumed, continue:  -Magic cup TID with meals, each supplement provides 290 kcal and 9 grams of protein  -Double protein portions with meals -MVI with minerals daily  NUTRITION DIAGNOSIS:   Increased nutrient needs related to wound healing as evidenced by estimated needs.  Ongoing  GOAL:   Patient will meet greater than or equal to 90% of their needs  Progressing   MONITOR:   PO intake, Supplement acceptance, Labs, Weight trends, Skin, I & O's  REASON FOR ASSESSMENT:   Consult Assessment of nutrition requirement/status  ASSESSMENT:   Patient is a 60 y.o. female with history of DM-2, CAD, HTN, MCTD, chronic right foot ulcer (refused BKA in the past as wanted to try hyperbaric therapy)-presenting with fever/RLE erythema-found to have sepsis and Enterococcus bacteremia.  Reviewed I/O's: +1.6 L x 24 hours and +6.4 L since admission   Pt down in OR at time of visit. She is currently NPO for procedure (rt BKA).   Pt remains with good appetite. Noted meal completion 75-100%.   Medications reviewed and include cardizem, zetia, florastor, vitamin D, and 0.9% sodium chloride infusion @ 20 ml/hr.   Labs reviewed: Na: 132, CBGS: 197-269 (inpatient orders for glycemic control are 0-20 units insulin aspart TID with meals, 0-5 units insulin aspart daily at bedtime, 15 units insulin aspart TID with meals, and 42 units insulin glargine-yfgn daily).  Diet Order:   Diet Order             Diet NPO time specified  Diet effective midnight                   EDUCATION NEEDS:   No education needs have been identified at this time  Skin:  Skin Assessment: Skin Integrity Issues: Skin Integrity Issues:: Diabetic Ulcer Diabetic Ulcer: rt foot  Last BM:  05/31/21  Height:   Ht Readings from Last 1 Encounters:  05/27/21 5\' 2"  (1.575 m)    Weight:   Wt Readings from Last 1  Encounters:  05/26/21 (!) 149.7 kg    Ideal Body Weight:  50 kg  BMI:  Body mass index is 60.36 kg/m.  Estimated Nutritional Needs:   Kcal:  1800-2000  Protein:  110-125 grams  Fluid:  > 1.8 L    05/28/21, RD, LDN, CDCES Registered Dietitian II Certified Diabetes Care and Education Specialist Please refer to [facility] for RD and/or RD on-call/weekend/after hours pager

## 2021-06-01 NOTE — Anesthesia Postprocedure Evaluation (Signed)
Anesthesia Post Note  Patient: [name]  Procedure(s) Performed: RIGHT BELOW KNEE AMPUTATION (Right: Knee) TRANSESOPHAGEAL ECHOCARDIOGRAM (TEE)     Patient location during evaluation: PACU Anesthesia Type: Regional and General Level of consciousness: awake and alert, oriented and patient cooperative Pain management: pain level controlled Vital Signs Assessment: post-procedure vital signs reviewed and stable Respiratory status: spontaneous breathing, nonlabored ventilation and respiratory function stable Cardiovascular status: blood pressure returned to baseline and stable Postop Assessment: no apparent nausea or vomiting Anesthetic complications: no   No notable events documented.  Last Vitals:  Vitals:   06/01/21 1200 06/01/21 1215  BP: (!) 168/70 (!) 161/72  Pulse: 87 87  Resp: 18 14  Temp:  36.6 C  SpO2: 95% 98%    Last Pain:  Vitals:   06/01/21 1215  TempSrc:   PainSc: 0-No pain                 Lannie Fields

## 2021-06-01 NOTE — Op Note (Signed)
   Date of Surgery: 06/01/2021  INDICATIONS: Ms. Leven is a 60 y.o.-year-old female who presents with abscess and osteomyelitis with ascending cellulitis right leg.  Due to failure conservative treatment patient presents at this time for right transtibial amputation.Marland Kitchen  PREOPERATIVE DIAGNOSIS: Abscess osteomyelitis and ulceration right foot  POSTOPERATIVE DIAGNOSIS: Same.  PROCEDURE: Transtibial amputation Application of Prevena wound VAC  SURGEON: Lajoyce Corners, M.D.  ANESTHESIA:  general  IV FLUIDS AND URINE: See anesthesia records.  ESTIMATED BLOOD LOSS: See anesthesia records.  COMPLICATIONS: None.  DESCRIPTION OF PROCEDURE: The patient was brought to the operating room after undergoing regional anesthetic. After adequate levels of anesthesia were obtained patient's lower extremity was prepped using DuraPrep draped into a sterile field. A timeout was called. The foot was draped out of the sterile field with impervious stockinette. A transverse incision was made 11 cm distal to the tibial tubercle. This curved proximally and a large posterior flap was created. The tibia was transected 1 cm proximal to the skin incision. The fibula was transected just proximal to the tibial incision. The tibia was beveled anteriorly. A large posterior flap was created. The sciatic nerve was pulled cut and allowed to retract. The vascular bundles were suture ligated with 2-0 silk. The deep and superficial fascial layers were closed using #1 Vicryl. The skin was closed using staples and 2-0 nylon. The wound was covered with a Prevena customizable and arthroform wound VAC.  The dressing was sealed with dermatac there was a good suction fit. A prosthetic shrinker and limb protector were applied. Patient was taken to the PACU in stable condition.   DISCHARGE PLANNING:  Antibiotic duration: 24 hours  Weightbearing: Nonweightbearing on the operative extremity  Pain medication: Opioid pathway  Dressing care/ Wound  VAC: Continue wound VAC for 1 week after discharge  Discharge to: Discharge planning based on therapy's recommendations for possible inpatient rehabilitation, outpatient rehabilitation, or discharge to home with therapy  Follow-up: In the office 1 week post operative.  Aldean Baker, MD [facility] 11:57 AM

## 2021-06-02 ENCOUNTER — Encounter (HOSPITAL_BASED_OUTPATIENT_CLINIC_OR_DEPARTMENT_OTHER): Payer: 59 | Admitting: Internal Medicine

## 2021-06-02 ENCOUNTER — Encounter (HOSPITAL_COMMUNITY): Payer: Self-pay | Admitting: Orthopedic Surgery

## 2021-06-02 DIAGNOSIS — I10 Essential (primary) hypertension: Secondary | ICD-10-CM | POA: Diagnosis not present

## 2021-06-02 DIAGNOSIS — G894 Chronic pain syndrome: Secondary | ICD-10-CM | POA: Diagnosis not present

## 2021-06-02 DIAGNOSIS — B952 Enterococcus as the cause of diseases classified elsewhere: Secondary | ICD-10-CM | POA: Diagnosis not present

## 2021-06-02 DIAGNOSIS — E1169 Type 2 diabetes mellitus with other specified complication: Secondary | ICD-10-CM | POA: Diagnosis not present

## 2021-06-02 DIAGNOSIS — R7881 Bacteremia: Secondary | ICD-10-CM | POA: Diagnosis not present

## 2021-06-02 DIAGNOSIS — A419 Sepsis, unspecified organism: Secondary | ICD-10-CM | POA: Diagnosis not present

## 2021-06-02 DIAGNOSIS — M86271 Subacute osteomyelitis, right ankle and foot: Secondary | ICD-10-CM | POA: Diagnosis not present

## 2021-06-02 LAB — BASIC METABOLIC PANEL
Anion gap: 8 (ref 5–15)
BUN: 11 mg/dL (ref 6–20)
CO2: 30 mmol/L (ref 22–32)
Calcium: 9.1 mg/dL (ref 8.9–10.3)
Chloride: 97 mmol/L — ABNORMAL LOW (ref 98–111)
Creatinine, Ser: 1.07 mg/dL — ABNORMAL HIGH (ref 0.44–1.00)
GFR, Estimated: 60 mL/min — ABNORMAL LOW (ref >60)
Glucose, Bld: 258 mg/dL — ABNORMAL HIGH (ref 70–99)
Potassium: 4.7 mmol/L (ref 3.5–5.1)
Sodium: 135 mmol/L (ref 135–145)

## 2021-06-02 LAB — CBC
HCT: 35.5 % — ABNORMAL LOW (ref 36.0–46.0)
Hemoglobin: 10.8 g/dL — ABNORMAL LOW (ref 12.0–15.0)
MCH: 26.1 pg (ref 26.0–34.0)
MCHC: 30.4 g/dL (ref 30.0–36.0)
MCV: 85.7 fL (ref 80.0–100.0)
Platelets: 225 10*3/uL (ref 150–400)
RBC: 4.14 MIL/uL (ref 3.87–5.11)
RDW: 17 % — ABNORMAL HIGH (ref 11.5–15.5)
WBC: 11.5 10*3/uL — ABNORMAL HIGH (ref 4.0–10.5)
nRBC: 0 % (ref 0.0–0.2)

## 2021-06-02 LAB — CULTURE, BLOOD (ROUTINE X 2)
Culture: NO GROWTH
Culture: NO GROWTH
Special Requests: ADEQUATE
Special Requests: ADEQUATE

## 2021-06-02 LAB — GLUCOSE, CAPILLARY
Glucose-Capillary: 192 mg/dL — ABNORMAL HIGH (ref 70–99)
Glucose-Capillary: 225 mg/dL — ABNORMAL HIGH (ref 70–99)
Glucose-Capillary: 239 mg/dL — ABNORMAL HIGH (ref 70–99)
Glucose-Capillary: 276 mg/dL — ABNORMAL HIGH (ref 70–99)
Glucose-Capillary: 312 mg/dL — ABNORMAL HIGH (ref 70–99)

## 2021-06-02 MED ORDER — OXYCODONE HCL 5 MG PO TABS
10.0000 mg | ORAL_TABLET | Freq: Four times a day (QID) | ORAL | Status: DC | PRN
  Administered 2021-06-02: 10 mg via ORAL
  Filled 2021-06-02: qty 2

## 2021-06-02 MED ORDER — OXYCODONE HCL 5 MG PO TABS
15.0000 mg | ORAL_TABLET | Freq: Four times a day (QID) | ORAL | Status: DC | PRN
Start: 2021-06-02 — End: 2021-06-03
  Administered 2021-06-02 – 2021-06-03 (×3): 15 mg via ORAL
  Filled 2021-06-02 (×4): qty 3

## 2021-06-02 MED ORDER — INSULIN GLARGINE-YFGN 100 UNIT/ML ~~LOC~~ SOLN
50.0000 [IU] | Freq: Every day | SUBCUTANEOUS | Status: DC
  Administered 2021-06-03: 50 [IU] via SUBCUTANEOUS
  Filled 2021-06-02 (×2): qty 0.5

## 2021-06-02 MED ORDER — SODIUM CHLORIDE 0.9 % IV SOLN
2.0000 g | INTRAVENOUS | Status: DC
Start: 2021-06-02 — End: 2021-06-08
  Administered 2021-06-02 – 2021-06-08 (×38): 2 g via INTRAVENOUS
  Filled 2021-06-02 (×44): qty 2000

## 2021-06-02 MED ORDER — INSULIN ASPART 100 UNIT/ML IJ SOLN
20.0000 [IU] | Freq: Three times a day (TID) | INTRAMUSCULAR | Status: DC
  Administered 2021-06-02 – 2021-06-03 (×4): 20 [IU] via SUBCUTANEOUS

## 2021-06-02 MED ORDER — HYDROMORPHONE HCL 1 MG/ML IJ SOLN
1.0000 mg | INTRAMUSCULAR | Status: DC | PRN
  Administered 2021-06-03: 1 mg via INTRAVENOUS
  Filled 2021-06-02: qty 1

## 2021-06-02 MED ORDER — ACETAMINOPHEN 500 MG PO TABS
1000.0000 mg | ORAL_TABLET | Freq: Three times a day (TID) | ORAL | Status: DC
  Administered 2021-06-02 – 2021-06-08 (×20): 1000 mg via ORAL
  Filled 2021-06-02 (×20): qty 2

## 2021-06-02 MED ORDER — HYDROMORPHONE HCL 1 MG/ML IJ SOLN
1.0000 mg | INTRAMUSCULAR | Status: AC | PRN
  Administered 2021-06-02 (×3): 1 mg via INTRAVENOUS
  Filled 2021-06-02 (×3): qty 1

## 2021-06-02 NOTE — Progress Notes (Signed)
[facility]  Date of Admission:  05/26/2021           Reason for visit: Follow up on E faecalis bacteremia  Current antibiotics: Unasyn 9/17- present   Previous antibiotics: Vanc 9/15-9/17 Cefepime 9/15-9/17    ASSESSMENT:    60 y.o. female admitted with:  Enterococcus faecalis bacteremia: Secondary to cellulitis and chronic foot ulcer of the right foot with osteomyelitis.  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 Lajoyce Corners.  TEE at that time was negative for endocarditis. Diabetes: A1c 7.3. History of CAD. 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 History of C. difficile: 5 to 6 years ago per her report and has stated that she does well with Florastor when on antibiotics. Sepsis secondary to #1 with sepsis physiology resolved.  RECOMMENDATIONS:    Will transition to ampicillin now that anaerobic coverage not indicated.  Plan to continue antibiotics for 7 days post OR through 9/28 to treat her bacteremia as OM has been cured with BKA.  Continue IV antibiotics while she is in the hospital and if she discharges to rehab.  If if she were to discharge home, could transition to high-dose amoxicillin 1 g 3 times daily to finish her course. Will sign off.  Please call as needed.   Principal Problem:   Bacteremia due to Enterococcus Active Problems:   Hyperlipidemia   Mixed connective tissue disease (HCC)   Hypothyroidism   Uncomplicated asthma   Benign essential HTN   Chronic pain associated with significant psychosocial dysfunction   Gastroesophageal reflux disease   Diabetes mellitus type 2 in obese (HCC)   Cellulitis of right foot   CAD S/P percutaneous coronary angioplasty   Foot osteomyelitis (HCC)   Sepsis (HCC)    MEDICATIONS:    Scheduled Meds: . acetaminophen  650 mg Oral Q6H  . aspirin EC  81 mg Oral Daily  . diltiazem  240 mg Oral Daily  . enoxaparin (LOVENOX) injection  0.5 mg/kg Subcutaneous Daily  . ezetimibe  10 mg Oral Daily   . feeding supplement (GLUCERNA SHAKE)  237 mL Oral TID BM  . gabapentin  400 mg Oral BID WC  . gabapentin  800 mg Oral QHS  . hydroxychloroquine  200 mg Oral BID  . insulin aspart  0-20 Units Subcutaneous TID WC  . insulin aspart  0-5 Units Subcutaneous QHS  . insulin aspart  15 Units Subcutaneous TID WC  . insulin glargine-yfgn  42 Units Subcutaneous Daily  . irbesartan  150 mg Oral Daily  . isosorbide mononitrate  30 mg Oral Daily  . levothyroxine  200 mcg Oral QAC breakfast  . loratadine  10 mg Oral QHS  . multivitamin with minerals  1 tablet Oral Daily  . mupirocin ointment   Topical TID  . pramipexole  1 mg Oral BID  . saccharomyces boulardii  250 mg Oral BID  . Vitamin D (Ergocalciferol)  50,000 Units Oral Q7 days   Continuous Infusions: . ampicillin-sulbactam (UNASYN) IV 3 g (06/02/21 0344)   PRN Meds:.acetaminophen **OR** acetaminophen, albuterol, cyclobenzaprine, fluticasone, HYDROmorphone (DILAUDID) injection, magnesium oxide, nitroGLYCERIN, oxyCODONE, pantoprazole, phenol, pramipexole, promethazine  SUBJECTIVE:   24 hour events:  Afebrile, T-max 14F WBC 11.5 postoperatively Creatinine stable Glucose 258 No new micro, blood cultures 9/17 remain no growth No new imaging Status post transtibial amputation and application of Prevena wound VAC TEE at time of surgery was negative for valvular vegetations Continues on Unasyn   She had a rough night  but feels better this morning.  She is eager to work with therapy and thinks she would like to go home but realizes that she may need a short rehab stay.  Discussed her TEE results at the time of her BKA.  Review of Systems  All other systems reviewed and are negative.    OBJECTIVE:   Blood pressure (!) 162/77, pulse 100, temperature 98.8 F (37.1 C), temperature source Oral, resp. rate 15, height 5\' 2"  (1.575 m), weight (!) 149.7 kg, SpO2 93 %. Body mass index is 60.36 kg/m.  Physical Exam Constitutional:       General: She is not in acute distress.    Appearance: Normal appearance.  HENT:     Head: Normocephalic and atraumatic.  Pulmonary:     Effort: Pulmonary effort is normal. No respiratory distress.  Musculoskeletal:     Comments: Status post right BKA with wound VAC in place  Skin:    General: Skin is warm and dry.     Findings: No rash.  Neurological:     General: No focal deficit present.     Mental Status: She is alert and oriented to person, place, and time.  Psychiatric:        Mood and Affect: Mood normal.        Behavior: Behavior normal.     Lab Results: Lab Results  Component Value Date   WBC 11.5 (H) 06/02/2021   HGB 10.8 (L) 06/02/2021   HCT 35.5 (L) 06/02/2021   MCV 85.7 06/02/2021   PLT 225 06/02/2021    Lab Results  Component Value Date   NA 135 06/02/2021   K 4.7 06/02/2021   CO2 30 06/02/2021   GLUCOSE 258 (H) 06/02/2021   BUN 11 06/02/2021   CREATININE 1.07 (H) 06/02/2021   CALCIUM 9.1 06/02/2021   GFRNONAA 60 (L) 06/02/2021   GFRAA 87 05/04/2020    Lab Results  Component Value Date   ALT 36 05/28/2021   AST 76 (H) 05/28/2021   ALKPHOS 37 (L) 05/28/2021   BILITOT 1.1 05/28/2021       Component Value Date/Time   CRP 9.8 (H) 01/02/2018 1809       Component Value Date/Time   ESRSEDRATE 37 (H) 01/02/2018 1809     I have reviewed the micro and lab results in Epic.  Imaging: ECHO TEE  Result Date: 06/01/2021    TRANSESOPHOGEAL ECHO REPORT   Patient Name:   [name] Date of Exam: 06/01/2021 Medical Rec #:  06/03/2021      Height:       62.0 in Accession #:    [id number]     Weight:       330.0 lb Date of Birth:  Dec 11, 1960     BSA:          2.365 m Patient Age:    59 years       BP:           134/81 mmHg Patient Gender: F              HR:           72 bpm. Exam Location:  Inpatient Procedure: Transesophageal Echo, Color Doppler and Cardiac Doppler Indications:     Bacteremia  History:         Patient has prior history of Echocardiogram  examinations, most                  recent 05/29/2021. CHF, CAD;  Risk Factors:Dyslipidemia,                  Hypertension and Diabetes.  Sonographer:     Eulah Pont RDCS Referring Phys:  [id number] Corrin Parker Diagnosing Phys: Lennie Odor MD PROCEDURE: After discussion of the risks and benefits of a TEE, an informed consent was obtained from the patient. The transesophogeal probe was passed without difficulty through the esophogus of the patient. Sedation performed by different physician. The patient was monitored while under deep sedation. The patient's vital signs; including heart rate, blood pressure, and oxygen saturation; remained stable throughout the procedure. The patient developed no complications during the procedure. IMPRESSIONS  1. Left ventricular ejection fraction, by estimation, is 60 to 65%. The left ventricle has normal function. The left ventricle has no regional wall motion abnormalities.  2. Right ventricular systolic function is normal. The right ventricular size is normal.  3. No left atrial/left atrial appendage thrombus was detected.  4. The mitral valve is grossly normal. Trivial mitral valve regurgitation. No evidence of mitral stenosis.  5. The aortic valve is tricuspid. Aortic valve regurgitation is not visualized. Mild aortic valve sclerosis is present, with no evidence of aortic valve stenosis.  6. There is mild (Grade II) layered plaque involving the descending aorta and transverse aorta. Conclusion(s)/Recommendation(s): No evidence of vegetation/infective endocarditis on this transesophageal echocardiogram. FINDINGS  Left Ventricle: Left ventricular ejection fraction, by estimation, is 60 to 65%. The left ventricle has normal function. The left ventricle has no regional wall motion abnormalities. The left ventricular internal cavity size was normal in size. Right Ventricle: The right ventricular size is normal. No increase in right ventricular wall thickness. Right ventricular  systolic function is normal. Left Atrium: Left atrial size was normal in size. No left atrial/left atrial appendage thrombus was detected. Right Atrium: Right atrial size was normal in size. Pericardium: Trivial pericardial effusion is present. Mitral Valve: The mitral valve is grossly normal. Trivial mitral valve regurgitation. No evidence of mitral valve stenosis. There is no evidence of mitral valve vegetation. Tricuspid Valve: The tricuspid valve is grossly normal. Tricuspid valve regurgitation is trivial. No evidence of tricuspid stenosis. There is no evidence of tricuspid valve vegetation. Aortic Valve: The aortic valve is tricuspid. Aortic valve regurgitation is not visualized. Mild aortic valve sclerosis is present, with no evidence of aortic valve stenosis. There is no evidence of aortic valve vegetation. Pulmonic Valve: The pulmonic valve was grossly normal. Pulmonic valve regurgitation is not visualized. No evidence of pulmonic stenosis. There is no evidence of pulmonic valve vegetation. Aorta: The aortic root and ascending aorta are structurally normal, with no evidence of dilitation. There is mild (Grade II) layered plaque involving the descending aorta and transverse aorta. Venous: The right upper pulmonary vein, right lower pulmonary vein, left lower pulmonary vein and left upper pulmonary vein are normal. IAS/Shunts: The interatrial septum appears to be lipomatous. No atrial level shunt detected by color flow Doppler.   AORTA Ao Root diam: 2.72 cm Ao Asc diam:  3.20 cm Lennie Odor MD Electronically signed by Lennie Odor MD Signature Date/Time: 06/01/2021/4:43:32 PM    Final      Imaging independently reviewed in Epic.    Vedia Coffer for Infectious Disease [facility] [phone] pager 06/02/2021, 8:53 AM  I spent greater than 35 minutes with the patient including greater than 50% of time in face to face counsel of the patient and in coordination of  their care.

## 2021-06-02 NOTE — Progress Notes (Signed)
Inpatient Rehabilitation Admissions Coordinator   Inpatient rehab consult received. I met briefly with patient at bedside with Nurse. Patient medicated for pain so I will return tomorrow for further discussions of a possible CIR admit. Once I get OT eval and further PT progress, I can then begin insurance Auth with [employer] for possible Cir admit early next week pending their approval. She is in agreement.  Danne Baxter, RN, MSN Rehab Admissions Coordinator [phone] 06/02/2021 3:13 PM

## 2021-06-02 NOTE — Progress Notes (Signed)
Inpatient Rehabilitation Admissions Coordinator   I await postoperative therapy evals to assist with planning dispo options.  Ottie Glazier, RN, MSN Rehab Admissions Coordinator [phone] 06/02/2021 12:00 PM

## 2021-06-02 NOTE — Progress Notes (Addendum)
Physical Therapy Treatment/Reevaluation  Patient Details Name: [name] MRN: [id number] DOB: 20-Nov-1960 Today's Date: 06/02/2021   History of Present Illness Pt is a 60 y.o. female admitted 05/26/21 with fever and RLE erythema; found to have sepsis due to RLE cellulitis/diabetic foot with Enterococcus bacteremia. 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 PMH includes asthma, CHF, CAD, HLD, HTN, DM2, obesity, connective tissue disease, chronic pain, R toe amputations (01/2020).    PT Comments    Pt seen for re-evaluation s/p R BKA on POD #1.  She had increased pain during therapy and was unable to hop or pivot , but she did hop a few steps earlier with nursing to [facility].  Pt very motivated and disappointed that she could not do more with therapy; however, considering POD #1, comorbidities, and  pain pt did well. She was able to stand and participated with exercises with good ROM.  Pt with good rehab potential.  Updated goals to reflect BKA.    Recommendations for follow up therapy are one component of a multi-disciplinary discharge planning process, led by the attending physician.  Recommendations may be updated based on patient status, additional functional criteria and insurance authorization.  Follow Up Recommendations  CIR     Equipment Recommendations  Other (comment) (potential drop-arm commode with sliding board)    Recommendations for Other Services       Precautions / Restrictions Precautions Precautions: Fall Required Braces or Orthoses: Other Brace Other Brace: limb protector Restrictions RLE Weight Bearing: Non weight bearing     Mobility  Bed Mobility Overal bed mobility: Needs Assistance Bed Mobility: Sit to Supine       Sit to supine: Supervision   General bed mobility comments: EOB at arrival; pt able to return to bed and scoot up in bed using bed rails and trendlenberg position    Transfers Overall transfer level: Needs assistance Equipment used: Rolling walker (2  wheeled) Transfers: Sit to/from Stand Sit to Stand: Min assist         General transfer comment: Bed elevated , use of momentum, increased time to rise  Ambulation/Gait             General Gait Details: unable to hop today; too painful   Stairs             Wheelchair Mobility    Modified Rankin (Stroke Patients Only)       Balance Overall balance assessment: Needs assistance Sitting-balance support: No upper extremity supported Sitting balance-[name]: Good     Standing balance support: Bilateral upper extremity supported Standing balance-[name]: Fair Standing balance comment: Using RW but able to balance on 1 leg in static stand                            Cognition Arousal/Alertness: Awake/alert;Lethargic;Suspect due to medications Behavior During Therapy: [name] for tasks assessed/performed Overall Cognitive Status: Within Functional Limits for tasks assessed                                 General Comments: Pt initially awake but received dilaudid when returned to bed and lethargic. Pt was tearful and disappointed that she didn't feel like doing more with therapy today      Exercises Amputee Exercises Quad Sets: AROM;Both;10 reps;Supine Hip ABduction/ADduction: AROM;Right;10 reps;Supine Knee Flexion: AROM;Right;10 reps;Seated Knee Extension: AROM;Right;10 reps;Seated Straight Leg Raises: AAROM;Right;10 reps;Supine Other  Exercises Other Exercises: Cues for all exercises; educated on importance of full knee extension    General Comments        Pertinent Vitals/Pain Pain Assessment: 0-10 Pain Score: 8  Pain Location: R leg Pain Descriptors / Indicators: Burning;Throbbing Pain Intervention(s): Limited activity within patient's tolerance;Monitored during session;Repositioned;RN gave pain meds during session    Home Living                      Prior Function            PT Goals (current goals can  now be found in the care plan section) Acute Rehab PT Goals Patient Stated Goal: Go to rehab and get stronger PT Goal Formulation: With patient/family Time For Goal Achievement: 06/16/21 Potential to Achieve Goals: Good Progress towards PT goals:  (Goals adjusted s/p R BKA)    Frequency    Min 4X/week      PT Plan Frequency needs to be updated    Co-evaluation              AM-PAC PT "6 Clicks" Mobility   Outcome Measure  Help needed turning from your back to your side while in a flat bed without using bedrails?: A Little Help needed moving from lying on your back to sitting on the side of a flat bed without using bedrails?: A Little Help needed moving to and from a bed to a chair (including a wheelchair)?: A Lot Help needed standing up from a chair using your arms (e.g., wheelchair or bedside chair)?: A Little Help needed to walk in hospital room?: A Lot Help needed climbing 3-5 steps with a railing? : Total 6 Click Score: 14    End of Session Equipment Utilized During Treatment: Gait belt Activity Tolerance: Patient tolerated treatment well Patient left: in bed;with call bell/phone within reach;with bed alarm set;with family/visitor present Nurse Communication: Mobility status PT Visit Diagnosis: Other abnormalities of gait and mobility (R26.89);Muscle weakness (generalized) (M62.81);Pain     Time: 7341-9379 PT Time Calculation (min) (ACUTE ONLY): 27 min  Charges:  $Therapeutic Exercise: 8-22 mins                     Anise Salvo, PT Acute Rehab Services Pager [phone] [facility] [phone]    Rayetta Humphrey 06/02/2021, 1:36 PM

## 2021-06-02 NOTE — Progress Notes (Signed)
Patient ID: [name], female   DOB: [birth year], 60 y.o.   MRN: [id number] Patient is postoperative day 1 transtibial amputation on the right.  Patient states she had increased pain last night.  Patient has been on high doses of narcotics in the past including fentanyl patches and oxycodone 10 mg 3 times a day for routine maintenance.  She has been on narcotics for 11 years and does not want to become addicted.  Pain control is going to be difficult.  Anticipate discharge to skilled nursing when a bed is available.

## 2021-06-02 NOTE — Progress Notes (Addendum)
PROGRESS NOTE        PATIENT DETAILS Name: [name] Age: 60 y.o. Sex: female Date of Birth: April 20, 1961 Admit Date: 05/26/2021 Admitting Physician Eduard Clos, MD HBZ:JIRCVELF, Jilda Roche, DO  Brief Narrative: Patient is a 60 y.o. female with history of DM-2, CAD, HTN, MCTD, chronic right foot ulcer (refused BKA in the past as wanted to try hyperbaric therapy)-presenting with fever/RLE erythema-found to have sepsis and Enterococcus bacteremia.  Subjective: Significant amount of pain overnight.  No SOB/chest pain.  Objective: Vitals: Blood pressure (!) 108/46, pulse 96, temperature 98.9 F (37.2 C), temperature source Oral, resp. rate 16, height 5\' 2"  (1.575 m), weight (!) 149.7 kg, SpO2 95 %.   Exam: Gen Exam:Alert awake-not in any distress HEENT:atraumatic, normocephalic Chest: B/L clear to auscultation anteriorly CVS:S1S2 regular Abdomen:soft non tender, non distended Extremities:no edema-right BKA Neurology: Non focal Skin: no rash   Pertinent Labs/Radiology: NA:: 4.7  K: 4.5 Creatinine: 1.07 WBC: 11.5 Hemoglobin: 10.8  Microbiology: 9/15>>Blood culture: 1/2: Enterococcus faecalis 9/17>> blood culture: No growth  Radiology/Echo: 9/15>> CT right foot: Chronic osteomyelitis at third metatarsal fracture site-soft tissue swelling consistent with cellulitis. 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  Intraoperative TEE on 9/21 without any vegetations.  Antibiotics narrowed to ampicillin-stop date 9/28.   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  Postoperative care deferred to orthopedics.    HTN: BP stable-continue Imdur,  Avapro and Cardizem.  Follow and adjust.    Chronic pain syndrome: At baseline-continue as needed oxycodone and Neurontin  CAD-s/p RCA PCI on 2019: No anginal symptoms-continue ASA  DM-2 (A1c 7.3 on 9/16): CBGs remain on the higher side-on [place] R 500 sliding scale as outpatient-increase Lantus to 50 units daily-increase Premeal NovoLog to 20 units-continue SSI.  Follow and adjust.    Recent Labs    06/01/21 2047 06/02/21 0746 06/02/21 1156  GLUCAP 283* 276* 312*    HLD: Continue Zetia  MCTD: Continue Plaquenil  Hypothyroidism: Continue Synthroid  GERD: Continue PPI  Restless leg syndrome: Continue Mirapex  Bronchial asthma: Not in exacerbation-continue bronchodilators  Morbid Obesity: Estimated body mass index is 60.36 kg/m as calculated from the following:   Height as of this encounter: 5\' 2"  (1.575 m).   Weight as of this encounter: 149.7 kg.    Procedures :None Consults: DVT Prophylaxis: Lovenox Code Status:Full code  Family Communication: Spouse at bedside on 9/21  Time spent: 25 minutes-Greater than 50% of this time was spent in counseling, explanation of diagnosis, planning of further management, and coordination of care.  Diet: Diet Order             Diet heart healthy/carb modified Room service appropriate? Yes; Fluid consistency: Thin  Diet effective now                      Disposition Plan: Status is: Inpatient  Remains inpatient appropriate because:Inpatient level of care appropriate due to severity of illness  Dispo: The patient is from: Home              Anticipated d/c is to: Home  Patient currently is not medically stable to d/c.   Difficult to place patient No    Barriers to Discharge: Enterococcal bacteremia-s/p BKA on 9/21-await PT/OT to determine safe disposition.    Antimicrobial agents: Anti-infectives (From admission, onward)    Start     Dose/Rate Route Frequency Ordered Stop   06/02/21 1030  ampicillin  (OMNIPEN) 2 g in sodium chloride 0.9 % 100 mL IVPB        2 g 300 mL/hr over 20 Minutes Intravenous Every 4 hours 06/02/21 0920     05/28/21 1630  Ampicillin-Sulbactam (UNASYN) 3 g in sodium chloride 0.9 % 100 mL IVPB  Status:  Discontinued        3 g 200 mL/hr over 30 Minutes Intravenous Every 6 hours 05/28/21 0946 06/02/21 0920   05/27/21 2200  vancomycin (VANCOREADY) IVPB 1750 mg/350 mL  Status:  Discontinued        1,750 mg 175 mL/hr over 120 Minutes Intravenous Every 24 hours 05/27/21 0759 05/28/21 0849   05/27/21 2200  hydroxychloroquine (PLAQUENIL) tablet 200 mg        200 mg Oral 2 times daily 05/27/21 2004     05/27/21 1800  vancomycin (VANCOREADY) IVPB 1500 mg/300 mL  Status:  Discontinued        1,500 mg 150 mL/hr over 120 Minutes Intravenous Every 24 hours 05/26/21 1812 05/27/21 0757   05/27/21 0800  ceFEPIme (MAXIPIME) 2 g in sodium chloride 0.9 % 100 mL IVPB  Status:  Discontinued        2 g 200 mL/hr over 30 Minutes Intravenous Every 8 hours 05/27/21 0749 05/28/21 0849   05/27/21 0800  vancomycin (VANCOCIN) IVPB 1000 mg/200 mL premix        1,000 mg 200 mL/hr over 60 Minutes Intravenous  Once 05/27/21 0757 05/27/21 0948   05/26/21 2200  ceFEPIme (MAXIPIME) 2 g in sodium chloride 0.9 % 100 mL IVPB  Status:  Discontinued        2 g 200 mL/hr over 30 Minutes Intravenous Every 8 hours 05/26/21 1812 05/27/21 0749   05/26/21 1815  vancomycin (VANCOCIN) IVPB 1000 mg/200 mL premix        1,000 mg 200 mL/hr over 60 Minutes Intravenous Every hour 05/26/21 1812 05/26/21 2014   05/26/21 1745  vancomycin (VANCOCIN) 2,500 mg in sodium chloride 0.9 % 500 mL IVPB  Status:  Discontinued        2,500 mg 250 mL/hr over 120 Minutes Intravenous  Once 05/26/21 1741 05/26/21 1812        MEDICATIONS: Scheduled Meds:  acetaminophen  1,000 mg Oral Q8H   aspirin EC  81 mg Oral Daily   diltiazem  240 mg Oral Daily   enoxaparin (LOVENOX) injection  0.5 mg/kg Subcutaneous Daily   ezetimibe   10 mg Oral Daily   feeding supplement (GLUCERNA SHAKE)  237 mL Oral TID BM   gabapentin  400 mg Oral BID WC   gabapentin  800 mg Oral QHS   hydroxychloroquine  200 mg Oral BID   insulin aspart  0-20 Units Subcutaneous TID WC   insulin aspart  0-5 Units Subcutaneous QHS   insulin aspart  20 Units Subcutaneous TID WC   [START ON 06/03/2021] insulin glargine-yfgn  50 Units Subcutaneous Daily   irbesartan  150 mg Oral Daily   isosorbide mononitrate  30 mg Oral Daily   levothyroxine  200 mcg Oral QAC breakfast   loratadine  10 mg Oral QHS   multivitamin with  minerals  1 tablet Oral Daily   mupirocin ointment   Topical TID   pramipexole  1 mg Oral BID   saccharomyces boulardii  250 mg Oral BID   Vitamin D (Ergocalciferol)  50,000 Units Oral Q7 days   Continuous Infusions:  ampicillin (OMNIPEN) IV 2 g (06/02/21 1118)   PRN Meds:.albuterol, cyclobenzaprine, fluticasone, magnesium oxide, nitroGLYCERIN, oxyCODONE, pantoprazole, phenol, pramipexole, promethazine   I have personally reviewed following labs and imaging studies  LABORATORY DATA: CBC: Recent Labs  Lab 05/26/21 1700 05/27/21 0645 05/28/21 0057 05/29/21 0052 06/01/21 0225 06/02/21 0150  WBC 20.9* 11.1* 7.1 6.4 7.5 11.5*  NEUTROABS 19.0*  --   --   --   --   --   HGB 12.6 11.4* 10.4* 10.3* 11.7* 10.8*  HCT 40.7 36.6 35.2* 34.8* 37.6 35.5*  MCV 82.4 82.2 85.0 85.3 84.7 85.7  PLT 278 230 231 229 241 225    Basic Metabolic Panel: Recent Labs  Lab 05/27/21 0645 05/28/21 0057 05/29/21 0052 06/01/21 0225 06/02/21 0150  NA 136 135 136 132* 135  K 3.7 3.7 3.7 4.5 4.7  CL 101 97* 98 96* 97*  CO2 GLUCOSE 232* 236* 279* 306* 258*  BUN CREATININE 0.90 1.00 1.15* 1.02* 1.07*  CALCIUM 8.7* 8.8* 8.6* 9.1 9.1    GFR: Estimated Creatinine Clearance: 80.3 mL/min (A) (by C-G formula based on SCr of 1.07 mg/dL (H)).  Liver Function Tests: Recent Labs  Lab 05/26/21 1700 05/28/21 0057  AST  42* 76*  ALT 33 36  ALKPHOS 48 37*  BILITOT 0.7 1.1  PROT 7.8 6.1*  ALBUMIN 3.9 2.9*   No results for input(s): LIPASE, AMYLASE in the last 168 hours. No results for input(s): AMMONIA in the last 168 hours.  Coagulation Profile: Recent Labs  Lab 05/26/21 1745  INR 1.1    Cardiac Enzymes: No results for input(s): CKTOTAL, CKMB, CKMBINDEX, TROPONINI in the last 168 hours.  BNP (last 3 results) No results for input(s): PROBNP in the last 8760 hours.  Lipid Profile: No results for input(s): CHOL, HDL, LDLCALC, TRIG, CHOLHDL, LDLDIRECT in the last 72 hours.  Thyroid Function Tests: No results for input(s): TSH, T4TOTAL, FREET4, T3FREE, THYROIDAB in the last 72 hours.  Anemia Panel: No results for input(s): VITAMINB12, FOLATE, FERRITIN, TIBC, IRON, RETICCTPCT in the last 72 hours.  Urine analysis:    Component Value Date/Time   COLORURINE YELLOW 02/03/2020 1630   APPEARANCEUR CLEAR 02/03/2020 1630   LABSPEC 1.008 02/03/2020 1630   PHURINE 5.0 02/03/2020 1630   GLUCOSEU NEGATIVE 02/03/2020 1630   HGBUR NEGATIVE 02/03/2020 1630   BILIRUBINUR NEGATIVE 02/03/2020 1630   KETONESUR NEGATIVE 02/03/2020 1630   PROTEINUR NEGATIVE 02/03/2020 1630   UROBILINOGEN 0.2 02/10/2015 1835   NITRITE NEGATIVE 02/03/2020 1630   LEUKOCYTESUR NEGATIVE 02/03/2020 1630    Sepsis Labs: Lactic Acid, Venous    Component Value Date/Time   LATICACIDVEN 2.1 (HH) 05/27/2021 0643    MICROBIOLOGY: Recent Results (from the past 240 hour(s))  Blood Culture (routine x 2)     Status: Abnormal   Collection Time: 05/26/21  5:00 PM   Specimen: Left Antecubital; Blood  Result Value Ref Range Status   Specimen Description   Final    LEFT ANTECUBITAL Performed at [facility], [address] [place], [place] [address]    Special Requests   Final    BOTTLES DRAWN AEROBIC AND ANAEROBIC Blood  Culture adequate volume Performed at [facility], [address] [place], [place]  [address]    Culture  Setup Time   Final    GRAM POSITIVE COCCI IN CHAINS AEROBIC BOTTLE ONLY CRITICAL RESULT CALLED TO, READ BACK BY AND VERIFIED WITHCeledonio Miyamoto [facility] 5784 05/27/21 A BROWNING Performed at [facility], [address] [place], [place] [address]    Culture ENTEROCOCCUS FAECALIS (A)  Final   Report Status 05/29/2021 FINAL  Final   Organism ID, Bacteria ENTEROCOCCUS FAECALIS  Final      Susceptibility   Enterococcus faecalis - MIC*    AMPICILLIN <=2 SENSITIVE Sensitive     VANCOMYCIN 1 SENSITIVE Sensitive     GENTAMICIN SYNERGY SENSITIVE Sensitive     * ENTEROCOCCUS FAECALIS  Blood Culture (routine x 2)     Status: None   Collection Time: 05/26/21  5:00 PM   Specimen: Left Antecubital; Blood  Result Value Ref Range Status   Specimen Description   Final    LEFT ANTECUBITAL Performed at [facility], [address] [place], [place] [address]    Special Requests   Final    BOTTLES DRAWN AEROBIC AND ANAEROBIC Blood Culture adequate volume Performed at [facility], [address] [place], [place] [address]    Culture   Final    NO GROWTH 5 DAYS Performed at [facility], [address] [place], [place] [address]    Report Status 05/31/2021 FINAL  Final  Resp Panel by RT-PCR (Flu A&B, Covid) Nasopharyngeal Swab     Status: None   Collection Time: 05/26/21  5:00 PM   Specimen: Nasopharyngeal Swab; Nasopharyngeal(NP) swabs in vial transport medium  Result Value Ref Range Status   SARS Coronavirus 2 by RT PCR NEGATIVE NEGATIVE Final    Comment: (NOTE) SARS-CoV-2 target nucleic acids are NOT DETECTED.  The SARS-CoV-2 RNA is generally detectable in upper respiratory specimens during the acute phase of infection. The lowest concentration of SARS-CoV-2 viral copies this assay can detect is 138 copies/mL. A negative result does not preclude SARS-Cov-2 infection and should not be used as the sole basis for treatment or other patient  management decisions. A negative result may occur with  improper specimen collection/handling, submission of specimen other than nasopharyngeal swab, presence of viral mutation(s) within the areas targeted by this assay, and inadequate number of viral copies(<138 copies/mL). A negative result must be combined with clinical observations, patient history, and epidemiological information. The expected result is Negative.  Fact Sheet for Patients:  BloggerCourse.com  Fact Sheet for Healthcare Providers:  SeriousBroker.it  This test is no t yet approved or cleared by the [place] FDA and  has been authorized for detection and/or diagnosis of SARS-CoV-2 by FDA under an Emergency Use Authorization (EUA). This EUA will remain  in effect (meaning this test can be used) for the duration of the COVID-19 declaration under Section 564(b)(1) of the Act, 21 U.S.C.section 360bbb-3(b)(1), unless the authorization is terminated  or revoked sooner.       Influenza A by PCR NEGATIVE NEGATIVE Final   Influenza B by PCR NEGATIVE NEGATIVE Final    Comment: (NOTE) The Xpert Xpress SARS-CoV-2/FLU/RSV plus assay is intended as an aid in the diagnosis of influenza from Nasopharyngeal swab specimens and should not be used as a sole basis for treatment. Nasal washings and aspirates are unacceptable for Xpert Xpress SARS-CoV-2/FLU/RSV testing.  Fact Sheet for Patients:  BloggerCourse.com  Fact Sheet for Healthcare Providers: SeriousBroker.it  This test is not yet approved or cleared by the [place] FDA and has been authorized for detection and/or diagnosis of SARS-CoV-2 by FDA under an Emergency Use Authorization (EUA). This EUA will remain in effect (meaning this test can be used) for the duration of the COVID-19 declaration under Section 564(b)(1) of the Act, 21 U.S.C. section 360bbb-3(b)(1),  unless the authorization is terminated or revoked.  Performed at [facility], [address] [place], [place] [address]   Blood Culture ID Panel (Reflexed)     Status: Abnormal   Collection Time: 05/26/21  5:00 PM  Result Value Ref Range Status   Enterococcus faecalis DETECTED (A) NOT DETECTED Final    Comment: CRITICAL RESULT CALLED TO, READ BACK BY AND VERIFIED WITH: Celedonio Miyamoto PHARMD 1624 05/27/21 A BROWNING    Enterococcus Faecium NOT DETECTED NOT DETECTED Final   Listeria monocytogenes NOT DETECTED NOT DETECTED Final   Staphylococcus species NOT DETECTED NOT DETECTED Final   Staphylococcus aureus (BCID) NOT DETECTED NOT DETECTED Final   Staphylococcus epidermidis NOT DETECTED NOT DETECTED Final   Staphylococcus lugdunensis NOT DETECTED NOT DETECTED Final   Streptococcus species NOT DETECTED NOT DETECTED Final   Streptococcus agalactiae NOT DETECTED NOT DETECTED Final   Streptococcus pneumoniae NOT DETECTED NOT DETECTED Final   Streptococcus pyogenes NOT DETECTED NOT DETECTED Final   A.calcoaceticus-baumannii NOT DETECTED NOT DETECTED Final   Bacteroides fragilis NOT DETECTED NOT DETECTED Final   Enterobacterales NOT DETECTED NOT DETECTED Final   Enterobacter cloacae complex NOT DETECTED NOT DETECTED Final   Escherichia coli NOT DETECTED NOT DETECTED Final   Klebsiella aerogenes NOT DETECTED NOT DETECTED Final   Klebsiella oxytoca NOT DETECTED NOT DETECTED Final   Klebsiella pneumoniae NOT DETECTED NOT DETECTED Final   Proteus species NOT DETECTED NOT DETECTED Final   Salmonella species NOT DETECTED NOT DETECTED Final   Serratia marcescens NOT DETECTED NOT DETECTED Final   Haemophilus influenzae NOT DETECTED NOT DETECTED Final   Neisseria meningitidis NOT DETECTED NOT DETECTED Final   Pseudomonas aeruginosa NOT DETECTED NOT DETECTED Final   Stenotrophomonas maltophilia NOT DETECTED NOT DETECTED Final   Candida albicans NOT DETECTED NOT DETECTED Final   Candida  auris NOT DETECTED NOT DETECTED Final   Candida glabrata NOT DETECTED NOT DETECTED Final   Candida krusei NOT DETECTED NOT DETECTED Final   Candida parapsilosis NOT DETECTED NOT DETECTED Final   Candida tropicalis NOT DETECTED NOT DETECTED Final   Cryptococcus neoformans/gattii NOT DETECTED NOT DETECTED Final   Vancomycin resistance NOT DETECTED NOT DETECTED Final    Comment: Performed at [facility], [address] [place], [place] [address]  Culture, blood (routine x 2)     Status: None   Collection Time: 05/28/21 12:57 AM   Specimen: BLOOD RIGHT HAND  Result Value Ref Range Status   Specimen Description BLOOD RIGHT HAND  Final   Special Requests   Final    BOTTLES DRAWN AEROBIC AND ANAEROBIC Blood Culture adequate volume   Culture   Final    NO GROWTH 5 DAYS Performed at [facility], [address] [place], [place] [address]    Report Status 06/02/2021 FINAL  Final  Culture, blood (routine x 2)     Status: None   Collection Time: 05/28/21 12:57 AM   Specimen: BLOOD  Result Value Ref Range Status   Specimen Description BLOOD RIGHT ANTECUBITAL  Final   Special  Requests   Final    BOTTLES DRAWN AEROBIC AND ANAEROBIC Blood Culture adequate volume   Culture   Final    NO GROWTH 5 DAYS Performed at [facility], [address] [place], [place] [address]    Report Status 06/02/2021 FINAL  Final    RADIOLOGY STUDIES/RESULTS: ECHO TEE  Result Date: 06/01/2021    TRANSESOPHOGEAL ECHO REPORT   Patient Name:   [name] Date of Exam: 06/01/2021 Medical Rec #:  [id number]      Height:       62.0 in Accession #:    [id number]     Weight:       330.0 lb Date of Birth:  1960-10-08     BSA:          2.365 m Patient Age:    59 years       BP:           134/81 mmHg Patient Gender: F              HR:           72 bpm. Exam Location:  Inpatient Procedure: Transesophageal Echo, Color Doppler and Cardiac Doppler Indications:     Bacteremia  History:         Patient has  prior history of Echocardiogram examinations, most                  recent 05/29/2021. CHF, CAD; Risk Factors:Dyslipidemia,                  Hypertension and Diabetes.  Sonographer:     Eulah Pont RDCS Referring Phys:  [id number] Corrin Parker Diagnosing Phys: Lennie Odor MD PROCEDURE: After discussion of the risks and benefits of a TEE, an informed consent was obtained from the patient. The transesophogeal probe was passed without difficulty through the esophogus of the patient. Sedation performed by different physician. The patient was monitored while under deep sedation. The patient's vital signs; including heart rate, blood pressure, and oxygen saturation; remained stable throughout the procedure. The patient developed no complications during the procedure. IMPRESSIONS  1. Left ventricular ejection fraction, by estimation, is 60 to 65%. The left ventricle has normal function. The left ventricle has no regional wall motion abnormalities.  2. Right ventricular systolic function is normal. The right ventricular size is normal.  3. No left atrial/left atrial appendage thrombus was detected.  4. The mitral valve is grossly normal. Trivial mitral valve regurgitation. No evidence of mitral stenosis.  5. The aortic valve is tricuspid. Aortic valve regurgitation is not visualized. Mild aortic valve sclerosis is present, with no evidence of aortic valve stenosis.  6. There is mild (Grade II) layered plaque involving the descending aorta and transverse aorta. Conclusion(s)/Recommendation(s): No evidence of vegetation/infective endocarditis on this transesophageal echocardiogram. FINDINGS  Left Ventricle: Left ventricular ejection fraction, by estimation, is 60 to 65%. The left ventricle has normal function. The left ventricle has no regional wall motion abnormalities. The left ventricular internal cavity size was normal in size. Right Ventricle: The right ventricular size is normal. No increase in right ventricular  wall thickness. Right ventricular systolic function is normal. Left Atrium: Left atrial size was normal in size. No left atrial/left atrial appendage thrombus was detected. Right Atrium: Right atrial size was normal in size. Pericardium: Trivial pericardial effusion is present. Mitral Valve: The mitral valve is grossly normal. Trivial mitral valve regurgitation. No evidence of mitral valve stenosis. There is  no evidence of mitral valve vegetation. Tricuspid Valve: The tricuspid valve is grossly normal. Tricuspid valve regurgitation is trivial. No evidence of tricuspid stenosis. There is no evidence of tricuspid valve vegetation. Aortic Valve: The aortic valve is tricuspid. Aortic valve regurgitation is not visualized. Mild aortic valve sclerosis is present, with no evidence of aortic valve stenosis. There is no evidence of aortic valve vegetation. Pulmonic Valve: The pulmonic valve was grossly normal. Pulmonic valve regurgitation is not visualized. No evidence of pulmonic stenosis. There is no evidence of pulmonic valve vegetation. Aorta: The aortic root and ascending aorta are structurally normal, with no evidence of dilitation. There is mild (Grade II) layered plaque involving the descending aorta and transverse aorta. Venous: The right upper pulmonary vein, right lower pulmonary vein, left lower pulmonary vein and left upper pulmonary vein are normal. IAS/Shunts: The interatrial septum appears to be lipomatous. No atrial level shunt detected by color flow Doppler.   AORTA Ao Root diam: 2.72 cm Ao Asc diam:  3.20 cm Lennie Odor MD Electronically signed by Lennie Odor MD Signature Date/Time: 06/01/2021/4:43:32 PM    Final      LOS: 6 days   Jeoffrey Massed, MD  [facility]    To contact the attending provider between 7A-7P or the covering provider during after hours 7P-7A, please log into the web site [email or web address] and access using universal [place] password for that web site. If you do not  have the password, please call the hospital operator.  06/02/2021, 12:06 PM

## 2021-06-03 ENCOUNTER — Encounter (HOSPITAL_BASED_OUTPATIENT_CLINIC_OR_DEPARTMENT_OTHER): Payer: 59 | Admitting: Internal Medicine

## 2021-06-03 DIAGNOSIS — R7881 Bacteremia: Secondary | ICD-10-CM | POA: Diagnosis not present

## 2021-06-03 DIAGNOSIS — G894 Chronic pain syndrome: Secondary | ICD-10-CM | POA: Diagnosis not present

## 2021-06-03 DIAGNOSIS — I10 Essential (primary) hypertension: Secondary | ICD-10-CM | POA: Diagnosis not present

## 2021-06-03 DIAGNOSIS — E1169 Type 2 diabetes mellitus with other specified complication: Secondary | ICD-10-CM | POA: Diagnosis not present

## 2021-06-03 LAB — SURGICAL PATHOLOGY

## 2021-06-03 LAB — GLUCOSE, CAPILLARY
Glucose-Capillary: 217 mg/dL — ABNORMAL HIGH (ref 70–99)
Glucose-Capillary: 232 mg/dL — ABNORMAL HIGH (ref 70–99)
Glucose-Capillary: 220 mg/dL — ABNORMAL HIGH (ref 70–99)
Glucose-Capillary: 209 mg/dL — ABNORMAL HIGH (ref 70–99)

## 2021-06-03 MED ORDER — BISACODYL 10 MG RE SUPP: 10.0000 mg | Freq: Every day | RECTAL | Status: DC | PRN

## 2021-06-03 MED ORDER — HYDROMORPHONE HCL 1 MG/ML IJ SOLN
1.0000 mg | INTRAMUSCULAR | Status: DC | PRN
  Administered 2021-06-03 – 2021-06-07 (×15): 1 mg via INTRAVENOUS
  Filled 2021-06-03 (×15): qty 1

## 2021-06-03 MED ORDER — OXYCODONE HCL 5 MG PO TABS
20.0000 mg | ORAL_TABLET | Freq: Four times a day (QID) | ORAL | Status: DC | PRN
  Administered 2021-06-03 – 2021-06-08 (×13): 20 mg via ORAL
  Filled 2021-06-03 (×14): qty 4

## 2021-06-03 MED ORDER — SENNA 8.6 MG PO TABS: 2.0000 | ORAL_TABLET | Freq: Every day | ORAL | Status: DC | PRN

## 2021-06-03 MED ORDER — POLYETHYLENE GLYCOL 3350 17 G PO PACK
17.0000 g | PACK | Freq: Two times a day (BID) | ORAL | Status: DC
  Administered 2021-06-03 – 2021-06-06 (×4): 17 g via ORAL
  Filled 2021-06-03 (×9): qty 1

## 2021-06-03 NOTE — Progress Notes (Signed)
Physical Therapy Treatment Patient Details Name: [name] MRN: [id number] DOB: [birth year] Today's Date: 06/03/2021   History of Present Illness Pt is a 60 y.o. female admitted 05/26/21 with fever and RLE erythema; found to have sepsis due to RLE cellulitis/diabetic foot with Enterococcus bacteremia. 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 PMH includes asthma, CHF, CAD, HLD, HTN, DM2, obesity, connective tissue disease, chronic pain, R toe amputations (01/2020).    PT Comments    Pt received in bed with c/o severe pain. Agreeable to participation in therapy, in hopes that change of position will help with pain. Pt performed LE exercises in bed prior to mobility. She required mod assist supine to sit, and +2 mod assist lateral slide transfer bed to drop-arm [facility] with slide board. Attempted use of stedy but not a good fit due to pt's body habitus. Pt remained on BSC at end of session. Husband present in room. RN aware.   Recommendations for follow up therapy are one component of a multi-disciplinary discharge planning process, led by the attending physician.  Recommendations may be updated based on patient status, additional functional criteria and insurance authorization.  Follow Up Recommendations  CIR     Equipment Recommendations  Other (comment) (drop-arm BSC, slide board)    Recommendations for Other Services       Precautions / Restrictions Precautions Precautions: Fall;Other (comment) Precaution Comments: body habitus Required Braces or Orthoses: Other Brace Other Brace: limb protector Restrictions RLE Weight Bearing: Non weight bearing     Mobility  Bed Mobility Overal bed mobility: Needs Assistance Bed Mobility: Supine to Sit     Supine to sit: Mod assist;HOB elevated     General bed mobility comments: +rail, increased time, assist to elevate trunk and scoot to EOB    Transfers Overall transfer level: Needs assistance Equipment used: Sliding board Transfers:  Lateral/Scoot Transfers          Lateral/Scoot Transfers: +2 safety/equipment;Mod assist General transfer comment: attempted sit to stand in stedy x 2 trials but pt too wide to fit hips inside frame. She reports she feels too weak today to attempt sit to stand with RW. Slide board utilized for bed to [facility] transfer toward R. Husband supporting slide board while therapist blocked L knee and assisted with scooting. Increased time required.  Ambulation/Gait                 Stairs             Wheelchair Mobility    Modified Rankin (Stroke Patients Only)       Balance Overall balance assessment: Needs assistance Sitting-balance support: No upper extremity supported;Feet unsupported Sitting balance-[name]: Good                                      Cognition Arousal/Alertness: Awake/alert Behavior During Therapy: WFL for tasks assessed/performed Overall Cognitive Status: Within Functional Limits for tasks assessed                                        Exercises Amputee Exercises Quad Sets: AROM;Both;10 reps;Supine Gluteal Sets: AROM;Both;10 reps;Supine Hip ABduction/ADduction: AROM;Left;10 reps;Supine Straight Leg Raises: AAROM;Right;10 reps;Supine    General Comments        Pertinent Vitals/Pain Pain Assessment: 0-10 Pain Score: 9  Pain Location: RLE, back Pain  Descriptors / Indicators: Grimacing;Guarding;Discomfort;Aching Pain Intervention(s): Monitored during session;Repositioned    Home Living                      Prior Function            PT Goals (current goals can now be found in the care plan section) Acute Rehab PT Goals Patient Stated Goal: Go to rehab and get stronger Progress towards PT goals: Progressing toward goals    Frequency    Min 4X/week      PT Plan Current plan remains appropriate    Co-evaluation              AM-PAC PT "6 Clicks" Mobility   Outcome Measure  Help  needed turning from your back to your side while in a flat bed without using bedrails?: A Little Help needed moving from lying on your back to sitting on the side of a flat bed without using bedrails?: A Little Help needed moving to and from a bed to a chair (including a wheelchair)?: Total Help needed standing up from a chair using your arms (e.g., wheelchair or bedside chair)?: A Little Help needed to walk in hospital room?: Total Help needed climbing 3-5 steps with a railing? : Total 6 Click Score: 12    End of Session Equipment Utilized During Treatment: Gait belt;Oxygen (2L on arrival. O2 removed for transfer with SpO2 90%) Activity Tolerance: Patient tolerated treatment well Patient left: in chair;with call bell/phone within reach;with family/visitor present (on [facility]) Nurse Communication: Mobility status;Other (comment) (Pt on BSC.) PT Visit Diagnosis: Other abnormalities of gait and mobility (R26.89);Muscle weakness (generalized) (M62.81);Pain Pain - Right/Left: Right Pain - part of body: Leg     Time: 0924-1030 PT Time Calculation (min) (ACUTE ONLY): 66 min  Charges:  $Therapeutic Activity: 53-67 mins                     Aida Raider, PT  Office # [phone] Pager [phone]    Ilda Foil 06/03/2021, 11:16 AM

## 2021-06-03 NOTE — Progress Notes (Signed)
PROGRESS NOTE        PATIENT DETAILS Name: [name] Age: 60 y.o. Sex: female Date of Birth: 25-May-1961 Admit Date: 05/26/2021 Admitting Physician Eduard Clos, MD TDV:[id number], Jilda Roche, DO  Brief Narrative: Patient is a 60 y.o. female with history of DM-2, CAD, HTN, MCTD, chronic right foot ulcer (refused BKA in the past as wanted to try hyperbaric therapy)-presenting with fever/RLE erythema-found to have sepsis and Enterococcus bacteremia.  Subjective: Continues to have pain in the right BKA stump-and back pain.  Objective: Vitals: Blood pressure (!) 162/68, pulse (!) 102, temperature 98.8 F (37.1 C), temperature source Oral, resp. rate 18, height 5\' 2"  (1.575 m), weight (!) 149.7 kg, SpO2 92 %.   Exam: Gen Exam:Alert awake-not in any distress HEENT:atraumatic, normocephalic Chest: B/L clear to auscultation anteriorly CVS:S1S2 regular Abdomen:soft non tender, non distended Extremities: S/p right BKA-mild erythema in left lower leg (chronic) Neurology: Non focal Skin: no rash   Pertinent Labs/Radiology:   Microbiology: 9/15>>Blood culture: 1/2: Enterococcus faecalis 9/17>> blood culture: No growth  Radiology/Echo: 9/15>> CT right foot: Chronic osteomyelitis at third metatarsal fracture site-soft tissue swelling consistent with cellulitis. 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  Intraoperative TEE on 9/21 without any vegetations.  Antibiotics narrowed to ampicillin-stop date 9/28.   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  Postoperative care deferred to orthopedics.  Continues to have significant pain at right BKA stump  and back pain-increase oxycodone to 20 mg every 6 as needed-remains on IV Dilaudid.  Per patient-any further increases in her Neurontin dosage makes her drowsy/hallucinate.  Hoping that over the next few days-her pain issues will gradually improve.  HTN: BP stable-continue Imdur, Avapro and Cardizem.  Follow and adjust.    Chronic pain syndrome: At baseline-continue as needed oxycodone and Neurontin.  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  I have placed her on MiraLAX-if no BM-May need Dulcolax suppository/enema.  CAD-s/p RCA PCI on 2019: No anginal symptoms-continue ASA  DM-2 (A1c 7.3 on 9/16): CBGs remain on the higher side-on [place] R 500 sliding scale as outpatient-continue Lantus 50 units daily/20 units of NovoLog with meals and SSI-reassess on 9/24.  Recent Labs    06/02/21 2042 06/02/21 2357 06/03/21 0756  GLUCAP 239* 225* 217*     HLD: Continue Zetia  MCTD: Continue Plaquenil  Hypothyroidism: Continue Synthroid  GERD: Continue PPI  Restless leg syndrome: Continue Mirapex  Bronchial asthma: Not in exacerbation-continue bronchodilators  Morbid Obesity: Estimated body mass index is 60.36 kg/m as calculated from the following:   Height as of this encounter: 5\' 2"  (1.575 m).   Weight as of this encounter: 149.7 kg.    Procedures :None Consults: DVT Prophylaxis: Lovenox Code Status:Full code  Family Communication: Spouse at bedside on 9/21  Time spent: 25 minutes-Greater than 50% of this time was spent in counseling, explanation of diagnosis, planning of further management, and coordination of care.  Diet: Diet Order             Diet heart healthy/carb modified Room service appropriate?  Yes; Fluid consistency: Thin  Diet effective now                      Disposition Plan: Status is: Inpatient  Remains inpatient appropriate because:Inpatient level of care  appropriate due to severity of illness  Dispo: The patient is from: Home              Anticipated d/c is to: Home              Patient currently is not medically stable to d/c.   Difficult to place patient No    Barriers to Discharge: Enterococcal bacteremia-s/p BKA on 9/21-await PT/OT to determine safe disposition.    Antimicrobial agents: Anti-infectives (From admission, onward)    Start     Dose/Rate Route Frequency Ordered Stop   06/02/21 1030  ampicillin (OMNIPEN) 2 g in sodium chloride 0.9 % 100 mL IVPB        2 g 300 mL/hr over 20 Minutes Intravenous Every 4 hours 06/02/21 0920 06/08/21 2359   05/28/21 1630  Ampicillin-Sulbactam (UNASYN) 3 g in sodium chloride 0.9 % 100 mL IVPB  Status:  Discontinued        3 g 200 mL/hr over 30 Minutes Intravenous Every 6 hours 05/28/21 0946 06/02/21 0920   05/27/21 2200  vancomycin (VANCOREADY) IVPB 1750 mg/350 mL  Status:  Discontinued        1,750 mg 175 mL/hr over 120 Minutes Intravenous Every 24 hours 05/27/21 0759 05/28/21 0849   05/27/21 2200  hydroxychloroquine (PLAQUENIL) tablet 200 mg        200 mg Oral 2 times daily 05/27/21 2004     05/27/21 1800  vancomycin (VANCOREADY) IVPB 1500 mg/300 mL  Status:  Discontinued        1,500 mg 150 mL/hr over 120 Minutes Intravenous Every 24 hours 05/26/21 1812 05/27/21 0757   05/27/21 0800  ceFEPIme (MAXIPIME) 2 g in sodium chloride 0.9 % 100 mL IVPB  Status:  Discontinued        2 g 200 mL/hr over 30 Minutes Intravenous Every 8 hours 05/27/21 0749 05/28/21 0849   05/27/21 0800  vancomycin (VANCOCIN) IVPB 1000 mg/200 mL premix        1,000 mg 200 mL/hr over 60 Minutes Intravenous  Once 05/27/21 0757 05/27/21 0948   05/26/21 2200  ceFEPIme (MAXIPIME) 2 g in sodium chloride 0.9 % 100 mL IVPB  Status:  Discontinued        2 g 200 mL/hr over 30 Minutes Intravenous Every 8 hours 05/26/21 1812 05/27/21 0749   05/26/21 1815  vancomycin (VANCOCIN) IVPB 1000 mg/200 mL premix        1,000 mg 200  mL/hr over 60 Minutes Intravenous Every hour 05/26/21 1812 05/26/21 2014   05/26/21 1745  vancomycin (VANCOCIN) 2,500 mg in sodium chloride 0.9 % 500 mL IVPB  Status:  Discontinued        2,500 mg 250 mL/hr over 120 Minutes Intravenous  Once 05/26/21 1741 05/26/21 1812        MEDICATIONS: Scheduled Meds:  acetaminophen  1,000 mg Oral Q8H   aspirin EC  81 mg Oral Daily   diltiazem  240 mg Oral Daily   enoxaparin (LOVENOX) injection  0.5 mg/kg Subcutaneous Daily   ezetimibe  10 mg Oral Daily   feeding supplement (GLUCERNA SHAKE)  237 mL Oral TID BM   gabapentin  400 mg Oral BID WC   gabapentin  800 mg Oral QHS  hydroxychloroquine  200 mg Oral BID   insulin aspart  0-20 Units Subcutaneous TID WC   insulin aspart  0-5 Units Subcutaneous QHS   insulin aspart  20 Units Subcutaneous TID WC   insulin glargine-yfgn  50 Units Subcutaneous Daily   irbesartan  150 mg Oral Daily   isosorbide mononitrate  30 mg Oral Daily   levothyroxine  200 mcg Oral QAC breakfast   loratadine  10 mg Oral QHS   multivitamin with minerals  1 tablet Oral Daily   mupirocin ointment   Topical TID   polyethylene glycol  17 g Oral BID   pramipexole  1 mg Oral BID   saccharomyces boulardii  250 mg Oral BID   Vitamin D (Ergocalciferol)  50,000 Units Oral Q7 days   Continuous Infusions:  ampicillin (OMNIPEN) IV 2 g (06/03/21 1109)   PRN Meds:.albuterol, bisacodyl, cyclobenzaprine, fluticasone, HYDROmorphone (DILAUDID) injection, magnesium oxide, nitroGLYCERIN, oxyCODONE, pantoprazole, phenol, pramipexole, promethazine, senna   I have personally reviewed following labs and imaging studies  LABORATORY DATA: CBC: Recent Labs  Lab 05/28/21 0057 05/29/21 0052 06/01/21 0225 06/02/21 0150  WBC 7.1 6.4 7.5 11.5*  HGB 10.4* 10.3* 11.7* 10.8*  HCT 35.2* 34.8* 37.6 35.5*  MCV 85.0 85.3 84.7 85.7  PLT 231 229 241 225     Basic Metabolic Panel: Recent Labs  Lab 05/28/21 0057 05/29/21 0052 06/01/21 0225  06/02/21 0150  NA 135 136 132* 135  K 3.7 3.7 4.5 4.7  CL 97* 98 96* 97*  CO2 GLUCOSE 236* 279* 306* 258*  BUN CREATININE 1.00 1.15* 1.02* 1.07*  CALCIUM 8.8* 8.6* 9.1 9.1     GFR: Estimated Creatinine Clearance: 80.3 mL/min (A) (by C-G formula based on SCr of 1.07 mg/dL (H)).  Liver Function Tests: Recent Labs  Lab 05/28/21 0057  AST 76*  ALT 36  ALKPHOS 37*  BILITOT 1.1  PROT 6.1*  ALBUMIN 2.9*    No results for input(s): LIPASE, AMYLASE in the last 168 hours. No results for input(s): AMMONIA in the last 168 hours.  Coagulation Profile: No results for input(s): INR, PROTIME in the last 168 hours.   Cardiac Enzymes: No results for input(s): CKTOTAL, CKMB, CKMBINDEX, TROPONINI in the last 168 hours.  BNP (last 3 results) No results for input(s): PROBNP in the last 8760 hours.  Lipid Profile: No results for input(s): CHOL, HDL, LDLCALC, TRIG, CHOLHDL, LDLDIRECT in the last 72 hours.  Thyroid Function Tests: No results for input(s): TSH, T4TOTAL, FREET4, T3FREE, THYROIDAB in the last 72 hours.  Anemia Panel: No results for input(s): VITAMINB12, FOLATE, FERRITIN, TIBC, IRON, RETICCTPCT in the last 72 hours.  Urine analysis:    Component Value Date/Time   COLORURINE YELLOW 02/03/2020 1630   APPEARANCEUR CLEAR 02/03/2020 1630   LABSPEC 1.008 02/03/2020 1630   PHURINE 5.0 02/03/2020 1630   GLUCOSEU NEGATIVE 02/03/2020 1630   HGBUR NEGATIVE 02/03/2020 1630   BILIRUBINUR NEGATIVE 02/03/2020 1630   KETONESUR NEGATIVE 02/03/2020 1630   PROTEINUR NEGATIVE 02/03/2020 1630   UROBILINOGEN 0.2 02/10/2015 1835   NITRITE NEGATIVE 02/03/2020 1630   LEUKOCYTESUR NEGATIVE 02/03/2020 1630    Sepsis Labs: Lactic Acid, Venous    Component Value Date/Time   LATICACIDVEN 2.1 (HH) 05/27/2021 0643    MICROBIOLOGY: Recent Results (from the past 240 hour(s))  Blood Culture (routine x 2)     Status: Abnormal   Collection Time: 05/26/21  5:00 PM    Specimen: Left  Antecubital; Blood  Result Value Ref Range Status   Specimen Description   Final    LEFT ANTECUBITAL Performed at [facility], [address] [place], [place] [address]    Special Requests   Final    BOTTLES DRAWN AEROBIC AND ANAEROBIC Blood Culture adequate volume Performed at [facility], [address] [place], [place] [address]    Culture  Setup Time   Final    GRAM POSITIVE COCCI IN CHAINS AEROBIC BOTTLE ONLY CRITICAL RESULT CALLED TO, READ BACK BY AND VERIFIED WITHCeledonio Miyamoto [facility] 9390 05/27/21 A BROWNING Performed at [facility], [address], [place], [place] [address]    Culture ENTEROCOCCUS FAECALIS (A)  Final   Report Status 05/29/2021 FINAL  Final   Organism ID, Bacteria ENTEROCOCCUS FAECALIS  Final      Susceptibility   Enterococcus faecalis - MIC*    AMPICILLIN <=2 SENSITIVE Sensitive     VANCOMYCIN 1 SENSITIVE Sensitive     GENTAMICIN SYNERGY SENSITIVE Sensitive     * ENTEROCOCCUS FAECALIS  Blood Culture (routine x 2)     Status: None   Collection Time: 05/26/21  5:00 PM   Specimen: Left Antecubital; Blood  Result Value Ref Range Status   Specimen Description   Final    LEFT ANTECUBITAL Performed at [facility], [address] [place], [place] [address]    Special Requests   Final    BOTTLES DRAWN AEROBIC AND ANAEROBIC Blood Culture adequate volume Performed at [facility], [address] [place], [place] [address]    Culture   Final    NO GROWTH 5 DAYS Performed at [facility], [address] [place], [place] [address]    Report Status 05/31/2021 FINAL  Final  Resp Panel by RT-PCR (Flu A&B, Covid) Nasopharyngeal Swab     Status: None   Collection Time: 05/26/21  5:00 PM   Specimen: Nasopharyngeal Swab; Nasopharyngeal(NP) swabs in vial transport medium  Result Value Ref Range Status   SARS Coronavirus 2 by RT PCR NEGATIVE NEGATIVE Final    Comment:  (NOTE) SARS-CoV-2 target nucleic acids are NOT DETECTED.  The SARS-CoV-2 RNA is generally detectable in upper respiratory specimens during the acute phase of infection. The lowest concentration of SARS-CoV-2 viral copies this assay can detect is 138 copies/mL. A negative result does not preclude SARS-Cov-2 infection and should not be used as the sole basis for treatment or other patient management decisions. A negative result may occur with  improper specimen collection/handling, submission of specimen other than nasopharyngeal swab, presence of viral mutation(s) within the areas targeted by this assay, and inadequate number of viral copies(<138 copies/mL). A negative result must be combined with clinical observations, patient history, and epidemiological information. The expected result is Negative.  Fact Sheet for Patients:  BloggerCourse.com  Fact Sheet for Healthcare Providers:  SeriousBroker.it  This test is no t yet approved or cleared by the [place] FDA and  has been authorized for detection and/or diagnosis of SARS-CoV-2 by FDA under an Emergency Use Authorization (EUA). This EUA will remain  in effect (meaning this test can be used) for the duration of the COVID-19 declaration under Section 564(b)(1) of the Act, 21 U.S.C.section 360bbb-3(b)(1), unless the authorization is terminated  or revoked sooner.       Influenza A by PCR NEGATIVE NEGATIVE Final   Influenza B by PCR NEGATIVE NEGATIVE Final  Comment: (NOTE) The Xpert Xpress SARS-CoV-2/FLU/RSV plus assay is intended as an aid in the diagnosis of influenza from Nasopharyngeal swab specimens and should not be used as a sole basis for treatment. Nasal washings and aspirates are unacceptable for Xpert Xpress SARS-CoV-2/FLU/RSV testing.  Fact Sheet for Patients: BloggerCourse.com  Fact Sheet for Healthcare  Providers: SeriousBroker.it  This test is not yet approved or cleared by the [place] FDA and has been authorized for detection and/or diagnosis of SARS-CoV-2 by FDA under an Emergency Use Authorization (EUA). This EUA will remain in effect (meaning this test can be used) for the duration of the COVID-19 declaration under Section 564(b)(1) of the Act, 21 U.S.C. section 360bbb-3(b)(1), unless the authorization is terminated or revoked.  Performed at [facility], [address] [place], [place] [address]   Blood Culture ID Panel (Reflexed)     Status: Abnormal   Collection Time: 05/26/21  5:00 PM  Result Value Ref Range Status   Enterococcus faecalis DETECTED (A) NOT DETECTED Final    Comment: CRITICAL RESULT CALLED TO, READ BACK BY AND VERIFIED WITH: Celedonio Miyamoto PHARMD 1624 05/27/21 A BROWNING    Enterococcus Faecium NOT DETECTED NOT DETECTED Final   Listeria monocytogenes NOT DETECTED NOT DETECTED Final   Staphylococcus species NOT DETECTED NOT DETECTED Final   Staphylococcus aureus (BCID) NOT DETECTED NOT DETECTED Final   Staphylococcus epidermidis NOT DETECTED NOT DETECTED Final   Staphylococcus lugdunensis NOT DETECTED NOT DETECTED Final   Streptococcus species NOT DETECTED NOT DETECTED Final   Streptococcus agalactiae NOT DETECTED NOT DETECTED Final   Streptococcus pneumoniae NOT DETECTED NOT DETECTED Final   Streptococcus pyogenes NOT DETECTED NOT DETECTED Final   A.calcoaceticus-baumannii NOT DETECTED NOT DETECTED Final   Bacteroides fragilis NOT DETECTED NOT DETECTED Final   Enterobacterales NOT DETECTED NOT DETECTED Final   Enterobacter cloacae complex NOT DETECTED NOT DETECTED Final   Escherichia coli NOT DETECTED NOT DETECTED Final   Klebsiella aerogenes NOT DETECTED NOT DETECTED Final   Klebsiella oxytoca NOT DETECTED NOT DETECTED Final   Klebsiella pneumoniae NOT DETECTED NOT DETECTED Final   Proteus species NOT DETECTED  NOT DETECTED Final   Salmonella species NOT DETECTED NOT DETECTED Final   Serratia marcescens NOT DETECTED NOT DETECTED Final   Haemophilus influenzae NOT DETECTED NOT DETECTED Final   Neisseria meningitidis NOT DETECTED NOT DETECTED Final   Pseudomonas aeruginosa NOT DETECTED NOT DETECTED Final   Stenotrophomonas maltophilia NOT DETECTED NOT DETECTED Final   Candida albicans NOT DETECTED NOT DETECTED Final   Candida auris NOT DETECTED NOT DETECTED Final   Candida glabrata NOT DETECTED NOT DETECTED Final   Candida krusei NOT DETECTED NOT DETECTED Final   Candida parapsilosis NOT DETECTED NOT DETECTED Final   Candida tropicalis NOT DETECTED NOT DETECTED Final   Cryptococcus neoformans/gattii NOT DETECTED NOT DETECTED Final   Vancomycin resistance NOT DETECTED NOT DETECTED Final    Comment: Performed at [facility], [address] [place], [place] [address]  Culture, blood (routine x 2)     Status: None   Collection Time: 05/28/21 12:57 AM   Specimen: BLOOD RIGHT HAND  Result Value Ref Range Status   Specimen Description BLOOD RIGHT HAND  Final   Special Requests   Final    BOTTLES DRAWN AEROBIC AND ANAEROBIC Blood Culture adequate volume   Culture   Final    NO GROWTH 5 DAYS Performed at [facility], [address], [place], [place] [address]  Report Status 06/02/2021 FINAL  Final  Culture, blood (routine x 2)     Status: None   Collection Time: 05/28/21 12:57 AM   Specimen: BLOOD  Result Value Ref Range Status   Specimen Description BLOOD RIGHT ANTECUBITAL  Final   Special Requests   Final    BOTTLES DRAWN AEROBIC AND ANAEROBIC Blood Culture adequate volume   Culture   Final    NO GROWTH 5 DAYS Performed at [facility], [address] [place], [place] [address]    Report Status 06/02/2021 FINAL  Final    RADIOLOGY STUDIES/RESULTS: No results found.   LOS: 7 days   Jeoffrey Massed, MD  [facility]    To contact the attending provider  between 7A-7P or the covering provider during after hours 7P-7A, please log into the web site [email or web address] and access using universal [place] password for that web site. If you do not have the password, please call the hospital operator.  06/03/2021, 12:16 PM

## 2021-06-03 NOTE — Evaluation (Signed)
Occupational Therapy Evaluation Patient Details Name: [name] MRN: [id number] DOB: 27-Jul-1961 Today's Date: 06/03/2021   History of Present Illness Pt is a 60 y.o. female admitted 05/26/21 with fever and RLE erythema; found to have sepsis due to RLE cellulitis/diabetic foot with Enterococcus bacteremia. 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 PMH includes asthma, CHF, CAD, HLD, HTN, DM2, obesity, connective tissue disease, chronic pain, R toe amputations (01/2020).   Clinical Impression   Patient admitted for the diagnosis and procedure above.  PTA she lives with her spouse, who is able to assist as needed.  She was able to walk household distances, and needed minimal assist with bathing and dressing with reacher and long handled shoe horn.  Barriers impacting independence are listed below.  Currently she is needing up to Max A of 2 for slide board transfers to a bariatric bedside commode, and up to Max A for lower body ADL at bed level.  OT will continue to follow in the acute setting, but CIR is recommended.  Patient would benefit from an intensive multi disciplined rehab approach to assist with maximizing function, and decreasing caregiver burden for an eventual return home.      Recommendations for follow up therapy are one component of a multi-disciplinary discharge planning process, led by the attending physician.  Recommendations may be updated based on patient status, additional functional criteria and insurance authorization.   Follow Up Recommendations  CIR    Equipment Recommendations  Wheelchair (measurements OT);Wheelchair cushion (measurements OT);Tub/shower bench    Recommendations for Other Services       Precautions / Restrictions Precautions Precautions: Fall Precaution Comments: body habitus Required Braces or Orthoses: Other Brace Other Brace: limb protector Restrictions Weight Bearing Restrictions: Yes RLE Weight Bearing: Non weight bearing      Mobility Bed  Mobility Overal bed mobility: Needs Assistance Bed Mobility: Supine to Sit     Supine to sit: Mod assist;HOB elevated     General bed mobility comments: Patient just returned to bed.  Slide board transfer times 2 this am.  Requesting to rest. Patient Response: Cooperative  Transfers Overall transfer level: Needs assistance Equipment used: Sliding board Transfers: Lateral/Scoot Transfers          Lateral/Scoot Transfers: +2 safety/equipment;Mod assist General transfer comment: attempted sit to stand in stedy x 2 trials but pt too wide to fit hips inside frame. She reports she feels too weak today to attempt sit to stand with RW. Slide board utilized for bed to [facility] transfer toward R. Husband supporting slide board while therapist blocked L knee and assisted with scooting. Increased time required.    Balance Overall balance assessment: Needs assistance Sitting-balance support: No upper extremity supported;Feet unsupported Sitting balance-[name]: Good                                     ADL either performed or assessed with clinical judgement   ADL Overall ADL's : Needs assistance/impaired Eating/Feeding: Independent;Bed level   Grooming: Wash/dry hands;Wash/dry face;Set up;Bed level   Upper Body Bathing: Minimal assistance;Bed level   Lower Body Bathing: Maximal assistance;Bed level   Upper Body Dressing : Minimal assistance;Bed level   Lower Body Dressing: Maximal assistance;Bed level                       Vision Patient Visual Report: No change from baseline  Pertinent Vitals/Pain Pain Assessment: Faces Pain Score: 9  Faces Pain Scale: Hurts little more Pain Location: RLE Pain Descriptors / Indicators: Grimacing;Guarding;Discomfort;Aching Pain Intervention(s): Monitored during session     Hand Dominance Right   Extremity/Trunk Assessment Upper Extremity Assessment Upper Extremity Assessment: Generalized  weakness   Lower Extremity Assessment Lower Extremity Assessment: Generalized weakness;RLE deficits/detail RLE Deficits / Details: BKA   Cervical / Trunk Assessment Cervical / Trunk Assessment: Normal   Communication Communication Communication: No difficulties   Cognition Arousal/Alertness: Awake/alert Behavior During Therapy: WFL for tasks assessed/performed Overall Cognitive Status: Within Functional Limits for tasks assessed                                     General Comments       Exercises Amputee Exercises Quad Sets: AROM;Both;10 reps;Supine Gluteal Sets: AROM;Both;10 reps;Supine Hip ABduction/ADduction: AROM;Left;10 reps;Supine Straight Leg Raises: AAROM;Right;10 reps;Supine   Shoulder Instructions      Home Living Family/patient expects to be discharged to:: Private residence Living Arrangements: Spouse/significant other Available Help at Discharge: Family;Available 24 hours/day Type of Home: House Home Access: Ramped entrance     Home Layout: Two level;Able to live on main level with bedroom/bathroom     Bathroom Shower/Tub: Tub/shower unit   Bathroom Toilet: Handicapped height     Home Equipment: Tub bench;Bedside commode;Walker - 4 wheels;Walker - 2 wheels;Wheelchair - manual   Additional Comments: Uses BSC and places it near the toilet.      Prior Functioning/Environment Level of Independence: Independent;Needs assistance  Gait / Transfers Assistance Needed: Independent without DME to walk household distances, will hold onto furniture if needed for balance. Does not walk outside, husband pushes her in w/c; sleeps in recliner often ADL's / Homemaking Assistance Needed: Husband does majority of cooking and cleaning, will assist with back and shoes/socks on occasion.            OT Problem List: Decreased strength;Decreased activity tolerance;Impaired balance (sitting and/or standing);Obesity;Pain      OT Treatment/Interventions:  [name]/ADL training;Therapeutic exercise;DME and/or AE instruction;Balance training;Patient/family education;Therapeutic activities    OT Goals(Current goals can be found in the care plan section) Acute Rehab OT Goals Patient Stated Goal: I want to do more and get stronger OT Goal Formulation: With patient Time For Goal Achievement: 06/17/21 Potential to Achieve Goals: Good ADL Goals Pt Will Perform Grooming: with set-up;sitting Pt Will Perform Upper Body Bathing: with set-up;sitting Pt Will Perform Upper Body Dressing: with set-up;sitting Pt Will Transfer to Toilet: stand pivot transfer;bedside commode;with mod assist;with +2 assist Pt Will Perform Toileting - Clothing Manipulation and hygiene: with min assist;sitting/lateral leans Pt/caregiver will Perform Home Exercise Program: Increased strength;Both right and left upper extremity;With theraband;With Supervision;With written HEP provided  OT Frequency: Min 2X/week   Barriers to D/C:    none noted       Co-evaluation              AM-PAC OT "6 Clicks" Daily Activity     Outcome Measure Help from another person eating meals?: None Help from another person taking care of personal grooming?: None Help from another person toileting, which includes using toliet, bedpan, or urinal?: A Lot Help from another person bathing (including washing, rinsing, drying)?: A Lot Help from another person to put on and taking off regular upper body clothing?: A Little Help from another person to put on and taking off regular lower body clothing?: A  Lot 6 Click Score: 17   End of Session Equipment Utilized During Treatment: Other (comment) (LH reacher) Nurse Communication: Mobility status  Activity Tolerance: Patient tolerated treatment well Patient left: in bed;with call bell/phone within reach;with bed alarm set  OT Visit Diagnosis: Unsteadiness on feet (R26.81);Muscle weakness (generalized) (M62.81);Pain Pain - Right/Left: Right Pain -  part of body: Leg                Time: 1130-1146 OT Time Calculation (min): 16 min Charges:  OT General Charges $OT Visit: 1 Visit OT Evaluation $OT Eval Moderate Complexity: 1 Mod  06/03/2021  RP, OTR/L  Acute Rehabilitation Services  Office:  [phone]   Suzanna Obey 06/03/2021, 11:55 AM

## 2021-06-04 DIAGNOSIS — I10 Essential (primary) hypertension: Secondary | ICD-10-CM | POA: Diagnosis not present

## 2021-06-04 DIAGNOSIS — R7881 Bacteremia: Secondary | ICD-10-CM | POA: Diagnosis not present

## 2021-06-04 DIAGNOSIS — E1169 Type 2 diabetes mellitus with other specified complication: Secondary | ICD-10-CM | POA: Diagnosis not present

## 2021-06-04 DIAGNOSIS — G894 Chronic pain syndrome: Secondary | ICD-10-CM | POA: Diagnosis not present

## 2021-06-04 LAB — CBC
HCT: 33.2 % — ABNORMAL LOW (ref 36.0–46.0)
Hemoglobin: 10.1 g/dL — ABNORMAL LOW (ref 12.0–15.0)
MCH: 26 pg (ref 26.0–34.0)
MCHC: 30.4 g/dL (ref 30.0–36.0)
MCV: 85.3 fL (ref 80.0–100.0)
Platelets: 218 10*3/uL (ref 150–400)
RBC: 3.89 MIL/uL (ref 3.87–5.11)
RDW: 17.1 % — ABNORMAL HIGH (ref 11.5–15.5)
WBC: 7.9 10*3/uL (ref 4.0–10.5)
nRBC: 0 % (ref 0.0–0.2)

## 2021-06-04 LAB — BASIC METABOLIC PANEL
Anion gap: 7 (ref 5–15)
BUN: 15 mg/dL (ref 6–20)
CO2: 28 mmol/L (ref 22–32)
Calcium: 8.5 mg/dL — ABNORMAL LOW (ref 8.9–10.3)
Chloride: 97 mmol/L — ABNORMAL LOW (ref 98–111)
Creatinine, Ser: 1.05 mg/dL — ABNORMAL HIGH (ref 0.44–1.00)
GFR, Estimated: >60 mL/min (ref >60)
Glucose, Bld: 212 mg/dL — ABNORMAL HIGH (ref 70–99)
Potassium: 4.4 mmol/L (ref 3.5–5.1)
Sodium: 132 mmol/L — ABNORMAL LOW (ref 135–145)

## 2021-06-04 LAB — GLUCOSE, CAPILLARY
Glucose-Capillary: 260 mg/dL — ABNORMAL HIGH (ref 70–99)
Glucose-Capillary: 249 mg/dL — ABNORMAL HIGH (ref 70–99)
Glucose-Capillary: 216 mg/dL — ABNORMAL HIGH (ref 70–99)
Glucose-Capillary: 172 mg/dL — ABNORMAL HIGH (ref 70–99)

## 2021-06-04 MED ORDER — SENNA 8.6 MG PO TABS
2.0000 | ORAL_TABLET | Freq: Every day | ORAL | Status: DC
  Administered 2021-06-04: 17.2 mg via ORAL
  Filled 2021-06-04 (×3): qty 2

## 2021-06-04 MED ORDER — INSULIN GLARGINE-YFGN 100 UNIT/ML ~~LOC~~ SOLN
56.0000 [IU] | Freq: Every day | SUBCUTANEOUS | Status: DC
  Administered 2021-06-04 – 2021-06-08 (×5): 56 [IU] via SUBCUTANEOUS
  Filled 2021-06-04 (×6): qty 0.56

## 2021-06-04 MED ORDER — INSULIN ASPART 100 UNIT/ML IJ SOLN
22.0000 [IU] | Freq: Three times a day (TID) | INTRAMUSCULAR | Status: DC
  Administered 2021-06-04 – 2021-06-08 (×14): 22 [IU] via SUBCUTANEOUS

## 2021-06-04 MED ORDER — BISACODYL 10 MG RE SUPP
10.0000 mg | Freq: Once | RECTAL | Status: AC
  Administered 2021-06-04: 10 mg via RECTAL
  Filled 2021-06-04: qty 1

## 2021-06-04 MED ORDER — HYDROCORTISONE 1 % EX CREA
1.0000 | TOPICAL_CREAM | Freq: Three times a day (TID) | CUTANEOUS | Status: DC | PRN
Start: 2021-06-04 — End: 2021-06-08
  Administered 2021-06-04 – 2021-06-05 (×2): 1 via TOPICAL
  Filled 2021-06-04: qty 28

## 2021-06-04 NOTE — Progress Notes (Signed)
Patient ID: [name], female   DOB: 1960/12/07, 60 y.o.   MRN: [id number] Patient is postoperative day 3 transtibial amputation.  She is sitting up in bed her right leg is elevated the compression stocking is in place and there is no drainage in the wound VAC canister patient is tearful stating that her whole body hurts including her back and leg.  I anticipate patient will need longer therapy than inpatient rehab but we will see how she progresses with therapy.

## 2021-06-04 NOTE — Progress Notes (Signed)
PROGRESS NOTE        PATIENT DETAILS Name: [name] Age: 60 y.o. Sex: female Date of Birth: 11-05-1960 Admit Date: 05/26/2021 Admitting Physician Eduard Clos, MD [id number], Jilda Roche, DO  Brief Narrative: Patient is a 60 y.o. female with history of DM-2, CAD, HTN, MCTD, chronic right foot ulcer (refused BKA in the past as wanted to try hyperbaric therapy)-presenting with fever/RLE erythema-found to have sepsis and Enterococcus bacteremia.  Subjective: Continues to have back/right BKA stump pain.  Objective: Vitals: Blood pressure (!) 150/51, pulse (!) 103, temperature 98.9 F (37.2 C), temperature source Oral, resp. rate 19, height 5\' 2"  (1.575 m), weight (!) 149.7 kg, SpO2 90 %.   Exam: Gen Exam:Alert awake-not in any distress HEENT:atraumatic, normocephalic Chest: B/L clear to auscultation anteriorly CVS:S1S2 regular Abdomen:soft non tender, non distended Extremities: S/p right BKA-no edema. Neurology: Non focal Skin: no rash   Pertinent Labs/Radiology: Hb: 10.1 Na: 132 Creatinine: 1.05  Microbiology: 9/15>>Blood culture: 1/2: Enterococcus faecalis 9/17>> blood culture: No growth  Radiology/Echo: 9/15>> CT right foot: Chronic osteomyelitis at third metatarsal fracture site-soft tissue swelling consistent with cellulitis. 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  Intraoperative TEE on 9/21 without any vegetations.  Antibiotics narrowed to ampicillin-stop date 9/28.  Continues to have significant right BKA stump pain/back pain-remains on oxycodone/Dilaudid.  Already on Neurontin-any further increase in dosage-makes patient drowsy/hallucinate (per patient history)  IBS diarrhea predominant:  Has not had a BM in several days-suspect she is developing constipation due to narcotics-she is already on MiraLAX/senna-we will ask RN to try Dulcolax suppository today.  Reassess on 9/25.  HTN: BP stable-continue Imdur, Avapro and Cardizem.  Follow and adjust.    Chronic pain syndrome: At baseline-continue as needed oxycodone and Neurontin.  CAD-s/p RCA PCI on 2019: No anginal symptoms-continue ASA  DM-2 (A1c 7.3 on 9/16): CBGs remain on the higher side-on Humulin R 500 sliding scale as outpatient-increased Lantus to 56 units, increase Premeal NovoLog to 22 units-continue SSI-reassess on 9/25.    Recent Labs    06/03/21 2021 06/04/21 0821 06/04/21 1213  GLUCAP 209* 260* 249*     HLD: Continue Zetia  MCTD: Continue Plaquenil  Hypothyroidism: Continue Synthroid  GERD: Continue PPI  Restless leg syndrome: Continue Mirapex  Bronchial asthma: Not in exacerbation-continue bronchodilators  Morbid Obesity: Estimated body mass index is 60.36 kg/m as calculated from the following:   Height as of this encounter: 5\' 2"  (1.575 m).   Weight as of this encounter: 149.7 kg.    Procedures :None Consults: DVT Prophylaxis: Lovenox Code Status:Full code  Family Communication: None at bedside.  Time spent: 25 minutes-Greater than 50% of this time was spent in counseling, explanation of diagnosis, planning of further management, and coordination of care.  Diet: Diet Order             Diet heart healthy/carb modified Room service appropriate? Yes; Fluid consistency: Thin  Diet effective now                      Disposition Plan: Status is: Inpatient  Remains inpatient appropriate because:Inpatient level of care appropriate due to severity of illness  Dispo: The patient is from: Home              Anticipated d/c is to: Home              Patient currently is not medically stable to d/c.   Difficult to place patient No    Barriers to Discharge: Enterococcal  bacteremia-s/p BKA on 9/21-await PT/OT to determine safe disposition.    Antimicrobial agents: Anti-infectives (From admission, onward)    Start     Dose/Rate Route Frequency Ordered Stop   06/02/21 1030  ampicillin (OMNIPEN) 2 g in sodium chloride 0.9 % 100 mL IVPB        2 g 300 mL/hr over 20 Minutes Intravenous Every 4 hours 06/02/21 0920 06/08/21 2359   05/28/21 1630  Ampicillin-Sulbactam (UNASYN) 3 g in sodium chloride 0.9 % 100 mL IVPB  Status:  Discontinued        3 g 200 mL/hr over 30 Minutes Intravenous Every 6 hours 05/28/21 0946 06/02/21 0920   05/27/21 2200  vancomycin (VANCOREADY) IVPB 1750 mg/350 mL  Status:  Discontinued        1,750 mg 175 mL/hr over 120 Minutes Intravenous Every 24 hours 05/27/21 0759 05/28/21 0849   05/27/21 2200  hydroxychloroquine (PLAQUENIL) tablet 200 mg        200 mg Oral 2 times daily 05/27/21 2004     05/27/21 1800  vancomycin (VANCOREADY) IVPB 1500 mg/300 mL  Status:  Discontinued        1,500 mg 150 mL/hr over 120 Minutes Intravenous Every 24 hours 05/26/21 1812 05/27/21 0757   05/27/21 0800  ceFEPIme (MAXIPIME) 2 g in sodium chloride 0.9 % 100 mL IVPB  Status:  Discontinued        2 g 200 mL/hr over 30 Minutes Intravenous Every 8 hours 05/27/21 0749 05/28/21 0849   05/27/21 0800  vancomycin (VANCOCIN) IVPB 1000 mg/200 mL premix        1,000 mg 200 mL/hr over 60 Minutes Intravenous  Once 05/27/21 0757 05/27/21 0948   05/26/21 2200  ceFEPIme (MAXIPIME) 2 g in sodium chloride 0.9 % 100 mL IVPB  Status:  Discontinued        2 g 200 mL/hr over 30 Minutes Intravenous Every 8 hours 05/26/21 1812 05/27/21 0749   05/26/21 1815  vancomycin (VANCOCIN) IVPB 1000 mg/200 mL premix        1,000 mg 200 mL/hr over 60 Minutes Intravenous Every hour 05/26/21 1812 05/26/21 2014   05/26/21 1745  vancomycin (VANCOCIN) 2,500 mg in sodium chloride 0.9 % 500 mL IVPB  Status:  Discontinued        2,500 mg 250 mL/hr over 120 Minutes Intravenous  Once 05/26/21 1741  05/26/21 1812        MEDICATIONS: Scheduled Meds:  acetaminophen  1,000 mg Oral Q8H   aspirin EC  81 mg Oral Daily   diltiazem  240 mg Oral Daily   enoxaparin (LOVENOX) injection  0.5 mg/kg Subcutaneous Daily   ezetimibe  10 mg Oral Daily   feeding supplement (GLUCERNA SHAKE)  237 mL Oral TID BM   gabapentin  400 mg Oral BID WC   gabapentin  800 mg Oral QHS   hydroxychloroquine  200 mg Oral BID   insulin aspart  0-20 Units Subcutaneous TID WC   insulin aspart  0-5 Units Subcutaneous QHS   insulin aspart  22 Units Subcutaneous TID WC   insulin glargine-yfgn  56 Units Subcutaneous Daily   irbesartan  150 mg Oral Daily   isosorbide mononitrate  30 mg Oral Daily   levothyroxine  200 mcg Oral QAC breakfast   loratadine  10 mg Oral QHS   multivitamin with minerals  1 tablet Oral Daily   mupirocin ointment   Topical TID   polyethylene glycol  17 g Oral BID   pramipexole  1 mg Oral BID   saccharomyces boulardii  250 mg Oral BID   Vitamin D (Ergocalciferol)  50,000 Units Oral Q7 days   Continuous Infusions:  ampicillin (OMNIPEN) IV 2 g (06/04/21 1028)   PRN Meds:.albuterol, bisacodyl, cyclobenzaprine, fluticasone, HYDROmorphone (DILAUDID) injection, magnesium oxide, nitroGLYCERIN, oxyCODONE, pantoprazole, phenol, pramipexole, promethazine, senna   I have personally reviewed following labs and imaging studies  LABORATORY DATA: CBC: Recent Labs  Lab 05/29/21 0052 06/01/21 0225 06/02/21 0150 06/04/21 0237  WBC 6.4 7.5 11.5* 7.9  HGB 10.3* 11.7* 10.8* 10.1*  HCT 34.8* 37.6 35.5* 33.2*  MCV 85.3 84.7 85.7 85.3  PLT 229 241 225 218     Basic Metabolic Panel: Recent Labs  Lab 05/29/21 0052 06/01/21 0225 06/02/21 0150 06/04/21 0237  NA 136 132* 135 132*  K 3.7 4.5 4.7 4.4  CL 98 96* 97* 97*  CO2 27 27 30 28   GLUCOSE 279* 306* 258* 212*  BUN 10 13 11 15   CREATININE 1.15* 1.02* 1.07* 1.05*  CALCIUM 8.6* 9.1 9.1 8.5*     GFR: Estimated Creatinine Clearance: 81.9  mL/min (A) (by C-G formula based on SCr of 1.05 mg/dL (H)).  Liver Function Tests: No results for input(s): AST, ALT, ALKPHOS, BILITOT, PROT, ALBUMIN in the last 168 hours.  No results for input(s): LIPASE, AMYLASE in the last 168 hours. No results for input(s): AMMONIA in the last 168 hours.  Coagulation Profile: No results for input(s): INR, PROTIME in the last 168 hours.   Cardiac Enzymes: No results for input(s): CKTOTAL, CKMB, CKMBINDEX, TROPONINI in the last 168 hours.  BNP (last 3 results) No results for input(s): PROBNP in the last 8760 hours.  Lipid Profile: No results for input(s): CHOL, HDL, LDLCALC, TRIG, CHOLHDL, LDLDIRECT in the last 72 hours.  Thyroid Function Tests: No results for input(s): TSH, T4TOTAL, FREET4, T3FREE, THYROIDAB in the last 72 hours.  Anemia Panel: No results for input(s): VITAMINB12, FOLATE, FERRITIN, TIBC, IRON, RETICCTPCT in the last 72 hours.  Urine analysis:    Component Value Date/Time   COLORURINE YELLOW 02/03/2020 1630   APPEARANCEUR CLEAR 02/03/2020 1630   LABSPEC 1.008 02/03/2020 1630   PHURINE 5.0 02/03/2020 1630   GLUCOSEU NEGATIVE 02/03/2020 1630   HGBUR NEGATIVE 02/03/2020 1630   BILIRUBINUR NEGATIVE 02/03/2020 1630   KETONESUR NEGATIVE 02/03/2020 1630   PROTEINUR NEGATIVE 02/03/2020 1630   UROBILINOGEN 0.2 02/10/2015 1835   NITRITE NEGATIVE 02/03/2020 1630   LEUKOCYTESUR NEGATIVE 02/03/2020 1630    Sepsis Labs: Lactic Acid, Venous    Component Value Date/Time   LATICACIDVEN 2.1 (HH) 05/27/2021 0643    MICROBIOLOGY: Recent Results (from the past 240 hour(s))  Blood Culture (routine x 2)     Status: Abnormal   Collection Time: 05/26/21  5:00 PM   Specimen: Left Antecubital; Blood  Result Value Ref Range Status   Specimen Description   Final    LEFT ANTECUBITAL Performed at [facility], [address] [place], [address] [place]    Special Requests   Final    BOTTLES DRAWN AEROBIC AND ANAEROBIC  Blood Culture adequate volume Performed at [facility]  [address], [address] [employer] [place], [place] [address]    Culture  Setup Time   Final    GRAM POSITIVE COCCI IN CHAINS AEROBIC BOTTLE ONLY CRITICAL RESULT CALLED TO, READ BACK BY AND VERIFIED WITHCeledonio Miyamoto [facility] 1638 05/27/21 A BROWNING Performed at [facility], [address], [place], [place] [address]    Culture ENTEROCOCCUS FAECALIS (A)  Final   Report Status 05/29/2021 FINAL  Final   Organism ID, Bacteria ENTEROCOCCUS FAECALIS  Final      Susceptibility   Enterococcus faecalis - MIC*    AMPICILLIN <=2 SENSITIVE Sensitive     VANCOMYCIN 1 SENSITIVE Sensitive     GENTAMICIN SYNERGY SENSITIVE Sensitive     * ENTEROCOCCUS FAECALIS  Blood Culture (routine x 2)     Status: None   Collection Time: 05/26/21  5:00 PM   Specimen: Left Antecubital; Blood  Result Value Ref Range Status   Specimen Description   Final    LEFT ANTECUBITAL Performed at [facility], [address] [place], [place] [address]    Special Requests   Final    BOTTLES DRAWN AEROBIC AND ANAEROBIC Blood Culture adequate volume Performed at [facility], [address] [place], [place] [address]    Culture   Final    NO GROWTH 5 DAYS Performed at [facility], [address] [place], [place] [address]    Report Status 05/31/2021 FINAL  Final  Resp Panel by RT-PCR (Flu A&B, Covid) Nasopharyngeal Swab     Status: None   Collection Time: 05/26/21  5:00 PM   Specimen: Nasopharyngeal Swab; Nasopharyngeal(NP) swabs in vial transport medium  Result Value Ref Range Status   SARS Coronavirus 2 by RT PCR NEGATIVE NEGATIVE Final    Comment: (NOTE) SARS-CoV-2 target nucleic acids are NOT DETECTED.  The SARS-CoV-2 RNA is generally detectable in upper respiratory specimens during the acute phase of infection. The lowest concentration of SARS-CoV-2 viral copies this assay can detect is 138 copies/mL. A negative result does  not preclude SARS-Cov-2 infection and should not be used as the sole basis for treatment or other patient management decisions. A negative result may occur with  improper specimen collection/handling, submission of specimen other than nasopharyngeal swab, presence of viral mutation(s) within the areas targeted by this assay, and inadequate number of viral copies(<138 copies/mL). A negative result must be combined with clinical observations, patient history, and epidemiological information. The expected result is Negative.  Fact Sheet for Patients:  BloggerCourse.com  Fact Sheet for Healthcare Providers:  SeriousBroker.it  This test is no t yet approved or cleared by the [place] FDA and  has been authorized for detection and/or diagnosis of SARS-CoV-2 by FDA under an Emergency Use Authorization (EUA). This EUA will remain  in effect (meaning this test can be used) for the duration of the COVID-19 declaration under Section 564(b)(1) of the Act, 21 U.S.C.section 360bbb-3(b)(1), unless the authorization is terminated  or revoked sooner.       Influenza A by PCR NEGATIVE NEGATIVE Final   Influenza B by PCR NEGATIVE NEGATIVE Final    Comment: (NOTE) The Xpert Xpress SARS-CoV-2/FLU/RSV plus assay is intended as an aid in the diagnosis of influenza from Nasopharyngeal swab specimens and should not be used as a sole basis for treatment. Nasal washings and aspirates are unacceptable for Xpert Xpress SARS-CoV-2/FLU/RSV testing.  Fact Sheet for Patients: BloggerCourse.com  Fact Sheet for Healthcare Providers: SeriousBroker.it  This test is not yet approved or cleared by the [place] and has been authorized for detection and/or diagnosis of SARS-CoV-2 by FDA under an Emergency Use Authorization (EUA). This EUA will remain in effect (meaning this test can be used) for the  duration of the COVID-19 declaration under Section 564(b)(1) of the Act, 21 U.S.C. section 360bbb-3(b)(1), unless the authorization is terminated or revoked.  Performed at [facility], [address] [place], [place] [address]   Blood Culture ID Panel (Reflexed)     Status: Abnormal   Collection Time: 05/26/21  5:00 PM  Result Value Ref Range Status   Enterococcus faecalis DETECTED (A) NOT DETECTED Final    Comment: CRITICAL RESULT CALLED TO, READ BACK BY AND VERIFIED WITH: Celedonio Miyamoto PHARMD 1624 05/27/21 A BROWNING    Enterococcus Faecium NOT DETECTED NOT DETECTED Final   Listeria monocytogenes NOT DETECTED NOT DETECTED Final   Staphylococcus species NOT DETECTED NOT DETECTED Final   Staphylococcus aureus (BCID) NOT DETECTED NOT DETECTED Final   Staphylococcus epidermidis NOT DETECTED NOT DETECTED Final   Staphylococcus lugdunensis NOT DETECTED NOT DETECTED Final   Streptococcus species NOT DETECTED NOT DETECTED Final   Streptococcus agalactiae NOT DETECTED NOT DETECTED Final   Streptococcus pneumoniae NOT DETECTED NOT DETECTED Final   Streptococcus pyogenes NOT DETECTED NOT DETECTED Final   A.calcoaceticus-baumannii NOT DETECTED NOT DETECTED Final   Bacteroides fragilis NOT DETECTED NOT DETECTED Final   Enterobacterales NOT DETECTED NOT DETECTED Final   Enterobacter cloacae complex NOT DETECTED NOT DETECTED Final   Escherichia coli NOT DETECTED NOT DETECTED Final   Klebsiella aerogenes NOT DETECTED NOT DETECTED Final   Klebsiella oxytoca NOT DETECTED NOT DETECTED Final   Klebsiella pneumoniae NOT DETECTED NOT DETECTED Final   Proteus species NOT DETECTED NOT DETECTED Final   Salmonella species NOT DETECTED NOT DETECTED Final   Serratia marcescens NOT DETECTED NOT DETECTED Final   Haemophilus influenzae NOT DETECTED NOT DETECTED Final   Neisseria meningitidis NOT DETECTED NOT DETECTED Final   Pseudomonas aeruginosa NOT DETECTED NOT DETECTED Final    Stenotrophomonas maltophilia NOT DETECTED NOT DETECTED Final   Candida albicans NOT DETECTED NOT DETECTED Final   Candida auris NOT DETECTED NOT DETECTED Final   Candida glabrata NOT DETECTED NOT DETECTED Final   Candida krusei NOT DETECTED NOT DETECTED Final   Candida parapsilosis NOT DETECTED NOT DETECTED Final   Candida tropicalis NOT DETECTED NOT DETECTED Final   Cryptococcus neoformans/gattii NOT DETECTED NOT DETECTED Final   Vancomycin resistance NOT DETECTED NOT DETECTED Final    Comment: Performed at [facility], [address] [place], [place] [address]  Culture, blood (routine x 2)     Status: None   Collection Time: 05/28/21 12:57 AM   Specimen: BLOOD RIGHT HAND  Result Value Ref Range Status   Specimen Description BLOOD RIGHT HAND  Final   Special Requests   Final    BOTTLES DRAWN AEROBIC AND ANAEROBIC Blood Culture adequate volume   Culture   Final    NO GROWTH 5 DAYS Performed at [facility], [address], [place], [place] [address]    Report Status 06/02/2021 FINAL  Final  Culture, blood (routine x 2)     Status: None   Collection Time: 05/28/21 12:57 AM   Specimen: BLOOD  Result Value Ref Range Status   Specimen Description BLOOD RIGHT ANTECUBITAL  Final   Special Requests   Final    BOTTLES DRAWN  AEROBIC AND ANAEROBIC Blood Culture adequate volume   Culture   Final    NO GROWTH 5 DAYS Performed at [facility], [address] [place], [place] [address]    Report Status 06/02/2021 FINAL  Final    RADIOLOGY STUDIES/RESULTS: No results found.   LOS: 8 days   Jeoffrey Massed, MD  [facility]    To contact the attending provider between 7A-7P or the covering provider during after hours 7P-7A, please log into the web site [email or web address] and access using universal [place] password for that web site. If you do not have the password, please call the hospital operator.  06/04/2021, 3:39 PM

## 2021-06-05 DIAGNOSIS — R7881 Bacteremia: Secondary | ICD-10-CM | POA: Diagnosis not present

## 2021-06-05 DIAGNOSIS — B952 Enterococcus as the cause of diseases classified elsewhere: Secondary | ICD-10-CM | POA: Diagnosis not present

## 2021-06-05 LAB — GLUCOSE, CAPILLARY
Glucose-Capillary: 190 mg/dL — ABNORMAL HIGH (ref 70–99)
Glucose-Capillary: 206 mg/dL — ABNORMAL HIGH (ref 70–99)
Glucose-Capillary: 157 mg/dL — ABNORMAL HIGH (ref 70–99)
Glucose-Capillary: 218 mg/dL — ABNORMAL HIGH (ref 70–99)

## 2021-06-05 MED ORDER — CYCLOBENZAPRINE HCL 5 MG PO TABS
10.0000 mg | ORAL_TABLET | Freq: Three times a day (TID) | ORAL | Status: DC
  Administered 2021-06-05 – 2021-06-08 (×12): 10 mg via ORAL
  Filled 2021-06-05 (×11): qty 2

## 2021-06-05 NOTE — Progress Notes (Addendum)
PROGRESS NOTE        PATIENT DETAILS Name: [name] Age: 60 y.o. Sex: female Date of Birth: 1960-12-02 Admit Date: 05/26/2021 Admitting Physician Eduard Clos, MD [id number], Jilda Roche, DO  Brief Narrative: Patient is a 60 y.o. female with history of DM-2, CAD, HTN, MCTD, chronic right foot ulcer (refused BKA in the past as wanted to try hyperbaric therapy)-presenting with fever/RLE erythema-found to have sepsis and Enterococcus bacteremia.  Subjective:  Patient in bed, appears comfortable, denies any headache, no fever, no chest pain or pressure, no shortness of breath , no abdominal pain. No new focal weakness. +ve but better R.BKA stump pain, +ve acute on chronic low back pain and spasms.   Objective: Vitals: Blood pressure 124/61, pulse 86, temperature 98.3 F (36.8 C), temperature source Oral, resp. rate 20, height 5\' 2"  (1.575 m), weight (!) 149.7 kg, SpO2 97 %.   Exam:  Awake Alert, No new F.N deficits, Normal affect [place].AT,PERRAL Supple Neck,No JVD, No cervical lymphadenopathy appriciated.  Symmetrical Chest wall movement, Good air movement bilaterally, CTAB RRR,No Gallops, Rubs or new Murmurs, No Parasternal Heave +ve B.Sounds, Abd Soft, No tenderness, No organomegaly appriciated, No rebound - guarding or rigidity. R BKA stump under bandage/wrap.   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  Intraoperative TEE on 9/21 without any vegetations.  Antibiotics narrowed to ampicillin-stop date 06/08/21.  Continues to have significant right BKA stump pain/back pain-remains on oxycodone/Dilaudid.  Already on Neurontin-any further increase in dosage-makes patient drowsy/hallucinate (per patient history), made Flexeril scheduled for back spasms.  IBS diarrhea predominant: Has not  had a BM in several days-suspect she is developing constipation due to narcotics-she is already on MiraLAX/senna-we will ask RN to try Dulcolax suppository today.  Reassess on 9/25.  HTN: BP stable-continue Imdur, Avapro and Cardizem.  Follow and adjust.    Chronic pain syndrome: At baseline-continue as needed oxycodone and Neurontin.  CAD-s/p RCA PCI on 2019: No anginal symptoms-continue ASA  HLD: Continue Zetia  MCTD: Continue Plaquenil  Hypothyroidism: Continue Synthroid  GERD: Continue PPI  Restless leg syndrome: Continue Mirapex  Bronchial asthma: Not in exacerbation-continue bronchodilators  Morbid Obesity: BMI 60, follow with PCP for weight loss, needs outpt Sleep study as well.  DM-2 (A1c 7.3 on 9/16): CBGs remain on the higher side-on Humulin R 500 sliding scale as outpatient-increased Lantus to 56 units, increase Premeal NovoLog to 22 units-continue SSI-reassess on 9/25.    CBG (last 3)  Recent Labs    06/04/21 1643 06/04/21 2031 06/05/21 0814  GLUCAP 216* 172* 190*      Procedures :   R. BKA 06/01/21  Radiology/Echo: 9/15>> CT right foot: Chronic osteomyelitis at third metatarsal fracture site-soft tissue swelling consistent with cellulitis. 9/18>> TTE: EF 55-60%, no obvious vegetation 9/21>> TEE: No vegetations  Consults: DVT Prophylaxis: Lovenox Code Status:Full code  Family Communication: None at bedside.  Time spent: 25 minutes-Greater than 50% of this time was spent in counseling, explanation of diagnosis, planning of further management, and coordination of care.  Diet: Diet Order             Diet heart healthy/carb modified Room service appropriate? Yes; Fluid consistency: Thin  Diet effective now  Disposition Plan: Status is: Inpatient  Remains inpatient appropriate because:Inpatient level of care appropriate due to severity of illness  Dispo: The patient is from: Home              Anticipated d/c is to: Home               Patient currently is not medically stable to d/c.   Difficult to place patient No   Barriers to Discharge: Enterococcal bacteremia-s/p BKA on 9/21-await PT/OT to determine safe disposition.    Antimicrobial agents: Anti-infectives (From admission, onward)    Start     Dose/Rate Route Frequency Ordered Stop   06/02/21 1030  ampicillin (OMNIPEN) 2 g in sodium chloride 0.9 % 100 mL IVPB        2 g 300 mL/hr over 20 Minutes Intravenous Every 4 hours 06/02/21 0920 06/08/21 2359   05/28/21 1630  Ampicillin-Sulbactam (UNASYN) 3 g in sodium chloride 0.9 % 100 mL IVPB  Status:  Discontinued        3 g 200 mL/hr over 30 Minutes Intravenous Every 6 hours 05/28/21 0946 06/02/21 0920   05/27/21 2200  vancomycin (VANCOREADY) IVPB 1750 mg/350 mL  Status:  Discontinued        1,750 mg 175 mL/hr over 120 Minutes Intravenous Every 24 hours 05/27/21 0759 05/28/21 0849   05/27/21 2200  hydroxychloroquine (PLAQUENIL) tablet 200 mg        200 mg Oral 2 times daily 05/27/21 2004     05/27/21 1800  vancomycin (VANCOREADY) IVPB 1500 mg/300 mL  Status:  Discontinued        1,500 mg 150 mL/hr over 120 Minutes Intravenous Every 24 hours 05/26/21 1812 05/27/21 0757   05/27/21 0800  ceFEPIme (MAXIPIME) 2 g in sodium chloride 0.9 % 100 mL IVPB  Status:  Discontinued        2 g 200 mL/hr over 30 Minutes Intravenous Every 8 hours 05/27/21 0749 05/28/21 0849   05/27/21 0800  vancomycin (VANCOCIN) IVPB 1000 mg/200 mL premix        1,000 mg 200 mL/hr over 60 Minutes Intravenous  Once 05/27/21 0757 05/27/21 0948   05/26/21 2200  ceFEPIme (MAXIPIME) 2 g in sodium chloride 0.9 % 100 mL IVPB  Status:  Discontinued        2 g 200 mL/hr over 30 Minutes Intravenous Every 8 hours 05/26/21 1812 05/27/21 0749   05/26/21 1815  vancomycin (VANCOCIN) IVPB 1000 mg/200 mL premix        1,000 mg 200 mL/hr over 60 Minutes Intravenous Every hour 05/26/21 1812 05/26/21 2014   05/26/21 1745  vancomycin (VANCOCIN) 2,500 mg in  sodium chloride 0.9 % 500 mL IVPB  Status:  Discontinued        2,500 mg 250 mL/hr over 120 Minutes Intravenous  Once 05/26/21 1741 05/26/21 1812        MEDICATIONS: Scheduled Meds:  acetaminophen  1,000 mg Oral Q8H   aspirin EC  81 mg Oral Daily   cyclobenzaprine  10 mg Oral TID   diltiazem  240 mg Oral Daily   enoxaparin (LOVENOX) injection  0.5 mg/kg Subcutaneous Daily   ezetimibe  10 mg Oral Daily   feeding supplement (GLUCERNA SHAKE)  237 mL Oral TID BM   gabapentin  400 mg Oral BID WC   gabapentin  800 mg Oral QHS   hydroxychloroquine  200 mg Oral BID   insulin aspart  0-20 Units Subcutaneous TID WC   insulin aspart  0-5 Units Subcutaneous QHS   insulin aspart  22 Units Subcutaneous TID WC   insulin glargine-yfgn  56 Units Subcutaneous Daily   irbesartan  150 mg Oral Daily   isosorbide mononitrate  30 mg Oral Daily   levothyroxine  200 mcg Oral QAC breakfast   loratadine  10 mg Oral QHS   multivitamin with minerals  1 tablet Oral Daily   mupirocin ointment   Topical TID   polyethylene glycol  17 g Oral BID   pramipexole  1 mg Oral BID   saccharomyces boulardii  250 mg Oral BID   senna  2 tablet Oral QHS   Vitamin D (Ergocalciferol)  50,000 Units Oral Q7 days   Continuous Infusions:  ampicillin (OMNIPEN) IV 2 g (06/05/21 0951)   PRN Meds:.albuterol, bisacodyl, fluticasone, hydrocortisone cream, HYDROmorphone (DILAUDID) injection, magnesium oxide, nitroGLYCERIN, oxyCODONE, pantoprazole, phenol, pramipexole, promethazine   I have personally reviewed following labs and imaging studies  LABORATORY DATA:  Recent Labs  Lab 06/01/21 0225 06/02/21 0150 06/04/21 0237  WBC 7.5 11.5* 7.9  HGB 11.7* 10.8* 10.1*  HCT 37.6 35.5* 33.2*  PLT 241 225 218  MCV 84.7 85.7 85.3  MCH 26.4 26.1 26.0  MCHC 31.1 30.4 30.4  RDW 16.6* 17.0* 17.1*    Recent Labs  Lab 06/01/21 0225 06/02/21 0150 06/04/21 0237  NA 132* 135 132*  K 4.5 4.7 4.4  CL 96* 97* 97*  CO2 27 30 28    GLUCOSE 306* 258* 212*  BUN 13 11 15   CREATININE 1.02* 1.07* 1.05*  CALCIUM 9.1 9.1 8.5*      GFR: Estimated Creatinine Clearance: 81.9 mL/min (A) (by C-G formula based on SCr of 1.05 mg/dL (H)).   Sepsis Labs: Lactic Acid, Venous    Component Value Date/Time   LATICACIDVEN 2.1 (HH) 05/27/2021    RADIOLOGY STUDIES/RESULTS: No results found.   LOS: 9 days   Signature  05/29/2021 M.D on 06/05/2021 at 9:57 AM   -  To page go to [email or web address]

## 2021-06-05 NOTE — Plan of Care (Signed)

## 2021-06-06 ENCOUNTER — Encounter (HOSPITAL_BASED_OUTPATIENT_CLINIC_OR_DEPARTMENT_OTHER): Payer: 59 | Admitting: Internal Medicine

## 2021-06-06 DIAGNOSIS — R7881 Bacteremia: Secondary | ICD-10-CM | POA: Diagnosis not present

## 2021-06-06 DIAGNOSIS — B952 Enterococcus as the cause of diseases classified elsewhere: Secondary | ICD-10-CM | POA: Diagnosis not present

## 2021-06-06 LAB — GLUCOSE, CAPILLARY
Glucose-Capillary: 218 mg/dL — ABNORMAL HIGH (ref 70–99)
Glucose-Capillary: 218 mg/dL — ABNORMAL HIGH (ref 70–99)
Glucose-Capillary: 114 mg/dL — ABNORMAL HIGH (ref 70–99)
Glucose-Capillary: 124 mg/dL — ABNORMAL HIGH (ref 70–99)

## 2021-06-06 LAB — PROCALCITONIN: Procalcitonin: 0.11 ng/mL

## 2021-06-06 NOTE — Progress Notes (Signed)
Physical Therapy Treatment Patient Details Name: [name] MRN: [id number] DOB: 1961/06/22 Today's Date: 06/06/2021   History of Present Illness Pt is a 60 y.o. female admitted 05/26/21 with fever and RLE erythema; found to have sepsis due to RLE cellulitis/diabetic foot with Enterococcus bacteremia. 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 PMH includes asthma, CHF, CAD, HLD, HTN, DM2, obesity, connective tissue disease, chronic pain, R toe amputations (01/2020).    PT Comments    Patient highly motivated to participate and increase her independence. Educated on importance of maintaining knee extension, use of limb protector, and positioning throughout the day. Educated in LE exercises and handout provided. Patient able to stand pivot/hop from bed to recliner with RW and 2 person min assist (for safety). Stood multiple times at sink to attempt ADLs, however somewhat limited by low back pain. Discussed future use of wheelchair and pt is concerned that her UE strength will not be sufficient. Discussed alternatives to wheelchair and negatives/risks to using alternatives.     Recommendations for follow up therapy are one component of a multi-disciplinary discharge planning process, led by the attending physician.  Recommendations may be updated based on patient status, additional functional criteria and insurance authorization.  Follow Up Recommendations  CIR     Equipment Recommendations  Other (comment) (drop-arm BSC, slide board)    Recommendations for Other Services OT consult     Precautions / Restrictions Precautions Precautions: Fall Required Braces or Orthoses: Other Brace Other Brace: limb protector     Mobility  Bed Mobility               General bed mobility comments: sitting EOB with RLE up on bed with knee extended    Transfers Overall transfer level: Needs assistance Equipment used: Rolling walker (2 wheeled) Transfers: Pharmacologist;Sit to/from Stand Sit to  Stand: Min assist;+2 safety/equipment Stand pivot transfers: Min assist;+2 safety/equipment       General transfer comment: stood from EOB x 1; from recliner to sink x 3; see OT note re: ADLs at sink  Ambulation/Gait             General Gait Details: pivotal hops from bed to recliner on her left   Stairs             Wheelchair Mobility    Modified Rankin (Stroke Patients Only)       Balance Overall balance assessment: Needs assistance Sitting-balance support: No upper extremity supported;Feet unsupported Sitting balance-[name]: Good     Standing balance support: Bilateral upper extremity supported Standing balance-[name]: Poor Standing balance comment: bil UE support needed in standing; stood for max 30 seconds (limited by back pain)                            Cognition Arousal/Alertness: Awake/alert Behavior During Therapy: WFL for tasks assessed/performed Overall Cognitive Status: Within Functional Limits for tasks assessed                                        Exercises Amputee Exercises Quad Sets: AROM;Both;5 reps;Seated Hip ABduction/ADduction: AROM;Right;5 reps;Seated Knee Flexion: AROM;Right;10 reps;Seated Knee Extension: AROM;10 reps;Seated;Both Other Exercises Other Exercises: Cues for all exercises; educated on importance of full knee extension    General Comments        Pertinent Vitals/Pain Pain Assessment: Faces Faces Pain Scale: Hurts  whole lot Pain Location: back with standing Pain Descriptors / Indicators: Grimacing;Guarding;Discomfort;Aching Pain Intervention(s): Premedicated before session;Monitored during session;Limited activity within patient's tolerance;Repositioned    Home Living                      Prior Function            PT Goals (current goals can now be found in the care plan section) Acute Rehab PT Goals Patient Stated Goal: I want to do more and get stronger PT  Goal Formulation: With patient/family Time For Goal Achievement: 06/16/21 Potential to Achieve Goals: Good Progress towards PT goals: Progressing toward goals    Frequency    Min 4X/week      PT Plan Current plan remains appropriate    Co-evaluation PT/OT/SLP Co-Evaluation/Treatment: Yes Reason for Co-Treatment: For patient/therapist safety;To address functional/ADL transfers PT goals addressed during session: Mobility/safety with mobility;Balance;Strengthening/ROM;Proper use of DME        AM-PAC PT "6 Clicks" Mobility   Outcome Measure  Help needed turning from your back to your side while in a flat bed without using bedrails?: A Little Help needed moving from lying on your back to sitting on the side of a flat bed without using bedrails?: A Little Help needed moving to and from a bed to a chair (including a wheelchair)?: Total Help needed standing up from a chair using your arms (e.g., wheelchair or bedside chair)?: Total Help needed to walk in hospital room?: Total Help needed climbing 3-5 steps with a railing? : Total 6 Click Score: 10    End of Session Equipment Utilized During Treatment: Gait belt Activity Tolerance: Patient limited by pain (back pain despite pre-medication ~45 minutes prior to session) Patient left: in chair;with call bell/phone within reach;with nursing/sitter in room Nurse Communication: Mobility status PT Visit Diagnosis: Other abnormalities of gait and mobility (R26.89);Muscle weakness (generalized) (M62.81);Pain Pain - Right/Left: Right Pain - part of body: Leg     Time: 5465-0354 PT Time Calculation (min) (ACUTE ONLY): 42 min  Charges:  $Therapeutic Exercise: 8-22 mins $Therapeutic Activity: 8-22 mins                      Jerolyn Center, PT Pager [phone]    Zena Amos 06/06/2021, 12:23 PM

## 2021-06-06 NOTE — Plan of Care (Signed)

## 2021-06-06 NOTE — Progress Notes (Signed)
PROGRESS NOTE        PATIENT DETAILS Name: [name] Age: 60 y.o. Sex: female Date of Birth: [birth year] Admit Date: 05/26/2021 Admitting Physician Eduard Clos, MD [id number], Jilda Roche, DO  Brief Narrative: Patient is a 60 y.o. female with history of DM-2, CAD, HTN, MCTD, chronic right foot ulcer (refused BKA in the past as wanted to try hyperbaric therapy)-presenting with fever/RLE erythema-found to have sepsis and Enterococcus bacteremia.  Subjective:  Patient in bed, appears comfortable, denies any headache, no fever, no chest pain or pressure, no shortness of breath , no abdominal pain. No new focal weakness.  Continues to have chronic low back pain for which she can follow-up with neurosurgery outpatient.    Objective: Vitals: Blood pressure 110/65, pulse 95, temperature 99.1 F (37.3 C), temperature source Oral, resp. rate 15, height 5\' 2"  (1.575 m), weight (!) 155 kg, SpO2 91 %.   Exam:  Awake Alert, No new F.N deficits, Normal affect [place].AT,PERRAL Supple Neck,No JVD, No cervical lymphadenopathy appriciated.  Symmetrical Chest wall movement, Good air movement bilaterally, CTAB RRR,No Gallops, Rubs or new Murmurs, No Parasternal Heave +ve B.Sounds, Abd Soft, No tenderness, No organomegaly appriciated, No rebound - guarding or rigidity. R BKA stump under bandage/wrap.   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  Intraoperative TEE on 9/21 without any vegetations.  Antibiotics narrowed to ampicillin-stop date 06/08/21.  Continues to have significant right BKA stump pain/back pain-remains on oxycodone/Dilaudid.  Already on Neurontin-any further increase in dosage-makes patient drowsy/hallucinate (per patient history), made Flexeril scheduled for back spasms.  IBS diarrhea  predominant: Has not had a BM in several days-suspect she is developing constipation due to narcotics-she is already on MiraLAX/senna-we will ask RN to try Dulcolax suppository today.  Reassess on 9/25.  HTN: BP stable-continue Imdur, Avapro and Cardizem.  Follow and adjust.    Chronic pain syndrome: At baseline-continue as needed oxycodone and Neurontin.  Outpatient neurosurgery follow-up post discharge.  CAD-s/p RCA PCI on 2019: No anginal symptoms-continue ASA  HLD: Continue Zetia  MCTD: Continue Plaquenil  Hypothyroidism: Continue Synthroid  GERD: Continue PPI  Restless leg syndrome: Continue Mirapex  Bronchial asthma: Not in exacerbation-continue bronchodilators  Morbid Obesity: BMI 60, follow with PCP for weight loss, needs outpt Sleep study as well.  DM-2 (A1c 7.3 on 9/16): CBGs remain on the higher side-on Humulin R 500 sliding scale as outpatient-increased Lantus to 56 units, increase Premeal NovoLog to 22 units-continue SSI-reassess on 9/25.    CBG (last 3)  Recent Labs    06/05/21 2027 06/06/21 0736 06/06/21 1220  GLUCAP 218* 218* 218*      Procedures :   R. BKA 06/01/21  Radiology/Echo: 9/15>> CT right foot: Chronic osteomyelitis at third metatarsal fracture site-soft tissue swelling consistent with cellulitis. 9/18>> TTE: EF 55-60%, no obvious vegetation 9/21>> TEE: No vegetations  Consults: DVT Prophylaxis: Lovenox Code Status:Full code  Family Communication: None at bedside.  Time spent: 25 minutes-Greater than 50% of this time was spent in counseling, explanation of diagnosis, planning of further management, and coordination of care.  Diet: Diet Order             Diet heart healthy/carb modified Room service appropriate? Yes; Fluid consistency: Thin  Diet  effective now                    Disposition Plan: Status is: Inpatient  Remains inpatient appropriate because:Inpatient level of care appropriate due to severity of  illness  Dispo: The patient is from: Home              Anticipated d/c is to: Home              Patient currently is not medically stable to d/c.   Difficult to place patient No   Barriers to Discharge: Enterococcal bacteremia-s/p BKA on 9/21-await PT/OT to determine safe disposition.    Antimicrobial agents: Anti-infectives (From admission, onward)    Start     Dose/Rate Route Frequency Ordered Stop   06/02/21 1030  ampicillin (OMNIPEN) 2 g in sodium chloride 0.9 % 100 mL IVPB        2 g 300 mL/hr over 20 Minutes Intravenous Every 4 hours 06/02/21 0920 06/09/21 0359   05/28/21 1630  Ampicillin-Sulbactam (UNASYN) 3 g in sodium chloride 0.9 % 100 mL IVPB  Status:  Discontinued        3 g 200 mL/hr over 30 Minutes Intravenous Every 6 hours 05/28/21 0946 06/02/21 0920   05/27/21 2200  vancomycin (VANCOREADY) IVPB 1750 mg/350 mL  Status:  Discontinued        1,750 mg 175 mL/hr over 120 Minutes Intravenous Every 24 hours 05/27/21 0759 05/28/21 0849   05/27/21 2200  hydroxychloroquine (PLAQUENIL) tablet 200 mg        200 mg Oral 2 times daily 05/27/21 2004     05/27/21 1800  vancomycin (VANCOREADY) IVPB 1500 mg/300 mL  Status:  Discontinued        1,500 mg 150 mL/hr over 120 Minutes Intravenous Every 24 hours 05/26/21 1812 05/27/21 0757   05/27/21 0800  ceFEPIme (MAXIPIME) 2 g in sodium chloride 0.9 % 100 mL IVPB  Status:  Discontinued        2 g 200 mL/hr over 30 Minutes Intravenous Every 8 hours 05/27/21 0749 05/28/21 0849   05/27/21 0800  vancomycin (VANCOCIN) IVPB 1000 mg/200 mL premix        1,000 mg 200 mL/hr over 60 Minutes Intravenous  Once 05/27/21 0757 05/27/21 0948   05/26/21 2200  ceFEPIme (MAXIPIME) 2 g in sodium chloride 0.9 % 100 mL IVPB  Status:  Discontinued        2 g 200 mL/hr over 30 Minutes Intravenous Every 8 hours 05/26/21 1812 05/27/21 0749   05/26/21 1815  vancomycin (VANCOCIN) IVPB 1000 mg/200 mL premix        1,000 mg 200 mL/hr over 60 Minutes Intravenous  Every hour 05/26/21 1812 05/26/21 2014   05/26/21 1745  vancomycin (VANCOCIN) 2,500 mg in sodium chloride 0.9 % 500 mL IVPB  Status:  Discontinued        2,500 mg 250 mL/hr over 120 Minutes Intravenous  Once 05/26/21 1741 05/26/21 1812        MEDICATIONS: Scheduled Meds:  acetaminophen  1,000 mg Oral Q8H   aspirin EC  81 mg Oral Daily   cyclobenzaprine  10 mg Oral TID   diltiazem  240 mg Oral Daily   enoxaparin (LOVENOX) injection  0.5 mg/kg Subcutaneous Daily   ezetimibe  10 mg Oral Daily   feeding supplement (GLUCERNA SHAKE)  237 mL Oral TID BM   gabapentin  400 mg Oral BID WC   gabapentin  800 mg Oral QHS  hydroxychloroquine  200 mg Oral BID   insulin aspart  0-20 Units Subcutaneous TID WC   insulin aspart  0-5 Units Subcutaneous QHS   insulin aspart  22 Units Subcutaneous TID WC   insulin glargine-yfgn  56 Units Subcutaneous Daily   irbesartan  150 mg Oral Daily   isosorbide mononitrate  30 mg Oral Daily   levothyroxine  200 mcg Oral QAC breakfast   loratadine  10 mg Oral QHS   multivitamin with minerals  1 tablet Oral Daily   mupirocin ointment   Topical TID   polyethylene glycol  17 g Oral BID   pramipexole  1 mg Oral BID   saccharomyces boulardii  250 mg Oral BID   senna  2 tablet Oral QHS   Vitamin D (Ergocalciferol)  50,000 Units Oral Q7 days   Continuous Infusions:  ampicillin (OMNIPEN) IV 2 g (06/06/21 0754)   PRN Meds:.albuterol, bisacodyl, fluticasone, hydrocortisone cream, HYDROmorphone (DILAUDID) injection, magnesium oxide, nitroGLYCERIN, oxyCODONE, pantoprazole, phenol, pramipexole, promethazine   I have personally reviewed following labs and imaging studies  LABORATORY DATA:  Recent Labs  Lab 06/01/21 0225 06/02/21 0150 06/04/21 0237  WBC 7.5 11.5* 7.9  HGB 11.7* 10.8* 10.1*  HCT 37.6 35.5* 33.2*  PLT 241 225 218  MCV 84.7 85.7 85.3  MCH 26.4 26.1 26.0  MCHC 31.1 30.4 30.4  RDW 16.6* 17.0* 17.1*    Recent Labs  Lab 06/01/21 0225  06/02/21 0150 06/04/21 0237 06/06/21 0829  NA 132* 135 132*  --   K 4.5 4.7 4.4  --   CL 96* 97* 97*  --   CO2 27 30 28   --   GLUCOSE 306* 258* 212*  --   BUN 13 11 15   --   CREATININE 1.02* 1.07* 1.05*  --   CALCIUM 9.1 9.1 8.5*  --   PROCALCITON  --   --   --  0.11      GFR: Estimated Creatinine Clearance: 83.9 mL/min (A) (by C-G formula based on SCr of 1.05 mg/dL (H)).   Sepsis Labs: Lactic Acid, Venous    Component Value Date/Time   LATICACIDVEN 2.1 (HH) 05/27/2021    RADIOLOGY STUDIES/RESULTS: No results found.   LOS: 10 days   Signature  05/29/2021 M.D on 06/06/2021 at 12:30 PM   -  To page go to [email or web address]

## 2021-06-06 NOTE — Progress Notes (Signed)
Inpatient Rehabilitation Admissions Coordinator   I spoke with patient by phone. I have received insurance approval for CIR, but no bed available today. I await bed availability over the next couple of days. Patient in agreement. Acute team and TOC made aware.  Ottie Glazier, RN, MSN Rehab Admissions Coordinator [phone] 06/06/2021 11:37 AM

## 2021-06-06 NOTE — Progress Notes (Signed)
Occupational Therapy Treatment Patient Details Name: [name] MRN: [id number] DOB: [birth year] Today's Date: 06/06/2021   History of present illness Pt is a 60 y.o. female admitted 05/26/21 with fever and RLE erythema; found to have sepsis due to RLE cellulitis/diabetic foot with Enterococcus bacteremia. 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 PMH includes asthma, CHF, CAD, HLD, HTN, DM2, obesity, connective tissue disease, chronic pain, R toe amputations (01/2020).   OT comments  Co-treat with PT to address transfers and standing at sink. Patient was able to transfer to recliner using RW and assistance of 2 for safety.  Patient performed grooming standing at sink with several attempts to perform while standing but was unable to stand unsupported. Patient was provided HEP and theraband and instructed on exercises.  Acute OT to continue to follow.    Recommendations for follow up therapy are one component of a multi-disciplinary discharge planning process, led by the attending physician.  Recommendations may be updated based on patient status, additional functional criteria and insurance authorization.    Follow Up Recommendations  CIR    Equipment Recommendations  Wheelchair (measurements OT);Wheelchair cushion (measurements OT);Tub/shower bench    Recommendations for Other Services      Precautions / Restrictions Precautions Precautions: Fall Precaution Comments: body habitus Required Braces or Orthoses: Other Brace Other Brace: limb protector Restrictions Weight Bearing Restrictions: Yes RLE Weight Bearing: Non weight bearing       Mobility Bed Mobility Overal bed mobility: Needs Assistance             General bed mobility comments: sitting EOB with RLE up on bed with knee extended    Transfers Overall transfer level: Needs assistance Equipment used: Rolling walker (2 wheeled) Transfers: Pharmacologist;Sit to/from Stand Sit to Stand: Min assist;+2  safety/equipment Stand pivot transfers: Min assist;+2 safety/equipment       General transfer comment: stood from EOB x 1; from recliner to sink x 3; see OT note re: ADLs at sink    Balance Overall balance assessment: Needs assistance Sitting-balance support: No upper extremity supported;Feet unsupported Sitting balance-[name]: Good Sitting balance - Comments: Difficulty reaching bilateral feet sitting EOB   Standing balance support: Bilateral upper extremity supported Standing balance-[name]: Poor Standing balance comment: bil UE support needed in standing; stood for max 30 seconds (limited by back pain)                           ADL either performed or assessed with clinical judgement   ADL Overall ADL's : Needs assistance/impaired     Grooming: Wash/dry hands;Wash/dry face;Supervision/safety;Sitting Grooming Details (indicate cue type and reason): attempted to perform standing at sink but was unable to keep balance and perform grooming.                               General ADL Comments: transfer performed to recliner with walker and assistance of 2     Vision       Perception     Praxis      Cognition Arousal/Alertness: Awake/alert Behavior During Therapy: WFL for tasks assessed/performed Overall Cognitive Status: Within Functional Limits for tasks assessed                                 General Comments: good participation  Exercises Exercises: Other exercises Amputee Exercises Quad Sets: AROM;Both;5 reps;Seated Hip ABduction/ADduction: AROM;Right;5 reps;Seated Knee Flexion: AROM;Right;10 reps;Seated Knee Extension: AROM;10 reps;Seated;Both Other Exercises Other Exercises: Cues for all exercises; educated on importance of full knee extension   Shoulder Instructions       General Comments      Pertinent Vitals/ Pain       Pain Assessment: Faces Faces Pain Scale: Hurts whole lot Pain Location:  back with standing Pain Descriptors / Indicators: Grimacing;Guarding;Discomfort;Aching Pain Intervention(s): Premedicated before session  Home Living                                          Prior Functioning/Environment              Frequency  Min 2X/week        Progress Toward Goals  OT Goals(current goals can now be found in the care plan section)  Progress towards OT goals: Progressing toward goals  Acute Rehab OT Goals Patient Stated Goal: I want to do more and get stronger OT Goal Formulation: With patient Time For Goal Achievement: 06/17/21 Potential to Achieve Goals: Good ADL Goals Pt Will Perform Grooming: with set-up;sitting Pt Will Perform Upper Body Bathing: with set-up;sitting Pt Will Perform Upper Body Dressing: with set-up;sitting Pt Will Transfer to Toilet: stand pivot transfer;bedside commode;with mod assist;with +2 assist Pt Will Perform Toileting - Clothing Manipulation and hygiene: with min assist;sitting/lateral leans Pt/caregiver will Perform Home Exercise Program: Increased strength;Both right and left upper extremity;With theraband;With Supervision;With written HEP provided  Plan Discharge plan remains appropriate    Co-evaluation    PT/OT/SLP Co-Evaluation/Treatment: Yes Reason for Co-Treatment: For patient/therapist safety;To address functional/ADL transfers PT goals addressed during session: Mobility/safety with mobility;Balance;Strengthening/ROM;Proper use of DME OT goals addressed during session: ADL's and self-care      AM-PAC OT "6 Clicks" Daily Activity     Outcome Measure   Help from another person eating meals?: None Help from another person taking care of personal grooming?: None Help from another person toileting, which includes using toliet, bedpan, or urinal?: A Lot Help from another person bathing (including washing, rinsing, drying)?: A Lot Help from another person to put on and taking off regular upper  body clothing?: A Little Help from another person to put on and taking off regular lower body clothing?: A Lot 6 Click Score: 17    End of Session Equipment Utilized During Treatment: Gait belt;Rolling walker  OT Visit Diagnosis: Unsteadiness on feet (R26.81);Muscle weakness (generalized) (M62.81);Pain Pain - Right/Left: Right Pain - part of body: Leg   Activity Tolerance Patient tolerated treatment well   Patient Left in chair;with call bell/phone within reach   Nurse Communication Other (comment) (on transfers)        Time: 1610-9604 OT Time Calculation (min): 26 min  Charges: OT General Charges $OT Visit: 1 Visit OT Treatments $Self Care/Home Management : 8-22 mins  Alfonse Flavors, OTA   [name] 06/06/2021, 3:12 PM

## 2021-06-07 ENCOUNTER — Encounter (HOSPITAL_BASED_OUTPATIENT_CLINIC_OR_DEPARTMENT_OTHER): Payer: 59 | Admitting: Internal Medicine

## 2021-06-07 DIAGNOSIS — B952 Enterococcus as the cause of diseases classified elsewhere: Secondary | ICD-10-CM | POA: Diagnosis not present

## 2021-06-07 DIAGNOSIS — R7881 Bacteremia: Secondary | ICD-10-CM | POA: Diagnosis not present

## 2021-06-07 LAB — BASIC METABOLIC PANEL
Anion gap: 9 (ref 5–15)
BUN: 14 mg/dL (ref 6–20)
CO2: 27 mmol/L (ref 22–32)
Calcium: 8.6 mg/dL — ABNORMAL LOW (ref 8.9–10.3)
Chloride: 100 mmol/L (ref 98–111)
Creatinine, Ser: 0.96 mg/dL (ref 0.44–1.00)
GFR, Estimated: >60 mL/min (ref >60)
Glucose, Bld: 129 mg/dL — ABNORMAL HIGH (ref 70–99)
Potassium: 4.2 mmol/L (ref 3.5–5.1)
Sodium: 136 mmol/L (ref 135–145)

## 2021-06-07 LAB — CBC
HCT: 32.7 % — ABNORMAL LOW (ref 36.0–46.0)
Hemoglobin: 9.7 g/dL — ABNORMAL LOW (ref 12.0–15.0)
MCH: 25.6 pg — ABNORMAL LOW (ref 26.0–34.0)
MCHC: 29.7 g/dL — ABNORMAL LOW (ref 30.0–36.0)
MCV: 86.3 fL (ref 80.0–100.0)
Platelets: 297 10*3/uL (ref 150–400)
RBC: 3.79 MIL/uL — ABNORMAL LOW (ref 3.87–5.11)
RDW: 16.9 % — ABNORMAL HIGH (ref 11.5–15.5)
WBC: 7.1 10*3/uL (ref 4.0–10.5)
nRBC: 0 % (ref 0.0–0.2)

## 2021-06-07 LAB — GLUCOSE, CAPILLARY
Glucose-Capillary: 158 mg/dL — ABNORMAL HIGH (ref 70–99)
Glucose-Capillary: 176 mg/dL — ABNORMAL HIGH (ref 70–99)
Glucose-Capillary: 164 mg/dL — ABNORMAL HIGH (ref 70–99)
Glucose-Capillary: 194 mg/dL — ABNORMAL HIGH (ref 70–99)

## 2021-06-07 LAB — PROCALCITONIN: Procalcitonin: 0.11 ng/mL

## 2021-06-07 MED ORDER — HYDROMORPHONE HCL 2 MG PO TABS
1.0000 mg | ORAL_TABLET | ORAL | Status: DC | PRN
  Administered 2021-06-07 – 2021-06-08 (×3): 1 mg via ORAL
  Filled 2021-06-07 (×3): qty 1

## 2021-06-07 NOTE — Progress Notes (Signed)
Physical Therapy Treatment Patient Details Name: [name] MRN: [id number] DOB: [birth year] Today's Date: 06/07/2021   History of Present Illness Pt is a 60 y.o. female admitted 05/26/21 with fever and RLE erythema; found to have sepsis due to RLE cellulitis/diabetic foot with Enterococcus bacteremia. 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 PMH includes asthma, CHF, CAD, HLD, HTN, DM2, obesity, connective tissue disease, chronic pain, R toe amputations (01/2020).    PT Comments    Initiated session with reviewing HEP issued 9/26 (both LEs and UEs). After exercises, attempted sit to stand from EOB with pt reporting dizziness and unable to clear buttocks off the mattress. Pt frustrated and attempted x3 without ability to fully stand. Reports she pivoted to/from [facility] with RW with +1 assist earlier today. ? Exercises fatigued her, making transfer more difficult. Pt remained at EOB at end of session.     Recommendations for follow up therapy are one component of a multi-disciplinary discharge planning process, led by the attending physician.  Recommendations may be updated based on patient status, additional functional criteria and insurance authorization.  Follow Up Recommendations  CIR     Equipment Recommendations  Other (comment) (drop-arm BSC, slide board)    Recommendations for Other Services OT consult     Precautions / Restrictions Precautions Precautions: Fall Required Braces or Orthoses: Other Brace Other Brace: limb protector     Mobility  Bed Mobility Overal bed mobility: Needs Assistance Bed Mobility: Supine to Sit;Rolling;Sit to Supine Rolling: Modified independent (Device/Increase time)   Supine to sit: HOB elevated;Min guard Sit to supine: Supervision   General bed mobility comments: sitting EOB with RLE up on bed with knee extended on arrival; to supine and rolling for bed exercises for LEs; return to sit EOB    Transfers Overall transfer level: Needs  assistance Equipment used: Rolling walker (2 wheeled) Transfers: Sit to/from Stand Sit to Stand: Min assist         General transfer comment: attempted sit to stand x 3 from EOB with 1 person assist; pt unable to come to full stand with reports of dizziness and feeling fearful. Reports she got to/from [facility] this morning with 1 nurse and not sure why she cannot stand up at this time (this was attempted after exercises x 4 extremities)  Ambulation/Gait                 Stairs             Wheelchair Mobility    Modified Rankin (Stroke Patients Only)       Balance Overall balance assessment: Needs assistance Sitting-balance support: No upper extremity supported;Feet unsupported Sitting balance-[name]: Good Sitting balance - Comments: Difficulty reaching bilateral feet sitting EOB                                    Cognition Arousal/Alertness: Awake/alert Behavior During Therapy: WFL for tasks assessed/performed Overall Cognitive Status: Within Functional Limits for tasks assessed                                        Exercises Amputee Exercises Quad Sets: AROM;Both;10 reps;Supine Hip ABduction/ADduction: Right;Sidelying;Left;Supine;10 reps (left in supine to decrease back pain) Knee Flexion: AROM;Right;10 reps;Seated Knee Extension: AROM;10 reps;Seated;Both Other Exercises Other Exercises: UE theraband exercises to clarify technique (handout given by OT  9/26)--scapular adduction, shoulder flexion, elbow flexion, elbow extension x 10 reps each arm with min cues for technique/how to hold band    General Comments        Pertinent Vitals/Pain Pain Assessment: Faces Faces Pain Scale: Hurts little more Pain Location: back Pain Descriptors / Indicators: Grimacing;Guarding;Discomfort;Aching Pain Intervention(s): Limited activity within patient's tolerance;Premedicated before session;Monitored during session;Repositioned     Home Living                      Prior Function            PT Goals (current goals can now be found in the care plan section) Acute Rehab PT Goals Patient Stated Goal: I want to do more and get stronger PT Goal Formulation: With patient/family Time For Goal Achievement: 06/16/21 Potential to Achieve Goals: Good Progress towards PT goals: Progressing toward goals    Frequency    Min 4X/week      PT Plan Current plan remains appropriate    Co-evaluation              AM-PAC PT "6 Clicks" Mobility   Outcome Measure  Help needed turning from your back to your side while in a flat bed without using bedrails?: A Little Help needed moving from lying on your back to sitting on the side of a flat bed without using bedrails?: A Little Help needed moving to and from a bed to a chair (including a wheelchair)?: Total Help needed standing up from a chair using your arms (e.g., wheelchair or bedside chair)?: Total Help needed to walk in hospital room?: Total Help needed climbing 3-5 steps with a railing? : Total 6 Click Score: 10    End of Session   Activity Tolerance: Patient limited by fatigue;Treatment limited secondary to medical complications (Comment) (dizziness) Patient left: with call bell/phone within reach;in bed   PT Visit Diagnosis: Other abnormalities of gait and mobility (R26.89);Muscle weakness (generalized) (M62.81);Pain Pain - Right/Left: Right Pain - part of body: Leg     Time: 4196-2229 PT Time Calculation (min) (ACUTE ONLY): 31 min  Charges:  $Therapeutic Exercise: 23-37 mins                      Jerolyn Center, PT Pager [phone]    Zena Amos 06/07/2021, 12:04 PM

## 2021-06-07 NOTE — Progress Notes (Signed)
Inpatient Rehabilitation Admissions Coordinator   I met with patient at bedside to let her know I await bed availability to admit her to CIR and reviewed cost of care of CIR with her insurance coverage. I also discussed the need to transition off IV pain meds before admit. I will follow up tomorrow.  Danne Baxter, RN, MSN Rehab Admissions Coordinator [phone] 06/07/2021 12:30 PM

## 2021-06-07 NOTE — Progress Notes (Signed)
PROGRESS NOTE        PATIENT DETAILS Name: [name] Age: 60 y.o. Sex: female Date of Birth: 08-08-1961 Admit Date: 05/26/2021 Admitting Physician Eduard Clos, MD IRS:[email or web address], Jilda Roche, DO  Brief Narrative: Patient is a 60 y.o. female with history of DM-2, CAD, HTN, MCTD, chronic right foot ulcer (refused BKA in the past as wanted to try hyperbaric therapy)-presenting with fever/RLE erythema-found to have sepsis and Enterococcus bacteremia.  Subjective:  Patient in bed, appears comfortable, denies any headache, no fever, no chest pain or pressure, no shortness of breath , no abdominal pain. No new focal weakness. +ve chronic Low back pain.     Objective: Vitals: Blood pressure 137/72, pulse 80, temperature 98.4 F (36.9 C), temperature source Oral, resp. rate 18, height 5\' 2"  (1.575 m), weight (!) 155 kg, SpO2 95 %.   Exam:  Awake Alert, No new F.N deficits, Normal affect [place].AT,PERRAL Supple Neck,No JVD, No cervical lymphadenopathy appriciated.  Symmetrical Chest wall movement, Good air movement bilaterally, CTAB RRR,No Gallops, Rubs or new Murmurs, No Parasternal Heave +ve B.Sounds, Abd Soft, No tenderness, No organomegaly appriciated, No rebound - guarding or rigidity. R BKA stump under bandage/wrap.   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  Intraoperative TEE on 9/21 without any vegetations.  Antibiotics narrowed to ampicillin-stop date 06/08/21.  Continues to have significant right BKA stump pain/back pain-remains on oxycodone/Dilaudid.  Already on Neurontin-any further increase in dosage-makes patient drowsy/hallucinate (per patient history), made Flexeril scheduled for back spasms.  IBS diarrhea predominant: Has not had a BM in several days-suspect she is  developing constipation due to narcotics-she is already on MiraLAX/senna-we will ask RN to try Dulcolax suppository today.  Reassess on 9/25.  HTN: BP stable-continue Imdur, Avapro and Cardizem.  Follow and adjust.    Chronic pain syndrome: At baseline-continue as needed oxycodone and Neurontin.  Outpatient neurosurgery follow-up post discharge.  CAD-s/p RCA PCI on 2019: No anginal symptoms-continue ASA  HLD: Continue Zetia  MCTD: Continue Plaquenil  Hypothyroidism: Continue Synthroid  GERD: Continue PPI  Restless leg syndrome: Continue Mirapex  Bronchial asthma: Not in exacerbation-continue bronchodilators  Morbid Obesity: BMI 60, follow with PCP for weight loss, needs outpt Sleep study as well.  DM-2 (A1c 7.3 on 9/16): CBGs remain on the higher side-on [place] R 500 sliding scale as outpatient-increased Lantus to 56 units, increase Premeal NovoLog to 22 units-continue SSI-reassess on 9/25.    CBG (last 3)  Recent Labs    06/06/21 1639 06/06/21 2152 06/07/21 0742  GLUCAP 114* 124* 158*      Procedures :   R. BKA 06/01/21  Radiology/Echo: 9/15>> CT right foot: Chronic osteomyelitis at third metatarsal fracture site-soft tissue swelling consistent with cellulitis. 9/18>> TTE: EF 55-60%, no obvious vegetation 9/21>> TEE: No vegetations  Consults: DVT Prophylaxis: Lovenox Code Status:Full code  Family Communication: None at bedside.  Time spent: 25 minutes-Greater than 50% of this time was spent in counseling, explanation of diagnosis, planning of further management, and coordination of care.  Diet: Diet Order             Diet heart healthy/carb modified Room service appropriate? Yes; Fluid consistency: Thin  Diet effective now  Disposition Plan: Status is: Inpatient  Remains inpatient appropriate because:Inpatient level of care appropriate due to severity of illness  Dispo: The patient is from: Home              Anticipated d/c is  to: Home              Patient currently is not medically stable to d/c.   Difficult to place patient No   Barriers to Discharge: Enterococcal bacteremia-s/p BKA on 9/21-await PT/OT to determine safe disposition.    Antimicrobial agents: Anti-infectives (From admission, onward)    Start     Dose/Rate Route Frequency Ordered Stop   06/02/21 1030  ampicillin (OMNIPEN) 2 g in sodium chloride 0.9 % 100 mL IVPB        2 g 300 mL/hr over 20 Minutes Intravenous Every 4 hours 06/02/21 0920 06/09/21 0359   05/28/21 1630  Ampicillin-Sulbactam (UNASYN) 3 g in sodium chloride 0.9 % 100 mL IVPB  Status:  Discontinued        3 g 200 mL/hr over 30 Minutes Intravenous Every 6 hours 05/28/21 0946 06/02/21 0920   05/27/21 2200  vancomycin (VANCOREADY) IVPB 1750 mg/350 mL  Status:  Discontinued        1,750 mg 175 mL/hr over 120 Minutes Intravenous Every 24 hours 05/27/21 0759 05/28/21 0849   05/27/21 2200  hydroxychloroquine (PLAQUENIL) tablet 200 mg        200 mg Oral 2 times daily 05/27/21 2004     05/27/21 1800  vancomycin (VANCOREADY) IVPB 1500 mg/300 mL  Status:  Discontinued        1,500 mg 150 mL/hr over 120 Minutes Intravenous Every 24 hours 05/26/21 1812 05/27/21 0757   05/27/21 0800  ceFEPIme (MAXIPIME) 2 g in sodium chloride 0.9 % 100 mL IVPB  Status:  Discontinued        2 g 200 mL/hr over 30 Minutes Intravenous Every 8 hours 05/27/21 0749 05/28/21 0849   05/27/21 0800  vancomycin (VANCOCIN) IVPB 1000 mg/200 mL premix        1,000 mg 200 mL/hr over 60 Minutes Intravenous  Once 05/27/21 0757 05/27/21 0948   05/26/21 2200  ceFEPIme (MAXIPIME) 2 g in sodium chloride 0.9 % 100 mL IVPB  Status:  Discontinued        2 g 200 mL/hr over 30 Minutes Intravenous Every 8 hours 05/26/21 1812 05/27/21 0749   05/26/21 1815  vancomycin (VANCOCIN) IVPB 1000 mg/200 mL premix        1,000 mg 200 mL/hr over 60 Minutes Intravenous Every hour 05/26/21 1812 05/26/21 2014   05/26/21 1745  vancomycin (VANCOCIN)  2,500 mg in sodium chloride 0.9 % 500 mL IVPB  Status:  Discontinued        2,500 mg 250 mL/hr over 120 Minutes Intravenous  Once 05/26/21 1741 05/26/21 1812        MEDICATIONS: Scheduled Meds:  acetaminophen  1,000 mg Oral Q8H   aspirin EC  81 mg Oral Daily   cyclobenzaprine  10 mg Oral TID   diltiazem  240 mg Oral Daily   enoxaparin (LOVENOX) injection  0.5 mg/kg Subcutaneous Daily   ezetimibe  10 mg Oral Daily   feeding supplement (GLUCERNA SHAKE)  237 mL Oral TID BM   gabapentin  400 mg Oral BID WC   gabapentin  800 mg Oral QHS   hydroxychloroquine  200 mg Oral BID   insulin aspart  0-20 Units Subcutaneous TID WC   insulin aspart  0-5 Units Subcutaneous QHS   insulin aspart  22 Units Subcutaneous TID WC   insulin glargine-yfgn  56 Units Subcutaneous Daily   irbesartan  150 mg Oral Daily   isosorbide mononitrate  30 mg Oral Daily   levothyroxine  200 mcg Oral QAC breakfast   loratadine  10 mg Oral QHS   multivitamin with minerals  1 tablet Oral Daily   mupirocin ointment   Topical TID   polyethylene glycol  17 g Oral BID   pramipexole  1 mg Oral BID   saccharomyces boulardii  250 mg Oral BID   senna  2 tablet Oral QHS   Vitamin D (Ergocalciferol)  50,000 Units Oral Q7 days   Continuous Infusions:  ampicillin (OMNIPEN) IV 2 g (06/07/21 1004)   PRN Meds:.albuterol, bisacodyl, fluticasone, hydrocortisone cream, HYDROmorphone, magnesium oxide, nitroGLYCERIN, oxyCODONE, pantoprazole, phenol, pramipexole, promethazine   I have personally reviewed following labs and imaging studies  LABORATORY DATA:  Recent Labs  Lab 06/01/21 0225 06/02/21 0150 06/04/21 0237 06/07/21 0303  WBC 7.5 11.5* 7.9 7.1  HGB 11.7* 10.8* 10.1* 9.7*  HCT 37.6 35.5* 33.2* 32.7*  PLT 241 225 218 297  MCV 84.7 85.7 85.3 86.3  MCH 26.4 26.1 26.0 25.6*  MCHC 31.1 30.4 30.4 29.7*  RDW 16.6* 17.0* 17.1* 16.9*    Recent Labs  Lab 06/01/21 0225 06/02/21 0150 06/04/21 0237 06/06/21 0829  06/07/21 0303  NA 132* 135 132*  --  136  K 4.5 4.7 4.4  --  4.2  CL 96* 97* 97*  --  100  CO2 27 30 28   --  27  GLUCOSE 306* 258* 212*  --  129*  BUN 13 11 15   --  14  CREATININE 1.02* 1.07* 1.05*  --  0.96  CALCIUM 9.1 9.1 8.5*  --  8.6*  PROCALCITON  --   --   --  0.11 0.11      GFR: Estimated Creatinine Clearance: 91.7 mL/min (by C-G formula based on SCr of 0.96 mg/dL).   Sepsis Labs: Lactic Acid, Venous    Component Value Date/Time   LATICACIDVEN 2.1 (HH) 05/27/2021    RADIOLOGY STUDIES/RESULTS: No results found.   LOS: 11 days   Signature  05/29/2021 M.D on 06/07/2021 at 10:45 AM   -  To page go to [email or web address]

## 2021-06-07 NOTE — Progress Notes (Signed)
Nutrition Follow-up  DOCUMENTATION CODES:   Morbid obesity  INTERVENTION:   -Continue Magic cup TID with meals, each supplement provides 290 kcal and 9 grams of protein  -Continue double protein portions with meals -Continue MVI with minerals daily -D/c Glucerna shake   NUTRITION DIAGNOSIS:   Increased nutrient needs related to wound healing as evidenced by estimated needs.  Ongoing  GOAL:   Patient will meet greater than or equal to 90% of their needs  Progressing   MONITOR:   PO intake, Supplement acceptance, Labs, Weight trends, Skin, I & O's  REASON FOR ASSESSMENT:   Consult Assessment of nutrition requirement/status  ASSESSMENT:   Patient is a 60 y.o. female with history of DM-2, CAD, HTN, MCTD, chronic right foot ulcer (refused BKA in the past as wanted to try hyperbaric therapy)-presenting with fever/RLE erythema-found to have sepsis and Enterococcus bacteremia.  9/21- s/p Transtibial amputation Application of Prevena wound VAC  Reviewed I/O's: +120 ml x 24 hours and +9.3 L since admission  Pt remains with good appetite. Noted meal completions 50-100%. Glucerna supplements have been ordered, with variable acceptance.   Per CIR admissions notes, pt is medically stable for discharge and awaiting bed.   Medications reviewed and include cardizem, miralax, florastor, senokot, and vitamin D.   Labs reviewed: CBGS: 114-176 (inpatient orders for glycemic control are 0-20 units insulin aspart TID with meals, 0-5 units insulin aspart daily at bedtime, 22 units insulin aspart TID with meal,s and 56 units insulin glargine-yfgn daily).    Diet Order:   Diet Order             Diet heart healthy/carb modified Room service appropriate? Yes; Fluid consistency: Thin  Diet effective now                   EDUCATION NEEDS:   No education needs have been identified at this time  Skin:  Skin Assessment: Skin Integrity Issues: Skin Integrity Issues:: Wound  VAC Wound Vac: s/p rt BKA Diabetic Ulcer: n/a- s/p rt BKA  Last BM:  06/06/21  Height:   Ht Readings from Last 1 Encounters:  05/27/21 5\' 2"  (1.575 m)    Weight:   Wt Readings from Last 1 Encounters:  06/06/21 (!) 155 kg    Ideal Body Weight:  46.8 kg (adjustef for rt BKA)  BMI:  Body mass index is 62.5 kg/m.  Estimated Nutritional Needs:   Kcal:  1800-2000  Protein:  110-125 grams  Fluid:  > 1.8 L    06/08/21, RD, LDN, CDCES Registered Dietitian II Certified Diabetes Care and Education Specialist Please refer to [facility] for RD and/or RD on-call/weekend/after hours pager

## 2021-06-07 NOTE — PMR Pre-admission (Signed)
PMR Admission Coordinator Pre-Admission Assessment  Patient: [name] is an 60 y.o., female MRN: [id number] DOB: [birth year] Height: _0  (157.5 cm) Weight: (!) 155 kg  Insurance Information HMO:     PPO:      PCP:      IPA:      80/20:      OTHER:  PRIMARY: [place]      Policy#: [id number]      Subscriber: pt CM Name: Hilda Blades      Phone#: [phone]     Fax#: [phone] Pre-Cert#: [id number] for 7 days      Employer:  Benefits:  Phone #: [phone]     Name: 9/23 Eff. Date: 09/12/2019     Deduct: none      Out of Pocket Max: $8700      Life Max: none CIR: $3000 co pay per day days for 2 days      SNF: 50% Outpatient: $100 per visit     Co-Pay: visits per medical neccesity Home Health: 50%      Co-Pay: visits per medical neccesity DME: 50%     Co-Pay: DME rental only Providers: in network  SECONDARY: none        Financial Counselor:       Phone#:   [employer]" for patients in Inpatient Rehabilitation Facilities with attached "Privacy Act [place] Records" was provided and verbally reviewed with: N/A  Emergency Contact Information Contact Information     Name Relation Home Work Mobile   Anne,Mike Spouse [phone]  [phone]   Morton Peters. Roxan Diesel   [phone]       Current Medical History  Patient Admitting Diagnosis: BKA  History of Present Illness: 60 year old female with medical history of chronic pain, type 2 DM, CAD s/p PTCA, HLD, mixed connective tissue disease, hypothyroidism, asthma, GERD, and chronic right foot ulcer. Presented on 05/26/2021 with fever to 104 and concerns for sepsis due to foot infection.   Found to have sepsis and enterococcus bacteremia from RLE cellulitis/superimposed on chronic diabetic foot ulcer with underlying osteomyelitis. 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 Intraoperative TEE without any vegetation. Antibiotics narrowed to ampicillin with stop date of 06/08/21. Postoperative pain with  medications adjusted. IBS diarrhea pta and developing some constipation due to narcotics. BP stable on Imdur, Avapro and Cardizem.   Patient's medical record from [facility] has been reviewed by the rehabilitation admission coordinator and physician.  Past Medical History  Past Medical History:  Diagnosis Date   Aortic stenosis    mild AS by echo 02/2021   Asthma    "on daily RX and rescue inhaler" (04/18/2018)   Chronic diastolic CHF (congestive heart failure) ([place]) 09/2015   Chronic lower back pain    Chronic neck pain    Chronic pain syndrome    Fentanyl Patch   Colon polyps    Coronary artery disease    cath with normal LM, 30% LAD, 85% mid RCA and 95% distal RCA s/p PCI of the mid to distal RCA and now on DAPT with ASA and Ticagrelor.     Eczema    Excessive daytime sleepiness 11/26/2015   Fibromyalgia    Gallstones    GERD (gastroesophageal reflux disease)    Heart murmur    "noted for the 1st time on 04/18/2018"   History of blood transfusion 07/2010   "S/P oophorectomy"   History of gout    History of hiatal hernia 1980s   "  gone now" (04/18/2018)   Hyperlipidemia    Hypertension    takes Metoprolol and Enalapril daily   Hypothyroidism    takes Synthroid daily   IBS (irritable bowel syndrome)    Migraine    "nothing in the 2000s" (04/18/2018)   Mixed connective tissue disease ([place])    NAFLD (nonalcoholic fatty liver disease)    Pneumonia    "several times" (04/18/2018)   PVC's (premature ventricular contractions)    noted on event monitor 02/2021   Rheumatoid arthritis ([place])    "hands, elbows, shoulders, probably knees" (04/18/2018)   Scoliosis    Spondylosis    Type II diabetes mellitus ([place])    takes Metformin and Hum R daily (04/18/2018)    Has the patient had major surgery during 100 days prior to admission? Yes  Family History   family history includes Allergic rhinitis in her father; Asthma in her father and son; Breast cancer in her paternal aunt; CAD in  her father and mother; Heart attack in her father and mother; Hypertension in her brother, brother, and mother; Lung cancer in her paternal aunt; Pancreatic cancer in her paternal aunt.  Current Medications  Current Facility-Administered Medications:    acetaminophen (TYLENOL) tablet 1,000 mg, 1,000 mg, Oral, Q8H, 1,000 mg at 06/08/21 0502 **OR** [DISCONTINUED] acetaminophen (TYLENOL) suppository 650 mg, 650 mg, Rectal, Q6H PRN, Orma Flaming, MD   albuterol (PROVENTIL) (2.5 MG/3ML) 0.083% nebulizer solution 2.5 mg, 2.5 mg, Nebulization, Q4H PRN, Persons, Bevely Palmer, PA, 2.5 mg at 06/06/21 0729   ampicillin (OMNIPEN) 2 g in sodium chloride 0.9 % 100 mL IVPB, 2 g, Intravenous, Q4H, [name] [place], DO, Last Rate: 300 mL/hr at 06/08/21 0843, 2 g at 06/08/21 0843   aspirin EC tablet 81 mg, 81 mg, Oral, Daily, Persons, Bevely Palmer, PA, 81 mg at 06/08/21 1023   bisacodyl (DULCOLAX) suppository 10 mg, 10 mg, Rectal, Daily PRN, Ghimire, Henreitta Leber, MD   cyclobenzaprine (FLEXERIL) tablet 10 mg, 10 mg, Oral, TID, Thurnell Lose, MD, 10 mg at 06/08/21 1023   diltiazem (CARDIZEM CD) 24 hr capsule 240 mg, 240 mg, Oral, Daily, Persons, Bevely Palmer, PA, 240 mg at 06/08/21 1024   enoxaparin (LOVENOX) injection 75 mg, 0.5 mg/kg, Subcutaneous, Daily, Persons, Bevely Palmer, PA, 75 mg at 06/08/21 1022   ezetimibe (ZETIA) tablet 10 mg, 10 mg, Oral, Daily, Persons, Bevely Palmer, PA, 10 mg at 06/08/21 1024   fluticasone (FLONASE) 50 MCG/ACT nasal spray 2 spray, 2 spray, Each Nare, Daily PRN, Persons, Bevely Palmer, PA   gabapentin (NEURONTIN) capsule 400 mg, 400 mg, Oral, BID WC, Persons, Bevely Palmer, PA, 400 mg at 06/08/21 9242   gabapentin (NEURONTIN) capsule 800 mg, 800 mg, Oral, QHS, Persons, Bevely Palmer, PA, 800 mg at 06/07/21 2232   hydrocortisone cream 1 % 1 application, 1 application, Topical, TID PRN, Jonetta Osgood, MD, 1 application at 68/34/19 2223   HYDROmorphone (DILAUDID) tablet 1 mg, 1 mg, Oral, Q4H PRN, Thurnell Lose, MD, 1 mg at 06/07/21 2022   hydroxychloroquine (PLAQUENIL) tablet 200 mg, 200 mg, Oral, BID, Persons, Bevely Palmer, PA, 200 mg at 06/08/21 1024   insulin aspart (novoLOG) injection 0-20 Units, 0-20 Units, Subcutaneous, TID WC, Persons, Bevely Palmer, PA, 4 Units at 06/08/21 0839   insulin aspart (novoLOG) injection 0-5 Units, 0-5 Units, Subcutaneous, QHS, Persons, Bevely Palmer, [place], 2 Units at 06/05/21 2219   insulin aspart (novoLOG) injection 22 Units, 22 Units, Subcutaneous, TID WC, Ghimire, Henreitta Leber, MD, 22  Units at 06/08/21 0839   insulin glargine-yfgn (SEMGLEE) injection 56 Units, 56 Units, Subcutaneous, Daily, Ghimire, Henreitta Leber, MD, 56 Units at 06/08/21 1022   irbesartan (AVAPRO) tablet 150 mg, 150 mg, Oral, Daily, Persons, Bevely Palmer, PA, 150 mg at 06/08/21 1024   isosorbide mononitrate (IMDUR) 24 hr tablet 30 mg, 30 mg, Oral, Daily, Persons, Bevely Palmer, PA, 30 mg at 06/08/21 1023   levothyroxine (SYNTHROID) tablet 200 mcg, 200 mcg, Oral, QAC breakfast, Persons, Bevely Palmer, PA, 200 mcg at 06/08/21 0502   loratadine (CLARITIN) tablet 10 mg, 10 mg, Oral, QHS, Persons, Bevely Palmer, PA, 10 mg at 06/07/21 2232   magnesium oxide (MAG-OX) tablet 400 mg, 400 mg, Oral, BID PRN, Persons, Bevely Palmer, PA   multivitamin with minerals tablet 1 tablet, 1 tablet, Oral, Daily, Persons, Bevely Palmer, PA, 1 tablet at 06/08/21 1024   mupirocin ointment (BACTROBAN) 2 %, , Topical, TID, Persons, Bevely Palmer, [place], Given at 06/08/21 1026   nitroGLYCERIN (NITROSTAT) SL tablet 0.4 mg, 0.4 mg, Sublingual, Q5 min PRN, Persons, Bevely Palmer, PA   oxyCODONE (Oxy IR/ROXICODONE) immediate release tablet 20 mg, 20 mg, Oral, Q6H PRN, Jonetta Osgood, MD, 20 mg at 06/08/21 1023   pantoprazole (PROTONIX) EC tablet 40 mg, 40 mg, Oral, Daily PRN, Persons, Bevely Palmer, PA   phenol (CHLORASEPTIC) mouth spray 1 spray, 1 spray, Mouth/Throat, PRN, Ghimire, Henreitta Leber, MD, 1 spray at 06/01/21 1940   polyethylene glycol (MIRALAX / GLYCOLAX) packet  17 g, 17 g, Oral, BID, Ghimire, Shanker M, MD, 17 g at 06/06/21 1041   pramipexole (MIRAPEX) tablet 0.5 mg, 0.5 mg, Oral, Q4H PRN, Persons, Bevely Palmer, PA, 0.5 mg at 06/05/21 2218   pramipexole (MIRAPEX) tablet 1 mg, 1 mg, Oral, BID, Persons, Bevely Palmer, PA, 1 mg at 06/08/21 1025   promethazine (PHENERGAN) tablet 25 mg, 25 mg, Oral, Q8H PRN, Persons, Bevely Palmer, PA, 25 mg at 06/04/21 2031   saccharomyces boulardii (FLORASTOR) capsule 250 mg, 250 mg, Oral, BID, Persons, Bevely Palmer, PA, 250 mg at 06/08/21 1023   senna (SENOKOT) tablet 17.2 mg, 2 tablet, Oral, QHS, Ghimire, Henreitta Leber, MD, 17.2 mg at 06/04/21 2123   Vitamin D (Ergocalciferol) (DRISDOL) capsule 50,000 Units, 50,000 Units, Oral, Q7 days, Persons, Bevely Palmer, [place], 50,000 Units at 06/03/21 2232  Patients Current Diet:  Diet Order             Diet heart healthy/carb modified Room service appropriate? Yes; Fluid consistency: Thin  Diet effective now                  Precautions / Restrictions Precautions Precautions: Fall Precaution Comments: body habitus Other Brace: limb protector Restrictions Weight Bearing Restrictions: Yes RLE Weight Bearing: Non weight bearing   Has the patient had 2 or more falls or a fall with injury in the past year? No  Prior Activity Level Limited Community (1-2x/wk): Independent household  Prior Functional Level Self Care: Did the patient need help bathing, dressing, using the toilet or eating? Independent  Indoor Mobility: Did the patient need assistance with walking from room to room (with or without device)? Independent  Stairs: Did the patient need assistance with internal or external stairs (with or without device)? Independent  Functional Cognition: Did the patient need help planning regular tasks such as shopping or remembering to take medications? Independent  Patient Information Are you of Hispanic, Latino/a,or Spanish origin?: A. No, not of Hispanic, Latino/a, or Spanish origin What  is your race?: A.  White Do you need or want an interpreter to communicate with a doctor or health care staff?: 0. No  Patient's Response To:  Health Literacy and Transportation Is the patient able to respond to health literacy and transportation needs?: Yes Health Literacy - How often do you need to have someone help you when you read instructions, pamphlets, or other written material from your doctor or pharmacy?: Never In the past 12 months, has lack of transportation kept you from medical appointments or from getting medications?: No In the past 12 months, has lack of transportation kept you from meetings, work, or from getting things needed for daily living?: No  Home Assistive Devices / [place] Devices/Equipment: Environmental consultant (specify type), CBG Meter, Blood pressure cuff, Shower chair without back, Eyeglasses, Bedside commode/3-in-1 Home Equipment: Tub bench, Bedside commode, Walker - 4 wheels, Walker - 2 wheels, Wheelchair - manual  Prior Device Use: Indicate devices/aids used by the patient prior to current illness, exacerbation or injury? None in household  Current Functional Level Cognition  Overall Cognitive Status: Impaired/Different from baseline Orientation Level: Oriented X4 Safety/Judgement: Decreased awareness of safety, Decreased awareness of deficits General Comments: pt reports fell last night getting off BSC with husband's assist; reports full fall to the floor    Extremity Assessment (includes Sensation/Coordination)  Upper Extremity Assessment: Defer to OT evaluation  Lower Extremity Assessment: Generalized weakness, RLE deficits/detail RLE Deficits / Details: BKA    ADLs  Overall ADL's : Needs assistance/impaired Eating/Feeding: Independent, Bed level Grooming: Wash/dry hands, Wash/dry face, Supervision/safety, Sitting Grooming Details (indicate cue type and reason): attempted to perform standing at sink but was unable to keep balance and perform  grooming. Upper Body Bathing: Minimal assistance, Bed level Lower Body Bathing: Maximal assistance, Bed level Upper Body Dressing : Minimal assistance, Bed level Lower Body Dressing: Maximal assistance, Bed level General ADL Comments: transfer performed to recliner with walker and assistance of 2    Mobility  Overal bed mobility: Needs Assistance Bed Mobility: Supine to Sit, Rolling, Sit to Supine Rolling: Modified independent (Device/Increase time) Supine to sit: HOB elevated, Min guard Sit to supine: Supervision General bed mobility comments: reiterated need to keep RLE elevated and knee extended if sitting EOB (pt prefers not to wear limb protector if in bed and not transferring)    Transfers  Overall transfer level: Needs assistance Equipment used: Rolling walker (2 wheeled) Transfers: Sit to/from Stand Sit to Stand: Min assist, +2 physical assistance, +2 safety/equipment Stand pivot transfers: Min assist, +2 safety/equipment  Lateral/Scoot Transfers: +2 safety/equipment, Mod assist General transfer comment: pt on BSC on arrival; required 2 person assist to stand from [facility], recliner and bed (x4 total)    Ambulation / Gait / Stairs / Wheelchair Mobility  Ambulation/Gait Ambulation/Gait assistance: Counsellor (Feet): 2 Feet Assistive device: Rolling walker (2 wheeled) General Gait Details: several hops and then shimmied on her left foot to cover ~1 foot to and then from recliner; again shimmied x 1 ft toward [facility] Wheelchair Mobility Wheelchair mobility:  (discussed that she will need to learn to use wheelchair as do not think she will be able to ambulate functional distances with LLE weakness and sciatica)    Posture / Balance Dynamic Sitting Balance Sitting balance - Comments: Difficulty reaching bilateral feet sitting EOB Balance Overall balance assessment: Needs assistance Sitting-balance support: No upper extremity supported, Feet unsupported Sitting  balance-[name]: Good Sitting balance - Comments: Difficulty reaching bilateral feet sitting EOB Standing balance support: Bilateral upper  extremity supported Standing balance-[name]: Poor Standing balance comment: bil UE support needed in standing; stood for max 30 seconds (limited by back pain)    Special needs/care consideration Wound VAC to surgical site Hgb A1c 7.3   Previous Home Environment  Living Arrangements: Spouse/significant other  Lives With: Spouse Available Help at Discharge: Family, Available 24 hours/day Type of Home: House Home Layout: Two level, Able to live on main level with bedroom/bathroom Home Access: Ramped entrance Bathroom Shower/Tub: Chiropodist: Handicapped height Bathroom Accessibility: Yes How Accessible: Accessible via walker [place]: No Additional Comments: Uses BSC and places it near the toilet.  Discharge Living Setting Plans for Discharge Living Setting: Patient's home, Lives with (comment) (spouse) Type of Home at Discharge: House Discharge Home Layout: Two level, Able to live on main level with bedroom/bathroom Discharge Home Access: Ramped entrance Discharge Bathroom Shower/Tub: Tub/shower unit Discharge Bathroom Toilet: Handicapped height Discharge Bathroom Accessibility: Yes How Accessible: Accessible via walker Does the patient have any problems obtaining your medications?: Yes (Describe)  Social/Family/Support Systems Patient Roles: Spouse Contact Information: spouse, [name] Anticipated Caregiver: spouse Anticipated Ambulance person Information: see above Ability/Limitations of Caregiver: none Caregiver Availability: 24/7 Discharge Plan Discussed with Primary Caregiver: Yes Is Caregiver In Agreement with Plan?: Yes Does Caregiver/Family have Issues with Lodging/Transportation while Pt is in Rehab?: No  Goals Patient/Family Goal for Rehab: Mod I to supervision with PT and OT at wheelchair  level Expected length of stay: ELOS 7 to 10 days Pt/Family Agrees to Admission and willing to participate: Yes Program Orientation Provided & Reviewed with Pt/Caregiver Including Roles  & Responsibilities: Yes  Decrease burden of Care through IP rehab admission: n/a  Possible need for SNF placement upon discharge: not anticipated  Patient Condition: I have reviewed medical records from [facility] , spoken with  patient. I met with patient at the bedside for inpatient rehabilitation assessment.  Patient will benefit from ongoing PT and OT, can actively participate in 3 hours of therapy a day 5 days of the week, and can make measurable gains during the admission.  Patient will also benefit from the coordinated team approach during an Inpatient Acute Rehabilitation admission.  The patient will receive intensive therapy as well as Rehabilitation physician, nursing, social worker, and care management interventions.  Due to bladder management, bowel management, safety, skin/wound care, disease management, medication administration, pain management, and patient education the patient requires 24 hour a day rehabilitation nursing.  The patient is currently Min to mod assist with mobility and basic ADLs.  Discharge setting and therapy post discharge at home with home health is anticipated.  Patient has agreed to participate in the Acute Inpatient Rehabilitation Program and will admit today.  Preadmission Screen Completed By:  Cleatrice Burke, 06/08/2021 10:33 AM ______________________________________________________________________   Discussed status with Dr. Posey Pronto on 06/08/2021 at 1032 and received approval for admission today.  Admission Coordinator:  Cleatrice Burke, RN, time 2202 Date 06/08/2021   Assessment/Plan: Diagnosis: Right BKA Does the need for close, 24 hr/day Medical supervision in concert with the patient's rehab needs make it unreasonable for this patient to be served  in a less intensive setting? Yes Co-Morbidities requiring supervision/potential complications: chronic pain, type 2 DM, CAD s/p PTCA, HLD, mixed connective tissue disease, hypothyroidism, asthma, GERD Due to bowel management, safety, skin/wound care, disease management, pain management, and patient education, does the patient require 24 hr/day rehab nursing? Yes Does the patient require coordinated care of a physician, rehab  nurse, PT, OT to address physical and functional deficits in the context of the above medical diagnosis(es)? Yes Addressing deficits in the following areas: balance, endurance, locomotion, strength, transferring, bathing, dressing, toileting, and psychosocial support Can the patient actively participate in an intensive therapy program of at least 3 hrs of therapy 5 days a week? Yes The potential for patient to make measurable gains while on inpatient rehab is good Anticipated functional outcomes upon discharge from inpatient rehab:  Mod I  PT, modified independent OT, n/a SLP Estimated rehab length of stay to reach the above functional goals is: 4-6 days. Anticipated discharge destination: Home 10. Overall Rehab/Functional Prognosis: good   MD Signature: Delice Lesch, MD, ABPMR

## 2021-06-08 ENCOUNTER — Encounter (HOSPITAL_COMMUNITY): Payer: Self-pay | Admitting: Physical Medicine and Rehabilitation

## 2021-06-08 ENCOUNTER — Other Ambulatory Visit: Payer: Self-pay

## 2021-06-08 ENCOUNTER — Inpatient Hospital Stay (HOSPITAL_COMMUNITY)
Admission: RE | Admit: 2021-06-08 | Discharge: 2021-06-24 | DRG: 560 | Disposition: A | Discharge status: Home Health | Payer: 59 | Source: Intra-hospital | Attending: Physical Medicine and Rehabilitation | Admitting: Physical Medicine and Rehabilitation

## 2021-06-08 DIAGNOSIS — Z803 Family history of malignant neoplasm of breast: Secondary | ICD-10-CM

## 2021-06-08 DIAGNOSIS — K219 Gastro-esophageal reflux disease without esophagitis: Secondary | ICD-10-CM | POA: Diagnosis present

## 2021-06-08 DIAGNOSIS — M069 Rheumatoid arthritis, unspecified: Secondary | ICD-10-CM | POA: Diagnosis present

## 2021-06-08 DIAGNOSIS — Z794 Long term (current) use of insulin: Secondary | ICD-10-CM

## 2021-06-08 DIAGNOSIS — I10 Essential (primary) hypertension: Secondary | ICD-10-CM | POA: Diagnosis not present

## 2021-06-08 DIAGNOSIS — G2581 Restless legs syndrome: Secondary | ICD-10-CM | POA: Diagnosis present

## 2021-06-08 DIAGNOSIS — Z888 Allergy status to other drugs, medicaments and biological substances status: Secondary | ICD-10-CM

## 2021-06-08 DIAGNOSIS — I110 Hypertensive heart disease with heart failure: Secondary | ICD-10-CM | POA: Diagnosis present

## 2021-06-08 DIAGNOSIS — E1165 Type 2 diabetes mellitus with hyperglycemia: Secondary | ICD-10-CM | POA: Diagnosis present

## 2021-06-08 DIAGNOSIS — G546 Phantom limb syndrome with pain: Secondary | ICD-10-CM | POA: Diagnosis present

## 2021-06-08 DIAGNOSIS — S88111D Complete traumatic amputation at level between knee and ankle, right lower leg, subsequent encounter: Secondary | ICD-10-CM

## 2021-06-08 DIAGNOSIS — M3589 Other specified systemic involvement of connective tissue: Secondary | ICD-10-CM | POA: Diagnosis present

## 2021-06-08 DIAGNOSIS — B9689 Other specified bacterial agents as the cause of diseases classified elsewhere: Secondary | ICD-10-CM

## 2021-06-08 DIAGNOSIS — N390 Urinary tract infection, site not specified: Secondary | ICD-10-CM

## 2021-06-08 DIAGNOSIS — M545 Low back pain, unspecified: Secondary | ICD-10-CM

## 2021-06-08 DIAGNOSIS — I2510 Atherosclerotic heart disease of native coronary artery without angina pectoris: Secondary | ICD-10-CM | POA: Diagnosis present

## 2021-06-08 DIAGNOSIS — I739 Peripheral vascular disease, unspecified: Secondary | ICD-10-CM | POA: Diagnosis not present

## 2021-06-08 DIAGNOSIS — Z87891 Personal history of nicotine dependence: Secondary | ICD-10-CM

## 2021-06-08 DIAGNOSIS — T8131XA Disruption of external operation (surgical) wound, not elsewhere classified, initial encounter: Secondary | ICD-10-CM

## 2021-06-08 DIAGNOSIS — Z801 Family history of malignant neoplasm of trachea, bronchus and lung: Secondary | ICD-10-CM

## 2021-06-08 DIAGNOSIS — Z79890 Hormone replacement therapy (postmenopausal): Secondary | ICD-10-CM

## 2021-06-08 DIAGNOSIS — R7989 Other specified abnormal findings of blood chemistry: Secondary | ICD-10-CM

## 2021-06-08 DIAGNOSIS — Z4781 Encounter for orthopedic aftercare following surgical amputation: Principal | ICD-10-CM

## 2021-06-08 DIAGNOSIS — I5022 Chronic systolic (congestive) heart failure: Secondary | ICD-10-CM | POA: Diagnosis present

## 2021-06-08 DIAGNOSIS — B964 Proteus (mirabilis) (morganii) as the cause of diseases classified elsewhere: Secondary | ICD-10-CM | POA: Diagnosis present

## 2021-06-08 DIAGNOSIS — J45909 Unspecified asthma, uncomplicated: Secondary | ICD-10-CM | POA: Diagnosis present

## 2021-06-08 DIAGNOSIS — B952 Enterococcus as the cause of diseases classified elsewhere: Secondary | ICD-10-CM | POA: Diagnosis not present

## 2021-06-08 DIAGNOSIS — B9620 Unspecified Escherichia coli [E. coli] as the cause of diseases classified elsewhere: Secondary | ICD-10-CM | POA: Diagnosis present

## 2021-06-08 DIAGNOSIS — B370 Candidal stomatitis: Secondary | ICD-10-CM | POA: Diagnosis not present

## 2021-06-08 DIAGNOSIS — I890 Lymphedema, not elsewhere classified: Secondary | ICD-10-CM | POA: Diagnosis present

## 2021-06-08 DIAGNOSIS — T464X5A Adverse effect of angiotensin-converting-enzyme inhibitors, initial encounter: Secondary | ICD-10-CM

## 2021-06-08 DIAGNOSIS — H04123 Dry eye syndrome of bilateral lacrimal glands: Secondary | ICD-10-CM | POA: Diagnosis not present

## 2021-06-08 DIAGNOSIS — M3500 Sicca syndrome, unspecified: Secondary | ICD-10-CM | POA: Diagnosis present

## 2021-06-08 DIAGNOSIS — E1169 Type 2 diabetes mellitus with other specified complication: Secondary | ICD-10-CM | POA: Diagnosis present

## 2021-06-08 DIAGNOSIS — E1142 Type 2 diabetes mellitus with diabetic polyneuropathy: Secondary | ICD-10-CM | POA: Diagnosis present

## 2021-06-08 DIAGNOSIS — E669 Obesity, unspecified: Secondary | ICD-10-CM | POA: Diagnosis present

## 2021-06-08 DIAGNOSIS — Z800 Family history of malignant neoplasm of digestive organs: Secondary | ICD-10-CM

## 2021-06-08 DIAGNOSIS — M5416 Radiculopathy, lumbar region: Secondary | ICD-10-CM | POA: Diagnosis present

## 2021-06-08 DIAGNOSIS — G8918 Other acute postprocedural pain: Secondary | ICD-10-CM | POA: Diagnosis not present

## 2021-06-08 DIAGNOSIS — Z91013 Allergy to seafood: Secondary | ICD-10-CM

## 2021-06-08 DIAGNOSIS — Z89511 Acquired absence of right leg below knee: Secondary | ICD-10-CM | POA: Diagnosis not present

## 2021-06-08 DIAGNOSIS — S88111A Complete traumatic amputation at level between knee and ankle, right lower leg, initial encounter: Secondary | ICD-10-CM

## 2021-06-08 DIAGNOSIS — R110 Nausea: Secondary | ICD-10-CM | POA: Diagnosis not present

## 2021-06-08 DIAGNOSIS — T8141XA Infection following a procedure, superficial incisional surgical site, initial encounter: Secondary | ICD-10-CM | POA: Diagnosis not present

## 2021-06-08 DIAGNOSIS — Y835 Amputation of limb(s) as the cause of abnormal reaction of the patient, or of later complication, without mention of misadventure at the time of the procedure: Secondary | ICD-10-CM | POA: Diagnosis present

## 2021-06-08 DIAGNOSIS — R7881 Bacteremia: Secondary | ICD-10-CM | POA: Diagnosis present

## 2021-06-08 DIAGNOSIS — Z79899 Other long term (current) drug therapy: Secondary | ICD-10-CM

## 2021-06-08 DIAGNOSIS — Z6841 Body Mass Index (BMI) 40.0 and over, adult: Secondary | ICD-10-CM | POA: Diagnosis not present

## 2021-06-08 DIAGNOSIS — D62 Acute posthemorrhagic anemia: Secondary | ICD-10-CM

## 2021-06-08 DIAGNOSIS — E785 Hyperlipidemia, unspecified: Secondary | ICD-10-CM | POA: Diagnosis present

## 2021-06-08 DIAGNOSIS — Z7984 Long term (current) use of oral hypoglycemic drugs: Secondary | ICD-10-CM

## 2021-06-08 DIAGNOSIS — T8149XA Infection following a procedure, other surgical site, initial encounter: Secondary | ICD-10-CM

## 2021-06-08 DIAGNOSIS — R252 Cramp and spasm: Secondary | ICD-10-CM | POA: Diagnosis present

## 2021-06-08 DIAGNOSIS — Z9861 Coronary angioplasty status: Secondary | ICD-10-CM

## 2021-06-08 DIAGNOSIS — Z7982 Long term (current) use of aspirin: Secondary | ICD-10-CM

## 2021-06-08 DIAGNOSIS — E039 Hypothyroidism, unspecified: Secondary | ICD-10-CM | POA: Diagnosis present

## 2021-06-08 DIAGNOSIS — Z825 Family history of asthma and other chronic lower respiratory diseases: Secondary | ICD-10-CM

## 2021-06-08 DIAGNOSIS — Z8249 Family history of ischemic heart disease and other diseases of the circulatory system: Secondary | ICD-10-CM

## 2021-06-08 LAB — GLUCOSE, CAPILLARY
Glucose-Capillary: 166 mg/dL — ABNORMAL HIGH (ref 70–99)
Glucose-Capillary: 206 mg/dL — ABNORMAL HIGH (ref 70–99)
Glucose-Capillary: 113 mg/dL — ABNORMAL HIGH (ref 70–99)
Glucose-Capillary: 220 mg/dL — ABNORMAL HIGH (ref 70–99)
Glucose-Capillary: 197 mg/dL — ABNORMAL HIGH (ref 70–99)

## 2021-06-08 LAB — PROCALCITONIN: Procalcitonin: <0.1 ng/mL

## 2021-06-08 MED ORDER — INSULIN ASPART 100 UNIT/ML IJ SOLN
0.0000 [IU] | Freq: Three times a day (TID) | INTRAMUSCULAR | Status: DC
  Administered 2021-06-09: 3 [IU] via SUBCUTANEOUS
  Administered 2021-06-09 – 2021-06-10 (×3): 4 [IU] via SUBCUTANEOUS
  Administered 2021-06-10: 3 [IU] via SUBCUTANEOUS
  Administered 2021-06-11 (×2): 4 [IU] via SUBCUTANEOUS
  Administered 2021-06-11 – 2021-06-13 (×6): 3 [IU] via SUBCUTANEOUS
  Administered 2021-06-14 – 2021-06-15 (×2): 4 [IU] via SUBCUTANEOUS
  Administered 2021-06-15 – 2021-06-16 (×2): 3 [IU] via SUBCUTANEOUS
  Administered 2021-06-16 – 2021-06-17 (×2): 4 [IU] via SUBCUTANEOUS
  Administered 2021-06-17 – 2021-06-18 (×4): 3 [IU] via SUBCUTANEOUS
  Administered 2021-06-19: 4 [IU] via SUBCUTANEOUS
  Administered 2021-06-19 – 2021-06-20 (×3): 3 [IU] via SUBCUTANEOUS
  Administered 2021-06-20 – 2021-06-21 (×2): 4 [IU] via SUBCUTANEOUS
  Administered 2021-06-21 – 2021-06-24 (×5): 3 [IU] via SUBCUTANEOUS

## 2021-06-08 MED ORDER — SODIUM CHLORIDE 0.9 % IV SOLN: 2.0000 g | INTRAVENOUS | Status: DC

## 2021-06-08 MED ORDER — PROCHLORPERAZINE EDISYLATE 10 MG/2ML IJ SOLN: 5.0000 mg | Freq: Four times a day (QID) | INTRAMUSCULAR | Status: DC | PRN

## 2021-06-08 MED ORDER — PROCHLORPERAZINE 25 MG RE SUPP
12.5000 mg | Freq: Four times a day (QID) | RECTAL | Status: DC | PRN
Start: 2021-06-08 — End: 2021-06-24

## 2021-06-08 MED ORDER — PROCHLORPERAZINE MALEATE 5 MG PO TABS
5.0000 mg | ORAL_TABLET | Freq: Four times a day (QID) | ORAL | Status: DC | PRN
  Administered 2021-06-15 – 2021-06-19 (×3): 10 mg via ORAL
  Administered 2021-06-22: 5 mg via ORAL
  Filled 2021-06-08 (×4): qty 2

## 2021-06-08 MED ORDER — ENOXAPARIN SODIUM 80 MG/0.8ML IJ SOSY
75.0000 mg | PREFILLED_SYRINGE | INTRAMUSCULAR | Status: DC
  Administered 2021-06-09 – 2021-06-24 (×16): 75 mg via SUBCUTANEOUS
  Filled 2021-06-08 (×15): qty 0.8

## 2021-06-08 MED ORDER — PROMETHAZINE HCL 25 MG PO TABS
25.0000 mg | ORAL_TABLET | Freq: Three times a day (TID) | ORAL | Status: DC | PRN
  Administered 2021-06-17: 25 mg via ORAL
  Filled 2021-06-08 (×3): qty 1

## 2021-06-08 MED ORDER — TRAMADOL HCL 50 MG PO TABS
50.0000 mg | ORAL_TABLET | Freq: Four times a day (QID) | ORAL | Status: DC | PRN
  Administered 2021-06-08 – 2021-06-11 (×6): 50 mg via ORAL
  Filled 2021-06-08 (×8): qty 1

## 2021-06-08 MED ORDER — FLEET ENEMA 7-19 GM/118ML RE ENEM: 1.0000 | ENEMA | Freq: Once | RECTAL | Status: DC | PRN

## 2021-06-08 MED ORDER — DIPHENHYDRAMINE HCL 12.5 MG/5ML PO ELIX: 12.5000 mg | ORAL_SOLUTION | Freq: Four times a day (QID) | ORAL | Status: DC | PRN

## 2021-06-08 MED ORDER — TRAZODONE HCL 50 MG PO TABS
25.0000 mg | ORAL_TABLET | Freq: Every evening | ORAL | Status: DC | PRN
  Administered 2021-06-12: 50 mg via ORAL
  Filled 2021-06-08 (×2): qty 1

## 2021-06-08 MED ORDER — IRBESARTAN 75 MG PO TABS
150.0000 mg | ORAL_TABLET | Freq: Every day | ORAL | Status: DC
  Administered 2021-06-09 – 2021-06-24 (×16): 150 mg via ORAL
  Filled 2021-06-08 (×8): qty 2
  Filled 2021-06-08: qty 1
  Filled 2021-06-08: qty 2
  Filled 2021-06-08: qty 1
  Filled 2021-06-08 (×4): qty 2
  Filled 2021-06-08: qty 1
  Filled 2021-06-08: qty 2

## 2021-06-08 MED ORDER — SODIUM CHLORIDE 0.9 % IV SOLN
2.0000 g | INTRAVENOUS | Status: AC
  Administered 2021-06-08 – 2021-06-09 (×4): 2 g via INTRAVENOUS
  Filled 2021-06-08 (×4): qty 2000

## 2021-06-08 MED ORDER — CYCLOBENZAPRINE HCL 10 MG PO TABS
10.0000 mg | ORAL_TABLET | Freq: Three times a day (TID) | ORAL | Status: DC
  Administered 2021-06-09 – 2021-06-10 (×4): 10 mg via ORAL
  Filled 2021-06-08 (×4): qty 1

## 2021-06-08 MED ORDER — NITROGLYCERIN 0.4 MG SL SUBL: 0.4000 mg | SUBLINGUAL_TABLET | SUBLINGUAL | Status: DC | PRN

## 2021-06-08 MED ORDER — ISOSORBIDE MONONITRATE ER 30 MG PO TB24
30.0000 mg | ORAL_TABLET | Freq: Every day | ORAL | Status: DC
  Administered 2021-06-09 – 2021-06-24 (×16): 30 mg via ORAL
  Filled 2021-06-08 (×16): qty 1

## 2021-06-08 MED ORDER — HYDROXYCHLOROQUINE SULFATE 200 MG PO TABS
200.0000 mg | ORAL_TABLET | Freq: Two times a day (BID) | ORAL | Status: DC
  Administered 2021-06-09 – 2021-06-24 (×31): 200 mg via ORAL
  Filled 2021-06-08 (×32): qty 1

## 2021-06-08 MED ORDER — PANTOPRAZOLE SODIUM 40 MG PO TBEC
40.0000 mg | DELAYED_RELEASE_TABLET | Freq: Every day | ORAL | Status: DC | PRN
  Administered 2021-06-23: 40 mg via ORAL
  Filled 2021-06-08 (×2): qty 1

## 2021-06-08 MED ORDER — CALCIUM CARBONATE ANTACID 500 MG PO CHEW
1.0000 | CHEWABLE_TABLET | Freq: Three times a day (TID) | ORAL | Status: DC | PRN
  Administered 2021-06-15 – 2021-06-17 (×2): 200 mg via ORAL
  Filled 2021-06-08 (×2): qty 1

## 2021-06-08 MED ORDER — INSULIN ASPART 100 UNIT/ML FLEXPEN: PEN_INJECTOR | SUBCUTANEOUS | 0 refills | Status: DC

## 2021-06-08 MED ORDER — LEVOTHYROXINE SODIUM 100 MCG PO TABS
200.0000 ug | ORAL_TABLET | Freq: Every day | ORAL | Status: DC
  Administered 2021-06-09 – 2021-06-24 (×16): 200 ug via ORAL
  Filled 2021-06-08 (×17): qty 2

## 2021-06-08 MED ORDER — SACCHAROMYCES BOULARDII 250 MG PO CAPS
250.0000 mg | ORAL_CAPSULE | Freq: Two times a day (BID) | ORAL | Status: DC
  Administered 2021-06-09 – 2021-06-24 (×31): 250 mg via ORAL
  Filled 2021-06-08 (×31): qty 1

## 2021-06-08 MED ORDER — GABAPENTIN 400 MG PO CAPS
400.0000 mg | ORAL_CAPSULE | Freq: Two times a day (BID) | ORAL | Status: DC
  Administered 2021-06-09 – 2021-06-24 (×32): 400 mg via ORAL
  Filled 2021-06-08 (×32): qty 1

## 2021-06-08 MED ORDER — FLUCONAZOLE 100 MG PO TABS
200.0000 mg | ORAL_TABLET | Freq: Once | ORAL | Status: AC
  Administered 2021-06-08: 200 mg via ORAL
  Filled 2021-06-08: qty 2

## 2021-06-08 MED ORDER — GABAPENTIN 400 MG PO CAPS
800.0000 mg | ORAL_CAPSULE | Freq: Every day | ORAL | Status: DC
  Administered 2021-06-09 – 2021-06-23 (×15): 800 mg via ORAL
  Filled 2021-06-08 (×15): qty 2

## 2021-06-08 MED ORDER — ACETAMINOPHEN 500 MG PO TABS
500.0000 mg | ORAL_TABLET | Freq: Three times a day (TID) | ORAL | Status: DC
  Administered 2021-06-09 – 2021-06-24 (×62): 500 mg via ORAL
  Filled 2021-06-08 (×61): qty 1

## 2021-06-08 MED ORDER — HYDROCORTISONE 1 % EX CREA: 1.0000 "application " | TOPICAL_CREAM | Freq: Three times a day (TID) | CUTANEOUS | Status: DC | PRN

## 2021-06-08 MED ORDER — INSULIN ASPART 100 UNIT/ML IJ SOLN
22.0000 [IU] | Freq: Three times a day (TID) | INTRAMUSCULAR | Status: DC
  Administered 2021-06-09 – 2021-06-24 (×42): 22 [IU] via SUBCUTANEOUS

## 2021-06-08 MED ORDER — NAPHAZOLINE-GLYCERIN 0.012-0.25 % OP SOLN
1.0000 [drp] | Freq: Three times a day (TID) | OPHTHALMIC | Status: DC
  Administered 2021-06-09 (×2): 2 [drp] via OPHTHALMIC
  Administered 2021-06-09 (×2): 1 [drp] via OPHTHALMIC
  Administered 2021-06-10 – 2021-06-12 (×9): 2 [drp] via OPHTHALMIC
  Administered 2021-06-12: 1 [drp] via OPHTHALMIC
  Administered 2021-06-12: 2 [drp] via OPHTHALMIC
  Administered 2021-06-13 (×2): 1 [drp] via OPHTHALMIC
  Administered 2021-06-13 – 2021-06-14 (×4): 2 [drp] via OPHTHALMIC
  Administered 2021-06-14: 1 [drp] via OPHTHALMIC
  Administered 2021-06-15: 2 [drp] via OPHTHALMIC
  Filled 2021-06-08: qty 15

## 2021-06-08 MED ORDER — FLUTICASONE PROPIONATE 50 MCG/ACT NA SUSP
2.0000 | Freq: Every day | NASAL | Status: DC | PRN
  Administered 2021-06-17: 2 via NASAL
  Filled 2021-06-08: qty 16

## 2021-06-08 MED ORDER — MAGNESIUM OXIDE -MG SUPPLEMENT 400 (240 MG) MG PO TABS: 400.0000 mg | ORAL_TABLET | Freq: Two times a day (BID) | ORAL | Status: DC | PRN

## 2021-06-08 MED ORDER — ACETAMINOPHEN 325 MG PO TABS
325.0000 mg | ORAL_TABLET | ORAL | Status: DC | PRN
  Administered 2021-06-09 – 2021-06-23 (×2): 500 mg via ORAL

## 2021-06-08 MED ORDER — METFORMIN HCL ER 500 MG PO TB24
1000.0000 mg | ORAL_TABLET | Freq: Two times a day (BID) | ORAL | Status: DC
  Administered 2021-06-09 – 2021-06-24 (×31): 1000 mg via ORAL
  Filled 2021-06-08 (×32): qty 2

## 2021-06-08 MED ORDER — EZETIMIBE 10 MG PO TABS
10.0000 mg | ORAL_TABLET | Freq: Every day | ORAL | Status: DC
  Administered 2021-06-09 – 2021-06-24 (×16): 10 mg via ORAL
  Filled 2021-06-08 (×16): qty 1

## 2021-06-08 MED ORDER — POLYETHYLENE GLYCOL 3350 17 G PO PACK
17.0000 g | PACK | Freq: Every day | ORAL | Status: DC | PRN
  Administered 2021-06-09: 17 g via ORAL

## 2021-06-08 MED ORDER — MUPIROCIN 2 % EX OINT
TOPICAL_OINTMENT | Freq: Three times a day (TID) | CUTANEOUS | Status: DC
  Administered 2021-06-09: 1 via TOPICAL
  Filled 2021-06-08: qty 22

## 2021-06-08 MED ORDER — PHENOL 1.4 % MT LIQD
1.0000 | OROMUCOSAL | Status: DC | PRN
  Filled 2021-06-08: qty 177

## 2021-06-08 MED ORDER — GUAIFENESIN-DM 100-10 MG/5ML PO SYRP
5.0000 mL | ORAL_SOLUTION | Freq: Four times a day (QID) | ORAL | Status: DC | PRN
  Administered 2021-06-16: 10 mL via ORAL
  Filled 2021-06-08: qty 10

## 2021-06-08 MED ORDER — BISACODYL 10 MG RE SUPP
10.0000 mg | Freq: Every day | RECTAL | Status: DC | PRN
Start: 2021-06-08 — End: 2021-06-24

## 2021-06-08 MED ORDER — DILTIAZEM HCL ER COATED BEADS 120 MG PO CP24
240.0000 mg | ORAL_CAPSULE | Freq: Every day | ORAL | Status: DC
  Administered 2021-06-09 – 2021-06-24 (×16): 240 mg via ORAL
  Filled 2021-06-08 (×16): qty 2

## 2021-06-08 MED ORDER — ADULT MULTIVITAMIN W/MINERALS CH
1.0000 | ORAL_TABLET | Freq: Every day | ORAL | Status: DC
  Administered 2021-06-09 – 2021-06-24 (×16): 1 via ORAL
  Filled 2021-06-08 (×16): qty 1

## 2021-06-08 MED ORDER — SIMETHICONE 80 MG PO CHEW
80.0000 mg | CHEWABLE_TABLET | Freq: Four times a day (QID) | ORAL | Status: DC | PRN
  Filled 2021-06-08: qty 1

## 2021-06-08 MED ORDER — PRAMIPEXOLE DIHYDROCHLORIDE 0.25 MG PO TABS
0.5000 mg | ORAL_TABLET | ORAL | Status: DC | PRN
  Administered 2021-06-20 – 2021-06-22 (×2): 0.5 mg via ORAL
  Filled 2021-06-08 (×3): qty 2

## 2021-06-08 MED ORDER — ASPIRIN EC 81 MG PO TBEC
81.0000 mg | DELAYED_RELEASE_TABLET | Freq: Every day | ORAL | Status: DC
  Administered 2021-06-09 – 2021-06-24 (×16): 81 mg via ORAL
  Filled 2021-06-08 (×16): qty 1

## 2021-06-08 MED ORDER — SENNOSIDES-DOCUSATE SODIUM 8.6-50 MG PO TABS
1.0000 | ORAL_TABLET | Freq: Every day | ORAL | Status: DC
  Administered 2021-06-09 – 2021-06-11 (×3): 1 via ORAL
  Filled 2021-06-08 (×3): qty 1

## 2021-06-08 MED ORDER — COLLAGENASE 250 UNIT/GM EX OINT
TOPICAL_OINTMENT | Freq: Every day | CUTANEOUS | Status: DC
  Administered 2021-06-17 – 2021-06-18 (×2): 1 via TOPICAL
  Filled 2021-06-08: qty 30

## 2021-06-08 MED ORDER — INSULIN ASPART 100 UNIT/ML IJ SOLN: 0.0000 [IU] | Freq: Every day | INTRAMUSCULAR | Status: DC

## 2021-06-08 MED ORDER — INSULIN ASPART 100 UNIT/ML IJ SOLN: 22.0000 [IU] | Freq: Three times a day (TID) | INTRAMUSCULAR | 0 refills | Status: DC

## 2021-06-08 MED ORDER — OXYCODONE HCL 5 MG PO TABS
20.0000 mg | ORAL_TABLET | ORAL | Status: DC | PRN
  Administered 2021-06-09 – 2021-06-13 (×9): 20 mg via ORAL
  Filled 2021-06-08 (×11): qty 4

## 2021-06-08 MED ORDER — LORATADINE 10 MG PO TABS
10.0000 mg | ORAL_TABLET | Freq: Every day | ORAL | Status: DC
  Administered 2021-06-09 – 2021-06-23 (×15): 10 mg via ORAL
  Filled 2021-06-08 (×15): qty 1

## 2021-06-08 MED ORDER — CAMPHOR-MENTHOL 0.5-0.5 % EX LOTN: TOPICAL_LOTION | CUTANEOUS | Status: DC | PRN

## 2021-06-08 MED ORDER — INSULIN GLARGINE-YFGN 100 UNIT/ML ~~LOC~~ SOLN
56.0000 [IU] | Freq: Every day | SUBCUTANEOUS | Status: DC
  Administered 2021-06-09 – 2021-06-22 (×14): 56 [IU] via SUBCUTANEOUS
  Filled 2021-06-08 (×14): qty 0.56

## 2021-06-08 MED ORDER — ALBUTEROL SULFATE (2.5 MG/3ML) 0.083% IN NEBU: 2.5000 mg | INHALATION_SOLUTION | RESPIRATORY_TRACT | Status: DC | PRN

## 2021-06-08 MED ORDER — VITAMIN D (ERGOCALCIFEROL) 1.25 MG (50000 UNIT) PO CAPS
50000.0000 [IU] | ORAL_CAPSULE | ORAL | Status: DC
  Administered 2021-06-10 – 2021-06-17 (×2): 50000 [IU] via ORAL
  Filled 2021-06-08 (×3): qty 1

## 2021-06-08 MED ORDER — INSULIN GLARGINE-YFGN 100 UNIT/ML ~~LOC~~ SOLN: 56.0000 [IU] | Freq: Every day | SUBCUTANEOUS | 0 refills | Status: DC

## 2021-06-08 MED ORDER — PRAMIPEXOLE DIHYDROCHLORIDE 1 MG PO TABS
1.0000 mg | ORAL_TABLET | Freq: Two times a day (BID) | ORAL | Status: DC
  Administered 2021-06-09 – 2021-06-13 (×9): 1 mg via ORAL
  Filled 2021-06-08 (×10): qty 1

## 2021-06-08 NOTE — Progress Notes (Signed)
Pt admitted to inpt. Rehab. Room 5C08. No complaints at this time.

## 2021-06-08 NOTE — Progress Notes (Signed)
Inpatient Rehabilitation Admissions Coordinator   I have insurance approval to admit patient to CIR today. Patient, acute team and TOC made aware. I will make the arrangements to admit today.  Ottie Glazier, RN, MSN Rehab Admissions Coordinator [phone] 06/08/2021 10:22 AM

## 2021-06-08 NOTE — H&P (Addendum)
Physical Medicine and Rehabilitation Admission H&P    Chief Complaint  Patient presents with   R-BKA/bacteremia    HPI: [name] is a 60 year old female with history of [name], NAFL, CAD/chronic diastolic CHF, chronic pain-on ocycodone, super morbid obesity--BMI 62, Sjogren's/mixed connective tissure disorder- on plaquenil, lymphedema right foot/shin ulcers as well as multiple other ulcers followed by wound clinic who was admitted on 05/26/21 with sepsis from Enterococcus faecalis bacteremia secondary to cellulitis and chronic diabetic foot ulcer. History taken from chart review and patient. She was started on broad spectrum antibiotics and amputation recommended due to failure of conservative therapy. She underwent right BKA by Dr. Lajoyce Corners on 06/01/21 and TEE done at that time was negative for endocarditis or valvular disease. Dr. Earlene Plater recommended  transition to ampicillin as anerobic coverage not indicated post amputation-->continue for 7 days post OR thorough 09/28 to treat bacteremia. Wound Vac was applied for 7 days. Post op has had issues with significant phantom pain but reports is feeling the "best she has felt in years". Blood sugars have been labile and Lantus added and insulin being titrated upwards.  Therapy has been ongoing and CIR recommended due to functional decline. Please see preadmission assessment from earlier today as well.   Review of Systems  Constitutional:  Negative for chills and fever.  HENT:  Negative for hearing loss and tinnitus.   Eyes:  Negative for blurred vision and double vision.  Respiratory:  Negative for shortness of breath and wheezing.   Cardiovascular:  Positive for leg swelling. Negative for chest pain and palpitations.  Gastrointestinal:  Negative for abdominal pain, constipation, heartburn and nausea.  Genitourinary:  Negative for dysuria and urgency.       Burning in vaginal area  Musculoskeletal:  Positive for back pain and myalgias.  Skin:   Positive for rash.  Neurological:  Positive for dizziness (on standing--brief and resolves), sensory change and weakness.  Psychiatric/Behavioral:  The patient does not have insomnia.   All other systems reviewed and are negative.   Past Medical History:  Diagnosis Date   Aortic stenosis    mild AS by echo 02/2021   Asthma    "on daily RX and rescue inhaler" (04/18/2018)   Chronic diastolic CHF (congestive heart failure) (HCC) 09/2015   Chronic lower back pain    Chronic neck pain    Chronic pain syndrome    Fentanyl Patch   Colon polyps    Coronary artery disease    cath with normal LM, 30% LAD, 85% mid RCA and 95% distal RCA s/p PCI of the mid to distal RCA and now on DAPT with ASA and Ticagrelor.     Eczema    Excessive daytime sleepiness 11/26/2015   Fibromyalgia    Gallstones    GERD (gastroesophageal reflux disease)    Heart murmur    "noted for the 1st time on 04/18/2018"   History of blood transfusion 07/2010   "S/P oophorectomy"   History of gout    History of hiatal hernia 1980s   "gone now" (04/18/2018)   Hyperlipidemia    Hypertension    takes Metoprolol and Enalapril daily   Hypothyroidism    takes Synthroid daily   IBS (irritable bowel syndrome)    Migraine    "nothing in the 2000s" (04/18/2018)   Mixed connective tissue disease (HCC)    NAFLD (nonalcoholic fatty liver disease)    Pneumonia    "several times" (04/18/2018)   PVC's (premature  ventricular contractions)    noted on event monitor 02/2021   Rheumatoid arthritis (HCC)    "hands, elbows, shoulders, probably knees" (04/18/2018)   Scoliosis    Spondylosis    Type II diabetes mellitus (HCC)    takes Metformin and Hum R daily (04/18/2018)    Past Surgical History:  Procedure Laterality Date   ABDOMINAL HYSTERECTOMY  06/2005   "w/right ovariy"   AMPUTATION Right 01/28/2020    RIGHT FOURTH AND FIFTH RAY AMPUTATION   AMPUTATION Right 01/28/2020   Procedure: RIGHT FOURTH AND FIFTH RAY AMPUTATION,;  Surgeon:  [name], MD;  Location: [facility] OR;  Service: Orthopedics;  Laterality: Right;   AMPUTATION Right 06/01/2021   Procedure: RIGHT BELOW KNEE AMPUTATION;  Surgeon: [name], MD;  Location: [facility] OR;  Service: Orthopedics;  Laterality: Right;   APPENDECTOMY     BREAST BIOPSY Bilateral    9 total (04/18/2018)   CORONARY ANGIOPLASTY WITH STENT PLACEMENT  10/01/2015   normal LM, 30% LAD, 85% mid RCA and 95% distal RCA s/p PCI of the mid to distal RCA and now on DAPT with ASA and Ticagrelor.     CORONARY STENT INTERVENTION Right 04/18/2018   Procedure: CORONARY STENT INTERVENTION;  Surgeon: [name], MD;  Location: [facility];  Service: Cardiovascular;  Laterality: Right;   DILATION AND CURETTAGE OF UTERUS     FRACTURE SURGERY     LAPAROSCOPIC CHOLECYSTECTOMY     LEFT HEART CATH AND CORONARY ANGIOGRAPHY N/A 04/18/2018   Procedure: LEFT HEART CATH AND CORONARY ANGIOGRAPHY;  Surgeon: [name], MD;  Location: [facility];  Service: Cardiovascular;  Laterality: N/A;   MUSCLE BIOPSY Left    "leg"   OOPHORECTOMY  07/2010   RADIOLOGY WITH ANESTHESIA N/A 05/25/2016   Procedure: RADIOLOGY WITH ANESTHESIA;  Surgeon: Medication Radiologist, MD;  Location: [facility] OR;  Service: Radiology;  Laterality: N/A;   TEE WITHOUT CARDIOVERSION N/A 06/01/2021   Procedure: TRANSESOPHAGEAL ECHOCARDIOGRAM (TEE);  Surgeon: Sande Rives, MD;  Location: [facility] OR;  Service: Cardiovascular;  Laterality: N/A;   TONSILLECTOMY AND ADENOIDECTOMY     WRIST FRACTURE SURGERY Left    "crushed it"   Family History  Problem Relation Age of Onset   CAD Mother    Hypertension Mother    Heart attack Mother    CAD Father    Heart attack Father    Allergic rhinitis Father    Asthma Father    Hypertension Brother    Hypertension Brother    Pancreatic cancer Paternal Aunt    Breast cancer Paternal Aunt    Lung cancer Paternal Aunt    Asthma Son     Social History:  reports that she quit  smoking about 17 years ago. Her smoking use included cigarettes. She started smoking about 36 years ago. She has a 19.00 pack-year smoking history. She has never used smokeless tobacco. She reports current alcohol use. She reports that she does not currently use drugs after having used the following drugs: Marijuana.    Allergies  Allergen Reactions   Fish Allergy Anaphylaxis    INCLUDES OMEGA 3 OILS   Fish Oil Anaphylaxis and Swelling    THROAT SWELLS INCLUDES FISH AS A CLASS   Omega-3 Fatty Acids Swelling    Throat swelling  Has tolerated Glucerna nutritional drinks    Crestor [Rosuvastatin] Other (See Comments)    Extreme joint pain, to this & other statins   Gabapentin Anxiety  Anxiety on high doses   Lovastatin Other (See Comments)    EXTREME JOINT PAIN   Metronidazole Nausea Only and Swelling    Headache and shakes Nausea and vomiting   Red Yeast Rice [Cholestin] Other (See Comments)    Muscle pain and severe joint pain.    Rotigotine Swelling    Extreme edema    Atrovent Hfa [Ipratropium Bromide Hfa] Other (See Comments)    wheezing   Iodine Swelling   Red Yeast Rice Extract     Joint pain   Victoza [Liraglutide]     Unsure of reaction    Enalapril Cough    Cough Cough   Suprep [Na Sulfate-K Sulfate-Mg Sulf] Nausea And Vomiting   Tape Other (See Comments)    Electrodes causes skin breakdown    Medications Prior to Admission  Medication Sig Dispense Refill   acetaminophen (TYLENOL) 650 MG CR tablet Take 1,300 mg by mouth in the morning and at bedtime.     albuterol (PROVENTIL) (2.5 MG/3ML) 0.083% nebulizer solution USE 1 VIAL IN NEBULIZER EVERY 4 HOURS AS NEEDED FOR WHEEZING OR  FOR  SHORTNESS  OF  BREATH (Patient taking differently: Take 2.5 mg by nebulization every 4 (four) hours as needed.) 450 mL 0   albuterol (VENTOLIN HFA) 108 (90 Base) MCG/ACT inhaler Inhale 2 puffs into the lungs every 4 (four) hours as needed for wheezing or shortness of breath. 18 g 5    aspirin EC 81 MG tablet Take 81 mg by mouth daily.     Bempedoic Acid-Ezetimibe (NEXLIZET) 180-10 MG TABS Take 1 tablet by mouth daily. 30 tablet 11   clotrimazole-betamethasone (LOTRISONE) cream Apply 1 application topically 2 (two) times daily. 30 g 0   collagenase (SANTYL) ointment Apply 1 application topically daily.     cyclobenzaprine (FLEXERIL) 10 MG tablet Take 0.5-1 tablets (5-10 mg total) by mouth 3 (three) times daily as needed for muscle spasms. 30 tablet 0   diltiazem (DILT-XR) 240 MG 24 hr capsule Take 1 capsule (240 mg total) by mouth daily. 90 capsule 3   diphenhydrAMINE (BENADRYL) 25 MG tablet Take 25 mg by mouth daily as needed for allergies.     EPINEPHrine (EPIPEN 2-PAK) 0.3 mg/0.3 mL IJ SOAJ injection USE AS DIRECTED FOR SEVERE ALLERGIC REACTION. (Patient taking differently: Inject 0.3 mg into the muscle as needed for anaphylaxis. Use as directed for severe allergic reaction) 2 each 0   esomeprazole (NEXIUM) 40 MG capsule Take 40 mg by mouth daily as needed (Heartburn).     fluticasone (FLONASE) 50 MCG/ACT nasal spray USE 2 SPRAY(S) IN EACH NOSTRIL ONCE DAILY AS NEEDED FOR  ALLERGIES  OR  RHINITIS (Patient taking differently: Place 2 sprays into both nostrils daily as needed for rhinitis or allergies.) 16 g 0   furosemide (LASIX) 20 MG tablet Take 0.5 tablets (10 mg total) by mouth daily. As needed for swelling 30 tablet 3   gabapentin (NEURONTIN) 400 MG capsule Take 1,600 mg by mouth as directed. 400 mg in morning, 400 mg at noon, 800 mg at night     HUMULIN R 500 UNIT/ML injection INJECT 0.08-0.3 MLS (40-150 UNITS TOTAL) INTO THE SKIN THREE TIMES DAILY WITH MEALS (Patient taking differently: Inject into the skin 3 (three) times daily with meals. Inject 40-150 units total into the skin 3 times daily with meals) 20 mL 0   hydroxychloroquine (PLAQUENIL) 200 MG tablet Take 1 tablet (200 mg total) by mouth 2 (two) times daily. 180 tablet 0  isosorbide mononitrate (IMDUR) 30 MG 24  hr tablet Take 1 tablet (30 mg total) by mouth daily. 90 tablet 3   levocetirizine (XYZAL) 5 MG tablet TAKE 1 TABLET BY MOUTH ONCE DAILY IN THE EVENING (Patient taking differently: Take 5 mg by mouth every evening.) 90 tablet 0   levothyroxine (SYNTHROID) 200 MCG tablet TAKE 1 TABLET BY MOUTH ONCE DAILY BEFORE BREAKFAST (Patient taking differently: Take 200 mcg by mouth daily before breakfast.) 90 tablet 0   magnesium oxide (MAG-OX) 400 MG tablet Take 400-1,600 mg by mouth 2 (two) times daily as needed (cramps).      metFORMIN (GLUCOPHAGE-XR) 500 MG 24 hr tablet Take 2 tablets by mouth twice daily 360 tablet 0   mupirocin ointment (BACTROBAN) 2 % Apply 1 application topically 2 (two) times daily.     nitroGLYCERIN (NITROSTAT) 0.4 MG SL tablet DISSOLVE ONE TABLET UNDER THE TONGUE EVERY 5 MINUTES AS NEEDED FOR CHEST PAIN.  DO NOT EXCEED A TOTAL OF 3 DOSES IN 15 MINUTES (Patient taking differently: Place 0.4 mg under the tongue every 5 (five) minutes as needed for chest pain.) 25 tablet 0   Oxycodone HCl 10 MG TABS Take 10 mg by mouth every 8 (eight) hours as needed (pain).     promethazine (PHENERGAN) 25 MG tablet TAKE 1 TABLET BY MOUTH EVERY 8 HOURS AS NEEDED FOR NAUSEA AND FOR VOMITING (Patient taking differently: 25 mg every 8 (eight) hours as needed for vomiting or nausea.) 90 tablet 2   valsartan (DIOVAN) 160 MG tablet Take 1 tablet (160 mg total) by mouth daily. 90 tablet 3   doxycycline (VIBRA-TABS) 100 MG tablet Take 1 tablet (100 mg total) by mouth 2 (two) times daily. (Patient not taking: No sig reported) 60 tablet 0   fluconazole (DIFLUCAN) 150 MG tablet Take 1 tablet (150 mg total) by mouth every 3 (three) days. (Patient not taking: No sig reported) 10 tablet 2   glucose blood test strip 1 each 4 (four) times daily.      INSULIN SYRINGE 1CC/29G (B-D INSULIN SYRINGE) 29G X 1/2" 1 ML MISC Use three times daily with insulin.  DX E11.9 200 each 3   potassium bicarbonate (K-LYTE) 25 MEQ  disintegrating tablet Take 25 mEq daily with every dose of Lasix. 30 tablet 5   pramipexole (MIRAPEX) 0.5 MG tablet Take 6 mg by mouth See admin instructions. Take 0.5 mg up to 6 times daily as needed for restless legs     saccharomyces boulardii (FLORASTOR) 250 MG capsule Take 250 mg by mouth 3 (three) times daily.     Vitamin D, Ergocalciferol, (DRISDOL) 1.25 MG (50000 UNIT) CAPS capsule TAKE 1 CAPSULE BY MOUTH ONCE A WEEK (EVERY  7  DAYS)  TAKE  ON  FRIDAY (Patient taking differently: Take 50,000 Units by mouth every 7 (seven) days. TAKE 1 CAPSULE BY MOUTH ONCE A WEEK (EVERY  7  DAYS)  TAKE  ON  FRIDAY) 12 capsule 3    Drug Regimen Review  Drug regimen was reviewed and remains appropriate with no significant issues identified  Home: Home Living Family/patient expects to be discharged to:: Private residence Living Arrangements: Spouse/significant other Available Help at Discharge: Family, Available 24 hours/day Type of Home: House Home Access: Ramped entrance Home Layout: Two level, Able to live on main level with bedroom/bathroom Bathroom Shower/Tub: Engineer, manufacturing systems: Handicapped height Bathroom Accessibility: Yes Home Equipment: Tub bench, Bedside commode, Walker - 4 wheels, Walker - 2 wheels, Wheelchair - manual Additional  Comments: Uses BSC and places it near the toilet.  Lives With: Spouse   Functional History: Prior Function Level of Independence: Independent, Needs assistance Gait / Transfers Assistance Needed: Independent without DME to walk household distances, will hold onto furniture if needed for balance. Does not walk outside, husband pushes her in w/c; sleeps in recliner often ADL's / Homemaking Assistance Needed: Husband does majority of cooking and cleaning, will assist with back and shoes/socks on occasion.  Functional Status:  Mobility: Bed Mobility Overal bed mobility: Needs Assistance Bed Mobility: Supine to Sit, Rolling, Sit to Supine Rolling:  Modified independent (Device/Increase time) Supine to sit: HOB elevated, Min guard Sit to supine: Supervision General bed mobility comments: sitting EOB with RLE up on bed with knee extended on arrival; to supine and rolling for bed exercises for LEs; return to sit EOB Transfers Overall transfer level: Needs assistance Equipment used: Rolling walker (2 wheeled) Transfers: Sit to/from Stand Sit to Stand: Min assist Stand pivot transfers: Min assist, +2 safety/equipment  Lateral/Scoot Transfers: +2 safety/equipment, Mod assist General transfer comment: attempted sit to stand x 3 from EOB with 1 person assist; pt unable to come to full stand with reports of dizziness and feeling fearful. Reports she got to[facility] with [facility] nurse and not sure why she cannot stand up at this time (this was attempted after exercises x 4 extremities) Ambulation/Gait Ambulation/Gait assistance: Min guard Gait Distance (Feet): 3 Feet Assistive device: Rolling walker (2 wheeled) General Gait Details: pivotal hops from bed to recliner on her left    ADL: ADL Overall ADL's : Needs assistance/impaired Eating/Feeding: Independent, Bed level Grooming: Wash/dry hands, Wash/dry face, Supervision/safety, Sitting Grooming Details (indicate cue type and reason): attempted to perform standing at sink but was unable to keep balance and perform grooming. Upper Body Bathing: Minimal assistance, Bed level Lower Body Bathing: Maximal assistance, Bed level Upper Body Dressing : Minimal assistance, Bed level Lower Body Dressing: Maximal assistance, Bed level General ADL Comments: transfer performed to recliner with walker and assistance of 2  Cognition: Cognition Overall Cognitive Status: Within Functional Limits for tasks assessed Orientation Level: Oriented X4 Cognition Arousal/Alertness: Awake/alert Behavior During Therapy: WFL for tasks assessed/performed Overall Cognitive Status: Within Functional Limits  for tasks assessed General Comments: good participation   Blood pressure 132/63, pulse 82, temperature 98.2 F (36.8 C), temperature source Oral, resp. rate 17, height 5\' 2"  (1.575 m), weight (!) 155 kg, SpO2 96 %. Physical Exam Vitals and nursing note reviewed.  Constitutional:      General: She is not in acute distress.    Appearance: She is obese. She is not ill-appearing.     Comments: Sitting at the EOB with good balance. NAD.  HENT:     Head: Normocephalic and atraumatic.     Right Ear: External ear normal.     Left Ear: External ear normal.     Nose: Nose normal.  Eyes:     General:        Right eye: No discharge.        Left eye: No discharge.     Extraocular Movements: Extraocular movements intact.  Neck:     Comments:  Super super obese Cardiovascular:     Rate and Rhythm: Normal rate and regular rhythm.  Pulmonary:     Effort: Pulmonary effort is normal. No respiratory distress.  Abdominal:     General: There is no distension.     Tenderness: There is no abdominal tenderness.  Musculoskeletal:  General: Swelling present. Tenderness: Right BKA.    Cervical back: Normal range of motion and neck supple.     Comments: B/l LE edema  Skin:    General: Skin is warm and dry.     Comments: Knee high TEDs on LLE. Small round burn lesions on breast and back with yellow eschar. Umbilical area with linear laceration moist due to bacitracin? Right BKA CDI  Neurological:     Mental Status: She is alert and oriented to person, place, and time.     Comments: Alert and oriented.  Motor: LLE: HF 3--3/5, KE 4+/5, ADF 5/5 RLE: HF, KE 4-/5 (some pain inhibition)  Psychiatric:        Mood and Affect: Mood normal.        Behavior: Behavior normal.    Results for orders placed or performed during the hospital encounter of 05/26/21 (from the past 48 hour(s))  Glucose, capillary     Status: Abnormal   Collection Time: 06/06/21 12:20 PM  Result Value Ref Range    Glucose-Capillary 218 (H) 70 - 99 mg/dL    Comment: Glucose reference range applies only to samples taken after fasting for at least 8 hours.  Glucose, capillary     Status: Abnormal   Collection Time: 06/06/21  4:39 PM  Result Value Ref Range   Glucose-Capillary 114 (H) 70 - 99 mg/dL    Comment: Glucose reference range applies only to samples taken after fasting for at least 8 hours.  Glucose, capillary     Status: Abnormal   Collection Time: 06/06/21  9:52 PM  Result Value Ref Range   Glucose-Capillary 124 (H) 70 - 99 mg/dL    Comment: Glucose reference range applies only to samples taken after fasting for at least 8 hours.  Procalcitonin     Status: None   Collection Time: 06/07/21  3:03 AM  Result Value Ref Range   Procalcitonin 0.11 ng/mL    Comment:        Interpretation: PCT (Procalcitonin) <= 0.5 ng/mL: Systemic infection (sepsis) is not likely. Local bacterial infection is possible. (NOTE)       Sepsis PCT Algorithm           Lower Respiratory Tract                                      Infection PCT Algorithm    ----------------------------     ----------------------------         PCT < 0.25 ng/mL                PCT < 0.10 ng/mL          Strongly encourage             Strongly discourage   discontinuation of antibiotics    initiation of antibiotics    ----------------------------     -----------------------------       PCT 0.25 - 0.50 ng/mL            PCT 0.10 - 0.25 ng/mL               OR       >80% decrease in PCT            Discourage initiation of  antibiotics      Encourage discontinuation           of antibiotics    ----------------------------     -----------------------------         PCT >= 0.50 ng/mL              PCT 0.26 - 0.50 ng/mL               AND        <80% decrease in PCT             Encourage initiation of                                             antibiotics       Encourage continuation           of  antibiotics    ----------------------------     -----------------------------        PCT >= 0.50 ng/mL                  PCT > 0.50 ng/mL               AND         increase in PCT                  Strongly encourage                                      initiation of antibiotics    Strongly encourage escalation           of antibiotics                                     -----------------------------                                           PCT <= 0.25 ng/mL                                                 OR                                        > 80% decrease in PCT                                      Discontinue / Do not initiate                                             antibiotics  Performed at [facility], [address], [place], [place] [address]   CBC     Status: Abnormal   Collection Time:  06/07/21  3:03 AM  Result Value Ref Range   WBC 7.1 4.0 - 10.5 K/uL   RBC 3.79 (L) 3.87 - 5.11 MIL/uL   Hemoglobin 9.7 (L) 12.0 - 15.0 g/dL   HCT 63.8 (L) 46.6 - 59.9 %   MCV 86.3 80.0 - 100.0 fL   MCH 25.6 (L) 26.0 - 34.0 pg   MCHC 29.7 (L) 30.0 - 36.0 g/dL   RDW 35.7 (H) 01.7 - 79.3 %   Platelets 297 150 - 400 K/uL   nRBC 0.0 0.0 - 0.2 %    Comment: Performed at [facility], [address] [place], [place] [address]  Basic metabolic panel     Status: Abnormal   Collection Time: 06/07/21  3:03 AM  Result Value Ref Range   Sodium 136 135 - 145 mmol/L   Potassium 4.2 3.5 - 5.1 mmol/L   Chloride 100 98 - 111 mmol/L   CO2 27 22 - 32 mmol/L   Glucose, Bld 129 (H) 70 - 99 mg/dL    Comment: Glucose reference range applies only to samples taken after fasting for at least 8 hours.   BUN 14 6 - 20 mg/dL   Creatinine, Ser 9.23 0.44 - 1.00 mg/dL   Calcium 8.6 (L) 8.9 - 10.3 mg/dL   GFR, Estimated >30 >07 mL/min    Comment: (NOTE) Calculated using the CKD-EPI Creatinine Equation (2021)    Anion gap 9 5 - 15    Comment: Performed at [facility], [address] [place], [place] [address]  Glucose, capillary     Status: Abnormal   Collection Time: 06/07/21  7:42 AM  Result Value Ref Range   Glucose-Capillary 158 (H) 70 - 99 mg/dL    Comment: Glucose reference range applies only to samples taken after fasting for at least 8 hours.  Glucose, capillary     Status: Abnormal   Collection Time: 06/07/21 12:19 PM  Result Value Ref Range   Glucose-Capillary 176 (H) 70 - 99 mg/dL    Comment: Glucose reference range applies only to samples taken after fasting for at least 8 hours.  Glucose, capillary     Status: Abnormal   Collection Time: 06/07/21  4:02 PM  Result Value Ref Range   Glucose-Capillary 164 (H) 70 - 99 mg/dL    Comment: Glucose reference range applies only to samples taken after fasting for at least 8 hours.  Glucose, capillary     Status: Abnormal   Collection Time: 06/07/21  9:18 PM  Result Value Ref Range   Glucose-Capillary 194 (H) 70 - 99 mg/dL    Comment: Glucose reference range applies only to samples taken after fasting for at least 8 hours.  Procalcitonin     Status: None   Collection Time: 06/08/21  1:04 AM  Result Value Ref Range   Procalcitonin <0.10 ng/mL    Comment:        Interpretation: PCT (Procalcitonin) <= 0.5 ng/mL: Systemic infection (sepsis) is not likely. Local bacterial infection is possible. (NOTE)       Sepsis PCT Algorithm           Lower Respiratory Tract                                      Infection PCT Algorithm    ----------------------------     ----------------------------  PCT < 0.25 ng/mL                PCT < 0.10 ng/mL          Strongly encourage             Strongly discourage   discontinuation of antibiotics    initiation of antibiotics    ----------------------------     -----------------------------       PCT 0.25 - 0.50 ng/mL            PCT 0.10 - 0.25 ng/mL               OR       >80% decrease in PCT            Discourage initiation of                                             antibiotics      Encourage discontinuation           of antibiotics    ----------------------------     -----------------------------         PCT >= 0.50 ng/mL              PCT 0.26 - 0.50 ng/mL               AND        <80% decrease in PCT             Encourage initiation of                                             antibiotics       Encourage continuation           of antibiotics    ----------------------------     -----------------------------        PCT >= 0.50 ng/mL                  PCT > 0.50 ng/mL               AND         increase in PCT                  Strongly encourage                                      initiation of antibiotics    Strongly encourage escalation           of antibiotics                                     -----------------------------                                           PCT <= 0.25 ng/mL  OR                                        > 80% decrease in PCT                                      Discontinue / Do not initiate                                             antibiotics  Performed at [facility], [address], [place], [place] [address]   Glucose, capillary     Status: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

## 2021-06-08 NOTE — Discharge Summary (Signed)
[name] JAS:[id number] DOB: 15-Apr-1961 DOA: 05/26/2021  PCP: Sharlene Dory, DO  Admit date: 05/26/2021  Discharge date: 06/08/2021  Admitted From: Home   Disposition:  CIR   Recommendations for Outpatient Follow-up:   Follow up with PCP in 1-2 weeks  PCP Please obtain BMP/CBC, 2 view CXR in 1week,  (see Discharge instructions)   PCP Please follow up on the following pending results:    Home Health: None   Equipment/Devices: None  Consultations: Ortho Discharge Condition: Stable    CODE STATUS: Full    Diet Recommendation: Heart Healthy Low Carb  Diet Order             Diet Carb Modified           Diet heart healthy/carb modified Room service appropriate? Yes; Fluid consistency: Thin  Diet effective now                    Chief Complaint  Patient presents with   Wound Check     Brief history of present illness from the day of admission and additional interim summary    Patient is a 60 y.o. female with history of DM-2, CAD, HTN, MCTD, chronic right foot ulcer (refused BKA in the past as wanted to try hyperbaric therapy)-presenting with fever/RLE erythema-found to have sepsis and Enterococcus bacteremia.   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  Intraoperative TEE on 9/21 without any vegetations.  Antibiotics narrowed  to ampicillin-stop date 06/08/21 midnight.  Continues supportive Rx, PT-OT >> CIR today.   IBS diarrhea predominant: stable now.Marland Kitchen   HTN: BP stable-continue Imdur, Avapro and Cardizem.  Follow and adjust.     Chronic pain syndrome: At baseline-continue as needed oxycodone and Neurontin.  Outpatient neurosurgery follow-up post discharge.   CAD-s/p RCA PCI on 2019: No anginal symptoms-continue ASA   HLD: Continue Zetia   MCTD: Continue Plaquenil   Hypothyroidism: Continue Synthroid   GERD: Continue PPI   Restless leg syndrome: Continue Mirapex   Bronchial asthma: Not in exacerbation-continue bronchodilators   Morbid Obesity: BMI 60, follow with PCP for weight loss, needs outpt Sleep study as well.   DM-2 (A1c 7.3 on 9/16): on Lantus to 56 units, increase Premeal NovoLog to 22 units-continue SSI- check CBGs QAC-HS at CIR and adjust as  needed  Lab Results  Component Value Date   HGBA1C 7.3 (H) 05/27/2021    CBG (last 3)  Recent Labs    06/07/21 1602 06/07/21 2118 06/08/21 0751  GLUCAP 164* 194* 166*         Discharge diagnosis     Principal Problem:   Bacteremia due to Enterococcus Active Problems:   Hyperlipidemia   Mixed connective tissue disease (HCC)   Hypothyroidism   Uncomplicated asthma   Benign essential HTN   Chronic pain associated with significant psychosocial dysfunction   Gastroesophageal reflux disease   Diabetes mellitus type 2 in obese (HCC)   Cellulitis of right foot   CAD S/P percutaneous coronary angioplasty   Foot osteomyelitis (HCC)   Sepsis (HCC)    Discharge instructions    Discharge Instructions     Diet Carb Modified   Complete by: As directed    Discharge wound care:   Complete by: As directed    Negative pressure wound care - incisional   Increase activity slowly   Complete by: As directed    Negative Pressure Wound Therapy - Incisional   Complete by: As directed        Discharge Medications   Allergies as of  06/08/2021       Reactions   Fish Allergy Anaphylaxis   INCLUDES OMEGA 3 OILS   Fish Oil Anaphylaxis, Swelling   THROAT SWELLS INCLUDES FISH AS A CLASS   Omega-3 Fatty Acids Swelling   Throat swelling  Has tolerated Glucerna nutritional drinks   Crestor [rosuvastatin] Other (See Comments)   Extreme joint pain, to this & other statins   Gabapentin Anxiety   Anxiety on high doses   Lovastatin Other (See Comments)   EXTREME JOINT PAIN   Metronidazole Nausea Only, Swelling   Headache and shakes Nausea and vomiting   Red Yeast Rice [cholestin] Other (See Comments)   Muscle pain and severe joint pain.    Rotigotine Swelling   Extreme edema   Atrovent Hfa [ipratropium Bromide Hfa] Other (See Comments)   wheezing   Iodine Swelling   Red Yeast Rice Extract    Joint pain   Victoza [liraglutide]    Unsure of reaction    Enalapril Cough   Cough Cough   Suprep [na Sulfate-k Sulfate-mg Sulf] Nausea And Vomiting   Tape Other (See Comments)   Electrodes causes skin breakdown        Medication List     STOP taking these medications    doxycycline 100 MG tablet Commonly known as: VIBRA-TABS   fluconazole 150 MG tablet Commonly known as: DIFLUCAN   HUMULIN R 500 UNIT/ML injection Generic drug: insulin regular human CONCENTRATED       TAKE these medications    acetaminophen 650 MG CR tablet Commonly known as: TYLENOL Take 1,300 mg by mouth in the morning and at bedtime.   albuterol (2.5 MG/3ML) 0.083% nebulizer solution Commonly known as: PROVENTIL USE 1 VIAL IN NEBULIZER EVERY 4 HOURS AS NEEDED FOR WHEEZING OR  FOR  SHORTNESS  OF  BREATH What changed: See the new instructions.   albuterol 108 (90 Base) MCG/ACT inhaler Commonly known as: VENTOLIN HFA Inhale 2 puffs into the lungs every 4 (four) hours as needed for wheezing or shortness of breath. What changed: Another medication with the same name was changed. Make sure you understand how and when to take each.    ampicillin 2 g in sodium chloride 0.9 % 100 mL Inject 2 g into  the vein every 4 (four) hours for 3 doses.   aspirin EC 81 MG tablet Take 81 mg by mouth daily.   clotrimazole-betamethasone cream Commonly known as: Lotrisone Apply 1 application topically 2 (two) times daily.   cyclobenzaprine 10 MG tablet Commonly known as: FLEXERIL Take 0.5-1 tablets (5-10 mg total) by mouth 3 (three) times daily as needed for muscle spasms.   diltiazem 240 MG 24 hr capsule Commonly known as: Dilt-XR Take 1 capsule (240 mg total) by mouth daily.   diphenhydrAMINE 25 MG tablet Commonly known as: BENADRYL Take 25 mg by mouth daily as needed for allergies.   EPINEPHrine 0.3 mg/0.3 mL Soaj injection Commonly known as: EpiPen 2-Pak USE AS DIRECTED FOR SEVERE ALLERGIC REACTION. What changed:  how much to take how to take this when to take this reasons to take this additional instructions   esomeprazole 40 MG capsule Commonly known as: NEXIUM Take 40 mg by mouth daily as needed (Heartburn).   fluticasone 50 MCG/ACT nasal spray Commonly known as: FLONASE USE 2 SPRAY(S) IN EACH NOSTRIL ONCE DAILY AS NEEDED FOR  ALLERGIES  OR  RHINITIS What changed: See the new instructions.   furosemide 20 MG tablet Commonly known as: Lasix Take 0.5 tablets (10 mg total) by mouth daily. As needed for swelling   gabapentin 400 MG capsule Commonly known as: NEURONTIN Take 1,600 mg by mouth as directed. 400 mg in morning, 400 mg at noon, 800 mg at night   glucose blood test strip 1 each 4 (four) times daily.   hydroxychloroquine 200 MG tablet Commonly known as: PLAQUENIL Take 1 tablet (200 mg total) by mouth 2 (two) times daily.   insulin aspart 100 UNIT/ML injection Commonly known as: novoLOG Inject 22 Units into the skin 3 (three) times daily with meals.   insulin aspart 100 UNIT/ML FlexPen Commonly known as: NOVOLOG Before each meal 3 times a day, 140-199 - 2 units, 200-250 - 4 units, 251-299 - 6  units,  300-349 - 8 units,  350 or above 10 units.   insulin glargine-yfgn 100 UNIT/ML injection Commonly known as: SEMGLEE Inject 0.56 mLs (56 Units total) into the skin daily. Start taking on: June 09, 2021   INSULIN SYRINGE 1CC/29G 29G X 1/2" 1 ML Misc Commonly known as: B-D INSULIN SYRINGE Use three times daily with insulin.  DX E11.9   isosorbide mononitrate 30 MG 24 hr tablet Commonly known as: IMDUR Take 1 tablet (30 mg total) by mouth daily.   levocetirizine 5 MG tablet Commonly known as: XYZAL TAKE 1 TABLET BY MOUTH ONCE DAILY IN THE EVENING   levothyroxine 200 MCG tablet Commonly known as: SYNTHROID TAKE 1 TABLET BY MOUTH ONCE DAILY BEFORE BREAKFAST   magnesium oxide 400 MG tablet Commonly known as: MAG-OX Take 400-1,600 mg by mouth 2 (two) times daily as needed (cramps).   metFORMIN 500 MG 24 hr tablet Commonly known as: GLUCOPHAGE-XR Take 2 tablets by mouth twice daily   mupirocin ointment 2 % Commonly known as: BACTROBAN Apply 1 application topically 2 (two) times daily.   Nexlizet 180-10 MG Tabs Generic drug: Bempedoic Acid-Ezetimibe Take 1 tablet by mouth daily.   nitroGLYCERIN 0.4 MG SL tablet Commonly known as: NITROSTAT DISSOLVE ONE TABLET UNDER THE TONGUE EVERY 5 MINUTES AS NEEDED FOR CHEST PAIN.  DO NOT EXCEED A TOTAL OF 3 DOSES IN 15 MINUTES What changed: See the new instructions.   Oxycodone HCl 10 MG Tabs Take 10 mg by mouth every 8 (eight) hours  as needed (pain).   potassium bicarbonate 25 MEQ disintegrating tablet Commonly known as: K-LYTE Take 25 mEq daily with every dose of Lasix.   pramipexole 0.5 MG tablet Commonly known as: MIRAPEX Take 6 mg by mouth See admin instructions. Take 0.5 mg up to 6 times daily as needed for restless legs   promethazine 25 MG tablet Commonly known as: PHENERGAN TAKE 1 TABLET BY MOUTH EVERY 8 HOURS AS NEEDED FOR NAUSEA AND FOR VOMITING What changed:  how much to take when to take this reasons to  take this additional instructions   saccharomyces boulardii 250 MG capsule Commonly known as: FLORASTOR Take 250 mg by mouth 3 (three) times daily.   Santyl ointment Generic drug: collagenase Apply 1 application topically daily.   valsartan 160 MG tablet Commonly known as: DIOVAN Take 1 tablet (160 mg total) by mouth daily.   Vitamin D (Ergocalciferol) 1.25 MG (50000 UNIT) Caps capsule Commonly known as: DRISDOL TAKE 1 CAPSULE BY MOUTH ONCE A WEEK (EVERY  7  DAYS)  TAKE  ON  FRIDAY What changed:  how much to take how to take this when to take this               Discharge Care Instructions  (From admission, onward)           Start     Ordered   06/08/21 0000  Discharge wound care:       Comments: Negative pressure wound care - incisional   06/08/21 1106             Follow-up Information     Adonis Huguenin, NP Follow up in 1 week(s).   Specialty: Orthopedic Surgery Contact information: [address] [place] [address] [phone]                 Major procedures and Radiology Reports - PLEASE review detailed and final reports thoroughly  -        CT Foot Right Wo Contrast  Result Date: 05/26/2021 CLINICAL DATA:  Osteomyelitis, cellulitis EXAM: CT OF THE RIGHT FOOT WITHOUT CONTRAST TECHNIQUE: Multidetector CT imaging of the right foot was performed according to the standard protocol. Multiplanar CT image reconstructions were also generated. COMPARISON:  05/26/2021, 03/19/2021 FINDINGS: Bones/Joint/Cartilage Postsurgical changes are noted from prior amputation at the level of the proximal fourth and fifth metatarsals. The chronic third metatarsal fracture seen previously is again identified. There is continued irregularity along the visualized fracture line, corresponding to the area of abnormal signal on prior MRI, consistent with chronic osteomyelitis. There are no other acute or destructive bony lesions. Prominent osteoarthritis throughout  the midfoot. Ligaments Suboptimally assessed by CT. Muscles and Tendons Diffuse muscular atrophy. Soft tissues There is diffuse soft tissue swelling, greatest along the lateral aspect of the midfoot and hindfoot. Soft tissue ulceration is seen along the lateral plantar aspect overlying the fourth metatarsal amputation site. Minimal adjacent subcutaneous gas. No obvious fluid collection or abscess on this unenhanced exam. IMPRESSION: 1. Marked soft tissue swelling of the right foot consistent with cellulitis. Focal soft tissue ulceration along the plantar lateral aspect of the midfoot. 2. Evidence of chronic osteomyelitis at a prior third metatarsal fracture site. 3. No new bony abnormalities. Electronically Signed   By: Sharlet Salina M.D.   On: 05/26/2021 20:36   DG Chest Port 1 View  Result Date: 05/26/2021 CLINICAL DATA:  Sepsis, osteomyelitis EXAM: PORTABLE CHEST 1 VIEW COMPARISON:  04/06/2021 FINDINGS: Single frontal view of the chest  demonstrates a stable cardiac silhouette. Chronic central vascular congestion without airspace disease, effusion, or pneumothorax. Azygos fissure again noted. No acute bony abnormalities. IMPRESSION: 1. Chronic central vascular congestion.  No acute airspace disease. Electronically Signed   By: Sharlet Salina M.D.   On: 05/26/2021 18:28   DG Foot Complete Left  Result Date: 05/26/2021 CLINICAL DATA:  Left foot bruising over second and third digits EXAM: LEFT FOOT - COMPLETE 3+ VIEW COMPARISON:  None. FINDINGS: Frontal, oblique, lateral views of the left foot are obtained. Evaluation was limited due to patient cooperation and difficulty positioning the patient. There is a minimally displaced intra-articular fracture involving the lateral aspect of the base of the first distal phalanx. I do not see any other acute displaced fractures. Bones are osteopenic. Mild diffuse osteoarthritis. Soft tissue swelling of the forefoot. IMPRESSION: 1. Minimally displaced intra-articular  avulsion type fracture at the lateral aspect base of the first distal phalanx. 2. Mild soft tissue swelling of the forefoot. 3. Diffuse osteoarthritis. Electronically Signed   By: Sharlet Salina M.D.   On: 05/26/2021 18:31   DG Foot Complete Right  Result Date: 05/26/2021 CLINICAL DATA:  Sepsis, osteomyelitis, erythema and swelling EXAM: RIGHT FOOT COMPLETE - 3+ VIEW COMPARISON:  05/12/2021 FINDINGS: Frontal, oblique, lateral views of the right foot are obtained. Previous amputations at the base of the fourth and fifth metatarsals. Prior third metatarsal fracture is again identified. Since the prior exam, there is increased prominence of the fracture line with increased angulation. There is overlying soft tissue swelling that has progressed significantly, with soft tissue ulceration and subcutaneous gas. Overall, findings are worrisome for chronic osteomyelitis. No acute displaced fracture.  Prominent calcaneal spurs again seen. IMPRESSION: 1. Progressive soft tissue swelling and soft tissue ulceration within the midfoot, with subcutaneous gas best seen on lateral view. There is increased prominence of the fracture line at the chronic third metatarsal fracture, with increased angulation since prior study. While repeat injury could give this appearance, the findings are highly concerning for chronic osteomyelitis given previous MRI finding 03/19/2021. Electronically Signed   By: Sharlet Salina M.D.   On: 05/26/2021 18:36   DG Foot Complete Right  Result Date: 05/15/2021 CLINICAL DATA:  Right foot ulcer. Fat layer exposed. Right foot infection. EXAM: RIGHT FOOT COMPLETE - 3+ VIEW COMPARISON:  Foot radiograph 02/22/2021.  MRI 03/19/2021 FINDINGS: Previous transmetatarsal amputation of the fourth and fifth rays. Previous soft tissue ulcer overlying the lateral foot is less prominent than on prior exam. No radiographic bony destruction of the in-situ fourth metatarsal. Remote fracture of the proximal third  metatarsal with chronic periosteal thickening along the metatarsal shaft. No acute fracture. Plantar calcaneal spur and Achilles tendon enthesophyte. Dorsal soft tissue edema. IMPRESSION: 1. No radiographic evidence of osteomyelitis. Previous soft tissue ulcer overlying the lateral foot is less prominent than on prior exam. 2. Remote fracture of the proximal third metatarsal. Electronically Signed   By: Narda Rutherford M.D.   On: 05/15/2021 21:32   ECHOCARDIOGRAM COMPLETE  Result Date: 05/29/2021    ECHOCARDIOGRAM REPORT   Patient Name:   [name] Date of Exam: 05/29/2021 Medical Rec #:  [id number]      Height:       62.0 in Accession #:    [id number]     Weight:       330.0 lb Date of Birth:  Oct 08, 1960     BSA:          2.365 m Patient Age:  59 years       BP:           120/53 mmHg Patient Gender: F              HR:           89 bpm. Exam Location:  Inpatient Procedure: 2D Echo, Cardiac Doppler, Color Doppler and Intracardiac            Opacification Agent Indications:    Bacteremia  History:        Patient has prior history of Echocardiogram examinations, most                 recent 03/04/2021. Aortic Valve Disease, Arrythmias:Atrial                 Fibrillation; Risk Factors:Hypertension, Diabetes and                 Dyslipidemia.  Sonographer:    Ross Ludwig RDCS (AE) Referring Phys: [id number] [facility] T VU  Sonographer Comments: Technically challenging study due to limited acoustic windows, Technically difficult study due to poor echo windows, suboptimal parasternal window, suboptimal apical window, suboptimal subcostal window and patient is morbidly obese.  Image acquisition challenging due to patient body habitus. IMPRESSIONS  1. An aortic valve gradient was measured. The valve cannot be seen well enough to make any further comments.  2. Technically difficult echo with poor image quality. Using Definity contrast, the LV systolic function can be seen to be well preserved.  3. Left ventricular ejection  fraction, by estimation, is 55 to 60%. The left ventricle has normal function. Left ventricular endocardial border not optimally defined to evaluate regional wall motion. Left ventricular diastolic function could not be evaluated.  4. Right ventricular systolic function was not well visualized. The right ventricular size is not well visualized.  5. The mitral valve was not well visualized. No evidence of mitral valve regurgitation.  6. The aortic valve was not well visualized. Aortic valve regurgitation is not visualized. Mild to moderate aortic valve stenosis. FINDINGS  Left Ventricle: Left ventricular ejection fraction, by estimation, is 55 to 60%. The left ventricle has normal function. Left ventricular endocardial border not optimally defined to evaluate regional wall motion. Definity contrast agent was given IV to delineate the left ventricular endocardial borders. The left ventricular internal cavity size was normal in size. There is no left ventricular hypertrophy. Left ventricular diastolic function could not be evaluated. Right Ventricle: The right ventricular size is not well visualized. Right vetricular wall thickness was not well visualized. Right ventricular systolic function was not well visualized. Left Atrium: Left atrial size was not well visualized. Right Atrium: Right atrial size was not well visualized. Pericardium: The pericardium was not well visualized. Mitral Valve: The mitral valve was not well visualized. No evidence of mitral valve regurgitation. Tricuspid Valve: The tricuspid valve is not well visualized. Tricuspid valve regurgitation is not demonstrated. Aortic Valve: The aortic valve was not well visualized. Aortic valve regurgitation is not visualized. Mild to moderate aortic stenosis is present. Aortic valve mean gradient measures 16.0 mmHg. Aortic valve peak gradient measures 32.3 mmHg. Aortic valve area, by VTI measures 1.83 cm. Pulmonic Valve: The pulmonic valve was not well  visualized. Pulmonic valve regurgitation is not visualized. Aorta: The aortic root was not well visualized. IAS/Shunts: The interatrial septum was not well visualized. Additional Comments: Technically difficult echo with poor image quality. Using Definity contrast, the LV systolic function can be seen to be well  preserved. An aortic valve gradient was measured. The valve cannot be seen well enough to make any further comments.  LEFT VENTRICLE PLAX 2D LVIDd:         5.40 cm  Diastology LVIDs:         3.50 cm  LV e' medial:    14.70 cm/s LV PW:         1.70 cm  LV E/e' medial:  8.0 LV IVS:        1.30 cm  LV e' lateral:   10.50 cm/s LVOT diam:     2.20 cm  LV E/e' lateral: 11.1 LV SV:         94 LV SV Index:   40 LVOT Area:     3.80 cm  IVC IVC diam: 1.90 cm LEFT ATRIUM           Index LA diam:      2.80 cm 1.18 cm/m LA Vol (A4C): 47.1 ml 19.91 ml/m  AORTIC VALVE AV Area (Vmax):    2.01 cm AV Area (Vmean):   2.09 cm AV Area (VTI):     1.83 cm AV Vmax:           284.00 cm/s AV Vmean:          189.000 cm/s AV VTI:            0.513 m AV Peak Grad:      32.3 mmHg AV Mean Grad:      16.0 mmHg LVOT Vmax:         150.00 cm/s LVOT Vmean:        104.000 cm/s LVOT VTI:          0.247 m LVOT/AV VTI ratio: 0.48  AORTA Ao Root diam: 3.10 cm MITRAL VALVE MV Area (PHT): 4.63 cm     SHUNTS MV Decel Time: 164 msec     Systemic VTI:  0.25 m MV E velocity: 117.00 cm/s  Systemic Diam: 2.20 cm MV A velocity: 108.00 cm/s MV E/A ratio:  1.08 Kristeen Miss MD Electronically signed by Kristeen Miss MD Signature Date/Time: 05/29/2021/11:46:02 AM    Final    ECHO TEE  Result Date: 06/01/2021    TRANSESOPHOGEAL ECHO REPORT   Patient Name:   [name] Date of Exam: 06/01/2021 Medical Rec #:  [id number]      Height:       62.0 in Accession #:    [id number]     Weight:       330.0 lb Date of Birth:  June 28, 1961     BSA:          2.365 m Patient Age:    59 years       BP:           134/81 mmHg Patient Gender: F              HR:            72 bpm. Exam Location:  Inpatient Procedure: Transesophageal Echo, Color Doppler and Cardiac Doppler Indications:     Bacteremia  History:         Patient has prior history of Echocardiogram examinations, most                  recent 05/29/2021. CHF, CAD; Risk Factors:Dyslipidemia,                  Hypertension and Diabetes.  Sonographer:     Eulah Pont RDCS Referring  Phys:  [id number] Corrin Parker Diagnosing Phys: Lennie Odor MD PROCEDURE: After discussion of the risks and benefits of a TEE, an informed consent was obtained from the patient. The transesophogeal probe was passed without difficulty through the esophogus of the patient. Sedation performed by different physician. The patient was monitored while under deep sedation. The patient's vital signs; including heart rate, blood pressure, and oxygen saturation; remained stable throughout the procedure. The patient developed no complications during the procedure. IMPRESSIONS  1. Left ventricular ejection fraction, by estimation, is 60 to 65%. The left ventricle has normal function. The left ventricle has no regional wall motion abnormalities.  2. Right ventricular systolic function is normal. The right ventricular size is normal.  3. No left atrial/left atrial appendage thrombus was detected.  4. The mitral valve is grossly normal. Trivial mitral valve regurgitation. No evidence of mitral stenosis.  5. The aortic valve is tricuspid. Aortic valve regurgitation is not visualized. Mild aortic valve sclerosis is present, with no evidence of aortic valve stenosis.  6. There is mild (Grade II) layered plaque involving the descending aorta and transverse aorta. Conclusion(s)/Recommendation(s): No evidence of vegetation/infective endocarditis on this transesophageal echocardiogram. FINDINGS  Left Ventricle: Left ventricular ejection fraction, by estimation, is 60 to 65%. The left ventricle has normal function. The left ventricle has no regional wall motion  abnormalities. The left ventricular internal cavity size was normal in size. Right Ventricle: The right ventricular size is normal. No increase in right ventricular wall thickness. Right ventricular systolic function is normal. Left Atrium: Left atrial size was normal in size. No left atrial/left atrial appendage thrombus was detected. Right Atrium: Right atrial size was normal in size. Pericardium: Trivial pericardial effusion is present. Mitral Valve: The mitral valve is grossly normal. Trivial mitral valve regurgitation. No evidence of mitral valve stenosis. There is no evidence of mitral valve vegetation. Tricuspid Valve: The tricuspid valve is grossly normal. Tricuspid valve regurgitation is trivial. No evidence of tricuspid stenosis. There is no evidence of tricuspid valve vegetation. Aortic Valve: The aortic valve is tricuspid. Aortic valve regurgitation is not visualized. Mild aortic valve sclerosis is present, with no evidence of aortic valve stenosis. There is no evidence of aortic valve vegetation. Pulmonic Valve: The pulmonic valve was grossly normal. Pulmonic valve regurgitation is not visualized. No evidence of pulmonic stenosis. There is no evidence of pulmonic valve vegetation. Aorta: The aortic root and ascending aorta are structurally normal, with no evidence of dilitation. There is mild (Grade II) layered plaque involving the descending aorta and transverse aorta. Venous: The right upper pulmonary vein, right lower pulmonary vein, left lower pulmonary vein and left upper pulmonary vein are normal. IAS/Shunts: The interatrial septum appears to be lipomatous. No atrial level shunt detected by color flow Doppler.   AORTA Ao Root diam: 2.72 cm Ao Asc diam:  3.20 cm Lennie Odor MD Electronically signed by Lennie Odor MD Signature Date/Time: 06/01/2021/4:43:32 PM    Final       Today   Subjective    [name] today has no headache,no chest abdominal pain,no new weakness tingling or  numbness, feels much better.   Objective   Blood pressure 132/63, pulse 82, temperature 98.2 F (36.8 C), temperature source Oral, resp. rate 17, height  (1.575 m), weight (!) 155 kg, SpO2 96 %.   Intake/Output Summary (Last 24 hours) at 06/08/2021 1106 Last data filed at 06/08/2021 1000 Gross per 24 hour  Intake 360 ml  Output --  Net 360  ml    Exam  Awake Alert, No new F.N deficits, Normal affect [place].AT,PERRAL Supple Neck,No JVD, No cervical lymphadenopathy appriciated.  Symmetrical Chest wall movement, Good air movement bilaterally, CTAB RRR,No Gallops,Rubs or new Murmurs, No Parasternal Heave +ve B.Sounds, Abd Soft, Non tender, No organomegaly appriciated, No rebound -guarding or rigidity. R BKA stump under bandage/wrap.   Data Review   CBC w Diff:  Lab Results  Component Value Date   WBC 7.1 06/07/2021   HGB 9.7 (L) 06/07/2021   HGB 10.5 (L) 05/04/2020   HCT 32.7 (L) 06/07/2021   HCT 35.4 05/04/2020   PLT 297 06/07/2021   PLT 292 05/04/2020   LYMPHOPCT 5 05/26/2021   BANDSPCT 6 04/28/2016   MONOPCT 4 05/26/2021   EOSPCT 0 05/26/2021   BASOPCT 0 05/26/2021    CMP:  Lab Results  Component Value Date   NA 136 06/07/2021   NA 138 05/04/2020   K 4.2 06/07/2021   CL 100 06/07/2021   CO2 27 06/07/2021   BUN 14 06/07/2021   BUN 14 05/04/2020   CREATININE 0.96 06/07/2021   PROT 6.1 (L) 05/28/2021   PROT 6.5 05/04/2020   ALBUMIN 2.9 (L) 05/28/2021   ALBUMIN 4.0 05/04/2020   BILITOT 1.1 05/28/2021   BILITOT <0.2 05/04/2020   ALKPHOS 37 (L) 05/28/2021   AST 76 (H) 05/28/2021   ALT 36 05/28/2021  .   Total Time in preparing paper work, data evaluation and todays exam - 35 minutes  Susa Raring M.D on 06/08/2021 at 11:06 AM  [facility]

## 2021-06-08 NOTE — Progress Notes (Signed)
Occupational Therapy Treatment Patient Details Name: [name] MRN: [id number] DOB: August 15, 1961 Today's Date: 06/08/2021   History of present illness Pt is a 60 y.o. female admitted 05/26/21 with fever and RLE erythema; found to have sepsis due to RLE cellulitis/diabetic foot with Enterococcus bacteremia. 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 PMH includes asthma, CHF, CAD, HLD, HTN, DM2, obesity, connective tissue disease, chronic pain, R toe amputations (01/2020).   OT comments  Patient received sitting on eob and states she is expected to move to CIR today. Patient attempted to stand from eob but had pain in RLE. Reviewed HEP and performed one set of each exercise to increase carryover.  Patient demonstrated good understanding of HEP.   Recommendations for follow up therapy are one component of a multi-disciplinary discharge planning process, led by the attending physician.  Recommendations may be updated based on patient status, additional functional criteria and insurance authorization.    Follow Up Recommendations  CIR    Equipment Recommendations  Wheelchair (measurements OT);Wheelchair cushion (measurements OT);Tub/shower bench    Recommendations for Other Services      Precautions / Restrictions Precautions Precautions: Fall Required Braces or Orthoses: Other Brace Other Brace: limb protector Restrictions Weight Bearing Restrictions: Yes RLE Weight Bearing: Non weight bearing       Mobility Bed Mobility               General bed mobility comments: Patient sitting on eob    Transfers Overall transfer level: Needs assistance Equipment used: Rolling walker (2 wheeled) Transfers: Sit to/from Stand Sit to Stand: Min assist;+2 physical assistance;+2 safety/equipment Stand pivot transfers: Min assist;+2 safety/equipment       General transfer comment: pt on BSC on arrival; required 2 person assist to stand from [facility], recliner and bed (x4 total)    Balance Overall  balance assessment: Needs assistance Sitting-balance support: No upper extremity supported;Feet unsupported Sitting balance-[name]: Good Sitting balance - Comments: able to perform UE HEP while seated on eob   Standing balance support: Bilateral upper extremity supported Standing balance-[name]: Poor Standing balance comment: bil UE support needed in standing; stood for max 30 seconds (limited by back pain)                           ADL either performed or assessed with clinical judgement   ADL                                               Vision       Perception     Praxis      Cognition Arousal/Alertness: Awake/alert Behavior During Therapy: WFL for tasks assessed/performed Overall Cognitive Status: Impaired/Different from baseline Area of Impairment: Safety/judgement                         Safety/Judgement: Decreased awareness of safety;Decreased awareness of deficits     General Comments: Patient reports she will be moving to CIR today        Exercises Exercises: General Upper Extremity General Exercises - Upper Extremity Shoulder Flexion: Strengthening;Both;10 reps;Seated;Theraband Theraband Level (Shoulder Flexion): Level 1 (Yellow) Shoulder ABduction: Strengthening;Both;10 reps;Seated;Theraband Theraband Level (Shoulder Abduction): Level 1 (Yellow) Elbow Flexion: Strengthening;Both;10 reps;Seated;Theraband Theraband Level (Elbow Flexion): Level 1 (Yellow) Elbow Extension: Strengthening;Both;10 reps;Seated;Theraband   Shoulder Instructions  General Comments Pt reports she already did her LE and UE exercises this morning.    Pertinent Vitals/ Pain       Pain Assessment: Faces Faces Pain Scale: Hurts little more Pain Location: back Pain Descriptors / Indicators: Guarding;Discomfort;Aching Pain Intervention(s): Limited activity within patient's tolerance;Monitored during session  Home Living                                           Prior Functioning/Environment              Frequency  Min 2X/week        Progress Toward Goals  OT Goals(current goals can now be found in the care plan section)  Progress towards OT goals: Progressing toward goals  Acute Rehab OT Goals Patient Stated Goal: I want to do more and get stronger OT Goal Formulation: With patient Time For Goal Achievement: 06/17/21 Potential to Achieve Goals: Good ADL Goals Pt Will Perform Grooming: with set-up;sitting Pt Will Perform Upper Body Bathing: with set-up;sitting Pt Will Perform Upper Body Dressing: with set-up;sitting Pt Will Transfer to Toilet: stand pivot transfer;bedside commode;with mod assist;with +2 assist Pt Will Perform Toileting - Clothing Manipulation and hygiene: with min assist;sitting/lateral leans Pt/caregiver will Perform Home Exercise Program: Increased strength;Both right and left upper extremity;With theraband;With Supervision;With written HEP provided  Plan Discharge plan remains appropriate    Co-evaluation                 AM-PAC OT "6 Clicks" Daily Activity     Outcome Measure   Help from another person eating meals?: None Help from another person taking care of personal grooming?: None Help from another person toileting, which includes using toliet, bedpan, or urinal?: A Lot Help from another person bathing (including washing, rinsing, drying)?: A Lot Help from another person to put on and taking off regular upper body clothing?: A Little Help from another person to put on and taking off regular lower body clothing?: A Lot 6 Click Score: 17    End of Session    OT Visit Diagnosis: Unsteadiness on feet (R26.81);Muscle weakness (generalized) (M62.81);Pain Pain - Right/Left: Right Pain - part of body: Leg   Activity Tolerance Patient tolerated treatment well   Patient Left with call bell/phone within reach;in bed   Nurse Communication Other  (comment)        Time: 2956-2130 OT Time Calculation (min): 15 min  Charges: OT General Charges $OT Visit: 1 Visit OT Treatments $Therapeutic Exercise: 8-22 mins  Alfonse Flavors, OTA   [place] Jeannett Senior 06/08/2021, 11:20 AM

## 2021-06-08 NOTE — Progress Notes (Signed)
Physical Therapy Treatment Patient Details Name: [name] MRN: [id number] DOB: July 20, 1961 Today's Date: 06/08/2021   History of Present Illness Pt is a 60 y.o. female admitted 05/26/21 with fever and RLE erythema; found to have sepsis due to RLE cellulitis/diabetic foot with Enterococcus bacteremia. 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 PMH includes asthma, CHF, CAD, HLD, HTN, DM2, obesity, connective tissue disease, chronic pain, R toe amputations (01/2020).    PT Comments    Session focused on sit to stand transfers and ambulation (although pt most successful with "shimmying" on her left foot in standing). Pt currently requiring +2 min assist (physical assist and for safety when moving between surfaces). She had already done her exercises this a.m. Discussed possibly adding single leg bridging, however pt felt certain that this would cause incr pain in her back and deferred.     Recommendations for follow up therapy are one component of a multi-disciplinary discharge planning process, led by the attending physician.  Recommendations may be updated based on patient status, additional functional criteria and insurance authorization.  Follow Up Recommendations  CIR     Equipment Recommendations  Other (comment);Wheelchair (measurements PT);Wheelchair cushion (measurements PT) (drop-arm BSC, slide board)    Recommendations for Other Services       Precautions / Restrictions Precautions Precautions: Fall Required Braces or Orthoses: Other Brace Other Brace: limb protector Restrictions Weight Bearing Restrictions: Yes RLE Weight Bearing: Non weight bearing     Mobility  Bed Mobility               General bed mobility comments: reiterated need to keep RLE elevated and knee extended if sitting EOB (pt prefers not to wear limb protector if in bed and not transferring)    Transfers Overall transfer level: Needs assistance Equipment used: Rolling walker (2 wheeled) Transfers: Sit  to/from Stand Sit to Stand: Min assist;+2 physical assistance;+2 safety/equipment Stand pivot transfers: Min assist;+2 safety/equipment       General transfer comment: pt on BSC on arrival; required 2 person assist to stand from [name], recliner and bed (x4 total)  Ambulation/Gait Ambulation/Gait assistance: Min guard Gait Distance (Feet): 2 Feet Assistive device: Rolling walker (2 wheeled)       General Gait Details: several hops and then shimmied on her left foot to cover ~1 foot to and then from recliner; again shimmied x 1 ft toward [name] Wheelchair mobility:  (discussed that she will need to learn to use wheelchair as do not think she will be able to ambulate functional distances with LLE weakness and sciatica)  Modified Rankin (Stroke Patients Only)       Balance Overall balance assessment: Needs assistance Sitting-balance support: No upper extremity supported;Feet unsupported Sitting balance-[name]: Good Sitting balance - Comments: Difficulty reaching bilateral feet sitting EOB   Standing balance support: Bilateral upper extremity supported Standing balance-[name]: Poor Standing balance comment: bil UE support needed in standing; stood for max 30 seconds (limited by back pain)                            Cognition Arousal/Alertness: Awake/alert Behavior During Therapy: WFL for tasks assessed/performed Overall Cognitive Status: Impaired/Different from baseline Area of Impairment: Safety/judgement                         Safety/Judgement:  Decreased awareness of safety;Decreased awareness of deficits     General Comments: pt reports fell last night getting off BSC with husband's assist; reports full fall to the floor      Exercises      General Comments General comments (skin integrity, edema, etc.): Pt reports she already did her LE and UE exercises this morning.       Pertinent Vitals/Pain Pain Assessment: Faces Faces Pain Scale: Hurts little more Pain Location: back Pain Descriptors / Indicators: Guarding;Discomfort;Aching Pain Intervention(s): Limited activity within patient's tolerance;Monitored during session    Home Living                      Prior Function            PT Goals (current goals can now be found in the care plan section) Acute Rehab PT Goals Patient Stated Goal: I want to do more and get stronger Time For Goal Achievement: 06/16/21 Potential to Achieve Goals: Good Progress towards PT goals: Progressing toward goals    Frequency    Min 4X/week      PT Plan Current plan remains appropriate    Co-evaluation              AM-PAC PT "6 Clicks" Mobility   Outcome Measure  Help needed turning from your back to your side while in a flat bed without using bedrails?: A Little Help needed moving from lying on your back to sitting on the side of a flat bed without using bedrails?: A Little Help needed moving to and from a bed to a chair (including a wheelchair)?: Total Help needed standing up from a chair using your arms (e.g., wheelchair or bedside chair)?: Total Help needed to walk in hospital room?: Total Help needed climbing 3-5 steps with a railing? : Total 6 Click Score: 10    End of Session Equipment Utilized During Treatment: Gait belt Activity Tolerance: Patient limited by fatigue Patient left: with call bell/phone within reach;in bed Nurse Communication: Mobility status;Other (comment) (pt reports fall last night with husband assisting her) PT Visit Diagnosis: Other abnormalities of gait and mobility (R26.89);Muscle weakness (generalized) (M62.81);Pain Pain - Right/Left: Right Pain - part of body: Leg     Time: 4665-9935 PT Time Calculation (min) (ACUTE ONLY): 35 min  Charges:  $Therapeutic Activity: 23-37 mins                      [name], PT Pager [phone]    Zena Amos 06/08/2021, 10:20 AM

## 2021-06-08 NOTE — Progress Notes (Signed)
Jamse Arn, MD   Physician  Physical Medicine and Rehabilitation  PMR Pre-admission     Signed  Date of Service:  06/07/2021  3:26 PM       Related encounter: ED to Hosp-Admission (Current) from 05/26/2021 in [facility]       Signed      Show:Clear all _0 Written_1 Templated_2 Copied  Added by: _3 Cristina Gong, RN_4 Jamse Arn, MD  _5 Hover for details                                                                                                                                                                                                                                                                                                                                                                                                                            PMR Admission Coordinator Pre-Admission Assessment   Patient: [name] is an 60 y.o., female MRN: [id number] DOB: 1961-01-22 Height: _6  (157.5 cm) Weight: (!) 155 kg   Insurance Information HMO:     PPO:      PCP:      IPA:      80/20:      OTHER:  PRIMARY: [place]      Policy#: [id number]      Subscriber: pt CM Name: Hilda Blades      Phone#: [phone]     Fax#: [phone] Pre-Cert#: [id number] for 7 days      Employer:  Benefits:  Phone #: [phone]     Name: 9/23 Eff. Date: 09/12/2019     Deduct: none      Out of Pocket Max: $8700      Life Max: none CIR: $3000 co pay per day days for 2 days      SNF: 50% Outpatient: $100 per visit     Co-Pay: visits per medical neccesity Home Health: 50%      Co-Pay: visits per medical neccesity DME: 50%     Co-Pay: DME rental only Providers: in network  SECONDARY: none         Financial Counselor:       Phone#:    [employer]" for patients in Inpatient  Rehabilitation Facilities with attached "Privacy Act [place] Records" was provided and verbally reviewed with: N/A   Emergency Contact Information Contact Information       Name Relation Home Work Mobile    Hamed,Mike Spouse [phone]   [phone]    Morton Peters. Roxan Diesel     [phone]           Current Medical History  Patient Admitting Diagnosis: BKA   History of Present Illness: 60 year old female with medical history of chronic pain, type 2 DM, CAD s/p PTCA, HLD, mixed connective tissue disease, hypothyroidism, asthma, GERD, and chronic right foot ulcer. Presented on 05/26/2021 with fever to 104 and concerns for sepsis due to foot infection.    Found to have sepsis and enterococcus bacteremia from RLE cellulitis/superimposed on chronic diabetic foot ulcer with underlying osteomyelitis. 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 Intraoperative TEE without any vegetation. Antibiotics narrowed to ampicillin with stop date of 06/08/21. Postoperative pain with medications adjusted. IBS diarrhea pta and developing some constipation due to narcotics. BP stable on Imdur, Avapro and Cardizem.    Patient[facility] from [facility] has been reviewed by the rehabilitation admission coordinator and physician.   Past Medical History      Past Medical History:  Diagnosis Date   Aortic stenosis      mild AS by echo 02/2021   Asthma      "on daily RX and rescue inhaler" (04/18/2018)   Chronic diastolic CHF (congestive heart failure) ([place]) 09/2015   Chronic lower back pain     Chronic neck pain     Chronic pain syndrome      Fentanyl Patch   Colon polyps     Coronary artery disease      cath with normal LM, 30% LAD, 85% mid RCA and 95% distal RCA s/p PCI of the mid to distal RCA and now on DAPT with ASA and Ticagrelor.     Eczema     Excessive daytime sleepiness 11/26/2015   Fibromyalgia     Gallstones     GERD (gastroesophageal reflux disease)     Heart murmur      "noted for  the 1st time on 04/18/2018"   History of blood transfusion 07/2010    "S/P oophorectomy"   History of gout     History of hiatal hernia 1980s    "gone now" (04/18/2018)   Hyperlipidemia     Hypertension      takes Metoprolol and Enalapril daily   Hypothyroidism      takes Synthroid daily   IBS (irritable bowel syndrome)     Migraine      "nothing in the 2000s" (04/18/2018)   Mixed connective tissue disease ([place])     NAFLD (nonalcoholic  fatty liver disease)     Pneumonia      "several times" (04/18/2018)   PVC's (premature ventricular contractions)      noted on event monitor 02/2021   Rheumatoid arthritis ([place])      "hands, elbows, shoulders, probably knees" (04/18/2018)   Scoliosis     Spondylosis     Type II diabetes mellitus ([place])      takes Metformin and Hum R daily (04/18/2018)      Has the patient had major surgery during 100 days prior to admission? Yes   Family History   family history includes Allergic rhinitis in her father; Asthma in her father and son; Breast cancer in her paternal aunt; CAD in her father and mother; Heart attack in her father and mother; Hypertension in her brother, brother, and mother; Lung cancer in her paternal aunt; Pancreatic cancer in her paternal aunt.   Current Medications   Current Facility-Administered Medications:    acetaminophen (TYLENOL) tablet 1,000 mg, 1,000 mg, Oral, Q8H, 1,000 mg at 06/08/21 0502 **OR** [DISCONTINUED] acetaminophen (TYLENOL) suppository 650 mg, 650 mg, Rectal, Q6H PRN, Orma Flaming, MD   albuterol (PROVENTIL) (2.5 MG/3ML) 0.083% nebulizer solution 2.5 mg, 2.5 mg, Nebulization, Q4H PRN, Persons, Bevely Palmer, PA, 2.5 mg at 06/06/21 0729   ampicillin (OMNIPEN) 2 g in sodium chloride 0.9 % 100 mL IVPB, 2 g, Intravenous, Q4H, [name] [place], DO, Last Rate: 300 mL/hr at 06/08/21 0843, 2 g at 06/08/21 0843   aspirin EC tablet 81 mg, 81 mg, Oral, Daily, Persons, Bevely Palmer, PA, 81 mg at 06/08/21 1023   bisacodyl (DULCOLAX)  suppository 10 mg, 10 mg, Rectal, Daily PRN, Ghimire, Henreitta Leber, MD   cyclobenzaprine (FLEXERIL) tablet 10 mg, 10 mg, Oral, TID, Thurnell Lose, MD, 10 mg at 06/08/21 1023   diltiazem (CARDIZEM CD) 24 hr capsule 240 mg, 240 mg, Oral, Daily, Persons, Bevely Palmer, PA, 240 mg at 06/08/21 1024   enoxaparin (LOVENOX) injection 75 mg, 0.5 mg/kg, Subcutaneous, Daily, Persons, Bevely Palmer, PA, 75 mg at 06/08/21 1022   ezetimibe (ZETIA) tablet 10 mg, 10 mg, Oral, Daily, Persons, Bevely Palmer, PA, 10 mg at 06/08/21 1024   fluticasone (FLONASE) 50 MCG/ACT nasal spray 2 spray, 2 spray, Each Nare, Daily PRN, Persons, Bevely Palmer, PA   gabapentin (NEURONTIN) capsule 400 mg, 400 mg, Oral, BID WC, Persons, Bevely Palmer, PA, 400 mg at 06/08/21 7711   gabapentin (NEURONTIN) capsule 800 mg, 800 mg, Oral, QHS, Persons, Bevely Palmer, PA, 800 mg at 06/07/21 2232   hydrocortisone cream 1 % 1 application, 1 application, Topical, TID PRN, Jonetta Osgood, MD, 1 application at 65/79/03 2223   HYDROmorphone (DILAUDID) tablet 1 mg, 1 mg, Oral, Q4H PRN, Thurnell Lose, MD, 1 mg at 06/07/21 2022   hydroxychloroquine (PLAQUENIL) tablet 200 mg, 200 mg, Oral, BID, Persons, Bevely Palmer, PA, 200 mg at 06/08/21 1024   insulin aspart (novoLOG) injection 0-20 Units, 0-20 Units, Subcutaneous, TID WC, Persons, Bevely Palmer, PA, 4 Units at 06/08/21 0839   insulin aspart (novoLOG) injection 0-5 Units, 0-5 Units, Subcutaneous, QHS, Persons, Bevely Palmer, [place], 2 Units at 06/05/21 2219   insulin aspart (novoLOG) injection 22 Units, 22 Units, Subcutaneous, TID WC, Ghimire, Henreitta Leber, MD, 22 Units at 06/08/21 0839   insulin glargine-yfgn (SEMGLEE) injection 56 Units, 56 Units, Subcutaneous, Daily, Ghimire, Henreitta Leber, MD, 56 Units at 06/08/21 1022   irbesartan (AVAPRO) tablet 150 mg, 150 mg, Oral, Daily, Persons, Bevely Palmer, PA, 150 mg  at 06/08/21 1024   isosorbide mononitrate (IMDUR) 24 hr tablet 30 mg, 30 mg, Oral, Daily, Persons, Bevely Palmer, PA, 30 mg at  06/08/21 1023   levothyroxine (SYNTHROID) tablet 200 mcg, 200 mcg, Oral, QAC breakfast, Persons, Bevely Palmer, PA, 200 mcg at 06/08/21 0502   loratadine (CLARITIN) tablet 10 mg, 10 mg, Oral, QHS, Persons, Bevely Palmer, PA, 10 mg at 06/07/21 2232   magnesium oxide (MAG-OX) tablet 400 mg, 400 mg, Oral, BID PRN, Persons, Bevely Palmer, PA   multivitamin with minerals tablet 1 tablet, 1 tablet, Oral, Daily, Persons, Bevely Palmer, PA, 1 tablet at 06/08/21 1024   mupirocin ointment (BACTROBAN) 2 %, , Topical, TID, Persons, Bevely Palmer, [place], Given at 06/08/21 1026   nitroGLYCERIN (NITROSTAT) SL tablet 0.4 mg, 0.4 mg, Sublingual, Q5 min PRN, Persons, Bevely Palmer, PA   oxyCODONE (Oxy IR/ROXICODONE) immediate release tablet 20 mg, 20 mg, Oral, Q6H PRN, Jonetta Osgood, MD, 20 mg at 06/08/21 1023   pantoprazole (PROTONIX) EC tablet 40 mg, 40 mg, Oral, Daily PRN, Persons, Bevely Palmer, PA   phenol (CHLORASEPTIC) mouth spray 1 spray, 1 spray, Mouth/Throat, PRN, Ghimire, Henreitta Leber, MD, 1 spray at 06/01/21 1940   polyethylene glycol (MIRALAX / GLYCOLAX) packet 17 g, 17 g, Oral, BID, Ghimire, Shanker M, MD, 17 g at 06/06/21 1041   pramipexole (MIRAPEX) tablet 0.5 mg, 0.5 mg, Oral, Q4H PRN, Persons, Bevely Palmer, PA, 0.5 mg at 06/05/21 2218   pramipexole (MIRAPEX) tablet 1 mg, 1 mg, Oral, BID, Persons, Bevely Palmer, PA, 1 mg at 06/08/21 1025   promethazine (PHENERGAN) tablet 25 mg, 25 mg, Oral, Q8H PRN, Persons, Bevely Palmer, PA, 25 mg at 06/04/21 2031   saccharomyces boulardii (FLORASTOR) capsule 250 mg, 250 mg, Oral, BID, Persons, Bevely Palmer, PA, 250 mg at 06/08/21 1023   senna (SENOKOT) tablet 17.2 mg, 2 tablet, Oral, QHS, Ghimire, Henreitta Leber, MD, 17.2 mg at 06/04/21 2123   Vitamin D (Ergocalciferol) (DRISDOL) capsule 50,000 Units, 50,000 Units, Oral, Q7 days, Persons, Bevely Palmer, [place], 50,000 Units at 06/03/21 2232   Patients Current Diet:  Diet Order                  Diet heart healthy/carb modified Room service appropriate? Yes; Fluid  consistency: Thin  Diet effective now                       Precautions / Restrictions Precautions Precautions: Fall Precaution Comments: body habitus Other Brace: limb protector Restrictions Weight Bearing Restrictions: Yes RLE Weight Bearing: Non weight bearing    Has the patient had 2 or more falls or a fall with injury in the past year? No   Prior Activity Level Limited Community (1-2x/wk): Independent household   Prior Functional Level Self Care: Did the patient need help bathing, dressing, using the toilet or eating? Independent   Indoor Mobility: Did the patient need assistance with walking from room to room (with or without device)? Independent   Stairs: Did the patient need assistance with internal or external stairs (with or without device)? Independent   Functional Cognition: Did the patient need help planning regular tasks such as shopping or remembering to take medications? Independent   Patient Information Are you of Hispanic, Latino/a,or Spanish origin?: A. No, not of Hispanic, Latino/a, or Spanish origin What is your race?: A. White Do you need or want an interpreter to communicate with a doctor or health care staff?: 0. No   Patient's Response To:  Health Literacy and Transportation Is the patient able to respond to health literacy and transportation needs?: Yes Health Literacy - How often do you need to have someone help you when you read instructions, pamphlets, or other written material from your doctor or pharmacy?: Never In the past 12 months, has lack of transportation kept you from medical appointments or from getting medications?: No In the past 12 months, has lack of transportation kept you from meetings, work, or from getting things needed for daily living?: No   Home Assistive Devices / [place] Devices/Equipment: Environmental consultant (specify type), CBG Meter, Blood pressure cuff, Shower chair without back, Eyeglasses, Bedside  commode/3-in-1 Home Equipment: Tub bench, Bedside commode, Walker - 4 wheels, Walker - 2 wheels, Wheelchair - manual   Prior Device Use: Indicate devices/aids used by the patient prior to current illness, exacerbation or injury? None in household   Current Functional Level Cognition   Overall Cognitive Status: Impaired/Different from baseline Orientation Level: Oriented X4 Safety/Judgement: Decreased awareness of safety, Decreased awareness of deficits General Comments: pt reports fell last night getting off BSC with husband's assist; reports full fall to the floor    Extremity Assessment (includes Sensation/Coordination)   Upper Extremity Assessment: Defer to OT evaluation  Lower Extremity Assessment: Generalized weakness, RLE deficits/detail RLE Deficits / Details: BKA     ADLs   Overall ADL's : Needs assistance/impaired Eating/Feeding: Independent, Bed level Grooming: Wash/dry hands, Wash/dry face, Supervision/safety, Sitting Grooming Details (indicate cue type and reason): attempted to perform standing at sink but was unable to keep balance and perform grooming. Upper Body Bathing: Minimal assistance, Bed level Lower Body Bathing: Maximal assistance, Bed level Upper Body Dressing : Minimal assistance, Bed level Lower Body Dressing: Maximal assistance, Bed level General ADL Comments: transfer performed to recliner with walker and assistance of 2     Mobility   Overal bed mobility: Needs Assistance Bed Mobility: Supine to Sit, Rolling, Sit to Supine Rolling: Modified independent (Device/Increase time) Supine to sit: HOB elevated, Min guard Sit to supine: Supervision General bed mobility comments: reiterated need to keep RLE elevated and knee extended if sitting EOB (pt prefers not to wear limb protector if in bed and not transferring)     Transfers   Overall transfer level: Needs assistance Equipment used: Rolling walker (2 wheeled) Transfers: Sit to/from Stand Sit to  Stand: Min assist, +2 physical assistance, +2 safety/equipment Stand pivot transfers: Min assist, +2 safety/equipment  Lateral/Scoot Transfers: +2 safety/equipment, Mod assist General transfer comment: pt on BSC on arrival; required 2 person assist to stand from [facility], recliner and bed (x4 total)     Ambulation / Gait / Stairs / Wheelchair Mobility   Ambulation/Gait Ambulation/Gait assistance: Counsellor (Feet): 2 Feet Assistive device: Rolling walker (2 wheeled) General Gait Details: several hops and then shimmied on her left foot to cover ~1 foot to and then from recliner; again shimmied x 1 ft toward [facility] Wheelchair Mobility Wheelchair mobility:  (discussed that she will need to learn to use wheelchair as do not think she will be able to ambulate functional distances with LLE weakness and sciatica)     Posture / Balance Dynamic Sitting Balance Sitting balance - Comments: Difficulty reaching bilateral feet sitting EOB Balance Overall balance assessment: Needs assistance Sitting-balance support: No upper extremity supported, Feet unsupported Sitting balance-[name]: Good Sitting balance - Comments: Difficulty reaching bilateral feet sitting EOB Standing balance support: Bilateral upper extremity supported Standing balance-[name]: Poor Standing balance comment: bil  UE support needed in standing; stood for max 30 seconds (limited by back pain)     Special needs/care consideration Wound VAC to surgical site Hgb A1c 7.3    Previous Home Environment  Living Arrangements: Spouse/significant other  Lives With: Spouse Available Help at Discharge: Family, Available 24 hours/day Type of Home: House Home Layout: Two level, Able to live on main level with bedroom/bathroom Home Access: Ramped entrance Bathroom Shower/Tub: Chiropodist: Handicapped height Bathroom Accessibility: Yes How Accessible: Accessible via walker [place]:  No Additional Comments: Uses BSC and places it near the toilet.   Discharge Living Setting Plans for Discharge Living Setting: Patient's home, Lives with (comment) (spouse) Type of Home at Discharge: House Discharge Home Layout: Two level, Able to live on main level with bedroom/bathroom Discharge Home Access: Ramped entrance Discharge Bathroom Shower/Tub: Tub/shower unit Discharge Bathroom Toilet: Handicapped height Discharge Bathroom Accessibility: Yes How Accessible: Accessible via walker Does the patient have any problems obtaining your medications?: Yes (Describe)   Social/Family/Support Systems Patient Roles: Spouse Contact Information: spouse, [name] Anticipated Caregiver: spouse Anticipated Ambulance person Information: see above Ability/Limitations of Caregiver: none Caregiver Availability: 24/7 Discharge Plan Discussed with Primary Caregiver: Yes Is Caregiver In Agreement with Plan?: Yes Does Caregiver/Family have Issues with Lodging/Transportation while Pt is in Rehab?: No   Goals Patient/Family Goal for Rehab: Mod I to supervision with PT and OT at wheelchair level Expected length of stay: ELOS 7 to 10 days Pt/Family Agrees to Admission and willing to participate: Yes Program Orientation Provided & Reviewed with Pt/Caregiver Including Roles  & Responsibilities: Yes   Decrease burden of Care through IP rehab admission: n/a   Possible need for SNF placement upon discharge: not anticipated   Patient Condition: I have reviewed medical [facility] , spoken with  patient. I met with patient at the bedside for inpatient rehabilitation assessment.  Patient will benefit from ongoing PT and OT, can actively participate in 3 hours of therapy a day 5 days of the week, and can make measurable gains during the admission.  Patient will also benefit from the coordinated team approach during an Inpatient Acute Rehabilitation admission.  The patient will receive  intensive therapy as well as Rehabilitation physician, nursing, social worker, and care management interventions.  Due to bladder management, bowel management, safety, skin/wound care, disease management, medication administration, pain management, and patient education the patient requires 24 hour a day rehabilitation nursing.  The patient is currently Min to mod assist with mobility and basic ADLs.  Discharge setting and therapy post discharge at home with home health is anticipated.  Patient has agreed to participate in the Acute Inpatient Rehabilitation Program and will admit today.   Preadmission Screen Completed By:  Cleatrice Burke, 06/08/2021 10:33 AM ______________________________________________________________________   Discussed status with Dr. Posey Pronto on 06/08/2021 at 1032 and received approval for admission today.   Admission Coordinator:  Cleatrice Burke, RN, time [id number] Date 06/08/2021    Assessment/Plan: Diagnosis: Right BKA Does the need for close, 24 hr/day Medical supervision in concert with the patient's rehab needs make it unreasonable for this patient to be served in a less intensive setting? Yes Co-Morbidities requiring supervision/potential complications: chronic pain, type 2 DM, CAD s/p PTCA, HLD, mixed connective tissue disease, hypothyroidism, asthma, GERD Due to bowel management, safety, skin/wound care, disease management, pain management, and patient education, does the patient require 24 hr/day rehab nursing? Yes Does the patient require coordinated care of a physician,  rehab nurse, PT, OT to address physical and functional deficits in the context of the above medical diagnosis(es)? Yes Addressing deficits in the following areas: balance, endurance, locomotion, strength, transferring, bathing, dressing, toileting, and psychosocial support Can the patient actively participate in an intensive therapy program of at least 3 hrs of therapy 5 days a week? Yes The  potential for patient to make measurable gains while on inpatient rehab is good Anticipated functional outcomes upon discharge from inpatient rehab:  Mod I  PT, modified independent OT, n/a SLP Estimated rehab length of stay to reach the above functional goals is: 4-6 days. Anticipated discharge destination: Home 10. Overall Rehab/Functional Prognosis: good     MD Signature: Delice Lesch, MD, ABPMR         Revision History                               Note Details  Author Jamse Arn, MD File Time 06/08/2021 10:56 AM  Author Type Physician Status Signed  Last Editor Jamse Arn, MD Service Physical Medicine and Rehabilitation

## 2021-06-08 NOTE — H&P (Signed)
Physical Medicine and Rehabilitation Admission H&P    Chief Complaint  Patient presents with   R-BKA/bacteremia    HPI: [name] is a 60 year old female with history of [name], NAFL, CAD/chronic diastolic CHF, chronic pain-on ocycodone, super morbid obesity--BMI 62, Sjogren's/mixed connective tissure disorder- on plaquenil, lymphedema right foot/shin ulcers as well as multiple other ulcers followed by wound clinic who was admitted on 05/26/21 with sepsis from Enterococcus faecalis bacteremia secondary to cellulitis and chronic diabetic foot ulcer. History taken from chart review and patient. She was started on broad spectrum antibiotics and amputation recommended due to failure of conservative therapy. She underwent right BKA by Dr. Lajoyce Corners on 06/01/21 and TEE done at that time was negative for endocarditis or valvular disease. Dr. Earlene Plater recommended  transition to ampicillin as anerobic coverage not indicated post amputation-->continue for 7 days post OR thorough 09/28 to treat bacteremia. Wound Vac was applied for 7 days. Post op has had issues with significant phantom pain but reports is feeling the "best she has felt in years". Blood sugars have been labile and Lantus added and insulin being titrated upwards.  Therapy has been ongoing and CIR recommended due to functional decline. Please see preadmission assessment from earlier today as well.   Review of Systems  Constitutional:  Negative for chills and fever.  HENT:  Negative for hearing loss and tinnitus.   Eyes:  Negative for blurred vision and double vision.  Respiratory:  Negative for shortness of breath and wheezing.   Cardiovascular:  Positive for leg swelling. Negative for chest pain and palpitations.  Gastrointestinal:  Negative for abdominal pain, constipation, heartburn and nausea.  Genitourinary:  Negative for dysuria and urgency.       Burning in vaginal area  Musculoskeletal:  Positive for back pain and myalgias.  Skin:   Positive for rash.  Neurological:  Positive for dizziness (on standing--brief and resolves), sensory change and weakness.  Psychiatric/Behavioral:  The patient does not have insomnia.   All other systems reviewed and are negative.   Past Medical History:  Diagnosis Date   Aortic stenosis    mild AS by echo 02/2021   Asthma    "on daily RX and rescue inhaler" (04/18/2018)   Chronic diastolic CHF (congestive heart failure) (HCC) 09/2015   Chronic lower back pain    Chronic neck pain    Chronic pain syndrome    Fentanyl Patch   Colon polyps    Coronary artery disease    cath with normal LM, 30% LAD, 85% mid RCA and 95% distal RCA s/p PCI of the mid to distal RCA and now on DAPT with ASA and Ticagrelor.     Eczema    Excessive daytime sleepiness 11/26/2015   Fibromyalgia    Gallstones    GERD (gastroesophageal reflux disease)    Heart murmur    "noted for the 1st time on 04/18/2018"   History of blood transfusion 07/2010   "S/P oophorectomy"   History of gout    History of hiatal hernia 1980s   "gone now" (04/18/2018)   Hyperlipidemia    Hypertension    takes Metoprolol and Enalapril daily   Hypothyroidism    takes Synthroid daily   IBS (irritable bowel syndrome)    Migraine    "nothing in the 2000s" (04/18/2018)   Mixed connective tissue disease (HCC)    NAFLD (nonalcoholic fatty liver disease)    Pneumonia    "several times" (04/18/2018)   PVC's (premature  ventricular contractions)    noted on event monitor 02/2021   Rheumatoid arthritis (HCC)    "hands, elbows, shoulders, probably knees" (04/18/2018)   Scoliosis    Spondylosis    Type II diabetes mellitus (HCC)    takes Metformin and Hum R daily (04/18/2018)    Past Surgical History:  Procedure Laterality Date   ABDOMINAL HYSTERECTOMY  06/2005   "w/right ovariy"   AMPUTATION Right 01/28/2020    RIGHT FOURTH AND FIFTH RAY AMPUTATION   AMPUTATION Right 01/28/2020   Procedure: RIGHT FOURTH AND FIFTH RAY AMPUTATION,;  Surgeon:  [name], MD;  Location: [facility] OR;  Service: Orthopedics;  Laterality: Right;   AMPUTATION Right 06/01/2021   Procedure: RIGHT BELOW KNEE AMPUTATION;  Surgeon: [name], MD;  Location: [facility] OR;  Service: Orthopedics;  Laterality: Right;   APPENDECTOMY     BREAST BIOPSY Bilateral    9 total (04/18/2018)   CORONARY ANGIOPLASTY WITH STENT PLACEMENT  10/01/2015   normal LM, 30% LAD, 85% mid RCA and 95% distal RCA s/p PCI of the mid to distal RCA and now on DAPT with ASA and Ticagrelor.     CORONARY STENT INTERVENTION Right 04/18/2018   Procedure: CORONARY STENT INTERVENTION;  Surgeon: [name], MD;  Location: [facility];  Service: Cardiovascular;  Laterality: Right;   DILATION AND CURETTAGE OF UTERUS     FRACTURE SURGERY     LAPAROSCOPIC CHOLECYSTECTOMY     LEFT HEART CATH AND CORONARY ANGIOGRAPHY N/A 04/18/2018   Procedure: LEFT HEART CATH AND CORONARY ANGIOGRAPHY;  Surgeon: [name], MD;  Location: [facility];  Service: Cardiovascular;  Laterality: N/A;   MUSCLE BIOPSY Left    "leg"   OOPHORECTOMY  07/2010   RADIOLOGY WITH ANESTHESIA N/A 05/25/2016   Procedure: RADIOLOGY WITH ANESTHESIA;  Surgeon: Medication Radiologist, MD;  Location: [facility] OR;  Service: Radiology;  Laterality: N/A;   TEE WITHOUT CARDIOVERSION N/A 06/01/2021   Procedure: TRANSESOPHAGEAL ECHOCARDIOGRAM (TEE);  Surgeon: Sande Rives, MD;  Location: [facility] OR;  Service: Cardiovascular;  Laterality: N/A;   TONSILLECTOMY AND ADENOIDECTOMY     WRIST FRACTURE SURGERY Left    "crushed it"   Family History  Problem Relation Age of Onset   CAD Mother    Hypertension Mother    Heart attack Mother    CAD Father    Heart attack Father    Allergic rhinitis Father    Asthma Father    Hypertension Brother    Hypertension Brother    Pancreatic cancer Paternal Aunt    Breast cancer Paternal Aunt    Lung cancer Paternal Aunt    Asthma Son     Social History:  reports that she quit  smoking about 17 years ago. Her smoking use included cigarettes. She started smoking about 36 years ago. She has a 19.00 pack-year smoking history. She has never used smokeless tobacco. She reports current alcohol use. She reports that she does not currently use drugs after having used the following drugs: Marijuana.    Allergies  Allergen Reactions   Fish Allergy Anaphylaxis    INCLUDES OMEGA 3 OILS   Fish Oil Anaphylaxis and Swelling    THROAT SWELLS INCLUDES FISH AS A CLASS   Omega-3 Fatty Acids Swelling    Throat swelling  Has tolerated Glucerna nutritional drinks    Crestor [Rosuvastatin] Other (See Comments)    Extreme joint pain, to this & other statins   Gabapentin Anxiety  Anxiety on high doses   Lovastatin Other (See Comments)    EXTREME JOINT PAIN   Metronidazole Nausea Only and Swelling    Headache and shakes Nausea and vomiting   Red Yeast Rice [Cholestin] Other (See Comments)    Muscle pain and severe joint pain.    Rotigotine Swelling    Extreme edema    Atrovent Hfa [Ipratropium Bromide Hfa] Other (See Comments)    wheezing   Iodine Swelling   Red Yeast Rice Extract     Joint pain   Victoza [Liraglutide]     Unsure of reaction    Enalapril Cough    Cough Cough   Suprep [Na Sulfate-K Sulfate-Mg Sulf] Nausea And Vomiting   Tape Other (See Comments)    Electrodes causes skin breakdown    Medications Prior to Admission  Medication Sig Dispense Refill   acetaminophen (TYLENOL) 650 MG CR tablet Take 1,300 mg by mouth in the morning and at bedtime.     albuterol (PROVENTIL) (2.5 MG/3ML) 0.083% nebulizer solution USE 1 VIAL IN NEBULIZER EVERY 4 HOURS AS NEEDED FOR WHEEZING OR  FOR  SHORTNESS  OF  BREATH (Patient taking differently: Take 2.5 mg by nebulization every 4 (four) hours as needed.) 450 mL 0   albuterol (VENTOLIN HFA) 108 (90 Base) MCG/ACT inhaler Inhale 2 puffs into the lungs every 4 (four) hours as needed for wheezing or shortness of breath. 18 g 5    aspirin EC 81 MG tablet Take 81 mg by mouth daily.     Bempedoic Acid-Ezetimibe (NEXLIZET) 180-10 MG TABS Take 1 tablet by mouth daily. 30 tablet 11   clotrimazole-betamethasone (LOTRISONE) cream Apply 1 application topically 2 (two) times daily. 30 g 0   collagenase (SANTYL) ointment Apply 1 application topically daily.     cyclobenzaprine (FLEXERIL) 10 MG tablet Take 0.5-1 tablets (5-10 mg total) by mouth 3 (three) times daily as needed for muscle spasms. 30 tablet 0   diltiazem (DILT-XR) 240 MG 24 hr capsule Take 1 capsule (240 mg total) by mouth daily. 90 capsule 3   diphenhydrAMINE (BENADRYL) 25 MG tablet Take 25 mg by mouth daily as needed for allergies.     EPINEPHrine (EPIPEN 2-PAK) 0.3 mg/0.3 mL IJ SOAJ injection USE AS DIRECTED FOR SEVERE ALLERGIC REACTION. (Patient taking differently: Inject 0.3 mg into the muscle as needed for anaphylaxis. Use as directed for severe allergic reaction) 2 each 0   esomeprazole (NEXIUM) 40 MG capsule Take 40 mg by mouth daily as needed (Heartburn).     fluticasone (FLONASE) 50 MCG/ACT nasal spray USE 2 SPRAY(S) IN EACH NOSTRIL ONCE DAILY AS NEEDED FOR  ALLERGIES  OR  RHINITIS (Patient taking differently: Place 2 sprays into both nostrils daily as needed for rhinitis or allergies.) 16 g 0   furosemide (LASIX) 20 MG tablet Take 0.5 tablets (10 mg total) by mouth daily. As needed for swelling 30 tablet 3   gabapentin (NEURONTIN) 400 MG capsule Take 1,600 mg by mouth as directed. 400 mg in morning, 400 mg at noon, 800 mg at night     HUMULIN R 500 UNIT/ML injection INJECT 0.08-0.3 MLS (40-150 UNITS TOTAL) INTO THE SKIN THREE TIMES DAILY WITH MEALS (Patient taking differently: Inject into the skin 3 (three) times daily with meals. Inject 40-150 units total into the skin 3 times daily with meals) 20 mL 0   hydroxychloroquine (PLAQUENIL) 200 MG tablet Take 1 tablet (200 mg total) by mouth 2 (two) times daily. 180 tablet 0  isosorbide mononitrate (IMDUR) 30 MG 24  hr tablet Take 1 tablet (30 mg total) by mouth daily. 90 tablet 3   levocetirizine (XYZAL) 5 MG tablet TAKE 1 TABLET BY MOUTH ONCE DAILY IN THE EVENING (Patient taking differently: Take 5 mg by mouth every evening.) 90 tablet 0   levothyroxine (SYNTHROID) 200 MCG tablet TAKE 1 TABLET BY MOUTH ONCE DAILY BEFORE BREAKFAST (Patient taking differently: Take 200 mcg by mouth daily before breakfast.) 90 tablet 0   magnesium oxide (MAG-OX) 400 MG tablet Take 400-1,600 mg by mouth 2 (two) times daily as needed (cramps).      metFORMIN (GLUCOPHAGE-XR) 500 MG 24 hr tablet Take 2 tablets by mouth twice daily 360 tablet 0   mupirocin ointment (BACTROBAN) 2 % Apply 1 application topically 2 (two) times daily.     nitroGLYCERIN (NITROSTAT) 0.4 MG SL tablet DISSOLVE ONE TABLET UNDER THE TONGUE EVERY 5 MINUTES AS NEEDED FOR CHEST PAIN.  DO NOT EXCEED A TOTAL OF 3 DOSES IN 15 MINUTES (Patient taking differently: Place 0.4 mg under the tongue every 5 (five) minutes as needed for chest pain.) 25 tablet 0   Oxycodone HCl 10 MG TABS Take 10 mg by mouth every 8 (eight) hours as needed (pain).     promethazine (PHENERGAN) 25 MG tablet TAKE 1 TABLET BY MOUTH EVERY 8 HOURS AS NEEDED FOR NAUSEA AND FOR VOMITING (Patient taking differently: 25 mg every 8 (eight) hours as needed for vomiting or nausea.) 90 tablet 2   valsartan (DIOVAN) 160 MG tablet Take 1 tablet (160 mg total) by mouth daily. 90 tablet 3   doxycycline (VIBRA-TABS) 100 MG tablet Take 1 tablet (100 mg total) by mouth 2 (two) times daily. (Patient not taking: No sig reported) 60 tablet 0   fluconazole (DIFLUCAN) 150 MG tablet Take 1 tablet (150 mg total) by mouth every 3 (three) days. (Patient not taking: No sig reported) 10 tablet 2   glucose blood test strip 1 each 4 (four) times daily.      INSULIN SYRINGE 1CC/29G (B-D INSULIN SYRINGE) 29G X 1/2" 1 ML MISC Use three times daily with insulin.  DX E11.9 200 each 3   potassium bicarbonate (K-LYTE) 25 MEQ  disintegrating tablet Take 25 mEq daily with every dose of Lasix. 30 tablet 5   pramipexole (MIRAPEX) 0.5 MG tablet Take 6 mg by mouth See admin instructions. Take 0.5 mg up to 6 times daily as needed for restless legs     saccharomyces boulardii (FLORASTOR) 250 MG capsule Take 250 mg by mouth 3 (three) times daily.     Vitamin D, Ergocalciferol, (DRISDOL) 1.25 MG (50000 UNIT) CAPS capsule TAKE 1 CAPSULE BY MOUTH ONCE A WEEK (EVERY  7  DAYS)  TAKE  ON  FRIDAY (Patient taking differently: Take 50,000 Units by mouth every 7 (seven) days. TAKE 1 CAPSULE BY MOUTH ONCE A WEEK (EVERY  7  DAYS)  TAKE  ON  FRIDAY) 12 capsule 3    Drug Regimen Review  Drug regimen was reviewed and remains appropriate with no significant issues identified  Home: Home Living Family/patient expects to be discharged to:: Private residence Living Arrangements: Spouse/significant other Available Help at Discharge: Family, Available 24 hours/day Type of Home: House Home Access: Ramped entrance Home Layout: Two level, Able to live on main level with bedroom/bathroom Bathroom Shower/Tub: Engineer, manufacturing systems: Handicapped height Bathroom Accessibility: Yes Home Equipment: Tub bench, Bedside commode, Walker - 4 wheels, Walker - 2 wheels, Wheelchair - manual Additional  Comments: Uses BSC and places it near the toilet.  Lives With: Spouse   Functional History: Prior Function Level of Independence: Independent, Needs assistance Gait / Transfers Assistance Needed: Independent without DME to walk household distances, will hold onto furniture if needed for balance. Does not walk outside, husband pushes her in w/c; sleeps in recliner often ADL's / Homemaking Assistance Needed: Husband does majority of cooking and cleaning, will assist with back and shoes/socks on occasion.  Functional Status:  Mobility: Bed Mobility Overal bed mobility: Needs Assistance Bed Mobility: Supine to Sit, Rolling, Sit to Supine Rolling:  Modified independent (Device/Increase time) Supine to sit: HOB elevated, Min guard Sit to supine: Supervision General bed mobility comments: sitting EOB with RLE up on bed with knee extended on arrival; to supine and rolling for bed exercises for LEs; return to sit EOB Transfers Overall transfer level: Needs assistance Equipment used: Rolling walker (2 wheeled) Transfers: Sit to/from Stand Sit to Stand: Min assist Stand pivot transfers: Min assist, +2 safety/equipment  Lateral/Scoot Transfers: +2 safety/equipment, Mod assist General transfer comment: attempted sit to stand x 3 from EOB with 1 person assist; pt unable to come to full stand with reports of dizziness and feeling fearful. Reports she got to[facility] with [facility] nurse and not sure why she cannot stand up at this time (this was attempted after exercises x 4 extremities) Ambulation/Gait Ambulation/Gait assistance: Min guard Gait Distance (Feet): 3 Feet Assistive device: Rolling walker (2 wheeled) General Gait Details: pivotal hops from bed to recliner on her left    ADL: ADL Overall ADL's : Needs assistance/impaired Eating/Feeding: Independent, Bed level Grooming: Wash/dry hands, Wash/dry face, Supervision/safety, Sitting Grooming Details (indicate cue type and reason): attempted to perform standing at sink but was unable to keep balance and perform grooming. Upper Body Bathing: Minimal assistance, Bed level Lower Body Bathing: Maximal assistance, Bed level Upper Body Dressing : Minimal assistance, Bed level Lower Body Dressing: Maximal assistance, Bed level General ADL Comments: transfer performed to recliner with walker and assistance of 2  Cognition: Cognition Overall Cognitive Status: Within Functional Limits for tasks assessed Orientation Level: Oriented X4 Cognition Arousal/Alertness: Awake/alert Behavior During Therapy: WFL for tasks assessed/performed Overall Cognitive Status: Within Functional Limits  for tasks assessed General Comments: good participation   Blood pressure 132/63, pulse 82, temperature 98.2 F (36.8 C), temperature source Oral, resp. rate 17, height 5\' 2"  (1.575 m), weight (!) 155 kg, SpO2 96 %. Physical Exam Vitals and nursing note reviewed.  Constitutional:      General: She is not in acute distress.    Appearance: She is obese. She is not ill-appearing.     Comments: Sitting at the EOB with good balance. NAD.  HENT:     Head: Normocephalic and atraumatic.     Right Ear: External ear normal.     Left Ear: External ear normal.     Nose: Nose normal.  Eyes:     General:        Right eye: No discharge.        Left eye: No discharge.     Extraocular Movements: Extraocular movements intact.  Neck:     Comments:  Super super obese Cardiovascular:     Rate and Rhythm: Normal rate and regular rhythm.  Pulmonary:     Effort: Pulmonary effort is normal. No respiratory distress.  Abdominal:     General: There is no distension.     Tenderness: There is no abdominal tenderness.  Musculoskeletal:  General: Swelling present. Tenderness: Right BKA.    Cervical back: Normal range of motion and neck supple.     Comments: B/l LE edema  Skin:    General: Skin is warm and dry.     Comments: Knee high TEDs on LLE. Small round burn lesions on breast and back with yellow eschar. Umbilical area with linear laceration moist due to bacitracin? Right BKA CDI  Neurological:     Mental Status: She is alert and oriented to person, place, and time.     Comments: Alert and oriented.  Motor: LLE: HF 3--3/5, KE 4+/5, ADF 5/5 RLE: HF, KE 4-/5 (some pain inhibition)  Psychiatric:        Mood and Affect: Mood normal.        Behavior: Behavior normal.    Results for orders placed or performed during the hospital encounter of 05/26/21 (from the past 48 hour(s))  Glucose, capillary     Status: Abnormal   Collection Time: 06/06/21 12:20 PM  Result Value Ref Range    Glucose-Capillary 218 (H) 70 - 99 mg/dL    Comment: Glucose reference range applies only to samples taken after fasting for at least 8 hours.  Glucose, capillary     Status: Abnormal   Collection Time: 06/06/21  4:39 PM  Result Value Ref Range   Glucose-Capillary 114 (H) 70 - 99 mg/dL    Comment: Glucose reference range applies only to samples taken after fasting for at least 8 hours.  Glucose, capillary     Status: Abnormal   Collection Time: 06/06/21  9:52 PM  Result Value Ref Range   Glucose-Capillary 124 (H) 70 - 99 mg/dL    Comment: Glucose reference range applies only to samples taken after fasting for at least 8 hours.  Procalcitonin     Status: None   Collection Time: 06/07/21  3:03 AM  Result Value Ref Range   Procalcitonin 0.11 ng/mL    Comment:        Interpretation: PCT (Procalcitonin) <= 0.5 ng/mL: Systemic infection (sepsis) is not likely. Local bacterial infection is possible. (NOTE)       Sepsis PCT Algorithm           Lower Respiratory Tract                                      Infection PCT Algorithm    ----------------------------     ----------------------------         PCT < 0.25 ng/mL                PCT < 0.10 ng/mL          Strongly encourage             Strongly discourage   discontinuation of antibiotics    initiation of antibiotics    ----------------------------     -----------------------------       PCT 0.25 - 0.50 ng/mL            PCT 0.10 - 0.25 ng/mL               OR       >80% decrease in PCT            Discourage initiation of  antibiotics      Encourage discontinuation           of antibiotics    ----------------------------     -----------------------------         PCT >= 0.50 ng/mL              PCT 0.26 - 0.50 ng/mL               AND        <80% decrease in PCT             Encourage initiation of                                             antibiotics       Encourage continuation           of  antibiotics    ----------------------------     -----------------------------        PCT >= 0.50 ng/mL                  PCT > 0.50 ng/mL               AND         increase in PCT                  Strongly encourage                                      initiation of antibiotics    Strongly encourage escalation           of antibiotics                                     -----------------------------                                           PCT <= 0.25 ng/mL                                                 OR                                        > 80% decrease in PCT                                      Discontinue / Do not initiate                                             antibiotics  Performed at [facility], [address], [place], [place] [address]   CBC     Status: Abnormal   Collection Time:  06/07/21  3:03 AM  Result Value Ref Range   WBC 7.1 4.0 - 10.5 K/uL   RBC 3.79 (L) 3.87 - 5.11 MIL/uL   Hemoglobin 9.7 (L) 12.0 - 15.0 g/dL   HCT 63.8 (L) 46.6 - 59.9 %   MCV 86.3 80.0 - 100.0 fL   MCH 25.6 (L) 26.0 - 34.0 pg   MCHC 29.7 (L) 30.0 - 36.0 g/dL   RDW 35.7 (H) 01.7 - 79.3 %   Platelets 297 150 - 400 K/uL   nRBC 0.0 0.0 - 0.2 %    Comment: Performed at [facility], [address] [place], [place] [address]  Basic metabolic panel     Status: Abnormal   Collection Time: 06/07/21  3:03 AM  Result Value Ref Range   Sodium 136 135 - 145 mmol/L   Potassium 4.2 3.5 - 5.1 mmol/L   Chloride 100 98 - 111 mmol/L   CO2 27 22 - 32 mmol/L   Glucose, Bld 129 (H) 70 - 99 mg/dL    Comment: Glucose reference range applies only to samples taken after fasting for at least 8 hours.   BUN 14 6 - 20 mg/dL   Creatinine, Ser 9.23 0.44 - 1.00 mg/dL   Calcium 8.6 (L) 8.9 - 10.3 mg/dL   GFR, Estimated >30 >07 mL/min    Comment: (NOTE) Calculated using the CKD-EPI Creatinine Equation (2021)    Anion gap 9 5 - 15    Comment: Performed at [facility], [address] [place], [place] [address]  Glucose, capillary     Status: Abnormal   Collection Time: 06/07/21  7:42 AM  Result Value Ref Range   Glucose-Capillary 158 (H) 70 - 99 mg/dL    Comment: Glucose reference range applies only to samples taken after fasting for at least 8 hours.  Glucose, capillary     Status: Abnormal   Collection Time: 06/07/21 12:19 PM  Result Value Ref Range   Glucose-Capillary 176 (H) 70 - 99 mg/dL    Comment: Glucose reference range applies only to samples taken after fasting for at least 8 hours.  Glucose, capillary     Status: Abnormal   Collection Time: 06/07/21  4:02 PM  Result Value Ref Range   Glucose-Capillary 164 (H) 70 - 99 mg/dL    Comment: Glucose reference range applies only to samples taken after fasting for at least 8 hours.  Glucose, capillary     Status: Abnormal   Collection Time: 06/07/21  9:18 PM  Result Value Ref Range   Glucose-Capillary 194 (H) 70 - 99 mg/dL    Comment: Glucose reference range applies only to samples taken after fasting for at least 8 hours.  Procalcitonin     Status: None   Collection Time: 06/08/21  1:04 AM  Result Value Ref Range   Procalcitonin <0.10 ng/mL    Comment:        Interpretation: PCT (Procalcitonin) <= 0.5 ng/mL: Systemic infection (sepsis) is not likely. Local bacterial infection is possible. (NOTE)       Sepsis PCT Algorithm           Lower Respiratory Tract                                      Infection PCT Algorithm    ----------------------------     ----------------------------  PCT < 0.25 ng/mL                PCT < 0.10 ng/mL          Strongly encourage             Strongly discourage   discontinuation of antibiotics    initiation of antibiotics    ----------------------------     -----------------------------       PCT 0.25 - 0.50 ng/mL            PCT 0.10 - 0.25 ng/mL               OR       >80% decrease in PCT            Discourage initiation of                                             antibiotics      Encourage discontinuation           of antibiotics    ----------------------------     -----------------------------         PCT >= 0.50 ng/mL              PCT 0.26 - 0.50 ng/mL               AND        <80% decrease in PCT             Encourage initiation of                                             antibiotics       Encourage continuation           of antibiotics    ----------------------------     -----------------------------        PCT >= 0.50 ng/mL                  PCT > 0.50 ng/mL               AND         increase in PCT                  Strongly encourage                                      initiation of antibiotics    Strongly encourage escalation           of antibiotics                                     -----------------------------                                           PCT <= 0.25 ng/mL  OR                                        > 80% decrease in PCT                                      Discontinue / Do not initiate                                             antibiotics  Performed at [facility], [address] [place], [place] [address]   Glucose, capillary     Status: Abnormal   Collection Time: 06/08/21  7:51 AM  Result Value Ref Range   Glucose-Capillary 166 (H) 70 - 99 mg/dL    Comment: Glucose reference range applies only to samples taken after fasting for at least 8 hours.   No results found.     Medical Problem List and Plan: 1.  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  -patient may not shower  -ELOS/Goals: 4-6 days/Mod I  Admit to CIR 2.  Antithrombotics: -DVT/anticoagulation:  Pharmaceutical: Lovenox  -antiplatelet therapy: on ASA  3. Pain Management: used Oxycodone, Flexeril and gabapentin TID PTA.-->Dr. Burnett Corrente [facility])  --has sharp shooting pains in RLE but has had SE with higher doses.    --willing to try lower dose Lyrica for neuropathy/phantom pain.   Monitor with increased exertion 4. Mood: LCSW to follow for evaluation and support.   -antipsychotic agents: N/A 5. [name]: This patient is capable of making decisions on her own behalf. 6. Skin/Wound Care: Routine pressure relief measures.  --add santyl to yellow eschar on burn sites on breast and on back.  7. Fluids/Electrolytes/Nutrition: Monitor I/O. Check lytes in am.  8. [name]: Hgb A1c-7.3. Was on metformin 1000 mg bid with Humulin 500 TID --Continue Insulin glargline  with novolog for meal coverage. Resume metformin.  --Will monitor BS ac/hs and use SSI for tighter control.  Monitor with increased moblity 9. Bacteremia: Ampicillin every 4 hours with end date 06/08/21 midnight.  10. HTN: Monitor BP TID--continue Avapro, Imdur and Cardizem.   Monitor with increase mobility 11. Connective tissue d/o/Sjogren's: Followed Dr.J. Bravo ([facility])  --continue Plaquenil. Husband to bring eye drops from home.  11. CAD s/p PCI/chronic systolic CHF: Monitor for symptoms with increase in activity.  --On ASA, Imdur and Avapro.  No statin due to NAFL? --monitor for signs of overload.  12. IBS-D: Has been refusing laxatives due to concerns of diarrhea but hard stools reported.   --willing to try one Senna S daily -->monitor for results/SE 13. Asthma: Managed with prn MDI use. 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 Will check LFTs in am.  15. Acute blood loss anemia:   CBC ordered for tomorrow 16. Chronic lymphedema: Continue support stockings. Heart healthy diet.    Jacquelynn Cree, PA-C 06/08/2021  I have personally performed a face to face diagnostic evaluation, including, but not limited to relevant history and physical exam findings, of this patient and developed relevant assessment and plan.  Additionally, I have reviewed and concur with the physician assistant's documentation above.  Maryla Morrow, MD,  ABPMR  The patient's status has not changed. Any changes from the pre-admission screening or documentation from the acute chart are noted above.   Delice Lesch, MD, ABPMR

## 2021-06-09 ENCOUNTER — Encounter (HOSPITAL_BASED_OUTPATIENT_CLINIC_OR_DEPARTMENT_OTHER): Payer: 59 | Admitting: Internal Medicine

## 2021-06-09 DIAGNOSIS — S88111A Complete traumatic amputation at level between knee and ankle, right lower leg, initial encounter: Secondary | ICD-10-CM

## 2021-06-09 LAB — CBC WITH DIFFERENTIAL/PLATELET
Abs Immature Granulocytes: 0.02 10*3/uL (ref 0.00–0.07)
Basophils Absolute: 0.1 10*3/uL (ref 0.0–0.1)
Basophils Relative: 1 %
Eosinophils Absolute: 0.6 10*3/uL — ABNORMAL HIGH (ref 0.0–0.5)
Eosinophils Relative: 9 %
HCT: 32.8 % — ABNORMAL LOW (ref 36.0–46.0)
Hemoglobin: 9.9 g/dL — ABNORMAL LOW (ref 12.0–15.0)
Immature Granulocytes: 0 %
Lymphocytes Relative: 32 %
Lymphs Abs: 2.3 10*3/uL (ref 0.7–4.0)
MCH: 25.6 pg — ABNORMAL LOW (ref 26.0–34.0)
MCHC: 30.2 g/dL (ref 30.0–36.0)
MCV: 84.8 fL (ref 80.0–100.0)
Monocytes Absolute: 0.6 10*3/uL (ref 0.1–1.0)
Monocytes Relative: 9 %
Neutro Abs: 3.4 10*3/uL (ref 1.7–7.7)
Neutrophils Relative %: 49 %
Platelets: 364 10*3/uL (ref 150–400)
RBC: 3.87 MIL/uL (ref 3.87–5.11)
RDW: 16.7 % — ABNORMAL HIGH (ref 11.5–15.5)
WBC: 7.1 10*3/uL (ref 4.0–10.5)
nRBC: 0 % (ref 0.0–0.2)

## 2021-06-09 LAB — COMPREHENSIVE METABOLIC PANEL
ALT: 39 U/L (ref 0–44)
AST: 51 U/L — ABNORMAL HIGH (ref 15–41)
Albumin: 2.5 g/dL — ABNORMAL LOW (ref 3.5–5.0)
Alkaline Phosphatase: 63 U/L (ref 38–126)
Anion gap: 9 (ref 5–15)
BUN: 9 mg/dL (ref 6–20)
CO2: 27 mmol/L (ref 22–32)
Calcium: 8.9 mg/dL (ref 8.9–10.3)
Chloride: 100 mmol/L (ref 98–111)
Creatinine, Ser: 0.82 mg/dL (ref 0.44–1.00)
GFR, Estimated: >60 mL/min (ref >60)
Glucose, Bld: 184 mg/dL — ABNORMAL HIGH (ref 70–99)
Potassium: 4.1 mmol/L (ref 3.5–5.1)
Sodium: 136 mmol/L (ref 135–145)
Total Bilirubin: 0.6 mg/dL (ref 0.3–1.2)
Total Protein: 6.4 g/dL — ABNORMAL LOW (ref 6.5–8.1)

## 2021-06-09 LAB — GLUCOSE, CAPILLARY
Glucose-Capillary: 183 mg/dL — ABNORMAL HIGH (ref 70–99)
Glucose-Capillary: 166 mg/dL — ABNORMAL HIGH (ref 70–99)
Glucose-Capillary: 134 mg/dL — ABNORMAL HIGH (ref 70–99)
Glucose-Capillary: 152 mg/dL — ABNORMAL HIGH (ref 70–99)

## 2021-06-09 MED ORDER — ALBUTEROL SULFATE (2.5 MG/3ML) 0.083% IN NEBU
3.0000 mL | INHALATION_SOLUTION | RESPIRATORY_TRACT | Status: DC | PRN
  Administered 2021-06-11 – 2021-06-20 (×7): 3 mL via RESPIRATORY_TRACT
  Filled 2021-06-09 (×2): qty 3
  Filled 2021-06-09: qty 9
  Filled 2021-06-09 (×3): qty 3
  Filled 2021-06-09: qty 9

## 2021-06-09 NOTE — Evaluation (Signed)
Physical Therapy Assessment and Plan  Patient Details  Name: [name] MRN: [id number] Date of Birth: September 29, 1960  PT Diagnosis: Abnormal posture, Abnormality of gait, Difficulty walking, Edema, Impaired sensation, Muscle weakness, and Pain in R residual limb Rehab Potential: Good ELOS: 7-10 days   Today's Date: 06/09/2021 PT Individual Time: 0800-0908 PT Individual Time Calculation (min): 68 min    Hospital Problem: Principal Problem:   Below-knee amputation of right lower extremity ([place]) Active Problems:   Unilateral complete BKA, right, subsequent encounter [facility])   Past Medical History:  Past Medical History:  Diagnosis Date   Aortic stenosis    mild AS by echo 02/2021   Asthma    "on daily RX and rescue inhaler" (04/18/2018)   Chronic diastolic CHF (congestive heart failure) ([place]) 09/2015   Chronic lower back pain    Chronic neck pain    Chronic pain syndrome    Fentanyl Patch   Colon polyps    Coronary artery disease    cath with normal LM, 30% LAD, 85% mid RCA and 95% distal RCA s/p PCI of the mid to distal RCA and now on DAPT with ASA and Ticagrelor.     Eczema    Excessive daytime sleepiness 11/26/2015   Fibromyalgia    Gallstones    GERD (gastroesophageal reflux disease)    Heart murmur    "noted for the 1st time on 04/18/2018"   History of blood transfusion 07/2010   "S/P oophorectomy"   History of gout    History of hiatal hernia 1980s   "gone now" (04/18/2018)   Hyperlipidemia    Hypertension    takes Metoprolol and Enalapril daily   Hypothyroidism    takes Synthroid daily   IBS (irritable bowel syndrome)    Migraine    "nothing in the 2000s" (04/18/2018)   Mixed connective tissue disease ([place])    NAFLD (nonalcoholic fatty liver disease)    Pneumonia    "several times" (04/18/2018)   PVC's (premature ventricular contractions)    noted on event monitor 02/2021   Rheumatoid arthritis ([place])    "hands, elbows, shoulders, probably knees" (04/18/2018)    Scoliosis    Spondylosis    Type II diabetes mellitus ([place])    takes Metformin and Hum R daily (04/18/2018)   Past Surgical History:  Past Surgical History:  Procedure Laterality Date   ABDOMINAL HYSTERECTOMY  06/2005   "w/right ovariy"   AMPUTATION Right 01/28/2020    RIGHT FOURTH AND FIFTH RAY AMPUTATION   AMPUTATION Right 01/28/2020   Procedure: RIGHT FOURTH AND FIFTH RAY AMPUTATION,;  Surgeon: Newt Minion, MD;  Location: [place];  Service: Orthopedics;  Laterality: Right;   AMPUTATION Right 06/01/2021   Procedure: RIGHT BELOW KNEE AMPUTATION;  Surgeon: Newt Minion, MD;  Location: [place];  Service: Orthopedics;  Laterality: Right;   APPENDECTOMY     BREAST BIOPSY Bilateral    9 total (04/18/2018)   CORONARY ANGIOPLASTY WITH STENT PLACEMENT  10/01/2015   normal LM, 30% LAD, 85% mid RCA and 95% distal RCA s/p PCI of the mid to distal RCA and now on DAPT with ASA and Ticagrelor.     CORONARY STENT INTERVENTION Right 04/18/2018   Procedure: CORONARY STENT INTERVENTION;  Surgeon: Burnell Blanks, MD;  Location: [place] [facility];  Service: Cardiovascular;  Laterality: Right;   DILATION AND CURETTAGE OF UTERUS     FRACTURE SURGERY     LAPAROSCOPIC CHOLECYSTECTOMY     LEFT HEART CATH  AND CORONARY ANGIOGRAPHY N/A 04/18/2018   Procedure: LEFT HEART CATH AND CORONARY ANGIOGRAPHY;  Surgeon: Burnell Blanks, MD;  Location: [place] [facility];  Service: Cardiovascular;  Laterality: N/A;   MUSCLE BIOPSY Left    "leg"   OOPHORECTOMY  07/2010   RADIOLOGY WITH ANESTHESIA N/A 05/25/2016   Procedure: RADIOLOGY WITH ANESTHESIA;  Surgeon: Medication Radiologist, MD;  Location: [place];  Service: Radiology;  Laterality: N/A;   TEE WITHOUT CARDIOVERSION N/A 06/01/2021   Procedure: TRANSESOPHAGEAL ECHOCARDIOGRAM (TEE);  Surgeon: Geralynn Rile, MD;  Location: [place];  Service: Cardiovascular;  Laterality: N/A;   TONSILLECTOMY AND ADENOIDECTOMY     WRIST FRACTURE SURGERY Left    "crushed  it"    Assessment & Plan Clinical Impression: Patient is a 60 y.o. year old female with  history of [name], NAFL, CAD/chronic diastolic CHF, chronic pain-on ocycodone, super morbid obesity--BMI 47, Sjogren's/mixed connective tissure disorder- on plaquenil, lymphedema right foot/shin ulcers as well as multiple other ulcers followed by wound clinic who was admitted on 05/26/21 with sepsis from Enterococcus faecalis bacteremia secondary to cellulitis and chronic diabetic foot ulcer. History taken from chart review and patient. She was started on broad spectrum antibiotics and amputation recommended due to failure of conservative therapy. She underwent right BKA by Dr. Sharol Given on 06/01/21 and TEE done at that time was negative for endocarditis or valvular disease. Dr. Juleen China recommended  transition to ampicillin as anerobic coverage not indicated post amputation-->continue for 7 days post OR thorough 09/28 to treat bacteremia. Wound Vac was applied for 7 days. Post op has had issues with significant phantom pain but reports is feeling the "best she has felt in years". Blood sugars have been labile and Lantus added and insulin being titrated upwards.  Therapy has been ongoing and CIR recommended due to functional decline. Please see preadmission assessment from earlier today as well.   Patient currently requires min with mobility secondary to muscle weakness, decreased cardiorespiratoy endurance, and decreased standing balance, decreased postural control, decreased balance strategies, and difficulty maintaining precautions.  Prior to hospitalization, patient was modified independent  with mobility and lived with Spouse in a House home.  Home access is  Ramped entrance.  Patient will benefit from skilled PT intervention to maximize safe functional mobility, minimize fall risk, and decrease caregiver burden for planned discharge home with 24 hour supervision.  Anticipate patient will benefit from follow up [place] at  discharge.  PT - End of Session Activity Tolerance: Tolerates 30+ min activity with multiple rests Endurance Deficit: Yes Endurance Deficit Description: required rest break after transfer PT Assessment Rehab Potential (ACUTE/IP ONLY): Good PT Barriers to Discharge: Wound Care;Weight;Weight bearing restrictions;Other (comments) PT Barriers to Discharge Comments: pain, RLE NWB status, body habitus, wound care PT Patient demonstrates impairments in the following area(s): Balance;Edema;Endurance;Motor;Nutrition;Pain;Sensory;Skin Integrity PT Transfers Functional Problem(s): Bed Mobility;Bed to Chair;Car;Furniture PT Locomotion Functional Problem(s): Ambulation;Wheelchair Mobility;Stairs PT Plan PT Intensity: Minimum of 1-2 x/day ,45 to 90 minutes PT Frequency: 5 out of 7 days PT Duration Estimated Length of Stay: 7-10 days PT Treatment/Interventions: Ambulation/gait training;Discharge planning;Functional mobility training;Psychosocial support;Therapeutic Activities;Balance/vestibular training;Disease management/prevention;Neuromuscular re-education;Skin care/wound management;Therapeutic Exercise;Wheelchair propulsion/positioning;DME/adaptive equipment instruction;Pain management;Splinting/orthotics;UE/LE Strength taining/ROM;Community reintegration;Patient/family education;Stair training;UE/LE Coordination activities PT Transfers Anticipated Outcome(s): supervision with LRAD PT Locomotion Anticipated Outcome(s): CGA with LRAD PT Recommendation Follow Up Recommendations: Home health PT Patient destination: Home Equipment Recommended: To be determined Equipment Details: has RW and rollator. Has WC but it is old and broken.  PT Evaluation Precautions/Restrictions Precautions Precautions:  Fall Required Braces or Orthoses: Other Brace Other Brace: limb protector Restrictions Weight Bearing Restrictions: Yes RLE Weight Bearing: Non weight bearing Pain Interference Pain Interference Pain  Effect on Sleep: 4. Almost constantly Pain Interference with Therapy Activities: 1. Rarely or not at all Pain Interference with Day-to-Day Activities: 2. Occasionally Home Living/Prior Functioning Home Living Available Help at Discharge: Family;Available 24 hours/day (lives with husband) Type of Home: House Home Access: Ramped entrance Home Layout: Two level;Able to live on main level with bedroom/bathroom Alternate Level Stairs-Number of Steps: flight Alternate Level Stairs-Rails: Left Bathroom Shower/Tub: Tub/shower unit Bathroom Toilet: Handicapped height (been using bedside commode for past year) Bathroom Accessibility: Yes  Lives With: Spouse Prior Function Level of Independence: Needs assistance with ADLs;Independent with gait (used RW or rollator on her bad days)  Able to Take Stairs?: No Driving: No (hasnt driven in 3 years) Vocation: Self employed Comments: pt reports enjoying taking her dogs for walks and using the rollator. Vision/Perception  Perception Perception: Within Functional Limits Praxis Praxis: Intact  Cognition Overall Cognitive Status: Within Functional Limits for tasks assessed Arousal/Alertness: Awake/alert Orientation Level: Oriented X4 Memory: Appears intact Awareness: Appears intact Problem Solving: Appears intact Safety/Judgment: Appears intact Sensation Sensation Light Touch: Impaired by gross assessment Proprioception: Impaired by gross assessment Additional Comments: absent sensation along bottom of R residual limb, absent sensation along L great toe and decreased along L medial malleoli. Coordination Gross Motor Movements are Fluid and Coordinated: No Fine Motor Movements are Fluid and Coordinated: Yes Coordination and Movement Description: grossly uncoordinated due to altered balance strategies from R BKA, pain, body habitus, weakness/deconditioning, and decreased balance/postural control. Finger Nose Finger Test: [facility] bilaterally Heel Shin  Test: unable to perform bilaterally due to body habitus and R BKA Motor  Motor Motor: Abnormal postural alignment and control Motor - Skilled Clinical Observations: grossly uncoordinated due to altered balance strategies from R BKA, pain, body habitus, weakness/deconditioning, and decreased balance/postural control.  Trunk/Postural Assessment  Cervical Assessment Cervical Assessment: Exceptions to [facility] (forward head) Thoracic Assessment Thoracic Assessment: Exceptions to [facility] (rounded shoulders) Lumbar Assessment Lumbar Assessment: Exceptions to [facility] (posterior pelvic tilt in sitting) Postural Control Postural Control: Deficits on evaluation Protective Responses: delayed due to R BKA  Balance Balance Balance Assessed: Yes Static Sitting Balance Static Sitting - Balance Support: Feet supported;Bilateral upper extremity supported Static Sitting - Level of Assistance: 6: Modified independent (Device/Increase time) Dynamic Sitting Balance Dynamic Sitting - Balance Support: Feet supported;No upper extremity supported Dynamic Sitting - Level of Assistance: 5: Stand by assistance (supervision) Static Standing Balance Static Standing - Balance Support: Bilateral upper extremity supported (RW) Static Standing - Level of Assistance: 5: Stand by assistance (CGA) Dynamic Standing Balance Dynamic Standing - Balance Support: Bilateral upper extremity supported (RW) Dynamic Standing - Level of Assistance: 4: Min assist Dynamic Standing - Comments: with transfers Extremity Assessment  RLE Assessment RLE Assessment: Exceptions to [facility] RLE Strength Right Hip Flexion: 4-/5 Right Hip ABduction: 3+/5 Right Hip ADduction: 3+/5 Right Knee Flexion: 4-/5 Right Knee Extension: 3/5 (limited by pain) Right Ankle Dorsiflexion: 4-/5 Right Ankle Plantar Flexion: 4-/5 LLE Assessment LLE Assessment: Exceptions to [facility] LLE Strength Left Hip Flexion: 4-/5 Left Hip ABduction: 4-/5 Left Hip ADduction:  4-/5 Left Knee Flexion: 4-/5 Left Knee Extension: 4/5 Left Ankle Dorsiflexion: 3+/5 Left Ankle Plantar Flexion: 4-/5  Care Tool Care Tool Bed Mobility Roll left and right activity        Sit to lying activity  Lying to sitting on side of bed activity         Care Tool Transfers Sit to stand transfer   Sit to stand assist level: Minimal Assistance - Patient > 75%    Chair/bed transfer   Chair/bed transfer assist level: Minimal Assistance - Patient > 75%     Psychologist, counselling transfer activity did not occur: Environmental limitations        Care Tool Locomotion Ambulation Ambulation activity did not occur: Safety/medical concerns (pain, weakness, decreased balance/postural control)        Walk 10 feet activity Walk 10 feet activity did not occur: Safety/medical concerns (pain, weakness, decreased balance/postural control)       Walk 50 feet with 2 turns activity Walk 50 feet with 2 turns activity did not occur: Safety/medical concerns (pain, weakness, decreased balance/postural control)      Walk 150 feet activity Walk 150 feet activity did not occur: Safety/medical concerns (pain, weakness, decreased balance/postural control)      Walk 10 feet on uneven surfaces activity Walk 10 feet on uneven surfaces activity did not occur: Safety/medical concerns (pain, weakness, decreased balance/postural control)      Stairs Stair activity did not occur: Safety/medical concerns (pain, weakness, decreased balance/postural control)        Walk up/down 1 step activity Walk up/down 1 step or curb (drop down) activity did not occur: Safety/medical concerns (pain, weakness, decreased balance/postural control)     Walk up/down 4 steps activity did not occuR: Safety/medical concerns (pain, weakness, decreased balance/postural control)  Walk up/down 4 steps activity      Walk up/down 12 steps activity Walk up/down 12 steps activity did not occur:  Safety/medical concerns (pain, weakness, decreased balance/postural control)      Pick up small objects from floor Pick up small object from the floor (from standing position) activity did not occur: Safety/medical concerns (pain, weakness, decreased balance/postural control)      Wheelchair Is the patient using a wheelchair?: Yes Type of Wheelchair: Manual   Wheelchair assist level: Dependent - Patient 0%    Wheel 50 feet with 2 turns activity   Assist Level: Dependent - Patient 0%  Wheel 150 feet activity   Assist Level: Dependent - Patient 0%    Refer to Care Plan for Long Term Goals  SHORT TERM GOAL WEEK 1 PT Short Term Goal 1 (Week 1): STG=LTG due to LOS  Recommendations for other services: None   Skilled Therapeutic Intervention Evaluation completed (see details above and below) with education on PT POC and goals and individual treatment initiated with focus on functional mobility/transfers, generalized strengthening, dynamic standing balance/coordination, and improved activity tolerance. Received pt sitting EOB, pt educated on PT evaluation, CIR policies, and therapy schedule and agreeable. Pt reported pain in R residual limb when touching/massaging it but otherwise did not c/o pain and did not state pain level. RN present to administer medications and therapist provided pt bariatric WC and RW and added balls to ends of RW points and adjusted height for stability. Donned second gown with min A and transferred sit<>stand from elevated EOB with RW and min A. Stand<>pivot bed<>WC with RW and min A with second person holding wound vac. Educated pt on desensitization strategies (massage/rubbing/tapping) to prepare limb for prosthetic fit and briefly discussed healing/recovery timeline (as pt wondering when her wound vac would come off). Provided pt with therapy schedule, as pt did not receive one. Concluded session  with pt sitting in [facility] with all needs within reach. Safety plan updated and  NT made aware of transfer status.   Mobility Transfers Transfers: Sit to Stand;Stand to Sit;Stand Pivot Transfers Sit to Stand: Minimal Assistance - Patient > 75% Stand to Sit: Contact Guard/Touching assist Stand Pivot Transfers: Minimal Assistance - Patient > 75% Stand Pivot Transfer Details: Verbal cues for sequencing;Verbal cues for technique Stand Pivot Transfer Details (indicate cue type and reason): verbal cues to reach back for WC prior to sitting Transfer (Assistive device): Rolling walker Locomotion  Gait Ambulation: No Gait Gait: No Stairs / Additional Locomotion Stairs: No Wheelchair Mobility Wheelchair Mobility: No   Discharge Criteria: Patient will be discharged from PT if patient refuses treatment 3 consecutive times without medical reason, if treatment goals not met, if there is a change in medical status, if patient makes no progress towards goals or if patient is discharged from hospital.  The above assessment, treatment plan, treatment alternatives and goals were discussed and mutually agreed upon: by patient  Alfonse Alpers PT, DPT  06/09/2021, 11:04 AM

## 2021-06-09 NOTE — Progress Notes (Signed)
Inpatient Rehabilitation  Patient information reviewed and entered into eRehab system by Courtnei Ruddell M. Amarachukwu Lakatos, M.A., CCC/SLP, PPS Coordinator.  Information including medical coding, functional ability and quality indicators will be reviewed and updated through discharge.    

## 2021-06-09 NOTE — Progress Notes (Signed)
Inpatient Rehabilitation Medication Review by a Pharmacist  A complete drug regimen review was completed for this patient to identify any potential clinically significant medication issues.  High Risk Drug Classes Is patient taking? Indication by Medication  Antipsychotic Yes Compazine for nausea  Anticoagulant Yes LMWH for DVT prophx.  Antibiotic Yes Amp for Ent Faecalis bacteremia  Opioid Yes Oxy, tramadol for chronic pain  Antiplatelet Yes ASA for CAD  Hypoglycemics/insulin Yes Semglee, Novolog, metformin for DM  Vasoactive Medication Yes Dilt, Irbesar, Imdur for CAD, CHF  Chemotherapy No   Other Yes HCQ for Sjogren's, MCTD     Type of Medication Issue Identified Description of Issue Recommendation(s)  Drug Interaction(s) (clinically significant)     Duplicate Therapy     Allergy     No Medication Administration End Date     Incorrect Dose  Mirapex Florastor Mirapex 0.5mg  up to 6x daily Florastor TID  Additional Drug Therapy Needed     Significant med changes from prior encounter (inform family/care partners about these prior to discharge).    Other       Clinically significant medication issues were identified that warrant physician communication and completion of prescribed/recommended actions by midnight of the next day:  No  Pharmacist comments: Increase Pramiprexole and Florastor when/if clinically necessary based on patient symptoms.  Time spent performing this drug regimen review (minutes):   Kelli Egolf S. Merilynn Finland, PharmD, BCPS Clinical Staff Pharmacist [email or web address]  Pasty Spillers 06/09/2021 8:38 AM

## 2021-06-09 NOTE — Progress Notes (Signed)
Inpatient Rehabilitation Care Coordinator Assessment and Plan Patient Details  Name: [name] MRN: [id number] Date of Birth: [birth year]  Today's Date: 06/09/2021  Hospital Problems: Principal Problem:   Below-knee amputation of right lower extremity [facility]) Active Problems:   Unilateral complete BKA, right, subsequent encounter [facility])  Past Medical History:  Past Medical History:  Diagnosis Date   Aortic stenosis    mild AS by echo 02/2021   Asthma    "on daily RX and rescue inhaler" (04/18/2018)   Chronic diastolic CHF (congestive heart failure) ([place]) 09/2015   Chronic lower back pain    Chronic neck pain    Chronic pain syndrome    Fentanyl Patch   Colon polyps    Coronary artery disease    cath with normal LM, 30% LAD, 85% mid RCA and 95% distal RCA s/p PCI of the mid to distal RCA and now on DAPT with ASA and Ticagrelor.     Eczema    Excessive daytime sleepiness 11/26/2015   Fibromyalgia    Gallstones    GERD (gastroesophageal reflux disease)    Heart murmur    "noted for the 1st time on 04/18/2018"   History of blood transfusion 07/2010   "S/P oophorectomy"   History of gout    History of hiatal hernia 1980s   "gone now" (04/18/2018)   Hyperlipidemia    Hypertension    takes Metoprolol and Enalapril daily   Hypothyroidism    takes Synthroid daily   IBS (irritable bowel syndrome)    Migraine    "nothing in the 2000s" (04/18/2018)   Mixed connective tissue disease ([place])    NAFLD (nonalcoholic fatty liver disease)    Pneumonia    "several times" (04/18/2018)   PVC's (premature ventricular contractions)    noted on event monitor 02/2021   Rheumatoid arthritis ([place])    "hands, elbows, shoulders, probably knees" (04/18/2018)   Scoliosis    Spondylosis    Type II diabetes mellitus ([place])    takes Metformin and Hum R daily (04/18/2018)   Past Surgical History:  Past Surgical History:  Procedure Laterality Date   ABDOMINAL HYSTERECTOMY  06/2005   "w/right ovariy"    AMPUTATION Right 01/28/2020    RIGHT FOURTH AND FIFTH RAY AMPUTATION   AMPUTATION Right 01/28/2020   Procedure: RIGHT FOURTH AND FIFTH RAY AMPUTATION,;  Surgeon: Newt Minion, MD;  Location: [place];  Service: Orthopedics;  Laterality: Right;   AMPUTATION Right 06/01/2021   Procedure: RIGHT BELOW KNEE AMPUTATION;  Surgeon: Newt Minion, MD;  Location: [place];  Service: Orthopedics;  Laterality: Right;   APPENDECTOMY     BREAST BIOPSY Bilateral    9 total (04/18/2018)   CORONARY ANGIOPLASTY WITH STENT PLACEMENT  10/01/2015   normal LM, 30% LAD, 85% mid RCA and 95% distal RCA s/p PCI of the mid to distal RCA and now on DAPT with ASA and Ticagrelor.     CORONARY STENT INTERVENTION Right 04/18/2018   Procedure: CORONARY STENT INTERVENTION;  Surgeon: Burnell Blanks, MD;  Location: [place] [facility];  Service: Cardiovascular;  Laterality: Right;   DILATION AND CURETTAGE OF UTERUS     FRACTURE SURGERY     LAPAROSCOPIC CHOLECYSTECTOMY     LEFT HEART CATH AND CORONARY ANGIOGRAPHY N/A 04/18/2018   Procedure: LEFT HEART CATH AND CORONARY ANGIOGRAPHY;  Surgeon: Burnell Blanks, MD;  Location: [place] [facility];  Service: Cardiovascular;  Laterality: N/A;   MUSCLE BIOPSY Left    "leg"  OOPHORECTOMY  07/2010   RADIOLOGY WITH ANESTHESIA N/A 05/25/2016   Procedure: RADIOLOGY WITH ANESTHESIA;  Surgeon: Medication Radiologist, MD;  Location: [place];  Service: Radiology;  Laterality: N/A;   TEE WITHOUT CARDIOVERSION N/A 06/01/2021   Procedure: TRANSESOPHAGEAL ECHOCARDIOGRAM (TEE);  Surgeon: Geralynn Rile, MD;  Location: [place];  Service: Cardiovascular;  Laterality: N/A;   TONSILLECTOMY AND ADENOIDECTOMY     WRIST FRACTURE SURGERY Left    "crushed it"   Social History:  reports that she quit smoking about 17 years ago. Her smoking use included cigarettes. She started smoking about 36 years ago. She has a 19.00 pack-year smoking history. She has never used smokeless tobacco. She reports  current alcohol use. She reports that she does not currently use drugs after having used the following drugs: Marijuana.  Family / Support Systems Spouse/Significant Other: [name] Children: Wilfred Lacy Anticipated Caregiver: [name] (spouse) Ability/Limitations of Caregiver: none Caregiver Availability: 24/7 Family Dynamics: Has support from spouse and son  Social History Preferred language: English Religion: [place] - How often do you need to have someone help you when you read instructions, pamphlets, or other written material from your doctor or pharmacy?: Never Writes: Yes Legal History/Current Legal Issues: n/a Guardian/Conservator: spouse   Abuse/Neglect Abuse/Neglect Assessment Can Be Completed: Yes Physical Abuse: Denies Verbal Abuse: Denies Sexual Abuse: Denies Exploitation of patient/patient's resources: Denies Self-Neglect: Denies  Patient response to: Social Isolation - How often do you feel lonely or isolated from those around you?: Never  Emotional Status Recent Psychosocial Issues: coping Psychiatric History: n/a Substance Abuse History: n/a  Patient / Family Perceptions, Expectations & Goals Pt/Family understanding of illness & functional limitations: yes Premorbid pt/family roles/activities: Independent overall Anticipated changes in roles/activities/participation: spouse able to assist at d/c Pt/family expectations/goals: Supervision to [place]: None Premorbid Home Care/DME Agencies: Other (Comment) Librarian, academic, Shower chair,BSC, TTB, Marketing executive, Radio broadcast assistant) Transportation available at discharge: spouse Is the patient able to respond to transportation needs?: No In the past 12 months, has lack of transportation kept you from medical appointments or from getting medications?: No In the past 12 months, has lack of transportation kept you from meetings, work, or from getting things needed for daily living?:  No Resource referrals recommended: Neuropsychology (coping)  Discharge Planning Living Arrangements: Spouse/significant other Support Systems: Spouse/significant other, Children Type of Residence: Private residence (2 level home, ramp entrance) Insurance Resources: Multimedia programmer (specify) ([place]) Financial Screen Referred: No Living Expenses: Rent Money Management: Patient Does the patient have any problems obtaining your medications?: No Home Management: Independent Patient/Family Preliminary Plans: spouse able to assist Care Coordinator Barriers to Discharge: [place], Insurance for SNF coverage Care Coordinator Barriers to Discharge Comments: VAC, Care Coordinator Anticipated Follow Up Needs: HH/OP Expected length of stay: 7-10 Days  Clinical Impression SW met with patient, introduced self, explained role and addressed questions and concerns. Patient plans to return home, ramp entrance. No additional questions or concerns, sw will continue to follow up  Dyanne Iha 06/09/2021, 12:41 PM

## 2021-06-09 NOTE — Progress Notes (Signed)
Inpatient Rehabilitation Center Individual Statement of Services  Patient Name:  [name]  Date:  06/09/2021  Welcome to the Inpatient Rehabilitation Center.  Our goal is to provide you with an individualized program based on your diagnosis and situation, designed to meet your specific needs.  With this comprehensive rehabilitation program, you will be expected to participate in at least 3 hours of rehabilitation therapies Monday-Friday, with modified therapy programming on the weekends.  Your rehabilitation program will include the following services:  Physical Therapy (PT), Occupational Therapy (OT), Speech Therapy (ST), 24 hour per day rehabilitation nursing, Therapeutic Recreaction (TR), Neuropsychology, Care Coordinator, Rehabilitation Medicine, Nutrition Services, Pharmacy Services, and Other  Weekly team conferences will be held on Tuesdays to discuss your progress.  Your Inpatient Rehabilitation Care Coordinator will talk with you frequently to get your input and to update you on team discussions.  Team conferences with you and your family in attendance may also be held.  Expected length of stay:  7-10 Days  Overall anticipated outcome:  Supervision to MOD I  Depending on your progress and recovery, your program may change. Your Inpatient Rehabilitation Care Coordinator will coordinate services and will keep you informed of any changes. Your Inpatient Rehabilitation Care Coordinator's name and contact numbers are listed  below.  The following services may also be recommended but are not provided by the Inpatient Rehabilitation Center:   Home Health Rehabiltiation Services Outpatient Rehabilitation Services    Arrangements will be made to provide these services after discharge if needed.  Arrangements include referral to agencies that provide these services.  Your insurance has been verified to be:   [employer] Your primary doctor is:  Carmelia Roller, Jilda Roche, DO  Pertinent  information will be shared with your doctor and your insurance company.  Inpatient Rehabilitation Care Coordinator:  Lavera Guise, [place] [phone] or [phone]  Information discussed with and copy given to patient by: Andria Rhein, 06/09/2021, 12:01 PM

## 2021-06-09 NOTE — Evaluation (Signed)
Occupational Therapy Assessment and Plan  Patient Details  Name: [name] MRN: [id number] Date of Birth: 1961/05/18  OT Diagnosis: acute pain, muscle weakness (generalized), and below knee amputation,  balance deficits Rehab Potential: Rehab Potential (ACUTE ONLY): Good ELOS: 10-12   Today's Date: 06/09/2021 OT Individual Time: 1015-1130 OT Individual Time Calculation (min): 75 min     Hospital Problem: Principal Problem:   Below-knee amputation of right lower extremity ([place]) Active Problems:   Unilateral complete BKA, right, subsequent encounter [facility])   Past Medical History:  Past Medical History:  Diagnosis Date   Aortic stenosis    mild AS by echo 02/2021   Asthma    "on daily RX and rescue inhaler" (04/18/2018)   Chronic diastolic CHF (congestive heart failure) ([place]) 09/2015   Chronic lower back pain    Chronic neck pain    Chronic pain syndrome    Fentanyl Patch   Colon polyps    Coronary artery disease    cath with normal LM, 30% LAD, 85% mid RCA and 95% distal RCA s/p PCI of the mid to distal RCA and now on DAPT with ASA and Ticagrelor.     Eczema    Excessive daytime sleepiness 11/26/2015   Fibromyalgia    Gallstones    GERD (gastroesophageal reflux disease)    Heart murmur    "noted for the 1st time on 04/18/2018"   History of blood transfusion 07/2010   "S/P oophorectomy"   History of gout    History of hiatal hernia 1980s   "gone now" (04/18/2018)   Hyperlipidemia    Hypertension    takes Metoprolol and Enalapril daily   Hypothyroidism    takes Synthroid daily   IBS (irritable bowel syndrome)    Migraine    "nothing in the 2000s" (04/18/2018)   Mixed connective tissue disease ([place])    NAFLD (nonalcoholic fatty liver disease)    Pneumonia    "several times" (04/18/2018)   PVC's (premature ventricular contractions)    noted on event monitor 02/2021   Rheumatoid arthritis ([place])    "hands, elbows, shoulders, probably knees" (04/18/2018)   Scoliosis     Spondylosis    Type II diabetes mellitus ([place])    takes Metformin and Hum R daily (04/18/2018)   Past Surgical History:  Past Surgical History:  Procedure Laterality Date   ABDOMINAL HYSTERECTOMY  06/2005   "w/right ovariy"   AMPUTATION Right 01/28/2020    RIGHT FOURTH AND FIFTH RAY AMPUTATION   AMPUTATION Right 01/28/2020   Procedure: RIGHT FOURTH AND FIFTH RAY AMPUTATION,;  Surgeon: Newt Minion, MD;  Location: [place];  Service: Orthopedics;  Laterality: Right;   AMPUTATION Right 06/01/2021   Procedure: RIGHT BELOW KNEE AMPUTATION;  Surgeon: Newt Minion, MD;  Location: [place];  Service: Orthopedics;  Laterality: Right;   APPENDECTOMY     BREAST BIOPSY Bilateral    9 total (04/18/2018)   CORONARY ANGIOPLASTY WITH STENT PLACEMENT  10/01/2015   normal LM, 30% LAD, 85% mid RCA and 95% distal RCA s/p PCI of the mid to distal RCA and now on DAPT with ASA and Ticagrelor.     CORONARY STENT INTERVENTION Right 04/18/2018   Procedure: CORONARY STENT INTERVENTION;  Surgeon: Burnell Blanks, MD;  Location: [place] [facility];  Service: Cardiovascular;  Laterality: Right;   DILATION AND CURETTAGE OF UTERUS     FRACTURE SURGERY     LAPAROSCOPIC CHOLECYSTECTOMY     LEFT HEART CATH AND CORONARY  ANGIOGRAPHY N/A 04/18/2018   Procedure: LEFT HEART CATH AND CORONARY ANGIOGRAPHY;  Surgeon: Burnell Blanks, MD;  Location: [place] [facility];  Service: Cardiovascular;  Laterality: N/A;   MUSCLE BIOPSY Left    "leg"   OOPHORECTOMY  07/2010   RADIOLOGY WITH ANESTHESIA N/A 05/25/2016   Procedure: RADIOLOGY WITH ANESTHESIA;  Surgeon: Medication Radiologist, MD;  Location: [place];  Service: Radiology;  Laterality: N/A;   TEE WITHOUT CARDIOVERSION N/A 06/01/2021   Procedure: TRANSESOPHAGEAL ECHOCARDIOGRAM (TEE);  Surgeon: Geralynn Rile, MD;  Location: [place];  Service: Cardiovascular;  Laterality: N/A;   TONSILLECTOMY AND ADENOIDECTOMY     WRIST FRACTURE SURGERY Left    "crushed it"     Assessment & Plan Clinical Impression: Patient is a 60 y.o. year old female with recent admission to the hospital on 05/26/21 with fever and RLE erythema; found to have sepsis due to RLE cellulitis/diabetic foot with Enterococcus bacteremia. 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 PMH includes asthma, CHF, CAD, HLD, HTN, DM2, obesity, connective tissue disease, chronic pain, R toe amputations (01/2020). Patient transferred to CIR on 06/08/2021 .    Patient currently requires mod with basic self-care skills secondary to muscle weakness, decreased cardiorespiratoy endurance, and decreased sitting balance, decreased standing balance, decreased postural control, decreased balance strategies, and functional balance s/p BKA .  Prior to hospitalization, patient could complete BADL with min.  Patient will benefit from skilled intervention to increase independence with basic self-care skills prior to discharge home with care partner.  Anticipate patient will require minimal physical assistance and follow up home health.  OT - End of Session Endurance Deficit: Yes Endurance Deficit Description: required rest breaks within functional BADL Tasks OT Assessment Rehab Potential (ACUTE ONLY): Good OT Patient demonstrates impairments in the following area(s): Balance;Endurance;Motor;Pain;Safety;Skin Integrity OT Basic ADL's Functional Problem(s): Bathing;Dressing;Toileting OT Transfers Functional Problem(s): Toilet;Tub/Shower OT Additional Impairment(s): None OT Plan OT Intensity: Minimum of 1-2 x/day, 45 to 90 minutes OT Frequency: 5 out of 7 days OT Duration/Estimated Length of Stay: 10-12 OT Treatment/Interventions: Balance/vestibular training;Community reintegration;Discharge planning;Disease mangement/prevention;DME/adaptive equipment instruction;Functional mobility training;Pain management;Patient/family education;Psychosocial support;Self Care/advanced ADL retraining;Skin care/wound managment;UE/LE Strength  taining/ROM;Therapeutic Exercise;Therapeutic Activities;Wheelchair propulsion/positioning;UE/LE Coordination activities OT Basic [name] Anticipated Outcome(s): Min/CGA OT Toileting Anticipated Outcome(s): Min A OT Bathroom Transfers Anticipated Outcome(s): Min/CGA OT Recommendation Patient destination: Home Follow Up Recommendations: Home health OT Equipment Recommended: Other (comment) Equipment Details: bariatric drop arm BSC   OT Evaluation Precautions/Restrictions  Precautions Precautions: Fall Precaution Comments: body habitus Restrictions Weight Bearing Restrictions: Yes RLE Weight Bearing: Non weight bearing Pain 3/10 pain in residual limb. Rest and repositioned for comfort. Home Living/Prior Functioning Home Living Family/patient expects to be discharged to:: Private residence Living Arrangements: Spouse/significant other Available Help at Discharge: Family, Available 24 hours/day (lives with husband) Type of Home: House Home Access: Ramped entrance Home Layout: Two level, Able to live on main level with bedroom/bathroom Alternate Level Stairs-Number of Steps: flight Alternate Level Stairs-Rails: Left Bathroom Shower/Tub: Chiropodist: Handicapped height (been using bedside commode for past year) Bathroom Accessibility: Yes Additional Comments: Uses BSC and places it near the toilet. (BSC is NOT wide drop arm,)  Lives With: Spouse IADL History Homemaking Responsibilities: No Prior Function Level of Independence: Needs assistance with ADLs, Needs assistance with homemaking, Needs assistance with gait (used RW or rollator on her bad days, spouse assisted with ADLs up to min A)  Able to Take Stairs?: No Driving: No (hasnt driven in 3 years) Vocation: Self employed  Comments: pt reports enjoying taking her dogs for walks and using the rollator. Vision Ability to See in Adequate Light: 0 Adequate Patient Visual Report: No change from  baseline Vision Assessment?: No apparent visual deficits Perception  Perception: Within Functional Limits Praxis Praxis: Intact Cognition Overall Cognitive Status: Within Functional Limits for tasks assessed Arousal/Alertness: Awake/alert Orientation Level: Place;Person;Situation Person: Oriented Place: Oriented Situation: Oriented Year: 2022 Month: September Day of Week: Correct Memory: Appears intact Immediate Memory Recall: Sock;Blue;Bed Memory Recall Sock: Without Cue Memory Recall Blue: Without Cue Memory Recall Bed: Without Cue Awareness: Appears intact Problem Solving: Appears intact Safety/Judgment: Appears intact Sensation Sensation Light Touch: Impaired by gross assessment Proprioception: Impaired by gross assessment Coordination Gross Motor Movements are Fluid and Coordinated: No Fine Motor Movements are Fluid and Coordinated: Yes Coordination and Movement Description: grossly uncoordinated due to altered balance strategies from R BKA, pain, body habitus, weakness/deconditioning, and decreased balance/postural control. Finger Nose Finger Test: [facility] bilaterally Motor  Motor Motor: Abnormal postural alignment and control Motor - Skilled Clinical Observations: grossly uncoordinated due to altered balance strategies from R BKA, pain, body habitus, weakness/deconditioning, and decreased balance/postural control.  Trunk/Postural Assessment  Cervical Assessment Cervical Assessment: Exceptions to [facility] (forward head) Thoracic Assessment Thoracic Assessment: Exceptions to [facility] (rounded shoulders) Lumbar Assessment Lumbar Assessment: Exceptions to [facility] (posterior pelvic tilt in sitting) Postural Control Postural Control: Deficits on evaluation Protective Responses: delayed due to R BKA  Balance Balance Balance Assessed: Yes Static Sitting Balance Static Sitting - Balance Support: Feet supported;Bilateral upper extremity supported Static Sitting - Level of Assistance:  5: Stand by assistance Dynamic Sitting Balance Dynamic Sitting - Balance Support: Feet supported;No upper extremity supported Dynamic Sitting - Level of Assistance: 5: Stand by assistance (supervision) Static Standing Balance Static Standing - Balance Support: Bilateral upper extremity supported (RW) Static Standing - Level of Assistance: 4: Min assist (CGA) Dynamic Standing Balance Dynamic Standing - Balance Support: Bilateral upper extremity supported (RW) Dynamic Standing - Level of Assistance: 4: Min assist;3: Mod assist Extremity/Trunk Assessment RUE Assessment RUE Assessment: Within Functional Limits General Strength Comments: generalized weakness 4/5 LUE Assessment LUE Assessment: Within Functional Limits General Strength Comments: generalized weakness 4/5  Care Tool Care Tool Self Care Eating   Eating Assist Level: Independent    Oral Care    Oral Care Assist Level: Supervision/Verbal cueing    Bathing   [name]: Left arm;Right arm;Chest;Abdomen;Right upper leg;Left upper leg;Face [name]: Left lower leg;Buttocks;Front perineal area Body parts n/a: Right lower leg Assist Level: Moderate Assistance - Patient 50 - 74%    Upper Body Dressing(including orthotics)   What is the patient wearing?: Dress   Assist Level: Minimal Assistance - Patient > 75%    Lower Body Dressing (excluding footwear) Lower body dressing activity did not occur: Refused        Putting on/Taking off footwear   What is the patient wearing?: Non-skid slipper socks Assist for footwear: Total Assistance - Patient < 25%       Care Tool Toileting Toileting activity   Assist for toileting: Maximal Assistance - Patient 25 - 49%     Care Tool Bed Mobility Roll left and right activity   Roll left and right assist level: Independent    Sit to lying activity   Sit to lying assist level: Supervision/Verbal cueing    Lying to sitting on side of bed  activity   Lying to sitting on side of bed assist level: the  ability to move from lying on the back to sitting on the side of the bed with no back support.: Contact Guard/Touching assist     Care Tool Transfers Sit to stand transfer   Sit to stand assist level: Minimal Assistance - Patient > 75%    Chair/bed transfer   Chair/bed transfer assist level: Minimal Assistance - Patient > 75%     Toilet transfer   Assist Level: Moderate Assistance - Patient 50 - 74%     Care Tool Cognition  Expression of Ideas and Wants Expression of Ideas and Wants: 4. Without difficulty (complex and basic) - expresses complex messages without difficulty and with speech that is clear and easy to understand  Understanding Verbal and Non-Verbal Content Understanding Verbal and Non-Verbal Content: 4. Understands (complex and basic) - clear comprehension without cues or repetitions   Memory/Recall Ability Memory/Recall Ability : Current season;Location of own room;Staff names and faces;That he or she is in a hospital/hospital unit   Refer to Care Plan for [place] 1 OT Short Term Goal 1 (Week 1): Patient will complete toilet transfer with consistent min A OT Short Term Goal 2 (Week 1): Patient will complete 1 step of toileting task OT Short Term Goal 3 (Week 1): Patient will perform sit<>stand with min A on first trial  Recommendations for other services: None    Skilled Therapeutic Intervention Patient greeted seated in wc and agreeable to OT eval and treat. Pt reported need to go to the bathroom. Attenpted sit<>stand x2 from wc using heavy duty RW but pt unable to anteriorly weight shift and get to standing with OT assist. Pt then performed lateral scoot to wide drop arm BSC with mod A> Pt with gown on and did not need to perform clothing management. Pt voided bladder and needed OT assist for peri-care initially, then she showed therapist how she could use her toilet aid (which  she uses at baseline) to help cleanse peri-area. PT had dropped off smaller wc for patient and she was able to perform sit<>stand and stand-pivot to new wc with mod A. UB bathing from wc with min A, then max A for LB bathing using lateral leans for OT to wash buttocks. Pt declined to don pants and just don her gown.OT obtained a residual limb wc pad and placed that with encouragement to maintain R knee extension while seated in wc. Pt left seated in wc at end of session with needs met. ADL ADL Eating: Independent Grooming: Supervision/safety;Setup Upper Body Bathing: Minimal assistance Lower Body Bathing: Maximal assistance Upper Body Dressing: Minimal assistance Lower Body Dressing: Maximal assistance Toileting: Maximal assistance Toilet Transfer: Moderate assistance;Minimal assistance Tub/Shower Transfer: Unable to assess Mobility  Transfers Sit to Stand: Moderate Assistance - Patient 50-74% Stand to Sit: Minimal Assistance - Patient > 75%   Discharge Criteria: Patient will be discharged from OT if patient refuses treatment 3 consecutive times without medical reason, if treatment goals not met, if there is a change in medical status, if patient makes no progress towards goals or if patient is discharged from hospital.  The above assessment, treatment plan, treatment alternatives and goals were discussed and mutually agreed upon: by patient  Valma Cava 06/09/2021, 3:45 PM
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
## 2021-06-09 NOTE — Plan of Care (Signed)
  Problem: RH Balance Goal: LTG: Patient will maintain dynamic sitting balance (OT) Description: LTG:  Patient will maintain dynamic sitting balance with assistance during activities of daily living (OT) Flowsheets (Taken 06/09/2021 1522) LTG: Pt will maintain dynamic sitting balance during ADLs with: Independent Goal: LTG Patient will maintain dynamic standing with ADLs (OT) Description: LTG:  Patient will maintain dynamic standing balance with assist during activities of daily living (OT)  Flowsheets (Taken 06/09/2021 1522) LTG: Pt will maintain dynamic standing balance during ADLs with: Contact Guard/Touching assist   Problem: Sit to Stand Goal: LTG:  Patient will perform sit to stand in prep for activites of daily living with assistance level (OT) Description: LTG:  Patient will perform sit to stand in prep for activites of daily living with assistance level (OT) Flowsheets (Taken 06/09/2021 1522) LTG: PT will perform sit to stand in prep for activites of daily living with assistance level: Contact Guard/Touching assist   Problem: RH Bathing Goal: LTG Patient will bathe all body parts with assist levels (OT) Description: LTG: Patient will bathe all body parts with assist levels (OT) Flowsheets (Taken 06/09/2021 1522) LTG: Pt will perform bathing with assistance level/cueing: Contact Guard/Touching assist   Problem: RH Dressing Goal: LTG Patient will perform upper body dressing (OT) Description: LTG Patient will perform upper body dressing with assist, with/without cues (OT). Flowsheets (Taken 06/09/2021 1522) LTG: Pt will perform upper body dressing with assistance level of: Supervision/Verbal cueing Goal: LTG Patient will perform lower body dressing w/assist (OT) Description: LTG: Patient will perform lower body dressing with assist, with/without cues in positioning using equipment (OT) Flowsheets (Taken 06/09/2021 1522) LTG: Pt will perform lower body dressing with assistance level of:  Minimal Assistance - Patient > 75%   Problem: RH Toileting Goal: LTG Patient will perform toileting task (3/3 steps) with assistance level (OT) Description: LTG: Patient will perform toileting task (3/3 steps) with assistance level (OT)  Flowsheets (Taken 06/09/2021 1522) LTG: Pt will perform toileting task (3/3 steps) with assistance level: Minimal Assistance - Patient > 75%   Problem: RH Toilet Transfers Goal: LTG Patient will perform toilet transfers w/assist (OT) Description: LTG: Patient will perform toilet transfers with assist, with/without cues using equipment (OT) Flowsheets (Taken 06/09/2021 1522) LTG: Pt will perform toilet transfers with assistance level of: Contact Guard/Touching assist   Problem: RH Tub/Shower Transfers Goal: LTG Patient will perform tub/shower transfers w/assist (OT) Description: LTG: Patient will perform tub/shower transfers with assist, with/without cues using equipment (OT) Flowsheets (Taken 06/09/2021 1522) LTG: Pt will perform tub/shower stall transfers with assistance level of: Contact Guard/Touching assist

## 2021-06-09 NOTE — Progress Notes (Signed)
Occupational Therapy Session Note  Patient Details  Name: [name] MRN: [id number] Date of Birth: August 05, 1961  Today's Date: 06/09/2021 OT Individual Time: 1415-1500 OT Individual Time Calculation (min): 45 min   Short Term Goals: Week 1:     Skilled Therapeutic Interventions/Progress Updates:    Patient greeted seated in wc and agreeable to OT treatment session. Pt reported that she wanted to wash her hair. Pt brought to the sink in wc and completed hair washing task using hair washing tray. Pt able keep hair washing tray in place while OT assist with washing hair. Worked on UB endurance with hair drying task. Pt able to complete task 1/2 way, but needed OT assist to get the very back of her hair. Discussed PLOF with patient and OT goals. Pt requested to stay up in wc and left seated in wc with needs met.   Therapy Documentation Precautions:  Precautions Precautions: Fall Required Braces or Orthoses: Other Brace Other Brace: limb protector Restrictions Weight Bearing Restrictions: Yes RLE Weight Bearing: Non weight bearing Pain: 3/10 pain in residual limb, rest and respositioned for comfort.  Therapy/Group: Individual Therapy  Valma Cava 06/09/2021, 3:07 PM

## 2021-06-10 ENCOUNTER — Encounter (HOSPITAL_BASED_OUTPATIENT_CLINIC_OR_DEPARTMENT_OTHER): Payer: 59 | Admitting: Internal Medicine

## 2021-06-10 DIAGNOSIS — S88111A Complete traumatic amputation at level between knee and ankle, right lower leg, initial encounter: Secondary | ICD-10-CM | POA: Diagnosis not present

## 2021-06-10 LAB — GLUCOSE, CAPILLARY
Glucose-Capillary: 164 mg/dL — ABNORMAL HIGH (ref 70–99)
Glucose-Capillary: 112 mg/dL — ABNORMAL HIGH (ref 70–99)
Glucose-Capillary: 142 mg/dL — ABNORMAL HIGH (ref 70–99)
Glucose-Capillary: 190 mg/dL — ABNORMAL HIGH (ref 70–99)

## 2021-06-10 MED ORDER — MAGNESIUM GLUCONATE 500 MG PO TABS
250.0000 mg | ORAL_TABLET | Freq: Every day | ORAL | Status: DC
  Administered 2021-06-10 – 2021-06-19 (×10): 250 mg via ORAL
  Filled 2021-06-10 (×9): qty 1

## 2021-06-10 MED ORDER — LIDOCAINE 5 % EX PTCH
1.0000 | MEDICATED_PATCH | CUTANEOUS | Status: DC
  Administered 2021-06-10 – 2021-06-19 (×10): 1 via TRANSDERMAL
  Filled 2021-06-10 (×11): qty 1

## 2021-06-10 MED ORDER — CYCLOBENZAPRINE HCL 10 MG PO TABS
10.0000 mg | ORAL_TABLET | Freq: Three times a day (TID) | ORAL | Status: DC | PRN
  Administered 2021-06-10 – 2021-06-17 (×12): 10 mg via ORAL
  Filled 2021-06-10 (×16): qty 1

## 2021-06-10 NOTE — Progress Notes (Signed)
Orthopedic Tech Progress Note Patient Details:  Akire Rennert Holley 12-11-1960 [id number] Called order into [facility] Patient ID: Emmit Pomfret, female   DOB: Jan 06, 1961, 60 y.o.   MRN: [id number]  Lovett Calender 06/10/2021, 6:33 PM
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
## 2021-06-10 NOTE — Progress Notes (Signed)
Physical Therapy Session Note  Patient Details  Name: [name] MRN: [id number] Date of Birth: 1961-01-30  Today's Date: 06/10/2021 and 1093-2355 PT Individual Time: 1000-1111 and 70 min PT Individual Time Calculation (min): 71 min   Short Term Goals: Week 1:  PT Short Term Goal 1 (Week 1): STG=LTG due to LOS  Skilled Therapeutic Interventions/Progress Updates:   Treatment Session 1 Received pt sitting in WC, pt agreeable to PT treatment, and reported pain 7/10 in R residual limb (premedicated). Pt's wound vac now removed. Session with emphasis on functional mobility/transfers, generalized strengthening, dynamic standing balance/coordination, and improved activity tolerance. Pt performed WC mobility 73ft using BUE and supervision with 1 rest break. Stopped due to intense phantom limb pain spasm and transported remainder of way in WC total A. MD notified and present to assess pt. Pt transferred WC<>mat via lateral scoot with CGA with therapist stabilizing WC. Sit<>stand from mat with RW and min A x 4 trials with BUE support on RW. While standing pt performed the following exercises with CGA and verbal/visual cues for technique: -2x10 R hip flexion -2x10 R hip abduction -2x12 L single leg heel raise Pt reported increased "tightness" along L calf and performed seated LLE hamstring stretch with gait belt 3x20 seconds. Sit<>stand x 3 additional trials with min A and worked on dynamic standing balance tossing beanbags using LUE and CGA for balance x 3 trials. Scott from [employer] present to fit pt for new limb guard during session. Pt transferred mat<>WC via lateral scoot/squat<>pivot with CGA and therpaist blocking WC from sliding. Pt transported back to room in [facility] total A and requested to sit in recliner. Stand<>pivot WC<>recliner with RW and min A. Concluded session with pt sitting in recliner with all needs within reach and R residual limb elevated. Provided pt with fresh water.   Treatment  Session 2 Received pt sitting in recliner, pt agreeable to PT treatment, and reported pain 7-8/10 in L groin. RN notified and present to administer pain medication at end of session. Session with emphasis on functional mobility/transfers, toileting, generalized strengthening, dynamic standing balance/coordination, and improved activity tolerance. Pt reported urge to toilet and transferred sit<>stand from recliner with RW and heavy min A  (cues for LUE on armrest and RUE on RW) and stand<>pivot recliner<>bedside commode with RW and min A. Pt required total A to remove pants and able to void with increased time. Pt performed hygiene management with total A via anterior leans and pulled pants up to mid-thigs via lateral leans. Stand<>pivot bedside commode<>WC with RW and min A and sat in WC and washed hands at sink with supervision. Donned L shoe with max/total A and pt transported to/from room in [facility] total A for time management purposes. Pt with questions regarding stair navigation and wants to be aware of options to get up/down stairs to get to her sewing room. Recommended that pt avoid stairs until receiving prosthetic due to weakness/deconditioning, body habitus, and decreased balance but demonstrated shower chair technique. Pt asking if she can "bump up the stairs" however discouraged this idea for reasons mentioned above as well as difficulty getting LLE underneath her BOS to stand. Pt then asked about getting on/off floor. Recommended that pt not attempt to get on/off floor and if fall occurs, to call 911. Demonstrated 2 techniques for getting off floor to show pt difficulty associated with technique and high risk for LOB and potentially landing on R residual limb. Pt verbalized understanding and in agreement with therapist's recommendations.  Pt requested to return to bed and attempted stand<>pivot back to bed but ultimately unable due to weakness/fatigue. Therefore performed lateral scoot WC<>bed to R with mod  A and 2nd person stabilizing WC. Sit<>supine with supervision. Concluded session with pt sitting upright in bed, needs within reach, and bed alarm on.   Therapy Documentation Precautions:  Precautions Precautions: Fall Precaution Comments: body habitus Required Braces or Orthoses: Other Brace Other Brace: limb protector Restrictions Weight Bearing Restrictions: Yes RLE Weight Bearing: Non weight bearing  Therapy/Group: Individual Therapy Martin Majestic PT, DPT   06/10/2021, 7:29 AM

## 2021-06-10 NOTE — Progress Notes (Signed)
Occupational Therapy Session Note  Patient Details  Name: [name] MRN: [id number] Date of Birth: 04/07/1961  Today's Date: 06/10/2021 OT Individual Time: 3825-0539 OT Individual Time Calculation (min): 60 min    Short Term Goals: Week 1:  OT Short Term Goal 1 (Week 1): Patient will complete toilet transfer with consistent min A OT Short Term Goal 2 (Week 1): Patient will complete 1 step of toileting task OT Short Term Goal 3 (Week 1): Patient will perform sit<>stand with min A on first trial  Skilled Therapeutic Interventions/Progress Updates:    Treatment session with focus on self-care retraining, functional transfers, LB care, and sit > stand.  Pt received seated EOB with RN present administering morning meds.  Pt completed bed mobility supervision to assess pt weight.  Pt reports dizziness when returning to sitting EOB, however passed with 1-2 min rest.  Pt engaged in bathing seated EOB with lateral leans to wash perineal area and under abdominal pannus with setup and CGA when leaning.  Pt reports need to toilet.  Completed lateral scoot transfer to bariatric drop arm BSC with CGA and UE support on RW for anterior weight shift.  Pt completed UB dressing seated on BSC.  Pt unable to complete hygiene post BM due to back pain with reaching.  Therapist completed hygiene and assisted pt with donning shorts.  Pt completed lateral scoot transfer CGA back to bed and then engaged in standing from EOB with RW.  Pt requiring mod assist sit > stand and +2 for safety due to body habitus and to pull pants over hips.  Therapist educated pt on residual limb care, amputee support group info, and peer support info.  Pt will benefit from inspection mirror for residual limb care.  Pt remained seated EOB with RN to arrive after session to change dressings.  Therapy Documentation Precautions:  Precautions Precautions: Fall Precaution Comments: body habitus Required Braces or Orthoses: Other Brace Other  Brace: limb protector Restrictions Weight Bearing Restrictions: Yes RLE Weight Bearing: Non weight bearing General:   Vital Signs: Therapy Vitals Temp: 98 F (36.7 C) Temp Source: Oral Pulse Rate: 87 Resp: 16 BP: (!) 125/56 Patient Position (if appropriate): Lying Oxygen Therapy SpO2: 92 % O2 Device: Room Air Pain: Pain Assessment Pain Scale: 0-10 Pain Score: 6  Pain Type: Chronic pain Pain Location: Back Pain Orientation: Right;Left;Mid Pain Descriptors / Indicators: Aching Pain Frequency: Intermittent Pain Onset: Gradual Patients Stated Pain Goal: 3 Pain Intervention(s): Medication (See eMAR)    Therapy/Group: Individual Therapy  Rosalio Loud 06/10/2021, 8:53 AM

## 2021-06-11 DIAGNOSIS — S88111A Complete traumatic amputation at level between knee and ankle, right lower leg, initial encounter: Secondary | ICD-10-CM | POA: Diagnosis not present

## 2021-06-11 LAB — GLUCOSE, CAPILLARY
Glucose-Capillary: 154 mg/dL — ABNORMAL HIGH (ref 70–99)
Glucose-Capillary: 155 mg/dL — ABNORMAL HIGH (ref 70–99)
Glucose-Capillary: 133 mg/dL — ABNORMAL HIGH (ref 70–99)
Glucose-Capillary: 114 mg/dL — ABNORMAL HIGH (ref 70–99)

## 2021-06-11 NOTE — Progress Notes (Signed)
Physical Therapy Session Note  Patient Details  Name: [name] MRN: [id number] Date of Birth: March 18, 1961  Today's Date: 06/11/2021 PT Individual Time: 0800-0859 PT Individual Time Calculation (min): 29 min    Short Term Goals: Week 1:  PT Short Term Goal 1 (Week 1): STG=LTG due to LOS  Skilled Therapeutic Interventions/Progress Updates:  Session 1: Pt received seated on BSC agreeable to therapy with no c/o pain. PT session focused on wound care, functional transfers and activity tolerance. Pt assisted with cleaning. Pt then returned to bed with R lateral scoot and CGA. MD entered room for follow up. Pt noted to have bleeding at wound site, thus PT assisted MD with wound dressing as requested. After, pt completed 2 sets of 5x sit to stand with RW + CGA in 25 secs. Pt then completed 4x L SST transfer with RW + CGA to WC. At end of session, pt was left seated EOB with alarm engaged, call bell and all needs in reach.  Session 2: Pt found sleeping in room. Pt reported extreme fatigue and weakness, feeling like she "cannot move". Pt politely declined session.   Therapy Documentation Precautions:  Precautions Precautions: Fall Precaution Comments: body habitus Required Braces or Orthoses: Other Brace Other Brace: limb protector Restrictions Weight Bearing Restrictions: Yes RLE Weight Bearing: Non weight bearing General: PT Amount of Missed Time (min): 45 Minutes PT Missed Treatment Reason: Patient fatigue  Pain: Pain Assessment Pain Scale: 0-10 Pain Score: 0     Therapy/Group: Individual Therapy  Perrin Maltese, PT, DPT 06/11/2021, 2:09 PM
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
## 2021-06-11 NOTE — Progress Notes (Signed)
Orthopedic Tech Progress Note Patient Details:  Jenalyn Girdner Dietzman February 17, 1961 [id number]  2XL and 3XL BK shrinker called into [facility] at [phone].  Patient ID: KINZLEY SAVELL, female   DOB: 1961-08-08, 60 y.o.   MRN: [id number]  Docia Furl 06/11/2021, 9:19 AM

## 2021-06-11 NOTE — IPOC Note (Signed)
Overall Plan of Care [facility]) Patient Details Name: [name] MRN: [id number] DOB: July 24, 1961  Admitting Diagnosis: Below-knee amputation of right lower extremity [facility])  Hospital Problems: Principal Problem:   Below-knee amputation of right lower extremity (HCC) Active Problems:   Unilateral complete BKA, right, subsequent encounter (HCC)     Functional Problem List: Nursing Edema, Endurance, Medication Management, Pain, Sensory, Skin Integrity, Safety  PT Balance, Edema, Endurance, Motor, Nutrition, Pain, Sensory, Skin Integrity  OT Balance, Endurance, Motor, Pain, Safety, Skin Integrity  SLP    TR         Basic ADL's: OT Bathing, Dressing, Toileting     Advanced  ADL's: OT       Transfers: PT Bed Mobility, Bed to Chair, Car, Occupational psychologist, Research scientist (life sciences): PT Ambulation, Psychologist, prison and probation services, Stairs     Additional Impairments: OT None  SLP        TR      Anticipated Outcomes Item Anticipated Outcome  Self Feeding    Swallowing      Basic self-care  Min/CGA  Toileting  Min A   Bathroom Transfers Min/CGA  Bowel/Bladder  n/a  Transfers  supervision with LRAD  Locomotion  CGA with LRAD  Communication     Cognition     Pain  < 3  Safety/Judgment  supervision and no falls   Therapy Plan: PT Intensity: Minimum of 1-2 x/day ,45 to 90 minutes PT Frequency: 5 out of 7 days PT Duration Estimated Length of Stay: 7-10 days OT Intensity: Minimum of 1-2 x/day, 45 to 90 minutes OT Frequency: 5 out of 7 days OT Duration/Estimated Length of Stay: 10-12     Due to the current state of emergency, patients may not be receiving their 3-hours of Medicare-mandated therapy.   Team Interventions: Nursing Interventions Patient/Family Education, Disease Management/Prevention, Pain Management, Medication Management, Skin Care/Wound Management, Discharge Planning, Psychosocial Support  PT interventions Ambulation/gait training, Discharge  planning, Functional mobility training, Psychosocial support, Therapeutic Activities, Balance/vestibular training, Disease management/prevention, Neuromuscular re-education, Skin care/wound management, Therapeutic Exercise, Wheelchair propulsion/positioning, DME/adaptive equipment instruction, Pain management, Splinting/orthotics, UE/LE Strength taining/ROM, Community reintegration, Equities trader education, Museum/gallery curator, UE/LE Coordination activities  OT Interventions Warden/ranger, Firefighter, Discharge planning, Disease mangement/prevention, Fish farm manager, Functional mobility training, Pain management, Patient/family education, Psychosocial support, Self Care/advanced ADL retraining, Skin care/wound managment, UE/LE Strength taining/ROM, Therapeutic Exercise, Therapeutic Activities, Wheelchair propulsion/positioning, UE/LE Coordination activities  SLP Interventions    TR Interventions    SW/CM Interventions Discharge Planning, Psychosocial Support, Patient/Family Education, Disease Management/Prevention   Barriers to Discharge MD  Medical stability, Wound care, and Weight  Nursing Decreased caregiver support, Home environment access/layout, Wound Care, Lack of/limited family support, Weight, Weight bearing restrictions, Medication compliance Lives in a 2 level home with ramped entrance. Lives with spouse. Able to live on main level with bedroom/bathroom. Spouse can provide 24/7 assist.  PT Wound Care, Weight, Weight bearing restrictions, Other (comments) pain, RLE NWB status, body habitus, wound care  OT      SLP      SW Wound Care, Insurance for SNF coverage [facility],   Team Discharge Planning: Destination: PT-Home ,OT- Home , SLP-  Projected Follow-up: PT-Home health PT, OT-  Home health OT, SLP-  Projected Equipment Needs: PT-To be determined, OT- Other (comment), SLP-  Equipment Details: PT-has RW and rollator. Has WC but it is old and  broken., OT-bariatric drop arm BSC Patient/family involved in discharge planning: PT- Patient,  OT-Patient, SLP-  MD ELOS: 4-6d Medical Rehab Prognosis:  Good Assessment:  60 year old female with history of [name], NAFL, CAD/chronic diastolic CHF, chronic pain-on ocycodone, super morbid obesity--BMI 62, Sjogren's/mixed connective tissure disorder- on plaquenil, lymphedema right foot/shin ulcers as well as multiple other ulcers followed by wound clinic who was admitted on 05/26/21 with sepsis from Enterococcus faecalis bacteremia secondary to cellulitis and chronic diabetic foot ulcer. History taken from chart review and patient. She was started on broad spectrum antibiotics and amputation recommended due to failure of conservative therapy. She underwent right BKA by Dr. Lajoyce Corners on 06/01/21 and TEE done at that time was negative for endocarditis or valvular disease. Dr. Earlene Plater recommended  transition to ampicillin as anerobic coverage not indicated post amputation-->continue for 7 days post OR thorough 09/28 to treat bacteremia. Wound Vac was applied for 7 days. Post op has had issues with significant phantom pain but reports is feeling the "best she has felt in years". Blood sugars have been labile and Lantus added and insulin being titrated upwards.     See Team Conference Notes for weekly updates to the plan of care

## 2021-06-11 NOTE — Progress Notes (Signed)
Patient ID: [name], female   DOB: 05-30-1961, 60 y.o.   MRN: [id number] Patient is seen in follow-up status post right transtibial amputation.  Examination patient does have some draining resolving hematoma.  There is no cellulitis there is mild ischemic changes along these incision line most likely secondary to the swelling.  4 x 4's and an Ace wrap was applied with the 4 XL stump shrinker on top.  Order was placed for a 3 extra-large and 2 extra-large stump shrinker to facilitate decreased swelling.  Patient should start with a 3 XL and advance to the 2 XL stump shrinker as swelling resolves.

## 2021-06-11 NOTE — Progress Notes (Signed)
Occupational Therapy Session Note  Patient Details  Name: [name] MRN: [id number] Date of Birth: February 20, 1961  Today's Date: 06/11/2021 OT Individual Time: 8341-9622 and 2979-8921 OT Individual Time Calculation (min): 60 min and 27 min   Short Term Goals: Week 1:  OT Short Term Goal 1 (Week 1): Patient will complete toilet transfer with consistent min A OT Short Term Goal 2 (Week 1): Patient will complete 1 step of toileting task OT Short Term Goal 3 (Week 1): Patient will perform sit<>stand with min A on first trial  Skilled Therapeutic Interventions/Progress Updates:    Session 1: OT session focused on ADL retraining, sit<>stand, and endurance. Pt received sitting EOB requesting to sponge bathe. Pt completed with min A requiring assist for thoroughness of peri care and to bathe LLE. Pt completed sit<>stand 2x during ADLs with min A and OT assisting to manage pants around waist. OT educated pt on use of reacher for donning pants, completing with min A. Pt completed SPT transfers bed<>w/c with min A using RW. Pt completed dynamic standing balance task 3x 30-40 seconds with CGA for balance. At end of session, pt left sitting in bed with bed alarm on and all needs in reach.   Session 2: OT session focused on UB and core strength required for transfers. Pt received sitting EOB agreeable to transition to gym. Completed sit>stand with heavy min A with pt reporting increased fatigue and dizziness. BP 136/62 and HR 92. Provided rest then pt requested to remain in room. Completed trunk rotation 3x 20 reps with 3.3# ball and shoulder to overhead with 3.3# 3x20 reps. Max fatigue noted as pt falling asleep in bed. Completed scooting towards HOB with min A to block LLE. Pt transitioned to supine with min A and immediately falling asleep. Pt left with all needs in reach and bed alarm on.   Therapy Documentation Precautions:  Precautions Precautions: Fall Precaution Comments: body habitus Required  Braces or Orthoses: Other Brace Other Brace: limb protector Restrictions Weight Bearing Restrictions: Yes RLE Weight Bearing: Non weight bearing General:   Vital Signs:  Pain: Pt reported 5-6 out of 10 pain during session.   ADL: ADL Eating: Independent Grooming: Supervision/safety, Setup Upper Body Bathing: Minimal assistance Lower Body Bathing: Maximal assistance Upper Body Dressing: Minimal assistance Lower Body Dressing: Maximal assistance Toileting: Maximal assistance Toilet Transfer: Moderate assistance, Minimal assistance Tub/Shower Transfer: Unable to assess Vision   Perception    Praxis   Exercises:   Other Treatments:     Therapy/Group: Individual Therapy  Daneil Dan 06/11/2021, 12:15 PM

## 2021-06-12 DIAGNOSIS — S88111A Complete traumatic amputation at level between knee and ankle, right lower leg, initial encounter: Secondary | ICD-10-CM | POA: Diagnosis not present

## 2021-06-12 LAB — GLUCOSE, CAPILLARY
Glucose-Capillary: 150 mg/dL — ABNORMAL HIGH (ref 70–99)
Glucose-Capillary: 122 mg/dL — ABNORMAL HIGH (ref 70–99)
Glucose-Capillary: 137 mg/dL — ABNORMAL HIGH (ref 70–99)
Glucose-Capillary: 90 mg/dL (ref 70–99)

## 2021-06-12 MED ORDER — FLUCONAZOLE 100 MG PO TABS
100.0000 mg | ORAL_TABLET | Freq: Every day | ORAL | Status: AC
  Administered 2021-06-13 – 2021-06-18 (×6): 100 mg via ORAL
  Filled 2021-06-12 (×6): qty 1

## 2021-06-12 MED ORDER — FLUCONAZOLE 100 MG PO TABS
200.0000 mg | ORAL_TABLET | Freq: Once | ORAL | Status: AC
  Administered 2021-06-12: 200 mg via ORAL
  Filled 2021-06-12: qty 2

## 2021-06-12 NOTE — Progress Notes (Signed)
Occupational Therapy Session Note  Patient Details  Name: [name] MRN: [id number] Date of Birth: 1961/08/31  Today's Date: 06/12/2021 OT Individual Time: 1030-1100 OT Individual Time Calculation (min): 30 min    Short Term Goals: Week 1:  OT Short Term Goal 1 (Week 1): Patient will complete toilet transfer with consistent min A OT Short Term Goal 2 (Week 1): Patient will complete 1 step of toileting task OT Short Term Goal 3 (Week 1): Patient will perform sit<>stand with min A on first trial  Skilled Therapeutic Interventions/Progress Updates:    Treatment session with focus on sit > stand and stand pivot transfers.   Pt received supine in bed willing to engage in make up time from missed therapy session yesterday.  Pt completed bed mobility supervision and engaged in sit > stand x5 from EOB with CGA while pushing up from bariatric RW.  Therapist educated on hand placement with pt stating feeling unsteady when pushing up from bed with either hand, preferring to push up from RW with BUE.  Pt maintained standing 30-45 seconds during each stand.  Pt with 1 bout of "squeezing" "spasm" pain in residual limb requiring pt to sit.  Educated on various techniques for pain management with pt stating that "nothing helps" in particular situation.  Pt reports need to toilet.  Completed stand pivot with RW to bariatric BSC with CGA.  Pt left on toilet with all needs in reach, nurse tech aware of pt location.  Therapy Documentation Precautions:  Precautions Precautions: Fall Precaution Comments: body habitus Required Braces or Orthoses: Other Brace Other Brace: limb protector Restrictions Weight Bearing Restrictions: Yes RLE Weight Bearing: Non weight bearing Pain: Pain Assessment Pain Scale: 0-10 Pain Score: 6  Faces Pain Scale: No hurt Pain Type: Acute pain;Surgical pain Pain Location: Back (amputation) Pain Orientation: Lower Pain Descriptors / Indicators: Aching Pain Frequency:  Intermittent Pain Onset: On-going Patients Stated Pain Goal: 1 Pain Intervention(s): Medication (See eMAR) Multiple Pain Sites: Yes   Therapy/Group: Individual Therapy  Rosalio Loud 06/12/2021, 11:11 AM
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
## 2021-06-13 ENCOUNTER — Encounter (HOSPITAL_BASED_OUTPATIENT_CLINIC_OR_DEPARTMENT_OTHER): Payer: 59 | Admitting: Internal Medicine

## 2021-06-13 DIAGNOSIS — S88111A Complete traumatic amputation at level between knee and ankle, right lower leg, initial encounter: Secondary | ICD-10-CM | POA: Diagnosis not present

## 2021-06-13 LAB — GLUCOSE, CAPILLARY
Glucose-Capillary: 145 mg/dL — ABNORMAL HIGH (ref 70–99)
Glucose-Capillary: 104 mg/dL — ABNORMAL HIGH (ref 70–99)
Glucose-Capillary: 127 mg/dL — ABNORMAL HIGH (ref 70–99)
Glucose-Capillary: 191 mg/dL — ABNORMAL HIGH (ref 70–99)

## 2021-06-13 LAB — CBC
HCT: 34.4 % — ABNORMAL LOW (ref 36.0–46.0)
Hemoglobin: 10.2 g/dL — ABNORMAL LOW (ref 12.0–15.0)
MCH: 25.1 pg — ABNORMAL LOW (ref 26.0–34.0)
MCHC: 29.7 g/dL — ABNORMAL LOW (ref 30.0–36.0)
MCV: 84.7 fL (ref 80.0–100.0)
Platelets: 398 10*3/uL (ref 150–400)
RBC: 4.06 MIL/uL (ref 3.87–5.11)
RDW: 16.4 % — ABNORMAL HIGH (ref 11.5–15.5)
WBC: 8.9 10*3/uL (ref 4.0–10.5)
nRBC: 0 % (ref 0.0–0.2)

## 2021-06-13 LAB — BASIC METABOLIC PANEL
Anion gap: 11 (ref 5–15)
BUN: 11 mg/dL (ref 6–20)
CO2: 27 mmol/L (ref 22–32)
Calcium: 9.3 mg/dL (ref 8.9–10.3)
Chloride: 101 mmol/L (ref 98–111)
Creatinine, Ser: 0.94 mg/dL (ref 0.44–1.00)
GFR, Estimated: >60 mL/min (ref >60)
Glucose, Bld: 140 mg/dL — ABNORMAL HIGH (ref 70–99)
Potassium: 4 mmol/L (ref 3.5–5.1)
Sodium: 139 mmol/L (ref 135–145)

## 2021-06-13 MED ORDER — TRAMADOL HCL 50 MG PO TABS
50.0000 mg | ORAL_TABLET | Freq: Four times a day (QID) | ORAL | Status: DC | PRN
  Administered 2021-06-13 – 2021-06-22 (×4): 50 mg via ORAL
  Filled 2021-06-13 (×5): qty 1

## 2021-06-13 MED ORDER — CYCLOBENZAPRINE HCL 5 MG PO TABS
5.0000 mg | ORAL_TABLET | Freq: Once | ORAL | Status: AC
  Administered 2021-06-13: 5 mg via ORAL
  Filled 2021-06-13: qty 1

## 2021-06-13 MED ORDER — OXYCODONE HCL 5 MG PO TABS: 15.0000 mg | ORAL_TABLET | ORAL | Status: DC | PRN

## 2021-06-13 MED ORDER — OXYCODONE HCL 5 MG PO TABS
20.0000 mg | ORAL_TABLET | ORAL | Status: DC | PRN
Start: 2021-06-13 — End: 2021-06-13

## 2021-06-13 MED ORDER — OXYCODONE HCL 5 MG PO TABS
20.0000 mg | ORAL_TABLET | ORAL | Status: DC | PRN
  Administered 2021-06-13 – 2021-06-24 (×22): 20 mg via ORAL
  Filled 2021-06-13 (×24): qty 4

## 2021-06-13 MED ORDER — PRAMIPEXOLE DIHYDROCHLORIDE 1 MG PO TABS
1.0000 mg | ORAL_TABLET | Freq: Three times a day (TID) | ORAL | Status: DC
  Administered 2021-06-13 – 2021-06-24 (×34): 1 mg via ORAL
  Filled 2021-06-13 (×35): qty 1

## 2021-06-13 MED ORDER — OXYCODONE HCL 5 MG PO TABS
15.0000 mg | ORAL_TABLET | ORAL | Status: DC | PRN
  Administered 2021-06-14 – 2021-06-24 (×11): 15 mg via ORAL
  Filled 2021-06-13 (×11): qty 3

## 2021-06-13 NOTE — Progress Notes (Signed)
Patient ID: [name], female   DOB: 11-Dec-1960, 60 y.o.   MRN: [id number]  Wheelchair, [employer], and amputee pad ordered through [employer].  [place], [place] [phone]

## 2021-06-13 NOTE — Progress Notes (Signed)
Physical Therapy Session Note  Patient Details  Name: [name] MRN: [id number] Date of Birth: May 30, 1961  Today's Date: 06/13/2021 PT Individual Time: 0800-0910 and 1415-1500 PT Individual Time Calculation (min): 70 min and 45 min PT Missed Time: 30 minutes PT Missed Time Reason: Pain  Short Term Goals: Week 1:  PT Short Term Goal 1 (Week 1): STG=LTG due to LOS  Skilled Therapeutic Interventions/Progress Updates:   Treatment Session 1 Received pt sitting EOB, pt agreeable to PT treatment, and reported pain 6/10 in R residual limb. RN notified and present to administer pain medication. Repositioning, rest breaks, and distraction done to reduce pain levels. Session with emphasis on dressing, functional mobility/transfers, toileting, generalized strengthening, dynamic standing balance/coordination, D/C planning, and improved activity tolerance. Pt reported urge to toilet and transferred bed<>bedside commode with bariatric RW and CGA. Pt able to void but required total A for peri-care via anterior leans. Stand<>pivot bedside commode<>bed with RW and CGA. Doffed dirty nightgown and donned bra with mod A and donned shirt, shorts, and L sock with supervision. Pt requested therapist print therapy schedule from 9/29 to keep for records. Pt then transferred bed<>WC with RW and CGA via pivoting on LLE rather than clearing foot and donned R limb guard with mod A. Discussed equipment for D/C and pt reports she has a standard pediatric RW at home but would like bariatric RW similar to the one she is currently using. Took measurements to fit pt for 22x18 hemi height WC with R amputee support pad and discussed having pt's husband come in for family education sometime this week; CSW notified of equipment recommendations and request to schedule family education. Pt transported to/from room in [facility] total A for time management purposes and performed seated LLE strengthening on Kinetron at 20 cm/sec for 1 minute x 3  trials with therapist providing manual counter resistance with emphasis on quad/glute strengthening. Pt suddenly reported having acid reflux and provided pt with emesis bag and applesauce. Pt with no active emesis episode and reported feeling better after having applesauce to "coat her throat". Pt then performed the following exercises sitting in [facility] with supervision and verbal cues for technique: -hip flexion 2x20 bilaterally  -LAQ 2x20 bilaterally  Concluded session with pt sitting in [facility] with all needs within reach and NT present at bedside.   Treatment Session 2 Received pt sitting EOB, pt agreeable to PT treatment, and did not state pain level but reported already receiving pain medications. Pt also reported fatigue from previous therapies but motivated to participate. Discussed equipment for D/C as pt reports insurance won't cover a new WC/RW. Recommended pt use current WC and make modifications to support her residual limb and if necessary get cushion for WC to raise seat height to make it easier to stand as pt reports not having removable armrests. Discussed car transfers and pt called husband to get seat height measurements for Zenaida Niece and agreeable to go to ortho gym to practice simulated car transfer. Pt transferred sit<>stand with bariatric RW and close supervision/CGA, but while pivoting to WC, L knee buckled resulting in controlled descent onto floor. Pt reported landing on her residual limb; unwrapped limb and drainage/bleeding found but staples remained intact and no other abnormalities found. Pt reported increased pain to 7/10 in limb but reported feeling "more shaken up than hurt". Provided emotional support and encouraged deep breathing while re-wrapping residual limb. Pt transferred long sitting on floor<>bed via Maximove with +3 assist. Pt very apologetic stating "please don't kick  me out of rehab", reassured pt that she would not be kicked out. Assisted pt with getting comfortable in bed and  elevated R residual limb for edema management. Concluded session with pt semi-reclined in bed, needs within reach, and bed alarm on. Provided pt with fresh drink.   Therapy Documentation Precautions:  Precautions Precautions: Fall Precaution Comments: body habitus Required Braces or Orthoses: Other Brace Other Brace: limb protector Restrictions Weight Bearing Restrictions: Yes RLE Weight Bearing: Non weight bearing  Therapy/Group: Individual Therapy Martin Majestic PT, DPT   06/13/2021, 7:12 AM

## 2021-06-13 NOTE — Progress Notes (Signed)
Patient ID: [name], female   DOB: 11/21/1960, 60 y.o.   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  Patient states she has had minimal drainage since I change the dressing Saturday.  She is currently in a 3 extra-large stump shrinker.  I will follow-up as an outpatient.
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
## 2021-06-13 NOTE — Progress Notes (Signed)
Occupational Therapy Session Note  Patient Details  Name: [name] MRN: [id number] Date of Birth: [birth year]  Today's Date: 06/13/2021 OT Individual Time: 1110-1155 OT Individual Time Calculation (min): 45 min    Short Term Goals: Week 1:  OT Short Term Goal 1 (Week 1): Patient will complete toilet transfer with consistent min A OT Short Term Goal 2 (Week 1): Patient will complete 1 step of toileting task OT Short Term Goal 3 (Week 1): Patient will perform sit<>stand with min A on first trial  Skilled Therapeutic Interventions/Progress Updates:    Treatment session with focus on BUE strengthening and endurance. Pt agreeable to additional therapy session, as originally not scheduled for OT today.  Pt reports working BLE during PT sessions and requesting to focus on UE strength.  Pt transported to therapy gym and engaged in 8 min on UBE arm bike with 4 mins forward and 4 mins backward on resistance level 6.4. Therapist then directed pt in 3 sets of 10 chest presses and overhead presses with 2# dowel rod.  Therapist directed client in 2 sets of 10 overhead tricep extension and 1 set of 10 tricep dips with 2# hand weight before reporting fatigue.  Pt took self-selected rest breaks between sets but then reporting too fatigued to finish last set of tricep dips.  Pt returned to room and remained upright in w/c with all needs in reach.  RN arriving to provide scheduled meds.  Therapy Documentation Precautions:  Precautions Precautions: Fall Precaution Comments: body habitus Required Braces or Orthoses: Other Brace Other Brace: limb protector Restrictions Weight Bearing Restrictions: Yes RLE Weight Bearing: Non weight bearing  Pain: Pain Assessment Pain Score: 3    Therapy/Group: Individual Therapy  Aniella Wandrey 06/13/2021, 12:12 PM

## 2021-06-13 NOTE — Progress Notes (Signed)
   06/13/21 1448  What Happened  Was fall witnessed? Yes  Who witnessed fall? Tobi Bastos PT  Patients activity before fall to/from bed, chair, or stretcher  Follow Up  MD notified Delle Reining, PA  Time MD notified 1500  Family notified No - patient refusal  Simple treatment Ice (dressing change)  Adult Fall Risk Assessment  Risk Factor Category (scoring not indicated) Fall has occurred during this admission (document High fall risk)  Patient Fall Risk Level High fall risk  Adult Fall Risk Interventions  Required Bundle Interventions *See Row Information* High fall risk - low, moderate, and high requirements implemented  Additional Interventions Use of appropriate toileting equipment (bedpan, BSC, etc.);Lap belt while in chair/wheelchair (Rehab only);PT/OT need assessed if change in mobility from baseline  Screening for Fall Injury Risk (To be completed on HIGH fall risk patients) - Assessing Need for Floor Mats  Risk For Fall Injury- Criteria for Floor Mats Previous fall this admission  Vitals  Temp 98.4 F (36.9 C)  Temp Source Oral  BP (!) 149/75  MAP (mmHg) 98  BP Location Left Arm  BP Method Automatic  Patient Position (if appropriate) Lying  Pulse Rate 88  Pulse Rate Source Monitor  Resp 19  Oxygen Therapy  SpO2 96 %  O2 Device Room Air  Neurological  Neuro (WDL) WDL  Level of Consciousness Alert  Orientation Level Oriented X4  Glasgow Coma Scale  Eye Opening 4  Best Verbal Response (NON-intubated) 5  Modified Verbal Response (INTUBATED) 5  Best Motor Response 6  Glasgow Coma Scale Score (!) 20  Musculoskeletal  Musculoskeletal (WDL) X  Assistive Device Front wheel walker  Generalized Weakness Yes  Weight Bearing Restrictions Yes  RLE Weight Bearing NWB  Musculoskeletal Details  RLE BKA  Integumentary  Integumentary (WDL) X  Skin Integrity Surgical Incision (see LDA)

## 2021-06-13 NOTE — Progress Notes (Signed)
Patient fell while attempting to transfer with PT. Has some pain in stump but also feels that back was strained with back>stump pain. Extra dose flexeril and ultram administered in addition to ice to back and incisiion.  Compressive dressing applied--incision intact with a couple of areas of oozing and flap remains tight with edema but no abrasion or bruising noted.    Medial aspect  Lateral aspect  Inferior aspect

## 2021-06-13 NOTE — Progress Notes (Signed)
Patient ID: [name], female   DOB: 05-15-1961, 60 y.o.   MRN: [id number]  Family education scheduled with patient spouse on Wednesday 06/15/2021 8-12  [place], [place] [phone]

## 2021-06-13 NOTE — Progress Notes (Signed)
Physical Therapy Session Note  Patient Details  Name: [name] MRN: [id number] Date of Birth: [birth year]  Today's Date: 06/13/2021 PT Individual Time: 1000-1045 PT Individual Time Calculation (min): 45 min   Short Term Goals: Week 1:  PT Short Term Goal 1 (Week 1): STG=LTG due to LOS  Skilled Therapeutic Interventions/Progress Updates:     Pt in w/c at start of session - she's agreeable to PT tx without reports of pain. Donned R limb guard with totalA for time. Propels herself ~84ft in w/c with supervision using BUE's - decreased shldr extension limiting propulsion efforts. Pt transported remaining distance to main rehab gym for time. Donned L TED and L shoe with totalA for time and provided elastic laces for her shoe. Sit<>stand from w/c to bari RW with CGA and then completes stand<>pivot transfer to mat table with RW and CGA for safety. Instructed on 2x10 sit<>stands to bari RW from low mat table surface to challenge endurance and LLE strengthening. Assisted to long sitting with supervision via backwards reciprocal scooting and then worked on supine there-ex including RLE hip abduction and SLR. Pt with increased lower back pain while laying supine - limiting tolerance to exercises. Requires minA for supine<>sitting for trunk elevation. Stand<>pivot transfer with RW and CGA and then returned to her room where she remained seated in w/c with all needs in reach.   Therapy Documentation Precautions:  Precautions Precautions: Fall Precaution Comments: body habitus Required Braces or Orthoses: Other Brace Other Brace: limb protector Restrictions Weight Bearing Restrictions: Yes RLE Weight Bearing: Non weight bearing General:    Therapy/Group: Individual Therapy  Orrin Brigham 06/13/2021, 7:37 AM

## 2021-06-14 ENCOUNTER — Encounter (HOSPITAL_BASED_OUTPATIENT_CLINIC_OR_DEPARTMENT_OTHER): Payer: 59 | Admitting: Internal Medicine

## 2021-06-14 DIAGNOSIS — S88111A Complete traumatic amputation at level between knee and ankle, right lower leg, initial encounter: Secondary | ICD-10-CM | POA: Diagnosis not present

## 2021-06-14 LAB — GLUCOSE, CAPILLARY
Glucose-Capillary: 167 mg/dL — ABNORMAL HIGH (ref 70–99)
Glucose-Capillary: 120 mg/dL — ABNORMAL HIGH (ref 70–99)
Glucose-Capillary: 107 mg/dL — ABNORMAL HIGH (ref 70–99)
Glucose-Capillary: 122 mg/dL — ABNORMAL HIGH (ref 70–99)

## 2021-06-14 MED ORDER — ASCORBIC ACID 500 MG PO TABS
500.0000 mg | ORAL_TABLET | Freq: Two times a day (BID) | ORAL | Status: DC
  Administered 2021-06-14 – 2021-06-24 (×20): 500 mg via ORAL
  Filled 2021-06-14 (×20): qty 1

## 2021-06-14 MED ORDER — ZINC SULFATE 220 (50 ZN) MG PO CAPS
220.0000 mg | ORAL_CAPSULE | Freq: Every day | ORAL | Status: DC
  Administered 2021-06-14 – 2021-06-24 (×11): 220 mg via ORAL
  Filled 2021-06-14 (×11): qty 1

## 2021-06-14 NOTE — Progress Notes (Signed)
Occupational Therapy Session Note  Patient Details  Name: [name] MRN: [id number] Date of Birth: [birth year]  Today's Date: 06/14/2021 OT Individual Time: 1100-1200 OT Individual Time Calculation (min): 60 min    Short Term Goals: Week 1:  OT Short Term Goal 1 (Week 1): Patient will complete toilet transfer with consistent min A OT Short Term Goal 2 (Week 1): Patient will complete 1 step of toileting task OT Short Term Goal 3 (Week 1): Patient will perform sit<>stand with min A on first trial  Skilled Therapeutic Interventions/Progress Updates:    Treatment session with focus on self-care retraining, care of residual limb, and sit > stand.  Pt received upright in w/c reporting sore and pain in RLE.  Pt reports losing her confidence s/p fall during therapy yesterday.  Pt engaged in UB bathing seated at sink with setup for items.  Attempted to stand from w/c with mod-max assist from therapist but pt unable to come fully upright due to fear and reports of LLE weakness.  Discussed options for LB bathing/dressing at sit > stand vs EOB with lateral leans.  Pt opted for lateral leans from EOB.  Pt completed lateral scoot transfer to bed with CGA.  Pt utilized toilet wand when washing buttocks and perineal area and under pannus.  Pt pulled shorts up and down with lateral leans and supervision.  Therapist educated on use of inspection mirror and demonstrated use, educating on areas of concerns and when to notify MD.  Discussed local peer support options with pt very interested.  Pt completed sit >stand from EOB x2 with RW and min assist.  Pt able to stand 25-30 seconds before reporting fatigue and instability in LLE.  Pt left seated EOB with bed alarm on and all needs in reach.  Therapy Documentation Precautions:  Precautions Precautions: Fall Precaution Comments: body habitus Required Braces or Orthoses: Other Brace Other Brace: limb protector Restrictions Weight Bearing Restrictions:  Yes RLE Weight Bearing: Non weight bearing Pain: Pain Assessment Pain Scale: 0-10 Pain Score: 9  Pain Type: Surgical pain Pain Location: Incision Pain Orientation: Right Pain Descriptors / Indicators: Aching Pain Frequency: Constant Pain Onset: On-going Pain Intervention(s): Medication (See eMAR)    Therapy/Group: Individual Therapy  Rosalio Loud 06/14/2021, 12:16 PM

## 2021-06-14 NOTE — Patient Care Conference (Signed)
Inpatient RehabilitationTeam Conference and Plan of Care Update Date: 06/14/2021   Time: 1:39 PM    Patient Name: [name]      Medical Record Number: [id number]  Date of Birth: [birth year] Sex: Female         Room/Bed: 4W02C/4W02C-01 Payor Info: Payor: [employer]  / Plan: [employer] / Product Type: *No Product type* /    Admit Date/Time:  06/08/2021 10:40 PM  Primary Diagnosis:  Below-knee amputation of right lower extremity [facility])  Hospital Problems: Principal Problem:   Below-knee amputation of right lower extremity (HCC) Active Problems:   Unilateral complete BKA, right, subsequent encounter [facility])    Expected Discharge Date: Expected Discharge Date: 06/18/21  Team Members Present: Physician leading conference: Dr. Sula Soda Social Worker Present: Lavera Guise, BSW Nurse Present: Kennyth Arnold, RN PT Present: Rada Hay, PT OT Present: Other (comment) [facility] Po[facility]ter, OT)     Current Status/Progress Goal Weekly Team Focus  Bowel/Bladder   cont of Bowel and Bladder. Last BM-10/1  mainatain cont.  assess q shift and PRN   Swallow/Nutrition/ Hydration             ADL's   CGA stand pivot transfers to [facility] wtih RW, Min A bathing and dressing, requiring total assist for footwear  CGA overall, Min A LB dressing and toileting  ADL retraining especially LB dressing, AE education, transfers, dynamic standing balance, activity tolerance/endurance, care of residual limb   Mobility   bed mobility supervision, transfers with bariatric RW CGA/min A, lateral scoots CGA, unable to ambulate at this time due to weakness, pain, and body habitus.  Supervision overall, CGA gait and car transfers, Mod I bed mobility  pain management, functional mobility/transfers, amputee education, generalized strengthening, dynamic standing balance/coordination, endurance, and D/C planning.   Communication             Safety/Cognition/ Behavioral Observations             Pain   pain to stump 9 of 10  pain<3  assess pain q shift and PRN   Skin   surgical incision R BKA- staples , wounds to R breast, Lower back- Santyl.wound to Labia - OTA.  proper healing of surgical site and wounds.  assess skin q shift and PRN     Discharge Planning:  Discharging home with spouse   Team Discussion: Had a fall yesterday, hyperglycemic, doing well, and trying to minimize Oxy use. Continent B/B, LBM 06/12/21. Reports 10/10 pain. Tylenol, Oxy IR, Tramadol, and Flexeril available. Wounds cleaned and dressed daily. Transfers with RW. Working on standing with RW. Did have some limb drainage today. Almost supervision for STS. Complains of weakness. Mobility is a barrier. May extend but will evaluate later in week. Patient on target to meet rehab goals: yes  *See Care Plan and progress notes for long and short-term goals.   Revisions to Treatment Plan:  MD adjusting pain medications.  Teaching Needs: Family education, medication management, pain management, skin/wound care, residual limb care, weight bearing precautions, transfer training, gait training, balance training, endurance training, safety awareness.  Current Barriers to Discharge: Decreased caregiver support, Medical stability, Home enviroment access/layout, Wound care, Lack of/limited family support, Weight, Weight bearing restrictions, and Medication compliance  Possible Resolutions to Barriers: Continue current pain medications to manage pain, continue wound care and teach family, address residual limb care and teach family and patient, provide emotional support.     Medical Summary Current Status: uncontrolled type 2 DM, residual limb and  phantom limb pain, BMI 62.14, s/p fall  Barriers to Discharge: Medical stability;Wound care;Weight  Barriers to Discharge Comments: uncontrolled type 2 DM, residual limb and phantom limb pain, BMI 62.14, s/p fall Possible Resolutions to [employer] Focus: continue insulin  and metformin, continue oxycodone/tramadol/flexeril, continue to provide dietary education, monitor wound daily   Continued Need for Acute Rehabilitation Level of Care: The patient requires daily medical management by a physician with specialized training in physical medicine and rehabilitation for the following reasons: Direction of a multidisciplinary physical rehabilitation program to maximize functional independence : Yes Medical management of patient stability for increased activity during participation in an intensive rehabilitation regime.: Yes Analysis of laboratory values and/or radiology reports with any subsequent need for medication adjustment and/or medical intervention. : Yes   I attest that I was present, lead the team conference, and concur with the assessment and plan of the team.   Tennis Must 06/14/2021, 2:25 PM

## 2021-06-14 NOTE — Progress Notes (Signed)
Kpad added for lower back pain. Iv site leaking when flushed, removed. Pt tolerated well. No c/o at this time.

## 2021-06-14 NOTE — Progress Notes (Signed)
Occupational Therapy Session Note  Patient Details  Name: [name] MRN: [id number] Date of Birth: Mar 11, 1961  Today's Date: 06/14/2021 OT Individual Time: 1400-1514 OT Individual Time Calculation (min): 74 min    Short Term Goals: Week 1:  OT Short Term Goal 1 (Week 1): Patient will complete toilet transfer with consistent min A OT Short Term Goal 2 (Week 1): Patient will complete 1 step of toileting task OT Short Term Goal 3 (Week 1): Patient will perform sit<>stand with min A on first trial   Skilled Therapeutic Interventions/Progress Updates:    Pt greeted at time of session sitting EOB finishing up conversation with SW. Pt stating she is not feeling well today, feeling very fatigued and politely not wanting to participate in OT session. Discussion with pt regarding hesitancy, fear of falling 2/2 recent fall. With encouragement, agreeable to go outside for fresh air. Stand pivot bed > wheelchair CGA with bariwalker and hopping on LLE. Pt partially self propelled in hall approx 30 feet in hall before fatigued, also applied coban to wrap down IV in arm. Transported outside and focused on uplifting mood, discussion for DC planning, and BUE there ex with red theraband for shoulders/scap in various planes and movements with pt familiar with these exercises. Back inside and needing to toilet, unable to sit > stand from low w/c height and difficulty with squat pivot toward RLE 2/2 sequenbcing difficulties and fear of falling. Rearranged and squat pivot/scoot w/c > bed <> DABSC with CGA. Min A for hygiene posteriorly. Clothing management with lateral leans. Pt able to direct her care throughout session. Pt in bed resting alarm on call bell in reach.     Therapy Documentation Precautions:  Precautions Precautions: Fall Precaution Comments: body habitus Required Braces or Orthoses: Other Brace Other Brace: limb protector Restrictions Weight Bearing Restrictions: Yes RLE Weight Bearing:  Non weight bearing     Therapy/Group: Individual Therapy  Erasmo Score 06/14/2021, 1:00 PM

## 2021-06-14 NOTE — Progress Notes (Signed)
Physical Therapy Session Note  Patient Details  Name: [name] MRN: [id number] Date of Birth: [birth year]  Today's Date: 06/14/2021 PT Individual Time: 0900-1005 and 1300-1329 PT Individual Time Calculation (min): 65 min  and 29 min  Short Term Goals: Week 1:  PT Short Term Goal 1 (Week 1): STG=LTG due to LOS Week 2:    Week 3:     Skilled Therapeutic Interventions/Progress Updates:    AM SESSION Pt initially seated on commode w/NT providing hygiene assist.  Pt performed upper body dressing w/min assist for bra only, otherwise set up, max assist for lower body in sitting and standing Pt appeared very anxious and concerned about fall sustained last date.   States she is "a little sore" but does not feel she is injured otherwise.  Assured pt of safety set up and pt agreed to attempt standing for completion of dressing.  Sit to stand from [employer] to bariwalker w/min assist.  Stood w min assist /total assist for raising pants.  Returned to sitting.  Discussed transfer options/pt aagreed to attempt stand pivot transfer w/walker.  stand pivot transfer BSC to wc using bariwalker and min assist.   From wc level pt performs oral hygiene.   Wc propulsion w/bilat Ues x 37ft for strengthening. Wc pushups 2 x 5 w/cues to increase ant wt shift w/activity to promote improved independence w/Sit to stand Pt educated on parts management, practiced winging away legrests moving limbs on/off legrests, locking brakes. Wc retropulsion using LLE only 45ft x 3 for LE strengfthening. Scapular retraction 2 x15 w/red theraband resistance Horizontal abd 2x15 w/red theraband ressistance. Pt transported to room.  Pt left oob in wc w/alarm belt set and needs in reach.  Nursing notified pt requesting pain meds.    PM SESSION C/o 6/10 residual limb pain.  See below for dressing change/acewrap.   Pt concerned RLE dressing has not been changed since yesterday.  Therapist agreed to remove wrap/dressing.  Noted increased  drainage on dressing, nursing and PA notified and in to inspect wound.  Therapist photographed wound w/pt.s own phone for pt to share w/MD if requeted.  Nursing applied clean dressing, therapist acwrapped residual limb.  Discusssed shaping of limb w/compression.   Sit to stand from bed to bariwalker w/cga, stood > 1 min x 2 w/cga, therapist guarding L knee for safety. Pt left sitting on edge of bed as found w/rails up x 3, alarm set, bed in lowest position, and needs in reach.  Therapy Documentation Precautions:  Precautions Precautions: Fall Precaution Comments: body habitus Required Braces or Orthoses: Other Brace Other Brace: limb protector Restrictions Weight Bearing Restrictions: Yes RLE Weight Bearing: Non weight bearing     Therapy/Group: Individual Therapy Rada Hay, PT   Shearon Balo 06/14/2021, 10:16 AM

## 2021-06-14 NOTE — Progress Notes (Signed)
PROGRESS NOTE   Subjective/Complaints: Had a fall yesterday that scared her- she fell on the residual limb and had sharp pain at the time, pain is now better. Appreciate Rinaldo Cloud taking photos and contacting Dr. Audrie Lia office  ROS: thrush-  Pt denies SOB, abd pain, CP, N/V/C/D, and vision changes, +low back pain- improved, +residual limb pain  Objective:   No results found. Recent Labs    06/13/21 0544  WBC 8.9  HGB 10.2*  HCT 34.4*  PLT 398   Recent Labs    06/13/21 0544  NA 139  K 4.0  CL 101  CO2 27  GLUCOSE 140*  BUN 11  CREATININE 0.94  CALCIUM 9.3    Intake/Output Summary (Last 24 hours) at 06/14/2021 1040 Last data filed at 06/14/2021 0830 Gross per 24 hour  Intake 1110 ml  Output --  Net 1110 ml        Physical Exam: Vital Signs Blood pressure 136/71, pulse 92, temperature 98.2 F (36.8 C), temperature source Oral, resp. rate 18, height 5\' 2"  (1.575 m), weight (!) 154.1 kg, SpO2 99 %. Gen: no distress, normal appearing, BMI 62.14   General: awake, alert, appropriate, sitting up EOB; NAD HENT: conjugate gaze; oropharynx dry, and has thrush and erythema on sides of mouth as well CV: regular rate; no JVD Pulmonary: CTA B/L; no W/R/R- good air movement GI: soft, NT, ND, (+)BS Psychiatric: appropriate Neurological: Ox3  Psych: pleasant, normal affect, bright and positive.  Skin:   Image from 9/28, see ortho note 10/1 Neuro: Alert and oriented x3 Musculoskeletal: Right sided residual limb with good range of motion, shrinker in place. 30-45 second standing tolerance Motor: LLE: HF 3--3/5, KE 4+/5, ADF 5/5 RLE: HF, KE 4-/5 (some pain inhibition)    Assessment/Plan: 1. Functional deficits which require 3+ hours per day of interdisciplinary therapy in a comprehensive inpatient rehab setting. Physiatrist is providing close team supervision and 24 hour management of active medical problems listed  below. Physiatrist and rehab team continue to assess barriers to discharge/monitor patient progress toward functional and medical goals  Care Tool:  Bathing    [name]: Right arm, Left arm, Chest, Abdomen, Buttocks, Left upper leg, Right upper leg, Face   [name]: Left lower leg, Front perineal area Body parts n/a: Right lower leg   Bathing assist Assist Level: Minimal Assistance - Patient > 75%     Upper Body Dressing/Undressing Upper body dressing   What is the patient wearing?: Pull over shirt, Bra    Upper body assist Assist Level: Minimal Assistance - Patient > 75%    Lower Body Dressing/Undressing Lower body dressing    Lower body dressing activity did not occur: Refused What is the patient wearing?: Underwear/pull up, Pants     Lower body assist Assist for lower body dressing: Minimal Assistance - Patient > 75%     Toileting Toileting    Toileting assist Assist for toileting: Minimal Assistance - Patient > 75%     Transfers Chair/bed transfer  Transfers assist     Chair/bed transfer assist level: Contact Guard/Touching assist     Locomotion Ambulation   Ambulation  assist   Ambulation activity did not occur: Safety/medical concerns (pain, weakness, decreased balance/postural control)          Walk 10 feet activity   Assist  Walk 10 feet activity did not occur: Safety/medical concerns (pain, weakness, decreased balance/postural control)        Walk 50 feet activity   Assist Walk 50 feet with 2 turns activity did not occur: Safety/medical concerns (pain, weakness, decreased balance/postural control)         Walk 150 feet activity   Assist Walk 150 feet activity did not occur: Safety/medical concerns (pain, weakness, decreased balance/postural control)         Walk 10 feet on uneven surface  activity   Assist Walk 10 feet on uneven surfaces activity did not occur: Safety/medical  concerns (pain, weakness, decreased balance/postural control)         Wheelchair     Assist Is the patient using a wheelchair?: Yes Type of Wheelchair: Manual    Wheelchair assist level: Supervision/Verbal cueing Max wheelchair distance: 44ft    Wheelchair 50 feet with 2 turns activity    Assist        Assist Level: Dependent - Patient 0%   Wheelchair 150 feet activity     Assist      Assist Level: Dependent - Patient 0%   Blood pressure 136/71, pulse 92, temperature 98.2 F (36.8 C), temperature source Oral, resp. rate 18, height 5\' 2"  (1.575 m), weight (!) 154.1 kg, SpO2 99 %.  Medical Problem List and Plan: 1.  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             -patient may not shower             -ELOS/Goals: 4-6 days/Mod I Appreciate ortho note 10/1             Continue CIR- PT, OT   -Interdisciplinary Team Conference today   2.  Impaired mobility: -DVT/anticoagulation:  Pharmaceutical: Continue Lovenox             -antiplatelet therapy: on ASA  3. Residual limb pain: used Oxycodone, Flexeril and gabapentin TID PTA.-->Dr. [facility])             --has sharp shooting pains in RLE but has had SE with higher doses.   -provided with list of foods for pain control             --willing to try lower dose Lyrica for neuropathy/phantom pain.   -change oxycodone 20mg  to prn for 9-10 pain  -add 15mg  oxycodone prn for 8/10 pain  -encouraged to use Tramadol prn for pain that is 6/10 pain  -patient states she does not request medication if pain is 5/10 or less.              Monitor with increased exertion 4. Mood: LCSW to follow for evaluation and support.              -antipsychotic agents: N/A 5. [name]: This patient is capable of making decisions on her own behalf. 6. Skin/Wound Care: Routine pressure relief measures.  As per ortho Dr [facility], shrinker to applied directly to stump,  may use ABD on outside of shrinker if excess drainage --add santyl to yellow eschar on burn sites on breast and on back.  7. Fluids/Electrolytes/Nutrition: Monitor I/O. Check lytes in am.  8. [name]: Hgb A1c-7.3. Was on metformin 1000 mg bid with  Humulin --Continue Insulin glargline  with novolog for meal coverage. Resume metformin.  Uncontrolled:  --Will monitor BS ac/hs and use SSI for tighter control.  CBG (last 3)  Recent Labs    06/13/21 1632 06/13/21 2058 06/14/21 0550  GLUCAP 127* 191* 167*  Elevated in the evening we will continue to monitor On Semglee 56unit qd and novolog 22U TID  10/4- BG's controlled- continue regimen, provided with list of foods for better blood sugar control 9. Bacteremia: Ampicillin every 4 hours with end date 06/08/21 midnight.  10. HTN: Monitor BP TID--continue Avapro, Imdur and Cardizem.               Vitals:   06/14/21 0431 06/14/21 0534  BP: 136/71   Pulse: 91 92  Resp: 20 18  Temp: 98.2 F (36.8 C)   SpO2: 100% 99%  Controlled, 06/11/2021 11. Connective tissue d/o/Sjogren's: Followed Dr.J. Bravo ([facility])             --continue Plaquenil. Husband to bring eye drops from home.  11. CAD s/p PCI/chronic systolic CHF: Monitor for symptoms with increase in activity.  --On ASA, Imdur and Avapro.  No statin due to NAFL? --monitor for signs of overload.  12. IBS-D: Has been refusing laxatives due to concerns of diarrhea but hard stools reported.              --willing to try one Senna S daily -->monitor for results/SE  10/2- having loose stools- but no Abd cramping/pain or leukocytosis, so won't check C Diff.  13. Asthma: Managed with prn MDI use. Albuterol inhaler added prn.  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 Will check LFTs in am.  15. Acute blood loss anemia: Hgb 9.9 on 9/29, repeat Monday 16. Chronic lymphedema: Continue support stockings. Heart healthy diet.  17. Lower back pain: add kpad and lidocaine patch 18. Morbid obese  62.14: provide dietary education, provided with list of foods for weight loss 19. Residual limb muscle cramps: change flexeril to PRN as has been on this for a long time and is likely tolerant to it, add magnesium gluconate 250mg  HS, r  20.Thrush  10/2- will restart Diflucan 200 mg x1 and then 100 mg daily x 6 days.  21. Polypharmacy: reviewed all medications with patient: discussed plan for oxycodone taper- used 20mg  once today and at least twice yesterday. Continue current dosing   LOS: 6 days A FACE TO FACE EVALUATION WAS PERFORMED  Wally Shevchenko P Nahiara Kretzschmar 06/14/2021, 10:40 AM

## 2021-06-14 NOTE — Progress Notes (Signed)
Patient ID: [name], female   DOB: 03/02/1961, 60 y.o.   MRN: [id number] Team Conference Report to Patient/Family  Team Conference discussion was reviewed with the patient and caregiver, including goals, any changes in plan of care and target discharge date.  Patient and caregiver express understanding and are in agreement.  The patient has a target discharge date of 06/18/21.  SW met with patient, provided team conference updates. Patient reports being very tired and in pain since her fall. Pt needing encouragement for OT session, reports feeling exhausted. No additional questions or concern  Dyanne Iha 06/14/2021, 2:05 PM

## 2021-06-15 ENCOUNTER — Other Ambulatory Visit: Payer: 59

## 2021-06-15 LAB — GLUCOSE, CAPILLARY
Glucose-Capillary: 183 mg/dL — ABNORMAL HIGH (ref 70–99)
Glucose-Capillary: 111 mg/dL — ABNORMAL HIGH (ref 70–99)
Glucose-Capillary: 137 mg/dL — ABNORMAL HIGH (ref 70–99)
Glucose-Capillary: 126 mg/dL — ABNORMAL HIGH (ref 70–99)

## 2021-06-15 MED ORDER — PREGABALIN 25 MG PO CAPS
25.0000 mg | ORAL_CAPSULE | Freq: Every day | ORAL | Status: DC
  Administered 2021-06-15 – 2021-06-23 (×9): 25 mg via ORAL
  Filled 2021-06-15 (×9): qty 1

## 2021-06-15 NOTE — Progress Notes (Signed)
Physical Therapy Session Note  Patient Details  Name: [name] MRN: [id number] Date of Birth: 16-Mar-1961  Today's Date: 06/15/2021 PT Individual Time: 1000-1054 and 1300-1408  PT Individual Time Calculation (min): 54 min and 68 min  Short Term Goals: Week 1:  PT Short Term Goal 1 (Week 1): STG=LTG due to LOS  Skilled Therapeutic Interventions/Progress Updates:   Treatment Session 1 Received pt sitting in [facility] with husband present for family education training. Pt agreeable to PT treatment, and reported pain in low back but did not state pain level. Session with emphasis on discharge planning, functional mobility/transfers, generalized strengthening, simulated car transfers, dynamic standing balance/coordination, and improved activity tolerance. Pt transported to/from room in [facility] total A for time management purposes. Pt's husband reported having 30in high van and therapist, pt, and pt's husband discussed technique for car transfer (suggesting standing and pivoting rather than scooting due to height of van). Also discussed having pt transfer into the car from raised curb when leaving the hospital if needed, to raise height and make it easier to stand. Pt then transferred WC<>car stand<>pivot with RW and min A provided by therapist. Pt with difficulty pivoting on shoe due to grippage and upon sitting on car seat began sliding forward due to difficulty keeping foot on floor due to height. Emphasized importance of having pt's husband in front of her to prevent her from sliding forward and discussed placing small step underneath pt's foot to help her scoot herself back into car seat. Pt able to get LEs into car without assist. Pt then transferred car<>WC stand<>pivot with RW and min A provided by pt's husband - cues to keep RW underneath shoulders for better support. Pt fatigued after activity and required multiple rest breaks throughout session. Pt reported speaking with MD this morning about extending  LOS another week to re-build her confidence and strength. Discussed having pt's husband attend another family education session and practice with a real car transfer next week. Pt performed WC mobility 53ft using BUE and supervision to fatigue and transported remainder of way back to room in [facility] total A. Pt then transferred WC<>bed via lateral scoot with CGA/min A while leaning forward to push off recliner for support. Doffed L shoe and donned non-skid sock. Concluded session with pt sitting EOB, needs within reach, and bed alarm on.   Treatment Session 2  Received pt sitting EOB, pt agreeable to PT treatment, and reported pain 3/10 in low back (premedicated), and feeling very "sleepy". Session with emphasis on functional mobility/transfers, generalized strengthening, dynamic standing balance/coordination, and improved activity tolerance. NT present to take vitals and donned L shoe and limb guard with max A for time management purposes. Pt transferred bed<>WC stand<>pivot with RW and min A "hopping" on LLE. Pt transported to/from room in [facility] total A for energy conservation purposes. Pt performed seated LLE strengthening on Kinetron at 20 cm/sec for 1 minute x 4 trials with therapist providing manual counter resistance with emphasis on quad/glute strengthening. Pt then performed the following exercises sitting in [facility] with supervision and verbal cues for technique: -bicep curls with 9lb dowel 3x10 -WC pushups 2x10 -overhead shoulder press with 2lb dowel 2x10 Pt reported 9/10 fatigue after exercises and required numerous rest breaks throughout session due to fatigue. Returned to room and pt transferred WC<>bed stand<>pivot with RW and min A and doffed L shoe and limb guard with supervision. Concluded session with pt sitting EOB, needs within reach, and bed alarm on. Dietary present to take  dinner order.   Therapy Documentation Precautions:  Precautions Precautions: Fall Precaution Comments: body  habitus Required Braces or Orthoses: Other Brace Other Brace: limb protector Restrictions Weight Bearing Restrictions: Yes RLE Weight Bearing: Non weight bearing  Therapy/Group: Individual Therapy Martin Majestic PT, DPT   06/15/2021, 7:38 AM

## 2021-06-15 NOTE — Progress Notes (Signed)
PROGRESS NOTE   Subjective/Complaints: She had a lot of pain last night Husband at bedside says patient is very sensitive to medication changes Discussed options and she would like to try starting Lyrica at night  ROS: thrush-  Pt denies SOB, abd pain, CP, N/V/C/D, and vision changes, +low back pain- improved, +residual limb pain  Objective:   No results found. Recent Labs    06/13/21 0544  WBC 8.9  HGB 10.2*  HCT 34.4*  PLT 398   Recent Labs    06/13/21 0544  NA 139  K 4.0  CL 101  CO2 27  GLUCOSE 140*  BUN 11  CREATININE 0.94  CALCIUM 9.3    Intake/Output Summary (Last 24 hours) at 06/15/2021 1139 Last data filed at 06/14/2021 1700 Gross per 24 hour  Intake 540 ml  Output --  Net 540 ml        Physical Exam: Vital Signs Blood pressure (!) 167/85, pulse 95, temperature 98.1 F (36.7 C), temperature source Oral, resp. rate 19, height 5\' 2"  (1.575 m), weight (!) 154.1 kg, SpO2 96 %. Gen: no distress, normal appearing, BMI 62.14   General: awake, alert, appropriate, sitting up EOB; NAD HENT: conjugate gaze; oropharynx dry, and has thrush and erythema on sides of mouth as well CV: regular rate; no JVD Pulmonary: CTA B/L; no W/R/R- good air movement GI: soft, NT, ND, (+)BS Neurological: Ox3  Psych: pleasant, normal affect, bright and positive.  Skin:   Neuro: Alert and oriented x3 Musculoskeletal: Right sided residual limb with good range of motion, shrinker in place. 30-45 second standing tolerance Motor: LLE: HF 3--3/5, KE 4+/5, ADF 5/5 RLE: HF, KE 4-/5 (some pain inhibition)    Assessment/Plan: 1. Functional deficits which require 3+ hours per day of interdisciplinary therapy in a comprehensive inpatient rehab setting. Physiatrist is providing close team supervision and 24 hour management of active medical problems listed below. Physiatrist and rehab team continue to assess barriers to  discharge/monitor patient progress toward functional and medical goals  Care Tool:  Bathing    [name]: Right arm, Left arm, Chest, Abdomen, Buttocks, Left upper leg, Right upper leg, Face   [name]: Left lower leg, Front perineal area Body parts n/a: Right lower leg   Bathing assist Assist Level: Minimal Assistance - Patient > 75%     Upper Body Dressing/Undressing Upper body dressing   What is the patient wearing?: Pull over shirt, Bra    Upper body assist Assist Level: Minimal Assistance - Patient > 75%    Lower Body Dressing/Undressing Lower body dressing    Lower body dressing activity did not occur: Refused What is the patient wearing?: Underwear/pull up, Pants     Lower body assist Assist for lower body dressing: Minimal Assistance - Patient > 75%     Toileting Toileting    Toileting assist Assist for toileting: Minimal Assistance - Patient > 75%     Transfers Chair/bed transfer  Transfers assist     Chair/bed transfer assist level: Contact Guard/Touching assist     Locomotion Ambulation   Ambulation assist   Ambulation activity did not occur: Safety/medical concerns (  pain, weakness, decreased balance/postural control)          Walk 10 feet activity   Assist  Walk 10 feet activity did not occur: Safety/medical concerns (pain, weakness, decreased balance/postural control)        Walk 50 feet activity   Assist Walk 50 feet with 2 turns activity did not occur: Safety/medical concerns (pain, weakness, decreased balance/postural control)         Walk 150 feet activity   Assist Walk 150 feet activity did not occur: Safety/medical concerns (pain, weakness, decreased balance/postural control)         Walk 10 feet on uneven surface  activity   Assist Walk 10 feet on uneven surfaces activity did not occur: Safety/medical concerns (pain, weakness, decreased balance/postural control)          Wheelchair     Assist Is the patient using a wheelchair?: Yes Type of Wheelchair: Manual    Wheelchair assist level: Supervision/Verbal cueing Max wheelchair distance: 75ft    Wheelchair 50 feet with 2 turns activity    Assist        Assist Level: Dependent - Patient 0%   Wheelchair 150 feet activity     Assist      Assist Level: Dependent - Patient 0%   Blood pressure (!) 167/85, pulse 95, temperature 98.1 F (36.7 C), temperature source Oral, resp. rate 19, height 5\' 2"  (1.575 m), weight (!) 154.1 kg, SpO2 96 %.  Medical Problem List and Plan: 1.  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             -patient may not shower             -ELOS/Goals: modI 16 days Appreciate ortho note 10/1             Continue CIR- PT, OT   2.  Impaired mobility: -DVT/anticoagulation:  Pharmaceutical: Continue Lovenox             -antiplatelet therapy: on ASA  3. Residual limb pain: used Oxycodone, Flexeril and gabapentin TID PTA.-->Dr. [facility])             --has sharp shooting pains in RLE but has had SE with higher doses.   -provided with list of foods for pain control             --willing to try lower dose Lyrica for neuropathy/phantom pain.   -change oxycodone 20mg  to prn for 9-10 pain  -add 15mg  oxycodone prn for 8/10 pain  -encouraged to use Tramadol prn for pain that is 6/10 pain  -patient states she does not request medication if pain is 5/10 or less.              Monitor with increased exertion  Add Lyrica 25mg  HS 4. Mood: LCSW to follow for evaluation and support.              -antipsychotic agents: N/A 5. [name]: This patient is capable of making decisions on her own behalf. 6. Skin/Wound Care: Routine pressure relief measures.  As per ortho Dr [facility], shrinker to applied directly to stump, may use ABD on outside of shrinker if excess drainage --add santyl to yellow eschar on burn  sites on breast and on back.  7. Fluids/Electrolytes/Nutrition: Monitor I/O. Check lytes in am.  8. [name]: Hgb A1c-7.3. Was on metformin 1000 mg bid with Humulin --Continue Insulin glargline  with novolog for meal coverage.  Resume metformin.  Uncontrolled:  --Will monitor BS ac/hs and use SSI for tighter control.  CBG (last 3)  Recent Labs    06/14/21 1637 06/14/21 2109 06/15/21 0552  GLUCAP 107* 122* 183*  Elevated in the evening we will continue to monitor On Semglee 56unit qd and novolog 22U TID  10/4- BG's controlled- continue regimen, provided with list of foods for better blood sugar control 9. Bacteremia: Ampicillin every 4 hours with end date 06/08/21 midnight.  10. HTN: Monitor BP TID--continue Avapro, Imdur and Cardizem.               Vitals:   06/14/21 1518 06/15/21 0518  BP: (!) 108/56 (!) 167/85  Pulse: 91 95  Resp: 18 19  Temp: 97.7 F (36.5 C) 98.1 F (36.7 C)  SpO2: 97% 96%  Controlled, 06/11/2021 11. Connective tissue d/o/Sjogren's: Followed Dr.J. Bravo ([facility])             --continue Plaquenil. Husband to bring eye drops from home.  11. CAD s/p PCI/chronic systolic CHF: Monitor for symptoms with increase in activity.  --On ASA, Imdur and Avapro.  No statin due to NAFL? --monitor for signs of overload.  12. IBS-D: Has been refusing laxatives due to concerns of diarrhea but hard stools reported.              --willing to try one Senna S daily -->monitor for results/SE  Discussed that zinc can help with wound healing but cause constipation- she defers  10/2- having loose stools- but no Abd cramping/pain or leukocytosis, so won't check C Diff.  13. Asthma: Managed with prn MDI use. Albuterol inhaler added prn.  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 Will check LFTs in am.  15. Acute blood loss anemia: Hgb 9.9 on 9/29, repeat Monday 16. Chronic lymphedema: Continue support stockings. Heart healthy diet.  17. Lower back pain: add kpad and lidocaine  patch 18. Morbid obese 62.14: provide dietary education, provided with list of foods for weight loss 19. Residual limb muscle cramps: change flexeril to PRN as has been on this for a long time and is likely tolerant to it, add magnesium gluconate 250mg  HS, r  20.Thrush  10/2- will restart Diflucan 200 mg x1 and then 100 mg daily x 6 days.  21. Polypharmacy: reviewed all medications with patient: discussed plan for oxycodone taper- used 20mg  once today and at least twice yesterday. Continue current dosing 22. Dry eyes: may use eye drops from home.   LOS: 7 days A FACE TO FACE EVALUATION WAS PERFORMED  12/2 Deshaun Schou 06/15/2021, 11:39 AM

## 2021-06-15 NOTE — Progress Notes (Signed)
Occupational Therapy Session Note  Patient Details  Name: [name] MRN: [id number] Date of Birth: 06-18-1961  Today's Date: 06/15/2021 OT Individual Time: 7341-9379 OT Individual Time Calculation (min): 60 min    Short Term Goals: Week 1:  OT Short Term Goal 1 (Week 1): Patient will complete toilet transfer with consistent min A OT Short Term Goal 2 (Week 1): Patient will complete 1 step of toileting task OT Short Term Goal 3 (Week 1): Patient will perform sit<>stand with min A on first trial  Skilled Therapeutic Interventions/Progress Updates:  Skilled OT intervention completed with focus on family education with pt's husband present throughout session, ADL retraining and discharge planning and recommendations. Pt received seated EOB, set up for bathing and agreeable to therapy session. While seated EOB, pt completed UB bathing/dressing with min A for bra management, and LB bathing including top of both upper thighs with supervision. Therapist removed residual limb ace wrap and bandaging at total assist, checked with RN about donning non-adhesive dressing, then black sock, with ace wrap on top with MD visiting to look at residual limb prior to donning coverings. Pt was able to recall unprompted, that the sock should be donned prior to dressings per surgeons recommendations. Education and recommendations included, pt wearing a shoe for transfers vs hospital sock even at home, as well as a discussion about pt's PLOF with bathing in pt's home bathroom. Pt advised not to lateral hop while holding onto sink/toilet in pt's bathroom, as pt described, due to safety concerns with pt's inability to fit RW or w/c in pt's usual bathroom especially with pt's CLOF during therapy. With limb guard donned, pt completed sit > stand with RW with min A x2 for safety and pt's reassurance due to fearfulness, with therapist donning waist band of shorts. Using RW pt stand pivot while hopping on LLE to w/c with min  A-CGA x 2 for pt's comfort level. Pt left seated in w/c at sink, set up for grooming tasks with husband in room and awaiting next therapy session. All needs in reach at end of session.  Therapy Documentation Precautions:  Precautions Precautions: Fall Precaution Comments: body habitus Required Braces or Orthoses: Other Brace Other Brace: limb protector Restrictions Weight Bearing Restrictions: Yes RLE Weight Bearing: Non weight bearing  Pain: Pt with no complaints of pain today during session  Therapy/Group: Individual Therapy  Abb Gobert E Seon Gaertner 06/15/2021, 10:11 AM

## 2021-06-16 ENCOUNTER — Encounter (HOSPITAL_BASED_OUTPATIENT_CLINIC_OR_DEPARTMENT_OTHER): Payer: 59 | Admitting: Internal Medicine

## 2021-06-16 LAB — GLUCOSE, CAPILLARY
Glucose-Capillary: 182 mg/dL — ABNORMAL HIGH (ref 70–99)
Glucose-Capillary: 136 mg/dL — ABNORMAL HIGH (ref 70–99)
Glucose-Capillary: 95 mg/dL (ref 70–99)
Glucose-Capillary: 176 mg/dL — ABNORMAL HIGH (ref 70–99)

## 2021-06-16 NOTE — Progress Notes (Signed)
Showed spouse how to do the dressing on pt back and to left breast.
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
## 2021-06-16 NOTE — Progress Notes (Signed)
Patient ID: [name], female   DOB: [birth year], 60 y.o.   MRN: [id number]  [facility] agency list provided to patient.  [place], [place] [phone]

## 2021-06-16 NOTE — Progress Notes (Signed)
Physical Therapy Session Note  Patient Details  Name: [name] MRN: [id number] Date of Birth: 1961/05/16  Today's Date: 06/16/2021 PT Individual Time: 0347-4259 PT Individual Time Calculation (min): 61 min   Short Term Goals: Week 1:  PT Short Term Goal 1 (Week 1): STG=LTG due to LOS  Skilled Therapeutic Interventions/Progress Updates:  Pt received seated EOB w/NT finishing toileting. Pt reported 7/10 pain in R residual limb, was premedicated. Pt performed lower body dressing w/set-up assist and upper body dressing w/min A at EOB. Donned compression sock w/total A and pt donned limb guard w/mod A. Sit <>stand from EOB to RW w/CGA and completed pulling pants up w/min A. Stand pivot to [facility] [place] [facility] w/CGA and pt transported to day gym w/total A for time management. Sit <>stand pivot from WC to mat w/CGA and RW.   On mat for core stability and strengthening, S* and frequent rest breaks throughout: -[place] twists w/6# med ball, 4x10 per side without LE support  -Lumberjack twists w/6# med ball, 2x10 per side without LE support   Sit <>stand pivot from EOM to [facility] w/RW and CGA. Pt transported back to room w/total A and was left seated in WC in room, all needs in reach.   Therapy Documentation Precautions:  Precautions Precautions: Fall Precaution Comments: body habitus Required Braces or Orthoses: Other Brace Other Brace: limb protector Restrictions Weight Bearing Restrictions: Yes RLE Weight Bearing: Non weight bearing   Therapy/Group: Individual Therapy Jill Alexanders Reeya Bound, PT, DPT  06/16/2021, 10:03 AM
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
## 2021-06-16 NOTE — Progress Notes (Signed)
Physical Therapy Weekly Progress Note  Patient Details  Name: [name] MRN: [id number] Date of Birth: 11/06/1960  Beginning of progress report period: June 09, 2021 End of progress report period: June 16, 2021  Today's Date: 06/16/2021 PT Individual Time: 6256-3893 PT Individual Time Calculation (min): 70 min   Patient has met 0 of 7 long term goals. Pt's LOS was extended due to generalized weakness and fear/loss of confidence associated from fall on 10/3 while transferring into WC. Pt is currently able to perform bed mobility with supervision, lateral scoot transfers with CGA, stand<>pivot transfers with bariatric RW and CGA/min A, and WC mobility up to 44ft with supervision. Pt continues to be limited by pain, generalized weakness, body habitus, decreased balance/postural control, and fear of falling. Pt's husband attended family education on 10/5, however would benefit from further family education sessions prior to discharge. Plan to practice real car transfer with pt and husband on 10/11.   Patient continues to demonstrate the following deficits muscle weakness, decreased cardiorespiratoy endurance, and decreased standing balance, decreased postural control, decreased balance strategies, and difficulty maintaining precautions and therefore will continue to benefit from skilled PT intervention to increase functional independence with mobility.  Patient progressing toward long term goals..  Continue plan of care.  PT Short Term Goals Week 1:  PT Short Term Goal 1 (Week 1): STG=LTG due to LOS Week 2:  PT Short Term Goal 1 (Week 2): STG=LTG due to extended LOS  Skilled Therapeutic Interventions/Progress Updates:  Ambulation/gait training;Discharge planning;Functional mobility training;Psychosocial support;Therapeutic Activities;Balance/vestibular training;Disease management/prevention;Neuromuscular re-education;Skin care/wound management;Therapeutic Exercise;Wheelchair  propulsion/positioning;DME/adaptive equipment instruction;Pain management;Splinting/orthotics;UE/LE Strength taining/ROM;Community reintegration;Patient/family education;Stair training;UE/LE Coordination activities   Today's Interventions: Received pt sitting EOB, pt agreeable to PT treatment, and denied any pain but reported cramping in R thigh; applied muscle cramp relief foam. Session with emphasis on functional mobility/transfers, generalized strengthening, dynamic standing balance/coordination, simulated car transfers, and improved activity tolerance. Therapist inspected pt's personal WC and flipped around anti-tippers. Noted pt's L armrest stuck and unable to flip back; need to assess further. Donned R limb guard with max A and pt transferred bed<>WC stand<>pivot with RW and CGA and transported to/from room in [facility] total A for time management and energy conservation purposes. Pt performed simulated car transfer with bariatric RW and CGA with increased time. Pt required cues to get RW underneath shoulders for stability and to take her time once standing up prior to transferring to ensure she has her balance/strength. Pt reported feeling much better about the car transfer after practicing and is more confident about practicing with the actual Lucianne Lei next week. Pt then performed 2x15 hip flexion bilaterally with 1.5lb ankle weight on RLE with emphasis on hip flexor strengthening. Pt transferred WC<>bed stand<>pivot with RW and CGA. Concluded session with pt sitting EOB with all needs within reach.   Therapy Documentation Precautions:  Precautions Precautions: Fall Precaution Comments: body habitus Required Braces or Orthoses: Other Brace Other Brace: limb protector Restrictions Weight Bearing Restrictions: Yes RLE Weight Bearing: Non weight bearing  Therapy/Group: Individual Therapy Alfonse Alpers PT, DPT  06/16/2021, 7:31 AM

## 2021-06-17 ENCOUNTER — Encounter (HOSPITAL_BASED_OUTPATIENT_CLINIC_OR_DEPARTMENT_OTHER): Payer: 59 | Admitting: Internal Medicine

## 2021-06-17 LAB — GLUCOSE, CAPILLARY
Glucose-Capillary: 156 mg/dL — ABNORMAL HIGH (ref 70–99)
Glucose-Capillary: 136 mg/dL — ABNORMAL HIGH (ref 70–99)
Glucose-Capillary: 133 mg/dL — ABNORMAL HIGH (ref 70–99)
Glucose-Capillary: 90 mg/dL (ref 70–99)

## 2021-06-17 MED ORDER — DICLOFENAC SODIUM 1 % EX GEL
2.0000 g | Freq: Four times a day (QID) | CUTANEOUS | Status: DC
  Administered 2021-06-17 – 2021-06-24 (×27): 2 g via TOPICAL
  Filled 2021-06-17: qty 100

## 2021-06-17 NOTE — Progress Notes (Signed)
Physical Therapy Session Note  Patient Details  Name: [name] MRN: [id number] Date of Birth: 03-26-1961  Today's Date: 06/17/2021 PT Individual Time: 5681-2751 PT Individual Time Calculation (min): 78 min  PT Missed Time: 12 minutes PT Missed Time Reason: Fatigue  Short Term Goals: Week 1:  PT Short Term Goal 1 (Week 1): STG=LTG due to LOS Week 2:  PT Short Term Goal 1 (Week 2): STG=LTG due to extended LOS  Skilled Therapeutic Interventions/Progress Updates:   Received pt sitting EOB with RN present administering medications, pt agreeable to PT treatment, and reported pain 8/10 in low back (premedicated). Repositioning, rest breaks, and distraction done to reduce pain levels. Session with emphasis on functional mobility/transfers, generalized strengthening, dynamic standing balance/coordination, and improved activity tolerance. Donned L compression sock and shoe with max/total A and MD arrived for morning rounds. Donned R residual limb guard with max A and transferred bed<>WC stand<>pivot with RW and CGA but required 2 attempts and min A to stand from bed. Pt performed WC mobility 106ft using BUE and supervision but stopped due to fatigue and discomfort in bilateral elbows. Pt transported remainder of way to main therapy gym in [facility] total A for energy conservation purposes. Pt performed x 3 additional stand<>pivot transfers with RW and CGA throughout session.  Pt transferred sit<>semi-reclined on wedge with supervision but had difficulty breathing, returned to sitting EOM with min A. Placed 3in step under LLE and pt performed trunk rotation with 4.4lb medicine ball 2x10 bilaterally. Pt requested to use inhaler, reached out to MD and MD cleared use. Sit<>semi-reclined with supervision on higher wedge and performed the following exercises with supervision and verbal cues for technique: -SLR on RLE 2x10 -hip abduction on RLE 2x10  -heel slides AAROM 2x15 on LLE  Pt required multiple extended  rest breaks throughout session due to fatigue/SOB and reported 8/10 fatigue after exercises. Semi-reclined<>sitting EOM for second time with light min A and performed seated hamstring curls with grn TB 1x12 and 1x5 to fatigue on LLE and 1x8 hip abduction with grn TB with emphasis on hip abductor strengthening. Pt transported back to room in [facility] total A and requested to return to bed. Concluded session with pt sitting EOB with all needs within reach. Provided pt with fresh drink. 12 minutes missed of skilled physical therapy due to fatigue.   Therapy Documentation Precautions:  Precautions Precautions: Fall Precaution Comments: body habitus Required Braces or Orthoses: Other Brace Other Brace: limb protector Restrictions Weight Bearing Restrictions: Yes RLE Weight Bearing: Non weight bearing  Therapy/Group: Individual Therapy Martin Majestic PT, DPT   06/17/2021, 7:19 AM

## 2021-06-17 NOTE — Plan of Care (Signed)
  Problem: Consults Goal: Skin Care Protocol Initiated - if Braden Score 18 or less Description: If consults are not indicated, leave blank or document N/A Outcome: Progressing Goal: Diabetes Guidelines if Diabetic/Glucose > 140 Description: If diabetic or lab glucose is > 140 mg/dl - Initiate Diabetes/Hyperglycemia Guidelines & Document Interventions  Outcome: Progressing   Problem: RH SKIN INTEGRITY Goal: RH STG MAINTAIN SKIN INTEGRITY WITH ASSISTANCE Description: STG Maintain Skin Integrity With Supervision Assistance. Outcome: Progressing Goal: RH STG ABLE TO PERFORM INCISION/WOUND CARE W/ASSISTANCE Description: STG Able To Perform Incision/Wound Care With Supervision Assistance. Outcome: Progressing   Problem: RH SAFETY Goal: RH STG ADHERE TO SAFETY PRECAUTIONS W/ASSISTANCE/DEVICE Description: STG Adhere to Safety Precautions With Supervision Assistance/Device. Outcome: Progressing Goal: RH STG DECREASED RISK OF FALL WITH ASSISTANCE Description: STG Decreased Risk of Fall With Supervision Assistance. Outcome: Progressing   Problem: RH PAIN MANAGEMENT Goal: RH STG PAIN MANAGED AT OR BELOW PT'S PAIN GOAL Description: < 3 on a 0-10 pain scale. Outcome: Progressing   Problem: RH KNOWLEDGE DEFICIT LIMB LOSS Goal: RH STG INCREASE KNOWLEDGE OF SELF CARE AFTER LIMB LOSS Description: Patient will demonstrate knowledge of medication management, pain management, skin/wound care, and residual limb care with educational materials and handouts provided by staff independently at discharge. Outcome: Progressing
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
## 2021-06-17 NOTE — Progress Notes (Signed)
Occupational Therapy Weekly Progress Note  Patient Details  Name: [name] MRN: [id number] Date of Birth: 1961-07-13  Beginning of progress report period: June 09, 2021 End of progress report period: June 17, 2021  Today's Date: 06/17/2021 OT Individual Time: 5681-2751 OT Individual Time Calculation (min): 60 min    Patient has met 3 of 3 short term goals.  Pt is making steady progress towards goals.  Pt is completing sit > stand and stand pivot transfer with min A - CGA with RW.  Pt benefits from use of bariatric RW for stability due to body habitus and standing on single leg.  Pt continues to require physical assistance for clothing management with toileting at sit > stand level, is able to complete LB dressing from EOB and supine at bed level with increased time.  Pt did have fall during PT 10/3 due to LLE instability.  Pt continues to be fearful of falling and requires encouragement for standing and transfers.  Pt's husband has been present for education and may benefit from additional education session prior to d/c home.  Patient continues to demonstrate the following deficits: muscle weakness, decreased cardiorespiratoy endurance, and decreased sitting balance, decreased standing balance, decreased postural control, decreased balance strategies, and functional balance s/p BKA and therefore will continue to benefit from skilled OT intervention to enhance overall performance with BADL and Reduce care partner burden.  Patient progressing toward long term goals..  Continue plan of care.  OT Short Term Goals Week 1:  OT Short Term Goal 1 (Week 1): Patient will complete toilet transfer with consistent min A OT Short Term Goal 1 - Progress (Week 1): Met OT Short Term Goal 2 (Week 1): Patient will complete 1 step of toileting task OT Short Term Goal 2 - Progress (Week 1): Met OT Short Term Goal 3 (Week 1): Patient will perform sit<>stand with min A on first trial OT Short Term  Goal 3 - Progress (Week 1): Met Week 2:  OT Short Term Goal 1 (Week 2): STG = LTGs due to remaining LOS  Skilled Therapeutic Interventions/Progress Updates:    Treatment session with focus on sit > stand, functional transfer, dynamic standing balance, and amputee support and education.  Pt received reclined in bed reporting having taken a nap.  Pt agreeable to therapy session, requesting to go outside during session.  Pt completed bed mobility CGA.  Pt required increased time when attempting to don limb guard, however requiring assist to don this session.  Pt completed sit > stand min assist and stand pivot transfer to [facility] with RW with CGA.  Pt required assistance with clothing management pre and post toileting, however able to utilize toilet aid to complete hygiene. Pt transported outside via w/c.  Engaged in discussion regarding recommendation for HHOT to focus on functional mobility in home and bathroom accessibility.  Pt convinced that she can access bathroom and "hop" the few feet to toilet and tub.  Therapist continues to recommend [place] to work on mobility in home. Pt asking questions about transporting items in kitchen, educated on not walking yet with RW and plan to utilize w/c for mobility in home until cleared to ambulate more.  Discussed strain on intact limb with "hopping" with pt reporting understanding.  Pt reports peer phone call went well, but would still like a female peer support.  Therapist provided contact information for amputee support group leader.  Pt remained upright in recliner with all needs in reach.  Therapy Documentation Precautions:  Precautions Precautions: Fall Precaution Comments: body habitus Required Braces or Orthoses: Other Brace Other Brace: limb protector Restrictions Weight Bearing Restrictions: Yes RLE Weight Bearing: Non weight bearing Pain:  Pt with no c/o pain   Therapy/Group: Individual Therapy  Simonne Come 06/17/2021, 3:24 PM

## 2021-06-17 NOTE — Progress Notes (Signed)
Occupational Therapy Session Note  Patient Details  Name: [name] MRN: [id number] Date of Birth: [birth year]  Today's Date: 06/17/2021 OT Individual Time: 0801-0901 OT Individual Time Calculation (min): 60 min    Short Term Goals: Week 1:  OT Short Term Goal 1 (Week 1): Patient will complete toilet transfer with consistent min A OT Short Term Goal 2 (Week 1): Patient will complete 1 step of toileting task OT Short Term Goal 3 (Week 1): Patient will perform sit<>stand with min A on first trial  Skilled Therapeutic Interventions/Progress Updates:  Skilled OT intervention completed with focus on ADL retraining. Pt received seated EOB, requesting and agreeable to complete bathing during session. Pt with request to use dry shampoo at bedside, stating "my arms are extra tired today, it must be my fibromyalgia," demonstrating some distractibility at times throughout session. UB bathing and dressing completed with set up, LB bathing with supervision and mod A for LB dressing with pt completing sit <> stands with CGA and RW to donn underwear/pants over hips. Note, pt requested to donn leggings, which are tighter than what pt has been putting on so pt with increased difficulty this session to pull up with weight shifting while seated EOB, however pt able to donn to thighs, then therapist completing the rest with pt in standing with RW and intermittent breaks throughout. Pt unable to assist while in standing at RW due to fear and lateral lean. Noticed small skin wound on L lower shin, with active blood from pt scratching during bathing, 2 band aids applied.Therapist donned gripper sock with total A due to time. Pt left seated EOB with bed alarm activated, call bell and all other needs in reach at end of session.  Therapy Documentation Precautions:  Precautions Precautions: Fall Precaution Comments: body habitus Required Braces or Orthoses: Other Brace Other Brace: limb  protector Restrictions Weight Bearing Restrictions: Yes RLE Weight Bearing: Non weight bearing  Pain: Pt reported feelings of fatigue due to "fibromyalgia" but no complaints of pain during OT session.   Therapy/Group: Individual Therapy  Kerie Badger E Farrell Broerman 06/17/2021, 8:26 AM

## 2021-06-18 DIAGNOSIS — E1169 Type 2 diabetes mellitus with other specified complication: Secondary | ICD-10-CM

## 2021-06-18 DIAGNOSIS — E669 Obesity, unspecified: Secondary | ICD-10-CM

## 2021-06-18 DIAGNOSIS — G8918 Other acute postprocedural pain: Secondary | ICD-10-CM

## 2021-06-18 DIAGNOSIS — I739 Peripheral vascular disease, unspecified: Secondary | ICD-10-CM

## 2021-06-18 LAB — GLUCOSE, CAPILLARY
Glucose-Capillary: 146 mg/dL — ABNORMAL HIGH (ref 70–99)
Glucose-Capillary: 152 mg/dL — ABNORMAL HIGH (ref 70–99)
Glucose-Capillary: 103 mg/dL — ABNORMAL HIGH (ref 70–99)
Glucose-Capillary: 126 mg/dL — ABNORMAL HIGH (ref 70–99)

## 2021-06-18 MED ORDER — TIZANIDINE HCL 2 MG PO TABS
2.0000 mg | ORAL_TABLET | Freq: Three times a day (TID) | ORAL | Status: DC
  Administered 2021-06-18 – 2021-06-24 (×19): 2 mg via ORAL
  Filled 2021-06-18 (×17): qty 1
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
## 2021-06-19 LAB — GLUCOSE, CAPILLARY
Glucose-Capillary: 128 mg/dL — ABNORMAL HIGH (ref 70–99)
Glucose-Capillary: 154 mg/dL — ABNORMAL HIGH (ref 70–99)
Glucose-Capillary: 126 mg/dL — ABNORMAL HIGH (ref 70–99)
Glucose-Capillary: 144 mg/dL — ABNORMAL HIGH (ref 70–99)
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
## 2021-06-20 ENCOUNTER — Encounter (HOSPITAL_BASED_OUTPATIENT_CLINIC_OR_DEPARTMENT_OTHER): Payer: 59 | Admitting: Internal Medicine

## 2021-06-20 LAB — BASIC METABOLIC PANEL
Anion gap: 12 (ref 5–15)
BUN: 15 mg/dL (ref 6–20)
CO2: 26 mmol/L (ref 22–32)
Calcium: 9.5 mg/dL (ref 8.9–10.3)
Chloride: 97 mmol/L — ABNORMAL LOW (ref 98–111)
Creatinine, Ser: 0.93 mg/dL (ref 0.44–1.00)
GFR, Estimated: >60 mL/min (ref >60)
Glucose, Bld: 142 mg/dL — ABNORMAL HIGH (ref 70–99)
Potassium: 4.3 mmol/L (ref 3.5–5.1)
Sodium: 135 mmol/L (ref 135–145)

## 2021-06-20 LAB — CBC
HCT: 38.3 % (ref 36.0–46.0)
Hemoglobin: 11.6 g/dL — ABNORMAL LOW (ref 12.0–15.0)
MCH: 25.2 pg — ABNORMAL LOW (ref 26.0–34.0)
MCHC: 30.3 g/dL (ref 30.0–36.0)
MCV: 83.1 fL (ref 80.0–100.0)
Platelets: 405 10*3/uL — ABNORMAL HIGH (ref 150–400)
RBC: 4.61 MIL/uL (ref 3.87–5.11)
RDW: 16.5 % — ABNORMAL HIGH (ref 11.5–15.5)
WBC: 8.6 10*3/uL (ref 4.0–10.5)
nRBC: 0 % (ref 0.0–0.2)

## 2021-06-20 LAB — GLUCOSE, CAPILLARY
Glucose-Capillary: 136 mg/dL — ABNORMAL HIGH (ref 70–99)
Glucose-Capillary: 157 mg/dL — ABNORMAL HIGH (ref 70–99)
Glucose-Capillary: 80 mg/dL (ref 70–99)
Glucose-Capillary: 70 mg/dL (ref 70–99)
Glucose-Capillary: 143 mg/dL — ABNORMAL HIGH (ref 70–99)

## 2021-06-20 MED ORDER — LIDOCAINE 5 % EX PTCH
1.0000 | MEDICATED_PATCH | Freq: Two times a day (BID) | CUTANEOUS | Status: DC
  Administered 2021-06-20 – 2021-06-24 (×9): 1 via TRANSDERMAL
  Filled 2021-06-20 (×8): qty 1

## 2021-06-20 NOTE — Progress Notes (Signed)
Patient ID: [name], female   DOB: Aug 14, 1961, 60 y.o.   MRN: [id number]  Pt [facility] referral sent to [facility] [place], [place] [phone]

## 2021-06-20 NOTE — Progress Notes (Signed)
Patient ID: [name], female   DOB: [birth year], 60 y.o.   MRN: [id number]  Pt [facility] referral sent to [facility] [place], [place] [phone]

## 2021-06-20 NOTE — Progress Notes (Signed)
Occupational Therapy Session Note  Patient Details  Name: [name] MRN: [id number] Date of Birth: 1961-02-27  Today's Date: 06/20/2021 OT Individual Time: 1304-1402 OT Individual Time Calculation (min): 58 min    Short Term Goals: Week 2:  OT Short Term Goal 1 (Week 2): STG = LTGs due to remaining LOS  Skilled Therapeutic Interventions/Progress Updates:  Skilled OT intervention completed with focus on LB clothing management including donning/doffing limb guard and donning/doffing shorts at sit > stand level during toileting. Pt received seated EOB with nurse present upon arrival. Pt agreeable to therapy session, reporting she felt better after her breathing treatment and eating a "nausea friendly" lunch. Pt requested practice with doffing velcro straps of limb guard while seated and residual limb supported, with pt complaining of not being able to grasp the strap due to low visibility with the angle it's at and decreased feeling in her fingers. Pt donned limb guard with set up assist, with therapist providing solution and demonstration of technique of creating a small fold/tab at the end of the velcro strap to help indicate to the pt where to pull strap. Pt return demonstrated creating the "tab" (as pt called it), and was then able to doff without assist. Pt requesting to void, re-donned limb guard, completed sit > stand with CGA, then stand pivot/hop using RW and CGA. Pt encouraged to doff shorts waistline by releasing one hand from walker, with pt requiring CGA-min A for balance, however pt able to doff shorts to knees. Set up for wiping, with pt using toileting stick. Pt used lateral weight shift to donn shorts to hips, then sit > stand from [facility] using RW and CGA, with pt attempting to donn shorts by releasing one hand and min A for balance to pull up shorts with pt able to get back part of shorts over bottom, but unable to donn the front with expressed weakness and difficulty. BSC > EOB using  RW and stand pivot, with CGA, pt was then able to donn front waist band while seated EOB using a leaning back technique with SBA. Pt opted to stay seated EOB at end of session, with residual limb supported on recliner, bed alarm activated, and all needs in reach at therapist departure.   Therapy Documentation Precautions:  Precautions Precautions: Fall Precaution Comments: body habitus Required Braces or Orthoses: Other Brace Other Brace: limb protector Restrictions Weight Bearing Restrictions: Yes RLE Weight Bearing: Non weight bearing  Pain: Pt c/o 6/10 back pain with nurse present, aware. Meds to be provided upon therapist departure.  Therapy/Group: Individual Therapy  Keonte Daubenspeck E Patrisia Faeth 06/20/2021, 2:12 PM

## 2021-06-20 NOTE — Progress Notes (Signed)
Patient ID: [name], female   DOB: 1960/10/23, 60 y.o.   MRN: [id number]  Pt accepted by [facility]  [place], [place] [phone]

## 2021-06-20 NOTE — Plan of Care (Signed)
  Problem: RH Ambulation Goal: LTG Patient will ambulate in controlled environment (PT) Description: LTG: Patient will ambulate in a controlled environment, # of feet with assistance (PT). Outcome: Not Applicable Flowsheets (Taken 06/20/2021 0743) LTG: Pt will ambulate in controlled environ  assist needed:: (D/C) -- Note: D/C Goal: LTG Patient will ambulate in home environment (PT) Description: LTG: Patient will ambulate in home environment, # of feet with assistance (PT). Outcome: Not Applicable Flowsheets (Taken 06/20/2021 0743) LTG: Pt will ambulate in home environ  assist needed:: (D/C) -- Note: D/C   Problem: RH Wheelchair Mobility Goal: LTG Patient will propel w/c in controlled environment (PT) Description: LTG: Patient will propel wheelchair in controlled environment, # of feet with assist (PT) Flowsheets (Taken 06/20/2021 0743) LTG: Pt will propel w/c in controlled environ  assist needed:: (downgraded due to fatigue, weakness, deconditioning) Supervision/Verbal cueing LTG: Propel w/c distance in controlled environment: 36ft Note: downgraded due to fatigue, weakness, deconditioning Goal: LTG Patient will propel w/c in home environment (PT) Description: LTG: Patient will propel wheelchair in home environment, # of feet with assistance (PT). Flowsheets (Taken 06/20/2021 0743) LTG: Pt will propel w/c in home environ  assist needed:: (downgraded due to fatigue, weakness, deconditioning) Supervision/Verbal cueing LTG: Propel w/c distance in home environment: 11ft Note: downgraded due to fatigue, weakness, deconditioning

## 2021-06-20 NOTE — Progress Notes (Signed)
Occupational Therapy Session Note  Patient Details  Name: [name] MRN: [id number] Date of Birth: 27-Apr-1961  Today's Date: 06/20/2021 OT Individual Time: 0801-0903 OT Individual Time Calculation (min): 62 min    Short Term Goals: Week 2:  OT Short Term Goal 1 (Week 2): STG = LTGs due to remaining LOS  Skilled Therapeutic Interventions/Progress Updates:  Skilled OT intervention completed with focus on ADL retraining and functional mobility. Pt received seated EOB applying makeup to self, with RN at bedside administering morning meds. Pt agreeable to therapy session. Pt completed EOB UB bathing/dressing with set up, LB bathing with supervision and LB dressing with max assist with assistance needed this session to thread shorts with pt utilizing reacher, and to donn shorts over waist line at the sit>stand level using RW and CGA. Pt demonstrated several rounds of coughing with pt mentioning she had a breathing treatment last night which helped her asthma and requested that she have another one due to feeling SOB during ADLs. Donned limb guard with total assist. Pt completed stand pivot hopping from bed > w/c using RW and CGA. Pt completed grooming tasks while seated in w/c at sink with set up assist. Following pt self use of nelli for nasal rinse, pt reported feeling nauseous due to "fluid going in her ear." Pt requested basin to vomit with pt dry heaving several rounds, and therapist calling for RN. Pt reported feeling a little better after dry heaving, with pt left seated in w/c at bedside, with basin in case of another episode, with call bell and other needs in reach at end of session. Nurse notified of pt's status and request of breathing treatment/nausea meds.  Therapy Documentation Precautions:  Precautions Precautions: Fall Precaution Comments: body habitus Required Braces or Orthoses: Other Brace Other Brace: limb protector Restrictions Weight Bearing Restrictions: Yes RLE Weight  Bearing: Non weight bearing  Pain: Pt reported that pre-medicated at 4 am her back pain was 9/10, however has since improved to no pain (medicated). Pt with episode of vomiting during session, nursing made aware.   Therapy/Group: Individual Therapy  Ishaan Villamar E Aliahna Statzer 06/20/2021, 8:19 AM
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
## 2021-06-20 NOTE — Progress Notes (Signed)
Physical Therapy Session Note  Patient Details  Name: [name] MRN: [id number] Date of Birth: 08-18-1961  Today's Date: 06/20/2021 PT Individual Time: 0915-1025 PT Individual Time Calculation (min): 70 min   Short Term Goals: Week 1:  PT Short Term Goal 1 (Week 1): STG=LTG due to LOS Week 2:  PT Short Term Goal 1 (Week 2): STG=LTG due to extended LOS  Skilled Therapeutic Interventions/Progress Updates:   Received pt sitting in WC receiving breathing treatment, pt agreeable to PT treatment, and did not state pain level but reported receiving pain medication this morning. Pt reported having a "rough" 24 hours and feeling slightly SOB and getting sick this morning when using her Neti pot. Session with emphasis on functional mobility/transfers, generalized strengthening, dynamic standing balance/coordination, simulated car transfers, and improved activity tolerance. Pt with questions regarding ordering amputee support pad; reached out to CSW for further pricing information. Donned compression sock with total A and pt transported to/from room in [facility] total A for time management purposes. Donned L shoe using shoe horn and max A. In ortho gym, pt performed BUE strengthening on UBE at level 3.5 for 1.5 minutes forward and 1.5 minutes backwards for 4 minutes with 2 rest breaks due to fatigue. Pt then performed simulated car transfer using bariatric RW and CGA in preparation for real car transfer with husband tomorrow. Pt transported back to room in [facility] total A and transferred WC<>bed stand<>pivot with CGA. Concluded session with pt sitting EOB with all needs within reach and MD present at bedside for morning rounds. Provided pt with fresh ice.   Therapy Documentation Precautions:  Precautions Precautions: Fall Precaution Comments: body habitus Required Braces or Orthoses: Other Brace Other Brace: limb protector Restrictions Weight Bearing Restrictions: Yes RLE Weight Bearing: Non weight  bearing  Therapy/Group: Individual Therapy Martin Majestic PT, DPT   06/20/2021, 7:23 AM

## 2021-06-21 ENCOUNTER — Telehealth: Payer: Self-pay | Admitting: Cardiology

## 2021-06-21 LAB — URINALYSIS, ROUTINE W REFLEX MICROSCOPIC
Bilirubin Urine: NEGATIVE
Glucose, UA: NEGATIVE mg/dL
Hgb urine dipstick: NEGATIVE
Ketones, ur: NEGATIVE mg/dL
Nitrite: NEGATIVE
Protein, ur: NEGATIVE mg/dL
Specific Gravity, Urine: 1.005 (ref 1.005–1.030)
WBC, UA: >50 WBC/hpf — ABNORMAL HIGH (ref 0–5)
pH: 5 (ref 5.0–8.0)

## 2021-06-21 LAB — GLUCOSE, CAPILLARY
Glucose-Capillary: 153 mg/dL — ABNORMAL HIGH (ref 70–99)
Glucose-Capillary: 132 mg/dL — ABNORMAL HIGH (ref 70–99)
Glucose-Capillary: 97 mg/dL (ref 70–99)
Glucose-Capillary: 203 mg/dL — ABNORMAL HIGH (ref 70–99)

## 2021-06-21 NOTE — Progress Notes (Signed)
Physical Therapy Session Note  Patient Details  Name: [name] MRN: [id number] Date of Birth: 06-04-1961  Today's Date: 06/21/2021 PT Individual Time: 2694-8546 and 1400-1526  PT Individual Time Calculation (min): 54 min and 86 min  Short Term Goals: Week 1:  PT Short Term Goal 1 (Week 1): STG=LTG due to LOS Week 2:  PT Short Term Goal 1 (Week 2): STG=LTG due to extended LOS  Skilled Therapeutic Interventions/Progress Updates:   Treatment Session 1 Received pt sitting EOB fatigued from taking shower with OT, pt agreeable to PT treatment, and reported pain in R residual limb (unrated). Pt requested to go over questions she has and then work on exercises this afternoon after performing car transfer due to fatigue from shower. Session with emphasis on discharge planning, home setup/equipment, and improved activity tolerance. Donned pull over shirt with set up assist. Sit<>stand with bariatric RW and supervision and required total A to pull pants over hips. Pt pulled up bariatric RW online and therapist provided recommendations for appropriate weight/size and pt ordered RW. Discussed purchasing bed assist rail to place underneath mattress to allow pt to be more independent with bed mobility and allow her to sleep in her regular bed with her husband. Pt reported trying to find an amputee support pad but due to financial reasons and manufacturing backorder, plans to have her husband build temporary amputee support pad. RN present to administer medications and dress wounds. Pt reported potentially being ready to discharge tomorrow if her equipment comes in time; therefore performed strength and sensation assessments. Donned compression sock and non-skid sock with total A and increased time. Measured pt's WC and compared to current WC per pt request; pt's WC 20x18 and current one 24x18 but pt reports that her personal WC is comfortable and doesn't rub against her hips. Discussed plan for practicing car  transfer this afternoon and provided pt with [facility] address to give to her husband to meet therapist and pt this afternoon. Concluded session with pt sitting EOB with all needs within reach.   Treatment Session 2 Received pt sitting EOB with limb guard on, prepared for family education with husband with emphasis on car transfer training. Pt agreeable to PT treatment and reported pain 5/10 in low back and R residual limb (premedicated). Session with emphasis on discharge planning, functional mobility/transfers, generalized strengthening, dynamic standing balance/coordination, car transfers, toileting, and improved activity tolerance. Donned L shoe with max A and transferred bed<>WC stand<>pivot with bariatric RW and CGA. Pt transported outside to entrance of WCC in WC total A for energy conservation purposes. Pt performed real car transfer from 30in high van stand<>pivot using bariatric RW. Pt entered with therapist providing CGA and exited with husband providing CGA. Upon backing up to car seat, pt with leg cramp and began to panic - required cues for deep breathing and focus to task at hand. Educated pt's husband on body mechanics/positioning and placement of equipment with transfer. Pt visibly fatigued after transfer and declined practicing again but verbalized confidence with task. Pt transported to dayroom in [facility] total A and performed LLE strengthening on Kinetron at 20 cm/sec for 1 minute x 4 trials with therapist providing manual counter resistance with emphasis on glute/quad strengthening. Pt reported cramping in L hamstring - performed seated hamstring stretch to LLE using gait belt 3x20 second hold. Returned to room and pt reported urge to void. Pt's husband provided CGA and assisted with transfer from WC<>bedside commode and bedside commode<>bed and provided total  A for clothing management. Pt able to void and perform peri-care independently. Upon transferring back to bed, limb guard fell  off. Educated pt/husband that if limb guard falls off mid-transfer, to have pt's husband move it out of the way so pt can safely get sitting down rather than trying to put it back on while standing. Concluded session with pt sitting EOB with all needs within reach and husband present at bedside.   Therapy Documentation Precautions:  Precautions Precautions: Fall Precaution Comments: body habitus Required Braces or Orthoses: Other Brace Other Brace: limb protector Restrictions Weight Bearing Restrictions: Yes RLE Weight Bearing: Non weight bearing  Therapy/Group: Individual Therapy Martin Majestic PT, DPT   06/21/2021, 7:28 AM

## 2021-06-21 NOTE — Telephone Encounter (Signed)
Spoke with the patient who would like to know if there is any lab work that Dr. Mayford Knife needs that she has not gotten done in the hospital. Patient has been admitted for about a month.  Upon review it looks like FLP is needed after starting Nexlizet. Patient states that she actually discontinued Nexlizet because of the cost. Advised patient that we will follow up with her once she is discharged from the hospital.

## 2021-06-21 NOTE — Telephone Encounter (Signed)
Patient called to see if the labs that she had done while being in the hospital, can dr turner use as well. Please advise

## 2021-06-21 NOTE — Progress Notes (Signed)
Occupational Therapy Session Note  Patient Details  Name: [name] MRN: [id number] Date of Birth: [birth year]  Today's Date: 06/21/2021 OT Individual Time: 0805-0902 OT Individual Time Calculation (min): 57 min    Short Term Goals: Week 2:  OT Short Term Goal 1 (Week 2): STG = LTGs due to remaining LOS  Skilled Therapeutic Interventions/Progress Updates:  Skilled OT intervention completed with focus on ADL retraining and home environment education with functional transfers. Pt received seated EOB, agreeable to bathing to be completed in room shower. Nurse present for morning meds, advised to remove bandages covering superficial wounds for showering with plan for nurse to visit and recover after shower. Therapist covered residual limb with bag/tape prior to shower with total assist. Pt transferred sit > stand with CGA, then to w/c with stand pivot/hop with min A while using RW. Therapist transported pt into shower due to incline of shower entrance. Using RW and stand pivot/hop, pt transferred w/c > bariatric BSC in shower with min A. Verbal cues needed for positioning bariatric RW in tight space. Pt required min A to finish washing hair due to pt fatigue with lifting arms to head height and reports that is her baseline with help from her husband provided. Pt able to complete rest of UB bathing with set up A, LB with min A to wash LLE however pt able to wash buttocks using lateral leans and shower stick. Donned grip sock with total assist. Completed sit > stand from [facility] with CGA, then stand pivot hop to w/c using RW with min A due to closer space. Transferred w/c > EOB with CGA for pt to complete dressing tasks. Due to fatigue, pt required min A for UB dress for bra management, and therapist threaded shorts with total assist for time purposes. Education provided about the recommendation to avoid showers in pt's home even though she completed one this session, due to pt's bathroom set up and the  need to side hop with counter vs RW. Pt in agreement about completing chair/EOB level bathing tasks until [facility] can help better evaluate a safe transfer. Pt left seated EOB for grooming tasks, awaiting PT arrival to assist pt in donning shorts. Bed alarm activated, and PT in room at therapist departure.  Therapy Documentation Precautions:  Precautions Precautions: Fall Precaution Comments: body habitus Required Braces or Orthoses: Other Brace Other Brace: limb protector Restrictions Weight Bearing Restrictions: Yes RLE Weight Bearing: Non weight bearing  Pain: Pt with no c/o pain during session.   Therapy/Group: Individual Therapy  Teddy Rebstock E Josey Dettmann 06/21/2021, 10:02 AM
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
## 2021-06-21 NOTE — Progress Notes (Signed)
Physical Therapy Discharge Summary  Patient Details  Name: [name] MRN: [id number] Date of Birth: 1960-10-31  Patient has met 7 of 7 long term goals due to improved activity tolerance, improved balance, improved postural control, increased strength, decreased pain, ability to compensate for deficits, improved awareness, and improved coordination. Patient to discharge at a wheelchair level Supervision. Patient's care partner is independent to provide the necessary physical assistance at discharge. Pt's husband attended fmaily education training on 10/11 and verbalized and demonstrated confidence with all tasks to ensure safe discharge home. Practiced actual car transfer from [place] [facility] [name] and pt and husband both felt confident with transfer.   All goals met   Recommendation:  Patient will benefit from ongoing skilled PT services in home health setting to continue to advance safe functional mobility, address ongoing impairments in transfers, generalized strengthening, dynamic standing balance/coordination, gait training, endurance, and to minimize fall risk.  Equipment: No equipment provided; pt has her own WC and purchased her own bariatric RW and bed assist rail  Reasons for discharge: treatment goals met  Patient/family agrees with progress made and goals achieved: Yes  PT Discharge Precautions/Restrictions Precautions Precautions: Fall Precaution Comments: body habitus Required Braces or Orthoses: Other Brace Other Brace: limb protector Restrictions Weight Bearing Restrictions: Yes RLE Weight Bearing: Non weight bearing Pain Interference Pain Interference Pain Effect on Sleep: 4. Almost constantly (back pain) Pain Interference with Therapy Activities: 1. Rarely or not at all Pain Interference with Day-to-Day Activities: 1. Rarely or not at all Cognition Overall Cognitive Status: Within Functional Limits for tasks assessed Arousal/Alertness: Awake/alert Orientation  Level: Oriented X4 Memory: Appears intact Awareness: Appears intact Problem Solving: Appears intact Safety/Judgment: Appears intact Sensation Sensation Light Touch: Impaired by gross assessment Proprioception: Impaired by gross assessment Additional Comments: absent senastion along incision of R residual limb and along L medial malleoli and L great toe. 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, body habitus, LBP, weakness/deconditioning  Mobility Bed Mobility Bed Mobility: Rolling Right;Rolling Left;Sit to Supine;Supine to Sit Rolling Right: Independent with assistive device Rolling Left: Independent with assistive device Supine to Sit: Independent with assistive device Sit to Supine: Independent with assistive device Transfers Transfers: Sit to Stand;Stand to Sit;Stand Pivot Transfers Sit to Stand: Supervision/Verbal cueing Stand to Sit: Supervision/Verbal cueing Stand Pivot Transfers: Supervision/Verbal cueing Stand Pivot Transfer Details: Verbal cues for precautions/safety Stand Pivot Transfer Details (indicate cue type and reason): verbal cues to ensure balance once standing prior to pivoting Transfer (Assistive device): Rolling walker Locomotion  Gait Ambulation: No Gait Gait: No Stairs / Additional Locomotion Stairs: No Wheelchair Mobility Wheelchair Mobility: Yes Wheelchair Assistance: Chartered loss adjuster: Both upper extremities Wheelchair Parts Management:  Needs assistance Distance: 131ft  Trunk/Postural Assessment  Cervical Assessment Cervical Assessment: Exceptions to [facility] (forward head) Thoracic Assessment Thoracic Assessment: Exceptions to [facility] (rounded shoulders) Lumbar Assessment Lumbar Assessment: Exceptions to [facility] (posterior pelvic tilt) Postural Control Postural Control: Deficits on evaluation (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)  Balance Balance Balance Assessed: Yes Static Sitting Balance Static Sitting - Balance Support: Feet supported;Bilateral upper  extremity supported Static Sitting - Level of Assistance: 7: Independent Dynamic Sitting Balance Dynamic Sitting - Balance Support: Feet supported;No upper extremity supported Dynamic Sitting - Level of Assistance: 6: Modified independent (Device/Increase time) Static Standing Balance Static Standing - Balance Support: Bilateral upper extremity supported (RW) Static Standing - Level of Assistance: 5: Stand by assistance (supervision) Dynamic Standing Balance Dynamic Standing - Balance Support: Bilateral upper extremity supported (RW) Dynamic Standing - Level of Assistance: 5: Stand by assistance (supervision) Dynamic Standing - Comments: with transfers only Extremity Assessment  RLE Assessment RLE Assessment: Exceptions to [facility] RLE Strength Right Hip Flexion: 4/5 Right Hip ABduction: 4-/5 Right Hip ADduction: 4-/5 Right Knee Flexion: 4/5 Right Knee Extension: 4-/5 LLE Assessment LLE Assessment: Exceptions to [facility] LLE Strength Left Hip Flexion: 4/5 Left Hip ABduction: 4/5 Left Hip ADduction: 4/5 Left Knee Flexion: 4/5 Left Knee Extension: 4/5 Left Ankle Dorsiflexion: 4/5 Left Ankle Plantar Flexion: 4/5  [name] M Lyn Hollingshead PT, DPT  06/21/2021, 7:50 AM
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
## 2021-06-22 LAB — GLUCOSE, CAPILLARY
Glucose-Capillary: 162 mg/dL — ABNORMAL HIGH (ref 70–99)
Glucose-Capillary: 131 mg/dL — ABNORMAL HIGH (ref 70–99)
Glucose-Capillary: 74 mg/dL (ref 70–99)
Glucose-Capillary: 157 mg/dL — ABNORMAL HIGH (ref 70–99)

## 2021-06-22 MED ORDER — INSULIN GLARGINE-YFGN 100 UNIT/ML ~~LOC~~ SOLN
57.0000 [IU] | Freq: Every day | SUBCUTANEOUS | Status: DC
  Administered 2021-06-23 – 2021-06-24 (×2): 57 [IU] via SUBCUTANEOUS
  Filled 2021-06-22 (×3): qty 0.57

## 2021-06-22 MED ORDER — CEPHALEXIN 250 MG PO CAPS
500.0000 mg | ORAL_CAPSULE | Freq: Two times a day (BID) | ORAL | Status: DC
  Administered 2021-06-22 – 2021-06-24 (×5): 500 mg via ORAL
  Filled 2021-06-22 (×5): qty 2

## 2021-06-22 MED ORDER — CYCLOBENZAPRINE HCL 10 MG PO TABS
10.0000 mg | ORAL_TABLET | Freq: Three times a day (TID) | ORAL | Status: DC | PRN
  Administered 2021-06-22 – 2021-06-23 (×4): 10 mg via ORAL
  Filled 2021-06-22 (×4): qty 1

## 2021-06-22 NOTE — Progress Notes (Signed)
Patient ID: [name], female   DOB: October 05, 1960, 60 y.o.   MRN: [id number] Team Conference Report to Patient/Family  Team Conference discussion was reviewed with the patient and caregiver, including goals, any changes in plan of care and target discharge date.  Patient and caregiver express understanding and are in agreement.  The patient has a target discharge date of 06/24/21.  SW met with pt on 10/12 to informed pt of conference updates. Pt expresses feeling confident with transfer on yesterday and education with spouse. Pt confirmed she has all DME ordered. No additional questions or concerns, SW will continue to follow up.   Dyanne Iha 06/22/2021, 9:45 AM

## 2021-06-22 NOTE — Progress Notes (Signed)
Physical Therapy Session Note  Patient Details  Name: [name] MRN: [id number] Date of Birth: [birth year]  Today's Date: 06/22/2021 PT Individual Time: 0800-0908 and 1030-1054 PT Individual Time Calculation (min): 68 min and 24 min  Short Term Goals: Week 1:  PT Short Term Goal 1 (Week 1): STG=LTG due to LOS Week 2:  PT Short Term Goal 1 (Week 2): STG=LTG due to extended LOS  Skilled Therapeutic Interventions/Progress Updates:   Treatment Session 1 Received pt sitting EOB, pt agreeable to PT treatment, and reported pain 6/10 in low back (premedicated). Repositioning, rest breaks, and distraction done to reduce pain levels. Session with emphasis on dressing, functional mobility/transfers, generalized strengthening, dynamic standing balance/coordination, and improved activity tolerance. Doffed nightgown and donned sports bra and pull over shirt with set up assist. Donned shorts sitting EOB with supervision via lateral leans. Donned L compression sock and shoe with max/total A and R limb guard with supervision. Pt transferred bed<>WC stand<>pivot with bariatric RW and CGA. Pt then performed WC mobility 59ft using BUE and supervision but stopped due to UE fatigue and difficulty pushing personal WC (plans to get new wheels upon D/C). Pt transported to ortho gym and performed x 3 additional stand<>pivot transfers with bariatric RW and CGA/close supervision throughout session. Worked on dynamic standing balance tossing horseshoes using LUE x 3 trials with CGA/close supervision for balance. Attempted with RUE but unable to pick up horseshoe without LOB. Pt required multiple rest breaks throughout session due to fatigue/SOB. Pt performed the following exercises sitting EOM with supervision and verbal cues for technique with emphasis on LE strength and ROM: -hip adduction ball squeezes 2x15 -L LAQ with .75lb ankle weight 2x15 -L hip flexion with .75lb ankle weight 2x12 Pt reported 8/10 fatigue after  exercising and reported L leg felt "wobbly" and required increased time to regain strength to perform last 2 stand<>pivot transfers. Pt transported back to room in [facility] total A. Concluded session with pt sitting EOB with all needs with in reach.   Treatment Session 2 Received pt sitting EOB reporting urgent need to use restroom and having restless leg syndrome "all over her body" - RN returned shortly with medication. Pt agreeable to PT treatment and reported pain pain in low back and "soreness" in residual limb. Session with emphasis on functional mobility/transfers, toileting, generalized strengthening, and improved activity tolerance. Donned L shoe with max A and transferred to/from bedside commode with bariatric RW and CGA. Pt required total A for clothing management and continent of bowel and bladder. Pt required total A for peri-care due to low back pain. MD arrived and pt requested MD inspect residual limb. Concluded session with pt sitting EOB with MD and RN present at bedside attending to care.   Therapy Documentation Precautions:  Precautions Precautions: Fall Precaution Comments: body habitus Required Braces or Orthoses: Other Brace Other Brace: limb protector Restrictions Weight Bearing Restrictions: Yes RLE Weight Bearing: Non weight bearing  Therapy/Group: Individual Therapy Martin Majestic PT, DPT   06/22/2021, 7:31 AM

## 2021-06-22 NOTE — Progress Notes (Signed)
Occupational Therapy Session Note  Patient Details  Name: [name] MRN: [id number] Date of Birth: Dec 30, 1960  Today's Date: 06/22/2021 OT Individual Time: 4917-9150 OT Individual Time Calculation (min): 42 min    Short Term Goals: Week 2:  OT Short Term Goal 1 (Week 2): STG = LTGs due to remaining LOS  Skilled Therapeutic Interventions/Progress Updates:  Skilled OT intervention completed with focus on discharge planning, including discussion of home environment and education about how to manage returning to desired activities. Pt received seated EOB, agreeable to therapy session however reported feeling nauseous, and increased back pain this session. Pt reported feeling increased fatigue. Discussed d/c plans, rehab goals and addressed questions pt had regarding returning home including how to manage the environment with her dog with safe tranfers. Educated pt to try to have husband contain the dog during moments she knows she will be transferring, and to use extra caution. Education also provided on protecting the residual limb from dog (licking, scratching etc) to prevent further infection. Pt advised to keep limb covered at all times with the black shrinker even when sleeping, as pt reported her dog sleeps under the covers at night with her. Educated on importance of checking residual limb with mirror at least twice a day to prevent complications/infection as pt reported MD said she already had an infection starting. RN visited with meds. Pt completed bed mobility with supervision, repositioned pt for comfort and provided all needs in reach with bed alarm activated at end of session.  Therapy Documentation Precautions:  Precautions Precautions: Fall Precaution Comments: body habitus Required Braces or Orthoses: Other Brace Other Brace: limb protector Restrictions Weight Bearing Restrictions: Yes RLE Weight Bearing: Non weight bearing  Pain: Pt with c/o pain in low back, nausea  and chills. Notified NT, with RN returning for pain/nausea meds. Repositioned pt for low back discomfort, provided warm water for tea upon pt's request for stomach.   Therapy/Group: Individual Therapy  Aldo Sondgeroth E Ayline Dingus 06/22/2021, 12:34 PM

## 2021-06-22 NOTE — Progress Notes (Signed)
PROGRESS NOTE   Subjective/Complaints: She was having the heebie jeebies this morning and asked for her Mirapex.  Having increased residual limb pain- examined and there is slough, lessening drainage  ROS: thrush-  Pt denies SOB, abd pain, CP, N/V/C/D, and vision changes, +low back pain, +residual limb pain, +dysuria, +restless legs  Objective:   No results found. Recent Labs    06/20/21 0704  WBC 8.6  HGB 11.6*  HCT 38.3  PLT 405*    Recent Labs    06/20/21 0704  NA 135  K 4.3  CL 97*  CO2 26  GLUCOSE 142*  BUN 15  CREATININE 0.93  CALCIUM 9.5     Intake/Output Summary (Last 24 hours) at 06/22/2021 1157 Last data filed at 06/22/2021 0700 Gross per 24 hour  Intake 640 ml  Output --  Net 640 ml        Physical Exam: Vital Signs Blood pressure 140/87, pulse 92, temperature 98.3 F (36.8 C), temperature source Oral, resp. rate 17, height 5\' 2"  (1.575 m), weight (!) 142.1 kg, SpO2 98 %. Gen: no distress, normal appearing, BMI 62.14   General: awake, alert, appropriate, sitting up EOB; NAD HENT: conjugate gaze; oropharynx dry, and has thrush and erythema on sides of mouth as well CV: Tahcycardic Pulmonary: CTA B/L; no W/R/R- good air movement GI: soft, NT, ND, (+)BS Neurological: Ox3  Psych: pleasant, normal affect, bright and positive.  Skin:    Neuro: Alert and oriented x3 Musculoskeletal: Right sided residual limb with good range of motion, shrinker in place. 30-45 second standing tolerance. Can stand pivot with RW. Left foot with decreased sensation to great toe with darkening of skin Motor: LLE: HF 3--3/5, KE 4+/5, ADF 5/5 RLE: HF, KE 4-/5 (some pain inhibition)    Assessment/Plan: 1. Functional deficits which require 3+ hours per day of interdisciplinary therapy in a comprehensive inpatient rehab setting. Physiatrist is providing close team supervision and 24 hour management of active  medical problems listed below. Physiatrist and rehab team continue to assess barriers to discharge/monitor patient progress toward functional and medical goals  Care Tool:  Bathing    [name]: Right arm, Left arm, Chest, Abdomen, Front perineal area, Buttocks, Right upper leg, Left upper leg, Face, Left lower leg   [name]: Left lower leg, Front perineal area Body parts n/a: Right lower leg   Bathing assist Assist Level: Set up assist     Upper Body Dressing/Undressing Upper body dressing   What is the patient wearing?: Pull over shirt, Bra    Upper body assist Assist Level: Set up assist    Lower Body Dressing/Undressing Lower body dressing    Lower body dressing activity did not occur: Refused What is the patient wearing?: Pants, Underwear/pull up     Lower body assist Assist for lower body dressing: Minimal Assistance - Patient > 75%     Toileting Toileting    Toileting assist Assist for toileting: Minimal Assistance - Patient > 75%     Transfers Chair/bed transfer  Transfers assist     Chair/bed transfer assist level: Contact Guard/Touching assist     Locomotion Ambulation  Ambulation assist   Ambulation activity did not occur: Safety/medical concerns (pain, weakness, decreased balance/postural control)          Walk 10 feet activity   Assist  Walk 10 feet activity did not occur: Safety/medical concerns (pain, weakness, decreased balance/postural control)        Walk 50 feet activity   Assist Walk 50 feet with 2 turns activity did not occur: Safety/medical concerns (pain, weakness, decreased balance/postural control)         Walk 150 feet activity   Assist Walk 150 feet activity did not occur: Safety/medical concerns (pain, weakness, decreased balance/postural control)         Walk 10 feet on uneven surface  activity   Assist Walk 10 feet on uneven surfaces activity did not occur:  Safety/medical concerns (pain, weakness, decreased balance/postural control)         Wheelchair     Assist Is the patient using a wheelchair?: Yes Type of Wheelchair: Manual    Wheelchair assist level: Supervision/Verbal cueing Max wheelchair distance: 14ft    Wheelchair 50 feet with 2 turns activity    Assist        Assist Level: Dependent - Patient 0%   Wheelchair 150 feet activity     Assist      Assist Level: Dependent - Patient 0%   Blood pressure 140/87, pulse 92, temperature 98.3 F (36.8 C), temperature source Oral, resp. rate 17, height 5\' 2"  (1.575 m), weight (!) 142.1 kg, SpO2 98 %.  Medical Problem List and Plan: 1.  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             -patient may not shower             -ELOS/Goals: modI 16 days Appreciate ortho note 10/1             Continue CIR- PT, OT   2.  Impaired mobility: -DVT/anticoagulation:  Pharmaceutical: Continue Lovenox             -antiplatelet therapy: on ASA  3. Residual limb pain: used Oxycodone, Flexeril and gabapentin TID PTA.-->Dr. [facility])             --has sharp shooting pains in RLE but has had SE with higher doses.   -provided with list of foods for pain control             --willing to try lower dose Lyrica for neuropathy/phantom pain.   -change oxycodone 20mg  to prn for 9-10 pain  -add 15mg  oxycodone prn for 8/10 pain  -encouraged to use Tramadol prn for pain that is 6/10 pain  -patient states she does not request medication if pain is 5/10 or less.              Monitor with increased exertion  Continue Lyrica 25mg  HS  -provided with pain relief journal  -add back flexeril 10mg  TID PRN 4. Mood: LCSW to follow for evaluation and support.              -antipsychotic agents: N/A 5. [name]: This patient is capable of making decisions on her own behalf. 6. Residual limb yellow slough: Routine pressure  relief measures.  As per ortho Dr [facility], shrinker to applied directly to stump, may use ABD on outside of shrinker if excess drainage, requested ortho eval --add santyl to yellow eschar on burn sites on breast and on back.  7. Fluids/Electrolytes/Nutrition:  Monitor I/O. Check lytes in am.  8. [name]: Hgb A1c-7.3. Was on metformin 1000 mg bid with Humulin --Continue Insulin glargline  with novolog for meal coverage. Resume metformin.  Uncontrolled:  --Will monitor BS ac/hs and use SSI for tighter control.  CBG (last 3)  Recent Labs    06/21/21 1704 06/21/21 2114 06/22/21 0604  GLUCAP 97 203* 162*  Elevated in the evening we will continue to monitor Increase Semglee to 57unit qd and novolog 22U TID -provided with list of foods for better blood sugar control 9. Bacteremia: Ampicillin course completed 9/28 10. HTN: Monitor BP TID--continue Avapro, Imdur and Cardizem.               Vitals:   06/21/21 1951 06/22/21 0623  BP: (!) 157/82 140/87  Pulse: 89 92  Resp: 17 17  Temp: 98.2 F (36.8 C) 98.3 F (36.8 C)  SpO2: 100% 98%  Controlled, 06/11/2021 11. Connective tissue d/o/Sjogren's: Followed Dr.J. Bravo ([facility])             --continue Plaquenil. Husband to bring eye drops from home.  11. CAD s/p PCI/chronic systolic CHF: Monitor for symptoms with increase in activity.  --On ASA, Imdur and Avapro.  No statin due to NAFL? --monitor for signs of overload.  12. IBS-D: Has been refusing laxatives due to concerns of diarrhea but hard stools reported.              --willing to try one Senna S daily -->monitor for results/SE  Discussed that zinc can help with wound healing but cause constipation- she defers  10/2- having loose stools- but no Abd cramping/pain or leukocytosis, so won't check C Diff.  13. Asthma: Managed with prn MDI use. Albuterol inhaler added prn.  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 Will check LFTs in am.  15. Acute blood loss anemia: Hgb 9.9 on 9/29,  repeat Monday 16. Chronic lymphedema: Continue support stockings. Heart healthy diet.  17. Lower back pain: add kpad and lidocaine patch, voltaren gel when patch is off PRN. Ordered second lidocaine patch so she can have one on 24/7 if pain is very severe 18. Morbid obese 62.14: provide dietary education, provided with list of foods for weight loss 19. Residual limb muscle cramps: change flexeril to PRN as has been on this for a long time and is likely tolerant to it, add magnesium gluconate 250mg  HS 20.Thrush  10/2- will restart Diflucan 200 mg x1 and then 100 mg daily x 6 days.  21. Polypharmacy: reviewed all medications with patient: discussed plan for oxycodone taper-encourage use of 15mg  instead of 20mg  22. Dry eyes: may use eye drops from home. 23. Left foot decreased sensation to big toe: advised daily diabetic foot exam. Messaged Christina regarding getting her a diabetic shoe. Discussed foods that can boost nitric oxide production 24, Fatigue: continue NAC 600mg  BID which she has purchased and brought in, may use from home 25. Nausea: continue phenergan prn 26. Diarrhea: d/c magnesium 27. UTI: keflex started 28. Disposition: HFU scheduled. Plan for Friday   LOS: 14 days A FACE TO FACE EVALUATION WAS PERFORMED  Kendrick Remigio 06/22/2021, 11:57 AM

## 2021-06-22 NOTE — Progress Notes (Signed)
Occupational Therapy Session Note  Patient Details  Name: [name] MRN: [id number] Date of Birth: Oct 24, 1960  Today's Date: 06/22/2021 OT Individual Time: 1115-1200 OT Individual Time Calculation (min): 45 min    Short Term Goals: Week 2:  OT Short Term Goal 1 (Week 2): STG = LTGs due to remaining LOS  Skilled Therapeutic Interventions/Progress Updates:  Skilled OT intervention completed with focus on preparations for d/c, and BUE strengthening. Pt received seated EOB in room, agreeable to therapy. Pt reported feeling very fatigued, with increased back pain and just "not feeling right." Education about d/c provided about status of goals, recommendations for ADLs to be completed in the home and assist level recommended to be provided by husband. Pt completed donning/doffing of socks/shoes with set up assist. While seated EOB, pt completed HEP BUE strengthening exercises including the following with orange theraband:  - Bicep flexion 2x12 - Horizontal shoulder abduction 2x12 - shoulder flexion 2x12  Pt opted to stay seated EOB for lunch, with bed alarm activated and all needs within reach at end of session.  Therapy Documentation Precautions:  Precautions Precautions: Fall Precaution Comments: body habitus Required Braces or Orthoses: Other Brace Other Brace: limb protector Restrictions Weight Bearing Restrictions: Yes RLE Weight Bearing: Non weight bearing  Pain: Pt with c/o back and residual limb pain this session, with report that RN was aware and supposed to be bringing meds. Repositioned pt with pillows for back support.  Therapy/Group: Individual Therapy  [name] 06/22/2021, 8:51 AM

## 2021-06-22 NOTE — Progress Notes (Signed)
Occupational Therapy Discharge Summary  Patient Details  Name: [name] MRN: [id number] Date of Birth: 14-Jun-1961  Patient has met 9 of 9 long term goals due to improved activity tolerance, improved balance, postural control, and ability to compensate for deficits.  Patient to discharge at supervision level for stand pivot/sit>stand transfers, however pt is not recommended to complete tub/shower transfers at this time due to inability to fit bariatric RW or w/c in pt's home bathroom, but is min A for walk in shower when completed with OT in hospital bathroom. Patient's husband to provide supervision assistance, min assist LB bathing, and min-mod A for LB dressing at discharge.    Reasons goals not met: n/a  Recommendation:  Patient will benefit from ongoing skilled OT services in home health setting to continue to advance functional skills in the area of BADL, iADL, and Reduce care partner burden.  Equipment: Bariatric drop arm BSC recommended  Reasons for discharge: treatment goals met  Patient/family agrees with progress made and goals achieved: Yes  OT Discharge Precautions/Restrictions  Precautions Precautions: Fall Precaution Comments: body habitus Required Braces or Orthoses: Other Brace Other Brace: limb protector Restrictions Weight Bearing Restrictions: Yes RLE Weight Bearing: Non weight bearing Vital Signs Therapy Vitals Temp: 98.3 F (36.8 C) Temp Source: Oral Pulse Rate: 92 Resp: 17 BP: 140/87 Patient Position (if appropriate): Sitting Oxygen Therapy SpO2: 98 % O2 Device: Room Air Pain No c/o pain during session. ADL ADL Eating: Independent Where Assessed-Eating: Edge of bed Grooming: Modified independent Where Assessed-Grooming: Sitting at sink Upper Body Bathing: Setup Where Assessed-Upper Body Bathing: Shower Lower Body Bathing: Minimal assistance Where Assessed-Lower Body Bathing: Shower Upper Body Dressing: Setup Where Assessed-Upper Body  Dressing: Edge of bed Lower Body Dressing: Moderate assistance Where Assessed-Lower Body Dressing: Edge of bed Toileting: Minimal assistance Where Assessed-Toileting: Bedside Commode Toilet Transfer: Therapist, music Method: Stand pivot Science writer: Extra wide drop arm bedside commode Tub/Shower Transfer: Unable to assess Social research officer, government: Minimal assistance Social research officer, government Method: Radiographer, therapeutic: Other (comment) (Bariatric drop arm BSC) Vision Baseline Vision/History: 1 Wears glasses (pt reports she is supposed to wear glasses all the time and for reading specifically, but has not during any of our sessions) Patient Visual Report: No change from baseline Vision Assessment?: No apparent visual deficits Perception  Perception: Within Functional Limits Praxis Praxis: Intact Cognition Overall Cognitive Status: Within Functional Limits for tasks assessed Arousal/Alertness: Awake/alert Orientation Level: Oriented X4 Year: 2022 Month: October Day of Week: Correct Memory: Appears intact Immediate Memory Recall: Sock;Blue;Bed Memory Recall Sock: Without Cue Memory Recall Blue: Without Cue Memory Recall Bed: Without Cue Awareness: Appears intact Problem Solving: Appears intact Safety/Judgment: Appears intact Sensation Sensation Light Touch: Impaired by gross assessment (bilateral fingertips) Proprioception: Appears Intact Additional Comments: Sensation WFL for BUE, however pt report of slight tingling in bilateral fingertips Coordination Gross Motor Movements are Fluid and Coordinated: No Fine Motor Movements are Fluid and Coordinated: Yes Coordination and Movement Description: WFL for fine motor cooridination, however pt with increased time/decreased precision during finger opposition Finger Nose Finger Test: [facility] bilaterally Motor  Motor Motor: Abnormal postural alignment and control Motor - Skilled Clinical  Observations: 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, body habitus, LBP, weakness/deconditioning Mobility  Bed Mobility Bed Mobility: Rolling Right;Rolling Left;Sit to Supine;Supine to Sit Rolling Right: Independent with assistive device Rolling Left: Independent with assistive device Supine to Sit: Independent with assistive device Sit to Supine: Independent with assistive device Transfers  Sit to Stand: Supervision/Verbal cueing Stand to Sit: Supervision/Verbal cueing  Trunk/Postural Assessment  Cervical Assessment Cervical Assessment: Exceptions to [facility] (forward head) Thoracic Assessment Thoracic Assessment: Exceptions to [facility] (rounded shoulders) Lumbar Assessment Lumbar Assessment: Exceptions to [facility] (posterior pelvic tilt) Postural Control Postural Control: Deficits on evaluation (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)  Balance Balance Balance Assessed: Yes Static Sitting Balance Static Sitting - Balance Support: Feet supported;Bilateral upper extremity supported Static Sitting - Level of Assistance: 7: Independent Dynamic Sitting Balance Dynamic Sitting - Balance Support: Feet supported;No upper extremity supported Dynamic Sitting - Level of Assistance: 6: Modified independent (Device/Increase time) Static Standing Balance Static Standing - Balance Support: Bilateral upper extremity supported (RW) Static Standing - Level of Assistance: 5: Stand by assistance Dynamic Standing Balance Dynamic Standing - Balance Support: Bilateral upper extremity supported Dynamic Standing - Level of Assistance: 5: Stand by assistance (supervision) Dynamic Standing - Comments: with transfers only Extremity/Trunk Assessment RUE Assessment RUE Assessment: Within Functional Limits LUE Assessment LUE Assessment: Within Functional Limits   [name] 06/22/2021, 7:51 AM

## 2021-06-23 LAB — URINE CULTURE: Culture: >=100000 — AB

## 2021-06-23 LAB — GLUCOSE, CAPILLARY
Glucose-Capillary: 146 mg/dL — ABNORMAL HIGH (ref 70–99)
Glucose-Capillary: 111 mg/dL — ABNORMAL HIGH (ref 70–99)
Glucose-Capillary: 125 mg/dL — ABNORMAL HIGH (ref 70–99)
Glucose-Capillary: 118 mg/dL — ABNORMAL HIGH (ref 70–99)
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
## 2021-06-23 NOTE — Progress Notes (Signed)
Occupational Therapy Session Note  Patient Details  Name: [name] MRN: [id number] Date of Birth: 10/05/1960  Today's Date: 06/23/2021 OT Individual Time: 5188-4166 OT Individual Time Calculation (min): 74 min    Short Term Goals: Week 2:  OT Short Term Goal 1 (Week 2): STG = LTGs due to remaining LOS  Skilled Therapeutic Interventions/Progress Updates:  Pt greeted EOB agreeable to OT intervention. Session focus on BADL reeducation, standing dynamic balance and energy conservation strategies for IADLs. Pt reported having already washed up this AM. Pt completed dressing from EOB with set- up assist with pt even able to don compression sock on LLE from EOB, pt donned limb guard with MOD A d/t pain. Pt completed stand pivot transfer from EOB>w/c with Rw and CGA. Pt transported to ADL apartment with total A for time mgmt. Discussed energy conservation tasks in kitchen such as organizing like items together, using reacher as needed and sitting to complete cooking tasks as needed. Worked on dynamic reaching in kitchen with pt able to sit<>stand with Rw with CGA to reach items in [facility] cabinets. Pt appreciative of education and reinforced importance of using activity analysis in kitchen and breaking down tasks into smaller steps for energy conservation. Additionally reviewed fall prevention strategies in kitchen with recommendation to have her husband present if she is standing in kitchen. Pt transported back to room with total A where pt completed stand pivot transfer back to bed with rw and cGA.  pt left seated EOB with all needs within reach.                   Therapy Documentation Precautions:  Precautions Precautions: Fall Precaution Comments: body habitus Required Braces or Orthoses: Other Brace Other Brace: limb protector Restrictions Weight Bearing Restrictions: Yes RLE Weight Bearing: Non weight bearing  Pain: pt reports unrated pain in RLE, offered rest breaks and repositioning as  pain mgmt strategy    Therapy/Group: Individual Therapy  Pollyann Glen [facility] 06/23/2021, 9:24 AM

## 2021-06-23 NOTE — Discharge Instructions (Addendum)
Inpatient Rehab Discharge Instructions  [name] Discharge date and time: 06/24/21   Activities/Precautions/ Functional Status: Activity: no lifting, driving, or strenuous exercise till cleared by MD Diet: diabetic diet Wound Care:  wash with dial soap, pat dry, apply Silver guaze, then 4 X 4 then cover with shrinker.  Functional status:  ___ No restrictions     ___ Walk up steps independently _X__ 24/7 supervision/assistance   ___ Walk up steps with assistance ___ Intermittent supervision/assistance  ___ Bathe/dress independently ___ Walk with walker     _X__ Bathe/dress with assistance ___ Walk Independently    ___ Shower independently ___ Walk with assistance    ___ Shower with assistance _X__ No alcohol     ___ Return to work/school ________  Special Instructions: Monitor blood sugars before meals and at bedtime.   COMMUNITY REFERRALS UPON DISCHARGE:    Home Health:   PT     OT    RN                      Agency: [facility]  Phone: [phone]    My questions have been answered and I understand these instructions. I will adhere to these goals and the provided educational materials after my discharge from the hospital.  Patient/Caregiver Signature _______________________________ Date __________  Clinician Signature _______________________________________ Date __________  Please bring this form and your medication list with you to all your follow-up doctor's appointments.

## 2021-06-23 NOTE — Progress Notes (Signed)
Physical Therapy Session Note  Patient Details  Name: [name] MRN: [id number] Date of Birth: 1961/01/17  Today's Date: 06/23/2021 PT Individual Time: 1191-4782 and 1415-1440 PT Individual Time Calculation (min): 55 min and 25 min  Short Term Goals: Week 1:  PT Short Term Goal 1 (Week 1): STG=LTG due to LOS Week 2:  PT Short Term Goal 1 (Week 2): STG=LTG due to extended LOS  Skilled Therapeutic Interventions/Progress Updates:   Treatment Session 1 Received pt sitting EOB, pt agreeable to PT treatment, and reported pain 4-5/10 in R residual limb (premedicated). Session with emphasis on functional mobility/transfers, generalized strengthening, dynamic standing balance/coordination, WC mobility, and improved endurance with activity.  Pt performed x 2 stand<>pivot transfers with bariatric RW and close supervision throughout session. Pt then performed WC mobility 146ft using BUE and supervision to dayroom with emphasis on UE strength and endurance. Sit<>stand with bariatric RW and supervision x 3 trials and performed the following exercises standing with BUE support and CGA for balance: -R hip flexion 2x10 -R hip abduction 2x10 -R hip extension x20 Worked on dynamic sitting balance, core strength, and endurance batting ball with 1lb dowel 1x10 and 2x20 reps to fatigue. Pt then performed the following exercises sitting in [facility] with supervision and verbal cues for technique: -hip flexion 2x12 bilaterally with .75lb ankle weight on LLE -LAQ on LLE 2x12 with .75lb ankle weight -WC pushups 2x8 Pt performed WC mobility additional 151ft using BUE and supervision back to room and requested to return to bed. Concluded session with pt sitting EOB with all needs within reach.   Treatment Session 2 Received pt sitting in WC in dayroom, handoff from previous PT. Pt agreeable to PT treatment and denied any pain during session. Session with emphasis on generalized strengthening, dynamic standing  balance/coordination, and improved endurance with activity. Pt performed LLE strengthening on Kinetron at 20/cm sec for 1 minute 18 seconds and 2 minutes and 19 seconds with emphasis on glute/quad strength to fatigue with therapist providing manual counter resistance. Pt transported back to room in [facility] total A and transferred WC<>bed stand<>pivot with RW and supervision. Concluded session with pt sitting EOB with all needs within reach.   Therapy Documentation Precautions:  Precautions Precautions: Fall Precaution Comments: body habitus Required Braces or Orthoses: Other Brace Other Brace: limb protector Restrictions Weight Bearing Restrictions: Yes RLE Weight Bearing: Non weight bearing  Therapy/Group: Individual Therapy Martin Majestic PT, DPT   06/23/2021, 7:22 AM

## 2021-06-23 NOTE — Progress Notes (Signed)
Physical Therapy Session Note  Patient Details  Name: [name] MRN: [id number] Date of Birth: September 02, 1961  Today's Date: 06/23/2021 PT Individual Time: 1345-1415 PT Individual Time Calculation (min): 30 min   Short Term Goals: Week 2:  PT Short Term Goal 1 (Week 2): STG=LTG due to extended LOS  Skilled Therapeutic Interventions/Progress Updates: Pt presents sitting EOB and agreeable to therapy.  Pt performed multiple sit to stand at EOB w/ supervision and RW.  Pt stood x 1-2" performing LUE activities to challenge balance.  Pt transfers step-pivot to w/c w/ RW and supervision.  Pt wheeled to Dayroom for time conservation.  Pt performed seated "volleyball" hits w/ 2# weighted bar and beachball w/o UB support.  Pt remained in dayroom for next session and handed off to PT.     Therapy Documentation Precautions:  Precautions Precautions: Fall Precaution Comments: body habitus Required Braces or Orthoses: Other Brace Other Brace: limb protector Restrictions Weight Bearing Restrictions: Yes RLE Weight Bearing: Non weight bearing General:   Vital Signs: Therapy Vitals Temp: 97.8 F (36.6 C) Temp Source: Oral Pulse Rate: 99 Resp: 18 BP: 129/75 Patient Position (if appropriate): Sitting Oxygen Therapy SpO2: 97 % O2 Device: Room Air Pain:0/10       Therapy/Group: Individual Therapy  Lucio Edward 06/23/2021, 2:19 PM

## 2021-06-23 NOTE — Progress Notes (Signed)
Patient requested pain meds around 11:30 pm. Patient stated she had pain in lower back and "sciatica pain going down both legs". While looking into her chart and what I could give we started to have a conversation about pain medication and how she was able to "ween herself off of Fentanyl 100 mcg patches" and how difficult and painful it was. She continued to talk about the different pain meds she currently gets prescribed by her Neurologist who she stated is responsible for her pain management, she stated she" is going to talk to her Neuro about adding tramadol to her list since it doesn't make me stupid like oxy does". After further conversation and educating the patient about controlled substances she stated "she was weaning off of other things before the hospital but decided while she was here she would indulge".

## 2021-06-24 ENCOUNTER — Other Ambulatory Visit (HOSPITAL_COMMUNITY): Payer: Self-pay

## 2021-06-24 ENCOUNTER — Telehealth: Payer: Self-pay | Admitting: Family

## 2021-06-24 ENCOUNTER — Telehealth: Payer: Self-pay

## 2021-06-24 DIAGNOSIS — T8149XA Infection following a procedure, other surgical site, initial encounter: Secondary | ICD-10-CM

## 2021-06-24 DIAGNOSIS — T8131XA Disruption of external operation (surgical) wound, not elsewhere classified, initial encounter: Secondary | ICD-10-CM

## 2021-06-24 DIAGNOSIS — B9689 Other specified bacterial agents as the cause of diseases classified elsewhere: Secondary | ICD-10-CM

## 2021-06-24 DIAGNOSIS — D62 Acute posthemorrhagic anemia: Secondary | ICD-10-CM

## 2021-06-24 DIAGNOSIS — N390 Urinary tract infection, site not specified: Secondary | ICD-10-CM

## 2021-06-24 LAB — GLUCOSE, CAPILLARY
Glucose-Capillary: 147 mg/dL — ABNORMAL HIGH (ref 70–99)
Glucose-Capillary: 104 mg/dL — ABNORMAL HIGH (ref 70–99)

## 2021-06-24 MED ORDER — DOXYCYCLINE HYCLATE 100 MG PO TABS
100.0000 mg | ORAL_TABLET | Freq: Two times a day (BID) | ORAL | Status: DC
  Administered 2021-06-24: 100 mg via ORAL
  Filled 2021-06-24: qty 1

## 2021-06-24 MED ORDER — DILTIAZEM HCL ER COATED BEADS 240 MG PO CP24
240.0000 mg | ORAL_CAPSULE | Freq: Every day | ORAL | 0 refills | Status: DC
  Filled 2021-06-24: qty 30, 30d supply, fill #0

## 2021-06-24 MED ORDER — GABAPENTIN 400 MG PO CAPS
1600.0000 mg | ORAL_CAPSULE | ORAL | 0 refills | Status: DC
  Filled 2021-06-24: qty 120, 30d supply, fill #0

## 2021-06-24 MED ORDER — CEPHALEXIN 500 MG PO CAPS
500.0000 mg | ORAL_CAPSULE | Freq: Two times a day (BID) | ORAL | 0 refills | Status: DC
  Filled 2021-06-24: qty 20, 10d supply, fill #0

## 2021-06-24 MED ORDER — PREGABALIN 25 MG PO CAPS
25.0000 mg | ORAL_CAPSULE | Freq: Every day | ORAL | 0 refills | Status: DC
  Filled 2021-06-24: qty 30, 30d supply, fill #0

## 2021-06-24 MED ORDER — EZETIMIBE 10 MG PO TABS
10.0000 mg | ORAL_TABLET | Freq: Every day | ORAL | 0 refills | Status: DC
  Filled 2021-06-24: qty 30, 30d supply, fill #0

## 2021-06-24 MED ORDER — CEPHALEXIN 500 MG PO CAPS
500.0000 mg | ORAL_CAPSULE | Freq: Four times a day (QID) | ORAL | 0 refills | Status: DC
  Filled 2021-06-24: qty 40, 10d supply, fill #0

## 2021-06-24 MED ORDER — HUMULIN R U-500 (CONCENTRATED) 500 UNIT/ML ~~LOC~~ SOLN
40.0000 [IU] | Freq: Three times a day (TID) | SUBCUTANEOUS | 0 refills | Status: DC
  Filled 2021-06-24: qty 20, 22d supply, fill #0

## 2021-06-24 MED ORDER — CEPHALEXIN 250 MG PO CAPS
500.0000 mg | ORAL_CAPSULE | Freq: Four times a day (QID) | ORAL | Status: DC
  Administered 2021-06-24: 500 mg via ORAL
  Filled 2021-06-24: qty 2

## 2021-06-24 MED ORDER — LIDOCAINE 5 % EX PTCH
1.0000 | MEDICATED_PATCH | CUTANEOUS | 0 refills | Status: DC
  Filled 2021-06-24: qty 60, 60d supply, fill #0

## 2021-06-24 MED ORDER — GLUCOSE BLOOD VI STRP: 1.0000 | ORAL_STRIP | Freq: Four times a day (QID) | 12 refills | Status: DC

## 2021-06-24 MED ORDER — OXYCODONE HCL 10 MG PO TABS
10.0000 mg | ORAL_TABLET | Freq: Three times a day (TID) | ORAL | 0 refills | Status: DC | PRN
  Filled 2021-06-24: qty 90, 30d supply, fill #0

## 2021-06-24 MED ORDER — SANTYL 250 UNIT/GM EX OINT
1.0000 "application " | TOPICAL_OINTMENT | Freq: Every day | CUTANEOUS | 0 refills | Status: DC
  Filled 2021-06-24: qty 30, 30d supply, fill #0

## 2021-06-24 MED ORDER — DOXYCYCLINE HYCLATE 100 MG PO TABS
100.0000 mg | ORAL_TABLET | Freq: Two times a day (BID) | ORAL | 0 refills | Status: DC
Start: 2021-06-24 — End: 2021-06-24
  Filled 2021-06-24: qty 14, 7d supply, fill #0

## 2021-06-24 MED ORDER — CYCLOBENZAPRINE HCL 10 MG PO TABS
10.0000 mg | ORAL_TABLET | Freq: Three times a day (TID) | ORAL | 0 refills | Status: DC | PRN
  Filled 2021-06-24: qty 90, 30d supply, fill #0

## 2021-06-24 MED ORDER — TRAMADOL HCL 50 MG PO TABS
50.0000 mg | ORAL_TABLET | Freq: Four times a day (QID) | ORAL | 0 refills | Status: DC | PRN
  Filled 2021-06-24: qty 28, 7d supply, fill #0

## 2021-06-24 MED ORDER — CAMPHOR-MENTHOL 0.5-0.5 % EX LOTN
TOPICAL_LOTION | CUTANEOUS | 0 refills | Status: DC | PRN
  Filled 2021-06-24: qty 222, fill #0

## 2021-06-24 MED ORDER — ACETAMINOPHEN 500 MG PO TABS
500.0000 mg | ORAL_TABLET | Freq: Three times a day (TID) | ORAL | 0 refills | Status: DC
  Filled 2021-06-24: qty 60, 15d supply, fill #0

## 2021-06-24 MED ORDER — ZINC SULFATE 220 (50 ZN) MG PO TABS
220.0000 mg | ORAL_TABLET | Freq: Every day | ORAL | 0 refills | Status: DC
  Filled 2021-06-24: qty 90, 90d supply, fill #0

## 2021-06-24 MED ORDER — TIZANIDINE HCL 2 MG PO TABS
2.0000 mg | ORAL_TABLET | Freq: Three times a day (TID) | ORAL | 0 refills | Status: DC
  Filled 2021-06-24: qty 90, 30d supply, fill #0

## 2021-06-24 MED ORDER — PRAMIPEXOLE DIHYDROCHLORIDE 1 MG PO TABS
1.0000 mg | ORAL_TABLET | Freq: Three times a day (TID) | ORAL | 0 refills | Status: DC
  Filled 2021-06-24: qty 90, 30d supply, fill #0

## 2021-06-24 MED ORDER — DICLOFENAC SODIUM 1 % EX GEL
2.0000 g | Freq: Four times a day (QID) | CUTANEOUS | 0 refills | Status: DC
  Filled 2021-06-24: qty 400, 28d supply, fill #0

## 2021-06-24 MED ORDER — HYDROCORTISONE 1 % EX CREA
1.0000 "application " | TOPICAL_CREAM | Freq: Three times a day (TID) | CUTANEOUS | 0 refills | Status: DC | PRN
  Filled 2021-06-24: qty 28, 6d supply, fill #0

## 2021-06-24 MED ORDER — ASCORBIC ACID 500 MG PO TABS
500.0000 mg | ORAL_TABLET | Freq: Two times a day (BID) | ORAL | 0 refills | Status: DC
  Filled 2021-06-24: qty 60, 30d supply, fill #0

## 2021-06-24 NOTE — Discharge Summary (Signed)
Physician Discharge Summary  Patient ID: SWAY GUTTIERREZ MRN: [id number] DOB/AGE: 60-Mar-1962 60 y.o.  Admit date: 06/08/2021 Discharge date: 06/24/2021  Discharge Diagnoses:  Principal Problem:   Unilateral complete BKA, right, subsequent encounter [facility]) Active Problems:   Abnormal LFTs   Mixed collagen vascular disease (HCC)   Morbid obesity (HCC)   Diabetic polyneuropathy (HCC)   Lumbar radiculopathy   Diabetes mellitus type 2 in obese [facility])   Essential hypertension   Acute blood loss anemia   Postoperative wound infection   UTI (urinary tract infection)   Superficial dehiscence of operation wound   Discharged Condition: stable  Significant Diagnostic Studies: N/A   Labs:  Basic Metabolic Panel: BMP Latest Ref Rng & Units 06/20/2021 06/13/2021 06/09/2021  Glucose 70 - 99 mg/dL 254(Y) 706(C) 376(E)  BUN 6 - 20 mg/dL 15 11 9   Creatinine 0.44 - 1.00 mg/dL 8.31 5.17  BUN/Creat Ratio 9 - 23 - - -  Sodium 135 - 145 mmol/L 135 139 136  Potassium 3.5 - 5.1 mmol/L 4.3 4.0 4.1  Chloride 98 - 111 mmol/L 97(L) 101 100  CO2 22 - 32 mmol/L 26 27 27   Calcium 8.9 - 10.3 mg/dL 9.5 9.3 8.9     CBC: CBC Latest Ref Rng & Units 06/20/2021 06/13/2021 06/09/2021  WBC 4.0 - 10.5 K/uL 8.6 8.9 7.1  Hemoglobin 12.0 - 15.0 g/dL 11.6(L) 10.2(L) 9.9(L)  Hematocrit 36.0 - 46.0 % 38.3 34.4(L) 32.8(L)  Platelets 150 - 400 K/uL 405(H) 398 364     CBG: Recent Labs  Lab 06/23/21 1117 06/23/21 1636 06/23/21 2133 06/24/21 0619 06/24/21 1142  GLUCAP 111* 125* 118* 147* 104*    Brief HPI:   [name] is a 60 y.o. female with history of [name], and AFL, chronic low back pain, super morbid obesity-BMI 62, Sjogren's/mixed connective tissue disorder, lymphedema with right/shin ulcers who was admitted on 05/26/2021 with sepsis from Enterococcus faecalis bacteremia secondary to cellulitis and chronic diabetic foot ulcer.  She was started on broad-spectrum antibiotic and underwent right BKA by  Dr. 46 on 06/01/2021.  TEE done was negative for endocarditis or valvular disease.  Dr. Lajoyce Corners recommended transition to ampicillin as anaerobic coverage not indicated when this was to continue 7 days post OR through 09/28 to treat bacteremia..  Lantus was added to help manage blood sugars.  She had issues with significant phantom pain but was reporting feeling the best in years.  Therapy was ongoing and CIR was recommended due to functional decline.   Hospital Course: Amenah Tucci Ames was admitted to rehab 06/08/2021 for inpatient therapies to consist of PT and OT at least three hours five days a week. Past admission physiatrist, therapy team and rehab RN have worked together to provide customized collaborative inpatient rehab.  Tylenol was decreased to 500 mg qid for pain management and oxycodone and tramadol have been used on as needed basis.  She did have increase in back pain felt to be due to muscle strain and oxycodone was titrated up to 20 mg TID, tizanidine was added and scheduled QID in addition to Flexeril as needed use.  Mirapex was also scheduled 3 times daily to help ongoing issues with restless leg syndrome.  She was educated on desensitization techniques and low-dose Lyrica was added to help manage neuropathy and she is tolerating this without side effects.    She was maintained on insulin glargine with novolin 22 units TID ac during her stay.  Her blood sugars have been monitored  with ac/hs CBG checks and SSI was use prn for tighter BS control.  Her p.o. intake has been good and blood sugars have been reasonably controlled.  Current doses were equal into her U 500 insulin R therefore she was transitioned back to her home regimen at discharge.  Her blood pressures were monitored on TID basis and have been stable on current regimen.  Follow-up labs showed abnormal LFTs to be resolving and electrolytes to be within normal limits.  CBC shows acute blood loss anemia is improving without  leukocytosis.Her respiratory status has been stable.   She did report dysuria with urinary frequency on 10/11 and was started on antibiotics empirically after urine culture sent.  UCS was positive for 100,000 colonies of E. coli and 30,000 colonies of Proteus mirabilis.    Her VAC was removed without difficulty and she was noted to have some signs of early ischemia, edema and ongoing serosanguineous drainage.  She started having dehiscence of wound with erythema as well as breakdown with yellow eschar.  She was maintained on Keflex for UTI as well as wound prophylaxis per discussion with Dr. Lajoyce Corners.  Local care has been ongoing but wound continued to decline with development of cellulitis and concerns of wound infection.  Ortho was contacted for evaluation of wound prior to discharge and recommended taking patient back to the OR next week for I&D/revision of wound.  She is to continue on oral antibiotics, continue wound care with Aquacel silver and compressive dressing daily.  She was advised to contact Ortho for any signs of worsening of infection or if she develops fevers or chills.  She has made good gains during her rehab stay and currently requires supervision to min assist.  She will continue to receive follow-up HHPT, HHOT abd HHRN by [facility] home health after discharge   Rehab course: During patient's stay in rehab weekly team conferences were held to monitor patient's progress, set goals and discuss barriers to discharge. At admission, patient required mod assist with basic ADL tasks and min assist with mobility. She  has had improvement in activity tolerance, balance, postural control as well as ability to compensate for deficits.  She is modified independent for grooming.  She requires set up assist for upper body bathing and dressing.  She requires min to mod assist for lower body care.  She requires supervision with verbal cues for balance and safety with sit to stand transfers.  She is able to propel  her wheelchair for 125 feet.  Family education was completed with husband.  Discharge disposition: 01-Home or Self Care  Diet: Carb modified  Special Instructions: Wash incision with antibacterial soap and water, pat dry, cover with Aquacel, dry 4 x 4 and compressive dressing.  Change daily or more frequently if soiled. Monitor blood sugars ac/hs and follow-up with PCP for further adjustment. GERD orthopedic for any issues with fevers, chills, worsening of wound or other signs of infection.  Allergies as of 06/24/2021       Reactions   Fish Allergy Anaphylaxis   INCLUDES OMEGA 3 OILS   Fish Oil Anaphylaxis, Swelling   THROAT SWELLS INCLUDES FISH AS A CLASS   Omega-3 Fatty Acids Swelling   Throat swelling  Has tolerated Glucerna nutritional drinks   Crestor [rosuvastatin] Other (See Comments)   Extreme joint pain, to this & other statins   Gabapentin Anxiety   Anxiety on high doses   Lovastatin Other (See Comments)   EXTREME JOINT PAIN   Metronidazole Nausea  Only, Swelling   Headache and shakes Nausea and vomiting   Red Yeast Rice [cholestin] Other (See Comments)   Muscle pain and severe joint pain.    Rotigotine Swelling   Extreme edema   Atrovent Hfa [ipratropium Bromide Hfa] Other (See Comments)   wheezing   Iodine Swelling   Red Yeast Rice Extract    Joint pain   Victoza [liraglutide]    Unsure of reaction    Enalapril Cough   Cough Cough   Suprep [na Sulfate-k Sulfate-mg Sulf] Nausea And Vomiting   Tape Other (See Comments)   Electrodes causes skin breakdown        Medication List     STOP taking these medications    acetaminophen 650 MG CR tablet Commonly known as: TYLENOL Replaced by: Acetaminophen Extra Strength 500 MG tablet   ampicillin 2 g in sodium chloride 0.9 % 100 mL   clotrimazole-betamethasone cream Commonly known as: Lotrisone   diltiazem 240 MG 24 hr capsule Commonly known as: Dilt-XR Replaced by: Cartia XT 240 MG 24 hr capsule    furosemide 20 MG tablet Commonly known as: Lasix   insulin aspart 100 UNIT/ML FlexPen Commonly known as: NOVOLOG   insulin aspart 100 UNIT/ML injection Commonly known as: novoLOG   insulin glargine-yfgn 100 UNIT/ML injection Commonly known as: SEMGLEE   magnesium oxide 400 MG tablet Commonly known as: MAG-OX   mupirocin ointment 2 % Commonly known as: BACTROBAN   Nexlizet 180-10 MG Tabs Generic drug: Bempedoic Acid-Ezetimibe   potassium bicarbonate 25 MEQ disintegrating tablet Commonly known as: K-LYTE       TAKE these medications    Acetaminophen Extra Strength 500 MG tablet Generic drug: acetaminophen Take 1 tablet (500 mg total) by mouth 4 (four) times daily -  with meals and at bedtime. Replaces: acetaminophen 650 MG CR tablet   albuterol (2.5 MG/3ML) 0.083% nebulizer solution Commonly known as: PROVENTIL USE 1 VIAL IN NEBULIZER EVERY 4 HOURS AS NEEDED FOR WHEEZING OR  FOR  SHORTNESS  OF  BREATH What changed:  See the new instructions. Another medication with the same name was removed. Continue taking this medication, and follow the directions you see here.   aspirin EC 81 MG tablet Take 81 mg by mouth daily.   camphor-menthol lotion Commonly known as: SARNA Apply topically as needed for itching.   Cartia XT 240 MG 24 hr capsule Generic drug: diltiazem Take 1 capsule (240 mg total) by mouth daily. Replaces: diltiazem 240 MG 24 hr capsule   cephALEXin 500 MG capsule Commonly known as: KEFLEX Take 1 capsule (500 mg total) by mouth every 6 (six) hours.   cyclobenzaprine 10 MG tablet Commonly known as: FLEXERIL Take 1 tablet (10 mg total) by mouth 3 (three) times daily as needed for muscle spasms. What changed: how much to take   diclofenac Sodium 1 % Gel Commonly known as: VOLTAREN Apply 2 g topically 4 (four) times daily.   diphenhydrAMINE 25 MG tablet Commonly known as: BENADRYL Take 25 mg by mouth daily as needed for allergies.   EPINEPHrine  0.3 mg/0.3 mL Soaj injection Commonly known as: EpiPen 2-Pak USE AS DIRECTED FOR SEVERE ALLERGIC REACTION. What changed:  how much to take how to take this when to take this reasons to take this additional instructions   esomeprazole 40 MG capsule Commonly known as: NEXIUM Take 40 mg by mouth daily as needed (Heartburn).   ezetimibe 10 MG tablet Commonly known as: ZETIA Take 1 tablet (10 mg  total) by mouth daily. Start taking on: June 25, 2021   fluticasone 50 MCG/ACT nasal spray Commonly known as: FLONASE USE 2 SPRAY(S) IN EACH NOSTRIL ONCE DAILY AS NEEDED FOR  ALLERGIES  OR  RHINITIS What changed: See the new instructions.   gabapentin 400 MG capsule Commonly known as: NEURONTIN Take 1 capsule by mouth in the morning, 1 capsule at noon, and 2 capsules at bed time. What changed: additional instructions   glucose blood test strip 1 each by Other route 4 (four) times daily. Contour Next strips What changed:  how to take this additional instructions   HUMULIN R 500 UNIT/ML injection Generic drug: insulin regular human CONCENTRATED Inject 0.08-0.3 mLs (40-150 Units total) into the skin 3 (three) times daily with meals. Notes to patient: Resume home regimen of 22 units three times a day with meals.    Hydrocortisone Max St 1 % Generic drug: hydrocortisone cream Apply 1 application topically 3 (three) times daily as needed for itching (minor skin irritation).   hydroxychloroquine 200 MG tablet Commonly known as: PLAQUENIL Take 1 tablet (200 mg total) by mouth 2 (two) times daily. Notes to patient: Hold this for a few days if able--can check with your rheumatologist also for input.    INSULIN SYRINGE 1CC/29G 29G X 1/2" 1 ML Misc Commonly known as: B-D INSULIN SYRINGE Use three times daily with insulin.  DX E11.9   isosorbide mononitrate 30 MG 24 hr tablet Commonly known as: IMDUR Take 1 tablet (30 mg total) by mouth daily.   levocetirizine 5 MG tablet Commonly  known as: XYZAL TAKE 1 TABLET BY MOUTH ONCE DAILY IN THE EVENING   levothyroxine 200 MCG tablet Commonly known as: SYNTHROID TAKE 1 TABLET BY MOUTH ONCE DAILY BEFORE BREAKFAST   lidocaine 5 % Commonly known as: LIDODERM Place 1 patch onto the skin daily. Remove & Discard patch within 12 hours or as directed by MD Notes to patient: You can purchase this over the counter--will try prior authorization   metFORMIN 500 MG 24 hr tablet Commonly known as: GLUCOPHAGE-XR Take 2 tablets by mouth twice daily   nitroGLYCERIN 0.4 MG SL tablet Commonly known as: NITROSTAT DISSOLVE ONE TABLET UNDER THE TONGUE EVERY 5 MINUTES AS NEEDED FOR CHEST PAIN.  DO NOT EXCEED A TOTAL OF 3 DOSES IN 15 MINUTES What changed: See the new instructions.   Oxycodone HCl 10 MG Tabs--Rx# 90 pills Take 1 tablet (10 mg total) by mouth every 8 (eight) hours as needed (pain). Notes to patient: YOU CAN TAKE 2 PILLS THREE TIMES A DAY AS NEEDED TILL FOLLOW UP WITH DR. Dalene Carrow. BRING IN ALL YOUR PAIN MEDS, GABAPENTIN, LYRICA AND MUSCLE RELAXERS FOR APPT.   pramipexole 1 MG tablet Commonly known as: MIRAPEX Take 1 tablet (1 mg total) by mouth 3 (three) times daily. What changed:  medication strength how much to take when to take this additional instructions Notes to patient: Now scheduled three times a day.    pregabalin 25 MG capsule Commonly known as: LYRICA Take 1 capsule (25 mg total) by mouth at bedtime.   promethazine 25 MG tablet Commonly known as: PHENERGAN TAKE 1 TABLET BY MOUTH EVERY 8 HOURS AS NEEDED FOR NAUSEA AND FOR VOMITING What changed:  how much to take when to take this reasons to take this additional instructions   saccharomyces boulardii 250 MG capsule Commonly known as: FLORASTOR Take 250 mg by mouth 3 (three) times daily.   Santyl ointment Generic drug: collagenase Apply 1  application topically daily. Notes to patient: Apply to yellow eschar on breast and cover with damp to dry  dressing   tiZANidine 2 MG tablet Commonly known as: ZANAFLEX Take 1 tablet (2 mg total) by mouth 3 (three) times daily.   traMADol 50 MG tablet Commonly known as: ULTRAM Take 1 tablet (50 mg total) by mouth every 6 (six) hours as needed for moderate pain.   valsartan 160 MG tablet Commonly known as: DIOVAN Take 1 tablet (160 mg total) by mouth daily.   vitamin C 500 MG tablet Commonly known as: ASCORBIC ACID Take 1 tablet (500 mg total) by mouth 2 (two) times daily.   Vitamin D (Ergocalciferol) 1.25 MG (50000 UNIT) Caps capsule Commonly known as: DRISDOL TAKE 1 CAPSULE BY MOUTH ONCE A WEEK (EVERY  7  DAYS)  TAKE  ON  FRIDAY What changed:  how much to take how to take this when to take this   Zinc Sulfate 220 (50 Zn) MG Tabs Take 1 tablet (220 mg total) by mouth daily.        Follow-up Information     Raulkar, Drema Pry, MD Follow up.   Specialty: Physical Medicine and Rehabilitation Why: 07/15/21 please arrive at 12:40pm for 1:00pm, thank you! Contact information: [address] [place] [address] [phone]         Sharlene Dory, DO. Call.   Specialty: Family Medicine Why: for post hospital follow up Contact information: [address] [place] [address] [phone]         [name], MD Follow up.   Specialty: Orthopedic Surgery Contact information: [address] [place] [address] [phone]                 Signed: Jacquelynn Cree 06/24/2021, 5:43 PM

## 2021-06-24 NOTE — Progress Notes (Signed)
Inpatient Rehabilitation Discharge Medication Review by a Pharmacist  A complete drug regimen review was completed for this patient to identify any potential clinically significant medication issues.  High Risk Drug Classes Is patient taking? Indication by Medication  Antipsychotic No   Anticoagulant No   Antibiotic Yes Cephalexin: UTI/SSTI treatment  Opioid Yes Oxycodone: PRN pain Tramadol: PRN pain  Antiplatelet Yes Aspirin: CVA prevention  Hypoglycemics/insulin Yes Insulin regular U500: DM control Metformin: DM control  Vasoactive Medication Yes Diltiazem: BP control Isosorbide mononitrate: BP control Valsartan: BP control  Chemotherapy No   Other Yes Acetaminophen: PRN pain control Albuterol: PRN SOB Ascorbic acid: nutrition/wound healing Camphor-menthol: PRN itching Cyclobenzaprine: PRN muscle spasms Diclofenac gel: pain control Diphenhydramine: PRN allergies Epinephrine pen: PRN severe allergic reaction Esomeprazole: PRN heartburn Ezetimibe: hypercholesterolemia treatment Fluticasone nasal: PRN allergies Gabapentin: neuropathy/pain control Hydrocortisone cream: PRN itching Hydroxychloroquine: Sjogren treatment Levocetirizine: allergies treatment Levothyroxine: hypothyroidism treatment Lidocine patch: pain control Pramipexole: PRN RLS symptoms Pregabalin: neuropathy/pain control Promethazine: PRN nausea Tizanidine: muscle spasms treatment Vitamin D: supplementation/nutrition  Zinc sulfate: nutrition/wound healing     Type of Medication Issue Identified Description of Issue Recommendation(s)  Drug Interaction(s) (clinically significant)     Duplicate Therapy     Allergy     No Medication Administration End Date     Incorrect Dose     Additional Drug Therapy Needed     Significant med changes from prior encounter (inform family/care partners about these prior to discharge).    Other       Clinically significant medication issues were identified that  warrant physician communication and completion of prescribed/recommended actions by midnight of the next day:  Yes  Name of provider notified for urgent issues identified: Delle Reining  Provider Method of Notification: in person  Pharmacist comments:  - discussed best antimicrobial coverage for patient for both her UTI and concerns for cellulitis. Will increase cephalexin to 500mg  four times daily until scheduled patient follow-up for I&D. - discussed options for optimal diabetes control after discharge. Patient expressed affordability issues in past with other insulins and have been able to afford Humulin U500. Diabetes controlled PTA (A1c 7.3). Will resume PTA insulin regimen and instruct patient to follow-up with her PCP for dose adjustments.   Time spent performing this drug regimen review (minutes):  20 minutes  Thank you for allowing pharmacy to be a part of this patient's care.  , PharmD Clinical Pharmacist

## 2021-06-24 NOTE — Telephone Encounter (Signed)
Erin responding to Delle Reining, PA through secure chat

## 2021-06-24 NOTE — Telephone Encounter (Signed)
Surgery sheet for add on Wednesday revision

## 2021-06-24 NOTE — Progress Notes (Signed)
Patient discharge from facility with husband. Cletis Media, LPN

## 2021-06-24 NOTE — Progress Notes (Signed)
Inpatient Rehabilitation Care Coordinator Discharge Note   Patient Details  Name: [name] MRN: [id number] Date of Birth: 1960-11-30   Discharge location: Home  Length of Stay: 16 Days  Discharge activity level: CGA/SUP  Home/community participation: spouse  Patient response VV:OHYWVP Literacy - How often do you need to have someone help you when you read instructions, pamphlets, or other written material from your doctor or pharmacy?: Never  Patient response XT:GGYIRS Isolation - How often do you feel lonely or isolated from those around you?: Never  Services provided included: SW, [name], Pharmacy, TR, CM, RN, SLP, OT, PT, RD, MD  Financial Services:  Financial Services Utilized: Private Insurance [employer]  Choices offered to/list presented to: pt  Follow-up services arranged:  Home Health Home Health Agency: [facility]         Patient response to transportation need: Is the patient able to respond to transportation needs?: Yes In the past 12 months, has lack of transportation kept you from medical appointments or from getting medications?: No In the past 12 months, has lack of transportation kept you from meetings, work, or from getting things needed for daily living?: No    Comments (or additional information):  Patient/Family verbalized understanding of follow-up arrangements:  Yes  Individual responsible for coordination of the follow-up plan: self  Confirmed correct DME delivered: Andria Rhein 06/24/2021    Andria Rhein
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
## 2021-06-24 NOTE — Progress Notes (Signed)
Per [place] PDMP patient should have about 84 of oxycodone 10 mg left over since her last refill. She was advised to use oxycodone 20 mg (2 of 10 mg pills) TID prn and to follow up with Dr. Dalene Carrow for taper.  Oxycodone 10 mg #90 filled at discharge and patient should have enough med to cover her for 3+ weeks

## 2021-06-24 NOTE — Telephone Encounter (Signed)
Delle Reining MD called from [facility] stating she need opinion from [place] of images taken of pt and need a call back as soon as possible. Please view images and call Rinaldo Cloud back at [phone].

## 2021-06-24 NOTE — Progress Notes (Signed)
Orthopedic Tech Progress Note Patient Details:  [name] Feb 24, 1961 [id number] Called in order to [employer] for shrinkers Patient ID: [name], female   DOB: May 14, 1961, 60 y.o.   MRN: [id number]  [name] 06/24/2021, 10:37 AM

## 2021-06-27 ENCOUNTER — Encounter (HOSPITAL_COMMUNITY): Payer: Self-pay | Admitting: Orthopedic Surgery

## 2021-06-27 ENCOUNTER — Telehealth: Payer: Self-pay

## 2021-06-27 NOTE — Progress Notes (Signed)
DUE TO COVID-19 ONLY ONE VISITOR IS ALLOWED TO COME WITH YOU AND STAY IN THE WAITING ROOM ONLY DURING PRE OP AND PROCEDURE DAY OF SURGERY.   Two  VISITORS  MAY VISIT WITH YOU AFTER SURGERY IN YOUR PRIVATE ROOM DURING VISITING HOURS ONLY!  PCP - Dr [name] Cardiologist - Dr Armanda Magic  Chest x-ray - 05/26/21 (1V) EKG - 05/26/21 Stress Test - 02/22/21 ECHO TEE - 06/01/21 Cardiac Cath - 04/18/18  ICD Pacemaker/Loop - n/a  Sleep Study -  Yes -02/23/16, Mild CPAP - does not use cpap  Fasting Blood Sugar - 75s-130s Checks Blood Sugar 4-8 times a day  Do not take metformin on the morning of surgery.     THE MORNING OF SURGERY, do not take Humulin R unless you CBG is greater than 220 mg/dL.  If greater than 220 mg/dl. then you may take  of your sliding scale (correction) dose of insulin.  If your blood sugar is less than 70 mg/dL, you will need to treat for low blood sugar: Treat a low blood sugar (less than 70 mg/dL) with  cup of clear juice (cranberry or apple), 4 glucose tablets, OR glucose gel. Recheck blood sugar in 15 minutes after treatment (to make sure it is greater than 70 mg/dL). If your blood sugar is not greater than 70 mg/dL on recheck, call [phone] for further instructions.  ERAS: Clears til 6:30 am on DOS.  Clears reviewed with patient.  Call MD for Aspirin instructions prior to surgery.  Patient agrees to call MD for these instructions.  Anesthesia review: Yes  STOP now taking any Aspirin (unless otherwise instructed by your surgeon), Aleve, Naproxen, Ibuprofen, Motrin, Advil, Goody's, BC's, all herbal medications, fish oil, and all vitamins.   Coronavirus Screening Covid test is scheduled on DOS Do you have any of the following symptoms:  Cough yes/no: No Fever (>100.106F)  yes/no: No Runny nose yes/no: No Sore throat yes/no: No Difficulty breathing/shortness of breath  Yes but not new  Have you traveled in the last 14 days and where? yes/no:  No  Patient verbalized understanding of instructions that were given via phone.

## 2021-06-27 NOTE — Telephone Encounter (Signed)
Transition Care Management Follow-up Telephone Call Date of discharge and from where: 06/24/21 from [facility] How have you been since you were released from the hospital? Pt states that she has been managing ADL's with assistance from her husband, along with use of assisted devices ( walker and wheelchair).  Any questions or concerns? No  Items Reviewed: Did the pt receive and understand the discharge instructions provided? Yes  Medications obtained and verified? Yes  Other? Yes  Any new allergies since your discharge? No  Dietary orders reviewed? Yes Do you have support at home? Yes   Home Care and Equipment/Supplies: Were home health services ordered? yes If so, what is the name of the agency? [facility]  Has the agency set up a time to come to the patient's home? no Were any new equipment or medical supplies ordered?  Yes: , wheelchair and walker What is the name of the medical supply agency? Patient obtained from the hospital. Were you able to get the supplies/equipment? yes Do you have any questions related to the use of the equipment or supplies? No  Functional Questionnaire: (I = Independent and D = Dependent) ADLs: I with assistance  Bathing/Dressing- I with assistance  Meal Prep- D, spouse prepares meals  Eating- I  Maintaining continence- I with assistance  Transferring/Ambulation- I with assistance with wheelchair and/or walker  Managing Meds- I  Follow up appointments reviewed:  PCP Hospital f/u appt confirmed? Yes  Scheduled to see Dr. Carmelia Roller on 07/05/21 @ 11:15am. Specialist Hospital f/u appt confirmed? Yes  Scheduled to see Dr. Carlis Abbott on 07/15/21 @ 1:00pm. Are transportation arrangements needed? No  If their condition worsens, is the pt aware to call PCP or go to the Emergency Dept.? Yes Was the patient provided with contact information for the PCP's office or ED? Yes Was to pt encouraged to call back with questions or concerns? Yes

## 2021-06-28 ENCOUNTER — Other Ambulatory Visit: Payer: Self-pay | Admitting: Family

## 2021-06-28 ENCOUNTER — Ambulatory Visit: Payer: 59 | Admitting: Family

## 2021-06-28 NOTE — Progress Notes (Signed)
Anesthesia Chart Review: Maury Dus  Case: [id number] Date/Time: 06/29/21 0934   Procedure: REVISION RIGHT BELOW KNEE AMPUTATION (Right)   Anesthesia type: Choice   Pre-op diagnosis: Dehiscence/Necrosis Right Below Knee Amputation   [facility]: [facility] ROOM 11 / [facility]   Surgeons: [name], MD       DISCUSSION: Patient is a 60 year old female scheduled for the above procedure.  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  History includes former smoker (quit 02/11/04), HLD, CAD (s/p DES mid & distal RCA 10/01/15 in [facility]; DES ostial RCA 04/18/18 at [facility]), chronic diastolic CHF, murmur (mild-moderate AS 05/29/21 TTE, no AS 06/01/21 TEE), dysrhythmia (PVCs; brief afib with RVR in the setting of sepsis/bacteremia 10/2018, no anticoagulation unless recurrence), HTN, DM2, IBS, NAFLD, GERD, RA, mixed connective tissue disease, asthma, chronic pain syndrome, scoliosis, hypothyroidism, hysterectomy, osteomyelitis (s/p right 4th/5th toe amputation 01/28/20; 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 Very mild OSA by 2017 sleep study. BMI is consistent with morbid obesity.   [facility] admission 05/26/21 for sepsis from Enterococcus faecalis bacteremia secondary to cellulitis and chronic diabetic foot ulcer.  She was started on broad-spectrum antibiotic and underwent right BKA by Dr. Lajoyce Corners on 06/01/21.  TEE done was negative for endocarditis or valvular disease.  She was discharged to [facility] for rehab 06/08/21-06/24/21. Unfortunately, right BKA site was concerning for early ischemia with erythema and dehiscence no responsive to local wound care and antibiotics. Ortho recommended I&D and revision of the right BKA site the following week post discharge.   Other than 06/01/21 TEE per Dr. Flora Lipps, last cardiology visit with Dr. Mayford Knife was on 02/03/24. Non-ischemic stress test 02/22/21, EF 44%. Predominant SR with occasional PVC on June event monitor. Technically difficult TTE 05/29/21 showing  LVEF 55-60% with mild-moderate AS with 06/01/21 TEE showed LVEF 60-65%, no regional wall motion abnormalities, normal RVSP, no LAA thrombus or evidenct of vegetation, trivial MR, mild AV sclerosis but no AS.  She had CBC and BMET on 06/20/21 showin gCr 0.93 and H/H 11.6/38.3. AST 51 and ALT 39 on 06/09/21. Anesthesia team to evaluate on the day of surgery.    VS: Ht 5\' 2"  (1.575 m)   Wt (!) 141.5 kg   BMI 57.07 kg/m  BP Readings from Last 3 Encounters:  06/24/21 (!) 145/77  06/08/21 (!) 114/49  05/02/21 124/62   Pulse Readings from Last 3 Encounters:  06/24/21 93  06/08/21 95  05/02/21 90     PROVIDERS: 05/04/21, DO is PCP  Sharlene Dory, MD is cardiologist Armanda Magic, MD is rheumatologist Hedwig Morton) Smitty Cords, MD is endocrinologist ([facility])   LABS: Most recent lab results include: Lab Results  Component Value Date   WBC 8.6 06/20/2021   HGB 11.6 (L) 06/20/2021   HCT 38.3 06/20/2021   PLT 405 (H) 06/20/2021   GLUCOSE 142 (H) 06/20/2021   ALT 39 06/09/2021   AST 51 (H) 06/09/2021   NA 135 06/20/2021   K 4.3 06/20/2021   CL 97 (L) 06/20/2021   CREATININE 0.93 06/20/2021   BUN 15 06/20/2021   CO2 26 06/20/2021   INR 1.1 05/26/2021   HGBA1C 7.3 (H) 05/27/2021    OTHER: Spirometry 10/30/16: FVC 2.43 (77%), FEV1 1.82 (73%), FEV1R 0.75 (95%), FEF 25-75 1.36 (56%).  Sleep study 02/13/16 (found under Media tab): IMPRESSIONS - Mild obstructive sleep apnea occurred during this study (AHI = 5.9/h). - No significant central sleep apnea occurred during  this study (CAI = 0.0/h). - Mild oxygen desaturation was noted during this study (Min O2 = 86.00%). - The patient snored with Soft snoring volume. - No cardiac abnormalities were noted during this study. - Mild periodic limb movements of sleep occurred during the study. No significant associated arousals. RECOMMENDATIONS - Positional therapy avoiding supine position during sleep. - Very mild obstructive  sleep apnea. Return to discuss treatment options...   IMAGES: 1V PCXR 05/26/21: FINDINGS: Single frontal view of the chest demonstrates a stable cardiac silhouette. Chronic central vascular congestion without airspace disease, effusion, or pneumothorax. Azygos fissure again noted. No acute bony abnormalities. IMPRESSION: 1. Chronic central vascular congestion.  No acute airspace disease.   EKG: 05/26/21: Sinus tachycardia at 120 bpm LAD, consider LAFB or inferior infarct Anterior infarct, old Minimal ST elevation, lateral leads When compared to prior, similar tachycardia with less PVC and less wandering baseline. No STEMI Confirmed by Theda Belfast ([id number]) on 05/26/2021 5:51:56 PM   CV: TEE 06/01/21: IMPRESSIONS   1. Left ventricular ejection fraction, by estimation, is 60 to 65%. The  left ventricle has normal function. The left ventricle has no regional  wall motion abnormalities.   2. Right ventricular systolic function is normal. The right ventricular  size is normal.   3. No left atrial/left atrial appendage thrombus was detected.   4. The mitral valve is grossly normal. Trivial mitral valve  regurgitation. No evidence of mitral stenosis.   5. The aortic valve is tricuspid. Aortic valve regurgitation is not  visualized. Mild aortic valve sclerosis is present, with no evidence of  aortic valve stenosis.   6. There is mild (Grade II) layered plaque involving the descending aorta  and transverse aorta.  - Conclusion(s)/Recommendation(s): No evidence of vegetation/infective  endocarditis on this transesophageal  echocardiogram.    Echo/TTE 05/29/21: Sonographer Comments: Technically challenging study due to limited  acoustic windows, Technically difficult study due to poor echo windows,  suboptimal parasternal window, suboptimal apical window, suboptimal  subcostal window and patient is morbidly obese.  Image acquisition challenging due to patient body habitus.   IMPRESSIONS   1. An aortic valve gradient was measured. The valve cannot be seen well  enough to make any further comments.   2. Technically difficult echo with poor image quality. Using Definity  contrast, the LV systolic function can be seen to be well preserved.   3. Left ventricular ejection fraction, by estimation, is 55 to 60%. The  left ventricle has normal function. Left ventricular endocardial border  not optimally defined to evaluate regional wall motion. Left ventricular  diastolic function could not be  evaluated.   4. Right ventricular systolic function was not well visualized. The right  ventricular size is not well visualized.   5. The mitral valve was not well visualized. No evidence of mitral valve  regurgitation.   6. The aortic valve was not well visualized. Aortic valve regurgitation  is not visualized. Mild to moderate aortic valve stenosis.    Cardiac Event Monitor 02/15/21-03/12/21: Predominant rhythm was normal sinus rhythm with average heart rate 90bpm and ranged from 74 to 132bpm Occasionaly PVCs  Nuclear stress 02/22/21: Nuclear stress EF: 44%. Visually, the LV EF appears to be greater than the computer calculated 44% and appears normal . There was no ST segment deviation noted during stress. This is a low risk study. There is no evidence of ischemia and no evidence of previous infarction The study is normal.    Cath and PCI 04/18/18: 1.  Severe stenosis ostial RCA. 2. Patent stents mid and distal RCA with minimal restenosis.  3. Mild non-obstructive disease in the LAD and Circumflex 4. Successful PTCA/DES x 1 ostial RCA   Recommendations:  Recommend uninterrupted dual antiplatelet therapy with Aspirin 81mg  daily and Clopidogrel 75mg  daily for a minimum of 6 months (stable ischemic heart disease - Class I recommendation). If she tolerates, DAPT, would continue for one year.     Past Medical History:  Diagnosis Date   Aortic stenosis    mild AS by echo  02/2021   Asthma    "on daily RX and rescue inhaler" (04/18/2018)   Chronic diastolic CHF (congestive heart failure) (HCC) 09/2015   Chronic lower back pain    Chronic neck pain    Chronic pain syndrome    Fentanyl Patch   Colon polyps    Coronary artery disease    cath with normal LM, 30% LAD, 85% mid RCA and 95% distal RCA s/p PCI of the mid to distal RCA and now on DAPT with ASA and Ticagrelor.     Eczema    Excessive daytime sleepiness 11/26/2015   Fibromyalgia    Gallstones    GERD (gastroesophageal reflux disease)    Heart murmur    "noted for the 1st time on 04/18/2018"   History of blood transfusion 07/2010   "S/P oophorectomy"   History of gout    History of hiatal hernia 1980s   "gone now" (04/18/2018)   Hyperlipidemia    Hypertension    takes Metoprolol and Enalapril daily   Hypothyroidism    takes Synthroid daily   IBS (irritable bowel syndrome)    Migraine    "nothing in the 2000s" (04/18/2018)   Mixed connective tissue disease (HCC)    NAFLD (nonalcoholic fatty liver disease)    Pneumonia    "several times" (04/18/2018)   PVC's (premature ventricular contractions)    noted on event monitor 02/2021   Rheumatoid arthritis (HCC)    "hands, elbows, shoulders, probably knees" (04/18/2018)   Scoliosis    Sleep apnea    mild - does not use cpap   Spondylosis    Type II diabetes mellitus (HCC)    takes Metformin and Hum R daily (04/18/2018)   Walker as ambulation aid    also uses wheelchair    Past Surgical History:  Procedure Laterality Date   ABDOMINAL HYSTERECTOMY  06/2005   "w/right ovariy"   AMPUTATION Right 01/28/2020    RIGHT FOURTH AND FIFTH RAY AMPUTATION   AMPUTATION Right 01/28/2020   Procedure: RIGHT FOURTH AND FIFTH RAY AMPUTATION,;  Surgeon: 01/30/2020, MD;  Location: [facility];  Service: Orthopedics;  Laterality: Right;   AMPUTATION Right 06/01/2021   Procedure: RIGHT BELOW KNEE AMPUTATION;  Surgeon: [name], MD;  Location: [facility] OR;  Service:  Orthopedics;  Laterality: Right;   APPENDECTOMY     BREAST BIOPSY Bilateral    9 total (04/18/2018)   CORONARY ANGIOPLASTY WITH STENT PLACEMENT  10/01/2015   normal LM, 30% LAD, 85% mid RCA and 95% distal RCA s/p PCI of the mid to distal RCA and now on DAPT with ASA and Ticagrelor.     CORONARY STENT INTERVENTION Right 04/18/2018   Procedure: CORONARY STENT INTERVENTION;  Surgeon: 10/03/2015, MD;  Location: [facility] INVASIVE CV LAB;  Service: Cardiovascular;  Laterality: Right;   DILATION AND CURETTAGE OF UTERUS     FRACTURE SURGERY     LAPAROSCOPIC CHOLECYSTECTOMY  LEFT HEART CATH AND CORONARY ANGIOGRAPHY N/A 04/18/2018   Procedure: LEFT HEART CATH AND CORONARY ANGIOGRAPHY;  Surgeon: [name], MD;  [facility]: [facility] CV [facility];  Service: Cardiovascular;  Laterality: N/A;   MUSCLE BIOPSY Left    "leg"   OOPHORECTOMY  07/2010   RADIOLOGY WITH ANESTHESIA N/A 05/25/2016   Procedure: RADIOLOGY WITH ANESTHESIA;  Surgeon: Medication Radiologist, MD;  Location: [facility] OR;  Service: Radiology;  Laterality: N/A;   TEE WITHOUT CARDIOVERSION N/A 06/01/2021   Procedure: TRANSESOPHAGEAL ECHOCARDIOGRAM (TEE);  Surgeon: Sande Rives, MD;  Location: [facility] OR;  Service: Cardiovascular;  Laterality: N/A;   TONSILLECTOMY AND ADENOIDECTOMY     WRIST FRACTURE SURGERY Left    "crushed it"    MEDICATIONS: No current facility-administered medications for this encounter.    acetaminophen (TYLENOL) 500 MG tablet   Acetylcysteine, Nutrient, (N-ACETYL CYSTEINE) 600 MG TABS   albuterol (PROVENTIL) (2.5 MG/3ML) 0.083% nebulizer solution   ascorbic acid (VITAMIN C) 500 MG tablet   aspirin EC 81 MG tablet   betamethasone dipropionate 0.05 % cream   cephALEXin (KEFLEX) 500 MG capsule   collagenase (SANTYL) ointment   cyclobenzaprine (FLEXERIL) 10 MG tablet   diclofenac Sodium (VOLTAREN) 1 % GEL   diltiazem (CARDIZEM CD) 240 MG 24 hr capsule   diphenhydrAMINE (BENADRYL) 25 MG tablet    EPINEPHrine (EPIPEN 2-PAK) 0.3 mg/0.3 mL IJ SOAJ injection   esomeprazole (NEXIUM) 20 MG capsule   ezetimibe (ZETIA) 10 MG tablet   fluconazole (DIFLUCAN) 150 MG tablet   fluticasone (FLONASE) 50 MCG/ACT nasal spray   folic acid (FOLVITE) 800 MCG tablet   furosemide (LASIX) 20 MG tablet   gabapentin (NEURONTIN) 400 MG capsule   GLUCOMANNAN PO   hydrocortisone cream 1 %   hydroxychloroquine (PLAQUENIL) 200 MG tablet   insulin regular human CONCENTRATED (HUMULIN R) 500 UNIT/ML injection   isosorbide mononitrate (IMDUR) 30 MG 24 hr tablet   levocetirizine (XYZAL) 5 MG tablet   levothyroxine (SYNTHROID) 200 MCG tablet   metFORMIN (GLUCOPHAGE-XR) 500 MG 24 hr tablet   methocarbamol (ROBAXIN) 750 MG tablet   mupirocin ointment (BACTROBAN) 2 %   naloxone (NARCAN) nasal spray 4 mg/0.1 mL   nitroGLYCERIN (NITROSTAT) 0.4 MG SL tablet   Oxycodone HCl 10 MG TABS   oxymetazoline (AFRIN) 0.05 % nasal spray   POT BICARB-POT CHLORIDE,25MEQ, 25 MEQ TBEF   pramipexole (MIRAPEX) 1 MG tablet   pregabalin (LYRICA) 25 MG capsule   promethazine (PHENERGAN) 25 MG tablet   saccharomyces boulardii (FLORASTOR) 250 MG capsule   tiZANidine (ZANAFLEX) 2 MG tablet   traMADol (ULTRAM) 50 MG tablet   valsartan (DIOVAN) 160 MG tablet   vitamin B-12 (CYANOCOBALAMIN) 1000 MCG tablet   Vitamin D, Ergocalciferol, (DRISDOL) 1.25 MG (50000 UNIT) CAPS capsule   Zinc Sulfate 220 (50 Zn) MG TABS   camphor-menthol (SARNA) lotion   glucose blood test strip   INSULIN SYRINGE 1CC/29G (B-D INSULIN SYRINGE) 29G X 1/2" 1 ML MISC   lidocaine (LIDODERM) 5 %    Shonna Chock, PA-C Surgical Short Stay/Anesthesiology [facility] Phone [phone] [facility] Phone [phone] 06/28/2021 12:10 PM

## 2021-06-28 NOTE — Consult Note (Signed)
The patient is a 60 year old woman seen today in consultation.  She is scheduled to be discharged from inpatient rehab today.  She has been taking Keflex for urinary tract infection.  Unfortunately she did have a fall onto her residual limb over 2 weeks ago she has seen progressive breakdown of the wound since then.  It has become been becoming progressively more red and swollen as well.  Today complaining of tightness in her residual limb she is sitting on the edge of the bed there is weeping serous drainage dripping onto the floor.  Denies any systemic symptoms  On examination of the right residual limb the staples are in place there is some dehiscence about 2 cm wide.  Please see attached photos.  There is eschar in the wound bed and 50% fibrinous exudative tissue.  Serous drainage minimal there is surrounding erythema and induration.  There is no ascending cellulitis there is very mild warmth.  There is no purulence.  Discussed case with patient and Delle Reining at the bedside.  Concern for progressive necrosis and dehiscence of her incision.  Concern for worsening of her cellulitis.  We will plan for revision of her right below-knee amputation on Wednesday.  Patient is in agreement with the plan. She will continue on her Keflex.  Discussed direct return precautions over the weekend she will continue with dry dressing changes and close monitoring of her wound

## 2021-06-28 NOTE — Anesthesia Preprocedure Evaluation (Addendum)
Anesthesia Evaluation  Patient identified by MRN, date of birth, ID band Patient awake    Reviewed: Allergy & Precautions, NPO status , Patient's Chart, lab work & pertinent test results  History of Anesthesia Complications Negative for: history of anesthetic complications  Airway Mallampati: III  TM Distance: >3 FB Neck ROM: Full    Dental  (+) Poor Dentition, Chipped, Missing,    Pulmonary asthma , sleep apnea , former smoker,    breath sounds clear to auscultation       Cardiovascular hypertension, + CAD and +CHF  + Valvular Problems/Murmurs  Rhythm:Regular  1. Left ventricular ejection fraction, by estimation, is 60 to 65%. The  left ventricle has normal function. The left ventricle has no regional  wall motion abnormalities.  2. Right ventricular systolic function is normal. The right ventricular  size is normal.  3. No left atrial/left atrial appendage thrombus was detected.  4. The mitral valve is grossly normal. Trivial mitral valve  regurgitation. No evidence of mitral stenosis.  5. The aortic valve is tricuspid. Aortic valve regurgitation is not  visualized. Mild aortic valve sclerosis is present, with no evidence of  aortic valve stenosis.  6. There is mild (Grade II) layered plaque involving the descending aorta  and transverse aorta.    ? Nuclear stress EF: 44%. Visually, the LV EF appears to be greater than the computer calculated 44% and appears normal . ? There was no ST segment deviation noted during stress. ? This is a low risk study. There is no evidence of ischemia and no evidence of previous infarction ? The study is normal.   cath with normal LM, 30% LAD, 85% mid RCA and 95% distal RCA s/p PCI of the mid to distal RCA and now on DAPT with ASA and    Neuro/Psych  Headaches,  Neuromuscular disease    GI/Hepatic Neg liver ROS, hiatal hernia, GERD  ,  Endo/Other  diabetes, Insulin  DependentHypothyroidism Morbid obesity  Renal/GU negative Renal ROSLab Results      Component                Value               Date                      CREATININE               0.93                06/20/2021                Musculoskeletal  (+) Arthritis , Fibromyalgia -  Abdominal   Peds  Hematology  (+) Blood dyscrasia, anemia , Lab Results      Component                Value               Date                      WBC                      8.6                 06/20/2021                HGB  11.6 (L)            06/20/2021                HCT                      38.3                06/20/2021                MCV                      83.1                06/20/2021                PLT                      405 (H)             06/20/2021              Anesthesia Other Findings Patient is a 59 year old female scheduled for the above procedure.  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  History includes former smoker (quit 02/11/04), HLD, CAD (s/p DES mid & distal RCA 10/01/15 in [facility]; DES ostial RCA 04/18/18 at [facility]), chronic diastolic CHF, murmur (mild-moderate AS 05/29/21 TTE, no AS 06/01/21 TEE), dysrhythmia (PVCs; brief afib with RVR in the setting of sepsis/bacteremia 10/2018, no anticoagulation unless recurrence), HTN, DM2, IBS, NAFLD, GERD, RA, mixed connective tissue disease, asthma, chronic pain syndrome, scoliosis, hypothyroidism, hysterectomy, osteomyelitis (s/p right 4th/5th toe amputation 01/28/20; 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 Very mild OSA by 2017 sleep study. BMI is consistent with morbid obesity.   [name] admission 05/26/21 for sepsis from Enterococcus faecalis bacteremia secondary to cellulitis and chronic diabetic foot ulcer. She was started on broad-spectrum antibiotic and underwent right BKA by Dr. Lajoyce Corners on 06/01/21. TEE done was negative for endocarditis or valvular disease. She was discharged to [facility]  for rehab 06/08/21-06/24/21. Unfortunately, right BKA site was concerning for early ischemia with erythema and dehiscence no responsive to local wound care and antibiotics. Ortho recommended I&D and revision of the right BKA site the following week post discharge.  Other than 06/01/21 TEE per Dr. Flora Lipps, last cardiology visit with Dr. Mayford Knife was on 02/03/24. Non-ischemic stress test 02/22/21, EF 44%. Predominant SR with occasional PVC on June event monitor. Technically difficult TTE 05/29/21 showing LVEF 55-60% with mild-moderate AS with 06/01/21 TEE showed LVEF 60-65%, no regional wall motion abnormalities, normal RVSP, no LAA thrombus or evidenct of vegetation, trivial MR, mild AV sclerosis but no AS.  She had CBC and BMET on 06/20/21 showin gCr 0.93 and H/H 11.6/38.3. AST 51 and ALT 39 on 06/09/21. Anesthesia team to evaluate on the day of surgery.   Reproductive/Obstetrics                          Anesthesia Physical Anesthesia Plan  ASA: 4  Anesthesia Plan: General   Post-op Pain Management:    Induction: Intravenous  PONV Risk Score and Plan: 3 and Ondansetron and Treatment may vary due to age or medical condition  Airway Management Planned: LMA  Additional Equipment: None  Intra-op Plan:   Post-operative Plan: Extubation in OR  Informed Consent: I have reviewed the patients History and Physical, chart, labs and  discussed the procedure including the risks, benefits and alternatives for the proposed anesthesia with the patient or authorized representative who has indicated his/her understanding and acceptance.     Dental advisory given  Plan Discussed with: CRNA and Anesthesiologist  Anesthesia Plan Comments: (PAT note written 06/28/2021 by Shonna Chock, PA-C. )       Anesthesia Quick Evaluation

## 2021-06-29 ENCOUNTER — Inpatient Hospital Stay (HOSPITAL_COMMUNITY)
Admission: AD | Admit: 2021-06-29 | Discharge: 2021-07-01 | DRG: 475 | Disposition: A | Discharge status: Home Health | Payer: 59 | Attending: Orthopedic Surgery | Admitting: Orthopedic Surgery

## 2021-06-29 ENCOUNTER — Encounter (HOSPITAL_COMMUNITY): Payer: Self-pay | Admitting: Orthopedic Surgery

## 2021-06-29 ENCOUNTER — Encounter (HOSPITAL_COMMUNITY)
Admission: AD | Disposition: A | Discharge status: Home Health | Payer: Self-pay | Source: Home / Self Care | Attending: Orthopedic Surgery

## 2021-06-29 ENCOUNTER — Ambulatory Visit (HOSPITAL_COMMUNITY): Payer: 59 | Admitting: Vascular Surgery

## 2021-06-29 ENCOUNTER — Other Ambulatory Visit: Payer: Self-pay

## 2021-06-29 DIAGNOSIS — E785 Hyperlipidemia, unspecified: Secondary | ICD-10-CM | POA: Diagnosis present

## 2021-06-29 DIAGNOSIS — M545 Low back pain, unspecified: Secondary | ICD-10-CM | POA: Diagnosis present

## 2021-06-29 DIAGNOSIS — Z91048 Other nonmedicinal substance allergy status: Secondary | ICD-10-CM

## 2021-06-29 DIAGNOSIS — E039 Hypothyroidism, unspecified: Secondary | ICD-10-CM | POA: Diagnosis present

## 2021-06-29 DIAGNOSIS — Z9049 Acquired absence of other specified parts of digestive tract: Secondary | ICD-10-CM

## 2021-06-29 DIAGNOSIS — M419 Scoliosis, unspecified: Secondary | ICD-10-CM | POA: Diagnosis present

## 2021-06-29 DIAGNOSIS — M351 Other overlap syndromes: Secondary | ICD-10-CM | POA: Diagnosis present

## 2021-06-29 DIAGNOSIS — Z20822 Contact with and (suspected) exposure to covid-19: Secondary | ICD-10-CM | POA: Diagnosis present

## 2021-06-29 DIAGNOSIS — M109 Gout, unspecified: Secondary | ICD-10-CM | POA: Diagnosis present

## 2021-06-29 DIAGNOSIS — K589 Irritable bowel syndrome without diarrhea: Secondary | ICD-10-CM | POA: Diagnosis present

## 2021-06-29 DIAGNOSIS — Y835 Amputation of limb(s) as the cause of abnormal reaction of the patient, or of later complication, without mention of misadventure at the time of the procedure: Secondary | ICD-10-CM | POA: Diagnosis present

## 2021-06-29 DIAGNOSIS — E119 Type 2 diabetes mellitus without complications: Secondary | ICD-10-CM | POA: Diagnosis present

## 2021-06-29 DIAGNOSIS — K219 Gastro-esophageal reflux disease without esophagitis: Secondary | ICD-10-CM | POA: Diagnosis present

## 2021-06-29 DIAGNOSIS — Z955 Presence of coronary angioplasty implant and graft: Secondary | ICD-10-CM

## 2021-06-29 DIAGNOSIS — T8130XA Disruption of wound, unspecified, initial encounter: Secondary | ICD-10-CM | POA: Diagnosis present

## 2021-06-29 DIAGNOSIS — M479 Spondylosis, unspecified: Secondary | ICD-10-CM | POA: Diagnosis present

## 2021-06-29 DIAGNOSIS — I110 Hypertensive heart disease with heart failure: Secondary | ICD-10-CM | POA: Diagnosis present

## 2021-06-29 DIAGNOSIS — Z7984 Long term (current) use of oral hypoglycemic drugs: Secondary | ICD-10-CM

## 2021-06-29 DIAGNOSIS — G4730 Sleep apnea, unspecified: Secondary | ICD-10-CM | POA: Diagnosis present

## 2021-06-29 DIAGNOSIS — Z794 Long term (current) use of insulin: Secondary | ICD-10-CM

## 2021-06-29 DIAGNOSIS — J45909 Unspecified asthma, uncomplicated: Secondary | ICD-10-CM | POA: Diagnosis present

## 2021-06-29 DIAGNOSIS — I350 Nonrheumatic aortic (valve) stenosis: Secondary | ICD-10-CM | POA: Diagnosis present

## 2021-06-29 DIAGNOSIS — I2510 Atherosclerotic heart disease of native coronary artery without angina pectoris: Secondary | ICD-10-CM | POA: Diagnosis present

## 2021-06-29 DIAGNOSIS — Z79899 Other long term (current) drug therapy: Secondary | ICD-10-CM

## 2021-06-29 DIAGNOSIS — T8781 Dehiscence of amputation stump: Secondary | ICD-10-CM | POA: Diagnosis present

## 2021-06-29 DIAGNOSIS — Z8719 Personal history of other diseases of the digestive system: Secondary | ICD-10-CM

## 2021-06-29 DIAGNOSIS — G894 Chronic pain syndrome: Secondary | ICD-10-CM | POA: Diagnosis present

## 2021-06-29 DIAGNOSIS — K760 Fatty (change of) liver, not elsewhere classified: Secondary | ICD-10-CM | POA: Diagnosis present

## 2021-06-29 DIAGNOSIS — M542 Cervicalgia: Secondary | ICD-10-CM | POA: Diagnosis present

## 2021-06-29 DIAGNOSIS — Z8249 Family history of ischemic heart disease and other diseases of the circulatory system: Secondary | ICD-10-CM

## 2021-06-29 DIAGNOSIS — W19XXXA Unspecified fall, initial encounter: Secondary | ICD-10-CM | POA: Diagnosis present

## 2021-06-29 DIAGNOSIS — Z91013 Allergy to seafood: Secondary | ICD-10-CM

## 2021-06-29 DIAGNOSIS — Z825 Family history of asthma and other chronic lower respiratory diseases: Secondary | ICD-10-CM

## 2021-06-29 DIAGNOSIS — M797 Fibromyalgia: Secondary | ICD-10-CM | POA: Diagnosis present

## 2021-06-29 DIAGNOSIS — M069 Rheumatoid arthritis, unspecified: Secondary | ICD-10-CM | POA: Diagnosis present

## 2021-06-29 DIAGNOSIS — Z888 Allergy status to other drugs, medicaments and biological substances status: Secondary | ICD-10-CM

## 2021-06-29 DIAGNOSIS — I5032 Chronic diastolic (congestive) heart failure: Secondary | ICD-10-CM | POA: Diagnosis present

## 2021-06-29 DIAGNOSIS — Z89511 Acquired absence of right leg below knee: Secondary | ICD-10-CM

## 2021-06-29 DIAGNOSIS — G43909 Migraine, unspecified, not intractable, without status migrainosus: Secondary | ICD-10-CM | POA: Diagnosis present

## 2021-06-29 DIAGNOSIS — Z7982 Long term (current) use of aspirin: Secondary | ICD-10-CM

## 2021-06-29 DIAGNOSIS — Z87891 Personal history of nicotine dependence: Secondary | ICD-10-CM

## 2021-06-29 DIAGNOSIS — Z79890 Hormone replacement therapy (postmenopausal): Secondary | ICD-10-CM

## 2021-06-29 HISTORY — DX: Sleep apnea, unspecified: G47.30

## 2021-06-29 HISTORY — PX: STUMP REVISION: SHX6102

## 2021-06-29 HISTORY — DX: Dependence on other enabling machines and devices: Z99.89

## 2021-06-29 LAB — SARS CORONAVIRUS 2 BY RT PCR (HOSPITAL ORDER, PERFORMED IN ~~LOC~~ HOSPITAL LAB): SARS Coronavirus 2: NEGATIVE

## 2021-06-29 LAB — GLUCOSE, CAPILLARY
Glucose-Capillary: 244 mg/dL — ABNORMAL HIGH (ref 70–99)
Glucose-Capillary: 223 mg/dL — ABNORMAL HIGH (ref 70–99)
Glucose-Capillary: 168 mg/dL — ABNORMAL HIGH (ref 70–99)
Glucose-Capillary: 330 mg/dL — ABNORMAL HIGH (ref 70–99)
Glucose-Capillary: 292 mg/dL — ABNORMAL HIGH (ref 70–99)

## 2021-06-29 LAB — SURGICAL PCR SCREEN
MRSA, PCR: POSITIVE — AB
Staphylococcus aureus: POSITIVE — AB

## 2021-06-29 SURGERY — REVISION, AMPUTATION SITE
Anesthesia: General | Site: Leg Lower | Laterality: Right

## 2021-06-29 MED ORDER — PHENYLEPHRINE 40 MCG/ML (10ML) SYRINGE FOR IV PUSH (FOR BLOOD PRESSURE SUPPORT)
PREFILLED_SYRINGE | INTRAVENOUS | Status: DC | PRN
  Administered 2021-06-29: 120 ug via INTRAVENOUS
  Administered 2021-06-29 (×2): 80 ug via INTRAVENOUS
  Administered 2021-06-29: 120 ug via INTRAVENOUS
  Administered 2021-06-29: 80 ug via INTRAVENOUS
  Administered 2021-06-29: 120 ug via INTRAVENOUS
  Administered 2021-06-29: 80 ug via INTRAVENOUS

## 2021-06-29 MED ORDER — METFORMIN HCL ER 500 MG PO TB24
1000.0000 mg | ORAL_TABLET | Freq: Two times a day (BID) | ORAL | Status: DC
  Administered 2021-06-29 – 2021-07-01 (×4): 1000 mg via ORAL
  Filled 2021-06-29 (×4): qty 2

## 2021-06-29 MED ORDER — HYDROMORPHONE HCL 1 MG/ML IJ SOLN
INTRAMUSCULAR | Status: AC
  Filled 2021-06-29: qty 0.5

## 2021-06-29 MED ORDER — HYDROXYCHLOROQUINE SULFATE 200 MG PO TABS
200.0000 mg | ORAL_TABLET | Freq: Two times a day (BID) | ORAL | Status: DC
  Administered 2021-06-29 – 2021-07-01 (×5): 200 mg via ORAL
  Filled 2021-06-29 (×6): qty 1

## 2021-06-29 MED ORDER — FENTANYL CITRATE (PF) 100 MCG/2ML IJ SOLN
INTRAMUSCULAR | Status: AC
  Filled 2021-06-29: qty 2

## 2021-06-29 MED ORDER — CEFAZOLIN IN SODIUM CHLORIDE 3-0.9 GM/100ML-% IV SOLN
3.0000 g | INTRAVENOUS | Status: AC
  Administered 2021-06-29: 3 g via INTRAVENOUS

## 2021-06-29 MED ORDER — LACTATED RINGERS IV SOLN: INTRAVENOUS | Status: DC

## 2021-06-29 MED ORDER — IRBESARTAN 75 MG PO TABS
37.5000 mg | ORAL_TABLET | Freq: Every day | ORAL | Status: DC
  Administered 2021-06-29 – 2021-06-30 (×2): 37.5 mg via ORAL
  Filled 2021-06-29 (×2): qty 0.5

## 2021-06-29 MED ORDER — DOCUSATE SODIUM 100 MG PO CAPS
100.0000 mg | ORAL_CAPSULE | Freq: Every day | ORAL | Status: DC
  Administered 2021-06-30: 100 mg via ORAL
  Filled 2021-06-29 (×3): qty 1

## 2021-06-29 MED ORDER — EPHEDRINE SULFATE-NACL 50-0.9 MG/10ML-% IV SOSY
PREFILLED_SYRINGE | INTRAVENOUS | Status: DC | PRN
  Administered 2021-06-29: 10 mg via INTRAVENOUS

## 2021-06-29 MED ORDER — FENTANYL CITRATE (PF) 100 MCG/2ML IJ SOLN
25.0000 ug | INTRAMUSCULAR | Status: DC | PRN
  Administered 2021-06-29 (×3): 25 ug via INTRAVENOUS
  Administered 2021-06-29: 50 ug via INTRAVENOUS
  Administered 2021-06-29: 25 ug via INTRAVENOUS

## 2021-06-29 MED ORDER — HYDRALAZINE HCL 20 MG/ML IJ SOLN: 5.0000 mg | INTRAMUSCULAR | Status: DC | PRN

## 2021-06-29 MED ORDER — METOPROLOL TARTRATE 5 MG/5ML IV SOLN: 2.0000 mg | INTRAVENOUS | Status: DC | PRN

## 2021-06-29 MED ORDER — ASPIRIN EC 81 MG PO TBEC
81.0000 mg | DELAYED_RELEASE_TABLET | Freq: Every evening | ORAL | Status: DC
  Administered 2021-06-29 – 2021-06-30 (×2): 81 mg via ORAL
  Filled 2021-06-29 (×2): qty 1

## 2021-06-29 MED ORDER — 0.9 % SODIUM CHLORIDE (POUR BTL) OPTIME
TOPICAL | Status: DC | PRN
  Administered 2021-06-29: 1000 mL

## 2021-06-29 MED ORDER — DEXAMETHASONE SODIUM PHOSPHATE 10 MG/ML IJ SOLN
INTRAMUSCULAR | Status: AC
  Filled 2021-06-29: qty 1

## 2021-06-29 MED ORDER — ACETAMINOPHEN 160 MG/5ML PO SOLN: 1000.0000 mg | Freq: Once | ORAL | Status: DC | PRN

## 2021-06-29 MED ORDER — ORAL CARE MOUTH RINSE: 15.0000 mL | Freq: Once | OROMUCOSAL | Status: AC

## 2021-06-29 MED ORDER — CHLORHEXIDINE GLUCONATE 0.12 % MT SOLN
15.0000 mL | Freq: Once | OROMUCOSAL | Status: AC
  Administered 2021-06-29: 15 mL via OROMUCOSAL
  Filled 2021-06-29: qty 15

## 2021-06-29 MED ORDER — PROPOFOL 10 MG/ML IV BOLUS
INTRAVENOUS | Status: DC | PRN
  Administered 2021-06-29: 140 mg via INTRAVENOUS

## 2021-06-29 MED ORDER — CEFAZOLIN SODIUM 1 G IJ SOLR
INTRAMUSCULAR | Status: AC
  Filled 2021-06-29: qty 10

## 2021-06-29 MED ORDER — MAGNESIUM SULFATE 2 GM/50ML IV SOLN
2.0000 g | Freq: Every day | INTRAVENOUS | Status: DC | PRN
  Filled 2021-06-29: qty 50

## 2021-06-29 MED ORDER — FENTANYL CITRATE (PF) 100 MCG/2ML IJ SOLN
INTRAMUSCULAR | Status: DC | PRN
  Administered 2021-06-29: 75 ug via INTRAVENOUS
  Administered 2021-06-29: 25 ug via INTRAVENOUS
  Administered 2021-06-29 (×3): 50 ug via INTRAVENOUS

## 2021-06-29 MED ORDER — MIDAZOLAM HCL 5 MG/5ML IJ SOLN
INTRAMUSCULAR | Status: DC | PRN
Start: 2021-06-29 — End: 2021-06-29
  Administered 2021-06-29: 2 mg via INTRAVENOUS

## 2021-06-29 MED ORDER — ACETAMINOPHEN 10 MG/ML IV SOLN
INTRAVENOUS | Status: AC
  Filled 2021-06-29: qty 100

## 2021-06-29 MED ORDER — ALBUTEROL SULFATE (2.5 MG/3ML) 0.083% IN NEBU: 2.5000 mg | INHALATION_SOLUTION | RESPIRATORY_TRACT | Status: DC | PRN

## 2021-06-29 MED ORDER — ACETAMINOPHEN 10 MG/ML IV SOLN
1000.0000 mg | Freq: Once | INTRAVENOUS | Status: DC | PRN
  Administered 2021-06-29: 1000 mg via INTRAVENOUS

## 2021-06-29 MED ORDER — JUVEN PO PACK
1.0000 | PACK | Freq: Two times a day (BID) | ORAL | Status: DC
  Administered 2021-06-29: 1 via ORAL
  Filled 2021-06-29 (×2): qty 1

## 2021-06-29 MED ORDER — PHENOL 1.4 % MT LIQD: 1.0000 | OROMUCOSAL | Status: DC | PRN

## 2021-06-29 MED ORDER — DEXAMETHASONE SODIUM PHOSPHATE 10 MG/ML IJ SOLN
INTRAMUSCULAR | Status: DC | PRN
Start: 2021-06-29 — End: 2021-06-29
  Administered 2021-06-29: 5 mg via INTRAVENOUS

## 2021-06-29 MED ORDER — LABETALOL HCL 5 MG/ML IV SOLN: 10.0000 mg | INTRAVENOUS | Status: DC | PRN

## 2021-06-29 MED ORDER — FENTANYL CITRATE (PF) 250 MCG/5ML IJ SOLN
INTRAMUSCULAR | Status: AC
  Filled 2021-06-29: qty 5

## 2021-06-29 MED ORDER — DILTIAZEM HCL ER COATED BEADS 120 MG PO CP24
240.0000 mg | ORAL_CAPSULE | Freq: Every day | ORAL | Status: DC
  Administered 2021-06-29 – 2021-07-01 (×3): 240 mg via ORAL
  Filled 2021-06-29 (×3): qty 2

## 2021-06-29 MED ORDER — LEVOTHYROXINE SODIUM 100 MCG PO TABS
200.0000 ug | ORAL_TABLET | Freq: Every day | ORAL | Status: DC
  Administered 2021-06-30 – 2021-07-01 (×2): 200 ug via ORAL
  Filled 2021-06-29 (×2): qty 2

## 2021-06-29 MED ORDER — ZINC SULFATE 220 (50 ZN) MG PO CAPS
220.0000 mg | ORAL_CAPSULE | Freq: Every day | ORAL | Status: DC
  Administered 2021-06-29 – 2021-07-01 (×3): 220 mg via ORAL
  Filled 2021-06-29 (×4): qty 1

## 2021-06-29 MED ORDER — MIDAZOLAM HCL 2 MG/2ML IJ SOLN
INTRAMUSCULAR | Status: AC
  Filled 2021-06-29: qty 2

## 2021-06-29 MED ORDER — CHLORHEXIDINE GLUCONATE 4 % EX LIQD: 60.0000 mL | Freq: Once | CUTANEOUS | Status: DC

## 2021-06-29 MED ORDER — HYDROMORPHONE HCL 1 MG/ML IJ SOLN
0.5000 mg | INTRAMUSCULAR | Status: DC | PRN
  Administered 2021-06-29 – 2021-06-30 (×4): 1 mg via INTRAVENOUS
  Filled 2021-06-29 (×4): qty 1

## 2021-06-29 MED ORDER — MUPIROCIN 2 % EX OINT
1.0000 "application " | TOPICAL_OINTMENT | Freq: Once | CUTANEOUS | Status: AC
  Administered 2021-06-29: 1 via TOPICAL
  Filled 2021-06-29: qty 22

## 2021-06-29 MED ORDER — CEFAZOLIN SODIUM-DEXTROSE 2-4 GM/100ML-% IV SOLN
INTRAVENOUS | Status: AC
  Filled 2021-06-29: qty 100

## 2021-06-29 MED ORDER — MAGNESIUM HYDROXIDE 400 MG/5ML PO SUSP
30.0000 mL | Freq: Every day | ORAL | Status: DC | PRN
  Filled 2021-06-29: qty 30

## 2021-06-29 MED ORDER — PRAMIPEXOLE DIHYDROCHLORIDE 0.25 MG PO TABS
0.5000 mg | ORAL_TABLET | Freq: Two times a day (BID) | ORAL | Status: DC | PRN
  Filled 2021-06-29: qty 2

## 2021-06-29 MED ORDER — POTASSIUM CHLORIDE CRYS ER 20 MEQ PO TBCR: 20.0000 meq | EXTENDED_RELEASE_TABLET | Freq: Every day | ORAL | Status: DC | PRN

## 2021-06-29 MED ORDER — OXYCODONE HCL 5 MG/5ML PO SOLN: 5.0000 mg | Freq: Once | ORAL | Status: AC | PRN

## 2021-06-29 MED ORDER — ASCORBIC ACID 500 MG PO TABS
1000.0000 mg | ORAL_TABLET | Freq: Every day | ORAL | Status: DC
  Administered 2021-06-29 – 2021-07-01 (×3): 1000 mg via ORAL
  Filled 2021-06-29 (×3): qty 2

## 2021-06-29 MED ORDER — POVIDONE-IODINE 10 % EX SWAB: 2.0000 "application " | Freq: Once | CUTANEOUS | Status: DC

## 2021-06-29 MED ORDER — ALUM & MAG HYDROXIDE-SIMETH 200-200-20 MG/5ML PO SUSP: 15.0000 mL | ORAL | Status: DC | PRN

## 2021-06-29 MED ORDER — GABAPENTIN 400 MG PO CAPS
400.0000 mg | ORAL_CAPSULE | Freq: Two times a day (BID) | ORAL | Status: DC
  Administered 2021-06-29 – 2021-07-01 (×5): 400 mg via ORAL
  Filled 2021-06-29 (×5): qty 1

## 2021-06-29 MED ORDER — CEFAZOLIN SODIUM-DEXTROSE 2-4 GM/100ML-% IV SOLN
2.0000 g | Freq: Three times a day (TID) | INTRAVENOUS | Status: AC
Start: 2021-06-29 — End: 2021-06-29
  Administered 2021-06-29 (×2): 2 g via INTRAVENOUS
  Filled 2021-06-29 (×2): qty 100

## 2021-06-29 MED ORDER — ONDANSETRON HCL 4 MG/2ML IJ SOLN
INTRAMUSCULAR | Status: DC | PRN
  Administered 2021-06-29: 4 mg via INTRAVENOUS

## 2021-06-29 MED ORDER — OXYCODONE HCL 5 MG PO TABS
ORAL_TABLET | ORAL | Status: AC
  Filled 2021-06-29: qty 1

## 2021-06-29 MED ORDER — ONDANSETRON HCL 4 MG/2ML IJ SOLN: 4.0000 mg | Freq: Four times a day (QID) | INTRAMUSCULAR | Status: DC | PRN

## 2021-06-29 MED ORDER — ACETAMINOPHEN 500 MG PO TABS: 1000.0000 mg | ORAL_TABLET | Freq: Once | ORAL | Status: DC | PRN

## 2021-06-29 MED ORDER — ACETAMINOPHEN 325 MG PO TABS
325.0000 mg | ORAL_TABLET | Freq: Four times a day (QID) | ORAL | Status: DC | PRN
  Administered 2021-06-30: 325 mg via ORAL
  Filled 2021-06-29: qty 1

## 2021-06-29 MED ORDER — PANTOPRAZOLE SODIUM 40 MG PO TBEC
40.0000 mg | DELAYED_RELEASE_TABLET | Freq: Every day | ORAL | Status: DC
  Administered 2021-06-29 – 2021-07-01 (×3): 40 mg via ORAL
  Filled 2021-06-29 (×3): qty 1

## 2021-06-29 MED ORDER — BISACODYL 5 MG PO TBEC: 5.0000 mg | DELAYED_RELEASE_TABLET | Freq: Every day | ORAL | Status: DC | PRN

## 2021-06-29 MED ORDER — SODIUM CHLORIDE 0.9 % IV SOLN: INTRAVENOUS | Status: DC

## 2021-06-29 MED ORDER — OXYCODONE HCL 5 MG PO TABS
10.0000 mg | ORAL_TABLET | ORAL | Status: DC | PRN
  Administered 2021-06-29 – 2021-07-01 (×8): 15 mg via ORAL
  Filled 2021-06-29 (×9): qty 3

## 2021-06-29 MED ORDER — HYDROMORPHONE HCL 1 MG/ML IJ SOLN
INTRAMUSCULAR | Status: DC | PRN
  Administered 2021-06-29: .5 mg via INTRAVENOUS

## 2021-06-29 MED ORDER — PROPOFOL 10 MG/ML IV BOLUS
INTRAVENOUS | Status: AC
  Filled 2021-06-29: qty 40

## 2021-06-29 MED ORDER — INSULIN ASPART 100 UNIT/ML IJ SOLN
6.0000 [IU] | Freq: Three times a day (TID) | INTRAMUSCULAR | Status: DC
  Administered 2021-06-29 – 2021-06-30 (×3): 6 [IU] via SUBCUTANEOUS
  Filled 2021-06-29: qty 0.06

## 2021-06-29 MED ORDER — PANTOPRAZOLE SODIUM 40 MG PO TBEC: 40.0000 mg | DELAYED_RELEASE_TABLET | Freq: Every day | ORAL | Status: DC

## 2021-06-29 MED ORDER — OXYCODONE HCL 5 MG PO TABS: 5.0000 mg | ORAL_TABLET | ORAL | Status: DC | PRN

## 2021-06-29 MED ORDER — GUAIFENESIN-DM 100-10 MG/5ML PO SYRP: 15.0000 mL | ORAL_SOLUTION | ORAL | Status: DC | PRN

## 2021-06-29 MED ORDER — GABAPENTIN 400 MG PO CAPS
800.0000 mg | ORAL_CAPSULE | Freq: Every day | ORAL | Status: DC
  Administered 2021-06-29 – 2021-06-30 (×2): 800 mg via ORAL
  Filled 2021-06-29 (×2): qty 2

## 2021-06-29 MED ORDER — POLYETHYLENE GLYCOL 3350 17 G PO PACK: 17.0000 g | PACK | Freq: Every day | ORAL | Status: DC | PRN

## 2021-06-29 MED ORDER — ONDANSETRON HCL 4 MG/2ML IJ SOLN
INTRAMUSCULAR | Status: AC
  Filled 2021-06-29: qty 2

## 2021-06-29 MED ORDER — LORATADINE 10 MG PO TABS
10.0000 mg | ORAL_TABLET | Freq: Every day | ORAL | Status: DC
  Administered 2021-06-29 – 2021-07-01 (×3): 10 mg via ORAL
  Filled 2021-06-29 (×3): qty 1

## 2021-06-29 MED ORDER — GABAPENTIN 400 MG PO CAPS: 1600.0000 mg | ORAL_CAPSULE | ORAL | Status: DC

## 2021-06-29 MED ORDER — OXYCODONE HCL 5 MG PO TABS
5.0000 mg | ORAL_TABLET | Freq: Once | ORAL | Status: AC | PRN
  Administered 2021-06-29: 5 mg via ORAL

## 2021-06-29 MED ORDER — LIDOCAINE 2% (20 MG/ML) 5 ML SYRINGE
INTRAMUSCULAR | Status: DC | PRN
  Administered 2021-06-29: 100 mg via INTRAVENOUS

## 2021-06-29 MED ORDER — CEFAZOLIN SODIUM-DEXTROSE 2-4 GM/100ML-% IV SOLN: 2.0000 g | Freq: Once | INTRAVENOUS | Status: DC

## 2021-06-29 MED ORDER — MAGNESIUM CITRATE PO SOLN
1.0000 | Freq: Once | ORAL | Status: DC | PRN
  Filled 2021-06-29: qty 296

## 2021-06-29 MED ORDER — PREGABALIN 25 MG PO CAPS
25.0000 mg | ORAL_CAPSULE | Freq: Every day | ORAL | Status: DC
  Administered 2021-06-29 – 2021-06-30 (×2): 25 mg via ORAL
  Filled 2021-06-29 (×2): qty 1

## 2021-06-29 MED ORDER — INSULIN ASPART 100 UNIT/ML IJ SOLN
0.0000 [IU] | Freq: Three times a day (TID) | INTRAMUSCULAR | Status: DC
  Administered 2021-06-29: 15 [IU] via SUBCUTANEOUS
  Administered 2021-06-29: 4 [IU] via SUBCUTANEOUS
  Administered 2021-06-30: 11 [IU] via SUBCUTANEOUS
  Administered 2021-06-30: 7 [IU] via SUBCUTANEOUS
  Administered 2021-06-30: 4 [IU] via SUBCUTANEOUS
  Administered 2021-07-01: 7 [IU] via SUBCUTANEOUS
  Filled 2021-06-29: qty 0.2

## 2021-06-29 MED ORDER — PRAMIPEXOLE DIHYDROCHLORIDE 1 MG PO TABS
1.0000 mg | ORAL_TABLET | Freq: Three times a day (TID) | ORAL | Status: DC
  Administered 2021-06-29 – 2021-07-01 (×6): 1 mg via ORAL
  Filled 2021-06-29 (×10): qty 1

## 2021-06-29 MED ORDER — METHOCARBAMOL 750 MG PO TABS
750.0000 mg | ORAL_TABLET | Freq: Three times a day (TID) | ORAL | Status: DC | PRN
  Administered 2021-06-30 – 2021-07-01 (×3): 750 mg via ORAL
  Filled 2021-06-29 (×3): qty 1

## 2021-06-29 MED ORDER — LIDOCAINE 2% (20 MG/ML) 5 ML SYRINGE
INTRAMUSCULAR | Status: AC
  Filled 2021-06-29: qty 5

## 2021-06-29 MED ORDER — LEVOCETIRIZINE DIHYDROCHLORIDE 5 MG PO TABS: 5.0000 mg | ORAL_TABLET | Freq: Every evening | ORAL | Status: DC

## 2021-06-29 MED ORDER — ISOSORBIDE MONONITRATE ER 30 MG PO TB24
30.0000 mg | ORAL_TABLET | Freq: Every day | ORAL | Status: DC
  Administered 2021-06-29 – 2021-07-01 (×3): 30 mg via ORAL
  Filled 2021-06-29 (×3): qty 1

## 2021-06-29 SURGICAL SUPPLY — 32 items
BAG COUNTER SPONGE SURGICOUNT (BAG) ×2 IMPLANT
BAG SURGICOUNT SPONGE COUNTING (BAG) ×1
BLADE SAW RECIP 87.9 MT (BLADE) ×3 IMPLANT
BLADE SURG 21 STRL SS (BLADE) ×3 IMPLANT
CANISTER WOUND CARE 500ML ATS (WOUND CARE) ×6 IMPLANT
COVER SURGICAL LIGHT HANDLE (MISCELLANEOUS) ×3 IMPLANT
DRAPE DERMATAC (DRAPES) ×6 IMPLANT
DRAPE EXTREMITY T 121X128X90 (DISPOSABLE) ×3 IMPLANT
DRAPE HALF SHEET 40X57 (DRAPES) ×3 IMPLANT
DRAPE INCISE IOBAN 66X45 STRL (DRAPES) ×3 IMPLANT
DRAPE U-SHAPE 47X51 STRL (DRAPES) ×6 IMPLANT
DRESSING PREVENA PLUS CUSTOM (GAUZE/BANDAGES/DRESSINGS) ×2 IMPLANT
DRSG PREVENA PLUS CUSTOM (GAUZE/BANDAGES/DRESSINGS) ×6
DURAPREP 26ML APPLICATOR (WOUND CARE) ×3 IMPLANT
ELECT REM PT RETURN 9FT ADLT (ELECTROSURGICAL) ×3
ELECTRODE REM PT RTRN 9FT ADLT (ELECTROSURGICAL) ×1 IMPLANT
GLOVE SURG ORTHO LTX SZ9 (GLOVE) ×3 IMPLANT
GLOVE SURG UNDER POLY LF SZ9 (GLOVE) ×3 IMPLANT
GOWN STRL REUS W/ TWL XL LVL3 (GOWN DISPOSABLE) ×2 IMPLANT
GOWN STRL REUS W/TWL XL LVL3 (GOWN DISPOSABLE) ×6
KIT BASIN OR (CUSTOM PROCEDURE TRAY) ×3 IMPLANT
KIT TURNOVER KIT B (KITS) ×3 IMPLANT
MANIFOLD NEPTUNE II (INSTRUMENTS) ×3 IMPLANT
NS IRRIG 1000ML POUR BTL (IV SOLUTION) ×3 IMPLANT
PACK GENERAL/GYN (CUSTOM PROCEDURE TRAY) ×3 IMPLANT
PAD ARMBOARD 7.5X6 YLW CONV (MISCELLANEOUS) ×3 IMPLANT
PREVENA RESTOR ARTHOFORM 46X30 (CANNISTER) ×3 IMPLANT
STAPLER VISISTAT 35W (STAPLE) IMPLANT
SUT ETHILON 2 0 PSLX (SUTURE) ×15 IMPLANT
SUT SILK 2 0 (SUTURE)
SUT SILK 2-0 18XBRD TIE 12 (SUTURE) IMPLANT
TOWEL GREEN STERILE (TOWEL DISPOSABLE) ×3 IMPLANT

## 2021-06-29 NOTE — Progress Notes (Signed)
Notified Dr. Maple Hudson of pt's CBG 244. No orders at this time.

## 2021-06-29 NOTE — Transfer of Care (Signed)
Immediate Anesthesia Transfer of Care Note  Patient: [name]  Procedure(s) Performed: REVISION RIGHT BELOW KNEE AMPUTATION (Right: Leg Lower)  Patient Location: PACU  Anesthesia Type:General  Level of Consciousness: drowsy, patient cooperative and responds to stimulation  Airway & Oxygen Therapy: Patient Spontanous Breathing  Post-op Assessment: Report given to RN and Post -op Vital signs reviewed and stable  Post vital signs: Reviewed and stable  Last Vitals:  Vitals Value Taken Time  BP 154/131 06/29/21 1117  Temp    Pulse 85 06/29/21 1119  Resp 10 06/29/21 1119  SpO2 92 % 06/29/21 1119  Vitals shown include unvalidated device data.  Last Pain:  Vitals:   06/29/21 0800  TempSrc:   PainSc: 7       Patients Stated Pain Goal: 0 (06/29/21 0800)  Complications: No notable events documented.

## 2021-06-29 NOTE — Anesthesia Procedure Notes (Signed)
Procedure Name: LMA Insertion Date/Time: 06/29/2021 10:26 AM Performed by: Marny Lowenstein, CRNA Pre-anesthesia Checklist: Patient identified, Emergency Drugs available, Suction available and Patient being monitored Patient Re-evaluated:Patient Re-evaluated prior to induction Oxygen Delivery Method: Circle system utilized Preoxygenation: Pre-oxygenation with 100% oxygen Induction Type: IV induction Ventilation: Mask ventilation without difficulty LMA: LMA inserted LMA Size: 4.0 Number of attempts: 1 Airway Equipment and Method: Patient positioned with wedge pillow Placement Confirmation: positive ETCO2 and breath sounds checked- equal and bilateral Tube secured with: Tape Dental Injury: Teeth and Oropharynx as per pre-operative assessment

## 2021-06-29 NOTE — Progress Notes (Signed)
Paged Dr. Maple Hudson to inform him of pt's CBG 244. He is not available at this time

## 2021-06-29 NOTE — Progress Notes (Signed)
Orthopedic Tech Progress Note Patient Details:  [name] 07/30/1961 [id number]  Called in order to [employer] for a VIVE PROTOCOL BK   Patient ID: [name], female   DOB: November 23, 1960, 60 y.o.   MRN: [id number]  [name] 06/29/2021, 12:08 PM

## 2021-06-29 NOTE — Progress Notes (Signed)
On admission to Short Stay, pt noted to have stg 2 ulcer approx. 1x1cm on rt gluteal/sacral area. Dsg intact. Also present were several abrasions on rt forearm:no drainage,swelling or odor present. Foam dsg applied. An abrasion was also present on pt's left medial thigh without drainage, odor or swelling present. Foam dsg applied. Pt tolerated well.

## 2021-06-29 NOTE — H&P (Signed)
[name] is an 60 y.o. female.   Chief Complaint: Dehiscence right transtibial amputation. HPI: Patient is a 60 year old woman who is status post a right transtibial amputation.  Patient fell on the residual limb about 2 weeks ago and has had progressive wound dehiscence.  Patient has had increasing redness and swelling.  Past Medical History:  Diagnosis Date   Aortic stenosis    mild AS by echo 02/2021   Asthma    "on daily RX and rescue inhaler" (04/18/2018)   Chronic diastolic CHF (congestive heart failure) (HCC) 09/2015   Chronic lower back pain    Chronic neck pain    Chronic pain syndrome    Fentanyl Patch   Colon polyps    Coronary artery disease    cath with normal LM, 30% LAD, 85% mid RCA and 95% distal RCA s/p PCI of the mid to distal RCA and now on DAPT with ASA and Ticagrelor.     Eczema    Excessive daytime sleepiness 11/26/2015   Fibromyalgia    Gallstones    GERD (gastroesophageal reflux disease)    Heart murmur    "noted for the 1st time on 04/18/2018"   History of blood transfusion 07/2010   "S/P oophorectomy"   History of gout    History of hiatal hernia 1980s   "gone now" (04/18/2018)   Hyperlipidemia    Hypertension    takes Metoprolol and Enalapril daily   Hypothyroidism    takes Synthroid daily   IBS (irritable bowel syndrome)    Migraine    "nothing in the 2000s" (04/18/2018)   Mixed connective tissue disease (HCC)    NAFLD (nonalcoholic fatty liver disease)    Pneumonia    "several times" (04/18/2018)   PVC's (premature ventricular contractions)    noted on event monitor 02/2021   Rheumatoid arthritis (HCC)    "hands, elbows, shoulders, probably knees" (04/18/2018)   Scoliosis    Sleep apnea    mild - does not use cpap   Spondylosis    Type II diabetes mellitus (HCC)    takes Metformin and Hum R daily (04/18/2018)   Walker as ambulation aid    also uses wheelchair    Past Surgical History:  Procedure Laterality Date   ABDOMINAL HYSTERECTOMY   06/2005   "w/right ovariy"   AMPUTATION Right 01/28/2020    RIGHT FOURTH AND FIFTH RAY AMPUTATION   AMPUTATION Right 01/28/2020   Procedure: RIGHT FOURTH AND FIFTH RAY AMPUTATION,;  Surgeon: [name], MD;  Location: [facility] OR;  Service: Orthopedics;  Laterality: Right;   AMPUTATION Right 06/01/2021   Procedure: RIGHT BELOW KNEE AMPUTATION;  Surgeon: [name], MD;  Location: [facility] OR;  Service: Orthopedics;  Laterality: Right;   APPENDECTOMY     BREAST BIOPSY Bilateral    9 total (04/18/2018)   CORONARY ANGIOPLASTY WITH STENT PLACEMENT  10/01/2015   normal LM, 30% LAD, 85% mid RCA and 95% distal RCA s/p PCI of the mid to distal RCA and now on DAPT with ASA and Ticagrelor.     CORONARY STENT INTERVENTION Right 04/18/2018   Procedure: CORONARY STENT INTERVENTION;  Surgeon: [name], MD;  Location: [facility] IN[facility]ASIVE [facility];  Service: Cardiovascular;  Laterality: Right;   DILATION AND CURETTAGE OF UTERUS     FRACTURE SURGERY     LAPAROSCOPIC CHOLECYSTECTOMY     LEFT HEART CATH AND CORONARY ANGIOGRAPHY N/A 04/18/2018   Procedure: LEFT HEART CATH AND CORONARY ANGIOGRAPHY;  Surgeon: Clifton James,  Nile Dear, MD;  Location: [facility];  Service: Cardiovascular;  Laterality: N/A;   MUSCLE BIOPSY Left    "leg"   OOPHORECTOMY  07/2010   RADIOLOGY WITH ANESTHESIA N/A 05/25/2016   Procedure: RADIOLOGY WITH ANESTHESIA;  Surgeon: Medication Radiologist, MD;  Location: [facility] OR;  Service: Radiology;  Laterality: N/A;   TEE WITHOUT CARDIOVERSION N/A 06/01/2021   Procedure: TRANSESOPHAGEAL ECHOCARDIOGRAM (TEE);  Surgeon: Sande Rives, MD;  Location: [facility] OR;  Service: Cardiovascular;  Laterality: N/A;   TONSILLECTOMY AND ADENOIDECTOMY     WRIST FRACTURE SURGERY Left    "crushed it"    Family History  Problem Relation Age of Onset   CAD Mother    Hypertension Mother    Heart attack Mother    CAD Father    Heart attack Father    Allergic rhinitis Father    Asthma Father     Hypertension Brother    Hypertension Brother    Pancreatic cancer Paternal Aunt    Breast cancer Paternal Aunt    Lung cancer Paternal Aunt    Asthma Son    Social History:  reports that she quit smoking about 17 years ago. Her smoking use included cigarettes. She started smoking about 36 years ago. She has a 19.00 pack-year smoking history. She has never used smokeless tobacco. She reports current alcohol use. She reports that she does not currently use drugs after having used the following drugs: Marijuana.  Allergies:  Allergies  Allergen Reactions   Fish Allergy Anaphylaxis    INCLUDES OMEGA 3 OILS   Fish Oil Anaphylaxis and Swelling    THROAT SWELLS INCLUDES FISH AS A CLASS   Omega-3 Fatty Acids Swelling    Throat swelling  Has tolerated Glucerna nutritional drinks    Crestor [Rosuvastatin] Other (See Comments)    Extreme joint pain, to this & other statins   Gabapentin Anxiety    Anxiety on high doses   Lovastatin Other (See Comments)    EXTREME JOINT PAIN   Metronidazole Nausea Only and Swelling    Headache and shakes Nausea and vomiting   Red Yeast Rice [Cholestin] Other (See Comments)    Muscle pain and severe joint pain.    Rotigotine Swelling    (Neupro) Extreme edema    Atrovent Hfa [Ipratropium Bromide Hfa] Other (See Comments)    wheezing   Iodine Swelling   Other Other (See Comments)    Surgical Staples causes redness/infection per patient.   Pepto-Bismol [Bismuth Subsalicylate] Nausea And Vomiting    Extreme vomiting   Red Yeast Rice Extract     Joint pain   Victoza [Liraglutide]     Unsure of reaction    Enalapril Cough   Suprep [Na Sulfate-K Sulfate-Mg Sulf] Nausea And Vomiting   Tape Other (See Comments)    Electrodes causes skin breakdown    Medications Prior to Admission  Medication Sig Dispense Refill   acetaminophen (TYLENOL) 500 MG tablet Take 1 tablet (500 mg total) by mouth 4 (four) times daily -  with meals and at bedtime. 300 tablet 0    Acetylcysteine, Nutrient, (N-ACETYL CYSTEINE) 600 MG TABS Take 600 mg by mouth in the morning and at bedtime.     albuterol (PROVENTIL) (2.5 MG/3ML) 0.083% nebulizer solution USE 1 VIAL IN NEBULIZER EVERY 4 HOURS AS NEEDED FOR WHEEZING OR  FOR  SHORTNESS  OF  BREATH (Patient taking differently: Take 2.5 mg by nebulization every 4 (four) hours as needed.) 450 mL 0  ascorbic acid (VITAMIN C) 500 MG tablet Take 1 tablet (500 mg total) by mouth 2 (two) times daily. 60 tablet 0   aspirin EC 81 MG tablet Take 81 mg by mouth every evening.     betamethasone dipropionate 0.05 % cream Apply 1 application topically 2 (two) times daily as needed (extreme itching).     cephALEXin (KEFLEX) 500 MG capsule Take 1 capsule (500 mg total) by mouth every 6 (six) hours. 40 capsule 0   collagenase (SANTYL) ointment Apply 1 application topically daily. 30 g 0   cyclobenzaprine (FLEXERIL) 10 MG tablet Take 1 tablet (10 mg total) by mouth 3 (three) times daily as needed for muscle spasms. 90 tablet 0   diclofenac Sodium (VOLTAREN) 1 % GEL Apply 2 g topically 4 (four) times daily. (Patient taking differently: Apply 2 g topically in the morning, at noon, and at bedtime.) 400 g 0   diltiazem (CARDIZEM CD) 240 MG 24 hr capsule Take 1 capsule (240 mg total) by mouth daily. (Patient taking differently: Take 240 mg by mouth at bedtime.) 30 capsule 0   diphenhydrAMINE (BENADRYL) 25 MG tablet Take 25 mg by mouth daily as needed for allergies or itching.     EPINEPHrine (EPIPEN 2-PAK) 0.3 mg/0.3 mL IJ SOAJ injection USE AS DIRECTED FOR SEVERE ALLERGIC REACTION. (Patient taking differently: Inject 0.3 mg into the muscle as needed for anaphylaxis. Use as directed for severe allergic reaction) 2 each 0   esomeprazole (NEXIUM) 20 MG capsule Take 40 mg by mouth in the morning.     ezetimibe (ZETIA) 10 MG tablet Take 1 tablet (10 mg total) by mouth daily. (Patient taking differently: Take 10 mg by mouth every evening.) 30 tablet 0    fluconazole (DIFLUCAN) 150 MG tablet Take 150 mg by mouth every 3 (three) days.     fluticasone (FLONASE) 50 MCG/ACT nasal spray USE 2 SPRAY(S) IN EACH NOSTRIL ONCE DAILY AS NEEDED FOR  ALLERGIES  OR  RHINITIS (Patient taking differently: Place 2 sprays into both nostrils daily as needed for rhinitis or allergies.) 16 g 0   folic acid (FOLVITE) 800 MCG tablet Take 1,600 mcg by mouth daily at 12 noon.     furosemide (LASIX) 20 MG tablet Take 20-40 mg by mouth 3 (three) times daily as needed (fluid retention/swelling).     gabapentin (NEURONTIN) 400 MG capsule Take 1 capsule by mouth in the morning, 1 capsule at noon, and 2 capsules at bed time. 120 capsule 0   GLUCOMANNAN PO Take 1,995 mg by mouth in the morning, at noon, and at bedtime.     hydrocortisone cream 1 % Apply 1 application topically 3 (three) times daily as needed for itching (minor skin irritation). 28 g 0   hydroxychloroquine (PLAQUENIL) 200 MG tablet Take 1 tablet (200 mg total) by mouth 2 (two) times daily. 180 tablet 0   insulin regular human CONCENTRATED (HUMULIN R) 500 UNIT/ML injection Inject 0.08-0.3 mLs (40-150 Units total) into the skin 3 (three) times daily with meals. (Patient taking differently: Inject 20-150 Units into the skin 3 (three) times daily with meals.) 20 mL 0   isosorbide mononitrate (IMDUR) 30 MG 24 hr tablet Take 1 tablet (30 mg total) by mouth daily. (Patient taking differently: Take 30 mg by mouth at bedtime.) 90 tablet 3   levocetirizine (XYZAL) 5 MG tablet TAKE 1 TABLET BY MOUTH ONCE DAILY IN THE EVENING (Patient taking differently: Take 5 mg by mouth every evening.) 90 tablet 0  levothyroxine (SYNTHROID) 200 MCG tablet TAKE 1 TABLET BY MOUTH ONCE DAILY BEFORE BREAKFAST (Patient taking differently: Take 200 mcg by mouth daily before breakfast.) 90 tablet 0   metFORMIN (GLUCOPHAGE-XR) 500 MG 24 hr tablet Take 2 tablets by mouth twice daily 360 tablet 0   methocarbamol (ROBAXIN) 750 MG tablet Take 750 mg by  mouth every 8 (eight) hours as needed for muscle spasms.     mupirocin ointment (BACTROBAN) 2 % Apply 1 application topically 2 (two) times daily as needed (wound care).     naloxone (NARCAN) nasal spray 4 mg/0.1 mL Place 1 spray into the nose as needed (opoid overdose).     nitroGLYCERIN (NITROSTAT) 0.4 MG SL tablet DISSOLVE ONE TABLET UNDER THE TONGUE EVERY 5 MINUTES AS NEEDED FOR CHEST PAIN.  DO NOT EXCEED A TOTAL OF 3 DOSES IN 15 MINUTES (Patient taking differently: Place 0.4 mg under the tongue every 5 (five) minutes as needed for chest pain.) 25 tablet 0   Oxycodone HCl 10 MG TABS Take 1 tablet (10 mg total) by mouth every 8 (eight) hours as needed (pain). (Patient taking differently: Take 10 mg by mouth in the morning, at noon, and at bedtime.) 90 tablet 0   oxymetazoline (AFRIN) 0.05 % nasal spray Place 2 sprays into both nostrils 2 (two) times daily.     POT BICARB-POT CHLORIDE,25MEQ, 25 MEQ TBEF Take 25 mEq by mouth 3 (three) times daily as needed (with lasix).     pramipexole (MIRAPEX) 1 MG tablet Take 1 tablet (1 mg total) by mouth 3 (three) times daily. (Patient taking differently: Take 1 mg by mouth See admin instructions. Take 1 tablet (1 mg) by mouth scheduled 3 times daily & take 0.5 tablet (0.5 mg) by mouth twice daily if needed for breakthrough restless leg) 90 tablet 0   pregabalin (LYRICA) 25 MG capsule Take 1 capsule (25 mg total) by mouth at bedtime. 30 capsule 0   promethazine (PHENERGAN) 25 MG tablet TAKE 1 TABLET BY MOUTH EVERY 8 HOURS AS NEEDED FOR NAUSEA AND FOR VOMITING (Patient taking differently: 25 mg every 8 (eight) hours as needed for vomiting or nausea.) 90 tablet 2   saccharomyces boulardii (FLORASTOR) 250 MG capsule Take 250 mg by mouth 3 (three) times daily.     tiZANidine (ZANAFLEX) 2 MG tablet Take 1 tablet (2 mg total) by mouth 3 (three) times daily. 90 tablet 0   traMADol (ULTRAM) 50 MG tablet Take 1 tablet (50 mg total) by mouth every 6 (six) hours as needed for  moderate pain. 28 tablet 0   valsartan (DIOVAN) 160 MG tablet Take 1 tablet (160 mg total) by mouth daily. (Patient taking differently: Take 160 mg by mouth at bedtime.) 90 tablet 3   vitamin B-12 (CYANOCOBALAMIN) 1000 MCG tablet Take 1,000 mcg by mouth in the morning.     Vitamin D, Ergocalciferol, (DRISDOL) 1.25 MG (50000 UNIT) CAPS capsule TAKE 1 CAPSULE BY MOUTH ONCE A WEEK (EVERY  7  DAYS)  TAKE  ON  FRIDAY (Patient taking differently: Take 50,000 Units by mouth every Friday. TAKE 1 CAPSULE BY MOUTH ONCE A WEEK (EVERY  7  DAYS)  TAKE  ON  FRIDAY) 12 capsule 3   Zinc Sulfate 220 (50 Zn) MG TABS Take 1 tablet (220 mg total) by mouth daily. (Patient taking differently: Take 220 mg by mouth at bedtime.) 90 tablet 0   camphor-menthol (SARNA) lotion Apply topically as needed for itching. (Patient not taking: Reported on 06/28/2021) 222  mL 0   glucose blood test strip 1 each by Other route 4 (four) times daily. Contour Next strips 100 each 12   INSULIN SYRINGE 1CC/29G (B-D INSULIN SYRINGE) 29G X 1/2" 1 ML MISC Use three times daily with insulin.  DX E11.9 200 each 3   lidocaine (LIDODERM) 5 % Place 1 patch onto the skin daily. Remove & Discard patch within 12 hours or as directed by MD 60 patch 0    No results found for this or any previous visit (from the past 48 hour(s)). No results found.  Review of Systems  All other systems reviewed and are negative.  Height 5\' 2"  (1.575 m), weight (!) 141.5 kg. Physical Exam  On examination patient is alert oriented no adenopathy well-dressed normal affect normal respiratory effort.  There is wound dehiscence of the transtibial amputation the right approximately 2 cm wide.  There is necrotic tissue in the wound bed with fibrinous exudative tissue.  There is serous drainage with surrounding erythema and induration.  There is no ascending cellulitis there is no purulence. Assessment/Plan Assessment: Dehiscence right transtibial amputation.  Plan due to the  wound dehiscence and progressive necrotic changes we will plan for revision of the amputation.  Risks and benefits were discussed including a higher level amputation.  Patient states she understands wishes to proceed at this time.  , MD 06/29/2021, 6:53 AM

## 2021-06-29 NOTE — Progress Notes (Signed)
Inpatient Diabetes Program Recommendations  AACE/ADA: New Consensus Statement on Inpatient Glycemic Control (2015)  Target Ranges:  Prepandial:   less than 140 mg/dL      Peak postprandial:   less than 180 mg/dL (1-2 hours)      Critically ill patients:  140 - 180 mg/dL   Lab Results  Component Value Date   GLUCAP 168 (H) 06/29/2021   HGBA1C 7.3 (H) 05/27/2021    Review of Glycemic Control Results for [name], [name] (MRN [id number]) as of 06/29/2021 15:01  Ref. Range 06/29/2021 07:11 06/29/2021 08:57 06/29/2021 11:20  Glucose-Capillary Latest Ref Range: 70 - 99 mg/dL 242 (H) 353 (H) 614 (H)   Diabetes history: DM2 Outpatient Diabetes medications: U 500 20-150 units tid, Metformin 1 gm bid Current orders for Inpatient glycemic control: Novolog 6 units tid meal coverage, Metformin 1 gm bid, Novolog correction 0-20 units tid  Inpatient Diabetes Program Recommendations:   Consider adding: -Semglee 32 units qd  Thank you, [name]. [name], RN, MSN, CDE  Diabetes Coordinator Inpatient Glycemic Control Team Team Pager [phone] (8am-5pm) 06/29/2021 3:02 PM

## 2021-06-29 NOTE — Op Note (Signed)
06/29/2021  11:37 AM  PATIENT:  [name]    PRE-OPERATIVE DIAGNOSIS:  Pre-op diagnosis: Dehiscence/Necrosis Right Below Knee Amputation  POST-OPERATIVE DIAGNOSIS:  Same  PROCEDURE:  REVISION RIGHT BELOW KNEE AMPUTATION  SURGEON:  [name], MD  PHYSICIAN ASSISTANT:None ANESTHESIA:   General  PREOPERATIVE INDICATIONS:  [name] is a  60 y.o. female with a diagnosis of Pre-op diagnosis: Dehiscence/Necrosis Right Below Knee Amputation who failed conservative measures and elected for surgical management.    The risks benefits and alternatives were discussed with the patient preoperatively including but not limited to the risks of infection, bleeding, nerve injury, cardiopulmonary complications, the need for revision surgery, among others, and the patient was willing to proceed.  OPERATIVE IMPLANTS: Praveena customizable and Arthur form wound VAC.  @ENCIMAGES @  OPERATIVE FINDINGS: Patient had ischemic muscle changes but no deep abscess.  The tissue margins were clear.  OPERATIVE PROCEDURE: Patient was brought the operating room and underwent a general anesthetic.  After adequate levels anesthesia obtained patient's right lower extremity was prepped using DuraPrep draped into a sterile field a timeout was called.  A fishmouth incision was made around the ischemic wound dehiscence.  This was carried sharply down to bone the distal 2 cm of tibia and fibula were resected the anterior aspect of the tibia was beveled.  Electrocardio was used for hemostasis the vascular bundles were suture ligated with 2-0 silk.  The deep and superficial fascial layers and skin was closed using 2-0 nylon.  The Prevena customizable and form wound VAC was applied the dressing was sealed with Ioban on the drape only and the skin was sealed with derma tack.  This had a good suction fit patient was extubated taken the PACU in stable condition.   DISCHARGE PLANNING:  Antibiotic duration: 24  hours antibiotics  Weightbearing: Nonweightbearing on the right  Pain medication: Opioid pathway  Dressing care/ Wound VAC: Continue wound VAC for 1 week  Ambulatory devices: Walker  Discharge to: Anticipate discharge to skilled nursing or inpatient rehab.  Follow-up: In the office 1 week post operative.

## 2021-06-30 ENCOUNTER — Encounter (HOSPITAL_COMMUNITY): Payer: Self-pay | Admitting: Orthopedic Surgery

## 2021-06-30 LAB — BASIC METABOLIC PANEL
Anion gap: 9 (ref 5–15)
BUN: 36 mg/dL — ABNORMAL HIGH (ref 6–20)
CO2: 22 mmol/L (ref 22–32)
Calcium: 8.8 mg/dL — ABNORMAL LOW (ref 8.9–10.3)
Chloride: 105 mmol/L (ref 98–111)
Creatinine, Ser: 1.12 mg/dL — ABNORMAL HIGH (ref 0.44–1.00)
GFR, Estimated: 57 mL/min — ABNORMAL LOW (ref >60)
Glucose, Bld: 288 mg/dL — ABNORMAL HIGH (ref 70–99)
Potassium: 5.7 mmol/L — ABNORMAL HIGH (ref 3.5–5.1)
Sodium: 136 mmol/L (ref 135–145)

## 2021-06-30 LAB — GLUCOSE, CAPILLARY
Glucose-Capillary: 223 mg/dL — ABNORMAL HIGH (ref 70–99)
Glucose-Capillary: 264 mg/dL — ABNORMAL HIGH (ref 70–99)
Glucose-Capillary: 182 mg/dL — ABNORMAL HIGH (ref 70–99)
Glucose-Capillary: 249 mg/dL — ABNORMAL HIGH (ref 70–99)

## 2021-06-30 LAB — CBC
HCT: 28.8 % — ABNORMAL LOW (ref 36.0–46.0)
Hemoglobin: 8.2 g/dL — ABNORMAL LOW (ref 12.0–15.0)
MCH: 24.6 pg — ABNORMAL LOW (ref 26.0–34.0)
MCHC: 28.5 g/dL — ABNORMAL LOW (ref 30.0–36.0)
MCV: 86.2 fL (ref 80.0–100.0)
Platelets: 243 10*3/uL (ref 150–400)
RBC: 3.34 MIL/uL — ABNORMAL LOW (ref 3.87–5.11)
RDW: 16.7 % — ABNORMAL HIGH (ref 11.5–15.5)
WBC: 11.5 10*3/uL — ABNORMAL HIGH (ref 4.0–10.5)
nRBC: 0 % (ref 0.0–0.2)

## 2021-06-30 MED ORDER — INSULIN ASPART 100 UNIT/ML IJ SOLN
10.0000 [IU] | Freq: Three times a day (TID) | INTRAMUSCULAR | Status: DC
  Administered 2021-06-30 – 2021-07-01 (×3): 10 [IU] via SUBCUTANEOUS

## 2021-06-30 MED ORDER — CHLORHEXIDINE GLUCONATE CLOTH 2 % EX PADS
6.0000 | MEDICATED_PAD | Freq: Every day | CUTANEOUS | Status: DC
  Administered 2021-07-01: 6 via TOPICAL

## 2021-06-30 MED ORDER — MUPIROCIN 2 % EX OINT
1.0000 | TOPICAL_OINTMENT | Freq: Two times a day (BID) | CUTANEOUS | Status: DC
Start: 2021-06-30 — End: 2021-07-01
  Administered 2021-06-30 – 2021-07-01 (×3): 1 via NASAL
  Filled 2021-06-30: qty 22

## 2021-06-30 MED ORDER — ADULT MULTIVITAMIN W/MINERALS CH
1.0000 | ORAL_TABLET | Freq: Every day | ORAL | Status: DC
  Administered 2021-06-30 – 2021-07-01 (×2): 1 via ORAL
  Filled 2021-06-30 (×2): qty 1

## 2021-06-30 MED ORDER — KETOROLAC TROMETHAMINE 15 MG/ML IJ SOLN
7.5000 mg | Freq: Four times a day (QID) | INTRAMUSCULAR | Status: DC
  Administered 2021-06-30 – 2021-07-01 (×4): 7.5 mg via INTRAVENOUS
  Filled 2021-06-30 (×4): qty 1

## 2021-06-30 MED ORDER — INSULIN DETEMIR 100 UNIT/ML ~~LOC~~ SOLN
27.0000 [IU] | Freq: Every day | SUBCUTANEOUS | Status: DC
  Administered 2021-06-30: 27 [IU] via SUBCUTANEOUS
  Filled 2021-06-30 (×2): qty 0.27

## 2021-06-30 NOTE — Progress Notes (Signed)
Patient ID: [name], female   DOB: 1960/10/10, 60 y.o.   MRN: [id number] Patient is postoperative day 1 revision right transtibial amputation.  Patient states she did have pain last night she states she is comfortable this morning.  There is no drainage in the wound VAC canister.  Patient has had a long history of narcotic use which makes it difficult for pain control.  Patient's BUN is 36 creatinine 1.12.  Will put her on a low-dose of Toradol to try to help with her pain control.

## 2021-06-30 NOTE — Progress Notes (Signed)
Inpatient Diabetes Program Recommendations  AACE/ADA: New Consensus Statement on Inpatient Glycemic Control   Target Ranges:  Prepandial:   less than 140 mg/dL      Peak postprandial:   less than 180 mg/dL (1-2 hours)      Critically ill patients:  140 - 180 mg/dL   Results for [name], [name] (MRN [id number]) as of 06/30/2021 08:54  Ref. Range 06/29/2021 07:11 06/29/2021 08:57 06/29/2021 11:20 06/29/2021 16:31 06/29/2021 22:13 06/30/2021 06:43  Glucose-Capillary Latest Ref Range: 70 - 99 mg/dL 160 (H) 109 (H) 323 (H) 330 (H) 292 (H) 223 (H)   Review of Glycemic Control  Diabetes history: DM2 Outpatient Diabetes medications: Humulin R U500 20-150 units TID, Metformin 1000 mg BID Current orders for Inpatient glycemic control: Novolog 0-20 units TID with meals, Novolog 6 units TID with meals, Metformin XR 1000 mg BID  Inpatient Diabetes Program Recommendations:    Insulin: Please consider ordering Levemir 27 units Q24H (based on 136 kg x 0.2 units), adding Novolog 0-5 units QHS for bedtime correction, and increasing meal coverage to Novolog 10 units TID with meals.  Thanks, Orlando Penner, RN, MSN, CDE Diabetes Coordinator Inpatient Diabetes Program [phone] (Team Pager from 8am to 5pm)

## 2021-06-30 NOTE — Plan of Care (Signed)

## 2021-06-30 NOTE — TOC Initial Note (Addendum)
Transition of Care [facility]) - Initial/Assessment Note    Patient Details  Name: [name] MRN: [id number] Date of Birth: [birth year]  Transition of Care [facility]) CM/SW Contact:    Epifanio Lesches, RN Phone Number: 06/30/2021, 4:07 PM  Clinical Narrative:                 Readmitted s/p  REVISION RIGHT BELOW KNEE AMPUTATION, 10/19. Recently d/c from CIR, 10/14.  Setup with [facility] with CIR d/c. Start of care didn't begin before pt represented back to hospital. Pt/husband stated they hoping to go home @ d/c with current hospital stay and agreeable to home health services and ok with using [facility].   Per PT's eval/recommendation 10/20 : Home health PT;Supervision/Assistance - 24 hour;Supervision for mobility/OOB   NCM reached out to [facility] to make them aware of hospital stay and need for home health services. Stacie/liaison stated they will accept for home health services.  Pt without noted DME needs.  TOC team following and will assist with TOC needs.   Expected Discharge Plan: Home w Home Health Services Barriers to Discharge: Continued Medical Work up   Patient Goals and CMS Choice     Choice offered to / list presented to : Patient  Expected Discharge Plan and Services Expected Discharge Plan: Home w Home Health Services   Discharge Planning Services: CM Consult   Living arrangements for the past 2 months: Single Family Home                             HH Agency: [facility] Date [facility] Agency Contacted: 06/30/21 Time HH Agency Contacted: [id number] Representative spoke with at [facility] Agency: Stacie  Prior Living Arrangements/Services Living arrangements for the past 2 months: Single Family Home Lives with:: Self, Spouse Patient language and need for interpreter reviewed:: No Do you feel safe going back to the place where you live?: Yes      Need for Family Participation in Patient Care: Yes (Comment) Care giver support system in place?: Yes  (comment) Current home services: DME (W/C,rolling walker and BSC) Criminal Activity/Legal Involvement Pertinent to Current Situation/Hospitalization: No - Comment as needed  Activities of Daily Living Home Assistive Devices/Equipment: Wheelchair, Environmental consultant (specify type), CBG Meter, Shower chair without back ADL Screening (condition at time of admission) Patient's cognitive ability adequate to safely complete daily activities?: No Is the patient deaf or have difficulty hearing?: Yes Does the patient have difficulty seeing, even when wearing glasses/contacts?: Yes Does the patient have difficulty concentrating, remembering, or making decisions?: No Patient able to express need for assistance with ADLs?: Yes Does the patient have difficulty dressing or bathing?: Yes Independently performs ADLs?: No Communication: Independent Dressing (OT): Needs assistance Is this a change from baseline?: Pre-admission baseline Grooming: Needs assistance Is this a change from baseline?: Pre-admission baseline Feeding: Independent Bathing: Needs assistance Is this a change from baseline?: Pre-admission baseline Toileting: Needs assistance Is this a change from baseline?: Pre-admission baseline In/Out Bed: Needs assistance Is this a change from baseline?: Pre-admission baseline Is this a change from baseline?: Pre-admission baseline Does the patient have difficulty walking or climbing stairs?: Yes Weakness of Legs: Both Weakness of Arms/Hands: None  Permission Sought/Granted   Permission granted to share information with : Yes, Verbal Permission Granted  Share Information with NAME: Kinslie Hove (Spouse)  [phone]           Emotional Assessment Appearance:: Appears stated age  Attitude/Demeanor/Rapport: Engaged Affect (typically observed): Accepting Orientation: : Oriented to Self, Oriented to Place, Oriented to  Time, Oriented to Situation Alcohol / Substance Use: Not Applicable Psych  Involvement: No (comment)  Admission diagnosis:  Hx of BKA, right (HCC) [Z89.511] Patient Active Problem List   Diagnosis Date Noted   Hx of BKA, right (HCC) 06/29/2021   Dehiscence of amputation stump (HCC)    Acute blood loss anemia 06/24/2021   Postoperative wound infection 06/24/2021   UTI (urinary tract infection) 06/24/2021   Superficial dehiscence of operation wound 06/24/2021   Unilateral complete BKA, right, subsequent encounter (HCC)    Bacteremia due to Enterococcus 05/28/2021   Foot osteomyelitis (HCC) 05/26/2021   Sepsis (HCC) 05/26/2021   Digestive disorder 05/18/2021   Bilateral lower extremity edema 05/02/2021   Statin myopathy 03/04/2021   Aortic stenosis 03/04/2021   Sjogren's syndrome (HCC) 02/11/2020   Acute hematogenous osteomyelitis of right foot (HCC) 01/28/2020   Cutaneous abscess of right foot    Subacute osteomyelitis, right ankle and foot (HCC)    Statin intolerance 08/16/2019   Gram-negative bacteremia 10/18/2018   Streptococcal bacteremia 10/18/2018   Ulcer of lower extremity, limited to breakdown of skin (HCC) 10/03/2018   CAD S/P percutaneous coronary angioplasty 04/19/2018   Essential hypertension    Moderate persistent asthma 03/21/2018   NAFLD (nonalcoholic fatty liver disease)    Recurrent cellulitis of lower extremity 01/02/2018   Cellulitis of lower leg 01/02/2018   Chronic diarrhea 05/08/2017   H/O Clostridium difficile infection 05/08/2017   Rectal bleeding 05/08/2017   Hyperbilirubinemia 04/29/2016   Hyponatremia 04/29/2016   Cellulitis of right foot 03/09/2016   Mild persistent asthma 02/15/2016   Allergic rhinitis due to pollen 02/15/2016   Anaphylactic reaction due to food 02/10/2016   Diabetes mellitus type 2 in obese (HCC) 02/10/2016   Gastroesophageal reflux disease without esophagitis 02/10/2016   Atopic eczema 02/10/2016   Cervical nerve root disorder 12/15/2015   Lumbar radiculopathy 12/15/2015   Daytime somnolence  11/26/2015   Venous stasis dermatitis of both lower extremities 02/11/2015   Morbid obesity (HCC) 06/19/2014   Abnormal LFTs 03/26/2014   B-complex deficiency 03/26/2014   Benign essential HTN 03/26/2014   Chronic pain associated with significant psychosocial dysfunction 03/26/2014   Diaphragmatic hernia 03/26/2014   Gastroesophageal reflux disease 03/26/2014   Hammer toe 03/26/2014   H/O neoplasm 03/26/2014   Adaptive colitis 03/26/2014   Diabetic polyneuropathy (HCC) 03/26/2014   Deafness, sensorineural 03/26/2014   Fibromyalgia 01/20/2014   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/20/2014   Hyperlipidemia 11/11/2012   Mixed connective tissue disease (HCC) 11/11/2012   Hypothyroidism 11/11/2012   Uncomplicated asthma 11/11/2012   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HCC) 02/20/2012   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 [place] [address] Phone: [phone] Fax: [phone]     Social Determinants of Health (SDOH) Interventions    Readmission Risk Interventions No flowsheet data found.

## 2021-06-30 NOTE — Evaluation (Signed)
Occupational Therapy Evaluation Patient Details Name: [name] MRN: [id number] DOB: 08-20-1961 Today's Date: 06/30/2021   History of Present Illness Pt is 60 yo female who presented with dehiscence of right transtibial amputation surgery (surgery 06/01/21).  Patient fell on the residual limb about 2 weeks ago and has had progressive wound dehiscence. PMH includes asthma, CHF, CAD, HLD, HTN, DM2, obesity, connective tissue disease, chronic pain, R toe amputations (01/2020). 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   Clinical Impression   Pt admitted with the above diagnoses and presents with below problem list. Pt will benefit from continued acute OT to address the below listed deficits and maximize independence with basic ADLs prior to d/c home. Most recently pt has been able to complete transfers to access her w/c and BSC at home at min guard level. Pt currently needs min A for stand-pivot transfers, up to max A with LB ADLs in sit<>stand. Pt utilizes reacher and toilet aid at baseline.       Recommendations for follow up therapy are one component of a multi-disciplinary discharge planning process, led by the attending physician.  Recommendations may be updated based on patient status, additional functional criteria and insurance authorization.   Follow Up Recommendations  Home health OT;Supervision - Intermittent    Equipment Recommendations  None recommended by OT    Recommendations for Other Services       Precautions / Restrictions Precautions Precautions: Fall Precaution Comments: body habitus Required Braces or Orthoses: Other Brace Other Brace: limb protector Restrictions Weight Bearing Restrictions: Yes RLE Weight Bearing: Non weight bearing      Mobility Bed Mobility Overal bed mobility: Needs Assistance Bed Mobility: Supine to Sit     Supine to sit: Min guard     General bed mobility comments: extra time and effort, no physical assist.     Transfers Overall transfer level: Needs assistance Equipment used: Rolling walker (2 wheeled) Transfers: Sit to/from [employer] Sit to Stand: Min assist Stand pivot transfers: Min assist       General transfer comment: Assist to stabilize rw as pt needs to pull in rw with both hands. Assist to steady and control descent. EOB to pt's w/c.    Balance Overall balance assessment: Needs assistance Sitting-balance support: No upper extremity supported;Feet unsupported Sitting balance-[name]: Good     Standing balance support: Bilateral upper extremity supported Standing balance-[name]: Poor                             ADL either performed or assessed with clinical judgement   ADL Overall ADL's : Needs assistance/impaired Eating/Feeding: Independent   Grooming: Set up   Upper Body Bathing: Min guard;Set up   Lower Body Bathing: Maximal assistance;Sit to/from stand   Upper Body Dressing : Set up;Sitting   Lower Body Dressing: Maximal assistance;Sit to/from stand   Toilet Transfer: Minimal assistance;Stand-pivot;BSC;RW;Requires wide/bariatric             General ADL Comments: EOB to pt's w/c with min A and use of rw.     Vision         Perception     Praxis      Pertinent Vitals/Pain Pain Assessment: Faces Faces Pain Scale: Hurts even more Pain Location: episode of grimacing "cramping" low back to R residual limb Pain Descriptors / Indicators: Cramping;Grimacing Pain Intervention(s): Monitored during session;Limited activity within patient's tolerance;Repositioned     Hand  Dominance Right   Extremity/Trunk Assessment Upper Extremity Assessment Upper Extremity Assessment: Generalized weakness;Overall [facility] for tasks assessed (chronic joint pain)   Lower Extremity Assessment Lower Extremity Assessment: Defer to PT evaluation       Communication Communication Communication: No difficulties   Cognition  Arousal/Alertness: Awake/alert Behavior During Therapy: WFL for tasks assessed/performed Overall Cognitive Status: Within Functional Limits for tasks assessed                                     General Comments  Pt reports spouse is bringing w/c attachment for R residual limb later. Pt with R residual limb elevated on bed at end of session.    Exercises     Shoulder Instructions      Home Living Family/patient expects to be discharged to:: Private residence Living Arrangements: Spouse/significant other Available Help at Discharge: Family;Available 24 hours/day (husband) Type of Home: House Home Access: Ramped entrance     Home Layout: Two level;Able to live on main level with bedroom/bathroom Alternate Level Stairs-Number of Steps: flight Alternate Level Stairs-Rails: Left Bathroom Shower/Tub: Tub/shower unit   Bathroom Toilet: Handicapped height Bathroom Accessibility: Yes How Accessible: Accessible via walker Home Equipment: Tub bench;Bedside commode;Walker - 4 wheels;Walker - 2 wheels;Wheelchair - manual   Additional Comments: Uses BSC and places it near the toilet.  Lives With: Spouse    Prior Functioning/Environment Level of Independence: Independent;Needs assistance  Gait / Transfers Assistance Needed: since home from CIR 5 days ago has been able to SPT to access [facility] and w/c. ADL's / Homemaking Assistance Needed: Husband does majority of cooking and cleaning, will assist with back and shoes/socks on occasion.   Comments: sleeps in recliner since last admission        OT Problem List: Decreased strength;Decreased activity tolerance;Impaired balance (sitting and/or standing);Decreased knowledge of precautions;Decreased knowledge of use of DME or AE;Obesity;Pain      OT Treatment/Interventions: [name]/ADL training;Therapeutic exercise;DME and/or AE instruction;Balance training;Patient/family education;Therapeutic activities    OT Goals(Current  goals can be found in the care plan section) Acute Rehab OT Goals Patient Stated Goal: home OT Goal Formulation: With patient Time For Goal Achievement: 07/14/21 Potential to Achieve Goals: Good ADL Goals Pt Will Perform Lower Body Bathing: with min guard assist;sitting/lateral leans;sit to/from stand Pt Will Perform Lower Body Dressing: with min guard assist;sitting/lateral leans;sit to/from stand Pt Will Transfer to Toilet: with min guard assist;stand pivot transfer;squat pivot transfer Pt Will Perform Toileting - Clothing Manipulation and hygiene: with min guard assist;sitting/lateral leans;with adaptive equipment  OT Frequency: Min 2X/week   Barriers to D/C:            Co-evaluation              AM-PAC OT "6 Clicks" Daily Activity     Outcome Measure Help from another person eating meals?: None Help from another person taking care of personal grooming?: None Help from another person toileting, which includes using toliet, bedpan, or urinal?: A Little Help from another person bathing (including washing, rinsing, drying)?: A Little Help from another person to put on and taking off regular upper body clothing?: None Help from another person to put on and taking off regular lower body clothing?: A Little 6 Click Score: 21   End of Session Equipment Utilized During Treatment: Rolling walker  Activity Tolerance: Patient tolerated treatment well Patient left: with call bell/phone within reach;Other (comment) (in w/c with  R residual limb elevated on bed)  OT Visit Diagnosis: Other abnormalities of gait and mobility (R26.89);Muscle weakness (generalized) (M62.81);Pain;History of falling (Z91.81)                Time: 0938-1829 OT Time Calculation (min): 36 min Charges:  OT General Charges $OT Visit: 1 Visit OT Evaluation $OT Eval Moderate Complexity: 1 Mod OT Treatments $Self Care/Home Management : 8-22 mins  Raynald Kemp, OT Acute Rehabilitation Services Pager:  [phone] Office: [phone]   Pilar Grammes 06/30/2021, 10:31 AM

## 2021-06-30 NOTE — Anesthesia Postprocedure Evaluation (Signed)
Anesthesia Post Note  Patient: [name]  Procedure(s) Performed: REVISION RIGHT BELOW KNEE AMPUTATION (Right: Leg Lower)     Patient location during evaluation: PACU Anesthesia Type: General Level of consciousness: awake and alert Pain management: pain level controlled Vital Signs Assessment: post-procedure vital signs reviewed and stable Respiratory status: spontaneous breathing, nonlabored ventilation, respiratory function stable and patient connected to nasal cannula oxygen Cardiovascular status: blood pressure returned to baseline and stable Postop Assessment: no apparent nausea or vomiting Anesthetic complications: no   No notable events documented.  Last Vitals:  Vitals:   06/30/21 1108 06/30/21 1619  BP: 115/61 (!) 102/57  Pulse: 91 78  Resp: 16 16  Temp: 36.8 C 36.5 C  SpO2: 98% 98%    Last Pain:  Vitals:   06/30/21 1714  TempSrc:   PainSc: 8                  Tavonna Worthington

## 2021-06-30 NOTE — Progress Notes (Signed)
Inpatient Rehabilitation Admissions Coordinator   Home Health is recommended. No need to readmit to CIR at this time. We will sign off. Acute team and TOC made aware.  Ottie Glazier, RN, MSN Rehab Admissions Coordinator [phone] 06/30/2021 4:08 PM

## 2021-06-30 NOTE — Evaluation (Signed)
Physical Therapy Evaluation Patient Details Name: [name] MRN: [id number] DOB: Apr 30, 1961 Today's Date: 06/30/2021  History of Present Illness  Pt is 60 yo female who presented with dehiscence of right transtibial amputation surgery (surgery 06/01/21).  Patient fell on the residual limb about 2 weeks ago and has had progressive wound dehiscence. PMH includes asthma, CHF, CAD, HLD, HTN, DM2, obesity, connective tissue disease, chronic pain, R toe amputations (01/2020). 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  Clinical Impression  Pt is asking about transition to home, and discussed the reasoning with PT.  Pt is able to do a pivot or stand with min assist, and husband can be available to help her with this.  Pt has been through rehab, and now is making good progress with bed to chair transfers, and back.  Her wc is a drop arm that gets pt to a good height for sliding or stand pivot on RW to bed/chair.   Follow for goals of PT and work on her control of sit to stand, of standing balance and her awareness of safety with all movement.  Pt is clear that she has a specific sequence that she needs to use for safety with standing, and mainly per this PT struggles with controlling descent to chair.  Will anticipate her focus on the goals planned and to get a return on all demonstration of safety with moving.       Recommendations for follow up therapy are one component of a multi-disciplinary discharge planning process, led by the attending physician.  Recommendations may be updated based on patient status, additional functional criteria and insurance authorization.  Follow Up Recommendations Home health PT;Supervision/Assistance - 24 hour;Supervision for mobility/OOB    Equipment Recommendations  Wheelchair (measurements PT);Wheelchair cushion (measurements PT);Other (comment) (drop arm BSC)    Recommendations for Other Services OT consult     Precautions / Restrictions  Precautions Precautions: Fall Precaution Comments: body habitus Required Braces or Orthoses: Other Brace Other Brace: limb protector Restrictions Weight Bearing Restrictions: Yes RLE Weight Bearing: Non weight bearing      Mobility  Bed Mobility               General bed mobility comments: up in chair when PT arrived    Transfers Overall transfer level: Needs assistance Equipment used: Rolling walker (2 wheeled) Transfers: Sit to/from [employer] Sit to Stand: Min assist Stand pivot transfers: Min assist      Lateral/Scoot Transfers: Min guard General transfer comment: pt is aware of her routine to stand and  turn but needs help to keep walker steady but free for her to shift and pivot to sit on bed  Ambulation/Gait                Stairs            Wheelchair Mobility    Modified Rankin (Stroke Patients Only)       Balance Overall balance assessment: Needs assistance Sitting-balance support: Single extremity supported Sitting balance-[name]: Good     Standing balance support: Bilateral upper extremity supported;During functional activity Standing balance-[name]: Poor Standing balance comment: uses RW with practiced effort                             Pertinent Vitals/Pain Pain Assessment: Faces Faces Pain Scale: Hurts little more Pain Location: pain is managed with elevation of RLE and slow lowering to stand Pain  Descriptors / Indicators: Grimacing;Guarding Pain Intervention(s): Limited activity within patient's tolerance;Monitored during session;Premedicated before session;Repositioned    Home Living Family/patient expects to be discharged to:: Private residence Living Arrangements: Spouse/significant other Available Help at Discharge: Family;Available 24 hours/day Type of Home: House Home Access: Ramped entrance     Home Layout: Two level;Able to live on main level with bedroom/bathroom Home  Equipment: Tub bench;Bedside commode;Walker - 4 wheels;Walker - 2 wheels;Wheelchair - manual Additional Comments: Uses BSC and places it near the toilet.    Prior Function Level of Independence: Independent;Needs assistance   Gait / Transfers Assistance Needed: since home from CIR 5 days ago has been able to SPT to access [facility] and w/c.  ADL's / Homemaking Assistance Needed: husband is doing cooking and cleaning, pt in wc level at home        Hand Dominance   Dominant Hand: Right    Extremity/Trunk Assessment   Upper Extremity Assessment Upper Extremity Assessment: Defer to OT evaluation    Lower Extremity Assessment Lower Extremity Assessment: RLE deficits/detail RLE Deficits / Details: BKA RLE: Unable to fully assess due to immobilization RLE Coordination: decreased gross motor    Cervical / Trunk Assessment Cervical / Trunk Assessment: Normal  Communication   Communication: No difficulties  Cognition Arousal/Alertness: Awake/alert Behavior During Therapy: WFL for tasks assessed/performed Overall Cognitive Status: Within Functional Limits for tasks assessed Area of Impairment: Safety/judgement;Awareness;Problem solving                         Safety/Judgement: Decreased awareness of deficits Awareness: Intellectual Problem Solving: Requires verbal cues General Comments: has asked to go home and verbalizes needing to use sequence that works for her      General Comments General comments (skin integrity, edema, etc.): Pt is up to side of bed with help from wheelchair, able to pivot on RW with hop and shift to sit side of bed    Exercises     Assessment/Plan    PT Assessment Patient needs continued PT services  PT Problem List Decreased strength;Decreased activity tolerance;Obesity;Decreased knowledge of use of DME;Decreased knowledge of precautions;Decreased balance;Decreased mobility;Decreased coordination;Decreased safety awareness;Pain;Decreased skin  integrity       PT Treatment Interventions DME instruction;Gait training;Functional mobility training;Therapeutic activities;Therapeutic exercise;Balance training;Neuromuscular re-education;Patient/family education    PT Goals (Current goals can be found in the Care Plan section)  Acute Rehab PT Goals Patient Stated Goal: to go home PT Goal Formulation: With patient Time For Goal Achievement: 07/14/21 Potential to Achieve Goals: Good    Frequency Min 4X/week   Barriers to discharge Decreased caregiver support home with husband who is going to be available as needed    Co-evaluation               AM-PAC PT "6 Clicks" Mobility  Outcome Measure Help needed turning from your back to your side while in a flat bed without using bedrails?: A Little Help needed moving from lying on your back to sitting on the side of a flat bed without using bedrails?: A Little Help needed moving to and from a bed to a chair (including a wheelchair)?: A Lot Help needed standing up from a chair using your arms (e.g., wheelchair or bedside chair)?: A Lot Help needed to walk in hospital room?: Total Help needed climbing 3-5 steps with a railing? : Total 6 Click Score: 12    End of Session Equipment Utilized During Treatment: Gait belt Activity Tolerance: Patient limited by  fatigue;Other (comment) (LE strength) Patient left: in bed;with call bell/phone within reach;with bed alarm set Nurse Communication: Mobility status (pt has had falls since rehab in CIR) PT Visit Diagnosis: Unsteadiness on feet (R26.81);Other abnormalities of gait and mobility (R26.89);Muscle weakness (generalized) (M62.81);Pain Pain - Right/Left: Right Pain - part of body: Leg    Time: 1129-1201 PT Time Calculation (min) (ACUTE ONLY): 32 min   Charges:   PT Evaluation $PT Eval Moderate Complexity: 1 Mod PT Treatments $Therapeutic Activity: 8-22 mins       Ivar Drape 06/30/2021, 3:58 PM  Samul Dada, PT PhD Acute  Rehab Dept. Number: [facility] [id number] and [facility] [phone]

## 2021-06-30 NOTE — Progress Notes (Signed)
Initial Nutrition Assessment  DOCUMENTATION CODES:   Morbid obesity  INTERVENTION:   -D/c Juven -MVI with minerals daily -Double protein portions with meals -Magic cup TID with meals, each supplement provides 290 kcal and 9 grams of protein   NUTRITION DIAGNOSIS:   Increased nutrient needs related to wound healing as evidenced by estimated needs.  GOAL:   Patient will meet greater than or equal to 90% of their needs  MONITOR:   PO intake, Supplement acceptance, Labs, Weight trends, Skin, I & O's  REASON FOR ASSESSMENT:   Rounds    ASSESSMENT:   Patient is a 60 year old woman who is status post a right transtibial amputation.  Patient fell on the residual limb about 2 weeks ago and has had progressive wound dehiscence.  Patient has had increasing redness and swelling.  Pt admitted with rt transtibial amputation dehiscence.   10/19- s/p REVISION RIGHT BELOW KNEE AMPUTATION  Reviewed I/O's: +841 ml x 24 hours  Pt talking on phone at time of visit.   Pt with good appetite; noted meal completion 100%. Pt is refusing Juven supplements.   Reviewed wt hx; noted pt has experienced a 12.2% wt loss over the past month, which is significant for time frame. Suspect wt loss is related to amputation.    Medications reviewed and include vitamin C, cardizem, colace, and zinc sulfate.   Obesity is a complex, chronic medical condition that is optimally managed by a multidisciplinary care team. Weight loss is not an ideal goal for an acute inpatient hospitalization. However, if further work-up for obesity is warranted, consider outpatient referral to outpatient bariatric service and/or [facility].    Lab Results  Component Value Date   HGBA1C 7.3 (H) 05/27/2021   PTA DM medications are 20-150 units insulin regular concentrated TID with meals and 500 mg metformin BID.   Labs reviewed: K: 5.7, CBGS: 223-292 (inpatient orders for glycemic  control are 1000 mg metformin BID, 0-20 units insulin aspart TID with meals, 10 units insulin aspart TID with meals, and 27 units insulin detemir daily at bedtime).    NUTRITION - FOCUSED PHYSICAL EXAM:  Flowsheet Row Most Recent Value  Orbital Region No depletion  Upper Arm Region No depletion  Thoracic and Lumbar Region No depletion  Buccal Region No depletion  Temple Region No depletion  Clavicle Bone Region No depletion  Clavicle and Acromion Bone Region No depletion  Scapular Bone Region No depletion  Dorsal Hand No depletion  Patellar Region No depletion  Anterior Thigh Region No depletion  Posterior Calf Region No depletion  Edema (RD Assessment) Mild  Hair Reviewed  Eyes Reviewed  Mouth Reviewed  Skin Reviewed  Nails Reviewed       Diet Order:   Diet Order             Diet Carb Modified Fluid consistency: Thin; Room service appropriate? Yes  Diet effective now                   EDUCATION NEEDS:   No education needs have been identified at this time  Skin:  Skin Assessment: Skin Integrity Issues: Skin Integrity Issues:: Other (Comment), Incisions, Wound VAC Wound Vac: s/p rt BKA revision Diabetic Ulcer: - Incisions: lt breast and upper lt labia incisions Other: non pressure wound to rt lumbar area;  Last BM:  06/30/21  Height:   Ht Readings from Last 1 Encounters:  06/29/21 5\' 2"  (1.575 m)    Weight:  Wt Readings from Last 1 Encounters:  06/29/21 136.1 kg   BMI:  Body mass index is 54.87 kg/m.  Estimated Nutritional Needs:   Kcal:  1800-2000  Protein:  110-125 grams  Fluid:  > 1.8 L    Levada Schilling, RD, LDN, CDCES Registered Dietitian II Certified Diabetes Care and Education Specialist Please refer to [facility] for RD and/or RD on-call/weekend/after hours pager

## 2021-07-01 LAB — BASIC METABOLIC PANEL
Anion gap: 7 (ref 5–15)
BUN: 25 mg/dL — ABNORMAL HIGH (ref 6–20)
CO2: 22 mmol/L (ref 22–32)
Calcium: 9 mg/dL (ref 8.9–10.3)
Chloride: 106 mmol/L (ref 98–111)
Creatinine, Ser: 1.06 mg/dL — ABNORMAL HIGH (ref 0.44–1.00)
GFR, Estimated: >60 mL/min (ref >60)
Glucose, Bld: 238 mg/dL — ABNORMAL HIGH (ref 70–99)
Potassium: 5.1 mmol/L (ref 3.5–5.1)
Sodium: 135 mmol/L (ref 135–145)

## 2021-07-01 LAB — CBC
HCT: 28.7 % — ABNORMAL LOW (ref 36.0–46.0)
Hemoglobin: 8.4 g/dL — ABNORMAL LOW (ref 12.0–15.0)
MCH: 25.2 pg — ABNORMAL LOW (ref 26.0–34.0)
MCHC: 29.3 g/dL — ABNORMAL LOW (ref 30.0–36.0)
MCV: 86.2 fL (ref 80.0–100.0)
Platelets: 236 10*3/uL (ref 150–400)
RBC: 3.33 MIL/uL — ABNORMAL LOW (ref 3.87–5.11)
RDW: 17 % — ABNORMAL HIGH (ref 11.5–15.5)
WBC: 10.5 10*3/uL (ref 4.0–10.5)
nRBC: 0 % (ref 0.0–0.2)

## 2021-07-01 LAB — GLUCOSE, CAPILLARY
Glucose-Capillary: 220 mg/dL — ABNORMAL HIGH (ref 70–99)
Glucose-Capillary: 171 mg/dL — ABNORMAL HIGH (ref 70–99)

## 2021-07-01 LAB — SURGICAL PATHOLOGY

## 2021-07-01 NOTE — Progress Notes (Signed)
Pt had DC order. DC paper was discussed with pt and husband. Wound vac was switch to [employer]. Questions has been answered.

## 2021-07-01 NOTE — Discharge Summary (Signed)
Discharge Diagnoses:  Active Problems:   Dehiscence of amputation stump (HCC)   Hx of BKA, right ([place])   Surgeries: Procedure(s): REVISION RIGHT BELOW KNEE AMPUTATION on 06/29/2021    Consultants:   Discharged Condition: Improved  Hospital Course: [name] is an 60 y.o. female who was admitted 06/29/2021 with a chief complaint of dehiscence right transtibial amputation, with a final diagnosis of Pre-op diagnosis: Dehiscence/Necrosis Right Below Knee Amputation.  Patient was brought to the operating room on 06/29/2021 and underwent Procedure(s): REVISION RIGHT BELOW KNEE AMPUTATION.    Patient was given perioperative antibiotics:  Anti-infectives (From admission, onward)    Start     Dose/Rate Route Frequency Ordered Stop   06/29/21 1400  hydroxychloroquine (PLAQUENIL) tablet 200 mg        200 mg Oral 2 times daily 06/29/21 1301     06/29/21 1400  ceFAZolin (ANCEF) IVPB 2g/100 mL premix        2 g 200 mL/hr over 30 Minutes Intravenous Every 8 hours 06/29/21 1301 06/29/21 2243   06/29/21 0830  ceFAZolin (ANCEF) IVPB 3g/100 mL premix        3 g 200 mL/hr over 30 Minutes Intravenous On call to O.R. 06/29/21 0819 06/29/21 1057   06/29/21 0749  ceFAZolin (ANCEF) 2-4 GM/100ML-% IVPB       Note to Pharmacy: Alba Cory   : cabinet override      06/29/21 0749 06/29/21 1959   06/29/21 0745  ceFAZolin (ANCEF) IVPB 2g/100 mL premix  Status:  Discontinued        2 g 200 mL/hr over 30 Minutes Intravenous  Once 06/29/21 0747 06/29/21 0822     .  Patient was given sequential compression devices, early ambulation, and aspirin for DVT prophylaxis.  Recent vital signs: Patient Vitals for the past 24 hrs:  BP Temp Temp src Pulse Resp SpO2  07/01/21 0804 (!) 146/68 (!) 97.3 F (36.3 C) Oral 88 16 97 %  06/30/21 2155 (!) 115/58 97.7 F (36.5 C) Oral 80 18 --  06/30/21 1619 (!) 102/57 97.7 F (36.5 C) Oral 78 16 98 %  06/30/21 1108 115/61 98.3 F (36.8 C) Oral 91 16 98 %   .  Recent laboratory studies: No results found.  Discharge Medications:   Allergies as of 07/01/2021       Reactions   Fish Allergy Anaphylaxis   INCLUDES OMEGA 3 OILS   Fish Oil Anaphylaxis, Swelling   THROAT SWELLS INCLUDES FISH AS A CLASS   Omega-3 Fatty Acids Swelling   Throat swelling  Has tolerated Glucerna nutritional drinks   Crestor [rosuvastatin] Other (See Comments)   Extreme joint pain, to this & other statins   Gabapentin Anxiety   Anxiety on high doses   Lovastatin Other (See Comments)   EXTREME JOINT PAIN   Metronidazole Nausea Only, Swelling   Headache and shakes Nausea and vomiting   Red Yeast Rice [cholestin] Other (See Comments)   Muscle pain and severe joint pain.    Rotigotine Swelling   (Neupro) Extreme edema   Atrovent Hfa [ipratropium Bromide Hfa] Other (See Comments)   wheezing   Iodine Swelling   Other Other (See Comments)   Surgical Staples causes redness/infection per patient.   Pepto-bismol [bismuth Subsalicylate] Nausea And Vomiting   Extreme vomiting   Red Yeast Rice Extract    Joint pain   Victoza [liraglutide]    Unsure of reaction    Enalapril Cough   Suprep [na Sulfate-k Sulfate-mg Sulf]  Nausea And Vomiting   Tape Other (See Comments)   Electrodes causes skin breakdown        Medication List     TAKE these medications    Acetaminophen Extra Strength 500 MG tablet Generic drug: acetaminophen Take 1 tablet (500 mg total) by mouth 4 (four) times daily -  with meals and at bedtime.   albuterol (2.5 MG/3ML) 0.083% nebulizer solution Commonly known as: PROVENTIL USE 1 VIAL IN NEBULIZER EVERY 4 HOURS AS NEEDED FOR WHEEZING OR  FOR  SHORTNESS  OF  BREATH What changed: See the new instructions.   aspirin EC 81 MG tablet Take 81 mg by mouth every evening.   betamethasone dipropionate 0.05 % cream Apply 1 application topically 2 (two) times daily as needed (extreme itching).   camphor-menthol lotion Commonly known as:  SARNA Apply topically as needed for itching.   Cartia XT 240 MG 24 hr capsule Generic drug: diltiazem Take 1 capsule (240 mg total) by mouth daily. What changed: when to take this   cephALEXin 500 MG capsule Commonly known as: KEFLEX Take 1 capsule (500 mg total) by mouth every 6 (six) hours.   cyclobenzaprine 10 MG tablet Commonly known as: FLEXERIL Take 1 tablet (10 mg total) by mouth 3 (three) times daily as needed for muscle spasms.   diclofenac Sodium 1 % Gel Commonly known as: VOLTAREN Apply 2 g topically 4 (four) times daily. What changed: when to take this   diphenhydrAMINE 25 MG tablet Commonly known as: BENADRYL Take 25 mg by mouth daily as needed for allergies or itching.   EPINEPHrine 0.3 mg/0.3 mL Soaj injection Commonly known as: EpiPen 2-Pak USE AS DIRECTED FOR SEVERE ALLERGIC REACTION. What changed:  how much to take how to take this when to take this reasons to take this additional instructions   esomeprazole 20 MG capsule Commonly known as: NEXIUM Take 40 mg by mouth in the morning.   ezetimibe 10 MG tablet Commonly known as: ZETIA Take 1 tablet (10 mg total) by mouth daily. What changed: when to take this   fluconazole 150 MG tablet Commonly known as: DIFLUCAN Take 150 mg by mouth every 3 (three) days.   fluticasone 50 MCG/ACT nasal spray Commonly known as: FLONASE USE 2 SPRAY(S) IN EACH NOSTRIL ONCE DAILY AS NEEDED FOR  ALLERGIES  OR  RHINITIS What changed: See the new instructions.   folic acid 035 MCG tablet Commonly known as: FOLVITE Take 1,600 mcg by mouth daily at 12 noon.   furosemide 20 MG tablet Commonly known as: LASIX Take 20-40 mg by mouth 3 (three) times daily as needed (fluid retention/swelling).   gabapentin 400 MG capsule Commonly known as: NEURONTIN Take 1 capsule by mouth in the morning, 1 capsule at noon, and 2 capsules at bed time.   GLUCOMANNAN PO Take 1,995 mg by mouth in the morning, at noon, and at bedtime.    glucose blood test strip 1 each by Other route 4 (four) times daily. Contour Next strips   HUMULIN R 500 UNIT/ML injection Generic drug: insulin regular human CONCENTRATED Inject 0.08-0.3 mLs (40-150 Units total) into the skin 3 (three) times daily with meals. What changed: how much to take   Hydrocortisone Max St 1 % Generic drug: hydrocortisone cream Apply 1 application topically 3 (three) times daily as needed for itching (minor skin irritation).   hydroxychloroquine 200 MG tablet Commonly known as: PLAQUENIL Take 1 tablet (200 mg total) by mouth 2 (two) times daily.  INSULIN SYRINGE 1CC/29G 29G X 1/2" 1 ML Misc Commonly known as: B-D INSULIN SYRINGE Use three times daily with insulin.  DX E11.9   isosorbide mononitrate 30 MG 24 hr tablet Commonly known as: IMDUR Take 1 tablet (30 mg total) by mouth daily. What changed: when to take this   levocetirizine 5 MG tablet Commonly known as: XYZAL TAKE 1 TABLET BY MOUTH ONCE DAILY IN THE EVENING   levothyroxine 200 MCG tablet Commonly known as: SYNTHROID TAKE 1 TABLET BY MOUTH ONCE DAILY BEFORE BREAKFAST   lidocaine 5 % Commonly known as: LIDODERM Place 1 patch onto the skin daily. Remove & Discard patch within 12 hours or as directed by MD   metFORMIN 500 MG 24 hr tablet Commonly known as: GLUCOPHAGE-XR Take 2 tablets by mouth twice daily   methocarbamol 750 MG tablet Commonly known as: ROBAXIN Take 750 mg by mouth every 8 (eight) hours as needed for muscle spasms.   mupirocin ointment 2 % Commonly known as: BACTROBAN Apply 1 application topically 2 (two) times daily as needed (wound care).   N-Acetyl Cysteine 600 MG Tabs Generic drug: Acetylcysteine (Nutrient) Take 600 mg by mouth in the morning and at bedtime.   naloxone 4 MG/0.1ML Liqd nasal spray kit Commonly known as: NARCAN Place 1 spray into the nose as needed (opoid overdose).   nitroGLYCERIN 0.4 MG SL tablet Commonly known as: NITROSTAT DISSOLVE ONE  TABLET UNDER THE TONGUE EVERY 5 MINUTES AS NEEDED FOR CHEST PAIN.  DO NOT EXCEED A TOTAL OF 3 DOSES IN 15 MINUTES What changed: See the new instructions.   Oxycodone HCl 10 MG Tabs Take 1 tablet (10 mg total) by mouth every 8 (eight) hours as needed (pain). What changed: when to take this   oxymetazoline 0.05 % nasal spray Commonly known as: AFRIN Place 2 sprays into both nostrils 2 (two) times daily.   POT BICARB-POT CHLORIDE(25MEQ) 25 MEQ Tbef Take 25 mEq by mouth 3 (three) times daily as needed (with lasix).   pramipexole 1 MG tablet Commonly known as: MIRAPEX Take 1 tablet (1 mg total) by mouth 3 (three) times daily. What changed:  when to take this additional instructions   pregabalin 25 MG capsule Commonly known as: LYRICA Take 1 capsule (25 mg total) by mouth at bedtime.   promethazine 25 MG tablet Commonly known as: PHENERGAN TAKE 1 TABLET BY MOUTH EVERY 8 HOURS AS NEEDED FOR NAUSEA AND FOR VOMITING What changed:  how much to take when to take this reasons to take this additional instructions   saccharomyces boulardii 250 MG capsule Commonly known as: FLORASTOR Take 250 mg by mouth 3 (three) times daily.   Santyl ointment Generic drug: collagenase Apply 1 application topically daily.   tiZANidine 2 MG tablet Commonly known as: ZANAFLEX Take 1 tablet (2 mg total) by mouth 3 (three) times daily.   traMADol 50 MG tablet Commonly known as: ULTRAM Take 1 tablet (50 mg total) by mouth every 6 (six) hours as needed for moderate pain.   valsartan 160 MG tablet Commonly known as: DIOVAN Take 1 tablet (160 mg total) by mouth daily. What changed: when to take this   vitamin B-12 1000 MCG tablet Commonly known as: CYANOCOBALAMIN Take 1,000 mcg by mouth in the morning.   vitamin C 500 MG tablet Commonly known as: ASCORBIC ACID Take 1 tablet (500 mg total) by mouth 2 (two) times daily.   Vitamin D (Ergocalciferol) 1.25 MG (50000 UNIT) Caps capsule Commonly  known as:  DRISDOL TAKE 1 CAPSULE BY MOUTH ONCE A WEEK (EVERY  7  DAYS)  TAKE  ON  FRIDAY What changed:  how much to take how to take this when to take this   Zinc Sulfate 220 (50 Zn) MG Tabs Take 1 tablet (220 mg total) by mouth daily. What changed: when to take this        Diagnostic Studies: ECHO TEE  Result Date: 06/01/2021    TRANSESOPHOGEAL ECHO REPORT   Patient Name:   [name] Date of Exam: 06/01/2021 Medical Rec #:  [id number]      Height:       62.0 in Accession #:    [id number]     Weight:       330.0 lb Date of Birth:  20-Jul-1961     BSA:          2.365 m Patient Age:    49 years       BP:           134/81 mmHg Patient Gender: F              HR:           72 bpm. Exam Location:  Inpatient Procedure: Transesophageal Echo, Color Doppler and Cardiac Doppler Indications:     Bacteremia  History:         Patient has prior history of Echocardiogram examinations, most                  recent 05/29/2021. CHF, CAD; Risk Factors:Dyslipidemia,                  Hypertension and Diabetes.  Sonographer:     Bernadene Person RDCS Referring Phys:  [id number] Darreld Mclean Diagnosing Phys: Eleonore Chiquito MD PROCEDURE: After discussion of the risks and benefits of a TEE, an informed consent was obtained from the patient. The transesophogeal probe was passed without difficulty through the esophogus of the patient. Sedation performed by different physician. The patient was monitored while under deep sedation. The patient's vital signs; including heart rate, blood pressure, and oxygen saturation; remained stable throughout the procedure. The patient developed no complications during the procedure. IMPRESSIONS  1. Left ventricular ejection fraction, by estimation, is 60 to 65%. The left ventricle has normal function. The left ventricle has no regional wall motion abnormalities.  2. Right ventricular systolic function is normal. The right ventricular size is normal.  3. No left atrial/left atrial appendage  thrombus was detected.  4. The mitral valve is grossly normal. Trivial mitral valve regurgitation. No evidence of mitral stenosis.  5. The aortic valve is tricuspid. Aortic valve regurgitation is not visualized. Mild aortic valve sclerosis is present, with no evidence of aortic valve stenosis.  6. There is mild (Grade II) layered plaque involving the descending aorta and transverse aorta. Conclusion(s)/Recommendation(s): No evidence of vegetation/infective endocarditis on this transesophageal echocardiogram. FINDINGS  Left Ventricle: Left ventricular ejection fraction, by estimation, is 60 to 65%. The left ventricle has normal function. The left ventricle has no regional wall motion abnormalities. The left ventricular internal cavity size was normal in size. Right Ventricle: The right ventricular size is normal. No increase in right ventricular wall thickness. Right ventricular systolic function is normal. Left Atrium: Left atrial size was normal in size. No left atrial/left atrial appendage thrombus was detected. Right Atrium: Right atrial size was normal in size. Pericardium: Trivial pericardial effusion is present. Mitral Valve: The mitral valve is grossly normal. Trivial  mitral valve regurgitation. No evidence of mitral valve stenosis. There is no evidence of mitral valve vegetation. Tricuspid Valve: The tricuspid valve is grossly normal. Tricuspid valve regurgitation is trivial. No evidence of tricuspid stenosis. There is no evidence of tricuspid valve vegetation. Aortic Valve: The aortic valve is tricuspid. Aortic valve regurgitation is not visualized. Mild aortic valve sclerosis is present, with no evidence of aortic valve stenosis. There is no evidence of aortic valve vegetation. Pulmonic Valve: The pulmonic valve was grossly normal. Pulmonic valve regurgitation is not visualized. No evidence of pulmonic stenosis. There is no evidence of pulmonic valve vegetation. Aorta: The aortic root and ascending aorta  are structurally normal, with no evidence of dilitation. There is mild (Grade II) layered plaque involving the descending aorta and transverse aorta. Venous: The right upper pulmonary vein, right lower pulmonary vein, left lower pulmonary vein and left upper pulmonary vein are normal. IAS/Shunts: The interatrial septum appears to be lipomatous. No atrial level shunt detected by color flow Doppler.   AORTA Ao Root diam: 2.72 cm Ao Asc diam:  3.20 cm Eleonore Chiquito MD Electronically signed by Eleonore Chiquito MD Signature Date/Time: 06/01/2021/4:43:32 PM    Final     Patient benefited maximally from their hospital stay and there were no complications.     Disposition: Discharge disposition: 01-Home or Self Care     Discharge Instructions     Call MD / Call 911   Complete by: As directed    If you experience chest pain or shortness of breath, CALL 911 and be transported to the hospital emergency room.  If you develope a fever above 101 F, pus (white drainage) or increased drainage or redness at the wound, or calf pain, call your surgeon's office.   Constipation Prevention   Complete by: As directed    Drink plenty of fluids.  Prune juice may be helpful.  You may use a stool softener, such as Colace (over the counter) 100 mg twice a day.  Use MiraLax (over the counter) for constipation as needed.   Diet - low sodium heart healthy   Complete by: As directed    Increase activity slowly as tolerated   Complete by: As directed    Post-operative opioid taper instructions:   Complete by: As directed    POST-OPERATIVE OPIOID TAPER INSTRUCTIONS: It is important to wean off of your opioid medication as soon as possible. If you do not need pain medication after your surgery it is ok to stop day one. Opioids include: Codeine, Hydrocodone(Norco, Vicodin), Oxycodone(Percocet, oxycontin) and hydromorphone amongst others.  Long term and even short term use of opiods can cause: Increased pain  response Dependence Constipation Depression Respiratory depression And more.  Withdrawal symptoms can include Flu like symptoms Nausea, vomiting And more Techniques to manage these symptoms Hydrate well Eat regular healthy meals Stay active Use relaxation techniques(deep breathing, meditating, yoga) Do Not substitute Alcohol to help with tapering If you have been on opioids for less than two weeks and do not have pain than it is ok to stop all together.  Plan to wean off of opioids This plan should start within one week post op of your joint replacement. Maintain the same interval or time between taking each dose and first decrease the dose.  Cut the total daily intake of opioids by one tablet each day Next start to increase the time between doses. The last dose that should be eliminated is the evening dose.  Follow-up Information     Newt Minion, MD Follow up in 1 week(s).   Specialty: Orthopedic Surgery Contact information: [place] [address] [place], [place] Follow up.   Specialty: [place] Why: home health servicee will be provided by [facility] information: [address] [place] [address] [phone]                  Signed: Newt Minion 07/01/2021, 9:24 AM

## 2021-07-01 NOTE — Progress Notes (Signed)
Physical Therapy Treatment Patient Details Name: [name] MRN: [id number] DOB: 09/08/1961 Today's Date: 07/01/2021   History of Present Illness Pt is 60 yo female who presented with dehiscence of right transtibial amputation surgery (surgery 06/01/21).  Patient fell on the residual limb about 2 weeks ago and has had progressive wound dehiscence. PMH includes asthma, CHF, CAD, HLD, HTN, DM2, obesity, connective tissue disease, chronic pain, R toe amputations (01/2020). 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    PT Comments    Pt sitting EOB finishing breakfast upon PT arrival to room. Focus of session was on repeated transfers to simulate home set up, pt tolerating x3 transfers within session and no physical assist needed. Pt requires some safety cues for safety during transfers, like not placing both hands on RW to pull self up, but otherwise pt doing very well. PT reviewed the importance of energy conservation once home to prevent excessive fatigue and consequently falls. Pt states she struggles with listening to her fatigue, PT encouraged pt to spread out transfers throughout the day and avoid overworking self in the morning. Pt and husband express understanding, pt eager to d/c home.     Recommendations for follow up therapy are one component of a multi-disciplinary discharge planning process, led by the attending physician.  Recommendations may be updated based on patient status, additional functional criteria and insurance authorization.  Follow Up Recommendations  Home health PT;Supervision for mobility/OOB     Equipment Recommendations  Other (comment) (drop arm BSC)    Recommendations for Other Services OT consult     Precautions / Restrictions Precautions Precautions: Fall Required Braces or Orthoses: Other Brace Other Brace: limb protector Restrictions RLE Weight Bearing: Non weight bearing     Mobility  Bed Mobility               General bed mobility  comments: sitting EOB    Transfers Overall transfer level: Needs assistance Equipment used: Rolling walker (2 wheeled) Transfers: Sit to/from [employer] Sit to Stand: Supervision Stand pivot transfers: Supervision       General transfer comment: for safety, cues for safe hand placement when rising/sitting, pivotal steps to reach destination surfaces, and hand guidance needed when moving stand/sit to armrests of wheelchair/BSC. STS x3, from EOB, wheelchair, and BSC.  Ambulation/Gait             General Gait Details: NT - pivotal steps only during stand pivots   Stairs             Wheelchair Mobility    Modified Rankin (Stroke Patients Only)       Balance Overall balance assessment: Needs assistance   Sitting balance-[name]: Good     Standing balance support: Bilateral upper extremity supported;During functional activity Standing balance-[name]: Poor Standing balance comment: reliant on external support                            Cognition Arousal/Alertness: Awake/alert Behavior During Therapy: WFL for tasks assessed/performed Overall Cognitive Status: Within Functional Limits for tasks assessed Area of Impairment: Safety/judgement                         Safety/Judgement: Decreased awareness of deficits     General Comments: Pt lacks some insight into energy conservation and need for conserving energy throughout the day, pt states scenarios of falls are related to feeling too fatigued  to transfer but she tries anyway      Exercises      General Comments        Pertinent Vitals/Pain Pain Assessment: Faces Faces Pain Scale: Hurts a little bit Pain Location: RLE Pain Descriptors / Indicators: Discomfort Pain Intervention(s): Monitored during session    Home Living                      Prior Function            PT Goals (current goals can now be found in the care plan section) Acute  Rehab PT Goals Patient Stated Goal: to go home PT Goal Formulation: With patient Time For Goal Achievement: 07/14/21 Potential to Achieve Goals: Good Progress towards PT goals: Progressing toward goals    Frequency    Min 4X/week      PT Plan Current plan remains appropriate    Co-evaluation              AM-PAC PT "6 Clicks" Mobility   Outcome Measure  Help needed turning from your back to your side while in a flat bed without using bedrails?: A Little Help needed moving from lying on your back to sitting on the side of a flat bed without using bedrails?: A Little Help needed moving to and from a bed to a chair (including a wheelchair)?: A Little Help needed standing up from a chair using your arms (e.g., wheelchair or bedside chair)?: A Little Help needed to walk in hospital room?: A Little Help needed climbing 3-5 steps with a railing? : A Lot 6 Click Score: 17    End of Session   Activity Tolerance: Patient tolerated treatment well Patient left: in bed;with call bell/phone within reach;with family/visitor present;with nursing/sitter in room (bed alarm not armed upon PT arrival, pt sitting EOB finishing breakfast, pt knows to mobilize with staff support only) Nurse Communication: Mobility status (pt has had falls since rehab in [facility]) PT Visit Diagnosis: Unsteadiness on feet (R26.81);Other abnormalities of gait and mobility (R26.89);Muscle weakness (generalized) (M62.81);Pain Pain - Right/Left: Right Pain - part of body: Leg     Time: 1191-4782 PT Time Calculation (min) (ACUTE ONLY): 26 min  Charges:  $Therapeutic Activity: 8-22 mins $Self Care/Home Management: 8-22                    Marye Round, PT DPT Acute Rehabilitation Services Pager [phone]  Office [phone]    [name] 07/01/2021, 9:22 AM

## 2021-07-01 NOTE — Consult Note (Signed)
   [facility] CM Inpatient Consult   07/01/2021  [name] 08/16/1961 [id number]  [facility] [[facility]] Patient: [name]  Primary Care Provider:  Shelda Pal, DO [place] [facility], Embedded provider  Patient screened for hospitalization with noted high risk score for unplanned readmission risk with less than 30 days readmission.  Review of patient's medical record reveals patient is in an Embedded practice that has Chronic Care Management program and team. Met with patient and spouse  at bedside to discuss follow up with Embedded Chronic Care Management. Patient states she has appointments already and has home health coming out [[facility]] noted and will check into the CCM program if she has needs such as paying for a prosthetic. Gave patient a brochure and contact information and encouraged her to follow up with her primary care provider.  Plan:  Continue to follow progress and disposition to assess for post hospital care management needs.  Patient is being recommended home with home health.  For questions contact:   Natividad Brood, RN BSN [facility] Liaison  [phone] business mobile phone Toll free office [phone]  Fax number: [phone] [name]

## 2021-07-05 ENCOUNTER — Encounter: Payer: Self-pay | Admitting: Family Medicine

## 2021-07-05 ENCOUNTER — Other Ambulatory Visit: Payer: Self-pay

## 2021-07-05 ENCOUNTER — Ambulatory Visit (INDEPENDENT_AMBULATORY_CARE_PROVIDER_SITE_OTHER): Payer: 59 | Admitting: Family Medicine

## 2021-07-05 VITALS — BP 118/62 | HR 91 | Temp 98.0°F

## 2021-07-05 DIAGNOSIS — E875 Hyperkalemia: Secondary | ICD-10-CM | POA: Diagnosis not present

## 2021-07-05 DIAGNOSIS — Z89511 Acquired absence of right leg below knee: Secondary | ICD-10-CM | POA: Diagnosis not present

## 2021-07-05 LAB — BASIC METABOLIC PANEL
BUN: 28 mg/dL — ABNORMAL HIGH (ref 6–23)
CO2: 25 mEq/L (ref 19–32)
Calcium: 8.6 mg/dL (ref 8.4–10.5)
Chloride: 104 mEq/L (ref 96–112)
Creatinine, Ser: 1.39 mg/dL — ABNORMAL HIGH (ref 0.40–1.20)
GFR: 41.39 mL/min — ABNORMAL LOW (ref >60.00)
Glucose, Bld: 189 mg/dL — ABNORMAL HIGH (ref 70–99)
Potassium: 4.8 mEq/L (ref 3.5–5.1)
Sodium: 140 mEq/L (ref 135–145)

## 2021-07-05 LAB — CBC
HCT: 29.4 % — ABNORMAL LOW (ref 36.0–46.0)
Hemoglobin: 9 g/dL — ABNORMAL LOW (ref 12.0–15.0)
MCHC: 30.7 g/dL (ref 30.0–36.0)
MCV: 82 fl (ref 78.0–100.0)
Platelets: 264 10*3/uL (ref 150.0–400.0)
RBC: 3.58 Mil/uL — ABNORMAL LOW (ref 3.87–5.11)
RDW: 17.6 % — ABNORMAL HIGH (ref 11.5–15.5)
WBC: 8.8 10*3/uL (ref 4.0–10.5)

## 2021-07-05 MED ORDER — PROMETHAZINE HCL 25 MG PO TABS: 25.0000 mg | ORAL_TABLET | Freq: Three times a day (TID) | ORAL | 2 refills | Status: DC | PRN

## 2021-07-05 MED ORDER — OMEPRAZOLE 40 MG PO CPDR: 40.0000 mg | DELAYED_RELEASE_CAPSULE | Freq: Every day | ORAL | 3 refills | Status: DC

## 2021-07-05 MED ORDER — PRAMIPEXOLE DIHYDROCHLORIDE 1 MG PO TABS: 1.0000 mg | ORAL_TABLET | ORAL | 2 refills | Status: DC

## 2021-07-05 MED ORDER — GABAPENTIN 400 MG PO CAPS: 1600.0000 mg | ORAL_CAPSULE | ORAL | 2 refills | Status: DC

## 2021-07-05 NOTE — Patient Instructions (Signed)
Please call Dr. Audrie Lia office regarding the wound vac drainage filling up quickly.   Give [place] 2-3 business days to get the results of your labs back.   Keep the diet clean and stay active.  Let us know if you need anything.

## 2021-07-05 NOTE — Progress Notes (Signed)
Chief Complaint  Patient presents with   Hospitalization Follow-up    Subjective: Patient is a 60 y.o. female here for hosp f/u. Here w spouse.   Patient following up from a recent revision of a right BKA.  She has a wound VAC presently.  It is filling out faster than they expected.  Then follow-up in 2 days but feels it will overflow before then. No fevers. Appetite is fair. Adjusting to moving around and getting places.   Past Medical History:  Diagnosis Date   Aortic stenosis    mild AS by echo 02/2021   Asthma    "on daily RX and rescue inhaler" (04/18/2018)   Chronic diastolic CHF (congestive heart failure) (HCC) 09/2015   Chronic lower back pain    Chronic neck pain    Chronic pain syndrome    Fentanyl Patch   Colon polyps    Coronary artery disease    cath with normal LM, 30% LAD, 85% mid RCA and 95% distal RCA s/p PCI of the mid to distal RCA and now on DAPT with ASA and Ticagrelor.     Eczema    Excessive daytime sleepiness 11/26/2015   Fibromyalgia    Gallstones    GERD (gastroesophageal reflux disease)    Heart murmur    "noted for the 1st time on 04/18/2018"   History of blood transfusion 07/2010   "S/P oophorectomy"   History of gout    History of hiatal hernia 1980s   "gone now" (04/18/2018)   Hyperlipidemia    Hypertension    takes Metoprolol and Enalapril daily   Hypothyroidism    takes Synthroid daily   IBS (irritable bowel syndrome)    Migraine    "nothing in the 2000s" (04/18/2018)   Mixed connective tissue disease (HCC)    NAFLD (nonalcoholic fatty liver disease)    Pneumonia    "several times" (04/18/2018)   PVC's (premature ventricular contractions)    noted on event monitor 02/2021   Rheumatoid arthritis (HCC)    "hands, elbows, shoulders, probably knees" (04/18/2018)   Scoliosis    Sleep apnea    mild - does not use cpap   Spondylosis    Type II diabetes mellitus (HCC)    takes Metformin and Hum R daily (04/18/2018)   Walker as ambulation aid    also  uses wheelchair    Objective: BP 118/62   Pulse 91   Temp 98 F (36.7 C) (Oral)   SpO2 97%  General: Awake, appears stated age Heart: RRR Lungs: CTAB, no rales, wheezes or rhonchi. No accessory muscle use Psych: Age appropriate judgment and insight, normal affect and mood  Assessment and Plan: Hx of BKA, right (HCC) - Plan: CBC  Hyperkalemia - Plan: Basic metabolic panel  F/u on labs. Refilled meds. Rec'd reaching out to ortho regarding wound vac.  F/u as originally scheduled. The patient and her spouse voiced understanding and agreement to the plan.  I spent 32 minutes discussing the above hospitalization with the patient, plan moving forward, and reviewing her chart on the same day of the visit.  Jilda Roche [place], DO 07/05/21  12:03 PM

## 2021-07-06 ENCOUNTER — Other Ambulatory Visit (HOSPITAL_COMMUNITY): Payer: Self-pay

## 2021-07-06 ENCOUNTER — Encounter: Payer: Self-pay | Admitting: Family

## 2021-07-06 ENCOUNTER — Ambulatory Visit (INDEPENDENT_AMBULATORY_CARE_PROVIDER_SITE_OTHER): Payer: 59 | Admitting: Family

## 2021-07-06 DIAGNOSIS — T8781 Dehiscence of amputation stump: Secondary | ICD-10-CM

## 2021-07-06 MED ORDER — TIZANIDINE HCL 2 MG PO TABS
2.0000 mg | ORAL_TABLET | Freq: Three times a day (TID) | ORAL | 0 refills | Status: DC
  Filled 2021-07-06: qty 90, 30d supply, fill #0

## 2021-07-06 NOTE — Progress Notes (Signed)
Post-Op Visit Note   Patient: [name]           Date of Birth: [birth year]           MRN: [id number] Visit Date: 07/06/2021 PCP: Sharlene Dory, DO  Chief Complaint: No chief complaint on file.   HPI:  HPI The patient is a 60 year old woman seen status post revision right below-knee amputation 1 week ago.  Wound VAC removed today.  She did fill wound VAC canister and is still draining into the second canister.  She is complaining of some itching and burning pain from the adhesive  Ortho Exam On examination of the right below-knee amputation incision approximated with sutures there is some slight gaping medially and laterally this is open about 2 mm there is dried blood in the wound bed there is edema to the residual limb with scant weeping mild erythema no warmth no ascending cellulitis  See attached photo  Visit Diagnoses:  1. Dehiscence of amputation stump (HCC)     Plan: Begin daily Dial soap cleansing.  Dry dressing changes.  Daily or as needed soiled.  Will follow-up in the office in 1 week shrinker for edema.  Follow-Up Instructions: Return in about 6 days (around 07/12/2021), or afternoon.   Imaging: No results found.  Orders:  No orders of the defined types were placed in this encounter.  Meds ordered this encounter  Medications   tiZANidine (ZANAFLEX) 2 MG tablet    Sig: Take 1 tablet (2 mg total) by mouth 3 (three) times daily.    Dispense:  90 tablet    Refill:  0     PMFS History: Patient Active Problem List   Diagnosis Date Noted   Hx of BKA, right (HCC) 06/29/2021   Dehiscence of amputation stump (HCC)    Acute blood loss anemia 06/24/2021   Postoperative wound infection 06/24/2021   UTI (urinary tract infection) 06/24/2021   Superficial dehiscence of operation wound 06/24/2021   Unilateral complete BKA, right, subsequent encounter (HCC)    Bacteremia due to Enterococcus 05/28/2021   Foot osteomyelitis (HCC) 05/26/2021   Sepsis  (HCC) 05/26/2021   Digestive disorder 05/18/2021   Bilateral lower extremity edema 05/02/2021   Statin myopathy 03/04/2021   Aortic stenosis 03/04/2021   Sjogren's syndrome (HCC) 02/11/2020   Acute hematogenous osteomyelitis of right foot (HCC) 01/28/2020   Cutaneous abscess of right foot    Subacute osteomyelitis, right ankle and foot (HCC)    Statin intolerance 08/16/2019   Gram-negative bacteremia 10/18/2018   Streptococcal bacteremia 10/18/2018   Ulcer of lower extremity, limited to breakdown of skin (HCC) 10/03/2018   CAD S/P percutaneous coronary angioplasty 04/19/2018   Essential hypertension    Moderate persistent asthma 03/21/2018   NAFLD (nonalcoholic fatty liver disease)    Recurrent cellulitis of lower extremity 01/02/2018   Cellulitis of lower leg 01/02/2018   Chronic diarrhea 05/08/2017   H/O Clostridium difficile infection 05/08/2017   Rectal bleeding 05/08/2017   Hyperbilirubinemia 04/29/2016   Hyponatremia 04/29/2016   Cellulitis of right foot 03/09/2016   Mild persistent asthma 02/15/2016   Allergic rhinitis due to pollen 02/15/2016   Anaphylactic reaction due to food 02/10/2016   Diabetes mellitus type 2 in obese (HCC) 02/10/2016   Gastroesophageal reflux disease without esophagitis 02/10/2016   Atopic eczema 02/10/2016   Cervical nerve root disorder 12/15/2015   Lumbar radiculopathy 12/15/2015   Daytime somnolence 11/26/2015   Venous stasis dermatitis of both lower extremities 02/11/2015  Morbid obesity (HCC) 06/19/2014   Abnormal LFTs 03/26/2014   B-complex deficiency 03/26/2014   Benign essential HTN 03/26/2014   Chronic pain associated with significant psychosocial dysfunction 03/26/2014   Diaphragmatic hernia 03/26/2014   Gastroesophageal reflux disease 03/26/2014   Hammer toe 03/26/2014   H/O neoplasm 03/26/2014   Adaptive colitis 03/26/2014   Diabetic polyneuropathy (HCC) 03/26/2014   Deafness, sensorineural 03/26/2014   Fibromyalgia  01/20/2014   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/20/2014   Hyperlipidemia 11/11/2012   Mixed connective tissue disease (HCC) 11/11/2012   Hypothyroidism 11/11/2012   Uncomplicated asthma 11/11/2012   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HCC) 02/20/2012   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     Eczema    Excessive daytime sleepiness 11/26/2015   Fibromyalgia    Gallstones    GERD (gastroesophageal reflux disease)    Heart murmur    "noted for the 1st time on 04/18/2018"   History of blood transfusion 07/2010   "S/P oophorectomy"   History of gout    History of hiatal hernia 1980s   "gone now" (04/18/2018)   Hyperlipidemia    Hypertension    takes Metoprolol and Enalapril daily   Hypothyroidism    takes Synthroid daily   IBS (irritable bowel syndrome)    Migraine    "nothing in the 2000s" (04/18/2018)   Mixed connective tissue disease (HCC)    NAFLD (nonalcoholic fatty liver disease)    Pneumonia    "several times" (04/18/2018)   PVC's (premature ventricular contractions)    noted on event monitor 02/2021   Rheumatoid arthritis (HCC)    "hands, elbows, shoulders, probably knees" (04/18/2018)   Scoliosis    Sleep apnea    mild - does not use cpap   Spondylosis    Type II diabetes mellitus (HCC)    takes Metformin and Hum R daily (04/18/2018)   Walker as ambulation aid    also uses wheelchair    Family History   Problem Relation Age of Onset   CAD Mother    Hypertension Mother    Heart attack Mother    CAD Father    Heart attack Father    Allergic rhinitis Father    Asthma Father    Hypertension Brother    Hypertension Brother    Pancreatic cancer Paternal Aunt    Breast cancer Paternal Aunt    Lung cancer Paternal Aunt    Asthma Son     Past Surgical History:  Procedure Laterality Date   ABDOMINAL HYSTERECTOMY  06/2005   "w/right ovariy"   AMPUTATION Right 01/28/2020    RIGHT FOURTH AND FIFTH RAY AMPUTATION   AMPUTATION Right 01/28/2020   Procedure: RIGHT FOURTH AND FIFTH RAY AMPUTATION,;  Surgeon: [name], MD;  Location: [facility] OR;  Service: Orthopedics;  Laterality: Right;   AMPUTATION Right 06/01/2021   Procedure: RIGHT BELOW KNEE AMPUTATION;  Surgeon: [name], MD;  Location: [facility] OR;  Service: Orthopedics;  Laterality: Right;   APPENDECTOMY     BREAST BIOPSY Bilateral    9 total (04/18/2018)   CORONARY ANGIOPLASTY WITH STENT PLACEMENT  10/01/2015   normal LM, 30% LAD, 85% mid RCA and 95% distal RCA s/p PCI of the mid to distal RCA and now on DAPT with ASA and Ticagrelor.     CORONARY STENT INTERVENTION Right 04/18/2018   Procedure: CORONARY STENT INTERVENTION;  Surgeon: [name], MD;  Location: [facility] IN[facility]AS[facility];  Service: Cardiovascular;  Laterality: Right;   DILATION AND CURETTAGE OF UTERUS     FRACTURE SURGERY     LAPAROSCOPIC CHOLECYSTECTOMY     LEFT HEART CATH AND CORONARY ANGIOGRAPHY N/A 04/18/2018   Procedure: LEFT HEART CATH AND CORONARY ANGIOGRAPHY;  Surgeon: [name], MD;  Location: [facility];  Service: Cardiovascular;  Laterality: N/A;   MUSCLE BIOPSY Left    "leg"   OOPHORECTOMY  07/2010   RADIOLOGY WITH ANESTHESIA N/A 05/25/2016   Procedure: RADIOLOGY WITH ANESTHESIA;  Surgeon: Medication Radiologist, MD;  Location: [facility] OR;  Service: Radiology;  Laterality: N/A;   STUMP REVISION Right 06/29/2021   Procedure: REVISION RIGHT  BELOW KNEE AMPUTATION;  Surgeon: [name], MD;  Location: [facility] OR;  Service: Orthopedics;  Laterality: Right;   TEE WITHOUT CARDIOVERSION N/A 06/01/2021   Procedure: TRANSESOPHAGEAL ECHOCARDIOGRAM (TEE);  Surgeon: Sande Rives, MD;  Location: [facility] OR;  Service: Cardiovascular;  Laterality: N/A;   TONSILLECTOMY AND ADENOIDECTOMY     WRIST FRACTURE SURGERY Left    "crushed it"   Social History   Occupational History   Not on file  Tobacco Use   Smoking status: Former    Packs/day: 1.00    Years: 19.00    Pack years: 19.00    Types: Cigarettes    Start date: 70    Quit date: 02/11/2004    Years since quitting: 17.4   Smokeless tobacco: Never  Vaping Use   Vaping Use: Never used  Substance and Sexual Activity   Alcohol use: Yes    Comment: RARE Wine   Drug use: Not Currently    Types: Marijuana    Comment: "only in my teens"   Sexual activity: Yes    Birth control/protection: Surgical    Comment: Hysterectomy

## 2021-07-07 ENCOUNTER — Other Ambulatory Visit (HOSPITAL_COMMUNITY): Payer: Self-pay

## 2021-07-07 ENCOUNTER — Encounter: Payer: 59 | Admitting: Orthopedic Surgery

## 2021-07-08 ENCOUNTER — Telehealth: Payer: Self-pay

## 2021-07-08 NOTE — Telephone Encounter (Signed)
Transition Care Management Follow-up Telephone Call Date of discharge and from where: 07/01/21 from [facility] How have you been since you were released from the hospital? "I've been fine" Any questions or concerns? No  Items Reviewed: Did the pt receive and understand the discharge instructions provided? Yes  Medications obtained and verified? Yes  Other? No  Any new allergies since your discharge? No  Dietary orders reviewed? Yes Do you have support at home? Yes   Home Care and Equipment/Supplies: Were home health services ordered? yes If so, what is the name of the agency? [facility]  Has the agency set up a time to come to the patient's home? not applicable Were any new equipment or medical supplies ordered?  No What is the name of the medical supply agency? Not applicable Were you able to get the supplies/equipment? not applicable Do you have any questions related to the use of the equipment or supplies? No  Functional Questionnaire: (I = Independent and D = Dependent) ADLs: assistance  Bathing/Dressing- assistance  Meal Prep- assistance  Eating- I  Maintaining continence- I  Transferring/Ambulation- assistance  Managing Meds- I  Follow up appointments reviewed:  PCP Hospital f/u appt confirmed? Yes  saw PCP on 07/05/21. Specialist Hospital f/u appt confirmed? Yes  Scheduled to see Barnie Del, NP 07/13/21 at 2 pm. Scheduled to see Dr. Carlis Abbott on 07/15/21 @ 1:00pm. Dr. Lajoyce Corners seen on 07/07/21. Are transportation arrangements needed? No  If their condition worsens, is the pt aware to call PCP or go to the Emergency Dept.? Yes Was the patient provided with contact information for the PCP's office or ED? Yes Was to pt encouraged to call back with questions or concerns? Yes    Kathyrn Sheriff, RN, MSN, BSN, CCM [facility] Care Management Coordinator [phone]

## 2021-07-08 NOTE — Telephone Encounter (Signed)
Vincenza Hews called requesting verbal orders to see patient next week for  2 week 7 1 week 1

## 2021-07-11 NOTE — Telephone Encounter (Signed)
Call made to [facility] LM on ID VM with approval of therapy.

## 2021-07-13 ENCOUNTER — Ambulatory Visit (INDEPENDENT_AMBULATORY_CARE_PROVIDER_SITE_OTHER): Payer: 59 | Admitting: Family

## 2021-07-13 DIAGNOSIS — T8781 Dehiscence of amputation stump: Secondary | ICD-10-CM

## 2021-07-15 ENCOUNTER — Ambulatory Visit: Payer: 59

## 2021-07-15 ENCOUNTER — Ambulatory Visit (INDEPENDENT_AMBULATORY_CARE_PROVIDER_SITE_OTHER): Payer: 59 | Admitting: Orthopedic Surgery

## 2021-07-15 ENCOUNTER — Encounter: Payer: 59 | Attending: Physical Medicine and Rehabilitation | Admitting: Physical Medicine and Rehabilitation

## 2021-07-15 ENCOUNTER — Encounter: Payer: Self-pay | Admitting: Physical Medicine and Rehabilitation

## 2021-07-15 ENCOUNTER — Other Ambulatory Visit: Payer: Self-pay

## 2021-07-15 ENCOUNTER — Encounter: Payer: Self-pay | Admitting: Family

## 2021-07-15 VITALS — BP 128/73 | HR 103 | Temp 99.1°F | Ht 62.0 in | Wt 295.0 lb

## 2021-07-15 DIAGNOSIS — Z89511 Acquired absence of right leg below knee: Secondary | ICD-10-CM

## 2021-07-15 DIAGNOSIS — N3000 Acute cystitis without hematuria: Secondary | ICD-10-CM | POA: Diagnosis present

## 2021-07-15 DIAGNOSIS — T8781 Dehiscence of amputation stump: Secondary | ICD-10-CM

## 2021-07-15 DIAGNOSIS — E1169 Type 2 diabetes mellitus with other specified complication: Secondary | ICD-10-CM | POA: Diagnosis present

## 2021-07-15 DIAGNOSIS — G894 Chronic pain syndrome: Secondary | ICD-10-CM | POA: Diagnosis present

## 2021-07-15 DIAGNOSIS — Z5181 Encounter for therapeutic drug level monitoring: Secondary | ICD-10-CM | POA: Insufficient documentation

## 2021-07-15 DIAGNOSIS — G8918 Other acute postprocedural pain: Secondary | ICD-10-CM | POA: Diagnosis present

## 2021-07-15 DIAGNOSIS — Z79891 Long term (current) use of opiate analgesic: Secondary | ICD-10-CM | POA: Diagnosis present

## 2021-07-15 DIAGNOSIS — Z79899 Other long term (current) drug therapy: Secondary | ICD-10-CM | POA: Insufficient documentation

## 2021-07-15 DIAGNOSIS — S88111D Complete traumatic amputation at level between knee and ankle, right lower leg, subsequent encounter: Secondary | ICD-10-CM | POA: Diagnosis present

## 2021-07-15 DIAGNOSIS — E669 Obesity, unspecified: Secondary | ICD-10-CM | POA: Diagnosis present

## 2021-07-15 MED ORDER — OXYCODONE HCL 20 MG PO TABS: 1.0000 | ORAL_TABLET | Freq: Three times a day (TID) | ORAL | 0 refills | Status: DC | PRN

## 2021-07-15 MED ORDER — LIDOCAINE 5 % EX PTCH: 1.0000 | MEDICATED_PATCH | CUTANEOUS | 0 refills | Status: DC

## 2021-07-15 MED ORDER — PREGABALIN 25 MG PO CAPS: 25.0000 mg | ORAL_CAPSULE | Freq: Two times a day (BID) | ORAL | 0 refills | Status: DC

## 2021-07-15 NOTE — Addendum Note (Signed)
Addended by: Horton Chin on: 07/15/2021 02:34 PM   Modules accepted: Orders

## 2021-07-15 NOTE — Patient Instructions (Signed)
Foods that may reduce pain: 1) Ginger (especially studied for arthritis)- reduce leukotriene production to decrease inflammation 2) Blueberries- high in phytonutrients that decrease inflammation 3) Salmon- marine omega-3s reduce joint swelling and pain 4) Pumpkin seeds- reduce inflammation 5) dark chocolate- reduces inflammation 6) turmeric- reduces inflammation 7) tart cherries - reduce pain and stiffness 8) extra virgin olive oil - its compound olecanthal helps to block prostaglandins  9) chili peppers- can be eaten or applied topically via capsaicin 10) mint- helpful for headache, muscle aches, joint pain, and itching 11) garlic- reduces inflammation  Link to further information on diet for chronic pain: [email or web address]    Diabetes: -check CBGs daily, log, and bring log to follow-up appointment -avoid sugar, bread, pasta, rice -avoid snacking -perform daily foot exam and at least annual eye exam -try to incorporate into your diet some of the following foods which are good for diabetes: 1) cinnamon- imitates effects of insulin, increasing glucose transport into cells ([place] or Vietnamese cinnamon is best, least processed) 2) nuts- can slow down the blood sugar response of carbohydrate rich foods 3) oatmeal- contains and anti-inflammatory compound avenanthramide 4) whole-milk yogurt (best types are no sugar, Greek yogurt, or goat/sheep yogurt) 5) beans- high in protein, fiber, and vitamins, low glycemic index 6) broccoli- great source of vitamin A and C 7) quinoa- higher in protein and fiber than other grains 8) spinach- high in vitamin A, fiber, and protein 9) olive oil- reduces glucose levels, LDL, and triglycerides 10) salmon- excellent amount of omega-3-fatty acids 11) walnuts- rich in antioxidants 12) apples- high in fiber and quercetin 13) carrots- highly nutritious with low impact on blood  sugar 14) eggs- improve HDL (good cholesterol), high in protein, keep you satiated 15) turmeric: improves blood sugars, cardiovascular disease, and protects kidney health 16) garlic: improves blood sugar, blood pressure, pain 17) tomatoes: highly nutritious with low impact on blood sugar  

## 2021-07-15 NOTE — Progress Notes (Signed)
Post-Op Visit Note   Patient: [name]           Date of Birth: 24-Feb-1961           MRN: [id number] Visit Date: 07/13/2021 PCP: Sharlene Dory, DO  Chief Complaint:  Chief Complaint  Patient presents with   Right Leg - Routine Post Op    Right BKA revision 06/29/21    HPI:  HPI The patient is a 60 year old woman seen status post right below-knee amputation revision on October 19.  She continues to have issues with edema and some dehiscence of her amputation she has been having copious serous drainage and weeping from the surrounding skin.  Unfortunately she has been unable to get into her bed and has been sleeping in a recliner with her extremities dependent  Ortho Exam There is significant edema to her right residual limb. on examination of the right residual limb the incision is approximated with sutures unfortunately there is dehiscence medially and laterally there is good healing centrally about 2 cm in length.  There is 100% fibrinous exudative tissue in the wound bed there is surrounding mild erythema dermatitis from scratching and weeping tissue  Visit Diagnoses: No diagnosis found.  Plan: We will trial a compressive garment.  We will place a Unna and Dynaflex compression wrap on the right lower extremity.  She will return Friday for this to be changed.  Encouraged her to get into bed so she can sleep lying flat elevate the right lower extremity  Follow-Up Instructions: Return in about 2 days (around 07/15/2021).   Imaging: No results found.  Orders:  No orders of the defined types were placed in this encounter.  No orders of the defined types were placed in this encounter.    PMFS History: Patient Active Problem List   Diagnosis Date Noted   Hx of BKA, right (HCC) 06/29/2021   Dehiscence of amputation stump (HCC)    Acute blood loss anemia 06/24/2021   Postoperative wound infection 06/24/2021   UTI (urinary tract infection) 06/24/2021    Superficial dehiscence of operation wound 06/24/2021   Unilateral complete BKA, right, subsequent encounter (HCC)    Bacteremia due to Enterococcus 05/28/2021   Foot osteomyelitis (HCC) 05/26/2021   Sepsis (HCC) 05/26/2021   Digestive disorder 05/18/2021   Bilateral lower extremity edema 05/02/2021   Statin myopathy 03/04/2021   Aortic stenosis 03/04/2021   Sjogren's syndrome (HCC) 02/11/2020   Acute hematogenous osteomyelitis of right foot (HCC) 01/28/2020   Cutaneous abscess of right foot    Subacute osteomyelitis, right ankle and foot (HCC)    Statin intolerance 08/16/2019   Gram-negative bacteremia 10/18/2018   Streptococcal bacteremia 10/18/2018   Ulcer of lower extremity, limited to breakdown of skin (HCC) 10/03/2018   CAD S/P percutaneous coronary angioplasty 04/19/2018   Essential hypertension    Moderate persistent asthma 03/21/2018   NAFLD (nonalcoholic fatty liver disease)    Recurrent cellulitis of lower extremity 01/02/2018   Cellulitis of lower leg 01/02/2018   Chronic diarrhea 05/08/2017   H/O Clostridium difficile infection 05/08/2017   Rectal bleeding 05/08/2017   Hyperbilirubinemia 04/29/2016   Hyponatremia 04/29/2016   Cellulitis of right foot 03/09/2016   Mild persistent asthma 02/15/2016   Allergic rhinitis due to pollen 02/15/2016   Anaphylactic reaction due to food 02/10/2016   Diabetes mellitus type 2 in obese (HCC) 02/10/2016   Gastroesophageal reflux disease without esophagitis 02/10/2016   Atopic eczema 02/10/2016   Cervical nerve root disorder 12/15/2015  Lumbar radiculopathy 12/15/2015   Daytime somnolence 11/26/2015   Venous stasis dermatitis of both lower extremities 02/11/2015   Morbid obesity ([place]) 06/19/2014   Abnormal LFTs 03/26/2014   B-complex deficiency 03/26/2014   Benign essential HTN 03/26/2014   Chronic pain associated with significant psychosocial dysfunction 03/26/2014   Diaphragmatic hernia 03/26/2014   Gastroesophageal reflux  disease 03/26/2014   Hammer toe 03/26/2014   H/O neoplasm 03/26/2014   Adaptive colitis 03/26/2014   Diabetic polyneuropathy ([place]) 03/26/2014   Deafness, sensorineural 03/26/2014   Fibromyalgia 01/20/2014   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/20/2014   Hyperlipidemia 11/11/2012   Mixed connective tissue disease ([place]) 11/11/2012   Hypothyroidism 99991111   Uncomplicated asthma 99991111   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[place]) 02/20/2012   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     Eczema    Excessive daytime sleepiness 11/26/2015   Fibromyalgia    Gallstones    GERD (gastroesophageal reflux disease)    Heart murmur    "noted for the 1st time on 04/18/2018"   History of blood transfusion 07/2010   "S/P oophorectomy"   History of gout    History of hiatal hernia 1980s   "gone now" (04/18/2018)   Hyperlipidemia    Hypertension    takes Metoprolol and Enalapril daily   Hypothyroidism    takes Synthroid daily   IBS (irritable bowel syndrome)    Migraine    "nothing in the 2000s" (04/18/2018)   Mixed connective tissue disease ([place])    NAFLD (nonalcoholic fatty liver disease)    Pneumonia    "several times" (04/18/2018)   PVC's (premature ventricular contractions)    noted on event monitor 02/2021   Rheumatoid arthritis ([place])    "hands, elbows, shoulders, probably knees" (04/18/2018)   Scoliosis    Sleep apnea     mild - does not use cpap   Spondylosis    Type II diabetes mellitus ([place])    takes Metformin and Hum R daily (04/18/2018)   Walker as ambulation aid    also uses wheelchair    Family History  Problem Relation Age of Onset   CAD Mother    Hypertension Mother    Heart attack Mother    CAD Father    Heart attack Father    Allergic rhinitis Father    Asthma Father    Hypertension Brother    Hypertension Brother    Pancreatic cancer Paternal Aunt    Breast cancer Paternal 73    Lung cancer Paternal Aunt    Asthma Son     Past Surgical History:  Procedure Laterality Date   ABDOMINAL HYSTERECTOMY  06/2005   "w/right ovariy"   AMPUTATION Right 01/28/2020    RIGHT FOURTH AND FIFTH RAY AMPUTATION   AMPUTATION Right 01/28/2020   Procedure:  RIGHT FOURTH AND FIFTH RAY AMPUTATION,;  Surgeon: Newt Minion, MD;  Location: [place];  Service: Orthopedics;  Laterality: Right;   AMPUTATION Right 06/01/2021   Procedure: RIGHT BELOW KNEE AMPUTATION;  Surgeon: Newt Minion, MD;  Location: [place];  Service: Orthopedics;  Laterality: Right;   APPENDECTOMY     BREAST BIOPSY Bilateral    9 total (04/18/2018)   CORONARY ANGIOPLASTY WITH STENT PLACEMENT  10/01/2015   normal LM, 30% LAD, 85% mid RCA and 95% distal RCA s/p PCI of the mid to distal RCA and now on DAPT with ASA and Ticagrelor.     CORONARY STENT INTERVENTION Right 04/18/2018   Procedure: CORONARY STENT INTERVENTION;  Surgeon: Burnell Blanks, MD;  Location: [place];  Service: Cardiovascular;  Laterality: Right;   DILATION AND CURETTAGE OF UTERUS     FRACTURE SURGERY     LAPAROSCOPIC CHOLECYSTECTOMY     LEFT HEART CATH AND CORONARY ANGIOGRAPHY N/A 04/18/2018   Procedure: LEFT HEART CATH AND CORONARY ANGIOGRAPHY;  Surgeon: Burnell Blanks, MD;  Location: [place] [facility];  Service: Cardiovascular;  Laterality: N/A;   MUSCLE BIOPSY Left    "leg"   OOPHORECTOMY  07/2010   RADIOLOGY WITH ANESTHESIA N/A 05/25/2016    Procedure: RADIOLOGY WITH ANESTHESIA;  Surgeon: Medication Radiologist, MD;  Location: [place];  Service: Radiology;  Laterality: N/A;   STUMP REVISION Right 06/29/2021   Procedure: REVISION RIGHT BELOW KNEE AMPUTATION;  Surgeon: Newt Minion, MD;  Location: [place];  Service: Orthopedics;  Laterality: Right;   TEE WITHOUT CARDIOVERSION N/A 06/01/2021   Procedure: TRANSESOPHAGEAL ECHOCARDIOGRAM (TEE);  Surgeon: Geralynn Rile, MD;  Location: [place];  Service: Cardiovascular;  Laterality: N/A;   TONSILLECTOMY AND ADENOIDECTOMY     WRIST FRACTURE SURGERY Left    "crushed it"   Social History   Occupational History   Not on file  Tobacco Use   Smoking status: Former    Packs/day: 1.00    Years: 19.00    Pack years: 19.00    Types: Cigarettes    Start date: 64    Quit date: 02/11/2004    Years since quitting: 17.4   Smokeless tobacco: Never  Vaping Use   Vaping Use: Never used  Substance and Sexual Activity   Alcohol use: Yes    Comment: RARE Wine   Drug use: Not Currently    Types: Marijuana    Comment: "only in my teens"   Sexual activity: Yes    Birth control/protection: Surgical    Comment: Hysterectomy

## 2021-07-15 NOTE — Addendum Note (Signed)
Addended by: Becky Sax on: 07/15/2021 02:21 PM   Modules accepted: Orders

## 2021-07-15 NOTE — Progress Notes (Signed)
Subjective:    Patient ID: [name], female    DOB: 07/07/1961, 60 y.o.   MRN: [id number]  HPI [name] is a 60 year old woman who presents for hospital follow-up after CIR admission for right AKA.   1) Right AKA -still draining a lot, clear fluid with a tinge of pink -Wound vac removed -on aspirin -seen by Dr. Jess Barters team today. -doing ok with her wheelchair mobility -therapy just started yesterday.    2) Residual limb pain -she has been using the tramadol and oxycodone.  -oxycodone is more effective for her.  -she takes oxycodone 20mg  every 8 hours approximately.   3) Confusion: -husband notes that this is worse with the tramadol.   4) Sciatic pain: -pain has returned.   Pain Inventory Average Pain 6 Pain Right Now 8 My pain is constant, sharp, burning, stabbing, tingling, and aching  In the last 24 hours, has pain interfered with the following? General activity 9 Relation with others 8 Enjoyment of life 10 What TIME of day is your pain at its worst? daytime, evening, and night Sleep (in general) Poor  Pain is worse with: bending, sitting, and standing Pain improves with: rest, heat/ice, medication, injections, and VIDEO GAMES Relief from Meds: 10  use a walker ability to climb steps?  no do you drive?  no use a wheelchair needs help with transfers  I need assistance with the following:  dressing, bathing, toileting, meal prep, household duties, and shopping Do you have any goals in this area?  yes  bladder control problems tremor tingling trouble walking spasms dizziness loss of taste or smell  Any changes since last visit?  no  Any changes since last visit?  no    Family History  Problem Relation Age of Onset   CAD Mother    Hypertension Mother    Heart attack Mother    CAD Father    Heart attack Father    Allergic rhinitis Father    Asthma Father    Hypertension Brother    Hypertension Brother    Pancreatic cancer Paternal  Aunt    Breast cancer Paternal Aunt    Lung cancer Paternal Aunt    Asthma Son    Social History   Socioeconomic History   Marital status: Married    Spouse name: Not on file   Number of children: 1   Years of education: Not on file   Highest education level: Not on file  Occupational History   Not on file  Tobacco Use   Smoking status: Former    Packs/day: 1.00    Years: 19.00    Pack years: 19.00    Types: Cigarettes    Start date: 56    Quit date: 02/11/2004    Years since quitting: 17.4   Smokeless tobacco: Never  Vaping Use   Vaping Use: Never used  Substance and Sexual Activity   Alcohol use: Yes    Comment: RARE Wine   Drug use: Not Currently    Types: Marijuana    Comment: "only in my teens"   Sexual activity: Yes    Birth control/protection: Surgical    Comment: Hysterectomy  Other Topics Concern   Not on file  Social History Narrative   Not on file   Social Determinants of Health   Financial Resource Strain: Not on file  Food Insecurity: Not on file  Transportation Needs: Not on file  Physical Activity: Not on file  Stress: Not  on file  Social Connections: Not on file   Past Surgical History:  Procedure Laterality Date   ABDOMINAL HYSTERECTOMY  06/2005   "w/right ovariy"   AMPUTATION Right 01/28/2020    RIGHT FOURTH AND FIFTH RAY AMPUTATION   AMPUTATION Right 01/28/2020   Procedure: RIGHT FOURTH AND FIFTH RAY AMPUTATION,;  Surgeon: [name], MD;  [facility];  Service: Orthopedics;  Laterality: Right;   AMPUTATION Right 06/01/2021   Procedure: RIGHT BELOW KNEE AMPUTATION;  Surgeon: [name], MD;  Location: [facility] OR;  Service: Orthopedics;  Laterality: Right;   APPENDECTOMY     BREAST BIOPSY Bilateral    9 total (04/18/2018)   CORONARY ANGIOPLASTY WITH STENT PLACEMENT  10/01/2015   normal LM, 30% LAD, 85% mid RCA and 95% distal RCA s/p PCI of the mid to distal RCA and now on DAPT with ASA and Ticagrelor.     CORONARY STENT  INTERVENTION Right 04/18/2018   Procedure: CORONARY STENT INTERVENTION;  Surgeon: [name], MD;  Location[facility];  Service: Cardiovascular;  Laterality: Right;   DILATION AND CURETTAGE OF UTERUS     FRACTURE SURGERY     LAPAROSCOPIC CHOLECYSTECTOMY     LEFT HEART CATH AND CORONARY ANGIOGRAPHY N/A 04/18/2018   Procedure: LEFT HEART CATH AND CORONARY ANGIOGRAPHY;  Surgeon: [name], MD;  Location: [facility];  Service: Cardiovascular;  Laterality: N/A;   MUSCLE BIOPSY Left    "leg"   OOPHORECTOMY  07/2010   RADIOLOGY WITH ANESTHESIA N/A 05/25/2016   Procedure: RADIOLOGY WITH ANESTHESIA;  Surgeon: Medication Radiologist, MD;  Location: [facility];  Service: Radiology;  Laterality: N/A;   STUMP REVISION Right 06/29/2021   Procedure: REVISION RIGHT BELOW KNEE AMPUTATION;  Surgeon: [name], MD;  Location: [facility];  Service: Orthopedics;  Laterality: Right;   TEE WITHOUT CARDIOVERSION N/A 06/01/2021   Procedure: TRANSESOPHAGEAL ECHOCARDIOGRAM (TEE);  Surgeon: Sande Rives, MD;  Location: [facility];  Service: Cardiovascular;  Laterality: N/A;   TONSILLECTOMY AND ADENOIDECTOMY     WRIST FRACTURE SURGERY Left    "crushed it"   Past Medical History:  Diagnosis Date   Aortic stenosis    mild AS by echo 02/2021   Asthma    "on daily RX and rescue inhaler" (04/18/2018)   Chronic diastolic CHF (congestive heart failure) (HCC) 09/2015   Chronic lower back pain    Chronic neck pain    Chronic pain syndrome    Fentanyl Patch   Colon polyps    Coronary artery disease    cath with normal LM, 30% LAD, 85% mid RCA and 95% distal RCA s/p PCI of the mid to distal RCA and now on DAPT with ASA and Ticagrelor.     Eczema    Excessive daytime sleepiness 11/26/2015   Fibromyalgia    Gallstones    GERD (gastroesophageal reflux disease)    Heart murmur    "noted for the 1st time on 04/18/2018"   History of blood transfusion 07/2010   "S/P oophorectomy"    History of gout    History of hiatal hernia 1980s   "gone now" (04/18/2018)   Hyperlipidemia    Hypertension    takes Metoprolol and Enalapril daily   Hypothyroidism    takes Synthroid daily   IBS (irritable bowel syndrome)    Migraine    "nothing in the 2000s" (04/18/2018)   Mixed connective tissue disease (HCC)    NAFLD (nonalcoholic fatty liver disease)  Pneumonia    "several times" (04/18/2018)   PVC's (premature ventricular contractions)    noted on event monitor 02/2021   Rheumatoid arthritis (HCC)    "hands, elbows, shoulders, probably knees" (04/18/2018)   Scoliosis    Sleep apnea    mild - does not use cpap   Spondylosis    Type II diabetes mellitus (HCC)    takes Metformin and Hum R daily (04/18/2018)   Walker as ambulation aid    also uses wheelchair   BP 128/73   Pulse (!) 103   Temp 99.1 F (37.3 C)   Ht 5\' 2"  (1.575 m)   Wt 295 lb (133.8 kg) Comment: W/C  SpO2 94%   BMI 53.96 kg/m   Opioid Risk Score:   Fall Risk Score:  `1  Depression screen PHQ 2/9  Depression screen [facility] 2/9 05/02/2021 10/27/2020  Decreased Interest 0 0  Down, Depressed, Hopeless 1 0  PHQ - 2 Score 1 0  Some recent data might be hidden    Review of Systems  Respiratory:  Positive for shortness of breath and wheezing.   Genitourinary:  Negative for decreased urine volume.  Musculoskeletal:  Positive for back pain and gait problem.       TINGLING, SPASMS  Skin:  Positive for wound.  Neurological:  Positive for dizziness and tremors.  All other systems reviewed and are negative.     Objective:   Physical Exam Gen: no distress, normal appearing HEENT: oral mucosa pink and moist, NCAT Cardio: Reg rate Chest: normal effort, normal rate of breathing Abd: soft, non-distended Ext: no edema Psych: pleasant, normal affect Skin: intact Neuro: Alert and oriented x3 Musculoskeletal: Right AKA with shrinker    Assessment & Plan:  1) Acute pain secondary to right AKA.  -Discussed current  symptoms of pain and history of pain.  -Discussed benefits of exercise in reducing pain. -Discussed following foods that may reduce pain: 1) Ginger (especially studied for arthritis)- reduce leukotriene production to decrease inflammation 2) Blueberries- high in phytonutrients that decrease inflammation 3) Salmon- marine omega-3s reduce joint swelling and pain 4) Pumpkin seeds- reduce inflammation 5) dark chocolate- reduces inflammation 6) turmeric- reduces inflammation 7) tart cherries - reduce pain and stiffness 8) extra virgin olive oil - its compound olecanthal helps to block prostaglandins  9) chili peppers- can be eaten or applied topically via capsaicin 10) mint- helpful for headache, muscle aches, joint pain, and itching 11) garlic- reduces inflammation  Link to further information on diet for chronic pain: 10/29/2020    2) Diabetes: -check CBGs daily, log, and bring log to follow-up appointment -avoid sugar, bread, pasta, rice -avoid snacking -perform daily foot exam and at least annual eye exam -try to incorporate into your diet some of the following foods which are good for diabetes: 1) cinnamon- imitates effects of insulin, increasing glucose transport into cells (http://www.bray.com/ or [place] cinnamon is best, least processed) 2) nuts- can slow down the blood sugar response of carbohydrate rich foods 3) oatmeal- contains and anti-inflammatory compound avenanthramide 4) whole-milk yogurt (best types are no sugar, [place] yogurt, or goat/sheep yogurt) 5) beans- high in protein, fiber, and vitamins, low glycemic index 6) broccoli- great source of vitamin A and C 7) quinoa- higher in protein and fiber than other grains 8) spinach- high in vitamin A, fiber, and protein 9) olive oil- reduces glucose levels, LDL, and triglycerides 10) salmon- excellent amount of omega-3-fatty acids 11) walnuts- rich in  antioxidants 12) apples- high in fiber  and quercetin 13) carrots- highly nutritious with low impact on blood sugar 14) eggs- improve HDL (good cholesterol), high in protein, keep you satiated 15) turmeric: improves blood sugars, cardiovascular disease, and protects kidney health 16) garlic: improves blood sugar, blood pressure, pain 17) tomatoes: highly nutritious with low impact on blood sugar   3) Sciatic pain: -prescribed lidocaine patch -prescirbed lyrica 25mg  BID

## 2021-07-19 ENCOUNTER — Telehealth: Payer: Self-pay

## 2021-07-19 ENCOUNTER — Encounter: Payer: Self-pay | Admitting: Orthopedic Surgery

## 2021-07-19 ENCOUNTER — Ambulatory Visit (INDEPENDENT_AMBULATORY_CARE_PROVIDER_SITE_OTHER): Payer: 59 | Admitting: Orthopedic Surgery

## 2021-07-19 DIAGNOSIS — T8781 Dehiscence of amputation stump: Secondary | ICD-10-CM

## 2021-07-19 DIAGNOSIS — Z89511 Acquired absence of right leg below knee: Secondary | ICD-10-CM

## 2021-07-19 MED ORDER — DOXYCYCLINE HYCLATE 100 MG PO TABS
100.0000 mg | ORAL_TABLET | Freq: Two times a day (BID) | ORAL | 0 refills | Status: DC
  Filled 2021-07-19: qty 60, 30d supply, fill #0

## 2021-07-19 MED ORDER — DOXYCYCLINE HYCLATE 100 MG PO TABS: 100.0000 mg | ORAL_TABLET | Freq: Two times a day (BID) | ORAL | 0 refills | Status: DC

## 2021-07-19 NOTE — Progress Notes (Signed)
Office Visit Note   Patient: [name]           Date of Birth: [birth year]           MRN: [id number] Visit Date: 07/19/2021              Requested by: Sharlene Dory, DO [address] [place],  [place] [address] PCP: Sharlene Dory, DO  No chief complaint on file.     HPI: Patient is a 60 year old woman who is 4 weeks status post revision right transtibial amputation.  Patient has had close follow-up after surgery with compression wraps due to the increased swelling from her leg being dependent.  Patient presents at this time with progressive wound dehiscence.  Assessment & Plan: Visit Diagnoses: No diagnosis found.  Plan: With the progressive wound dehiscence I have recommended patient proceed with an above-the-knee amputation.  Patient does not have sufficient residual limb to proceed with a revision of the transtibial amputation.  I have recommended proceeding with above-knee amputation on Friday.  I have encouraged obtaining a second opinion.  Patient is to wash her leg with soap and water packed the wound open with 4 x 4 gauze and use the Ace compression wrap changing daily.  A prescription is called in for doxycycline.  Follow-Up Instructions: Return in about 1 week (around 07/26/2021).   Ortho Exam  Patient is alert, oriented, no adenopathy, well-dressed, normal affect, normal respiratory effort. Examination there is progressive wound dehiscence of the residual limb on the right.  There is dermatitis and maceration of the entire posterior flap and there is wound dehiscence with necrosis of the deep muscle skin and soft tissue laterally.  Imaging: No results found. No images are attached to the encounter.  Labs: Lab Results  Component Value Date   HGBA1C 7.3 (H) 05/27/2021   HGBA1C 7.1 (H) 10/27/2020   HGBA1C 9.0 (H) 01/28/2020   ESRSEDRATE 37 (H) 01/02/2018   CRP 9.8 (H) 01/02/2018   REPTSTATUS 06/23/2021 FINAL 06/21/2021    GRAMSTAIN  01/28/2020    ABUNDANT WBC PRESENT,BOTH PMN AND MONONUCLEAR ABUNDANT GRAM POSITIVE COCCI ABUNDANT GRAM NEGATIVE RODS FEW GRAM POSITIVE RODS    CULT (A) 06/21/2021    >=100,000 COLONIES/mL ESCHERICHIA COLI 30,000 COLONIES/mL PROTEUS MIRABILIS    LABORGA ESCHERICHIA COLI (A) 06/21/2021   LABORGA PROTEUS MIRABILIS (A) 06/21/2021     Lab Results  Component Value Date   ALBUMIN 2.5 (L) 06/09/2021   ALBUMIN 2.9 (L) 05/28/2021   ALBUMIN 3.9 05/26/2021    Lab Results  Component Value Date   MG 1.7 05/02/2021   MG 2.2 10/20/2018   MG 2.2 10/19/2018   Lab Results  Component Value Date   VD25OH 69.04 07/25/2017    No results found for: PREALBUMIN CBC EXTENDED Latest Ref Rng & Units 07/05/2021 07/01/2021 06/30/2021  WBC 4.0 - 10.5 K/uL 8.8 10.5 11.5(H)  RBC 3.87 - 5.11 Mil/uL 3.58(L) 3.33(L) 3.34(L)  HGB 12.0 - 15.0 g/dL 9.0(L) 8.4(L) 8.2(L)  HCT 36.0 - 46.0 % 29.4(L) 28.7(L) 28.8(L)  PLT 150.0 - 400.0 K/uL 264.0 236 243  NEUTROABS 1.7 - 7.7 K/uL - - -  LYMPHSABS 0.7 - 4.0 K/uL - - -     There is no height or weight on file to calculate BMI.  Orders:  No orders of the defined types were placed in this encounter.  Meds ordered this encounter  Medications   DISCONTD: doxycycline (VIBRA-TABS) 100 MG tablet  Sig: Take 1 tablet (100 mg total) by mouth 2 (two) times daily.    Dispense:  60 tablet    Refill:  0   doxycycline (VIBRA-TABS) 100 MG tablet    Sig: Take 1 tablet (100 mg total) by mouth 2 (two) times daily.    Dispense:  60 tablet    Refill:  0     Procedures: No procedures performed  Clinical Data: No additional findings.  ROS:  All other systems negative, except as noted in the HPI. Review of Systems  Objective: Vital Signs: There were no vitals taken for this visit.  Specialty Comments:  No specialty comments available.  PMFS History: Patient Active Problem List   Diagnosis Date Noted   Hx of BKA, right ([place]) 06/29/2021    Dehiscence of amputation stump (HCC)    Acute blood loss anemia 06/24/2021   Postoperative wound infection 06/24/2021   UTI (urinary tract infection) 06/24/2021   Superficial dehiscence of operation wound 06/24/2021   Unilateral complete BKA, right, subsequent encounter ([place])    Bacteremia due to Enterococcus 05/28/2021   Foot osteomyelitis ([place]) 05/26/2021   Sepsis ([place]) 05/26/2021   Digestive disorder 05/18/2021   Bilateral lower extremity edema 05/02/2021   Statin myopathy 03/04/2021   Aortic stenosis 03/04/2021   Sjogren's syndrome ([place]) 02/11/2020   Acute hematogenous osteomyelitis of right foot ([place]) 01/28/2020   Cutaneous abscess of right foot    Subacute osteomyelitis, right ankle and foot ([place])    Statin intolerance 08/16/2019   Gram-negative bacteremia 10/18/2018   Streptococcal bacteremia 10/18/2018   Ulcer of lower extremity, limited to breakdown of skin ([place]) 10/03/2018   CAD S/P percutaneous coronary angioplasty 04/19/2018   Essential hypertension    Moderate persistent asthma 03/21/2018   NAFLD (nonalcoholic fatty liver disease)    Recurrent cellulitis of lower extremity 01/02/2018   Cellulitis of lower leg 01/02/2018   Chronic diarrhea 05/08/2017   H/O Clostridium difficile infection 05/08/2017   Rectal bleeding 05/08/2017   Hyperbilirubinemia 04/29/2016   Hyponatremia 04/29/2016   Cellulitis of right foot 03/09/2016   Mild persistent asthma 02/15/2016   Allergic rhinitis due to pollen 02/15/2016   Anaphylactic reaction due to food 02/10/2016   Diabetes mellitus type 2 in obese ([place]) 02/10/2016   Gastroesophageal reflux disease without esophagitis 02/10/2016   Atopic eczema 02/10/2016   Cervical nerve root disorder 12/15/2015   Lumbar radiculopathy 12/15/2015   Daytime somnolence 11/26/2015   Venous stasis dermatitis of both lower extremities 02/11/2015   Morbid obesity ([place]) 06/19/2014   Abnormal LFTs 03/26/2014   B-complex deficiency 03/26/2014   Benign  essential HTN 03/26/2014   Chronic pain associated with significant psychosocial dysfunction 03/26/2014   Diaphragmatic hernia 03/26/2014   Gastroesophageal reflux disease 03/26/2014   Hammer toe 03/26/2014   H/O neoplasm 03/26/2014   Adaptive colitis 03/26/2014   Diabetic polyneuropathy ([place]) 03/26/2014   Deafness, sensorineural 03/26/2014   Fibromyalgia 01/20/2014   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/20/2014   Hyperlipidemia 11/11/2012   Mixed connective tissue disease ([place]) 11/11/2012   Hypothyroidism 99991111   Uncomplicated asthma 99991111   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[place]) 02/20/2012   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     Eczema    Excessive daytime sleepiness 11/26/2015   Fibromyalgia    Gallstones    GERD (gastroesophageal reflux disease)    Heart murmur    "noted for the 1st time on 04/18/2018"   History of blood transfusion 07/2010   "S/P oophorectomy"   History of gout    History of hiatal hernia 1980s   "gone now" (04/18/2018)   Hyperlipidemia    Hypertension    takes Metoprolol and Enalapril daily   Hypothyroidism    takes Synthroid daily   IBS (irritable bowel syndrome)    Migraine    "nothing in the 2000s" (04/18/2018)   Mixed connective tissue disease ([place])    NAFLD (nonalcoholic fatty liver disease)    Pneumonia    "several times" (04/18/2018)   PVC's (premature ventricular  contractions)    noted on event monitor 02/2021   Rheumatoid arthritis ([place])    "hands, elbows, shoulders, probably knees" (04/18/2018)   Scoliosis    Sleep apnea    mild - does not use cpap   Spondylosis    Type II diabetes mellitus ([place])    takes Metformin and Hum R daily (04/18/2018)   Walker as ambulation aid    also uses wheelchair    Family History  Problem Relation Age of Onset   CAD Mother    Hypertension Mother    Heart attack Mother    CAD Father    Heart attack Father    Allergic rhinitis Father    Asthma Father    Hypertension Brother    Hypertension Brother    Pancreatic cancer Paternal Aunt    Breast cancer Paternal Aunt    Lung cancer Paternal Aunt    Asthma Son     Past Surgical History:  Procedure Laterality Date   ABDOMINAL HYSTERECTOMY  06/2005   "w/right ovariy"   AMPUTATION Right 01/28/2020    RIGHT FOURTH AND FIFTH RAY AMPUTATION   AMPUTATION Right 01/28/2020   Procedure: RIGHT FOURTH AND FIFTH RAY AMPUTATION,;  Surgeon: Newt Minion, MD;  Location: [facility];  Service: Orthopedics;  Laterality: Right;   AMPUTATION Right 06/01/2021   Procedure: RIGHT BELOW KNEE AMPUTATION;  Surgeon: Newt Minion, MD;  Location: [facility];  Service: Orthopedics;  Laterality: Right;   APPENDECTOMY     BREAST BIOPSY Bilateral    9 total (04/18/2018)   CORONARY ANGIOPLASTY WITH STENT PLACEMENT  10/01/2015   normal LM, 30% LAD, 85% mid RCA and 95% distal RCA s/p PCI of the mid to distal RCA and now on DAPT with ASA and Ticagrelor.     CORONARY STENT INTERVENTION Right 04/18/2018   Procedure: CORONARY STENT INTERVENTION;  Surgeon: Burnell Blanks, MD;  Location: [facility];  Service: Cardiovascular;  Laterality: Right;   DILATION AND CURETTAGE OF UTERUS     FRACTURE SURGERY     LAPAROSCOPIC CHOLECYSTECTOMY     LEFT HEART CATH AND CORONARY ANGIOGRAPHY N/A 04/18/2018   Procedure: LEFT HEART CATH AND CORONARY ANGIOGRAPHY;  Surgeon: Burnell Blanks, MD;  Location:  [facility];  Service: Cardiovascular;  Laterality: N/A;   MUSCLE BIOPSY Left    "leg"   OOPHORECTOMY  07/2010   RADIOLOGY WITH ANESTHESIA N/A 05/25/2016   Procedure: RADIOLOGY WITH ANESTHESIA;  Surgeon: Medication Radiologist, MD;  Location: [facility];  Service: Radiology;  Laterality: N/A;   STUMP REVISION Right 06/29/2021   Procedure: REVISION RIGHT BELOW KNEE AMPUTATION;  Surgeon: Newt Minion, MD;  Location: [facility];  Service: Orthopedics;  Laterality: Right;   TEE WITHOUT CARDIOVERSION N/A 06/01/2021   Procedure: TRANSESOPHAGEAL ECHOCARDIOGRAM (TEE);  Surgeon: Geralynn Rile, MD;  Location: [facility];  Service: Cardiovascular;  Laterality: N/A;   TONSILLECTOMY AND ADENOIDECTOMY     WRIST FRACTURE SURGERY Left    "crushed it"   Social History   Occupational History   Not on file  Tobacco Use   Smoking status: Former    Packs/day: 1.00    Years: 19.00    Pack years: 19.00    Types: Cigarettes    Start date: 68    Quit date: 02/11/2004    Years since quitting: 17.4   Smokeless tobacco: Never  Vaping Use   Vaping Use: Never used  Substance and Sexual Activity   Alcohol use: Yes    Comment: RARE Wine   Drug use: Not Currently    Types: Marijuana    Comment: "only in my teens"   Sexual activity: Yes    Birth control/protection: Surgical    Comment: Hysterectomy

## 2021-07-19 NOTE — Telephone Encounter (Signed)
PA for Lidocaine patch sent to [facility]

## 2021-07-19 NOTE — Progress Notes (Signed)
Post-Op Visit Note   Patient: [name]           Date of Birth: November 20, 1960           MRN: [id number] Visit Date: 07/15/2021 PCP: Sharlene Dory, DO  Chief Complaint: No chief complaint on file.   HPI:  HPI Patient is a 60 year old woman who is seen today status post right below-knee amputation.  We have been doing serial compression wraps with Unna paste.  She last had silver cell in the dehisced area of her amputation site Ortho Exam On examination of the right residual limb she does have good wrinkling of the skin from compression.  There is improved appearance of the surrounding skin of the residual limb there is no erythema dermatitis or weeping.  The incision is approximated centrally a length of about 2 cm unfortunately the remainder has dehisced and is open about 15 mm wide this is filled in with 100% fibrinous exudative tissue there is serous drainage there is no erythema warmth or purulence  Visit Diagnoses:  1. Hx of BKA, right (HCC)   2. Dehiscence of amputation stump (HCC)     Plan: We will again apply an interim boot to the right residual limb. she will follow-up for twice weekly changes.  Follow-Up Instructions: Return in about 4 days (around 07/19/2021).   Imaging: No results found.  Orders:  No orders of the defined types were placed in this encounter.  No orders of the defined types were placed in this encounter.    PMFS History: Patient Active Problem List   Diagnosis Date Noted   Hx of BKA, right (HCC) 06/29/2021   Dehiscence of amputation stump (HCC)    Acute blood loss anemia 06/24/2021   Postoperative wound infection 06/24/2021   UTI (urinary tract infection) 06/24/2021   Superficial dehiscence of operation wound 06/24/2021   Unilateral complete BKA, right, subsequent encounter (HCC)    Bacteremia due to Enterococcus 05/28/2021   Foot osteomyelitis (HCC) 05/26/2021   Sepsis (HCC) 05/26/2021   Digestive disorder 05/18/2021    Bilateral lower extremity edema 05/02/2021   Statin myopathy 03/04/2021   Aortic stenosis 03/04/2021   Sjogren's syndrome (HCC) 02/11/2020   Acute hematogenous osteomyelitis of right foot (HCC) 01/28/2020   Cutaneous abscess of right foot    Subacute osteomyelitis, right ankle and foot (HCC)    Statin intolerance 08/16/2019   Gram-negative bacteremia 10/18/2018   Streptococcal bacteremia 10/18/2018   Ulcer of lower extremity, limited to breakdown of skin (HCC) 10/03/2018   CAD S/P percutaneous coronary angioplasty 04/19/2018   Essential hypertension    Moderate persistent asthma 03/21/2018   NAFLD (nonalcoholic fatty liver disease)    Recurrent cellulitis of lower extremity 01/02/2018   Cellulitis of lower leg 01/02/2018   Chronic diarrhea 05/08/2017   H/O Clostridium difficile infection 05/08/2017   Rectal bleeding 05/08/2017   Hyperbilirubinemia 04/29/2016   Hyponatremia 04/29/2016   Cellulitis of right foot 03/09/2016   Mild persistent asthma 02/15/2016   Allergic rhinitis due to pollen 02/15/2016   Anaphylactic reaction due to food 02/10/2016   Diabetes mellitus type 2 in obese (HCC) 02/10/2016   Gastroesophageal reflux disease without esophagitis 02/10/2016   Atopic eczema 02/10/2016   Cervical nerve root disorder 12/15/2015   Lumbar radiculopathy 12/15/2015   Daytime somnolence 11/26/2015   Venous stasis dermatitis of both lower extremities 02/11/2015   Morbid obesity (HCC) 06/19/2014   Abnormal LFTs 03/26/2014   B-complex deficiency 03/26/2014   Benign essential  HTN 03/26/2014   Chronic pain associated with significant psychosocial dysfunction 03/26/2014   Diaphragmatic hernia 03/26/2014   Gastroesophageal reflux disease 03/26/2014   Hammer toe 03/26/2014   H/O neoplasm 03/26/2014   Adaptive colitis 03/26/2014   Diabetic polyneuropathy (HCC) 03/26/2014   Deafness, sensorineural 03/26/2014   Fibromyalgia 01/20/2014   Degenerative arthritis of lumbar spine  01/20/2014   Hyperlipidemia 11/11/2012   Mixed connective tissue disease (HCC) 11/11/2012   Hypothyroidism 11/11/2012   Uncomplicated asthma 11/11/2012   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HCC) 02/20/2012   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     Eczema    Excessive daytime sleepiness 11/26/2015   Fibromyalgia    Gallstones    GERD (gastroesophageal reflux disease)    Heart murmur    "noted for the 1st time on 04/18/2018"   History of blood transfusion 07/2010   "S/P oophorectomy"   History of gout    History of hiatal hernia 1980s   "gone now" (04/18/2018)   Hyperlipidemia    Hypertension    takes Metoprolol and Enalapril daily   Hypothyroidism    takes Synthroid daily   IBS (irritable bowel syndrome)    Migraine    "nothing in the 2000s" (04/18/2018)   Mixed connective tissue disease (HCC)    NAFLD (nonalcoholic fatty liver disease)    Pneumonia    "several times" (04/18/2018)   PVC's (premature ventricular contractions)    noted on event monitor 02/2021   Rheumatoid arthritis (HCC)    "hands, elbows, shoulders, probably knees" (04/18/2018)   Scoliosis    Sleep apnea    mild - does not use cpap   Spondylosis    Type II diabetes mellitus (HCC)    takes Metformin and Hum R daily (04/18/2018)   Walker as ambulation aid    also uses wheelchair    Family History  Problem Relation Age of Onset   CAD Mother     Hypertension Mother    Heart attack Mother    CAD Father    Heart attack Father    Allergic rhinitis Father    Asthma Father    Hypertension Brother    Hypertension Brother    Pancreatic cancer Paternal Aunt    Breast cancer Paternal Aunt    Lung cancer Paternal Aunt    Asthma Son     Past Surgical History:  Procedure Laterality Date   ABDOMINAL HYSTERECTOMY  06/2005   "w/right ovariy"   AMPUTATION Right 01/28/2020    RIGHT FOURTH AND FIFTH RAY AMPUTATION   AMPUTATION Right 01/28/2020   Procedure: RIGHT FOURTH AND FIFTH RAY AMPUTATION,;  Surgeon: [name], MD;  Location[facility];  Service: Orthopedics;  Laterality: Right;   AMPUTATION Right 06/01/2021   Procedure: RIGHT BELOW KNEE AMPUTATION;  Surgeon: Aldean Baker  V, MD;  Location: [facility];  Service: Orthopedics;  Laterality: Right;   APPENDECTOMY     BREAST BIOPSY Bilateral    9 total (04/18/2018)   CORONARY ANGIOPLASTY WITH STENT PLACEMENT  10/01/2015   normal LM, 30% LAD, 85% mid RCA and 95% distal RCA s/p PCI of the mid to distal RCA and now on DAPT with ASA and Ticagrelor.     CORONARY STENT INTERVENTION Right 04/18/2018   Procedure: CORONARY STENT INTERVENTION;  Surgeon: Burnell Blanks, MD;  Location: [facility];  Service: Cardiovascular;  Laterality: Right;   DILATION AND CURETTAGE OF UTERUS     FRACTURE SURGERY     LAPAROSCOPIC CHOLECYSTECTOMY     LEFT HEART CATH AND CORONARY ANGIOGRAPHY N/A 04/18/2018   Procedure: LEFT HEART CATH AND CORONARY ANGIOGRAPHY;  Surgeon: Burnell Blanks, MD;  Location: [facility];  Service: Cardiovascular;  Laterality: N/A;   MUSCLE BIOPSY Left    "leg"   OOPHORECTOMY  07/2010   RADIOLOGY WITH ANESTHESIA N/A 05/25/2016   Procedure: RADIOLOGY WITH ANESTHESIA;  Surgeon: Medication Radiologist, MD;  Location: [facility];  Service: Radiology;  Laterality: N/A;   STUMP REVISION Right 06/29/2021   Procedure: REVISION RIGHT BELOW KNEE AMPUTATION;  Surgeon: Newt Minion,  MD;  Location: [facility];  Service: Orthopedics;  Laterality: Right;   TEE WITHOUT CARDIOVERSION N/A 06/01/2021   Procedure: TRANSESOPHAGEAL ECHOCARDIOGRAM (TEE);  Surgeon: Geralynn Rile, MD;  Location: [facility];  Service: Cardiovascular;  Laterality: N/A;   TONSILLECTOMY AND ADENOIDECTOMY     WRIST FRACTURE SURGERY Left    "crushed it"   Social History   Occupational History   Not on file  Tobacco Use   Smoking status: Former    Packs/day: 1.00    Years: 19.00    Pack years: 19.00    Types: Cigarettes    Start date: 23    Quit date: 02/11/2004    Years since quitting: 17.4   Smokeless tobacco: Never  Vaping Use   Vaping Use: Never used  Substance and Sexual Activity   Alcohol use: Yes    Comment: RARE Wine   Drug use: Not Currently    Types: Marijuana    Comment: "only in my teens"   Sexual activity: Yes    Birth control/protection: Surgical    Comment: Hysterectomy

## 2021-07-20 ENCOUNTER — Other Ambulatory Visit (HOSPITAL_COMMUNITY): Payer: Self-pay

## 2021-07-20 LAB — DRUG TOX MONITOR 1 W/CONF, ORAL FLD
Amphetamines: NEGATIVE ng/mL (ref <10)
Barbiturates: NEGATIVE ng/mL (ref <10)
Benzodiazepines: NEGATIVE ng/mL (ref <0.50)
Buprenorphine: NEGATIVE ng/mL (ref <0.10)
Cocaine: NEGATIVE ng/mL (ref <5.0)
Codeine: NEGATIVE ng/mL (ref <2.5)
Dihydrocodeine: NEGATIVE ng/mL (ref <2.5)
Fentanyl: NEGATIVE ng/mL (ref <0.10)
Heroin Metabolite: NEGATIVE ng/mL (ref <1.0)
Hydrocodone: NEGATIVE ng/mL (ref <2.5)
Hydromorphone: NEGATIVE ng/mL (ref <2.5)
MARIJUANA: NEGATIVE ng/mL (ref <2.5)
MDMA: NEGATIVE ng/mL (ref <10)
Meprobamate: NEGATIVE ng/mL (ref <2.5)
Methadone: NEGATIVE ng/mL (ref <5.0)
Morphine: NEGATIVE ng/mL (ref <2.5)
Nicotine Metabolite: NEGATIVE ng/mL (ref <5.0)
Norhydrocodone: NEGATIVE ng/mL (ref <2.5)
Noroxycodone: 33.2 ng/mL — ABNORMAL HIGH (ref <2.5)
Opiates: POSITIVE ng/mL — AB (ref <2.5)
Oxycodone: >250 ng/mL — ABNORMAL HIGH (ref <2.5)
Oxymorphone: 4.8 ng/mL — ABNORMAL HIGH (ref <2.5)
Phencyclidine: NEGATIVE ng/mL (ref <10)
Tapentadol: NEGATIVE ng/mL (ref <5.0)
Tramadol: >500 ng/mL — ABNORMAL HIGH (ref <5.0)
Tramadol: POSITIVE ng/mL — AB (ref <5.0)
Zolpidem: NEGATIVE ng/mL (ref <5.0)

## 2021-07-20 LAB — DRUG TOX ALC METAB W/CON, ORAL FLD: Alcohol Metabolite: NEGATIVE ng/mL (ref <25)

## 2021-07-21 ENCOUNTER — Other Ambulatory Visit: Payer: Self-pay

## 2021-07-21 ENCOUNTER — Telehealth: Payer: Self-pay

## 2021-07-21 NOTE — Progress Notes (Signed)
Anesthesia Chart Review: SAME DAY WORK-UP  Case: [id number] Date/Time: 07/22/21 1456   Procedure: RIGHT ABOVE KNEE AMPUTATION (Right: Knee)   Anesthesia type: Choice   Pre-op diagnosis: Dehiscence Right Below Knee Amputation   Location: [facility] OR ROOM 05 / [place] [facility]   Surgeons: Newt Minion, MD       DISCUSSION: Patient is a 60 year old female scheduled for the above procedure.  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 S/p revision 06/29/21 for dehiscence/necrosis right BKA but has had progressive wound dehiscence and not felt to have sufficeint residual limb to proceed with transtibial revision, so revision to right AKA recommended.Marland Kitchen   History includes former smoker (quit 02/11/04), HLD, CAD (s/p DES mid & distal RCA 10/01/15 in [facility]; DES ostial RCA 04/18/18 at [facility]), chronic diastolic CHF, murmur (mild-moderate AS 05/29/21 TTE, no AS 06/01/21 TEE), dysrhythmia (PVCs; brief afib with RVR in the setting of sepsis/bacteremia 10/2018, no anticoagulation unless recurrence), HTN, DM2, IBS, NAFLD, GERD, RA, mixed connective tissue disease, asthma, chronic pain syndrome, scoliosis, hypothyroidism, hysterectomy, osteomyelitis (s/p right 4th/5th toe amputation 01/28/20; 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 Very mild OSA by 2017 sleep study. BMI is consistent with morbid obesity.    [facility] admission 05/26/21 for sepsis from Enterococcus faecalis bacteremia secondary to cellulitis and chronic diabetic foot ulcer.  She was started on broad-spectrum antibiotic and underwent right BKA by Dr. Sharol Given on 06/01/21.  TEE done was negative for endocarditis or valvular disease.  She was discharged to [facility] for rehab 06/08/21-06/24/21. Unfortunately, right BKA site was concerning for early ischemia with erythema and dehiscence no responsive to local wound care and antibiotics. Ortho recommended I&D and revision of the right BKA site the following week post discharge. S/p right BKA revision 06/30/21.    Other than 06/01/21 TEE per Dr. Audie Box, last cardiology visit with Dr. Radford Pax was on 02/03/24. Non-ischemic stress test 02/22/21, EF 44%. Predominant SR with occasional PVC on June event monitor. Technically difficult TTE 05/29/21 showing LVEF 55-60% with mild-moderate AS with 06/01/21 TEE showed LVEF 60-65%, no regional wall motion abnormalities, normal RVSP, no LAA thrombus or evidenct of vegetation, trivial MR, mild AV sclerosis but no AS.  She is a same day work-up. Anesthesia team to evaluate on the day of surgery.    VS:  BP Readings from Last 3 Encounters:  07/15/21 128/73  07/05/21 118/62  07/01/21 (!) 146/68   Pulse Readings from Last 3 Encounters:  07/15/21 (!) 103  07/05/21 91  07/01/21 88     PROVIDERS: Shelda Pal, DO is PCP  Fransico Him, MD is cardiologist Delphia Grates, MD is rheumatologist ([facility]) Iran Planas, MD is endocrinologist ([facility])   LABS: Labs as indicated on the day of surgery. As of 07/05/21, Cr 1.39, H/H 9.0/29.4, PLT 264K, glucose 189. A1c 7.3% 05/27/21.    OTHER: Spirometry 10/30/16: FVC 2.43 (77%), FEV1 1.82 (73%), FEV1R 0.75 (95%), FEF 25-75 1.36 (56%).   Sleep study 02/13/16 (found under Media tab): IMPRESSIONS - Mild obstructive sleep apnea occurred during this study (AHI = 5.9/h). - No significant central sleep apnea occurred during this study (CAI = 0.0/h). - Mild oxygen desaturation was noted during this study (Min O2 = 86.00%). - The patient snored with Soft snoring volume. - No cardiac abnormalities were noted during this study. - Mild periodic limb movements of sleep occurred during the study. No significant associated arousals. RECOMMENDATIONS - Positional therapy avoiding supine position during sleep. - Very  mild obstructive sleep apnea. Return to discuss treatment options...     IMAGES: 1V PCXR 05/26/21: FINDINGS: Single frontal view of the chest demonstrates a stable cardiac silhouette. Chronic central vascular  congestion without airspace disease, effusion, or pneumothorax. Azygos fissure again noted. No acute bony abnormalities. IMPRESSION: 1. Chronic central vascular congestion.  No acute airspace disease.     EKG: 05/26/21: Sinus tachycardia at 120 bpm LAD, consider LAFB or inferior infarct Anterior infarct, old Minimal ST elevation, lateral leads When compared to prior, similar tachycardia with less PVC and less wandering baseline. No STEMI Confirmed by Antony Blackbird [phone]) on 05/26/2021 5:51:56 PM     CV: TEE 06/01/21: IMPRESSIONS   1. Left ventricular ejection fraction, by estimation, is 60 to 65%. The  left ventricle has normal function. The left ventricle has no regional  wall motion abnormalities.   2. Right ventricular systolic function is normal. The right ventricular  size is normal.   3. No left atrial/left atrial appendage thrombus was detected.   4. The mitral valve is grossly normal. Trivial mitral valve  regurgitation. No evidence of mitral stenosis.   5. The aortic valve is tricuspid. Aortic valve regurgitation is not  visualized. Mild aortic valve sclerosis is present, with no evidence of  aortic valve stenosis.   6. There is mild (Grade II) layered plaque involving the descending aorta  and transverse aorta.  - Conclusion(s)/Recommendation(s): No evidence of vegetation/infective  endocarditis on this transesophageal  echocardiogram.      Echo/TTE 05/29/21: Sonographer Comments: Technically challenging study due to limited  acoustic windows, Technically difficult study due to poor echo windows,  suboptimal parasternal window, suboptimal apical window, suboptimal  subcostal window and patient is morbidly obese.  Image acquisition challenging due to patient body habitus.  IMPRESSIONS   1. An aortic valve gradient was measured. The valve cannot be seen well  enough to make any further comments.   2. Technically difficult echo with poor image quality. Using  Definity  contrast, the LV systolic function can be seen to be well preserved.   3. Left ventricular ejection fraction, by estimation, is 55 to 60%. The  left ventricle has normal function. Left ventricular endocardial border  not optimally defined to evaluate regional wall motion. Left ventricular  diastolic function could not be  evaluated.   4. Right ventricular systolic function was not well visualized. The right  ventricular size is not well visualized.   5. The mitral valve was not well visualized. No evidence of mitral valve  regurgitation.   6. The aortic valve was not well visualized. Aortic valve regurgitation  is not visualized. Mild to moderate aortic valve stenosis.      Cardiac Event Monitor 02/15/21-03/12/21: Predominant rhythm was normal sinus rhythm with average heart rate 90bpm and ranged from 74 to 132bpm Occasionaly PVCs    Nuclear stress 02/22/21: Nuclear stress EF: 44%. Visually, the LV EF appears to be greater than the computer calculated 44% and appears normal . There was no ST segment deviation noted during stress. This is a low risk study. There is no evidence of ischemia and no evidence of previous infarction The study is normal.     Cath and PCI 04/18/18: 1. Severe stenosis ostial RCA. 2. Patent stents mid and distal RCA with minimal restenosis.  3. Mild non-obstructive disease in the LAD and Circumflex 4. Successful PTCA/DES x 1 ostial RCA   Recommendations:  Recommend uninterrupted dual antiplatelet therapy with Aspirin 81mg  daily and Clopidogrel 75mg  daily  for a minimum of 6 months (stable ischemic heart disease - Class I recommendation). If she tolerates, DAPT, would continue for one year.    Past Medical History:  Diagnosis Date   Aortic stenosis    mild AS by echo 02/2021   Asthma    "on daily RX and rescue inhaler" (04/18/2018)   Chronic diastolic CHF (congestive heart failure) (HCC) 09/2015   Chronic lower back pain    Chronic neck pain     Chronic pain syndrome    Fentanyl Patch   Colon polyps    Coronary artery disease    cath with normal LM, 30% LAD, 85% mid RCA and 95% distal RCA s/p PCI of the mid to distal RCA and now on DAPT with ASA and Ticagrelor.     Eczema    Excessive daytime sleepiness 11/26/2015   Fibromyalgia    Gallstones    GERD (gastroesophageal reflux disease)    Heart murmur    "noted for the 1st time on 04/18/2018"   History of blood transfusion 07/2010   "S/P oophorectomy"   History of gout    History of hiatal hernia 1980s   "gone now" (04/18/2018)   Hyperlipidemia    Hypertension    takes Metoprolol and Enalapril daily   Hypothyroidism    takes Synthroid daily   IBS (irritable bowel syndrome)    Migraine    "nothing in the 2000s" (04/18/2018)   Mixed connective tissue disease (HCC)    NAFLD (nonalcoholic fatty liver disease)    Pneumonia    "several times" (04/18/2018)   PVC's (premature ventricular contractions)    noted on event monitor 02/2021   Rheumatoid arthritis (HCC)    "hands, elbows, shoulders, probably knees" (04/18/2018)   Scoliosis    Sleep apnea    mild - does not use cpap   Spondylosis    Type II diabetes mellitus (HCC)    takes Metformin and Hum R daily (04/18/2018)   Walker as ambulation aid    also uses wheelchair    Past Surgical History:  Procedure Laterality Date   ABDOMINAL HYSTERECTOMY  06/2005   "w/right ovariy"   AMPUTATION Right 01/28/2020    RIGHT FOURTH AND FIFTH RAY AMPUTATION   AMPUTATION Right 01/28/2020   Procedure: RIGHT FOURTH AND FIFTH RAY AMPUTATION,;  Surgeon: [name], MD;  Location: [facility] OR;  Service: Orthopedics;  Laterality: Right;   AMPUTATION Right 06/01/2021   Procedure: RIGHT BELOW KNEE AMPUTATION;  Surgeon: [name], MD;  Location: [facility] OR;  Service: Orthopedics;  Laterality: Right;   APPENDECTOMY     BREAST BIOPSY Bilateral    9 total (04/18/2018)   CORONARY ANGIOPLASTY WITH STENT PLACEMENT  10/01/2015   normal LM, 30% LAD, 85% mid RCA  and 95% distal RCA s/p PCI of the mid to distal RCA and now on DAPT with ASA and Ticagrelor.     CORONARY STENT INTERVENTION Right 04/18/2018   Procedure: CORONARY STENT INTERVENTION;  Surgeon: [name], MD;  Location: [facility] INVASIVE CV LAB;  Service: Cardiovascular;  Laterality: Right;   DILATION AND CURETTAGE OF UTERUS     FRACTURE SURGERY     LAPAROSCOPIC CHOLECYSTECTOMY     LEFT HEART CATH AND CORONARY ANGIOGRAPHY N/A 04/18/2018   Procedure: LEFT HEART CATH AND CORONARY ANGIOGRAPHY;  Surgeon: [name], MD;  Location: [facility] CV [facility];  Service: Cardiovascular;  Laterality: N/A;   MUSCLE BIOPSY Left    "leg"   OOPHORECTOMY  07/2010  RADIOLOGY WITH ANESTHESIA N/A 05/25/2016   Procedure: RADIOLOGY WITH ANESTHESIA;  Surgeon: Medication Radiologist, MD;  Location: [place];  Service: Radiology;  Laterality: N/A;   STUMP REVISION Right 06/29/2021   Procedure: REVISION RIGHT BELOW KNEE AMPUTATION;  Surgeon: Newt Minion, MD;  Location: [place];  Service: Orthopedics;  Laterality: Right;   TEE WITHOUT CARDIOVERSION N/A 06/01/2021   Procedure: TRANSESOPHAGEAL ECHOCARDIOGRAM (TEE);  Surgeon: Geralynn Rile, MD;  Location: [place];  Service: Cardiovascular;  Laterality: N/A;   TONSILLECTOMY AND ADENOIDECTOMY     WRIST FRACTURE SURGERY Left    "crushed it"    MEDICATIONS: No current facility-administered medications for this encounter.    Acetylcysteine, Nutrient, (N-ACETYL CYSTEINE) 600 MG TABS   albuterol (PROVENTIL) (2.5 MG/3ML) 0.083% nebulizer solution   albuterol (VENTOLIN HFA) 108 (90 Base) MCG/ACT inhaler   ascorbic acid (VITAMIN C) 500 MG tablet   aspirin EC 81 MG tablet   betamethasone dipropionate 0.05 % cream   camphor-menthol (SARNA) lotion   cyclobenzaprine (FLEXERIL) 10 MG tablet   diclofenac Sodium (VOLTAREN) 1 % GEL   diltiazem (CARDIZEM CD) 240 MG 24 hr capsule   diphenhydrAMINE (BENADRYL) 25 MG tablet   doxycycline (VIBRA-TABS) 100 MG tablet    EPINEPHrine (EPIPEN 2-PAK) 0.3 mg/0.3 mL IJ SOAJ injection   ezetimibe (ZETIA) 10 MG tablet   fluconazole (DIFLUCAN) 150 MG tablet   fluticasone (FLONASE) 50 MCG/ACT nasal spray   folic acid (FOLVITE) [id number] MCG tablet   furosemide (LASIX) 20 MG tablet   gabapentin (NEURONTIN) 400 MG capsule   GLUCOMANNAN PO   hydrocortisone cream 1 %   hydroxychloroquine (PLAQUENIL) 200 MG tablet   insulin regular human CONCENTRATED (HUMULIN R) 500 UNIT/ML injection   isosorbide mononitrate (IMDUR) 30 MG 24 hr tablet   levocetirizine (XYZAL) 5 MG tablet   levothyroxine (SYNTHROID) 200 MCG tablet   metFORMIN (GLUCOPHAGE-XR) 500 MG 24 hr tablet   methocarbamol (ROBAXIN) 750 MG tablet   mupirocin ointment (BACTROBAN) 2 %   naloxone (NARCAN) nasal spray 4 mg/0.1 mL   nitroGLYCERIN (NITROSTAT) 0.4 MG SL tablet   omeprazole (PRILOSEC) 40 MG capsule   Oxycodone HCl 20 MG TABS   oxymetazoline (AFRIN) 0.05 % nasal spray   Polyethyl Glycol-Propyl Glycol (SYSTANE) 0.4-0.3 % SOLN   POT BICARB-POT CHLORIDE,25MEQ, 25 MEQ TBEF   pramipexole (MIRAPEX) 1 MG tablet   pregabalin (LYRICA) 25 MG capsule   promethazine (PHENERGAN) 25 MG tablet   saccharomyces boulardii (FLORASTOR) 250 MG capsule   tiZANidine (ZANAFLEX) 2 MG tablet   traMADol (ULTRAM) 50 MG tablet   valsartan (DIOVAN) 160 MG tablet   vitamin B-12 (CYANOCOBALAMIN) 1000 MCG tablet   Vitamin D, Ergocalciferol, (DRISDOL) 1.25 MG (50000 UNIT) CAPS capsule   Zinc Sulfate 220 (50 Zn) MG TABS   cephALEXin (KEFLEX) 500 MG capsule   collagenase (SANTYL) ointment   glucose blood test strip   INSULIN SYRINGE 1CC/29G (B-D INSULIN SYRINGE) 29G X 1/2" 1 ML MISC   lidocaine (LIDODERM) 5 %    Myra Gianotti, PA-C Surgical Short Stay/Anesthesiology [facility] Phone [phone] [facility] Phone [phone] 07/21/2021 4:39 PM

## 2021-07-21 NOTE — Anesthesia Preprocedure Evaluation (Addendum)
Anesthesia Evaluation  Patient identified by MRN, date of birth, ID band Patient awake    Reviewed: Allergy & Precautions, NPO status , Patient's Chart, lab work & pertinent test results  Airway Mallampati: III  TM Distance: >3 FB Neck ROM: Full    Dental no notable dental hx. (+) Poor Dentition, Teeth Intact,    Pulmonary asthma , sleep apnea , former smoker,    Pulmonary exam normal breath sounds clear to auscultation       Cardiovascular hypertension, + CAD and +CHF  Normal cardiovascular exam Rhythm:Regular Rate:Normal  06/01/21 TTE  1. Left ventricular ejection fraction, by estimation, is 60 to 65%. The  left ventricle has normal function. The left ventricle has no regional  wall motion abnormalities.  2. Right ventricular systolic function is normal. The right ventricular  size is normal.  3. No left atrial/left atrial appendage thrombus was detected.  4. The mitral valve is grossly normal. Trivial mitral valve  regurgitation. No evidence of mitral stenosis.  5. The aortic valve is tricuspid. Aortic valve regurgitation is not  visualized. Mild aortic valve sclerosis is present, with no evidence of  aortic valve stenosis.  6. There is mild (Grade II) layered plaque involving the descending aorta  and transverse aorta.    Neuro/Psych    GI/Hepatic GERD  ,  Endo/Other  diabetes, Type 2Hypothyroidism Morbid obesity (BMI 53.95)  Renal/GU      Musculoskeletal  (+) Fibromyalgia -, narcotic dependent  Abdominal   Peds  Hematology Lab Results      Component                Value               Date                      WBC                      8.8                 07/05/2021                HGB                      9.0 (L)             07/05/2021                HCT                      29.4 (L)            07/05/2021                MCV                      82.0                07/05/2021                PLT                       264.0               07/05/2021              Anesthesia Other Findings Has paradoxical to benzodiazepines  All See list  Reproductive/Obstetrics  Anesthesia Evaluation  Patient identified by MRN, date of birth, ID band Patient awake    Reviewed: Allergy & Precautions, NPO status , Patient's Chart, lab work & pertinent test results  Airway Mallampati: III  TM Distance: >3 FB Neck ROM: Full    Dental no notable dental hx. (+) Poor Dentition, Missing, Dental Advisory Given, Chipped,    Pulmonary asthma , former smoker,  Quit smoking 2005, 19 pack year history    Pulmonary exam normal breath sounds clear to auscultation       Cardiovascular hypertension, Pt. on medications + CAD, + Cardiac Stents and +CHF  Normal cardiovascular exam+ Valvular Problems/Murmurs (mild to mod AS) AS  Rhythm:Regular Rate:Normal  Echo 05/2021: 1. An aortic valve gradient was measured. The valve cannot be seen well  enough to make any further comments.  2. Technically difficult echo with poor image quality. Using Definity  contrast, the LV systolic function can be seen to be well preserved.  3. Left ventricular ejection fraction, by estimation, is 55 to 60%. The  left ventricle has normal function. Left ventricular endocardial border  not optimally defined to evaluate regional wall motion. Left ventricular  diastolic function could not be  evaluated.  4. Right ventricular systolic function was not well visualized. The right  ventricular size is not well visualized.  5. The mitral valve was not well visualized. No evidence of mitral valve  regurgitation.  6. The aortic valve was not well visualized. Aortic valve regurgitation  is not visualized. Mild to moderate aortic valve stenosis.   Cath 2019: 1. Severe stenosis ostial RCA. 2. Patent stents mid and distal RCA with minimal restenosis.   3. Mild non-obstructive disease in the LAD and Circumflex 4. Successful PTCA/DES x 1 ostial RCA  Recommendations:  Recommend uninterrupted dual antiplatelet therapy with Aspirin 81mg  daily and Clopidogrel 75mg  daily for a minimum of 6 months (stable ischemic heart disease - Class I recommendation). If she tolerates, DAPT, would continue for one year.    Stress test 2022:  Nuclear stress EF: 44%. Visually, the LV EF appears to be greater than the computer calculated 44% and appears normal .  There was no ST segment deviation noted during stress.  This is a low risk study. There is no evidence of ischemia and no evidence of previous infarction  The study is normal.      Neuro/Psych  Headaches, negative psych ROS   GI/Hepatic Neg liver ROS, hiatal hernia, GERD  Controlled and Medicated,IBS   Endo/Other  diabetes, Well Controlled, Type 2, Oral Hypoglycemic Agents, Insulin DependentHypothyroidism Morbid obesityBMI 60 a1c 7.3  Renal/GU Renal InsufficiencyRenal diseaseCr 1.02  negative genitourinary   Musculoskeletal  (+) Arthritis , Rheumatoid disorders,  Fibromyalgia -, narcotic dependentChronic LBP   Abdominal (+) + obese,   Peds  Hematology negative hematology ROS (+)   Anesthesia Other Findings Chronic pain: fent patch, oxy 10mg   Reproductive/Obstetrics negative OB ROS                            Anesthesia Physical Anesthesia Plan  ASA: 4  Anesthesia Plan: General and Regional   Post-op Pain Management: GA combined w/ Regional for post-op pain   Induction: Intravenous  PONV Risk Score and Plan: 3 and Ondansetron, Dexamethasone, Midazolam and Treatment may vary due to age or medical condition  Airway Management Planned: LMA  Additional Equipment: None  Intra-op Plan:   Post-operative Plan: Extubation in OR  Informed Consent:  I have reviewed the patients History and Physical, chart, labs and discussed the procedure including the  risks, benefits and alternatives for the proposed anesthesia with the patient or authorized representative who has indicated his/her understanding and acceptance.     Dental advisory given  Plan Discussed with: CRNA  Anesthesia Plan Comments:        Anesthesia Quick Evaluation  Anesthesia Physical Anesthesia Plan  ASA: 4  Anesthesia Plan: General   Post-op Pain Management:    Induction: Intravenous  PONV Risk Score and Plan: 4 or greater and Treatment may vary due to age or medical condition, Ondansetron and Dexamethasone  Airway Management Planned: Oral ETT  Additional Equipment: None  Intra-op Plan:   Post-operative Plan: Extubation in OR  Informed Consent: I have reviewed the patients History and Physical, chart, labs and discussed the procedure including the risks, benefits and alternatives for the proposed anesthesia with the patient or authorized representative who has indicated his/her understanding and acceptance.     Dental advisory given  Plan Discussed with: CRNA  Anesthesia Plan Comments: (PAT note written 07/21/2021 by Myra Gianotti, PA-C. )      Anesthesia Quick Evaluation

## 2021-07-21 NOTE — Telephone Encounter (Signed)
[employer] denied Lidocaine patches

## 2021-07-21 NOTE — Progress Notes (Signed)
DUE TO COVID-19 ONLY ONE VISITOR IS ALLOWED TO COME WITH YOU AND STAY IN THE WAITING ROOM ONLY DURING PRE OP AND PROCEDURE DAY OF SURGERY.   Two  VISITORS  MAY VISIT WITH YOU AFTER SURGERY IN YOUR PRIVATE ROOM DURING VISITING HOURS ONLY!   PCP - Raphael Gibney, DO Cardiologist - Dr Armanda Magic Rheumatologist - Dr Hedwig Morton  Chest x-ray - 05/26/21 (1V) EKG - 05/26/21 Stress Test - 02/22/21 ECHO - 05/29/21 Cardiac Cath - 04/29/18  ICD Pacemaker/Loop - n/a  Sleep Study -  Yes CPAP - none - very mild sleep apnea, patient told to avoid supine position when sleeping.  Fasting Blood Sugar - 90s-170s Checks Blood Sugar 4 times a day  Do not take metformin on the morning of surgery.      THE MORNING OF SURGERY, do not take Humulin R Insulin unless your CBG is greater than 220 mg/dL, then you may take  of your sliding scale (correction) dose of insulin.   If your blood sugar is less than 70 mg/dL, you will need to treat for low blood sugar: Treat a low blood sugar (less than 70 mg/dL) with  cup of clear juice (cranberry or apple), 4 glucose tablets, OR glucose gel. Recheck blood sugar in 15 minutes after treatment (to make sure it is greater than 70 mg/dL). If your blood sugar is not greater than 70 mg/dL on recheck, call [phone] for further instructions.  Aspirin Instructions: Follow your surgeon's instructions on when to stop aspirin prior to surgery,  If no instructions were given by your surgeon then you will need to call the office for those instructions.  ERAS: Clear liquids til 12 noon on DOS.  Clear liquids reviewed with patient.   Anesthesia review: Yes  STOP now taking any Aspirin (unless otherwise instructed by your surgeon), Aleve, Naproxen, Ibuprofen, Motrin, Advil, Goody's, BC's, all herbal medications, fish oil, and all vitamins.   Coronavirus Screening Covid test is scheduled on DOS. Do you have any of the following symptoms:  Cough yes/no: No Fever (>100.8F)   yes/no: No Runny nose yes/no: No Sore throat yes/no: No Difficulty breathing/shortness of breath  yes/no: No  Have you traveled in the last 14 days and where? yes/no: No  Patient verbalized understanding of instructions that were given via phone.

## 2021-07-22 ENCOUNTER — Inpatient Hospital Stay (HOSPITAL_COMMUNITY): Payer: 59 | Admitting: Vascular Surgery

## 2021-07-22 ENCOUNTER — Inpatient Hospital Stay (HOSPITAL_COMMUNITY)
Admission: RE | Admit: 2021-07-22 | Discharge: 2021-07-29 | DRG: 475 | Disposition: A | Discharge status: Home / Self Care | Payer: 59 | Attending: Orthopedic Surgery | Admitting: Orthopedic Surgery

## 2021-07-22 ENCOUNTER — Encounter (HOSPITAL_COMMUNITY): Payer: Self-pay | Admitting: Orthopedic Surgery

## 2021-07-22 ENCOUNTER — Encounter (HOSPITAL_COMMUNITY)
Admission: RE | Disposition: A | Discharge status: Home / Self Care | Payer: Self-pay | Source: Home / Self Care | Attending: Orthopedic Surgery

## 2021-07-22 DIAGNOSIS — Z91048 Other nonmedicinal substance allergy status: Secondary | ICD-10-CM

## 2021-07-22 DIAGNOSIS — E669 Obesity, unspecified: Secondary | ICD-10-CM

## 2021-07-22 DIAGNOSIS — I110 Hypertensive heart disease with heart failure: Secondary | ICD-10-CM | POA: Diagnosis present

## 2021-07-22 DIAGNOSIS — E785 Hyperlipidemia, unspecified: Secondary | ICD-10-CM | POA: Diagnosis not present

## 2021-07-22 DIAGNOSIS — E039 Hypothyroidism, unspecified: Secondary | ICD-10-CM | POA: Diagnosis present

## 2021-07-22 DIAGNOSIS — M109 Gout, unspecified: Secondary | ICD-10-CM | POA: Diagnosis present

## 2021-07-22 DIAGNOSIS — Z955 Presence of coronary angioplasty implant and graft: Secondary | ICD-10-CM

## 2021-07-22 DIAGNOSIS — Z825 Family history of asthma and other chronic lower respiratory diseases: Secondary | ICD-10-CM

## 2021-07-22 DIAGNOSIS — K760 Fatty (change of) liver, not elsewhere classified: Secondary | ICD-10-CM | POA: Diagnosis not present

## 2021-07-22 DIAGNOSIS — M797 Fibromyalgia: Secondary | ICD-10-CM | POA: Diagnosis present

## 2021-07-22 DIAGNOSIS — Z87891 Personal history of nicotine dependence: Secondary | ICD-10-CM | POA: Diagnosis not present

## 2021-07-22 DIAGNOSIS — Z8249 Family history of ischemic heart disease and other diseases of the circulatory system: Secondary | ICD-10-CM

## 2021-07-22 DIAGNOSIS — E119 Type 2 diabetes mellitus without complications: Secondary | ICD-10-CM | POA: Diagnosis not present

## 2021-07-22 DIAGNOSIS — M069 Rheumatoid arthritis, unspecified: Secondary | ICD-10-CM | POA: Diagnosis not present

## 2021-07-22 DIAGNOSIS — G4730 Sleep apnea, unspecified: Secondary | ICD-10-CM | POA: Diagnosis present

## 2021-07-22 DIAGNOSIS — Z888 Allergy status to other drugs, medicaments and biological substances status: Secondary | ICD-10-CM | POA: Diagnosis not present

## 2021-07-22 DIAGNOSIS — T8781 Dehiscence of amputation stump: Secondary | ICD-10-CM | POA: Diagnosis not present

## 2021-07-22 DIAGNOSIS — Z20822 Contact with and (suspected) exposure to covid-19: Secondary | ICD-10-CM | POA: Diagnosis not present

## 2021-07-22 DIAGNOSIS — Y835 Amputation of limb(s) as the cause of abnormal reaction of the patient, or of later complication, without mention of misadventure at the time of the procedure: Secondary | ICD-10-CM | POA: Diagnosis present

## 2021-07-22 DIAGNOSIS — S88111A Complete traumatic amputation at level between knee and ankle, right lower leg, initial encounter: Secondary | ICD-10-CM

## 2021-07-22 DIAGNOSIS — Z9861 Coronary angioplasty status: Secondary | ICD-10-CM

## 2021-07-22 DIAGNOSIS — Z91013 Allergy to seafood: Secondary | ICD-10-CM

## 2021-07-22 DIAGNOSIS — E1169 Type 2 diabetes mellitus with other specified complication: Secondary | ICD-10-CM

## 2021-07-22 DIAGNOSIS — I5032 Chronic diastolic (congestive) heart failure: Secondary | ICD-10-CM | POA: Diagnosis not present

## 2021-07-22 DIAGNOSIS — J45909 Unspecified asthma, uncomplicated: Secondary | ICD-10-CM | POA: Diagnosis present

## 2021-07-22 DIAGNOSIS — G894 Chronic pain syndrome: Secondary | ICD-10-CM | POA: Diagnosis not present

## 2021-07-22 DIAGNOSIS — I2510 Atherosclerotic heart disease of native coronary artery without angina pectoris: Secondary | ICD-10-CM | POA: Diagnosis present

## 2021-07-22 DIAGNOSIS — Z89611 Acquired absence of right leg above knee: Secondary | ICD-10-CM

## 2021-07-22 HISTORY — PX: AMPUTATION: SHX166

## 2021-07-22 LAB — GLUCOSE, CAPILLARY
Glucose-Capillary: 156 mg/dL — ABNORMAL HIGH (ref 70–99)
Glucose-Capillary: 137 mg/dL — ABNORMAL HIGH (ref 70–99)
Glucose-Capillary: 161 mg/dL — ABNORMAL HIGH (ref 70–99)
Glucose-Capillary: 194 mg/dL — ABNORMAL HIGH (ref 70–99)

## 2021-07-22 LAB — SURGICAL PCR SCREEN
MRSA, PCR: POSITIVE — AB
Staphylococcus aureus: POSITIVE — AB

## 2021-07-22 LAB — SARS CORONAVIRUS 2 BY RT PCR (HOSPITAL ORDER, PERFORMED IN ~~LOC~~ HOSPITAL LAB): SARS Coronavirus 2: NEGATIVE

## 2021-07-22 SURGERY — AMPUTATION, ABOVE KNEE
Anesthesia: General | Site: Knee | Laterality: Right

## 2021-07-22 MED ORDER — KETAMINE HCL 10 MG/ML IJ SOLN
INTRAMUSCULAR | Status: DC | PRN
  Administered 2021-07-22: 20 mg via INTRAVENOUS

## 2021-07-22 MED ORDER — LEVOTHYROXINE SODIUM 100 MCG PO TABS
200.0000 ug | ORAL_TABLET | Freq: Every day | ORAL | Status: DC
  Administered 2021-07-23 – 2021-07-29 (×7): 200 ug via ORAL
  Filled 2021-07-22: qty 4
  Filled 2021-07-22 (×3): qty 2
  Filled 2021-07-22: qty 4
  Filled 2021-07-22: qty 1
  Filled 2021-07-22: qty 2

## 2021-07-22 MED ORDER — BISACODYL 5 MG PO TBEC
5.0000 mg | DELAYED_RELEASE_TABLET | Freq: Every day | ORAL | Status: DC | PRN
  Administered 2021-07-27: 5 mg via ORAL
  Filled 2021-07-22: qty 1

## 2021-07-22 MED ORDER — POLYVINYL ALCOHOL 1.4 % OP SOLN
1.0000 [drp] | Freq: Every morning | OPHTHALMIC | Status: DC
  Administered 2021-07-23 – 2021-07-24 (×2): 1 [drp] via OPHTHALMIC
  Administered 2021-07-25: 2 [drp] via OPHTHALMIC
  Administered 2021-07-26 – 2021-07-27 (×2): 1 [drp] via OPHTHALMIC
  Administered 2021-07-28: 11:00:00 2 [drp] via OPHTHALMIC
  Administered 2021-07-29: 1 [drp] via OPHTHALMIC
  Filled 2021-07-22: qty 15

## 2021-07-22 MED ORDER — PANTOPRAZOLE SODIUM 40 MG PO TBEC
40.0000 mg | DELAYED_RELEASE_TABLET | Freq: Every day | ORAL | Status: DC
  Administered 2021-07-22 – 2021-07-29 (×8): 40 mg via ORAL
  Filled 2021-07-22 (×8): qty 1

## 2021-07-22 MED ORDER — PHENOL 1.4 % MT LIQD: 1.0000 | OROMUCOSAL | Status: DC | PRN

## 2021-07-22 MED ORDER — VANCOMYCIN HCL 1500 MG/300ML IV SOLN
1500.0000 mg | INTRAVENOUS | Status: DC
  Filled 2021-07-22: qty 300

## 2021-07-22 MED ORDER — ONDANSETRON HCL 4 MG/2ML IJ SOLN
4.0000 mg | Freq: Four times a day (QID) | INTRAMUSCULAR | Status: DC | PRN
  Administered 2021-07-24 – 2021-07-27 (×6): 4 mg via INTRAVENOUS
  Filled 2021-07-22 (×6): qty 2

## 2021-07-22 MED ORDER — ISOSORBIDE MONONITRATE ER 30 MG PO TB24
30.0000 mg | ORAL_TABLET | Freq: Every day | ORAL | Status: DC
  Administered 2021-07-22 – 2021-07-28 (×7): 30 mg via ORAL
  Filled 2021-07-22 (×7): qty 1

## 2021-07-22 MED ORDER — LEVOCETIRIZINE DIHYDROCHLORIDE 5 MG PO TABS: 5.0000 mg | ORAL_TABLET | Freq: Every evening | ORAL | Status: DC

## 2021-07-22 MED ORDER — ASPIRIN EC 325 MG PO TBEC
325.0000 mg | DELAYED_RELEASE_TABLET | Freq: Every day | ORAL | Status: DC
  Administered 2021-07-23 – 2021-07-29 (×7): 325 mg via ORAL
  Filled 2021-07-22 (×7): qty 1

## 2021-07-22 MED ORDER — PHENYLEPHRINE HCL-NACL 20-0.9 MG/250ML-% IV SOLN
INTRAVENOUS | Status: AC
  Filled 2021-07-22: qty 750

## 2021-07-22 MED ORDER — PRAMIPEXOLE DIHYDROCHLORIDE 1 MG PO TABS
1.0000 mg | ORAL_TABLET | Freq: Three times a day (TID) | ORAL | Status: DC
  Administered 2021-07-22 – 2021-07-29 (×20): 1 mg via ORAL
  Filled 2021-07-22 (×22): qty 1

## 2021-07-22 MED ORDER — CHLORHEXIDINE GLUCONATE 4 % EX LIQD: 60.0000 mL | Freq: Once | CUTANEOUS | Status: DC

## 2021-07-22 MED ORDER — SUGAMMADEX SODIUM 200 MG/2ML IV SOLN
INTRAVENOUS | Status: DC | PRN
  Administered 2021-07-22: 264 mg via INTRAVENOUS

## 2021-07-22 MED ORDER — VANCOMYCIN HCL 1000 MG/200ML IV SOLN
1000.0000 mg | INTRAVENOUS | Status: DC
  Filled 2021-07-22: qty 200

## 2021-07-22 MED ORDER — METFORMIN HCL ER 500 MG PO TB24
1000.0000 mg | ORAL_TABLET | Freq: Two times a day (BID) | ORAL | Status: DC
  Administered 2021-07-23 – 2021-07-29 (×13): 1000 mg via ORAL
  Filled 2021-07-22 (×13): qty 2

## 2021-07-22 MED ORDER — ASCORBIC ACID 500 MG PO TABS
1000.0000 mg | ORAL_TABLET | Freq: Every day | ORAL | Status: DC
  Administered 2021-07-22 – 2021-07-29 (×8): 1000 mg via ORAL
  Filled 2021-07-22 (×9): qty 2

## 2021-07-22 MED ORDER — IRBESARTAN 75 MG PO TABS
37.5000 mg | ORAL_TABLET | Freq: Every day | ORAL | Status: DC
  Administered 2021-07-22 – 2021-07-29 (×8): 37.5 mg via ORAL
  Filled 2021-07-22 (×8): qty 0.5

## 2021-07-22 MED ORDER — CEFAZOLIN SODIUM-DEXTROSE 2-4 GM/100ML-% IV SOLN
2.0000 g | Freq: Three times a day (TID) | INTRAVENOUS | Status: AC
  Administered 2021-07-22 – 2021-07-23 (×2): 2 g via INTRAVENOUS
  Filled 2021-07-22 (×2): qty 100

## 2021-07-22 MED ORDER — PROPOFOL 10 MG/ML IV BOLUS
INTRAVENOUS | Status: DC | PRN
  Administered 2021-07-22: 200 mg via INTRAVENOUS

## 2021-07-22 MED ORDER — CYCLOBENZAPRINE HCL 10 MG PO TABS
10.0000 mg | ORAL_TABLET | Freq: Three times a day (TID) | ORAL | Status: DC | PRN
  Administered 2021-07-22 – 2021-07-29 (×7): 10 mg via ORAL
  Filled 2021-07-22 (×7): qty 1

## 2021-07-22 MED ORDER — GUAIFENESIN-DM 100-10 MG/5ML PO SYRP: 15.0000 mL | ORAL_SOLUTION | ORAL | Status: DC | PRN

## 2021-07-22 MED ORDER — HYDROMORPHONE HCL 1 MG/ML IJ SOLN
INTRAMUSCULAR | Status: AC
  Filled 2021-07-22: qty 2

## 2021-07-22 MED ORDER — INSULIN ASPART 100 UNIT/ML IJ SOLN
0.0000 [IU] | Freq: Three times a day (TID) | INTRAMUSCULAR | Status: DC
  Administered 2021-07-23 (×3): 4 [IU] via SUBCUTANEOUS
  Administered 2021-07-24 (×3): 7 [IU] via SUBCUTANEOUS
  Administered 2021-07-25 (×2): 4 [IU] via SUBCUTANEOUS
  Administered 2021-07-25: 7 [IU] via SUBCUTANEOUS
  Administered 2021-07-26: 4 [IU] via SUBCUTANEOUS
  Administered 2021-07-26: 7 [IU] via SUBCUTANEOUS
  Administered 2021-07-26 – 2021-07-28 (×5): 4 [IU] via SUBCUTANEOUS
  Administered 2021-07-28: 10:00:00 11 [IU] via SUBCUTANEOUS
  Administered 2021-07-28 – 2021-07-29 (×2): 7 [IU] via SUBCUTANEOUS

## 2021-07-22 MED ORDER — CHLORHEXIDINE GLUCONATE 0.12 % MT SOLN
OROMUCOSAL | Status: AC
  Administered 2021-07-22: 15 mL via OROMUCOSAL
  Filled 2021-07-22: qty 15

## 2021-07-22 MED ORDER — HYDROMORPHONE HCL 1 MG/ML IJ SOLN
INTRAMUSCULAR | Status: AC
  Filled 2021-07-22: qty 1

## 2021-07-22 MED ORDER — FLUCONAZOLE 150 MG PO TABS
150.0000 mg | ORAL_TABLET | ORAL | Status: DC
  Administered 2021-07-23 – 2021-07-29 (×3): 150 mg via ORAL
  Filled 2021-07-22 (×4): qty 1

## 2021-07-22 MED ORDER — SODIUM CHLORIDE 0.9 % IV SOLN: INTRAVENOUS | Status: DC

## 2021-07-22 MED ORDER — SUCCINYLCHOLINE CHLORIDE 200 MG/10ML IV SOSY
PREFILLED_SYRINGE | INTRAVENOUS | Status: AC
  Filled 2021-07-22: qty 10

## 2021-07-22 MED ORDER — POTASSIUM CHLORIDE CRYS ER 20 MEQ PO TBCR: 20.0000 meq | EXTENDED_RELEASE_TABLET | Freq: Every day | ORAL | Status: DC | PRN

## 2021-07-22 MED ORDER — 0.9 % SODIUM CHLORIDE (POUR BTL) OPTIME
TOPICAL | Status: DC | PRN
  Administered 2021-07-22: 1000 mL

## 2021-07-22 MED ORDER — OXYCODONE HCL 5 MG PO TABS
ORAL_TABLET | ORAL | Status: AC
  Filled 2021-07-22: qty 3

## 2021-07-22 MED ORDER — DOCUSATE SODIUM 100 MG PO CAPS
100.0000 mg | ORAL_CAPSULE | Freq: Every day | ORAL | Status: DC
  Administered 2021-07-23 – 2021-07-28 (×6): 100 mg via ORAL
  Filled 2021-07-22 (×7): qty 1

## 2021-07-22 MED ORDER — ACETAMINOPHEN 10 MG/ML IV SOLN
1000.0000 mg | Freq: Once | INTRAVENOUS | Status: DC | PRN
  Administered 2021-07-22: 1000 mg via INTRAVENOUS

## 2021-07-22 MED ORDER — FENTANYL CITRATE (PF) 250 MCG/5ML IJ SOLN
INTRAMUSCULAR | Status: AC
  Filled 2021-07-22: qty 5

## 2021-07-22 MED ORDER — LABETALOL HCL 5 MG/ML IV SOLN: 10.0000 mg | INTRAVENOUS | Status: DC | PRN

## 2021-07-22 MED ORDER — ZINC SULFATE 220 (50 ZN) MG PO CAPS: 220.0000 mg | ORAL_CAPSULE | Freq: Every day | ORAL | Status: DC

## 2021-07-22 MED ORDER — HYDROMORPHONE HCL 1 MG/ML IJ SOLN
0.2500 mg | INTRAMUSCULAR | Status: DC | PRN
  Administered 2021-07-22 (×4): 0.5 mg via INTRAVENOUS

## 2021-07-22 MED ORDER — ONDANSETRON HCL 4 MG/2ML IJ SOLN: 4.0000 mg | Freq: Once | INTRAMUSCULAR | Status: DC | PRN

## 2021-07-22 MED ORDER — CEFAZOLIN SODIUM-DEXTROSE 2-4 GM/100ML-% IV SOLN: 2.0000 g | Freq: Three times a day (TID) | INTRAVENOUS | Status: DC

## 2021-07-22 MED ORDER — POLYETHYLENE GLYCOL 3350 17 G PO PACK
17.0000 g | PACK | Freq: Every day | ORAL | Status: DC | PRN
  Filled 2021-07-22: qty 1

## 2021-07-22 MED ORDER — OXYCODONE HCL 5 MG PO TABS
5.0000 mg | ORAL_TABLET | ORAL | Status: DC | PRN
  Administered 2021-07-23 – 2021-07-26 (×5): 10 mg via ORAL
  Filled 2021-07-22 (×3): qty 2

## 2021-07-22 MED ORDER — HYDROXYCHLOROQUINE SULFATE 200 MG PO TABS
200.0000 mg | ORAL_TABLET | Freq: Two times a day (BID) | ORAL | Status: DC
  Administered 2021-07-22 – 2021-07-29 (×14): 200 mg via ORAL
  Filled 2021-07-22 (×15): qty 1

## 2021-07-22 MED ORDER — FENTANYL CITRATE (PF) 250 MCG/5ML IJ SOLN
INTRAMUSCULAR | Status: DC | PRN
  Administered 2021-07-22: 100 ug via INTRAVENOUS
  Administered 2021-07-22 (×3): 50 ug via INTRAVENOUS

## 2021-07-22 MED ORDER — CEFAZOLIN SODIUM-DEXTROSE 2-4 GM/100ML-% IV SOLN
INTRAVENOUS | Status: AC
  Filled 2021-07-22: qty 100

## 2021-07-22 MED ORDER — ACETAMINOPHEN 325 MG PO TABS: 325.0000 mg | ORAL_TABLET | Freq: Four times a day (QID) | ORAL | Status: DC | PRN

## 2021-07-22 MED ORDER — VITAMIN B-12 1000 MCG PO TABS
1000.0000 ug | ORAL_TABLET | Freq: Every morning | ORAL | Status: DC
  Administered 2021-07-23 – 2021-07-29 (×7): 1000 ug via ORAL
  Filled 2021-07-22 (×9): qty 1

## 2021-07-22 MED ORDER — OXYCODONE HCL 5 MG/5ML PO SOLN: 5.0000 mg | Freq: Once | ORAL | Status: DC | PRN

## 2021-07-22 MED ORDER — ONDANSETRON HCL 4 MG/2ML IJ SOLN
INTRAMUSCULAR | Status: DC | PRN
  Administered 2021-07-22: 4 mg via INTRAVENOUS

## 2021-07-22 MED ORDER — OXYCODONE HCL 5 MG PO TABS: 5.0000 mg | ORAL_TABLET | Freq: Once | ORAL | Status: DC | PRN

## 2021-07-22 MED ORDER — GABAPENTIN 400 MG PO CAPS: 400.0000 mg | ORAL_CAPSULE | Freq: Every day | ORAL | Status: DC

## 2021-07-22 MED ORDER — MAGNESIUM SULFATE 2 GM/50ML IV SOLN
2.0000 g | Freq: Every day | INTRAVENOUS | Status: DC | PRN
  Filled 2021-07-22: qty 50

## 2021-07-22 MED ORDER — HYDROMORPHONE HCL 1 MG/ML IJ SOLN
0.2500 mg | INTRAMUSCULAR | Status: DC | PRN
  Administered 2021-07-22 (×2): 0.25 mg via INTRAVENOUS
  Administered 2021-07-22: 0.5 mg via INTRAVENOUS

## 2021-07-22 MED ORDER — HYDROMORPHONE HCL 1 MG/ML IJ SOLN
0.5000 mg | INTRAMUSCULAR | Status: DC | PRN
  Administered 2021-07-22 – 2021-07-24 (×5): 1 mg via INTRAVENOUS
  Administered 2021-07-24: 0.5 mg via INTRAVENOUS
  Administered 2021-07-24 – 2021-07-25 (×2): 1 mg via INTRAVENOUS
  Administered 2021-07-25: 0.5 mg via INTRAVENOUS
  Administered 2021-07-25 – 2021-07-29 (×9): 1 mg via INTRAVENOUS
  Filled 2021-07-22 (×17): qty 1

## 2021-07-22 MED ORDER — CHLORHEXIDINE GLUCONATE 0.12 % MT SOLN: 15.0000 mL | Freq: Once | OROMUCOSAL | Status: AC

## 2021-07-22 MED ORDER — MAGNESIUM CITRATE PO SOLN: 1.0000 | Freq: Once | ORAL | Status: DC | PRN

## 2021-07-22 MED ORDER — ZINC SULFATE 220 (50 ZN) MG PO CAPS
220.0000 mg | ORAL_CAPSULE | Freq: Every day | ORAL | Status: DC
  Administered 2021-07-22 – 2021-07-29 (×8): 220 mg via ORAL
  Filled 2021-07-22 (×9): qty 1

## 2021-07-22 MED ORDER — METOPROLOL TARTRATE 5 MG/5ML IV SOLN: 2.0000 mg | INTRAVENOUS | Status: DC | PRN

## 2021-07-22 MED ORDER — ZOLPIDEM TARTRATE 5 MG PO TABS: 5.0000 mg | ORAL_TABLET | Freq: Every evening | ORAL | Status: DC | PRN

## 2021-07-22 MED ORDER — INSULIN ASPART 100 UNIT/ML IJ SOLN
6.0000 [IU] | Freq: Three times a day (TID) | INTRAMUSCULAR | Status: DC
  Administered 2021-07-23 – 2021-07-29 (×17): 6 [IU] via SUBCUTANEOUS

## 2021-07-22 MED ORDER — ROCURONIUM BROMIDE 10 MG/ML (PF) SYRINGE
PREFILLED_SYRINGE | INTRAVENOUS | Status: DC | PRN
  Administered 2021-07-22: 30 mg via INTRAVENOUS

## 2021-07-22 MED ORDER — ALBUTEROL SULFATE (2.5 MG/3ML) 0.083% IN NEBU
2.5000 mg | INHALATION_SOLUTION | RESPIRATORY_TRACT | Status: DC | PRN
  Administered 2021-07-26 – 2021-07-28 (×2): 2.5 mg via RESPIRATORY_TRACT
  Filled 2021-07-22 (×2): qty 3

## 2021-07-22 MED ORDER — FUROSEMIDE 20 MG PO TABS
20.0000 mg | ORAL_TABLET | Freq: Three times a day (TID) | ORAL | Status: DC | PRN
Start: 2021-07-22 — End: 2021-07-29

## 2021-07-22 MED ORDER — GABAPENTIN 400 MG PO CAPS: 400.0000 mg | ORAL_CAPSULE | Freq: Three times a day (TID) | ORAL | Status: DC

## 2021-07-22 MED ORDER — SUCCINYLCHOLINE CHLORIDE 200 MG/10ML IV SOSY
PREFILLED_SYRINGE | INTRAVENOUS | Status: DC | PRN
  Administered 2021-07-22: 160 mg via INTRAVENOUS

## 2021-07-22 MED ORDER — JUVEN PO PACK
1.0000 | PACK | Freq: Two times a day (BID) | ORAL | Status: DC
  Administered 2021-07-25 – 2021-07-29 (×8): 1 via ORAL
  Filled 2021-07-22 (×10): qty 1

## 2021-07-22 MED ORDER — OXYCODONE HCL 5 MG PO TABS
10.0000 mg | ORAL_TABLET | ORAL | Status: DC | PRN
  Administered 2021-07-22 – 2021-07-24 (×6): 15 mg via ORAL
  Administered 2021-07-24 – 2021-07-25 (×2): 10 mg via ORAL
  Administered 2021-07-25 – 2021-07-26 (×2): 15 mg via ORAL
  Administered 2021-07-26: 10 mg via ORAL
  Administered 2021-07-27 – 2021-07-28 (×7): 15 mg via ORAL
  Administered 2021-07-29 (×2): 10 mg via ORAL
  Filled 2021-07-22 (×2): qty 3
  Filled 2021-07-22: qty 2
  Filled 2021-07-22 (×2): qty 3
  Filled 2021-07-22: qty 2
  Filled 2021-07-22 (×2): qty 3
  Filled 2021-07-22: qty 2
  Filled 2021-07-22 (×5): qty 3
  Filled 2021-07-22: qty 2
  Filled 2021-07-22 (×3): qty 3
  Filled 2021-07-22: qty 2
  Filled 2021-07-22: qty 3
  Filled 2021-07-22: qty 2

## 2021-07-22 MED ORDER — HYDRALAZINE HCL 20 MG/ML IJ SOLN: 5.0000 mg | INTRAMUSCULAR | Status: DC | PRN

## 2021-07-22 MED ORDER — DEXTROSE 5 % IV SOLN
INTRAVENOUS | Status: DC | PRN
  Administered 2021-07-22: 3 g via INTRAVENOUS

## 2021-07-22 MED ORDER — VANCOMYCIN HCL 2000 MG/400ML IV SOLN
2000.0000 mg | Freq: Once | INTRAVENOUS | Status: AC
  Administered 2021-07-22: 2000 mg via INTRAVENOUS
  Filled 2021-07-22: qty 400

## 2021-07-22 MED ORDER — VANCOMYCIN HCL IN DEXTROSE 1-5 GM/200ML-% IV SOLN
INTRAVENOUS | Status: AC
  Filled 2021-07-22: qty 200

## 2021-07-22 MED ORDER — MUPIROCIN 2 % EX OINT
TOPICAL_OINTMENT | CUTANEOUS | Status: AC
  Filled 2021-07-22: qty 22

## 2021-07-22 MED ORDER — DIPHENHYDRAMINE HCL 12.5 MG/5ML PO ELIX: 12.5000 mg | ORAL_SOLUTION | ORAL | Status: DC | PRN

## 2021-07-22 MED ORDER — LORATADINE 10 MG PO TABS
10.0000 mg | ORAL_TABLET | Freq: Every evening | ORAL | Status: DC
  Administered 2021-07-22 – 2021-07-28 (×7): 10 mg via ORAL
  Filled 2021-07-22 (×7): qty 1

## 2021-07-22 MED ORDER — GABAPENTIN 400 MG PO CAPS
800.0000 mg | ORAL_CAPSULE | Freq: Every day | ORAL | Status: DC
  Administered 2021-07-22 – 2021-07-28 (×7): 800 mg via ORAL
  Filled 2021-07-22 (×7): qty 2

## 2021-07-22 MED ORDER — LIDOCAINE 2% (20 MG/ML) 5 ML SYRINGE
INTRAMUSCULAR | Status: DC | PRN
  Administered 2021-07-22: 60 mg via INTRAVENOUS

## 2021-07-22 MED ORDER — FENTANYL CITRATE (PF) 100 MCG/2ML IJ SOLN: 50.0000 ug | Freq: Once | INTRAMUSCULAR | Status: AC

## 2021-07-22 MED ORDER — ACETAMINOPHEN 10 MG/ML IV SOLN
INTRAVENOUS | Status: AC
  Filled 2021-07-22: qty 100

## 2021-07-22 MED ORDER — ALBUTEROL SULFATE HFA 108 (90 BASE) MCG/ACT IN AERS: 2.0000 | INHALATION_SPRAY | RESPIRATORY_TRACT | Status: DC | PRN

## 2021-07-22 MED ORDER — ROCURONIUM BROMIDE 10 MG/ML (PF) SYRINGE
PREFILLED_SYRINGE | INTRAVENOUS | Status: AC
  Filled 2021-07-22: qty 10

## 2021-07-22 MED ORDER — LIDOCAINE 2% (20 MG/ML) 5 ML SYRINGE
INTRAMUSCULAR | Status: AC
  Filled 2021-07-22: qty 5

## 2021-07-22 MED ORDER — ALUM & MAG HYDROXIDE-SIMETH 200-200-20 MG/5ML PO SUSP: 15.0000 mL | ORAL | Status: DC | PRN

## 2021-07-22 MED ORDER — KETAMINE HCL 50 MG/5ML IJ SOSY
PREFILLED_SYRINGE | INTRAMUSCULAR | Status: AC
  Filled 2021-07-22: qty 5

## 2021-07-22 MED ORDER — POLYETHYL GLYCOL-PROPYL GLYCOL 0.4-0.3 % OP SOLN: 1.0000 [drp] | Freq: Every morning | OPHTHALMIC | Status: DC

## 2021-07-22 MED ORDER — CEFAZOLIN IN SODIUM CHLORIDE 3-0.9 GM/100ML-% IV SOLN
3.0000 g | INTRAVENOUS | Status: DC
  Filled 2021-07-22: qty 100

## 2021-07-22 MED ORDER — ORAL CARE MOUTH RINSE: 15.0000 mL | Freq: Once | OROMUCOSAL | Status: AC

## 2021-07-22 MED ORDER — GABAPENTIN 400 MG PO CAPS
400.0000 mg | ORAL_CAPSULE | Freq: Two times a day (BID) | ORAL | Status: DC
  Administered 2021-07-23 – 2021-07-29 (×13): 400 mg via ORAL
  Filled 2021-07-22 (×13): qty 1

## 2021-07-22 MED ORDER — AMISULPRIDE (ANTIEMETIC) 5 MG/2ML IV SOLN: 10.0000 mg | Freq: Once | INTRAVENOUS | Status: DC | PRN

## 2021-07-22 MED ORDER — FENTANYL CITRATE (PF) 100 MCG/2ML IJ SOLN
INTRAMUSCULAR | Status: AC
  Administered 2021-07-22: 50 ug via INTRAVENOUS
  Filled 2021-07-22: qty 2

## 2021-07-22 MED ORDER — DILTIAZEM HCL ER COATED BEADS 240 MG PO CP24
240.0000 mg | ORAL_CAPSULE | Freq: Every day | ORAL | Status: DC
  Administered 2021-07-22 – 2021-07-29 (×8): 240 mg via ORAL
  Filled 2021-07-22: qty 2
  Filled 2021-07-22: qty 1
  Filled 2021-07-22: qty 2
  Filled 2021-07-22: qty 1
  Filled 2021-07-22: qty 2
  Filled 2021-07-22 (×2): qty 1
  Filled 2021-07-22: qty 2
  Filled 2021-07-22: qty 1
  Filled 2021-07-22: qty 2
  Filled 2021-07-22 (×3): qty 1
  Filled 2021-07-22 (×2): qty 2

## 2021-07-22 MED ORDER — DEXMEDETOMIDINE BOLUS VIA INFUSION: 0.1500 ug/kg | Freq: Once | INTRAVENOUS | Status: DC

## 2021-07-22 MED ORDER — LACTATED RINGERS IV SOLN: INTRAVENOUS | Status: DC | PRN

## 2021-07-22 SURGICAL SUPPLY — 38 items
BAG COUNTER SPONGE SURGICOUNT (BAG) IMPLANT
BLADE SAW RECIP 87.9 MT (BLADE) ×2 IMPLANT
BLADE SURG 21 STRL SS (BLADE) ×2 IMPLANT
BNDG COHESIVE 6X5 TAN STRL LF (GAUZE/BANDAGES/DRESSINGS) ×2 IMPLANT
CANISTER WOUND CARE 500ML ATS (WOUND CARE) IMPLANT
COVER SURGICAL LIGHT HANDLE (MISCELLANEOUS) ×2 IMPLANT
CUFF TOURN SGL QUICK 34 (TOURNIQUET CUFF)
CUFF TRNQT CYL 34X4.125X (TOURNIQUET CUFF) IMPLANT
DRAPE INCISE IOBAN 66X45 STRL (DRAPES) ×4 IMPLANT
DRAPE U-SHAPE 47X51 STRL (DRAPES) ×2 IMPLANT
DRESSING PREVENA PLUS CUSTOM (GAUZE/BANDAGES/DRESSINGS) ×1 IMPLANT
DRSG PREVENA PLUS CUSTOM (GAUZE/BANDAGES/DRESSINGS) ×2
DURAPREP 26ML APPLICATOR (WOUND CARE) ×2 IMPLANT
ELECT REM PT RETURN 9FT ADLT (ELECTROSURGICAL) ×2
ELECTRODE REM PT RTRN 9FT ADLT (ELECTROSURGICAL) ×1 IMPLANT
GLOVE SURG ORTHO LTX SZ9 (GLOVE) ×2 IMPLANT
GLOVE SURG POLYISO LF SZ6.5 (GLOVE) ×2 IMPLANT
GLOVE SURG UNDER POLY LF SZ7.5 (GLOVE) ×2 IMPLANT
GLOVE SURG UNDER POLY LF SZ9 (GLOVE) ×2 IMPLANT
GOWN STRL REUS W/ TWL LRG LVL3 (GOWN DISPOSABLE) ×1 IMPLANT
GOWN STRL REUS W/ TWL XL LVL3 (GOWN DISPOSABLE) ×2 IMPLANT
GOWN STRL REUS W/TWL LRG LVL3 (GOWN DISPOSABLE) ×2
GOWN STRL REUS W/TWL XL LVL3 (GOWN DISPOSABLE) ×4
KIT BASIN OR (CUSTOM PROCEDURE TRAY) ×2 IMPLANT
KIT TURNOVER KIT B (KITS) ×2 IMPLANT
MANIFOLD NEPTUNE II (INSTRUMENTS) ×2 IMPLANT
NS IRRIG 1000ML POUR BTL (IV SOLUTION) ×2 IMPLANT
PACK ORTHO EXTREMITY (CUSTOM PROCEDURE TRAY) ×2 IMPLANT
PAD ARMBOARD 7.5X6 YLW CONV (MISCELLANEOUS) ×2 IMPLANT
PREVENA RESTOR ARTHOFORM 46X30 (CANNISTER) ×2 IMPLANT
STAPLER VISISTAT 35W (STAPLE) IMPLANT
STOCKINETTE IMPERVIOUS LG (DRAPES) IMPLANT
SUT ETHILON 2 0 PSLX (SUTURE) ×4 IMPLANT
SUT SILK 2 0 (SUTURE) ×2
SUT SILK 2-0 18XBRD TIE 12 (SUTURE) ×1 IMPLANT
TOWEL GREEN STERILE FF (TOWEL DISPOSABLE) ×2 IMPLANT
TUBE CONNECTING 20X1/4 (TUBING) ×2 IMPLANT
YANKAUER SUCT BULB TIP NO VENT (SUCTIONS) ×2 IMPLANT

## 2021-07-22 NOTE — Anesthesia Procedure Notes (Signed)
Procedure Name: Intubation Date/Time: 07/22/2021 3:03 PM Performed by: Lendon Ka, CRNA Pre-anesthesia Checklist: Patient identified, Emergency Drugs available, Suction available and Patient being monitored Patient Re-evaluated:Patient Re-evaluated prior to induction Oxygen Delivery Method: Circle System Utilized Preoxygenation: Pre-oxygenation with 100% oxygen Induction Type: IV induction Ventilation: Mask ventilation without difficulty Laryngoscope Size: Glidescope and 3 Grade View: Grade I Tube type: Oral Number of attempts: 1 Airway Equipment and Method: Stylet Placement Confirmation: ETT inserted through vocal cords under direct vision, positive ETCO2 and breath sounds checked- equal and bilateral Secured at: 21 cm Tube secured with: Tape Dental Injury: Teeth and Oropharynx as per pre-operative assessment

## 2021-07-22 NOTE — Progress Notes (Signed)
Pharmacy Antibiotic Note  [name] is a 60 y.o. female admitted on 07/22/2021 with wound infection s/p AKA for progressive wound dehiscence.  Pharmacy has been consulted for vancomycin dosing.  Plan: Vancomycin 2000mg  IV x 1 Vancomycin 1000mg  IV q24h Estimated AUC 464 (goal 400-550) Scr used 1.39   Height: 5\' 2"  (157.5 cm) Weight: 132 kg (291 lb) IBW/kg (Calculated) : 50.1  Temp (24hrs), Avg:98.9 F (37.2 C), Min:98.9 F (37.2 C), Max:98.9 F (37.2 C)  No results for input(s): WBC, CREATININE, LATICACIDVEN, VANCOTROUGH, VANCOPEAK, VANCORANDOM, GENTTROUGH, GENTPEAK, GENTRANDOM, TOBRATROUGH, TOBRAPEAK, TOBRARND, AMIKACINPEAK, AMIKACINTROU, AMIKACIN in the last 168 hours.  Estimated Creatinine Clearance: 56.3 mL/min (A) (by C-G formula based on SCr of 1.39 mg/dL (H)).    Allergies  Allergen Reactions   Fish Allergy Anaphylaxis    INCLUDES OMEGA 3 OILS   Fish Oil Anaphylaxis and Swelling    THROAT SWELLS INCLUDES FISH AS A CLASS   Omega-3 Fatty Acids Swelling    Throat swelling  Has tolerated Glucerna nutritional drinks    Crestor [Rosuvastatin] Other (See Comments)    Extreme joint pain, to this & other statins   Gabapentin Anxiety    Anxiety on high doses   Lovastatin Other (See Comments)    EXTREME JOINT PAIN   Metronidazole Nausea Only and Swelling    Headache and shakes Nausea and vomiting   Red Yeast Rice [Cholestin] Other (See Comments)    Muscle pain and severe joint pain.    Rotigotine Swelling    (Neupro) Extreme edema    Atrovent Hfa [Ipratropium Bromide Hfa] Other (See Comments)    wheezing   Iodine Swelling   Other Other (See Comments)    Surgical Staples causes redness/infection per patient.   Pepto-Bismol [Bismuth Subsalicylate] Nausea And Vomiting    Extreme vomiting   Red Yeast Rice Extract     Joint pain   Victoza [Liraglutide]     Unsure of reaction    Enalapril Cough   Suprep [Na Sulfate-K Sulfate-Mg Sulf] Nausea And Vomiting   Tape  Other (See Comments)    Electrodes causes skin breakdown    Antimicrobials this admission: 11/11 vancomycin >>   Dose adjustments this admission: N/a    Nabria Nevin A. , PharmD, BCPS, FNKF Clinical Pharmacist [place] Please utilize Amion for appropriate phone number to reach the unit pharmacist [facility])  07/22/2021 4:18 PM

## 2021-07-22 NOTE — Op Note (Signed)
07/22/2021  4:02 PM  PATIENT:  [name]    PRE-OPERATIVE DIAGNOSIS:  Dehiscence Right Below Knee Amputation  POST-OPERATIVE DIAGNOSIS:  Same  PROCEDURE:  RIGHT ABOVE KNEE AMPUTATION  SURGEON:  [name], MD  PHYSICIAN ASSISTANT:None ANESTHESIA:   General  PREOPERATIVE INDICATIONS:  [name] is a  60 y.o. female with a diagnosis of Dehiscence Right Below Knee Amputation who failed conservative measures and elected for surgical management.    The risks benefits and alternatives were discussed with the patient preoperatively including but not limited to the risks of infection, bleeding, nerve injury, cardiopulmonary complications, the need for revision surgery, among others, and the patient was willing to proceed.  OPERATIVE IMPLANTS: Praveena dressing  @ENCIMAGES @  OPERATIVE FINDINGS: Tissue margins clear no signs of infection good petechial bleeding.  OPERATIVE PROCEDURE: Patient was brought the operating room and underwent a general anesthetic.  After adequate levels anesthesia obtained patient's right lower extremity was prepped using DuraPrep draped into a sterile field a timeout was called.  A fishmouth incision was made just proximal to the patella.  This was carried sharply down to bone the vascular bundles medially were clamped and suture ligated with 2-0 silk the amputation was completed the femur was transected with a reciprocating saw.  Hemostasis was further obtained the wound was irrigated normal saline the deep fascial layers were closed using #1 Vicryl the skin was closed using 2-0 nylon a Praveena dressing was applied this had a good suction fit patient was extubated taken the PACU in stable condition.   DISCHARGE PLANNING:  Antibiotic duration: Continue IV antibiotics  Weightbearing: Nonweightbearing on the right  Pain medication: High dose opioid pathway  Dressing care/ Wound VAC: Continue wound VAC for 1 week  Ambulatory devices:  Walker  Discharge to: Discharge planning based on therapy recommendations.  Follow-up: In the office 1 week post operative.

## 2021-07-22 NOTE — Anesthesia Postprocedure Evaluation (Signed)
Anesthesia Post Note  Patient: [name]  Procedure(s) Performed: RIGHT ABOVE KNEE AMPUTATION (Right: Knee)     Patient location during evaluation: PACU Anesthesia Type: General Level of consciousness: awake and alert Pain management: pain level controlled Vital Signs Assessment: post-procedure vital signs reviewed and stable Respiratory status: spontaneous breathing, nonlabored ventilation, respiratory function stable and patient connected to nasal cannula oxygen Cardiovascular status: blood pressure returned to baseline and stable Postop Assessment: no apparent nausea or vomiting Anesthetic complications: no   No notable events documented.  Last Vitals:  Vitals:   07/22/21 1625 07/22/21 1630  BP: (!) 175/58   Pulse: 93 90  Resp: (!) 22 13  Temp:    SpO2: 99% 94%    Last Pain:  Vitals:   07/22/21 1625  TempSrc:   PainSc: 6                  Trevor Iha

## 2021-07-22 NOTE — Transfer of Care (Signed)
Immediate Anesthesia Transfer of Care Note  Patient: [name]  Procedure(s) Performed: RIGHT ABOVE KNEE AMPUTATION (Right: Knee)  Patient Location: PACU  Anesthesia Type:General  Level of Consciousness: awake, alert  and patient cooperative  Airway & Oxygen Therapy: Patient Spontanous Breathing and Patient connected to face mask oxygen  Post-op Assessment: Report given to RN, Post -op Vital signs reviewed and stable and Patient moving all extremities  Post vital signs: Reviewed and stable  Last Vitals:  Vitals Value Taken Time  BP 174/54 07/22/21 1554  Temp    Pulse 86 07/22/21 1555  Resp 14 07/22/21 1555  SpO2 100 % 07/22/21 1555  Vitals shown include unvalidated device data.  Last Pain:  Vitals:   07/22/21 1321  TempSrc:   PainSc: 8          Complications: No notable events documented.

## 2021-07-22 NOTE — H&P (Signed)
[name] is an 60 y.o. female.   Chief Complaint: Dehiscence right transtibial amputation. HPI: Patient is a 60 year old woman who is 4 weeks status post revision right transtibial amputation.  Patient has had close follow-up after surgery with compression wraps due to the increased swelling from her leg being dependent.  Patient presents at this time with progressive wound dehiscence.  Past Medical History:  Diagnosis Date   Aortic stenosis    mild AS by echo 02/2021   Asthma    "on daily RX and rescue inhaler" (04/18/2018)   Chronic diastolic CHF (congestive heart failure) ([place]) 09/2015   Chronic lower back pain    Chronic neck pain    Chronic pain syndrome    Fentanyl Patch   Colon polyps    Coronary artery disease    cath with normal LM, 30% LAD, 85% mid RCA and 95% distal RCA s/p PCI of the mid to distal RCA and now on DAPT with ASA and Ticagrelor.     Eczema    Excessive daytime sleepiness 11/26/2015   Fibromyalgia    Gallstones    GERD (gastroesophageal reflux disease)    Heart murmur    "noted for the 1st time on 04/18/2018"   History of blood transfusion 07/2010   "S/P oophorectomy"   History of gout    History of hiatal hernia 1980s   "gone now" (04/18/2018)   Hyperlipidemia    Hypertension    takes Metoprolol and Enalapril daily   Hypothyroidism    takes Synthroid daily   IBS (irritable bowel syndrome)    Migraine    "nothing in the 2000s" (04/18/2018)   Mixed connective tissue disease ([place])    NAFLD (nonalcoholic fatty liver disease)    Pneumonia    "several times" (04/18/2018)   PVC's (premature ventricular contractions)    noted on event monitor 02/2021   Rheumatoid arthritis ([place])    "hands, elbows, shoulders, probably knees" (04/18/2018)   Scoliosis    Sleep apnea    mild - does not use cpap   Spondylosis    Type II diabetes mellitus ([place])    takes Metformin and Hum R daily (04/18/2018)   Walker as ambulation aid    also uses wheelchair    Past Surgical  History:  Procedure Laterality Date   ABDOMINAL HYSTERECTOMY  06/2005   "w/right ovariy"   AMPUTATION Right 01/28/2020    RIGHT FOURTH AND FIFTH RAY AMPUTATION   AMPUTATION Right 01/28/2020   Procedure: RIGHT FOURTH AND FIFTH RAY AMPUTATION,;  Surgeon: [name] Minion, MD;  Location: [place];  Service: Orthopedics;  Laterality: Right;   AMPUTATION Right 06/01/2021   Procedure: RIGHT BELOW KNEE AMPUTATION;  Surgeon: [name] Minion, MD;  Location: [place];  Service: Orthopedics;  Laterality: Right;   APPENDECTOMY     BREAST BIOPSY Bilateral    9 total (04/18/2018)   CORONARY ANGIOPLASTY WITH STENT PLACEMENT  10/01/2015   normal LM, 30% LAD, 85% mid RCA and 95% distal RCA s/p PCI of the mid to distal RCA and now on DAPT with ASA and Ticagrelor.     CORONARY STENT INTERVENTION Right 04/18/2018   Procedure: CORONARY STENT INTERVENTION;  Surgeon: [name] Blanks, MD;  Location: [place] [facility];  Service: Cardiovascular;  Laterality: Right;   DILATION AND CURETTAGE OF UTERUS     FRACTURE SURGERY     LAPAROSCOPIC CHOLECYSTECTOMY     LEFT HEART CATH AND CORONARY ANGIOGRAPHY N/A 04/18/2018   Procedure: LEFT  HEART CATH AND CORONARY ANGIOGRAPHY;  Surgeon: [name] Hazel, MD;  Location: [facility];  Service: Cardiovascular;  Laterality: N/A;   MUSCLE BIOPSY Left    "leg"   OOPHORECTOMY  07/2010   RADIOLOGY WITH ANESTHESIA N/A 05/25/2016   Procedure: RADIOLOGY WITH ANESTHESIA;  Surgeon: Medication Radiologist, MD;  Location: [facility];  Service: Radiology;  Laterality: N/A;   STUMP REVISION Right 06/29/2021   Procedure: REVISION RIGHT BELOW KNEE AMPUTATION;  Surgeon: [name] Mustard, MD;  Location: [facility] OR;  Service: Orthopedics;  Laterality: Right;   TEE WITHOUT CARDIOVERSION N/A 06/01/2021   Procedure: TRANSESOPHAGEAL ECHOCARDIOGRAM (TEE);  Surgeon: [name] Rives, MD;  Location: [facility];  Service: Cardiovascular;  Laterality: N/A;   TONSILLECTOMY AND ADENOIDECTOMY     WRIST FRACTURE  SURGERY Left    "crushed it"    Family History  Problem Relation Age of Onset   CAD Mother    Hypertension Mother    Heart attack Mother    CAD Father    Heart attack Father    Allergic rhinitis Father    Asthma Father    Hypertension Brother    Hypertension Brother    Pancreatic cancer Paternal Aunt    Breast cancer Paternal Aunt    Lung cancer Paternal Aunt    Asthma Son    Social History:  reports that she quit smoking about 17 years ago. Her smoking use included cigarettes. She started smoking about 36 years ago. She has a 19.00 pack-year smoking history. She has never used smokeless tobacco. She reports current alcohol use. She reports that she does not currently use drugs after having used the following drugs: Marijuana.  Allergies:  Allergies  Allergen Reactions   Fish Allergy Anaphylaxis    INCLUDES OMEGA 3 OILS   Fish Oil Anaphylaxis and Swelling    THROAT SWELLS INCLUDES FISH AS A CLASS   Omega-3 Fatty Acids Swelling    Throat swelling  Has tolerated Glucerna nutritional drinks    Crestor [Rosuvastatin] Other (See Comments)    Extreme joint pain, to this & other statins   Gabapentin Anxiety    Anxiety on high doses   Lovastatin Other (See Comments)    EXTREME JOINT PAIN   Metronidazole Nausea Only and Swelling    Headache and shakes Nausea and vomiting   Red Yeast Rice [Cholestin] Other (See Comments)    Muscle pain and severe joint pain.    Rotigotine Swelling    (Neupro) Extreme edema    Atrovent Hfa [Ipratropium Bromide Hfa] Other (See Comments)    wheezing   Iodine Swelling   Other Other (See Comments)    Surgical Staples causes redness/infection per patient.   Pepto-Bismol [Bismuth Subsalicylate] Nausea And Vomiting    Extreme vomiting   Red Yeast Rice Extract     Joint pain   Victoza [Liraglutide]     Unsure of reaction    Enalapril Cough   Suprep [Na Sulfate-K Sulfate-Mg Sulf] Nausea And Vomiting   Tape Other (See Comments)    Electrodes  causes skin breakdown    No medications prior to admission.    No results found for this or any previous visit (from the past 48 hour(s)). No results found.  Review of Systems  All other systems reviewed and are negative.  Height 5\' 2"  (1.575 m), weight 133.8 kg. Physical Exam  Patient is alert, oriented, no adenopathy, well-dressed, normal affect, normal respiratory effort. Examination there is progressive wound dehiscence of the residual  limb on the right.  There is dermatitis and maceration of the entire posterior flap and there is wound dehiscence with necrosis of the deep muscle skin and soft tissue laterally. Assessment/Plan Assessment: Dehiscence right transtibial amputation.  Plan: With the progressive wound dehiscence I have recommended patient proceed with an above-the-knee amputation.  Patient does not have sufficient residual limb to proceed with a revision of the transtibial amputation.  I have recommended proceeding with above-knee amputation on Friday.   [name] Mustard, MD 07/22/2021, 9:27 AM

## 2021-07-23 ENCOUNTER — Encounter (HOSPITAL_COMMUNITY): Payer: Self-pay | Admitting: Orthopedic Surgery

## 2021-07-23 LAB — BASIC METABOLIC PANEL
Anion gap: 7 (ref 5–15)
BUN: 6 mg/dL (ref 6–20)
CO2: 28 mmol/L (ref 22–32)
Calcium: 8 mg/dL — ABNORMAL LOW (ref 8.9–10.3)
Chloride: 99 mmol/L (ref 98–111)
Creatinine, Ser: 0.81 mg/dL (ref 0.44–1.00)
GFR, Estimated: >60 mL/min (ref >60)
Glucose, Bld: 225 mg/dL — ABNORMAL HIGH (ref 70–99)
Potassium: 4.5 mmol/L (ref 3.5–5.1)
Sodium: 134 mmol/L — ABNORMAL LOW (ref 135–145)

## 2021-07-23 LAB — GLUCOSE, CAPILLARY
Glucose-Capillary: 206 mg/dL — ABNORMAL HIGH (ref 70–99)
Glucose-Capillary: 194 mg/dL — ABNORMAL HIGH (ref 70–99)
Glucose-Capillary: 175 mg/dL — ABNORMAL HIGH (ref 70–99)
Glucose-Capillary: 163 mg/dL — ABNORMAL HIGH (ref 70–99)
Glucose-Capillary: 167 mg/dL — ABNORMAL HIGH (ref 70–99)

## 2021-07-23 LAB — CBC
HCT: 27.5 % — ABNORMAL LOW (ref 36.0–46.0)
Hemoglobin: 7.9 g/dL — ABNORMAL LOW (ref 12.0–15.0)
MCH: 23 pg — ABNORMAL LOW (ref 26.0–34.0)
MCHC: 28.7 g/dL — ABNORMAL LOW (ref 30.0–36.0)
MCV: 80.2 fL (ref 80.0–100.0)
Platelets: 378 10*3/uL (ref 150–400)
RBC: 3.43 MIL/uL — ABNORMAL LOW (ref 3.87–5.11)
RDW: 16.8 % — ABNORMAL HIGH (ref 11.5–15.5)
WBC: 10.8 10*3/uL — ABNORMAL HIGH (ref 4.0–10.5)
nRBC: 0 % (ref 0.0–0.2)

## 2021-07-23 MED ORDER — CHLORHEXIDINE GLUCONATE CLOTH 2 % EX PADS
6.0000 | MEDICATED_PAD | Freq: Every day | CUTANEOUS | Status: DC
  Administered 2021-07-23 – 2021-07-29 (×5): 6 via TOPICAL

## 2021-07-23 MED ORDER — KETOROLAC TROMETHAMINE 15 MG/ML IJ SOLN
15.0000 mg | Freq: Four times a day (QID) | INTRAMUSCULAR | Status: AC
  Administered 2021-07-23 – 2021-07-24 (×5): 15 mg via INTRAVENOUS
  Filled 2021-07-23 (×5): qty 1

## 2021-07-23 MED ORDER — VANCOMYCIN HCL 1750 MG/350ML IV SOLN
1750.0000 mg | INTRAVENOUS | Status: DC
  Administered 2021-07-23: 1750 mg via INTRAVENOUS
  Filled 2021-07-23 (×2): qty 350

## 2021-07-23 MED ORDER — MUPIROCIN 2 % EX OINT
1.0000 "application " | TOPICAL_OINTMENT | Freq: Two times a day (BID) | CUTANEOUS | Status: AC
  Administered 2021-07-23 – 2021-07-28 (×10): 1 via NASAL
  Filled 2021-07-23: qty 22

## 2021-07-23 NOTE — Progress Notes (Signed)
Patient ID: [name], female   DOB: 19-Jul-1961, 60 y.o.   MRN: [id number] Patient is postoperative day 1 right above-the-knee amputation.  Patient states she has been having more pain than previous surgeries.  The wound VAC dressing does not have a good seal there are 2X marks.  There is no drainage in the wound VAC canister.  Hemoglobin 7.9.  White cell count 10.8  Renal function is good.  Will add Toradol.

## 2021-07-23 NOTE — Plan of Care (Signed)
  Problem: Education: Goal: Knowledge of General Education information will improve Description: Including pain rating scale, medication(s)/side effects and non-pharmacologic comfort measures Outcome: Progressing   Problem: Nutrition: Goal: Adequate nutrition will be maintained Outcome: Progressing   Problem: Coping: Goal: Level of anxiety will decrease Outcome: Progressing   

## 2021-07-23 NOTE — Plan of Care (Signed)
  Problem: Education: Goal: Knowledge of General Education information will improve Description: Including pain rating scale, medication(s)/side effects and non-pharmacologic comfort measures Outcome: Progressing   Problem: Nutrition: Goal: Adequate nutrition will be maintained Outcome: Progressing   Problem: Coping: Goal: Level of anxiety will decrease 07/23/2021 0311 by Alonna Buckler, RN Outcome: Progressing 07/23/2021 0307 by Alonna Buckler, RN Outcome: Progressing   Problem: Pain Managment: Goal: General experience of comfort will improve Outcome: Progressing

## 2021-07-23 NOTE — Progress Notes (Signed)
Pharmacy Antibiotic Note  [name] is a 60 y.o. female admitted on 07/22/2021 with wound infection, dehiscence of R transtibial amputation. 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 Pharmacy has been consulted for Vancomycin dosing.    Initial Vancomycin dosing calculated with serum creatinine 1.39, which was from 07/05/21.  Creatinine 0.81 today. Prior values in Sept-Oct 2022 were 0.82-1.12.  Plan: Vancomycin adjusted from 1gm to 1750 mg IV q24h. Goal AUC 400-550. Expected AUC: 501 SCr used: 0.81  Will follow renal function, clinical progress and antibiotic plans. Vancomycin levels at steady state if Vanc expected to continue >5-7 days.  Height: 5\' 2"  (157.5 cm) Weight: 132 kg (291 lb) IBW/kg (Calculated) : 50.1  Temp (24hrs), Avg:98.8 F (37.1 C), Min:97.7 F (36.5 C), Max:99.9 F (37.7 C)  Recent Labs  Lab 07/23/21 0616  WBC 10.8*  CREATININE 0.81    Estimated Creatinine Clearance: 96.7 mL/min (by C-G formula based on SCr of 0.81 mg/dL).    Allergies  Allergen Reactions   Fish Allergy Anaphylaxis    INCLUDES OMEGA 3 OILS   Fish Oil Anaphylaxis and Swelling    THROAT SWELLS INCLUDES FISH AS A CLASS   Omega-3 Fatty Acids Swelling    Throat swelling  Has tolerated Glucerna nutritional drinks    Crestor [Rosuvastatin] Other (See Comments)    Extreme joint pain, to this & other statins   Gabapentin Anxiety    Anxiety on high doses   Lovastatin Other (See Comments)    EXTREME JOINT PAIN   Metronidazole Nausea Only and Swelling    Headache and shakes Nausea and vomiting   Red Yeast Rice [Cholestin] Other (See Comments)    Muscle pain and severe joint pain.    Rotigotine Swelling    (Neupro) Extreme edema    Atrovent Hfa [Ipratropium Bromide Hfa] Other (See Comments)    wheezing   Iodine Swelling   Other Other (See Comments)    Surgical Staples causes redness/infection per patient.   Pepto-Bismol [Bismuth  Subsalicylate] Nausea And Vomiting    Extreme vomiting   Red Yeast Rice Extract     Joint pain   Victoza [Liraglutide]     Unsure of reaction    Enalapril Cough   Suprep [Na Sulfate-K Sulfate-Mg Sulf] Nausea And Vomiting   Tape Other (See Comments)    Electrodes causes skin breakdown    Antimicrobials this admission:  Cefazolin 11/11>>11/12  Vancomycin 11/11 >>  Fluconazole  150 mg PO q72h from PTA >>  Mupirocin nasal BID 11/12 >> (11/16)  Dose adjustments this admission: n/a  Microbiology results:  11/11 surgical PCR: positive  11/11 COVID and flu: negative  Thank you for allowing pharmacy to be a part of this patient's care.  13/11, Dennie Fetters 07/23/2021 3:45 PM

## 2021-07-23 NOTE — Progress Notes (Signed)
Pt not voided so far into shift. Bladder scan with greater than 600 ml of urine noted. Pt, within last 24 hours ,had been in and out cathed x 3. MD made aware. OK by Dr. Lajoyce Corners to place a foley. Foley cath placed. 1000 ml of pale yellow urine obtained. Pt reported relief after.

## 2021-07-23 NOTE — Progress Notes (Signed)
Inpatient Rehab Admissions Coordinator:   CIR consult received. I await therapy evaluations and recommendations in order to make a full assessment of Pt.'s candidacy for CIR.   Megan Salon, MS, CCC-SLP Rehab Admissions Coordinator  [phone] (celll) [phone] (office)

## 2021-07-23 NOTE — Evaluation (Signed)
Physical Therapy Evaluation Patient Details Name: [name] MRN: [id number] DOB: 1961/05/02 Today's Date: 07/23/2021  History of Present Illness  Pt is a 60 y/o female s/p R AKA on 11/11 secondary to complications for R BKA. PMH includes CAD, HTN, CHF, asthma, DM, and fibromyalgia.  Clinical Impression  Pt admitted secondary to problem above with deficits below. Pt requiring max A to roll from side to side. Was able to perform partial sit, but could not tolerate further mobility secondary to pain. Pt reports she was able to perform transfers with minimal assist prior to this admission. Given new deficits, feel pt would benefit from CIR level therapies to ensure safety and independence with mobility prior to return home. Will continue to follow.        Recommendations for follow up therapy are one component of a multi-disciplinary discharge planning process, led by the attending physician.  Recommendations may be updated based on patient status, additional functional criteria and insurance authorization.  Follow Up Recommendations Acute inpatient rehab (3hours/day)    Assistance Recommended at Discharge Frequent or constant Supervision/Assistance  Functional Status Assessment Patient has had a recent decline in their functional status and demonstrates the ability to make significant improvements in function in a reasonable and predictable amount of time.  Equipment Recommendations  None recommended by PT    Recommendations for Other Services OT consult     Precautions / Restrictions Precautions Precautions: Fall Precaution Comments: R AKA Restrictions Weight Bearing Restrictions: Yes RLE Weight Bearing: Non weight bearing      Mobility  Bed Mobility Overal bed mobility: Needs Assistance Bed Mobility: Rolling Rolling: Max assist;+2 for physical assistance         General bed mobility comments: Attempted to sit EOB and got to partial sit, but unable to tolerate further  secondary to pain. Rolled from side to side with max A. Pt yelling out in pain throughout so further mobility deferred.    Transfers                        Ambulation/Gait                  Stairs            Wheelchair Mobility    Modified Rankin (Stroke Patients Only)       Balance                                             Pertinent Vitals/Pain Pain Assessment: 0-10 Pain Score: 10-Worst pain ever Pain Location: RLE Pain Descriptors / Indicators: Grimacing;Guarding;Moaning Pain Intervention(s): Limited activity within patient's tolerance;Monitored during session;Repositioned    Home Living Family/patient expects to be discharged to:: Private residence Living Arrangements: Spouse/significant other Available Help at Discharge: Family;Available 24 hours/day Type of Home: House Home Access: Ramped entrance       Home Layout: Two level;Able to live on main level with bedroom/bathroom Home Equipment: Tub bench;BSC/3in1;Rolling Walker (2 wheels);Rollator (4 wheels);Wheelchair - manual      Prior Function Prior Level of Function : Needs assist       Physical Assist : ADLs (physical);Mobility (physical) Mobility (physical): Transfers ADLs (physical): Bathing;Dressing Mobility Comments: Pt reports she was getting better with transfers to [facility] using RW. ADLs Comments: Occasional assist for ADLs since d/c     Hand Dominance  Extremity/Trunk Assessment   Upper Extremity Assessment Upper Extremity Assessment: Defer to OT evaluation    Lower Extremity Assessment Lower Extremity Assessment: RLE deficits/detail RLE Deficits / Details: s/p R AKA    Cervical / Trunk Assessment Cervical / Trunk Assessment: Kyphotic  Communication   Communication: No difficulties  Cognition Arousal/Alertness: Awake/alert Behavior During Therapy: WFL for tasks assessed/performed Overall Cognitive Status: Within Functional Limits for  tasks assessed                                          General Comments General comments (skin integrity, edema, etc.): Pt's husband present at end of session    Exercises     Assessment/Plan    PT Assessment Patient needs continued PT services  PT Problem List Decreased strength;Decreased activity tolerance;Obesity;Decreased knowledge of use of DME;Decreased knowledge of precautions;Decreased balance;Decreased mobility;Decreased coordination;Decreased safety awareness;Pain;Decreased skin integrity       PT Treatment Interventions DME instruction;Gait training;Functional mobility training;Therapeutic activities;Therapeutic exercise;Balance training;Neuromuscular re-education;Patient/family education    PT Goals (Current goals can be found in the Care Plan section)  Acute Rehab PT Goals Patient Stated Goal: to decrease pain PT Goal Formulation: With patient Time For Goal Achievement: 08/06/21 Potential to Achieve Goals: Good    Frequency Min 3X/week   Barriers to discharge        Co-evaluation               AM-PAC PT "6 Clicks" Mobility  Outcome Measure Help needed turning from your back to your side while in a flat bed without using bedrails?: A Lot Help needed moving from lying on your back to sitting on the side of a flat bed without using bedrails?: A Lot Help needed moving to and from a bed to a chair (including a wheelchair)?: Total Help needed standing up from a chair using your arms (e.g., wheelchair or bedside chair)?: Total Help needed to walk in hospital room?: Total Help needed climbing 3-5 steps with a railing? : Total 6 Click Score: 8    End of Session   Activity Tolerance: Patient limited by pain Patient left: in bed;with call bell/phone within reach;with nursing/sitter in room;with bed alarm set Nurse Communication: Mobility status PT Visit Diagnosis: Unsteadiness on feet (R26.81);Other abnormalities of gait and mobility  (R26.89);Muscle weakness (generalized) (M62.81) Pain - Right/Left: Right Pain - part of body: Leg    Time: 7408-1448 PT Time Calculation (min) (ACUTE ONLY): 32 min   Charges:   PT Evaluation $PT Eval Moderate Complexity: 1 Mod PT Treatments $Therapeutic Activity: 8-22 mins        Cindee Salt, DPT  Acute Rehabilitation Services  Pager: [phone] Office: [phone]   Lehman Prom 07/23/2021, 10:36 AM

## 2021-07-24 LAB — CBC
HCT: 26.8 % — ABNORMAL LOW (ref 36.0–46.0)
Hemoglobin: 7.6 g/dL — ABNORMAL LOW (ref 12.0–15.0)
MCH: 23.2 pg — ABNORMAL LOW (ref 26.0–34.0)
MCHC: 28.4 g/dL — ABNORMAL LOW (ref 30.0–36.0)
MCV: 82 fL (ref 80.0–100.0)
Platelets: 330 10*3/uL (ref 150–400)
RBC: 3.27 MIL/uL — ABNORMAL LOW (ref 3.87–5.11)
RDW: 17.2 % — ABNORMAL HIGH (ref 11.5–15.5)
WBC: 13.5 10*3/uL — ABNORMAL HIGH (ref 4.0–10.5)
nRBC: 0 % (ref 0.0–0.2)

## 2021-07-24 LAB — BASIC METABOLIC PANEL
Anion gap: 10 (ref 5–15)
BUN: 13 mg/dL (ref 6–20)
CO2: 25 mmol/L (ref 22–32)
Calcium: 7.4 mg/dL — ABNORMAL LOW (ref 8.9–10.3)
Chloride: 98 mmol/L (ref 98–111)
Creatinine, Ser: 1.28 mg/dL — ABNORMAL HIGH (ref 0.44–1.00)
GFR, Estimated: 48 mL/min — ABNORMAL LOW (ref >60)
Glucose, Bld: 197 mg/dL — ABNORMAL HIGH (ref 70–99)
Potassium: 4.2 mmol/L (ref 3.5–5.1)
Sodium: 133 mmol/L — ABNORMAL LOW (ref 135–145)

## 2021-07-24 LAB — GLUCOSE, CAPILLARY
Glucose-Capillary: 235 mg/dL — ABNORMAL HIGH (ref 70–99)
Glucose-Capillary: 233 mg/dL — ABNORMAL HIGH (ref 70–99)
Glucose-Capillary: 220 mg/dL — ABNORMAL HIGH (ref 70–99)
Glucose-Capillary: 174 mg/dL — ABNORMAL HIGH (ref 70–99)

## 2021-07-24 MED ORDER — VANCOMYCIN HCL 1000 MG/200ML IV SOLN
1000.0000 mg | INTRAVENOUS | Status: DC
  Administered 2021-07-24 – 2021-07-26 (×3): 1000 mg via INTRAVENOUS
  Filled 2021-07-24 (×3): qty 200

## 2021-07-24 NOTE — Progress Notes (Signed)
Pharmacy Antibiotic Note  [name] is a 60 y.o. female admitted on 07/22/2021 with wound infection, dehiscence of R transtibial amputation. 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 Pharmacy has been consulted for Vancomycin dosing.    Initial Vancomycin dosing calculated with serum creatinine 1.39, which was from 07/05/21.  Creatinine 0.81 on 11/12 and regimen adjusted. Prior values in Sept-Oct 2022 were 0.82-1.12.  Creatinine up to 1.28 today.  Toradol 15 mg IV q6h x 5 doses now completed.  On NS at 125 ml/hr.  Plan: Vancomycin adjusted back to 1gm IV q24h Goal AUC 400-550. Expected AUC: 341 SCr used: 1.28 Will follow renal function, clinical progress and antibiotic plans. Vancomycin levels at steady state if Vanc expected to continue >5-7 days.  Height: 5\' 2"  (157.5 cm) Weight: 132 kg (291 lb) IBW/kg (Calculated) : 50.1  Temp (24hrs), Avg:99.1 F (37.3 C), Min:98.5 F (36.9 C), Max:99.6 F (37.6 C)  Recent Labs  Lab 07/23/21 0616 07/24/21 0213  WBC 10.8* 13.5*  CREATININE 0.81 1.28*     Estimated Creatinine Clearance: 61.2 mL/min (A) (by C-G formula based on SCr of 1.28 mg/dL (H)).    Allergies  Allergen Reactions   Fish Allergy Anaphylaxis    INCLUDES OMEGA 3 OILS   Fish Oil Anaphylaxis and Swelling    THROAT SWELLS INCLUDES FISH AS A CLASS   Omega-3 Fatty Acids Swelling    Throat swelling  Has tolerated Glucerna nutritional drinks    Crestor [Rosuvastatin] Other (See Comments)    Extreme joint pain, to this & other statins   Gabapentin Anxiety    Anxiety on high doses   Lovastatin Other (See Comments)    EXTREME JOINT PAIN   Metronidazole Nausea Only and Swelling    Headache and shakes Nausea and vomiting   Red Yeast Rice [Cholestin] Other (See Comments)    Muscle pain and severe joint pain.    Rotigotine Swelling    (Neupro) Extreme edema    Atrovent Hfa [Ipratropium Bromide Hfa] Other (See Comments)     wheezing   Iodine Swelling   Other Other (See Comments)    Surgical Staples causes redness/infection per patient.   Pepto-Bismol [Bismuth Subsalicylate] Nausea And Vomiting    Extreme vomiting   Red Yeast Rice Extract     Joint pain   Victoza [Liraglutide]     Unsure of reaction    Enalapril Cough   Suprep [Na Sulfate-K Sulfate-Mg Sulf] Nausea And Vomiting   Tape Other (See Comments)    Electrodes causes skin breakdown    Antimicrobials this admission:  Cefazolin 11/11>>11/12  Vancomycin 11/11 >>  Fluconazole  150 mg PO q72h from PTA >>  Mupirocin nasal BID 11/12 >> (11/16)  Dose adjustments this admission:  11/12: Scr 1.39 > 0.81. Empiric change from 1gm to 1750 mg IV q24h  11/13: Scr 1.28: back to 1gm IV q24h  Microbiology results:  11/11 surgical PCR: positive  11/11 COVID and flu: negative  Thank you for allowing pharmacy to be a part of this patient's care.  13/11, Dennie Fetters 07/24/2021 3:23 PM

## 2021-07-24 NOTE — Plan of Care (Signed)

## 2021-07-24 NOTE — Evaluation (Signed)
Occupational Therapy Evaluation Patient Details Name: [name] MRN: [id number] DOB: [birth year] Today's Date: 07/24/2021   History of Present Illness Pt is a 60 y/o female s/p R AKA on 11/11 secondary to complications for R BKA. PMH includes CAD, HTN, CHF, asthma, DM, and fibromyalgia.   Clinical Impression   Pt admitted for procedure listed above. PTA Pt reported that she was independent with all ADL's and functional mobility, using a RW, WC, and drop arm BSC. At this time, pt is requiring increased assist with bed mobility and is unable to stand due to increased pain in RLE. Pt is very motivated and wants to be able to become more independent. Pt left sitting EOB to work on sitting tolerance, RN aware. Recommend Acute Inpatient Rehab to maximize pt's return to independence and reduce caregiver burden. OT will follow acutely to address concerns listed below.      Recommendations for follow up therapy are one component of a multi-disciplinary discharge planning process, led by the attending physician.  Recommendations may be updated based on patient status, additional functional criteria and insurance authorization.   Follow Up Recommendations  Acute inpatient rehab (3hours/day)    Assistance Recommended at Discharge Frequent or constant Supervision/Assistance  Functional Status Assessment  Patient has had a recent decline in their functional status and demonstrates the ability to make significant improvements in function in a reasonable and predictable amount of time.  Equipment Recommendations  None recommended by OT    Recommendations for Other Services Rehab consult     Precautions / Restrictions Precautions Precautions: Fall Precaution Comments: R AKA Restrictions Weight Bearing Restrictions: Yes RLE Weight Bearing: Non weight bearing      Mobility Bed Mobility Overal bed mobility: Needs Assistance Bed Mobility: Supine to Sit     Supine to sit: Min assist;HOB  elevated     General bed mobility comments: Pt able to pivot around and push up to sitting with no assist, needed min A to bring R hip forward and square hips off at EOB for balance.    Transfers Overall transfer level: Needs assistance Equipment used: Rolling walker (2 wheels) Transfers: Sit to/from Stand Sit to Stand: Total assist;+2 physical assistance;+2 safety/equipment           General transfer comment: Pt unable to lift her bottom from the bed at this time due to increased weakness and pain in RLE when it touches any surface.      Balance Overall balance assessment: Needs assistance Sitting-balance support: No upper extremity supported Sitting balance-[name]: Good                                     ADL either performed or assessed with clinical judgement   ADL Overall ADL's : Needs assistance/impaired Eating/Feeding: Set up;Sitting   Grooming: Set up;Sitting   Upper Body Bathing: Minimal assistance;Sitting   Lower Body Bathing: Maximal assistance;+2 for physical assistance;+2 for safety/equipment;Bed level   Upper Body Dressing : Set up;Sitting   Lower Body Dressing: Maximal assistance;+2 for physical assistance;+2 for safety/equipment;Bed level   Toilet Transfer: Total assistance;+2 for physical assistance;+2 for safety/equipment;Requires drop arm   Toileting- Clothing Manipulation and Hygiene: Maximal assistance;+2 for physical assistance;+2 for safety/equipment;Bed level         General ADL Comments: Pt requiring increased assist for LB ADL's and mobility due to increased pain in RLE and weakness.     Vision Baseline  Vision/History: 1 Wears glasses Ability to See in Adequate Light: 0 Adequate Patient Visual Report: No change from baseline Vision Assessment?: No apparent visual deficits     Perception     Praxis      Pertinent Vitals/Pain Pain Assessment: Faces Faces Pain Scale: Hurts whole lot Pain Location: RLE Pain  Descriptors / Indicators: Grimacing;Guarding;Moaning Pain Intervention(s): Limited activity within patient's tolerance;Monitored during session;Repositioned;Patient requesting pain meds-RN notified     Hand Dominance Right   Extremity/Trunk Assessment Upper Extremity Assessment Upper Extremity Assessment: Overall WFL for tasks assessed   Lower Extremity Assessment Lower Extremity Assessment: Defer to PT evaluation   Cervical / Trunk Assessment Cervical / Trunk Assessment: Kyphotic   Communication Communication Communication: No difficulties   Cognition Arousal/Alertness: Awake/alert Behavior During Therapy: WFL for tasks assessed/performed Overall Cognitive Status: Within Functional Limits for tasks assessed                                       General Comments  VSS on RA, Bleeding ulcer noted on L inner thigh    Exercises     Shoulder Instructions      Home Living Family/patient expects to be discharged to:: Private residence Living Arrangements: Spouse/significant other Available Help at Discharge: Family;Available 24 hours/day Type of Home: House Home Access: Ramped entrance     Home Layout: Two level;Able to live on main level with bedroom/bathroom Alternate Level Stairs-Number of Steps: flight Alternate Level Stairs-Rails: Left Bathroom Shower/Tub: Tub/shower unit   Bathroom Toilet: Handicapped height Bathroom Accessibility: Yes How Accessible: Accessible via walker Home Equipment: Tub bench;BSC/3in1;Rolling Walker (2 wheels);Rollator (4 wheels);Wheelchair - manual   Additional Comments: Uses BSC and places it near the toilet.      Prior Functioning/Environment Prior Level of Function : Needs assist       Physical Assist : ADLs (physical);Mobility (physical) Mobility (physical): Transfers ADLs (physical): Bathing;Dressing Mobility Comments: Pt reports she was getting better with transfers to [facility] using RW. ADLs Comments: Occasional  assist for ADLs since d/c        OT Problem List: Decreased strength;Decreased activity tolerance;Impaired balance (sitting and/or standing);Decreased knowledge of precautions;Decreased knowledge of use of DME or AE;Obesity;Pain      OT Treatment/Interventions: [name]/ADL training;Therapeutic exercise;DME and/or AE instruction;Balance training;Patient/family education;Therapeutic activities    OT Goals(Current goals can be found in the care plan section) Acute Rehab OT Goals Patient Stated Goal: To get stronger to care for herself at home OT Goal Formulation: With patient Time For Goal Achievement: 08/07/21 Potential to Achieve Goals: Good ADL Goals Pt Will Perform Lower Body Bathing: with mod assist;sitting/lateral leans;bed level;with adaptive equipment Pt Will Perform Lower Body Dressing: with mod assist;with adaptive equipment;sitting/lateral leans;bed level Pt Will Transfer to Toilet: with mod assist;with transfer board;bedside commode Pt Will Perform Toileting - Clothing Manipulation and hygiene: with mod assist;with adaptive equipment;sitting/lateral leans;bed level Pt/caregiver will Perform Home Exercise Program: Increased strength;Both right and left upper extremity;With theraband;Independently Additional ADL Goal #1: Pt will complete Bed Mobility with min A to prepare for seated ADL's.  OT Frequency: Min 2X/week   Barriers to D/C:            Co-evaluation              AM-PAC OT "6 Clicks" Daily Activity     Outcome Measure Help from another person eating meals?: A Little Help from another person taking care of personal  grooming?: A Little Help from another person toileting, which includes using toliet, bedpan, or urinal?: Total Help from another person bathing (including washing, rinsing, drying)?: A Lot Help from another person to put on and taking off regular upper body clothing?: A Little Help from another person to put on and taking off regular lower body  clothing?: Total 6 Click Score: 13   End of Session Equipment Utilized During Treatment: Gait belt;Rolling walker (2 wheels) Nurse Communication: Mobility status  Activity Tolerance: Patient limited by pain;Patient tolerated treatment well Patient left: with call bell/phone within reach;with family/visitor present  OT Visit Diagnosis: Unsteadiness on feet (R26.81);Other abnormalities of gait and mobility (R26.89);Muscle weakness (generalized) (M62.81);History of falling (Z91.81);Pain Pain - Right/Left: Right Pain - part of body: Leg                Time: 4431-5400 OT Time Calculation (min): 19 min Charges:  OT General Charges $OT Visit: 1 Visit OT Evaluation $OT Eval Moderate Complexity: 1 Mod  Anselma Herbel H., OTR/L Acute Rehabilitation  Devyon Keator Elane Bing Plume 07/24/2021, 5:57 PM

## 2021-07-24 NOTE — Progress Notes (Signed)
Occupational Therapy Treatment Patient Details Name: [name] MRN: [id number] DOB: 03/01/1961 Today's Date: 07/24/2021   History of present illness Pt is a 60 y/o female s/p R AKA on 11/11 secondary to complications for R BKA. PMH includes CAD, HTN, CHF, asthma, DM, and fibromyalgia.   OT comments  Pt seen for a second session today. She sat EOB for 30 mins, to eat her Lunch and visit with her son. Pt required total A +2 for bed mobility to return to supine, due to increased pain in RLE. Pt then participated in a full bath and linen change, with min A for rolling, and Total A for LB bathing. OT will continue to follow and work on functional mobility, ADL performance, Safety, and balance.    Recommendations for follow up therapy are one component of a multi-disciplinary discharge planning process, led by the attending physician.  Recommendations may be updated based on patient status, additional functional criteria and insurance authorization.    Follow Up Recommendations  Acute inpatient rehab (3hours/day)    Assistance Recommended at Discharge Frequent or constant Supervision/Assistance  Equipment Recommendations  None recommended by OT    Recommendations for Other Services Rehab consult    Precautions / Restrictions Precautions Precautions: Fall Precaution Comments: R AKA Restrictions Weight Bearing Restrictions: Yes RLE Weight Bearing: Non weight bearing       Mobility Bed Mobility Overal bed mobility: Needs Assistance Bed Mobility: Rolling;Sit to Supine Rolling: Min assist   Supine to sit: Min assist;HOB elevated Sit to supine: Total assist;+2 for physical assistance;+2 for safety/equipment   General bed mobility comments: Pt requiring total A to return to bed due to increased pain    Transfers Overall transfer level: Needs assistance Equipment used: Rolling walker (2 wheels) Transfers: Sit to/from Stand Sit to Stand: Total assist;+2 physical assistance;+2  safety/equipment           General transfer comment: Deferred due to pain     Balance Overall balance assessment: Needs assistance Sitting-balance support: No upper extremity supported Sitting balance-[name]: Good                                     ADL either performed or assessed with clinical judgement   ADL Overall ADL's : Needs assistance/impaired Eating/Feeding: Set up;Sitting   Grooming: Set up;Sitting   Upper Body Bathing: Minimal assistance;Sitting Upper Body Bathing Details (indicate cue type and reason): CBG Bath Lower Body Bathing: Total assistance;+2 for physical assistance;+2 for safety/equipment;Bed level Lower Body Bathing Details (indicate cue type and reason): CBG Bath Upper Body Dressing : Set up;Sitting   Lower Body Dressing: Maximal assistance;+2 for physical assistance;+2 for safety/equipment;Bed level   Toilet Transfer: Total assistance;+2 for physical assistance;+2 for safety/equipment;Requires drop arm   Toileting- Clothing Manipulation and Hygiene: Maximal assistance;+2 for physical assistance;+2 for safety/equipment;Bed level         General ADL Comments: Pt in increased pain with rolling in bed requiring max A for bathing    Extremity/Trunk Assessment Upper Extremity Assessment Upper Extremity Assessment: Overall WFL for tasks assessed   Lower Extremity Assessment Lower Extremity Assessment: Defer to PT evaluation   Cervical / Trunk Assessment Cervical / Trunk Assessment: Kyphotic    Vision Baseline Vision/History: 1 Wears glasses Ability to See in Adequate Light: 0 Adequate Patient Visual Report: No change from baseline Vision Assessment?: No apparent visual deficits   Perception     Praxis  Cognition Arousal/Alertness: Awake/alert Behavior During Therapy: WFL for tasks assessed/performed Overall Cognitive Status: Within Functional Limits for tasks assessed                                             Exercises     Shoulder Instructions       General Comments VSS on RA, Bleeding ulcer noted on L inner thigh    Pertinent Vitals/ Pain       Pain Assessment: Faces Faces Pain Scale: Hurts whole lot Pain Location: RLE Pain Descriptors / Indicators: Grimacing;Guarding;Moaning Pain Intervention(s): Limited activity within patient's tolerance;Monitored during session;Repositioned  Home Living Family/patient expects to be discharged to:: Private residence Living Arrangements: Spouse/significant other Available Help at Discharge: Family;Available 24 hours/day Type of Home: House Home Access: Ramped entrance     Home Layout: Two level;Able to live on main level with bedroom/bathroom Alternate Level Stairs-Number of Steps: flight Alternate Level Stairs-Rails: Left Bathroom Shower/Tub: Tub/shower unit   Bathroom Toilet: Handicapped height Bathroom Accessibility: Yes How Accessible: Accessible via walker Home Equipment: Tub bench;BSC/3in1;Rolling Walker (2 wheels);Rollator (4 wheels);Wheelchair - manual   Additional Comments: Uses BSC and places it near the toilet.      Prior Functioning/Environment              Frequency  Min 2X/week        Progress Toward Goals  OT Goals(current goals can now be found in the care plan section)  Progress towards OT goals: Progressing toward goals  Acute Rehab OT Goals Patient Stated Goal: To be able to care for herself OT Goal Formulation: With patient Time For Goal Achievement: 08/07/21 Potential to Achieve Goals: Good ADL Goals Pt Will Perform Lower Body Bathing: with mod assist;sitting/lateral leans;bed level;with adaptive equipment Pt Will Perform Lower Body Dressing: with mod assist;with adaptive equipment;sitting/lateral leans;bed level Pt Will Transfer to Toilet: with mod assist;with transfer board;bedside commode Pt Will Perform Toileting - Clothing Manipulation and hygiene: with mod assist;with adaptive  equipment;sitting/lateral leans;bed level Pt/caregiver will Perform Home Exercise Program: Increased strength;Both right and left upper extremity;With theraband;Independently Additional ADL Goal #1: Pt will complete Bed Mobility with min A to prepare for seated ADL's.  Plan Discharge plan remains appropriate;Frequency remains appropriate    Co-evaluation                 AM-PAC OT "6 Clicks" Daily Activity     Outcome Measure   Help from another person eating meals?: A Little Help from another person taking care of personal grooming?: A Little Help from another person toileting, which includes using toliet, bedpan, or urinal?: Total Help from another person bathing (including washing, rinsing, drying)?: A Lot Help from another person to put on and taking off regular upper body clothing?: A Little Help from another person to put on and taking off regular lower body clothing?: Total 6 Click Score: 13    End of Session Equipment Utilized During Treatment: Gait belt;Rolling walker (2 wheels)  OT Visit Diagnosis: Unsteadiness on feet (R26.81);Other abnormalities of gait and mobility (R26.89);Muscle weakness (generalized) (M62.81);History of falling (Z91.81);Pain Pain - Right/Left: Right Pain - part of body: Leg   Activity Tolerance Patient tolerated treatment well   Patient Left in bed;with call bell/phone within reach;with bed alarm set   Nurse Communication Mobility status        Time: 5093-2671 OT Time Calculation (min): 43  min  Charges: OT General Charges $OT Visit: 1 Visit OT Evaluation $OT Eval Moderate Complexity: 1 Mod OT Treatments $Self Care/Home Management : 38-52 mins  Ariane Ditullio H., OTR/L Acute Rehabilitation  Lynkin Saini Elane Christin [place] 07/24/2021, 7:05 PM

## 2021-07-25 LAB — GLUCOSE, CAPILLARY
Glucose-Capillary: 227 mg/dL — ABNORMAL HIGH (ref 70–99)
Glucose-Capillary: 200 mg/dL — ABNORMAL HIGH (ref 70–99)
Glucose-Capillary: 195 mg/dL — ABNORMAL HIGH (ref 70–99)
Glucose-Capillary: 200 mg/dL — ABNORMAL HIGH (ref 70–99)

## 2021-07-25 NOTE — Progress Notes (Signed)
Inpatient Rehabilitation Admissions Coordinator   I met with patient at bedside for rehab assessment. She was previously with CIR after initial BKA. She prefers CIR admit. I will begin insurance Auth with [facility]. I discussed the need for her to transition to po pain regimen prior to possible admit.  Danne Baxter, RN, MSN Rehab Admissions Coordinator [phone] 07/25/2021 11:28 AM

## 2021-07-25 NOTE — Consult Note (Signed)
   [facility] CM Inpatient Consult   07/25/2021  Krystyl Cannell Hilliker August 31, 1961 [id number]  [facility] [[facility]]  Accountable Care Organization [ACO] Patient: [facility]  Primary Care Provider:  Sharlene Dory, DO [facility], [facility], is an Embedded provider  Patient screened for less than 30 days readmission hospitalization with noted extreme high risk score for unplanned readmission risk.   Patient was screened for Embedded practice service needs for chronic care management. [On contact precautions]  Plan: Patient is currently being recommended for inpatient rehab, with insurance authorization needed and will assess for  TOC needs for post hospital needs.  Please contact for further questions,  Charlesetta Shanks, RN BSN CCM [facility]  [phone] business mobile phone Toll free office [phone]  Fax number: [phone] [email or web address]

## 2021-07-25 NOTE — Plan of Care (Signed)

## 2021-07-25 NOTE — Progress Notes (Signed)
Patient ID: [name], female   DOB: 1961-03-03, 60 y.o.   MRN: [id number] Patient is postop day 3 right above-the-knee amputation.  There is no drainage in the wound VAC canister the seal shows to xs.  Patient states the wound VAC dressing feels different and she feels like there are areas that are not touching the skin.  Patient ideally would like to discharge to inpatient rehab she was there last time.

## 2021-07-25 NOTE — Plan of Care (Signed)

## 2021-07-25 NOTE — Progress Notes (Signed)
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 PMH includes CAD, HTN, CHF, asthma, DM, and fibromyalgia.    PT Comments    Patient progressing slowly towards PT goals. Reports she has been getting to EOB over the weekend. Today, pt requires Min A for bed mobility and scooting hips. Able to stand with Max A of 2. Would do better with wide bariatric RW as pt positions RLE into hip abduction away from CoM so does not fit in walker when standing due to pain. Left sitting EOB per request for lunch. Recommend wide bariatric drop arm BSC. Rn notified to order. Encouraged sitting up for all meals. Will plan to progress to transfers next session as tolerated.   Recommendations for follow up therapy are one component of a multi-disciplinary discharge planning process, led by the attending physician.  Recommendations may be updated based on patient status, additional functional criteria and insurance authorization.  Follow Up Recommendations  Acute inpatient rehab (3hours/day)     Assistance Recommended at Discharge Frequent or constant Supervision/Assistance  Equipment Recommendations  None recommended by PT    Recommendations for Other Services       Precautions / Restrictions Precautions Precautions: Fall;Other (comment) Precaution Comments: Rt AKA, wound vac Restrictions Weight Bearing Restrictions: Yes RLE Weight Bearing: Non weight bearing     Mobility  Bed Mobility Overal bed mobility: Needs Assistance Bed Mobility: Supine to Sit     Supine to sit: Min assist;HOB elevated     General bed mobility comments: Assist with bringing hips forward, heavy use of rails, increased time/effort.    Transfers Overall transfer level: Needs assistance Equipment used: Rolling walker (2 wheels) Transfers: Sit  to/from Stand Sit to Stand: Max assist;+2 physical assistance           General transfer comment: Assist to power to standing with cues for hand placement and use of momentum, unable to stand on first attempt but stood on second attempt.    Ambulation/Gait               General Gait Details: Unable   Stairs             Wheelchair Mobility    Modified Rankin (Stroke Patients Only)       Balance Overall balance assessment: Needs assistance Sitting-balance support: Single extremity supported Sitting balance-[name]: Good     Standing balance support: During functional activity Standing balance-[name]: Poor Standing balance comment: reliant on external support and UE support. Limited standing tolerance.                            Cognition Arousal/Alertness: Awake/alert Behavior During Therapy: WFL for tasks assessed/performed Overall Cognitive Status: Within Functional Limits for tasks assessed                                          Exercises Amputee Exercises Hip Extension: AROM;Right;5 reps;Supine    General Comments General comments (skin integrity, edema, etc.): Wound vac intact.      Pertinent Vitals/Pain Pain Assessment: Faces Faces Pain Scale: Hurts even more Pain Location: RLE Pain Descriptors / Indicators: Grimacing;Guarding;Sore;Operative site guarding Pain Intervention(s): Monitored during session;Repositioned;RN  gave pain meds during session;Limited activity within patient's tolerance    Home Living                          Prior Function            PT Goals (current goals can now be found in the care plan section) Progress towards PT goals: Progressing toward goals    Frequency    Min 3X/week      PT Plan Current plan remains appropriate    Co-evaluation              AM-PAC PT "6 Clicks" Mobility   Outcome Measure  Help needed turning from your back to your  side while in a flat bed without using bedrails?: A Little Help needed moving from lying on your back to sitting on the side of a flat bed without using bedrails?: A Little Help needed moving to and from a bed to a chair (including a wheelchair)?: Total Help needed standing up from a chair using your arms (e.g., wheelchair or bedside chair)?: Total Help needed to walk in hospital room?: Total Help needed climbing 3-5 steps with a railing? : Total 6 Click Score: 10    End of Session Equipment Utilized During Treatment: Gait belt Activity Tolerance: Patient limited by pain Patient left: in bed;with call bell/phone within reach (sitting EOB) Nurse Communication: Mobility status PT Visit Diagnosis: Unsteadiness on feet (R26.81);Other abnormalities of gait and mobility (R26.89);Muscle weakness (generalized) (M62.81) Pain - Right/Left: Right Pain - part of body: Leg     Time: 7681-1572 PT Time Calculation (min) (ACUTE ONLY): 29 min  Charges:  $Therapeutic Activity: 23-37 mins                     [name], PT, DPT Acute Rehabilitation Services Pager [phone] Office [phone]      [name] 07/25/2021, 12:49 PM

## 2021-07-26 LAB — GLUCOSE, CAPILLARY
Glucose-Capillary: 203 mg/dL — ABNORMAL HIGH (ref 70–99)
Glucose-Capillary: 157 mg/dL — ABNORMAL HIGH (ref 70–99)
Glucose-Capillary: 183 mg/dL — ABNORMAL HIGH (ref 70–99)
Glucose-Capillary: 181 mg/dL — ABNORMAL HIGH (ref 70–99)

## 2021-07-26 MED ORDER — PROMETHAZINE HCL 25 MG PO TABS
12.5000 mg | ORAL_TABLET | ORAL | Status: DC | PRN
  Administered 2021-07-28: 22:00:00 12.5 mg via ORAL
  Filled 2021-07-26: qty 1

## 2021-07-26 NOTE — Progress Notes (Signed)
Patient ID: [name], female   DOB: 06/14/1961, 60 y.o.   MRN: [id number] Patient is postoperative day 4 right above-the-knee amputation.  Patient has the motivation and desire to pursue inpatient rehab.  Authorization is underway.  There is no drainage in the wound VAC canister there is not a good suction fit advance to the Praveena plus pump.

## 2021-07-27 ENCOUNTER — Telehealth: Payer: Self-pay | Admitting: *Deleted

## 2021-07-27 LAB — GLUCOSE, CAPILLARY
Glucose-Capillary: 192 mg/dL — ABNORMAL HIGH (ref 70–99)
Glucose-Capillary: 185 mg/dL — ABNORMAL HIGH (ref 70–99)
Glucose-Capillary: 169 mg/dL — ABNORMAL HIGH (ref 70–99)
Glucose-Capillary: 193 mg/dL — ABNORMAL HIGH (ref 70–99)

## 2021-07-27 LAB — BASIC METABOLIC PANEL
Anion gap: 11 (ref 5–15)
BUN: 18 mg/dL (ref 6–20)
CO2: 24 mmol/L (ref 22–32)
Calcium: 8.8 mg/dL — ABNORMAL LOW (ref 8.9–10.3)
Chloride: 100 mmol/L (ref 98–111)
Creatinine, Ser: 0.88 mg/dL (ref 0.44–1.00)
GFR, Estimated: >60 mL/min (ref >60)
Glucose, Bld: 236 mg/dL — ABNORMAL HIGH (ref 70–99)
Potassium: 4.4 mmol/L (ref 3.5–5.1)
Sodium: 135 mmol/L (ref 135–145)

## 2021-07-27 NOTE — Progress Notes (Signed)
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 Wound vac is not picking up much fluid. PMH includes CAD, HTN, CHF, asthma, DM, and fibromyalgia.    PT Comments    Pt was seen for sit to stand practice using side of bed as launch.  Pt was in a fair amount of pain on RLE with the effort, and instigated some nausea by the end of practice.  Contacted nursing for meds and finally was able to get both pain and nausea managed.  Followup with her for practice with transfers to [facility] and chair, but was in too much pain to stress RLE in that way.  Talked with pt about the anatomy of the surgery and impact on nervous system and pain with sciatic n exposure.  Follow along for goals of acute PT.   Recommendations for follow up therapy are one component of a multi-disciplinary discharge planning process, led by the attending physician.  Recommendations may be updated based on patient status, additional functional criteria and insurance authorization.  Follow Up Recommendations  Acute inpatient rehab (3hours/day)     Assistance Recommended at Discharge Frequent or constant Supervision/Assistance  Equipment Recommendations  None recommended by PT    Recommendations for Other Services       Precautions / Restrictions Precautions Precautions: Fall;Other (comment) Precaution Comments: Rt AKA, wound vac Restrictions Weight Bearing Restrictions: No RLE Weight Bearing: Non weight bearing Other Position/Activity Restrictions: has R AK amp out to the side in sitting, abducted for pain management     Mobility  Bed Mobility Overal bed mobility: Needs Assistance Bed Mobility: Supine to Sit Rolling: Min guard   Supine to sit: Min guard;Min assist     General bed mobility comments: min assist to side of bed and remains up     Transfers Overall transfer level: Needs assistance Equipment used: Rolling walker (2 wheels);1 person hand held assist Transfers: Sit to/from Stand Sit to Stand: From elevated surface;Total assist          Lateral/Scoot Transfers: Mod assist;From elevated surface General transfer comment: Pt assisted to side of bed with help to scoot out and places RLE out laterally for pain control    Ambulation/Gait               General Gait Details: unable to stand   Stairs             Wheelchair Mobility    Modified Rankin (Stroke Patients Only)       Balance Overall balance assessment: Needs assistance Sitting-balance support: Bilateral upper extremity supported;Single extremity supported Sitting balance-[name]: Good Sitting balance - Comments: minimizes pressure on LLE at times.                                    Cognition Arousal/Alertness: Awake/alert Behavior During Therapy: WFL for tasks assessed/performed Overall Cognitive Status: Within Functional Limits for tasks assessed                                          Exercises      General Comments General comments (skin integrity, edema, etc.): pt was in pain to  exert to stand from side of bed, notably limited for tolerance to      Pertinent Vitals/Pain Pain Assessment: Faces Faces Pain Scale: Hurts even more Pain Location: RLE Pain Descriptors / Indicators: Grimacing;Guarding;Sore;Operative site guarding Pain Intervention(s): Monitored during session;Repositioned;Premedicated before session;Patient requesting pain meds-RN notified;Other (comment) (asking for anti nausea meds)    Home Living                          Prior Function            PT Goals (current goals can now be found in the care plan section) Acute Rehab PT Goals Patient Stated Goal: to decrease pain Progress towards PT goals: Progressing toward goals    Frequency    Min  3X/week      PT Plan Current plan remains appropriate    Co-evaluation              AM-PAC PT "6 Clicks" Mobility   Outcome Measure  Help needed turning from your back to your side while in a flat bed without using bedrails?: A Little Help needed moving from lying on your back to sitting on the side of a flat bed without using bedrails?: A Little Help needed moving to and from a bed to a chair (including a wheelchair)?: A Lot Help needed standing up from a chair using your arms (e.g., wheelchair or bedside chair)?: Total Help needed to walk in hospital room?: Total Help needed climbing 3-5 steps with a railing? : Total 6 Click Score: 11    End of Session Equipment Utilized During Treatment: Gait belt Activity Tolerance: Patient limited by pain Patient left: in bed;with call bell/phone within reach (sitting on side of bed) Nurse Communication: Mobility status PT Visit Diagnosis: Unsteadiness on feet (R26.81);Other abnormalities of gait and mobility (R26.89);Muscle weakness (generalized) (M62.81) Pain - Right/Left: Right Pain - part of body: Hip;Leg     Time: 2778-2423 PT Time Calculation (min) (ACUTE ONLY): 27 min  Charges:  $Therapeutic Activity: 23-37 mins              Ivar Drape 07/27/2021, 4:01 PM  Samul Dada, PT PhD Acute Rehab Dept. Number: [facility] [id number] and [facility] [phone]

## 2021-07-27 NOTE — Progress Notes (Addendum)
Pharmacy Antibiotic Note  [name] is a 60 y.o. female admitted on 07/22/2021 with wound infection, dehiscence of R transtibial amputation. 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 Pharmacy has been consulted for Vancomycin dosing.    Initial Vancomycin dosing calculated with serum creatinine 1.39, which was from 07/05/21.  Creatinine 0.81 on 11/12 and regimen adjusted. Prior values in Sept-Oct 2022 were 0.82-1.12.  Creatinine up to 1.28 11/13.    Plan: Continue Vancomycin  1gm IV q24h Goal AUC 400-550. Expected AUC: 341 SCr used: 1.28 Will follow renal function (will get Scr today), clinical progress. Have messaged Dr. Lajoyce Corners re: abx plans Vancomycin levels at steady state if Vanc expected to continue >5-7 days.  Height: 5\' 2"  (157.5 cm) Weight: 132 kg (291 lb) IBW/kg (Calculated) : 50.1  Temp (24hrs), Avg:98.4 F (36.9 C), Min:98.3 F (36.8 C), Max:98.5 F (36.9 C)  Recent Labs  Lab 07/23/21 0616 07/24/21 0213  WBC 10.8* 13.5*  CREATININE 0.81 1.28*     Estimated Creatinine Clearance: 61.2 mL/min (A) (by C-G formula based on SCr of 1.28 mg/dL (H)).    Allergies  Allergen Reactions   Fish Allergy Anaphylaxis    INCLUDES OMEGA 3 OILS   Fish Oil Anaphylaxis and Swelling    THROAT SWELLS INCLUDES FISH AS A CLASS   Omega-3 Fatty Acids Swelling    Throat swelling  Has tolerated Glucerna nutritional drinks    Crestor [Rosuvastatin] Other (See Comments)    Extreme joint pain, to this & other statins   Gabapentin Anxiety    Anxiety on high doses   Lovastatin Other (See Comments)    EXTREME JOINT PAIN   Metronidazole Nausea Only and Swelling    Headache and shakes Nausea and vomiting   Red Yeast Rice [Cholestin] Other (See Comments)    Muscle pain and severe joint pain.    Rotigotine Swelling    (Neupro) Extreme edema    Atrovent Hfa [Ipratropium Bromide Hfa] Other (See Comments)    wheezing   Iodine Swelling    Other Other (See Comments)    Surgical Staples causes redness/infection per patient.   Pepto-Bismol [Bismuth Subsalicylate] Nausea And Vomiting    Extreme vomiting   Red Yeast Rice Extract     Joint pain   Victoza [Liraglutide]     Unsure of reaction    Enalapril Cough   Suprep [Na Sulfate-K Sulfate-Mg Sulf] Nausea And Vomiting   Tape Other (See Comments)    Electrodes causes skin breakdown    Antimicrobials this admission:  Cefazolin 11/11>>11/12  Vancomycin 11/11 >>  Fluconazole  150 mg PO q72h from PTA >>  Mupirocin nasal BID 11/12 >> (11/16)  Dose adjustments this admission:  11/12: Scr 1.39 > 0.81. Empiric change from 1gm to 1750 mg IV q24h  11/13: Scr 1.28: back to 1gm IV q24h  Microbiology results:  11/11 surgical PCR: positive  11/11 COVID and flu: negative  [name] A. 13/11, PharmD, BCPS, FNKF Clinical Pharmacist [place] Please utilize Amion for appropriate phone number to reach the unit pharmacist [facility])   Addendum: d/w [facility], abx no longer need due to clean margins. Will d/c vancomycin. Scr 0.88  [name] A. Lajoyce Corners, PharmD, BCPS, FNKF Clinical Pharmacist [place] Please utilize Amion for appropriate phone number to reach the unit pharmacist [facility])  07/27/2021 9:21 AM

## 2021-07-27 NOTE — Progress Notes (Signed)
Report received and care assumed from previous shift RN. VS obtained, shift assessments completed - see flowsheets. PRN pain medications given as needed - see MAR. R LE wound vac remains CDI. Patient currently resting in bed, bed in lowest position. Denies needs. Call bell within reach.

## 2021-07-27 NOTE — Progress Notes (Signed)
Inpatient Rehabilitation Admissions Coordinator   I await insurance determination for  a possible CIR admit.  Ottie Glazier, RN, MSN Rehab Admissions Coordinator [phone] 07/27/2021 6:56 PM

## 2021-07-27 NOTE — Telephone Encounter (Signed)
Oral swab drug screen was consistent for prescribed medications.  ?

## 2021-07-27 NOTE — Plan of Care (Signed)

## 2021-07-28 LAB — GLUCOSE, CAPILLARY
Glucose-Capillary: 208 mg/dL — ABNORMAL HIGH (ref 70–99)
Glucose-Capillary: 266 mg/dL — ABNORMAL HIGH (ref 70–99)
Glucose-Capillary: 248 mg/dL — ABNORMAL HIGH (ref 70–99)
Glucose-Capillary: 194 mg/dL — ABNORMAL HIGH (ref 70–99)
Glucose-Capillary: 186 mg/dL — ABNORMAL HIGH (ref 70–99)

## 2021-07-28 NOTE — Progress Notes (Signed)
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 Wound vac is not picking up much fluid. PMH includes CAD, HTN, CHF, asthma, DM, and fibromyalgia.   OT comments  Patient received sitting on EOB and had recently returned from using [facility] with staff. Patient was able to perform grooming seated on EOB with setup. Patient educated on AE use for doffing and donning socks. Demonstration provided and patient was able to return demonstration with min assist. Patient performed 2 stands from EOB with mod assist and tolerated 1 minute on first attempt and 45 seconds the second.  Patient asked to remain seated on EOB. Acute OT to continue to follow.    Recommendations for follow up therapy are one component of a multi-disciplinary discharge planning process, led by the attending physician.  Recommendations may be updated based on patient status, additional functional criteria and insurance authorization.    Follow Up Recommendations  Acute inpatient rehab (3hours/day)    Assistance Recommended at Discharge Frequent or constant Supervision/Assistance  Equipment Recommendations  None recommended by OT    Recommendations for Other Services      Precautions / Restrictions Precautions Precautions: Fall;Other (comment) Precaution Comments: Rt AKA, wound vac Restrictions Weight Bearing Restrictions: Yes RLE Weight Bearing: Non weight bearing Other Position/Activity Restrictions: has R AK amp out to the side in sitting, abducted for pain management       Mobility Bed Mobility               General bed mobility comments: patient sitting on EOB upon arrival    Transfers Overall transfer level: Needs assistance Equipment used: Rolling walker (2 wheels) Transfers: Sit to/from Stand Sit to Stand: Mod assist;From  elevated surface           General transfer comment: performed sit to stands from EOB with mod assist to stand     Balance Overall balance assessment: Needs assistance Sitting-balance support: Bilateral upper extremity supported;Single extremity supported Sitting balance-[name]: Good Sitting balance - Comments: able to perform self care tasks seated on EOB   Standing balance support: Bilateral upper extremity supported Standing balance-[name]: Poor Standing balance comment: reliant on UE support for balance, tolerated one minute for first stand and 45 seconds second stand                           ADL either performed or assessed with clinical judgement   ADL Overall ADL's : Needs assistance/impaired     Grooming: Wash/dry hands;Wash/dry face;Oral care;Brushing hair;Set up;Sitting Grooming Details (indicate cue type and reason): performed seated on  EOB             Lower Body Dressing: Minimal assistance;With adaptive equipment;Sitting/lateral leans Lower Body Dressing Details (indicate cue type and reason): instructions on doffing and donning sock on LLE with AE. Patient instructed on reacher to doff sock and sock aid to donn with patient demonstrating good understanding and could benefit from further education               General ADL Comments: performed grooming and LB dressing seated on eob    Extremity/Trunk Assessment              Vision       Perception     Praxis  Cognition Arousal/Alertness: Awake/alert Behavior During Therapy: WFL for tasks assessed/performed Overall Cognitive Status: Within Functional Limits for tasks assessed                                 General Comments: aware of limitations          Exercises     Shoulder Instructions       General Comments      Pertinent Vitals/ Pain       Pain Assessment: Faces Faces Pain Scale: Hurts even more Pain Location: RLE Pain  Descriptors / Indicators: Grimacing;Guarding;Sore;Operative site guarding Pain Intervention(s): Monitored during session  Home Living                                          Prior Functioning/Environment              Frequency  Min 2X/week        Progress Toward Goals  OT Goals(current goals can now be found in the care plan section)  Progress towards OT goals: Progressing toward goals  Acute Rehab OT Goals Patient Stated Goal: go to rehab OT Goal Formulation: With patient Time For Goal Achievement: 08/07/21 Potential to Achieve Goals: Good ADL Goals Pt Will Perform Lower Body Bathing: with mod assist;sitting/lateral leans;bed level;with adaptive equipment Pt Will Perform Lower Body Dressing: with mod assist;with adaptive equipment;sitting/lateral leans;bed level Pt Will Transfer to Toilet: with mod assist;with transfer board;bedside commode Pt Will Perform Toileting - Clothing Manipulation and hygiene: with mod assist;with adaptive equipment;sitting/lateral leans;bed level Pt/caregiver will Perform Home Exercise Program: Increased strength;Both right and left upper extremity;With theraband;Independently Additional ADL Goal #1: Pt will complete Bed Mobility with min A to prepare for seated ADL's.  Plan Discharge plan remains appropriate;Frequency remains appropriate    Co-evaluation                 AM-PAC OT "6 Clicks" Daily Activity     Outcome Measure   Help from another person eating meals?: A Little Help from another person taking care of personal grooming?: A Little Help from another person toileting, which includes using toliet, bedpan, or urinal?: A Lot Help from another person bathing (including washing, rinsing, drying)?: A Lot Help from another person to put on and taking off regular upper body clothing?: A Little Help from another person to put on and taking off regular lower body clothing?: A Lot 6 Click Score: 15    End of  Session Equipment Utilized During Treatment: Rolling walker (2 wheels)  OT Visit Diagnosis: Unsteadiness on feet (R26.81);Other abnormalities of gait and mobility (R26.89);Muscle weakness (generalized) (M62.81);History of falling (Z91.81);Pain Pain - Right/Left: Right Pain - part of body: Leg   Activity Tolerance Patient tolerated treatment well   Patient Left in bed;with call bell/phone within reach   Nurse Communication Mobility status        Time: 4196-2229 OT Time Calculation (min): 30 min  Charges: OT General Charges $OT Visit: 1 Visit OT Treatments $Self Care/Home Management : 8-22 mins $Therapeutic Activity: 8-22 mins  [name], OTA Acute Rehabilitation Services  Pager [phone] Office [phone]   Dewain Penning 07/28/2021, 9:22 AM

## 2021-07-28 NOTE — Progress Notes (Signed)
Mobility Specialist Progress Note   07/28/21 1730  Mobility  Activity Transferred to/from [facility]  Level of Assistance Moderate assist, patient does 50-74% (+2 for safety)  Assistive Device Front wheel walker  Distance Ambulated (ft) 8 ft  Mobility Out of bed for toileting;Ambulated with assistance in room  Mobility Response Tolerated well  Mobility performed by Mobility specialist (Nurse)  $Mobility charge 1 Mobility   Received pt in bed needing assistance to [facility]. Pt mod ind for bed mobility and STS, modA for steadiness during ambulation. X1 seated break on BSC before using, successful void. After void, pt able to hop forward x3 and backwards x3 but fatigued very quickly. Returned back to EOB w/ call bell by side and Nurse present.   Frederico Hamman Mobility Specialist Phone Number [phone]

## 2021-07-29 ENCOUNTER — Telehealth: Payer: Self-pay | Admitting: Orthopedic Surgery

## 2021-07-29 LAB — GLUCOSE, CAPILLARY: Glucose-Capillary: 229 mg/dL — ABNORMAL HIGH (ref 70–99)

## 2021-07-29 MED ORDER — OXYCODONE HCL 5 MG PO TABS: 5.0000 mg | ORAL_TABLET | ORAL | 0 refills | Status: DC | PRN

## 2021-07-29 NOTE — Telephone Encounter (Signed)
Called to advise.  

## 2021-07-29 NOTE — Telephone Encounter (Signed)
Fraser Din from [employer] called. He says patient is already taking oxycodone every 8hrs. Is Dr. Eather Colas of this and if so, does he want her to continue that and add the RX prescribed. Call back number is [phone]

## 2021-07-29 NOTE — Plan of Care (Signed)
  Problem: Pain Managment: Goal: General experience of comfort will improve Outcome: Progressing  Will continue to monitor pain levels and medicate pt as needed as well as other forms of pain management.

## 2021-07-29 NOTE — Progress Notes (Signed)
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 Wound vac is not picking up much fluid. PMH includes CAD, HTN, CHF, asthma, DM, and fibromyalgia.    PT Comments    Pt reporting significant R residual limb pain, but eager to d/c home today. Pt transferred to/from [facility] with close guard and min cuing for pt safety, PT managing lines/leads during pivots. Pt requiring increased time, and is tearful about medical setbacks thus far with RLE. Pt has great support at home from husband, will be transfer level and has needed equipment. Recommending HHPT.     Recommendations for follow up therapy are one component of a multi-disciplinary discharge planning process, led by the attending physician.  Recommendations may be updated based on patient status, additional functional criteria and insurance authorization.  Follow Up Recommendations  Home health PT     Assistance Recommended at Discharge Frequent or constant Supervision/Assistance  Equipment Recommendations  None recommended by PT    Recommendations for Other Services       Precautions / Restrictions Precautions Precautions: Fall;Other (comment) Precaution Comments: Rt AKA, wound vac Restrictions Weight Bearing Restrictions: Yes RLE Weight Bearing: Non weight bearing     Mobility  Bed Mobility Overal bed mobility: Needs Assistance             General bed mobility comments: patient sitting on EOB upon arrival    Transfers Overall transfer level: Needs assistance Equipment used: Rolling walker (2 wheels) Transfers: Bed to chair/wheelchair/BSC   Stand pivot transfers: Min guard         General transfer comment: for safety, management of lines/leads. Cues for pivot, safely reaching destination surface. STS x2, to and from [facility].     Ambulation/Gait               General Gait Details: nt   Social research officer, government Rankin (Stroke Patients Only)       Balance Overall balance assessment: Needs assistance Sitting-balance support: Bilateral upper extremity supported;Single extremity supported Sitting balance-[name]: Good     Standing balance support: Bilateral upper extremity supported;Reliant on assistive device for balance Standing balance-[name]: Poor                              Cognition Arousal/Alertness: Awake/alert Behavior During Therapy: WFL for tasks assessed/performed Overall Cognitive Status: Within Functional Limits for tasks assessed                                 General Comments: aware of limitations, tearful during session about revision surgery        Exercises      General Comments        Pertinent Vitals/Pain Pain Assessment: Faces Faces Pain Scale: Hurts even more Pain Location: RLE Pain Descriptors / Indicators: Grimacing;Guarding;Sore;Operative site guarding Pain Intervention(s): Limited activity within patient's tolerance;Monitored during session;Repositioned    Home Living                          Prior Function            PT Goals (  current goals can now be found in the care plan section) Acute Rehab PT Goals Patient Stated Goal: to decrease pain PT Goal Formulation: With patient Time For Goal Achievement: 08/06/21 Potential to Achieve Goals: Good Progress towards PT goals: Progressing toward goals    Frequency    Min 3X/week      PT Plan Current plan remains appropriate    Co-evaluation              AM-PAC PT "6 Clicks" Mobility   Outcome Measure  Help needed turning from your back to your side while in a flat bed without using bedrails?: A Little Help needed moving from lying on your back to sitting on the side of a flat bed without using bedrails?: A  Little Help needed moving to and from a bed to a chair (including a wheelchair)?: A Little Help needed standing up from a chair using your arms (e.g., wheelchair or bedside chair)?: A Little Help needed to walk in hospital room?: A Lot Help needed climbing 3-5 steps with a railing? : Total 6 Click Score: 15    End of Session Equipment Utilized During Treatment: Gait belt Activity Tolerance: Patient limited by pain Patient left: in bed;with call bell/phone within reach (sitting on side of bed) Nurse Communication: Mobility status PT Visit Diagnosis: Unsteadiness on feet (R26.81);Other abnormalities of gait and mobility (R26.89);Muscle weakness (generalized) (M62.81) Pain - Right/Left: Right Pain - part of body: Hip;Leg     Time: [id number] PT Time Calculation (min) (ACUTE ONLY): 28 min  Charges:  $Therapeutic Activity: 23-37 mins                     Stacie Glaze, PT DPT Acute Rehabilitation Services Pager [phone]  Office [phone]   [place] E Ruffin Pyo 07/29/2021, 10:51 AM

## 2021-07-29 NOTE — Discharge Summary (Signed)
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 female who was admitted 07/22/2021 with a chief complaint of dehiscence right below knee amputation , with a final diagnosis of Dehiscence Right Below Knee Amputation.  Patient was brought to the operating room on 07/22/2021 and underwent Procedure(s): RIGHT ABOVE KNEE AMPUTATION.    Patient was given perioperative antibiotics:  Anti-infectives (From admission, onward)    Start     Dose/Rate Route Frequency Ordered Stop   07/24/21 1800  vancomycin (VANCOREADY) IVPB 1000 mg/200 mL  Status:  Discontinued        1,000 mg 200 mL/hr over 60 Minutes Intravenous Every 24 hours 07/24/21 1523 07/27/21 1056   07/23/21 2300  ceFAZolin (ANCEF) IVPB 2g/100 mL premix  Status:  Discontinued        2 g 200 mL/hr over 30 Minutes Intravenous Every 8 hours 07/22/21 2057 07/22/21 2117   07/23/21 1900  vancomycin (VANCOREADY) IVPB 1000 mg/200 mL  Status:  Discontinued        1,000 mg 200 mL/hr over 60 Minutes Intravenous Every 24 hours 07/22/21 1622 07/23/21 1545   07/23/21 1800  vancomycin (VANCOREADY) IVPB 1750 mg/350 mL  Status:  Discontinued        1,750 mg 175 mL/hr over 120 Minutes Intravenous Every 24 hours 07/23/21 1545 07/24/21 1523   07/22/21 2300  ceFAZolin (ANCEF) IVPB 2g/100 mL premix        2 g 200 mL/hr over 30 Minutes Intravenous Every 8 hours 07/22/21 2117 07/23/21 0645   07/22/21 2200  hydroxychloroquine (PLAQUENIL) tablet 200 mg        200 mg Oral 2 times daily 07/22/21 1729     07/22/21 1900  vancomycin (VANCOREADY) IVPB 2000 mg/400 mL        2,000 mg 200 mL/hr over 120 Minutes Intravenous  Once 07/22/21 1622 07/22/21 2108   07/22/21 1730  fluconazole  (DIFLUCAN) tablet 150 mg        150 mg Oral every 72 hours 07/22/21 1729     07/22/21 1730  ceFAZolin (ANCEF) IVPB 2g/100 mL premix  Status:  Discontinued        2 g 200 mL/hr over 30 Minutes Intravenous Every 8 hours 07/22/21 1729 07/22/21 2057   07/22/21 1415  vancomycin (VANCOREADY) IVPB 1500 mg/300 mL  Status:  Discontinued        1,500 mg 150 mL/hr over 120 Minutes Intravenous To Surgery 07/22/21 1407 07/22/21 1444   07/22/21 1412  vancomycin (VANCOCIN) 1-5 GM/200ML-% IVPB  Status:  Discontinued       Note to Pharmacy: [name] Sprang   : cabinet override      07/22/21 1412 07/22/21 1432   07/22/21 1345  ceFAZolin (ANCEF) IVPB 3g/100 mL premix  Status:  Discontinued        3 g 200 mL/hr over 30 Minutes Intravenous On call to O.R. 07/22/21 1254 07/22/21 1729   07/22/21 1255  ceFAZolin (ANCEF) 2-4 GM/100ML-% IVPB       Note to Pharmacy: Roosvelt Maser   : cabinet override      07/22/21 1255 07/23/21 0059     .  Patient was given sequential compression devices, early ambulation, and aspirin for DVT prophylaxis.  Recent vital  signs: Patient Vitals for the past 24 hrs:  BP Temp Temp src Pulse Resp SpO2  07/29/21 0754 (!) 159/64 98.1 F (36.7 C) Oral 88 18 97 %  07/29/21 0426 137/70 97.9 F (36.6 C) -- 85 18 95 %  07/28/21 2201 -- -- -- -- -- 98 %  07/28/21 2131 117/63 98.3 F (36.8 C) -- 90 17 96 %  07/28/21 1104 (!) 153/65 98.9 F (37.2 C) Oral 100 18 97 %  .  Recent laboratory studies: No results found.  Discharge Medications:   Allergies as of 07/29/2021       Reactions   Fish Allergy Anaphylaxis   INCLUDES OMEGA 3 OILS   Fish Oil Anaphylaxis, Swelling   THROAT SWELLS INCLUDES FISH AS A CLASS   Omega-3 Fatty Acids Swelling   Throat swelling  Has tolerated Glucerna nutritional drinks   Crestor [rosuvastatin] Other (See Comments)   Extreme joint pain, to this & other statins   Gabapentin Anxiety   Anxiety on high doses   Lovastatin Other (See Comments)    EXTREME JOINT PAIN   Metronidazole Nausea Only, Swelling   Headache and shakes Nausea and vomiting   Red Yeast Rice [cholestin] Other (See Comments)   Muscle pain and severe joint pain.    Rotigotine Swelling   (Neupro) Extreme edema   Atrovent Hfa [ipratropium Bromide Hfa] Other (See Comments)   wheezing   Iodine Swelling   Other Other (See Comments)   Surgical Staples causes redness/infection per patient.   Pepto-bismol [bismuth Subsalicylate] Nausea And Vomiting   Extreme vomiting   Red Yeast Rice Extract    Joint pain   Victoza [liraglutide]    Unsure of reaction    Enalapril Cough   Suprep [na Sulfate-k Sulfate-mg Sulf] Nausea And Vomiting   Tape Other (See Comments)   Electrodes causes skin breakdown        Medication List     STOP taking these medications    traMADol 50 MG tablet Commonly known as: ULTRAM       TAKE these medications    albuterol (2.5 MG/3ML) 0.083% nebulizer solution Commonly known as: PROVENTIL USE 1 VIAL IN NEBULIZER EVERY 4 HOURS AS NEEDED FOR WHEEZING OR  FOR  SHORTNESS  OF  BREATH What changed: See the new instructions.   albuterol 108 (90 Base) MCG/ACT inhaler Commonly known as: VENTOLIN HFA Inhale 2 puffs into the lungs every 4 (four) hours as needed for shortness of breath or wheezing. What changed: Another medication with the same name was changed. Make sure you understand how and when to take each.   aspirin EC 81 MG tablet Take 81 mg by mouth every evening.   betamethasone dipropionate 0.05 % cream Apply 1 application topically 2 (two) times daily as needed (extreme itching).   camphor-menthol lotion Commonly known as: SARNA Apply topically as needed for itching.   Cartia XT 240 MG 24 hr capsule Generic drug: diltiazem Take 1 capsule (240 mg total) by mouth daily. What changed: when to take this   cephALEXin 500 MG capsule Commonly known as: KEFLEX Take 1 capsule (500 mg total) by mouth every 6 (six) hours.    cyclobenzaprine 10 MG tablet Commonly known as: FLEXERIL Take 1 tablet (10 mg total) by mouth 3 (three) times daily as needed for muscle spasms.   diclofenac Sodium 1 % Gel Commonly known as: VOLTAREN Apply 2 g topically 4 (four) times daily. What changed: when to take this   diphenhydrAMINE 25 MG  tablet Commonly known as: BENADRYL Take 25 mg by mouth daily as needed for allergies or itching.   doxycycline 100 MG tablet Commonly known as: VIBRA-TABS Take 1 tablet (100 mg total) by mouth 2 (two) times daily.   EPINEPHrine 0.3 mg/0.3 mL Soaj injection Commonly known as: EpiPen 2-Pak USE AS DIRECTED FOR SEVERE ALLERGIC REACTION. What changed:  how much to take how to take this when to take this reasons to take this additional instructions   ezetimibe 10 MG tablet Commonly known as: ZETIA Take 1 tablet (10 mg total) by mouth daily. What changed: when to take this   fluconazole 150 MG tablet Commonly known as: DIFLUCAN Take 150 mg by mouth every 3 (three) days.   fluticasone 50 MCG/ACT nasal spray Commonly known as: FLONASE USE 2 SPRAY(S) IN EACH NOSTRIL ONCE DAILY AS NEEDED FOR  ALLERGIES  OR  RHINITIS What changed: See the new instructions.   folic acid 326 MCG tablet Commonly known as: FOLVITE Take 1,600 mcg by mouth daily at 12 noon.   furosemide 20 MG tablet Commonly known as: LASIX Take 20-40 mg by mouth 3 (three) times daily as needed (fluid retention/swelling).   gabapentin 400 MG capsule Commonly known as: NEURONTIN Take 1 capsule by mouth in the morning, 1 capsule at noon, and 2 capsules at bed time.   GLUCOMANNAN PO Take 1,995 mg by mouth in the morning, at noon, and at bedtime.   glucose blood test strip 1 each by Other route 4 (four) times daily. Contour Next strips   HUMULIN R 500 UNIT/ML injection Generic drug: insulin regular human CONCENTRATED Inject 0.08-0.3 mLs (40-150 Units total) into the skin 3 (three) times daily with meals. What  changed: how much to take   Hydrocortisone Max St 1 % Generic drug: hydrocortisone cream Apply 1 application topically 3 (three) times daily as needed for itching (minor skin irritation).   hydroxychloroquine 200 MG tablet Commonly known as: PLAQUENIL Take 1 tablet (200 mg total) by mouth 2 (two) times daily.   INSULIN SYRINGE 1CC/29G 29G X 1/2" 1 ML Misc Commonly known as: B-D INSULIN SYRINGE Use three times daily with insulin.  DX E11.9   isosorbide mononitrate 30 MG 24 hr tablet Commonly known as: IMDUR Take 1 tablet (30 mg total) by mouth daily. What changed: when to take this   levocetirizine 5 MG tablet Commonly known as: XYZAL TAKE 1 TABLET BY MOUTH ONCE DAILY IN THE EVENING   levothyroxine 200 MCG tablet Commonly known as: SYNTHROID TAKE 1 TABLET BY MOUTH ONCE DAILY BEFORE BREAKFAST   lidocaine 5 % Commonly known as: Lidoderm Place 1 patch onto the skin daily. Remove & Discard patch within 12 hours or as directed by MD   metFORMIN 500 MG 24 hr tablet Commonly known as: GLUCOPHAGE-XR Take 2 tablets by mouth twice daily   methocarbamol 750 MG tablet Commonly known as: ROBAXIN Take 750 mg by mouth every 8 (eight) hours as needed for muscle spasms.   mupirocin ointment 2 % Commonly known as: BACTROBAN Apply 1 application topically 2 (two) times daily as needed (wound care).   N-Acetyl Cysteine 600 MG Tabs Generic drug: Acetylcysteine (Nutrient) Take 600 mg by mouth in the morning and at bedtime.   naloxone 4 MG/0.1ML Liqd nasal spray kit Commonly known as: NARCAN Place 1 spray into the nose as needed (opoid overdose).   nitroGLYCERIN 0.4 MG SL tablet Commonly known as: NITROSTAT DISSOLVE ONE TABLET UNDER THE TONGUE EVERY 5 MINUTES AS NEEDED  FOR CHEST PAIN.  DO NOT EXCEED A TOTAL OF 3 DOSES IN 15 MINUTES What changed: See the new instructions.   omeprazole 40 MG capsule Commonly known as: PRILOSEC Take 1 capsule (40 mg total) by mouth daily.   oxyCODONE  5 MG immediate release tablet Commonly known as: Roxicodone Take 1 tablet (5 mg total) by mouth every 4 (four) hours as needed for severe pain. What changed:  medication strength how much to take when to take this reasons to take this   oxymetazoline 0.05 % nasal spray Commonly known as: AFRIN Place 2 sprays into both nostrils 2 (two) times daily.   POT BICARB-POT CHLORIDE(25MEQ) 25 MEQ Tbef Take 25 mEq by mouth 3 (three) times daily as needed (with lasix).   pramipexole 1 MG tablet Commonly known as: MIRAPEX Take 1 tablet (1 mg total) by mouth See admin instructions. Take 1 tablet (1 mg) by mouth scheduled 3 times daily & take 0.5 tablet (0.5 mg) by mouth twice daily if needed for breakthrough restless leg   pregabalin 25 MG capsule Commonly known as: LYRICA Take 1 capsule (25 mg total) by mouth 2 (two) times daily.   promethazine 25 MG tablet Commonly known as: PHENERGAN Take 1 tablet (25 mg total) by mouth every 8 (eight) hours as needed for vomiting or nausea.   saccharomyces boulardii 250 MG capsule Commonly known as: FLORASTOR Take 250 mg by mouth 3 (three) times daily.   Santyl ointment Generic drug: collagenase Apply 1 application topically daily.   Systane 0.4-0.3 % Soln Generic drug: Polyethyl Glycol-Propyl Glycol Place 1-2 drops into both eyes in the morning.   tiZANidine 2 MG tablet Commonly known as: ZANAFLEX Take 1 tablet (2 mg total) by mouth 3 (three) times daily. What changed:  when to take this reasons to take this   valsartan 160 MG tablet Commonly known as: DIOVAN Take 1 tablet (160 mg total) by mouth daily. What changed: when to take this   vitamin B-12 1000 MCG tablet Commonly known as: CYANOCOBALAMIN Take 1,000 mcg by mouth in the morning.   vitamin C 500 MG tablet Commonly known as: ASCORBIC ACID Take 1 tablet (500 mg total) by mouth 2 (two) times daily.   Vitamin D (Ergocalciferol) 1.25 MG (50000 UNIT) Caps capsule Commonly known  as: DRISDOL TAKE 1 CAPSULE BY MOUTH ONCE A WEEK (EVERY  7  DAYS)  TAKE  ON  FRIDAY What changed:  how much to take how to take this when to take this   Zinc Sulfate 220 (50 Zn) MG Tabs Take 1 tablet (220 mg total) by mouth daily. What changed: when to take this        Diagnostic Studies: No results found.  Patient benefited maximally from their hospital stay and there were no complications.     Disposition: Discharge disposition: 01-Home or Self Care      Discharge Instructions     Call MD / Call 911   Complete by: As directed    If you experience chest pain or shortness of breath, CALL 911 and be transported to the hospital emergency room.  If you develope a fever above 101 F, pus (white drainage) or increased drainage or redness at the wound, or calf pain, call your surgeon's office.   Constipation Prevention   Complete by: As directed    Drink plenty of fluids.  Prune juice may be helpful.  You may use a stool softener, such as Colace (over the counter) 100 mg twice  a day.  Use MiraLax (over the counter) for constipation as needed.   Diet - low sodium heart healthy   Complete by: As directed    Increase activity slowly as tolerated   Complete by: As directed    Negative Pressure Wound Therapy - Incisional   Complete by: As directed    Post-operative opioid taper instructions:   Complete by: As directed    POST-OPERATIVE OPIOID TAPER INSTRUCTIONS: It is important to wean off of your opioid medication as soon as possible. If you do not need pain medication after your surgery it is ok to stop day one. Opioids include: Codeine, Hydrocodone(Norco, Vicodin), Oxycodone(Percocet, oxycontin) and hydromorphone amongst others.  Long term and even short term use of opiods can cause: Increased pain response Dependence Constipation Depression Respiratory depression And more.  Withdrawal symptoms can include Flu like symptoms Nausea, vomiting And more Techniques to manage  these symptoms Hydrate well Eat regular healthy meals Stay active Use relaxation techniques(deep breathing, meditating, yoga) Do Not substitute Alcohol to help with tapering If you have been on opioids for less than two weeks and do not have pain than it is ok to stop all together.  Plan to wean off of opioids This plan should start within one week post op of your joint replacement. Maintain the same interval or time between taking each dose and first decrease the dose.  Cut the total daily intake of opioids by one tablet each day Next start to increase the time between doses. The last dose that should be eliminated is the evening dose.          Follow-up Information     Newt Minion, MD Follow up.   Specialty: Orthopedic Surgery Contact information: [address] [place] [address] [phone]                  Signed: Newt Minion 07/29/2021, 8:50 AM

## 2021-07-29 NOTE — Progress Notes (Signed)
Inpatient Rehabilitation Admissions Coordinator   Notified by Cora Collum that patient has chosen to discharge home . We will sign off at this time.  Ottie Glazier, RN, MSN Rehab Admissions Coordinator [phone] 07/29/2021 10:40 AM

## 2021-07-29 NOTE — TOC Transition Note (Incomplete)
°  Transition of Care [facility]) - CM/SW Discharge Note   Patient Details  Name: [name] MRN: [id number] Date of Birth: [birth year]  [facility] CM/SW Contact:  Epifanio Lesches, RN Phone Number: 07/29/2021, 11:51 AM   Clinical Narrative:       Final next level of care: Home w Home Health Services Barriers to Discharge: No Barriers Identified   Patient Goals and CMS Choice     Choice offered to / list presented to : Patient  Discharge Placement                       Discharge Plan and Services                          HH Arranged: PT, OT, Nurse's Aide HH Agency: [facility] Date [facility] Agency Contacted: 07/29/21 Time HH Agency Contacted: 1148 Representative spoke with at [facility] Agency: sTACIE  Social Determinants of Health (SDOH) Interventions     Readmission Risk Interventions No flowsheet data found.

## 2021-07-29 NOTE — Plan of Care (Signed)
  Problem: Education: Goal: Knowledge of General Education information will improve Description: Including pain rating scale, medication(s)/side effects and non-pharmacologic comfort measures Outcome: Adequate for Discharge   Problem: Health Behavior/Discharge Planning: Goal: Ability to manage health-related needs will improve Outcome: Adequate for Discharge   Problem: Clinical Measurements: Goal: Ability to maintain clinical measurements within normal limits will improve Outcome: Adequate for Discharge Goal: Will remain free from infection Outcome: Adequate for Discharge Goal: Diagnostic test results will improve Outcome: Adequate for Discharge Goal: Respiratory complications will improve Outcome: Adequate for Discharge Goal: Cardiovascular complication will be avoided Outcome: Adequate for Discharge   Problem: Activity: Goal: Risk for activity intolerance will decrease Outcome: Adequate for Discharge   Problem: Nutrition: Goal: Adequate nutrition will be maintained Outcome: Adequate for Discharge   Problem: Coping: Goal: Level of anxiety will decrease Outcome: Adequate for Discharge   Problem: Elimination: Goal: Will not experience complications related to bowel motility Outcome: Adequate for Discharge Goal: Will not experience complications related to urinary retention Outcome: Adequate for Discharge   Problem: Pain Managment: Goal: General experience of comfort will improve Outcome: Adequate for Discharge   Problem: Safety: Goal: Ability to remain free from injury will improve Outcome: Adequate for Discharge   Problem: Skin Integrity: Goal: Risk for impaired skin integrity will decrease Outcome: Adequate for Discharge   Problem: Acute Rehab PT Goals(only PT should resolve) Goal: Pt Will Go Supine/Side To Sit Outcome: Adequate for Discharge Goal: Pt Will Go Sit To Supine/Side Outcome: Adequate for Discharge Goal: Patient Will Transfer Sit To/From  Stand Outcome: Adequate for Discharge Goal: Pt Will Transfer Bed To Chair/Chair To Bed Outcome: Adequate for Discharge   Problem: Acute Rehab OT Goals (only OT should resolve) Goal: Pt. Will Perform Lower Body Bathing Outcome: Adequate for Discharge Goal: Pt. Will Perform Lower Body Dressing Outcome: Adequate for Discharge Goal: Pt. Will Transfer To Toilet Outcome: Adequate for Discharge Goal: Pt. Will Perform Toileting-Clothing Manipulation Outcome: Adequate for Discharge Goal: Pt/Caregiver Will Perform Home Exercise Program Outcome: Adequate for Discharge Goal: OT Additional ADL Goal #1 Outcome: Adequate for Discharge

## 2021-07-29 NOTE — Consult Note (Signed)
   [facility] CM Inpatient Consult   07/29/2021  [name] June 11, 1961 [id number]  Follow up:  Embedded Primary Care Provider  Patient already transitioned home when rounding on floor. Patient decided to go home with home health noted per Rehab consult notes.  Plan:  Will refer patient for post hospital follow up for CCM for ongoing chronic care needs.  Charlesetta Shanks, RN BSN CCM [facility]  [phone] business mobile phone Toll free office [phone]  Fax number: [phone] [email or web address]

## 2021-07-29 NOTE — Telephone Encounter (Signed)
This pt is s/p an AKA d/c today with rx for oxycodone. Phram calling because she received an rx for Oxycodone 20 mg # 90 on 07/15/21 and want to know if you still want them to fill? Please advise.

## 2021-08-01 ENCOUNTER — Telehealth: Payer: Self-pay | Admitting: *Deleted

## 2021-08-01 NOTE — Chronic Care Management (AMB) (Signed)
  Care Management   Note  08/01/2021 Name: [name] MRN: [id number] DOB: 1960/09/29  [name] is a 60 y.o. year old female who is a primary care patient of Sharlene Dory, DO. I reached out to [employer] by phone today in response to a referral sent by [name] primary care provider.   [name] was given information about care management services today including:  Care management services include personalized support from designated clinical staff supervised by her physician, including individualized plan of care and coordination with other care providers 24/7 contact phone numbers for assistance for urgent and routine care needs. The patient may stop care management services at any time by phone call to the office staff.  Patient agreed to services and verbal consent obtained.   Follow up plan: Telephone appointment with care management team member scheduled for: 08/09/2021  [name], CCMA Care Guide, Embedded Care Coordination [facility]  Care Management  Direct Dial: [phone]

## 2021-08-01 NOTE — Chronic Care Management (AMB) (Deleted)
  Chronic Care Management   Note  08/01/2021 Name: LATASHA BUCZKOWSKI MRN: [id number] DOB: 04-25-1961  [name] Klauer is a 60 y.o. year old female who is a primary care patient of Shelda Pal, DO. I reached out to [employer] by phone today in response to a referral sent by Ms. [place] PCP.  Ms. Lindvall was given information about Chronic Care Management services today including:  CCM service includes personalized support from designated clinical staff supervised by her physician, including individualized plan of care and coordination with other care providers 24/7 contact phone numbers for assistance for urgent and routine care needs. Service will only be billed when office clinical staff spend 20 minutes or more in a month to coordinate care. Only one practitioner may furnish and bill the service in a calendar month. The patient may stop CCM services at any time (effective at the end of the month) by phone call to the office staff. The patient is responsible for co-pay (up to 20% after annual deductible is met) if co-pay is required by the individual health plan.   Patient agreed to services and verbal consent obtained.   Follow up plan: Telephone appointment with care management team member scheduled for: 08/09/2021  Julian Hy, [place] [facility]  Direct Dial: [phone]

## 2021-08-02 ENCOUNTER — Encounter: Payer: Self-pay | Admitting: Orthopedic Surgery

## 2021-08-02 ENCOUNTER — Telehealth: Payer: Self-pay | Admitting: Orthopedic Surgery

## 2021-08-02 ENCOUNTER — Other Ambulatory Visit: Payer: Self-pay

## 2021-08-02 ENCOUNTER — Ambulatory Visit (INDEPENDENT_AMBULATORY_CARE_PROVIDER_SITE_OTHER): Payer: 59 | Admitting: Orthopedic Surgery

## 2021-08-02 DIAGNOSIS — S78111A Complete traumatic amputation at level between right hip and knee, initial encounter: Secondary | ICD-10-CM

## 2021-08-02 NOTE — Progress Notes (Signed)
Office Visit Note   Patient: [name]           Date of Birth: 1961-08-22           MRN: [id number] Visit Date: 08/02/2021              Requested by: Shelda Pal, [address] [place],  [place] [address] PCP: Shelda Pal, DO  Chief Complaint  Patient presents with   Right Leg - Routine Post Op    Right AKA 07/22/21      HPI: Patient is a 60 year old woman who presents 11 days status post right above-the-knee amputation the wound VAC is removed.  There is minimal drainage.  Assessment & Plan: Visit Diagnoses:  1. Unilateral AKA, right ([place])     Plan: We will start with Dial soap cleansing dry dressing changes daily.  Evaluate in 1 week for removal of the staples and sutures.  Discussed that these may stay in for 2 more weeks.  Recommended continued protein supplements.  Follow-Up Instructions: Return in about 1 week (around 08/09/2021).   Ortho Exam  Patient is alert, oriented, no adenopathy, well-dressed, normal affect, normal respiratory effort. Examination there are thin blisters on the residual limb from the wound VAC sponges.  The incision is healing nicely there is a small amount of serosanguineous drainage there is no wound dehiscence no cellulitis.  The wound VAC functioning well without drainage.  Patient states that this wound VAC dressing did not seem to have as a secure congruent fit as previous wound VAC dressings.  Imaging: No results found.     Labs: Lab Results  Component Value Date   HGBA1C 7.3 (H) 05/27/2021   HGBA1C 7.1 (H) 10/27/2020   HGBA1C 9.0 (H) 01/28/2020   ESRSEDRATE 37 (H) 01/02/2018   CRP 9.8 (H) 01/02/2018   REPTSTATUS 06/23/2021 FINAL 06/21/2021   GRAMSTAIN  01/28/2020    ABUNDANT WBC PRESENT,BOTH PMN AND MONONUCLEAR ABUNDANT GRAM POSITIVE COCCI ABUNDANT GRAM NEGATIVE RODS FEW GRAM POSITIVE RODS    CULT (A) 06/21/2021    >=100,000 COLONIES/mL ESCHERICHIA COLI 30,000 COLONIES/mL  PROTEUS MIRABILIS    LABORGA ESCHERICHIA COLI (A) 06/21/2021   LABORGA PROTEUS MIRABILIS (A) 06/21/2021     Lab Results  Component Value Date   ALBUMIN 2.5 (L) 06/09/2021   ALBUMIN 2.9 (L) 05/28/2021   ALBUMIN 3.9 05/26/2021    Lab Results  Component Value Date   MG 1.7 05/02/2021   MG 2.2 10/20/2018   MG 2.2 10/19/2018   Lab Results  Component Value Date   VD25OH 69.04 07/25/2017    No results found for: PREALBUMIN CBC EXTENDED Latest Ref Rng & Units 07/24/2021 07/23/2021 07/05/2021  WBC 4.0 - 10.5 K/uL 13.5(H) 10.8(H) 8.8  RBC 3.87 - 5.11 MIL/uL 3.27(L) 3.43(L) 3.58(L)  HGB 12.0 - 15.0 g/dL 7.6(L) 7.9(L) 9.0(L)  HCT 36.0 - 46.0 % 26.8(L) 27.5(L) 29.4(L)  PLT 150 - 400 K/uL 330 378 264.0  NEUTROABS 1.7 - 7.7 K/uL - - -  LYMPHSABS 0.7 - 4.0 K/uL - - -     There is no height or weight on file to calculate BMI.  Orders:  No orders of the defined types were placed in this encounter.  No orders of the defined types were placed in this encounter.    Procedures: No procedures performed  Clinical Data: No additional findings.  ROS:  All other systems negative, except as noted in the HPI. Review of Systems  Objective: Vital Signs: There were no vitals taken for this visit.  Specialty Comments:  No specialty comments available.  PMFS History: Patient Active Problem List   Diagnosis Date Noted   Hx of AKA (above knee amputation), right ([place]) 07/22/2021   Hx of BKA, right ([place]) 06/29/2021   Dehiscence of amputation stump (HCC)    Acute blood loss anemia 06/24/2021   Postoperative wound infection 06/24/2021   UTI (urinary tract infection) 06/24/2021   Superficial dehiscence of operation wound 06/24/2021   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 ([place])    Bacteremia due to Enterococcus 05/28/2021   Foot osteomyelitis ([place]) 05/26/2021   Sepsis ([place]) 05/26/2021   Digestive disorder 05/18/2021   Bilateral lower extremity edema  05/02/2021   Statin myopathy 03/04/2021   Aortic stenosis 03/04/2021   Sjogren's syndrome ([place]) 02/11/2020   Acute hematogenous osteomyelitis of right foot ([place]) 01/28/2020   Cutaneous abscess of right foot    Subacute osteomyelitis, right ankle and foot ([place])    Statin intolerance 08/16/2019   Gram-negative bacteremia 10/18/2018   Streptococcal bacteremia 10/18/2018   Ulcer of lower extremity, limited to breakdown of skin ([place]) 10/03/2018   CAD S/P percutaneous coronary angioplasty 04/19/2018   Essential hypertension    Moderate persistent asthma 03/21/2018   NAFLD (nonalcoholic fatty liver disease)    Recurrent cellulitis of lower extremity 01/02/2018   Cellulitis of lower leg 01/02/2018   Chronic diarrhea 05/08/2017   H/O Clostridium difficile infection 05/08/2017   Rectal bleeding 05/08/2017   Hyperbilirubinemia 04/29/2016   Hyponatremia 04/29/2016   Cellulitis of right foot 03/09/2016   Mild persistent asthma 02/15/2016   Allergic rhinitis due to pollen 02/15/2016   Anaphylactic reaction due to food 02/10/2016   Diabetes mellitus type 2 in obese ([place]) 02/10/2016   Gastroesophageal reflux disease without esophagitis 02/10/2016   Atopic eczema 02/10/2016   Cervical nerve root disorder 12/15/2015   Lumbar radiculopathy 12/15/2015   Daytime somnolence 11/26/2015   Venous stasis dermatitis of both lower extremities 02/11/2015   Morbid obesity ([place]) 06/19/2014   Abnormal LFTs 03/26/2014   B-complex deficiency 03/26/2014   Benign essential HTN 03/26/2014   Chronic pain associated with significant psychosocial dysfunction 03/26/2014   Diaphragmatic hernia 03/26/2014   Gastroesophageal reflux disease 03/26/2014   Hammer toe 03/26/2014   H/O neoplasm 03/26/2014   Adaptive colitis 03/26/2014   Diabetic polyneuropathy ([place]) 03/26/2014   Deafness, sensorineural 03/26/2014   Fibromyalgia 01/20/2014   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/20/2014   Hyperlipidemia 11/11/2012    Mixed connective tissue disease ([place]) 11/11/2012   Hypothyroidism 99991111   Uncomplicated asthma 99991111   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[place]) 02/20/2012   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     Eczema    Excessive daytime sleepiness 11/26/2015  Fibromyalgia    Gallstones    GERD (gastroesophageal reflux disease)    Heart murmur    "noted for the 1st time on 04/18/2018"   History of blood transfusion 07/2010   "S/P oophorectomy"   History of gout    History of hiatal hernia 1980s   "gone now" (04/18/2018)   Hyperlipidemia    Hypertension    takes Metoprolol and Enalapril daily   Hypothyroidism    takes Synthroid daily   IBS (irritable bowel syndrome)    Migraine    "nothing in the 2000s" (04/18/2018)   Mixed connective tissue disease ([place])    NAFLD (nonalcoholic fatty liver disease)    Pneumonia    "several times" (04/18/2018)   PVC's (premature ventricular contractions)    noted on event monitor 02/2021   Rheumatoid arthritis ([place])    "hands, elbows, shoulders, probably knees" (04/18/2018)   Scoliosis    Sleep apnea    mild - does not use cpap   Spondylosis    Type II diabetes mellitus ([place])    takes Metformin and Hum R daily (04/18/2018)   Walker as ambulation aid    also uses wheelchair    Family History  Problem Relation Age of Onset   CAD Mother    Hypertension Mother    Heart attack Mother     CAD Father    Heart attack Father    Allergic rhinitis Father    Asthma Father    Hypertension Brother    Hypertension Brother    Pancreatic cancer Paternal Aunt    Breast cancer Paternal Aunt    Lung cancer Paternal Aunt    Asthma Son     Past Surgical History:  Procedure Laterality Date   ABDOMINAL HYSTERECTOMY  06/2005   "w/right ovariy"   AMPUTATION Right 01/28/2020    RIGHT FOURTH AND FIFTH RAY AMPUTATION   AMPUTATION Right 01/28/2020   Procedure: RIGHT FOURTH AND FIFTH RAY AMPUTATION,;  Surgeon: Newt Minion, MD;  Location: [facility];  Service: Orthopedics;  Laterality: Right;   AMPUTATION Right 06/01/2021   Procedure: RIGHT BELOW KNEE AMPUTATION;  Surgeon: Newt Minion, MD;  Location: [facility];  Service: Orthopedics;  Laterality: Right;   AMPUTATION Right 07/22/2021   Procedure: RIGHT ABOVE KNEE AMPUTATION;  Surgeon: Newt Minion, MD;  Location: [facility];  Service: Orthopedics;  Laterality: Right;   APPENDECTOMY     BREAST BIOPSY Bilateral    9 total (04/18/2018)   CORONARY ANGIOPLASTY WITH STENT PLACEMENT  10/01/2015   normal LM, 30% LAD, 85% mid RCA and 95% distal RCA s/p PCI of the mid to distal RCA and now on DAPT with ASA and Ticagrelor.     CORONARY STENT INTERVENTION Right 04/18/2018   Procedure: CORONARY STENT INTERVENTION;  Surgeon: Burnell Blanks, MD;  Location: [facility];  Service: Cardiovascular;  Laterality: Right;   DILATION AND CURETTAGE OF UTERUS     FRACTURE SURGERY     LAPAROSCOPIC CHOLECYSTECTOMY     LEFT HEART CATH AND CORONARY ANGIOGRAPHY N/A 04/18/2018   Procedure: LEFT HEART CATH AND CORONARY ANGIOGRAPHY;  Surgeon: Burnell Blanks, MD;  Location: [facility];  Service: Cardiovascular;  Laterality: N/A;   MUSCLE BIOPSY Left    "leg"   OOPHORECTOMY  07/2010   RADIOLOGY WITH ANESTHESIA N/A 05/25/2016   Procedure: RADIOLOGY WITH ANESTHESIA;  Surgeon: Medication Radiologist, MD;  Location: [facility];  Service: Radiology;  Laterality:  N/A;   STUMP REVISION Right  06/29/2021   Procedure: REVISION RIGHT BELOW KNEE AMPUTATION;  Surgeon: [name], MD;  Location: [facility] OR;  Service: Orthopedics;  Laterality: Right;   TEE WITHOUT CARDIOVERSION N/A 06/01/2021   Procedure: TRANSESOPHAGEAL ECHOCARDIOGRAM (TEE);  Surgeon: Sande Rives, MD;  Location: [facility] OR;  Service: Cardiovascular;  Laterality: N/A;   TONSILLECTOMY AND ADENOIDECTOMY     WRIST FRACTURE SURGERY Left    "crushed it"   Social History   Occupational History   Not on file  Tobacco Use   Smoking status: Former    Packs/day: 1.00    Years: 19.00    Pack years: 19.00    Types: Cigarettes    Start date: 38    Quit date: 02/11/2004    Years since quitting: 17.4   Smokeless tobacco: Never  Vaping Use   Vaping Use: Never used  Substance and Sexual Activity   Alcohol use: Yes    Comment: RARE Wine   Drug use: Not Currently    Types: Marijuana    Comment: "only in my teens"   Sexual activity: Yes    Birth control/protection: Surgical    Comment: Hysterectomy

## 2021-08-02 NOTE — Telephone Encounter (Signed)
Victorino Dike (physical therapist) called from [facility] for verbal orders starting today's date 08/02/21 for home health pt for 1 wk 5. Please call Victorino Dike at [phone]. If unable to reach her leave detailed message on secure vm.

## 2021-08-03 ENCOUNTER — Other Ambulatory Visit: Payer: Self-pay | Admitting: Family Medicine

## 2021-08-03 ENCOUNTER — Encounter: Payer: Self-pay | Admitting: Family Medicine

## 2021-08-03 ENCOUNTER — Telehealth: Payer: Self-pay

## 2021-08-03 MED ORDER — OSELTAMIVIR PHOSPHATE 75 MG PO CAPS: 75.0000 mg | ORAL_CAPSULE | Freq: Two times a day (BID) | ORAL | 0 refills | Status: AC

## 2021-08-03 NOTE — Telephone Encounter (Signed)
IC received answering service. They will page nurse Nicholos Johns to ask her to call back so they can be advised of note below.

## 2021-08-03 NOTE — Telephone Encounter (Signed)
[facility] would like an order for a [facility] skilled nurse evaluation for wound care.  Stated that if no order, then she would need the okay from Dr. Lajoyce Corners to take orders from patient's PCP.  CB# [id number].  Please advise.  Thank you.

## 2021-08-03 NOTE — Telephone Encounter (Signed)
The Lidocaine 5% patches have been denied by insurance.

## 2021-08-03 NOTE — Telephone Encounter (Signed)
Lm on Secure VM of verbal approval.

## 2021-08-08 ENCOUNTER — Telehealth: Payer: Self-pay

## 2021-08-08 NOTE — Telephone Encounter (Signed)
Nurse with [facility] called concerning nurse evaluation for wound care.  Advised nurse of Dr. Audrie Lia message.  Voiced that she understands.

## 2021-08-09 ENCOUNTER — Ambulatory Visit: Payer: 59

## 2021-08-09 DIAGNOSIS — Z89511 Acquired absence of right leg below knee: Secondary | ICD-10-CM

## 2021-08-09 DIAGNOSIS — E1169 Type 2 diabetes mellitus with other specified complication: Secondary | ICD-10-CM

## 2021-08-09 NOTE — Chronic Care Management (AMB) (Signed)
Care Management    RN Visit Note  08/09/2021 Name: [name] MRN: [id number] DOB: 1961/07/26  Subjective: [name] is a 60 y.o. year old female who is a primary care patient of Shelda Pal, DO. The care management team was consulted for assistance with disease management and care coordination needs.    Engaged with patient by telephone for initial visit in response to provider referral for case management and/or care coordination services.   Consent to Services:   [name] was given information about Care Management services today including:  Care Management services includes personalized support from designated clinical staff supervised by her physician, including individualized plan of care and coordination with other care providers 24/7 contact phone numbers for assistance for urgent and routine care needs. The patient may stop case management services at any time by phone call to the office staff.  Patient agreed to services and consent obtained.   Assessment: Review of patient past medical history, allergies, medications, health status, including review of consultants reports, laboratory and other test data, was performed as part of comprehensive evaluation and provision of chronic care management services.   SDOH (Social Determinants of Health) assessments and interventions performed:  SDOH Interventions    Flowsheet Row Most Recent Value  SDOH Interventions   Food Insecurity Interventions Other (Comment)  [referral to care guide for food resources]  Transportation Interventions Other (Comment)  [referral to care guide]        Care Plan  Allergies  Allergen Reactions   Fish Allergy Anaphylaxis    INCLUDES OMEGA 3 OILS   Fish Oil Anaphylaxis and Swelling    THROAT SWELLS INCLUDES FISH AS A CLASS   Omega-3 Fatty Acids Swelling    Throat swelling  Has tolerated Glucerna nutritional drinks    Crestor [Rosuvastatin] Other (See Comments)     Extreme joint pain, to this & other statins   Gabapentin Anxiety    Anxiety on high doses   Lovastatin Other (See Comments)    EXTREME JOINT PAIN   Metronidazole Nausea Only and Swelling    Headache and shakes Nausea and vomiting   Red Yeast Rice [Cholestin] Other (See Comments)    Muscle pain and severe joint pain.    Rotigotine Swelling    (Neupro) Extreme edema    Atrovent Hfa [Ipratropium Bromide Hfa] Other (See Comments)    wheezing   Iodine Swelling   Other Other (See Comments)    Surgical Staples causes redness/infection per patient.   Pepto-Bismol [Bismuth Subsalicylate] Nausea And Vomiting    Extreme vomiting   Red Yeast Rice Extract     Joint pain   Victoza [Liraglutide]     Unsure of reaction    Enalapril Cough   Suprep [Na Sulfate-K Sulfate-Mg Sulf] Nausea And Vomiting   Tape Other (See Comments)    Electrodes causes skin breakdown    Outpatient Encounter Medications as of 08/09/2021  Medication Sig Note   Acetylcysteine, Nutrient, (N-ACETYL CYSTEINE) 600 MG TABS Take 600 mg by mouth in the morning and at bedtime.    albuterol (PROVENTIL) (2.5 MG/3ML) 0.083% nebulizer solution USE 1 VIAL IN NEBULIZER EVERY 4 HOURS AS NEEDED FOR WHEEZING OR  FOR  SHORTNESS  OF  BREATH (Patient taking differently: Take 2.5 mg by nebulization every 4 (four) hours as needed.)    albuterol (VENTOLIN HFA) 108 (90 Base) MCG/ACT inhaler INHALE 2 PUFFS BY MOUTH EVERY 4 HOURS AS NEEDED FOR WHEEZING OR  SHORTNESS  OF  BREATH    ascorbic acid (VITAMIN C) 500 MG tablet Take 1 tablet (500 mg total) by mouth 2 (two) times daily.    aspirin EC 81 MG tablet Take 81 mg by mouth every evening.    betamethasone dipropionate 0.05 % cream Apply 1 application topically 2 (two) times daily as needed (extreme itching).    cyclobenzaprine (FLEXERIL) 10 MG tablet Take 1 tablet (10 mg total) by mouth 3 (three) times daily as needed for muscle spasms.    diclofenac Sodium (VOLTAREN) 1 % GEL Apply 2 g topically  4 (four) times daily. 08/09/2021: Reports using as needed.   diltiazem (CARDIZEM CD) 240 MG 24 hr capsule Take 1 capsule (240 mg total) by mouth daily. (Patient taking differently: Take 240 mg by mouth at bedtime.)    diphenhydrAMINE (BENADRYL) 25 MG tablet Take 25 mg by mouth daily as needed for allergies or itching.    EPINEPHrine (EPIPEN 2-PAK) 0.3 mg/0.3 mL IJ SOAJ injection USE AS DIRECTED FOR SEVERE ALLERGIC REACTION. (Patient taking differently: Inject 0.3 mg into the muscle as needed for anaphylaxis. Use as directed for severe allergic reaction)    fluticasone (FLONASE) 50 MCG/ACT nasal spray USE 2 SPRAY(S) IN EACH NOSTRIL ONCE DAILY AS NEEDED FOR  ALLERGIES  OR  RHINITIS (Patient taking differently: Place 2 sprays into both nostrils daily as needed for rhinitis or allergies.)    folic acid (FOLVITE) [id number] MCG tablet Take 1,600 mcg by mouth daily at 12 noon.    furosemide (LASIX) 20 MG tablet Take 20-40 mg by mouth 3 (three) times daily as needed (fluid retention/swelling). 08/09/2021: Reports takes 1-2 times a day as needed depending on weight > 5 pounds in a day.   gabapentin (NEURONTIN) 400 MG capsule Take 1 capsule by mouth in the morning, 1 capsule at noon, and 2 capsules at bed time.    GLUCOMANNAN PO Take 1,995 mg by mouth in the morning, at noon, and at bedtime.    hydrocortisone cream 1 % Apply 1 application topically 3 (three) times daily as needed for itching (minor skin irritation).    hydroxychloroquine (PLAQUENIL) 200 MG tablet Take 1 tablet (200 mg total) by mouth 2 (two) times daily.    insulin regular human CONCENTRATED (HUMULIN R) 500 UNIT/ML injection Inject 0.08-0.3 mLs (40-150 Units total) into the skin 3 (three) times daily with meals. (Patient taking differently: Inject 20-150 Units into the skin 3 (three) times daily with meals.) 08/09/2021: Reports takes 3-25 units three times a day   isosorbide mononitrate (IMDUR) 30 MG 24 hr tablet Take 1 tablet (30 mg total) by mouth  daily. (Patient taking differently: Take 30 mg by mouth at bedtime.)    levocetirizine (XYZAL) 5 MG tablet TAKE 1 TABLET BY MOUTH ONCE DAILY IN THE EVENING (Patient taking differently: Take 5 mg by mouth every evening.)    levothyroxine (SYNTHROID) 200 MCG tablet TAKE 1 TABLET BY MOUTH ONCE DAILY BEFORE BREAKFAST (Patient taking differently: Take 200 mcg by mouth daily before breakfast.)    lidocaine (LIDODERM) 5 % Place 1 patch onto the skin daily. Remove & Discard patch within 12 hours or as directed by MD 07/21/2021: On hold (patient is awaiting refill authorization)   metFORMIN (GLUCOPHAGE-XR) 500 MG 24 hr tablet Take 2 tablets by mouth twice daily    methocarbamol (ROBAXIN) 750 MG tablet Take 750 mg by mouth every 8 (eight) hours as needed for muscle spasms.    mupirocin ointment (BACTROBAN) 2 % Apply 1 application topically 2 (  two) times daily as needed (wound care).    naloxone (NARCAN) nasal spray 4 mg/0.1 mL Place 1 spray into the nose as needed (opoid overdose).    nitroGLYCERIN (NITROSTAT) 0.4 MG SL tablet DISSOLVE ONE TABLET UNDER THE TONGUE EVERY 5 MINUTES AS NEEDED FOR CHEST PAIN.  DO NOT EXCEED A TOTAL OF 3 DOSES IN 15 MINUTES (Patient taking differently: Place 0.4 mg under the tongue every 5 (five) minutes as needed for chest pain.)    omeprazole (PRILOSEC) 40 MG capsule Take 1 capsule (40 mg total) by mouth daily.    oxyCODONE (ROXICODONE) 5 MG immediate release tablet Take 1 tablet (5 mg total) by mouth every 4 (four) hours as needed for severe pain.    oxymetazoline (AFRIN) 0.05 % nasal spray Place 2 sprays into both nostrils 2 (two) times daily.    Polyethyl Glycol-Propyl Glycol (SYSTANE) 0.4-0.3 % SOLN Place 1-2 drops into both eyes in the morning.    POT BICARB-POT CHLORIDE,25MEQ, 25 MEQ TBEF Take 25 mEq by mouth 3 (three) times daily as needed (with lasix).    pramipexole (MIRAPEX) 1 MG tablet Take 1 tablet (1 mg total) by mouth See admin instructions. Take 1 tablet (1 mg) by  mouth scheduled 3 times daily & take 0.5 tablet (0.5 mg) by mouth twice daily if needed for breakthrough restless leg    pregabalin (LYRICA) 25 MG capsule Take 1 capsule (25 mg total) by mouth 2 (two) times daily.    promethazine (PHENERGAN) 25 MG tablet Take 1 tablet (25 mg total) by mouth every 8 (eight) hours as needed for vomiting or nausea.    saccharomyces boulardii (FLORASTOR) 250 MG capsule Take 250 mg by mouth 3 (three) times daily.    tiZANidine (ZANAFLEX) 2 MG tablet Take 1 tablet (2 mg total) by mouth 3 (three) times daily. (Patient taking differently: Take 2 mg by mouth every 8 (eight) hours as needed for muscle spasms.)    valsartan (DIOVAN) 160 MG tablet Take 1 tablet (160 mg total) by mouth daily. (Patient taking differently: Take 160 mg by mouth at bedtime.)    vitamin B-12 (CYANOCOBALAMIN) 1000 MCG tablet Take 1,000 mcg by mouth in the morning.    Vitamin D, Ergocalciferol, (DRISDOL) 1.25 MG (50000 UNIT) CAPS capsule TAKE 1 CAPSULE BY MOUTH ONCE A WEEK (EVERY  7  DAYS)  TAKE  ON  FRIDAY (Patient taking differently: Take 50,000 Units by mouth every Friday. TAKE 1 CAPSULE BY MOUTH ONCE A WEEK (EVERY  7  DAYS)  TAKE  ON  FRIDAY)    Zinc Sulfate 220 (50 Zn) MG TABS Take 1 tablet (220 mg total) by mouth daily. (Patient taking differently: Take 220 mg by mouth at bedtime.)    camphor-menthol (SARNA) lotion Apply topically as needed for itching. (Patient not taking: Reported on 08/09/2021)    cephALEXin (KEFLEX) 500 MG capsule Take 1 capsule (500 mg total) by mouth every 6 (six) hours. (Patient not taking: Reported on 07/21/2021) 06/28/2021: 10 day therapy course patient began on 06/24/21   collagenase (SANTYL) ointment Apply 1 application topically daily. (Patient not taking: Reported on 07/21/2021)    doxycycline (VIBRA-TABS) 100 MG tablet Take 1 tablet (100 mg total) by mouth 2 (two) times daily. (Patient not taking: Reported on 08/09/2021) 07/21/2021: 30 day therapy course patient began on  07/20/21   ezetimibe (ZETIA) 10 MG tablet Take 1 tablet (10 mg total) by mouth daily. (Patient not taking: Reported on 08/09/2021)    fluconazole (DIFLUCAN) 150 MG  tablet Take 150 mg by mouth every 3 (three) days. (Patient not taking: Reported on 08/09/2021) 08/09/2021: Reports takes as needed.   glucose blood test strip 1 each by Other route 4 (four) times daily. Contour Next strips    INSULIN SYRINGE 1CC/29G (B-D INSULIN SYRINGE) 29G X 1/2" 1 ML MISC Use three times daily with insulin.  DX E11.9    [DISCONTINUED] diltiazem (DILT-XR) 240 MG 24 hr capsule Take 1 capsule (240 mg total) by mouth daily.    No facility-administered encounter medications on file as of 08/09/2021.    Patient Active Problem List   Diagnosis Date Noted   Hx of AKA (above knee amputation), right (HCC) 07/22/2021   Hx of BKA, right (HCC) 06/29/2021   Dehiscence of amputation stump (HCC)    Acute blood loss anemia 06/24/2021   Postoperative wound infection 06/24/2021   UTI (urinary tract infection) 06/24/2021   Superficial dehiscence of operation wound 06/24/2021   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)    Bacteremia due to Enterococcus 05/28/2021   Foot osteomyelitis (HCC) 05/26/2021   Sepsis (HCC) 05/26/2021   Digestive disorder 05/18/2021   Bilateral lower extremity edema 05/02/2021   Statin myopathy 03/04/2021   Aortic stenosis 03/04/2021   Sjogren's syndrome (HCC) 02/11/2020   Acute hematogenous osteomyelitis of right foot (HCC) 01/28/2020   Cutaneous abscess of right foot    Subacute osteomyelitis, right ankle and foot (HCC)    Statin intolerance 08/16/2019   Gram-negative bacteremia 10/18/2018   Streptococcal bacteremia 10/18/2018   Ulcer of lower extremity, limited to breakdown of skin (HCC) 10/03/2018   CAD S/P percutaneous coronary angioplasty 04/19/2018   Essential hypertension    Moderate persistent asthma 03/21/2018   NAFLD (nonalcoholic fatty liver disease)     Recurrent cellulitis of lower extremity 01/02/2018   Cellulitis of lower leg 01/02/2018   Chronic diarrhea 05/08/2017   H/O Clostridium difficile infection 05/08/2017   Rectal bleeding 05/08/2017   Hyperbilirubinemia 04/29/2016   Hyponatremia 04/29/2016   Cellulitis of right foot 03/09/2016   Mild persistent asthma 02/15/2016   Allergic rhinitis due to pollen 02/15/2016   Anaphylactic reaction due to food 02/10/2016   Diabetes mellitus type 2 in obese (HCC) 02/10/2016   Gastroesophageal reflux disease without esophagitis 02/10/2016   Atopic eczema 02/10/2016   Cervical nerve root disorder 12/15/2015   Lumbar radiculopathy 12/15/2015   Daytime somnolence 11/26/2015   Venous stasis dermatitis of both lower extremities 02/11/2015   Morbid obesity (HCC) 06/19/2014   Abnormal LFTs 03/26/2014   B-complex deficiency 03/26/2014   Benign essential HTN 03/26/2014   Chronic pain associated with significant psychosocial dysfunction 03/26/2014   Diaphragmatic hernia 03/26/2014   Gastroesophageal reflux disease 03/26/2014   Hammer toe 03/26/2014   H/O neoplasm 03/26/2014   Adaptive colitis 03/26/2014   Diabetic polyneuropathy (HCC) 03/26/2014   Deafness, sensorineural 03/26/2014   Fibromyalgia 01/20/2014   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/20/2014   Hyperlipidemia 11/11/2012   Mixed connective tissue disease (HCC) 11/11/2012   Hypothyroidism 11/11/2012   Uncomplicated asthma 11/11/2012   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HCC) 02/20/2012   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   Start Date: 08/09/2021  Expected End Date: 11/08/2021  Priority: High  Note:   Current Barriers: Frequent admission: 05/26/21-06/08/21 admitted with Bacteremia, R BKA on 06/01/21; 06/08/21-06/24/21 Rehab; 06/29/21-07/01/21 dehiscence of amputation stump/Revision of R BKA on 06/29/21. ; 07/22/20-07/29/20 R AKA on 07/22/21. She reports she is active with [facility] for physical therapy. She states currently her husband is changing dressing/dry dressing. Blood sugars this morning was 94. She reports blood sugar is usually less than 150. Per [name], her husband assist with taking care of her. He is self employed and sometimes she has missed appointment due to conflict with his work schedule. She states that they have food, but would like local resources in case there is a need. She reports potential for financial constraints due to limited income and limited family/local support. Knowledge Deficits related to plan of care for management of transition of care post right AKA  Care Coordination needs related to Limited social support   RNCM Clinical Goal(s):  Patient will verbalize understanding of plan for management of Right AKA as evidenced by self report take all medications exactly as prescribed and will call provider for medication related questions as evidenced by self report and/or chart notation    work with Education officer, museum to address feelings related to recent AKA related to the management of her health as evidenced by review of EMR and patient or social worker report     work with Data processing manager care guide to address needs related to Transportation and would like [employer] information  as evidenced by patient and/or community resource care guide support    through collaboration with Consulting civil engineer, provider, and care team.   Interventions: 1:1 collaboration with primary care  provider regarding development and update of comprehensive plan of care as evidenced by provider attestation and co-signature Inter-disciplinary care team collaboration (see longitudinal plan of care) Evaluation of current treatment plan related to  self management and patient's adherence to plan as established by provider   Transition of care post Right AKA  (Status:  New goal.)  Long Term Goal Discussed plans with patient for ongoing care management follow up and provided patient with direct contact information for care management team Evaluation of current treatment plan related to recent R AKA and patient's adherence to plan as established by provider Advised patient to contact provider with questions or concerns. Reviewed medications with patient and discussed importance of taking as prescribed. Care Guide referral for transportation resources, financial constraints, food resource information. Social Work referral for mental health: feelings re: her health; recent AKA and financial constraints Pharmacy referral for medication review, polypharmacy Discussed plans with patient for ongoing care management follow up and provided patient with direct contact information for care management team Discussed wound care and encouraged patient to continue to work with home health providers as scheduled.    Patient Goals/[name] Activities: Take medications as prescribed   Attend all scheduled provider appointments Call provider office for new concerns or questions  Work with the social worker to address care coordination needs and will continue to work with the clinical team to address health care and disease management related needs Plan to receive a call from the care guide regarding nutrition resources Plan to receive a call from the pharmacist regarding medication review  Plan: Telephone follow up appointment with care management team member scheduled for:  1-2 months The patient has been  provided with contact information for the care management team and has been advised to call with any health related questions or concerns.   [name] Silversmith, RN, MSN, BSN, CCM Care Management Coordinator [facility] [phone]

## 2021-08-09 NOTE — Patient Instructions (Addendum)
Visit Information  Thank you for taking time to visit with me today. Please don't hesitate to contact me if I can be of assistance to you before our next scheduled telephone appointment.  Following are the goals we discussed today:  Patient Goals/[name] Activities: Take medications as prescribed   Attend all scheduled provider appointments Call provider office for new concerns or questions  Work with the social worker to address care coordination needs and will continue to work with the clinical team to address health care and disease management related needs Plan to receive a call from the care guide regarding nutrition resources Plan to receive a call from the pharmacist regarding medication review  Our next appointment is by telephone on 09/06/21 at 2:00 pm  Please call the care guide team at [phone] if you need to cancel or reschedule your appointment.   If you are experiencing a Mental Health or Behavioral Health Crisis or need someone to talk to, please call the Suicide and Crisis Lifeline: 988 call 1-800-273-TALK (toll free, 24 hour hotline)   Following is a copy of your full plan of care:  Care Plan : RN Care Manager Plan of Care  Updates made by Colletta Maryland, RN since 08/09/2021 12:00 AM     Problem: No Plan of Care Established disease managment/transition of care post Right AKA   Priority: High     Long-Range Goal: Development of plan of care for disease managment and/or care coordination needs.   Start Date: 08/09/2021  Expected End Date: 11/08/2021  Priority: High  Note:   Current Barriers: referral post right AKA. Ms. Paulson, admitted 06/29/21-07/01/21 with wound dehiscence of Right BKA. She reports she is active with [facility] for physical therapy. She reports plans in progress for home health nursing to be added. She states currently her husband is changing dressing/dry dressing. Blood sugars this morning was 94. She reports blood sugar is  usually less than 150. Per Mrs. Bord, her husband assist with taking care of her. He is self employed and sometimes has missed appointment due to conflict with his work and her appointment. She states that they have food, but would like local resources in case there is a need. She reports potential for financial constraints due to limited income and limited family/local support. Knowledge Deficits related to plan of care for management of transition of care post right AKA  Care Coordination needs related to Limited social support   RNCM Clinical Goal(s):  Patient will verbalize understanding of plan for management of Right AKA as evidenced by self report take all medications exactly as prescribed and will call provider for medication related questions as evidenced by self report and/or chart notation    work with Child psychotherapist to address feelings related to recent AKA related to the management of her health as evidenced by review of EMR and patient or social worker report     work with Publishing rights manager care guide to address needs related to Transportation and would like [employer] information  as evidenced by patient and/or community resource care guide support    through collaboration with Medical illustrator, provider, and care team.   Interventions: 1:1 collaboration with primary care provider regarding development and update of comprehensive plan of care as evidenced by provider attestation and co-signature Inter-disciplinary care team collaboration (see longitudinal plan of care) Evaluation of current treatment plan related to  self management and patient's adherence to plan as established by provider   Transition  of care post Right AKA  (Status:  New goal.)  Long Term Goal Discussed plans with patient for ongoing care management follow up and provided patient with direct contact information for care management team Evaluation of current treatment plan related to recent R AKA and  patient's adherence to plan as established by provider Advised patient to contact provider with questions or concerns. Reviewed medications with patient and discussed importance of taking as prescribed. Care Guide referral for transportation resources, financial constraints, food resource information. Social Work referral for mental health: feelings re: her health; recent AKA and financial constraints Pharmacy referral for medication review, polypharmacy Discussed plans with patient for ongoing care management follow up and provided patient with direct contact information for care management team Discussed wound care and encouraged patient to continue to work with home health providers as scheduled.    Patient Goals/[name] Activities: Take medications as prescribed   Attend all scheduled provider appointments Call provider office for new concerns or questions  Work with the social worker to address care coordination needs and will continue to work with the clinical team to address health care and disease management related needs Contact your surgeon for any signs of wound infections, redness at site, increase swelling, drainage from the site.       Ms. Hapke was given information about Care Management services by the embedded care coordination team including:  Care Management services include personalized support from designated clinical staff supervised by her physician, including individualized plan of care and coordination with other care providers 24/7 contact phone numbers for assistance for urgent and routine care needs. The patient may stop CCM services at any time (effective at the end of the month) by phone call to the office staff.  Patient agreed to services and verbal consent obtained.   Patient verbalizes understanding of instructions provided today and agrees to view in MyChart.   The patient has been provided with contact information for the care management team and  has been advised to call with any health related questions or concerns.   Kathyrn Sheriff, RN, MSN, BSN, CCM Care Management Coordinator [facility] [phone]

## 2021-08-10 ENCOUNTER — Ambulatory Visit: Payer: 59 | Admitting: Family

## 2021-08-11 ENCOUNTER — Telehealth: Payer: Self-pay | Admitting: *Deleted

## 2021-08-11 NOTE — Telephone Encounter (Signed)
Transition Care Management Follow-up Telephone Call Date of discharge and from where: [facility]  07/29/2021 How have you been since you were released from the hospital? "Getting around better but I have the flu now" Any questions or concerns? No  Items Reviewed: Did the pt receive and understand the discharge instructions provided? Yes  Medications obtained and verified? Yes  Other? No  Any new allergies since your discharge? No  Dietary orders reviewed? Yes Do you have support at home? Yes   Home Care and Equipment/Supplies: Were home health services ordered? yes If so, what is the name of the agency?  [facility] Has the agency set up a time to come to the patient's home? yes Were any new equipment or medical supplies ordered?  No What is the name of the medical supply agency? N/a Were you able to get the supplies/equipment? no Do you have any questions related to the use of the equipment or supplies? N/a  Functional Questionnaire: (I = Independent and D = Dependent) ADLs: D  Bathing/Dressing- D  Meal Prep- D  Eating- I  Maintaining continence- I  Transferring/Ambulation- D  Managing Meds- I  Follow up appointments reviewed:  PCP Hospital f/u appt confirmed? No    Pt saw specialist on 11/22 Dr. Clementeen Hoof f/u appt confirmed? Yes  Scheduled to see Dr. Lajoyce Corners on 08/15/21 @ 1030 am. Are transportation arrangements needed? No  If their condition worsens, is the pt aware to call PCP or go to the Emergency Dept.? Yes Was the patient provided with contact information for the PCP's office or ED? Yes Was to pt encouraged to call back with questions or concerns? Yes   Irving Shows [facility], BSN RN Case Manager [phone]

## 2021-08-11 NOTE — Chronic Care Management (AMB) (Signed)
  Care Management   Note  08/11/2021 Name: [name] MRN: [id number] DOB: [birth year]  [name] is a 59 y.o. year old female who is a primary care patient of Sharlene Dory, DO and is actively engaged with the care management team. I reached out to [facility] by phone today to assist with scheduling an initial visit with the Pharmacist  Follow up plan: Telephone appointment with care management team member scheduled for: 08/17/2021  [name], CCMA Care Guide, Embedded Care Coordination [facility]  Care Management  Direct Dial: [phone]

## 2021-08-12 ENCOUNTER — Telehealth: Payer: Self-pay | Admitting: *Deleted

## 2021-08-12 NOTE — Chronic Care Management (AMB) (Signed)
  Chronic Care Management   Note  08/12/2021 Name: GLENDENE WYER MRN: [id number] DOB: 31-Aug-1961  Miachel Roux Salvo is a 60 y.o. year old female who is a primary care patient of Sharlene Dory, DO. Zayanna Judie Petit Marrin is currently enrolled in care management services. An additional referral for Licensed Clinical SW was placed.   Follow up plan: Telephone appointment with care management team member scheduled for: 08/25/2021   [name], CCMA Care Guide, Embedded Care Coordination [facility]  Care Management  Direct Dial: [phone]

## 2021-08-15 ENCOUNTER — Ambulatory Visit: Payer: 59 | Admitting: Orthopedic Surgery

## 2021-08-17 ENCOUNTER — Telehealth: Payer: 59

## 2021-08-17 ENCOUNTER — Telehealth: Payer: Self-pay | Admitting: Pharmacist

## 2021-08-17 NOTE — Chronic Care Management (AMB) (Signed)
Chronic Care Management Pharmacy Assistant   Name: KAYLY KRIEGEL  MRN: [id number] DOB: 1961-05-20  [name] is an 60 y.o. year old female who presents for his initial CCM visit with the clinical pharmacist.  Recent office visits:  07/05/21- Sharlene Dory, DO(PCP) . Hospital follow up. Labs ordered. 05/18/21-Nicholas Carren Rang, DO (PCP) Seen for back pain. Ambulatory referral to Gastroenterology. Ambulatory referral to Ophthalmology. 05/02/21-Nicholas Carren Rang, DO (PCP) General follow up visit. Labs ordered. Follow up in 3.5 months.  03/01/21-Nicholas Carren Rang, DO (PCP, video visit) Follow up visit for diabetes. Follow up in 3-6 months.  Recent consult visits:  08/02/21 (Orthopedics) [name], MD. Seen for right leg routine post op. Follow up in 1 week. 07/19/21  (Orthopedics) [name], MD. 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 Follow up in 1 week. 07/15/21 (Physical medicine and rehabilitation) Krutika P. Carlis Abbott, MD. Hospital follow up. Follow up in 4 months. 07/15/21 (Orthopedics) [name], MD. Seen for  status post right below-knee amputation. Follow up in 4 days. 07/06/21 (Orthopedic Surgery) Adonis Huguenin, NP.  Seen status post revision right below-knee amputation 1 week ago. Follow up in 6 days. 05/26/21 (Internal medicine) Camelia Phenes, DO. Wound care. 05/24/21 (Internal medicine) Camelia Phenes, DO. Wound care. 05/23/21 (Internal medicine) Camelia Phenes, DO. Wound care. 05/19/21 (Internal medicine) Camelia Phenes, DO. Wound care. 05/13/21 (Rheumatology) Lenna Sciara, MD. Seen for mixed connective tissue diease follow up. 03/15/21 (Orthopedics) Adonis Huguenin, NP. Seen for right foot follow up. 03/04/21 (Cardiology) Megan E. Supple, RPH-CPP. Seen for hyperlipidemia. 03/01/21 (Orthopedic Surgery) Adonis Huguenin, NP. Follow-up for ulceration to her right foot with  cellulitis. 02/22/21 (Orthopedics) [name], MD. Seen for right foot pain. Follow up in 1 week.  Hospital visits:  Admitted to the hospital on 05/26/21 due to wound check. Discharge date was 06/08/21. Discharged from [facility].    New?Medications Started at [facility] Discharge:?? -started none  noted  Medication Changes at Hospital Discharge: -Changed none noted  Medications Discontinued at Hospital Discharge: -Stopped none noted  Medications that remain the same after Hospital Discharge:??  -All other medications will remain the same.    Medications: Outpatient Encounter Medications as of 08/17/2021  Medication Sig Note   Acetylcysteine, Nutrient, (N-ACETYL CYSTEINE) 600 MG TABS Take 600 mg by mouth in the morning and at bedtime.    albuterol (PROVENTIL) (2.5 MG/3ML) 0.083% nebulizer solution USE 1 VIAL IN NEBULIZER EVERY 4 HOURS AS NEEDED FOR WHEEZING OR  FOR  SHORTNESS  OF  BREATH (Patient taking differently: Take 2.5 mg by nebulization every 4 (four) hours as needed.)    albuterol (VENTOLIN HFA) 108 (90 Base) MCG/ACT inhaler INHALE 2 PUFFS BY MOUTH EVERY 4 HOURS AS NEEDED FOR WHEEZING OR  SHORTNESS  OF  BREATH    ascorbic acid (VITAMIN C) 500 MG tablet Take 1 tablet (500 mg total) by mouth 2 (two) times daily.    aspirin EC 81 MG tablet Take 81 mg by mouth every evening.    betamethasone dipropionate 0.05 % cream Apply 1 application topically 2 (two) times daily as needed (extreme itching).    camphor-menthol (SARNA) lotion Apply topically as needed for itching. (Patient not taking: Reported on 08/09/2021)    cephALEXin (KEFLEX) 500 MG capsule Take 1 capsule (500 mg total) by mouth every 6 (six) hours. (Patient not taking: Reported on 07/21/2021) 06/28/2021: 10 day therapy  course patient began on 06/24/21   collagenase (SANTYL) ointment Apply 1 application topically daily. (Patient not taking: Reported on 07/21/2021)    cyclobenzaprine (FLEXERIL) 10 MG tablet Take 1  tablet (10 mg total) by mouth 3 (three) times daily as needed for muscle spasms.    diclofenac Sodium (VOLTAREN) 1 % GEL Apply 2 g topically 4 (four) times daily. 08/09/2021: Reports using as needed.   diltiazem (CARDIZEM CD) 240 MG 24 hr capsule Take 1 capsule (240 mg total) by mouth daily. (Patient taking differently: Take 240 mg by mouth at bedtime.)    diphenhydrAMINE (BENADRYL) 25 MG tablet Take 25 mg by mouth daily as needed for allergies or itching.    doxycycline (VIBRA-TABS) 100 MG tablet Take 1 tablet (100 mg total) by mouth 2 (two) times daily. (Patient not taking: Reported on 08/09/2021) 07/21/2021: 30 day therapy course patient began on 07/20/21   EPINEPHrine (EPIPEN 2-PAK) 0.3 mg/0.3 mL IJ SOAJ injection USE AS DIRECTED FOR SEVERE ALLERGIC REACTION. (Patient taking differently: Inject 0.3 mg into the muscle as needed for anaphylaxis. Use as directed for severe allergic reaction)    ezetimibe (ZETIA) 10 MG tablet Take 1 tablet (10 mg total) by mouth daily. (Patient not taking: Reported on 08/09/2021)    fluconazole (DIFLUCAN) 150 MG tablet Take 150 mg by mouth every 3 (three) days. (Patient not taking: Reported on 08/09/2021) 08/09/2021: Reports takes as needed.   fluticasone (FLONASE) 50 MCG/ACT nasal spray USE 2 SPRAY(S) IN EACH NOSTRIL ONCE DAILY AS NEEDED FOR  ALLERGIES  OR  RHINITIS (Patient taking differently: Place 2 sprays into both nostrils daily as needed for rhinitis or allergies.)    folic acid (FOLVITE) 800 MCG tablet Take 1,600 mcg by mouth daily at 12 noon.    furosemide (LASIX) 20 MG tablet Take 20-40 mg by mouth 3 (three) times daily as needed (fluid retention/swelling). 08/09/2021: Reports takes 1-2 times a day as needed depending on weight > 5 pounds in a day.   gabapentin (NEURONTIN) 400 MG capsule Take 1 capsule by mouth in the morning, 1 capsule at noon, and 2 capsules at bed time.    GLUCOMANNAN PO Take 1,995 mg by mouth in the morning, at noon, and at bedtime.     glucose blood test strip 1 each by Other route 4 (four) times daily. Contour Next strips    hydrocortisone cream 1 % Apply 1 application topically 3 (three) times daily as needed for itching (minor skin irritation).    hydroxychloroquine (PLAQUENIL) 200 MG tablet Take 1 tablet (200 mg total) by mouth 2 (two) times daily.    insulin regular human CONCENTRATED (HUMULIN R) 500 UNIT/ML injection Inject 0.08-0.3 mLs (40-150 Units total) into the skin 3 (three) times daily with meals. (Patient taking differently: Inject 20-150 Units into the skin 3 (three) times daily with meals.) 08/09/2021: Reports takes 3-25 units three times a day   INSULIN SYRINGE 1CC/29G (B-D INSULIN SYRINGE) 29G X 1/2" 1 ML MISC Use three times daily with insulin.  DX E11.9    isosorbide mononitrate (IMDUR) 30 MG 24 hr tablet Take 1 tablet (30 mg total) by mouth daily. (Patient taking differently: Take 30 mg by mouth at bedtime.)    levocetirizine (XYZAL) 5 MG tablet TAKE 1 TABLET BY MOUTH ONCE DAILY IN THE EVENING (Patient taking differently: Take 5 mg by mouth every evening.)    levothyroxine (SYNTHROID) 200 MCG tablet TAKE 1 TABLET BY MOUTH ONCE DAILY BEFORE BREAKFAST (Patient taking differently: Take 200 mcg  by mouth daily before breakfast.)    lidocaine (LIDODERM) 5 % Place 1 patch onto the skin daily. Remove & Discard patch within 12 hours or as directed by MD 07/21/2021: On hold (patient is awaiting refill authorization)   metFORMIN (GLUCOPHAGE-XR) 500 MG 24 hr tablet Take 2 tablets by mouth twice daily    methocarbamol (ROBAXIN) 750 MG tablet Take 750 mg by mouth every 8 (eight) hours as needed for muscle spasms.    mupirocin ointment (BACTROBAN) 2 % Apply 1 application topically 2 (two) times daily as needed (wound care).    naloxone (NARCAN) nasal spray 4 mg/0.1 mL Place 1 spray into the nose as needed (opoid overdose).    nitroGLYCERIN (NITROSTAT) 0.4 MG SL tablet DISSOLVE ONE TABLET UNDER THE TONGUE EVERY 5 MINUTES AS  NEEDED FOR CHEST PAIN.  DO NOT EXCEED A TOTAL OF 3 DOSES IN 15 MINUTES (Patient taking differently: Place 0.4 mg under the tongue every 5 (five) minutes as needed for chest pain.)    omeprazole (PRILOSEC) 40 MG capsule Take 1 capsule (40 mg total) by mouth daily.    oxyCODONE (ROXICODONE) 5 MG immediate release tablet Take 1 tablet (5 mg total) by mouth every 4 (four) hours as needed for severe pain.    oxymetazoline (AFRIN) 0.05 % nasal spray Place 2 sprays into both nostrils 2 (two) times daily.    Polyethyl Glycol-Propyl Glycol (SYSTANE) 0.4-0.3 % SOLN Place 1-2 drops into both eyes in the morning.    POT BICARB-POT CHLORIDE,25MEQ, 25 MEQ TBEF Take 25 mEq by mouth 3 (three) times daily as needed (with lasix).    pramipexole (MIRAPEX) 1 MG tablet Take 1 tablet (1 mg total) by mouth See admin instructions. Take 1 tablet (1 mg) by mouth scheduled 3 times daily & take 0.5 tablet (0.5 mg) by mouth twice daily if needed for breakthrough restless leg    pregabalin (LYRICA) 25 MG capsule Take 1 capsule (25 mg total) by mouth 2 (two) times daily.    promethazine (PHENERGAN) 25 MG tablet Take 1 tablet (25 mg total) by mouth every 8 (eight) hours as needed for vomiting or nausea.    saccharomyces boulardii (FLORASTOR) 250 MG capsule Take 250 mg by mouth 3 (three) times daily.    tiZANidine (ZANAFLEX) 2 MG tablet Take 1 tablet (2 mg total) by mouth 3 (three) times daily. (Patient taking differently: Take 2 mg by mouth every 8 (eight) hours as needed for muscle spasms.)    valsartan (DIOVAN) 160 MG tablet Take 1 tablet (160 mg total) by mouth daily. (Patient taking differently: Take 160 mg by mouth at bedtime.)    vitamin B-12 (CYANOCOBALAMIN) 1000 MCG tablet Take 1,000 mcg by mouth in the morning.    Vitamin D, Ergocalciferol, (DRISDOL) 1.25 MG (50000 UNIT) CAPS capsule TAKE 1 CAPSULE BY MOUTH ONCE A WEEK (EVERY  7  DAYS)  TAKE  ON  FRIDAY (Patient taking differently: Take 50,000 Units by mouth every Friday.  TAKE 1 CAPSULE BY MOUTH ONCE A WEEK (EVERY  7  DAYS)  TAKE  ON  FRIDAY)    Zinc Sulfate 220 (50 Zn) MG TABS Take 1 tablet (220 mg total) by mouth daily. (Patient taking differently: Take 220 mg by mouth at bedtime.)    [DISCONTINUED] diltiazem (DILT-XR) 240 MG 24 hr capsule Take 1 capsule (240 mg total) by mouth daily.    No facility-administered encounter medications on file as of 08/17/2021.   Acetylcysteine, Nutrient, (N-ACETYL CYSTEINE) 600 MG TABS Last filled:None  noted Albuterol (PROVENTIL) (2.5 MG/3ML) 0.083% nebulizer solution Last filled:08/02/20 25 DS Albuterol (VENTOLIN HFA) 108 (90 Base) MCG/ACT inhaler Last filled:08/03/21 17 DS Ascorbic acid (VITAMIN C) 500 MG tablet Last filled:06/24/21 30 DS Aspirin EC 81 MG tablet Last filled:None noted Betamethasone dipropionate 0.05 % cream Last filled:None noted Camphor-menthol (SARNA) lotion Last filled:None noted CephALEXin (KEFLEX) 500 MG capsule Last filled:06/24/21 10 DS Collagenase (SANTYL) ointment Last filled:06/24/21 30 DS Cyclobenzaprine (FLEXERIL) 10 MG tablet Last filled:06/24/21 30 DS Diclofenac Sodium (VOLTAREN) 1 % GEL Last filled:06/24/21 28 DS Diltiazem (CARDIZEM CD) 240 MG 24 hr capsule Last filled:02/03/21 90 DS DiphenhydrAMINE (BENADRYL) 25 MG tablet Last filled:None noted Doxycycline (VIBRA-TABS) 100 MG tablet Last filled:07/19/21 30 DS EPINEPHrine (EPIPEN 2-PAK) 0.3 mg/0.3 mL IJ SOAJ injection Last filled:09/08/19 2 DS Ezetimibe (ZETIA) 10 MG tablet Last filled:06/24/21 30 DS Fluconazole (DIFLUCAN) 150 MG tablet Last filled:05/02/21 31 DS Fluticasone (FLONASE) 50 MCG/ACT nasal spray Last filled:04/06/20 30 DS Folic acid (FOLVITE) 800 MCG tablet Last filled:None noted Furosemide (LASIX) 20 MG tablet Last filled:02/04/20 60 DS Gabapentin (NEURONTIN) 400 MG capsule Last filled:07/16/21 30 DS Hydrocortisone cream 1 % Last filled:06/24/21 6 DS Hydroxychloroquine (PLAQUENIL) 200 MG tablet Last filled:04/29/21 30  DS Insulin regular human CONCENTRATED (HUMULIN R) 500 UNIT/ML injection Last filled:04/20/21 23 DS Isosorbide mononitrate (IMDUR) 30 MG 24 hr tablet Last filled:02/02/21 90 DS Levocetirizine (XYZAL) 5 MG tablet Last filled:05/02/21 90 DS Levothyroxine (SYNTHROID) 200 MCG tablet Last filled:05/02/21 90 DS Lidocaine (LIDODERM) 5 % Last filled:None noted MetFORMIN (GLUCOPHAGE-XR) 500 MG 24 hr tablet Last filled:03/28/21 90 DS Methocarbamol (ROBAXIN) 750 MG tablet Last filled:10/25/18 10 DS Mupirocin ointment (BACTROBAN) 2 % Last filled:03/15/21 30 DS Naloxone (NARCAN) nasal spray 4 mg/0.1 mL Last filled:05/20/19 1 DS NitroGLYCERIN (NITROSTAT) 0.4 MG SL tablet Last filled:11/03/20 8 DS Omeprazole (PRILOSEC) 40 MG capsule Last filled:07/05/21 30 DS OxyCODONE (ROXICODONE) 5 MG immediate release tablet Last filled:None noted Oxymetazoline (AFRIN) 0.05 % nasal spray Last filled:None noted Polyethyl Glycol-Propyl Glycol (SYSTANE) 0.4-0.3 % SOLN Last filled:None noted Pramipexole (MIRAPEX) 1 MG tablet Last filled:07/16/21 30 DS Pregabalin (LYRICA) 25 MG capsule Last filled:07/18/21 30 DS Promethazine (PHENERGAN) 25 MG tablet Last filled:07/05/21 30 DS Saccharomyces boulardii (FLORASTOR) 250 MG capsule Last filled:None noted TiZANidine (ZANAFLEX) 2 MG tablet Last filled:06/24/21 30 DS Valsartan (DIOVAN) 160 MG tablet Last filled:01/24/21 90 DS Vitamin B-12 (CYANOCOBALAMIN) 1000 MCG tablet Last filled:None noted   Care Gaps: COVID-19 Vaccine:Never done PAP SMEAR-Modifier:Never done MAMMOGRAM:Never done INFLUENZA VACCINE:Last completed: Jun 30, 2015  Star Rating Drugs: Valsartan 160 mg Last filled:01/24/21 90 DS Metformin 500 mg Last filled:03/28/21 90 DS  Myriam Carolin Coy, RMA Health Concierge

## 2021-08-17 NOTE — Telephone Encounter (Signed)
Called patient for initial Chronic Care Management visit with clinical pharmacist. Patient asked to reschedule appt for next week and she was not feeling well. Appt rescheduled for 08/23/2021 at 9:45am

## 2021-08-18 ENCOUNTER — Other Ambulatory Visit: Payer: Self-pay

## 2021-08-18 ENCOUNTER — Encounter: Payer: 59 | Attending: Registered Nurse | Admitting: Registered Nurse

## 2021-08-18 VITALS — Ht 62.0 in | Wt 291.0 lb

## 2021-08-18 DIAGNOSIS — G8918 Other acute postprocedural pain: Secondary | ICD-10-CM | POA: Insufficient documentation

## 2021-08-18 DIAGNOSIS — E1169 Type 2 diabetes mellitus with other specified complication: Secondary | ICD-10-CM | POA: Insufficient documentation

## 2021-08-18 DIAGNOSIS — E1142 Type 2 diabetes mellitus with diabetic polyneuropathy: Secondary | ICD-10-CM | POA: Insufficient documentation

## 2021-08-18 DIAGNOSIS — G894 Chronic pain syndrome: Secondary | ICD-10-CM | POA: Insufficient documentation

## 2021-08-18 DIAGNOSIS — Z89611 Acquired absence of right leg above knee: Secondary | ICD-10-CM | POA: Diagnosis not present

## 2021-08-18 DIAGNOSIS — E669 Obesity, unspecified: Secondary | ICD-10-CM | POA: Insufficient documentation

## 2021-08-18 DIAGNOSIS — Z5181 Encounter for therapeutic drug level monitoring: Secondary | ICD-10-CM | POA: Diagnosis not present

## 2021-08-18 MED ORDER — OXYCODONE HCL 20 MG PO TABS
1.0000 | ORAL_TABLET | Freq: Three times a day (TID) | ORAL | 0 refills | Status: DC | PRN
Start: 2021-08-18 — End: 2021-09-27

## 2021-08-18 MED ORDER — PREGABALIN 25 MG PO CAPS: 25.0000 mg | ORAL_CAPSULE | Freq: Two times a day (BID) | ORAL | 0 refills | Status: DC

## 2021-08-18 NOTE — Progress Notes (Signed)
Subjective:    Patient ID: [name], female    DOB: 1961/01/23, 60 y.o.   MRN: [id number]  HPI: [name] is a 60 y.o. female whose appointment was changed to a My-Chart Video visit, she had complaints of flu-like symptoms, she agrees with My-Chart Video Visit and verbalizes understanding. She states her pain is located in her right stump with tingling and burning pain. She rates her pain 6. Her current exercise regime is performing stretching exercises.  [name] Morphine equivalent is 90.00 MME.   Last Oral Swab was Performed on 07/15/2021, it was consistent.    Pain Inventory Average Pain 6 Pain Right Now 6 My pain is constant, sharp, dull, and stabbing  In the last 24 hours, has pain interfered with the following? General activity 8 Relation with others 8 Enjoyment of life 8 What TIME of day is your pain at its worst?  Sleep (in general) Poor  Pain is worse with: some activites Pain improves with: medication Relief from Meds: 6  Family History  Problem Relation Age of Onset   CAD Mother    Hypertension Mother    Heart attack Mother    CAD Father    Heart attack Father    Allergic rhinitis Father    Asthma Father    Hypertension Brother    Hypertension Brother    Pancreatic cancer Paternal Aunt    Breast cancer Paternal Aunt    Lung cancer Paternal Aunt    Asthma Son    Social History   Socioeconomic History   Marital status: Married    Spouse name: Not on file   Number of children: 1   Years of education: Not on file   Highest education level: Not on file  Occupational History   Not on file  Tobacco Use   Smoking status: Former    Packs/day: 1.00    Years: 19.00    Pack years: 19.00    Types: Cigarettes    Start date: 62    Quit date: 02/11/2004    Years since quitting: 17.5   Smokeless tobacco: Never  Vaping Use   Vaping Use: Never used  Substance and Sexual Activity   Alcohol use: Yes    Comment: RARE Wine   Drug use: Not  Currently    Types: Marijuana    Comment: "only in my teens"   Sexual activity: Yes    Birth control/protection: Surgical    Comment: Hysterectomy  Other Topics Concern   Not on file  Social History Narrative   Not on file   Social Determinants of Health   Financial Resource Strain: Not on file  Food Insecurity: Food Insecurity Present   Worried About [place] in the Last Year: Sometimes true   Ran Out of Food in the Last Year: Never true  Transportation Needs: Unmet Transportation Needs   Lack of Transportation (Medical): Yes   Lack of Transportation (Non-Medical): Yes  Physical Activity: Not on file  Stress: Not on file  Social Connections: Not on file   Past Surgical History:  Procedure Laterality Date   ABDOMINAL HYSTERECTOMY  06/2005   "w/right ovariy"   AMPUTATION Right 01/28/2020    RIGHT FOURTH AND FIFTH RAY AMPUTATION   AMPUTATION Right 01/28/2020   Procedure: RIGHT FOURTH AND FIFTH RAY AMPUTATION,;  Surgeon: Newt Minion, MD;  Location: [place];  Service: Orthopedics;  Laterality: Right;   AMPUTATION Right 06/01/2021   Procedure: RIGHT BELOW KNEE  AMPUTATION;  Surgeon: Newt Minion, MD;  Location: [place];  Service: Orthopedics;  Laterality: Right;   AMPUTATION Right 07/22/2021   Procedure: RIGHT ABOVE KNEE AMPUTATION;  Surgeon: Newt Minion, MD;  Location: [place];  Service: Orthopedics;  Laterality: Right;   APPENDECTOMY     BREAST BIOPSY Bilateral    9 total (04/18/2018)   CORONARY ANGIOPLASTY WITH STENT PLACEMENT  10/01/2015   normal LM, 30% LAD, 85% mid RCA and 95% distal RCA s/p PCI of the mid to distal RCA and now on DAPT with ASA and Ticagrelor.     CORONARY STENT INTERVENTION Right 04/18/2018   Procedure: CORONARY STENT INTERVENTION;  Surgeon: Burnell Blanks, MD;  Location: [facility] [place] [facility];  Service: Cardiovascular;  Laterality: Right;   DILATION AND CURETTAGE OF UTERUS     FRACTURE SURGERY     LAPAROSCOPIC CHOLECYSTECTOMY     LEFT  HEART CATH AND CORONARY ANGIOGRAPHY N/A 04/18/2018   Procedure: LEFT HEART CATH AND CORONARY ANGIOGRAPHY;  Surgeon: Burnell Blanks, MD;  Location: [facility];  Service: Cardiovascular;  Laterality: N/A;   MUSCLE BIOPSY Left    "leg"   OOPHORECTOMY  07/2010   RADIOLOGY WITH ANESTHESIA N/A 05/25/2016   Procedure: RADIOLOGY WITH ANESTHESIA;  Surgeon: Medication Radiologist, MD;  Location: [place];  Service: Radiology;  Laterality: N/A;   STUMP REVISION Right 06/29/2021   Procedure: REVISION RIGHT BELOW KNEE AMPUTATION;  Surgeon: Newt Minion, MD;  Location: [place];  Service: Orthopedics;  Laterality: Right;   TEE WITHOUT CARDIOVERSION N/A 06/01/2021   Procedure: TRANSESOPHAGEAL ECHOCARDIOGRAM (TEE);  Surgeon: Geralynn Rile, MD;  Location: [place];  Service: Cardiovascular;  Laterality: N/A;   TONSILLECTOMY AND ADENOIDECTOMY     WRIST FRACTURE SURGERY Left    "crushed it"   Past Surgical History:  Procedure Laterality Date   ABDOMINAL HYSTERECTOMY  06/2005   "w/right ovariy"   AMPUTATION Right 01/28/2020    RIGHT FOURTH AND FIFTH RAY AMPUTATION   AMPUTATION Right 01/28/2020   Procedure: RIGHT FOURTH AND FIFTH RAY AMPUTATION,;  Surgeon: Newt Minion, MD;  Location: [place];  Service: Orthopedics;  Laterality: Right;   AMPUTATION Right 06/01/2021   Procedure: RIGHT BELOW KNEE AMPUTATION;  Surgeon: Newt Minion, MD;  Location: [place];  Service: Orthopedics;  Laterality: Right;   AMPUTATION Right 07/22/2021   Procedure: RIGHT ABOVE KNEE AMPUTATION;  Surgeon: Newt Minion, MD;  Location: [place];  Service: Orthopedics;  Laterality: Right;   APPENDECTOMY     BREAST BIOPSY Bilateral    9 total (04/18/2018)   CORONARY ANGIOPLASTY WITH STENT PLACEMENT  10/01/2015   normal LM, 30% LAD, 85% mid RCA and 95% distal RCA s/p PCI of the mid to distal RCA and now on DAPT with ASA and Ticagrelor.     CORONARY STENT INTERVENTION Right 04/18/2018   Procedure: CORONARY STENT INTERVENTION;   Surgeon: Burnell Blanks, MD;  Location: [place] [facility];  Service: Cardiovascular;  Laterality: Right;   DILATION AND CURETTAGE OF UTERUS     FRACTURE SURGERY     LAPAROSCOPIC CHOLECYSTECTOMY     LEFT HEART CATH AND CORONARY ANGIOGRAPHY N/A 04/18/2018   Procedure: LEFT HEART CATH AND CORONARY ANGIOGRAPHY;  Surgeon: Burnell Blanks, MD;  Location: [place] [facility];  Service: Cardiovascular;  Laterality: N/A;   MUSCLE BIOPSY Left    "leg"   OOPHORECTOMY  07/2010   RADIOLOGY WITH ANESTHESIA N/A 05/25/2016   Procedure: RADIOLOGY  WITH ANESTHESIA;  Surgeon: Medication Radiologist, MD;  Location: [place];  Service: Radiology;  Laterality: N/A;   STUMP REVISION Right 06/29/2021   Procedure: REVISION RIGHT BELOW KNEE AMPUTATION;  Surgeon: Newt Minion, MD;  Location: [place];  Service: Orthopedics;  Laterality: Right;   TEE WITHOUT CARDIOVERSION N/A 06/01/2021   Procedure: TRANSESOPHAGEAL ECHOCARDIOGRAM (TEE);  Surgeon: Geralynn Rile, MD;  Location: [place];  Service: Cardiovascular;  Laterality: N/A;   TONSILLECTOMY AND ADENOIDECTOMY     WRIST FRACTURE SURGERY Left    "crushed it"   Past Medical History:  Diagnosis Date   Aortic stenosis    mild AS by echo 02/2021   Asthma    "on daily RX and rescue inhaler" (04/18/2018)   Chronic diastolic CHF (congestive heart failure) ([place]) 09/2015   Chronic lower back pain    Chronic neck pain    Chronic pain syndrome    Fentanyl Patch   Colon polyps    Coronary artery disease    cath with normal LM, 30% LAD, 85% mid RCA and 95% distal RCA s/p PCI of the mid to distal RCA and now on DAPT with ASA and Ticagrelor.     Eczema    Excessive daytime sleepiness 11/26/2015   Fibromyalgia    Gallstones    GERD (gastroesophageal reflux disease)    Heart murmur    "noted for the 1st time on 04/18/2018"   History of blood transfusion 07/2010   "S/P oophorectomy"   History of gout    History of hiatal hernia 1980s   "gone now" (04/18/2018)    Hyperlipidemia    Hypertension    takes Metoprolol and Enalapril daily   Hypothyroidism    takes Synthroid daily   IBS (irritable bowel syndrome)    Migraine    "nothing in the 2000s" (04/18/2018)   Mixed connective tissue disease ([place])    NAFLD (nonalcoholic fatty liver disease)    Pneumonia    "several times" (04/18/2018)   PVC's (premature ventricular contractions)    noted on event monitor 02/2021   Rheumatoid arthritis ([place])    "hands, elbows, shoulders, probably knees" (04/18/2018)   Scoliosis    Sleep apnea    mild - does not use cpap   Spondylosis    Type II diabetes mellitus ([place])    takes Metformin and Hum R daily (04/18/2018)   Walker as ambulation aid    also uses wheelchair   There were no vitals taken for this visit.  Opioid Risk Score:   Fall Risk Score:  `1  Depression screen PHQ 2/9  Depression screen [facility] 2/9 08/09/2021 07/15/2021 05/02/2021 10/27/2020  Decreased Interest 0 0 0 0  Down, Depressed, Hopeless 1 0 1 0  PHQ - 2 Score 1 0 1 0  Altered sleeping - 3 - -  Tired, decreased energy - 1 - -  Change in appetite - 1 - -  Feeling bad or failure about yourself  - 3 - -  Trouble concentrating - 0 - -  Moving slowly or fidgety/restless - 0 - -  Suicidal thoughts - 0 - -  PHQ-9 Score - 8 - -  Some recent data might be hidden       Review of Systems  Constitutional: Negative.   HENT: Negative.    Eyes: Negative.   Respiratory: Negative.    Cardiovascular: Negative.   Gastrointestinal: Negative.   Musculoskeletal:  Positive for gait problem.       Right leg  Skin: Negative.   Allergic/Immunologic: Negative.   Hematological: Negative.   Psychiatric/Behavioral: Negative.        Objective:   Physical Exam Vitals and nursing note reviewed.  Musculoskeletal:     Comments: No Physical Exam Performed: My Chart Video Visit         Assessment & Plan:  HX: of Right AKA: Dr Lajoyce Corners Following. Continue to Monitor.  Diabetic Polyneuropathy associated  with Type 2 DM: Continue current medication regimen with Lyrica. Continue to Monitor.  DM Type 2: PCP Following. Continue current medication regimen. Continue to Monitor.  Post-Operative Pain: Refilled: Oxycodone 20 mg one tablet every 8 hours as needed for pain #90. We will continue the opioid monitoring program, this consists of regular clinic visits, examinations, urine drug screen, pill counts as well as use of [place] Controlled Substance Reporting system. A 12 month History has been reviewed on the [place] Controlled Substance Reporting System on 08/18/2021  F/U in 1 month

## 2021-08-22 NOTE — Telephone Encounter (Signed)
Still no answer from [facility]. Will keep message open as pt has an appt on 08/25/21, will check with pt and see if she has [facility] nursing.

## 2021-08-23 ENCOUNTER — Other Ambulatory Visit: Payer: Self-pay | Admitting: Family Medicine

## 2021-08-23 ENCOUNTER — Telehealth: Payer: Self-pay | Admitting: Family Medicine

## 2021-08-23 ENCOUNTER — Ambulatory Visit: Payer: 59 | Admitting: Pharmacist

## 2021-08-23 ENCOUNTER — Other Ambulatory Visit: Payer: Self-pay | Admitting: Cardiology

## 2021-08-23 DIAGNOSIS — E781 Pure hyperglyceridemia: Secondary | ICD-10-CM

## 2021-08-23 DIAGNOSIS — M351 Other overlap syndromes: Secondary | ICD-10-CM

## 2021-08-23 DIAGNOSIS — J4540 Moderate persistent asthma, uncomplicated: Secondary | ICD-10-CM

## 2021-08-23 DIAGNOSIS — I10 Essential (primary) hypertension: Secondary | ICD-10-CM

## 2021-08-23 DIAGNOSIS — Z79899 Other long term (current) drug therapy: Secondary | ICD-10-CM

## 2021-08-23 DIAGNOSIS — E1169 Type 2 diabetes mellitus with other specified complication: Secondary | ICD-10-CM

## 2021-08-23 DIAGNOSIS — J45909 Unspecified asthma, uncomplicated: Secondary | ICD-10-CM

## 2021-08-23 DIAGNOSIS — Z789 Other specified health status: Secondary | ICD-10-CM

## 2021-08-23 NOTE — Telephone Encounter (Signed)
[facility] requesting verbal orders for a home health nurse visit once a week for 4 weeks to check vitals.

## 2021-08-23 NOTE — Telephone Encounter (Signed)
HH informed of ok per PCP. °

## 2021-08-23 NOTE — Chronic Care Management (AMB) (Signed)
Chronic Care Management Pharmacy Note  08/23/2021 Name:  [name] MRN:  [id number] DOB:  [birth year]  Summary: Patient has a high deductible [employer] medical plan. Cost of insulin and maintenance inhalers are expensive. Provided patient patient assistance program application for Humulin and Symbicort (not currently taking Symbicort but patient is willing to retry if approved for patient assistance program)  Patient has connective tissue disease but has been holding hydroxychloroquine due to recent surgery for above the knee amputation. She will see Dr Sharol Given later this week and ask if OK to restart hydroxychloroquine.   Subjective: [name] is an 60 y.o. year old female who is a primary patient of Shelda Pal, DO.  The CCM team was consulted for assistance with disease management and care coordination needs.    Engaged with patient by telephone for initial visit in response to provider referral for pharmacy case management and/or care coordination services.   Consent to Services:  The patient was given the following information about Chronic Care Management services today, agreed to services, and gave verbal consent: 1. CCM service includes personalized support from designated clinical staff supervised by the primary care provider, including individualized plan of care and coordination with other care providers 2. 24/7 contact phone numbers for assistance for urgent and routine care needs. 3. Service will only be billed when office clinical staff spend 20 minutes or more in a month to coordinate care. 4. Only one practitioner may furnish and bill the service in a calendar month. 5.The patient may stop CCM services at any time (effective at the end of the month) by phone call to the office staff. 6. The patient will be responsible for cost sharing (co-pay) of up to 20% of the service fee (after annual deductible is met). Patient agreed to services and consent obtained.  Patient  Care Team: Shelda Pal, DO as PCP - General (Family Medicine) Sueanne Margarita, MD as PCP - Cardiology (Cardiology) Luretha Rued, RN as Case Manager Cherre Robins, RPH-CPP (Pharmacist) Francis Gaines, LCSW as [place] Management  Recent office visits: 07/05/2021 - Vonita Moss - started omeperazole; Changed pramipexole to 39m 3 times a day and 0.566mtwice a day if needed for breakthrough restless leg syndrome. stopped esomeprazole 4040m Recent consult visits: 08/18/2021 - Pain Mangement (ThMarcello MooresP) Follow above the knee amputation. decreased dose of oxycodone to 56m8mery 8 hours as needed.  08/03/2021 - Pain Management - phone call. Lidocaine 5% patches denied by insurance.  07/19/2021 - OrSophronia Simasgery - added doxycycline 100mg49m for 30 days 07/15/2021 - Pain Management - increased oxycodone 56mg 68my 8 hours as needed; increased pregabalin 25mg t43m a day  Hospital visits: 07/22/2021 Hospitalization Stop tramadol; Changed Humulin U500 inject 40 to 150 units 3 times a day with meals.  Objective:  Lab Results  Component Value Date   CREATININE 0.88 07/27/2021   CREATININE 1.28 (H) 07/24/2021   CREATININE 0.81 07/23/2021    Lab Results  Component Value Date   HGBA1C 7.3 (H) 05/27/2021   Last diabetic Eye exam: No results found for: HMDIABEYEEXA  Last diabetic Foot exam: No results found for: HMDIABFOOTEX      Component Value Date/Time   CHOL 194 10/27/2020 0809   CHOL 176 05/04/2020 0947   TRIG 223.0 (H) 10/27/2020 0809   HDL 46.60 10/27/2020 0809   HDL 41 05/04/2020 0947   CHOLHDL 4 10/27/2020 0809   VLDL 44.6 (H) 10/27/2020 0809  LDLCALC 100 (H) 05/04/2020 0947   LDLDIRECT 121.0 10/27/2020 0809    Hepatic Function Latest Ref Rng & Units 06/09/2021 05/28/2021 05/26/2021  Total Protein 6.5 - 8.1 g/dL 6.4(L) 6.1(L) 7.8  Albumin 3.5 - 5.0 g/dL 2.5(L) 2.9(L) 3.9  AST 15 - 41 U/L 51(H) 76(H) 42(H)  ALT 0 - 44 U/L 39 36 33  Alk  Phosphatase 38 - 126 U/L 63 37(L) 48  Total Bilirubin 0.3 - 1.2 mg/dL 0.6 1.1 0.7  Bilirubin, Direct 0.0 - 0.2 mg/dL - 0.1 -    Lab Results  Component Value Date/Time   TSH 2.170 05/04/2020 09:47 AM   TSH 0.624 10/20/2018 02:50 AM   TSH 1.388 01/02/2018 06:09 PM   TSH 2.451  Test methodology is 3rd generation TSH  12/06/2008 04:55 AM    CBC Latest Ref Rng & Units 07/24/2021 07/23/2021 07/05/2021  WBC 4.0 - 10.5 K/uL 13.5(H) 10.8(H) 8.8  Hemoglobin 12.0 - 15.0 g/dL 7.6(L) 7.9(L) 9.0(L)  Hematocrit 36.0 - 46.0 % 26.8(L) 27.5(L) 29.4(L)  Platelets 150 - 400 K/uL 330 378 264.0    Lab Results  Component Value Date/Time   VD25OH 69.04 07/25/2017 11:55 AM    Clinical ASCVD: Yes  The 10-year ASCVD risk score (Arnett DK, et al., 2019) is: 9.7%   Values used to calculate the score:     Age: 62 years     Sex: Female     Is Non-Hispanic African American: No     Diabetic: Yes     Tobacco smoker: No     Systolic Blood Pressure: 220 mmHg     Is BP treated: Yes     HDL Cholesterol: 46.6 mg/dL     Total Cholesterol: 194 mg/dL     Social History   Tobacco Use  Smoking Status Former   Packs/day: 1.00   Years: 19.00   Pack years: 19.00   Types: Cigarettes   Start date: 65   Quit date: 02/11/2004   Years since quitting: 17.5  Smokeless Tobacco Never   BP Readings from Last 3 Encounters:  07/29/21 (!) 163/89  07/15/21 128/73  07/05/21 118/62   Pulse Readings from Last 3 Encounters:  07/29/21 84  07/15/21 (!) 103  07/05/21 91   Wt Readings from Last 3 Encounters:  08/18/21 291 lb (132 kg)  07/22/21 291 lb (132 kg)  07/15/21 295 lb (133.8 kg)    Assessment: Review of patient past medical history, allergies, medications, health status, including review of consultants reports, laboratory and other test data, was performed as part of comprehensive evaluation and provision of chronic care management services.   SDOH:  (Social Determinants of Health) assessments and  interventions performed:    CCM Care Plan  Allergies  Allergen Reactions   Fish Allergy Anaphylaxis    INCLUDES OMEGA 3 OILS   Fish Oil Anaphylaxis and Swelling    THROAT SWELLS INCLUDES FISH AS A CLASS   Omega-3 Fatty Acids Swelling    Throat swelling  Has tolerated Glucerna nutritional drinks    Crestor [Rosuvastatin] Other (See Comments)    Extreme joint pain, to this & other statins   Gabapentin Anxiety    Anxiety on high doses   Lovastatin Other (See Comments)    EXTREME JOINT PAIN   Metronidazole Nausea Only and Swelling    Headache and shakes Nausea and vomiting   Red Yeast Rice [Cholestin] Other (See Comments)    Muscle pain and severe joint pain.    Rotigotine Swelling    (  Neupro) Extreme edema    Atrovent Hfa [Ipratropium Bromide Hfa] Other (See Comments)    wheezing   Iodine Swelling   Other Other (See Comments)    Surgical Staples causes redness/infection per patient.   Pepto-Bismol [Bismuth Subsalicylate] Nausea And Vomiting    Extreme vomiting   Victoza [Liraglutide]     Unsure of reaction    Enalapril Cough   Suprep [Na Sulfate-K Sulfate-Mg Sulf] Nausea And Vomiting   Tape Other (See Comments)    Electrodes causes skin breakdown    Medications Reviewed Today     Reviewed by Sharia Reeve, CMA (Certified Medical Assistant) on 08/18/21 at [place] List Status: <None>   Medication Order Taking? Sig Documenting Provider Last Dose Status Informant  Acetylcysteine, Nutrient, (N-ACETYL CYSTEINE) 600 MG TABS [id number] Yes Take 600 mg by mouth in the morning and at bedtime. [provider] Taking Active Self  albuterol (PROVENTIL) (2.5 MG/3ML) 0.083% nebulizer solution [id number] Yes USE 1 VIAL IN NEBULIZER EVERY 4 HOURS AS NEEDED FOR WHEEZING OR  FOR  SHORTNESS  OF  BREATH  Patient taking differently: Take 2.5 mg by nebulization every 4 (four) hours as needed.   Shelda Pal, DO Taking Active   albuterol (VENTOLIN HFA) 108 (90 Base)  MCG/ACT inhaler [id number] Yes INHALE 2 PUFFS BY MOUTH EVERY 4 HOURS AS NEEDED FOR WHEEZING OR  SHORTNESS  OF  BREATH Shelda Pal, DO Taking Active   ascorbic acid (VITAMIN C) 500 MG tablet [id number] Yes Take 1 tablet (500 mg total) by mouth 2 (two) times daily. Bary Leriche, PA-C Taking Active Self  aspirin EC 81 MG tablet [id number] Yes Take 81 mg by mouth every evening. [provider] Taking Active Self  betamethasone dipropionate 0.05 % cream [id number] Yes Apply 1 application topically 2 (two) times daily as needed (extreme itching). [provider] Taking Active Self  camphor-menthol Timoteo Ace) lotion [id number] Yes Apply topically as needed for itching. Bary Leriche, PA-C Taking Active Self  cephALEXin (KEFLEX) 500 MG capsule [id number] Yes Take 1 capsule (500 mg total) by mouth every 6 (six) hours. Bary Leriche, PA-C Taking Active Self           Med Note Sharene Butters   Tue Jun 28, 2021 10:11 AM) 10 day therapy course patient began on 06/24/21  collagenase (SANTYL) ointment [id number] Yes Apply 1 application topically daily. Bary Leriche, PA-C Taking Active Self  cyclobenzaprine (FLEXERIL) 10 MG tablet [id number] Yes Take 1 tablet (10 mg total) by mouth 3 (three) times daily as needed for muscle spasms. Bary Leriche, PA-C Taking Active Self  diclofenac Sodium (VOLTAREN) 1 % GEL [id number] Yes Apply 2 g topically 4 (four) times daily. Bary Leriche, PA-C Taking Active Self           Med Note Luretha Rued   Tue Aug 09, 2021  2:24 PM) Reports using as needed.  diltiazem (CARDIZEM CD) 240 MG 24 hr capsule [id number]  Take 1 capsule (240 mg total) by mouth daily.  Patient taking differently: Take 240 mg by mouth at bedtime.   Bary Leriche, [place]  Active     Discontinued 06/24/21 1029 (Reorder) diphenhydrAMINE (BENADRYL) 25 MG tablet [id number]  Take 25 mg by mouth daily as needed for allergies or itching. [provider]  Active Self  doxycycline  (VIBRA-TABS) 100 MG tablet [id number] No Take 1 tablet (100 mg total) by mouth 2 (two) times daily.  Patient not taking:  Reported on 08/09/2021   Newt Minion, MD Not Taking Active Self           Med Note Liliane Bade Jul 21, 2021 11:30 AM) 30 day therapy course patient began on 07/20/21  EPINEPHrine (EPIPEN 2-PAK) 0.3 mg/0.3 mL IJ SOAJ injection [id number]  USE AS DIRECTED FOR SEVERE ALLERGIC REACTION.  Patient taking differently: Inject 0.3 mg into the muscle as needed for anaphylaxis. Use as directed for severe allergic reaction   Wendling, Crosby Oyster, DO  Active            Med Note Kenton Kingfisher, Kathrine Haddock Jun 28, 2021 10:17 AM)    ezetimibe (ZETIA) 10 MG tablet [id number]  Take 1 tablet (10 mg total) by mouth daily.  Patient not taking: Reported on 08/09/2021   Flora Lipps  Active   fluconazole (DIFLUCAN) 150 MG tablet [id number]  Take 150 mg by mouth every 3 (three) days.  Patient not taking: Reported on 08/09/2021   [provider]  Active Self           Med Note Evelena Peat Aug 09, 2021  2:26 PM) Reports takes as needed.  fluticasone (FLONASE) 50 MCG/ACT nasal spray [id number]  USE 2 SPRAY(S) IN EACH NOSTRIL ONCE DAILY AS NEEDED FOR  ALLERGIES  OR  RHINITIS  Patient taking differently: Place 2 sprays into both nostrils daily as needed for rhinitis or allergies.   Shelda Pal, DO  Active   folic acid (FOLVITE) 633 MCG tablet [id number] Yes Take 1,600 mcg by mouth daily at 12 noon. [provider] Taking Active Self  furosemide (LASIX) 20 MG tablet [id number] Yes Take 20-40 mg by mouth 3 (three) times daily as needed (fluid retention/swelling). [provider] Taking Active Self           Med Note Juleen China, Deno Etienne   Tue Aug 09, 2021  2:29 PM) Reports takes 1-2 times a day as needed depending on weight > 5 pounds in a day.  gabapentin (NEURONTIN) 400 MG capsule [id number] Yes Take 1 capsule by mouth in the morning, 1  capsule at noon, and 2 capsules at bed time. Shelda Pal, DO Taking Active Self  GLUCOMANNAN PO [id number] Yes Take 1,995 mg by mouth in the morning, at noon, and at bedtime. [provider] Taking Active Self  glucose blood test strip [id number] Yes 1 each by Other route 4 (four) times daily. Contour Next strips Bary Leriche, [place] Taking Active Self  hydrocortisone cream 1 % [id number] Yes Apply 1 application topically 3 (three) times daily as needed for itching (minor skin irritation). Bary Leriche, PA-C Taking Active Self  hydroxychloroquine (PLAQUENIL) 200 MG tablet [id number] Yes Take 1 tablet (200 mg total) by mouth 2 (two) times daily. Shelda Pal, DO Taking Active Self  insulin regular human CONCENTRATED (HUMULIN R) 500 UNIT/ML injection [id number] Yes Inject 0.08-0.3 mLs (40-150 Units total) into the skin 3 (three) times daily with meals.  Patient taking differently: Inject 20-150 Units into the skin 3 (three) times daily with meals.   Izora Ribas, MD Taking Active            Med Note Juleen China, Deno Etienne   Tue Aug 09, 2021  2:31 PM) Reports takes 3-25 units three times a day  INSULIN SYRINGE 1CC/29G (B-D INSULIN SYRINGE) 29G X 1/2" 1 ML MISC [id number] Yes Use three times daily with insulin.  DX E11.9 Shelda Pal, DO Taking Active Self  isosorbide mononitrate (IMDUR) 30 MG 24 hr tablet [id number] Yes Take 1 tablet (30 mg total) by mouth daily.  Patient taking differently: Take 30 mg by mouth at bedtime.   Sueanne Margarita, MD Taking Active   levocetirizine (XYZAL) 5 MG tablet [id number] Yes TAKE 1 TABLET BY MOUTH ONCE DAILY IN THE EVENING  Patient taking differently: Take 5 mg by mouth every evening.   Shelda Pal, DO Taking Active   levothyroxine (SYNTHROID) 200 MCG tablet [id number] Yes TAKE 1 TABLET BY MOUTH ONCE DAILY BEFORE BREAKFAST  Patient taking differently: Take 200 mcg by mouth daily before breakfast.   Shelda Pal, DO Taking Active   lidocaine (LIDODERM) 5 % [id number] No Place 1 patch onto the skin daily. Remove & Discard patch within 12 hours or as directed by MD  Patient not taking: Reported on 08/18/2021   Izora Ribas, MD Not Taking Active Self           Med Note Sharyn Lull, JULISSA   Thu Aug 18, 2021 10:05 AM) Pt doesn't have it   metFORMIN (GLUCOPHAGE-XR) 500 MG 24 hr tablet [id number] Yes Take 2 tablets by mouth twice daily Shelda Pal, DO Taking Active Self  methocarbamol (ROBAXIN) 750 MG tablet [id number] Yes Take 750 mg by mouth every 8 (eight) hours as needed for muscle spasms. [provider] Taking Active Self  mupirocin ointment (BACTROBAN) 2 % [id number] Yes Apply 1 application topically 2 (two) times daily as needed (wound care). [provider] Taking Active Self  naloxone [facility]) nasal spray 4 mg/0.1 mL [id number] Yes Place 1 spray into the nose as needed (opoid overdose). [provider] Taking Active Self  nitroGLYCERIN (NITROSTAT) 0.4 MG SL tablet [id number] Yes DISSOLVE ONE TABLET UNDER THE TONGUE EVERY 5 MINUTES AS NEEDED FOR CHEST PAIN.  DO NOT EXCEED A TOTAL OF 3 DOSES IN 15 MINUTES  Patient taking differently: Place 0.4 mg under the tongue every 5 (five) minutes as needed for chest pain.   Sueanne Margarita, MD Taking Active   omeprazole (PRILOSEC) 40 MG capsule [id number] Yes Take 1 capsule (40 mg total) by mouth daily. Shelda Pal, DO Taking Active Self  oxyCODONE (ROXICODONE) 5 MG immediate release tablet [id number] Yes Take 1 tablet (5 mg total) by mouth every 4 (four) hours as needed for severe pain. Newt Minion, MD Taking Active            Med Note Sharia Reeve   Thu Aug 18, 2021 10:01 AM) Ld 08/18/2021 pt had surgery  oxymetazoline (AFRIN) 0.05 % nasal spray [id number] Yes Place 2 sprays into both nostrils 2 (two) times daily. [provider] Taking Active Self  Polyethyl Glycol-Propyl Glycol (SYSTANE) 0.4-0.3  % SOLN [id number] Yes Place 1-2 drops into both eyes in the morning. [provider] Taking Active Self  POT [place], 25 MEQ TBEF [id number] Yes Take 25 mEq by mouth 3 (three) times daily as needed (with lasix). [provider] Taking Active Self  pramipexole (MIRAPEX) 1 MG tablet [id number] Yes Take 1 tablet (1 mg total) by mouth See admin instructions. Take 1 tablet (1 mg) by mouth scheduled 3 times daily & take 0.5 tablet (0.5 mg) by mouth twice daily if needed for breakthrough restless leg Shelda Pal, DO Taking Active Self  pregabalin (LYRICA) 25 MG capsule [id number] Yes Take 1 capsule (25 mg total) by mouth 2 (two) times daily.  Izora Ribas, MD Taking Active Self  promethazine (PHENERGAN) 25 MG tablet [id number] Yes Take 1 tablet (25 mg total) by mouth every 8 (eight) hours as needed for vomiting or nausea. Shelda Pal, DO Taking Active Self  saccharomyces boulardii (FLORASTOR) 250 MG capsule [id number] Yes Take 250 mg by mouth 3 (three) times daily. [provider] Taking Active Self           Med Note Vinie Sill Jun 28, 2021  9:46 AM)    tiZANidine (ZANAFLEX) 2 MG tablet [id number] Yes Take 1 tablet (2 mg total) by mouth 3 (three) times daily.  Patient taking differently: Take 2 mg by mouth every 8 (eight) hours as needed for muscle spasms.   Suzan Slick, NP Taking Active Self  valsartan (DIOVAN) 160 MG tablet [id number] Yes Take 1 tablet (160 mg total) by mouth daily.  Patient taking differently: Take 160 mg by mouth at bedtime.   Sueanne Margarita, MD Taking Active   vitamin B-12 (CYANOCOBALAMIN) 1000 MCG tablet [id number] Yes Take 1,000 mcg by mouth in the morning. [provider] Taking Active Self  Vitamin D, Ergocalciferol, (DRISDOL) 1.25 MG (50000 UNIT) CAPS capsule [id number] Yes TAKE 1 CAPSULE BY MOUTH ONCE A WEEK (EVERY  7  DAYS)  TAKE  ON  FRIDAY  Patient taking differently: Take 50,000 Units  by mouth every Friday. TAKE 1 CAPSULE BY MOUTH ONCE A WEEK (EVERY  7  DAYS)  TAKE  ON  FRIDAY   Shelda Pal, DO Taking Active   Zinc Sulfate 220 (50 Zn) MG TABS [id number] Yes Take 1 tablet (220 mg total) by mouth daily.  Patient taking differently: Take 220 mg by mouth at bedtime.   Bary Leriche, [place] Taking Active             Patient Active Problem List   Diagnosis Date Noted   Hx of AKA (above knee amputation), right ([place]) 07/22/2021   Hx of BKA, right ([place]) 06/29/2021   Dehiscence of amputation stump (H[facility])    Acute blood loss anemia 06/24/2021   Postoperative wound infection 06/24/2021   UTI (urinary tract infection) 06/24/2021   Superficial dehiscence of operation wound 06/24/2021   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 ([place])    Bacteremia due to Enterococcus 05/28/2021   Foot osteomyelitis ([place]) 05/26/2021   Sepsis ([place]) 05/26/2021   Digestive disorder 05/18/2021   Bilateral lower extremity edema 05/02/2021   Statin myopathy 03/04/2021   Aortic stenosis 03/04/2021   Sjogren's syndrome ([place]) 02/11/2020   Acute hematogenous osteomyelitis of right foot ([place]) 01/28/2020   Cutaneous abscess of right foot    Subacute osteomyelitis, right ankle and foot ([place])    Statin intolerance 08/16/2019   Gram-negative bacteremia 10/18/2018   Streptococcal bacteremia 10/18/2018   Ulcer of lower extremity, limited to breakdown of skin ([place]) 10/03/2018   CAD S/P percutaneous coronary angioplasty 04/19/2018   Essential hypertension    Moderate persistent asthma 03/21/2018   NAFLD (nonalcoholic fatty liver disease)    Recurrent cellulitis of lower extremity 01/02/2018   Cellulitis of lower leg 01/02/2018   Chronic diarrhea 05/08/2017   H/O Clostridium difficile infection 05/08/2017   Rectal bleeding 05/08/2017   Hyperbilirubinemia 04/29/2016   Hyponatremia 04/29/2016   Cellulitis of right foot 03/09/2016   Mild persistent asthma 02/15/2016    Allergic rhinitis due to pollen 02/15/2016   Anaphylactic reaction due to food 02/10/2016   Diabetes mellitus type  2 in obese ([place]) 02/10/2016   Gastroesophageal reflux disease without esophagitis 02/10/2016   Atopic eczema 02/10/2016   Cervical nerve root disorder 12/15/2015   Lumbar radiculopathy 12/15/2015   Daytime somnolence 11/26/2015   Venous stasis dermatitis of both lower extremities 02/11/2015   Morbid obesity ([place]) 06/19/2014   Abnormal LFTs 03/26/2014   B-complex deficiency 03/26/2014   Benign essential HTN 03/26/2014   Chronic pain associated with significant psychosocial dysfunction 03/26/2014   Diaphragmatic hernia 03/26/2014   Gastroesophageal reflux disease 03/26/2014   Hammer toe 03/26/2014   H/O neoplasm 03/26/2014   Adaptive colitis 03/26/2014   Diabetic polyneuropathy ([place]) 03/26/2014   Deafness, sensorineural 03/26/2014   Fibromyalgia 01/20/2014   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/20/2014   Hyperlipidemia 11/11/2012   Mixed connective tissue disease ([place]) 11/11/2012   Hypothyroidism 92/49/3241   Uncomplicated asthma 99/14/4458   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[place]) 02/20/2012   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   ANA positive 07/17/2011    Immunization History  Administered Date(s) Administered   Influenza,inj,Quad PF,6+ Mos 06/30/2015   Pneumococcal Polysaccharide-23 02/25/2014    Conditions to be addressed/monitored: CAD, HTN, HLD, DMII, Asthma, Hypothyroidism, and mixed connective tissue disease, chronic pain,   Care Plan : Diabetes; neuropathy; HDL; HTN; CAD;  chronic pain  Updates made by Cherre Robins, RPH-CPP since 08/23/2021 12:00 AM     Problem: Provide education, support and care coordination for medication therapy and chronic conditions       Medication Assistance: Application for Humulin R and possible Symbicort (she will retry if approved for patient  assistance program)   medication assistance program. in process.  Anticipated assistance start date 09/25/2021.  See plan of care for additional detail.  Patient's preferred pharmacy is:  [place], [place] - [address] [place] [address] Phone: [phone] Fax: [phone]   Follow Up:  Patient agrees to Care Plan and Follow-up.  Plan: Telephone follow up appointment with care management team member scheduled for:  09/21/2021  Cherre Robins, PharmD Clinical Pharmacist [facility]

## 2021-08-24 ENCOUNTER — Other Ambulatory Visit: Payer: Self-pay | Admitting: Family Medicine

## 2021-08-24 MED ORDER — FLUCONAZOLE 150 MG PO TABS
150.0000 mg | ORAL_TABLET | ORAL | 1 refills | Status: DC
Start: 2021-08-24 — End: 2022-01-06

## 2021-08-24 MED ORDER — GABAPENTIN 400 MG PO CAPS: 1600.0000 mg | ORAL_CAPSULE | ORAL | 1 refills | Status: DC

## 2021-08-24 MED ORDER — EPINEPHRINE 0.3 MG/0.3ML IJ SOAJ: INTRAMUSCULAR | 0 refills | Status: DC

## 2021-08-25 ENCOUNTER — Encounter: Payer: Self-pay | Admitting: Orthopedic Surgery

## 2021-08-25 ENCOUNTER — Encounter: Payer: Self-pay | Admitting: Registered Nurse

## 2021-08-25 ENCOUNTER — Ambulatory Visit (INDEPENDENT_AMBULATORY_CARE_PROVIDER_SITE_OTHER): Payer: 59 | Admitting: Orthopedic Surgery

## 2021-08-25 ENCOUNTER — Ambulatory Visit: Payer: 59 | Admitting: *Deleted

## 2021-08-25 DIAGNOSIS — M545 Low back pain, unspecified: Secondary | ICD-10-CM

## 2021-08-25 DIAGNOSIS — R600 Localized edema: Secondary | ICD-10-CM

## 2021-08-25 DIAGNOSIS — K219 Gastro-esophageal reflux disease without esophagitis: Secondary | ICD-10-CM

## 2021-08-25 DIAGNOSIS — M5412 Radiculopathy, cervical region: Secondary | ICD-10-CM

## 2021-08-25 DIAGNOSIS — M797 Fibromyalgia: Secondary | ICD-10-CM

## 2021-08-25 DIAGNOSIS — G894 Chronic pain syndrome: Secondary | ICD-10-CM

## 2021-08-25 DIAGNOSIS — E1169 Type 2 diabetes mellitus with other specified complication: Secondary | ICD-10-CM

## 2021-08-25 DIAGNOSIS — Z79899 Other long term (current) drug therapy: Secondary | ICD-10-CM

## 2021-08-25 DIAGNOSIS — Z89611 Acquired absence of right leg above knee: Secondary | ICD-10-CM

## 2021-08-25 DIAGNOSIS — S78111A Complete traumatic amputation at level between right hip and knee, initial encounter: Secondary | ICD-10-CM

## 2021-08-25 DIAGNOSIS — Z89511 Acquired absence of right leg below knee: Secondary | ICD-10-CM

## 2021-08-25 NOTE — Progress Notes (Signed)
Office Visit Note   Patient: [name]           Date of Birth: Sep 23, 1960           MRN: [id number] Visit Date: 08/25/2021              Requested by: Sharlene Dory, DO [address] [place],  [place] [address] PCP: Sharlene Dory, DO  Chief Complaint  Patient presents with   Right Leg - Routine Post Op    Right AKA 07/22/21      HPI: Patient is a 59 year old woman status post right above-the-knee amputation she has no complaints.  She states she feels like she has some muscle spasms occasionally.  Assessment & Plan: Visit Diagnoses:  1. Unilateral AKA, right (HCC)     Plan: Continue with the protein supplements.  Staples and sutures harvested today.  At follow-up will make an appointment with [facility] for prosthetic evaluation.  Follow-Up Instructions: Return in about 4 weeks (around 09/22/2021).   Ortho Exam  Patient is alert, oriented, no adenopathy, well-dressed, normal affect, normal respiratory effort. Examination the incision is well-healed there is no redness no cellulitis no drainage no signs of infection.  Stitches and staples harvested today.  Imaging: No results found.   Labs: Lab Results  Component Value Date   HGBA1C 7.3 (H) 05/27/2021   HGBA1C 7.1 (H) 10/27/2020   HGBA1C 9.0 (H) 01/28/2020   ESRSEDRATE 37 (H) 01/02/2018   CRP 9.8 (H) 01/02/2018   REPTSTATUS 06/23/2021 FINAL 06/21/2021   GRAMSTAIN  01/28/2020    ABUNDANT WBC PRESENT,BOTH PMN AND MONONUCLEAR ABUNDANT GRAM POSITIVE COCCI ABUNDANT GRAM NEGATIVE RODS FEW GRAM POSITIVE RODS    CULT (A) 06/21/2021    >=100,000 COLONIES/mL ESCHERICHIA COLI 30,000 COLONIES/mL PROTEUS MIRABILIS    LABORGA ESCHERICHIA COLI (A) 06/21/2021   LABORGA PROTEUS MIRABILIS (A) 06/21/2021     Lab Results  Component Value Date   ALBUMIN 2.5 (L) 06/09/2021   ALBUMIN 2.9 (L) 05/28/2021   ALBUMIN 3.9 05/26/2021    Lab Results  Component Value Date   MG 1.7 05/02/2021    MG 2.2 10/20/2018   MG 2.2 10/19/2018   Lab Results  Component Value Date   VD25OH 69.04 07/25/2017    No results found for: PREALBUMIN CBC EXTENDED Latest Ref Rng & Units 07/24/2021 07/23/2021 07/05/2021  WBC 4.0 - 10.5 K/uL 13.5(H) 10.8(H) 8.8  RBC 3.87 - 5.11 MIL/uL 3.27(L) 3.43(L) 3.58(L)  HGB 12.0 - 15.0 g/dL 7.6(L) 7.9(L) 9.0(L)  HCT 36.0 - 46.0 % 26.8(L) 27.5(L) 29.4(L)  PLT 150 - 400 K/uL 330 378 264.0  NEUTROABS 1.7 - 7.7 K/uL - - -  LYMPHSABS 0.7 - 4.0 K/uL - - -     There is no height or weight on file to calculate BMI.  Orders:  No orders of the defined types were placed in this encounter.  No orders of the defined types were placed in this encounter.    Procedures: No procedures performed  Clinical Data: No additional findings.  ROS:  All other systems negative, except as noted in the HPI. Review of Systems  Objective: Vital Signs: There were no vitals taken for this visit.  Specialty Comments:  No specialty comments available.  PMFS History: Patient Active Problem List   Diagnosis Date Noted   Hx of AKA (above knee amputation), right (HCC) 07/22/2021   Hx of BKA, right (HCC) 06/29/2021   Dehiscence of amputation stump (HCC)  Acute blood loss anemia 06/24/2021   Postoperative wound infection 06/24/2021   UTI (urinary tract infection) 06/24/2021   Superficial dehiscence of operation wound 06/24/2021   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 ([place])    Bacteremia due to Enterococcus 05/28/2021   Foot osteomyelitis ([place]) 05/26/2021   Sepsis ([place]) 05/26/2021   Digestive disorder 05/18/2021   Bilateral lower extremity edema 05/02/2021   Statin myopathy 03/04/2021   Aortic stenosis 03/04/2021   Sjogren's syndrome ([place]) 02/11/2020   Acute hematogenous osteomyelitis of right foot ([place]) 01/28/2020   Cutaneous abscess of right foot    Subacute osteomyelitis, right ankle and foot ([place])    Statin intolerance 08/16/2019    Gram-negative bacteremia 10/18/2018   Streptococcal bacteremia 10/18/2018   Ulcer of lower extremity, limited to breakdown of skin ([place]) 10/03/2018   CAD S/P percutaneous coronary angioplasty 04/19/2018   Essential hypertension    Moderate persistent asthma 03/21/2018   NAFLD (nonalcoholic fatty liver disease)    Recurrent cellulitis of lower extremity 01/02/2018   Cellulitis of lower leg 01/02/2018   Chronic diarrhea 05/08/2017   H/O Clostridium difficile infection 05/08/2017   Rectal bleeding 05/08/2017   Hyperbilirubinemia 04/29/2016   Hyponatremia 04/29/2016   Cellulitis of right foot 03/09/2016   Mild persistent asthma 02/15/2016   Allergic rhinitis due to pollen 02/15/2016   Anaphylactic reaction due to food 02/10/2016   Diabetes mellitus type 2 in obese ([place]) 02/10/2016   Gastroesophageal reflux disease without esophagitis 02/10/2016   Atopic eczema 02/10/2016   Cervical nerve root disorder 12/15/2015   Lumbar radiculopathy 12/15/2015   Daytime somnolence 11/26/2015   Venous stasis dermatitis of both lower extremities 02/11/2015   Morbid obesity ([place]) 06/19/2014   Abnormal LFTs 03/26/2014   B-complex deficiency 03/26/2014   Benign essential HTN 03/26/2014   Chronic pain associated with significant psychosocial dysfunction 03/26/2014   Diaphragmatic hernia 03/26/2014   Gastroesophageal reflux disease 03/26/2014   Hammer toe 03/26/2014   H/O neoplasm 03/26/2014   Adaptive colitis 03/26/2014   Diabetic polyneuropathy ([place]) 03/26/2014   Deafness, sensorineural 03/26/2014   Fibromyalgia 01/20/2014   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/20/2014   Hyperlipidemia 11/11/2012   Mixed connective tissue disease ([place]) 11/11/2012   Hypothyroidism 99991111   Uncomplicated asthma 99991111   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[place]) 02/20/2012   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     Eczema    Excessive daytime sleepiness 11/26/2015   Fibromyalgia    Gallstones    GERD (gastroesophageal reflux disease)    Heart murmur    "noted for the 1st time on 04/18/2018"   History of blood transfusion 07/2010   "S/P oophorectomy"   History of gout    History of hiatal hernia 1980s   "gone now" (04/18/2018)   Hyperlipidemia  Hypertension    takes Metoprolol and Enalapril daily   Hypothyroidism    takes Synthroid daily   IBS (irritable bowel syndrome)    Migraine    "nothing in the 2000s" (04/18/2018)   Mixed connective tissue disease ([place])    NAFLD (nonalcoholic fatty liver disease)    Pneumonia    "several times" (04/18/2018)   PVC's (premature ventricular contractions)    noted on event monitor 02/2021   Rheumatoid arthritis ([place])    "hands, elbows, shoulders, probably knees" (04/18/2018)   Scoliosis    Sleep apnea    mild - does not use cpap   Spondylosis    Type II diabetes mellitus ([place])    takes Metformin and Hum R daily (04/18/2018)   Walker as ambulation aid    also uses wheelchair    Family History  Problem Relation Age of Onset   CAD Mother    Hypertension Mother    Heart attack Mother    CAD Father    Heart attack Father    Allergic rhinitis Father    Asthma Father    Hypertension Brother    Hypertension Brother    Pancreatic cancer Paternal Aunt    Breast cancer Paternal Aunt    Lung cancer Paternal Aunt    Asthma Son     Past Surgical History:  Procedure Laterality  Date   ABDOMINAL HYSTERECTOMY  06/2005   "w/right ovariy"   AMPUTATION Right 01/28/2020    RIGHT FOURTH AND FIFTH RAY AMPUTATION   AMPUTATION Right 01/28/2020   Procedure: RIGHT FOURTH AND FIFTH RAY AMPUTATION,;  Surgeon: Newt Minion, MD;  Location: [facility];  Service: Orthopedics;  Laterality: Right;   AMPUTATION Right 06/01/2021   Procedure: RIGHT BELOW KNEE AMPUTATION;  Surgeon: Newt Minion, MD;  Location: [facility];  Service: Orthopedics;  Laterality: Right;   AMPUTATION Right 07/22/2021   Procedure: RIGHT ABOVE KNEE AMPUTATION;  Surgeon: Newt Minion, MD;  Location: [facility];  Service: Orthopedics;  Laterality: Right;   APPENDECTOMY     BREAST BIOPSY Bilateral    9 total (04/18/2018)   CORONARY ANGIOPLASTY WITH STENT PLACEMENT  10/01/2015   normal LM, 30% LAD, 85% mid RCA and 95% distal RCA s/p PCI of the mid to distal RCA and now on DAPT with ASA and Ticagrelor.     CORONARY STENT INTERVENTION Right 04/18/2018   Procedure: CORONARY STENT INTERVENTION;  Surgeon: Burnell Blanks, MD;  Location: [facility];  Service: Cardiovascular;  Laterality: Right;   DILATION AND CURETTAGE OF UTERUS     FRACTURE SURGERY     LAPAROSCOPIC CHOLECYSTECTOMY     LEFT HEART CATH AND CORONARY ANGIOGRAPHY N/A 04/18/2018   Procedure: LEFT HEART CATH AND CORONARY ANGIOGRAPHY;  Surgeon: Burnell Blanks, MD;  Location: [facility];  Service: Cardiovascular;  Laterality: N/A;   MUSCLE BIOPSY Left    "leg"   OOPHORECTOMY  07/2010   RADIOLOGY WITH ANESTHESIA N/A 05/25/2016   Procedure: RADIOLOGY WITH ANESTHESIA;  Surgeon: Medication Radiologist, MD;  Location: [facility];  Service: Radiology;  Laterality: N/A;   STUMP REVISION Right 06/29/2021   Procedure: REVISION RIGHT BELOW KNEE AMPUTATION;  Surgeon: Newt Minion, MD;  Location: [facility];  Service: Orthopedics;  Laterality: Right;   TEE WITHOUT CARDIOVERSION N/A 06/01/2021   Procedure: TRANSESOPHAGEAL ECHOCARDIOGRAM (TEE);  Surgeon: Geralynn Rile, MD;  Location: [facility];  Service: Cardiovascular;  Laterality: N/A;   TONSILLECTOMY AND ADENOIDECTOMY  WRIST FRACTURE SURGERY Left    "crushed it"   Social History   Occupational History   Not on file  Tobacco Use   Smoking status: Former    Packs/day: 1.00    Years: 19.00    Pack years: 19.00    Types: Cigarettes    Start date: 56    Quit date: 02/11/2004    Years since quitting: 17.5   Smokeless tobacco: Never  Vaping Use   Vaping Use: Never used  Substance and Sexual Activity   Alcohol use: Yes    Comment: RARE Wine   Drug use: Not Currently    Types: Marijuana    Comment: "only in my teens"   Sexual activity: Yes    Birth control/protection: Surgical    Comment: Hysterectomy

## 2021-08-26 MED ORDER — EZETIMIBE 10 MG PO TABS: 10.0000 mg | ORAL_TABLET | Freq: Every day | ORAL | 2 refills | Status: DC

## 2021-08-26 NOTE — Chronic Care Management (AMB) (Signed)
Care Management Clinical Social Work Note  08/26/2021 Name: [name] MRN: [id number] DOB: 11-02-1960  [name] is a 60 y.o. year old female who is a primary care patient of [name] Dory, DO.  The Care Management team was consulted for assistance with chronic disease management and coordination needs.  Engaged with patient by telephone for initial visit in response to provider referral for social work chronic care management and care coordination services  Consent to Services:  [name] was given information about Care Management services today including:  Care Management services includes personalized support from designated clinical staff supervised by her physician, including individualized plan of care and coordination with other care providers 24/7 contact phone numbers for assistance for urgent and routine care needs. The patient may stop case management services at any time by phone call to the office staff.  Patient agreed to services and consent obtained.   Assessment: Review of patient past medical history, allergies, medications, and health status, including review of relevant consultants reports was performed today as part of a comprehensive evaluation and provision of chronic care management and care coordination services.  SDOH (Social Determinants of Health) assessments and interventions performed:  SDOH Interventions    Flowsheet Row Most Recent Value  SDOH Interventions   Food Insecurity Interventions Assist with Administrator, arts, Stage manager ([facility] only), Other (Comment)  [Referral to [employer] Care Guide]  Financial Strain Interventions Artist, Other (Comment)  Research officer, trade union to [employer] Care Guide]  Housing Interventions Other (Comment)  [Referral to [employer] Care Guide]  Intimate Partner Violence Interventions Intervention Not Indicated  Physical Activity Interventions Patient Refused  Stress Interventions Live Life  Well, Offered [employer], Provide Counseling, Other (Comment)  [Financial Resources Provided]  Social Connections Interventions Intervention Not Indicated  Transportation Interventions Intervention Not Indicated        Advanced Directives Status: Not ready or willing to discuss.  Care Plan  Allergies  Allergen Reactions   Fish Allergy Anaphylaxis    INCLUDES OMEGA 3 OILS   Fish Oil Anaphylaxis and Swelling    THROAT SWELLS INCLUDES FISH AS A CLASS   Omega-3 Fatty Acids Swelling    Throat swelling  Has tolerated Glucerna nutritional drinks    Crestor [Rosuvastatin] Other (See Comments)    Extreme joint pain, to this & other statins   Gabapentin Anxiety    Anxiety on high doses   Lovastatin Other (See Comments)    EXTREME JOINT PAIN   Metronidazole Nausea Only and Swelling    Headache and shakes Nausea and vomiting   Red Yeast Rice [Cholestin] Other (See Comments)    Muscle pain and severe joint pain.    Rotigotine Swelling    (Neupro) Extreme edema    Atrovent Hfa [Ipratropium Bromide Hfa] Other (See Comments)    wheezing   Iodine Swelling   Other Other (See Comments)    Surgical Staples causes redness/infection per patient.   Pepto-Bismol [Bismuth Subsalicylate] Nausea And Vomiting    Extreme vomiting   Victoza [Liraglutide]     Unsure of reaction    Enalapril Cough   Suprep [Na Sulfate-K Sulfate-Mg Sulf] Nausea And Vomiting   Tape Other (See Comments)    Electrodes causes skin breakdown    Outpatient Encounter Medications as of 08/25/2021  Medication Sig Note   albuterol (PROVENTIL) (2.5 MG/3ML) 0.083% nebulizer solution USE 1 VIAL IN NEBULIZER EVERY 4 HOURS AS NEEDED FOR WHEEZING FOR SHORTNESS OF BREATH    albuterol (  VENTOLIN HFA) 108 (90 Base) MCG/ACT inhaler INHALE 2 PUFFS BY MOUTH EVERY 4 HOURS AS NEEDED FOR WHEEZING AND FOR SHORTNESS OF BREATH    ascorbic acid (VITAMIN C) 500 MG tablet Take 1 tablet (500 mg total) by mouth 2 (two) times  daily. (Patient not taking: Reported on 08/23/2021)    aspirin EC 81 MG tablet Take 81 mg by mouth every evening.    betamethasone dipropionate 0.05 % cream Apply 1 application topically 2 (two) times daily as needed (extreme itching).    cephALEXin (KEFLEX) 500 MG capsule Take 1 capsule (500 mg total) by mouth every 6 (six) hours. (Patient not taking: Reported on 08/23/2021) 06/28/2021: 10 day therapy course patient began on 06/24/21   collagenase (SANTYL) ointment Apply 1 application topically daily. (Patient not taking: Reported on 08/23/2021)    cyclobenzaprine (FLEXERIL) 10 MG tablet Take 1 tablet (10 mg total) by mouth 3 (three) times daily as needed for muscle spasms. 08/23/2021: Uses tizanidine OR cyclobenzaprine   diclofenac Sodium (VOLTAREN) 1 % GEL Apply 2 g topically 4 (four) times daily. 08/09/2021: Reports using as needed.   diltiazem (CARDIZEM CD) 240 MG 24 hr capsule Take 1 capsule (240 mg total) by mouth daily. (Patient taking differently: Take 240 mg by mouth at bedtime.)    diphenhydrAMINE (BENADRYL) 25 MG tablet Take 25 mg by mouth daily as needed for allergies or itching.    EPINEPHrine (EPIPEN 2-PAK) 0.3 mg/0.3 mL IJ SOAJ injection USE AS DIRECTED FOR SEVERE ALLERGIC REACTION.    ezetimibe (ZETIA) 10 MG tablet Take 1 tablet (10 mg total) by mouth daily. (Patient not taking: Reported on 08/09/2021)    fluconazole (DIFLUCAN) 150 MG tablet Take 1 tablet (150 mg total) by mouth every 3 (three) days.    fluticasone (FLONASE) 50 MCG/ACT nasal spray USE 2 SPRAY(S) IN EACH NOSTRIL ONCE DAILY AS NEEDED FOR  ALLERGIES  OR  RHINITIS    folic acid (FOLVITE) 1 MG tablet Take 1,000 mcg by mouth daily at 12 noon.    furosemide (LASIX) 20 MG tablet Take 20-40 mg by mouth 3 (three) times daily as needed (fluid retention/swelling). 08/09/2021: Reports takes 1-2 times a day as needed depending on weight > 5 pounds in a day.   gabapentin (NEURONTIN) 400 MG capsule Take 1 capsule by mouth in the  morning, 1 capsule at noon, and 2 capsules at bed time.    GLUCOMANNAN PO Take 1,995 mg by mouth in the morning, at noon, and at bedtime.    glucose blood test strip 1 each by Other route 4 (four) times daily. Contour Next strips    HUMULIN R 500 UNIT/ML injection INJECT 0.08-0.3 MLS (40-150 UNITS TOTAL) INTO THE SKIN THREE TIMES DAILY WITH MEALS    hydrocortisone cream 1 % Apply 1 application topically 3 (three) times daily as needed for itching (minor skin irritation).    hydroxychloroquine (PLAQUENIL) 200 MG tablet Take 1 tablet (200 mg total) by mouth 2 (two) times daily. (Patient not taking: Reported on 08/23/2021)    INSULIN SYRINGE 1CC/29G (B-D INSULIN SYRINGE) 29G X 1/2" 1 ML MISC Use three times daily with insulin.  DX E11.9    isosorbide mononitrate (IMDUR) 30 MG 24 hr tablet Take 1 tablet (30 mg total) by mouth daily. (Patient taking differently: Take 30 mg by mouth at bedtime.)    levocetirizine (XYZAL) 5 MG tablet TAKE 1 TABLET BY MOUTH ONCE DAILY IN THE EVENING    levothyroxine (SYNTHROID) 200 MCG tablet TAKE 1 TABLET  BY MOUTH ONCE DAILY BEFORE BREAKFAST    metFORMIN (GLUCOPHAGE-XR) 500 MG 24 hr tablet Take 2 tablets by mouth twice daily    mupirocin ointment (BACTROBAN) 2 % Apply 1 application topically 2 (two) times daily as needed (wound care).    naloxone (NARCAN) nasal spray 4 mg/0.1 mL Place 1 spray into the nose as needed (opoid overdose).    nitroGLYCERIN (NITROSTAT) 0.4 MG SL tablet DISSOLVE ONE TABLET UNDER THE TONGUE EVERY 5 MINUTES AS NEEDED FOR CHEST PAIN.  DO NOT EXCEED A TOTAL OF 3 DOSES IN 15 MINUTES    omeprazole (PRILOSEC) 40 MG capsule Take 1 capsule (40 mg total) by mouth daily.    Oxycodone HCl 20 MG TABS Take 1 tablet (20 mg total) by mouth every 8 (eight) hours as needed.    oxymetazoline (AFRIN) 0.05 % nasal spray Place 2 sprays into both nostrils 2 (two) times daily as needed.    Polyethyl Glycol-Propyl Glycol (SYSTANE) 0.4-0.3 % SOLN Place 1-2 drops into both  eyes in the morning.    POT BICARB-POT CHLORIDE,25MEQ, 25 MEQ TBEF Take 25 mEq by mouth 3 (three) times daily as needed (with lasix).    pramipexole (MIRAPEX) 1 MG tablet Take 1 tablet (1 mg total) by mouth See admin instructions. Take 1 tablet (1 mg) by mouth scheduled 3 times daily & take 0.5 tablet (0.5 mg) by mouth twice daily if needed for breakthrough restless leg    pregabalin (LYRICA) 25 MG capsule Take 1 capsule (25 mg total) by mouth 2 (two) times daily.    promethazine (PHENERGAN) 25 MG tablet Take 1 tablet (25 mg total) by mouth every 8 (eight) hours as needed for vomiting or nausea.    saccharomyces boulardii (FLORASTOR) 250 MG capsule Take 250 mg by mouth 3 (three) times daily. 08/23/2021: Uses when on antibiotic   tiZANidine (ZANAFLEX) 2 MG tablet Take 1 tablet (2 mg total) by mouth 3 (three) times daily. (Patient taking differently: Take 2 mg by mouth every 8 (eight) hours as needed for muscle spasms.) 08/23/2021: Uses tizanidine OR cyclobenzaprine   valsartan (DIOVAN) 160 MG tablet Take 1 tablet (160 mg total) by mouth daily.    vitamin B-12 (CYANOCOBALAMIN) 1000 MCG tablet Take 1,000 mcg by mouth in the morning.    Vitamin D, Ergocalciferol, (DRISDOL) 1.25 MG (50000 UNIT) CAPS capsule TAKE 1 CAPSULE BY MOUTH ONCE A WEEK (EVERY  7  DAYS)  TAKE  ON  FRIDAY    Zinc Sulfate 220 (50 Zn) MG TABS Take 1 tablet (220 mg total) by mouth daily. (Patient taking differently: Take 220 mg by mouth at bedtime.)    [DISCONTINUED] diltiazem (DILT-XR) 240 MG 24 hr capsule Take 1 capsule (240 mg total) by mouth daily.    No facility-administered encounter medications on file as of 08/25/2021.    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)    Bacteremia due to Enterococcus 05/28/2021   Foot osteomyelitis (HCC) 05/26/2021   Sepsis (HCC) 05/26/2021   Digestive disorder 05/18/2021   Bilateral lower extremity edema 05/02/2021   Statin myopathy 03/04/2021  Aortic stenosis 03/04/2021   Sjogren's syndrome ([place]) 02/11/2020   Acute hematogenous osteomyelitis of right foot ([place]) 01/28/2020   Cutaneous abscess of right foot    Subacute osteomyelitis, right ankle and foot ([place])    Statin intolerance 08/16/2019   Gram-negative bacteremia 10/18/2018   Streptococcal bacteremia 10/18/2018   Ulcer of lower extremity, limited to breakdown of skin ([place]) 10/03/2018   CAD S/P percutaneous coronary angioplasty 04/19/2018   Essential hypertension    Moderate persistent asthma 03/21/2018   NAFLD (nonalcoholic fatty liver disease)    Recurrent cellulitis of lower extremity 01/02/2018   Cellulitis of lower leg 01/02/2018   Chronic diarrhea 05/08/2017   H/O Clostridium difficile infection 05/08/2017   Rectal bleeding 05/08/2017   Hyperbilirubinemia 04/29/2016   Hyponatremia 04/29/2016   Cellulitis of right foot 03/09/2016   Mild persistent asthma 02/15/2016   Allergic rhinitis due to pollen 02/15/2016   Anaphylactic reaction due to food 02/10/2016   Diabetes mellitus type 2 in obese ([place]) 02/10/2016   Gastroesophageal reflux disease without esophagitis 02/10/2016   Atopic eczema 02/10/2016   Cervical nerve root disorder 12/15/2015   Lumbar radiculopathy 12/15/2015   Daytime somnolence 11/26/2015   Venous stasis dermatitis of both lower extremities 02/11/2015   Morbid obesity ([place]) 06/19/2014   Abnormal LFTs 03/26/2014   B-complex deficiency 03/26/2014   Benign essential HTN 03/26/2014   Chronic pain associated with significant psychosocial dysfunction 03/26/2014   Diaphragmatic hernia 03/26/2014    Gastroesophageal reflux disease 03/26/2014   Hammer toe 03/26/2014   H/O neoplasm 03/26/2014   Adaptive colitis 03/26/2014   Diabetic polyneuropathy ([place]) 03/26/2014   Deafness, sensorineural 03/26/2014   Fibromyalgia 01/20/2014   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/20/2014   Hyperlipidemia 11/11/2012   Mixed connective tissue disease ([place]) 11/11/2012   Hypothyroidism 99991111   Uncomplicated asthma [id number]   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[place]) 02/20/2012   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  Care Plan : LCSW Follow-Up Outreach Call.  Updates made by Francis Gaines, LCSW since 08/26/2021 12:00 AM     Problem: Find Help in My Community.   Priority: High     Goal: Find Help in My Community.   Start Date: 08/25/2021  Expected End Date: 11/23/2021  This Visit's Progress: On track  Priority: High  Note:   Current Barriers:   Financial Constraints related to Inability to Work, due to Chronic Medical Conditions and recent Above the Knee Amputation, Inadequate Insurance Coverage, and outstanding Medical Bills/Expenses. Lacks knowledge of community resources. Clinical Goals:  Patient will work with LCSW, and [employer], in an effort to obtain financial assistance for [employer], medical expenses, outstanding medical bills, food, and utilities.   Patient will apply for Social Security Disability, Adult Medicaid, Actor, Low-Income Energy Assistance, and Supplemental Nutrition Assistance. Clinical Interventions:  Collaboration with Primary Care Physician, Dr. [name] regarding development and update of comprehensive plan of care as evidenced by provider  attestation and co-signature. Collaboration with Primary Care Physician, Dr. [name] regarding patient's desire to wait to begin [place], Physical Therapy and Occupational Therapy, through [facility], until 09/11/2021, when new insurance coverage ([employer]) begins.   Inter-disciplinary care team collaboration (see longitudinal plan of care). Assessment of needs, barriers,  agencies contacted, as well as how impacting.  Reviewed various financial resources, discussed options and provided patient with information on how to apply for Social Security Disability, Adult Medicaid, [employer], [place], and Supplemental Nutrition Assistance. Referral placed to [facility] Guides to provide resource information and applications. Discussed plans with patient for ongoing care management follow-up and provided patient with direct contact information for care management team. Mailed Social Security Disability Application, Adult Medicaid Application, Chiropractor, [place] Application, List of Emergency Assistance Programs, and List of Food [place], Haematologist Pantries and [employer].  Advised patient to begin completing applications and contacting agencies of interest. Assisted patient with obtaining information about health plan benefits through [facility], which will be changing to [employer] on 09/11/2021.   Clinical interventions provided:  PHQ 2 and PHQ 9 Depression Screen completed and results reviewed, Solution-Focused Strategies implemented, Active Listening/Reflection utilized, Engineer, petroleum provided, Veterinary surgeon implemented, Problem Solving/Task-Centered Solutions established, Quality of Sleep Assessed and Sleep Hygiene Techniques promoted, Participation in counseling encouraged,  Participation in support groups encouraged, Increase in actives/exercise encouraged, Verbalization of feelings encouraged, Suicidal Ideation/Homicidal Ideation assessed - None present.   Patient Goals/[name] Activities:  Begin working with [employer], and [employer], in an effort to obtain [place], Adult Medicaid, [employer], Human resources officer, and Supplemental Nutrition Assistance. Review list of resources and applications mailed to your home, and begin contacting agencies of interest, as well as completing applications of interest, which include all of the following:    ~ Social Security Disability Application ~ Adult Insurance underwriter ~ [employer] ~ Chief of Staff ~ [place] Application ~ List of Emergency Assistance Programs ~ List of [place] and [place] appointment with Dr. Meridee Score, Orthopedic Surgeon with [facility], scheduled for 09/22/2021 at 9:30 am, to get fitted for prosthesis. Keep appointment with Dr. Leeroy Cha, Physical Medicine and Rehabilitation Specialist with [facility], scheduled for 10/13/2021 at 11:40 am, to obtain prothesis. Contact LCSW directly (# [id number]) if you have questions, need assistance, or if additional social work needs are identified between now and our next scheduled telephone outreach call. Follow-Up Date:  09/26/2021 at 1:30 pm  Nat Christen [place] Licensed Clinical Social Worker [place] [phone]

## 2021-08-26 NOTE — Patient Instructions (Signed)
Visit Information   Thank you for taking time to visit with me today. Please don't hesitate to contact me if I can be of assistance to you before our next scheduled telephone appointment.  Following are the goals we discussed today:   Patient Goals/[name] Activities:  Begin working with [employer], and [employer], in an effort to obtain [place], Adult Medicaid, [employer], Human resources officer, and Supplemental Nutrition Assistance. Review list of resources and applications mailed to your home, and begin contacting agencies of interest, as well as completing applications of interest, which include all of the following:    ~ Social Security Disability Application ~ Adult Insurance underwriter ~ [employer] ~ Chief of Staff ~ [place] Application ~ List of Emergency Assistance Programs ~ List of [place] and [place] appointment with Dr. Meridee Score, Orthopedic Surgeon with [facility], scheduled for 09/22/2021 at 9:30 am, to get fitted for prosthesis. Keep appointment with Dr. Leeroy Cha, Physical Medicine and Rehabilitation Specialist with [facility], scheduled for 10/13/2021 at 11:40 am, to obtain prothesis. Contact LCSW directly (# [id number]) if you have questions, need assistance, or if additional social work needs are identified between now and our next scheduled telephone outreach call.   Follow-Up Date:  09/26/2021 at 1:30 pm  Please call the care guide team at [phone] if you need to cancel or reschedule your appointment.   If you are experiencing a Mental Health or [place] or need someone to talk to, please call the Suicide and Crisis Lifeline: 988 call the [place] National Suicide Prevention Lifeline:  253 803 0745 or TTY: 609-349-1833 TTY 307-775-5857) to talk to a trained counselor call 1-800-273-TALK (toll free, 24 hour hotline) go to [facility] [address] [place] [phone]) call the [facility]: [phone] call 911   Following is a copy of your full care plan:  Care Plan : [place] Call.  Updates made by Francis Gaines, LCSW since 08/26/2021 12:00 AM     Problem: Find Help in My Community.   Priority: High     Goal: Find Help in My Community.   Start Date: 08/25/2021  Expected End Date: 11/23/2021  This Visit's Progress: On track  Priority: High  Note:   Current Barriers:   Financial Constraints related to Inability to Work, due to Chronic Medical Conditions and recent Above the Knee Amputation, Inadequate Insurance Coverage, and outstanding Medical Bills/Expenses. Lacks knowledge of community resources. Clinical Goals:  Patient will work with LCSW, and [employer], in an effort to obtain financial assistance for [employer], medical expenses, outstanding medical bills, food, and utilities.   Patient will apply for Social Security Disability, Adult Medicaid, [place], Low-Income Energy Assistance, and Supplemental Nutrition Assistance. Clinical Interventions:  Collaboration with Primary Care Physician, Dr. [name] regarding development and update of comprehensive plan of care as evidenced by provider attestation and co-signature. Collaboration with Primary Care Physician, Dr. [name] regarding patient's desire to wait to begin [place], Physical Therapy and Occupational Therapy, through [facility], until 09/11/2021, when new insurance coverage ([employer]) begins.   Inter-disciplinary care team collaboration (see longitudinal plan of care). Assessment of needs, barriers, agencies contacted, as well  as how impacting.  Reviewed various financial resources, discussed options and provided patient with information  on how to apply for Social Security Disability, Adult Medicaid, [employer], [place], and Supplemental Nutrition Assistance. Referral placed to [facility] Guides to provide resource information and applications. Discussed plans with patient for ongoing care management follow-up and provided patient with direct contact information for care management team. Mailed Social Security Disability Application, Adult Medicaid Application, Chiropractor, [place] Application, List of Emergency Assistance Programs, and List of Food Faith, Haematologist Pantries and [employer].  Advised patient to begin completing applications and contacting agencies of interest. Assisted patient with obtaining information about health plan benefits through [facility], which will be changing to [employer] on 09/11/2021.   Clinical interventions provided:  PHQ 2 and PHQ 9 Depression Screen completed and results reviewed, Solution-Focused Strategies implemented, Active Listening/Reflection utilized, Engineer, petroleum provided, Veterinary surgeon implemented, Problem Solving/Task-Centered Solutions established, Quality of Sleep Assessed and Sleep Hygiene Techniques promoted, Participation in counseling encouraged, Participation in support groups encouraged, Increase in actives/exercise encouraged, Verbalization of feelings encouraged, Suicidal Ideation/Homicidal Ideation assessed - None present.   Patient Goals/[name] Activities:  Begin working with [employer], and [employer], in an effort to obtain [place], Adult Medicaid, [employer], Human resources officer, and Supplemental Nutrition Assistance. Review  list of resources and applications mailed to your home, and begin contacting agencies of interest, as well as completing applications of interest, which include all of the following:    ~ Social Security Disability Application ~ Adult Insurance underwriter ~ [employer] ~ Chief of Staff ~ [place] Application ~ List of Emergency Assistance Programs ~ List of [place] and [place] appointment with Dr. Meridee Score, Orthopedic Surgeon with [facility], scheduled for 09/22/2021 at 9:30 am, to get fitted for prosthesis. Keep appointment with Dr. Leeroy Cha, Physical Medicine and Rehabilitation Specialist with [facility], scheduled for 10/13/2021 at 11:40 am, to obtain prothesis. Contact LCSW directly (# [id number]) if you have questions, need assistance, or if additional social work needs are identified between now and our next scheduled telephone outreach call. Follow-Up Date:  09/26/2021 at 1:30 pm    Consent to CCM Services: Ms. Steger was given information about Chronic Care Management services including:  CCM service includes personalized support from designated clinical staff supervised by her physician, including individualized plan of care and coordination with other care providers 24/7 contact phone numbers for assistance for urgent and routine care needs. Service will only be billed when office clinical staff spend 20 minutes or more in a month to coordinate care. Only one practitioner may furnish and bill the service in a calendar month. The patient may stop CCM services at any time (effective at the end of the month) by phone call to the office staff. The patient will be responsible for cost sharing (co-pay) of up to 20% of the service fee (after annual deductible is met).  Patient  agreed to services and verbal consent obtained.   Patient verbalizes understanding of instructions provided today and agrees to view in [place].   [place] Licensed Clinical Social Worker [place] [phone]

## 2021-08-28 NOTE — Patient Instructions (Addendum)
[name] It was a pleasure speaking with you today.  I have attached a summary of our visit today and information about your health goals.  I have also mailed you applications for patient assistance for [employer] Care (Humulin insulin) and [place] and [place] (Symbicort inhaler)   Our next appointment is by telephone on September 21, 2021 at 1:45pm  Please call the care guide team at [phone] if you need to cancel or reschedule your appointment.    If you have any questions or concerns, please feel free to contact me either at the phone number below or with a MyChart message.    [name], PharmD Clinical Pharmacist [facility] [phone] (direct line)  [phone] (main office number)   Diabetes: Uncontrolled; A1c improving; A1c goal < 7.0% Lab Results  Component Value Date   HGBA1C 7.3 (H) 05/27/2021    Current treatment: Humulin R U-500 - inject 3 to 40 units 3 times a day Metformine Er 500mg  - take 2 tablets twice a day Interventions:  Mailed application for patient assistance program for for Humulin insulin.  Complete and return to [employer], clinical pharmacist at [facility] Check blood glucose 3 to 4 times a day. Record for review at future appointments.  Reviewed home blood glucose readings and reviewed goals  Fasting blood glucose goal (before meals) = 80 to 130 Blood glucose goal after a meal = less than 180   Hyperlipidemia / Heart Disease: Uncontrolled; LDL goal <70 Lab Results  Component Value Date   CHOL 194 10/27/2020   CHOL 176 05/04/2020   CHOL 217 (H) 04/08/2018   Lab Results  Component Value Date   HDL 46.60 10/27/2020   HDL 41 05/04/2020   HDL 48 04/08/2018   Lab Results  Component Value Date   TRIG 223.0 (H) 10/27/2020   TRIG 204 (H) 05/04/2020   TRIG 116 04/08/2018   Lab Results  Component Value Date   LDLDIRECT 121.0 10/27/2020    Current treatment: Ezetimibe 10mg  daily (last  filled 06/24/2021 - patient states she is not taking - forgot)  Aspirin 81mg  daily  Isosorbide ER 30mg  daily  Interventions:  Sent in updated prescription for ezetimibe 10mg  daily Restart ezetimibe 10mg  daily  Recommend recheck Lipids in 6 to 8 weeks.  Discussed LDL goal  Hypertension:  Controlled; Last 2 of 3 office blood pressure readings have been at goal BP Readings from Last 3 Encounters:  07/29/21 (!) 163/89  07/15/21 128/73  07/05/21 118/62   Blood pressure goal <140/90 Current therapy:  Valsartan 160mg  daily  Diltiazem 240mg  daily  Interventions:  Discussed blood pressure goals Continue current therapy for hypertension   Asthma: Uncontrolled; Goal: decrease number of days per month rescue inhaler is needed.  Current treatment: Albuterol nebulization or inhaler - use as needed up to every 4 hours as needed for wheezing and shortness of breath Interventions:  Discussed benefits of maintenance inhalers in controlling asthma and preventing exacerbations.  Mailed application for Symbicort patient assistance program. Patient is willing to retry Symbicort if approved for patient assistance program. Complete patient assistance program application and return to , clinical pharmacist at [facility]  Medication management Pharmacist Clinical Goal(s): Over the next 90 days, patient will work with PharmD and providers to maintain optimal medication adherence Current pharmacy: [facility] Interventions Comprehensive medication review performed. Reviewed refill history and assessed adherence Continue current medication management strategy Discuss with Dr at this week's  appointment you can restart hydroxychloroquine and if you should continue to take cephalexin.   Patient Goals/[name] Activities Over the next 90 days, patient will:  take medications as prescribed,  check glucose 3 to 4 times per day, document, and provide at future appointments,   collaborate with provider on medication access solutions, and complete patient assistance applications and return to [facility] (Anner Baity, clinical pharmacist) Restart ezetimibe 10mg  daily  Discuss cephalexin and hydroxychloroquine with Dr   Patient verbalizes understanding of instructions provided today and agrees to view in MyChart.

## 2021-08-29 ENCOUNTER — Telehealth: Payer: Self-pay | Admitting: Orthopedic Surgery

## 2021-08-29 NOTE — Telephone Encounter (Signed)
Noted pt was evaluated in the office 08/25/21

## 2021-08-29 NOTE — Telephone Encounter (Signed)
HH calling stating they are discharging pt at this time stating her ins no longer covers them but pt states her ins will change after the first of the year so [facility] will be waiting to see which ins she gets and if they can reaccept her again depending on what plan she has. HH rep calling is Victorino Dike and the best call back number for her is [phone].

## 2021-08-30 ENCOUNTER — Telehealth: Payer: Self-pay

## 2021-08-30 NOTE — Telephone Encounter (Signed)
Entered in error

## 2021-08-30 NOTE — Telephone Encounter (Signed)
° °  Telephone encounter was:  Successful.  08/30/2021 Name: [name] MRN: [id number] DOB: 1961/06/15  [name] is a 60 y.o. year old female who is a primary care patient of Sharlene Dory, DO . The community resource team was consulted for assistance with  medicaid, disability, snap, lieap and food pantries.  Care guide performed the following interventions: Received email confirmation from  patient.   Follow Up Plan:  No further follow up planned at this time. The patient has been provided with needed resources.  Carrol Hougland, AAS Paralegal, [facility] Care Guide  Embedded Care Coordination [place]   Care Management  [address] [place], [place] [address] ??[name]   ?? [phone]   [email or web address]

## 2021-08-30 NOTE — Telephone Encounter (Signed)
° °  Telephone encounter was:  Successful.  08/30/2021 Name: [name] MRN: [id number] DOB: 04/25/1961  [name] is a 60 y.o. year old female who is a primary care patient of Sharlene Dory, DO . The community resource team was consulted for assistance with  disability, medicaid, financial, snap and food pantries.  Care guide performed the following interventions: Spoke with patient confirmed email address sent information for local food pantries.  Letter saved in Epic.  Follow Up Plan:  Care guide will follow up with patient by phone over the next 7-10 days  Jaylen Claude, AAS Paralegal, [facility] Care Guide  Embedded Care Coordination [facility]   Care Management  [address] [place], [place] [address] ??[email or web address]   ?? [phone]   [email or web address]

## 2021-08-31 NOTE — Telephone Encounter (Signed)
Pt has HH nursing coming out for wound care

## 2021-09-01 ENCOUNTER — Telehealth: Payer: Self-pay

## 2021-09-01 NOTE — Telephone Encounter (Signed)
° °  Telephone encounter was:  Successful.  09/01/2021 Name: [name] MRN: [id number] DOB: [birth year]  [name] is a 60 y.o. year old female who is a primary care patient of Sharlene Dory, DO . The community resource team was consulted for assistance with Food Insecurity  Care guide performed the following interventions:  Patient advised food resources have already been provided. At this time, pt does not have any other further questions or concerns. - Per [employer] [facility] Millie has sent out information on 08/30/2021.  Follow Up Plan:  No further follow up planned at this time. The patient has been provided with needed resources.  [facility] Guide, Embedded Care Coordination [facility]  [place], [place] [address]  Main Phone: [phone]   E-mail: [email or web address]  Website: [email or web address]

## 2021-09-06 ENCOUNTER — Telehealth: Payer: 59

## 2021-09-06 ENCOUNTER — Other Ambulatory Visit: Payer: Self-pay | Admitting: Family Medicine

## 2021-09-09 ENCOUNTER — Encounter: Payer: Self-pay | Admitting: Gastroenterology

## 2021-09-14 ENCOUNTER — Encounter: Payer: 59 | Admitting: Registered Nurse

## 2021-09-21 ENCOUNTER — Ambulatory Visit: Payer: 59 | Admitting: Pharmacist

## 2021-09-21 DIAGNOSIS — M351 Other overlap syndromes: Secondary | ICD-10-CM

## 2021-09-21 DIAGNOSIS — Z789 Other specified health status: Secondary | ICD-10-CM

## 2021-09-21 DIAGNOSIS — J4540 Moderate persistent asthma, uncomplicated: Secondary | ICD-10-CM

## 2021-09-21 DIAGNOSIS — G894 Chronic pain syndrome: Secondary | ICD-10-CM

## 2021-09-21 DIAGNOSIS — E1169 Type 2 diabetes mellitus with other specified complication: Secondary | ICD-10-CM

## 2021-09-21 DIAGNOSIS — I10 Essential (primary) hypertension: Secondary | ICD-10-CM

## 2021-09-21 DIAGNOSIS — E781 Pure hyperglyceridemia: Secondary | ICD-10-CM

## 2021-09-21 MED ORDER — EZETIMIBE 10 MG PO TABS: 10.0000 mg | ORAL_TABLET | Freq: Every day | ORAL | 1 refills | Status: DC

## 2021-09-21 NOTE — Patient Instructions (Addendum)
[name] It was a pleasure speaking with you today.  I have attached a summary of our visit today and information about your health goals.   Our next appointment is by telephone on February 6th, 2023 at 3:45pm  Please call the care guide team at [phone] if you need to cancel or reschedule your appointment.   If you have any questions or concerns, please feel free to contact me either at the phone number below or with a MyChart message.   Keep up the good work!  [name], PharmD Clinical Pharmacist [facility] [phone] (direct line)  [phone] (main office number)   How to Treat a Low Glucose Level:  If you have a low blood glucose less than 70, please eat / drink 15 grams of carbohydrates (4 oz of juice, soda, 4 glucose tablets, or 3-4 pieces of hard candy).  It is best so choose a "quick" source of sugar that does no contain fat (chocolate and peanut butter might take longer to increase your blood glucose) Wait 15 minutes and then recheck your blood glucose. If your blood glucose is still less than 70, eat another 15 grams of carbohydrates.  Wait another 15 minutes and recheck your glucose.  Continue this until your blood glucose is over 70. Once you blood glucose is over 70, eat a snack with protein in it to prevent your blood glucose from dropping again.  Diabetes: Uncontrolled; A1c improving; A1c goal < 7.0% Lab Results  Component Value Date   HGBA1C 7.3 (H) 05/27/2021    Current treatment: Humulin R U-500 - inject 3 to 40 units 3 times a day Metformine Er 500mg  - take 2 tablets twice a day Interventions:  Complete and return application for patient assistance program for [employer] for Humulin insulin to [employer] Jimesha Rising, clinical pharmacist at [facility] Check blood glucose 3 to 4 times a day. Record for review at future appointments.  Reviewed home blood glucose readings and reviewed goals  Fasting blood  glucose goal (before meals) = 80 to 130 Blood glucose goal after a meal = less than 180   Hyperlipidemia / Heart Disease: Uncontrolled; LDL goal <70 Lab Results  Component Value Date   CHOL 194 10/27/2020   CHOL 176 05/04/2020   CHOL 217 (H) 04/08/2018   Lab Results  Component Value Date   HDL 46.60 10/27/2020   HDL 41 05/04/2020   HDL 48 04/08/2018   Lab Results  Component Value Date   TRIG 223.0 (H) 10/27/2020   TRIG 204 (H) 05/04/2020   TRIG 116 04/08/2018   Lab Results  Component Value Date   LDLDIRECT 121.0 10/27/2020    Current treatment: Ezetimibe 10mg  daily (last filled 06/24/2021 - patient states she is not taking - forgot)  Aspirin 81mg  daily  Isosorbide ER 30mg  daily  Interventions:  Sent in updated prescription for ezetimibe 10mg  daily Take ezetimibe 10mg  daily  Recommend recheck Lipids in 6 to 8 weeks.  Discussed LDL goal  Hypertension:  Controlled; Last 2 of 3 office blood pressure readings have been at goal BP Readings from Last 3 Encounters:  07/29/21 (!) 163/89  07/15/21 128/73  07/05/21 118/62   Blood pressure goal <140/90 Current therapy:  Valsartan 160mg  daily  Diltiazem 240mg  daily  Interventions:  Discussed blood pressure goals Continue current therapy for hypertension   Asthma: Uncontrolled; Goal: decrease number of days per month rescue inhaler is needed.  Current treatment: Albuterol nebulization or inhaler -  use as needed up to every 4 hours as needed for wheezing and shortness of breath Interventions:  Discussed benefits of maintenance inhalers in controlling asthma and preventing exacerbations.  Mailed application for Symbicort patient assistance program. Patient is willing to retry Symbicort if approved for patient assistance program. Complete patient assistance program application and return to [name] Pastor, clinical pharmacist at [facility]  Medication management Pharmacist Clinical Goal(s): Over the next 90 days,  patient will work with PharmD and providers to maintain optimal medication adherence Current pharmacy: [facility] Interventions Comprehensive medication review performed. Reviewed refill history and assessed adherence Continue current medication management strategy Discuss with Dr Lajoyce Corners at this week's appointment you can restart hydroxychloroquine and if you should continue to take cephalexin.   Patient Goals/[name] Activities Over the next 90 days, patient will:  take medications as prescribed,  check glucose 3 to 4 times per day, document, and provide at future appointments,  collaborate with provider on medication access solutions, and complete patient assistance applications and return to [facility] (Shaila Gilchrest, clinical pharmacist) Continue ezetimibe  daily   Follow Up Plan: Telephone follow up appointment with care management team member scheduled for:  1 to 2 weeks    Patient verbalizes understanding of instructions and care plan provided today and agrees to view in MyChart. Active MyChart status confirmed with patient.

## 2021-09-22 ENCOUNTER — Other Ambulatory Visit: Payer: Self-pay | Admitting: Family Medicine

## 2021-09-22 ENCOUNTER — Encounter: Payer: 59 | Admitting: Orthopedic Surgery

## 2021-09-22 MED ORDER — FLUTICASONE-SALMETEROL 250-50 MCG/ACT IN AEPB: 1.0000 | INHALATION_SPRAY | Freq: Two times a day (BID) | RESPIRATORY_TRACT | 2 refills | Status: DC

## 2021-09-23 ENCOUNTER — Telehealth: Payer: Self-pay | Admitting: Pharmacist

## 2021-09-23 ENCOUNTER — Encounter: Payer: 59 | Admitting: Registered Nurse

## 2021-09-23 ENCOUNTER — Other Ambulatory Visit: Payer: Self-pay

## 2021-09-23 MED ORDER — TIZANIDINE HCL 2 MG PO TABS: 2.0000 mg | ORAL_TABLET | Freq: Three times a day (TID) | ORAL | 0 refills | Status: DC

## 2021-09-23 NOTE — Chronic Care Management (AMB) (Signed)
Chronic Care Management Pharmacy Note  09/23/2021 Name:  [name] MRN:  [id number] DOB:  [birth year]  Summary: Patient has a high deductible [employer] medical plan - [employer]. Cost of insulin and maintenance inhalers are expensive. Provided patient patient assistance program application for Humulin and Symbicort (not currently taking Symbicort but patient is willing to retry if approved for patient assistance program) at last visit but she has not completed. Reminded to complete.  Reviewed her 2023 benefits with [employer]. Most medications are tier 1 preventative which would be $0. Others are tier 1 and are low cost - checked to see cost of Advair since it is listed as tier 1 but cost was $110. Will continue with plan to see if she can get patient assistance program for Symbicort. Patient reports increased back spasms/ pain - requested increase in tizanidine. Forwarded request to PCP.   Subjective: [name] is an 61 y.o. year old female who is a primary patient of Shelda Pal, DO.  The CCM team was consulted for assistance with disease management and care coordination needs.    Engaged with patient by telephone for follow up visit in response to provider referral for pharmacy case management and/or care coordination services.   Consent to Services:  The patient was given information about Chronic Care Management services, agreed to services, and gave verbal consent prior to initiation of services.  Please see initial visit note for detailed documentation.   Patient Care Team: Shelda Pal, DO as PCP - General (Family Medicine) Sueanne Margarita, MD as PCP - Cardiology (Cardiology) Luretha Rued, RN as Case Manager Cherre Robins, RPH-CPP (Pharmacist) [place], Maree Erie, LCSW as Social Worker (Licensed Clinical Social Worker)  Recent office visits: 07/05/2021 - Vonita Moss - started omeperazole; Changed pramipexole to 1mg  3 times a day and 0.5mg  twice  a day if needed for breakthrough restless leg syndrome. stopped esomeprazole 40mg    Recent consult visits: 08/25/2021 - Ortho Surgery (Dr Sharol Given) F/U Right AKA 07/22/2021. Continue with protein supplements. F/U in 4 weeks.   08/18/2021 - Pain Mangement Marcello Moores, NP) Follow above the knee amputation. decreased dose of oxycodone to 20mg  every 8 hours as needed.  08/03/2021 - Pain Management - phone call. Lidocaine 5% patches denied by insurance.  07/19/2021 [facility] - added doxycycline 100mg  bid for 30 days 07/15/2021 - Pain Management - increased oxycodone 20mg  every 8 hours as needed; increased pregabalin 25mg  twice a day  Hospital visits: 07/22/2021 Hospitalization Stop tramadol; Changed Humulin U500 inject 40 to 150 units 3 times a day with meals.  Objective:  Lab Results  Component Value Date   CREATININE 0.88 07/27/2021   CREATININE 1.28 (H) 07/24/2021   CREATININE 0.81 07/23/2021    Lab Results  Component Value Date   HGBA1C 7.3 (H) 05/27/2021   Last diabetic Eye exam: No results found for: HMDIABEYEEXA  Last diabetic Foot exam: No results found for: HMDIABFOOTEX      Component Value Date/Time   CHOL 194 10/27/2020 0809   CHOL 176 05/04/2020 0947   TRIG 223.0 (H) 10/27/2020 0809   HDL 46.60 10/27/2020 0809   HDL 41 05/04/2020 0947   CHOLHDL 4 10/27/2020 0809   VLDL 44.6 (H) 10/27/2020 0809   LDLCALC 100 (H) 05/04/2020 0947   LDLDIRECT 121.0 10/27/2020 0809    Hepatic Function Latest Ref Rng & Units 06/09/2021 05/28/2021 05/26/2021  Total Protein 6.5 - 8.1 g/dL 6.4(L) 6.1(L) 7.8  Albumin 3.5 - 5.0  g/dL 2.5(L) 2.9(L) 3.9  AST 15 - 41 U/L 51(H) 76(H) 42(H)  ALT 0 - 44 U/L 39 36 33  Alk Phosphatase 38 - 126 U/L 63 37(L) 48  Total Bilirubin 0.3 - 1.2 mg/dL 0.6 1.1 0.7  Bilirubin, Direct 0.0 - 0.2 mg/dL - 0.1 -    Lab Results  Component Value Date/Time   TSH 2.170 05/04/2020 09:47 AM   TSH 0.624 10/20/2018 02:50 AM   TSH 1.388 01/02/2018 06:09 PM   TSH 2.451   Test methodology is 3rd generation TSH  12/06/2008 04:55 AM    CBC Latest Ref Rng & Units 07/24/2021 07/23/2021 07/05/2021  WBC 4.0 - 10.5 K/uL 13.5(H) 10.8(H) 8.8  Hemoglobin 12.0 - 15.0 g/dL 7.6(L) 7.9(L) 9.0(L)  Hematocrit 36.0 - 46.0 % 26.8(L) 27.5(L) 29.4(L)  Platelets 150 - 400 K/uL 330 378 264.0    Lab Results  Component Value Date/Time   VD25OH 69.04 07/25/2017 11:55 AM    Clinical ASCVD: Yes  The 10-year ASCVD risk score (Arnett DK, et al., 2019) is: 9.7%   Values used to calculate the score:     Age: 61 years     Sex: Female     Is Non-Hispanic African American: No     Diabetic: Yes     Tobacco smoker: No     Systolic Blood Pressure: 428 mmHg     Is BP treated: Yes     HDL Cholesterol: 46.6 mg/dL     Total Cholesterol: 194 mg/dL     Social History   Tobacco Use  Smoking Status Former   Packs/day: 1.00   Years: 19.00   Pack years: 19.00   Types: Cigarettes   Start date: 74   Quit date: 02/11/2004   Years since quitting: 17.6   Passive exposure: Past  Smokeless Tobacco Never   BP Readings from Last 3 Encounters:  07/29/21 (!) 163/89  07/15/21 128/73  07/05/21 118/62   Pulse Readings from Last 3 Encounters:  07/29/21 84  07/15/21 (!) 103  07/05/21 91   Wt Readings from Last 3 Encounters:  08/18/21 291 lb (132 kg)  07/22/21 291 lb (132 kg)  07/15/21 295 lb (133.8 kg)    Assessment: Review of patient past medical history, allergies, medications, health status, including review of consultants reports, laboratory and other test data, was performed as part of comprehensive evaluation and provision of chronic care management services.   SDOH:  (Social Determinants of Health) assessments and interventions performed:    CCM Care Plan  Allergies  Allergen Reactions   Fish Allergy Anaphylaxis    INCLUDES OMEGA 3 OILS   Fish Oil Anaphylaxis and Swelling    THROAT SWELLS INCLUDES FISH AS A CLASS   Omega-3 Fatty Acids Swelling    Throat swelling   Has tolerated Glucerna nutritional drinks    Crestor [Rosuvastatin] Other (See Comments)    Extreme joint pain, to this & other statins   Gabapentin Anxiety    Anxiety on high doses   Lovastatin Other (See Comments)    EXTREME JOINT PAIN   Metronidazole Nausea Only and Swelling    Headache and shakes Nausea and vomiting   Red Yeast Rice [Cholestin] Other (See Comments)    Muscle pain and severe joint pain.    Rotigotine Swelling    (Neupro) Extreme edema    Atrovent Hfa [Ipratropium Bromide Hfa] Other (See Comments)    wheezing   Iodine Swelling   Other Other (See Comments)    Surgical Staples  causes redness/infection per patient.   Pepto-Bismol [Bismuth Subsalicylate] Nausea And Vomiting    Extreme vomiting   Victoza [Liraglutide]     Unsure of reaction    Enalapril Cough   Suprep [Na Sulfate-K Sulfate-Mg Sulf] Nausea And Vomiting   Tape Other (See Comments)    Electrodes causes skin breakdown    Medications Reviewed Today     Reviewed by Francis Gaines, LCSW (Social Worker) on 08/25/21 at 2103  Med List Status: <None>   Medication Order Taking? Sig Documenting Provider Last Dose Status Informant  albuterol (PROVENTIL) (2.5 MG/3ML) 0.083% nebulizer solution [id number]  USE 1 VIAL IN NEBULIZER EVERY 4 HOURS AS NEEDED FOR WHEEZING FOR SHORTNESS OF BREATH Shelda Pal, DO  Active   albuterol (VENTOLIN HFA) 108 (90 Base) MCG/ACT inhaler [id number]  INHALE 2 PUFFS BY MOUTH EVERY 4 HOURS AS NEEDED FOR WHEEZING AND FOR SHORTNESS OF BREATH Shelda Pal, DO  Active   ascorbic acid (VITAMIN C) 500 MG tablet [id number] No Take 1 tablet (500 mg total) by mouth 2 (two) times daily.  Patient not taking: Reported on 08/23/2021   Flora Lipps Not Taking Active Self  aspirin EC 81 MG tablet [id number] No Take 81 mg by mouth every evening. [provider] Taking Active Self  betamethasone dipropionate 0.05 % cream [id number] No Apply 1 application  topically 2 (two) times daily as needed (extreme itching). [provider] Taking Active Self  cephALEXin (KEFLEX) 500 MG capsule [id number] No Take 1 capsule (500 mg total) by mouth every 6 (six) hours.  Patient not taking: Reported on 08/23/2021   Flora Lipps Not Taking Active Self           Med Note Vinie Sill Jun 28, 2021 10:11 AM) 10 day therapy course patient began on 06/24/21  collagenase (SANTYL) ointment [id number] No Apply 1 application topically daily.  Patient not taking: Reported on 08/23/2021   Bary Leriche, PA-C Not Taking Active Self  cyclobenzaprine (FLEXERIL) 10 MG tablet [id number] No Take 1 tablet (10 mg total) by mouth 3 (three) times daily as needed for muscle spasms. Bary Leriche, PA-C Taking Active Self           Med Note Antony Contras, Lynelle Smoke B   Tue Aug 23, 2021 10:29 AM) Uses tizanidine OR cyclobenzaprine  diclofenac Sodium (VOLTAREN) 1 % GEL [id number] No Apply 2 g topically 4 (four) times daily. Bary Leriche, PA-C Taking Active Self           Med Note Luretha Rued   Tue Aug 09, 2021  2:24 PM) Reports using as needed.  diltiazem (CARDIZEM CD) 240 MG 24 hr capsule [id number] No Take 1 capsule (240 mg total) by mouth daily.  Patient taking differently: Take 240 mg by mouth at bedtime.   Bary Leriche, PA-C Taking Active   diphenhydrAMINE (BENADRYL) 25 MG tablet [id number] No Take 25 mg by mouth daily as needed for allergies or itching. [provider] Taking Active Self  EPINEPHrine (EPIPEN 2-PAK) 0.3 mg/0.3 mL IJ SOAJ injection [id number]  USE AS DIRECTED FOR SEVERE ALLERGIC REACTION. Shelda Pal, DO  Active   ezetimibe (ZETIA) 10 MG tablet [id number] No Take 1 tablet (10 mg total) by mouth daily.  Patient not taking: Reported on 08/09/2021   Bary Leriche, PA-C Not Taking Active   fluconazole (DIFLUCAN) 150 MG tablet [id number]  Take 1 tablet (150 mg total) by mouth every  3 (three) days. Shelda Pal, DO   Active   fluticasone [facility]) 50 MCG/ACT nasal spray [id number] No USE 2 SPRAY(S) IN EACH NOSTRIL ONCE DAILY AS NEEDED FOR  ALLERGIES  OR  RHINITIS Shelda Pal, DO Taking Active   folic acid (FOLVITE) 1 MG tablet [id number] No Take 1,000 mcg by mouth daily at 12 noon. [provider] Taking Active Self  furosemide (LASIX) 20 MG tablet [id number] No Take 20-40 mg by mouth 3 (three) times daily as needed (fluid retention/swelling). [provider] Taking Active Self           Med Note Juleen China, Deno Etienne   Tue Aug 09, 2021  2:29 PM) Reports takes 1-2 times a day as needed depending on weight > 5 pounds in a day.  gabapentin (NEURONTIN) 400 MG capsule [id number]  Take 1 capsule by mouth in the morning, 1 capsule at noon, and 2 capsules at bed time. Shelda Pal, DO  Active   GLUCOMANNAN PO [id number] No Take 1,995 mg by mouth in the morning, at noon, and at bedtime. [provider] Taking Active Self  glucose blood test strip [id number] No 1 each by Other route 4 (four) times daily. Contour Next strips Flora Lipps Taking Active Self  HUMULIN R 500 UNIT/ML injection [id number]  INJECT 0.08-0.3 MLS (40-150 UNITS TOTAL) INTO THE SKIN THREE TIMES DAILY WITH MEALS Shelda Pal, DO  Active   hydrocortisone cream 1 % [id number] No Apply 1 application topically 3 (three) times daily as needed for itching (minor skin irritation). Bary Leriche, PA-C Taking Active Self  hydroxychloroquine (PLAQUENIL) 200 MG tablet [id number] No Take 1 tablet (200 mg total) by mouth 2 (two) times daily.  Patient not taking: Reported on 08/23/2021   Shelda Pal, DO Not Taking Active Self  INSULIN SYRINGE 1CC/29G (B-D INSULIN SYRINGE) 29G X 1/2" 1 ML MISC [id number] No Use three times daily with insulin.  DX E11.9 Shelda Pal, DO Taking Active Self  isosorbide mononitrate (IMDUR) 30 MG 24 hr tablet [id number] No Take 1 tablet (30 mg total) by mouth  daily.  Patient taking differently: Take 30 mg by mouth at bedtime.   Sueanne Margarita, MD Taking Active   levocetirizine (XYZAL) 5 MG tablet [id number]  TAKE 1 TABLET BY MOUTH ONCE DAILY IN THE EVENING Wendling, Crosby Oyster, DO  Active   levothyroxine (SYNTHROID) 200 MCG tablet [id number]  TAKE 1 TABLET BY MOUTH ONCE DAILY BEFORE BREAKFAST Shelda Pal, DO  Active   metFORMIN (GLUCOPHAGE-XR) 500 MG 24 hr tablet [id number]  Take 2 tablets by mouth twice daily Shelda Pal, DO  Active   mupirocin ointment (BACTROBAN) 2 % [id number] No Apply 1 application topically 2 (two) times daily as needed (wound care). [provider] Taking Active Self  naloxone [facility]) nasal spray 4 mg/0.1 mL [id number] No Place 1 spray into the nose as needed (opoid overdose). [provider] Taking Active Self  nitroGLYCERIN (NITROSTAT) 0.4 MG SL tablet [id number]  DISSOLVE ONE TABLET UNDER THE TONGUE EVERY 5 MINUTES AS NEEDED FOR CHEST PAIN.  DO NOT EXCEED A TOTAL OF 3 DOSES IN 15 MINUTES Turner, Eber Hong, MD  Active   omeprazole (PRILOSEC) 40 MG capsule [id number] No Take 1 capsule (40 mg total) by mouth daily. Shelda Pal, DO Taking Active Self  Oxycodone HCl 20 MG TABS [id number] No Take 1 tablet (20 mg total) by mouth every 8 (eight) hours as needed. Marcello Moores,  Vanessa Ralphs, NP Taking Active   oxymetazoline (AFRIN) 0.05 % nasal spray [id number] No Place 2 sprays into both nostrils 2 (two) times daily as needed. [provider] Taking Active Self  Polyethyl Glycol-Propyl Glycol (SYSTANE) 0.4-0.3 % SOLN [id number] No Place 1-2 drops into both eyes in the morning. [provider] Taking Active Self  POT BICARB-POT CHLORIDE,25MEQ, 25 MEQ TBEF [id number] No Take 25 mEq by mouth 3 (three) times daily as needed (with lasix). [provider] Taking Active Self  pramipexole (MIRAPEX) 1 MG tablet [id number] No Take 1 tablet (1 mg total) by mouth See admin instructions.  Take 1 tablet (1 mg) by mouth scheduled 3 times daily & take 0.5 tablet (0.5 mg) by mouth twice daily if needed for breakthrough restless leg Shelda Pal, DO Taking Active Self  pregabalin (LYRICA) 25 MG capsule [id number] No Take 1 capsule (25 mg total) by mouth 2 (two) times daily. Bayard Hugger, NP Taking Active   promethazine (PHENERGAN) 25 MG tablet [id number] No Take 1 tablet (25 mg total) by mouth every 8 (eight) hours as needed for vomiting or nausea. Shelda Pal, DO Taking Active Self  saccharomyces boulardii (FLORASTOR) 250 MG capsule [id number] No Take 250 mg by mouth 3 (three) times daily. [provider] Taking Active Self           Med Note Antony Contras, [facility]   Tue Aug 23, 2021 11:06 AM) Uses when on antibiotic  tiZANidine (ZANAFLEX) 2 MG tablet [id number] No Take 1 tablet (2 mg total) by mouth 3 (three) times daily.  Patient taking differently: Take 2 mg by mouth every 8 (eight) hours as needed for muscle spasms.   Suzan Slick, NP Taking Active Self           Med Note Antony Contras, Leelan Rajewski B   Tue Aug 23, 2021 10:28 AM) Uses tizanidine OR cyclobenzaprine  valsartan (DIOVAN) 160 MG tablet [id number] No Take 1 tablet (160 mg total) by mouth daily. Sueanne Margarita, MD Taking Active   vitamin B-12 (CYANOCOBALAMIN) 1000 MCG tablet [id number] No Take 1,000 mcg by mouth in the morning. [provider] Taking Active Self  Vitamin D, Ergocalciferol, (DRISDOL) 1.25 MG (50000 UNIT) CAPS capsule [id number] No TAKE 1 CAPSULE BY MOUTH ONCE A WEEK (EVERY  7  DAYS)  TAKE  ON  FRIDAY Shelda Pal, DO Taking Active   Zinc Sulfate 220 (50 Zn) MG TABS [id number] No Take 1 tablet (220 mg total) by mouth daily.  Patient taking differently: Take 220 mg by mouth at bedtime.   Bary Leriche, [place] Taking Active             Patient Active Problem List   Diagnosis Date Noted   Hx of AKA (above knee amputation), right ([place]) 07/22/2021   Hx of BKA, right ([place])  06/29/2021   Dehiscence of amputation stump (HCC)    Acute blood loss anemia 06/24/2021   Postoperative wound infection 06/24/2021   UTI (urinary tract infection) 06/24/2021   Superficial dehiscence of operation wound 06/24/2021   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 ([place])    Bacteremia due to Enterococcus 05/28/2021   Foot osteomyelitis ([place]) 05/26/2021   Sepsis ([place]) 05/26/2021   Digestive disorder 05/18/2021   Bilateral lower extremity edema 05/02/2021   Statin myopathy 03/04/2021   Aortic stenosis 03/04/2021   Sjogren's syndrome ([place]) 02/11/2020   Acute hematogenous osteomyelitis of right foot ([place]) 01/28/2020   Cutaneous abscess of  right foot    Subacute osteomyelitis, right ankle and foot ([place])    Statin intolerance 08/16/2019   Gram-negative bacteremia 10/18/2018   Streptococcal bacteremia 10/18/2018   Ulcer of lower extremity, limited to breakdown of skin ([place]) 10/03/2018   CAD S/P percutaneous coronary angioplasty 04/19/2018   Essential hypertension    Moderate persistent asthma 03/21/2018   NAFLD (nonalcoholic fatty liver disease)    Recurrent cellulitis of lower extremity 01/02/2018   Cellulitis of lower leg 01/02/2018   Chronic diarrhea 05/08/2017   H/O Clostridium difficile infection 05/08/2017   Rectal bleeding 05/08/2017   Hyperbilirubinemia 04/29/2016   Hyponatremia 04/29/2016   Cellulitis of right foot 03/09/2016   Mild persistent asthma 02/15/2016   Allergic rhinitis due to pollen 02/15/2016   Anaphylactic reaction due to food 02/10/2016   Diabetes mellitus type 2 in obese ([place]) 02/10/2016   Gastroesophageal reflux disease without esophagitis 02/10/2016   Atopic eczema 02/10/2016   Cervical nerve root disorder 12/15/2015   Lumbar radiculopathy 12/15/2015   Daytime somnolence 11/26/2015   Venous stasis dermatitis of both lower extremities 02/11/2015   Morbid obesity ([place]) 06/19/2014   Abnormal LFTs 03/26/2014   B-complex  deficiency 03/26/2014   Benign essential HTN 03/26/2014   Chronic pain associated with significant psychosocial dysfunction 03/26/2014   Diaphragmatic hernia 03/26/2014   Gastroesophageal reflux disease 03/26/2014   Hammer toe 03/26/2014   H/O neoplasm 03/26/2014   Adaptive colitis 03/26/2014   Diabetic polyneuropathy ([place]) 03/26/2014   Deafness, sensorineural 03/26/2014   Fibromyalgia 01/20/2014   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/20/2014   Hyperlipidemia 11/11/2012   Mixed connective tissue disease ([place]) 11/11/2012   Hypothyroidism 20/94/7096   Uncomplicated asthma 28/36/6294   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[place]) 02/20/2012   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   ANA positive 07/17/2011    Immunization History  Administered Date(s) Administered   Influenza,inj,Quad PF,6+ Mos 06/30/2015   Pneumococcal Polysaccharide-23 02/25/2014    Conditions to be addressed/monitored: CAD, HTN, HLD, DMII, Asthma, Hypothyroidism, and mixed connective tissue disease, chronic pain,   Care Plan : [place]  Updates made by Cherre Robins, RPH-CPP since 09/23/2021 12:00 AM     Problem: Diabetes; neuropathy; HDL; HTN; CAD;  chronic pain      Long-Range Goal: Provide education, support and care coordination for medication therapy and chronic conditions   Start Date: 08/23/2021  Priority: High  Note:   Current Barriers:  Unable to independently afford treatment regimen Unable to achieve control of hyperlipidemia and type 2 DM  Does not adhere to prescribed medication regimen  Pharmacist Clinical Goal(s):  Over the next 90 days, patient will verbalize ability to afford treatment regimen achieve control of type 2 DM and hyperlipidemia as evidenced by A1c < 7.0% and LDL <70 adhere to prescribed medication regimen as evidenced by refill history and attainment of treatment goals  through collaboration  with PharmD and provider.   Interventions: 1:1 collaboration with Shelda Pal, DO regarding development and update of comprehensive plan of care as evidenced by provider attestation and co-signature Inter-disciplinary care team collaboration (see longitudinal plan of care) Comprehensive medication review performed; medication list updated in electronic medical record  Diabetes: Uncontrolled; A1c improving; A1c goal < 7.0% Current treatment: Humulin R U-500 - inject 3 to 40 units 3 times a day Metformine ER $RemoveBefor'500mg'MOPGlMGhCcAd$  - take 2 tablets twice a day Previously has taken: Novolog and Semglee / glargine insulin Patient states insulin can  sometimes cost over $300. Patient has high deductible health plan. [facility]anged [facility] to [employer] in 2023. Current glucose readings: 174, 84, 140, 249, 223 (occurred after she had a low of 70 and drank juice for hypoglycemia)  Denies hypoglycemic/hyperglycemic symptoms Current exercise: none at this time Interventions:  Mailed patient application for patient assistance program for [employer] for Humulin insulin after our last visit, she has not completed. Reminded patient to complete Application. Cost of Humulin U500 with her new plan is $150 Reviewed hypoglycemia treatment.  Reviewed home blood glucose readings and reviewed goals  Fasting blood glucose goal (before meals) = 80 to 130 Blood glucose goal after a meal = less than 180   Hyperlipidemia / CAD: Uncontrolled; LDL goal <70 Adherence improving since our last visit. Current treatment: Ezetimibe 10mg  daily (last filled 09/05/2021 for #30) Aspirin 81mg  daily  Isosorbide ER 30mg  daily  Noted to be statin intolerant - Medications previously tried: rosuvastatin (extreme joint pain), lovastatin (joint pain), Red Yeast Rice (muscle and joint pain), Fish Oil (anaphylaxis and swelling); Nexlizet (stopped due to cost)  Interventions:  Sent in updated prescription for ezetimibe  10mg  daily - for 90 day supply at patient request Recommend recheck Lipids in 6 to 8 weeks.  Discussed LDL goal  Hypertension:  Controlled; Last 2 of 3 office blood pressure readings have been at goal Blood pressure goal <140/90 Current therapy:  Valsartan 160mg  daily  Diltiazem 240mg  daily  Interventions:  Discussed blood pressure goals Continue current therapy for hypertension   Asthma: Uncontrolled; Goal: decrease number of days per month rescue inhaler is needed.  Current treatment: Albuterol nebulization or inhaler - use as needed up to every 4 hours as needed for wheezing and shortness of breath Rescue inhaler use: uses very frequently; on a daily basis Maintenance inhaler: not currently prescribed. Per patient she has tried in past but maintenance inhalers have not been effective though she does also admit that cost has been an issue with adherence.  Past inhalers tried:  Xopenex (ineffective), Symbicort (ineffective/low adherence), Advair (ineffective/low adherence) Interventions:  Discussed benefits of maintenance inhalers in controlling asthma and preventing exacerbations.  Mailed application for Symbicort patient assistance program at last visit.  Reminded to complete Reviewed 2023 benefits with [employer]. Looks like Tour manager or HFA are tier 1. Messaged Dr Nani Ravens and he sent in prescription for Advair Discus 250/26mcg inhaler 1 puff into lungs twice a day but even as tier 1 (not tier 1 preventative) she has deductible to meet and cost is $110 per month. Will stick with plan to apply for patient assistance program for Symbicort.   Medication management Pharmacist Clinical Goal(s): Over the next 90 days, patient will work with PharmD and providers to maintain optimal medication adherence Current pharmacy: [facility] Reviewed patient's 2023 formulary. Most medications on her list are tier 1 / or tier 1 preventative. Tier 1 preventative will be $0.  Patient asked  about increasing dose of either cyclobenzaprine or tizanidine. She does not take both but either for back pain / spasms.  Interventions Comprehensive medication review performed. Reviewed refill history and assessed adherence Continue current medication management strategy Will check with PCP about possibly increasing tizantidine to 4mg  up to 3 times a day as needed. She is at top dose of cyclobenzaprine.    Patient Goals/[name] Activities Over the next 90 days, patient will:  take medications as prescribed, check glucose 3 to 4 times per day, document, and provide at future  appointments, collaborate with provider on medication access solutions, and complete patient assistance applications and return to [place] (Alexa Blish, clinical pharmacist)  Follow Up Plan: Telephone follow up appointment with care management team member scheduled for:  1 to 2 weeks         Medication Assistance: Application for Humulin R and possible Symbicort (she will retry if approved for patient assistance program)   medication assistance program. in process.  Anticipated assistance start date 09/25/2021.  See plan of care for additional detail.  Patient's preferred pharmacy is:  [facility] [place] [address] [place] [address] Phone: [phone] Fax: [phone]   Follow Up:  Patient agrees to Care Plan and Follow-up.  Plan: Telephone follow up appointment with care management team member scheduled for:  10/17/2021  Cherre Robins, PharmD Clinical Pharmacist [facility] [place]

## 2021-09-23 NOTE — Telephone Encounter (Signed)
Patient states she needs refill on tizantidine but asked if dose could be incresaed.  Currently taking tizanidine 2mg  tablets 3 times a day.  Last filled #90 = 30 days on 06/24/2021 Pharmacy: [facility].

## 2021-09-23 NOTE — Telephone Encounter (Signed)
done

## 2021-09-26 ENCOUNTER — Telehealth: Payer: 59 | Admitting: *Deleted

## 2021-09-26 ENCOUNTER — Telehealth: Payer: Self-pay | Admitting: *Deleted

## 2021-09-26 NOTE — Telephone Encounter (Signed)
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   An unsuccessful telephone outreach was attempted today. The patient was referred to the case management team for assistance with care management and care coordination. A HIPAA compliant message was left on voicemail, providing contact information, encouraging patient to return LCSW's call at her earliest convenience.  LCSW will make a second telephone follow-up outreach call within the next two weeks, if a return call is not received from patient in the meantime.  Follow-Up Plan:  Request placed with Scheduling Care Guides to reschedule patient's telephone follow-up outreach call with LCSW.  Danford Bad LCSW Licensed Clinical Social Worker [facility] [employer] [phone]

## 2021-09-27 ENCOUNTER — Other Ambulatory Visit: Payer: Self-pay

## 2021-09-27 ENCOUNTER — Encounter: Payer: Self-pay | Admitting: Registered Nurse

## 2021-09-27 ENCOUNTER — Encounter: Payer: 59 | Attending: Registered Nurse | Admitting: Registered Nurse

## 2021-09-27 VITALS — BP 133/73 | HR 101 | Temp 98.0°F | Ht 62.0 in | Wt 291.0 lb

## 2021-09-27 DIAGNOSIS — Z5181 Encounter for therapeutic drug level monitoring: Secondary | ICD-10-CM | POA: Insufficient documentation

## 2021-09-27 DIAGNOSIS — Z79899 Other long term (current) drug therapy: Secondary | ICD-10-CM | POA: Diagnosis present

## 2021-09-27 DIAGNOSIS — Z89611 Acquired absence of right leg above knee: Secondary | ICD-10-CM | POA: Insufficient documentation

## 2021-09-27 DIAGNOSIS — G8929 Other chronic pain: Secondary | ICD-10-CM | POA: Diagnosis present

## 2021-09-27 DIAGNOSIS — M545 Low back pain, unspecified: Secondary | ICD-10-CM | POA: Diagnosis present

## 2021-09-27 DIAGNOSIS — L89891 Pressure ulcer of other site, stage 1: Secondary | ICD-10-CM | POA: Insufficient documentation

## 2021-09-27 DIAGNOSIS — G894 Chronic pain syndrome: Secondary | ICD-10-CM | POA: Insufficient documentation

## 2021-09-27 DIAGNOSIS — M542 Cervicalgia: Secondary | ICD-10-CM | POA: Insufficient documentation

## 2021-09-27 DIAGNOSIS — L89321 Pressure ulcer of left buttock, stage 1: Secondary | ICD-10-CM | POA: Insufficient documentation

## 2021-09-27 MED ORDER — OXYCODONE HCL 20 MG PO TABS: 1.0000 | ORAL_TABLET | Freq: Three times a day (TID) | ORAL | 0 refills | Status: DC | PRN

## 2021-09-27 NOTE — Progress Notes (Signed)
Subjective:    Patient ID: [name], female    DOB: 01/21/1961, 61 y.o.   MRN: [id number]  HPI: [name] is a 61 y.o. female who returns for follow up appointment for chronic pain and medication refill. She states her  pain is located in her neck, lower back right stump and generalized joint pain. Ms, Allard also reports she has some sores on her left buttock, left lower extremity, she has Stage 1 Decubitus, this was discussed with Dr.Raulkar. She would like for her to be seen a Wound Care to be evaluated and treated, [name] was notified and verbalizes understanding.  [name] was instructed on weight shifting and  turning and positioning, she verbalizes understanding.   She rates her pain 7. Her current exercise regime is  performing stretching exercises.  [name] Morphine equivalent is 72.00 MME.   Last Oral Swab was Performed on 07/15/2021, it was consistent.    Pain Inventory Average Pain 6 Pain Right Now 7 My pain is sharp, burning, stabbing, tingling, and aching  In the last 24 hours, has pain interfered with the following? General activity 9 Relation with others 9 Enjoyment of life 9 What TIME of day is your pain at its worst? varies Sleep (in general) Fair  Pain is worse with: walking, bending, and standing Pain improves with: rest, heat/ice, therapy/exercise, medication, and injections Relief from Meds: 5  Family History  Problem Relation Age of Onset   CAD Mother    Hypertension Mother    Heart attack Mother    CAD Father    Heart attack Father    Allergic rhinitis Father    Asthma Father    Hypertension Brother    Hypertension Brother    Pancreatic cancer Paternal Aunt    Breast cancer Paternal Aunt    Lung cancer Paternal Aunt    Asthma Son    Social History   Socioeconomic History   Marital status: Married    Spouse name: [name] Gockel   Number of children: 1   Years of education: 12   Highest education level: 12th grade   Occupational History   Not on file  Tobacco Use   Smoking status: Former    Packs/day: 1.00    Years: 19.00    Pack years: 19.00    Types: Cigarettes    Start date: 65    Quit date: 02/11/2004    Years since quitting: 17.6    Passive exposure: Past   Smokeless tobacco: Never  Vaping Use   Vaping Use: Never used  Substance and Sexual Activity   Alcohol use: Yes    Comment: RARE Wine   Drug use: Not Currently    Types: Marijuana    Comment: "only in my teens"   Sexual activity: Yes    Birth control/protection: Surgical    Comment: Hysterectomy  Other Topics Concern   Not on file  Social History Narrative   Not on file   Social Determinants of Health   Financial Resource Strain: High Risk   Difficulty of Paying Living Expenses: Very hard  Food Insecurity: Food Insecurity Present   Worried About Running Out of Food in the Last Year: Sometimes true   Ran Out of Food in the Last Year: Sometimes true  Transportation Needs: No Transportation Needs   Lack of Transportation (Medical): No   Lack of Transportation (Non-Medical): No  Physical Activity: Inactive   Days of Exercise per Week: 0 days  Minutes of Exercise per Session: 0 min  Stress: Stress Concern Present   Feeling of Stress : Very much  Social Connections: Moderately Integrated   Frequency of Communication with Friends and Family: More than three times a week   Frequency of Social Gatherings with Friends and Family: More than three times a week   Attends Religious Services: More than 4 times per year   Active Member of Clubs or Organizations: No   Attends Archivist Meetings: Never   Marital Status: Married   Past Surgical History:  Procedure Laterality Date   ABDOMINAL HYSTERECTOMY  06/2005   "w/right ovariy"   AMPUTATION Right 01/28/2020    RIGHT FOURTH AND FIFTH RAY AMPUTATION   AMPUTATION Right 01/28/2020   Procedure: RIGHT FOURTH AND FIFTH RAY AMPUTATION,;  Surgeon: Newt Minion, MD;   Location: [place];  Service: Orthopedics;  Laterality: Right;   AMPUTATION Right 06/01/2021   Procedure: RIGHT BELOW KNEE AMPUTATION;  Surgeon: Newt Minion, MD;  Location: [place];  Service: Orthopedics;  Laterality: Right;   AMPUTATION Right 07/22/2021   Procedure: RIGHT ABOVE KNEE AMPUTATION;  Surgeon: Newt Minion, MD;  Location: [place];  Service: Orthopedics;  Laterality: Right;   APPENDECTOMY     BREAST BIOPSY Bilateral    9 total (04/18/2018)   CORONARY ANGIOPLASTY WITH STENT PLACEMENT  10/01/2015   normal LM, 30% LAD, 85% mid RCA and 95% distal RCA s/p PCI of the mid to distal RCA and now on DAPT with ASA and Ticagrelor.     CORONARY STENT INTERVENTION Right 04/18/2018   Procedure: CORONARY STENT INTERVENTION;  Surgeon: Burnell Blanks, MD;  Location: [place] [facility];  Service: Cardiovascular;  Laterality: Right;   DILATION AND CURETTAGE OF UTERUS     FRACTURE SURGERY     LAPAROSCOPIC CHOLECYSTECTOMY     LEFT HEART CATH AND CORONARY ANGIOGRAPHY N/A 04/18/2018   Procedure: LEFT HEART CATH AND CORONARY ANGIOGRAPHY;  Surgeon: Burnell Blanks, MD;  Location: [place] [facility];  Service: Cardiovascular;  Laterality: N/A;   MUSCLE BIOPSY Left    "leg"   OOPHORECTOMY  07/2010   RADIOLOGY WITH ANESTHESIA N/A 05/25/2016   Procedure: RADIOLOGY WITH ANESTHESIA;  Surgeon: Medication Radiologist, MD;  Location: [place];  Service: Radiology;  Laterality: N/A;   STUMP REVISION Right 06/29/2021   Procedure: REVISION RIGHT BELOW KNEE AMPUTATION;  Surgeon: Newt Minion, MD;  Location: [place];  Service: Orthopedics;  Laterality: Right;   TEE WITHOUT CARDIOVERSION N/A 06/01/2021   Procedure: TRANSESOPHAGEAL ECHOCARDIOGRAM (TEE);  Surgeon: Geralynn Rile, MD;  Location: [place];  Service: Cardiovascular;  Laterality: N/A;   TONSILLECTOMY AND ADENOIDECTOMY     WRIST FRACTURE SURGERY Left    "crushed it"   Past Surgical History:  Procedure Laterality Date   ABDOMINAL HYSTERECTOMY   06/2005   "w/right ovariy"   AMPUTATION Right 01/28/2020    RIGHT FOURTH AND FIFTH RAY AMPUTATION   AMPUTATION Right 01/28/2020   Procedure: RIGHT FOURTH AND FIFTH RAY AMPUTATION,;  Surgeon: Newt Minion, MD;  Location: [place];  Service: Orthopedics;  Laterality: Right;   AMPUTATION Right 06/01/2021   Procedure: RIGHT BELOW KNEE AMPUTATION;  Surgeon: Newt Minion, MD;  Location: [place];  Service: Orthopedics;  Laterality: Right;   AMPUTATION Right 07/22/2021   Procedure: RIGHT ABOVE KNEE AMPUTATION;  Surgeon: Newt Minion, MD;  Location: [place];  Service: Orthopedics;  Laterality: Right;   APPENDECTOMY  BREAST BIOPSY Bilateral    9 total (04/18/2018)   CORONARY ANGIOPLASTY WITH STENT PLACEMENT  10/01/2015   normal LM, 30% LAD, 85% mid RCA and 95% distal RCA s/p PCI of the mid to distal RCA and now on DAPT with ASA and Ticagrelor.     CORONARY STENT INTERVENTION Right 04/18/2018   Procedure: CORONARY STENT INTERVENTION;  Surgeon: Burnell Blanks, MD;  Location: [place] [facility];  Service: Cardiovascular;  Laterality: Right;   DILATION AND CURETTAGE OF UTERUS     FRACTURE SURGERY     LAPAROSCOPIC CHOLECYSTECTOMY     LEFT HEART CATH AND CORONARY ANGIOGRAPHY N/A 04/18/2018   Procedure: LEFT HEART CATH AND CORONARY ANGIOGRAPHY;  Surgeon: Burnell Blanks, MD;  Location: [place] [facility];  Service: Cardiovascular;  Laterality: N/A;   MUSCLE BIOPSY Left    "leg"   OOPHORECTOMY  07/2010   RADIOLOGY WITH ANESTHESIA N/A 05/25/2016   Procedure: RADIOLOGY WITH ANESTHESIA;  Surgeon: Medication Radiologist, MD;  Location: [place];  Service: Radiology;  Laterality: N/A;   STUMP REVISION Right 06/29/2021   Procedure: REVISION RIGHT BELOW KNEE AMPUTATION;  Surgeon: Newt Minion, MD;  Location: [place];  Service: Orthopedics;  Laterality: Right;   TEE WITHOUT CARDIOVERSION N/A 06/01/2021   Procedure: TRANSESOPHAGEAL ECHOCARDIOGRAM (TEE);  Surgeon: Geralynn Rile, MD;  Location: [place];   Service: Cardiovascular;  Laterality: N/A;   TONSILLECTOMY AND ADENOIDECTOMY     WRIST FRACTURE SURGERY Left    "crushed it"   Past Medical History:  Diagnosis Date   Aortic stenosis    mild AS by echo 02/2021   Asthma    "on daily RX and rescue inhaler" (04/18/2018)   Chronic diastolic CHF (congestive heart failure) ([place]) 09/2015   Chronic lower back pain    Chronic neck pain    Chronic pain syndrome    Fentanyl Patch   Colon polyps    Coronary artery disease    cath with normal LM, 30% LAD, 85% mid RCA and 95% distal RCA s/p PCI of the mid to distal RCA and now on DAPT with ASA and Ticagrelor.     Eczema    Excessive daytime sleepiness 11/26/2015   Fibromyalgia    Gallstones    GERD (gastroesophageal reflux disease)    Heart murmur    "noted for the 1st time on 04/18/2018"   History of blood transfusion 07/2010   "S/P oophorectomy"   History of gout    History of hiatal hernia 1980s   "gone now" (04/18/2018)   Hyperlipidemia    Hypertension    takes Metoprolol and Enalapril daily   Hypothyroidism    takes Synthroid daily   IBS (irritable bowel syndrome)    Migraine    "nothing in the 2000s" (04/18/2018)   Mixed connective tissue disease ([place])    NAFLD (nonalcoholic fatty liver disease)    Pneumonia    "several times" (04/18/2018)   PVC's (premature ventricular contractions)    noted on event monitor 02/2021   Rheumatoid arthritis ([place])    "hands, elbows, shoulders, probably knees" (04/18/2018)   Scoliosis    Sleep apnea    mild - does not use cpap   Spondylosis    Type II diabetes mellitus ([place])    takes Metformin and Hum R daily (04/18/2018)   Walker as ambulation aid    also uses wheelchair   BP 133/73    Pulse (!) 101    Temp 98 F (36.7 C) (Oral)  Ht 5\' 2"  (1.575 m)    Wt 291 lb (132 kg)    SpO2 95%    BMI 53.22 kg/m   Opioid Risk Score:   Fall Risk Score:  `1  Depression screen PHQ 2/9  Depression screen [facility] 2/9 08/25/2021 08/09/2021 07/15/2021 05/02/2021  10/27/2020  Decreased Interest 0 0 0 0 0  Down, Depressed, Hopeless 1 1 0 1 0  PHQ - 2 Score 1 1 0 1 0  Altered sleeping - - 3 - -  Tired, decreased energy - - 1 - -  Change in appetite - - 1 - -  Feeling bad or failure about yourself  - - 3 - -  Trouble concentrating - - 0 - -  Moving slowly or fidgety/restless - - 0 - -  Suicidal thoughts - - 0 - -  PHQ-9 Score - - 8 - -  Some recent data might be hidden      Review of Systems  Constitutional: Negative.   HENT: Negative.    Eyes: Negative.   Respiratory: Negative.    Cardiovascular: Negative.   Gastrointestinal: Negative.   Endocrine: Negative.   Genitourinary: Negative.   Musculoskeletal:  Positive for back pain, gait problem and neck pain.  Skin: Negative.   Allergic/Immunologic: Negative.   Hematological: Negative.   Psychiatric/Behavioral: Negative.        Objective:   Physical Exam Vitals and nursing note reviewed.  Constitutional:      Appearance: Normal appearance.  Neck:     Comments: Cervical Paraspinal Tenderness: C-5-C-6 Cardiovascular:     Rate and Rhythm: Normal rate and regular rhythm.     Pulses: Normal pulses.     Heart sounds: Normal heart sounds.  Pulmonary:     Effort: Pulmonary effort is normal.     Breath sounds: Normal breath sounds.  Musculoskeletal:     Cervical back: Normal range of motion and neck supple.     Comments: Normal Muscle Bulk and Muscle Testing Reveals:  Upper Extremities: Decreased ROM 90 Degrees and Muscle Strength5/5  Thoracic and Lumbar Hypersensitivity Lower Extremities: Right: BKA Left Lower Extremity: Full ROM and Muscle Strength 5/5 Arrived in wheelchair     Skin:    General: Skin is warm and dry.     Comments: Stage 1 Decubitus: Left Buttock and Left Lower Extremity   Neurological:     Mental Status: She is alert and oriented to person, place, and time.  Psychiatric:        Mood and Affect: Mood normal.        Behavior: Behavior normal.          Assessment & Plan:  HX: of Right AKA: Dr Sharol Given Following. Continue to Monitor. 09/27/2021 Diabetic Polyneuropathy associated with Type 2 DM: Continue current medication regimen with Lyrica. Continue to Monitor. 09/27/2021 DM Type 2: PCP Following. Continue current medication regimen. Continue to Monitor. 09/27/2021 Chronic  Pain Syndrome : Refilled: Oxycodone 20 mg one tablet every 8 hours as needed for pain #90. We will continue the opioid monitoring program, this consists of regular clinic visits, examinations, urine drug screen, pill counts as well as use of [place] Controlled Substance Reporting system. A 12 month History has been reviewed on the [place] Controlled Substance Reporting System on 09/27/2021 Stage 1 Decubitus: Left Buttock and Left Lower Extremity: Continue Adequate Nutrition and Weight Shifting. RX: [facility] Sandwich Referraal Placed. Ms. Sjostrom verbalizes understanding.   F/U in 1 Month

## 2021-10-04 ENCOUNTER — Encounter: Payer: Self-pay | Admitting: Family

## 2021-10-05 ENCOUNTER — Ambulatory Visit: Payer: 59 | Admitting: Family Medicine

## 2021-10-10 ENCOUNTER — Telehealth: Payer: Self-pay

## 2021-10-10 ENCOUNTER — Ambulatory Visit (INDEPENDENT_AMBULATORY_CARE_PROVIDER_SITE_OTHER): Payer: 59 | Admitting: Family Medicine

## 2021-10-10 ENCOUNTER — Encounter: Payer: Self-pay | Admitting: Family Medicine

## 2021-10-10 ENCOUNTER — Other Ambulatory Visit: Payer: Self-pay

## 2021-10-10 VITALS — BP 118/72 | HR 102 | Temp 98.5°F

## 2021-10-10 DIAGNOSIS — E669 Obesity, unspecified: Secondary | ICD-10-CM

## 2021-10-10 DIAGNOSIS — I739 Peripheral vascular disease, unspecified: Secondary | ICD-10-CM

## 2021-10-10 DIAGNOSIS — M3589 Other specified systemic involvement of connective tissue: Secondary | ICD-10-CM | POA: Diagnosis not present

## 2021-10-10 DIAGNOSIS — E1169 Type 2 diabetes mellitus with other specified complication: Secondary | ICD-10-CM | POA: Diagnosis not present

## 2021-10-10 DIAGNOSIS — L03115 Cellulitis of right lower limb: Secondary | ICD-10-CM

## 2021-10-10 DIAGNOSIS — S78111A Complete traumatic amputation at level between right hip and knee, initial encounter: Secondary | ICD-10-CM | POA: Insufficient documentation

## 2021-10-10 MED ORDER — ALBUTEROL SULFATE HFA 108 (90 BASE) MCG/ACT IN AERS: 2.0000 | INHALATION_SPRAY | RESPIRATORY_TRACT | 5 refills | Status: DC | PRN

## 2021-10-10 MED ORDER — SULFAMETHOXAZOLE-TRIMETHOPRIM 800-160 MG PO TABS: 1.0000 | ORAL_TABLET | Freq: Two times a day (BID) | ORAL | 0 refills | Status: DC

## 2021-10-10 NOTE — Progress Notes (Signed)
Chief Complaint  Patient presents with   Fall    [name] is a 61 y.o. female here for a skin complaint.  She is here with her husband who helps provide the history.  Duration: 6 days Location: Right leg stump (AKA) This stems from her falling on it.  Urine was darker than usual and she was getting up to use the commode.  Since that time it has progressively gotten more red and painful. Pruritic? No Painful? Yes Drainage? No New soaps/lotions/topicals/detergents? No Sick contacts? No Other associated symptoms: Redness, warmth; denies fevers Therapies tried thus far: None She also reports sores in the back of her left thigh.  They are draining clear fluid without pus, spreading redness, or fevers.   Past Medical History:  Diagnosis Date   Aortic stenosis    mild AS by echo 02/2021   Asthma    "on daily RX and rescue inhaler" (04/18/2018)   Chronic diastolic CHF (congestive heart failure) ([facility]) 09/2015   Chronic lower back pain    Chronic neck pain    Chronic pain syndrome    Fentanyl Patch   Colon polyps    Coronary artery disease    cath with normal LM, 30% LAD, 85% mid RCA and 95% distal RCA s/p PCI of the mid to distal RCA and now on DAPT with ASA and Ticagrelor.     Eczema    Excessive daytime sleepiness 11/26/2015   Fibromyalgia    Gallstones    GERD (gastroesophageal reflux disease)    Heart murmur    "noted for the 1st time on 04/18/2018"   History of blood transfusion 07/2010   "S/P oophorectomy"   History of gout    History of hiatal hernia 1980s   "gone now" (04/18/2018)   Hyperlipidemia    Hypertension    takes Metoprolol and Enalapril daily   Hypothyroidism    takes Synthroid daily   IBS (irritable bowel syndrome)    Migraine    "nothing in the 2000s" (04/18/2018)   Mixed connective tissue disease ([facility])    NAFLD (nonalcoholic fatty liver disease)    Pneumonia    "several times" (04/18/2018)   PVC's (premature ventricular contractions)    noted on event  monitor 02/2021   Rheumatoid arthritis ([facility])    "hands, elbows, shoulders, probably knees" (04/18/2018)   Scoliosis    Sleep apnea    mild - does not use cpap   Spondylosis    Type II diabetes mellitus (HCC)    takes Metformin and Hum R daily (04/18/2018)   Walker as ambulation aid    also uses wheelchair    BP 118/72    Pulse (!) 102    Temp 98.5 F (36.9 C) (Oral)    SpO2 98%  Gen: awake, alert, appearing stated age Lungs: No accessory muscle use Skin: See below. +warmth, TTP, no fluctuance, excoriation, drainage Psych: Age appropriate judgment and insight     Cellulitis of right lower extremity - Plan: sulfamethoxazole-trimethoprim (BACTRIM DS) 800-160 MG tablet  Peripheral vascular disease, unspecified (HCC)  Mixed collagen vascular disease (HCC), Chronic  Diabetes mellitus type 2 in obese (HCC), Chronic  Morbid obesity (HCC), Chronic  Unilateral AKA, right (HCC)   She has Keflex at home that she is already started.  Continue this and add Bactrim to cover for MRSA.  She does have a history of this.  This should cover any possible UTI and/or infected ulcer.  She has an appointment with the wound  care team next week which I encouraged her to keep. F/u prn. The patient and her spouse voiced understanding and agreement to the plan.  I spent 30 minutes with the patient discussing the above plan in addition to reviewing her chart on the same day of the visit.  Jilda Rocheicholas Paul [place], DO 10/10/21 3:29 PM

## 2021-10-10 NOTE — Telephone Encounter (Signed)
urse Assessment Nurse: Elesa Hacker, RN, Nash Dimmer Date/Time Lamount Cohen Time): 10/07/2021 3:36:47 PM Confirm and document reason for call. If symptomatic, describe symptoms. ---Caller states he is calling to make an appt for her nurse, and they wanted to have her triaged to make sure she can wait. She had a fall a couple of days ago, and she has hurt her back. She is in a lot of pain. Husband advised that she had an above the knee amputation about a month ago, advised that she had been feeling weak and her leg went out and fell straight down, was not able to get up. Pain at this time is back pain, pain in the back is worse. Does the patient have any new or worsening symptoms? ---Yes Will a triage be completed? ---Yes Related visit to physician within the last 2 weeks? ---No Does the PT have any chronic conditions? (i.e. diabetes, asthma, this includes High risk factors for pregnancy, etc.) ---Yes List chronic conditions. ---Recent above knee amputation connective tissue disease costcondritiais HTN Diabetes, type 2 Is this a behavioral health or substance abuse call? ---No PLEASE NOTE: All timestamps contained within this report are represented as [place] Standard Time. CONFIDENTIALTY NOTICE: This fax transmission is intended only for the addressee. It contains information that is legally privileged, confidential or otherwise protected from use or disclosure. If you are not the intended recipient, you are strictly prohibited from reviewing, disclosing, copying using or disseminating any of this information or taking any action in reliance on or regarding this information. If you have received this fax in error, please notify us immediately by telephone so that we can arrange for its return to [place]. Phone: [phone], Toll-Free: [phone], Fax: [phone] Page: 2 of 2 Call Id: [id number] Guidelines Guideline Title Affirmed Question Affirmed Notes Nurse Date/Time Lamount Cohen Time) Back Injury [1]  Numbness (i.e., loss of sensation) of the leg(s) or foot AND [2] sudden onset after back injury Deaton, RN, Nash Dimmer 10/07/2021 3:40:12 PM Disp. Time Lamount Cohen Time) Disposition Final User 10/07/2021 3:51:50 PM 911 Outcome Documentation Deaton, RN, Nash Dimmer Reason: Husband advised that they will call the ambulance in 5-10 minutes. 10/07/2021 3:46:06 PM Call EMS 911 Now Yes Deaton, RN, Cory Roughen Disagree/Comply Comply Caller Understands Yes PreDisposition Did not know what to do Care Advice Given Per Guideline CALL EMS 911 NOW: * Immediate medical attention is needed. You need to hang up and call 911 (or an ambulance). * Triager Discretion: I'll call you back in a few minutes to be sure you were able to reach them. CARE ADVICE given per Back Injury (Adult) guideline. Comments User: Wandra Scot, RN Date/Time Lamount Cohen Time): 10/07/2021 3:48:01 PM Husband advised that when the ambulance came when she fell 2 days ago that she refused to go with the ambulance because o they would only take her to the [facility], husband advised that she refused to go there. Advised that he will speak with her about calling the ambulance now.   Appt today w/ PCP

## 2021-10-10 NOTE — Patient Instructions (Signed)
Stay on the Bactrim for 7-10 days. Please take Keflex 3 times daily with it for the same duration.   Let me know if you need more Diflucan.   Please stay hydrated.   Let us know if you need anything.

## 2021-10-12 ENCOUNTER — Ambulatory Visit (INDEPENDENT_AMBULATORY_CARE_PROVIDER_SITE_OTHER): Payer: Self-pay | Admitting: Family

## 2021-10-12 ENCOUNTER — Other Ambulatory Visit: Payer: Self-pay

## 2021-10-12 ENCOUNTER — Encounter: Payer: Self-pay | Admitting: Family

## 2021-10-12 DIAGNOSIS — L03113 Cellulitis of right upper limb: Secondary | ICD-10-CM

## 2021-10-12 DIAGNOSIS — S78111A Complete traumatic amputation at level between right hip and knee, initial encounter: Secondary | ICD-10-CM

## 2021-10-12 DIAGNOSIS — Z89611 Acquired absence of right leg above knee: Secondary | ICD-10-CM

## 2021-10-12 DIAGNOSIS — L03115 Cellulitis of right lower limb: Secondary | ICD-10-CM

## 2021-10-12 MED ORDER — HYDROXYZINE HCL 10 MG PO TABS
10.0000 mg | ORAL_TABLET | Freq: Three times a day (TID) | ORAL | 0 refills | Status: DC | PRN
Start: 2021-10-12 — End: 2022-04-19

## 2021-10-12 NOTE — Progress Notes (Signed)
Office Visit Note   Patient: [name]           Date of Birth: Jan 22, 1961           MRN: [id number] Visit Date: 10/12/2021              Requested by: Shelda Pal, [address] [place],  [place] [address] PCP: Shelda Pal, DO  Chief Complaint  Patient presents with   Right Leg - Routine Post Op    07/22/2021 right AKA      HPI: Patient is a 61 year old woman status post right above-the-knee amputation. She is seen today for a concern of infection to right residual limb. She has had a fall 8 days ago.   Has had subsequent edema and redness. Was seen by PCP for this. Is currently on bactrim and keflex for a UTI.   Assessment & Plan: Visit Diagnoses:  No diagnosis found.   Plan: encouraged her to follow with [place] [facility] for prosthesis set up.  Recommended beginning wearing her shrinker around-the-clock for her edema.  She will follow closely for her resolution of her cellulitis.  Discussed return precautions.  Discussed using topical hydrocortisone for itching offered prescription for hydroxyzine as well.  Discussed not taking this together with the benadryl  Follow-Up Instructions: Return in about 2 weeks (around 10/26/2021).   Ortho Exam  Patient is alert, oriented, no adenopathy, well-dressed, normal affect, normal respiratory effort. Examination the incision is well-healed.  Resolving cellulitis with pitting edema to the residual limb there is no ascending cellulitis.  No weeping.   Imaging: No results found.  Labs: Lab Results  Component Value Date   HGBA1C 7.3 (H) 05/27/2021   HGBA1C 7.1 (H) 10/27/2020   HGBA1C 9.0 (H) 01/28/2020   ESRSEDRATE 37 (H) 01/02/2018   CRP 9.8 (H) 01/02/2018   REPTSTATUS 06/23/2021 FINAL 06/21/2021   GRAMSTAIN  01/28/2020    ABUNDANT WBC PRESENT,BOTH PMN AND MONONUCLEAR ABUNDANT GRAM POSITIVE COCCI ABUNDANT GRAM NEGATIVE RODS FEW GRAM POSITIVE RODS    CULT (A) 06/21/2021     >=100,000 COLONIES/mL ESCHERICHIA COLI 30,000 COLONIES/mL PROTEUS MIRABILIS    LABORGA ESCHERICHIA COLI (A) 06/21/2021   LABORGA PROTEUS MIRABILIS (A) 06/21/2021     Lab Results  Component Value Date   ALBUMIN 2.5 (L) 06/09/2021   ALBUMIN 2.9 (L) 05/28/2021   ALBUMIN 3.9 05/26/2021    Lab Results  Component Value Date   MG 1.7 05/02/2021   MG 2.2 10/20/2018   MG 2.2 10/19/2018   Lab Results  Component Value Date   VD25OH 69.04 07/25/2017    No results found for: PREALBUMIN CBC EXTENDED Latest Ref Rng & Units 07/24/2021 07/23/2021 07/05/2021  WBC 4.0 - 10.5 K/uL 13.5(H) 10.8(H) 8.8  RBC 3.87 - 5.11 MIL/uL 3.27(L) 3.43(L) 3.58(L)  HGB 12.0 - 15.0 g/dL 7.6(L) 7.9(L) 9.0(L)  HCT 36.0 - 46.0 % 26.8(L) 27.5(L) 29.4(L)  PLT 150 - 400 K/uL 330 378 264.0  NEUTROABS 1.7 - 7.7 K/uL - - -  LYMPHSABS 0.7 - 4.0 K/uL - - -     There is no height or weight on file to calculate BMI.  Orders:  No orders of the defined types were placed in this encounter.  No orders of the defined types were placed in this encounter.    Procedures: No procedures performed  Clinical Data: No additional findings.  ROS:  All other systems negative, except as noted in the HPI. Review of  Systems  Objective: Vital Signs: There were no vitals taken for this visit.  Specialty Comments:  No specialty comments available.  PMFS History: Patient Active Problem List   Diagnosis Date Noted   Peripheral vascular disease, unspecified ([place]) 10/10/2021   Unilateral AKA, right ([place]) 10/10/2021   Hx of AKA (above knee amputation), right ([place]) 07/22/2021   Hx of BKA, right ([place]) 06/29/2021   Dehiscence of amputation stump ([facility])    Acute blood loss anemia 06/24/2021   Postoperative wound infection 06/24/2021   UTI (urinary tract infection) 06/24/2021   Superficial dehiscence of operation wound 06/24/2021   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 ([place])    Bacteremia due to  Enterococcus 05/28/2021   Sepsis ([place]) 05/26/2021   Digestive disorder 05/18/2021   Bilateral lower extremity edema 05/02/2021   Statin myopathy 03/04/2021   Aortic stenosis 03/04/2021   Sjogren's syndrome ([place]) 02/11/2020   Cutaneous abscess of right foot    Statin intolerance 08/16/2019   Gram-negative bacteremia 10/18/2018   Streptococcal bacteremia 10/18/2018   CAD S/P percutaneous coronary angioplasty 04/19/2018   Essential hypertension    Moderate persistent asthma 03/21/2018   NAFLD (nonalcoholic fatty liver disease)    Recurrent cellulitis of lower extremity 01/02/2018   Cellulitis of lower leg 01/02/2018   Chronic diarrhea 05/08/2017   H/O Clostridium difficile infection 05/08/2017   Rectal bleeding 05/08/2017   Hyperbilirubinemia 04/29/2016   Hyponatremia 04/29/2016   Cellulitis of right foot 03/09/2016   Mild persistent asthma 02/15/2016   Allergic rhinitis due to pollen 02/15/2016   Anaphylactic reaction due to food 02/10/2016   Diabetes mellitus type 2 in obese ([place]) 02/10/2016   Gastroesophageal reflux disease without esophagitis 02/10/2016   Atopic eczema 02/10/2016   Cervical nerve root disorder 12/15/2015   Lumbar radiculopathy 12/15/2015   Daytime somnolence 11/26/2015   Venous stasis dermatitis of both lower extremities 02/11/2015   Morbid obesity ([place]) 06/19/2014   Abnormal LFTs 03/26/2014   B-complex deficiency 03/26/2014   Benign essential HTN 03/26/2014   Chronic pain associated with significant psychosocial dysfunction 03/26/2014   Diaphragmatic hernia 03/26/2014   Gastroesophageal reflux disease 03/26/2014   Hammer toe 03/26/2014   H/O neoplasm 03/26/2014   Adaptive colitis 03/26/2014   Diabetic polyneuropathy ([place]) 03/26/2014   Deafness, sensorineural 03/26/2014   Fibromyalgia 01/20/2014   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/20/2014   Hyperlipidemia 11/11/2012   Mixed connective tissue disease ([place]) 11/11/2012   Hypothyroidism  99991111   Uncomplicated asthma 99991111   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[place]) 02/20/2012   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     Eczema    Excessive daytime sleepiness 11/26/2015   Fibromyalgia    Gallstones    GERD (gastroesophageal reflux disease)    Heart murmur    "noted  for the 1st time on 04/18/2018"   History of blood transfusion 07/2010   "S/P oophorectomy"   History of gout    History of hiatal hernia 1980s   "gone now" (04/18/2018)   Hyperlipidemia    Hypertension    takes Metoprolol and Enalapril daily   Hypothyroidism    takes Synthroid daily   IBS (irritable bowel syndrome)    Migraine    "nothing in the 2000s" (04/18/2018)   Mixed connective tissue disease ([place])    NAFLD (nonalcoholic fatty liver disease)    Pneumonia    "several times" (04/18/2018)   PVC's (premature ventricular contractions)    noted on event monitor 02/2021   Rheumatoid arthritis ([place])    "hands, elbows, shoulders, probably knees" (04/18/2018)   Scoliosis    Sleep apnea    mild - does not use cpap   Spondylosis    Type II diabetes mellitus ([place])    takes Metformin and Hum R daily (04/18/2018)   Walker as ambulation aid    also uses wheelchair    Family History  Problem Relation Age of Onset   CAD Mother    Hypertension Mother    Heart attack Mother    CAD Father    Heart attack Father    Allergic rhinitis Father     Asthma Father    Hypertension Brother    Hypertension Brother    Pancreatic cancer Paternal Aunt    Breast cancer Paternal Aunt    Lung cancer Paternal Aunt    Asthma Son     Past Surgical History:  Procedure Laterality Date   ABDOMINAL HYSTERECTOMY  06/2005   "w/right ovariy"   AMPUTATION Right 01/28/2020    RIGHT FOURTH AND FIFTH RAY AMPUTATION   AMPUTATION Right 01/28/2020   Procedure: RIGHT FOURTH AND FIFTH RAY AMPUTATION,;  Surgeon: Newt Minion, MD;  Location: [facility];  Service: Orthopedics;  Laterality: Right;   AMPUTATION Right 06/01/2021   Procedure: RIGHT BELOW KNEE AMPUTATION;  Surgeon: Newt Minion, MD;  Location: [facility];  Service: Orthopedics;  Laterality: Right;   AMPUTATION Right 07/22/2021   Procedure: RIGHT ABOVE KNEE AMPUTATION;  Surgeon: Newt Minion, MD;  Location: [facility];  Service: Orthopedics;  Laterality: Right;   APPENDECTOMY     BREAST BIOPSY Bilateral    9 total (04/18/2018)   CORONARY ANGIOPLASTY WITH STENT PLACEMENT  10/01/2015   normal LM, 30% LAD, 85% mid RCA and 95% distal RCA s/p PCI of the mid to distal RCA and now on DAPT with ASA and Ticagrelor.     CORONARY STENT INTERVENTION Right 04/18/2018   Procedure: CORONARY STENT INTERVENTION;  Surgeon: Burnell Blanks, MD;  Location: [facility];  Service: Cardiovascular;  Laterality: Right;   DILATION AND CURETTAGE OF UTERUS     FRACTURE SURGERY     LAPAROSCOPIC CHOLECYSTECTOMY     LEFT HEART CATH AND CORONARY ANGIOGRAPHY N/A 04/18/2018   Procedure: LEFT HEART CATH AND CORONARY ANGIOGRAPHY;  Surgeon: Burnell Blanks, MD;  Location: [facility];  Service: Cardiovascular;  Laterality: N/A;   MUSCLE BIOPSY Left    "leg"   OOPHORECTOMY  07/2010   RADIOLOGY WITH ANESTHESIA N/A 05/25/2016   Procedure: RADIOLOGY WITH ANESTHESIA;  Surgeon: Medication Radiologist, MD;  Location: [facility];  Service: Radiology;  Laterality: N/A;   STUMP REVISION Right 06/29/2021   Procedure: REVISION RIGHT  BELOW KNEE AMPUTATION;  Surgeon: Newt Minion, MD;  Location: [facility];  Service:  Orthopedics;  Laterality: Right;   TEE WITHOUT CARDIOVERSION N/A 06/01/2021   Procedure: TRANSESOPHAGEAL ECHOCARDIOGRAM (TEE);  Surgeon: Geralynn Rile, MD;  Location: [facility];  Service: Cardiovascular;  Laterality: N/A;   TONSILLECTOMY AND ADENOIDECTOMY     WRIST FRACTURE SURGERY Left    "crushed it"   Social History   Occupational History   Not on file  Tobacco Use   Smoking status: Former    Packs/day: 1.00    Years: 19.00    Pack years: 19.00    Types: Cigarettes    Start date: 72    Quit date: 02/11/2004    Years since quitting: 17.6    Passive exposure: Past   Smokeless tobacco: Never  Vaping Use   Vaping Use: Never used  Substance and Sexual Activity   Alcohol use: Yes    Comment: RARE Wine   Drug use: Not Currently    Types: Marijuana    Comment: "only in my teens"   Sexual activity: Yes    Birth control/protection: Surgical    Comment: Hysterectomy

## 2021-10-13 ENCOUNTER — Encounter: Payer: 59 | Attending: Physical Medicine and Rehabilitation | Admitting: Physical Medicine and Rehabilitation

## 2021-10-13 DIAGNOSIS — G8929 Other chronic pain: Secondary | ICD-10-CM | POA: Insufficient documentation

## 2021-10-13 DIAGNOSIS — M545 Low back pain, unspecified: Secondary | ICD-10-CM | POA: Insufficient documentation

## 2021-10-13 DIAGNOSIS — M2550 Pain in unspecified joint: Secondary | ICD-10-CM | POA: Insufficient documentation

## 2021-10-13 DIAGNOSIS — Z89611 Acquired absence of right leg above knee: Secondary | ICD-10-CM | POA: Insufficient documentation

## 2021-10-14 ENCOUNTER — Ambulatory Visit: Payer: Self-pay | Admitting: *Deleted

## 2021-10-14 DIAGNOSIS — I739 Peripheral vascular disease, unspecified: Secondary | ICD-10-CM

## 2021-10-14 DIAGNOSIS — G894 Chronic pain syndrome: Secondary | ICD-10-CM

## 2021-10-14 DIAGNOSIS — K929 Disease of digestive system, unspecified: Secondary | ICD-10-CM

## 2021-10-14 DIAGNOSIS — Z89611 Acquired absence of right leg above knee: Secondary | ICD-10-CM

## 2021-10-14 DIAGNOSIS — I10 Essential (primary) hypertension: Secondary | ICD-10-CM

## 2021-10-14 DIAGNOSIS — E669 Obesity, unspecified: Secondary | ICD-10-CM

## 2021-10-14 DIAGNOSIS — E781 Pure hyperglyceridemia: Secondary | ICD-10-CM

## 2021-10-14 DIAGNOSIS — T8149XA Infection following a procedure, other surgical site, initial encounter: Secondary | ICD-10-CM

## 2021-10-14 DIAGNOSIS — J4540 Moderate persistent asthma, uncomplicated: Secondary | ICD-10-CM

## 2021-10-14 DIAGNOSIS — E1169 Type 2 diabetes mellitus with other specified complication: Secondary | ICD-10-CM

## 2021-10-14 DIAGNOSIS — S78111A Complete traumatic amputation at level between right hip and knee, initial encounter: Secondary | ICD-10-CM

## 2021-10-14 DIAGNOSIS — L03119 Cellulitis of unspecified part of limb: Secondary | ICD-10-CM

## 2021-10-14 DIAGNOSIS — L03115 Cellulitis of right lower limb: Secondary | ICD-10-CM

## 2021-10-14 DIAGNOSIS — G720 Drug-induced myopathy: Secondary | ICD-10-CM

## 2021-10-14 DIAGNOSIS — T8781 Dehiscence of amputation stump: Secondary | ICD-10-CM

## 2021-10-14 NOTE — Patient Instructions (Signed)
Visit Information  Thank you for taking time to visit with me today. Please don't hesitate to contact me if I can be of assistance to you before our next scheduled telephone appointment.  Following are the goals we discussed today:  Patient Goals/[name] Activities:  Continue working with [employer], and [employer], on a bi-weekly basis, in an effort to obtain Social Security Disability, Adult Medicaid, [employer], Product/process development scientist, and Supplemental Nutrition Assistance. Continue to review list of resources and applications mailed to your home, and begin contacting agencies of interest, as well as complete applications of interest, which include all of the following:    ~ Social Security Disability Application ~ Adult Personal assistant ~ [employer] Assistance Program Application ~ Advertising account executive ~ Supplemental Nutrition Assistance Program Application ~ List of Emergency Assistance Programs ~ List of Food Banks, Food Pantries and Soup Kitchen Contact LCSW directly [phone]) if you have questions, need assistance, or if additional social work needs are identified between now and our next scheduled telephone outreach call. Follow-Up Date:  10/31/2021 at 10:45 am  Please call the care guide team at [phone] if you need to cancel or reschedule your appointment.   If you are experiencing a Mental Health or Behavioral Health Crisis or need someone to talk to, please call the Suicide and Crisis Lifeline: 988 call the [place] National Suicide Prevention Lifeline: 564-249-5557 or TTY: (909)785-4196 TTY (204) 766-0235) to talk to a trained counselor call 1-800-273-TALK (toll free, 24 hour hotline) go to [facility] [address] [place] [phone]) call the [facility] Crisis Line: [phone] call 911   Patient verbalizes understanding of instructions  and care plan provided today and agrees to view in MyChart. Active MyChart status confirmed with patient.    Danford Bad LCSW Licensed Clinical Social Worker [facility] [employer] [phone]

## 2021-10-14 NOTE — Chronic Care Management (AMB) (Signed)
Care Management Clinical Social Work Note  10/14/2021 Name: [name] MRN: [id number] DOB: 08/20/1961  [name] is a 61 y.o. year old female who is a primary care patient of [name] Pal, DO.  The Care Management team was consulted for assistance with chronic disease management and coordination needs.  Engaged with patient by telephone for follow up visit in response to provider referral for social work chronic care management and care coordination services  Consent to Services:  [name] was given information about Care Management services today including:  Care Management services includes personalized support from designated clinical staff supervised by her physician, including individualized plan of care and coordination with other care providers 24/7 contact phone numbers for assistance for urgent and routine care needs. The patient may stop case management services at any time by phone call to the office staff.  Patient agreed to services and consent obtained.   Assessment: Review of patient past medical history, allergies, medications, and health status, including review of relevant consultants reports was performed today as part of a comprehensive evaluation and provision of chronic care management and care coordination services.  SDOH (Social Determinants of Health) assessments and interventions performed:    Advanced Directives Status: Not addressed in this encounter.  Care Plan  Allergies  Allergen Reactions   Fish Allergy Anaphylaxis    INCLUDES OMEGA 3 OILS   Fish Oil Anaphylaxis and Swelling    THROAT SWELLS INCLUDES FISH AS A CLASS   Omega-3 Fatty Acids Swelling    Throat swelling  Has tolerated Glucerna nutritional drinks    Crestor [Rosuvastatin] Other (See Comments)    Extreme joint pain, to this & other statins   Gabapentin Anxiety    Anxiety on high doses   Lovastatin Other (See Comments)    EXTREME JOINT PAIN   Metronidazole Nausea  Only and Swelling    Headache and shakes Nausea and vomiting   Red Yeast Rice [Cholestin] Other (See Comments)    Muscle pain and severe joint pain.    Rotigotine Swelling    (Neupro) Extreme edema    Atrovent Hfa [Ipratropium Bromide Hfa] Other (See Comments)    wheezing   Iodine Swelling   Other Other (See Comments)    Surgical Staples causes redness/infection per patient.   Pepto-Bismol [Bismuth Subsalicylate] Nausea And Vomiting    Extreme vomiting   Victoza [Liraglutide]     Unsure of reaction    Enalapril Cough   Suprep [Na Sulfate-K Sulfate-Mg Sulf] Nausea And Vomiting   Tape Other (See Comments)    Electrodes causes skin breakdown    Outpatient Encounter Medications as of 10/14/2021  Medication Sig Note   albuterol (PROAIR HFA) 108 (90 Base) MCG/ACT inhaler Inhale 2 puffs into the lungs every 4 (four) hours as needed for wheezing or shortness of breath.    albuterol (PROVENTIL) (2.5 MG/3ML) 0.083% nebulizer solution USE 1 VIAL IN NEBULIZER EVERY 4 HOURS AS NEEDED FOR WHEEZING FOR SHORTNESS OF BREATH    ascorbic acid (VITAMIN C) 500 MG tablet Take 1 tablet (500 mg total) by mouth 2 (two) times daily.    aspirin EC 81 MG tablet Take 81 mg by mouth every evening.    betamethasone dipropionate 0.05 % cream Apply 1 application topically 2 (two) times daily as needed (extreme itching).    collagenase (SANTYL) ointment Apply 1 application topically daily.    cyclobenzaprine (FLEXERIL) 10 MG tablet Take 1 tablet (10 mg total) by mouth 3 (three) times  daily as needed for muscle spasms. 08/23/2021: Uses tizanidine OR cyclobenzaprine   diclofenac Sodium (VOLTAREN) 1 % GEL Apply 2 g topically 4 (four) times daily. 08/09/2021: Reports using as needed.   DILT-XR 240 MG 24 hr capsule Take 240 mg by mouth daily.    EPINEPHrine (EPIPEN 2-PAK) 0.3 mg/0.3 mL IJ SOAJ injection USE AS DIRECTED FOR SEVERE ALLERGIC REACTION.    ezetimibe (ZETIA) 10 MG tablet Take 1 tablet (10 mg total) by mouth  daily.    fluconazole (DIFLUCAN) 150 MG tablet Take 1 tablet (150 mg total) by mouth every 3 (three) days.    fluticasone (FLONASE) 50 MCG/ACT nasal spray USE 2 SPRAY(S) IN EACH NOSTRIL ONCE DAILY AS NEEDED FOR  ALLERGIES  OR  RHINITIS    fluticasone-salmeterol (ADVAIR) 250-50 MCG/ACT AEPB Inhale 1 puff into the lungs in the morning and at bedtime.    folic acid (FOLVITE) 1 MG tablet Take 1,000 mcg by mouth daily at 12 noon.    furosemide (LASIX) 20 MG tablet Take 20-40 mg by mouth 3 (three) times daily as needed (fluid retention/swelling). 08/09/2021: Reports takes 1-2 times a day as needed depending on weight > 5 pounds in a day.   gabapentin (NEURONTIN) 400 MG capsule Take 1 capsule by mouth in the morning, 1 capsule at noon, and 2 capsules at bed time.    GLUCOMANNAN PO Take 1,995 mg by mouth in the morning, at noon, and at bedtime.    glucose blood test strip 1 each by Other route 4 (four) times daily. Contour Next strips    HUMULIN R 500 UNIT/ML injection INJECT 0.08-0.3 MLS (40-150 UNITS TOTAL) INTO THE SKIN THREE TIMES DAILY WITH MEALS    hydrocortisone cream 1 % Apply 1 application topically 3 (three) times daily as needed for itching (minor skin irritation).    hydroxychloroquine (PLAQUENIL) 200 MG tablet Take 1 tablet (200 mg total) by mouth 2 (two) times daily.    hydrOXYzine (ATARAX) 10 MG tablet Take 1 tablet (10 mg total) by mouth 3 (three) times daily as needed.    INSULIN SYRINGE 1CC/29G (B-D INSULIN SYRINGE) 29G X 1/2" 1 ML MISC Use three times daily with insulin.  DX E11.9    isosorbide mononitrate (IMDUR) 30 MG 24 hr tablet Take 1 tablet (30 mg total) by mouth daily. (Patient taking differently: Take 30 mg by mouth at bedtime.)    levocetirizine (XYZAL) 5 MG tablet TAKE 1 TABLET BY MOUTH ONCE DAILY IN THE EVENING    levothyroxine (SYNTHROID) 200 MCG tablet TAKE 1 TABLET BY MOUTH ONCE DAILY BEFORE BREAKFAST    metFORMIN (GLUCOPHAGE-XR) 500 MG 24 hr tablet Take 2 tablets by mouth  twice daily    mupirocin ointment (BACTROBAN) 2 % Apply 1 application topically 2 (two) times daily as needed (wound care).    naloxone (NARCAN) nasal spray 4 mg/0.1 mL Place 1 spray into the nose as needed (opoid overdose).    nitroGLYCERIN (NITROSTAT) 0.4 MG SL tablet DISSOLVE ONE TABLET UNDER THE TONGUE EVERY 5 MINUTES AS NEEDED FOR CHEST PAIN.  DO NOT EXCEED A TOTAL OF 3 DOSES IN 15 MINUTES    omeprazole (PRILOSEC) 40 MG capsule Take 1 capsule (40 mg total) by mouth daily.    Oxycodone HCl 20 MG TABS Take 1 tablet (20 mg total) by mouth every 8 (eight) hours as needed.    oxymetazoline (AFRIN) 0.05 % nasal spray Place 2 sprays into both nostrils 2 (two) times daily as needed.    Polyethyl  Glycol-Propyl Glycol (SYSTANE) 0.4-0.3 % SOLN Place 1-2 drops into both eyes in the morning.    POT BICARB-POT CHLORIDE,25MEQ, 25 MEQ TBEF Take 25 mEq by mouth 3 (three) times daily as needed (with lasix).    pramipexole (MIRAPEX) 0.5 MG tablet Take 0.5 mg by mouth 2 (two) times daily.    pramipexole (MIRAPEX) 1 MG tablet Take 1 tablet (1 mg total) by mouth See admin instructions. Take 1 tablet (1 mg) by mouth scheduled 3 times daily & take 0.5 tablet (0.5 mg) by mouth twice daily if needed for breakthrough restless leg    pregabalin (LYRICA) 25 MG capsule Take 1 capsule (25 mg total) by mouth 2 (two) times daily.    promethazine (PHENERGAN) 25 MG tablet Take 1 tablet (25 mg total) by mouth every 8 (eight) hours as needed for vomiting or nausea.    saccharomyces boulardii (FLORASTOR) 250 MG capsule Take 250 mg by mouth 3 (three) times daily. 08/23/2021: Uses when on antibiotic   sulfamethoxazole-trimethoprim (BACTRIM DS) 800-160 MG tablet Take 1 tablet by mouth 2 (two) times daily for 10 days.    tiZANidine (ZANAFLEX) 2 MG tablet Take 1 tablet (2 mg total) by mouth 3 (three) times daily.    valsartan (DIOVAN) 160 MG tablet Take 1 tablet (160 mg total) by mouth daily.    vitamin B-12 (CYANOCOBALAMIN) 1000 MCG  tablet Take 1,000 mcg by mouth in the morning.    Vitamin D, Ergocalciferol, (DRISDOL) 1.25 MG (50000 UNIT) CAPS capsule TAKE 1 CAPSULE BY MOUTH ONCE A WEEK (EVERY  7  DAYS)  TAKE  ON  FRIDAY    Zinc Sulfate 220 (50 Zn) MG TABS Take 1 tablet (220 mg total) by mouth daily. (Patient taking differently: Take 220 mg by mouth at bedtime.)    No facility-administered encounter medications on file as of 10/14/2021.    Patient Active Problem List   Diagnosis Date Noted   Peripheral vascular disease, unspecified ([place]) 10/10/2021   Unilateral AKA, right ([place]) 10/10/2021   Hx of AKA (above knee amputation), right ([place]) 07/22/2021   Hx of BKA, right ([place]) 06/29/2021   Dehiscence of amputation stump ([facility])    Acute blood loss anemia 06/24/2021   Postoperative wound infection 06/24/2021   UTI (urinary tract infection) 06/24/2021   Superficial dehiscence of operation wound 06/24/2021   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 ([place])    Bacteremia due to Enterococcus 05/28/2021   Sepsis ([place]) 05/26/2021   Digestive disorder 05/18/2021   Bilateral lower extremity edema 05/02/2021   Statin myopathy 03/04/2021   Aortic stenosis 03/04/2021   Sjogren's syndrome ([employer]) 02/11/2020   Cutaneous abscess of right foot    Statin intolerance 08/16/2019   Gram-negative bacteremia 10/18/2018   Streptococcal bacteremia 10/18/2018   CAD S/P percutaneous coronary angioplasty 04/19/2018   Essential hypertension    Moderate persistent asthma 03/21/2018   NAFLD (nonalcoholic fatty liver disease)    Recurrent cellulitis of lower extremity 01/02/2018   Cellulitis of lower leg 01/02/2018   Chronic diarrhea 05/08/2017   H/O Clostridium difficile infection 05/08/2017   Rectal bleeding 05/08/2017   Hyperbilirubinemia 04/29/2016   Hyponatremia 04/29/2016   Cellulitis of right foot 03/09/2016   Mild persistent asthma 02/15/2016   Allergic rhinitis due to pollen 02/15/2016   Anaphylactic reaction  due to food 02/10/2016   Diabetes mellitus type 2 in obese ([place]) 02/10/2016   Gastroesophageal reflux disease without esophagitis 02/10/2016   Atopic eczema 02/10/2016   Cervical nerve root  disorder 12/15/2015   Lumbar radiculopathy 12/15/2015   Daytime somnolence 11/26/2015   Venous stasis dermatitis of both lower extremities 02/11/2015   Morbid obesity ([place]) 06/19/2014   Abnormal LFTs 03/26/2014   B-complex deficiency 03/26/2014   Benign essential HTN 03/26/2014   Chronic pain associated with significant psychosocial dysfunction 03/26/2014   Diaphragmatic hernia 03/26/2014   Gastroesophageal reflux disease 03/26/2014   Hammer toe 03/26/2014   H/O neoplasm 03/26/2014   Adaptive colitis 03/26/2014   Diabetic polyneuropathy ([place]) 03/26/2014   Deafness, sensorineural 03/26/2014   Fibromyalgia 01/20/2014   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/20/2014   Hyperlipidemia 11/11/2012   Mixed connective tissue disease ([place]) 11/11/2012   Hypothyroidism 99991111   Uncomplicated asthma [id number]   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[place]) 02/20/2012   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  Film/video editor, Limited Access to Caregiver, and [employer].  Care Plan : LCSW Follow-Up Outreach Call.  Updates made by Francis Gaines, LCSW since 10/14/2021 12:00 AM     Problem: Find Help in My Community.   Priority: High     Goal: Find Help in My Community.   Start Date: 08/25/2021  Expected End Date: 11/23/2021  This Visit's Progress: On track  Recent Progress: On track  Priority: High  Note:   Current Barriers:   Financial Constraints related to Inability to Work, due to Chronic Medical Conditions and recent Above the Knee Amputation, Inadequate Insurance Coverage, and outstanding Medical  Bills/Expenses. Lacks knowledge of community resources. Clinical Goals:  Patient will work with LCSW, and [employer], in an effort to obtain financial assistance for [employer], medical expenses, outstanding medical bills, food, and utilities.   Patient will apply for Social Security Disability, Adult Medicaid, [place], Low-Income Energy Assistance, and Supplemental Nutrition Assistance. Interventions:  Collaboration with Primary Care Physician, Dr. [name] regarding development and update of comprehensive plan of care as evidenced by provider attestation and co-signature. Inter-disciplinary care team collaboration (see longitudinal plan of care). Clinical Interventions: Solution-Focused Strategies implemented, Active Listening/Reflection utilized, Emotional Support provided, Participation in Counseling encouraged, Participation in Support Groups addressed, Increase in Actives/Exercise emphasized, Verbalization of Feelings discussed. Patient Goals/[name] Activities:  Continue working with [employer], and [employer], on a bi-weekly basis, in an effort to obtain Social Security Disability, Adult Medicaid, [employer], Human resources officer, and Supplemental Nutrition Assistance. Continue to review list of resources and applications mailed to your home, and begin contacting agencies of interest, as well as complete applications of interest, which include all of the following:    ~ Social Security Disability Application ~ Adult Insurance underwriter ~ [place] Program Application ~ Chief of Staff ~ [place] Application ~ List of Emergency Assistance Programs ~ List of [place] and [place] directly [phone]) if you have questions, need assistance, or if additional social work needs  are identified between now and our next scheduled telephone outreach call. Follow-Up Date:  10/31/2021 at 10:45 am  [place] Clinical Social Worker [place] [phone]

## 2021-10-17 ENCOUNTER — Encounter (HOSPITAL_BASED_OUTPATIENT_CLINIC_OR_DEPARTMENT_OTHER): Payer: 59 | Attending: Internal Medicine | Admitting: Internal Medicine

## 2021-10-17 ENCOUNTER — Other Ambulatory Visit: Payer: Self-pay

## 2021-10-17 ENCOUNTER — Ambulatory Visit: Payer: 59 | Admitting: Pharmacist

## 2021-10-17 DIAGNOSIS — I509 Heart failure, unspecified: Secondary | ICD-10-CM | POA: Insufficient documentation

## 2021-10-17 DIAGNOSIS — I110 Hypertensive heart disease with heart failure: Secondary | ICD-10-CM | POA: Diagnosis not present

## 2021-10-17 DIAGNOSIS — E1169 Type 2 diabetes mellitus with other specified complication: Secondary | ICD-10-CM

## 2021-10-17 DIAGNOSIS — X58XXXA Exposure to other specified factors, initial encounter: Secondary | ICD-10-CM | POA: Diagnosis not present

## 2021-10-17 DIAGNOSIS — I890 Lymphedema, not elsewhere classified: Secondary | ICD-10-CM | POA: Diagnosis not present

## 2021-10-17 DIAGNOSIS — S31105A Unspecified open wound of abdominal wall, periumbilic region without penetration into peritoneal cavity, initial encounter: Secondary | ICD-10-CM | POA: Diagnosis not present

## 2021-10-17 DIAGNOSIS — Z89611 Acquired absence of right leg above knee: Secondary | ICD-10-CM | POA: Diagnosis not present

## 2021-10-17 DIAGNOSIS — E039 Hypothyroidism, unspecified: Secondary | ICD-10-CM | POA: Insufficient documentation

## 2021-10-17 DIAGNOSIS — J45909 Unspecified asthma, uncomplicated: Secondary | ICD-10-CM | POA: Insufficient documentation

## 2021-10-17 DIAGNOSIS — E11622 Type 2 diabetes mellitus with other skin ulcer: Secondary | ICD-10-CM | POA: Insufficient documentation

## 2021-10-17 DIAGNOSIS — L89150 Pressure ulcer of sacral region, unstageable: Secondary | ICD-10-CM | POA: Insufficient documentation

## 2021-10-17 DIAGNOSIS — L98492 Non-pressure chronic ulcer of skin of other sites with fat layer exposed: Secondary | ICD-10-CM | POA: Insufficient documentation

## 2021-10-17 DIAGNOSIS — M069 Rheumatoid arthritis, unspecified: Secondary | ICD-10-CM | POA: Insufficient documentation

## 2021-10-17 DIAGNOSIS — L03115 Cellulitis of right lower limb: Secondary | ICD-10-CM

## 2021-10-17 DIAGNOSIS — J4540 Moderate persistent asthma, uncomplicated: Secondary | ICD-10-CM

## 2021-10-17 DIAGNOSIS — Z87891 Personal history of nicotine dependence: Secondary | ICD-10-CM | POA: Diagnosis not present

## 2021-10-17 DIAGNOSIS — I739 Peripheral vascular disease, unspecified: Secondary | ICD-10-CM

## 2021-10-17 DIAGNOSIS — E669 Obesity, unspecified: Secondary | ICD-10-CM

## 2021-10-17 DIAGNOSIS — E781 Pure hyperglyceridemia: Secondary | ICD-10-CM

## 2021-10-17 MED ORDER — SULFAMETHOXAZOLE-TRIMETHOPRIM 800-160 MG PO TABS: 1.0000 | ORAL_TABLET | Freq: Two times a day (BID) | ORAL | 0 refills | Status: AC

## 2021-10-17 MED ORDER — ASPIRIN EC 81 MG PO TBEC: 81.0000 mg | DELAYED_RELEASE_TABLET | Freq: Every evening | ORAL | 1 refills | Status: DC

## 2021-10-17 NOTE — Progress Notes (Signed)
Peddie, ZANYLAH HARDIE ([id number]) Visit Report for 10/17/2021 Allergy List Details Patient Name: Date of Service: Dimarco, [name] 10/17/2021 7:30 A M Medical Record Number: [id number] Patient Account Number: [id number] Date of Birth/Sex: Treating RN: Jun 26, 1961 (61 y.o. [name] Primary Care Nesta Scaturro: [name] Other Clinician: Referring Lilla Callejo: Treating Ahkeem Goede/Extender: Egbert Garibaldi, EUNICE Weeks in Treatment: 0 Allergies Active Allergies fish oil Fish Containing Products Crestor lovastatin metronidazole Red Yeast Rice ([place] Purpureus) rotigotine Atrovent Suprep Bowel Prep Kit adhesive tape Betadine peanut Severity: Severe Dial soap Reaction: itching Type: Allergen Allergy Notes Electronic Signature(s) Signed: 10/17/2021 4:47:34 PM By: Deon Pilling RN, BSN Entered By: Deon Pilling on 10/17/2021 07:57:19 -------------------------------------------------------------------------------- Arrival Information Details Patient Name: Date of Service: Mcelroy, [name] 10/17/2021 7:30 A M Medical Record Number: [id number] Patient Account Number: [id number] Date of Birth/Sex: Treating RN: [birth year] (61 y.o. Helene Shoe, Meta.Reding Primary Care Adael Culbreath: [name] Other Clinician: Referring Aayat Hajjar: Treating Evora Schechter/Extender: Bosie Clos Weeks in Treatment: 0 Visit Information Patient Arrived: Wheel Chair Arrival Time: 07:48 Accompanied By: husband Transfer Assistance: Manual Patient Identification Verified: Yes Secondary Verification Process Completed: Yes Patient Requires Transmission-Based Precautions: No Patient Has Alerts: No History Since Last Visit Added or deleted any medications: No Any new allergies or adverse reactions: No Had a fall or experienced change in activities of daily living that may affect risk of falls: No Signs or symptoms of abuse/neglect since last visito No Hospitalized since last visit:  Yes Implantable device outside of the clinic excluding cellular tissue based products placed in the center since last visit: No Electronic Signature(s) Signed: 10/17/2021 4:47:34 PM By: Deon Pilling RN, BSN Entered By: Deon Pilling on 10/17/2021 07:49:06 -------------------------------------------------------------------------------- Clinic Level of Care Assessment Details Patient Name: Date of Service: Nicholes, [name] 10/17/2021 7:30 A M Medical Record Number: [id number] Patient Account Number: [id number] Date of Birth/Sex: Treating RN: [birth year] (61 y.o. [name] Primary Care Kaveri Perras: [name] Other Clinician: Referring Vannak Montenegro: Treating Birtha Hatler/Extender: Egbert Garibaldi, EUNICE Weeks in Treatment: 0 Clinic Level of Care Assessment Items TOOL 1 Quantity Score X- 1 0 Use when EandM and Procedure is performed on INITIAL visit ASSESSMENTS - Nursing Assessment / Reassessment X- 1 20 General Physical Exam (combine w/ comprehensive assessment (listed just below) when performed on new pt. evals) X- 1 25 Comprehensive Assessment (HX, ROS, Risk Assessments, Wounds Hx, etc.) ASSESSMENTS - Wound and Skin Assessment / Reassessment X- 1 10 Dermatologic / Skin Assessment (not related to wound area) ASSESSMENTS - Ostomy and/or Continence Assessment and Care []  - 0 Incontinence Assessment and Management []  - 0 Ostomy Care Assessment and Management (repouching, etc.) PROCESS - Coordination of Care []  - 0 Simple Patient / Family Education for ongoing care X- 1 20 Complex (extensive) Patient / Family Education for ongoing care X- 1 10 Staff obtains Programmer, systems, Records, T Results / Process Orders est []  - 0 Staff telephones HHA, Nursing Homes / Clarify orders / etc []  - 0 Routine Transfer to another Facility (non-emergent condition) []  - 0 Routine Hospital Admission (non-emergent condition) X- 1 15 New Admissions / Biomedical engineer / Ordering NPWT  Apligraf, etc. , []  - 0 Emergency Hospital Admission (emergent condition) PROCESS - Special Needs []  - 0 Pediatric / Minor Patient Management []  - 0 Isolation Patient Management []  - 0 Hearing / Language / Visual special needs []  - 0 Assessment of Community assistance (transportation, D/C planning, etc.) []  - 0 Additional assistance /  Altered mentation []  - 0 Support Surface(s) Assessment (bed, cushion, seat, etc.) INTERVENTIONS - Miscellaneous []  - 0 External ear exam []  - 0 Patient Transfer (multiple staff / Civil Service fast streamer / Similar devices) []  - 0 Simple Staple / Suture removal (25 or less) []  - 0 Complex Staple / Suture removal (26 or more) []  - 0 Hypo/Hyperglycemic Management (do not check if billed separately) []  - 0 Ankle / Brachial Index (ABI) - do not check if billed separately Has the patient been seen at the hospital within the last three years: Yes Total Score: 100 Level Of Care: New/Established - Level 3 Electronic Signature(s) Signed: 10/17/2021 4:47:34 PM By: Deon Pilling RN, BSN Entered By: Deon Pilling on 10/17/2021 09:07:09 -------------------------------------------------------------------------------- Encounter Discharge Information Details Patient Name: Date of Service: Leggitt, [name] 10/17/2021 7:30 A M Medical Record Number: [id number] Patient Account Number: [id number] Date of Birth/Sex: Treating RN: Apr 14, 1961 (61 y.o. [name] Primary Care Jenniger Figiel: [name] Other Clinician: Referring Milla Wahlberg: Treating Adamarie Izzo/Extender: Egbert Garibaldi, EUNICE Weeks in Treatment: 0 Encounter Discharge Information Items Post Procedure Vitals Discharge Condition: Stable Temperature (F): 98.6 Ambulatory Status: Wheelchair Pulse (bpm): 77 Discharge Destination: Home Respiratory Rate (breaths/min): 20 Transportation: Private Auto Blood Pressure (mmHg): 118/77 Accompanied By: husband Schedule Follow-up Appointment: Yes Clinical  Summary of Care: Electronic Signature(s) Signed: 10/17/2021 4:47:34 PM By: Deon Pilling RN, BSN Entered By: Deon Pilling on 10/17/2021 09:08:56 -------------------------------------------------------------------------------- Lower Extremity Assessment Details Patient Name: Date of Service: Lawson, [name] 10/17/2021 7:30 A M Medical Record Number: [id number] Patient Account Number: [id number] Date of Birth/Sex: Treating RN: 1961/01/19 (61 y.o. [name] Primary Care Sydell Prowell: [name] Other Clinician: Referring Trinidad Petron: Treating Jaleia Hanke/Extender: Bosie Clos Weeks in Treatment: 0 Electronic Signature(s) Signed: 10/17/2021 4:47:34 PM By: Deon Pilling RN, BSN Entered By: Deon Pilling on 10/17/2021 08:02:29 -------------------------------------------------------------------------------- Multi Wound Chart Details Patient Name: Date of Service: Mcinnis, [name] 10/17/2021 7:30 A M Medical Record Number: [id number] Patient Account Number: [id number] Date of Birth/Sex: Treating RN: 1961/09/08 (61 y.o. [name] Primary Care Raphael Espe: [name] Other Clinician: Referring Evanthia Maund: Treating Ifeanyichukwu Wickham/Extender: Egbert Garibaldi, EUNICE Weeks in Treatment: 0 Photos: Proximal, Midline Abdomen - Lower Distal, Medial Abdomen - Lower Coccyx Wound Location: Quadrant Quadrant Gradually Appeared Gradually Appeared Pressure Injury Wounding Event: Dehisced Wound Dehisced Wound Pressure Ulcer Primary Etiology: N/A N/A N/A Secondary Etiology: Cataracts, Lymphedema, Asthma, Cataracts, Lymphedema, Asthma, Cataracts, Lymphedema, Asthma, Comorbid History: Congestive Heart Failure, Coronary Congestive Heart Failure, Coronary Congestive Heart Failure, Coronary Artery Disease, Hypertension, Artery Disease, Hypertension, Artery Disease, Hypertension, Peripheral Arterial Disease, Peripheral Peripheral Arterial Disease, Peripheral  Peripheral Arterial Disease, Peripheral Venous Disease, Type II Diabetes, Venous Disease, Type II Diabetes, Venous Disease, Type II Diabetes, Rheumatoid Arthritis, Neuropathy Rheumatoid Arthritis, Neuropathy Rheumatoid Arthritis, Neuropathy 09/06/2021 09/06/2021 09/14/2021 Date Acquired: 0 0 0 Weeks of Treatment: Open Open Open Wound Status: No No No Wound Recurrence: No Yes No Clustered Wound: N/A N/A N/A Clustered Quantity: 5.4x2.5x0.2 4x0.5x0.1 1.4x0.9x0.1 Measurements L x W x D (cm) 10.603 1.571 0.99 A (cm) : rea 2.121 0.157 0.099 Volume (cm) : 0.00% N/A N/A % Reduction in A rea: 0.00% N/A N/A % Reduction in Volume: Full Thickness Without Exposed Full Thickness Without Exposed Unstageable/Unclassified Classification: Support Structures Support Structures Medium Medium Medium Exudate A mount: Serosanguineous Serosanguineous Serosanguineous Exudate Type: red, brown red, brown red, brown Exudate Color: Distinct, outline attached N/A N/A Wound Margin: Large (67-100%) Large (67-100%) None Present (  0%) Granulation A mount: Red, Pink Red N/A Granulation Quality: Small (1-33%) Small (1-33%) Large (67-100%) Necrotic A mount: Fat Layer (Subcutaneous Tissue): Yes Fat Layer (Subcutaneous Tissue): Yes Fat Layer (Subcutaneous Tissue): Yes Exposed Structures: Fascia: No Fascia: No Fascia: No Tendon: No Tendon: No Tendon: No Muscle: No Muscle: No Muscle: No Joint: No Joint: No Joint: No Bone: No Bone: No Bone: No None None None Epithelialization: Chemical/Enzymatic/Mechanical Chemical/Enzymatic/Mechanical Chemical/Enzymatic/Mechanical Debridement: N/A N/A N/A Instrument: None None None Bleeding: Debridement Treatment Response: Procedure was tolerated well Procedure was tolerated well Procedure was tolerated well Post Debridement Measurements L x 5.4x2.5x0.2 4x0.5x0.1 1.4x0.9x0.1 W x D (cm) 2.121 0.157 0.099 Post Debridement Volume: (cm) N/A N/A  Unstageable/Unclassified Post Debridement Stage: Debridement Debridement Debridement Procedures Performed: Wound Number: [id number] N/A Photos: N/A Left Labia Left, Posterior Upper Leg N/A Wound Location: Gradually Appeared Gradually Appeared N/A Wounding Event: Abrasion Abrasion N/A 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 Secondary Etiology: Extremity Cataracts, Lymphedema, Asthma, Cataracts, Lymphedema, Asthma, N/A Comorbid History: Congestive Heart Failure, Coronary Congestive Heart Failure, Coronary Artery Disease, Hypertension, Artery Disease, Hypertension, Peripheral Arterial Disease, Peripheral Peripheral Arterial Disease, Peripheral Venous Disease, Type II Diabetes, Venous Disease, Type II Diabetes, Rheumatoid Arthritis, Neuropathy Rheumatoid Arthritis, Neuropathy 05/12/2021 09/05/2021 N/A Date Acquired: 0 0 N/A Weeks of Treatment: Open Open N/A Wound Status: No No N/A Wound Recurrence: No Yes N/A Clustered Wound: N/A 5 N/A Clustered Quantity: 2.9x1.6x0.1 8.8x7.5x0.1 N/A Measurements L x W x D (cm) 3.644 51.836 N/A A (cm) : rea 0.364 5.184 N/A Volume (cm) : 0.00% 0.00% N/A % Reduction in A rea: 0.00% 0.00% N/A % Reduction in Volume: Full Thickness Without Exposed Full Thickness Without Exposed N/A Classification: Support Structures Support Structures Medium Medium N/A Exudate A mount: Serosanguineous Serosanguineous N/A Exudate Type: red, brown red, brown N/A Exudate Color: Distinct, outline attached Distinct, outline attached N/A Wound Margin: Large (67-100%) Large (67-100%) N/A Granulation A mount: Pink, Pale Pink, Pale N/A Granulation Quality: Small (1-33%) Small (1-33%) N/A Necrotic A mount: Fat Layer (Subcutaneous Tissue): Yes Fat Layer (Subcutaneous Tissue): Yes N/A Exposed Structures: Fascia: No Fascia: No Tendon: No Tendon: No Muscle: No Muscle: No Joint: No Joint: No Bone: No Bone: No None Small (1-33%)  N/A Epithelialization: Chemical/Enzymatic/Mechanical Chemical/Enzymatic/Mechanical N/A Debridement: N/A N/A N/A Instrument: None None N/A Bleeding: Debridement Treatment Response: Procedure was tolerated well Procedure was tolerated well N/A Post Debridement Measurements L x 2.9x1.6x0.1 8.5x7.5x0.1 N/A W x D (cm) 0.364 5.007 N/A Post Debridement Volume: (cm) N/A N/A N/A Post Debridement Stage: Debridement Debridement N/A Procedures Performed: Treatment Notes Electronic Signature(s) Signed: 10/17/2021 9:14:51 AM By: Kalman Shan DO Signed: 10/17/2021 4:43:35 PM By: Lorrin Jackson Entered By: Kalman Shan on 10/17/2021 09:02:58 -------------------------------------------------------------------------------- Multi-Disciplinary Care Plan Details Patient Name: Date of Service: Strack, [name] 10/17/2021 7:30 A M Medical Record Number: [id number] Patient Account Number: [id number] Date of Birth/Sex: Treating RN: [birth year] (61 y.o. [name] Primary Care Kyera Felan: [name] Other Clinician: Referring Fallyn Munnerlyn: Treating Akeel Reffner/Extender: Egbert Garibaldi, EUNICE Weeks in Treatment: 0 Active Inactive Abuse / Safety / Falls / Self Care Management Nursing Diagnoses: Impaired physical mobility Potential for falls Goals: Patient will not develop complications from immobility Date Initiated: 10/17/2021 Target Resolution Date: 11/18/2021 Goal Status: Active Patient/caregiver will verbalize/demonstrate measures taken to improve the patient's personal safety Date Initiated: 10/17/2021 Target Resolution Date: 11/18/2021 Goal Status: Active Interventions: Assess fall risk on admission and as needed Assess: immobility, friction, shearing, incontinence upon admission and as needed  Assess self care needs on admission and as needed Provide education on fall prevention Notes: Nutrition Nursing Diagnoses: Potential for alteratiion in Nutrition/Potential for  imbalanced nutrition Goals: Patient/caregiver agrees to and verbalizes understanding of need to use nutritional supplements and/or vitamins as prescribed Date Initiated: 10/17/2021 Target Resolution Date: 11/17/2021 Goal Status: Active Interventions: Provide education on elevated blood sugars and impact on wound healing Provide education on nutrition Treatment Activities: Giving encouragement to exercise : 10/17/2021 Obtain HgA1c : 10/17/2021 Notes: Orientation to the Wound Care Program Nursing Diagnoses: Knowledge deficit related to the wound healing center program Goals: Patient/caregiver will verbalize understanding of the [facility] Date Initiated: 10/17/2021 Target Resolution Date: 11/18/2021 Goal Status: Active Interventions: Provide education on orientation to the wound center Notes: Pain, Acute or Chronic Nursing Diagnoses: Pain, acute or chronic: actual or potential Potential alteration in comfort, pain Goals: Patient will verbalize adequate pain control and receive pain control interventions during procedures as needed Date Initiated: 10/17/2021 Target Resolution Date: 11/17/2021 Goal Status: Active Interventions: Encourage patient to take pain medications as prescribed Provide education on pain management Reposition patient for comfort Treatment Activities: Administer pain control measures as ordered : 10/17/2021 Notes: Wound/Skin Impairment Nursing Diagnoses: Knowledge deficit related to ulceration/compromised skin integrity Goals: Patient/caregiver will verbalize understanding of skin care regimen Date Initiated: 10/17/2021 Target Resolution Date: 11/18/2021 Goal Status: Active Interventions: Assess patient/caregiver ability to obtain necessary supplies Assess patient/caregiver ability to perform ulcer/skin care regimen upon admission and as needed Provide education on ulcer and skin care Treatment Activities: Skin care regimen initiated :  10/17/2021 Topical wound management initiated : 10/17/2021 Notes: Electronic Signature(s) Signed: 10/17/2021 4:47:34 PM By: Deon Pilling RN, BSN Entered By: Deon Pilling on 10/17/2021 08:04:22 -------------------------------------------------------------------------------- Non-Wound Condition Assessment Details Patient Name: Date of Service: Alumbaugh, [name] 10/17/2021 7:30 A M Medical Record Number: [id number] Patient Account Number: [id number] Date of Birth/Sex: Treating RN: 01-Mar-1961 (61 y.o. [name] Primary Care Kemari Mares: [name] Other Clinician: Referring Dan Dissinger: Treating Smitty Ackerley/Extender: Egbert Garibaldi, EUNICE Weeks in Treatment: 0 Non-Wound Condition: Condition: Rash / Dermatitis Location: Abdomen Side: Bilateral Photos Notes rash like area noted to under abdominal fold from left hip to right hip- redness moisten noted. Electronic Signature(s) Signed: 10/17/2021 4:15:16 PM By: Dellie Catholic RN Entered By: Dellie Catholic on 10/17/2021 08:20:40 -------------------------------------------------------------------------------- Pain Assessment Details Patient Name: Date of Service: Zbikowski, [name] 10/17/2021 7:30 A M Medical Record Number: [id number] Patient Account Number: [id number] Date of Birth/Sex: Treating RN: 19-Jun-1961 (61 y.o. [name] Primary Care Merideth Bosque: [name] Other Clinician: Referring Daiquan Resnik: Treating Santrice Muzio/Extender: Egbert Garibaldi, EUNICE Weeks in Treatment: 0 Active Problems Location of Pain Severity and Description of Pain Patient Has Paino Yes Site Locations Pain Location: Generalized Pain, Pain in Ulcers Rate the pain. Current Pain Level: 6 Worst Pain Level: 10 Least Pain Level: 0 Tolerable Pain Level: 8 Character of Pain Describe the Pain: Aching, Dull, Heavy, Sharp Pain Management and Medication Current Pain Management: Medication: No Cold Application: No Rest:  No Massage: No Activity: No T.E.N.S.: No Heat Application: No Leg drop or elevation: No Is the Current Pain Management Adequate: Adequate How does your wound impact your activities of daily livingo Sleep: No Bathing: No Appetite: No Relationship With Others: No Bladder Continence: No Emotions: No Bowel Continence: No Work: No Toileting: No Drive: No Dressing: No Hobbies: No Engineer, maintenance) Signed: 10/17/2021 4:47:34 PM By: Deon Pilling RN, BSN Entered By: Deon Pilling on 10/17/2021  08:03:05 -------------------------------------------------------------------------------- Patient/Caregiver Education Details Patient Name: Date of Service: [place], [name] 2/6/2023andnbsp7:30 A M Medical Record Number: [id number] Patient Account Number: [id number] Date of Birth/Gender: Treating RN: 01-03-1961 (61 y.o. [name] Primary Care Physician: [name] Other Clinician: Referring Physician: Treating Physician/Extender: Bosie Clos Weeks in Treatment: 0 Education Assessment Education Provided To: Patient Education Topics Provided Elevated Blood Sugar/ Impact on Healing: Handouts: Elevated Blood Sugars: How Do They Affect Wound Healing Methods: Explain/Verbal Responses: Reinforcements needed Alger: o Handouts: Welcome T [facility] o Methods: Explain/Verbal Responses: Reinforcements needed Wound/Skin Impairment: Handouts: Caring for Your Ulcer, Skin Care Do's and Dont's Methods: Explain/Verbal Responses: Reinforcements needed Electronic Signature(s) Signed: 10/17/2021 4:47:34 PM By: Deon Pilling RN, BSN Entered By: Deon Pilling on 10/17/2021 08:04:43 -------------------------------------------------------------------------------- Wound Assessment Details Patient Name: Date of Service: Manni, [name] 10/17/2021 7:30 A M Medical Record Number: [id number] Patient Account Number:  [id number] Date of Birth/Sex: Treating RN: Aug 26, 1961 (61 y.o. [name] Primary Care Mckaylah Bettendorf: [name] Other Clinician: Referring Sundae Maners: Treating Raymon Schlarb/Extender: Egbert Garibaldi, EUNICE Weeks in Treatment: 0 Wound Status Wound Number: 25 Primary Dehisced Wound Etiology: Wound Location: Proximal, Midline Abdomen - Lower Quadrant Wound Open Wounding Event: Gradually Appeared Status: Date Acquired: 09/06/2021 Comorbid Cataracts, Lymphedema, Asthma, Congestive Heart Failure, Weeks Of Treatment: 0 History: Coronary Artery Disease, Hypertension, Peripheral Arterial Disease, Clustered Wound: No Peripheral Venous Disease, Type II Diabetes, Rheumatoid Arthritis, Neuropathy Photos Wound Measurements Length: (cm) 5.4 Width: (cm) 2.5 Depth: (cm) 0.2 Area: (cm) 10.603 Volume: (cm) 2.121 % Reduction in Area: 0% % Reduction in Volume: 0% Epithelialization: None Tunneling: No Undermining: No Wound Description Classification: Full Thickness Without Exposed Support Structu Wound Margin: Distinct, outline attached Exudate Amount: Medium Exudate Type: Serosanguineous Exudate Color: red, brown res Foul Odor After Cleansing: No Slough/Fibrino Yes Wound Bed Granulation Amount: Large (67-100%) Exposed Structure Granulation Quality: Red, Pink Fascia Exposed: No Necrotic Amount: Small (1-33%) Fat Layer (Subcutaneous Tissue) Exposed: Yes Necrotic Quality: Adherent Slough Tendon Exposed: No Muscle Exposed: No Joint Exposed: No Bone Exposed: No Treatment Notes Wound #13 (Abdomen - Lower Quadrant) Wound Laterality: Midline, Proximal Cleanser Soap and Water Discharge Instruction: May shower and wash wound with dial antibacterial soap and water prior to dressing change. Byram Ancillary Kit - 15 Day Supply Discharge Instruction: Use supplies as instructed; Kit contains: (15) Saline Bullets; (15) 3x3 Gauze; 15 pr Gloves Peri-Wound Care Skin  Prep Discharge Instruction: Use skin prep as directed Zinc Oxide Ointment 30g tube Discharge Instruction: Apply Zinc Oxide as needed to periwound for skin redness, maceration, or irritation. Topical Primary Dressing Hydrofera [place], 4x4.75 (in/in) Secondary Dressing Secured With Compression Wrap Compression Stockings Add-Ons Electronic Signature(s) Signed: 10/17/2021 4:15:16 PM By: Dellie Catholic RN Signed: 10/17/2021 4:47:34 PM By: Deon Pilling RN, BSN Entered By: Deon Pilling on 10/17/2021 08:08:07 -------------------------------------------------------------------------------- Wound Assessment Details Patient Name: Date of Service: Kurtzman, [name] 10/17/2021 7:30 A M Medical Record Number: [id number] Patient Account Number: [id number] Date of Birth/Sex: Treating RN: 06-22-1961 (61 y.o. [name] Primary Care Crisanto Nied: [name] Other Clinician: Referring Adalid Beckmann: Treating Aren Pryde/Extender: Egbert Garibaldi, EUNICE Weeks in Treatment: 0 Wound Status Wound Number: 14 Primary Dehisced Wound Etiology: Wound Location: Distal, Medial Abdomen - Lower Quadrant Wound Open Wounding Event: Gradually Appeared Status: Date Acquired: 09/06/2021 Comorbid Cataracts, Lymphedema, Asthma, Congestive Heart Failure, Weeks Of Treatment: 0 History: Coronary Artery Disease,  Hypertension, Peripheral Arterial Disease, Clustered Wound: Yes Peripheral Venous Disease, Type II Diabetes, Rheumatoid Arthritis, Neuropathy Photos Wound Measurements Length: (cm) 4 Width: (cm) 0.5 Depth: (cm) 0.1 Area: (cm) 1.571 Volume: (cm) 0.157 % Reduction in Area: % Reduction in Volume: Epithelialization: None Tunneling: No Undermining: No Wound Description Classification: Full Thickness Without Exposed Support Str Exudate Amount: Medium Exudate Type: Serosanguineous Exudate Color: red, brown uctures Wound Bed Granulation Amount: Large  (67-100%) Exposed Structure Granulation Quality: Red Fascia Exposed: No Necrotic Amount: Small (1-33%) Fat Layer (Subcutaneous Tissue) Exposed: Yes Necrotic Quality: Adherent Slough Tendon Exposed: No Muscle Exposed: No Joint Exposed: No Bone Exposed: No Treatment Notes Wound #14 (Abdomen - Lower Quadrant) Wound Laterality: Medial, Distal Cleanser Soap and Water Discharge Instruction: May shower and wash wound with dial antibacterial soap and water prior to dressing change. Byram Ancillary Kit - 15 Day Supply Discharge Instruction: Use supplies as instructed; Kit contains: (15) Saline Bullets; (15) 3x3 Gauze; 15 pr Gloves Peri-Wound Care Skin Prep Discharge Instruction: Use skin prep as directed Zinc Oxide Ointment 30g tube Discharge Instruction: Apply Zinc Oxide as needed to periwound for skin redness, maceration, or irritation. Topical Primary Dressing Hydrofera [place], 4x4.75 (in/in) Secondary Dressing Secured With Compression Wrap Compression Stockings Add-Ons Electronic Signature(s) Signed: 10/17/2021 4:15:16 PM By: Dellie Catholic RN Entered By: Dellie Catholic on 10/17/2021 08:09:09 -------------------------------------------------------------------------------- Wound Assessment Details Patient Name: Date of Service: Shaff, [name] 10/17/2021 7:30 A M Medical Record Number: [id number] Patient Account Number: [id number] Date of Birth/Sex: Treating RN: 09/02/1961 (61 y.o. [name] Daekwon Beswick: [name] Other Clinician: Referring Halston Kintz: Treating Rebeka Kimble/Extender: Egbert Garibaldi, EUNICE Weeks in Treatment: 0 Wound Status Wound Number: 15 Primary Pressure Ulcer Etiology: Wound Location: Coccyx Wound Open Wounding Event: Pressure Injury Status: Date Acquired: 09/14/2021 Comorbid Cataracts, Lymphedema, Asthma, Congestive Heart Failure, Weeks Of Treatment: 0 History: Coronary Artery  Disease, Hypertension, Peripheral Arterial Disease, Clustered Wound: No Peripheral Venous Disease, Type II Diabetes, Rheumatoid Arthritis, Neuropathy Photos Wound Measurements Length: (cm) 1.4 Width: (cm) 0.9 Depth: (cm) 0.1 Area: (cm) 0.99 Volume: (cm) 0.099 % Reduction in Area: % Reduction in Volume: Epithelialization: None Tunneling: No Undermining: No Wound Description Classification: Unstageable/Unclassified Exudate Amount: Medium Exudate Type: Serosanguineous Exudate Color: red, brown Foul Odor After Cleansing: No Slough/Fibrino Yes Wound Bed Granulation Amount: None Present (0%) Exposed Structure Necrotic Amount: Large (67-100%) Fascia Exposed: No Necrotic Quality: Adherent Slough Fat Layer (Subcutaneous Tissue) Exposed: Yes Tendon Exposed: No Muscle Exposed: No Joint Exposed: No Bone Exposed: No Treatment Notes Wound #15 (Coccyx) Cleanser Soap and Water Discharge Instruction: May shower and wash wound with dial antibacterial soap and water prior to dressing change. Byram Ancillary Kit - 15 Day Supply Discharge Instruction: Use supplies as instructed; Kit contains: (15) Saline Bullets; (15) 3x3 Gauze; 15 pr Gloves Peri-Wound Care Skin Prep Discharge Instruction: Use skin prep as directed Zinc Oxide Ointment 30g tube Discharge Instruction: Apply Zinc Oxide as needed to periwound for skin redness, maceration, or irritation. Topical Primary Dressing Hydrofera [place], 4x4.75 (in/in) Secondary Dressing Secured With Compression Wrap Compression Stockings Add-Ons Electronic Signature(s) Signed: 10/17/2021 4:15:16 PM By: Dellie Catholic RN Entered By: Dellie Catholic on 10/17/2021 08:17:09 -------------------------------------------------------------------------------- Wound Assessment Details Patient Name: Date of Service: [place], [name] 10/17/2021 7:30 A M Medical Record Number: [id number] Patient Account Number:  [id number] Date of Birth/Sex: Treating RN: 23-Jul-1961 (61 y.o. [name] Franky Reier: [name] Other Clinician:  Referring Latandra Loureiro: Treating Wilhelmina Hark/Extender: Egbert Garibaldi, EUNICE Weeks in Treatment: 0 Wound Status Wound Number: 16 Primary Abrasion Etiology: Wound Location: Left Labia Wound Open Wounding Event: Gradually Appeared Status: Date Acquired: 05/12/2021 Comorbid Cataracts, Lymphedema, Asthma, Congestive Heart Failure, Weeks Of Treatment: 0 History: Coronary Artery Disease, Hypertension, Peripheral Arterial Disease, Clustered Wound: No Peripheral Venous Disease, Type II Diabetes, Rheumatoid Arthritis, Neuropathy Photos Wound Measurements Length: (cm) 2.9 Width: (cm) 1.6 Depth: (cm) 0.1 Area: (cm) 3.644 Volume: (cm) 0.364 % Reduction in Area: 0% % Reduction in Volume: 0% Epithelialization: None Tunneling: No Undermining: No Wound Description Classification: Full Thickness Without Exposed Support Structures Wound Margin: Distinct, outline attached Exudate Amount: Medium Exudate Type: Serosanguineous Exudate Color: red, brown Foul Odor After Cleansing: No Slough/Fibrino Yes Wound Bed Granulation Amount: Large (67-100%) Exposed Structure Granulation Quality: Pink, Pale Fascia Exposed: No Necrotic Amount: Small (1-33%) Fat Layer (Subcutaneous Tissue) Exposed: Yes Necrotic Quality: Adherent Slough Tendon Exposed: No Muscle Exposed: No Joint Exposed: No Bone Exposed: No Treatment Notes Wound #16 (Labia) Wound Laterality: Left Cleanser Soap and Water Discharge Instruction: May shower and wash wound with dial antibacterial soap and water prior to dressing change. Byram Ancillary Kit - 15 Day Supply Discharge Instruction: Use supplies as instructed; Kit contains: (15) Saline Bullets; (15) 3x3 Gauze; 15 pr Gloves Peri-Wound Care Skin Prep Discharge Instruction: Use skin prep as directed Zinc Oxide Ointment 30g  tube Discharge Instruction: Apply Zinc Oxide as needed to periwound for skin redness, maceration, or irritation. Topical Primary Dressing Hydrofera [place], 4x4.75 (in/in) Secondary Dressing Secured With Compression Wrap Compression Stockings Add-Ons Electronic Signature(s) Signed: 10/17/2021 4:15:16 PM By: Dellie Catholic RN Signed: 10/17/2021 4:47:34 PM By: Deon Pilling RN, BSN Entered By: Dellie Catholic on 10/17/2021 08:19:13 -------------------------------------------------------------------------------- Wound Assessment Details Patient Name: Date of Service: [place], [name] 10/17/2021 7:30 A M Medical Record Number: [id number] Patient Account Number: [id number] Date of Birth/Sex: Treating RN: 07-23-1961 (61 y.o. 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 Event: Gradually Appeared Etiology: Date Acquired: 09/05/2021 Wound Open Weeks Of Treatment: 0 Status: Clustered Wound: Yes Comorbid Cataracts, Lymphedema, Asthma, Congestive Heart Failure, History: Coronary Artery Disease, Hypertension, Peripheral Arterial Disease, Peripheral Venous Disease, Type II Diabetes, Rheumatoid Arthritis, Neuropathy Photos Wound Measurements Length: (cm) 8.8 Width: (cm) 7.5 Depth: (cm) 0.1 Clustered Quantity: 5 Area: (cm) 51.836 Volume: (cm) 5.184 % Reduction in Area: 0% % Reduction in Volume: 0% Epithelialization: Small (1-33%) Tunneling: No Undermining: No Wound Description Classification: Full Thickness Without Exposed Support Structures Wound Margin: Distinct, outline attached Exudate Amount: Medium Exudate Type: Serosanguineous Exudate Color:  red, brown Foul Odor After Cleansing: No Slough/Fibrino Yes Wound Bed Granulation Amount: Large (67-100%) Exposed Structure Granulation Quality: Pink, Pale Fascia Exposed: No Necrotic Amount: Small (1-33%) Fat Layer (Subcutaneous Tissue) Exposed: Yes Necrotic Quality: Adherent Slough Tendon Exposed: No Muscle Exposed: No Joint Exposed: No Bone Exposed: No Treatment Notes Wound #17 (Upper Leg) Wound Laterality: Left, Posterior Cleanser Soap and Water Discharge Instruction: May shower and wash wound with dial antibacterial soap and water prior to dressing change. Byram Ancillary Kit - 15 Day Supply Discharge Instruction: Use supplies as instructed; Kit contains: (15) Saline Bullets; (15) 3x3 Gauze; 15 pr Gloves Peri-Wound Care Skin Prep Discharge Instruction: Use skin prep  as directed Zinc Oxide Ointment 30g tube Discharge Instruction: Apply Zinc Oxide as needed to periwound for skin redness, maceration, or irritation. Topical Primary Dressing Hydrofera [place], 4x4.75 (in/in) Secondary Dressing Secured With Compression Wrap Compression Stockings Add-Ons Electronic Signature(s) Signed: 10/17/2021 4:15:16 PM By: Dellie Catholic RN Signed: 10/17/2021 4:47:34 PM By: Deon Pilling RN, BSN Entered By: Dellie Catholic on 10/17/2021 08:19:48 -------------------------------------------------------------------------------- [place] Details Patient Name: Date of Service: Suchecki, [name] 10/17/2021 7:30 A M Medical Record Number: [id number] Patient Account Number: [id number] Date of Birth/Sex: Treating RN: June 26, 1961 (61 y.o. [name] Abegail Kloeppel: [name] Other Clinician: Referring Akeelah Seppala: Treating Kimisha Eunice/Extender: Egbert Garibaldi, EUNICE Weeks in Treatment: 0 Vital Signs Time Taken: 07:48 Reference Range: 80 - 120 mg / dl Height (in): 16 Source: Camera operator) Signed: 10/17/2021 4:47:34 PM  By: Deon Pilling RN, BSN Entered By: Deon Pilling on 10/17/2021 07:49:22

## 2021-10-17 NOTE — Progress Notes (Signed)
[name] ([id number]) Visit Report for 10/17/2021 Chief Complaint Document Details Patient Name: Date of Service: [name], [name] 10/17/2021 7:30 A M Medical Record Number: [id number] Patient Account Number: [id number] Date of Birth/Sex: Treating RN: 1961/06/23 (61 y.o. [name] Primary Care Provider: [name] Other Clinician: Referring Provider: Treating Provider/Extender: Bosie Clos Weeks in Treatment: 0 Information Obtained from: Patient Chief Complaint 10/17/2021; multiple scattered wounds to the abdomen, posterior left leg, left labia and sacrum Electronic Signature(s) Signed: 10/17/2021 9:14:51 AM By: Kalman Shan DO Entered By: Kalman Shan on 10/17/2021 09:09:31 -------------------------------------------------------------------------------- Debridement Details Patient Name: Date of Service: [name], [name] M. 10/17/2021 7:30 A M Medical Record Number: [id number] Patient Account Number: [id number] Date of Birth/Sex: Treating RN: [birth year] (61 y.o. [name] Primary Care Provider: [name] Other Clinician: Referring Provider: Treating Provider/Extender: Egbert Garibaldi, EUNICE Weeks in Treatment: 0 Debridement Performed for Assessment: Wound #13 Proximal,Midline Abdomen - Lower Quadrant Performed By: Clinician Deon Pilling, RN Debridement Type: Chemical/Enzymatic/Mechanical Agent Used: gauze and wound cleanser Level of Consciousness (Pre-procedure): Awake and Alert Pre-procedure Verification/Time Out No Taken: Bleeding: None Response to Treatment: Procedure was tolerated well Level of Consciousness (Post- Awake and Alert procedure): Post Debridement Measurements of Total Wound Length: (cm) 5.4 Width: (cm) 2.5 Depth: (cm) 0.2 Volume: (cm) 2.121 Character of Wound/Ulcer Post Debridement: Stable Post Procedure Diagnosis Same as Pre-procedure Electronic Signature(s) Signed: 10/17/2021  9:14:51 AM By: Kalman Shan DO Signed: 10/17/2021 4:47:34 PM By: Deon Pilling RN, BSN Entered By: Deon Pilling on 10/17/2021 08:45:24 -------------------------------------------------------------------------------- Debridement Details Patient Name: Date of Service: [name], [name] M. 10/17/2021 7:30 A M Medical Record Number: [id number] Patient Account Number: [id number] Date of Birth/Sex: Treating RN: 01-Feb-1961 (61 y.o. [name] Primary Care Provider: [name] Other Clinician: Referring Provider: Treating Provider/Extender: Egbert Garibaldi, EUNICE Weeks in Treatment: 0 Debridement Performed for Assessment: Wound #14 Distal,Medial Abdomen - Lower Quadrant Performed By: Clinician Deon Pilling, RN Debridement Type: Chemical/Enzymatic/Mechanical Agent Used: gauze and wound cleanser Level of Consciousness (Pre-procedure): Awake and Alert Pre-procedure Verification/Time Out No Taken: Bleeding: None Response to Treatment: Procedure was tolerated well Level of Consciousness (Post- Awake and Alert procedure): Post Debridement Measurements of Total Wound Length: (cm) 4 Width: (cm) 0.5 Depth: (cm) 0.1 Volume: (cm) 0.157 Character of Wound/Ulcer Post Debridement: Stable Post Procedure Diagnosis Same as Pre-procedure Electronic Signature(s) Signed: 10/17/2021 9:14:51 AM By: Kalman Shan DO Signed: 10/17/2021 4:47:34 PM By: Deon Pilling RN, BSN Entered By: Deon Pilling on 10/17/2021 08:45:43 -------------------------------------------------------------------------------- Debridement Details Patient Name: Date of Service: [name], [name] M. 10/17/2021 7:30 A M Medical Record Number: [id number] Patient Account Number: [id number] Date of Birth/Sex: Treating RN: August 21, 1961 (61 y.o. [name] Primary Care Provider: [name] Other Clinician: Referring Provider: Treating Provider/Extender: Egbert Garibaldi, EUNICE Weeks in  Treatment: 0 Debridement Performed for Assessment: Wound #15 Coccyx Performed By: Clinician Deon Pilling, RN Debridement Type: Chemical/Enzymatic/Mechanical Agent Used: gauze and wound cleanser Level of Consciousness (Pre-procedure): Awake and Alert Pre-procedure Verification/Time Out No Taken: Bleeding: None Response to Treatment: Procedure was tolerated well Level of Consciousness (Post- Awake and Alert procedure): Post Debridement Measurements of Total Wound Length: (cm) 1.4 Stage: Unstageable/Unclassified Width: (cm) 0.9 Depth: (cm) 0.1 Volume: (cm) 0.099 Character of Wound/Ulcer Post Debridement: Stable Post Procedure Diagnosis Same as Pre-procedure Electronic Signature(s) Signed: 10/17/2021 9:14:51 AM By: Kalman Shan DO Signed: 10/17/2021 4:47:34 PM By: Deon Pilling RN, BSN Entered By: Deon Pilling  on 10/17/2021 08:46:48 -------------------------------------------------------------------------------- Debridement Details Patient Name: Date of Service: [name], [name] 10/17/2021 7:30 A M Medical Record Number: [id number] Patient Account Number: [id number] Date of Birth/Sex: Treating RN: Jan 28, 1961 (61 y.o. [name] Primary Care Provider: [name] Other Clinician: Referring Provider: Treating Provider/Extender: Egbert Garibaldi, EUNICE Weeks in Treatment: 0 Debridement Performed for Assessment: Wound #16 Left Labia Performed By: Clinician Deon Pilling, RN Debridement Type: Chemical/Enzymatic/Mechanical Agent Used: gauze and wound cleanser Level of Consciousness (Pre-procedure): Awake and Alert Pre-procedure Verification/Time Out No Taken: Bleeding: None Response to Treatment: Procedure was tolerated well Level of Consciousness (Post- Awake and Alert procedure): Post Debridement Measurements of Total Wound Length: (cm) 2.9 Width: (cm) 1.6 Depth: (cm) 0.1 Volume: (cm) 0.364 Character of Wound/Ulcer Post Debridement:  Stable Post Procedure Diagnosis Same as Pre-procedure Electronic Signature(s) Signed: 10/17/2021 9:14:51 AM By: Kalman Shan DO Signed: 10/17/2021 4:47:34 PM By: Deon Pilling RN, BSN Entered By: Deon Pilling on 10/17/2021 08:47:10 -------------------------------------------------------------------------------- Debridement Details Patient Name: Date of Service: [name], [name] M. 10/17/2021 7:30 A M Medical Record Number: [id number] Patient Account Number: [id number] Date of Birth/Sex: Treating RN: 1961/02/20 (61 y.o. [name] Primary Care Provider: [name] Other Clinician: Referring Provider: Treating Provider/Extender: Egbert Garibaldi, EUNICE Weeks in Treatment: 0 Debridement Performed for Assessment: Wound #17 Left,Posterior Upper Leg Performed By: Clinician Deon Pilling, RN Debridement Type: Chemical/Enzymatic/Mechanical Agent Used: gauze and wound cleanser Severity of Tissue Pre Debridement: Fat layer exposed Level of Consciousness (Pre-procedure): Awake and Alert Pre-procedure Verification/Time Out No Taken: Bleeding: None Response to Treatment: Procedure was tolerated well Level of Consciousness (Post- Awake and Alert procedure): Post Debridement Measurements of Total Wound Length: (cm) 8.5 Width: (cm) 7.5 Depth: (cm) 0.1 Volume: (cm) 5.007 Character of Wound/Ulcer Post Debridement: Stable Severity of Tissue Post Debridement: Fat layer exposed Post Procedure Diagnosis Same as Pre-procedure Electronic Signature(s) Signed: 10/17/2021 9:14:51 AM By: Kalman Shan DO Signed: 10/17/2021 4:47:34 PM By: Deon Pilling RN, BSN Entered By: Deon Pilling on 10/17/2021 08:47:41 -------------------------------------------------------------------------------- HPI Details Patient Name: Date of Service: [name], [name] M. 10/17/2021 7:30 A M Medical Record Number: [id number] Patient Account Number: [id number] Date of Birth/Sex: Treating  RN: 30-Jan-1961 (61 y.o. [name] Primary Care Provider: [name] Other Clinician: Referring Provider: Treating Provider/Extender: Bosie Clos Weeks in Treatment: 0 History of Present Illness HPI Description: 07/23/2019 on evaluation today patient presents with a myriad of wounds noted at multiple locations over her right heel, right lower extremity, and abdominal region. She also has bilateral lower extremity lymphedema which is quite significant as well. Incidentally she also has congestive heart failure and hypertension. She also is obese. With that being said I think all this is contributing as well to her lower extremity edema which is very much uncontrolled. It seems like its been at least several months since she is worn any compression according to what she tells me. With that being said I am not sure exactly when that would have been. She does have fibrotic changes in the lower extremity secondary to lymphedema worse on the left than the right. She did show me pictures of wound she had on the anterior portion of her shin which was quite significant fortunately that has healed. These issues have been intermittent at this time. Again I think that with appropriate compression therapy she may actually be doing better than what we are seeing at this point but again I am not really sure  that she is ever been extremely compliant with that. Fortunately there is no signs of active infection at this time. No fever chills noted. As far as the abdominal ulcer she initially had one wound that she thinks may have been a bug bite. Subsequently she was put a dressing on this and she states that the tape pulled skin off on other locations causing other wounds that have not healed that is been about 3 months. The wounds on her lower extremities have been intermittent over 3 years. This includes the heel. 11/19; this is a patient that I have not seen previously. She  was admitted to our clinic last week with multiple wounds including several superficial circular areas on her abdomen predominantly right upper quadrant. Also 1 in her umbilicus. She has an area on her right lateral malleolus which was new today. She has bilateral lower extremity edema with very significant stasis dermatitis on the left anterior tibial area. She has tightly adherent skin in her lower extremities probably secondary to cutaneous fibrosis in the area. We put her in compression last week. She comes in with the dressings reasonably saturated. 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 She has multiple areas on her arms from scratching. 12/3; the patient I saw for the first time 2 weeks ago. She has 3 small circular areas on her abdomen 2 in the right upper quadrant one on her umbilicus. We have been using silver alginate to this area. She also had an area on her right lateral malleolus and right heel and right medial malleolus. We have been using silver alginate here under compression. She does not have an open area on the left leg today. We are going to order her stockings for the left leg. She clearly has chronic lymphedema in these areas. X-ray of the foot from 10/20 did not show any fracture or dislocation. The heel wound was noted there was no evidence of osteomyelitis She complains of generalized pruritus. 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 She has longstanding lymphedema. Although she does not have known liver disease I looked at his CT scan of the abdomen from February of this year that showed hepatic steatosis 12/11; patient still has no open area on the left leg we transitioned her into a stocking she had. Her stockings from [employer] are  supposed to be arriving later today which will be the stockings of choice. The only wound remaining on the right is the right heel 2 small superficial open areas. Everything else is well on its way to healing in the right leg few small excoriations. She has 1 major area remaining on her abdomen the rest seem to be healing 12/22 patient still has no open area on the left leg and she is still in a stocking. She had still 2 open areas on the tip of her right heel. These look like pressure related areas although the patient is not really certain how this is happening. She has an open heeled shoe that we provided. Still has 1 open area on the right lateral abdomen The patient has complaints of generalized pruritus. She has multiple excoriated areas on her abdomen that of close over her arms or thighs which I think are from scratching. She tells me that she has never had a basic work-up for this  which might include basic lab work, liver functions, kidney functions, thyroid etc. She might also benefit from a dermatologist 12/29; the patient has a deeper larger more painful wound on the tip of her right heel. Again these look like pressure ulcers although she wears an open toed heel pads or heel at night to prevent it from hitting the mattress etc. They tell me and remind me that this is been present on and off for about 2 years that has closed over but then will reopen. I did look over her lab work that was available in [place]. She does not have elevated liver function tests or creatinine. I was not able to see a TSH. This was in response to her complaints of generalized itching and a itch/scratch cycle. I also noted that she is on OxyContin and oxycodone and of course narcotics can cause generalized pruritus as a side effect Readmission: History From [place], [place] Last Week: 03/29/2021 this is a patient who presents for initial evaluation here in the clinic though I have seen her 1 time  previously in 2020 this was in [place]. That was actually her initial visit therefore a heel ulceration. Subsequently that did not healing she actually saw me the 1 time in November and then subsequently saw Dr. Dellia Nims through the end of December where she apparently was healed. 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 This is on the right. With that being said right now she has a basically region on the lateral portion of the ray site where she is applying more pressure especially as her foot seems to be starting to turn in and this has become an increasingly significant issue for her to be honest. She does see Dr. Sharol Given and Dr. Sharol Given has had her on doxycycline for quite a bit of time here. Subsequently she is just now wrapping that out. I think that she probably would be benefited by continue the doxycycline for a time while we can work through what we need to do with things here. She does have evidence of osteomyelitis based on what I saw on the MRI today. This obviously is unfortunate and definitely not something that she was hoping to hear. With that being said I do believe that she would potentially be a strong candidate for hyperbaric oxygen therapy. Also believe she could be a candidate for total contact cast though we have to be cautious due to the fact that how her foot is bending inward I think that this is good to be a little bit of a concern. We will definitely have to keeping a close eye on things. She does have a history of diabetes mellitus type 2. She also other than the amputation has a medical history positive for hypertension. She has never undergone hyperbaric oxygen therapy previously I did show her the chamber we did talk a little bit about it today as well. Admission [facility]: o 04/06/2021 Patient presents here in [place] today for evaluation. Again she is  establishing care here after I saw her last week in [place]. Obviously I think that she is doing extremely well at this point which is great news and in general I am extremely pleased with where things stand from the standpoint of the appearance of the wound. Obviously this is not significantly smaller but does look a lot cleaner I think using the  Hydrofera Blue has been of benefit over the past week. Nonetheless she still has a wound with issues with pressure here which I think is good to be an ongoing issue. Also think that the osteomyelitis which is chronic is also getting ongoing issue. She does want to try to do what she can to prevent this from worsening and ending with a below-knee amputation. T that end I do think that getting her into the hyperbaric oxygen chamber would be what we need to do. She has recently been seen by cardiology o and subsequently well as far as that is concerned. Her ejection fraction was appropriate for hyperbarics and everything seems to be doing great in that regard. She has not had a recent chest x-ray she is a former smoker around 15 years ago she quit. Nonetheless I do believe with her asthma we do want to do a chest x-ray just to make sure everything is okay before proceeding with the hyperbarics although we can go ahead and see about getting the approval. I also think she is going require some sharp debridement and around this wound we also have to contact her insurance for prior approval on this as well. 04/13/2021 upon evaluation today patient appears to be doing a little bit better in regard to her leg in fact the swelling is dramatically better. Very pleased with where things stand in that regard. Fortunately there does not appear to be any signs of active infection at this time. No fevers, chills, nausea, vomiting, or diarrhea. 04/20/2021 upon evaluation today patient appears to be doing decently well in regard to her foot. There is some need for sharp  debridement here today. With that being said we will get a go ahead and proceed with that today as the foot is becoming somewhat macerated with the overhanging callus that is definitely not we want to see. With that being said the patient does have an issue here as well with a boil in the perineal region unfortunately that is an issue for her today as well. I did have a look at that as well. We are still working on dealing with insurance as far as getting hyperbarics approved. 04/27/2021 upon evaluation today patient appears to be doing somewhat poorly in general compared to where she has been. At this point she is having a lot of swelling which is the main concerning thing that I am seeing. There does not appear to be any signs of infection currently which is good news but at the same time I do feel like that she is a lot more swollen than she was even last week and is showing how much she is weeping in regard to the right lower leg. She also has a blister on the left breast although is not an open wound at this time. She continues to have the area in the perineum as well. 05/04/2021 upon evaluation today patient appears to actually be doing decently well in regard to her wound on the foot. Fortunately there is no signs of active infection at this time. No fevers, chills, nausea, vomiting, or diarrhea. Unfortunately she does have an area on the left breast which is actually appearing to be a burn based on what I see physically. She does note that she uses rice bags for areas in general and may have left one in that area not realizing it was burning her as she does not really have much feeling. I think this is very probable based on what I  am seeing. For that reason working to treat this as such I do think we need to loosen up a lot of the necrotic tissue here. I Am going tosend in a prescription for Santyl for the patient. 9/1; patient presents for HBO today but cannot do treatment due to wheezing and  feeling short of breath when laying down. She was set up as a doctor visit today since she was not able to do HBO. She states she feels okay overall and started having a dry cough over the past couple days. She has not tested herself for COVID. She denies fever/chills or sputum production. She thinks her symptoms are related to allergies. She has been using silver alginate to her right plantar foot wound. She has not been using Santyl to the breast wound because she reports forgetting to do this. She uses zinc oxide to her perennial wound. She currently denies systemic signs of infection. 9/8; patient presents for follow-up. She has been a unable to do HBO treatments for the past week due to her back pain. She is currently taking Flexeril to address this issue. She reports no issues with her compression wrap. She has been putting Santyl on the left breast wound and Monistat cream to the perennial wound. She denies signs of infection. 9/15; patient presents for follow-up. Again she is unable to do HBO treatment today due to sinus pain. She has 2 new wounds to her right foot which she is unaware of. She has been putting Santyl on the left breast wound and zinc oxide to the perineal wound. She currently denies signs of infection. Readmission 10/17/2021 Ms. Morgann [place] is a 61 year old female with a past medical history of type 2 diabetes, osteomyelitis status post right BKA and morbid obesity that presents to the clinic for multiple scattered open wounds to her body. These are located to the abdomen, left posterior leg and sacral region. She has been using mupirocin ointment, Neosporin and zinc oxide to the wound beds. She has been started on Bactrim and Keflex by her primary care physician and states she has several days left to complete the course. She currently denies systemic signs of infection. Electronic Signature(s) Signed: 10/17/2021 9:14:51 AM By: Kalman Shan DO Entered By: Kalman Shan  on 10/17/2021 09:09:42 -------------------------------------------------------------------------------- Physical Exam Details Patient Name: Date of Service: [name], [name] M. 10/17/2021 7:30 A M Medical Record Number: [id number] Patient Account Number: [id number] Date of Birth/Sex: Treating RN: July 21, 1961 (61 y.o. [name] Primary Care Provider: [name] Other Clinician: Referring Provider: Treating Provider/Extender: Egbert Garibaldi, EUNICE Weeks in Treatment: 0 Constitutional respirations regular, non-labored and within target range for patient.Marland Kitchen Psychiatric pleasant and cooperative. Notes Abdomen: Open wound to previous surgical site. Appears excoriated. Left posterior leg scattered open wounds with increased erythema to the periwound. Coccyx: Open wound with nonviable tissue throughout Left labia: Open wound with granulation tissue present. Electronic Signature(s) Signed: 10/17/2021 9:14:51 AM By: Kalman Shan DO Entered By: Kalman Shan on 10/17/2021 09:10:00 -------------------------------------------------------------------------------- Physician Orders Details Patient Name: Date of Service: Vidrine, [name] M. 10/17/2021 7:30 A M Medical Record Number: [id number] Patient Account Number: [id number] Date of Birth/Sex: Treating RN: March 11, 1961 (61 y.o. [name] Primary Care Provider: [name] Other Clinician: Referring Provider: Treating Provider/Extender: Egbert Garibaldi, EUNICE Weeks in Treatment: 0 Verbal / Phone Orders: No Diagnosis Coding Follow-up Appointments ppointment in 1 week. - with Dr. Heber [place] Return A Bathing/ Shower/ Hygiene May shower and wash wound with  soap and water. Edema Control - Lymphedema / SCD / Other Bilateral Lower Extremities Elevate legs to the level of the heart or above for 30 minutes daily and/or when sitting, a frequency of: - 3-4 times a day throughout the day Patient to wear  own compression stockings every day. - left leg daily Exercise regularly Moisturize legs daily. - left leg every night before bed. Non Wound Condition Other Extremity - abdominal skin folds. pply the following to affected area as directed: - continue the antifungal powder under skin folds for rash like areas. A Wound Treatment Wound #13 - Abdomen - Lower Quadrant Wound Laterality: Midline, Proximal Cleanser: Soap and Water 1 x Per Day/30 Days Discharge Instructions: May shower and wash wound with dial antibacterial soap and water prior to dressing change. Cleanser: [place] Ancillary Kit - 15 Day Supply (DME) (Generic) 1 x Per Day/30 Days Discharge Instructions: Use supplies as instructed; Kit contains: (15) Saline Bullets; (15) 3x3 Gauze; 15 pr Gloves Peri-Wound Care: Skin Prep (DME) (Generic) 1 x Per Day/30 Days Discharge Instructions: Use skin prep as directed Peri-Wound Care: Zinc Oxide Ointment 30g tube 1 x Per Day/30 Days Discharge Instructions: Apply Zinc Oxide as needed to periwound for skin redness, maceration, or irritation. Prim Dressing: Hydrofera [place], 4x4.75 (in/in) (DME) (Generic) 1 x Per Day/30 Days ary Wound #14 - Abdomen - Lower Quadrant Wound Laterality: Medial, Distal Cleanser: Soap and Water 1 x Per Day/30 Days Discharge Instructions: May shower and wash wound with dial antibacterial soap and water prior to dressing change. Cleanser: [place] Ancillary Kit - 15 Day Supply (DME) (Generic) 1 x Per Day/30 Days Discharge Instructions: Use supplies as instructed; Kit contains: (15) Saline Bullets; (15) 3x3 Gauze; 15 pr Gloves Peri-Wound Care: Skin Prep (DME) (Generic) 1 x Per Day/30 Days Discharge Instructions: Use skin prep as directed Peri-Wound Care: Zinc Oxide Ointment 30g tube 1 x Per Day/30 Days Discharge Instructions: Apply Zinc Oxide as needed to periwound for skin redness, maceration, or irritation. Prim Dressing: [place], 4x4.75 (in/in) (DME) (Generic) 1 x Per Day/30 Days ary Wound #15 - Coccyx Cleanser: Soap and Water 1 x Per Day/30 Days Discharge Instructions: May shower and wash wound with dial antibacterial soap and water prior to dressing change. Cleanser: [place] Ancillary Kit - 15 Day Supply (DME) (Generic) 1 x Per Day/30 Days Discharge Instructions: Use supplies as instructed; Kit contains: (15) Saline Bullets; (15) 3x3 Gauze; 15 pr Gloves Peri-Wound Care: Skin Prep (DME) (Generic) 1 x Per Day/30 Days Discharge Instructions: Use skin prep as directed Peri-Wound Care: Zinc Oxide Ointment 30g tube 1 x Per Day/30 Days Discharge Instructions: Apply Zinc Oxide as needed to periwound for skin redness, maceration, or irritation. Prim Dressing: Hydrofera [place], 4x4.75 (in/in) (DME) (Generic) 1 x Per Day/30 Days ary Wound #16 - Labia Wound Laterality: Left Cleanser: Soap and Water 1 x Per Day/30 Days Discharge Instructions: May shower and wash wound with dial antibacterial soap and water prior to dressing change. Cleanser: [place] Ancillary Kit - 15 Day Supply (DME) (Generic) 1 x Per Day/30 Days Discharge Instructions: Use supplies as instructed; Kit contains: (15) Saline Bullets; (15) 3x3 Gauze; 15 pr Gloves Peri-Wound Care: Skin Prep (DME) (Generic) 1 x Per Day/30 Days Discharge Instructions: Use skin prep as directed Peri-Wound Care: Zinc Oxide Ointment 30g tube 1 x Per Day/30 Days Discharge Instructions: Apply Zinc Oxide as needed to periwound for skin redness, maceration,  or irritation. Prim Dressing: [place], 4x4.75 (in/in) (DME) (Generic) 1 x Per Day/30 Days ary Wound #17 - Upper Leg Wound Laterality: Left, Posterior Cleanser: Soap and Water 1 x Per Day/30 Days Discharge Instructions: May shower and wash wound with dial antibacterial soap and water prior to dressing change. Cleanser: [place] Ancillary Kit -  15 Day Supply (DME) (Generic) 1 x Per Day/30 Days Discharge Instructions: Use supplies as instructed; Kit contains: (15) Saline Bullets; (15) 3x3 Gauze; 15 pr Gloves Peri-Wound Care: Skin Prep (DME) (Generic) 1 x Per Day/30 Days Discharge Instructions: Use skin prep as directed Peri-Wound Care: Zinc Oxide Ointment 30g tube 1 x Per Day/30 Days Discharge Instructions: Apply Zinc Oxide as needed to periwound for skin redness, maceration, or irritation. Prim Dressing: Hydrofera [place], 4x4.75 (in/in) (DME) (Generic) 1 x Per Day/30 Days ary Electronic Signature(s) Signed: 10/17/2021 9:14:51 AM By: Kalman Shan DO Entered By: Kalman Shan on 10/17/2021 09:10:32 -------------------------------------------------------------------------------- Problem List Details Patient Name: Date of Service: [name], [name] M. 10/17/2021 7:30 A M Medical Record Number: [id number] Patient Account Number: [id number] Date of Birth/Sex: Treating RN: 04/10/1961 (61 y.o. [name] Primary Care Provider: [name] Other Clinician: Referring Provider: Treating Provider/Extender: Bosie Clos Weeks in Treatment: 0 Active Problems ICD-10 Encounter Code Description Active Date MDM Diagnosis [phone] Non-pressure chronic ulcer of skin of other sites with fat layer exposed 10/17/2021 No Yes S31.105A Unspecified open wound of abdominal wall, periumbilic region without 12/18/1789 No Yes penetration into peritoneal cavity, initial encounter L89.150 Pressure ulcer of sacral region, unstageable 10/17/2021 No Yes E11.622 Type 2 diabetes mellitus with other skin ulcer 10/17/2021 No Yes Z89.511 Acquired absence of right leg below knee 10/17/2021 No Yes Inactive Problems Resolved Problems Electronic Signature(s) Signed: 10/17/2021 9:14:51 AM By: Kalman Shan DO Entered By: Kalman Shan on 10/17/2021  09:02:50 -------------------------------------------------------------------------------- Progress Note Details Patient Name: Date of Service: [name], [name] M. 10/17/2021 7:30 A M Medical Record Number: [id number] Patient Account Number: [id number] Date of Birth/Sex: Treating RN: [birth year] (61 y.o. [name] Primary Care Provider: [name] Other Clinician: Referring Provider: Treating Provider/Extender: Bosie Clos Weeks in Treatment: 0 Subjective Chief Complaint Information obtained from Patient 10/17/2021; multiple scattered wounds to the abdomen, posterior left leg, left labia and sacrum History of Present Illness (HPI) 07/23/2019 on evaluation today patient presents with a myriad of wounds noted at multiple locations over her right heel, right lower extremity, and abdominal region. She also has bilateral lower extremity lymphedema which is quite significant as well. Incidentally she also has congestive heart failure and hypertension. She also is obese. With that being said I think all this is contributing as well to her lower extremity edema which is very much uncontrolled. It seems like its been at least several months since she is worn any compression according to what she tells me. With that being said I am not sure exactly when that would have been. She does have fibrotic changes in the lower extremity secondary to lymphedema worse on the left than the right. She did show me pictures of wound she had on the anterior portion of her shin which was quite significant fortunately that has healed. These issues have been intermittent at this time. Again I think that with appropriate compression therapy she may actually be doing better than what we are seeing at this point but again I am not really sure that she  is ever been extremely compliant with that. Fortunately there is no signs of active infection at this time. No fever chills noted. As far  as the abdominal ulcer she initially had one wound that she thinks may have been a bug bite. Subsequently she was put a dressing on this and she states that the tape pulled skin off on other locations causing other wounds that have not healed that is been about 3 months. The wounds on her lower extremities have been intermittent over 3 years. This includes the heel. 11/19; this is a patient that I have not seen previously. She was admitted to our clinic last week with multiple wounds including several superficial circular areas on her abdomen predominantly right upper quadrant. Also 1 in her umbilicus. She has an area on her right lateral malleolus which was new today. She has bilateral lower extremity edema with very significant stasis dermatitis on the left anterior tibial area. She has tightly adherent skin in her lower extremities probably secondary to cutaneous fibrosis in the area. We put her in compression last week. She comes in with the dressings reasonably saturated. 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 She has multiple areas on her arms from scratching. 12/3; the patient I saw for the first time 2 weeks ago. She has 3 small circular areas on her abdomen 2 in the right upper quadrant one on her umbilicus. We have been using silver alginate to this area. She also had an area on her right lateral malleolus and right heel and right medial malleolus. We have been using silver alginate here under compression. She does not have an open area on the left leg today. We are going to order her stockings for the left leg. She clearly has chronic lymphedema in these areas. X-ray of the foot from 10/20 did not show any fracture or dislocation. The heel wound was noted there was no evidence of  osteomyelitis She complains of generalized pruritus. 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 She has longstanding lymphedema. Although she does not have known liver disease I looked at his CT scan of the abdomen from February of this year that showed hepatic steatosis 12/11; patient still has no open area on the left leg we transitioned her into a stocking she had. Her stockings from [employer] are supposed to be arriving later today which will be the stockings of choice. The only wound remaining on the right is the right heel 2 small superficial open areas. Everything else is well on its way to healing in the right leg few small excoriations. She has 1 major area remaining on her abdomen the rest seem to be healing 12/22 patient still has no open area on the left leg and she is still in a stocking. She had still 2 open areas on the tip of her right heel. These look like pressure related areas although the patient is not really certain how this is happening. She has an open heeled shoe that we provided. Still has 1 open area on the right lateral abdomen The patient has complaints of generalized pruritus. She has multiple excoriated areas on her abdomen that of close over her arms or thighs which I think are from scratching. She tells me that she has never had a basic work-up for this which might  include basic lab work, liver functions, kidney functions, thyroid etc. She might also benefit from a dermatologist 12/29; the patient has a deeper larger more painful wound on the tip of her right heel. Again these look like pressure ulcers although she wears an open toed heel pads or heel at night to prevent it from hitting the mattress etc. They tell me and remind me that this is been present on and off for about 2 years that has closed over but then will reopen. I did look over her lab work that was available in [place]. She does not have elevated liver  function tests or creatinine. I was not able to see a TSH. This was in response to her complaints of generalized itching and a itch/scratch cycle. I also noted that she is on OxyContin and oxycodone and of course narcotics can cause generalized pruritus as a side effect Readmission: History From [place], [place] Last Week: 03/29/2021 this is a patient who presents for initial evaluation here in the clinic though I have seen her 1 time previously in 2020 this was in [place]. That was actually her initial visit therefore a heel ulceration. Subsequently that did not healing she actually saw me the 1 time in November and then subsequently saw Dr. Dellia Nims through the end of December where she apparently was healed. 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 This is on the right. With that being said right now she has a basically region on the lateral portion of the ray site where she is applying more pressure especially as her foot seems to be starting to turn in and this has become an increasingly significant issue for her to be honest. She does see Dr. Sharol Given and Dr. Sharol Given has had her on doxycycline for quite a bit of time here. Subsequently she is just now wrapping that out. I think that she probably would be benefited by continue the doxycycline for a time while we can work through what we need to do with things here. She does have evidence of osteomyelitis based on what I saw on the MRI today. This obviously is unfortunate and definitely not something that she was hoping to hear. With that being said I do believe that she would potentially be a strong candidate for hyperbaric oxygen therapy. Also believe she could be a candidate for total contact cast though we have to be cautious due to the fact that how her foot is bending inward I think that this is good to be a little bit of a concern. We will  definitely have to keeping a close eye on things. She does have a history of diabetes mellitus type 2. She also other than the amputation has a medical history positive for hypertension. She has never undergone hyperbaric oxygen therapy previously I did show her the chamber we did talk a little bit about it today as well. Admission [facility]: o 04/06/2021 Patient presents here in [place] today for evaluation. Again she is establishing care here after I saw her last week in [place]. Obviously I think that she is doing extremely well at this point which is great news and in general I am extremely pleased with where things stand from the standpoint of the appearance of the wound. Obviously this is not significantly smaller but does look a lot cleaner I think using the [facility]  has been of benefit over the past week. Nonetheless she still has a wound with issues with pressure here which I think is good to be an ongoing issue. Also think that the osteomyelitis which is chronic is also getting ongoing issue. She does want to try to do what she can to prevent this from worsening and ending with a below-knee amputation. T that end I do think that getting her into the hyperbaric oxygen chamber would be what we need to do. She has recently been seen by cardiology o and subsequently well as far as that is concerned. Her ejection fraction was appropriate for hyperbarics and everything seems to be doing great in that regard. She has not had a recent chest x-ray she is a former smoker around 15 years ago she quit. Nonetheless I do believe with her asthma we do want to do a chest x-ray just to make sure everything is okay before proceeding with the hyperbarics although we can go ahead and see about getting the approval. I also think she is going require some sharp debridement and around this wound we also have to contact her insurance for prior approval on this as well. 04/13/2021 upon evaluation  today patient appears to be doing a little bit better in regard to her leg in fact the swelling is dramatically better. Very pleased with where things stand in that regard. Fortunately there does not appear to be any signs of active infection at this time. No fevers, chills, nausea, vomiting, or diarrhea. 04/20/2021 upon evaluation today patient appears to be doing decently well in regard to her foot. There is some need for sharp debridement here today. With that being said we will get a go ahead and proceed with that today as the foot is becoming somewhat macerated with the overhanging callus that is definitely not we want to see. With that being said the patient does have an issue here as well with a boil in the perineal region unfortunately that is an issue for her today as well. I did have a look at that as well. We are still working on dealing with insurance as far as getting hyperbarics approved. 04/27/2021 upon evaluation today patient appears to be doing somewhat poorly in general compared to where she has been. At this point she is having a lot of swelling which is the main concerning thing that I am seeing. There does not appear to be any signs of infection currently which is good news but at the same time I do feel like that she is a lot more swollen than she was even last week and is showing how much she is weeping in regard to the right lower leg. She also has a blister on the left breast although is not an open wound at this time. She continues to have the area in the perineum as well. 05/04/2021 upon evaluation today patient appears to actually be doing decently well in regard to her wound on the foot. Fortunately there is no signs of active infection at this time. No fevers, chills, nausea, vomiting, or diarrhea. Unfortunately she does have an area on the left breast which is actually appearing to be a burn based on what I see physically. She does note that she uses rice bags for areas in  general and may have left one in that area not realizing it was burning her as she does not really have much feeling. I think this is very probable based on what I am  seeing. For that reason working to treat this as such I do think we need to loosen up a lot of the necrotic tissue here. I Am going tosend in a prescription for Santyl for the patient. 9/1; patient presents for HBO today but cannot do treatment due to wheezing and feeling short of breath when laying down. She was set up as a doctor visit today since she was not able to do HBO. She states she feels okay overall and started having a dry cough over the past couple days. She has not tested herself for COVID. She denies fever/chills or sputum production. She thinks her symptoms are related to allergies. She has been using silver alginate to her right plantar foot wound. She has not been using Santyl to the breast wound because she reports forgetting to do this. She uses zinc oxide to her perennial wound. She currently denies systemic signs of infection. 9/8; patient presents for follow-up. She has been a unable to do HBO treatments for the past week due to her back pain. She is currently taking Flexeril to address this issue. She reports no issues with her compression wrap. She has been putting Santyl on the left breast wound and Monistat cream to the perennial wound. She denies signs of infection. 9/15; patient presents for follow-up. Again she is unable to do HBO treatment today due to sinus pain. She has 2 new wounds to her right foot which she is unaware of. She has been putting Santyl on the left breast wound and zinc oxide to the perineal wound. She currently denies signs of infection. Readmission 10/17/2021 Ms. Ilaria Much is a 61 year old female with a past medical history of type 2 diabetes, osteomyelitis status post right BKA and morbid obesity that presents to the clinic for multiple scattered open wounds to her body. These are  located to the abdomen, left posterior leg and sacral region. She has been using mupirocin ointment, Neosporin and zinc oxide to the wound beds. She has been started on Bactrim and Keflex by her primary care physician and states she has several days left to complete the course. She currently denies systemic signs of infection. Patient History Information obtained from Patient. Allergies fish oil, Fish Containing Products, Crestor, lovastatin, metronidazole, Red Yeast Rice ([place] Purpureus), rotigotine, Atrovent, Suprep Bowel Prep Kit, adhesive tape, Betadine, peanut (Severity: Severe), Dial soap (Reaction: itching) Family History Diabetes - Mother, Heart Disease - Mother,Father,Siblings, Hypertension - Mother,Father,Siblings, Lung Disease - Father, Stroke - Mother, No family history of Cancer, Hereditary Spherocytosis, Kidney Disease, Seizures, Thyroid Problems, Tuberculosis. Social History Former smoker - quit 15 years ago, Marital Status - Married, Alcohol Use - Never, Drug Use - No History, Caffeine Use - Rarely. Medical History Eyes Patient has history of Cataracts - both eyes Denies history of Glaucoma, Optic Neuritis Ear/Nose/Mouth/Throat Denies history of Chronic sinus problems/congestion, Middle ear problems Hematologic/Lymphatic Patient has history of Lymphedema Denies history of Anemia, Hemophilia, Human Immunodeficiency Virus, Sickle Cell Disease Respiratory Patient has history of Asthma Denies history of Aspiration, Chronic Obstructive Pulmonary Disease (COPD), Pneumothorax, Sleep Apnea, Tuberculosis Cardiovascular Patient has history of Congestive Heart Failure, Coronary Artery Disease, Hypertension, Peripheral Arterial Disease, Peripheral Venous Disease Denies history of Angina, Arrhythmia, Hypotension, Myocardial Infarction, Phlebitis, Vasculitis Gastrointestinal Denies history of Hepatitis A, Hepatitis B, Hepatitis C Endocrine Patient has history of Type II  Diabetes Denies history of Type I Diabetes Genitourinary Denies history of End Stage Renal Disease Immunological Denies history of Lupus Erythematosus, Raynaudoos, Scleroderma Integumentary (Skin) Denies  history of History of Burn Musculoskeletal Patient has history of Rheumatoid Arthritis Denies history of Gout, Osteoarthritis, Osteomyelitis Neurologic Patient has history of Neuropathy Denies history of Dementia, Quadriplegia, Paraplegia, Seizure Disorder Oncologic Denies history of Received Chemotherapy, Received Radiation Psychiatric Denies history of Anorexia/bulimia, Confinement Anxiety Patient is treated with Insulin, Oral Agents. Blood sugar is tested. Hospitalization/Surgery History - 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 Sharol Given 01/2020. - 05/2021 R BKA. - 06/29/2021 revision R BKA. - 11/22 R AKA. Medical A Surgical History Notes nd Constitutional Symptoms (General Health) hypothyroidism Cardiovascular cardiac stents Gastrointestinal GERD Endocrine Hypothyroidism Immunological Mix connective tissue disease- autoimmune Sjogren's syndrome Musculoskeletal Fibromyalgia, Scoliosis, Review of Systems (ROS) Constitutional Symptoms (General Health) Denies complaints or symptoms of Fatigue, Fever, Chills, Marked Weight Change. Eyes Denies complaints or symptoms of Dry Eyes, Vision Changes, Glasses / Contacts. Ear/Nose/Mouth/Throat Denies complaints or symptoms of Chronic sinus problems or rhinitis. Cardiovascular Denies complaints or symptoms of Chest pain. Gastrointestinal Denies complaints or symptoms of Frequent diarrhea, Nausea, Vomiting. Genitourinary Denies complaints or symptoms of Frequent urination. Integumentary (Skin) Complains or has symptoms of Wounds - abdomen, buttock. Musculoskeletal Denies complaints or symptoms of Muscle Pain, Muscle Weakness. Neurologic Denies complaints or symptoms of Numbness/parasthesias. Psychiatric Denies complaints or  symptoms of Claustrophobia, Suicidal. Objective Constitutional respirations regular, non-labored and within target range for patient.. Vitals Time Taken: 7:48 AM, Height: 62 in, Source: Stated. Psychiatric pleasant and cooperative. General Notes: Abdomen: Open wound to previous surgical site. Appears excoriated. Left posterior leg scattered open wounds with increased erythema to the periwound. Coccyx: Open wound with nonviable tissue throughout Left labia: Open wound with granulation tissue present. Integumentary (Hair, Skin) Wound #13 status is Open. Original cause of wound was Gradually Appeared. The date acquired was: 09/06/2021. The wound is located on the Proximal,Midline Abdomen - Lower Quadrant. The wound measures 5.4cm length x 2.5cm width x 0.2cm depth; 10.603cm^2 area and 2.121cm^3 volume. There is Fat Layer (Subcutaneous Tissue) exposed. There is no tunneling or undermining noted. There is a medium amount of serosanguineous drainage noted. The wound margin is distinct with the outline attached to the wound base. There is large (67-100%) red, pink granulation within the wound bed. There is a small (1-33%) amount of necrotic tissue within the wound bed including Adherent Slough. Wound #14 status is Open. Original cause of wound was Gradually Appeared. The date acquired was: 09/06/2021. The wound is located on the [place]. The wound measures 4cm length x 0.5cm width x 0.1cm depth; 1.571cm^2 area and 0.157cm^3 volume. There is Fat Layer (Subcutaneous Tissue) exposed. There is no tunneling or undermining noted. There is a medium amount of serosanguineous drainage noted. There is large (67- 100%) red granulation within the wound bed. There is a small (1-33%) amount of necrotic tissue within the wound bed including Adherent Slough. Wound #15 status is Open. Original cause of wound was Pressure Injury. The date acquired was: 09/14/2021. The wound is located on the  Coccyx. The wound measures 1.4cm length x 0.9cm width x 0.1cm depth; 0.99cm^2 area and 0.099cm^3 volume. There is Fat Layer (Subcutaneous Tissue) exposed. There is no tunneling or undermining noted. There is a medium amount of serosanguineous drainage noted. There is no granulation within the wound bed. There is a large (67-100%) amount of necrotic tissue within the wound bed including Adherent Slough. Wound #16 status is Open. Original cause of wound was Gradually Appeared. The date acquired was: 05/12/2021. The wound is located on the Left Labia. The  wound measures 2.9cm length x 1.6cm width x 0.1cm depth; 3.644cm^2 area and 0.364cm^3 volume. There is Fat Layer (Subcutaneous Tissue) exposed. There is no tunneling or undermining noted. There is a medium amount of serosanguineous drainage noted. The wound margin is distinct with the outline attached to the wound base. There is large (67-100%) pink, pale granulation within the wound bed. There is a small (1-33%) amount of necrotic tissue within the wound bed including Adherent Slough. Wound #17 status is Open. Original cause of wound was Gradually Appeared. The date acquired was: 09/05/2021. The wound is located on the Left,Posterior Upper Leg. The wound measures 8.8cm length x 7.5cm width x 0.1cm depth; 51.836cm^2 area and 5.184cm^3 volume. There is Fat Layer (Subcutaneous Tissue) exposed. There is no tunneling or undermining noted. There is a medium amount of serosanguineous drainage noted. The wound margin is distinct with the outline attached to the wound base. There is large (67-100%) pink, pale granulation within the wound bed. There is a small (1-33%) amount of necrotic tissue within the wound bed including Adherent Slough. 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 These are related to either pressure injuries or increased moisture due to location and folds. Her abdominal wound is from her scratching a previous surgical site. These have all developed over the past couple months. At this time I recommended Hydrofera Blue to all wound sites. She is already on Bactrim and Keflex and I recommended she continue this. We discussed the importance of offloading. Follow-up in 1 week. Procedures Wound #13 Pre-procedure diagnosis of Wound #13 is a Dehisced Wound located on the Proximal,Midline Abdomen - Lower Quadrant . There was a Chemical/Enzymatic/Mechanical debridement performed by Deon Pilling, RN.. Other agent used was gauze and wound cleanser. There was no bleeding. The procedure was tolerated well. Post Debridement Measurements: 5.4cm length x 2.5cm width x 0.2cm depth; 2.121cm^3 volume. Character of Wound/Ulcer Post Debridement is stable. Post procedure Diagnosis Wound #13: Same as Pre-Procedure Wound #14 Pre-procedure diagnosis of Wound #14 is a Dehisced Wound located on the Distal,Medial Abdomen - Lower Quadrant . There was a Chemical/Enzymatic/Mechanical debridement performed by Deon Pilling, RN.. Other agent used was gauze and wound cleanser. There was no bleeding. The procedure was tolerated well. Post Debridement Measurements: 4cm length x 0.5cm width x 0.1cm depth; 0.157cm^3 volume. Character of Wound/Ulcer Post Debridement is stable. Post procedure Diagnosis Wound #14: Same as Pre-Procedure Wound #15 Pre-procedure diagnosis of Wound #15 is a Pressure Ulcer located on the Coccyx . There was a Chemical/Enzymatic/Mechanical debridement performed by Deon Pilling, RN.. Other agent used was gauze and  wound cleanser. There was no bleeding. The procedure was tolerated well. Post Debridement Measurements: 1.4cm length x 0.9cm width x 0.1cm depth; 0.099cm^3 volume. Post debridement Stage noted as Unstageable/Unclassified. Character of Wound/Ulcer Post Debridement is stable. Post procedure Diagnosis Wound #15: Same as Pre-Procedure Wound #16 Pre-procedure diagnosis of Wound #16 is an Abrasion located on the Left Labia . There was a Chemical/Enzymatic/Mechanical debridement performed by Deon Pilling, RN.. Other agent used was gauze and wound cleanser. There was no bleeding. The procedure was tolerated well. Post Debridement Measurements: 2.9cm length x 1.6cm  width x 0.1cm depth; 0.364cm^3 volume. Character of Wound/Ulcer Post Debridement is stable. Post procedure Diagnosis Wound #16: Same as Pre-Procedure Wound #17 Pre-procedure diagnosis of Wound #17 is an Abrasion located on the Left,Posterior Upper Leg .Severity of Tissue Pre Debridement is: Fat layer exposed. There was a Chemical/Enzymatic/Mechanical debridement performed by Deon Pilling, RN.. Other agent used was gauze and wound cleanser. There was no bleeding. The procedure was tolerated well. Post Debridement Measurements: 8.5cm length x 7.5cm width x 0.1cm depth; 5.007cm^3 volume. Character of Wound/Ulcer Post Debridement is stable. Severity of Tissue Post Debridement is: Fat layer exposed. Post procedure Diagnosis Wound #17: Same as Pre-Procedure Plan Follow-up Appointments: Return Appointment in 1 week. - with Dr. Heber Storden Bathing/ Shower/ Hygiene: May shower and wash wound with soap and water. Edema Control - Lymphedema / SCD / Other: Elevate legs to the level of the heart or above for 30 minutes daily and/or when sitting, a frequency of: - 3-4 times a day throughout the day Patient to wear own compression stockings every day. - left leg daily Exercise regularly Moisturize legs daily. - left leg every night before bed. Non Wound  Condition: Apply the following to affected area as directed: - continue the antifungal powder under skin folds for rash like areas. WOUND #13: - Abdomen - Lower Quadrant Wound Laterality: Midline, Proximal Cleanser: Soap and Water 1 x Per Day/30 Days Discharge Instructions: May shower and wash wound with dial antibacterial soap and water prior to dressing change. Cleanser: [place] Ancillary Kit - 15 Day Supply (DME) (Generic) 1 x Per Day/30 Days Discharge Instructions: Use supplies as instructed; Kit contains: (15) Saline Bullets; (15) 3x3 Gauze; 15 pr Gloves Peri-Wound Care: Skin Prep (DME) (Generic) 1 x Per Day/30 Days Discharge Instructions: Use skin prep as directed Peri-Wound Care: Zinc Oxide Ointment 30g tube 1 x Per Day/30 Days Discharge Instructions: Apply Zinc Oxide as needed to periwound for skin redness, maceration, or irritation. Prim Dressing: Hydrofera [place], 4x4.75 (in/in) (DME) (Generic) 1 x Per Day/30 Days ary WOUND #14: - Abdomen - Lower Quadrant Wound Laterality: Medial, Distal Cleanser: Soap and Water 1 x Per Day/30 Days Discharge Instructions: May shower and wash wound with dial antibacterial soap and water prior to dressing change. Cleanser: [place] Ancillary Kit - 15 Day Supply (DME) (Generic) 1 x Per Day/30 Days Discharge Instructions: Use supplies as instructed; Kit contains: (15) Saline Bullets; (15) 3x3 Gauze; 15 pr Gloves Peri-Wound Care: Skin Prep (DME) (Generic) 1 x Per Day/30 Days Discharge Instructions: Use skin prep as directed Peri-Wound Care: Zinc Oxide Ointment 30g tube 1 x Per Day/30 Days Discharge Instructions: Apply Zinc Oxide as needed to periwound for skin redness, maceration, or irritation. Prim Dressing: Hydrofera [place], 4x4.75 (in/in) (DME) (Generic) 1 x Per Day/30 Days ary WOUND #15: - Coccyx Wound Laterality: Cleanser: Soap and Water 1 x Per Day/30 Days Discharge Instructions: May  shower and wash wound with dial antibacterial soap and water prior to dressing change. Cleanser: [place] Ancillary Kit - 15 Day Supply (DME) (Generic) 1 x Per Day/30 Days Discharge Instructions: Use supplies as instructed; Kit contains: (15) Saline Bullets; (15) 3x3 Gauze; 15 pr Gloves Peri-Wound Care: Skin Prep (DME) (Generic) 1 x Per Day/30 Days Discharge Instructions: Use skin prep as directed Peri-Wound Care: Zinc Oxide Ointment 30g tube 1 x Per Day/30 Days Discharge Instructions: Apply Zinc Oxide as needed to periwound for skin redness, maceration, or irritation. Prim Dressing: [place]  Dressing With [place], 4x4.75 (in/in) (DME) (Generic) 1 x Per Day/30 Days ary WOUND #16: - Labia Wound Laterality: Left Cleanser: Soap and Water 1 x Per Day/30 Days Discharge Instructions: May shower and wash wound with dial antibacterial soap and water prior to dressing change. Cleanser: [place] Ancillary Kit - 15 Day Supply (DME) (Generic) 1 x Per Day/30 Days Discharge Instructions: Use supplies as instructed; Kit contains: (15) Saline Bullets; (15) 3x3 Gauze; 15 pr Gloves Peri-Wound Care: Skin Prep (DME) (Generic) 1 x Per Day/30 Days Discharge Instructions: Use skin prep as directed Peri-Wound Care: Zinc Oxide Ointment 30g tube 1 x Per Day/30 Days Discharge Instructions: Apply Zinc Oxide as needed to periwound for skin redness, maceration, or irritation. Prim Dressing: [place], 4x4.75 (in/in) (DME) (Generic) 1 x Per Day/30 Days ary WOUND #17: - Upper Leg Wound Laterality: Left, Posterior Cleanser: Soap and Water 1 x Per Day/30 Days Discharge Instructions: May shower and wash wound with dial antibacterial soap and water prior to dressing change. Cleanser: [place] Ancillary Kit - 15 Day Supply (DME) (Generic) 1 x Per Day/30 Days Discharge Instructions: Use supplies as instructed; Kit contains: (15) Saline Bullets; (15) 3x3 Gauze; 15 pr Gloves Peri-Wound  Care: Skin Prep (DME) (Generic) 1 x Per Day/30 Days Discharge Instructions: Use skin prep as directed Peri-Wound Care: Zinc Oxide Ointment 30g tube 1 x Per Day/30 Days Discharge Instructions: Apply Zinc Oxide as needed to periwound for skin redness, maceration, or irritation. Prim Dressing: [place], 4x4.75 (in/in) (DME) (Generic) 1 x Per Day/30 Days ary 1. Hydrofera Blue 2. Offloading 3. Follow-up in 1 week Electronic Signature(s) Signed: 10/17/2021 9:14:51 AM By: Kalman Shan DO Entered By: Kalman Shan on 10/17/2021 09:13:07 -------------------------------------------------------------------------------- HxROS Details Patient Name: Date of Service: [name], [name] M. 10/17/2021 7:30 A M Medical Record Number: [id number] Patient Account Number: [id number] Date of Birth/Sex: Treating RN: Mar 02, 1961 (61 y.o. [name] Primary Care Provider: [name] Other Clinician: Referring Provider: Treating Provider/Extender: Egbert Garibaldi, EUNICE Weeks in Treatment: 0 Information Obtained From Patient Constitutional Symptoms (General Health) Complaints and Symptoms: Negative for: Fatigue; Fever; Chills; Marked Weight Change Medical History: Past Medical History Notes: hypothyroidism Eyes Complaints and Symptoms: Negative for: Dry Eyes; Vision Changes; Glasses / Contacts Medical History: Positive for: Cataracts - both eyes Negative for: Glaucoma; Optic Neuritis Ear/Nose/Mouth/Throat Complaints and Symptoms: Negative for: Chronic sinus problems or rhinitis Medical History: Negative for: Chronic sinus problems/congestion; Middle ear problems Cardiovascular Complaints and Symptoms: Negative for: Chest pain Medical History: Positive for: Congestive Heart Failure; Coronary Artery Disease; Hypertension; Peripheral Arterial Disease; Peripheral Venous Disease Negative for: Angina; Arrhythmia; Hypotension; Myocardial  Infarction; Phlebitis; Vasculitis Past Medical History Notes: cardiac stents Gastrointestinal Complaints and Symptoms: Negative for: Frequent diarrhea; Nausea; Vomiting Medical History: Negative for: Hepatitis A; Hepatitis B; Hepatitis C Past Medical History Notes: GERD Genitourinary Complaints and Symptoms: Negative for: Frequent urination Medical History: Negative for: End Stage Renal Disease Integumentary (Skin) Complaints and Symptoms: Positive for: Wounds - abdomen, buttock Medical History: Negative for: History of Burn Musculoskeletal Complaints and Symptoms: Negative for: Muscle Pain; Muscle Weakness Medical History: Positive for: Rheumatoid Arthritis Negative for: Gout; Osteoarthritis; Osteomyelitis Past Medical History Notes: Fibromyalgia, Scoliosis, Neurologic Complaints and Symptoms: Negative for: Numbness/parasthesias Medical History: Positive for: Neuropathy Negative for: Dementia; Quadriplegia; Paraplegia; Seizure Disorder Psychiatric Complaints and Symptoms: Negative for: Claustrophobia; Suicidal Medical History: Negative for: Anorexia/bulimia; Confinement Anxiety Hematologic/Lymphatic Medical History: Positive for: Lymphedema Negative for: Anemia;  Hemophilia; Human Immunodeficiency Virus; Sickle Cell Disease Respiratory Medical History: Positive for: Asthma Negative for: Aspiration; Chronic Obstructive Pulmonary Disease (COPD); Pneumothorax; Sleep Apnea; Tuberculosis Endocrine Medical History: Positive for: Type II Diabetes Negative for: Type I Diabetes Past Medical History Notes: Hypothyroidism Time with diabetes: since 2002 Treated with: Insulin, Oral agents Blood sugar tested every day: Yes Tested : 2-3 times a day Immunological Medical History: Negative for: Lupus Erythematosus; Raynauds; Scleroderma 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 Family and Social History Cancer: No; Diabetes: Yes - Mother; Heart Disease: Yes - Mother,Father,Siblings; Hereditary Spherocytosis: No; Hypertension: Yes - Mother,Father,Siblings; Kidney Disease: No; Lung Disease: Yes - Father; Seizures: No; Stroke: Yes - Mother; Thyroid Problems: No; Tuberculosis: No; Former smoker - quit 15 years ago; Marital Status - Married; Alcohol Use: Never; Drug Use: No History; Caffeine Use: Rarely; Financial Concerns: No; Food, Clothing or Shelter Needs: No; Support System Lacking: No; Transportation Concerns: No Electronic Signature(s) Signed: 10/17/2021 9:14:51 AM By: Kalman Shan DO Signed: 10/17/2021 4:47:34 PM By: Deon Pilling RN, BSN Entered By: Deon Pilling on 10/17/2021 08:00:12 -------------------------------------------------------------------------------- SuperBill Details Patient Name: Date of Service: Bolt, [name] M. 10/17/2021 Medical Record Number: [id number] Patient Account Number: [id number] Date of Birth/Sex: Treating RN: [birth year] (60 y.o. Helene Shoe, Meta.Reding Primary Care Provider: [name] Other Clinician: Referring Provider: Treating Provider/Extender: Bosie Clos Weeks in Treatment: 0 Diagnosis Coding ICD-10 Codes Code Description [phone] Non-pressure chronic ulcer of skin of other sites with fat layer exposed S31.105A Unspecified open wound of abdominal wall, periumbilic region without penetration into peritoneal cavity, initial encounter L89.150 Pressure ulcer of sacral region,  unstageable E11.622 Type 2 diabetes mellitus with other skin ulcer Z89.511 Acquired absence of right leg below knee Facility Procedures CPT4 Code: [id number] Description: [place] VISIT-LEV 3 EST PT Modifier: Quantity: 1 CPT4 Code: [id number] Description: 94503 - [place] W/O ANES NON SELECT Modifier: Quantity: 1 Physician Procedures : CPT4 Code Description Modifier [id number] - WC PHYS LEVEL 3 - EST PT ICD-10 Diagnosis Description L98.492 Non-pressure chronic ulcer of skin of other sites with fat layer exposed S31.105A Unspecified open wound of abdominal wall, periumbilic region  without penetration into peritoneal cavity, in encounter L89.150 Pressure ulcer of sacral region, unstageable E11.622 Type 2 diabetes mellitus with other skin ulcer Quantity: 1 itial Electronic Signature(s) Signed: 10/17/2021 9:14:51 AM By: Kalman Shan DO Entered By: Kalman Shan on 10/17/2021 09:14:24

## 2021-10-17 NOTE — Patient Instructions (Signed)
Mrs. Lookabaugh It was a pleasure speaking with you today.  I have attached a summary of our visit today and information about your health goals.   If you have any questions or concerns, please feel free to contact me either at the phone number below or with a MyChart message.   Keep up the good work!  Henrene Pastor, PharmD Clinical Pharmacist [facility] [phone] (direct line)  [phone] (main office number)   Updated Care Plan (10/17/2021)   Diabetes: Uncontrolled; A1c improving; A1c goal < 7.0% Lab Results  Component Value Date   HGBA1C 7.3 (H) 05/27/2021    Current treatment: Humulin R U-500 - inject 3 to 40 units 3 times a day Metformine Er 500mg  - take 2 tablets twice a day Interventions:  Complete and return application for patient assistance program for [employer] for Humulin insulin to [employer], clinical pharmacist at [facility] Check blood glucose 3 to 4 times a day. Record for review at future appointments.  Reviewed home blood glucose readings and reviewed goals  Fasting blood glucose goal (before meals) = 80 to 130 Blood glucose goal after a meal = less than 180   Hyperlipidemia / Heart Disease: Uncontrolled; LDL goal <70 Lab Results  Component Value Date   CHOL 194 10/27/2020   CHOL 176 05/04/2020   CHOL 217 (H) 04/08/2018   Lab Results  Component Value Date   HDL 46.60 10/27/2020   HDL 41 05/04/2020   HDL 48 04/08/2018   Lab Results  Component Value Date   TRIG 223.0 (H) 10/27/2020   TRIG 204 (H) 05/04/2020   TRIG 116 04/08/2018   Lab Results  Component Value Date   LDLDIRECT 121.0 10/27/2020    Current treatment: Ezetimibe 10mg  daily (last filled 06/24/2021 - patient states she is not taking - forgot)  Aspirin 81mg  daily  Isosorbide ER 30mg  daily  Interventions:  Sent in updated prescription for ezetimibe 10mg  daily Take ezetimibe 10mg  daily  Recommend recheck Lipids in 6 to 8 weeks.   Discussed LDL goal  Hypertension:  Controlled; Last 2 of 3 office blood pressure readings have been at goal BP Readings from Last 3 Encounters:  10/10/21 118/72  09/27/21 133/73  07/29/21 (!) 163/89   Blood pressure goal <140/90 Current therapy:  Valsartan 160mg  daily  Diltiazem 240mg  daily  Interventions:  Discussed blood pressure goals Continue current therapy for hypertension   Asthma: Uncontrolled; Goal: decrease number of days per month rescue inhaler is needed.  Current treatment: Albuterol nebulization or inhaler - use as needed up to every 4 hours as needed for wheezing and shortness of breath Fluticasone / salmerterol 250/28mcg - inhaler 1 puff into lungs twice a day (not taking due to cost) Interventions:  Discussed benefits of maintenance inhalers in controlling asthma and preventing exacerbations.  Mailed application for Symbicort patient assistance program. Patient is willing to retry Symbicort if approved for patient assistance program. Complete patient assistance program application and return to Henrene Pastor, clinical pharmacist at [facility]  Medication management Pharmacist Clinical Goal(s): Over the next 90 days, patient will work with PharmD and providers to maintain optimal medication adherence Current pharmacy: [facility] Interventions Comprehensive medication review performed. Reviewed refill history and assessed adherence Continue current medication management strategy Prescription for 5 more days of generic Septra was sent in   Patient Goals/[name] Activities Over the next 90 days, patient will:  take medications as prescribed,  check glucose 3 to 4  times per day, document, and provide at future appointments,  collaborate with provider on medication access solutions, and complete patient assistance applications and return to [facility] (Novah Nessel, clinical pharmacist) Continue ezetimibe 10mg  daily   Follow Up Plan:  Telephone follow up appointment with care management team member scheduled for:  1 month  Patient verbalizes understanding of instructions and care plan provided today and agrees to view in MyChart. Active MyChart status confirmed with patient.

## 2021-10-17 NOTE — Progress Notes (Signed)
Hirst, RUE VALLADARES ([id number]) Visit Report for 10/17/2021 Abuse Risk Screen Details Patient Name: Date of Service: [place], [name] 10/17/2021 7:30 A M Medical Record Number: [id number] Patient Account Number: [id number] Date of Birth/Sex: Treating RN: [birth year] (60 y.o. Arta Silence Primary Care Rober Skeels: [name] Other Clinician: Referring Bruk Tumolo: Treating Kensey Luepke/Extender: Laurey Morale, EUNICE Weeks in Treatment: 0 Abuse Risk Screen Items Answer ABUSE RISK SCREEN: Has anyone close to you tried to hurt or harm you recentlyo No Do you feel uncomfortable with anyone in your familyo No Has anyone forced you do things that you didnt want to doo No Electronic Signature(s) Signed: 10/17/2021 4:47:34 PM By: Shawn Stall RN, BSN Entered By: Shawn Stall on 10/17/2021 08:00:18 -------------------------------------------------------------------------------- Activities of Daily Living Details Patient Name: Date of Service: Vidrio, [name] 10/17/2021 7:30 A M Medical Record Number: [id number] Patient Account Number: [id number] Date of Birth/Sex: Treating RN: 1961-08-22 (60 y.o. [name], Yvonne Kendall Primary Care Doniesha Landau: [name] Other Clinician: Referring Nandi Tonnesen: Treating Brenner Visconti/Extender: Laurey Morale, EUNICE Weeks in Treatment: 0 Activities of Daily Living Items Answer Activities of Daily Living (Please select one for each item) Drive Automobile Not Able T Medications ake Completely Able Use T elephone Completely Able Care for Appearance Completely Able Use T oilet Completely Able Bath / Shower Need Assistance Dress Self Need Assistance Feed Self Completely Able Walk Not Able Get In / Out Bed Need Assistance Housework Need Assistance Prepare Meals Need Assistance Handle Money Need Assistance Shop for Self Need Assistance Electronic Signature(s) Signed: 10/17/2021 4:47:34 PM By: Shawn Stall RN, BSN Entered By: Shawn Stall  on 10/17/2021 08:00:44 -------------------------------------------------------------------------------- Education Screening Details Patient Name: Date of Service: [place], [name] 10/17/2021 7:30 A M Medical Record Number: [id number] Patient Account Number: [id number] Date of Birth/Sex: Treating RN: 1961-08-01 (60 y.o. [name], Yvonne Kendall Primary Care Tiffinie Caillier: [name] Other Clinician: Referring Chistopher Mangino: Treating Artrell Lawless/Extender: Turner Daniels Weeks in Treatment: 0 Primary Learner Assessed: Patient Learning Preferences/Education Level/Primary Language Learning Preference: Explanation, Demonstration, Printed Material Highest Education Level: College or Above Preferred Language: English Cognitive Barrier Language Barrier: No Translator Needed: No Memory Deficit: No Emotional Barrier: No Cultural/Religious Beliefs Affecting Medical Care: No Physical Barrier Impaired Vision: Yes Glasses Impaired Hearing: No Decreased Hand dexterity: No Knowledge/Comprehension Knowledge Level: High Comprehension Level: High Ability to understand written instructions: High Ability to understand verbal instructions: High Motivation Anxiety Level: Calm Cooperation: Cooperative Education Importance: Acknowledges Need Interest in Health Problems: Asks Questions Perception: Coherent Willingness to Engage in Self-Management High Activities: Readiness to Engage in Self-Management High Activities: Electronic Signature(s) Signed: 10/17/2021 4:47:34 PM By: Shawn Stall RN, BSN Entered By: Shawn Stall on 10/17/2021 08:01:17 -------------------------------------------------------------------------------- Fall Risk Assessment Details Patient Name: Date of Service: [place], [name] 10/17/2021 7:30 A M Medical Record Number: [id number] Patient Account Number: [id number] Date of Birth/Sex: Treating RN: [birth year] (60 y.o. [name], [name] Primary Care Trayshawn Durkin:  [name] Other Clinician: Referring Decarlos Empey: Treating Nick Armel/Extender: Laurey Morale, EUNICE Weeks in Treatment: 0 Fall Risk Assessment Items Have you had 2 or more falls in the last 12 monthso 0 Yes Have you had any fall that resulted in injury in the last 12 monthso 0 Yes FALLS RISK SCREEN History of falling - immediate or within 3 months 25 Yes Secondary diagnosis (Do you have 2 or more medical diagnoseso) 0 No Ambulatory aid None/bed rest/wheelchair/nurse 0 Yes Crutches/cane/walker 15 Yes Furniture 0 No Intravenous therapy Access/Saline/Heparin  Lock 0 No Gait/Transferring Normal/ bed rest/ wheelchair 0 No Weak (short steps with or without shuffle, stooped but able to lift head while walking, may seek 0 No support from furniture) Impaired (short steps with shuffle, may have difficulty arising from chair, head down, impaired 20 Yes balance) Mental Status Oriented to own ability 0 Yes Electronic Signature(s) Signed: 10/17/2021 4:47:34 PM By: Shawn Stalleaton, Bobbi RN, BSN Entered By: Shawn Stalleaton, Bobbi on 10/17/2021 08:01:29 -------------------------------------------------------------------------------- Foot Assessment Details Patient Name: Date of Service: [place]rm[place], [name] 10/17/2021 7:30 A M Medical Record Number: [id number] Patient Account Number: [id number] Date of Birth/Sex: Treating RN: 04/29/1961 (60 y.o. Arta SilenceF) Deaton, Bobbi Primary Care Noami Bove: Arva ChafeWendling, Nicholas Other Clinician: Referring Lonni Dirden: Treating Cricket Goodlin/Extender: Laurey MoraleHoffman, Jessica THO MA S, EUNICE Weeks in Treatment: 0 Foot Assessment Items Site Locations + = Sensation present, - = Sensation absent, C = Callus, U = Ulcer R = Redness, W = Warmth, M = Maceration, PU = Pre-ulcerative lesion F = Fissure, S = Swelling, D = Dryness Assessment Right: Left: Other Deformity: No No Prior Foot Ulcer: No No Prior Amputation: No No Charcot Joint: No No Ambulatory Status: Non-ambulatory Assistance  Device: Wheelchair Gait: Unsteady Notes No BLE wounds. uses a walker to stand and pivot. Electronic Signature(s) Signed: 10/17/2021 4:47:34 PM By: Shawn Stalleaton, Bobbi RN, BSN Entered By: Shawn Stalleaton, Bobbi on 10/17/2021 08:02:23 -------------------------------------------------------------------------------- Nutrition Risk Screening Details Patient Name: Date of Service: Madan, [name] 10/17/2021 7:30 A M Medical Record Number: [id number] Patient Account Number: [id number] Date of Birth/Sex: Treating RN: 04/29/1961 (60 y.o. Arta SilenceF) Deaton, Bobbi Primary Care Santina Trillo: Arva ChafeWendling, Nicholas Other Clinician: Referring Vallarie Fei: Treating Imre Vecchione/Extender: Laurey MoraleHoffman, Jessica THO MA S, EUNICE Weeks in Treatment: 0 Height (in): 62 Weight (lbs): Body Mass Index (BMI): Nutrition Risk Screening Items Score Screening NUTRITION RISK SCREEN: I have an illness or condition that made me change the kind and/or amount of food I eat 2 Yes I eat fewer than two meals per day 0 No I eat few fruits and vegetables, or milk products 0 No I have three or more drinks of beer, liquor or wine almost every day 0 No I have tooth or mouth problems that make it hard for me to eat 0 No I don't always have enough money to buy the food I need 0 No I eat alone most of the time 0 No I take three or more different prescribed or over-the-counter drugs a day 1 Yes Without wanting to, I have lost or gained 10 pounds in the last six months 0 No I am not always physically able to shop, cook and/or feed myself 2 Yes Nutrition Protocols Good Risk Protocol Provide education on elevated blood Moderate Risk Protocol 0 sugars and impact on wound healing, as applicable High Risk Proctocol Risk Level: Moderate Risk Score: 5 Electronic Signature(s) Signed: 10/17/2021 4:47:34 PM By: Shawn Stalleaton, Bobbi RN, BSN Entered By: Shawn Stalleaton, Bobbi on 10/17/2021 08:01:56

## 2021-10-17 NOTE — Chronic Care Management (AMB) (Signed)
Chronic Care Management Pharmacy Note  10/17/2021 Name:  [name] MRN:  [id number] DOB:  01/13/1961  Summary: Patient has a high deductible [employer] medical plan - [employer]. Cost of insulin and maintenance inhalers are expensive. Provided patient patient assistance program application for Humulin and Symbicort (not currently taking Symbicort but patient is willing to retry if approved for patient assistance program) at last visit but she has not completed. Reminded to complete.  Patient lost 3 tablets of generic Septra - asking for additional refill. Dr Nani Ravens approved. Rx sent in for Septra twice daily for 5 more days  Medication adherence improving.   Subjective: [name] is an 61 y.o. year old female who is a primary patient of Shelda Pal, DO.  The CCM team was consulted for assistance with disease management and care coordination needs.    Engaged with patient by telephone for follow up visit in response to provider referral for pharmacy case management and/or care coordination services.   Consent to Services:  The patient was given information about Chronic Care Management services, agreed to services, and gave verbal consent prior to initiation of services.  Please see initial visit note for detailed documentation.   Patient Care Team: Shelda Pal, DO as PCP - General (Family Medicine) Sueanne Margarita, MD as PCP - Cardiology (Cardiology) Luretha Rued, RN as Case Manager Cherre Robins, RPH-CPP (Pharmacist) [place], Maree Erie, LCSW as Social Worker (Licensed Clinical Social Worker)  Recent office visits: 10/10/2021 - PCP (Dr Nani Ravens) Seen for cellulitis. Prescribed Sulfamethoxazole-Trimethoprim 800-160 MG 1 tablet Oral 2 times daily for 10 days. Patient has already started cephalexin 500mg  3 times a day at home. 07/05/2021 - [place] - started omeperazole; Changed pramipexole to 1mg  3 times a day and 0.5mg  twice a day if needed for  breakthrough restless leg syndrome. stopped esomeprazole 40mg    Recent consult visits: 10/12/2021 - Ortho Surgery Coralyn Pear, NP) F/U AKA of right leg. Changed diphenhydramine to hydroxyzine 10mg  3 times a day as needed. F/U 2 weeks.  08/25/2021 - Ortho Surgery (Dr Sharol Given) F/U Right AKA 07/22/2021. Continue with protein supplements. F/U in 4 weeks.   08/18/2021 - Pain Mangement Marcello Moores, NP) Follow above the knee amputation. decreased dose of oxycodone to 20mg  every 8 hours as needed.  08/03/2021 - Pain Management - phone call. Lidocaine 5% patches denied by insurance.  07/19/2021 [facility] - added doxycycline 100mg  bid for 30 days 07/15/2021 - Pain Management - increased oxycodone 20mg  every 8 hours as needed; increased pregabalin 25mg  twice a day  Hospital visits: 07/22/2021 Hospitalization Stop tramadol; Changed Humulin U500 inject 40 to 150 units 3 times a day with meals.  Objective:  Lab Results  Component Value Date   CREATININE 0.88 07/27/2021   CREATININE 1.28 (H) 07/24/2021   CREATININE 0.81 07/23/2021    Lab Results  Component Value Date   HGBA1C 7.3 (H) 05/27/2021   Last diabetic Eye exam: No results found for: HMDIABEYEEXA  Last diabetic Foot exam: No results found for: HMDIABFOOTEX      Component Value Date/Time   CHOL 194 10/27/2020 0809   CHOL 176 05/04/2020 0947   TRIG 223.0 (H) 10/27/2020 0809   HDL 46.60 10/27/2020 0809   HDL 41 05/04/2020 0947   CHOLHDL 4 10/27/2020 0809   VLDL 44.6 (H) 10/27/2020 0809   LDLCALC 100 (H) 05/04/2020 0947   LDLDIRECT 121.0 10/27/2020 0809    Hepatic Function Latest Ref Rng & Units 06/09/2021 05/28/2021  05/26/2021  Total Protein 6.5 - 8.1 g/dL 6.4(L) 6.1(L) 7.8  Albumin 3.5 - 5.0 g/dL 2.5(L) 2.9(L) 3.9  AST 15 - 41 U/L 51(H) 76(H) 42(H)  ALT 0 - 44 U/L 39 36 33  Alk Phosphatase 38 - 126 U/L 63 37(L) 48  Total Bilirubin 0.3 - 1.2 mg/dL 0.6 1.1 0.7  Bilirubin, Direct 0.0 - 0.2 mg/dL - 0.1 -    Lab Results  Component  Value Date/Time   TSH 2.170 05/04/2020 09:47 AM   TSH 0.624 10/20/2018 02:50 AM   TSH 1.388 01/02/2018 06:09 PM   TSH 2.451  Test methodology is 3rd generation TSH  12/06/2008 04:55 AM    CBC Latest Ref Rng & Units 07/24/2021 07/23/2021 07/05/2021  WBC 4.0 - 10.5 K/uL 13.5(H) 10.8(H) 8.8  Hemoglobin 12.0 - 15.0 g/dL 7.6(L) 7.9(L) 9.0(L)  Hematocrit 36.0 - 46.0 % 26.8(L) 27.5(L) 29.4(L)  Platelets 150 - 400 K/uL 330 378 264.0    Lab Results  Component Value Date/Time   VD25OH 69.04 07/25/2017 11:55 AM    Clinical ASCVD: Yes  The 10-year ASCVD risk score (Arnett DK, et al., 2019) is: 7.8%   Values used to calculate the score:     Age: 27 years     Sex: Female     Is Non-Hispanic African American: No     Diabetic: Yes     Tobacco smoker: No     Systolic Blood Pressure: 118 mmHg     Is BP treated: Yes     HDL Cholesterol: 46.6 mg/dL     Total Cholesterol: 194 mg/dL     Social History   Tobacco Use  Smoking Status Former   Packs/day: 1.00   Years: 19.00   Pack years: 19.00   Types: Cigarettes   Start date: 12   Quit date: 02/11/2004   Years since quitting: 17.6   Passive exposure: Past  Smokeless Tobacco Never   BP Readings from Last 3 Encounters:  10/10/21 118/72  09/27/21 133/73  07/29/21 (!) 163/89   Pulse Readings from Last 3 Encounters:  10/10/21 (!) 102  09/27/21 (!) 101  07/29/21 84   Wt Readings from Last 3 Encounters:  09/27/21 291 lb (132 kg)  08/18/21 291 lb (132 kg)  07/22/21 291 lb (132 kg)    Assessment: Review of patient past medical history, allergies, medications, health status, including review of consultants reports, laboratory and other test data, was performed as part of comprehensive evaluation and provision of chronic care management services.   SDOH:  (Social Determinants of Health) assessments and interventions performed:    CCM Care Plan  Allergies  Allergen Reactions   Fish Allergy Anaphylaxis    INCLUDES OMEGA 3 OILS    Fish Oil Anaphylaxis and Swelling    THROAT SWELLS INCLUDES FISH AS A CLASS   Omega-3 Fatty Acids Swelling    Throat swelling  Has tolerated Glucerna nutritional drinks    Crestor [Rosuvastatin] Other (See Comments)    Extreme joint pain, to this & other statins   Gabapentin Anxiety    Anxiety on high doses   Lovastatin Other (See Comments)    EXTREME JOINT PAIN   Metronidazole Nausea Only and Swelling    Headache and shakes Nausea and vomiting   Red Yeast Rice [Cholestin] Other (See Comments)    Muscle pain and severe joint pain.    Rotigotine Swelling    (Neupro) Extreme edema    Atrovent Hfa [Ipratropium Bromide Hfa] Other (See Comments)  wheezing   Iodine Swelling   Other Other (See Comments)    Surgical Staples causes redness/infection per patient.   Pepto-Bismol [Bismuth Subsalicylate] Nausea And Vomiting    Extreme vomiting   Victoza [Liraglutide]     Unsure of reaction    Enalapril Cough   Suprep [Na Sulfate-K Sulfate-Mg Sulf] Nausea And Vomiting   Tape Other (See Comments)    Electrodes causes skin breakdown    Medications Reviewed Today     Reviewed by Francis Gaines, LCSW (Social Worker) on 10/14/21 at [place] List Status: <None>   Medication Order Taking? Sig Documenting Provider Last Dose Status Informant  albuterol (PROAIR HFA) 108 (90 Base) MCG/ACT inhaler [id number] No Inhale 2 puffs into the lungs every 4 (four) hours as needed for wheezing or shortness of breath. Shelda Pal, DO Taking Active   albuterol (PROVENTIL) (2.5 MG/3ML) 0.083% nebulizer solution [id number] No USE 1 VIAL IN NEBULIZER EVERY 4 HOURS AS NEEDED FOR WHEEZING FOR SHORTNESS OF BREATH Shelda Pal, DO Taking Active   ascorbic acid (VITAMIN C) 500 MG tablet [id number] No Take 1 tablet (500 mg total) by mouth 2 (two) times daily. Bary Leriche, PA-C Taking Active Self  aspirin EC 81 MG tablet [id number] No Take 81 mg by mouth every evening. [provider] Taking Active Self  betamethasone dipropionate 0.05 % cream [id number] No Apply 1 application topically 2 (two) times daily as needed (extreme itching). [provider] Taking Active Self  collagenase (SANTYL) ointment [id number] No Apply 1 application topically daily. Bary Leriche, PA-C Taking Active Self  cyclobenzaprine (FLEXERIL) 10 MG tablet [id number] No Take 1 tablet (10 mg total) by mouth 3 (three) times daily as needed for muscle spasms. Bary Leriche, PA-C Taking Active Self           Med Note Antony Contras, Lynelle Smoke B   Tue Aug 23, 2021 10:29 AM) Uses tizanidine OR cyclobenzaprine  diclofenac Sodium (VOLTAREN) 1 % GEL [id number] No Apply 2 g topically 4 (four) times daily. Bary Leriche, PA-C Taking Active Self           Med Note Luretha Rued   Tue Aug 09, 2021  2:24 PM) Reports using as needed.  DILT-XR 240 MG 24 hr capsule [id number] No Take 240 mg by mouth daily. [provider] Taking Active   EPINEPHrine (EPIPEN 2-PAK) 0.3 mg/0.3 mL IJ SOAJ injection [id number] No USE AS DIRECTED FOR SEVERE ALLERGIC REACTION. Shelda Pal, DO Taking Active   ezetimibe (ZETIA) 10 MG tablet [id number] No Take 1 tablet (10 mg total) by mouth daily. Shelda Pal, DO Taking Active   fluconazole (DIFLUCAN) 150 MG tablet [id number] No Take 1 tablet (150 mg total) by mouth every 3 (three) days. Shelda Pal, DO Taking Active   fluticasone [facility]) 50 MCG/ACT nasal spray [id number] No USE 2 SPRAY(S) IN EACH NOSTRIL ONCE DAILY AS NEEDED FOR  ALLERGIES  OR  RHINITIS Shelda Pal, DO Taking Active   fluticasone-salmeterol (ADVAIR) 250-50 MCG/ACT AEPB [id number] No Inhale 1 puff into the lungs in the morning and at bedtime. Shelda Pal, DO Taking Active   folic acid (FOLVITE) 1 MG tablet [id number] No Take 1,000 mcg by mouth daily at 12 noon. [provider] Taking Active Self  furosemide (LASIX) 20 MG tablet [id number] No Take 20-40 mg by  mouth 3 (three) times daily as needed (fluid retention/swelling). [provider] Taking Active Self  Med Note Juleen China, Deno Etienne   Tue Aug 09, 2021  2:29 PM) Reports takes 1-2 times a day as needed depending on weight > 5 pounds in a day.  gabapentin (NEURONTIN) 400 MG capsule [id number] No Take 1 capsule by mouth in the morning, 1 capsule at noon, and 2 capsules at bed time. Shelda Pal, DO Taking Active   GLUCOMANNAN PO [id number] No Take 1,995 mg by mouth in the morning, at noon, and at bedtime. [provider] Taking Active Self  glucose blood test strip [id number] No 1 each by Other route 4 (four) times daily. Contour Next strips Flora Lipps Taking Active Self  HUMULIN R 500 UNIT/ML injection [id number] No INJECT 0.08-0.3 MLS (40-150 UNITS TOTAL) INTO THE SKIN THREE TIMES DAILY WITH MEALS Shelda Pal, DO Taking Active   hydrocortisone cream 1 % [id number] No Apply 1 application topically 3 (three) times daily as needed for itching (minor skin irritation). Bary Leriche, PA-C Taking Active Self  hydroxychloroquine (PLAQUENIL) 200 MG tablet [id number] No Take 1 tablet (200 mg total) by mouth 2 (two) times daily. Shelda Pal, DO Taking Active Self  hydrOXYzine (ATARAX) 10 MG tablet [id number]  Take 1 tablet (10 mg total) by mouth 3 (three) times daily as needed. Suzan Slick, NP  Active   INSULIN SYRINGE 1CC/29G (B-D INSULIN SYRINGE) 29G X 1/2" 1 ML MISC [id number] No Use three times daily with insulin.  DX E11.9 Shelda Pal, DO Taking Active Self  isosorbide mononitrate (IMDUR) 30 MG 24 hr tablet [id number] No Take 1 tablet (30 mg total) by mouth daily.  Patient taking differently: Take 30 mg by mouth at bedtime.   Sueanne Margarita, MD Taking Active   levocetirizine (XYZAL) 5 MG tablet [id number] No TAKE 1 TABLET BY MOUTH ONCE DAILY IN THE EVENING [place], Crosby Oyster, DO Taking Active   levothyroxine (SYNTHROID) 200 MCG  tablet [id number] No TAKE 1 TABLET BY MOUTH ONCE DAILY BEFORE BREAKFAST Wendling, Crosby Oyster, DO Taking Active   metFORMIN (GLUCOPHAGE-XR) 500 MG 24 hr tablet [id number] No Take 2 tablets by mouth twice daily Shelda Pal, DO Taking Active   mupirocin ointment (BACTROBAN) 2 % [id number] No Apply 1 application topically 2 (two) times daily as needed (wound care). [provider] Taking Active Self  naloxone [facility]) nasal spray 4 mg/0.1 mL [id number] No Place 1 spray into the nose as needed (opoid overdose). [provider] Taking Active Self  nitroGLYCERIN (NITROSTAT) 0.4 MG SL tablet [id number] No DISSOLVE ONE TABLET UNDER THE TONGUE EVERY 5 MINUTES AS NEEDED FOR CHEST PAIN.  DO NOT EXCEED A TOTAL OF 3 DOSES IN 15 MINUTES Turner, Eber Hong, MD Taking Active   omeprazole (PRILOSEC) 40 MG capsule [id number] No Take 1 capsule (40 mg total) by mouth daily. Shelda Pal, DO Taking Active Self  Oxycodone HCl 20 MG TABS [id number] No Take 1 tablet (20 mg total) by mouth every 8 (eight) hours as needed. Bayard Hugger, NP Taking Active   oxymetazoline (AFRIN) 0.05 % nasal spray [id number] No Place 2 sprays into both nostrils 2 (two) times daily as needed. [provider] Taking Active Self  Polyethyl Glycol-Propyl Glycol (SYSTANE) 0.4-0.3 % SOLN [id number] No Place 1-2 drops into both eyes in the morning. [provider] Taking Active Self  POT BICARB-POT CHLORIDE,25MEQ, 25 MEQ TBEF [id number] No Take 25 mEq by mouth 3 (three) times daily as needed (with lasix). [provider] Taking Active Self  pramipexole (MIRAPEX) 0.5 MG tablet [id number] No Take 0.5 mg by mouth 2 (two) times daily. [provider] Taking Active   pramipexole (MIRAPEX) 1 MG tablet [id number] No Take 1 tablet (1 mg total) by mouth See admin instructions. Take 1 tablet (1 mg) by mouth scheduled 3 times daily & take 0.5 tablet (0.5 mg) by mouth twice daily if needed for  breakthrough restless leg Shelda Pal, DO Taking Active Self  pregabalin (LYRICA) 25 MG capsule [id number] No Take 1 capsule (25 mg total) by mouth 2 (two) times daily. Bayard Hugger, NP Taking Active   promethazine (PHENERGAN) 25 MG tablet [id number] No Take 1 tablet (25 mg total) by mouth every 8 (eight) hours as needed for vomiting or nausea. Shelda Pal, DO Taking Active Self  saccharomyces boulardii (FLORASTOR) 250 MG capsule [id number] No Take 250 mg by mouth 3 (three) times daily. [provider] Taking Active Self           Med Note Antony Contras, Lynelle Smoke B   Tue Aug 23, 2021 11:06 AM) Uses when on antibiotic  sulfamethoxazole-trimethoprim (BACTRIM DS) 800-160 MG tablet [id number]  Take 1 tablet by mouth 2 (two) times daily for 10 days. Shelda Pal, DO  Active   tiZANidine (ZANAFLEX) 2 MG tablet [id number] No Take 1 tablet (2 mg total) by mouth 3 (three) times daily. Shelda Pal, DO Taking Active   valsartan (DIOVAN) 160 MG tablet [id number] No Take 1 tablet (160 mg total) by mouth daily. Sueanne Margarita, MD Taking Active   vitamin B-12 (CYANOCOBALAMIN) 1000 MCG tablet [id number] No Take 1,000 mcg by mouth in the morning. [provider] Taking Active Self  Vitamin D, Ergocalciferol, (DRISDOL) 1.25 MG (50000 UNIT) CAPS capsule [id number] No TAKE 1 CAPSULE BY MOUTH ONCE A WEEK (EVERY  7  DAYS)  TAKE  ON  FRIDAY Shelda Pal, DO Taking Active   Zinc Sulfate 220 (50 Zn) MG TABS [id number] No Take 1 tablet (220 mg total) by mouth daily.  Patient taking differently: Take 220 mg by mouth at bedtime.   Bary Leriche, [place] Taking Active             Patient Active Problem List   Diagnosis Date Noted   Peripheral vascular disease, unspecified ([place]) 10/10/2021   Unilateral AKA, right ([place]) 10/10/2021   Hx of AKA (above knee amputation), right ([place]) 07/22/2021   Hx of BKA, right ([place]) 06/29/2021   Dehiscence of amputation stump  (HCC)    Acute blood loss anemia 06/24/2021   Postoperative wound infection 06/24/2021   UTI (urinary tract infection) 06/24/2021   Superficial dehiscence of operation wound 06/24/2021   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 ([place])    Bacteremia due to Enterococcus 05/28/2021   Sepsis ([place]) 05/26/2021   Digestive disorder 05/18/2021   Bilateral lower extremity edema 05/02/2021   Statin myopathy 03/04/2021   Aortic stenosis 03/04/2021   Sjogren's syndrome ([place]) 02/11/2020   Cutaneous abscess of right foot    Statin intolerance 08/16/2019   Gram-negative bacteremia 10/18/2018   Streptococcal bacteremia 10/18/2018   CAD S/P percutaneous coronary angioplasty 04/19/2018   Essential hypertension    Moderate persistent asthma 03/21/2018   NAFLD (nonalcoholic fatty liver disease)    Recurrent cellulitis of lower extremity 01/02/2018   Cellulitis of lower leg 01/02/2018   Chronic diarrhea 05/08/2017   H/O Clostridium difficile infection 05/08/2017   Rectal bleeding 05/08/2017   Hyperbilirubinemia 04/29/2016   Hyponatremia  04/29/2016   Cellulitis of right foot 03/09/2016   Mild persistent asthma 02/15/2016   Allergic rhinitis due to pollen 02/15/2016   Anaphylactic reaction due to food 02/10/2016   Diabetes mellitus type 2 in obese ([place]) 02/10/2016   Gastroesophageal reflux disease without esophagitis 02/10/2016   Atopic eczema 02/10/2016   Cervical nerve root disorder 12/15/2015   Lumbar radiculopathy 12/15/2015   Daytime somnolence 11/26/2015   Venous stasis dermatitis of both lower extremities 02/11/2015   Morbid obesity ([place]) 06/19/2014   Abnormal LFTs 03/26/2014   B-complex deficiency 03/26/2014   Benign essential HTN 03/26/2014   Chronic pain associated with significant psychosocial dysfunction 03/26/2014   Diaphragmatic hernia 03/26/2014   Gastroesophageal reflux disease 03/26/2014   Hammer toe 03/26/2014   H/O neoplasm 03/26/2014   Adaptive  colitis 03/26/2014   Diabetic polyneuropathy ([place]) 03/26/2014   Deafness, sensorineural 03/26/2014   Fibromyalgia 01/20/2014   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/20/2014   Hyperlipidemia 11/11/2012   Mixed connective tissue disease ([place]) 11/11/2012   Hypothyroidism 85/92/9244   Uncomplicated asthma 62/86/3817   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[place]) 02/20/2012   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   ANA positive 07/17/2011    Immunization History  Administered Date(s) Administered   Influenza,inj,Quad PF,6+ Mos 06/30/2015   Pneumococcal Polysaccharide-23 02/25/2014    Conditions to be addressed/monitored: CAD, HTN, HLD, DMII, Asthma, Hypothyroidism, and mixed connective tissue disease, chronic pain,   Care Plan : [place]  Updates made by Cherre Robins, RPH-CPP since 10/17/2021 12:00 AM     Problem: Diabetes; neuropathy; HDL; HTN; CAD;  chronic pain      Long-Range Goal: Provide education, support and care coordination for medication therapy and chronic conditions   Start Date: 08/23/2021  Priority: High  Note:   Current Barriers:  Unable to independently afford treatment regimen Unable to achieve control of hyperlipidemia and type 2 DM  Does not adhere to prescribed medication regimen  Pharmacist Clinical Goal(s):  Over the next 90 days, patient will verbalize ability to afford treatment regimen achieve control of type 2 DM and hyperlipidemia as evidenced by A1c < 7.0% and LDL <70 adhere to prescribed medication regimen as evidenced by refill history and attainment of treatment goals  through collaboration with PharmD and provider.   Interventions: 1:1 collaboration with Shelda Pal, DO regarding development and update of comprehensive plan of care as evidenced by provider attestation and co-signature Inter-disciplinary care team collaboration (see longitudinal plan of  care) Comprehensive medication review performed; medication list updated in electronic medical record  Diabetes: Uncontrolled; A1c improving; A1c goal < 7.0% Current treatment: Humulin R U-500 - inject 3 to 40 units 3 times a day Metformine ER $RemoveBefor'500mg'[id number]$  - take 2 tablets twice a day Previously has taken: Novolog and Semglee / glargine insulin Patient states insulin can sometimes cost over $300. Patient has high deductible health plan. Changed from [facility] to [employer] in 2023. Current glucose readings: 174, 84, 140, 249, 223 (occurred after she had a low of 70 and drank juice for hypoglycemia)  Denies hypoglycemic/hyperglycemic symptoms Current exercise: none at this time Interventions:  Mailed patient application for patient assistance program for [employer] for Humulin insulin after our last visit, she has not completed. Today she reports she has about 6 months of insulin. She will complete application once she is closer to needing Humulin again. Reminded patient to complete Application. Cost of Humulin U500 with her  new [employer] plan is $150 Reviewed hypoglycemia treatment.  Reviewed home blood glucose readings and reviewed goals  Fasting blood glucose goal (before meals) = 80 to 130 Blood glucose goal after a meal = less than 180   Hyperlipidemia / CAD: Uncontrolled; LDL goal <70 Adherence improving since our last visit. Current treatment: Ezetimibe 10mg  daily (last filled 09/21/2021 for 90 days) Aspirin 81mg  daily  Isosorbide ER 30mg  daily  Noted to be statin intolerant - Medications previously tried: rosuvastatin (extreme joint pain), lovastatin (joint pain), Red Yeast Rice (muscle and joint pain), Fish Oil (anaphylaxis and swelling); Nexlizet (stopped due to cost)  Interventions:  Sent prescription for aspirin - per her on ling formulary aspirin is covered by her [employer] plan with $0 Recommend recheck Lipids in 6 to 8 weeks.  Discussed LDL  goal  Hypertension:  Controlled; Last 2 of 3 office blood pressure readings have been at goal Blood pressure goal <140/90 Current therapy:  Valsartan 160mg  daily  Diltiazem 240mg  daily  Interventions:  Discussed blood pressure goals Continue current therapy for hypertension   Asthma: Uncontrolled; Goal: decrease number of days per month rescue inhaler is needed.  Current treatment: Albuterol nebulization or inhaler - use as needed up to every 4 hours as needed for wheezing and shortness of breath Fluticasone / salmeterol 250/96mcg - inhaler 1 puff twice a day (not taking due to cost)  Rescue inhaler use: uses very frequently; on a daily basis Maintenance inhaler: not currently prescribed. Per patient she has tried in past but maintenance inhalers have not been effective though she does also admit that cost has been an issue with adherence.  Past inhalers tried:  Xopenex (ineffective), Symbicort (ineffective/low adherence), Advair (ineffective/low adherence) Interventions:  Discussed benefits of maintenance inhalers in controlling asthma and preventing exacerbations.  Mailed application for Symbicort patient assistance program at last visit.  Reminded to complete Reviewed 2023 benefits with [employer]. Looks like Tour manager or HFA are tier 1. Messaged Dr Nani Ravens and he sent in prescription for Advair Discus 250/65mcg inhaler 1 puff into lungs twice a day but even as tier 1 (not tier 1 preventative) she has deductible to meet and cost is $110 per month. Will stick with plan to apply for patient assistance program for Symbicort.  Patient has application but has not completed yet. Reminded her to complete [place]  Medication management Pharmacist Clinical Goal(s): Over the next 90 days, patient will work with PharmD and providers to maintain optimal medication adherence Current pharmacy: [facility] Reviewed patient's 2023 formulary. Most medications on her list are tier  1 / or tier 1 preventative. Tier 1 preventative will be $0.  Patient states she lost 3 Septra tablets on her dog's pee pad. She is asking for a refill. Checked with Dr Nani Ravens and he OK 5 more day of therapy.  Interventions Comprehensive medication review performed. Reviewed refill history and assessed adherence Continue current medication management strategy Sent in prescription for generic Septra for twice a day for 5 more day #10 tablets.   Patient Goals/[name] Activities Over the next 90 days, patient will:  take medications as prescribed, check glucose 3 to 4 times per day, document, and provide at future appointments, collaborate with provider on medication access solutions, and complete patient assistance applications and return to [place] (Candas Deemer, clinical pharmacist)  Follow Up Plan: Telephone follow up appointment with care management team member scheduled for:  2 to 4 weeks.  Medication Assistance: Application for Humulin R and possible Symbicort (she will retry if approved for patient assistance program)   medication assistance program. in process.  Anticipated assistance start date 09/25/2021.  See plan of care for additional detail.  Patient's preferred pharmacy is:  [place], [place] - [place] [address] [place] [address] Phone: [phone] Fax: [phone]   Follow Up:  Patient agrees to Care Plan and Follow-up.  Plan: Telephone follow up appointment with care management team member scheduled for:  1 month  Cherre Robins, PharmD Clinical Pharmacist [facility]

## 2021-10-24 ENCOUNTER — Encounter: Payer: Self-pay | Admitting: Registered Nurse

## 2021-10-24 ENCOUNTER — Encounter (HOSPITAL_BASED_OUTPATIENT_CLINIC_OR_DEPARTMENT_OTHER): Payer: 59 | Admitting: Internal Medicine

## 2021-10-24 ENCOUNTER — Other Ambulatory Visit: Payer: Self-pay

## 2021-10-24 ENCOUNTER — Encounter (HOSPITAL_BASED_OUTPATIENT_CLINIC_OR_DEPARTMENT_OTHER): Payer: 59 | Admitting: Registered Nurse

## 2021-10-24 VITALS — BP 107/62 | HR 93 | Temp 98.2°F

## 2021-10-24 DIAGNOSIS — Z89611 Acquired absence of right leg above knee: Secondary | ICD-10-CM

## 2021-10-24 DIAGNOSIS — G8929 Other chronic pain: Secondary | ICD-10-CM | POA: Diagnosis not present

## 2021-10-24 DIAGNOSIS — M545 Low back pain, unspecified: Secondary | ICD-10-CM | POA: Diagnosis not present

## 2021-10-24 DIAGNOSIS — M2550 Pain in unspecified joint: Secondary | ICD-10-CM

## 2021-10-24 MED ORDER — OXYCODONE HCL 20 MG PO TABS: 1.0000 | ORAL_TABLET | Freq: Three times a day (TID) | ORAL | 0 refills | Status: DC | PRN

## 2021-10-24 NOTE — Progress Notes (Signed)
Subjective:    Patient ID: [name], female    DOB: [birth year], 62 y.o.   MRN: [id number]  HPI: [name] is a 61 y.o. female who returns for follow up appointment for chronic pain and medication refill. She states her pain is located in her neck, bilateral shoulders, lower back, right stump pain ( phantom pain) and generalized joint pain. rates pain. Her current exercise regime is walking and performing stretching exercises.  Ms. Niebla Morphine equivalent is 90.00 MME.   Last Oral Swab was Performed on 07/15/2021, see note for details.   Pain Inventory Average Pain 6 Pain Right Now 8 My pain is constant, sharp, stabbing, and aching  In the last 24 hours, has pain interfered with the following? General activity 2 Relation with others 2 Enjoyment of life 6 What TIME of day is your pain at its worst? night Sleep (in general) Poor  Pain is worse with: bending, sitting, and some activites Pain improves with: rest, heat/ice, and medication Relief from Meds: 6  Family History  Problem Relation Age of Onset   CAD Mother    Hypertension Mother    Heart attack Mother    CAD Father    Heart attack Father    Allergic rhinitis Father    Asthma Father    Hypertension Brother    Hypertension Brother    Pancreatic cancer Paternal Aunt    Breast cancer Paternal Aunt    Lung cancer Paternal Aunt    Asthma Son    Social History   Socioeconomic History   Marital status: Married    Spouse name: [name] Yaney   Number of children: 1   Years of education: 12   Highest education level: 12th grade  Occupational History   Not on file  Tobacco Use   Smoking status: Former    Packs/day: 1.00    Years: 19.00    Pack years: 19.00    Types: Cigarettes    Start date: 30    Quit date: 02/11/2004    Years since quitting: 17.7    Passive exposure: Past   Smokeless tobacco: Never  Vaping Use   Vaping Use: Never used  Substance and Sexual Activity   Alcohol use: Yes     Comment: RARE Wine   Drug use: Not Currently    Types: Marijuana    Comment: "only in my teens"   Sexual activity: Yes    Birth control/protection: Surgical    Comment: Hysterectomy  Other Topics Concern   Not on file  Social History Narrative   Not on file   Social Determinants of Health   Financial Resource Strain: High Risk   Difficulty of Paying Living Expenses: Very hard  Food Insecurity: Food Insecurity Present   Worried About Running Out of Food in the Last Year: Sometimes true   Ran Out of Food in the Last Year: Sometimes true  Transportation Needs: No Transportation Needs   Lack of Transportation (Medical): No   Lack of Transportation (Non-Medical): No  Physical Activity: Inactive   Days of Exercise per Week: 0 days   Minutes of Exercise per Session: 0 min  Stress: Stress Concern Present   Feeling of Stress : Very much  Social Connections: Moderately Integrated   Frequency of Communication with Friends and Family: More than three times a week   Frequency of Social Gatherings with Friends and Family: More than three times a week   Attends Religious Services: More than 4 times  per year   Active Member of Clubs or Organizations: No   Attends Banker Meetings: Never   Marital Status: Married   Past Surgical History:  Procedure Laterality Date   ABDOMINAL HYSTERECTOMY  06/2005   "w/right ovariy"   AMPUTATION Right 01/28/2020    RIGHT FOURTH AND FIFTH RAY AMPUTATION   AMPUTATION Right 01/28/2020   Procedure: RIGHT FOURTH AND FIFTH RAY AMPUTATION,;  Surgeon: [name], MD;  [facility]: [facility] OR;  Service: Orthopedics;  Laterality: Right;   AMPUTATION Right 06/01/2021   Procedure: RIGHT BELOW KNEE AMPUTATION;  Surgeon: [name], MD;  Location: [facility] OR;  Service: Orthopedics;  Laterality: Right;   AMPUTATION Right 07/22/2021   Procedure: RIGHT ABOVE KNEE AMPUTATION;  Surgeon: [name], MD;  Location: [facility] OR;  Service: Orthopedics;  Laterality: Right;    APPENDECTOMY     BREAST BIOPSY Bilateral    9 total (04/18/2018)   CORONARY ANGIOPLASTY WITH STENT PLACEMENT  10/01/2015   normal LM, 30% LAD, 85% mid RCA and 95% distal RCA s/p PCI of the mid to distal RCA and now on DAPT with ASA and Ticagrelor.     CORONARY STENT INTERVENTION Right 04/18/2018   Procedure: CORONARY STENT INTERVENTION;  Surgeon: [name], MD;  [facility];  Service: Cardiovascular;  Laterality: Right;   DILATION AND CURETTAGE OF UTERUS     FRACTURE SURGERY     LAPAROSCOPIC CHOLECYSTECTOMY     LEFT HEART CATH AND CORONARY ANGIOGRAPHY N/A 04/18/2018   Procedure: LEFT HEART CATH AND CORONARY ANGIOGRAPHY;  Surgeon: [name], MD;  [facility];  Service: Cardiovascular;  Laterality: N/A;   MUSCLE BIOPSY Left    "leg"   OOPHORECTOMY  07/2010   RADIOLOGY WITH ANESTHESIA N/A 05/25/2016   Procedure: RADIOLOGY WITH ANESTHESIA;  Surgeon: Medication Radiologist, MD;  Location: [facility] OR;  Service: Radiology;  Laterality: N/A;   STUMP REVISION Right 06/29/2021   Procedure: REVISION RIGHT BELOW KNEE AMPUTATION;  Surgeon: [name], MD;  Location: [facility] OR;  Service: Orthopedics;  Laterality: Right;   TEE WITHOUT CARDIOVERSION N/A 06/01/2021   Procedure: TRANSESOPHAGEAL ECHOCARDIOGRAM (TEE);  Surgeon: Sande Rives, MD;  Location: [facility] OR;  Service: Cardiovascular;  Laterality: N/A;   TONSILLECTOMY AND ADENOIDECTOMY     WRIST FRACTURE SURGERY Left    "crushed it"   Past Surgical History:  Procedure Laterality Date   ABDOMINAL HYSTERECTOMY  06/2005   "w/right ovariy"   AMPUTATION Right 01/28/2020    RIGHT FOURTH AND FIFTH RAY AMPUTATION   AMPUTATION Right 01/28/2020   Procedure: RIGHT FOURTH AND FIFTH RAY AMPUTATION,;  Surgeon: [name], MD;  Location: [facility] OR;  Service: Orthopedics;  Laterality: Right;   AMPUTATION Right 06/01/2021   Procedure: RIGHT BELOW KNEE AMPUTATION;  Surgeon: [name], MD;  Location: [facility]  OR;  Service: Orthopedics;  Laterality: Right;   AMPUTATION Right 07/22/2021   Procedure: RIGHT ABOVE KNEE AMPUTATION;  Surgeon: [name], MD;  Location: [facility] OR;  Service: Orthopedics;  Laterality: Right;   APPENDECTOMY     BREAST BIOPSY Bilateral    9 total (04/18/2018)   CORONARY ANGIOPLASTY WITH STENT PLACEMENT  10/01/2015   normal LM, 30% LAD, 85% mid RCA and 95% distal RCA s/p PCI of the mid to distal RCA and now on DAPT with ASA and Ticagrelor.     CORONARY STENT INTERVENTION Right 04/18/2018   Procedure: CORONARY STENT INTERVENTION;  Surgeon: Clifton James,  Annita Brod, MD;  Location: [place] CV LAB;  Service: Cardiovascular;  Laterality: Right;   DILATION AND CURETTAGE OF UTERUS     FRACTURE SURGERY     LAPAROSCOPIC CHOLECYSTECTOMY     LEFT HEART CATH AND CORONARY ANGIOGRAPHY N/A 04/18/2018   Procedure: LEFT HEART CATH AND CORONARY ANGIOGRAPHY;  Surgeon: Burnell Blanks, MD;  Location: [place] CV [facility];  Service: Cardiovascular;  Laterality: N/A;   MUSCLE BIOPSY Left    "leg"   OOPHORECTOMY  07/2010   RADIOLOGY WITH ANESTHESIA N/A 05/25/2016   Procedure: RADIOLOGY WITH ANESTHESIA;  Surgeon: Medication Radiologist, MD;  Location: [place];  Service: Radiology;  Laterality: N/A;   STUMP REVISION Right 06/29/2021   Procedure: REVISION RIGHT BELOW KNEE AMPUTATION;  Surgeon: Newt Minion, MD;  Location: [place];  Service: Orthopedics;  Laterality: Right;   TEE WITHOUT CARDIOVERSION N/A 06/01/2021   Procedure: TRANSESOPHAGEAL ECHOCARDIOGRAM (TEE);  Surgeon: Geralynn Rile, MD;  Location: [place];  Service: Cardiovascular;  Laterality: N/A;   TONSILLECTOMY AND ADENOIDECTOMY     WRIST FRACTURE SURGERY Left    "crushed it"   Past Medical History:  Diagnosis Date   Aortic stenosis    mild AS by echo 02/2021   Asthma    "on daily RX and rescue inhaler" (04/18/2018)   Chronic diastolic CHF (congestive heart failure) ([place]) 09/2015   Chronic lower back pain    Chronic neck pain     Chronic pain syndrome    Fentanyl Patch   Colon polyps    Coronary artery disease    cath with normal LM, 30% LAD, 85% mid RCA and 95% distal RCA s/p PCI of the mid to distal RCA and now on DAPT with ASA and Ticagrelor.     Eczema    Excessive daytime sleepiness 11/26/2015   Fibromyalgia    Gallstones    GERD (gastroesophageal reflux disease)    Heart murmur    "noted for the 1st time on 04/18/2018"   History of blood transfusion 07/2010   "S/P oophorectomy"   History of gout    History of hiatal hernia 1980s   "gone now" (04/18/2018)   Hyperlipidemia    Hypertension    takes Metoprolol and Enalapril daily   Hypothyroidism    takes Synthroid daily   IBS (irritable bowel syndrome)    Migraine    "nothing in the 2000s" (04/18/2018)   Mixed connective tissue disease ([place])    NAFLD (nonalcoholic fatty liver disease)    Pneumonia    "several times" (04/18/2018)   PVC's (premature ventricular contractions)    noted on event monitor 02/2021   Rheumatoid arthritis ([place])    "hands, elbows, shoulders, probably knees" (04/18/2018)   Scoliosis    Sleep apnea    mild - does not use cpap   Spondylosis    Type II diabetes mellitus ([place])    takes Metformin and Hum R daily (04/18/2018)   Walker as ambulation aid    also uses wheelchair   BP 107/62 (BP Location: Left Arm, Patient Position: Sitting, Cuff Size: Normal)    Pulse 93    Temp 98.2 F (36.8 C)    SpO2 94%   Opioid Risk Score:   Fall Risk Score:  `1  Depression screen PHQ 2/9  Depression screen [facility] 2/9 09/27/2021 08/25/2021 08/09/2021 07/15/2021 05/02/2021 10/27/2020  Decreased Interest 0 0 0 0 0 0  Down, Depressed, Hopeless 0 1 1 0 1 0  PHQ - 2 Score  0 1 1 0 1 0  Altered sleeping 0 - - 3 - -  Tired, decreased energy - - - 1 - -  Change in appetite - - - 1 - -  Feeling bad or failure about yourself  - - - 3 - -  Trouble concentrating - - - 0 - -  Moving slowly or fidgety/restless - - - 0 - -  Suicidal thoughts - - - 0 - -  PHQ-9  Score 0 - - 8 - -  Some recent data might be hidden    Review of Systems  Musculoskeletal:  Positive for back pain, gait problem and neck pain.       RIGHT STUMP/UPPER LEG  All other systems reviewed and are negative.     Objective:   Physical Exam Vitals and nursing note reviewed.  Constitutional:      Appearance: Normal appearance.  Cardiovascular:     Rate and Rhythm: Normal rate and regular rhythm.     Pulses: Normal pulses.     Heart sounds: Normal heart sounds.  Pulmonary:     Effort: Pulmonary effort is normal.     Breath sounds: Normal breath sounds. No stridor.  Musculoskeletal:     Cervical back: Normal range of motion and neck supple.     Comments: Normal Muscle Bulk and Muscle Testing Reveals:  Upper Extremities: Full ROM and Muscle Strength 5/5 Bilateral AC Joint Tenderness Lumbar Paraspinal Tenderness: L-3-L-5 Bilateral Greater Trochanter Tenderness Lower Extremities: Right: AKA Left Lower Extremity: Full ROM and Muscle Strength 5/5 Left Lower Extremity Flexion Produces Pain into Left Pateela Arrived in wheelchair    Skin:    General: Skin is warm and dry.  Neurological:     Mental Status: She is alert and oriented to person, place, and time.  Psychiatric:        Mood and Affect: Mood normal.        Behavior: Behavior normal.         Assessment & Plan:  HX: of Right AKA: Dr Sharol Given Following. Continue to Monitor. 10/24/2021 Diabetic Polyneuropathy associated with Type 2 DM: Continue current medication regimen with Lyrica. Continue to Monitor. 10/24/2021 DM Type 2: PCP Following. Continue current medication regimen. Continue to Monitor. 10/24/2021 Chronic  Pain Syndrome : Refilled: Oxycodone 20 mg one tablet every 8 hours as needed for pain #90. We will continue the opioid monitoring program, this consists of regular clinic visits, examinations, urine drug screen, pill counts as well as use of [place] Controlled Substance Reporting system. A 12 month  History has been reviewed on the [place] Controlled Substance Reporting System on 10/24/2021 Stage 1 Decubitus: Left Buttock and Left Lower Extremity: Continue Adequate Nutrition and Weight Shifting. [place] Following. Continue to Monitor.   F/U in 1 Month

## 2021-10-31 ENCOUNTER — Ambulatory Visit: Payer: 59 | Admitting: *Deleted

## 2021-10-31 ENCOUNTER — Encounter: Payer: Self-pay | Admitting: *Deleted

## 2021-10-31 DIAGNOSIS — E781 Pure hyperglyceridemia: Secondary | ICD-10-CM

## 2021-10-31 DIAGNOSIS — G894 Chronic pain syndrome: Secondary | ICD-10-CM

## 2021-10-31 DIAGNOSIS — L03115 Cellulitis of right lower limb: Secondary | ICD-10-CM

## 2021-10-31 DIAGNOSIS — I739 Peripheral vascular disease, unspecified: Secondary | ICD-10-CM

## 2021-10-31 DIAGNOSIS — E1169 Type 2 diabetes mellitus with other specified complication: Secondary | ICD-10-CM

## 2021-10-31 DIAGNOSIS — J4540 Moderate persistent asthma, uncomplicated: Secondary | ICD-10-CM

## 2021-10-31 DIAGNOSIS — T8781 Dehiscence of amputation stump: Secondary | ICD-10-CM

## 2021-10-31 DIAGNOSIS — L03119 Cellulitis of unspecified part of limb: Secondary | ICD-10-CM

## 2021-10-31 NOTE — Chronic Care Management (AMB) (Signed)
Care Management Clinical Social Work Note  10/31/2021 Name: [name] MRN: [id number] DOB: [birth year]  [name] is a 60 y.o. year old female who is a primary care patient of [name] Pal, DO.  The Care Management team was consulted for assistance with chronic disease management and coordination needs.  Engaged with patient by telephone for follow up visit in response to provider referral for social work chronic care management and care coordination services  Consent to Services:  [name] was given information about Care Management services today including:  Care Management services includes personalized support from designated clinical staff supervised by her physician, including individualized plan of care and coordination with other care providers 24/7 contact phone numbers for assistance for urgent and routine care needs. The patient may stop case management services at any time by phone call to the office staff.  Patient agreed to services and consent obtained.   Assessment: Review of patient past medical history, allergies, medications, and health status, including review of relevant consultants reports was performed today as part of a comprehensive evaluation and provision of chronic care management and care coordination services.  SDOH (Social Determinants of Health) assessments and interventions performed:    Advanced Directives Status: Not addressed in this encounter.  Care Plan  Allergies  Allergen Reactions   Fish Allergy Anaphylaxis    INCLUDES OMEGA 3 OILS   Fish Oil Anaphylaxis and Swelling    THROAT SWELLS INCLUDES FISH AS A CLASS   Omega-3 Fatty Acids Swelling    Throat swelling  Has tolerated Glucerna nutritional drinks    Crestor [Rosuvastatin] Other (See Comments)    Extreme joint pain, to this & other statins   Gabapentin Anxiety    Anxiety on high doses   Lovastatin Other (See Comments)    EXTREME JOINT PAIN   Metronidazole  Nausea Only and Swelling    Headache and shakes Nausea and vomiting   Red Yeast Rice [Cholestin] Other (See Comments)    Muscle pain and severe joint pain.    Rotigotine Swelling    (Neupro) Extreme edema    Atrovent Hfa [Ipratropium Bromide Hfa] Other (See Comments)    wheezing   Iodine Swelling   Other Other (See Comments)    Surgical Staples causes redness/infection per patient.   Pepto-Bismol [Bismuth Subsalicylate] Nausea And Vomiting    Extreme vomiting   Victoza [Liraglutide]     Unsure of reaction    Enalapril Cough   Suprep [Na Sulfate-K Sulfate-Mg Sulf] Nausea And Vomiting   Tape Other (See Comments)    Electrodes causes skin breakdown    Outpatient Encounter Medications as of 10/31/2021  Medication Sig Note   albuterol (PROAIR HFA) 108 (90 Base) MCG/ACT inhaler Inhale 2 puffs into the lungs every 4 (four) hours as needed for wheezing or shortness of breath.    albuterol (PROVENTIL) (2.5 MG/3ML) 0.083% nebulizer solution USE 1 VIAL IN NEBULIZER EVERY 4 HOURS AS NEEDED FOR WHEEZING FOR SHORTNESS OF BREATH    ascorbic acid (VITAMIN C) 500 MG tablet Take 1 tablet (500 mg total) by mouth 2 (two) times daily.    aspirin EC 81 MG tablet Take 1 tablet (81 mg total) by mouth every evening.    betamethasone dipropionate 0.05 % cream Apply 1 application topically 2 (two) times daily as needed (extreme itching).    collagenase (SANTYL) ointment Apply 1 application topically daily.    cyclobenzaprine (FLEXERIL) 10 MG tablet Take 1 tablet (10 mg total) by mouth  3 (three) times daily as needed for muscle spasms. 08/23/2021: Uses tizanidine OR cyclobenzaprine   diclofenac Sodium (VOLTAREN) 1 % GEL Apply 2 g topically 4 (four) times daily. 08/09/2021: Reports using as needed.   DILT-XR 240 MG 24 hr capsule Take 240 mg by mouth daily.    EPINEPHrine (EPIPEN 2-PAK) 0.3 mg/0.3 mL IJ SOAJ injection USE AS DIRECTED FOR SEVERE ALLERGIC REACTION.    ezetimibe (ZETIA) 10 MG tablet Take 1 tablet  (10 mg total) by mouth daily.    fluconazole (DIFLUCAN) 150 MG tablet Take 1 tablet (150 mg total) by mouth every 3 (three) days.    fluticasone (FLONASE) 50 MCG/ACT nasal spray USE 2 SPRAY(S) IN EACH NOSTRIL ONCE DAILY AS NEEDED FOR  ALLERGIES  OR  RHINITIS    fluticasone-salmeterol (ADVAIR) 250-50 MCG/ACT AEPB Inhale 1 puff into the lungs in the morning and at bedtime.    folic acid (FOLVITE) 1 MG tablet Take 1,000 mcg by mouth daily at 12 noon.    furosemide (LASIX) 20 MG tablet Take 20-40 mg by mouth 3 (three) times daily as needed (fluid retention/swelling). 08/09/2021: Reports takes 1-2 times a day as needed depending on weight > 5 pounds in a day.   gabapentin (NEURONTIN) 400 MG capsule Take 1 capsule by mouth in the morning, 1 capsule at noon, and 2 capsules at bed time.    GLUCOMANNAN PO Take 1,995 mg by mouth in the morning, at noon, and at bedtime.    glucose blood test strip 1 each by Other route 4 (four) times daily. Contour Next strips    HUMULIN R 500 UNIT/ML injection INJECT 0.08-0.3 MLS (40-150 UNITS TOTAL) INTO THE SKIN THREE TIMES DAILY WITH MEALS    hydrocortisone cream 1 % Apply 1 application topically 3 (three) times daily as needed for itching (minor skin irritation).    hydroxychloroquine (PLAQUENIL) 200 MG tablet Take 1 tablet (200 mg total) by mouth 2 (two) times daily.    hydrOXYzine (ATARAX) 10 MG tablet Take 1 tablet (10 mg total) by mouth 3 (three) times daily as needed.    INSULIN SYRINGE 1CC/29G (B-D INSULIN SYRINGE) 29G X 1/2" 1 ML MISC Use three times daily with insulin.  DX E11.9    isosorbide mononitrate (IMDUR) 30 MG 24 hr tablet Take 1 tablet (30 mg total) by mouth daily. (Patient taking differently: Take 30 mg by mouth at bedtime.)    levocetirizine (XYZAL) 5 MG tablet TAKE 1 TABLET BY MOUTH ONCE DAILY IN THE EVENING    levothyroxine (SYNTHROID) 200 MCG tablet TAKE 1 TABLET BY MOUTH ONCE DAILY BEFORE BREAKFAST    metFORMIN (GLUCOPHAGE-XR) 500 MG 24 hr tablet  Take 2 tablets by mouth twice daily    mupirocin ointment (BACTROBAN) 2 % Apply 1 application topically 2 (two) times daily as needed (wound care).    naloxone (NARCAN) nasal spray 4 mg/0.1 mL Place 1 spray into the nose as needed (opoid overdose).    nitroGLYCERIN (NITROSTAT) 0.4 MG SL tablet DISSOLVE ONE TABLET UNDER THE TONGUE EVERY 5 MINUTES AS NEEDED FOR CHEST PAIN.  DO NOT EXCEED A TOTAL OF 3 DOSES IN 15 MINUTES    omeprazole (PRILOSEC) 40 MG capsule Take 1 capsule (40 mg total) by mouth daily.    Oxycodone HCl 20 MG TABS Take 1 tablet (20 mg total) by mouth every 8 (eight) hours as needed. 10/24/2021: LAST TAKEN TODAY ON HAND #7 LAST FILLED ON 09/27/2021 FOR 90   oxymetazoline (AFRIN) 0.05 % nasal spray Place  2 sprays into both nostrils 2 (two) times daily as needed.    Polyethyl Glycol-Propyl Glycol (SYSTANE) 0.4-0.3 % SOLN Place 1-2 drops into both eyes in the morning.    POT BICARB-POT CHLORIDE,25MEQ, 25 MEQ TBEF Take 25 mEq by mouth 3 (three) times daily as needed (with lasix).    pramipexole (MIRAPEX) 0.5 MG tablet Take 0.5 mg by mouth 2 (two) times daily.    pramipexole (MIRAPEX) 1 MG tablet Take 1 tablet (1 mg total) by mouth See admin instructions. Take 1 tablet (1 mg) by mouth scheduled 3 times daily & take 0.5 tablet (0.5 mg) by mouth twice daily if needed for breakthrough restless leg    pregabalin (LYRICA) 25 MG capsule Take 1 capsule (25 mg total) by mouth 2 (two) times daily.    promethazine (PHENERGAN) 25 MG tablet Take 1 tablet (25 mg total) by mouth every 8 (eight) hours as needed for vomiting or nausea.    saccharomyces boulardii (FLORASTOR) 250 MG capsule Take 250 mg by mouth 3 (three) times daily. 08/23/2021: Uses when on antibiotic   tiZANidine (ZANAFLEX) 2 MG tablet Take 1 tablet (2 mg total) by mouth 3 (three) times daily.    valsartan (DIOVAN) 160 MG tablet Take 1 tablet (160 mg total) by mouth daily.    vitamin B-12 (CYANOCOBALAMIN) 1000 MCG tablet Take 1,000 mcg by  mouth in the morning.    Vitamin D, Ergocalciferol, (DRISDOL) 1.25 MG (50000 UNIT) CAPS capsule TAKE 1 CAPSULE BY MOUTH ONCE A WEEK (EVERY  7  DAYS)  TAKE  ON  FRIDAY    Zinc Sulfate 220 (50 Zn) MG TABS Take 1 tablet (220 mg total) by mouth daily. (Patient taking differently: Take 220 mg by mouth at bedtime.)    No facility-administered encounter medications on file as of 10/31/2021.    Patient Active Problem List   Diagnosis Date Noted   Peripheral vascular disease, unspecified ([place]) 10/10/2021   Unilateral AKA, right ([place]) 10/10/2021   Hx of AKA (above knee amputation), right ([place]) 07/22/2021   Hx of BKA, right ([place]) 06/29/2021   Dehiscence of amputation stump (H[facility])    Acute blood loss anemia 06/24/2021   Postoperative wound infection 06/24/2021   UTI (urinary tract infection) 06/24/2021   Superficial dehiscence of operation wound 06/24/2021   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 ([place])    Bacteremia due to Enterococcus 05/28/2021   Sepsis ([place]) 05/26/2021   Digestive disorder 05/18/2021   Bilateral lower extremity edema 05/02/2021   Statin myopathy 03/04/2021   Aortic stenosis 03/04/2021   Sjogren's syndrome ([place]) 02/11/2020   Cutaneous abscess of right foot    Statin intolerance 08/16/2019   Gram-negative bacteremia 10/18/2018   Streptococcal bacteremia 10/18/2018   CAD S/P percutaneous coronary angioplasty 04/19/2018   Essential hypertension    Moderate persistent asthma 03/21/2018   NAFLD (nonalcoholic fatty liver disease)    Recurrent cellulitis of lower extremity 01/02/2018   Cellulitis of lower leg 01/02/2018   Chronic diarrhea 05/08/2017   H/O Clostridium difficile infection 05/08/2017   Rectal bleeding 05/08/2017   Hyperbilirubinemia 04/29/2016   Hyponatremia 04/29/2016   Cellulitis of right foot 03/09/2016   Mild persistent asthma 02/15/2016   Allergic rhinitis due to pollen 02/15/2016   Anaphylactic reaction due to food 02/10/2016    Diabetes mellitus type 2 in obese ([place]) 02/10/2016   Gastroesophageal reflux disease without esophagitis 02/10/2016   Atopic eczema 02/10/2016   Cervical nerve root disorder 12/15/2015   Lumbar radiculopathy  12/15/2015   Daytime somnolence 11/26/2015   Venous stasis dermatitis of both lower extremities 02/11/2015   Morbid obesity ([place]) 06/19/2014   Abnormal LFTs 03/26/2014   B-complex deficiency 03/26/2014   Benign essential HTN 03/26/2014   Chronic pain associated with significant psychosocial dysfunction 03/26/2014   Diaphragmatic hernia 03/26/2014   Gastroesophageal reflux disease 03/26/2014   Hammer toe 03/26/2014   H/O neoplasm 03/26/2014   Adaptive colitis 03/26/2014   Diabetic polyneuropathy ([place]) 03/26/2014   Deafness, sensorineural 03/26/2014   Fibromyalgia 01/20/2014   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/20/2014   Hyperlipidemia 11/11/2012   Mixed connective tissue disease ([place]) 11/11/2012   Hypothyroidism 99991111   Uncomplicated asthma [id number]   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[place]) 02/20/2012   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  Film/video editor, ADL/IADL Limitations, Limited Access to Caregiver, and Lacks Knowledge of [employer].  Care Plan : LCSW Follow-Up Outreach Call.  Updates made by Francis Gaines, LCSW since 10/31/2021 12:00 AM     Problem: Find Help in My Community.   Priority: High     Goal: Find Help in My Community.   Start Date: 08/25/2021  Expected End Date: 11/23/2021  This Visit's Progress: On track  Recent Progress: On track  Priority: High  Note:   Current Barriers:   Financial Constraints related to Inability to Work, due to Chronic Medical Conditions and recent Above the Knee Amputation, Inadequate Insurance Coverage, and Outstanding Medical Bills/Expenses. Lacks  knowledge of community resources. Clinical Goals:  Patient will work with LCSW, and [employer], in an effort to obtain financial assistance for [employer], medical expenses, outstanding medical bills, food, and utilities.   Patient will apply for Social Security Disability, Adult Medicaid, [place], Low-Income Energy Assistance, and Supplemental Nutrition Assistance. Interventions:  Collaboration with Primary Care Physician, Dr. [name] regarding development and update of comprehensive plan of care as evidenced by provider attestation and co-signature. Inter-disciplinary care team collaboration (see longitudinal plan of care). Clinical Interventions: Solution-Focused Strategies implemented, Active Listening/Reflection utilized, Emotional Support provided, Participation in Counseling encouraged, Participation in Support Groups addressed, Increase in Actives/Exercise emphasized, Verbalization of Feelings discussed. Patient Goals/[name] Activities:  Continue working with [employer], and [employer], on a bi-weekly basis, in an effort to obtain Social Security Disability, Adult Medicaid, [employer], Human resources officer, and Supplemental Nutrition Assistance. Begin reviewing and contacting agencies of interest, from the following list of resources and applications provided, re-emailed and mailed to your home on 10/31/2021:    ~ [place] Application ~ Adult Insurance underwriter ~ [place] Program Application ~ [place] Application ~ List of Emergency Assistance Programs ~ List of Food [place], Food Pantries and [place] ~ [place] Resources ~ [place] to have your husband perform wound care and dressing changes in the home, as healing  continues to take place.    Contact LCSW directly (# [phone]) if you have questions, need assistance, or if additional social work needs are identified between now and our next scheduled telephone outreach call. Follow-Up Date:  11/14/2021 at 10:45 am   [place] Clinical Social Worker [place] [phone]

## 2021-10-31 NOTE — Patient Instructions (Signed)
Visit Information  Thank you for taking time to visit with me today. Please don't hesitate to contact me if I can be of assistance to you before our next scheduled telephone appointment.  Following are the goals we discussed today:  Patient Goals/[name] Activities:  Continue working with [employer], and [employer], on a bi-weekly basis, in an effort to obtain Social Security Disability, Adult Medicaid, [employer], Product/process development scientist, and Supplemental Nutrition Assistance. Begin reviewing and contacting agencies of interest, from the following list of resources and applications provided, re-emailed and mailed to your home on 10/31/2021:    ~ Social Security Disability Application ~ Adult Personal assistant ~ [employer] Financial Assistance Program Application ~ Low-Income Energy Assistance Program Application ~ Supplemental Nutrition Assistance Program Application ~ List of Emergency Assistance Programs ~ List of Food [place], Food Pantries and Soup Kitchen ~ [place] [place] Resources ~ [employer] Continue to have your husband perform wound care and dressing changes in the home, as healing continues to take place.    Contact LCSW directly (# [phone]) if you have questions, need assistance, or if additional social work needs are identified between now and our next scheduled telephone outreach call. Follow-Up Date:  11/14/2021 at 10:45 am  Please call the care guide team at [phone] if you need to cancel or reschedule your appointment.   If you are experiencing a Mental Health or Behavioral Health Crisis or need someone to talk to, please call the Suicide and Crisis Lifeline: 988 call the [place] National Suicide Prevention Lifeline: (854) 241-3855 or TTY: 915-535-3773 TTY (931) 330-7636) to talk to a trained counselor call 1-800-273-TALK (toll free, 24 hour hotline) go to [facility] [address] [place] [phone]) call the [facility] Crisis Line: [phone] call 911   Patient verbalizes understanding of instructions and care plan provided today and agrees to view in MyChart. Active MyChart status confirmed with patient.    Danford Bad LCSW Licensed Clinical Social Worker [facility] Med [employer] [phone]

## 2021-11-01 ENCOUNTER — Ambulatory Visit: Payer: Self-pay | Admitting: Gastroenterology

## 2021-11-01 ENCOUNTER — Telehealth: Payer: Self-pay | Admitting: Gastroenterology

## 2021-11-01 NOTE — Telephone Encounter (Signed)
Good Morning Dr. Barron Alvineirigliano,   Patients husband called to cancel appointment today at 1:20 due to patient being sick.   Stated that patient will call back at a later time to reschedule.

## 2021-11-09 ENCOUNTER — Ambulatory Visit: Payer: 59 | Admitting: Family

## 2021-11-14 ENCOUNTER — Encounter: Payer: Self-pay | Admitting: Family Medicine

## 2021-11-14 ENCOUNTER — Ambulatory Visit: Payer: 59 | Admitting: *Deleted

## 2021-11-14 DIAGNOSIS — M797 Fibromyalgia: Secondary | ICD-10-CM

## 2021-11-14 DIAGNOSIS — I739 Peripheral vascular disease, unspecified: Secondary | ICD-10-CM

## 2021-11-14 DIAGNOSIS — J4540 Moderate persistent asthma, uncomplicated: Secondary | ICD-10-CM

## 2021-11-14 DIAGNOSIS — E781 Pure hyperglyceridemia: Secondary | ICD-10-CM

## 2021-11-14 DIAGNOSIS — I10 Essential (primary) hypertension: Secondary | ICD-10-CM

## 2021-11-14 DIAGNOSIS — L03119 Cellulitis of unspecified part of limb: Secondary | ICD-10-CM

## 2021-11-14 DIAGNOSIS — E1169 Type 2 diabetes mellitus with other specified complication: Secondary | ICD-10-CM

## 2021-11-14 DIAGNOSIS — L03115 Cellulitis of right lower limb: Secondary | ICD-10-CM

## 2021-11-14 DIAGNOSIS — G894 Chronic pain syndrome: Secondary | ICD-10-CM

## 2021-11-14 MED ORDER — MUPIROCIN 2 % EX OINT: 1.0000 "application " | TOPICAL_OINTMENT | Freq: Two times a day (BID) | CUTANEOUS | 3 refills | Status: DC | PRN

## 2021-11-14 NOTE — Chronic Care Management (AMB) (Signed)
Care Management Clinical Social Work Note  11/14/2021 Name: [name] MRN: [id number] DOB: Jan 26, 1961  [name] is a 61 y.o. year old female who is a primary care patient of Shelda Pal, DO.  The Care Management team was consulted for assistance with chronic disease management and coordination needs.  Engaged with patient by telephone for follow up visit in response to provider referral for social work chronic care management and care coordination services  Consent to Services:  Ms. Dalton was given information about Care Management services today including:  Care Management services includes personalized support from designated clinical staff supervised by her physician, including individualized plan of care and coordination with other care providers 24/7 contact phone numbers for assistance for urgent and routine care needs. The patient may stop case management services at any time by phone call to the office staff.  Patient agreed to services and consent obtained.   Assessment: Review of patient past medical history, allergies, medications, and health status, including review of relevant consultants reports was performed today as part of a comprehensive evaluation and provision of chronic care management and care coordination services.  SDOH (Social Determinants of Health) assessments and interventions performed:    Advanced Directives Status: Not addressed in this encounter.  Care Plan  Allergies  Allergen Reactions   Fish Allergy Anaphylaxis    INCLUDES OMEGA 3 OILS   Fish Oil Anaphylaxis and Swelling    THROAT SWELLS INCLUDES FISH AS A CLASS   Omega-3 Fatty Acids Swelling    Throat swelling  Has tolerated Glucerna nutritional drinks    Crestor [Rosuvastatin] Other (See Comments)    Extreme joint pain, to this & other statins   Gabapentin Anxiety    Anxiety on high doses   Lovastatin Other (See Comments)    EXTREME JOINT PAIN   Metronidazole Nausea  Only and Swelling    Headache and shakes Nausea and vomiting   Red Yeast Rice [Cholestin] Other (See Comments)    Muscle pain and severe joint pain.    Rotigotine Swelling    (Neupro) Extreme edema    Atrovent Hfa [Ipratropium Bromide Hfa] Other (See Comments)    wheezing   Iodine Swelling   Other Other (See Comments)    Surgical Staples causes redness/infection per patient.   Pepto-Bismol [Bismuth Subsalicylate] Nausea And Vomiting    Extreme vomiting   Victoza [Liraglutide]     Unsure of reaction    Enalapril Cough   Suprep [Na Sulfate-K Sulfate-Mg Sulf] Nausea And Vomiting   Tape Other (See Comments)    Electrodes causes skin breakdown    Outpatient Encounter Medications as of 11/14/2021  Medication Sig Note   albuterol (PROAIR HFA) 108 (90 Base) MCG/ACT inhaler Inhale 2 puffs into the lungs every 4 (four) hours as needed for wheezing or shortness of breath.    albuterol (PROVENTIL) (2.5 MG/3ML) 0.083% nebulizer solution USE 1 VIAL IN NEBULIZER EVERY 4 HOURS AS NEEDED FOR WHEEZING FOR SHORTNESS OF BREATH    ascorbic acid (VITAMIN C) 500 MG tablet Take 1 tablet (500 mg total) by mouth 2 (two) times daily.    aspirin EC 81 MG tablet Take 1 tablet (81 mg total) by mouth every evening.    betamethasone dipropionate 0.05 % cream Apply 1 application topically 2 (two) times daily as needed (extreme itching).    collagenase (SANTYL) ointment Apply 1 application topically daily.    cyclobenzaprine (FLEXERIL) 10 MG tablet Take 1 tablet (10 mg total) by mouth  3 (three) times daily as needed for muscle spasms. 08/23/2021: Uses tizanidine OR cyclobenzaprine   diclofenac Sodium (VOLTAREN) 1 % GEL Apply 2 g topically 4 (four) times daily. 08/09/2021: Reports using as needed.   DILT-XR 240 MG 24 hr capsule Take 240 mg by mouth daily.    EPINEPHrine (EPIPEN 2-PAK) 0.3 mg/0.3 mL IJ SOAJ injection USE AS DIRECTED FOR SEVERE ALLERGIC REACTION.    ezetimibe (ZETIA) 10 MG tablet Take 1 tablet (10 mg  total) by mouth daily.    fluconazole (DIFLUCAN) 150 MG tablet Take 1 tablet (150 mg total) by mouth every 3 (three) days.    fluticasone (FLONASE) 50 MCG/ACT nasal spray USE 2 SPRAY(S) IN EACH NOSTRIL ONCE DAILY AS NEEDED FOR  ALLERGIES  OR  RHINITIS    fluticasone-salmeterol (ADVAIR) 250-50 MCG/ACT AEPB Inhale 1 puff into the lungs in the morning and at bedtime.    folic acid (FOLVITE) 1 MG tablet Take 1,000 mcg by mouth daily at 12 noon.    furosemide (LASIX) 20 MG tablet Take 20-40 mg by mouth 3 (three) times daily as needed (fluid retention/swelling). 08/09/2021: Reports takes 1-2 times a day as needed depending on weight > 5 pounds in a day.   gabapentin (NEURONTIN) 400 MG capsule Take 1 capsule by mouth in the morning, 1 capsule at noon, and 2 capsules at bed time.    GLUCOMANNAN PO Take 1,995 mg by mouth in the morning, at noon, and at bedtime.    glucose blood test strip 1 each by Other route 4 (four) times daily. Contour Next strips    HUMULIN R 500 UNIT/ML injection INJECT 0.08-0.3 MLS (40-150 UNITS TOTAL) INTO THE SKIN THREE TIMES DAILY WITH MEALS    hydrocortisone cream 1 % Apply 1 application topically 3 (three) times daily as needed for itching (minor skin irritation).    hydroxychloroquine (PLAQUENIL) 200 MG tablet Take 1 tablet (200 mg total) by mouth 2 (two) times daily.    hydrOXYzine (ATARAX) 10 MG tablet Take 1 tablet (10 mg total) by mouth 3 (three) times daily as needed.    INSULIN SYRINGE 1CC/29G (B-D INSULIN SYRINGE) 29G X 1/2" 1 ML MISC Use three times daily with insulin.  DX E11.9    isosorbide mononitrate (IMDUR) 30 MG 24 hr tablet Take 1 tablet (30 mg total) by mouth daily. (Patient taking differently: Take 30 mg by mouth at bedtime.)    levocetirizine (XYZAL) 5 MG tablet TAKE 1 TABLET BY MOUTH ONCE DAILY IN THE EVENING    levothyroxine (SYNTHROID) 200 MCG tablet TAKE 1 TABLET BY MOUTH ONCE DAILY BEFORE BREAKFAST    metFORMIN (GLUCOPHAGE-XR) 500 MG 24 hr tablet Take 2  tablets by mouth twice daily    mupirocin ointment (BACTROBAN) 2 % Apply 1 application topically 2 (two) times daily as needed (wound care).    naloxone (NARCAN) nasal spray 4 mg/0.1 mL Place 1 spray into the nose as needed (opoid overdose).    nitroGLYCERIN (NITROSTAT) 0.4 MG SL tablet DISSOLVE ONE TABLET UNDER THE TONGUE EVERY 5 MINUTES AS NEEDED FOR CHEST PAIN.  DO NOT EXCEED A TOTAL OF 3 DOSES IN 15 MINUTES    omeprazole (PRILOSEC) 40 MG capsule Take 1 capsule (40 mg total) by mouth daily.    Oxycodone HCl 20 MG TABS Take 1 tablet (20 mg total) by mouth every 8 (eight) hours as needed. 10/24/2021: LAST TAKEN TODAY ON HAND #7 LAST FILLED ON 09/27/2021 FOR 90   oxymetazoline (AFRIN) 0.05 % nasal spray Place  2 sprays into both nostrils 2 (two) times daily as needed.    Polyethyl Glycol-Propyl Glycol (SYSTANE) 0.4-0.3 % SOLN Place 1-2 drops into both eyes in the morning.    POT BICARB-POT CHLORIDE,25MEQ, 25 MEQ TBEF Take 25 mEq by mouth 3 (three) times daily as needed (with lasix).    pramipexole (MIRAPEX) 0.5 MG tablet Take 0.5 mg by mouth 2 (two) times daily.    pramipexole (MIRAPEX) 1 MG tablet Take 1 tablet (1 mg total) by mouth See admin instructions. Take 1 tablet (1 mg) by mouth scheduled 3 times daily & take 0.5 tablet (0.5 mg) by mouth twice daily if needed for breakthrough restless leg    pregabalin (LYRICA) 25 MG capsule Take 1 capsule (25 mg total) by mouth 2 (two) times daily.    promethazine (PHENERGAN) 25 MG tablet Take 1 tablet (25 mg total) by mouth every 8 (eight) hours as needed for vomiting or nausea.    saccharomyces boulardii (FLORASTOR) 250 MG capsule Take 250 mg by mouth 3 (three) times daily. 08/23/2021: Uses when on antibiotic   tiZANidine (ZANAFLEX) 2 MG tablet Take 1 tablet (2 mg total) by mouth 3 (three) times daily.    valsartan (DIOVAN) 160 MG tablet Take 1 tablet (160 mg total) by mouth daily.    vitamin B-12 (CYANOCOBALAMIN) 1000 MCG tablet Take 1,000 mcg by mouth in  the morning.    Vitamin D, Ergocalciferol, (DRISDOL) 1.25 MG (50000 UNIT) CAPS capsule TAKE 1 CAPSULE BY MOUTH ONCE A WEEK (EVERY  7  DAYS)  TAKE  ON  FRIDAY    Zinc Sulfate 220 (50 Zn) MG TABS Take 1 tablet (220 mg total) by mouth daily. (Patient taking differently: Take 220 mg by mouth at bedtime.)    No facility-administered encounter medications on file as of 11/14/2021.    Patient Active Problem List   Diagnosis Date Noted   Peripheral vascular disease, unspecified ([place]) 10/10/2021   Unilateral AKA, right ([place]) 10/10/2021   Hx of AKA (above knee amputation), right ([place]) 07/22/2021   Hx of BKA, right ([place]) 06/29/2021   Dehiscence of amputation stump (H[facility])    Acute blood loss anemia 06/24/2021   Postoperative wound infection 06/24/2021   UTI (urinary tract infection) 06/24/2021   Superficial dehiscence of operation wound 06/24/2021   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 ([place])    Bacteremia due to Enterococcus 05/28/2021   Sepsis ([place]) 05/26/2021   Digestive disorder 05/18/2021   Bilateral lower extremity edema 05/02/2021   Statin myopathy 03/04/2021   Aortic stenosis 03/04/2021   Sjogren's syndrome ([place]) 02/11/2020   Cutaneous abscess of right foot    Statin intolerance 08/16/2019   Gram-negative bacteremia 10/18/2018   Streptococcal bacteremia 10/18/2018   CAD S/P percutaneous coronary angioplasty 04/19/2018   Essential hypertension    Moderate persistent asthma 03/21/2018   NAFLD (nonalcoholic fatty liver disease)    Recurrent cellulitis of lower extremity 01/02/2018   Cellulitis of lower leg 01/02/2018   Chronic diarrhea 05/08/2017   H/O Clostridium difficile infection 05/08/2017   Rectal bleeding 05/08/2017   Hyperbilirubinemia 04/29/2016   Hyponatremia 04/29/2016   Cellulitis of right foot 03/09/2016   Mild persistent asthma 02/15/2016   Allergic rhinitis due to pollen 02/15/2016   Anaphylactic reaction due to food 02/10/2016   Diabetes  mellitus type 2 in obese ([place]) 02/10/2016   Gastroesophageal reflux disease without esophagitis 02/10/2016   Atopic eczema 02/10/2016   Cervical nerve root disorder 12/15/2015   Lumbar radiculopathy  12/15/2015   Daytime somnolence 11/26/2015   Venous stasis dermatitis of both lower extremities 02/11/2015   Morbid obesity ([place]) 06/19/2014   Abnormal LFTs 03/26/2014   B-complex deficiency 03/26/2014   Benign essential HTN 03/26/2014   Chronic pain associated with significant psychosocial dysfunction 03/26/2014   Diaphragmatic hernia 03/26/2014   Gastroesophageal reflux disease 03/26/2014   Hammer toe 03/26/2014   H/O neoplasm 03/26/2014   Adaptive colitis 03/26/2014   Diabetic polyneuropathy ([place]) 03/26/2014   Deafness, sensorineural 03/26/2014   Fibromyalgia 01/20/2014   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/20/2014   Hyperlipidemia 11/11/2012   Mixed connective tissue disease ([place]) 11/11/2012   Hypothyroidism 99991111   Uncomplicated asthma [id number]   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[place]) 02/20/2012   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  Film/video editor, Limited Social Support, Limited Access to [employer], Level of Care Concerns, ADL/IADL Limitations, Mental Health Concerns, Social Isolation, Limited Access to Caregiver, and Teacher, English as a foreign language of [employer].  Care Plan : LCSW Follow-Up Outreach Call.  Updates made by Francis Gaines, LCSW since 11/14/2021 12:00 AM     Problem: Find Help in My Community.   Priority: High     Goal: Find Help in My Community.   Start Date: 08/25/2021  Expected End Date: 11/28/2021  This Visit's Progress: On track  Recent Progress: On track  Priority: High  Note:   Current Barriers:   Financial Constraints related to Inability to Work, due to  Chronic Medical Conditions and recent Above the Knee Amputation, Inadequate Insurance Coverage, and Outstanding Medical Bills/Expenses. Lacks knowledge of community resources. Clinical Goals:  Patient will work with LCSW, and [employer], in an effort to obtain financial assistance for [employer], medical expenses, outstanding medical bills, food, and utilities.   Patient will apply for Social Security Disability, Adult Medicaid, Actor, Low-Income Energy Assistance, and Supplemental Nutrition Assistance. Interventions:  Collaboration with Primary Care Physician, Dr. [name] regarding development and update of comprehensive plan of care as evidenced by provider attestation and co-signature. Inter-disciplinary care team collaboration (see longitudinal plan of care). Clinical Interventions: Solution-Focused Strategies implemented. Active Listening/Reflection utilized. Emotional Support provided. Participation in Counseling encouraged. Verbalization of Feelings discussed. Deep Breathing Exercises, Relaxation Techniques, and Mindfulness Meditation Strategies reinforced daily. Patient Goals/[name] Activities:  Continue working with [employer], and [employer], on a bi-weekly basis, in an effort to obtain Social Security Disability, Adult Medicaid, [employer], Human resources officer, and Supplemental Nutrition Assistance. Thorough review and partial completion of applications for:    ~ Social Security Disability ~ Adult Medicaid ~ [place] Program ~ [place]  ~ [place]  Be prepared to continue completion of applications for Disability, Medicaid, [employer], Energy Assistance, and Nutrition Assistance, during next scheduled telephone outreach call. Continue to review and utilize agencies of interest, from the following list of  resources provided: ~ Emergency Assistance Programs ~ [place] ~ [place] ~ [employer] LCSW directly [phone]), if you have questions, need assistance, or if additional social work needs are identified in the near future.   Follow-Up Date:  11/28/2021 at 10:45 am   [place] Clinical Social Worker [place] [phone]

## 2021-11-14 NOTE — Patient Instructions (Signed)
Visit Information ? ?Thank you for taking time to visit with me today. Please don't hesitate to contact me if I can be of assistance to you before our next scheduled telephone appointment. ? ?Following are the goals we discussed today:  ?Patient Goals/[name] Activities:  ?Continue working with Johnson [employer] JohnsonLCSW, and [employer] Care Guides, on a bi-weekly basis, in an effort to obtain Social Security Disability, Adult Medicaid, [facility] Financial Assistance, Product/process development scientistLow-Income Energy Assistance, and Supplemental Nutrition Assistance. ?Thorough review and partial completion of applications for:   ? ~ Social Security Disability ?~ Adult Medicaid ?~ [facility] Financial Assistance Program ?~ AT&TLow-Income Energy Assistance Program  ?~ Psychologist, educationalupplemental Nutrition Assistance Program  ?Be prepared to continue completion of applications for Disability, Medicaid, [facility], Energy Assistance, and Nutrition Assistance, during next scheduled telephone outreach call. ?Continue to review and utilize agencies of interest, from the following list of resources provided: ?~ Emergency Assistance Programs ?~ Armed forces logistics/support/administrative officerood Banks, Stage managerood Pantries and Soup Kitchens ?~ [place] [employer] [place] Resources ?~ [place] Assistance Programs   ?Contact LCSW directly (# [phone]), if you have questions, need assistance, or if additional social work needs are identified in the near future.   ?Follow-Up Date:  11/28/2021 at 10:45 am  ? ?Please call the care guide team at [phone] if you need to cancel or reschedule your appointment.  ? ?If you are experiencing a Mental Health or Behavioral Health Crisis or need someone to talk to, please call the Suicide and Crisis Lifeline: 988 ?call the [place] National Suicide Prevention Lifeline: 215-384-55541-519-051-6960 or TTY: 216-336-10061-800-799-4 TTY 9026209833(1-425-502-6549) to talk to a trained counselor ?call 1-800-273-TALK (toll free, 24 hour hotline) ?go to [facility] [address], [place]  [phone]) ?call the [facility] Crisis Line: [phone] ?call 911  ? ?Patient verbalizes understanding of instructions and care plan provided today and agrees to view in MyChart. Active MyChart status confirmed with patient.   ? ?Danford BadJoanna Neyra Pettie LCSW ?Licensed Clinical Social Worker ?[facility] ?[phone]  ?

## 2021-11-15 ENCOUNTER — Ambulatory Visit: Payer: 59 | Admitting: Pharmacist

## 2021-11-15 DIAGNOSIS — E1169 Type 2 diabetes mellitus with other specified complication: Secondary | ICD-10-CM

## 2021-11-15 DIAGNOSIS — E669 Obesity, unspecified: Secondary | ICD-10-CM

## 2021-11-15 DIAGNOSIS — Z79899 Other long term (current) drug therapy: Secondary | ICD-10-CM

## 2021-11-15 DIAGNOSIS — I10 Essential (primary) hypertension: Secondary | ICD-10-CM

## 2021-11-15 DIAGNOSIS — J4540 Moderate persistent asthma, uncomplicated: Secondary | ICD-10-CM

## 2021-11-15 DIAGNOSIS — M797 Fibromyalgia: Secondary | ICD-10-CM

## 2021-11-15 NOTE — Chronic Care Management (AMB) (Signed)
Chronic Care Management Pharmacy Note  11/15/2021 Name:  [name] MRN:  [id number] DOB:  12-01-1960  Summary: Patient has a high deductible [employer] medical plan - [employer]. Cost of insulin and maintenance inhalers are expensive. Reminded patient to complete and return assistance program application for Humulin U500. [facility]ked [facility] to retry inhaler as brand Advair and was $0 (interestingly generic was $110)     Subjective: [name] is an 61 y.o. year old female who is a primary patient of Shelda Pal, DO.  The CCM team was consulted for assistance with disease management and care coordination needs.    Engaged with patient by telephone for follow up visit in response to provider referral for pharmacy case management and/or care coordination services.   Consent to Services:  The patient was given information about Chronic Care Management services, agreed to services, and gave verbal consent prior to initiation of services.  Please see initial visit note for detailed documentation.   Patient Care Team: Shelda Pal, DO as PCP - General (Family Medicine) Sueanne Margarita, MD as PCP - Cardiology (Cardiology) Luretha Rued, RN as Case Manager Cherre Robins, RPH-CPP (Pharmacist) [place], Maree Erie, LCSW as Social Worker (Licensed Clinical Social Worker)  Recent office visits: 11/14/2021 - [facility] - phone call / My Chart message. Refill mupirocin ointment. recommended Video Visit 10/10/2021 - PCP (Dr Nani Ravens) Seen for cellulitis. Prescribed Sulfamethoxazole-Trimethoprim 800-160 MG 1 tablet Oral 2 times daily for 10 days. Patient has already started cephalexin 592m 3 times a day at home. 07/05/2021 - [place] - started omeperazole; Changed pramipexole to 144m3 times a day and 0.18m26mwice a day if needed for breakthrough restless leg syndrome. stopped esomeprazole 4m33mRecent consult visits: 10/24/2021 - Physical Med and Rehab (ThoMarcello Moores)F/U  pain managment. Pain in neck, shoulders, lower back and right stump. No med changes. continue current pharmacotherapy for pain management.  10/12/2021 - Ortho Surgery (ZamCoralyn Pear) F/U AKA of right leg. Changed diphenhydramine to hydroxyzine 10mg20mimes a day as needed. F/U 2 weeks.  08/25/2021 - Ortho Surgery (Dr Duda)Sharol Given Right AKA 07/22/2021. Continue with protein supplements. F/U in 4 weeks.   08/18/2021 - Pain Mangement (ThomMarcello Moores Follow above the knee amputation. decreased dose of oxycodone to 20mg 72my 8 hours as needed.  08/03/2021 - Pain Management - phone call. Lidocaine 5% patches denied by insurance.  07/19/2021 - [facility]ophronia [facility] - added doxycycline 100mg [id number] 30 days 07/15/2021 - Pain Management - increased oxycodone 20mg ev33m8 hours as needed; increased pregabalin 218mg twi18m day  Hospital visits: 07/22/2021 Hospitalization Stop tramadol; Changed Humulin U500 inject 40 to 150 units 3 times a day with meals.  Objective:  Lab Results  Component Value Date   CREATININE 0.88 07/27/2021   CREATININE 1.28 (H) 07/24/2021   CREATININE 0.81 07/23/2021    Lab Results  Component Value Date   HGBA1C 7.3 (H) 05/27/2021   Last diabetic Eye exam: No results found for: HMDIABEYEEXA  Last diabetic Foot exam: No results found for: HMDIABFOOTEX      Component Value Date/Time   CHOL 194 10/27/2020 0809   CHOL 176 05/04/2020 0947   TRIG 223.0 (H) 10/27/2020 0809   HDL 46.60 10/27/2020 0809   HDL 41 05/04/2020 0947   CHOLHDL 4 10/27/2020 0809   VLDL 44.6 (H) 10/27/2020 0809   LDLCALC 100 (H) 05/04/2020 0947   LDLDIRECT 121.0 10/27/2020 0809    Hepatic  Function Latest Ref Rng & Units 06/09/2021 05/28/2021 05/26/2021  Total Protein 6.5 - 8.1 g/dL 6.4(L) 6.1(L) 7.8  Albumin 3.5 - 5.0 g/dL 2.5(L) 2.9(L) 3.9  AST 15 - 41 U/L 51(H) 76(H) 42(H)  ALT 0 - 44 U/L 39 36 33  Alk Phosphatase 38 - 126 U/L 63 37(L) 48  Total Bilirubin 0.3 - 1.2 mg/dL 0.6 1.1 0.7  Bilirubin, Direct 0.0 -  0.2 mg/dL - 0.1 -    Lab Results  Component Value Date/Time   TSH 2.170 05/04/2020 09:47 AM   TSH 0.624 10/20/2018 02:50 AM   TSH 1.388 01/02/2018 06:09 PM   TSH 2.451  Test methodology is 3rd generation TSH  12/06/2008 04:55 AM    CBC Latest Ref Rng & Units 07/24/2021 07/23/2021 07/05/2021  WBC 4.0 - 10.5 K/uL 13.5(H) 10.8(H) 8.8  Hemoglobin 12.0 - 15.0 g/dL 7.6(L) 7.9(L) 9.0(L)  Hematocrit 36.0 - 46.0 % 26.8(L) 27.5(L) 29.4(L)  Platelets 150 - 400 K/uL 330 378 264.0    Lab Results  Component Value Date/Time   VD25OH 69.04 07/25/2017 11:55 AM    Clinical ASCVD: Yes  The 10-year ASCVD risk score (Arnett DK, et al., 2019) is: 6.5%   Values used to calculate the score:     Age: 76 years     Sex: Female     Is Non-Hispanic African American: No     Diabetic: Yes     Tobacco smoker: No     Systolic Blood Pressure: 270 mmHg     Is BP treated: Yes     HDL Cholesterol: 46.6 mg/dL     Total Cholesterol: 194 mg/dL     Social History   Tobacco Use  Smoking Status Former   Packs/day: 1.00   Years: 19.00   Pack years: 19.00   Types: Cigarettes   Start date: 39   Quit date: 02/11/2004   Years since quitting: 17.7   Passive exposure: Past  Smokeless Tobacco Never   BP Readings from Last 3 Encounters:  10/24/21 107/62  10/10/21 118/72  09/27/21 133/73   Pulse Readings from Last 3 Encounters:  10/24/21 93  10/10/21 (!) 102  09/27/21 (!) 101   Wt Readings from Last 3 Encounters:  09/27/21 291 lb (132 kg)  08/18/21 291 lb (132 kg)  07/22/21 291 lb (132 kg)    Assessment: Review of patient past medical history, allergies, medications, health status, including review of consultants reports, laboratory and other test data, was performed as part of comprehensive evaluation and provision of chronic care management services.   SDOH:  (Social Determinants of Health) assessments and interventions performed:    CCM Care Plan  Allergies  Allergen Reactions   Fish  Allergy Anaphylaxis    INCLUDES OMEGA 3 OILS   Fish Oil Anaphylaxis and Swelling    THROAT SWELLS INCLUDES FISH AS A CLASS   Omega-3 Fatty Acids Swelling    Throat swelling  Has tolerated Glucerna nutritional drinks    Crestor [Rosuvastatin] Other (See Comments)    Extreme joint pain, to this & other statins   Gabapentin Anxiety    Anxiety on high doses   Lovastatin Other (See Comments)    EXTREME JOINT PAIN   Metronidazole Nausea Only and Swelling    Headache and shakes Nausea and vomiting   Red Yeast Rice [Cholestin] Other (See Comments)    Muscle pain and severe joint pain.    Rotigotine Swelling    (Neupro) Extreme edema    Atrovent Hfa [Ipratropium  Bromide Hfa] Other (See Comments)    wheezing   Iodine Swelling   Other Other (See Comments)    Surgical Staples causes redness/infection per patient.   Pepto-Bismol [Bismuth Subsalicylate] Nausea And Vomiting    Extreme vomiting   Victoza [Liraglutide]     Unsure of reaction    Enalapril Cough   Suprep [Na Sulfate-K Sulfate-Mg Sulf] Nausea And Vomiting   Tape Other (See Comments)    Electrodes causes skin breakdown    Medications Reviewed Today     Reviewed by Cherre Robins, RPH-CPP (Pharmacist) on 11/15/21 at [place] List Status: <None>   Medication Order Taking? Sig Documenting Provider Last Dose Status Informant  albuterol (PROAIR HFA) 108 (90 Base) MCG/ACT inhaler [id number] Yes Inhale 2 puffs into the lungs every 4 (four) hours as needed for wheezing or shortness of breath. Shelda Pal, DO Taking Active   albuterol (PROVENTIL) (2.5 MG/3ML) 0.083% nebulizer solution [id number] Yes USE 1 VIAL IN NEBULIZER EVERY 4 HOURS AS NEEDED FOR WHEEZING FOR SHORTNESS OF BREATH Shelda Pal, DO Taking Active   ascorbic acid (VITAMIN C) 500 MG tablet [id number] Yes Take 1 tablet (500 mg total) by mouth 2 (two) times daily. Bary Leriche, PA-C Taking Active Self  aspirin EC 81 MG tablet [id number] Yes Take 1  tablet (81 mg total) by mouth every evening. Shelda Pal, DO Taking Active   betamethasone dipropionate 0.05 % cream [id number] No Apply 1 application topically 2 (two) times daily as needed (extreme itching).  Patient not taking: Reported on 11/15/2021   [provider] Not Taking Active Self  collagenase (SANTYL) ointment [id number] No Apply 1 application topically daily.  Patient not taking: Reported on 11/15/2021   Bary Leriche, PA-C Not Taking Active Self  cyclobenzaprine (FLEXERIL) 10 MG tablet [id number] Yes Take 1 tablet (10 mg total) by mouth 3 (three) times daily as needed for muscle spasms. Bary Leriche, PA-C Taking Active Self           Med Note Antony Contras, Lynelle Smoke B   Tue Aug 23, 2021 10:29 AM) Uses tizanidine OR cyclobenzaprine  diclofenac Sodium (VOLTAREN) 1 % GEL [id number] Yes Apply 2 g topically 4 (four) times daily. Bary Leriche, PA-C Taking Active Self           Med Note Luretha Rued   Tue Aug 09, 2021  2:24 PM) Reports using as needed.  DILT-XR 240 MG 24 hr capsule [id number] Yes Take 240 mg by mouth daily. [provider] Taking Active   EPINEPHrine (EPIPEN 2-PAK) 0.3 mg/0.3 mL IJ SOAJ injection [id number] Yes USE AS DIRECTED FOR SEVERE ALLERGIC REACTION. Shelda Pal, DO Taking Active   ezetimibe (ZETIA) 10 MG tablet [id number] Yes Take 1 tablet (10 mg total) by mouth daily. Shelda Pal, DO Taking Active   fluconazole (DIFLUCAN) 150 MG tablet [id number] Yes Take 1 tablet (150 mg total) by mouth every 3 (three) days. Shelda Pal, DO Taking Active   fluticasone [facility]) 50 MCG/ACT nasal spray [id number] Yes USE 2 SPRAY(S) IN EACH NOSTRIL ONCE DAILY AS NEEDED FOR  ALLERGIES  OR  RHINITIS Shelda Pal, DO Taking Active   fluticasone-salmeterol (ADVAIR) 250-50 MCG/ACT AEPB [id number] No Inhale 1 puff into the lungs in the morning and at bedtime.  Patient not taking: Reported on 11/15/2021   Shelda Pal, DO Not Taking Active   folic acid (FOLVITE) 1 MG tablet [id number] Yes Take 1,000 mcg by mouth daily  at 12 noon. [provider] Taking Active Self  furosemide (LASIX) 20 MG tablet [id number] Yes Take 20-40 mg by mouth 3 (three) times daily as needed (fluid retention/swelling). [provider] Taking Active Self           Med Note Juleen China, Deno Etienne   Tue Aug 09, 2021  2:29 PM) Reports takes 1-2 times a day as needed depending on weight > 5 pounds in a day.  gabapentin (NEURONTIN) 400 MG capsule [id number] Yes Take 1 capsule by mouth in the morning, 1 capsule at noon, and 2 capsules at bed time. Shelda Pal, DO Taking Active   GLUCOMANNAN PO [id number] Yes Take 1,995 mg by mouth in the morning, at noon, and at bedtime. [provider] Taking Active Self  glucose blood test strip [id number] Yes 1 each by Other route 4 (four) times daily. Contour Next strips Flora Lipps Taking Active Self  HUMULIN R 500 UNIT/ML injection [id number] Yes INJECT 0.08-0.3 MLS (40-150 UNITS TOTAL) INTO THE SKIN THREE TIMES DAILY WITH MEALS Shelda Pal, DO Taking Active   hydrocortisone cream 1 % [id number] Yes Apply 1 application topically 3 (three) times daily as needed for itching (minor skin irritation). Bary Leriche, PA-C Taking Active Self  hydroxychloroquine (PLAQUENIL) 200 MG tablet [id number] Yes Take 1 tablet (200 mg total) by mouth 2 (two) times daily. Shelda Pal, DO Taking Active Self  hydrOXYzine (ATARAX) 10 MG tablet [id number] Yes Take 1 tablet (10 mg total) by mouth 3 (three) times daily as needed. Suzan Slick, NP Taking Active   INSULIN SYRINGE 1CC/29G (B-D INSULIN SYRINGE) 29G X 1/2" 1 ML MISC [id number] Yes Use three times daily with insulin.  DX E11.9 Shelda Pal, DO Taking Active Self  isosorbide mononitrate (IMDUR) 30 MG 24 hr tablet [id number] Yes Take 1 tablet (30 mg total) by mouth daily.  Patient taking differently:  Take 30 mg by mouth at bedtime.   Sueanne Margarita, MD Taking Active   levocetirizine (XYZAL) 5 MG tablet [id number] Yes TAKE 1 TABLET BY MOUTH ONCE DAILY IN THE EVENING [place], Crosby Oyster, DO Taking Active   levothyroxine (SYNTHROID) 200 MCG tablet [id number] Yes TAKE 1 TABLET BY MOUTH ONCE DAILY BEFORE BREAKFAST Shelda Pal, DO Taking Active   metFORMIN (GLUCOPHAGE-XR) 500 MG 24 hr tablet [id number] Yes Take 2 tablets by mouth twice daily Shelda Pal, DO Taking Active   mupirocin ointment (BACTROBAN) 2 % [id number] Yes Apply 1 application topically 2 (two) times daily as needed (wound care). Shelda Pal, DO Taking Active   naloxone [facility]) nasal spray 4 mg/0.1 mL [id number] Yes Place 1 spray into the nose as needed (opoid overdose). [provider] Taking Active Self  nitroGLYCERIN (NITROSTAT) 0.4 MG SL tablet [id number] Yes DISSOLVE ONE TABLET UNDER THE TONGUE EVERY 5 MINUTES AS NEEDED FOR CHEST PAIN.  DO NOT EXCEED A TOTAL OF 3 DOSES IN 15 MINUTES Turner, Eber Hong, MD Taking Active   omeprazole (PRILOSEC) 40 MG capsule [id number] Yes Take 1 capsule (40 mg total) by mouth daily. Shelda Pal, DO Taking Active Self  Oxycodone HCl 20 MG TABS [id number] Yes Take 1 tablet (20 mg total) by mouth every 8 (eight) hours as needed. Bayard Hugger, NP Taking Active            Med Note Franchot Gallo   Mon Oct 24, 2021 12:20 PM) LAST TAKEN TODAY ON HAND #7 LAST FILLED ON 09/27/2021  FOR 90  oxymetazoline (AFRIN) 0.05 % nasal spray [id number] Yes Place 2 sprays into both nostrils 2 (two) times daily as needed. [provider] Taking Active Self  Polyethyl Glycol-Propyl Glycol (SYSTANE) 0.4-0.3 % SOLN [id number] Yes Place 1-2 drops into both eyes in the morning. [provider] Taking Active Self  POT [place], 25 MEQ TBEF [id number] Yes Take 25 mEq by mouth 3 (three) times daily as needed (with lasix). [provider] Taking Active Self  pramipexole (MIRAPEX) 0.5 MG tablet [id number] Yes Take 0.5 mg by mouth 2 (two) times daily. [provider] Taking Active   pramipexole (MIRAPEX) 1 MG tablet [id number] Yes Take 1 tablet (1 mg total) by mouth See admin instructions. Take 1 tablet (1 mg) by mouth scheduled 3 times daily & take 0.5 tablet (0.5 mg) by mouth twice daily if needed for breakthrough restless leg Shelda Pal, DO Taking Active Self  pregabalin (LYRICA) 25 MG capsule [id number] Yes Take 1 capsule (25 mg total) by mouth 2 (two) times daily. Bayard Hugger, NP Taking Active   promethazine (PHENERGAN) 25 MG tablet [id number] Yes Take 1 tablet (25 mg total) by mouth every 8 (eight) hours as needed for vomiting or nausea. Shelda Pal, DO Taking Active Self  saccharomyces boulardii (FLORASTOR) 250 MG capsule [id number] Yes Take 250 mg by mouth 3 (three) times daily. [provider] Taking Active Self           Med Note Antony Contras, Lynelle Smoke B   Tue Aug 23, 2021 11:06 AM) Uses when on antibiotic  tiZANidine (ZANAFLEX) 2 MG tablet [id number] Yes Take 1 tablet (2 mg total) by mouth 3 (three) times daily. Shelda Pal, DO Taking Active   valsartan (DIOVAN) 160 MG tablet [id number] Yes Take 1 tablet (160 mg total) by mouth daily. Sueanne Margarita, MD Taking Active   vitamin B-12 (CYANOCOBALAMIN) 1000 MCG tablet [id number] Yes Take 1,000 mcg by mouth in the morning. [provider] Taking Active Self  Vitamin D, Ergocalciferol, (DRISDOL) 1.25 MG (50000 UNIT) CAPS capsule [id number] Yes TAKE 1 CAPSULE BY MOUTH ONCE A WEEK (EVERY  7  DAYS)  TAKE  ON  FRIDAY Shelda Pal, DO Taking Active   Zinc Sulfate 220 (50 Zn) MG TABS [id number] Yes Take 1 tablet (220 mg total) by mouth daily.  Patient taking differently: Take 220 mg by mouth at bedtime.   Bary Leriche, [place] Taking Active             Patient Active Problem List   Diagnosis Date Noted   Peripheral  vascular disease, unspecified ([place]) 10/10/2021   Unilateral AKA, right ([place]) 10/10/2021   Hx of AKA (above knee amputation), right ([place]) 07/22/2021   Hx of BKA, right ([place]) 06/29/2021   Dehiscence of amputation stump (HCC)    Acute blood loss anemia 06/24/2021   Postoperative wound infection 06/24/2021   UTI (urinary tract infection) 06/24/2021   Superficial dehiscence of operation wound 06/24/2021   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 ([place])    Bacteremia due to Enterococcus 05/28/2021   Sepsis ([place]) 05/26/2021   Digestive disorder 05/18/2021   Bilateral lower extremity edema 05/02/2021   Statin myopathy 03/04/2021   Aortic stenosis 03/04/2021   Sjogren's syndrome ([place]) 02/11/2020   Cutaneous abscess of right foot    Statin intolerance 08/16/2019   Gram-negative bacteremia 10/18/2018   Streptococcal bacteremia 10/18/2018   CAD S/P percutaneous coronary angioplasty 04/19/2018   Essential  hypertension    Moderate persistent asthma 03/21/2018   NAFLD (nonalcoholic fatty liver disease)    Recurrent cellulitis of lower extremity 01/02/2018   Cellulitis of lower leg 01/02/2018   Chronic diarrhea 05/08/2017   H/O Clostridium difficile infection 05/08/2017   Rectal bleeding 05/08/2017   Hyperbilirubinemia 04/29/2016   Hyponatremia 04/29/2016   Cellulitis of right foot 03/09/2016   Mild persistent asthma 02/15/2016   Allergic rhinitis due to pollen 02/15/2016   Anaphylactic reaction due to food 02/10/2016   Diabetes mellitus type 2 in obese ([place]) 02/10/2016   Gastroesophageal reflux disease without esophagitis 02/10/2016   Atopic eczema 02/10/2016   Cervical nerve root disorder 12/15/2015   Lumbar radiculopathy 12/15/2015   Daytime somnolence 11/26/2015   Venous stasis dermatitis of both lower extremities 02/11/2015   Morbid obesity ([place]) 06/19/2014   Abnormal LFTs 03/26/2014   B-complex deficiency 03/26/2014   Benign essential HTN 03/26/2014    Chronic pain associated with significant psychosocial dysfunction 03/26/2014   Diaphragmatic hernia 03/26/2014   Gastroesophageal reflux disease 03/26/2014   Hammer toe 03/26/2014   H/O neoplasm 03/26/2014   Adaptive colitis 03/26/2014   Diabetic polyneuropathy ([place]) 03/26/2014   Deafness, sensorineural 03/26/2014   Fibromyalgia 01/20/2014   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/20/2014   Hyperlipidemia 11/11/2012   Mixed connective tissue disease ([place]) 11/11/2012   Hypothyroidism 49/67/5916   Uncomplicated asthma 38/46/6599   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[place]) 02/20/2012   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   ANA positive 07/17/2011    Immunization History  Administered Date(s) Administered   Influenza,inj,Quad PF,6+ Mos 06/30/2015   Pneumococcal Polysaccharide-23 02/25/2014    Conditions to be addressed/monitored: CAD, HTN, HLD, DMII, Asthma, Hypothyroidism, and mixed connective tissue disease, chronic pain,   Care Plan : [place]  Updates made by Cherre Robins, RPH-CPP since 11/15/2021 12:00 AM     Problem: Diabetes; neuropathy; HDL; HTN; CAD;  chronic pain      Long-Range Goal: Provide education, support and care coordination for medication therapy and chronic conditions   Start Date: 08/23/2021  Priority: High  Note:   Current Barriers:  Unable to independently afford treatment regimen Unable to achieve control of hyperlipidemia and type 2 DM  Does not adhere to prescribed medication regimen  Pharmacist Clinical Goal(s):  Over the next 90 days, patient will verbalize ability to afford treatment regimen achieve control of type 2 DM and hyperlipidemia as evidenced by A1c < 7.0% and LDL <70 adhere to prescribed medication regimen as evidenced by refill history and attainment of treatment goals  through collaboration with PharmD and provider.   Interventions: 1:1 collaboration  with Shelda Pal, DO regarding development and update of comprehensive plan of care as evidenced by provider attestation and co-signature Inter-disciplinary care team collaboration (see longitudinal plan of care) Comprehensive medication review performed; medication list updated in electronic medical record  Diabetes: Uncontrolled; A1c improving; A1c goal < 7.0% Current treatment: Humulin R U-500 - inject 3 to 40 units 3 times a day Metformin ER 525m - take 2 tablets twice a day Previously has taken: Novolog and Semglee / glargine insulin Patient states insulin can sometimes cost over $300. Patient has high deductible health plan. [facility]. Denies hypoglycemic/hyperglycemic symptoms Current exercise: none at this time Interventions:  [facility], she has not completed. Today  she reports she has about 5 months of insulin. Reminded patient to complete Application (may take 4 to 6 weeks for application to be processed and receive medication) Cost of Humulin U500 with her new [employer] plan is $150 Reviewed hypoglycemia treatment.  Reviewed home blood glucose readings and reviewed goals  Fasting blood glucose goal (before meals) = 80 to 130 Blood glucose goal after a meal = less than 180   Hyperlipidemia / CAD: Uncontrolled; LDL goal <70 Adherence improving since our last visit. Current treatment: Ezetimibe 60m daily (last filled 09/21/2021 for 90 days) Aspirin 831mdaily  Isosorbide ER 3061maily  Noted to be statin intolerant - Medications previously tried: rosuvastatin (extreme joint pain), lovastatin (joint pain), Red Yeast Rice (muscle and joint pain), Fish Oil (anaphylaxis and swelling); Nexlizet (stopped due to cost)  Interventions:  Ezetimibe, aspirin and isosorbide are all tier 1 (preventative) with [employer]  Plan - should be $0 Recommend recheck Lipids in 4 to 6 weeks.  Discussed LDL goal  Hypertension:  Controlled; Last 2 of 3 office blood pressure readings have been at goal Blood pressure goal <140/90 Current therapy:  Valsartan 160m20mily  Diltiazem 240mg50mly  Interventions:  Discussed blood pressure goals Reviewed formulary - valsartan is tier 1 (preventative and will be $0); Diltiazem is tier 2 (unable to determine exact cost) Continue current therapy for hypertension   Asthma: Uncontrolled; Goal: decrease number of days per month rescue inhaler is needed.  Current treatment: Albuterol nebulization or inhaler - use as needed up to every 4 hours as needed for wheezing and shortness of breath Fluticasone / salmeterol 250/50mcg52mnhaler 1 puff twice a day (not taking due to cost)  Rescue inhaler use: uses very frequently; on a daily basis Maintenance inhaler: not currently prescribed. Per patient she has tried in past but maintenance inhalers have not been effective though she does also admit that cost has been an issue with adherence.  Past inhalers tried:  Xopenex (ineffective), Symbicort (ineffective/low adherence), Advair (ineffective/low adherence) Interventions:  Discussed benefits of maintenance inhalers in controlling asthma and preventing exacerbations.  Mailed application for Symbicort patient assistance program at last visit.  Reminded to complete Reviewed 2023 benefits with [employer]. Looks like Advair Diskus or HFA are tier 1. Sent in Rx at last visit and cost was $110 but pharmacy filled for generic Advair. Coordinated with pharmacy today and they retried with brand Adviar. Brand was covered $0!!  Medication management Pharmacist Clinical Goal(s): Over the next 90 days, patient will work with PharmD and providers to maintain optimal medication adherence [facility]. Most medications on her list are tier 1 / or tier  1 preventative. Tier 1 preventative will be $0.  Tier 1 meds include: albuterol inhaler, cyclobenzaprine, ergocalciferol, ezetimibe; fluconazole, Advair, folic acid, furosemide, gabapentin, hydroxyzine, isosorbide mononitrate, levocetirizine, aspirin, levothyroxine, metformin, mupirocin, NTG, omeprazole, pramipexole, pregabalin, promethazine, valsartan Tier 2 meds include: betamethasone, diltiazem, epinephrine, hydroxychloroquine, naloxone, oxycodone,  Tier 4 med: santyl, Humulin u500 Non formulary: potassium bicarbonate  Patient asked about cost of refill for naloxone nasal spray - would be $90 per pharmacy  Interventions Comprehensive medication review performed. Reviewed refill history and assessed adherence Continue current medication management strategy Coordinated refill for nitroglycerin - tier 1 preventative $0 Message sent to PCP regarding refill for folic acid.   Patient Goals/[name] Activities Over the next 90 days, patient will:  take medications as prescribed,  check glucose 3 to 4 times per  day, document, and provide at future appointments,  collaborate with provider on medication access solutions, and  complete patient assistance applications and return to [place] (Maeve Debord, clinical pharmacist)  Follow Up Plan: Telephone follow up appointment with care management team member scheduled for:  4 weeks.          Medication Assistance: Application for Humulin R and possible Symbicort (she will retry if approved for patient assistance program)   medication assistance program. in process.  Anticipated assistance start date 12/24/2021.  See plan of care for additional detail.  Patient's preferred pharmacy is:  [facility], [place] - [address] [place] [address] Phone: [phone] Fax: [phone]   Follow Up:  Patient agrees to Care Plan and Follow-up.  Plan: Telephone follow up appointment with  care management team member scheduled for:  1 month  Cherre Robins, PharmD Clinical Pharmacist [facility]

## 2021-11-15 NOTE — Patient Instructions (Signed)
[name] ?It was a pleasure speaking with you today.  I have great news - I asked [employer] to retry your inhaler for brand name Advair and it was covered at not cost $0!! ?Please let me know if you have any questions after you pick up. ? ?I have attached a summary of our visit today and information about your health goals. (See below) ? ?Our next appointment is by telephone on December 27, 2021 at 2pm ?Please call the care guide team at [phone] if you need to cancel or reschedule your appointment.  ? ?If you have any questions or concerns, please feel free to contact me either at the phone number below or with a MyChart message.  ? ?Keep up the good work! ? ?[name], PharmD ?Clinical Pharmacist ?[facility] ?[facility] ?[phone] (direct line)  ?[phone] (main office number) ? ? ?Health and Medication related Goals:  ? ?Diabetes: ?Uncontrolled; A1c improving; A1c goal < 7.0% ?Lab Results  ?Component Value Date  ? HGBA1C 7.3 (H) 05/27/2021  ? ?Current treatment: ?Humulin R U-500 - inject 3 to 40 units 3 times a day ?Metformine Er 500mg  - take 2 tablets twice a day ?Interventions:  ?Complete and return application for patient assistance program for [employer] for Humulin insulin to [name], clinical pharmacist at [facility] ?Check blood glucose 3 to 4 times a day. Record for review at future appointments.  ?Reviewed home blood glucose readings and reviewed goals  ?Fasting blood glucose goal (before meals) = 80 to 130 ?Blood glucose goal after a meal = less than 180  ? ?Hyperlipidemia / Heart Disease: ?Uncontrolled; LDL goal <70 ?Lab Results  ?Component Value Date  ? CHOL 194 10/27/2020  ? CHOL 176 05/04/2020  ? CHOL 217 (H) 04/08/2018  ? ?Lab Results  ?Component Value Date  ? HDL 46.60 10/27/2020  ? HDL 41 05/04/2020  ? HDL 48 04/08/2018  ? ?Lab Results  ?Component Value Date  ? TRIG 223.0 (H) 10/27/2020  ? TRIG 204 (H) 05/04/2020  ? TRIG 116 04/08/2018  ? ?Lab  Results  ?Component Value Date  ? LDLDIRECT 121.0 10/27/2020  ? ? ?Current treatment: ?Ezetimibe 10mg  daily  ?Aspirin 81mg  daily  ?Isosorbide ER 30mg  daily  ?Interventions:  ?Recommend recheck Lipids with next labs.  ?Discussed LDL goal ?Continue current therapy ? ?Hypertension:  ?Controlled; blood pressure goal 130/80 ?BP Readings from Last 3 Encounters:  ?10/24/21 107/62  ?10/10/21 118/72  ?09/27/21 133/73  ? ?Blood pressure goal <140/90 ?Current therapy:  ?Valsartan 160mg  daily  ?Diltiazem 240mg  daily  ?Interventions:  ?Discussed blood pressure goals ?Continue current therapy for hypertension  ? ?Asthma: ?Uncontrolled; Goal: decrease number of days per month rescue inhaler is needed.  ?Current treatment: ?Albuterol nebulization or inhaler - use as needed up to every 4 hours as needed for wheezing and shortness of breath ?Advair - Fluticasone / salmerterol 250/8750mcg - inhaler 1 puff into lungs twice a day (not taking due to cost) ?Interventions:  ?Discussed benefits of maintenance inhalers in controlling asthma and preventing exacerbations.  ?Reassess 2023 medication benefits and branded Advair if tier 1 - has pharmacy resubmit claim with brand Adviar and copay was $0 ? ?Medication management ?Pharmacist Clinical Goal(s): ?Over the next 90 days, patient will work with PharmD and providers to maintain optimal medication adherence ?Current pharmacy: [facility] ?Interventions ?Comprehensive medication review performed. ?Reviewed refill history and assessed adherence ?Continue current medication management strategy ? ?Patient Goals/[name] Activities ?Over the next 90  days, patient will:  ?take medications as prescribed,  ?check glucose 3 to 4 times per day, document, and provide at future appointments,  ?collaborate with provider on medication access solutions, and complete patient assistance applications and return to [facility] ([name], clinical pharmacist) ?Continue ezetimibe 10mg  daily   ? ?Follow Up Plan: Telephone follow up appointment with care management team member scheduled for:  1 month ? ?Patient verbalizes understanding of instructions and care plan provided today and agrees to view in MyChart. Active MyChart status confirmed with patient.    ?

## 2021-11-16 ENCOUNTER — Encounter: Payer: Self-pay | Admitting: Family Medicine

## 2021-11-16 ENCOUNTER — Telehealth (INDEPENDENT_AMBULATORY_CARE_PROVIDER_SITE_OTHER): Payer: 59 | Admitting: Family Medicine

## 2021-11-16 VITALS — Temp 100.0°F

## 2021-11-16 DIAGNOSIS — R300 Dysuria: Secondary | ICD-10-CM

## 2021-11-16 DIAGNOSIS — L309 Dermatitis, unspecified: Secondary | ICD-10-CM

## 2021-11-16 MED ORDER — BETAMETHASONE DIPROPIONATE AUG 0.05 % EX CREA: TOPICAL_CREAM | Freq: Two times a day (BID) | CUTANEOUS | 2 refills | Status: DC

## 2021-11-16 MED ORDER — NITROFURANTOIN MONOHYD MACRO 100 MG PO CAPS: 100.0000 mg | ORAL_CAPSULE | Freq: Two times a day (BID) | ORAL | 0 refills | Status: AC

## 2021-11-16 NOTE — Progress Notes (Signed)
Chief Complaint  ?Patient presents with  ? Follow-up  ?  Medication ?Blood sugar this morning was 227 ?Past couple of days has been elevated.  ? Allergies  ? ? ?[name] is a 61 y.o. female here for possible UTI. Due to COVID-19 pandemic, we are interacting via web portal for an electronic face-to-face visit. I verified patient's ID using 2 identifiers. Patient agreed to proceed with visit via this method. Patient is at home, I am at office. Patient and I are present for visit.  ? ?Duration: 1 week. ?Symptoms: Dysuria, urinary frequency, urinary hesitancy, urinary retention, fever, nausea, and urgency ?Denies: hematuria, flank pain on bilateral, and vomiting, vaginal discharge ?Hx of recurrent UTI? No ?Denies new sexual partners. ? ?Hx of eczema, flared up 2 weeks ago.  She thinks it is related to a tree near her house.  She has been dealing with allergy symptoms as well.  No swelling in her mouth/throat and no shortness of breath.  She has been using over-the-counter cortisone cream with minimal relief.  She is also been using CeraVe cream. ? ?Past Medical History:  ?Diagnosis Date  ? Aortic stenosis   ? mild AS by echo 02/2021  ? Asthma   ? "on daily RX and rescue inhaler" (04/18/2018)  ? Chronic diastolic CHF (congestive heart failure) (HCC) 09/2015  ? Chronic lower back pain   ? Chronic neck pain   ? Chronic pain syndrome   ? Fentanyl Patch  ? Colon polyps   ? Coronary artery disease   ? cath with normal LM, 30% LAD, 85% mid RCA and 95% distal RCA s/p PCI of the mid to distal RCA and now on DAPT with ASA and Ticagrelor.    ? Eczema   ? Excessive daytime sleepiness 11/26/2015  ? Fibromyalgia   ? Gallstones   ? GERD (gastroesophageal reflux disease)   ? Heart murmur   ? "noted for the 1st time on 04/18/2018"  ? History of blood transfusion 07/2010  ? "S/P oophorectomy"  ? History of gout   ? History of hiatal hernia 1980s  ? "gone now" (04/18/2018)  ? Hyperlipidemia   ? Hypertension   ? takes Metoprolol and  Enalapril daily  ? Hypothyroidism   ? takes Synthroid daily  ? IBS (irritable bowel syndrome)   ? Migraine   ? "nothing in the 2000s" (04/18/2018)  ? Mixed connective tissue disease (HCC)   ? NAFLD (nonalcoholic fatty liver disease)   ? Pneumonia   ? "several times" (04/18/2018)  ? PVC's (premature ventricular contractions)   ? noted on event monitor 02/2021  ? Rheumatoid arthritis (HCC)   ? "hands, elbows, shoulders, probably knees" (04/18/2018)  ? Scoliosis   ? Sleep apnea   ? mild - does not use cpap  ? Spondylosis   ? Type II diabetes mellitus (HCC)   ? takes Metformin and Hum R daily (04/18/2018)  ? Walker as ambulation aid   ? also uses wheelchair  ?  ? ?Temp 100 ?F (37.8 ?C) (Oral)  ?No conversational dyspnea ?Age appropriate judgment and insight ?Nml affect and mood ? ?Dysuria - Plan: nitrofurantoin, macrocrystal-monohydrate, (MACROBID) 100 MG capsule ? ?Eczema, unspecified type ? ?New, self-resolving.  5 days of Macrobid 100 mg twice daily.  Stay hydrated. Seek immediate care if pt starts to develop fevers, new/worsening symptoms, uncontrollable N/V. ?Exacerbation of chronic issue.  Diprolene twice daily for the next 7 to 10 days.  Avoid scented products.  Continue nonscented emollient.  Try  not to scratch. ?Follow-up as originally scheduled. ?The patient voiced understanding and agreement to the plan. ? ?Shelda Pal, DO ?11/16/21 ?3:25 PM ? ?

## 2021-11-18 ENCOUNTER — Other Ambulatory Visit: Payer: Self-pay | Admitting: Family

## 2021-11-18 ENCOUNTER — Telehealth: Payer: Self-pay

## 2021-11-18 ENCOUNTER — Other Ambulatory Visit: Payer: Self-pay | Admitting: Registered Nurse

## 2021-11-18 ENCOUNTER — Other Ambulatory Visit: Payer: Self-pay | Admitting: Family Medicine

## 2021-11-18 DIAGNOSIS — J45909 Unspecified asthma, uncomplicated: Secondary | ICD-10-CM

## 2021-11-18 NOTE — Telephone Encounter (Signed)
?  Care Management  ? ?Follow Up Note ? ? ?11/18/2021 ?Name: STEFANI GRITTER MRN: [id number] DOB: 1961-02-28 ? ? ?Referred by: Sharlene Dory, DO ?Reason for referral : No chief complaint on file. ? ? ?RNCM called to complete follow up telephone assessment. Spoke with Mrs. Frison who request to reschedule. She reports she is feeling a little nauseous from the antibiotics she is taking and also has a headache. RNCM encouraged patient to contact provider if condition worsens or does not improve. ? ?Follow Up Plan:  Rescheduled for 11/21/21 ? ?Kathyrn Sheriff, RN, MSN, BSN, CCM ?Care Management Coordinator ?[facility] ?[phone]  ?

## 2021-11-18 NOTE — Telephone Encounter (Signed)
PMP was Reviewed.  ?Lyrica e-scribed ?

## 2021-11-21 ENCOUNTER — Encounter: Payer: Self-pay | Admitting: Family Medicine

## 2021-11-21 ENCOUNTER — Other Ambulatory Visit: Payer: Self-pay | Admitting: Family Medicine

## 2021-11-21 ENCOUNTER — Other Ambulatory Visit: Payer: Self-pay

## 2021-11-21 ENCOUNTER — Encounter: Payer: 59 | Attending: Registered Nurse | Admitting: Registered Nurse

## 2021-11-21 ENCOUNTER — Encounter: Payer: Self-pay | Admitting: Registered Nurse

## 2021-11-21 ENCOUNTER — Ambulatory Visit: Payer: 59

## 2021-11-21 DIAGNOSIS — Z89611 Acquired absence of right leg above knee: Secondary | ICD-10-CM | POA: Diagnosis not present

## 2021-11-21 DIAGNOSIS — Z5181 Encounter for therapeutic drug level monitoring: Secondary | ICD-10-CM | POA: Insufficient documentation

## 2021-11-21 DIAGNOSIS — M351 Other overlap syndromes: Secondary | ICD-10-CM

## 2021-11-21 DIAGNOSIS — G894 Chronic pain syndrome: Secondary | ICD-10-CM | POA: Diagnosis not present

## 2021-11-21 DIAGNOSIS — M2550 Pain in unspecified joint: Secondary | ICD-10-CM | POA: Diagnosis not present

## 2021-11-21 DIAGNOSIS — L89891 Pressure ulcer of other site, stage 1: Secondary | ICD-10-CM | POA: Insufficient documentation

## 2021-11-21 DIAGNOSIS — M545 Low back pain, unspecified: Secondary | ICD-10-CM | POA: Diagnosis not present

## 2021-11-21 DIAGNOSIS — G8929 Other chronic pain: Secondary | ICD-10-CM | POA: Insufficient documentation

## 2021-11-21 DIAGNOSIS — Z79899 Other long term (current) drug therapy: Secondary | ICD-10-CM | POA: Insufficient documentation

## 2021-11-21 MED ORDER — HYDROXYCHLOROQUINE SULFATE 200 MG PO TABS: 200.0000 mg | ORAL_TABLET | Freq: Two times a day (BID) | ORAL | 0 refills | Status: DC

## 2021-11-21 MED ORDER — OXYCODONE HCL 20 MG PO TABS: 1.0000 | ORAL_TABLET | Freq: Three times a day (TID) | ORAL | 0 refills | Status: DC | PRN

## 2021-11-21 NOTE — Patient Instructions (Addendum)
Visit Information ? ?Thank you for taking time to visit with me today. Please don't hesitate to contact me if I can be of assistance to you before our next scheduled telephone appointment. ? ?Following are the goals we discussed today:  ?Patient Goals/[name] Activities: ?Take medications as prescribed   ?Attend all scheduled provider appointments ?Call provider office for new concerns or questions  ?Complete dressing changes as prescribed by provider. Contact provider if areas do not continue to improve or worsens ?Contact insurance provider for [employer] [facility] providers and follow up with primary care provider for referral as needed. Discuss with primary care provider.  ?See education on asthma and plan to discuss at next telephone call ? ?Our next appointment is by telephone on 12/12/21 at 2:00 pm ? ?Please call the care guide team at [phone] if you need to cancel or reschedule your appointment.  ? ?If you are experiencing a Mental Health or [place] or need someone to talk to, please call the Suicide and Crisis Lifeline: 988 ?call 1-800-273-TALK (toll free, 24 hour hotline)  ? ?Patient verbalizes understanding of instructions and care plan provided today and agrees to view in [facility]. Active MyChart status confirmed with patient.   ? ?The patient has been provided with contact information for the care management team and has been advised to call with any health related questions or concerns.  ? ?Thea Silversmith, RN, MSN, BSN, CCM ?Care Management Coordinator ?[facility] ?[phone]  ? ?Asthma, Adult ?Asthma is a long-term (chronic) condition in which the airways get tight and narrow. The airways are the breathing passages that lead from the nose and mouth down into the lungs. A person with asthma will have times when symptoms get worse. These are called asthma attacks. They can cause coughing, whistling sounds when you breathe (wheezing), shortness of breath, and chest  pain. They can make it hard to breathe. There is no cure for asthma, but medicines and lifestyle changes can help control it. ?There are many things that can bring on an asthma attack or make asthma symptoms worse (triggers). Common triggers include: ?Mold. ?Dust. ?Cigarette smoke. ?Cockroaches. ?Things that can cause allergy symptoms (allergens). These include animal skin flakes (dander) and pollen from trees or grass. ?Things that pollute the air. These may include household cleaners, wood smoke, smog, or chemical odors. ?Cold air, weather changes, and wind. ?Crying or laughing hard. ?Stress. ?Certain medicines or drugs. ?Certain foods such as dried fruit, potato chips, and grape juice. ?Infections, such as a cold or the flu. ?Certain medical conditions or diseases. ?Exercise or tiring activities. ?Asthma may be treated with medicines and by staying away from the things that cause asthma attacks. Types of medicines may include: ?Controller medicines. These help prevent asthma symptoms. They are usually taken every day. ?Fast-acting reliever or rescue medicines. These quickly relieve asthma symptoms. They are used as needed and provide short-term relief. ?Allergy medicines if your attacks are brought on by allergens. ?Medicines to help control the body's defense (immune) system. ?Follow these instructions at home: ?Avoiding triggers in your home ?Change your heating and air conditioning filter often. ?Limit your use of fireplaces and wood stoves. ?Get rid of pests (such as roaches and mice) and their droppings. ?Throw away plants if you see mold on them. ?Clean your floors. Dust regularly. Use cleaning products that do not smell. ?Have someone vacuum when you are not home. Use a vacuum cleaner with a HEPA filter if possible. ?Replace carpet with wood, tile,  or vinyl flooring. Carpet can trap animal skin flakes and dust. ?Use allergy-proof pillows, mattress covers, and box spring covers. ?Wash bed sheets and  blankets every week in hot water. Dry them in a dryer. ?Keep your bedroom free of any triggers. ?Avoid pets and keep windows closed when things that cause allergy symptoms are in the air. ?Use blankets that are made of polyester or cotton. ?Clean bathrooms and kitchens with bleach. If possible, have someone repaint the walls in these rooms with mold-resistant paint. Keep out of the rooms that are being cleaned and painted. ?Wash your hands often with soap and water. If soap and water are not available, use hand sanitizer. ?Do not allow anyone to smoke in your home. ?General instructions ?Take over-the-counter and prescription medicines only as told by your doctor. ?Talk with your doctor if you have questions about how or when to take your medicines. ?Make note if you need to use your medicines more often than usual. ?Do not use any products that contain nicotine or tobacco, such as cigarettes and e-cigarettes. If you need help quitting, ask your doctor. ?Stay away from secondhand smoke. ?Avoid doing things outdoors when allergen counts are high and when air quality is low. ?Wear a ski mask when doing outdoor activities in the winter. The mask should cover your nose and mouth. Exercise indoors on cold days if you can. ?Warm up before you exercise. Take time to cool down after exercise. ?Use a peak flow meter as told by your doctor. A peak flow meter is a tool that measures how well the lungs are working. ?Keep track of the peak flow meter's readings. Write them down. ?Follow your asthma action plan. This is a written plan for taking care of your asthma and treating your attacks. ?Make sure you get all the shots (vaccines) that your doctor recommends. Ask your doctor about a flu shot and a pneumonia shot. ?Keep all follow-up visits as told by your doctor. This is important. ?Contact a doctor if: ?You have wheezing, shortness of breath, or a cough even while taking medicine to prevent attacks. ?The mucus you cough up  (sputum) is thicker than usual. ?The mucus you cough up changes from clear or white to yellow, green, gray, or bloody. ?You have problems from the medicine you are taking, such as: ?A rash. ?Itching. ?Swelling. ?Trouble breathing. ?You need reliever medicines more than 2-3 times a week. ?Your peak flow reading is still at 50-79% of your personal best after following the action plan for 1 hour. ?You have a fever. ?Get help right away if: ?You seem to be worse and are not responding to medicine during an asthma attack. ?You are short of breath even at rest. ?You get short of breath when doing very little activity. ?You have trouble eating, drinking, or talking. ?You have chest pain or tightness. ?You have a fast heartbeat. ?Your lips or fingernails start to turn blue. ?You are light-headed or dizzy, or you faint. ?Your peak flow is less than 50% of your personal best. ?You feel too tired to breathe normally. ?Summary ?Asthma is a long-term (chronic) condition in which the airways get tight and narrow. An asthma attack can make it hard to breathe. ?Asthma cannot be cured, but medicines and lifestyle changes can help control it. ?Make sure you understand how to avoid triggers and how and when to use your medicines. ?This information is not intended to replace advice given to you by your health care provider. Make sure you  discuss any questions you have with your health care provider. ?Document Revised: 12/21/2019 Document Reviewed: 12/31/2019 ?Elsevier Patient Education ? [place]. ? ?

## 2021-11-21 NOTE — Chronic Care Management (AMB) (Signed)
Care Management    RN Visit Note  11/21/2021 Name: BASIL BLAKESLEY MRN: [id number] DOB: 03/07/1961  Subjective: [name] Gemmill is a 61 y.o. year old female who is a primary care patient of Shelda Pal, DO. The care management team was consulted for assistance with disease management and care coordination needs.    Engaged with patient by telephone for follow up visit in response to provider referral for case management and/or care coordination services.   Consent to Services:   Ms. Sula was given information about Care Management services today including:  Care Management services includes personalized support from designated clinical staff supervised by her physician, including individualized plan of care and coordination with other care providers 24/7 contact phone numbers for assistance for urgent and routine care needs. The patient may stop case management services at any time by phone call to the office staff.  Patient agreed to services and consent obtained.   Assessment: Review of patient past medical history, allergies, medications, health status, including review of consultants reports, laboratory and other test data, was performed as part of comprehensive evaluation and provision of chronic care management services.   SDOH (Social Determinants of Health) assessments and interventions performed:    Care Plan  Allergies  Allergen Reactions   Fish Allergy Anaphylaxis    INCLUDES OMEGA 3 OILS   Fish Oil Anaphylaxis and Swelling    THROAT SWELLS INCLUDES FISH AS A CLASS   Omega-3 Fatty Acids Swelling    Throat swelling  Has tolerated Glucerna nutritional drinks    Crestor [Rosuvastatin] Other (See Comments)    Extreme joint pain, to this & other statins   Gabapentin Anxiety    Anxiety on high doses   Lovastatin Other (See Comments)    EXTREME JOINT PAIN   Metronidazole Nausea Only and Swelling    Headache and shakes Nausea and vomiting   Red Yeast  Rice [Cholestin] Other (See Comments)    Muscle pain and severe joint pain.    Rotigotine Swelling    (Neupro) Extreme edema    Atrovent Hfa [Ipratropium Bromide Hfa] Other (See Comments)    wheezing   Iodine Swelling   Other Other (See Comments)    Surgical Staples causes redness/infection per patient.   Pepto-Bismol [Bismuth Subsalicylate] Nausea And Vomiting    Extreme vomiting   Victoza [Liraglutide]     Unsure of reaction    Enalapril Cough   Suprep [Na Sulfate-K Sulfate-Mg Sulf] Nausea And Vomiting   Tape Other (See Comments)    Electrodes causes skin breakdown    Outpatient Encounter Medications as of 11/21/2021  Medication Sig Note   albuterol (PROAIR HFA) 108 (90 Base) MCG/ACT inhaler Inhale 2 puffs into the lungs every 4 (four) hours as needed for wheezing or shortness of breath.    albuterol (PROVENTIL) (2.5 MG/3ML) 0.083% nebulizer solution USE 1 VIAL IN NEBULIZER EVERY 4 HOURS AS NEEDED FOR WHEEZING FOR SHORTNESS OF BREATH    ascorbic acid (VITAMIN C) 500 MG tablet Take 1 tablet (500 mg total) by mouth 2 (two) times daily.    aspirin EC 81 MG tablet Take 1 tablet (81 mg total) by mouth every evening.    augmented betamethasone dipropionate (DIPROLENE AF) 0.05 % cream Apply topically 2 (two) times daily.    betamethasone dipropionate 0.05 % cream Apply 1 application. topically 2 (two) times daily as needed (extreme itching).    collagenase (SANTYL) ointment Apply 1 application topically daily.    cyclobenzaprine (  FLEXERIL) 10 MG tablet Take 1 tablet (10 mg total) by mouth 3 (three) times daily as needed for muscle spasms. 08/23/2021: Uses tizanidine OR cyclobenzaprine   diclofenac Sodium (VOLTAREN) 1 % GEL Apply 2 g topically 4 (four) times daily. 08/09/2021: Reports using as needed.   DILT-XR 240 MG 24 hr capsule Take 240 mg by mouth daily.    EPINEPHrine (EPIPEN 2-PAK) 0.3 mg/0.3 mL IJ SOAJ injection USE AS DIRECTED FOR SEVERE ALLERGIC REACTION.    ezetimibe (ZETIA) 10  MG tablet Take 1 tablet (10 mg total) by mouth daily.    fluconazole (DIFLUCAN) 150 MG tablet Take 1 tablet (150 mg total) by mouth every 3 (three) days.    fluticasone (FLONASE) 50 MCG/ACT nasal spray USE 2 SPRAY(S) IN EACH NOSTRIL ONCE DAILY AS NEEDED FOR  ALLERGIES  OR  RHINITIS    fluticasone-salmeterol (ADVAIR) 250-50 MCG/ACT AEPB Inhale 1 puff into the lungs in the morning and at bedtime.    folic acid (FOLVITE) 1 MG tablet Take 1,000 mcg by mouth daily at 12 noon.    furosemide (LASIX) 20 MG tablet Take 20-40 mg by mouth 3 (three) times daily as needed (fluid retention/swelling). 08/09/2021: Reports takes 1-2 times a day as needed depending on weight > 5 pounds in a day.   gabapentin (NEURONTIN) 400 MG capsule Take 1 capsule by mouth in the morning, 1 capsule at noon, and 2 capsules at bed time.    GLUCOMANNAN PO Take 1,995 mg by mouth in the morning, at noon, and at bedtime.    glucose blood test strip 1 each by Other route 4 (four) times daily. Contour Next strips    HUMULIN R 500 UNIT/ML injection INJECT 0.08-0.3 MLS (40-150 UNITS TOTAL) INTO THE SKIN THREE TIMES DAILY WITH MEALS    hydrocortisone cream 1 % Apply 1 application topically 3 (three) times daily as needed for itching (minor skin irritation).    hydroxychloroquine (PLAQUENIL) 200 MG tablet Take 1 tablet (200 mg total) by mouth 2 (two) times daily.    hydrOXYzine (ATARAX) 10 MG tablet Take 1 tablet (10 mg total) by mouth 3 (three) times daily as needed.    INSULIN SYRINGE 1CC/29G (B-D INSULIN SYRINGE) 29G X 1/2" 1 ML MISC Use three times daily with insulin.  DX E11.9    isosorbide mononitrate (IMDUR) 30 MG 24 hr tablet Take 1 tablet (30 mg total) by mouth daily. (Patient taking differently: Take 30 mg by mouth at bedtime.)    levocetirizine (XYZAL) 5 MG tablet TAKE 1 TABLET BY MOUTH ONCE DAILY IN THE EVENING    levothyroxine (SYNTHROID) 200 MCG tablet TAKE 1 TABLET BY MOUTH ONCE DAILY BEFORE BREAKFAST    metFORMIN  (GLUCOPHAGE-XR) 500 MG 24 hr tablet Take 2 tablets by mouth twice daily    mupirocin ointment (BACTROBAN) 2 % Apply 1 application topically 2 (two) times daily as needed (wound care).    naloxone (NARCAN) nasal spray 4 mg/0.1 mL Place 1 spray into the nose as needed (opoid overdose).    nitrofurantoin, macrocrystal-monohydrate, (MACROBID) 100 MG capsule Take 1 capsule (100 mg total) by mouth 2 (two) times daily for 5 days. (Patient not taking: Reported on 11/21/2021)    nitroGLYCERIN (NITROSTAT) 0.4 MG SL tablet DISSOLVE ONE TABLET UNDER THE TONGUE EVERY 5 MINUTES AS NEEDED FOR CHEST PAIN.  DO NOT EXCEED A TOTAL OF 3 DOSES IN 15 MINUTES    omeprazole (PRILOSEC) 40 MG capsule Take 1 capsule by mouth once daily    Oxycodone  HCl 20 MG TABS Take 1 tablet (20 mg total) by mouth every 8 (eight) hours as needed.    oxymetazoline (AFRIN) 0.05 % nasal spray Place 2 sprays into both nostrils 2 (two) times daily as needed.    Polyethyl Glycol-Propyl Glycol (SYSTANE) 0.4-0.3 % SOLN Place 1-2 drops into both eyes in the morning.    POT BICARB-POT CHLORIDE,25MEQ, 25 MEQ TBEF Take 25 mEq by mouth 3 (three) times daily as needed (with lasix).    pramipexole (MIRAPEX) 0.5 MG tablet Take 0.5 mg by mouth 2 (two) times daily.    pramipexole (MIRAPEX) 1 MG tablet Take 1 tablet (1 mg total) by mouth See admin instructions. Take 1 tablet (1 mg) by mouth scheduled 3 times daily & take 0.5 tablet (0.5 mg) by mouth twice daily if needed for breakthrough restless leg    pregabalin (LYRICA) 25 MG capsule Take 1 capsule by mouth twice daily    promethazine (PHENERGAN) 25 MG tablet Take 1 tablet (25 mg total) by mouth every 8 (eight) hours as needed for vomiting or nausea.    saccharomyces boulardii (FLORASTOR) 250 MG capsule Take 250 mg by mouth 3 (three) times daily. 08/23/2021: Uses when on antibiotic   tiZANidine (ZANAFLEX) 2 MG tablet Take 1 tablet (2 mg total) by mouth 3 (three) times daily.    valsartan (DIOVAN) 160 MG  tablet Take 1 tablet (160 mg total) by mouth daily.    vitamin B-12 (CYANOCOBALAMIN) 1000 MCG tablet Take 1,000 mcg by mouth in the morning.    Vitamin D, Ergocalciferol, (DRISDOL) 1.25 MG (50000 UNIT) CAPS capsule TAKE 1 CAPSULE BY MOUTH ONCE A WEEK (EVERY  7  DAYS)  TAKE  ON  FRIDAY    Zinc Sulfate 220 (50 Zn) MG TABS Take 1 tablet (220 mg total) by mouth daily. (Patient taking differently: Take 220 mg by mouth at bedtime.)    No facility-administered encounter medications on file as of 11/21/2021.    Patient Active Problem List   Diagnosis Date Noted   Peripheral vascular disease, unspecified ([place]) 10/10/2021   Unilateral AKA, right ([place]) 10/10/2021   Hx of AKA (above knee amputation), right ([place]) 07/22/2021   Hx of BKA, right ([place]) 06/29/2021   Dehiscence of amputation stump ([facility])    Acute blood loss anemia 06/24/2021   Postoperative wound infection 06/24/2021   UTI (urinary tract infection) 06/24/2021   Superficial dehiscence of operation wound 06/24/2021   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 ([place])    Bacteremia due to Enterococcus 05/28/2021   Sepsis ([place]) 05/26/2021   Digestive disorder 05/18/2021   Bilateral lower extremity edema 05/02/2021   Statin myopathy 03/04/2021   Aortic stenosis 03/04/2021   Sjogren's syndrome ([place]) 02/11/2020   Cutaneous abscess of right foot    Statin intolerance 08/16/2019   Gram-negative bacteremia 10/18/2018   Streptococcal bacteremia 10/18/2018   CAD S/P percutaneous coronary angioplasty 04/19/2018   Essential hypertension    Moderate persistent asthma 03/21/2018   NAFLD (nonalcoholic fatty liver disease)    Recurrent cellulitis of lower extremity 01/02/2018   Cellulitis of lower leg 01/02/2018   Chronic diarrhea 05/08/2017   H/O Clostridium difficile infection 05/08/2017   Rectal bleeding 05/08/2017   Hyperbilirubinemia 04/29/2016   Hyponatremia 04/29/2016   Cellulitis of right foot 03/09/2016   Mild  persistent asthma 02/15/2016   Allergic rhinitis due to pollen 02/15/2016   Anaphylactic reaction due to food 02/10/2016   Diabetes mellitus type 2 in obese ([place]) 02/10/2016  Gastroesophageal reflux disease without esophagitis 02/10/2016   Atopic eczema 02/10/2016   Cervical nerve root disorder 12/15/2015   Lumbar radiculopathy 12/15/2015   Daytime somnolence 11/26/2015   Venous stasis dermatitis of both lower extremities 02/11/2015   Morbid obesity ([place]) 06/19/2014   Abnormal LFTs 03/26/2014   B-complex deficiency 03/26/2014   Benign essential HTN 03/26/2014   Chronic pain associated with significant psychosocial dysfunction 03/26/2014   Diaphragmatic hernia 03/26/2014   Gastroesophageal reflux disease 03/26/2014   Hammer toe 03/26/2014   H/O neoplasm 03/26/2014   Adaptive colitis 03/26/2014   Diabetic polyneuropathy ([place]) 03/26/2014   Deafness, sensorineural 03/26/2014   Fibromyalgia 01/20/2014   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/20/2014   Hyperlipidemia 11/11/2012   Mixed connective tissue disease ([place]) 11/11/2012   Hypothyroidism 99/35/7017   Uncomplicated asthma 79/39/0300   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[place]) 02/20/2012   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   Start Date: 08/09/2021  Expected End Date: 05/24/2022  Priority: High  Note:   Current Barriers: States general report of not feeling well today. She reports Video visit completed today with physical medicine. Per  chart: Stage 1 decubiti- two areas to left buttocks/leg area. She states they are healing. Medications reviewed. She reports she has had to use inhaler more often and reports it helps. RNCM encouraged patient to contact primary care provider if symptoms do not improve or if symptoms are not relieved. She reports she did not complete full course of physical therapy due to insurance issues. She states she has an appointment next week with orthopedic provider. RNCM encouraged patient to discuss at that appointment.  History of chronic pain. States she is in need of establishing care with a new: allergist, rheumatologist and neurologist. RNCM encouraged Mrs. Pons to contact insurance to see what providers are in network and follow up with PCP for referral as needed. Frequent admission: 05/26/21-06/08/21 admitted with Bacteremia, R BKA on 06/01/21; 06/08/21-06/24/21 Rehab; 06/29/21-07/01/21 dehiscence of amputation stump/Revision of R BKA on 06/29/21. ; 07/22/20-07/29/20 R AKA on 07/22/21 Knowledge Deficits related to plan of care for management of transition of care post right AKA  Care Coordination needs related to Limited social support   RNCM Clinical Goal(s):  Patient will verbalize understanding of plan for management of Right AKA as evidenced by self report take all medications exactly as prescribed and will call provider for medication related questions as evidenced by self report and/or chart notation    work with Education officer, museum to address feelings related to recent AKA related to the management of her health as evidenced by review of EMR and patient or social worker report     work with Data processing manager care guide to address needs related to Transportation and would like [employer] information  as evidenced by patient and/or community resource care guide support    through collaboration with Consulting civil engineer, provider, and care team.   Interventions: 1:1 collaboration with primary care  provider regarding development and update of comprehensive  plan of care as evidenced by provider attestation and co-signature Inter-disciplinary care team collaboration (see longitudinal plan of care) Evaluation of current treatment plan related to  self management and patient's adherence to plan as established by provider  Transition of care post Right AKA  (Status:  Goal Met.)  Long Term Goal Discussed plans with patient for ongoing care management follow up and provided patient with direct contact information for care management team Evaluation of current treatment plan related to recent R AKA and patient's adherence to plan as established by provider Advised patient to contact provider with questions or concerns. Reviewed medications with patient and discussed importance of taking as prescribed. Care Guide referral for transportation resources, financial constraints, food resource information. Social Work referral for mental health: feelings re: her health; recent AKA and financial constraints Pharmacy referral for medication review, polypharmacy Discussed plans with patient for ongoing care management follow up and provided patient with direct contact information for care management team Discussed wound care and encouraged patient to continue to work with home health providers as scheduled.    Alteration in skin integrity interventions:  (Status:  New goal.)  Long Term Goal Encouraged to continue to attend provider visit as scheduled. Encouraged to contact provider if condition does not continue to improve or worsens Advised patient to continue to follow provider recommendation regarding care of areas   Asthma: (Status:New goal.) Long Term Goal Provided patient with basic written and verbal Asthma education on self care/management/and exacerbation prevention Advised patient to track and manage Asthma triggers  Encouraged to follow up with provider if condition worsens or does not  improve  Specialty Provider needs Interventions:  (Status:  New goal.)  Long Term Goal Per patient, she states she needs to establish care with another rheumatologist; neurologist and allergist. Advised patient to contact insurance provider for in-network providers and follow up with primary care provider for referral as needed. Per chart referral to rheumatologist in process. Encouraged to contact primary care provider for questions/concerns or any worsening issues  Encouraged to take medications as prescribed.  Patient Goals/[name] Activities: Take medications as prescribed   Attend all scheduled provider appointments Call provider office for new concerns or questions  Complete dressing changes as prescribed by provider. Contact provider if areas do not continue to improve or worsens Contact insurance provider for in-network Specialty providers and follow up with primary care provider for referral as needed. Discuss with primary care provider.  See education on asthma and plan to discuss at next telephone call      Plan: Telephone follow up appointment with care management team member scheduled for:  12/12/21 The patient has been provided with contact information for the care management team and has been advised to call with any health related questions or concerns.   Thea Silversmith, RN, MSN, BSN, CCM Care Management Coordinator [facility] [phone]

## 2021-11-21 NOTE — Progress Notes (Signed)
Subjective:    Patient ID: [name], female    DOB: 1960/12/21, 61 y.o.   MRN: [id number]  HPI: [name] is a 61 y.o. female who called office this morning reporting she was sick  ( fever and coughing) , she was instructed to F/U with her PCP, she verbalizes understanding. Her appointment was changed to a My-Chart Video Visit. [name] and I have  discussed the limitations of evaluation and management by telemedicine and the availability of in person appointments. The patient expressed understanding and agreed to proceed. She states her pain is located in her lower back and left lower extremity decubitus site, she also reports she has stopped going to wound care and taking care of  her decubitus ulcer  herself. She was instructed to F/U with her PCP regarding her wound care, she verbalizes understanding. She rates her pain 7. Her current exercise regime is walking short distances with her walker.  [name] Morphine equivalent is 90.00 MME.   Last UDS was Performed on 07/15/2021, it was consistent.     Pain Inventory Average Pain 6 Pain Right Now 7 My pain is constant and aching  In the last 24 hours, has pain interfered with the following? General activity 6 Relation with others 6 Enjoyment of life 6 What TIME of day is your pain at its worst? varies Sleep (in general) Poor  Pain is worse with: unsure Pain improves with: therapy/exercise and medication Relief from Meds: 7  Family History  Problem Relation Age of Onset   CAD Mother    Hypertension Mother    Heart attack Mother    CAD Father    Heart attack Father    Allergic rhinitis Father    Asthma Father    Hypertension Brother    Hypertension Brother    Pancreatic cancer Paternal Aunt    Breast cancer Paternal Aunt    Lung cancer Paternal Aunt    Asthma Son    Social History   Socioeconomic History   Marital status: Married    Spouse name: [name]   Number of children: 1    Years of education: 12   Highest education level: 12th grade  Occupational History   Not on file  Tobacco Use   Smoking status: Former    Packs/day: 1.00    Years: 19.00    Pack years: 19.00    Types: Cigarettes    Start date: 43    Quit date: 02/11/2004    Years since quitting: 17.7    Passive exposure: Past   Smokeless tobacco: Never  Vaping Use   Vaping Use: Never used  Substance and Sexual Activity   Alcohol use: Yes    Comment: RARE Wine   Drug use: Not Currently    Types: Marijuana    Comment: "only in my teens"   Sexual activity: Yes    Birth control/protection: Surgical    Comment: Hysterectomy  Other Topics Concern   Not on file  Social History Narrative   Not on file   Social Determinants of Health   Financial Resource Strain: High Risk   Difficulty of Paying Living Expenses: Very hard  Food Insecurity: Food Insecurity Present   Worried About Running Out of Food in the Last Year: Sometimes true   Ran Out of Food in the Last Year: Sometimes true  Transportation Needs: No Transportation Needs   Lack of Transportation (Medical): No   Lack of Transportation (Non-Medical): No  Physical  Activity: Inactive   Days of Exercise per Week: 0 days   Minutes of Exercise per Session: 0 min  Stress: Stress Concern Present   Feeling of Stress : Very much  Social Connections: Moderately Integrated   Frequency of Communication with Friends and Family: More than three times a week   Frequency of Social Gatherings with Friends and Family: More than three times a week   Attends Religious Services: More than 4 times per year   Active Member of Clubs or Organizations: No   Attends Archivist Meetings: Never   Marital Status: Married   Past Surgical History:  Procedure Laterality Date   ABDOMINAL HYSTERECTOMY  06/2005   "w/right ovariy"   AMPUTATION Right 01/28/2020    RIGHT FOURTH AND FIFTH RAY AMPUTATION   AMPUTATION Right 01/28/2020    Procedure: RIGHT FOURTH AND FIFTH RAY AMPUTATION,;  Surgeon: Newt Minion, MD;  Location: [place];  Service: Orthopedics;  Laterality: Right;   AMPUTATION Right 06/01/2021   Procedure: RIGHT BELOW KNEE AMPUTATION;  Surgeon: Newt Minion, MD;  Location: [facility];  Service: Orthopedics;  Laterality: Right;   AMPUTATION Right 07/22/2021   Procedure: RIGHT ABOVE KNEE AMPUTATION;  Surgeon: Newt Minion, MD;  Location: [facility];  Service: Orthopedics;  Laterality: Right;   APPENDECTOMY     BREAST BIOPSY Bilateral    9 total (04/18/2018)   CORONARY ANGIOPLASTY WITH STENT PLACEMENT  10/01/2015   normal LM, 30% LAD, 85% mid RCA and 95% distal RCA s/p PCI of the mid to distal RCA and now on DAPT with ASA and Ticagrelor.     CORONARY STENT INTERVENTION Right 04/18/2018   Procedure: CORONARY STENT INTERVENTION;  Surgeon: Burnell Blanks, MD;  Location: [facility];  Service: Cardiovascular;  Laterality: Right;   DILATION AND CURETTAGE OF UTERUS     FRACTURE SURGERY     LAPAROSCOPIC CHOLECYSTECTOMY     LEFT HEART CATH AND CORONARY ANGIOGRAPHY N/A 04/18/2018   Procedure: LEFT HEART CATH AND CORONARY ANGIOGRAPHY;  Surgeon: Burnell Blanks, MD;  Location: [facility];  Service: Cardiovascular;  Laterality: N/A;   MUSCLE BIOPSY Left    "leg"   OOPHORECTOMY  07/2010   RADIOLOGY WITH ANESTHESIA N/A 05/25/2016   Procedure: RADIOLOGY WITH ANESTHESIA;  Surgeon: Medication Radiologist, MD;  Location: [facility];  Service: Radiology;  Laterality: N/A;   STUMP REVISION Right 06/29/2021   Procedure: REVISION RIGHT BELOW KNEE AMPUTATION;  Surgeon: Newt Minion, MD;  Location: [facility];  Service: Orthopedics;  Laterality: Right;   TEE WITHOUT CARDIOVERSION N/A 06/01/2021   Procedure: TRANSESOPHAGEAL ECHOCARDIOGRAM (TEE);  Surgeon: Geralynn Rile, MD;  Location: [facility];  Service: Cardiovascular;  Laterality: N/A;   TONSILLECTOMY AND ADENOIDECTOMY     WRIST FRACTURE SURGERY Left     "crushed it"   Past Surgical History:  Procedure Laterality Date   ABDOMINAL HYSTERECTOMY  06/2005   "w/right ovariy"   AMPUTATION Right 01/28/2020    RIGHT FOURTH AND FIFTH RAY AMPUTATION   AMPUTATION Right 01/28/2020   Procedure: RIGHT FOURTH AND FIFTH RAY AMPUTATION,;  Surgeon: Newt Minion, MD;  Location: [facility];  Service: Orthopedics;  Laterality: Right;   AMPUTATION Right 06/01/2021   Procedure: RIGHT BELOW KNEE AMPUTATION;  Surgeon: Newt Minion, MD;  Location: [facility];  Service: Orthopedics;  Laterality: Right;   AMPUTATION Right 07/22/2021   Procedure: RIGHT ABOVE KNEE AMPUTATION;  Surgeon: Newt Minion, MD;  Location: [facility];  Service: Orthopedics;  Laterality: Right;   APPENDECTOMY     BREAST BIOPSY Bilateral    9 total (04/18/2018)   CORONARY ANGIOPLASTY WITH STENT PLACEMENT  10/01/2015   normal LM, 30% LAD, 85% mid RCA and 95% distal RCA s/p PCI of the mid to distal RCA and now on DAPT with ASA and Ticagrelor.     CORONARY STENT INTERVENTION Right 04/18/2018   Procedure: CORONARY STENT INTERVENTION;  Surgeon: Burnell Blanks, MD;  Location: [facility];  Service: Cardiovascular;  Laterality: Right;   DILATION AND CURETTAGE OF UTERUS     FRACTURE SURGERY     LAPAROSCOPIC CHOLECYSTECTOMY     LEFT HEART CATH AND CORONARY ANGIOGRAPHY N/A 04/18/2018   Procedure: LEFT HEART CATH AND CORONARY ANGIOGRAPHY;  Surgeon: Burnell Blanks, MD;  Location: [facility];  Service: Cardiovascular;  Laterality: N/A;   MUSCLE BIOPSY Left    "leg"   OOPHORECTOMY  07/2010   RADIOLOGY WITH ANESTHESIA N/A 05/25/2016   Procedure: RADIOLOGY WITH ANESTHESIA;  Surgeon: Medication Radiologist, MD;  Location: [facility];  Service: Radiology;  Laterality: N/A;   STUMP REVISION Right 06/29/2021   Procedure: REVISION RIGHT BELOW KNEE AMPUTATION;  Surgeon: Newt Minion, MD;  Location: [facility];  Service: Orthopedics;  Laterality: Right;   TEE WITHOUT CARDIOVERSION N/A  06/01/2021   Procedure: TRANSESOPHAGEAL ECHOCARDIOGRAM (TEE);  Surgeon: Geralynn Rile, MD;  Location: [facility];  Service: Cardiovascular;  Laterality: N/A;   TONSILLECTOMY AND ADENOIDECTOMY     WRIST FRACTURE SURGERY Left    "crushed it"   Past Medical History:  Diagnosis Date   Aortic stenosis    mild AS by echo 02/2021   Asthma    "on daily RX and rescue inhaler" (04/18/2018)   Chronic diastolic CHF (congestive heart failure) ([place]) 09/2015   Chronic lower back pain    Chronic neck pain    Chronic pain syndrome    Fentanyl Patch   Colon polyps    Coronary artery disease    cath with normal LM, 30% LAD, 85% mid RCA and 95% distal RCA s/p PCI of the mid to distal RCA and now on DAPT with ASA and Ticagrelor.     Eczema    Excessive daytime sleepiness 11/26/2015   Fibromyalgia    Gallstones    GERD (gastroesophageal reflux disease)    Heart murmur    "noted for the 1st time on 04/18/2018"   History of blood transfusion 07/2010   "S/P oophorectomy"   History of gout    History of hiatal hernia 1980s   "gone now" (04/18/2018)   Hyperlipidemia    Hypertension    takes Metoprolol and Enalapril daily   Hypothyroidism    takes Synthroid daily   IBS (irritable bowel syndrome)    Migraine    "nothing in the 2000s" (04/18/2018)   Mixed connective tissue disease ([place])    NAFLD (nonalcoholic fatty liver disease)    Pneumonia    "several times" (04/18/2018)   PVC's (premature ventricular contractions)    noted on event monitor 02/2021   Rheumatoid arthritis ([place])    "hands, elbows, shoulders, probably knees" (04/18/2018)   Scoliosis    Sleep apnea    mild - does not use cpap   Spondylosis    Type II diabetes mellitus ([place])    takes Metformin and Hum R daily (04/18/2018)   Walker as ambulation aid    also uses wheelchair   There were no vitals taken  for this visit.  Opioid Risk Score:   Fall Risk Score:  `1  Depression screen PHQ 2/9  Depression  screen [facility] 2/9 10/24/2021 09/27/2021 08/25/2021 08/09/2021 07/15/2021 05/02/2021 10/27/2020  Decreased Interest 1 0 0 0 0 0 0  Down, Depressed, Hopeless 1 0 1 1 0 1 0  PHQ - 2 Score 2 0 1 1 0 1 0  Altered sleeping - 0 - - 3 - -  Tired, decreased energy - - - - 1 - -  Change in appetite - - - - 1 - -  Feeling bad or failure about yourself  - - - - 3 - -  Trouble concentrating - - - - 0 - -  Moving slowly or fidgety/restless - - - - 0 - -  Suicidal thoughts - - - - 0 - -  PHQ-9 Score - 0 - - 8 - -  Some recent data might be hidden     Review of Systems  Musculoskeletal:  Positive for back pain and myalgias.  All other systems reviewed and are negative.     Objective:   Physical Exam Vitals and nursing note reviewed.  Musculoskeletal:     Comments: No Physical Exam Performed: My Chart Video Visit         Assessment & Plan:  HX: of Right AKA: Dr Sharol Given Following. Continue to Monitor. 11/21/2021 Diabetic Polyneuropathy associated with Type 2 DM: Continue current medication regimen with Lyrica. Continue to Monitor. 11/21/2021 DM Type 2: PCP Following. Continue current medication regimen. Continue to Monitor. 11/21/2021 Chronic  Pain Syndrome : Refilled: Oxycodone 20 mg one tablet every 8 hours as needed for pain #90. We will continue the opioid monitoring program, this consists of regular clinic visits, examinations, urine drug screen, pill counts as well as use of [place] Controlled Substance Reporting system. A 12 month History has been reviewed on the [place] Controlled Substance Reporting System on 11/21/2021 Stage 1 Decubitus: Left Buttock and Left Lower Extremity: Continue Adequate Nutrition and Weight Shifting.  She stated she is no longer going to Wound Care and she is taking care of her ulcer herself. She was instructed to F/U with her PCP.  Continue to Monitor. 11/21/2021   F/U in 1 Month    My: Chart Video Visit Location of Patient: In her Home Location of  Provider: In the Office

## 2021-11-25 ENCOUNTER — Ambulatory Visit: Payer: 59 | Admitting: Family

## 2021-11-28 ENCOUNTER — Ambulatory Visit: Payer: 59 | Admitting: *Deleted

## 2021-11-28 DIAGNOSIS — R7881 Bacteremia: Secondary | ICD-10-CM

## 2021-11-28 DIAGNOSIS — E119 Type 2 diabetes mellitus without complications: Secondary | ICD-10-CM

## 2021-11-28 DIAGNOSIS — M797 Fibromyalgia: Secondary | ICD-10-CM

## 2021-11-28 DIAGNOSIS — K529 Noninfective gastroenteritis and colitis, unspecified: Secondary | ICD-10-CM

## 2021-11-28 DIAGNOSIS — R300 Dysuria: Secondary | ICD-10-CM

## 2021-11-28 DIAGNOSIS — K929 Disease of digestive system, unspecified: Secondary | ICD-10-CM

## 2021-11-28 DIAGNOSIS — B952 Enterococcus as the cause of diseases classified elsewhere: Secondary | ICD-10-CM

## 2021-11-28 DIAGNOSIS — E1169 Type 2 diabetes mellitus with other specified complication: Secondary | ICD-10-CM

## 2021-11-28 DIAGNOSIS — L03115 Cellulitis of right lower limb: Secondary | ICD-10-CM

## 2021-11-28 DIAGNOSIS — J4540 Moderate persistent asthma, uncomplicated: Secondary | ICD-10-CM

## 2021-11-28 DIAGNOSIS — B955 Unspecified streptococcus as the cause of diseases classified elsewhere: Secondary | ICD-10-CM

## 2021-11-28 DIAGNOSIS — I739 Peripheral vascular disease, unspecified: Secondary | ICD-10-CM

## 2021-11-28 DIAGNOSIS — T8781 Dehiscence of amputation stump: Secondary | ICD-10-CM

## 2021-11-28 NOTE — Patient Instructions (Addendum)
Visit Information ? ?Thank you for taking time to visit with me today. Please don't hesitate to contact me if I can be of assistance to you before our next scheduled telephone appointment. ? ?Following are the goals we discussed today:  ?Patient Goals/[name] Activities:  ?Thorough review and successful completion of applications for:   ? ~ Social Security Disability ?~ Adult Medicaid ?~ [employer] Assistance Program ?~ [employer] Program  ?~ Psychologist, educational Nutrition Assistance Program  ?Continue to review and utilize agencies of interest, from the following list of resources provided: ?~ Emergency Assistance Programs ?~ Armed forces logistics/support/administrative officer, Stage manager and [employer]s ?~ [employer] [place] Resources ?~ [employer]   ?Contact LCSW directly (# [id number]), if you have questions, need assistance, or if additional social work needs are identified in the near future.   ?No Follow-Up Required. ? ? ?Please call the care guide team at [phone] if you need to cancel or reschedule your appointment.  ? ?If you are experiencing a Mental Health or Behavioral Health Crisis or need someone to talk to, please call the Suicide and Crisis Lifeline: 988 ?call the [place] National Suicide Prevention Lifeline: 786-718-3208 or TTY: 563-180-1182 TTY 819-709-5658) to talk to a trained counselor ?call 1-800-273-TALK (toll free, 24 hour hotline) ?go to [facility] [address], [place] [phone]) ?call the [facility]: [phone] ?call 911  ? ?Patient verbalizes understanding of instructions and care plan provided today and agrees to view in MyChart. Active MyChart status confirmed with patient.   ? ?Danford Bad LCSW ?Licensed Clinical Social Worker ?[facility] ?[phone]  ?

## 2021-11-28 NOTE — Chronic Care Management (AMB) (Signed)
Care Management ?Clinical Social Work Note ? ?11/28/2021 ?Name: DARIONNA BANKE MRN: [id number] DOB: 1960/12/26 ? ?Jonnie Judie Petit Strum is a 61 y.o. year old female who is a primary care patient of Sharlene Dory, DO.  The Care Management team was consulted for assistance with chronic disease management and coordination needs. ? ?Engaged with patient by telephone for follow up visit in response to provider referral for social work chronic care management and care coordination services ? ?Consent to Services:  ?Ms. Lanza was given information about Care Management services today including:  ?Care Management services includes personalized support from designated clinical staff supervised by her physician, including individualized plan of care and coordination with other care providers ?24/7 contact phone numbers for assistance for urgent and routine care needs. ?The patient may stop case management services at any time by phone call to the office staff. ? ?Patient agreed to services and consent obtained.  ? ?Assessment: Review of patient past medical history, allergies, medications, and health status, including review of relevant consultants reports was performed today as part of a comprehensive evaluation and provision of chronic care management and care coordination services. ? ?SDOH (Social Determinants of Health) assessments and interventions performed:   ? ?Advanced Directives Status: Not addressed in this encounter. ? ?Care Plan ? ?Allergies  ?Allergen Reactions  ? Fish Allergy Anaphylaxis  ?  INCLUDES OMEGA 3 OILS  ? Fish Oil Anaphylaxis and Swelling  ?  THROAT SWELLS ?INCLUDES FISH AS A CLASS  ? Omega-3 Fatty Acids Swelling  ?  Throat swelling  ?Has tolerated Glucerna nutritional drinks ?  ? Crestor [Rosuvastatin] Other (See Comments)  ?  Extreme joint pain, to this & other statins  ? Gabapentin Anxiety  ?  Anxiety on high doses  ? Lovastatin Other (See Comments)  ?  EXTREME JOINT PAIN  ? Metronidazole  Nausea Only and Swelling  ?  Headache and shakes ?Nausea and vomiting  ? Red Yeast Rice [Cholestin] Other (See Comments)  ?  Muscle pain and severe joint pain.   ? Rotigotine Swelling  ?  (Neupro) Extreme edema ?  ? Atrovent Hfa [Ipratropium Bromide Hfa] Other (See Comments)  ?  wheezing  ? Iodine Swelling  ? Other Other (See Comments)  ?  Surgical Staples causes redness/infection per patient.  ? Pepto-Bismol [Bismuth Subsalicylate] Nausea And Vomiting  ?  Extreme vomiting  ? Victoza [Liraglutide]   ?  Unsure of reaction   ? Enalapril Cough  ? Suprep [Na Sulfate-K Sulfate-Mg Sulf] Nausea And Vomiting  ? Tape Other (See Comments)  ?  Electrodes causes skin breakdown  ? ? ?Outpatient Encounter Medications as of 11/28/2021  ?Medication Sig Note  ? albuterol (PROAIR HFA) 108 (90 Base) MCG/ACT inhaler Inhale 2 puffs into the lungs every 4 (four) hours as needed for wheezing or shortness of breath.   ? albuterol (PROVENTIL) (2.5 MG/3ML) 0.083% nebulizer solution USE 1 VIAL IN NEBULIZER EVERY 4 HOURS AS NEEDED FOR WHEEZING FOR SHORTNESS OF BREATH   ? ascorbic acid (VITAMIN C) 500 MG tablet Take 1 tablet (500 mg total) by mouth 2 (two) times daily.   ? aspirin EC 81 MG tablet Take 1 tablet (81 mg total) by mouth every evening.   ? augmented betamethasone dipropionate (DIPROLENE AF) 0.05 % cream Apply topically 2 (two) times daily.   ? betamethasone dipropionate 0.05 % cream Apply 1 application. topically 2 (two) times daily as needed (extreme itching).   ? collagenase (SANTYL) ointment Apply 1 application topically  daily.   ? cyclobenzaprine (FLEXERIL) 10 MG tablet Take 1 tablet (10 mg total) by mouth 3 (three) times daily as needed for muscle spasms. 08/23/2021: Uses tizanidine OR cyclobenzaprine  ? diclofenac Sodium (VOLTAREN) 1 % GEL Apply 2 g topically 4 (four) times daily. 08/09/2021: Reports using as needed.  ? DILT-XR 240 MG 24 hr capsule Take 240 mg by mouth daily.   ? EPINEPHrine (EPIPEN 2-PAK) 0.3 mg/0.3 mL IJ SOAJ  injection USE AS DIRECTED FOR SEVERE ALLERGIC REACTION.   ? ezetimibe (ZETIA) 10 MG tablet Take 1 tablet (10 mg total) by mouth daily.   ? fluconazole (DIFLUCAN) 150 MG tablet Take 1 tablet (150 mg total) by mouth every 3 (three) days.   ? fluticasone (FLONASE) 50 MCG/ACT nasal spray USE 2 SPRAY(S) IN EACH NOSTRIL ONCE DAILY AS NEEDED FOR  ALLERGIES  OR  RHINITIS   ? fluticasone-salmeterol (ADVAIR) 250-50 MCG/ACT AEPB Inhale 1 puff into the lungs in the morning and at bedtime.   ? folic acid (FOLVITE) 1 MG tablet Take 1,000 mcg by mouth daily at 12 noon.   ? furosemide (LASIX) 20 MG tablet Take 20-40 mg by mouth 3 (three) times daily as needed (fluid retention/swelling). 08/09/2021: Reports takes 1-2 times a day as needed depending on weight > 5 pounds in a day.  ? gabapentin (NEURONTIN) 400 MG capsule Take 1 capsule by mouth in the morning, 1 capsule at noon, and 2 capsules at bed time.   ? GLUCOMANNAN PO Take 1,995 mg by mouth in the morning, at noon, and at bedtime.   ? glucose blood test strip 1 each by Other route 4 (four) times daily. Contour Next strips   ? HUMULIN R 500 UNIT/ML injection INJECT 0.08-0.3 MLS (40-150 UNITS TOTAL) INTO THE SKIN THREE TIMES DAILY WITH MEALS   ? hydrocortisone cream 1 % Apply 1 application topically 3 (three) times daily as needed for itching (minor skin irritation).   ? hydroxychloroquine (PLAQUENIL) 200 MG tablet Take 1 tablet (200 mg total) by mouth 2 (two) times daily.   ? hydrOXYzine (ATARAX) 10 MG tablet Take 1 tablet (10 mg total) by mouth 3 (three) times daily as needed.   ? INSULIN SYRINGE 1CC/29G (B-D INSULIN SYRINGE) 29G X 1/2" 1 ML MISC Use three times daily with insulin.  DX E11.9   ? isosorbide mononitrate (IMDUR) 30 MG 24 hr tablet Take 1 tablet (30 mg total) by mouth daily. (Patient taking differently: Take 30 mg by mouth at bedtime.)   ? levocetirizine (XYZAL) 5 MG tablet TAKE 1 TABLET BY MOUTH ONCE DAILY IN THE EVENING   ? levothyroxine (SYNTHROID) 200 MCG  tablet TAKE 1 TABLET BY MOUTH ONCE DAILY BEFORE BREAKFAST   ? metFORMIN (GLUCOPHAGE-XR) 500 MG 24 hr tablet Take 2 tablets by mouth twice daily   ? mupirocin ointment (BACTROBAN) 2 % Apply 1 application topically 2 (two) times daily as needed (wound care).   ? naloxone (NARCAN) nasal spray 4 mg/0.1 mL Place 1 spray into the nose as needed (opoid overdose).   ? nitroGLYCERIN (NITROSTAT) 0.4 MG SL tablet DISSOLVE ONE TABLET UNDER THE TONGUE EVERY 5 MINUTES AS NEEDED FOR CHEST PAIN.  DO NOT EXCEED A TOTAL OF 3 DOSES IN 15 MINUTES   ? omeprazole (PRILOSEC) 40 MG capsule Take 1 capsule by mouth once daily   ? Oxycodone HCl 20 MG TABS Take 1 tablet (20 mg total) by mouth every 8 (eight) hours as needed.   ? oxymetazoline (AFRIN) 0.05 %  nasal spray Place 2 sprays into both nostrils 2 (two) times daily as needed.   ? Polyethyl Glycol-Propyl Glycol (SYSTANE) 0.4-0.3 % SOLN Place 1-2 drops into both eyes in the morning.   ? POT BICARB-POT CHLORIDE,25MEQ, 25 MEQ TBEF Take 25 mEq by mouth 3 (three) times daily as needed (with lasix).   ? pramipexole (MIRAPEX) 0.5 MG tablet Take 0.5 mg by mouth 2 (two) times daily.   ? pramipexole (MIRAPEX) 1 MG tablet Take 1 tablet (1 mg total) by mouth See admin instructions. Take 1 tablet (1 mg) by mouth scheduled 3 times daily & take 0.5 tablet (0.5 mg) by mouth twice daily if needed for breakthrough restless leg   ? pregabalin (LYRICA) 25 MG capsule Take 1 capsule by mouth twice daily   ? promethazine (PHENERGAN) 25 MG tablet Take 1 tablet (25 mg total) by mouth every 8 (eight) hours as needed for vomiting or nausea.   ? saccharomyces boulardii (FLORASTOR) 250 MG capsule Take 250 mg by mouth 3 (three) times daily. 08/23/2021: Uses when on antibiotic  ? tiZANidine (ZANAFLEX) 2 MG tablet Take 1 tablet (2 mg total) by mouth 3 (three) times daily.   ? valsartan (DIOVAN) 160 MG tablet Take 1 tablet (160 mg total) by mouth daily.   ? vitamin B-12 (CYANOCOBALAMIN) 1000 MCG tablet Take 1,000 mcg by  mouth in the morning.   ? Vitamin D, Ergocalciferol, (DRISDOL) 1.25 MG (50000 UNIT) CAPS capsule TAKE 1 CAPSULE BY MOUTH ONCE A WEEK (EVERY  7  DAYS)  TAKE  ON  FRIDAY   ? Zinc Sulfate 220 (50 Zn) MG TABS [place]

## 2021-12-02 ENCOUNTER — Ambulatory Visit: Payer: 59 | Admitting: Family

## 2021-12-02 ENCOUNTER — Telehealth: Payer: Self-pay | Admitting: *Deleted

## 2021-12-02 MED ORDER — OXYCODONE HCL 20 MG PO TABS: 1.0000 | ORAL_TABLET | Freq: Three times a day (TID) | ORAL | 0 refills | Status: DC | PRN

## 2021-12-02 NOTE — Telephone Encounter (Signed)
[facility] did not have full amount of oxycodone for rx 11/21/21 #90. They gave her #29.  They need another Rx to give her the remainder of her Rx since they now have received new shipment. (#61 tablets) ?

## 2021-12-02 NOTE — Telephone Encounter (Signed)
PMP was Reviewed.  ?Oxycodone e-scribed .[name] is aware of the above.  ?

## 2021-12-12 ENCOUNTER — Ambulatory Visit: Payer: 59

## 2021-12-12 NOTE — Patient Instructions (Signed)
Visit Information ? ?Thank you for taking time to visit with me today. Please don't hesitate to contact me if I can be of assistance to you before our next scheduled telephone appointment. ? ?Following are the goals we discussed today:  ?Patient Goals/[name] Activities: ?Begin Advair as prescribed. ?Contact Specialist Providers to get on their schedule: Neurologist; Gastroenterologist; Allergist: Eye exam ?Take medications as prescribed   ?Attend all scheduled provider appointments ?Call provider office for new concerns or questions  ?Contact insurance provider for [employer]-network Specialty providers and follow up with primary care provider for referral as needed  ? ?Our next appointment is by telephone on 12/26/21 at 3:00 pm ? ?Please call the care guide team at [phone] if you need to cancel or reschedule your appointment.  ? ?If you are experiencing a Mental Health or Behavioral Health Crisis or need someone to talk to, please call the Suicide and Crisis Lifeline: 988 ?call 1-800-273-TALK (toll free, 24 hour hotline)  ? ?Patient verbalizes understanding of instructions and care plan provided today and agrees to view in MyChart. Active MyChart status confirmed with patient.   ? ?Kathyrn SheriffJuana Nissa Stannard, RN, MSN, BSN, CCM ?Care Management Coordinator ?[facility] ?[phone]  ?

## 2021-12-12 NOTE — Chronic Care Management (AMB) (Signed)
? Care Management ?  ? RN Visit Note ? ?12/12/2021 ?Name: [name] MRN: [id number] DOB: 26-May-1961 ? ?Subjective: ?[name] is a 61 y.o. year old female who is a primary care patient of [name] Pal, DO. The care management team was consulted for assistance with disease management and care coordination needs.   ? ?Engaged with patient by telephone for follow up visit in response to provider referral for case management and/or care coordination services.  ? ?Consent to Services:  ? [name] was given information about Care Management services today including:  ?Care Management services includes personalized support from designated clinical staff supervised by her physician, including individualized plan of care and coordination with other care providers ?24/7 contact phone numbers for assistance for urgent and routine care needs. ?The patient may stop case management services at any time by phone call to the office staff. ? ?Patient agreed to services and consent obtained.  ? ?Assessment: Review of patient past medical history, allergies, medications, health status, including review of consultants reports, laboratory and other test data, was performed as part of comprehensive evaluation and provision of chronic care management services.  ? ?SDOH (Social Determinants of Health) assessments and interventions performed:   ? ?Care Plan ? ?Allergies  ?Allergen Reactions  ? Fish Allergy Anaphylaxis  ?  INCLUDES OMEGA 3 OILS  ? Fish Oil Anaphylaxis and Swelling  ?  THROAT SWELLS ?INCLUDES FISH AS A CLASS  ? Omega-3 Fatty Acids Swelling  ?  Throat swelling  ?Has tolerated Glucerna nutritional drinks ?  ? Crestor [Rosuvastatin] Other (See Comments)  ?  Extreme joint pain, to this & other statins  ? Gabapentin Anxiety  ?  Anxiety on high doses  ? Lovastatin Other (See Comments)  ?  EXTREME JOINT PAIN  ? Metronidazole Nausea Only and Swelling  ?  Headache and shakes ?Nausea and vomiting  ? Red Yeast Rice  [Cholestin] Other (See Comments)  ?  Muscle pain and severe joint pain.   ? Rotigotine Swelling  ?  (Neupro) Extreme edema ?  ? Atrovent Hfa [Ipratropium Bromide Hfa] Other (See Comments)  ?  wheezing  ? Iodine Swelling  ? Other Other (See Comments)  ?  Surgical Staples causes redness/infection per patient.  ? Pepto-Bismol [Bismuth Subsalicylate] Nausea And Vomiting  ?  Extreme vomiting  ? Victoza [Liraglutide]   ?  Unsure of reaction   ? Enalapril Cough  ? Suprep [Na Sulfate-K Sulfate-Mg Sulf] Nausea And Vomiting  ? Tape Other (See Comments)  ?  Electrodes causes skin breakdown  ? ? ?Outpatient Encounter Medications as of 12/12/2021  ?Medication Sig Note  ? albuterol (PROAIR HFA) 108 (90 Base) MCG/ACT inhaler Inhale 2 puffs into the lungs every 4 (four) hours as needed for wheezing or shortness of breath.   ? albuterol (PROVENTIL) (2.5 MG/3ML) 0.083% nebulizer solution USE 1 VIAL IN NEBULIZER EVERY 4 HOURS AS NEEDED FOR WHEEZING FOR SHORTNESS OF BREATH   ? ascorbic acid (VITAMIN C) 500 MG tablet Take 1 tablet (500 mg total) by mouth 2 (two) times daily.   ? aspirin EC 81 MG tablet Take 1 tablet (81 mg total) by mouth every evening.   ? augmented betamethasone dipropionate (DIPROLENE AF) 0.05 % cream Apply topically 2 (two) times daily.   ? betamethasone dipropionate 0.05 % cream Apply 1 application. topically 2 (two) times daily as needed (extreme itching).   ? collagenase (SANTYL) ointment Apply 1 application topically daily.   ? cyclobenzaprine (FLEXERIL)  10 MG tablet Take 1 tablet (10 mg total) by mouth 3 (three) times daily as needed for muscle spasms. 08/23/2021: Uses tizanidine OR cyclobenzaprine  ? diclofenac Sodium (VOLTAREN) 1 % GEL Apply 2 g topically 4 (four) times daily. 08/09/2021: Reports using as needed.  ? DILT-XR 240 MG 24 hr capsule Take 240 mg by mouth daily.   ? EPINEPHrine (EPIPEN 2-PAK) 0.3 mg/0.3 mL IJ SOAJ injection USE AS DIRECTED FOR SEVERE ALLERGIC REACTION.   ? ezetimibe (ZETIA) 10 MG  tablet Take 1 tablet (10 mg total) by mouth daily.   ? fluconazole (DIFLUCAN) 150 MG tablet Take 1 tablet (150 mg total) by mouth every 3 (three) days.   ? fluticasone (FLONASE) 50 MCG/ACT nasal spray USE 2 SPRAY(S) IN EACH NOSTRIL ONCE DAILY AS NEEDED FOR  ALLERGIES  OR  RHINITIS   ? fluticasone-salmeterol (ADVAIR) 250-50 MCG/ACT AEPB Inhale 1 puff into the lungs in the morning and at bedtime. (Patient not taking: Reported on 12/12/2021)   ? folic acid (FOLVITE) 1 MG tablet Take 1,000 mcg by mouth daily at 12 noon.   ? furosemide (LASIX) 20 MG tablet Take 20-40 mg by mouth 3 (three) times daily as needed (fluid retention/swelling). 08/09/2021: Reports takes 1-2 times a day as needed depending on weight > 5 pounds in a day.  ? gabapentin (NEURONTIN) 400 MG capsule Take 1 capsule by mouth in the morning, 1 capsule at noon, and 2 capsules at bed time.   ? GLUCOMANNAN PO Take 1,995 mg by mouth in the morning, at noon, and at bedtime.   ? glucose blood test strip 1 each by Other route 4 (four) times daily. Contour Next strips   ? HUMULIN R 500 UNIT/ML injection INJECT 0.08-0.3 MLS (40-150 UNITS TOTAL) INTO THE SKIN THREE TIMES DAILY WITH MEALS   ? hydrocortisone cream 1 % Apply 1 application topically 3 (three) times daily as needed for itching (minor skin irritation).   ? hydroxychloroquine (PLAQUENIL) 200 MG tablet Take 1 tablet (200 mg total) by mouth 2 (two) times daily.   ? hydrOXYzine (ATARAX) 10 MG tablet Take 1 tablet (10 mg total) by mouth 3 (three) times daily as needed.   ? INSULIN SYRINGE 1CC/29G (B-D INSULIN SYRINGE) 29G X 1/2" 1 ML MISC Use three times daily with insulin.  DX E11.9   ? isosorbide mononitrate (IMDUR) 30 MG 24 hr tablet Take 1 tablet (30 mg total) by mouth daily. (Patient taking differently: Take 30 mg by mouth at bedtime.)   ? levocetirizine (XYZAL) 5 MG tablet TAKE 1 TABLET BY MOUTH ONCE DAILY IN THE EVENING   ? levothyroxine (SYNTHROID) 200 MCG tablet TAKE 1 TABLET BY MOUTH ONCE DAILY  BEFORE BREAKFAST   ? metFORMIN (GLUCOPHAGE-XR) 500 MG 24 hr tablet Take 2 tablets by mouth twice daily   ? mupirocin ointment (BACTROBAN) 2 % Apply 1 application topically 2 (two) times daily as needed (wound care).   ? naloxone (NARCAN) nasal spray 4 mg/0.1 mL Place 1 spray into the nose as needed (opoid overdose).   ? nitroGLYCERIN (NITROSTAT) 0.4 MG SL tablet DISSOLVE ONE TABLET UNDER THE TONGUE EVERY 5 MINUTES AS NEEDED FOR CHEST PAIN.  DO NOT EXCEED A TOTAL OF 3 DOSES IN 15 MINUTES   ? omeprazole (PRILOSEC) 40 MG capsule Take 1 capsule by mouth once daily   ? Oxycodone HCl 20 MG TABS Take 1 tablet (20 mg total) by mouth every 8 (eight) hours as needed.   ? oxymetazoline (AFRIN) 0.05 %  nasal spray Place 2 sprays into both nostrils 2 (two) times daily as needed.   ? Polyethyl Glycol-Propyl Glycol (SYSTANE) 0.4-0.3 % SOLN Place 1-2 drops into both eyes in the morning.   ? POT BICARB-POT CHLORIDE,25MEQ, 25 MEQ TBEF Take 25 mEq by mouth 3 (three) times daily as needed (with lasix).   ? pramipexole (MIRAPEX) 0.5 MG tablet Take 0.5 mg by mouth 2 (two) times daily.   ? pramipexole (MIRAPEX) 1 MG tablet Take 1 tablet (1 mg total) by mouth See admin instructions. Take 1 tablet (1 mg) by mouth scheduled 3 times daily & take 0.5 tablet (0.5 mg) by mouth twice daily if needed for breakthrough restless leg   ? pregabalin (LYRICA) 25 MG capsule Take 1 capsule by mouth twice daily   ? promethazine (PHENERGAN) 25 MG tablet Take 1 tablet (25 mg total) by mouth every 8 (eight) hours as needed for vomiting or nausea.   ? saccharomyces boulardii (FLORASTOR) 250 MG capsule Take 250 mg by mouth 3 (three) times daily. 08/23/2021: Uses when on antibiotic  ? tiZANidine (ZANAFLEX) 2 MG tablet Take 1 tablet (2 mg total) by mouth 3 (three) times daily.   ? valsartan (DIOVAN) 160 MG tablet Take 1 tablet (160 mg total) by mouth daily.   ? vitamin B-12 (CYANOCOBALAMIN) 1000 MCG tablet Take 1,000 mcg by mouth in the morning.   ? Vitamin D,  Ergocalciferol, (DRISDOL) 1.25 MG (50000 UNIT) CAPS capsule TAKE 1 CAPSULE BY MOUTH ONCE A WEEK (EVERY  7  DAYS)  TAKE  ON  FRIDAY   ? Zinc Sulfate 220 (50 Zn) MG TABS Take 1 tablet (220 mg total) by mouth daily

## 2021-12-13 ENCOUNTER — Other Ambulatory Visit: Payer: Self-pay | Admitting: Family Medicine

## 2021-12-13 DIAGNOSIS — L97901 Non-pressure chronic ulcer of unspecified part of unspecified lower leg limited to breakdown of skin: Secondary | ICD-10-CM

## 2021-12-13 DIAGNOSIS — E669 Obesity, unspecified: Secondary | ICD-10-CM

## 2021-12-13 MED ORDER — SILVER SULFADIAZINE 1 % EX CREA: TOPICAL_CREAM | CUTANEOUS | 3 refills | Status: DC

## 2021-12-15 ENCOUNTER — Other Ambulatory Visit: Payer: Self-pay | Admitting: Family Medicine

## 2021-12-19 ENCOUNTER — Other Ambulatory Visit: Payer: Self-pay | Admitting: Family Medicine

## 2021-12-20 ENCOUNTER — Inpatient Hospital Stay (HOSPITAL_COMMUNITY)
Admission: EM | Admit: 2021-12-20 | Discharge: 2021-12-27 | DRG: 299 | Disposition: A | Discharge status: Home / Self Care | Payer: 59 | Attending: Internal Medicine | Admitting: Internal Medicine

## 2021-12-20 ENCOUNTER — Encounter: Payer: 59 | Attending: Registered Nurse | Admitting: Registered Nurse

## 2021-12-20 ENCOUNTER — Encounter: Payer: Self-pay | Admitting: Registered Nurse

## 2021-12-20 ENCOUNTER — Encounter (HOSPITAL_COMMUNITY): Payer: Self-pay | Admitting: Emergency Medicine

## 2021-12-20 ENCOUNTER — Emergency Department (HOSPITAL_COMMUNITY): Payer: 59

## 2021-12-20 ENCOUNTER — Ambulatory Visit: Payer: 59 | Admitting: Family

## 2021-12-20 VITALS — BP 124/74 | HR 94 | Temp 98.1°F | Ht 62.0 in

## 2021-12-20 DIAGNOSIS — E871 Hypo-osmolality and hyponatremia: Secondary | ICD-10-CM | POA: Diagnosis not present

## 2021-12-20 DIAGNOSIS — F1120 Opioid dependence, uncomplicated: Secondary | ICD-10-CM | POA: Diagnosis present

## 2021-12-20 DIAGNOSIS — I110 Hypertensive heart disease with heart failure: Secondary | ICD-10-CM | POA: Diagnosis present

## 2021-12-20 DIAGNOSIS — L8990 Pressure ulcer of unspecified site, unspecified stage: Secondary | ICD-10-CM | POA: Insufficient documentation

## 2021-12-20 DIAGNOSIS — E1142 Type 2 diabetes mellitus with diabetic polyneuropathy: Secondary | ICD-10-CM | POA: Diagnosis present

## 2021-12-20 DIAGNOSIS — G894 Chronic pain syndrome: Secondary | ICD-10-CM | POA: Diagnosis present

## 2021-12-20 DIAGNOSIS — E11628 Type 2 diabetes mellitus with other skin complications: Secondary | ICD-10-CM | POA: Diagnosis present

## 2021-12-20 DIAGNOSIS — L0390 Cellulitis, unspecified: Secondary | ICD-10-CM | POA: Diagnosis present

## 2021-12-20 DIAGNOSIS — M546 Pain in thoracic spine: Secondary | ICD-10-CM

## 2021-12-20 DIAGNOSIS — M351 Other overlap syndromes: Secondary | ICD-10-CM | POA: Diagnosis present

## 2021-12-20 DIAGNOSIS — I2510 Atherosclerotic heart disease of native coronary artery without angina pectoris: Secondary | ICD-10-CM

## 2021-12-20 DIAGNOSIS — M069 Rheumatoid arthritis, unspecified: Secondary | ICD-10-CM | POA: Diagnosis present

## 2021-12-20 DIAGNOSIS — K219 Gastro-esophageal reflux disease without esophagitis: Secondary | ICD-10-CM | POA: Diagnosis present

## 2021-12-20 DIAGNOSIS — E11649 Type 2 diabetes mellitus with hypoglycemia without coma: Secondary | ICD-10-CM | POA: Diagnosis present

## 2021-12-20 DIAGNOSIS — I5033 Acute on chronic diastolic (congestive) heart failure: Secondary | ICD-10-CM | POA: Diagnosis not present

## 2021-12-20 DIAGNOSIS — Z6841 Body Mass Index (BMI) 40.0 and over, adult: Secondary | ICD-10-CM | POA: Diagnosis not present

## 2021-12-20 DIAGNOSIS — Z91048 Other nonmedicinal substance allergy status: Secondary | ICD-10-CM

## 2021-12-20 DIAGNOSIS — E1151 Type 2 diabetes mellitus with diabetic peripheral angiopathy without gangrene: Principal | ICD-10-CM | POA: Diagnosis present

## 2021-12-20 DIAGNOSIS — L03116 Cellulitis of left lower limb: Secondary | ICD-10-CM | POA: Diagnosis present

## 2021-12-20 DIAGNOSIS — Z79890 Hormone replacement therapy (postmenopausal): Secondary | ICD-10-CM

## 2021-12-20 DIAGNOSIS — M545 Low back pain, unspecified: Secondary | ICD-10-CM | POA: Diagnosis not present

## 2021-12-20 DIAGNOSIS — L89152 Pressure ulcer of sacral region, stage 2: Secondary | ICD-10-CM | POA: Diagnosis present

## 2021-12-20 DIAGNOSIS — Z79899 Other long term (current) drug therapy: Secondary | ICD-10-CM

## 2021-12-20 DIAGNOSIS — E039 Hypothyroidism, unspecified: Secondary | ICD-10-CM | POA: Diagnosis present

## 2021-12-20 DIAGNOSIS — E1169 Type 2 diabetes mellitus with other specified complication: Secondary | ICD-10-CM | POA: Diagnosis present

## 2021-12-20 DIAGNOSIS — Z888 Allergy status to other drugs, medicaments and biological substances status: Secondary | ICD-10-CM

## 2021-12-20 DIAGNOSIS — G546 Phantom limb syndrome with pain: Secondary | ICD-10-CM

## 2021-12-20 DIAGNOSIS — J4530 Mild persistent asthma, uncomplicated: Secondary | ICD-10-CM | POA: Diagnosis present

## 2021-12-20 DIAGNOSIS — Z955 Presence of coronary angioplasty implant and graft: Secondary | ICD-10-CM | POA: Diagnosis not present

## 2021-12-20 DIAGNOSIS — Z8249 Family history of ischemic heart disease and other diseases of the circulatory system: Secondary | ICD-10-CM

## 2021-12-20 DIAGNOSIS — Z91013 Allergy to seafood: Secondary | ICD-10-CM

## 2021-12-20 DIAGNOSIS — M797 Fibromyalgia: Secondary | ICD-10-CM | POA: Diagnosis present

## 2021-12-20 DIAGNOSIS — Z91148 Patient's other noncompliance with medication regimen for other reason: Secondary | ICD-10-CM

## 2021-12-20 DIAGNOSIS — I5032 Chronic diastolic (congestive) heart failure: Secondary | ICD-10-CM | POA: Diagnosis present

## 2021-12-20 DIAGNOSIS — Z7951 Long term (current) use of inhaled steroids: Secondary | ICD-10-CM

## 2021-12-20 DIAGNOSIS — G4730 Sleep apnea, unspecified: Secondary | ICD-10-CM | POA: Diagnosis present

## 2021-12-20 DIAGNOSIS — Z794 Long term (current) use of insulin: Secondary | ICD-10-CM

## 2021-12-20 DIAGNOSIS — Z5181 Encounter for therapeutic drug level monitoring: Secondary | ICD-10-CM

## 2021-12-20 DIAGNOSIS — M79605 Pain in left leg: Secondary | ICD-10-CM | POA: Diagnosis not present

## 2021-12-20 DIAGNOSIS — E872 Acidosis, unspecified: Secondary | ICD-10-CM | POA: Diagnosis present

## 2021-12-20 DIAGNOSIS — Z89611 Acquired absence of right leg above knee: Secondary | ICD-10-CM | POA: Diagnosis not present

## 2021-12-20 DIAGNOSIS — E875 Hyperkalemia: Secondary | ICD-10-CM

## 2021-12-20 DIAGNOSIS — Z7982 Long term (current) use of aspirin: Secondary | ICD-10-CM

## 2021-12-20 DIAGNOSIS — E669 Obesity, unspecified: Secondary | ICD-10-CM | POA: Diagnosis not present

## 2021-12-20 DIAGNOSIS — M2550 Pain in unspecified joint: Secondary | ICD-10-CM

## 2021-12-20 DIAGNOSIS — Z9102 Food additives allergy status: Secondary | ICD-10-CM

## 2021-12-20 DIAGNOSIS — E785 Hyperlipidemia, unspecified: Secondary | ICD-10-CM | POA: Diagnosis present

## 2021-12-20 DIAGNOSIS — K760 Fatty (change of) liver, not elsewhere classified: Secondary | ICD-10-CM | POA: Diagnosis present

## 2021-12-20 DIAGNOSIS — R52 Pain, unspecified: Secondary | ICD-10-CM

## 2021-12-20 DIAGNOSIS — E1165 Type 2 diabetes mellitus with hyperglycemia: Secondary | ICD-10-CM | POA: Diagnosis present

## 2021-12-20 DIAGNOSIS — M109 Gout, unspecified: Secondary | ICD-10-CM | POA: Diagnosis present

## 2021-12-20 DIAGNOSIS — Z825 Family history of asthma and other chronic lower respiratory diseases: Secondary | ICD-10-CM

## 2021-12-20 DIAGNOSIS — G8929 Other chronic pain: Secondary | ICD-10-CM

## 2021-12-20 DIAGNOSIS — I10 Essential (primary) hypertension: Secondary | ICD-10-CM | POA: Diagnosis present

## 2021-12-20 DIAGNOSIS — Z7984 Long term (current) use of oral hypoglycemic drugs: Secondary | ICD-10-CM

## 2021-12-20 DIAGNOSIS — E8809 Other disorders of plasma-protein metabolism, not elsewhere classified: Secondary | ICD-10-CM | POA: Diagnosis present

## 2021-12-20 DIAGNOSIS — D509 Iron deficiency anemia, unspecified: Secondary | ICD-10-CM | POA: Diagnosis present

## 2021-12-20 DIAGNOSIS — E66813 Obesity, class 3: Secondary | ICD-10-CM | POA: Diagnosis present

## 2021-12-20 DIAGNOSIS — L03119 Cellulitis of unspecified part of limb: Secondary | ICD-10-CM

## 2021-12-20 DIAGNOSIS — Z87891 Personal history of nicotine dependence: Secondary | ICD-10-CM

## 2021-12-20 DIAGNOSIS — L89891 Pressure ulcer of other site, stage 1: Secondary | ICD-10-CM

## 2021-12-20 DIAGNOSIS — T501X5A Adverse effect of loop [high-ceiling] diuretics, initial encounter: Secondary | ICD-10-CM | POA: Diagnosis not present

## 2021-12-20 LAB — CBC WITH DIFFERENTIAL/PLATELET
Abs Immature Granulocytes: 0.03 10*3/uL (ref 0.00–0.07)
Basophils Absolute: 0.1 10*3/uL (ref 0.0–0.1)
Basophils Relative: 1 %
Eosinophils Absolute: 0.6 10*3/uL — ABNORMAL HIGH (ref 0.0–0.5)
Eosinophils Relative: 5 %
HCT: 33 % — ABNORMAL LOW (ref 36.0–46.0)
Hemoglobin: 9.1 g/dL — ABNORMAL LOW (ref 12.0–15.0)
Immature Granulocytes: 0 %
Lymphocytes Relative: 29 %
Lymphs Abs: 3.1 10*3/uL (ref 0.7–4.0)
MCH: 20 pg — ABNORMAL LOW (ref 26.0–34.0)
MCHC: 27.6 g/dL — ABNORMAL LOW (ref 30.0–36.0)
MCV: 72.7 fL — ABNORMAL LOW (ref 80.0–100.0)
Monocytes Absolute: 1 10*3/uL (ref 0.1–1.0)
Monocytes Relative: 10 %
Neutro Abs: 5.8 10*3/uL (ref 1.7–7.7)
Neutrophils Relative %: 55 %
Platelets: 310 10*3/uL (ref 150–400)
RBC: 4.54 MIL/uL (ref 3.87–5.11)
RDW: 18.2 % — ABNORMAL HIGH (ref 11.5–15.5)
WBC: 10.7 10*3/uL — ABNORMAL HIGH (ref 4.0–10.5)
nRBC: 0 % (ref 0.0–0.2)

## 2021-12-20 LAB — LACTIC ACID, PLASMA
Lactic Acid, Venous: 2.1 mmol/L (ref 0.5–1.9)
Lactic Acid, Venous: 2.3 mmol/L (ref 0.5–1.9)

## 2021-12-20 LAB — COMPREHENSIVE METABOLIC PANEL
ALT: 24 U/L (ref 0–44)
AST: 26 U/L (ref 15–41)
Albumin: 3.1 g/dL — ABNORMAL LOW (ref 3.5–5.0)
Alkaline Phosphatase: 84 U/L (ref 38–126)
Anion gap: 7 (ref 5–15)
BUN: 18 mg/dL (ref 6–20)
CO2: 29 mmol/L (ref 22–32)
Calcium: 8.8 mg/dL — ABNORMAL LOW (ref 8.9–10.3)
Chloride: 104 mmol/L (ref 98–111)
Creatinine, Ser: 0.93 mg/dL (ref 0.44–1.00)
GFR, Estimated: >60 mL/min (ref >60)
Glucose, Bld: 65 mg/dL — ABNORMAL LOW (ref 70–99)
Potassium: 4.4 mmol/L (ref 3.5–5.1)
Sodium: 140 mmol/L (ref 135–145)
Total Bilirubin: 0.4 mg/dL (ref 0.3–1.2)
Total Protein: 7 g/dL (ref 6.5–8.1)

## 2021-12-20 LAB — CBG MONITORING, ED: Glucose-Capillary: 208 mg/dL — ABNORMAL HIGH (ref 70–99)

## 2021-12-20 MED ORDER — LEVOCETIRIZINE DIHYDROCHLORIDE 5 MG PO TABS: 5.0000 mg | ORAL_TABLET | Freq: Every evening | ORAL | Status: DC

## 2021-12-20 MED ORDER — ALBUTEROL SULFATE (2.5 MG/3ML) 0.083% IN NEBU
2.5000 mg | INHALATION_SOLUTION | RESPIRATORY_TRACT | Status: DC | PRN
Start: 2021-12-20 — End: 2021-12-20

## 2021-12-20 MED ORDER — SODIUM CHLORIDE 0.9 % IV SOLN
2.0000 g | INTRAVENOUS | Status: AC
  Administered 2021-12-21 – 2021-12-27 (×7): 2 g via INTRAVENOUS
  Filled 2021-12-20 (×7): qty 20

## 2021-12-20 MED ORDER — OXYCODONE HCL 20 MG PO TABS: 1.0000 | ORAL_TABLET | Freq: Three times a day (TID) | ORAL | 0 refills | Status: DC | PRN

## 2021-12-20 MED ORDER — METFORMIN HCL ER 500 MG PO TB24
1000.0000 mg | ORAL_TABLET | Freq: Two times a day (BID) | ORAL | Status: DC
  Administered 2021-12-20: 1000 mg via ORAL
  Filled 2021-12-20 (×2): qty 2

## 2021-12-20 MED ORDER — HYDRALAZINE HCL 25 MG PO TABS: 25.0000 mg | ORAL_TABLET | Freq: Four times a day (QID) | ORAL | Status: DC | PRN

## 2021-12-20 MED ORDER — GABAPENTIN 400 MG PO CAPS
800.0000 mg | ORAL_CAPSULE | Freq: Every day | ORAL | Status: DC
  Administered 2021-12-20 – 2021-12-26 (×7): 800 mg via ORAL
  Filled 2021-12-20 (×7): qty 2

## 2021-12-20 MED ORDER — ASPIRIN EC 81 MG PO TBEC
81.0000 mg | DELAYED_RELEASE_TABLET | Freq: Every evening | ORAL | Status: DC
  Administered 2021-12-21 – 2021-12-26 (×6): 81 mg via ORAL
  Filled 2021-12-20 (×6): qty 1

## 2021-12-20 MED ORDER — TIZANIDINE HCL 2 MG PO TABS
2.0000 mg | ORAL_TABLET | Freq: Three times a day (TID) | ORAL | Status: DC
  Administered 2021-12-20 – 2021-12-23 (×10): 2 mg via ORAL
  Filled 2021-12-20 (×10): qty 1

## 2021-12-20 MED ORDER — IRBESARTAN 75 MG PO TABS
150.0000 mg | ORAL_TABLET | Freq: Every day | ORAL | Status: DC
  Administered 2021-12-21 – 2021-12-23 (×3): 150 mg via ORAL
  Filled 2021-12-20: qty 1
  Filled 2021-12-20 (×2): qty 2

## 2021-12-20 MED ORDER — HYDROXYCHLOROQUINE SULFATE 200 MG PO TABS
200.0000 mg | ORAL_TABLET | Freq: Two times a day (BID) | ORAL | Status: DC
  Administered 2021-12-20 – 2021-12-27 (×14): 200 mg via ORAL
  Filled 2021-12-20 (×15): qty 1

## 2021-12-20 MED ORDER — SACCHAROMYCES BOULARDII 250 MG PO CAPS
250.0000 mg | ORAL_CAPSULE | Freq: Three times a day (TID) | ORAL | Status: DC
  Administered 2021-12-20 – 2021-12-27 (×20): 250 mg via ORAL
  Filled 2021-12-20 (×22): qty 1

## 2021-12-20 MED ORDER — ALBUTEROL SULFATE (2.5 MG/3ML) 0.083% IN NEBU
3.0000 mL | INHALATION_SOLUTION | RESPIRATORY_TRACT | Status: DC | PRN
  Administered 2021-12-21 – 2021-12-25 (×4): 3 mL via RESPIRATORY_TRACT
  Filled 2021-12-20 (×4): qty 3

## 2021-12-20 MED ORDER — EZETIMIBE 10 MG PO TABS
10.0000 mg | ORAL_TABLET | Freq: Every day | ORAL | Status: DC
  Administered 2021-12-20 – 2021-12-27 (×8): 10 mg via ORAL
  Filled 2021-12-20 (×8): qty 1

## 2021-12-20 MED ORDER — FLUTICASONE PROPIONATE 50 MCG/ACT NA SUSP
1.0000 | Freq: Every day | NASAL | Status: DC
  Administered 2021-12-21 – 2021-12-27 (×7): 1 via NASAL
  Filled 2021-12-20 (×2): qty 16

## 2021-12-20 MED ORDER — INSULIN ASPART 100 UNIT/ML IJ SOLN
0.0000 [IU] | Freq: Three times a day (TID) | INTRAMUSCULAR | Status: DC
  Administered 2021-12-21: 4 [IU] via SUBCUTANEOUS

## 2021-12-20 MED ORDER — HYDROMORPHONE HCL 1 MG/ML IJ SOLN
1.0000 mg | Freq: Once | INTRAMUSCULAR | Status: AC
  Administered 2021-12-20: 1 mg via INTRAVENOUS
  Filled 2021-12-20: qty 1

## 2021-12-20 MED ORDER — GABAPENTIN 400 MG PO CAPS
400.0000 mg | ORAL_CAPSULE | Freq: Every day | ORAL | Status: DC
  Administered 2021-12-21 – 2021-12-27 (×7): 400 mg via ORAL
  Filled 2021-12-20 (×7): qty 1

## 2021-12-20 MED ORDER — OXYCODONE HCL 5 MG PO TABS
20.0000 mg | ORAL_TABLET | Freq: Three times a day (TID) | ORAL | Status: DC | PRN
  Administered 2021-12-20 – 2021-12-21 (×2): 20 mg via ORAL
  Filled 2021-12-20 (×2): qty 4

## 2021-12-20 MED ORDER — PRAMIPEXOLE DIHYDROCHLORIDE 0.25 MG PO TABS
0.5000 mg | ORAL_TABLET | Freq: Two times a day (BID) | ORAL | Status: DC
  Administered 2021-12-20 – 2021-12-25 (×11): 0.5 mg via ORAL
  Filled 2021-12-20 (×12): qty 2

## 2021-12-20 MED ORDER — ONDANSETRON HCL 4 MG PO TABS
4.0000 mg | ORAL_TABLET | Freq: Four times a day (QID) | ORAL | Status: DC | PRN
Start: 2021-12-20 — End: 2021-12-27

## 2021-12-20 MED ORDER — ENOXAPARIN SODIUM 40 MG/0.4ML IJ SOSY
40.0000 mg | PREFILLED_SYRINGE | INTRAMUSCULAR | Status: DC
  Administered 2021-12-20 – 2021-12-26 (×7): 40 mg via SUBCUTANEOUS
  Filled 2021-12-20 (×7): qty 0.4

## 2021-12-20 MED ORDER — PROMETHAZINE HCL 25 MG PO TABS
25.0000 mg | ORAL_TABLET | Freq: Three times a day (TID) | ORAL | Status: DC | PRN
  Administered 2021-12-23: 25 mg via ORAL
  Filled 2021-12-20: qty 1

## 2021-12-20 MED ORDER — HYDROXYZINE HCL 10 MG PO TABS
10.0000 mg | ORAL_TABLET | Freq: Three times a day (TID) | ORAL | Status: DC | PRN
  Administered 2021-12-22 – 2021-12-25 (×3): 10 mg via ORAL
  Filled 2021-12-20 (×4): qty 1

## 2021-12-20 MED ORDER — FUROSEMIDE 10 MG/ML IJ SOLN
40.0000 mg | Freq: Two times a day (BID) | INTRAMUSCULAR | Status: DC
  Administered 2021-12-20 – 2021-12-24 (×8): 40 mg via INTRAVENOUS
  Filled 2021-12-20 (×8): qty 4

## 2021-12-20 MED ORDER — OXYCODONE HCL 20 MG PO TABS: 1.0000 | ORAL_TABLET | Freq: Three times a day (TID) | ORAL | Status: DC | PRN

## 2021-12-20 MED ORDER — ONDANSETRON HCL 4 MG/2ML IJ SOLN: 4.0000 mg | Freq: Four times a day (QID) | INTRAMUSCULAR | Status: DC | PRN

## 2021-12-20 MED ORDER — GABAPENTIN 300 MG PO CAPS: 1600.0000 mg | ORAL_CAPSULE | ORAL | Status: DC

## 2021-12-20 MED ORDER — POLYVINYL ALCOHOL 1.4 % OP SOLN
1.0000 [drp] | Freq: Every day | OPHTHALMIC | Status: DC
  Administered 2021-12-21: 2 [drp] via OPHTHALMIC
  Administered 2021-12-22: 1 [drp] via OPHTHALMIC
  Administered 2021-12-23 – 2021-12-25 (×3): 2 [drp] via OPHTHALMIC
  Administered 2021-12-26: 1 [drp] via OPHTHALMIC
  Administered 2021-12-27: 2 [drp] via OPHTHALMIC
  Filled 2021-12-20 (×2): qty 15

## 2021-12-20 MED ORDER — OXYMETAZOLINE HCL 0.05 % NA SOLN: 2.0000 | Freq: Two times a day (BID) | NASAL | Status: AC | PRN

## 2021-12-20 MED ORDER — PREGABALIN 25 MG PO CAPS
25.0000 mg | ORAL_CAPSULE | Freq: Two times a day (BID) | ORAL | Status: DC
  Administered 2021-12-20 – 2021-12-27 (×14): 25 mg via ORAL
  Filled 2021-12-20 (×14): qty 1

## 2021-12-20 MED ORDER — LEVOTHYROXINE SODIUM 100 MCG PO TABS
200.0000 ug | ORAL_TABLET | Freq: Every day | ORAL | Status: DC
  Administered 2021-12-21 – 2021-12-26 (×6): 200 ug via ORAL
  Filled 2021-12-20 (×6): qty 2

## 2021-12-20 MED ORDER — MOMETASONE FURO-FORMOTEROL FUM 200-5 MCG/ACT IN AERO
2.0000 | INHALATION_SPRAY | Freq: Two times a day (BID) | RESPIRATORY_TRACT | Status: DC
Start: 2021-12-20 — End: 2021-12-27
  Administered 2021-12-21 – 2021-12-27 (×11): 2 via RESPIRATORY_TRACT
  Filled 2021-12-20 (×2): qty 8.8

## 2021-12-20 MED ORDER — LORATADINE 10 MG PO TABS
10.0000 mg | ORAL_TABLET | Freq: Every evening | ORAL | Status: DC
  Administered 2021-12-20 – 2021-12-26 (×7): 10 mg via ORAL
  Filled 2021-12-20 (×7): qty 1

## 2021-12-20 MED ORDER — PANTOPRAZOLE SODIUM 40 MG PO TBEC
40.0000 mg | DELAYED_RELEASE_TABLET | Freq: Every day | ORAL | Status: DC
  Administered 2021-12-20 – 2021-12-27 (×8): 40 mg via ORAL
  Filled 2021-12-20 (×8): qty 1

## 2021-12-20 MED ORDER — SODIUM CHLORIDE 0.9 % IV SOLN
2.0000 g | Freq: Once | INTRAVENOUS | Status: AC
  Administered 2021-12-20: 2 g via INTRAVENOUS
  Filled 2021-12-20: qty 20

## 2021-12-20 MED ORDER — INSULIN GLARGINE-YFGN 100 UNIT/ML ~~LOC~~ SOLN
15.0000 [IU] | Freq: Every day | SUBCUTANEOUS | Status: DC
  Administered 2021-12-20 – 2021-12-22 (×3): 15 [IU] via SUBCUTANEOUS
  Filled 2021-12-20 (×3): qty 0.15

## 2021-12-20 NOTE — ED Triage Notes (Signed)
Patient sent to ED from PCP for evaluation of cellulitis on leg that started two weeks ago, also reports having multiple pressure sores on buttocks. Patient is alert, oriented, and in no apparent distress at this time. ?

## 2021-12-20 NOTE — ED Provider Triage Note (Signed)
Emergency Medicine Provider Triage Evaluation Note ? ?[name] , a 61 y.o. female  was evaluated in triage.  Pt complains of concern for infection with pain and swelling in her left leg.  She states that its been present for 2 weeks and she has been feeling "crummy" for about 3 weeks. ?She states that she went to her doctor today who told her to she should come to the emergency room.  No fevers. ? ? ?Physical Exam  ?BP 125/61 (BP Location: Left Arm)   Pulse 96   Temp 97.9 ?F (36.6 ?C) (Oral)   Resp 20   SpO2 97%  ?Gen:   Awake, no distress   ?Resp:  Normal effort  ?MSK:   Left leg is swollen and red ?Other:  Left leg skin is weaping.  Sensation intact to light touch to left leg.  Palpable DP pulse left foot.  ? ?Medical Decision Making  ?Medically screening exam initiated at 12:25 PM.  Appropriate orders placed.  [name] was informed that the remainder of the evaluation will be completed by another provider, this initial triage assessment does not replace that evaluation, and the importance of remaining in the ED until their evaluation is complete. ? ? ?  ?Cristina Gong, PA-C ?12/20/21 1227 ? ?

## 2021-12-20 NOTE — ED Notes (Signed)
Leg dressed with non-adherent dressing and roll gaze for weeping ?

## 2021-12-20 NOTE — ED Provider Notes (Addendum)
?[facility] ?Provider Note ? ? ?CSN: [id number] ?Arrival date & time: 12/20/21  1201 ? ?  ? ?History ? ?Chief Complaint  ?Patient presents with  ? Cellulitis  ? ? ?[name] is a 61 y.o. female. ? ?[name] is a 61 year old female with hx of [name], stage 1 decubitus ulcers, right AKA, CAD, PVD, chronic pain who presents from PCP office with concern for cellulitis.  ?Patient reports that she first noticed the wound 2 weeks ago. Wound is red, warm to touch, has been extending up her leg,  she reports clear drainage and swelling of her LLE. She states that she has multiple other wounds on her buttocks and lower abdomen. Patients reports having intermittent wound flares with pain and drainage over the years. She previously was under the care of wound care but has since stopped going. She was being evaluated for multiple stage 1 wounds on her left LE and buttocks. She reports subjective fevers and chills, states that she has been sick for nearly 20 years. Patient reports extreme pain despite taking her daily 20mg  oxycodone TID. She reports dyspnea due to hx of asthma. She denies any recent abx use. Patient uses a walker and wheelchair for mobility. She lives with her husband, [name], who helps her around the home.  ? ?Patient also reports being treated for UTI previously. She states that she is now having urinary retention, urinary urgency with decreased urinary output.  ? ? ?  ? ?Home Medications ?Prior to Admission medications   ?Medication Sig Start Date End Date Taking? Authorizing Provider  ?albuterol (PROAIR HFA) 108 (90 Base) MCG/ACT inhaler Inhale 2 puffs into the lungs every 4 (four) hours as needed for wheezing or shortness of breath. 10/10/21   Shelda Pal, DO  ?albuterol (PROVENTIL) (2.5 MG/3ML) 0.083% nebulizer solution USE 1 VIAL IN NEBULIZER EVERY 4 HOURS AS NEEDED FOR WHEEZING FOR SHORTNESS OF BREATH 08/23/21   Shelda Pal, DO  ?ascorbic  acid (VITAMIN C) 500 MG tablet Take 1 tablet (500 mg total) by mouth 2 (two) times daily. 06/24/21   LoveIvan Anchors, PA-C  ?aspirin EC 81 MG tablet Take 1 tablet (81 mg total) by mouth every evening. 10/17/21   Shelda Pal, DO  ?augmented betamethasone dipropionate (DIPROLENE AF) 0.05 % cream Apply topically 2 (two) times daily. 11/16/21   Shelda Pal, DO  ?betamethasone dipropionate 0.05 % cream Apply 1 application. topically 2 (two) times daily as needed (extreme itching).    [provider]  ?collagenase (SANTYL) ointment Apply 1 application topically daily. 06/24/21   Love, Ivan Anchors, PA-C  ?cyclobenzaprine (FLEXERIL) 10 MG tablet Take 1 tablet (10 mg total) by mouth 3 (three) times daily as needed for muscle spasms. 06/24/21   Love, Ivan Anchors, PA-C  ?diclofenac Sodium (VOLTAREN) 1 % GEL Apply 2 g topically 4 (four) times daily. 06/24/21   Bary Leriche, PA-C  ?DILT-XR 240 MG 24 hr capsule Take 240 mg by mouth daily. 08/24/21   [provider]  ?EPINEPHrine (EPIPEN 2-PAK) 0.3 mg/0.3 mL IJ SOAJ injection USE AS DIRECTED FOR SEVERE ALLERGIC REACTION. 08/24/21   Shelda Pal, DO  ?ezetimibe (ZETIA) 10 MG tablet Take 1 tablet (10 mg total) by mouth daily. 09/21/21   Shelda Pal, DO  ?fluconazole (DIFLUCAN) 150 MG tablet Take 1 tablet (150 mg total) by mouth every 3 (three) days. 08/24/21   Shelda Pal, DO  ?fluticasone Asencion Islam)  50 MCG/ACT nasal spray USE 2 SPRAY(S) IN EACH NOSTRIL ONCE DAILY AS NEEDED FOR ALLERGIES OR  RHINITIS 12/19/21   Shelda Pal, DO  ?fluticasone-salmeterol (ADVAIR) 250-50 MCG/ACT AEPB Inhale 1 puff into the lungs in the morning and at bedtime. 09/22/21   Shelda Pal, DO  ?folic acid (FOLVITE) 1 MG tablet Take 1,000 mcg by mouth daily at 12 noon.    [provider]  ?furosemide (LASIX) 20 MG tablet Take 20-40 mg by mouth 3 (three) times daily as needed (fluid retention/swelling).    [provider]  ?gabapentin (NEURONTIN) 400 MG capsule Take 1 capsule by mouth in the morning, 1 capsule at noon, and 2 capsules at bed time. 08/24/21   Shelda Pal, DO  ?GLUCOMANNAN PO Take 1,995 mg by mouth in the morning, at noon, and at bedtime.    [provider]  ?glucose blood test strip 1 each by Other route 4 (four) times daily. Contour Next strips 06/24/21   Love, Ivan Anchors, PA-C  ?HUMULIN R 500 UNIT/ML injection INJECT 0.08-0.3 MLS (40-150 UNITS TOTAL) INTO THE SKIN THREE TIMES DAILY WITH MEALS 08/23/21   Wendling, Crosby Oyster, DO  ?hydrocortisone cream 1 % Apply 1 application topically 3 (three) times daily as needed for itching (minor skin irritation). 06/24/21   Love, Ivan Anchors, PA-C  ?hydroxychloroquine (PLAQUENIL) 200 MG tablet Take 1 tablet (200 mg total) by mouth 2 (two) times daily. 11/21/21   Shelda Pal, DO  ?hydrOXYzine (ATARAX) 10 MG tablet Take 1 tablet (10 mg total) by mouth 3 (three) times daily as needed. 10/12/21   Suzan Slick, NP  ?INSULIN SYRINGE 1CC/29G (B-D INSULIN SYRINGE) 29G X 1/2" 1 ML MISC Use three times daily with insulin.  DX E11.9 06/11/20   Shelda Pal, DO  ?isosorbide mononitrate (IMDUR) 30 MG 24 hr tablet Take 1 tablet (30 mg total) by mouth daily. ?Patient taking differently: Take 30 mg by mouth at bedtime. 02/02/21   Sueanne Margarita, MD  ?levocetirizine (XYZAL) 5 MG tablet TAKE 1 TABLET BY MOUTH ONCE DAILY IN THE EVENING 11/18/21   Wendling, Crosby Oyster, DO  ?levothyroxine (SYNTHROID) 200 MCG tablet TAKE 1 TABLET BY MOUTH ONCE DAILY BEFORE BREAKFAST 12/19/21   Wendling, Crosby Oyster, DO  ?metFORMIN (GLUCOPHAGE-XR) 500 MG 24 hr tablet Take 2 tablets by mouth twice daily 11/18/21   Wendling, Crosby Oyster, DO  ?mupirocin ointment (BACTROBAN) 2 % Apply 1 application topically 2 (two) times daily as needed (wound care). 11/14/21   Shelda Pal, DO  ?naloxone [facility]) nasal spray 4 mg/0.1 mL Place 1 spray into the nose as  needed (opoid overdose).    [provider]  ?nitroGLYCERIN (NITROSTAT) 0.4 MG SL tablet DISSOLVE ONE TABLET UNDER THE TONGUE EVERY 5 MINUTES AS NEEDED FOR CHEST PAIN.  DO NOT EXCEED A TOTAL OF 3 DOSES IN 15 MINUTES 08/23/21   Sueanne Margarita, MD  ?omeprazole (PRILOSEC) 40 MG capsule Take 1 capsule by mouth once daily 11/18/21   Shelda Pal, DO  ?Oxycodone HCl 20 MG TABS Take 1 tablet (20 mg total) by mouth every 8 (eight) hours as needed. 12/20/21   Bayard Hugger, NP  ?oxymetazoline (AFRIN) 0.05 % nasal spray Place 2 sprays into both nostrils 2 (two) times daily as needed.    [provider]  ?Polyethyl Glycol-Propyl Glycol (SYSTANE) 0.4-0.3 % SOLN Place 1-2 drops into both eyes in the morning.    [provider]  ?POT  BICARB-POT CHLORIDE,25MEQ, 25 MEQ TBEF Take 25 mEq by mouth 3 (three) times daily as needed (with lasix).    [provider]  ?pramipexole (MIRAPEX) 0.5 MG tablet Take 0.5 mg by mouth 2 (two) times daily. 08/23/21   [provider]  ?pramipexole (MIRAPEX) 1 MG tablet TAKE 1 TABLET BY MOUTH THREE TIMES DAILY AND 1/2 (ONE-HALF) TWICE DAILY AS NEEDED FOR  BREAKTHROUGH  RESTLESS  LEG 12/19/21   Wendling, Crosby Oyster, DO  ?pregabalin (LYRICA) 25 MG capsule Take 1 capsule by mouth twice daily 11/18/21   Bayard Hugger, NP  ?promethazine (PHENERGAN) 25 MG tablet Take 1 tablet (25 mg total) by mouth every 8 (eight) hours as needed for vomiting or nausea. 07/05/21   Shelda Pal, DO  ?saccharomyces boulardii (FLORASTOR) 250 MG capsule Take 250 mg by mouth 3 (three) times daily.    [provider]  ?silver sulfADIAZINE (SILVADENE) 1 % cream APPLY  CREAM ONCE DAILY 12/13/21   Shelda Pal, DO  ?tiZANidine (ZANAFLEX) 2 MG tablet Take 1 tablet (2 mg total) by mouth 3 (three) times daily. 09/23/21   Shelda Pal, DO  ?valsartan (DIOVAN) 160 MG tablet Take 1 tablet (160 mg total) by mouth daily. 02/02/21   Sueanne Margarita, MD  ?vitamin B-12 (CYANOCOBALAMIN) 1000 MCG tablet Take 1,000 mcg by mouth in the morning.    [provider]  ?Vitamin D, Ergocalciferol, (DRISDOL) 1.25 MG (50000 UNIT) CAPS capsule TAKE

## 2021-12-20 NOTE — H&P (Signed)
?History and Physical  ? ? ?Layne Bench Keeling [id number] DOB: 09-29-1960 DOA: 12/20/2021 ? ?PCP: Shelda Pal, DO (Confirm with patient/family/NH records and if not entered, this has to be entered at [facility] point of entry) ?Patient coming from: Home ? ?I have personally briefly reviewed patient's old medical records in [place] ? ?Chief Complaint: My leg hurts ? ?HPI: Kaidence Jerilynn Mages Ode is a 61 y.o. female with medical history significant of mild intermittent asthma, chronic diastolic CHF, CAD with stenting x3, PVD status post right AKA, IDDM, HTN, HLD, diabetic neuropathy, chronic mixed connective tissue disease, chronic narcotic dependence, morbid obesity, hypothyroidism, presented with increasing left leg swelling, rash and pain. ? ?Symptoms started 2 weeks ago, with increasing swelling and weeping of the left leg, patient reported that she has been very reluctant to increase her Lasix dosage because of the inconvenience of frequent urination.  Gradually, she developed a rash and pain associated with the left leg.  Last week, she developed several skin openings, and her husband has been doing wound care himself.  She also reported a chronic pressure ulcer on her buttock, which has been draining of the same fluid about 2 to 3 weeks.  Denies any fever or chills. ? ?ED Course: Blood pressure borderline low, afebrile. ? ?WBC 10.7, glucose 65, hemoglobin 9.1 lactic acid 2.1. ? ?Review of Systems: As per HPI otherwise 14 point review of systems negative.  ? ? ?Past Medical History:  ?Diagnosis Date  ? Aortic stenosis   ? mild AS by echo 02/2021  ? Asthma   ? "on daily RX and rescue inhaler" (04/18/2018)  ? Chronic diastolic CHF (congestive heart failure) ([place]) 09/2015  ? Chronic lower back pain   ? Chronic neck pain   ? Chronic pain syndrome   ? Fentanyl Patch  ? Colon polyps   ? Coronary artery disease   ? cath with normal LM, 30% LAD, 85% mid RCA and 95% distal RCA s/p PCI of the mid to distal RCA and now on  DAPT with ASA and Ticagrelor.    ? Eczema   ? Excessive daytime sleepiness 11/26/2015  ? Fibromyalgia   ? Gallstones   ? GERD (gastroesophageal reflux disease)   ? Heart murmur   ? "noted for the 1st time on 04/18/2018"  ? History of blood transfusion 07/2010  ? "S/P oophorectomy"  ? History of gout   ? History of hiatal hernia 1980s  ? "gone now" (04/18/2018)  ? Hyperlipidemia   ? Hypertension   ? takes Metoprolol and Enalapril daily  ? Hypothyroidism   ? takes Synthroid daily  ? IBS (irritable bowel syndrome)   ? Migraine   ? "nothing in the 2000s" (04/18/2018)  ? Mixed connective tissue disease ([place])   ? NAFLD (nonalcoholic fatty liver disease)   ? Pneumonia   ? "several times" (04/18/2018)  ? PVC's (premature ventricular contractions)   ? noted on event monitor 02/2021  ? Rheumatoid arthritis ([place])   ? "hands, elbows, shoulders, probably knees" (04/18/2018)  ? Scoliosis   ? Sleep apnea   ? mild - does not use cpap  ? Spondylosis   ? Type II diabetes mellitus ([place])   ? takes Metformin and Hum R daily (04/18/2018)  ? Walker as ambulation aid   ? also uses wheelchair  ? ? ?Past Surgical History:  ?Procedure Laterality Date  ? ABDOMINAL HYSTERECTOMY  06/2005  ? "w/right ovariy"  ? AMPUTATION Right 01/28/2020  ?  RIGHT FOURTH AND  FIFTH RAY AMPUTATION  ? AMPUTATION Right 01/28/2020  ? Procedure: RIGHT FOURTH AND FIFTH RAY AMPUTATION,;  Surgeon: Newt Minion, MD;  Location: [place];  Service: Orthopedics;  Laterality: Right;  ? AMPUTATION Right 06/01/2021  ? Procedure: RIGHT BELOW KNEE AMPUTATION;  Surgeon: Newt Minion, MD;  Location: [place];  Service: Orthopedics;  Laterality: Right;  ? AMPUTATION Right 07/22/2021  ? Procedure: RIGHT ABOVE KNEE AMPUTATION;  Surgeon: Newt Minion, MD;  Location: [place];  Service: Orthopedics;  Laterality: Right;  ? APPENDECTOMY    ? BREAST BIOPSY Bilateral   ? 9 total (04/18/2018)  ? CORONARY ANGIOPLASTY WITH STENT PLACEMENT  10/01/2015  ? normal LM, 30% LAD, 85% mid RCA and 95% distal RCA s/p PCI  of the mid to distal RCA and now on DAPT with ASA and Ticagrelor.    ? CORONARY STENT INTERVENTION Right 04/18/2018  ? Procedure: CORONARY STENT INTERVENTION;  Surgeon: Burnell Blanks, MD;  Location: [facility];  Service: Cardiovascular;  Laterality: Right;  ? DILATION AND CURETTAGE OF UTERUS    ? FRACTURE SURGERY    ? LAPAROSCOPIC CHOLECYSTECTOMY    ? LEFT HEART CATH AND CORONARY ANGIOGRAPHY N/A 04/18/2018  ? Procedure: LEFT HEART CATH AND CORONARY ANGIOGRAPHY;  Surgeon: Burnell Blanks, MD;  Location: [place] [facility];  Service: Cardiovascular;  Laterality: N/A;  ? MUSCLE BIOPSY Left   ? "leg"  ? OOPHORECTOMY  07/2010  ? RADIOLOGY WITH ANESTHESIA N/A 05/25/2016  ? Procedure: RADIOLOGY WITH ANESTHESIA;  Surgeon: Medication Radiologist, MD;  Location: [place];  Service: Radiology;  Laterality: N/A;  ? STUMP REVISION Right 06/29/2021  ? Procedure: REVISION RIGHT BELOW KNEE AMPUTATION;  Surgeon: Newt Minion, MD;  Location: [place];  Service: Orthopedics;  Laterality: Right;  ? TEE WITHOUT CARDIOVERSION N/A 06/01/2021  ? Procedure: TRANSESOPHAGEAL ECHOCARDIOGRAM (TEE);  Surgeon: Geralynn Rile, MD;  Location: [place];  Service: Cardiovascular;  Laterality: N/A;  ? TONSILLECTOMY AND ADENOIDECTOMY    ? WRIST FRACTURE SURGERY Left   ? "crushed it"  ? ? ? reports that she quit smoking about 17 years ago. Her smoking use included cigarettes. She started smoking about 37 years ago. She has a 19.00 pack-year smoking history. She has been exposed to tobacco smoke. She has never used smokeless tobacco. She reports current alcohol use. She reports that she does not currently use drugs after having used the following drugs: Marijuana. ? ?Allergies  ?Allergen Reactions  ? Fish Allergy Anaphylaxis  ?  INCLUDES OMEGA 3 OILS  ? Fish Oil Anaphylaxis and Swelling  ?  THROAT SWELLS ?INCLUDES FISH AS A CLASS  ? Omega-3 Fatty Acids Swelling  ?  Throat swelling  ?Has tolerated Glucerna nutritional drinks ?  ?  Crestor [Rosuvastatin] Other (See Comments)  ?  Extreme joint pain, to this & other statins  ? Gabapentin Anxiety  ?  Anxiety on high doses  ? Lovastatin Other (See Comments)  ?  EXTREME JOINT PAIN  ? Metronidazole Nausea Only and Swelling  ?  Headache and shakes ?Nausea and vomiting  ? Red Yeast Rice [Cholestin] Other (See Comments)  ?  Muscle pain and severe joint pain.   ? Rotigotine Swelling  ?  (Neupro) Extreme edema ?  ? Atrovent Hfa [Ipratropium Bromide Hfa] Other (See Comments)  ?  wheezing  ? Iodine Swelling  ? Other Other (See Comments)  ?  Surgical Staples causes redness/infection per patient.  ? Pepto-Bismol [Bismuth Subsalicylate] Nausea And  Vomiting  ?  Extreme vomiting  ? Victoza [Liraglutide]   ?  Unsure of reaction   ? Enalapril Cough  ? Suprep [Na Sulfate-K Sulfate-Mg Sulf] Nausea And Vomiting  ? Tape Other (See Comments)  ?  Electrodes causes skin breakdown  ? ? ?Family History  ?Problem Relation Age of Onset  ? CAD Mother   ? Hypertension Mother   ? Heart attack Mother   ? CAD Father   ? Heart attack Father   ? Allergic rhinitis Father   ? Asthma Father   ? Hypertension Brother   ? Hypertension Brother   ? Pancreatic cancer Paternal Aunt   ? Breast cancer Paternal Aunt   ? Lung cancer Paternal Aunt   ? Asthma Son   ? ? ? ?Prior to Admission medications   ?Medication Sig Start Date End Date Taking? Authorizing Provider  ?albuterol (PROAIR HFA) 108 (90 Base) MCG/ACT inhaler Inhale 2 puffs into the lungs every 4 (four) hours as needed for wheezing or shortness of breath. 10/10/21   Shelda Pal, DO  ?albuterol (PROVENTIL) (2.5 MG/3ML) 0.083% nebulizer solution USE 1 VIAL IN NEBULIZER EVERY 4 HOURS AS NEEDED FOR WHEEZING FOR SHORTNESS OF BREATH 08/23/21   Shelda Pal, DO  ?ascorbic acid (VITAMIN C) 500 MG tablet Take 1 tablet (500 mg total) by mouth 2 (two) times daily. 06/24/21   LoveIvan Anchors, PA-C  ?aspirin EC 81 MG tablet Take 1 tablet (81 mg total) by mouth every evening.  10/17/21   Shelda Pal, DO  ?augmented betamethasone dipropionate (DIPROLENE AF) 0.05 % cream Apply topically 2 (two) times daily. 11/16/21   Shelda Pal, DO  ?betamethasone dipropionat

## 2021-12-20 NOTE — ED Notes (Signed)
Pt to US.

## 2021-12-20 NOTE — Progress Notes (Signed)
? ?Subjective:  ? ? Patient ID: [name], female    DOB: [birth year], 60 y.o.   MRN: [id number] ? ?HPI: GUDELIA [name] is a 61 y.o. female who returns for follow up appointment for chronic pain and medication refill. She states her pain is located in her bilateral hands and bilateral feet with tingling and burning, upper- lower back, left lower extremity ( wound)  and generalized joint pain.  She rates her pain 7. Her current exercise regime is walking with a walker in her home short distances other wise she is in her wheelchair. ? ?Ms. Guizar states "she has an old abdominal wound that has opened," site red with no drainage and odor, she also has a left lower extremity wound, she was going to wound care and stopped. Today she has redness and drainage to left lower extremity with edema noted, Ms. [name] states the right lower extremity redness and drainage started two weeks ago, she states she called  her PCP, and spoke to  the nurse who performs wellness check. She states they wanted her to come to office for an appointment  she states.  Ms. Arrell asked for antibiotic, she was encouraged to go to the Emergency Room to be evaluated for cellulitis. Mr. and Mrs Vanderford agreed to go to the ED for evaluation. Mr. Burtnett states he would take her to the emergency room.  ? ?Ms. Strickler Morphine equivalent is 87.14 MME.   Oral Swab was Performed today.  ?  ? ?Pain Inventory ?Average Pain 6 ?Pain Right Now 7 ?My pain is aching ? ?In the last 24 hours, has pain interfered with the following? ?General activity 7 ?Relation with others 7 ?Enjoyment of life 7 ?What TIME of day is your pain at its worst? night ?Sleep (in general) Poor ? ?Pain is worse with: unsure ?Pain improves with: medication ?Relief from Meds: 7 ? ?Family History  ?Problem Relation Age of Onset  ? CAD Mother   ? Hypertension Mother   ? Heart attack Mother   ? CAD Father   ? Heart attack Father   ? Allergic rhinitis Father   ? Asthma Father   ?  Hypertension Brother   ? Hypertension Brother   ? Pancreatic cancer Paternal Aunt   ? Breast cancer Paternal Aunt   ? Lung cancer Paternal Aunt   ? Asthma Son   ? ?Social History  ? ?Socioeconomic History  ? Marital status: Married  ?  Spouse name: [name] Royer  ? Number of children: 1  ? Years of education: 11  ? Highest education level: 12th grade  ?Occupational History  ? Not on file  ?Tobacco Use  ? Smoking status: Former  ?  Packs/day: 1.00  ?  Years: 19.00  ?  Pack years: 19.00  ?  Types: Cigarettes  ?  Start date: 61  ?  Quit date: 02/11/2004  ?  Years since quitting: 17.8  ?  Passive exposure: Past  ? Smokeless tobacco: Never  ?Vaping Use  ? Vaping Use: Never used  ?Substance and Sexual Activity  ? Alcohol use: Yes  ?  Comment: RARE Wine  ? Drug use: Not Currently  ?  Types: Marijuana  ?  Comment: "only in my teens"  ? Sexual activity: Yes  ?  Birth control/protection: Surgical  ?  Comment: Hysterectomy  ?Other Topics Concern  ? Not on file  ?Social History Narrative  ? Not on file  ? ?Social Determinants of Health  ? ?  Financial Resource Strain: High Risk  ? Difficulty of Paying Living Expenses: Very hard  ?Food Insecurity: Food Insecurity Present  ? Worried About Charity fundraiser in the Last Year: Sometimes true  ? Ran Out of Food in the Last Year: Sometimes true  ?Transportation Needs: No Transportation Needs  ? Lack of Transportation (Medical): No  ? Lack of Transportation (Non-Medical): No  ?Physical Activity: Inactive  ? Days of Exercise per Week: 0 days  ? Minutes of Exercise per Session: 0 min  ?Stress: Stress Concern Present  ? Feeling of Stress : Very much  ?Social Connections: Moderately Integrated  ? Frequency of Communication with Friends and Family: More than three times a week  ? Frequency of Social Gatherings with Friends and Family: More than three times a week  ? Attends Religious Services: More than 4 times per year  ? Active Member of Clubs or Organizations: No  ? Attends Theatre manager Meetings: Never  ? Marital Status: Married  ? ?Past Surgical History:  ?Procedure Laterality Date  ? ABDOMINAL HYSTERECTOMY  06/2005  ? "w/right ovariy"  ? AMPUTATION Right 01/28/2020  ?  RIGHT FOURTH AND FIFTH RAY AMPUTATION  ? AMPUTATION Right 01/28/2020  ? Procedure: RIGHT FOURTH AND FIFTH RAY AMPUTATION,;  Surgeon: Newt Minion, MD;  Location: [place];  Service: Orthopedics;  Laterality: Right;  ? AMPUTATION Right 06/01/2021  ? Procedure: RIGHT BELOW KNEE AMPUTATION;  Surgeon: Newt Minion, MD;  Location: [place];  Service: Orthopedics;  Laterality: Right;  ? AMPUTATION Right 07/22/2021  ? Procedure: RIGHT ABOVE KNEE AMPUTATION;  Surgeon: Newt Minion, MD;  Location: [place];  Service: Orthopedics;  Laterality: Right;  ? APPENDECTOMY    ? BREAST BIOPSY Bilateral   ? 9 total (04/18/2018)  ? CORONARY ANGIOPLASTY WITH STENT PLACEMENT  10/01/2015  ? normal LM, 30% LAD, 85% mid RCA and 95% distal RCA s/p PCI of the mid to distal RCA and now on DAPT with ASA and Ticagrelor.    ? CORONARY STENT INTERVENTION Right 04/18/2018  ? Procedure: CORONARY STENT INTERVENTION;  Surgeon: Burnell Blanks, MD;  Location: [facility];  Service: Cardiovascular;  Laterality: Right;  ? DILATION AND CURETTAGE OF UTERUS    ? FRACTURE SURGERY    ? LAPAROSCOPIC CHOLECYSTECTOMY    ? LEFT HEART CATH AND CORONARY ANGIOGRAPHY N/A 04/18/2018  ? Procedure: LEFT HEART CATH AND CORONARY ANGIOGRAPHY;  Surgeon: Burnell Blanks, MD;  Location: [facility];  Service: Cardiovascular;  Laterality: N/A;  ? MUSCLE BIOPSY Left   ? "leg"  ? OOPHORECTOMY  07/2010  ? RADIOLOGY WITH ANESTHESIA N/A 05/25/2016  ? Procedure: RADIOLOGY WITH ANESTHESIA;  Surgeon: Medication Radiologist, MD;  Location: [facility];  Service: Radiology;  Laterality: N/A;  ? STUMP REVISION Right 06/29/2021  ? Procedure: REVISION RIGHT BELOW KNEE AMPUTATION;  Surgeon: Newt Minion, MD;  Location: [place];  Service: Orthopedics;  Laterality: Right;  ? TEE  WITHOUT CARDIOVERSION N/A 06/01/2021  ? Procedure: TRANSESOPHAGEAL ECHOCARDIOGRAM (TEE);  Surgeon: Geralynn Rile, MD;  Location: [place];  Service: Cardiovascular;  Laterality: N/A;  ? TONSILLECTOMY AND ADENOIDECTOMY    ? WRIST FRACTURE SURGERY Left   ? "crushed it"  ? ?Past Surgical History:  ?Procedure Laterality Date  ? ABDOMINAL HYSTERECTOMY  06/2005  ? "w/right ovariy"  ? AMPUTATION Right 01/28/2020  ?  RIGHT FOURTH AND FIFTH RAY AMPUTATION  ? AMPUTATION Right 01/28/2020  ? Procedure: RIGHT FOURTH AND FIFTH  RAY AMPUTATION,;  Surgeon: Newt Minion, MD;  Location: [place];  Service: Orthopedics;  Laterality: Right;  ? AMPUTATION Right 06/01/2021  ? Procedure: RIGHT BELOW KNEE AMPUTATION;  Surgeon: Newt Minion, MD;  Location: [facility];  Service: Orthopedics;  Laterality: Right;  ? AMPUTATION Right 07/22/2021  ? Procedure: RIGHT ABOVE KNEE AMPUTATION;  Surgeon: Newt Minion, MD;  Location: [place];  Service: Orthopedics;  Laterality: Right;  ? APPENDECTOMY    ? BREAST BIOPSY Bilateral   ? 9 total (04/18/2018)  ? CORONARY ANGIOPLASTY WITH STENT PLACEMENT  10/01/2015  ? normal LM, 30% LAD, 85% mid RCA and 95% distal RCA s/p PCI of the mid to distal RCA and now on DAPT with ASA and Ticagrelor.    ? CORONARY STENT INTERVENTION Right 04/18/2018  ? Procedure: CORONARY STENT INTERVENTION;  Surgeon: Burnell Blanks, MD;  Location: [facility];  Service: Cardiovascular;  Laterality: Right;  ? DILATION AND CURETTAGE OF UTERUS    ? FRACTURE SURGERY    ? LAPAROSCOPIC CHOLECYSTECTOMY    ? LEFT HEART CATH AND CORONARY ANGIOGRAPHY N/A 04/18/2018  ? Procedure: LEFT HEART CATH AND CORONARY ANGIOGRAPHY;  Surgeon: Burnell Blanks, MD;  Location: [facility];  Service: Cardiovascular;  Laterality: N/A;  ? MUSCLE BIOPSY Left   ? "leg"  ? OOPHORECTOMY  07/2010  ? RADIOLOGY WITH ANESTHESIA N/A 05/25/2016  ? Procedure: RADIOLOGY WITH ANESTHESIA;  Surgeon: Medication Radiologist, MD;  Location: [facility];  Service:  Radiology;  Laterality: N/A;  ? STUMP REVISION Right 06/29/2021  ? Procedure: REVISION RIGHT BELOW KNEE AMPUTATION;  Surgeon: Newt Minion, MD;  Location: [facility];  Service: Orthopedics;  Laterality: Right;  ? TEE WITH

## 2021-12-20 NOTE — ED Notes (Signed)
Pt back from US

## 2021-12-20 NOTE — Progress Notes (Signed)
ABI study completed.  ° °Please see CV Proc for preliminary results.  ° °Calvin Jablonowski, RDMS, RVT ° °

## 2021-12-21 ENCOUNTER — Other Ambulatory Visit: Payer: Self-pay

## 2021-12-21 ENCOUNTER — Encounter (HOSPITAL_COMMUNITY): Payer: Self-pay | Admitting: Internal Medicine

## 2021-12-21 DIAGNOSIS — I5033 Acute on chronic diastolic (congestive) heart failure: Secondary | ICD-10-CM

## 2021-12-21 DIAGNOSIS — L03116 Cellulitis of left lower limb: Secondary | ICD-10-CM | POA: Diagnosis not present

## 2021-12-21 LAB — BASIC METABOLIC PANEL
Anion gap: 9 (ref 5–15)
BUN: 18 mg/dL (ref 6–20)
CO2: 24 mmol/L (ref 22–32)
Calcium: 8.6 mg/dL — ABNORMAL LOW (ref 8.9–10.3)
Chloride: 101 mmol/L (ref 98–111)
Creatinine, Ser: 0.94 mg/dL (ref 0.44–1.00)
GFR, Estimated: >60 mL/min (ref >60)
Glucose, Bld: 310 mg/dL — ABNORMAL HIGH (ref 70–99)
Potassium: 5.2 mmol/L — ABNORMAL HIGH (ref 3.5–5.1)
Sodium: 134 mmol/L — ABNORMAL LOW (ref 135–145)

## 2021-12-21 LAB — CBG MONITORING, ED
Glucose-Capillary: 197 mg/dL — ABNORMAL HIGH (ref 70–99)
Glucose-Capillary: 204 mg/dL — ABNORMAL HIGH (ref 70–99)

## 2021-12-21 LAB — CBC
HCT: 31.7 % — ABNORMAL LOW (ref 36.0–46.0)
Hemoglobin: 8.7 g/dL — ABNORMAL LOW (ref 12.0–15.0)
MCH: 19.7 pg — ABNORMAL LOW (ref 26.0–34.0)
MCHC: 27.4 g/dL — ABNORMAL LOW (ref 30.0–36.0)
MCV: 71.9 fL — ABNORMAL LOW (ref 80.0–100.0)
Platelets: 306 10*3/uL (ref 150–400)
RBC: 4.41 MIL/uL (ref 3.87–5.11)
RDW: 18.1 % — ABNORMAL HIGH (ref 11.5–15.5)
WBC: 10.3 10*3/uL (ref 4.0–10.5)
nRBC: 0 % (ref 0.0–0.2)

## 2021-12-21 LAB — GLUCOSE, CAPILLARY
Glucose-Capillary: 251 mg/dL — ABNORMAL HIGH (ref 70–99)
Glucose-Capillary: 181 mg/dL — ABNORMAL HIGH (ref 70–99)

## 2021-12-21 LAB — GROUP A STREP BY PCR: Group A Strep by PCR: NOT DETECTED

## 2021-12-21 MED ORDER — MORPHINE SULFATE (PF) 2 MG/ML IV SOLN
1.0000 mg | Freq: Once | INTRAVENOUS | Status: AC | PRN
  Administered 2021-12-21: 1 mg via INTRAVENOUS
  Filled 2021-12-21: qty 1

## 2021-12-21 MED ORDER — INSULIN ASPART 100 UNIT/ML IJ SOLN
0.0000 [IU] | Freq: Every day | INTRAMUSCULAR | Status: DC
  Administered 2021-12-22: 2 [IU] via SUBCUTANEOUS
  Administered 2021-12-23: 0 [IU] via SUBCUTANEOUS
  Administered 2021-12-24: 3 [IU] via SUBCUTANEOUS
  Administered 2021-12-25 – 2021-12-26 (×2): 4 [IU] via SUBCUTANEOUS

## 2021-12-21 MED ORDER — DICLOFENAC SODIUM 1 % EX GEL
4.0000 g | Freq: Four times a day (QID) | CUTANEOUS | Status: DC
  Administered 2021-12-22 – 2021-12-27 (×21): 4 g via TOPICAL
  Filled 2021-12-21 (×2): qty 100

## 2021-12-21 MED ORDER — INSULIN ASPART 100 UNIT/ML IJ SOLN
0.0000 [IU] | Freq: Three times a day (TID) | INTRAMUSCULAR | Status: DC
  Administered 2021-12-21: 11 [IU] via SUBCUTANEOUS
  Administered 2021-12-22: 7 [IU] via SUBCUTANEOUS
  Administered 2021-12-22: 4 [IU] via SUBCUTANEOUS
  Administered 2021-12-22: 7 [IU] via SUBCUTANEOUS
  Administered 2021-12-23: 3 [IU] via SUBCUTANEOUS
  Administered 2021-12-23: 11 [IU] via SUBCUTANEOUS
  Administered 2021-12-23 – 2021-12-24 (×2): 7 [IU] via SUBCUTANEOUS
  Administered 2021-12-24: 4 [IU] via SUBCUTANEOUS
  Administered 2021-12-24 – 2021-12-25 (×2): 7 [IU] via SUBCUTANEOUS
  Administered 2021-12-25: 11 [IU] via SUBCUTANEOUS
  Administered 2021-12-25: 7 [IU] via SUBCUTANEOUS
  Administered 2021-12-26 – 2021-12-27 (×4): 11 [IU] via SUBCUTANEOUS
  Administered 2021-12-27: 20 [IU] via SUBCUTANEOUS

## 2021-12-21 MED ORDER — INSULIN ASPART 100 UNIT/ML IJ SOLN
4.0000 [IU] | Freq: Three times a day (TID) | INTRAMUSCULAR | Status: DC
  Administered 2021-12-21 – 2021-12-24 (×10): 4 [IU] via SUBCUTANEOUS

## 2021-12-21 MED ORDER — OXYCODONE HCL 5 MG PO TABS
30.0000 mg | ORAL_TABLET | Freq: Four times a day (QID) | ORAL | Status: DC | PRN
Start: 2021-12-21 — End: 2021-12-25
  Administered 2021-12-21 – 2021-12-25 (×14): 30 mg via ORAL
  Filled 2021-12-21 (×14): qty 6

## 2021-12-21 NOTE — Progress Notes (Signed)
?Progress Note ? ?Patient: [name] [id number] DOB: [birth year]  ?DOA: 12/20/2021  DOS: 12/21/2021  ?  ?Brief hospital course: ?[name] is a 61 y.o. 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  ? ?She reported several weeks of swelling and weeping in the leg that has worsened despite local wound care by her husband. She was afebrile with leukocytosis (WBC 10.7k), hypoglycemic. She appeared grossly volume overloaded and admits she wasn't taking lasix due to desire to avoid frequent trips to the bathroom. ? ?Assessment and Plan: ?Acute on chronic HFpEF: TTE in Sept 2022 was limited, and will still be limited due to habitus. TEE at that time showed LVEF 60-65%, no wall motion abnormalities. She had Enterococcal bacteremia at that time and no vegetations were noted. Suspect not taking diuretic is primary driver of decompensation, not new event, so will forego repeat echo.  ?- Continue lasix 40mg  IV BID, check daily weights and strict I/O, monitor BMP and electrolytes.  ?  ?Left leg cellulitis: No evidence of purulence at this time.  ?- Continue ceftriaxone ?Petra Kuba of wounds suggestive of venous insufficiency/volume overload complicated by uncontrolled diabetes. 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 Mild PVD may be playing a role but not primary.  ?- Will likely benefit from wound center referral at discharge. ?- Check blood cultures (hx bacteremia, current infection + leukocytosis) ?  ?Pressure ulcer on buttocks: I did not evaluate as the patient is in the hallway of the ED.  ?- Wound care is consulted.  ?  ?HTN ?- Holding  diltiazem with soft BP.  ?- Continued on losartan. Will hold this if BP is soft. ? ?Hyperkalemia: ?dietary ?hemolyzed specimen as renal function appears robust.  ?- Give IV lasix as planned and monitor.  ?  ?IDT2DM: On U500 insulin at home. She's had many different doses of this.  ?- Last HbA1c 7.3% Sept 2022, will recheck ?- Continue glargine insulin + resistant SSI. Add 4u TIDWC.  ?  ?Elevated lactic acid:  ?- Hold metformin.   ?  ?Morbid obesity: Body mass index is 53.22 kg/m?Marland Kitchen and is s/p AKA ?- Tx per PCP advised. ?- Also has hypoalbuminemia and suggestion of nutritional deficiency. Will consult dietitian and check folate, B12, iron panel as below.  ? ?Microcytic anemia: Suspect anemia of chronic disease, perhaps superimposed iron deficiency.  ?- check anemia panel in AM.  ? ?Mixed connective tissue disease ?- Continue hydroxychloroquine, outpatient follow-up with rheumatology (needs to establish with new provider since Dr. Franki Monte moved) ?  ?Narcotic dependence, chronic pain syndrome: Under the care of pain management as outpatient.  ?- Will augment oxycodone dosing while admitted in order to treat acute as well as her chronic pain. Discussed that IV narcotics are not required for most nonsurgical patients that are taking po. ?  ?Hypothyroidism:  ?- continue home supplement. Check TSH to r/o alternative cause of swelling.  ? ?Diabetic neuropathy ?- Continue lyrica and gabapentin. ? ?CAD s/p stenting:  ?- Continue antiplatelet, note allergy to multiple statins ? ?Subjective: Pain in back is chronic, stable, severe, still having uncontrolled pain in left leg around site of  infection as well. Taking po. Urinating. No chest pain currently.  ? ?Objective: ?Vitals:  ? 12/21/21 1036 12/21/21 1426 12/21/21 1500 12/21/21 1523  ?BP: 125/75 130/71 118/81 118/81  ?Pulse: 99 (!) 110 99 99  ?Resp: 20 20 20 20   ?Temp:  98.8 ?F (37.1 ?C) 98.1 ?F (36.7 ?C) 98.1 ?F (36.7 ?C)  ?TempSrc:  Oral Oral Oral  ?SpO2: 98% 98% 97%    ?Height:    5\' 2"  (1.575 m)  ? ?Gen: 61 y.o. female in no distress ?Pulm: Nonlabored tachypnea on room air. Clear, distant ?CV: Regular rate and rhythm. No murmur, rub, or gallop. UTD JVD, 2+ tender pitting LLE dependent edema. ?GI: Abdomen soft, non-tender, non-distended, with normoactive bowel sounds.  ?Ext: Warm, R AKA deformities ?Skin: LLE as pictured in H&P with tender erythema, dressing c/d/i no other/new rashes, lesions or ulcers on visualized skin. ?Neuro: Alert and oriented. No focal neurological deficits. ?Psych: Judgement and insight appear fair. Mood euthymic & affect congruent. Behavior is appropriate.   ? ?Data Personally reviewed: ?CBC: ?Recent Labs  ?Lab 12/20/21 ?1230 12/21/21 ?[id number]  ?WBC 10.7* 10.3  ?NEUTROABS 5.8  --   ?HGB 9.1* 8.7*  ?HCT 33.0* 31.7*  ?MCV 72.7* 71.9*  ?PLT 310 306  ? ?Basic Metabolic Panel: ?Recent Labs  ?Lab 12/20/21 ?1230 12/21/21 ?[id number]  ?NA 140 134*  ?K 4.4 5.2*  ?CL 104 101  ?CO2 29 24  ?GLUCOSE 65* 310*  ?BUN 18 18  ?CREATININE 0.93 0.94  ?CALCIUM 8.8* 8.6*  ? ?GFR: ?CrCl cannot be calculated (Unknown ideal weight.). ?Liver Function Tests: ?Recent Labs  ?Lab 12/20/21 ?1230  ?AST 26  ?ALT 24  ?ALKPHOS 84  ?BILITOT 0.4  ?PROT 7.0  ?ALBUMIN 3.1*  ? ?No results for input(s): LIPASE, AMYLASE in the last 168 hours. ?No results for input(s): AMMONIA in the last 168 hours. ?Coagulation Profile: ?No results for input(s): INR, PROTIME in the last 168 hours. ?Cardiac Enzymes: ?No results for input(s): CKTOTAL, CKMB, CKMBINDEX, TROPONINI in the last 168 hours. ?BNP (last 3 results) ?No results for input(s): PROBNP in the last 8760 hours. ?HbA1C: ?No results for input(s): HGBA1C in the last 72 hours. ?CBG: ?Recent Labs  ?Lab 12/20/21 ?2047 12/21/21 ?0735 12/21/21 ?1432  ?GLUCAP 208* 197* 204*  ? ?Lipid Profile: ?No results for input(s): CHOL, HDL, LDLCALC, TRIG, CHOLHDL, LDLDIRECT in the last 72 hours. ?Thyroid Function Tests: ?No results for input(s): TSH, T4TOTAL, FREET4, T3FREE,  THYROIDAB in the last 72 hours. ?Anemia Panel: ?No results for input(s): VITAMINB12, FOLATE, FERRITIN, TIBC, IRON, RETICCTPCT in the last 72 hours. ?Urine analysis: ?   ?Component Value Date/Time  ? [place] YELLOW 06/21/2021 0855  ? APPEARANCEUR CLOUDY (A) 06/21/2021 0855  ? LABSPEC 1.005 06/21/2021 0855  ? PHURINE 5.0 06/21/2021 0855  ? [place] NEGATIVE 06/21/2021 0855  ? [place] NEGATIVE 06/21/2021 0855  ? [place] NEGATIVE 06/21/2021 0855  ? [place] NEGATIVE 06/21/2021 0855  ? [place] NEGATIVE 06/21/2021 0855  ? UROBILINOGEN 0.2 02/10/2015 1835  ? NITRITE NEGATIVE 06/21/2021 0855  ? LEUKOCYTESUR LARGE (A) 06/21/2021 0855  ? ?Recent Results (from the past 240 hour(s))  ?Group A Strep by PCR     Status: None  ? Collection Time: 12/21/21  3:45 AM  ? Specimen: Throat; Sterile Swab  ?Result Value Ref Range Status  ? Group A Strep by PCR NOT DETECTED NOT DETECTED Final  ?  Comment: Performed at [facility], [place] [address], [place], [place] [address]  ?   ?VAS [place] ABI  WITH/WO TBI ? ?Result Date: 12/21/2021 ? LOWER EXTREMITY DOPPLER STUDY Patient Name:  Calandria Yaklin Kielty  Date of Exam:   12/20/2021 Medical Rec #: [id number]       Accession #:    [id number] Date of Birth: 1961-03-27      Patient Gender: F Patient Age:   84 years Exam Location:  [facility] Procedure:      VAS [place] ABI WITH/WO TBI Referring Phys: Madalyn Rob --------------------------------------------------------------------------------  Indications: Difficult to palpate pulses, pain and skin changes left, and s/p              right AKA.  Comparison Study: 01-03-2018 Prior ABI was within normal range bilaterally Performing Technologist: Darlin Coco RDMS RVT  Examination Guidelines: A complete evaluation includes at minimum, Doppler waveform signals and systolic blood pressure reading at the level of bilateral brachial, anterior tibial, and posterior tibial arteries, when vessel segments are accessible. Bilateral testing is considered  an integral part of a complete examination. Photoelectric Plethysmograph (PPG) waveforms and toe systolic pressure readings are included as required and additional duplex testing as needed. Limited examinations for

## 2021-12-21 NOTE — Evaluation (Signed)
Occupational Therapy Evaluation ?Patient Details ?Name: [name] ?MRN: [id number] ?DOB: 12/31/1960 ?Today's Date: 12/21/2021 ? ? ?History of Present Illness 61 y/o female presented to ED on 12/20/21 for L leg swelling, rash, and pain. Found to have L LE cellulitis. PMH includes asthma, CHF, CAD, HLD, HTN, DM2, obesity, connective tissue disease, chronic pain, R AKA  ? ?Clinical Impression ?  ?Pt admitted for concerns listed above. PTA pt reported that she was mainly WC bound, however she was able to transfer to BSC/recliner/WC with no physical assist using RW. Additionally, pt requiring some assist with BADL's. At this time, pt is very limited by pain, she is requiring min guard- min A for functional mobility and mod A for LB ADL's and stand pivot transfers.  She quickly fatigues and requires frequent rest breaks during session. Recommending HHOT as pt and husband report that they can manage at home, once medically stable. OT will follow acutely.  ?   ? ?Recommendations for follow up therapy are one component of a multi-disciplinary discharge planning process, led by the attending physician.  Recommendations may be updated based on patient status, additional functional criteria and insurance authorization.  ? ?Follow Up Recommendations ? Home health OT  ?  ?Assistance Recommended at Discharge Intermittent Supervision/Assistance  ?Patient can return home with the following A little help with walking and/or transfers;A lot of help with bathing/dressing/bathroom;Assistance with cooking/housework ? ?  ?Functional Status Assessment ? Patient has had a recent decline in their functional status and demonstrates the ability to make significant improvements in function in a reasonable and predictable amount of time.  ?Equipment Recommendations ? None recommended by OT  ?  ?Recommendations for Other Services   ? ? ?  ?Precautions / Restrictions Precautions ?Precautions: Fall ?Precaution Comments: hx of R AKA, sacral  wounds ?Restrictions ?Weight Bearing Restrictions: No  ? ?  ? ?Mobility Bed Mobility ?  ?  ?  ?  ?  ?  ?  ?General bed mobility comments: up in recliner on arrival ?  ? ?Transfers ?Overall transfer level: Needs assistance ?Equipment used: Rolling walker (2 wheels) ?Transfers: Sit to/from Stand ?Sit to Stand: Min guard ?  ?  ?  ?  ?  ?General transfer comment: Min guard to rise ?  ? ?  ?Balance Overall balance assessment: Needs assistance ?Sitting-balance support: No upper extremity supported, Feet supported ?Sitting balance-[name]: Fair ?  ?  ?Standing balance support: Bilateral upper extremity supported, Reliant on assistive device for balance ?Standing balance-[name]: Poor ?Standing balance comment: reliant on RW ?  ?  ?  ?  ?  ?  ?  ?  ?  ?  ?  ?   ? ?ADL either performed or assessed with clinical judgement  ? ?ADL Overall ADL's : Needs assistance/impaired ?Eating/Feeding: Independent;Sitting ?  ?Grooming: Independent;Sitting ?  ?Upper Body Bathing: Independent;Sitting ?  ?Lower Body Bathing: Moderate assistance;Sitting/lateral leans;Sit to/from stand ?  ?Upper Body Dressing : Independent;Sitting ?  ?Lower Body Dressing: Moderate assistance;Sitting/lateral leans;Sit to/from stand ?  ?Toilet Transfer: Moderate assistance;Stand-pivot ?  ?Toileting- Clothing Manipulation and Hygiene: Moderate assistance;Sitting/lateral lean;Sit to/from stand ?  ?  ?  ?Functional mobility during ADLs: Min guard;Rolling walker (2 wheels) ?General ADL Comments: Pt limited by fatigue and increased pain this session. Able to complete most ADL's in sitting, requiring mod A for standing ADLs due to balance deficits  ? ? ? ?Vision Baseline Vision/History: 0 No visual deficits ?Ability to See in Adequate Light: 0 Adequate ?  Patient Visual Report: No change from baseline ?Vision Assessment?: No apparent visual deficits  ?   ?Perception   ?  ?Praxis   ?  ? ?Pertinent Vitals/Pain Pain Assessment ?Pain Assessment: Faces ?Faces Pain  Scale: Hurts a little bit ?Pain Location: L LE and bottom ?Pain Descriptors / Indicators: Grimacing, Discomfort ?Pain Intervention(s): Monitored during session, Repositioned  ? ? ? ?Hand Dominance Right ?  ?Extremity/Trunk Assessment Upper Extremity Assessment ?Upper Extremity Assessment: Overall WFL for tasks assessed ?  ?Lower Extremity Assessment ?Lower Extremity Assessment: Defer to PT evaluation ?  ?Cervical / Trunk Assessment ?Cervical / Trunk Assessment: Kyphotic ?  ?Communication Communication ?Communication: No difficulties ?  ?Cognition Arousal/Alertness: Awake/alert ?Behavior During Therapy: [facility] for tasks assessed/performed ?Overall Cognitive Status: Within Functional Limits for tasks assessed ?  ?  ?  ?  ?  ?  ?  ?  ?  ?  ?  ?  ?  ?  ?  ?  ?  ?  ?  ?General Comments  VSS onRA ? ?  ?Exercises   ?  ?Shoulder Instructions    ? ? ?Home Living Family/patient expects to be discharged to:: Private residence ?Living Arrangements: Spouse/significant other ?Available Help at Discharge: Family;Available 24 hours/day ?Type of Home: House ?Home Access: Ramped entrance ?  ?  ?Home Layout: Two level;Able to live on main level with bedroom/bathroom ?  ?  ?Bathroom Shower/Tub: Tub/shower unit ?  ?Bathroom Toilet: Handicapped height ?Bathroom Accessibility: Yes ?  ?Home Equipment: Tub bench;BSC/3in1;Rolling Walker (2 wheels);Rollator (4 wheels);Wheelchair - manual ?  ?  ?  ? ?  ?Prior Functioning/Environment Prior Level of Function : Needs assist ?  ?  ?  ?  ?  ?  ?Mobility Comments: patient states she performs stand pivot transfer to [facility] when husband not present. Husband doesn't allow her to mobilize beyond that when he's not there. He works 16 hour shifts ?ADLs Comments: requires assistance for some ADLs by husbadn ?  ? ?  ?  ?OT Problem List: Decreased strength;Decreased activity tolerance;Impaired balance (sitting and/or standing);Decreased knowledge of use of DME or AE;Obesity;Pain ?  ?   ?OT Treatment/Interventions:  [name]/ADL training;Therapeutic exercise;Energy conservation;DME and/or AE instruction;Therapeutic activities;Patient/family education;Balance training  ?  ?OT Goals(Current goals can be found in the care plan section) Acute Rehab OT Goals ?Patient Stated Goal: To get stronger ?OT Goal Formulation: With patient ?Time For Goal Achievement: 01/04/22 ?Potential to Achieve Goals: Good ?ADL Goals ?Pt Will Perform Lower Body Bathing: with min guard assist;sitting/lateral leans;sit to/from stand ?Pt Will Perform Lower Body Dressing: with min guard assist;sitting/lateral leans;sit to/from stand ?Pt Will Transfer to Toilet: with min guard assist;stand pivot transfer ?Pt Will Perform Toileting - Clothing Manipulation and hygiene: with min guard assist;sitting/lateral leans;sit to/from stand ?Additional ADL Goal #1: Pt will tolerate standing ADL tasks for 3 mins with no rest break  ?OT Frequency: Min 2X/week ?  ? ?Co-evaluation   ?  ?  ?  ?  ? ?  ?AM-PAC OT "6 Clicks" Daily Activity     ?Outcome Measure Help from another person eating meals?: None ?Help from another person taking care of personal grooming?: None ?Help from another person toileting, which includes using toliet, bedpan, or urinal?: A Lot ?Help from another person bathing (including washing, rinsing, drying)?: A Lot ?Help from another person to put on and taking off regular upper body clothing?: None ?Help from another person to put on and taking off regular lower body clothing?: A Lot ?6 Click  Score: 18 ?  ?End of Session Equipment Utilized During Treatment: Rolling walker (2 wheels) ?Nurse Communication: Mobility status ? ?Activity Tolerance: Patient tolerated treatment well ?Patient left: in chair;with call bell/phone within reach;with family/visitor present ? ?OT Visit Diagnosis: Unsteadiness on feet (R26.81);Other abnormalities of gait and mobility (R26.89);Muscle weakness (generalized) (M62.81)  ?              ?Time: 1610-9604 ?OT Time Calculation (min):  25 min ?Charges:  OT General Charges ?$OT Visit: 1 Visit ?OT Evaluation ?$OT Eval Moderate Complexity: 1 Mod ?OT Treatments ?$Self Care/Home Management : 8-22 mins ? ?Karel Mowers H., OTR/L ?Acute Rehabilitation ? ?

## 2021-12-21 NOTE — Evaluation (Signed)
Physical Therapy Evaluation ?Patient Details ?Name: [name] ?MRN: [id number] ?DOB: 1961-08-02 ?Today's Date: 12/21/2021 ? ?History of Present Illness ? 61 y/o female presented to ED on 12/20/21 for L leg swelling, rash, and pain. Found to have L LE cellulitis. PMH includes asthma, CHF, CAD, HLD, HTN, DM2, obesity, connective tissue disease, chronic pain, R AKA  ?Clinical Impression ? Patient admitted with the above. Patient presents with generalized weakness, impaired balance, decreased activity tolerance, and pain. Patient required minA for sit to stand from low surface and min guard for short ambulation distance. Patient does not have prosthetic due to recent wounds on R residual limb, hopeful for prosthetic in next couple of months. Patient will benefit from skilled PT services during acute stay to address listed deficits. Recommend HHPT at discharge to maximize functional mobility and address R LE strength.    ?   ? ?Recommendations for follow up therapy are one component of a multi-disciplinary discharge planning process, led by the attending physician.  Recommendations may be updated based on patient status, additional functional criteria and insurance authorization. ? ?Follow Up Recommendations Home health PT ? ?  ?Assistance Recommended at Discharge Intermittent Supervision/Assistance  ?Patient can return home with the following ? A little help with walking and/or transfers;A little help with bathing/dressing/bathroom;Assistance with cooking/housework ? ?  ?Equipment Recommendations None recommended by PT  ?Recommendations for Other Services ?    ?  ?Functional Status Assessment Patient has had a recent decline in their functional status and demonstrates the ability to make significant improvements in function in a reasonable and predictable amount of time.  ? ?  ?Precautions / Restrictions Precautions ?Precautions: Fall ?Precaution Comments: hx of R AKA, sacral wounds ?Restrictions ?Weight Bearing  Restrictions: No  ? ?  ? ?Mobility ? Bed Mobility ?  ?  ?  ?  ?  ?  ?  ?General bed mobility comments: up in recliner on arrival ?  ? ?Transfers ?Overall transfer level: Needs assistance ?Equipment used: Rolling Gurjit Loconte (2 wheels) ?Transfers: Sit to/from Stand ?Sit to Stand: Min assist ?  ?  ?  ?  ?  ?General transfer comment: minA to rise and steady. Patient with difficulty transitioning R hand to Aundraya Dripps upon standing. Patient states she normally pulls up on RW rather than pushing on arms of chair ?  ? ?Ambulation/Gait ?Ambulation/Gait assistance: Min guard ?Gait Distance (Feet): 6 Feet ?Assistive device: Rolling Maelie Chriswell (2 wheels) ?Gait Pattern/deviations:  (hop to) ?Gait velocity: decreased ?  ?  ?General Gait Details: min guard for safety. Patient quickly fatigued but reports she had been getting up/down to bathroom all morning. At baseline, only mobilizes short distances ? ?Stairs ?  ?  ?  ?  ?  ? ?Wheelchair Mobility ?  ? ?Modified Rankin (Stroke Patients Only) ?  ? ?  ? ?Balance Overall balance assessment: Needs assistance ?Sitting-balance support: No upper extremity supported, Feet supported ?Sitting balance-[name]: Fair ?  ?  ?Standing balance support: Bilateral upper extremity supported, Reliant on assistive device for balance ?Standing balance-[name]: Poor ?Standing balance comment: reliant on RW ?  ?  ?  ?  ?  ?  ?  ?  ?  ?  ?  ?   ? ? ? ?Pertinent Vitals/Pain Pain Assessment ?Pain Assessment: Faces ?Faces Pain Scale: Hurts a little bit ?Pain Location: L LE ?Pain Descriptors / Indicators: Grimacing ?Pain Intervention(s): Monitored during session  ? ? ?Home Living Family/patient expects to be discharged to:: Private residence ?Living  Arrangements: Spouse/significant other ?Available Help at Discharge: Family;Available 24 hours/day ?Type of Home: House ?Home Access: Ramped entrance ?  ?  ?  ?Home Layout: Two level;Able to live on main level with bedroom/bathroom ?Home Equipment: Tub  bench;BSC/3in1;Rolling Akesha Uresti (2 wheels);Rollator (4 wheels);Wheelchair - manual ?   ?  ?Prior Function Prior Level of Function : Needs assist ?  ?  ?  ?  ?  ?  ?Mobility Comments: patient states she performs stand pivot transfer to [facility] when husband not present. Husband doesn't allow her to mobilize beyond that when he's not there. He works 16 hour shifts ?ADLs Comments: requires assistance for some ADLs by husbadn ?  ? ? ?Hand Dominance  ?   ? ?  ?Extremity/Trunk Assessment  ? Upper Extremity Assessment ?Upper Extremity Assessment: Defer to OT evaluation ?  ? ?Lower Extremity Assessment ?Lower Extremity Assessment: Generalized weakness ?  ? ?Cervical / Trunk Assessment ?Cervical / Trunk Assessment: Kyphotic  ?Communication  ? Communication: No difficulties  ?Cognition Arousal/Alertness: Awake/alert ?Behavior During Therapy: [facility] for tasks assessed/performed ?Overall Cognitive Status: Within Functional Limits for tasks assessed ?  ?  ?  ?  ?  ?  ?  ?  ?  ?  ?  ?  ?  ?  ?  ?  ?  ?  ?  ? ?  ?General Comments General comments (skin integrity, edema, etc.): VSS on RA ? ?  ?Exercises    ? ?Assessment/Plan  ?  ?PT Assessment Patient needs continued PT services  ?PT Problem List Decreased strength;Decreased activity tolerance;Decreased balance;Decreased mobility ? ?   ?  ?PT Treatment Interventions DME instruction;Gait training;Functional mobility training;Therapeutic activities;Therapeutic exercise;Balance training;Patient/family education   ? ?PT Goals (Current goals can be found in the Care Plan section)  ?Acute Rehab PT Goals ?Patient Stated Goal: to go home ?PT Goal Formulation: With patient ?Time For Goal Achievement: 01/04/22 ?Potential to Achieve Goals: Good ? ?  ?Frequency Min 3X/week ?  ? ? ?Co-evaluation   ?  ?  ?  ?  ? ? ?  ?AM-PAC PT "6 Clicks" Mobility  ?Outcome Measure Help needed turning from your back to your side while in a flat bed without using bedrails?: A Little ?Help needed moving from lying on your  back to sitting on the side of a flat bed without using bedrails?: A Little ?Help needed moving to and from a bed to a chair (including a wheelchair)?: A Little ?Help needed standing up from a chair using your arms (e.g., wheelchair or bedside chair)?: A Little ?Help needed to walk in hospital room?: A Little ?Help needed climbing 3-5 steps with a railing? : Total ?6 Click Score: 16 ? ?  ?End of Session Equipment Utilized During Treatment: Gait belt ?Activity Tolerance: Patient tolerated treatment well ?Patient left: in chair;with call bell/phone within reach ?Nurse Communication: Mobility status ?PT Visit Diagnosis: Unsteadiness on feet (R26.81);Muscle weakness (generalized) (M62.81);Other abnormalities of gait and mobility (R26.89) ?  ? ?Time: [id number] ?PT Time Calculation (min) (ACUTE ONLY): 27 min ? ? ?Charges:   PT Evaluation ?$PT Eval Moderate Complexity: 1 Mod ?PT Treatments ?$Therapeutic Activity: 8-22 mins ?  ?   ? ? ?Eryanna Regal A. Gilford Rile, PT, DPT ?Acute Rehabilitation Services ?Pager [phone] ?Office [phone] ? ? ?Kendale Rembold A Orlen Leedy ?12/21/2021, 4:54 PM ? ?

## 2021-12-21 NOTE — ED Notes (Signed)
Pt ambulated to the BR x4 during night shift. Pt had about 600-700 output ?

## 2021-12-21 NOTE — Progress Notes (Signed)
Inpatient Diabetes Program Recommendations ? ?AACE/ADA: New Consensus Statement on Inpatient Glycemic Control  ? ?Target Ranges:  Prepandial:   less than 140 mg/dL ?     Peak postprandial:   less than 180 mg/dL (1-2 hours) ?     Critically ill patients:  140 - 180 mg/dL  ? ? Latest Reference Range & Units 12/20/21 20:47 12/21/21 07:35  ?Glucose-Capillary 70 - 99 mg/dL 102 (H) 585 (H)  ?(H): Data is abnormally high ? ?Review of Glycemic Control ? ?Diabetes history: DM2 ?Outpatient Diabetes medications: Humulin R U500 40-150 units TID with meals, Metformin XR 500 mg BID ?Current orders for Inpatient glycemic control: Semglee 15 units daily, Novolog 0-20 units TID with meals ? ?Inpatient Diabetes Program Recommendations:   ? ?Insulin: Please consider ordering Novolog 0-5 units QHS. If post prandial glucose is consistently over 180 mg/dl, please consider ordering Novolog 5 units TID with meals for meal coverage if patient eats at least 50% of meals. ? ?Thanks, ?Orlando Penner, RN, MSN, CDE ?Diabetes Coordinator ?Inpatient Diabetes Program ?[phone] (Team Pager from 8am to 5pm) ? ? ? ?

## 2021-12-21 NOTE — ED Notes (Signed)
Pt dressing changed until wound care can see patient. Sacrum foam dressing applied to buttock for patient comfort. ?

## 2021-12-22 DIAGNOSIS — I5032 Chronic diastolic (congestive) heart failure: Secondary | ICD-10-CM | POA: Diagnosis present

## 2021-12-22 DIAGNOSIS — I5033 Acute on chronic diastolic (congestive) heart failure: Secondary | ICD-10-CM | POA: Diagnosis present

## 2021-12-22 DIAGNOSIS — L8990 Pressure ulcer of unspecified site, unspecified stage: Secondary | ICD-10-CM | POA: Insufficient documentation

## 2021-12-22 DIAGNOSIS — E875 Hyperkalemia: Secondary | ICD-10-CM

## 2021-12-22 LAB — FOLATE: Folate: 48 ng/mL (ref >5.9)

## 2021-12-22 LAB — BASIC METABOLIC PANEL
Anion gap: 7 (ref 5–15)
BUN: 14 mg/dL (ref 6–20)
CO2: 29 mmol/L (ref 22–32)
Calcium: 8.6 mg/dL — ABNORMAL LOW (ref 8.9–10.3)
Chloride: 99 mmol/L (ref 98–111)
Creatinine, Ser: 0.8 mg/dL (ref 0.44–1.00)
GFR, Estimated: >60 mL/min (ref >60)
Glucose, Bld: 221 mg/dL — ABNORMAL HIGH (ref 70–99)
Potassium: 4.3 mmol/L (ref 3.5–5.1)
Sodium: 135 mmol/L (ref 135–145)

## 2021-12-22 LAB — CBC
HCT: 30 % — ABNORMAL LOW (ref 36.0–46.0)
Hemoglobin: 8.6 g/dL — ABNORMAL LOW (ref 12.0–15.0)
MCH: 20 pg — ABNORMAL LOW (ref 26.0–34.0)
MCHC: 28.7 g/dL — ABNORMAL LOW (ref 30.0–36.0)
MCV: 69.8 fL — ABNORMAL LOW (ref 80.0–100.0)
Platelets: 302 10*3/uL (ref 150–400)
RBC: 4.3 MIL/uL (ref 3.87–5.11)
RDW: 17.7 % — ABNORMAL HIGH (ref 11.5–15.5)
WBC: 8.7 10*3/uL (ref 4.0–10.5)
nRBC: 0 % (ref 0.0–0.2)

## 2021-12-22 LAB — VITAMIN B12: Vitamin B-12: 1150 pg/mL — ABNORMAL HIGH (ref 180–914)

## 2021-12-22 LAB — IRON AND TIBC
Iron: 24 ug/dL — ABNORMAL LOW (ref 28–170)
Saturation Ratios: 5 % — ABNORMAL LOW (ref 10.4–31.8)
TIBC: 462 ug/dL — ABNORMAL HIGH (ref 250–450)
UIBC: 438 ug/dL

## 2021-12-22 LAB — GLUCOSE, CAPILLARY
Glucose-Capillary: 243 mg/dL — ABNORMAL HIGH (ref 70–99)
Glucose-Capillary: 161 mg/dL — ABNORMAL HIGH (ref 70–99)
Glucose-Capillary: 213 mg/dL — ABNORMAL HIGH (ref 70–99)
Glucose-Capillary: 227 mg/dL — ABNORMAL HIGH (ref 70–99)

## 2021-12-22 LAB — FERRITIN: Ferritin: 7 ng/mL — ABNORMAL LOW (ref 11–307)

## 2021-12-22 LAB — TSH: TSH: 9.386 u[IU]/mL — ABNORMAL HIGH (ref 0.350–4.500)

## 2021-12-22 LAB — HEMOGLOBIN A1C
Hgb A1c MFr Bld: 8.5 % — ABNORMAL HIGH (ref 4.8–5.6)
Mean Plasma Glucose: 197.25 mg/dL

## 2021-12-22 LAB — BRAIN NATRIURETIC PEPTIDE: B Natriuretic Peptide: 7.8 pg/mL (ref 0.0–100.0)

## 2021-12-22 MED ORDER — INSULIN GLARGINE-YFGN 100 UNIT/ML ~~LOC~~ SOLN
30.0000 [IU] | Freq: Every day | SUBCUTANEOUS | Status: AC
  Administered 2021-12-23: 30 [IU] via SUBCUTANEOUS
  Filled 2021-12-22: qty 0.3

## 2021-12-22 MED ORDER — ADULT MULTIVITAMIN W/MINERALS CH
1.0000 | ORAL_TABLET | Freq: Every day | ORAL | Status: DC
  Administered 2021-12-22 – 2021-12-27 (×6): 1 via ORAL
  Filled 2021-12-22 (×6): qty 1

## 2021-12-22 MED ORDER — ENSURE MAX PROTEIN PO LIQD
11.0000 [oz_av] | Freq: Two times a day (BID) | ORAL | Status: DC
  Administered 2021-12-22 – 2021-12-27 (×9): 11 [oz_av] via ORAL
  Filled 2021-12-22 (×13): qty 330

## 2021-12-22 MED ORDER — INSULIN GLARGINE-YFGN 100 UNIT/ML ~~LOC~~ SOLN
10.0000 [IU] | SUBCUTANEOUS | Status: AC
  Administered 2021-12-22: 10 [IU] via SUBCUTANEOUS
  Filled 2021-12-22: qty 0.1

## 2021-12-22 NOTE — Hospital Course (Addendum)
[name] was admitted to the hospital with the working diagnosis of left leg cellulitis complicated with heart failure.  ? ?61 y.o. 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  Reportedly she has not been taking Lasix due to desire to avoid frequent trips to the bathroom. On her initial physical examination her blood pressure was 125/61, HR 96, RR 20, 02 97%, lungs with no rales or wheezing, heart with S1 and S2 present and rhythmic, abdomen soft and not tender. Left leg with tender erythema, multiple skin ulcers, positive bilateral edema ++.  ? ?Na 140, K 4,4 CL 104, bicarbonate 29, glucose 65, bun 18 cr 0,93 ?Lactic acid 2,1  ?Wbc 10.7 hgb 9.1 plt 310  ? ?Patient was placed on antibiotic therapy and resumed on diuretics.  ? ?

## 2021-12-22 NOTE — Progress Notes (Signed)
?PROGRESS NOTE ?[name]  [id number] DOB: 1960/09/12 DOA: 12/20/2021 ?PCP: Shelda Pal, DO  ? ?Brief Narrative/Hospital Course: ?61 y.o. 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  Reportedly she has not been taking Lasix due to desire to avoid frequent trips to the bathroom. Seen in the ED -afebrile with leukocytosis (WBC 10.7k), hypoglycemic, appeared grossly volume overloaded and admitted.  She is being managed for acute on chronic HF PEF with left leg cellulitis pressure ulcers hyperkalemia, lactic acidosis.  ?  ?Subjective: ?Seen and examined this morning.  She is on the bedside chair, complains of dressing being no changes yet, requesting for breathing treatment, also asked me to call the dietary department as they sent her oatmeal too-of note she is on diabetic diet. ?Overnight afebrile, doing well on room air ? ?Assessment and Plan: ?Principal Problem: ?  Acute on chronic diastolic CHF (congestive heart failure) ([name]) ?Active Problems: ?  Hypothyroidism ?  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[name]) ?  Diabetic polyneuropathy ([name]) ?  Diabetes mellitus type 2 in obese [name]) ?  Mild persistent asthma ?  NAFLD (nonalcoholic fatty liver disease) ?  Essential hypertension ?  CAD S/P percutaneous coronary angioplasty ?  Hx of AKA (above knee amputation), right ([name]) ?  Left leg cellulitis ?  Cellulitis ?  Pressure injury of skin ?  Hyperkalemia ?  ?Acute on chronic diastolic CHF: ?Having good urine output as below, will continue on current IV Lasix 40 twice daily,, monitor intake output Daily weight. ?Net IO Since  Admission: -4,630 mL [12/22/21 1124]  ? ?Intake/Output Summary (Last 24 hours) at 12/22/2021 1124 ?Last data filed at 12/22/2021 1116 ?Gross per 24 hour  ?Intake 520 ml  ?Output 5350 ml  ?Net -4830 ml  ?  ?Left leg cellulitis: In the setting of venous stasis with CHF.  Has history of PVD and nonhealing wounds leading to sequential right lower extremity amputation. ABI here is 0.94 (was 0.96 in 2019) and TBI is normal at 0.78 (was 0.73  Mild PVD may be playing a role.  Continue diuretics as above, continue antibiotics, wound care and dressing changes while here.  Blood culture from admission pending. ? ?Hypothyroidism: TSH elevated 634, continue Synthroid ? ?Connective tissue disease overlap syndrome: Continue Plaquenil, Mirapex, Zanaflex ? ?Diabetes mellitus type 2 in obese on long-term insulin with complication  ?Diabetic polyneuropathy: ?Blood sugar poorly controlled in the setting of infection, moderate outpatient control with A1c  8.5. ?On humulin R U 500 40-150 units TID, metformin XR 500 twice daily at home. increase Semglee to 30 units, Premeal 4 units and resistant scale insulin.  Continue Neurontin for help with neuropathy.  Appreciate DM coordinator follow-up on board ?Recent Labs  ?Lab 12/21/21 ?0735 12/21/21 ?1432 12/21/21 ?1701 12/21/21 ?2048 12/22/21 ?0304 12/22/21 ?0759  ?GLUCAP 197* 204* 251* 181*  --  243*  ?HGBA1C  --   --   --   --  8.5*  --   ?  ?Mild persistent asthma: Continue Dulera, as needed albuterol nebulizer ? ?NAFLD-monitor LFTs encourage weight loss ? ?Essential hypertension ?CAD S/P percutaneous coronary angioplasty ?HLD: ?BP is well controlled.  No  active chest pain.  Continue aspirin 81, Zetia 10 mg, Avapro ? ?Hx of AKA (above knee amputation), right-supportive care  ? ?Hyperkalemia: Resolved ?Recent Labs  ?Lab 12/20/21 ?1230 12/21/21 ?[id number] 12/22/21 ?0304  ?K 4.4 5.2* 4.3  ?  ?Morbid Obesity:Patient's Body mass index is 53.22 kg/m?. : Will benefit with PCP follow-up, weight loss   healthy lifestyle and outpatient sleep evaluation. ? ?Pressure injury stage II on sacrum POA see below ?Pressure Injury 12/21/21 Sacrum Mid Stage 2 -  Partial thickness loss of dermis presenting as a shallow open injury with a red, pink wound bed without slough. (Active)  ?12/21/21 1900  ?Location: Sacrum  ?Location Orientation: Mid  ?Staging: Stage 2 -  Partial thickness loss of dermis presenting as a shallow open injury with a red, pink wound bed without slough.  ?Wound Description (Comments):   ?Present on Admission: Yes  ?   ?Pressure Injury 12/21/21 Sacrum Lower;Mid Stage 2 -  Partial thickness loss of dermis presenting as a shallow open injury with a red, pink wound bed without slough. (Active)  ?12/21/21 1500  ?Location: Sacrum  ?Location Orientation: Lower;Mid  ?Staging: Stage 2 -  Partial thickness loss of dermis presenting as a shallow open injury with a red, pink wound bed without slough.  ?Wound Description (Comments):   ?Present on Admission: Yes  ? ? ?DVT prophylaxis: enoxaparin (LOVENOX) injection 40 mg Start: 12/20/21 2200 ?Code Status:   Code Status: Full Code ?Family Communication: plan of care discussed with patient at bedside. ?Patient status is: Inpatient level of care: Med-Surg  ?Remains inpatient because: Ongoing management of fluid overload CHF and left leg cellulitis. ?Patient currently not stable ? ?Dispo: The patient is from: Home, current activity level 3 stands with assistance. ?           Anticipated disposition: Plan to discharge home in next 2 days ? ?Mobility Assessment (last 72 hours)   ? ? Mobility Assessment   ? ? Alvan Name 12/21/21 1700 12/21/21 1647 12/21/21 1500  ?  ?  ? Does patient have an order for bedrest or is patient medically unstable -- -- No - Continue assessment    ? What is the highest level of mobility based on the progressive mobility assessment? Level 3 (Stands with assist) - Balance while standing  and cannot march in place Level 3 (Stands with assist) - Balance  while standing  and cannot march in place Level 3 (Stands with assist) - Balance while standing  and cannot march in place    ? Is the above level different from baseline mobility prior to current illness? -- -- Yes - Recommend PT order    ? ?  ?  ? ?  ?  ? ?Objective: ?Vitals last 24 hrs: ?Vitals:  ? 12/21/21 1523 12/21/21 1736 12/21/21 2100 12/22/21 0431  ?BP: 118/81 (!) 120/93 132/77 (!) 141/67  ?Pulse: 99 (!) 102 99 92  ?Resp: 20 16 16 18   ?Temp: 98.1 ?F (36.7 ?C) 97.9 ?F (36.6 ?C) 98.1 ?F (36.7 ?C) 98.2 ?F (36.8 ?C)  ?TempSrc: Oral Oral Oral Oral  ?SpO2:  96% 96% 94%  ?Height: 5\' 2"  (1.575 m)     ? ?Weight change:  ? ?Physical Examination: ?General exam: AA OX3,older than stated age, weak appearing. ?HEENT:Oral mucosa moist, Ear/Nose WNL grossly, dentition normal. ?Respiratory system: bilaterally diminished BS, no use of accessory muscle ?Cardiovascular system: S1 & S2 +, No JVD,. ?Gastrointestinal system: Abdomen soft,NT,ND, BS+ ?Nervous System:Alert, awake, moving extremities and grossly nonfocal ?  Extremities: Lt LE edema with dressing in place, rt aka ,distal peripheral pulses palpable.  ?Skin: No rashes,no icterus. ?MSK: Normal muscle bulk,tone, power ? ?Medications reviewed:  ?Scheduled Meds: ? aspirin EC  81 mg Oral QPM  ? diclofenac Sodium  4 g Topical QID  ? enoxaparin (LOVENOX) injection  40 mg Subcutaneous Q24H  ? ezetimibe  10 mg Oral Daily  ? fluticasone  1 spray Each Nare Daily  ? furosemide  40 mg Intravenous BID  ? gabapentin  400 mg Oral Daily  ? And  ? gabapentin  800 mg Oral QHS  ? hydroxychloroquine  200 mg Oral BID  ? insulin aspart  0-20 Units Subcutaneous TID WC  ? insulin aspart  0-5 Units Subcutaneous QHS  ? insulin aspart  4 Units Subcutaneous TID WC  ? insulin glargine-yfgn  15 Units Subcutaneous Daily  ? irbesartan  150 mg Oral Daily  ? levothyroxine  200 mcg Oral QAC breakfast  ? loratadine  10 mg Oral QPM  ? mometasone-formoterol  2 puff Inhalation BID  ? pantoprazole  40 mg Oral  Daily  ? polyvinyl alcohol  1-2 drop Both Eyes Daily  ? pramipexole  0.5 mg Oral BID  ? pregabalin  25 mg Oral BID  ? saccharomyces boulardii  250 mg Oral TID  ? tiZANidine  2 mg Oral TID  ? ?Continuous Infu

## 2021-12-22 NOTE — Progress Notes (Signed)
Initial Nutrition Assessment ? ?DOCUMENTATION CODES:  ?Morbid obesity ? ?INTERVENTION:  ?-obtain updated weight ?-Ensure Max po BID, each supplement provides 150 kcal and 30 grams of protein ?-MVI with minerals daily ? ?NUTRITION DIAGNOSIS:  ?Increased nutrient needs related to wound healing as evidenced by estimated needs. ? ?GOAL:  ?Patient will meet greater than or equal to 90% of their needs ? ?MONITOR:  ?PO intake, Supplement acceptance, Labs, Weight trends, I & O's, Skin ? ?REASON FOR ASSESSMENT:  ?Consult ?Diet education, Wound healing ? ?ASSESSMENT:  ?Pt with PMH significant for asthma, CHF, type 2 DM w/ diabetic neuropathy, CAD s/p stent x3, PVD s/p R AKA, MCTD/RA, chronic pain, hypothyroidism, HTN, and HLD admitted w/ acute on chronic CHF and LLE cellulitis ? ?RD was consulted to provide education due to pt's BMI >50 and low albumin. When a patient presents with low albumin, it is likely skewed due to the acute inflammatory response.  Unless it is suspected that patient had poor PO intake or malnutrition prior to admission, then RD should not be consulted solely for low albumin. Note that low albumin is no longer used to diagnose malnutrition; [place] uses the new malnutrition guidelines published by the American Society for Parenteral and Enteral Nutrition (A.S.P.E.N.) and the Academy of Nutrition and Dietetics (AND).  Additionally, obesity is a complex, chronic medical condition that is optimally managed by a multidisciplinary care team. Weight loss is not an ideal goal for an acute inpatient hospitalization. However, if further work-up for obesity is warranted, consider outpatient referral to outpatient bariatric service and/or [place] Nutrition and Diabetes Education [facility].  ? ?No weight taken for this admission. Will order new weight. Pt denies any recent weight changes. No PO intake documented; however, pt denies any recent changes to appetite. Will provide ONS due to pt's increased  nutrient needs for wound healing. Note pt is known to Clinical Nutrition service from previous admission and has previously refused Juven and intermittently refused Glucerna supplements.  ? ?UOP: 3064ml x24 hours ?I/O: -2346ml since admit ? ?Mild pitting edema to LUE and moderate pitting edema to LLE per RN assessment ? ?Medications: ? furosemide  40 mg Intravenous BID  ? insulin aspart  0-20 Units Subcutaneous TID WC  ? insulin aspart  0-5 Units Subcutaneous QHS  ? insulin aspart  4 Units Subcutaneous TID WC  ? insulin glargine-yfgn  10 Units Subcutaneous STAT  ? [START ON 12/23/2021] insulin glargine-yfgn  30 Units Subcutaneous Daily  ? pantoprazole  40 mg Oral Daily  ? saccharomyces boulardii  250 mg Oral TID  ?IV abx ? ?Labs: ?Recent Labs  ?Lab 12/20/21 ?1230 12/21/21 ?[id number] 12/22/21 ?0304  ?NA 140 134* 135  ?K 4.4 5.2* 4.3  ?CL 104 101 99  ?CO2 29 24 29   ?BUN 18 18 14   ?CREATININE 0.93 0.94 0.80  ?CALCIUM 8.8* 8.6* 8.6*  ?GLUCOSE 65* 310* 221*  ?Hgb 8.6 ?A1c (12/22/21) 8.5 ?CBGs: 181-243 x24 hours (Diabetes Coordinator following) ?  ?NUTRITION - FOCUSED PHYSICAL EXAM: ?Flowsheet Row Most Recent Value  ?Orbital Region No depletion  ?Upper Arm Region No depletion  ?Thoracic and Lumbar Region No depletion  ?Buccal Region No depletion  ?Temple Region No depletion  ?Clavicle Bone Region No depletion  ?Clavicle and Acromion Bone Region No depletion  ?Scapular Bone Region No depletion  ?Dorsal Hand No depletion  ?Patellar Region No depletion  ?Anterior Thigh Region No depletion  ?Posterior Calf Region No depletion  ?Edema (RD Assessment) Moderate  ?Hair Reviewed  ?  Eyes Reviewed  ?Mouth Reviewed  ?Skin Reviewed  ?Nails Reviewed  ? ?Diet Order:   ?Diet Order   ? ?       ?  Diet heart healthy/carb modified Room service appropriate? Yes; Fluid consistency: Thin; Fluid restriction: 1800 mL Fluid  Diet effective now       ?  ? ?  ?  ? ?  ? ?EDUCATION NEEDS:  ?No education needs have been identified at this time ? ?Skin:  Skin  Assessment: Skin Integrity Issues: ?Skin Integrity Issues:: Stage II, Other (Comment) ?Stage II: sacrum ?Other: R AKA, cellulitis LLE ? ?Last BM:  4/12 ? ?Height:  ?Ht Readings from Last 1 Encounters:  ?12/21/21 5\' 2"  (1.575 m)  ? ?Weight:  ?Wt Readings from Last 1 Encounters:  ?09/27/21 132 kg  ? ?Ideal Body Weight:  46 kg (adjusted for R AKA) ? ?BMI:  Body mass index is 57.9 kg/m? (adjusted for R AKA). ? ?Estimated Nutritional Needs:  ?Kcal:  1850-2050 ?Protein:  100-115 grams ?Fluid:  >2L ? ? ? ?Theone Stanley., MS, RD, LDN (she/her/hers) ?RD pager number and weekend/on-call pager number located in [place]. ?

## 2021-12-22 NOTE — Progress Notes (Signed)
Physical Therapy Treatment ?Patient Details ?Name: [name] ?MRN: [id number] ?DOB: 1961-03-12 ?Today's Date: 12/22/2021 ? ? ?History of Present Illness 61 y/o female presented to ED on 12/20/21 for L leg swelling, rash, and pain. Found to have L LE cellulitis. PMH includes asthma, CHF, CAD, HLD, HTN, DM2, obesity, connective tissue disease, chronic pain, R AKA ? ?  ?PT Comments  ? ? Patient progressing towards physical therapy goals. Patient able to ambulate 12' x 2 with RW and supervision, but required seated rest break between bouts. Performed standing R hip abduction, flexion, and extension with good form and strength. Patient fatigued after ambulation and exercises and deferred further therapy this date. D/c plan remains appropriate.  ?   ?Recommendations for follow up therapy are one component of a multi-disciplinary discharge planning process, led by the attending physician.  Recommendations may be updated based on patient status, additional functional criteria and insurance authorization. ? ?Follow Up Recommendations ? Home health PT ?  ?  ?Assistance Recommended at Discharge Intermittent Supervision/Assistance  ?Patient can return home with the following A little help with walking and/or transfers;A little help with bathing/dressing/bathroom;Assistance with cooking/housework ?  ?Equipment Recommendations ? None recommended by PT  ?  ?Recommendations for Other Services   ? ? ?  ?Precautions / Restrictions Precautions ?Precautions: Fall ?Precaution Comments: hx of R AKA, sacral wounds ?Restrictions ?Weight Bearing Restrictions: No  ?  ? ?Mobility ? Bed Mobility ?  ?  ?  ?  ?  ?  ?  ?General bed mobility comments: up in recliner on arrival ?  ? ?Transfers ?Overall transfer level: Needs assistance ?Equipment used: Rolling [name] (2 wheels) ?Transfers: Sit to/from Stand ?Sit to Stand: Supervision ?  ?  ?  ?  ?  ?General transfer comment: supervision for safety. Total of 5 sit to stands during session ?   ? ?Ambulation/Gait ?Ambulation/Gait assistance: Supervision ?Gait Distance (Feet): 12 Feet (+12') ?Assistive device: Rolling [name] (2 wheels) ?Gait Pattern/deviations:  (hop to) ?Gait velocity: decreased ?  ?  ?General Gait Details: supervision for safety. Easily fatigued after 12' and required seated rest break ? ? ?Stairs ?  ?  ?  ?  ?  ? ? ?Wheelchair Mobility ?  ? ?Modified Rankin (Stroke Patients Only) ?  ? ? ?  ?Balance Overall balance assessment: Needs assistance ?Sitting-balance support: No upper extremity supported, Feet supported ?Sitting balance-[name]: Fair ?  ?  ?Standing balance support: Bilateral upper extremity supported, Reliant on assistive device for balance ?Standing balance-[name]: Poor ?Standing balance comment: reliant on RW ?  ?  ?  ?  ?  ?  ?  ?  ?  ?  ?  ?  ? ?  ?Cognition Arousal/Alertness: Awake/alert ?Behavior During Therapy: [name] for tasks assessed/performed ?Overall Cognitive Status: Within Functional Limits for tasks assessed ?  ?  ?  ?  ?  ?  ?  ?  ?  ?  ?  ?  ?  ?  ?  ?  ?  ?  ?  ? ?  ?Exercises Amputee Exercises ?Hip Extension: Right, 10 reps, Standing ?Hip ABduction/ADduction: Right, 20 reps, Standing ?Hip Flexion/Marching: Both, 10 reps, Standing ? ?  ?General Comments   ?  ?  ? ?Pertinent Vitals/Pain Pain Assessment ?Pain Assessment: No/denies pain  ? ? ?Home Living   ?  ?  ?  ?  ?  ?  ?  ?  ?  ?   ?  ?Prior Function    ?  ?  ?   ? ?  PT Goals (current goals can now be found in the care plan section) Acute Rehab PT Goals ?Patient Stated Goal: to go home ?PT Goal Formulation: With patient ?Time For Goal Achievement: 01/04/22 ?Potential to Achieve Goals: Good ?Progress towards PT goals: Progressing toward goals ? ?  ?Frequency ? ? ? Min 3X/week ? ? ? ?  ?PT Plan Current plan remains appropriate  ? ? ?Co-evaluation   ?  ?  ?  ?  ? ?  ?AM-PAC PT "6 Clicks" Mobility   ?Outcome Measure ? Help needed turning from your back to your side while in a flat bed without using  bedrails?: A Little ?Help needed moving from lying on your back to sitting on the side of a flat bed without using bedrails?: A Little ?Help needed moving to and from a bed to a chair (including a wheelchair)?: A Little ?Help needed standing up from a chair using your arms (e.g., wheelchair or bedside chair)?: A Little ?Help needed to walk in hospital room?: A Little ?Help needed climbing 3-5 steps with a railing? : Total ?6 Click Score: 16 ? ?  ?End of Session Equipment Utilized During Treatment: Gait belt ?Activity Tolerance: Patient tolerated treatment well ?Patient left: in chair;with call bell/phone within reach ?Nurse Communication: Mobility status ?PT Visit Diagnosis: Unsteadiness on feet (R26.81);Muscle weakness (generalized) (M62.81);Other abnormalities of gait and mobility (R26.89) ?  ? ? ?Time: [id number] ?PT Time Calculation (min) (ACUTE ONLY): 34 min ? ?Charges:  $Gait Training: 8-22 mins ?$Therapeutic Exercise: 8-22 mins          ?          ? ?[name], PT, DPT ?Acute Rehabilitation Services ?Pager [phone] ?Office [phone] ? ? ? ?[name] ?12/22/2021, 1:01 PM ? ?

## 2021-12-22 NOTE — Progress Notes (Signed)
Inpatient Diabetes Program Recommendations ? ?AACE/ADA: New Consensus Statement on Inpatient Glycemic Control (2015) ? ?Target Ranges:  Prepandial:   less than 140 mg/dL ?     Peak postprandial:   less than 180 mg/dL (1-2 hours) ?     Critically ill patients:  140 - 180 mg/dL  ? ?Lab Results  ?Component Value Date  ? GLUCAP 243 (H) 12/22/2021  ? HGBA1C 8.5 (H) 12/22/2021  ? ? ?Review of Glycemic Control ? Latest Reference Range & Units 12/21/21 07:35 12/21/21 14:32 12/21/21 17:01 12/21/21 20:48 12/22/21 07:59  ?Glucose-Capillary 70 - 99 mg/dL 098197 (H) 119204 (H) 147251 (H) 181 (H) 243 (H)  ? ? ?Diabetes history: DM2 ?Outpatient Diabetes medications: Humulin R U500 40-150 units TID with meals, Metformin XR 500 mg BID ?Current orders for Inpatient glycemic control: Semglee 15 units daily, Novolog 0-20 units TID with meals, Novolog 4 units tid with meals ?Inpatient Diabetes Program Recommendations:   ? ?Consider increasing Semglee to 30 units daily.  Will follow.  ? ?Thanks,  ?Beryl MeagerJenny Reata Petrov, RN, BC-ADM ?Inpatient Diabetes Coordinator ?Pager [phone]  (8a-5p) ? ?

## 2021-12-23 ENCOUNTER — Encounter: Payer: Self-pay | Admitting: Family Medicine

## 2021-12-23 DIAGNOSIS — E039 Hypothyroidism, unspecified: Secondary | ICD-10-CM | POA: Diagnosis not present

## 2021-12-23 DIAGNOSIS — E669 Obesity, unspecified: Secondary | ICD-10-CM

## 2021-12-23 DIAGNOSIS — L03116 Cellulitis of left lower limb: Secondary | ICD-10-CM | POA: Diagnosis not present

## 2021-12-23 DIAGNOSIS — D509 Iron deficiency anemia, unspecified: Secondary | ICD-10-CM

## 2021-12-23 DIAGNOSIS — E875 Hyperkalemia: Secondary | ICD-10-CM

## 2021-12-23 DIAGNOSIS — E1169 Type 2 diabetes mellitus with other specified complication: Secondary | ICD-10-CM

## 2021-12-23 DIAGNOSIS — I5033 Acute on chronic diastolic (congestive) heart failure: Secondary | ICD-10-CM | POA: Diagnosis not present

## 2021-12-23 DIAGNOSIS — K760 Fatty (change of) liver, not elsewhere classified: Secondary | ICD-10-CM

## 2021-12-23 DIAGNOSIS — E66813 Obesity, class 3: Secondary | ICD-10-CM | POA: Diagnosis present

## 2021-12-23 LAB — CBC
HCT: 31 % — ABNORMAL LOW (ref 36.0–46.0)
Hemoglobin: 8.8 g/dL — ABNORMAL LOW (ref 12.0–15.0)
MCH: 20 pg — ABNORMAL LOW (ref 26.0–34.0)
MCHC: 28.4 g/dL — ABNORMAL LOW (ref 30.0–36.0)
MCV: 70.3 fL — ABNORMAL LOW (ref 80.0–100.0)
Platelets: 336 10*3/uL (ref 150–400)
RBC: 4.41 MIL/uL (ref 3.87–5.11)
RDW: 17.7 % — ABNORMAL HIGH (ref 11.5–15.5)
WBC: 10.8 10*3/uL — ABNORMAL HIGH (ref 4.0–10.5)
nRBC: 0 % (ref 0.0–0.2)

## 2021-12-23 LAB — COMPREHENSIVE METABOLIC PANEL
ALT: 26 U/L (ref 0–44)
AST: 37 U/L (ref 15–41)
Albumin: 3 g/dL — ABNORMAL LOW (ref 3.5–5.0)
Alkaline Phosphatase: 87 U/L (ref 38–126)
Anion gap: 8 (ref 5–15)
BUN: 21 mg/dL — ABNORMAL HIGH (ref 6–20)
CO2: 31 mmol/L (ref 22–32)
Calcium: 8.8 mg/dL — ABNORMAL LOW (ref 8.9–10.3)
Chloride: 94 mmol/L — ABNORMAL LOW (ref 98–111)
Creatinine, Ser: 1.04 mg/dL — ABNORMAL HIGH (ref 0.44–1.00)
GFR, Estimated: >60 mL/min (ref >60)
Glucose, Bld: 246 mg/dL — ABNORMAL HIGH (ref 70–99)
Potassium: 4.3 mmol/L (ref 3.5–5.1)
Sodium: 133 mmol/L — ABNORMAL LOW (ref 135–145)
Total Bilirubin: 0.3 mg/dL (ref 0.3–1.2)
Total Protein: 6.9 g/dL (ref 6.5–8.1)

## 2021-12-23 LAB — GLUCOSE, CAPILLARY
Glucose-Capillary: 238 mg/dL — ABNORMAL HIGH (ref 70–99)
Glucose-Capillary: 298 mg/dL — ABNORMAL HIGH (ref 70–99)
Glucose-Capillary: 143 mg/dL — ABNORMAL HIGH (ref 70–99)
Glucose-Capillary: 194 mg/dL — ABNORMAL HIGH (ref 70–99)

## 2021-12-23 MED ORDER — INSULIN GLARGINE-YFGN 100 UNIT/ML ~~LOC~~ SOLN
15.0000 [IU] | Freq: Every day | SUBCUTANEOUS | Status: DC
  Administered 2021-12-24: 15 [IU] via SUBCUTANEOUS
  Filled 2021-12-23 (×2): qty 0.15

## 2021-12-23 MED ORDER — HYDROCORTISONE 1 % EX CREA
TOPICAL_CREAM | Freq: Two times a day (BID) | CUTANEOUS | Status: DC
  Filled 2021-12-23 (×2): qty 28

## 2021-12-23 MED ORDER — HYDROXYCHLOROQUINE SULFATE 200 MG PO TABS
200.0000 mg | ORAL_TABLET | Freq: Two times a day (BID) | ORAL | 0 refills | Status: DC
Start: 2021-12-23 — End: 2022-05-09

## 2021-12-23 NOTE — TOC Initial Note (Signed)
Transition of Care (TOC) - Initial/Assessment Note  ? ? ?Patient Details  ?Name: [name] ?MRN: [id number] ?Date of Birth: December 24, 1960 ? ?Transition of Care (TOC) CM/SW Contact:    ?Tom-Johnson, Renea Ee, RN ?Phone Number: ?12/23/2021, 4:03 PM ? ?Clinical Narrative:                 ? ?CM spoke with patient at bedside about needs for post hospital transition. Admitted for Acute on Chronic CHF.  ?From home with husband. Has a son and two brothers that are supportive with care. Currently not employed and not on disability. Hs a walker, wheelchair and shower seat at home.  ?PCP is [place], Crosby Oyster, DO and uses [facility] on [address].  ?Home health PT/OT recommended and CM gave patient list of agencies from Medicare.gov and she chose [facility]. Referral called in to [facility] with acceptance voiced.  ?Family to transport at discharge. CM will continue to follow with needs. ? ?Expected Discharge Plan: [place] ?Barriers to Discharge: Continued Medical Work up ? ? ?Patient Goals and CMS Choice ?Patient states their goals for this hospitalization and ongoing recovery are:: To return home ?CMS Medicare.gov Compare Post Acute Care list provided to:: Patient ?Choice offered to / list presented to : Patient ? ?Expected Discharge Plan and Services ?Expected Discharge Plan: [place] ?  ?Discharge Planning Services: CM Consult ?Post Acute Care Choice: Home Health ?Living arrangements for the past 2 months: [place] ?                ?DME Arranged: N/A ?DME Agency: NA ?  ?  ?  ?HH Arranged: PT, OT ?[place] Agency: [facility] ([place]) ?Date HH Agency Contacted: 12/23/21 ?Time [place]: 1600 ?Representative spoke with at [place]: Ramond Marrow ? ?Prior Living Arrangements/Services ?Living arrangements for the past 2 months: [place] ?Lives with:: Spouse ?Patient language and need for interpreter reviewed:: Yes ?Do you feel safe going back to  the place where you live?: Yes      ?Need for Family Participation in Patient Care: Yes (Comment) ?Care giver support system in place?: Yes (comment) ?  ?Criminal Activity/Legal Involvement Pertinent to Current Situation/Hospitalization: No - Comment as needed ? ?Activities of Daily Living ?Home Assistive Devices/Equipment: Gilford Rile (specify type) ?ADL Screening (condition at time of admission) ?Patient's cognitive ability adequate to safely complete daily activities?: Yes ?Is the patient deaf or have difficulty hearing?: Yes ?Does the patient have difficulty seeing, even when wearing glasses/contacts?: No ?Does the patient have difficulty concentrating, remembering, or making decisions?: No ?Patient able to express need for assistance with ADLs?: Yes ?Does the patient have difficulty dressing or bathing?: Yes ?Independently performs ADLs?: No ?Does the patient have difficulty walking or climbing stairs?: Yes ?Weakness of Legs: Left ?Weakness of Arms/Hands: None ? ?Permission Sought/Granted ?Permission sought to share information with : Case Manager, Customer service manager, Family Supports ?Permission granted to share information with : Yes, Verbal Permission Granted ? Share Information with NAME: Ramond Marrow ? Permission granted to share info w AGENCY: [facility] ?   ?   ? ?Emotional Assessment ?Appearance:: Appears stated age ?Attitude/Demeanor/Rapport: Engaged, Gracious ?Affect (typically observed): Accepting, Appropriate, Calm, Hopeful ?Orientation: : Oriented to Self, Oriented to Place, Oriented to  Time, Oriented to Situation ?Alcohol / Substance Use: Not Applicable ?Psych Involvement: No (comment) ? ?Admission diagnosis:  Cellulitis [L03.90] ?Left leg cellulitis [id number] ?Uncontrolled pain [R52] ?Patient Active Problem List  ? Diagnosis Date  Noted  ? Class 3 obesity ([place]) 12/23/2021  ? Iron deficiency anemia 12/23/2021  ? Acute on chronic diastolic CHF (congestive heart failure) ([place]) 12/22/2021  ?  Hyperkalemia 12/22/2021  ? Left leg cellulitis 12/20/2021  ? Peripheral vascular disease, unspecified ([place]) 10/10/2021  ? Unilateral AKA, right ([place]) 10/10/2021  ? Hx of AKA (above knee amputation), right ([place]) 07/22/2021  ? Hx of BKA, right ([place]) 06/29/2021  ? Dehiscence of amputation stump (HCC)   ? Acute blood loss anemia 06/24/2021  ? Postoperative wound infection 06/24/2021  ? UTI (urinary tract infection) 06/24/2021  ? Superficial dehiscence of operation wound 06/24/2021  ? 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 ([place])   ? Bacteremia due to Enterococcus 05/28/2021  ? Sepsis ([place]) 05/26/2021  ? Digestive disorder 05/18/2021  ? Bilateral lower extremity edema 05/02/2021  ? Statin myopathy 03/04/2021  ? Aortic stenosis 03/04/2021  ? Sjogren's syndrome ([place]) 02/11/2020  ? Cutaneous abscess of right foot   ? Statin intolerance 08/16/2019  ? Gram-negative bacteremia 10/18/2018  ? Streptococcal bacteremia 10/18/2018  ? CAD S/P percutaneous coronary angioplasty 04/19/2018  ? Essential hypertension   ? Moderate persistent asthma 03/21/2018  ? NAFLD (nonalcoholic fatty liver disease)   ? Recurrent cellulitis of lower extremity 01/02/2018  ? Cellulitis of lower leg 01/02/2018  ? Chronic diarrhea 05/08/2017  ? H/O Clostridium difficile infection 05/08/2017  ? Rectal bleeding 05/08/2017  ? Hyperbilirubinemia 04/29/2016  ? Hyponatremia 04/29/2016  ? Cellulitis of right foot 03/09/2016  ? Allergic rhinitis due to pollen 02/15/2016  ? Anaphylactic reaction due to food 02/10/2016  ? Diabetes mellitus type 2 in obese ([place]) 02/10/2016  ? Gastroesophageal reflux disease without esophagitis 02/10/2016  ? Atopic eczema 02/10/2016  ? Cervical nerve root disorder 12/15/2015  ? Lumbar radiculopathy 12/15/2015  ? Daytime somnolence 11/26/2015  ? Venous stasis dermatitis of both lower extremities 02/11/2015  ? Morbid obesity ([place]) 06/19/2014  ? Abnormal LFTs 03/26/2014  ? B-complex deficiency 03/26/2014  ?  Benign essential HTN 03/26/2014  ? Chronic pain associated with significant psychosocial dysfunction 03/26/2014  ? Diaphragmatic hernia 03/26/2014  ? Gastroesophageal reflux disease 03/26/2014  ? Hammer toe 03/26/2014  ? H/O neoplasm 03/26/2014  ? Adaptive colitis 03/26/2014  ? Deafness, sensorineural 03/26/2014  ? Fibromyalgia 01/20/2014  ? 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/20/2014  ? Hyperlipidemia 11/11/2012  ? Mixed connective tissue disease ([place]) 11/11/2012  ? Hypothyroidism 11/11/2012  ? Uncomplicated asthma [id number]  ? 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[place]) 02/20/2012  ? 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 ANA positive 07/17/2011  ? ?PCP:  Shelda Pal, DO ?Pharmacy:   ?[facility] [place] [facility] [address] ?[address] ?[place] [address] ?Phone: [phone] Fax: [phone] ? ? ? ? ?Social Determinants of Health (SDOH) Interventions ?  ? ?Readmission Risk Interventions ? ?  12/23/2021  ?  4:00 PM  ?Readmission Risk Prevention Plan  ?Transportation Screening Complete  ?Medication Review Press photographer) Complete  ?PCP or Specialist appointment within 3-5 days of discharge Complete  ?[place] or Home Care Consult Complete  ?SW Recovery Care/Counseling Consult Complete  ?Palliative Care Screening Not Applicable  ?[place] Not Applicable  ? ? ? ?

## 2021-12-23 NOTE — Assessment & Plan Note (Signed)
Elevated TSH, but clinically euthyroid ?Continue with current dose of levothyroxine and follow up as outpatient.  ?

## 2021-12-23 NOTE — Assessment & Plan Note (Signed)
Right leg AKA. ? ?Today she has dressing over right lower extremity rash. ?She has been afebrile. ?Wbc 10,8 ? ?Plan to continue antibiotic therapy with ceftriaxone ?Continue local wound care.  ?

## 2021-12-23 NOTE — Assessment & Plan Note (Signed)
Iron level with serum iron of 24, transferring saturation of 5 with ferritin of 7 and TIBC 462 ?Consistent in iron deficiency anemia ? ?Plan to add IV iron x 2 doses and follow up iron panel as outpatient.  ?B12 is 1,150  ?Folic acid 48  ?

## 2021-12-23 NOTE — Consult Note (Signed)
WOC Nurse Consult Note: ?Patient receiving care in [place] ?Seen by my colleague M. Austin on 4/12. ?Reason for Consult:pressure ulcer stage 2 gluteal cheeks bilateral, posterior thigh wound, non pressure wound umbilicus, non pressure wound anterior ankle ?The following orders were placed: ? ?1. Cleanse wound posterior surface of the mons with saline, pat dry. Cover with silicone foam, change every 3 days. Lift to inspect each shift, report acute changes to the [place] nurse  ?2. Cleanse bilateral ischial wounds with saline, pat dry.  Cover with silicone foam, change every 3 days. Lift to inspect each shift, report acute changes in to the [facility] nurse. ? ?Cleanse abdominal wound with saline, pat dry. Apply hydrogel Kellie Simmering # [id number]) to the open areas, top with dry dressing. Change daily ? ?Monitor the wound area(s) for worsening of condition such as: ?Signs/symptoms of infection, increase in size, development of or worsening of odor, ?development of pain, or increased pain at the affected locations.   ?Notify the medical team if any of these develop. ? ?Thank you for the consult. [place] nurse will not follow at this time.   ?Please re-consult the [place] team if needed. ? ?Cathlean Marseilles. Tamala Julian, MSN, RN, CMSRN, AGCNS, WTA ?Wound Treatment Associate ?Pager [phone]   ?

## 2021-12-23 NOTE — Assessment & Plan Note (Signed)
No chest pain, continue blood pressure control.  

## 2021-12-23 NOTE — Assessment & Plan Note (Addendum)
Hyperglycemia ? ?Fasting glucose is 246 mg/dl ?Plan to continue insulin sliding scale for glucose cover and monitoring.  ?Today had basal 30 units, with 4 units of short acting insulin pre meal. ?Will resume long acting insulin 15 units for now and continue with adjustments.  ?

## 2021-12-23 NOTE — Assessment & Plan Note (Signed)
Echocardiogram with preserved LV systolic function with EF 55 to 60% poor visualization with no further details.  ? ?Urine output is 3,550 ml over last 24 hrs  ?Improvement of edema but not back to baseline ?Systolic blood pressure 123456 to 130 range. ? ?Continue diuresis with IV furosemide 40 mg Q12. ?Blood pressure control with irbesartan.  ? ?

## 2021-12-23 NOTE — Assessment & Plan Note (Signed)
No signs of acute flare ?Plan to continue with hydroxychloroquine.  ?

## 2021-12-23 NOTE — Progress Notes (Signed)
Inpatient Diabetes Program Recommendations ? ?AACE/ADA: New Consensus Statement on Inpatient Glycemic Control (2015) ? ?Target Ranges:  Prepandial:   less than 140 mg/dL ?     Peak postprandial:   less than 180 mg/dL (1-2 hours) ?     Critically ill patients:  140 - 180 mg/dL  ? ?Lab Results  ?Component Value Date  ? GLUCAP 238 (H) 12/23/2021  ? HGBA1C 8.5 (H) 12/22/2021  ? ? ?Review of Glycemic Control ? Latest Reference Range & Units 12/22/21 07:59 12/22/21 11:54 12/22/21 15:56 12/22/21 20:41 12/23/21 07:22  ?Glucose-Capillary 70 - 99 mg/dL 573 (H) 220 (H) 254 (H) 227 (H) 238 (H)  ? ? ?Diabetes history: DM2 ?Outpatient Diabetes medications: Humulin R U500 40-150 units TID with meals, Metformin XR 500 mg BID ?Current orders for Inpatient glycemic control:  ?Semglee 25 units daily ?Novolog 0-20 units TID with meals ?Novolog 4 units tid with meals ? ?Inpatient Diabetes Program Recommendations:   ? ?-  Consider increasing Semglee to 30 units daily.   ? ?Will follow.  ? ?Thanks,  ?Christena Deem RN, MSN, BC-ADM ?Inpatient Diabetes Coordinator ?Team Pager [phone] (8a-5p) ? ? ?

## 2021-12-23 NOTE — Assessment & Plan Note (Signed)
No signs of decompensated liver disease. ?Follow up as outpatient.  ?

## 2021-12-23 NOTE — Progress Notes (Signed)
Occupational Therapy Treatment ?Patient Details ?Name: [name] ?MRN: [id number] ?DOB: September 11, 1961 ?Today's Date: 12/23/2021 ? ? ?History of present illness 61 y/o female presented to ED on 12/20/21 for L leg swelling, rash, and pain. Found to have L LE cellulitis. PMH includes asthma, CHF, CAD, HLD, HTN, DM2, obesity, connective tissue disease, chronic pain, R AKA ?  ?OT comments ? Pt making good progress with OT goals this session. She tolerated increased mobility, required less assist for transfers, and demonstrated good balance with RW. She continues to require increased assist for LB ADL's, due to body habitus and weakness. OT continues to recommend [facility] OT and will continue to follow acutely.   ? ?Recommendations for follow up therapy are one component of a multi-disciplinary discharge planning process, led by the attending physician.  Recommendations may be updated based on patient status, additional functional criteria and insurance authorization. ?   ?Follow Up Recommendations ? Home health OT  ?  ?Assistance Recommended at Discharge Intermittent Supervision/Assistance  ?Patient can return home with the following ? A little help with walking and/or transfers;A lot of help with bathing/dressing/bathroom;Assistance with cooking/housework ?  ?Equipment Recommendations ? None recommended by OT  ?  ?Recommendations for Other Services   ? ?  ?Precautions / Restrictions Precautions ?Precautions: Fall ?Precaution Comments: hx of R AKA, sacral wounds ?Restrictions ?Weight Bearing Restrictions: No  ? ? ?  ? ?Mobility Bed Mobility ?  ?  ?  ?  ?  ?  ?  ?General bed mobility comments: up in recliner on arrival ?  ? ?Transfers ?Overall transfer level: Needs assistance ?Equipment used: Rolling walker (2 wheels) ?Transfers: Sit to/from Stand ?Sit to Stand: Supervision ?  ?  ?  ?  ?  ?General transfer comment: supervision for safety. ?  ?  ?Balance Overall balance assessment: Needs assistance ?Sitting-balance support: No  upper extremity supported, Feet supported ?Sitting balance-[name]: Fair ?  ?  ?Standing balance support: Bilateral upper extremity supported, Reliant on assistive device for balance ?Standing balance-[name]: Poor ?Standing balance comment: reliant on RW ?  ?  ?  ?  ?  ?  ?  ?  ?  ?  ?  ?   ? ?ADL either performed or assessed with clinical judgement  ? ?ADL Overall ADL's : Needs assistance/impaired ?  ?  ?  ?  ?  ?  ?Lower Body Bathing: Moderate assistance;Sitting/lateral leans;Sit to/from stand ?Lower Body Bathing Details (indicate cue type and reason): able to get to all areas except for bottom and foot ?  ?  ?Lower Body Dressing: Maximal assistance;Sitting/lateral leans ?Lower Body Dressing Details (indicate cue type and reason): unable to don/doff sock ?Toilet Transfer: Min guard;Ambulation ?Toilet Transfer Details (indicate cue type and reason): short distance, able tohop across room to toilet for toileting, no physical assistance to transfer ?Toileting- Clothing Manipulation and Hygiene: Moderate assistance;Sitting/lateral lean;Sit to/from stand ?Toileting - Clothing Manipulation Details (indicate cue type and reason): Pt with difficulty reaching bottom ?  ?  ?Functional mobility during ADLs: Min guard;Rolling walker (2 wheels) ?General ADL Comments: Pt with increased tolerance and strength this session ?  ? ?Extremity/Trunk Assessment   ?  ?  ?  ?  ?  ? ?Vision   ?  ?  ?Perception   ?  ?Praxis   ?  ? ?Cognition Arousal/Alertness: Awake/alert ?Behavior During Therapy: [facility] for tasks assessed/performed ?Overall Cognitive Status: Within Functional Limits for tasks assessed ?  ?  ?  ?  ?  ?  ?  ?  ?  ?  ?  ?  ?  ?  ?  ?  ?  ?  ?  ?   ?  Exercises   ? ?  ?Shoulder Instructions   ? ? ?  ?General Comments    ? ? ?Pertinent Vitals/ Pain       Pain Assessment ?Pain Assessment: Faces ?Faces Pain Scale: Hurts a little bit ?Pain Location: L LE and bottom ?Pain Descriptors / Indicators: Grimacing, Discomfort ?Pain  Intervention(s): Monitored during session, Repositioned ? ?Home Living   ?  ?  ?  ?  ?  ?  ?  ?  ?  ?  ?  ?  ?  ?  ?  ?  ?  ?  ? ?  ?Prior Functioning/Environment    ?  ?  ?  ?   ? ?Frequency ? Min 2X/week  ? ? ? ? ?  ?Progress Toward Goals ? ?OT Goals(current goals can now be found in the care plan section) ? Progress towards OT goals: Progressing toward goals ? ?Acute Rehab OT Goals ?Patient Stated Goal: To safely mobilize in herhome ?OT Goal Formulation: With patient ?Time For Goal Achievement: 01/04/22 ?Potential to Achieve Goals: Good ?ADL Goals ?Pt Will Perform Lower Body Bathing: with min guard assist;sitting/lateral leans;sit to/from stand ?Pt Will Perform Lower Body Dressing: with min guard assist;sitting/lateral leans;sit to/from stand ?Pt Will Transfer to Toilet: with min guard assist;stand pivot transfer ?Pt Will Perform Toileting - Clothing Manipulation and hygiene: with min guard assist;sitting/lateral leans;sit to/from stand ?Additional ADL Goal #1: Pt will tolerate standing ADL tasks for 3 mins with no rest break  ?Plan Discharge plan remains appropriate;Frequency remains appropriate   ? ?Co-evaluation ? ? ?   ?  ?  ?  ?  ? ?  ?AM-PAC OT "6 Clicks" Daily Activity     ?Outcome Measure ? ? Help from another person eating meals?: None ?Help from another person taking care of personal grooming?: None ?Help from another person toileting, which includes using toliet, bedpan, or urinal?: A Lot ?Help from another person bathing (including washing, rinsing, drying)?: A Lot ?Help from another person to put on and taking off regular upper body clothing?: None ?Help from another person to put on and taking off regular lower body clothing?: A Lot ?6 Click Score: 18 ? ?  ?End of Session Equipment Utilized During Treatment: Rolling walker (2 wheels) ? ?OT Visit Diagnosis: Unsteadiness on feet (R26.81);Other abnormalities of gait and mobility (R26.89);Muscle weakness (generalized) (M62.81) ?  ?Activity Tolerance  Patient tolerated treatment well ?  ?Patient Left Other (comment) (up with PT) ?  ?Nurse Communication Mobility status ?  ? ?   ? ?Time: 1610-96041304-1330 ?OT Time Calculation (min): 26 min ? ?Charges: OT General Charges ?$OT Visit: 1 Visit ?OT Treatments ?$Self Care/Home Management : 23-37 mins ? ?Klein Willcox H., OTR/L ?Acute Rehabilitation ? ?[name] ?12/23/2021, 6:25 PM ?

## 2021-12-23 NOTE — Progress Notes (Signed)
Heart Failure Navigator Progress Note ? ?Assessed for Heart & Vascular TOC clinic readiness.  ?Patient does not meet criteria due to no benefit at this time. . EF 60-65%, leg cellulitis. ? ? ? ?[name], BSN, RN ?Heart Failure Nurse Navigator ?Secure Chat Only   ?

## 2021-12-23 NOTE — Assessment & Plan Note (Signed)
Hyponatremia ? ?Renal function stable with serum cr at 1,0 with k at 4,3 and serum bicarbonate at 31. Na is 133.  ? ?Plan to continue diuresis for hypervolemia and follow up renal function in am. ?Avoid hypotension and nephrotoxic medications.  ?

## 2021-12-23 NOTE — Progress Notes (Addendum)
?Progress Note ? ? ?Patient: [name] JXB:[id number] DOB: [birth year] DOA: 12/20/2021     3 ?DOS: the patient was seen and examined on 12/23/2021 ?  ?Brief hospital course: ?[name] was admitted to the hospital with the working diagnosis of left leg cellulitis complicated with heart failure.  ? ?61 y.o. 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  Reportedly she has not been taking Lasix due to desire to avoid frequent trips to the bathroom. On her initial physical examination her blood pressure was 125/61, HR 96, RR 20, 02 97%, lungs with no rales or wheezing, heart with S1 and S2 present and rhythmic, abdomen soft and not tender. Left leg with tender erythema, multiple skin ulcers, positive bilateral edema ++.  ? ?Na 140, K 4,4 CL 104, bicarbonate 29, glucose 65, bun 18 cr 0,93 ?Lactic acid 2,1  ?Wbc 10.7 hgb 9.1 plt 310  ? ?Patient was placed on antibiotic therapy and resumed on diuretics.  ? ? ?Assessment and Plan: ?* Acute on chronic diastolic CHF (congestive heart failure) (HCC) ?Echocardiogram with preserved LV systolic function with EF 55 to 60% poor visualization with no further details.  ? ?Urine output is 3,550 ml over last 24 hrs  ?Improvement of edema but not back to baseline ?Systolic blood pressure 120 to 130 range. ? ?Continue diuresis with IV furosemide 40 mg Q12. ?Blood pressure control with irbesartan.  ? ? ?Left leg cellulitis ?Right leg AKA. ? ?Today she has dressing over right lower extremity rash. ?She has been afebrile. ?Wbc 10,8 ? ?Plan to continue antibiotic therapy with ceftriaxone ?Continue local wound care.   ? ?Hypothyroidism ?Elevated TSH, but clinically euthyroid ?Continue with current dose of levothyroxine and follow up as outpatient.  ? ?Hyperkalemia ?Hyponatremia ? ?Renal function stable with serum cr at 1,0 with k at 4,3 and serum bicarbonate at 31. Na is 133.  ? ?Plan to continue diuresis for hypervolemia and follow up renal function in am. ?Avoid hypotension and nephrotoxic medications.  ? ?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HCC) ?No signs of acute flare ?Plan to continue with hydroxychloroquine.  ? ?Diabetes mellitus type 2 in obese [employer]) ?Hyperglycemia ? ?Fasting glucose is 246 mg/dl ?Plan to continue insulin sliding scale for glucose cover and monitoring.  ?Today had basal 30 units, with 4 units of short acting insulin pre meal. ?Will resume long acting insulin 15 units for now and continue with adjustments.  ? ?NAFLD (nonalcoholic fatty liver disease) ?No signs of decompensated liver disease. ?Follow up as outpatient.  ? ?Essential hypertension ?Continue blood pressure monitoring. ?On irbesartan.   ? ?CAD S/P percutaneous coronary angioplasty ?No chest pain, continue blood pressure control.  ? ?Hx of AKA (above knee amputation), right (HCC) ?Continue PT and OT ? ?Iron deficiency anemia ?Iron level with serum iron of 24, transferring saturation of 5 with ferritin of 7 and TIBC 462 ?Consistent in iron deficiency anemia ? ?Plan to add IV iron x 2 doses and follow up iron panel as outpatient.  ?B12 is 1,150  ?Folic acid 48  ? ?Class 3 obesity (HCC) ?Calculated BMI is 53,7  ? ? ? ? ?  ? ?  Subjective: Patient is feeling better but not back to baseline, continue to have edema.  ? ?Physical Exam: ?Vitals:  ? 12/22/21 1935 12/23/21 0500 12/23/21 0511 12/23/21 0859  ?BP: 120/66  132/80 129/88  ?Pulse: 89  92 94  ?Resp: 15  18 18   ?Temp: 98 ?F (36.7 ?C)  97.9 ?F (36.6 ?C) 98 ?F (36.7 ?C)  ?TempSrc: Oral  Oral Oral  ?SpO2: 96%  98% 100%  ?Weight:  133.2 kg    ?Height:  5\' 2"  (1.575 m)    ? ?Neurology awake and  alert ?ENT with mild pallor ?Cardiovascular with S1 and S2 present and rhythmic with no gallops or murmurs ?Respiratory with mild rales at bases ?Abdomen protuberant but not distended ?Left lower extremity with ++ pitting edema dressing in place ?Right AKA with edema at the stump  ?Data Reviewed: ? ? ? ?Family Communication: no family at the bedside  ? ?Disposition: ?Status is: Inpatient ?Remains inpatient appropriate because: heart failure  ? Planned Discharge Destination: Home ? ? ? ? ?Author: ?[name] Keens, MD ?12/23/2021 4:04 PM ? ?For on call review [email or web address].  ?

## 2021-12-23 NOTE — Progress Notes (Signed)
Physical Therapy Treatment ?Patient Details ?Name: [name] ?MRN: [id number] ?DOB: [birth year] ?Today's Date: 12/23/2021 ? ? ?History of Present Illness 61 y/o female presented to ED on 12/20/21 for L leg swelling, rash, and pain. Found to have L LE cellulitis. PMH includes asthma, CHF, CAD, HLD, HTN, DM2, obesity, connective tissue disease, chronic pain, R AKA ? ?  ?PT Comments  ? ? Patient progressing towards physical therapy goals. Patient increasing reps for standing exercises this session. Patient continues to improve activity tolerance. D/c plan remains appropriate.    ?Recommendations for follow up therapy are one component of a multi-disciplinary discharge planning process, led by the attending physician.  Recommendations may be updated based on patient status, additional functional criteria and insurance authorization. ? ?Follow Up Recommendations ? Home health PT ?  ?  ?Assistance Recommended at Discharge Intermittent Supervision/Assistance  ?Patient can return home with the following A little help with walking and/or transfers;A little help with bathing/dressing/bathroom;Assistance with cooking/housework ?  ?Equipment Recommendations ? None recommended by PT  ?  ?Recommendations for Other Services   ? ? ?  ?Precautions / Restrictions Precautions ?Precautions: Fall ?Precaution Comments: hx of R AKA, sacral wounds ?Restrictions ?Weight Bearing Restrictions: No  ?  ? ?Mobility ? Bed Mobility ?  ?  ?  ?  ?  ?  ?  ?General bed mobility comments: up in recliner on arrival ?  ? ?Transfers ?Overall transfer level: Needs assistance ?Equipment used: Rolling Tylene Quashie (2 wheels) ?Transfers: Sit to/from Stand ?Sit to Stand: Supervision ?  ?  ?  ?  ?  ?General transfer comment: supervision for safety. Able to perform repeated sit to stand x 5. Cues for hand placement but patient prefers to pull up on RW ?  ? ?Ambulation/Gait ?Ambulation/Gait assistance: Supervision ?Gait Distance (Feet): 12 Feet ?Assistive device:  Rolling Belvia Gotschall (2 wheels) ?Gait Pattern/deviations:  (hop to) ?Gait velocity: decreased ?  ?  ?General Gait Details: supervision for safety ? ? ?Stairs ?  ?  ?  ?  ?  ? ? ?Wheelchair Mobility ?  ? ?Modified Rankin (Stroke Patients Only) ?  ? ? ?  ?Balance Overall balance assessment: Needs assistance ?Sitting-balance support: No upper extremity supported, Feet supported ?Sitting balance-[name]: Fair ?  ?  ?Standing balance support: Bilateral upper extremity supported, Reliant on assistive device for balance ?Standing balance-[name]: Poor ?Standing balance comment: reliant on RW ?  ?  ?  ?  ?  ?  ?  ?  ?  ?  ?  ?  ? ?  ?Cognition Arousal/Alertness: Awake/alert ?Behavior During Therapy: [facility] for tasks assessed/performed ?Overall Cognitive Status: Within Functional Limits for tasks assessed ?  ?  ?  ?  ?  ?  ?  ?  ?  ?  ?  ?  ?  ?  ?  ?  ?  ?  ?  ? ?  ?Exercises Amputee Exercises ?Hip Extension: Right, 20 reps, Standing ?Hip ABduction/ADduction: Right, 20 reps, Standing ?Hip Flexion/Marching: Right, 20 reps, Standing ? ?  ?General Comments   ?  ?  ? ?Pertinent Vitals/Pain Pain Assessment ?Pain Assessment: Faces ?Faces Pain Scale: Hurts a little bit ?Pain Location: L LE and bottom ?Pain Descriptors / Indicators: Grimacing, Discomfort ?Pain Intervention(s): Monitored during session  ? ? ?Home Living   ?  ?  ?  ?  ?  ?  ?  ?  ?  ?   ?  ?Prior Function    ?  ?  ?   ? ?  PT Goals (current goals can now be found in the care plan section) Acute Rehab PT Goals ?Patient Stated Goal: to go home ?PT Goal Formulation: With patient ?Time For Goal Achievement: 01/04/22 ?Potential to Achieve Goals: Good ?Progress towards PT goals: Progressing toward goals ? ?  ?Frequency ? ? ? Min 3X/week ? ? ? ?  ?PT Plan Current plan remains appropriate  ? ? ?Co-evaluation   ?  ?  ?  ?  ? ?  ?AM-PAC PT "6 Clicks" Mobility   ?Outcome Measure ? Help needed turning from your back to your side while in a flat bed without using bedrails?: A  Little ?Help needed moving from lying on your back to sitting on the side of a flat bed without using bedrails?: A Little ?Help needed moving to and from a bed to a chair (including a wheelchair)?: A Little ?Help needed standing up from a chair using your arms (e.g., wheelchair or bedside chair)?: A Little ?Help needed to walk in hospital room?: A Little ?Help needed climbing 3-5 steps with a railing? : Total ?6 Click Score: 16 ? ?  ?End of Session Equipment Utilized During Treatment: Gait belt ?Activity Tolerance: Patient tolerated treatment well ?Patient left: in chair;with call bell/phone within reach ?Nurse Communication: Mobility status ?PT Visit Diagnosis: Unsteadiness on feet (R26.81);Muscle weakness (generalized) (M62.81);Other abnormalities of gait and mobility (R26.89) ?  ? ? ?Time: [id number] ?PT Time Calculation (min) (ACUTE ONLY): 37 min ? ?Charges:  $Gait Training: 8-22 mins ?$Therapeutic Exercise: 8-22 mins          ?          ? ?Viveka Wilmeth A. Gilford Rile, PT, DPT ?Acute Rehabilitation Services ?Pager [phone] ?Office [phone] ? ? ? ?Jlyn Cerros A Kayvon Mo ?12/23/2021, 4:50 PM ? ?

## 2021-12-23 NOTE — Assessment & Plan Note (Signed)
Continue blood pressure monitoring. ?On irbesartan.   ?

## 2021-12-23 NOTE — Assessment & Plan Note (Signed)
Continue PT and OT

## 2021-12-23 NOTE — Assessment & Plan Note (Signed)
Calculated BMI is 53,7  ?

## 2021-12-24 DIAGNOSIS — L89152 Pressure ulcer of sacral region, stage 2: Secondary | ICD-10-CM | POA: Diagnosis present

## 2021-12-24 DIAGNOSIS — I5033 Acute on chronic diastolic (congestive) heart failure: Secondary | ICD-10-CM | POA: Diagnosis not present

## 2021-12-24 LAB — BASIC METABOLIC PANEL
Anion gap: 12 (ref 5–15)
BUN: 28 mg/dL — ABNORMAL HIGH (ref 6–20)
CO2: 28 mmol/L (ref 22–32)
Calcium: 8.9 mg/dL (ref 8.9–10.3)
Chloride: 95 mmol/L — ABNORMAL LOW (ref 98–111)
Creatinine, Ser: 1.43 mg/dL — ABNORMAL HIGH (ref 0.44–1.00)
GFR, Estimated: 42 mL/min — ABNORMAL LOW (ref >60)
Glucose, Bld: 224 mg/dL — ABNORMAL HIGH (ref 70–99)
Potassium: 4.2 mmol/L (ref 3.5–5.1)
Sodium: 135 mmol/L (ref 135–145)

## 2021-12-24 LAB — GLUCOSE, CAPILLARY
Glucose-Capillary: 208 mg/dL — ABNORMAL HIGH (ref 70–99)
Glucose-Capillary: 167 mg/dL — ABNORMAL HIGH (ref 70–99)
Glucose-Capillary: 213 mg/dL — ABNORMAL HIGH (ref 70–99)
Glucose-Capillary: 259 mg/dL — ABNORMAL HIGH (ref 70–99)

## 2021-12-24 LAB — MAGNESIUM: Magnesium: 1.8 mg/dL (ref 1.7–2.4)

## 2021-12-24 MED ORDER — METHOCARBAMOL 1000 MG/10ML IJ SOLN
500.0000 mg | Freq: Once | INTRAVENOUS | Status: AC
  Administered 2021-12-24: 500 mg via INTRAVENOUS
  Filled 2021-12-24 (×2): qty 5

## 2021-12-24 MED ORDER — METHOCARBAMOL 500 MG PO TABS: 750.0000 mg | ORAL_TABLET | Freq: Three times a day (TID) | ORAL | Status: DC

## 2021-12-24 MED ORDER — METHOCARBAMOL 500 MG PO TABS
750.0000 mg | ORAL_TABLET | Freq: Three times a day (TID) | ORAL | Status: DC | PRN
  Administered 2021-12-25 – 2021-12-27 (×4): 750 mg via ORAL
  Filled 2021-12-24 (×4): qty 2

## 2021-12-24 MED ORDER — SODIUM CHLORIDE 0.9 % IV SOLN
250.0000 mg | Freq: Every day | INTRAVENOUS | Status: AC
  Administered 2021-12-24 – 2021-12-25 (×2): 250 mg via INTRAVENOUS
  Filled 2021-12-24 (×2): qty 20

## 2021-12-24 NOTE — Plan of Care (Signed)
  Problem: Education: Goal: Knowledge of General Education information will improve Description: Including pain rating scale, medication(s)/side effects and non-pharmacologic comfort measures Outcome: Progressing   Problem: Health Behavior/Discharge Planning: Goal: Ability to manage health-related needs will improve Outcome: Progressing   Problem: Clinical Measurements: Goal: Respiratory complications will improve Outcome: Progressing   Problem: Activity: Goal: Risk for activity intolerance will decrease Outcome: Progressing   Problem: Nutrition: Goal: Adequate nutrition will be maintained Outcome: Progressing   Problem: Pain Managment: Goal: General experience of comfort will improve Outcome: Progressing   Problem: Safety: Goal: Ability to remain free from injury will improve Outcome: Progressing   Problem: Skin Integrity: Goal: Risk for impaired skin integrity will decrease Outcome: Progressing   

## 2021-12-24 NOTE — Plan of Care (Signed)
?  Problem: Education: ?Goal: Knowledge of General Education information will improve ?Description: Including pain rating scale, medication(s)/side effects and non-pharmacologic comfort measures ?Outcome: Progressing ?  ?Problem: Health Behavior/Discharge Planning: ?Goal: Ability to manage health-related needs will improve ?Outcome: Progressing ?  ?Problem: Clinical Measurements: ?Goal: Ability to maintain clinical measurements within normal limits will improve ?Outcome: Progressing ?Goal: Will remain free from infection ?Outcome: Progressing ?Goal: Diagnostic test results will improve ?Outcome: Progressing ?Goal: Respiratory complications will improve ?Outcome: Progressing ?Goal: Cardiovascular complication will be avoided ?Outcome: Progressing ?  ?Problem: Activity: ?Goal: Risk for activity intolerance will decrease ?Outcome: Progressing ?  ?Problem: Nutrition: ?Goal: Adequate nutrition will be maintained ?Outcome: Progressing ?  ?Problem: Elimination: ?Goal: Will not experience complications related to bowel motility ?Outcome: Progressing ?Goal: Will not experience complications related to urinary retention ?Outcome: Progressing ?  ?Problem: Pain Managment: ?Goal: General experience of comfort will improve ?Outcome: Not Progressing ?  ?Problem: Safety: ?Goal: Ability to remain free from injury will improve ?Outcome: Progressing ?  ?Problem: Skin Integrity: ?Goal: Risk for impaired skin integrity will decrease ?Outcome: Progressing ?  ?

## 2021-12-24 NOTE — Progress Notes (Signed)
?PROGRESS NOTE ? ? ? ?[name]  [id number] DOB: 10-13-1960 DOA: 12/20/2021 ?PCP: Shelda Pal, DO  ?Narrative ?61 y.o. 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  Reportedly she has not been taking Lasix due to desire to avoid frequent trips to the bathroom. Seen in the ED -afebrile with leukocytosis (WBC 10.7k), hypoglycemic, appeared grossly volume overloaded and admitted.  She is being managed for acute on chronic HF PEF with left leg cellulitis pressure ulcers hyperkalemia, lactic acidosis.  ?  ? ?Subjective: Bothered by severe cramps secondary to Lasix, Robaxin helped her last night ? ?Assessment and Plan: ? ?Acute on chronic diastolic CHF (congestive heart failure) ([place]) ?-2D echo EF 55 to 60% poor acoustic windows due to body habitus ?-Patient admits to poor compliance with diuretics at home ?-Volume status improving, she is 6.2 L negative, hold additional IV Lasix today with bump in creatinine ?-Add Aldactone if kidney function stabilizes, poor candidate for Farxiga on account of UTI risk ?-BMP in a.m. ? ?Left leg cellulitis ?-Continue IV ceftriaxone, local wound care ? ?Hypothyroidism ?TSH elevated at 9.3, she is supposedly on 200 mcg of levothyroxine at baseline, doubt compliance as noted with other meds to, will repeat TSH ? ?Hyperkalemia ?-Resolved ? ?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[place]) ?No signs of acute flare ?-Continue hydroxychloroquine.  ? ?Diabetes mellitus type 2 in obese [facility]) ?-CBG stable now, continue current dose of Semglee, meal coverage and sliding  scale ? ?NAFLD (nonalcoholic fatty liver disease) ?No signs of decompensated liver disease. ?-Needs to lose weight ? ?CAD S/P percutaneous coronary angioplasty ?No chest pain, continue blood pressure control.  ? ?Hx of AKA (above knee amputation), right ([place]) ?Continue PT and OT ? ?Iron deficiency anemia ?Iron level with serum iron of 24, transferring saturation of 5 with ferritin of 7 and TIBC 462 ?Consistent in iron deficiency anemia ?-Given 2 doses of IV iron this admission, will add tomorrow ? ?Class 3 obesity (HCC) ?Calculated BMI is 53,7  ? ?DVT prophylaxis: Lovenox ?Code Status: Full code ?Family Communication: Discussed with patient in detail, no family at bedside ?Disposition Plan: Home in 1 to 2 days ? ? ?Procedures:  ? ?Antimicrobials:  ? ? ?Objective: ?Vitals:  ? 12/23/21 2102 12/24/21 [id number] 12/24/21 [id number] 12/24/21 0914  ?BP:  99/62  (!) 142/81  ?Pulse:  83  87  ?Resp:  16  16  ?Temp:  97.8 ?F (36.6 ?C)  97.8 ?F (36.6 ?C)  ?TempSrc:    Oral  ?SpO2: 96% 94% 95% 96%  ?Weight:      ?Height:      ? ? ?Intake/Output Summary (Last 24 hours) at 12/24/2021 1305 ?Last data filed at 12/24/2021 [id number] ?Gross per 24 hour  ?Intake 410 ml  ?Output 600 ml  ?Net -190 ml  ? ?Filed Weights  ? 12/22/21 1100 12/23/21 0500  ?Weight: 132.8 kg 133.2 kg  ? ? ?Examination: ? ?General exam: Appears calm and comfortable  ?Respiratory system: Clear to auscultation ?Cardiovascular system: S1 & S2 heard, RRR.  ?Abd: nondistended, soft and nontender.Normal bowel sounds heard. ?Central nervous system: Alert and oriented. No focal neurological deficits. ?Extremities:  Sno edema ?Skin: No rashes ?Psychiatry: Judgement and insight appear normal. Mood & affect appropriate.  ? ? ? ?Data Reviewed:  ? ?CBC: ?Recent Labs  ?Lab 12/20/21 ?1230 12/21/21 ?[id number] 12/22/21 ?0304 12/23/21 ?0235  ?WBC 10.7* 10.3 8.7 10.8*  ?NEUTROABS 5.8  --   --   --   ?HGB 9.1* 8.7* 8.6* 8.8*  ?HCT 33.0* 31.7* 30.0* 31.0*  ?MCV 72.7* 71.9* 69.8* 70.3*  ?PLT 310 306 302 336   ? ?Basic Metabolic Panel: ?Recent Labs  ?Lab 12/20/21 ?1230 12/21/21 ?[id number] 12/22/21 ?0304 12/23/21 ?[id number] 12/24/21 ?0250  ?NA 140 134* 135 133* 135  ?K 4.4 5.2* 4.3 4.3 4.2  ?CL 104 101 99 94* 95*  ?CO2 29 24 29 31 28   ?GLUCOSE 65* 310* 221* 246* 224*  ?BUN 18 18 14  21* 28*  ?CREATININE 0.93 0.94 0.80 1.04* 1.43*  ?CALCIUM 8.8* 8.6* 8.6* 8.8* 8.9  ?MG  --   --   --   --  1.8  ? ?GFR: ?Estimated Creatinine Clearance: 55 mL/min (A) (by C-G formula based on SCr of 1.43 mg/dL (H)). ?Liver Function Tests: ?Recent Labs  ?Lab 12/20/21 ?1230 12/23/21 ?0235  ?AST 26 37  ?ALT 24 26  ?ALKPHOS 84 87  ?BILITOT 0.4 0.3  ?PROT 7.0 6.9  ?ALBUMIN 3.1* 3.0*  ? ?No results for input(s): LIPASE, AMYLASE in the last 168 hours. ?No results for input(s): AMMONIA in the last 168 hours. ?Coagulation Profile: ?No results for input(s): INR, PROTIME in the last 168 hours. ?Cardiac Enzymes: ?No results for input(s): CKTOTAL, CKMB, CKMBINDEX, TROPONINI in the last 168 hours. ?BNP (last 3 results) ?No results for input(s): PROBNP in the last 8760 hours. ?HbA1C: ?Recent Labs  ?  12/22/21 ?0304  ?HGBA1C 8.5*  ? ?CBG: ?Recent Labs  ?Lab 12/23/21 ?1138 12/23/21 ?1631 12/23/21 ?2106 12/24/21 ?0725 12/24/21 ?1119  ?GLUCAP 298* 143* 194* 208* 167*  ? ?Lipid Profile: ?No results for input(s): CHOL, HDL, LDLCALC, TRIG, CHOLHDL, LDLDIRECT in the last 72 hours. ?Thyroid Function Tests: ?Recent Labs  ?  12/22/21 ?0304  ?TSH 9.386*  ? ?Anemia Panel: ?Recent Labs  ?  12/22/21 ?0304  ?VITAMINB12 1,150*  ?FOLATE 48.0  ?FERRITIN 7*  ?TIBC 462*  ?IRON 24*  ? ?Urine analysis: ?   ?Component Value Date/Time  ? [place] YELLOW 06/21/2021 0855  ? APPEARANCEUR CLOUDY (A) 06/21/2021 0855  ? LABSPEC 1.005 06/21/2021 0855  ? PHURINE 5.0 06/21/2021 0855  ? [place] NEGATIVE 06/21/2021 0855  ? [place] NEGATIVE 06/21/2021 0855  ? [place] NEGATIVE 06/21/2021 0855  ? [place] NEGATIVE 06/21/2021 0855  ? [place] NEGATIVE 06/21/2021 0855  ? UROBILINOGEN 0.2 02/10/2015  1835  ? NITRITE NEGATIVE 06/21/2021 0855  ? LEUKOCYTESUR LARGE (A) 06/21/2021 0855  ? ?Sepsis Labs: ?@LABRCNTIP (procalcitonin:4,lacticidven:4) ? ?) ?Recent Results (from the past 240 hour(s))  ?Group A Strep by PCR     Status: None  ? Collection Time: 12/21/21  3:45 AM  ? Specimen: Throat; Sterile Swab  ?Result Value Ref Range Status  ? Group A Strep by PCR NOT DETECTED NOT DETECTED Final  ?  Comment: Performed at [facility], [place] [address] [place], [place] [address]  ?Culture, blood (routine x 2)     Status: None (Preliminary result)  ? Collection Time: 12/21/21  8:45 AM  ? Specimen: BLOOD  ?Result Value Ref Range Status  ? Specimen Description BLOOD LEFT ANTECUBITAL  Final  ? Special Requests   Final  ?  BOTTLES DRAWN AEROBIC AND ANAEROBIC Blood Culture adequate  volume  ? Culture   Final  ?  NO GROWTH 3 DAYS ?Performed at [facility], [place] [address] [place], [place] [address] ?  ? Report Status PENDING  Incomplete  ?Culture, blood (routine x 2)     Status: None (Preliminary result)  ? Collection Time: 12/21/21  8:50 AM  ? Specimen: BLOOD RIGHT HAND  ?Result Value Ref Range Status  ? Specimen Description BLOOD RIGHT HAND  Final  ? Special Requests   Final  ?  BOTTLES DRAWN AEROBIC AND ANAEROBIC Blood Culture adequate volume  ? Culture   Final  ?  NO GROWTH 3 DAYS ?Performed at [facility], [place] [address], [place], [place] [address] ?  ? Report Status PENDING  Incomplete  ?  ? ?Radiology Studies: ?No results found. ? ? ?Scheduled Meds: ? aspirin EC  81 mg Oral QPM  ? diclofenac Sodium  4 g Topical QID  ? enoxaparin (LOVENOX) injection  40 mg Subcutaneous Q24H  ? ezetimibe  10 mg Oral Daily  ? fluticasone  1 spray Each Nare Daily  ? furosemide  40 mg Intravenous BID  ? gabapentin  400 mg Oral Daily  ? And  ? gabapentin  800 mg Oral QHS  ? hydrocortisone cream   Topical BID  ? hydroxychloroquine  200 mg Oral BID  ? insulin aspart  0-20 Units Subcutaneous TID WC  ? insulin aspart  0-5 Units  Subcutaneous QHS  ? insulin aspart  4 Units Subcutaneous TID WC  ? insulin glargine-yfgn  15 Units Subcutaneous Daily  ? levothyroxine  200 mcg Oral QAC breakfast  ? loratadine  10 mg Oral QPM  ? mometasone-formotero

## 2021-12-25 DIAGNOSIS — I5033 Acute on chronic diastolic (congestive) heart failure: Secondary | ICD-10-CM | POA: Diagnosis not present

## 2021-12-25 LAB — CBC
HCT: 33.2 % — ABNORMAL LOW (ref 36.0–46.0)
Hemoglobin: 9.4 g/dL — ABNORMAL LOW (ref 12.0–15.0)
MCH: 19.9 pg — ABNORMAL LOW (ref 26.0–34.0)
MCHC: 28.3 g/dL — ABNORMAL LOW (ref 30.0–36.0)
MCV: 70.3 fL — ABNORMAL LOW (ref 80.0–100.0)
Platelets: 301 10*3/uL (ref 150–400)
RBC: 4.72 MIL/uL (ref 3.87–5.11)
RDW: 18.2 % — ABNORMAL HIGH (ref 11.5–15.5)
WBC: 7.1 10*3/uL (ref 4.0–10.5)
nRBC: 0 % (ref 0.0–0.2)

## 2021-12-25 LAB — BASIC METABOLIC PANEL
Anion gap: 10 (ref 5–15)
Anion gap: 12 (ref 5–15)
BUN: 26 mg/dL — ABNORMAL HIGH (ref 6–20)
BUN: 22 mg/dL — ABNORMAL HIGH (ref 6–20)
CO2: 28 mmol/L (ref 22–32)
CO2: 27 mmol/L (ref 22–32)
Calcium: 9 mg/dL (ref 8.9–10.3)
Calcium: 9 mg/dL (ref 8.9–10.3)
Chloride: 97 mmol/L — ABNORMAL LOW (ref 98–111)
Chloride: 95 mmol/L — ABNORMAL LOW (ref 98–111)
Creatinine, Ser: 1.01 mg/dL — ABNORMAL HIGH (ref 0.44–1.00)
Creatinine, Ser: 0.94 mg/dL (ref 0.44–1.00)
GFR, Estimated: >60 mL/min (ref >60)
GFR, Estimated: >60 mL/min (ref >60)
Glucose, Bld: 223 mg/dL — ABNORMAL HIGH (ref 70–99)
Glucose, Bld: 264 mg/dL — ABNORMAL HIGH (ref 70–99)
Potassium: 4.1 mmol/L (ref 3.5–5.1)
Potassium: 4 mmol/L (ref 3.5–5.1)
Sodium: 135 mmol/L (ref 135–145)
Sodium: 134 mmol/L — ABNORMAL LOW (ref 135–145)

## 2021-12-25 LAB — GLUCOSE, CAPILLARY
Glucose-Capillary: 239 mg/dL — ABNORMAL HIGH (ref 70–99)
Glucose-Capillary: 229 mg/dL — ABNORMAL HIGH (ref 70–99)
Glucose-Capillary: 293 mg/dL — ABNORMAL HIGH (ref 70–99)
Glucose-Capillary: 303 mg/dL — ABNORMAL HIGH (ref 70–99)

## 2021-12-25 MED ORDER — INSULIN GLARGINE-YFGN 100 UNIT/ML ~~LOC~~ SOLN
18.0000 [IU] | Freq: Every day | SUBCUTANEOUS | Status: DC
  Administered 2021-12-25 – 2021-12-26 (×2): 18 [IU] via SUBCUTANEOUS
  Filled 2021-12-25 (×2): qty 0.18

## 2021-12-25 MED ORDER — ALBUTEROL SULFATE HFA 108 (90 BASE) MCG/ACT IN AERS: 2.0000 | INHALATION_SPRAY | RESPIRATORY_TRACT | Status: DC | PRN

## 2021-12-25 MED ORDER — MAGNESIUM SULFATE 2 GM/50ML IV SOLN
2.0000 g | Freq: Once | INTRAVENOUS | Status: AC
  Administered 2021-12-25: 2 g via INTRAVENOUS
  Filled 2021-12-25: qty 50

## 2021-12-25 MED ORDER — INSULIN ASPART 100 UNIT/ML IJ SOLN
5.0000 [IU] | Freq: Three times a day (TID) | INTRAMUSCULAR | Status: DC
  Administered 2021-12-25 – 2021-12-26 (×5): 5 [IU] via SUBCUTANEOUS

## 2021-12-25 MED ORDER — POTASSIUM CHLORIDE CRYS ER 20 MEQ PO TBCR
40.0000 meq | EXTENDED_RELEASE_TABLET | Freq: Once | ORAL | Status: AC
  Administered 2021-12-25: 40 meq via ORAL
  Filled 2021-12-25: qty 2

## 2021-12-25 MED ORDER — OXYCODONE HCL 5 MG PO TABS
20.0000 mg | ORAL_TABLET | Freq: Three times a day (TID) | ORAL | Status: DC | PRN
Start: 2021-12-25 — End: 2021-12-27
  Administered 2021-12-25 – 2021-12-26 (×3): 20 mg via ORAL
  Administered 2021-12-26: 5 mg via ORAL
  Administered 2021-12-26: 20 mg via ORAL
  Administered 2021-12-26: 15 mg via ORAL
  Filled 2021-12-25 (×6): qty 4

## 2021-12-25 MED ORDER — FUROSEMIDE 10 MG/ML IJ SOLN
60.0000 mg | Freq: Two times a day (BID) | INTRAMUSCULAR | Status: AC
  Administered 2021-12-25 (×2): 60 mg via INTRAVENOUS
  Filled 2021-12-25 (×2): qty 6

## 2021-12-25 MED ORDER — SPIRONOLACTONE 25 MG PO TABS
25.0000 mg | ORAL_TABLET | Freq: Every day | ORAL | Status: DC
  Administered 2021-12-25 – 2021-12-27 (×3): 25 mg via ORAL
  Filled 2021-12-25 (×3): qty 1

## 2021-12-25 NOTE — Progress Notes (Signed)
?PROGRESS NOTE ? ? ? ?[name]  [id number] DOB: [birth year] DOA: 12/20/2021 ?PCP: Shelda Pal, DO  ?Narrative ?61 y.o. 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  Reportedly she has not been taking Lasix due to desire to avoid frequent trips to the bathroom. Seen in the ED -afebrile with leukocytosis (WBC 10.7k), hypoglycemic, appeared grossly volume overloaded and admitted.  She is being managed for acute on chronic HFpEF with left leg cellulitis pressure ulcers hyperkalemia, lactic acidosis.  ?  ? ?Subjective: Feels better today ? ?Assessment and Plan: ? ?Acute on chronic diastolic CHF (congestive heart failure) ([place]) ?-2D echo EF 55 to 60% poor acoustic windows due to body habitus ?-Patient admits to poor compliance with diuretics at home ?-Volume status improving, she is 6.2 L negative yesterday, inaccurate last 24 hours ?-Continue IV Lasix today, add Aldactone ?-poor candidate for Farxiga on account of UTI risk ?-BMP in a.m. ? ?Left leg cellulitis ?-Continue IV ceftriaxone, local wound care ? ?Hypothyroidism ?TSH elevated at 9.3, she is supposedly on 200 mcg of levothyroxine at baseline, doubt compliance as noted with other meds to, will repeat TSH tomorrow ? ?Hyperkalemia ?-Resolved ? ?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[place]) ?No signs of acute flare ?-Continue hydroxychloroquine.  ? ?Diabetes mellitus type 2 in obese [facility]) ?-CBG stable elevated, will increase Semglee dose ? ?NAFLD (nonalcoholic fatty liver disease) ?No signs of decompensated liver disease. ?-Needs to lose  weight ? ?CAD S/P percutaneous coronary angioplasty ?No chest pain, continue blood pressure control.  ? ?Hx of AKA (above knee amputation), right ([place]) ?Continue PT and OT ? ?Iron deficiency anemia ?Iron level with serum iron of 24, transferring saturation of 5 with ferritin of 7 and TIBC 462 ?Consistent in iron deficiency anemia ?-Given 2 doses of IV iron this admission, restarted ? ?Class 3 obesity (HCC) ?Calculated BMI is 53,7  ? ?DVT prophylaxis: Lovenox ?Code Status: Full code ?Family Communication: Discussed with patient in detail, no family at bedside ?Disposition Plan: Home in 1 to 2 days ? ? ?Procedures:  ? ?Antimicrobials:  ? ? ?Objective: ?Vitals:  ? 12/25/21 0434 12/25/21 0536 12/25/21 0854 12/25/21 0900  ?BP: (!) 141/64  128/60   ?Pulse: 92  90   ?Resp: 18  16   ?Temp: 98.1 ?F (36.7 ?C)  97.9 ?F (36.6 ?C)   ?TempSrc: Oral  Oral   ?SpO2: 90%  90% 90%  ?Weight:  133.5 kg    ?Height:  5\' 2"  (1.575 m)    ? ? ?Intake/Output Summary (Last 24 hours) at 12/25/2021 1158 ?Last data filed at 12/25/2021 [id number] ?Gross per 24 hour  ?Intake 2060 ml  ?Output 650 ml  ?Net 1410 ml  ? ?Filed Weights  ? 12/22/21 1100 12/23/21 0500 12/25/21 0536  ?Weight: 132.8 kg 133.2 kg 133.5 kg  ? ? ?Examination: ? ?General exam: Obese female sitting up in recliner, AAOx3, no distress ?HEENT: Neck obese unable to assess JVD ?CVS: S1-S2, regular rhythm ?Lungs: Poor air movement, few basilar rales ?Abdomen: Soft, obese, nontender, abdominal wall edema, bowel sounds present ?Extremities: 2+ edema extending all the way to the upper thigh, right BKA, ?  Skin: Left leg with redness erythema involving most lower leg ?Psychiatry: Judgement and insight appear normal. Mood & affect appropriate.  ? ? ? ?Data Reviewed:  ? ?CBC: ?Recent Labs  ?Lab 12/20/21 ?1230 12/21/21 ?[id number] 12/22/21 ?0304 12/23/21 ?[id number] 12/25/21 ?0501  ?WBC 10.7* 10.3 8.7 10.8* 7.1  ?NEUTROABS 5.8  --   --   --   --   ?HGB 9.1* 8.7* 8.6* 8.8* 9.4*  ?HCT 33.0* 31.7* 30.0* 31.0* 33.2*   ?MCV 72.7* 71.9* 69.8* 70.3* 70.3*  ?PLT 310 306 302 336 301  ? ?Basic Metabolic Panel: ?Recent Labs  ?Lab 12/21/21 ?[id number] 12/22/21 ?0304 12/23/21 ?[id number] 12/24/21 ?0250 12/25/21 ?0501  ?NA 134* 135 133* 135 135  ?K 5.2* 4.3 4.3 4.2 4.1  ?CL 101 99 94* 95* 97*  ?CO2 24 29 31 28 28   ?GLUCOSE 310* 221* 246* 224* 223*  ?BUN 18 14 21* 28* 26*  ?CREATININE 0.94 0.80 1.04* 1.43* 1.01*  ?CALCIUM 8.6* 8.6* 8.8* 8.9 9.0  ?MG  --   --   --  1.8  --   ? ?GFR: ?Estimated Creatinine Clearance: 78.1 mL/min (A) (by C-G formula based on SCr of 1.01 mg/dL (H)). ?Liver Function Tests: ?Recent Labs  ?Lab 12/20/21 ?1230 12/23/21 ?0235  ?AST 26 37  ?ALT 24 26  ?ALKPHOS 84 87  ?BILITOT 0.4 0.3  ?PROT 7.0 6.9  ?ALBUMIN 3.1* 3.0*  ? ?No results for input(s): LIPASE, AMYLASE in the last 168 hours. ?No results for input(s): AMMONIA in the last 168 hours. ?Coagulation Profile: ?No results for input(s): INR, PROTIME in the last 168 hours. ?Cardiac Enzymes: ?No results for input(s): CKTOTAL, CKMB, CKMBINDEX, TROPONINI in the last 168 hours. ?BNP (last 3 results) ?No results for input(s): PROBNP in the last 8760 hours. ?HbA1C: ?No results for input(s): HGBA1C in the last 72 hours. ? ?CBG: ?Recent Labs  ?Lab 12/24/21 ?1119 12/24/21 ?1544 12/24/21 ?2033 12/25/21 ?0714 12/25/21 ?1149  ?GLUCAP 167* 213* 259* 239* 229*  ? ?Lipid Profile: ?No results for input(s): CHOL, HDL, LDLCALC, TRIG, CHOLHDL, LDLDIRECT in the last 72 hours. ?Thyroid Function Tests: ?No results for input(s): TSH, T4TOTAL, FREET4, T3FREE, THYROIDAB in the last 72 hours. ? ?Anemia Panel: ?No results for input(s): VITAMINB12, FOLATE, FERRITIN, TIBC, IRON, RETICCTPCT in the last 72 hours. ? ?Urine analysis: ?   ?Component Value Date/Time  ? [place] YELLOW 06/21/2021 0855  ? APPEARANCEUR CLOUDY (A) 06/21/2021 0855  ? LABSPEC 1.005 06/21/2021 0855  ? PHURINE 5.0 06/21/2021 0855  ? [place] NEGATIVE 06/21/2021 0855  ? [place] NEGATIVE 06/21/2021 0855  ? [place] NEGATIVE 06/21/2021  0855  ? [place] NEGATIVE 06/21/2021 0855  ? [place] NEGATIVE 06/21/2021 0855  ? UROBILINOGEN 0.2 02/10/2015 1835  ? NITRITE NEGATIVE 06/21/2021 0855  ? LEUKOCYTESUR LARGE (A) 06/21/2021 0855  ? ?Sepsis Labs: ?@[email or web address] (procalcitonin:4,lacticidven:4) ? ?) ?Recent Results (from the past 240 hour(s))  ?Group A Strep by PCR     Status: None  ? Collection Time: 12/21/21  3:45 AM  ? Specimen: Throat; Sterile Swab  ?Result Value Ref Range Status  ? Group A Strep by PCR NOT DETECTED NOT DETECTED Final  ?  Comment: Performed at [facility], [place] [address] [place], [place] [address]  ?Culture, blood (routine x 2)     Status: None (Preliminary result)  ? Collection Time: 12/21/21  8:45 AM  ? Specimen: BLOOD  ?Result Value Ref Range Status  ? Specimen Description BLOOD LEFT ANTECUBITAL  Final  ? Special Requests   Final  ?  BOTTLES DRAWN AEROBIC AND ANAEROBIC Blood Culture adequate volume  ? Culture   Final  ?  NO GROWTH 4 DAYS ?Performed at [facility], [place] [address], [place], [place] [address] ?  ? Report Status PENDING  Incomplete  ?Culture, blood (routine x 2)     Status: None (Preliminary result)  ? Collection Time: 12/21/21  8:50 AM  ? Specimen: BLOOD RIGHT HAND  ?Result Value Ref Range Status  ? Specimen Description BLOOD RIGHT HAND  Final  ? Special Requests   Final  ?  BOTTLES DRAWN AEROBIC AND ANAEROBIC Blood Culture adequate volume  ? Culture   Final  ?  NO GROWTH 4 DAYS ?Performed at [facility], [place] [address] [place], [place] [address] ?  ? Report Status PENDING  Incomplete  ?  ? ?Radiology Studies: ?No results found. ? ? ?Scheduled Meds: ? aspirin EC  81 mg Oral QPM  ? diclofenac Sodium  4 g Topical QID  ? enoxaparin (LOVENOX) injection  40 mg Subcutaneous Q24H  ? ezetimibe  10 mg Oral Daily  ? fluticasone  1 spray Each Nare Daily  ? furosemide  60 mg Intravenous BID  ? gabapentin  400 mg Oral Daily  ? And  ? gabapentin  800 mg Oral QHS  ? hydrocortisone cream   Topical BID  ?  hydroxychloroquine  200 mg Oral BID  ? insulin aspart  0-20 Units Subcutaneous TID WC  ? insulin aspart  0-5 Units Subcutaneous QHS  ? insulin aspart  5 Units Subcutaneous TID WC  ? insulin glargine-yfgn  18 Units Subc

## 2021-12-25 NOTE — Plan of Care (Signed)
  Problem: Activity: Goal: Risk for activity intolerance will decrease Outcome: Progressing   Problem: Nutrition: Goal: Adequate nutrition will be maintained Outcome: Progressing   Problem: Elimination: Goal: Will not experience complications related to bowel motility Outcome: Progressing Goal: Will not experience complications related to urinary retention Outcome: Progressing   Problem: Pain Managment: Goal: General experience of comfort will improve Outcome: Progressing   Problem: Safety: Goal: Ability to remain free from injury will improve Outcome: Progressing   Problem: Skin Integrity: Goal: Risk for impaired skin integrity will decrease Outcome: Progressing   

## 2021-12-26 ENCOUNTER — Telehealth: Payer: 59

## 2021-12-26 DIAGNOSIS — I5033 Acute on chronic diastolic (congestive) heart failure: Secondary | ICD-10-CM | POA: Diagnosis not present

## 2021-12-26 LAB — CULTURE, BLOOD (ROUTINE X 2)
Culture: NO GROWTH
Culture: NO GROWTH
Special Requests: ADEQUATE
Special Requests: ADEQUATE

## 2021-12-26 LAB — GLUCOSE, CAPILLARY
Glucose-Capillary: 267 mg/dL — ABNORMAL HIGH (ref 70–99)
Glucose-Capillary: 284 mg/dL — ABNORMAL HIGH (ref 70–99)
Glucose-Capillary: 266 mg/dL — ABNORMAL HIGH (ref 70–99)
Glucose-Capillary: 307 mg/dL — ABNORMAL HIGH (ref 70–99)

## 2021-12-26 LAB — CBC
HCT: 30 % — ABNORMAL LOW (ref 36.0–46.0)
Hemoglobin: 8.8 g/dL — ABNORMAL LOW (ref 12.0–15.0)
MCH: 20.6 pg — ABNORMAL LOW (ref 26.0–34.0)
MCHC: 29.3 g/dL — ABNORMAL LOW (ref 30.0–36.0)
MCV: 70.1 fL — ABNORMAL LOW (ref 80.0–100.0)
Platelets: 294 10*3/uL (ref 150–400)
RBC: 4.28 MIL/uL (ref 3.87–5.11)
RDW: 18.5 % — ABNORMAL HIGH (ref 11.5–15.5)
WBC: 9.1 10*3/uL (ref 4.0–10.5)
nRBC: 0 % (ref 0.0–0.2)

## 2021-12-26 LAB — BASIC METABOLIC PANEL
Anion gap: 10 (ref 5–15)
BUN: 25 mg/dL — ABNORMAL HIGH (ref 6–20)
CO2: 28 mmol/L (ref 22–32)
Calcium: 9.2 mg/dL (ref 8.9–10.3)
Chloride: 98 mmol/L (ref 98–111)
Creatinine, Ser: 0.91 mg/dL (ref 0.44–1.00)
GFR, Estimated: >60 mL/min (ref >60)
Glucose, Bld: 281 mg/dL — ABNORMAL HIGH (ref 70–99)
Potassium: 4.4 mmol/L (ref 3.5–5.1)
Sodium: 136 mmol/L (ref 135–145)

## 2021-12-26 LAB — TSH: TSH: 9.663 u[IU]/mL — ABNORMAL HIGH (ref 0.350–4.500)

## 2021-12-26 MED ORDER — INSULIN GLARGINE-YFGN 100 UNIT/ML ~~LOC~~ SOLN
22.0000 [IU] | Freq: Every day | SUBCUTANEOUS | Status: DC
  Filled 2021-12-26: qty 0.22

## 2021-12-26 MED ORDER — PRAMIPEXOLE DIHYDROCHLORIDE 0.25 MG PO TABS
0.5000 mg | ORAL_TABLET | Freq: Three times a day (TID) | ORAL | Status: DC
  Administered 2021-12-26 (×3): 0.5 mg via ORAL
  Filled 2021-12-26 (×4): qty 2

## 2021-12-26 MED ORDER — FUROSEMIDE 10 MG/ML IJ SOLN
60.0000 mg | Freq: Two times a day (BID) | INTRAMUSCULAR | Status: AC
  Administered 2021-12-26 – 2021-12-27 (×2): 60 mg via INTRAVENOUS
  Filled 2021-12-26 (×2): qty 6

## 2021-12-26 MED ORDER — INSULIN ASPART 100 UNIT/ML IJ SOLN
6.0000 [IU] | Freq: Three times a day (TID) | INTRAMUSCULAR | Status: DC
  Administered 2021-12-26 – 2021-12-27 (×3): 6 [IU] via SUBCUTANEOUS

## 2021-12-26 MED ORDER — LEVOTHYROXINE SODIUM 75 MCG PO TABS
225.0000 ug | ORAL_TABLET | Freq: Every day | ORAL | Status: DC
  Administered 2021-12-27: 225 ug via ORAL
  Filled 2021-12-26: qty 3

## 2021-12-26 NOTE — Progress Notes (Signed)
?PROGRESS NOTE ? ? ? ?[name]  [id number] DOB: 1961/06/21 DOA: 12/20/2021 ?PCP: Shelda Pal, DO  ?Narrative ?61 y.o. 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  Reportedly she has not been taking Lasix due to desire to avoid frequent trips to the bathroom. Seen in the ED -afebrile with leukocytosis (WBC 10.7k), hypoglycemic, appeared grossly volume overloaded and admitted.  She is being managed for acute on chronic HFpEF with left leg cellulitis pressure ulcers hyperkalemia, lactic acidosis.  ? -Improving on diuretics and ceftriaxone for cellulitis ? ?Subjective: Feels better today ? ?Assessment and Plan: ? ?Acute on chronic diastolic CHF (congestive heart failure) ([place]) ?-2D echo EF 55 to 60% poor acoustic windows due to body habitus ?-Patient admits to poor compliance with diuretics at home ?-Volume status improving, she is 6.2 L negative 2 days ago, urine output not recorded in the last 48 hours, weight is improving ?-Continue IV Lasix, Aldactone, kidney function is stable ?-poor candidate for Farxiga on account of UTI risk ?-Increase activity as tolerated, PT eval, BMP in a.m. ? ?Left leg cellulitis ?-Continue IV ceftriaxone 1 more day, transition to p.o. ABX tomorrow, local wound care ? ?Hypothyroidism ?TSH elevated at 9.3, she is supposedly on 200 mcg of levothyroxine at baseline, doubt compliance as noted with other meds to, repeat TSH is also elevated at 9.6, will increase Synthroid to 225 mcg ? ?Hyperkalemia ?-Resolved ? ?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[place]) ?No signs of  acute flare ?-Continue hydroxychloroquine.  ? ?Diabetes mellitus type 2 in obese [facility]) ?-CBG stable elevated, will increase Semglee dose ? ?NAFLD (nonalcoholic fatty liver disease) ?No signs of decompensated liver disease. ?-Needs to lose weight ? ?CAD S/P percutaneous coronary angioplasty ?No chest pain, continue blood pressure control.  ? ?Hx of AKA (above knee amputation), right ([place]) ?Continue PT and OT ? ?Iron deficiency anemia ?Iron level with serum iron of 24, transferring saturation of 5 with ferritin of 7 and TIBC 462 ?Consistent in iron deficiency anemia ?-Given 2 doses of IV iron this admission, restarted ? ?Class 3 obesity (HCC) ?Calculated BMI is 53,7  ? ?DVT prophylaxis: Lovenox ?Code Status: Full code ?Family Communication: Discussed with patient in detail, no family at bedside ?Disposition Plan: Home in 1 to 2 days ? ? ?Procedures:  ? ?Antimicrobials:  ? ? ?Objective: ?Vitals:  ? 12/25/21 2129 12/26/21 0509 12/26/21 0545 12/26/21 0932  ?BP: 136/61 131/74  (!) 150/74  ?Pulse: 92 100  98  ?Resp: 18 18  18   ?Temp: 98.5 ?F (36.9 ?C) 98 ?F (36.7 ?C)  98.3 ?F (36.8 ?C)  ?TempSrc:      ?SpO2: 96% 97%  96%  ?Weight:   130.9 kg   ?Height:   5\' 2"  (1.575 m)   ? ? ?Intake/Output Summary (Last 24 hours) at 12/26/2021 1413 ?Last data filed at 12/26/2021 1300 ?Gross per 24 hour  ?Intake 1030 ml  ?Output 275 ml  ?Net 755 ml  ? ?Filed Weights  ? 12/23/21 0500 12/25/21 0536 12/26/21 0545  ?Weight: 133.2 kg 133.5 kg 130.9 kg  ? ? ?Examination: ? ?General exam: Obese chronically ill female sitting up  in the recliner, AAOx3, no distress ?HEENT: Neck obese unable to assess JVD ?CVS: S1-S2 obese female sitting up in recliner, AAOx3, no distress ?HEENT: Neck obese unable to assess JVD ?CVS: S1-S2, regular rhythm ?Lungs: Poor air movement, few basilar rales ?Abdomen: Soft, obese, nontender, abdominal wall edema, bowel sounds present ?Extremities: 2+ edema extending all the way to the upper thigh, right BKA, ?Skin: Left leg  with redness erythema involving most lower leg ?Psychiatry: Judgement and insight appear normal. Mood & affect appropriate.  ? ? ? ?Data Reviewed:  ? ?CBC: ?Recent Labs  ?Lab 12/20/21 ?1230 12/21/21 ?[id number] 12/22/21 ?0304 12/23/21 ?[id number] 12/25/21 ?0501 12/26/21 ?0455  ?WBC 10.7* 10.3 8.7 10.8* 7.1 9.1  ?NEUTROABS 5.8  --   --   --   --   --   ?HGB 9.1* 8.7* 8.6* 8.8* 9.4* 8.8*  ?HCT 33.0* 31.7* 30.0* 31.0* 33.2* 30.0*  ?MCV 72.7* 71.9* 69.8* 70.3* 70.3* 70.1*  ?PLT 310 306 302 336 301 294  ? ?Basic Metabolic Panel: ?Recent Labs  ?Lab 12/23/21 ?[id number] 12/24/21 ?0250 12/25/21 ?0501 12/25/21 ?1326 12/26/21 ?0455  ?NA 133* 135 135 134* 136  ?K 4.3 4.2 4.1 4.0 4.4  ?CL 94* 95* 97* 95* 98  ?CO2 31 28 28 27 28   ?GLUCOSE 246* 224* 223* 264* 281*  ?BUN 21* 28* 26* 22* 25*  ?CREATININE 1.04* 1.43* 1.01* 0.94 0.91  ?CALCIUM 8.8* 8.9 9.0 9.0 9.2  ?MG  --  1.8  --   --   --   ? ?GFR: ?Estimated Creatinine Clearance: 85.5 mL/min (by C-G formula based on SCr of 0.91 mg/dL). ?Liver Function Tests: ?Recent Labs  ?Lab 12/20/21 ?1230 12/23/21 ?0235  ?AST 26 37  ?ALT 24 26  ?ALKPHOS 84 87  ?BILITOT 0.4 0.3  ?PROT 7.0 6.9  ?ALBUMIN 3.1* 3.0*  ? ?No results for input(s): LIPASE, AMYLASE in the last 168 hours. ?No results for input(s): AMMONIA in the last 168 hours. ?Coagulation Profile: ?No results for input(s): INR, PROTIME in the last 168 hours. ?Cardiac Enzymes: ?No results for input(s): CKTOTAL, CKMB, CKMBINDEX, TROPONINI in the last 168 hours. ?BNP (last 3 results) ?No results for input(s): PROBNP in the last 8760 hours. ?HbA1C: ?No results for input(s): HGBA1C in the last 72 hours. ? ?CBG: ?Recent Labs  ?Lab 12/25/21 ?1149 12/25/21 ?1548 12/25/21 ?2127 12/26/21 ?0724 12/26/21 ?1150  ?GLUCAP 229* 293* 303* 267* 284*  ? ?Lipid Profile: ?No results for input(s): CHOL, HDL, LDLCALC, TRIG, CHOLHDL, LDLDIRECT in the last 72 hours. ?Thyroid Function Tests: ?Recent Labs  ?  12/26/21 ?0455  ?TSH 9.663*  ? ? ?Anemia Panel: ?No results for  input(s): VITAMINB12, FOLATE, FERRITIN, TIBC, IRON, RETICCTPCT in the last 72 hours. ? ?Urine analysis: ?   ?Component Value Date/Time  ? [place] YELLOW 06/21/2021 0855  ? APPEARANCEUR CLOUDY (A) 06/21/2021 0855  ? LABSPEC 1.005 06/21/2021 0855  ? PHURINE 5.0 06/21/2021 0855  ? [place] NEGATIVE 06/21/2021 0855  ? [place] NEGATIVE 06/21/2021 0855  ? [place] NEGATIVE 06/21/2021 0855  ? [place] NEGATIVE 06/21/2021 0855  ? [place] NEGATIVE 06/21/2021 0855  ? UROBILINOGEN 0.2 02/10/2015 1835  ? NITRITE NEGATIVE 06/21/2021 0855  ? LEUKOCYTESUR LARGE (A) 06/21/2021 0855  ? ?Sepsis Labs: ?@[email or web address] (procalcitonin:4,lacticidven:4) ? ?) ?Recent Results (from the past 240 hour(s))  ?Group A Strep by PCR     Status: None  ? Collection Time: 12/21/21  3:45 AM  ? Specimen: Throat; Sterile Swab  ?Result Value Ref Range Status  ? Group A Strep by PCR NOT  DETECTED NOT DETECTED Final  ?  Comment: Performed at [facility], [place] [address] [place], [place] [address]  ?Culture, blood (routine x 2)     Status: None  ? Collection Time: 12/21/21  8:45 AM  ? Specimen: BLOOD  ?Result Value Ref Range Status  ? Specimen Description BLOOD LEFT ANTECUBITAL  Final  ? Special Requests   Final  ?  BOTTLES DRAWN AEROBIC AND ANAEROBIC Blood Culture adequate volume  ? Culture   Final  ?  NO GROWTH 5 DAYS ?Performed at [facility], [place] [address], [place], [place] [address] ?  ? Report Status 12/26/2021 FINAL  Final  ?Culture, blood (routine x 2)     Status: None  ? Collection Time: 12/21/21  8:50 AM  ? Specimen: BLOOD RIGHT HAND  ?Result Value Ref Range Status  ? Specimen Description BLOOD RIGHT HAND  Final  ? Special Requests   Final  ?  BOTTLES DRAWN AEROBIC AND ANAEROBIC Blood Culture adequate volume  ? Culture   Final  ?  NO GROWTH 5 DAYS ?Performed at [facility], [place] [address], [place], [place] [address] ?  ? Report Status 12/26/2021 FINAL  Final  ?  ? ?Radiology Studies: ?No results found. ? ? ?Scheduled Meds: ?  aspirin EC  81 mg Oral QPM  ? diclofenac Sodium  4 g Topical QID  ? enoxaparin (LOVENOX) injection  40 mg Subcutaneous Q24H  ? ezetimibe  10 mg Oral Daily  ? fluticasone  1 spray Each Nare Daily  ? gabapentin

## 2021-12-26 NOTE — Progress Notes (Signed)
Physical Therapy Treatment ?Patient Details ?Name: [name] ?MRN: [id number] ?DOB: 1961/01/16 ?Today's Date: 12/26/2021 ? ? ?History of Present Illness 61 y/o female presented to ED on 12/20/21 for L leg swelling, rash, and pain. Found to have L LE cellulitis. PMH includes asthma, CHF, CAD, HLD, HTN, DM2, obesity, connective tissue disease, chronic pain, R AKA ? ?  ?PT Comments  ? ? Continuing work on functional mobility and activity tolerance;  Pt able to stand from recliner to RW and take short hop-step walks with RW, 2 bouts of about 15 ft each; Pt indicated she hopes to be able to get a prosthesis, and she plans to reschedule her appointment with Ortho (missed it due to this hospitalization); We discussed the importance of stretching hip flexors and strengthening hip extensors in prep for a possible prosthesis; Recommend spending 20 minutes, 3x/day in prone position to help keep good length in hip flexors; Adding prone R hip extension exercise with also help with extension strength and flexor length; Performed modified bridges with RLE bolstered in recliner leaned back to near supine; Will continue to work on therex  ?Recommendations for follow up therapy are one component of a multi-disciplinary discharge planning process, led by the attending physician.  Recommendations may be updated based on patient status, additional functional criteria and insurance authorization. ? ?Follow Up Recommendations ? Home health PT ?  ?  ?Assistance Recommended at Discharge Intermittent Supervision/Assistance  ?Patient can return home with the following A little help with walking and/or transfers;A little help with bathing/dressing/bathroom;Assistance with cooking/housework ?  ?Equipment Recommendations ? None recommended by PT  ?  ?Recommendations for Other Services   ? ? ?  ?Precautions / Restrictions Precautions ?Precautions: Fall ?Precaution Comments: hx of R AKA, sacral wounds; fall risk is greatly reduced with use of  RW, and having the option for pt to sit close by  ?  ? ?Mobility ? Bed Mobility ?  ?  ?  ?  ?  ?  ?  ?General bed mobility comments: up in recliner on arrival ?  ? ?Transfers ?Overall transfer level: Needs assistance ?Equipment used: Rolling walker (2 wheels) ?Transfers: Sit to/from Stand ?Sit to Stand: Supervision ?  ?  ?  ?  ?  ?General transfer comment: supervision for safety; pt is aware of risk of RW moving when she pulls up on it, and chooses to pull up on it despite the risk; Noteworthy that she is able to get her weight on top of it, and the RW did not move when she stood x3 ?  ? ?Ambulation/Gait ?Ambulation/Gait assistance: Supervision ?Gait Distance (Feet): 15 Feet (x2; seated rest break in between) ?Assistive device: Rolling walker (2 wheels) ?Gait Pattern/deviations:  (hop to) ?  ?  ?  ?General Gait Details: supervision for safety; kept chair behind pt so that she can sit when needed; short distance amb, limited by back pain and L knee and hip pain/ache ? ? ?Stairs ?  ?  ?  ?  ?  ? ? ?Wheelchair Mobility ?  ? ?Modified Rankin (Stroke Patients Only) ?  ? ? ?  ?Balance   ?  ?Sitting balance-[name]: Fair ?  ?  ?  ?Standing balance-[name]: Poor ?Standing balance comment: reliant on RW ?  ?  ?  ?  ?  ?  ?  ?  ?  ?  ?  ?  ? ?  ?Cognition Arousal/Alertness: Awake/alert ?Behavior During Therapy: [facility] for tasks assessed/performed ?Overall Cognitive Status:  Within Functional Limits for tasks assessed ?  ?  ?  ?  ?  ?  ?  ?  ?  ?  ?  ?  ?  ?  ?  ?  ?  ?  ?  ? ?  ?Exercises Amputee Exercises ?Hip Extension: Right, Supine, 5 reps (with bolster under R residual limb for modified bridge type exercises) ? ?  ?General Comments General comments (skin integrity, edema, etc.): Educated pt re: desensistization technqiues to help with phantom pain, and exercises and positioning to stretch hip flexors and strengthen hip extensors in prep for possible prosthesis ?  ?  ? ?Pertinent Vitals/Pain Pain Assessment ?Pain  Assessment: Faces ?Faces Pain Scale: Hurts little more ?Pain Location: L knee and hip; low back pain as well ?Pain Descriptors / Indicators: Grimacing, Discomfort ?Pain Intervention(s): Monitored during session  ? ? ?Home Living   ?  ?  ?  ?  ?  ?  ?  ?  ?  ?   ?  ?Prior Function    ?  ?  ?   ? ?PT Goals (current goals can now be found in the care plan section) Acute Rehab PT Goals ?Patient Stated Goal: Wants to see about getting a prosthesis ?PT Goal Formulation: With patient ?Time For Goal Achievement: 01/04/22 ?Potential to Achieve Goals: Good ?Progress towards PT goals: Progressing toward goals ? ?  ?Frequency ? ? ? Min 3X/week ? ? ? ?  ?PT Plan Current plan remains appropriate  ? ? ?Co-evaluation   ?  ?  ?  ?  ? ?  ?AM-PAC PT "6 Clicks" Mobility   ?Outcome Measure ? Help needed turning from your back to your side while in a flat bed without using bedrails?: A Little ?Help needed moving from lying on your back to sitting on the side of a flat bed without using bedrails?: A Little ?Help needed moving to and from a bed to a chair (including a wheelchair)?: A Little ?Help needed standing up from a chair using your arms (e.g., wheelchair or bedside chair)?: A Little ?Help needed to walk in hospital room?: A Little ?Help needed climbing 3-5 steps with a railing? : Total ?6 Click Score: 16 ? ?  ?End of Session   ?Activity Tolerance: Patient tolerated treatment well ?Patient left: in chair;with call bell/phone within reach;with nursing/sitter in room ?Nurse Communication: Mobility status ?PT Visit Diagnosis: Unsteadiness on feet (R26.81);Muscle weakness (generalized) (M62.81);Other abnormalities of gait and mobility (R26.89) ?  ? ? ?Time: [id number] ?PT Time Calculation (min) (ACUTE ONLY): 20 min ? ?Charges:  $Gait Training: 8-22 mins          ?          ? ?Roney Marion, PT  ?Acute Rehabilitation Services ?Office [phone] ? ? ? ?[place] ?12/26/2021, 10:40 AM ? ?

## 2021-12-26 NOTE — Progress Notes (Signed)
Occupational Therapy Treatment ?Patient Details ?Name: [name] RouxDawn M [name] ?MRN: [id number] ?DOB: 07-Jan-1961 ?Today's Date: 12/26/2021 ? ? ?History of present illness 61 y/o female presented to ED on 12/20/21 for L leg swelling, rash, and pain. Found to have L LE cellulitis. PMH includes asthma, CHF, CAD, HLD, HTN, DM2, obesity, connective tissue disease, chronic pain, R AKA ?  ?OT comments ? [facility] and was able to perform toilet hygiene while seated [facility] with toilet aide and patient was supervision to hop back to recliner. Patient attempted grooming standing at sink but was reliant on BUE support and performed seated. Patient educated on use of sock aide for donning sock and patient was able to return demonstration with min assist. Patient attempted static standing with RW and tolerated 1.5 minutes of standing. Patient is making good gains and will continue to be followed by acute OT.   ? ?Recommendations for follow up therapy are one component of a multi-disciplinary discharge planning process, led by the attending physician.  Recommendations may be updated based on patient status, additional functional criteria and insurance authorization. ?   ?Follow Up Recommendations ? Home health OT  ?  ?Assistance Recommended at Discharge Intermittent Supervision/Assistance  ?Patient can return home with the following ? A little help with walking and/or transfers;A lot of help with bathing/dressing/bathroom;Assistance with cooking/housework ?  ?Equipment Recommendations ? None recommended by OT  ?  ?Recommendations for Other Services   ? ?  ?Precautions / Restrictions Precautions ?Precautions: Fall ?Precaution Comments: hx of R AKA, sacral wounds; fall risk is greatly reduced with use of RW, and having the option for pt to sit close by ?Restrictions ?Weight Bearing Restrictions: No  ? ? ?  ? ?Mobility Bed Mobility ?  ?  ?  ?  ?  ?  ?  ?General bed mobility comments: up in recliner on arrival ?   ? ?Transfers ?Overall transfer level: Needs assistance ?Equipment used: Rolling walker (2 wheels) ?Transfers: Sit to/from Stand ?Sit to Stand: Supervision ?  ?  ?  ?  ?  ?General transfer comment: supervision for safety with transfes using RW ?  ?  ?Balance Overall balance assessment: Needs assistance ?Sitting-balance support: No upper extremity supported, Feet supported ?Sitting balance-[name]: Fair ?  ?  ?Standing balance support: Bilateral upper extremity supported, Reliant on assistive device for balance ?Standing balance-[name]: Poor ?Standing balance comment: reliant on RW, able to tolerate 1.5 minutes of static standing with RW ?  ?  ?  ?  ?  ?  ?  ?  ?  ?  ?  ?   ? ?ADL either performed or assessed with clinical judgement  ? ?ADL Overall ADL's : Needs assistance/impaired ?  ?  ?Grooming: Independent;Sitting ?Grooming Details (indicate cue type and reason): attempted standing at sink but unable to safely perform grooming standing at this time due to BUE support ?  ?  ?  ?  ?  ?  ?Lower Body Dressing: Minimal assistance;Sitting/lateral leans ?Lower Body Dressing Details (indicate cue type and reason): education on sock aide use for donning sock ?Toilet Transfer: Supervision/safety;Ambulation;BSC/3in1;Rolling walker (2 wheels) ?Toilet Transfer Details (indicate cue type and reason): [facility] and back to recliner with RW ?Toileting- Clothing Manipulation and Hygiene: Supervision/safety;Sitting/lateral lean;With adaptive equipment ?Toileting - Clothing Manipulation Details (indicate cue type and reason): used toilet aide to assist with toilet hygiene ?  ?  ?  ?General ADL Comments: good understanding of sock aide  use for LB dressing ?  ? ?Extremity/Trunk Assessment   ?  ?  ?  ?  ?  ? ?Vision   ?  ?  ?Perception   ?  ?Praxis   ?  ? ?Cognition Arousal/Alertness: Awake/alert ?Behavior During Therapy: [facility] for tasks assessed/performed ?Overall Cognitive Status: Within Functional Limits for tasks  assessed ?  ?  ?  ?  ?  ?  ?  ?  ?  ?  ?  ?  ?  ?  ?  ?  ?General Comments: able to recall PT visit ?  ?  ?   ?Exercises   ? ?  ?Shoulder Instructions   ? ? ?  ?General Comments Educated pt re: desensistization technqiues to help with phantom pain, and exercises and positioning to stretch hip flexors and strengthen hip extensors in prep for possible prosthesis  ? ? ?Pertinent Vitals/ Pain       Pain Assessment ?Pain Assessment: Faces ?Faces Pain Scale: Hurts little more ?Pain Location: L knee and hip; low back pain as well ?Pain Descriptors / Indicators: Discomfort, Grimacing ?Pain Intervention(s): Monitored during session ? ?Home Living   ?  ?  ?  ?  ?  ?  ?  ?  ?  ?  ?  ?  ?  ?  ?  ?  ?  ?  ? ?  ?Prior Functioning/Environment    ?  ?  ?  ?   ? ?Frequency ? Min 2X/week  ? ? ? ? ?  ?Progress Toward Goals ? ?OT Goals(current goals can now be found in the care plan section) ? Progress towards OT goals: Progressing toward goals ? ?Acute Rehab OT Goals ?Patient Stated Goal: go home ?OT Goal Formulation: With patient ?Time For Goal Achievement: 01/04/22 ?Potential to Achieve Goals: Good ?ADL Goals ?Pt Will Perform Lower Body Bathing: with min guard assist;sitting/lateral leans;sit to/from stand ?Pt Will Perform Lower Body Dressing: with min guard assist;sitting/lateral leans;sit to/from stand ?Pt Will Transfer to Toilet: with min guard assist;stand pivot transfer ?Pt Will Perform Toileting - Clothing Manipulation and hygiene: with min guard assist;sitting/lateral leans;sit to/from stand ?Additional ADL Goal #1: Pt will tolerate standing ADL tasks for 3 mins with no rest break  ?Plan Discharge plan remains appropriate;Frequency remains appropriate   ? ?Co-evaluation ? ? ?   ?  ?  ?  ?  ? ?  ?AM-PAC OT "6 Clicks" Daily Activity     ?Outcome Measure ? ? Help from another person eating meals?: None ?Help from another person taking care of personal grooming?: None ?Help from another person toileting, which includes using  toliet, bedpan, or urinal?: A Lot ?Help from another person bathing (including washing, rinsing, drying)?: A Lot ?Help from another person to put on and taking off regular upper body clothing?: None ?Help from another person to put on and taking off regular lower body clothing?: A Lot ?6 Click Score: 18 ? ?  ?End of Session Equipment Utilized During Treatment: Rolling walker (2 wheels) ? ?OT Visit Diagnosis: Unsteadiness on feet (R26.81);Other abnormalities of gait and mobility (R26.89);Muscle weakness (generalized) (M62.81) ?  ?Activity Tolerance Patient tolerated treatment well ?  ?Patient Left in chair;with call bell/phone within reach ?  ?Nurse Communication Mobility status ?  ? ?   ? ?Time: 3818-2993 ?OT Time Calculation (min): 29 min ? ?Charges: OT General Charges ?$OT Visit: 1 Visit ?OT Treatments ?$Self Care/Home Management : 23-37 mins ? ?Alfonse Flavors, OTA ?Acute Rehabilitation Services  ?  Pager [phone] ?Office [phone] ? ? ?Ionna Avis Jeannett Senior ?12/26/2021, 1:47 PM ?

## 2021-12-26 NOTE — Plan of Care (Signed)
  Problem: Education: Goal: Knowledge of General Education information will improve Description: Including pain rating scale, medication(s)/side effects and non-pharmacologic comfort measures Outcome: Progressing   Problem: Health Behavior/Discharge Planning: Goal: Ability to manage health-related needs will improve Outcome: Progressing   Problem: Clinical Measurements: Goal: Ability to maintain clinical measurements within normal limits will improve Outcome: Progressing   Problem: Activity: Goal: Risk for activity intolerance will decrease Outcome: Progressing   Problem: Pain Managment: Goal: General experience of comfort will improve Outcome: Progressing   

## 2021-12-26 NOTE — Plan of Care (Signed)
  Problem: Education: Goal: Knowledge of General Education information will improve Description Including pain rating scale, medication(s)/side effects and non-pharmacologic comfort measures Outcome: Progressing   Problem: Health Behavior/Discharge Planning: Goal: Ability to manage health-related needs will improve Outcome: Progressing   Problem: Clinical Measurements: Goal: Ability to maintain clinical measurements within normal limits will improve Outcome: Progressing   Problem: Pain Managment: Goal: General experience of comfort will improve Outcome: Progressing   Problem: Skin Integrity: Goal: Risk for impaired skin integrity will decrease Outcome: Progressing   

## 2021-12-26 NOTE — Progress Notes (Signed)
Inpatient Diabetes Program Recommendations ? ?AACE/ADA: New Consensus Statement on Inpatient Glycemic Control (2015) ? ?Target Ranges:  Prepandial:   less than 140 mg/dL ?     Peak postprandial:   less than 180 mg/dL (1-2 hours) ?     Critically ill patients:  140 - 180 mg/dL  ? ?Lab Results  ?Component Value Date  ? GLUCAP 267 (H) 12/26/2021  ? HGBA1C 8.5 (H) 12/22/2021  ? ? ?Review of Glycemic Control ? Latest Reference Range & Units 12/25/21 07:14 12/25/21 11:49 12/25/21 15:48 12/25/21 21:27 12/26/21 07:24  ?Glucose-Capillary 70 - 99 mg/dL 161239 (H) 096229 (H) 045293 (H) 303 (H) 267 (H)  ? ?Diabetes history: DM2 ?Outpatient Diabetes medications: Humulin R U500 40-150 units TID with meals, Metformin XR 500 mg BID ?Current orders for Inpatient glycemic control:  ?Semglee 18 units daily ?Novolog 0-20 units TID with meals ?Novolog 5 units tid with meals ? ?Ensure max bid between meals ? ?Inpatient Diabetes Program Recommendations:   ? ?-  Consider increasing Semglee to 25 units daily.   ? ?Will follow.  ? ?Thanks,  ?Christena DeemShannon Tylyn Stankovich RN, MSN, BC-ADM ?Inpatient Diabetes Coordinator ?Team Pager [phone] (8a-5p) ? ? ?

## 2021-12-27 ENCOUNTER — Telehealth: Payer: 59

## 2021-12-27 ENCOUNTER — Encounter (HOSPITAL_COMMUNITY): Payer: Self-pay | Admitting: Internal Medicine

## 2021-12-27 DIAGNOSIS — I5033 Acute on chronic diastolic (congestive) heart failure: Secondary | ICD-10-CM | POA: Diagnosis not present

## 2021-12-27 LAB — CBC
HCT: 32.9 % — ABNORMAL LOW (ref 36.0–46.0)
Hemoglobin: 9.2 g/dL — ABNORMAL LOW (ref 12.0–15.0)
MCH: 20.2 pg — ABNORMAL LOW (ref 26.0–34.0)
MCHC: 28 g/dL — ABNORMAL LOW (ref 30.0–36.0)
MCV: 72.1 fL — ABNORMAL LOW (ref 80.0–100.0)
Platelets: 287 10*3/uL (ref 150–400)
RBC: 4.56 MIL/uL (ref 3.87–5.11)
RDW: 19.1 % — ABNORMAL HIGH (ref 11.5–15.5)
WBC: 10.4 10*3/uL (ref 4.0–10.5)
nRBC: 0.3 % — ABNORMAL HIGH (ref 0.0–0.2)

## 2021-12-27 LAB — BASIC METABOLIC PANEL
Anion gap: 12 (ref 5–15)
BUN: 22 mg/dL — ABNORMAL HIGH (ref 6–20)
CO2: 28 mmol/L (ref 22–32)
Calcium: 8.9 mg/dL (ref 8.9–10.3)
Chloride: 94 mmol/L — ABNORMAL LOW (ref 98–111)
Creatinine, Ser: 0.8 mg/dL (ref 0.44–1.00)
GFR, Estimated: >60 mL/min (ref >60)
Glucose, Bld: 268 mg/dL — ABNORMAL HIGH (ref 70–99)
Potassium: 3.8 mmol/L (ref 3.5–5.1)
Sodium: 134 mmol/L — ABNORMAL LOW (ref 135–145)

## 2021-12-27 LAB — GLUCOSE, CAPILLARY
Glucose-Capillary: 297 mg/dL — ABNORMAL HIGH (ref 70–99)
Glucose-Capillary: 439 mg/dL — ABNORMAL HIGH (ref 70–99)
Glucose-Capillary: 351 mg/dL — ABNORMAL HIGH (ref 70–99)

## 2021-12-27 MED ORDER — POT BICARB-POT CHLORIDE 25 MEQ PO TBEF
25.0000 meq | EFFERVESCENT_TABLET | Freq: Three times a day (TID) | ORAL | 0 refills | Status: DC | PRN
Start: 2021-12-27 — End: 2022-01-09

## 2021-12-27 MED ORDER — FUROSEMIDE 20 MG PO TABS: 40.0000 mg | ORAL_TABLET | Freq: Two times a day (BID) | ORAL | Status: DC

## 2021-12-27 MED ORDER — METHOCARBAMOL 750 MG PO TABS: 750.0000 mg | ORAL_TABLET | Freq: Three times a day (TID) | ORAL | 0 refills | Status: DC | PRN

## 2021-12-27 MED ORDER — INSULIN GLARGINE-YFGN 100 UNIT/ML ~~LOC~~ SOLN
25.0000 [IU] | Freq: Every day | SUBCUTANEOUS | Status: DC
  Administered 2021-12-27: 25 [IU] via SUBCUTANEOUS
  Filled 2021-12-27: qty 0.25

## 2021-12-27 MED ORDER — FUROSEMIDE 10 MG/ML IJ SOLN
60.0000 mg | Freq: Two times a day (BID) | INTRAMUSCULAR | Status: DC
  Administered 2021-12-27: 60 mg via INTRAVENOUS
  Filled 2021-12-27: qty 6

## 2021-12-27 MED ORDER — PRAMIPEXOLE DIHYDROCHLORIDE 1 MG PO TABS
1.0000 mg | ORAL_TABLET | Freq: Three times a day (TID) | ORAL | Status: DC
Start: 2021-12-27 — End: 2021-12-27
  Administered 2021-12-27: 1 mg via ORAL
  Filled 2021-12-27 (×3): qty 1

## 2021-12-27 MED ORDER — SPIRONOLACTONE 25 MG PO TABS: 25.0000 mg | ORAL_TABLET | Freq: Every day | ORAL | 0 refills | Status: DC

## 2021-12-27 NOTE — Progress Notes (Signed)
Heart Failure Nurse Navigator Progress Note ? ?PCP: Shelda Pal, DO ?PCP-Cardiologist: Radford Pax ?Admission Diagnosis: Cellulites of the left leg. ?Admitted from: PCP office ? ?Presentation:   ?[name] presented with left leg swelling, redness and tenderness. Cellulitis concern. Started about 2 weeks ago and has traveled up her leg. Drainage and LLE swelling. She has been seen in the past for wound care. Patient complained of dyspnea and more difficulty with sleeping at night due to shortness of breath. Stated she has missed some of her lasix medication because she hasn't been able to void and didn't want to move to much due to pain in leg. This aided in decrease urine output. Patient placed on IV lasix and is urinating frequently at this time, pain control is still a factor and is being manage with home regimen. IV antibiotics still being administered.  ? ?Educated in detail about the importance of taking all her medication as prescribed, Diet and fluid restrictions, Daily weights (has a scale at home) and when to call her doctor or come to the ER. We spoke in length about her desire to apply for disability, states she hasn't worked since 2003 and husband is self employed. She says her insurance up to this point has covered her medications well and if she has a issues she usually goes to [employer] to price and fill. Patient voiced her understanding and would like to come to [facility] for a follow up visit from the hospital. She is scheduled for 01/09/22 @ 9am and states her husband will drive her.  ? ?ECHO/ LVEF: 60-65% ? ?Clinical Course: ? ?Past Medical History:  ?Diagnosis Date  ? Aortic stenosis   ? mild AS by echo 02/2021  ? Asthma   ? "on daily RX and rescue inhaler" (04/18/2018)  ? Chronic diastolic CHF (congestive heart failure) ([place]) 09/2015  ? Chronic lower back pain   ? Chronic neck pain   ? Chronic pain syndrome   ? Fentanyl Patch  ? Colon polyps   ? Coronary artery disease   ? cath with normal  LM, 30% LAD, 85% mid RCA and 95% distal RCA s/p PCI of the mid to distal RCA and now on DAPT with ASA and Ticagrelor.    ? Eczema   ? Excessive daytime sleepiness 11/26/2015  ? Fibromyalgia   ? Gallstones   ? GERD (gastroesophageal reflux disease)   ? Heart murmur   ? "noted for the 1st time on 04/18/2018"  ? History of blood transfusion 07/2010  ? "S/P oophorectomy"  ? History of gout   ? History of hiatal hernia 1980s  ? "gone now" (04/18/2018)  ? Hyperlipidemia   ? Hypertension   ? takes Metoprolol and Enalapril daily  ? Hypothyroidism   ? takes Synthroid daily  ? IBS (irritable bowel syndrome)   ? Migraine   ? "nothing in the 2000s" (04/18/2018)  ? Mixed connective tissue disease ([place])   ? NAFLD (nonalcoholic fatty liver disease)   ? Pneumonia   ? "several times" (04/18/2018)  ? PVC's (premature ventricular contractions)   ? noted on event monitor 02/2021  ? Rheumatoid arthritis ([place])   ? "hands, elbows, shoulders, probably knees" (04/18/2018)  ? Scoliosis   ? Sleep apnea   ? mild - does not use cpap  ? Spondylosis   ? Type II diabetes mellitus ([place])   ? takes Metformin and Hum R daily (04/18/2018)  ? Walker as ambulation aid   ? also uses wheelchair  ?  ? ?  Social History  ? ?Socioeconomic History  ? Marital status: Married  ?  Spouse name: [name]  ? Number of children: 1  ? Years of education: 103  ? Highest education level: GED or equivalent  ?Occupational History  ?  Comment: Not currrently working ( last worked 2004)  ?Tobacco Use  ? Smoking status: Former  ?  Packs/day: 1.00  ?  Years: 19.00  ?  Pack years: 19.00  ?  Types: Cigarettes  ?  Start date: 76  ?  Quit date: 02/11/2004  ?  Years since quitting: 17.8  ?  Passive exposure: Past  ? Smokeless tobacco: Never  ?Vaping Use  ? Vaping Use: Never used  ?Substance and Sexual Activity  ? Alcohol use: Yes  ?  Comment: RARE Wine  ? Drug use: Not Currently  ?  Types: Marijuana  ?  Comment: "only in my teens"  ? Sexual activity: Yes  ?  Birth control/protection: Surgical   ?  Comment: Hysterectomy  ?Other Topics Concern  ? Not on file  ?Social History Narrative  ? Not on file  ? ?Social Determinants of Health  ? ?Financial Resource Strain: Low Risk   ? Difficulty of Paying Living Expenses: Not hard at all  ?Food Insecurity: No Food Insecurity  ? Worried About Charity fundraiser in the Last Year: Never true  ? Ran Out of Food in the Last Year: Never true  ?Transportation Needs: No Transportation Needs  ? Lack of Transportation (Medical): No  ? Lack of Transportation (Non-Medical): No  ?Physical Activity: Inactive  ? Days of Exercise per Week: 0 days  ? Minutes of Exercise per Session: 0 min  ?Stress: Stress Concern Present  ? Feeling of Stress : Very much  ?Social Connections: Moderately Integrated  ? Frequency of Communication with Friends and Family: More than three times a week  ? Frequency of Social Gatherings with Friends and Family: More than three times a week  ? Attends Religious Services: More than 4 times per year  ? Active Member of Clubs or Organizations: No  ? Attends Archivist Meetings: Never  ? Marital Status: Married  ? ? ?High Risk Criteria for Readmission and/or Poor Patient Outcomes: ?Heart failure hospital admissions (last 6 months):  0 ?No Show rate: 6 % ?Difficult social situation: Yes, Help starting disability paper work ?Demonstrates medication adherence: No ?Primary Language: English ?Literacy level: Reading, writing, comprehension. ? ?Barriers of Care:   ?Medication compliance ?Diet/Fluid restrictions ? ?Considerations/Referrals:  ? ?Referral made to Heart Failure Pharmacist Stewardship: yes, optimize medications ?Referral made to Heart Failure CSW/NCM TOC: Yes, help with starting disability paperwork.  ?Referral made to Heart & Vascular TOC clinic: Yes, 01/09/22 @ 9am.  ? ?Items for Follow-up on DC/TOC: ?Optimize  ?Medication compliance ?CSW:  Disability paperwork.  ? ? ?Earnestine Leys, BSN, RN ?Heart Failure Nurse Navigator ?Secure Chat Only   ?

## 2021-12-27 NOTE — Progress Notes (Signed)
Inpatient Diabetes Program Recommendations ? ?AACE/ADA: New Consensus Statement on Inpatient Glycemic Control (2015) ? ?Target Ranges:  Prepandial:   less than 140 mg/dL ?     Peak postprandial:   less than 180 mg/dL (1-2 hours) ?     Critically ill patients:  140 - 180 mg/dL  ? ?Lab Results  ?Component Value Date  ? GLUCAP 297 (H) 12/27/2021  ? HGBA1C 8.5 (H) 12/22/2021  ? ? ?Review of Glycemic Control ? Latest Reference Range & Units 12/26/21 07:24 12/26/21 11:50 12/26/21 16:45 12/26/21 21:55 12/27/21 07:36  ?Glucose-Capillary 70 - 99 mg/dL 338 (H) 250 (H) 539 (H) 307 (H) 297 (H)  ? ?Diabetes history: DM2 ?Outpatient Diabetes medications: Humulin R U500 40-150 units TID with meals, Metformin XR 500 mg BID ?Current orders for Inpatient glycemic control:  ?Semglee 25 units daily (increased today) ?Novolog 0-20 units TID with meals ?Novolog 6 units tid with meals ? ?Ensure max bid between meals ? ?Inpatient Diabetes Program Recommendations:   ? ?-  Consider increasing Novolog meal coverage to 10 units tid ? ?Will follow.  ? ?Thanks,  ?Christena Deem RN, MSN, BC-ADM ?Inpatient Diabetes Coordinator ?Team Pager [phone] (8a-5p) ? ? ?

## 2021-12-27 NOTE — Progress Notes (Signed)
Pt's Blood sugar when up to 400's this afternoon. Pt said she took her Ensure. MD was notified and ordered to give the maximum of sliding scale. Rechecked the blood sugar and it was 350's. Pt informed the RN that she was upset and she wants to go home as she was concern her blood sugar was high. MD was notified. ?

## 2021-12-27 NOTE — Progress Notes (Signed)
Heart Failure Navigation Team ?Progress Note ? ?PCP: Shelda Pal, DO ?Primary Cardiologist: Sueanne Margarita, MD ?Admitted from: home ? ?Past Medical History:  ?Diagnosis Date  ? Aortic stenosis   ? mild AS by echo 02/2021  ? Asthma   ? "on daily RX and rescue inhaler" (04/18/2018)  ? Chronic diastolic CHF (congestive heart failure) ([place]) 09/2015  ? Chronic lower back pain   ? Chronic neck pain   ? Chronic pain syndrome   ? Fentanyl Patch  ? Colon polyps   ? Coronary artery disease   ? cath with normal LM, 30% LAD, 85% mid RCA and 95% distal RCA s/p PCI of the mid to distal RCA and now on DAPT with ASA and Ticagrelor.    ? Eczema   ? Excessive daytime sleepiness 11/26/2015  ? Fibromyalgia   ? Gallstones   ? GERD (gastroesophageal reflux disease)   ? Heart murmur   ? "noted for the 1st time on 04/18/2018"  ? History of blood transfusion 07/2010  ? "S/P oophorectomy"  ? History of gout   ? History of hiatal hernia 1980s  ? "gone now" (04/18/2018)  ? Hyperlipidemia   ? Hypertension   ? takes Metoprolol and Enalapril daily  ? Hypothyroidism   ? takes Synthroid daily  ? IBS (irritable bowel syndrome)   ? Migraine   ? "nothing in the 2000s" (04/18/2018)  ? Mixed connective tissue disease ([place])   ? NAFLD (nonalcoholic fatty liver disease)   ? Pneumonia   ? "several times" (04/18/2018)  ? PVC's (premature ventricular contractions)   ? noted on event monitor 02/2021  ? Rheumatoid arthritis ([place])   ? "hands, elbows, shoulders, probably knees" (04/18/2018)  ? Scoliosis   ? Sleep apnea   ? mild - does not use cpap  ? Spondylosis   ? Type II diabetes mellitus ([place])   ? takes Metformin and Hum R daily (04/18/2018)  ? Walker as ambulation aid   ? also uses wheelchair  ? ? ?Social History  ? ?Socioeconomic History  ? Marital status: Married  ?  Spouse name: [name] Derwin  ? Number of children: 1  ? Years of education: 72  ? Highest education level: GED or equivalent  ?Occupational History  ?  Comment: Not currrently working ( last worked  2004)  ?Tobacco Use  ? Smoking status: Former  ?  Packs/day: 1.00  ?  Years: 19.00  ?  Pack years: 19.00  ?  Types: Cigarettes  ?  Start date: 44  ?  Quit date: 02/11/2004  ?  Years since quitting: 17.8  ?  Passive exposure: Past  ? Smokeless tobacco: Never  ?Vaping Use  ? Vaping Use: Never used  ?Substance and Sexual Activity  ? Alcohol use: Yes  ?  Comment: RARE Wine  ? Drug use: Not Currently  ?  Types: Marijuana  ?  Comment: "only in my teens"  ? Sexual activity: Yes  ?  Birth control/protection: Surgical  ?  Comment: Hysterectomy  ?Other Topics Concern  ? Not on file  ?Social History Narrative  ? Not on file  ? ?Social Determinants of Health  ? ?Financial Resource Strain: Low Risk   ? Difficulty of Paying Living Expenses: Not hard at all  ?Food Insecurity: No Food Insecurity  ? Worried About Charity fundraiser in the Last Year: Never true  ? Ran Out of Food in the Last Year: Never true  ?Transportation Needs: No Transportation Needs  ?  Lack of Transportation (Medical): No  ? Lack of Transportation (Non-Medical): No  ?Physical Activity: Inactive  ? Days of Exercise per Week: 0 days  ? Minutes of Exercise per Session: 0 min  ?Stress: Stress Concern Present  ? Feeling of Stress : Very much  ?Social Connections: Moderately Integrated  ? Frequency of Communication with Friends and Family: More than three times a week  ? Frequency of Social Gatherings with Friends and Family: More than three times a week  ? Attends Religious Services: More than 4 times per year  ? Active Member of Clubs or Organizations: No  ? Attends Archivist Meetings: Never  ? Marital Status: Married  ? ? ? ?Heart & Vascular Transition of Care Clinic follow-up: ?Scheduled for 01/09/22@9 .  ?Confirmed transportation. ? ?Immediate social needs: Patient wanting to fill out disability paperwork. ? ?HF CSW spoke with Ms. Tweten at bedside about a disability application and she agreed to sign and for the HF CSW to send to the [facility]. ? ?[place], MSW, LCSW ?[phone] ?Heart Failure Social Worker  ?

## 2021-12-28 NOTE — Discharge Summary (Signed)
Physician Discharge Summary  ?[name] [id number] DOB: [birth year] DOA: 12/20/2021 ? ?PCP: Shelda Pal, DO ? ?Admit date: 12/20/2021 ?Discharge date: 12/27/2021 ? ?Time spent: 35 minutes ? ?Recommendations for Outpatient Follow-up:  ?Needs close follow-up with PCP Dr. Nani Ravens, in 3 to 4 days, please check BMP in 1 week ?Patient was adamant to discharge home before clinically euvolemic ?Heart failure TOC clinic, titrate diuretics ?Cardiology Dr. Radford Pax in 1 month ? ?Discharge Diagnoses:  ?Principal Problem: ?  Acute on chronic diastolic CHF (congestive heart failure) ([place]) ?Active Problems: ?  Left leg cellulitis ?  Hypothyroidism ?  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[place]) ?  Hyperkalemia ?  Diabetes mellitus type 2 in obese [facility]) ?  NAFLD (nonalcoholic fatty liver disease) ?  Essential hypertension ?  CAD S/P percutaneous coronary angioplasty ?  Hx of AKA (above knee amputation), right ([place]) ?  Iron deficiency anemia ?  Class 3 obesity (HCC) ?  Sacral decubitus ulcer, stage II ([place]) ? ? ?Discharge Condition: Improving ? ?Diet recommendation:, Low-sodium, heart healthy ? ?Filed Weights  ? 12/26/21 0545 12/27/21 0427 12/27/21 1157  ?Weight: 130.9 kg (!) 140.6 kg 129.4 kg  ? ? ?History of present illness:  ?61 y.o. 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  Reportedly she has not been taking Lasix due to desire to avoid frequent trips to the bathroom. Seen in the ED -afebrile with leukocytosis (WBC 10.7k), hypoglycemic, appeared grossly volume overloaded and admitted.  She is being managed  for acute on chronic HFpEF with left leg cellulitis pressure ulcers hyperkalemia, lactic acidosis.  ? ?Hospital Course:  ? ?Acute on chronic diastolic CHF (congestive heart failure) (HCC) ?-2D echo EF 55 to 60% poor acoustic windows due to body habitus ?-Patient admits to poor compliance with diuretics at home had stopped taking diuretics on account of leg cramps and need for frequent bathroom visits ?-Volume status improving, she is 7 L weight inaccurate today, clinically improving but not euvolemic yet ?-Transition to Lasix 40 Mg twice daily for 3 days followed by 40 Mg daily ?-Also continued on Aldactone ?-poor candidate for Farxiga on account of UTI risk ?-High risk of quick readmission, importance of compliance with diuretics, salt/fluid restriction emphasized ?-She has a TOC clinic follow-up coming up soon, needs BMP checked within 1 week ?  ?Left leg cellulitis ?-Improving, completed 7 days of IV ceftriaxone in the hospital ?  ?Hypothyroidism ?TSH elevated at 9.3, she is supposedly on 200 mcg of levothyroxine at baseline,  repeat TSH is also elevated at 9.6, husband was later able to tell me that she had not taken Synthroid for 2 weeks prior to admission on account of being out of this medicine, which they just got refilled, did not need to increase Synthroid in this setting, recommend repeating labs in 6 weeks after consistent Synthroid use ? ?Hyperkalemia ?-Resolved ?  ?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[place]) ?No signs of acute flare ?-Continue hydroxychloroquine.  ?  ?Diabetes mellitus type 2 in obese [facility]) ?-CBGs are uncontrolled, she is on U-500 insulin, follow-up with PCP for this ?  ?NAFLD (nonalcoholic fatty liver disease) ?No signs  of decompensated liver disease. ?-Needs to lose weight ?  ?CAD S/P percutaneous coronary angioplasty ?No chest pain, continue blood pressure control.  ?  ?Hx of AKA (above knee amputation), right ([place]) ?Continue PT and OT ?  ?Iron deficiency anemia ?Iron level  with serum iron of 24, transferring saturation of 5 with ferritin of 7 and TIBC 462 ?Consistent in iron deficiency anemia ?-Given 2 doses of IV iron this admission,, and resume oral iron therapy and follow-up ?  ?Class 3 obesity (HCC) ?Calculated BMI is 53,7  ? ?Discharge Exam: ?Vitals:  ? 12/27/21 0927 12/27/21 0929  ?BP: (!) 209/135 (!) 179/90  ?Pulse: (!) 101 (!) 101  ?Resp: 18   ?Temp: 98 ?F (36.7 ?C)   ?SpO2: 94%   ? ?General exam: Obese chronically ill female sitting up in the recliner, AAOx3, no distress ?HEENT: Neck obese unable to assess JVD ?CVS: S1-S2 obese female sitting up in recliner, AAOx3, no distress ?HEENT: Neck obese unable to assess JVD ?CVS: S1-S2, regular rhythm ?Lungs: Poor air movement, clear ?Abdomen: Soft, obese, nontender, abdominal wall edema, bowel sounds present ?Extremities: 2+ edema extending all the way to the upper thigh, right BKA, ?Skin: Left leg with redness erythema involving most lower leg improved significantly ?Psychiatry: Judgement and insight appear normal. Mood & affect appropriate.  ? ?Discharge Instructions ? ? ?Discharge Instructions   ? ? Diet - low sodium heart healthy   Complete by: As directed ?  ? Diet Carb Modified   Complete by: As directed ?  ? Increase activity slowly   Complete by: As directed ?  ? No wound care   Complete by: As directed ?  ? routine  ? ?  ? ?Allergies as of 12/27/2021   ? ?   Reactions  ? Fish Allergy Anaphylaxis  ? INCLUDES OMEGA 3 OILS  ? Fish Oil Anaphylaxis, Swelling  ? THROAT SWELLS ?INCLUDES FISH AS A CLASS  ? Omega-3 Fatty Acids Swelling  ? Throat swelling  ?Has tolerated Glucerna nutritional drinks  ? Crestor [rosuvastatin] Other (See Comments)  ? Extreme joint pain, to this & other statins  ? Gabapentin Anxiety  ? Anxiety on high doses  ? Lovastatin Other (See Comments)  ? EXTREME JOINT PAIN  ? Metronidazole Nausea Only, Swelling  ? Headache and shakes ?Nausea and vomiting  ? Red Yeast Rice [cholestin] Other (See Comments)  ? Muscle  pain and severe joint pain.   ? Rotigotine Swelling  ? (Neupro) Extreme edema  ? Atrovent Hfa [ipratropium Bromide Hfa] Other (See Comments)  ? wheezing  ? Iodine Swelling  ? Other Other (See Comments)  ? Surgical Staples causes redness/infection per patient.  ? Pepto-bismol [bismuth Subsalicylate] Nausea And Vomiting  ? Extreme vomiting  ? Victoza [liraglutide]   ? Unsure of reaction   ? Enalapril Cough  ? Suprep [na Sulfate-k Sulfate-mg Sulf] Nausea And Vomiting  ? Tape Other (See Comments)  ? Electrodes causes skin breakdown  ? ?  ? ?  ?Medication List  ?  ? ?STOP taking these medications   ? ?cyclobenzaprine 10 MG tablet ?Commonly known as: FLEXERIL ?  ?tiZANidine 2 MG tablet ?Commonly known as: ZANAFLEX ?  ?valsartan 160 MG tablet ?Commonly known as: DIOVAN ?  ? ?  ? ?TAKE these medications   ? ?albuterol (2.5 MG/3ML) 0.083% nebulizer solution ?Commonly known as: PROVENTIL ?USE 1 VIAL IN NEBULIZER EVERY 4 HOURS AS NEEDED FOR WHEEZING FOR SHORTNESS OF BREATH ?What changed: See the new instructions. ?  ?  albuterol 108 (90 Base) MCG/ACT inhaler ?Commonly known as: ProAir HFA ?Inhale 2 puffs into the lungs every 4 (four) hours as needed for wheezing or shortness of breath. ?What changed: Another medication with the same name was changed. Make sure you understand how and when to take each. ?  ?aspirin EC 81 MG tablet ?Take 1 tablet (81 mg total) by mouth every evening. ?  ?augmented betamethasone dipropionate 0.05 % cream ?Commonly known as: Diprolene AF ?Apply topically 2 (two) times daily. ?  ?betamethasone dipropionate 0.05 % cream ?Apply 1 application. topically 2 (two) times daily as needed (extreme itching). ?  ?diclofenac Sodium 1 % Gel ?Commonly known as: VOLTAREN ?Apply 2 g topically 4 (four) times daily. ?  ?Dilt-XR 240 MG 24 hr capsule ?Generic drug: diltiazem ?Take 240 mg by mouth daily. ?  ?EPINEPHrine 0.3 mg/0.3 mL Soaj injection ?Commonly known as: EpiPen 2-Pak ?USE AS DIRECTED FOR SEVERE ALLERGIC  REACTION. ?What changed:  ?how much to take ?how to take this ?when to take this ?reasons to take this ?additional instructions ?  ?ezetimibe 10 MG tablet ?Commonly known as: ZETIA ?Take 1 tablet (10 mg tot

## 2022-01-01 NOTE — Progress Notes (Signed)
?Cardiology Office Note:   ? ?Date:  01/02/2022  ? ?ID:  [name], DOB October 27, 1960, MRN [id number] ? ?PCP:  Shelda Pal, DO  ?[facility] Cardiologist:  Fransico Him, MD  ?[facility] Electrophysiologist:  None  ? ?Chief Complaint: Hospital follow up  ? ?History of Present Illness:   ? ?[name] is a 61 y.o. female with a hx of CAD status post DES to RCA, hyperlipidemia with statin intolerance, hypertension, chronic diastolic heart failure, diabetes mellitus, venous stasis dermatitis, isolated episode of atrial fibrillation, rheumatoid arthritis with mixed connective tissue disorder, peripheral arterial disease s/p R AKA and morbid obesity presents for hospital follow-up ? ?Last cath 04/19/18 showed severe stenosis of the ostial RCA, 99% stenosed, treated with successful PCI + DES placement. The previously placed stents in the mid and distal RCA were patent with only minimal stenosis. The LAD had only mild, 20% disease. ? ?She also has a hx  of A. fib with RVR in the setting of sepsis and bacteremia and converted to NSR and has not had any further A. fib.  She was on Lopressor but no anticoagulation.  Her CHADS2VASC score was 5 but due to the very brief nature of her A. fib in the setting of acute infection and respiratory failure it was recommended to hold off on anticoagulation unless she had further episodes of atrial fibrillation.  Follow-up monitor 03/2021 without evidence of recurrent atrial fibrillation. ? ?Admitted 12/2021 for acute diastolic CHF exacerbation in setting of noncompliance with diuretic.  Did not felt candidate for [place] due to UTI risk.  Treated with antibiotic for lower extremity, left side, cellulitis.  She was noncompliant with her thyroid medication.  TSH was elevated.  Medication was adjusted. ? ?Here today for follow-up.  Patient report compliant with medication and low-sodium diet.  Fluid status improving.  Working on getting prosthesis.  No chest pain,  shortness of breath, orthopnea, PND or syncope. ? ? ?Past Medical History:  ?Diagnosis Date  ? Aortic stenosis   ? mild AS by echo 02/2021  ? Asthma   ? "on daily RX and rescue inhaler" (04/18/2018)  ? Chronic diastolic CHF (congestive heart failure) ([place]) 09/2015  ? Chronic lower back pain   ? Chronic neck pain   ? Chronic pain syndrome   ? Fentanyl Patch  ? Colon polyps   ? Coronary artery disease   ? cath with normal LM, 30% LAD, 85% mid RCA and 95% distal RCA s/p PCI of the mid to distal RCA and now on DAPT with ASA and Ticagrelor.    ? Eczema   ? Excessive daytime sleepiness 11/26/2015  ? Fibromyalgia   ? Gallstones   ? GERD (gastroesophageal reflux disease)   ? Heart murmur   ? "noted for the 1st time on 04/18/2018"  ? History of blood transfusion 07/2010  ? "S/P oophorectomy"  ? History of gout   ? History of hiatal hernia 1980s  ? "gone now" (04/18/2018)  ? Hyperlipidemia   ? Hypertension   ? takes Metoprolol and Enalapril daily  ? Hypothyroidism   ? takes Synthroid daily  ? IBS (irritable bowel syndrome)   ? Migraine   ? "nothing in the 2000s" (04/18/2018)  ? Mixed connective tissue disease ([place])   ? NAFLD (nonalcoholic fatty liver disease)   ? Pneumonia   ? "several times" (04/18/2018)  ? PVC's (premature ventricular contractions)   ? noted on event monitor 02/2021  ? Rheumatoid arthritis ([place])   ? "  hands, elbows, shoulders, probably knees" (04/18/2018)  ? Scoliosis   ? Sleep apnea   ? mild - does not use cpap  ? Spondylosis   ? Type II diabetes mellitus ([place])   ? takes Metformin and Hum R daily (04/18/2018)  ? Walker as ambulation aid   ? also uses wheelchair  ? ? ?Past Surgical History:  ?Procedure Laterality Date  ? ABDOMINAL HYSTERECTOMY  06/2005  ? "w/right ovariy"  ? AMPUTATION Right 01/28/2020  ?  RIGHT FOURTH AND FIFTH RAY AMPUTATION  ? AMPUTATION Right 01/28/2020  ? Procedure: RIGHT FOURTH AND FIFTH RAY AMPUTATION,;  Surgeon: Newt Minion, MD;  Location: [place];  Service: Orthopedics;  Laterality: Right;  ?  AMPUTATION Right 06/01/2021  ? Procedure: RIGHT BELOW KNEE AMPUTATION;  Surgeon: Newt Minion, MD;  Location: [place];  Service: Orthopedics;  Laterality: Right;  ? AMPUTATION Right 07/22/2021  ? Procedure: RIGHT ABOVE KNEE AMPUTATION;  Surgeon: Newt Minion, MD;  Location: [place];  Service: Orthopedics;  Laterality: Right;  ? APPENDECTOMY    ? BREAST BIOPSY Bilateral   ? 9 total (04/18/2018)  ? CORONARY ANGIOPLASTY WITH STENT PLACEMENT  10/01/2015  ? normal LM, 30% LAD, 85% mid RCA and 95% distal RCA s/p PCI of the mid to distal RCA and now on DAPT with ASA and Ticagrelor.    ? CORONARY STENT INTERVENTION Right 04/18/2018  ? Procedure: CORONARY STENT INTERVENTION;  Surgeon: Burnell Blanks, MD;  Location: [place] [facility];  Service: Cardiovascular;  Laterality: Right;  ? DILATION AND CURETTAGE OF UTERUS    ? FRACTURE SURGERY    ? LAPAROSCOPIC CHOLECYSTECTOMY    ? LEFT HEART CATH AND CORONARY ANGIOGRAPHY N/A 04/18/2018  ? Procedure: LEFT HEART CATH AND CORONARY ANGIOGRAPHY;  Surgeon: Burnell Blanks, MD;  Location: [place] [facility];  Service: Cardiovascular;  Laterality: N/A;  ? MUSCLE BIOPSY Left   ? "leg"  ? OOPHORECTOMY  07/2010  ? RADIOLOGY WITH ANESTHESIA N/A 05/25/2016  ? Procedure: RADIOLOGY WITH ANESTHESIA;  Surgeon: Medication Radiologist, MD;  Location: [place];  Service: Radiology;  Laterality: N/A;  ? STUMP REVISION Right 06/29/2021  ? Procedure: REVISION RIGHT BELOW KNEE AMPUTATION;  Surgeon: Newt Minion, MD;  Location: [place];  Service: Orthopedics;  Laterality: Right;  ? TEE WITHOUT CARDIOVERSION N/A 06/01/2021  ? Procedure: TRANSESOPHAGEAL ECHOCARDIOGRAM (TEE);  Surgeon: Geralynn Rile, MD;  Location: [place];  Service: Cardiovascular;  Laterality: N/A;  ? TONSILLECTOMY AND ADENOIDECTOMY    ? WRIST FRACTURE SURGERY Left   ? "crushed it"  ? ? ?Current Medications: ?Current Meds  ?Medication Sig  ? albuterol (PROAIR HFA) 108 (90 Base) MCG/ACT inhaler Inhale 2 puffs into the lungs every  4 (four) hours as needed for wheezing or shortness of breath.  ? albuterol (PROVENTIL) (2.5 MG/3ML) 0.083% nebulizer solution USE 1 VIAL IN NEBULIZER EVERY 4 HOURS AS NEEDED FOR WHEEZING FOR SHORTNESS OF BREATH (Patient taking differently: Take 2.5 mg by nebulization every 4 (four) hours as needed.)  ? ascorbic acid (VITAMIN C) 500 MG tablet Take 1 tablet (500 mg total) by mouth 2 (two) times daily.  ? aspirin EC 81 MG tablet Take 1 tablet (81 mg total) by mouth every evening.  ? augmented betamethasone dipropionate (DIPROLENE AF) 0.05 % cream Apply topically 2 (two) times daily.  ? betamethasone dipropionate 0.05 % cream Apply 1 application. topically 2 (two) times daily as needed (extreme itching).  ? collagenase (SANTYL) ointment Apply 1 application  topically daily.  ? diclofenac Sodium (VOLTAREN) 1 % GEL Apply 2 g topically 4 (four) times daily.  ? DILT-XR 240 MG 24 hr capsule Take 240 mg by mouth daily.  ? EPINEPHrine (EPIPEN 2-PAK) 0.3 mg/0.3 mL IJ SOAJ injection USE AS DIRECTED FOR SEVERE ALLERGIC REACTION. (Patient taking differently: Inject 0.3 mg into the muscle as needed for anaphylaxis.)  ? ezetimibe (ZETIA) 10 MG tablet Take 1 tablet (10 mg total) by mouth daily.  ? fluconazole (DIFLUCAN) 150 MG tablet Take 1 tablet (150 mg total) by mouth every 3 (three) days.  ? fluticasone (FLONASE) 50 MCG/ACT nasal spray USE 2 SPRAY(S) IN EACH NOSTRIL ONCE DAILY AS NEEDED FOR ALLERGIES OR  RHINITIS (Patient taking differently: Place 2 sprays into both nostrils daily.)  ? fluticasone-salmeterol (ADVAIR) 250-50 MCG/ACT AEPB Inhale 1 puff into the lungs in the morning and at bedtime.  ? folic acid (FOLVITE) 1 MG tablet Take 1 mg by mouth daily.  ? furosemide (LASIX) 20 MG tablet Take 2 tablets (40 mg total) by mouth 2 (two) times daily. Take 40mg  BID for 3days then 40mg  daily  ? gabapentin (NEURONTIN) 400 MG capsule Take 1 capsule by mouth in the morning, 1 capsule at noon, and 2 capsules at bed time. (Patient taking  differently: Take 400-800 mg by mouth 3 (three) times daily. Take 1 cap (400mg ) in the morning and afternoon THEN take 2 cap (800mg ) at nighttime)  ? GLUCOMANNAN PO Take 1,995 mg by mouth in the morning, at noon,

## 2022-01-02 ENCOUNTER — Encounter: Payer: Self-pay | Admitting: Physician Assistant

## 2022-01-02 ENCOUNTER — Ambulatory Visit (INDEPENDENT_AMBULATORY_CARE_PROVIDER_SITE_OTHER): Payer: 59 | Admitting: Physician Assistant

## 2022-01-02 VITALS — BP 130/60 | HR 98 | Ht 62.0 in | Wt 296.6 lb

## 2022-01-02 DIAGNOSIS — I5032 Chronic diastolic (congestive) heart failure: Secondary | ICD-10-CM | POA: Diagnosis not present

## 2022-01-02 DIAGNOSIS — I2510 Atherosclerotic heart disease of native coronary artery without angina pectoris: Secondary | ICD-10-CM

## 2022-01-02 DIAGNOSIS — Z79899 Other long term (current) drug therapy: Secondary | ICD-10-CM | POA: Diagnosis not present

## 2022-01-02 NOTE — Patient Instructions (Signed)
Medication Instructions:  ?Your physician recommends that you continue on your current medications as directed. Please refer to the Current Medication list given to you today.  ?*If you need a refill on your cardiac medications before your next appointment, please call your pharmacy* ? ? ?Lab Work: ?BMP today ?If you have labs (blood work) drawn today and your tests are completely normal, you will receive your results only by: ?MyChart Message (if you have MyChart) OR ?A paper copy in the mail ?If you have any lab test that is abnormal or we need to change your treatment, we will call you to review the results. ? ? ?Follow-Up: ?At [facility], you and your health needs are our priority.  As part of our continuing mission to provide you with exceptional heart care, we have created designated Provider Care Teams.  These Care Teams include your primary Cardiologist (physician) and Advanced Practice Providers (APPs -  Physician Assistants and Nurse Practitioners) who all work together to provide you with the care you need, when you need it. ? ?Your next appointment:   ?3 month(s) ? ?The format for your next appointment:   ?In Person ? ?Provider:   ?Armanda Magicraci Turner, MD or APP ? ?Important Information About Sugar ? ? ? ? ?  ?

## 2022-01-03 ENCOUNTER — Other Ambulatory Visit: Payer: Self-pay

## 2022-01-03 DIAGNOSIS — I5032 Chronic diastolic (congestive) heart failure: Secondary | ICD-10-CM

## 2022-01-03 DIAGNOSIS — I2510 Atherosclerotic heart disease of native coronary artery without angina pectoris: Secondary | ICD-10-CM

## 2022-01-03 LAB — BASIC METABOLIC PANEL
BUN/Creatinine Ratio: 34 — ABNORMAL HIGH (ref 12–28)
BUN: 45 mg/dL — ABNORMAL HIGH (ref 8–27)
CO2: 21 mmol/L (ref 20–29)
Calcium: 9.4 mg/dL (ref 8.7–10.3)
Chloride: 100 mmol/L (ref 96–106)
Creatinine, Ser: 1.32 mg/dL — ABNORMAL HIGH (ref 0.57–1.00)
Glucose: 250 mg/dL — ABNORMAL HIGH (ref 70–99)
Potassium: 4.9 mmol/L (ref 3.5–5.2)
Sodium: 139 mmol/L (ref 134–144)
eGFR: 46 mL/min/{1.73_m2} — ABNORMAL LOW (ref >59)

## 2022-01-03 MED ORDER — FUROSEMIDE 20 MG PO TABS: 40.0000 mg | ORAL_TABLET | Freq: Every day | ORAL | Status: DC

## 2022-01-04 ENCOUNTER — Telehealth: Payer: Self-pay | Admitting: *Deleted

## 2022-01-04 NOTE — Chronic Care Management (AMB) (Signed)
?  Care Management  ? ?Note ? ?01/04/2022 ?Name: [name] MRN: [id number] DOB: 11-Jul-1961 ? ?[name] is a 61 y.o. year old female who is a primary care patient of Sharlene Dory, DO and is actively engaged with the care management team. I reached out to [facility] by phone today to assist with re-scheduling a follow up visit with the RN Case Manager and Pharmacist ? ?Follow up plan: ?Telephone appointment with care management team member scheduled for: 01/12/2022 and 01/30/2022 ? ?Jonah Nestle, CCMA ?Care Guide, Embedded Care Coordination ?[place]  Care Management  ?Direct Dial: [phone] ? ? ?

## 2022-01-06 ENCOUNTER — Encounter: Payer: Self-pay | Admitting: Family Medicine

## 2022-01-06 ENCOUNTER — Ambulatory Visit (INDEPENDENT_AMBULATORY_CARE_PROVIDER_SITE_OTHER): Payer: 59 | Admitting: Family Medicine

## 2022-01-06 VITALS — BP 118/72 | HR 84 | Temp 97.9°F

## 2022-01-06 DIAGNOSIS — L03116 Cellulitis of left lower limb: Secondary | ICD-10-CM | POA: Diagnosis not present

## 2022-01-06 DIAGNOSIS — I5033 Acute on chronic diastolic (congestive) heart failure: Secondary | ICD-10-CM

## 2022-01-06 DIAGNOSIS — E1165 Type 2 diabetes mellitus with hyperglycemia: Secondary | ICD-10-CM

## 2022-01-06 DIAGNOSIS — G629 Polyneuropathy, unspecified: Secondary | ICD-10-CM

## 2022-01-06 DIAGNOSIS — Z794 Long term (current) use of insulin: Secondary | ICD-10-CM

## 2022-01-06 LAB — CBC
HCT: 32.9 % — ABNORMAL LOW (ref 36.0–46.0)
Hemoglobin: 9.8 g/dL — ABNORMAL LOW (ref 12.0–15.0)
MCHC: 29.9 g/dL — ABNORMAL LOW (ref 30.0–36.0)
MCV: 71 fl — ABNORMAL LOW (ref 78.0–100.0)
Platelets: 292 10*3/uL (ref 150.0–400.0)
RBC: 4.64 Mil/uL (ref 3.87–5.11)
RDW: 22 % — ABNORMAL HIGH (ref 11.5–15.5)
WBC: 9.8 10*3/uL (ref 4.0–10.5)

## 2022-01-06 LAB — BASIC METABOLIC PANEL
BUN: 43 mg/dL — ABNORMAL HIGH (ref 6–23)
CO2: 28 mEq/L (ref 19–32)
Calcium: 9.6 mg/dL (ref 8.4–10.5)
Chloride: 102 mEq/L (ref 96–112)
Creatinine, Ser: 1.46 mg/dL — ABNORMAL HIGH (ref 0.40–1.20)
GFR: 38.88 mL/min — ABNORMAL LOW (ref >60.00)
Glucose, Bld: 164 mg/dL — ABNORMAL HIGH (ref 70–99)
Potassium: 5.2 mEq/L — ABNORMAL HIGH (ref 3.5–5.1)
Sodium: 140 mEq/L (ref 135–145)

## 2022-01-06 MED ORDER — FLUCONAZOLE 150 MG PO TABS: ORAL_TABLET | ORAL | 0 refills | Status: DC

## 2022-01-06 MED ORDER — CEPHALEXIN 500 MG PO CAPS: 500.0000 mg | ORAL_CAPSULE | Freq: Three times a day (TID) | ORAL | 0 refills | Status: AC

## 2022-01-06 MED ORDER — DAPAGLIFLOZIN PROPANEDIOL 10 MG PO TABS: 10.0000 mg | ORAL_TABLET | Freq: Every day | ORAL | 3 refills | Status: DC

## 2022-01-06 NOTE — Patient Instructions (Addendum)
Give [place] 2-3 business days to get the results of your labs back.  ? ?There is an increased risk of yeast infections and UTI's on this new diabetes medication. If we start having this, let me know as we will have to make an adjustment.  ? ?If you do not hear anything about your referral in the next 1-2 weeks, call our office and ask for an update. ? ?Let us know if you need anything. ?

## 2022-01-06 NOTE — Progress Notes (Signed)
Chief Complaint  ?Patient presents with  ? Hospitalization Follow-up  ? ? ?HPI ?[name] is a 61 y.o. y.o. female who presents for a transition of care visit.  Pt was discharged from [facility] on 12/27/21.  Within 48 business hours of discharge our office contacted pt via telephone to coordinate care and needs. Here w spouse.  ? ?4/11-4/18 admitted for LLE cellulitis in addition to CHF exacerbation. Was on 7 d of Rocephin and required no further abx. Diuresed well. Currently on Spironolactone and Lasix. Swelling improving, breathing back to normal. Not on O2. Still having redness (chronic) and pain in LE which plateaued after hosp dc.  Historically, she is required longer durations of antibiotics to treat her lower extremity cellulitis.  She tested negative on the MRSA swab. ? ?Sugars were very high in the hospital.  She normally takes 10 to 30 units of Humalin.  Sugars have been running in the mid 100s to mid 200s.  She is compliant with metformin 500 mg twice daily. No exercise. Diet is fair. No hypoglycemia.  ? ?Past Medical History:  ?Diagnosis Date  ? Aortic stenosis   ? mild AS by echo 02/2021  ? Asthma   ? "on daily RX and rescue inhaler" (04/18/2018)  ? Chronic diastolic CHF (congestive heart failure) ([place]) 09/2015  ? Chronic lower back pain   ? Chronic neck pain   ? Chronic pain syndrome   ? Fentanyl Patch  ? Colon polyps   ? Coronary artery disease   ? cath with normal LM, 30% LAD, 85% mid RCA and 95% distal RCA s/p PCI of the mid to distal RCA and now on DAPT with ASA and Ticagrelor.    ? Eczema   ? Excessive daytime sleepiness 11/26/2015  ? Fibromyalgia   ? Gallstones   ? GERD (gastroesophageal reflux disease)   ? Heart murmur   ? "noted for the 1st time on 04/18/2018"  ? History of blood transfusion 07/2010  ? "S/P oophorectomy"  ? History of gout   ? History of hiatal hernia 1980s  ? "gone now" (04/18/2018)  ? Hyperlipidemia   ? Hypertension   ? takes Metoprolol and Enalapril daily  ? Hypothyroidism   ? takes  Synthroid daily  ? IBS (irritable bowel syndrome)   ? Migraine   ? "nothing in the 2000s" (04/18/2018)  ? Mixed connective tissue disease ([place])   ? NAFLD (nonalcoholic fatty liver disease)   ? Pneumonia   ? "several times" (04/18/2018)  ? PVC's (premature ventricular contractions)   ? noted on event monitor 02/2021  ? Rheumatoid arthritis ([place])   ? "hands, elbows, shoulders, probably knees" (04/18/2018)  ? Scoliosis   ? Sleep apnea   ? mild - does not use cpap  ? Spondylosis   ? Type II diabetes mellitus ([place])   ? takes Metformin and Hum R daily (04/18/2018)  ? Walker as ambulation aid   ? also uses wheelchair  ? ?Past Surgical History:  ?Procedure Laterality Date  ? ABDOMINAL HYSTERECTOMY  06/2005  ? "w/right ovariy"  ? AMPUTATION Right 01/28/2020  ?  RIGHT FOURTH AND FIFTH RAY AMPUTATION  ? AMPUTATION Right 01/28/2020  ? Procedure: RIGHT FOURTH AND FIFTH RAY AMPUTATION,;  Surgeon: Newt Minion, MD;  Location: [place];  Service: Orthopedics;  Laterality: Right;  ? AMPUTATION Right 06/01/2021  ? Procedure: RIGHT BELOW KNEE AMPUTATION;  Surgeon: Newt Minion, MD;  Location: [place];  Service: Orthopedics;  Laterality: Right;  ? AMPUTATION  Right 07/22/2021  ? Procedure: RIGHT ABOVE KNEE AMPUTATION;  Surgeon: Newt Minion, MD;  Location: [place];  Service: Orthopedics;  Laterality: Right;  ? APPENDECTOMY    ? BREAST BIOPSY Bilateral   ? 9 total (04/18/2018)  ? CORONARY ANGIOPLASTY WITH STENT PLACEMENT  10/01/2015  ? normal LM, 30% LAD, 85% mid RCA and 95% distal RCA s/p PCI of the mid to distal RCA and now on DAPT with ASA and Ticagrelor.    ? CORONARY STENT INTERVENTION Right 04/18/2018  ? Procedure: CORONARY STENT INTERVENTION;  Surgeon: Burnell Blanks, MD;  Location: [place] [facility];  Service: Cardiovascular;  Laterality: Right;  ? DILATION AND CURETTAGE OF UTERUS    ? FRACTURE SURGERY    ? LAPAROSCOPIC CHOLECYSTECTOMY    ? LEFT HEART CATH AND CORONARY ANGIOGRAPHY N/A 04/18/2018  ? Procedure: LEFT HEART CATH AND CORONARY  ANGIOGRAPHY;  Surgeon: Burnell Blanks, MD;  Location: [place] [facility];  Service: Cardiovascular;  Laterality: N/A;  ? MUSCLE BIOPSY Left   ? "leg"  ? OOPHORECTOMY  07/2010  ? RADIOLOGY WITH ANESTHESIA N/A 05/25/2016  ? Procedure: RADIOLOGY WITH ANESTHESIA;  Surgeon: Medication Radiologist, MD;  Location: [place];  Service: Radiology;  Laterality: N/A;  ? STUMP REVISION Right 06/29/2021  ? Procedure: REVISION RIGHT BELOW KNEE AMPUTATION;  Surgeon: Newt Minion, MD;  Location: [place];  Service: Orthopedics;  Laterality: Right;  ? TEE WITHOUT CARDIOVERSION N/A 06/01/2021  ? Procedure: TRANSESOPHAGEAL ECHOCARDIOGRAM (TEE);  Surgeon: Geralynn Rile, MD;  Location: [place];  Service: Cardiovascular;  Laterality: N/A;  ? TONSILLECTOMY AND ADENOIDECTOMY    ? WRIST FRACTURE SURGERY Left   ? "crushed it"  ? ?Family History  ?Problem Relation Age of Onset  ? CAD Mother   ? Hypertension Mother   ? Heart attack Mother   ? CAD Father   ? Heart attack Father   ? Allergic rhinitis Father   ? Asthma Father   ? Hypertension Brother   ? Hypertension Brother   ? Pancreatic cancer Paternal Aunt   ? Breast cancer Paternal Aunt   ? Lung cancer Paternal Aunt   ? Asthma Son   ? ?Allergies as of 01/06/2022   ? ?   Reactions  ? Fish Allergy Anaphylaxis  ? INCLUDES OMEGA 3 OILS  ? Fish Oil Anaphylaxis, Swelling  ? THROAT SWELLS ?INCLUDES FISH AS A CLASS  ? Omega-3 Fatty Acids Swelling  ? Throat swelling  ?Has tolerated Glucerna nutritional drinks  ? Crestor [rosuvastatin] Other (See Comments)  ? Extreme joint pain, to this & other statins  ? Gabapentin Anxiety  ? Anxiety on high doses  ? Lovastatin Other (See Comments)  ? EXTREME JOINT PAIN  ? Metronidazole Nausea Only, Swelling  ? Headache and shakes ?Nausea and vomiting  ? Red Yeast Rice [cholestin] Other (See Comments)  ? Muscle pain and severe joint pain.   ? Rotigotine Swelling  ? (Neupro) Extreme edema  ? Atrovent Hfa [ipratropium Bromide Hfa] Other (See Comments)  ?  wheezing  ? Iodine Swelling  ? Other Other (See Comments)  ? Surgical Staples causes redness/infection per patient.  ? Pepto-bismol [bismuth Subsalicylate] Nausea And Vomiting  ? Extreme vomiting  ? Victoza [liraglutide]   ? Unsure of reaction   ? Enalapril Cough  ? Suprep [na Sulfate-k Sulfate-mg Sulf] Nausea And Vomiting  ? Tape Other (See Comments)  ? Electrodes causes skin breakdown  ? ?  ? ?  ?Medication List  ?  ? ?  ?  Accurate as of January 06, 2022 12:17 PM. If you have any questions, ask your nurse or doctor.  ?  ?  ? ?  ? ?STOP taking these medications   ? ?vitamin B-12 1000 MCG tablet ?Commonly known as: CYANOCOBALAMIN ?Stopped by: Shelda Pal, DO ?  ? ?  ? ?TAKE these medications   ? ?albuterol (2.5 MG/3ML) 0.083% nebulizer solution ?Commonly known as: PROVENTIL ?USE 1 VIAL IN NEBULIZER EVERY 4 HOURS AS NEEDED FOR WHEEZING FOR SHORTNESS OF BREATH ?What changed: See the new instructions. ?  ?albuterol 108 (90 Base) MCG/ACT inhaler ?Commonly known as: ProAir HFA ?Inhale 2 puffs into the lungs every 4 (four) hours as needed for wheezing or shortness of breath. ?What changed: Another medication with the same name was changed. Make sure you understand how and when to take each. ?  ?aspirin EC 81 MG tablet ?Take 1 tablet (81 mg total) by mouth every evening. ?  ?augmented betamethasone dipropionate 0.05 % cream ?Commonly known as: Diprolene AF ?Apply topically 2 (two) times daily. ?  ?betamethasone dipropionate 0.05 % cream ?Apply 1 application. topically 2 (two) times daily as needed (extreme itching). ?  ?cephALEXin 500 MG capsule ?Commonly known as: KEFLEX ?Take 1 capsule (500 mg total) by mouth 3 (three) times daily for 5 days. ?Started by: Shelda Pal, DO ?  ?dapagliflozin propanediol 10 MG Tabs tablet ?Commonly known as: FARXIGA ?Take 1 tablet (10 mg total) by mouth daily before breakfast. ?Started by: Shelda Pal, DO ?  ?diclofenac Sodium 1 % Gel ?Commonly known as:  VOLTAREN ?Apply 2 g topically 4 (four) times daily. ?  ?Dilt-XR 240 MG 24 hr capsule ?Generic drug: diltiazem ?Take 240 mg by mouth daily. ?  ?EPINEPHrine 0.3 mg/0.3 mL Soaj injection ?Commonly known as: EpiPen 2

## 2022-01-08 NOTE — Progress Notes (Addendum)
? ? ?HEART & VASCULAR TRANSITION OF CARE CONSULT NOTE  ? ? ? ?Referring Physician:Dr Broadus John ?Primary Care: Shelda Pal, DO ?Primary Cardiologist: Dr Radford Pax ? ?HPI: ?Referred to clinic by Dr Broadus John for heart failure consultation.  ? ?Ms Gausman is a 61 year old with h/o obesity, PAD s/p rt BKA awaiting prosthetic leg and currently WC bound, CAD, HLD, PAF, HFpEF. Most recent cath 2019 with DES placed ostial RCA, patent stents mid/distal RVCA, and mild non obs LAD/Circumflex.  ? ?Echo 6/22 EF 60-65% RV normal  ? ?Echo 9/22 EF 55-60%, RV not well visualized.  ? ?Admitted 12/2021 with a/c CHF w/ volume overload in the setting of medication noncompliance and Lt LE cellulitis. Diuresed with IV lasix and treated w/ IV abx. Transitioned to lasix 40 mg bid. Valsartan was discontinued due to hyperkalemia but she was continued on spironolactone 25 mg daily. D/c Wt 283 lb.  ? ?Seen in [facility] clinic on 4/24. Labs showed bump in SCr, from 0.80>>1.32. BNP was normal. Lasix reduced to once daily.  ? ?Had f/u w/ PCP on 4/28. F/u BMP showed further increase in SCr to 1.46. K was elevated at 5.2 (no hemolysis). PCP added Farxiga 10 mg daily.  ? ?Presents to [facility] clinic for f/u. Here w/ husband. In [place]. Still w/o prosthesis. Being followed by Dr. Sharol Given. Unable to stand and balance for wt on clinic scale. Denies resting dyspnea. No orthopnea/PND. BP well controlled. Says she just started [place] on Saturday. So far, tolerating ok. No GU symptoms.  ? ?She does have prior h/o UTIs. We discussed increased risk w/ Wilder Glade but she wishes to continue for now. Hgb A1c 8.5.  ? ?Cardiac Testing  ?Echo EF 55-60% RV not well visualized. ? ?[place] 2019  ?1. Severe stenosis ostial RCA. ?2. Patent stents mid and distal RCA with minimal restenosis.  ?3. Mild non-obstructive disease in the LAD and Circumflex ?4. Successful PTCA/DES x 1 ostial RCA ? ?Review of Systems: [y] = yes, [ ]  = no  ? ?General: Weight gain [ ] ; Weight loss [ ] ; Anorexia [  ]; Fatigue [ ] ; Fever [ ] ; Chills [ ] ; Weakness [ ]   ?Cardiac: Chest pain/pressure [ ] ; Resting SOB [ ] ; Exertional SOB [ ] ; Orthopnea [ ] ; Pedal Edema [Y ]; Palpitations [ ] ; Syncope [ ] ; Presyncope [ ] ; Paroxysmal nocturnal dyspnea[ ]   ?Pulmonary: Cough [ ] ; Wheezing[ ] ; Hemoptysis[ ] ; Sputum [ ] ; Snoring [ ]   ?GI: Vomiting[ ] ; Dysphagia[ ] ; Melena[ ] ; Hematochezia [ ] ; Heartburn[ ] ; Abdominal pain [ ] ; Constipation [ ] ; Diarrhea [ ] ; BRBPR [ ]   ?GU: Hematuria[ ] ; Dysuria [ ] ; Nocturia[ ]   ?Vascular: Pain in legs with walking [ ] ; Pain in feet with lying flat [ ] ; Non-healing sores [ ] ; Stroke [ ] ; TIA [ ] ; Slurred speech [ ] ;  ?Neuro: Headaches[ ] ; Vertigo[ ] ; Seizures[ ] ; Paresthesias[ ] ;Blurred vision [ ] ; Diplopia [ ] ; Vision changes [ ]   ?Ortho/Skin: Arthritis [ ] ; Joint pain [ Y]; Muscle pain [ ] ; Joint swelling [ ] ; Back Pain [ Y]; Rash [ ]   ?Psych: Depression[ ] ; Anxiety[ ]   ?Heme: Bleeding problems [ ] ; Clotting disorders [ ] ; Anemia [ ]   ?Endocrine: Diabetes [Y ]; Thyroid dysfunction[ ]  ? ? ?Past Medical History:  ?Diagnosis Date  ? Aortic stenosis   ? mild AS by echo 02/2021  ? Asthma   ? "on daily RX and rescue inhaler" (04/18/2018)  ? Chronic diastolic CHF (congestive heart failure) (  [place]) 09/2015  ? Chronic lower back pain   ? Chronic neck pain   ? Chronic pain syndrome   ? Fentanyl Patch  ? Colon polyps   ? Coronary artery disease   ? cath with normal LM, 30% LAD, 85% mid RCA and 95% distal RCA s/p PCI of the mid to distal RCA and now on DAPT with ASA and Ticagrelor.    ? Eczema   ? Excessive daytime sleepiness 11/26/2015  ? Fibromyalgia   ? Gallstones   ? GERD (gastroesophageal reflux disease)   ? Heart murmur   ? "noted for the 1st time on 04/18/2018"  ? History of blood transfusion 07/2010  ? "S/P oophorectomy"  ? History of gout   ? History of hiatal hernia 1980s  ? "gone now" (04/18/2018)  ? Hyperlipidemia   ? Hypertension   ? takes Metoprolol and Enalapril daily  ? Hypothyroidism   ? takes Synthroid  daily  ? IBS (irritable bowel syndrome)   ? Migraine   ? "nothing in the 2000s" (04/18/2018)  ? Mixed connective tissue disease ([name])   ? NAFLD (nonalcoholic fatty liver disease)   ? Pneumonia   ? "several times" (04/18/2018)  ? PVC's (premature ventricular contractions)   ? noted on event monitor 02/2021  ? Rheumatoid arthritis ([place])   ? "hands, elbows, shoulders, probably knees" (04/18/2018)  ? Scoliosis   ? Sleep apnea   ? mild - does not use cpap  ? Spondylosis   ? Type II diabetes mellitus ([place])   ? takes Metformin and Hum R daily (04/18/2018)  ? Walker as ambulation aid   ? also uses wheelchair  ? ? ?Current Outpatient Medications  ?Medication Sig Dispense Refill  ? albuterol (PROAIR HFA) 108 (90 Base) MCG/ACT inhaler Inhale 2 puffs into the lungs every 4 (four) hours as needed for wheezing or shortness of breath. 18 g 5  ? albuterol (PROVENTIL) (2.5 MG/3ML) 0.083% nebulizer solution USE 1 VIAL IN NEBULIZER EVERY 4 HOURS AS NEEDED FOR WHEEZING FOR SHORTNESS OF BREATH (Patient taking differently: Take 2.5 mg by nebulization every 4 (four) hours as needed.) 450 mL 0  ? ascorbic acid (VITAMIN C) 500 MG tablet Take 1 tablet (500 mg total) by mouth 2 (two) times daily. 60 tablet 0  ? aspirin EC 81 MG tablet Take 1 tablet (81 mg total) by mouth every evening. 100 tablet 1  ? augmented betamethasone dipropionate (DIPROLENE AF) 0.05 % cream Apply topically 2 (two) times daily. 30 g 2  ? betamethasone dipropionate 0.05 % cream Apply 1 application. topically 2 (two) times daily as needed (extreme itching).    ? cephALEXin (KEFLEX) 500 MG capsule Take 1 capsule (500 mg total) by mouth 3 (three) times daily for 5 days. 15 capsule 0  ? dapagliflozin propanediol (FARXIGA) 10 MG TABS tablet Take 1 tablet (10 mg total) by mouth daily before breakfast. 30 tablet 3  ? diclofenac Sodium (VOLTAREN) 1 % GEL Apply 2 g topically 4 (four) times daily. 400 g 0  ? DILT-XR 240 MG 24 hr capsule Take 240 mg by mouth daily.    ? EPINEPHrine (EPIPEN  2-PAK) 0.3 mg/0.3 mL IJ SOAJ injection USE AS DIRECTED FOR SEVERE ALLERGIC REACTION. (Patient taking differently: Inject 0.3 mg into the muscle as needed for anaphylaxis.) 2 each 0  ? ezetimibe (ZETIA) 10 MG tablet Take 1 tablet (10 mg total) by mouth daily. 90 tablet 1  ? fluconazole (DIFLUCAN) 150 MG tablet Take 1 tab, repeat in  72 hours if no improvement. 2 tablet 0  ? fluticasone (FLONASE) 50 MCG/ACT nasal spray USE 2 SPRAY(S) IN EACH NOSTRIL ONCE DAILY AS NEEDED FOR ALLERGIES OR  RHINITIS (Patient taking differently: Place 2 sprays into both nostrils daily.) 16 g 0  ? furosemide (LASIX) 20 MG tablet Take 2 tablets (40 mg total) by mouth daily. 30 tablet   ? gabapentin (NEURONTIN) 400 MG capsule Take 1 capsule by mouth in the morning, 1 capsule at noon, and 2 capsules at bed time. (Patient taking differently: Take 400-800 mg by mouth 3 (three) times daily. Take 1 cap (400mg ) in the morning and afternoon THEN take 2 cap (800mg ) at nighttime) 360 capsule 1  ? GLUCOMANNAN PO Take 1,995 mg by mouth in the morning, at noon, and at bedtime.    ? glucose blood test strip 1 each by Other route 4 (four) times daily. Contour Next strips 100 each 12  ? HUMULIN R 500 UNIT/ML injection INJECT 0.08-0.3 MLS (40-150 UNITS TOTAL) INTO THE SKIN THREE TIMES DAILY WITH MEALS (Patient taking differently: Inject 40-150 Units into the skin 3 (three) times daily with meals.) 20 mL 0  ? hydrocortisone cream 1 % Apply 1 application topically 3 (three) times daily as needed for itching (minor skin irritation). 28 g 0  ? hydroxychloroquine (PLAQUENIL) 200 MG tablet Take 1 tablet (200 mg total) by mouth 2 (two) times daily. 180 tablet 0  ? hydrOXYzine (ATARAX) 10 MG tablet Take 1 tablet (10 mg total) by mouth 3 (three) times daily as needed. (Patient taking differently: Take 10 mg by mouth 3 (three) times daily as needed for itching.) 30 tablet 0  ? INSULIN SYRINGE 1CC/29G (B-D INSULIN SYRINGE) 29G X 1/2" 1 ML MISC Use three times daily  with insulin.  DX E11.9 200 each 3  ? isosorbide mononitrate (IMDUR) 30 MG 24 hr tablet Take 1 tablet (30 mg total) by mouth daily. (Patient taking differently: Take 30 mg by mouth at bedtime.) 90 table

## 2022-01-09 ENCOUNTER — Ambulatory Visit (HOSPITAL_COMMUNITY)
Admit: 2022-01-09 | Discharge: 2022-01-09 | Disposition: A | Discharge status: Home / Self Care | Payer: 59 | Attending: Adult Health | Admitting: Adult Health

## 2022-01-09 ENCOUNTER — Encounter (HOSPITAL_COMMUNITY): Payer: Self-pay

## 2022-01-09 ENCOUNTER — Telehealth (HOSPITAL_COMMUNITY): Payer: Self-pay | Admitting: *Deleted

## 2022-01-09 VITALS — BP 124/62 | HR 80

## 2022-01-09 DIAGNOSIS — E039 Hypothyroidism, unspecified: Secondary | ICD-10-CM | POA: Insufficient documentation

## 2022-01-09 DIAGNOSIS — E1151 Type 2 diabetes mellitus with diabetic peripheral angiopathy without gangrene: Secondary | ICD-10-CM | POA: Insufficient documentation

## 2022-01-09 DIAGNOSIS — J45909 Unspecified asthma, uncomplicated: Secondary | ICD-10-CM | POA: Diagnosis not present

## 2022-01-09 DIAGNOSIS — Z79899 Other long term (current) drug therapy: Secondary | ICD-10-CM | POA: Insufficient documentation

## 2022-01-09 DIAGNOSIS — Z955 Presence of coronary angioplasty implant and graft: Secondary | ICD-10-CM | POA: Diagnosis not present

## 2022-01-09 DIAGNOSIS — I110 Hypertensive heart disease with heart failure: Secondary | ICD-10-CM | POA: Insufficient documentation

## 2022-01-09 DIAGNOSIS — K219 Gastro-esophageal reflux disease without esophagitis: Secondary | ICD-10-CM | POA: Insufficient documentation

## 2022-01-09 DIAGNOSIS — Z87891 Personal history of nicotine dependence: Secondary | ICD-10-CM | POA: Diagnosis not present

## 2022-01-09 DIAGNOSIS — Z993 Dependence on wheelchair: Secondary | ICD-10-CM | POA: Insufficient documentation

## 2022-01-09 DIAGNOSIS — I5032 Chronic diastolic (congestive) heart failure: Secondary | ICD-10-CM | POA: Diagnosis present

## 2022-01-09 DIAGNOSIS — Z7982 Long term (current) use of aspirin: Secondary | ICD-10-CM | POA: Diagnosis not present

## 2022-01-09 DIAGNOSIS — E875 Hyperkalemia: Secondary | ICD-10-CM | POA: Diagnosis not present

## 2022-01-09 DIAGNOSIS — I2510 Atherosclerotic heart disease of native coronary artery without angina pectoris: Secondary | ICD-10-CM | POA: Insufficient documentation

## 2022-01-09 DIAGNOSIS — I4819 Other persistent atrial fibrillation: Secondary | ICD-10-CM | POA: Diagnosis not present

## 2022-01-09 DIAGNOSIS — Z794 Long term (current) use of insulin: Secondary | ICD-10-CM | POA: Insufficient documentation

## 2022-01-09 DIAGNOSIS — Z89511 Acquired absence of right leg below knee: Secondary | ICD-10-CM | POA: Diagnosis not present

## 2022-01-09 DIAGNOSIS — Z7984 Long term (current) use of oral hypoglycemic drugs: Secondary | ICD-10-CM | POA: Diagnosis not present

## 2022-01-09 DIAGNOSIS — E669 Obesity, unspecified: Secondary | ICD-10-CM | POA: Diagnosis not present

## 2022-01-09 DIAGNOSIS — E785 Hyperlipidemia, unspecified: Secondary | ICD-10-CM | POA: Insufficient documentation

## 2022-01-09 LAB — BASIC METABOLIC PANEL
Anion gap: 9 (ref 5–15)
BUN: 33 mg/dL — ABNORMAL HIGH (ref 6–20)
CO2: 25 mmol/L (ref 22–32)
Calcium: 9.3 mg/dL (ref 8.9–10.3)
Chloride: 104 mmol/L (ref 98–111)
Creatinine, Ser: 1.52 mg/dL — ABNORMAL HIGH (ref 0.44–1.00)
GFR, Estimated: 39 mL/min — ABNORMAL LOW (ref >60)
Glucose, Bld: 112 mg/dL — ABNORMAL HIGH (ref 70–99)
Potassium: 5.3 mmol/L — ABNORMAL HIGH (ref 3.5–5.1)
Sodium: 138 mmol/L (ref 135–145)

## 2022-01-09 LAB — BRAIN NATRIURETIC PEPTIDE: B Natriuretic Peptide: 19.3 pg/mL (ref 0.0–100.0)

## 2022-01-09 NOTE — Patient Instructions (Signed)
Continue current medications ? ?Labs done today, your results will be available in MyChart, we will contact you for abnormal readings. ? ?Thank you for allowing [place] to provider your heart failure care after your recent hospitalization. Please follow-up with [facility] as scheduled ? ?Do the following things EVERYDAY: ?Weigh yourself in the morning before breakfast. Write it down and keep it in a log. ?Take your medicines as prescribed ?Eat low salt foods--Limit salt (sodium) to 2000 mg per day.  ?Stay as active as you can everyday ?Limit all fluids for the day to less than 2 liters ? ?If you have any questions, issues, or concerns before your next appointment please call our office at [phone], opt. 2 and leave a message for the triage nurse. ?  ? ?

## 2022-01-09 NOTE — Telephone Encounter (Signed)
Heart Failure Nurse Navigator Progress Note  ? ?Confirmed appointment for 01/09/2022 @ 9am with patient.  ? ? ?Rhae Hammock, BSN, RN ?Heart Failure Nurse Navigator ?Secure Chat Only  ?

## 2022-01-10 ENCOUNTER — Ambulatory Visit (INDEPENDENT_AMBULATORY_CARE_PROVIDER_SITE_OTHER): Payer: 59 | Admitting: Family

## 2022-01-10 ENCOUNTER — Telehealth: Payer: Self-pay | Admitting: Family Medicine

## 2022-01-10 ENCOUNTER — Encounter: Payer: Self-pay | Admitting: Family

## 2022-01-10 DIAGNOSIS — S78111A Complete traumatic amputation at level between right hip and knee, initial encounter: Secondary | ICD-10-CM

## 2022-01-10 DIAGNOSIS — Z89611 Acquired absence of right leg above knee: Secondary | ICD-10-CM

## 2022-01-10 NOTE — Progress Notes (Signed)
? ?Office Visit Note ?  ?Patient: [name]           ?Date of Birth: [birth year]           ?MRN: [id number] ?Visit Date: 01/10/2022 ?             ?Requested by: Shelda Pal, DO ?[facility] ?[address] ?[place],  [place] [id number] ?PCP: Shelda Pal, DO ? ?Chief Complaint  ?Patient presents with  ? Right Leg - Follow-up  ?  07/22/2022 right AKA   ? ? ? ? ?HPI: ?The patient is a 61 year old woman who presents today in follow-up.  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  She states she has spoken with someone from [place] but this was many many months ago she is curious if she is ready to begin prosthesis set up.  She does not have any concerns today. ?a new ?Patient is a new right transfemoral amputee. ? ?Patient's current comorbidities are not expected to impact the ability to function with the prescribed prosthesis. ?Patient verbally communicates a strong desire to use a prosthesis. ?Patient currently requires mobility aids to ambulate without a prosthesis.  Expects not to use mobility aids with a new prosthesis. ? ?Patient is a K2 level ambulator that will use a prosthesis to walk around their home and the community over low level environmental barriers.  ? ? ? ? ?Assessment & Plan: ?Visit Diagnoses: No diagnosis found. ? ?Plan: The patient is eager to begin set up with p.o. dentist.  Provided an order to [facility] clinic she will call and ask for an appointment ? ?Follow-Up Instructions: No follow-ups on file.  ? ?Ortho Exam ? ?Patient is alert, oriented, no adenopathy, well-dressed, normal affect, normal respiratory effort. ?On examination of the right residual limb this is well-healed well consolidated there is no significant edema or erythema. ? ?Imaging: ?No results found. ?No images are attached to the encounter. ? ?Labs: ?Lab Results  ?Component Value Date  ? HGBA1C 8.5 (H) 12/22/2021  ? HGBA1C 7.3 (H) 05/27/2021  ? HGBA1C 7.1 (H) 10/27/2020  ?  ESRSEDRATE 37 (H) 01/02/2018  ? CRP 9.8 (H) 01/02/2018  ? REPTSTATUS 12/26/2021 FINAL 12/21/2021  ? GRAMSTAIN  01/28/2020  ?  ABUNDANT WBC PRESENT,BOTH PMN AND MONONUCLEAR ?ABUNDANT GRAM POSITIVE COCCI ?ABUNDANT GRAM NEGATIVE RODS ?FEW GRAM POSITIVE RODS ?  ? CULT  12/21/2021  ?  NO GROWTH 5 DAYS ?Performed at [facility], [place] [address], [place], [place] [address] ?  ? [place] (A) 06/21/2021  ? LABORGA PROTEUS MIRABILIS (A) 06/21/2021  ? ? ? ?Lab Results  ?Component Value Date  ? ALBUMIN 3.0 (L) 12/23/2021  ? ALBUMIN 3.1 (L) 12/20/2021  ? ALBUMIN 2.5 (L) 06/09/2021  ? ? ?Lab Results  ?Component Value Date  ? MG 1.8 12/24/2021  ? MG 1.7 05/02/2021  ? MG 2.2 10/20/2018  ? ?Lab Results  ?Component Value Date  ? VD25OH 69.04 07/25/2017  ? ? ?No results found for: PREALBUMIN ? ?  Latest Ref Rng & Units 01/06/2022  ? 12:15 PM 12/27/2021  ?  3:21 AM 12/26/2021  ?  4:55 AM  ?CBC EXTENDED  ?WBC 4.0 - 10.5 K/uL 9.8   10.4   9.1    ?RBC 3.87 - 5.11 Mil/uL 4.64   4.56   4.28    ?Hemoglobin 12.0 - 15.0 g/dL 9.8   9.2   8.8    ?HCT 36.0 -  46.0 % 32.9   32.9   30.0    ?Platelets 150.0 - 400.0 K/uL 292.0   287   294    ? ? ? ?There is no height or weight on file to calculate BMI. ? ?Orders:  ?No orders of the defined types were placed in this encounter. ? ?No orders of the defined types were placed in this encounter. ? ? ? Procedures: ?No procedures performed ? ?Clinical Data: ?No additional findings. ? ?ROS: ? ?All other systems negative, except as noted in the HPI. ?Review of Systems ? ?Objective: ?Vital Signs: There were no vitals taken for this visit. ? ?Specialty Comments:  ?No specialty comments available. ? ?PMFS History: ?Patient Active Problem List  ? Diagnosis Date Noted  ? Sacral decubitus ulcer, stage II ([place]) 12/24/2021  ? Class 3 obesity ([place]) 12/23/2021  ? Iron deficiency anemia 12/23/2021  ? Acute on chronic diastolic CHF (congestive heart failure) ([place]) 12/22/2021  ? Hyperkalemia 12/22/2021  ?  Left leg cellulitis 12/20/2021  ? Peripheral vascular disease, unspecified ([place]) 10/10/2021  ? Unilateral AKA, right ([place]) 10/10/2021  ? Hx of AKA (above knee amputation), right ([place]) 07/22/2021  ? Hx of BKA, right ([place]) 06/29/2021  ? Dehiscence of amputation stump (HCC)   ? Acute blood loss anemia 06/24/2021  ? Postoperative wound infection 06/24/2021  ? UTI (urinary tract infection) 06/24/2021  ? Superficial dehiscence of operation wound 06/24/2021  ? 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 ([place])   ? Bacteremia due to Enterococcus 05/28/2021  ? Sepsis ([place]) 05/26/2021  ? Digestive disorder 05/18/2021  ? Bilateral lower extremity edema 05/02/2021  ? Statin myopathy 03/04/2021  ? Aortic stenosis 03/04/2021  ? Sjogren's syndrome ([place]) 02/11/2020  ? Cutaneous abscess of right foot   ? Statin intolerance 08/16/2019  ? Gram-negative bacteremia 10/18/2018  ? Streptococcal bacteremia 10/18/2018  ? CAD S/P percutaneous coronary angioplasty 04/19/2018  ? Essential hypertension   ? Moderate persistent asthma 03/21/2018  ? NAFLD (nonalcoholic fatty liver disease)   ? Recurrent cellulitis of lower extremity 01/02/2018  ? Cellulitis of lower leg 01/02/2018  ? Chronic diarrhea 05/08/2017  ? H/O Clostridium difficile infection 05/08/2017  ? Rectal bleeding 05/08/2017  ? Hyperbilirubinemia 04/29/2016  ? Hyponatremia 04/29/2016  ? Cellulitis of right foot 03/09/2016  ? Allergic rhinitis due to pollen 02/15/2016  ? Anaphylactic reaction due to food 02/10/2016  ? Type 2 diabetes mellitus with hyperglycemia, with long-term current use of insulin ([place]) 02/10/2016  ? Gastroesophageal reflux disease without esophagitis 02/10/2016  ? Atopic eczema 02/10/2016  ? Cervical nerve root disorder 12/15/2015  ? Lumbar radiculopathy 12/15/2015  ? Daytime somnolence 11/26/2015  ? Venous stasis dermatitis of both lower extremities 02/11/2015  ? Morbid obesity ([place]) 06/19/2014  ? Abnormal LFTs 03/26/2014  ? B-complex  deficiency 03/26/2014  ? Benign essential HTN 03/26/2014  ? Chronic pain associated with significant psychosocial dysfunction 03/26/2014  ? Diaphragmatic hernia 03/26/2014  ? Gastroesophageal reflux disease 03/26/2014  ? Hammer toe 03/26/2014  ? H/O neoplasm 03/26/2014  ? Adaptive colitis 03/26/2014  ? Deafness, sensorineural 03/26/2014  ? Fibromyalgia 01/20/2014  ? 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/20/2014  ? Hyperlipidemia 11/11/2012  ? Mixed connective tissue disease ([place]) 11/11/2012  ? Hypothyroidism 11/11/2012  ? Uncomplicated asthma [id number]  ? 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[place]) 02/20/2012  ? 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 ANA positive 07/17/2011  ? ?Past Medical History:  ?Diagnosis Date  ?  Aortic stenosis   ? mild AS by echo 02/2021  ? Asthma   ? "on daily RX and rescue inhaler" (04/18/2018)  ? Chronic diastolic CHF (congestive heart failure) ([place]) 09/2015  ? Chronic lower back pain   ? Chronic neck pain   ? Chronic pain syndrome   ? Fentanyl Patch  ? Colon polyps   ? Coronary artery disease   ? cath with normal LM, 30% LAD, 85% mid RCA and 95% distal RCA s/p PCI of the mid to distal RCA and now on DAPT with ASA and Ticagrelor.    ? Eczema   ? Excessive daytime sleepiness 11/26/2015  ? Fibromyalgia   ? Gallstones   ? GERD (gastroesophageal reflux disease)   ? Heart murmur   ? "noted for the 1st time on 04/18/2018"  ? History of blood transfusion 07/2010  ? "S/P oophorectomy"  ? History of gout   ? History of hiatal hernia 1980s  ? "gone now" (04/18/2018)  ? Hyperlipidemia   ? Hypertension   ? takes Metoprolol and Enalapril daily  ? Hypothyroidism   ? takes Synthroid daily  ? IBS (irritable bowel syndrome)   ? Migraine   ? "nothing in the 2000s" (04/18/2018)  ? Mixed connective tissue disease ([place])   ? NAFLD (nonalcoholic fatty liver disease)   ? Pneumonia   ? "several times" (04/18/2018)  ? PVC's (premature ventricular contractions)    ? noted on event monitor 02/2021  ? Rheumatoid arthritis ([place])   ? "hands, elbows, shoulders, probably knees" (04/18/2018)  ? Scoliosis   ? Sleep apnea   ? mild - does not use cpap  ? Spondylosis   ? Type II diabetes

## 2022-01-10 NOTE — Telephone Encounter (Signed)
HH wanted to advise pcp that pt fell over the weekend, and no injuries have been recorded. Also blood sugar was 267 as of today.  ?

## 2022-01-11 ENCOUNTER — Encounter: Payer: Self-pay | Admitting: Family Medicine

## 2022-01-11 ENCOUNTER — Other Ambulatory Visit: Payer: Self-pay | Admitting: Family Medicine

## 2022-01-11 MED ORDER — METFORMIN HCL ER 500 MG PO TB24: 1000.0000 mg | ORAL_TABLET | Freq: Every day | ORAL | 2 refills | Status: DC

## 2022-01-11 MED ORDER — HUMULIN R U-500 (CONCENTRATED) 500 UNIT/ML ~~LOC~~ SOLN: SUBCUTANEOUS | 6 refills | Status: DC

## 2022-01-12 ENCOUNTER — Ambulatory Visit: Payer: 59

## 2022-01-12 ENCOUNTER — Telehealth: Payer: Self-pay | Admitting: Family Medicine

## 2022-01-12 NOTE — Telephone Encounter (Signed)
Spoke with Gyn office regarding [facility] in their facility and pt request to have cardiology labs done at this location.  Notified pt she [facility] Thursday 8 to 11:30am and before 4:30 in the afternoons.  Friday is 8 to 11:30am. Pt states she needs to go tomorrow morning. Spoke with Candise BowensJen at Cardiology office and requested lab order be placed as future and lab collect. She contact CMA while on the phone with me and said the orders were being placed and pt may go in the morning. Called and notified pt and she voices understanding. ?

## 2022-01-12 NOTE — Chronic Care Management (AMB) (Signed)
? Care Management ?  ? RN Visit Note ? ?01/12/2022 ?Name: [name] MRN: [id number] DOB: 07-13-1961 ? ?Subjective: ?[name] is a 61 y.o. year old female who is a primary care patient of Shelda Pal, DO. The care management team was consulted for assistance with disease management and care coordination needs.   ? ?Engaged with patient by telephone for follow up visit in response to provider referral for case management and/or care coordination services.  ? ?Consent to Services:  ? Ms. Van was given information about Care Management services today including:  ?Care Management services includes personalized support from designated clinical staff supervised by her physician, including individualized plan of care and coordination with other care providers ?24/7 contact phone numbers for assistance for urgent and routine care needs. ?The patient may stop case management services at any time by phone call to the office staff. ? ?Patient agreed to services and consent obtained.  ? ?Assessment: Review of patient past medical history, allergies, medications, health status, including review of consultants reports, laboratory and other test data, was performed as part of comprehensive evaluation and provision of chronic care management services.  ? ?SDOH (Social Determinants of Health) assessments and interventions performed:   ? ?Care Plan ? ?Allergies  ?Allergen Reactions  ? Fish Allergy Anaphylaxis  ?  INCLUDES OMEGA 3 OILS  ? Fish Oil Anaphylaxis and Swelling  ?  THROAT SWELLS ?INCLUDES FISH AS A CLASS  ? Omega-3 Fatty Acids Swelling  ?  Throat swelling  ?Has tolerated Glucerna nutritional drinks ?  ? Crestor [Rosuvastatin] Other (See Comments)  ?  Extreme joint pain, to this & other statins  ? Gabapentin Anxiety  ?  Anxiety on high doses  ? Lovastatin Other (See Comments)  ?  EXTREME JOINT PAIN  ? Metronidazole Nausea Only and Swelling  ?  Headache and shakes ?Nausea and vomiting  ? Red Yeast Rice  [Cholestin] Other (See Comments)  ?  Muscle pain and severe joint pain.   ? Rotigotine Swelling  ?  (Neupro) Extreme edema ?  ? Atrovent Hfa [Ipratropium Bromide Hfa] Other (See Comments)  ?  wheezing  ? Iodine Swelling  ? Other Other (See Comments)  ?  Surgical Staples causes redness/infection per patient.  ? Pepto-Bismol [Bismuth Subsalicylate] Nausea And Vomiting  ?  Extreme vomiting  ? Victoza [Liraglutide]   ?  Unsure of reaction   ? Enalapril Cough  ? Suprep [Na Sulfate-K Sulfate-Mg Sulf] Nausea And Vomiting  ? Tape Other (See Comments)  ?  Electrodes causes skin breakdown  ? ? ?Outpatient Encounter Medications as of 01/12/2022  ?Medication Sig Note  ? albuterol (PROAIR HFA) 108 (90 Base) MCG/ACT inhaler Inhale 2 puffs into the lungs every 4 (four) hours as needed for wheezing or shortness of breath.   ? albuterol (PROVENTIL) (2.5 MG/3ML) 0.083% nebulizer solution USE 1 VIAL IN NEBULIZER EVERY 4 HOURS AS NEEDED FOR WHEEZING FOR SHORTNESS OF BREATH   ? ascorbic acid (VITAMIN C) 500 MG tablet Take 1 tablet (500 mg total) by mouth 2 (two) times daily.   ? aspirin EC 81 MG tablet Take 1 tablet (81 mg total) by mouth every evening.   ? augmented betamethasone dipropionate (DIPROLENE AF) 0.05 % cream Apply topically 2 (two) times daily.   ? betamethasone dipropionate 0.05 % cream Apply 1 application. topically 2 (two) times daily as needed (extreme itching).   ? dapagliflozin propanediol (FARXIGA) 10 MG TABS tablet Take 1 tablet (10 mg total)  by mouth daily before breakfast.   ? diclofenac Sodium (VOLTAREN) 1 % GEL Apply 2 g topically 4 (four) times daily.   ? DILT-XR 240 MG 24 hr capsule Take 240 mg by mouth daily.   ? EPINEPHrine (EPIPEN 2-PAK) 0.3 mg/0.3 mL IJ SOAJ injection USE AS DIRECTED FOR SEVERE ALLERGIC REACTION.   ? ezetimibe (ZETIA) 10 MG tablet Take 1 tablet (10 mg total) by mouth daily.   ? fluconazole (DIFLUCAN) 150 MG tablet Take 1 tab, repeat in 72 hours if no improvement.   ? fluticasone (FLONASE) 50  MCG/ACT nasal spray USE 2 SPRAY(S) IN EACH NOSTRIL ONCE DAILY AS NEEDED FOR ALLERGIES OR  RHINITIS   ? furosemide (LASIX) 20 MG tablet Take 2 tablets (40 mg total) by mouth daily.   ? gabapentin (NEURONTIN) 400 MG capsule Take 1 capsule by mouth in the morning, 1 capsule at noon, and 2 capsules at bed time.   ? GLUCOMANNAN PO Take 1,995 mg by mouth in the morning, at noon, and at bedtime.   ? hydrocortisone cream 1 % Apply 1 application topically 3 (three) times daily as needed for itching (minor skin irritation).   ? hydroxychloroquine (PLAQUENIL) 200 MG tablet Take 1 tablet (200 mg total) by mouth 2 (two) times daily.   ? hydrOXYzine (ATARAX) 10 MG tablet Take 1 tablet (10 mg total) by mouth 3 (three) times daily as needed.   ? insulin regular human CONCENTRATED (HUMULIN R) 500 UNIT/ML injection INJECT 0.08-0.3 MLS (40-150 UNITS TOTAL) INTO THE SKIN THREE TIMES DAILY WITH MEALS   ? isosorbide mononitrate (IMDUR) 30 MG 24 hr tablet Take 1 tablet (30 mg total) by mouth daily.   ? levocetirizine (XYZAL) 5 MG tablet TAKE 1 TABLET BY MOUTH ONCE DAILY IN THE EVENING   ? levothyroxine (SYNTHROID) 200 MCG tablet TAKE 1 TABLET BY MOUTH ONCE DAILY BEFORE BREAKFAST   ? metFORMIN (GLUCOPHAGE-XR) 500 MG 24 hr tablet Take 2 tablets (1,000 mg total) by mouth daily with breakfast. Take 1 tab daily for the first week. 01/12/2022: Reports taking Metformin 500 mg 2 tablets twice a day  ? methocarbamol (ROBAXIN) 750 MG tablet Take 1 tablet (750 mg total) by mouth every 8 (eight) hours as needed for muscle spasms.   ? mometasone-formoterol (DULERA) 100-5 MCG/ACT AERO Inhale 2 puffs into the lungs 2 (two) times daily.   ? mupirocin ointment (BACTROBAN) 2 % Apply 1 application topically 2 (two) times daily as needed (wound care).   ? naloxone (NARCAN) nasal spray 4 mg/0.1 mL Place 1 spray into the nose as needed (opoid overdose).   ? nitroGLYCERIN (NITROSTAT) 0.4 MG SL tablet DISSOLVE ONE TABLET UNDER THE TONGUE EVERY 5 MINUTES AS NEEDED  FOR CHEST PAIN.  DO NOT EXCEED A TOTAL OF 3 DOSES IN 15 MINUTES   ? omeprazole (PRILOSEC) 40 MG capsule Take 1 capsule by mouth once daily   ? Oxycodone HCl 20 MG TABS Take 1 tablet (20 mg total) by mouth every 8 (eight) hours as needed.   ? oxymetazoline (AFRIN) 0.05 % nasal spray Place 2 sprays into both nostrils 2 (two) times daily as needed.   ? Polyethyl Glycol-Propyl Glycol (SYSTANE) 0.4-0.3 % SOLN Place 1-2 drops into both eyes in the morning.   ? pramipexole (MIRAPEX) 1 MG tablet TAKE 1 TABLET BY MOUTH THREE TIMES DAILY AND 1/2 (ONE-HALF) TWICE DAILY AS NEEDED FOR  BREAKTHROUGH  RESTLESS  LEG   ? pregabalin (LYRICA) 25 MG capsule Take 1 capsule by mouth  twice daily   ? promethazine (PHENERGAN) 25 MG tablet Take 1 tablet (25 mg total) by mouth every 8 (eight) hours as needed for vomiting or nausea.   ? saccharomyces boulardii (FLORASTOR) 250 MG capsule Take 250 mg by mouth 3 (three) times daily.   ? Vitamin D, Ergocalciferol, (DRISDOL) 1.25 MG (50000 UNIT) CAPS capsule TAKE 1 CAPSULE BY MOUTH ONCE A WEEK (EVERY  7  DAYS)  TAKE  ON  FRIDAY   ? glucose blood test strip 1 each by Other route 4 (four) times daily. Contour Next strips   ? INSULIN SYRINGE 1CC/29G (B-D INSULIN SYRINGE) 29G X 1/2" 1 ML MISC Use three times daily with insulin.  DX E11.9   ? ?No facility-administered encounter medications on file as of 01/12/2022.  ? ? ?Patient Active Problem List  ? Diagnosis Date Noted  ? Sacral decubitus ulcer, stage II ([place]) 12/24/2021  ? Class 3 obesity ([place]) 12/23/2021  ? Iron deficiency anemia 12/23/2021  ? Acute on chronic diastolic CHF (congestive heart failure) ([place]) 12/22/2021  ? Hyperkalemia 12/22/2021  ? Left leg cellulitis 12/20/2021  ? Peripheral vascular disease, unspecified ([place]) 10/10/2021  ? Unilateral AKA, right ([place]) 10/10/2021  ? Hx of AKA (above knee amputation), right ([place]) 07/22/2021  ? Dehiscence of amputation stump (HCC)   ? Acute blood loss anemia 06/24/2021  ? Postoperative wound infection  06/24/2021  ? UTI (urinary tract infection) 06/24/2021  ? 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 Bacteremia due to Enterococcus 05/28/2021  ? Sepsis ([place]) 09/15/202

## 2022-01-12 NOTE — Patient Instructions (Signed)
Visit Information ? ?Thank you for taking time to visit with me today. Please don't hesitate to contact me if I can be of assistance to you before our next scheduled telephone appointment. ? ?Following are the goals we discussed today:  ?(Copy and paste patient goals from clinical care plan here) ? ?Our next appointment is by telephone on 03/16/22 at 2:00 pm ? ?Please call the care guide team at [phone] if you need to cancel or reschedule your appointment.  ? ?If you are experiencing a Mental Health or Behavioral Health Crisis or need someone to talk to, please call the Suicide and Crisis Lifeline: 988 ?call 1-800-273-TALK (toll free, 24 hour hotline)  ? ?Patient verbalizes understanding of instructions and care plan provided today and agrees to view in MyChart. Active MyChart status confirmed with patient.   ? ?Telephone follow up appointment with care management team member scheduled for: ?The patient has been provided with contact information for the care management team and has been advised to call with any health related questions or concerns.  ? ?Kathyrn Sheriff, RN, MSN, BSN, CCM ?Care Management Coordinator ?[facility] ?[phone]  ?

## 2022-01-12 NOTE — Telephone Encounter (Signed)
Pt called wanting to see about doing her labs ordered by Cardio at our office instead of going all the way over to [facility] pt that since they were not ordered by a [facility] office that they would not be able to be done here due to billing complications it can cause. Lab personnel stated they would reach out to [facility] in our building to see if they can be done there. Advised pt that we would call back when we know more info. Please Advise.  ?

## 2022-01-13 ENCOUNTER — Other Ambulatory Visit: Payer: 59

## 2022-01-16 ENCOUNTER — Other Ambulatory Visit: Payer: Self-pay | Admitting: Family Medicine

## 2022-01-16 DIAGNOSIS — E559 Vitamin D deficiency, unspecified: Secondary | ICD-10-CM

## 2022-01-16 NOTE — Telephone Encounter (Signed)
Would you like Pt to continue ergocalciferol?  °

## 2022-01-20 ENCOUNTER — Encounter: Payer: Self-pay | Admitting: Family Medicine

## 2022-01-20 ENCOUNTER — Other Ambulatory Visit (INDEPENDENT_AMBULATORY_CARE_PROVIDER_SITE_OTHER): Payer: 59

## 2022-01-20 DIAGNOSIS — I10 Essential (primary) hypertension: Secondary | ICD-10-CM | POA: Diagnosis not present

## 2022-01-20 NOTE — Addendum Note (Signed)
Addended by: Kathi LudwigOUSAR, Karolyna Bianchini M on: 01/20/2022 03:12 PM ? ? Modules accepted: Orders ? ?

## 2022-01-20 NOTE — Addendum Note (Signed)
Addended by: Kathi LudwigOUSAR, Shantasia Hunnell M on: 01/20/2022 03:12 PM ? ? Modules accepted: Orders ? ?

## 2022-01-21 LAB — BASIC METABOLIC PANEL
BUN: 14 mg/dL (ref 7–25)
CO2: 26 mmol/L (ref 20–32)
Calcium: 9.1 mg/dL (ref 8.6–10.4)
Chloride: 101 mmol/L (ref 98–110)
Creat: 0.95 mg/dL (ref 0.50–1.05)
Glucose, Bld: 144 mg/dL — ABNORMAL HIGH (ref 65–99)
Potassium: 3.9 mmol/L (ref 3.5–5.3)
Sodium: 141 mmol/L (ref 135–146)

## 2022-01-22 ENCOUNTER — Encounter: Payer: Self-pay | Admitting: Family Medicine

## 2022-01-23 ENCOUNTER — Telehealth: Payer: Self-pay | Admitting: Family Medicine

## 2022-01-23 NOTE — Telephone Encounter (Signed)
Called [facility] informed of verbal ok 

## 2022-01-23 NOTE — Telephone Encounter (Signed)
Caller/Agency: [facility] ?Callback Number: [phone] ?Requesting OT/PT/Skilled Nursing/Social Work/Speech Therapy: nursing ?Frequency: 1 w 1 , 2 w 4 ?

## 2022-01-30 ENCOUNTER — Ambulatory Visit: Payer: 59 | Admitting: Pharmacist

## 2022-01-30 DIAGNOSIS — G629 Polyneuropathy, unspecified: Secondary | ICD-10-CM

## 2022-01-30 DIAGNOSIS — I10 Essential (primary) hypertension: Secondary | ICD-10-CM

## 2022-01-30 DIAGNOSIS — Z794 Long term (current) use of insulin: Secondary | ICD-10-CM

## 2022-01-30 NOTE — Chronic Care Management (AMB) (Signed)
Chronic Care Management Pharmacy Note  02/08/2022 Name:  [name] MRN:  [id number] DOB:  1961-03-15  Summary: Reviewed medication list and updated. Reviewed for duplicate therapies and drug-drug interactions 1 - Potential interaction between omeprazole and fluconazole. Effects: Plasma concentrations and pharmacologic effects of omeprazole may be increased by strong CYP2C19 inhibitors (eg, fluconazole). Recommended management: Monitor for increased omeprazole effects and toxicities if combined with strong CYP2C19 inhibitors. Dose adjustment of omeprazole is not normally required.  2 - Discuss duplicate therapy of cyclobenzaprine and methocarbamol. Both are muscle relaxers. Patient reports she does not take these medications together.  3 - Discussed duplicate therapy of pregabalin and gabapentin  4 - discussed duplication therapy of cetirizine and hydroxyzine - both have antihistamine properties. Recommended she not take these medications at the same time.  Patient reports insulin and Advair have been affordable. Reminded to get annual eye exam.     Subjective: [name] is an 61 y.o. year old female who is a primary patient of Shelda Pal, DO.  The CCM team was consulted for assistance with disease management and care coordination needs.    Engaged with patient by telephone for follow up visit in response to provider referral for pharmacy case management and/or care coordination services.   Consent to Services:  The patient was given information about Chronic Care Management services, agreed to services, and gave verbal consent prior to initiation of services.  Please see initial visit note for detailed documentation.   Patient Care Team: Shelda Pal, DO as PCP - General (Family Medicine) Sueanne Margarita, MD as PCP - Cardiology (Cardiology) Luretha Rued, RN as Case Manager Cherre Robins, RPH-CPP (Pharmacist)  Recent office visits: 11/16/2021 -  PCP (Dr Nani Ravens) Video Visit for possible UTI and eczema flair / allergies. Rx for Betamethasone Dipropionate Aug 0.05 % Topical 2 times daily and Nitrofurantoin Monohyd Macro 100 mg Oral 2 times daily 11/14/2021 - Fam Med - phone call / My Chart message. Refill mupirocin ointment. recommended Video Visit 10/10/2021 - PCP (Dr Nani Ravens) Seen for cellulitis. Prescribed Sulfamethoxazole-Trimethoprim 800-160 MG 1 tablet Oral 2 times daily for 10 days. Patient has already started cephalexin 530m 3 times a day at home. 07/05/2021 - [place] - started omeperazole; Changed pramipexole to 123m3 times a day and 0.5m55mwice a day if needed for breakthrough restless leg syndrome. stopped esomeprazole 68m5mRecent consult visits: 01/02/2022 - Cardio ([facility]) [place] Chronic diastolic heart failure. Recent admission for heart failure exacerbation secondary to noncompliance with medications. Currently takes Lasix 40 mg twice daily and spironolactone 25 mg daily. Recheck BMP.  Not a good candidate for SG LT 2 inhibitor.  Coronary artery disease. No chest pain.  Continue aspirin, Zetia and Imdur; HLD - History of statin intolerance 12/20/2021 - Physical med - pain management (ThoMarcello Moores) chronic pain syndrome. Today she has redness and drainage to left lower extremity with edema noted. Patient sent to ED.  11/21/2021 - Physical med - pain management (ThoMarcello Moores) Chronic  Pain Syndrome : Refilled: Oxycodone 20 mg one tablet every 8 hours as needed for pain #90. We will continue the opioid monitoring program.   10/24/2021 - Physical Med and Rehab (ThoMarcello Moores)F/U pain managment. Pain in neck, shoulders, lower back and right stump. No med changes. continue current pharmacotherapy for pain management.  10/12/2021 - Ortho Surgery (ZamCoralyn Pear) F/U AKA of right leg. Changed diphenhydramine to hydroxyzine 10mg44mimes a day as needed. F/U  2 weeks.  08/25/2021 - Ortho Surgery (Dr Sharol Given) F/U Right AKA 07/22/2021. Continue with  protein supplements. F/U in 4 weeks.   08/18/2021 - Pain Mangement Marcello Moores, NP) Follow above the knee amputation. decreased dose of oxycodone to $RemoveBefo'20mg'dhGXvBPAepJ$  every 8 hours as needed.  08/03/2021 - Pain Management - phone call. Lidocaine 5% patches denied by insurance.   Hospital visits: 12/20/2021 to 12/27/2021 [facility] Admission for left leg cellulitis and ulcer - sacral decubitus stage II; acute on chronic CHF.  Medications stopped at discharge - cyclobenzaprine $RemoveBeforeDEI'10mg'sUDjZoJODCToXMVt$ , tizanidien $RemoveBefo'2mg'huyxuHFzaZn$  and valsartan $RemoveBefor'160mg'pwRmgBYRYRWj$ .  Medications changed at discharge: furosemide increased to $RemoveBefo'40mg'XmGRqYQZwnX$  twice a day for 3 days, then take $RemoveB'40mg'ZWlLbsya$  daily   Objective:  Lab Results  Component Value Date   CREATININE 0.95 01/20/2022   CREATININE 1.52 (H) 01/09/2022   CREATININE 1.46 (H) 01/06/2022    Lab Results  Component Value Date   HGBA1C 8.5 (H) 12/22/2021   Last diabetic Eye exam: No results found for: HMDIABEYEEXA  Last diabetic Foot exam: No results found for: HMDIABFOOTEX      Component Value Date/Time   CHOL 194 10/27/2020 0809   CHOL 176 05/04/2020 0947   TRIG 223.0 (H) 10/27/2020 0809   HDL 46.60 10/27/2020 0809   HDL 41 05/04/2020 0947   CHOLHDL 4 10/27/2020 0809   VLDL 44.6 (H) 10/27/2020 0809   LDLCALC 100 (H) 05/04/2020 0947   LDLDIRECT 121.0 10/27/2020 0809       Latest Ref Rng & Units 12/23/2021    2:35 AM 12/20/2021   12:30 PM 06/09/2021    5:42 AM  Hepatic Function  Total Protein 6.5 - 8.1 g/dL 6.9   7.0   6.4    Albumin 3.5 - 5.0 g/dL 3.0   3.1   2.5    AST 15 - 41 U/L 37   26   51    ALT 0 - 44 U/L 26   24   39    Alk Phosphatase 38 - 126 U/L 87   84   63    Total Bilirubin 0.3 - 1.2 mg/dL 0.3   0.4   0.6      Lab Results  Component Value Date/Time   TSH 2.170 05/04/2020 09:47 AM   TSH 0.624 10/20/2018 02:50 AM   TSH 1.388 01/02/2018 06:09 PM   TSH 2.451  Test methodology is 3rd generation TSH  12/06/2008 04:55 AM       Latest Ref Rng & Units 01/06/2022   12:15 PM 12/27/2021    3:21 AM  12/26/2021    4:55 AM  CBC  WBC 4.0 - 10.5 K/uL 9.8   10.4   9.1    Hemoglobin 12.0 - 15.0 g/dL 9.8   9.2   8.8    Hematocrit 36.0 - 46.0 % 32.9   32.9   30.0    Platelets 150.0 - 400.0 K/uL 292.0   287   294      Lab Results  Component Value Date/Time   VD25OH 69.04 07/25/2017 11:55 AM    Clinical ASCVD: Yes  The 10-year ASCVD risk score (Arnett DK, et al., 2019) is: 11.9%   Values used to calculate the score:     Age: 38 years     Sex: Female     Is Non-Hispanic African American: No     Diabetic: Yes     Tobacco smoker: No     Systolic Blood Pressure: 357 mmHg     Is BP treated: Yes  HDL Cholesterol: 46.6 mg/dL     Total Cholesterol: 194 mg/dL     Social History   Tobacco Use  Smoking Status Former   Packs/day: 1.00   Years: 19.00   Pack years: 19.00   Types: Cigarettes   Start date: 46   Quit date: 02/11/2004   Years since quitting: 18.0   Passive exposure: Past  Smokeless Tobacco Never   BP Readings from Last 3 Encounters:  01/31/22 (!) 147/69  01/09/22 124/62  01/06/22 118/72   Pulse Readings from Last 3 Encounters:  01/31/22 83  01/09/22 80  01/06/22 84   Wt Readings from Last 3 Encounters:  01/31/22 291 lb 9.6 oz (132.3 kg)  01/02/22 296 lb 9.6 oz (134.5 kg)  12/27/21 285 lb 4.8 oz (129.4 kg)    Assessment: Review of patient past medical history, allergies, medications, health status, including review of consultants reports, laboratory and other test data, was performed as part of comprehensive evaluation and provision of chronic care management services.   SDOH:  (Social Determinants of Health) assessments and interventions performed:    CCM Care Plan  Allergies  Allergen Reactions   Fish Allergy Anaphylaxis    INCLUDES OMEGA 3 OILS   Fish Oil Anaphylaxis and Swelling    THROAT SWELLS INCLUDES FISH AS A CLASS   Omega-3 Fatty Acids Swelling    Throat swelling  Has tolerated Glucerna nutritional drinks    Crestor [Rosuvastatin] Other  (See Comments)    Extreme joint pain, to this & other statins   Gabapentin Anxiety    Anxiety on high doses   Lovastatin Other (See Comments)    EXTREME JOINT PAIN   Metronidazole Nausea Only and Swelling    Headache and shakes Nausea and vomiting   Red Yeast Rice [Cholestin] Other (See Comments)    Muscle pain and severe joint pain.    Rotigotine Swelling    (Neupro) Extreme edema    Atrovent Hfa [Ipratropium Bromide Hfa] Other (See Comments)    wheezing   Iodine Swelling   Other Other (See Comments)    Surgical Staples causes redness/infection per patient.   Pepto-Bismol [Bismuth Subsalicylate] Nausea And Vomiting    Extreme vomiting   Victoza [Liraglutide]     Unsure of reaction    Enalapril Cough   Suprep [Na Sulfate-K Sulfate-Mg Sulf] Nausea And Vomiting   Tape Other (See Comments)    Electrodes causes skin breakdown    Medications Reviewed Today     Reviewed by Casilda Carls, CMA (Certified Medical Assistant) on 01/31/22 at 1017  Med List Status: <None>   Medication Order Taking? Sig Documenting Provider Last Dose Status Informant  acetaminophen (TYLENOL) 500 MG tablet [id number] Yes Take 500 mg by mouth every 8 (eight) hours as needed. [provider] Taking Active   albuterol (PROAIR HFA) 108 (90 Base) MCG/ACT inhaler [id number] Yes Inhale 2 puffs into the lungs every 4 (four) hours as needed for wheezing or shortness of breath. Shelda Pal, DO Taking Active Self, Spouse/Significant Other  albuterol (PROVENTIL) (2.5 MG/3ML) 0.083% nebulizer solution [id number] Yes USE 1 VIAL IN NEBULIZER EVERY 4 HOURS AS NEEDED FOR WHEEZING FOR SHORTNESS OF BREATH Shelda Pal, DO Taking Active Self, Spouse/Significant Other  ascorbic acid (VITAMIN C) 500 MG tablet [id number] Yes Take 1 tablet (500 mg total) by mouth 2 (two) times daily. Bary Leriche, PA-C Taking Active Self, Spouse/Significant Other  aspirin EC 81 MG tablet [id number] Yes Take 1 tablet  (81 mg total)  by mouth every evening. Shelda Pal, DO Taking Active Self, Spouse/Significant Other  augmented betamethasone dipropionate (DIPROLENE AF) 0.05 % cream [id number] Yes Apply topically 2 (two) times daily. Shelda Pal, DO Taking Active Self, Spouse/Significant Other  cyclobenzaprine (FLEXERIL) 10 MG tablet [id number] Yes Take 10 mg by mouth 3 (three) times daily as needed for muscle spasms. [provider] Taking Active   dapagliflozin propanediol (FARXIGA) 10 MG TABS tablet [id number] Yes Take 1 tablet (10 mg total) by mouth daily before breakfast. Shelda Pal, DO Taking Active   diclofenac Sodium (VOLTAREN) 1 % GEL [id number] Yes Apply 2 g topically 4 (four) times daily. Bary Leriche, PA-C Taking Active Self, Spouse/Significant Other           Med Note Marinell Blight Dec 20, 2021  8:08 PM)    DILT-XR 240 MG 24 hr capsule [id number] Yes Take 240 mg by mouth daily. [provider] Taking Active Self, Spouse/Significant Other  EPINEPHrine (EPIPEN 2-PAK) 0.3 mg/0.3 mL IJ SOAJ injection [id number] Yes USE AS DIRECTED FOR SEVERE ALLERGIC REACTION. Shelda Pal, DO Taking Active Self, Spouse/Significant Other  ezetimibe (ZETIA) 10 MG tablet [id number] Yes Take 1 tablet (10 mg total) by mouth daily. Shelda Pal, DO Taking Active Self, Spouse/Significant Other  fluconazole (DIFLUCAN) 150 MG tablet [id number] Yes Take 1 tab, repeat in 72 hours if no improvement. Shelda Pal, DO Taking Active   fluticasone [facility]) 50 MCG/ACT nasal spray [id number] Yes USE 2 SPRAY(S) IN EACH NOSTRIL ONCE DAILY AS NEEDED FOR ALLERGIES OR  RHINITIS Shelda Pal, DO Taking Active Self, Spouse/Significant Other  furosemide (LASIX) 20 MG tablet [id number] Yes Take 2 tablets (40 mg total) by mouth daily. Leanor Kail, PA Taking Active   gabapentin (NEURONTIN) 400 MG capsule [id number] Yes Take 1 capsule by mouth in  the morning, 1 capsule at noon, and 2 capsules at bed time. Shelda Pal, DO Taking Active Self, Spouse/Significant Other  GLUCOMANNAN PO [id number] Yes Take 1,995 mg by mouth in the morning, at noon, and at bedtime. [provider] Taking Active Self, Spouse/Significant Other  glucose blood test strip [id number] Yes 1 each by Other route 4 (four) times daily. Contour Next strips Love, Ivan Anchors, PA-C Taking Active Self, Spouse/Significant Other  guaiFENesin (MUCINEX) 600 MG 12 hr tablet [id number] Yes Take by mouth 2 (two) times daily as needed. [provider] Taking Active   hydrocortisone cream 1 % [id number] Yes Apply 1 application topically 3 (three) times daily as needed for itching (minor skin irritation). Bary Leriche, PA-C Taking Active Self, Spouse/Significant Other  hydroxychloroquine (PLAQUENIL) 200 MG tablet [id number] Yes Take 1 tablet (200 mg total) by mouth 2 (two) times daily. Shelda Pal, DO Taking Active   hydrOXYzine (ATARAX) 10 MG tablet [id number] Yes Take 1 tablet (10 mg total) by mouth 3 (three) times daily as needed. Suzan Slick, NP Taking Active Self, Spouse/Significant Other  insulin regular human CONCENTRATED (HUMULIN R) 500 UNIT/ML injection [id number] Yes INJECT 0.08-0.3 MLS (40-150 UNITS TOTAL) INTO THE SKIN THREE TIMES DAILY WITH MEALS [place], Crosby Oyster, DO Taking Active   INSULIN SYRINGE 1CC/29G (B-D INSULIN SYRINGE) 29G X 1/2" 1 ML MISC [id number] Yes Use three times daily with insulin.  DX E11.9 Shelda Pal, DO Taking Active Self, Spouse/Significant Other  isosorbide mononitrate (IMDUR) 30 MG 24 hr tablet [id number] Yes Take 1 tablet (30 mg total) by mouth daily. Sueanne Margarita, MD Taking  Active Self, Spouse/Significant Other  levocetirizine (XYZAL) 5 MG tablet [id number] Yes TAKE 1 TABLET BY MOUTH ONCE DAILY IN THE EVENING [place], Crosby Oyster, DO Taking Active Self, Spouse/Significant Other  levothyroxine  (SYNTHROID) 200 MCG tablet [id number] Yes TAKE 1 TABLET BY MOUTH ONCE DAILY BEFORE BREAKFAST [place], Crosby Oyster, DO Taking Active Self, Spouse/Significant Other  magnesium oxide (MAG-OX) 400 MG tablet [id number] Yes Take 400 mg by mouth daily as needed (cramps). [provider] Taking Active   metFORMIN (GLUCOPHAGE-XR) 500 MG 24 hr tablet [id number] Yes Take 2 tablets (1,000 mg total) by mouth daily with breakfast. Take 1 tab daily for the first week. Shelda Pal, DO Taking Active            Med Note Antony Contras, Jenene Slicker Jan 30, 2022  3:38 PM)    methocarbamol (ROBAXIN) 750 MG tablet [id number] Yes Take 1 tablet (750 mg total) by mouth every 8 (eight) hours as needed for muscle spasms. Domenic Polite, MD Taking Active   mometasone-formoterol [facility]) 100-5 MCG/ACT Hollie Salk [id number] Yes Inhale 2 puffs into the lungs 2 (two) times daily. [provider] Taking Active Self           Med Note Antony Contras, Jenene Slicker Jan 30, 2022  3:51 PM) Plans to change to generic Adviar but has 1 inhaler from hospitalzation and will use Dulera first.   Multiple Vitamins-Minerals (WOMENS 50+ MULTI VITAMIN PO) [id number] Yes Take 1 tablet by mouth daily. [provider] Taking Active   mupirocin ointment (BACTROBAN) 2 % [id number] Yes Apply 1 application topically 2 (two) times daily as needed (wound care). Shelda Pal, DO Taking Active Self, Spouse/Significant Other  naloxone [facility]) nasal spray 4 mg/0.1 mL [id number] No Place 1 spray into the nose as needed (opoid overdose).  Patient not taking: Reported on 01/30/2022   [provider] Not Taking Active Self, Spouse/Significant Other  nitroGLYCERIN (NITROSTAT) 0.4 MG SL tablet [id number] No DISSOLVE ONE TABLET UNDER THE TONGUE EVERY 5 MINUTES AS NEEDED FOR CHEST PAIN.  DO NOT EXCEED A TOTAL OF 3 DOSES IN 15 MINUTES  Patient not taking: Reported on 01/30/2022   Sueanne Margarita, MD Not Taking Active Self,  Spouse/Significant Other  omeprazole (PRILOSEC) 40 MG capsule [id number] Yes Take 1 capsule by mouth once daily Shelda Pal, DO Taking Active Self, Spouse/Significant Other  Oxycodone HCl 20 MG TABS [id number] Yes Take 1 tablet (20 mg total) by mouth every 8 (eight) hours as needed. Bayard Hugger, NP Taking Active Self, Spouse/Significant Other           Med Note Casilda Carls   Tue Jan 31, 2022 10:17 AM) Last fill 12/23/21 #90 Today #0 per patient at home. Last taken last night  BHJ 01/31/22  oxymetazoline (AFRIN) 0.05 % nasal spray [id number] Yes Place 2 sprays into both nostrils 2 (two) times daily as needed. [provider] Taking Active Self, Spouse/Significant Other  Polyethyl Glycol-Propyl Glycol (SYSTANE) 0.4-0.3 % SOLN [id number] Yes Place 1-2 drops into both eyes in the morning. [provider] Taking Active Self, Spouse/Significant Other  pramipexole (MIRAPEX) 1 MG tablet [id number] Yes TAKE 1 TABLET BY MOUTH THREE TIMES DAILY AND 1/2 (ONE-HALF) TWICE DAILY AS NEEDED FOR  BREAKTHROUGH  RESTLESS  LEG Shelda Pal, DO Taking Active Self, Spouse/Significant Other  pregabalin (LYRICA) 25 MG capsule [id number] Yes Take 1 capsule by mouth twice daily Bayard Hugger, NP Taking Active   promethazine (PHENERGAN)  25 MG tablet [id number] Yes Take 1 tablet (25 mg total) by mouth every 8 (eight) hours as needed for vomiting or nausea. Shelda Pal, DO Taking Active Self, Spouse/Significant Other  saccharomyces boulardii (FLORASTOR) 250 MG capsule [id number] Yes Take 250 mg by mouth 3 (three) times daily. [provider] Taking Active Self, Spouse/Significant Other           Med Note Barbaraann Boys Jan 30, 2022  3:40 PM) Only takes when she takes antibiotic  Vitamin D, Ergocalciferol, (DRISDOL) 1.25 MG (50000 UNIT) CAPS capsule [id number] Yes TAKE 1 CAPSULE BY MOUTH ONCE A WEEK -TAKE  ON  FRIDAY Shelda Pal, DO Taking Active    ZINC OXIDE, TOPICAL, 10 % CREA [id number] Yes Apply topically. [provider] Taking Active   ZINC SULFATE PO [id number] Yes Take 255 mg by mouth daily. [provider] Taking Active             Patient Active Problem List   Diagnosis Date Noted   Sacral decubitus ulcer, stage II ([place]) 12/24/2021   Class 3 obesity ([place]) 12/23/2021   Iron deficiency anemia 12/23/2021   Acute on chronic diastolic CHF (congestive heart failure) ([place]) 12/22/2021   Hyperkalemia 12/22/2021   Left leg cellulitis 12/20/2021   Peripheral vascular disease, unspecified ([place]) 10/10/2021   Unilateral AKA, right ([place]) 10/10/2021   Hx of AKA (above knee amputation), right ([place]) 07/22/2021   Dehiscence of amputation stump (HCC)    Acute blood loss anemia 06/24/2021   Postoperative wound infection 06/24/2021   UTI (urinary tract infection) 06/24/2021   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 ([place])    Bacteremia due to Enterococcus 05/28/2021   Sepsis ([place]) 05/26/2021   Digestive disorder 05/18/2021   Bilateral lower extremity edema 05/02/2021   Statin myopathy 03/04/2021   Aortic stenosis 03/04/2021   Sjogren's syndrome ([place]) 02/11/2020   Cutaneous abscess of right foot    Statin intolerance 08/16/2019   Gram-negative bacteremia 10/18/2018   Streptococcal bacteremia 10/18/2018   CAD S/P percutaneous coronary angioplasty 04/19/2018   Essential hypertension    Moderate persistent asthma 03/21/2018   NAFLD (nonalcoholic fatty liver disease)    Recurrent cellulitis of lower extremity 01/02/2018   Cellulitis of lower leg 01/02/2018   Chronic diarrhea 05/08/2017   H/O Clostridium difficile infection 05/08/2017   Rectal bleeding 05/08/2017   Hyperbilirubinemia 04/29/2016   Hyponatremia 04/29/2016   Cellulitis of right foot 03/09/2016   Allergic rhinitis due to pollen 02/15/2016   Anaphylactic reaction due to food 02/10/2016   Type 2 diabetes mellitus with  hyperglycemia, with long-term current use of insulin ([place]) 02/10/2016   Gastroesophageal reflux disease without esophagitis 02/10/2016   Atopic eczema 02/10/2016   Cervical nerve root disorder 12/15/2015   Lumbar radiculopathy 12/15/2015   Daytime somnolence 11/26/2015   Venous stasis dermatitis of both lower extremities 02/11/2015   Morbid obesity ([place]) 06/19/2014   Abnormal LFTs 03/26/2014   B-complex deficiency 03/26/2014   Benign essential HTN 03/26/2014   Chronic pain associated with significant psychosocial dysfunction 03/26/2014   Diaphragmatic hernia 03/26/2014   Gastroesophageal reflux disease 03/26/2014   Hammer toe 03/26/2014   H/O neoplasm 03/26/2014   Adaptive colitis 03/26/2014   Deafness, sensorineural 03/26/2014   Fibromyalgia 01/20/2014   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/20/2014   Hyperlipidemia 11/11/2012   Mixed connective tissue disease ([place]) 11/11/2012   Hypothyroidism 81/44/8185   Uncomplicated asthma 63/14/9702   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/2012   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   ANA positive 07/17/2011    Immunization History  Administered Date(s) Administered   Influenza,inj,Quad PF,6+ Mos 06/30/2015   Pneumococcal Polysaccharide-23 02/25/2014    Conditions to be addressed/monitored: CAD, HTN, HLD, DMII, Asthma, Hypothyroidism, and mixed connective tissue disease, chronic pain,   Care Plan : [place]  Updates made by Cherre Robins, RPH-CPP since 02/08/2022 12:00 AM     Problem: Diabetes; neuropathy; HDL; HTN; CAD;  chronic pain      Long-Range Goal: Provide education, support and care coordination for medication therapy and chronic conditions   Start Date: 08/23/2021  Priority: High  Note:   Current Barriers:  Unable to independently afford treatment regimen Unable to achieve control of hyperlipidemia and type 2 DM  Does not  adhere to prescribed medication regimen  Pharmacist Clinical Goal(s):  Over the next 90 days, patient will verbalize ability to afford treatment regimen achieve control of type 2 DM and hyperlipidemia as evidenced by A1c < 7.0% and LDL <70 adhere to prescribed medication regimen as evidenced by refill history and attainment of treatment goals  through collaboration with PharmD and provider.   Interventions: 1:1 collaboration with Shelda Pal, DO regarding development and update of comprehensive plan of care as evidenced by provider attestation and co-signature Inter-disciplinary care team collaboration (see longitudinal plan of care) Comprehensive medication review performed; medication list updated in electronic medical record  Diabetes: Uncontrolled; A1c improving; A1c goal < 7.0% Current treatment: Humulin R U-500 - inject 3 to 40 units 3 times a day Metformin ER 550m - take 2 tablets twice a day Previously has taken: Novolog and Semglee / glargine insulin Patient states insulin can sometimes cost over $300. Patient has high deductible health plan. [facility]. Reports that cost of insulin for 2023 has decrease and is affordable.  Denies hypoglycemic/hyperglycemic symptoms Current exercise: none at this time Interventions:  Reviewed hypoglycemia treatment.  Reviewed home blood glucose readings and reviewed goals  Fasting blood glucose goal (before meals) = 80 to 130 Blood glucose goal after a meal = less than 180   Hyperlipidemia / CAD: Uncontrolled; LDL goal <70 Adherence improving since our last visit. Current treatment: Ezetimibe 165mdaily  Aspirin 8133maily  Isosorbide ER 36m10mily  Noted to be statin intolerant - Medications previously tried: rosuvastatin (extreme joint pain), lovastatin (joint pain), Red Yeast Rice (muscle and joint pain), Fish Oil (anaphylaxis and swelling); Nexlizet (stopped due to cost)   Interventions:  Ezetimibe, aspirin and isosorbide are all tier 1 (preventative) with [employer] - should be $0 Recommend recheck Lipids in 4 to 6 weeks.  Discussed LDL goal  Hypertension:  Controlled; Last 2 of 3 office blood pressure readings have been at goal Blood pressure goal <140/90 Current therapy:  Valsartan 160mg76mly  Diltiazem 240mg 40my  Interventions:  Discussed blood pressure goals Reviewed formulary - valsartan is tier 1 (preventative and will be $0); Diltiazem is tier 2 (unable to determine exact cost) Continue current therapy for hypertension   Asthma: Uncontrolled; Goal: decrease number of days per month rescue inhaler is needed.  Current treatment: Albuterol nebulization or inhaler - use as needed up to every 4 hours as needed for wheezing and shortness of breath Fluticasone / salmeterol 250/50mcg 26mhaler 1 puff twice a day (not taking due to cost)  Rescue inhaler use: uses  very frequently; on a daily basis Maintenance inhaler: not currently prescribed. Per patient she has tried in past but maintenance inhalers have not been effective though she does also admit that cost has been an issue with adherence.  Past inhalers tried:  Xopenex (ineffective), Symbicort (ineffective/low adherence), Advair (ineffective/low adherence) Interventions:  Discussed benefits of maintenance inhalers in controlling asthma and preventing exacerbations.  Mailed application for Symbicort patient assistance program at last visit.  Reminded to complete Reviewed 2023 benefits with [employer]. Looks like Advair Diskus or HFA are tier 1. Sent in Rx at last visit and cost was $110 but pharmacy filled for generic Advair. Coordinated with pharmacy today and they retried with brand Adviar. Brand was covered $0!!  Medication management Pharmacist Clinical Goal(s): Over the next 90 days, patient will work with PharmD and providers to maintain optimal medication adherence Current  pharmacy: [facility] Reviewed patient's 2023 formulary. Most medications on her list are tier 1 / or tier 1 preventative. Tier 1 preventative will be $0.  Tier 1 meds include: albuterol inhaler, cyclobenzaprine, ergocalciferol, ezetimibe; fluconazole, Advair, folic acid, furosemide, gabapentin, hydroxyzine, isosorbide mononitrate, levocetirizine, aspirin, levothyroxine, metformin, mupirocin, NTG, omeprazole, pramipexole, pregabalin, promethazine, valsartan Tier 2 meds include: betamethasone, diltiazem, epinephrine, hydroxychloroquine, naloxone, oxycodone,  Tier 4 med: santyl, Humulin u500 Non formulary: potassium bicarbonate  Patient asked about cost of refill for naloxone nasal spray - would be $90 per pharmacy  Interventions Comprehensive medication review performed. Reviewed refill history and assessed adherence Continue current medication management strategy Coordinated refill for nitroglycerin - tier 1 preventative $0 Message sent to PCP regarding refill for folic acid.   Patient Goals/[name] Activities Over the next 90 days, patient will:  take medications as prescribed,  check glucose 3 to 4 times per day, document, and provide at future appointments,  collaborate with provider on medication access solutions, and   Follow Up Plan: Telephone follow up appointment with care management team member scheduled for:  4 to 6 weeks.           Medication Assistance: Application for Humulin R and possible Symbicort (she will retry if approved for patient assistance program)   medication assistance program. in process.  Anticipated assistance start date 12/24/2021.  See plan of care for additional detail.  Patient's preferred pharmacy is:  [place], [place] - [address] [place] [address] Phone: [phone] Fax: [phone]   Follow Up:  Patient agrees to Care Plan and Follow-up.  Plan: Telephone follow up appointment with  care management team member scheduled for:  1 month  Cherre Robins, PharmD Clinical Pharmacist [facility] Primary [facility] SW [facility]

## 2022-01-31 ENCOUNTER — Encounter: Payer: 59 | Attending: Registered Nurse | Admitting: Registered Nurse

## 2022-01-31 ENCOUNTER — Encounter: Payer: Self-pay | Admitting: Registered Nurse

## 2022-01-31 ENCOUNTER — Other Ambulatory Visit: Payer: Self-pay | Admitting: Registered Nurse

## 2022-01-31 VITALS — BP 147/69 | HR 83 | Ht 62.0 in | Wt 291.6 lb

## 2022-01-31 DIAGNOSIS — M546 Pain in thoracic spine: Secondary | ICD-10-CM

## 2022-01-31 DIAGNOSIS — M5412 Radiculopathy, cervical region: Secondary | ICD-10-CM

## 2022-01-31 DIAGNOSIS — Z5181 Encounter for therapeutic drug level monitoring: Secondary | ICD-10-CM

## 2022-01-31 DIAGNOSIS — E1142 Type 2 diabetes mellitus with diabetic polyneuropathy: Secondary | ICD-10-CM

## 2022-01-31 DIAGNOSIS — M545 Low back pain, unspecified: Secondary | ICD-10-CM | POA: Diagnosis not present

## 2022-01-31 DIAGNOSIS — Z89611 Acquired absence of right leg above knee: Secondary | ICD-10-CM | POA: Diagnosis not present

## 2022-01-31 DIAGNOSIS — M2550 Pain in unspecified joint: Secondary | ICD-10-CM

## 2022-01-31 DIAGNOSIS — G546 Phantom limb syndrome with pain: Secondary | ICD-10-CM

## 2022-01-31 DIAGNOSIS — G894 Chronic pain syndrome: Secondary | ICD-10-CM

## 2022-01-31 DIAGNOSIS — M542 Cervicalgia: Secondary | ICD-10-CM | POA: Diagnosis not present

## 2022-01-31 DIAGNOSIS — G8929 Other chronic pain: Secondary | ICD-10-CM

## 2022-01-31 DIAGNOSIS — Z79899 Other long term (current) drug therapy: Secondary | ICD-10-CM

## 2022-01-31 MED ORDER — OXYCODONE HCL 20 MG PO TABS: 1.0000 | ORAL_TABLET | Freq: Three times a day (TID) | ORAL | 0 refills | Status: DC | PRN

## 2022-01-31 NOTE — Progress Notes (Signed)
Subjective:    Patient ID: [name], female    DOB: 1960/10/29, 61 y.o.   MRN: [id number]  HPI: [name] is a 61 y.o. female who called office asking for a My-chart visit due to lack of transportation, we have discussed the limitations of evaluation and management by telemedicine and the availability of in person appointments. The patient expressed understanding and agreed to proceed. She states her  pain is located in her neck radiating into her right shoulder, mid- lower back pain and phantom pain. She also reported tingling and burning in her bilateral hands and generalized joint pain. She rates her pain 6. Her current exercise regime is walking with walker in her home.   [name] was admitted to hospital on 04/07-2022 and discharged on 12/27/2021 for Acute on Chronic Diastolic CHF and Left Leg Cellulitis, discharged summary was reviewed.    [name] equivalent is 63.00.   Pain Inventory Average Pain 6 Pain Right Now 6 My pain is constant, stabbing, and aching  In the last 24 hours, has pain interfered with the following? General activity 0 Relation with others 0 Enjoyment of life 2 What TIME of day is your pain at its worst? morning , daytime, evening, and night Sleep (in general) Poor  Pain is worse with: walking, bending, sitting, inactivity, standing, and some activites Pain improves with: heat/ice, medication, and Voltaren Relief from Meds: 32  Family History  Problem Relation Age of Onset   CAD Mother    Hypertension Mother    Heart attack Mother    CAD Father    Heart attack Father    Allergic rhinitis Father    Asthma Father    Hypertension Brother    Hypertension Brother    Pancreatic cancer Paternal Aunt    Breast cancer Paternal Aunt    Lung cancer Paternal Aunt    Asthma Son    Social History   Socioeconomic History   Marital status: Married    Spouse name: [name] Esquibel   Number of children: 1   Years of education: 11    Highest education level: GED or equivalent  Occupational History    Comment: Not currrently working ( last worked 2004)  Tobacco Use   Smoking status: Former    Packs/day: 1.00    Years: 19.00    Pack years: 19.00    Types: Cigarettes    Start date: 2    Quit date: 02/11/2004    Years since quitting: 17.9    Passive exposure: Past   Smokeless tobacco: Never  Vaping Use   Vaping Use: Never used  Substance and Sexual Activity   Alcohol use: Yes    Comment: RARE Wine   Drug use: Not Currently    Types: Marijuana    Comment: "only in my teens"   Sexual activity: Yes    Birth control/protection: Surgical    Comment: Hysterectomy  Other Topics Concern   Not on file  Social History Narrative   Not on file   Social Determinants of Health   Financial Resource Strain: Low Risk    Difficulty of Paying Living Expenses: Not hard at all  Food Insecurity: No Food Insecurity   Worried About Charity fundraiser in the Last Year: Never true   Ran Out of Food in the Last Year: Never true  Transportation Needs: No Transportation Needs   Lack of Transportation (Medical): No   Lack of Transportation (Non-Medical): No  Physical Activity: Inactive  Days of Exercise per Week: 0 days   Minutes of Exercise per Session: 0 min  Stress: Stress Concern Present   Feeling of Stress : Very much  Social Connections: Moderately Integrated   Frequency of Communication with Friends and Family: More than three times a week   Frequency of Social Gatherings with Friends and Family: More than three times a week   Attends Religious Services: More than 4 times per year   Active Member of Clubs or Organizations: No   Attends Archivist Meetings: Never   Marital Status: Married   Past Surgical History:  Procedure Laterality Date   ABDOMINAL HYSTERECTOMY  06/2005   "w/right ovariy"   AMPUTATION Right 01/28/2020    RIGHT FOURTH AND FIFTH RAY AMPUTATION   AMPUTATION Right 01/28/2020    Procedure: RIGHT FOURTH AND FIFTH RAY AMPUTATION,;  Surgeon: Newt Minion, MD;  Location: [place];  Service: Orthopedics;  Laterality: Right;   AMPUTATION Right 06/01/2021   Procedure: RIGHT BELOW KNEE AMPUTATION;  Surgeon: Newt Minion, MD;  Location: [place];  Service: Orthopedics;  Laterality: Right;   AMPUTATION Right 07/22/2021   Procedure: RIGHT ABOVE KNEE AMPUTATION;  Surgeon: Newt Minion, MD;  Location: [place];  Service: Orthopedics;  Laterality: Right;   APPENDECTOMY     BREAST BIOPSY Bilateral    9 total (04/18/2018)   CORONARY ANGIOPLASTY WITH STENT PLACEMENT  10/01/2015   normal LM, 30% LAD, 85% mid RCA and 95% distal RCA s/p PCI of the mid to distal RCA and now on DAPT with ASA and Ticagrelor.     CORONARY STENT INTERVENTION Right 04/18/2018   Procedure: CORONARY STENT INTERVENTION;  Surgeon: Burnell Blanks, MD;  Location: [place] [facility];  Service: Cardiovascular;  Laterality: Right;   DILATION AND CURETTAGE OF UTERUS     FRACTURE SURGERY     LAPAROSCOPIC CHOLECYSTECTOMY     LEFT HEART CATH AND CORONARY ANGIOGRAPHY N/A 04/18/2018   Procedure: LEFT HEART CATH AND CORONARY ANGIOGRAPHY;  Surgeon: Burnell Blanks, MD;  Location: [place] [facility];  Service: Cardiovascular;  Laterality: N/A;   MUSCLE BIOPSY Left    "leg"   OOPHORECTOMY  07/2010   RADIOLOGY WITH ANESTHESIA N/A 05/25/2016   Procedure: RADIOLOGY WITH ANESTHESIA;  Surgeon: Medication Radiologist, MD;  Location: [place];  Service: Radiology;  Laterality: N/A;   STUMP REVISION Right 06/29/2021   Procedure: REVISION RIGHT BELOW KNEE AMPUTATION;  Surgeon: Newt Minion, MD;  Location: [place];  Service: Orthopedics;  Laterality: Right;   TEE WITHOUT CARDIOVERSION N/A 06/01/2021   Procedure: TRANSESOPHAGEAL ECHOCARDIOGRAM (TEE);  Surgeon: Geralynn Rile, MD;  Location: [place];  Service: Cardiovascular;  Laterality: N/A;   TONSILLECTOMY AND ADENOIDECTOMY     WRIST FRACTURE SURGERY Left    "crushed it"    Past Surgical History:  Procedure Laterality Date   ABDOMINAL HYSTERECTOMY  06/2005   "w/right ovariy"   AMPUTATION Right 01/28/2020    RIGHT FOURTH AND FIFTH RAY AMPUTATION   AMPUTATION Right 01/28/2020   Procedure: RIGHT FOURTH AND FIFTH RAY AMPUTATION,;  Surgeon: Newt Minion, MD;  Location: [place];  Service: Orthopedics;  Laterality: Right;   AMPUTATION Right 06/01/2021   Procedure: RIGHT BELOW KNEE AMPUTATION;  Surgeon: Newt Minion, MD;  Location: [place];  Service: Orthopedics;  Laterality: Right;   AMPUTATION Right 07/22/2021   Procedure: RIGHT ABOVE KNEE AMPUTATION;  Surgeon: Newt Minion, MD;  Location: [place];  Service: Orthopedics;  Laterality: Right;   APPENDECTOMY     BREAST BIOPSY Bilateral    9 total (04/18/2018)   CORONARY ANGIOPLASTY WITH STENT PLACEMENT  10/01/2015   normal LM, 30% LAD, 85% mid RCA and 95% distal RCA s/p PCI of the mid to distal RCA and now on DAPT with ASA and Ticagrelor.     CORONARY STENT INTERVENTION Right 04/18/2018   Procedure: CORONARY STENT INTERVENTION;  Surgeon: Burnell Blanks, MD;  Location: [place] [facility];  Service: Cardiovascular;  Laterality: Right;   DILATION AND CURETTAGE OF UTERUS     FRACTURE SURGERY     LAPAROSCOPIC CHOLECYSTECTOMY     LEFT HEART CATH AND CORONARY ANGIOGRAPHY N/A 04/18/2018   Procedure: LEFT HEART CATH AND CORONARY ANGIOGRAPHY;  Surgeon: Burnell Blanks, MD;  Location: [place] [facility];  Service: Cardiovascular;  Laterality: N/A;   MUSCLE BIOPSY Left    "leg"   OOPHORECTOMY  07/2010   RADIOLOGY WITH ANESTHESIA N/A 05/25/2016   Procedure: RADIOLOGY WITH ANESTHESIA;  Surgeon: Medication Radiologist, MD;  Location: [place];  Service: Radiology;  Laterality: N/A;   STUMP REVISION Right 06/29/2021   Procedure: REVISION RIGHT BELOW KNEE AMPUTATION;  Surgeon: Newt Minion, MD;  Location: [place];  Service: Orthopedics;  Laterality: Right;   TEE WITHOUT CARDIOVERSION N/A 06/01/2021   Procedure:  TRANSESOPHAGEAL ECHOCARDIOGRAM (TEE);  Surgeon: Geralynn Rile, MD;  Location: [place];  Service: Cardiovascular;  Laterality: N/A;   TONSILLECTOMY AND ADENOIDECTOMY     WRIST FRACTURE SURGERY Left    "crushed it"   Past Medical History:  Diagnosis Date   Aortic stenosis    mild AS by echo 02/2021   Asthma    "on daily RX and rescue inhaler" (04/18/2018)   Chronic diastolic CHF (congestive heart failure) ([place]) 09/2015   Chronic lower back pain    Chronic neck pain    Chronic pain syndrome    Fentanyl Patch   Colon polyps    Coronary artery disease    cath with normal LM, 30% LAD, 85% mid RCA and 95% distal RCA s/p PCI of the mid to distal RCA and now on DAPT with ASA and Ticagrelor.     Eczema    Excessive daytime sleepiness 11/26/2015   Fibromyalgia    Gallstones    GERD (gastroesophageal reflux disease)    Heart murmur    "noted for the 1st time on 04/18/2018"   History of blood transfusion 07/2010   "S/P oophorectomy"   History of gout    History of hiatal hernia 1980s   "gone now" (04/18/2018)   Hyperlipidemia    Hypertension    takes Metoprolol and Enalapril daily   Hypothyroidism    takes Synthroid daily   IBS (irritable bowel syndrome)    Migraine    "nothing in the 2000s" (04/18/2018)   Mixed connective tissue disease ([place])    NAFLD (nonalcoholic fatty liver disease)    Pneumonia    "several times" (04/18/2018)   PVC's (premature ventricular contractions)    noted on event monitor 02/2021   Rheumatoid arthritis ([place])    "hands, elbows, shoulders, probably knees" (04/18/2018)   Scoliosis    Sleep apnea    mild - does not use cpap   Spondylosis    Type II diabetes mellitus ([place])    takes Metformin and Hum R daily (04/18/2018)   Walker as ambulation aid    also uses wheelchair   BP (!) 147/69 Comment: per patient  Pulse  83 Comment: per patient  Ht 5\' 2"  (1.575 m)   Wt 291 lb 9.6 oz (132.3 kg) Comment: per patient  SpO2 95% Comment: per patient  BMI 53.33 kg/m    Opioid Risk Score:   Fall Risk Score:  `1  Depression screen [facility] 2/9     01/31/2022   10:18 AM 12/20/2021   10:56 AM 10/24/2021   12:21 PM 09/27/2021    9:50 AM 08/25/2021    8:56 PM 08/09/2021    2:57 PM 07/15/2021    1:16 PM  Depression screen PHQ 2/9  Decreased Interest 0 3 1 0 0 0 0  Down, Depressed, Hopeless 0 3 1 0 1 1 0  PHQ - 2 Score 0 6 2 0 1 1 0  Altered sleeping    0   3  Tired, decreased energy       1  Change in appetite       1  Feeling bad or failure about yourself        3  Trouble concentrating       0  Moving slowly or fidgety/restless       0  Suicidal thoughts       0  PHQ-9 Score    0   8     Review of Systems  Constitutional: Negative.   HENT: Negative.    Eyes: Negative.   Respiratory: Negative.    Cardiovascular: Negative.   Gastrointestinal: Negative.   Endocrine: Negative.   Genitourinary: Negative.   Musculoskeletal:  Positive for arthralgias, back pain and gait problem.  Skin: Negative.   Allergic/Immunologic: Negative.   Hematological: Negative.   Psychiatric/Behavioral:  Positive for sleep disturbance.       Objective:   Physical Exam Vitals and nursing note reviewed.  Musculoskeletal:     Comments: No Physical Exam Performed: My-Chart Video Visit.         Assessment & Plan:  HX: of Right AKA: Dr Sharol Given Following. Continue to Monitor. 01/31/2022 Diabetic Polyneuropathy associated with Type 2 DM: Continue current medication regimen with Lyrica. Continue to Monitor. 01/31/2022 DM Type 2: PCP Following. Continue current medication regimen. Continue to Monitor. 01/31/2022 Chronic  Pain Syndrome : Refilled: Oxycodone 20 mg one tablet every 8 hours as needed for pain #90. We will continue the opioid monitoring program, this consists of regular clinic visits, examinations, urine drug screen, pill counts as well as use of [place] Controlled Substance Reporting system. A 12 month History has been reviewed on the [place]  Controlled Substance Reporting System on 01/31/2022 Stage 1 Decubitus: Left Buttock and Left Lower Extremity: Continue Adequate Nutrition and Weight Shifting.  She stated she is no longer going to Wound Care and she is taking care of her ulcer herself. She was instructed to F/U with her PCP.  Continue to Monitor. 01/31/2022  6. Cellulitis: Ms. Treasure was hospitalized on 04/07-2022-o4/18/2023, discharge summary was reviewed.   7. Phantom Pain: Continue Gabapentin  Continue to Monitor. 01/31/2022 8. Polyarthralgia: Continue HEP as Tolerated. Continue to Monitor. 01/31/2022   F/U in 1 Month   My-Chart Video Visit Established Patient Location of Patient: In her Home Location of Provider: In the Office

## 2022-02-06 ENCOUNTER — Other Ambulatory Visit: Payer: Self-pay | Admitting: Family Medicine

## 2022-02-07 ENCOUNTER — Telehealth: Payer: Self-pay | Admitting: Family Medicine

## 2022-02-07 NOTE — Telephone Encounter (Signed)
[name]) called stating pt had a fall on 5.27.23. Slow fall with sliding to the floor. No Injury.

## 2022-02-08 NOTE — Patient Instructions (Signed)
  It was a pleasure speaking with you  Below is a summary of your health goals and care plan   Patient Goals/[name] Activities take medications as prescribed,  check glucose 3 to 4 times per day, document, and provide at future appointments,  collaborate with provider on medication access solutions, and complete patient assistance applications and return to [facility] Primary Care (Akul Leggette, clinical pharmacist) Continue ezetimibe 10mg  daily   If you have any questions or concerns, please feel free to contact me either at the phone number below or with a MyChart message.   Keep up the good work!  Henrene Pastor, PharmD Clinical Pharmacist [facility] Primary Care SW [facility] [phone] (direct line)  [phone] (main office number)   Patient verbalizes understanding of instructions and care plan provided today and agrees to view in MyChart. Active MyChart status and patient understanding of how to access instructions and care plan via MyChart confirmed with patient.

## 2022-02-16 ENCOUNTER — Telehealth: Payer: Self-pay | Admitting: Family Medicine

## 2022-02-16 NOTE — Telephone Encounter (Signed)
Call[facility]/Agency[facility]  Callback Number: [phone] Requesting OT/PT/Skilled Nursing/Social Work/Speech Therapy: Nursing  Frequency: Re-certification

## 2022-02-16 NOTE — Telephone Encounter (Signed)
OK 

## 2022-02-17 NOTE — Telephone Encounter (Signed)
Called HH ok for request per PCP ok.

## 2022-02-24 ENCOUNTER — Telehealth: Payer: Self-pay | Admitting: Family Medicine

## 2022-02-24 NOTE — Telephone Encounter (Signed)
Spoke w/ Conception Oms, RN- verbal orders given for wound care.

## 2022-02-27 ENCOUNTER — Encounter: Payer: Self-pay | Admitting: Physical Medicine and Rehabilitation

## 2022-02-27 ENCOUNTER — Telehealth: Payer: Self-pay

## 2022-02-27 ENCOUNTER — Encounter: Payer: 59 | Attending: Registered Nurse | Admitting: Physical Medicine and Rehabilitation

## 2022-02-27 VITALS — BP 149/79 | HR 104 | Ht 62.0 in

## 2022-02-27 DIAGNOSIS — Z79899 Other long term (current) drug therapy: Secondary | ICD-10-CM | POA: Insufficient documentation

## 2022-02-27 DIAGNOSIS — M25579 Pain in unspecified ankle and joints of unspecified foot: Secondary | ICD-10-CM | POA: Diagnosis present

## 2022-02-27 DIAGNOSIS — G894 Chronic pain syndrome: Secondary | ICD-10-CM | POA: Diagnosis present

## 2022-02-27 DIAGNOSIS — M5432 Sciatica, left side: Secondary | ICD-10-CM | POA: Diagnosis present

## 2022-02-27 DIAGNOSIS — Z5181 Encounter for therapeutic drug level monitoring: Secondary | ICD-10-CM | POA: Insufficient documentation

## 2022-02-27 MED ORDER — LIDOCAINE 5 % EX PTCH
1.0000 | MEDICATED_PATCH | CUTANEOUS | 0 refills | Status: DC
Start: 2022-02-27 — End: 2022-04-19

## 2022-02-27 MED ORDER — PREGABALIN 50 MG PO CAPS: 50.0000 mg | ORAL_CAPSULE | Freq: Three times a day (TID) | ORAL | 3 refills | Status: DC

## 2022-02-27 MED ORDER — OXYCODONE HCL 20 MG PO TABS: 1.0000 | ORAL_TABLET | Freq: Three times a day (TID) | ORAL | 0 refills | Status: DC | PRN

## 2022-02-27 NOTE — Progress Notes (Signed)
Subjective:    Patient ID: [name], female    DOB: 1961-05-26, 61 y.o.   MRN: [id number]  HPI [name] is a 61 year old woman who presents for hospital follow-up after CIR admission for right AKA.   1) Right AKA -still draining a lot, clear fluid with a tinge of pink -Wound vac removed -on aspirin -seen by Dr. Audrie Lia team today. -doing ok with her wheelchair mobility -therapy has been going very well, she feels it is going very well.  -she asks about getting prosthesis  2) Residual limb pain -she has been using the tramadol and oxycodone.  -oxycodone is more effective for her.  -she takes oxycodone 20mg  every 8 hours approximately.  -she uses tylenol  3) Confusion: -husband notes that this is worse with the tramadol.   4) Sciatic pain: -pain has returned.   5) Right lower extremity phantom pain: -mild  6) Joint pain: -diffuse, present in elbows, shoulders. -worse with weather -has never tried Infrared light -Uses heating pads and ice -she uses voltaren gels  Pain Inventory Average Pain 7 Pain Right Now 7 My pain is constant, sharp, tingling, and aching  In the last 24 hours, has pain interfered with the following? General activity 8 Relation with others 6 Enjoyment of life 8 What TIME of day is your pain at its worst? morning , daytime, evening, and night Sleep (in general) Poor  Pain is worse with: bending, sitting, and some activites Pain improves with: rest, heat/ice, therapy/exercise, medication, injections, and VIDEO GAMES Relief from Meds: 7      Family History  Problem Relation Age of Onset   CAD Mother    Hypertension Mother    Heart attack Mother    CAD Father    Heart attack Father    Allergic rhinitis Father    Asthma Father    Hypertension Brother    Hypertension Brother    Pancreatic cancer Paternal Aunt    Breast cancer Paternal Aunt    Lung cancer Paternal Aunt    Asthma Son    Social History   Socioeconomic  History   Marital status: Married    Spouse name: Woehl   Number of children: 1   Years of education: 11   Highest education level: GED or equivalent  Occupational History    Comment: Not currrently working ( last worked 2004)  Tobacco Use   Smoking status: Former    Packs/day: 1.00    Years: 19.00    Total pack years: 19.00    Types: Cigarettes    Start date: 76    Quit date: 02/11/2004    Years since quitting: 18.0    Passive exposure: Past   Smokeless tobacco: Never  Vaping Use   Vaping Use: Never used  Substance and Sexual Activity   Alcohol use: Yes    Comment: RARE Wine   Drug use: Not Currently    Types: Marijuana    Comment: "only in my teens"   Sexual activity: Yes    Birth control/protection: Surgical    Comment: Hysterectomy  Other Topics Concern   Not on file  Social History Narrative   Not on file   Social Determinants of Health   Financial Resource Strain: Low Risk  (12/27/2021)   Overall Financial Resource Strain (CARDIA)    Difficulty of Paying Living Expenses: Not hard at all  Food Insecurity: No Food Insecurity (12/27/2021)   Hunger Vital Sign    Worried About  Running Out of Food in the Last Year: Never true    Ran Out of Food in the Last Year: Never true  Transportation Needs: No Transportation Needs (08/25/2021)   PRAPARE - Administrator, Civil Service (Medical): No    Lack of Transportation (Non-Medical): No  Recent Concern: Transportation Needs - Unmet Transportation Needs (08/09/2021)   PRAPARE - Transportation    Lack of Transportation (Medical): Yes    Lack of Transportation (Non-Medical): Yes  Physical Activity: Inactive (08/25/2021)   Exercise Vital Sign    Days of Exercise per Week: 0 days    Minutes of Exercise per Session: 0 min  Stress: Stress Concern Present (08/25/2021)   [employer] of Occupational Health - Occupational Stress Questionnaire    Feeling of Stress : Very much  Social Connections:  Moderately Integrated (08/25/2021)   Social Connection and Isolation Panel [NHANES]    Frequency of Communication with Friends and Family: More than three times a week    Frequency of Social Gatherings with Friends and Family: More than three times a week    Attends Religious Services: More than 4 times per year    Active Member of [employer] or Organizations: No    Attends Banker Meetings: Never    Marital Status: Married   Past Surgical History:  Procedure Laterality Date   ABDOMINAL HYSTERECTOMY  06/2005   "w/right ovariy"   AMPUTATION Right 01/28/2020    RIGHT FOURTH AND FIFTH RAY AMPUTATION   AMPUTATION Right 01/28/2020   Procedure: RIGHT FOURTH AND FIFTH RAY AMPUTATION,;  Surgeon: [name], MD;  Location: [facility] OR;  Service: Orthopedics;  Laterality: Right;   AMPUTATION Right 06/01/2021   Procedure: RIGHT BELOW KNEE AMPUTATION;  Surgeon: [name], MD;  Location: [facility] OR;  Service: Orthopedics;  Laterality: Right;   AMPUTATION Right 07/22/2021   Procedure: RIGHT ABOVE KNEE AMPUTATION;  Surgeon: [name], MD;  Location: [facility] OR;  Service: Orthopedics;  Laterality: Right;   APPENDECTOMY     BREAST BIOPSY Bilateral    9 total (04/18/2018)   CORONARY ANGIOPLASTY WITH STENT PLACEMENT  10/01/2015   normal LM, 30% LAD, 85% mid RCA and 95% distal RCA s/p PCI of the mid to distal RCA and now on DAPT with ASA and Ticagrelor.     CORONARY STENT INTERVENTION Right 04/18/2018   Procedure: CORONARY STENT INTERVENTION;  Surgeon: [name], MD;  Location: [facility];  Service: Cardiovascular;  Laterality: Right;   DILATION AND CURETTAGE OF UTERUS     FRACTURE SURGERY     LAPAROSCOPIC CHOLECYSTECTOMY     LEFT HEART CATH AND CORONARY ANGIOGRAPHY N/A 04/18/2018   Procedure: LEFT HEART CATH AND CORONARY ANGIOGRAPHY;  Surgeon: [name], MD;  Location: [facility];  Service: Cardiovascular;  Laterality: N/A;   MUSCLE BIOPSY Left    "leg"    OOPHORECTOMY  07/2010   RADIOLOGY WITH ANESTHESIA N/A 05/25/2016   Procedure: RADIOLOGY WITH ANESTHESIA;  Surgeon: Medication Radiologist, MD;  Location: [facility] OR;  Service: Radiology;  Laterality: N/A;   STUMP REVISION Right 06/29/2021   Procedure: REVISION RIGHT BELOW KNEE AMPUTATION;  Surgeon: [name], MD;  Location: [facility] OR;  Service: Orthopedics;  Laterality: Right;   TEE WITHOUT CARDIOVERSION N/A 06/01/2021   Procedure: TRANSESOPHAGEAL ECHOCARDIOGRAM (TEE);  Surgeon: Sande Rives, MD;  Location: [facility] OR;  Service: Cardiovascular;  Laterality: N/A;   TONSILLECTOMY AND ADENOIDECTOMY     WRIST FRACTURE  SURGERY Left    "crushed it"   Past Medical History:  Diagnosis Date   Aortic stenosis    mild AS by echo 02/2021   Asthma    "on daily RX and rescue inhaler" (04/18/2018)   Chronic diastolic CHF (congestive heart failure) (HCC) 09/2015   Chronic lower back pain    Chronic neck pain    Chronic pain syndrome    Fentanyl Patch   Colon polyps    Coronary artery disease    cath with normal LM, 30% LAD, 85% mid RCA and 95% distal RCA s/p PCI of the mid to distal RCA and now on DAPT with ASA and Ticagrelor.     Eczema    Excessive daytime sleepiness 11/26/2015   Fibromyalgia    Gallstones    GERD (gastroesophageal reflux disease)    Heart murmur    "noted for the 1st time on 04/18/2018"   History of blood transfusion 07/2010   "S/P oophorectomy"   History of gout    History of hiatal hernia 1980s   "gone now" (04/18/2018)   Hyperlipidemia    Hypertension    takes Metoprolol and Enalapril daily   Hypothyroidism    takes Synthroid daily   IBS (irritable bowel syndrome)    Migraine    "nothing in the 2000s" (04/18/2018)   Mixed connective tissue disease (HCC)    NAFLD (nonalcoholic fatty liver disease)    Pneumonia    "several times" (04/18/2018)   PVC's (premature ventricular contractions)    noted on event monitor 02/2021   Rheumatoid arthritis (HCC)    "hands, elbows,  shoulders, probably knees" (04/18/2018)   Scoliosis    Sleep apnea    mild - does not use cpap   Spondylosis    Type II diabetes mellitus (HCC)    takes Metformin and Hum R daily (04/18/2018)   Walker as ambulation aid    also uses wheelchair   Ht  (1.575 m)   BMI 53.33 kg/m   Opioid Risk Score:   Fall Risk Score:  `1  Depression screen [facility] 2/9     01/31/2022   10:18 AM 12/20/2021   10:56 AM 10/24/2021   12:21 PM 09/27/2021    9:50 AM 08/25/2021    8:56 PM 08/09/2021    2:57 PM 07/15/2021    1:16 PM  Depression screen PHQ 2/9  Decreased Interest 0 3 1 0 0 0 0  Down, Depressed, Hopeless 0 3 1 0 1 1 0  PHQ - 2 Score 0 6 2 0 1 1 0  Altered sleeping    0   3  Tired, decreased energy       1  Change in appetite       1  Feeling bad or failure about yourself        3  Trouble concentrating       0  Moving slowly or fidgety/restless       0  Suicidal thoughts       0  PHQ-9 Score    0   8    Review of Systems  Constitutional: Negative.   HENT: Negative.    Eyes: Negative.   Respiratory:  Positive for shortness of breath and wheezing.   Endocrine: Negative.   Genitourinary: Negative.  Negative for decreased urine volume.  Musculoskeletal:  Positive for back pain and gait problem.       TINGLING, SPASMS  Skin:  Positive for wound.  Allergic/Immunologic: Negative.  Neurological:  Positive for dizziness and tremors.  Hematological: Negative.   Psychiatric/Behavioral:  Positive for sleep disturbance.   All other systems reviewed and are negative.     Objective:   Physical Exam Gen: no distress, normal appearing, weight 291 lbs, 149/79 HEENT: oral mucosa pink and moist, NCAT Cardio: Reg rate Chest: normal effort, normal rate of breathing Abd: soft, non-distended Ext: no edema Psych: pleasant, normal affect Skin: intact Neuro: Alert and oriented x3 Musculoskeletal: Right AKA with shrinker    Assessment & Plan:  1) Diffuse joint pain: -Discussed current symptoms  of pain and history of pain.  -continue voltaren gel -refilled oxycodone 20mg  TID PRN for severe pain Urine swab today -Discussed benefits of exercise in reducing pain. -Discussed following foods that may reduce pain: 1) Ginger (especially studied for arthritis)- reduce leukotriene production to decrease inflammation 2) Blueberries- high in phytonutrients that decrease inflammation 3) Salmon- marine omega-3s reduce joint swelling and pain 4) Pumpkin seeds- reduce inflammation 5) dark chocolate- reduces inflammation 6) turmeric- reduces inflammation 7) tart cherries - reduce pain and stiffness 8) extra virgin olive oil - its compound olecanthal helps to block prostaglandins  9) chili peppers- can be eaten or applied topically via capsaicin 10) mint- helpful for headache, muscle aches, joint pain, and itching 11) garlic- reduces inflammation  Link to further information on diet for chronic pain: [email or web address]    2) Diabetes: -check CBGs daily, log, and bring log to follow-up appointment -avoid sugar, bread, pasta, rice -avoid snacking -perform daily foot exam and at least annual eye exam -try to incorporate into your diet some of the following foods which are good for diabetes: 1) cinnamon- imitates effects of insulin, increasing glucose transport into cells ([place] or [place] cinnamon is best, least processed) 2) nuts- can slow down the blood sugar response of carbohydrate rich foods 3) oatmeal- contains and anti-inflammatory compound avenanthramide 4) whole-milk yogurt (best types are no sugar, [place] yogurt, or goat/sheep yogurt) 5) beans- high in protein, fiber, and vitamins, low glycemic index 6) broccoli- great source of vitamin A and C 7) quinoa- higher in protein and fiber than other grains 8) spinach- high in vitamin A, fiber, and protein 9) olive oil- reduces glucose levels, LDL, and  triglycerides 10) salmon- excellent amount of omega-3-fatty acids 11) walnuts- rich in antioxidants 12) apples- high in fiber and quercetin 13) carrots- highly nutritious with low impact on blood sugar 14) eggs- improve HDL (good cholesterol), high in protein, keep you satiated 15) turmeric: improves blood sugars, cardiovascular disease, and protects kidney health 16) garlic: improves blood sugar, blood pressure, pain 17) tomatoes: highly nutritious with low impact on blood sugar   3) Sciatic pain: -prescribed lidocaine patch -increase lyrica to 50 mg BID -discussed steroid injections

## 2022-02-27 NOTE — Telephone Encounter (Signed)
Attempted to do the PA for Lidocaine patches through CoverMyMeds.   [employer] Rx Prior Authorization Department is unable to review this request at this time. Our records indicate that this member is not eligible for prior authorizations or that the review of prior authorizations is not delegated to [employer] Rx Prior Authorization Department. Please review the member's insurance demographics and submit to the appropriate party. Thank you in advance.

## 2022-02-28 ENCOUNTER — Other Ambulatory Visit: Payer: Self-pay | Admitting: Family Medicine

## 2022-03-03 LAB — DRUG TOX MONITOR 1 W/CONF, ORAL FLD
Amphetamines: NEGATIVE ng/mL (ref <10)
Barbiturates: NEGATIVE ng/mL (ref <10)
Benzodiazepines: NEGATIVE ng/mL (ref <0.50)
Buprenorphine: NEGATIVE ng/mL (ref <0.10)
Cocaine: NEGATIVE ng/mL (ref <5.0)
Codeine: NEGATIVE ng/mL (ref <2.5)
Dihydrocodeine: NEGATIVE ng/mL (ref <2.5)
Fentanyl: NEGATIVE ng/mL (ref <0.10)
Heroin Metabolite: NEGATIVE ng/mL (ref <1.0)
Hydrocodone: NEGATIVE ng/mL (ref <2.5)
Hydromorphone: NEGATIVE ng/mL (ref <2.5)
MARIJUANA: NEGATIVE ng/mL (ref <2.5)
MDMA: NEGATIVE ng/mL (ref <10)
Meprobamate: NEGATIVE ng/mL (ref <2.5)
Methadone: NEGATIVE ng/mL (ref <5.0)
Morphine: NEGATIVE ng/mL (ref <2.5)
Nicotine Metabolite: NEGATIVE ng/mL (ref <5.0)
Norhydrocodone: NEGATIVE ng/mL (ref <2.5)
Noroxycodone: 16.1 ng/mL — ABNORMAL HIGH (ref <2.5)
Opiates: POSITIVE ng/mL — AB (ref <2.5)
Oxycodone: 232.5 ng/mL — ABNORMAL HIGH (ref <2.5)
Oxymorphone: NEGATIVE ng/mL (ref <2.5)
Phencyclidine: NEGATIVE ng/mL (ref <10)
Tapentadol: NEGATIVE ng/mL (ref <5.0)
Tramadol: NEGATIVE ng/mL (ref <5.0)
Zolpidem: NEGATIVE ng/mL (ref <5.0)

## 2022-03-03 LAB — DRUG TOX ALC METAB W/CON, ORAL FLD: Alcohol Metabolite: NEGATIVE ng/mL (ref <25)

## 2022-03-07 ENCOUNTER — Telehealth: Payer: Self-pay | Admitting: *Deleted

## 2022-03-10 ENCOUNTER — Other Ambulatory Visit: Payer: Self-pay | Admitting: Family Medicine

## 2022-03-10 DIAGNOSIS — J45909 Unspecified asthma, uncomplicated: Secondary | ICD-10-CM

## 2022-03-13 ENCOUNTER — Other Ambulatory Visit: Payer: Self-pay | Admitting: Family Medicine

## 2022-03-16 ENCOUNTER — Ambulatory Visit: Payer: 59

## 2022-03-16 NOTE — Patient Instructions (Signed)
Visit Information  Thank you for allowing me to share the care management and care coordination services that are available to you as part of your health plan and services through your primary care provider and medical home. Please reach out to your Primary Care Provider or me at [phone] if the care management/care coordination team may be of assistance to you in the future.   Kejon Feild, RN, MSN, BSN, CCM Care Management Coordinator [facility] [phone]  

## 2022-03-16 NOTE — Chronic Care Management (AMB) (Signed)
Care Management    RN Visit Note  03/16/2022 Name: [name] MRN: [id number] DOB: 1961/02/23  Subjective: [name] is a 62 y.o. year old female who is a primary care patient of Shelda Pal, DO. The care management team was consulted for assistance with disease management and care coordination needs.    Engaged with patient by telephone for follow up visit in response to provider referral for case management and/or care coordination services.   Consent to Services:   [name] was given information about Care Management services today including:  Care Management services includes personalized support from designated clinical staff supervised by her physician, including individualized plan of care and coordination with other care providers 24/7 contact phone numbers for assistance for urgent and routine care needs. The patient may stop case management services at any time by phone call to the office staff.  Patient agreed to services and consent obtained.   Assessment: Review of patient past medical history, allergies, medications, health status, including review of consultants reports, laboratory and other test data, was performed as part of comprehensive evaluation and provision of chronic care management services.   SDOH (Social Determinants of Health) assessments and interventions performed:    Care Plan  Allergies  Allergen Reactions   Fish Allergy Anaphylaxis    INCLUDES OMEGA 3 OILS   Fish Oil Anaphylaxis and Swelling    THROAT SWELLS INCLUDES FISH AS A CLASS   Omega-3 Fatty Acids Swelling    Throat swelling  Has tolerated Glucerna nutritional drinks    Crestor [Rosuvastatin] Other (See Comments)    Extreme joint pain, to this & other statins   Gabapentin Anxiety    Anxiety on high doses   Lovastatin Other (See Comments)    EXTREME JOINT PAIN   Metronidazole Nausea Only and Swelling    Headache and shakes Nausea and vomiting   Red Yeast Rice  [Cholestin] Other (See Comments)    Muscle pain and severe joint pain.    Rotigotine Swelling    (Neupro) Extreme edema    Atrovent Hfa [Ipratropium Bromide Hfa] Other (See Comments)    wheezing   Iodine Swelling   Other Other (See Comments)    Surgical Staples causes redness/infection per patient.   Pepto-Bismol [Bismuth Subsalicylate] Nausea And Vomiting    Extreme vomiting   Victoza [Liraglutide]     Unsure of reaction    Enalapril Cough   Suprep [Na Sulfate-K Sulfate-Mg Sulf] Nausea And Vomiting   Tape Other (See Comments)    Electrodes causes skin breakdown    Outpatient Encounter Medications as of 03/16/2022  Medication Sig Note   acetaminophen (TYLENOL) 500 MG tablet Take 500 mg by mouth every 8 (eight) hours as needed.    albuterol (PROAIR HFA) 108 (90 Base) MCG/ACT inhaler Inhale 2 puffs into the lungs every 4 (four) hours as needed for wheezing or shortness of breath.    albuterol (PROVENTIL) (2.5 MG/3ML) 0.083% nebulizer solution USE 1 VIAL IN NEBULIZER EVERY 4 HOURS AS NEEDED FOR WHEEZING FOR SHORTNESS OF BREATH    ascorbic acid (VITAMIN C) 500 MG tablet Take 1 tablet (500 mg total) by mouth 2 (two) times daily.    aspirin EC 81 MG tablet Take 1 tablet (81 mg total) by mouth every evening.    augmented betamethasone dipropionate (DIPROLENE AF) 0.05 % cream Apply topically 2 (two) times daily.    cyclobenzaprine (FLEXERIL) 10 MG tablet Take 10 mg by mouth 3 (three) times daily  as needed for muscle spasms.    dapagliflozin propanediol (FARXIGA) 10 MG TABS tablet Take 1 tablet (10 mg total) by mouth daily before breakfast.    diclofenac Sodium (VOLTAREN) 1 % GEL Apply 2 g topically 4 (four) times daily.    DILT-XR 240 MG 24 hr capsule Take 240 mg by mouth daily.    EPINEPHrine (EPIPEN 2-PAK) 0.3 mg/0.3 mL IJ SOAJ injection USE AS DIRECTED FOR SEVERE ALLERGIC REACTION.    ezetimibe (ZETIA) 10 MG tablet Take 1 tablet (10 mg total) by mouth daily.    fluconazole (DIFLUCAN) 150  MG tablet Take 1 tab, repeat in 72 hours if no improvement.    fluticasone (FLONASE) 50 MCG/ACT nasal spray USE 2 SPRAY(S) IN EACH NOSTRIL ONCE DAILY AS NEEDED FOR ALLERGIES OR  RHINITIS    furosemide (LASIX) 20 MG tablet Take 2 tablets (40 mg total) by mouth daily.    gabapentin (NEURONTIN) 400 MG capsule TAKE 1 CAPSULE BY MOUTH IN THE MORNING AND 1 AT NOON AND 2 AT BEDTIME    GLUCOMANNAN PO Take 1,995 mg by mouth in the morning, at noon, and at bedtime.    glucose blood test strip 1 each by Other route 4 (four) times daily. Contour Next strips    guaiFENesin (MUCINEX) 600 MG 12 hr tablet Take by mouth 2 (two) times daily as needed.    hydrocortisone cream 1 % Apply 1 application topically 3 (three) times daily as needed for itching (minor skin irritation).    hydroxychloroquine (PLAQUENIL) 200 MG tablet Take 1 tablet (200 mg total) by mouth 2 (two) times daily.    hydrOXYzine (ATARAX) 10 MG tablet Take 1 tablet (10 mg total) by mouth 3 (three) times daily as needed.    insulin regular human CONCENTRATED (HUMULIN R) 500 UNIT/ML injection INJECT 0.08-0.3 MLS (40-150 UNITS TOTAL) INTO THE SKIN THREE TIMES DAILY WITH MEALS    INSULIN SYRINGE 1CC/29G (B-D INSULIN SYRINGE) 29G X 1/2" 1 ML MISC Use three times daily with insulin.  DX E11.9    isosorbide mononitrate (IMDUR) 30 MG 24 hr tablet Take 1 tablet (30 mg total) by mouth daily.    levocetirizine (XYZAL) 5 MG tablet TAKE 1 TABLET BY MOUTH ONCE DAILY IN THE EVENING    levothyroxine (SYNTHROID) 200 MCG tablet TAKE 1 TABLET BY MOUTH ONCE DAILY BEFORE BREAKFAST    lidocaine (LIDODERM) 5 % Place 1 patch onto the skin daily. Remove & Discard patch within 12 hours or as directed by MD    magnesium oxide (MAG-OX) 400 MG tablet Take 400 mg by mouth daily as needed (cramps).    metFORMIN (GLUCOPHAGE-XR) 500 MG 24 hr tablet Take 2 tablets by mouth twice daily    methocarbamol (ROBAXIN) 750 MG tablet Take 1 tablet (750 mg total) by mouth every 8 (eight) hours  as needed for muscle spasms.    mometasone-formoterol (DULERA) 100-5 MCG/ACT AERO Inhale 2 puffs into the lungs 2 (two) times daily. 01/30/2022: Plans to change to generic Adviar but has 1 inhaler from hospitalzation and will use Dulera first.    Multiple Vitamins-Minerals (WOMENS 50+ MULTI VITAMIN PO) Take 1 tablet by mouth daily.    mupirocin ointment (BACTROBAN) 2 % Apply 1 application topically 2 (two) times daily as needed (wound care).    naloxone (NARCAN) nasal spray 4 mg/0.1 mL Place 1 spray into the nose as needed (opoid overdose).    nitroGLYCERIN (NITROSTAT) 0.4 MG SL tablet DISSOLVE ONE TABLET UNDER THE TONGUE EVERY 5 MINUTES  AS NEEDED FOR CHEST PAIN.  DO NOT EXCEED A TOTAL OF 3 DOSES IN 15 MINUTES    omeprazole (PRILOSEC) 40 MG capsule Take 1 capsule by mouth once daily    Oxycodone HCl 20 MG TABS Take 1 tablet (20 mg total) by mouth every 8 (eight) hours as needed.    oxymetazoline (AFRIN) 0.05 % nasal spray Place 2 sprays into both nostrils 2 (two) times daily as needed.    Polyethyl Glycol-Propyl Glycol (SYSTANE) 0.4-0.3 % SOLN Place 1-2 drops into both eyes in the morning.    pramipexole (MIRAPEX) 1 MG tablet TAKE 1 TABLET BY MOUTH THREE TIMES DAILY AND 1/2 (ONE-HALF) TWICE DAILY AS NEEDED FOR  BREAKTHROUGH  RESTLESS  LEG    pregabalin (LYRICA) 50 MG capsule Take 1 capsule (50 mg total) by mouth 3 (three) times daily.    promethazine (PHENERGAN) 25 MG tablet Take 1 tablet (25 mg total) by mouth every 8 (eight) hours as needed for vomiting or nausea.    saccharomyces boulardii (FLORASTOR) 250 MG capsule Take 250 mg by mouth 3 (three) times daily. 01/30/2022: Only takes when she takes antibiotic   Vitamin D, Ergocalciferol, (DRISDOL) 1.25 MG (50000 UNIT) CAPS capsule TAKE 1 CAPSULE BY MOUTH ONCE A WEEK -TAKE  ON  FRIDAY    ZINC OXIDE, TOPICAL, 10 % CREA Apply topically.    ZINC SULFATE PO Take 255 mg by mouth daily.    No facility-administered encounter medications on file as of  03/16/2022.    Patient Active Problem List   Diagnosis Date Noted   Sacral decubitus ulcer, stage II ([place]) 12/24/2021   Class 3 obesity ([place]) 12/23/2021   Iron deficiency anemia 12/23/2021   Acute on chronic diastolic CHF (congestive heart failure) ([place]) 12/22/2021   Hyperkalemia 12/22/2021   Left leg cellulitis 12/20/2021   Peripheral vascular disease, unspecified ([place]) 10/10/2021   Unilateral AKA, right ([place]) 10/10/2021   Hx of AKA (above knee amputation), right ([place]) 07/22/2021   Dehiscence of amputation stump (HCC)    Acute blood loss anemia 06/24/2021   Postoperative wound infection 06/24/2021   UTI (urinary tract infection) 06/24/2021   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 ([place])    Bacteremia due to Enterococcus 05/28/2021   Sepsis ([place]) 05/26/2021   Digestive disorder 05/18/2021   Bilateral lower extremity edema 05/02/2021   Statin myopathy 03/04/2021   Aortic stenosis 03/04/2021   Sjogren's syndrome ([place]) 02/11/2020   Cutaneous abscess of right foot    Statin intolerance 08/16/2019   Gram-negative bacteremia 10/18/2018   Streptococcal bacteremia 10/18/2018   CAD S/P percutaneous coronary angioplasty 04/19/2018   Essential hypertension    Moderate persistent asthma 03/21/2018   NAFLD (nonalcoholic fatty liver disease)    Recurrent cellulitis of lower extremity 01/02/2018   Cellulitis of lower leg 01/02/2018   Chronic diarrhea 05/08/2017   H/O Clostridium difficile infection 05/08/2017   Rectal bleeding 05/08/2017   Hyperbilirubinemia 04/29/2016   Hyponatremia 04/29/2016   Cellulitis of right foot 03/09/2016   Allergic rhinitis due to pollen 02/15/2016   Anaphylactic reaction due to food 02/10/2016   Type 2 diabetes mellitus with hyperglycemia, with long-term current use of insulin ([place]) 02/10/2016   Gastroesophageal reflux disease without esophagitis 02/10/2016   Atopic eczema 02/10/2016   Cervical nerve root disorder 12/15/2015    Lumbar radiculopathy 12/15/2015   Daytime somnolence 11/26/2015   Venous stasis dermatitis of both lower extremities 02/11/2015   Morbid obesity ([place]) 06/19/2014   Abnormal LFTs 03/26/2014  B-complex deficiency 03/26/2014   Benign essential HTN 03/26/2014   Chronic pain associated with significant psychosocial dysfunction 03/26/2014   Diaphragmatic hernia 03/26/2014   Gastroesophageal reflux disease 03/26/2014   Hammer toe 03/26/2014   H/O neoplasm 03/26/2014   Adaptive colitis 03/26/2014   Deafness, sensorineural 03/26/2014   Fibromyalgia 01/20/2014   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/20/2014   Hyperlipidemia 11/11/2012   Mixed connective tissue disease ([place]) 11/11/2012   Hypothyroidism 15/40/0867   Uncomplicated asthma 61/95/0932   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[place]) 02/20/2012   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 Completed 03/16/2022  Start Date: 08/09/2021  Expected End Date: 05/24/2022  Priority: High  Note:   Current Barriers:  03/16/22 RNCM spoke with [name] who reports, "I think I am doing pretty well". She reports she is active with home health for physical therapy and nursing. She reports her wound "looks good today"- home health nurse visits twice a week. She reports she has all her medications. She denies any questions or concerns. She is in good spirits. RNCM discussed case closure. [name] is in agreement. RNCM encouraged her  to contact primary care provider if any care management or care coordination needs in the future.  Frequent admission: 05/26/21-06/08/21 admitted with Bacteremia, R BKA on 06/01/21; 06/08/21-06/24/21 Rehab; 06/29/21-07/01/21 dehiscence of amputation stump/Revision of R BKA on 06/29/21. ; 07/22/20-07/29/20 R AKA on 07/22/21 Knowledge Deficits related to plan of care for management of transition of care post right AKA  Care Coordination needs related to Limited social support   RNCM Clinical Goal(s):  Patient will verbalize understanding of plan for management of Right AKA as evidenced by self report take all medications exactly as prescribed and will call provider for medication related questions as evidenced by self report and/or chart notation    work with Education officer, museum to address feelings related to recent AKA related to the management of her health as evidenced by review of EMR and patient or Education officer, museum report     work with Data processing manager care guide to address needs related to Transportation and would like [employer] information  as evidenced by patient and/or community resource care guide support    through collaboration with Consulting civil engineer, provider, and care team.   Interventions: 1:1 collaboration with primary care provider regarding development and update of comprehensive plan of care as evidenced by provider attestation and co-signature Inter-disciplinary care team collaboration (see longitudinal plan of care) Evaluation of current treatment plan related to  self management and patient's adherence to plan as established by provider  Heart Failure Interventions:  (Status:  Goal Met.) Long Term Goal Encouraged to continue to attend provider visits as scheduled Encouraged to continue to take medications as prescribed  Alteration in skin integrity interventions:  (Status:  Goal Met.)  Long Term Goal Encouraged to   Encouraged to continue to attend provider visit as  scheduled. Encouraged to contact provider as needed Advised patient to participate in home health  therapy as recommended and contact home health nurse as needed    Asthma: (Status:Goal Met.) Long Term Goal Reports does not need to use rescue inhaler at much as she used to Encouraged to continue to track and avoid Asthma triggers.   Encouraged to continue to attend provider visits as scheduled Continue to take medications as prescribed.  Specialty Provider needs Interventions:  (Status:  Goal Met.)  Long Term Goal Rheumatologist Dr. Luanna Salk scheduled for 04/07/22 Reviewed providers patient states she needs to schedule, but states will schedule at her leisure: Neurologist, Gastroenterologist  Advised patient to contact primary care provider for questions/concerns or any worsening issues Encouraged to take medications as prescribed.  Patient Goals/[name] Activities: Take medications as prescribed   Attend all scheduled provider appointments Call provider office for new concerns or questions  use salt in moderation follow rescue plan if symptoms flare-up track symptoms and what helps feel better or worse dress right for the weather, hot or cold Continue to participate in therapy sessions as recommended   Plan: No further follow up required: Patient to contact Primary care provider if care management and/or care coordination needs in the future.  Thea Silversmith, RN, MSN, BSN, CCM Care Management Coordinator [facility] [phone]

## 2022-03-17 ENCOUNTER — Encounter: Payer: Self-pay | Admitting: Family Medicine

## 2022-03-17 ENCOUNTER — Telehealth (INDEPENDENT_AMBULATORY_CARE_PROVIDER_SITE_OTHER): Payer: 59 | Admitting: Family Medicine

## 2022-03-17 DIAGNOSIS — R238 Other skin changes: Secondary | ICD-10-CM

## 2022-03-17 MED ORDER — TRIAMCINOLONE ACETONIDE 0.1 % EX CREA: 1.0000 | TOPICAL_CREAM | Freq: Three times a day (TID) | CUTANEOUS | 0 refills | Status: DC

## 2022-03-17 MED ORDER — NYSTATIN 100000 UNIT/GM EX POWD: 1.0000 | Freq: Three times a day (TID) | CUTANEOUS | 0 refills | Status: DC

## 2022-03-17 NOTE — Progress Notes (Signed)
Chief Complaint  Patient presents with   Leg Pain    Left lower leg weeping    [name] is a 61 y.o. female here for a skin complaint. Due to COVID-19 pandemic, we are interacting via web portal for an electronic face-to-face visit. I verified patient's ID using 2 identifiers. Patient agreed to proceed with visit via this method. Patient is at home, I am at office. Patient and I are present for visit.   Duration: several weeks Location: RLE Pruritic? Yes Painful? No Drainage? Yes; improving; not pus Has been zinc wraps.  Other associated symptoms: no fevers or redness Therapies tried thus far: none  Past Medical History:  Diagnosis Date   Aortic stenosis    mild AS by echo 02/2021   Asthma    "on daily RX and rescue inhaler" (04/18/2018)   Chronic diastolic CHF (congestive heart failure) (HCC) 09/2015   Chronic lower back pain    Chronic neck pain    Chronic pain syndrome    Fentanyl Patch   Colon polyps    Coronary artery disease    cath with normal LM, 30% LAD, 85% mid RCA and 95% distal RCA s/p PCI of the mid to distal RCA and now on DAPT with ASA and Ticagrelor.     Eczema    Excessive daytime sleepiness 11/26/2015   Fibromyalgia    Gallstones    GERD (gastroesophageal reflux disease)    Heart murmur    "noted for the 1st time on 04/18/2018"   History of blood transfusion 07/2010   "S/P oophorectomy"   History of gout    History of hiatal hernia 1980s   "gone now" (04/18/2018)   Hyperlipidemia    Hypertension    takes Metoprolol and Enalapril daily   Hypothyroidism    takes Synthroid daily   IBS (irritable bowel syndrome)    Migraine    "nothing in the 2000s" (04/18/2018)   Mixed connective tissue disease (HCC)    NAFLD (nonalcoholic fatty liver disease)    Pneumonia    "several times" (04/18/2018)   PVC's (premature ventricular contractions)    noted on event monitor 02/2021   Rheumatoid arthritis (HCC)    "hands, elbows, shoulders, probably knees" (04/18/2018)    Scoliosis    Sleep apnea    mild - does not use cpap   Spondylosis    Type II diabetes mellitus (HCC)    takes Metformin and Hum R daily (04/18/2018)   Walker as ambulation aid    also uses wheelchair   Objective. No conversational dyspnea Age appropriate judgment and insight Skin is pinkish without erythema or drainage Nml affect and mood  Skin irritation - Plan: triamcinolone cream (KENALOG) 0.1 %, nystatin (MYCOSTATIN/NYSTOP) powder  Orders as above. Cont to keep c/d. No signs of advanced infection or poor healing.  F/u in 1 mo for DM visit.  The patient voiced understanding and agreement to the plan.  [name], DO 03/17/22 12:12 PM

## 2022-03-24 NOTE — Progress Notes (Signed)
Office Visit Note  Patient: [name]             Date of Birth: 1960/09/19           MRN: [id number]             PCP: Shelda Pal, DO Referring: Shelda Pal* Visit Date: 04/07/2022 Occupation: @GUAROCC @  Subjective:  Medication management  History of Present Illness: [name] is a 61 y.o. female seen in consultation per request of her PCP.  According to the patient at [facility] she started experiencing increased fatigue and muscle pain.  The symptoms gradually got worse to the point in 2002 she had to quit working.  She was suffering from insomnia fatigue and pain.  She states her symptoms got worse over the years.  She was given the diagnosis of fibromyalgia.  She did not have any insurance and could not see a doctor for a while.  In 2011 she established with a neurologist and Dr. Franki Monte (rheumatologist) he diagnosed her with mixed connective tissue disease and started her on hydroxychloroquine.  Patient is uncertain if it made a difference in her symptoms.  She states her symptoms were mainly generalized pain and fatigue.  She has had dry mouth and dry eyes over the years.  She is also lost several teeth.  She has some shortness of breath which she relates to obesity.  She has not seen a pulmonologist in the past.  She has had chest x-rays which were normal.  She goes to a cardiologist on a regular basis.  She gives history of fatigue, dry mouth and dry eyes.  She gets frequent fever blisters in her mouth.  She has chronic diarrhea due to IBS.  She has not noticed joint swelling but they appears puffy.  She has joint pain almost in all of her joints.  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  She has been going to pain management.  She has noticed some discoloration in her hands but not typical Raynaud's phenomenon per patient.  She denies any history of lymphadenopathy or photosensitivity.  She states that she had  at least 12 hospitalizations for sepsis.  She has frequent cellulitis.  She is currently dealing with cellulitis in her left lower extremity.  She also had a sacral decubitus ulcer.  She has rash on her upper extremities which she states is due to itching and is scratching.  There is no family history of autoimmune disease.  She is gravida 3, para 1, miscarriages 2.  She states she had preeclampsia.  There is no history of DVTs.  Activities of Daily Living:  Patient reports morning stiffness for all day. Patient Reports nocturnal pain.  Difficulty dressing/grooming: Reports Difficulty climbing stairs: Reports Difficulty getting out of chair: Denies Difficulty using hands for taps, buttons, cutlery, and/or writing: Reports  Review of Systems  Constitutional:  Positive for fatigue.  HENT:  Positive for mouth sores and mouth dryness.   Eyes:  Positive for dryness.  Respiratory:  Positive for shortness of breath.   Cardiovascular:  Negative for chest pain and palpitations.  Gastrointestinal:  Positive for diarrhea. Negative for blood in stool and constipation.  Endocrine: Negative for increased urination.  Genitourinary:  Positive for involuntary urination.  Musculoskeletal:  Positive for joint pain, joint pain, joint swelling, myalgias, muscle weakness, morning stiffness, muscle tenderness and myalgias.  Skin:  Positive for color change, rash, hair loss and  sensitivity to sunlight.  Allergic/Immunologic: Positive for susceptible to infections.  Neurological:  Positive for dizziness and headaches.  Hematological:  Negative for swollen glands.  Psychiatric/Behavioral:  Positive for depressed mood and sleep disturbance. The patient is not nervous/anxious.     PMFS History:  Patient Active Problem List   Diagnosis Date Noted   Sacral decubitus ulcer, stage II ([place]) 12/24/2021   Class 3 obesity ([place]) 12/23/2021   Iron deficiency anemia 12/23/2021   Acute on chronic diastolic CHF (congestive  heart failure) ([place]) 12/22/2021   Hyperkalemia 12/22/2021   Left leg cellulitis 12/20/2021   Peripheral vascular disease, unspecified ([place]) 10/10/2021   Unilateral AKA, right ([place]) 10/10/2021   Hx of AKA (above knee amputation), right ([place]) 07/22/2021   Dehiscence of amputation stump (HCC)    Acute blood loss anemia 06/24/2021   Postoperative wound infection 06/24/2021   UTI (urinary tract infection) 06/24/2021   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 ([place])    Bacteremia due to Enterococcus 05/28/2021   Sepsis ([place]) 05/26/2021   Digestive disorder 05/18/2021   Bilateral lower extremity edema 05/02/2021   Statin myopathy 03/04/2021   Aortic stenosis 03/04/2021   Sjogren's syndrome ([place]) 02/11/2020   Cutaneous abscess of right foot    Statin intolerance 08/16/2019   Gram-negative bacteremia 10/18/2018   Streptococcal bacteremia 10/18/2018   CAD S/P percutaneous coronary angioplasty 04/19/2018   Essential hypertension    Moderate persistent asthma 03/21/2018   NAFLD (nonalcoholic fatty liver disease)    Recurrent cellulitis of lower extremity 01/02/2018   Cellulitis of lower leg 01/02/2018   Chronic diarrhea 05/08/2017   H/O Clostridium difficile infection 05/08/2017   Rectal bleeding 05/08/2017   Hyperbilirubinemia 04/29/2016   Hyponatremia 04/29/2016   Cellulitis of right foot 03/09/2016   Allergic rhinitis due to pollen 02/15/2016   Anaphylactic reaction due to food 02/10/2016   Type 2 diabetes mellitus with hyperglycemia, with long-term current use of insulin ([place]) 02/10/2016   Gastroesophageal reflux disease without esophagitis 02/10/2016   Atopic eczema 02/10/2016   Cervical nerve root disorder 12/15/2015   Lumbar radiculopathy 12/15/2015   Daytime somnolence 11/26/2015   Venous stasis dermatitis of both lower extremities 02/11/2015   Morbid obesity ([place]) 06/19/2014   Abnormal LFTs 03/26/2014   B-complex deficiency 03/26/2014   Benign  essential HTN 03/26/2014   Chronic pain associated with significant psychosocial dysfunction 03/26/2014   Diaphragmatic hernia 03/26/2014   Gastroesophageal reflux disease 03/26/2014   Hammer toe 03/26/2014   H/O neoplasm 03/26/2014   Adaptive colitis 03/26/2014   Deafness, sensorineural 03/26/2014   Fibromyalgia 01/20/2014   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/20/2014   Hyperlipidemia 11/11/2012   Mixed connective tissue disease ([place]) 11/11/2012   Hypothyroidism 99991111   Uncomplicated asthma 99991111   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[place]) 02/20/2012   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     Eczema    Excessive daytime sleepiness 11/26/2015   Fibromyalgia    Gallstones    GERD (gastroesophageal reflux disease)    Heart murmur    "noted for the 1st time on 04/18/2018"   History of blood transfusion 07/2010   "S/P oophorectomy"   History of gout    History of hiatal hernia 1980s   "gone now" (04/18/2018)   Hyperlipidemia    Hypertension    takes Metoprolol and Enalapril daily   Hypothyroidism    takes Synthroid daily   IBS (irritable bowel syndrome)    Migraine    "nothing in the 2000s" (04/18/2018)   Mixed connective tissue disease (HCC)    NAFLD (nonalcoholic fatty liver disease)    Pneumonia    "several times" (04/18/2018)   PVC's (premature ventricular contractions)    noted on event monitor  02/2021   Rheumatoid arthritis (HCC)    "hands, elbows, shoulders, probably knees" (04/18/2018)   Scoliosis    Sjogren's syndrome (HCC)    Sleep apnea    mild - does not use cpap   Spondylosis    Type II diabetes mellitus (HCC)    takes Metformin and Hum R daily (04/18/2018)   Walker as ambulation aid    also uses wheelchair    Family History  Problem Relation Age of Onset   CAD Mother    Hypertension Mother    Heart attack Mother    Stroke Mother    CAD Father    Heart attack Father    Allergic rhinitis Father    Asthma Father    Hypertension Brother    Hypertension Brother    Pancreatic cancer Paternal Aunt    Breast cancer Paternal Aunt    Lung cancer Paternal Aunt    Allergic rhinitis Son    Asthma Son    Past Surgical History:  Procedure Laterality Date   ABDOMINAL HYSTERECTOMY  06/2005   "w/right ovariy"   AMPUTATION Right 01/28/2020    RIGHT FOURTH AND FIFTH RAY AMPUTATION   AMPUTATION Right 01/28/2020   Procedure: RIGHT FOURTH AND FIFTH RAY AMPUTATION,;  Surgeon: [name], MD;  Location: [facility] OR;  Service: Orthopedics;  Laterality: Right;   AMPUTATION Right 06/01/2021   Procedure: RIGHT BELOW KNEE AMPUTATION;  Surgeon: [name], MD;  Location: [facility] OR;  Service: Orthopedics;  Laterality: Right;   AMPUTATION Right 07/22/2021   Procedure: RIGHT ABOVE KNEE AMPUTATION;  Surgeon: [name], MD;  Location: [facility] OR;  Service: Orthopedics;  Laterality: Right;   APPENDECTOMY     BREAST BIOPSY Bilateral    9 total (04/18/2018)   CORONARY ANGIOPLASTY WITH STENT PLACEMENT  10/01/2015   normal LM, 30% LAD, 85% mid RCA and 95% distal RCA s/p PCI of the mid to distal RCA and now on DAPT with ASA and Ticagrelor.     CORONARY STENT INTERVENTION Right 04/18/2018   Procedure: CORONARY STENT INTERVENTION;  Surgeon: [name], MD;  Location: [facility] INVASIVE CV LAB;  Service: Cardiovascular;  Laterality: Right;   DILATION AND CURETTAGE OF UTERUS     FRACTURE SURGERY      LAPAROSCOPIC CHOLECYSTECTOMY     LEFT HEART CATH AND CORONARY ANGIOGRAPHY N/A 04/18/2018   Procedure: LEFT HEART CATH AND CORONARY ANGIOGRAPHY;  Surgeon: [name], MD;  Location: [facility] IN[facility];  Service: Cardiovascular;  Laterality: N/A;   MUSCLE BIOPSY Left    "  leg"   OOPHORECTOMY  07/2010   RADIOLOGY WITH ANESTHESIA N/A 05/25/2016   Procedure: RADIOLOGY WITH ANESTHESIA;  Surgeon: Medication Radiologist, MD;  Location: [facility] OR;  Service: Radiology;  Laterality: N/A;   STUMP REVISION Right 06/29/2021   Procedure: REVISION RIGHT BELOW KNEE AMPUTATION;  Surgeon: [name], MD;  Location: [facility] OR;  Service: Orthopedics;  Laterality: Right;   TEE WITHOUT CARDIOVERSION N/A 06/01/2021   Procedure: TRANSESOPHAGEAL ECHOCARDIOGRAM (TEE);  Surgeon: Sande Rives, MD;  [facility];  Service: Cardiovascular;  Laterality: N/A;   TONSILLECTOMY AND ADENOIDECTOMY     WRIST FRACTURE SURGERY Left    "crushed it"   Social History   Social History Narrative   Not on file   Immunization History  Administered Date(s) Administered   Influenza,inj,Quad PF,6+ Mos 06/30/2015   Pneumococcal Polysaccharide-23 02/25/2014     Objective: Vital Signs: BP (!) 145/68 (BP Location: Left Arm, Patient Position: Sitting, Cuff Size: Normal)   Pulse 86   Ht 5\' 2"  (1.575 m)   Wt 300 lb (136.1 kg) Comment: per patient  BMI 54.87 kg/m    Physical Exam Vitals and nursing note reviewed.  Constitutional:      Appearance: She is well-developed.  HENT:     Head: Normocephalic and atraumatic.  Eyes:     Conjunctiva/sclera: Conjunctivae normal.  Cardiovascular:     Rate and Rhythm: Normal rate and regular rhythm.     Heart sounds: Normal heart sounds.  Pulmonary:     Effort: Pulmonary effort is normal.     Breath sounds: Normal breath sounds.  Abdominal:     General: Bowel sounds are normal.     Palpations: Abdomen is soft.  Musculoskeletal:     Cervical back: Normal range of  motion.  Lymphadenopathy:     Cervical: No cervical adenopathy.  Skin:    General: Skin is warm and dry.     Capillary Refill: Capillary refill takes less than 2 seconds.     Comments: No malar rash, sclerodactyly or Telengectesia's were noted.  She had good capillary refill in her bilateral hands.  She had excoriation marks on her bilateral upper extremities.  Neurological:     Mental Status: She is alert and oriented to person, place, and time.  Psychiatric:        Behavior: Behavior normal.      Musculoskeletal Exam: She had discomfort with range of motion of her cervical spine.  Thoracic kyphosis was noted.  Thoracic and lumbar spine were difficult to assess in the wheelchair.  She had good range of motion of bilateral shoulder joints with some discomfort.  Elbow joints with good range of motion.  Wrist joints, MCPs PIPs and DIPs with good range of motion.  She has some PIP and DIP thickening.  Hip joint was difficult to assess in the sitting position.  Left knee joint was in good range of motion with some discomfort.  Her left lower extremity was wrapped due to recent cellulitis.  CDAI Exam: CDAI Score: -- Patient Global: --; Provider Global: -- Swollen: --; Tender: -- Joint Exam 04/07/2022   No joint exam has been documented for this visit   There is currently no information documented on the homunculus. Go to the Rheumatology activity and complete the homunculus joint exam.  Investigation: No additional findings.  Imaging: No results found.  Recent Labs: Lab Results  Component Value Date   WBC 9.8 01/06/2022   HGB 9.8 (L) 01/06/2022   PLT 292.0 01/06/2022  NA 141 01/20/2022   K 3.9 01/20/2022   CL 101 01/20/2022   CO2 26 01/20/2022   GLUCOSE 144 (H) 01/20/2022   BUN 14 01/20/2022   CREATININE 0.95 01/20/2022   BILITOT 0.3 12/23/2021   ALKPHOS 87 12/23/2021   AST 37 12/23/2021   ALT 26 12/23/2021   PROT 6.9 12/23/2021   ALBUMIN 3.0 (L) 12/23/2021   CALCIUM  9.1 01/20/2022   GFRAA 87 05/04/2020    Speciality Comments: No specialty comments available.  Procedures:  No procedures performed Allergies: Fish allergy, Fish oil, Omega-3 fatty acids, Crestor [rosuvastatin], Gabapentin, Lovastatin, Metronidazole, Red yeast rice [cholestin], Rotigotine, Atrovent hfa [ipratropium bromide hfa], Iodine, Other, Pepto-bismol [bismuth subsalicylate], Victoza [liraglutide], Enalapril, Suprep [na sulfate-k sulfate-mg sulf], and Tape   Assessment / Plan:     Visit Diagnoses: 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[place]) -patient was given the diagnosis of connective tissue disease overlap syndrome by Dr. Franki Monte in 2011 in [place].  She has been on hydroxychloroquine 1 tablet p.o. twice daily since then.  Patient gives history of dry mouth and dry eyes.  She also gives history of joint pain myalgias and generalized pain with fatigue.  She gives history of fever blisters but not typical oral ulcers or nasal ulcers.  She denies any discoloration in her hands but notices fingernails to be white.  I am uncertain about the diagnosis of connective tissue disease.  Will obtain lab work today.  Plan: CBC with Differential/Platelet, COMPLETE METABOLIC PANEL WITH GFR, Sedimentation rate, ANA, Anti-scleroderma antibody, RNP Antibody, Anti-Smith antibody, Sjogrens syndrome-A extractable nuclear antibody, Sjogrens syndrome-B extractable nuclear antibody, Anti-DNA antibody, double-stranded, C3 and C4, Beta-2 glycoprotein antibodies, Cardiolipin antibodies, IgG, IgM, IgA, Lupus Anticoagulant Eval w/Reflex Patient was counseled on the purpose, proper use, and adverse effects of hydroxychloroquine including nausea/diarrhea, skin rash, headaches, and sun sensitivity.  Advised patient to wear sunscreen once starting hydroxychloroquine to reduce risk of rash associated with sun sensitivity.  Discussed importance of annual eye exams while on hydroxychloroquine to monitor to ocular toxicity  and discussed importance of frequent laboratory monitoring.  Provided patient with eye exam form for baseline ophthalmologic exam.  Reviewed risk for QTC prolongation when used in combination with other QTc prolonging agents (including but not limited to antiarrhythmics, macrolide antibiotics, flouroquinolones, tricyclic antidepressants, citalopram, specific antipsychotics, ondansetron, migraine triptans, and methadone). Provided patient with educational materials on hydroxychloroquine and answered all questions.  Patient consented to hydroxychloroquine. Will upload consent in the media tab.     High risk medication use-she is on hydroxychloroquine 200 mg p.o. twice daily since 2011.  It puts her at increased risk of getting recurrent infections.  We will make further decisions after obtaining labs.  Side effects of hydroxychloroquine were discussed.  A handout was given and consent was taken.  She was also advised to get an annual eye examination to screen for ocular toxicity.  I will get labs today.  Information on immunization was placed in the AVS.  Pain in both hands -she complains of discomfort in her bilateral hands.  No synovitis was noted.  Clinical findings are consistent with osteoarthritis.  I will obtain following labs.  Plan: Rheumatoid factor, Cyclic citrul peptide antibody, IgG  Primary osteoarthritis of left foot-I reviewed x-rays of her left foot which are consistent with osteoarthritis.  Fibromyalgia-patient gives history of fibromyalgia for many years.  Her symptoms started almost 25 years ago with increased pain fatigue and generalized discomfort.  She has been going to pain management.  Chronic  pain associated with significant psychosocial dysfunction-she goes to pain management.  She is also having phantom pain after the right above-knee amputation.  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  She has a current episode of cellulitis on her left lower extremity.  Streptococcal bacteremia - recurrent approximately 12 times  Peripheral vascular disease, unspecified ([place])  Essential hypertension  CAD S/P percutaneous coronary angioplasty  Acute on chronic diastolic CHF (congestive heart failure) (HCC)  Moderate persistent asthma with acute exacerbation  NAFLD (nonalcoholic fatty liver disease)  Gastroesophageal reflux disease without esophagitis  Adaptive colitis  Type 2 diabetes mellitus with hyperglycemia, with long-term current use of insulin (HCC)  History of hypothyroidism  Deafness, sensorineural  H/O Clostridium difficile infection    Orders: Orders Placed This Encounter  Procedures   CBC with Differential/Platelet   COMPLETE METABOLIC PANEL WITH GFR   Sedimentation rate   Rheumatoid factor   Cyclic citrul peptide antibody, IgG   ANA   Anti-scleroderma antibody   RNP Antibody   Anti-Smith antibody   Sjogrens syndrome-A extractable nuclear antibody   Sjogrens syndrome-B extractable nuclear antibody   Anti-DNA antibody, double-stranded   C3 and C4   Beta-2 glycoprotein antibodies   Cardiolipin antibodies, IgG, IgM, IgA   Lupus Anticoagulant Eval w/Reflex   No orders of the defined types were placed in this encounter.    Follow-Up Instructions: Return for MCTD.   Bo Merino, MD  Note - This record has been created using Editor, commissioning.  Chart creation errors have been sought, but may not always  have been located. Such creation errors do not reflect on  the standard of medical care.

## 2022-03-30 ENCOUNTER — Encounter: Payer: Self-pay | Admitting: Registered Nurse

## 2022-03-30 ENCOUNTER — Encounter: Payer: 59 | Attending: Registered Nurse | Admitting: Registered Nurse

## 2022-03-30 VITALS — BP 105/68 | HR 83 | Ht 62.0 in | Wt 304.4 lb

## 2022-03-30 DIAGNOSIS — M7062 Trochanteric bursitis, left hip: Secondary | ICD-10-CM | POA: Insufficient documentation

## 2022-03-30 DIAGNOSIS — M7061 Trochanteric bursitis, right hip: Secondary | ICD-10-CM | POA: Diagnosis not present

## 2022-03-30 DIAGNOSIS — M545 Low back pain, unspecified: Secondary | ICD-10-CM

## 2022-03-30 DIAGNOSIS — L03116 Cellulitis of left lower limb: Secondary | ICD-10-CM

## 2022-03-30 DIAGNOSIS — G8929 Other chronic pain: Secondary | ICD-10-CM | POA: Diagnosis present

## 2022-03-30 DIAGNOSIS — M25511 Pain in right shoulder: Secondary | ICD-10-CM | POA: Diagnosis not present

## 2022-03-30 DIAGNOSIS — L89152 Pressure ulcer of sacral region, stage 2: Secondary | ICD-10-CM

## 2022-03-30 DIAGNOSIS — M2550 Pain in unspecified joint: Secondary | ICD-10-CM

## 2022-03-30 DIAGNOSIS — M546 Pain in thoracic spine: Secondary | ICD-10-CM | POA: Insufficient documentation

## 2022-03-30 MED ORDER — OXYCODONE HCL 20 MG PO TABS: 1.0000 | ORAL_TABLET | Freq: Three times a day (TID) | ORAL | 0 refills | Status: DC | PRN

## 2022-03-30 NOTE — Progress Notes (Signed)
Subjective:    Patient ID: [name], female    DOB: 01-25-1961, 61 y.o.   MRN: [id number]  HPI: [name] is a 61 y.o. female who returns for follow up appointment for chronic pain and medication refill. She states her  pain is located in her right shoulder, mid- lower back pain, bilateral hip pain. She also reports generalized joint pain. She rates her pain 7. Her current exercise regime is walking and performing stretching exercises.  Ms. Armes Morphine equivalent is 87.00 MME.   Last Oral Swab was Performed on 02/27/2022, it was consistent.     Pain Inventory Average Pain 6 Pain Right Now 7 My pain is intermittent, constant, sharp, burning, dull, stabbing, tingling, aching, and spasms  In the last 24 hours, has pain interfered with the following? General activity 9 Relation with others 9 Enjoyment of life 9 What TIME of day is your pain at its worst? morning , evening, and night Sleep (in general) Poor  Pain is worse with: walking, bending, sitting, inactivity, standing, some activites, and rain Pain improves with: rest, heat/ice, medication, and injections Relief from Meds: 9  Family History  Problem Relation Age of Onset   CAD Mother    Hypertension Mother    Heart attack Mother    CAD Father    Heart attack Father    Allergic rhinitis Father    Asthma Father    Hypertension Brother    Hypertension Brother    Pancreatic cancer Paternal Aunt    Breast cancer Paternal Aunt    Lung cancer Paternal Aunt    Asthma Son    Social History   Socioeconomic History   Marital status: Married    Spouse name: [name] [place]   Number of children: 1   Years of education: 11   Highest education level: GED or equivalent  Occupational History    Comment: Not currrently working ( last worked 2004)  Tobacco Use   Smoking status: Former    Packs/day: 1.00    Years: 19.00    Total pack years: 19.00    Types: Cigarettes    Start date: 10    Quit date: 02/11/2004     Years since quitting: 18.1    Passive exposure: Past   Smokeless tobacco: Never  Vaping Use   Vaping Use: Never used  Substance and Sexual Activity   Alcohol use: Yes    Comment: RARE Wine   Drug use: Not Currently    Types: Marijuana    Comment: "only in my teens"   Sexual activity: Yes    Birth control/protection: Surgical    Comment: Hysterectomy  Other Topics Concern   Not on file  Social History Narrative   Not on file   Social Determinants of Health   Financial Resource Strain: Low Risk  (12/27/2021)   Overall Financial Resource Strain (CARDIA)    Difficulty of Paying Living Expenses: Not hard at all  Food Insecurity: No Food Insecurity (12/27/2021)   Hunger Vital Sign    Worried About Running Out of Food in the Last Year: Never true    Ran Out of Food in the Last Year: Never true  Transportation Needs: No Transportation Needs (08/25/2021)   PRAPARE - Hydrologist (Medical): No    Lack of Transportation (Non-Medical): No  Recent Concern: Transportation Needs - Unmet Transportation Needs (08/09/2021)   PRAPARE - Hydrologist (Medical): Yes  Lack of Transportation (Non-Medical): Yes  Physical Activity: Inactive (08/25/2021)   Exercise Vital Sign    Days of Exercise per Week: 0 days    Minutes of Exercise per Session: 0 min  Stress: Stress Concern Present (08/25/2021)   [employer] of Occupational Health - Occupational Stress Questionnaire    Feeling of Stress : Very much  Social Connections: Moderately Integrated (08/25/2021)   Social Connection and Isolation Panel [NHANES]    Frequency of Communication with Friends and Family: More than three times a week    Frequency of Social Gatherings with Friends and Family: More than three times a week    Attends Religious Services: More than 4 times per year    Active Member of [employer] or Organizations: No    Attends Banker Meetings: Never     Marital Status: Married   Past Surgical History:  Procedure Laterality Date   ABDOMINAL HYSTERECTOMY  06/2005   "w/right ovariy"   AMPUTATION Right 01/28/2020    RIGHT FOURTH AND FIFTH RAY AMPUTATION   AMPUTATION Right 01/28/2020   Procedure: RIGHT FOURTH AND FIFTH RAY AMPUTATION,;  Surgeon: [name], MD;  Location: [facility] OR;  Service: Orthopedics;  Laterality: Right;   AMPUTATION Right 06/01/2021   Procedure: RIGHT BELOW KNEE AMPUTATION;  Surgeon: [name], MD;  Location: [facility] OR;  Service: Orthopedics;  Laterality: Right;   AMPUTATION Right 07/22/2021   Procedure: RIGHT ABOVE KNEE AMPUTATION;  Surgeon: [name], MD;  Location: [facility] OR;  Service: Orthopedics;  Laterality: Right;   APPENDECTOMY     BREAST BIOPSY Bilateral    9 total (04/18/2018)   CORONARY ANGIOPLASTY WITH STENT PLACEMENT  10/01/2015   normal LM, 30% LAD, 85% mid RCA and 95% distal RCA s/p PCI of the mid to distal RCA and now on DAPT with ASA and Ticagrelor.     CORONARY STENT INTERVENTION Right 04/18/2018   Procedure: CORONARY STENT INTERVENTION;  Surgeon: [name], MD;  Location: [facility];  Service: Cardiovascular;  Laterality: Right;   DILATION AND CURETTAGE OF UTERUS     FRACTURE SURGERY     LAPAROSCOPIC CHOLECYSTECTOMY     LEFT HEART CATH AND CORONARY ANGIOGRAPHY N/A 04/18/2018   Procedure: LEFT HEART CATH AND CORONARY ANGIOGRAPHY;  Surgeon: [name], MD;  [facility];  Service: Cardiovascular;  Laterality: N/A;   MUSCLE BIOPSY Left    "leg"   OOPHORECTOMY  07/2010   RADIOLOGY WITH ANESTHESIA N/A 05/25/2016   Procedure: RADIOLOGY WITH ANESTHESIA;  Surgeon: Medication Radiologist, MD;  Location: [facility] OR;  Service: Radiology;  Laterality: N/A;   STUMP REVISION Right 06/29/2021   Procedure: REVISION RIGHT BELOW KNEE AMPUTATION;  Surgeon: [name], MD;  [facility];  Service: Orthopedics;  Laterality: Right;   TEE WITHOUT CARDIOVERSION N/A 06/01/2021    Procedure: TRANSESOPHAGEAL ECHOCARDIOGRAM (TEE);  Surgeon: Sande Rives, MD;  Location: [facility] OR;  Service: Cardiovascular;  Laterality: N/A;   TONSILLECTOMY AND ADENOIDECTOMY     WRIST FRACTURE SURGERY Left    "crushed it"   Past Surgical History:  Procedure Laterality Date   ABDOMINAL HYSTERECTOMY  06/2005   "w/right ovariy"   AMPUTATION Right 01/28/2020    RIGHT FOURTH AND FIFTH RAY AMPUTATION   AMPUTATION Right 01/28/2020   Procedure: RIGHT FOURTH AND FIFTH RAY AMPUTATION,;  Surgeon: [name], MD;  Location: [facility] OR;  Service: Orthopedics;  Laterality: Right;   AMPUTATION Right 06/01/2021  Procedure: RIGHT BELOW KNEE AMPUTATION;  Surgeon: Newt Minion, MD;  Location: [place];  Service: Orthopedics;  Laterality: Right;   AMPUTATION Right 07/22/2021   Procedure: RIGHT ABOVE KNEE AMPUTATION;  Surgeon: Newt Minion, MD;  Location: [place];  Service: Orthopedics;  Laterality: Right;   APPENDECTOMY     BREAST BIOPSY Bilateral    9 total (04/18/2018)   CORONARY ANGIOPLASTY WITH STENT PLACEMENT  10/01/2015   normal LM, 30% LAD, 85% mid RCA and 95% distal RCA s/p PCI of the mid to distal RCA and now on DAPT with ASA and Ticagrelor.     CORONARY STENT INTERVENTION Right 04/18/2018   Procedure: CORONARY STENT INTERVENTION;  Surgeon: Burnell Blanks, MD;  Location: [place] CV LAB;  Service: Cardiovascular;  Laterality: Right;   DILATION AND CURETTAGE OF UTERUS     FRACTURE SURGERY     LAPAROSCOPIC CHOLECYSTECTOMY     LEFT HEART CATH AND CORONARY ANGIOGRAPHY N/A 04/18/2018   Procedure: LEFT HEART CATH AND CORONARY ANGIOGRAPHY;  Surgeon: Burnell Blanks, MD;  Location: [place] CV LAB;  Service: Cardiovascular;  Laterality: N/A;   MUSCLE BIOPSY Left    "leg"   OOPHORECTOMY  07/2010   RADIOLOGY WITH ANESTHESIA N/A 05/25/2016   Procedure: RADIOLOGY WITH ANESTHESIA;  Surgeon: Medication Radiologist, MD;  Location: [place];  Service: Radiology;  Laterality: N/A;   STUMP  REVISION Right 06/29/2021   Procedure: REVISION RIGHT BELOW KNEE AMPUTATION;  Surgeon: Newt Minion, MD;  Location: [place];  Service: Orthopedics;  Laterality: Right;   TEE WITHOUT CARDIOVERSION N/A 06/01/2021   Procedure: TRANSESOPHAGEAL ECHOCARDIOGRAM (TEE);  Surgeon: Geralynn Rile, MD;  Location: [place];  Service: Cardiovascular;  Laterality: N/A;   TONSILLECTOMY AND ADENOIDECTOMY     WRIST FRACTURE SURGERY Left    "crushed it"   Past Medical History:  Diagnosis Date   Aortic stenosis    mild AS by echo 02/2021   Asthma    "on daily RX and rescue inhaler" (04/18/2018)   Chronic diastolic CHF (congestive heart failure) ([place]) 09/2015   Chronic lower back pain    Chronic neck pain    Chronic pain syndrome    Fentanyl Patch   Colon polyps    Coronary artery disease    cath with normal LM, 30% LAD, 85% mid RCA and 95% distal RCA s/p PCI of the mid to distal RCA and now on DAPT with ASA and Ticagrelor.     Eczema    Excessive daytime sleepiness 11/26/2015   Fibromyalgia    Gallstones    GERD (gastroesophageal reflux disease)    Heart murmur    "noted for the 1st time on 04/18/2018"   History of blood transfusion 07/2010   "S/P oophorectomy"   History of gout    History of hiatal hernia 1980s   "gone now" (04/18/2018)   Hyperlipidemia    Hypertension    takes Metoprolol and Enalapril daily   Hypothyroidism    takes Synthroid daily   IBS (irritable bowel syndrome)    Migraine    "nothing in the 2000s" (04/18/2018)   Mixed connective tissue disease ([place])    NAFLD (nonalcoholic fatty liver disease)    Pneumonia    "several times" (04/18/2018)   PVC's (premature ventricular contractions)    noted on event monitor 02/2021   Rheumatoid arthritis ([place])    "hands, elbows, shoulders, probably knees" (04/18/2018)   Scoliosis    Sleep apnea    mild -  does not use cpap   Spondylosis    Type II diabetes mellitus (HCC)    takes Metformin and Hum R daily (04/18/2018)   Walker as  ambulation aid    also uses wheelchair   BP 105/68   Pulse 83   Ht 5\' 2"  (1.575 m)   Wt (!) 304 lb 6.4 oz (138.1 kg)   SpO2 96%   BMI 55.68 kg/m   Opioid Risk Score:   Fall Risk Score:  `1  Depression screen [facility] 2/9     03/30/2022   11:08 AM 02/27/2022    1:37 PM 01/31/2022   10:18 AM 12/20/2021   10:56 AM 10/24/2021   12:21 PM 09/27/2021    9:50 AM 08/25/2021    8:56 PM  Depression screen PHQ 2/9  Decreased Interest 1 1 0 3 1 0 0  Down, Depressed, Hopeless 1 1 0 3 1 0 1  PHQ - 2 Score 2 2 0 6 2 0 1  Altered sleeping      0   PHQ-9 Score      0     Review of Systems  Eyes:  Positive for photophobia.  Musculoskeletal:  Positive for back pain and gait problem.       Right shoulder, both legs, right stump pain  All other systems reviewed and are negative.      Objective:   Physical Exam Vitals and nursing note reviewed.  Constitutional:      Appearance: Normal appearance.  Cardiovascular:     Rate and Rhythm: Normal rate and regular rhythm.     Pulses: Normal pulses.     Heart sounds: Normal heart sounds.  Pulmonary:     Effort: Pulmonary effort is normal.     Breath sounds: Normal breath sounds.  Musculoskeletal:     Cervical back: Normal range of motion and neck supple.     Comments: Normal Muscle Bulk and Muscle Testing Reveals:  Upper Extremities:Decreased ROM 90 Degrees and Muscle Strength 5/5 Lumbar Paraspinal Tenderness: L-3-L-5 Bilateral Greater Trochanter Tenderness Lower Extremities: Right: AKA Left Lower Extremity with dressing intact Arrived in wheelchair      Skin:    General: Skin is warm and dry.  Neurological:     Mental Status: She is alert and oriented to person, place, and time.  Psychiatric:        Mood and Affect: Mood normal.        Behavior: Behavior normal.         Assessment & Plan:  HX: of Right AKA: Dr 4/9 Following. Continue to Monitor. 03/30/2022 Diabetic Polyneuropathy associated with Type 2 DM: Continue current  medication regimen with Lyrica. Continue to Monitor. 03/30/2022 DM Type 2: PCP Following. Continue current medication regimen. Continue to Monitor. 03/30/2022 Chronic  Pain Syndrome : Refilled: Oxycodone 20 mg one tablet every 8 hours as needed for pain #90. We will continue the opioid monitoring program, this consists of regular clinic visits, examinations, urine drug screen, pill counts as well as use of 04/01/2022 Controlled Substance Reporting system. A 12 month History has been reviewed on the [place] Controlled Substance Reporting System on 03/30/2022 Stage 1 Decubitus: Left Buttock and Left Lower Extremity: Continue Adequate Nutrition and Weight Shifting.  She states Wound Care  is Following. She  Continue to Monitor. 03/30/2022  6. Cellulitis: Ms. Schommer was hospitalized on 04/07-2022-o4/18/2023, discharge summary was reviewed.  Continu to Monitpr. PCP and [facility] Care Following. 7. Phantom Pain: Continue Gabapentin  Continue  to Monitor. 03/30/2022 8. Polyarthralgia: Continue HEP as Tolerated. Continue to Monitor. 03/30/2022   F/U in 1 Month

## 2022-03-31 ENCOUNTER — Other Ambulatory Visit: Payer: Self-pay | Admitting: Family Medicine

## 2022-04-07 ENCOUNTER — Encounter: Payer: Self-pay | Admitting: Rheumatology

## 2022-04-07 ENCOUNTER — Ambulatory Visit (INDEPENDENT_AMBULATORY_CARE_PROVIDER_SITE_OTHER): Payer: 59 | Admitting: Rheumatology

## 2022-04-07 VITALS — BP 145/68 | HR 86 | Resp 18 | Ht 62.0 in | Wt 300.0 lb

## 2022-04-07 DIAGNOSIS — M79642 Pain in left hand: Secondary | ICD-10-CM

## 2022-04-07 DIAGNOSIS — J4541 Moderate persistent asthma with (acute) exacerbation: Secondary | ICD-10-CM

## 2022-04-07 DIAGNOSIS — B955 Unspecified streptococcus as the cause of diseases classified elsewhere: Secondary | ICD-10-CM

## 2022-04-07 DIAGNOSIS — M5136 Other intervertebral disc degeneration, lumbar region: Secondary | ICD-10-CM | POA: Diagnosis not present

## 2022-04-07 DIAGNOSIS — L89152 Pressure ulcer of sacral region, stage 2: Secondary | ICD-10-CM

## 2022-04-07 DIAGNOSIS — Z79899 Other long term (current) drug therapy: Secondary | ICD-10-CM

## 2022-04-07 DIAGNOSIS — R7881 Bacteremia: Secondary | ICD-10-CM

## 2022-04-07 DIAGNOSIS — Z8639 Personal history of other endocrine, nutritional and metabolic disease: Secondary | ICD-10-CM

## 2022-04-07 DIAGNOSIS — M351 Other overlap syndromes: Secondary | ICD-10-CM

## 2022-04-07 DIAGNOSIS — I2510 Atherosclerotic heart disease of native coronary artery without angina pectoris: Secondary | ICD-10-CM

## 2022-04-07 DIAGNOSIS — M797 Fibromyalgia: Secondary | ICD-10-CM | POA: Diagnosis not present

## 2022-04-07 DIAGNOSIS — M79641 Pain in right hand: Secondary | ICD-10-CM | POA: Diagnosis not present

## 2022-04-07 DIAGNOSIS — G894 Chronic pain syndrome: Secondary | ICD-10-CM

## 2022-04-07 DIAGNOSIS — I10 Essential (primary) hypertension: Secondary | ICD-10-CM

## 2022-04-07 DIAGNOSIS — I739 Peripheral vascular disease, unspecified: Secondary | ICD-10-CM

## 2022-04-07 DIAGNOSIS — I5033 Acute on chronic diastolic (congestive) heart failure: Secondary | ICD-10-CM

## 2022-04-07 DIAGNOSIS — E1165 Type 2 diabetes mellitus with hyperglycemia: Secondary | ICD-10-CM

## 2022-04-07 DIAGNOSIS — T8781 Dehiscence of amputation stump: Secondary | ICD-10-CM

## 2022-04-07 DIAGNOSIS — M19072 Primary osteoarthritis, left ankle and foot: Secondary | ICD-10-CM

## 2022-04-07 DIAGNOSIS — Z794 Long term (current) use of insulin: Secondary | ICD-10-CM

## 2022-04-07 DIAGNOSIS — Z8619 Personal history of other infectious and parasitic diseases: Secondary | ICD-10-CM

## 2022-04-07 DIAGNOSIS — L03119 Cellulitis of unspecified part of limb: Secondary | ICD-10-CM

## 2022-04-07 DIAGNOSIS — K219 Gastro-esophageal reflux disease without esophagitis: Secondary | ICD-10-CM

## 2022-04-07 DIAGNOSIS — G720 Drug-induced myopathy: Secondary | ICD-10-CM

## 2022-04-07 DIAGNOSIS — M3589 Other specified systemic involvement of connective tissue: Secondary | ICD-10-CM

## 2022-04-07 DIAGNOSIS — K760 Fatty (change of) liver, not elsewhere classified: Secondary | ICD-10-CM

## 2022-04-07 DIAGNOSIS — I872 Venous insufficiency (chronic) (peripheral): Secondary | ICD-10-CM

## 2022-04-07 DIAGNOSIS — K589 Irritable bowel syndrome without diarrhea: Secondary | ICD-10-CM

## 2022-04-07 DIAGNOSIS — Z9861 Coronary angioplasty status: Secondary | ICD-10-CM

## 2022-04-07 DIAGNOSIS — H905 Unspecified sensorineural hearing loss: Secondary | ICD-10-CM

## 2022-04-07 DIAGNOSIS — S78111A Complete traumatic amputation at level between right hip and knee, initial encounter: Secondary | ICD-10-CM

## 2022-04-07 DIAGNOSIS — T466X5A Adverse effect of antihyperlipidemic and antiarteriosclerotic drugs, initial encounter: Secondary | ICD-10-CM

## 2022-04-07 NOTE — Patient Instructions (Signed)
Hydroxychloroquine Tablets What is this medication? HYDROXYCHLOROQUINE (hye drox ee KLOR oh kwin) treats autoimmune conditions, such as rheumatoid arthritis and lupus. It works by slowing down an overactive immune system. It may also be used to prevent and treat malaria. It works by killing the parasite that causes malaria. It belongs to a group of medications called DMARDs. This medicine may be used for other purposes; ask your health care provider or pharmacist if you have questions. COMMON BRAND NAME(S): Plaquenil, Quineprox What should I tell my care team before I take this medication? They need to know if you have any of these conditions: Diabetes Eye disease, vision problems G6PD deficiency Heart disease History of irregular heartbeat If you often drink alcohol Kidney disease Liver disease Porphyria Psoriasis An unusual or allergic reaction to chloroquine, hydroxychloroquine, other medications, foods, dyes, or preservatives Pregnant or trying to get pregnant Breast-feeding How should I use this medication? Take this medication by mouth with a glass of water. Take it as directed on the prescription label. Do not cut, crush or chew this medication. Swallow the tablets whole. Take it with food. Do not take it more than directed. Take all of this medication unless your care team tells you to stop it early. Keep taking it even if you think you are better. Take products with antacids in them at a different time of day than this medication. Take this medication 4 hours before or 4 hours after antacids. Talk to your care team if you have questions. Talk to your care team about the use of this medication in children. While this medication may be prescribed for selected conditions, precautions do apply. Overdosage: If you think you have taken too much of this medicine contact a poison control center or emergency room at once. NOTE: This medicine is only for you. Do not share this medicine with  others. What if I miss a dose? If you miss a dose, take it as soon as you can. If it is almost time for your next dose, take only that dose. Do not take double or extra doses. What may interact with this medication? Do not take this medication with any of the following: Cisapride Dronedarone Pimozide Thioridazine This medication may also interact with the following: Ampicillin Antacids Cimetidine Cyclosporine Digoxin Kaolin Medications for diabetes, like insulin, glipizide, glyburide Medications for seizures like carbamazepine, phenobarbital, phenytoin Mefloquine Methotrexate Other medications that prolong the QT interval (cause an abnormal heart rhythm) Praziquantel This list may not describe all possible interactions. Give your health care provider a list of all the medicines, herbs, non-prescription drugs, or dietary supplements you use. Also tell them if you smoke, drink alcohol, or use illegal drugs. Some items may interact with your medicine. What should I watch for while using this medication? Visit your care team for regular checks on your progress. Tell your care team if your symptoms do not start to get better or if they get worse. You may need blood work done while you are taking this medication. If you take other medications that can affect heart rhythm, you may need more testing. Talk to your care team if you have questions. Your vision may be tested before and during use of this medication. Tell your care team right away if you have any change in your eyesight. This medication may cause serious skin reactions. They can happen weeks to months after starting the medication. Contact your care team right away if you notice fevers or flu-like symptoms with a rash. The   rash may be red or purple and then turn into blisters or peeling of the skin. Or, you might notice a red rash with swelling of the face, lips or lymph nodes in your neck or under your arms. If you or your family  notice any changes in your behavior, such as new or worsening depression, thoughts of harming yourself, anxiety, or other unusual or disturbing thoughts, or memory loss, call your care team right away. What side effects may I notice from receiving this medication? Side effects that you should report to your care team as soon as possible: Allergic reactions--skin rash, itching, hives, swelling of the face, lips, tongue, or throat Aplastic anemia--unusual weakness or fatigue, dizziness, headache, trouble breathing, increased bleeding or bruising Change in vision Heart rhythm changes--fast or irregular heartbeat, dizziness, feeling faint or lightheaded, chest pain, trouble breathing Infection--fever, chills, cough, or sore throat Low blood sugar (hypoglycemia)--tremors or shaking, anxiety, sweating, cold or clammy skin, confusion, dizziness, rapid heartbeat Muscle injury--unusual weakness or fatigue, muscle pain, dark yellow or brown urine, decrease in amount of urine Pain, tingling, or numbness in the hands or feet Rash, fever, and swollen lymph nodes Redness, blistering, peeling, or loosening of the skin, including inside the mouth Thoughts of suicide or self-harm, worsening mood, or feelings of depression Unusual bruising or bleeding Side effects that usually do not require medical attention (report to your care team if they continue or are bothersome): Diarrhea Headache Nausea Stomach pain Vomiting This list may not describe all possible side effects. Call your doctor for medical advice about side effects. You may report side effects to FDA at 1-800-FDA-1088. Where should I keep my medication? Keep out of the reach of children and pets. Store at room temperature up to 30 degrees C (86 degrees F). Protect from light. Get rid of any unused medication after the expiration date. To get rid of medications that are no longer needed or have expired: Take the medication to a medication take-back  program. Check with your pharmacy or law enforcement to find a location. If you cannot return the medication, check the label or package insert to see if the medication should be thrown out in the garbage or flushed down the toilet. If you are not sure, ask your care team. If it is safe to put it in the trash, empty the medication out of the container. Mix the medication with cat litter, dirt, coffee grounds, or other unwanted substance. Seal the mixture in a bag or container. Put it in the trash. NOTE: This sheet is a summary. It may not cover all possible information. If you have questions about this medicine, talk to your doctor, pharmacist, or health care provider.  2023 Elsevier/Gold Standard (2021-01-13 00:00:00)  Vaccines You are taking a medication(s) that can suppress your immune system.  The following immunizations are recommended: Flu annually Covid-19  Td/Tdap (tetanus, diphtheria, pertussis) every 10 years Pneumonia (Prevnar 15 then Pneumovax 23 at least 1 year apart.  Alternatively, can take Prevnar 20 without needing additional dose) Shingrix: 2 doses from 4 weeks to 6 months apart  Please check with your PCP to make sure you are up to date.

## 2022-04-10 NOTE — Progress Notes (Signed)
I will discuss results at the follow-up visit.  Please forward a copy of the lab results to Dr. Carmelia Roller

## 2022-04-14 ENCOUNTER — Other Ambulatory Visit: Payer: Self-pay | Admitting: Family Medicine

## 2022-04-16 LAB — ANTI-DNA ANTIBODY, DOUBLE-STRANDED: ds DNA Ab: <1 IU/mL

## 2022-04-16 LAB — COMPLETE METABOLIC PANEL WITH GFR
AG Ratio: 1.2 (calc) (ref 1.0–2.5)
ALT: 45 U/L — ABNORMAL HIGH (ref 6–29)
AST: 55 U/L — ABNORMAL HIGH (ref 10–35)
Albumin: 3.7 g/dL (ref 3.6–5.1)
Alkaline phosphatase (APISO): 65 U/L (ref 37–153)
BUN: 10 mg/dL (ref 7–25)
CO2: 28 mmol/L (ref 20–32)
Calcium: 8.9 mg/dL (ref 8.6–10.4)
Chloride: 100 mmol/L (ref 98–110)
Creat: 0.78 mg/dL (ref 0.50–1.05)
Globulin: 3 g/dL (calc) (ref 1.9–3.7)
Glucose, Bld: 195 mg/dL — ABNORMAL HIGH (ref 65–99)
Potassium: 3.8 mmol/L (ref 3.5–5.3)
Sodium: 140 mmol/L (ref 135–146)
Total Bilirubin: 0.3 mg/dL (ref 0.2–1.2)
Total Protein: 6.7 g/dL (ref 6.1–8.1)
eGFR: 87 mL/min/{1.73_m2} (ref >=60)

## 2022-04-16 LAB — SEDIMENTATION RATE: Sed Rate: 31 mm/h — ABNORMAL HIGH (ref 0–30)

## 2022-04-16 LAB — C3 AND C4
C3 Complement: 183 mg/dL (ref 83–193)
C4 Complement: 31 mg/dL (ref 15–57)

## 2022-04-16 LAB — LUPUS ANTICOAGULANT EVAL W/ REFLEX
PTT-LA Screen: 34 s (ref <=40)
dRVVT: 33 s (ref <=45)

## 2022-04-16 LAB — SJOGRENS SYNDROME-B EXTRACTABLE NUCLEAR ANTIBODY: SSB (La) (ENA) Antibody, IgG: <1 AI

## 2022-04-16 LAB — CBC WITH DIFFERENTIAL/PLATELET
Absolute Monocytes: 782 cells/uL (ref 200–950)
Basophils Absolute: 102 cells/uL (ref 0–200)
Basophils Relative: 1.2 %
Eosinophils Absolute: 400 cells/uL (ref 15–500)
Eosinophils Relative: 4.7 %
HCT: 34.4 % — ABNORMAL LOW (ref 35.0–45.0)
Hemoglobin: 9.7 g/dL — ABNORMAL LOW (ref 11.7–15.5)
Lymphs Abs: 2423 cells/uL (ref 850–3900)
MCH: 19.8 pg — ABNORMAL LOW (ref 27.0–33.0)
MCHC: 28.2 g/dL — ABNORMAL LOW (ref 32.0–36.0)
MCV: 70.3 fL — ABNORMAL LOW (ref 80.0–100.0)
MPV: 9.4 fL (ref 7.5–12.5)
Monocytes Relative: 9.2 %
Neutro Abs: 4794 cells/uL (ref 1500–7800)
Neutrophils Relative %: 56.4 %
Platelets: 326 10*3/uL (ref 140–400)
RBC: 4.89 10*6/uL (ref 3.80–5.10)
RDW: 17.7 % — ABNORMAL HIGH (ref 11.0–15.0)
Total Lymphocyte: 28.5 %
WBC: 8.5 10*3/uL (ref 3.8–10.8)

## 2022-04-16 LAB — ANTI-NUCLEAR AB-TITER (ANA TITER): ANA Titer 1: 1:640 {titer} — ABNORMAL HIGH

## 2022-04-16 LAB — RHEUMATOID FACTOR: Rheumatoid fact SerPl-aCnc: <14 IU/mL (ref <14)

## 2022-04-16 LAB — SJOGRENS SYNDROME-A EXTRACTABLE NUCLEAR ANTIBODY: SSA (Ro) (ENA) Antibody, IgG: <1 AI

## 2022-04-16 LAB — CARDIOLIPIN ANTIBODIES, IGG, IGM, IGA
Anticardiolipin IgA: <2 APL-U/mL (ref <20.0)
Anticardiolipin IgG: <2 GPL-U/mL (ref <20.0)
Anticardiolipin IgM: <2 MPL-U/mL (ref <20.0)

## 2022-04-16 LAB — BETA-2 GLYCOPROTEIN ANTIBODIES
Beta-2 Glyco 1 IgA: <2 U/mL (ref <20.0)
Beta-2 Glyco 1 IgM: <2 U/mL (ref <20.0)
Beta-2 Glyco I IgG: <2 U/mL (ref <20.0)

## 2022-04-16 LAB — ANTI-SMITH ANTIBODY: ENA SM Ab Ser-aCnc: <1 AI

## 2022-04-16 LAB — RNP ANTIBODY: Ribonucleic Protein(ENA) Antibody, IgG: 4.4 AI — AB

## 2022-04-16 LAB — ANTI-SCLERODERMA ANTIBODY: Scleroderma (Scl-70) (ENA) Antibody, IgG: <1 AI

## 2022-04-16 LAB — ANA: Anti Nuclear Antibody (ANA): POSITIVE — AB

## 2022-04-16 LAB — CYCLIC CITRUL PEPTIDE ANTIBODY, IGG: Cyclic Citrullin Peptide Ab: <16 UNITS

## 2022-04-17 ENCOUNTER — Other Ambulatory Visit: Payer: Self-pay | Admitting: Cardiology

## 2022-04-17 ENCOUNTER — Other Ambulatory Visit: Payer: Self-pay | Admitting: Family Medicine

## 2022-04-17 ENCOUNTER — Telehealth: Payer: Self-pay | Admitting: Family Medicine

## 2022-04-17 DIAGNOSIS — R7401 Elevation of levels of liver transaminase levels: Secondary | ICD-10-CM

## 2022-04-17 NOTE — Telephone Encounter (Signed)
Pt called stating she would like Dr. Carmelia Roller to review the labs done by Pollyann Savoy, MD on 7.28.23. She stated that there were a couple things on those labs that Dr. Corliss Skains wanted Dr. Carmelia Roller to look at.

## 2022-04-17 NOTE — Telephone Encounter (Signed)
Reviewing the labs it is likely the glucose, anemia and transaminasemia. I see we have an appointment in early Sept. That wouldn't be a bad time to recheck some of these. OK to sched sooner if she wishes. Ty.

## 2022-04-17 NOTE — Telephone Encounter (Signed)
Called informed the patient of PCP response to question She is ok to wait until September

## 2022-04-18 ENCOUNTER — Other Ambulatory Visit: Payer: Self-pay

## 2022-04-18 MED ORDER — DILT-XR 240 MG PO CP24: 240.0000 mg | ORAL_CAPSULE | Freq: Every day | ORAL | 2 refills | Status: DC

## 2022-04-19 ENCOUNTER — Ambulatory Visit: Payer: 59 | Admitting: Pharmacist

## 2022-04-19 DIAGNOSIS — J4540 Moderate persistent asthma, uncomplicated: Secondary | ICD-10-CM

## 2022-04-19 DIAGNOSIS — Z794 Long term (current) use of insulin: Secondary | ICD-10-CM

## 2022-04-19 DIAGNOSIS — I10 Essential (primary) hypertension: Secondary | ICD-10-CM

## 2022-04-19 DIAGNOSIS — R7401 Elevation of levels of liver transaminase levels: Secondary | ICD-10-CM

## 2022-04-19 NOTE — Chronic Care Management (AMB) (Signed)
Chronic Care Management Pharmacy Note  04/19/2022 Name:  [name] MRN:  [id number] DOB:  [birth year]  Summary: Reviewed medication list and updated.  Patient states she has been out of diltiazem for 1 weeks - pharmacy was having difficulty getting refill authorization and also was on backorder. Dr Radford Pax sent in refill today and I confirmed with pharmacy they had product / Rx is ready to pick up.  Last labs showed slight increase in LFTs. Patient has NAFLD but could also be medications related - takes fluconazole for 2 or 3 days from time to time. Also taking hydroxychloroquine 200mg  and acetaminophen 500mg  3 times a day which both could increase LFTs. Patient is scheduled to see rheumatologist 05/05/2022 to review recent labs. Recommend recheck LFTs, if still elevated then consider change in hydroxychloroquine or stop acetaminophen.  Reminded to get annual eye exam - patient reports has appointment scheduled for 07/2022.  Patient reports medications are affordable but that [place] cost varies and is some times > $100. Asked if she is using manufacturer discount card and patient was unsure. Might be than pharmacy is forgetting to use card when cost if higher. Provided web site ([place]medcoup[place]) to get discount card for Farxiga for her and her husband.     Subjective: [name] is an 61 y.o. year old female who is a primary patient of Shelda Pal, DO.  The CCM team was consulted for assistance with disease management and care coordination needs.    Engaged with patient by telephone for follow up visit in response to provider referral for pharmacy case management and/or care coordination services.   Consent to Services:  The patient was given information about Chronic Care Management services, agreed to services, and gave verbal consent prior to initiation of services.  Please see initial visit note for detailed documentation.   Patient Care Team: Shelda Pal, DO as PCP - General (Family Medicine) Sueanne Margarita, MD as PCP - Cardiology (Cardiology) Cherre Robins, RPH-CPP (Pharmacist)  Recent office visits: 03/17/2022 - Fam Med (Dr Nani Ravens) Video Visit for skin irritaiton. Prescribed triamcinolone creasm 0.1% and nustatin powder. 11/16/2021 - PCP (Dr Nani Ravens) Video Visit for possible UTI and eczema flair / allergies. Rx for Betamethasone Dipropionate Aug 0.05 % Topical 2 times daily and Nitrofurantoin Monohyd Macro 100 mg Oral 2 times daily 11/14/2021 - Fam Med - phone call / My Chart message. Refill mupirocin ointment. recommended Video Visit   Recent consult visits: 04/07/2022 - Rheumatology (Dr Estanislado Pandy) mixed connective tissue disease consult (previously diagnosed by other rheumatologist). Labs ordered. Advised to get annual eye exam since taking hydroxychloroquine. 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  03/30/2022 - Phy Med / Rehab Marcello Moores, NP) Seen for right shoulder pain. Seen for pain management. Continued Lyrica for diabetic polyneuropathy and gabapentin for phatom pain. Continue oxycodone and morphine.  02/27/2022 - Phy Med / Pain Management (Dr Ranell Patrick) Increased pregabalin to 50mg  3 times a day and started lidocaine 5% patches.  01/02/2022 - Cardio ([place], [place]) F/U Chronic diastolic heart failure. Recent admission for heart failure exacerbation secondary to noncompliance with medications. Currently takes Lasix 40 mg twice daily and spironolactone 25 mg daily. Recheck BMP.  Not a good candidate for SG LT 2 inhibitor.  Coronary artery disease. No chest pain.  Continue aspirin, Zetia and Imdur; HLD - History of statin intolerance 12/20/2021 - Physical med - pain management Marcello Moores, NP) chronic pain syndrome. Today she has  redness and drainage to left lower extremity with edema noted. Patient sent to ED.  11/21/2021 - Physical med - pain management Marcello Moores, NP) Chronic  Pain Syndrome : Refilled:  Oxycodone 20 mg one tablet every 8 hours as needed for pain #90. We will continue the opioid monitoring program.   Hospital visits: 12/20/2021 to 12/27/2021 [facility] Admission for left leg cellulitis and ulcer - sacral decubitus stage II; acute on chronic CHF.  Medications stopped at discharge - cyclobenzaprine $RemoveBeforeDEI'10mg'isJXkKLuVPCKsavi$ , tizanidien $RemoveBefo'2mg'lPFBmVEkVhJ$  and valsartan $RemoveBefor'160mg'iooazqeXPziI$ .  Medications changed at discharge: furosemide increased to $RemoveBefo'40mg'kBaRQVyqNRS$  twice a day for 3 days, then take $RemoveB'40mg'eJtKylIq$  daily   Objective:  Lab Results  Component Value Date   CREATININE 0.78 04/07/2022   CREATININE 0.95 01/20/2022   CREATININE 1.52 (H) 01/09/2022    Lab Results  Component Value Date   HGBA1C 8.5 (H) 12/22/2021   Last diabetic Eye exam: No results found for: "HMDIABEYEEXA"  Last diabetic Foot exam: No results found for: "HMDIABFOOTEX"      Component Value Date/Time   CHOL 194 10/27/2020 0809   CHOL 176 05/04/2020 0947   TRIG 223.0 (H) 10/27/2020 0809   HDL 46.60 10/27/2020 0809   HDL 41 05/04/2020 0947   CHOLHDL 4 10/27/2020 0809   VLDL 44.6 (H) 10/27/2020 0809   LDLCALC 100 (H) 05/04/2020 0947   LDLDIRECT 121.0 10/27/2020 0809       Latest Ref Rng & Units 04/07/2022    9:04 AM 12/23/2021    2:35 AM 12/20/2021   12:30 PM  Hepatic Function  Total Protein 6.1 - 8.1 g/dL 6.7  6.9  7.0   Albumin 3.5 - 5.0 g/dL  3.0  3.1   AST 10 - 35 U/L 55  37  26   ALT 6 - 29 U/L 45  26  24   Alk Phosphatase 38 - 126 U/L  87  84   Total Bilirubin 0.2 - 1.2 mg/dL 0.3  0.3  0.4     Lab Results  Component Value Date/Time   TSH 2.170 05/04/2020 09:47 AM   TSH 0.624 10/20/2018 02:50 AM   TSH 1.388 01/02/2018 06:09 PM   TSH 2.451  Test methodology is 3rd generation TSH  12/06/2008 04:55 AM       Latest Ref Rng & Units 04/07/2022    9:04 AM 01/06/2022   12:15 PM 12/27/2021    3:21 AM  CBC  WBC 3.8 - 10.8 Thousand/uL 8.5  9.8  10.4   Hemoglobin 11.7 - 15.5 g/dL 9.7  9.8  9.2   Hematocrit 35.0 - 45.0 % 34.4  32.9  32.9   Platelets  140 - 400 Thousand/uL 326  292.0  287     Lab Results  Component Value Date/Time   VD25OH 69.04 07/25/2017 11:55 AM    Clinical ASCVD: Yes  The 10-year ASCVD risk score (Arnett DK, et al., 2019) is: 11.6%   Values used to calculate the score:     Age: 37 years     Sex: Female     Is Non-Hispanic African American: No     Diabetic: Yes     Tobacco smoker: No     Systolic Blood Pressure: 161 mmHg     Is BP treated: Yes     HDL Cholesterol: 46.6 mg/dL     Total Cholesterol: 194 mg/dL     Social History   Tobacco Use  Smoking Status Former   Packs/day: 1.00   Years: 19.00   Total pack  years: 19.00   Types: Cigarettes   Start date: 68   Quit date: 02/11/2004   Years since quitting: 18.1   Passive exposure: Past  Smokeless Tobacco Never   BP Readings from Last 3 Encounters:  04/07/22 (!) 145/68  03/30/22 105/68  02/27/22 (!) 149/79   Pulse Readings from Last 3 Encounters:  04/07/22 86  03/30/22 83  02/27/22 (!) 104   Wt Readings from Last 3 Encounters:  04/07/22 300 lb (136.1 kg)  03/30/22 (!) 304 lb 6.4 oz (138.1 kg)  01/31/22 291 lb 9.6 oz (132.3 kg)    Assessment: Review of patient past medical history, allergies, medications, health status, including review of consultants reports, laboratory and other test data, was performed as part of comprehensive evaluation and provision of chronic care management services.   SDOH:  (Social Determinants of Health) assessments and interventions performed:    CCM Care Plan  Allergies  Allergen Reactions   Fish Allergy Anaphylaxis    INCLUDES OMEGA 3 OILS   Fish Oil Anaphylaxis and Swelling    THROAT SWELLS INCLUDES FISH AS A CLASS   Omega-3 Fatty Acids Swelling    Throat swelling  Has tolerated Glucerna nutritional drinks    Crestor [Rosuvastatin] Other (See Comments)    Extreme joint pain, to this & other statins   Gabapentin Anxiety    Anxiety on high doses   Lovastatin Other (See Comments)    EXTREME JOINT  PAIN   Metronidazole Nausea Only and Swelling    Headache and shakes Nausea and vomiting   Red Yeast Rice [Cholestin] Other (See Comments)    Muscle pain and severe joint pain.    Rotigotine Swelling    (Neupro) Extreme edema    Atrovent Hfa [Ipratropium Bromide Hfa] Other (See Comments)    wheezing   Iodine Swelling   Other Other (See Comments)    Surgical Staples causes redness/infection per patient.   Pepto-Bismol [Bismuth Subsalicylate] Nausea And Vomiting    Extreme vomiting   Victoza [Liraglutide]     Unsure of reaction    Enalapril Cough   Suprep [Na Sulfate-K Sulfate-Mg Sulf] Nausea And Vomiting   Tape Other (See Comments)    Electrodes causes skin breakdown    Medications Reviewed Today     Reviewed by Cherre Robins, RPH-CPP (Pharmacist) on 04/19/22 at 1615  Med List Status: <None>   Medication Order Taking? Sig Documenting Provider Last Dose Status Informant  acetaminophen (TYLENOL) 500 MG tablet [id number] Yes Take 500 mg by mouth every 8 (eight) hours as needed. [provider] Taking Active   albuterol (PROAIR HFA) 108 (90 Base) MCG/ACT inhaler [id number] Yes Inhale 2 puffs into the lungs every 4 (four) hours as needed for wheezing or shortness of breath. Shelda Pal, DO Taking Active Self, Spouse/Significant Other  albuterol (PROVENTIL) (2.5 MG/3ML) 0.083% nebulizer solution [id number] Yes USE 1 VIAL IN NEBULIZER EVERY 4 HOURS AS NEEDED FOR WHEEZING FOR SHORTNESS OF BREATH Shelda Pal, DO Taking Active Self, Spouse/Significant Other  ascorbic acid (VITAMIN C) 500 MG tablet [id number] No Take 1 tablet (500 mg total) by mouth 2 (two) times daily.  Patient not taking: Reported on 04/19/2022   Bary Leriche, PA-C Not Taking Active Self, Spouse/Significant Other  aspirin EC 81 MG tablet [id number] Yes Take 1 tablet (81 mg total) by mouth every evening. Shelda Pal, DO Taking Active Self, Spouse/Significant Other  augmented  betamethasone dipropionate (DIPROLENE AF) 0.05 % cream [id number] Yes Apply topically 2 (two) times  daily. Shelda Pal, DO Taking Active Self, Spouse/Significant Other  cyclobenzaprine (FLEXERIL) 10 MG tablet [id number] No Take 10 mg by mouth 3 (three) times daily as needed for muscle spasms.  Patient not taking: Reported on 04/19/2022   [provider] Not Taking Active   dapagliflozin propanediol (FARXIGA) 10 MG TABS tablet [id number] Yes Take 1 tablet (10 mg total) by mouth daily before breakfast. Shelda Pal, DO Taking Active   diclofenac Sodium (VOLTAREN) 1 % GEL [id number] Yes Apply 2 g topically 4 (four) times daily. Bary Leriche, PA-C Taking Active Self, Spouse/Significant Other           Med Note Marinell Blight Dec 20, 2021  8:08 PM)    DILT-XR 240 MG 24 hr capsule [id number] No Take 1 capsule (240 mg total) by mouth daily.  Patient not taking: Reported on 04/19/2022   Sueanne Margarita, MD Not Taking Active   EPINEPHrine (EPIPEN 2-PAK) 0.3 mg/0.3 mL IJ SOAJ injection [id number]  USE AS DIRECTED FOR SEVERE ALLERGIC REACTION. Shelda Pal, DO  Active Self, Spouse/Significant Other  ezetimibe (ZETIA) 10 MG tablet [id number] Yes Take 1 tablet (10 mg total) by mouth daily. Shelda Pal, DO Taking Active Self, Spouse/Significant Other  fluconazole (DIFLUCAN) 150 MG tablet [id number] No Take 1 tab, repeat in 72 hours if no improvement.  Patient not taking: Reported on 04/07/2022   Shelda Pal, DO Not Taking Active   fluticasone [facility]) 50 MCG/ACT nasal spray [id number] Yes USE 2 SPRAY(S) IN EACH NOSTRIL ONCE DAILY AS NEEDED FOR ALLERGIES OR  RHINITIS Shelda Pal, DO Taking Active   furosemide (LASIX) 20 MG tablet [id number] Yes Take 2 tablets (40 mg total) by mouth daily. Leanor Kail, PA Taking Active   gabapentin (NEURONTIN) 400 MG capsule [id number] Yes TAKE 1 CAPSULE BY MOUTH IN THE MORNING AND 1 AT NOON AND 2  AT BEDTIME Shelda Pal, DO Taking Active   GLUCOMANNAN PO [id number] Yes Take 1,995 mg by mouth in the morning, at noon, and at bedtime. [provider] Taking Active Self, Spouse/Significant Other  glucose blood test strip [id number] Yes 1 each by Other route 4 (four) times daily. Contour Next strips Love, Ivan Anchors, PA-C Taking Active Self, Spouse/Significant Other  guaiFENesin (MUCINEX) 600 MG 12 hr tablet [id number] Yes Take by mouth 2 (two) times daily as needed. [provider] Taking Active   hydrocortisone cream 1 % [id number] Yes Apply 1 application topically 3 (three) times daily as needed for itching (minor skin irritation). Bary Leriche, PA-C Taking Active Self, Spouse/Significant Other  hydroxychloroquine (PLAQUENIL) 200 MG tablet [id number] Yes Take 1 tablet (200 mg total) by mouth 2 (two) times daily. Shelda Pal, DO Taking Active   insulin regular human CONCENTRATED (HUMULIN R) 500 UNIT/ML injection [id number] Yes INJECT 0.08-0.3 MLS (40-150 UNITS TOTAL) INTO THE SKIN THREE TIMES DAILY WITH MEALS Wendling, Crosby Oyster, DO Taking Active   INSULIN SYRINGE 1CC/29G (B-D INSULIN SYRINGE) 29G X 1/2" 1 ML MISC [id number] Yes Use three times daily with insulin.  DX E11.9 Shelda Pal, DO Taking Active Self, Spouse/Significant Other  isosorbide mononitrate (IMDUR) 30 MG 24 hr tablet [id number] Yes Take 1 tablet (30 mg total) by mouth daily. Sueanne Margarita, MD Taking Active Self, Spouse/Significant Other  levocetirizine (XYZAL) 5 MG tablet [id number] Yes TAKE 1 TABLET BY MOUTH ONCE DAILY IN THE EVENING Wendling, Crosby Oyster, DO Taking Active   levothyroxine (SYNTHROID) 200 MCG  tablet [id number] Yes TAKE 1 TABLET BY MOUTH ONCE DAILY BEFORE BREAKFAST Shelda Pal, DO Taking Active   magnesium oxide (MAG-OX) 400 MG tablet [id number] Yes Take 400 mg by mouth daily as needed (cramps). [provider] Taking Active   metFORMIN  (GLUCOPHAGE-XR) 500 MG 24 hr tablet [id number] Yes Take 2 tablets by mouth twice daily Shelda Pal, DO Taking Active   methocarbamol (ROBAXIN) 750 MG tablet [id number] Yes Take 1 tablet (750 mg total) by mouth every 8 (eight) hours as needed for muscle spasms. Domenic Polite, MD Taking Active   mometasone-formoterol [facility]) 100-5 MCG/ACT Hollie Salk [id number] Yes Inhale 2 puffs into the lungs 2 (two) times daily. [provider] Taking Active Self           Med Note Antony Contras, Kendric Sindelar B   Wed Apr 19, 2022  3:54 PM) Will start Advair when she finishes current supply of Dulera.  Multiple Vitamins-Minerals (WOMENS 50+ MULTI VITAMIN PO) [id number] Yes Take 1 tablet by mouth daily. [provider] Taking Active   mupirocin ointment (BACTROBAN) 2 % [id number] Yes Apply 1 application topically 2 (two) times daily as needed (wound care). Shelda Pal, DO Taking Active Self, Spouse/Significant Other  naloxone [facility]) nasal spray 4 mg/0.1 mL [id number] Yes Place 1 spray into the nose as needed (opoid overdose). [provider] Taking Active Self, Spouse/Significant Other  nitroGLYCERIN (NITROSTAT) 0.4 MG SL tablet [id number] Yes DISSOLVE ONE TABLET UNDER THE TONGUE EVERY 5 MINUTES AS NEEDED FOR CHEST PAIN.  DO NOT EXCEED A TOTAL OF 3 DOSES IN 15 MINUTES Turner, Eber Hong, MD Taking Active Self, Spouse/Significant Other  nystatin (MYCOSTATIN/NYSTOP) powder [id number] Yes Apply 1 Application topically 3 (three) times daily. RLE Shelda Pal, DO Taking Active   omeprazole (PRILOSEC) 40 MG capsule [id number] Yes Take 1 capsule by mouth once daily Shelda Pal, DO Taking Active   [place] II [id number] Yes 2 (two) times a week. Zinc Wraps for legs [provider] Taking Active   Oxycodone HCl 20 MG TABS [id number] Yes Take 1 tablet (20 mg total) by mouth every 8 (eight) hours as needed. Bayard Hugger, NP Taking Active   oxymetazoline  (AFRIN) 0.05 % nasal spray [id number]  Place 2 sprays into both nostrils 2 (two) times daily as needed. [provider]  Active Self, Spouse/Significant Other  Polyethyl Glycol-Propyl Glycol (SYSTANE) 0.4-0.3 % SOLN [id number]  Place 1-2 drops into both eyes in the morning. [provider]  Active Self, Spouse/Significant Other  pramipexole (MIRAPEX) 1 MG tablet [id number] Yes TAKE 1 TABLET BY MOUTH THREE TIMES DAILY AS NEEDED AND TAKE 1/2 (ONE-HALF) TWICE DAILY AS NEEDED FOR  BREAKTHROUGH  RESTLESS  LEG Shelda Pal, DO Taking Active   pregabalin (LYRICA) 50 MG capsule [id number] Yes Take 1 capsule (50 mg total) by mouth 3 (three) times daily. Izora Ribas, MD Taking Active   promethazine (PHENERGAN) 25 MG tablet [id number] Yes Take 1 tablet (25 mg total) by mouth every 8 (eight) hours as needed for vomiting or nausea. Shelda Pal, DO Taking Active Self, Spouse/Significant Other  saccharomyces boulardii (FLORASTOR) 250 MG capsule [id number] Yes Take 250 mg by mouth 3 (three) times daily. [provider] Taking Active Self, Spouse/Significant Other           Med Note Barbaraann Boys Jan 30, 2022  3:40 PM) Only takes when she takes antibiotic  triamcinolone cream (KENALOG) 0.1 % [id number] Yes Apply 1  Application topically 3 (three) times daily. RLE Shelda Pal, DO Taking Active   Vitamin D, Ergocalciferol, (DRISDOL) 1.25 MG (50000 UNIT) CAPS capsule [id number] Yes TAKE 1 CAPSULE BY MOUTH ONCE A WEEK -TAKE  ON  FRIDAY Shelda Pal, DO Taking Active   ZINC OXIDE, TOPICAL, 10 % CREA [id number] Yes Apply topically. [provider] Taking Active   ZINC SULFATE PO [id number] No Take 255 mg by mouth daily.  Patient not taking: Reported on 04/19/2022   [provider] Not Taking Active             Patient Active Problem List   Diagnosis Date Noted   Sacral decubitus ulcer, stage II ([place]) 12/24/2021   Class 3  obesity ([place]) 12/23/2021   Iron deficiency anemia 12/23/2021   Acute on chronic diastolic CHF (congestive heart failure) ([place]) 12/22/2021   Hyperkalemia 12/22/2021   Left leg cellulitis 12/20/2021   Peripheral vascular disease, unspecified ([place]) 10/10/2021   Unilateral AKA, right ([place]) 10/10/2021   Hx of AKA (above knee amputation), right ([place]) 07/22/2021   Dehiscence of amputation stump (H[facility])    Acute blood loss anemia 06/24/2021   Postoperative wound infection 06/24/2021   UTI (urinary tract infection) 06/24/2021   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 ([place])    Bacteremia due to Enterococcus 05/28/2021   Sepsis ([place]) 05/26/2021   Digestive disorder 05/18/2021   Bilateral lower extremity edema 05/02/2021   Statin myopathy 03/04/2021   Aortic stenosis 03/04/2021   Sjogren's syndrome ([place]) 02/11/2020   Cutaneous abscess of right foot    Statin intolerance 08/16/2019   Gram-negative bacteremia 10/18/2018   Streptococcal bacteremia 10/18/2018   CAD S/P percutaneous coronary angioplasty 04/19/2018   Essential hypertension    Moderate persistent asthma 03/21/2018   NAFLD (nonalcoholic fatty liver disease)    Recurrent cellulitis of lower extremity 01/02/2018   Cellulitis of lower leg 01/02/2018   Chronic diarrhea 05/08/2017   H/O Clostridium difficile infection 05/08/2017   Rectal bleeding 05/08/2017   Hyperbilirubinemia 04/29/2016   Hyponatremia 04/29/2016   Cellulitis of right foot 03/09/2016   Allergic rhinitis due to pollen 02/15/2016   Anaphylactic reaction due to food 02/10/2016   Type 2 diabetes mellitus with hyperglycemia, with long-term current use of insulin ([place]) 02/10/2016   Gastroesophageal reflux disease without esophagitis 02/10/2016   Atopic eczema 02/10/2016   Cervical nerve root disorder 12/15/2015   Lumbar radiculopathy 12/15/2015   Daytime somnolence 11/26/2015   Venous stasis dermatitis of both lower extremities 02/11/2015    Morbid obesity ([place]) 06/19/2014   Abnormal LFTs 03/26/2014   B-complex deficiency 03/26/2014   Benign essential HTN 03/26/2014   Chronic pain associated with significant psychosocial dysfunction 03/26/2014   Diaphragmatic hernia 03/26/2014   Gastroesophageal reflux disease 03/26/2014   Hammer toe 03/26/2014   H/O neoplasm 03/26/2014   Adaptive colitis 03/26/2014   Deafness, sensorineural 03/26/2014   Fibromyalgia 01/20/2014   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/20/2014   Hyperlipidemia 11/11/2012   Mixed connective tissue disease ([place]) 11/11/2012   Hypothyroidism 36/08/2448   Uncomplicated asthma 75/30/0511   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[place]) 02/20/2012   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     Medication Assistance: Application for Humulin R and possible Symbicort (she will retry if approved for patient assistance program)   medication assistance program. in process.  Anticipated assistance start date 12/24/2021.  See plan of care for additional detail.  Patient's preferred pharmacy is:  [place], [place] - [address] [place] [address] Phone: [phone] Fax: [phone]   Follow Up:  Patient agrees to Care Plan and Follow-up.  Plan: Telephone follow up appointment with care management team member scheduled for:  2 to 3 months   Cherre Robins, PharmD Clinical Pharmacist [place] [facility]

## 2022-04-20 ENCOUNTER — Telehealth: Payer: Self-pay | Admitting: Cardiology

## 2022-04-20 NOTE — Telephone Encounter (Signed)
Pt would like to know if appt on 04/25/22 can be a video appt or does she have to come in the office. Please advise

## 2022-04-23 NOTE — Progress Notes (Deleted)
Office Visit Note  Patient: [name]             Date of Birth: 1961/08/18           MRN: [id number]             PCP: Shelda Pal, DO Referring: Shelda Pal* Visit Date: 05/05/2022 Occupation: @GUAROCC @  Subjective:  No chief complaint on file.   History of Present Illness: [name] is a 61 y.o. female ***   Activities of Daily Living:  Patient reports morning stiffness for *** {minute/hour:19697}.   Patient {ACTIONS;DENIES/REPORTS:21021675::"Denies"} nocturnal pain.  Difficulty dressing/grooming: {ACTIONS;DENIES/REPORTS:21021675::"Denies"} Difficulty climbing stairs: {ACTIONS;DENIES/REPORTS:21021675::"Denies"} Difficulty getting out of chair: {ACTIONS;DENIES/REPORTS:21021675::"Denies"} Difficulty using hands for taps, buttons, cutlery, and/or writing: {ACTIONS;DENIES/REPORTS:21021675::"Denies"}  No Rheumatology ROS completed.   PMFS History:  Patient Active Problem List   Diagnosis Date Noted   Sacral decubitus ulcer, stage II ([place]) 12/24/2021   Class 3 obesity ([place]) 12/23/2021   Iron deficiency anemia 12/23/2021   Acute on chronic diastolic CHF (congestive heart failure) ([place]) 12/22/2021   Hyperkalemia 12/22/2021   Left leg cellulitis 12/20/2021   Peripheral vascular disease, unspecified ([place]) 10/10/2021   Unilateral AKA, right ([place]) 10/10/2021   Hx of AKA (above knee amputation), right ([place]) 07/22/2021   Dehiscence of amputation stump (HCC)    Acute blood loss anemia 06/24/2021   Postoperative wound infection 06/24/2021   UTI (urinary tract infection) 06/24/2021   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 ([place])    Bacteremia due to Enterococcus 05/28/2021   Sepsis ([place]) 05/26/2021   Digestive disorder 05/18/2021   Bilateral lower extremity edema 05/02/2021   Statin myopathy 03/04/2021   Aortic stenosis 03/04/2021   Sjogren's syndrome ([place]) 02/11/2020   Cutaneous abscess of right foot    Statin  intolerance 08/16/2019   Gram-negative bacteremia 10/18/2018   Streptococcal bacteremia 10/18/2018   CAD S/P percutaneous coronary angioplasty 04/19/2018   Essential hypertension    Moderate persistent asthma 03/21/2018   NAFLD (nonalcoholic fatty liver disease)    Recurrent cellulitis of lower extremity 01/02/2018   Cellulitis of lower leg 01/02/2018   Chronic diarrhea 05/08/2017   H/O Clostridium difficile infection 05/08/2017   Rectal bleeding 05/08/2017   Hyperbilirubinemia 04/29/2016   Hyponatremia 04/29/2016   Cellulitis of right foot 03/09/2016   Allergic rhinitis due to pollen 02/15/2016   Anaphylactic reaction due to food 02/10/2016   Type 2 diabetes mellitus with hyperglycemia, with long-term current use of insulin ([place]) 02/10/2016   Gastroesophageal reflux disease without esophagitis 02/10/2016   Atopic eczema 02/10/2016   Cervical nerve root disorder 12/15/2015   Lumbar radiculopathy 12/15/2015   Daytime somnolence 11/26/2015   Venous stasis dermatitis of both lower extremities 02/11/2015   Morbid obesity ([place]) 06/19/2014   Abnormal LFTs 03/26/2014   B-complex deficiency 03/26/2014   Benign essential HTN 03/26/2014   Chronic pain associated with significant psychosocial dysfunction 03/26/2014   Diaphragmatic hernia 03/26/2014   Gastroesophageal reflux disease 03/26/2014   Hammer toe 03/26/2014   H/O neoplasm 03/26/2014   Adaptive colitis 03/26/2014   Deafness, sensorineural 03/26/2014   Fibromyalgia 01/20/2014   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/20/2014   Hyperlipidemia 11/11/2012   Mixed connective tissue disease ([place]) 11/11/2012   Hypothyroidism 18/84/1660   Uncomplicated asthma 63/09/6008   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[place]) 02/20/2012   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     Eczema    Excessive daytime sleepiness 11/26/2015   Fibromyalgia    Gallstones    GERD (gastroesophageal reflux disease)    Heart murmur    "noted for the 1st time on 04/18/2018"   History of blood transfusion 07/2010   "S/P oophorectomy"   History of gout    History of hiatal hernia 1980s   "gone now" (04/18/2018)   Hyperlipidemia    Hypertension    takes Metoprolol and Enalapril daily   Hypothyroidism    takes Synthroid daily   IBS (irritable bowel syndrome)    Migraine    "nothing in the 2000s" (04/18/2018)   Mixed connective tissue disease ([place])    NAFLD (nonalcoholic fatty liver disease)    Pneumonia    "several times" (04/18/2018)   PVC's (premature ventricular contractions)    noted on event monitor 02/2021   Rheumatoid arthritis ([place])    "hands, elbows, shoulders, probably knees" (04/18/2018)   Scoliosis    Sjogren's syndrome ([place])    Sleep apnea    mild - does not use cpap   Spondylosis    Type II diabetes mellitus ([place])    takes Metformin and Hum R daily (04/18/2018)   Walker as ambulation aid    also uses wheelchair    Family History  Problem Relation Age of Onset   CAD Mother    Hypertension Mother    Heart attack Mother    Stroke Mother    CAD Father    Heart attack Father    Allergic rhinitis Father    Asthma Father    Hypertension Brother    Hypertension Brother    Pancreatic cancer Paternal Aunt    Breast cancer Paternal Aunt    Lung cancer Paternal Aunt    Allergic rhinitis Son    Asthma Son    Past Surgical History:  Procedure Laterality Date    ABDOMINAL HYSTERECTOMY  06/2005   "w/right ovariy"   AMPUTATION Right 01/28/2020    RIGHT FOURTH AND FIFTH RAY AMPUTATION   AMPUTATION Right 01/28/2020   Procedure: RIGHT FOURTH AND FIFTH RAY AMPUTATION,;  Surgeon: Newt Minion, MD;  Location: [facility];  Service: Orthopedics;  Laterality: Right;   AMPUTATION Right 06/01/2021   Procedure: RIGHT BELOW KNEE AMPUTATION;  Surgeon: Newt Minion, MD;  Location: [facility];  Service: Orthopedics;  Laterality: Right;   AMPUTATION Right 07/22/2021   Procedure: RIGHT ABOVE KNEE AMPUTATION;  Surgeon: Newt Minion, MD;  Location: [facility];  Service: Orthopedics;  Laterality: Right;   APPENDECTOMY     BREAST BIOPSY Bilateral    9 total (04/18/2018)   CORONARY ANGIOPLASTY WITH STENT PLACEMENT  10/01/2015   normal LM, 30% LAD, 85% mid RCA and 95% distal RCA s/p PCI of the mid to distal RCA and now on DAPT with ASA and Ticagrelor.     CORONARY STENT  INTERVENTION Right 04/18/2018   Procedure: CORONARY STENT INTERVENTION;  Surgeon: Burnell Blanks, MD;  Location: [facility];  Service: Cardiovascular;  Laterality: Right;   DILATION AND CURETTAGE OF UTERUS     FRACTURE SURGERY     LAPAROSCOPIC CHOLECYSTECTOMY     LEFT HEART CATH AND CORONARY ANGIOGRAPHY N/A 04/18/2018   Procedure: LEFT HEART CATH AND CORONARY ANGIOGRAPHY;  Surgeon: Burnell Blanks, MD;  Location: [facility];  Service: Cardiovascular;  Laterality: N/A;   MUSCLE BIOPSY Left    "leg"   OOPHORECTOMY  07/2010   RADIOLOGY WITH ANESTHESIA N/A 05/25/2016   Procedure: RADIOLOGY WITH ANESTHESIA;  Surgeon: Medication Radiologist, MD;  Location: [facility];  Service: Radiology;  Laterality: N/A;   STUMP REVISION Right 06/29/2021   Procedure: REVISION RIGHT BELOW KNEE AMPUTATION;  Surgeon: Newt Minion, MD;  Location: [facility];  Service: Orthopedics;  Laterality: Right;   TEE WITHOUT CARDIOVERSION N/A 06/01/2021   Procedure: TRANSESOPHAGEAL ECHOCARDIOGRAM (TEE);  Surgeon: Geralynn Rile, MD;  Location: [facility];  Service: Cardiovascular;  Laterality: N/A;   TONSILLECTOMY AND ADENOIDECTOMY     WRIST FRACTURE SURGERY Left    "crushed it"   Social History   Social History Narrative   Not on file   Immunization History  Administered Date(s) Administered   Influenza,inj,Quad PF,6+ Mos 06/30/2015   Pneumococcal Polysaccharide-23 02/25/2014     Objective: Vital Signs: There were no vitals taken for this visit.   Physical Exam   Musculoskeletal Exam: ***  CDAI Exam: CDAI Score: -- Patient Global: --; Provider Global: -- Swollen: --; Tender: -- Joint Exam 05/05/2022   No joint exam has been documented for this visit   There is currently no information documented on the homunculus. Go to the Rheumatology activity and complete the homunculus joint exam.  Investigation: No additional findings.  Imaging: No results found.  Recent Labs: Lab Results  Component Value Date   WBC 8.5 04/07/2022   HGB 9.7 (L) 04/07/2022   PLT 326 04/07/2022   NA 140 04/07/2022   K 3.8 04/07/2022   CL 100 04/07/2022   CO2 28 04/07/2022   GLUCOSE 195 (H) 04/07/2022   BUN 10 04/07/2022   CREATININE 0.78 04/07/2022   BILITOT 0.3 04/07/2022   ALKPHOS 87 12/23/2021   AST 55 (H) 04/07/2022   ALT 45 (H) 04/07/2022   PROT 6.7 04/07/2022   ALBUMIN 3.0 (L) 12/23/2021   CALCIUM 8.9 04/07/2022   GFRAA 87 05/04/2020   April 07, 2022 ANA 1: 640NS, RNP 4.4, (SCL 70, [place], SSA, SSB, dsDNA, beta-2 GP 1, anticardiolipin, lupus anticoagulant negative), C3-C4 normal, ESR 31, RF negative, anti-CCP negative  Speciality Comments: No specialty comments available.  Procedures:  No procedures performed Allergies: Fish allergy, Fish oil, Omega-3 fatty acids, Crestor [rosuvastatin], Gabapentin, Lovastatin, Metronidazole, Red yeast rice [cholestin], Rotigotine, Atrovent hfa [ipratropium bromide hfa], Iodine, Other, Pepto-bismol [bismuth subsalicylate], Victoza [liraglutide], Enalapril, Suprep  [na sulfate-k sulfate-mg sulf], and Tape   Assessment / Plan:     Visit Diagnoses: No diagnosis found.  Orders: No orders of the defined types were placed in this encounter.  No orders of the defined types were placed in this encounter.   Face-to-face time spent with patient was *** minutes. Greater than 50% of time was spent in counseling and coordination of care.  Follow-Up Instructions: No follow-ups on file.   Bo Merino, MD  Note - This record has been created using Editor, commissioning.  Chart creation errors  have been sought, but may not always  have been located. Such creation errors do not reflect on  the standard of medical care.

## 2022-04-24 NOTE — Patient Instructions (Addendum)
[name]  It was a pleasure speaking with you today.  Below is a summary of your health goals and summary of our recent visit. You can also view your updated Chronic Care Management Care plan through your MyChart account.   Patient Goals/[name] Activities take medications as prescribed,  check glucose 3 to 4 times per day, document, and provide at future appointments,  collaborate with provider on medication access solutions, and  Go to [email or web address] - to get discount care for [place].   Follow Up Plan: Telephone follow up appointment with care management team member scheduled for:  2 to 3 months     As always if you have any questions or concerns especially regarding medications, please feel free to contact me either at the phone number below or with a MyChart message.   Keep up the good work!  Henrene Pastor, PharmD Clinical Pharmacist [facility] [phone] (direct line)  [phone] (main office number)

## 2022-04-25 ENCOUNTER — Other Ambulatory Visit: Payer: Self-pay

## 2022-04-25 ENCOUNTER — Encounter: Payer: Self-pay | Admitting: Cardiology

## 2022-04-25 ENCOUNTER — Other Ambulatory Visit: Payer: Self-pay | Admitting: Family Medicine

## 2022-04-25 ENCOUNTER — Telehealth (INDEPENDENT_AMBULATORY_CARE_PROVIDER_SITE_OTHER): Payer: 59 | Admitting: Cardiology

## 2022-04-25 VITALS — BP 129/63 | HR 94 | Temp 97.6°F | Ht 62.0 in | Wt 299.0 lb

## 2022-04-25 DIAGNOSIS — E785 Hyperlipidemia, unspecified: Secondary | ICD-10-CM

## 2022-04-25 DIAGNOSIS — I480 Paroxysmal atrial fibrillation: Secondary | ICD-10-CM

## 2022-04-25 DIAGNOSIS — I10 Essential (primary) hypertension: Secondary | ICD-10-CM

## 2022-04-25 DIAGNOSIS — M351 Other overlap syndromes: Secondary | ICD-10-CM

## 2022-04-25 DIAGNOSIS — R0609 Other forms of dyspnea: Secondary | ICD-10-CM

## 2022-04-25 DIAGNOSIS — I2510 Atherosclerotic heart disease of native coronary artery without angina pectoris: Secondary | ICD-10-CM

## 2022-04-25 MED ORDER — ASPIRIN 81 MG PO TBEC: 81.0000 mg | DELAYED_RELEASE_TABLET | Freq: Every day | ORAL | 1 refills | Status: DC

## 2022-04-25 NOTE — Patient Instructions (Signed)
Medication Instructions:  Your physician recommends that you continue on your current medications as directed. Please refer to the Current Medication list given to you today.  *If you need a refill on your cardiac medications before your next appointment, please call your pharmacy*   Lab Work: Fasting Lipids and ALT at PCP If you have labs (blood work) drawn today and your tests are completely normal, you will receive your results only by: MyChart Message (if you have MyChart) OR A paper copy in the mail If you have any lab test that is abnormal or we need to change your treatment, we will call you to review the results.  Follow-Up: At [facility], you and your health needs are our priority.  As part of our continuing mission to provide you with exceptional heart care, we have created designated Provider Care Teams.  These Care Teams include your primary Cardiologist (physician) and Advanced Practice Providers (APPs -  Physician Assistants and Nurse Practitioners) who all work together to provide you with the care you need, when you need it.  You have been referred back to the [facility]  Your next appointment:   1 year(s)  The format for your next appointment:   In Person  Provider:   Armanda Magic, MD      Important Information About Sugar

## 2022-04-25 NOTE — Addendum Note (Signed)
Addended by: Theresia Majors on: 04/25/2022 10:56 AM   Modules accepted: Orders

## 2022-04-25 NOTE — Progress Notes (Signed)
Virtual Visit via Video Note   Because of [name]'s co-morbid illnesses, she is at least at moderate risk for complications without adequate follow up.  This format is felt to be most appropriate for this patient at this time.  All issues noted in this document were discussed and addressed.  A limited physical exam was performed with this format.  Please refer to the patient's chart for her consent to telehealth for [facility].  Date:  04/25/2022   ID:  Miachel Roux [name], DOB [birth year], MRN [id number] The patient was identified using 2 identifiers.  Patient Location: Home Provider Location: Office/Clinic   PCP:  Sharlene Dory, DO   [facility] Providers Cardiologist:  Armanda Magic, MD     Evaluation Performed:  Follow-Up Visit  Chief Complaint:  PAF, CAD, HTN, HLD, DOE  History of Present Illness:    [name] is a 61 y.o. female with a hx of ASCAD with prior cath showing normal LM, 30% LAD, 85% mid RCA and 95% distal RCA s/p PCI of the mid to distal RCA. 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   Repeat cath for CP and heartburn 04/19/18 showed severe stenosis of the ostial RCA, 99% stenosed, treated with successful PCI + DES placement. The previously placed stents in the mid and distal RCA were patent with only minimal stenosis. The LAD had only mild, 20%, disease. She was placed on DAPT w/ ASA and Plavix. Also continued on a BB and ARB.  She was referred to lipid clinic for consideration of PCSK9 inhibitor but never followed through.     She also has a hx  of A. fib with RVR in the setting of sepsis and bacteremia and converted to NSR and has not had any further A.  fib.  She was on Lopressor but no anticoagulation.  Her CHADS2VASC score was 5 but due to the very brief nature of her A. fib in the setting of acute infection and respiratory failure it was recommended to hold off on anticoagulation unless she had further episodes of atrial fibrillation.  Her BB was changed to Cardizem due to wheezing and asthma .  Admitted 12/2021 with a/c CHF w/ volume overload in the setting of medication noncompliance and Lt LE cellulitis. Diuresed with IV lasix and treated w/ IV abx. Transitioned to lasix 40 mg bid. Valsartan was discontinued due to hyperkalemia but she was continued on spironolactone 25 mg daily. D/c Wt 283 lb.    Seen in [facility] clinic on 4/24. Labs showed bump in SCr, from 0.80>>1.32. BNP was normal. Lasix reduced to once daily.    Had f/u w/ PCP on 4/28. F/u BMP showed further increase in SCr to 1.46. K was elevated at 5.2 (no hemolysis). PCP added Farxiga 10 mg daily.    Last seen in May 2023 by heart failure clinic and was doing well.  She was continued on Lasix 40 mg daily and Farxiga 10 mg daily as a Spiro 25 mg daily.  She is here today for followup and is doing well from a cardiac standpoint.  She has had a flare of her RA recently that is affecting her rehab.  She denies any chest pain or pressure, PND, orthopnea, LE  edema, dizziness or syncope. She has asthma and sometimes has DOE but is related to her asthma and is very stable. She has some fluttering in her chest but only if she gets anxious. She is compliant with her meds and is tolerating meds with no SE.     Past Medical History:  Diagnosis Date   Aortic stenosis    mild AS by echo 02/2021   Asthma    "on daily RX and rescue inhaler" (04/18/2018)   Chronic diastolic CHF (congestive heart failure) (HCC) 09/2015   Chronic lower back pain    Chronic neck pain    Chronic pain syndrome    Fentanyl Patch   Colon polyps    Coronary artery disease    cath with normal LM, 30% LAD, 85% mid RCA and  95% distal RCA s/p PCI of the mid to distal RCA and now on DAPT with ASA and Ticagrelor.     Eczema    Excessive daytime sleepiness 11/26/2015   Fibromyalgia    Gallstones    GERD (gastroesophageal reflux disease)    Heart murmur    "noted for the 1st time on 04/18/2018"   History of blood transfusion 07/2010   "S/P oophorectomy"   History of gout    History of hiatal hernia 1980s   "gone now" (04/18/2018)   Hyperlipidemia    Hypertension    takes Metoprolol and Enalapril daily   Hypothyroidism    takes Synthroid daily   IBS (irritable bowel syndrome)    Migraine    "nothing in the 2000s" (04/18/2018)   Mixed connective tissue disease (HCC)    NAFLD (nonalcoholic fatty liver disease)    Pneumonia    "several times" (04/18/2018)   PVC's (premature ventricular contractions)    noted on event monitor 02/2021   Rheumatoid arthritis (HCC)    "hands, elbows, shoulders, probably knees" (04/18/2018)   Scoliosis    Sjogren's syndrome (HCC)    Sleep apnea    mild - does not use cpap   Spondylosis    Type II diabetes mellitus (HCC)    takes Metformin and Hum R daily (04/18/2018)   Walker as ambulation aid    also uses wheelchair   Past Surgical History:  Procedure Laterality Date   ABDOMINAL HYSTERECTOMY  06/2005   "w/right ovariy"   AMPUTATION Right 01/28/2020    RIGHT FOURTH AND FIFTH RAY AMPUTATION   AMPUTATION Right 01/28/2020   Procedure: RIGHT FOURTH AND FIFTH RAY AMPUTATION,;  Surgeon: [name], MD;  Location: [facility] OR;  Service: Orthopedics;  Laterality: Right;   AMPUTATION Right 06/01/2021   Procedure: RIGHT BELOW KNEE AMPUTATION;  Surgeon: [name], MD;  Location: [facility] OR;  Service: Orthopedics;  Laterality: Right;   AMPUTATION Right 07/22/2021   Procedure: RIGHT ABOVE KNEE AMPUTATION;  Surgeon: [name], MD;  Location: [facility] OR;  Service: Orthopedics;  Laterality: Right;   APPENDECTOMY     BREAST BIOPSY Bilateral    9 total (04/18/2018)   CORONARY ANGIOPLASTY WITH STENT  PLACEMENT  10/01/2015   normal LM, 30% LAD, 85% mid RCA and 95% distal RCA s/p PCI of the mid to distal RCA and now on DAPT with ASA and Ticagrelor.     CORONARY STENT INTERVENTION Right 04/18/2018   Procedure: CORONARY STENT INTERVENTION;  Surgeon: [name], MD;  Location: [facility] INVASIVE CV LAB;  Service: Cardiovascular;  Laterality: Right;   DILATION AND CURETTAGE OF UTERUS     FRACTURE SURGERY  LAPAROSCOPIC CHOLECYSTECTOMY     LEFT HEART CATH AND CORONARY ANGIOGRAPHY N/A 04/18/2018   Procedure: LEFT HEART CATH AND CORONARY ANGIOGRAPHY;  Surgeon: [name], MD;  Location: [facility] INVASIVE CV LAB;  Service: Cardiovascular;  Laterality: N/A;   MUSCLE BIOPSY Left    "leg"   OOPHORECTOMY  07/2010   RADIOLOGY WITH ANESTHESIA N/A 05/25/2016   Procedure: RADIOLOGY WITH ANESTHESIA;  Surgeon: Medication Radiologist, MD;  Location: [facility] OR;  Service: Radiology;  Laterality: N/A;   STUMP REVISION Right 06/29/2021   Procedure: REVISION RIGHT BELOW KNEE AMPUTATION;  Surgeon: [name], MD;  Location: [facility] OR;  Service: Orthopedics;  Laterality: Right;   TEE WITHOUT CARDIOVERSION N/A 06/01/2021   Procedure: TRANSESOPHAGEAL ECHOCARDIOGRAM (TEE);  Surgeon: Sande Rives, MD;  Location: [facility] OR;  Service: Cardiovascular;  Laterality: N/A;   TONSILLECTOMY AND ADENOIDECTOMY     WRIST FRACTURE SURGERY Left    "crushed it"     Current Meds  Medication Sig   acetaminophen (TYLENOL) 500 MG tablet Take 500 mg by mouth every 8 (eight) hours as needed.   albuterol (PROAIR HFA) 108 (90 Base) MCG/ACT inhaler Inhale 2 puffs into the lungs every 4 (four) hours as needed for wheezing or shortness of breath.   albuterol (PROVENTIL) (2.5 MG/3ML) 0.083% nebulizer solution USE 1 VIAL IN NEBULIZER EVERY 4 HOURS AS NEEDED FOR WHEEZING FOR SHORTNESS OF BREATH   ascorbic acid (VITAMIN C) 500 MG tablet Take 1 tablet (500 mg total) by mouth 2 (two) times daily.   aspirin EC 81 MG tablet Take 1 tablet  (81 mg total) by mouth every evening.   augmented betamethasone dipropionate (DIPROLENE AF) 0.05 % cream Apply topically 2 (two) times daily.   cyclobenzaprine (FLEXERIL) 10 MG tablet Take 10 mg by mouth 3 (three) times daily as needed for muscle spasms.   dapagliflozin propanediol (FARXIGA) 10 MG TABS tablet Take 1 tablet (10 mg total) by mouth daily before breakfast.   diclofenac Sodium (VOLTAREN) 1 % GEL Apply 2 g topically 4 (four) times daily.   DILT-XR 240 MG 24 hr capsule Take 1 capsule (240 mg total) by mouth daily.   EPINEPHrine (EPIPEN 2-PAK) 0.3 mg/0.3 mL IJ SOAJ injection USE AS DIRECTED FOR SEVERE ALLERGIC REACTION.   ezetimibe (ZETIA) 10 MG tablet Take 1 tablet (10 mg total) by mouth daily.   fluticasone (FLONASE) 50 MCG/ACT nasal spray USE 2 SPRAY(S) IN EACH NOSTRIL ONCE DAILY AS NEEDED FOR ALLERGIES OR  RHINITIS   furosemide (LASIX) 20 MG tablet Take 2 tablets (40 mg total) by mouth daily.   gabapentin (NEURONTIN) 400 MG capsule TAKE 1 CAPSULE BY MOUTH IN THE MORNING AND 1 AT NOON AND 2 AT BEDTIME   GLUCOMANNAN PO Take 1,995 mg by mouth in the morning, at noon, and at bedtime.   glucose blood test strip 1 each by Other route 4 (four) times daily. Contour Next strips   guaiFENesin (MUCINEX) 600 MG 12 hr tablet Take by mouth 2 (two) times daily as needed.   hydrocortisone cream 1 % Apply 1 application topically 3 (three) times daily as needed for itching (minor skin irritation).   hydroxychloroquine (PLAQUENIL) 200 MG tablet Take 1 tablet (200 mg total) by mouth 2 (two) times daily.   insulin regular human CONCENTRATED (HUMULIN R) 500 UNIT/ML injection INJECT 0.08-0.3 MLS (40-150 UNITS TOTAL) INTO THE SKIN THREE TIMES DAILY WITH MEALS   INSULIN SYRINGE 1CC/29G (B-D INSULIN SYRINGE) 29G X 1/2" 1 ML MISC Use three times  daily with insulin.  DX E11.9   isosorbide mononitrate (IMDUR) 30 MG 24 hr tablet Take 1 tablet (30 mg total) by mouth daily.   levocetirizine (XYZAL) 5 MG tablet TAKE  1 TABLET BY MOUTH ONCE DAILY IN THE EVENING   levothyroxine (SYNTHROID) 200 MCG tablet TAKE 1 TABLET BY MOUTH ONCE DAILY BEFORE BREAKFAST   metFORMIN (GLUCOPHAGE-XR) 500 MG 24 hr tablet Take 2 tablets by mouth twice daily   methocarbamol (ROBAXIN) 750 MG tablet Take 1 tablet (750 mg total) by mouth every 8 (eight) hours as needed for muscle spasms.   mometasone-formoterol (DULERA) 100-5 MCG/ACT AERO Inhale 2 puffs into the lungs 2 (two) times daily.   Multiple Vitamins-Minerals (WOMENS 50+ MULTI VITAMIN PO) Take 1 tablet by mouth daily.   mupirocin ointment (BACTROBAN) 2 % Apply 1 application topically 2 (two) times daily as needed (wound care).   naloxone (NARCAN) nasal spray 4 mg/0.1 mL Place 1 spray into the nose as needed (opoid overdose).   nitroGLYCERIN (NITROSTAT) 0.4 MG SL tablet DISSOLVE ONE TABLET UNDER THE TONGUE EVERY 5 MINUTES AS NEEDED FOR CHEST PAIN.  DO NOT EXCEED A TOTAL OF 3 DOSES IN 15 MINUTES   nystatin (MYCOSTATIN/NYSTOP) powder Apply 1 Application topically 3 (three) times daily. RLE   omeprazole (PRILOSEC) 40 MG capsule Take 1 capsule by mouth once daily   OVER THE COUNTER MEDICATION 2 (two) times a week. Zinc Wraps for legs   Oxycodone HCl 20 MG TABS Take 1 tablet (20 mg total) by mouth every 8 (eight) hours as needed.   oxymetazoline (AFRIN) 0.05 % nasal spray Place 2 sprays into both nostrils 2 (two) times daily as needed.   Polyethyl Glycol-Propyl Glycol (SYSTANE) 0.4-0.3 % SOLN Place 1-2 drops into both eyes in the morning.   pramipexole (MIRAPEX) 1 MG tablet TAKE 1 TABLET BY MOUTH THREE TIMES DAILY AS NEEDED AND TAKE 1/2 (ONE-HALF) TWICE DAILY AS NEEDED FOR  BREAKTHROUGH  RESTLESS  LEG   pregabalin (LYRICA) 50 MG capsule Take 1 capsule (50 mg total) by mouth 3 (three) times daily.   promethazine (PHENERGAN) 25 MG tablet Take 1 tablet (25 mg total) by mouth every 8 (eight) hours as needed for vomiting or nausea.   saccharomyces boulardii (FLORASTOR) 250 MG capsule Take  250 mg by mouth 3 (three) times daily.   triamcinolone cream (KENALOG) 0.1 % Apply 1 Application topically 3 (three) times daily. RLE   Vitamin D, Ergocalciferol, (DRISDOL) 1.25 MG (50000 UNIT) CAPS capsule TAKE 1 CAPSULE BY MOUTH ONCE A WEEK -TAKE  ON  FRIDAY   ZINC OXIDE, TOPICAL, 10 % CREA Apply topically.   ZINC SULFATE PO Take 255 mg by mouth daily.     Allergies:   Fish allergy, Fish oil, Omega-3 fatty acids, Crestor [rosuvastatin], Gabapentin, Lovastatin, Metronidazole, Red yeast rice [cholestin], Rotigotine, Atrovent hfa [ipratropium bromide hfa], Iodine, Other, Pepto-bismol [bismuth subsalicylate], Victoza [liraglutide], Enalapril, Suprep [na sulfate-k sulfate-mg sulf], and Tape   Social History   Tobacco Use   Smoking status: Former    Packs/day: 1.00    Years: 19.00    Total pack years: 19.00    Types: Cigarettes    Start date: 52    Quit date: 02/11/2004    Years since quitting: 18.2    Passive exposure: Past   Smokeless tobacco: Never  Vaping Use   Vaping Use: Never used  Substance Use Topics   Alcohol use: Yes    Comment: RARE Wine   Drug use: Not Currently  Types: Marijuana    Comment: "only in my teens"     Family Hx: The patient's family history includes Allergic rhinitis in her father and son; Asthma in her father and son; Breast cancer in her paternal aunt; CAD in her father and mother; Heart attack in her father and mother; Hypertension in her brother, brother, and mother; Lung cancer in her paternal aunt; Pancreatic cancer in her paternal aunt; Stroke in her mother.  ROS:   Please see the history of present illness.     All other systems reviewed and are negative.   Prior CV studies:   The following studies were reviewed today:  none  Labs/Other Tests and Data Reviewed:    EKG:  No ECG reviewed.  Recent Labs: 12/24/2021: Magnesium 1.8 12/26/2021: TSH 9.663 01/09/2022: B Natriuretic Peptide 19.3 04/07/2022: ALT 45; BUN 10; Creat 0.78; Hemoglobin  9.7; Platelets 326; Potassium 3.8; Sodium 140   Recent Lipid Panel Lab Results  Component Value Date/Time   CHOL 194 10/27/2020 08:09 AM   CHOL 176 05/04/2020 09:47 AM   TRIG 223.0 (H) 10/27/2020 08:09 AM   HDL 46.60 10/27/2020 08:09 AM   HDL 41 05/04/2020 09:47 AM   CHOLHDL 4 10/27/2020 08:09 AM   LDLCALC 100 (H) 05/04/2020 09:47 AM   LDLDIRECT 121.0 10/27/2020 08:09 AM    Wt Readings from Last 3 Encounters:  04/25/22 299 lb (135.6 kg)  04/07/22 300 lb (136.1 kg)  03/30/22 (!) 304 lb 6.4 oz (138.1 kg)         Objective:    Vital Signs:  BP 129/63   Pulse 94   Temp 97.6 F (36.4 C)   Ht 5\' 2"  (1.575 m)   Wt 299 lb (135.6 kg)   BMI 54.69 kg/m    VITAL SIGNS:  reviewed GEN:  no acute distress EYES:  sclerae anicteric, EOMI - Extraocular Movements Intact RESPIRATORY:  normal respiratory effort, symmetric expansion CARDIOVASCULAR:  no peripheral edema SKIN:  no rash, lesions or ulcers. MUSCULOSKELETAL:  no obvious deformities. NEURO:  alert and oriented x 3, no obvious focal deficit PSYCH:  normal affect  ASSESSMENT & PLAN:    1.  PAF  -this occurred in the setting of sepsis and bacteremia and has not had any further occurrence.   -She was not placed on anticoagulation event with CHADS2VASC score of 5 since this occurred in the setting of acute illness.  Decision was to start DOAC if she had a reoccurrence.  -Her BB was stopped due to asthma  -Event monitor in July 2022 showed no evidence of A-fib -I continue prescription drug management with diltiazem XR 240 mg daily with as needed refills   2.  ASCAD  - cath 04/2018 showed severe stenosis of the ostial RCA, 99% stenosed, treated with successful PCI + DES placement. The previously placed stents in the mid and distal RCA were patent with only minimal stenosis. The LAD had only mild, 20%, disease. -She has not had any anginal symptoms since I saw her last -Continue prescription drug management with aspirin 81 mg  daily, Imdur 30 mg daily and Zetia 10 mg daily -She is statin intolerant   3.  Hypertension  -BP been adequately controlled at home -Continue drug management Cardizem CD 240 mg daily and spironolactone 25 mg daily with as needed refills -Her last potassium was 3.8 on 04/07/2022   4.  Hyperlipidemia -she is statin intolerant.  -She was referred back to lipid clinic for PCSK 9i but this has  not occurred.  -Repeat FLP and ALT and refer back to lipid clinic  5.  RA with MCTD  -No evidence of pulmonary hypertension on echo 05/2021 -unfortunately she has had a flare of her RA   6.  DOE -I suspect that this is multifactorial from morbid obesity with increased weight gain, asthma, sedentary state, fibromyalgia and deconditioning.  -2D echo 05/31/2021 showed normal LV function with EF 55 to 60% -Lexiscan Myoview 02/2021 showed no ischemia   Time:   Today, I have spent 20 minutes with the patient with telehealth technology discussing the above problems including review of Lexiscan Myoview, 2D echo and TEE done in 2022.     Medication Adjustments/Labs and Tests Ordered: Current medicines are reviewed at length with the patient today.  Concerns regarding medicines are outlined above.   Tests Ordered: No orders of the defined types were placed in this encounter.   Medication Changes: No orders of the defined types were placed in this encounter.   Follow Up:  In Person in 6 month(s)  Signed, Armanda Magic, MD  04/25/2022 10:41 AM    [facility]

## 2022-04-27 ENCOUNTER — Encounter: Payer: 59 | Admitting: Registered Nurse

## 2022-05-01 ENCOUNTER — Encounter: Payer: 59 | Admitting: Registered Nurse

## 2022-05-02 ENCOUNTER — Encounter: Payer: Self-pay | Admitting: Registered Nurse

## 2022-05-02 ENCOUNTER — Encounter: Payer: 59 | Attending: Registered Nurse | Admitting: Registered Nurse

## 2022-05-02 VITALS — BP 127/64 | HR 94 | Ht 62.0 in

## 2022-05-02 DIAGNOSIS — M545 Low back pain, unspecified: Secondary | ICD-10-CM | POA: Insufficient documentation

## 2022-05-02 DIAGNOSIS — M25511 Pain in right shoulder: Secondary | ICD-10-CM | POA: Diagnosis present

## 2022-05-02 DIAGNOSIS — M25512 Pain in left shoulder: Secondary | ICD-10-CM | POA: Diagnosis present

## 2022-05-02 DIAGNOSIS — G894 Chronic pain syndrome: Secondary | ICD-10-CM | POA: Insufficient documentation

## 2022-05-02 DIAGNOSIS — M7061 Trochanteric bursitis, right hip: Secondary | ICD-10-CM | POA: Insufficient documentation

## 2022-05-02 DIAGNOSIS — M7062 Trochanteric bursitis, left hip: Secondary | ICD-10-CM | POA: Insufficient documentation

## 2022-05-02 DIAGNOSIS — Z5181 Encounter for therapeutic drug level monitoring: Secondary | ICD-10-CM | POA: Insufficient documentation

## 2022-05-02 DIAGNOSIS — M2550 Pain in unspecified joint: Secondary | ICD-10-CM | POA: Diagnosis present

## 2022-05-02 DIAGNOSIS — G8929 Other chronic pain: Secondary | ICD-10-CM | POA: Insufficient documentation

## 2022-05-02 DIAGNOSIS — Z79899 Other long term (current) drug therapy: Secondary | ICD-10-CM | POA: Insufficient documentation

## 2022-05-02 MED ORDER — OXYCODONE HCL 20 MG PO TABS: 1.0000 | ORAL_TABLET | Freq: Three times a day (TID) | ORAL | 0 refills | Status: DC | PRN

## 2022-05-02 NOTE — Progress Notes (Unsigned)
Subjective:    Patient ID: [name], female    DOB: 03/22/1961, 61 y.o.   MRN: [id number]  HPI: [name] is a 61 y.o. female who returns for follow up appointment for chronic pain and medication refill. states *** pain is located in  ***. rates pain ***. current exercise regime is walking and performing stretching exercises.  Ms. Kelm Morphine equivalent is *** MME.        Pain Inventory Average Pain 7 Pain Right Now 8 My pain is constant, sharp, burning, stabbing, tingling, and aching  In the last 24 hours, has pain interfered with the following? General activity 1 Relation with others 1 Enjoyment of life 1 What TIME of day is your pain at its worst? night Sleep (in general) Poor  Pain is worse with: walking, bending, sitting, standing, and some activites Pain improves with: rest, medication, and injections Relief from Meds: 5  Family History  Problem Relation Age of Onset   CAD Mother    Hypertension Mother    Heart attack Mother    Stroke Mother    CAD Father    Heart attack Father    Allergic rhinitis Father    Asthma Father    Hypertension Brother    Hypertension Brother    Pancreatic cancer Paternal Aunt    Breast cancer Paternal Aunt    Lung cancer Paternal Aunt    Allergic rhinitis Son    Asthma Son    Social History   Socioeconomic History   Marital status: Married    Spouse name: [name] Elsass   Number of children: 1   Years of education: 11   Highest education level: GED or equivalent  Occupational History    Comment: Not currrently working ( last worked 2004)  Tobacco Use   Smoking status: Former    Packs/day: 1.00    Years: 19.00    Total pack years: 19.00    Types: Cigarettes    Start date: 58    Quit date: 02/11/2004    Years since quitting: 18.2    Passive exposure: Past   Smokeless tobacco: Never  Vaping Use   Vaping Use: Never used  Substance and Sexual Activity   Alcohol use: Yes    Comment: RARE Wine   Drug  use: Not Currently    Types: Marijuana    Comment: "only in my teens"   Sexual activity: Yes    Birth control/protection: Surgical    Comment: Hysterectomy  Other Topics Concern   Not on file  Social History Narrative   Not on file   Social Determinants of Health   Financial Resource Strain: Low Risk  (12/27/2021)   Overall Financial Resource Strain (CARDIA)    Difficulty of Paying Living Expenses: Not hard at all  Food Insecurity: No Food Insecurity (12/27/2021)   Hunger Vital Sign    Worried About Running Out of Food in the Last Year: Never true    Ran Out of Food in the Last Year: Never true  Transportation Needs: No Transportation Needs (08/25/2021)   PRAPARE - Administrator, Civil Service (Medical): No    Lack of Transportation (Non-Medical): No  Recent Concern: Transportation Needs - Unmet Transportation Needs (08/09/2021)   PRAPARE - Transportation    Lack of Transportation (Medical): Yes    Lack of Transportation (Non-Medical): Yes  Physical Activity: Inactive (08/25/2021)   Exercise Vital Sign    Days of Exercise per Week: 0 days  Minutes of Exercise per Session: 0 min  Stress: Stress Concern Present (08/25/2021)   [place]    Feeling of Stress : Very much  Social Connections: Moderately Integrated (08/25/2021)   Social Connection and Isolation Panel [NHANES]    Frequency of Communication with Friends and Family: More than three times a week    Frequency of Social Gatherings with Friends and Family: More than three times a week    Attends Religious Services: More than 4 times per year    Active Member of [employer] or Organizations: No    Attends Archivist Meetings: Never    Marital Status: Married   Past Surgical History:  Procedure Laterality Date   ABDOMINAL HYSTERECTOMY  06/2005   "w/right ovariy"   AMPUTATION Right 01/28/2020    RIGHT FOURTH AND FIFTH RAY AMPUTATION    AMPUTATION Right 01/28/2020   Procedure: RIGHT FOURTH AND FIFTH RAY AMPUTATION,;  Surgeon: Newt Minion, MD;  Location: [place];  Service: Orthopedics;  Laterality: Right;   AMPUTATION Right 06/01/2021   Procedure: RIGHT BELOW KNEE AMPUTATION;  Surgeon: Newt Minion, MD;  Location: [place];  Service: Orthopedics;  Laterality: Right;   AMPUTATION Right 07/22/2021   Procedure: RIGHT ABOVE KNEE AMPUTATION;  Surgeon: Newt Minion, MD;  Location: [place];  Service: Orthopedics;  Laterality: Right;   APPENDECTOMY     BREAST BIOPSY Bilateral    9 total (04/18/2018)   CORONARY ANGIOPLASTY WITH STENT PLACEMENT  10/01/2015   normal LM, 30% LAD, 85% mid RCA and 95% distal RCA s/p PCI of the mid to distal RCA and now on DAPT with ASA and Ticagrelor.     CORONARY STENT INTERVENTION Right 04/18/2018   Procedure: CORONARY STENT INTERVENTION;  Surgeon: Burnell Blanks, MD;  Location: [facility];  Service: Cardiovascular;  Laterality: Right;   DILATION AND CURETTAGE OF UTERUS     FRACTURE SURGERY     LAPAROSCOPIC CHOLECYSTECTOMY     LEFT HEART CATH AND CORONARY ANGIOGRAPHY N/A 04/18/2018   Procedure: LEFT HEART CATH AND CORONARY ANGIOGRAPHY;  Surgeon: Burnell Blanks, MD;  Location: [place] [facility];  Service: Cardiovascular;  Laterality: N/A;   MUSCLE BIOPSY Left    "leg"   OOPHORECTOMY  07/2010   RADIOLOGY WITH ANESTHESIA N/A 05/25/2016   Procedure: RADIOLOGY WITH ANESTHESIA;  Surgeon: Medication Radiologist, MD;  Location: [place];  Service: Radiology;  Laterality: N/A;   STUMP REVISION Right 06/29/2021   Procedure: REVISION RIGHT BELOW KNEE AMPUTATION;  Surgeon: Newt Minion, MD;  Location: [place];  Service: Orthopedics;  Laterality: Right;   TEE WITHOUT CARDIOVERSION N/A 06/01/2021   Procedure: TRANSESOPHAGEAL ECHOCARDIOGRAM (TEE);  Surgeon: Geralynn Rile, MD;  Location: [place];  Service: Cardiovascular;  Laterality: N/A;   TONSILLECTOMY AND ADENOIDECTOMY     WRIST FRACTURE  SURGERY Left    "crushed it"   Past Surgical History:  Procedure Laterality Date   ABDOMINAL HYSTERECTOMY  06/2005   "w/right ovariy"   AMPUTATION Right 01/28/2020    RIGHT FOURTH AND FIFTH RAY AMPUTATION   AMPUTATION Right 01/28/2020   Procedure: RIGHT FOURTH AND FIFTH RAY AMPUTATION,;  Surgeon: Newt Minion, MD;  Location: [place];  Service: Orthopedics;  Laterality: Right;   AMPUTATION Right 06/01/2021   Procedure: RIGHT BELOW KNEE AMPUTATION;  Surgeon: Newt Minion, MD;  Location: [place];  Service: Orthopedics;  Laterality: Right;   AMPUTATION Right 07/22/2021  Procedure: RIGHT ABOVE KNEE AMPUTATION;  Surgeon: [name], MD;  Location: [facility];  Service: Orthopedics;  Laterality: Right;   APPENDECTOMY     BREAST BIOPSY Bilateral    9 total (04/18/2018)   CORONARY ANGIOPLASTY WITH STENT PLACEMENT  10/01/2015   normal LM, 30% LAD, 85% mid RCA and 95% distal RCA s/p PCI of the mid to distal RCA and now on DAPT with ASA and Ticagrelor.     CORONARY STENT INTERVENTION Right 04/18/2018   Procedure: CORONARY STENT INTERVENTION;  Surgeon: [name], MD;  [facility];  Service: Cardiovascular;  Laterality: Right;   DILATION AND CURETTAGE OF UTERUS     FRACTURE SURGERY     LAPAROSCOPIC CHOLECYSTECTOMY     LEFT HEART CATH AND CORONARY ANGIOGRAPHY N/A 04/18/2018   Procedure: LEFT HEART CATH AND CORONARY ANGIOGRAPHY;  Surgeon: [name], MD;  [facility];  Service: Cardiovascular;  Laterality: N/A;   MUSCLE BIOPSY Left    "leg"   OOPHORECTOMY  07/2010   RADIOLOGY WITH ANESTHESIA N/A 05/25/2016   Procedure: RADIOLOGY WITH ANESTHESIA;  Surgeon: Medication Radiologist, MD;  [facility];  Service: Radiology;  Laterality: N/A;   STUMP REVISION Right 06/29/2021   Procedure: REVISION RIGHT BELOW KNEE AMPUTATION;  Surgeon: [name], MD;  Location: [facility] OR;  Service: Orthopedics;  Laterality: Right;   TEE WITHOUT CARDIOVERSION N/A  06/01/2021   Procedure: TRANSESOPHAGEAL ECHOCARDIOGRAM (TEE);  Surgeon: Sande Rives, MD;  Location: [facility] OR;  Service: Cardiovascular;  Laterality: N/A;   TONSILLECTOMY AND ADENOIDECTOMY     WRIST FRACTURE SURGERY Left    "crushed it"   Past Medical History:  Diagnosis Date   Aortic stenosis    mild AS by echo 02/2021   Asthma    "on daily RX and rescue inhaler" (04/18/2018)   Chronic diastolic CHF (congestive heart failure) (HCC) 09/2015   Chronic lower back pain    Chronic neck pain    Chronic pain syndrome    Fentanyl Patch   Colon polyps    Coronary artery disease    cath with normal LM, 30% LAD, 85% mid RCA and 95% distal RCA s/p PCI of the mid to distal RCA and now on DAPT with ASA and Ticagrelor.     Eczema    Excessive daytime sleepiness 11/26/2015   Fibromyalgia    Gallstones    GERD (gastroesophageal reflux disease)    Heart murmur    "noted for the 1st time on 04/18/2018"   History of blood transfusion 07/2010   "S/P oophorectomy"   History of gout    History of hiatal hernia 1980s   "gone now" (04/18/2018)   Hyperlipidemia    Hypertension    takes Metoprolol and Enalapril daily   Hypothyroidism    takes Synthroid daily   IBS (irritable bowel syndrome)    Migraine    "nothing in the 2000s" (04/18/2018)   Mixed connective tissue disease (HCC)    NAFLD (nonalcoholic fatty liver disease)    Pneumonia    "several times" (04/18/2018)   PVC's (premature ventricular contractions)    noted on event monitor 02/2021   Rheumatoid arthritis (HCC)    "hands, elbows, shoulders, probably knees" (04/18/2018)   Scoliosis    Sjogren's syndrome (HCC)    Sleep apnea    mild - does not use cpap   Spondylosis    Type II diabetes mellitus (HCC)    takes Metformin and Hum  R daily (04/18/2018)   Walker as ambulation aid    also uses wheelchair   There were no vitals taken for this visit.  Opioid Risk Score:   Fall Risk Score:  `1  Depression screen [facility] 2/9     03/30/2022    11:08 AM 02/27/2022    1:37 PM 01/31/2022   10:18 AM 12/20/2021   10:56 AM 10/24/2021   12:21 PM 09/27/2021    9:50 AM 08/25/2021    8:56 PM  Depression screen PHQ 2/9  Decreased Interest 1 1 0 3 1 0 0  Down, Depressed, Hopeless 1 1 0 3 1 0 1  PHQ - 2 Score 2 2 0 6 2 0 1  Altered sleeping      0   PHQ-9 Score      0     Review of Systems  Musculoskeletal:  Positive for back pain.       Pain in both shoulders, left knee, both legs  All other systems reviewed and are negative.      Objective:   Physical Exam        Assessment & Plan:  HX: of Right AKA: Dr Lajoyce Corners Following. Continue to Monitor. 03/30/2022 Diabetic Polyneuropathy associated with Type 2 DM: Continue current medication regimen with Lyrica. Continue to Monitor. 03/30/2022 DM Type 2: PCP Following. Continue current medication regimen. Continue to Monitor. 03/30/2022 Chronic  Pain Syndrome : Refilled: Oxycodone 20 mg one tablet every 8 hours as needed for pain #90. We will continue the opioid monitoring program, this consists of regular clinic visits, examinations, urine drug screen, pill counts as well as use of [place] Controlled Substance Reporting system. A 12 month History has been reviewed on the [place] Controlled Substance Reporting System on 03/30/2022 Stage 1 Decubitus: Left Buttock and Left Lower Extremity: Continue Adequate Nutrition and Weight Shifting.  She states Wound Care  is Following. She  Continue to Monitor. 03/30/2022  6. Cellulitis: Ms. Celestin was hospitalized on 04/07-2022-o4/18/2023, discharge summary was reviewed.  Continu to Monitpr. PCP and [facility] Care Following. 7. Phantom Pain: Continue Gabapentin  Continue to Monitor. 03/30/2022 8. Polyarthralgia: Continue HEP as Tolerated. Continue to Monitor. 03/30/2022   F/U in 1 Month

## 2022-05-05 ENCOUNTER — Ambulatory Visit: Payer: 59 | Admitting: Rheumatology

## 2022-05-05 DIAGNOSIS — M19072 Primary osteoarthritis, left ankle and foot: Secondary | ICD-10-CM

## 2022-05-05 DIAGNOSIS — M351 Other overlap syndromes: Secondary | ICD-10-CM

## 2022-05-05 DIAGNOSIS — I2510 Atherosclerotic heart disease of native coronary artery without angina pectoris: Secondary | ICD-10-CM

## 2022-05-05 DIAGNOSIS — H905 Unspecified sensorineural hearing loss: Secondary | ICD-10-CM

## 2022-05-05 DIAGNOSIS — B955 Unspecified streptococcus as the cause of diseases classified elsewhere: Secondary | ICD-10-CM

## 2022-05-05 DIAGNOSIS — Z8619 Personal history of other infectious and parasitic diseases: Secondary | ICD-10-CM

## 2022-05-05 DIAGNOSIS — K589 Irritable bowel syndrome without diarrhea: Secondary | ICD-10-CM

## 2022-05-05 DIAGNOSIS — Z794 Long term (current) use of insulin: Secondary | ICD-10-CM

## 2022-05-05 DIAGNOSIS — L03119 Cellulitis of unspecified part of limb: Secondary | ICD-10-CM

## 2022-05-05 DIAGNOSIS — K219 Gastro-esophageal reflux disease without esophagitis: Secondary | ICD-10-CM

## 2022-05-05 DIAGNOSIS — I10 Essential (primary) hypertension: Secondary | ICD-10-CM

## 2022-05-05 DIAGNOSIS — L89152 Pressure ulcer of sacral region, stage 2: Secondary | ICD-10-CM

## 2022-05-05 DIAGNOSIS — Z8639 Personal history of other endocrine, nutritional and metabolic disease: Secondary | ICD-10-CM

## 2022-05-05 DIAGNOSIS — I739 Peripheral vascular disease, unspecified: Secondary | ICD-10-CM

## 2022-05-05 DIAGNOSIS — K760 Fatty (change of) liver, not elsewhere classified: Secondary | ICD-10-CM

## 2022-05-05 DIAGNOSIS — G720 Drug-induced myopathy: Secondary | ICD-10-CM

## 2022-05-05 DIAGNOSIS — Z79899 Other long term (current) drug therapy: Secondary | ICD-10-CM

## 2022-05-05 DIAGNOSIS — M5136 Other intervertebral disc degeneration, lumbar region: Secondary | ICD-10-CM

## 2022-05-05 DIAGNOSIS — I5033 Acute on chronic diastolic (congestive) heart failure: Secondary | ICD-10-CM

## 2022-05-05 DIAGNOSIS — T8781 Dehiscence of amputation stump: Secondary | ICD-10-CM

## 2022-05-05 DIAGNOSIS — S78111A Complete traumatic amputation at level between right hip and knee, initial encounter: Secondary | ICD-10-CM

## 2022-05-05 DIAGNOSIS — J4541 Moderate persistent asthma with (acute) exacerbation: Secondary | ICD-10-CM

## 2022-05-05 DIAGNOSIS — M19041 Primary osteoarthritis, right hand: Secondary | ICD-10-CM

## 2022-05-05 DIAGNOSIS — M797 Fibromyalgia: Secondary | ICD-10-CM

## 2022-05-05 NOTE — Progress Notes (Unsigned)
Office Visit Note  Patient: [name]             Date of Birth: [birth year]           MRN: [id number]             PCP: Shelda Pal, DO Referring: Shelda Pal* Visit Date: 05/09/2022 Occupation: _0 @  Subjective:  No chief complaint on file.   History of Present Illness: [name] is a 61 y.o. female ***   Activities of Daily Living:  Patient reports morning stiffness for *** {minute/hour:19697}.   Patient {ACTIONS;DENIES/REPORTS:21021675::"Denies"} nocturnal pain.  Difficulty dressing/grooming: {ACTIONS;DENIES/REPORTS:21021675::"Denies"} Difficulty climbing stairs: {ACTIONS;DENIES/REPORTS:21021675::"Denies"} Difficulty getting out of chair: {ACTIONS;DENIES/REPORTS:21021675::"Denies"} Difficulty using hands for taps, buttons, cutlery, and/or writing: {ACTIONS;DENIES/REPORTS:21021675::"Denies"}  No Rheumatology ROS completed.   PMFS History:  Patient Active Problem List   Diagnosis Date Noted   Sacral decubitus ulcer, stage II ([place]) 12/24/2021   Class 3 obesity ([place]) 12/23/2021   Iron deficiency anemia 12/23/2021   Acute on chronic diastolic CHF (congestive heart failure) ([place]) 12/22/2021   Hyperkalemia 12/22/2021   Left leg cellulitis 12/20/2021   Peripheral vascular disease, unspecified ([place]) 10/10/2021   Unilateral AKA, right ([place]) 10/10/2021   Hx of AKA (above knee amputation), right ([place]) 07/22/2021   Dehiscence of amputation stump (HCC)    Acute blood loss anemia 06/24/2021   Postoperative wound infection 06/24/2021   UTI (urinary tract infection) 06/24/2021   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 ([place])    Bacteremia due to Enterococcus 05/28/2021   Sepsis ([place]) 05/26/2021   Digestive disorder 05/18/2021   Bilateral lower extremity edema 05/02/2021   Statin myopathy 03/04/2021   Aortic stenosis 03/04/2021   Sjogren's syndrome ([place]) 02/11/2020   Cutaneous abscess of right foot    Statin  intolerance 08/16/2019   Gram-negative bacteremia 10/18/2018   Streptococcal bacteremia 10/18/2018   CAD S/P percutaneous coronary angioplasty 04/19/2018   Essential hypertension    Moderate persistent asthma 03/21/2018   NAFLD (nonalcoholic fatty liver disease)    Recurrent cellulitis of lower extremity 01/02/2018   Cellulitis of lower leg 01/02/2018   Chronic diarrhea 05/08/2017   H/O Clostridium difficile infection 05/08/2017   Rectal bleeding 05/08/2017   Hyperbilirubinemia 04/29/2016   Hyponatremia 04/29/2016   Cellulitis of right foot 03/09/2016   Allergic rhinitis due to pollen 02/15/2016   Anaphylactic reaction due to food 02/10/2016   Type 2 diabetes mellitus with hyperglycemia, with long-term current use of insulin ([place]) 02/10/2016   Gastroesophageal reflux disease without esophagitis 02/10/2016   Atopic eczema 02/10/2016   Cervical nerve root disorder 12/15/2015   Lumbar radiculopathy 12/15/2015   Daytime somnolence 11/26/2015   Venous stasis dermatitis of both lower extremities 02/11/2015   Morbid obesity ([place]) 06/19/2014   Abnormal LFTs 03/26/2014   B-complex deficiency 03/26/2014   Benign essential HTN 03/26/2014   Chronic pain associated with significant psychosocial dysfunction 03/26/2014   Diaphragmatic hernia 03/26/2014   Gastroesophageal reflux disease 03/26/2014   Hammer toe 03/26/2014   H/O neoplasm 03/26/2014   Adaptive colitis 03/26/2014   Deafness, sensorineural 03/26/2014   Fibromyalgia 01/20/2014   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/20/2014   Hyperlipidemia 11/11/2012   Mixed connective tissue disease ([place]) 11/11/2012   Hypothyroidism 67/61/9509   Uncomplicated asthma 32/67/1245   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[place]) 02/20/2012   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     Eczema    Excessive daytime sleepiness 11/26/2015   Fibromyalgia    Gallstones    GERD (gastroesophageal reflux disease)    Heart murmur    "noted for the 1st time on 04/18/2018"   History of blood transfusion 07/2010   "S/P oophorectomy"   History of gout    History of hiatal hernia 1980s   "gone now" (04/18/2018)   Hyperlipidemia    Hypertension    takes Metoprolol and Enalapril daily   Hypothyroidism    takes Synthroid daily   IBS (irritable bowel syndrome)    Migraine    "nothing in the 2000s" (04/18/2018)   Mixed connective tissue disease ([place])    NAFLD (nonalcoholic fatty liver disease)    Pneumonia    "several times" (04/18/2018)   PVC's (premature ventricular contractions)    noted on event monitor 02/2021   Rheumatoid arthritis ([place])    "hands, elbows, shoulders, probably knees" (04/18/2018)   Scoliosis    Sjogren's syndrome ([place])    Sleep apnea    mild - does not use cpap   Spondylosis    Type II diabetes mellitus ([place])    takes Metformin and Hum R daily (04/18/2018)   Walker as ambulation aid    also uses wheelchair    Family History  Problem Relation Age of Onset   CAD Mother    Hypertension Mother    Heart attack Mother    Stroke Mother    CAD Father    Heart attack Father    Allergic rhinitis Father    Asthma Father    Hypertension Brother    Hypertension Brother    Pancreatic cancer Paternal Aunt    Breast cancer Paternal Aunt    Lung cancer Paternal Aunt    Allergic rhinitis Son    Asthma Son    Past Surgical History:  Procedure Laterality Date    ABDOMINAL HYSTERECTOMY  06/2005   "w/right ovariy"   AMPUTATION Right 01/28/2020    RIGHT FOURTH AND FIFTH RAY AMPUTATION   AMPUTATION Right 01/28/2020   Procedure: RIGHT FOURTH AND FIFTH RAY AMPUTATION,;  Surgeon: Newt Minion, MD;  Location: [place];  Service: Orthopedics;  Laterality: Right;   AMPUTATION Right 06/01/2021   Procedure: RIGHT BELOW KNEE AMPUTATION;  Surgeon: Newt Minion, MD;  Location: [place];  Service: Orthopedics;  Laterality: Right;   AMPUTATION Right 07/22/2021   Procedure: RIGHT ABOVE KNEE AMPUTATION;  Surgeon: Newt Minion, MD;  Location: [place];  Service: Orthopedics;  Laterality: Right;   APPENDECTOMY     BREAST BIOPSY Bilateral    9 total (04/18/2018)   CORONARY ANGIOPLASTY WITH STENT PLACEMENT  10/01/2015   normal LM, 30% LAD, 85% mid RCA and 95% distal RCA s/p PCI of the mid to distal RCA and now on DAPT with ASA and Ticagrelor.     CORONARY STENT  INTERVENTION Right 04/18/2018   Procedure: CORONARY STENT INTERVENTION;  Surgeon: Burnell Blanks, MD;  Location: [place] [facility];  Service: Cardiovascular;  Laterality: Right;   DILATION AND CURETTAGE OF UTERUS     FRACTURE SURGERY     LAPAROSCOPIC CHOLECYSTECTOMY     LEFT HEART CATH AND CORONARY ANGIOGRAPHY N/A 04/18/2018   Procedure: LEFT HEART CATH AND CORONARY ANGIOGRAPHY;  Surgeon: Burnell Blanks, MD;  Location: [place] [facility];  Service: Cardiovascular;  Laterality: N/A;   MUSCLE BIOPSY Left    "leg"   OOPHORECTOMY  07/2010   RADIOLOGY WITH ANESTHESIA N/A 05/25/2016   Procedure: RADIOLOGY WITH ANESTHESIA;  Surgeon: Medication Radiologist, MD;  Location: [place];  Service: Radiology;  Laterality: N/A;   STUMP REVISION Right 06/29/2021   Procedure: REVISION RIGHT BELOW KNEE AMPUTATION;  Surgeon: Newt Minion, MD;  Location: [place];  Service: Orthopedics;  Laterality: Right;   TEE WITHOUT CARDIOVERSION N/A 06/01/2021   Procedure: TRANSESOPHAGEAL ECHOCARDIOGRAM (TEE);  Surgeon: Geralynn Rile, MD;  Location: [place];  Service: Cardiovascular;  Laterality: N/A;   TONSILLECTOMY AND ADENOIDECTOMY     WRIST FRACTURE SURGERY Left    "crushed it"   Social History   Social History Narrative   Not on file   Immunization History  Administered Date(s) Administered   Influenza,inj,Quad PF,6+ Mos 06/30/2015   Pneumococcal Polysaccharide-23 02/25/2014     Objective: Vital Signs: There were no vitals taken for this visit.   Physical Exam   Musculoskeletal Exam: ***  CDAI Exam: CDAI Score: -- Patient Global: --; Provider Global: -- Swollen: --; Tender: -- Joint Exam 05/09/2022   No joint exam has been documented for this visit   There is currently no information documented on the homunculus. Go to the Rheumatology activity and complete the homunculus joint exam.  Investigation: No additional findings.  Imaging: No results found.  Recent Labs: Lab Results  Component Value Date   WBC 8.5 04/07/2022   HGB 9.7 (L) 04/07/2022   PLT 326 04/07/2022   NA 140 04/07/2022   K 3.8 04/07/2022   CL 100 04/07/2022   CO2 28 04/07/2022   GLUCOSE 195 (H) 04/07/2022   BUN 10 04/07/2022   CREATININE 0.78 04/07/2022   BILITOT 0.3 04/07/2022   ALKPHOS 87 12/23/2021   AST 55 (H) 04/07/2022   ALT 45 (H) 04/07/2022   PROT 6.7 04/07/2022   ALBUMIN 3.0 (L) 12/23/2021   CALCIUM 8.9 04/07/2022   GFRAA 87 05/04/2020   April 07, 2022 beta-2 negative, anticardiolipin negative, lupus anticoagulant negative, ANA 1: 640NS, RNP 4.4, (SCL 70, [place], SSA, SSB, dsDNA negative), C3-C4 normal, ESR 31, RF negative, anti-CCP negative  Speciality Comments: No specialty comments available.  Procedures:  No procedures performed Allergies: Fish allergy, Fish oil, Omega-3 fatty acids, Crestor [rosuvastatin], Gabapentin, Lovastatin, Metronidazole, Red yeast rice [cholestin], Rotigotine, Atrovent hfa [ipratropium bromide hfa], Iodine, Other, Pepto-bismol [bismuth subsalicylate], Victoza  [liraglutide], Enalapril, Suprep [na sulfate-k sulfate-mg sulf], and Tape   Assessment / Plan:     Visit Diagnoses: No diagnosis found.  Orders: No orders of the defined types were placed in this encounter.  No orders of the defined types were placed in this encounter.   Face-to-face time spent with patient was *** minutes. Greater than 50% of time was spent in counseling and coordination of care.  Follow-Up Instructions: No follow-ups on file.   Bo Merino, MD  Note - This record has been created using Editor, commissioning.  Chart creation  errors have been sought, but may not always  have been located. Such creation errors do not reflect on  the standard of medical care.

## 2022-05-09 ENCOUNTER — Other Ambulatory Visit: Payer: Self-pay | Admitting: Family Medicine

## 2022-05-09 ENCOUNTER — Encounter: Payer: Self-pay | Admitting: Family Medicine

## 2022-05-09 ENCOUNTER — Encounter: Payer: Self-pay | Admitting: Rheumatology

## 2022-05-09 ENCOUNTER — Ambulatory Visit: Payer: 59 | Attending: Rheumatology | Admitting: Rheumatology

## 2022-05-09 ENCOUNTER — Other Ambulatory Visit: Payer: Self-pay | Admitting: Cardiology

## 2022-05-09 VITALS — BP 125/71 | HR 87 | Resp 19 | Ht 62.0 in | Wt 289.0 lb

## 2022-05-09 DIAGNOSIS — Z79899 Other long term (current) drug therapy: Secondary | ICD-10-CM

## 2022-05-09 DIAGNOSIS — I2510 Atherosclerotic heart disease of native coronary artery without angina pectoris: Secondary | ICD-10-CM

## 2022-05-09 DIAGNOSIS — Z8639 Personal history of other endocrine, nutritional and metabolic disease: Secondary | ICD-10-CM

## 2022-05-09 DIAGNOSIS — S78111A Complete traumatic amputation at level between right hip and knee, initial encounter: Secondary | ICD-10-CM

## 2022-05-09 DIAGNOSIS — J4541 Moderate persistent asthma with (acute) exacerbation: Secondary | ICD-10-CM

## 2022-05-09 DIAGNOSIS — K760 Fatty (change of) liver, not elsewhere classified: Secondary | ICD-10-CM

## 2022-05-09 DIAGNOSIS — Z794 Long term (current) use of insulin: Secondary | ICD-10-CM

## 2022-05-09 DIAGNOSIS — L03119 Cellulitis of unspecified part of limb: Secondary | ICD-10-CM

## 2022-05-09 DIAGNOSIS — H905 Unspecified sensorineural hearing loss: Secondary | ICD-10-CM

## 2022-05-09 DIAGNOSIS — I739 Peripheral vascular disease, unspecified: Secondary | ICD-10-CM

## 2022-05-09 DIAGNOSIS — K219 Gastro-esophageal reflux disease without esophagitis: Secondary | ICD-10-CM

## 2022-05-09 DIAGNOSIS — M19041 Primary osteoarthritis, right hand: Secondary | ICD-10-CM | POA: Diagnosis not present

## 2022-05-09 DIAGNOSIS — K589 Irritable bowel syndrome without diarrhea: Secondary | ICD-10-CM

## 2022-05-09 DIAGNOSIS — L89152 Pressure ulcer of sacral region, stage 2: Secondary | ICD-10-CM

## 2022-05-09 DIAGNOSIS — T8781 Dehiscence of amputation stump: Secondary | ICD-10-CM

## 2022-05-09 DIAGNOSIS — R7881 Bacteremia: Secondary | ICD-10-CM

## 2022-05-09 DIAGNOSIS — J45909 Unspecified asthma, uncomplicated: Secondary | ICD-10-CM

## 2022-05-09 DIAGNOSIS — Z8619 Personal history of other infectious and parasitic diseases: Secondary | ICD-10-CM

## 2022-05-09 DIAGNOSIS — L2089 Other atopic dermatitis: Secondary | ICD-10-CM

## 2022-05-09 DIAGNOSIS — M5136 Other intervertebral disc degeneration, lumbar region: Secondary | ICD-10-CM

## 2022-05-09 DIAGNOSIS — I10 Essential (primary) hypertension: Secondary | ICD-10-CM

## 2022-05-09 DIAGNOSIS — M797 Fibromyalgia: Secondary | ICD-10-CM

## 2022-05-09 DIAGNOSIS — M19042 Primary osteoarthritis, left hand: Secondary | ICD-10-CM

## 2022-05-09 DIAGNOSIS — E1165 Type 2 diabetes mellitus with hyperglycemia: Secondary | ICD-10-CM

## 2022-05-09 DIAGNOSIS — G894 Chronic pain syndrome: Secondary | ICD-10-CM

## 2022-05-09 DIAGNOSIS — B955 Unspecified streptococcus as the cause of diseases classified elsewhere: Secondary | ICD-10-CM

## 2022-05-09 DIAGNOSIS — M351 Other overlap syndromes: Secondary | ICD-10-CM | POA: Diagnosis not present

## 2022-05-09 DIAGNOSIS — I5033 Acute on chronic diastolic (congestive) heart failure: Secondary | ICD-10-CM

## 2022-05-09 DIAGNOSIS — M19072 Primary osteoarthritis, left ankle and foot: Secondary | ICD-10-CM

## 2022-05-09 DIAGNOSIS — Z9861 Coronary angioplasty status: Secondary | ICD-10-CM

## 2022-05-09 DIAGNOSIS — E559 Vitamin D deficiency, unspecified: Secondary | ICD-10-CM

## 2022-05-09 MED ORDER — FUROSEMIDE 20 MG PO TABS: 40.0000 mg | ORAL_TABLET | Freq: Every day | ORAL | 0 refills | Status: DC

## 2022-05-09 MED ORDER — HYDROXYCHLOROQUINE SULFATE 200 MG PO TABS
ORAL_TABLET | ORAL | 0 refills | Status: DC
Start: 2022-05-09 — End: 2023-01-01

## 2022-05-09 NOTE — Patient Instructions (Signed)
Vaccines You are taking a medication(s) that can suppress your immune system.  The following immunizations are recommended: Flu annually Covid-19  Td/Tdap (tetanus, diphtheria, pertussis) every 10 years Pneumonia (Prevnar 15 then Pneumovax 23 at least 1 year apart.  Alternatively, can take Prevnar 20 without needing additional dose) Shingrix: 2 doses from 4 weeks to 6 months apart  Please check with your PCP to make sure you are up to date.  

## 2022-05-10 MED ORDER — DAPAGLIFLOZIN PROPANEDIOL 10 MG PO TABS: 10.0000 mg | ORAL_TABLET | Freq: Every day | ORAL | 1 refills | Status: DC

## 2022-05-10 NOTE — Addendum Note (Signed)
Addended by: Scharlene Gloss B on: 05/10/2022 05:13 PM   Modules accepted: Orders

## 2022-05-17 ENCOUNTER — Ambulatory Visit: Payer: Self-pay | Admitting: Family Medicine

## 2022-05-25 ENCOUNTER — Ambulatory Visit: Payer: Self-pay | Admitting: Family Medicine

## 2022-05-26 ENCOUNTER — Encounter: Payer: Self-pay | Admitting: Family Medicine

## 2022-05-26 ENCOUNTER — Ambulatory Visit (INDEPENDENT_AMBULATORY_CARE_PROVIDER_SITE_OTHER): Payer: Commercial Managed Care - HMO | Admitting: Family Medicine

## 2022-05-26 VITALS — BP 140/82 | HR 96 | Temp 98.1°F

## 2022-05-26 DIAGNOSIS — I2510 Atherosclerotic heart disease of native coronary artery without angina pectoris: Secondary | ICD-10-CM

## 2022-05-26 DIAGNOSIS — L97421 Non-pressure chronic ulcer of left heel and midfoot limited to breakdown of skin: Secondary | ICD-10-CM

## 2022-05-26 DIAGNOSIS — E039 Hypothyroidism, unspecified: Secondary | ICD-10-CM | POA: Diagnosis not present

## 2022-05-26 DIAGNOSIS — Z9861 Coronary angioplasty status: Secondary | ICD-10-CM

## 2022-05-26 DIAGNOSIS — Z1231 Encounter for screening mammogram for malignant neoplasm of breast: Secondary | ICD-10-CM

## 2022-05-26 DIAGNOSIS — E1165 Type 2 diabetes mellitus with hyperglycemia: Secondary | ICD-10-CM

## 2022-05-26 DIAGNOSIS — E11621 Type 2 diabetes mellitus with foot ulcer: Secondary | ICD-10-CM

## 2022-05-26 DIAGNOSIS — Z794 Long term (current) use of insulin: Secondary | ICD-10-CM

## 2022-05-26 NOTE — Progress Notes (Signed)
Subjective:   Chief Complaint  Patient presents with   Follow-up    Patient having a lot of pain Sore on left foot     [name] is a 61 y.o. female here for follow-up of diabetes.   [name]'s self monitored glucose range is low 100's.  Patient will get around 1 hypoglycemic episode per week and usually can identify the mistake. She checks her glucose levels several times per week. Patient does require insulin.  She takes a range of Humulin 3 times daily.  The range is from 40-150 units. Medications include: Farxiga 10 mg/d, metformin XR 1000 mg bid Diet is fair.  Exercise: none, limited w AKA No CP.   Hyperlipidemia Patient presents for dyslipidemia follow up. Currently being treated with Zetia 10 mg daily and compliance with treatment thus far has been good. She has failed several statins due to extreme joint pain. She confirms myalgias. Diet/exercise as above she is The patient is known to have coexisting coronary artery disease.  1 week ago, the patient got out of the shower and noticed the skin peeled from her left foot.  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  This was due to a nonhealing wound following surgery.  They have been dressing the area daily and keeping it clean.  They do not currently have wound care.  Past Medical History:  Diagnosis Date   Aortic stenosis    mild AS by echo 02/2021   Asthma    "on daily RX and rescue inhaler" (04/18/2018)   Chronic diastolic CHF (congestive heart failure) (HCC) 09/2015   Chronic lower back pain    Chronic neck pain    Chronic pain syndrome    Fentanyl Patch   Colon polyps    Coronary artery disease    cath with normal LM, 30% LAD, 85% mid RCA and 95% distal RCA s/p PCI of the mid to distal RCA and now on DAPT with ASA and Ticagrelor.     Eczema    Excessive daytime sleepiness 11/26/2015   Fibromyalgia    Gallstones    GERD (gastroesophageal reflux disease)     Heart murmur    "noted for the 1st time on 04/18/2018"   History of blood transfusion 07/2010   "S/P oophorectomy"   History of gout    History of hiatal hernia 1980s   "gone now" (04/18/2018)   Hyperlipidemia    Hypertension    takes Metoprolol and Enalapril daily   Hypothyroidism    takes Synthroid daily   IBS (irritable bowel syndrome)    Migraine    "nothing in the 2000s" (04/18/2018)   Mixed connective tissue disease (HCC)    NAFLD (nonalcoholic fatty liver disease)    Pneumonia    "several times" (04/18/2018)   PVC's (premature ventricular contractions)    noted on event monitor 02/2021   Rheumatoid arthritis (HCC)    "hands, elbows, shoulders, probably knees" (04/18/2018)   Scoliosis    Sjogren's syndrome (HCC)    Sleep apnea    mild - does not use cpap   Spondylosis    Type II diabetes mellitus (HCC)    takes Metformin and Hum R daily (04/18/2018)   Walker as ambulation aid    also uses wheelchair     Related testing: Retinal exam: Done Pneumovax: done  Objective:  BP (!) 140/82   Pulse 96   Temp 98.1 F (36.7 C) (Oral)  SpO2 97%  General:  Well developed, well nourished, in no apparent distress Skin: See below  Lungs:  CTAB, no access msc use Cardio:  RRR Psych: Age appropriate judgment and insight       Assessment:   Type 2 diabetes mellitus with hyperglycemia, with long-term current use of insulin (HCC) - Plan: Lipid panel, Hemoglobin A1c, Microalbumin / creatinine urine ratio, Comprehensive metabolic panel, Sedimentation rate, CANCELED: Sedimentation rate  CAD S/P percutaneous coronary angioplasty  Diabetic ulcer of left midfoot associated with type 2 diabetes mellitus, limited to breakdown of skin (HCC) - Plan: Ambulatory referral to Home Health, Sedimentation rate, CANCELED: Sedimentation rate  Hypothyroidism, unspecified type - Plan: TSH, T4, free, Sedimentation rate, CANCELED: Sedimentation rate  Encounter for screening mammogram for malignant  neoplasm of breast - Plan: MM DIGITAL SCREENING BILATERAL   Plan:   Chronic, not currently controlled.  Continue insulin range, sounds like she is in a good spot with her fasting sugars.  Continue metformin XR 1000 mg twice daily, Farxiga 10 mg daily.  Counseled diet and exercise. Chronic, hopefully stable.  Stop Zetia for 2 weeks and then let me know how things are going.  Stay hydrated. Refer to home health for wound care team, hopefully she does not need any further limb salvaging procedures.  Continue to keep the wound clean and dry. Check thyroid levels. If labs are normal, I will see her in 6 months for physical or as needed. The patient voiced understanding and agreement to the plan.  [name] Roche Harrison, DO 05/26/22 4:34 PM

## 2022-05-26 NOTE — Patient Instructions (Addendum)
Stay hydrated.  Stop the Zetia/ezetimibe for 2 weeks with how you are doing.   Keep the diet clean and stay active.  Give [place] 2-3 business days to get the results of your labs back.   Let us know if you need anything.  Pectoralis Major Rehab Ask your health care provider which exercises are safe for you. Do exercises exactly as told by your health care provider and adjust them as directed. It is normal to feel mild stretching, pulling, tightness, or discomfort as you do these exercises, but you should stop right away if you feel sudden pain or your pain gets worse. Do not begin these exercises until told by your health care provider. Stretching and range of motion exercises These exercises warm up your muscles and joints and improve the movement and flexibility of your shoulder. These exercises can also help to relieve pain, numbness, and tingling. Exercise A: Pendulum  Stand near a wall or a surface that you can hold onto for balance. Bend at the waist and let your left / right arm hang straight down. Use your other arm to keep your balance. Relax your arm and shoulder muscles, and move your hips and your trunk so your left / right arm swings freely. Your arm should swing because of the motion of your body, not because you are using your arm or shoulder muscles. Keep moving so your arm swings in the following directions, as told by your health care provider: Side to side. Forward and backward. In clockwise and counterclockwise circles. Slowly return to the starting position. Repeat 2 times. Complete this exercise 3 times per week. Exercise B: Abduction, standing Stand and hold a broomstick, a cane, or a similar object. Place your hands a little more than shoulder-width apart on the object. Your left / right hand should be palm-up, and your other hand should be palm-down. While keeping your elbow straight and your shoulder muscles relaxed, push the stick across your body toward your left /  right side. Raise your left / right arm to the side of your body and then over your head until you feel a stretch in your shoulder. Stop when you reach the angle that is recommended by your health care provider. Avoid shrugging your shoulder while you raise your arm. Keep your shoulder blade tucked down toward the middle of your spine. Hold for 10 seconds. Slowly return to the starting position. Repeat 2 times. Complete this exercise 3 times per week. Exercise C: Wand flexion, supine  Lie on your back. You may bend your knees for comfort. Hold a broomstick, a cane, or a similar object so that your hands are about shoulder-width apart on the object. Your palms should face toward your feet. Raise your left / right arm in front of your face, then behind your head (toward the floor). Use your other hand to help you do this. Stop when you feel a gentle stretch in your shoulder, or when you reach the angle that is recommended by your health care provider. Hold for 3 seconds. Use the broomstick and your other arm to help you return your left / right arm to the starting position. Repeat 2 times. Complete this exercise 3 times per week. Exercise D: Wand shoulder external rotation Stand and hold a broomstick, a cane, or a similar object so your hands are about shoulder-width apart on the object. Start with your arms hanging down, then bend both elbows to an "L" shape (90 degrees). Keep your left /  right elbow at your side. Use your other hand to push the stick so your left / right forearm moves away from your body, out to your side. Keep your left / right elbow bent to 90 degrees and keep it against your side. Stop when you feel a gentle stretch in your shoulder, or when you reach the angle recommended by your health care provider. Hold for 10 seconds. Use the stick to help you return your left / right arm to the starting position. Repeat 2 times. Complete this exercise 3 times per week. Strengthening  exercises These exercises build strength and endurance in your shoulder. Endurance is the ability to use your muscles for a long time, even after your muscles get tired. Exercise E: Scapular protraction, standing Stand so you are facing a wall. Place your feet about one arm-length away from the wall. Place your hands on the wall and straighten your elbows. Keep your hands on the wall as you push your upper back away from the wall. You should feel your shoulder blades sliding forward. Keep your elbows and your head still. If you are not sure that you are doing this exercise correctly, ask your health care provider for more instructions. Hold for 3 seconds. Slowly return to the starting position. Let your muscles relax completely before you repeat this exercise. Repeat 2 times. Complete this exercise 3 times per week. Exercise F: Shoulder blade squeezes  (scapular retraction) Sit with good posture in a stable chair. Do not let your back touch the back of the chair. Your arms should be at your sides with your elbows bent. You may rest your forearms on a pillow if that is more comfortable. Squeeze your shoulder blades together. Bring them down and back. Keep your shoulders level. Do not lift your shoulders up toward your ears. Hold for 3 seconds. Return to the starting position. Repeat 2 times. Complete this exercise 3 times per week. This information is not intended to replace advice given to you by your health care provider. Make sure you discuss any questions you have with your health care provider. Document Released: 08/28/2005 Document Revised: 06/08/2016 Document Reviewed: 05/16/2015 Elsevier Interactive Patient Education  [employer].

## 2022-05-27 LAB — MICROALBUMIN / CREATININE URINE RATIO
Creatinine, Urine: 30 mg/dL (ref 20–275)
Microalb Creat Ratio: 30 mcg/mg creat — ABNORMAL HIGH (ref <30)
Microalb, Ur: 0.9 mg/dL

## 2022-05-27 LAB — TSH: TSH: 9.45 mIU/L — ABNORMAL HIGH (ref 0.40–4.50)

## 2022-05-27 LAB — LIPID PANEL
Cholesterol: 158 mg/dL (ref <200)
HDL: 50 mg/dL (ref >=50)
LDL Cholesterol (Calc): 83 mg/dL (calc)
Non-HDL Cholesterol (Calc): 108 mg/dL (calc) (ref <130)
Total CHOL/HDL Ratio: 3.2 (calc) (ref <5.0)
Triglycerides: 158 mg/dL — ABNORMAL HIGH (ref <150)

## 2022-05-27 LAB — COMPREHENSIVE METABOLIC PANEL
AG Ratio: 1.2 (calc) (ref 1.0–2.5)
ALT: 23 U/L (ref 6–29)
AST: 28 U/L (ref 10–35)
Albumin: 3.8 g/dL (ref 3.6–5.1)
Alkaline phosphatase (APISO): 83 U/L (ref 37–153)
BUN: 12 mg/dL (ref 7–25)
CO2: 26 mmol/L (ref 20–32)
Calcium: 9.5 mg/dL (ref 8.6–10.4)
Chloride: 98 mmol/L (ref 98–110)
Creat: 0.83 mg/dL (ref 0.50–1.05)
Globulin: 3.3 g/dL (calc) (ref 1.9–3.7)
Glucose, Bld: 282 mg/dL — ABNORMAL HIGH (ref 65–99)
Potassium: 3.8 mmol/L (ref 3.5–5.3)
Sodium: 140 mmol/L (ref 135–146)
Total Bilirubin: 0.4 mg/dL (ref 0.2–1.2)
Total Protein: 7.1 g/dL (ref 6.1–8.1)

## 2022-05-27 LAB — SEDIMENTATION RATE: Sed Rate: 34 mm/h — ABNORMAL HIGH (ref 0–30)

## 2022-05-27 LAB — HEMOGLOBIN A1C
Hgb A1c MFr Bld: 9.2 % of total Hgb — ABNORMAL HIGH (ref <5.7)
Mean Plasma Glucose: 217 mg/dL
eAG (mmol/L): 12 mmol/L

## 2022-05-27 LAB — T4, FREE: Free T4: 1.1 ng/dL (ref 0.8–1.8)

## 2022-06-05 ENCOUNTER — Telehealth: Payer: Self-pay

## 2022-06-05 ENCOUNTER — Other Ambulatory Visit: Payer: Self-pay | Admitting: Family Medicine

## 2022-06-05 DIAGNOSIS — E11621 Type 2 diabetes mellitus with foot ulcer: Secondary | ICD-10-CM

## 2022-06-05 NOTE — Telephone Encounter (Signed)
Approved Case ID [id number] 06/05/22-06/05/23

## 2022-06-05 NOTE — Telephone Encounter (Signed)
PA submitted for Oxycodone 

## 2022-06-07 ENCOUNTER — Telehealth: Payer: Self-pay | Admitting: Family Medicine

## 2022-06-07 ENCOUNTER — Other Ambulatory Visit: Payer: Self-pay | Admitting: Family Medicine

## 2022-06-07 NOTE — Telephone Encounter (Signed)
done

## 2022-06-07 NOTE — Telephone Encounter (Signed)
[place] stated they called the pt and she would rather stay in the [facility] system and would like another referral.

## 2022-06-07 NOTE — Telephone Encounter (Signed)
Sent [facility] a msg to resend referral to wound ctr in [place]

## 2022-06-09 ENCOUNTER — Encounter: Payer: Self-pay | Admitting: Family Medicine

## 2022-06-14 ENCOUNTER — Encounter (HOSPITAL_COMMUNITY): Payer: Self-pay

## 2022-06-14 ENCOUNTER — Encounter (HOSPITAL_BASED_OUTPATIENT_CLINIC_OR_DEPARTMENT_OTHER): Payer: Self-pay | Admitting: Emergency Medicine

## 2022-06-14 ENCOUNTER — Other Ambulatory Visit: Payer: Self-pay

## 2022-06-14 ENCOUNTER — Ambulatory Visit (INDEPENDENT_AMBULATORY_CARE_PROVIDER_SITE_OTHER): Payer: Commercial Managed Care - HMO | Admitting: Family Medicine

## 2022-06-14 ENCOUNTER — Emergency Department (HOSPITAL_BASED_OUTPATIENT_CLINIC_OR_DEPARTMENT_OTHER): Payer: Commercial Managed Care - HMO

## 2022-06-14 ENCOUNTER — Encounter: Payer: Self-pay | Admitting: Family Medicine

## 2022-06-14 ENCOUNTER — Other Ambulatory Visit: Payer: Self-pay | Admitting: Family Medicine

## 2022-06-14 ENCOUNTER — Inpatient Hospital Stay (HOSPITAL_BASED_OUTPATIENT_CLINIC_OR_DEPARTMENT_OTHER)
Admission: EM | Admit: 2022-06-14 | Discharge: 2022-06-20 | DRG: 638 | Disposition: A | Discharge status: Home Health | Payer: Commercial Managed Care - HMO | Attending: Internal Medicine | Admitting: Internal Medicine

## 2022-06-14 VITALS — BP 120/71 | HR 88 | Temp 98.4°F

## 2022-06-14 DIAGNOSIS — R700 Elevated erythrocyte sedimentation rate: Secondary | ICD-10-CM | POA: Diagnosis present

## 2022-06-14 DIAGNOSIS — M109 Gout, unspecified: Secondary | ICD-10-CM | POA: Diagnosis present

## 2022-06-14 DIAGNOSIS — Z7984 Long term (current) use of oral hypoglycemic drugs: Secondary | ICD-10-CM

## 2022-06-14 DIAGNOSIS — E1165 Type 2 diabetes mellitus with hyperglycemia: Secondary | ICD-10-CM | POA: Diagnosis not present

## 2022-06-14 DIAGNOSIS — R21 Rash and other nonspecific skin eruption: Secondary | ICD-10-CM | POA: Diagnosis present

## 2022-06-14 DIAGNOSIS — R7881 Bacteremia: Secondary | ICD-10-CM | POA: Diagnosis present

## 2022-06-14 DIAGNOSIS — R110 Nausea: Secondary | ICD-10-CM | POA: Diagnosis not present

## 2022-06-14 DIAGNOSIS — E785 Hyperlipidemia, unspecified: Secondary | ICD-10-CM | POA: Diagnosis present

## 2022-06-14 DIAGNOSIS — E039 Hypothyroidism, unspecified: Secondary | ICD-10-CM | POA: Diagnosis present

## 2022-06-14 DIAGNOSIS — M3500 Sicca syndrome, unspecified: Secondary | ICD-10-CM | POA: Diagnosis present

## 2022-06-14 DIAGNOSIS — D62 Acute posthemorrhagic anemia: Secondary | ICD-10-CM | POA: Diagnosis present

## 2022-06-14 DIAGNOSIS — E1142 Type 2 diabetes mellitus with diabetic polyneuropathy: Secondary | ICD-10-CM | POA: Diagnosis present

## 2022-06-14 DIAGNOSIS — Z888 Allergy status to other drugs, medicaments and biological substances status: Secondary | ICD-10-CM

## 2022-06-14 DIAGNOSIS — B9689 Other specified bacterial agents as the cause of diseases classified elsewhere: Secondary | ICD-10-CM | POA: Diagnosis not present

## 2022-06-14 DIAGNOSIS — J45909 Unspecified asthma, uncomplicated: Secondary | ICD-10-CM | POA: Diagnosis present

## 2022-06-14 DIAGNOSIS — Z91013 Allergy to seafood: Secondary | ICD-10-CM

## 2022-06-14 DIAGNOSIS — Z794 Long term (current) use of insulin: Secondary | ICD-10-CM | POA: Diagnosis not present

## 2022-06-14 DIAGNOSIS — Z79890 Hormone replacement therapy (postmenopausal): Secondary | ICD-10-CM

## 2022-06-14 DIAGNOSIS — L97523 Non-pressure chronic ulcer of other part of left foot with necrosis of muscle: Secondary | ICD-10-CM | POA: Diagnosis present

## 2022-06-14 DIAGNOSIS — E11621 Type 2 diabetes mellitus with foot ulcer: Secondary | ICD-10-CM | POA: Diagnosis present

## 2022-06-14 DIAGNOSIS — I5032 Chronic diastolic (congestive) heart failure: Secondary | ICD-10-CM | POA: Diagnosis present

## 2022-06-14 DIAGNOSIS — E08621 Diabetes mellitus due to underlying condition with foot ulcer: Principal | ICD-10-CM

## 2022-06-14 DIAGNOSIS — E11628 Type 2 diabetes mellitus with other skin complications: Principal | ICD-10-CM | POA: Diagnosis present

## 2022-06-14 DIAGNOSIS — Z8249 Family history of ischemic heart disease and other diseases of the circulatory system: Secondary | ICD-10-CM

## 2022-06-14 DIAGNOSIS — M069 Rheumatoid arthritis, unspecified: Secondary | ICD-10-CM | POA: Diagnosis not present

## 2022-06-14 DIAGNOSIS — L97429 Non-pressure chronic ulcer of left heel and midfoot with unspecified severity: Secondary | ICD-10-CM | POA: Diagnosis not present

## 2022-06-14 DIAGNOSIS — Z6841 Body Mass Index (BMI) 40.0 and over, adult: Secondary | ICD-10-CM | POA: Diagnosis not present

## 2022-06-14 DIAGNOSIS — Z91048 Other nonmedicinal substance allergy status: Secondary | ICD-10-CM

## 2022-06-14 DIAGNOSIS — D509 Iron deficiency anemia, unspecified: Secondary | ICD-10-CM | POA: Diagnosis present

## 2022-06-14 DIAGNOSIS — K760 Fatty (change of) liver, not elsewhere classified: Secondary | ICD-10-CM | POA: Diagnosis present

## 2022-06-14 DIAGNOSIS — L089 Local infection of the skin and subcutaneous tissue, unspecified: Secondary | ICD-10-CM | POA: Diagnosis present

## 2022-06-14 DIAGNOSIS — I110 Hypertensive heart disease with heart failure: Secondary | ICD-10-CM | POA: Diagnosis present

## 2022-06-14 DIAGNOSIS — K580 Irritable bowel syndrome with diarrhea: Secondary | ICD-10-CM | POA: Diagnosis present

## 2022-06-14 DIAGNOSIS — Z7982 Long term (current) use of aspirin: Secondary | ICD-10-CM

## 2022-06-14 DIAGNOSIS — Z89611 Acquired absence of right leg above knee: Secondary | ICD-10-CM | POA: Diagnosis not present

## 2022-06-14 DIAGNOSIS — G894 Chronic pain syndrome: Secondary | ICD-10-CM | POA: Diagnosis present

## 2022-06-14 DIAGNOSIS — Z87891 Personal history of nicotine dependence: Secondary | ICD-10-CM

## 2022-06-14 DIAGNOSIS — Z9102 Food additives allergy status: Secondary | ICD-10-CM

## 2022-06-14 DIAGNOSIS — M797 Fibromyalgia: Secondary | ICD-10-CM | POA: Diagnosis present

## 2022-06-14 DIAGNOSIS — Z955 Presence of coronary angioplasty implant and graft: Secondary | ICD-10-CM

## 2022-06-14 DIAGNOSIS — L03116 Cellulitis of left lower limb: Secondary | ICD-10-CM | POA: Diagnosis present

## 2022-06-14 DIAGNOSIS — K219 Gastro-esophageal reflux disease without esophagitis: Secondary | ICD-10-CM | POA: Diagnosis present

## 2022-06-14 DIAGNOSIS — E876 Hypokalemia: Secondary | ICD-10-CM | POA: Diagnosis not present

## 2022-06-14 DIAGNOSIS — Z79899 Other long term (current) drug therapy: Secondary | ICD-10-CM

## 2022-06-14 DIAGNOSIS — I2510 Atherosclerotic heart disease of native coronary artery without angina pectoris: Secondary | ICD-10-CM | POA: Diagnosis present

## 2022-06-14 LAB — COMPREHENSIVE METABOLIC PANEL
ALT: 22 U/L (ref 0–44)
AST: 32 U/L (ref 15–41)
Albumin: 3 g/dL — ABNORMAL LOW (ref 3.5–5.0)
Alkaline Phosphatase: 69 U/L (ref 38–126)
Anion gap: 10 (ref 5–15)
BUN: 11 mg/dL (ref 6–20)
CO2: 28 mmol/L (ref 22–32)
Calcium: 8.7 mg/dL — ABNORMAL LOW (ref 8.9–10.3)
Chloride: 101 mmol/L (ref 98–111)
Creatinine, Ser: 0.77 mg/dL (ref 0.44–1.00)
GFR, Estimated: >60 mL/min (ref >60)
Glucose, Bld: 124 mg/dL — ABNORMAL HIGH (ref 70–99)
Potassium: 3.1 mmol/L — ABNORMAL LOW (ref 3.5–5.1)
Sodium: 139 mmol/L (ref 135–145)
Total Bilirubin: 0.3 mg/dL (ref 0.3–1.2)
Total Protein: 7.4 g/dL (ref 6.5–8.1)

## 2022-06-14 LAB — CBC
HCT: 32.2 % — ABNORMAL LOW (ref 36.0–46.0)
Hemoglobin: 9.3 g/dL — ABNORMAL LOW (ref 12.0–15.0)
MCHC: 28.9 g/dL — ABNORMAL LOW (ref 30.0–36.0)
MCV: 63.7 fl — ABNORMAL LOW (ref 78.0–100.0)
Platelets: 335 10*3/uL (ref 150.0–400.0)
RBC: 5.06 Mil/uL (ref 3.87–5.11)
RDW: 19.9 % — ABNORMAL HIGH (ref 11.5–15.5)
WBC: 11.3 10*3/uL — ABNORMAL HIGH (ref 4.0–10.5)

## 2022-06-14 LAB — SEDIMENTATION RATE: Sed Rate: 124 mm/hr — ABNORMAL HIGH (ref 0–30)

## 2022-06-14 LAB — LACTIC ACID, PLASMA
Lactic Acid, Venous: 3.2 mmol/L (ref 0.5–1.9)
Lactic Acid, Venous: 1.4 mmol/L (ref 0.5–1.9)

## 2022-06-14 LAB — C-REACTIVE PROTEIN: CRP: 9.1 mg/dL (ref 0.5–20.0)

## 2022-06-14 MED ORDER — SODIUM CHLORIDE 0.9 % IV SOLN
2.0000 g | INTRAVENOUS | Status: DC
  Administered 2022-06-14 – 2022-06-16 (×3): 2 g via INTRAVENOUS
  Filled 2022-06-14 (×3): qty 20

## 2022-06-14 MED ORDER — ALBUTEROL SULFATE (2.5 MG/3ML) 0.083% IN NEBU
2.5000 mg | INHALATION_SOLUTION | Freq: Four times a day (QID) | RESPIRATORY_TRACT | Status: DC | PRN
  Administered 2022-06-18: 2.5 mg via RESPIRATORY_TRACT
  Filled 2022-06-14: qty 3

## 2022-06-14 MED ORDER — SODIUM CHLORIDE 0.9 % IV BOLUS (SEPSIS)
1000.0000 mL | Freq: Once | INTRAVENOUS | Status: AC
  Administered 2022-06-14: 1000 mL via INTRAVENOUS

## 2022-06-14 MED ORDER — FENTANYL CITRATE PF 50 MCG/ML IJ SOSY
25.0000 ug | PREFILLED_SYRINGE | Freq: Once | INTRAMUSCULAR | Status: AC
  Administered 2022-06-14: 25 ug via INTRAVENOUS
  Filled 2022-06-14: qty 1

## 2022-06-14 MED ORDER — VANCOMYCIN HCL IN DEXTROSE 1-5 GM/200ML-% IV SOLN
1000.0000 mg | Freq: Once | INTRAVENOUS | Status: AC
  Administered 2022-06-14: 1000 mg via INTRAVENOUS
  Filled 2022-06-14: qty 200

## 2022-06-14 MED ORDER — VANCOMYCIN HCL 1750 MG/350ML IV SOLN
1750.0000 mg | INTRAVENOUS | Status: DC
  Administered 2022-06-15 – 2022-06-19 (×5): 1750 mg via INTRAVENOUS
  Filled 2022-06-14 (×6): qty 350

## 2022-06-14 MED ORDER — SODIUM CHLORIDE 0.9 % IV SOLN: INTRAVENOUS | Status: DC

## 2022-06-14 MED ORDER — MORPHINE SULFATE (PF) 2 MG/ML IV SOLN
2.0000 mg | INTRAVENOUS | Status: DC | PRN
  Administered 2022-06-14 – 2022-06-15 (×7): 2 mg via INTRAVENOUS
  Filled 2022-06-14 (×7): qty 1

## 2022-06-14 MED ORDER — ONDANSETRON HCL 4 MG/2ML IJ SOLN
4.0000 mg | INTRAMUSCULAR | Status: DC | PRN
  Administered 2022-06-14: 4 mg via INTRAVENOUS
  Filled 2022-06-14: qty 2

## 2022-06-14 MED ORDER — METRONIDAZOLE 500 MG/100ML IV SOLN
500.0000 mg | Freq: Two times a day (BID) | INTRAVENOUS | Status: DC
  Administered 2022-06-14 – 2022-06-16 (×5): 500 mg via INTRAVENOUS
  Filled 2022-06-14 (×5): qty 100

## 2022-06-14 MED ORDER — VANCOMYCIN HCL 10 G IV SOLR
2500.0000 mg | Freq: Once | INTRAVENOUS | Status: DC
  Filled 2022-06-14: qty 25

## 2022-06-14 MED ORDER — DOXYCYCLINE HYCLATE 100 MG PO TABS: 100.0000 mg | ORAL_TABLET | Freq: Two times a day (BID) | ORAL | 0 refills | Status: DC

## 2022-06-14 MED ORDER — PROMETHAZINE HCL 25 MG/ML IJ SOLN
12.5000 mg | Freq: Once | INTRAMUSCULAR | Status: AC
  Administered 2022-06-14: 12.5 mg via INTRAMUSCULAR

## 2022-06-14 MED ORDER — VANCOMYCIN HCL IN DEXTROSE 1-5 GM/200ML-% IV SOLN: 1000.0000 mg | Freq: Once | INTRAVENOUS | Status: AC

## 2022-06-14 NOTE — ED Provider Notes (Signed)
[facility] Provider Note   CSN: [id number] Arrival date & time: 06/14/22  1616     History  Chief Complaint  Patient presents with   Wound Check    [name] is a 61 y.o. female with Past medical history of Sjogren's, rheumatoid arthritis, fibromyalgia, morbid obesity, type 2 diabetes with peripheral neuropathy, venous stasis dermatitis bilateral lower extremities, who presents with concern for osteomyelitis of left foot after wound on bottom of foot for 3 weeks.  Patient had elevated white blood cell count, elevated sed rate of 124 at PCP earlier this morning.  Foul smell from wound.  Patient sent for admission for IV antibiotics and evaluation for osteomyelitis by PCP.  She denies any significant systemic fever, chills.  She reports the wound started as a flap of skin while she was in the shower, she does not recall any injury but noticed it shortly after it happened.  She has a BKA on the right.   Wound Check       Home Medications Prior to Admission medications   Medication Sig Start Date End Date Taking? Authorizing Provider  acetaminophen (TYLENOL) 500 MG tablet Take 500 mg by mouth every 8 (eight) hours as needed.    [provider]  albuterol (PROVENTIL) (2.5 MG/3ML) 0.083% nebulizer solution USE 1 VIAL IN NEBULIZER EVERY 4 HOURS AS NEEDED FOR WHEEZING FOR SHORTNESS OF BREATH 08/23/21   Wendling, Crosby Oyster, DO  ascorbic acid (VITAMIN C) 500 MG tablet Take 1 tablet (500 mg total) by mouth 2 (two) times daily. 06/24/21   Love, Ivan Anchors, PA-C  aspirin EC 81 MG tablet Take 1 tablet (81 mg total) by mouth every evening. 10/17/21   Shelda Pal, DO  augmented betamethasone dipropionate (DIPROLENE AF) 0.05 % cream Apply topically 2 (two) times daily. 11/16/21   Shelda Pal, DO  cyclobenzaprine (FLEXERIL) 10 MG tablet Take 10 mg by mouth 3 (three) times daily as needed for muscle spasms.    [provider]   dapagliflozin propanediol (FARXIGA) 10 MG TABS tablet Take 1 tablet (10 mg total) by mouth daily before breakfast. 05/10/22   Wendling, Crosby Oyster, DO  diclofenac Sodium (VOLTAREN) 1 % GEL Apply 2 g topically 4 (four) times daily. 06/24/21   Love, Ivan Anchors, PA-C  DILT-XR 240 MG 24 hr capsule Take 1 capsule (240 mg total) by mouth daily. 04/18/22   Sueanne Margarita, MD  doxycycline (VIBRA-TABS) 100 MG tablet Take 1 tablet (100 mg total) by mouth 2 (two) times daily. 06/14/22   Wendling, Crosby Oyster, DO  EPINEPHrine (EPIPEN 2-PAK) 0.3 mg/0.3 mL IJ SOAJ injection USE AS DIRECTED FOR SEVERE ALLERGIC REACTION. 08/24/21   Shelda Pal, DO  ezetimibe (ZETIA) 10 MG tablet Take 1 tablet (10 mg total) by mouth daily. 09/21/21   Shelda Pal, DO  fluticasone (FLONASE) 50 MCG/ACT nasal spray USE 2 SPRAY(S) IN EACH NOSTRIL ONCE DAILY AS NEEDED FOR ALLERGIES OR  RHINITIS 04/17/22   Shelda Pal, DO  furosemide (LASIX) 20 MG tablet Take 2 tablets by mouth once daily 06/07/22   Nani Ravens, Crosby Oyster, DO  gabapentin (NEURONTIN) 400 MG capsule TAKE 1 CAPSULE BY MOUTH IN THE MORNING AND 1 AT [facility] AND 2 AT BEDTIME 05/09/22   Wendling, Crosby Oyster, DO  GLUCOMANNAN PO Take 1,995 mg by mouth in the morning, at noon, and at bedtime.    [provider]  glucose blood test strip 1 each by Other  route 4 (four) times daily. Contour Next strips 06/24/21   Love, Ivan Anchors, PA-C  guaiFENesin (MUCINEX) 600 MG 12 hr tablet Take by mouth 2 (two) times daily as needed.    [provider]  hydrocortisone cream 1 % Apply 1 application topically 3 (three) times daily as needed for itching (minor skin irritation). 06/24/21   Love, Ivan Anchors, PA-C  hydroxychloroquine (PLAQUENIL) 200 MG tablet Take 200mg  by mouth twice daily Monday through Friday only. None on Saturday or Sunday. 05/09/22   Bo Merino, MD  insulin regular human CONCENTRATED (HUMULIN R) 500 UNIT/ML injection INJECT  0.08-0.3 MLS (40-150 UNITS TOTAL) INTO THE SKIN THREE TIMES DAILY WITH MEALS 01/11/22   Wendling, Crosby Oyster, DO  INSULIN SYRINGE 1CC/29G (B-D INSULIN SYRINGE) 29G X 1/2" 1 ML MISC Use three times daily with insulin.  DX E11.9 06/11/20   Shelda Pal, DO  isosorbide mononitrate (IMDUR) 30 MG 24 hr tablet Take 1 tablet by mouth once daily 05/09/22   Sueanne Margarita, MD  levocetirizine (XYZAL) 5 MG tablet TAKE 1 TABLET BY MOUTH ONCE DAILY IN THE EVENING 05/09/22   Wendling, Crosby Oyster, DO  levothyroxine (SYNTHROID) 200 MCG tablet TAKE 1 TABLET BY MOUTH ONCE DAILY BEFORE BREAKFAST 05/09/22   Shelda Pal, DO  metFORMIN (GLUCOPHAGE-XR) 500 MG 24 hr tablet Take 2 tablets by mouth twice daily 05/09/22   Wendling, Crosby Oyster, DO  methocarbamol (ROBAXIN) 750 MG tablet Take 1 tablet (750 mg total) by mouth every 8 (eight) hours as needed for muscle spasms. 12/27/21   Domenic Polite, MD  mometasone-formoterol [facility]) 100-5 MCG/ACT AERO Inhale 2 puffs into the lungs 2 (two) times daily.    [provider]  Multiple Vitamins-Minerals (WOMENS 50+ MULTI VITAMIN PO) Take 1 tablet by mouth daily.    [provider]  mupirocin ointment (BACTROBAN) 2 % Apply 1 application topically 2 (two) times daily as needed (wound care). 11/14/21   Shelda Pal, DO  naloxone [facility]) nasal spray 4 mg/0.1 mL Place 1 spray into the nose as needed (opoid overdose).    [provider]  nitroGLYCERIN (NITROSTAT) 0.4 MG SL tablet DISSOLVE ONE TABLET UNDER THE TONGUE EVERY 5 MINUTES AS NEEDED FOR CHEST PAIN.  DO NOT EXCEED A TOTAL OF 3 DOSES IN 15 MINUTES 08/23/21   Turner, Eber Hong, MD  nystatin (MYCOSTATIN/NYSTOP) powder Apply 1 Application topically 3 (three) times daily. RLE 03/17/22   Shelda Pal, DO  omeprazole (PRILOSEC) 40 MG capsule Take 1 capsule by mouth once daily 05/09/22   Shelda Pal, DO  OVER THE COUNTER MEDICATION 2 (two) times a week. Zinc  Wraps for legs    [provider]  Oxycodone HCl 20 MG TABS Take 1 tablet (20 mg total) by mouth every 8 (eight) hours as needed. 05/02/22   Bayard Hugger, NP  oxymetazoline (AFRIN) 0.05 % nasal spray Place 2 sprays into both nostrils 2 (two) times daily as needed.    [provider]  Polyethyl Glycol-Propyl Glycol (SYSTANE) 0.4-0.3 % SOLN Place 1-2 drops into both eyes in the morning.    [provider]  pramipexole (MIRAPEX) 1 MG tablet TAKE 1 TABLET BY MOUTH THREE TIMES DAILY AS NEEDED AND 1/2 (ONE-HALF) TABLET TWICE DAILY AS NEEDED FOR  BREAKTHROUGH  RESTLESS  LEG 06/07/22   Wendling, Crosby Oyster, DO  pregabalin (LYRICA) 50 MG capsule Take 1 capsule (50 mg total) by mouth 3 (three) times daily. 02/27/22   Leeroy Cha  P, MD  promethazine (PHENERGAN) 25 MG tablet Take 1 tablet (25 mg total) by mouth every 8 (eight) hours as needed for vomiting or nausea. 07/05/21   Shelda Pal, DO  saccharomyces boulardii (FLORASTOR) 250 MG capsule Take 250 mg by mouth 3 (three) times daily.    [provider]  triamcinolone cream (KENALOG) 0.1 % Apply 1 Application topically 3 (three) times daily. RLE 03/17/22   Shelda Pal, DO  VENTOLIN HFA 108 (90 Base) MCG/ACT inhaler INHALE 2 PUFFS BY MOUTH EVERY 4 HOURS AS NEEDED FOR WHEEZING AND  FOR  SHORTNESS  OF  BREATH 05/09/22   Wendling, Crosby Oyster, DO  Vitamin D, Ergocalciferol, (DRISDOL) 1.25 MG (50000 UNIT) CAPS capsule TAKE 1 CAPSULE BY MOUTH ONCE A WEEK **TAKE  ON  FRIDAY** 05/09/22   Wendling, Crosby Oyster, DO  ZINC OXIDE, TOPICAL, 10 % CREA Apply topically.    [provider]  ZINC SULFATE PO Take 255 mg by mouth daily.    [provider]      Allergies    Fish allergy, Fish oil, Omega-3 fatty acids, Crestor [rosuvastatin], Gabapentin, Lovastatin, Metronidazole, Red yeast rice [cholestin], Rotigotine, Atrovent hfa [ipratropium bromide hfa], Iodine, Other, Pepto-bismol [bismuth  subsalicylate], Victoza [liraglutide], Enalapril, Suprep [na sulfate-k sulfate-mg sulf], and Tape    Review of Systems   Review of Systems  Skin:  Positive for wound.  All other systems reviewed and are negative.   Physical Exam Updated Vital Signs BP (!) 114/43   Pulse 90   Temp 98.4 F (36.9 C) (Oral)   Resp 18   Ht 5\' 2"  (1.575 m)   Wt (!) 140.2 kg   SpO2 95%   BMI 56.52 kg/m  Physical Exam Vitals and nursing note reviewed.  Constitutional:      General: She is not in acute distress.    Appearance: Normal appearance. She is obese.  HENT:     Head: Normocephalic and atraumatic.  Eyes:     General:        Right eye: No discharge.        Left eye: No discharge.  Cardiovascular:     Rate and Rhythm: Normal rate and regular rhythm.     Comments: DP, PT pulses are palpable in the left lower extremity, 1-2+. Pulmonary:     Effort: Pulmonary effort is normal. No respiratory distress.  Musculoskeletal:        General: No deformity.  Skin:    General: Skin is warm and dry.     Capillary Refill: Capillary refill takes less than 2 seconds.     Comments: Venous stasis changes, lymphedema, with some honey colored crusting the ankle and lower calf on the left extremity.  Foul-smelling dark necrotic ulcer on lateral plantar aspect of left foot with tenderness.  Neurological:     Mental Status: She is alert and oriented to person, place, and time.  Psychiatric:        Mood and Affect: Mood normal.        Behavior: Behavior normal.     ED Results / Procedures / Treatments   Labs (all labs ordered are listed, but only abnormal results are displayed) Labs Reviewed  LACTIC ACID, PLASMA - Abnormal; Notable for the following components:      Result Value   Lactic Acid, Venous 3.2 (*)    All other components within normal limits  COMPREHENSIVE METABOLIC PANEL - Abnormal; Notable for the following components:   Potassium 3.1 (*)  Glucose, Bld 124 (*)    Calcium 8.7 (*)     Albumin 3.0 (*)    All other components within normal limits  CULTURE, BLOOD (ROUTINE X 2)  CULTURE, BLOOD (ROUTINE X 2)  LACTIC ACID, PLASMA    EKG None  Radiology DG Foot Complete Left  Result Date: 06/14/2022 CLINICAL DATA:  Wound at bottom of LEFT foot for 4 weeks, question osteomyelitis; history of gout and diabetes mellitus EXAM: LEFT FOOT - COMPLETE 3+ VIEW COMPARISON:  05/26/2021 FINDINGS: Osseous demineralization. Old healed posttraumatic deformity distal fifth metatarsal. Joint spaces preserved. Plantar calcaneal spur. No acute fracture, dislocation, or bone destruction. No definite soft tissue gas. IMPRESSION: No acute osseous abnormalities. Osseous demineralization with healed posttraumatic deformity of distal fifth metatarsal. Electronically Signed   By: Lavonia Dana M.D.   On: 06/14/2022 17:03    Procedures Procedures    Medications Ordered in ED Medications  0.9 %  sodium chloride infusion ( Intravenous New Bag/Given 06/14/22 1707)  cefTRIAXone (ROCEPHIN) 2 g in sodium chloride 0.9 % 100 mL IVPB (0 g Intravenous Stopped 06/14/22 1812)    And  metroNIDAZOLE (FLAGYL) IVPB 500 mg (500 mg Intravenous New Bag/Given 06/14/22 1814)  ondansetron (ZOFRAN) injection 4 mg (4 mg Intravenous Given 06/14/22 1711)  morphine (PF) 2 MG/ML injection 2 mg (2 mg Intravenous Given 06/14/22 1715)    ED Course/ Medical Decision Making/ A&P Clinical Course as of 06/14/22 1840  Wed Jun 14, 2022  1809 Tamera Punt agrees to plan for admission, Sharol Given to consult, Zacarias Pontes for admission. [CP]    Clinical Course User Index [CP] Anselmo Pickler, PA-C                           Medical Decision Making Amount and/or Complexity of Data Reviewed Labs: ordered. Radiology: ordered.  Risk Prescription drug management.   This patient is a 61 y.o. female who presents to the ED for concern of diabetic foot wound, possible osteo, this involves an extensive number of treatment options, and is a  complaint that carries with it a high risk of complications and morbidity. The emergent differential diagnosis prior to evaluation includes, but is not limited to, ulcer, cellulitis, peripheral arterial or other vascular compromise related ulcer versus diabetic ulcer, developing osteomyelitis, systemic sepsis, versus other.   This is not an exhaustive differential.   Past Medical History / Co-morbidities / Social History: Sjogren's, rheumatoid arthritis, fibromyalgia, morbid obesity, type 2 diabetes with peripheral neuropathy, venous stasis dermatitis bilateral lower extremities  Additional history: Chart reviewed. Pertinent results include: Reviewed lab work, PCP visits for guarding this wound from the last several weeks and today, patient with elevated sed rate of 124, white blood cell count elevated at 11, CRP normal today.  Physical Exam: Physical exam performed. The pertinent findings include: Venous stasis changes, lymphedema, with some honey colored crusting the ankle and lower calf on the left extremity.  Foul-smelling dark necrotic ulcer on lateral plantar aspect of left foot with tenderness.  Lab Tests: I ordered, and personally interpreted labs.  The pertinent results include: CMP notable for hypokalemia, potassium 3.1 we will orally replete.  Lactic acid is elevated at 3.2, along with her significantly elevated sed rate, consistent with need for further evaluation for osteomyelitis, likely surgical referral for possible amputation.   Imaging Studies: I ordered imaging studies including film radiograph of the left foot. I independently visualized and interpreted imaging which showed osseous demineralization and healed  changes from left fifth metatarsal fracture remotely, no signs of acute osteomyelitis noted. I agree with the radiologist interpretation.  Medications: I ordered medication including morphine, Zofran, fluid bolus, Rocephin, Flagyl for concern for diabetic foot wound,  osteo.  Pain somewhat improved, patient's foot wound will need to be reevaluated, MRI to evaluate for osteo-, possible surgical consult for amputation as needed if developing osteo.  Consultations Obtained: I requested consultation with the orthopedic surgeon, spoke with Dr. Tamera Punt, hospitalist, spoke with Dr. Trilby Drummer,  and discussed lab and imaging findings as well as pertinent plan - they recommend: Admission for MRI, possible amputation as needed if osteomyelitis present, and IV antibiotics for diabetic foot wound.   Disposition: After consideration of the diagnostic results and the patients response to treatment, I feel that patient would benefit from mission as discussed above.   I discussed this case with my attending physician Dr. [name] who cosigned this note including patient's presenting symptoms, physical exam, and planned diagnostics and interventions. Attending physician stated agreement with plan or made changes to plan which were implemented.    Final Clinical Impression(s) / ED Diagnoses Final diagnoses:  Diabetic ulcer of left midfoot associated with diabetes mellitus due to underlying condition, with necrosis of muscle [facility])    Rx / DC Orders ED Discharge Orders     None         Dorien Chihuahua 06/14/22 1840    Gareth Morgan, MD 06/15/22 1136

## 2022-06-14 NOTE — ED Notes (Signed)
Pt eating at this time.

## 2022-06-14 NOTE — ED Notes (Signed)
Attempted report x2. Spoke with Maylon Cos who will alert RN who will receive pt to call this RN back for report and/or for further questions. [facility] to arrive within 10 minutes.

## 2022-06-14 NOTE — Progress Notes (Signed)
Chief Complaint  Patient presents with   foot infection    Discuss Metformin and farxiga    [name] is a 61 y.o. female here for a skin complaint.  Duration: 4 weeks Location: Left foot Pruritic? No Painful? Yes Drainage? No They were using triple antibiotic ointment and regular dressing changes. Other associated symptoms: It is growing; no fevers or spreading redness Therapies tried thus far: TAO  Past Medical History:  Diagnosis Date   Aortic stenosis    mild AS by echo 02/2021   Asthma    "on daily RX and rescue inhaler" (04/18/2018)   Chronic diastolic CHF (congestive heart failure) ([place]) 09/2015   Chronic lower back pain    Chronic neck pain    Chronic pain syndrome    Fentanyl Patch   Colon polyps    Coronary artery disease    cath with normal LM, 30% LAD, 85% mid RCA and 95% distal RCA s/p PCI of the mid to distal RCA and now on DAPT with ASA and Ticagrelor.     Eczema    Excessive daytime sleepiness 11/26/2015   Fibromyalgia    Gallstones    GERD (gastroesophageal reflux disease)    Heart murmur    "noted for the 1st time on 04/18/2018"   History of blood transfusion 07/2010   "S/P oophorectomy"   History of gout    History of hiatal hernia 1980s   "gone now" (04/18/2018)   Hyperlipidemia    Hypertension    takes Metoprolol and Enalapril daily   Hypothyroidism    takes Synthroid daily   IBS (irritable bowel syndrome)    Migraine    "nothing in the 2000s" (04/18/2018)   Mixed connective tissue disease ([place])    NAFLD (nonalcoholic fatty liver disease)    Pneumonia    "several times" (04/18/2018)   PVC's (premature ventricular contractions)    noted on event monitor 02/2021   Rheumatoid arthritis ([place])    "hands, elbows, shoulders, probably knees" (04/18/2018)   Scoliosis    Sjogren's syndrome ([facility])    Sleep apnea    mild - does not use cpap   Spondylosis    Type II diabetes mellitus ([place])    takes Metformin and Hum R daily (04/18/2018)   Walker as  ambulation aid    also uses wheelchair    BP 120/71 (BP Location: Left Arm, Patient Position: Sitting, Cuff Size: Normal)   Pulse 88   Temp 98.4 F (36.9 C) (Oral)   SpO2 93%  Gen: awake, alert, appearing stated age Lungs: No accessory muscle use Skin: See below.  Mild TTP around wound edges.  No drainage, foul odor, fluctuance, excoriation Psych: Age appropriate judgment and insight   Left foot  Diabetic ulcer of left midfoot associated with type 2 diabetes mellitus, unspecified ulcer stage ([place]) - Plan: CBC, Sedimentation rate, C-reactive protein, promethazine (PHENERGAN) injection 12.5 mg  Nausea - Plan: promethazine (PHENERGAN) injection 12.5 mg  Unfortunately the wound appears to have gotten larger.  We will check above labs, if the sedimentation rate is extremely high, we will send her to the emergency department though osteomyelitis is lower on the differential given her exam.  No obvious indication for an antibiotic.  I would really like her in with the wound care team sooner than later.  The soonest they can get her in is 10/13. F/u prn. The patient and her spouse voiced understanding and agreement to the plan.  30 minutes spent with the patient  and her husband discussing the above plan in addition to reviewing her chart and coordinating care on the same day of the visit.  [place], DO 06/14/22 11:26 AM

## 2022-06-14 NOTE — Patient Instructions (Signed)
Give [place] 2-3 business days to get the results of your labs back.   If things get worse, please go to the ER.   Let us know if you need anything.

## 2022-06-14 NOTE — Addendum Note (Signed)
Addended by: Sharon Seller B on: 06/14/2022 10:10 AM   Modules accepted: Orders

## 2022-06-14 NOTE — ED Triage Notes (Signed)
Pt reports wound on bottom of left foot x 3 weeks. Sent here by PCP for evaluation, they were concerned for osteomyelitis. Elevated sed rate and wbc in labs this morning.

## 2022-06-14 NOTE — Progress Notes (Signed)
Pharmacy Antibiotic Note  [name] is a 61 y.o. female admitted on 06/14/2022 with worsening L side diabetic foot infection.  Pharmacy has been consulted for vancomycin dosing. Scr is 0.77, at baseline.  Plan: Vancomycin 2000mg  IV once for load followed by 1750mg  Q24h (eAUC 466.7, Scr 0.8, Vd 0.5) Rocephin and flagyl per MD Monitor renal function daily, follow culture results for pathogen directed therapy  Height: 5\' 2"  (157.5 cm) Weight: (!) 140.2 kg (309 lb) IBW/kg (Calculated) : 50.1  Temp (24hrs), Avg:98.4 F (36.9 C), Min:98.4 F (36.9 C), Max:98.4 F (36.9 C)  Recent Labs  Lab 06/14/22 0947 06/14/22 1705 06/14/22 1901  WBC 11.3*  --   --   CREATININE  --  0.77  --   LATICACIDVEN  --  3.2* 1.4    Estimated Creatinine Clearance: 101.6 mL/min (by C-G formula based on SCr of 0.77 mg/dL).    Allergies  Allergen Reactions   Fish Allergy Anaphylaxis    INCLUDES OMEGA 3 OILS   Fish Oil Anaphylaxis and Swelling    THROAT SWELLS INCLUDES FISH AS A CLASS   Omega-3 Fatty Acids Swelling    Throat swelling  Has tolerated Glucerna nutritional drinks    Crestor [Rosuvastatin] Other (See Comments)    Extreme joint pain, to this & other statins   Gabapentin Anxiety    Anxiety on high doses   Lovastatin Other (See Comments)    EXTREME JOINT PAIN   Metronidazole Nausea Only and Swelling    Headache and shakes Nausea and vomiting   Red Yeast Rice [Cholestin] Other (See Comments)    Muscle pain and severe joint pain.    Rotigotine Swelling    (Neupro) Extreme edema    Atrovent Hfa [Ipratropium Bromide Hfa] Other (See Comments)    wheezing   Iodine Swelling   Other Other (See Comments)    Surgical Staples causes redness/infection per patient.   Pepto-Bismol [Bismuth Subsalicylate] Nausea And Vomiting    Extreme vomiting   Victoza [Liraglutide]     Unsure of reaction    Enalapril Cough   Suprep [Na Sulfate-K Sulfate-Mg Sulf] Nausea And Vomiting   Tape Other (See  Comments)    Electrodes causes skin breakdown    Antimicrobials this admission: Rocephin 10/4 >>  Flagyl 10/4 >>  Vancomycin 10/4 >>  Dose adjustments this admission: N/a  Microbiology results: 10/4 BCx: collected   Thank you for allowing pharmacy to be a part of this patient's care.  Titus Dubin, PharmD PGY1 Pharmacy Resident 06/14/2022 9:36 PM

## 2022-06-14 NOTE — ED Notes (Signed)
Vanc not verified by pharmacy yet; [facility] is in the department prepping pt for departure. Will alert [facility] RN about pending vanc infusion.

## 2022-06-14 NOTE — ED Notes (Signed)
Pt noted to have SpO2 at 88% on RA, instructed to deep breath. Would improve to 95% after deep breathing but would then drop again to 87-88%. Placed on 3L O2 on [place] with improvement to 95%, Christian PA & Chelsea RT notified.  Pt provided with ginger ale per request.

## 2022-06-15 ENCOUNTER — Observation Stay (HOSPITAL_COMMUNITY): Payer: Commercial Managed Care - HMO

## 2022-06-15 ENCOUNTER — Encounter (HOSPITAL_BASED_OUTPATIENT_CLINIC_OR_DEPARTMENT_OTHER): Payer: Commercial Managed Care - HMO | Attending: General Surgery | Admitting: Internal Medicine

## 2022-06-15 DIAGNOSIS — K760 Fatty (change of) liver, not elsewhere classified: Secondary | ICD-10-CM | POA: Diagnosis present

## 2022-06-15 DIAGNOSIS — G894 Chronic pain syndrome: Secondary | ICD-10-CM | POA: Diagnosis present

## 2022-06-15 DIAGNOSIS — B9689 Other specified bacterial agents as the cause of diseases classified elsewhere: Secondary | ICD-10-CM | POA: Diagnosis present

## 2022-06-15 DIAGNOSIS — E08621 Diabetes mellitus due to underlying condition with foot ulcer: Secondary | ICD-10-CM | POA: Diagnosis not present

## 2022-06-15 DIAGNOSIS — Z89611 Acquired absence of right leg above knee: Secondary | ICD-10-CM | POA: Diagnosis not present

## 2022-06-15 DIAGNOSIS — E11628 Type 2 diabetes mellitus with other skin complications: Secondary | ICD-10-CM

## 2022-06-15 DIAGNOSIS — E1165 Type 2 diabetes mellitus with hyperglycemia: Secondary | ICD-10-CM | POA: Diagnosis present

## 2022-06-15 DIAGNOSIS — L97423 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: Secondary | ICD-10-CM | POA: Diagnosis not present

## 2022-06-15 DIAGNOSIS — Z6841 Body Mass Index (BMI) 40.0 and over, adult: Secondary | ICD-10-CM | POA: Diagnosis not present

## 2022-06-15 DIAGNOSIS — Z794 Long term (current) use of insulin: Secondary | ICD-10-CM | POA: Diagnosis not present

## 2022-06-15 DIAGNOSIS — D62 Acute posthemorrhagic anemia: Secondary | ICD-10-CM | POA: Diagnosis present

## 2022-06-15 DIAGNOSIS — E039 Hypothyroidism, unspecified: Secondary | ICD-10-CM | POA: Diagnosis present

## 2022-06-15 DIAGNOSIS — R7881 Bacteremia: Secondary | ICD-10-CM | POA: Diagnosis present

## 2022-06-15 DIAGNOSIS — J45909 Unspecified asthma, uncomplicated: Secondary | ICD-10-CM | POA: Diagnosis present

## 2022-06-15 DIAGNOSIS — E1142 Type 2 diabetes mellitus with diabetic polyneuropathy: Secondary | ICD-10-CM | POA: Diagnosis present

## 2022-06-15 DIAGNOSIS — E785 Hyperlipidemia, unspecified: Secondary | ICD-10-CM | POA: Diagnosis present

## 2022-06-15 DIAGNOSIS — L089 Local infection of the skin and subcutaneous tissue, unspecified: Secondary | ICD-10-CM

## 2022-06-15 DIAGNOSIS — L97523 Non-pressure chronic ulcer of other part of left foot with necrosis of muscle: Secondary | ICD-10-CM | POA: Diagnosis present

## 2022-06-15 DIAGNOSIS — M3500 Sicca syndrome, unspecified: Secondary | ICD-10-CM | POA: Diagnosis present

## 2022-06-15 DIAGNOSIS — M069 Rheumatoid arthritis, unspecified: Secondary | ICD-10-CM | POA: Diagnosis present

## 2022-06-15 DIAGNOSIS — L03116 Cellulitis of left lower limb: Secondary | ICD-10-CM | POA: Diagnosis present

## 2022-06-15 DIAGNOSIS — E876 Hypokalemia: Secondary | ICD-10-CM | POA: Diagnosis present

## 2022-06-15 DIAGNOSIS — I5032 Chronic diastolic (congestive) heart failure: Secondary | ICD-10-CM | POA: Diagnosis present

## 2022-06-15 DIAGNOSIS — D509 Iron deficiency anemia, unspecified: Secondary | ICD-10-CM | POA: Diagnosis present

## 2022-06-15 DIAGNOSIS — E11621 Type 2 diabetes mellitus with foot ulcer: Secondary | ICD-10-CM | POA: Diagnosis present

## 2022-06-15 DIAGNOSIS — I110 Hypertensive heart disease with heart failure: Secondary | ICD-10-CM | POA: Diagnosis present

## 2022-06-15 LAB — GLUCOSE, CAPILLARY
Glucose-Capillary: 218 mg/dL — ABNORMAL HIGH (ref 70–99)
Glucose-Capillary: 243 mg/dL — ABNORMAL HIGH (ref 70–99)
Glucose-Capillary: 207 mg/dL — ABNORMAL HIGH (ref 70–99)
Glucose-Capillary: 231 mg/dL — ABNORMAL HIGH (ref 70–99)

## 2022-06-15 LAB — CBC
HCT: 32.8 % — ABNORMAL LOW (ref 36.0–46.0)
Hemoglobin: 8.9 g/dL — ABNORMAL LOW (ref 12.0–15.0)
MCH: 18.5 pg — ABNORMAL LOW (ref 26.0–34.0)
MCHC: 27.1 g/dL — ABNORMAL LOW (ref 30.0–36.0)
MCV: 68.2 fL — ABNORMAL LOW (ref 80.0–100.0)
Platelets: 302 10*3/uL (ref 150–400)
RBC: 4.81 MIL/uL (ref 3.87–5.11)
RDW: 19.9 % — ABNORMAL HIGH (ref 11.5–15.5)
WBC: 11.1 10*3/uL — ABNORMAL HIGH (ref 4.0–10.5)
nRBC: 0 % (ref 0.0–0.2)

## 2022-06-15 LAB — CREATININE, SERUM
Creatinine, Ser: 0.71 mg/dL (ref 0.44–1.00)
GFR, Estimated: >60 mL/min (ref >60)

## 2022-06-15 LAB — POTASSIUM: Potassium: 3.1 mmol/L — ABNORMAL LOW (ref 3.5–5.1)

## 2022-06-15 LAB — HIV ANTIBODY (ROUTINE TESTING W REFLEX): HIV Screen 4th Generation wRfx: NONREACTIVE

## 2022-06-15 LAB — MRSA NEXT GEN BY PCR, NASAL: MRSA by PCR Next Gen: DETECTED — AB

## 2022-06-15 LAB — MAGNESIUM: Magnesium: 1.6 mg/dL — ABNORMAL LOW (ref 1.7–2.4)

## 2022-06-15 MED ORDER — FUROSEMIDE 40 MG PO TABS
40.0000 mg | ORAL_TABLET | Freq: Every day | ORAL | Status: DC
  Administered 2022-06-15 – 2022-06-20 (×6): 40 mg via ORAL
  Filled 2022-06-15 (×6): qty 1

## 2022-06-15 MED ORDER — POLYETHYLENE GLYCOL 3350 17 G PO PACK: 17.0000 g | PACK | Freq: Every day | ORAL | Status: DC | PRN

## 2022-06-15 MED ORDER — ACETAMINOPHEN 325 MG PO TABS
650.0000 mg | ORAL_TABLET | Freq: Four times a day (QID) | ORAL | Status: DC | PRN
  Administered 2022-06-17: 650 mg via ORAL
  Filled 2022-06-15 (×3): qty 2

## 2022-06-15 MED ORDER — ENOXAPARIN SODIUM 80 MG/0.8ML IJ SOSY
70.0000 mg | PREFILLED_SYRINGE | INTRAMUSCULAR | Status: DC
  Administered 2022-06-15 – 2022-06-20 (×6): 70 mg via SUBCUTANEOUS
  Filled 2022-06-15 (×6): qty 0.7

## 2022-06-15 MED ORDER — PRAMIPEXOLE DIHYDROCHLORIDE 0.125 MG PO TABS
0.1250 mg | ORAL_TABLET | Freq: Three times a day (TID) | ORAL | Status: AC
  Administered 2022-06-15 (×3): 0.125 mg via ORAL
  Filled 2022-06-15 (×4): qty 1

## 2022-06-15 MED ORDER — EZETIMIBE 10 MG PO TABS
10.0000 mg | ORAL_TABLET | Freq: Every day | ORAL | Status: DC
  Administered 2022-06-18 – 2022-06-20 (×3): 10 mg via ORAL
  Filled 2022-06-15 (×6): qty 1

## 2022-06-15 MED ORDER — PANTOPRAZOLE SODIUM 40 MG PO TBEC
40.0000 mg | DELAYED_RELEASE_TABLET | Freq: Every day | ORAL | Status: DC
  Administered 2022-06-15 – 2022-06-20 (×6): 40 mg via ORAL
  Filled 2022-06-15 (×6): qty 1

## 2022-06-15 MED ORDER — INSULIN ASPART 100 UNIT/ML IJ SOLN
0.0000 [IU] | Freq: Three times a day (TID) | INTRAMUSCULAR | Status: DC
  Administered 2022-06-15 (×2): 7 [IU] via SUBCUTANEOUS
  Administered 2022-06-16: 11 [IU] via SUBCUTANEOUS
  Administered 2022-06-16: 7 [IU] via SUBCUTANEOUS
  Administered 2022-06-16 – 2022-06-17 (×2): 11 [IU] via SUBCUTANEOUS
  Administered 2022-06-17: 15 [IU] via SUBCUTANEOUS
  Administered 2022-06-17: 11 [IU] via SUBCUTANEOUS
  Administered 2022-06-18: 15 [IU] via SUBCUTANEOUS
  Administered 2022-06-18 (×2): 7 [IU] via SUBCUTANEOUS
  Administered 2022-06-19: 4 [IU] via SUBCUTANEOUS
  Administered 2022-06-19 (×2): 7 [IU] via SUBCUTANEOUS
  Administered 2022-06-20: 11 [IU] via SUBCUTANEOUS
  Administered 2022-06-20: 7 [IU] via SUBCUTANEOUS

## 2022-06-15 MED ORDER — SACCHAROMYCES BOULARDII 250 MG PO CAPS
250.0000 mg | ORAL_CAPSULE | Freq: Three times a day (TID) | ORAL | Status: DC
  Administered 2022-06-15 – 2022-06-20 (×16): 250 mg via ORAL
  Filled 2022-06-15 (×16): qty 1

## 2022-06-15 MED ORDER — OXYCODONE HCL 5 MG PO TABS
20.0000 mg | ORAL_TABLET | Freq: Three times a day (TID) | ORAL | Status: DC | PRN
  Administered 2022-06-15 – 2022-06-20 (×14): 20 mg via ORAL
  Filled 2022-06-15 (×15): qty 4

## 2022-06-15 MED ORDER — GABAPENTIN 400 MG PO CAPS
400.0000 mg | ORAL_CAPSULE | Freq: Three times a day (TID) | ORAL | Status: DC
  Administered 2022-06-15 – 2022-06-16 (×4): 400 mg via ORAL
  Filled 2022-06-15 (×4): qty 1

## 2022-06-15 MED ORDER — POTASSIUM CHLORIDE 2 MEQ/ML IV SOLN
INTRAVENOUS | Status: AC
  Filled 2022-06-15: qty 1000

## 2022-06-15 MED ORDER — MOMETASONE FURO-FORMOTEROL FUM 100-5 MCG/ACT IN AERO
2.0000 | INHALATION_SPRAY | Freq: Two times a day (BID) | RESPIRATORY_TRACT | Status: DC
  Administered 2022-06-15 – 2022-06-20 (×10): 2 via RESPIRATORY_TRACT
  Filled 2022-06-15: qty 8.8

## 2022-06-15 MED ORDER — LORAZEPAM 1 MG PO TABS: 1.0000 mg | ORAL_TABLET | Freq: Once | ORAL | Status: DC

## 2022-06-15 MED ORDER — HYDROMORPHONE HCL 1 MG/ML IJ SOLN
0.5000 mg | Freq: Once | INTRAMUSCULAR | Status: AC
  Administered 2022-06-15: 0.5 mg via INTRAVENOUS
  Filled 2022-06-15: qty 0.5

## 2022-06-15 MED ORDER — LEVOTHYROXINE SODIUM 100 MCG PO TABS
200.0000 ug | ORAL_TABLET | Freq: Every day | ORAL | Status: DC
  Administered 2022-06-15 – 2022-06-20 (×6): 200 ug via ORAL
  Filled 2022-06-15 (×6): qty 2

## 2022-06-15 MED ORDER — GABAPENTIN 400 MG PO CAPS
400.0000 mg | ORAL_CAPSULE | Freq: Two times a day (BID) | ORAL | Status: DC
  Administered 2022-06-15: 400 mg via ORAL
  Filled 2022-06-15: qty 1

## 2022-06-15 MED ORDER — POTASSIUM CHLORIDE CRYS ER 20 MEQ PO TBCR
40.0000 meq | EXTENDED_RELEASE_TABLET | Freq: Once | ORAL | Status: AC
  Administered 2022-06-15: 40 meq via ORAL
  Filled 2022-06-15: qty 2

## 2022-06-15 MED ORDER — FERROUS GLUCONATE 324 (38 FE) MG PO TABS
324.0000 mg | ORAL_TABLET | Freq: Every day | ORAL | Status: DC
  Administered 2022-06-15 – 2022-06-20 (×6): 324 mg via ORAL
  Filled 2022-06-15 (×6): qty 1

## 2022-06-15 MED ORDER — LIDOCAINE 5 % EX PTCH
1.0000 | MEDICATED_PATCH | CUTANEOUS | Status: DC
  Administered 2022-06-15 – 2022-06-19 (×5): 1 via TRANSDERMAL
  Filled 2022-06-15 (×6): qty 1

## 2022-06-15 MED ORDER — PROCHLORPERAZINE EDISYLATE 10 MG/2ML IJ SOLN
5.0000 mg | Freq: Four times a day (QID) | INTRAMUSCULAR | Status: DC | PRN
  Administered 2022-06-15: 5 mg via INTRAVENOUS
  Filled 2022-06-15: qty 2

## 2022-06-15 MED ORDER — INSULIN ASPART 100 UNIT/ML IJ SOLN
0.0000 [IU] | Freq: Every day | INTRAMUSCULAR | Status: DC
  Administered 2022-06-15: 2 [IU] via SUBCUTANEOUS
  Administered 2022-06-16: 4 [IU] via SUBCUTANEOUS
  Administered 2022-06-17 – 2022-06-18 (×2): 3 [IU] via SUBCUTANEOUS
  Administered 2022-06-19: 2 [IU] via SUBCUTANEOUS

## 2022-06-15 MED ORDER — ORAL CARE MOUTH RINSE: 15.0000 mL | OROMUCOSAL | Status: DC | PRN

## 2022-06-15 MED ORDER — MAGNESIUM SULFATE 2 GM/50ML IV SOLN
2.0000 g | Freq: Once | INTRAVENOUS | Status: AC
  Administered 2022-06-15: 2 g via INTRAVENOUS
  Filled 2022-06-15: qty 50

## 2022-06-15 MED ORDER — MELATONIN 5 MG PO TABS
5.0000 mg | ORAL_TABLET | Freq: Every evening | ORAL | Status: DC | PRN
  Filled 2022-06-15 (×2): qty 1

## 2022-06-15 MED ORDER — OXYCODONE HCL 5 MG PO TABS
5.0000 mg | ORAL_TABLET | Freq: Four times a day (QID) | ORAL | Status: DC | PRN
  Administered 2022-06-17: 5 mg via ORAL
  Filled 2022-06-15 (×3): qty 1

## 2022-06-15 NOTE — Consult Note (Signed)
Reason for Consult:Left foot ulceration Referring Physician: Ali Lowe Time called: 0840 Time at bedside: 0933   [name] is an 61 y.o. female.  HPI: [name] has been suffering with a left foot ulcer for about a month. She and her husband have been using foam bandages but it has not been getting better. She saw her PCP who recommended she come to the ED and she was admitted. She has also had significant pain with this ulcer. She also admits to fevers, chills, and N/V though this is normal for her with her various ailments so not sure if related to her foot.  Past Medical History:  Diagnosis Date   Aortic stenosis    mild AS by echo 02/2021   Asthma    "on daily RX and rescue inhaler" (04/18/2018)   Chronic diastolic CHF (congestive heart failure) (HCC) 09/2015   Chronic lower back pain    Chronic neck pain    Chronic pain syndrome    Fentanyl Patch   Colon polyps    Coronary artery disease    cath with normal LM, 30% LAD, 85% mid RCA and 95% distal RCA s/p PCI of the mid to distal RCA and now on DAPT with ASA and Ticagrelor.     Eczema    Excessive daytime sleepiness 11/26/2015   Fibromyalgia    Gallstones    GERD (gastroesophageal reflux disease)    Heart murmur    "noted for the 1st time on 04/18/2018"   History of blood transfusion 07/2010   "S/P oophorectomy"   History of gout    History of hiatal hernia 1980s   "gone now" (04/18/2018)   Hyperlipidemia    Hypertension    takes Metoprolol and Enalapril daily   Hypothyroidism    takes Synthroid daily   IBS (irritable bowel syndrome)    Migraine    "nothing in the 2000s" (04/18/2018)   Mixed connective tissue disease (HCC)    NAFLD (nonalcoholic fatty liver disease)    Pneumonia    "several times" (04/18/2018)   PVC's (premature ventricular contractions)    noted on event monitor 02/2021   Rheumatoid arthritis (HCC)    "hands, elbows, shoulders, probably knees" (04/18/2018)   Scoliosis    Sjogren's syndrome (HCC)     Sleep apnea    mild - does not use cpap   Spondylosis    Type II diabetes mellitus (HCC)    takes Metformin and Hum R daily (04/18/2018)   Walker as ambulation aid    also uses wheelchair    Past Surgical History:  Procedure Laterality Date   ABDOMINAL HYSTERECTOMY  06/2005   "w/right ovariy"   AMPUTATION Right 01/28/2020    RIGHT FOURTH AND FIFTH RAY AMPUTATION   AMPUTATION Right 01/28/2020   Procedure: RIGHT FOURTH AND FIFTH RAY AMPUTATION,;  Surgeon: [name], MD;  Location: [facility] OR;  Service: Orthopedics;  Laterality: Right;   AMPUTATION Right 06/01/2021   Procedure: RIGHT BELOW KNEE AMPUTATION;  Surgeon: [name], MD;  Location: [facility] OR;  Service: Orthopedics;  Laterality: Right;   AMPUTATION Right 07/22/2021   Procedure: RIGHT ABOVE KNEE AMPUTATION;  Surgeon: [name], MD;  Location: [facility] OR;  Service: Orthopedics;  Laterality: Right;   APPENDECTOMY     BREAST BIOPSY Bilateral    9 total (04/18/2018)   CORONARY ANGIOPLASTY WITH STENT PLACEMENT  10/01/2015   normal LM, 30% LAD, 85% mid RCA and 95% distal RCA s/p PCI of the mid to  distal RCA and now on DAPT with ASA and Ticagrelor.     CORONARY STENT INTERVENTION Right 04/18/2018   Procedure: CORONARY STENT INTERVENTION;  Surgeon: [name], MD;  Location: [facility] INVAS[facility];  Service: Cardiovascular;  Laterality: Right;   DILATION AND CURETTAGE OF UTERUS     FRACTURE SURGERY     LAPAROSCOPIC CHOLECYSTECTOMY     LEFT HEART CATH AND CORONARY ANGIOGRAPHY N/A 04/18/2018   Procedure: LEFT HEART CATH AND CORONARY ANGIOGRAPHY;  Surgeon: [name], MD;  Location: [facility];  Service: Cardiovascular;  Laterality: N/A;   MUSCLE BIOPSY Left    "leg"   OOPHORECTOMY  07/2010   RADIOLOGY WITH ANESTHESIA N/A 05/25/2016   Procedure: RADIOLOGY WITH ANESTHESIA;  Surgeon: Medication Radiologist, MD;  Location: [facility] OR;  Service: Radiology;  Laterality: N/A;   STUMP REVISION Right 06/29/2021   Procedure:  REVISION RIGHT BELOW KNEE AMPUTATION;  Surgeon: [name], MD;  Location: [facility] OR;  Service: Orthopedics;  Laterality: Right;   TEE WITHOUT CARDIOVERSION N/A 06/01/2021   Procedure: TRANSESOPHAGEAL ECHOCARDIOGRAM (TEE);  Surgeon: Sande Rives, MD;  Location: [facility] OR;  Service: Cardiovascular;  Laterality: N/A;   TONSILLECTOMY AND ADENOIDECTOMY     WRIST FRACTURE SURGERY Left    "crushed it"    Family History  Problem Relation Age of Onset   CAD Mother    Hypertension Mother    Heart attack Mother    Stroke Mother    CAD Father    Heart attack Father    Allergic rhinitis Father    Asthma Father    Hypertension Brother    Hypertension Brother    Pancreatic cancer Paternal Aunt    Breast cancer Paternal Aunt    Lung cancer Paternal Aunt    Allergic rhinitis Son    Asthma Son     Social History:  reports that she quit smoking about 18 years ago. Her smoking use included cigarettes. She started smoking about 37 years ago. She has a 19.00 pack-year smoking history. She has been exposed to tobacco smoke. She has never used smokeless tobacco. She reports current alcohol use. She reports that she does not currently use drugs after having used the following drugs: Marijuana.  Allergies:  Allergies  Allergen Reactions   Fish Allergy Anaphylaxis    INCLUDES OMEGA 3 OILS   Fish Oil Anaphylaxis and Swelling    THROAT SWELLS INCLUDES FISH AS A CLASS   Omega-3 Fatty Acids Swelling    Throat swelling  Has tolerated Glucerna nutritional drinks    Crestor [Rosuvastatin] Other (See Comments)    Extreme joint pain, to this & other statins   Gabapentin Anxiety    Anxiety on high doses   Lovastatin Other (See Comments)    EXTREME JOINT PAIN   Metronidazole Nausea Only and Swelling    Headache and shakes Nausea and vomiting Pt tolerating IV 06/14/22   Red Yeast Rice [Cholestin] Other (See Comments)    Muscle pain and severe joint pain.    Rotigotine Swelling    (Neupro) Extreme  edema    Atrovent Hfa [Ipratropium Bromide Hfa] Other (See Comments)    wheezing   Iodine Swelling   Other Other (See Comments)    Surgical Staples causes redness/infection per patient.   Pepto-Bismol [Bismuth Subsalicylate] Nausea And Vomiting    Extreme vomiting   Victoza [Liraglutide]     Unsure of reaction    Enalapril Cough   Suprep [Na Sulfate-K Sulfate-Mg Sulf]  Nausea And Vomiting   Tape Other (See Comments)    Electrodes causes skin breakdown    Medications: I have reviewed the patient's current medications.  Results for orders placed or performed during the hospital encounter of 06/14/22 (from the past 48 hour(s))  Blood culture (routine x 2)     Status: None (Preliminary result)   Collection Time: 06/14/22  4:58 PM   Specimen: BLOOD  Result Value Ref Range   Specimen Description      BLOOD LEFT ANTECUBITAL Performed at [facility], [address] [place], [place] [address]    Special Requests      BOTTLES DRAWN AEROBIC AND ANAEROBIC Blood Culture adequate volume Performed at [facility], [address] [place], [place] [address]    Culture      NO GROWTH < 12 HOURS Performed at [facility], [address], [place], [place] [address]    Report Status PENDING   Blood culture (routine x 2)     Status: None (Preliminary result)   Collection Time: 06/14/22  5:02 PM   Specimen: BLOOD  Result Value Ref Range   Specimen Description      BLOOD RIGHT ANTECUBITAL Performed at [facility], [address] [place], [place] [address]    Special Requests      BOTTLES DRAWN AEROBIC AND ANAEROBIC Blood Culture results may not be optimal due to an excessive volume of blood received in culture bottles Performed at [facility], [address] [place], [place] [address]    Culture      NO GROWTH < 12 HOURS Performed at [facility], [address] [place], [place] [address]    Report Status PENDING   Lactic  acid, plasma     Status: Abnormal   Collection Time: 06/14/22  5:05 PM  Result Value Ref Range   Lactic Acid, Venous 3.2 (HH) 0.5 - 1.9 mmol/L    Comment: CRITICAL RESULT CALLED TO, READ BACK BY AND VERIFIED WITH B. [facility] RN AT 1746 ON 06/14/22 BY [email or web address] Performed at [facility], [address] [place], [place] [address]   Comprehensive metabolic panel     Status: Abnormal   Collection Time: 06/14/22  5:05 PM  Result Value Ref Range   Sodium 139 135 - 145 mmol/L   Potassium 3.1 (L) 3.5 - 5.1 mmol/L   Chloride 101 98 - 111 mmol/L   CO2 28 22 - 32 mmol/L   Glucose, Bld 124 (H) 70 - 99 mg/dL    Comment: Glucose reference range applies only to samples taken after fasting for at least 8 hours.   BUN 11 6 - 20 mg/dL   Creatinine, Ser 1.66 0.44 - 1.00 mg/dL   Calcium 8.7 (L) 8.9 - 10.3 mg/dL   Total Protein 7.4 6.5 - 8.1 g/dL   Albumin 3.0 (L) 3.5 - 5.0 g/dL   AST 32 15 - 41 U/L   ALT 22 0 - 44 U/L   Alkaline Phosphatase 69 38 - 126 U/L   Total Bilirubin 0.3 0.3 - 1.2 mg/dL   GFR, Estimated >06 >30 mL/min    Comment: (NOTE) Calculated using the CKD-EPI Creatinine Equation (2021)    Anion gap 10 5 - 15    Comment: Performed at [facility], [address] [place], [place] [address]  Lactic acid, plasma     Status: None  Collection Time: 06/14/22  7:01 PM  Result Value Ref Range   Lactic Acid, Venous 1.4 0.5 - 1.9 mmol/L    Comment: Performed at [facility], [address] [place], [place] [address]  CBC     Status: Abnormal   Collection Time: 06/15/22  2:12 AM  Result Value Ref Range   WBC 11.1 (H) 4.0 - 10.5 K/uL   RBC 4.81 3.87 - 5.11 MIL/uL   Hemoglobin 8.9 (L) 12.0 - 15.0 g/dL    Comment: Reticulocyte Hemoglobin testing may be clinically indicated, consider ordering this additional test [id number]    HCT 32.8 (L) 36.0 - 46.0 %   MCV 68.2 (L) 80.0 - 100.0 fL   MCH 18.5 (L) 26.0 - 34.0 pg   MCHC 27.1 (L) 30.0 - 36.0 g/dL   RDW 98.3  (H) 38.2 - 15.5 %   Platelets 302 150 - 400 K/uL    Comment: REPEATED TO VERIFY   nRBC 0.0 0.0 - 0.2 %    Comment: Performed at [facility], [address] [place], [place] [address]  Creatinine, serum     Status: None   Collection Time: 06/15/22  2:12 AM  Result Value Ref Range   Creatinine, Ser 0.71 0.44 - 1.00 mg/dL   GFR, Estimated >76 >73 mL/min    Comment: (NOTE) Calculated using the CKD-EPI Creatinine Equation (2021) Performed at [facility], [address], [place], [place] [address]   Potassium     Status: Abnormal   Collection Time: 06/15/22  2:12 AM  Result Value Ref Range   Potassium 3.1 (L) 3.5 - 5.1 mmol/L    Comment: Performed at [facility], [address] [place], [place] [address]  Magnesium     Status: Abnormal   Collection Time: 06/15/22  2:12 AM  Result Value Ref Range   Magnesium 1.6 (L) 1.7 - 2.4 mg/dL    Comment: Performed at [facility], [address] [place], [place] [address]  MRSA Next Gen by PCR, Nasal     Status: Abnormal   Collection Time: 06/15/22  4:44 AM   Specimen: Nasal Mucosa; Nasal Swab  Result Value Ref Range   MRSA by PCR Next Gen DETECTED (A) NOT DETECTED    Comment: CRITICAL RESULT CALLED TO, READ BACK BY AND VERIFIED WITH:  C/ ABRAHAM O., RN 06/15/22 2992 A. LAFRANCE (NOTE) The GeneXpert MRSA Assay (FDA approved for NASAL specimens only), is one component of a comprehensive MRSA colonization surveillance program. It is not intended to diagnose MRSA infection nor to guide or monitor treatment for MRSA infections. Test performance is not FDA approved in patients less than 12 years old. Performed at [facility], [address] [place], [place] [address]   Glucose, capillary     Status: Abnormal   Collection Time: 06/15/22  9:10 AM  Result Value Ref Range   Glucose-Capillary 218 (H) 70 - 99 mg/dL    Comment: Glucose reference range applies only to samples taken after fasting for at least 8  hours.    DG Foot Complete Left  Result Date: 06/14/2022 CLINICAL DATA:  Wound at bottom of LEFT foot for 4 weeks, question osteomyelitis; history of gout and diabetes mellitus EXAM: LEFT FOOT - COMPLETE 3+ VIEW COMPARISON:  05/26/2021 FINDINGS: Osseous demineralization. Old healed posttraumatic deformity distal fifth metatarsal. Joint spaces preserved. Plantar calcaneal spur. No acute fracture, dislocation, or bone destruction. No definite soft tissue gas.  IMPRESSION: No acute osseous abnormalities. Osseous demineralization with healed posttraumatic deformity of distal fifth metatarsal. Electronically Signed   By: Lavonia Dana M.D.   On: 06/14/2022 17:03    Review of Systems  Constitutional:  Positive for diaphoresis and fever. Negative for chills.  HENT:  Negative for ear discharge, ear pain, hearing loss and tinnitus.   Eyes:  Negative for photophobia and pain.  Respiratory:  Negative for cough and shortness of breath.   Cardiovascular:  Negative for chest pain.  Gastrointestinal:  Positive for nausea and vomiting. Negative for abdominal pain.  Genitourinary:  Negative for dysuria, flank pain, frequency and urgency.  Musculoskeletal:  Positive for arthralgias (Left foot). Negative for back pain, myalgias and neck pain.  Skin:  Positive for wound (Left foot).  Neurological:  Negative for dizziness and headaches.  Hematological:  Does not bruise/bleed easily.  Psychiatric/Behavioral:  The patient is not nervous/anxious.    Blood pressure 138/71, pulse 94, temperature 98.9 F (37.2 C), resp. rate 18, height 5\' 2"  (1.575 m), weight (!) 140.2 kg, SpO2 91 %. Physical Exam Constitutional:      General: She is not in acute distress.    Appearance: She is well-developed. She is not diaphoretic.  HENT:     Head: Normocephalic and atraumatic.  Eyes:     General: No scleral icterus.       Right eye: No discharge.        Left eye: No discharge.     Conjunctiva/sclera: Conjunctivae normal.   Cardiovascular:     Rate and Rhythm: Normal rate and regular rhythm.  Pulmonary:     Effort: Pulmonary effort is normal. No respiratory distress.  Musculoskeletal:     Cervical back: Normal range of motion.  Feet:     Comments: Left foot: Cellulitic mid-calf to ankle. Large necrotic ulceration centered over 5th MTP joint. No discharge but faint foul odor. 2+ DP, 0 PT. Skin:    General: Skin is warm and dry.  Neurological:     Mental Status: She is alert.  Psychiatric:        Mood and Affect: Mood normal.        Behavior: Behavior normal.     Assessment/Plan: Left foot ulcer -- Will see what MRI shows. If osteo present will likely need BKA. If not would recommend surgical debridement. Do not feel we need to repeat ABI as done 16m ago and still with good DP pulse.     Lisette Abu, PA-C Orthopedic Surgery [phone] 06/15/2022, 9:43 AM

## 2022-06-15 NOTE — H&P (Signed)
History and Physical  [name] RCV:[id number] DOB: 09/20/1960 DOA: 06/14/2022  Referring physician: Accepted by Dr. Trilby Drummer, [facility] PCP: Shelda Pal, DO  Outpatient Specialists: Rheumatology, cardiology. Patient coming from: Home through [facility] ED.  Chief Complaint: Diabetic foot infection with abnormal labs from PCP.  HPI: [name] is a 61 y.o. female with medical history significant for severe morbid obesity, type 2 diabetes, post right above-the-knee amputation, IBS-D, hypertension, hyperlipidemia, hypothyroidism, rheumatoid arthritis, asthma, who initially presented to [facility] ED, at her PCPs recommendation due to left foot diabetic wound infection and abnormal labs results.  The patient had been monitored for left foot wound for the past 4 weeks.  She was found to have elevated white count on routine labs, with severely elevated ESR.  Associated with left foot wound, pain, and foul smell.  She was advised to go to the ED for further evaluation, and to rule out osteomyelitis.  In the ED, she was started on empiric IV antibiotics.  MRSA screen is pending.  TRH, hospitalist service, was asked to admit.  ED Course: Tmax 98.4.  BP 117/71, pulse 90, respiratory 17, O2 saturation 98% on 3 L.  Lab studies remarkable for hemoglobin 9.3, WBC 11.3, MCV 63, platelet count 325.  Serum potassium 3.1, glucose 124, albumin 3.0.  Review of Systems: Review of systems as noted in the HPI. All other systems reviewed and are negative.   Past Medical History:  Diagnosis Date   Aortic stenosis    mild AS by echo 02/2021   Asthma    "on daily RX and rescue inhaler" (04/18/2018)   Chronic diastolic CHF (congestive heart failure) ([place]) 09/2015   Chronic lower back pain    Chronic neck pain    Chronic pain syndrome    Fentanyl Patch   Colon polyps    Coronary artery disease    cath with normal LM, 30% LAD, 85% mid RCA and 95% distal RCA s/p PCI of the mid to distal RCA and now on DAPT with  ASA and Ticagrelor.     Eczema    Excessive daytime sleepiness 11/26/2015   Fibromyalgia    Gallstones    GERD (gastroesophageal reflux disease)    Heart murmur    "noted for the 1st time on 04/18/2018"   History of blood transfusion 07/2010   "S/P oophorectomy"   History of gout    History of hiatal hernia 1980s   "gone now" (04/18/2018)   Hyperlipidemia    Hypertension    takes Metoprolol and Enalapril daily   Hypothyroidism    takes Synthroid daily   IBS (irritable bowel syndrome)    Migraine    "nothing in the 2000s" (04/18/2018)   Mixed connective tissue disease ([place])    NAFLD (nonalcoholic fatty liver disease)    Pneumonia    "several times" (04/18/2018)   PVC's (premature ventricular contractions)    noted on event monitor 02/2021   Rheumatoid arthritis ([place])    "hands, elbows, shoulders, probably knees" (04/18/2018)   Scoliosis    Sjogren's syndrome ([place])    Sleep apnea    mild - does not use cpap   Spondylosis    Type II diabetes mellitus ([place])    takes Metformin and Hum R daily (04/18/2018)   Walker as ambulation aid    also uses wheelchair   Past Surgical History:  Procedure Laterality Date   ABDOMINAL HYSTERECTOMY  06/2005   "w/right ovariy"   AMPUTATION Right 01/28/2020    RIGHT  FOURTH AND FIFTH RAY AMPUTATION   AMPUTATION Right 01/28/2020   Procedure: RIGHT FOURTH AND FIFTH RAY AMPUTATION,;  Surgeon: Newt Minion, MD;  Location: [place];  Service: Orthopedics;  Laterality: Right;   AMPUTATION Right 06/01/2021   Procedure: RIGHT BELOW KNEE AMPUTATION;  Surgeon: Newt Minion, MD;  Location: [place];  Service: Orthopedics;  Laterality: Right;   AMPUTATION Right 07/22/2021   Procedure: RIGHT ABOVE KNEE AMPUTATION;  Surgeon: Newt Minion, MD;  Location: [place];  Service: Orthopedics;  Laterality: Right;   APPENDECTOMY     BREAST BIOPSY Bilateral    9 total (04/18/2018)   CORONARY ANGIOPLASTY WITH STENT PLACEMENT  10/01/2015   normal LM, 30% LAD, 85% mid RCA and 95%  distal RCA s/p PCI of the mid to distal RCA and now on DAPT with ASA and Ticagrelor.     CORONARY STENT INTERVENTION Right 04/18/2018   Procedure: CORONARY STENT INTERVENTION;  Surgeon: Burnell Blanks, MD;  Location: [place] [facility];  Service: Cardiovascular;  Laterality: Right;   DILATION AND CURETTAGE OF UTERUS     FRACTURE SURGERY     LAPAROSCOPIC CHOLECYSTECTOMY     LEFT HEART CATH AND CORONARY ANGIOGRAPHY N/A 04/18/2018   Procedure: LEFT HEART CATH AND CORONARY ANGIOGRAPHY;  Surgeon: Burnell Blanks, MD;  Location: [place] [facility];  Service: Cardiovascular;  Laterality: N/A;   MUSCLE BIOPSY Left    "leg"   OOPHORECTOMY  07/2010   RADIOLOGY WITH ANESTHESIA N/A 05/25/2016   Procedure: RADIOLOGY WITH ANESTHESIA;  Surgeon: Medication Radiologist, MD;  Location: [place];  Service: Radiology;  Laterality: N/A;   STUMP REVISION Right 06/29/2021   Procedure: REVISION RIGHT BELOW KNEE AMPUTATION;  Surgeon: Newt Minion, MD;  Location: [place];  Service: Orthopedics;  Laterality: Right;   TEE WITHOUT CARDIOVERSION N/A 06/01/2021   Procedure: TRANSESOPHAGEAL ECHOCARDIOGRAM (TEE);  Surgeon: Geralynn Rile, MD;  Location: [place];  Service: Cardiovascular;  Laterality: N/A;   TONSILLECTOMY AND ADENOIDECTOMY     WRIST FRACTURE SURGERY Left    "crushed it"    Social History:  reports that she quit smoking about 18 years ago. Her smoking use included cigarettes. She started smoking about 37 years ago. She has a 19.00 pack-year smoking history. She has been exposed to tobacco smoke. She has never used smokeless tobacco. She reports current alcohol use. She reports that she does not currently use drugs after having used the following drugs: Marijuana.   Allergies  Allergen Reactions   Fish Allergy Anaphylaxis    INCLUDES OMEGA 3 OILS   Fish Oil Anaphylaxis and Swelling    THROAT SWELLS INCLUDES FISH AS A CLASS   Omega-3 Fatty Acids Swelling    Throat swelling  Has tolerated  Glucerna nutritional drinks    Crestor [Rosuvastatin] Other (See Comments)    Extreme joint pain, to this & other statins   Gabapentin Anxiety    Anxiety on high doses   Lovastatin Other (See Comments)    EXTREME JOINT PAIN   Metronidazole Nausea Only and Swelling    Headache and shakes Nausea and vomiting Pt tolerating IV 06/14/22   Red Yeast Rice [Cholestin] Other (See Comments)    Muscle pain and severe joint pain.    Rotigotine Swelling    (Neupro) Extreme edema    Atrovent Hfa [Ipratropium Bromide Hfa] Other (See Comments)    wheezing   Iodine Swelling   Other Other (See Comments)    Surgical Staples causes redness/infection  per patient.   Pepto-Bismol [Bismuth Subsalicylate] Nausea And Vomiting    Extreme vomiting   Victoza [Liraglutide]     Unsure of reaction    Enalapril Cough   Suprep [Na Sulfate-K Sulfate-Mg Sulf] Nausea And Vomiting   Tape Other (See Comments)    Electrodes causes skin breakdown    Family History  Problem Relation Age of Onset   CAD Mother    Hypertension Mother    Heart attack Mother    Stroke Mother    CAD Father    Heart attack Father    Allergic rhinitis Father    Asthma Father    Hypertension Brother    Hypertension Brother    Pancreatic cancer Paternal Aunt    Breast cancer Paternal 46    Lung cancer Paternal Aunt    Allergic rhinitis Son    Asthma Son       Prior to Admission medications   Medication Sig Start Date End Date Taking? Authorizing Provider  acetaminophen (TYLENOL) 500 MG tablet Take 500 mg by mouth every 8 (eight) hours as needed.    [provider]  albuterol (PROVENTIL) (2.5 MG/3ML) 0.083% nebulizer solution USE 1 VIAL IN NEBULIZER EVERY 4 HOURS AS NEEDED FOR WHEEZING FOR SHORTNESS OF BREATH 08/23/21   Wendling, Crosby Oyster, DO  ascorbic acid (VITAMIN C) 500 MG tablet Take 1 tablet (500 mg total) by mouth 2 (two) times daily. 06/24/21   Love, Ivan Anchors, PA-C  aspirin EC 81 MG tablet Take 1 tablet (81  mg total) by mouth every evening. 10/17/21   Shelda Pal, DO  augmented betamethasone dipropionate (DIPROLENE AF) 0.05 % cream Apply topically 2 (two) times daily. 11/16/21   Shelda Pal, DO  cyclobenzaprine (FLEXERIL) 10 MG tablet Take 10 mg by mouth 3 (three) times daily as needed for muscle spasms.    [provider]  dapagliflozin propanediol (FARXIGA) 10 MG TABS tablet Take 1 tablet (10 mg total) by mouth daily before breakfast. 05/10/22   Wendling, Crosby Oyster, DO  diclofenac Sodium (VOLTAREN) 1 % GEL Apply 2 g topically 4 (four) times daily. 06/24/21   Love, Ivan Anchors, PA-C  DILT-XR 240 MG 24 hr capsule Take 1 capsule (240 mg total) by mouth daily. 04/18/22   Sueanne Margarita, MD  doxycycline (VIBRA-TABS) 100 MG tablet Take 1 tablet (100 mg total) by mouth 2 (two) times daily. 06/14/22   Wendling, Crosby Oyster, DO  EPINEPHrine (EPIPEN 2-PAK) 0.3 mg/0.3 mL IJ SOAJ injection USE AS DIRECTED FOR SEVERE ALLERGIC REACTION. 08/24/21   Shelda Pal, DO  ezetimibe (ZETIA) 10 MG tablet Take 1 tablet (10 mg total) by mouth daily. 09/21/21   Shelda Pal, DO  fluticasone (FLONASE) 50 MCG/ACT nasal spray USE 2 SPRAY(S) IN EACH NOSTRIL ONCE DAILY AS NEEDED FOR ALLERGIES OR  RHINITIS 04/17/22   Shelda Pal, DO  furosemide (LASIX) 20 MG tablet Take 2 tablets by mouth once daily 06/07/22   Nani Ravens, Crosby Oyster, DO  gabapentin (NEURONTIN) 400 MG capsule TAKE 1 CAPSULE BY MOUTH IN THE MORNING AND 1 AT [facility] AND 2 AT BEDTIME 05/09/22   Wendling, Crosby Oyster, DO  GLUCOMANNAN PO Take 1,995 mg by mouth in the morning, at noon, and at bedtime.    [provider]  glucose blood test strip 1 each by Other route 4 (four) times daily. Contour Next strips 06/24/21   Love, Ivan Anchors, PA-C  guaiFENesin (MUCINEX) 600 MG 12 hr tablet Take by mouth  2 (two) times daily as needed.    [provider]  hydrocortisone cream 1 % Apply 1 application topically  3 (three) times daily as needed for itching (minor skin irritation). 06/24/21   Love, Ivan Anchors, PA-C  hydroxychloroquine (PLAQUENIL) 200 MG tablet Take $RemoveBef'200mg'mMTqRVixOV$  by mouth twice daily Monday through Friday only. None on Saturday or Sunday. 05/09/22   Bo Merino, MD  insulin regular human CONCENTRATED (HUMULIN R) 500 UNIT/ML injection INJECT 0.08-0.3 MLS (40-150 UNITS TOTAL) INTO THE SKIN THREE TIMES DAILY WITH MEALS 01/11/22   Wendling, Crosby Oyster, DO  INSULIN SYRINGE 1CC/29G (B-D INSULIN SYRINGE) 29G X 1/2" 1 ML MISC Use three times daily with insulin.  DX E11.9 06/11/20   Shelda Pal, DO  isosorbide mononitrate (IMDUR) 30 MG 24 hr tablet Take 1 tablet by mouth once daily 05/09/22   Sueanne Margarita, MD  levocetirizine (XYZAL) 5 MG tablet TAKE 1 TABLET BY MOUTH ONCE DAILY IN THE EVENING 05/09/22   Wendling, Crosby Oyster, DO  levothyroxine (SYNTHROID) 200 MCG tablet TAKE 1 TABLET BY MOUTH ONCE DAILY BEFORE BREAKFAST 05/09/22   Shelda Pal, DO  metFORMIN (GLUCOPHAGE-XR) 500 MG 24 hr tablet Take 2 tablets by mouth twice daily 05/09/22   Wendling, Crosby Oyster, DO  methocarbamol (ROBAXIN) 750 MG tablet Take 1 tablet (750 mg total) by mouth every 8 (eight) hours as needed for muscle spasms. 12/27/21   Domenic Polite, MD  mometasone-formoterol [facility]) 100-5 MCG/ACT AERO Inhale 2 puffs into the lungs 2 (two) times daily.    [provider]  Multiple Vitamins-Minerals (WOMENS 50+ MULTI VITAMIN PO) Take 1 tablet by mouth daily.    [provider]  mupirocin ointment (BACTROBAN) 2 % Apply 1 application topically 2 (two) times daily as needed (wound care). 11/14/21   Shelda Pal, DO  naloxone [facility]) nasal spray 4 mg/0.1 mL Place 1 spray into the nose as needed (opoid overdose).    [provider]  nitroGLYCERIN (NITROSTAT) 0.4 MG SL tablet DISSOLVE ONE TABLET UNDER THE TONGUE EVERY 5 MINUTES AS NEEDED FOR CHEST PAIN.  DO NOT EXCEED A TOTAL OF 3 DOSES  IN 15 MINUTES 08/23/21   Turner, Eber Hong, MD  nystatin (MYCOSTATIN/NYSTOP) powder Apply 1 Application topically 3 (three) times daily. RLE 03/17/22   Shelda Pal, DO  omeprazole (PRILOSEC) 40 MG capsule Take 1 capsule by mouth once daily 05/09/22   Shelda Pal, DO  OVER THE COUNTER MEDICATION 2 (two) times a week. Zinc Wraps for legs    [provider]  Oxycodone HCl 20 MG TABS Take 1 tablet (20 mg total) by mouth every 8 (eight) hours as needed. 05/02/22   Bayard Hugger, NP  oxymetazoline (AFRIN) 0.05 % nasal spray Place 2 sprays into both nostrils 2 (two) times daily as needed.    [provider]  Polyethyl Glycol-Propyl Glycol (SYSTANE) 0.4-0.3 % SOLN Place 1-2 drops into both eyes in the morning.    [provider]  pramipexole (MIRAPEX) 1 MG tablet TAKE 1 TABLET BY MOUTH THREE TIMES DAILY AS NEEDED AND 1/2 (ONE-HALF) TABLET TWICE DAILY AS NEEDED FOR  BREAKTHROUGH  RESTLESS  LEG 06/07/22   Wendling, Crosby Oyster, DO  pregabalin (LYRICA) 50 MG capsule Take 1 capsule (50 mg total) by mouth 3 (three) times daily. 02/27/22   Raulkar, Clide Deutscher, MD  promethazine (PHENERGAN) 25 MG tablet Take 1 tablet (25 mg total) by mouth every 8 (eight) hours as needed for vomiting or nausea.  07/05/21   Shelda Pal, DO  saccharomyces boulardii (FLORASTOR) 250 MG capsule Take 250 mg by mouth 3 (three) times daily.    [provider]  triamcinolone cream (KENALOG) 0.1 % Apply 1 Application topically 3 (three) times daily. RLE 03/17/22   Shelda Pal, DO  VENTOLIN HFA 108 (90 Base) MCG/ACT inhaler INHALE 2 PUFFS BY MOUTH EVERY 4 HOURS AS NEEDED FOR WHEEZING AND  FOR  SHORTNESS  OF  BREATH 05/09/22   Wendling, Crosby Oyster, DO  Vitamin D, Ergocalciferol, (DRISDOL) 1.25 MG (50000 UNIT) CAPS capsule TAKE 1 CAPSULE BY MOUTH ONCE A WEEK **TAKE  ON  FRIDAY** 05/09/22   Wendling, Crosby Oyster, DO  ZINC OXIDE, TOPICAL, 10 % CREA Apply topically.     [provider]  ZINC SULFATE PO Take 255 mg by mouth daily.    [provider]    Physical Exam: BP 117/71 (BP Location: Left Arm)   Pulse 90   Temp 97.8 F (36.6 C) (Oral)   Resp 17   Ht $R'5\' 2"'Cx$  (1.575 m)   Wt (!) 140.2 kg   SpO2 98%   BMI 56.52 kg/m   General: 61 y.o. year-old female well developed well nourished in no acute distress.  Alert and oriented x3. Cardiovascular: Regular rate and rhythm with no rubs or gallops.  No thyromegaly or JVD noted.  1+ pitting edema lower extremity.  Right above-the-knee amputation. Respiratory: Clear to auscultation with no wheezes or rales. Good inspiratory effort. Abdomen: Soft nontender nondistended with normal bowel sounds x4 quadrants. Muskuloskeletal: No cyanosis or clubbing.  1+ pitting edema in left lower extremity.  Right above-the-knee amputation. Neuro: CN II-XII intact, strength, sensation, reflexes Skin: Left foot wound plantar region. Psychiatry: Judgement and insight appear normal. Mood is appropriate for condition and setting          Labs on Admission:  Basic Metabolic Panel: Recent Labs  Lab 06/14/22 1705  NA 139  K 3.1*  CL 101  CO2 28  GLUCOSE 124*  BUN 11  CREATININE 0.77  CALCIUM 8.7*   Liver Function Tests: Recent Labs  Lab 06/14/22 1705  AST 32  ALT 22  ALKPHOS 69  BILITOT 0.3  PROT 7.4  ALBUMIN 3.0*   No results for input(s): "LIPASE", "AMYLASE" in the last 168 hours. No results for input(s): "AMMONIA" in the last 168 hours. CBC: Recent Labs  Lab 06/14/22 0947  WBC 11.3*  HGB 9.3*  HCT 32.2*  MCV 63.7 aL*  PLT 335.0   Cardiac Enzymes: No results for input(s): "CKTOTAL", "CKMB", "CKMBINDEX", "TROPONINI" in the last 168 hours.  BNP (last 3 results) Recent Labs    12/22/21 0304 01/09/22 1011  BNP 7.8 19.3    ProBNP (last 3 results) No results for input(s): "PROBNP" in the last 8760 hours.  CBG: No results for input(s): "GLUCAP" in the last 168  hours.  Radiological Exams on Admission: DG Foot Complete Left  Result Date: 06/14/2022 CLINICAL DATA:  Wound at bottom of LEFT foot for 4 weeks, question osteomyelitis; history of gout and diabetes mellitus EXAM: LEFT FOOT - COMPLETE 3+ VIEW COMPARISON:  05/26/2021 FINDINGS: Osseous demineralization. Old healed posttraumatic deformity distal fifth metatarsal. Joint spaces preserved. Plantar calcaneal spur. No acute fracture, dislocation, or bone destruction. No definite soft tissue gas. IMPRESSION: No acute osseous abnormalities. Osseous demineralization with healed posttraumatic deformity of distal fifth metatarsal. Electronically Signed   By: Lavonia Dana M.D.   On: 06/14/2022 17:03    EKG:  I independently viewed the EKG done and my findings are as followed: None available at the time of this visit.  Assessment/Plan Present on Admission:  Diabetic foot infection ([place])  Principal Problem:   Diabetic foot infection ([place])  Left foot diabetic wound infection, POA Concern for possible osteomyelitis. ESR significantly elevated CRP within normal limit. Follow blood cultures for ID and sensitivities As needed analgesics with bowel regimen. MRI left foot to rule out osteomyelitis  Acute on chronic blood loss anemia in the setting of chronic left foot wound Iron deficiency anemia-iron studies done on 12/22/2021. Baseline hemoglobin appears to be 11 Presented with hemoglobin of 9.3 and MCV of 63 Start ferrous sulfate 325 mg daily. Continue to monitor H&H Transfuse as indicated.  Type 2 diabetes with hyperglycemia Hemoglobin A1c 9.2 on 05/26/2022 Start insulin sliding scale  Hypokalemia Serum potassium 3.1 Repleted orally Repeat chemistry panel in the morning Obtain magnesium level.  Severe morbid obesity BMI 56 Recommend weight loss outpatient with regular physical activity and healthy dieting.   DVT prophylaxis: Subcu Lovenox daily  Code Status: Full code  Family  Communication: None at bedside  Disposition Plan: Admitted to progressive care unit, accepted by Dr. Trilby Drummer.  Consults called: None  Admission status: Observation status   Status is: Observation    Kayleen Memos MD [facility] Pager [phone]  If 7PM-7AM, please contact night-coverage [email or web address] Password [email or web address]  06/15/2022, 1:20 AM

## 2022-06-15 NOTE — Plan of Care (Signed)
  Problem: Education: Goal: Knowledge of General Education information will improve Description: Including pain rating scale, medication(s)/side effects and non-pharmacologic comfort measures Outcome: Progressing   Problem: Health Behavior/Discharge Planning: Goal: Ability to manage health-related needs will improve Outcome: Progressing   Problem: Clinical Measurements: Goal: Ability to maintain clinical measurements within normal limits will improve Outcome: Progressing Goal: Will remain free from infection Outcome: Progressing Goal: Diagnostic test results will improve Outcome: Progressing Goal: Respiratory complications will improve Outcome: Progressing Goal: Cardiovascular complication will be avoided Outcome: Progressing   Problem: Activity: Goal: Risk for activity intolerance will decrease Outcome: Progressing   Problem: Nutrition: Goal: Adequate nutrition will be maintained Outcome: Progressing   Problem: Coping: Goal: Level of anxiety will decrease Outcome: Progressing   Problem: Elimination: Goal: Will not experience complications related to bowel motility Outcome: Progressing Goal: Will not experience complications related to urinary retention Outcome: Progressing   Problem: Safety: Goal: Ability to remain free from injury will improve Outcome: Progressing   Problem: Skin Integrity: Goal: Risk for impaired skin integrity will decrease Outcome: Progressing   Problem: Education: Goal: Ability to describe self-care measures that may prevent or decrease complications (Diabetes Survival Skills Education) will improve Outcome: Progressing Goal: Individualized Educational Video(s) Outcome: Progressing   Problem: Coping: Goal: Ability to adjust to condition or change in health will improve Outcome: Progressing   Problem: Fluid Volume: Goal: Ability to maintain a balanced intake and output will improve Outcome: Progressing   Problem: Health  Behavior/Discharge Planning: Goal: Ability to identify and utilize available resources and services will improve Outcome: Progressing Goal: Ability to manage health-related needs will improve Outcome: Progressing   Problem: Metabolic: Goal: Ability to maintain appropriate glucose levels will improve Outcome: Progressing   Problem: Nutritional: Goal: Maintenance of adequate nutrition will improve Outcome: Progressing Goal: Progress toward achieving an optimal weight will improve Outcome: Progressing   Problem: Skin Integrity: Goal: Risk for impaired skin integrity will decrease Outcome: Progressing   Problem: Tissue Perfusion: Goal: Adequacy of tissue perfusion will improve Outcome: Progressing   Problem: Pain Managment: Goal: General experience of comfort will improve Outcome: Not Progressing

## 2022-06-15 NOTE — Progress Notes (Signed)
Brief same day note:  Patient is a 61 year old female with history of morbid obesity, diabetes type 2, right AKA, irritable bowel syndrome, hypertension, hyperlipidemia, hypothyroidism, rheumatoid arthritis, asthma who was admitted for the concern of left foot diabetic wound infection, abnormal labs.  She was being monitored for left foot infection for last 4 weeks, lab work done as an outpatient showed leukocytosis, elevated ESR, patient was also having severe left foot pain, foul smell so she was advised to go to the emergency department.  She has been started antibiotics for antibiotics.  Culture sent.  Orthopedics consulted.Pending MRI  Assessment and plan:  Left foot diabetic wound infection: Concern for osteomyelitis.  Presented with fall as well, left foot pain, elevated ESR.  Cultures have been sent.  Continue broad spectrum antibiotics.  MRI pending. Orthopedics following.  Acute on chronic blood loss anemia: In the setting of chronic left foot wound.  Has evidence of iron deficiency anemia as per iron studies done on 12/22/2021.  Baseline hemoglobin around 11.  Currently hemoglobin in the range of 9.  Started on iron supplementation.  Continue to monitor CBC  Diabetes type 2: Recent A1c of 9.2.  Continue sliding scale insulin.  Monitor blood sugars  Hypokalemia/hypomagnesemia: Continue monitoring and supplementation  Severe morbid obesity: BMI of 56  Hypothyroidism: TSH of 9.4.  Takes levothyroxine at maximum dose.  We recommend to follow-up in 4 to 6 weeks.  Hyperlipidemia: On ezitimibe

## 2022-06-16 DIAGNOSIS — E11628 Type 2 diabetes mellitus with other skin complications: Secondary | ICD-10-CM | POA: Diagnosis not present

## 2022-06-16 DIAGNOSIS — L089 Local infection of the skin and subcutaneous tissue, unspecified: Secondary | ICD-10-CM | POA: Diagnosis not present

## 2022-06-16 LAB — CBC
HCT: 32.2 % — ABNORMAL LOW (ref 36.0–46.0)
Hemoglobin: 8.8 g/dL — ABNORMAL LOW (ref 12.0–15.0)
MCH: 18.6 pg — ABNORMAL LOW (ref 26.0–34.0)
MCHC: 27.3 g/dL — ABNORMAL LOW (ref 30.0–36.0)
MCV: 67.9 fL — ABNORMAL LOW (ref 80.0–100.0)
Platelets: 322 10*3/uL (ref 150–400)
RBC: 4.74 MIL/uL (ref 3.87–5.11)
RDW: 19.9 % — ABNORMAL HIGH (ref 11.5–15.5)
WBC: 11.7 10*3/uL — ABNORMAL HIGH (ref 4.0–10.5)
nRBC: 0 % (ref 0.0–0.2)

## 2022-06-16 LAB — BASIC METABOLIC PANEL
Anion gap: 11 (ref 5–15)
BUN: 8 mg/dL (ref 6–20)
CO2: 28 mmol/L (ref 22–32)
Calcium: 8.8 mg/dL — ABNORMAL LOW (ref 8.9–10.3)
Chloride: 97 mmol/L — ABNORMAL LOW (ref 98–111)
Creatinine, Ser: 0.75 mg/dL (ref 0.44–1.00)
GFR, Estimated: >60 mL/min (ref >60)
Glucose, Bld: 232 mg/dL — ABNORMAL HIGH (ref 70–99)
Potassium: 3.2 mmol/L — ABNORMAL LOW (ref 3.5–5.1)
Sodium: 136 mmol/L (ref 135–145)

## 2022-06-16 LAB — GLUCOSE, CAPILLARY
Glucose-Capillary: 270 mg/dL — ABNORMAL HIGH (ref 70–99)
Glucose-Capillary: 263 mg/dL — ABNORMAL HIGH (ref 70–99)
Glucose-Capillary: 224 mg/dL — ABNORMAL HIGH (ref 70–99)
Glucose-Capillary: 303 mg/dL — ABNORMAL HIGH (ref 70–99)

## 2022-06-16 LAB — MAGNESIUM: Magnesium: 1.8 mg/dL (ref 1.7–2.4)

## 2022-06-16 MED ORDER — GABAPENTIN 400 MG PO CAPS
400.0000 mg | ORAL_CAPSULE | Freq: Two times a day (BID) | ORAL | Status: DC
  Administered 2022-06-17 – 2022-06-20 (×7): 400 mg via ORAL
  Filled 2022-06-16 (×7): qty 1

## 2022-06-16 MED ORDER — MUPIROCIN 2 % EX OINT
1.0000 | TOPICAL_OINTMENT | Freq: Two times a day (BID) | CUTANEOUS | Status: DC
  Administered 2022-06-16 – 2022-06-20 (×9): 1 via NASAL
  Filled 2022-06-16 (×3): qty 22

## 2022-06-16 MED ORDER — GABAPENTIN 400 MG PO CAPS
400.0000 mg | ORAL_CAPSULE | Freq: Once | ORAL | Status: AC
  Administered 2022-06-17: 400 mg via ORAL
  Filled 2022-06-16: qty 1

## 2022-06-16 MED ORDER — PRAMIPEXOLE DIHYDROCHLORIDE 0.25 MG PO TABS
0.2500 mg | ORAL_TABLET | Freq: Three times a day (TID) | ORAL | Status: DC | PRN
  Administered 2022-06-16 – 2022-06-20 (×11): 0.25 mg via ORAL
  Filled 2022-06-16 (×14): qty 1

## 2022-06-16 MED ORDER — CYCLOBENZAPRINE HCL 10 MG PO TABS
10.0000 mg | ORAL_TABLET | Freq: Three times a day (TID) | ORAL | Status: DC | PRN
  Filled 2022-06-16: qty 1

## 2022-06-16 MED ORDER — GABAPENTIN 400 MG PO CAPS: 800.0000 mg | ORAL_CAPSULE | Freq: Every day | ORAL | Status: DC

## 2022-06-16 MED ORDER — CHLORHEXIDINE GLUCONATE CLOTH 2 % EX PADS
6.0000 | MEDICATED_PAD | Freq: Every day | CUTANEOUS | Status: DC
  Administered 2022-06-17 – 2022-06-20 (×4): 6 via TOPICAL

## 2022-06-16 MED ORDER — GABAPENTIN 400 MG PO CAPS
800.0000 mg | ORAL_CAPSULE | Freq: Every day | ORAL | Status: DC
  Administered 2022-06-17 – 2022-06-19 (×3): 800 mg via ORAL
  Filled 2022-06-16 (×3): qty 2

## 2022-06-16 MED ORDER — PREGABALIN 25 MG PO CAPS
50.0000 mg | ORAL_CAPSULE | Freq: Three times a day (TID) | ORAL | Status: DC
  Administered 2022-06-16 – 2022-06-20 (×12): 50 mg via ORAL
  Filled 2022-06-16 (×12): qty 2

## 2022-06-16 MED ORDER — POTASSIUM CHLORIDE CRYS ER 20 MEQ PO TBCR
40.0000 meq | EXTENDED_RELEASE_TABLET | Freq: Once | ORAL | Status: AC
  Administered 2022-06-16: 40 meq via ORAL
  Filled 2022-06-16: qty 2

## 2022-06-16 NOTE — Progress Notes (Signed)
PROGRESS NOTE  [name]  QAS:[id number] DOB: Jul 08, 1961 DOA: 06/14/2022 PCP: Shelda Pal, DO   Brief Narrative: Patient is a 61 year old female with history of morbid obesity, diabetes type 2, right AKA, irritable bowel syndrome, hypertension, hyperlipidemia, hypothyroidism, rheumatoid arthritis, asthma who was admitted for the concern of left foot diabetic wound infection, abnormal labs.  She was being monitored for left foot infection for last 4 weeks, lab work done as an outpatient showed leukocytosis, elevated ESR, patient was also having severe left foot pain, foul smell so she was advised to go to the emergency department.  She has been started antibiotics for antibiotics.  Culture sent.  Orthopedics consulted.MRI suggestive of cellulitis, associated for osteomyelitis.  Further plan as per orthopedics, possible need of debridement.  Assessment & Plan:  Principal Problem:   Diabetic foot infection ([place])    Left foot diabetic wound infection: Concern for osteomyelitis initially .  Presented with fall as well, left foot pain, elevated ESR.Blood culture,NGTD.  Continue broad spectrum antibiotics.  MRI suggestive of cellulitis, associated for osteomyelitis.  Further plan as per orthopedics, possible need of debridement.  Acute on chronic blood loss anemia: In the setting of chronic left foot wound.  Has evidence of iron deficiency anemia as per iron studies done on 12/22/2021.  Baseline hemoglobin around 11.  Currently hemoglobin in the range of 8-9.  Started on iron supplementation.  Continue to monitor CBC  Diabetes type 2: Recent A1c of 9.2.  Continue sliding scale insulin.  Monitor blood sugars  Hypokalemia/hypomagnesemia: Continue monitoring and supplementation  Severe morbid obesity: BMI of 56  Hypothyroidism: TSH of 9.4.  Takes levothyroxine at maximum dose.  We recommend to follow-up in 4 to 6 weeks.  Hyperlipidemia: On ezitimibe        DVT  prophylaxis:Lovenox     Code Status: Full Code  Family Communication: None at bedside  Patient status:Inpatient  Patient is from :Home  Anticipated discharge NL:[id number]  Estimated DC date:Not sure,waiting for ortho evalaution   Consultants: Ortho  Procedures:None yet  Antimicrobials:  Anti-infectives (From admission, onward)    Start     Dose/Rate Route Frequency Ordered Stop   06/15/22 2200  vancomycin (VANCOREADY) IVPB 1750 mg/350 mL        1,750 mg 175 mL/hr over 120 Minutes Intravenous Every 24 hours 06/14/22 2155     06/14/22 2300  vancomycin (VANCOCIN) IVPB 1000 mg/200 mL premix       See Hyperspace for full Linked Orders Report.   1,000 mg 200 mL/hr over 60 Minutes Intravenous  Once 06/14/22 2147 06/14/22 2348   06/14/22 2200  vancomycin (VANCOCIN) IVPB 1000 mg/200 mL premix       See Hyperspace for full Linked Orders Report.   1,000 mg 200 mL/hr over 60 Minutes Intravenous  Once 06/14/22 2147 06/14/22 2348   06/14/22 2130  vancomycin (VANCOCIN) 2,500 mg in sodium chloride 0.9 % 500 mL IVPB  Status:  Discontinued        2,500 mg 262.5 mL/hr over 120 Minutes Intravenous  Once 06/14/22 2129 06/14/22 2147   06/14/22 1700  cefTRIAXone (ROCEPHIN) 2 g in sodium chloride 0.9 % 100 mL IVPB       See Hyperspace for full Linked Orders Report.   2 g 200 mL/hr over 30 Minutes Intravenous Every 24 hours 06/14/22 1647 06/21/22 1659   06/14/22 1700  metroNIDAZOLE (FLAGYL) IVPB 500 mg       See Hyperspace for full Linked Orders Report.  500 mg 100 mL/hr over 60 Minutes Intravenous Every 12 hours 06/14/22 1647 06/21/22 1659       Subjective: Patient seen and examined at the bedside today.  Hemodynamically stable.  Currently pain is well controlled.  She is concerned about her sugars and chronic pain.  Objective: Vitals:   06/16/22 0100 06/16/22 0534 06/16/22 0756 06/16/22 1148  BP: (!) 145/67 (!) 125/57 (!) 110/98 131/74  Pulse: 99 92 89 84  Resp: 19 15 17  (!) 22  Temp:  97.9 F (36.6 C) 98 F (36.7 C) 98.3 F (36.8 C) 97.8 F (36.6 C)  TempSrc: Oral Oral Oral Oral  SpO2: 96% 95% 98% 98%  Weight:      Height:        Intake/Output Summary (Last 24 hours) at 06/16/2022 1244 Last data filed at 06/15/2022 1756 Gross per 24 hour  Intake 225.49 ml  Output --  Net 225.49 ml   Filed Weights   06/14/22 1622 06/14/22 1623  Weight: 136.1 kg (!) 140.2 kg    Examination:  General exam: Overall comfortable, not in distress, deconditioned HEENT: PERRL Respiratory system:  no wheezes or crackles  Cardiovascular system: S1 & S2 heard, RRR.  Gastrointestinal system: Abdomen is nondistended, soft and nontender. Central nervous system: Alert and oriented Extremities: Right AKA, chronic venous stasis changes on the left foot, left foot ulcer covered with dressing Skin: No rashes, no ulcers,no icterus     Data Reviewed: I have personally reviewed following labs and imaging studies  CBC: Recent Labs  Lab 06/14/22 0947 06/15/22 0212 06/16/22 0254  WBC 11.3* 11.1* 11.7*  HGB 9.3* 8.9* 8.8*  HCT 32.2* 32.8* 32.2*  MCV 63.7 aL* 68.2* 67.9*  PLT 335.0 302 322   Basic Metabolic Panel: Recent Labs  Lab 06/14/22 1705 06/15/22 0212 06/16/22 0254  NA 139  --  136  K 3.1* 3.1* 3.2*  CL 101  --  97*  CO2 28  --  28  GLUCOSE 124*  --  232*  BUN 11  --  8  CREATININE 0.77 0.71 0.75  CALCIUM 8.7*  --  8.8*  MG  --  1.6*  --      Recent Results (from the past 240 hour(s))  Blood culture (routine x 2)     Status: None (Preliminary result)   Collection Time: 06/14/22  4:58 PM   Specimen: BLOOD  Result Value Ref Range Status   Specimen Description   Final    BLOOD LEFT ANTECUBITAL Performed at [facility], [address] [place], [place]    Special Requests   Final    BOTTLES DRAWN AEROBIC AND ANAEROBIC Blood Culture adequate volume Performed at [facility], [address] [place], [place]    Culture    Final    NO GROWTH 2 DAYS Performed at [facility], [address] [place], [place]    Report Status PENDING  Incomplete  Blood culture (routine x 2)     Status: None (Preliminary result)   Collection Time: 06/14/22  5:02 PM   Specimen: BLOOD  Result Value Ref Range Status   Specimen Description   Final    BLOOD RIGHT ANTECUBITAL Performed at [facility], [address] [place], [place]    Special Requests   Final    BOTTLES DRAWN AEROBIC AND ANAEROBIC Blood Culture results may not be optimal due to an excessive volume  of blood received in culture bottles Performed at [facility], [address] [place], [place] [address]    Culture   Final    NO GROWTH 2 DAYS Performed at [facility], [place], [place], [place] [address]    Report Status PENDING  Incomplete  MRSA Next Gen by PCR, Nasal     Status: Abnormal   Collection Time: 06/15/22  4:44 AM   Specimen: Nasal Mucosa; Nasal Swab  Result Value Ref Range Status   MRSA by PCR Next Gen DETECTED (A) NOT DETECTED Final    Comment: CRITICAL RESULT CALLED TO, READ BACK BY AND VERIFIED WITH:  C/ ABRAHAM O., RN 06/15/22 9628 A. LAFRANCE (NOTE) The GeneXpert MRSA Assay (FDA approved for NASAL specimens only), is one component of a comprehensive MRSA colonization surveillance program. It is not intended to diagnose MRSA infection nor to guide or monitor treatment for MRSA infections. Test performance is not FDA approved in patients less than 63 years old. Performed at [facility], [place] [address] [place], [place] [address]      Radiology Studies: MR FOOT LEFT WO CONTRAST  Result Date: 06/15/2022 CLINICAL DATA:  Foot swelling. Diabetic. Osteomyelitis suspected. Left foot ulcer for about a month. EXAM: MRI OF THE LEFT FOOT WITHOUT CONTRAST TECHNIQUE: Multiplanar, multisequence MR imaging of the left forefoot was performed. No intravenous contrast was administered.  COMPARISON:  left foot radiographs 06/14/2022 and 05/26/2021; CT left foot 01/04/2018 FINDINGS: Despite efforts by the technologist and patient, motion artifact is present on today's exam and could not be eliminated. This reduces exam sensitivity and specificity. Bones/Joint/Cartilage Note is made of the plantar lateral midfoot soft tissue ulcer in the region of the cuboid through the mid shaft of the fifth metatarsal as described below. There is again an old healed fracture of the fifth metatarsal shaft. No marrow edema is seen, with attention to the cuboid and fifth metatarsal bones closest to the soft tissue ulceration and soft tissue edema. Mild-to-moderate great toe metatarsophalangeal joint, first through fifth interphalangeal joint osteoarthritis. Moderate second and mild first, third, fourth, and fifth tarsometatarsal cartilage thinning and peripheral osteophytosis. Similar mild-to-moderate osteoarthritis of the navicular-cuneiform articulations. Ligaments The Lisfranc ligament complex is intact. Muscles and Tendons Mild-to-moderate edema throughout the intrinsic midfoot musculature, nonspecific myositis. Moderate fatty infiltration of the abductor digiti minimi muscle. There is also fatty infiltration of a proximal posterior aspect of the flexor digitorum brevis and abductor digiti minimi muscles. Soft tissues There is attenuation and irregularity of the plantar lateral midfoot soft tissues, with apparent ulceration of the overlying skin to the level of the subcutaneous fat in a region measuring up to approximately 2.9 cm along the short axis of the foot (axial series 4, image 22) and 4.2 cm along the longitudinal dimension of the foot (sagittal series 8, image 7). There is also edema within the adjacent abductor digiti minimi muscle body along the course of a fifth metatarsal. There is moderate subcutaneous fat edema throughout the lateral, plantar, medial, and dorsal aspects of the midfoot and forefoot,  greatest laterally. There is mild complex fluid within the deep aspect of the subcutaneous fat of the dorsal midfoot diffusely (series 5 images 11 through 20). IMPRESSION: 1. There is a broad soft tissue ulceration within the plantar lateral midfoot from the level of the cuboid through the mid fifth metatarsal shaft. Within the limitations of motion artifact, no definite bone marrow edema or cortical erosion to indicate acute  osteomyelitis. 2. Old healed fracture of the fifth metatarsal shaft. 3. Moderate subcutaneous fat edema throughout the midfoot and forefoot. Findings are compatible with cellulitis. No walled-off fluid collection/abscess is seen. 4. Mild-to-moderate edema throughout the midfoot musculature, nonspecific myositis. Electronically Signed   By: Yvonne Kendall M.D.   On: 06/15/2022 12:39   DG Foot Complete Left  Result Date: 06/14/2022 CLINICAL DATA:  Wound at bottom of LEFT foot for 4 weeks, question osteomyelitis; history of gout and diabetes mellitus EXAM: LEFT FOOT - COMPLETE 3+ VIEW COMPARISON:  05/26/2021 FINDINGS: Osseous demineralization. Old healed posttraumatic deformity distal fifth metatarsal. Joint spaces preserved. Plantar calcaneal spur. No acute fracture, dislocation, or bone destruction. No definite soft tissue gas. IMPRESSION: No acute osseous abnormalities. Osseous demineralization with healed posttraumatic deformity of distal fifth metatarsal. Electronically Signed   By: Lavonia Dana M.D.   On: 06/14/2022 17:03    Scheduled Meds:  [START ON 06/17/2022] Chlorhexidine Gluconate Cloth  6 each Topical Q0600   enoxaparin (LOVENOX) injection  70 mg Subcutaneous Q24H   ezetimibe  10 mg Oral Daily   ferrous gluconate  324 mg Oral Q breakfast   furosemide  40 mg Oral Daily   gabapentin  400 mg Oral TID   insulin aspart  0-20 Units Subcutaneous TID WC   insulin aspart  0-5 Units Subcutaneous QHS   levothyroxine  200 mcg Oral QAC breakfast   lidocaine  1 patch Transdermal Q24H    LORazepam  1 mg Oral Once   mometasone-formoterol  2 puff Inhalation BID   mupirocin ointment  1 Application Nasal BID   pantoprazole  40 mg Oral Daily   pregabalin  50 mg Oral TID   saccharomyces boulardii  250 mg Oral TID   Continuous Infusions:  sodium chloride Stopped (06/14/22 1950)   cefTRIAXone (ROCEPHIN)  IV 2 g (06/15/22 1656)   And   metronidazole 500 mg (06/16/22 0530)   vancomycin 1,750 mg (06/15/22 2147)     LOS: 1 day   Shelly Coss, MD [facility] P10/02/2022, 12:44 PM

## 2022-06-16 NOTE — Inpatient Diabetes Management (Signed)
Inpatient Diabetes Program Recommendations  AACE/ADA: New Consensus Statement on Inpatient Glycemic Control (2015)  Target Ranges:  Prepandial:   less than 140 mg/dL      Peak postprandial:   less than 180 mg/dL (1-2 hours)      Critically ill patients:  140 - 180 mg/dL   Lab Results  Component Value Date   GLUCAP 263 (H) 06/16/2022   HGBA1C 9.2 (H) 05/26/2022    Review of Glycemic Control  Diabetes history: type 2 Outpatient Diabetes medications: U-500 insulin 10-25 units once or twice per day, Farxiga 10 mg daily, Metformin 1000 mg BID Current orders for Inpatient glycemic control: Novolog RESISTANT correction scale TID & HS scale  Inpatient Diabetes Program Recommendations:   Spoke with patient at the bedside. Patient states that she was diagnosed with diabetes about 20 years ago. States that she had an endocrinologist in [facility] at one time, but only uses her PCP for her diabetes control at this time.   States that she has been on U-500 insulin since 2011. She started on Farxiga about 6 months ago and her needs for the U-500 insulin have dropped significantly. She states that she takes about 10-15 units with breakfast and sometimes does not any more insulin the rest of the day. She regulates her own dosages. States that her blood sugars at home usually run 125-175 mg/dl.   Recommend continuing patient on Novolog RESISTANT correction scale TID and Novolog 0-5 units HS scale. Recommend adding Novolog 5 units TID with meals if patient eats at least 50% of meals and if blood sugars continue to be elevated. Do not recommend starting patient on U-500 insulin while in the hospital.   Harvel Ricks RN BSN CDE Diabetes Coordinator Pager: [phone]  8am-5pm

## 2022-06-17 DIAGNOSIS — E11628 Type 2 diabetes mellitus with other skin complications: Secondary | ICD-10-CM | POA: Diagnosis not present

## 2022-06-17 DIAGNOSIS — L089 Local infection of the skin and subcutaneous tissue, unspecified: Secondary | ICD-10-CM | POA: Diagnosis not present

## 2022-06-17 LAB — BLOOD CULTURE ID PANEL (REFLEXED) - BCID2
A.calcoaceticus-baumannii: NOT DETECTED
Bacteroides fragilis: DETECTED — AB
Candida albicans: NOT DETECTED
Candida auris: NOT DETECTED
Candida glabrata: NOT DETECTED
Candida krusei: NOT DETECTED
Candida parapsilosis: NOT DETECTED
Candida tropicalis: NOT DETECTED
Cryptococcus neoformans/gattii: NOT DETECTED
Enterobacter cloacae complex: NOT DETECTED
Enterobacterales: NOT DETECTED
Enterococcus Faecium: NOT DETECTED
Enterococcus faecalis: NOT DETECTED
Escherichia coli: NOT DETECTED
Haemophilus influenzae: NOT DETECTED
Klebsiella aerogenes: NOT DETECTED
Klebsiella oxytoca: NOT DETECTED
Klebsiella pneumoniae: NOT DETECTED
Listeria monocytogenes: NOT DETECTED
Neisseria meningitidis: NOT DETECTED
Proteus species: NOT DETECTED
Pseudomonas aeruginosa: NOT DETECTED
Salmonella species: NOT DETECTED
Serratia marcescens: NOT DETECTED
Staphylococcus aureus (BCID): NOT DETECTED
Staphylococcus epidermidis: NOT DETECTED
Staphylococcus lugdunensis: NOT DETECTED
Staphylococcus species: NOT DETECTED
Stenotrophomonas maltophilia: NOT DETECTED
Streptococcus agalactiae: NOT DETECTED
Streptococcus pneumoniae: NOT DETECTED
Streptococcus pyogenes: NOT DETECTED
Streptococcus species: NOT DETECTED

## 2022-06-17 LAB — CBC
HCT: 32.7 % — ABNORMAL LOW (ref 36.0–46.0)
Hemoglobin: 8.9 g/dL — ABNORMAL LOW (ref 12.0–15.0)
MCH: 18.4 pg — ABNORMAL LOW (ref 26.0–34.0)
MCHC: 27.2 g/dL — ABNORMAL LOW (ref 30.0–36.0)
MCV: 67.7 fL — ABNORMAL LOW (ref 80.0–100.0)
Platelets: 343 10*3/uL (ref 150–400)
RBC: 4.83 MIL/uL (ref 3.87–5.11)
RDW: 19.9 % — ABNORMAL HIGH (ref 11.5–15.5)
WBC: 10.6 10*3/uL — ABNORMAL HIGH (ref 4.0–10.5)
nRBC: 0 % (ref 0.0–0.2)

## 2022-06-17 LAB — GLUCOSE, CAPILLARY
Glucose-Capillary: 287 mg/dL — ABNORMAL HIGH (ref 70–99)
Glucose-Capillary: 309 mg/dL — ABNORMAL HIGH (ref 70–99)
Glucose-Capillary: 263 mg/dL — ABNORMAL HIGH (ref 70–99)
Glucose-Capillary: 283 mg/dL — ABNORMAL HIGH (ref 70–99)

## 2022-06-17 LAB — BASIC METABOLIC PANEL
Anion gap: 12 (ref 5–15)
BUN: 10 mg/dL (ref 6–20)
CO2: 25 mmol/L (ref 22–32)
Calcium: 8.4 mg/dL — ABNORMAL LOW (ref 8.9–10.3)
Chloride: 96 mmol/L — ABNORMAL LOW (ref 98–111)
Creatinine, Ser: 0.84 mg/dL (ref 0.44–1.00)
GFR, Estimated: >60 mL/min (ref >60)
Glucose, Bld: 329 mg/dL — ABNORMAL HIGH (ref 70–99)
Potassium: 3.6 mmol/L (ref 3.5–5.1)
Sodium: 133 mmol/L — ABNORMAL LOW (ref 135–145)

## 2022-06-17 MED ORDER — SODIUM CHLORIDE 0.9 % IV SOLN: 2.0000 g | INTRAVENOUS | Status: DC

## 2022-06-17 MED ORDER — METRONIDAZOLE 500 MG/100ML IV SOLN
500.0000 mg | Freq: Three times a day (TID) | INTRAVENOUS | Status: DC
  Administered 2022-06-17 (×2): 500 mg via INTRAVENOUS
  Filled 2022-06-17 (×2): qty 100

## 2022-06-17 MED ORDER — METRONIDAZOLE 500 MG/100ML IV SOLN
500.0000 mg | Freq: Two times a day (BID) | INTRAVENOUS | Status: DC
  Administered 2022-06-18 – 2022-06-20 (×5): 500 mg via INTRAVENOUS
  Filled 2022-06-17 (×5): qty 100

## 2022-06-17 MED ORDER — INSULIN GLARGINE-YFGN 100 UNIT/ML ~~LOC~~ SOLN
20.0000 [IU] | Freq: Every day | SUBCUTANEOUS | Status: DC
  Administered 2022-06-17: 20 [IU] via SUBCUTANEOUS
  Filled 2022-06-17 (×2): qty 0.2

## 2022-06-17 MED ORDER — SODIUM CHLORIDE 0.9 % IV SOLN
2.0000 g | INTRAVENOUS | Status: DC
  Administered 2022-06-17 – 2022-06-19 (×3): 2 g via INTRAVENOUS
  Filled 2022-06-17 (×3): qty 20

## 2022-06-17 MED ORDER — HYDROMORPHONE HCL 1 MG/ML IJ SOLN
0.5000 mg | Freq: Once | INTRAMUSCULAR | Status: DC
  Filled 2022-06-17: qty 0.5

## 2022-06-17 MED ORDER — INSULIN ASPART 100 UNIT/ML IJ SOLN
5.0000 [IU] | Freq: Three times a day (TID) | INTRAMUSCULAR | Status: DC
  Administered 2022-06-17 – 2022-06-18 (×3): 5 [IU] via SUBCUTANEOUS

## 2022-06-17 NOTE — Progress Notes (Signed)
Pharmacy Antibiotic Note  [name] is a 61 y.o. female admitted on 06/14/2022 with worsening L side diabetic foot infection.  Pharmacy has been consulted for vancomycin dosing. Scr is 0.77, at baseline. Patient received 1000 mg of loading dose only. SCr is stable at 0.84  Plan: Vancomycin 1750 mg Q24h (eAUC 466.7, Scr 0.8, Vd 0.5) Rocephin and flagyl per MD Monitor clinical progress, cultures/sensitivities, renal function, abx plan Vancomycin levels at steady state if continuing   Height: 5\' 2"  (157.5 cm) Weight: (!) 140.2 kg (309 lb) IBW/kg (Calculated) : 50.1  Temp (24hrs), Avg:98.4 F (36.9 C), Min:97.9 F (36.6 C), Max:98.8 F (37.1 C)  Recent Labs  Lab 06/14/22 0947 06/14/22 1705 06/14/22 1901 06/15/22 0212 06/16/22 0254 06/17/22 0217  WBC 11.3*  --   --  11.1* 11.7* 10.6*  CREATININE  --  0.77  --  0.71 0.75 0.84  LATICACIDVEN  --  3.2* 1.4  --   --   --      Estimated Creatinine Clearance: 96.8 mL/min (by C-G formula based on SCr of 0.84 mg/dL).    Allergies  Allergen Reactions   Fish Allergy Anaphylaxis    INCLUDES OMEGA 3 OILS   Fish Oil Anaphylaxis and Swelling    THROAT SWELLS INCLUDES FISH AS A CLASS   Omega-3 Fatty Acids Swelling    Throat swelling  Has tolerated Glucerna nutritional drinks    Crestor [Rosuvastatin] Other (See Comments)    Extreme joint pain, to this & other statins   Gabapentin Anxiety    Anxiety on high doses   Lovastatin Other (See Comments)    EXTREME JOINT PAIN   Metronidazole Nausea Only and Swelling    Headache and shakes Nausea and vomiting Pt tolerating IV 06/14/22   Red Yeast Rice [Cholestin] Other (See Comments)    Muscle pain and severe joint pain.    Rotigotine Swelling    (Neupro) Extreme edema    Atrovent Hfa [Ipratropium Bromide Hfa] Other (See Comments)    wheezing   Iodine Swelling   Other Other (See Comments)    Surgical Staples causes redness/infection per patient.   Pepto-Bismol [Bismuth  Subsalicylate] Nausea And Vomiting    Extreme vomiting   Victoza [Liraglutide]     Unsure of reaction    Enalapril Cough   Suprep [Na Sulfate-K Sulfate-Mg Sulf] Nausea And Vomiting   Tape Other (See Comments)    Electrodes causes skin breakdown    Antimicrobials this admission: Rocephin 10/4 >>  Flagyl 10/4 >>  Vancomycin 10/4 >>  Dose adjustments this admission: N/a  Microbiology results: 10/4 BCx: Bacteriodes fragilis 10/5 MRSA PCR positive    Thank you for allowing [place] to participate in this patients care. Jens Som, PharmD (secure chat) 06/17/2022 12:59 PM

## 2022-06-17 NOTE — Plan of Care (Signed)

## 2022-06-17 NOTE — Progress Notes (Signed)
PHARMACY - PHYSICIAN COMMUNICATION CRITICAL VALUE ALERT - BLOOD CULTURE IDENTIFICATION (BCID)  [name] is an 61 y.o. female who presented to [facility] on 06/14/2022 with a chief complaint of diabetic foot wound   Name of physician (or Provider) Contacted: A. Zebedee Iba  Current antibiotics: Vancomycin, Ceftriaxone, Metronidazole  Changes to prescribed antibiotics recommended:  No changes   Results for orders placed or performed during the hospital encounter of 06/14/22  Blood Culture ID Panel (Reflexed) (Collected: 06/14/2022  5:02 PM)  Result Value Ref Range   Enterococcus faecalis NOT DETECTED NOT DETECTED   Enterococcus Faecium NOT DETECTED NOT DETECTED   Listeria monocytogenes NOT DETECTED NOT DETECTED   Staphylococcus species NOT DETECTED NOT DETECTED   Staphylococcus aureus (BCID) NOT DETECTED NOT DETECTED   Staphylococcus epidermidis NOT DETECTED NOT DETECTED   Staphylococcus lugdunensis NOT DETECTED NOT DETECTED   Streptococcus species NOT DETECTED NOT DETECTED   Streptococcus agalactiae NOT DETECTED NOT DETECTED   Streptococcus pneumoniae NOT DETECTED NOT DETECTED   Streptococcus pyogenes NOT DETECTED NOT DETECTED   A.calcoaceticus-baumannii NOT DETECTED NOT DETECTED   Bacteroides fragilis DETECTED (A) NOT DETECTED   Enterobacterales NOT DETECTED NOT DETECTED   Enterobacter cloacae complex NOT DETECTED NOT DETECTED   Escherichia coli NOT DETECTED NOT DETECTED   Klebsiella aerogenes NOT DETECTED NOT DETECTED   Klebsiella oxytoca NOT DETECTED NOT DETECTED   Klebsiella pneumoniae NOT DETECTED NOT DETECTED   Proteus species NOT DETECTED NOT DETECTED   Salmonella species NOT DETECTED NOT DETECTED   Serratia marcescens NOT DETECTED NOT DETECTED   Haemophilus influenzae NOT DETECTED NOT DETECTED   Neisseria meningitidis NOT DETECTED NOT DETECTED   Pseudomonas aeruginosa NOT DETECTED NOT DETECTED   Stenotrophomonas maltophilia NOT DETECTED NOT DETECTED   Candida albicans  NOT DETECTED NOT DETECTED   Candida auris NOT DETECTED NOT DETECTED   Candida glabrata NOT DETECTED NOT DETECTED   Candida krusei NOT DETECTED NOT DETECTED   Candida parapsilosis NOT DETECTED NOT DETECTED   Candida tropicalis NOT DETECTED NOT DETECTED   Cryptococcus neoformans/gattii NOT DETECTED NOT DETECTED    [name] 06/17/2022  4:01 AM

## 2022-06-17 NOTE — Progress Notes (Addendum)
PROGRESS NOTE  [name]  DQQ:[id number] DOB: 1960/12/13 DOA: 06/14/2022 PCP: Shelda Pal, DO   Brief Narrative: Patient is a 61 year old female with history of morbid obesity, diabetes type 2, right AKA, irritable bowel syndrome, hypertension, hyperlipidemia, hypothyroidism, rheumatoid arthritis, asthma who was admitted for the concern of left foot diabetic wound infection, abnormal labs.  She was being monitored for left foot infection for last 4 weeks, lab work done as an outpatient showed leukocytosis, elevated ESR, patient was also having severe left foot pain, foul smell so she was advised to go to the emergency department.  She has been started antibiotics for antibiotics.  Culture sent.  Orthopedics consulted.MRI suggestive of cellulitis, associated for osteomyelitis.  Further plan as per orthopedics, possible need of debridement.  Assessment & Plan:  Principal Problem:   Diabetic foot infection ([place])    Left foot diabetic wound infection: Concern for osteomyelitis initially .  Presented with fall as well, left foot pain, elevated ESR.Blood culture,NGTD.  Continue broad spectrum antibiotics.  MRI suggestive of cellulitis, associated for osteomyelitis.  Further plan as per orthopedics, possible need of debridement.  Dr. Sharol Given seeing her tomorrow  Gram-negative bacteremia: One of the blood cultures/anaerobic bottle sent on 10/4 showed Bacteriodes fragilis.  Continue current antibiotics for now.  Chance  of contaminant,will repeat cultures  Acute on chronic blood loss anemia: In the setting of chronic left foot wound.  Has evidence of iron deficiency anemia as per iron studies done on 12/22/2021.  Baseline hemoglobin around 11.  Currently hemoglobin in the range of 8-9.  Started on iron supplementation.  Continue to monitor CBC  Diabetes type 2: Recent A1c of 9.2.  Continue sliding scale insulin.  Monitor blood sugars  Hypokalemia/hypomagnesemia: Continue monitoring and  supplementation  Severe morbid obesity: BMI of 56  Hypothyroidism: TSH of 9.4.  Free T4 normal.Takes levothyroxine at maximum dose.  We recommend to follow-up in 4 to 6 weeks.  Hyperlipidemia: On ezitimibe        DVT prophylaxis:Lovenox     Code Status: Full Code  Family Communication: None at bedside  Patient status:Inpatient  Patient is from :Home  Anticipated discharge JH:ERDE  Estimated DC date:Not sure,waiting for ortho evalaution   Consultants: Ortho  Procedures:None yet  Antimicrobials:  Anti-infectives (From admission, onward)    Start     Dose/Rate Route Frequency Ordered Stop   06/17/22 1700  cefTRIAXone (ROCEPHIN) 2 g in sodium chloride 0.9 % 100 mL IVPB       See Hyperspace for full Linked Orders Report.   2 g 200 mL/hr over 30 Minutes Intravenous Every 24 hours 06/17/22 0402 06/21/22 1659   06/17/22 0415  metroNIDAZOLE (FLAGYL) IVPB 500 mg       See Hyperspace for full Linked Orders Report.   500 mg 100 mL/hr over 60 Minutes Intravenous Every 8 hours 06/17/22 0402     06/15/22 2200  vancomycin (VANCOREADY) IVPB 1750 mg/350 mL        1,750 mg 175 mL/hr over 120 Minutes Intravenous Every 24 hours 06/14/22 2155     06/14/22 2300  vancomycin (VANCOCIN) IVPB 1000 mg/200 mL premix       See Hyperspace for full Linked Orders Report.   1,000 mg 200 mL/hr over 60 Minutes Intravenous  Once 06/14/22 2147 06/14/22 2348   06/14/22 2200  vancomycin (VANCOCIN) IVPB 1000 mg/200 mL premix       See Hyperspace for full Linked Orders Report.   1,000 mg 200 mL/hr over  60 Minutes Intravenous  Once 06/14/22 2147 06/14/22 2348   06/14/22 2130  vancomycin (VANCOCIN) 2,500 mg in sodium chloride 0.9 % 500 mL IVPB  Status:  Discontinued        2,500 mg 262.5 mL/hr over 120 Minutes Intravenous  Once 06/14/22 2129 06/14/22 2147   06/14/22 1700  cefTRIAXone (ROCEPHIN) 2 g in sodium chloride 0.9 % 100 mL IVPB  Status:  Discontinued       See Hyperspace for full Linked Orders  Report.   2 g 200 mL/hr over 30 Minutes Intravenous Every 24 hours 06/14/22 1647 06/17/22 0402   06/14/22 1700  metroNIDAZOLE (FLAGYL) IVPB 500 mg  Status:  Discontinued       See Hyperspace for full Linked Orders Report.   500 mg 100 mL/hr over 60 Minutes Intravenous Every 12 hours 06/14/22 1647 06/17/22 0402       Subjective: Patient seen and examined at the bedside this morning.  She appeared comfortable today.  Pain is better ,no new complaints.  Afebrile, hemodynamically stable  Objective: Vitals:   06/16/22 1652 06/16/22 1938 06/17/22 0341 06/17/22 0755  BP: (!) 140/66 (!) 119/58 124/69 (!) 145/77  Pulse: 96   84  Resp: $Remo'18 20 17 16  'ujMqr$ Temp: 98.4 F (36.9 C) 98.6 F (37 C) 98.8 F (37.1 C) 98.4 F (36.9 C)  TempSrc:  Oral Oral Oral  SpO2: 97% 99% 99% 96%  Weight:      Height:        Intake/Output Summary (Last 24 hours) at 06/17/2022 1129 Last data filed at 06/17/2022 1018 Gross per 24 hour  Intake 2439.61 ml  Output 802 ml  Net 1637.61 ml   Filed Weights   06/14/22 1622 06/14/22 1623  Weight: 136.1 kg (!) 140.2 kg    Examination:   General exam: Overall comfortable, not in distress, morbidly obese HEENT: PERRL Respiratory system:  no wheezes or crackles  Cardiovascular system: S1 & S2 heard, RRR.  Gastrointestinal system: Abdomen is nondistended, soft and nontender. Central nervous system: Alert and oriented Extremities: No right AKA, chronic venous stasis changes on the left foot, left foot ulcer on the plantar surface covered with dressing Skin: No rashes,no icterus       Data Reviewed: I have personally reviewed following labs and imaging studies  CBC: Recent Labs  Lab 06/14/22 0947 06/15/22 0212 06/16/22 0254 06/17/22 0217  WBC 11.3* 11.1* 11.7* 10.6*  HGB 9.3* 8.9* 8.8* 8.9*  HCT 32.2* 32.8* 32.2* 32.7*  MCV 63.7 aL* 68.2* 67.9* 67.7*  PLT 335.0 302 322 433   Basic Metabolic Panel: Recent Labs  Lab 06/14/22 1705 06/15/22 0212  06/16/22 0254 06/17/22 0217  NA 139  --  136 133*  K 3.1* 3.1* 3.2* 3.6  CL 101  --  97* 96*  CO2 28  --  28 25  GLUCOSE 124*  --  232* 329*  BUN 11  --  8 10  CREATININE 0.77 0.71 0.75 0.84  CALCIUM 8.7*  --  8.8* 8.4*  MG  --  1.6* 1.8  --      Recent Results (from the past 240 hour(s))  Blood culture (routine x 2)     Status: None (Preliminary result)   Collection Time: 06/14/22  4:58 PM   Specimen: BLOOD  Result Value Ref Range Status   Specimen Description   Final    BLOOD LEFT ANTECUBITAL Performed at [facility], [address] [place], [place] [address]  Special Requests   Final    BOTTLES DRAWN AEROBIC AND ANAEROBIC Blood Culture adequate volume Performed at [facility], [address] [place], [place] [address]    Culture   Final    NO GROWTH 3 DAYS Performed at [facility], [place] [address], [place], [place] [address]    Report Status PENDING  Incomplete  Blood culture (routine x 2)     Status: None (Preliminary result)   Collection Time: 06/14/22  5:02 PM   Specimen: BLOOD  Result Value Ref Range Status   Specimen Description   Final    BLOOD RIGHT ANTECUBITAL Performed at [facility], [address] [place], [place] [address]    Special Requests   Final    BOTTLES DRAWN AEROBIC AND ANAEROBIC Blood Culture results may not be optimal due to an excessive volume of blood received in culture bottles Performed at [facility], [address] [place], [place] [address]    Culture  Setup Time   Final    ANAEROBIC BOTTLE ONLY GRAM NEGATIVE RODS CRITICAL RESULT CALLED TO, READ BACK BY AND VERIFIED WITH:  C/ PHARMD JAMES L. 06/17/22 0355 A. LAFRANCE    Culture   Final    CULTURE REINCUBATED FOR BETTER GROWTH Performed at [facility], [place] [address] [place], [place] [address]    Report Status PENDING  Incomplete  Blood Culture ID Panel (Reflexed)     Status: Abnormal   Collection Time: 06/14/22   5:02 PM  Result Value Ref Range Status   Enterococcus faecalis NOT DETECTED NOT DETECTED Final   Enterococcus Faecium NOT DETECTED NOT DETECTED Final   Listeria monocytogenes NOT DETECTED NOT DETECTED Final   Staphylococcus species NOT DETECTED NOT DETECTED Final   Staphylococcus aureus (BCID) NOT DETECTED NOT DETECTED Final   Staphylococcus epidermidis NOT DETECTED NOT DETECTED Final   Staphylococcus lugdunensis NOT DETECTED NOT DETECTED Final   Streptococcus species NOT DETECTED NOT DETECTED Final   Streptococcus agalactiae NOT DETECTED NOT DETECTED Final   Streptococcus pneumoniae NOT DETECTED NOT DETECTED Final   Streptococcus pyogenes NOT DETECTED NOT DETECTED Final   A.calcoaceticus-baumannii NOT DETECTED NOT DETECTED Final   Bacteroides fragilis DETECTED (A) NOT DETECTED Final    Comment: CRITICAL RESULT CALLED TO, READ BACK BY AND VERIFIED WITH:  C/ PHARMD JAMES L. 06/17/22 0355 A. LAFRANCE    Enterobacterales NOT DETECTED NOT DETECTED Final   Enterobacter cloacae complex NOT DETECTED NOT DETECTED Final   Escherichia coli NOT DETECTED NOT DETECTED Final   Klebsiella aerogenes NOT DETECTED NOT DETECTED Final   Klebsiella oxytoca NOT DETECTED NOT DETECTED Final   Klebsiella pneumoniae NOT DETECTED NOT DETECTED Final   Proteus species NOT DETECTED NOT DETECTED Final   Salmonella species NOT DETECTED NOT DETECTED Final   Serratia marcescens NOT DETECTED NOT DETECTED Final   Haemophilus influenzae NOT DETECTED NOT DETECTED Final   Neisseria meningitidis NOT DETECTED NOT DETECTED Final   Pseudomonas aeruginosa NOT DETECTED NOT DETECTED Final   Stenotrophomonas maltophilia NOT DETECTED NOT DETECTED Final   Candida albicans NOT DETECTED NOT DETECTED Final   Candida auris NOT DETECTED NOT DETECTED Final   Candida glabrata NOT DETECTED NOT DETECTED Final   Candida krusei NOT DETECTED NOT DETECTED Final   Candida parapsilosis NOT DETECTED NOT DETECTED Final   Candida tropicalis NOT  DETECTED NOT DETECTED Final   Cryptococcus neoformans/gattii NOT DETECTED NOT DETECTED Final    Comment:  Performed at [facility], [place] [address] [place], [place] [address]  MRSA Next Gen by PCR, Nasal     Status: Abnormal   Collection Time: 06/15/22  4:44 AM   Specimen: Nasal Mucosa; Nasal Swab  Result Value Ref Range Status   MRSA by PCR Next Gen DETECTED (A) NOT DETECTED Final    Comment: CRITICAL RESULT CALLED TO, READ BACK BY AND VERIFIED WITH:  C/ ABRAHAM O., RN 06/15/22 2993 A. LAFRANCE (NOTE) The GeneXpert MRSA Assay (FDA approved for NASAL specimens only), is one component of a comprehensive MRSA colonization surveillance program. It is not intended to diagnose MRSA infection nor to guide or monitor treatment for MRSA infections. Test performance is not FDA approved in patients less than 35 years old. Performed at [facility], [place] [address], [place], [place] [address]      Radiology Studies: MR FOOT LEFT WO CONTRAST  Result Date: 06/15/2022 CLINICAL DATA:  Foot swelling. Diabetic. Osteomyelitis suspected. Left foot ulcer for about a month. EXAM: MRI OF THE LEFT FOOT WITHOUT CONTRAST TECHNIQUE: Multiplanar, multisequence MR imaging of the left forefoot was performed. No intravenous contrast was administered. COMPARISON:  left foot radiographs 06/14/2022 and 05/26/2021; CT left foot 01/04/2018 FINDINGS: Despite efforts by the technologist and patient, motion artifact is present on today's exam and could not be eliminated. This reduces exam sensitivity and specificity. Bones/Joint/Cartilage Note is made of the plantar lateral midfoot soft tissue ulcer in the region of the cuboid through the mid shaft of the fifth metatarsal as described below. There is again an old healed fracture of the fifth metatarsal shaft. No marrow edema is seen, with attention to the cuboid and fifth metatarsal bones closest to the soft tissue ulceration and soft tissue edema. Mild-to-moderate  great toe metatarsophalangeal joint, first through fifth interphalangeal joint osteoarthritis. Moderate second and mild first, third, fourth, and fifth tarsometatarsal cartilage thinning and peripheral osteophytosis. Similar mild-to-moderate osteoarthritis of the navicular-cuneiform articulations. Ligaments The Lisfranc ligament complex is intact. Muscles and Tendons Mild-to-moderate edema throughout the intrinsic midfoot musculature, nonspecific myositis. Moderate fatty infiltration of the abductor digiti minimi muscle. There is also fatty infiltration of a proximal posterior aspect of the flexor digitorum brevis and abductor digiti minimi muscles. Soft tissues There is attenuation and irregularity of the plantar lateral midfoot soft tissues, with apparent ulceration of the overlying skin to the level of the subcutaneous fat in a region measuring up to approximately 2.9 cm along the short axis of the foot (axial series 4, image 22) and 4.2 cm along the longitudinal dimension of the foot (sagittal series 8, image 7). There is also edema within the adjacent abductor digiti minimi muscle body along the course of a fifth metatarsal. There is moderate subcutaneous fat edema throughout the lateral, plantar, medial, and dorsal aspects of the midfoot and forefoot, greatest laterally. There is mild complex fluid within the deep aspect of the subcutaneous fat of the dorsal midfoot diffusely (series 5 images 11 through 20). IMPRESSION: 1. There is a broad soft tissue ulceration within the plantar lateral midfoot from the level of the cuboid through the mid fifth metatarsal shaft. Within the limitations of motion artifact, no definite bone marrow edema or cortical erosion to indicate acute osteomyelitis. 2. Old healed fracture of the fifth metatarsal shaft. 3. Moderate subcutaneous fat edema throughout the midfoot and forefoot. Findings are compatible with cellulitis. No walled-off fluid collection/abscess is seen. 4.  Mild-to-moderate edema throughout the midfoot musculature, nonspecific myositis. Electronically Signed  By: Yvonne Kendall M.D.   On: 06/15/2022 12:39    Scheduled Meds:  Chlorhexidine Gluconate Cloth  6 each Topical Q0600   enoxaparin (LOVENOX) injection  70 mg Subcutaneous Q24H   ezetimibe  10 mg Oral Daily   ferrous gluconate  324 mg Oral Q breakfast   furosemide  40 mg Oral Daily   gabapentin  400 mg Oral BID   gabapentin  800 mg Oral QHS   insulin aspart  0-20 Units Subcutaneous TID WC   insulin aspart  0-5 Units Subcutaneous QHS   insulin aspart  5 Units Subcutaneous TID WC   insulin glargine-yfgn  20 Units Subcutaneous Daily   levothyroxine  200 mcg Oral QAC breakfast   lidocaine  1 patch Transdermal Q24H   LORazepam  1 mg Oral Once   mometasone-formoterol  2 puff Inhalation BID   mupirocin ointment  1 Application Nasal BID   pantoprazole  40 mg Oral Daily   pregabalin  50 mg Oral TID   saccharomyces boulardii  250 mg Oral TID   Continuous Infusions:  sodium chloride Stopped (06/14/22 1950)   cefTRIAXone (ROCEPHIN)  IV     And   metronidazole 500 mg (06/17/22 0447)   vancomycin 1,750 mg (06/16/22 2032)     LOS: 2 days   Shelly Coss, MD [facility] P10/03/2022, 11:29 AM

## 2022-06-18 DIAGNOSIS — L089 Local infection of the skin and subcutaneous tissue, unspecified: Secondary | ICD-10-CM | POA: Diagnosis not present

## 2022-06-18 DIAGNOSIS — E11628 Type 2 diabetes mellitus with other skin complications: Secondary | ICD-10-CM | POA: Diagnosis not present

## 2022-06-18 LAB — GLUCOSE, CAPILLARY
Glucose-Capillary: 348 mg/dL — ABNORMAL HIGH (ref 70–99)
Glucose-Capillary: 250 mg/dL — ABNORMAL HIGH (ref 70–99)
Glucose-Capillary: 253 mg/dL — ABNORMAL HIGH (ref 70–99)
Glucose-Capillary: 264 mg/dL — ABNORMAL HIGH (ref 70–99)

## 2022-06-18 MED ORDER — INSULIN GLARGINE-YFGN 100 UNIT/ML ~~LOC~~ SOLN
40.0000 [IU] | Freq: Every day | SUBCUTANEOUS | Status: DC
  Administered 2022-06-19: 40 [IU] via SUBCUTANEOUS
  Filled 2022-06-18: qty 0.4

## 2022-06-18 MED ORDER — DIPHENHYDRAMINE HCL 25 MG PO CAPS
25.0000 mg | ORAL_CAPSULE | Freq: Three times a day (TID) | ORAL | Status: DC | PRN
  Administered 2022-06-18 – 2022-06-20 (×3): 25 mg via ORAL
  Filled 2022-06-18 (×3): qty 1

## 2022-06-18 MED ORDER — INSULIN ASPART 100 UNIT/ML IJ SOLN
7.0000 [IU] | Freq: Three times a day (TID) | INTRAMUSCULAR | Status: DC
  Administered 2022-06-18 – 2022-06-20 (×7): 7 [IU] via SUBCUTANEOUS

## 2022-06-18 MED ORDER — INSULIN GLARGINE-YFGN 100 UNIT/ML ~~LOC~~ SOLN
30.0000 [IU] | Freq: Every day | SUBCUTANEOUS | Status: DC
  Administered 2022-06-18: 30 [IU] via SUBCUTANEOUS
  Filled 2022-06-18: qty 0.3

## 2022-06-18 NOTE — Progress Notes (Addendum)
PROGRESS NOTE  [name]  ENI:[id number] DOB: 1961/02/16 DOA: 06/14/2022 PCP: Shelda Pal, DO   Brief Narrative: Patient is a 61 year old female with history of morbid obesity, diabetes type 2, right AKA, irritable bowel syndrome, hypertension, hyperlipidemia, hypothyroidism, rheumatoid arthritis, asthma who was admitted for the concern of left foot diabetic wound infection, abnormal labs.  She was being monitored for left foot infection for last 4 weeks, lab work done as an outpatient showed leukocytosis, elevated ESR, patient was also having severe left foot pain, foul smell so she was advised to go to the emergency department.  She has been started antibiotics for antibiotics. Orthopedics consulted.MRI suggestive of cellulitis, associated for osteomyelitis.  Further plan as per orthopedics, possible need of debridement.  Assessment & Plan:  Principal Problem:   Diabetic foot infection ([place])    Left foot diabetic wound infection: Concern for osteomyelitis initially .  Presented with fall as well, left foot pain, elevated ESR.Blood culture,NGTD.  Continue broad spectrum antibiotics.  MRI suggestive of cellulitis, associated for osteomyelitis.  Further plan as per orthopedics, possible need of debridement.  Dr. Sharol Given seeing her tomorrow  Gram-negative bacteremia: One of the blood cultures/anaerobic bottle sent on 10/4 showed Bacteriodes fragilis.  Continue current antibiotics for now.  Chance  of contaminant,repeat cultures sent  Acute on chronic blood loss anemia: In the setting of chronic left foot wound.  Has evidence of iron deficiency anemia as per iron studies done on 12/22/2021.  Baseline hemoglobin around 11.  Currently hemoglobin in the range of 8-9.  Started on iron supplementation.  Continue to monitor CBC.  Denies any change in the color of the stool  Diabetes type 2: Recent A1c of 9.2.  Remains hyperglycemic.  Diabetic coordinator consulted.  Takes concentrated  insulin at home.  Currently on sliding scale and long acting  Hypokalemia/hypomagnesemia: Continue monitoring and supplementation  Severe morbid obesity: BMI of 56  Hypothyroidism: TSH of 9.4.  Free T4 normal.Takes levothyroxine at maximum dose.  We recommend to follow-up in 4 to 6 weeks.  Hyperlipidemia: On ezitimibe        DVT prophylaxis:Lovenox     Code Status: Full Code  Family Communication: None at bedside  Patient status:Inpatient  Patient is from :Home  Anticipated discharge IR:[facility]  Estimated DC date:Not sure,waiting for ortho evalaution   Consultants: Ortho  Procedures:None yet  Antimicrobials:  Anti-infectives (From admission, onward)    Start     Dose/Rate Route Frequency Ordered Stop   06/18/22 0045  metroNIDAZOLE (FLAGYL) IVPB 500 mg       See Hyperspace for full Linked Orders Report.   500 mg 100 mL/hr over 60 Minutes Intravenous Every 12 hours 06/17/22 1613     06/17/22 1700  cefTRIAXone (ROCEPHIN) 2 g in sodium chloride 0.9 % 100 mL IVPB  Status:  Discontinued       See Hyperspace for full Linked Orders Report.   2 g 200 mL/hr over 30 Minutes Intravenous Every 24 hours 06/17/22 0402 06/17/22 1613   06/17/22 1700  cefTRIAXone (ROCEPHIN) 2 g in sodium chloride 0.9 % 100 mL IVPB       See Hyperspace for full Linked Orders Report.   2 g 200 mL/hr over 30 Minutes Intravenous Every 24 hours 06/17/22 1613 06/21/22 1659   06/17/22 0415  metroNIDAZOLE (FLAGYL) IVPB 500 mg  Status:  Discontinued       See Hyperspace for full Linked Orders Report.   500 mg 100 mL/hr over 60 Minutes Intravenous  Every 8 hours 06/17/22 0402 06/17/22 1613   06/15/22 2200  vancomycin (VANCOREADY) IVPB 1750 mg/350 mL        1,750 mg 175 mL/hr over 120 Minutes Intravenous Every 24 hours 06/14/22 2155     06/14/22 2300  vancomycin (VANCOCIN) IVPB 1000 mg/200 mL premix       See Hyperspace for full Linked Orders Report.   1,000 mg 200 mL/hr over 60 Minutes Intravenous  Once  06/14/22 2147 06/14/22 2348   06/14/22 2200  vancomycin (VANCOCIN) IVPB 1000 mg/200 mL premix       See Hyperspace for full Linked Orders Report.   1,000 mg 200 mL/hr over 60 Minutes Intravenous  Once 06/14/22 2147 06/14/22 2348   06/14/22 2130  vancomycin (VANCOCIN) 2,500 mg in sodium chloride 0.9 % 500 mL IVPB  Status:  Discontinued        2,500 mg 262.5 mL/hr over 120 Minutes Intravenous  Once 06/14/22 2129 06/14/22 2147   06/14/22 1700  cefTRIAXone (ROCEPHIN) 2 g in sodium chloride 0.9 % 100 mL IVPB  Status:  Discontinued       See Hyperspace for full Linked Orders Report.   2 g 200 mL/hr over 30 Minutes Intravenous Every 24 hours 06/14/22 1647 06/17/22 0402   06/14/22 1700  metroNIDAZOLE (FLAGYL) IVPB 500 mg  Status:  Discontinued       See Hyperspace for full Linked Orders Report.   500 mg 100 mL/hr over 60 Minutes Intravenous Every 12 hours 06/14/22 1647 06/17/22 0402       Subjective: Patient seen and examined at bedside today.  Hemodynamically stable, comfortable without any complaints.  Feels better today  Objective: Vitals:   06/17/22 2012 06/18/22 0607 06/18/22 0809 06/18/22 0832  BP: (!) 144/54 (!) 152/81  123/73  Pulse: 97 94 95 91  Resp: _0 Temp: 98.3 F (36.8 C) 98.2 F (36.8 C)  98 F (36.7 C)  TempSrc: Oral Oral    SpO2: 97% 99%  94%  Weight:      Height:        Intake/Output Summary (Last 24 hours) at 06/18/2022 1119 Last data filed at 06/18/2022 0900 Gross per 24 hour  Intake 830 ml  Output --  Net 830 ml   Filed Weights   06/14/22 1622 06/14/22 1623  Weight: 136.1 kg (!) 140.2 kg    Examination:   General exam: Overall comfortable, not in distress, morbidly obese HEENT: PERRL Respiratory system:  no wheezes or crackles  Cardiovascular system: S1 & S2 heard, RRR.  Gastrointestinal system: Abdomen is nondistended, soft and nontender. Central nervous system: Alert and oriented Extremities: Right BKA, chronic venous stasis changes  on the left lower extremity, left foot ulcer on the plantar surface covered with dressing Skin: No rashes, no ulcers,no icterus      Data Reviewed: I have personally reviewed following labs and imaging studies  CBC: Recent Labs  Lab 06/14/22 0947 06/15/22 0212 06/16/22 0254 06/17/22 0217  WBC 11.3* 11.1* 11.7* 10.6*  HGB 9.3* 8.9* 8.8* 8.9*  HCT 32.2* 32.8* 32.2* 32.7*  MCV 63.7 aL* 68.2* 67.9* 67.7*  PLT 335.0 302 322 992   Basic Metabolic Panel: Recent Labs  Lab 06/14/22 1705 06/15/22 0212 06/16/22 0254 06/17/22 0217  NA 139  --  136 133*  K 3.1* 3.1* 3.2* 3.6  CL 101  --  97* 96*  CO2 28  --  28 25  GLUCOSE 124*  --  232* 329*  BUN  11  --  8 10  CREATININE 0.77 0.71 0.75 0.84  CALCIUM 8.7*  --  8.8* 8.4*  MG  --  1.6* 1.8  --      Recent Results (from the past 240 hour(s))  Blood culture (routine x 2)     Status: None (Preliminary result)   Collection Time: 06/14/22  4:58 PM   Specimen: BLOOD  Result Value Ref Range Status   Specimen Description   Final    BLOOD LEFT ANTECUBITAL Performed at [facility], [address] [place], [place] [address]    Special Requests   Final    BOTTLES DRAWN AEROBIC AND ANAEROBIC Blood Culture adequate volume Performed at [facility], [address] [place], [place] [address]    Culture   Final    NO GROWTH 4 DAYS Performed at [facility], [place] [address] [place], [place] [address]    Report Status PENDING  Incomplete  Blood culture (routine x 2)     Status: None (Preliminary result)   Collection Time: 06/14/22  5:02 PM   Specimen: BLOOD  Result Value Ref Range Status   Specimen Description   Final    BLOOD RIGHT ANTECUBITAL Performed at [facility], [address] [place], [place] [address]    Special Requests   Final    BOTTLES DRAWN AEROBIC AND ANAEROBIC Blood Culture results may not be optimal due to an excessive volume of blood received in culture bottles Performed  at [facility], [address] [place], [place] [address]    Culture  Setup Time   Final    ANAEROBIC BOTTLE ONLY GRAM NEGATIVE RODS CRITICAL RESULT CALLED TO, READ BACK BY AND VERIFIED WITH:  C/ PHARMD JAMES L. 06/17/22 0355 A. LAFRANCE    Culture   Final    CULTURE REINCUBATED FOR BETTER GROWTH Performed at [facility], [place] [address] [place], [place] [address]    Report Status PENDING  Incomplete  Blood Culture ID Panel (Reflexed)     Status: Abnormal   Collection Time: 06/14/22  5:02 PM  Result Value Ref Range Status   Enterococcus faecalis NOT DETECTED NOT DETECTED Final   Enterococcus Faecium NOT DETECTED NOT DETECTED Final   Listeria monocytogenes NOT DETECTED NOT DETECTED Final   Staphylococcus species NOT DETECTED NOT DETECTED Final   Staphylococcus aureus (BCID) NOT DETECTED NOT DETECTED Final   Staphylococcus epidermidis NOT DETECTED NOT DETECTED Final   Staphylococcus lugdunensis NOT DETECTED NOT DETECTED Final   Streptococcus species NOT DETECTED NOT DETECTED Final   Streptococcus agalactiae NOT DETECTED NOT DETECTED Final   Streptococcus pneumoniae NOT DETECTED NOT DETECTED Final   Streptococcus pyogenes NOT DETECTED NOT DETECTED Final   A.calcoaceticus-baumannii NOT DETECTED NOT DETECTED Final   Bacteroides fragilis DETECTED (A) NOT DETECTED Final    Comment: CRITICAL RESULT CALLED TO, READ BACK BY AND VERIFIED WITH:  C/ PHARMD JAMES L. 06/17/22 0355 A. LAFRANCE    Enterobacterales NOT DETECTED NOT DETECTED Final   Enterobacter cloacae complex NOT DETECTED NOT DETECTED Final   Escherichia coli NOT DETECTED NOT DETECTED Final   Klebsiella aerogenes NOT DETECTED NOT DETECTED Final   Klebsiella oxytoca NOT DETECTED NOT DETECTED Final   Klebsiella pneumoniae NOT DETECTED NOT DETECTED Final   Proteus species NOT DETECTED NOT DETECTED Final   Salmonella species NOT DETECTED NOT DETECTED Final   Serratia marcescens NOT DETECTED NOT DETECTED Final  Haemophilus influenzae NOT DETECTED NOT DETECTED Final   Neisseria meningitidis NOT DETECTED NOT DETECTED Final   Pseudomonas aeruginosa NOT DETECTED NOT DETECTED Final   Stenotrophomonas maltophilia NOT DETECTED NOT DETECTED Final   Candida albicans NOT DETECTED NOT DETECTED Final   Candida auris NOT DETECTED NOT DETECTED Final   Candida glabrata NOT DETECTED NOT DETECTED Final   Candida krusei NOT DETECTED NOT DETECTED Final   Candida parapsilosis NOT DETECTED NOT DETECTED Final   Candida tropicalis NOT DETECTED NOT DETECTED Final   Cryptococcus neoformans/gattii NOT DETECTED NOT DETECTED Final    Comment: Performed at [facility], [place] [address] [place], [place] [address]  MRSA Next Gen by PCR, Nasal     Status: Abnormal   Collection Time: 06/15/22  4:44 AM   Specimen: Nasal Mucosa; Nasal Swab  Result Value Ref Range Status   MRSA by PCR Next Gen DETECTED (A) NOT DETECTED Final    Comment: CRITICAL RESULT CALLED TO, READ BACK BY AND VERIFIED WITH:  C/ ABRAHAM O., RN 06/15/22 4696 A. LAFRANCE (NOTE) The GeneXpert MRSA Assay (FDA approved for NASAL specimens only), is one component of a comprehensive MRSA colonization surveillance program. It is not intended to diagnose MRSA infection nor to guide or monitor treatment for MRSA infections. Test performance is not FDA approved in patients less than 11 years old. Performed at [facility], [place] [address] [place], [place] [address]   Culture, blood (Routine X 2) w Reflex to ID Panel     Status: None (Preliminary result)   Collection Time: 06/17/22 12:20 PM   Specimen: BLOOD  Result Value Ref Range Status   Specimen Description BLOOD LEFT ANTECUBITAL  Final   Special Requests   Final    BOTTLES DRAWN AEROBIC AND ANAEROBIC Blood Culture adequate volume   Culture   Final    NO GROWTH < 24 HOURS Performed at [facility], [place] [address], [place], [place] [address]    Report Status PENDING  Incomplete  Culture,  blood (Routine X 2) w Reflex to ID Panel     Status: None (Preliminary result)   Collection Time: 06/17/22 12:27 PM   Specimen: BLOOD  Result Value Ref Range Status   Specimen Description BLOOD BLOOD LEFT HAND  Final   Special Requests   Final    BOTTLES DRAWN AEROBIC AND ANAEROBIC Blood Culture adequate volume   Culture   Final    NO GROWTH < 24 HOURS Performed at [facility], [place] [address], [place], [place] [address]    Report Status PENDING  Incomplete     Radiology Studies: No results found.  Scheduled Meds:  Chlorhexidine Gluconate Cloth  6 each Topical Q0600   enoxaparin (LOVENOX) injection  70 mg Subcutaneous Q24H   ezetimibe  10 mg Oral Daily   ferrous gluconate  324 mg Oral Q breakfast   furosemide  40 mg Oral Daily   gabapentin  400 mg Oral BID   gabapentin  800 mg Oral QHS    HYDROmorphone (DILAUDID) injection  0.5 mg Intravenous Once   insulin aspart  0-20 Units Subcutaneous TID WC   insulin aspart  0-5 Units Subcutaneous QHS   insulin aspart  5 Units Subcutaneous TID WC   insulin glargine-yfgn  30 Units Subcutaneous Daily   levothyroxine  200 mcg Oral QAC breakfast   lidocaine  1 patch Transdermal Q24H   LORazepam  1 mg Oral Once   mometasone-formoterol  2  puff Inhalation BID   mupirocin ointment  1 Application Nasal BID   pantoprazole  40 mg Oral Daily   pregabalin  50 mg Oral TID   saccharomyces boulardii  250 mg Oral TID   Continuous Infusions:  sodium chloride Stopped (06/14/22 1950)   cefTRIAXone (ROCEPHIN)  IV 2 g (06/17/22 1653)   And   metronidazole 500 mg (06/18/22 0229)   vancomycin 1,750 mg (06/17/22 2130)     LOS: 3 days   Shelly Coss, MD [facility] P10/04/2022, 11:19 AM

## 2022-06-19 DIAGNOSIS — E08621 Diabetes mellitus due to underlying condition with foot ulcer: Principal | ICD-10-CM

## 2022-06-19 DIAGNOSIS — L97423 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: Secondary | ICD-10-CM

## 2022-06-19 DIAGNOSIS — L089 Local infection of the skin and subcutaneous tissue, unspecified: Secondary | ICD-10-CM | POA: Diagnosis not present

## 2022-06-19 DIAGNOSIS — E11628 Type 2 diabetes mellitus with other skin complications: Secondary | ICD-10-CM | POA: Diagnosis not present

## 2022-06-19 LAB — GLUCOSE, CAPILLARY
Glucose-Capillary: 238 mg/dL — ABNORMAL HIGH (ref 70–99)
Glucose-Capillary: 268 mg/dL — ABNORMAL HIGH (ref 70–99)
Glucose-Capillary: 158 mg/dL — ABNORMAL HIGH (ref 70–99)
Glucose-Capillary: 213 mg/dL — ABNORMAL HIGH (ref 70–99)

## 2022-06-19 LAB — CBC
HCT: 35.8 % — ABNORMAL LOW (ref 36.0–46.0)
Hemoglobin: 9.6 g/dL — ABNORMAL LOW (ref 12.0–15.0)
MCH: 18.3 pg — ABNORMAL LOW (ref 26.0–34.0)
MCHC: 26.8 g/dL — ABNORMAL LOW (ref 30.0–36.0)
MCV: 68.3 fL — ABNORMAL LOW (ref 80.0–100.0)
Platelets: 363 10*3/uL (ref 150–400)
RBC: 5.24 MIL/uL — ABNORMAL HIGH (ref 3.87–5.11)
RDW: 20.6 % — ABNORMAL HIGH (ref 11.5–15.5)
WBC: 10.1 10*3/uL (ref 4.0–10.5)
nRBC: 0 % (ref 0.0–0.2)

## 2022-06-19 LAB — CULTURE, BLOOD (ROUTINE X 2)
Culture: NO GROWTH
Special Requests: ADEQUATE

## 2022-06-19 LAB — BASIC METABOLIC PANEL
Anion gap: 13 (ref 5–15)
BUN: 9 mg/dL (ref 6–20)
CO2: 28 mmol/L (ref 22–32)
Calcium: 9.5 mg/dL (ref 8.9–10.3)
Chloride: 98 mmol/L (ref 98–111)
Creatinine, Ser: 0.7 mg/dL (ref 0.44–1.00)
GFR, Estimated: >60 mL/min (ref >60)
Glucose, Bld: 214 mg/dL — ABNORMAL HIGH (ref 70–99)
Potassium: 4.1 mmol/L (ref 3.5–5.1)
Sodium: 139 mmol/L (ref 135–145)

## 2022-06-19 MED ORDER — INSULIN GLARGINE-YFGN 100 UNIT/ML ~~LOC~~ SOLN
45.0000 [IU] | Freq: Every day | SUBCUTANEOUS | Status: DC
  Administered 2022-06-20: 45 [IU] via SUBCUTANEOUS
  Filled 2022-06-19: qty 0.45

## 2022-06-19 MED ORDER — TRIAMCINOLONE 0.1 % CREAM:EUCERIN CREAM 1:1
TOPICAL_CREAM | Freq: Three times a day (TID) | CUTANEOUS | Status: DC | PRN
  Filled 2022-06-19: qty 1

## 2022-06-19 NOTE — Consult Note (Cosign Needed)
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  Poorly controlled DM with neuropathy  Morbid obesity  H/o C diff in 2017  Prior Group B strep/proteus bacteremia 10/2018 and E faecalis bacteremia 05/2021   She remains afebrile with no leukocytosis Blood cx 10/4 bacteroides fragilis in 1/2 sets  Blood cx 10/7 with no growth in 2 days  Left ABI with mild LLE arterial disease - fu with Vascular   She has declined amputation and willing to try antibiotics. IV vs PO would not likely make much of a difference in terms of healing of the wound. She has already had approx 6 days of broad spectrum IV abtx so far. Reasonable to switch to PO doxycycline and augmentin to complete 4-6  weeks of tx. H/O e coli in the urine 06/2021 that was intermediate to augmentin is likely not significant currently  Fu with Ortho and ID as instructed.   I have seen and examined the patient. I have personally reviewed the clinical findings, laboratory findings, microbiological data and imaging studies. The assessment and treatment plan was discussed with the Nurse Practitioner Janene Madeira. I agree with her/his recommendations except following additions/corrections.  Rosiland Oz, MD Infectious Disease Physician [facility] [address] [place], [place] [address] Phone: [phone]  Fax: [facility]    Date of Admission:  06/14/2022     Total days of antibiotics 6  Vancomycin   Ceftriaxone  Metronidazole              Reason for Consult: DFU with ?osteomyelitis     Referring Provider: Tawanna Solo Primary Care Provider: Shelda Pal, DO   Assessment: [name] is a 61 y.o. female here for admission to care for diabetic foot wound on the left plantar midfoot. This has been present for  5 weeks with acute worsening of the condition since last Monday that prompted her to go back to PCP for evaluation. With significantly elevated ESR > 120 she was advised to go to the hospital for concern over acute OM of the foot. While MRI did not pick up any radiographic evidence, the very high ESR and Dr. Jess Barters assessment of the wound are concerning for such. Will continue with 4 weeks of total antibiotics to see how she does. Can transition to PO doxycycline + augmentin with close follow up in ID clinic / Ortho wound clinic to gauge treatment response. She reports good tolerance and no allergies to Amoxicillin and tolerated this multiple times since 2019 from what I can see in the chart.   Secondary bacteremia with bacteroides fragillis 2/2 to this wound. Has had 6d of IV metronidazole --> can continue treatment with Augmentin. She was not willing to go back to trial PO metronidazole d/t severe n/v in the apst.   H/O cdiff - would keep off quinolone due to this or consider adding PO vanc prophylaxis. Close follow up outpatient.   H/O e coli in the urine that was intermediate to augmentin - will consider this at follow up and offer change if needed. Could also consider omadacycline for lower CDiff risk at follow up if change in therapy is warranted.     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, with necrosis of muscle (HCC)    Chlorhexidine Gluconate Cloth  6 each Topical Q0600   enoxaparin (LOVENOX) injection  70 mg Subcutaneous Q24H   ezetimibe  10 mg Oral Daily   ferrous gluconate  324 mg Oral Q breakfast   furosemide  40 mg Oral Daily   gabapentin  400 mg Oral BID   gabapentin  800 mg Oral QHS    HYDROmorphone (DILAUDID) injection  0.5 mg Intravenous Once   insulin aspart  0-20 Units  Subcutaneous TID WC   insulin aspart  0-5 Units Subcutaneous QHS   insulin aspart  7 Units Subcutaneous TID WC   [START ON 06/20/2022] insulin glargine-yfgn  45 Units Subcutaneous Daily   levothyroxine  200 mcg Oral QAC breakfast   lidocaine  1 patch Transdermal Q24H   LORazepam  1 mg Oral Once   mometasone-formoterol  2 puff Inhalation BID   mupirocin ointment  1 Application Nasal BID   pantoprazole  40 mg Oral Daily   pregabalin  50 mg Oral TID   saccharomyces boulardii  250 mg Oral TID    HPI: [name] is a 61 y.o. female admitted from home for plantar foot wound that has been present for 5 weeks. Started out as tingling over the skin then without any cutaneous presentation. Then she noticed an abrupt onset of blister that popped to reveal a full thickness underlying wound. She has had no fevers / chills since but had pain on the plantar aspect of the foot. Was given a course of Doxycycline by PCP but she never got to get this to start given she had significantly elevated ESR and leukocytosis, advised to go to hospital for evaluation. She does feel better while she has been here in the context of her foot.   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  Plain films with osseous demineralization of distal 5th metatarsal.  MRI with: 1. There is a broad soft tissue ulceration within the plantar lateral midfoot from the level of the cuboid through the mid fifth metatarsal shaft. Within the limitations of motion artifact, no definite bone marrow edema or cortical erosion to indicate acute osteomyelitis. 2. Old healed fracture of the fifth metatarsal shaft. 3. Moderate subcutaneous fat edema throughout the midfoot and forefoot. Findings are compatible with cellulitis. No walled-off fluid collection/abscess is seen. 4. Mild-to-moderate edema  throughout the midfoot musculature, nonspecific myositis.  Dr. Sharol Given has seen her in consult - necrotic soft tissue wound. Recommended amputation but the patient elected to try antibiotics and topical wound care ointment.   She has some preserved sensation on the foot (deep pressure).  A1C last 9.2% in September. Obese with BMI 56.  History of CDiff in 2016 - no issues with recurrence since then.     Review of Systems: Review of Systems  Constitutional:  Negative for chills, diaphoresis, fever and malaise/fatigue.  Respiratory: Negative.    Cardiovascular: Negative.   Genitourinary: Negative.   Musculoskeletal: Negative.   Skin:  Positive for rash (chronic skin changes overlying the left lower leg d/t lymphedema.).  Neurological: Negative.   Psychiatric/Behavioral: Negative.      Past Medical History:  Diagnosis Date   Aortic stenosis    mild AS by echo  02/2021   Asthma    "on daily RX and rescue inhaler" (04/18/2018)   Chronic diastolic CHF (congestive heart failure) ([place]) 09/2015   Chronic lower back pain    Chronic neck pain    Chronic pain syndrome    Fentanyl Patch   Colon polyps    Coronary artery disease    cath with normal LM, 30% LAD, 85% mid RCA and 95% distal RCA s/p PCI of the mid to distal RCA and now on DAPT with ASA and Ticagrelor.     Eczema    Excessive daytime sleepiness 11/26/2015   Fibromyalgia    Gallstones    GERD (gastroesophageal reflux disease)    Heart murmur    "noted for the 1st time on 04/18/2018"   History of blood transfusion 07/2010   "S/P oophorectomy"   History of gout    History of hiatal hernia 1980s   "gone now" (04/18/2018)   Hyperlipidemia    Hypertension    takes Metoprolol and Enalapril daily   Hypothyroidism    takes Synthroid daily   IBS (irritable bowel syndrome)    Migraine    "nothing in the 2000s" (04/18/2018)   Mixed connective tissue disease ([place])    NAFLD (nonalcoholic fatty liver disease)    Pneumonia    "several  times" (04/18/2018)   PVC's (premature ventricular contractions)    noted on event monitor 02/2021   Rheumatoid arthritis ([place])    "hands, elbows, shoulders, probably knees" (04/18/2018)   Scoliosis    Sjogren's syndrome ([place])    Sleep apnea    mild - does not use cpap   Spondylosis    Type II diabetes mellitus ([place])    takes Metformin and Hum R daily (04/18/2018)   Walker as ambulation aid    also uses wheelchair   Past Surgical History:  Procedure Laterality Date   ABDOMINAL HYSTERECTOMY  06/2005   "w/right ovariy"   AMPUTATION Right 01/28/2020    RIGHT FOURTH AND FIFTH RAY AMPUTATION   AMPUTATION Right 01/28/2020   Procedure: RIGHT FOURTH AND FIFTH RAY AMPUTATION,;  Surgeon: Newt Minion, MD;  Location: [place];  Service: Orthopedics;  Laterality: Right;   AMPUTATION Right 06/01/2021   Procedure: RIGHT BELOW KNEE AMPUTATION;  Surgeon: Newt Minion, MD;  Location: [place];  Service: Orthopedics;  Laterality: Right;   AMPUTATION Right 07/22/2021   Procedure: RIGHT ABOVE KNEE AMPUTATION;  Surgeon: Newt Minion, MD;  Location: [place];  Service: Orthopedics;  Laterality: Right;   APPENDECTOMY     BREAST BIOPSY Bilateral    9 total (04/18/2018)   CORONARY ANGIOPLASTY WITH STENT PLACEMENT  10/01/2015   normal LM, 30% LAD, 85% mid RCA and 95% distal RCA s/p PCI of the mid to distal RCA and now on DAPT with ASA and Ticagrelor.     CORONARY STENT INTERVENTION Right 04/18/2018   Procedure: CORONARY STENT INTERVENTION;  Surgeon: Burnell Blanks, MD;  Location: [place] [facility];  Service: Cardiovascular;  Laterality: Right;   DILATION AND CURETTAGE OF UTERUS     FRACTURE SURGERY     LAPAROSCOPIC CHOLECYSTECTOMY     LEFT HEART CATH AND CORONARY ANGIOGRAPHY N/A 04/18/2018   Procedure: LEFT HEART CATH AND CORONARY ANGIOGRAPHY;  Surgeon: Burnell Blanks, MD;  Location: [place] [facility];  Service: Cardiovascular;  Laterality: N/A;   MUSCLE BIOPSY Left    "leg"   OOPHORECTOMY  07/2010    RADIOLOGY WITH ANESTHESIA N/A 05/25/2016   Procedure:  RADIOLOGY WITH ANESTHESIA;  Surgeon: Medication Radiologist, MD;  Location: [place];  Service: Radiology;  Laterality: N/A;   STUMP REVISION Right 06/29/2021   Procedure: REVISION RIGHT BELOW KNEE AMPUTATION;  Surgeon: Newt Minion, MD;  Location: [place];  Service: Orthopedics;  Laterality: Right;   TEE WITHOUT CARDIOVERSION N/A 06/01/2021   Procedure: TRANSESOPHAGEAL ECHOCARDIOGRAM (TEE);  Surgeon: Geralynn Rile, MD;  Location: [place];  Service: Cardiovascular;  Laterality: N/A;   TONSILLECTOMY AND ADENOIDECTOMY     WRIST FRACTURE SURGERY Left    "crushed it"    Social History   Tobacco Use   Smoking status: Former    Packs/day: 1.00    Years: 19.00    Total pack years: 19.00    Types: Cigarettes    Start date: 68    Quit date: 02/11/2004    Years since quitting: 18.3    Passive exposure: Past   Smokeless tobacco: Never  Vaping Use   Vaping Use: Never used  Substance Use Topics   Alcohol use: Yes    Comment: RARE Wine   Drug use: Not Currently    Types: Marijuana    Comment: "only in my teens"    Family History  Problem Relation Age of Onset   CAD Mother    Hypertension Mother    Heart attack Mother    Stroke Mother    CAD Father    Heart attack Father    Allergic rhinitis Father    Asthma Father    Hypertension Brother    Hypertension Brother    Pancreatic cancer Paternal Aunt    Breast cancer Paternal Aunt    Lung cancer Paternal Aunt    Allergic rhinitis Son    Asthma Son    Allergies  Allergen Reactions   Fish Allergy Anaphylaxis    INCLUDES OMEGA 3 OILS   Fish Oil Anaphylaxis and Swelling    THROAT SWELLS INCLUDES FISH AS A CLASS   Omega-3 Fatty Acids Swelling    Throat swelling  Has tolerated Glucerna nutritional drinks    Crestor [Rosuvastatin] Other (See Comments)    Extreme joint pain, to this & other statins   Gabapentin Anxiety    Anxiety on high doses   Lovastatin Other (See  Comments)    EXTREME JOINT PAIN   Metronidazole Nausea Only and Swelling    Headache and shakes Nausea and vomiting Pt tolerating IV 06/14/22   Red Yeast Rice [Cholestin] Other (See Comments)    Muscle pain and severe joint pain.    Rotigotine Swelling    (Neupro) Extreme edema    Atrovent Hfa [Ipratropium Bromide Hfa] Other (See Comments)    wheezing   Iodine Swelling   Other Other (See Comments)    Surgical Staples causes redness/infection per patient.   Pepto-Bismol [Bismuth Subsalicylate] Nausea And Vomiting    Extreme vomiting   Victoza [Liraglutide]     Unsure of reaction    Enalapril Cough   Suprep [Na Sulfate-K Sulfate-Mg Sulf] Nausea And Vomiting   Tape Other (See Comments)    Electrodes causes skin breakdown    OBJECTIVE: Blood pressure 136/66, pulse 80, temperature 97.9 F (36.6 C), temperature source Axillary, resp. rate 17, height $RemoveBe'5\' 2"'gQxIbQDbc$  (1.575 m), weight (!) 140.2 kg, SpO2 98 %.  Physical Exam Constitutional:      Appearance: Normal appearance. She is not ill-appearing.  HENT:     Mouth/Throat:     Mouth: Mucous membranes are moist.     Pharynx: Oropharynx  is clear.  Eyes:     Pupils: Pupils are equal, round, and reactive to light.  Cardiovascular:     Rate and Rhythm: Regular rhythm. Tachycardia present.  Pulmonary:     Effort: Pulmonary effort is normal.     Breath sounds: Normal breath sounds.  Musculoskeletal:        General: Normal range of motion.     Comments: Left foot wrapped in gauze/compression.  R AKA with well healed incision   Neurological:     Mental Status: She is alert and oriented to person, place, and time.     Lab Results Lab Results  Component Value Date   WBC 10.1 06/19/2022   HGB 9.6 (L) 06/19/2022   HCT 35.8 (L) 06/19/2022   MCV 68.3 (L) 06/19/2022   PLT 363 06/19/2022    Lab Results  Component Value Date   CREATININE 0.70 06/19/2022   BUN 9 06/19/2022   NA 139 06/19/2022   K 4.1 06/19/2022   CL 98 06/19/2022    CO2 28 06/19/2022    Lab Results  Component Value Date   ALT 22 06/14/2022   AST 32 06/14/2022   ALKPHOS 69 06/14/2022   BILITOT 0.3 06/14/2022     Microbiology: Recent Results (from the past 240 hour(s))  Blood culture (routine x 2)     Status: None (Preliminary result)   Collection Time: 06/14/22  4:58 PM   Specimen: BLOOD  Result Value Ref Range Status   Specimen Description   Final    BLOOD LEFT ANTECUBITAL Performed at [facility], [address] [place], [place] [address]    Special Requests   Final    BOTTLES DRAWN AEROBIC AND ANAEROBIC Blood Culture adequate volume Performed at [facility], [address] [place], [place] [address]    Culture   Final    NO GROWTH 4 DAYS Performed at [facility], [place] [address] [place], [place] [address]    Report Status PENDING  Incomplete  Blood culture (routine x 2)     Status: Abnormal   Collection Time: 06/14/22  5:02 PM   Specimen: BLOOD  Result Value Ref Range Status   Specimen Description   Final    BLOOD RIGHT ANTECUBITAL Performed at [facility], [address] [place], [place] [address]    Special Requests   Final    BOTTLES DRAWN AEROBIC AND ANAEROBIC Blood Culture results may not be optimal due to an excessive volume of blood received in culture bottles Performed at [facility], [address] [place], [place] [address]    Culture  Setup Time   Final    ANAEROBIC BOTTLE ONLY GRAM NEGATIVE RODS CRITICAL RESULT CALLED TO, READ BACK BY AND VERIFIED WITH:  C/ PHARMD JAMES L. 06/17/22 0355 A. LAFRANCE    Culture (A)  Final    BACTEROIDES FRAGILIS BETA LACTAMASE POSITIVE Performed at [facility], [place] [address], [place], [place] [address]    Report Status 06/19/2022 FINAL  Final  Blood Culture ID Panel (Reflexed)     Status: Abnormal   Collection Time: 06/14/22  5:02 PM  Result Value Ref Range Status   Enterococcus faecalis NOT DETECTED NOT  DETECTED Final   Enterococcus Faecium NOT DETECTED NOT DETECTED Final   Listeria monocytogenes NOT DETECTED NOT DETECTED Final   Staphylococcus species NOT DETECTED NOT DETECTED Final   Staphylococcus aureus (BCID) NOT DETECTED NOT  DETECTED Final   Staphylococcus epidermidis NOT DETECTED NOT DETECTED Final   Staphylococcus lugdunensis NOT DETECTED NOT DETECTED Final   Streptococcus species NOT DETECTED NOT DETECTED Final   Streptococcus agalactiae NOT DETECTED NOT DETECTED Final   Streptococcus pneumoniae NOT DETECTED NOT DETECTED Final   Streptococcus pyogenes NOT DETECTED NOT DETECTED Final   A.calcoaceticus-baumannii NOT DETECTED NOT DETECTED Final   Bacteroides fragilis DETECTED (A) NOT DETECTED Final    Comment: CRITICAL RESULT CALLED TO, READ BACK BY AND VERIFIED WITH:  C/ PHARMD JAMES L. 06/17/22 0355 A. LAFRANCE    Enterobacterales NOT DETECTED NOT DETECTED Final   Enterobacter cloacae complex NOT DETECTED NOT DETECTED Final   Escherichia coli NOT DETECTED NOT DETECTED Final   Klebsiella aerogenes NOT DETECTED NOT DETECTED Final   Klebsiella oxytoca NOT DETECTED NOT DETECTED Final   Klebsiella pneumoniae NOT DETECTED NOT DETECTED Final   Proteus species NOT DETECTED NOT DETECTED Final   Salmonella species NOT DETECTED NOT DETECTED Final   Serratia marcescens NOT DETECTED NOT DETECTED Final   Haemophilus influenzae NOT DETECTED NOT DETECTED Final   Neisseria meningitidis NOT DETECTED NOT DETECTED Final   Pseudomonas aeruginosa NOT DETECTED NOT DETECTED Final   Stenotrophomonas maltophilia NOT DETECTED NOT DETECTED Final   Candida albicans NOT DETECTED NOT DETECTED Final   Candida auris NOT DETECTED NOT DETECTED Final   Candida glabrata NOT DETECTED NOT DETECTED Final   Candida krusei NOT DETECTED NOT DETECTED Final   Candida parapsilosis NOT DETECTED NOT DETECTED Final   Candida tropicalis NOT DETECTED NOT DETECTED Final   Cryptococcus neoformans/gattii NOT DETECTED NOT  DETECTED Final    Comment: Performed at [facility], [address], [place], [place] [address]  MRSA Next Gen by PCR, Nasal     Status: Abnormal   Collection Time: 06/15/22  4:44 AM   Specimen: Nasal Mucosa; Nasal Swab  Result Value Ref Range Status   MRSA by PCR Next Gen DETECTED (A) NOT DETECTED Final    Comment: CRITICAL RESULT CALLED TO, READ BACK BY AND VERIFIED WITH:  C/ ABRAHAM O., RN 06/15/22 5778 A. LAFRANCE (NOTE) The GeneXpert MRSA Assay (FDA approved for NASAL specimens only), is one component of a comprehensive MRSA colonization surveillance program. It is not intended to diagnose MRSA infection nor to guide or monitor treatment for MRSA infections. Test performance is not FDA approved in patients less than 2 years old. Performed at [facility], [address], [place], [place] [address]   Culture, blood (Routine X 2) w Reflex to ID Panel     Status: None (Preliminary result)   Collection Time: 06/17/22 12:20 PM   Specimen: BLOOD  Result Value Ref Range Status   Specimen Description BLOOD LEFT ANTECUBITAL  Final   Special Requests   Final    BOTTLES DRAWN AEROBIC AND ANAEROBIC Blood Culture adequate volume   Culture   Final    NO GROWTH < 24 HOURS Performed at [facility], [address], [place], [place] [address]    Report Status PENDING  Incomplete  Culture, blood (Routine X 2) w Reflex to ID Panel     Status: None (Preliminary result)   Collection Time: 06/17/22 12:27 PM   Specimen: BLOOD  Result Value Ref Range Status   Specimen Description BLOOD BLOOD LEFT HAND  Final   Special Requests   Final    BOTTLES DRAWN AEROBIC AND ANAEROBIC Blood Culture adequate volume   Culture   Final  NO GROWTH < 24 HOURS Performed at [place] [address], [place], [place] [address]    Report Status PENDING  Incomplete   Imaging MR FOOT LEFT WO CONTRAST  Result Date: 06/15/2022 CLINICAL DATA:  Foot swelling. Diabetic. Osteomyelitis  suspected. Left foot ulcer for about a month. EXAM: MRI OF THE LEFT FOOT WITHOUT CONTRAST TECHNIQUE: Multiplanar, multisequence MR imaging of the left forefoot was performed. No intravenous contrast was administered. COMPARISON:  left foot radiographs 06/14/2022 and 05/26/2021; CT left foot 01/04/2018 FINDINGS: Despite efforts by the technologist and patient, motion artifact is present on today's exam and could not be eliminated. This reduces exam sensitivity and specificity. Bones/Joint/Cartilage Note is made of the plantar lateral midfoot soft tissue ulcer in the region of the cuboid through the mid shaft of the fifth metatarsal as described below. There is again an old healed fracture of the fifth metatarsal shaft. No marrow edema is seen, with attention to the cuboid and fifth metatarsal bones closest to the soft tissue ulceration and soft tissue edema. Mild-to-moderate great toe metatarsophalangeal joint, first through fifth interphalangeal joint osteoarthritis. Moderate second and mild first, third, fourth, and fifth tarsometatarsal cartilage thinning and peripheral osteophytosis. Similar mild-to-moderate osteoarthritis of the navicular-cuneiform articulations. Ligaments The Lisfranc ligament complex is intact. Muscles and Tendons Mild-to-moderate edema throughout the intrinsic midfoot musculature, nonspecific myositis. Moderate fatty infiltration of the abductor digiti minimi muscle. There is also fatty infiltration of a proximal posterior aspect of the flexor digitorum brevis and abductor digiti minimi muscles. Soft tissues There is attenuation and irregularity of the plantar lateral midfoot soft tissues, with apparent ulceration of the overlying skin to the level of the subcutaneous fat in a region measuring up to approximately 2.9 cm along the short axis of the foot (axial series 4, image 22) and 4.2 cm along the longitudinal dimension of the foot (sagittal series 8, image 7). There is also edema within  the adjacent abductor digiti minimi muscle body along the course of a fifth metatarsal. There is moderate subcutaneous fat edema throughout the lateral, plantar, medial, and dorsal aspects of the midfoot and forefoot, greatest laterally. There is mild complex fluid within the deep aspect of the subcutaneous fat of the dorsal midfoot diffusely (series 5 images 11 through 20). IMPRESSION: 1. There is a broad soft tissue ulceration within the plantar lateral midfoot from the level of the cuboid through the mid fifth metatarsal shaft. Within the limitations of motion artifact, no definite bone marrow edema or cortical erosion to indicate acute osteomyelitis. 2. Old healed fracture of the fifth metatarsal shaft. 3. Moderate subcutaneous fat edema throughout the midfoot and forefoot. Findings are compatible with cellulitis. No walled-off fluid collection/abscess is seen. 4. Mild-to-moderate edema throughout the midfoot musculature, nonspecific myositis. Electronically Signed   By: Yvonne Kendall M.D.   On: 06/15/2022 12:39   DG Foot Complete Left  Result Date: 06/14/2022 CLINICAL DATA:  Wound at bottom of LEFT foot for 4 weeks, question osteomyelitis; history of gout and diabetes mellitus EXAM: LEFT FOOT - COMPLETE 3+ VIEW COMPARISON:  05/26/2021 FINDINGS: Osseous demineralization. Old healed posttraumatic deformity distal fifth metatarsal. Joint spaces preserved. Plantar calcaneal spur. No acute fracture, dislocation, or bone destruction. No definite soft tissue gas. IMPRESSION: No acute osseous abnormalities. Osseous demineralization with healed posttraumatic deformity of distal fifth metatarsal. Electronically Signed   By: Lavonia Dana M.D.   On: 06/14/2022 17:03     Janene Madeira, MSN, NP-C [facility] Infectious Disease [facility]  [email or web address] Pager: [phone] Office: [phone] [facility] Main Line: [place] Communication Welcome

## 2022-06-19 NOTE — Evaluation (Signed)
Physical Therapy Evaluation Patient Details Name: [name] MRN: [id number] DOB: [birth year] Today's Date: 06/19/2022  History of Present Illness  61 year old female who was admitted for the concern of left foot diabetic wound infection. MRI suggestive of cellulitis.  History of morbid obesity, diabetes type 2, right AKA, irritable bowel syndrome, hypertension, hyperlipidemia, hypothyroidism, rheumatoid arthritis, asthma  Clinical Impression  Pt admitted secondary to problem above with deficits below. Pt requiring min guard for transfers using RW this session. Pt reports feeling somewhat weaker than normal and is interested in HHPT at d/c. Will continue to follow acutely.        Recommendations for follow up therapy are one component of a multi-disciplinary discharge planning process, led by the attending physician.  Recommendations may be updated based on patient status, additional functional criteria and insurance authorization.  Follow Up Recommendations Home health PT      Assistance Recommended at Discharge Intermittent Supervision/Assistance  Patient can return home with the following  A little help with bathing/dressing/bathroom;Assistance with cooking/housework;Help with stairs or ramp for entrance;Assist for transportation;A little help with walking and/or transfers    Equipment Recommendations None recommended by PT  Recommendations for Other Services       Functional Status Assessment Patient has had a recent decline in their functional status and demonstrates the ability to make significant improvements in function in a reasonable and predictable amount of time.     Precautions / Restrictions Precautions Precautions: Fall Required Braces or Orthoses: Other Brace Other Brace: prevalon - encouraged to wear when sleeping or when taking naps Restrictions Weight Bearing Restrictions: No      Mobility  Bed Mobility               General bed mobility comments:  Sitting EOB upon entry    Transfers Overall transfer level: Needs assistance Equipment used: Rolling walker (2 wheels) Transfers: Sit to/from Stand, Bed to chair/wheelchair/BSC Sit to Stand: Supervision Stand pivot transfers: Min guard         General transfer comment: Pivotal step to chair on RLE. Hopped a few steps, but pt with increased unsteadiness.    Ambulation/Gait                  Stairs            Wheelchair Mobility    Modified Rankin (Stroke Patients Only)       Balance Overall balance assessment: Needs assistance Sitting-balance support: No upper extremity supported Sitting balance-[name]: Good     Standing balance support: Reliant on assistive device for balance, Bilateral upper extremity supported Standing balance-[name]: Poor                               Pertinent Vitals/Pain Pain Assessment Pain Assessment: 0-10 Pain Score: 6  Pain Location: L foot Pain Descriptors / Indicators: Grimacing, Guarding Pain Intervention(s): Limited activity within patient's tolerance, Monitored during session, Repositioned    Home Living Family/patient expects to be discharged to:: Private residence Living Arrangements: Spouse/significant other Available Help at Discharge: Family;Available 24 hours/day Type of Home: House Home Access: Ramped entrance     Alternate Level Stairs-Number of Steps: flight Home Layout: Two level;Able to live on main level with bedroom/bathroom Home Equipment: Tub bench;BSC/3in1;Rolling Walker (2 wheels);Rollator (4 wheels);Wheelchair - Insurance risk surveyor Comments: Uses BSC and places it near the toilet.    Prior Function Prior Level of Function : Needs assist  Mobility Comments: patient states she performs stand pivot transfer to [facility] when husband not present. States she has not been as mobilite lately. At baseline "hops @ 10 ft" "I've been in my bedroom for 3  weeks". Husband helps as needed; he does her dressing changes ADLs Comments: Pt able to transfer onto tub bench with supervision. Needs assist to put sock on     Hand Dominance   Dominant Hand: Right    Extremity/Trunk Assessment   Upper Extremity Assessment Upper Extremity Assessment: Defer to OT evaluation    Lower Extremity Assessment Lower Extremity Assessment: LLE deficits/detail;RLE deficits/detail RLE Deficits / Details: R AKA at baseline LLE Deficits / Details: L foot wrapped    Cervical / Trunk Assessment Cervical / Trunk Assessment: Normal  Communication   Communication: No difficulties  Cognition Arousal/Alertness: Awake/alert Behavior During Therapy: WFL for tasks assessed/performed Overall Cognitive Status: Within Functional Limits for tasks assessed                                          General Comments      Exercises     Assessment/Plan    PT Assessment Patient needs continued PT services  PT Problem List Decreased strength;Decreased activity tolerance;Decreased balance;Decreased mobility       PT Treatment Interventions Functional mobility training;DME instruction;Therapeutic activities;Therapeutic exercise;Balance training;Patient/family education    PT Goals (Current goals can be found in the Care Plan section)  Acute Rehab PT Goals Patient Stated Goal: to be able to cook a meal at home PT Goal Formulation: With patient Time For Goal Achievement: 07/03/22 Potential to Achieve Goals: Good    Frequency Min 3X/week     Co-evaluation PT/OT/SLP Co-Evaluation/Treatment: Yes Reason for Co-Treatment: For patient/therapist safety PT goals addressed during session: Balance;Proper use of DME         AM-PAC PT "6 Clicks" Mobility  Outcome Measure Help needed turning from your back to your side while in a flat bed without using bedrails?: None Help needed moving from lying on your back to sitting on the side of a flat bed  without using bedrails?: A Little Help needed moving to and from a bed to a chair (including a wheelchair)?: A Little Help needed standing up from a chair using your arms (e.g., wheelchair or bedside chair)?: A Little Help needed to walk in hospital room?: Total Help needed climbing 3-5 steps with a railing? : Total 6 Click Score: 15    End of Session   Activity Tolerance: Patient tolerated treatment well Patient left: in chair;with call bell/phone within reach Nurse Communication: Mobility status PT Visit Diagnosis: Unsteadiness on feet (R26.81);Muscle weakness (generalized) (M62.81);Difficulty in walking, not elsewhere classified (R26.2)    Time: 8938-1017 PT Time Calculation (min) (ACUTE ONLY): 32 min   Charges:   PT Evaluation $PT Eval Moderate Complexity: 1 Mod          Farley Ly, PT, DPT  Acute Rehabilitation Services  Office: [phone]   Lehman Prom 06/19/2022, 3:59 PM

## 2022-06-19 NOTE — Plan of Care (Signed)

## 2022-06-19 NOTE — Progress Notes (Signed)
Pt hardly slept last night and she is tired and asleep. This Probation officer spoke with the wound RN and agreed to retime wound care to 1000

## 2022-06-19 NOTE — Consult Note (Signed)
ORTHOPAEDIC CONSULTATION  REQUESTING PHYSICIAN: Burnadette Pop, MD  Chief Complaint: Ulceration lateral aspect left foot.  HPI: [name] is a 61 y.o. female who presents with a several week history of ulceration lateral aspect left foot with necrotic soft tissue.  Past Medical History:  Diagnosis Date   Aortic stenosis    mild AS by echo 02/2021   Asthma    "on daily RX and rescue inhaler" (04/18/2018)   Chronic diastolic CHF (congestive heart failure) (HCC) 09/2015   Chronic lower back pain    Chronic neck pain    Chronic pain syndrome    Fentanyl Patch   Colon polyps    Coronary artery disease    cath with normal LM, 30% LAD, 85% mid RCA and 95% distal RCA s/p PCI of the mid to distal RCA and now on DAPT with ASA and Ticagrelor.     Eczema    Excessive daytime sleepiness 11/26/2015   Fibromyalgia    Gallstones    GERD (gastroesophageal reflux disease)    Heart murmur    "noted for the 1st time on 04/18/2018"   History of blood transfusion 07/2010   "S/P oophorectomy"   History of gout    History of hiatal hernia 1980s   "gone now" (04/18/2018)   Hyperlipidemia    Hypertension    takes Metoprolol and Enalapril daily   Hypothyroidism    takes Synthroid daily   IBS (irritable bowel syndrome)    Migraine    "nothing in the 2000s" (04/18/2018)   Mixed connective tissue disease (HCC)    NAFLD (nonalcoholic fatty liver disease)    Pneumonia    "several times" (04/18/2018)   PVC's (premature ventricular contractions)    noted on event monitor 02/2021   Rheumatoid arthritis (HCC)    "hands, elbows, shoulders, probably knees" (04/18/2018)   Scoliosis    Sjogren's syndrome (HCC)    Sleep apnea    mild - does not use cpap   Spondylosis    Type II diabetes mellitus (HCC)    takes Metformin and Hum R daily (04/18/2018)   Walker as ambulation aid    also uses wheelchair   Past Surgical History:  Procedure Laterality Date   ABDOMINAL HYSTERECTOMY  06/2005   "w/right  ovariy"   AMPUTATION Right 01/28/2020    RIGHT FOURTH AND FIFTH RAY AMPUTATION   AMPUTATION Right 01/28/2020   Procedure: RIGHT FOURTH AND FIFTH RAY AMPUTATION,;  Surgeon: [name], MD;  Location: [facility] OR;  Service: Orthopedics;  Laterality: Right;   AMPUTATION Right 06/01/2021   Procedure: RIGHT BELOW KNEE AMPUTATION;  Surgeon: [name], MD;  Location: [facility] OR;  Service: Orthopedics;  Laterality: Right;   AMPUTATION Right 07/22/2021   Procedure: RIGHT ABOVE KNEE AMPUTATION;  Surgeon: [name], MD;  Location: [facility] OR;  Service: Orthopedics;  Laterality: Right;   APPENDECTOMY     BREAST BIOPSY Bilateral    9 total (04/18/2018)   CORONARY ANGIOPLASTY WITH STENT PLACEMENT  10/01/2015   normal LM, 30% LAD, 85% mid RCA and 95% distal RCA s/p PCI of the mid to distal RCA and now on DAPT with ASA and Ticagrelor.     CORONARY STENT INTERVENTION Right 04/18/2018   Procedure: CORONARY STENT INTERVENTION;  Surgeon: [name], MD;  Location: [facility];  Service: Cardiovascular;  Laterality: Right;   DILATION AND CURETTAGE OF UTERUS     FRACTURE SURGERY     LAPAROSCOPIC CHOLECYSTECTOMY  LEFT HEART CATH AND CORONARY ANGIOGRAPHY N/A 04/18/2018   Procedure: LEFT HEART CATH AND CORONARY ANGIOGRAPHY;  Surgeon: [name], MD;  Location: [facility];  Service: Cardiovascular;  Laterality: N/A;   MUSCLE BIOPSY Left    "leg"   OOPHORECTOMY  07/2010   RADIOLOGY WITH ANESTHESIA N/A 05/25/2016   Procedure: RADIOLOGY WITH ANESTHESIA;  Surgeon: Medication Radiologist, MD;  Location: [facility] OR;  Service: Radiology;  Laterality: N/A;   STUMP REVISION Right 06/29/2021   Procedure: REVISION RIGHT BELOW KNEE AMPUTATION;  Surgeon: [name], MD;  Location: [facility] OR;  Service: Orthopedics;  Laterality: Right;   TEE WITHOUT CARDIOVERSION N/A 06/01/2021   Procedure: TRANSESOPHAGEAL ECHOCARDIOGRAM (TEE);  Surgeon: Sande Rives, MD;  Location: [facility] OR;  Service:  Cardiovascular;  Laterality: N/A;   TONSILLECTOMY AND ADENOIDECTOMY     WRIST FRACTURE SURGERY Left    "crushed it"   Social History   Socioeconomic History   Marital status: Married    Spouse name: [name] Mally   Number of children: 1   Years of education: 11   Highest education level: GED or equivalent  Occupational History    Comment: Not currrently working ( last worked 2004)  Tobacco Use   Smoking status: Former    Packs/day: 1.00    Years: 19.00    Total pack years: 19.00    Types: Cigarettes    Start date: 66    Quit date: 02/11/2004    Years since quitting: 18.3    Passive exposure: Past   Smokeless tobacco: Never  Vaping Use   Vaping Use: Never used  Substance and Sexual Activity   Alcohol use: Yes    Comment: RARE Wine   Drug use: Not Currently    Types: Marijuana    Comment: "only in my teens"   Sexual activity: Yes    Birth control/protection: Surgical    Comment: Hysterectomy  Other Topics Concern   Not on file  Social History Narrative   Not on file   Social Determinants of Health   Financial Resource Strain: Low Risk  (12/27/2021)   Overall Financial Resource Strain (CARDIA)    Difficulty of Paying Living Expenses: Not hard at all  Food Insecurity: No Food Insecurity (06/14/2022)   Hunger Vital Sign    Worried About Running Out of Food in the Last Year: Never true    Ran Out of Food in the Last Year: Never true  Transportation Needs: No Transportation Needs (06/14/2022)   PRAPARE - Administrator, Civil Service (Medical): No    Lack of Transportation (Non-Medical): No  Physical Activity: Inactive (08/25/2021)   Exercise Vital Sign    Days of Exercise per Week: 0 days    Minutes of Exercise per Session: 0 min  Stress: Stress Concern Present (08/25/2021)   [employer] of Occupational Health - Occupational Stress Questionnaire    Feeling of Stress : Very much  Social Connections: Moderately Integrated (08/25/2021)   Social  Connection and Isolation Panel [NHANES]    Frequency of Communication with Friends and Family: More than three times a week    Frequency of Social Gatherings with Friends and Family: More than three times a week    Attends Religious Services: More than 4 times per year    Active Member of [employer] or Organizations: No    Attends Banker Meetings: Never    Marital Status: Married   Family History  Problem Relation Age of Onset  CAD Mother    Hypertension Mother    Heart attack Mother    Stroke Mother    CAD Father    Heart attack Father    Allergic rhinitis Father    Asthma Father    Hypertension Brother    Hypertension Brother    Pancreatic cancer Paternal Aunt    Breast cancer Paternal Aunt    Lung cancer Paternal Aunt    Allergic rhinitis Son    Asthma Son    - negative except otherwise stated in the family history section Allergies  Allergen Reactions   Fish Allergy Anaphylaxis    INCLUDES OMEGA 3 OILS   Fish Oil Anaphylaxis and Swelling    THROAT SWELLS INCLUDES FISH AS A CLASS   Omega-3 Fatty Acids Swelling    Throat swelling  Has tolerated Glucerna nutritional drinks    Crestor [Rosuvastatin] Other (See Comments)    Extreme joint pain, to this & other statins   Gabapentin Anxiety    Anxiety on high doses   Lovastatin Other (See Comments)    EXTREME JOINT PAIN   Metronidazole Nausea Only and Swelling    Headache and shakes Nausea and vomiting Pt tolerating IV 06/14/22   Red Yeast Rice [Cholestin] Other (See Comments)    Muscle pain and severe joint pain.    Rotigotine Swelling    (Neupro) Extreme edema    Atrovent Hfa [Ipratropium Bromide Hfa] Other (See Comments)    wheezing   Iodine Swelling   Other Other (See Comments)    Surgical Staples causes redness/infection per patient.   Pepto-Bismol [Bismuth Subsalicylate] Nausea And Vomiting    Extreme vomiting   Victoza [Liraglutide]     Unsure of reaction    Enalapril Cough   Suprep [Na  Sulfate-K Sulfate-Mg Sulf] Nausea And Vomiting   Tape Other (See Comments)    Electrodes causes skin breakdown   Prior to Admission medications   Medication Sig Start Date End Date Taking? Authorizing Provider  acetaminophen (TYLENOL) 500 MG tablet Take 500 mg by mouth every 8 (eight) hours as needed for mild pain.   Yes [provider]  albuterol (PROVENTIL) (2.5 MG/3ML) 0.083% nebulizer solution USE 1 VIAL IN NEBULIZER EVERY 4 HOURS AS NEEDED FOR WHEEZING FOR SHORTNESS OF BREATH Patient taking differently: 2.5 mg every 4 (four) hours as needed for wheezing or shortness of breath. 08/23/21  Yes Sharlene Dory, DO  ascorbic acid (VITAMIN C) 500 MG tablet Take 1 tablet (500 mg total) by mouth 2 (two) times daily. Patient taking differently: Take 500 mg by mouth daily. 06/24/21  Yes Love, Evlyn Kanner, PA-C  aspirin EC 81 MG tablet Take 1 tablet (81 mg total) by mouth every evening. 10/17/21  Yes Sharlene Dory, DO  augmented betamethasone dipropionate (DIPROLENE AF) 0.05 % cream Apply topically 2 (two) times daily. Patient taking differently: Apply 1 Application topically 2 (two) times daily as needed (rash). 11/16/21  Yes Wendling, Jilda Roche, DO  cyclobenzaprine (FLEXERIL) 10 MG tablet Take 10 mg by mouth 3 (three) times daily as needed for muscle spasms.   Yes [provider]  dapagliflozin propanediol (FARXIGA) 10 MG TABS tablet Take 1 tablet (10 mg total) by mouth daily before breakfast. 05/10/22  Yes Wendling, Jilda Roche, DO  diclofenac Sodium (VOLTAREN) 1 % GEL Apply 2 g topically 4 (four) times daily. 06/24/21  Yes Love, Evlyn Kanner, PA-C  DILT-XR 240 MG 24 hr capsule Take 1 capsule (240 mg total) by mouth daily. Patient  taking differently: Take 240 mg by mouth at bedtime. 04/18/22  Yes Turner, Traci R, MD  EPINEPHrine (EPIPEN 2-PAK) 0.3 mg/0.3 mL IJ SOAJ injection USE AS DIRECTED FOR SEVERE ALLERGIC REACTION. 08/24/21  Yes Wendling, Crosby Oyster, DO   fluticasone (FLONASE) 50 MCG/ACT nasal spray USE 2 SPRAY(S) IN EACH NOSTRIL ONCE DAILY AS NEEDED FOR ALLERGIES OR  RHINITIS Patient taking differently: Place 2 sprays into both nostrils daily as needed for allergies or rhinitis. 04/17/22  Yes Shelda Pal, DO  furosemide (LASIX) 20 MG tablet Take 2 tablets by mouth once daily 06/07/22  Yes Wendling, Crosby Oyster, DO  gabapentin (NEURONTIN) 400 MG capsule TAKE 1 CAPSULE BY MOUTH IN THE MORNING AND 1 AT NOON AND 2 AT BEDTIME Patient taking differently: Take 400-800 mg by mouth See admin instructions. Taking 1 capsule ( 400 mg) in the AM and at noon and 2 capsules ( 800 mg) at bedtime 05/09/22  Yes Wendling, Crosby Oyster, DO  GLUCOMANNAN PO Take 1,995 mg by mouth daily.   Yes [provider]  guaiFENesin (MUCINEX) 600 MG 12 hr tablet Take 600 mg by mouth 2 (two) times daily as needed for cough or to loosen phlegm.   Yes [provider]  hydrocortisone cream 1 % Apply 1 application topically 3 (three) times daily as needed for itching (minor skin irritation). 06/24/21  Yes Love, Ivan Anchors, PA-C  hydroxychloroquine (PLAQUENIL) 200 MG tablet Take 200mg  by mouth twice daily Monday through Friday only. None on Saturday or Sunday. 05/09/22  Yes Deveshwar, Abel Presto, MD  insulin regular human CONCENTRATED (HUMULIN R) 500 UNIT/ML injection INJECT 0.08-0.3 MLS (40-150 UNITS TOTAL) INTO THE SKIN THREE TIMES DAILY WITH MEALS Patient taking differently: Inject 6-65 Units into the skin 3 (three) times daily with meals. 01/11/22  Yes Shelda Pal, DO  isosorbide mononitrate (IMDUR) 30 MG 24 hr tablet Take 1 tablet by mouth once daily Patient taking differently: Take 30 mg by mouth at bedtime. 05/09/22  Yes Turner, Eber Hong, MD  levocetirizine (XYZAL) 5 MG tablet TAKE 1 TABLET BY MOUTH ONCE DAILY IN THE EVENING Patient taking differently: Take 5 mg by mouth every evening. 05/09/22  Yes Wendling, Crosby Oyster, DO  levothyroxine (SYNTHROID)  200 MCG tablet TAKE 1 TABLET BY MOUTH ONCE DAILY BEFORE BREAKFAST Patient taking differently: Take 200 mcg by mouth daily before breakfast. 05/09/22  Yes Wendling, Crosby Oyster, DO  metFORMIN (GLUCOPHAGE-XR) 500 MG 24 hr tablet Take 2 tablets by mouth twice daily Patient taking differently: Take 1,000 mg by mouth 2 (two) times daily with a meal. 05/09/22  Yes Wendling, Crosby Oyster, DO  mometasone-formoterol (DULERA) 100-5 MCG/ACT AERO Inhale 2 puffs into the lungs 2 (two) times daily as needed for wheezing or shortness of breath.   Yes [provider]  Multiple Vitamins-Minerals (WOMENS 50+ MULTI VITAMIN PO) Take 1 tablet by mouth daily.   Yes [provider]  mupirocin ointment (BACTROBAN) 2 % Apply 1 application topically 2 (two) times daily as needed (wound care). 11/14/21  Yes Wendling, Crosby Oyster, DO  naloxone [facility]) nasal spray 4 mg/0.1 mL Place 1 spray into the nose as needed (opoid overdose).   Yes [provider]  nitroGLYCERIN (NITROSTAT) 0.4 MG SL tablet DISSOLVE ONE TABLET UNDER THE TONGUE EVERY 5 MINUTES AS NEEDED FOR CHEST PAIN.  DO NOT EXCEED A TOTAL OF 3 DOSES IN 15 MINUTES Patient taking differently: Place 0.4 mg under the tongue every 5 (five) minutes as needed for chest pain. 08/23/21  Yes Turner, Cornelious Bryant, MD  nystatin (MYCOSTATIN/NYSTOP) powder Apply 1 Application topically 3 (three) times daily. RLE Patient taking differently: Apply 1 Application topically 3 (three) times daily as needed (skin rash). RLE 03/17/22  Yes Wendling, Jilda Roche, DO  omeprazole (PRILOSEC) 40 MG capsule Take 1 capsule by mouth once daily Patient taking differently: Take 40 mg by mouth at bedtime. 05/09/22  Yes Sharlene Dory, DO  Oxycodone HCl 20 MG TABS Take 1 tablet (20 mg total) by mouth every 8 (eight) hours as needed. Patient taking differently: Take 20 mg by mouth every 8 (eight) hours as needed (pain). 05/02/22  Yes Jones Bales, NP  oxymetazoline (AFRIN)  0.05 % nasal spray Place 2 sprays into both nostrils 2 (two) times daily as needed for congestion.   Yes [provider]  Polyethyl Glycol-Propyl Glycol (SYSTANE) 0.4-0.3 % SOLN Place 1-2 drops into both eyes in the morning.   Yes [provider]  pramipexole (MIRAPEX) 1 MG tablet TAKE 1 TABLET BY MOUTH THREE TIMES DAILY AS NEEDED AND 1/2 (ONE-HALF) TABLET TWICE DAILY AS NEEDED FOR  BREAKTHROUGH  RESTLESS  LEG Patient taking differently: Take 0.5-1 mg by mouth See admin instructions. Taking 1 mg three times daily and 1/2 tab (0.5 mg) twice daily as needed for restless leg. 06/07/22  Yes Wendling, Jilda Roche, DO  pregabalin (LYRICA) 50 MG capsule Take 1 capsule (50 mg total) by mouth 3 (three) times daily. 02/27/22  Yes Raulkar, Drema Pry, MD  promethazine (PHENERGAN) 25 MG tablet Take 1 tablet (25 mg total) by mouth every 8 (eight) hours as needed for vomiting or nausea. 07/05/21  Yes Wendling, Jilda Roche, DO  saccharomyces boulardii (FLORASTOR) 250 MG capsule Take 250 mg by mouth 3 (three) times daily.   Yes [provider]  VENTOLIN HFA 108 (90 Base) MCG/ACT inhaler INHALE 2 PUFFS BY MOUTH EVERY 4 HOURS AS NEEDED FOR WHEEZING AND  FOR  SHORTNESS  OF  BREATH Patient taking differently: Inhale 2 puffs into the lungs every 4 (four) hours as needed for wheezing or shortness of breath. 05/09/22  Yes Wendling, Jilda Roche, DO  Vitamin D, Ergocalciferol, (DRISDOL) 1.25 MG (50000 UNIT) CAPS capsule TAKE 1 CAPSULE BY MOUTH ONCE A WEEK **TAKE  ON  FRIDAY** Patient taking differently: Take 50,000 Units by mouth every 7 (seven) days.  **TAKE  ON  FRIDAY** 05/09/22  Yes Wendling, Jilda Roche, DO  doxycycline (VIBRA-TABS) 100 MG tablet Take 1 tablet (100 mg total) by mouth 2 (two) times daily. 06/14/22   Sharlene Dory, DO  ezetimibe (ZETIA) 10 MG tablet Take 1 tablet (10 mg total) by mouth daily. Patient not taking: Reported on 06/15/2022 09/21/21   Sharlene Dory,  DO  glucose blood test strip 1 each by Other route 4 (four) times daily. Contour Next strips 06/24/21   Love, Evlyn Kanner, PA-C  INSULIN SYRINGE 1CC/29G (B-D INSULIN SYRINGE) 29G X 1/2" 1 ML MISC Use three times daily with insulin.  DX E11.9 06/11/20   Sharlene Dory, DO  triamcinolone cream (KENALOG) 0.1 % Apply 1 Application topically 3 (three) times daily. RLE Patient not taking: Reported on 06/15/2022 03/17/22   Sharlene Dory, DO   No results found. - pertinent xrays, CT, MRI studies were reviewed and independently interpreted  Positive ROS: All other systems have been reviewed and were otherwise negative with the exception of those mentioned in the HPI and as above.  Physical Exam: General: Alert, no acute distress Psychiatric:  Patient is competent for consent with normal mood and affect Lymphatic: No axillary or cervical lymphadenopathy Cardiovascular: No pedal edema Respiratory: No cyanosis, no use of accessory musculature GI: No organomegaly, abdomen is soft and non-tender    Images:  @ENCIMAGES @  Labs:  Lab Results  Component Value Date   HGBA1C 9.2 (H) 05/26/2022   HGBA1C 8.5 (H) 12/22/2021   HGBA1C 7.3 (H) 05/27/2021   ESRSEDRATE 124 Repeated and verified X2. (H) 06/14/2022   ESRSEDRATE 34 (H) 05/26/2022   ESRSEDRATE 31 (H) 04/07/2022   CRP 9.1 06/14/2022   CRP 9.8 (H) 01/02/2018   REPTSTATUS PENDING 06/17/2022   GRAMSTAIN  01/28/2020    ABUNDANT WBC PRESENT,BOTH PMN AND MONONUCLEAR ABUNDANT GRAM POSITIVE COCCI ABUNDANT GRAM NEGATIVE RODS FEW GRAM POSITIVE RODS    CULT  06/17/2022    NO GROWTH < 24 HOURS Performed at [facility], [address] [place], [place]    LABORGA ESCHERICHIA COLI (A) 06/21/2021   LABORGA PROTEUS MIRABILIS (A) 06/21/2021    Lab Results  Component Value Date   ALBUMIN 3.0 (L) 06/14/2022   ALBUMIN 3.0 (L) 12/23/2021   ALBUMIN 3.1 (L) 12/20/2021        Latest Ref Rng & Units 06/19/2022    2:26  AM 06/17/2022    2:17 AM 06/16/2022    2:54 AM  CBC EXTENDED  WBC 4.0 - 10.5 K/uL 10.1  10.6  11.7   RBC 3.87 - 5.11 MIL/uL 5.24  4.83  4.74   Hemoglobin 12.0 - 15.0 g/dL 9.6  8.9  8.8   HCT 08/16/2022 - 46.0 % 35.8  32.7  32.2   Platelets 150 - 400 K/uL 363  343  322     Neurologic: Patient does not have protective sensation bilateral lower extremities.   MUSCULOSKELETAL:   Skin: Examination patient has a soft necrotic ulcer over the lateral aspect of the left foot.  This extends down to bone.  Patient has a palpable dorsalis pedis pulse.  Ankle-brachial indices previously showed good circulation as well.  Review of the MRI scan does not show acute osteomyelitis however the necrotic ulcer extends down to bone.  Hemoglobin A1c 9.2. 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  Plan: Plan: Discussed with patient recommendation to proceed with amputation surgery.  Discussed that I do not feel that wound debridement or tissue graft would be helpful with the ulcer extending down to bone.  Patient states she would like to try antibiotics and wound care and follow-up with me in the office.  Discussed that she could complete a few more days of IV antibiotics with discharge with oral antibiotics and patient wants to try a specific wound care ointment which I feel would be fine.  I will follow-up in the office in 1 week.  Thank you for the consult and the opportunity to see Ms. Buddenhagen  69.6, MD [facility] [phone] 8:26 AM

## 2022-06-19 NOTE — Evaluation (Signed)
Occupational Therapy Evaluation Patient Details Name: [name] MRN: [id number] DOB: 06/13/1961 Today's Date: 06/19/2022   History of Present Illness 61 year old female who was admitted for the concern of left foot diabetic wound infection. MRI suggestive of cellulitis.  History of morbid obesity, diabetes type 2, right AKA, irritable bowel syndrome, hypertension, hyperlipidemia, hypothyroidism, rheumatoid arthritis, asthma   Clinical Impression   PTA pt primarily uses her wc for mobility and is able to complete her self care with DME and AE that  she has at home. If needed, Her supportive husband also helps as with IADL and ADL tasks. Pt states she has not been out of her room in 3 weeks due to LLE pain and feels she is "weaker" than normal. Pt would like to be able to be more independent with IADL tasks within her home and is interested in following up with HHOT.  Will follow to facilitate safe DC home.      Recommendations for follow up therapy are one component of a multi-disciplinary discharge planning process, led by the attending physician.  Recommendations may be updated based on patient status, additional functional criteria and insurance authorization.   Follow Up Recommendations  Home health OT    Assistance Recommended at Discharge Intermittent Supervision/Assistance  Patient can return home with the following A little help with walking and/or transfers;A little help with bathing/dressing/bathroom;Assistance with cooking/housework;Assist for transportation;Help with stairs or ramp for entrance    Functional Status Assessment  Patient has had a recent decline in their functional status and demonstrates the ability to make significant improvements in function in a reasonable and predictable amount of time.  Equipment Recommendations  Other (comment)    Recommendations for Other Services       Precautions / Restrictions Precautions Precautions: Fall Required Braces or  Orthoses: Other Brace Other Brace: prevalon - encouraged to wear when sleeping or when taking naps Restrictions Weight Bearing Restrictions: No      Mobility Bed Mobility               General bed mobility comments: modified independent    Transfers Overall transfer level: Needs assistance   Transfers: Sit to/from Stand, Bed to chair/wheelchair/BSC Sit to Stand: Supervision Stand pivot transfers: Min guard                Balance Overall balance assessment: Needs assistance   Sitting balance-[name]: Good       Standing balance-[name]: Poor                             ADL either performed or assessed with clinical judgement   ADL Overall ADL's : Needs assistance/impaired                                     Functional mobility during ADLs: Min guard;Rolling walker (2 wheels) General ADL Comments: Close to baseline reagrding self care; has difficulty donning socks however has AE adn her husband assists as needed; Discussed fall risk and recommendation for fall alert system     Vision         Perception     Praxis      Pertinent Vitals/Pain       Hand Dominance Right   Extremity/Trunk Assessment Upper Extremity Assessment Upper Extremity Assessment: Overall WFL for tasks assessed (B shoulder arthritis per pt)  Cervical / Trunk Assessment Cervical / Trunk Assessment: Other exceptions   Communication Communication Communication: No difficulties   Cognition Arousal/Alertness: Awake/alert Behavior During Therapy: WFL for tasks assessed/performed Overall Cognitive Status: Within Functional Limits for tasks assessed                                       General Comments       Exercises     Shoulder Instructions      Home Living Family/patient expects to be discharged to:: Private residence Living Arrangements: Spouse/significant other Available Help at Discharge:  Family;Available 24 hours/day Type of Home: House Home Access: Ramped entrance     Home Layout: Two level;Able to live on main level with bedroom/bathroom Alternate Level Stairs-Number of Steps: flight Alternate Level Stairs-Rails: Left Bathroom Shower/Tub: Tub/shower unit   Bathroom Toilet: Handicapped height Bathroom Accessibility: Yes How Accessible: Accessible via walker Home Equipment: Tub bench;BSC/3in1;Rolling Walker (2 wheels);Rollator (4 wheels);Wheelchair - Scientist, physiological: Reacher;Sock aid;Other (Comment) (toilet aide) Additional Comments: Uses BSC and places it near the toilet.      Prior Functioning/Environment Prior Level of Function : Needs assist             Mobility Comments: patient states she performs stand pivot transfer to [facility] when husband not present. States she has not been as mobilite lately. At baseline "hops @ 10 ft" "I've been in my bedroom for 3 weeks". Husband helps as needed; he does her dressing changes ADLs Comments: Pt able to transfer onto tub bench with supervision. Needs assist to put sock on        OT Problem List: Impaired balance (sitting and/or standing);Decreased strength;Obesity;Pain      OT Treatment/Interventions: [name]/ADL training;Therapeutic exercise;DME and/or AE instruction;Therapeutic activities;Patient/family education;Balance training    OT Goals(Current goals can be found in the care plan section) Acute Rehab OT Goals Patient Stated Goal: to not have her leg amputated OT Goal Formulation: With patient Time For Goal Achievement: 07/03/22 Potential to Achieve Goals: Good  OT Frequency: Min 2X/week    Co-evaluation PT/OT/SLP Co-Evaluation/Treatment: Yes Reason for Co-Treatment: For patient/therapist safety          AM-PAC OT "6 Clicks" Daily Activity     Outcome Measure Help from another person eating meals?: None Help from another person taking care of personal grooming?: A  Little Help from another person toileting, which includes using toliet, bedpan, or urinal?: A Little Help from another person bathing (including washing, rinsing, drying)?: A Little Help from another person to put on and taking off regular upper body clothing?: A Little Help from another person to put on and taking off regular lower body clothing?: A Little 6 Click Score: 19   End of Session Equipment Utilized During Treatment: Rolling walker (2 wheels) Nurse Communication: Mobility status  Activity Tolerance: Patient tolerated treatment well Patient left: in chair;with call bell/phone within reach  OT Visit Diagnosis: Unsteadiness on feet (R26.81);Pain Pain - Right/Left: Left Pain - part of body: Leg                Time: 1419-1450 OT Time Calculation (min): 31 min Charges:  OT General Charges $OT Visit: 1 Visit OT Evaluation $OT Eval Moderate Complexity: [place], OT/L   Acute OT Clinical Specialist [place] Pager [phone] Office [phone]   [facility] 06/19/2022, 3:13 PM

## 2022-06-19 NOTE — Progress Notes (Signed)
PROGRESS NOTE  [name]  MVH:[id number] DOB: [birth year] DOA: 06/14/2022 PCP: Shelda Pal, DO   Brief Narrative: Patient is a 61 year old female with history of morbid obesity, diabetes type 2, right AKA, irritable bowel syndrome, hypertension, hyperlipidemia, hypothyroidism, rheumatoid arthritis, asthma who was admitted for the concern of left foot diabetic wound infection, abnormal labs.  She was being monitored for left foot infection for last 4 weeks, lab work done as an outpatient showed leukocytosis, elevated ESR, patient was also having severe left foot pain, foul smell so she was advised to go to the emergency department.  She has been started antibiotics for antibiotics. Orthopedics consulted.MRI suggestive of cellulitis, but no evidence  for osteomyelitis.  Orthopedics thinks that the ulcer is extending down to bone and recommending amputation.  Patient is hesitant, cannot make decision for amputation and wants to try antibiotics and follow as an outpatient.ID consulted today for assistance with antibiotics.   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)    Left foot diabetic wound infection: Concern for osteomyelitis initially .  Presented with fall as well, left foot pain, elevated ESR.On  broad spectrum antibiotics.  MRI suggestive of cellulitis, low suspicion  for osteomyelitis.  Orthopedics thinks that the ulcer is extending down to bone and recommending amputation.  Patient is hesitant, cannot make decision for amputation and wants to try antibiotics and follow as an outpatient.ID consulted today for assistance with antibiotics. ID consulted   Gram-negative bacteremia: One of the blood cultures/anaerobic bottle sent on 10/4 showed Bacteriodes fragilis.  Continue current antibiotics for now.   Chance  of contaminant,repeat cultures sent,NGTD. ID following  Acute on chronic blood loss anemia: In the setting of chronic left foot wound.  Has evidence of iron deficiency anemia as per iron studies done on 12/22/2021.  Baseline hemoglobin around 11.  Currently hemoglobin in the range of 8-9.  Started on iron supplementation.  Continue to monitor CBC.  Denies any change in the color of the stool  Diabetes type 2: Recent A1c of 9.2.  Remains hyperglycemic.  Diabetic coordinator consulted.  Takes concentrated insulin at home.  Currently on sliding scale and long acting  Hypokalemia/hypomagnesemia: Continue monitoring and supplementation  Severe morbid obesity: BMI of 56  Hypothyroidism: TSH of 9.4.  Free T4 normal.Takes levothyroxine at maximum dose.  We recommend to follow-up in 4 to 6 weeks.  Hyperlipidemia: On ezitimibe  Itchy bilateral upper extremity rash: Continue oral Benadryl, triamcinolone cream  Debility/deconditioning: Patient is status post right AKA.PT/OT consulted        DVT prophylaxis:Lovenox     Code Status: Full Code  Family Communication: Husband at bedside  Patient status:Inpatient  Patient is from :Home  Anticipated discharge WU:XLKG  Estimated DC date: Waiting for ID recommendation for antibiotics before discharge   Consultants: Ortho  Procedures:None yet  Antimicrobials:  Anti-infectives (From admission, onward)    Start     Dose/Rate Route Frequency Ordered Stop   06/18/22 0045  metroNIDAZOLE (FLAGYL) IVPB 500 mg       See Hyperspace for full Linked Orders Report.   500 mg 100 mL/hr over 60 Minutes Intravenous Every 12 hours 06/17/22 1613     06/17/22 1700  cefTRIAXone (ROCEPHIN) 2 g in sodium chloride 0.9 % 100 mL IVPB  Status:  Discontinued       See  Hyperspace for full Linked Orders Report.   2 g 200 mL/hr over 30 Minutes Intravenous Every 24 hours 06/17/22 0402 06/17/22 1613   06/17/22 1700  cefTRIAXone (ROCEPHIN) 2 g in sodium  chloride 0.9 % 100 mL IVPB       See Hyperspace for full Linked Orders Report.   2 g 200 mL/hr over 30 Minutes Intravenous Every 24 hours 06/17/22 1613 06/21/22 1659   06/17/22 0415  metroNIDAZOLE (FLAGYL) IVPB 500 mg  Status:  Discontinued       See Hyperspace for full Linked Orders Report.   500 mg 100 mL/hr over 60 Minutes Intravenous Every 8 hours 06/17/22 0402 06/17/22 1613   06/15/22 2200  vancomycin (VANCOREADY) IVPB 1750 mg/350 mL        1,750 mg 175 mL/hr over 120 Minutes Intravenous Every 24 hours 06/14/22 2155     06/14/22 2300  vancomycin (VANCOCIN) IVPB 1000 mg/200 mL premix       See Hyperspace for full Linked Orders Report.   1,000 mg 200 mL/hr over 60 Minutes Intravenous  Once 06/14/22 2147 06/14/22 2348   06/14/22 2200  vancomycin (VANCOCIN) IVPB 1000 mg/200 mL premix       See Hyperspace for full Linked Orders Report.   1,000 mg 200 mL/hr over 60 Minutes Intravenous  Once 06/14/22 2147 06/14/22 2348   06/14/22 2130  vancomycin (VANCOCIN) 2,500 mg in sodium chloride 0.9 % 500 mL IVPB  Status:  Discontinued        2,500 mg 262.5 mL/hr over 120 Minutes Intravenous  Once 06/14/22 2129 06/14/22 2147   06/14/22 1700  cefTRIAXone (ROCEPHIN) 2 g in sodium chloride 0.9 % 100 mL IVPB  Status:  Discontinued       See Hyperspace for full Linked Orders Report.   2 g 200 mL/hr over 30 Minutes Intravenous Every 24 hours 06/14/22 1647 06/17/22 0402   06/14/22 1700  metroNIDAZOLE (FLAGYL) IVPB 500 mg  Status:  Discontinued       See Hyperspace for full Linked Orders Report.   500 mg 100 mL/hr over 60 Minutes Intravenous Every 12 hours 06/14/22 1647 06/17/22 0402       Subjective: Patient seen and examined at bedside today.  Husband at bedside too.  Very frustrated due to Dr Jess Barters  recommendation for amputation.  She feels that she not ready for amputation and wants to try antibiotics.  She wants to go home with antibiotics.  Objective: Vitals:   06/18/22 2031 06/18/22 2209  06/19/22 0447 06/19/22 0815  BP: 127/72  136/66   Pulse:   80   Resp: 17  17   Temp: 98 F (36.7 C)  97.9 F (36.6 C)   TempSrc: Oral  Axillary   SpO2: 95% 96% 95% 98%  Weight:      Height:       No intake or output data in the 24 hours ending 06/19/22 1315  Filed Weights   06/14/22 1622 06/14/22 1623  Weight: 136.1 kg (!) 140.2 kg    Examination:   General exam: Overall comfortable, not in distress,obese HEENT: PERRL Respiratory system:  no wheezes or crackles  Cardiovascular system: S1 & S2 heard, RRR.  Gastrointestinal system: Abdomen is nondistended, soft and nontender. Central nervous system: Alert and oriented Extremities: Right BKA,  left foot ulcer on the plantar surface covered with dressing Skin: Itchy erythematous rash on bilateral lower extremities, scattered skin breakdowns, venous stasis changes on right lower extremity      Data  Reviewed: I have personally reviewed following labs and imaging studies  CBC: Recent Labs  Lab 06/14/22 0947 06/15/22 0212 06/16/22 0254 06/17/22 0217 06/19/22 0226  WBC 11.3* 11.1* 11.7* 10.6* 10.1  HGB 9.3* 8.9* 8.8* 8.9* 9.6*  HCT 32.2* 32.8* 32.2* 32.7* 35.8*  MCV 63.7 aL* 68.2* 67.9* 67.7* 68.3*  PLT 335.0 302 322 343 383   Basic Metabolic Panel: Recent Labs  Lab 06/14/22 1705 06/15/22 0212 06/16/22 0254 06/17/22 0217 06/19/22 0226  NA 139  --  136 133* 139  K 3.1* 3.1* 3.2* 3.6 4.1  CL 101  --  97* 96* 98  CO2 28  --  _0 GLUCOSE 124*  --  232* 329* 214*  BUN 11  --  _1 CREATININE 0.77 0.71 0.75 0.84 0.70  CALCIUM 8.7*  --  8.8* 8.4* 9.5  MG  --  1.6* 1.8  --   --      Recent Results (from the past 240 hour(s))  Blood culture (routine x 2)     Status: None (Preliminary result)   Collection Time: 06/14/22  4:58 PM   Specimen: BLOOD  Result Value Ref Range Status   Specimen Description   Final    BLOOD LEFT ANTECUBITAL Performed at [facility], [address] [place], [place] [address]    Special Requests   Final    BOTTLES DRAWN AEROBIC AND ANAEROBIC Blood Culture adequate volume Performed at [facility], [address] [place], [place] [address]    Culture   Final    NO GROWTH 4 DAYS Performed at [facility], [place] [address] [place], [place] [address]    Report Status PENDING  Incomplete  Blood culture (routine x 2)     Status: Abnormal   Collection Time: 06/14/22  5:02 PM   Specimen: BLOOD  Result Value Ref Range Status   Specimen Description   Final    BLOOD RIGHT ANTECUBITAL Performed at [facility], [address] [place], [place] [address]    Special Requests   Final    BOTTLES DRAWN AEROBIC AND ANAEROBIC Blood Culture results may not be optimal due to an excessive volume of blood received in culture bottles Performed at [facility], [address] [place], [place] [address]    Culture  Setup Time   Final    ANAEROBIC BOTTLE ONLY GRAM NEGATIVE RODS CRITICAL RESULT CALLED TO, READ BACK BY AND VERIFIED WITH:  C/ PHARMD JAMES L. 06/17/22 0355 A. LAFRANCE    Culture (A)  Final    BACTEROIDES FRAGILIS BETA LACTAMASE POSITIVE Performed at [facility], [place] [address], [place],  [address]    Report Status 06/19/2022 FINAL  Final  Blood Culture ID Panel (Reflexed)     Status: Abnormal   Collection Time: 06/14/22  5:02 PM  Result Value Ref Range Status   Enterococcus faecalis NOT DETECTED NOT DETECTED Final   Enterococcus Faecium NOT DETECTED NOT DETECTED Final   Listeria monocytogenes NOT DETECTED NOT DETECTED Final   Staphylococcus species NOT DETECTED NOT DETECTED Final   Staphylococcus aureus (BCID) NOT DETECTED NOT DETECTED Final   Staphylococcus epidermidis NOT DETECTED NOT DETECTED Final   Staphylococcus lugdunensis NOT DETECTED NOT DETECTED Final   Streptococcus species NOT DETECTED NOT DETECTED Final   Streptococcus agalactiae NOT DETECTED NOT DETECTED Final    Streptococcus pneumoniae NOT DETECTED NOT DETECTED Final  Streptococcus pyogenes NOT DETECTED NOT DETECTED Final   A.calcoaceticus-baumannii NOT DETECTED NOT DETECTED Final   Bacteroides fragilis DETECTED (A) NOT DETECTED Final    Comment: CRITICAL RESULT CALLED TO, READ BACK BY AND VERIFIED WITH:  C/ PHARMD JAMES L. 06/17/22 0355 A. LAFRANCE    Enterobacterales NOT DETECTED NOT DETECTED Final   Enterobacter cloacae complex NOT DETECTED NOT DETECTED Final   Escherichia coli NOT DETECTED NOT DETECTED Final   Klebsiella aerogenes NOT DETECTED NOT DETECTED Final   Klebsiella oxytoca NOT DETECTED NOT DETECTED Final   Klebsiella pneumoniae NOT DETECTED NOT DETECTED Final   Proteus species NOT DETECTED NOT DETECTED Final   Salmonella species NOT DETECTED NOT DETECTED Final   Serratia marcescens NOT DETECTED NOT DETECTED Final   Haemophilus influenzae NOT DETECTED NOT DETECTED Final   Neisseria meningitidis NOT DETECTED NOT DETECTED Final   Pseudomonas aeruginosa NOT DETECTED NOT DETECTED Final   Stenotrophomonas maltophilia NOT DETECTED NOT DETECTED Final   Candida albicans NOT DETECTED NOT DETECTED Final   Candida auris NOT DETECTED NOT DETECTED Final   Candida glabrata NOT DETECTED NOT DETECTED Final   Candida krusei NOT DETECTED NOT DETECTED Final   Candida parapsilosis NOT DETECTED NOT DETECTED Final   Candida tropicalis NOT DETECTED NOT DETECTED Final   Cryptococcus neoformans/gattii NOT DETECTED NOT DETECTED Final    Comment: Performed at [facility], [place] [address] [place], [place] [address]  MRSA Next Gen by PCR, Nasal     Status: Abnormal   Collection Time: 06/15/22  4:44 AM   Specimen: Nasal Mucosa; Nasal Swab  Result Value Ref Range Status   MRSA by PCR Next Gen DETECTED (A) NOT DETECTED Final    Comment: CRITICAL RESULT CALLED TO, READ BACK BY AND VERIFIED WITH:  C/ ABRAHAM O., RN 06/15/22 9476 A. LAFRANCE (NOTE) The GeneXpert MRSA Assay (FDA approved for NASAL  specimens only), is one component of a comprehensive MRSA colonization surveillance program. It is not intended to diagnose MRSA infection nor to guide or monitor treatment for MRSA infections. Test performance is not FDA approved in patients less than 61 years old. Performed at [facility], [place] [address], [place], [place] [address]   Culture, blood (Routine X 2) w Reflex to ID Panel     Status: None (Preliminary result)   Collection Time: 06/17/22 12:20 PM   Specimen: BLOOD  Result Value Ref Range Status   Specimen Description BLOOD LEFT ANTECUBITAL  Final   Special Requests   Final    BOTTLES DRAWN AEROBIC AND ANAEROBIC Blood Culture adequate volume   Culture   Final    NO GROWTH < 24 HOURS Performed at [facility], [place] [address] [place], [place] [address]    Report Status PENDING  Incomplete  Culture, blood (Routine X 2) w Reflex to ID Panel     Status: None (Preliminary result)   Collection Time: 06/17/22 12:27 PM   Specimen: BLOOD  Result Value Ref Range Status   Specimen Description BLOOD BLOOD LEFT HAND  Final   Special Requests   Final    BOTTLES DRAWN AEROBIC AND ANAEROBIC Blood Culture adequate volume   Culture   Final    NO GROWTH < 24 HOURS Performed at [facility], [place] [address], [place], [place] [address]    Report Status PENDING  Incomplete     Radiology Studies: No results found.  Scheduled Meds:  Chlorhexidine Gluconate Cloth  6 each Topical Q0600  enoxaparin (LOVENOX) injection  70 mg Subcutaneous Q24H   ezetimibe  10 mg Oral Daily   ferrous gluconate  324 mg Oral Q breakfast   furosemide  40 mg Oral Daily   gabapentin  400 mg Oral BID   gabapentin  800 mg Oral QHS    HYDROmorphone (DILAUDID) injection  0.5 mg Intravenous Once   insulin aspart  0-20 Units Subcutaneous TID WC   insulin aspart  0-5 Units Subcutaneous QHS   insulin aspart  7 Units Subcutaneous TID WC   [START ON 06/20/2022] insulin glargine-yfgn  45 Units  Subcutaneous Daily   levothyroxine  200 mcg Oral QAC breakfast   lidocaine  1 patch Transdermal Q24H   LORazepam  1 mg Oral Once   mometasone-formoterol  2 puff Inhalation BID   mupirocin ointment  1 Application Nasal BID   pantoprazole  40 mg Oral Daily   pregabalin  50 mg Oral TID   saccharomyces boulardii  250 mg Oral TID   Continuous Infusions:  sodium chloride Stopped (06/14/22 1950)   cefTRIAXone (ROCEPHIN)  IV 2 g (06/18/22 1639)   And   metronidazole 500 mg (06/19/22 1221)   vancomycin 1,750 mg (06/18/22 2217)     LOS: 4 days   Shelly Coss, MD [facility] P10/05/2022, 1:15 PM

## 2022-06-19 NOTE — Consult Note (Signed)
[facility] Nurse Consult Note: Reason for Consult:left lateral foot full thickness ulceration. WOC is simultaneously consulted with Orthopedics and Ortho PA-C Silvestre Gunner has seen patient. Awaiting MRI for definitive POC. Nursing is requesting guidance for wound care. Patient with right AKA. Wound type:neuropathic Pressure Injury POA: Yes/No/NA Measurement:TO be obtained by Bedside RN and placed on Nursing Flow Sheet with next dressing change. Wound bed:See photo taken on 06/14/22. Nursing describes as 60% yellow and 40% black Drainage (amount, consistency, odor) Scant serosanguinous Periwound:Macerated Dressing procedure/placement/frequency: I will provide conservative for topical care using a twice daily soap and water cleanse, rinse and dry followed by painting the lesion with a povidone-iodine swabstick and allowing it to air dry. Once dry, wound is to be covered with dry gauze and secured with Kerlix roll gauze applied from just below toes to just below knee. The Kerlix is to be topped with and ACE bandage applied in a similar manner. Foot is to be placed in a pressure redistribution heel boot.  Any orders I provide are to be superceded by those from orthopedics.  A sacral foam is to be placed for prevention of PI in this area given the altered weight distribution and potential for skin injury.  [place] nursing team will not follow, but will remain available to this patient, the nursing and medical teams.  Please re-consult if needed.  Thank you for inviting [place] to participate in this patient's Plan of Care.  Maudie Flakes, MSN, RN, CNS, [place], Serita Grammes, [employer], [employer] phone:  [phone]

## 2022-06-19 NOTE — Inpatient Diabetes Management (Signed)
Inpatient Diabetes Program Recommendations  AACE/ADA: New Consensus Statement on Inpatient Glycemic Control (2015)  Target Ranges:  Prepandial:   less than 140 mg/dL      Peak postprandial:   less than 180 mg/dL (1-2 hours)      Critically ill patients:  140 - 180 mg/dL   Lab Results  Component Value Date   GLUCAP 238 (H) 06/19/2022   HGBA1C 9.2 (H) 05/26/2022    Review of Glycemic Control  Diabetes history: type 2 Outpatient Diabetes medications: U-500 insulin 10-25 units once or twice per day, Farxiga 10 mg daily, Metformin 1000 mg BID Current orders for Inpatient glycemic control: Semglee 40 units daily, Novolog RESISTANT correction scale TID & Novolog 0-5 units HS scale, Novolog 7 units TID  Inpatient Diabetes Program Recommendations:   Spoke with patient on 10/6 about her U-500 insulin. See my note in progress notes from 10/6. Usually takes U-500 insulin once a day per patient.   Recommend increasing Semglee to 45 units daily while in the hospital. Patient has been regulating her U-500 insulin dosages at home. Has PCP to follow her diabetes.   Harvel Ricks RN BSN CDE Diabetes Coordinator Pager: [phone]  8am-5pm

## 2022-06-20 ENCOUNTER — Encounter: Payer: Commercial Managed Care - HMO | Admitting: Registered Nurse

## 2022-06-20 ENCOUNTER — Other Ambulatory Visit (HOSPITAL_COMMUNITY): Payer: Self-pay

## 2022-06-20 DIAGNOSIS — L089 Local infection of the skin and subcutaneous tissue, unspecified: Secondary | ICD-10-CM | POA: Diagnosis not present

## 2022-06-20 DIAGNOSIS — E11628 Type 2 diabetes mellitus with other skin complications: Secondary | ICD-10-CM | POA: Diagnosis not present

## 2022-06-20 LAB — GLUCOSE, CAPILLARY
Glucose-Capillary: 226 mg/dL — ABNORMAL HIGH (ref 70–99)
Glucose-Capillary: 308 mg/dL — ABNORMAL HIGH (ref 70–99)

## 2022-06-20 MED ORDER — FERROUS GLUCONATE 324 (38 FE) MG PO TABS: 324.0000 mg | ORAL_TABLET | Freq: Every day | ORAL | Status: DC

## 2022-06-20 MED ORDER — AMOXICILLIN-POT CLAVULANATE 875-125 MG PO TABS
1.0000 | ORAL_TABLET | Freq: Two times a day (BID) | ORAL | 0 refills | Status: DC
  Filled 2022-06-20: qty 60, 30d supply, fill #0

## 2022-06-20 MED ORDER — TRIAMCINOLONE ACETONIDE 0.1 % EX CREA
1.0000 | TOPICAL_CREAM | Freq: Three times a day (TID) | CUTANEOUS | 0 refills | Status: AC | PRN
  Filled 2022-06-20: qty 30, 14d supply, fill #0

## 2022-06-20 MED ORDER — DOXYCYCLINE HYCLATE 100 MG PO TABS
100.0000 mg | ORAL_TABLET | Freq: Two times a day (BID) | ORAL | 0 refills | Status: DC
  Filled 2022-06-20: qty 60, 30d supply, fill #0

## 2022-06-20 MED ORDER — AMOXICILLIN-POT CLAVULANATE 875-125 MG PO TABS
1.0000 | ORAL_TABLET | Freq: Two times a day (BID) | ORAL | Status: DC
  Administered 2022-06-20: 1 via ORAL
  Filled 2022-06-20: qty 1

## 2022-06-20 MED ORDER — FERROUS GLUCONATE 324 (38 FE) MG PO TABS
324.0000 mg | ORAL_TABLET | Freq: Every day | ORAL | 3 refills | Status: DC
  Filled 2022-06-20: qty 30, 30d supply, fill #0

## 2022-06-20 MED ORDER — DOXYCYCLINE HYCLATE 100 MG PO TABS
100.0000 mg | ORAL_TABLET | Freq: Two times a day (BID) | ORAL | Status: DC
  Administered 2022-06-20: 100 mg via ORAL
  Filled 2022-06-20: qty 1

## 2022-06-20 NOTE — Progress Notes (Signed)
Discharge instructions reviewed with pt and her husband. Questions answered.  Copy of instructions given to pt.  Scripts filled by [facility] and delivered to pt's room.  Pt to be d/c'd via wheelchair with belongings, with  husband.           Will be escorted by staff.

## 2022-06-20 NOTE — Discharge Summary (Signed)
Physician Discharge Summary  Remington Skalsky [place] HDQ:[id number] DOB: 10-11-1960 DOA: 06/14/2022  PCP: Shelda Pal, DO  Admit date: 06/14/2022 Discharge date: 06/20/2022  Admitted From: Home Disposition:  Home  Discharge Condition:Stable CODE STATUS:FULL Diet recommendation: Carb Modified   Brief/Interim Summary:  Patient is a 61 year old female with history of morbid obesity, diabetes type 2, right AKA, irritable bowel syndrome, hypertension, hyperlipidemia, hypothyroidism, rheumatoid arthritis, asthma who was admitted for the concern of left foot diabetic wound infection, abnormal labs.  She was being monitored for left foot infection for last 4 weeks, lab work done as an outpatient showed leukocytosis, elevated ESR, patient was also having severe left foot pain, foul smell so she was advised to go to the emergency department.  She has been started antibiotics for antibiotics. Orthopedics consulted.MRI suggestive of cellulitis, but no evidence  for osteomyelitis.  Orthopedics thinks that the ulcer is extending down to bone and recommending amputation.  Patient is hesitant, cannot make decision for amputation and wants to try antibiotics and follow as an outpatient.ID consulted  for assistance with antibiotics.  She will be discharged on 1 month course of Augmentin, doxycycline and follow-up with Dr. Sharol Given for further decision.  Medically stable for discharge home today.  Following problems were addressed during her hospitalization:  Left foot diabetic wound infection: Concern for osteomyelitis initially .  Presented with fall as well, left foot pain, elevated ESR.On  broad spectrum antibiotics.  MRI suggestive of cellulitis, low suspicion  for osteomyelitis.  Orthopedics thinks that the ulcer is extending down to bone and recommending amputation.  Patient is hesitant, cannot make decision for amputation and wants to try antibiotics and follow as an outpatient.ID consulted for assistance with  antibiotics.  She will follow-up with Dr. Today in a week.   Gram-negative bacteremia: One of the blood cultures/anaerobic bottle sent on 10/4 showed Bacteriodes fragilis.  Repeat cultures sent,NGTD. ID was following   Acute on chronic blood loss anemia: In the setting of chronic left foot wound.  Has evidence of iron deficiency anemia as per iron studies done on 12/22/2021.  Baseline hemoglobin around 11.  Currently hemoglobin in the range of 8-9.  Started on iron supplementation.  Continue to monitor CBC.  Denies any change in the color of the stool   Diabetes type 2: Recent A1c of 9.2.  Remains hyperglycemic.  Diabetic coordinator consulted.  Takes concentrated insulin at home.     Severe morbid obesity: BMI of 56   Hypothyroidism: TSH of 9.4.  Free T4 normal.Takes levothyroxine at maximum dose.  We recommend to follow-up in 4 to 6 weeks.   Hyperlipidemia: On ezitimibe   Itchy bilateral upper extremity rash: Continue triamcinolone cream   Debility/deconditioning: Patient is status post right AKA.PT/OT consulted, recommended home health on discharge.        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 Sharol Given as  soon as possible   Discharge wound care:   Complete by: As directed    As per wound care nurse   Increase activity slowly   Complete by: As directed  Allergies as of 06/20/2022       Reactions   Fish Allergy Anaphylaxis   INCLUDES OMEGA 3 OILS   Fish Oil Anaphylaxis, Swelling   THROAT SWELLS INCLUDES FISH AS A CLASS   Omega-3 Fatty Acids Swelling   Throat  swelling  Has tolerated Glucerna nutritional drinks   Crestor [rosuvastatin] Other (See Comments)   Extreme joint pain, to this & other statins   Gabapentin Anxiety   Anxiety on high doses   Lovastatin Other (See Comments)   EXTREME JOINT PAIN   Metronidazole Nausea Only, Swelling   Headache and shakes Nausea and vomiting Pt tolerating IV 06/14/22   Red Yeast Rice [cholestin] Other (See Comments)   Muscle pain and severe joint pain.    Rotigotine Swelling   (Neupro) Extreme edema   Atrovent Hfa [ipratropium Bromide Hfa] Other (See Comments)   wheezing   Iodine Swelling   Other Other (See Comments)   Surgical Staples causes redness/infection per patient.   Pepto-bismol [bismuth Subsalicylate] Nausea And Vomiting   Extreme vomiting   Victoza [liraglutide]    Unsure of reaction    Enalapril Cough   Suprep [na Sulfate-k Sulfate-mg Sulf] Nausea And Vomiting   Tape Other (See Comments)   Electrodes causes skin breakdown        Medication List     STOP taking these medications    triamcinolone cream 0.1 % Commonly known as: KENALOG       TAKE these medications    acetaminophen 500 MG tablet Commonly known as: TYLENOL Take 500 mg by mouth every 8 (eight) hours as needed for mild pain.   albuterol (2.5 MG/3ML) 0.083% nebulizer solution Commonly known as: PROVENTIL USE 1 VIAL IN NEBULIZER EVERY 4 HOURS AS NEEDED FOR WHEEZING FOR SHORTNESS OF BREATH What changed: See the new instructions.   Ventolin HFA 108 (90 Base) MCG/ACT inhaler Generic drug: albuterol INHALE 2 PUFFS BY MOUTH EVERY 4 HOURS AS NEEDED FOR WHEEZING AND  FOR  SHORTNESS  OF  BREATH What changed: See the new instructions.   amoxicillin-clavulanate 875-125 MG tablet Commonly known as: AUGMENTIN Take 1 tablet by mouth every 12 (twelve) hours.   ascorbic acid 500 MG tablet Commonly known as: VITAMIN C Take 1 tablet (500 mg total) by mouth 2 (two) times daily. What changed: when to take this    aspirin EC 81 MG tablet Take 1 tablet (81 mg total) by mouth every evening.   augmented betamethasone dipropionate 0.05 % cream Commonly known as: Diprolene AF Apply topically 2 (two) times daily. What changed:  how much to take when to take this reasons to take this   cyclobenzaprine 10 MG tablet Commonly known as: FLEXERIL Take 10 mg by mouth 3 (three) times daily as needed for muscle spasms.   dapagliflozin propanediol 10 MG Tabs tablet Commonly known as: Farxiga Take 1 tablet (10 mg total) by mouth daily before breakfast.   diclofenac Sodium 1 % Gel Commonly known as: VOLTAREN Apply 2 g topically 4 (four) times daily.   Dilt-XR 240 MG 24 hr capsule Generic drug: diltiazem Take 1 capsule (240 mg total) by mouth daily. What changed: when to take this   doxycycline 100 MG tablet Commonly known as: VIBRA-TABS Take 1 tablet (100 mg total) by mouth every 12 (twelve) hours. What changed: when to take this   EPINEPHrine 0.3 mg/0.3 mL Soaj injection Commonly known as: EpiPen 2-Pak USE AS DIRECTED FOR SEVERE ALLERGIC REACTION.   ezetimibe 10 MG tablet Commonly  known as: ZETIA Take 1 tablet (10 mg total) by mouth daily.   ferrous gluconate 324 MG tablet Commonly known as: FERGON Take 1 tablet (324 mg total) by mouth daily with breakfast. Start taking on: June 21, 2022   fluticasone 50 MCG/ACT nasal spray Commonly known as: FLONASE USE 2 SPRAY(S) IN EACH NOSTRIL ONCE DAILY AS NEEDED FOR ALLERGIES OR  RHINITIS What changed: See the new instructions.   furosemide 20 MG tablet Commonly known as: LASIX Take 2 tablets by mouth once daily   gabapentin 400 MG capsule Commonly known as: NEURONTIN TAKE 1 CAPSULE BY MOUTH IN THE MORNING AND 1 AT NOON AND 2 AT BEDTIME What changed: See the new instructions.   GLUCOMANNAN PO Take 1,995 mg by mouth daily.   glucose blood test strip 1 each by Other route 4 (four) times daily. Contour Next strips   guaiFENesin 600 MG  12 hr tablet Commonly known as: MUCINEX Take 600 mg by mouth 2 (two) times daily as needed for cough or to loosen phlegm.   HUMULIN R 500 UNIT/ML injection Generic drug: insulin regular human CONCENTRATED INJECT 0.08-0.3 MLS (40-150 UNITS TOTAL) INTO THE SKIN THREE TIMES DAILY WITH MEALS What changed:  how much to take how to take this when to take this additional instructions   Hydrocortisone Max St 1 % Generic drug: hydrocortisone cream Apply 1 application topically 3 (three) times daily as needed for itching (minor skin irritation).   hydroxychloroquine 200 MG tablet Commonly known as: PLAQUENIL Take $RemoveBefore'200mg'JiDTmIjcyldPO$  by mouth twice daily Monday through Friday only. None on Saturday or Sunday.   INSULIN SYRINGE 1CC/29G 29G X 1/2" 1 ML Misc Commonly known as: B-D INSULIN SYRINGE Use three times daily with insulin.  DX E11.9   isosorbide mononitrate 30 MG 24 hr tablet Commonly known as: IMDUR Take 1 tablet by mouth once daily What changed: when to take this   levocetirizine 5 MG tablet Commonly known as: XYZAL TAKE 1 TABLET BY MOUTH ONCE DAILY IN THE EVENING   levothyroxine 200 MCG tablet Commonly known as: SYNTHROID TAKE 1 TABLET BY MOUTH ONCE DAILY BEFORE BREAKFAST   metFORMIN 500 MG 24 hr tablet Commonly known as: GLUCOPHAGE-XR Take 2 tablets by mouth twice daily What changed: when to take this   mometasone-formoterol 100-5 MCG/ACT Aero Commonly known as: DULERA Inhale 2 puffs into the lungs 2 (two) times daily as needed for wheezing or shortness of breath.   mupirocin ointment 2 % Commonly known as: BACTROBAN Apply 1 application topically 2 (two) times daily as needed (wound care).   naloxone 4 MG/0.1ML Liqd nasal spray kit Commonly known as: NARCAN Place 1 spray into the nose as needed (opoid overdose).   nitroGLYCERIN 0.4 MG SL tablet Commonly known as: NITROSTAT DISSOLVE ONE TABLET UNDER THE TONGUE EVERY 5 MINUTES AS NEEDED FOR CHEST PAIN.  DO NOT EXCEED A TOTAL  OF 3 DOSES IN 15 MINUTES What changed: See the new instructions.   nystatin powder Commonly known as: MYCOSTATIN/NYSTOP Apply 1 Application topically 3 (three) times daily. RLE What changed:  when to take this reasons to take this   omeprazole 40 MG capsule Commonly known as: PRILOSEC Take 1 capsule by mouth once daily What changed: when to take this   Oxycodone HCl 20 MG Tabs Take 1 tablet (20 mg total) by mouth every 8 (eight) hours as needed. What changed: reasons to take this   oxymetazoline 0.05 % nasal spray Commonly known as: AFRIN Place 2 sprays into  both nostrils 2 (two) times daily as needed for congestion.   pramipexole 1 MG tablet Commonly known as: MIRAPEX TAKE 1 TABLET BY MOUTH THREE TIMES DAILY AS NEEDED AND 1/2 (ONE-HALF) TABLET TWICE DAILY AS NEEDED FOR  BREAKTHROUGH  RESTLESS  LEG What changed: See the new instructions.   pregabalin 50 MG capsule Commonly known as: Lyrica Take 1 capsule (50 mg total) by mouth 3 (three) times daily.   promethazine 25 MG tablet Commonly known as: PHENERGAN Take 1 tablet (25 mg total) by mouth every 8 (eight) hours as needed for vomiting or nausea.   saccharomyces boulardii 250 MG capsule Commonly known as: FLORASTOR Take 250 mg by mouth 3 (three) times daily.   Systane 0.4-0.3 % Soln Generic drug: Polyethyl Glycol-Propyl Glycol Place 1-2 drops into both eyes in the morning.   triamcinolone 0.1 % cream : eucerin Crea Apply 1 Application topically 3 (three) times daily as needed for up to 14 days for itching.   Vitamin D (Ergocalciferol) 1.25 MG (50000 UNIT) Caps capsule Commonly known as: DRISDOL TAKE 1 CAPSULE BY MOUTH ONCE A WEEK **TAKE  ON  FRIDAY** What changed:  how much to take how to take this when to take this additional instructions   WOMENS 50+ MULTI VITAMIN PO Take 1 tablet by mouth daily.               Discharge Care Instructions  (From admission, onward)           Start     Ordered    06/20/22 0000  Discharge wound care:       Comments: As per wound care nurse   06/20/22 1104            Follow-up Information     Newt Minion, MD Follow up in 1 week(s).   Specialty: Orthopedic Surgery Contact information: [place] [address] [phone]         [facility], [facility] Follow up.   Specialty: Home Health Services Contact information: [address] [place] [address] [phone]                Allergies  Allergen Reactions   Fish Allergy Anaphylaxis    INCLUDES OMEGA 3 OILS   Fish Oil Anaphylaxis and Swelling    THROAT SWELLS INCLUDES FISH AS A CLASS   Omega-3 Fatty Acids Swelling    Throat swelling  Has tolerated Glucerna nutritional drinks    Crestor [Rosuvastatin] Other (See Comments)    Extreme joint pain, to this & other statins   Gabapentin Anxiety    Anxiety on high doses   Lovastatin Other (See Comments)    EXTREME JOINT PAIN   Metronidazole Nausea Only and Swelling    Headache and shakes Nausea and vomiting Pt tolerating IV 06/14/22   Red Yeast Rice [Cholestin] Other (See Comments)    Muscle pain and severe joint pain.    Rotigotine Swelling    (Neupro) Extreme edema    Atrovent Hfa [Ipratropium Bromide Hfa] Other (See Comments)    wheezing   Iodine Swelling   Other Other (See Comments)    Surgical Staples causes redness/infection per patient.   Pepto-Bismol [Bismuth Subsalicylate] Nausea And Vomiting    Extreme vomiting   Victoza [Liraglutide]     Unsure of reaction    Enalapril Cough   Suprep [Na Sulfate-K Sulfate-Mg Sulf] Nausea And Vomiting   Tape Other (See Comments)    Electrodes causes skin breakdown  Consultations: ID, orthopedics   Procedures/Studies: MR FOOT LEFT WO CONTRAST  Result Date: 06/15/2022 CLINICAL DATA:  Foot swelling. Diabetic. Osteomyelitis suspected. Left foot ulcer for about a month. EXAM: MRI OF THE LEFT FOOT WITHOUT CONTRAST TECHNIQUE:  Multiplanar, multisequence MR imaging of the left forefoot was performed. No intravenous contrast was administered. COMPARISON:  left foot radiographs 06/14/2022 and 05/26/2021; CT left foot 01/04/2018 FINDINGS: Despite efforts by the technologist and patient, motion artifact is present on today's exam and could not be eliminated. This reduces exam sensitivity and specificity. Bones/Joint/Cartilage Note is made of the plantar lateral midfoot soft tissue ulcer in the region of the cuboid through the mid shaft of the fifth metatarsal as described below. There is again an old healed fracture of the fifth metatarsal shaft. No marrow edema is seen, with attention to the cuboid and fifth metatarsal bones closest to the soft tissue ulceration and soft tissue edema. Mild-to-moderate great toe metatarsophalangeal joint, first through fifth interphalangeal joint osteoarthritis. Moderate second and mild first, third, fourth, and fifth tarsometatarsal cartilage thinning and peripheral osteophytosis. Similar mild-to-moderate osteoarthritis of the navicular-cuneiform articulations. Ligaments The Lisfranc ligament complex is intact. Muscles and Tendons Mild-to-moderate edema throughout the intrinsic midfoot musculature, nonspecific myositis. Moderate fatty infiltration of the abductor digiti minimi muscle. There is also fatty infiltration of a proximal posterior aspect of the flexor digitorum brevis and abductor digiti minimi muscles. Soft tissues There is attenuation and irregularity of the plantar lateral midfoot soft tissues, with apparent ulceration of the overlying skin to the level of the subcutaneous fat in a region measuring up to approximately 2.9 cm along the short axis of the foot (axial series 4, image 22) and 4.2 cm along the longitudinal dimension of the foot (sagittal series 8, image 7). There is also edema within the adjacent abductor digiti minimi muscle body along the course of a fifth metatarsal. There is  moderate subcutaneous fat edema throughout the lateral, plantar, medial, and dorsal aspects of the midfoot and forefoot, greatest laterally. There is mild complex fluid within the deep aspect of the subcutaneous fat of the dorsal midfoot diffusely (series 5 images 11 through 20). IMPRESSION: 1. There is a broad soft tissue ulceration within the plantar lateral midfoot from the level of the cuboid through the mid fifth metatarsal shaft. Within the limitations of motion artifact, no definite bone marrow edema or cortical erosion to indicate acute osteomyelitis. 2. Old healed fracture of the fifth metatarsal shaft. 3. Moderate subcutaneous fat edema throughout the midfoot and forefoot. Findings are compatible with cellulitis. No walled-off fluid collection/abscess is seen. 4. Mild-to-moderate edema throughout the midfoot musculature, nonspecific myositis. Electronically Signed   By: Yvonne Kendall M.D.   On: 06/15/2022 12:39   DG Foot Complete Left  Result Date: 06/14/2022 CLINICAL DATA:  Wound at bottom of LEFT foot for 4 weeks, question osteomyelitis; history of gout and diabetes mellitus EXAM: LEFT FOOT - COMPLETE 3+ VIEW COMPARISON:  05/26/2021 FINDINGS: Osseous demineralization. Old healed posttraumatic deformity distal fifth metatarsal. Joint spaces preserved. Plantar calcaneal spur. No acute fracture, dislocation, or bone destruction. No definite soft tissue gas. IMPRESSION: No acute osseous abnormalities. Osseous demineralization with healed posttraumatic deformity of distal fifth metatarsal. Electronically Signed   By: Lavonia Dana M.D.   On: 06/14/2022 17:03      Subjective: Patient seen and examined the bedside today.  Hemodynamically stable.  Medically stable for discharge.  Discharge Exam: Vitals:   06/20/22 0801 06/20/22 0856  BP: 126/72   Pulse: 88  Resp: 18   Temp: 98.3 F (36.8 C)   SpO2: 90% 98%   Vitals:   06/19/22 1936 06/20/22 0415 06/20/22 0801 06/20/22 0856  BP: (!) 105/55  (!) 106/55 126/72   Pulse: 78 75 88   Resp: $Remo'18 16 18   'ozvua$ Temp: 98 F (36.7 C)  98.3 F (36.8 C)   TempSrc: Oral  Oral   SpO2: 100% 98% 90% 98%  Weight:      Height:        General: Pt is alert, awake, not in acute distress,obese Cardiovascular: RRR, S1/S2 +, no rubs, no gallops Respiratory: CTA bilaterally, no wheezing, no rhonchi Abdominal: Soft, NT, ND, bowel sounds + Extremities: no edema, no cyanosis, right AKA, ulcer on the left foot    The results of significant diagnostics from this hospitalization (including imaging, microbiology, ancillary and laboratory) are listed below for reference.     Microbiology: Recent Results (from the past 240 hour(s))  Blood culture (routine x 2)     Status: None   Collection Time: 06/14/22  4:58 PM   Specimen: BLOOD  Result Value Ref Range Status   Specimen Description   Final    BLOOD LEFT ANTECUBITAL Performed at [facility], [address] [place], [place] [address]    Special Requests   Final    BOTTLES DRAWN AEROBIC AND ANAEROBIC Blood Culture adequate volume Performed at [facility], [address] [place], [place] [address]    Culture   Final    NO GROWTH 5 DAYS Performed at [facility], [place] [address] [place], [place] [address]    Report Status 06/19/2022 FINAL  Final  Blood culture (routine x 2)     Status: Abnormal   Collection Time: 06/14/22  5:02 PM   Specimen: BLOOD  Result Value Ref Range Status   Specimen Description   Final    BLOOD RIGHT ANTECUBITAL Performed at [facility], [address] [place], [place] [address]    Special Requests   Final    BOTTLES DRAWN AEROBIC AND ANAEROBIC Blood Culture results may not be optimal due to an excessive volume of blood received in culture bottles Performed at [facility], [address] [place], [place] [address]    Culture  Setup Time   Final    ANAEROBIC BOTTLE ONLY GRAM NEGATIVE RODS CRITICAL RESULT CALLED  TO, READ BACK BY AND VERIFIED WITH:  C/ PHARMD JAMES L. 06/17/22 0355 A. LAFRANCE    Culture (A)  Final    BACTEROIDES FRAGILIS BETA LACTAMASE POSITIVE Performed at [facility], [place] [address] [place], [place] [address]    Report Status 06/19/2022 FINAL  Final  Blood Culture ID Panel (Reflexed)     Status: Abnormal   Collection Time: 06/14/22  5:02 PM  Result Value Ref Range Status   Enterococcus faecalis NOT DETECTED NOT DETECTED Final   Enterococcus Faecium NOT DETECTED NOT DETECTED Final   Listeria monocytogenes NOT DETECTED NOT DETECTED Final   Staphylococcus species NOT DETECTED NOT DETECTED Final   Staphylococcus aureus (BCID) NOT DETECTED NOT DETECTED Final   Staphylococcus epidermidis NOT DETECTED NOT DETECTED Final   Staphylococcus lugdunensis NOT DETECTED NOT DETECTED Final   Streptococcus species NOT DETECTED NOT DETECTED Final   Streptococcus agalactiae NOT DETECTED NOT DETECTED Final   Streptococcus pneumoniae NOT DETECTED NOT DETECTED Final   Streptococcus pyogenes NOT DETECTED NOT DETECTED Final  A.calcoaceticus-baumannii NOT DETECTED NOT DETECTED Final   Bacteroides fragilis DETECTED (A) NOT DETECTED Final    Comment: CRITICAL RESULT CALLED TO, READ BACK BY AND VERIFIED WITH:  C/ PHARMD JAMES L. 06/17/22 0355 A. LAFRANCE    Enterobacterales NOT DETECTED NOT DETECTED Final   Enterobacter cloacae complex NOT DETECTED NOT DETECTED Final   Escherichia coli NOT DETECTED NOT DETECTED Final   Klebsiella aerogenes NOT DETECTED NOT DETECTED Final   Klebsiella oxytoca NOT DETECTED NOT DETECTED Final   Klebsiella pneumoniae NOT DETECTED NOT DETECTED Final   Proteus species NOT DETECTED NOT DETECTED Final   Salmonella species NOT DETECTED NOT DETECTED Final   Serratia marcescens NOT DETECTED NOT DETECTED Final   Haemophilus influenzae NOT DETECTED NOT DETECTED Final   Neisseria meningitidis NOT DETECTED NOT DETECTED Final   Pseudomonas aeruginosa NOT DETECTED NOT  DETECTED Final   Stenotrophomonas maltophilia NOT DETECTED NOT DETECTED Final   Candida albicans NOT DETECTED NOT DETECTED Final   Candida auris NOT DETECTED NOT DETECTED Final   Candida glabrata NOT DETECTED NOT DETECTED Final   Candida krusei NOT DETECTED NOT DETECTED Final   Candida parapsilosis NOT DETECTED NOT DETECTED Final   Candida tropicalis NOT DETECTED NOT DETECTED Final   Cryptococcus neoformans/gattii NOT DETECTED NOT DETECTED Final    Comment: Performed at [facility], [place] [address], [place], [place] [address]  MRSA Next Gen by PCR, Nasal     Status: Abnormal   Collection Time: 06/15/22  4:44 AM   Specimen: Nasal Mucosa; Nasal Swab  Result Value Ref Range Status   MRSA by PCR Next Gen DETECTED (A) NOT DETECTED Final    Comment: CRITICAL RESULT CALLED TO, READ BACK BY AND VERIFIED WITH:  C/ ABRAHAM O., RN 06/15/22 4097 A. LAFRANCE (NOTE) The GeneXpert MRSA Assay (FDA approved for NASAL specimens only), is one component of a comprehensive MRSA colonization surveillance program. It is not intended to diagnose MRSA infection nor to guide or monitor treatment for MRSA infections. Test performance is not FDA approved in patients less than 56 years old. Performed at [facility], [place] [address] [place], [place] [address]   Culture, blood (Routine X 2) w Reflex to ID Panel     Status: None (Preliminary result)   Collection Time: 06/17/22 12:20 PM   Specimen: BLOOD  Result Value Ref Range Status   Specimen Description BLOOD LEFT ANTECUBITAL  Final   Special Requests   Final    BOTTLES DRAWN AEROBIC AND ANAEROBIC Blood Culture adequate volume   Culture   Final    NO GROWTH 2 DAYS Performed at [facility], [place] [address], [place], [place] [address]    Report Status PENDING  Incomplete  Culture, blood (Routine X 2) w Reflex to ID Panel     Status: None (Preliminary result)   Collection Time: 06/17/22 12:27 PM   Specimen: BLOOD  Result Value Ref Range  Status   Specimen Description BLOOD BLOOD LEFT HAND  Final   Special Requests   Final    BOTTLES DRAWN AEROBIC AND ANAEROBIC Blood Culture adequate volume   Culture   Final    NO GROWTH 2 DAYS Performed at [facility], [place] [address] [place], [place] [address]    Report Status PENDING  Incomplete     Labs: BNP (last 3 results) Recent Labs    12/22/21 0304 01/09/22 1011  BNP 7.8 62.2   Basic Metabolic Panel: Recent Labs  Lab  06/14/22 1705 06/15/22 0212 06/16/22 0254 06/17/22 0217 06/19/22 0226  NA 139  --  136 133* 139  K 3.1* 3.1* 3.2* 3.6 4.1  CL 101  --  97* 96* 98  CO2 28  --  $R'28 25 28  'tM$ GLUCOSE 124*  --  232* 329* 214*  BUN 11  --  $R'8 10 9  'wD$ CREATININE 0.77 0.71 0.75 0.84 0.70  CALCIUM 8.7*  --  8.8* 8.4* 9.5  MG  --  1.6* 1.8  --   --    Liver Function Tests: Recent Labs  Lab 06/14/22 1705  AST 32  ALT 22  ALKPHOS 69  BILITOT 0.3  PROT 7.4  ALBUMIN 3.0*   No results for input(s): "LIPASE", "AMYLASE" in the last 168 hours. No results for input(s): "AMMONIA" in the last 168 hours. CBC: Recent Labs  Lab 06/14/22 0947 06/15/22 0212 06/16/22 0254 06/17/22 0217 06/19/22 0226  WBC 11.3* 11.1* 11.7* 10.6* 10.1  HGB 9.3* 8.9* 8.8* 8.9* 9.6*  HCT 32.2* 32.8* 32.2* 32.7* 35.8*  MCV 63.7 aL* 68.2* 67.9* 67.7* 68.3*  PLT 335.0 302 322 343 363   Cardiac Enzymes: No results for input(s): "CKTOTAL", "CKMB", "CKMBINDEX", "TROPONINI" in the last 168 hours. BNP: Invalid input(s): "POCBNP" CBG: Recent Labs  Lab 06/19/22 0806 06/19/22 1131 06/19/22 1645 06/19/22 2032 06/20/22 0757  GLUCAP 238* 268* 158* 213* 226*   D-Dimer No results for input(s): "DDIMER" in the last 72 hours. Hgb A1c No results for input(s): "HGBA1C" in the last 72 hours. Lipid Profile No results for input(s): "CHOL", "HDL", "LDLCALC", "TRIG", "CHOLHDL", "LDLDIRECT" in the last 72 hours. Thyroid function studies No results for input(s): "TSH", "T4TOTAL", "T3FREE",  "THYROIDAB" in the last 72 hours.  Invalid input(s): "FREET3" Anemia work up No results for input(s): "VITAMINB12", "FOLATE", "FERRITIN", "TIBC", "IRON", "RETICCTPCT" in the last 72 hours. Urinalysis    Component Value Date/Time   COLORURINE YELLOW 06/21/2021 0855   APPEARANCEUR CLOUDY (A) 06/21/2021 0855   LABSPEC 1.005 06/21/2021 0855   PHURINE 5.0 06/21/2021 0855   GLUCOSEU NEGATIVE 06/21/2021 0855   HGBUR NEGATIVE 06/21/2021 0855   BILIRUBINUR NEGATIVE 06/21/2021 0855   [place] 06/21/2021 0855   PROTEINUR NEGATIVE 06/21/2021 0855   UROBILINOGEN 0.2 02/10/2015 1835   NITRITE NEGATIVE 06/21/2021 0855   LEUKOCYTESUR LARGE (A) 06/21/2021 0855   Sepsis Labs Recent Labs  Lab 06/15/22 0212 06/16/22 0254 06/17/22 0217 06/19/22 0226  WBC 11.1* 11.7* 10.6* 10.1   Microbiology Recent Results (from the past 240 hour(s))  Blood culture (routine x 2)     Status: None   Collection Time: 06/14/22  4:58 PM   Specimen: BLOOD  Result Value Ref Range Status   Specimen Description   Final    BLOOD LEFT ANTECUBITAL Performed at [facility], [address] [place], [place] [address]    Special Requests   Final    BOTTLES DRAWN AEROBIC AND ANAEROBIC Blood Culture adequate volume Performed at [facility], [address] [place], [place] [address]    Culture   Final    NO GROWTH 5 DAYS Performed at [facility], [place] [address] [place], [place] [address]    Report Status 06/19/2022 FINAL  Final  Blood culture (routine x 2)     Status: Abnormal   Collection Time: 06/14/22  5:02 PM   Specimen: BLOOD  Result Value Ref Range Status   Specimen Description   Final    BLOOD RIGHT  ANTECUBITAL Performed at [facility], [address] [place], [place] [address]    Special Requests   Final    BOTTLES DRAWN AEROBIC AND ANAEROBIC Blood Culture results may not be optimal due to an excessive volume of blood received in culture  bottles Performed at [facility], [address] [place], [place] [address]    Culture  Setup Time   Final    ANAEROBIC BOTTLE ONLY GRAM NEGATIVE RODS CRITICAL RESULT CALLED TO, READ BACK BY AND VERIFIED WITH:  C/ PHARMD JAMES L. 06/17/22 0355 A. LAFRANCE    Culture (A)  Final    BACTEROIDES FRAGILIS BETA LACTAMASE POSITIVE Performed at [facility], [place] [address], [place], [place] [address]    Report Status 06/19/2022 FINAL  Final  Blood Culture ID Panel (Reflexed)     Status: Abnormal   Collection Time: 06/14/22  5:02 PM  Result Value Ref Range Status   Enterococcus faecalis NOT DETECTED NOT DETECTED Final   Enterococcus Faecium NOT DETECTED NOT DETECTED Final   Listeria monocytogenes NOT DETECTED NOT DETECTED Final   Staphylococcus species NOT DETECTED NOT DETECTED Final   Staphylococcus aureus (BCID) NOT DETECTED NOT DETECTED Final   Staphylococcus epidermidis NOT DETECTED NOT DETECTED Final   Staphylococcus lugdunensis NOT DETECTED NOT DETECTED Final   Streptococcus species NOT DETECTED NOT DETECTED Final   Streptococcus agalactiae NOT DETECTED NOT DETECTED Final   Streptococcus pneumoniae NOT DETECTED NOT DETECTED Final   Streptococcus pyogenes NOT DETECTED NOT DETECTED Final   A.calcoaceticus-baumannii NOT DETECTED NOT DETECTED Final   Bacteroides fragilis DETECTED (A) NOT DETECTED Final    Comment: CRITICAL RESULT CALLED TO, READ BACK BY AND VERIFIED WITH:  C/ PHARMD JAMES L. 06/17/22 0355 A. LAFRANCE    Enterobacterales NOT DETECTED NOT DETECTED Final   Enterobacter cloacae complex NOT DETECTED NOT DETECTED Final   Escherichia coli NOT DETECTED NOT DETECTED Final   Klebsiella aerogenes NOT DETECTED NOT DETECTED Final   Klebsiella oxytoca NOT DETECTED NOT DETECTED Final   Klebsiella pneumoniae NOT DETECTED NOT DETECTED Final   Proteus species NOT DETECTED NOT DETECTED Final   Salmonella species NOT DETECTED NOT DETECTED Final   Serratia marcescens  NOT DETECTED NOT DETECTED Final   Haemophilus influenzae NOT DETECTED NOT DETECTED Final   Neisseria meningitidis NOT DETECTED NOT DETECTED Final   Pseudomonas aeruginosa NOT DETECTED NOT DETECTED Final   Stenotrophomonas maltophilia NOT DETECTED NOT DETECTED Final   Candida albicans NOT DETECTED NOT DETECTED Final   Candida auris NOT DETECTED NOT DETECTED Final   Candida glabrata NOT DETECTED NOT DETECTED Final   Candida krusei NOT DETECTED NOT DETECTED Final   Candida parapsilosis NOT DETECTED NOT DETECTED Final   Candida tropicalis NOT DETECTED NOT DETECTED Final   Cryptococcus neoformans/gattii NOT DETECTED NOT DETECTED Final    Comment: Performed at [facility], [place] [address], [place], [place] [address]  MRSA Next Gen by PCR, Nasal     Status: Abnormal   Collection Time: 06/15/22  4:44 AM   Specimen: Nasal Mucosa; Nasal Swab  Result Value Ref Range Status   MRSA by PCR Next Gen DETECTED (A) NOT DETECTED Final    Comment: CRITICAL RESULT CALLED TO, READ BACK BY AND VERIFIED WITH:  C/ ABRAHAM O., RN 06/15/22 9833 A. LAFRANCE (NOTE) The GeneXpert MRSA Assay (FDA approved for NASAL specimens only), is one component of a comprehensive MRSA colonization surveillance program. It is not intended to diagnose  MRSA infection nor to guide or monitor treatment for MRSA infections. Test performance is not FDA approved in patients less than 76 years old. Performed at [facility], [place] [address], [place], [place] [address]   Culture, blood (Routine X 2) w Reflex to ID Panel     Status: None (Preliminary result)   Collection Time: 06/17/22 12:20 PM   Specimen: BLOOD  Result Value Ref Range Status   Specimen Description BLOOD LEFT ANTECUBITAL  Final   Special Requests   Final    BOTTLES DRAWN AEROBIC AND ANAEROBIC Blood Culture adequate volume   Culture   Final    NO GROWTH 2 DAYS Performed at [facility], [place] [address] [place], [place] [address]    Report Status  PENDING  Incomplete  Culture, blood (Routine X 2) w Reflex to ID Panel     Status: None (Preliminary result)   Collection Time: 06/17/22 12:27 PM   Specimen: BLOOD  Result Value Ref Range Status   Specimen Description BLOOD BLOOD LEFT HAND  Final   Special Requests   Final    BOTTLES DRAWN AEROBIC AND ANAEROBIC Blood Culture adequate volume   Culture   Final    NO GROWTH 2 DAYS Performed at [facility], [place] [address] [place], [place] [address]    Report Status PENDING  Incomplete    Please note: You were cared for by a hospitalist during your hospital stay. Once you are discharged, your primary care physician will handle any further medical issues. Please note that NO REFILLS for any discharge medications will be authorized once you are discharged, as it is imperative that you return to your primary care physician (or establish a relationship with a primary care physician if you do not have one) for your post hospital discharge needs so that they can reassess your need for medications and monitor your lab values.    Time coordinating discharge: 40 minutes  SIGNED:   Shelly Coss, MD  [facility] 06/20/2022, 11:04 AM Pager [phone]  If 7PM-7AM, please contact night-coverage [email or web address] Password [email or web address]

## 2022-06-20 NOTE — Progress Notes (Signed)
Mobility Specialist Progress Note   06/20/22 1210  Mobility  Activity Transferred from bed to chair;Transferred from chair to bed  Level of Assistance Contact guard assist, steadying assist  Assistive Device Front wheel walker  Distance Ambulated (ft) 4 ft  Activity Response Tolerated well  $Mobility charge 1 Mobility   Received pt at E[facility] having pain in L foot pain(6/10) but agreeable to mobility. Showing good demonstration of pivot step and hop gait, MinG throughout transfer. Pt able to do x5 STS w/o fault and return back to [facility]. Call bell in reach w/ needs met.     Holland Falling Mobility Specialist [facility] #:  [phone] Acute Rehab Office:  [phone]

## 2022-06-20 NOTE — TOC Transition Note (Addendum)
Transition of Care [facility]) - CM/SW Discharge Note   Patient Details  Name: [name] MRN: [id number] Date of Birth: October 08, 1960  [facility]) CM/SW Contact:  Sharin Mons, RN Phone Number: 06/20/2022, 8:23 AM   Clinical Narrative:    Patient will DC to: home Anticipated DC date: 06/20/2022 Family notified: yes Transport by: car      - Left foot diabetic wound infection Per MD patient ready for DC today. RN, patient,  and patient's family notified of DC. Pt states husband to assist with care once d/c to home. Order noted  for home health services. Pt agreeable to home health services. Pt without preference provider. Referral made with [facility] and accepted. Pt without DME needs , already has RW, BSC and shower stiool @ home. Pt without RX med concerns. Post hospital f/u noted on AVS. Husband to provide transportation to home.   RNCM will sign off for now as intervention is no longer needed. Please consult [place] again if new needs arise.    Final next level of care: Home w Home Health Services Barriers to Discharge: No Barriers Identified   Patient Goals and CMS Choice     Choice offered to / list presented to : Patient  Discharge Placement                       Discharge Plan and Services                          HH Arranged: PT, OT [facility] Agency: [place] Date [place]: 06/20/22 Time [place]: [phone] Representative spoke with at [place]: [place] (Fellows) Interventions Housing Interventions: Intervention Not Indicated   Readmission Risk Interventions    12/23/2021    4:00 PM  Readmission Risk Prevention Plan  Transportation Screening Complete  Medication Review Press photographer) Complete  PCP or Specialist appointment within 3-5 days of discharge Complete  HRI or [place] Complete  SW Recovery Care/Counseling Consult Complete  [place] Not Applicable

## 2022-06-20 NOTE — Progress Notes (Signed)
Occupational Therapy Treatment Patient Details Name: [name] MRN: [id number] DOB: 13-Jul-1961 Today's Date: 06/20/2022   History of present illness 61 year old female who was admitted for the concern of left foot diabetic wound infection. MRI suggestive of cellulitis.  History of morbid obesity, diabetes type 2, right AKA, irritable bowel syndrome, hypertension, hyperlipidemia, hypothyroidism, rheumatoid arthritis, asthma   OT comments  PT given resources for NCBAM in addition to information on  MedAlert fall alert system. Educated pt on use of compensatory strategies to help with bed mobility to reduce risk of falls. Continue to recommend follow up with [place].    Recommendations for follow up therapy are one component of a multi-disciplinary discharge planning process, led by the attending physician.  Recommendations may be updated based on patient status, additional functional criteria and insurance authorization.    Follow Up Recommendations  Home health OT    Assistance Recommended at Discharge Intermittent Supervision/Assistance  Patient can return home with the following  A little help with walking and/or transfers;A little help with bathing/dressing/bathroom;Assistance with cooking/housework;Assist for transportation;Help with stairs or ramp for entrance   Equipment Recommendations  Other (comment)    Recommendations for Other Services      Precautions / Restrictions Precautions Precautions: Fall Other Brace: prevalon - encouraged to wear when sleeping or when taking naps       Mobility Bed Mobility                    Transfers Overall transfer level: Modified independent                       Balance                                           ADL either performed or assessed with clinical judgement   ADL                                         General ADL Comments: Pt states she has had more difficulty  getting in/out of bed. Issued gait belt for her to use as leg lifter; also discussed modification to bed to increase safety and independence with bed mobility. Gave resource for [employer]    Extremity/Trunk Assessment Upper Extremity Assessment Upper Extremity Assessment: Overall WFL for tasks assessed            Vision       Perception     Praxis      Cognition Arousal/Alertness: Awake/alert Behavior During Therapy: WFL for tasks assessed/performed Overall Cognitive Status: Within Functional Limits for tasks assessed                                          Exercises Exercises: Other exercises Other Exercises Other Exercises: Issued level 2 theraband for BUE strengthening; Educated on importance of arm and back strengthening to help with transfers; pt verbalized understanding.    Shoulder Instructions       General Comments      Pertinent Vitals/ Pain       Pain Assessment Pain Assessment: 0-10 Pain Score: 5  Pain Location: L foot Pain Descriptors / Indicators: Discomfort Pain  Intervention(s): Limited activity within patient's tolerance  Home Living                                          Prior Functioning/Environment              Frequency  Min 2X/week        Progress Toward Goals  OT Goals(current goals can now be found in the care plan section)  Progress towards OT goals: Progressing toward goals  Acute Rehab OT Goals Patient Stated Goal: to do more around her house OT Goal Formulation: With patient Time For Goal Achievement: 07/03/22 Potential to Achieve Goals: Good ADL Goals Pt Will Perform Lower Body Bathing: with modified independence Additional ADL Goal #1: Pt will verbalize 3 strategis to use for item transport for IADL tasks  Plan Discharge plan remains appropriate    Co-evaluation                 AM-PAC OT "6 Clicks" Daily Activity     Outcome Measure   Help from another person eating  meals?: None Help from another person taking care of personal grooming?: A Little Help from another person toileting, which includes using toliet, bedpan, or urinal?: A Little Help from another person bathing (including washing, rinsing, drying)?: A Little Help from another person to put on and taking off regular upper body clothing?: A Little Help from another person to put on and taking off regular lower body clothing?: A Little 6 Click Score: 19    End of Session Equipment Utilized During Treatment: Rolling walker (2 wheels)  OT Visit Diagnosis: Unsteadiness on feet (R26.81);Pain Pain - Right/Left: Left Pain - part of body: Leg   Activity Tolerance Patient tolerated treatment well   Patient Left Other (comment) (on [facility])   Nurse Communication Other (comment) (DC needs)        Time: 3748-2707 OT Time Calculation (min): 22 min  Charges: OT General Charges $OT Visit: 1 Visit OT Treatments $Self Care/Home Management : 8-22 mins  Luisa Dago, OT/L   Acute OT Clinical Specialist Acute Rehabilitation Services Pager [phone] Office [phone]   [facility] 06/20/2022, 9:52 AM

## 2022-06-21 ENCOUNTER — Telehealth: Payer: Self-pay | Admitting: Orthopedic Surgery

## 2022-06-21 ENCOUNTER — Telehealth: Payer: Self-pay

## 2022-06-21 NOTE — Telephone Encounter (Signed)
Transition Care Management Unsuccessful Follow-up Telephone Call  Date of discharge and from where:  06/20/22, [facility].   Attempts:  1st Attempt  Reason for unsuccessful TCM follow-up call:  Unable to reach patient

## 2022-06-21 NOTE — Telephone Encounter (Signed)
Called and advised tha tthe pt wanted to try and continue with wound care and abx. Gave verbal ok to angela to add HHN to order sent by case manager at hospital for PT and OT. Pt has a follow up appt on 06/29/2022 in office

## 2022-06-21 NOTE — Telephone Encounter (Signed)
anglea beeson  nurse at   [facility]   wants some information about the consult that dr Sharol Given had with this Patient on 06/19/22. The best number to reach her [phone]

## 2022-06-22 LAB — CULTURE, BLOOD (ROUTINE X 2)
Culture: NO GROWTH
Culture: NO GROWTH
Special Requests: ADEQUATE
Special Requests: ADEQUATE

## 2022-06-22 NOTE — Telephone Encounter (Signed)
Transition Care Management Unsuccessful Follow-up Telephone Call  Date of discharge and from where:  06/20/22, [facility]  Attempts:  2nd Attempt  Reason for unsuccessful TCM follow-up call:  Unable to reach patient

## 2022-06-23 ENCOUNTER — Encounter (HOSPITAL_BASED_OUTPATIENT_CLINIC_OR_DEPARTMENT_OTHER): Payer: Commercial Managed Care - HMO | Admitting: General Surgery

## 2022-06-26 ENCOUNTER — Telehealth: Payer: Self-pay | Admitting: Orthopedic Surgery

## 2022-06-26 ENCOUNTER — Telehealth: Payer: Self-pay | Admitting: Family Medicine

## 2022-06-26 NOTE — Telephone Encounter (Signed)
Called left [facility] a detailed message of ok per PCP for patients sliding scale.

## 2022-06-26 NOTE — Telephone Encounter (Signed)
[name] with [facility] health called.  [name] wants to make sure its okay for the pt to use dermawound cream.  Nursing 1 w 10    CB [phone]

## 2022-06-26 NOTE — Telephone Encounter (Signed)
Transition Care Management Follow-up Telephone Call Date of discharge and from where: 06/20/2022, [facility].  How have you been since you were released from the hospital? "I"m okay" Any questions or concerns? No  Items Reviewed: Did the pt receive and understand the discharge instructions provided? Yes  Medications obtained and verified? Yes  Other? No  Any new allergies since your discharge? No  Dietary orders reviewed? No Do you have support at home? Yes   Home Care and Equipment/Supplies: Were home health services ordered? yes If so, what is the name of the agency? [facility]  Has the agency set up a time to come to the patient's home? yes Were any new equipment or medical supplies ordered?  No What is the name of the medical supply agency? NA Were you able to get the supplies/equipment? not applicable Do you have any questions related to the use of the equipment or supplies? No  Functional Questionnaire: (I = Independent and D = Dependent) ADLs: I  Bathing/Dressing- I  Meal Prep- I  Eating- I  Maintaining continence- I  Transferring/Ambulation- I  Managing Meds- I  All ADL's with assistance from husband.   Follow up appointments reviewed:  PCP Hospital f/u appt confirmed? No  Patient states she will call back to schedule.  [facility] f/u appt confirmed? Yes  Scheduled to see Ortho on 06/29/22, ID on 07/07/22 and Phys Med and Rehab on 06/27/22.  Are transportation arrangements needed? No  If their condition worsens, is the pt aware to call PCP or go to the Emergency Dept.? Yes Was the patient provided with contact information for the PCP's office or ED? Yes Was to pt encouraged to call back with questions or concerns? Yes

## 2022-06-26 NOTE — Telephone Encounter (Signed)
Sorry the RN from [place] said the patient had just made up her own sliding scale, she was just concerned if ok or not

## 2022-06-26 NOTE — Telephone Encounter (Signed)
Her sliding scale was--BS 161-170-35 units  - BS 70-110 - 5 units. BS 111-120--10 units BS 121-130--15 units

## 2022-06-26 NOTE — Telephone Encounter (Signed)
[name] from [phone] She wants to know if flexeril, Kerin Salen and   what is her [place] sliding scale? The patient made up her own sliding scale.

## 2022-06-27 ENCOUNTER — Encounter: Payer: Commercial Managed Care - HMO | Attending: Registered Nurse | Admitting: Registered Nurse

## 2022-06-27 ENCOUNTER — Encounter: Payer: Self-pay | Admitting: Registered Nurse

## 2022-06-27 VITALS — BP 147/78 | HR 85 | Ht 62.0 in

## 2022-06-27 DIAGNOSIS — M545 Low back pain, unspecified: Secondary | ICD-10-CM

## 2022-06-27 DIAGNOSIS — Z79899 Other long term (current) drug therapy: Secondary | ICD-10-CM | POA: Diagnosis present

## 2022-06-27 DIAGNOSIS — G8929 Other chronic pain: Secondary | ICD-10-CM | POA: Diagnosis present

## 2022-06-27 DIAGNOSIS — M546 Pain in thoracic spine: Secondary | ICD-10-CM | POA: Insufficient documentation

## 2022-06-27 DIAGNOSIS — M7062 Trochanteric bursitis, left hip: Secondary | ICD-10-CM | POA: Insufficient documentation

## 2022-06-27 DIAGNOSIS — G894 Chronic pain syndrome: Secondary | ICD-10-CM

## 2022-06-27 DIAGNOSIS — L03116 Cellulitis of left lower limb: Secondary | ICD-10-CM

## 2022-06-27 DIAGNOSIS — M25512 Pain in left shoulder: Secondary | ICD-10-CM | POA: Diagnosis present

## 2022-06-27 DIAGNOSIS — Z5181 Encounter for therapeutic drug level monitoring: Secondary | ICD-10-CM

## 2022-06-27 DIAGNOSIS — M2550 Pain in unspecified joint: Secondary | ICD-10-CM | POA: Diagnosis present

## 2022-06-27 DIAGNOSIS — M25511 Pain in right shoulder: Secondary | ICD-10-CM | POA: Insufficient documentation

## 2022-06-27 DIAGNOSIS — M7061 Trochanteric bursitis, right hip: Secondary | ICD-10-CM

## 2022-06-27 MED ORDER — OXYCODONE HCL 20 MG PO TABS: 1.0000 | ORAL_TABLET | Freq: Three times a day (TID) | ORAL | 0 refills | Status: DC | PRN

## 2022-06-27 MED ORDER — LIDOCAINE 5 % EX PTCH: 1.0000 | MEDICATED_PATCH | Freq: Two times a day (BID) | CUTANEOUS | 4 refills | Status: DC

## 2022-06-27 NOTE — Progress Notes (Unsigned)
Subjective:    Patient ID: [name], female    DOB: 28-Dec-1960, 61 y.o.   MRN: [id number]  HPI: [name] is a 61 y.o. female who returns for follow up appointment for chronic pain and medication refill. states *** pain is located in  ***. rates pain ***. current exercise regime is walking and performing stretching exercises.  Ms. Winterhalter Morphine equivalent is *** MME.   Oral Swab ordered today.    Pain Inventory Average Pain 7 Pain Right Now 5 My pain is intermittent, constant, sharp, burning, stabbing, tingling, and aching  In the last 24 hours, has pain interfered with the following? General activity 7 Relation with others 7 Enjoyment of life 2 What TIME of day is your pain at its worst? night Sleep (in general) Poor  Pain is worse with: unsure and some activites Pain improves with: heat/ice and medication Relief from Meds: 7  Family History  Problem Relation Age of Onset   CAD Mother    Hypertension Mother    Heart attack Mother    Stroke Mother    CAD Father    Heart attack Father    Allergic rhinitis Father    Asthma Father    Hypertension Brother    Hypertension Brother    Pancreatic cancer Paternal Aunt    Breast cancer Paternal Aunt    Lung cancer Paternal Aunt    Allergic rhinitis Son    Asthma Son    Social History   Socioeconomic History   Marital status: Married    Spouse name: [name] Gieselman   Number of children: 1   Years of education: 11   Highest education level: GED or equivalent  Occupational History    Comment: Not currrently working ( last worked 2004)  Tobacco Use   Smoking status: Former    Packs/day: 1.00    Years: 19.00    Total pack years: 19.00    Types: Cigarettes    Start date: 67    Quit date: 02/11/2004    Years since quitting: 18.3    Passive exposure: Past   Smokeless tobacco: Never  Vaping Use   Vaping Use: Never used  Substance and Sexual Activity   Alcohol use: Yes    Comment: RARE Wine   Drug use:  Not Currently    Types: Marijuana    Comment: "only in my teens"   Sexual activity: Yes    Birth control/protection: Surgical    Comment: Hysterectomy  Other Topics Concern   Not on file  Social History Narrative   Not on file   Social Determinants of Health   Financial Resource Strain: Low Risk  (12/27/2021)   Overall Financial Resource Strain (CARDIA)    Difficulty of Paying Living Expenses: Not hard at all  Food Insecurity: No Food Insecurity (06/14/2022)   Hunger Vital Sign    Worried About Running Out of Food in the Last Year: Never true    Ran Out of Food in the Last Year: Never true  Transportation Needs: No Transportation Needs (06/14/2022)   PRAPARE - Administrator, Civil Service (Medical): No    Lack of Transportation (Non-Medical): No  Physical Activity: Inactive (08/25/2021)   Exercise Vital Sign    Days of Exercise per Week: 0 days    Minutes of Exercise per Session: 0 min  Stress: Stress Concern Present (08/25/2021)   [employer] of Occupational Health - Occupational Stress Questionnaire    Feeling of Stress :  Very much  Social Connections: Moderately Integrated (08/25/2021)   Social Connection and Isolation Panel [NHANES]    Frequency of Communication with Friends and Family: More than three times a week    Frequency of Social Gatherings with Friends and Family: More than three times a week    Attends Religious Services: More than 4 times per year    Active Member of [employer] or Organizations: No    Attends Banker Meetings: Never    Marital Status: Married   Past Surgical History:  Procedure Laterality Date   ABDOMINAL HYSTERECTOMY  06/2005   "w/right ovariy"   AMPUTATION Right 01/28/2020    RIGHT FOURTH AND FIFTH RAY AMPUTATION   AMPUTATION Right 01/28/2020   Procedure: RIGHT FOURTH AND FIFTH RAY AMPUTATION,;  Surgeon: [name], MD;  [facility];  Service: Orthopedics;  Laterality: Right;   AMPUTATION Right 06/01/2021    Procedure: RIGHT BELOW KNEE AMPUTATION;  Surgeon: [name], MD;  Location: [facility] OR;  Service: Orthopedics;  Laterality: Right;   AMPUTATION Right 07/22/2021   Procedure: RIGHT ABOVE KNEE AMPUTATION;  Surgeon: [name], MD;  Location: [facility] OR;  Service: Orthopedics;  Laterality: Right;   APPENDECTOMY     BREAST BIOPSY Bilateral    9 total (04/18/2018)   CORONARY ANGIOPLASTY WITH STENT PLACEMENT  10/01/2015   normal LM, 30% LAD, 85% mid RCA and 95% distal RCA s/p PCI of the mid to distal RCA and now on DAPT with ASA and Ticagrelor.     CORONARY STENT INTERVENTION Right 04/18/2018   Procedure: CORONARY STENT INTERVENTION;  Surgeon: [name], MD;  [facility];  Service: Cardiovascular;  Laterality: Right;   DILATION AND CURETTAGE OF UTERUS     FRACTURE SURGERY     LAPAROSCOPIC CHOLECYSTECTOMY     LEFT HEART CATH AND CORONARY ANGIOGRAPHY N/A 04/18/2018   Procedure: LEFT HEART CATH AND CORONARY ANGIOGRAPHY;  Surgeon: [name], MD;  [facility];  Service: Cardiovascular;  Laterality: N/A;   MUSCLE BIOPSY Left    "leg"   OOPHORECTOMY  07/2010   RADIOLOGY WITH ANESTHESIA N/A 05/25/2016   Procedure: RADIOLOGY WITH ANESTHESIA;  Surgeon: Medication Radiologist, MD;  [facility];  Service: Radiology;  Laterality: N/A;   STUMP REVISION Right 06/29/2021   Procedure: REVISION RIGHT BELOW KNEE AMPUTATION;  Surgeon: [name], MD;  Location[facility] OR;  Service: Orthopedics;  Laterality: Right;   TEE WITHOUT CARDIOVERSION N/A 06/01/2021   Procedure: TRANSESOPHAGEAL ECHOCARDIOGRAM (TEE);  Surgeon: Sande Rives, MD;  Location: [facility] OR;  Service: Cardiovascular;  Laterality: N/A;   TONSILLECTOMY AND ADENOIDECTOMY     WRIST FRACTURE SURGERY Left    "crushed it"   Past Surgical History:  Procedure Laterality Date   ABDOMINAL HYSTERECTOMY  06/2005   "w/right ovariy"   AMPUTATION Right 01/28/2020    RIGHT FOURTH AND FIFTH RAY  AMPUTATION   AMPUTATION Right 01/28/2020   Procedure: RIGHT FOURTH AND FIFTH RAY AMPUTATION,;  Surgeon: [name], MD;  [facility];  Service: Orthopedics;  Laterality: Right;   AMPUTATION Right 06/01/2021   Procedure: RIGHT BELOW KNEE AMPUTATION;  Surgeon: [name], MD;  Location: [facility] OR;  Service: Orthopedics;  Laterality: Right;   AMPUTATION Right 07/22/2021   Procedure: RIGHT ABOVE KNEE AMPUTATION;  Surgeon: [name], MD;  Location: [facility] OR;  Service: Orthopedics;  Laterality: Right;   APPENDECTOMY     BREAST BIOPSY Bilateral  9 total (04/18/2018)   CORONARY ANGIOPLASTY WITH STENT PLACEMENT  10/01/2015   normal LM, 30% LAD, 85% mid RCA and 95% distal RCA s/p PCI of the mid to distal RCA and now on DAPT with ASA and Ticagrelor.     CORONARY STENT INTERVENTION Right 04/18/2018   Procedure: CORONARY STENT INTERVENTION;  Surgeon: Burnell Blanks, MD;  Location: [place] CV LAB;  Service: Cardiovascular;  Laterality: Right;   DILATION AND CURETTAGE OF UTERUS     FRACTURE SURGERY     LAPAROSCOPIC CHOLECYSTECTOMY     LEFT HEART CATH AND CORONARY ANGIOGRAPHY N/A 04/18/2018   Procedure: LEFT HEART CATH AND CORONARY ANGIOGRAPHY;  Surgeon: Burnell Blanks, MD;  Location: [place] CV LAB;  Service: Cardiovascular;  Laterality: N/A;   MUSCLE BIOPSY Left    "leg"   OOPHORECTOMY  07/2010   RADIOLOGY WITH ANESTHESIA N/A 05/25/2016   Procedure: RADIOLOGY WITH ANESTHESIA;  Surgeon: Medication Radiologist, MD;  Location: [place];  Service: Radiology;  Laterality: N/A;   STUMP REVISION Right 06/29/2021   Procedure: REVISION RIGHT BELOW KNEE AMPUTATION;  Surgeon: Newt Minion, MD;  Location: [place];  Service: Orthopedics;  Laterality: Right;   TEE WITHOUT CARDIOVERSION N/A 06/01/2021   Procedure: TRANSESOPHAGEAL ECHOCARDIOGRAM (TEE);  Surgeon: Geralynn Rile, MD;  Location: [place];  Service: Cardiovascular;  Laterality: N/A;   TONSILLECTOMY AND ADENOIDECTOMY     WRIST  FRACTURE SURGERY Left    "crushed it"   Past Medical History:  Diagnosis Date   Aortic stenosis    mild AS by echo 02/2021   Asthma    "on daily RX and rescue inhaler" (04/18/2018)   Chronic diastolic CHF (congestive heart failure) ([place]) 09/2015   Chronic lower back pain    Chronic neck pain    Chronic pain syndrome    Fentanyl Patch   Colon polyps    Coronary artery disease    cath with normal LM, 30% LAD, 85% mid RCA and 95% distal RCA s/p PCI of the mid to distal RCA and now on DAPT with ASA and Ticagrelor.     Eczema    Excessive daytime sleepiness 11/26/2015   Fibromyalgia    Gallstones    GERD (gastroesophageal reflux disease)    Heart murmur    "noted for the 1st time on 04/18/2018"   History of blood transfusion 07/2010   "S/P oophorectomy"   History of gout    History of hiatal hernia 1980s   "gone now" (04/18/2018)   Hyperlipidemia    Hypertension    takes Metoprolol and Enalapril daily   Hypothyroidism    takes Synthroid daily   IBS (irritable bowel syndrome)    Migraine    "nothing in the 2000s" (04/18/2018)   Mixed connective tissue disease ([place])    NAFLD (nonalcoholic fatty liver disease)    Pneumonia    "several times" (04/18/2018)   PVC's (premature ventricular contractions)    noted on event monitor 02/2021   Rheumatoid arthritis ([place])    "hands, elbows, shoulders, probably knees" (04/18/2018)   Scoliosis    Sjogren's syndrome ([place])    Sleep apnea    mild - does not use cpap   Spondylosis    Type II diabetes mellitus ([place])    takes Metformin and Hum R daily (04/18/2018)   Walker as ambulation aid    also uses wheelchair   There were no vitals taken for this visit.  Opioid Risk Score:   Fall Risk Score:  `  1  Depression screen [facility] 2/9     05/02/2022   11:56 AM 03/30/2022   11:08 AM 02/27/2022    1:37 PM 01/31/2022   10:18 AM 12/20/2021   10:56 AM 10/24/2021   12:21 PM 09/27/2021    9:50 AM  Depression screen PHQ 2/9  Decreased Interest 1 1 1  0 3 1 0   Down, Depressed, Hopeless 1 1 1  0 3 1 0  PHQ - 2 Score 2 2 2  0 6 2 0  Altered sleeping       0  PHQ-9 Score       0      Review of Systems  Musculoskeletal:  Positive for back pain, gait problem and neck pain.       Pain in all major joints, left leg pain  All other systems reviewed and are negative.      Objective:   Physical Exam        Assessment & Plan:

## 2022-06-27 NOTE — Telephone Encounter (Signed)
Given VO okay to [place].

## 2022-06-29 ENCOUNTER — Telehealth: Payer: Self-pay | Admitting: Orthopedic Surgery

## 2022-06-29 ENCOUNTER — Ambulatory Visit: Payer: Commercial Managed Care - HMO | Admitting: Orthopedic Surgery

## 2022-06-29 ENCOUNTER — Telehealth: Payer: Self-pay | Admitting: Registered Nurse

## 2022-06-29 DIAGNOSIS — L97521 Non-pressure chronic ulcer of other part of left foot limited to breakdown of skin: Secondary | ICD-10-CM | POA: Diagnosis not present

## 2022-06-29 DIAGNOSIS — S78111A Complete traumatic amputation at level between right hip and knee, initial encounter: Secondary | ICD-10-CM

## 2022-06-29 DIAGNOSIS — Z89611 Acquired absence of right leg above knee: Secondary | ICD-10-CM | POA: Diagnosis not present

## 2022-06-29 MED ORDER — DICLOFENAC SODIUM 1 % EX GEL: 2.0000 g | Freq: Four times a day (QID) | CUTANEOUS | 0 refills | Status: DC

## 2022-06-29 NOTE — Telephone Encounter (Signed)
Noted, pt has an appointment this morning, have the request to address this during the visit.

## 2022-06-29 NOTE — Telephone Encounter (Signed)
The patient spouse requested delay until after she sees physician.  Tommi Emery from [facility] ot therapist would like a call back with the patient  weight baring status  [phone]

## 2022-06-29 NOTE — Telephone Encounter (Signed)
Ms. Paulsen, was called today.  She was requesting Voltaren Gel, she used it in the past without any adverse reactions.  Voltaren Gel ordered today, she verbalizes understanding.

## 2022-06-30 LAB — DRUG TOX MONITOR 1 W/CONF, ORAL FLD
Amphetamines: NEGATIVE ng/mL (ref <10)
Barbiturates: NEGATIVE ng/mL (ref <10)
Benzodiazepines: NEGATIVE ng/mL (ref <0.50)
Buprenorphine: NEGATIVE ng/mL (ref <0.10)
Cocaine: NEGATIVE ng/mL (ref <5.0)
Codeine: NEGATIVE ng/mL (ref <2.5)
Dihydrocodeine: NEGATIVE ng/mL (ref <2.5)
Fentanyl: NEGATIVE ng/mL (ref <0.10)
Heroin Metabolite: NEGATIVE ng/mL (ref <1.0)
Hydrocodone: NEGATIVE ng/mL (ref <2.5)
Hydromorphone: NEGATIVE ng/mL (ref <2.5)
MARIJUANA: NEGATIVE ng/mL (ref <2.5)
MDMA: NEGATIVE ng/mL (ref <10)
Meprobamate: NEGATIVE ng/mL (ref <2.5)
Methadone: NEGATIVE ng/mL (ref <5.0)
Morphine: NEGATIVE ng/mL (ref <2.5)
Nicotine Metabolite: NEGATIVE ng/mL (ref <5.0)
Norhydrocodone: NEGATIVE ng/mL (ref <2.5)
Noroxycodone: 35.6 ng/mL — ABNORMAL HIGH (ref <2.5)
Opiates: POSITIVE ng/mL — AB (ref <2.5)
Oxycodone: >250 ng/mL — ABNORMAL HIGH (ref <2.5)
Oxymorphone: 3.5 ng/mL — ABNORMAL HIGH (ref <2.5)
Phencyclidine: NEGATIVE ng/mL (ref <10)
Tapentadol: NEGATIVE ng/mL (ref <5.0)
Tramadol: NEGATIVE ng/mL (ref <5.0)
Zolpidem: NEGATIVE ng/mL (ref <5.0)

## 2022-06-30 LAB — DRUG TOX ALC METAB W/CON, ORAL FLD: Alcohol Metabolite: NEGATIVE ng/mL (ref <25)

## 2022-06-30 NOTE — Telephone Encounter (Signed)
WBAT

## 2022-06-30 NOTE — Telephone Encounter (Signed)
Don informed

## 2022-07-03 ENCOUNTER — Telehealth: Payer: Self-pay | Admitting: *Deleted

## 2022-07-03 NOTE — Telephone Encounter (Signed)
Oral swab drug screen was consistent for prescribed medications.  ?

## 2022-07-04 ENCOUNTER — Encounter: Payer: Self-pay | Admitting: Orthopedic Surgery

## 2022-07-04 NOTE — Progress Notes (Signed)
Office Visit Note   Patient: [name]           Date of Birth: 10-19-1960           MRN: [id number] Visit Date: 06/29/2022              Requested by: Shelda Pal, DO [address] [place],  [place] [address] PCP: Shelda Pal, DO  Chief Complaint  Patient presents with   Left Foot - Wound Check      HPI: Patient is a 61 year old woman status post right above-the-knee amputation who is seen for evaluation for left foot diabetic ulcer.  She is currently on Augmentin.  Patient complains of weakness with her upper extremities secondary to her mixed connective tissue disease.  Assessment & Plan: Visit Diagnoses:  1. Unilateral AKA, right ([place])   2. Non-pressure chronic ulcer of other part of left foot limited to breakdown of skin ([place])     Plan: I have recommended proceeding with a amputation.  Patient states she would like to have more time.  She was given a note for home health therapy that she may be weightbearing as tolerated on the left foot.  Prescription was provided for home health therapy  Follow-Up Instructions: No follow-ups on file.   Ortho Exam  Patient is alert, oriented, no adenopathy, well-dressed, normal affect, normal respiratory effort. Examination patient has ulceration left foot.  After informed consent a 10 blade knife was used debride the skin and soft tissue back to healthy tissue.  This left a wound that was 5 cm in diameter 1 cm deep this extended down to exposed bone.  Imaging: No results found. No images are attached to the encounter.  Labs: Lab Results  Component Value Date   HGBA1C 9.2 (H) 05/26/2022   HGBA1C 8.5 (H) 12/22/2021   HGBA1C 7.3 (H) 05/27/2021   ESRSEDRATE 124 Repeated and verified X2. (H) 06/14/2022   ESRSEDRATE 34 (H) 05/26/2022   ESRSEDRATE 31 (H) 04/07/2022   CRP 9.1 06/14/2022   CRP 9.8 (H) 01/02/2018   REPTSTATUS 06/22/2022 FINAL 06/17/2022   GRAMSTAIN  01/28/2020    ABUNDANT  WBC PRESENT,BOTH PMN AND MONONUCLEAR ABUNDANT GRAM POSITIVE COCCI ABUNDANT GRAM NEGATIVE RODS FEW GRAM POSITIVE RODS    CULT  06/17/2022    NO GROWTH 5 DAYS Performed at [facility], [place] [address], [place], [place] [address]    LABORGA ESCHERICHIA COLI (A) 06/21/2021   LABORGA PROTEUS MIRABILIS (A) 06/21/2021     Lab Results  Component Value Date   ALBUMIN 3.0 (L) 06/14/2022   ALBUMIN 3.0 (L) 12/23/2021   ALBUMIN 3.1 (L) 12/20/2021    Lab Results  Component Value Date   MG 1.8 06/16/2022   MG 1.6 (L) 06/15/2022   MG 1.8 12/24/2021   Lab Results  Component Value Date   VD25OH 69.04 07/25/2017    No results found for: "PREALBUMIN"    Latest Ref Rng & Units 06/19/2022    2:26 AM 06/17/2022    2:17 AM 06/16/2022    2:54 AM  CBC EXTENDED  WBC 4.0 - 10.5 K/uL 10.1  10.6  11.7   RBC 3.87 - 5.11 MIL/uL 5.24  4.83  4.74   Hemoglobin 12.0 - 15.0 g/dL 9.6  8.9  8.8   HCT 36.0 - 46.0 % 35.8  32.7  32.2   Platelets 150 - 400 K/uL 363  343  322      There  is no height or weight on file to calculate BMI.  Orders:  No orders of the defined types were placed in this encounter.  No orders of the defined types were placed in this encounter.    Procedures: No procedures performed  Clinical Data: No additional findings.  ROS:  All other systems negative, except as noted in the HPI. Review of Systems  Objective: Vital Signs: There were no vitals taken for this visit.  Specialty Comments:  No specialty comments available.  PMFS History: Patient Active Problem List   Diagnosis Date Noted   Diabetic ulcer of left midfoot associated with diabetes mellitus due to underlying condition, with necrosis of muscle ([place])    Diabetic foot infection ([place]) 06/14/2022   Sacral decubitus ulcer, stage II ([place]) 12/24/2021   Class 3 obesity ([place]) 12/23/2021   Iron deficiency anemia 12/23/2021   Acute on chronic diastolic CHF (congestive heart failure) ([place]) 12/22/2021    Hyperkalemia 12/22/2021   Left leg cellulitis 12/20/2021   Peripheral vascular disease, unspecified ([place]) 10/10/2021   Unilateral AKA, right ([place]) 10/10/2021   Hx of AKA (above knee amputation), right ([place]) 07/22/2021   Dehiscence of amputation stump (H[facility])    Acute blood loss anemia 06/24/2021   Postoperative wound infection 06/24/2021   UTI (urinary tract infection) 06/24/2021   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 ([place])    Bacteremia due to Enterococcus 05/28/2021   Sepsis ([place]) 05/26/2021   Digestive disorder 05/18/2021   Bilateral lower extremity edema 05/02/2021   Statin myopathy 03/04/2021   Aortic stenosis 03/04/2021   Sjogren's syndrome ([place]) 02/11/2020   Cutaneous abscess of right foot    Statin intolerance 08/16/2019   Gram-negative bacteremia 10/18/2018   Streptococcal bacteremia 10/18/2018   CAD S/P percutaneous coronary angioplasty 04/19/2018   Essential hypertension    Moderate persistent asthma 03/21/2018   NAFLD (nonalcoholic fatty liver disease)    Recurrent cellulitis of lower extremity 01/02/2018   Cellulitis of lower leg 01/02/2018   Chronic diarrhea 05/08/2017   H/O Clostridium difficile infection 05/08/2017   Rectal bleeding 05/08/2017   Hyperbilirubinemia 04/29/2016   Hyponatremia 04/29/2016   Cellulitis of right foot 03/09/2016   Allergic rhinitis due to pollen 02/15/2016   Anaphylactic reaction due to food 02/10/2016   Type 2 diabetes mellitus with hyperglycemia, with long-term current use of insulin ([place]) 02/10/2016   Gastroesophageal reflux disease without esophagitis 02/10/2016   Atopic eczema 02/10/2016   Cervical nerve root disorder 12/15/2015   Lumbar radiculopathy 12/15/2015   Daytime somnolence 11/26/2015   Venous stasis dermatitis of both lower extremities 02/11/2015   Morbid obesity ([place]) 06/19/2014   Abnormal LFTs 03/26/2014   B-complex deficiency 03/26/2014   Benign essential HTN 03/26/2014   Chronic pain  associated with significant psychosocial dysfunction 03/26/2014   Diaphragmatic hernia 03/26/2014   Gastroesophageal reflux disease 03/26/2014   Hammer toe 03/26/2014   H/O neoplasm 03/26/2014   Adaptive colitis 03/26/2014   Deafness, sensorineural 03/26/2014   Fibromyalgia 01/20/2014   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/20/2014   Hyperlipidemia 11/11/2012   Mixed connective tissue disease ([place]) 11/11/2012   Hypothyroidism 99991111   Uncomplicated asthma 99991111   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[place]) 02/20/2012   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     Eczema    Excessive daytime sleepiness 11/26/2015   Fibromyalgia    Gallstones    GERD (gastroesophageal reflux disease)    Heart murmur    "noted for the 1st time on 04/18/2018"   History of blood transfusion 07/2010   "S/P oophorectomy"   History of gout    History of hiatal hernia 1980s   "gone now" (04/18/2018)   Hyperlipidemia    Hypertension    takes Metoprolol and Enalapril daily   Hypothyroidism    takes Synthroid daily   IBS (irritable bowel syndrome)    Migraine    "nothing in the 2000s" (04/18/2018)   Mixed connective tissue disease ([place])    NAFLD (nonalcoholic fatty liver disease)    Pneumonia    "several times" (04/18/2018)   PVC's (premature ventricular contractions)    noted on event monitor 02/2021   Rheumatoid arthritis ([place])     "hands, elbows, shoulders, probably knees" (04/18/2018)   Scoliosis    Sjogren's syndrome ([place])    Sleep apnea    mild - does not use cpap   Spondylosis    Type II diabetes mellitus ([place])    takes Metformin and Hum R daily (04/18/2018)   Walker as ambulation aid    also uses wheelchair    Family History  Problem Relation Age of Onset   CAD Mother    Hypertension Mother    Heart attack Mother    Stroke Mother    CAD Father    Heart attack Father    Allergic rhinitis Father    Asthma Father    Hypertension Brother    Hypertension Brother    Pancreatic cancer Paternal Aunt    Breast cancer Paternal Aunt    Lung cancer Paternal Aunt    Allergic rhinitis Son    Asthma Son     Past Surgical History:  Procedure Laterality Date   ABDOMINAL HYSTERECTOMY  06/2005   "w/right ovariy"   AMPUTATION Right 01/28/2020    RIGHT FOURTH AND FIFTH RAY AMPUTATION   AMPUTATION Right 01/28/2020   Procedure: RIGHT FOURTH AND FIFTH RAY AMPUTATION,;  Surgeon: Newt Minion, MD;  Location: [place];  Service: Orthopedics;  Laterality: Right;   AMPUTATION Right 06/01/2021   Procedure: RIGHT BELOW KNEE AMPUTATION;  Surgeon: Newt Minion, MD;  Location: [place];  Service: Orthopedics;  Laterality: Right;   AMPUTATION Right 07/22/2021   Procedure: RIGHT ABOVE KNEE AMPUTATION;  Surgeon: Newt Minion, MD;  Location: [place];  Service: Orthopedics;  Laterality: Right;   APPENDECTOMY     BREAST BIOPSY Bilateral    9 total (04/18/2018)   CORONARY ANGIOPLASTY WITH STENT PLACEMENT  10/01/2015   normal LM, 30% LAD, 85% mid RCA and 95% distal RCA s/p PCI of the mid to distal RCA and now on DAPT with ASA and Ticagrelor.     CORONARY STENT INTERVENTION Right 04/18/2018   Procedure: CORONARY STENT INTERVENTION;  Surgeon: Burnell Blanks, MD;  Location: [place] [facility] LAB;  Service:  Cardiovascular;  Laterality: Right;   DILATION AND CURETTAGE OF UTERUS     FRACTURE SURGERY     LAPAROSCOPIC CHOLECYSTECTOMY     LEFT  HEART CATH AND CORONARY ANGIOGRAPHY N/A 04/18/2018   Procedure: LEFT HEART CATH AND CORONARY ANGIOGRAPHY;  Surgeon: Burnell Blanks, MD;  Location: [place] [facility] LAB;  Service: Cardiovascular;  Laterality: N/A;   MUSCLE BIOPSY Left    "leg"   OOPHORECTOMY  07/2010   RADIOLOGY WITH ANESTHESIA N/A 05/25/2016   Procedure: RADIOLOGY WITH ANESTHESIA;  Surgeon: Medication Radiologist, MD;  Location: [place];  Service: Radiology;  Laterality: N/A;   STUMP REVISION Right 06/29/2021   Procedure: REVISION RIGHT BELOW KNEE AMPUTATION;  Surgeon: Newt Minion, MD;  Location: [place];  Service: Orthopedics;  Laterality: Right;   TEE WITHOUT CARDIOVERSION N/A 06/01/2021   Procedure: TRANSESOPHAGEAL ECHOCARDIOGRAM (TEE);  Surgeon: Geralynn Rile, MD;  Location: [place];  Service: Cardiovascular;  Laterality: N/A;   TONSILLECTOMY AND ADENOIDECTOMY     WRIST FRACTURE SURGERY Left    "crushed it"   Social History   Occupational History    Comment: Not currrently working ( last worked 2004)  Tobacco Use   Smoking status: Former    Packs/day: 1.00    Years: 19.00    Total pack years: 19.00    Types: Cigarettes    Start date: 22    Quit date: 02/11/2004    Years since quitting: 18.4    Passive exposure: Past   Smokeless tobacco: Never  Vaping Use   Vaping Use: Never used  Substance and Sexual Activity   Alcohol use: Yes    Comment: RARE Wine   Drug use: Not Currently    Types: Marijuana    Comment: "only in my teens"   Sexual activity: Yes    Birth control/protection: Surgical    Comment: Hysterectomy

## 2022-07-05 ENCOUNTER — Ambulatory Visit (INDEPENDENT_AMBULATORY_CARE_PROVIDER_SITE_OTHER): Payer: Commercial Managed Care - HMO | Admitting: Family Medicine

## 2022-07-05 ENCOUNTER — Encounter: Payer: Self-pay | Admitting: Family Medicine

## 2022-07-05 VITALS — BP 124/80 | HR 97 | Temp 98.0°F

## 2022-07-05 DIAGNOSIS — L97429 Non-pressure chronic ulcer of left heel and midfoot with unspecified severity: Secondary | ICD-10-CM

## 2022-07-05 DIAGNOSIS — E11621 Type 2 diabetes mellitus with foot ulcer: Secondary | ICD-10-CM

## 2022-07-05 LAB — CBC
HCT: 34.7 % — ABNORMAL LOW (ref 36.0–46.0)
Hemoglobin: 9.8 g/dL — ABNORMAL LOW (ref 12.0–15.0)
MCHC: 28.3 g/dL — ABNORMAL LOW (ref 30.0–36.0)
MCV: 65.3 fl — ABNORMAL LOW (ref 78.0–100.0)
Platelets: 317 10*3/uL (ref 150.0–400.0)
RBC: 5.32 Mil/uL — ABNORMAL HIGH (ref 3.87–5.11)
RDW: 21.4 % — ABNORMAL HIGH (ref 11.5–15.5)
WBC: 7.4 10*3/uL (ref 4.0–10.5)

## 2022-07-05 LAB — BASIC METABOLIC PANEL
BUN: 11 mg/dL (ref 6–23)
CO2: 30 mEq/L (ref 19–32)
Calcium: 9.5 mg/dL (ref 8.4–10.5)
Chloride: 97 mEq/L (ref 96–112)
Creatinine, Ser: 0.66 mg/dL (ref 0.40–1.20)
GFR: 94.95 mL/min (ref >60.00)
Glucose, Bld: 224 mg/dL — ABNORMAL HIGH (ref 70–99)
Potassium: 3.9 mEq/L (ref 3.5–5.1)
Sodium: 139 mEq/L (ref 135–145)

## 2022-07-05 LAB — SEDIMENTATION RATE: Sed Rate: 60 mm/hr — ABNORMAL HIGH (ref 0–30)

## 2022-07-05 NOTE — Patient Instructions (Signed)
If you do not hear anything about your referral in the next 1-2 days, call our office and ask for an update.  Give [place] 2-3 business days to get the results of your labs back.   Let us know if you need anything.

## 2022-07-05 NOTE — Progress Notes (Signed)
Chief Complaint  Patient presents with   Hospitalization Follow-up    Subjective: Patient is a 61 y.o. female here for hospital follow-up.  She is here with her spouse.  I saw the patient earlier in the month for a foot ulcer.  Sed rate was very high and she was sent to the hospital for IV antibiotics.  She is in the middle of the 30-day course of doxycycline and Augmentin.  She has been compliant with her medication with no adverse effects.  She will sometimes get yeast infections from antibiotics but has Diflucan at home.  She has been dressing her L foot wound twice daily.  She is still having some pain.  Denies any fevers or foul drainage.  She has been using dermawound which she feels has been helpful.  She is not following with the wound care team.  She is to follow-up with orthopedic surgery team tomorrow.  Most recently they did recommend amputation.  She is not keen to have this done.  She has a follow-up with the infectious disease team on Friday.  Past Medical History:  Diagnosis Date   Aortic stenosis    mild AS by echo 02/2021   Asthma    "on daily RX and rescue inhaler" (04/18/2018)   Chronic diastolic CHF (congestive heart failure) ([place]) 09/2015   Chronic lower back pain    Chronic neck pain    Chronic pain syndrome    Fentanyl Patch   Colon polyps    Coronary artery disease    cath with normal LM, 30% LAD, 85% mid RCA and 95% distal RCA s/p PCI of the mid to distal RCA and now on DAPT with ASA and Ticagrelor.     Eczema    Excessive daytime sleepiness 11/26/2015   Fibromyalgia    Gallstones    GERD (gastroesophageal reflux disease)    Heart murmur    "noted for the 1st time on 04/18/2018"   History of blood transfusion 07/2010   "S/P oophorectomy"   History of gout    History of hiatal hernia 1980s   "gone now" (04/18/2018)   Hyperlipidemia    Hypertension    takes Metoprolol and Enalapril daily   Hypothyroidism    takes Synthroid daily   IBS (irritable bowel  syndrome)    Migraine    "nothing in the 2000s" (04/18/2018)   Mixed connective tissue disease ([place])    NAFLD (nonalcoholic fatty liver disease)    Pneumonia    "several times" (04/18/2018)   PVC's (premature ventricular contractions)    noted on event monitor 02/2021   Rheumatoid arthritis ([place])    "hands, elbows, shoulders, probably knees" (04/18/2018)   Scoliosis    Sjogren's syndrome ([place])    Sleep apnea    mild - does not use cpap   Spondylosis    Type II diabetes mellitus ([place])    takes Metformin and Hum R daily (04/18/2018)   Walker as ambulation aid    also uses wheelchair    Objective: BP 124/80 (BP Location: Left Arm, Patient Position: Sitting, Cuff Size: Normal)   Pulse 97   Temp 98 F (36.7 C) (Oral)   SpO2 97%  General: Awake, appears stated age Skin: See below, no foul odor.  Lungs: No accessory muscle use Psych: Age appropriate judgment and insight, normal affect and mood     Assessment and Plan: Diabetic ulcer of left midfoot associated with type 2 diabetes mellitus, unspecified ulcer stage ([place]) - Plan: Ambulatory referral  to Wound Clinic, CBC, Sedimentation rate, Basic metabolic panel  Will ck labs today.  She has a follow-up with orthopedic surgery team tomorrow.  I am very curious to see what infectious disease says after seeing her this coming Friday.  We will defer to these 2 specialties for final say whether an amputation is required or not.  I will move forward as if she is not going to have it done and refer to the wound care team.  I would like her in the next week if possible. The patient and her spouse voiced understanding and agreement to the plan.  I spent 30 minutes with the patient and her spouse discussing the above plan in addition to reviewing her chart and recent hospitalization on the same day of the visit.  [place], DO 07/05/22  12:08 PM

## 2022-07-06 ENCOUNTER — Ambulatory Visit: Payer: Commercial Managed Care - HMO | Admitting: Orthopedic Surgery

## 2022-07-07 ENCOUNTER — Encounter: Payer: Self-pay | Admitting: Infectious Diseases

## 2022-07-07 ENCOUNTER — Other Ambulatory Visit: Payer: Self-pay

## 2022-07-07 ENCOUNTER — Ambulatory Visit: Payer: Commercial Managed Care - HMO | Admitting: Infectious Diseases

## 2022-07-07 VITALS — BP 133/75 | HR 85 | Temp 97.7°F

## 2022-07-07 DIAGNOSIS — M869 Osteomyelitis, unspecified: Secondary | ICD-10-CM | POA: Diagnosis not present

## 2022-07-07 DIAGNOSIS — E08621 Diabetes mellitus due to underlying condition with foot ulcer: Secondary | ICD-10-CM

## 2022-07-07 DIAGNOSIS — L97423 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: Secondary | ICD-10-CM | POA: Diagnosis not present

## 2022-07-07 DIAGNOSIS — Z79899 Other long term (current) drug therapy: Secondary | ICD-10-CM | POA: Insufficient documentation

## 2022-07-07 MED ORDER — AMOXICILLIN-POT CLAVULANATE 875-125 MG PO TABS: 1.0000 | ORAL_TABLET | Freq: Two times a day (BID) | ORAL | 0 refills | Status: AC

## 2022-07-07 MED ORDER — DOXYCYCLINE HYCLATE 100 MG PO TABS: 100.0000 mg | ORAL_TABLET | Freq: Two times a day (BID) | ORAL | 0 refills | Status: AC

## 2022-07-07 NOTE — Progress Notes (Signed)
Patient Active Problem List   Diagnosis Date Noted   Diabetic ulcer of left midfoot associated with diabetes mellitus due to underlying condition, with necrosis of muscle (HCC)    Diabetic foot infection (HCC) 06/14/2022   Sacral decubitus ulcer, stage II (HCC) 12/24/2021   Class 3 obesity (HCC) 12/23/2021   Iron deficiency anemia 12/23/2021   Acute on chronic diastolic CHF (congestive heart failure) (HCC) 12/22/2021   Hyperkalemia 12/22/2021   Left leg cellulitis 12/20/2021   Peripheral vascular disease, unspecified (HCC) 10/10/2021   Unilateral AKA, right (HCC) 10/10/2021   Hx of AKA (above knee amputation), right (HCC) 07/22/2021   Dehiscence of amputation stump (HCC)    Acute blood loss anemia 06/24/2021   Postoperative wound infection 06/24/2021   UTI (urinary tract infection) 06/24/2021   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)    Bacteremia due to Enterococcus 05/28/2021   Sepsis (HCC) 05/26/2021   Digestive disorder 05/18/2021   Bilateral lower extremity edema 05/02/2021   Statin myopathy 03/04/2021   Aortic stenosis 03/04/2021   Sjogren's syndrome (HCC) 02/11/2020   Cutaneous abscess of right foot    Statin intolerance 08/16/2019   Gram-negative bacteremia 10/18/2018   Streptococcal bacteremia 10/18/2018   CAD S/P percutaneous coronary angioplasty 04/19/2018   Essential hypertension    Moderate persistent asthma 03/21/2018   NAFLD (nonalcoholic fatty liver disease)    Recurrent cellulitis of lower extremity 01/02/2018   Cellulitis of lower leg 01/02/2018   Chronic diarrhea 05/08/2017   H/O Clostridium difficile infection 05/08/2017   Rectal bleeding 05/08/2017   Hyperbilirubinemia 04/29/2016   Hyponatremia 04/29/2016   Cellulitis of right foot 03/09/2016   Allergic rhinitis due to pollen 02/15/2016   Anaphylactic reaction due to food 02/10/2016   Type 2 diabetes mellitus with hyperglycemia, with long-term current use of  insulin (HCC) 02/10/2016   Gastroesophageal reflux disease without esophagitis 02/10/2016   Atopic eczema 02/10/2016   Cervical nerve root disorder 12/15/2015   Lumbar radiculopathy 12/15/2015   Daytime somnolence 11/26/2015   Venous stasis dermatitis of both lower extremities 02/11/2015   Morbid obesity (HCC) 06/19/2014   Abnormal LFTs 03/26/2014   B-complex deficiency 03/26/2014   Benign essential HTN 03/26/2014   Chronic pain associated with significant psychosocial dysfunction 03/26/2014   Diaphragmatic hernia 03/26/2014   Gastroesophageal reflux disease 03/26/2014   Hammer toe 03/26/2014   H/O neoplasm 03/26/2014   Adaptive colitis 03/26/2014   Deafness, sensorineural 03/26/2014   Fibromyalgia 01/20/2014   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/20/2014   Hyperlipidemia 11/11/2012   Mixed connective tissue disease (HCC) 11/11/2012   Hypothyroidism 11/11/2012   Uncomplicated asthma 11/11/2012   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HCC) 02/20/2012   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   ANA positive 07/17/2011    Patient's Medications  New Prescriptions   No medications on file  Previous Medications   ACETAMINOPHEN (TYLENOL) 500 MG TABLET    Take 500 mg by mouth every 8 (eight) hours as needed for mild pain.   ALBUTEROL (PROVENTIL) (2.5 MG/3ML) 0.083% NEBULIZER SOLUTION    USE 1 VIAL IN NEBULIZER EVERY 4 HOURS AS NEEDED FOR WHEEZING FOR SHORTNESS OF BREATH   AMOXICILLIN-CLAVULANATE (AUGMENTIN) 875-125 MG TABLET    Take 1 tablet by mouth every 12 (twelve) hours.   ASCORBIC ACID (VITAMIN C) 500 MG TABLET    Take 1 tablet (500 mg total) by mouth 2 (two) times daily.   ASPIRIN  EC 81 MG TABLET    Take 1 tablet (81 mg total) by mouth every evening.   AUGMENTED BETAMETHASONE DIPROPIONATE (DIPROLENE AF) 0.05 % CREAM    Apply topically 2 (two) times daily.   DAPAGLIFLOZIN PROPANEDIOL (FARXIGA) 10 MG TABS TABLET    Take 1  tablet (10 mg total) by mouth daily before breakfast.   DICLOFENAC SODIUM (VOLTAREN) 1 % GEL    Apply 2 g topically 4 (four) times daily.   DILT-XR 240 MG 24 HR CAPSULE    Take 1 capsule (240 mg total) by mouth daily.   DOXYCYCLINE (VIBRA-TABS) 100 MG TABLET    Take 1 tablet (100 mg total) by mouth every 12 (twelve) hours.   EPINEPHRINE (EPIPEN 2-PAK) 0.3 MG/0.3 ML IJ SOAJ INJECTION    USE AS DIRECTED FOR SEVERE ALLERGIC REACTION.   FERROUS GLUCONATE (FERGON) 324 MG TABLET    Take 1 tablet (324 mg total) by mouth daily with breakfast.   FLUTICASONE (FLONASE) 50 MCG/ACT NASAL SPRAY    USE 2 SPRAY(S) IN EACH NOSTRIL ONCE DAILY AS NEEDED FOR ALLERGIES OR  RHINITIS   FUROSEMIDE (LASIX) 20 MG TABLET    Take 2 tablets by mouth once daily   GABAPENTIN (NEURONTIN) 400 MG CAPSULE    TAKE 1 CAPSULE BY MOUTH IN THE MORNING AND 1 AT NOON AND 2 AT BEDTIME   GLUCOMANNAN PO    Take 1,995 mg by mouth daily.   GLUCOSE BLOOD TEST STRIP    1 each by Other route 4 (four) times daily. Contour Next strips   GUAIFENESIN (MUCINEX) 600 MG 12 HR TABLET    Take 600 mg by mouth 2 (two) times daily as needed for cough or to loosen phlegm.   HYDROCORTISONE CREAM 1 %    Apply 1 application topically 3 (three) times daily as needed for itching (minor skin irritation).   HYDROXYCHLOROQUINE (PLAQUENIL) 200 MG TABLET    Take 200mg  by mouth twice daily Monday through Friday only. None on Saturday or Sunday.   INSULIN REGULAR HUMAN CONCENTRATED (HUMULIN R) 500 UNIT/ML INJECTION    INJECT 0.08-0.3 MLS (40-150 UNITS TOTAL) INTO THE SKIN THREE TIMES DAILY WITH MEALS   INSULIN SYRINGE 1CC/29G (B-D INSULIN SYRINGE) 29G X 1/2" 1 ML MISC    Use three times daily with insulin.  DX E11.9   ISOSORBIDE MONONITRATE (IMDUR) 30 MG 24 HR TABLET    Take 1 tablet by mouth once daily   LEVOCETIRIZINE (XYZAL) 5 MG TABLET    TAKE 1 TABLET BY MOUTH ONCE DAILY IN THE EVENING   LEVOTHYROXINE (SYNTHROID) 200 MCG TABLET    TAKE 1 TABLET BY MOUTH ONCE DAILY  BEFORE BREAKFAST   LIDOCAINE (LIDODERM) 5 %    Place 1 patch onto the skin every 12 (twelve) hours. Remove & Discard patch within 12 hours or as directed   METFORMIN (GLUCOPHAGE-XR) 500 MG 24 HR TABLET    Take 2 tablets by mouth twice daily   MULTIPLE VITAMINS-MINERALS (WOMENS 50+ MULTI VITAMIN PO)    Take 1 tablet by mouth daily.   MUPIROCIN OINTMENT (BACTROBAN) 2 %    Apply 1 application topically 2 (two) times daily as needed (wound care).   NALOXONE (NARCAN) NASAL SPRAY 4 MG/0.1 ML    Place 1 spray into the nose as needed (opoid overdose).   NITROGLYCERIN (NITROSTAT) 0.4 MG SL TABLET    DISSOLVE ONE TABLET UNDER THE TONGUE EVERY 5 MINUTES AS NEEDED FOR CHEST PAIN.  DO NOT EXCEED A TOTAL  OF 3 DOSES IN 15 MINUTES   NYSTATIN (MYCOSTATIN/NYSTOP) POWDER    Apply 1 Application topically 3 (three) times daily. RLE   OMEPRAZOLE (PRILOSEC) 40 MG CAPSULE    Take 1 capsule by mouth once daily   OXYCODONE HCL 20 MG TABS    Take 1 tablet (20 mg total) by mouth every 8 (eight) hours as needed.   OXYMETAZOLINE (AFRIN) 0.05 % NASAL SPRAY    Place 2 sprays into both nostrils 2 (two) times daily as needed for congestion.   POLYETHYL GLYCOL-PROPYL GLYCOL (SYSTANE) 0.4-0.3 % SOLN    Place 1-2 drops into both eyes in the morning.   PRAMIPEXOLE (MIRAPEX) 1 MG TABLET    TAKE 1 TABLET BY MOUTH THREE TIMES DAILY AS NEEDED AND 1/2 (ONE-HALF) TABLET TWICE DAILY AS NEEDED FOR  BREAKTHROUGH  RESTLESS  LEG   PREGABALIN (LYRICA) 50 MG CAPSULE    Take 1 capsule (50 mg total) by mouth 3 (three) times daily.   PROMETHAZINE (PHENERGAN) 25 MG TABLET    Take 1 tablet (25 mg total) by mouth every 8 (eight) hours as needed for vomiting or nausea.   SACCHAROMYCES BOULARDII (FLORASTOR) 250 MG CAPSULE    Take 250 mg by mouth 3 (three) times daily.   VENTOLIN HFA 108 (90 BASE) MCG/ACT INHALER    INHALE 2 PUFFS BY MOUTH EVERY 4 HOURS AS NEEDED FOR WHEEZING AND  FOR  SHORTNESS  OF  BREATH   VITAMIN D, ERGOCALCIFEROL, (DRISDOL) 1.25 MG  (50000 UNIT) CAPS CAPSULE    TAKE 1 CAPSULE BY MOUTH ONCE A WEEK **TAKE  ON  FRIDAY**   WOUND DRESSINGS (ELTA DERMAL WOUND DRESSING EX)    Apply topically.  Modified Medications   No medications on file  Discontinued Medications   No medications on file    Subjective: Here for HFU for follow up for Left DFU and osteomyelitis. Accompanied by her husband.  Patient was discharged on 10/10 with a 1 month supply of doxycycline and augmentin as she refused amputation. States she is taking doxycyline and augmentin as instructed without missing doses. Denies any fevers, chills and sweats. She always has nausea but denies any vomiting, abdominal cramps but has some loose stool- non concerning for C diff. Says Left leg os always red. Denies any increased warmth, tenderness or increased swelling. Denies any drainage from the left foot ulcer and she thinks the wound might have looked better compared to when she was hospitalized.   Seen by Dr Lajoyce Corners 10.19 where she was not willing for amputation once again. Seen by her PCP 10/25 and was referred to wound care as patient has deferred amputation at this time. She was also seen by PMR 10/17 for chronic pain management.   Review of Systems: all systems reviewed with pertinent positives and negatives as listed above   Past Medical History:  Diagnosis Date   Aortic stenosis    mild AS by echo 02/2021   Asthma    "on daily RX and rescue inhaler" (04/18/2018)   Chronic diastolic CHF (congestive heart failure) (HCC) 09/2015   Chronic lower back pain    Chronic neck pain    Chronic pain syndrome    Fentanyl Patch   Colon polyps    Coronary artery disease    cath with normal LM, 30% LAD, 85% mid RCA and 95% distal RCA s/p PCI of the mid to distal RCA and now on DAPT with ASA and Ticagrelor.     Eczema  Excessive daytime sleepiness 11/26/2015   Fibromyalgia    Gallstones    GERD (gastroesophageal reflux disease)    Heart murmur    "noted for the 1st time on  04/18/2018"   History of blood transfusion 07/2010   "S/P oophorectomy"   History of gout    History of hiatal hernia 1980s   "gone now" (04/18/2018)   Hyperlipidemia    Hypertension    takes Metoprolol and Enalapril daily   Hypothyroidism    takes Synthroid daily   IBS (irritable bowel syndrome)    Migraine    "nothing in the 2000s" (04/18/2018)   Mixed connective tissue disease (HCC)    NAFLD (nonalcoholic fatty liver disease)    Pneumonia    "several times" (04/18/2018)   PVC's (premature ventricular contractions)    noted on event monitor 02/2021   Rheumatoid arthritis (HCC)    "hands, elbows, shoulders, probably knees" (04/18/2018)   Scoliosis    Sjogren's syndrome (HCC)    Sleep apnea    mild - does not use cpap   Spondylosis    Type II diabetes mellitus (HCC)    takes Metformin and Hum R daily (04/18/2018)   Walker as ambulation aid    also uses wheelchair   Past Surgical History:  Procedure Laterality Date   ABDOMINAL HYSTERECTOMY  06/2005   "w/right ovariy"   AMPUTATION Right 01/28/2020    RIGHT FOURTH AND FIFTH RAY AMPUTATION   AMPUTATION Right 01/28/2020   Procedure: RIGHT FOURTH AND FIFTH RAY AMPUTATION,;  Surgeon: [name], MD;  Location: [facility] OR;  Service: Orthopedics;  Laterality: Right;   AMPUTATION Right 06/01/2021   Procedure: RIGHT BELOW KNEE AMPUTATION;  Surgeon: [name], MD;  Location: [facility] OR;  Service: Orthopedics;  Laterality: Right;   AMPUTATION Right 07/22/2021   Procedure: RIGHT ABOVE KNEE AMPUTATION;  Surgeon: [name], MD;  Location: [facility] OR;  Service: Orthopedics;  Laterality: Right;   APPENDECTOMY     BREAST BIOPSY Bilateral    9 total (04/18/2018)   CORONARY ANGIOPLASTY WITH STENT PLACEMENT  10/01/2015   normal LM, 30% LAD, 85% mid RCA and 95% distal RCA s/p PCI of the mid to distal RCA and now on DAPT with ASA and Ticagrelor.     CORONARY STENT INTERVENTION Right 04/18/2018   Procedure: CORONARY STENT INTERVENTION;  Surgeon: [name], MD;  Location: [facility] INVASIVE CV LAB;  Service: Cardiovascular;  Laterality: Right;   DILATION AND CURETTAGE OF UTERUS     FRACTURE SURGERY     LAPAROSCOPIC CHOLECYSTECTOMY     LEFT HEART CATH AND CORONARY ANGIOGRAPHY N/A 04/18/2018   Procedure: LEFT HEART CATH AND CORONARY ANGIOGRAPHY;  Surgeon: [name], MD;  Location: [facility];  Service: Cardiovascular;  Laterality: N/A;   MUSCLE BIOPSY Left    "leg"   OOPHORECTOMY  07/2010   RADIOLOGY WITH ANESTHESIA N/A 05/25/2016   Procedure: RADIOLOGY WITH ANESTHESIA;  Surgeon: Medication Radiologist, MD;  Location: [facility] OR;  Service: Radiology;  Laterality: N/A;   STUMP REVISION Right 06/29/2021   Procedure: REVISION RIGHT BELOW KNEE AMPUTATION;  Surgeon: [name], MD;  Location: [facility] OR;  Service: Orthopedics;  Laterality: Right;   TEE WITHOUT CARDIOVERSION N/A 06/01/2021   Procedure: TRANSESOPHAGEAL ECHOCARDIOGRAM (TEE);  Surgeon: Sande Rives, MD;  Location: [name] OR;  Service: Cardiovascular;  Laterality: N/A;   TONSILLECTOMY AND ADENOIDECTOMY     WRIST FRACTURE SURGERY Left    "crushed it"  Social History   Tobacco Use   Smoking status: Former    Packs/day: 1.00    Years: 19.00    Total pack years: 19.00    Types: Cigarettes    Start date: 34    Quit date: 02/11/2004    Years since quitting: 18.4    Passive exposure: Past   Smokeless tobacco: Never  Vaping Use   Vaping Use: Never used  Substance Use Topics   Alcohol use: Yes    Comment: RARE Wine   Drug use: Not Currently    Types: Marijuana    Comment: "only in my teens"    Family History  Problem Relation Age of Onset   CAD Mother    Hypertension Mother    Heart attack Mother    Stroke Mother    CAD Father    Heart attack Father    Allergic rhinitis Father    Asthma Father    Hypertension Brother    Hypertension Brother    Pancreatic cancer Paternal Aunt    Breast cancer Paternal Aunt    Lung cancer Paternal Aunt     Allergic rhinitis Son    Asthma Son     Allergies  Allergen Reactions   Fish Allergy Anaphylaxis    INCLUDES OMEGA 3 OILS   Fish Oil Anaphylaxis and Swelling    THROAT SWELLS INCLUDES FISH AS A CLASS   Omega-3 Fatty Acids Swelling    Throat swelling  Has tolerated Glucerna nutritional drinks    Crestor [Rosuvastatin] Other (See Comments)    Extreme joint pain, to this & other statins   Gabapentin Anxiety    Anxiety on high doses   Lovastatin Other (See Comments)    EXTREME JOINT PAIN   Metronidazole Nausea Only and Swelling    Headache and shakes Nausea and vomiting Pt tolerating IV 06/14/22   Red Yeast Rice [Cholestin] Other (See Comments)    Muscle pain and severe joint pain.    Rotigotine Swelling    (Neupro) Extreme edema    Atrovent Hfa [Ipratropium Bromide Hfa] Other (See Comments)    wheezing   Iodine Swelling   Morphine    Other Other (See Comments)    Surgical Staples causes redness/infection per patient.   Pepto-Bismol [Bismuth Subsalicylate] Nausea And Vomiting    Extreme vomiting   Victoza [Liraglutide]     Unsure of reaction    Enalapril Cough   Suprep [Na Sulfate-K Sulfate-Mg Sulf] Nausea And Vomiting   Tape Other (See Comments)    Electrodes causes skin breakdown    Health Maintenance  Topic Date Due   PAP SMEAR-Modifier  Never done   MAMMOGRAM  Never done   INFLUENZA VACCINE  04/11/2022   COVID-19 Vaccine (1) 11/24/2022 (Originally 07/02/1966)   Zoster Vaccines- Shingrix (1 of 2) 11/24/2022 (Originally [birth year]02/1980)   Diabetic kidney evaluation - Urine ACR  05/27/2023   Diabetic kidney evaluation - GFR measurement  07/06/2023   COLONOSCOPY (Pts 45-24yrs Insurance coverage will need to be confirmed)  09/12/2023   TETANUS/TDAP  02/26/2024   Hepatitis C Screening  Completed   HIV Screening  Completed   HPV VACCINES  Aged Out   FOOT EXAM  Discontinued   HEMOGLOBIN A1C  Discontinued   OPHTHALMOLOGY EXAM  Discontinued    Objective:  Vitals:    07/07/22 0914  BP: 133/75  Pulse: 85  Temp: 97.7 F (36.5 C)  TempSrc: Temporal   There is no height or weight on file to calculate BMI.  Physical Exam Constitutional:      Appearance: Normal appearance. Obese  HENT:     Head: Normocephalic and atraumatic.      Mouth: Mucous membranes are moist.  Eyes:    Conjunctiva/sclera: Conjunctivae normal.     Pupils:   Cardiovascular:     Rate and Rhythm: Normal rate and regular rhythm.     Heart sounds:  Pulmonary:     Effort: Pulmonary effort is normal.     Breath sounds: Normal breath sounds.   Abdominal:     General: Non distended     Palpations: soft.   Musculoskeletal:        General: on a wheel chair. RT AKA with no concerns   Left foot - left lateral foot ulcer wih no active drainage or active signs of infection. DP palpable.  Left leg - erythematous ( chronic per patient), swollen, no tenderness, no fluctuance of crepitus. Does not appear to have cellulitis  ROM of left knee and ankle good.        Skin:    General: Skin is warm and dry.     Comments:  Neurological:     General: grossly non focal     Mental Status: awake, alert and oriented to person, place, and time.   Psychiatric:        Mood and Affect: Mood normal.   Lab Results Lab Results  Component Value Date   WBC 7.4 07/05/2022   HGB 9.8 Repeated and verified X2. (L) 07/05/2022   HCT 34.7 Repeated and verified X2. (L) 07/05/2022   MCV 65.3 (L) 07/05/2022   PLT 317.0 07/05/2022    Lab Results  Component Value Date   CREATININE 0.66 07/05/2022   BUN 11 07/05/2022   NA 139 07/05/2022   K 3.9 07/05/2022   CL 97 07/05/2022   CO2 30 07/05/2022    Lab Results  Component Value Date   ALT 22 06/14/2022   AST 32 06/14/2022   ALKPHOS 69 06/14/2022   BILITOT 0.3 06/14/2022    Lab Results  Component Value Date   CHOL 158 05/26/2022   HDL 50 05/26/2022   LDLCALC 83 05/26/2022   LDLDIRECT 121.0 10/27/2020   TRIG 158 (H) 05/26/2022    CHOLHDL 3.2 05/26/2022   No results found for: "LABRPR", "RPRTITER" No results found for: "HIV1RNAQUANT", "HIV1RNAVL", "CD4TABS"  Assessment/Plan Diabetic insensate neuropathy with necrotic ulcer in the lateral aspect of left foot extending down to the bone although MRI was negative for osteomyelitis.  Poorly controlled DM with neuropathy  Morbid obesity  H/o C diff in 2017   Complete 6 weeks of doxycyline and augmentin. EOT 07/25/22. Would not continue past completion of 6 weeks given unlikely to make a difference. I have discussed with patient that the ulcer are less likely to heal by abtx alone and reconsider amputation.  Labs from 10/25 reviewed and will not do labs today  Fu with wound care  BG control  Fu in 3-4 weeks   I have personally spent 45 minutes involved in face-to-face and non-face-to-face activities for this patient on the day of the visit. Professional time spent includes the following activities: Preparing to see the patient (review of tests), Obtaining and/or reviewing separately obtained history (admission/discharge record), Performing a medically appropriate examination and/or evaluation , Ordering medications/tests/procedures, referring and communicating with other health care professionals, Documenting clinical information in the EMR, Independently interpreting results (not separately reported), Communicating results to the patient/family/caregiver, Counseling and educating the patient/family/caregiver and  Care coordination (not separately reported).   Victoriano Lain, MD [facility] 07/07/2022, 9:53 AM

## 2022-07-12 ENCOUNTER — Telehealth: Payer: Self-pay

## 2022-07-12 NOTE — Telephone Encounter (Signed)
Patient called stating when husband picked up her doxy and Augmentin the pharmacist advised her that the medication "cancel" each other out and may not work. Patient is asking if she should be taking the the two medications. Please advise.  [name] T Brooks Sailors

## 2022-07-13 ENCOUNTER — Other Ambulatory Visit: Payer: Self-pay | Admitting: Family Medicine

## 2022-07-13 NOTE — Telephone Encounter (Signed)
Do you want her to continue both medications/

## 2022-07-13 NOTE — Telephone Encounter (Signed)
Patient informed to continue taking both antibiotics.  [name]

## 2022-07-19 ENCOUNTER — Encounter (HOSPITAL_BASED_OUTPATIENT_CLINIC_OR_DEPARTMENT_OTHER): Payer: Commercial Managed Care - HMO | Attending: General Surgery | Admitting: General Surgery

## 2022-07-19 ENCOUNTER — Encounter: Payer: 59 | Admitting: Pharmacist

## 2022-07-19 DIAGNOSIS — I890 Lymphedema, not elsewhere classified: Secondary | ICD-10-CM | POA: Diagnosis not present

## 2022-07-19 DIAGNOSIS — I5032 Chronic diastolic (congestive) heart failure: Secondary | ICD-10-CM | POA: Diagnosis not present

## 2022-07-19 DIAGNOSIS — M797 Fibromyalgia: Secondary | ICD-10-CM | POA: Insufficient documentation

## 2022-07-19 DIAGNOSIS — Z89611 Acquired absence of right leg above knee: Secondary | ICD-10-CM | POA: Diagnosis not present

## 2022-07-19 DIAGNOSIS — E1151 Type 2 diabetes mellitus with diabetic peripheral angiopathy without gangrene: Secondary | ICD-10-CM | POA: Diagnosis not present

## 2022-07-19 DIAGNOSIS — M069 Rheumatoid arthritis, unspecified: Secondary | ICD-10-CM | POA: Insufficient documentation

## 2022-07-19 DIAGNOSIS — L299 Pruritus, unspecified: Secondary | ICD-10-CM | POA: Insufficient documentation

## 2022-07-19 DIAGNOSIS — E11621 Type 2 diabetes mellitus with foot ulcer: Secondary | ICD-10-CM | POA: Diagnosis not present

## 2022-07-19 DIAGNOSIS — E039 Hypothyroidism, unspecified: Secondary | ICD-10-CM | POA: Diagnosis not present

## 2022-07-19 DIAGNOSIS — E1140 Type 2 diabetes mellitus with diabetic neuropathy, unspecified: Secondary | ICD-10-CM | POA: Diagnosis not present

## 2022-07-19 DIAGNOSIS — Z87891 Personal history of nicotine dependence: Secondary | ICD-10-CM | POA: Diagnosis not present

## 2022-07-19 DIAGNOSIS — J45909 Unspecified asthma, uncomplicated: Secondary | ICD-10-CM | POA: Insufficient documentation

## 2022-07-19 DIAGNOSIS — M3500 Sicca syndrome, unspecified: Secondary | ICD-10-CM | POA: Diagnosis not present

## 2022-07-19 DIAGNOSIS — Z6841 Body Mass Index (BMI) 40.0 and over, adult: Secondary | ICD-10-CM | POA: Insufficient documentation

## 2022-07-19 DIAGNOSIS — Z955 Presence of coronary angioplasty implant and graft: Secondary | ICD-10-CM | POA: Insufficient documentation

## 2022-07-19 DIAGNOSIS — L97423 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: Secondary | ICD-10-CM | POA: Diagnosis present

## 2022-07-19 DIAGNOSIS — I110 Hypertensive heart disease with heart failure: Secondary | ICD-10-CM | POA: Diagnosis not present

## 2022-07-19 DIAGNOSIS — M869 Osteomyelitis, unspecified: Secondary | ICD-10-CM | POA: Diagnosis not present

## 2022-07-19 NOTE — Progress Notes (Signed)
Moure, Brandin [name] ([id number]) [id number] Page 1 of 4 Visit Report for 07/19/2022 Abuse Risk Screen Details Patient Name: Date of Service: [name], [name] 07/19/2022 8:00 A [name] Medical Record Number: [id number] Patient Account Number: [id number] Date of Birth/Sex: Treating RN: 11-14-1960 (61 y.o. [name] Primary Care Ames Hoban: [name] Other Clinician: Referring Jamel Dunton: Treating Breta Demedeiros/Extender: Louann Sjogren in Treatment: 0 Abuse Risk Screen Items Answer ABUSE RISK SCREEN: Has anyone close to you tried to hurt or harm you recentlyo No Do you feel uncomfortable with anyone in your familyo No Has anyone forced you do things that you didnt want to doo No Electronic Signature(s) Signed: 07/19/2022 10:25:58 AM By: Dellie Catholic RN Entered By: Dellie Catholic on 07/19/2022 08:28:26 -------------------------------------------------------------------------------- Activities of Daily Living Details Patient Name: Date of Service: [name], [name] 07/19/2022 8:00 A [name] Medical Record Number: [id number] Patient Account Number: [id number] Date of Birth/Sex: Treating RN: [birth year] (61 y.o. [name] Primary Care Sherryn Pollino: [name] Other Clinician: Referring Daana Petrasek: Treating Magdaleno Lortie/Extender: Louann Sjogren in Treatment: 0 Activities of Daily Living Items Answer Activities of Daily Living (Please select one for each item) Drive Automobile Not Able T Medications ake Completely Able Use T elephone Completely Able Care for Appearance Need Assistance Use T oilet Need Assistance Bath / Shower Need Assistance Dress Self Need Assistance Feed Self Completely Able Walk Need Assistance Get In / Out Bed Need Assistance Housework Not Able Prepare Meals Need Assistance Handle Money Completely Able Shop for Self Need Assistance Electronic Signature(s) Signed:  07/19/2022 10:25:58 AM By: Dellie Catholic RN Entered By: Dellie Catholic on 07/19/2022 08:29:06 [name], [name] ([id number]) [id number] Page 2 of 4 -------------------------------------------------------------------------------- Education Screening Details Patient Name: Date of Service: [name], [name] 07/19/2022 8:00 A [name] Medical Record Number: [id number] Patient Account Number: [id number] Date of Birth/Sex: Treating RN: [birth year] (61 y.o. [name] Primary Care Jolanta Cabeza: [name] Other Clinician: Referring Glennette Galster: Treating Starleen Trussell/Extender: Louann Sjogren in Treatment: 0 Learning Preferences/Education Level/Primary Language Learning Preference: Explanation, Demonstration, Printed Material Highest Education Level: College or Above Preferred Language: English Cognitive Barrier Language Barrier: No Translator Needed: No Memory Deficit: No Emotional Barrier: No Cultural/Religious Beliefs Affecting Medical Care: No Physical Barrier Impaired Vision: No Impaired Hearing: No Decreased Hand dexterity: No Knowledge/Comprehension Knowledge Level: High Comprehension Level: High Ability to understand written instructions: High Ability to understand verbal instructions: High Motivation Anxiety Level: Calm Cooperation: Cooperative Education Importance: Acknowledges Need Interest in Health Problems: Asks Questions Perception: Coherent Willingness to Engage in Self-Management High Activities: Readiness to Engage in Self-Management High Activities: Electronic Signature(s) Signed: 07/19/2022 10:25:58 AM By: Dellie Catholic RN Entered By: Dellie Catholic on 07/19/2022 08:29:52 -------------------------------------------------------------------------------- Fall Risk Assessment Details Patient Name: Date of Service: [name], [name] 07/19/2022 8:00 A [name] Medical Record Number: [id number] Patient Account  Number: [id number] Date of Birth/Sex: Treating RN: [birth year] (61 y.o. [name] Primary Care Zymarion Favorite: [name] Other Clinician: Referring Ryne Mctigue: Treating Breydan Shillingburg/Extender: Louann Sjogren in Treatment: 0 Fall Risk Assessment Items Have you had 2 or more falls in the last 12 monthso 0 No Have you had any fall that resulted in injury in the last 12 monthso 0 Yes [name], [name] ([id number]) [id number] Page 3 of 4 FALLS RISK SCREEN History of falling - immediate or within 3 months 0 No Secondary diagnosis (Do you have 2 or more medical diagnoseso) 0 No Ambulatory aid None/bed rest/wheelchair/nurse 0  No Crutches/cane/walker 0 No Furniture 0 No Intravenous therapy Access/Saline/Heparin Lock 0 No Gait/Transferring Normal/ bed rest/ wheelchair 0 No Weak (short steps with or without shuffle, stooped but able to lift head while walking, may seek 0 No support from furniture) Impaired (short steps with shuffle, may have difficulty arising from chair, head down, impaired 0 No balance) Mental Status Oriented to own ability 0 No Notes Pt. stated that she fell about 6 months ago Electronic Signature(s) Signed: 07/19/2022 10:25:58 AM By: Karie Schwalbe RN Entered By: Karie Schwalbe on 07/19/2022 08:30:23 -------------------------------------------------------------------------------- Foot Assessment Details Patient Name: Date of Service: [name], [name] 07/19/2022 8:00 A [name] Medical Record Number: [id number] Patient Account Number: [id number] Date of Birth/Sex: Treating RN: 12-Dec-1960 (61 y.o. 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 Site Locations + = Sensation present, - =  Sensation absent, C = Callus, U = Ulcer R = Redness, W = Warmth, [name] = Maceration, PU = Pre-ulcerative lesion F = Fissure, S = Swelling, D = Dryness Assessment Right: Left: Other Deformity: No Prior Foot Ulcer: No Prior Amputation: No Charcot Joint: No [name], [name] ( ) [id number] Page 4 of 4 Ambulatory Status: Non-ambulatory Assistance Device: Wheelchair Gait: 11-04-1992) Signed: 07/19/2022 10:25:58 AM By: 13/04/2022 RN Entered By: Karie Schwalbe on 07/19/2022 08:33:30 -------------------------------------------------------------------------------- Nutrition Risk Screening Details Patient Name: Date of Service: [name], [name] 07/19/2022 8:00 A [name] Medical Record Number: 13/04/2022 Patient Account Number: [id number] Date of Birth/Sex: Treating RN: February 11, 1961 (61 y.o. 77 Primary Care Dondra Rhett: [name] Other Clinician: Referring Wanetta Funderburke: Treating Makala Fetterolf/Extender: [name] in Treatment: 0 Height (in): 62 Weight (lbs): 284 Body Mass Index (BMI): 51.9 Nutrition Risk Screening Items Score Screening NUTRITION RISK SCREEN: I have an illness or condition that made me change the kind and/or amount of food I eat 0 No I eat fewer than two meals per day 0 No I eat few fruits and vegetables, or milk products 0 No I have three or more drinks of beer, liquor or wine almost every day 0 No I have tooth or mouth problems that make it hard for me to eat 0 No I don't always have enough money to buy the food I need 0 No I eat alone most of the time 0 No I take three or more different prescribed or over-the-counter drugs a day 0 No Without wanting to, I have lost or gained 10 pounds in the last six months 0 No I am not always physically able to shop, cook and/or feed myself 0 No Nutrition Protocols Good Risk Protocol 0 No interventions needed Moderate Risk  Protocol High Risk Proctocol Risk Level: Good Risk Score: 0 Electronic Signature(s) Signed: 07/19/2022 10:25:58 AM By: 13/04/2022 RN Entered By: Karie Schwalbe on 07/19/2022 08:30:55

## 2022-07-19 NOTE — Progress Notes (Addendum)
Offord, [name] ([id number]) [id number] Page 1 of 11 Visit Report for 07/19/2022 Allergy List Details Patient Name: Date of Service: [name], [name]. 07/19/2022 8:00 A [name] Medical Record Number: [id number] Patient Account Number: [id number] Date of Birth/Sex: Treating RN: July 09, 1961 (61 y.o. [name] Primary Care Wilhemenia Camba: [name] Other Clinician: Referring Braison Snoke: Treating Aliou Mealey/Extender: Louann Sjogren in Treatment: 0 Allergies Active Allergies fish oil Fish Containing Products Crestor lovastatin metronidazole Red Yeast Rice ([place] Purpureus) rotigotine Atrovent Suprep Bowel Prep Kit adhesive tape Betadine peanut Severity: Severe Dial soap Reaction: itching Type: Allergen gabapentin Severity: Moderate iodine Severity: Moderate morphine Severity: Severe Suprep Bowel Prep Kit Victoza Severity: Moderate Pepto-Bismol Severity: Moderate Allergy Notes Electronic Signature(s) Signed: 07/19/2022 10:25:58 AM By: Dellie Catholic RN Entered By: Dellie Catholic on 07/19/2022 08:26:26 Viveros, Chimamanda [name] ([id number]) [id number] Page 2 of 11 -------------------------------------------------------------------------------- Arrival Information Details Patient Name: Date of Service: [name], [name]. 07/19/2022 8:00 A [name] Medical Record Number: [id number] Patient Account Number: [id number] Date of Birth/Sex: Treating RN: 1961/02/16 (61 y.o. F) Primary Care Fitzhugh Vizcarrondo: [name] Other Clinician: Referring Ulysess Witz: Treating Kerrington Sova/Extender: Louann Sjogren in Treatment: 0 Visit Information Patient Arrived: Wheel Chair Arrival Time: 08:02 Accompanied By: husband Transfer Assistance: Other Patient Identification Verified: Yes Secondary Verification Process Completed: Yes History Since Last Visit All ordered tests and consults were completed:  No Added or deleted any medications: No Any new allergies or adverse reactions: No Had a fall or experienced change in activities of daily living that may affect risk of falls: No Signs or symptoms of abuse/neglect since last visito No Hospitalized since last visit: No Implantable device outside of the clinic excluding cellular tissue based products placed in the center since last visit: No Electronic Signature(s) Signed: 07/19/2022 8:53:47 AM By: [name] Entered By: [name] on 07/19/2022 08:03:53 -------------------------------------------------------------------------------- Clinic Level of Care Assessment Details Patient Name: Date of Service: [name], [name] 07/19/2022 8:00 A [name] Medical Record Number: [id number] Patient Account Number: [id number] Date of Birth/Sex: Treating RN: 01/12/1961 (62 y.o. [name] Primary Care Shabree Tebbetts: [name] Other Clinician: Referring Joanthony Hamza: Treating Samah Lapiana/Extender: Louann Sjogren in Treatment: 0 Clinic Level of Care Assessment Items TOOL 1 Quantity Score X- 1 0 Use when EandM and Procedure is performed on INITIAL visit ASSESSMENTS - Nursing Assessment / Reassessment X- 1 20 General Physical Exam (combine w/ comprehensive assessment (listed just below) when performed on new pt. evals) X- 1 25 Comprehensive Assessment (HX, ROS, Risk Assessments, Wounds Hx, etc.) ASSESSMENTS - Wound and Skin Assessment / Reassessment X- 1 10 Dermatologic / Skin Assessment (not related to wound area) ASSESSMENTS - Ostomy and/or Continence Assessment and Care _0  - 0 Incontinence Assessment and Management _1  - 0 Ostomy Care Assessment and Management (repouching, etc.) PROCESS - Coordination of Care _2  - 0 Simple Patient / Family Education for ongoing care X- 1 20 Complex (extensive) Patient / Family Education for ongoing care X- 1 10 Staff obtains Consents, Records, T Results / Process Orders est X-  1 10 Staff telephones HHA, Nursing Homes / Clarify orders / etc Worst, Jessicamarie [name] ([id number]) [id number] Page 3 of 11 _3  - 0 Routine Transfer to another Facility (non-emergent condition) _4  - 0 Routine Hospital Admission (non-emergent condition) X- 1 15 New Admissions / Biomedical engineer / Ordering NPWT Apligraf, etc. , _5  - 0 Emergency Hospital Admission (emergent condition) PROCESS - Special Needs _6  - 0 Pediatric / Minor Patient Management _7  - 0  Isolation Patient Management X- 1 15 Hearing / Language / Visual special needs _0  - 0 Assessment of Community assistance (transportation, D/C planning, etc.) _1  - 0 Additional assistance / Altered mentation _2  - 0 Support Surface(s) Assessment (bed, cushion, seat, etc.) INTERVENTIONS - Miscellaneous _3  - 0 External ear exam _4  - 0 Patient Transfer (multiple staff / Civil Service fast streamer / Similar devices) _5  - 0 Simple Staple / Suture removal (25 or less) _6  - 0 Complex Staple / Suture removal (26 or more) _7  - 0 Hypo/Hyperglycemic Management (do not check if billed separately) X- 1 15 Ankle / Brachial Index (ABI) - do not check if billed separately Has the patient been seen at the hospital within the last three years: Yes Total Score: 140 Level Of Care: New/Established - Level 4 Electronic Signature(s) Signed: 07/19/2022 10:25:58 AM By: Dellie Catholic RN Entered By: Dellie Catholic on 07/19/2022 10:16:45 -------------------------------------------------------------------------------- Encounter Discharge Information Details Patient Name: Date of Service: [name], [name] 07/19/2022 8:00 A [name] Medical Record Number: [id number] Patient Account Number: [id number] Date of Birth/Sex: Treating RN: [birth year] (61 y.o. [name] Primary Care Braeton Wolgamott: [name] Other Clinician: Referring Edrie Ehrich: Treating Floella Ensz/Extender: Louann Sjogren in Treatment:  0 Encounter Discharge Information Items Post Procedure Vitals Discharge Condition: Stable Temperature (F): 98 Ambulatory Status: Wheelchair Pulse (bpm): 92 Discharge Destination: Home Respiratory Rate (breaths/min): 20 Transportation: Private Auto Blood Pressure (mmHg): 128/71 Accompanied By: spouse Schedule Follow-up Appointment: Yes Clinical Summary of Care: Patient Declined Electronic Signature(s) Signed: 07/19/2022 10:25:58 AM By: Dellie Catholic RN Entered By: Dellie Catholic on 07/19/2022 10:19:17 [name], [name] ([id number]) [id number] Page 4 of 11 -------------------------------------------------------------------------------- Lower Extremity Assessment Details Patient Name: Date of Service: [name], [name] 07/19/2022 8:00 A [name] Medical Record Number: [id number] Patient Account Number: [id number] Date of Birth/Sex: Treating RN: 02-13-1961 (61 y.o. [name] Primary Care Adasyn Mcadams: [name] Other Clinician: Referring Harrison Zetina: Treating Latashia Koch/Extender: Louann Sjogren in Treatment: 0 Edema Assessment Assessed: [Left: No] [Right: No] Edema: [Left: Ye] [Right: s] Calf Left: Right: Point of Measurement: 30 cm From Medial Instep 45.5 cm Ankle Left: Right: Point of Measurement: 9 cm From Medial Instep 24.5 cm Knee To Floor Left: Right: From Medial Instep 37 cm Vascular Assessment Pulses: Dorsalis Pedis Palpable: [Left:Yes] Blood Pressure: Brachial: [Left:128] Ankle: [Left:Dorsalis Pedis: 98 0.77] Electronic Signature(s) Signed: 07/19/2022 10:25:58 AM By: Dellie Catholic RN Entered By: Dellie Catholic on 07/19/2022 08:42:58 -------------------------------------------------------------------------------- Multi Wound Chart Details Patient Name: Date of Service: [name], [name] 07/19/2022 8:00 A [name] Medical Record Number: [id number] Patient Account Number: [id number] Date of Birth/Sex: Treating  RN: August 07, 1961 (61 y.o. 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 Primary Etiology: Extremity Cataracts, Lymphedema, Asthma, Cataracts, Lymphedema, Asthma, N/A Comorbid History: Congestive Heart Failure, Coronary Congestive Heart Failure, Coronary Artery Disease, Hypertension, Artery Disease, Hypertension, Peripheral Arterial Disease, Peripheral Peripheral Arterial Disease, Peripheral Venous Disease, Type II Diabetes, Venous Disease, Type II Diabetes, Rheumatoid Arthritis, Neuropathy Rheumatoid Arthritis, Neuropathy 07/17/2022 05/17/2022 N/A Date Acquired: 0 0 N/A Weeks of Treatment: Open Open N/A Wound Status:  No No N/A Wound Recurrence: 1.4x1.8x0.1 5.5x5.2x0.4 N/A Measurements L x W x D (cm) 1.979 22.462 N/A A (cm) : rea 0.198 8.985 N/A Volume (cm) : Partial Thickness Grade 1 N/A Classification: Medium Medium N/A Exudate A mount: Serosanguineous Serosanguineous N/A Exudate Type: red, brown red, brown N/A Exudate Color: Large (67-100%) Small (1-33%) N/A Granulation A mount: Red Pink N/A Granulation Quality: Small (1-33%) Large (67-100%) N/A Necrotic A mount: Adherent Slough Eschar, Adherent Slough N/A Necrotic Tissue: Fat Layer (Subcutaneous Tissue): Yes Fat Layer (Subcutaneous Tissue): Yes N/A Exposed Structures: Fascia: No Tendon: No Muscle: No Joint:  No Bone: No None None N/A Epithelialization: Debridement - Excisional Debridement - Excisional N/A Debridement: Pre-procedure Verification/Time Out 09:00 09:00 N/A Taken: Lidocaine 5% topical ointment Lidocaine 5% topical ointment N/A Pain Control: Subcutaneous, Slough Muscle, Fat, Slough N/A Tissue Debrided: Skin/Subcutaneous Tissue Skin/Subcutaneous Tissue/Muscle N/A Level: 2.52 28.6 N/A Debridement A (sq cm): rea Curette Blade, Other(Hemostats) N/A Instrument: Large Large N/A Bleeding: Other Topical Anticoagulant Other Topical Anticoagulant N/A Hemostasis A chieved: 0 0 N/A Procedural Pain: 0 0 N/A Post Procedural Pain: Procedure was tolerated well Procedure was tolerated well N/A Debridement Treatment Response: 1.4x1.8x0.1 5.5x5.2x0.4 N/A Post Debridement Measurements L x W x D (cm) 0.198 8.985 N/A Post Debridement Volume: (cm) Callus: Yes N/A Periwound Skin Texture: Dry/Scaly: Yes N/A Periwound Skin Moisture: Hemosiderin Staining: Yes N/A Periwound Skin Color: Debridement Debridement N/A Procedures Performed: Treatment Notes Electronic Signature(s) Signed: 07/19/2022 9:34:30 AM By: Fredirick Maudlin MD FACS Entered By: Fredirick Maudlin on 07/19/2022 09:34:29 -------------------------------------------------------------------------------- Multi-Disciplinary Care Plan Details Patient Name: Date of Service: Rosene, [name] 07/19/2022 8:00 A [name] Medical Record Number: [id number] Patient Account Number: [id number] Greaves, Marquette [name] ([id number]) [id number] Page 6 of 11 Date of Birth/Sex: Treating RN: [birth year] (61 y.o. [name] Primary Care Mayana Irigoyen: Other Clinician: [name] Referring Danaija Eskridge: Treating Beckett Maden/Extender: Louann Sjogren in Treatment: 0 Active Inactive Venous Leg Ulcer Nursing Diagnoses: Potential for venous Insuffiency (use before diagnosis confirmed) Goals: Patient  will maintain optimal edema control Date Initiated: 07/19/2022 Target Resolution Date: 09/10/2022 Goal Status: Active Interventions: Assess peripheral edema status every visit. Treatment Activities: Therapeutic compression applied : 07/19/2022 Notes: Electronic Signature(s) Signed: 07/19/2022 10:25:58 AM By: Dellie Catholic RN Entered By: Dellie Catholic on 07/19/2022 09:01:39 -------------------------------------------------------------------------------- Pain Assessment Details Patient Name: Date of Service: Wigal, [name] 07/19/2022 8:00 A [name] Medical Record Number: [id number] Patient Account Number: [id number] Date of Birth/Sex: Treating RN: 19-Jan-1961 (61 y.o. [name] Primary Care Khaleem Burchill: [name] Other Clinician: Referring Adeel Guiffre: Treating Keithen Capo/Extender: Louann Sjogren in Treatment: 0 Active Problems Location of Pain Severity and Description of Pain Patient Has Paino Yes Site Locations Pain Location: Generalized Pain With Dressing Change: Yes Duration of the Pain. Constant / Intermittento Constant Rate the pain. Current Pain Level: 9 Worst Pain Level: 10 Least Pain Level: 3 Tolerable Pain Level: 5 Character of Pain Describe the Pain: Difficult to Pinpoint Pain Management and Medication Current Pain Management: [place]parro, Jowanna [name] ([id number]) [id number] Page 7 of 11 Medication: No Cold Application: No Rest: No Massage: No Activity: No T.E.N.S.: No Heat Application: No Leg drop or elevation: No Is the Current Pain Management Adequate: Adequate How does your wound impact your activities of daily livingo Sleep: No Bathing: No Appetite: No Relationship With Others: No Bladder Continence: No Emotions: No Bowel Continence: No Work: No Toileting: No Drive: No Dressing: No Hobbies: No Electronic Signature(s) Signed: 07/19/2022 10:25:58 AM By: Dellie Catholic RN Entered  By:  Dellie Catholic on 07/19/2022 08:48:26 -------------------------------------------------------------------------------- Patient/Caregiver Education Details Patient Name: Date of Service: Darko, [name] 11/8/2023andnbsp8:00 A [name] Medical Record Number: [id number] Patient Account Number: [id number] Date of Birth/Gender: Treating RN: 1960-12-30 (61 y.o. [name] Primary Care Physician: [name] Other Clinician: Referring Physician: Treating Physician/Extender: Louann Sjogren in Treatment: 0 Education Assessment Education Provided To: Patient Education Topics Provided Wound/Skin Impairment: Methods: Explain/Verbal Responses: Return demonstration correctly Electronic Signature(s) Signed: 07/19/2022 10:25:58 AM By: Dellie Catholic RN Entered By: Dellie Catholic on 07/19/2022 10:15:24 -------------------------------------------------------------------------------- Wound Assessment Details Patient Name: Date of Service: Mersereau, [name] 07/19/2022 8:00 A [name] Medical Record Number: [id number] Patient Account Number: [id number] Date of Birth/Sex: Treating RN: 1961-08-30 (61 y.o. [name] Primary Care Gradie Ohm: [name] Other Clinician: Referring Alois Colgan: Treating Cathan Gearin/Extender: Louann Sjogren in Treatment: 0 Wound Status Wound Number: 18 Primary Venous Leg Ulcer Etiology: Wound Location: Left, Anterior Lower Leg Wound Open Wounding Event: Trauma Status: Date Acquired: 07/17/2022 Peale, Senaida [name] ([id number]) [id number] Page 8 of 11 Date Acquired: 07/17/2022 Comorbid Cataracts, Lymphedema, Asthma, Congestive Heart Failure, Weeks Of Treatment: 0 History: Coronary Artery Disease, Hypertension, Peripheral Arterial Disease, Clustered Wound: No Peripheral Venous Disease, Type II Diabetes, Rheumatoid Arthritis, Neuropathy Photos Wound Measurements Length: (cm)  1.4 Width: (cm) 1.8 Depth: (cm) 0.1 Area: (cm) 1.979 Volume: (cm) 0.198 % Reduction in Area: % Reduction in Volume: Epithelialization: None Tunneling: No Undermining: No Wound Description Classification: Partial Thickness Exudate Amount: Medium Exudate Type: Serosanguineous Exudate Color: red, brown Wound Bed Granulation Amount: Large (67-100%) Exposed Structure Granulation Quality: Red Fat Layer (Subcutaneous Tissue) Exposed: Yes Necrotic Amount: Small (1-33%) Necrotic Quality: Adherent Slough Periwound Skin Texture Texture Color No Abnormalities Noted: No No Abnormalities Noted: No Hemosiderin Staining: Yes Moisture No Abnormalities Noted: No Treatment Notes Wound #18 (Lower Leg) Wound Laterality: Left, Anterior Cleanser Soap and Water Discharge Instruction: May shower and wash wound with dial antibacterial soap and water prior to dressing change. Wound Cleanser Discharge Instruction: Cleanse the wound with wound cleanser prior to applying a clean dressing using gauze sponges, not tissue or cotton balls. Byram Ancillary Kit - 15 Day Supply Discharge Instruction: Use supplies as instructed; Kit contains: (15) Saline Bullets; (15) 3x3 Gauze; 15 pr Gloves Peri-Wound Care Topical Primary Dressing KerraCel Ag Gelling Fiber Dressing, 4x5 in (silver alginate) Discharge Instruction: Apply silver alginate to wound bed as instructed Secondary Dressing Optifoam Non-Adhesive Dressing, 4x4 in Discharge Instruction: Apply over primary dressing as directed. Secured With Elastic Bandage 4 inch (ACE bandage) Discharge Instruction: Secure with ACE bandage as directed. Kerlix Roll Sterile, 4.5x3.1 (in/yd) Discharge Instruction: Secure with Kerlix as directed. Usman, Rosette [name] ([id number]) [id number] Page 9 of 11 46M Medipore Soft Cloth Surgical T 2x10 (in/yd) ape Discharge Instruction: Secure with tape as directed. Compression Wrap Compression  Stockings Add-Ons Electronic Signature(s) Signed: 07/19/2022 8:53:47 AM By: [name] Signed: 07/19/2022 10:25:58 AM By: Dellie Catholic RN Entered By: [name] on 07/19/2022 08:46:49 -------------------------------------------------------------------------------- Wound Assessment Details Patient Name: Date of Service: [name], [name] 07/19/2022 8:00 A [name] Medical Record Number: [id number] Patient Account Number: [id number] Date of Birth/Sex: Treating RN: 1961-08-31 (61 y.o. [name] Primary Care Artha Stavros: [name] Other Clinician: Referring Maybelline Kolarik: Treating Kristain Filo/Extender: Louann Sjogren in Treatment: 0 Wound Status Wound Number: 19 Primary Diabetic Wound/Ulcer of the Lower Extremity Etiology: Wound Location: Left, Plantar Foot Wound Open Wounding Event: Gradually Appeared Status: Date Acquired: 05/17/2022 Comorbid Cataracts, Lymphedema, Asthma,  Congestive Heart Failure, Weeks Of Treatment: 0 History: Coronary Artery Disease, Hypertension, Peripheral Arterial Disease, Clustered Wound: No Peripheral Venous Disease, Type II Diabetes, Rheumatoid Arthritis, Neuropathy Photos Wound Measurements Length: (cm) 5.5 Width: (cm) 5.2 Depth: (cm) 0.4 Area: (cm) 22.462 Volume: (cm) 8.985 % Reduction in Area: % Reduction in Volume: Epithelialization: None Tunneling: No Undermining: No Wound Description Classification: Wound Margin: Exudate Amount: Exudate Type: Exudate Color: Grade 2 Distinct, outline attached Medium Serosanguineous red, brown Wound Bed Granulation Amount: Small (1-33%) Exposed Structure Granulation Quality: Pink Fascia Exposed: No Necrotic Amount: Large (67-100%) Fat Layer (Subcutaneous Tissue) Exposed: Yes Necrotic Quality: Eschar, Adherent Slough Tendon Exposed: No Muscle Exposed: No Wax, [name] ([id number]) [id number] Page 10 of 11 Joint Exposed: No Bone Exposed:  No Periwound Skin Texture Texture Color No Abnormalities Noted: No No Abnormalities Noted: No Callus: Yes Moisture No Abnormalities Noted: No Dry / Scaly: Yes Treatment Notes Wound #19 (Foot) Wound Laterality: Plantar, Left Cleanser Soap and Water Discharge Instruction: May shower and wash wound with dial antibacterial soap and water prior to dressing change. Wound Cleanser Discharge Instruction: Cleanse the wound with wound cleanser prior to applying a clean dressing using gauze sponges, not tissue or cotton balls. Byram Ancillary Kit - 15 Day Supply Discharge Instruction: Use supplies as instructed; Kit contains: (15) Saline Bullets; (15) 3x3 Gauze; 15 pr Gloves Peri-Wound Care Topical Primary Dressing KerraCel Ag Gelling Fiber Dressing, 2x2 in (silver alginate) Discharge Instruction: Apply silver alginate to wound bed as instructed Secondary Dressing Secured With Elastic Bandage 4 inch (ACE bandage) Discharge Instruction: Secure with ACE bandage as directed. Kerlix Roll Sterile, 4.5x3.1 (in/yd) Discharge Instruction: Secure with Kerlix as directed. 38M Medipore Soft Cloth Surgical T 2x10 (in/yd) ape Discharge Instruction: Secure with tape as directed. Compression Wrap Compression Stockings Add-Ons Electronic Signature(s) Signed: 07/19/2022 11:52:49 AM By: Dellie Catholic RN Previous Signature: 07/19/2022 8:53:47 AM Version By: [name] Previous Signature: 07/19/2022 10:25:58 AM Version By: Dellie Catholic RN Entered By: Dellie Catholic on 07/19/2022 11:51:36 -------------------------------------------------------------------------------- Vitals Details Patient Name: Date of Service: [name], [name] 07/19/2022 8:00 A [name] Medical Record Number: [id number] Patient Account Number: [id number] Date of Birth/Sex: Treating RN: 12/24/1960 (61 y.o. F) Primary Care Jaycelynn Knickerbocker: [name] Other Clinician: Referring Jamicia Haaland: Treating Jelena Malicoat/Extender: Louann Sjogren in Treatment: 0 Vital Signs Time Taken: 08:00 Temperature (F): 98.0 Height (in): 62 Pulse (bpm): 92 Weight (lbs): 284 Respiratory Rate (breaths/min): 20 [name], [name] ([id number]) [id number] Page 11 of 11 Body Mass Index (BMI): 51.9 Blood Pressure (mmHg): 128/71 Capillary Blood Glucose (mg/dl): 124 Reference Range: 80 - 120 mg / dl Electronic Signature(s) Signed: 07/19/2022 8:53:47 AM By: [name] Entered By: [name] on 07/19/2022 08:04:53

## 2022-07-19 NOTE — Progress Notes (Addendum)
Boswell, Deiondra M ([id number]) [id number] Page 1 of 14 Visit Report for 07/19/2022 Chief Complaint Document Details Patient Name: Date of Service: [place], [name] 07/19/2022 8:00 A M Medical Record Number: [id number] Patient Account Number: [id number] Date of Birth/Sex: Treating RN: 1961-03-16 (61 y.o. F) Primary Care Provider: [name] Other Clinician: Referring Provider: Treating Provider/Extender: Louann Sjogren in Treatment: 0 Information Obtained from: Patient Chief Complaint 10/17/2021; multiple scattered wounds to the abdomen, posterior left leg, left labia and sacrum 07/19/2022: left foot DFU Electronic Signature(s) Signed: 07/19/2022 9:34:42 AM By: Fredirick Maudlin MD FACS Previous Signature: 07/19/2022 8:22:57 AM Version By: Fredirick Maudlin MD FACS Entered By: Fredirick Maudlin on 07/19/2022 09:34:42 -------------------------------------------------------------------------------- Debridement Details Patient Name: Date of Service: [place], [name] 07/19/2022 8:00 A M Medical Record Number: [id number] Patient Account Number: [id number] Date of Birth/Sex: Treating RN: December 23, 1960 (61 y.o. [name] Primary Care Provider: [name] Other Clinician: Referring Provider: Treating Provider/Extender: Louann Sjogren in Treatment: 0 Debridement Performed for Assessment: Wound #18 Left,Anterior Lower Leg Performed By: Physician Fredirick Maudlin, MD Debridement Type: Debridement Severity of Tissue Pre Debridement: Fat layer exposed Level of Consciousness (Pre-procedure): Awake and Alert Pre-procedure Verification/Time Out Yes - 09:00 Taken: Start Time: 09:00 Pain Control: Lidocaine 5% topical ointment T Area Debrided (L x W): otal 1.4 (cm) x 1.8 (cm) = 2.52 (cm) Tissue and other material debrided: Non-Viable, Slough, Subcutaneous, Slough Level: Skin/Subcutaneous  Tissue Debridement Description: Excisional Instrument: Curette Bleeding: Large Hemostasis Achieved: Other Topical Anticoagulant : Quick Clot End Time: 09:03 Procedural Pain: 0 Post Procedural Pain: 0 Response to Treatment: Procedure was tolerated well Level of Consciousness (Post- Awake and Alert procedure): Post Debridement Measurements of Total Wound Length: (cm) 1.4 Width: (cm) 1.8 Depth: (cm) 0.1 Volume: (cm) 0.198 Dewoody, Patrick M ([id number]) [id number] Page 2 of 14 Character of Wound/Ulcer Post Debridement: Improved Severity of Tissue Post Debridement: Fat layer exposed Post Procedure Diagnosis Same as Pre-procedure Notes Scribed for Dr. Celine Ahr by J.Scotton Electronic Signature(s) Signed: 07/19/2022 10:25:58 AM By: Dellie Catholic RN Signed: 07/19/2022 12:55:38 PM By: Fredirick Maudlin MD FACS Entered By: Dellie Catholic on 07/19/2022 09:12:49 -------------------------------------------------------------------------------- Debridement Details Patient Name: Date of Service: [place], [name] 07/19/2022 8:00 A M Medical Record Number: [id number] Patient Account Number: [id number] Date of Birth/Sex: Treating RN: May 20, 1961 (60 y.o. [name] Primary Care Provider: [name] Other Clinician: Referring Provider: Treating Provider/Extender: Louann Sjogren in Treatment: 0 Debridement Performed for Assessment: Wound #19 [place] Performed By: Physician Fredirick Maudlin, MD Debridement Type: Debridement Severity of Tissue Pre Debridement: Fat layer exposed Level of Consciousness (Pre-procedure): Awake and Alert Pre-procedure Verification/Time Out Yes - 09:00 Taken: Start Time: 09:00 Pain Control: Lidocaine 5% topical ointment T Area Debrided (L x W): otal 5.5 (cm) x 5.2 (cm) = 28.6 (cm) Tissue and other material debrided: Non-Viable, Fat, Muscle, Slough, Slough Level: Skin/Subcutaneous  Tissue/Muscle Debridement Description: Excisional Instrument: Blade, Other : Hemostats Bleeding: Large Hemostasis Achieved: Other Topical Anticoagulant : Quick Clot End Time: 09:03 Procedural Pain: 0 Post Procedural Pain: 0 Response to Treatment: Procedure was tolerated well Level of Consciousness (Post- Awake and Alert procedure): Post Debridement Measurements of Total Wound Length: (cm) 5.5 Width: (cm) 5.2 Depth: (cm) 0.4 Volume: (cm) 8.985 Character of Wound/Ulcer Post Debridement: Improved Severity of Tissue Post Debridement: Fat layer exposed Post Procedure Diagnosis Same as Pre-procedure Notes Scribed for Dr. Celine Ahr by J.Scotton Electronic Signature(s) Signed: 07/19/2022 10:25:58 AM By: Dellie Catholic RN Signed:  07/19/2022 12:55:38 PM By: Fredirick Maudlin MD FACS Entered By: Dellie Catholic on 07/19/2022 09:14:28 Ries, [name] ([id number]) [id number] Page 3 of 14 -------------------------------------------------------------------------------- HPI Details Patient Name: Date of Service: [place], [name] 07/19/2022 8:00 A M Medical Record Number: [id number] Patient Account Number: [id number] Date of Birth/Sex: Treating RN: 05/06/1961 (61 y.o. F) Primary Care Provider: [name] Other Clinician: Referring Provider: Treating Provider/Extender: Louann Sjogren in Treatment: 0 History of Present Illness HPI Description: 07/23/2019 on evaluation today patient presents with a myriad of wounds noted at multiple locations over her right heel, right lower extremity, and abdominal region. She also has bilateral lower extremity lymphedema which is quite significant as well. Incidentally she also has congestive heart failure and hypertension. She also is obese. With that being said I think all this is contributing as well to her lower extremity edema which is very much uncontrolled. It seems like its been at least  several months since she is worn any compression according to what she tells me. With that being said I am not sure exactly when that would have been. She does have fibrotic changes in the lower extremity secondary to lymphedema worse on the left than the right. She did show me pictures of wound she had on the anterior portion of her shin which was quite significant fortunately that has healed. These issues have been intermittent at this time. Again I think that with appropriate compression therapy she may actually be doing better than what we are seeing at this point but again I am not really sure that she is ever been extremely compliant with that. Fortunately there is no signs of active infection at this time. No fever chills noted. As far as the abdominal ulcer she initially had one wound that she thinks may have been a bug bite. Subsequently she was put a dressing on this and she states that the tape pulled skin off on other locations causing other wounds that have not healed that is been about 3 months. The wounds on her lower extremities have been intermittent over 3 years. This includes the heel. 11/19; this is a patient that I have not seen previously. She was admitted to our clinic last week with multiple wounds including several superficial circular areas on her abdomen predominantly right upper quadrant. Also 1 in her umbilicus. She has an area on her right lateral malleolus which was new today. She has bilateral lower extremity edema with very significant stasis dermatitis on the left anterior tibial area. She has tightly adherent skin in her lower extremities probably secondary to cutaneous fibrosis in the area. We put her in compression last week. She comes in with the dressings reasonably saturated. 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 She has multiple areas on her arms from scratching. 12/3; the patient I saw for the first time 2 weeks ago. She has 3 small circular areas on her abdomen 2 in the right upper quadrant one on her umbilicus. We have been using silver alginate to this area. She also had an area on her right lateral malleolus and right heel and right medial malleolus. We have been using silver alginate here under compression. She does not have an open area on the left leg today. We are going to order her stockings for the left leg. She clearly  has chronic lymphedema in these areas. X-ray of the foot from 10/20 did not show any fracture or dislocation. The heel wound was noted there was no evidence of osteomyelitis She complains of generalized pruritus. 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 She has longstanding lymphedema. Although she does not have known liver disease I looked at his CT scan of the abdomen from February of this year that showed hepatic steatosis 12/11; patient still has no open area on the left leg we transitioned her into a stocking she had. Her stockings from [employer] are supposed to be arriving later today which will be the stockings of choice. The only wound remaining on the right is the right heel 2 small superficial open areas. Everything else is well on its way to healing in the right leg few small excoriations. She has 1 major area remaining on her abdomen the rest seem to be healing 12/22 patient still has no open area on the left leg and she is still in a stocking. She had still 2 open areas on the tip of her right heel. These look like pressure related areas although the patient is not really certain how this is happening. She has an open heeled shoe that we provided. Still has 1 open area on the right lateral abdomen The patient has complaints of generalized pruritus. She has multiple excoriated areas  on her abdomen that of close over her arms or thighs which I think are from scratching. She tells me that she has never had a basic work-up for this which might include basic lab work, liver functions, kidney functions, thyroid etc. She might also benefit from a dermatologist 12/29; the patient has a deeper larger more painful wound on the tip of her right heel. Again these look like pressure ulcers although she wears an open toed heel pads or heel at night to prevent it from hitting the mattress etc. They tell me and remind me that this is been present on and off for about 2 years that has closed over but then will reopen. I did look over her lab work that was available in [place]. She does not have elevated liver function tests or creatinine. I was not able to see a TSH. This was in response to her complaints of generalized itching and a itch/scratch cycle. I also noted that she is on OxyContin and oxycodone and of course narcotics can cause generalized pruritus as a side effect Readmission: History From [place], [place] Last Week: 03/29/2021 this is a patient who presents for initial evaluation here in the clinic though I have seen her 1 time previously in 2020 this was in [place]. That was actually her initial visit therefore a heel ulceration. Subsequently that did not healing she actually saw me the 1 time in November and then subsequently saw Dr. Dellia Nims through the end of December where she apparently was healed. 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 This is on the right. With that being said right now she has a basically region on the lateral portion of the ray site where she is applying more pressure especially as her foot seems to be starting to turn in and this has become an increasingly significant issue for her to be honest. She does see Dr. Sharol Given and Dr. Sharol Given has had  her on doxycycline for quite  a bit of time here. Subsequently she is just now wrapping that out. I think that she probably would be benefited by continue the doxycycline for a time while we can work through what we need to do with things here. She does have evidence of osteomyelitis based on what I saw on the MRI today. This obviously is unfortunate and definitely not something that she was hoping to hear. With that being said I do believe that she would potentially be a strong candidate for hyperbaric oxygen therapy. Also believe she could be a candidate for total contact cast though we have to be cautious due to the fact that how her foot is bending inward I think that this is good to be a little bit of a concern. We will definitely have to keeping a close eye on things. She does have a history of diabetes mellitus type 2. She also other than the amputation has a medical history positive for hypertension. She has never undergone hyperbaric oxygen therapy previously I did show her the chamber we did talk a little bit about it today as well. Admission [facility]: o 04/06/2021 Patient presents here in [place] today for evaluation. Again she is establishing care here after I saw her last week in [place]. Obviously I think that she is doing extremely well at this point which is great news and in general I am extremely pleased with where things stand from the standpoint of the appearance of the wound. Obviously this is not significantly smaller but does look a lot cleaner I think using the Hydrofera Blue has been of benefit over the past week. Nonetheless she still has a wound with issues with pressure here which I think is good to be an ongoing issue. Also think that the osteomyelitis which is chronic is also getting ongoing issue. She does want to try to do what she can to prevent this from worsening and ending with a below-knee amputation. T that end I do think that getting her into the  hyperbaric oxygen chamber would be what we need to do. She has recently been seen by cardiology o and subsequently well as far as that is concerned. Her ejection fraction was appropriate for hyperbarics and everything seems to be doing great in that regard. She has not had a recent chest x-ray she is a former smoker around 15 years ago she quit. Nonetheless I do believe with her asthma we do want to do a chest Whitefield, Ilene M ([id number]) [id number] Page 4 of 14 x-ray just to make sure everything is okay before proceeding with the hyperbarics although we can go ahead and see about getting the approval. I also think she is going require some sharp debridement and around this wound we also have to contact her insurance for prior approval on this as well. 04/13/2021 upon evaluation today patient appears to be doing a little bit better in regard to her leg in fact the swelling is dramatically better. Very pleased with where things stand in that regard. Fortunately there does not appear to be any signs of active infection at this time. No fevers, chills, nausea, vomiting, or diarrhea. 04/20/2021 upon evaluation today patient appears to be doing decently well in regard to her foot. There is some need for sharp debridement here today. With that being said we will get a go ahead and proceed with that today as the foot is becoming somewhat macerated with the overhanging callus that is definitely not we want to see.  With that being said the patient does have an issue here as well with a boil in the perineal region unfortunately that is an issue for her today as well. I did have a look at that as well. We are still working on dealing with insurance as far as getting hyperbarics approved. 04/27/2021 upon evaluation today patient appears to be doing somewhat poorly in general compared to where she has been. At this point she is having a lot of swelling which is the main concerning thing  that I am seeing. There does not appear to be any signs of infection currently which is good news but at the same time I do feel like that she is a lot more swollen than she was even last week and is showing how much she is weeping in regard to the right lower leg. She also has a blister on the left breast although is not an open wound at this time. She continues to have the area in the perineum as well. 05/04/2021 upon evaluation today patient appears to actually be doing decently well in regard to her wound on the foot. Fortunately there is no signs of active infection at this time. No fevers, chills, nausea, vomiting, or diarrhea. Unfortunately she does have an area on the left breast which is actually appearing to be a burn based on what I see physically. She does note that she uses rice bags for areas in general and may have left one in that area not realizing it was burning her as she does not really have much feeling. I think this is very probable based on what I am seeing. For that reason working to treat this as such I do think we need to loosen up a lot of the necrotic tissue here. I Am going tosend in a prescription for Santyl for the patient. 9/1; patient presents for HBO today but cannot do treatment due to wheezing and feeling short of breath when laying down. She was set up as a doctor visit today since she was not able to do HBO. She states she feels okay overall and started having a dry cough over the past couple days. She has not tested herself for COVID. She denies fever/chills or sputum production. She thinks her symptoms are related to allergies. She has been using silver alginate to her right plantar foot wound. She has not been using Santyl to the breast wound because she reports forgetting to do this. She uses zinc oxide to her perennial wound. She currently denies systemic signs of infection. 9/8; patient presents for follow-up. She has been a unable to do HBO treatments for the  past week due to her back pain. She is currently taking Flexeril to address this issue. She reports no issues with her compression wrap. She has been putting Santyl on the left breast wound and Monistat cream to the perennial wound. She denies signs of infection. 9/15; patient presents for follow-up. Again she is unable to do HBO treatment today due to sinus pain. She has 2 new wounds to her right foot which she is unaware of. She has been putting Santyl on the left breast wound and zinc oxide to the perineal wound. She currently denies signs of infection. Readmission 10/17/2021 Ms. Roshanda Balazs is a 61 year old female with a past medical history of type 2 diabetes, osteomyelitis status post right BKA and morbid obesity that presents to the clinic for multiple scattered open wounds to her body. These are located to the  abdomen, left posterior leg and sacral region. She has been using mupirocin ointment, Neosporin and zinc oxide to the wound beds. She has been started on Bactrim and Keflex by her primary care physician and states she has several days left to complete the course. She currently denies systemic signs of infection. READMISSION This is a 61 year old morbidly obese type II diabetic (poorly controlled with last hemoglobin A1c 9.2%). She has congestive heart failure, peripheral vascular disease, hypertension, coronary artery disease, venous stasis, connective tissue disease (o Sjogren's syndrome), and bilateral lower extremity edema. She has been seen here for various reasons in the past. She has a right above-knee amputation. She was recently hospitalized for a left diabetic foot ulcer. Orthopedic surgery was consulted. An MRI was performed that was not conclusive for osteomyelitis, but given the extent of the necrosis, orthopedics recommended amputation. Infectious disease was also consulted and have recommended a 6-week course of oral doxycycline and Augmentin. Apparently wound care was also  consulted while she was in the hospital and simply recommended painting the area with Betadine. She saw infectious disease on October 27 with plans to continue doxycycline on Augmentin to continue therapy until November 14. The infectious disease provider also recommended that the patient reconsider amputation. Her PCP referred her to the wound care center, as the patient is very reluctant to undergo amputation. She also has a small ulcer on her anterior tibial surface that recently opened after a minor trauma. On exam, her left foot is rolled such that the lateral aspect of her foot is the contact surface. There is a large ulcer with exposed necrotic muscle and fat. The wound does not probe to bone, but apparently there was bone exposure in the past while she was in the hospital. No malodor or purulent drainage. The wound on her anterior tibial surface is small with just a little slough accumulation. She has modest edema and skin changes consistent with stasis dermatitis. Electronic Signature(s) Signed: 07/19/2022 9:36:40 AM By: Fredirick Maudlin MD FACS Previous Signature: 07/19/2022 8:32:29 AM Version By: Fredirick Maudlin MD FACS Entered By: Fredirick Maudlin on 07/19/2022 09:36:40 -------------------------------------------------------------------------------- Physical Exam Details Patient Name: Date of Service: [place], [name] 07/19/2022 8:00 A M Medical Record Number: [id number] Patient Account Number: [id number] Date of Birth/Sex: Treating RN: Feb 05, 1961 (61 y.o. F) Primary Care Provider: [name] Other Clinician: Referring Provider: Treating Provider/Extender: Louann Sjogren in Treatment: 0 Constitutional . . . . No acute distress. Respiratory Normal work of breathing on room air.Marland Kitchen Bailey, Annamarie M ([id number]) [id number] Page 5 of 14 Notes 07/19/2022: On exam, her left foot is rolled such that the lateral aspect of her  foot is the contact surface. There is a large ulcer with exposed necrotic muscle and fat. The wound does not probe to bone, but apparently there was bone exposure in the past while she was in the hospital. No malodor or purulent drainage. The wound on her anterior tibial surface is small with just a little slough accumulation. She has modest edema and skin changes consistent with stasis dermatitis. Electronic Signature(s) Signed: 07/19/2022 9:39:41 AM By: Fredirick Maudlin MD FACS Previous Signature: 07/19/2022 9:39:10 AM Version By: Fredirick Maudlin MD FACS Entered By: Fredirick Maudlin on 07/19/2022 09:39:41 -------------------------------------------------------------------------------- Physician Orders Details Patient Name: Date of Service: [place], [name] 07/19/2022 8:00 A M Medical Record Number: [id number] Patient Account Number: [id number] Date of Birth/Sex: Treating RN: [birth year] (61 y.o. 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 - Dr. Celine Ahr Room 3 Return A Anesthetic (In clinic) Topical Lidocaine 5% applied to wound bed - In Clinic Off-Loading Other: - Try and elevate Left leg throughout the day Wound Treatment Wound #18 - Lower Leg Wound Laterality: Left, Anterior Cleanser: Soap and Water 1 x Per Day/30  Days Discharge Instructions: May shower and wash wound with dial antibacterial soap and water prior to dressing change. Cleanser: Wound Cleanser (DME) (Generic) 1 x Per Day/30 Days Discharge Instructions: Cleanse the wound with wound cleanser prior to applying a clean dressing using gauze sponges, not tissue or cotton balls. Cleanser: [place] Ancillary Kit - 15 Day Supply (DME) (Generic) 1 x Per Day/30 Days Discharge Instructions: Use supplies as instructed; Kit contains: (15) Saline Bullets; (15) 3x3 Gauze; 15 pr Gloves Prim Dressing: KerraCel Ag Gelling Fiber Dressing, 4x5 in (silver alginate) (DME) (Generic) 1 x Per Day/30 Days ary Discharge Instructions: Apply silver alginate to wound bed as instructed Secondary Dressing: Optifoam Non-Adhesive Dressing, 4x4 in (DME) (Generic) 1 x Per Day/30 Days Discharge Instructions: Apply over primary dressing as directed. Secured With: Elastic Bandage 4 inch (ACE bandage) (DME) (Generic) 1 x Per Day/30 Days Discharge Instructions: Secure with ACE bandage as directed. Secured With: [employer], 4.5x3.1 (in/yd) (DME) (Generic) 1 x Per Day/30 Days Discharge Instructions: Secure with Kerlix as directed. Tilley, Heloise M ([id number]) [id number] Page 6 of 14 Secured With: 481M [employer] Public affairs consultant Surgical T 2x10 (in/yd) (DME) (Generic) 1 x Per Day/30 Days ape Discharge Instructions: Secure with tape as directed. Wound #19 - Foot Wound Laterality: Plantar, Left Cleanser: Soap and Water 1 x Per Day/30 Days Discharge Instructions: May shower and wash wound with dial antibacterial soap and water prior to dressing change. Cleanser: Wound Cleanser (DME) (Generic) 1 x Per Day/30 Days Discharge Instructions: Cleanse the wound with wound cleanser prior to applying a clean dressing using gauze sponges, not tissue or cotton balls. Cleanser: [place] Ancillary Kit - 15 Day Supply (DME) (Generic) 1 x Per Day/30 Days Discharge Instructions:  Use supplies as instructed; Kit contains: (15) Saline Bullets; (15) 3x3 Gauze; 15 pr Gloves Prim Dressing: KerraCel Ag Gelling Fiber Dressing, 2x2 in (silver alginate) (DME) (Generic) 1 x Per Day/30 Days ary Discharge Instructions: Apply silver alginate to wound bed as instructed Secured With: Elastic Bandage 4 inch (ACE bandage) (DME) (Generic) 1 x Per Day/30 Days Discharge Instructions: Secure with ACE bandage as directed. Secured With: [employer], 4.5x3.1 (in/yd) (DME) (Generic) 1 x Per Day/30 Days Discharge Instructions: Secure with Kerlix as directed. Secured With: 481M [employer] Public affairs consultant Surgical T 2x10 (in/yd) (DME) (Generic) 1 x Per Day/30 Days ape Discharge Instructions: Secure with tape as directed. Electronic Signature(s) Signed: 07/19/2022 12:55:38 PM By: Fredirick Maudlin MD FACS Entered By: Fredirick Maudlin on 07/19/2022 09:39:52 -------------------------------------------------------------------------------- Problem List Details Patient Name: Date of Service: Krakowski, [name] 07/19/2022 8:00 A M Medical Record Number: [id number] Patient Account Number: [id number] Date of Birth/Sex: Treating RN: 05-Sep-1961 (61 y.o. 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 07/19/2022 No Yes L97.822 Non-pressure chronic ulcer of other part of left lower leg with fat layer exposed11/04/2022 No Yes M86.9 Osteomyelitis, unspecified 07/19/2022 No Yes E11.621 Type 2 diabetes mellitus with foot ulcer 07/19/2022 No Yes E66.01 Morbid (severe) obesity due to excess calories 07/19/2022 No Yes M35.00 Sjogren syndrome, unspecified 07/19/2022 No Yes Facey, Ruvi M ([id number]) [id number]  Page 7 of 14 I73.9 Peripheral vascular disease, unspecified 07/19/2022 No Yes K46.28 Chronic diastolic (congestive) heart failure 07/19/2022 No Yes 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 07/19/2022 8:00 A M Medical Record Number: [id number] Patient Account Number: [id number] Date of Birth/Sex: Treating RN: [birth year] (61 y.o. F) Primary Care Provider: [name] Other Clinician: Referring Provider: Treating Provider/Extender: Louann Sjogren in Treatment: 0 Subjective Chief Complaint Information obtained from Patient 10/17/2021; multiple scattered wounds to the abdomen, posterior left leg, left labia and sacrum 07/19/2022: left foot DFU History of Present Illness (HPI) 07/23/2019 on evaluation today patient presents with a myriad of wounds noted at multiple locations over her right heel, right lower extremity, and abdominal region. She also has bilateral lower extremity lymphedema which is quite significant as well. Incidentally she also has congestive heart failure and hypertension. She also is obese. With that being said I think all this is contributing as well to her lower extremity edema which is very much uncontrolled. It seems like its been at least several months since she is worn any compression according to what she tells me. With that being said I am not sure exactly when that would have been. She does have fibrotic changes in the lower extremity secondary to lymphedema worse on the left than the right. She did show me pictures  of wound she had on the anterior portion of her shin which was quite significant fortunately that has healed. These issues have been intermittent at this time. Again I think that with appropriate compression therapy she may actually be doing better than what we are seeing at this point but again I am not really sure that she is ever been extremely compliant with that. Fortunately there is no signs of active infection at this time. No fever chills noted. As far as the abdominal ulcer she initially had one wound that she thinks may have been a bug bite. Subsequently she was put a dressing on this and she states that the tape pulled skin off on other locations causing other wounds that have not healed that is been about 3 months. The wounds on her lower extremities have been intermittent over 3 years. This includes the heel. 11/19; this is a patient that I have not seen previously. She was admitted to our clinic last week with multiple wounds including several superficial circular areas on her abdomen predominantly right upper quadrant. Also 1 in her umbilicus. She has an area on her right lateral malleolus which was new today. She has bilateral lower extremity edema with very significant stasis dermatitis on the left anterior tibial area. She has tightly adherent skin in  her lower extremities probably secondary to cutaneous fibrosis in the area. We put her in compression last week. She comes in with the dressings reasonably saturated. 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 She has multiple areas on her arms from scratching. 12/3; the patient I saw for the first time 2 weeks ago. She has 3 small circular areas on her abdomen 2 in the right upper quadrant one on her umbilicus. We have been using silver  alginate to this area. She also had an area on her right lateral malleolus and right heel and right medial malleolus. We have been using silver alginate here under compression. She does not have an open area on the left leg today. We are going to order her stockings for the left leg. She clearly has chronic lymphedema in these areas. X-ray of the foot from 10/20 did not show any fracture or dislocation. The heel wound was noted there was no evidence of osteomyelitis She complains of generalized pruritus. 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 She has longstanding lymphedema. Although she does not have known liver disease I looked at his CT scan of the abdomen from February of this year that showed hepatic steatosis 12/11; patient still has no open area on the left leg we transitioned her into a stocking she had. Her stockings from [place] are supposed to be arriving later today which will be the stockings of choice. The only wound remaining on the right is the right heel 2 small superficial open areas. Everything else is well on its way to healing in the right leg few small excoriations. She has 1 major area remaining on her abdomen the rest seem to be healing 12/22 patient still has no open area on the left leg and she is still in a stocking. She had still 2 open areas on the tip of her right heel. These look like pressure related areas although the patient is not really certain how this is happening. She has an open heeled shoe that we provided. Still has 1 open area on the right Stoneman, Lovenia M ([id number]) [id number] Page 8 of 14 lateral abdomen The patient has complaints of generalized pruritus. She has multiple excoriated areas on her abdomen that of close over her arms or thighs which I think are from scratching. She tells me that she has never had a basic work-up for this which might include basic lab work, liver functions,  kidney functions, thyroid etc. She might also benefit from a dermatologist 12/29; the patient has a deeper larger more painful wound on the tip of her right heel. Again these look like pressure ulcers although she wears an open toed heel pads or heel at night to prevent it from hitting the mattress etc. They tell me and remind me that this is been present on and off for about 2 years that has closed over but then will reopen. I did look over her lab work that was available in [place]. She does not have elevated liver function tests or creatinine. I was not able to see a TSH. This was in response to her complaints of generalized itching and a itch/scratch cycle. I also noted that she is on OxyContin and oxycodone and of course narcotics can cause generalized pruritus as a side effect Readmission: History From  [place], [place] Last Week: 03/29/2021 this is a patient who presents for initial evaluation here in the clinic though I have seen her 1 time previously in 2020 this was in [place]. That was actually her initial visit therefore a heel ulceration. Subsequently that did not healing she actually saw me the 1 time in November and then subsequently saw Dr. Dellia Nims through the end of December where she apparently was healed. 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 This is on the right. With that being said right now she has a basically region on the lateral portion of the ray site where she is applying more pressure especially as her foot seems to be starting to turn in and this has become an increasingly significant issue for her to be honest. She does see Dr. Sharol Given and Dr. Sharol Given has had her on doxycycline for quite a bit of time here. Subsequently she is just now wrapping that out. I think that she probably would be benefited by continue the doxycycline for a time while we can work  through what we need to do with things here. She does have evidence of osteomyelitis based on what I saw on the MRI today. This obviously is unfortunate and definitely not something that she was hoping to hear. With that being said I do believe that she would potentially be a strong candidate for hyperbaric oxygen therapy. Also believe she could be a candidate for total contact cast though we have to be cautious due to the fact that how her foot is bending inward I think that this is good to be a little bit of a concern. We will definitely have to keeping a close eye on things. She does have a history of diabetes mellitus type 2. She also other than the amputation has a medical history positive for hypertension. She has never undergone hyperbaric oxygen therapy previously I did show her the chamber we did talk a little bit about it today as well. Admission [name]: o 04/06/2021 Patient presents here in [place] today for evaluation. Again she is establishing care here after I saw her last week in [place]. Obviously I think that she is doing extremely well at this point which is great news and in general I am extremely pleased with where things stand from the standpoint of the appearance of the wound. Obviously this is not significantly smaller but does look a lot cleaner I think using the Hydrofera Blue has been of benefit over the past week. Nonetheless she still has a wound with issues with pressure here which I think is good to be an ongoing issue. Also think that the osteomyelitis which is chronic is also getting ongoing issue. She does want to try to do what she can to prevent this from worsening and ending with a below-knee amputation. T that end I do think that getting her into the hyperbaric oxygen chamber would be what we need to do. She has recently been seen by cardiology o and subsequently well as far as that is concerned. Her ejection fraction was appropriate for  hyperbarics and everything seems to be doing great in that regard. She has not had a recent chest x-ray she is a former smoker around 15 years ago she quit. Nonetheless I do believe with her asthma we do want to do a chest x-ray just to make sure everything  is okay before proceeding with the hyperbarics although we can go ahead and see about getting the approval. I also think she is going require some sharp debridement and around this wound we also have to contact her insurance for prior approval on this as well. 04/13/2021 upon evaluation today patient appears to be doing a little bit better in regard to her leg in fact the swelling is dramatically better. Very pleased with where things stand in that regard. Fortunately there does not appear to be any signs of active infection at this time. No fevers, chills, nausea, vomiting, or diarrhea. 04/20/2021 upon evaluation today patient appears to be doing decently well in regard to her foot. There is some need for sharp debridement here today. With that being said we will get a go ahead and proceed with that today as the foot is becoming somewhat macerated with the overhanging callus that is definitely not we want to see. With that being said the patient does have an issue here as well with a boil in the perineal region unfortunately that is an issue for her today as well. I did have a look at that as well. We are still working on dealing with insurance as far as getting hyperbarics approved. 04/27/2021 upon evaluation today patient appears to be doing somewhat poorly in general compared to where she has been. At this point she is having a lot of swelling which is the main concerning thing that I am seeing. There does not appear to be any signs of infection currently which is good news but at the same time I do feel like that she is a lot more swollen than she was even last week and is showing how much she is weeping in regard to the right lower leg. She  also has a blister on the left breast although is not an open wound at this time. She continues to have the area in the perineum as well. 05/04/2021 upon evaluation today patient appears to actually be doing decently well in regard to her wound on the foot. Fortunately there is no signs of active infection at this time. No fevers, chills, nausea, vomiting, or diarrhea. Unfortunately she does have an area on the left breast which is actually appearing to be a burn based on what I see physically. She does note that she uses rice bags for areas in general and may have left one in that area not realizing it was burning her as she does not really have much feeling. I think this is very probable based on what I am seeing. For that reason working to treat this as such I do think we need to loosen up a lot of the necrotic tissue here. I Am going tosend in a prescription for Santyl for the patient. 9/1; patient presents for HBO today but cannot do treatment due to wheezing and feeling short of breath when laying down. She was set up as a doctor visit today since she was not able to do HBO. She states she feels okay overall and started having a dry cough over the past couple days. She has not tested herself for COVID. She denies fever/chills or sputum production. She thinks her symptoms are related to allergies. She has been using silver alginate to her right plantar foot wound. She has not been using Santyl to the breast wound because she reports forgetting to do this. She uses zinc oxide to her perennial wound. She currently denies systemic signs of infection. 9/8;  patient presents for follow-up. She has been a unable to do HBO treatments for the past week due to her back pain. She is currently taking Flexeril to address this issue. She reports no issues with her compression wrap. She has been putting Santyl on the left breast wound and Monistat cream to the perennial wound. She denies signs of  infection. 9/15; patient presents for follow-up. Again she is unable to do HBO treatment today due to sinus pain. She has 2 new wounds to her right foot which she is unaware of. She has been putting Santyl on the left breast wound and zinc oxide to the perineal wound. She currently denies signs of infection. Readmission 10/17/2021 Ms. Daeja Helderman is a 61 year old female with a past medical history of type 2 diabetes, osteomyelitis status post right BKA and morbid obesity that presents to the clinic for multiple scattered open wounds to her body. These are located to the abdomen, left posterior leg and sacral region. She has been using mupirocin ointment, Neosporin and zinc oxide to the wound beds. She has been started on Bactrim and Keflex by her primary care physician and states she has several days left to complete the course. She currently denies systemic signs of infection. READMISSION This is a 61 year old morbidly obese type II diabetic (poorly controlled with last hemoglobin A1c 9.2%). She has congestive heart failure, peripheral vascular disease, hypertension, coronary artery disease, venous stasis, connective tissue disease (o Sjogren's syndrome), and bilateral lower extremity edema. She has been seen here for various reasons in the past. She has a right above-knee amputation. She was recently hospitalized for a left diabetic foot ulcer. Orthopedic surgery was consulted. An MRI was performed that was not conclusive for osteomyelitis, but given the extent of the necrosis, orthopedics recommended amputation. Infectious disease was also consulted and have recommended a 6-week course of oral doxycycline and Augmentin. Apparently wound care was also consulted while she was in the hospital and simply recommended painting the area with Betadine. She saw infectious disease on October 27 with plans to continue doxycycline on Augmentin to continue therapy until November 14. The infectious disease  provider also recommended that the patient reconsider amputation. Her PCP referred her to the wound care center, as the patient is very reluctant to undergo amputation. She also has a small ulcer on her anterior tibial surface that recently opened after a minor trauma. Noe, Cambre M ([id number]) [id number] Page 9 of 14 On exam, her left foot is rolled such that the lateral aspect of her foot is the contact surface. There is a large ulcer with exposed necrotic muscle and fat. The wound does not probe to bone, but apparently there was bone exposure in the past while she was in the hospital. No malodor or purulent drainage. The wound on her anterior tibial surface is small with just a little slough accumulation. She has modest edema and skin changes consistent with stasis dermatitis. Patient History Information obtained from Patient. Allergies fish oil, Fish Containing Products, Crestor, lovastatin, metronidazole, Red Yeast Rice ([place] Purpureus), rotigotine, Atrovent, Suprep Bowel Prep Kit, adhesive tape, Betadine, peanut (Severity: Severe), Dial soap (Reaction: itching), gabapentin (Severity: Moderate), iodine (Severity: Moderate), morphine (Severity: Severe), Suprep Bowel Prep Kit, Victoza (Severity: Moderate), Pepto-Bismol (Severity: Moderate) Family History Diabetes - Mother, Heart Disease - Mother,Father,Siblings, Hypertension - Mother,Father,Siblings, Lung Disease - Father, Stroke - Mother, No family history of Cancer, Hereditary Spherocytosis, Kidney Disease, Seizures, Thyroid Problems, Tuberculosis. Social History Former smoker - quit 15 years ago, Marital Status -  Married, Alcohol Use - Never, Drug Use - No History, Caffeine Use - Rarely. Medical History Eyes Patient has history of Cataracts - both eyes Denies history of Glaucoma, Optic Neuritis Ear/Nose/Mouth/Throat Denies history of Chronic sinus problems/congestion, Middle ear  problems Hematologic/Lymphatic Patient has history of Lymphedema Denies history of Anemia, Hemophilia, Human Immunodeficiency Virus, Sickle Cell Disease Respiratory Patient has history of Asthma Denies history of Aspiration, Chronic Obstructive Pulmonary Disease (COPD), Pneumothorax, Sleep Apnea, Tuberculosis Cardiovascular Patient has history of Congestive Heart Failure, Coronary Artery Disease, Hypertension, Peripheral Arterial Disease, Peripheral Venous Disease Denies history of Angina, Arrhythmia, Hypotension, Myocardial Infarction, Phlebitis, Vasculitis Gastrointestinal Denies history of Hepatitis A, Hepatitis B, Hepatitis C Endocrine Patient has history of Type II Diabetes Denies history of Type I Diabetes Genitourinary Denies history of End Stage Renal Disease Immunological Denies history of Lupus Erythematosus, Raynaudoos, Scleroderma Integumentary (Skin) Denies history of History of Burn Musculoskeletal Patient has history of Rheumatoid Arthritis Denies history of Gout, Osteoarthritis, Osteomyelitis Neurologic Patient has history of Neuropathy Denies history of Dementia, Quadriplegia, Paraplegia, Seizure Disorder Oncologic Denies history of Received Chemotherapy, Received Radiation Psychiatric Denies history of Anorexia/bulimia, Confinement Anxiety Hospitalization/Surgery History - 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 Sharol Given 01/2020. 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 - 11/22 R AKA. Medical A Surgical History Notes nd Constitutional Symptoms (General Health) hypothyroidism Cardiovascular cardiac stents Gastrointestinal GERD Endocrine Hypothyroidism Immunological Mix connective tissue disease- autoimmune Sjogren's syndrome Musculoskeletal Fibromyalgia, Scoliosis, Objective Constitutional No acute distress. Vitals Time Taken: 8:00 AM, Height: 62 in, Weight: 284 lbs, BMI: 51.9, Temperature: 98.0 F, Pulse: 92 bpm, Respiratory Rate: 20 breaths/min,  Blood Pressure: Kump, Skylen M ([id number]) [id number] Page 10 of 14 128/71 mmHg, Capillary Blood Glucose: 124 mg/dl. Respiratory Normal work of breathing on room air.. General Notes: 07/19/2022: On exam, her left foot is rolled such that the lateral aspect of her foot is the contact surface. There is a large ulcer with exposed necrotic muscle and fat. The wound does not probe to bone, but apparently there was bone exposure in the past while she was in the hospital. No malodor or purulent drainage. The wound on her anterior tibial surface is small with just a little slough accumulation. She has modest edema and skin changes consistent with stasis dermatitis. Integumentary (Hair, Skin) Wound #18 status is Open. Original cause of wound was Trauma. The date acquired was: 07/17/2022. The wound is located on the Left,Anterior Lower Leg. The wound measures 1.4cm length x 1.8cm width x 0.1cm depth; 1.979cm^2 area and 0.198cm^3 volume. There is Fat Layer (Subcutaneous Tissue) exposed. There is no tunneling or undermining noted. There is a medium amount of serosanguineous drainage noted. There is large (67-100%) red granulation within the wound bed. There is a small (1-33%) amount of necrotic tissue within the wound bed including Adherent Slough. The periwound skin appearance exhibited: Hemosiderin Staining. Wound #19 status is Open. Original cause of wound was Gradually Appeared. The date acquired was: 05/17/2022. The wound is located on the [place]. The wound measures 5.5cm length x 5.2cm width x 0.4cm depth; 22.462cm^2 area and 8.985cm^3 volume. There is Fat Layer (Subcutaneous Tissue) exposed. There is no tunneling or undermining noted. There is a medium amount of serosanguineous drainage noted. The wound margin is distinct with the outline attached to the wound base. There is small (1-33%) pink granulation within the wound bed. There is a large (67-100%) amount of  necrotic tissue within the wound bed including Eschar and Adherent Slough. The periwound  skin appearance exhibited: Callus, Dry/Scaly. 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 There was a Excisional Skin/Subcutaneous Tissue Debridement with a total area of 2.52 sq cm performed by Fredirick Maudlin, MD. With the following instrument(s): Curette to remove Non-Viable tissue/material. Material removed includes Subcutaneous Tissue and Slough and after achieving pain control using Lidocaine 5% topical ointment. A time out was conducted at 09:00, prior to the start of the procedure. A Large amount of bleeding was controlled with Other T opical Anticoagulant. The procedure was tolerated well with a pain level of 0 throughout and a pain level of 0 following the procedure. Post Debridement Measurements: 1.4cm length x 1.8cm width x 0.1cm depth; 0.198cm^3 volume. Character of Wound/Ulcer Post Debridement is improved. Severity of Tissue Post Debridement is: Fat layer exposed. Post procedure Diagnosis Wound #18: Same as Pre-Procedure General Notes: Scribed for Dr. Celine Ahr by J.Scotton. Wound #19 Pre-procedure diagnosis of Wound #19 is a Diabetic Wound/Ulcer of the Lower Extremity located on the Left,Plantar Foot .Severity of Tissue Pre Debridement is: Fat layer exposed. There was a Excisional Skin/Subcutaneous  Tissue/Muscle Debridement with a total area of 28.6 sq cm performed by Fredirick Maudlin, MD. With the following instrument(s): Blade, Hemostats to remove Non-Viable tissue/material. Material removed includes Fat, Muscle, and Slough after achieving pain control using Lidocaine 5% topical ointment. A time out was conducted at 09:00, prior to the start of the procedure. A Large amount of bleeding was controlled with Other Topical Anticoagulant. The procedure was tolerated well with a pain level of 0 throughout and a pain level of 0 following the procedure. Post Debridement Measurements: 5.5cm length x 5.2cm width x 0.4cm depth; 8.985cm^3 volume. Character of Wound/Ulcer Post Debridement is improved. Severity of Tissue Post Debridement is: Fat layer exposed. Post procedure Diagnosis Wound #19: Same as Pre-Procedure General Notes: Scribed for Dr. Celine Ahr by J.Scotton. Plan Follow-up Appointments: Return Appointment in 1 week. - Dr. Celine Ahr Room 3 Anesthetic: (In clinic) Topical Lidocaine 5% applied to wound bed - In Clinic Off-Loading: Other: - Try and elevate Left leg throughout the day WOUND #18: - Lower Leg Wound Laterality: Left, Anterior Cleanser: Soap and Water 1 x Per Day/30 Days Discharge Instructions: May shower and wash wound with dial antibacterial soap and water prior to dressing change. Cleanser: Wound Cleanser (DME) (Generic) 1 x Per Day/30 Days Demello, Marrissa M ([id number]) [id number] Page 11 of 14 Discharge Instructions: Cleanse the wound with wound cleanser prior to applying a clean dressing using gauze sponges, not tissue or cotton balls. Cleanser: Byram Ancillary Kit - 15 Day Supply (DME) (Generic) 1 x Per Day/30 Days Discharge Instructions: Use supplies as instructed; Kit contains: (15) Saline Bullets; (15) 3x3 Gauze; 15 pr Gloves Prim Dressing: KerraCel Ag Gelling Fiber Dressing, 4x5 in (silver alginate) (DME) (Generic) 1 x Per Day/30  Days ary Discharge Instructions: Apply silver alginate to wound bed as instructed Secondary Dressing: Optifoam Non-Adhesive Dressing, 4x4 in (DME) (Generic) 1 x Per Day/30 Days Discharge Instructions: Apply over primary dressing as directed.  Secured With: Elastic Bandage 4 inch (ACE bandage) (DME) (Generic) 1 x Per Day/30 Days Discharge Instructions: Secure with ACE bandage as directed. Secured With: [employer], 4.5x3.1 (in/yd) (DME) (Generic) 1 x Per Day/30 Days Discharge Instructions: Secure with Kerlix as directed. Secured With: 721M Medipore Public affairs consultant Surgical T 2x10 (in/yd) (DME) (Generic) 1 x Per Day/30 Days ape Discharge Instructions: Secure with tape as directed. WOUND #19: - Foot Wound Laterality: Plantar, Left Cleanser: Soap and Water 1 x Per Day/30 Days Discharge Instructions: May shower and wash wound with dial antibacterial soap and water prior to dressing change. Cleanser: Wound Cleanser (DME) (Generic) 1 x Per Day/30 Days Discharge Instructions: Cleanse the wound with wound cleanser prior to applying a clean dressing using gauze sponges, not tissue or cotton balls. Cleanser: Byram Ancillary Kit - 15 Day Supply (DME) (Generic) 1 x Per Day/30 Days Discharge Instructions: Use supplies as instructed; Kit contains: (15) Saline Bullets; (15) 3x3 Gauze; 15 pr Gloves Prim Dressing: KerraCel Ag Gelling Fiber Dressing, 2x2 in (silver alginate) (DME) (Generic) 1 x Per Day/30 Days ary Discharge Instructions: Apply silver alginate to wound bed as instructed Secured With: Elastic Bandage 4 inch (ACE bandage) (DME) (Generic) 1 x Per Day/30 Days Discharge Instructions: Secure with ACE bandage as directed. Secured With: [employer], 4.5x3.1 (in/yd) (DME) (Generic) 1 x Per Day/30 Days Discharge Instructions: Secure with Kerlix as directed. Secured With: 721M Medipore Public affairs consultant Surgical T 2x10 (in/yd) (DME) (Generic) 1 x Per Day/30 Days ape Discharge Instructions: Secure with  tape as directed. 07/19/2022: This is a poorly controlled, morbidly obese type II diabetic who has a Wagner 2 foot ulcer. On exam, her left foot is rolled such that the lateral aspect of her foot is the contact surface. There is a large ulcer with exposed necrotic muscle and fat. The wound does not probe to bone, but apparently there was bone exposure in the past while she was in the hospital. No malodor or purulent drainage. The wound on her anterior tibial surface is small with just a little slough accumulation. She has modest edema and skin changes consistent with stasis dermatitis. I used a curette to debride slough off of the anterior tibial wound. I used a scalpel to remove the nonviable muscle and fat from the foot. I also used a curette to debride slough and nonviable subcutaneous tissue from the foot ulcer. Hemostasis was achieved with quick clot. We will apply silver alginate to both wounds. We will construct a large foam donut to try and avoid pressure on the ulcer, this will be challenging because she is an amputee and relies on her foot for transfers. She will continue on her oral antibiotics as prescribed by infectious disease. Follow-up in 1 week. Electronic Signature(s) Signed: 07/19/2022 11:52:49 AM By: Dellie Catholic RN Signed: 07/19/2022 12:55:38 PM By: Fredirick Maudlin MD FACS Previous Signature: 07/19/2022 9:42:08 AM Version By: Fredirick Maudlin MD FACS Entered By: Dellie Catholic on 07/19/2022 11:51:56 -------------------------------------------------------------------------------- HxROS Details Patient Name: Date of Service: Nordby, [name] 07/19/2022 8:00 A M Medical Record Number: [id number] Patient Account Number: [id number] Date of Birth/Sex: Treating RN: 08-Jan-1961 (61 y.o. [name] Primary Care Provider: [name] Other Clinician: Referring Provider: Treating Provider/Extender: Louann Sjogren in Treatment: 0 Information  Obtained From Patient Constitutional Symptoms (General Health) Medical History: Past Medical History Notes: hypothyroidism Eyes Medical History: Positive for: Cataracts - both eyes Negative for: Glaucoma; Optic Neuritis Ear/Nose/Mouth/Throat Wingerter, Celinda M ([id number]) [id number] Page 12  of 14 Medical History: Negative for: Chronic sinus problems/congestion; Middle ear problems Hematologic/Lymphatic Medical History: Positive for: Lymphedema Negative for: Anemia; Hemophilia; Human Immunodeficiency Virus; Sickle Cell Disease Respiratory Medical History: Positive for: Asthma Negative for: Aspiration; Chronic Obstructive Pulmonary Disease (COPD); Pneumothorax; Sleep Apnea; Tuberculosis Cardiovascular Medical History: Positive for: Congestive Heart Failure; Coronary Artery Disease; Hypertension; Peripheral Arterial Disease; Peripheral Venous Disease Negative for: Angina; Arrhythmia; Hypotension; Myocardial Infarction; Phlebitis; Vasculitis Past Medical History Notes: cardiac stents Gastrointestinal Medical History: Negative for: Hepatitis A; Hepatitis B; Hepatitis C Past Medical History Notes: GERD Endocrine Medical History: Positive for: Type II Diabetes Negative for: Type I Diabetes Past Medical History Notes: Hypothyroidism Time with diabetes: since 2002 Treated with: Insulin, Oral agents Blood sugar tested every day: Yes Tested : 2-3 times a day Genitourinary Medical History: Negative for: End Stage Renal Disease Immunological Medical History: Negative for: Lupus Erythematosus; Raynauds; Scleroderma 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 Integumentary (Skin) Medical History: Negative for: History of Burn Musculoskeletal Medical History: Positive for: Rheumatoid Arthritis Negative for: Gout; Osteoarthritis; Osteomyelitis Past Medical History Notes: Fibromyalgia,  Scoliosis, Neurologic Medical History: Positive for: Neuropathy Negative for: Dementia; Quadriplegia; Paraplegia; Seizure Disorder Oncologic Medical History: Negative for: Received Chemotherapy; Received Radiation Psychiatric Tesmer, Loreli M ([id number]) [id number] Page 13 of 14 Medical History: Negative for: Anorexia/bulimia; Confinement Anxiety HBO Extended History Items Eyes: Cataracts Immunizations Pneumococcal Vaccine: Received Pneumococcal Vaccination: Yes Received Pneumococcal Vaccination On or After 60th Birthday: Yes Implantable Devices None Hospitalization / Surgery History Type of Hospitalization/Surgery 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 Family and Social History Cancer: No; Diabetes: Yes - Mother; Heart Disease: Yes - Mother,Father,Siblings; Hereditary Spherocytosis: No; Hypertension: Yes - Mother,Father,Siblings; Kidney Disease: No; Lung Disease: Yes - Father; Seizures: No; Stroke: Yes - Mother; Thyroid Problems: No; Tuberculosis: No; Former smoker - quit 15 years ago; Marital Status - Married; Alcohol Use: Never; Drug Use: No History; Caffeine Use: Rarely; Financial Concerns: No; Food, Clothing or Shelter Needs: No; Support System Lacking: No; Transportation Concerns: No Electronic Signature(s) Signed: 07/19/2022 10:25:58 AM By: Dellie Catholic RN Signed: 07/19/2022 12:55:38 PM By: Fredirick Maudlin MD FACS Entered By: Dellie Catholic on 07/19/2022 08:28:00 -------------------------------------------------------------------------------- SuperBill Details Patient Name: Date of Service: Stangl, [name] 07/19/2022 Medical Record Number: [id number] Patient Account Number: [id number] Date of Birth/Sex: Treating RN: 11-Oct-1960 (61 y.o. 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 L97.822 Non-pressure chronic ulcer of other part of left lower leg with fat layer exposed M86.9 Osteomyelitis, unspecified E11.621 Type 2 diabetes mellitus with foot ulcer E66.01 Morbid (severe) obesity due to excess calories M35.00 Sjogren syndrome, unspecified I73.9 Peripheral vascular disease, unspecified G89.16 Chronic diastolic (congestive) heart failure 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 Facility Procedures : CPT4 Code: [id number] Description: 99214 - WOUND CARE VISIT-LEV 4 EST PT Modifier: 25 Quantity: 1 : Yeater, D CPT4 Code: [id number] [place] M ([id number]) ICD- Description: 11042 - DEB SUBQ TISSUE 20 SQ CM/< ICD-10 Diagnosis Description [phone] Diagnosis Description L97.822 Non-pressure chronic ulcer of other part of left lower leg with fat layer expose Modifier: [id number] d Quantity: 1 .pdf Page 14 of 14 : 3 CPT4 Code: [id number] 11 IC Description: 043 - DEB MUSC/FASCIA 20 SQ CM/< D-10 Diagnosis  Description 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 Modifier: 1 Quantity: : 3 CPT4 Code: [id number] 11 IC Description: 046 - DEB MUSC/FASCIA EA ADDL 20 CM D-10 Diagnosis Description 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 Modifier: 1 Quantity: Physician Procedures : CPT4 Code Description Modifier [id number] 99214 - WC PHYS LEVEL 4 - EST PT 25 ICD-10 Diagnosis Description 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 L97.822 Non-pressure chronic ulcer of other part of left lower  leg with fat layer exposed E11.621 Type 2 diabetes mellitus with foot ulcer I50.27 Chronic diastolic (congestive) heart failure Quantity: 1 : [id number] 11042 - WC PHYS SUBQ TISS 20 SQ CM ICD-10  Diagnosis Description L97.822 Non-pressure chronic ulcer of other part of left lower leg with fat layer exposed Quantity: 1 : [id number] 11043 - WC PHYS DEBR MUSCLE/FASCIA 20 SQ CM ICD-10 Diagnosis Description 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

## 2022-07-20 ENCOUNTER — Ambulatory Visit: Payer: Commercial Managed Care - HMO | Admitting: Pharmacist

## 2022-07-20 DIAGNOSIS — I739 Peripheral vascular disease, unspecified: Secondary | ICD-10-CM

## 2022-07-20 DIAGNOSIS — E11621 Type 2 diabetes mellitus with foot ulcer: Secondary | ICD-10-CM

## 2022-07-20 DIAGNOSIS — E1165 Type 2 diabetes mellitus with hyperglycemia: Secondary | ICD-10-CM

## 2022-07-20 DIAGNOSIS — I2510 Atherosclerotic heart disease of native coronary artery without angina pectoris: Secondary | ICD-10-CM

## 2022-07-20 DIAGNOSIS — M791 Myalgia, unspecified site: Secondary | ICD-10-CM

## 2022-07-20 NOTE — Progress Notes (Addendum)
Pharmacy Note  07/20/2022 Name: [name] MRN: [id number] DOB: Jan 15, 1961  Subjective: [name] is a 61 y.o. year old female who is a primary care patient of [name] Pal, DO. Clinical Pharmacist Practitioner referral was placed to assist with medication management.    Engaged with patient by telephone for follow up visit today.  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 She has had right foot amputated and currently has a severe ulcer on the left foot. Seeing wound care for ulcer.  Patient is currently using [place] 10mg  daily, metformin ER 500mg  - take 4 tablets = 2000mg  daily and U500 insulin - 5 to 50 units 2 to 3 times a day. Patient reports she is using a sliding scale based on her blood glucose Blood glucose 70 to 80 - no insulin Blood glucose 80 to 110 - 5 units Blood glucose 111 to 120 - 10 units Blood glucose 121 to 130 - 15 units Blood glucose 131 to 150 - 20 units Blood glucose 151 to 175 - 25 units Blood glucose 176 to 200 - 30 units Blood glucose 201 to 225 - 35 units Blood glucose 226 to 250 - 40 units Blood glucose 251 to 275 - 45 units Blood glucose > 275 - 50 units  She has tried Victoza in past but cause severe, debilitating nausea. Discussed other GLP1s in past but she has been hesitant to try in the past due to side effects she experienced with Victoza.   She reports that over the last month blood glucose has improved - usually 100 to 200. She feels that her last A1c was elevated due to infection and because she was missing her mid day dose. She reports she only misses doses when blood glucose is < 80.  Hyperlipidemia with intolerance to statins. She also stopped ezetimibe because it was causing nausea. No current lipid lowering therapy. Patient has history of CAD with angioplasty and PVD. She has been referred to lipids clinic in past to discuss PCSK9 therapy but she has declined to schedule appointment.    Patient has notation of past myalgias / muscle pain with statins - rosuvastatin (tried in 2014 and 2016), lovastatin, pravastatin and atorvastatin (tired in 2014). She also had rash with Niaspan and anaphylaxis with Fish Oil.  Patient has diagnosis of mixed connective tissue disease which might make her more sensitive to myalgias with statins.     Objective: Review of patient status, including review of consultants reports, laboratory and other test data, was performed as part of comprehensive evaluation and provision of chronic care management services.   Lab Results  Component Value Date   CREATININE 0.66 07/05/2022   CREATININE 0.70 06/19/2022   CREATININE 0.84 06/17/2022    Lab Results  Component Value Date   HGBA1C 9.2 (H) 05/26/2022       Component Value Date/Time   CHOL 158 05/26/2022 1501   CHOL 176 05/04/2020 0947   TRIG 158 (H) 05/26/2022 1501   HDL 50 05/26/2022 1501   HDL 41 05/04/2020 0947   CHOLHDL 3.2 05/26/2022 1501   VLDL 44.6 (H) 10/27/2020 0809   LDLCALC 83 05/26/2022 1501   LDLDIRECT 121.0 10/27/2020 0809     Clinical ASCVD: Yes  The 10-year ASCVD risk score (Arnett DK, et al., 2019) is: 9%   Values used to calculate the score:     Age: 82 years     Sex: Female  Is Non-Hispanic African American: No     Diabetic: Yes     Tobacco smoker: No     Systolic Blood Pressure: [id number] mmHg     Is BP treated: Yes     HDL Cholesterol: 50 mg/dL     Total Cholesterol: 158 mg/dL    BP Readings from Last 3 Encounters:  07/07/22 133/75  07/05/22 124/80  06/27/22 (!) 147/78     Allergies  Allergen Reactions   Fish Allergy Anaphylaxis    INCLUDES OMEGA 3 OILS   Fish Oil Anaphylaxis and Swelling    THROAT SWELLS INCLUDES FISH AS A CLASS   Omega-3 Fatty Acids Swelling    Throat swelling  Has tolerated Glucerna nutritional drinks    Crestor [Rosuvastatin] Other (See Comments)    Extreme joint pain, to this & other statins   Gabapentin Anxiety     Anxiety on high doses   Lovastatin Other (See Comments)    EXTREME JOINT PAIN   Metronidazole Nausea Only and Swelling    Headache and shakes Nausea and vomiting Pt tolerating IV 06/14/22   Red Yeast Rice [Cholestin] Other (See Comments)    Muscle pain and severe joint pain.    Rotigotine Swelling    (Neupro) Extreme edema    Atrovent Hfa [Ipratropium Bromide Hfa] Other (See Comments)    wheezing   Iodine Swelling   Morphine    Other Other (See Comments)    Surgical Staples causes redness/infection per patient.   Pepto-Bismol [Bismuth Subsalicylate] Nausea And Vomiting    Extreme vomiting   Victoza [Liraglutide]     Unsure of reaction    Enalapril Cough   Suprep [Na Sulfate-K Sulfate-Mg Sulf] Nausea And Vomiting   Tape Other (See Comments)    Electrodes causes skin breakdown    Medications Reviewed Today     Reviewed by [name], CMA (Certified Medical Assistant) on 07/07/22 at [phone]  Med List Status: <None>   Medication Order Taking? Sig Documenting Provider Last Dose Status Informant  acetaminophen (TYLENOL) 500 MG tablet [id number] Yes Take 500 mg by mouth every 8 (eight) hours as needed for mild pain. [provider] Taking Active Self  albuterol (PROVENTIL) (2.5 MG/3ML) 0.083% nebulizer solution [id number] Yes USE 1 VIAL IN NEBULIZER EVERY 4 HOURS AS NEEDED FOR WHEEZING FOR SHORTNESS OF BREATH  Patient taking differently: 2.5 mg every 4 (four) hours as needed for wheezing or shortness of breath.   [name] Pal, DO Taking Active Self  amoxicillin-clavulanate (AUGMENTIN) 875-125 MG tablet [id number] Yes Take 1 tablet by mouth every 12 (twelve) hours. [name] Coss, MD Taking Active   ascorbic acid (VITAMIN C) 500 MG tablet [id number] Yes Take 1 tablet (500 mg total) by mouth 2 (two) times daily.  Patient taking differently: Take 500 mg by mouth daily.   [name] Leriche, PA-C Taking Active Self  aspirin EC 81 MG tablet [id number] Yes Take 1 tablet  (81 mg total) by mouth every evening. [name] Pal, DO Taking Active Self  augmented betamethasone dipropionate (DIPROLENE AF) 0.05 % cream [id number] Yes Apply topically 2 (two) times daily.  Patient taking differently: Apply 1 Application topically 2 (two) times daily as needed (rash).   [name] Pal, DO Taking Active Self  dapagliflozin propanediol (FARXIGA) 10 MG TABS tablet [id number] Yes Take 1 tablet (10 mg total) by mouth daily before breakfast. [name] Pal, DO Taking Active Self  diclofenac Sodium (VOLTAREN) 1 % GEL [id number] Yes Apply 2 g topically 4 (  four) times daily. Bayard Hugger, NP Taking Active   DILT-XR 240 MG 24 hr capsule [id number] Yes Take 1 capsule (240 mg total) by mouth daily.  Patient taking differently: Take 240 mg by mouth at bedtime.   Sueanne Margarita, MD Taking Active Self  doxycycline (VIBRA-TABS) 100 MG tablet [id number] Yes Take 1 tablet (100 mg total) by mouth every 12 (twelve) hours. [name] Coss, MD Taking Active   EPINEPHrine (EPIPEN 2-PAK) 0.3 mg/0.3 mL IJ SOAJ injection [id number] Yes USE AS DIRECTED FOR SEVERE ALLERGIC REACTION. [name] Pal, DO Taking Active Self  ferrous gluconate (FERGON) 324 MG tablet [id number] Yes Take 1 tablet (324 mg total) by mouth daily with breakfast. [name] Coss, MD Taking Active   fluticasone (FLONASE) 50 MCG/ACT nasal spray [id number] Yes USE 2 SPRAY(S) IN EACH NOSTRIL ONCE DAILY AS NEEDED FOR ALLERGIES OR  RHINITIS  Patient taking differently: Place 2 sprays into both nostrils daily as needed for allergies or rhinitis.   [name] Pal, DO Taking Active Self  furosemide (LASIX) 20 MG tablet [id number] Yes Take 2 tablets by mouth once daily [name] Pal, DO Taking Active Self  gabapentin (NEURONTIN) 400 MG capsule [id number] Yes TAKE 1 CAPSULE BY MOUTH IN THE MORNING AND 1 AT NOON AND 2 AT BEDTIME  Patient taking differently: Take 400-800 mg by mouth See admin  instructions. Taking 1 capsule ( 400 mg) in the AM and at noon and 2 capsules ( 800 mg) at bedtime   [name] Pal, DO Taking Active Self  GLUCOMANNAN PO [id number] Yes Take 1,995 mg by mouth daily. [provider] Taking Active Self  glucose blood test strip [id number] Yes 1 each by Other route 4 (four) times daily. Contour Next strips Love, Ivan Anchors, PA-C Taking Active Self  guaiFENesin (MUCINEX) 600 MG 12 hr tablet [id number] Yes Take 600 mg by mouth 2 (two) times daily as needed for cough or to loosen phlegm. [provider] Taking Active Self  hydrocortisone cream 1 % [id number] Yes Apply 1 application topically 3 (three) times daily as needed for itching (minor skin irritation). [name] Leriche, PA-C Taking Active Self  hydroxychloroquine (PLAQUENIL) 200 MG tablet [id number] Yes Take 200mg  by mouth twice daily Monday through Friday only. None on Saturday or Sunday. Bo Merino, MD Taking Active Self  insulin regular human CONCENTRATED (HUMULIN R) 500 UNIT/ML injection [id number] Yes INJECT 0.08-0.3 MLS (40-150 UNITS TOTAL) INTO THE SKIN THREE TIMES DAILY WITH MEALS  Patient taking differently: Inject 6-65 Units into the skin 3 (three) times daily with meals.   [name] Pal, DO Taking Active Self  INSULIN SYRINGE 1CC/29G (B-D INSULIN SYRINGE) 29G X 1/2" 1 ML MISC [id number] Yes Use three times daily with insulin.  DX E11.9 [name] Pal, DO Taking Active Self  isosorbide mononitrate (IMDUR) 30 MG 24 hr tablet [id number] Yes Take 1 tablet by mouth once daily  Patient taking differently: Take 30 mg by mouth at bedtime.   Sueanne Margarita, MD Taking Active Self  levocetirizine (XYZAL) 5 MG tablet [id number] Yes TAKE 1 TABLET BY MOUTH ONCE DAILY IN THE EVENING  Patient taking differently: Take 5 mg by mouth every evening.   [name] Pal, DO Taking Active Self  levothyroxine (SYNTHROID) 200 MCG tablet [id number] Yes TAKE 1 TABLET BY MOUTH ONCE  DAILY BEFORE BREAKFAST  Patient taking differently: Take 200 mcg by mouth daily before breakfast.   [name] Pal, DO Taking Active Self  lidocaine (LIDODERM) 5 %  [id number] Yes Place 1 patch onto the skin every 12 (twelve) hours. Remove & Discard patch within 12 hours or as directed Bayard Hugger, NP Taking Active   metFORMIN (GLUCOPHAGE-XR) 500 MG 24 hr tablet [id number] Yes Take 2 tablets by mouth twice daily  Patient taking differently: Take 1,000 mg by mouth 2 (two) times daily with a meal.   Nani Ravens, Crosby Oyster, DO Taking Active Self  Multiple Vitamins-Minerals (WOMENS 50+ MULTI VITAMIN PO) [id number] Yes Take 1 tablet by mouth daily. [provider] Taking Active Self  mupirocin ointment (BACTROBAN) 2 % [id number] Yes Apply 1 application topically 2 (two) times daily as needed (wound care). [name] Pal, DO Taking Active Self  naloxone [facility]) nasal spray 4 mg/0.1 mL [id number] Yes Place 1 spray into the nose as needed (opoid overdose). [provider] Taking Active Self  nitroGLYCERIN (NITROSTAT) 0.4 MG SL tablet [id number] Yes DISSOLVE ONE TABLET UNDER THE TONGUE EVERY 5 MINUTES AS NEEDED FOR CHEST PAIN.  DO NOT EXCEED A TOTAL OF 3 DOSES IN 15 MINUTES  Patient taking differently: Place 0.4 mg under the tongue every 5 (five) minutes as needed for chest pain.   Sueanne Margarita, MD Taking Active Self  nystatin (MYCOSTATIN/NYSTOP) powder [id number] Yes Apply 1 Application topically 3 (three) times daily. RLE  Patient taking differently: Apply 1 Application topically 3 (three) times daily as needed (skin rash). RLE   [name] Pal, DO Taking Active Self  omeprazole (PRILOSEC) 40 MG capsule [id number] Yes Take 1 capsule by mouth once daily  Patient taking differently: Take 40 mg by mouth at bedtime.   [name] Pal, DO Taking Active Self  Oxycodone HCl 20 MG TABS [id number] Yes Take 1 tablet (20 mg total) by mouth every 8 (eight) hours  as needed. Bayard Hugger, NP Taking Active   oxymetazoline (AFRIN) 0.05 % nasal spray [id number] Yes Place 2 sprays into both nostrils 2 (two) times daily as needed for congestion. [provider] Taking Active Self  Polyethyl Glycol-Propyl Glycol (SYSTANE) 0.4-0.3 % SOLN [id number] Yes Place 1-2 drops into both eyes in the morning. [provider] Taking Active Self  pramipexole (MIRAPEX) 1 MG tablet [id number] Yes TAKE 1 TABLET BY MOUTH THREE TIMES DAILY AS NEEDED AND 1/2 (ONE-HALF) TABLET TWICE DAILY AS NEEDED FOR  BREAKTHROUGH  RESTLESS  LEG  Patient taking differently: Take 0.5-1 mg by mouth See admin instructions. Taking 1 mg three times daily and 1/2 tab (0.5 mg) twice daily as needed for restless leg.   [name] Pal, DO Taking Active Self  pregabalin (LYRICA) 50 MG capsule [id number] Yes Take 1 capsule (50 mg total) by mouth 3 (three) times daily. Izora Ribas, MD Taking Active Self  promethazine (PHENERGAN) 25 MG tablet [id number] Yes Take 1 tablet (25 mg total) by mouth every 8 (eight) hours as needed for vomiting or nausea. [name] Pal, DO Taking Active Self  saccharomyces boulardii (FLORASTOR) 250 MG capsule [id number] Yes Take 250 mg by mouth 3 (three) times daily. [provider] Taking Active Self           Med Note Antony Contras, Jenene Slicker Jan 30, 2022  3:40 PM) Only takes when she takes antibiotic  VENTOLIN HFA 108 (90 Base) MCG/ACT inhaler [id number] Yes INHALE 2 PUFFS BY MOUTH EVERY 4 HOURS AS NEEDED FOR WHEEZING AND  FOR  SHORTNESS  OF  BREATH  Patient taking differently: Inhale 2 puffs into the lungs every 4 (four) hours  as needed for wheezing or shortness of breath.   Sharlene Dory, DO Taking Active Self  Vitamin D, Ergocalciferol, (DRISDOL) 1.25 MG (50000 UNIT) CAPS capsule [id number] Yes TAKE 1 CAPSULE BY MOUTH ONCE A WEEK **TAKE  ON  FRIDAY**  Patient taking differently: Take 50,000 Units by mouth every 7 (seven) days.   **TAKE  ON  FRIDAY**   Sharlene Dory, DO Taking Active Self  Wound Dressings (ELTA DERMAL WOUND DRESSING EX) [id number] Yes Apply topically. [provider] Taking Active             Patient Active Problem List   Diagnosis Date Noted   Osteomyelitis (HCC) 07/07/2022   Medication management 07/07/2022   Diabetic ulcer of left midfoot associated with diabetes mellitus due to underlying condition, with necrosis of muscle (HCC)    Diabetic foot infection (HCC) 06/14/2022   Sacral decubitus ulcer, stage II (HCC) 12/24/2021   Class 3 obesity (HCC) 12/23/2021   Iron deficiency anemia 12/23/2021   Acute on chronic diastolic CHF (congestive heart failure) (HCC) 12/22/2021   Hyperkalemia 12/22/2021   Left leg cellulitis 12/20/2021   Peripheral vascular disease, unspecified (HCC) 10/10/2021   Dehiscence of amputation stump (HCC)    Acute blood loss anemia 06/24/2021   Postoperative wound infection 06/24/2021   UTI (urinary tract infection) 06/24/2021   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)    Bacteremia due to Enterococcus 05/28/2021   Sepsis (HCC) 05/26/2021   Digestive disorder 05/18/2021   Bilateral lower extremity edema 05/02/2021   Statin myopathy 03/04/2021   Aortic stenosis 03/04/2021   Sjogren's syndrome (HCC) 02/11/2020   Cutaneous abscess of right foot    Statin intolerance 08/16/2019   Gram-negative bacteremia 10/18/2018   Streptococcal bacteremia 10/18/2018   CAD S/P percutaneous coronary angioplasty 04/19/2018   Essential hypertension    Moderate persistent asthma 03/21/2018   NAFLD (nonalcoholic fatty liver disease)    Recurrent cellulitis of lower extremity 01/02/2018   Cellulitis of lower leg 01/02/2018   Chronic diarrhea 05/08/2017   H/O Clostridium difficile infection 05/08/2017   Rectal bleeding 05/08/2017   Hyperbilirubinemia 04/29/2016   Hyponatremia 04/29/2016   Cellulitis of right foot 03/09/2016    Allergic rhinitis due to pollen 02/15/2016   Anaphylactic reaction due to food 02/10/2016   Type 2 diabetes mellitus with hyperglycemia, with long-term current use of insulin (HCC) 02/10/2016   Gastroesophageal reflux disease without esophagitis 02/10/2016   Atopic eczema 02/10/2016   Cervical nerve root disorder 12/15/2015   Lumbar radiculopathy 12/15/2015   Daytime somnolence 11/26/2015   Venous stasis dermatitis of both lower extremities 02/11/2015   Morbid obesity (HCC) 06/19/2014   Abnormal LFTs 03/26/2014   B-complex deficiency 03/26/2014   Benign essential HTN 03/26/2014   Chronic pain associated with significant psychosocial dysfunction 03/26/2014   Diaphragmatic hernia 03/26/2014   Gastroesophageal reflux disease 03/26/2014   Hammer toe 03/26/2014   H/O neoplasm 03/26/2014   Adaptive colitis 03/26/2014   Deafness, sensorineural 03/26/2014   Fibromyalgia 01/20/2014   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/20/2014   Hyperlipidemia 11/11/2012   Mixed connective tissue disease (HCC) 11/11/2012   Hypothyroidism 11/11/2012   Uncomplicated asthma 11/11/2012   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HCC) 02/20/2012   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    Assessment / Plan: Uncontrolled DM:  Discussed U500 insulin  dosing.  Patient is interested in using Continuous Glucose Monitor if covered by her insurance. Prior authorization sent to [employer] - approved.  Copay is $0.   Hyperlipidemia - LDL goal < 55 with history of intolerance to statins due to myalgias. Also has not been able to take ezetimbe, Niacin and Fish oil Patient has stopped ezetimbe due to causing nausea.  Discussed trial of PCSK9. Patient is open to trying Repatha if covered. needs prior authorization - prior authorization started/ pending.   Follow  Up:  Face to Face appointment with care management team member scheduled for: 1 to 2 week for Continuous Glucose Monitor education.   Cherre Robins, PharmD Clinical Pharmacist [facility] [place] [facility] [phone]

## 2022-07-21 MED ORDER — DEXCOM G7 SENSOR MISC: 0 refills | Status: DC

## 2022-07-21 NOTE — Progress Notes (Unsigned)
This encounter was created in error - please disregard.

## 2022-07-26 ENCOUNTER — Ambulatory Visit: Payer: Commercial Managed Care - HMO | Admitting: Internal Medicine

## 2022-07-26 ENCOUNTER — Encounter: Payer: Self-pay | Admitting: Internal Medicine

## 2022-07-26 ENCOUNTER — Other Ambulatory Visit: Payer: Self-pay

## 2022-07-26 VITALS — BP 132/79 | HR 88 | Temp 97.8°F | Wt 309.0 lb

## 2022-07-26 DIAGNOSIS — M869 Osteomyelitis, unspecified: Secondary | ICD-10-CM | POA: Diagnosis not present

## 2022-07-26 DIAGNOSIS — L089 Local infection of the skin and subcutaneous tissue, unspecified: Secondary | ICD-10-CM

## 2022-07-26 DIAGNOSIS — Z794 Long term (current) use of insulin: Secondary | ICD-10-CM

## 2022-07-26 DIAGNOSIS — E1165 Type 2 diabetes mellitus with hyperglycemia: Secondary | ICD-10-CM

## 2022-07-26 DIAGNOSIS — E11628 Type 2 diabetes mellitus with other skin complications: Secondary | ICD-10-CM

## 2022-07-26 NOTE — Assessment & Plan Note (Addendum)
Some healing and she remains hopeful.  I did discuss that I am still concerned with the open ulcer and complications.  She will continue with wound care. There is no further role for antibiotics at this time so will stop.

## 2022-07-26 NOTE — Assessment & Plan Note (Signed)
I reinforced the need to maintain excellent diabetes care.

## 2022-07-26 NOTE — Progress Notes (Signed)
   Subjective:    Patient ID: [name], female    DOB: [birth year], 61 y.o.   MRN: [id number]  HPI [name] is here for follow up of a foot ulcer with bone exposure. She has a history of a left diabetic foot ulcer with osteomyelitis and exposed bone and evaluated in the hospital in October and amputation was recommended.  She refused and so has been on doxycyline and Augmentin now for about 6 weeks.  She is also seeing wound care with Dr. Lady Gary and there is some tissue growing over the bone.  She is hopeful with the current progress.      Review of Systems  Constitutional:  Negative for chills and fever.  Gastrointestinal:  Negative for diarrhea.  Skin:  Negative for rash.       Objective:   Physical Exam Musculoskeletal:     Comments: Foot ulcer with no current exposed bone. Good granulation tissue, no surrounding erythema, no purulence.             Assessment & Plan:

## 2022-07-26 NOTE — Assessment & Plan Note (Signed)
Treated now as above.  No further antibiotics indicated.

## 2022-07-28 ENCOUNTER — Telehealth: Payer: Self-pay | Admitting: Family Medicine

## 2022-07-28 ENCOUNTER — Encounter (HOSPITAL_BASED_OUTPATIENT_CLINIC_OR_DEPARTMENT_OTHER): Payer: Commercial Managed Care - HMO | Admitting: General Surgery

## 2022-07-28 DIAGNOSIS — E11621 Type 2 diabetes mellitus with foot ulcer: Secondary | ICD-10-CM | POA: Diagnosis not present

## 2022-07-28 NOTE — Telephone Encounter (Signed)
Pt called wanting to speak to Tammy on their previous conversation. Tammy was not in the office and advised pt a message would be sent back to give her a call next week. Pt acknowledged understanding.

## 2022-07-29 NOTE — Progress Notes (Signed)
Zell, Karma M ([id number]) [id number] Page 1 of 9 Visit Report for 07/28/2022 Arrival Information Details Patient Name: Date of Service: [name], [name] 07/28/2022 10:15 A M Medical Record Number: [id number] Patient Account Number: [id number] Date of Birth/Sex: Treating RN: 01-Feb-1961 (61 y.o. [name] Primary Care Simona Rocque: [name] Other Clinician: Referring Emit Kuenzel: Treating Lattie Riege/Extender: Louann Sjogren in Treatment: 1 Visit Information History Since Last Visit Added or deleted any medications: No Patient Arrived: Wheel Chair Any new allergies or adverse reactions: No Arrival Time: 10:53 Had a fall or experienced change in No Accompanied By: spouse activities of daily living that may affect Transfer Assistance: None risk of falls: Patient Identification Verified: Yes Signs or symptoms of abuse/neglect since last visito No Hospitalized since last visit: No Implantable device outside of the clinic excluding No cellular tissue based products placed in the center since last visit: Has Dressing in Place as Prescribed: Yes Pain Present Now: Yes Electronic Signature(s) Signed: 07/28/2022 5:20:24 PM By: Dellie Catholic RN Entered By: Dellie Catholic on 07/28/2022 10:55:02 -------------------------------------------------------------------------------- Encounter Discharge Information Details Patient Name: Date of Service: [name], [name] 07/28/2022 10:15 A M Medical Record Number: [id number] Patient Account Number: [id number] Date of Birth/Sex: Treating RN: 02-02-1961 (61 y.o. [name] Primary Care Saleah Rishel: [name] Other Clinician: Referring Izea Livolsi: Treating Corlene Sabia/Extender: Louann Sjogren in Treatment: 1 Encounter Discharge Information Items Post Procedure Vitals Discharge Condition: Stable Temperature (F): 98.4 Ambulatory Status:  Wheelchair Pulse (bpm): 92 Discharge Destination: Home Respiratory Rate (breaths/min): 18 Transportation: Private Auto Blood Pressure (mmHg): 139/64 Accompanied By: spouse Schedule Follow-up Appointment: Yes Clinical Summary of Care: Patient Declined Electronic Signature(s) Signed: 07/28/2022 5:20:24 PM By: Dellie Catholic RN Entered By: Dellie Catholic on 07/28/2022 16:47:24 Beehler, Marcel M ([id number]) [id number] Page 2 of 9 -------------------------------------------------------------------------------- Lower Extremity Assessment Details Patient Name: Date of Service: [name], [name] 07/28/2022 10:15 A M Medical Record Number: [id number] Patient Account Number: [id number] Date of Birth/Sex: Treating RN: 01-16-1961 (61 y.o. [name] Primary Care Kelcey Korus: [name] Other Clinician: Referring Guage Efferson: Treating Kitana Gage/Extender: Louann Sjogren in Treatment: 1 Edema Assessment Assessed: [Left: No] [Right: No] Edema: [Left: Ye] [Right: s] Calf Left: Right: Point of Measurement: 30 cm From Medial Instep 45.5 cm Ankle Left: Right: Point of Measurement: 9 cm From Medial Instep 24.5 cm Vascular Assessment Pulses: Dorsalis Pedis Palpable: [Left:Yes] Electronic Signature(s) Signed: 07/28/2022 5:20:24 PM By: Dellie Catholic RN Entered By: Dellie Catholic on 07/28/2022 11:18:30 -------------------------------------------------------------------------------- Multi Wound Chart Details Patient Name: Date of Service: [name], [name] 07/28/2022 10:15 A M Medical Record Number: [id number] Patient Account Number: [id number] Date of Birth/Sex: Treating RN: 03-12-1961 (61 y.o. 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 Etiology: Extremity Cataracts, Lymphedema, Asthma, Cataracts, Lymphedema, Asthma, N/A Comorbid History: Congestive Heart Failure, Coronary Congestive Heart Failure, Coronary Artery Disease, Hypertension, Artery Disease, Hypertension, Peripheral Arterial Disease, Peripheral Peripheral Arterial Disease, Peripheral Venous Disease, Type II Diabetes, Venous Disease, Type II Diabetes, Rheumatoid Arthritis, Neuropathy Rheumatoid Arthritis, Neuropathy 07/17/2022 05/17/2022 N/A Date Acquired: 1 1 N/A Weeks of Treatment: Healed - Epithelialized  Open N/A Wound Status: No No N/A Wound Recurrence: 0x0x0 5.2x4.5x0.4 N/A Measurements L x W x D (cm) 0 18.378 N/A A (cm) : rea 0 7.351 N/A Volume (cm) : 100.00% 18.20% N/A % Reduction in A rea: 100.00% 18.20% N/A % Reduction in Volume: Partial Thickness Grade 2 N/A Classification: None Present Medium N/A Exudate A mount: N/A Serosanguineous N/A Exudate Type: N/A red, brown N/A Exudate Color: N/A Distinct, outline attached N/A Wound Margin: None Present (0%) Small (1-33%) N/A Granulation A mount: N/A Pink N/A Granulation Quality: None Present (0%) Large (67-100%) N/A Necrotic A mount: N/A Eschar, Adherent Slough N/A Necrotic Tissue: Fascia: No Fat Layer (Subcutaneous Tissue): Yes N/A Exposed Structures: Fat Layer (Subcutaneous Tissue): No Fascia: No Tendon: No Tendon: No Muscle: No Muscle: No Joint: No Joint: No Bone: No Bone: No Large (67-100%) None N/A Epithelialization: N/A Debridement - Excisional N/A Debridement: Pre-procedure Verification/Time Out N/A  11:20 N/A Taken: N/A Lidocaine 5% topical ointment N/A Pain Control: N/A Muscle, Subcutaneous, Slough N/A Tissue Debrided: N/A Skin/Subcutaneous Tissue/Muscle N/A Level: N/A 23.4 N/A Debridement A (sq cm): rea N/A Curette N/A Instrument: N/A Minimum N/A Bleeding: N/A Pressure N/A Hemostasis A chieved: N/A 0 N/A Procedural Pain: N/A 0 N/A Post Procedural Pain: N/A Procedure was tolerated well N/A Debridement Treatment Response: N/A 5.2x4.5x0.4 N/A Post Debridement Measurements L x W x D (cm) N/A 7.351 N/A Post Debridement Volume: (cm) Excoriation: No Excoriation: No N/A Periwound Skin Texture: Induration: No Induration: No Callus: No Callus: No Crepitus: No Crepitus: No Rash: No Rash: No Scarring: No Scarring: No Maceration: No Maceration: No N/A Periwound Skin Moisture: Dry/Scaly: No Dry/Scaly: No Atrophie Blanche: No Atrophie Blanche: No N/A Periwound Skin Color: Cyanosis: No Cyanosis: No Ecchymosis: No Ecchymosis: No Erythema: No Erythema: No Hemosiderin Staining: No Hemosiderin Staining: No Mottled: No Mottled: No Pallor: No Pallor: No Rubor: No Rubor: No No Abnormality No Abnormality N/A Temperature: N/A Debridement N/A Procedures Performed: Treatment Notes Electronic Signature(s) Signed: 07/28/2022 12:05:34 PM By: Fredirick Maudlin MD FACS Entered By: Fredirick Maudlin on 07/28/2022 12:05:34 Revuelta, [name] ([id number]) [id number] Page 4 of 9 -------------------------------------------------------------------------------- Multi-Disciplinary Care Plan Details Patient Name: Date of Service: Abate, [name] 07/28/2022 10:15 A M Medical Record Number: [id number] Patient Account Number: [id number] Date of Birth/Sex: Treating RN: 04-01-1961 (61 y.o. [name] Primary Care Kamela Blansett: [name] Other Clinician: Referring Vaanya Shambaugh: Treating Taurus Alamo/Extender: Louann Sjogren in Treatment: 1 Active Inactive Venous Leg Ulcer Nursing Diagnoses: Potential for venous Insuffiency (use before diagnosis confirmed) Goals: Patient will maintain optimal edema control Date Initiated: 07/19/2022 Target Resolution Date: 09/10/2022 Goal Status: Active Interventions: Assess peripheral edema status every visit. Treatment Activities: Therapeutic compression applied : 07/19/2022 Notes: Electronic Signature(s) Signed: 07/28/2022 5:20:24 PM By: Dellie Catholic RN Entered By: Dellie Catholic on 07/28/2022 11:26:28 -------------------------------------------------------------------------------- Pain Assessment Details Patient Name: Date of Service: [name], [name] 07/28/2022 10:15 A M Medical Record Number: [id number] Patient Account Number: [id number] Date of Birth/Sex: Treating RN: 07-03-1961 (61 y.o. [name] Primary Care Zema Lizardo: [name] Other Clinician: Referring Janace Decker: Treating Lavel Rieman/Extender: Louann Sjogren in Treatment: 1 Active Problems Location of Pain Severity and Description of Pain Patient Has Paino Yes Site Locations Pain Location: Flegal, [name] ([id number]) [id number] Page 5 of 9 Pain Location: Generalized Pain With Dressing Change: Yes Duration of the Pain. Constant / Intermittento Constant Rate the pain. Current Pain Level: 7 Worst Pain Level: 10 Least Pain Level: 7 Tolerable Pain Level: 7 Character  of Pain Describe the Pain: Difficult to Pinpoint Pain Management and Medication Current Pain Management: Medication: Yes Cold Application: No Rest: Yes Massage: No Activity: No T.E.N.S.: No Heat Application: No Leg drop or elevation: No Is the Current Pain Management Adequate: Adequate How does your wound impact your activities of daily livingo Sleep: No Bathing: No Appetite: No Relationship With Others: No Bladder Continence:  No Emotions: No Bowel Continence: No Work: No Toileting: No Drive: No Dressing: No Hobbies: No Electronic Signature(s) Signed: 07/28/2022 5:20:24 PM By: Dellie Catholic RN Entered By: Dellie Catholic on 07/28/2022 11:00:03 -------------------------------------------------------------------------------- Patient/Caregiver Education Details Patient Name: Date of Service: [name], [name] 11/17/2023andnbsp10:15 A M Medical Record Number: [id number] Patient Account Number: [id number] Date of Birth/Gender: Treating RN: 08/12/1961 (61 y.o. [name] Primary Care Physician: [name] Other Clinician: Referring Physician: Treating Physician/Extender: Louann Sjogren in Treatment: 1 Education Assessment Education Provided To: Patient Education Topics Provided Wound/Skin Impairment: Methods: Explain/Verbal Responses: Return demonstration correctly Electronic Signature(s) Signed: 07/28/2022 5:20:24 PM By: Dellie Catholic RN Sarafian, Reeder ([id number]) [id number] Page 6 of 9 Entered By: Dellie Catholic on 07/28/2022 11:26:57 -------------------------------------------------------------------------------- Wound Assessment Details Patient Name: Date of Service: [name], [name] 07/28/2022 10:15 A M Medical Record Number: [id number] Patient Account Number: [id number] Date of Birth/Sex: Treating RN: 1960-12-04 (61 y.o. [name] Primary Care Joaovictor Krone: [name] Other Clinician: Referring Kaj Vasil: Treating Tyrelle Raczka/Extender: Louann Sjogren in Treatment: 1 Wound Status Wound Number: [id number] Primary Venous Leg Ulcer Etiology: Wound Location: Left, Anterior Lower Leg Wound Healed - Epithelialized Wounding Event: Trauma Status: Date Acquired: 07/17/2022 Comorbid Cataracts, Lymphedema, Asthma, Congestive Heart Failure, Weeks Of Treatment: 1 History: Coronary Artery Disease,  Hypertension, Peripheral Arterial Disease, Clustered Wound: No Peripheral Venous Disease, Type II Diabetes, Rheumatoid Arthritis, Neuropathy Photos Wound Measurements Length: (cm) Width: (cm) Depth: (cm) Area: (cm) Volume: (cm) 0 % Reduction in Area: 100% 0 % Reduction in Volume: 100% 0 Epithelialization: Large (67-100%) 0 Tunneling: No 0 Undermining: No Wound Description Classification: Partial Thickness Exudate Amount: None Present Foul Odor After Cleansing: No Slough/Fibrino No Wound Bed Granulation Amount: None Present (0%) Exposed Structure Necrotic Amount: None Present (0%) Fascia Exposed: No Fat Layer (Subcutaneous Tissue) Exposed: No Tendon Exposed: No Muscle Exposed: No Joint Exposed: No Bone Exposed: No Periwound Skin Texture Texture Color No Abnormalities Noted: Yes No Abnormalities Noted: No Atrophie Blanche: No Moisture Cyanosis: No No Abnormalities Noted: Yes Ecchymosis: No Erythema: No Hemosiderin Staining: No Mottled: No Pallor: No Rubor: No Gutknecht, [place] M ([id number]) [id number] Page 7 of 9 Temperature / Pain Temperature: No Abnormality Electronic Signature(s) Signed: 07/28/2022 5:20:24 PM By: Dellie Catholic RN Entered By: Dellie Catholic on 07/28/2022 11:20:27 -------------------------------------------------------------------------------- Wound Assessment Details Patient Name: Date of Service: [name], [name] 07/28/2022 10:15 A M Medical Record Number: [id number] Patient Account Number: [id number] Date of Birth/Sex: Treating RN: 13-Dec-1960 (61 y.o. [name] Primary Care Kiaja Shorty: [name] Other Clinician: Referring Joneisha Miles: Treating Enrique Manganaro/Extender: Louann Sjogren in Treatment: 1 Wound Status Wound Number: 19 Primary Diabetic Wound/Ulcer of the Lower Extremity Etiology: Wound Location: Left, Plantar Foot Wound Open Wounding Event: Gradually  Appeared Status: Date Acquired: 05/17/2022 Comorbid Cataracts, Lymphedema, Asthma, Congestive Heart Failure, Weeks Of Treatment: 1 History: Coronary Artery Disease, Hypertension, Peripheral Arterial Disease, Clustered Wound: No Peripheral Venous Disease, Type II Diabetes, Rheumatoid Arthritis, Neuropathy Photos Wound Measurements Length: (cm) 5.2 Width: (cm) 4.5 Depth: (cm) 0.4 Area: (cm) 18.378 Volume: (cm) 7.351 % Reduction in  Area: 18.2% % Reduction in Volume: 18.2% Epithelialization: None Tunneling: No Undermining: No Wound Description Classification: Wound Margin: Exudate Amount: Exudate Type: Exudate Color: Grade 2 Distinct, outline attached Medium Serosanguineous red, brown Wound Bed Granulation Amount: Small (1-33%) Exposed Structure Granulation Quality: Pink Fascia Exposed: No Necrotic Amount: Large (67-100%) Fat Layer (Subcutaneous Tissue) Exposed: Yes Necrotic Quality: Eschar, Adherent Slough Tendon Exposed: No Muscle Exposed: No Joint Exposed: No Bone Exposed: No Periwound Skin Texture Texture Color No Abnormalities Noted: No No Abnormalities Noted: No [name], [name] M ([id number]) [id number] Page 8 of 9 Callus: No Atrophie Blanche: No Crepitus: No Cyanosis: No Excoriation: No Ecchymosis: No Induration: No Erythema: No Rash: No Hemosiderin Staining: No Scarring: No Mottled: No Pallor: No Moisture Rubor: No No Abnormalities Noted: No Dry / Scaly: No Temperature / Pain Maceration: No Temperature: No Abnormality Treatment Notes Wound #19 (Foot) Wound Laterality: Plantar, Left Cleanser Soap and Water Discharge Instruction: May shower and wash wound with dial antibacterial soap and water prior to dressing change. Wound Cleanser Discharge Instruction: Cleanse the wound with wound cleanser prior to applying a clean dressing using gauze sponges, not tissue or cotton balls. [employer]ram Ancillary Kit - 15 Day  Supply Discharge Instruction: Use supplies as instructed; Kit contains: (15) Saline Bullets; (15) 3x3 Gauze; 15 pr Gloves Peri-Wound Care Topical Primary Dressing KerraCel Ag Gelling Fiber Dressing, 2x2 in (silver alginate) Discharge Instruction: Apply silver alginate to wound bed as instructed Secondary Dressing Optifoam Non-Adhesive Dressing, 4x4 in Discharge Instruction: Apply over primary dressing as directed. Secured With Elastic Bandage 4 inch (ACE bandage) Discharge Instruction: Secure with ACE bandage as directed. Kerlix Roll Sterile, 4.5x3.1 (in/yd) Discharge Instruction: Secure with Kerlix as directed. 52M Medipore Soft Cloth Surgical T 2x10 (in/yd) ape Discharge Instruction: Secure with tape as directed. Compression Wrap Compression Stockings Add-Ons Electronic Signature(s) Signed: 07/28/2022 5:20:24 PM By: Dellie Catholic RN Entered By: Dellie Catholic on 07/28/2022 11:05:21 -------------------------------------------------------------------------------- Vitals Details Patient Name: Date of Service: Burry, [name] 07/28/2022 10:15 A M Medical Record Number: [id number] Patient Account Number: [id number] Date of Birth/Sex: Treating RN: 10/22/1960 (61 y.o. [name] Primary Care Kenyah Luba: [name] Other Clinician: Referring Chava Dulac: Treating Caci Orren/Extender: Louann Sjogren in Treatment: 1 Vital Signs Time Taken: 10:55 Temperature (F): 98.4 Height (in): 62 Pulse (bpm): 92 Plantz, Orel M ([id number]) [id number] Page 9 of 9 Weight (lbs): 284 Respiratory Rate (breaths/min): 18 Body Mass Index (BMI): 51.9 Blood Pressure (mmHg): 139/64 Reference Range: 80 - 120 mg / dl Electronic Signature(s) Signed: 07/28/2022 5:20:24 PM By: Dellie Catholic RN Entered By: Dellie Catholic on 07/28/2022 10:59:23

## 2022-07-29 NOTE — Progress Notes (Signed)
[name] [name], [name] [name] ([id number]) [id number] Page 1 of 12 Visit Report for 07/28/2022 Chief Complaint Document Details Patient Name: Date of Service: [name] [name] [name]. 07/28/2022 10:15 A [name] Medical Record Number: [id number] Patient Account Number: [id number] Date of Birth/Sex: Treating RN: 1961/04/22 (61 y.o. F) Primary Care Provider: Riki [name] Other Clinician: Referring Provider: Treating Provider/Extender: [name] [name] in Treatment: 1 Information Obtained from: Patient Chief Complaint 10/17/2021; multiple scattered wounds to the abdomen, posterior left leg, left labia and sacrum 07/19/2022: left foot DFU Electronic Signature(s) Signed: 07/28/2022 12:05:41 PM By: [name] Maudlin MD FACS Entered By: [name] [name] on 07/28/2022 12:05:41 -------------------------------------------------------------------------------- Debridement Details Patient Name: Date of Service: [name] [name], [name] [name]. 07/28/2022 10:15 A [name] Medical Record Number: [id number] Patient Account Number: [id number] Date of Birth/Sex: Treating RN: 12-22-1960 (61 y.o. [name] Primary Care Provider: Riki [name] Other Clinician: Referring Provider: Treating Provider/Extender: [name] [name] in Treatment: 1 Debridement Performed for Assessment: Wound #19 Max Performed By: Physician [name] Maudlin, MD Debridement Type: Debridement Severity of Tissue Pre Debridement: Fat layer exposed Level of Consciousness (Pre-procedure): Awake and Alert Pre-procedure Verification/Time Out Yes - 11:20 Taken: Start Time: 11:20 Pain Control: Lidocaine 5% topical ointment T Area Debrided (L x W): otal 5.2 (cm) x 4.5 (cm) = 23.4 (cm) Tissue and other material debrided: Non-Viable, Muscle, Slough, Subcutaneous, Slough Level: Skin/Subcutaneous Tissue/Muscle Debridement Description: Excisional Instrument: Curette Bleeding:  Minimum Hemostasis Achieved: Pressure End Time: 11:21 Procedural Pain: 0 Post Procedural Pain: 0 Response to Treatment: Procedure was tolerated well Level of Consciousness (Post- Awake and Alert procedure): Post Debridement Measurements of Total Wound Length: (cm) 5.2 Width: (cm) 4.5 Depth: (cm) 0.4 Volume: (cm) 7.351 [name] [name], [name] [name] ([id number]) [id number] Page 2 of 12 Character of Wound/Ulcer Post Debridement: Improved Severity of Tissue Post Debridement: Fat layer exposed Post Procedure Diagnosis Same as Pre-procedure Notes Scribed for Dr. Celine [name] by [name] [name] Electronic Signature(s) Signed: 07/28/2022 12:43:40 PM By: [name] Maudlin MD FACS Signed: 07/28/2022 5:20:24 PM By: [name] Catholic RN Entered By: [name] [name] on 07/28/2022 11:24:26 -------------------------------------------------------------------------------- HPI Details Patient Name: Date of Service: [name] [name]. 07/28/2022 10:15 A [name] Medical Record Number: [id number] Patient Account Number: [id number] Date of Birth/Sex: Treating RN: [birth year] (61 y.o. F) Primary Care Provider: Riki [name] Other Clinician: Referring Provider: Treating Provider/Extender: [name] [name] in Treatment: 1 History of Present Illness HPI Description: 07/23/2019 on evaluation today patient presents with a myriad of wounds noted at multiple locations over her right heel, right lower extremity, and abdominal region. She also has bilateral lower extremity lymphedema which is quite significant as well. Incidentally she also has congestive heart failure and hypertension. She also is obese. With that being said I think all this is contributing as well to her lower extremity edema which is very much uncontrolled. It seems like its been at least several months since she is worn any compression according to what she tells me. With that being said I am not sure exactly when  that would have been. She does have fibrotic changes in the lower extremity secondary to lymphedema worse on the left than the right. She did show me pictures of wound she had on the anterior portion of her shin which was quite significant fortunately that has healed. These issues have been intermittent at this time. Again I think that with appropriate compression therapy she may actually be doing better than what we are seeing at this point but again I am not really  sure that she is ever been extremely compliant with that. Fortunately there is no signs of active infection at this time. No fever chills noted. As far as the abdominal ulcer she initially had one wound that she thinks may have been a bug bite. Subsequently she was put a dressing on this and she states that the tape pulled skin off on other locations causing other wounds that have not healed that is been about 3 months. The wounds on her lower extremities have been intermittent over 3 years. This includes the heel. 11/19; this is a patient that I have not seen previously. She was admitted to our clinic last week with multiple wounds including several superficial circular areas on her abdomen predominantly right upper quadrant. Also 1 in her umbilicus. She has an area on her right lateral malleolus which was new today. She has bilateral lower extremity edema with very significant stasis dermatitis on the left anterior tibial area. She has tightly adherent skin in her lower extremities probably secondary to cutaneous fibrosis in the area. We put her in compression last week. She comes in with the dressings reasonably saturated. 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 She has multiple areas on her arms from scratching. 12/3; the patient I saw for  the first time 2 weeks ago. She has 3 small circular areas on her abdomen 2 in the right upper quadrant one on her umbilicus. We have been using silver alginate to this area. She also had an area on her right lateral malleolus and right heel and right medial malleolus. We have been using silver alginate here under compression. She does not have an open area on the left leg today. We are going to order her stockings for the left leg. She clearly has chronic lymphedema in these areas. X-ray of the foot from 10/20 did not show any fracture or dislocation. The heel wound was noted there was no evidence of osteomyelitis She complains of generalized pruritus. 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 She has longstanding lymphedema. Although she does not have known liver disease I looked at his CT scan of the abdomen from February of this year that showed hepatic steatosis 12/11; patient still has no open area on the left leg we transitioned her into a stocking she had. Her stockings from [facility] [employer] [facility] are supposed to be arriving later today which will be the stockings of choice. The only wound remaining on the right is the right heel 2 small superficial open areas. Everything else is well on its way to healing in the right leg few small excoriations. She has 1 major area remaining on her abdomen the rest seem to be healing 12/22 patient still has no open area on the left leg and she is still in a stocking. She had still 2 open areas on the tip of her right heel. These look like pressure related areas although the patient is not really certain how this is happening. She has an open heeled shoe that we provided. Still has 1 open area on the right lateral abdomen The patient has complaints of generalized pruritus. She has multiple excoriated areas on her abdomen that of close over her arms or thighs which I think are from scratching. She tells me that she has never had a basic  work-up for  this which might include basic lab work, liver functions, kidney functions, thyroid etc. She might also benefit from a dermatologist 12/29; the patient has a deeper larger more painful wound on the tip of her right heel. Again these look like pressure ulcers although she wears an open toed heel pads or heel at night to prevent it from hitting the mattress etc. They tell me and remind me that this is been present on and off for about 2 years that has closed over but then will reopen. I did look over her lab work that was available in [place]. She does not have elevated liver function tests or creatinine. I was not able to see a TSH. This was in response to her complaints of generalized itching and a itch/scratch cycle. I also noted that she is on OxyContin and oxycodone and of course narcotics can cause generalized pruritus as a side effect Readmission: History From [name], [place] Last Week: 03/29/2021 this is a patient who presents for initial evaluation here in the clinic though I have seen her 1 time previously in 2020 this was in [place]. That was actually her initial visit therefore a heel ulceration. Subsequently that did not healing she actually saw me the 1 time in November and then subsequently Koos, Taneesha [name] ([id number]) [id number] Page 3 of 12 saw Dr. Dellia Nims through the end of December where she apparently was healed. 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 This is on the right. With that being said right now she has a basically region on the lateral portion of the ray site where she is applying more pressure especially as her foot seems to be starting to turn in and this has become an increasingly significant issue for her to be honest. She does see Dr. Sharol Given and Dr. Sharol Given has had her on doxycycline for quite a bit of time here.  Subsequently she is just now wrapping that out. I think that she probably would be benefited by continue the doxycycline for a time while we can work through what we need to do with things here. She does have evidence of osteomyelitis based on what I saw on the MRI today. This obviously is unfortunate and definitely not something that she was hoping to hear. With that being said I do believe that she would potentially be a strong candidate for hyperbaric oxygen therapy. Also believe she could be a candidate for total contact cast though we have to be cautious due to the fact that how her foot is bending inward I think that this is good to be a little bit of a concern. We will definitely have to keeping a close eye on things. She does have a history of diabetes mellitus type 2. She also other than the amputation has a medical history positive for hypertension. She has never undergone hyperbaric oxygen therapy previously I did show her the chamber we did talk a little bit about it today as well. Admission [facility]: o 04/06/2021 Patient presents here in [place] today for evaluation. Again she is establishing care here after I saw her last week in [place]. Obviously I think that she is doing extremely well at this point which is great news and in general I am extremely pleased with where things stand from the standpoint of the appearance of the wound. Obviously this is not significantly smaller but  does look a lot cleaner I think using the Hydrofera Blue has been of benefit over the past week. Nonetheless she still has a wound with issues with pressure here which I think is good to be an ongoing issue. Also think that the osteomyelitis which is chronic is also getting ongoing issue. She does want to try to do what she can to prevent this from worsening and ending with a below-knee amputation. T that end I do think that getting her into the hyperbaric oxygen chamber would be what we need to  do. She has recently been seen by cardiology o and subsequently well as far as that is concerned. Her ejection fraction was appropriate for hyperbarics and everything seems to be doing great in that regard. She has not had a recent chest x-ray she is a former smoker around 15 years ago she quit. Nonetheless I do believe with her asthma we do want to do a chest x-ray just to make sure everything is okay before proceeding with the hyperbarics although we can go ahead and see about getting the approval. I also think she is going require some sharp debridement and around this wound we also have to contact her insurance for prior approval on this as well. 04/13/2021 upon evaluation today patient appears to be doing a little bit better in regard to her leg in fact the swelling is dramatically better. Very pleased with where things stand in that regard. Fortunately there does not appear to be any signs of active infection at this time. No fevers, chills, nausea, vomiting, or diarrhea. 04/20/2021 upon evaluation today patient appears to be doing decently well in regard to her foot. There is some need for sharp debridement here today. With that being said we will get a go ahead and proceed with that today as the foot is becoming somewhat macerated with the overhanging callus that is definitely not we want to see. With that being said the patient does have an issue here as well with a boil in the perineal region unfortunately that is an issue for her today as well. I did have a look at that as well. We are still working on dealing with insurance as far as getting hyperbarics approved. 04/27/2021 upon evaluation today patient appears to be doing somewhat poorly in general compared to where she has been. At this point she is having a lot of swelling which is the main concerning thing that I am seeing. There does not appear to be any signs of infection currently which is good news but at the same time I do feel like  that she is a lot more swollen than she was even last week and is showing how much she is weeping in regard to the right lower leg. She also has a blister on the left breast although is not an open wound at this time. She continues to have the area in the perineum as well. 05/04/2021 upon evaluation today patient appears to actually be doing decently well in regard to her wound on the foot. Fortunately there is no signs of active infection at this time. No fevers, chills, nausea, vomiting, or diarrhea. Unfortunately she does have an area on the left breast which is actually appearing to be a burn based on what I see physically. She does note that she uses rice bags for areas in general and may have left one in that area not realizing it was burning her as she does not really have much feeling.  I think this is very probable based on what I am seeing. For that reason working to treat this as such I do think we need to loosen up a lot of the necrotic tissue here. I Am going tosend in a prescription for Santyl for the patient. 9/1; patient presents for HBO today but cannot do treatment due to wheezing and feeling short of breath when laying down. She was set up as a doctor visit today since she was not able to do HBO. She states she feels okay overall and started having a dry cough over the past couple days. She has not tested herself for COVID. She denies fever/chills or sputum production. She thinks her symptoms are related to allergies. She has been using silver alginate to her right plantar foot wound. She has not been using Santyl to the breast wound because she reports forgetting to do this. She uses zinc oxide to her perennial wound. She currently denies systemic signs of infection. 9/8; patient presents for follow-up. She has been a unable to do HBO treatments for the past week due to her back pain. She is currently taking Flexeril to address this issue. She reports no issues with her compression  wrap. She has been putting Santyl on the left breast wound and Monistat cream to the perennial wound. She denies signs of infection. 9/15; patient presents for follow-up. Again she is unable to do HBO treatment today due to sinus pain. She has 2 new wounds to her right foot which she is unaware of. She has been putting Santyl on the left breast wound and zinc oxide to the perineal wound. She currently denies signs of infection. Readmission 10/17/2021 Ms. Virga Haltiwanger is a 61 year old female with a past medical history of type 2 diabetes, osteomyelitis status post right BKA and morbid obesity that presents to the clinic for multiple scattered open wounds to her body. These are located to the abdomen, left posterior leg and sacral region. She has been using mupirocin ointment, Neosporin and zinc oxide to the wound beds. She has been started on Bactrim and Keflex by her primary care physician and states she has several days left to complete the course. She currently denies systemic signs of infection. READMISSION This is a 61 year old morbidly obese type II diabetic (poorly controlled with last hemoglobin A1c 9.2%). She has congestive heart failure, peripheral vascular disease, hypertension, coronary artery disease, venous stasis, connective tissue disease (o [name]'s syndrome), and bilateral lower extremity edema. She has been seen here for various reasons in the past. She has a right above-knee amputation. She was recently hospitalized for a left diabetic foot ulcer. Orthopedic surgery was consulted. An MRI was performed that was not conclusive for osteomyelitis, but given the extent of the necrosis, orthopedics recommended amputation. Infectious disease was also consulted and have recommended a 6-week course of oral doxycycline and Augmentin. Apparently wound care was also consulted while she was in the hospital and simply recommended painting the area with Betadine. She saw infectious disease on  October 27 with plans to continue doxycycline on Augmentin to continue therapy until November 14. The infectious disease provider also recommended that the patient reconsider amputation. Her PCP referred her to the wound care center, as the patient is very reluctant to undergo amputation. She also has a small ulcer on her anterior tibial surface that recently opened after a minor trauma. On exam, her left foot is rolled such that the lateral aspect of her foot is the contact surface. There is  a large ulcer with exposed necrotic muscle and fat. The wound does not probe to bone, but apparently there was bone exposure in the past while she was in the hospital. No malodor or purulent drainage. The wound on her anterior tibial surface is small with just a little slough accumulation. She has modest edema and skin changes consistent with stasis dermatitis. 07/28/2022: The anterior tibial wound is healed. The left plantar foot wound looks a little bit better today. There is significantly less necrotic tissue present. There is an area at the most caudal aspect of the wound that looks like there is ongoing pressure-induced tissue injury. Bone remains covered. Electronic Signature(s) Signed: 07/28/2022 12:06:45 PM By: [name] Maudlin MD FACS Entered By: [name] [name] on 07/28/2022 12:06:45 [name] [name], [name] [name] ([id number]) [id number] Page 4 of 12 -------------------------------------------------------------------------------- Physical Exam Details Patient Name: Date of Service: [name] [name], [name] [name]. 07/28/2022 10:15 A [name] Medical Record Number: [id number] Patient Account Number: [id number] Date of Birth/Sex: Treating RN: [birth year] (61 y.o. F) Primary Care Provider: Riki [name] Other Clinician: Referring Provider: Treating Provider/Extender: [name] [name] in Treatment: 1 Constitutional . . . . No acute distress.Marland Kitchen Respiratory Normal work of  breathing on room air.. Notes 07/28/2022: The anterior tibial wound is healed. The left plantar foot wound looks a little bit better today. There is significantly less necrotic tissue present. There is an area at the most caudal aspect of the wound that looks like there is ongoing pressure-induced tissue injury. Bone remains covered. Electronic Signature(s) Signed: 07/28/2022 12:07:35 PM By: [name] Maudlin MD FACS Entered By: [name] [name] on 07/28/2022 12:07:35 -------------------------------------------------------------------------------- Physician Orders Details Patient Name: Date of Service: [name] [name], [name] [name]. 07/28/2022 10:15 A [name] Medical Record Number: [id number] Patient Account Number: [id number] Date of Birth/Sex: Treating RN: [birth year] (61 y.o. 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 - Dr. Celine [name] Room 3 Return A Anesthetic (In clinic) Topical Lidocaine 5% applied to wound bed - In Clinic Off-Loading Other: - Try and elevate Left leg throughout the day Wound Treatment Wound #19 - Foot Wound Laterality: Plantar, Left Cleanser: Soap and Water 1 x Per Day/30 Days Brusca, [name] [name] ([id number]) [id number] Page 5 of 12 Discharge Instructions: May shower and wash wound with dial  antibacterial soap and water prior to dressing change. Cleanser: Wound Cleanser (Generic) 1 x Per Day/30 Days Discharge Instructions: Cleanse the wound with wound cleanser prior to applying a clean dressing using gauze sponges, not tissue or cotton balls. Cleanser: [place] Ancillary Kit - 15 Day Supply (Generic) 1 x Per Day/30 Days Discharge Instructions: Use supplies as instructed; Kit contains: (15) Saline Bullets; (15) 3x3 Gauze; 15 pr Gloves Prim Dressing: KerraCel Ag Gelling Fiber Dressing, 2x2 in (silver alginate) (Generic) 1 x Per Day/30 Days ary Discharge Instructions: Apply silver alginate to wound bed as instructed Secondary Dressing: Optifoam Non-Adhesive Dressing, 4x4 in (DME) (Generic) 1 x Per Day/30 Days Discharge Instructions: Apply over primary dressing as directed. Secured With: Elastic Bandage 4 inch (ACE bandage) (  Generic) 1 x Per Day/30 Days Discharge Instructions: Secure with ACE bandage as directed. Secured With: [employer], 4.5x3.1 (in/yd) (Generic) 1 x Per Day/30 Days Discharge Instructions: Secure with Kerlix as directed. Secured With: 72M Medipore Public affairs consultant Surgical T 2x10 (in/yd) (Generic) 1 x Per Day/30 Days ape Discharge Instructions: Secure with tape as directed. Electronic Signature(s) Signed: 07/28/2022 12:43:40 PM By: [name] Maudlin MD FACS Entered By: [name] [name] on 07/28/2022 12:07:47 -------------------------------------------------------------------------------- Problem List Details Patient Name: Date of Service: [name] [name], [name] [name]. 07/28/2022 10:15 A [name] Medical Record Number: [id number] Patient Account Number: [id number] Date of Birth/Sex: Treating RN: 12-01-1960 (61 y.o. 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, unspecified 07/19/2022 No Yes I73.9 Peripheral vascular disease, unspecified 07/19/2022 No Yes Woolsey, Meeah [name] ([id number]) [id number] Page 6 of 12 P95.09 Chronic diastolic (congestive) heart failure 07/19/2022 No Yes 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 07/19/2022 No Yes Inactive Problems Resolved Problems ICD-10 Code Description Active Date Resolved Date L97.822 Non-pressure chronic ulcer of other part of left lower leg with fat layer exposed 07/19/2022 07/19/2022 Electronic Signature(s) Signed: 07/28/2022 12:05:25 PM By: [name] Maudlin MD FACS Entered By: [name] [name] on 07/28/2022 12:05:25 -------------------------------------------------------------------------------- Progress Note Details Patient Name: Date of Service: [name] [name], [name] [name]. 07/28/2022 10:15 A [name] Medical Record Number: [id number] Patient Account Number: [id number] Date of Birth/Sex: Treating RN: 1961/03/13 (61 y.o. F) Primary Care Provider: Riki [name] Other Clinician: Referring Provider: Treating Provider/Extender: [name] [name] in Treatment: 1 Subjective Chief Complaint Information obtained from Patient 10/17/2021; multiple scattered wounds to the abdomen, posterior left leg, left labia and sacrum 07/19/2022: left foot DFU History of Present Illness (HPI) 07/23/2019 on evaluation today patient presents with a myriad of wounds noted at multiple locations over her right heel, right lower extremity, and abdominal region. She also has bilateral lower extremity lymphedema which is quite significant as well. Incidentally she also has congestive heart failure  and hypertension. She also is obese. With that being said I think all this is contributing as well to her lower extremity edema which is very much uncontrolled. It seems like its been at least several months since she is worn any compression according to what she tells me. With that being said I am not sure exactly when that would have been. She does have fibrotic changes in the lower extremity secondary to lymphedema worse on the left than the right. She did show me pictures of wound she had on the anterior portion of her shin which was quite significant fortunately that has healed. These issues have been intermittent at this time. Again I think that with appropriate compression therapy she may actually be doing better than what we are seeing at this point but again I am not really sure that she is ever been extremely compliant with that. Fortunately there is no signs of active infection at this time. No fever chills noted. As far as the abdominal ulcer she initially had one wound that she thinks may have been a bug bite. Subsequently she was put a dressing on this and she  states that the tape pulled skin off on other locations causing other wounds that have not healed that is been about 3 months. The wounds on her lower extremities have been intermittent over 3 years. This includes the heel. 11/19; this is a patient that I have not seen previously. She was admitted to our clinic last week with multiple wounds including several superficial circular areas on her abdomen predominantly right upper quadrant. Also 1 in her umbilicus. She has an area on her right lateral malleolus which was new today. She has bilateral lower extremity edema with very significant stasis dermatitis on the left anterior tibial area. She has tightly adherent skin in her lower extremities probably secondary to cutaneous fibrosis in the area. We put her in compression last week. She comes in with the dressings reasonably  saturated. 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 She has multiple areas on her arms from scratching. 12/3; the patient I saw for the first time 2 weeks ago. She has 3 small circular areas on her abdomen 2 in the right upper quadrant one on her umbilicus. We have been using silver alginate to this area. She also had an area on her right lateral malleolus and right heel and right medial malleolus. We have been using silver alginate here under compression. She does not have an open area on the left leg today. We are going to order her stockings for the left leg. She clearly has chronic lymphedema in these areas. X-ray of the foot from 10/20 did not show any fracture or dislocation. The heel wound was noted there was no evidence of osteomyelitis She complains of generalized pruritus. 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 She has longstanding lymphedema. Although she does not have known liver disease I looked at his CT scan of the abdomen from February of this year that showed hepatic steatosis 12/11; patient still has no open area on the left leg we transitioned her into a stocking she had. Her stockings from [employer] are supposed to be arriving later today which will be the stockings of choice. The only wound remaining on the right is the right heel 2 small superficial open areas. Everything else is well on its way to healing in the right leg few small excoriations. She has 1 major area remaining on her abdomen the rest seem to be healing 12/22 patient still has no open area on the left leg and she is still in a stocking. She had still 2 open areas on the tip of her right heel. These look like pressure related areas although the patient is not really certain how  this is happening. She has an open heeled shoe that we provided. Still has 1 open area on the right lateral abdomen [name] [name], [name] [name] ([id number]) [id number] Page 7 of 12 The patient has complaints of generalized pruritus. She has multiple excoriated areas on her abdomen that of close over her arms or thighs which I think are from scratching. She tells me that she has never had a basic work-up for this which might include basic lab work, liver functions, kidney functions, thyroid etc. She might also benefit from a dermatologist 12/29; the patient has a deeper larger more painful wound on the tip of her right heel. Again these look like pressure ulcers although she wears  an open toed heel pads or heel at night to prevent it from hitting the mattress etc. They tell me and remind me that this is been present on and off for about 2 years that has closed over but then will reopen. I did look over her lab work that was available in [place]. She does not have elevated liver function tests or creatinine. I was not able to see a TSH. This was in response to her complaints of generalized itching and a itch/scratch cycle. I also noted that she is on OxyContin and oxycodone and of course narcotics can cause generalized pruritus as a side effect Readmission: History From [place], [place] Last Week: 03/29/2021 this is a patient who presents for initial evaluation here in the clinic though I have seen her 1 time previously in 2020 this was in [place]. That was actually her initial visit therefore a heel ulceration. Subsequently that did not healing she actually saw me the 1 time in November and then subsequently saw Dr. Dellia Nims through the end of December where she apparently was healed. 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 This is on the right. With that being said  right now she has a basically region on the lateral portion of the ray site where she is applying more pressure especially as her foot seems to be starting to turn in and this has become an increasingly significant issue for her to be honest. She does see Dr. Sharol Given and Dr. Sharol Given has had her on doxycycline for quite a bit of time here. Subsequently she is just now wrapping that out. I think that she probably would be benefited by continue the doxycycline for a time while we can work through what we need to do with things here. She does have evidence of osteomyelitis based on what I saw on the MRI today. This obviously is unfortunate and definitely not something that she was hoping to hear. With that being said I do believe that she would potentially be a strong candidate for hyperbaric oxygen therapy. Also believe she could be a candidate for total contact cast though we have to be cautious due to the fact that how her foot is bending inward I think that this is good to be a little bit of a concern. We will definitely have to keeping a close eye on things. She does have a history of diabetes mellitus type 2. She also other than the amputation has a medical history positive for hypertension. She has never undergone hyperbaric oxygen therapy previously I did show her the chamber we did talk a little bit about it today as well. Admission [facility]: o 04/06/2021 Patient presents here in [name] today for evaluation. Again she is establishing care here after I saw her last week in [place]. Obviously I think that she is doing extremely well at this point which is great news and in general I am extremely pleased with where things stand from the standpoint of the appearance of the wound. Obviously this is not significantly smaller but does look a lot cleaner I think using the Hydrofera Blue has been of benefit over the past week. Nonetheless she still has a wound with issues with pressure here  which I think is good to be an ongoing issue. Also think that the osteomyelitis which is chronic is also getting ongoing issue. She  does want to try to do what she can to prevent this from worsening and ending with a below-knee amputation. T that end I do think that getting her into the hyperbaric oxygen chamber would be what we need to do. She has recently been seen by cardiology o and subsequently well as far as that is concerned. Her ejection fraction was appropriate for hyperbarics and everything seems to be doing great in that regard. She has not had a recent chest x-ray she is a former smoker around 15 years ago she quit. Nonetheless I do believe with her asthma we do want to do a chest x-ray just to make sure everything is okay before proceeding with the hyperbarics although we can go ahead and see about getting the approval. I also think she is going require some sharp debridement and around this wound we also have to contact her insurance for prior approval on this as well. 04/13/2021 upon evaluation today patient appears to be doing a little bit better in regard to her leg in fact the swelling is dramatically better. Very pleased with where things stand in that regard. Fortunately there does not appear to be any signs of active infection at this time. No fevers, chills, nausea, vomiting, or diarrhea. 04/20/2021 upon evaluation today patient appears to be doing decently well in regard to her foot. There is some need for sharp debridement here today. With that being said we will get a go ahead and proceed with that today as the foot is becoming somewhat macerated with the overhanging callus that is definitely not we want to see. With that being said the patient does have an issue here as well with a boil in the perineal region unfortunately that is an issue for her today as well. I did have a look at that as well. We are still working on dealing with insurance as far as getting hyperbarics  approved. 04/27/2021 upon evaluation today patient appears to be doing somewhat poorly in general compared to where she has been. At this point she is having a lot of swelling which is the main concerning thing that I am seeing. There does not appear to be any signs of infection currently which is good news but at the same time I do feel like that she is a lot more swollen than she was even last week and is showing how much she is weeping in regard to the right lower leg. She also has a blister on the left breast although is not an open wound at this time. She continues to have the area in the perineum as well. 05/04/2021 upon evaluation today patient appears to actually be doing decently well in regard to her wound on the foot. Fortunately there is no signs of active infection at this time. No fevers, chills, nausea, vomiting, or diarrhea. Unfortunately she does have an area on the left breast which is actually appearing to be a burn based on what I see physically. She does note that she uses rice bags for areas in general and may have left one in that area not realizing it was burning her as she does not really have much feeling. I think this is very probable based on what I am seeing. For that reason working to treat this as such I do think we need to loosen up a lot of the necrotic tissue here. I Am going tosend in a prescription for Santyl for the patient. 9/1; patient presents for HBO today but  cannot do treatment due to wheezing and feeling short of breath when laying down. She was set up as a doctor visit today since she was not able to do HBO. She states she feels okay overall and started having a dry cough over the past couple days. She has not tested herself for COVID. She denies fever/chills or sputum production. She thinks her symptoms are related to allergies. She has been using silver alginate to her right plantar foot wound. She has not been using Santyl to the breast wound because she  reports forgetting to do this. She uses zinc oxide to her perennial wound. She currently denies systemic signs of infection. 9/8; patient presents for follow-up. She has been a unable to do HBO treatments for the past week due to her back pain. She is currently taking Flexeril to address this issue. She reports no issues with her compression wrap. She has been putting Santyl on the left breast wound and Monistat cream to the perennial wound. She denies signs of infection. 9/15; patient presents for follow-up. Again she is unable to do HBO treatment today due to sinus pain. She has 2 new wounds to her right foot which she is unaware of. She has been putting Santyl on the left breast wound and zinc oxide to the perineal wound. She currently denies signs of infection. Readmission 10/17/2021 Ms. Alizandra Loh is a 61 year old female with a past medical history of type 2 diabetes, osteomyelitis status post right BKA and morbid obesity that presents to the clinic for multiple scattered open wounds to her body. These are located to the abdomen, left posterior leg and sacral region. She has been using mupirocin ointment, Neosporin and zinc oxide to the wound beds. She has been started on Bactrim and Keflex by her primary care physician and states she has several days left to complete the course. She currently denies systemic signs of infection. READMISSION This is a 61 year old morbidly obese type II diabetic (poorly controlled with last hemoglobin A1c 9.2%). She has congestive heart failure, peripheral vascular disease, hypertension, coronary artery disease, venous stasis, connective tissue disease (o [name]'s syndrome), and bilateral lower extremity edema. She has been seen here for various reasons in the past. She has a right above-knee amputation. She was recently hospitalized for a left diabetic foot ulcer. Orthopedic surgery was consulted. An MRI was performed that was not conclusive for osteomyelitis,  but given the extent of the necrosis, orthopedics recommended amputation. Infectious disease was also consulted and have recommended a 6-week course of oral doxycycline and Augmentin. Apparently wound care was also consulted while she was in the hospital and simply recommended painting the area with Betadine. She saw infectious disease on October 27 with plans to continue doxycycline on Augmentin to continue therapy until November 14. The infectious disease provider also recommended that the patient reconsider amputation. Her PCP referred her to the wound care center, as the patient is very reluctant to undergo amputation. She also has a small ulcer on her anterior tibial surface that recently opened after a minor trauma. On exam, her left foot is rolled such that the lateral aspect of her foot is the contact surface. There is a large ulcer with exposed necrotic muscle and fat. The [name] [name], [name] [name] ([id number]) [id number] Page 8 of 12 wound does not probe to bone, but apparently there was bone exposure in the past while she was in the hospital. No malodor or purulent drainage. The wound on her anterior tibial surface is small with  just a little slough accumulation. She has modest edema and skin changes consistent with stasis dermatitis. 07/28/2022: The anterior tibial wound is healed. The left plantar foot wound looks a little bit better today. There is significantly less necrotic tissue present. There is an area at the most caudal aspect of the wound that looks like there is ongoing pressure-induced tissue injury. Bone remains covered. Patient History Information obtained from Patient. Family History Diabetes - Mother, Heart Disease - Mother,Father,Siblings, Hypertension - Mother,Father,Siblings, Lung Disease - Father, Stroke - Mother, No family history of Cancer, Hereditary Spherocytosis, Kidney Disease, Seizures, Thyroid Problems, Tuberculosis. Social History Former  smoker - quit 15 years ago, Marital Status - Married, Alcohol Use - Never, Drug Use - No History, Caffeine Use - Rarely. Medical History Eyes Patient has history of Cataracts - both eyes Denies history of Glaucoma, Optic Neuritis Ear/Nose/Mouth/Throat Denies history of Chronic sinus problems/congestion, Middle ear problems Hematologic/Lymphatic Patient has history of Lymphedema Denies history of Anemia, Hemophilia, Human Immunodeficiency Virus, Sickle Cell Disease Respiratory Patient has history of Asthma Denies history of Aspiration, Chronic Obstructive Pulmonary Disease (COPD), Pneumothorax, Sleep Apnea, Tuberculosis Cardiovascular Patient has history of Congestive Heart Failure, Coronary Artery Disease, Hypertension, Peripheral Arterial Disease, Peripheral Venous Disease Denies history of Angina, Arrhythmia, Hypotension, Myocardial Infarction, Phlebitis, Vasculitis Gastrointestinal Denies history of Hepatitis A, Hepatitis B, Hepatitis C Endocrine Patient has history of Type II Diabetes Denies history of Type I Diabetes Genitourinary Denies history of End Stage Renal Disease Immunological Denies history of Lupus Erythematosus, Raynaudoos, Scleroderma Integumentary (Skin) Denies history of History of Burn Musculoskeletal Patient has history of Rheumatoid Arthritis Denies history of Gout, Osteoarthritis, Osteomyelitis Neurologic Patient has history of Neuropathy Denies history of Dementia, Quadriplegia, Paraplegia, Seizure Disorder Oncologic Denies history of Received Chemotherapy, Received Radiation Psychiatric Denies history of Anorexia/bulimia, Confinement Anxiety Hospitalization/Surgery History - 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 Sharol Given 01/2020. 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 - 11/22 R AKA. 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 Vitals Time Taken: 10:55 AM, Height: 62 in, Weight: 284 lbs, BMI: 51.9, Temperature: 98.4 F, Pulse: 92 bpm, Respiratory Rate: 18 breaths/min, Blood Pressure: 139/64 mmHg. Respiratory Normal work of breathing on room air.Marland [place], Supriya [name] ([id number]) [id number] Page 9 of 12 General Notes: 07/28/2022: The anterior tibial wound is healed. The left plantar foot wound looks a little bit better today. There is significantly less necrotic tissue present. There is an area at the most caudal aspect of the wound that looks like there is ongoing pressure-induced tissue injury. Bone remains covered. Integumentary (Hair, Skin) Wound #18 status is Healed - Epithelialized. Original cause of wound was Trauma. The date acquired was: 07/17/2022. The wound has been in treatment 1 weeks. The wound is located on the Left,Anterior Lower Leg. The wound measures 0cm length x 0cm width x 0cm depth; 0cm^2 area and 0cm^3 volume. There is no tunneling or undermining noted. There is a none present amount of drainage noted. There is no granulation within the wound bed. There is no necrotic tissue within the wound bed. The periwound skin appearance had no abnormalities noted for texture. The periwound skin appearance had no abnormalities noted for moisture. The periwound skin appearance did not exhibit: Atrophie Blanche, Cyanosis, Ecchymosis, Hemosiderin Staining, Mottled, Pallor, Rubor, Erythema. Periwound temperature was noted as No Abnormality. Wound #19  status is Open. Original cause of wound was Gradually Appeared. The date acquired was: 05/17/2022. The wound has been in treatment 1 weeks. The wound is located on the [place]. The wound measures 5.2cm length x 4.5cm  width x 0.4cm depth; 18.378cm^2 area and 7.351cm^3 volume. There is Fat Layer (Subcutaneous Tissue) exposed. There is no tunneling or undermining noted. There is a medium amount of serosanguineous drainage noted. The wound margin is distinct with the outline attached to the wound base. There is small (1-33%) pink granulation within the wound bed. There is a large (67-100%) amount of necrotic tissue within the wound bed including Eschar and Adherent Slough. The periwound skin appearance did not exhibit: Callus, Crepitus, Excoriation, Induration, Rash, Scarring, Dry/Scaly, Maceration, Atrophie Blanche, Cyanosis, Ecchymosis, Hemosiderin Staining, Mottled, Pallor, Rubor, Erythema. Periwound temperature was noted as No Abnormality. 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 There was a Excisional Skin/Subcutaneous Tissue/Muscle Debridement with a total area of 23.4 sq cm performed by [name] Maudlin, MD. With the following instrument(s): Curette to remove Non-Viable tissue/material. Material removed includes Muscle, Subcutaneous Tissue, and Slough after achieving pain control using Lidocaine 5% topical ointment. No specimens were taken. A time out was conducted at 11:20, prior to the start of the procedure. A Minimum amount of bleeding was controlled with Pressure. The procedure was tolerated well with a pain level of 0 throughout and a pain level of 0 following  the procedure. Post Debridement Measurements: 5.2cm length x 4.5cm width x 0.4cm depth; 7.351cm^3 volume. Character of Wound/Ulcer Post Debridement is improved. Severity of Tissue Post Debridement is: Fat layer exposed. Post procedure Diagnosis Wound #19: Same as Pre-Procedure General Notes: Scribed for Dr. Celine [name] by [name] [name]. Plan Follow-up Appointments: Return Appointment in 2 weeks. - Dr. Celine [name] Room 3 Anesthetic: (In clinic) Topical Lidocaine 5% applied to wound bed - In Clinic Off-Loading: Other: - Try and elevate Left leg throughout the day WOUND #19: - Foot Wound Laterality: Plantar, Left Cleanser: Soap and Water 1 x Per Day/30 Days Discharge Instructions: May shower and wash wound with dial antibacterial soap and water prior to dressing change. Cleanser: Wound Cleanser (Generic) 1 x Per Day/30 Days Discharge Instructions: Cleanse the wound with wound cleanser prior to applying a clean dressing using gauze sponges, not tissue or cotton balls. Cleanser: [place] Ancillary Kit - 15 Day Supply (Generic) 1 x Per Day/30 Days Discharge Instructions: Use supplies as instructed; Kit contains: (15) Saline Bullets; (15) 3x3 Gauze; 15 pr Gloves Prim Dressing: KerraCel Ag Gelling Fiber Dressing, 2x2 in (silver alginate) (Generic) 1 x Per Day/30 Days ary Discharge Instructions: Apply silver alginate to wound bed as instructed Secondary Dressing: Optifoam Non-Adhesive Dressing, 4x4 in (DME) (Generic) 1 x Per Day/30 Days Discharge Instructions: Apply over primary dressing as directed. Secured With: Elastic Bandage 4 inch (ACE bandage) (Generic) 1 x Per Day/30 Days Discharge Instructions: Secure with ACE bandage as directed. Secured With: [employer], 4.5x3.1 (in/yd) (Generic) 1 x Per Day/30 Days Discharge Instructions: Secure with Kerlix as directed. Secured With: 4M Medipore Public affairs consultant Surgical T 2x10 (in/yd) (  Generic) 1 x Per Day/30 Days ape Discharge Instructions: Secure with tape as  directed. [name] [name], [name] [name] ([id number]) [id number] Page 10 of 12 07/28/2022: The anterior tibial wound is healed. The left plantar foot wound looks a little bit better today. There is significantly less necrotic tissue present. There is an area at the most caudal aspect of the wound that looks like there is ongoing pressure-induced tissue injury. Bone remains covered. I used a curette to debride slough, necrotic muscle, and nonviable subcutaneous tissue from the wound. We will continue to use silver alginate and use foam to build up her foot so that she is not applying pressure directly to the wound. I cautioned her about the area of the foot that looks like it is still sustaining a substantial amount of pressure. Due to the Thanksgiving holiday, she will follow-up in 2 weeks. Electronic Signature(s) Signed: 07/28/2022 12:16:15 PM By: [name] Maudlin MD FACS Entered By: [name] [name] on 07/28/2022 12:16:15 -------------------------------------------------------------------------------- HxROS Details Patient Name: Date of Service: Urbanik, [name] [name]. 07/28/2022 10:15 A [name] Medical Record Number: [id number] Patient Account Number: [id number] Date of Birth/Sex: Treating RN: 01/09/1961 (61 y.o. F) Primary Care Provider: Riki [name] Other Clinician: Referring Provider: Treating Provider/Extender: [name] [name] in Treatment: 1 Information Obtained From Patient Constitutional Symptoms (General Health) Medical History: Past Medical History Notes: hypothyroidism Eyes Medical History: Positive for: Cataracts - both eyes Negative for: Glaucoma; Optic Neuritis Ear/Nose/Mouth/Throat Medical History: Negative for: Chronic sinus problems/congestion; Middle ear problems Hematologic/Lymphatic Medical History: Positive for: Lymphedema Negative for: Anemia; Hemophilia; Human Immunodeficiency Virus; Sickle Cell  Disease Respiratory Medical History: Positive for: Asthma Negative for: Aspiration; Chronic Obstructive Pulmonary Disease (COPD); Pneumothorax; Sleep Apnea; Tuberculosis Cardiovascular Medical History: Positive for: Congestive Heart Failure; Coronary Artery Disease; Hypertension; Peripheral Arterial Disease; Peripheral Venous Disease Negative for: Angina; Arrhythmia; Hypotension; Myocardial Infarction; Phlebitis; Vasculitis Past Medical History Notes: cardiac stents Gastrointestinal Medical History: Negative for: Hepatitis A; Hepatitis B; Hepatitis C Past Medical History Notes: GERD Endocrine [name] [name], [name] [name] ([id number]) [id number] Page 11 of 12 Medical History: Positive for: Type II Diabetes Negative for: Type I Diabetes Past Medical History Notes: Hypothyroidism Time with diabetes: since 2002 Treated with: Insulin, Oral agents Blood sugar tested every day: Yes Tested : 2-3 times a day Genitourinary Medical History: Negative for: End Stage Renal Disease Immunological Medical History: Negative for: Lupus Erythematosus; Raynauds; Scleroderma 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 Integumentary (Skin) Medical History: Negative for: History of Burn Musculoskeletal Medical History: Positive for: Rheumatoid Arthritis Negative for: Gout; Osteoarthritis; Osteomyelitis Past Medical History Notes: Fibromyalgia, Scoliosis, Neurologic Medical History: Positive for: Neuropathy Negative for: Dementia; Quadriplegia; Paraplegia; Seizure Disorder Oncologic Medical History: Negative for: Received Chemotherapy; Received Radiation Psychiatric Medical History: Negative for: Anorexia/bulimia; Confinement Anxiety HBO Extended History Items Eyes: Cataracts Immunizations Pneumococcal Vaccine: Received Pneumococcal Vaccination: Yes Received Pneumococcal Vaccination On or After 60th Birthday:  Yes Implantable Devices None Hospitalization / Surgery History Type of Hospitalization/Surgery 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 Family and Social History Cancer: No; Diabetes: Yes - Mother; Heart Disease: Yes - Mother,Father,Siblings; Hereditary Spherocytosis: No; Hypertension: Yes - Mother,Father,Siblings; Kidney Disease: No; Lung Disease: Yes - Father; Seizures: No; Stroke: Yes - Mother; Thyroid Problems: No; Tuberculosis: No; Former smoker - quit 15 years ago; Marital Status - Married; Alcohol Use: Never; Drug Use: No History; Caffeine Use: Rarely; Financial Concerns: No; Food, Clothing or Shelter Needs: No;  Support System Lacking: No; Transportation Concerns: No Weekley, Ivyonna [name] ([id number]) [id number] Page 12 of 12 Electronic Signature(s) Signed: 07/28/2022 12:43:40 PM By: [name] Maudlin MD FACS Entered By: [name] [name] on 07/28/2022 12:07:12 -------------------------------------------------------------------------------- SuperBill Details Patient Name: Date of Service: Polinski, [name] [name]. 07/28/2022 Medical Record Number: [id number] Patient Account Number: [id number] Date of Birth/Sex: Treating RN: Feb 26, 1961 (61 y.o. 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 Facility Procedures : 3 CPT4 Code:  1916606 Description: [facility] - DEB MUSC/FASCIA 20 SQ CM/< ICD-10 Diagnosis Description 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 Modifier: Quantity: 1 : 3 CPT4 Code: [id number] Description: 11046 - DEB MUSC/FASCIA EA ADDL 20 CM ICD-10 Diagnosis Description 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 Modifier: Quantity: 1 Physician Procedures : CPT4 Code Description Modifier [id number] - WC PHYS LEVEL 3 - EST PT 25 ICD-10 Diagnosis Description 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 M86.9 Osteomyelitis, unspecified E11.621 Type 2 diabetes  mellitus with foot ulcer I73.9 Peripheral vascular disease, unspecified Quantity: 1 : [id number] 61683 - WC PHYS DEBR MUSCLE/FASCIA 20 SQ CM ICD-10 Diagnosis Description 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

## 2022-07-31 ENCOUNTER — Ambulatory Visit: Payer: Commercial Managed Care - HMO | Admitting: Orthopedic Surgery

## 2022-07-31 ENCOUNTER — Other Ambulatory Visit (HOSPITAL_COMMUNITY): Payer: Self-pay

## 2022-07-31 ENCOUNTER — Encounter: Payer: Commercial Managed Care - HMO | Admitting: Registered Nurse

## 2022-07-31 ENCOUNTER — Other Ambulatory Visit: Payer: Self-pay | Admitting: Family Medicine

## 2022-07-31 DIAGNOSIS — E559 Vitamin D deficiency, unspecified: Secondary | ICD-10-CM

## 2022-07-31 NOTE — Telephone Encounter (Signed)
Patient wanted to let me know that she has DexCom 7 sensors. She was going to try to come in Friday 11/17 when she called to start her first Sensors but I was out of the office. Patient is not sure when this week or next week will be good for her. She will call back.

## 2022-08-01 ENCOUNTER — Ambulatory Visit: Payer: Commercial Managed Care - HMO | Admitting: Infectious Diseases

## 2022-08-01 ENCOUNTER — Encounter: Payer: Self-pay | Admitting: Registered Nurse

## 2022-08-01 ENCOUNTER — Encounter: Payer: Commercial Managed Care - HMO | Attending: Registered Nurse | Admitting: Registered Nurse

## 2022-08-01 VITALS — HR 80 | Ht 62.0 in | Wt 284.0 lb

## 2022-08-01 DIAGNOSIS — M25511 Pain in right shoulder: Secondary | ICD-10-CM | POA: Insufficient documentation

## 2022-08-01 DIAGNOSIS — M545 Low back pain, unspecified: Secondary | ICD-10-CM | POA: Insufficient documentation

## 2022-08-01 DIAGNOSIS — M7061 Trochanteric bursitis, right hip: Secondary | ICD-10-CM | POA: Diagnosis not present

## 2022-08-01 DIAGNOSIS — M25512 Pain in left shoulder: Secondary | ICD-10-CM | POA: Diagnosis not present

## 2022-08-01 DIAGNOSIS — G8929 Other chronic pain: Secondary | ICD-10-CM | POA: Insufficient documentation

## 2022-08-01 DIAGNOSIS — Z79899 Other long term (current) drug therapy: Secondary | ICD-10-CM | POA: Insufficient documentation

## 2022-08-01 DIAGNOSIS — M7062 Trochanteric bursitis, left hip: Secondary | ICD-10-CM | POA: Insufficient documentation

## 2022-08-01 DIAGNOSIS — Z5181 Encounter for therapeutic drug level monitoring: Secondary | ICD-10-CM | POA: Insufficient documentation

## 2022-08-01 DIAGNOSIS — G894 Chronic pain syndrome: Secondary | ICD-10-CM | POA: Insufficient documentation

## 2022-08-01 DIAGNOSIS — M546 Pain in thoracic spine: Secondary | ICD-10-CM | POA: Insufficient documentation

## 2022-08-01 DIAGNOSIS — M2550 Pain in unspecified joint: Secondary | ICD-10-CM | POA: Insufficient documentation

## 2022-08-01 MED ORDER — OXYCODONE HCL 20 MG PO TABS: 1.0000 | ORAL_TABLET | Freq: Three times a day (TID) | ORAL | 0 refills | Status: DC | PRN

## 2022-08-01 NOTE — Progress Notes (Unsigned)
Subjective:    Patient ID: [name], female    DOB: January 06, 1961, 61 y.o.   MRN: [id number]  HPI: [name] is a 61 y.o. female who returns for follow up appointment for chronic pain and medication refill. states *** pain is located in  ***. rates pain ***. current exercise regime is walking and performing stretching exercises.      Morphine equivalent is *** MME.    Pain Inventory Average Pain 7 Pain Right Now 8 My pain is constant, sharp, burning, dull, stabbing, tingling, and aching  In the last 24 hours, has pain interfered with the following? General activity 10 Relation with others 10 Enjoyment of life 10 What TIME of day is your pain at its worst? varies Sleep (in general) Poor  Pain is worse with: walking, bending, sitting, standing, and some activites Pain improves with: rest, heat/ice, and medication Relief from Meds: 5  Family History  Problem Relation Age of Onset   CAD Mother    Hypertension Mother    Heart attack Mother    Stroke Mother    CAD Father    Heart attack Father    Allergic rhinitis Father    Asthma Father    Hypertension Brother    Hypertension Brother    Pancreatic cancer Paternal Aunt    Breast cancer Paternal Aunt    Lung cancer Paternal Aunt    Allergic rhinitis Son    Asthma Son    Social History   Socioeconomic History   Marital status: Married    Spouse name: [name] Defelice   Number of children: 1   Years of education: 11   Highest education level: GED or equivalent  Occupational History    Comment: Not currrently working ( last worked 2004)  Tobacco Use   Smoking status: Former    Packs/day: 1.00    Years: 19.00    Total pack years: 19.00    Types: Cigarettes    Start date: 29    Quit date: 02/11/2004    Years since quitting: 18.4    Passive exposure: Past   Smokeless tobacco: Never  Vaping Use   Vaping Use: Never used  Substance and Sexual Activity   Alcohol use: Yes    Comment: RARE Wine   Drug use:  Not Currently    Types: Marijuana    Comment: "only in my teens"   Sexual activity: Yes    Birth control/protection: Surgical    Comment: Hysterectomy  Other Topics Concern   Not on file  Social History Narrative   Not on file   Social Determinants of Health   Financial Resource Strain: Low Risk  (12/27/2021)   Overall Financial Resource Strain (CARDIA)    Difficulty of Paying Living Expenses: Not hard at all  Food Insecurity: No Food Insecurity (06/14/2022)   Hunger Vital Sign    Worried About Running Out of Food in the Last Year: Never true    Ran Out of Food in the Last Year: Never true  Transportation Needs: No Transportation Needs (06/14/2022)   PRAPARE - Administrator, Civil Service (Medical): No    Lack of Transportation (Non-Medical): No  Physical Activity: Inactive (08/25/2021)   Exercise Vital Sign    Days of Exercise per Week: 0 days    Minutes of Exercise per Session: 0 min  Stress: Stress Concern Present (08/25/2021)   [employer] of Occupational Health - Occupational Stress Questionnaire    Feeling of Stress :  Very much  Social Connections: Moderately Integrated (08/25/2021)   Social Connection and Isolation Panel [NHANES]    Frequency of Communication with Friends and Family: More than three times a week    Frequency of Social Gatherings with Friends and Family: More than three times a week    Attends Religious Services: More than 4 times per year    Active Member of [employer] or Organizations: No    Attends Banker Meetings: Never    Marital Status: Married   Past Surgical History:  Procedure Laterality Date   ABDOMINAL HYSTERECTOMY  06/2005   "w/right ovariy"   AMPUTATION Right 01/28/2020    RIGHT FOURTH AND FIFTH RAY AMPUTATION   AMPUTATION Right 01/28/2020   Procedure: RIGHT FOURTH AND FIFTH RAY AMPUTATION,;  Surgeon: [name], MD;  Location: [facility] OR;  Service: Orthopedics;  Laterality: Right;   AMPUTATION Right 06/01/2021    Procedure: RIGHT BELOW KNEE AMPUTATION;  Surgeon: [name], MD;  Location: [facility] OR;  Service: Orthopedics;  Laterality: Right;   AMPUTATION Right 07/22/2021   Procedure: RIGHT ABOVE KNEE AMPUTATION;  Surgeon: [name], MD;  Location: [facility] OR;  Service: Orthopedics;  Laterality: Right;   APPENDECTOMY     BREAST BIOPSY Bilateral    9 total (04/18/2018)   CORONARY ANGIOPLASTY WITH STENT PLACEMENT  10/01/2015   normal LM, 30% LAD, 85% mid RCA and 95% distal RCA s/p PCI of the mid to distal RCA and now on DAPT with ASA and Ticagrelor.     CORONARY STENT INTERVENTION Right 04/18/2018   Procedure: CORONARY STENT INTERVENTION;  Surgeon: [name], MD;  Location: [facility];  Service: Cardiovascular;  Laterality: Right;   DILATION AND CURETTAGE OF UTERUS     FRACTURE SURGERY     LAPAROSCOPIC CHOLECYSTECTOMY     LEFT HEART CATH AND CORONARY ANGIOGRAPHY N/A 04/18/2018   Procedure: LEFT HEART CATH AND CORONARY ANGIOGRAPHY;  Surgeon: [name], MD;  Location: [facility];  Service: Cardiovascular;  Laterality: N/A;   MUSCLE BIOPSY Left    "leg"   OOPHORECTOMY  07/2010   RADIOLOGY WITH ANESTHESIA N/A 05/25/2016   Procedure: RADIOLOGY WITH ANESTHESIA;  Surgeon: Medication Radiologist, MD;  Location: [facility] OR;  Service: Radiology;  Laterality: N/A;   STUMP REVISION Right 06/29/2021   Procedure: REVISION RIGHT BELOW KNEE AMPUTATION;  Surgeon: [name], MD;  Location[facility] OR;  Service: Orthopedics;  Laterality: Right;   TEE WITHOUT CARDIOVERSION N/A 06/01/2021   Procedure: TRANSESOPHAGEAL ECHOCARDIOGRAM (TEE);  Surgeon: Sande Rives, MD;  Location: [facility] OR;  Service: Cardiovascular;  Laterality: N/A;   TONSILLECTOMY AND ADENOIDECTOMY     WRIST FRACTURE SURGERY Left    "crushed it"   Past Surgical History:  Procedure Laterality Date   ABDOMINAL HYSTERECTOMY  06/2005   "w/right ovariy"   AMPUTATION Right 01/28/2020    RIGHT FOURTH AND FIFTH RAY  AMPUTATION   AMPUTATION Right 01/28/2020   Procedure: RIGHT FOURTH AND FIFTH RAY AMPUTATION,;  Surgeon: [name], MD;  Location: [facility] OR;  Service: Orthopedics;  Laterality: Right;   AMPUTATION Right 06/01/2021   Procedure: RIGHT BELOW KNEE AMPUTATION;  Surgeon: [name], MD;  Location: [facility] OR;  Service: Orthopedics;  Laterality: Right;   AMPUTATION Right 07/22/2021   Procedure: RIGHT ABOVE KNEE AMPUTATION;  Surgeon: [name], MD;  Location: [facility] OR;  Service: Orthopedics;  Laterality: Right;   APPENDECTOMY     BREAST BIOPSY Bilateral  9 total (04/18/2018)   CORONARY ANGIOPLASTY WITH STENT PLACEMENT  10/01/2015   normal LM, 30% LAD, 85% mid RCA and 95% distal RCA s/p PCI of the mid to distal RCA and now on DAPT with ASA and Ticagrelor.     CORONARY STENT INTERVENTION Right 04/18/2018   Procedure: CORONARY STENT INTERVENTION;  Surgeon: [name], MD;  Location: [facility];  Service: Cardiovascular;  Laterality: Right;   DILATION AND CURETTAGE OF UTERUS     FRACTURE SURGERY     LAPAROSCOPIC CHOLECYSTECTOMY     LEFT HEART CATH AND CORONARY ANGIOGRAPHY N/A 04/18/2018   Procedure: LEFT HEART CATH AND CORONARY ANGIOGRAPHY;  Surgeon: [name], MD;  Location: [facility];  Service: Cardiovascular;  Laterality: N/A;   MUSCLE BIOPSY Left    "leg"   OOPHORECTOMY  07/2010   RADIOLOGY WITH ANESTHESIA N/A 05/25/2016   Procedure: RADIOLOGY WITH ANESTHESIA;  Surgeon: Medication Radiologist, MD;  Location: [facility] OR;  Service: Radiology;  Laterality: N/A;   STUMP REVISION Right 06/29/2021   Procedure: REVISION RIGHT BELOW KNEE AMPUTATION;  Surgeon: [name], MD;  Location: [facility] OR;  Service: Orthopedics;  Laterality: Right;   TEE WITHOUT CARDIOVERSION N/A 06/01/2021   Procedure: TRANSESOPHAGEAL ECHOCARDIOGRAM (TEE);  Surgeon: Sande Rives, MD;  Location: [facility] OR;  Service: Cardiovascular;  Laterality: N/A;   TONSILLECTOMY AND ADENOIDECTOMY     WRIST  FRACTURE SURGERY Left    "crushed it"   Past Medical History:  Diagnosis Date   Aortic stenosis    mild AS by echo 02/2021   Asthma    "on daily RX and rescue inhaler" (04/18/2018)   Chronic diastolic CHF (congestive heart failure) (HCC) 09/2015   Chronic lower back pain    Chronic neck pain    Chronic pain syndrome    Fentanyl Patch   Colon polyps    Coronary artery disease    cath with normal LM, 30% LAD, 85% mid RCA and 95% distal RCA s/p PCI of the mid to distal RCA and now on DAPT with ASA and Ticagrelor.     Eczema    Excessive daytime sleepiness 11/26/2015   Fibromyalgia    Gallstones    GERD (gastroesophageal reflux disease)    Heart murmur    "noted for the 1st time on 04/18/2018"   History of blood transfusion 07/2010   "S/P oophorectomy"   History of gout    History of hiatal hernia 1980s   "gone now" (04/18/2018)   Hyperlipidemia    Hypertension    takes Metoprolol and Enalapril daily   Hypothyroidism    takes Synthroid daily   IBS (irritable bowel syndrome)    Migraine    "nothing in the 2000s" (04/18/2018)   Mixed connective tissue disease (HCC)    NAFLD (nonalcoholic fatty liver disease)    Pneumonia    "several times" (04/18/2018)   PVC's (premature ventricular contractions)    noted on event monitor 02/2021   Rheumatoid arthritis (HCC)    "hands, elbows, shoulders, probably knees" (04/18/2018)   Scoliosis    Sjogren's syndrome (HCC)    Sleep apnea    mild - does not use cpap   Spondylosis    Type II diabetes mellitus (HCC)    takes Metformin and Hum R daily (04/18/2018)   Walker as ambulation aid    also uses wheelchair   Ht  (1.575 m)   BMI 56.52 kg/m   Opioid Risk Score:  Fall Risk Score:  `1  Depression screen [facility] 2/9     07/07/2022    9:16 AM 06/27/2022   11:39 AM 05/02/2022   11:56 AM 03/30/2022   11:08 AM 02/27/2022    1:37 PM 01/31/2022   10:18 AM 12/20/2021   10:56 AM  Depression screen PHQ 2/9  Decreased Interest 0 1 1 1 1  0 3   Down, Depressed, Hopeless 1 1 1 1 1  0 3  PHQ - 2 Score 1 2 2 2 2  0 6      Review of Systems  Musculoskeletal:  Positive for back pain, gait problem and joint swelling.  All other systems reviewed and are negative.     Objective:   Physical Exam        Assessment & Plan:

## 2022-08-02 ENCOUNTER — Other Ambulatory Visit: Payer: Self-pay | Admitting: Family Medicine

## 2022-08-03 ENCOUNTER — Encounter: Payer: Self-pay | Admitting: Registered Nurse

## 2022-08-11 ENCOUNTER — Encounter (HOSPITAL_BASED_OUTPATIENT_CLINIC_OR_DEPARTMENT_OTHER): Payer: Commercial Managed Care - HMO | Attending: General Surgery | Admitting: General Surgery

## 2022-08-11 DIAGNOSIS — M3500 Sicca syndrome, unspecified: Secondary | ICD-10-CM | POA: Insufficient documentation

## 2022-08-11 DIAGNOSIS — I110 Hypertensive heart disease with heart failure: Secondary | ICD-10-CM | POA: Insufficient documentation

## 2022-08-11 DIAGNOSIS — L97423 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: Secondary | ICD-10-CM | POA: Insufficient documentation

## 2022-08-11 DIAGNOSIS — Z89611 Acquired absence of right leg above knee: Secondary | ICD-10-CM | POA: Diagnosis not present

## 2022-08-11 DIAGNOSIS — M869 Osteomyelitis, unspecified: Secondary | ICD-10-CM | POA: Diagnosis not present

## 2022-08-11 DIAGNOSIS — I5032 Chronic diastolic (congestive) heart failure: Secondary | ICD-10-CM | POA: Insufficient documentation

## 2022-08-11 DIAGNOSIS — M069 Rheumatoid arthritis, unspecified: Secondary | ICD-10-CM | POA: Insufficient documentation

## 2022-08-11 DIAGNOSIS — E1151 Type 2 diabetes mellitus with diabetic peripheral angiopathy without gangrene: Secondary | ICD-10-CM | POA: Diagnosis not present

## 2022-08-11 DIAGNOSIS — E11621 Type 2 diabetes mellitus with foot ulcer: Secondary | ICD-10-CM | POA: Diagnosis not present

## 2022-08-11 DIAGNOSIS — J45909 Unspecified asthma, uncomplicated: Secondary | ICD-10-CM | POA: Insufficient documentation

## 2022-08-11 DIAGNOSIS — I890 Lymphedema, not elsewhere classified: Secondary | ICD-10-CM | POA: Insufficient documentation

## 2022-08-11 DIAGNOSIS — Z87891 Personal history of nicotine dependence: Secondary | ICD-10-CM | POA: Insufficient documentation

## 2022-08-11 DIAGNOSIS — I2510 Atherosclerotic heart disease of native coronary artery without angina pectoris: Secondary | ICD-10-CM | POA: Insufficient documentation

## 2022-08-11 DIAGNOSIS — E1140 Type 2 diabetes mellitus with diabetic neuropathy, unspecified: Secondary | ICD-10-CM | POA: Diagnosis not present

## 2022-08-11 DIAGNOSIS — E039 Hypothyroidism, unspecified: Secondary | ICD-10-CM | POA: Diagnosis not present

## 2022-08-11 DIAGNOSIS — K219 Gastro-esophageal reflux disease without esophagitis: Secondary | ICD-10-CM | POA: Diagnosis not present

## 2022-08-11 NOTE — Progress Notes (Signed)
[name], [name] M ([id number]) [id number] Page 1 of 13 Visit Report for 08/11/2022 Chief Complaint Document Details Patient Name: Date of Service: [name], [name] [name] 08/11/2022 11:00 A M Medical Record Number: [id number] Patient Account Number: [id number] Date of Birth/Sex: Treating RN: Jan 16, 1961 (61 y.o. F) Primary Care Provider: [name] Other Clinician: Referring Provider: Treating Provider/Extender: Louann Sjogren in Treatment: 3 Information Obtained from: Patient Chief Complaint 10/17/2021; multiple scattered wounds to the abdomen, posterior left leg, left labia and sacrum 07/19/2022: left foot DFU Electronic Signature(s) Signed: 08/11/2022 11:52:31 AM By: Fredirick Maudlin MD FACS Entered By: Fredirick Maudlin on 08/11/2022 11:52:31 -------------------------------------------------------------------------------- Debridement Details Patient Name: Date of Service: [name], [name] 08/11/2022 11:00 A M Medical Record Number: [id number] Patient Account Number: [id number] Date of Birth/Sex: Treating RN: [birth year] (61 y.o. Harlow Ohms Primary Care Provider: [name] Other Clinician: Referring Provider: Treating Provider/Extender: Louann Sjogren in Treatment: 3 Debridement Performed for Assessment: Wound #19 [place] Performed By: Physician Fredirick Maudlin, MD Debridement Type: Debridement Severity of Tissue Pre Debridement: Fat layer exposed Level of Consciousness (Pre-procedure): Awake and Alert Pre-procedure Verification/Time Out Yes - 11:33 Taken: Start Time: 11:33 Pain Control: Lidocaine 5% topical ointment T Area Debrided (L x W): otal 7 (cm) x 3 (cm) = 21 (cm) Tissue and other material debrided: Non-Viable, Muscle, Slough, Subcutaneous, Tendon, Slough Level: Skin/Subcutaneous Tissue/Muscle Debridement Description: Excisional Instrument: Curette Bleeding:  Minimum Hemostasis Achieved: Pressure Response to Treatment: Procedure was tolerated well Level of Consciousness (Post- Awake and Alert procedure): Post Debridement Measurements of Total Wound Length: (cm) 9.6 Width: (cm) 4.2 Depth: (cm) 0.2 Volume: (cm) 6.333 Character of Wound/Ulcer Post Debridement: Improved Severity of Tissue Post Debridement: Fat layer exposed Fortson, Ethelene M ([id number]) [id number] Page 2 of 13 Post Procedure Diagnosis Same as Pre-procedure Notes scribed for Dr. Celine Ahr by [name], RN Electronic Signature(s) Signed: 08/11/2022 11:58:57 AM By: Fredirick Maudlin MD FACS Signed: 08/11/2022 3:37:43 PM By: [name] Entered By: [name] on 08/11/2022 11:41:58 -------------------------------------------------------------------------------- HPI Details Patient Name: Date of Service: [name], [name] 08/11/2022 11:00 A M Medical Record Number: [id number] Patient Account Number: [id number] Date of Birth/Sex: Treating RN: 1961-07-08 (61 y.o. F) Primary Care Provider: [name] Other Clinician: Referring Provider: Treating Provider/Extender: Louann Sjogren in Treatment: 3 History of Present Illness HPI Description: 07/23/2019 on evaluation today patient presents with a myriad of wounds noted at multiple locations over her right heel, right lower extremity, and abdominal region. She also has bilateral lower extremity lymphedema which is quite significant as well. Incidentally she also has congestive heart failure and hypertension. She also is obese. With that being said I think all this is contributing as well to her lower extremity edema which is very much uncontrolled. It seems like its been at least several months since she is worn any compression according to what she tells me. With that being said I am not sure exactly when that would have been. She does have fibrotic changes  in the lower extremity secondary to lymphedema worse on the left than the right. She did show me pictures of wound she had on the anterior portion of her shin which was quite significant fortunately that has healed. These issues have been intermittent at this time. Again I think that with appropriate compression therapy she may actually be doing better than what we are seeing at this point but again I am not really sure that she is ever been extremely compliant  with that. Fortunately there is no signs of active infection at this time. No fever chills noted. As far as the abdominal ulcer she initially had one wound that she thinks may have been a bug bite. Subsequently she was put a dressing on this and she states that the tape pulled skin off on other locations causing other wounds that have not healed that is been about 3 months. The wounds on her lower extremities have been intermittent over 3 years. This includes the heel. 11/19; this is a patient that I have not seen previously. She was admitted to our clinic last week with multiple wounds including several superficial circular areas on her abdomen predominantly right upper quadrant. Also 1 in her umbilicus. She has an area on her right lateral malleolus which was new today. She has bilateral lower extremity edema with very significant stasis dermatitis on the left anterior tibial area. She has tightly adherent skin in her lower extremities probably secondary to cutaneous fibrosis in the area. We put her in compression last week. She comes in with the dressings reasonably saturated. 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 She has multiple areas on her arms from scratching. 12/3; the patient I saw for the first time 2 weeks ago. She has 3 small circular  areas on her abdomen 2 in the right upper quadrant one on her umbilicus. We have been using silver alginate to this area. She also had an area on her right lateral malleolus and right heel and right medial malleolus. We have been using silver alginate here under compression. She does not have an open area on the left leg today. We are going to order her stockings for the left leg. She clearly has chronic lymphedema in these areas. X-ray of the foot from 10/20 did not show any fracture or dislocation. The heel wound was noted there was no evidence of osteomyelitis She complains of generalized pruritus. 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 She has longstanding lymphedema. Although she does not have known liver disease I looked at his CT scan of the abdomen from February of this year that showed hepatic steatosis 12/11; patient still has no open area on the left leg we transitioned her into a stocking she had. Her stockings from [employer] are supposed to be arriving later today which will be the stockings of choice. The only wound remaining on the right is the right heel 2 small superficial open areas. Everything else is well on its way to healing in the right leg few small excoriations. She has 1 major area remaining on her abdomen the rest seem to be healing 12/22 patient still has no open area on the left leg and she is still in a stocking. She had still 2 open areas on the tip of her right heel. These look like pressure related areas although the patient is not really certain how this is happening. She has an open heeled shoe that we provided. Still has 1 open area on the right lateral abdomen The patient has complaints of generalized pruritus. She has multiple excoriated areas on her abdomen that of close over her arms or thighs which I think are from scratching. She tells me that she has never had a basic work-up for this which might include basic lab work,  liver  functions, kidney functions, thyroid etc. She might also benefit from a dermatologist 12/29; the patient has a deeper larger more painful wound on the tip of her right heel. Again these look like pressure ulcers although she wears an open toed heel pads or heel at night to prevent it from hitting the mattress etc. They tell me and remind me that this is been present on and off for about 2 years that has closed over but then will reopen. I did look over her lab work that was available in [place]. She does not have elevated liver function tests or creatinine. I was not able to see a TSH. This was in response to her complaints of generalized itching and a itch/scratch cycle. I also noted that she is on OxyContin and oxycodone and of course narcotics can cause generalized pruritus as a side effect Readmission: History From [place], [place] Last Week: 03/29/2021 this is a patient who presents for initial evaluation here in the clinic though I have seen her 1 time previously in 2020 this was in [place]. That was actually her initial visit therefore a heel ulceration. Subsequently that did not healing she actually saw me the 1 time in November and then subsequently saw Dr. Dellia Nims through the end of December where she apparently was healed. 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 This is on the right. With that being said right now she has a basically region on the lateral portion of the ray site where she is applying more pressure especially as her foot seems to be starting to turn in and this has become an increasingly Werk, Jordin M ([id number]) [id number] Page 3 of 13 significant issue for her to be honest. She does see Dr. Sharol Given and Dr. Sharol Given has had her on doxycycline for quite a bit of time here. Subsequently she is just now wrapping that out. I  think that she probably would be benefited by continue the doxycycline for a time while we can work through what we need to do with things here. She does have evidence of osteomyelitis based on what I saw on the MRI today. This obviously is unfortunate and definitely not something that she was hoping to hear. With that being said I do believe that she would potentially be a strong candidate for hyperbaric oxygen therapy. Also believe she could be a candidate for total contact cast though we have to be cautious due to the fact that how her foot is bending inward I think that this is good to be a little bit of a concern. We will definitely have to keeping a close eye on things. She does have a history of diabetes mellitus type 2. She also other than the amputation has a medical history positive for hypertension. She has never undergone hyperbaric oxygen therapy previously I did show her the chamber we did talk a little bit about it today as well. Admission [facility]: o 04/06/2021 Patient presents here in [place] today for evaluation. Again she is establishing care here after I saw her last week in [place]. Obviously I think that she is doing extremely well at this point which is great news and in general I am extremely pleased with where things stand from the standpoint of the appearance of the wound. Obviously this is not significantly smaller but does look a lot cleaner I think using  the [facility] has been of benefit over the past week. Nonetheless she still has a wound with issues with pressure here which I think is good to be an ongoing issue. Also think that the osteomyelitis which is chronic is also getting ongoing issue. She does want to try to do what she can to prevent this from worsening and ending with a below-knee amputation. T that end I do think that getting her into the hyperbaric oxygen chamber would be what we need to do. She has recently been seen by  cardiology o and subsequently well as far as that is concerned. Her ejection fraction was appropriate for hyperbarics and everything seems to be doing great in that regard. She has not had a recent chest x-ray she is a former smoker around 15 years ago she quit. Nonetheless I do believe with her asthma we do want to do a chest x-ray just to make sure everything is okay before proceeding with the hyperbarics although we can go ahead and see about getting the approval. I also think she is going require some sharp debridement and around this wound we also have to contact her insurance for prior approval on this as well. 04/13/2021 upon evaluation today patient appears to be doing a little bit better in regard to her leg in fact the swelling is dramatically better. Very pleased with where things stand in that regard. Fortunately there does not appear to be any signs of active infection at this time. No fevers, chills, nausea, vomiting, or diarrhea. 04/20/2021 upon evaluation today patient appears to be doing decently well in regard to her foot. There is some need for sharp debridement here today. With that being said we will get a go ahead and proceed with that today as the foot is becoming somewhat macerated with the overhanging callus that is definitely not we want to see. With that being said the patient does have an issue here as well with a boil in the perineal region unfortunately that is an issue for her today as well. I did have a look at that as well. We are still working on dealing with insurance as far as getting hyperbarics approved. 04/27/2021 upon evaluation today patient appears to be doing somewhat poorly in general compared to where she has been. At this point she is having a lot of swelling which is the main concerning thing that I am seeing. There does not appear to be any signs of infection currently which is good news but at the same time I do feel like that she is a lot more swollen than  she was even last week and is showing how much she is weeping in regard to the right lower leg. She also has a blister on the left breast although is not an open wound at this time. She continues to have the area in the perineum as well. 05/04/2021 upon evaluation today patient appears to actually be doing decently well in regard to her wound on the foot. Fortunately there is no signs of active infection at this time. No fevers, chills, nausea, vomiting, or diarrhea. Unfortunately she does have an area on the left breast which is actually appearing to be a burn based on what I see physically. She does note that she uses rice bags for areas in general and may have left one in that area not realizing it was burning her as she does not really have much feeling. I think this is very probable based on  what I am seeing. For that reason working to treat this as such I do think we need to loosen up a lot of the necrotic tissue here. I Am going tosend in a prescription for Santyl for the patient. 9/1; patient presents for HBO today but cannot do treatment due to wheezing and feeling short of breath when laying down. She was set up as a doctor visit today since she was not able to do HBO. She states she feels okay overall and started having a dry cough over the past couple days. She has not tested herself for COVID. She denies fever/chills or sputum production. She thinks her symptoms are related to allergies. She has been using silver alginate to her right plantar foot wound. She has not been using Santyl to the breast wound because she reports forgetting to do this. She uses zinc oxide to her perennial wound. She currently denies systemic signs of infection. 9/8; patient presents for follow-up. She has been a unable to do HBO treatments for the past week due to her back pain. She is currently taking Flexeril to address this issue. She reports no issues with her compression wrap. She has been putting Santyl on  the left breast wound and Monistat cream to the perennial wound. She denies signs of infection. 9/15; patient presents for follow-up. Again she is unable to do HBO treatment today due to sinus pain. She has 2 new wounds to her right foot which she is unaware of. She has been putting Santyl on the left breast wound and zinc oxide to the perineal wound. She currently denies signs of infection. Readmission 10/17/2021 Ms. Ysabel Stankovich is a 61 year old female with a past medical history of type 2 diabetes, osteomyelitis status post right BKA and morbid obesity that presents to the clinic for multiple scattered open wounds to her body. These are located to the abdomen, left posterior leg and sacral region. She has been using mupirocin ointment, Neosporin and zinc oxide to the wound beds. She has been started on Bactrim and Keflex by her primary care physician and states she has several days left to complete the course. She currently denies systemic signs of infection. READMISSION This is a 61 year old morbidly obese type II diabetic (poorly controlled with last hemoglobin A1c 9.2%). She has congestive heart failure, peripheral vascular disease, hypertension, coronary artery disease, venous stasis, connective tissue disease (o Sjogren's syndrome), and bilateral lower extremity edema. She has been seen here for various reasons in the past. She has a right above-knee amputation. She was recently hospitalized for a left diabetic foot ulcer. Orthopedic surgery was consulted. An MRI was performed that was not conclusive for osteomyelitis, but given the extent of the necrosis, orthopedics recommended amputation. Infectious disease was also consulted and have recommended a 6-week course of oral doxycycline and Augmentin. Apparently wound care was also consulted while she was in the hospital and simply recommended painting the area with Betadine. She saw infectious disease on October 27 with plans to continue  doxycycline on Augmentin to continue therapy until November 14. The infectious disease provider also recommended that the patient reconsider amputation. Her PCP referred her to the wound care center, as the patient is very reluctant to undergo amputation. She also has a small ulcer on her anterior tibial surface that recently opened after a minor trauma. On exam, her left foot is rolled such that the lateral aspect of her foot is the contact surface. There is a large ulcer with exposed necrotic muscle and  fat. The wound does not probe to bone, but apparently there was bone exposure in the past while she was in the hospital. No malodor or purulent drainage. The wound on her anterior tibial surface is small with just a little slough accumulation. She has modest edema and skin changes consistent with stasis dermatitis. 07/28/2022: The anterior tibial wound is healed. The left plantar foot wound looks a little bit better today. There is significantly less necrotic tissue present. There is an area at the most caudal aspect of the wound that looks like there is ongoing pressure-induced tissue injury. Bone remains covered. 08/11/2022: The wound has deteriorated quite a bit since her last visit. Her husband reports that she has had significantly more drainage, such that he has had to change the dressing multiple times per day. During the course of the visit, she recalled that she had not been taking her Lasix for at least 4 days. There is substantial periwound maceration and further tissue breakdown. There is necrotic tissue at the midportion of the wound and tendon is now exposed underneath this necrotic muscle. Electronic Signature(s) Signed: 08/11/2022 11:53:50 AM By: Fredirick Maudlin MD FACS Entered By: Fredirick Maudlin on 08/11/2022 11:53:49 Stai, [name] ([id number]) [id number] Page 4 of  13 -------------------------------------------------------------------------------- Physical Exam Details Patient Name: Date of Service: Colford, [name] 08/11/2022 11:00 A M Medical Record Number: [id number] Patient Account Number: [id number] Date of Birth/Sex: Treating RN: 12/10/1960 (61 y.o. F) Primary Care Provider: [name] Other Clinician: Referring Provider: Treating Provider/Extender: Louann Sjogren in Treatment: 3 Constitutional No acute distress. Respiratory Normal work of breathing on room air. Notes 08/11/2022: There is substantial periwound maceration and further tissue breakdown. There is necrotic tissue at the midportion of the wound and tendon is now exposed underneath this necrotic muscle. Electronic Signature(s) Signed: 08/11/2022 11:55:00 AM By: Fredirick Maudlin MD FACS Entered By: Fredirick Maudlin on 08/11/2022 11:55:00 -------------------------------------------------------------------------------- Physician Orders Details Patient Name: Date of Service: Sherley, [name] 08/11/2022 11:00 A M Medical Record Number: [id number] Patient Account Number: [id number] Date of Birth/Sex: Treating RN: 1961/01/14 (61 y.o. 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 - Dr.  Celine Ahr - room 2 Return A Anesthetic (In clinic) Topical Lidocaine 5% applied to wound bed - In Clinic Edema Control - Lymphedema / SCD / Other Elevate legs to the level of the heart or above for 30 minutes daily and/or when sitting, a frequency of: Off-Loading Other: - Try and elevate Left leg throughout the day Mathey, Marica M ([id number]) [id number] Page 5 of 13 Wound Treatment Wound #19 - Foot Wound Laterality: Plantar, Left Cleanser: Soap and Water 1 x Per Day/30 Days Discharge Instructions: May shower and wash wound with dial antibacterial soap and water prior to dressing change. Cleanser: Wound Cleanser (Generic) 1 x Per Day/30 Days Discharge Instructions: Cleanse the wound with wound cleanser prior to applying a clean dressing using gauze sponges, not tissue or cotton balls. Prim Dressing: Sorbalgon AG Dressing 6x6 (in/in) 1  x Per Day/30 Days ary Discharge Instructions: Apply to wound bed as instructed Secondary Dressing: Woven Gauze Sponge, Non-Sterile 4x4 in 1 x Per Day/30 Days Discharge Instructions: Apply over primary dressing as directed. Secondary Dressing: Zetuvit Plus 4x8 in 1 x Per Day/30 Days Discharge Instructions: Apply over primary dressing as directed. Secured With: Elastic Bandage 4 inch (ACE bandage) (Generic) 1 x Per Day/30 Days Discharge Instructions: Secure with ACE bandage as directed. Secured With: [employer], 4.5x3.1 (in/yd) (Generic) 1 x Per Day/30 Days Discharge Instructions: Secure with Kerlix as directed. Secured With: 68M Medipore Public affairs consultant Surgical T 2x10 (in/yd) (Generic) 1 x Per Day/30 Days ape Discharge Instructions: Secure with tape as directed. Patient Medications llergies: peanut, morphine, gabapentin, iodine, Victoza, Pepto-Bismol, fish oil, Fish Containing Products, Crestor, lovastatin, metronidazole, Red Yeast Rice A ([place] Purpureus), rotigotine, Atrovent, Suprep Bowel Prep Kit, adhesive tape,  Betadine, Dial soap, Suprep Bowel Prep Kit Notifications Medication Indication Start End 08/11/2022 lidocaine DOSE topical 5 % ointment - ointment topical Electronic Signature(s) Signed: 08/11/2022 11:58:57 AM By: Fredirick Maudlin MD FACS Entered By: Fredirick Maudlin on 08/11/2022 11:55:18 -------------------------------------------------------------------------------- Problem List Details Patient Name: Date of Service: Geesey, [name] 08/11/2022 11:00 A M Medical Record Number: [id number] Patient Account Number: [id number] Date of Birth/Sex: Treating RN: 1961/02/26 (61 y.o. 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 07/19/2022 No Yes M86.9 Osteomyelitis, unspecified 07/19/2022 No Yes E11.621 Type 2 diabetes mellitus with foot ulcer 07/19/2022 No Yes Jessop, Trula M ([id number]) [id number] Page 6 of 13 E66.01 Morbid (severe) obesity due to excess calories 07/19/2022 No Yes M35.00 Sjogren syndrome, unspecified 07/19/2022 No Yes I73.9 Peripheral vascular disease, unspecified 07/19/2022 No Yes L07.86 Chronic diastolic (congestive) heart failure 07/19/2022 No Yes 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 08/11/2022 11:00 A M Medical Record Number: [id number] Patient Account Number: [id number] Date of Birth/Sex: Treating RN: 06-28-1961 (61 y.o. F) Primary Care Provider: [name] Other Clinician: Referring Provider: Treating Provider/Extender: Louann Sjogren in Treatment: 3 Subjective Chief Complaint Information obtained from Patient 10/17/2021; multiple scattered wounds to the abdomen, posterior left leg, left labia and sacrum 07/19/2022: left foot DFU History of Present Illness (HPI) 07/23/2019 on evaluation today patient presents with a myriad of wounds noted at multiple locations over her right heel, right lower extremity, and abdominal region. She also has bilateral lower extremity lymphedema which is quite significant as well. Incidentally she also has congestive heart failure and hypertension. She also is obese. With that being said I think all this is contributing as well to her lower extremity edema which is very much uncontrolled. It seems like its been at least several months since she is worn any compression according to what she tells me. With that being said I am not sure exactly when that would have been. She does have fibrotic changes in the lower extremity secondary to lymphedema worse on the left than the right. She  did show me pictures of wound she had on the anterior portion of her shin which was quite significant fortunately that has healed. These issues have been intermittent at this time. Again I think that with appropriate compression therapy she may actually be doing better than what we are seeing at this point but again I am not really sure that she is ever been extremely compliant with that. Fortunately there is no signs of active infection at this time. No fever chills noted. As far as the abdominal ulcer she initially had one wound that she thinks may have been a bug bite. Subsequently she was put a dressing  on this and she states that the tape pulled skin off on other locations causing other wounds that have not healed that is been about 3 months. The wounds on her lower extremities have been intermittent over 3 years. This includes the heel. 11/19; this is a patient that I have not seen previously. She was admitted to our clinic last week with multiple wounds including several superficial circular areas on her abdomen predominantly right upper quadrant. Also 1 in her umbilicus. She has an area on her right lateral malleolus which was new today. She has bilateral lower extremity edema with very significant stasis dermatitis on the left anterior tibial area. She has tightly adherent skin in her lower extremities probably secondary to cutaneous fibrosis in the area. We put her in compression last week. She comes in with the dressings reasonably saturated. 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 She has multiple areas on her arms from scratching. 12/3; the patient I saw for the first time 2 weeks ago. She has 3 small circular areas on her abdomen 2 in the right upper quadrant one on her umbilicus. We have been using silver alginate to this area. She also had an area on her right lateral malleolus and right heel and right medial malleolus. We have been using silver alginate here under compression. She does not have an open area on the left leg today. We are going to order her stockings for the left leg. She clearly has chronic lymphedema in these areas. X-ray of the foot from 10/20 did not show any fracture or dislocation. The heel wound was noted there was no evidence of osteomyelitis She complains of generalized pruritus. 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 She has longstanding lymphedema. Although she does not have known liver disease I looked at his CT scan of the abdomen from February of this year that showed hepatic steatosis 12/11; patient still has no open area on the left leg we transitioned her into a stocking she had. Her stockings from [employer] are supposed to be arriving later today which will be the stockings of choice. The only wound remaining on the right is the right heel 2 small superficial open areas. Everything else is well on its way to healing in the right leg few small excoriations. She has 1 major area remaining on her abdomen the rest seem to be healing 12/22 patient still has no open area on the left leg and she is still in a stocking. She had still 2 open areas on the tip of her right heel. These look like pressure related areas although the patient  is not really certain how this is happening. She has an open heeled shoe that we provided. Still has 1 open area on the right lateral abdomen The patient has complaints of generalized pruritus. She has multiple excoriated areas on her abdomen that of close over her arms or thighs which I think are from scratching. She tells me that she has never had a basic work-up for this which might include basic lab work, liver functions, kidney functions, thyroid etc. She might also benefit from a dermatologist 12/29; the patient has a deeper larger more painful wound on the tip of her right heel. Again these look like pressure ulcers although she wears an open toed heel pads or heel at night to prevent it from hitting the mattress etc. They tell me and remind me that this is been present on and off for about 2 years that has closed over but then will reopen. I did look over her lab work that was available in [place]. She does not have elevated liver function tests or creatinine. I was not able to see a TSH. This was  in response to her complaints of generalized itching and a itch/scratch cycle. I also noted that she is on OxyContin and oxycodone and of course narcotics can cause generalized pruritus as a side effect Readmission: History From [place], [place] Last Week: 03/29/2021 this is a patient who presents for initial evaluation here in the clinic though I have seen her 1 time previously in 2020 this was in [place]. That was actually her initial visit therefore a heel ulceration. Subsequently that did not healing she actually saw me the 1 time in November and then subsequently saw Dr. Dellia Nims through the end of December where she apparently was healed. 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 This is on the right. With that being said right now she has a basically region on the lateral portion of the ray site where she is applying more pressure especially as her foot seems to be starting to turn in and this has become an increasingly significant issue for her to be honest. She does see Dr. Sharol Given and Dr. Sharol Given has had her on doxycycline for quite a bit of time here. Subsequently she is just now wrapping that out. I think that she probably would be benefited by continue the doxycycline for a time while we can work through what we need to do with things here. She does have evidence of osteomyelitis based on what I saw on the MRI today. This obviously is unfortunate and definitely not something that she was hoping to hear. With that being said I do believe that she would potentially be a strong candidate for hyperbaric oxygen therapy. Also believe she could be a candidate for total contact cast though we have to be cautious due to the fact that how her foot is bending inward I think that this is good to be a little bit of a concern. We will definitely have to keeping a close eye on things. She does have a  history of diabetes mellitus type 2. She also other than the amputation has a medical history positive for hypertension. She has never undergone hyperbaric oxygen therapy previously I did show her the chamber we did talk a little bit about it today as well. Admission [facility]: o 04/06/2021 Patient presents here in [place]  today for evaluation. Again she is establishing care here after I saw her last week in [place]. Obviously I think that she is doing extremely well at this point which is great news and in general I am extremely pleased with where things stand from the standpoint of the appearance of the wound. Obviously this is not significantly smaller but does look a lot cleaner I think using the Hydrofera Blue has been of benefit over the past week. Nonetheless she still has a wound with issues with pressure here which I think is good to be an ongoing issue. Also think that the osteomyelitis which is chronic is also getting ongoing issue. She does want to try to do what she can to prevent this from worsening and ending with a below-knee amputation. T that end I do think that getting her into the hyperbaric oxygen chamber would be what we need to do. She has recently been seen by cardiology o and subsequently well as far as that is concerned. Her ejection fraction was appropriate for hyperbarics and everything seems to be doing great in that regard. She has not had a recent chest x-ray she is a former smoker around 15 years ago she quit. Nonetheless I do believe with her asthma we do want to do a chest x-ray just to make sure everything is okay before proceeding with the hyperbarics although we can go ahead and see about getting the approval. I also think she is going require some sharp debridement and around this wound we also have to contact her insurance for prior approval on this as well. 04/13/2021 upon evaluation today patient appears to be doing a little bit better in regard  to her leg in fact the swelling is dramatically better. Very pleased with where things stand in that regard. Fortunately there does not appear to be any signs of active infection at this time. No fevers, chills, nausea, vomiting, or diarrhea. 04/20/2021 upon evaluation today patient appears to be doing decently well in regard to her foot. There is some need for sharp debridement here today. With that being said we will get a go ahead and proceed with that today as the foot is becoming somewhat macerated with the overhanging callus that is definitely not we want to see. With that being said the patient does have an issue here as well with a boil in the perineal region unfortunately that is an issue for her today as well. I did have a look at that as well. We are still working on dealing with insurance as far as getting hyperbarics approved. 04/27/2021 upon evaluation today patient appears to be doing somewhat poorly in general compared to where she has been. At this point she is having a lot of swelling which is the main concerning thing that I am seeing. There does not appear to be any signs of infection currently which is good news but at the same time I do feel like that she is a lot more swollen than she was even last week and is showing how much she is weeping in regard to the right lower leg. She also has a blister on the left breast although is not an open wound at this time. She continues to have the area in the perineum as well. 05/04/2021 upon evaluation today patient appears to actually be doing decently well in regard to her wound on the foot. Fortunately there is no signs of active infection at this time. No fevers, chills, nausea, vomiting, or  diarrhea. Unfortunately she does have an area on the left breast which is actually appearing to be a burn based on what I see physically. She does note that she uses rice bags for areas in general and may have left one in that area not realizing it  was burning her as she does not really have much feeling. I think this is very probable based on what I am seeing. For that reason working to treat this as such I do think we need to loosen up a lot of the necrotic tissue here. I Am going tosend in a prescription for Santyl for the patient. 9/1; patient presents for HBO today but cannot do treatment due to wheezing and feeling short of breath when laying down. She was set up as a doctor visit today since she was not able to do HBO. She states she feels okay overall and started having a dry cough over the past couple days. She has not tested herself for COVID. She denies fever/chills or sputum production. She thinks her symptoms are related to allergies. She has been using silver alginate to her right plantar foot wound. She has not been using Santyl to the breast wound because she reports forgetting to do this. She uses zinc oxide to her perennial wound. She currently denies systemic signs of infection. 9/8; patient presents for follow-up. She has been a unable to do HBO treatments for the past week due to her back pain. She is currently taking Flexeril to address this issue. She reports no issues with her compression wrap. She has been putting Santyl on the left breast wound and Monistat cream to the perennial wound. She denies signs of infection. 9/15; patient presents for follow-up. Again she is unable to do HBO treatment today due to sinus pain. She has 2 new wounds to her right foot which she is unaware of. She has been putting Santyl on the left breast wound and zinc oxide to the perineal wound. She currently denies signs of infection. Readmission 10/17/2021 Ms. Finola Rosal is a 61 year old female with a past medical history of type 2 diabetes, osteomyelitis status post right BKA and morbid obesity that presents Koltz, Omar M ([id number]) [id number] Page 8 of 13 to the clinic for multiple scattered open wounds to her  body. These are located to the abdomen, left posterior leg and sacral region. She has been using mupirocin ointment, Neosporin and zinc oxide to the wound beds. She has been started on Bactrim and Keflex by her primary care physician and states she has several days left to complete the course. She currently denies systemic signs of infection. READMISSION This is a 61 year old morbidly obese type II diabetic (poorly controlled with last hemoglobin A1c 9.2%). She has congestive heart failure, peripheral vascular disease, hypertension, coronary artery disease, venous stasis, connective tissue disease (o Sjogren's syndrome), and bilateral lower extremity edema. She has been seen here for various reasons in the past. She has a right above-knee amputation. She was recently hospitalized for a left diabetic foot ulcer. Orthopedic surgery was consulted. An MRI was performed that was not conclusive for osteomyelitis, but given the extent of the necrosis, orthopedics recommended amputation. Infectious disease was also consulted and have recommended a 6-week course of oral doxycycline and Augmentin. Apparently wound care was also consulted while she was in the hospital and simply recommended painting the area with Betadine. She saw infectious disease on October 27 with plans to continue doxycycline on Augmentin to continue therapy until November 14.  The infectious disease provider also recommended that the patient reconsider amputation. Her PCP referred her to the wound care center, as the patient is very reluctant to undergo amputation. She also has a small ulcer on her anterior tibial surface that recently opened after a minor trauma. On exam, her left foot is rolled such that the lateral aspect of her foot is the contact surface. There is a large ulcer with exposed necrotic muscle and fat. The wound does not probe to bone, but apparently there was bone exposure in the past while she was in the hospital. No  malodor or purulent drainage. The wound on her anterior tibial surface is small with just a little slough accumulation. She has modest edema and skin changes consistent with stasis dermatitis. 07/28/2022: The anterior tibial wound is healed. The left plantar foot wound looks a little bit better today. There is significantly less necrotic tissue present. There is an area at the most caudal aspect of the wound that looks like there is ongoing pressure-induced tissue injury. Bone remains covered. 08/11/2022: The wound has deteriorated quite a bit since her last visit. Her husband reports that she has had significantly more drainage, such that he has had to change the dressing multiple times per day. During the course of the visit, she recalled that she had not been taking her Lasix for at least 4 days. There is substantial periwound maceration and further tissue breakdown. There is necrotic tissue at the midportion of the wound and tendon is now exposed underneath this necrotic muscle. Patient History Information obtained from Patient. Family History Diabetes - Mother, Heart Disease - Mother,Father,Siblings, Hypertension - Mother,Father,Siblings, Lung Disease - Father, Stroke - Mother, No family history of Cancer, Hereditary Spherocytosis, Kidney Disease, Seizures, Thyroid Problems, Tuberculosis. Social History Former smoker - quit 15 years ago, Marital Status - Married, Alcohol Use - Never, Drug Use - No History, Caffeine Use - Rarely. Medical History Eyes Patient has history of Cataracts - both eyes Denies history of Glaucoma, Optic Neuritis Ear/Nose/Mouth/Throat Denies history of Chronic sinus problems/congestion, Middle ear problems Hematologic/Lymphatic Patient has history of Lymphedema Denies history of Anemia, Hemophilia, Human Immunodeficiency Virus, Sickle Cell Disease Respiratory Patient has history of Asthma Denies history of Aspiration, Chronic Obstructive Pulmonary Disease (COPD),  Pneumothorax, Sleep Apnea, Tuberculosis Cardiovascular Patient has history of Congestive Heart Failure, Coronary Artery Disease, Hypertension, Peripheral Arterial Disease, Peripheral Venous Disease Denies history of Angina, Arrhythmia, Hypotension, Myocardial Infarction, Phlebitis, Vasculitis Gastrointestinal Denies history of Hepatitis A, Hepatitis B, Hepatitis C Endocrine Patient has history of Type II Diabetes Denies history of Type I Diabetes Genitourinary Denies history of End Stage Renal Disease Immunological Denies history of Lupus Erythematosus, Raynaudoos, Scleroderma Integumentary (Skin) Denies history of History of Burn Musculoskeletal Patient has history of Rheumatoid Arthritis Denies history of Gout, Osteoarthritis, Osteomyelitis Neurologic Patient has history of Neuropathy Denies history of Dementia, Quadriplegia, Paraplegia, Seizure Disorder Oncologic Denies history of Received Chemotherapy, Received Radiation Psychiatric Denies history of Anorexia/bulimia, Confinement Anxiety Hospitalization/Surgery History - 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 Sharol Given 01/2020. 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 - 11/22 R AKA. Medical A Surgical History Notes nd Constitutional Symptoms (General Health) hypothyroidism Cardiovascular cardiac stents Gastrointestinal GERD Endocrine Hypothyroidism Immunological Mix connective tissue disease- autoimmune Sjogren's syndrome Musculoskeletal Fibromyalgia, Scoliosis, Glazier, Loreal M ([id number]) [id number] Page 9 of 13 Objective Constitutional No acute distress. Respiratory Normal work of breathing on room air. General Notes: 08/11/2022: There is substantial periwound maceration and further tissue breakdown. There  is necrotic tissue at the midportion of the wound and tendon is now exposed underneath this necrotic muscle. Integumentary (Hair, Skin) Wound #19 status is Open. Original cause of  wound was Gradually Appeared. The date acquired was: 05/17/2022. The wound has been in treatment 3 weeks. The wound is located on the [place]. The wound measures 9.6cm length x 4.2cm width x 0.2cm depth; 31.667cm^2 area and 6.333cm^3 volume. There is Fat Layer (Subcutaneous Tissue) exposed. There is no tunneling or undermining noted. There is a large amount of serosanguineous drainage noted. The wound margin is distinct with the outline attached to the wound base. There is large (67-100%) red, pink granulation within the wound bed. There is a small (1-33%) amount of necrotic tissue within the wound bed including Eschar and Adherent Slough. The periwound skin appearance exhibited: Callus, Maceration, Erythema. The periwound skin appearance did not exhibit: Crepitus, Excoriation, Induration, Rash, Scarring, Dry/Scaly, Atrophie Blanche, Cyanosis, Ecchymosis, Hemosiderin Staining, Mottled, Pallor, Rubor. The surrounding wound skin color is noted with erythema. Periwound temperature was noted as No Abnormality. 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 There was a Excisional Skin/Subcutaneous Tissue/Muscle Debridement with a total area of 21 sq cm performed by Fredirick Maudlin, MD. With the following instrument(s): Curette to remove Non-Viable tissue/material. Material removed includes Muscle, Tendon, Subcutaneous Tissue, and Slough after achieving pain control  using Lidocaine 5% topical ointment. No specimens were taken. A time out was conducted at 11:33, prior to the start of the procedure. A Minimum amount of bleeding was controlled with Pressure. The procedure was tolerated well. Post Debridement Measurements: 9.6cm length x 4.2cm width x 0.2cm depth; 6.333cm^3 volume. Character of Wound/Ulcer Post Debridement is improved. Severity of Tissue Post Debridement is: Fat layer exposed. Post procedure Diagnosis Wound #19: Same as Pre-Procedure General Notes: scribed for Dr. Celine Ahr by [name], RN. Plan Follow-up Appointments: Return Appointment in 1 week. - Dr. Celine Ahr - room 2 Anesthetic: (In clinic) Topical Lidocaine 5% applied to wound bed - In Clinic Edema Control - Lymphedema / SCD / Other: Elevate legs to the level of the heart or above for 30 minutes daily and/or when sitting, a frequency of: Off-Loading: Other: - Try and elevate Left leg throughout the day The following medication(s) was prescribed: lidocaine topical 5 % ointment ointment topical was prescribed at facility WOUND #19: - Foot Wound Laterality: Plantar, Left Cleanser: Soap and Water 1 x Per Day/30 Days Discharge Instructions: May shower and wash wound with dial antibacterial soap and water prior to dressing change. Cleanser: Wound Cleanser (Generic) 1 x Per Day/30 Days Discharge Instructions: Cleanse the wound with wound cleanser prior to applying a clean dressing using gauze sponges, not tissue or cotton balls. Prim Dressing: Sorbalgon AG Dressing 6x6 (in/in) 1 x Per Day/30 Days ary Kolodziej, [name] ([id number]) [id number] Page 10 of 13 Discharge Instructions: Apply to wound bed as instructed Secondary Dressing: Woven Gauze Sponge, Non-Sterile 4x4 in 1 x Per Day/30 Days Discharge Instructions: Apply over primary dressing as directed. Secondary Dressing: Zetuvit Plus 4x8 in 1 x Per Day/30 Days Discharge  Instructions: Apply over primary  dressing as directed. Secured With: Elastic Bandage 4 inch (ACE bandage) (Generic) 1 x Per Day/30 Days Discharge Instructions: Secure with ACE bandage as directed. Secured With: [employer], 4.5x3.1 (in/yd) (Generic) 1 x Per Day/30 Days Discharge Instructions: Secure with Kerlix as directed. Secured With: 25M Medipore Public affairs consultant Surgical T 2x10 (in/yd) (Generic) 1 x Per Day/30 Days ape Discharge Instructions: Secure with tape as directed. 08/11/2022: There is substantial periwound maceration and further tissue breakdown. There is necrotic tissue at the midportion of the wound and tendon is now exposed underneath this necrotic muscle. I used a curette to debride slough, macerated skin, necrotic muscle and tendon. I then took a culture to determine whether or not infection is playing a contributing role to the wound deterioration. I suspect that the main problem is that she did not take her diuretic for a number of days and as result, all of the fluid that would normally have been eliminated and her urine came out through her foot. We will continue silver alginate but we will add Zetuvit to provide more absorption. The patient was reminded of the importance of taking her diuretics appropriately. Follow-up in 1 week. Electronic Signature(s) Signed: 08/11/2022 11:57:48 AM By: Fredirick Maudlin MD FACS Entered By: Fredirick Maudlin on 08/11/2022 11:57:48 -------------------------------------------------------------------------------- HxROS Details Patient Name: Date of Service: Laplante, [name] 08/11/2022 11:00 A M Medical Record Number: [id number] Patient Account Number: [id number] Date of Birth/Sex: Treating RN: [birth year] (61 y.o. F) Primary Care Provider: [name] Other Clinician: Referring Provider: Treating Provider/Extender: Louann Sjogren in Treatment: 3 Information Obtained From Patient Constitutional Symptoms (General Health) Medical  History: Past Medical History Notes: hypothyroidism Eyes Medical History: Positive for: Cataracts - both eyes Negative for: Glaucoma; Optic Neuritis Ear/Nose/Mouth/Throat Medical History: Negative for: Chronic sinus problems/congestion; Middle ear problems Hematologic/Lymphatic Medical History: Positive for: Lymphedema Negative for: Anemia; Hemophilia; Human Immunodeficiency Virus; Sickle Cell Disease Respiratory Medical History: Positive for: Asthma Negative for: Aspiration; Chronic Obstructive Pulmonary Disease (COPD); Pneumothorax; Sleep Apnea; Tuberculosis Cardiovascular Requejo, Junetta M ([id number]) [id number] Page 11 of 13 Medical History: Positive for: Congestive Heart Failure; Coronary Artery Disease; Hypertension; Peripheral Arterial Disease; Peripheral Venous Disease Negative for: Angina; Arrhythmia; Hypotension; Myocardial Infarction; Phlebitis; Vasculitis Past Medical History Notes: cardiac stents Gastrointestinal Medical History: Negative for: Hepatitis A; Hepatitis B; Hepatitis C Past Medical History Notes: GERD Endocrine Medical History: Positive for: Type II Diabetes Negative for: Type I Diabetes Past Medical History Notes: Hypothyroidism Time with diabetes: since 2002 Treated with: Insulin, Oral agents Blood sugar tested every day: Yes Tested : 2-3 times a day Genitourinary Medical History: Negative for: End Stage Renal Disease Immunological Medical History: Negative for: Lupus Erythematosus; Raynauds; Scleroderma 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 Integumentary (Skin) Medical History: Negative for: History of Burn Musculoskeletal Medical History: Positive for: Rheumatoid Arthritis Negative for: Gout; Osteoarthritis; Osteomyelitis Past Medical History Notes: Fibromyalgia, Scoliosis, Neurologic Medical History: Positive for: Neuropathy Negative for: Dementia;  Quadriplegia; Paraplegia; Seizure Disorder Oncologic Medical History: Negative for: Received Chemotherapy; Received Radiation Psychiatric Medical History: Negative for: Anorexia/bulimia; Confinement Anxiety HBO Extended History Items Eyes: Cataracts Immunizations Pneumococcal Vaccine: Received Pneumococcal Vaccination: Yes Received Pneumococcal Vaccination On or After 60th Birthday: Yes Implantable Devices None Morken, Davona M ([id number]) [id number] Page 12 of 13 Hospitalization / Surgery History Type of Hospitalization/Surgery 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 Family and Social  History Cancer: No; Diabetes: Yes - Mother; Heart Disease: Yes - Mother,Father,Siblings; Hereditary Spherocytosis: No; Hypertension: Yes - Mother,Father,Siblings; Kidney Disease: No; Lung Disease: Yes - Father; Seizures: No; Stroke: Yes - Mother; Thyroid Problems: No; Tuberculosis: No; Former smoker - quit 15 years ago; Marital Status - Married; Alcohol Use: Never; Drug Use: No History; Caffeine Use: Rarely; Financial Concerns: No; Food, Clothing or Shelter Needs: No; Support System Lacking: No; Transportation Concerns: No Electronic Signature(s) Signed: 08/11/2022 11:58:57 AM By: Fredirick Maudlin MD FACS Entered By: Fredirick Maudlin on 08/11/2022 11:53:56 -------------------------------------------------------------------------------- SuperBill Details Patient Name: Date of Service: Ragone, [name] 08/11/2022 Medical Record Number: [id number] Patient Account Number: [id number] Date of Birth/Sex: Treating RN: 10-14-1960 (61 y.o. 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 Facility Procedures : 3 CPT4 Code: [id number] Description: [place] - DEB MUSC/FASCIA 20 SQ CM/< ICD-10 Diagnosis Description 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 Modifier: Quantity: 1 : 3 CPT4 Code: 1324401 Description: 11046 - DEB MUSC/FASCIA EA ADDL 20 CM ICD-10 Diagnosis Description 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

## 2022-08-11 NOTE — Progress Notes (Signed)
Cimmino, [name] ([id number]) [id number] Page 1 of 7 Visit Report for 08/11/2022 Arrival Information Details Patient Name: Date of Service: [name], [name]. 08/11/2022 11:00 A [name] Medical Record Number: [id number] Patient Account Number: [id number] Date of Birth/Sex: Treating RN: 07/06/1961 (61 y.o. [name] Primary Care Kori Goins: [name] Other Clinician: Referring Judi Jaffe: Treating Birdie Fetty/Extender: Vivien Rossetti in Treatment: 3 Visit Information History Since Last Visit Added or deleted any medications: No Patient Arrived: Wheel Chair Any new allergies or adverse reactions: No Arrival Time: 11:18 Had a fall or experienced change in No Accompanied By: family activities of daily living that may affect Transfer Assistance: None risk of falls: Patient Identification Verified: Yes Signs or symptoms of abuse/neglect since last visito No Secondary Verification Process Completed: Yes Hospitalized since last visit: No Implantable device outside of the clinic excluding No cellular tissue based products placed in the center since last visit: Has Dressing in Place as Prescribed: Yes Has Compression in Place as Prescribed: Yes Pain Present Now: Yes Electronic Signature(s) Signed: 08/11/2022 3:37:43 PM By: Samuella Bruin Entered By: Samuella Bruin on 08/11/2022 11:18:31 -------------------------------------------------------------------------------- Lower Extremity Assessment Details Patient Name: Date of Service: [name], [name] 08/11/2022 11:00 A [name] Medical Record Number: [id number] Patient Account Number: [id number] Date of Birth/Sex: Treating RN: 08/02/1961 (61 y.o. [name] Primary Care Sweet Jarvis: [name] Other Clinician: Referring [name]: Treating Anastasio Wogan/Extender: Vivien Rossetti in Treatment: 3 Edema Assessment Assessed: [Left: No] [Right:  No] Edema: [Left: Ye] [Right: s] Calf Left: Right: Point of Measurement: 30 cm From Medial Instep 45.5 cm Ankle Left: Right: Point of Measurement: 9 cm From Medial Instep 24.5 cm Electronic Signature(s) Signed: 08/11/2022 3:37:43 PM By: Samuella Bruin Entered By: Samuella Bruin on 08/11/2022 11:20:32 [name], [name] ([id number]) [id number] Page 2 of 7 -------------------------------------------------------------------------------- Multi Wound Chart Details Patient Name: Date of Service: [name], [name] 08/11/2022 11:00 A [name] Medical Record Number: [id number] Patient Account Number: [id number] Date of Birth/Sex: Treating RN: [birth year] (62 y.o. F) Primary Care [name]: [name] Other Clinician: Referring [name]: Treating Gearald Stonebraker/Extender: Vivien Rossetti in Treatment: 3 [19:Photos:] [N/A:N/A] Left, Plantar Foot N/A N/A Wound Location: Gradually Appeared N/A N/A Wounding Event: Diabetic Wound/Ulcer of the Lower N/A N/A Primary Etiology: Extremity Cataracts, Lymphedema, Asthma, N/A N/A Comorbid History: Congestive Heart Failure, Coronary Artery Disease, Hypertension, Peripheral Arterial Disease, Peripheral Venous Disease, Type II Diabetes, Rheumatoid Arthritis, Neuropathy 05/17/2022 N/A N/A Date Acquired: 3 N/A N/A Weeks of Treatment: Open N/A N/A Wound Status: No N/A N/A Wound Recurrence: 9.6x4.2x0.2 N/A N/A Measurements L x W x D (cm) 31.667 N/A N/A A (cm) : rea 6.333 N/A N/A Volume (cm) : -41.00% N/A N/A % Reduction in A rea: 29.50% N/A N/A % Reduction in Volume: Grade 2 N/A N/A Classification: Large N/A N/A Exudate A mount: Serosanguineous N/A N/A Exudate Type: red, brown N/A N/A Exudate Color: Distinct, outline attached N/A N/A Wound Margin: Large (67-100%) N/A N/A Granulation A mount: Red, Pink N/A N/A Granulation Quality: Small (1-33%) N/A N/A Necrotic A mount: Eschar,  Adherent Slough N/A N/A Necrotic Tissue: Fat Layer (Subcutaneous Tissue): Yes N/A N/A Exposed Structures: Fascia: No Tendon: No Muscle: No Joint: No Bone: No None N/A N/A Epithelialization: Debridement - Excisional N/A N/A Debridement: Pre-procedure Verification/Time Out 11:33 N/A N/A Taken: Lidocaine 5% topical ointment N/A N/A Pain Control: Muscle, Tendon, Subcutaneous, Slough N/A N/A Tissue Debrided: Skin/Subcutaneous Tissue/Muscle N/A N/A Level: 21 N/A N/A Debridement A (sq cm): rea Curette  N/A N/A Instrument: Minimum N/A N/A Bleeding: Pressure N/A N/A Hemostasis A chieved: Procedure was tolerated well N/A N/A Debridement Treatment Response: 9.6x4.2x0.2 N/A N/A Post Debridement Measurements L x W x D (cm) 6.333 N/A N/A Post Debridement Volume: (cm) Callus: Yes N/A N/A Periwound Skin Texture: Excoriation: No Induration: No Crepitus: No Rash: No Scarring: No Studstill, [name] ([id number]) [id number] Page 3 of 7 Maceration: Yes N/A N/A Periwound Skin Moisture: Dry/Scaly: No Erythema: Yes N/A N/A Periwound Skin Color: Atrophie Blanche: No Cyanosis: No Ecchymosis: No Hemosiderin Staining: No Mottled: No Pallor: No Rubor: No No Abnormality N/A N/A Temperature: Debridement N/A N/A Procedures Performed: Treatment Notes Wound #19 (Foot) Wound Laterality: Plantar, Left Cleanser Soap and Water Discharge Instruction: May shower and wash wound with dial antibacterial soap and water prior to dressing change. Wound Cleanser Discharge Instruction: Cleanse the wound with wound cleanser prior to applying a clean dressing using gauze sponges, not tissue or cotton balls. Peri-Wound Care Topical Primary Dressing Sor[employer] Dressing 6x6 (in/in) Discharge Instruction: Apply to wound bed as instructed Secondary Dressing Woven Gauze Sponge, Non-Sterile 4x4 in Discharge Instruction: Apply over primary dressing as directed. Zetuvit Plus  4x8 in Discharge Instruction: Apply over primary dressing as directed. Secured With Elastic Bandage 4 inch (ACE bandage) Discharge Instruction: Secure with ACE bandage as directed. Kerlix Roll Sterile, 4.5x3.1 (in/yd) Discharge Instruction: Secure with Kerlix as directed. 19M Medipore Soft Cloth Surgical T 2x10 (in/yd) ape Discharge Instruction: Secure with tape as directed. Compression Wrap Compression Stockings Add-Ons Electronic Signature(s) Signed: 08/11/2022 11:52:16 AM By: Duanne Guess MD FACS Entered By: Duanne Guess on 08/11/2022 11:52:16 -------------------------------------------------------------------------------- Multi-Disciplinary Care Plan Details Patient Name: Date of Service: [name], [name] 08/11/2022 11:00 A [name] Medical Record Number: [id number] Patient Account Number: [id number] Date of Birth/Sex: Treating RN: May 29, 1961 (61 y.o. [name] Primary Care Wilder Kurowski: [name] Other Clinician: Referring Khianna Blazina: Treating Zebedee Segundo/Extender: Vivien Rossetti in Treatment: 3 Active Inactive Delfavero, Miachel Roux ([id number]) [id number] Page 4 of 7 Venous Leg Ulcer Nursing Diagnoses: Potential for venous Insuffiency (use before diagnosis confirmed) Goals: Patient will maintain optimal edema control Date Initiated: 07/19/2022 Target Resolution Date: 09/10/2022 Goal Status: Active Interventions: Assess peripheral edema status every visit. Treatment Activities: Therapeutic compression applied : 07/19/2022 Notes: Electronic Signature(s) Signed: 08/11/2022 3:37:43 PM By: Samuella Bruin Entered By: Samuella Bruin on 08/11/2022 11:24:40 -------------------------------------------------------------------------------- Pain Assessment Details Patient Name: Date of Service: [name], [name] 08/11/2022 11:00 A [name] Medical Record Number: [id number] Patient Account Number: [id number] Date of  Birth/Sex: Treating RN: [birth year] (61 y.o. [name] Primary Care Jorgina Binning: [name] Other Clinician: Referring Tecumseh Yeagley: Treating Auriella Wieand/Extender: Vivien Rossetti in Treatment: 3 Active Problems Location of Pain Severity and Description of Pain Patient Has Paino Yes Site Locations Pain Location: Generalized Pain, Pain in Ulcers Rate the pain. Current Pain Level: 8 Pain Management and Medication Current Pain Management: Electronic Signature(s) Signed: 08/11/2022 3:37:43 PM By: Samuella Bruin Entered By: Samuella Bruin on 08/11/2022 11:19:01 Rogala, Delphia [name] ([id number]) [id number] Page 5 of 7 -------------------------------------------------------------------------------- Patient/Caregiver Education Details Patient Name: Date of Service: [name], [name]. 12/1/2023andnbsp11:00 A [name] Medical Record Number: [id number] Patient Account Number: [id number] Date of Birth/Gender: Treating RN: [birth year] (61 y.o. [name] Primary Care Physician: [name] Other Clinician: Referring Physician: Treating Physician/Extender: Vivien Rossetti in Treatment: 3 Education Assessment Education Provided To: Patient Education Topics Provided Wound/Skin Impairment: Methods: Explain/Verbal Responses: Reinforcements needed, State content correctly Electronic Signature(s) Signed: 08/11/2022  3:37:43 PM By: Gelene Mink By: Samuella Bruin on 08/11/2022 11:24:53 -------------------------------------------------------------------------------- Wound Assessment Details Patient Name: Date of Service: [name], [name] 08/11/2022 11:00 A [name] Medical Record Number: [id number] Patient Account Number: [id number] Date of Birth/Sex: Treating RN: 1961-06-28 (61 y.o. [name] Primary Care [name]: [name] Other Clinician: Referring  [name]: Treating Davine Sweney/Extender: Vivien Rossetti in Treatment: 3 Wound Status Wound Number: 19 Primary Diabetic Wound/Ulcer of the Lower Extremity Etiology: Wound Location: Left, Plantar Foot Wound Open Wounding Event: Gradually Appeared Status: Date Acquired: 05/17/2022 Comorbid Cataracts, Lymphedema, Asthma, Congestive Heart Failure, Weeks Of Treatment: 3 History: Coronary Artery Disease, Hypertension, Peripheral Arterial Disease, Clustered Wound: No Peripheral Venous Disease, Type II Diabetes, Rheumatoid Arthritis, Neuropathy Photos Wound Measurements Length: (cm) 9.6 [name], [name] ([id number]) Width: (cm) 4.2 Depth: (cm) 0.2 Area: (cm) 31.667 Volume: (cm) 6.333 % Reduction in Area: -41% [id number] Page 6 of 7 % Reduction in Volume: 29.5% Epithelialization: None Tunneling: No Undermining: No Wound Description Classification: Grade 2 Wound Margin: Distinct, outline attached Exudate Amount: Large Exudate Type: Serosanguineous Exudate Color: red, brown Foul Odor After Cleansing: No Slough/Fibrino Yes Wound Bed Granulation Amount: Large (67-100%) Exposed Structure Granulation Quality: Red, Pink Fascia Exposed: No Necrotic Amount: Small (1-33%) Fat Layer (Subcutaneous Tissue) Exposed: Yes Necrotic Quality: Eschar, Adherent Slough Tendon Exposed: No Muscle Exposed: No Joint Exposed: No Bone Exposed: No Periwound Skin Texture Texture Color No Abnormalities Noted: No No Abnormalities Noted: No Callus: Yes Atrophie Blanche: No Crepitus: No Cyanosis: No Excoriation: No Ecchymosis: No Induration: No Erythema: Yes Rash: No Hemosiderin Staining: No Scarring: No Mottled: No Pallor: No Moisture Rubor: No No Abnormalities Noted: No Dry / Scaly: No Temperature / Pain Maceration: Yes Temperature: No Abnormality Treatment Notes Wound #19 (Foot) Wound Laterality: Plantar, Left Cleanser Soap and  Water Discharge Instruction: May shower and wash wound with dial antibacterial soap and water prior to dressing change. Wound Cleanser Discharge Instruction: Cleanse the wound with wound cleanser prior to applying a clean dressing using gauze sponges, not tissue or cotton balls. Peri-Wound Care Topical Primary Dressing [employer] Dressing 6x6 (in/in) Discharge Instruction: Apply to wound bed as instructed Secondary Dressing Woven Gauze Sponge, Non-Sterile 4x4 in Discharge Instruction: Apply over primary dressing as directed. Zetuvit Plus 4x8 in Discharge Instruction: Apply over primary dressing as directed. Secured With Elastic Bandage 4 inch (ACE bandage) Discharge Instruction: Secure with ACE bandage as directed. Kerlix Roll Sterile, 4.5x3.1 (in/yd) Discharge Instruction: Secure with Kerlix as directed. 46M Medipore Soft Cloth Surgical T 2x10 (in/yd) ape Discharge Instruction: Secure with tape as directed. Compression Wrap Compression Stockings Add-Ons [name], Alix [name] ([id number]) [id number] Page 7 of 7 Electronic Signature(s) Signed: 08/11/2022 3:37:43 PM By: Samuella Bruin Entered By: Samuella Bruin on 08/11/2022 11:27:34 -------------------------------------------------------------------------------- Vitals Details Patient Name: Date of Service: Kiener, [name] 08/11/2022 11:00 A [name] Medical Record Number: [id number] Patient Account Number: [id number] Date of Birth/Sex: Treating RN: 03/11/1961 (61 y.o. [name] Primary Care Armoni Depass: [name] Other Clinician: Referring Jorey Dollard: Treating Laban Orourke/Extender: Vivien Rossetti in Treatment: 3 Vital Signs Time Taken: 11:58 Temperature (F): 98.1 Height (in): 62 Pulse (bpm): 83 Weight (lbs): 284 Respiratory Rate (breaths/min): 18 Body Mass Index (BMI): 51.9 Blood Pressure (mmHg): 101/55 Reference Range: 80 - 120 mg / dl Electronic  Signature(s) Signed: 08/11/2022 3:37:43 PM By: Samuella Bruin Entered By: Samuella Bruin on 08/11/2022 11:59:07

## 2022-08-14 ENCOUNTER — Ambulatory Visit: Payer: Commercial Managed Care - HMO | Admitting: Infectious Diseases

## 2022-08-17 ENCOUNTER — Encounter (HOSPITAL_BASED_OUTPATIENT_CLINIC_OR_DEPARTMENT_OTHER): Payer: Commercial Managed Care - HMO | Admitting: General Surgery

## 2022-08-17 DIAGNOSIS — E11621 Type 2 diabetes mellitus with foot ulcer: Secondary | ICD-10-CM | POA: Diagnosis not present

## 2022-08-18 NOTE — Progress Notes (Signed)
[name] ([id number]) [id number] Page 1 of 13 Visit Report for 08/17/2022 Chief Complaint Document Details Patient Name: Date of Service: [name], [name]. 08/17/2022 8:15 A [name] Medical Record Number: [id number] Patient Account Number: [id number] Date of Birth/Sex: Treating RN: [birth year]/[name] (61 y.o. F) Primary Care Provider: [name] Other Clinician: Referring Provider: Treating Provider/Extender: Louann Sjogren in Treatment: 4 Information Obtained from: Patient Chief Complaint 10/17/2021; multiple scattered wounds to the abdomen, posterior left leg, left labia and sacrum 07/19/2022: left foot DFU Electronic Signature(s) Signed: 08/17/2022 8:56:52 AM By: Fredirick Maudlin MD FACS Entered By: Fredirick Maudlin on 08/17/2022 08:56:51 -------------------------------------------------------------------------------- Debridement Details Patient Name: Date of Service: [name], [name] 08/17/2022 8:15 A [name] Medical Record Number: [id number] Patient Account Number: [id number] Date of Birth/Sex: Treating RN: [birth year]/[name] (61 y.o. [name] Primary Care Provider: [name] Other Clinician: Referring Provider: Treating Provider/Extender: Louann Sjogren in Treatment: 4 Debridement Performed for Assessment: Wound #19 [place] Performed By: Physician Fredirick Maudlin, MD Debridement Type: Debridement Severity of Tissue Pre Debridement: Fat layer exposed Level of Consciousness (Pre-procedure): Awake and Alert Pre-procedure Verification/Time Out Yes - 08:46 Taken: Start Time: 08:47 Pain Control: Lidocaine 5% topical ointment T Area Debrided (L x W): otal 8 (cm) x 4.2 (cm) = 33.6 (cm) Tissue and other material debrided: Viable, Non-Viable, Muscle, Subcutaneous, Tendon Level: Skin/Subcutaneous Tissue/Muscle Debridement Description: Excisional Instrument: Curette Bleeding:  Moderate Hemostasis Achieved: Pressure Response to Treatment: Procedure was tolerated well Level of Consciousness (Post- Awake and Alert procedure): Post Debridement Measurements of Total Wound Length: (cm) 10.2 Width: (cm) 4.2 Depth: (cm) 0.2 Volume: (cm) 6.729 Character of Wound/Ulcer Post Debridement: Requires Further Debridement Severity of Tissue Post Debridement: Fat layer exposed [name], [name] ([id number]) [id number] Page 2 of 13 Post Procedure Diagnosis Same as Pre-procedure Notes Scribed for Dr. Celine Ahr by Blanche East, RN Electronic Signature(s) Signed: 08/17/2022 10:43:45 AM By: Fredirick Maudlin MD FACS Signed: 08/17/2022 4:[name]:33 PM By: Blanche East RN Entered By: Blanche East on 08/17/2022 08:49:09 -------------------------------------------------------------------------------- HPI Details Patient Name: Date of Service: [name], [name] 08/17/2022 8:15 A [name] Medical Record Number: [id number] Patient Account Number: [id number] Date of Birth/Sex: Treating RN: [birth year], [name] (61 y.o. F) Primary Care Provider: [name] Other Clinician: Referring Provider: Treating Provider/Extender: Louann Sjogren in Treatment: 4 History of Present Illness HPI Description: 07/23/2019 on evaluation today patient presents with a myriad of wounds noted at multiple locations over her right heel, right lower extremity, and abdominal region. She also has bilateral lower extremity lymphedema which is quite significant as well. Incidentally she also has congestive heart failure and hypertension. She also is obese. With that being said I think all this is contributing as well to her lower extremity edema which is very much uncontrolled. It seems like its been at least several months since she is worn any compression according to what she tells me. With that being said I am not sure exactly when that would have been. She does have  fibrotic changes in the lower extremity secondary to lymphedema worse on the left than the right. She did show me pictures of wound she had on the anterior portion of her shin which was quite significant fortunately that has healed. These issues have been intermittent at this time. Again I think that with appropriate compression therapy she may actually be doing better than what we are seeing at this point but again I am not really sure that she is ever been  extremely compliant with that. Fortunately there is no signs of active infection at this time. No fever chills noted. As far as the abdominal ulcer she initially had one wound that she thinks may have been a bug bite. Subsequently she was put a dressing on this and she states that the tape pulled skin off on other locations causing other wounds that have not healed that is been about 3 months. The wounds on her lower extremities have been intermittent over 3 years. This includes the heel. 11/19; this is a patient that I have not seen previously. She was admitted to our clinic last week with multiple wounds including several superficial circular areas on her abdomen predominantly right upper quadrant. Also 1 in her umbilicus. She has an area on her right lateral malleolus which was new today. She has bilateral lower extremity edema with very significant stasis dermatitis on the left anterior tibial area. She has tightly adherent skin in her lower extremities probably secondary to cutaneous fibrosis in the area. We put her in compression last week. She comes in with the dressings reasonably saturated. 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 She has multiple areas on her arms from scratching. 12/3; the patient I saw for the first time 2 weeks ago. She has  3 small circular areas on her abdomen 2 in the right upper quadrant one on her umbilicus. We have been using silver alginate to this area. She also had an area on her right lateral malleolus and right heel and right medial malleolus. We have been using silver alginate here under compression. She does not have an open area on the left leg today. We are going to order her stockings for the left leg. She clearly has chronic lymphedema in these areas. X-ray of the foot from 10/20 did not show any fracture or dislocation. The heel wound was noted there was no evidence of osteomyelitis She complains of generalized pruritus. 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 She has longstanding lymphedema. Although she does not have known liver disease I looked at his CT scan of the abdomen from February of this year that showed hepatic steatosis 12/11; patient still has no open area on the left leg we transitioned her into a stocking she had. Her stockings from [facility] are supposed to be arriving later today which will be the stockings of choice. The only wound remaining on the right is the right heel 2 small superficial open areas. Everything else is well on its way to healing in the right leg few small excoriations. She has 1 major area remaining on her abdomen the rest seem to be healing 12/22 patient still has no open area on the left leg and she is still in a stocking. She had still 2 open areas on the tip of her right heel. These look like pressure related areas although the patient is not really certain how this is happening. She has an open heeled shoe that we provided. Still has 1 open area on the right lateral abdomen The patient has complaints of generalized pruritus. She has multiple excoriated areas on her abdomen that of close over her arms or thighs which I think are from scratching. She tells me that she has never had a basic work-up for this which might include  basic lab  work, liver functions, kidney functions, thyroid etc. She might also benefit from a dermatologist 12/29; the patient has a deeper larger more painful wound on the tip of her right heel. Again these look like pressure ulcers although she wears an open toed heel pads or heel at night to prevent it from hitting the mattress etc. They tell me and remind me that this is been present on and off for about 2 years that has closed over but then will reopen. I did look over her lab work that was available in [place]. She does not have elevated liver function tests or creatinine. I was not able to see a TSH. This was in response to her complaints of generalized itching and a itch/scratch cycle. I also noted that she is on OxyContin and oxycodone and of course narcotics can cause generalized pruritus as a side effect Readmission: History From [place], [place] Last Week: 03/29/2021 this is a patient who presents for initial evaluation here in the clinic though I have seen her 1 time previously in 2020 this was in [place]. That was actually her initial visit therefore a heel ulceration. Subsequently that did not healing she actually saw me the 1 time in November and then subsequently saw Dr. Dellia Nims through the end of December where she apparently was healed. 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 This is on the right. With that being said right now she has a basically region on the lateral portion of the ray site where she is applying more pressure especially as her foot seems to be starting to turn in and this has become an increasingly [name], [name] ([id number]) [id number] Page 3 of 13 significant issue for her to be honest. She does see Dr. Sharol Given and Dr. Sharol Given has had her on doxycycline for quite a bit of time here. Subsequently she is just now wrapping  that out. I think that she probably would be benefited by continue the doxycycline for a time while we can work through what we need to do with things here. She does have evidence of osteomyelitis based on what I saw on the MRI today. This obviously is unfortunate and definitely not something that she was hoping to hear. With that being said I do believe that she would potentially be a strong candidate for hyperbaric oxygen therapy. Also believe she could be a candidate for total contact cast though we have to be cautious due to the fact that how her foot is bending inward I think that this is good to be a little bit of a concern. We will definitely have to keeping a close eye on things. She does have a history of diabetes mellitus type 2. She also other than the amputation has a medical history positive for hypertension. She has never undergone hyperbaric oxygen therapy previously I did show her the chamber we did talk a little bit about it today as well. Admission [facility]: o 04/06/2021 Patient presents here in [place] today for evaluation. Again she is establishing care here after I saw her last week in [place]. Obviously I think that she is doing extremely well at this point which is great news and in general I am extremely pleased with where things stand from the standpoint of the appearance of the wound. Obviously this is not significantly smaller but does look a lot cleaner I  think using the [facility] has been of benefit over the past week. Nonetheless she still has a wound with issues with pressure here which I think is good to be an ongoing issue. Also think that the osteomyelitis which is chronic is also getting ongoing issue. She does want to try to do what she can to prevent this from worsening and ending with a below-knee amputation. T that end I do think that getting her into the hyperbaric oxygen chamber would be what we need to do. She has recently been seen by  cardiology o and subsequently well as far as that is concerned. Her ejection fraction was appropriate for hyperbarics and everything seems to be doing great in that regard. She has not had a recent chest x-ray she is a former smoker around 15 years ago she quit. Nonetheless I do believe with her asthma we do want to do a chest x-ray just to make sure everything is okay before proceeding with the hyperbarics although we can go ahead and see about getting the approval. I also think she is going require some sharp debridement and around this wound we also have to contact her insurance for prior approval on this as well. 04/13/2021 upon evaluation today patient appears to be doing a little bit better in regard to her leg in fact the swelling is dramatically better. Very pleased with where things stand in that regard. Fortunately there does not appear to be any signs of active infection at this time. No fevers, chills, nausea, vomiting, or diarrhea. 04/20/2021 upon evaluation today patient appears to be doing decently well in regard to her foot. There is some need for sharp debridement here today. With that being said we will get a go ahead and proceed with that today as the foot is becoming somewhat macerated with the overhanging callus that is definitely not we want to see. With that being said the patient does have an issue here as well with a boil in the perineal region unfortunately that is an issue for her today as well. I did have a look at that as well. We are still working on dealing with insurance as far as getting hyperbarics approved. 04/27/2021 upon evaluation today patient appears to be doing somewhat poorly in general compared to where she has been. At this point she is having a lot of swelling which is the main concerning thing that I am seeing. There does not appear to be any signs of infection currently which is good news but at the same time I do feel like that she is a lot more swollen than  she was even last week and is showing how much she is weeping in regard to the right lower leg. She also has a blister on the left breast although is not an open wound at this time. She continues to have the area in the perineum as well. 05/04/2021 upon evaluation today patient appears to actually be doing decently well in regard to her wound on the foot. Fortunately there is no signs of active infection at this time. No fevers, chills, nausea, vomiting, or diarrhea. Unfortunately she does have an area on the left breast which is actually appearing to be a burn based on what I see physically. She does note that she uses rice bags for areas in general and may have left one in that area not realizing it was burning her as she does not really have much feeling. I think this is very probable  based on what I am seeing. For that reason working to treat this as such I do think we need to loosen up a lot of the necrotic tissue here. I Am going tosend in a prescription for Santyl for the patient. 9/1; patient presents for HBO today but cannot do treatment due to wheezing and feeling short of breath when laying down. She was set up as a doctor visit today since she was not able to do HBO. She states she feels okay overall and started having a dry cough over the past couple days. She has not tested herself for COVID. She denies fever/chills or sputum production. She thinks her symptoms are related to allergies. She has been using silver alginate to her right plantar foot wound. She has not been using Santyl to the breast wound because she reports forgetting to do this. She uses zinc oxide to her perennial wound. She currently denies systemic signs of infection. 9/8; patient presents for follow-up. She has been a unable to do HBO treatments for the past week due to her back pain. She is currently taking Flexeril to address this issue. She reports no issues with her compression wrap. She has been putting Santyl on  the left breast wound and Monistat cream to the perennial wound. She denies signs of infection. 9/15; patient presents for follow-up. Again she is unable to do HBO treatment today due to sinus pain. She has 2 new wounds to her right foot which she is unaware of. She has been putting Santyl on the left breast wound and zinc oxide to the perineal wound. She currently denies signs of infection. Readmission 10/17/2021 Ms. Anetha Slagel is a 61 year old female with a past medical history of type 2 diabetes, osteomyelitis status post right BKA and morbid obesity that presents to the clinic for multiple scattered open wounds to her body. These are located to the abdomen, left posterior leg and sacral region. She has been using mupirocin ointment, Neosporin and zinc oxide to the wound beds. She has been started on Bactrim and Keflex by her primary care physician and states she has several days left to complete the course. She currently denies systemic signs of infection. READMISSION This is a 61 year old morbidly obese type II diabetic (poorly controlled with last hemoglobin A1c 9.2%). She has congestive heart failure, peripheral vascular disease, hypertension, coronary artery disease, venous stasis, connective tissue disease (o Sjogren's syndrome), and bilateral lower extremity edema. She has been seen here for various reasons in the past. She has a right above-knee amputation. She was recently hospitalized for a left diabetic foot ulcer. Orthopedic surgery was consulted. An MRI was performed that was not conclusive for osteomyelitis, but given the extent of the necrosis, orthopedics recommended amputation. Infectious disease was also consulted and have recommended a 6-week course of oral doxycycline and Augmentin. Apparently wound care was also consulted while she was in the hospital and simply recommended painting the area with Betadine. She saw infectious disease on October 27 with plans to continue  doxycycline on Augmentin to continue therapy until November 14. The infectious disease provider also recommended that the patient reconsider amputation. Her PCP referred her to the wound care center, as the patient is very reluctant to undergo amputation. She also has a small ulcer on her anterior tibial surface that recently opened after a minor trauma. On exam, her left foot is rolled such that the lateral aspect of her foot is the contact surface. There is a large ulcer with exposed necrotic  muscle and fat. The wound does not probe to bone, but apparently there was bone exposure in the past while she was in the hospital. No malodor or purulent drainage. The wound on her anterior tibial surface is small with just a little slough accumulation. She has modest edema and skin changes consistent with stasis dermatitis. 07/28/2022: The anterior tibial wound is healed. The left plantar foot wound looks a little bit better today. There is significantly less necrotic tissue present. There is an area at the most caudal aspect of the wound that looks like there is ongoing pressure-induced tissue injury. Bone remains covered. 08/11/2022: The wound has deteriorated quite a bit since her last visit. Her husband reports that she has had significantly more drainage, such that he has had to change the dressing multiple times per day. During the course of the visit, she recalled that she had not been taking her Lasix for at least 4 days. There is substantial periwound maceration and further tissue breakdown. There is necrotic tissue at the midportion of the wound and tendon is now exposed underneath this necrotic muscle. 08/17/2022: The wound looks better this week. There is still an area of necrotic muscle and tendon in the midfoot, but the forefoot has improved with some epithelium beginning to come in from the margins. Unfortunately, there is evidence of ongoing pressure-induced tissue injury near the heel. The  patient does admit to resting her heel on a stool frequently. She is still taking Bactrim as prescribed and her Keystone topical antibiotic compound should arrive at their home today. Electronic Signature(s) Signed: 08/17/2022 8:57:59 AM By: Fredirick Maudlin MD FACS [name], [name] ([id number]) AM By: Fredirick Maudlin MD FACS [phone].pdf Page 4 of 13 Signed: 08/17/2022 8:57:59 Entered By: Fredirick Maudlin on 08/17/2022 08:57:59 -------------------------------------------------------------------------------- Physical Exam Details Patient Name: Date of Service: [name], [name] 08/17/2022 8:15 A [name] Medical Record Number: [id number] Patient Account Number: [id number] Date of Birth/Sex: Treating RN: [name], [name] (62 y.o. F) Primary Care Provider: [name] Other Clinician: Referring Provider: Treating Provider/Extender: Louann Sjogren in Treatment: 4 Constitutional . . . . No acute distress. Respiratory Normal work of breathing on room air. Notes 08/17/2022: The wound looks better this week. There is still an area of necrotic muscle and tendon in the midfoot, but the forefoot has improved with some epithelium beginning to come in from the margins. Unfortunately, there is evidence of ongoing pressure-induced tissue injury near the heel. Electronic Signature(s) Signed: 08/17/2022 8:59:15 AM By: Fredirick Maudlin MD FACS Entered By: Fredirick Maudlin on 08/17/2022 08:59:15 -------------------------------------------------------------------------------- Physician Orders Details Patient Name: Date of Service: Carpino, [name] 08/17/2022 8:15 A [name] Medical Record Number: [id number] Patient Account Number: [id number] Date of Birth/Sex: Treating RN: [name][birth year] (61 y.o. 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 - Dr. Celine Ahr - room 2 Return A Anesthetic (In clinic) Topical Lidocaine 5% applied to wound bed - In Clinic Edema Control - Lymphedema / SCD / Other Elevate legs to the  level of the heart or above for 30 minutes daily and/or when sitting, a frequency of: Chuong, Austin [name] ([id number]) [id number] Page 5 of 13 Off-Loading Other: - Try and elevate Left leg throughout the day Wound Treatment Wound #19 - Foot Wound Laterality: Plantar, Left Cleanser: Soap and Water 1 x Per Day/30 Days Discharge Instructions: May shower and wash wound with dial antibacterial soap and water prior to dressing change. Cleanser: Wound Cleanser (Generic) 1 x Per Day/30 Days Discharge Instructions: Cleanse the wound with wound cleanser prior to applying a clean dressing using gauze sponges, not tissue or cotton balls. Prim Dressing: Sorbalgon AG Dressing 6x6 (in/in) 1 x Per Day/30 Days ary Discharge Instructions: Apply to wound bed as instructed Secondary Dressing: Woven Gauze Sponge, Non-Sterile 4x4 in 1 x Per Day/30 Days Discharge Instructions: Apply over primary dressing as directed. Secondary Dressing: Zetuvit Plus 4x8 in 1 x Per Day/30 Days Discharge Instructions: Apply over primary dressing as directed. Secured With: Elastic Bandage 4 inch (ACE bandage) (Generic) 1 x Per Day/30 Days Discharge Instructions: Secure with ACE bandage as  directed. Secured With: [employer], 4.5x3.1 (in/yd) (Generic) 1 x Per Day/30 Days Discharge Instructions: Secure with Kerlix as directed. Secured With: 43M Medipore Public affairs consultant Surgical T 2x10 (in/yd) (Generic) 1 x Per Day/30 Days ape Discharge Instructions: Secure with tape as directed. Electronic Signature(s) Signed: 08/17/2022 10:43:45 AM By: Fredirick Maudlin MD FACS Entered By: Fredirick Maudlin on 08/17/2022 08:59:28 -------------------------------------------------------------------------------- Problem List Details Patient Name: Date of Service: [name], [name] 08/17/2022 8:15 A [name] Medical Record Number: [id number] Patient Account Number: [id number] Date of Birth/Sex: Treating RN: [birth year][name] (61 y.o. 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 07/19/2022 No Yes M86.9 Osteomyelitis, unspecified 07/19/2022 No Yes E11.621 Type 2 diabetes mellitus with foot ulcer 07/19/2022 No Yes E66.01 Morbid (severe) obesity due to excess calories 07/19/2022 No Yes M35.00 Sjogren syndrome, unspecified 07/19/2022 No Yes [name], [name] ([id number]) [id number] Page 6 of 13 I73.9 Peripheral vascular disease, unspecified 07/19/2022 No Yes M54.65 Chronic diastolic (congestive) heart failure 07/19/2022 No Yes 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 08/17/2022 8:15 A [name] Medical Record Number: [id number] Patient Account Number: [id number] Date of Birth/Sex: Treating RN: [birth year]-[name] (61 y.o. F) Primary Care Provider: [name] Other Clinician: Referring Provider: Treating Provider/Extender: Louann Sjogren in Treatment: 4 Subjective Chief Complaint Information obtained from Patient 10/17/2021; multiple scattered wounds to the abdomen, posterior left leg, left labia and sacrum 07/19/2022: left foot DFU History of Present Illness (HPI) 07/23/2019 on evaluation today patient presents with a myriad of wounds noted at multiple locations over her right heel, right lower extremity, and abdominal region. She also has bilateral lower extremity lymphedema which is quite significant as well. Incidentally she also has congestive heart failure and hypertension. She also is obese. With that being said I think all this is contributing as well to her lower extremity edema which is  very much uncontrolled. It seems like its been at least several months since she is worn any compression according to what she tells me. With that being said I am not sure exactly when that would have been. She does have fibrotic changes in the lower extremity secondary to lymphedema worse on the left than the right. She did show me pictures of wound she had on the anterior portion of her shin which was quite significant fortunately that has healed. These issues have been intermittent at this time. Again I think that with appropriate compression therapy she may actually be doing better than what we are seeing at this point but again I am not really sure that she is ever been extremely compliant with that. Fortunately there is no signs of active infection at this time. No fever chills noted.  As far as the abdominal ulcer she initially had one wound that she thinks may have been a bug bite. Subsequently she was put a dressing on this and she states that the tape pulled skin off on other locations causing other wounds that have not healed that is been about 3 months. The wounds on her lower extremities have been intermittent over 3 years. This includes the heel. 11/19; this is a patient that I have not seen previously. She was admitted to our clinic last week with multiple wounds including several superficial circular areas on her abdomen predominantly right upper quadrant. Also 1 in her umbilicus. She has an area on her right lateral malleolus which was new today. She has bilateral lower extremity edema with very significant stasis dermatitis on the left anterior tibial area. She has tightly adherent skin in her lower extremities probably secondary to cutaneous fibrosis in the area. We put her in compression last week. She comes in with the dressings reasonably saturated. 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 She has multiple areas on her arms from scratching. 12/3; the patient I saw for the first time 2 weeks ago. She has 3 small circular areas on her abdomen 2 in the right upper quadrant one on her umbilicus. We have been using silver alginate to this area. She also had an area on her right lateral malleolus and right heel and right medial malleolus. We have been using silver alginate here under compression. She does not have an open area on the left leg today. We are going to order her stockings for the left leg. She clearly has chronic lymphedema in these areas. X-ray of the foot from 10/20 did not show any fracture or dislocation. The heel wound was noted there was no evidence of  osteomyelitis She complains of generalized pruritus. 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 She has longstanding lymphedema. Although she does not have known liver disease I looked at his CT scan of the abdomen from February of this year that showed hepatic steatosis [name], [name] ([id number]) [id number] Page 7 of 13 12/11; patient still has no open area on the left leg we transitioned her into a stocking she had. Her stockings from [place] are supposed to be arriving later today which will be the stockings of choice. The only wound remaining on the right is the right heel 2 small superficial open areas. Everything else is well on its way to healing in the right leg  few small excoriations. She has 1 major area remaining on her abdomen the rest seem to be healing 12/22 patient still has no open area on the left leg and she is still in a stocking. She had still 2 open areas on the tip of her right heel. These look like pressure related areas although the patient is not really certain how this is happening. She has an open heeled shoe that we provided. Still has 1 open area on the right lateral abdomen The patient has complaints of generalized pruritus. She has multiple excoriated areas on her abdomen that of close over her arms or thighs which I think are from scratching. She tells me that she has never had a basic work-up for this which might include basic lab work, liver functions, kidney functions, thyroid etc. She might also benefit from a dermatologist 12/29; the patient has a deeper larger more painful wound on the tip of her right heel. Again these look like pressure ulcers although she wears an open toed heel pads or heel at night to prevent it from hitting the mattress etc. They tell me and remind me that this is been present on and off for about 2 years that has closed over but then will reopen. I did look over her lab  work that was available in [place]. She does not have elevated liver function tests or creatinine. I was not able to see a TSH. This was in response to her complaints of generalized itching and a itch/scratch cycle. I also noted that she is on OxyContin and oxycodone and of course narcotics can cause generalized pruritus as a side effect Readmission: History From [place], [place] Last Week: 03/29/2021 this is a patient who presents for initial evaluation here in the clinic though I have seen her 1 time previously in 2020 this was in [place]. That was actually her initial visit therefore a heel ulceration. Subsequently that did not healing she actually saw me the 1 time in November and then subsequently saw Dr. Dellia Nims through the end of December where she apparently was healed. 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 This is on the right. With that being said right now she has a basically region on the lateral portion of the ray site where she is applying more pressure especially as her foot seems to be starting to turn in and this has become an increasingly significant issue for her to be honest. She does see Dr. Sharol Given and Dr. Sharol Given has had her on doxycycline for quite a bit of time here. Subsequently she is just now wrapping that out. I think that she probably would be benefited by continue the doxycycline for a time while we can work through what we need to do with things here. She does have evidence of osteomyelitis based on what I saw on the MRI today. This obviously is unfortunate and definitely not something that she was hoping to hear. With that being said I do believe that she would potentially be a strong candidate for hyperbaric oxygen therapy. Also believe she could be a candidate for total contact cast though we have to be cautious due to the fact that how her foot is  bending inward I think that this is good to be a little bit of a concern. We will definitely have to keeping a close eye on things. She  does have a history of diabetes mellitus type 2. She also other than the amputation has a medical history positive for hypertension. She has never undergone hyperbaric oxygen therapy previously I did show her the chamber we did talk a little bit about it today as well. Admission [facility]: o 04/06/2021 Patient presents here in [place] today for evaluation. Again she is establishing care here after I saw her last week in [place]. Obviously I think that she is doing extremely well at this point which is great news and in general I am extremely pleased with where things stand from the standpoint of the appearance of the wound. Obviously this is not significantly smaller but does look a lot cleaner I think using the Hydrofera Blue has been of benefit over the past week. Nonetheless she still has a wound with issues with pressure here which I think is good to be an ongoing issue. Also think that the osteomyelitis which is chronic is also getting ongoing issue. She does want to try to do what she can to prevent this from worsening and ending with a below-knee amputation. T that end I do think that getting her into the hyperbaric oxygen chamber would be what we need to do. She has recently been seen by cardiology o and subsequently well as far as that is concerned. Her ejection fraction was appropriate for hyperbarics and everything seems to be doing great in that regard. She has not had a recent chest x-ray she is a former smoker around 15 years ago she quit. Nonetheless I do believe with her asthma we do want to do a chest x-ray just to make sure everything is okay before proceeding with the hyperbarics although we can go ahead and see about getting the approval. I also think she is going require some sharp debridement and around this wound we also have to  contact her insurance for prior approval on this as well. 04/13/2021 upon evaluation today patient appears to be doing a little bit better in regard to her leg in fact the swelling is dramatically better. Very pleased with where things stand in that regard. Fortunately there does not appear to be any signs of active infection at this time. No fevers, chills, nausea, vomiting, or diarrhea. 04/20/2021 upon evaluation today patient appears to be doing decently well in regard to her foot. There is some need for sharp debridement here today. With that being said we will get a go ahead and proceed with that today as the foot is becoming somewhat macerated with the overhanging callus that is definitely not we want to see. With that being said the patient does have an issue here as well with a boil in the perineal region unfortunately that is an issue for her today as well. I did have a look at that as well. We are still working on dealing with insurance as far as getting hyperbarics approved. 04/27/2021 upon evaluation today patient appears to be doing somewhat poorly in general compared to where she has been. At this point she is having a lot of swelling which is the main concerning thing that I am seeing. There does not appear to be any signs of infection currently which is good news but at the same time I do feel like that she is a lot more swollen than she was even last week and is showing how much she is weeping in regard to the right lower leg. She also has a blister on the left  breast although is not an open wound at this time. She continues to have the area in the perineum as well. 05/04/2021 upon evaluation today patient appears to actually be doing decently well in regard to her wound on the foot. Fortunately there is no signs of active infection at this time. No fevers, chills, nausea, vomiting, or diarrhea. Unfortunately she does have an area on the left breast which is actually appearing to be a burn  based on what I see physically. She does note that she uses rice bags for areas in general and may have left one in that area not realizing it was burning her as she does not really have much feeling. I think this is very probable based on what I am seeing. For that reason working to treat this as such I do think we need to loosen up a lot of the necrotic tissue here. I Am going tosend in a prescription for Santyl for the patient. 9/1; patient presents for HBO today but cannot do treatment due to wheezing and feeling short of breath when laying down. She was set up as a doctor visit today since she was not able to do HBO. She states she feels okay overall and started having a dry cough over the past couple days. She has not tested herself for COVID. She denies fever/chills or sputum production. She thinks her symptoms are related to allergies. She has been using silver alginate to her right plantar foot wound. She has not been using Santyl to the breast wound because she reports forgetting to do this. She uses zinc oxide to her perennial wound. She currently denies systemic signs of infection. 9/8; patient presents for follow-up. She has been a unable to do HBO treatments for the past week due to her back pain. She is currently taking Flexeril to address this issue. She reports no issues with her compression wrap. She has been putting Santyl on the left breast wound and Monistat cream to the perennial wound. She denies signs of infection. 9/15; patient presents for follow-up. Again she is unable to do HBO treatment today due to sinus pain. She has 2 new wounds to her right foot which she is unaware of. She has been putting Santyl on the left breast wound and zinc oxide to the perineal wound. She currently denies signs of infection. Readmission 10/17/2021 Ms. Alondra Vandeven is a 61 year old female with a past medical history of type 2 diabetes, osteomyelitis status post right BKA and morbid obesity that  presents to the clinic for multiple scattered open wounds to her body. These are located to the abdomen, left posterior leg and sacral region. She has been using mupirocin ointment, Neosporin and zinc oxide to the wound beds. She has been started on Bactrim and Keflex by her primary care physician and states she has several days left to complete the course. She currently denies systemic signs of infection. READMISSION This is a 61 year old morbidly obese type II diabetic (poorly controlled with last hemoglobin A1c 9.2%). She has congestive heart failure, peripheral vascular disease, hypertension, coronary artery disease, venous stasis, connective tissue disease (o Sjogren's syndrome), and bilateral lower extremity edema. She has been seen here for various reasons in the past. She has a right above-knee amputation. She was recently hospitalized for a left diabetic foot ulcer. Orthopedic surgery was consulted. An MRI was performed that was not conclusive for osteomyelitis, but given the extent of the necrosis, orthopedics Hearns, Johanny [name] ([id number]) [id number] Page 8 of 13  recommended amputation. Infectious disease was also consulted and have recommended a 6-week course of oral doxycycline and Augmentin. Apparently wound care was also consulted while she was in the hospital and simply recommended painting the area with Betadine. She saw infectious disease on October 27 with plans to continue doxycycline on Augmentin to continue therapy until November 14. The infectious disease provider also recommended that the patient reconsider amputation. Her PCP referred her to the wound care center, as the patient is very reluctant to undergo amputation. She also has a small ulcer on her anterior tibial surface that recently opened after a minor trauma. On exam, her left foot is rolled such that the lateral aspect of her foot is the contact surface. There is a large ulcer with exposed  necrotic muscle and fat. The wound does not probe to bone, but apparently there was bone exposure in the past while she was in the hospital. No malodor or purulent drainage. The wound on her anterior tibial surface is small with just a little slough accumulation. She has modest edema and skin changes consistent with stasis dermatitis. 07/28/2022: The anterior tibial wound is healed. The left plantar foot wound looks a little bit better today. There is significantly less necrotic tissue present. There is an area at the most caudal aspect of the wound that looks like there is ongoing pressure-induced tissue injury. Bone remains covered. 08/11/2022: The wound has deteriorated quite a bit since her last visit. Her husband reports that she has had significantly more drainage, such that he has had to change the dressing multiple times per day. During the course of the visit, she recalled that she had not been taking her Lasix for at least 4 days. There is substantial periwound maceration and further tissue breakdown. There is necrotic tissue at the midportion of the wound and tendon is now exposed underneath this necrotic muscle. 08/17/2022: The wound looks better this week. There is still an area of necrotic muscle and tendon in the midfoot, but the forefoot has improved with some epithelium beginning to come in from the margins. Unfortunately, there is evidence of ongoing pressure-induced tissue injury near the heel. The patient does admit to resting her heel on a stool frequently. She is still taking Bactrim as prescribed and her Keystone topical antibiotic compound should arrive at their home today. Patient History Information obtained from Patient. Family History Diabetes - Mother, Heart Disease - Mother,Father,Siblings, Hypertension - Mother,Father,Siblings, Lung Disease - Father, Stroke - Mother, No family history of Cancer, Hereditary Spherocytosis, Kidney Disease, Seizures, Thyroid Problems,  Tuberculosis. Social History Former smoker - quit 15 years ago, Marital Status - Married, Alcohol Use - Never, Drug Use - No History, Caffeine Use - Rarely. Medical History Eyes Patient has history of Cataracts - both eyes Denies history of Glaucoma, Optic Neuritis Ear/Nose/Mouth/Throat Denies history of Chronic sinus problems/congestion, Middle ear problems Hematologic/Lymphatic Patient has history of Lymphedema Denies history of Anemia, Hemophilia, Human Immunodeficiency Virus, Sickle Cell Disease Respiratory Patient has history of Asthma Denies history of Aspiration, Chronic Obstructive Pulmonary Disease (COPD), Pneumothorax, Sleep Apnea, Tuberculosis Cardiovascular Patient has history of Congestive Heart Failure, Coronary Artery Disease, Hypertension, Peripheral Arterial Disease, Peripheral Venous Disease Denies history of Angina, Arrhythmia, Hypotension, Myocardial Infarction, Phlebitis, Vasculitis Gastrointestinal Denies history of Hepatitis A, Hepatitis B, Hepatitis C Endocrine Patient has history of Type II Diabetes Denies history of Type I Diabetes Genitourinary Denies history of End Stage Renal Disease Immunological Denies history of Lupus Erythematosus, Raynaudoos, Scleroderma Integumentary (Skin) Denies history  of History of Burn Musculoskeletal Patient has history of Rheumatoid Arthritis Denies history of Gout, Osteoarthritis, Osteomyelitis Neurologic Patient has history of Neuropathy Denies history of Dementia, Quadriplegia, Paraplegia, Seizure Disorder Oncologic Denies history of Received Chemotherapy, Received Radiation Psychiatric Denies history of Anorexia/bulimia, Confinement Anxiety Hospitalization/Surgery History - 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 Sharol Given 01/2020. 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 - 11/22 R AKA. Medical A Surgical History Notes nd Constitutional Symptoms (General Health) hypothyroidism Cardiovascular cardiac  stents Gastrointestinal GERD Endocrine Hypothyroidism Immunological Mix connective tissue disease- autoimmune Sjogren's syndrome Musculoskeletal Fibromyalgia, Scoliosis, Cronic, Bryson [name] ([id number]) [id number] Page 9 of 13 Objective Constitutional No acute distress. Vitals Time Taken: 8:20 AM, Height: 62 in, Weight: 284 lbs, BMI: 51.9, Temperature: 97.8 F, Pulse: 84 bpm, Respiratory Rate: 18 breaths/min, Blood Pressure: 113/69 mmHg, Capillary Blood Glucose: 220 mg/dl. Respiratory Normal work of breathing on room air. General Notes: 08/17/2022: The wound looks better this week. There is still an area of necrotic muscle and tendon in the midfoot, but the forefoot has improved with some epithelium beginning to come in from the margins. Unfortunately, there is evidence of ongoing pressure-induced tissue injury near the heel. Integumentary (Hair, Skin) Wound #19 status is Open. Original cause of wound was Gradually Appeared. The date acquired was: 05/17/2022. The wound has been in treatment 4 weeks. The wound is located on the [place]. The wound measures 10.2cm length x 4.2cm width x 0.2cm depth; 33.646cm^2 area and 6.729cm^3 volume. There is tendon and Fat Layer (Subcutaneous Tissue) exposed. There is no tunneling or undermining noted. There is a large amount of serosanguineous drainage noted. The wound margin is distinct with the outline attached to the wound base. There is medium (34-66%) red, pink granulation within the wound bed. There is a medium (34-66%) amount of necrotic tissue within the wound bed including Eschar and Adherent Slough. The periwound skin appearance exhibited: Callus, Maceration, Erythema. The periwound skin appearance did not exhibit: Crepitus, Excoriation, Induration, Rash, Scarring, Dry/Scaly, Atrophie Blanche, Cyanosis, Ecchymosis, Hemosiderin Staining, Mottled, Pallor, Rubor. The surrounding wound skin color is noted with  erythema. Periwound temperature was noted as No Abnormality. 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 There was a Excisional Skin/Subcutaneous Tissue/Muscle Debridement with a total area of 33.6 sq cm performed by Fredirick Maudlin, MD. With the following instrument(s): Curette to remove Viable and Non-Viable tissue/material. Material removed includes Muscle, Tendon, and Subcutaneous Tissue after achieving pain control using Lidocaine 5% topical ointment. No specimens were taken. A time out was conducted at 08:46, prior to the start of the procedure. A Moderate amount of bleeding was controlled with Pressure. The procedure was tolerated well. Post Debridement Measurements: 10.2cm length x 4.2cm width x 0.2cm depth; 6.729cm^3 volume. Character of Wound/Ulcer Post Debridement requires further debridement. Severity of Tissue Post Debridement is: Fat layer exposed. Post procedure Diagnosis Wound #19: Same as Pre-Procedure General Notes: Scribed for Dr. Celine Ahr by Blanche East, RN. Plan Follow-up Appointments: Return Appointment in 1 week. - Dr. Celine Ahr - room 2 Anesthetic: (In clinic) Topical Lidocaine 5%  applied to wound bed - In Clinic Edema Control - Lymphedema / SCD / Other: Elevate legs to the level of the heart or above for 30 minutes daily and/or when sitting, a frequency of: Off-Loading: Other: - Try and elevate Left leg throughout the day WOUND #19: -  Foot Wound Laterality: Plantar, Left Cleanser: Soap and Water 1 x Per Day/30 Days Discharge Instructions: May shower and wash wound with dial antibacterial soap and water prior to dressing change. Cleanser: Wound Cleanser (Generic) 1 x Per Day/30 Days Discharge Instructions: Cleanse the wound with wound cleanser prior to applying a clean dressing using gauze sponges, not tissue or cotton balls. Prim Dressing: Sorbalgon AG Dressing 6x6 (in/in) 1 x Per Day/30 Days ary Discharge Instructions: Apply to wound bed as instructed Bousquet, Eileen [name] ([id number]) [id number] Page 10 of 13 Secondary Dressing: Woven Gauze Sponge, Non-Sterile 4x4 in 1 x Per Day/30 Days Discharge Instructions: Apply over primary dressing as directed. Secondary Dressing: Zetuvit Plus 4x8 in 1 x Per Day/30 Days Discharge Instructions: Apply over primary dressing as directed. Secured With: Elastic Bandage 4 inch (ACE bandage) (Generic) 1 x Per Day/30 Days Discharge Instructions: Secure with ACE bandage as directed. Secured With: [employer], 4.5x3.1 (in/yd) (Generic) 1 x Per Day/30 Days Discharge Instructions: Secure with Kerlix as directed. Secured With: 44M Medipore Public affairs consultant Surgical T 2x10 (in/yd) (Generic) 1 x Per Day/30 Days ape Discharge Instructions: Secure with tape as directed. 08/17/2022: The wound looks better this week. There is still an area of necrotic muscle and tendon in the midfoot, but the forefoot has improved with some epithelium beginning to come in from the margins. Unfortunately, there is evidence of ongoing pressure-induced tissue injury near the heel. I used a curette to debride muscle, tendon, and nonviable subcutaneous tissue from her wound. We discussed ways to have her avoid applying pressure to her heel. We will continue with silver alginate and once her [employer] arrives, she will begin using that for dressing changes. She should complete her course of oral Bactrim  as prescribed. Follow-up in 1 week. Electronic Signature(s) Signed: 08/17/2022 9:00:27 AM By: Fredirick Maudlin MD FACS Entered By: Fredirick Maudlin on 08/17/2022 09:00:27 -------------------------------------------------------------------------------- HxROS Details Patient Name: Date of Service: Klippel, [name] 08/17/2022 8:15 A [name] Medical Record Number: [id number] Patient Account Number: [id number] Date of Birth/Sex: Treating RN: [name] (61 y.o. F) Primary Care Provider: [name] Other Clinician: Referring Provider: Treating Provider/Extender: Louann Sjogren in Treatment: 4 Information Obtained From Patient Constitutional Symptoms (General Health) Medical History: Past Medical History Notes: hypothyroidism Eyes Medical History: Positive for: Cataracts - both eyes Negative for: Glaucoma; Optic Neuritis Ear/Nose/Mouth/Throat Medical History: Negative for: Chronic sinus problems/congestion; Middle ear problems Hematologic/Lymphatic Medical History: Positive for: Lymphedema Negative for: Anemia; Hemophilia; Human Immunodeficiency Virus; Sickle Cell Disease Respiratory Medical History: Positive for: Asthma Negative for: Aspiration; Chronic Obstructive Pulmonary Disease (COPD); Pneumothorax; Sleep Apnea; Tuberculosis Cardiovascular Medical History: Positive for: Congestive Heart Failure; Coronary Artery Disease; Hypertension; Peripheral Arterial Disease; Peripheral Venous Disease Bryner, Addysyn [name] ([id number]) [id number] Page 11 of 13 Negative for: Angina; Arrhythmia; Hypotension; Myocardial Infarction; Phlebitis; Vasculitis Past Medical History Notes: cardiac stents Gastrointestinal Medical History: Negative for: Hepatitis A; Hepatitis B; Hepatitis C Past Medical History Notes: GERD Endocrine Medical History: Positive for: Type II Diabetes Negative for: Type I Diabetes Past Medical History  Notes: Hypothyroidism Time with diabetes: since 2002 Treated with: Insulin, Oral agents Blood sugar tested every day: Yes Tested : 2-3 times a day Genitourinary  Medical History: Negative for: End Stage Renal Disease Immunological Medical History: Negative for: Lupus Erythematosus; Raynauds; Scleroderma 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 Integumentary (Skin) Medical History: Negative for: History of Burn Musculoskeletal Medical History: Positive for: Rheumatoid Arthritis Negative for: Gout; Osteoarthritis; Osteomyelitis Past Medical History Notes: Fibromyalgia, Scoliosis, Neurologic Medical History: Positive for: Neuropathy Negative for: Dementia; Quadriplegia; Paraplegia; Seizure Disorder Oncologic Medical History: Negative for: Received Chemotherapy; Received Radiation Psychiatric Medical History: Negative for: Anorexia/bulimia; Confinement Anxiety HBO Extended History Items Eyes: Cataracts Immunizations Pneumococcal Vaccine: Received Pneumococcal Vaccination: Yes Received Pneumococcal Vaccination On or After 60th Birthday: Yes Implantable Devices None Hospitalization / Surgery History Folker, Isabele [name] ([id number]) [id number] Page 12 of 13 Type of Hospitalization/Surgery 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 Family and Social History Cancer: No; Diabetes: Yes - Mother; Heart Disease: Yes - Mother,Father,Siblings; Hereditary Spherocytosis: No; Hypertension: Yes - Mother,Father,Siblings; Kidney Disease: No; Lung Disease: Yes - Father; Seizures: No; Stroke: Yes - Mother; Thyroid Problems: No; Tuberculosis: No; Former smoker - quit 15 years ago; Marital Status - Married; Alcohol Use: Never; Drug Use: No History; Caffeine Use: Rarely; Financial Concerns: No; Food, Clothing or Shelter Needs: No; Support System Lacking: No;  Transportation Concerns: No Electronic Signature(s) Signed: 08/17/2022 10:43:45 AM By: Fredirick Maudlin MD FACS Entered By: Fredirick Maudlin on 08/17/2022 08:58:46 -------------------------------------------------------------------------------- SuperBill Details Patient Name: Date of Service: Bucher, [name] 08/17/2022 Medical Record Number: [id number] Patient Account Number: [id number] Date of Birth/Sex: Treating RN: [birth year], [name] (61 y.o. 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 Facility Procedures : CPT4 Code: [id number] Description: 63875 - DEB MUSC/FASCIA 20 SQ CM/< ICD-10 Diagnosis Description 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 Modifier: Quantity: 1 : CPT4 Code: [id number] Description: 11046 - DEB MUSC/FASCIA EA ADDL 20 CM ICD-10 Diagnosis Description 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 Modifier: Quantity: 1 Physician Procedures : CPT4 Code Description Modifier [id number] 63016 - WC PHYS LEVEL 4 - EST PT 25 ICD-10 Diagnosis Description 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 M86.9 Osteomyelitis, unspecified E11.621 Type 2 diabetes  mellitus with foot ulcer I73.9 Peripheral vascular disease, unspecified Quantity: 1 : [id number] 11043 - WC PHYS DEBR MUSCLE/FASCIA 20 SQ CM ICD-10  Diagnosis Description 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

## 2022-08-18 NOTE — Progress Notes (Signed)
Kreeger, Adasha M ([id number]) [id number] Page 1 of 8 Visit Report for 08/17/2022 Arrival Information Details Patient Name: Date of Service: Seay, [name] 08/17/2022 8:15 A M Medical Record Number: [id number] Patient Account Number: [id number] Date of Birth/Sex: Treating RN: 10/13/1960 (61 y.o. [name] Primary Care Xaniyah Buchholz: [name] Other Clinician: Referring Candia Kingsbury: Treating Kalee [name]/Extender: Vivien Rossetti in Treatment: 4 Visit Information History Since Last Visit Added or deleted any medications: No Patient Arrived: Wheel Chair Any new allergies or adverse reactions: No Arrival Time: 08:11 Had a fall or experienced change in No Accompanied By: spouse activities of daily living that may affect Transfer Assistance: EasyPivot Patient Lift risk of falls: Patient Identification Verified: Yes Signs or symptoms of abuse/neglect since last visito No Secondary Verification Process Completed: Yes Hospitalized since last visit: No Patient Requires Transmission-Based Precautions: No Implantable device outside of the clinic excluding No Patient Has Alerts: No cellular tissue based products placed in the center since last visit: Has Dressing in Place as Prescribed: Yes Pain Present Now: No Electronic Signature(s) Signed: 08/17/2022 4:16:33 PM By: [name] RN Entered By: [name] on 08/17/2022 08:20:19 -------------------------------------------------------------------------------- Encounter Discharge Information Details Patient Name: Date of Service: Antonson, [name] 08/17/2022 8:15 A M Medical Record Number: [id number] Patient Account Number: [id number] Date of Birth/Sex: Treating RN: [birth year] (61 y.o. [name] Primary Care Hallee Mckenny: [name] Other Clinician: Referring Alexius Ellington: Treating Salihah Peckham/Extender: Vivien Rossetti in Treatment: 4 Encounter  Discharge Information Items Post Procedure Vitals Discharge Condition: Stable Temperature (F): 97.8 Ambulatory Status: Wheelchair Pulse (bpm): 84 Discharge Destination: Home Respiratory Rate (breaths/min): 18 Transportation: Private Auto Blood Pressure (mmHg): 113/69 Accompanied By: spouse Schedule Follow-up Appointment: Yes Clinical Summary of Care: Electronic Signature(s) Signed: 08/17/2022 4:16:33 PM By: [name] RN Entered By: [name] on 08/17/2022 08:50:27 Mortellaro, Kenyatta M ([id number]) [id number] Page 2 of 8 -------------------------------------------------------------------------------- Lower Extremity Assessment Details Patient Name: Date of Service: Teffeteller, [name] 08/17/2022 8:15 A M Medical Record Number: [id number] Patient Account Number: [id number] Date of Birth/Sex: Treating RN: 1961/06/07 (61 y.o. [name] Primary Care Laporsche Hoeger: [name] Other Clinician: Referring Karsyn Jamie: Treating Keimari Devol/Extender: Vivien Rossetti in Treatment: 4 Edema Assessment Assessed: [Left: No] [Right: No] Edema: [Left: Ye] [Right: s] Calf Left: Right: Point of Measurement: 30 cm From Medial Instep 45 cm Ankle Left: Right: Point of Measurement: 9 cm From Medial Instep 24 cm Vascular Assessment Pulses: Dorsalis Pedis Palpable: [Left:Yes] Electronic Signature(s) Signed: 08/17/2022 4:16:33 PM By: [name] RN Entered By: [name] on 08/17/2022 08:23:02 -------------------------------------------------------------------------------- Multi Wound Chart Details Patient Name: Date of Service: Siegfried, [name] 08/17/2022 8:15 A M Medical Record Number: [id number] Patient Account Number: [id number] Date of Birth/Sex: Treating RN: 1961/07/27 (61 y.o. F) Primary Care Adrie Picking: [name] Other Clinician: Referring Talena Neira: Treating Kendallyn Lippold/Extender: Vivien Rossetti  in Treatment: 4 Vital Signs Height(in): 62 Capillary Blood Glucose(mg/dl): 144 Weight(lbs): 315 Pulse(bpm): 84 Body Mass Index(BMI): 51.9 Blood Pressure(mmHg): 113/69 Temperature(F): 97.8 Respiratory Rate(breaths/min): 18 [19:Photos:] [N/A:N/A] Left, Plantar Foot N/A N/A Wound Location: Gradually Appeared N/A N/A Wounding Event: Diabetic Wound/Ulcer of the Lower N/A N/A Primary Etiology: Extremity Cataracts, Lymphedema, Asthma, N/A N/A Comorbid History: Congestive Heart Failure, Coronary Artery Disease, Hypertension, Peripheral Arterial Disease, Peripheral Venous Disease, Type II Diabetes, Rheumatoid Arthritis, Neuropathy 05/17/2022 N/A N/A Date Acquired: 4 N/A N/A Weeks of Treatment: Open N/A N/A Wound Status: No N/A N/A Wound Recurrence: 10.2x4.2x0.2 N/A N/A Measurements L x  W x D (cm) 33.646 N/A N/A A (cm) : rea 6.729 N/A N/A Volume (cm) : -49.80% N/A N/A % Reduction in A rea: 25.10% N/A N/A % Reduction in Volume: Grade 2 N/A N/A Classification: Large N/A N/A Exudate A mount: Serosanguineous N/A N/A Exudate Type: red, brown N/A N/A Exudate Color: Distinct, outline attached N/A N/A Wound Margin: Medium (34-66%) N/A N/A Granulation A mount: Red, Pink N/A N/A Granulation Quality: Medium (34-66%) N/A N/A Necrotic A mount: Eschar, Adherent Slough N/A N/A Necrotic Tissue: Fat Layer (Subcutaneous Tissue): Yes N/A N/A Exposed Structures: Tendon: Yes Fascia: No Muscle: No Joint: No Bone: No None N/A N/A Epithelialization: Debridement - Excisional N/A N/A Debridement: Pre-procedure Verification/Time Out 08:46 N/A N/A Taken: Lidocaine 5% topical ointment N/A N/A Pain Control: Muscle, Tendon, Subcutaneous N/A N/A Tissue Debrided: Skin/Subcutaneous Tissue/Muscle N/A N/A Level: 33.6 N/A N/A Debridement A (sq cm): rea Curette N/A N/A Instrument: Moderate N/A N/A Bleeding: Pressure N/A N/A Hemostasis A chieved: Procedure was tolerated  well N/A N/A Debridement Treatment Response: 10.2x4.2x0.2 N/A N/A Post Debridement Measurements L x W x D (cm) 6.729 N/A N/A Post Debridement Volume: (cm) Callus: Yes N/A N/A Periwound Skin Texture: Excoriation: No Induration: No Crepitus: No Rash: No Scarring: No Maceration: Yes N/A N/A Periwound Skin Moisture: Dry/Scaly: No Erythema: Yes N/A N/A Periwound Skin Color: Atrophie Blanche: No Cyanosis: No Ecchymosis: No Hemosiderin Staining: No Mottled: No Pallor: No Rubor: No No Abnormality N/A N/A Temperature: Debridement N/A N/A Procedures Performed: Treatment Notes Wound #19 (Foot) Wound Laterality: Plantar, Left Cleanser Soap and Water Discharge Instruction: May shower and wash wound with dial antibacterial soap and water prior to dressing change. Wound Cleanser Discharge Instruction: Cleanse the wound with wound cleanser prior to applying a clean dressing using gauze sponges, not tissue or cotton balls. Peri-Wound Care Topical Primary Dressing Sorbalgon AG Dressing 6x6 (in/in) Buntrock, Kyliana M ([id number]) [id number] Page 4 of 8 Discharge Instruction: Apply to wound bed as instructed Secondary Dressing Woven Gauze Sponge, Non-Sterile 4x4 in Discharge Instruction: Apply over primary dressing as directed. Zetuvit Plus 4x8 in Discharge Instruction: Apply over primary dressing as directed. Secured With Elastic Bandage 4 inch (ACE bandage) Discharge Instruction: Secure with ACE bandage as directed. Kerlix Roll Sterile, 4.5x3.1 (in/yd) Discharge Instruction: Secure with Kerlix as directed. 5M Medipore Soft Cloth Surgical T 2x10 (in/yd) ape Discharge Instruction: Secure with tape as directed. Compression Wrap Compression Stockings Add-Ons Electronic Signature(s) Signed: 08/17/2022 8:56:40 AM By: Duanne Guess MD FACS Entered By: Duanne Guess on 08/17/2022  08:56:40 -------------------------------------------------------------------------------- Multi-Disciplinary Care Plan Details Patient Name: Date of Service: Saxby, [name] 08/17/2022 8:15 A M Medical Record Number: [id number] Patient Account Number: [id number] Date of Birth/Sex: Treating RN: 1961-07-16 (61 y.o. [name] Primary Care Shedrick Sarli: [name] Other Clinician: Referring Vishruth Seoane: Treating Kyriaki Moder/Extender: Vivien Rossetti in Treatment: 4 Active Inactive Venous Leg Ulcer Nursing Diagnoses: Potential for venous Insuffiency (use before diagnosis confirmed) Goals: Patient will maintain optimal edema control Date Initiated: 07/19/2022 Target Resolution Date: 09/10/2022 Goal Status: Active Interventions: Assess peripheral edema status every visit. Treatment Activities: Therapeutic compression applied : 07/19/2022 Notes: Electronic Signature(s) Signed: 08/17/2022 4:16:33 PM By: [name] RN Entered By: [name] on 08/17/2022 08:31:34 Fotopoulos, Verda M ([id number]) [id number] Page 5 of 8 -------------------------------------------------------------------------------- Pain Assessment Details Patient Name: Date of Service: Cronk, [name] 08/17/2022 8:15 A M Medical Record Number: [id number] Patient Account Number: [id number] Date of Birth/Sex: Treating RN: December 13, 1960 (61 y.o. [name] Primary Care Sierra Spargo: Carmelia Roller,  Janyth Pupa Other Clinician: Referring Tru Rana: Treating Depaul Arizpe/Extender: Vivien Rossetti in Treatment: 4 Active Problems Location of Pain Severity and Description of Pain Patient Has Paino Yes Site Locations Rate the pain. Current Pain Level: 6 Character of Pain Describe the Pain: Aching Pain Management and Medication Current Pain Management: Electronic Signature(s) Signed: 08/17/2022 4:16:33 PM By: [name] RN Entered By: [name]  on 08/17/2022 08:21:12 -------------------------------------------------------------------------------- Patient/Caregiver Education Details Patient Name: Date of Service: Mcshan, [name] 12/7/2023andnbsp8:15 A M Medical Record Number: [id number] Patient Account Number: [id number] Date of Birth/Gender: Treating RN: 1961-01-30 (61 y.o. [name] Primary Care Physician: [name] Other Clinician: Referring Physician: Treating Physician/Extender: Vivien Rossetti in Treatment: 4 Education Assessment Education Provided To: Patient Education Topics Provided Wound/Skin Impairment: Regis, Kaanapali ([id number]) [id number] Page 6 of 8 Methods: Explain/Verbal Responses: Reinforcements needed, State content correctly Electronic Signature(s) Signed: 08/17/2022 4:16:33 PM By: [name] RN Entered By: [name] on 08/17/2022 08:32:30 -------------------------------------------------------------------------------- Wound Assessment Details Patient Name: Date of Service: Sandles, [name] 08/17/2022 8:15 A M Medical Record Number: [id number] Patient Account Number: [id number] Date of Birth/Sex: Treating RN: [birth year] (61 y.o. Roselee Nova, Jamie Primary Care Altheia Shafran: [name] Other Clinician: Referring Vicy Medico: Treating Hughie Melroy/Extender: Vivien Rossetti in Treatment: 4 Wound Status Wound Number: 19 Primary Diabetic Wound/Ulcer of the Lower Extremity Etiology: Wound Location: Left, Plantar Foot Wound Open Wounding Event: Gradually Appeared Status: Date Acquired: 05/17/2022 Comorbid Cataracts, Lymphedema, Asthma, Congestive Heart Failure, Weeks Of Treatment: 4 History: Coronary Artery Disease, Hypertension, Peripheral Arterial Disease, Clustered Wound: No Peripheral Venous Disease, Type II Diabetes, Rheumatoid Arthritis, Neuropathy Photos Wound Measurements Length: (cm)  10.2 Width: (cm) 4.2 Depth: (cm) 0.2 Area: (cm) 33.646 Volume: (cm) 6.729 % Reduction in Area: -49.8% % Reduction in Volume: 25.1% Epithelialization: None Tunneling: No Undermining: No Wound Description Classification: Grade 2 Wound Margin: Distinct, outline attached Exudate Amount: Large Exudate Type: Serosanguineous Exudate Color: red, brown Foul Odor After Cleansing: No Slough/Fibrino Yes Wound Bed Granulation Amount: Medium (34-66%) Exposed Structure Granulation Quality: Red, Pink Fascia Exposed: No Necrotic Amount: Medium (34-66%) Fat Layer (Subcutaneous Tissue) Exposed: Yes Necrotic Quality: Eschar, Adherent Slough Tendon Exposed: Yes Muscle Exposed: No Joint Exposed: No Bone Exposed: No Periwound Skin Texture Texture Color Nolting, Aarna M ([id number]) [id number] Page 7 of 8 No Abnormalities Noted: No No Abnormalities Noted: No Callus: Yes Atrophie Blanche: No Crepitus: No Cyanosis: No Excoriation: No Ecchymosis: No Induration: No Erythema: Yes Rash: No Hemosiderin Staining: No Scarring: No Mottled: No Pallor: No Moisture Rubor: No No Abnormalities Noted: No Dry / Scaly: No Temperature / Pain Maceration: Yes Temperature: No Abnormality Treatment Notes Wound #19 (Foot) Wound Laterality: Plantar, Left Cleanser Soap and Water Discharge Instruction: May shower and wash wound with dial antibacterial soap and water prior to dressing change. Wound Cleanser Discharge Instruction: Cleanse the wound with wound cleanser prior to applying a clean dressing using gauze sponges, not tissue or cotton balls. Peri-Wound Care Topical Primary Dressing Sorbalgon AG Dressing 6x6 (in/in) Discharge Instruction: Apply to wound bed as instructed Secondary Dressing Woven Gauze Sponge, Non-Sterile 4x4 in Discharge Instruction: Apply over primary dressing as directed. Zetuvit Plus 4x8 in Discharge Instruction: Apply over primary dressing as  directed. Secured With Elastic Bandage 4 inch (ACE bandage) Discharge Instruction: Secure with ACE bandage as directed. Kerlix Roll Sterile, 4.5x3.1 (in/yd) Discharge Instruction: Secure with Kerlix as directed. 22M Medipore Soft Cloth Surgical T 2x10 (in/yd) ape Discharge Instruction: Secure with tape as  directed. Compression Wrap Compression Stockings Add-Ons Electronic Signature(s) Signed: 08/17/2022 4:16:33 PM By: [name] RN Entered By: [name] on 08/17/2022 08:30:03 -------------------------------------------------------------------------------- Vitals Details Patient Name: Date of Service: Lawal, [name] 08/17/2022 8:15 A M Medical Record Number: [id number] Patient Account Number: [id number] Date of Birth/Sex: Treating RN: 10/28/1960 (61 y.o. [name] Primary Care Brent Noto: [name] Other Clinician: Referring Vernadette Stutsman: Treating Acxel Dingee/Extender: Vivien Rossetti in Treatment: 4 Vital Signs Time Taken: 08:20 Temperature (F): 97.8 Fazzini, Regana M ([id number]) [id number] Page 8 of 8 Height (in): 62 Pulse (bpm): 84 Weight (lbs): 284 Respiratory Rate (breaths/min): 18 Body Mass Index (BMI): 51.9 Blood Pressure (mmHg): 113/69 Capillary Blood Glucose (mg/dl): 350 Reference Range: 80 - 120 mg / dl Electronic Signature(s) Signed: 08/17/2022 4:16:33 PM By: [name] RN Entered By: [name] on 08/17/2022 08:20:59

## 2022-08-21 ENCOUNTER — Telehealth: Payer: Self-pay | Admitting: Orthopedic Surgery

## 2022-08-21 NOTE — Telephone Encounter (Signed)
It looks like she has been going to [facility] for her left foot. Shouldn't they provide precert for wound care?

## 2022-08-21 NOTE — Telephone Encounter (Signed)
[name] [facility] [id number].  Wants to make sure that Dr Lajoyce Corners will signe orders for patients recert for wound care on left foot

## 2022-08-21 NOTE — Telephone Encounter (Signed)
Yes, can you please call and advise this would need approval from Dr. Lady Gary.

## 2022-08-22 ENCOUNTER — Telehealth: Payer: Self-pay

## 2022-08-22 ENCOUNTER — Encounter: Payer: Commercial Managed Care - HMO | Attending: Registered Nurse | Admitting: Physical Medicine and Rehabilitation

## 2022-08-22 ENCOUNTER — Encounter: Payer: Self-pay | Admitting: Physical Medicine and Rehabilitation

## 2022-08-22 VITALS — BP 113/67 | HR 86 | Ht 62.0 in

## 2022-08-22 DIAGNOSIS — M5432 Sciatica, left side: Secondary | ICD-10-CM | POA: Diagnosis present

## 2022-08-22 DIAGNOSIS — E669 Obesity, unspecified: Secondary | ICD-10-CM | POA: Diagnosis present

## 2022-08-22 DIAGNOSIS — Z89611 Acquired absence of right leg above knee: Secondary | ICD-10-CM

## 2022-08-22 DIAGNOSIS — E1169 Type 2 diabetes mellitus with other specified complication: Secondary | ICD-10-CM

## 2022-08-22 MED ORDER — PREGABALIN 50 MG PO CAPS: 50.0000 mg | ORAL_CAPSULE | Freq: Three times a day (TID) | ORAL | 3 refills | Status: DC

## 2022-08-22 MED ORDER — OXYCODONE HCL 20 MG PO TABS: 1.0000 | ORAL_TABLET | Freq: Three times a day (TID) | ORAL | 0 refills | Status: DC | PRN

## 2022-08-22 MED ORDER — DICLOFENAC SODIUM 2 % EX SOLN: 2.0000 | Freq: Two times a day (BID) | CUTANEOUS | 3 refills | Status: DC | PRN

## 2022-08-22 MED ORDER — GABAPENTIN 400 MG PO CAPS: 400.0000 mg | ORAL_CAPSULE | Freq: Four times a day (QID) | ORAL | 3 refills | Status: DC

## 2022-08-22 MED ORDER — DICLOFENAC SODIUM 1 % EX GEL: 2.0000 g | Freq: Four times a day (QID) | CUTANEOUS | 0 refills | Status: DC

## 2022-08-22 NOTE — Progress Notes (Addendum)
Subjective:    Patient ID: [name], female    DOB: 1961/05/27, 61 y.o.   MRN: [id number]  HPI [name] is a 61 year old woman who presents for hospital follow-up after CIR admission for right AKA.   1) Right AKA -on aspirin -follows with wound care -doing ok with her wheelchair mobility -therapy has been going very well, she feels it is going very well.  -she asks about getting prosthesis -has not been hurting much -healing well, no more drainage -still open -she had trouble getting into hyperbaric chamber  2) Residual limb pain -she has been using the tramadol and oxycodone.  -oxycodone is more effective for her.  -she takes oxycodone 20mg  every 8 hours approximately.  -she uses tylenol -bought a red light therapy  3) Confusion: -husband notes that this is worse with the tramadol.   4) Sciatic pain: -pain has returned.  -has been horrific -voltaren gel helps the most   5) Right lower extremity phantom pain: -mild  6) Joint pain: -diffuse, present in elbows, shoulders. -worse with weather -has never tried Infrared light -Uses heating pads and ice -she uses voltaren gels  Pain Inventory Average Pain 7 Pain Right Now 7 My pain is constant, sharp, burning, stabbing, tingling, and aching  In the last 24 hours, has pain interfered with the following? General activity 6 Relation with others 6 Enjoyment of life 8 What TIME of day is your pain at its worst? morning  and evening Sleep (in general) Poor  Pain is worse with: walking, bending, sitting, inactivity, some activites, and weather Pain improves with: rest, heat/ice, and medication Relief from Meds: 7      Family History  Problem Relation Age of Onset   CAD Mother    Hypertension Mother    Heart attack Mother    Stroke Mother    CAD Father    Heart attack Father    Allergic rhinitis Father    Asthma Father    Hypertension Brother    Hypertension Brother    Pancreatic cancer  Paternal Aunt    Breast cancer Paternal Aunt    Lung cancer Paternal Aunt    Allergic rhinitis Son    Asthma Son    Social History   Socioeconomic History   Marital status: Married    Spouse name: Conyer   Number of children: 1   Years of education: 11   Highest education level: GED or equivalent  Occupational History    Comment: Not currrently working ( last worked 2004)  Tobacco Use   Smoking status: Former    Packs/day: 1.00    Years: 19.00    Total pack years: 19.00    Types: Cigarettes    Start date: 101    Quit date: 02/11/2004    Years since quitting: 18.5    Passive exposure: Past   Smokeless tobacco: Never  Vaping Use   Vaping Use: Never used  Substance and Sexual Activity   Alcohol use: Yes    Comment: RARE Wine   Drug use: Not Currently    Types: Marijuana    Comment: "only in my teens"   Sexual activity: Yes    Birth control/protection: Surgical    Comment: Hysterectomy  Other Topics Concern   Not on file  Social History Narrative   Not on file   Social Determinants of Health   Financial Resource Strain: Low Risk  (12/27/2021)   Overall Financial Resource Strain (CARDIA)  Difficulty of Paying Living Expenses: Not hard at all  Food Insecurity: No Food Insecurity (06/14/2022)   Hunger Vital Sign    Worried About Running Out of Food in the Last Year: Never true    Ran Out of Food in the Last Year: Never true  Transportation Needs: No Transportation Needs (06/14/2022)   PRAPARE - Administrator, Civil Service (Medical): No    Lack of Transportation (Non-Medical): No  Physical Activity: Inactive (08/25/2021)   Exercise Vital Sign    Days of Exercise per Week: 0 days    Minutes of Exercise per Session: 0 min  Stress: Stress Concern Present (08/25/2021)   [employer] of Occupational Health - Occupational Stress Questionnaire    Feeling of Stress : Very much  Social Connections: Moderately Integrated (08/25/2021)   Social  Connection and Isolation Panel [NHANES]    Frequency of Communication with Friends and Family: More than three times a week    Frequency of Social Gatherings with Friends and Family: More than three times a week    Attends Religious Services: More than 4 times per year    Active Member of [employer] or Organizations: No    Attends Banker Meetings: Never    Marital Status: Married   Past Surgical History:  Procedure Laterality Date   ABDOMINAL HYSTERECTOMY  06/2005   "w/right ovariy"   AMPUTATION Right 01/28/2020    RIGHT FOURTH AND FIFTH RAY AMPUTATION   AMPUTATION Right 01/28/2020   Procedure: RIGHT FOURTH AND FIFTH RAY AMPUTATION,;  Surgeon: [name], MD;  Location: [facility] OR;  Service: Orthopedics;  Laterality: Right;   AMPUTATION Right 06/01/2021   Procedure: RIGHT BELOW KNEE AMPUTATION;  Surgeon: [name], MD;  Location: [facility] OR;  Service: Orthopedics;  Laterality: Right;   AMPUTATION Right 07/22/2021   Procedure: RIGHT ABOVE KNEE AMPUTATION;  Surgeon: [name], MD;  Location: [facility] OR;  Service: Orthopedics;  Laterality: Right;   APPENDECTOMY     BREAST BIOPSY Bilateral    9 total (04/18/2018)   CORONARY ANGIOPLASTY WITH STENT PLACEMENT  10/01/2015   normal LM, 30% LAD, 85% mid RCA and 95% distal RCA s/p PCI of the mid to distal RCA and now on DAPT with ASA and Ticagrelor.     CORONARY STENT INTERVENTION Right 04/18/2018   Procedure: CORONARY STENT INTERVENTION;  Surgeon: [name], MD;  Location: [facility];  Service: Cardiovascular;  Laterality: Right;   DILATION AND CURETTAGE OF UTERUS     FRACTURE SURGERY     LAPAROSCOPIC CHOLECYSTECTOMY     LEFT HEART CATH AND CORONARY ANGIOGRAPHY N/A 04/18/2018   Procedure: LEFT HEART CATH AND CORONARY ANGIOGRAPHY;  Surgeon: [name], MD;  Location: [facility];  Service: Cardiovascular;  Laterality: N/A;   MUSCLE BIOPSY Left    "leg"   OOPHORECTOMY  07/2010   RADIOLOGY WITH  ANESTHESIA N/A 05/25/2016   Procedure: RADIOLOGY WITH ANESTHESIA;  Surgeon: Medication Radiologist, MD;  Location: [facility] OR;  Service: Radiology;  Laterality: N/A;   STUMP REVISION Right 06/29/2021   Procedure: REVISION RIGHT BELOW KNEE AMPUTATION;  Surgeon: [name], MD;  Location: [facility] OR;  Service: Orthopedics;  Laterality: Right;   TEE WITHOUT CARDIOVERSION N/A 06/01/2021   Procedure: TRANSESOPHAGEAL ECHOCARDIOGRAM (TEE);  Surgeon: Sande Rives, MD;  Location: [facility] OR;  Service: Cardiovascular;  Laterality: N/A;   TONSILLECTOMY AND ADENOIDECTOMY     WRIST FRACTURE SURGERY Left    "  crushed it"   Past Medical History:  Diagnosis Date   Aortic stenosis    mild AS by echo 02/2021   Asthma    "on daily RX and rescue inhaler" (04/18/2018)   Chronic diastolic CHF (congestive heart failure) (HCC) 09/2015   Chronic lower back pain    Chronic neck pain    Chronic pain syndrome    Fentanyl Patch   Colon polyps    Coronary artery disease    cath with normal LM, 30% LAD, 85% mid RCA and 95% distal RCA s/p PCI of the mid to distal RCA and now on DAPT with ASA and Ticagrelor.     Eczema    Excessive daytime sleepiness 11/26/2015   Fibromyalgia    Gallstones    GERD (gastroesophageal reflux disease)    Heart murmur    "noted for the 1st time on 04/18/2018"   History of blood transfusion 07/2010   "S/P oophorectomy"   History of gout    History of hiatal hernia 1980s   "gone now" (04/18/2018)   Hyperlipidemia    Hypertension    takes Metoprolol and Enalapril daily   Hypothyroidism    takes Synthroid daily   IBS (irritable bowel syndrome)    Migraine    "nothing in the 2000s" (04/18/2018)   Mixed connective tissue disease (HCC)    NAFLD (nonalcoholic fatty liver disease)    Pneumonia    "several times" (04/18/2018)   PVC's (premature ventricular contractions)    noted on event monitor 02/2021   Rheumatoid arthritis (HCC)    "hands, elbows, shoulders, probably knees" (04/18/2018)    Scoliosis    Sjogren's syndrome (HCC)    Sleep apnea    mild - does not use cpap   Spondylosis    Type II diabetes mellitus (HCC)    takes Metformin and Hum R daily (04/18/2018)   Walker as ambulation aid    also uses wheelchair   BP 113/67   Pulse 86   Ht  (1.575 m)   SpO2 95%   BMI 51.94 kg/m   Opioid Risk Score:   Fall Risk Score:  `1  Depression screen [facility] 2/9     08/22/2022   11:21 AM 07/07/2022    9:16 AM 06/27/2022   11:39 AM 05/02/2022   11:56 AM 03/30/2022   11:08 AM 02/27/2022    1:37 PM 01/31/2022   10:18 AM  Depression screen PHQ 2/9  Decreased Interest 1 0 0  Down, Depressed, Hopeless 0  PHQ - 2 Score 0    Review of Systems  Constitutional: Negative.   HENT: Negative.    Eyes: Negative.   Respiratory:  Positive for shortness of breath and wheezing.   Endocrine: Negative.   Genitourinary: Negative.  Negative for decreased urine volume.  Musculoskeletal:  Positive for back pain and gait problem.       TINGLING, SPASMS  Skin:  Positive for wound.  Allergic/Immunologic: Negative.   Neurological:  Positive for dizziness and tremors.  Hematological: Negative.   Psychiatric/Behavioral:  Positive for sleep disturbance.   All other systems reviewed and are negative.      Objective:   Physical Exam Gen: no distress, normal appearing, weight 291 lbs, 149/79 Gen: no distress, normal appearing HEENT: oral mucosa pink and moist, NCAT Cardio: Reg rate Chest: normal effort, normal rate of breathing Abd: soft, non-distended Ext: no edema Psych: pleasant,  normal affect Skin: intact Neuro: Alert and oriented x3 Musculoskeletal: Right AKA with shrinker    Assessment & Plan:  1) Diffuse joint pain: -Discussed current symptoms of pain and history of pain.  -prescribed Pennsaid -refilled oxycodone 20mg  TID PRN for severe pain -Discussed benefits of exercise in reducing pain. -Discussed following foods that may reduce  pain: 1) Ginger (especially studied for arthritis)- reduce leukotriene production to decrease inflammation 2) Blueberries- high in phytonutrients that decrease inflammation 3) Salmon- marine omega-3s reduce joint swelling and pain 4) Pumpkin seeds- reduce inflammation 5) dark chocolate- reduces inflammation 6) turmeric- reduces inflammation 7) tart cherries - reduce pain and stiffness 8) extra virgin olive oil - its compound olecanthal helps to block prostaglandins  9) chili peppers- can be eaten or applied topically via capsaicin 10) mint- helpful for headache, muscle aches, joint pain, and itching 11) garlic- reduces inflammation  Link to further information on diet for chronic pain:    2) Diabetes: -check CBGs daily, log, and bring log to follow-up appointment -avoid sugar, bread, pasta, rice -encouraged Dexcom  -avoid snacking -perform daily foot exam and at least annual eye exam -check CBGs daily, log, and bring log to follow-up appointment -avoid sugar, bread, pasta, rice -avoid snacking -perform daily foot exam and at least annual eye exam -try to incorporate into your diet some of the following foods which are good for diabetes: 1) cinnamon- imitates effects of insulin, increasing glucose transport into cells (http://www.bray.com/ or [place] cinnamon is best, least processed) 2) nuts- can slow down the blood sugar response of carbohydrate rich foods 3) oatmeal- contains and anti-inflammatory compound avenanthramide 4) whole-milk yogurt (best types are no sugar, [place] yogurt, or goat/sheep yogurt) 5) beans- high in protein, fiber, and vitamins, low glycemic index 6) broccoli- great source of vitamin A and C 7) quinoa- higher in protein and fiber than other grains 8) spinach- high in vitamin A, fiber, and protein 9) olive oil- reduces glucose levels, LDL, and triglycerides 10) salmon- excellent  amount of omega-3-fatty acids 11) walnuts- rich in antioxidants 12) apples- high in fiber and quercetin 13) carrots- highly nutritious with low impact on blood sugar 14) eggs- improve HDL (good cholesterol), high in protein, keep you satiated 15) turmeric: improves blood sugars, cardiovascular disease, and protects kidney health 16) garlic: improves blood sugar, blood pressure, pain 17) tomatoes: highly nutritious with low impact on blood sugar   3) Sciatic pain: -prescribed lidocaine patch -continue lyrica to 50 mg BID -discussed steroid injections  4) Impaired mobility and ADLs Patient was seen for mobility assessment, referring patient to PT eval for power wheelchair

## 2022-08-22 NOTE — Patient Instructions (Addendum)
Foods that may reduce pain: 1) Ginger (especially studied for arthritis)- reduce leukotriene production to decrease inflammation 2) Blueberries- high in phytonutrients that decrease inflammation 3) Salmon- marine omega-3s reduce joint swelling and pain 4) Pumpkin seeds- reduce inflammation 5) dark chocolate- reduces inflammation 6) turmeric- reduces inflammation 7) tart cherries - reduce pain and stiffness 8) extra virgin olive oil - its compound olecanthal helps to block prostaglandins  9) chili peppers- can be eaten or applied topically via capsaicin 10) mint- helpful for headache, muscle aches, joint pain, and itching 11) garlic- reduces inflammation  Link to further information on diet for chronic pain: [email or web address]/[email or web address]-[email or web address]-pain    Diabetes: -check CBGs daily, log, and bring log to follow-up appointment -avoid sugar, bread, pasta, rice -avoid snacking -perform daily foot exam and at least annual eye exam -try to incorporate into your diet some of the following foods which are good for diabetes: 1) cinnamon- imitates effects of insulin, increasing glucose transport into cells ([place] or [place] cinnamon is best, least processed) 2) nuts- can slow down the blood sugar response of carbohydrate rich foods 3) oatmeal- contains and anti-inflammatory compound avenanthramide 4) whole-milk yogurt (best types are no sugar, Greek yogurt, or goat/sheep yogurt) 5) beans- high in protein, fiber, and vitamins, low glycemic index 6) broccoli- great source of vitamin A and C 7) quinoa- higher in protein and fiber than other grains 8) spinach- high in vitamin A, fiber, and protein 9) olive oil- reduces glucose levels, LDL, and triglycerides 10) salmon- excellent amount of omega-3-fatty acids 11) walnuts- rich in antioxidants 12) apples- high in fiber and quercetin 13) carrots- highly nutritious with low impact on blood  sugar 14) eggs- improve HDL (good cholesterol), high in protein, keep you satiated 15) turmeric: improves blood sugars, cardiovascular disease, and protects kidney health 16) garlic: improves blood sugar, blood pressure, pain 17) tomatoes: highly nutritious with low impact on blood sugar  

## 2022-08-22 NOTE — Addendum Note (Signed)
Addended by: Horton Chin on: 08/22/2022 12:00 PM   Modules accepted: Orders

## 2022-08-22 NOTE — Telephone Encounter (Signed)
LMOM for Heather of the below message. Maybe to contact Dr. Lady Gary to sign orders

## 2022-08-22 NOTE — Telephone Encounter (Signed)
Pharmacy sent a fax stating Pennsaid 2% is over $2,600, insurance will not cover it.

## 2022-08-22 NOTE — Addendum Note (Signed)
Addended by: Horton Chin on: 08/22/2022 03:32 PM   Modules accepted: Orders

## 2022-08-24 ENCOUNTER — Encounter (HOSPITAL_BASED_OUTPATIENT_CLINIC_OR_DEPARTMENT_OTHER): Payer: Commercial Managed Care - HMO | Admitting: Internal Medicine

## 2022-08-24 DIAGNOSIS — E11621 Type 2 diabetes mellitus with foot ulcer: Secondary | ICD-10-CM | POA: Diagnosis not present

## 2022-08-24 NOTE — Progress Notes (Signed)
[name], [name] M ([id number]) [id number] Page 1 of 8 Visit Report for 08/24/2022 Arrival Information Details Patient Name: Date of Service: [name], [name] 08/24/2022 9:30 A M Medical Record Number: [id number] Patient Account Number: [id number] Date of Birth/Sex: Treating RN: [birth year] (61 y.o. F) Primary Care Lynsee Wands: [name] Other Clinician: Referring Jakita Dutkiewicz: Treating Madyson Lukach/Extender: Lynnell Catalan in Treatment: 5 Visit Information History Since Last Visit All ordered tests and consults were completed: No Patient Arrived: Wheel Chair Added or deleted any medications: No Arrival Time: 10:15 Any new allergies or adverse reactions: No Accompanied By: husband Had a fall or experienced change in No Transfer Assistance: None activities of daily living that may affect Patient Identification Verified: Yes risk of falls: Secondary Verification Process Completed: Yes Signs or symptoms of abuse/neglect since last visito No Patient Requires Transmission-Based Precautions: No Hospitalized since last visit: No Patient Has Alerts: No Implantable device outside of the clinic excluding No cellular tissue based products placed in the center since last visit: Pain Present Now: No Electronic Signature(s) Signed: 08/24/2022 11:12:47 AM By: Dayton Scrape Entered By: Dayton Scrape on 08/24/2022 10:16:31 -------------------------------------------------------------------------------- Encounter Discharge Information Details Patient Name: Date of Service: [name], [name] 08/24/2022 9:30 A M Medical Record Number: [id number] Patient Account Number: [id number] Date of Birth/Sex: Treating RN: Sep 16, 1960 (61 y.o. [name] Primary Care Lynniah Janoski: [name] Other Clinician: Referring Loic Hobin: Treating Laruth Hanger/Extender: Lynnell Catalan in Treatment: 5 Encounter Discharge Information Items  Post Procedure Vitals Discharge Condition: Stable Temperature (F): 98.1 Ambulatory Status: Wheelchair Pulse (bpm): 96 Discharge Destination: Home Respiratory Rate (breaths/min): 22 Transportation: Private Auto Blood Pressure (mmHg): 151/69 Accompanied By: spouse Schedule Follow-up Appointment: Yes Clinical Summary of Care: Electronic Signature(s) Signed: 08/24/2022 4:51:25 PM By: [name] RN Entered By: [name] on 08/24/2022 11:00:53 Henne, Miachel Roux ([id number]) [id number] Page 2 of 8 -------------------------------------------------------------------------------- Lower Extremity Assessment Details Patient Name: Date of Service: [name], [name] 08/24/2022 9:30 A M Medical Record Number: [id number] Patient Account Number: [id number] Date of Birth/Sex: Treating RN: Feb 14, 1961 (61 y.o. [name] Primary Care Semaja Lymon: [name] Other Clinician: Referring Jerrie Gullo: Treating Lasharn Bufkin/Extender: Lynnell Catalan in Treatment: 5 Edema Assessment Assessed: Kyra Searles: No] [Right: No] Edema: [Left: Ye] [Right: s] Calf Left: Right: Point of Measurement: 30 cm From Medial Instep 45 cm Ankle Left: Right: Point of Measurement: 9 cm From Medial Instep 26 cm Vascular Assessment Pulses: Dorsalis Pedis Palpable: [Left:Yes] Electronic Signature(s) Signed: 08/24/2022 4:51:25 PM By: [name] RN Entered By: [name] on 08/24/2022 10:36:52 -------------------------------------------------------------------------------- Multi Wound Chart Details Patient Name: Date of Service: [name], [name] 08/24/2022 9:30 A M Medical Record Number: [id number] Patient Account Number: [id number] Date of Birth/Sex: Treating RN: 07-29-1961 (61 y.o. F) Primary Care [name]: [name] Other Clinician: Referring Andrue Dini: Treating Diahann Guajardo/Extender: Lynnell Catalan in Treatment: 5 Vital  Signs Height(in): 62 Capillary Blood Glucose(mg/dl): 239 Weight(lbs): 532 Pulse(bpm): 96 Body Mass Index(BMI): 51.9 Blood Pressure(mmHg): 151/69 Temperature(F): 98.1 Respiratory Rate(breaths/min): 22 [19:Photos:] [N/A:N/A] Left, Plantar Foot N/A N/A Wound Location: Gradually Appeared N/A N/A Wounding Event: Diabetic Wound/Ulcer of the Lower N/A N/A Primary Etiology: Extremity Cataracts, Lymphedema, Asthma, N/A N/A Comorbid History: Congestive Heart Failure, Coronary Artery Disease, Hypertension, Peripheral Arterial Disease, Peripheral Venous Disease, Type II Diabetes, Rheumatoid Arthritis, Neuropathy 05/17/2022 N/A N/A Date Acquired: 5 N/A N/A Weeks of Treatment: Open N/A N/A Wound Status: No N/A N/A Wound Recurrence: 10.1x4x2 N/A N/A Measurements L x W x D (cm)  31.73 N/A N/A A (cm) : rea 63.46 N/A N/A Volume (cm) : -41.30% N/A N/A % Reduction in A rea: -606.30% N/A N/A % Reduction in Volume: 4 Starting Position 1 (o'clock): 5 Ending Position 1 (o'clock): 0.3 Maximum Distance 1 (cm): Yes N/A N/A Undermining: Grade 2 N/A N/A Classification: Large N/A N/A Exudate A mount: Serosanguineous N/A N/A Exudate Type: red, brown N/A N/A Exudate Color: Distinct, outline attached N/A N/A Wound Margin: Medium (34-66%) N/A N/A Granulation A mount: Red, Pink N/A N/A Granulation Quality: Medium (34-66%) N/A N/A Necrotic A mount: Eschar N/A N/A Necrotic Tissue: Fat Layer (Subcutaneous Tissue): Yes N/A N/A Exposed Structures: Tendon: Yes Fascia: No Muscle: No Joint: No Bone: No None N/A N/A Epithelialization: Debridement - Excisional N/A N/A Debridement: Pre-procedure Verification/Time Out 10:55 N/A N/A Taken: Lidocaine 5% topical ointment N/A N/A Pain Control: Tendon, Callus N/A N/A Tissue Debrided: Skin/Subcutaneous Tissue/Muscle N/A N/A Level: 40 N/A N/A Debridement A (sq cm): rea Curette, Forceps, Scissors N/A N/A Instrument: Minimum N/A  N/A Bleeding: Pressure N/A N/A Hemostasis A chieved: Procedure was tolerated well N/A N/A Debridement Treatment Response: 10.1x4x0.1 N/A N/A Post Debridement Measurements L x W x D (cm) 3.173 N/A N/A Post Debridement Volume: (cm) Callus: Yes N/A N/A Periwound Skin Texture: Excoriation: No Induration: No Crepitus: No Rash: No Scarring: No Maceration: Yes N/A N/A Periwound Skin Moisture: Dry/Scaly: No Erythema: Yes N/A N/A Periwound Skin Color: Atrophie Blanche: No Cyanosis: No Ecchymosis: No Hemosiderin Staining: No Mottled: No Pallor: No Rubor: No No Abnormality N/A N/A Temperature: Debridement N/A N/A Procedures Performed: Treatment Notes Wound #19 (Foot) Wound Laterality: Plantar, Left Cleanser Soap and Water Discharge Instruction: May shower and wash wound with dial antibacterial soap and water prior to dressing change. Wound Cleanser Discharge Instruction: Cleanse the wound with wound cleanser prior to applying a clean dressing using gauze sponges, not tissue or cotton balls. Peri-Wound Care Bartoletti, Jalexia M ([id number]) [id number] Page 4 of 8 Topical Primary Dressing Sorbalgon AG Dressing 6x6 (in/in) Discharge Instruction: Apply to wound bed as instructed Secondary Dressing Woven Gauze Sponge, Non-Sterile 4x4 in Discharge Instruction: Apply over primary dressing as directed. Zetuvit Plus 4x8 in Discharge Instruction: Apply over primary dressing as directed. Secured With Elastic Bandage 4 inch (ACE bandage) Discharge Instruction: Secure with ACE bandage as directed. Kerlix Roll Sterile, 4.5x3.1 (in/yd) Discharge Instruction: Secure with Kerlix as directed. 61M Medipore Soft Cloth Surgical T 2x10 (in/yd) ape Discharge Instruction: Secure with tape as directed. Compression Wrap Compression Stockings Add-Ons Electronic Signature(s) Signed: 08/24/2022 4:13:03 PM By: Baltazar Najjar MD Entered By: Baltazar Najjar on 08/24/2022  11:29:35 -------------------------------------------------------------------------------- Multi-Disciplinary Care Plan Details Patient Name: Date of Service: [name], [name] 08/24/2022 9:30 A M Medical Record Number: [id number] Patient Account Number: [id number] Date of Birth/Sex: Treating RN: [birth year] (61 y.o. [name] Primary Care Keeara Frees: [name] Other Clinician: Referring Suhaan Perleberg: Treating Danila Eddie/Extender: Lynnell Catalan in Treatment: 5 Active Inactive Venous Leg Ulcer Nursing Diagnoses: Potential for venous Insuffiency (use before diagnosis confirmed) Goals: Patient will maintain optimal edema control Date Initiated: 07/19/2022 Target Resolution Date: 10/05/2022 Goal Status: Active Interventions: Assess peripheral edema status every visit. Treatment Activities: Therapeutic compression applied : 07/19/2022 Notes: Electronic Signature(s) Signed: 08/24/2022 4:51:25 PM By: [name] RN Entered By: [name] on 08/24/2022 10:59:19 Abee, Anitha M ([id number]) [id number] Page 5 of 8 -------------------------------------------------------------------------------- Pain Assessment Details Patient Name: Date of Service: [name], [name] 08/24/2022 9:30 A M Medical Record Number: [id number] Patient Account Number: [id number] Date of  Birth/Sex: Treating RN: [birth year] (61 y.o. F) Primary Care Kaitlin Ardito: [name] Other Clinician: Referring Dago Jungwirth: Treating Frederika Hukill/Extender: Lynnell Catalan in Treatment: 5 Active Problems Location of Pain Severity and Description of Pain Patient Has Paino Yes Site Locations Rate the pain. Current Pain Level: 7 Worst Pain Level: 10 Least Pain Level: 0 Tolerable Pain Level: 2 Pain Management and Medication Current Pain Management: Electronic Signature(s) Signed: 08/24/2022 11:12:47 AM By: Dayton Scrape Entered By: Dayton Scrape on 08/24/2022 10:17:26 -------------------------------------------------------------------------------- Patient/Caregiver Education Details Patient Name: Date of Service: [name], [name] 12/14/2023andnbsp9:30 A M Medical Record Number: [id number] Patient Account Number: [id number] Date of Birth/Gender: Treating RN: Jan 07, 1961 (61 y.o. [name] Primary Care Physician: [name] Other Clinician: Referring Physician: Treating Physician/Extender: Lynnell Catalan in Treatment: 5 Education Assessment Education Provided To: Patient Education Topics Provided Wound/Skin Impairment: Methods: Explain/Verbal Responses: Reinforcements needed, State content correctly Mackert, Amaya M ([id number]) [id number] Page 6 of 8 Electronic Signature(s) Signed: 08/24/2022 4:51:25 PM By: [name] RN Entered By: [name] on 08/24/2022 10:59:33 -------------------------------------------------------------------------------- Wound Assessment Details Patient Name: Date of Service: [name], [name] 08/24/2022 9:30 A M Medical Record Number: [id number] Patient Account Number: [id number] Date of Birth/Sex: Treating RN: 16-Dec-1960 (61 y.o. Roselee Nova, Jamie Primary Care Gaige Fussner: [name] Other Clinician: Referring Tanasha Menees: Treating Evoleth Nordmeyer/Extender: Lynnell Catalan in Treatment: 5 Wound Status Wound Number: 19 Primary Diabetic Wound/Ulcer of the Lower Extremity Etiology: Wound Location: Left, Plantar Foot Wound Open Wounding Event: Gradually Appeared Status: Date Acquired: 05/17/2022 Comorbid Cataracts, Lymphedema, Asthma, Congestive Heart Failure, Weeks Of Treatment: 5 History: Coronary Artery Disease, Hypertension, Peripheral Arterial Disease, Clustered Wound: No Peripheral Venous Disease, Type II Diabetes, Rheumatoid Arthritis, Neuropathy Photos Wound Measurements Length: (cm)  10.1 Width: (cm) 4 Depth: (cm) 2 Area: (cm) 31.73 Volume: (cm) 63.46 % Reduction in Area: -41.3% % Reduction in Volume: -606.3% Epithelialization: None Tunneling: No Undermining: Yes Starting Position (o'clock): 4 Ending Position (o'clock): 5 Maximum Distance: (cm) 0.3 Wound Description Classification: Grade 2 Wound Margin: Distinct, outline attached Exudate Amount: Large Exudate Type: Serosanguineous Exudate Color: red, brown Foul Odor After Cleansing: No Slough/Fibrino Yes Wound Bed Granulation Amount: Medium (34-66%) Exposed Structure Granulation Quality: Red, Pink Fascia Exposed: No Necrotic Amount: Medium (34-66%) Fat Layer (Subcutaneous Tissue) Exposed: Yes Necrotic Quality: Eschar Tendon Exposed: Yes Muscle Exposed: No Joint Exposed: No Bone Exposed: No [name], [name] M ([id number]) [id number] Page 7 of 8 Periwound Skin Texture Texture Color No Abnormalities Noted: No No Abnormalities Noted: No Callus: Yes Atrophie Blanche: No Crepitus: No Cyanosis: No Excoriation: No Ecchymosis: No Induration: No Erythema: Yes Rash: No Hemosiderin Staining: No Scarring: No Mottled: No Pallor: No Moisture Rubor: No No Abnormalities Noted: No Dry / Scaly: No Temperature / Pain Maceration: Yes Temperature: No Abnormality Treatment Notes Wound #19 (Foot) Wound Laterality: Plantar, Left Cleanser Soap and Water Discharge Instruction: May shower and wash wound with dial antibacterial soap and water prior to dressing change. Wound Cleanser Discharge Instruction: Cleanse the wound with wound cleanser prior to applying a clean dressing using gauze sponges, not tissue or cotton balls. Peri-Wound Care Topical Primary Dressing Sorbalgon AG Dressing 6x6 (in/in) Discharge Instruction: Apply to wound bed as instructed Secondary Dressing Woven Gauze Sponge, Non-Sterile 4x4 in Discharge Instruction: Apply over primary dressing as  directed. Zetuvit Plus 4x8 in Discharge Instruction: Apply over primary dressing as directed. Secured With Elastic Bandage 4 inch (ACE bandage) Discharge Instruction: Secure with ACE bandage as directed. Kerlix Roll  Sterile, 4.5x3.1 (in/yd) Discharge Instruction: Secure with Kerlix as directed. 65M Medipore Soft Cloth Surgical T 2x10 (in/yd) ape Discharge Instruction: Secure with tape as directed. Compression Wrap Compression Stockings Add-Ons Electronic Signature(s) Signed: 08/24/2022 4:51:25 PM By: [name] RN Entered By: [name] on 08/24/2022 10:38:21 -------------------------------------------------------------------------------- Vitals Details Patient Name: Date of Service: [name], [name] 08/24/2022 9:30 A M Medical Record Number: [id number] Patient Account Number: [id number] Date of Birth/Sex: Treating RN: 12/12/1960 (61 y.o. F) Primary Care Raeghan Demeter: [name] Other Clinician: Referring Annah Jasko: Treating Tomekia Helton/Extender: Lynnell Catalan in Treatment: 5 Vital Signs Remillard, Miachel Roux ([id number]) [id number] Page 8 of 8 Time Taken: 10:15 Temperature (F): 98.1 Height (in): 62 Pulse (bpm): 96 Weight (lbs): 284 Respiratory Rate (breaths/min): 22 Body Mass Index (BMI): 51.9 Blood Pressure (mmHg): 151/69 Capillary Blood Glucose (mg/dl): 086 Reference Range: 80 - 120 mg / dl Electronic Signature(s) Signed: 08/24/2022 11:12:47 AM By: Dayton Scrape Entered By: Dayton Scrape on 08/24/2022 10:17:04

## 2022-08-24 NOTE — Progress Notes (Signed)
Curlin, [name] ([id number]) [id number] Page 1 of 9 Visit Report for 08/24/2022 Debridement Details Patient Name: Date of Service: [name], [name] 08/24/2022 9:30 A [name] Medical Record Number: [id number] Patient Account Number: [id number] Date of Birth/Sex: Treating RN: [birth year] (61 y.o. F) Primary Care Provider: Riki [name] Other Clinician: Referring Provider: Treating Provider/Extender: Kathrine Haddock in Treatment: 5 Debridement Performed for Assessment: Wound #19 Left,Plantar Foot Performed By: Physician Ricard Dillon., MD Debridement Type: Debridement Severity of Tissue Pre Debridement: Fat layer exposed Level of Consciousness (Pre-procedure): Awake and Alert Pre-procedure Verification/Time Out Yes - 10:55 Taken: Start Time: 10:56 Pain Control: Lidocaine 5% topical ointment T Area Debrided (L x W): otal 2 (cm) x 2 (cm) = 4 (cm) Tissue and other material debrided: Non-Viable, Callus, Tendon, Skin: Epidermis Level: Skin/Subcutaneous Tissue/Muscle Debridement Description: Excisional Instrument: Curette, Forceps, Scissors Bleeding: Minimum Hemostasis Achieved: Pressure Response to Treatment: Procedure was tolerated well Level of Consciousness (Post- Awake and Alert procedure): Post Debridement Measurements of Total Wound Length: (cm) 10.1 Width: (cm) 4 Depth: (cm) 0.1 Volume: (cm) 3.173 Character of Wound/Ulcer Post Debridement: Requires Further Debridement Severity of Tissue Post Debridement: Fat layer exposed Post Procedure Diagnosis Same as Pre-procedure Notes Scribed for Dr. Dellia Nims by Blanche East, RN Electronic Signature(s) Signed: 08/24/2022 4:13:03 PM By: Linton Ham MD Entered By: Linton Ham on 08/24/2022 11:30:26 -------------------------------------------------------------------------------- HPI Details Patient Name: Date of Service: [name], [name] 08/24/2022 9:30 A [name] Medical  Record Number: [id number] Patient Account Number: [id number] Date of Birth/Sex: Treating RN: 10/29/1960 (61 y.o. F) Primary Care Provider: Riki [name] Other Clinician: Referring Provider: Treating Provider/Extender: Kathrine Haddock in Treatment: 5 History of Present Illness [name], [name] ([id number]) [id number] Page 2 of 9 HPI Description: 07/23/2019 on evaluation today patient presents with a myriad of wounds noted at multiple locations over her right heel, right lower extremity, and abdominal region. She also has bilateral lower extremity lymphedema which is quite significant as well. Incidentally she also has congestive heart failure and hypertension. She also is obese. With that being said I think all this is contributing as well to her lower extremity edema which is very much uncontrolled. It seems like its been at least several months since she is worn any compression according to what she tells me. With that being said I am not sure exactly when that would have been. She does have fibrotic changes in the lower extremity secondary to lymphedema worse on the left than the right. She did show me pictures of wound she had on the anterior portion of her shin which was quite significant fortunately that has healed. These issues have been intermittent at this time. Again I think that with appropriate compression therapy she may actually be doing better than what we are seeing at this point but again I am not really sure that she is ever been extremely compliant with that. Fortunately there is no signs of active infection at this time. No fever chills noted. As far as the abdominal ulcer she initially had one wound that she thinks may have been a bug bite. Subsequently she was put a dressing on this and she states that the tape pulled skin off on other locations causing other wounds that have not healed that is been about 3 months. The  wounds on her lower extremities have been intermittent over 3 years. This includes the heel. 11/19; this is a patient that I have not seen previously. She was admitted to our  clinic last week with multiple wounds including several superficial circular areas on her abdomen predominantly right upper quadrant. Also 1 in her umbilicus. She has an area on her right lateral malleolus which was new today. She has bilateral lower extremity edema with very significant stasis dermatitis on the left anterior tibial area. She has tightly adherent skin in her lower extremities probably secondary to cutaneous fibrosis in the area. We put her in compression last week. She comes in with the dressings reasonably saturated. 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 She has multiple areas on her arms from scratching. 12/3; the patient I saw for the first time 2 weeks ago. She has 3 small circular areas on her abdomen 2 in the right upper quadrant one on her umbilicus. We have been using silver alginate to this area. She also had an area on her right lateral malleolus and right heel and right medial malleolus. We have been using silver alginate here under compression. She does not have an open area on the left leg today. We are going to order her stockings for the left leg. She clearly has chronic lymphedema in these areas. X-ray of the foot from 10/20 did not show any fracture or dislocation. The heel wound was noted there was no evidence of osteomyelitis She complains of generalized pruritus. 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 She has longstanding lymphedema. Although she does not have known liver disease I looked at his CT scan of the abdomen from February of this year that  showed hepatic steatosis 12/11; patient still has no open area on the left leg we transitioned her into a stocking she had. Her stockings from [employer] are supposed to be arriving later today which will be the stockings of choice. The only wound remaining on the right is the right heel 2 small superficial open areas. Everything else is well on its way to healing in the right leg few small excoriations. She has 1 major area remaining on her abdomen the rest seem to be healing 12/22 patient still has no open area on the left leg and she is still in a stocking. She had still 2 open areas on the tip of her right heel. These look like pressure related areas although the patient is not really certain how this is happening. She has an open heeled shoe that we provided. Still has 1 open area on the right lateral abdomen The patient has complaints of generalized pruritus. She has multiple excoriated areas on her abdomen that of close over her arms or thighs which I think are from scratching. She tells me that she has never had a basic work-up for this which might include basic lab work, liver functions, kidney functions, thyroid etc. She might also benefit from a dermatologist 12/29; the patient has a deeper larger more painful wound on the tip of her right heel. Again these look like pressure ulcers although she wears an open toed heel pads or heel at night to prevent it from hitting the mattress etc. They tell me and remind me that this is been present on and off for about 2 years that has closed over but then will reopen. I did look over her lab work that was available in [place]. She does not have elevated liver function tests  or creatinine. I was not able to see a TSH. This was in response to her complaints of generalized itching and a itch/scratch cycle. I also noted that she is on OxyContin and oxycodone and of course narcotics can cause generalized pruritus as a side  effect Readmission: History From [place], [place] Last Week: 03/29/2021 this is a patient who presents for initial evaluation here in the clinic though I have seen her 1 time previously in 2020 this was in [place]. That was actually her initial visit therefore a heel ulceration. Subsequently that did not healing she actually saw me the 1 time in November and then subsequently saw Dr. Dellia Nims through the end of December where she apparently was healed. 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 This is on the right. With that being said right now she has a basically region on the lateral portion of the ray site where she is applying more pressure especially as her foot seems to be starting to turn in and this has become an increasingly significant issue for her to be honest. She does see Dr. Sharol Given and Dr. Sharol Given has had her on doxycycline for quite a bit of time here. Subsequently she is just now wrapping that out. I think that she probably would be benefited by continue the doxycycline for a time while we can work through what we need to do with things here. She does have evidence of osteomyelitis based on what I saw on the MRI today. This obviously is unfortunate and definitely not something that she was hoping to hear. With that being said I do believe that she would potentially be a strong candidate for hyperbaric oxygen therapy. Also believe she could be a candidate for total contact cast though we have to be cautious due to the fact that how her foot is bending inward I think that this is good to be a little bit of a concern. We will definitely have to keeping a close eye on things. She does have a history of diabetes mellitus type 2. She also other than the amputation has a medical history positive for hypertension. She has never undergone hyperbaric oxygen therapy previously I did show her the  chamber we did talk a little bit about it today as well. Admission [facility]: o 04/06/2021 Patient presents here in [place] today for evaluation. Again she is establishing care here after I saw her last week in [place]. Obviously I think that she is doing extremely well at this point which is great news and in general I am extremely pleased with where things stand from the standpoint of the appearance of the wound. Obviously this is not significantly smaller but does look a lot cleaner I think using the Hydrofera Blue has been of benefit over the past week. Nonetheless she still has a wound with issues with pressure here which I think is good to be an ongoing issue. Also think that the osteomyelitis which is chronic is also getting ongoing issue. She does want to try to do what she can to prevent this from worsening and ending with a below-knee amputation. T that end I do think that getting her into the hyperbaric oxygen chamber would be what we need to do. She has recently been seen by cardiology o and subsequently well as far as that is concerned. Her ejection fraction was appropriate for hyperbarics  and everything seems to be doing great in that regard. She has not had a recent chest x-ray she is a former smoker around 15 years ago she quit. Nonetheless I do believe with her asthma we do want to do a chest x-ray just to make sure everything is okay before proceeding with the hyperbarics although we can go ahead and see about getting the approval. I also think she is going require some sharp debridement and around this wound we also have to contact her insurance for prior approval on this as well. 04/13/2021 upon evaluation today patient appears to be doing a little bit better in regard to her leg in fact the swelling is dramatically better. Very pleased with where things stand in that regard. Fortunately there does not appear to be any signs of active infection at this time. No  fevers, chills, nausea, vomiting, or diarrhea. 04/20/2021 upon evaluation today patient appears to be doing decently well in regard to her foot. There is some need for sharp debridement here today. With that being said we will get a go ahead and proceed with that today as the foot is becoming somewhat macerated with the overhanging callus that is definitely not we want to see. With that being said the patient does have an issue here as well with a boil in the perineal region unfortunately that is an issue for her today as well. I did have a look at that as well. We are still working on dealing with insurance as far as getting hyperbarics approved. 04/27/2021 upon evaluation today patient appears to be doing somewhat poorly in general compared to where she has been. At this point she is having a lot of swelling which is the main concerning thing that I am seeing. There does not appear to be any signs of infection currently which is good news but at the same time I do feel like that she is a lot more swollen than she was even last week and is showing how much she is weeping in regard to the right lower leg. She also has a blister on the left breast although is not an open wound at this time. She continues to have the area in the perineum as well. 05/04/2021 upon evaluation today patient appears to actually be doing decently well in regard to her wound on the foot. Fortunately there is no signs of active infection at this time. No fevers, chills, nausea, vomiting, or diarrhea. Unfortunately she does have an area on the left breast which is actually appearing to be a burn based on what I see physically. She does note that she uses rice bags for areas in general and may have left one in that area not realizing it was [name], [name] ([id number]) [id number] Page 3 of 9 burning her as she does not really have much feeling. I think this is very probable based on what I am seeing. For  that reason working to treat this as such I do think we need to loosen up a lot of the necrotic tissue here. I Am going tosend in a prescription for Santyl for the patient. 9/1; patient presents for HBO today but cannot do treatment due to wheezing and feeling short of breath when laying down. She was set up as a doctor visit today since she was not able to do HBO. She states she feels okay overall and started having a dry cough over the past couple days. She has not tested herself for COVID.  She denies fever/chills or sputum production. She thinks her symptoms are related to allergies. She has been using silver alginate to her right plantar foot wound. She has not been using Santyl to the breast wound because she reports forgetting to do this. She uses zinc oxide to her perennial wound. She currently denies systemic signs of infection. 9/8; patient presents for follow-up. She has been a unable to do HBO treatments for the past week due to her back pain. She is currently taking Flexeril to address this issue. She reports no issues with her compression wrap. She has been putting Santyl on the left breast wound and Monistat cream to the perennial wound. She denies signs of infection. 9/15; patient presents for follow-up. Again she is unable to do HBO treatment today due to sinus pain. She has 2 new wounds to her right foot which she is unaware of. She has been putting Santyl on the left breast wound and zinc oxide to the perineal wound. She currently denies signs of infection. Readmission 10/17/2021 Ms. William Laske is a 61 year old female with a past medical history of type 2 diabetes, osteomyelitis status post right BKA and morbid obesity that presents to the clinic for multiple scattered open wounds to her body. These are located to the abdomen, left posterior leg and sacral region. She has been using mupirocin ointment, Neosporin and zinc oxide to the wound beds. She has been started on Bactrim and  Keflex by her primary care physician and states she has several days left to complete the course. She currently denies systemic signs of infection. READMISSION This is a 61 year old morbidly obese type II diabetic (poorly controlled with last hemoglobin A1c 9.2%). She has congestive heart failure, peripheral vascular disease, hypertension, coronary artery disease, venous stasis, connective tissue disease (o Sjogren's syndrome), and bilateral lower extremity edema. She has been seen here for various reasons in the past. She has a right above-knee amputation. She was recently hospitalized for a left diabetic foot ulcer. Orthopedic surgery was consulted. An MRI was performed that was not conclusive for osteomyelitis, but given the extent of the necrosis, orthopedics recommended amputation. Infectious disease was also consulted and have recommended a 6-week course of oral doxycycline and Augmentin. Apparently wound care was also consulted while she was in the hospital and simply recommended painting the area with Betadine. She saw infectious disease on October 27 with plans to continue doxycycline on Augmentin to continue therapy until November 14. The infectious disease provider also recommended that the patient reconsider amputation. Her PCP referred her to the wound care center, as the patient is very reluctant to undergo amputation. She also has a small ulcer on her anterior tibial surface that recently opened after a minor trauma. On exam, her left foot is rolled such that the lateral aspect of her foot is the contact surface. There is a large ulcer with exposed necrotic muscle and fat. The wound does not probe to bone, but apparently there was bone exposure in the past while she was in the hospital. No malodor or purulent drainage. The wound on her anterior tibial surface is small with just a little slough accumulation. She has modest edema and skin changes consistent with stasis  dermatitis. 07/28/2022: The anterior tibial wound is healed. The left plantar foot wound looks a little bit better today. There is significantly less necrotic tissue present. There is an area at the most caudal aspect of the wound that looks like there is ongoing pressure-induced tissue injury. Bone remains  covered. 08/11/2022: The wound has deteriorated quite a bit since her last visit. Her husband reports that she has had significantly more drainage, such that he has had to change the dressing multiple times per day. During the course of the visit, she recalled that she had not been taking her Lasix for at least 4 days. There is substantial periwound maceration and further tissue breakdown. There is necrotic tissue at the midportion of the wound and tendon is now exposed underneath this necrotic muscle. 08/17/2022: The wound looks better this week. There is still an area of necrotic muscle and tendon in the midfoot, but the forefoot has improved with some epithelium beginning to come in from the margins. Unfortunately, there is evidence of ongoing pressure-induced tissue injury near the heel. The patient does admit to resting her heel on a stool frequently. She is still taking Bactrim as prescribed and her Keystone topical antibiotic compound should arrive at their home today. 12/14; fairly large sized wound on the left plantar foot. She has a right AKA. She uses a leg only for transfers she says she does not propel her wheelchair with that foot. She has been using Keystone, silver alginate and an Ace wrap. She has completed her Bactrim and Augmentin. She is a type II diabetic. The most worrisome part of this wound is a smaller area roughly 2 x 2 in the distal part of the wound which is 100% occupied by necrotic tendon Electronic Signature(s) Signed: 08/24/2022 4:13:03 PM By: Linton Ham MD Entered By: Linton Ham on 08/24/2022  11:32:36 -------------------------------------------------------------------------------- Physical Exam Details Patient Name: Date of Service: [name], [name] 08/24/2022 9:30 A [name] Medical Record Number: [id number] Patient Account Number: [id number] Date of Birth/Sex: Treating RN: April 30, 1961 (61 y.o. F) Primary Care Provider: Riki [name] Other Clinician: Referring Provider: Treating Provider/Extender: Kathrine Haddock in Treatment: 5 Constitutional Patient is hypertensive.. Pulse regular and within target range for patient.Marland Kitchen Respirations regular, non-labored and within target range.. Temperature is normal and within the target range for the patient.Marland Kitchen Appears in no distress. Notes Wound exam; the area looks as though it is contracted somewhat. She has some eschar and some areas but not too bad. In the distal part of the wound there is an oval-shaped area with 100% nonviable tendon. I used pickups and scissors scissors to remove this and a #5 curette to debride the underlying tissue of subcutaneous debris. There was minimal bleeding no evidence of infection this does not probe to bone [name], [name] ([id number]) [id number] Page 4 of 9 Electronic Signature(s) Signed: 08/24/2022 4:13:03 PM By: Linton Ham MD Entered By: Linton Ham on 08/24/2022 11:34:05 -------------------------------------------------------------------------------- Physician Orders Details Patient Name: Date of Service: [name], [name] 08/24/2022 9:30 A [name] Medical Record Number: [id number] Patient Account Number: [id number] Date of Birth/Sex: Treating RN: [birth year] (60 y.o. Iver Nestle, Jamie Primary Care Provider: Riki [name] Other Clinician: Referring Provider: Treating Provider/Extender: Kathrine Haddock in Treatment: 5 Verbal / Phone Orders: No Diagnosis Coding Follow-up Appointments ppointment in 1 week. - Dr.  Celine Ahr - room 2 Return A Anesthetic (In clinic) Topical Lidocaine 5% applied to wound bed - In Clinic Edema Control - Lymphedema / SCD / Other Elevate legs to the level of the heart or above for 30 minutes daily and/or when sitting, a frequency of: Off-Loading Other: - Try and elevate Left leg throughout the day Wound Treatment Wound #19 - Foot Wound Laterality: Plantar, Left Cleanser: Soap and Water 1 x Per  Day/30 Days Discharge Instructions: May shower and wash wound with dial antibacterial soap and water prior to dressing change. Cleanser: Wound Cleanser (DME) (Generic) 1 x Per Day/30 Days Discharge Instructions: Cleanse the wound with wound cleanser prior to applying a clean dressing using gauze sponges, not tissue or cotton balls. Prim Dressing: Sorbalgon AG Dressing 6x6 (in/in) (DME) (Generic) 1 x Per Day/30 Days ary Discharge Instructions: Apply to wound bed as instructed Secondary Dressing: Woven Gauze Sponge, Non-Sterile 4x4 in 1 x Per Day/30 Days Discharge Instructions: Apply over primary dressing as directed. Secondary Dressing: Zetuvit Plus 4x8 in 1 x Per Day/30 Days Discharge Instructions: Apply over primary dressing as directed. Secured With: Elastic Bandage 4 inch (ACE bandage) (DME) (Generic) 1 x Per Day/30 Days Discharge Instructions: Secure with ACE bandage as directed. Secured With: [employer], 4.5x3.1 (in/yd) (DME) (Generic) 1 x Per Day/30 Days Discharge Instructions: Secure with Kerlix as directed. Secured With: 70M Medipore Public affairs consultant Surgical T 2x10 (in/yd) (DME) (Generic) 1 x Per Day/30 Days ape Discharge Instructions: Secure with tape as directed. Electronic Signature(s) Signed: 08/24/2022 4:13:03 PM By: Linton Ham MD Signed: 08/24/2022 4:51:25 PM By: Blanche East RN Entered By: Blanche East on 08/24/2022 10:59:06 [name], [name] ([id number]) [id number] Page 5 of  9 -------------------------------------------------------------------------------- Problem List Details Patient Name: Date of Service: Heslin, [name] 08/24/2022 9:30 A [name] Medical Record Number: [id number] Patient Account Number: [id number] Date of Birth/Sex: Treating RN: 1961/03/11 (61 y.o. 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 07/19/2022 No Yes M86.9 Osteomyelitis, unspecified 07/19/2022 No Yes E11.621 Type 2 diabetes mellitus with foot ulcer 07/19/2022 No Yes E66.01 Morbid (severe) obesity due to excess calories 07/19/2022 No Yes M35.00 Sjogren syndrome, unspecified 07/19/2022 No Yes I73.9 Peripheral vascular disease, unspecified 07/19/2022 No Yes K93.81 Chronic diastolic (congestive) heart failure 07/19/2022 No Yes 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 07/19/2022 No Yes Inactive Problems Resolved Problems ICD-10 Code Description Active Date Resolved Date L97.822 Non-pressure chronic ulcer of other part of left lower leg with fat layer exposed 07/19/2022 07/19/2022 Electronic Signature(s) Signed: 08/24/2022 4:13:03 PM By: Linton Ham MD Entered By: Linton Ham on 08/24/2022 11:29:26 Aydelott, [name] ([id number]) [id number] Page 6 of 9 -------------------------------------------------------------------------------- Progress Note Details Patient Name: Date of Service: [name], [name] 08/24/2022 9:30 A [name] Medical Record Number: [id number] Patient Account Number: [id number] Date of Birth/Sex: Treating RN: [birth year] (61 y.o. F) Primary Care Provider: Riki [name] Other Clinician: Referring Provider: Treating Provider/Extender: Kathrine Haddock in Treatment: 5 Subjective History of Present Illness (HPI) 07/23/2019 on evaluation today patient presents with a myriad of wounds noted at multiple locations over her right heel, right lower extremity, and abdominal region. She also has bilateral lower extremity lymphedema which is quite significant as well. Incidentally she also has congestive heart failure and hypertension. She also is obese. With that being said I think all this is contributing as well to her lower extremity edema which is very much uncontrolled. It seems like its been at least several months since she is worn any compression according to what she tells me. With that being said I am not sure exactly when that would have been. She does have fibrotic changes in the lower extremity secondary to lymphedema worse on the left than the  right. She did show me pictures of wound she had on the anterior portion of her shin which was quite significant fortunately that has healed. These issues have been intermittent at this time. Again I think that with appropriate compression therapy she may actually be doing better than what we are seeing at this point but again I am not really sure that she is ever been extremely compliant with that. Fortunately there is no signs of active infection at this time. No fever chills noted. As far as the abdominal ulcer she initially had one wound that she thinks may have been a bug bite. Subsequently she was put a dressing on this and she states that the tape pulled skin off on other locations causing other wounds that have not healed that is been about 3 months. The wounds on her lower extremities have been intermittent over 3 years. This includes the heel. 11/19; this is a patient that I have not seen previously. She was admitted to our clinic last week with multiple wounds including several superficial circular areas on her abdomen predominantly right upper quadrant. Also  1 in her umbilicus. She has an area on her right lateral malleolus which was new today. She has bilateral lower extremity edema with very significant stasis dermatitis on the left anterior tibial area. She has tightly adherent skin in her lower extremities probably secondary to cutaneous fibrosis in the area. We put her in compression last week. She comes in with the dressings reasonably saturated. 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 She has multiple areas on her arms from scratching. 12/3; the patient I saw for the first time 2 weeks ago. She has 3 small circular areas on her abdomen 2 in the right upper quadrant one on her umbilicus. We have been using silver alginate to this area. She also had an area on her right lateral malleolus and right heel and right medial malleolus. We have been using silver alginate here under compression. She does not have an open area on the left leg today. We are going to order her stockings for the left leg. She clearly has chronic lymphedema in these areas. X-ray of the foot from 10/20 did not show any fracture or dislocation. The heel wound was noted there was no evidence of osteomyelitis She complains of generalized pruritus. 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 She has longstanding lymphedema. Although she does not have known liver disease I looked at his CT scan of the abdomen from February of this year that showed hepatic steatosis 12/11; patient still has no open area on the left leg we transitioned her into a stocking she had. Her stockings from [employer] are supposed to be arriving later today which will be the stockings of choice. The only wound remaining on the right is the right heel 2 small superficial open areas.  Everything else is well on its way to healing in the right leg few small excoriations. She has 1 major area remaining on her abdomen the rest seem to be healing 12/22 patient still has no open area on the left leg and she is still in a stocking. She had still 2 open areas on the tip of her right heel. These look like pressure related areas although the patient is not really certain how this is  happening. She has an open heeled shoe that we provided. Still has 1 open area on the right lateral abdomen The patient has complaints of generalized pruritus. She has multiple excoriated areas on her abdomen that of close over her arms or thighs which I think are from scratching. She tells me that she has never had a basic work-up for this which might include basic lab work, liver functions, kidney functions, thyroid etc. She might also benefit from a dermatologist 12/29; the patient has a deeper larger more painful wound on the tip of her right heel. Again these look like pressure ulcers although she wears an open toed heel pads or heel at night to prevent it from hitting the mattress etc. They tell me and remind me that this is been present on and off for about 2 years that has closed over but then will reopen. I did look over her lab work that was available in [place]. She does not have elevated liver function tests or creatinine. I was not able to see a TSH. This was in response to her complaints of generalized itching and a itch/scratch cycle. I also noted that she is on OxyContin and oxycodone and of course narcotics can cause generalized pruritus as a side effect Readmission: History From [place], [place] Last Week: 03/29/2021 this is a patient who presents for initial evaluation here in the clinic though I have seen her 1 time previously in 2020 this was in [place]. That was actually her initial visit therefore a heel ulceration. Subsequently that did not healing she actually saw me the 1  time in November and then subsequently saw Dr. Dellia Nims through the end of December where she apparently was healed. 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 This is on the right. With that being said right now she has a basically region on the lateral portion of the ray site where she is applying more pressure especially as her foot seems to be starting to turn in and this has become an increasingly significant issue for her to be honest. She does see Dr. Sharol Given and Dr. Sharol Given has had her on doxycycline for quite a bit of time here. Subsequently she is just now wrapping that out. I think that she probably would be benefited by continue the doxycycline for a time while we can work through what we need to do with things here. She does have evidence of osteomyelitis based on what I saw on the MRI today. This obviously is unfortunate and definitely not something that she was hoping to hear. With that being said I do believe that she would potentially be a strong candidate for hyperbaric oxygen therapy. Also believe she could be a candidate for total contact cast though we have to be cautious due to the fact that how her foot is bending inward I think that this is good to be a little bit of a concern. We will definitely have to keeping a close eye on things. She does have a history of diabetes mellitus type 2. She also other than the amputation has a medical history positive for hypertension. She has never undergone hyperbaric oxygen therapy previously I did show her the chamber we did talk a little bit about it today as well. Admission [facility]: o 04/06/2021 Patient presents here in [place] today for evaluation. Again she is establishing care  here after I saw her last week in [place]. Obviously I think that she is doing extremely well at this point which is great news and in general I  am extremely pleased with where things stand from the standpoint of the appearance of the wound. Obviously this is not significantly smaller but does look a lot cleaner I think using the Hydrofera Blue has been of benefit over the past week. Nonetheless she still has a wound with issues with pressure here which I think is good to be an ongoing issue. Also think that the osteomyelitis which is chronic is also getting ongoing issue. She does want to try to do what she can to prevent this from worsening and ending with a below-knee amputation. T that end I do think that getting her into the hyperbaric oxygen chamber would be what we need to do. She has recently been seen by cardiology o and subsequently well as far as that is concerned. Her ejection fraction was appropriate for hyperbarics and everything seems to be doing great in that regard. She has not had a recent chest x-ray she is a former smoker around 15 years ago she quit. Nonetheless I do believe with her asthma we do want to do a chest [name], [name] ([id number]) [id number] Page 7 of 9 x-ray just to make sure everything is okay before proceeding with the hyperbarics although we can go ahead and see about getting the approval. I also think she is going require some sharp debridement and around this wound we also have to contact her insurance for prior approval on this as well. 04/13/2021 upon evaluation today patient appears to be doing a little bit better in regard to her leg in fact the swelling is dramatically better. Very pleased with where things stand in that regard. Fortunately there does not appear to be any signs of active infection at this time. No fevers, chills, nausea, vomiting, or diarrhea. 04/20/2021 upon evaluation today patient appears to be doing decently well in regard to her foot. There is some need for sharp debridement here today. With that being said we will get a go ahead and proceed with that  today as the foot is becoming somewhat macerated with the overhanging callus that is definitely not we want to see. With that being said the patient does have an issue here as well with a boil in the perineal region unfortunately that is an issue for her today as well. I did have a look at that as well. We are still working on dealing with insurance as far as getting hyperbarics approved. 04/27/2021 upon evaluation today patient appears to be doing somewhat poorly in general compared to where she has been. At this point she is having a lot of swelling which is the main concerning thing that I am seeing. There does not appear to be any signs of infection currently which is good news but at the same time I do feel like that she is a lot more swollen than she was even last week and is showing how much she is weeping in regard to the right lower leg. She also has a blister on the left breast although is not an open wound at this time. She continues to have the area in the perineum as well. 05/04/2021 upon evaluation today patient appears to actually be doing decently well in regard to her wound on the foot. Fortunately there is no signs of active infection at this time. No fevers, chills, nausea,  vomiting, or diarrhea. Unfortunately she does have an area on the left breast which is actually appearing to be a burn based on what I see physically. She does note that she uses rice bags for areas in general and may have left one in that area not realizing it was burning her as she does not really have much feeling. I think this is very probable based on what I am seeing. For that reason working to treat this as such I do think we need to loosen up a lot of the necrotic tissue here. I Am going tosend in a prescription for Santyl for the patient. 9/1; patient presents for HBO today but cannot do treatment due to wheezing and feeling short of breath when laying down. She was set up as a doctor visit today since she  was not able to do HBO. She states she feels okay overall and started having a dry cough over the past couple days. She has not tested herself for COVID. She denies fever/chills or sputum production. She thinks her symptoms are related to allergies. She has been using silver alginate to her right plantar foot wound. She has not been using Santyl to the breast wound because she reports forgetting to do this. She uses zinc oxide to her perennial wound. She currently denies systemic signs of infection. 9/8; patient presents for follow-up. She has been a unable to do HBO treatments for the past week due to her back pain. She is currently taking Flexeril to address this issue. She reports no issues with her compression wrap. She has been putting Santyl on the left breast wound and Monistat cream to the perennial wound. She denies signs of infection. 9/15; patient presents for follow-up. Again she is unable to do HBO treatment today due to sinus pain. She has 2 new wounds to her right foot which she is unaware of. She has been putting Santyl on the left breast wound and zinc oxide to the perineal wound. She currently denies signs of infection. Readmission 10/17/2021 Ms. Toria Monte is a 61 year old female with a past medical history of type 2 diabetes, osteomyelitis status post right BKA and morbid obesity that presents to the clinic for multiple scattered open wounds to her body. These are located to the abdomen, left posterior leg and sacral region. She has been using mupirocin ointment, Neosporin and zinc oxide to the wound beds. She has been started on Bactrim and Keflex by her primary care physician and states she has several days left to complete the course. She currently denies systemic signs of infection. READMISSION This is a 61 year old morbidly obese type II diabetic (poorly controlled with last hemoglobin A1c 9.2%). She has congestive heart failure, peripheral vascular disease, hypertension,  coronary artery disease, venous stasis, connective tissue disease (o Sjogren's syndrome), and bilateral lower extremity edema. She has been seen here for various reasons in the past. She has a right above-knee amputation. She was recently hospitalized for a left diabetic foot ulcer. Orthopedic surgery was consulted. An MRI was performed that was not conclusive for osteomyelitis, but given the extent of the necrosis, orthopedics recommended amputation. Infectious disease was also consulted and have recommended a 6-week course of oral doxycycline and Augmentin. Apparently wound care was also consulted while she was in the hospital and simply recommended painting the area with Betadine. She saw infectious disease on October 27 with plans to continue doxycycline on Augmentin to continue therapy until November 14. The infectious disease provider also recommended that  the patient reconsider amputation. Her PCP referred her to the wound care center, as the patient is very reluctant to undergo amputation. She also has a small ulcer on her anterior tibial surface that recently opened after a minor trauma. On exam, her left foot is rolled such that the lateral aspect of her foot is the contact surface. There is a large ulcer with exposed necrotic muscle and fat. The wound does not probe to bone, but apparently there was bone exposure in the past while she was in the hospital. No malodor or purulent drainage. The wound on her anterior tibial surface is small with just a little slough accumulation. She has modest edema and skin changes consistent with stasis dermatitis. 07/28/2022: The anterior tibial wound is healed. The left plantar foot wound looks a little bit better today. There is significantly less necrotic tissue present. There is an area at the most caudal aspect of the wound that looks like there is ongoing pressure-induced tissue injury. Bone remains covered. 08/11/2022: The wound has deteriorated quite a  bit since her last visit. Her husband reports that she has had significantly more drainage, such that he has had to change the dressing multiple times per day. During the course of the visit, she recalled that she had not been taking her Lasix for at least 4 days. There is substantial periwound maceration and further tissue breakdown. There is necrotic tissue at the midportion of the wound and tendon is now exposed underneath this necrotic muscle. 08/17/2022: The wound looks better this week. There is still an area of necrotic muscle and tendon in the midfoot, but the forefoot has improved with some epithelium beginning to come in from the margins. Unfortunately, there is evidence of ongoing pressure-induced tissue injury near the heel. The patient does admit to resting her heel on a stool frequently. She is still taking Bactrim as prescribed and her Keystone topical antibiotic compound should arrive at their home today. 12/14; fairly large sized wound on the left plantar foot. She has a right AKA. She uses a leg only for transfers she says she does not propel her wheelchair with that foot. She has been using Keystone, silver alginate and an Ace wrap. She has completed her Bactrim and Augmentin. She is a type II diabetic. The most worrisome part of this wound is a smaller area roughly 2 x 2 in the distal part of the wound which is 100% occupied by necrotic tendon Objective Constitutional Patient is hypertensive.. Pulse regular and within target range for patient.Marland Kitchen Respirations regular, non-labored and within target range.. Temperature is normal and within the target range for the patient.Marland Kitchen Appears in no distress. Vitals Time Taken: 10:15 AM, Height: 62 in, Weight: 284 lbs, BMI: 51.9, Temperature: 98.1 F, Pulse: 96 bpm, Respiratory Rate: 22 breaths/min, Blood Pressure: 151/69 mmHg, Capillary Blood Glucose: 154 mg/dl. [name], [name] ([id number]) [id number] Page 8 of  9 General Notes: Wound exam; the area looks as though it is contracted somewhat. She has some eschar and some areas but not too bad. In the distal part of the wound there is an oval-shaped area with 100% nonviable tendon. I used pickups and scissors scissors to remove this and a #5 curette to debride the underlying tissue of subcutaneous debris. There was minimal bleeding no evidence of infection this does not probe to bone Integumentary (Hair, Skin) Wound #19 status is Open. Original cause of wound was Gradually Appeared. The date acquired was: 05/17/2022. The wound has been in treatment 5  weeks. The wound is located on the [place]. The wound measures 10.1cm length x 4cm width x 2cm depth; 31.73cm^2 area and 63.46cm^3 volume. There is tendon and Fat Layer (Subcutaneous Tissue) exposed. There is no tunneling noted, however, there is undermining starting at 4:00 and ending at 5:00 with a maximum distance of 0.3cm. There is a large amount of serosanguineous drainage noted. The wound margin is distinct with the outline attached to the wound base. There is medium (34-66%) red, pink granulation within the wound bed. There is a medium (34-66%) amount of necrotic tissue within the wound bed including Eschar. The periwound skin appearance exhibited: Callus, Maceration, Erythema. The periwound skin appearance did not exhibit: Crepitus, Excoriation, Induration, Rash, Scarring, Dry/Scaly, Atrophie Blanche, Cyanosis, Ecchymosis, Hemosiderin Staining, Mottled, Pallor, Rubor. The surrounding wound skin color is noted with erythema. Periwound temperature was noted as No Abnormality. 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 There was a Excisional Skin/Subcutaneous Tissue/Muscle Debridement with a total area of 4 sq cm performed by Ricard Dillon., MD. With the following instrument(s): Curette, Forceps, and Scissors to remove Non-Viable tissue/material. Material removed includes Tendon, Callus, and Skin: Epidermis after achieving pain control using Lidocaine 5% topical ointment. No specimens were taken. A time out was conducted at 10:55, prior to the start of the procedure. A Minimum amount of bleeding was controlled with Pressure. The procedure was tolerated well. Post Debridement Measurements: 10.1cm length x 4cm width x 0.1cm depth; 3.173cm^3 volume. Character of Wound/Ulcer Post Debridement requires further debridement. Severity of Tissue Post Debridement is: Fat layer exposed. Post procedure Diagnosis Wound #19: Same as Pre-Procedure General Notes: Scribed for Dr. Dellia Nims by Blanche East, RN. Plan Follow-up Appointments: Return Appointment in 1 week. - Dr. Celine Ahr - room 2 Anesthetic: (In clinic) Topical Lidocaine 5% applied to wound bed - In Clinic Edema Control - Lymphedema / SCD / Other: Elevate legs to the level of the heart or above for 30 minutes daily and/or when sitting, a frequency of: Off-Loading: Other: - Try and elevate Left leg throughout the day WOUND #19: - Foot Wound Laterality: Plantar, Left Cleanser: Soap and Water 1 x Per Day/30 Days Discharge Instructions: May shower and wash wound with dial antibacterial soap and water prior to dressing change. Cleanser: Wound Cleanser (DME) (Generic) 1 x Per Day/30 Days Discharge Instructions: Cleanse the wound with wound cleanser prior to applying a clean dressing using gauze sponges,  not tissue or cotton balls. Prim Dressing: Sorbalgon AG Dressing 6x6 (in/in) (DME) (Generic) 1 x Per Day/30 Days ary Discharge Instructions: Apply to wound bed as instructed Secondary Dressing: Woven Gauze Sponge, Non-Sterile 4x4 in 1 x Per Day/30 Days Discharge Instructions: Apply over primary dressing as directed. Secondary Dressing: Zetuvit Plus 4x8 in 1 x Per Day/30 Days Discharge Instructions: Apply over primary dressing as directed. Secured With: Elastic Bandage 4 inch (ACE bandage) (DME) (Generic) 1 x Per Day/30 Days Discharge Instructions: Secure with ACE bandage as directed. Secured With: [employer], 4.5x3.1 (in/yd) (DME) (Generic) 1 x Per Day/30 Days Discharge Instructions: Secure with Kerlix  as directed. Secured With: 65M Medipore Public affairs consultant Surgical T 2x10 (in/yd) (DME) (Generic) 1 x Per Day/30 Days ape Discharge Instructions: Secure with tape as directed. 1. We continued with the same dressing which is Keystone silver alginate. She has completed her Augmentin and Bactrim. I do not think she needs any further Stukey, Clayton [name] ([id number]) [id number] Page 9 of 9 antibiotics 2. They tell me that she had an MRI of her foot in September but there was no bone infection I will need to look into this. 3. Minimal pressure is placed on his foot according to the patient and her family Electronic Signature(s) Signed: 08/24/2022 4:13:03 PM By: Linton Ham MD Entered By: Linton Ham on 08/24/2022 11:35:08 -------------------------------------------------------------------------------- SuperBill Details Patient Name: Date of Service: Rings, [name] 08/24/2022 Medical Record Number: [id number] Patient Account Number: [id number] Date of Birth/Sex: Treating RN: 1961/03/27 (61 y.o. 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

## 2022-08-31 ENCOUNTER — Other Ambulatory Visit: Payer: Self-pay

## 2022-08-31 ENCOUNTER — Telehealth: Payer: Self-pay

## 2022-08-31 MED ORDER — GABAPENTIN 400 MG PO CAPS: 400.0000 mg | ORAL_CAPSULE | Freq: Four times a day (QID) | ORAL | 3 refills | Status: DC

## 2022-08-31 NOTE — Telephone Encounter (Signed)
PA initiated via Covermymeds; KEY: [id number]. PA approved.   [id number];FTNBZX:[id number];Review Type:Prior Auth;Coverage Start Date:08/31/2022;Coverage End Date:08/31/2023;

## 2022-08-31 NOTE — Telephone Encounter (Signed)
Patient advised pharmacy never got Rx for gabapentin, resent in Rx

## 2022-09-01 ENCOUNTER — Other Ambulatory Visit: Payer: Self-pay | Admitting: Family Medicine

## 2022-09-07 ENCOUNTER — Other Ambulatory Visit: Payer: Self-pay | Admitting: Family Medicine

## 2022-09-07 ENCOUNTER — Telehealth: Payer: Self-pay

## 2022-09-07 ENCOUNTER — Encounter (HOSPITAL_BASED_OUTPATIENT_CLINIC_OR_DEPARTMENT_OTHER): Payer: Commercial Managed Care - HMO | Admitting: General Surgery

## 2022-09-07 DIAGNOSIS — E11621 Type 2 diabetes mellitus with foot ulcer: Secondary | ICD-10-CM | POA: Diagnosis not present

## 2022-09-07 NOTE — Progress Notes (Signed)
Seehafer, Valaria [name] ([id number]) [id number] Page 1 of 6 Visit Report for 09/07/2022 Arrival Information Details Patient Name: Date of Service: [name], [name] [name]. 09/07/2022 9:15 A [name] Medical Record Number: [id number] Patient Account Number: [id number] Date of Birth/Sex: Treating RN: 04-17-1961 (61 y.o. F) Primary Care Marilena Trevathan: [name] Other Clinician: Referring Jeslyn Amsler: Treating Pacey Altizer/Extender: Vivien Rossetti in Treatment: 7 Visit Information History Since Last Visit All ordered tests and consults were completed: No Patient Arrived: Wheel Chair Added or deleted any medications: No Arrival Time: 09:19 Any new allergies or adverse reactions: No Accompanied By: husband Had a fall or experienced change in No Transfer Assistance: None activities of daily living that may affect Patient Identification Verified: Yes risk of falls: Secondary Verification Process Completed: Yes Signs or symptoms of abuse/neglect since last visito No Patient Requires Transmission-Based Precautions: No Hospitalized since last visit: No Patient Has Alerts: No Implantable device outside of the clinic excluding No cellular tissue based products placed in the center since last visit: Pain Present Now: No Electronic Signature(s) Signed: 09/07/2022 11:05:54 AM By: Dayton Scrape Entered By: Dayton Scrape on 09/07/2022 09:19:40 -------------------------------------------------------------------------------- Lower Extremity Assessment Details Patient Name: Date of Service: [name] [name] 09/07/2022 9:15 A [name] Medical Record Number: [id number] Patient Account Number: [id number] Date of Birth/Sex: Treating RN: 1961/06/21 (61 y.o. Fredderick Phenix Primary Care Erinne Gillentine: [name] Other Clinician: Referring Indigo Chaddock: Treating Marnie Fazzino/Extender: Vivien Rossetti in Treatment: 7 Edema Assessment Assessed: [Left:  No] [Right: No] Edema: [Left: Ye] [Right: s] Calf Left: Right: Point of Measurement: 30 cm From Medial Instep 45 cm Ankle Left: Right: Point of Measurement: 9 cm From Medial Instep 26 cm Electronic Signature(s) Signed: 09/07/2022 4:31:58 PM By: Samuella Bruin Entered By: Samuella Bruin on 09/07/2022 09:25:20 Millikin, Zeynep [name] ([id number]) [id number] Page 2 of 6 -------------------------------------------------------------------------------- Multi Wound Chart Details Patient Name: Date of Service: [name] [name] 09/07/2022 9:15 A [name] Medical Record Number: [id number] Patient Account Number: [id number] Date of Birth/Sex: Treating RN: 1960-09-13 (61 y.o. F) Primary Care Eduarda Scrivens: [name] Other Clinician: Referring Katie Moch: Treating Shavette Shoaff/Extender: Vivien Rossetti in Treatment: 7 Vital Signs Height(in): 62 Capillary Blood Glucose(mg/dl): 335 Weight(lbs): 456 Pulse(bpm): 91 Body Mass Index(BMI): 51.9 Blood Pressure(mmHg): 147/85 Temperature(F): 98.0 Respiratory Rate(breaths/min): 20 [19:Photos:] [N/A:N/A] Left, Plantar Foot N/A N/A Wound Location: Gradually Appeared N/A N/A Wounding Event: Diabetic Wound/Ulcer of the Lower N/A N/A Primary Etiology: Extremity Cataracts, Lymphedema, Asthma, N/A N/A Comorbid History: Congestive Heart Failure, Coronary Artery Disease, Hypertension, Peripheral Arterial Disease, Peripheral Venous Disease, Type II Diabetes, Rheumatoid Arthritis, Neuropathy 05/17/2022 N/A N/A Date Acquired: 7 N/A N/A Weeks of Treatment: Open N/A N/A Wound Status: No N/A N/A Wound Recurrence: 10x3.6x0.4 N/A N/A Measurements L x W x D (cm) 28.274 N/A N/A A (cm) : rea 11.31 N/A N/A Volume (cm) : -25.90% N/A N/A % Reduction in A rea: -25.90% N/A N/A % Reduction in Volume: Grade 2 N/A N/A Classification: Large N/A N/A Exudate A mount: Serosanguineous N/A N/A Exudate  Type: red, brown N/A N/A Exudate Color: Distinct, outline attached N/A N/A Wound Margin: Large (67-100%) N/A N/A Granulation A mount: Red, Pink N/A N/A Granulation Quality: Small (1-33%) N/A N/A Necrotic A mount: Eschar N/A N/A Necrotic Tissue: Fat Layer (Subcutaneous Tissue): Yes N/A N/A Exposed Structures: Tendon: Yes Fascia: No Muscle: No Joint: No Bone: No Small (1-33%) N/A N/A Epithelialization: Debridement - Excisional N/A N/A Debridement: Pre-procedure Verification/Time Out 09:32 N/A N/A Taken: Lidocaine 5% topical ointment N/A N/A  Pain Control: Necrotic/Eschar, Subcutaneous, N/A N/A Tissue Debrided: Slough Skin/Subcutaneous Tissue N/A N/A Level: 21 N/A N/A Debridement A (sq cm): rea Curette N/A N/A Instrument: Minimum N/A N/A Bleeding: Pressure N/A N/A Hemostasis Achieved: Procedure was tolerated well N/A N/A Debridement Treatment Response: Cillo, Miachel Roux ([id number]) [id number] Page 3 of 6 Post Debridement Measurements L x 10x3.6x0.4 N/A N/A W x D (cm) 11.31 N/A N/A Post Debridement Volume: (cm) Callus: Yes N/A N/A Periwound Skin Texture: Excoriation: No Induration: No Crepitus: No Rash: No Scarring: No Maceration: Yes N/A N/A Periwound Skin Moisture: Dry/Scaly: No Erythema: Yes N/A N/A Periwound Skin Color: Atrophie Blanche: No Cyanosis: No Ecchymosis: No Hemosiderin Staining: No Mottled: No Pallor: No Rubor: No No Abnormality N/A N/A Temperature: Debridement N/A N/A Procedures Performed: Treatment Notes Electronic Signature(s) Signed: 09/07/2022 9:38:59 AM By: Duanne Guess MD FACS Entered By: Duanne Guess on 09/07/2022 09:38:59 -------------------------------------------------------------------------------- Multi-Disciplinary Care Plan Details Patient Name: Date of Service: [name] [name] 09/07/2022 9:15 A [name] Medical Record Number: [id number] Patient Account Number: [id number] Date of  Birth/Sex: Treating RN: 1960-09-20 (61 y.o. Fredderick Phenix Primary Care Nikkol Pai: [name] Other Clinician: Referring Oline Belk: Treating Nareh Matzke/Extender: Vivien Rossetti in Treatment: 7 Active Inactive Venous Leg Ulcer Nursing Diagnoses: Potential for venous Insuffiency (use before diagnosis confirmed) Goals: Patient will maintain optimal edema control Date Initiated: 07/19/2022 Target Resolution Date: 10/05/2022 Goal Status: Active Interventions: Assess peripheral edema status every visit. Treatment Activities: Therapeutic compression applied : 07/19/2022 Notes: Electronic Signature(s) Signed: 09/07/2022 4:31:58 PM By: Samuella Bruin Entered By: Samuella Bruin on 09/07/2022 09:31:51 Hoog, Jerrilyn [name] ([id number]) [id number] Page 4 of 6 -------------------------------------------------------------------------------- Pain Assessment Details Patient Name: Date of Service: Mcburney, [name] [name] 09/07/2022 9:15 A [name] Medical Record Number: [id number] Patient Account Number: [id number] Date of Birth/Sex: Treating RN: [birth year] (61 y.o. F) Primary Care Lekendrick Alpern: [name] Other Clinician: Referring Ulysess Witz: Treating Joleen Stuckert/Extender: Vivien Rossetti in Treatment: 7 Active Problems Location of Pain Severity and Description of Pain Patient Has Paino Yes Site Locations Rate the pain. Current Pain Level: 6 Worst Pain Level: 10 Least Pain Level: 0 Tolerable Pain Level: 2 Pain Management and Medication Current Pain Management: Electronic Signature(s) Signed: 09/07/2022 11:05:54 AM By: Dayton Scrape Entered By: Dayton Scrape on 09/07/2022 09:20:29 -------------------------------------------------------------------------------- Patient/Caregiver Education Details Patient Name: Date of Service: Ney, [name] [name] 12/28/2023andnbsp9:15 A [name] Medical Record Number:  [id number] Patient Account Number: [id number] Date of Birth/Gender: Treating RN: 1961/07/16 (61 y.o. Fredderick Phenix Primary Care Physician: [name] Other Clinician: Referring Physician: Treating Physician/Extender: Vivien Rossetti in Treatment: 7 Education Assessment Education Provided To: Patient Education Topics Provided Safety: Methods: Explain/Verbal Responses: Reinforcements needed, State content correctly Graybill, Rhylee [name] ([id number]) [id number] Page 5 of 6 Electronic Signature(s) Signed: 09/07/2022 4:31:58 PM By: Samuella Bruin Entered By: Samuella Bruin on 09/07/2022 09:32:05 -------------------------------------------------------------------------------- Wound Assessment Details Patient Name: Date of Service: Azpeitia, [name] [name] 09/07/2022 9:15 A [name] Medical Record Number: [id number] Patient Account Number: [id number] Date of Birth/Sex: Treating RN: 20-Oct-1960 (61 y.o. Fredderick Phenix Primary Care Rayona Sardinha: [name] Other Clinician: Referring Texie Tupou: Treating Isatou Agredano/Extender: Vivien Rossetti in Treatment: 7 Wound Status Wound Number: 19 Primary Diabetic Wound/Ulcer of the Lower Extremity Etiology: Wound Location: Left, Plantar Foot Wound Open Wounding Event: Gradually Appeared Status: Date Acquired: 05/17/2022 Comorbid Cataracts, Lymphedema, Asthma, Congestive Heart Failure, Weeks Of Treatment: 7 History: Coronary Artery Disease, Hypertension, Peripheral Arterial Disease, Clustered Wound: No Peripheral Venous Disease, Type II Diabetes,  Rheumatoid Arthritis, Neuropathy Photos Wound Measurements Length: (cm) 10 Width: (cm) 3.6 Depth: (cm) 0.4 Area: (cm) 28.274 Volume: (cm) 11.31 % Reduction in Area: -25.9% % Reduction in Volume: -25.9% Epithelialization: Small (1-33%) Tunneling: No Undermining: No Wound Description Classification: Grade  2 Wound Margin: Distinct, outline attached Exudate Amount: Large Exudate Type: Serosanguineous Exudate Color: red, brown Foul Odor After Cleansing: No Slough/Fibrino Yes Wound Bed Granulation Amount: Large (67-100%) Exposed Structure Granulation Quality: Red, Pink Fascia Exposed: No Necrotic Amount: Small (1-33%) Fat Layer (Subcutaneous Tissue) Exposed: Yes Necrotic Quality: Eschar Tendon Exposed: Yes Muscle Exposed: No Joint Exposed: No Bone Exposed: No Periwound Skin Texture Texture Color No Abnormalities Noted: No No Abnormalities Noted: No Callus: Yes Atrophie Blanche: No Crepitus: No Cyanosis: No Faso, Mirabel [name] ([id number]) [id number] Page 6 of 6 Excoriation: No Ecchymosis: No Induration: No Erythema: Yes Rash: No Hemosiderin Staining: No Scarring: No Mottled: No Pallor: No Moisture Rubor: No No Abnormalities Noted: No Dry / Scaly: No Temperature / Pain Maceration: Yes Temperature: No Abnormality Electronic Signature(s) Signed: 09/07/2022 4:31:58 PM By: Samuella Bruin Entered By: Samuella Bruin on 09/07/2022 09:27:40 -------------------------------------------------------------------------------- Vitals Details Patient Name: Date of Service: Spanos, [name] [name] 09/07/2022 9:15 A [name] Medical Record Number: [id number] Patient Account Number: [id number] Date of Birth/Sex: Treating RN: [birth year] (61 y.o. F) Primary Care Lethia Donlon: [name] Other Clinician: Referring Gor Vestal: Treating Zaniel Marineau/Extender: Vivien Rossetti in Treatment: 7 Vital Signs Time Taken: 09:15 Temperature (F): 98.0 Height (in): 62 Pulse (bpm): 91 Weight (lbs): 284 Respiratory Rate (breaths/min): 20 Body Mass Index (BMI): 51.9 Blood Pressure (mmHg): 147/85 Capillary Blood Glucose (mg/dl): 383 Reference Range: 80 - 120 mg / dl Electronic Signature(s) Signed: 09/07/2022 11:05:54 AM By: Dayton Scrape Entered By:  Dayton Scrape on 09/07/2022 09:20:13

## 2022-09-07 NOTE — Telephone Encounter (Signed)
PA for Diclofenac Sodium 2% submitted

## 2022-09-07 NOTE — Progress Notes (Signed)
Link, [name] ([id number]) [id number] Page 1 of 13 Visit Report for 09/07/2022 Chief Complaint Document Details Patient Name: Date of Service: [name], [name] [name] 09/07/2022 9:15 A [name] Medical Record Number: [id number] Patient Account Number: [id number] Date of Birth/Sex: Treating RN: [birth year] (61 y.o. F) Primary Care Provider: Riki [name] Other Clinician: Referring Provider: Treating Provider/Extender: Louann Sjogren in Treatment: 7 Information Obtained from: Patient Chief Complaint 10/17/2021; multiple scattered wounds to the abdomen, posterior left leg, left labia and sacrum 07/19/2022: left foot DFU Electronic Signature(s) Signed: 09/07/2022 9:39:09 AM By: Fredirick Maudlin MD FACS Entered By: Fredirick Maudlin on 09/07/2022 09:39:09 -------------------------------------------------------------------------------- Debridement Details Patient Name: Date of Service: [name], [name] [name] 09/07/2022 9:15 A [name] Medical Record Number: [id number] Patient Account Number: [id number] Date of Birth/Sex: Treating RN: March 14, 1961 (61 y.o. Harlow Ohms Primary Care Provider: Riki [name] Other Clinician: Referring Provider: Treating Provider/Extender: Louann Sjogren in Treatment: 7 Debridement Performed for Assessment: Wound #19 [place] Performed By: Physician Fredirick Maudlin, MD Debridement Type: Debridement Severity of Tissue Pre Debridement: Fat layer exposed Level of Consciousness (Pre-procedure): Awake and Alert Pre-procedure Verification/Time Out Yes - 09:32 Taken: Start Time: 09:32 Pain Control: Lidocaine 5% topical ointment T Area Debrided (L x W): otal 7 (cm) x 3 (cm) = 21 (cm) Tissue and other material debrided: Non-Viable, Eschar, Slough, Subcutaneous, Slough Level: Skin/Subcutaneous Tissue Debridement Description: Excisional Instrument: Curette Bleeding:  Minimum Hemostasis Achieved: Pressure Response to Treatment: Procedure was tolerated well Level of Consciousness (Post- Awake and Alert procedure): Post Debridement Measurements of Total Wound Length: (cm) 10 Width: (cm) 3.6 Depth: (cm) 0.4 Volume: (cm) 11.31 Character of Wound/Ulcer Post Debridement: Improved Severity of Tissue Post Debridement: Fat layer exposed [name] ([id number]) [id number] Page 2 of 13 Post Procedure Diagnosis Same as Pre-procedure Notes scribed for Dr. Celine Ahr by [name], RN Electronic Signature(s) Signed: 09/07/2022 10:27:26 AM By: Fredirick Maudlin MD FACS Signed: 09/07/2022 4:31:58 PM By: Sabas Sous By: [name] on 09/07/2022 09:34:44 -------------------------------------------------------------------------------- HPI Details Patient Name: Date of Service: [name], [name] [name] 09/07/2022 9:15 A [name] Medical Record Number: [id number] Patient Account Number: [id number] Date of Birth/Sex: Treating RN: [birth year] (61 y.o. F) Primary Care Provider: Riki [name] Other Clinician: Referring Provider: Treating Provider/Extender: Louann Sjogren in Treatment: 7 History of Present Illness HPI Description: 07/23/2019 on evaluation today patient presents with a myriad of wounds noted at multiple locations over her right heel, right lower extremity, and abdominal region. She also has bilateral lower extremity lymphedema which is quite significant as well. Incidentally she also has congestive heart failure and hypertension. She also is obese. With that being said I think all this is contributing as well to her lower extremity edema which is very much uncontrolled. It seems like its been at least several months since she is worn any compression according to what she tells me. With that being said I am not sure exactly when that would have been. She does have fibrotic  changes in the lower extremity secondary to lymphedema worse on the left than the right. She did show me pictures of wound she had on the anterior portion of her shin which was quite significant fortunately that has healed. These issues have been intermittent at this time. Again I think that with appropriate compression therapy she may actually be doing better than what we are seeing at this point but again I am not really sure that she is ever been extremely compliant with  that. Fortunately there is no signs of active infection at this time. No fever chills noted. As far as the abdominal ulcer she initially had one wound that she thinks may have been a bug bite. Subsequently she was put a dressing on this and she states that the tape pulled skin off on other locations causing other wounds that have not healed that is been about 3 months. The wounds on her lower extremities have been intermittent over 3 years. This includes the heel. 11/19; this is a patient that I have not seen previously. She was admitted to our clinic last week with multiple wounds including several superficial circular areas on her abdomen predominantly right upper quadrant. Also 1 in her umbilicus. She has an area on her right lateral malleolus which was new today. She has bilateral lower extremity edema with very significant stasis dermatitis on the left anterior tibial area. She has tightly adherent skin in her lower extremities probably secondary to cutaneous fibrosis in the area. We put her in compression last week. She comes in with the dressings reasonably saturated. 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 She has multiple areas on her arms from scratching. 12/3; the patient I saw for the first time 2 weeks ago. She has 3 small  circular areas on her abdomen 2 in the right upper quadrant one on her umbilicus. We have been using silver alginate to this area. She also had an area on her right lateral malleolus and right heel and right medial malleolus. We have been using silver alginate here under compression. She does not have an open area on the left leg today. We are going to order her stockings for the left leg. She clearly has chronic lymphedema in these areas. X-ray of the foot from 10/20 did not show any fracture or dislocation. The heel wound was noted there was no evidence of osteomyelitis She complains of generalized pruritus. 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 She has longstanding lymphedema. Although she does not have known liver disease I looked at his CT scan of the abdomen from February of this year that showed hepatic steatosis 12/11; patient still has no open area on the left leg we transitioned her into a stocking she had. Her stockings from [facility] are supposed to be arriving later today which will be the stockings of choice. The only wound remaining on the right is the right heel 2 small superficial open areas. Everything else is well on its way to healing in the right leg few small excoriations. She has 1 major area remaining on her abdomen the rest seem to be healing 12/22 patient still has no open area on the left leg and she is still in a stocking. She had still 2 open areas on the tip of her right heel. These look like pressure related areas although the patient is not really certain how this is happening. She has an open heeled shoe that we provided. Still has 1 open area on the right lateral abdomen The patient has complaints of generalized pruritus. She has multiple excoriated areas on her abdomen that of close over her arms or thighs which I think are from scratching. She tells me that she has never had a basic work-up for this which might include basic  lab work, liver functions,  kidney functions, thyroid etc. She might also benefit from a dermatologist 12/29; the patient has a deeper larger more painful wound on the tip of her right heel. Again these look like pressure ulcers although she wears an open toed heel pads or heel at night to prevent it from hitting the mattress etc. They tell me and remind me that this is been present on and off for about 2 years that has closed over but then will reopen. I did look over her lab work that was available in [name]. She does not have elevated liver function tests or creatinine. I was not able to see a TSH. This was in response to her complaints of generalized itching and a itch/scratch cycle. I also noted that she is on OxyContin and oxycodone and of course narcotics can cause generalized pruritus as a side effect Readmission: History From [place], [place] Last Week: 03/29/2021 this is a patient who presents for initial evaluation here in the clinic though I have seen her 1 time previously in 2020 this was in [place]. That was actually her initial visit therefore a heel ulceration. Subsequently that did not healing she actually saw me the 1 time in November and then subsequently saw Dr. Dellia Nims through the end of December where she apparently was healed. 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 This is on the right. With that being said right now she has a basically region on the lateral portion of the ray site where she is applying more pressure especially as her foot seems to be starting to turn in and this has become an increasingly Kunkle, Mariesa [name] ([id number]) [id number] Page 3 of 13 significant issue for her to be honest. She does see Dr. Sharol Given and Dr. Sharol Given has had her on doxycycline for quite a bit of time here. Subsequently she is just now wrapping that  out. I think that she probably would be benefited by continue the doxycycline for a time while we can work through what we need to do with things here. She does have evidence of osteomyelitis based on what I saw on the MRI today. This obviously is unfortunate and definitely not something that she was hoping to hear. With that being said I do believe that she would potentially be a strong candidate for hyperbaric oxygen therapy. Also believe she could be a candidate for total contact cast though we have to be cautious due to the fact that how her foot is bending inward I think that this is good to be a little bit of a concern. We will definitely have to keeping a close eye on things. She does have a history of diabetes mellitus type 2. She also other than the amputation has a medical history positive for hypertension. She has never undergone hyperbaric oxygen therapy previously I did show her the chamber we did talk a little bit about it today as well. Admission [facility]: o 04/06/2021 Patient presents here in [place] today for evaluation. Again she is establishing care here after I saw her last week in [place]. Obviously I think that she is doing extremely well at this point which is great news and in general I am extremely pleased with where things stand from the standpoint of the appearance of the wound. Obviously this is not significantly smaller but does look a lot cleaner I think using the  Hydrofera Blue has been of benefit over the past week. Nonetheless she still has a wound with issues with pressure here which I think is good to be an ongoing issue. Also think that the osteomyelitis which is chronic is also getting ongoing issue. She does want to try to do what she can to prevent this from worsening and ending with a below-knee amputation. T that end I do think that getting her into the hyperbaric oxygen chamber would be what we need to do. She has recently been seen by  cardiology o and subsequently well as far as that is concerned. Her ejection fraction was appropriate for hyperbarics and everything seems to be doing great in that regard. She has not had a recent chest x-ray she is a former smoker around 15 years ago she quit. Nonetheless I do believe with her asthma we do want to do a chest x-ray just to make sure everything is okay before proceeding with the hyperbarics although we can go ahead and see about getting the approval. I also think she is going require some sharp debridement and around this wound we also have to contact her insurance for prior approval on this as well. 04/13/2021 upon evaluation today patient appears to be doing a little bit better in regard to her leg in fact the swelling is dramatically better. Very pleased with where things stand in that regard. Fortunately there does not appear to be any signs of active infection at this time. No fevers, chills, nausea, vomiting, or diarrhea. 04/20/2021 upon evaluation today patient appears to be doing decently well in regard to her foot. There is some need for sharp debridement here today. With that being said we will get a go ahead and proceed with that today as the foot is becoming somewhat macerated with the overhanging callus that is definitely not we want to see. With that being said the patient does have an issue here as well with a boil in the perineal region unfortunately that is an issue for her today as well. I did have a look at that as well. We are still working on dealing with insurance as far as getting hyperbarics approved. 04/27/2021 upon evaluation today patient appears to be doing somewhat poorly in general compared to where she has been. At this point she is having a lot of swelling which is the main concerning thing that I am seeing. There does not appear to be any signs of infection currently which is good news but at the same time I do feel like that she is a lot more swollen than  she was even last week and is showing how much she is weeping in regard to the right lower leg. She also has a blister on the left breast although is not an open wound at this time. She continues to have the area in the perineum as well. 05/04/2021 upon evaluation today patient appears to actually be doing decently well in regard to her wound on the foot. Fortunately there is no signs of active infection at this time. No fevers, chills, nausea, vomiting, or diarrhea. Unfortunately she does have an area on the left breast which is actually appearing to be a burn based on what I see physically. She does note that she uses rice bags for areas in general and may have left one in that area not realizing it was burning her as she does not really have much feeling. I think this is very probable based on what  I am seeing. For that reason working to treat this as such I do think we need to loosen up a lot of the necrotic tissue here. I Am going tosend in a prescription for Santyl for the patient. 9/1; patient presents for HBO today but cannot do treatment due to wheezing and feeling short of breath when laying down. She was set up as a doctor visit today since she was not able to do HBO. She states she feels okay overall and started having a dry cough over the past couple days. She has not tested herself for COVID. She denies fever/chills or sputum production. She thinks her symptoms are related to allergies. She has been using silver alginate to her right plantar foot wound. She has not been using Santyl to the breast wound because she reports forgetting to do this. She uses zinc oxide to her perennial wound. She currently denies systemic signs of infection. 9/8; patient presents for follow-up. She has been a unable to do HBO treatments for the past week due to her back pain. She is currently taking Flexeril to address this issue. She reports no issues with her compression wrap. She has been putting Santyl on  the left breast wound and Monistat cream to the perennial wound. She denies signs of infection. 9/15; patient presents for follow-up. Again she is unable to do HBO treatment today due to sinus pain. She has 2 new wounds to her right foot which she is unaware of. She has been putting Santyl on the left breast wound and zinc oxide to the perineal wound. She currently denies signs of infection. Readmission 10/17/2021 Ms. [name] [name] is a 61 year old female with a past medical history of type 2 diabetes, osteomyelitis status post right BKA and morbid obesity that presents to the clinic for multiple scattered open wounds to her body. These are located to the abdomen, left posterior leg and sacral region. She has been using mupirocin ointment, Neosporin and zinc oxide to the wound beds. She has been started on Bactrim and Keflex by her primary care physician and states she has several days left to complete the course. She currently denies systemic signs of infection. READMISSION This is a 61 year old morbidly obese type II diabetic (poorly controlled with last hemoglobin A1c 9.2%). She has congestive heart failure, peripheral vascular disease, hypertension, coronary artery disease, venous stasis, connective tissue disease (o Sjogren's syndrome), and bilateral lower extremity edema. She has been seen here for various reasons in the past. She has a right above-knee amputation. She was recently hospitalized for a left diabetic foot ulcer. Orthopedic surgery was consulted. An MRI was performed that was not conclusive for osteomyelitis, but given the extent of the necrosis, orthopedics recommended amputation. Infectious disease was also consulted and have recommended a 6-week course of oral doxycycline and Augmentin. Apparently wound care was also consulted while she was in the hospital and simply recommended painting the area with Betadine. She saw infectious disease on October 27 with plans to continue  doxycycline on Augmentin to continue therapy until November 14. The infectious disease provider also recommended that the patient reconsider amputation. Her PCP referred her to the wound care center, as the patient is very reluctant to undergo amputation. She also has a small ulcer on her anterior tibial surface that recently opened after a minor trauma. On exam, her left foot is rolled such that the lateral aspect of her foot is the contact surface. There is a large ulcer with exposed necrotic muscle and fat.  The wound does not probe to bone, but apparently there was bone exposure in the past while she was in the hospital. No malodor or purulent drainage. The wound on her anterior tibial surface is small with just a little slough accumulation. She has modest edema and skin changes consistent with stasis dermatitis. 07/28/2022: The anterior tibial wound is healed. The left plantar foot wound looks a little bit better today. There is significantly less necrotic tissue present. There is an area at the most caudal aspect of the wound that looks like there is ongoing pressure-induced tissue injury. Bone remains covered. 08/11/2022: The wound has deteriorated quite a bit since her last visit. Her husband reports that she has had significantly more drainage, such that he has had to change the dressing multiple times per day. During the course of the visit, she recalled that she had not been taking her Lasix for at least 4 days. There is substantial periwound maceration and further tissue breakdown. There is necrotic tissue at the midportion of the wound and tendon is now exposed underneath this necrotic muscle. 08/17/2022: The wound looks better this week. There is still an area of necrotic muscle and tendon in the midfoot, but the forefoot has improved with some epithelium beginning to come in from the margins. Unfortunately, there is evidence of ongoing pressure-induced tissue injury near the heel. The  patient does admit to resting her heel on a stool frequently. She is still taking Bactrim as prescribed and her Keystone topical antibiotic compound should arrive at their home today. 12/14; fairly large sized wound on the left plantar foot. She has a right AKA. She uses a leg only for transfers she says she does not propel her wheelchair with that foot. She has been using Keystone, silver alginate and an Ace wrap. She has completed her Bactrim and Augmentin. She is a type II diabetic. The most worrisome part of this wound is a smaller area roughly 2 x 2 in the distal part of the wound which is 100% occupied by necrotic tendon [name], [name] ([id number]) [id number] Page 4 of 13 09/07/2022: She had an extensive debridement at her last visit. The wound is smaller today and all of the tissues appear healthier. Electronic Signature(s) Signed: 09/07/2022 9:39:55 AM By: Fredirick Maudlin MD FACS Entered By: Fredirick Maudlin on 09/07/2022 09:39:55 -------------------------------------------------------------------------------- Physical Exam Details Patient Name: Date of Service: Debono, [name] [name] 09/07/2022 9:15 A [name] Medical Record Number: [id number] Patient Account Number: [id number] Date of Birth/Sex: Treating RN: [birth year] (61 y.o. F) Primary Care Provider: Riki [name] Other Clinician: Referring Provider: Treating Provider/Extender: Louann Sjogren in Treatment: 7 Constitutional Slightly hypertensive. . . . No acute distress. Respiratory Normal work of breathing on room air. Notes 09/07/2022: She had an extensive debridement at her last visit. The wound is smaller today and all of the tissues appear healthier. Electronic Signature(s) Signed: 09/07/2022 9:40:35 AM By: Fredirick Maudlin MD FACS Entered By: Fredirick Maudlin on 09/07/2022  09:40:35 -------------------------------------------------------------------------------- Physician Orders Details Patient Name: Date of Service: Kiener, [name] [name] 09/07/2022 9:15 A [name] Medical Record Number: [id number] Patient Account Number: [id number] Date of Birth/Sex: Treating RN: 1960/10/11 (61 y.o. 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 - Dr. Celine Ahr - room 2 Return A Puff, Zayleigh [name] ([id number]) [id number] Page 5 of 13 Anesthetic (In clinic) Topical Lidocaine 5% applied to wound bed - In Clinic Bathing/ Shower/ Hygiene May shower and wash wound with soap and water. - with dressing changes Edema Control - Lymphedema / SCD / Other Elevate legs to the level of the heart or above for 30 minutes daily and/or when sitting for 3-4 times a day throughout the day. Avoid standing for long periods of time. Off-Loading Other: - Try and elevate Left leg throughout the day [facility] for wound care. Other Home Health Orders/Instructions: - [facility] Wound #19 - Foot Wound Laterality: Plantar, Left Cleanser: Soap and Water 1 x Per Day/30 Days Discharge Instructions: May shower and wash wound with dial antibacterial soap and water prior to dressing  change. Cleanser: Wound Cleanser (Generic) 1 x Per Day/30 Days Discharge Instructions: Cleanse the wound with wound cleanser prior to applying a clean dressing using gauze sponges, not tissue or cotton balls. Prim Dressing: Sorbalgon AG Dressing 6x6 (in/in) (Generic) 1 x Per Day/30 Days ary Discharge Instructions: Apply to wound bed as instructed Secondary Dressing: Woven Gauze Sponge, Non-Sterile 4x4 in 1 x Per Day/30 Days Discharge Instructions: Apply over primary dressing as directed. Secondary Dressing: Zetuvit Plus 4x8 in 1 x Per Day/30 Days Discharge Instructions: Apply over primary dressing as directed. Secured With: Elastic Bandage 4 inch (ACE bandage) (Generic) 1 x Per Day/30 Days Discharge Instructions: Secure with ACE bandage as directed. Secured With: [employer], 4.5x3.1 (in/yd) (Generic) 1 x Per Day/30 Days Discharge Instructions: Secure with Kerlix as directed. Secured With: 68M Medipore Public affairs consultant Surgical T 2x10 (in/yd) (Generic) 1 x Per Day/30 Days ape Discharge Instructions: Secure with tape as directed. Patient Medications llergies: peanut, morphine, gabapentin, iodine, Victoza, Pepto-Bismol, fish oil, Fish Containing Products, Crestor, lovastatin, metronidazole, Red Yeast Rice A ([place] Purpureus), rotigotine, Atrovent, Suprep Bowel Prep Kit, adhesive tape, Betadine, Dial soap, Suprep Bowel Prep Kit Notifications Medication Indication Start End 09/07/2022 lidocaine DOSE topical 5 % ointment - ointment topical Electronic Signature(s) Signed: 09/07/2022 10:27:26 AM By: Fredirick Maudlin MD FACS Entered By: Fredirick Maudlin on 09/07/2022 09:41:43 -------------------------------------------------------------------------------- Problem List Details Patient Name: Date of Service: Matas, [name] [name] 09/07/2022 9:15 A [name] Medical Record Number: [id number] Patient Account Number: [id number] Date of Birth/Sex: Treating RN: 1960/10/03 (61 y.o. 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 07/19/2022 No Yes M86.9 Osteomyelitis, unspecified 07/19/2022 No Yes E11.621 Type 2 diabetes mellitus with foot ulcer 07/19/2022 No Yes E66.01 Morbid (severe) obesity due to excess calories 07/19/2022 No Yes M35.00 Sjogren syndrome, unspecified 07/19/2022 No Yes I73.9 Peripheral vascular disease, unspecified 07/19/2022 No Yes T36.46 Chronic diastolic (congestive) heart failure 07/19/2022 No Yes 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 09/07/2022 9:15 A [name] Medical Record Number: [id number] Patient Account Number: [id number] Date of Birth/Sex: Treating RN: Jul 15, 1961 (61 y.o. F) Primary Care Provider: Riki [name] Other Clinician: Referring Provider: Treating Provider/Extender: Louann Sjogren in Treatment: 7 Subjective Chief Complaint Information obtained from Patient 10/17/2021; multiple scattered wounds to the abdomen, posterior left leg, left labia and sacrum 07/19/2022: left foot DFU [name], [name] ([id number]) [id number] Page 7 of  13 History of Present Illness (HPI) 07/23/2019 on evaluation today patient presents with a myriad of wounds noted at multiple locations over her right heel, right lower extremity, and abdominal region. She also has bilateral lower extremity lymphedema which is quite significant as well. Incidentally she also has congestive heart failure and hypertension. She also is obese. With that being said I think all this is contributing as well to her lower extremity edema which is very much uncontrolled. It seems like its been at least several months since she is worn any compression according to what she tells me. With that being said I am not sure exactly when that would have been. She does have fibrotic changes in the lower extremity secondary to lymphedema worse on the left than the right. She did show me pictures of wound she had on the anterior portion of her shin which was quite significant fortunately that has healed. These issues have been intermittent at this time. Again I think that with appropriate compression therapy she may actually be doing better than what we are seeing at this point but again I am not really sure that she is ever been extremely compliant with that. Fortunately there is no signs of active infection at this time. No fever chills noted. As far as the abdominal ulcer she initially had one wound that she thinks may have been a bug bite. Subsequently she was put a dressing on this and she states that the tape pulled skin off on other locations causing other wounds that have not healed that is been about 3 months. The wounds on her lower extremities have been intermittent over 3 years. This includes the heel. 11/19; this is a patient that I have not seen previously. She was admitted to our clinic last week with multiple wounds including several superficial circular areas on her abdomen predominantly right upper quadrant. Also 1 in her umbilicus. She has an area on her right lateral  malleolus which was new today. She has bilateral lower extremity edema with very significant stasis dermatitis on the left anterior tibial area. She has tightly adherent skin in her lower extremities probably secondary to cutaneous fibrosis in the area. We put her in compression last week. She comes in with the dressings reasonably saturated. 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 She has multiple areas on her arms from scratching. 12/3; the patient I saw for the first time 2 weeks ago. She has 3 small circular areas on her abdomen 2 in the right upper quadrant one on her umbilicus. We have been using silver alginate to this area. She also had an area on her right lateral malleolus and right heel and right medial malleolus. We have been using silver alginate here under compression.  She does not have an open area on the left leg today. We are going to order her stockings for the left leg. She clearly has chronic lymphedema in these areas. X-ray of the foot from 10/20 did not show any fracture or dislocation. The heel wound was noted there was no evidence of osteomyelitis She complains of generalized pruritus. 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 She has longstanding lymphedema. Although she does not have known liver disease I looked at his CT scan of the abdomen from February of this year that showed hepatic steatosis 12/11; patient still has no open area on the left leg we transitioned her into a stocking she had. Her stockings from  are supposed to be arriving later today which will be the stockings of choice. The only wound remaining on the right is the right heel 2 small superficial open areas. Everything else is well on its way to healing in the  right leg few small excoriations. She has 1 major area remaining on her abdomen the rest seem to be healing 12/22 patient still has no open area on the left leg and she is still in a stocking. She had still 2 open areas on the tip of her right heel. These look like pressure related areas although the patient is not really certain how this is happening. She has an open heeled shoe that we provided. Still has 1 open area on the right lateral abdomen The patient has complaints of generalized pruritus. She has multiple excoriated areas on her abdomen that of close over her arms or thighs which I think are from scratching. She tells me that she has never had a basic work-up for this which might include basic lab work, liver functions, kidney functions, thyroid etc. She might also benefit from a dermatologist 12/29; the patient has a deeper larger more painful wound on the tip of her right heel. Again these look like pressure ulcers although she wears an open toed heel pads or heel at night to prevent it from hitting the mattress etc. They tell me and remind me that this is been present on and off for about 2 years that has closed over but then will reopen. I did look over her lab work that was available in [place]. She does not have elevated liver function tests or creatinine. I was not able to see a TSH. This was in response to her complaints of generalized itching and a itch/scratch cycle. I also noted that she is on OxyContin and oxycodone and of course narcotics can cause generalized pruritus as a side effect Readmission: History From [place], [place] Last Week: 03/29/2021 this is a patient who presents for initial evaluation here in the clinic though I have seen her 1 time previously in 2020 this was in [place]. That was actually her initial visit therefore a heel ulceration. Subsequently that did not healing she actually saw me the 1 time in November and then subsequently saw Dr. Dellia Nims  through the end of December where she apparently was healed. 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 This is on the right. With that being said right now she has a basically region on the lateral portion of the ray site where she is applying more pressure especially as her foot seems to be starting to turn in and this has  become an increasingly significant issue for her to be honest. She does see Dr. Sharol Given and Dr. Sharol Given has had her on doxycycline for quite a bit of time here. Subsequently she is just now wrapping that out. I think that she probably would be benefited by continue the doxycycline for a time while we can work through what we need to do with things here. She does have evidence of osteomyelitis based on what I saw on the MRI today. This obviously is unfortunate and definitely not something that she was hoping to hear. With that being said I do believe that she would potentially be a strong candidate for hyperbaric oxygen therapy. Also believe she could be a candidate for total contact cast though we have to be cautious due to the fact that how her foot is bending inward I think that this is good to be a little bit of a concern. We will definitely have to keeping a close eye on things. She does have a history of diabetes mellitus type 2. She also other than the amputation has a medical history positive for hypertension. She has never undergone hyperbaric oxygen therapy previously I did show her the chamber we did talk a little bit about it today as well. Admission [facility]: o 04/06/2021 Patient presents here in [place] today for evaluation. Again she is establishing care here after I saw her last week in [place]. Obviously I think that she is doing extremely well at this point which is great news and in general I am extremely pleased with where things stand from the  standpoint of the appearance of the wound. Obviously this is not significantly smaller but does look a lot cleaner I think using the Hydrofera Blue has been of benefit over the past week. Nonetheless she still has a wound with issues with pressure here which I think is good to be an ongoing issue. Also think that the osteomyelitis which is chronic is also getting ongoing issue. She does want to try to do what she can to prevent this from worsening and ending with a below-knee amputation. T that end I do think that getting her into the hyperbaric oxygen chamber would be what we need to do. She has recently been seen by cardiology o and subsequently well as far as that is concerned. Her ejection fraction was appropriate for hyperbarics and everything seems to be doing great in that regard. She has not had a recent chest x-ray she is a former smoker around 15 years ago she quit. Nonetheless I do believe with her asthma we do want to do a chest x-ray just to make sure everything is okay before proceeding with the hyperbarics although we can go ahead and see about getting the approval. I also think she is going require some sharp debridement and around this wound we also have to contact her insurance for prior approval on this as well. 04/13/2021 upon evaluation today patient appears to be doing a little bit better in regard to her leg in fact the swelling is dramatically better. Very pleased with where things stand in that regard. Fortunately there does not appear to be any signs of active infection at this time. No fevers, chills, nausea, vomiting, or diarrhea. 04/20/2021 upon evaluation today patient appears to be doing decently well in regard to her foot. There is some need for sharp debridement here today. With that being said we will get a go ahead and proceed with that today as the foot  is becoming somewhat macerated with the overhanging callus that is definitely not we want to see. With that being  said the patient does have an issue here as well with a boil in the perineal region unfortunately that is an issue for her today as well. I did have a look at that as well. We are still working on dealing with insurance as far as getting hyperbarics approved. 04/27/2021 upon evaluation today patient appears to be doing somewhat poorly in general compared to where she has been. At this point she is having a lot of swelling which is the main concerning thing that I am seeing. There does not appear to be any signs of infection currently which is good news but at the same time I do feel like that she is a lot more swollen than she was even last week and is showing how much she is weeping in regard to the right lower leg. She also has a blister on the left breast although is not an open wound at this time. She continues to have the area in the perineum as well. 05/04/2021 upon evaluation today patient appears to actually be doing decently well in regard to her wound on the foot. Fortunately there is no signs of active infection at this time. No fevers, chills, nausea, vomiting, or diarrhea. Unfortunately she does have an area on the left breast which is actually appearing to be a burn based on what I see physically. She does note that she uses rice bags for areas in general and may have left one in that area not realizing it was [name], [name] ([id number]) [id number] Page 8 of 13 burning her as she does not really have much feeling. I think this is very probable based on what I am seeing. For that reason working to treat this as such I do think we need to loosen up a lot of the necrotic tissue here. I Am going tosend in a prescription for Santyl for the patient. 9/1; patient presents for HBO today but cannot do treatment due to wheezing and feeling short of breath when laying down. She was set up as a doctor visit today since she was not able to do HBO. She states she feels okay  overall and started having a dry cough over the past couple days. She has not tested herself for COVID. She denies fever/chills or sputum production. She thinks her symptoms are related to allergies. She has been using silver alginate to her right plantar foot wound. She has not been using Santyl to the breast wound because she reports forgetting to do this. She uses zinc oxide to her perennial wound. She currently denies systemic signs of infection. 9/8; patient presents for follow-up. She has been a unable to do HBO treatments for the past week due to her back pain. She is currently taking Flexeril to address this issue. She reports no issues with her compression wrap. She has been putting Santyl on the left breast wound and Monistat cream to the perennial wound. She denies signs of infection. 9/15; patient presents for follow-up. Again she is unable to do HBO treatment today due to sinus pain. She has 2 new wounds to her right foot which she is unaware of. She has been putting Santyl on the left breast wound and zinc oxide to the perineal wound. She currently denies signs of infection. Readmission 10/17/2021 Ms. [name] is a 61 year old female with a past medical history of type 2 diabetes, osteomyelitis  status post right BKA and morbid obesity that presents to the clinic for multiple scattered open wounds to her body. These are located to the abdomen, left posterior leg and sacral region. She has been using mupirocin ointment, Neosporin and zinc oxide to the wound beds. She has been started on Bactrim and Keflex by her primary care physician and states she has several days left to complete the course. She currently denies systemic signs of infection. READMISSION This is a 61 year old morbidly obese type II diabetic (poorly controlled with last hemoglobin A1c 9.2%). She has congestive heart failure, peripheral vascular disease, hypertension, coronary artery disease, venous stasis, connective  tissue disease (o Sjogren's syndrome), and bilateral lower extremity edema. She has been seen here for various reasons in the past. She has a right above-knee amputation. She was recently hospitalized for a left diabetic foot ulcer. Orthopedic surgery was consulted. An MRI was performed that was not conclusive for osteomyelitis, but given the extent of the necrosis, orthopedics recommended amputation. Infectious disease was also consulted and have recommended a 6-week course of oral doxycycline and Augmentin. Apparently wound care was also consulted while she was in the hospital and simply recommended painting the area with Betadine. She saw infectious disease on October 27 with plans to continue doxycycline on Augmentin to continue therapy until November 14. The infectious disease provider also recommended that the patient reconsider amputation. Her PCP referred her to the wound care center, as the patient is very reluctant to undergo amputation. She also has a small ulcer on her anterior tibial surface that recently opened after a minor trauma. On exam, her left foot is rolled such that the lateral aspect of her foot is the contact surface. There is a large ulcer with exposed necrotic muscle and fat. The wound does not probe to bone, but apparently there was bone exposure in the past while she was in the hospital. No malodor or purulent drainage. The wound on her anterior tibial surface is small with just a little slough accumulation. She has modest edema and skin changes consistent with stasis dermatitis. 07/28/2022: The anterior tibial wound is healed. The left plantar foot wound looks a little bit better today. There is significantly less necrotic tissue present. There is an area at the most caudal aspect of the wound that looks like there is ongoing pressure-induced tissue injury. Bone remains covered. 08/11/2022: The wound has deteriorated quite a bit since her last visit. Her husband reports that  she has had significantly more drainage, such that he has had to change the dressing multiple times per day. During the course of the visit, she recalled that she had not been taking her Lasix for at least 4 days. There is substantial periwound maceration and further tissue breakdown. There is necrotic tissue at the midportion of the wound and tendon is now exposed underneath this necrotic muscle. 08/17/2022: The wound looks better this week. There is still an area of necrotic muscle and tendon in the midfoot, but the forefoot has improved with some epithelium beginning to come in from the margins. Unfortunately, there is evidence of ongoing pressure-induced tissue injury near the heel. The patient does admit to resting her heel on a stool frequently. She is still taking Bactrim as prescribed and her Keystone topical antibiotic compound should arrive at their home today. 12/14; fairly large sized wound on the left plantar foot. She has a right AKA. She uses a leg only for transfers she says she does not propel her wheelchair with  that foot. She has been using Keystone, silver alginate and an Ace wrap. She has completed her Bactrim and Augmentin. She is a type II diabetic. The most worrisome part of this wound is a smaller area roughly 2 x 2 in the distal part of the wound which is 100% occupied by necrotic tendon 09/07/2022: She had an extensive debridement at her last visit. The wound is smaller today and all of the tissues appear healthier. Patient History Information obtained from Patient. Family History Diabetes - Mother, Heart Disease - Mother,Father,Siblings, Hypertension - Mother,Father,Siblings, Lung Disease - Father, Stroke - Mother, No family history of Cancer, Hereditary Spherocytosis, Kidney Disease, Seizures, Thyroid Problems, Tuberculosis. Social History Former smoker - quit 15 years ago, Marital Status - Married, Alcohol Use - Never, Drug Use - No History, Caffeine Use -  Rarely. Medical History Eyes Patient has history of Cataracts - both eyes Denies history of Glaucoma, Optic Neuritis Ear/Nose/Mouth/Throat Denies history of Chronic sinus problems/congestion, Middle ear problems Hematologic/Lymphatic Patient has history of Lymphedema Denies history of Anemia, Hemophilia, Human Immunodeficiency Virus, Sickle Cell Disease Respiratory Patient has history of Asthma Denies history of Aspiration, Chronic Obstructive Pulmonary Disease (COPD), Pneumothorax, Sleep Apnea, Tuberculosis Cardiovascular Patient has history of Congestive Heart Failure, Coronary Artery Disease, Hypertension, Peripheral Arterial Disease, Peripheral Venous Disease Denies history of Angina, Arrhythmia, Hypotension, Myocardial Infarction, Phlebitis, Vasculitis Gastrointestinal Denies history of Hepatitis A, Hepatitis B, Hepatitis C Endocrine Patient has history of Type II Diabetes Denies history of Type I Diabetes Genitourinary Denies history of End Stage Renal Disease Immunological Denies history of Lupus Erythematosus, Raynaudoos, Scleroderma Godina, Dara [name] ([id number]) [id number] Page 9 of 13 Integumentary (Skin) Denies history of History of Burn Musculoskeletal Patient has history of Rheumatoid Arthritis Denies history of Gout, Osteoarthritis, Osteomyelitis Neurologic Patient has history of Neuropathy Denies history of Dementia, Quadriplegia, Paraplegia, Seizure Disorder Oncologic Denies history of Received Chemotherapy, Received Radiation Psychiatric Denies history of Anorexia/bulimia, Confinement Anxiety Hospitalization/Surgery History - 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 Sharol Given 01/2020. 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 - 11/22 R AKA. Medical A Surgical History Notes nd Constitutional Symptoms (General Health) hypothyroidism Cardiovascular cardiac stents Gastrointestinal GERD Endocrine Hypothyroidism Immunological Mix  connective tissue disease- autoimmune Sjogren's syndrome Musculoskeletal Fibromyalgia, Scoliosis, Objective Constitutional Slightly hypertensive. No acute distress. Vitals Time Taken: 9:15 AM, Height: 62 in, Weight: 284 lbs, BMI: 51.9, Temperature: 98.0 F, Pulse: 91 bpm, Respiratory Rate: 20 breaths/min, Blood Pressure: 147/85 mmHg, Capillary Blood Glucose: 161 mg/dl. Respiratory Normal work of breathing on room air. General Notes: 09/07/2022: She had an extensive debridement at her last visit. The wound is smaller today and all of the tissues appear healthier. Integumentary (Hair, Skin) Wound #19 status is Open. Original cause of wound was Gradually Appeared. The date acquired was: 05/17/2022. The wound has been in treatment 7 weeks. The wound is located on the [place]. The wound measures 10cm length x 3.6cm width x 0.4cm depth; 28.274cm^2 area and 11.31cm^3 volume. There is tendon and Fat Layer (Subcutaneous Tissue) exposed. There is no tunneling or undermining noted. There is a large amount of serosanguineous drainage noted. The wound margin is distinct with the outline attached to the wound base. There is large (67-100%) red, pink granulation within the wound bed. There is a small (1-33%) amount of necrotic tissue within the wound bed including Eschar. The periwound skin appearance exhibited: Callus, Maceration, Erythema. The periwound skin appearance did not exhibit: Crepitus, Excoriation, Induration, Rash, Scarring, Dry/Scaly, Atrophie Blanche, Cyanosis, Ecchymosis,  Hemosiderin Staining, Mottled, Pallor, Rubor. The surrounding wound skin color is noted with erythema. Periwound temperature was noted as No Abnormality. 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 There was a Excisional Skin/Subcutaneous Tissue Debridement with a total area of 21 sq cm performed by Fredirick Maudlin, MD. With the following instrument(s): Curette to remove Non-Viable tissue/material. Material removed includes Eschar, Subcutaneous Tissue, and Slough after achieving pain control using Lidocaine 5% topical ointment. No specimens were taken. A time out was conducted at 09:32, prior to the start of the procedure. A Minimum amount of bleeding was controlled with Pressure. The procedure was tolerated well. Post Debridement Measurements: 10cm length x 3.6cm width x 0.4cm depth; 11.31cm^3 volume. Character of Wound/Ulcer Post Debridement is improved. Severity of Tissue Post Debridement is: Fat layer exposed. Post procedure Diagnosis Wound #19: Same as Pre-Procedure General Notes: scribed for Dr. Celine Ahr by [name], RN. Plan Follow-up Appointments: Return Appointment in 1 week. - Dr. Celine Ahr - room 2 Anesthetic: (In clinic) Topical Lidocaine 5% applied to wound bed - In Clinic Bathing/ Shower/ Hygiene: May shower and wash wound with soap and water. - with dressing changes Edema Control - Lymphedema / SCD / Other: Elevate legs to the level of the heart or above for 30 minutes daily and/or when sitting for 3-4 times a day throughout the day. Avoid standing for long periods of time. Off-Loading: Other: - Try and elevate Left leg throughout the day Home Health: Discontinue home health for wound care. Other Home Health Orders/Instructions: Alvis Lemmings The  following medication(s) was prescribed: lidocaine topical 5 % ointment ointment topical was prescribed at facility WOUND #19: - Foot Wound Laterality: Plantar, Left Cleanser: Soap and Water 1 x Per Day/30 Days Discharge Instructions: May shower and wash wound with dial antibacterial soap and water prior to dressing change. Cleanser: Wound Cleanser (Generic) 1 x Per Day/30 Days Discharge Instructions: Cleanse the wound with wound cleanser prior to applying a clean dressing using gauze sponges, not tissue or cotton balls. Prim Dressing: Sorbalgon AG Dressing 6x6 (in/in) (Generic) 1 x Per Day/30 Days ary Discharge Instructions: Apply to wound bed as instructed Secondary Dressing: Woven Gauze Sponge, Non-Sterile 4x4 in 1 x Per Day/30 Days Discharge Instructions: Apply over primary dressing as directed. Secondary Dressing: Zetuvit Plus 4x8 in 1 x Per Day/30 Days Discharge Instructions: Apply over primary dressing as directed. Secured With: Elastic Bandage 4 inch (ACE bandage) (Generic) 1 x Per Day/30 Days Discharge Instructions: Secure with ACE bandage as directed. Secured With: [employer], 4.5x3.1 (in/yd) (Generic) 1 x Per Day/30 Days Discharge Instructions: Secure with Kerlix as directed. Secured With: 27M Medipore Public affairs consultant Surgical T 2x10 (in/yd) (Generic) 1 x Per Day/30 Days ape Discharge Instructions: Secure with tape as directed. 09/07/2022: She had an extensive debridement at her last visit. The wound is smaller today and all of the tissues appear healthier. I used a curette to debride eschar, slough, and nonviable subcutaneous tissue from her wound. We will continue to use her [facility] topical  antibiotic compound with silver alginate. Follow-up in 1 week. Electronic Signature(s) Signed: 09/07/2022 9:42:13 AM By: Fredirick Maudlin MD FACS Entered By: Fredirick Maudlin on 09/07/2022 09:42:12 -------------------------------------------------------------------------------- HxROS  Details Patient Name: Date of Service: [name], [name] [name] 09/07/2022 9:15 A [name] Medical Record Number: [id number] Patient Account Number: [id number] Date of Birth/Sex: Treating RN: 01-25-1961 (61 y.o. F) Primary Care Provider: Riki [name] Other Clinician: Referring Provider: Treating Provider/Extender: Louann Sjogren in Treatment: 7 Information Obtained From Patient Noorani, [name] ([id number]) [id number] Page 11 of 13 Constitutional Symptoms (General Health) Medical History: Past Medical History Notes: hypothyroidism Eyes Medical History: Positive for: Cataracts - both eyes Negative for: Glaucoma; Optic Neuritis Ear/Nose/Mouth/Throat Medical History: Negative for: Chronic sinus problems/congestion; Middle ear problems Hematologic/Lymphatic Medical History: Positive for: Lymphedema Negative for: Anemia; Hemophilia; Human Immunodeficiency Virus; Sickle Cell Disease Respiratory Medical History: Positive for: Asthma Negative for: Aspiration; Chronic Obstructive Pulmonary Disease (COPD); Pneumothorax; Sleep Apnea; Tuberculosis Cardiovascular Medical History: Positive for: Congestive Heart Failure; Coronary Artery Disease; Hypertension; Peripheral Arterial Disease; Peripheral Venous Disease Negative for: Angina; Arrhythmia; Hypotension; Myocardial Infarction; Phlebitis; Vasculitis Past Medical History Notes: cardiac stents Gastrointestinal Medical History: Negative for: Hepatitis A; Hepatitis B; Hepatitis C Past Medical History Notes: GERD Endocrine Medical History: Positive for: Type II Diabetes Negative for: Type I Diabetes Past Medical History Notes: Hypothyroidism Time with diabetes: since 2002 Treated with: Insulin, Oral agents Blood sugar tested every day: Yes Tested : 2-3 times a day Genitourinary Medical History: Negative for: End Stage Renal Disease Immunological Medical History: Negative for:  Lupus Erythematosus; Raynauds; Scleroderma 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 Integumentary (Skin) Medical History: Negative for: History of Burn Musculoskeletal Medical History: Positive for: Rheumatoid Arthritis Negative for: Gout; Osteoarthritis; Osteomyelitis Past Medical History Notes: Fibromyalgia, Scoliosis, Amezcua, Andretta [name] ([id number]) [id number] Page 12 of 13 Neurologic Medical History: Positive for: Neuropathy Negative for: Dementia; Quadriplegia; Paraplegia; Seizure Disorder Oncologic Medical History: Negative for: Received Chemotherapy; Received Radiation Psychiatric Medical History: Negative for: Anorexia/bulimia; Confinement Anxiety HBO Extended History Items Eyes: Cataracts Immunizations Pneumococcal Vaccine: Received Pneumococcal Vaccination: Yes Received Pneumococcal Vaccination On or After 60th Birthday: Yes Implantable Devices None Hospitalization / Surgery History Type of Hospitalization/Surgery 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 Family and Social History Cancer: No; Diabetes: Yes - Mother; Heart Disease: Yes - Mother,Father,Siblings; Hereditary Spherocytosis: No; Hypertension: Yes - Mother,Father,Siblings; Kidney Disease: No; Lung Disease: Yes - Father; Seizures: No; Stroke: Yes - Mother; Thyroid Problems: No; Tuberculosis: No; Former smoker - quit 15 years ago; Marital Status - Married; Alcohol Use: Never; Drug Use: No History; Caffeine Use: Rarely; Financial Concerns: No; Food, Clothing or Shelter Needs: No; Support System Lacking: No; Transportation Concerns: No Electronic Signature(s) Signed: 09/07/2022 10:27:26 AM By: Fredirick Maudlin MD FACS Entered By: Fredirick Maudlin on 09/07/2022 09:40:04 -------------------------------------------------------------------------------- SuperBill  Details Patient Name: Date of Service: Armes, [name] [name] 09/07/2022 Medical Record Number: [id number] Patient Account Number: [id number] Date of Birth/Sex: Treating RN: 10-22-1960 (61 y.o. 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 M86.9 Osteomyelitis, unspecified E11.621 Type 2 diabetes mellitus with foot ulcer E66.01 Morbid (severe) obesity due to excess calories M35.00 Sjogren syndrome, unspecified I73.9 Peripheral vascular disease, unspecified Voorheis, Lucy [name] ([id number]) [id number] Page [phone]  of 13 Z61.09 Chronic diastolic (congestive) heart failure 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 Facility Procedures : 3 CPT4 Code: 6045409 Description: 81191 - DEB SUBQ TISSUE 20 SQ CM/< ICD-10 Diagnosis Description 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 Modifier: Quantity: 1 : 3 CPT4 Code: 4782956 Description: 21308 - DEB SUBQ TISS EA ADDL 20CM ICD-10 Diagnosis Description 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 Modifier: Quantity: 1 Physician Procedures : CPT4 Code Description Modifier [id number] 62952 - WC PHYS LEVEL 4 - EST PT 25 ICD-10 Diagnosis Description 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 E11.621 Type 2 diabetes mellitus with foot ulcer I73.9  Peripheral vascular disease, unspecified W41.32 Chronic diastolic (congestive) heart failure Quantity: 1 : [id number] 11042 - WC PHYS SUBQ TISS 20 SQ CM ICD-10 Diagnosis Description 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

## 2022-09-08 ENCOUNTER — Other Ambulatory Visit: Payer: Self-pay | Admitting: Family Medicine

## 2022-09-08 DIAGNOSIS — J4540 Moderate persistent asthma, uncomplicated: Secondary | ICD-10-CM

## 2022-09-15 ENCOUNTER — Encounter (HOSPITAL_BASED_OUTPATIENT_CLINIC_OR_DEPARTMENT_OTHER): Payer: Commercial Managed Care - HMO | Attending: General Surgery | Admitting: General Surgery

## 2022-09-15 DIAGNOSIS — M869 Osteomyelitis, unspecified: Secondary | ICD-10-CM | POA: Insufficient documentation

## 2022-09-15 DIAGNOSIS — E11621 Type 2 diabetes mellitus with foot ulcer: Secondary | ICD-10-CM | POA: Diagnosis present

## 2022-09-15 DIAGNOSIS — E1169 Type 2 diabetes mellitus with other specified complication: Secondary | ICD-10-CM | POA: Insufficient documentation

## 2022-09-15 DIAGNOSIS — Z89611 Acquired absence of right leg above knee: Secondary | ICD-10-CM | POA: Diagnosis not present

## 2022-09-15 DIAGNOSIS — I5032 Chronic diastolic (congestive) heart failure: Secondary | ICD-10-CM | POA: Diagnosis not present

## 2022-09-15 DIAGNOSIS — Z6841 Body Mass Index (BMI) 40.0 and over, adult: Secondary | ICD-10-CM | POA: Diagnosis not present

## 2022-09-15 DIAGNOSIS — M3500 Sicca syndrome, unspecified: Secondary | ICD-10-CM | POA: Insufficient documentation

## 2022-09-15 DIAGNOSIS — E1151 Type 2 diabetes mellitus with diabetic peripheral angiopathy without gangrene: Secondary | ICD-10-CM | POA: Diagnosis not present

## 2022-09-15 DIAGNOSIS — L97423 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: Secondary | ICD-10-CM | POA: Diagnosis not present

## 2022-09-15 DIAGNOSIS — I890 Lymphedema, not elsewhere classified: Secondary | ICD-10-CM | POA: Insufficient documentation

## 2022-09-15 NOTE — Progress Notes (Signed)
Hoselton, Zowie [name] ([id number]) [id number] Page 1 of 13 Visit Report for 09/15/2022 Chief Complaint Document Details Patient Name: Date of Service: [name], [name] [place] [name]. 09/15/2022 8:30 A [name] Medical Record Number: [id number] Patient Account Number: [id number] Date of Birth/Sex: Treating RN: [birth year] (62 y.o. F) Primary Care Provider: Arva ChafeWendling, Nicholas Other Clinician: Referring Provider: Treating Provider/Extender: Vivien Rossettiannon, Laval Cafaro Wendling, Nicholas Weeks in Treatment: 8 Information Obtained from: Patient Chief Complaint 10/17/2021; multiple scattered wounds to the abdomen, posterior left leg, left labia and sacrum 07/19/2022: left foot DFU Electronic Signature(s) Signed: 09/15/2022 8:49:07 AM By: Duanne Guessannon, Zyrell Carmean MD FACS Entered By: Duanne Guessannon, Treson Laura on 09/15/2022 08:49:07 -------------------------------------------------------------------------------- Debridement Details Patient Name: Date of Service: [name], [name] 09/15/2022 8:30 A [name] Medical Record Number: [id number] Patient Account Number: [id number] Date of Birth/Sex: Treating RN: [birth year] (62 y.o. Fredderick PhenixF) Herrington, Taylor Primary Care Provider: Arva ChafeWendling, Nicholas Other Clinician: Referring Provider: Treating Provider/Extender: Vivien Rossettiannon, Jomel Whittlesey Wendling, Nicholas Weeks in Treatment: 8 Debridement Performed for Assessment: Wound #19 Left,Plantar Foot Performed By: Physician Duanne Guessannon, Eddy Termine, MD Debridement Type: Debridement Severity of Tissue Pre Debridement: Fat layer exposed Level of Consciousness (Pre-procedure): Awake and Alert Pre-procedure Verification/Time Out Yes - 08:37 Taken: Start Time: 08:37 Pain Control: Lidocaine 4% T opical Solution T Area Debrided (L x W): otal 9 (cm) x 2 (cm) = 18 (cm) Tissue and other material debrided: Non-Viable, Slough, Subcutaneous, Tendon, Slough Level: Skin/Subcutaneous Tissue/Muscle Debridement Description: Excisional Instrument: Curette Bleeding:  Minimum Hemostasis Achieved: Pressure Response to Treatment: Procedure was tolerated well Level of Consciousness (Post- Awake and Alert procedure): Post Debridement Measurements of Total Wound Length: (cm) 10 Width: (cm) 3.6 Depth: (cm) 0.3 Volume: (cm) 8.482 Character of Wound/Ulcer Post Debridement: Improved Severity of Tissue Post Debridement: Fat layer exposed [name], [name] ([id number]) [id number] Page 2 of 13 Post Procedure Diagnosis Same as Pre-procedure Notes scribed for Dr. Lady Garyannon by Samuella Bruinaylor Herrington, RN Electronic Signature(s) Unsigned Entered By: Samuella BruinHerrington, Taylor on 09/15/2022 08:38:40 -------------------------------------------------------------------------------- HPI Details Patient Name: Date of Service: [name], [name] 09/15/2022 8:30 A [name] Medical Record Number: [id number] Patient Account Number: [id number] Date of Birth/Sex: Treating RN: [birth year] (62 y.o. F) Primary Care Provider: Arva ChafeWendling, Nicholas Other Clinician: Referring Provider: Treating Provider/Extender: Vivien Rossettiannon, Llewellyn Schoenberger Wendling, Nicholas Weeks in Treatment: 8 History of Present Illness HPI Description: 07/23/2019 on evaluation today patient presents with a myriad of wounds noted at multiple locations over her right heel, right lower extremity, and abdominal region. She also has bilateral lower extremity lymphedema which is quite significant as well. Incidentally she also has congestive heart failure and hypertension. She also is obese. With that being said I think all this is contributing as well to her lower extremity edema which is very much uncontrolled. It seems like its been at least several months since she is worn any compression according to what she tells me. With that being said I am not sure exactly when that would have been. She does have fibrotic changes in the lower extremity secondary to lymphedema worse on the left than the right. She did show  me pictures of wound she had on the anterior portion of her shin which was quite significant fortunately that has healed. These issues have been intermittent at this time. Again I think that with appropriate compression therapy she may actually be doing better than what we are seeing at this point but again I am not really sure that she is ever been extremely compliant with that. Fortunately there is no signs of active infection at this time. No fever  chills noted. As far as the abdominal ulcer she initially had one wound that she thinks may have been a bug bite. Subsequently she was put a dressing on this and she states that the tape pulled skin off on other locations causing other wounds that have not healed that is been about 3 months. The wounds on her lower extremities have been intermittent over 3 years. This includes the heel. 11/19; this is a patient that I have not seen previously. She was admitted to our clinic last week with multiple wounds including several superficial circular areas on her abdomen predominantly right upper quadrant. Also 1 in her umbilicus. She has an area on her right lateral malleolus which was new today. She has bilateral lower extremity edema with very significant stasis dermatitis on the left anterior tibial area. She has tightly adherent skin in her lower extremities probably secondary to cutaneous fibrosis in the area. We put her in compression last week. She comes in with the dressings reasonably saturated. 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 She has multiple areas on her arms from scratching. 12/3; the patient I saw for the first time 2 weeks ago. She has 3 small circular areas on her abdomen 2 in the right upper quadrant one on her umbilicus. We have been using  silver alginate to this area. She also had an area on her right lateral malleolus and right heel and right medial malleolus. We have been using silver alginate here under compression. She does not have an open area on the left leg today. We are going to order her stockings for the left leg. She clearly has chronic lymphedema in these areas. X-ray of the foot from 10/20 did not show any fracture or dislocation. The heel wound was noted there was no evidence of osteomyelitis She complains of generalized pruritus. 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 She has longstanding lymphedema. Although she does not have known liver disease I looked at his CT scan of the abdomen from February of this year that showed hepatic steatosis 12/11; patient still has no open area on the left leg we transitioned her into a stocking she had. Her stockings from [employer] are supposed to be arriving later today which will be the stockings of choice. The only wound remaining on the right is the right heel 2 small superficial open areas. Everything else is well on its way to healing in the right leg few small excoriations. She has 1 major area remaining on her abdomen the rest seem to be healing 12/22 patient still has no open area on the left leg and she is still in a stocking. She had still 2 open areas on the tip of her right heel. These look like pressure related areas although the patient is not really certain how this is happening. She has an open heeled shoe that we provided. Still has 1 open area on the right lateral abdomen The patient has complaints of generalized pruritus. She has multiple excoriated areas on her abdomen that of close over her arms or thighs which I think are from scratching. She tells me that she has never had a basic work-up for this which might include basic lab work, liver functions, kidney functions, thyroid etc. She might also benefit from a  dermatologist 12/29; the patient  has a deeper larger more painful wound on the tip of her right heel. Again these look like pressure ulcers although she wears an open toed heel pads or heel at night to prevent it from hitting the mattress etc. They tell me and remind me that this is been present on and off for about 2 years that has closed over but then will reopen. I did look over her lab work that was available in [place]. She does not have elevated liver function tests or creatinine. I was not able to see a TSH. This was in response to her complaints of generalized itching and a itch/scratch cycle. I also noted that she is on OxyContin and oxycodone and of course narcotics can cause generalized pruritus as a side effect Readmission: History From [place], [place] Last Week: 03/29/2021 this is a patient who presents for initial evaluation here in the clinic though I have seen her 1 time previously in 2020 this was in [place]. That was actually her initial visit therefore a heel ulceration. Subsequently that did not healing she actually saw me the 1 time in November and then subsequently saw Dr. Dellia Nims through the end of December where she apparently was healed. 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 This is on the right. With that being said right now she has a basically region on the Kobler, Taneya [name] ([id number]) [id number] Page 3 of 13 lateral portion of the ray site where she is applying more pressure especially as her foot seems to be starting to turn in and this has become an increasingly significant issue for her to be honest. She does see Dr. Sharol Given and Dr. Sharol Given has had her on doxycycline for quite a bit of time here. Subsequently she is just now wrapping that out. I think that she probably would be benefited by continue the doxycycline for a time  while we can work through what we need to do with things here. She does have evidence of osteomyelitis based on what I saw on the MRI today. This obviously is unfortunate and definitely not something that she was hoping to hear. With that being said I do believe that she would potentially be a strong candidate for hyperbaric oxygen therapy. Also believe she could be a candidate for total contact cast though we have to be cautious due to the fact that how her foot is bending inward I think that this is good to be a little bit of a concern. We will definitely have to keeping a close eye on things. She does have a history of diabetes mellitus type 2. She also other than the amputation has a medical history positive for hypertension. She has never undergone hyperbaric oxygen therapy previously I did show her the chamber we did talk a little bit about it today as well. Admission [facility]: o 04/06/2021 Patient presents here in [place] today for evaluation. Again she is establishing care here after I saw her last week in [place]. Obviously I think that she is doing extremely well at this point which is great news and in general I am extremely pleased with where things stand from the standpoint of the appearance of the wound. Obviously this is not significantly smaller but does look a lot cleaner I think using the Hydrofera Blue has been of benefit over the past week. Nonetheless she still has  a wound with issues with pressure here which I think is good to be an ongoing issue. Also think that the osteomyelitis which is chronic is also getting ongoing issue. She does want to try to do what she can to prevent this from worsening and ending with a below-knee amputation. T that end I do think that getting her into the hyperbaric oxygen chamber would be what we need to do. She has recently been seen by cardiology o and subsequently well as far as that is concerned. Her ejection fraction was  appropriate for hyperbarics and everything seems to be doing great in that regard. She has not had a recent chest x-ray she is a former smoker around 15 years ago she quit. Nonetheless I do believe with her asthma we do want to do a chest x-ray just to make sure everything is okay before proceeding with the hyperbarics although we can go ahead and see about getting the approval. I also think she is going require some sharp debridement and around this wound we also have to contact her insurance for prior approval on this as well. 04/13/2021 upon evaluation today patient appears to be doing a little bit better in regard to her leg in fact the swelling is dramatically better. Very pleased with where things stand in that regard. Fortunately there does not appear to be any signs of active infection at this time. No fevers, chills, nausea, vomiting, or diarrhea. 04/20/2021 upon evaluation today patient appears to be doing decently well in regard to her foot. There is some need for sharp debridement here today. With that being said we will get a go ahead and proceed with that today as the foot is becoming somewhat macerated with the overhanging callus that is definitely not we want to see. With that being said the patient does have an issue here as well with a boil in the perineal region unfortunately that is an issue for her today as well. I did have a look at that as well. We are still working on dealing with insurance as far as getting hyperbarics approved. 04/27/2021 upon evaluation today patient appears to be doing somewhat poorly in general compared to where she has been. At this point she is having a lot of swelling which is the main concerning thing that I am seeing. There does not appear to be any signs of infection currently which is good news but at the same time I do feel like that she is a lot more swollen than she was even last week and is showing how much she is weeping in regard to the right lower  leg. She also has a blister on the left breast although is not an open wound at this time. She continues to have the area in the perineum as well. 05/04/2021 upon evaluation today patient appears to actually be doing decently well in regard to her wound on the foot. Fortunately there is no signs of active infection at this time. No fevers, chills, nausea, vomiting, or diarrhea. Unfortunately she does have an area on the left breast which is actually appearing to be a burn based on what I see physically. She does note that she uses rice bags for areas in general and may have left one in that area not realizing it was burning her as she does not really have much feeling. I think this is very probable based on what I am seeing. For that reason working to treat this as such I do  think we need to loosen up a lot of the necrotic tissue here. I Am going tosend in a prescription for Santyl for the patient. 9/1; patient presents for HBO today but cannot do treatment due to wheezing and feeling short of breath when laying down. She was set up as a doctor visit today since she was not able to do HBO. She states she feels okay overall and started having a dry cough over the past couple days. She has not tested herself for COVID. She denies fever/chills or sputum production. She thinks her symptoms are related to allergies. She has been using silver alginate to her right plantar foot wound. She has not been using Santyl to the breast wound because she reports forgetting to do this. She uses zinc oxide to her perennial wound. She currently denies systemic signs of infection. 9/8; patient presents for follow-up. She has been a unable to do HBO treatments for the past week due to her back pain. She is currently taking Flexeril to address this issue. She reports no issues with her compression wrap. She has been putting Santyl on the left breast wound and Monistat cream to the perennial wound. She denies signs of  infection. 9/15; patient presents for follow-up. Again she is unable to do HBO treatment today due to sinus pain. She has 2 new wounds to her right foot which she is unaware of. She has been putting Santyl on the left breast wound and zinc oxide to the perineal wound. She currently denies signs of infection. Readmission 10/17/2021 Ms. Dalonda Simoni is a 62 year old female with a past medical history of type 2 diabetes, osteomyelitis status post right BKA and morbid obesity that presents to the clinic for multiple scattered open wounds to her body. These are located to the abdomen, left posterior leg and sacral region. She has been using mupirocin ointment, Neosporin and zinc oxide to the wound beds. She has been started on Bactrim and Keflex by her primary care physician and states she has several days left to complete the course. She currently denies systemic signs of infection. READMISSION This is a 62 year old morbidly obese type II diabetic (poorly controlled with last hemoglobin A1c 9.2%). She has congestive heart failure, peripheral vascular disease, hypertension, coronary artery disease, venous stasis, connective tissue disease (o Sjogren's syndrome), and bilateral lower extremity edema. She has been seen here for various reasons in the past. She has a right above-knee amputation. She was recently hospitalized for a left diabetic foot ulcer. Orthopedic surgery was consulted. An MRI was performed that was not conclusive for osteomyelitis, but given the extent of the necrosis, orthopedics recommended amputation. Infectious disease was also consulted and have recommended a 6-week course of oral doxycycline and Augmentin. Apparently wound care was also consulted while she was in the hospital and simply recommended painting the area with Betadine. She saw infectious disease on October 27 with plans to continue doxycycline on Augmentin to continue therapy until November 14. The infectious disease  provider also recommended that the patient reconsider amputation. Her PCP referred her to the wound care center, as the patient is very reluctant to undergo amputation. She also has a small ulcer on her anterior tibial surface that recently opened after a minor trauma. On exam, her left foot is rolled such that the lateral aspect of her foot is the contact surface. There is a large ulcer with exposed necrotic muscle and fat. The wound does not probe to bone, but apparently there was bone exposure in  the past while she was in the hospital. No malodor or purulent drainage. The wound on her anterior tibial surface is small with just a little slough accumulation. She has modest edema and skin changes consistent with stasis dermatitis. 07/28/2022: The anterior tibial wound is healed. The left plantar foot wound looks a little bit better today. There is significantly less necrotic tissue present. There is an area at the most caudal aspect of the wound that looks like there is ongoing pressure-induced tissue injury. Bone remains covered. 08/11/2022: The wound has deteriorated quite a bit since her last visit. Her husband reports that she has had significantly more drainage, such that he has had to change the dressing multiple times per day. During the course of the visit, she recalled that she had not been taking her Lasix for at least 4 days. There is substantial periwound maceration and further tissue breakdown. There is necrotic tissue at the midportion of the wound and tendon is now exposed underneath this necrotic muscle. 08/17/2022: The wound looks better this week. There is still an area of necrotic muscle and tendon in the midfoot, but the forefoot has improved with some epithelium beginning to come in from the margins. Unfortunately, there is evidence of ongoing pressure-induced tissue injury near the heel. The patient does admit to resting her heel on a stool frequently. She is still taking Bactrim as  prescribed and her Keystone topical antibiotic compound should arrive at their home today. 12/14; fairly large sized wound on the left plantar foot. She has a right AKA. She uses a leg only for transfers she says she does not propel her wheelchair with that foot. She has been using Keystone, silver alginate and an Ace wrap. She has completed her Bactrim and Augmentin. She is a type II diabetic. The most [place], Maggie [name] ([id number]) [id number] Page 4 of 13 worrisome part of this wound is a smaller area roughly 2 x 2 in the distal part of the wound which is 100% occupied by necrotic tendon 09/07/2022: She had an extensive debridement at her last visit. The wound is smaller today and all of the tissues appear healthier. 09/15/2022: The wound measurements are about the same, but overall the surface appears healthier. There is still a little bit of the nonviable tendon exposed at the midfoot. Electronic Signature(s) Signed: 09/15/2022 8:49:46 AM By: Duanne Guess MD FACS Entered By: Duanne Guess on 09/15/2022 08:49:46 -------------------------------------------------------------------------------- Physical Exam Details Patient Name: Date of Service: [name], [name] 09/15/2022 8:30 A [name] Medical Record Number: [id number] Patient Account Number: [id number] Date of Birth/Sex: Treating RN: Jan 22, 1961 (62 y.o. F) Primary Care Provider: [name] Other Clinician: Referring Provider: Treating Provider/Extender: Vivien Rossetti in Treatment: 8 Constitutional . . . . no acute distress. Respiratory Normal work of breathing on room air. Notes 09/15/2022: The wound measurements are about the same, but overall the surface appears healthier. There is still a little bit of the nonviable tendon exposed at the midfoot. Electronic Signature(s) Signed: 09/15/2022 8:50:26 AM By: Duanne Guess MD FACS Entered By: Duanne Guess on 09/15/2022  08:50:25 -------------------------------------------------------------------------------- Physician Orders Details Patient Name: Date of Service: [name], [name] 09/15/2022 8:30 A [name] Medical Record Number: [id number] Patient Account Number: [id number] Date of Birth/Sex: Treating RN: 19-Apr-1961 (63 y.o. 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 - Dr. Lady Gary - room 2 Return A Anesthetic (In clinic) Topical Lidocaine 4% applied to wound bed Bathing/ Shower/ Hygiene May shower and wash wound with soap and water. - with dressing changes Edema Control - Lymphedema / SCD / Other Elevate legs to the level of the heart or above for 30 minutes daily and/or when sitting for 3-4 times a day throughout the day. Avoid standing for long periods of time. Off-Loading Other: - Try and elevate Left leg throughout the day Wound Treatment Wound #19 - Foot Wound Laterality: Plantar, Left Cleanser: Soap and Water 1 x Per Day/30 Days Discharge Instructions: May shower and wash wound with dial antibacterial soap and water prior to dressing change. Cleanser: Wound Cleanser (Generic) 1 x Per Day/30 Days Discharge Instructions: Cleanse the wound with wound cleanser  prior to applying a clean dressing using gauze sponges, not tissue or cotton balls. Prim Dressing: Sorbalgon AG Dressing 6x6 (in/in) (Generic) 1 x Per Day/30 Days ary Discharge Instructions: Apply to wound bed as instructed Secondary Dressing: Woven Gauze Sponge, Non-Sterile 4x4 in 1 x Per Day/30 Days Discharge Instructions: Apply over primary dressing as directed. Secondary Dressing: Zetuvit Plus 4x8 in 1 x Per Day/30 Days Discharge Instructions: Apply over primary dressing as directed. Secured With: Elastic Bandage 4 inch (ACE bandage) (Generic) 1 x Per Day/30 Days Discharge Instructions: Secure with ACE bandage as directed. Secured With: [employer], 4.5x3.1 (in/yd) (Generic) 1 x Per Day/30 Days Discharge Instructions: Secure with Kerlix as directed. Secured With: 25M Medipore Scientist, research (life sciences) Surgical T 2x10 (in/yd) (Generic) 1 x Per Day/30 Days ape Discharge Instructions: Secure with tape as directed. Patient Medications llergies: peanut, morphine, gabapentin, iodine, Victoza, Pepto-Bismol, fish oil, Fish Containing Products, Crestor, lovastatin, metronidazole, Red Yeast Rice A ([place]ascus Purpureus), rotigotine, Atrovent, Suprep Bowel Prep Kit, adhesive tape, Betadine, Dial soap, Suprep Bowel Prep Kit Notifications Medication Indication Start End 09/15/2022 lidocaine DOSE topical 4 % cream - cream topical Electronic Signature(s) Unsigned Entered By: Duanne Guess on 09/15/2022 08:51:07 -------------------------------------------------------------------------------- Problem List Details Patient Name: Date of Service: [name], [name] 09/15/2022 8:30 A [name] Medical Record Number: [id number] Patient Account Number: [id number] Date of Birth/Sex: Treating RN: Feb 04, 1961 (62 y.o. 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 07/19/2022 No Yes M86.9 Osteomyelitis, unspecified 07/19/2022 No Yes E11.621 Type 2 diabetes mellitus with foot ulcer 07/19/2022 No Yes E66.01 Morbid (severe) obesity due to excess calories 07/19/2022 No Yes M35.00 Sjogren syndrome, unspecified 07/19/2022 No Yes I73.9 Peripheral vascular disease, unspecified 07/19/2022 No Yes I50.32 Chronic diastolic (congestive) heart failure 07/19/2022 No Yes 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 09/15/2022 8:30 A [name] Medical Record Number: [id number] Patient Account Number: [id number] Date of Birth/Sex: Treating RN: 24-Dec-1960 (62 y.o. F) Primary Care Provider: [name] Other Clinician: Referring Provider: Treating Provider/Extender: Louann Sjogren in Treatment: 8 Subjective Chief Complaint Information obtained from Patient [name], [name] ([id number]) [id number] Page 7 of 13 10/17/2021; multiple scattered wounds to the abdomen, posterior left leg, left labia and sacrum 07/19/2022: left foot DFU History of Present Illness (HPI) 07/23/2019 on evaluation today patient presents with a myriad of wounds noted at  multiple locations over her right heel, right lower extremity, and abdominal region. She also has bilateral lower extremity lymphedema which is quite significant as well. Incidentally she also has congestive heart failure and hypertension. She also is obese. With that being said I think all this is contributing as well to her lower extremity edema which is very much uncontrolled. It seems like its been at least several months since she is worn any compression according to what she tells me. With that being said I am not sure exactly when that would have been. She does have fibrotic changes in the lower extremity secondary to lymphedema worse on the left than the right. She did show me pictures of wound she had on the anterior portion of her shin which was quite significant fortunately that has healed. These issues have been intermittent at this time. Again I think that with appropriate compression therapy she may actually be doing better than what we are seeing at this point but again I am not really sure that she is ever been extremely compliant with that. Fortunately there is no signs of active infection at this time. No fever chills noted. As far as the abdominal ulcer she initially had one wound that she thinks may have been a bug bite. Subsequently she was put a dressing on this and she states that the tape pulled skin off on other locations causing other wounds that have not healed that is been about 3 months. The wounds on her lower extremities have been intermittent over 3 years. This includes the heel. 11/19; this is a patient that I have not seen previously. She was admitted to our clinic last week with multiple wounds including several superficial circular areas on her abdomen predominantly right upper quadrant. Also 1 in her umbilicus. She has an area on her right lateral malleolus which was new today. She has bilateral lower extremity edema with very significant stasis dermatitis on the  left anterior tibial area. She has tightly adherent skin in her lower extremities probably secondary to cutaneous fibrosis in the area. We put her in compression last week. She comes in with the dressings reasonably saturated. 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 She has multiple areas on her arms from scratching. 12/3; the patient I saw for the first time 2 weeks ago. She has 3 small circular areas on her abdomen 2 in the right upper quadrant one on her umbilicus. We have been using silver alginate to this area. She also had an area on her right lateral malleolus and right heel and right medial malleolus. We have been using  silver alginate here under compression. She does not have an open area on the left leg today. We are going to order her stockings for the left leg. She clearly has chronic lymphedema in these areas. X-ray of the foot from 10/20 did not show any fracture or dislocation. The heel wound was noted there was no evidence of osteomyelitis She complains of generalized pruritus. 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 She has longstanding lymphedema. Although she does not have known liver disease I looked at his CT scan of the abdomen from February of this year that showed hepatic steatosis 12/11; patient still has no open area on the left leg we transitioned her into a stocking she had. Her stockings from [employer] are supposed to be arriving later today which will be the stockings of choice. The only wound remaining on the right is the right heel 2 small superficial open areas. Everything else is well on its way to healing in the right leg few small excoriations. She has 1 major area remaining on her abdomen the rest seem to be healing 12/22 patient  still has no open area on the left leg and she is still in a stocking. She had still 2 open areas on the tip of her right heel. These look like pressure related areas although the patient is not really certain how this is happening. She has an open heeled shoe that we provided. Still has 1 open area on the right lateral abdomen The patient has complaints of generalized pruritus. She has multiple excoriated areas on her abdomen that of close over her arms or thighs which I think are from scratching. She tells me that she has never had a basic work-up for this which might include basic lab work, liver functions, kidney functions, thyroid etc. She might also benefit from a dermatologist 12/29; the patient has a deeper larger more painful wound on the tip of her right heel. Again these look like pressure ulcers although she wears an open toed heel pads or heel at night to prevent it from hitting the mattress etc. They tell me and remind me that this is been present on and off for about 2 years that has closed over but then will reopen. I did look over her lab work that was available in [facility]. She does not have elevated liver function tests or creatinine. I was not able to see a TSH. This was in response to her complaints of generalized itching and a itch/scratch cycle. I also noted that she is on OxyContin and oxycodone and of course narcotics can cause generalized pruritus as a side effect Readmission: History From [place], [place] Last Week: 03/29/2021 this is a patient who presents for initial evaluation here in the clinic though I have seen her 1 time previously in 2020 this was in [place]. That was actually her initial visit therefore a heel ulceration. Subsequently that did not healing she actually saw me the 1 time in November and then subsequently saw Dr. Leanord Hawking through the end of December where she apparently was healed. Since I last seen her she actually did have an amputation which  is basically a fourth and fifth ray amputation of the foot which is in question today. This is on the right. With that being said right now she has a basically region on the lateral portion of the ray site where she is applying more pressure especially as her foot seems to be starting to  turn in and this has become an increasingly significant issue for her to be honest. She does see Dr. Lajoyce Corners and Dr. Lajoyce Corners has had her on doxycycline for quite a bit of time here. Subsequently she is just now wrapping that out. I think that she probably would be benefited by continue the doxycycline for a time while we can work through what we need to do with things here. She does have evidence of osteomyelitis based on what I saw on the MRI today. This obviously is unfortunate and definitely not something that she was hoping to hear. With that being said I do believe that she would potentially be a strong candidate for hyperbaric oxygen therapy. Also believe she could be a candidate for total contact cast though we have to be cautious due to the fact that how her foot is bending inward I think that this is good to be a little bit of a concern. We will definitely have to keeping a close eye on things. She does have a history of diabetes mellitus type 2. She also other than the amputation has a medical history positive for hypertension. She has never undergone hyperbaric oxygen therapy previously I did show her the chamber we did talk a little bit about it today as well. Admission T [facility]: o 04/06/2021 Patient presents here in [place] [place] [facility] today for evaluation. Again she is establishing care here after I saw her last week in [place]. Obviously I think that she is doing extremely well at this point which is great news and in general I am extremely pleased with where things stand from the standpoint of the appearance of the wound. Obviously this is not significantly smaller but does look a lot cleaner I think  using the Hydrofera Blue has been of benefit over the past week. Nonetheless she still has a wound with issues with pressure here which I think is good to be an ongoing issue. Also think that the osteomyelitis which is chronic is also getting ongoing issue. She does want to try to do what she can to prevent this from worsening and ending with a below-knee amputation. T that end I do think that getting her into the hyperbaric oxygen chamber would be what we need to do. She has recently been seen by cardiology o and subsequently well as far as that is concerned. Her ejection fraction was appropriate for hyperbarics and everything seems to be doing great in that regard. She has not had a recent chest x-ray she is a former smoker around 15 years ago she quit. Nonetheless I do believe with her asthma we do want to do a chest x-ray just to make sure everything is okay before proceeding with the hyperbarics although we can go ahead and see about getting the approval. I also think she is going require some sharp debridement and around this wound we also have to contact her insurance for prior approval on this as well. 04/13/2021 upon evaluation today patient appears to be doing a little bit better in regard to her leg in fact the swelling is dramatically better. Very pleased with where things stand in that regard. Fortunately there does not appear to be any signs of active infection at this time. No fevers, chills, nausea, vomiting, or diarrhea. 04/20/2021 upon evaluation today patient appears to be doing decently well in regard to her foot. There is some need for sharp debridement here today. With that being said we will get a go ahead and proceed with  that today as the foot is becoming somewhat macerated with the overhanging callus that is definitely not we want to see. With that being said the patient does have an issue here as well with a boil in the perineal region unfortunately that is an issue for her  today as well. I did have a look at that as well. We are still working on dealing with insurance as far as getting hyperbarics approved. 04/27/2021 upon evaluation today patient appears to be doing somewhat poorly in general compared to where she has been. At this point she is having a lot of swelling which is the main concerning thing that I am seeing. There does not appear to be any signs of infection currently which is good news but at the same time I do feel like that she is a lot more swollen than she was even last week and is showing how much she is weeping in regard to the right lower leg. She also has a blister on the left breast although is not an open wound at this time. She continues to have the area in the perineum as well. 05/04/2021 upon evaluation today patient appears to actually be doing decently well in regard to her wound on the foot. Fortunately there is no signs of active Talsma, Falana [name] ([id number]) [id number] Page 8 of 13 infection at this time. No fevers, chills, nausea, vomiting, or diarrhea. Unfortunately she does have an area on the left breast which is actually appearing to be a burn based on what I see physically. She does note that she uses rice bags for areas in general and may have left one in that area not realizing it was burning her as she does not really have much feeling. I think this is very probable based on what I am seeing. For that reason working to treat this as such I do think we need to loosen up a lot of the necrotic tissue here. I Am going tosend in a prescription for Santyl for the patient. 9/1; patient presents for HBO today but cannot do treatment due to wheezing and feeling short of breath when laying down. She was set up as a doctor visit today since she was not able to do HBO. She states she feels okay overall and started having a dry cough over the past couple days. She has not tested herself for COVID. She denies  fever/chills or sputum production. She thinks her symptoms are related to allergies. She has been using silver alginate to her right plantar foot wound. She has not been using Santyl to the breast wound because she reports forgetting to do this. She uses zinc oxide to her perennial wound. She currently denies systemic signs of infection. 9/8; patient presents for follow-up. She has been a unable to do HBO treatments for the past week due to her back pain. She is currently taking Flexeril to address this issue. She reports no issues with her compression wrap. She has been putting Santyl on the left breast wound and Monistat cream to the perennial wound. She denies signs of infection. 9/15; patient presents for follow-up. Again she is unable to do HBO treatment today due to sinus pain. She has 2 new wounds to her right foot which she is unaware of. She has been putting Santyl on the left breast wound and zinc oxide to the perineal wound. She currently denies signs of infection. Readmission 10/17/2021 Ms. Aireonna Bauer is a 62 year old female with a past medical history  of type 2 diabetes, osteomyelitis status post right BKA and morbid obesity that presents to the clinic for multiple scattered open wounds to her body. These are located to the abdomen, left posterior leg and sacral region. She has been using mupirocin ointment, Neosporin and zinc oxide to the wound beds. She has been started on Bactrim and Keflex by her primary care physician and states she has several days left to complete the course. She currently denies systemic signs of infection. READMISSION This is a 62 year old morbidly obese type II diabetic (poorly controlled with last hemoglobin A1c 9.2%). She has congestive heart failure, peripheral vascular disease, hypertension, coronary artery disease, venous stasis, connective tissue disease (o Sjogren's syndrome), and bilateral lower extremity edema. She has been seen here for various reasons  in the past. She has a right above-knee amputation. She was recently hospitalized for a left diabetic foot ulcer. Orthopedic surgery was consulted. An MRI was performed that was not conclusive for osteomyelitis, but given the extent of the necrosis, orthopedics recommended amputation. Infectious disease was also consulted and have recommended a 6-week course of oral doxycycline and Augmentin. Apparently wound care was also consulted while she was in the hospital and simply recommended painting the area with Betadine. She saw infectious disease on October 27 with plans to continue doxycycline on Augmentin to continue therapy until November 14. The infectious disease provider also recommended that the patient reconsider amputation. Her PCP referred her to the wound care center, as the patient is very reluctant to undergo amputation. She also has a small ulcer on her anterior tibial surface that recently opened after a minor trauma. On exam, her left foot is rolled such that the lateral aspect of her foot is the contact surface. There is a large ulcer with exposed necrotic muscle and fat. The wound does not probe to bone, but apparently there was bone exposure in the past while she was in the hospital. No malodor or purulent drainage. The wound on her anterior tibial surface is small with just a little slough accumulation. She has modest edema and skin changes consistent with stasis dermatitis. 07/28/2022: The anterior tibial wound is healed. The left plantar foot wound looks a little bit better today. There is significantly less necrotic tissue present. There is an area at the most caudal aspect of the wound that looks like there is ongoing pressure-induced tissue injury. Bone remains covered. 08/11/2022: The wound has deteriorated quite a bit since her last visit. Her husband reports that she has had significantly more drainage, such that he has had to change the dressing multiple times per day. During  the course of the visit, she recalled that she had not been taking her Lasix for at least 4 days. There is substantial periwound maceration and further tissue breakdown. There is necrotic tissue at the midportion of the wound and tendon is now exposed underneath this necrotic muscle. 08/17/2022: The wound looks better this week. There is still an area of necrotic muscle and tendon in the midfoot, but the forefoot has improved with some epithelium beginning to come in from the margins. Unfortunately, there is evidence of ongoing pressure-induced tissue injury near the heel. The patient does admit to resting her heel on a stool frequently. She is still taking Bactrim as prescribed and her Keystone topical antibiotic compound should arrive at their home today. 12/14; fairly large sized wound on the left plantar foot. She has a right AKA. She uses a leg only for transfers she says she does  not propel her wheelchair with that foot. She has been using Keystone, silver alginate and an Ace wrap. She has completed her Bactrim and Augmentin. She is a type II diabetic. The most worrisome part of this wound is a smaller area roughly 2 x 2 in the distal part of the wound which is 100% occupied by necrotic tendon 09/07/2022: She had an extensive debridement at her last visit. The wound is smaller today and all of the tissues appear healthier. 09/15/2022: The wound measurements are about the same, but overall the surface appears healthier. There is still a little bit of the nonviable tendon exposed at the midfoot. Patient History Information obtained from Patient. Family History Diabetes - Mother, Heart Disease - Mother,Father,Siblings, Hypertension - Mother,Father,Siblings, Lung Disease - Father, Stroke - Mother, No family history of Cancer, Hereditary Spherocytosis, Kidney Disease, Seizures, Thyroid Problems, Tuberculosis. Social History Former smoker - quit 15 years ago, Marital Status - Married, Alcohol Use -  Never, Drug Use - No History, Caffeine Use - Rarely. Medical History Eyes Patient has history of Cataracts - both eyes Denies history of Glaucoma, Optic Neuritis Ear/Nose/Mouth/Throat Denies history of Chronic sinus problems/congestion, Middle ear problems Hematologic/Lymphatic Patient has history of Lymphedema Denies history of Anemia, Hemophilia, Human Immunodeficiency Virus, Sickle Cell Disease Respiratory Patient has history of Asthma Denies history of Aspiration, Chronic Obstructive Pulmonary Disease (COPD), Pneumothorax, Sleep Apnea, Tuberculosis Cardiovascular Patient has history of Congestive Heart Failure, Coronary Artery Disease, Hypertension, Peripheral Arterial Disease, Peripheral Venous Disease Denies history of Angina, Arrhythmia, Hypotension, Myocardial Infarction, Phlebitis, Vasculitis Gastrointestinal Denies history of Hepatitis A, Hepatitis B, Hepatitis C Endocrine Patient has history of Type II Diabetes Maher, Serena [name] ([id number]) [id number] Page 9 of 13 Denies history of Type I Diabetes Genitourinary Denies history of End Stage Renal Disease Immunological Denies history of Lupus Erythematosus, Raynaudoos, Scleroderma Integumentary (Skin) Denies history of History of Burn Musculoskeletal Patient has history of Rheumatoid Arthritis Denies history of Gout, Osteoarthritis, Osteomyelitis Neurologic Patient has history of Neuropathy Denies history of Dementia, Quadriplegia, Paraplegia, Seizure Disorder Oncologic Denies history of Received Chemotherapy, Received Radiation Psychiatric Denies history of Anorexia/bulimia, Confinement Anxiety Hospitalization/Surgery History - 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 Lajoyce Cornersuda 01/2020. 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 - 11/22 R AKA. Medical A Surgical History Notes nd Constitutional Symptoms (General Health) hypothyroidism Cardiovascular cardiac  stents Gastrointestinal GERD Endocrine Hypothyroidism Immunological Mix connective tissue disease- autoimmune Sjogren's syndrome Musculoskeletal Fibromyalgia, Scoliosis, Objective Constitutional no acute distress. Vitals Time Taken: 8:12 AM, Height: 62 in, Weight: 284 lbs, BMI: 51.9, Temperature: 98.0 F, Pulse: 96 bpm, Respiratory Rate: 20 breaths/min, Blood Pressure: 134/71 mmHg, Capillary Blood Glucose: 175 mg/dl. Respiratory Normal work of breathing on room air. General Notes: 09/15/2022: The wound measurements are about the same, but overall the surface appears healthier. There is still a little bit of the nonviable tendon exposed at the midfoot. Integumentary (Hair, Skin) Wound #19 status is Open. Original cause of wound was Gradually Appeared. The date acquired was: 05/17/2022. The wound has been in treatment 8 weeks. The wound is located on the Left,Plantar Foot. The wound measures 10cm length x 3.6cm width x 0.3cm depth; 28.274cm^2 area and 8.482cm^3 volume. There is tendon and Fat Layer (Subcutaneous Tissue) exposed. There is no tunneling or undermining noted. There is a medium amount of serosanguineous drainage noted. The wound margin is distinct with the outline attached to the wound base. There is large (67-100%) red, pink granulation within the wound bed. There is a  small (1-33%) amount of necrotic tissue within the wound bed including Adherent Slough. The periwound skin appearance exhibited: Callus, Dry/Scaly, Erythema. The periwound skin appearance did not exhibit: Crepitus, Excoriation, Induration, Rash, Scarring, Maceration, Atrophie Blanche, Cyanosis, Ecchymosis, Hemosiderin Staining, Mottled, Pallor, Rubor. The surrounding wound skin color is noted with erythema. Periwound temperature was noted as No Abnormality. 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 There was a Excisional Skin/Subcutaneous Tissue/Muscle Debridement with a total area of 18 sq cm performed by Duanne Guessannon, Zafiro Routson, MD. With the following instrument(s): Curette to remove Non-Viable tissue/material. Material removed includes Tendon, Subcutaneous Tissue, and Slough after achieving pain control using Lidocaine 4% Topical Solution. No specimens were taken. A time out was conducted at 08:37, prior to the start of the procedure. A Minimum amount of bleeding was controlled with Pressure. The procedure was tolerated well. Post Debridement Measurements: 10cm length x 3.6cm width x 0.3cm depth; 8.482cm^3 volume. Character of Wound/Ulcer Post Debridement is improved. Severity of Tissue Post Debridement is: Fat layer exposed. Post procedure Diagnosis Wound #19: Same as Pre-Procedure General Notes: scribed for Dr. Lady Garyannon by Samuella Bruinaylor Herrington, RN. Plan Follow-up Appointments: Return Appointment in 1 week. - Dr. Lady Garyannon - room 2 Anesthetic: (In clinic) Topical Lidocaine 4% applied to wound bed Bathing/ Shower/ Hygiene: May shower and wash wound with soap and water. - with dressing changes Edema Control - Lymphedema / SCD / Other: Elevate legs to the level of the heart or above for 30 minutes daily and/or when sitting for 3-4 times a day throughout the day. Avoid standing for long periods of time. Off-Loading: Other: - Try and elevate Left leg throughout the day The following medication(s)  was prescribed: lidocaine topical 4 % cream cream topical was prescribed at facility WOUND #19: - Foot Wound Laterality: Plantar, Left Cleanser: Soap and Water 1 x Per Day/30 Days Discharge Instructions: May shower and wash wound with dial antibacterial soap and water prior to dressing change. Cleanser: Wound Cleanser (Generic) 1 x Per Day/30 Days Discharge Instructions: Cleanse the wound with wound cleanser prior to applying a clean dressing using gauze sponges, not tissue or cotton balls. Prim Dressing: Sorbalgon AG Dressing 6x6 (in/in) (Generic) 1 x Per Day/30 Days ary Discharge Instructions: Apply to wound bed as instructed Secondary Dressing: Woven Gauze Sponge, Non-Sterile 4x4 in 1 x Per Day/30 Days Discharge Instructions: Apply over primary dressing as directed. Secondary Dressing: Zetuvit Plus 4x8 in 1 x Per Day/30 Days Discharge Instructions: Apply over primary dressing as directed. Secured With: Elastic Bandage 4 inch (ACE bandage) (Generic) 1 x Per Day/30 Days Discharge Instructions: Secure with ACE bandage as directed. Secured With: [employer] Group[employer]erlix Roll Sterile, 4.5x3.1 (in/yd) (Generic) 1 x Per Day/30 Days Discharge Instructions: Secure with Kerlix as directed. Secured With: 50M Medipore Scientist, research (life sciences)oft Cloth Surgical T 2x10 (in/yd) (Generic) 1 x Per Day/30 Days ape Discharge Instructions: Secure with tape as directed. 09/15/2022: The wound measurements are about the same, but overall the surface appears healthier. There is still a little bit of the nonviable tendon  exposed at the midfoot. I used a curette to debride the nonviable tendon, along with slough and nonviable subcutaneous tissue from the wound. We will continue to use her Keystone topical antimicrobial compound (she will need a refill) with silver alginate, Kerlix, and an Ace bandage. Follow-up in 1 week. Electronic Signature(s) Signed: 09/15/2022 8:51:47 AM By: Duanne Guess MD FACS Entered By: Duanne Guess on 09/15/2022  08:51:46 -------------------------------------------------------------------------------- HxROS Details Patient Name: Date of Service: [name], [name] 09/15/2022 8:30 A [name] Medical Record Number: [id number] Patient Account Number: [id number] Date of Birth/Sex: Treating RN: [birth year] (62 y.o. F) Primary Care Provider: [name] Other Clinician: Referring Provider: Treating Provider/Extender: Vivien Rossetti in Treatment: 8 [name], [name] ([id number]) [id number] Page 11 of 13 Information Obtained From Patient Constitutional Symptoms (General Health) Medical History: Past Medical History Notes: hypothyroidism Eyes Medical History: Positive for: Cataracts - both eyes Negative for: Glaucoma; Optic Neuritis Ear/Nose/Mouth/Throat Medical History: Negative for: Chronic sinus problems/congestion; Middle ear problems Hematologic/Lymphatic Medical History: Positive for: Lymphedema Negative for: Anemia; Hemophilia; Human Immunodeficiency Virus; Sickle Cell Disease Respiratory Medical History: Positive for: Asthma Negative for: Aspiration; Chronic Obstructive Pulmonary Disease (COPD); Pneumothorax; Sleep Apnea; Tuberculosis Cardiovascular Medical History: Positive for: Congestive Heart Failure; Coronary Artery Disease; Hypertension; Peripheral Arterial Disease; Peripheral Venous Disease Negative for: Angina; Arrhythmia; Hypotension; Myocardial Infarction; Phlebitis; Vasculitis Past Medical History Notes: cardiac stents Gastrointestinal Medical History: Negative for: Hepatitis A; Hepatitis B; Hepatitis C Past Medical History Notes: GERD Endocrine Medical History: Positive for: Type II Diabetes Negative for: Type I Diabetes Past Medical History Notes: Hypothyroidism Time with diabetes: since 2002 Treated with: Insulin, Oral agents Blood sugar tested every day: Yes Tested : 2-3 times a day Genitourinary Medical  History: Negative for: End Stage Renal Disease Immunological Medical History: Negative for: Lupus Erythematosus; Raynauds; Scleroderma 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 Integumentary (Skin) Medical History: Negative for: History of Burn Musculoskeletal Medical History: Positive for: Rheumatoid Arthritis Hengst, Kennedy [name] ([id number]) [id number] Page 12 of 13 Negative for: Gout; Osteoarthritis; Osteomyelitis Past Medical History Notes: Fibromyalgia, Scoliosis, Neurologic Medical History: Positive for: Neuropathy Negative for: Dementia; Quadriplegia; Paraplegia; Seizure Disorder Oncologic Medical History: Negative for: Received Chemotherapy; Received Radiation Psychiatric Medical History: Negative for: Anorexia/bulimia; Confinement Anxiety HBO Extended History Items Eyes: Cataracts Immunizations Pneumococcal Vaccine: Received Pneumococcal Vaccination: Yes Received Pneumococcal Vaccination On or After 60th Birthday: Yes Implantable Devices None Hospitalization / Surgery History Type of Hospitalization/Surgery 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 Family and Social History Cancer: No; Diabetes: Yes - Mother; Heart Disease: Yes - Mother,Father,Siblings; Hereditary Spherocytosis: No; Hypertension: Yes - Mother,Father,Siblings; Kidney Disease: No; Lung Disease: Yes - Father; Seizures: No; Stroke: Yes - Mother; Thyroid Problems: No; Tuberculosis: No; Former smoker - quit 15 years ago; Marital Status - Married; Alcohol Use: Never; Drug Use: No History; Caffeine Use: Rarely; Financial Concerns: No; Food, Clothing or Shelter Needs: No; Support System Lacking: No; Transportation Concerns: No Electronic Signature(s) Unsigned Entered By: Duanne Guess on 09/15/2022  08:49:53 -------------------------------------------------------------------------------- SuperBill Details Patient Name: Date of Service: Petrow, [name] 09/15/2022 Medical Record Number: [id number] Patient Account Number: [id number] Date of Birth/Sex: Treating RN: 1961-02-20 (62 y.o. 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 Pickert, Rowyn  Judie Petit ([id number]) [id number] Page 13 of 13 M86.9 Osteomyelitis, unspecified E11.621 Type 2 diabetes mellitus with foot ulcer E66.01 Morbid (severe) obesity due to excess calories M35.00 Sjogren syndrome, unspecified I73.9 Peripheral vascular disease, unspecified I50.32 Chronic diastolic (congestive) heart failure 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 Facility Procedures : 3 CPT4 Code: 9509326 Description: 11043 - DEB MUSC/FASCIA 20 SQ CM/< ICD-10 Diagnosis Description 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 Modifier: Quantity: 1 Physician Procedures : CPT4 Code Description Modifier [id number] - WC PHYS LEVEL 4 - EST PT 25 ICD-10 Diagnosis Description 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

## 2022-09-16 NOTE — Progress Notes (Signed)
Mcduffee, Pavielle [name] ([id number]) [id number] Page 1 of 7 Visit Report for 09/15/2022 Arrival Information Details Patient Name: Date of Service: [name], [name]. 09/15/2022 8:30 A [name] Medical Record Number: [id number] Patient Account Number: [id number] Date of Birth/Sex: Treating RN: [birth year] (62 y.o. F) Primary Care [name]: [name] Other Clinician: Referring [name]: Treating [name]/Extender: [name] in Treatment: 8 Visit Information History Since Last Visit All ordered tests and consults were completed: No Patient Arrived: Wheel Chair Added or deleted any medications: No Arrival Time: 08:12 Any new allergies or adverse reactions: No Accompanied By: husband Had a fall or experienced change in No Transfer Assistance: Manual activities of daily living that may affect Patient Identification Verified: Yes risk of falls: Secondary Verification Process Completed: Yes Signs or symptoms of abuse/neglect since last visito No Patient Requires Transmission-Based Precautions: No Hospitalized since last visit: No Patient Has Alerts: No Implantable device outside of the clinic excluding No cellular tissue based products placed in the center since last visit: Pain Present Now: No Electronic Signature(s) Signed: 09/15/2022 3:48:53 PM By: Samuella [name] Entered By: Samuella [name] on 09/15/2022 08:43:21 -------------------------------------------------------------------------------- Encounter Discharge Information Details Patient Name: Date of Service: [name], [name] 09/15/2022 8:30 A [name] Medical Record Number: [id number] Patient Account Number: [id number] Date of Birth/Sex: Treating RN: [birth year] (62 y.o. [name] Primary Care Alliana Mcauliff: [name] Other Clinician: Referring [name]: Treating [name]/Extender: [name] in Treatment: 8 Encounter Discharge  Information Items Post Procedure Vitals Discharge Condition: Stable Temperature (F): 98 Ambulatory Status: Wheelchair Pulse (bpm): 96 Discharge Destination: Home Respiratory Rate (breaths/min): 20 Transportation: Private Auto Blood Pressure (mmHg): 134/71 Accompanied By: husband Schedule Follow-up Appointment: Yes Clinical Summary of Care: Patient Declined Electronic Signature(s) Signed: 09/15/2022 3:48:53 PM By: Samuella [name] Entered By: Samuella [name] on 09/15/2022 08:43:13 [name], [name] ([id number]) [id number] Page 2 of 7 -------------------------------------------------------------------------------- Lower Extremity Assessment Details Patient Name: Date of Service: [name], [name] 09/15/2022 8:30 A [name] Medical Record Number: [id number] Patient Account Number: [id number] Date of Birth/Sex: Treating RN: Jan 21, 1961 (62 y.o. [name] Primary Care [name]: [name] Other Clinician: Referring [name]: Treating [name]/Extender: [name] in Treatment: 8 Edema Assessment Assessed: [Left: No] [Right: No] Edema: [Left: Ye] [Right: s] Calf Left: Right: Point of Measurement: 30 cm From Medial Instep 45 cm Ankle Left: Right: Point of Measurement: 9 cm From Medial Instep 26 cm Electronic Signature(s) Signed: 09/15/2022 3:48:53 PM By: Samuella [name] Entered By: Samuella [name] on 09/15/2022 08:24:13 -------------------------------------------------------------------------------- Multi Wound Chart Details Patient Name: Date of Service: [name], [name] 09/15/2022 8:30 A [name] Medical Record Number: [id number] Patient Account Number: [id number] Date of Birth/Sex: Treating RN: 03-06-1961 (62 y.o. F) Primary Care [name]: [name] Other Clinician: Referring [name]: Treating [name]/Extender: [name] in Treatment: 8 Vital Signs Height(in):  62 Capillary Blood Glucose(mg/dl): 371 Weight(lbs): 062 Pulse(bpm): 96 Body Mass Index(BMI): 51.9 Blood Pressure(mmHg): 134/71 Temperature(F): 98.0 Respiratory Rate(breaths/min): 20 [19:Photos:] [N/A:N/A] Left, Plantar Foot N/A N/A Wound Location: Gradually Appeared N/A N/A Wounding Event: Diabetic Wound/Ulcer of the Lower N/A N/A Primary Etiology: Extremity Cataracts, Lymphedema, Asthma, N/A N/A Comorbid History: Congestive Heart Failure, Coronary [name], Talar [name] ([id number]) [id number] Page 3 of 7 Artery Disease, Hypertension, Peripheral Arterial Disease, Peripheral Venous Disease, Type II Diabetes, Rheumatoid Arthritis, Neuropathy 05/17/2022 N/A N/A Date Acquired: 8 N/A N/A Weeks of Treatment: Open N/A N/A Wound Status: No N/A N/A Wound Recurrence: 10x3.6x0.3 N/A N/A Measurements L x W x  D (cm) 28.274 N/A N/A A (cm) : rea 8.482 N/A N/A Volume (cm) : -25.90% N/A N/A % Reduction in A rea: 5.60% N/A N/A % Reduction in Volume: Grade 2 N/A N/A Classification: Medium N/A N/A Exudate A mount: Serosanguineous N/A N/A Exudate Type: red, brown N/A N/A Exudate Color: Distinct, outline attached N/A N/A Wound Margin: Large (67-100%) N/A N/A Granulation A mount: Red, Pink N/A N/A Granulation Quality: Small (1-33%) N/A N/A Necrotic A mount: Fat Layer (Subcutaneous Tissue): Yes N/A N/A Exposed Structures: Tendon: Yes Fascia: No Muscle: No Joint: No Bone: No Small (1-33%) N/A N/A Epithelialization: Debridement - Excisional N/A N/A Debridement: Pre-procedure Verification/Time Out 08:37 N/A N/A Taken: Lidocaine 4% Topical Solution N/A N/A Pain Control: Tendon, Subcutaneous, Slough N/A N/A Tissue Debrided: Skin/Subcutaneous Tissue/Muscle N/A N/A Level: 18 N/A N/A Debridement A (sq cm): rea Curette N/A N/A Instrument: Minimum N/A N/A Bleeding: Pressure N/A N/A Hemostasis A chieved: Procedure was tolerated well N/A  N/A Debridement Treatment Response: 10x3.6x0.3 N/A N/A Post Debridement Measurements L x W x D (cm) 8.482 N/A N/A Post Debridement Volume: (cm) Callus: Yes N/A N/A Periwound Skin Texture: Excoriation: No Induration: No Crepitus: No Rash: No Scarring: No Dry/Scaly: Yes N/A N/A Periwound Skin Moisture: Maceration: No Erythema: Yes N/A N/A Periwound Skin Color: Atrophie Blanche: No Cyanosis: No Ecchymosis: No Hemosiderin Staining: No Mottled: No Pallor: No Rubor: No No Abnormality N/A N/A Temperature: Debridement N/A N/A Procedures Performed: Treatment Notes Wound #19 (Foot) Wound Laterality: Plantar, Left Cleanser Soap and Water Discharge Instruction: May shower and wash wound with dial antibacterial soap and water prior to dressing change. Wound Cleanser Discharge Instruction: Cleanse the wound with wound cleanser prior to applying a clean dressing using gauze sponges, not tissue or cotton balls. Peri-Wound Care Topical Primary Dressing Sorbalgon AG Dressing 6x6 (in/in) Discharge Instruction: Apply to wound bed as instructed Secondary Dressing Woven Gauze Sponge, Non-Sterile 4x4 in Discharge Instruction: Apply over primary dressing as directed. Zetuvit Plus 4x8 in Wan, Cheri [name] ([id number]) [id number] Page 4 of 7 Discharge Instruction: Apply over primary dressing as directed. Secured With Elastic Bandage 4 inch (ACE bandage) Discharge Instruction: Secure with ACE bandage as directed. Kerlix Roll Sterile, 4.5x3.1 (in/yd) Discharge Instruction: Secure with Kerlix as directed. 56M Medipore Soft Cloth Surgical T 2x10 (in/yd) ape Discharge Instruction: Secure with tape as directed. Compression Wrap Compression Stockings Add-Ons Electronic Signature(s) Signed: 09/15/2022 8:48:59 AM By: Duanne Guess MD FACS Previous Signature: 09/15/2022 8:27:01 AM Version By: Duanne Guess MD FACS Entered By: Duanne Guess on 09/15/2022  08:48:59 -------------------------------------------------------------------------------- Multi-Disciplinary Care Plan Details Patient Name: Date of Service: [name], [name] 09/15/2022 8:30 A [name] Medical Record Number: [id number] Patient Account Number: [id number] Date of Birth/Sex: Treating RN: 12/27/1960 (62 y.o. [name] Primary Care Dayana Dalporto: [name] Other Clinician: Referring Jaysen Wey: Treating Tailer Volkert/Extender: [name] in Treatment: 8 Active Inactive Venous Leg Ulcer Nursing Diagnoses: Potential for venous Insuffiency (use before diagnosis confirmed) Goals: Patient will maintain optimal edema control Date Initiated: 07/19/2022 Target Resolution Date: 10/05/2022 Goal Status: Active Interventions: Assess peripheral edema status every visit. Treatment Activities: Therapeutic compression applied : 07/19/2022 Notes: Electronic Signature(s) Signed: 09/15/2022 3:48:53 PM By: Samuella [name] Entered By: Samuella [name] on 09/15/2022 08:36:44 Pain Assessment Details -------------------------------------------------------------------------------- Tiegs, Miachel Roux ([id number]) [id number] Page 5 of 7 Patient Name: Date of Service: [name], [name]. 09/15/2022 8:30 A [name] Medical Record Number: [id number] Patient Account Number: [id number] Date of Birth/Sex: Treating RN: 28-Nov-1960 (62 y.o. F) Primary Care Josemanuel Eakins: Carmelia Roller,  Hart Carwin Other Clinician: Referring Demarquez Ciolek: Treating Amore Grater/Extender: Louann Sjogren in Treatment: 8 Active Problems Location of Pain Severity and Description of Pain Patient Has Paino Yes Site Locations Rate the pain. Current Pain Level: 6 Worst Pain Level: 10 Least Pain Level: 0 Tolerable Pain Level: 3 Pain Management and Medication Current Pain Management: Electronic Signature(s) Signed: 09/15/2022 10:36:23 AM By: [name] Entered By:  [name] on 09/15/2022 08:13:24 -------------------------------------------------------------------------------- Patient/Caregiver Education Details Patient Name: Date of Service: [name], [name] 1/5/2024andnbsp8:30 A [name] Medical Record Number: [id number] Patient Account Number: [id number] Date of Birth/Gender: Treating RN: 25-Feb-1961 (62 y.o. Harlow Ohms Primary Care Physician: [name] Other Clinician: Referring Physician: Treating Physician/Extender: Louann Sjogren in Treatment: 8 Education Assessment Education Provided To: Patient Education Topics Provided Wound/Skin Impairment: Methods: Explain/Verbal Responses: Reinforcements needed, State content correctly Electronic Signature(s) Signed: 09/15/2022 3:48:53 PM By: [name] Entered By: [name] on 09/15/2022 08:37:09 Townsel, Devanee [name] ([id number]) [id number] Page 6 of 7 -------------------------------------------------------------------------------- Wound Assessment Details Patient Name: Date of Service: [name], [name] 09/15/2022 8:30 A [name] Medical Record Number: [id number] Patient Account Number: [id number] Date of Birth/Sex: Treating RN: November 15, 1960 (62 y.o. F) Primary Care Teresa Lemmerman: [name] Other Clinician: Referring Dakwon Wenberg: Treating Shervin Cypert/Extender: Louann Sjogren in Treatment: 8 Wound Status Wound Number: 19 Primary Diabetic Wound/Ulcer of the Lower Extremity Etiology: Wound Location: Left, Plantar Foot Wound Open Wounding Event: Gradually Appeared Status: Date Acquired: 05/17/2022 Comorbid Cataracts, Lymphedema, Asthma, Congestive Heart Failure, Weeks Of Treatment: 8 History: Coronary Artery Disease, Hypertension, Peripheral Arterial Disease, Clustered Wound: No Peripheral Venous Disease, Type II Diabetes, Rheumatoid Arthritis, Neuropathy Photos Wound Measurements Length: (cm)  10 Width: (cm) 3.6 Depth: (cm) 0.3 Area: (cm) 28. Volume: (cm) 8.4 % Reduction in Area: -25.9% % Reduction in Volume: 5.6% Epithelialization: Small (1-33%) 274 Tunneling: No 82 Undermining: No Wound Description Classification: Grade 2 Wound Margin: Distinct, outline attached Exudate Amount: Medium Exudate Type: Serosanguineous Exudate Color: red, brown Foul Odor After Cleansing: No Slough/Fibrino Yes Wound Bed Granulation Amount: Large (67-100%) Exposed Structure Granulation Quality: Red, Pink Fascia Exposed: No Necrotic Amount: Small (1-33%) Fat Layer (Subcutaneous Tissue) Exposed: Yes Necrotic Quality: Adherent Slough Tendon Exposed: Yes Muscle Exposed: No Joint Exposed: No Bone Exposed: No Periwound Skin Texture Texture Color No Abnormalities Noted: No No Abnormalities Noted: No Callus: Yes Atrophie Blanche: No Crepitus: No Cyanosis: No Excoriation: No Ecchymosis: No Induration: No Erythema: Yes Rash: No Hemosiderin Staining: No Scarring: No Mottled: No Pallor: No Moisture Rubor: No No Abnormalities Noted: No Dry / Scaly: Yes Temperature / Pain Maceration: No Temperature: No Abnormality [name], [name] ([id number]) [id number] Page 7 of 7 Treatment Notes Wound #19 (Foot) Wound Laterality: Plantar, Left Cleanser Soap and Water Discharge Instruction: May shower and wash wound with dial antibacterial soap and water prior to dressing change. Wound Cleanser Discharge Instruction: Cleanse the wound with wound cleanser prior to applying a clean dressing using gauze sponges, not tissue or cotton balls. Peri-Wound Care Topical Primary Dressing Sorbalgon AG Dressing 6x6 (in/in) Discharge Instruction: Apply to wound bed as instructed Secondary Dressing Woven Gauze Sponge, Non-Sterile 4x4 in Discharge Instruction: Apply over primary dressing as directed. Zetuvit Plus 4x8 in Discharge Instruction: Apply over primary dressing as  directed. Secured With Elastic Bandage 4 inch (ACE bandage) Discharge Instruction: Secure with ACE bandage as directed. Kerlix Roll Sterile, 4.5x3.1 (in/yd) Discharge Instruction: Secure with Kerlix as directed. 69M Medipore Soft Cloth Surgical T 2x10 (in/yd) ape Discharge Instruction: Secure  with tape as directed. Compression Wrap Compression Stockings Add-Ons Electronic Signature(s) Signed: 09/15/2022 3:48:53 PM By: Samuella [name] Entered By: Samuella [name] on 09/15/2022 08:26:11 -------------------------------------------------------------------------------- Vitals Details Patient Name: Date of Service: [name], [name] 09/15/2022 8:30 A [name] Medical Record Number: [id number] Patient Account Number: [id number] Date of Birth/Sex: Treating RN: March 17, 1961 (61 y.o. F) Primary Care Lorrene Graef: [name] Other Clinician: Referring Daisey Caloca: Treating Markia Kyer/Extender: [name] in Treatment: 8 Vital Signs Time Taken: 08:12 Temperature (F): 98.0 Height (in): 62 Pulse (bpm): 96 Weight (lbs): 284 Respiratory Rate (breaths/min): 20 Body Mass Index (BMI): 51.9 Blood Pressure (mmHg): 134/71 Capillary Blood Glucose (mg/dl): 628 Reference Range: 80 - 120 mg / dl Electronic Signature(s) Signed: 09/15/2022 10:36:23 AM By: Dayton Scrape Entered By: Dayton Scrape on 09/15/2022 08:12:58

## 2022-09-21 ENCOUNTER — Ambulatory Visit (HOSPITAL_BASED_OUTPATIENT_CLINIC_OR_DEPARTMENT_OTHER): Payer: Commercial Managed Care - HMO | Admitting: General Surgery

## 2022-09-22 ENCOUNTER — Encounter: Payer: Self-pay | Admitting: Registered Nurse

## 2022-09-22 ENCOUNTER — Encounter: Payer: Commercial Managed Care - HMO | Attending: Registered Nurse | Admitting: Registered Nurse

## 2022-09-22 VITALS — BP 114/58 | HR 93 | Ht 62.0 in

## 2022-09-22 DIAGNOSIS — M5416 Radiculopathy, lumbar region: Secondary | ICD-10-CM | POA: Diagnosis present

## 2022-09-22 DIAGNOSIS — Z79899 Other long term (current) drug therapy: Secondary | ICD-10-CM | POA: Diagnosis present

## 2022-09-22 DIAGNOSIS — G8929 Other chronic pain: Secondary | ICD-10-CM | POA: Diagnosis present

## 2022-09-22 DIAGNOSIS — G894 Chronic pain syndrome: Secondary | ICD-10-CM | POA: Diagnosis not present

## 2022-09-22 DIAGNOSIS — M546 Pain in thoracic spine: Secondary | ICD-10-CM | POA: Insufficient documentation

## 2022-09-22 DIAGNOSIS — M25511 Pain in right shoulder: Secondary | ICD-10-CM | POA: Insufficient documentation

## 2022-09-22 DIAGNOSIS — M542 Cervicalgia: Secondary | ICD-10-CM | POA: Insufficient documentation

## 2022-09-22 DIAGNOSIS — Z5181 Encounter for therapeutic drug level monitoring: Secondary | ICD-10-CM | POA: Diagnosis not present

## 2022-09-22 DIAGNOSIS — Z89611 Acquired absence of right leg above knee: Secondary | ICD-10-CM | POA: Insufficient documentation

## 2022-09-22 DIAGNOSIS — M25512 Pain in left shoulder: Secondary | ICD-10-CM | POA: Insufficient documentation

## 2022-09-22 DIAGNOSIS — M2550 Pain in unspecified joint: Secondary | ICD-10-CM | POA: Diagnosis present

## 2022-09-22 MED ORDER — OXYCODONE HCL 20 MG PO TABS: 1.0000 | ORAL_TABLET | Freq: Three times a day (TID) | ORAL | 0 refills | Status: DC | PRN

## 2022-09-22 NOTE — Progress Notes (Signed)
Subjective:    Patient ID: [name], female    DOB: 11-Jan-1961, 62 y.o.   MRN: [id number]  HPI: [name] is a 62 y.o. female who returns for follow up appointment for chronic pain and medication refill. She states her pain is located in her .neck, bilateral shoulder pain, mid- lower back pain and left foot pain. She rates her pain 6. Her current exercise regime is performing stretching exercises, she is currently non weight bearing per wound care she and her husband states. .  [name] Morphine equivalent is 102.00 MME.   Last Oral Swab was Performed on 06/27/2022, it was consistent.    Pain Inventory Average Pain 7 Pain Right Now 6 My pain is sharp, dull, stabbing, and aching  In the last 24 hours, has pain interfered with the following? General activity 9 Relation with others 9 Enjoyment of life 10 What TIME of day is your pain at its worst? morning  and evening Sleep (in general) Fair  Pain is worse with: standing and some activites Pain improves with: rest, heat/ice, and medication Relief from Meds: 6  Family History  Problem Relation Age of Onset   CAD Mother    Hypertension Mother    Heart attack Mother    Stroke Mother    CAD Father    Heart attack Father    Allergic rhinitis Father    Asthma Father    Hypertension Brother    Hypertension Brother    Pancreatic cancer Paternal Aunt    Breast cancer Paternal Aunt    Lung cancer Paternal Aunt    Allergic rhinitis Son    Asthma Son    Social History   Socioeconomic History   Marital status: Married    Spouse name: [name]   Number of children: 1   Years of education: 11   Highest education level: GED or equivalent  Occupational History    Comment: Not currrently working ( last worked 2004)  Tobacco Use   Smoking status: Former    Packs/day: 1.00    Years: 19.00    Total pack years: 19.00    Types: Cigarettes    Start date: 32    Quit date: 02/11/2004    Years since quitting: 18.6     Passive exposure: Past   Smokeless tobacco: Never  Vaping Use   Vaping Use: Never used  Substance and Sexual Activity   Alcohol use: Yes    Comment: RARE Wine   Drug use: Not Currently    Types: Marijuana    Comment: "only in my teens"   Sexual activity: Yes    Birth control/protection: Surgical    Comment: Hysterectomy  Other Topics Concern   Not on file  Social History Narrative   Not on file   Social Determinants of Health   Financial Resource Strain: Low Risk  (12/27/2021)   Overall Financial Resource Strain (CARDIA)    Difficulty of Paying Living Expenses: Not hard at all  Food Insecurity: No Food Insecurity (06/14/2022)   Hunger Vital Sign    Worried About Running Out of Food in the Last Year: Never true    Ran Out of Food in the Last Year: Never true  Transportation Needs: No Transportation Needs (06/14/2022)   PRAPARE - Administrator, Civil Service (Medical): No    Lack of Transportation (Non-Medical): No  Physical Activity: Inactive (08/25/2021)   Exercise Vital Sign    Days of Exercise per Week:  0 days    Minutes of Exercise per Session: 0 min  Stress: Stress Concern Present (08/25/2021)   [employer] of Occupational Health - Occupational Stress Questionnaire    Feeling of Stress : Very much  Social Connections: Moderately Integrated (08/25/2021)   Social Connection and Isolation Panel [NHANES]    Frequency of Communication with Friends and Family: More than three times a week    Frequency of Social Gatherings with Friends and Family: More than three times a week    Attends Religious Services: More than 4 times per year    Active Member of [employer] or Organizations: No    Attends Banker Meetings: Never    Marital Status: Married   Past Surgical History:  Procedure Laterality Date   ABDOMINAL HYSTERECTOMY  06/2005   "w/right ovariy"   AMPUTATION Right 01/28/2020    RIGHT FOURTH AND FIFTH RAY AMPUTATION   AMPUTATION Right  01/28/2020   Procedure: RIGHT FOURTH AND FIFTH RAY AMPUTATION,;  Surgeon: [name] Mustard, MD;  [facility];  Service: Orthopedics;  Laterality: Right;   AMPUTATION Right 06/01/2021   Procedure: RIGHT BELOW KNEE AMPUTATION;  Surgeon: [name] Mustard, MD;  Location: [facility] OR;  Service: Orthopedics;  Laterality: Right;   AMPUTATION Right 07/22/2021   Procedure: RIGHT ABOVE KNEE AMPUTATION;  Surgeon: [name] Mustard, MD;  Location: [facility] OR;  Service: Orthopedics;  Laterality: Right;   APPENDECTOMY     BREAST BIOPSY Bilateral    9 total (04/18/2018)   CORONARY ANGIOPLASTY WITH STENT PLACEMENT  10/01/2015   normal LM, 30% LAD, 85% mid RCA and 95% distal RCA s/p PCI of the mid to distal RCA and now on DAPT with ASA and Ticagrelor.     CORONARY STENT INTERVENTION Right 04/18/2018   Procedure: CORONARY STENT INTERVENTION;  Surgeon: [name] Hazel, MD;  [facility];  Service: Cardiovascular;  Laterality: Right;   DILATION AND CURETTAGE OF UTERUS     FRACTURE SURGERY     LAPAROSCOPIC CHOLECYSTECTOMY     LEFT HEART CATH AND CORONARY ANGIOGRAPHY N/A 04/18/2018   Procedure: LEFT HEART CATH AND CORONARY ANGIOGRAPHY;  Surgeon: [name] Hazel, MD;  [facility];  Service: Cardiovascular;  Laterality: N/A;   MUSCLE BIOPSY Left    "leg"   OOPHORECTOMY  07/2010   RADIOLOGY WITH ANESTHESIA N/A 05/25/2016   Procedure: RADIOLOGY WITH ANESTHESIA;  Surgeon: Medication Radiologist, MD;  [facility];  Service: Radiology;  Laterality: N/A;   STUMP REVISION Right 06/29/2021   Procedure: REVISION RIGHT BELOW KNEE AMPUTATION;  Surgeon: [name] Mustard, MD;  Location[facility] OR;  Service: Orthopedics;  Laterality: Right;   TEE WITHOUT CARDIOVERSION N/A 06/01/2021   Procedure: TRANSESOPHAGEAL ECHOCARDIOGRAM (TEE);  Surgeon: [name] Rives, MD;  Location: [facility] OR;  Service: Cardiovascular;  Laterality: N/A;   TONSILLECTOMY AND ADENOIDECTOMY     WRIST FRACTURE SURGERY Left     "crushed it"   Past Surgical History:  Procedure Laterality Date   ABDOMINAL HYSTERECTOMY  06/2005   "w/right ovariy"   AMPUTATION Right 01/28/2020    RIGHT FOURTH AND FIFTH RAY AMPUTATION   AMPUTATION Right 01/28/2020   Procedure: RIGHT FOURTH AND FIFTH RAY AMPUTATION,;  Surgeon: [name] Mustard, MD;  [facility];  Service: Orthopedics;  Laterality: Right;   AMPUTATION Right 06/01/2021   Procedure: RIGHT BELOW KNEE AMPUTATION;  Surgeon: [name] Mustard, MD;  Location: [facility] OR;  Service: Orthopedics;  Laterality: Right;  AMPUTATION Right 07/22/2021   Procedure: RIGHT ABOVE KNEE AMPUTATION;  Surgeon: Newt Minion, MD;  Location: [place];  Service: Orthopedics;  Laterality: Right;   APPENDECTOMY     BREAST BIOPSY Bilateral    9 total (04/18/2018)   CORONARY ANGIOPLASTY WITH STENT PLACEMENT  10/01/2015   normal LM, 30% LAD, 85% mid RCA and 95% distal RCA s/p PCI of the mid to distal RCA and now on DAPT with ASA and Ticagrelor.     CORONARY STENT INTERVENTION Right 04/18/2018   Procedure: CORONARY STENT INTERVENTION;  Surgeon: Burnell Blanks, MD;  Location: [place] [facility];  Service: Cardiovascular;  Laterality: Right;   DILATION AND CURETTAGE OF UTERUS     FRACTURE SURGERY     LAPAROSCOPIC CHOLECYSTECTOMY     LEFT HEART CATH AND CORONARY ANGIOGRAPHY N/A 04/18/2018   Procedure: LEFT HEART CATH AND CORONARY ANGIOGRAPHY;  Surgeon: Burnell Blanks, MD;  Location: [place] [facility];  Service: Cardiovascular;  Laterality: N/A;   MUSCLE BIOPSY Left    "leg"   OOPHORECTOMY  07/2010   RADIOLOGY WITH ANESTHESIA N/A 05/25/2016   Procedure: RADIOLOGY WITH ANESTHESIA;  Surgeon: Medication Radiologist, MD;  Location: [place];  Service: Radiology;  Laterality: N/A;   STUMP REVISION Right 06/29/2021   Procedure: REVISION RIGHT BELOW KNEE AMPUTATION;  Surgeon: Newt Minion, MD;  Location: [place];  Service: Orthopedics;  Laterality: Right;   TEE WITHOUT CARDIOVERSION N/A 06/01/2021    Procedure: TRANSESOPHAGEAL ECHOCARDIOGRAM (TEE);  Surgeon: Geralynn Rile, MD;  Location: [place];  Service: Cardiovascular;  Laterality: N/A;   TONSILLECTOMY AND ADENOIDECTOMY     WRIST FRACTURE SURGERY Left    "crushed it"   Past Medical History:  Diagnosis Date   Aortic stenosis    mild AS by echo 02/2021   Asthma    "on daily RX and rescue inhaler" (04/18/2018)   Chronic diastolic CHF (congestive heart failure) ([place]) 09/2015   Chronic lower back pain    Chronic neck pain    Chronic pain syndrome    Fentanyl Patch   Colon polyps    Coronary artery disease    cath with normal LM, 30% LAD, 85% mid RCA and 95% distal RCA s/p PCI of the mid to distal RCA and now on DAPT with ASA and Ticagrelor.     Eczema    Excessive daytime sleepiness 11/26/2015   Fibromyalgia    Gallstones    GERD (gastroesophageal reflux disease)    Heart murmur    "noted for the 1st time on 04/18/2018"   History of blood transfusion 07/2010   "S/P oophorectomy"   History of gout    History of hiatal hernia 1980s   "gone now" (04/18/2018)   Hyperlipidemia    Hypertension    takes Metoprolol and Enalapril daily   Hypothyroidism    takes Synthroid daily   IBS (irritable bowel syndrome)    Migraine    "nothing in the 2000s" (04/18/2018)   Mixed connective tissue disease ([place])    NAFLD (nonalcoholic fatty liver disease)    Pneumonia    "several times" (04/18/2018)   PVC's (premature ventricular contractions)    noted on event monitor 02/2021   Rheumatoid arthritis ([place])    "hands, elbows, shoulders, probably knees" (04/18/2018)   Scoliosis    Sjogren's syndrome (HCC)    Sleep apnea    mild - does not use cpap   Spondylosis    Type II diabetes mellitus ([place])  takes Metformin and Hum R daily (04/18/2018)   Walker as ambulation aid    also uses wheelchair   There were no vitals taken for this visit.  Opioid Risk Score:   Fall Risk Score:  `1  Depression screen [facility] 2/9     08/22/2022   11:21 AM  07/07/2022    9:16 AM 06/27/2022   11:39 AM 05/02/2022   11:56 AM 03/30/2022   11:08 AM 02/27/2022    1:37 PM 01/31/2022   10:18 AM  Depression screen PHQ 2/9  Decreased Interest 1 0 1 1 1 1  0  Down, Depressed, Hopeless 1 1 1 1 1 1  0  PHQ - 2 Score 2 1 2 2 2 2  0      Review of Systems  Musculoskeletal:  Positive for back pain, gait problem and neck pain.       B/L shoulder pain  Left knee pain Left foot pain   All other systems reviewed and are negative.     Objective:   Physical Exam Vitals and nursing note reviewed.  Constitutional:      Appearance: Normal appearance.  Cardiovascular:     Rate and Rhythm: Normal rate and regular rhythm.     Pulses: Normal pulses.     Heart sounds: Normal heart sounds.  Pulmonary:     Effort: Pulmonary effort is normal.     Breath sounds: Normal breath sounds.  Musculoskeletal:     Cervical back: Normal range of motion and neck supple.     Comments: Decreased ROM: 45 Degrees and Muscle Strength 5/5 Bilateral AC Joint Tenderness Thoracic Paraspinal Tenderness: T-7-T-9 Lumbar Hypersensitivity Bilateral Greater Trochanter Tenderness Lower Extremities: Right AKA Left Lower Extremity: Decreased ROM and Muscle Strength 4/5 Left Lower Extremity Dressing Intact: Wound Care Following.  Left Lower Extremity Flexion Produces Pain into Left Patella Wearing Left Post- Op- Shoe Arrived in wheelchair  Skin:    General: Skin is warm and dry.  Neurological:     Mental Status: She is alert and oriented to person, place, and time.  Psychiatric:        Mood and Affect: Mood normal.        Behavior: Behavior normal.         Assessment & Plan:  HX: of Right AKA: Dr Sharol Given Following. Continue to Monitor. 09/22/2022 Diabetic Polyneuropathy associated with Type 2 DM: Continue current medication regimen with Lyrica. Continue to Monitor. 09/22/2022 DM Type 2: PCP Following. Continue current medication regimen. Continue to Monitor. 09/22/2022 Chronic   Pain Syndrome : Refilled: Oxycodone 20 mg one tablet every 8 hours as needed for pain #90.  We will continue the opioid monitoring program, this consists of regular clinic visits, examinations, urine drug screen, pill counts as well as use of [place] Controlled Substance Reporting system. A 12 month History has been reviewed on the [place] Controlled Substance Reporting System on 09/22/2022 Stage 1 Decubitus: Left Buttock and Left Lower Extremity: Continue Adequate Nutrition and Weight Shifting.  Continue to Monitor. 09/22/2022  6. Cellulitis: Ms. Guedes was hospitalized on 06/14/2022- 06/20/2022, discharge summary was reviewed.  Continue to Monitpr. PCP Following.09/22/2022 7. Phantom Pain: Continue Gabapentin  Continue to Monitor. 09/22/2022 8. Polyarthralgia: Continue HEP as Tolerated. Continue to Monitor. 09/22/2022 9. Chronic Bilateral Shoulder Pain: Continue HEP as tolerated. Continue to Monitor.09/22/2022  10. Chronic Bilateral Lower Back Pain without Sciatica: Continue Lidocaine Patches 12 hours on and 12 hours off, she verbalizes understanding. Continue HEP as tolerated. Continue current medication  regimen. Continue to Monitor. 09/22/2022 11. Greater Trochanter Bursitis: Continue to Alternate Ice and Heat Therapy. Continue to Monitor. 09/22/2022 12. Left Foot Ulcer: Husband performing dressing changes. Wound Care Following and Dr Sharol Given Following. Continue to Monitor. 09/22/2022

## 2022-09-25 NOTE — Progress Notes (Deleted)
Office Visit Note  Patient: [name]             Date of Birth: February 18, 1961           MRN: [id number]             PCP: Shelda Pal, DO Referring: Shelda Pal* Visit Date: 10/09/2022 Occupation: '@GUAROCC'$ @  Subjective:  No chief complaint on file.   History of Present Illness: [name] is a 62 y.o. female ***     Activities of Daily Living:  Patient reports morning stiffness for *** {minute/hour:19697}.   Patient {ACTIONS;DENIES/REPORTS:21021675::"Denies"} nocturnal pain.  Difficulty dressing/grooming: {ACTIONS;DENIES/REPORTS:21021675::"Denies"} Difficulty climbing stairs: {ACTIONS;DENIES/REPORTS:21021675::"Denies"} Difficulty getting out of chair: {ACTIONS;DENIES/REPORTS:21021675::"Denies"} Difficulty using hands for taps, buttons, cutlery, and/or writing: {ACTIONS;DENIES/REPORTS:21021675::"Denies"}  No Rheumatology ROS completed.   PMFS History:  Patient Active Problem List   Diagnosis Date Noted   Osteomyelitis ([place]) 07/07/2022   Medication management 07/07/2022   Diabetic ulcer of left midfoot associated with diabetes mellitus due to underlying condition, with necrosis of muscle ([place])    Diabetic foot infection ([place]) 06/14/2022   Sacral decubitus ulcer, stage II ([place]) 12/24/2021   Class 3 obesity ([place]) 12/23/2021   Iron deficiency anemia 12/23/2021   Acute on chronic diastolic CHF (congestive heart failure) ([place]) 12/22/2021   Hyperkalemia 12/22/2021   Left leg cellulitis 12/20/2021   Peripheral vascular disease, unspecified ([place]) 10/10/2021   Dehiscence of amputation stump (HCC)    Acute blood loss anemia 06/24/2021   Postoperative wound infection 06/24/2021   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 ([place])    Bacteremia due to Enterococcus 05/28/2021   Digestive disorder 05/18/2021   Bilateral lower extremity edema 05/02/2021   Statin myopathy 03/04/2021   Aortic stenosis 03/04/2021   Sjogren's syndrome  ([place]) 02/11/2020   Cutaneous abscess of right foot    Statin intolerance 08/16/2019   Gram-negative bacteremia 10/18/2018   Streptococcal bacteremia 10/18/2018   CAD S/P percutaneous coronary angioplasty 04/19/2018   Essential hypertension    Moderate persistent asthma 03/21/2018   NAFLD (nonalcoholic fatty liver disease)    Recurrent cellulitis of lower extremity 01/02/2018   Cellulitis of lower leg 01/02/2018   Chronic diarrhea 05/08/2017   H/O Clostridium difficile infection 05/08/2017   Rectal bleeding 05/08/2017   Hyperbilirubinemia 04/29/2016   Hyponatremia 04/29/2016   Cellulitis of right foot 03/09/2016   Allergic rhinitis due to pollen 02/15/2016   Anaphylactic reaction due to food 02/10/2016   Type 2 diabetes mellitus with hyperglycemia, with long-term current use of insulin ([place]) 02/10/2016   Gastroesophageal reflux disease without esophagitis 02/10/2016   Atopic eczema 02/10/2016   Cervical nerve root disorder 12/15/2015   Lumbar radiculopathy 12/15/2015   Daytime somnolence 11/26/2015   Venous stasis dermatitis of both lower extremities 02/11/2015   Morbid obesity ([place]) 06/19/2014   Abnormal LFTs 03/26/2014   B-complex deficiency 03/26/2014   Benign essential HTN 03/26/2014   Chronic pain associated with significant psychosocial dysfunction 03/26/2014   Diaphragmatic hernia 03/26/2014   Gastroesophageal reflux disease 03/26/2014   Hammer toe 03/26/2014   H/O neoplasm 03/26/2014   Adaptive colitis 03/26/2014   Deafness, sensorineural 03/26/2014   Fibromyalgia 01/20/2014   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/20/2014   Hyperlipidemia 11/11/2012   Mixed connective tissue disease ([place]) 11/11/2012   Hypothyroidism 99991111   Uncomplicated asthma 99991111   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[place]) 02/20/2012   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     Eczema    Excessive daytime sleepiness 11/26/2015   Fibromyalgia    Gallstones    GERD (gastroesophageal reflux disease)    Heart murmur    "noted for the 1st time on 04/18/2018"   History of blood transfusion 07/2010   "S/P oophorectomy"   History of gout    History of hiatal hernia 1980s   "gone now" (04/18/2018)   Hyperlipidemia    Hypertension    takes Metoprolol and Enalapril daily   Hypothyroidism    takes Synthroid daily   IBS (irritable bowel syndrome)    Migraine    "nothing in the 2000s" (04/18/2018)   Mixed connective tissue disease ([place])    NAFLD (nonalcoholic fatty liver disease)    Pneumonia    "several times" (04/18/2018)   PVC's (premature ventricular contractions)    noted on event monitor 02/2021   Rheumatoid arthritis ([place])    "hands, elbows, shoulders, probably knees" (04/18/2018)   Scoliosis    Sjogren's syndrome ([place])    Sleep apnea    mild - does not use cpap   Spondylosis    Type II diabetes mellitus ([place])    takes Metformin and Hum R daily (04/18/2018)   Walker as ambulation aid    also uses wheelchair    Family History  Problem Relation Age of Onset   CAD Mother    Hypertension Mother    Heart attack Mother    Stroke Mother    CAD Father    Heart attack Father    Allergic rhinitis Father    Asthma Father    Hypertension Brother    Hypertension Brother    Pancreatic cancer Paternal Aunt    Breast cancer Paternal Aunt    Lung cancer Paternal Aunt    Allergic rhinitis Son    Asthma  Son    Past Surgical History:  Procedure Laterality Date   ABDOMINAL HYSTERECTOMY  06/2005   "w/right ovariy"   AMPUTATION Right 01/28/2020    RIGHT FOURTH AND FIFTH RAY AMPUTATION   AMPUTATION Right 01/28/2020   Procedure: RIGHT FOURTH AND FIFTH RAY AMPUTATION,;  Surgeon: Newt Minion, MD;  Location: [place];  Service: Orthopedics;  Laterality: Right;   AMPUTATION Right 06/01/2021   Procedure: RIGHT BELOW KNEE AMPUTATION;  Surgeon: Newt Minion, MD;  Location: [place];  Service: Orthopedics;  Laterality: Right;   AMPUTATION Right 07/22/2021   Procedure: RIGHT ABOVE KNEE AMPUTATION;  Surgeon: Newt Minion, MD;  Location: [place];  Service: Orthopedics;  Laterality: Right;   APPENDECTOMY     BREAST BIOPSY Bilateral    9 total (04/18/2018)   CORONARY ANGIOPLASTY WITH STENT PLACEMENT  10/01/2015   normal LM, 30% LAD, 85% mid RCA and 95% distal RCA s/p PCI of the mid to distal RCA and now on DAPT  with ASA and Ticagrelor.     CORONARY STENT INTERVENTION Right 04/18/2018   Procedure: CORONARY STENT INTERVENTION;  Surgeon: Burnell Blanks, MD;  Location: [place] [facility];  Service: Cardiovascular;  Laterality: Right;   DILATION AND CURETTAGE OF UTERUS     FRACTURE SURGERY     LAPAROSCOPIC CHOLECYSTECTOMY     LEFT HEART CATH AND CORONARY ANGIOGRAPHY N/A 04/18/2018   Procedure: LEFT HEART CATH AND CORONARY ANGIOGRAPHY;  Surgeon: Burnell Blanks, MD;  Location: [place] [facility];  Service: Cardiovascular;  Laterality: N/A;   MUSCLE BIOPSY Left    "leg"   OOPHORECTOMY  07/2010   RADIOLOGY WITH ANESTHESIA N/A 05/25/2016   Procedure: RADIOLOGY WITH ANESTHESIA;  Surgeon: Medication Radiologist, MD;  Location: [place];  Service: Radiology;  Laterality: N/A;   STUMP REVISION Right 06/29/2021   Procedure: REVISION RIGHT BELOW KNEE AMPUTATION;  Surgeon: Newt Minion, MD;  Location: [place];  Service: Orthopedics;  Laterality: Right;   TEE WITHOUT CARDIOVERSION N/A 06/01/2021   Procedure:  TRANSESOPHAGEAL ECHOCARDIOGRAM (TEE);  Surgeon: Geralynn Rile, MD;  Location: [place];  Service: Cardiovascular;  Laterality: N/A;   TONSILLECTOMY AND ADENOIDECTOMY     WRIST FRACTURE SURGERY Left    "crushed it"   Social History   Social History Narrative   Not on file   Immunization History  Administered Date(s) Administered   Influenza Split 06/19/2012, 07/15/2013   Influenza,inj,Quad PF,6+ Mos 06/16/2015, 06/30/2015   Influenza,inj,quad, With Preservative 06/19/2014   Influenza-Unspecified 06/19/2012, 07/15/2013   Pneumococcal Polysaccharide-23 02/25/2014   Tdap 02/17/2013, 02/25/2014     Objective: Vital Signs: There were no vitals taken for this visit.   Physical Exam   Musculoskeletal Exam: ***  CDAI Exam: CDAI Score: -- Patient Global: --; Provider Global: -- Swollen: --; Tender: -- Joint Exam 10/09/2022   No joint exam has been documented for this visit   There is currently no information documented on the homunculus. Go to the Rheumatology activity and complete the homunculus joint exam.  Investigation: No additional findings.  Imaging: No results found.  Recent Labs: Lab Results  Component Value Date   WBC 7.4 07/05/2022   HGB 9.8 Repeated and verified X2. (L) 07/05/2022   PLT 317.0 07/05/2022   NA 139 07/05/2022   K 3.9 07/05/2022   CL 97 07/05/2022   CO2 30 07/05/2022   GLUCOSE 224 (H) 07/05/2022   BUN 11 07/05/2022   CREATININE 0.66 07/05/2022   BILITOT 0.3 06/14/2022   ALKPHOS 69 06/14/2022   AST 32 06/14/2022   ALT 22 06/14/2022   PROT 7.4 06/14/2022   ALBUMIN 3.0 (L) 06/14/2022   CALCIUM 9.5 07/05/2022   GFRAA 87 05/04/2020    Speciality Comments: No specialty comments available.  Procedures:  No procedures performed Allergies: Fish allergy, Fish oil, Omega-3 fatty acids, Victoza [liraglutide], Crestor [rosuvastatin], Gabapentin, Lovastatin, Metronidazole, Red yeast rice [cholestin], Rotigotine, Atrovent hfa [ipratropium  bromide hfa], Iodine, Morphine, Other, Pepto-bismol [bismuth subsalicylate], Enalapril, Suprep [na sulfate-k sulfate-mg sulf], and Tape   Assessment / Plan:     Visit Diagnoses: No diagnosis found.  Orders: No orders of the defined types were placed in this encounter.  No orders of the defined types were placed in this encounter.   Face-to-face time spent with patient was *** minutes. Greater than 50% of time was spent in counseling and coordination of care.  Follow-Up Instructions: No follow-ups on file.   Earnestine Mealing, CMA  Note - This record has been  created using [employer].  Chart creation errors have been sought, but may not always  have been located. Such creation errors do not reflect on  the standard of medical care.

## 2022-09-27 ENCOUNTER — Ambulatory Visit: Payer: Commercial Managed Care - HMO

## 2022-09-28 ENCOUNTER — Ambulatory Visit (HOSPITAL_BASED_OUTPATIENT_CLINIC_OR_DEPARTMENT_OTHER): Payer: Commercial Managed Care - HMO | Admitting: General Surgery

## 2022-09-29 ENCOUNTER — Other Ambulatory Visit: Payer: Self-pay | Admitting: Family Medicine

## 2022-09-29 DIAGNOSIS — J4540 Moderate persistent asthma, uncomplicated: Secondary | ICD-10-CM

## 2022-10-02 ENCOUNTER — Other Ambulatory Visit: Payer: Self-pay | Admitting: Family Medicine

## 2022-10-02 DIAGNOSIS — E559 Vitamin D deficiency, unspecified: Secondary | ICD-10-CM

## 2022-10-04 ENCOUNTER — Ambulatory Visit: Payer: Commercial Managed Care - HMO | Admitting: Rehabilitation

## 2022-10-05 ENCOUNTER — Encounter (HOSPITAL_BASED_OUTPATIENT_CLINIC_OR_DEPARTMENT_OTHER): Payer: Commercial Managed Care - HMO | Admitting: General Surgery

## 2022-10-09 ENCOUNTER — Ambulatory Visit: Payer: Commercial Managed Care - HMO | Admitting: Physician Assistant

## 2022-10-09 DIAGNOSIS — M5136 Other intervertebral disc degeneration, lumbar region: Secondary | ICD-10-CM

## 2022-10-09 DIAGNOSIS — K589 Irritable bowel syndrome without diarrhea: Secondary | ICD-10-CM

## 2022-10-09 DIAGNOSIS — S78111A Complete traumatic amputation at level between right hip and knee, initial encounter: Secondary | ICD-10-CM

## 2022-10-09 DIAGNOSIS — T8781 Dehiscence of amputation stump: Secondary | ICD-10-CM

## 2022-10-09 DIAGNOSIS — M19041 Primary osteoarthritis, right hand: Secondary | ICD-10-CM

## 2022-10-09 DIAGNOSIS — L03119 Cellulitis of unspecified part of limb: Secondary | ICD-10-CM

## 2022-10-09 DIAGNOSIS — Z8639 Personal history of other endocrine, nutritional and metabolic disease: Secondary | ICD-10-CM

## 2022-10-09 DIAGNOSIS — K219 Gastro-esophageal reflux disease without esophagitis: Secondary | ICD-10-CM

## 2022-10-09 DIAGNOSIS — I739 Peripheral vascular disease, unspecified: Secondary | ICD-10-CM

## 2022-10-09 DIAGNOSIS — L89152 Pressure ulcer of sacral region, stage 2: Secondary | ICD-10-CM

## 2022-10-09 DIAGNOSIS — L2089 Other atopic dermatitis: Secondary | ICD-10-CM

## 2022-10-09 DIAGNOSIS — M797 Fibromyalgia: Secondary | ICD-10-CM

## 2022-10-09 DIAGNOSIS — G894 Chronic pain syndrome: Secondary | ICD-10-CM

## 2022-10-09 DIAGNOSIS — M351 Other overlap syndromes: Secondary | ICD-10-CM

## 2022-10-09 DIAGNOSIS — M19072 Primary osteoarthritis, left ankle and foot: Secondary | ICD-10-CM

## 2022-10-09 DIAGNOSIS — K760 Fatty (change of) liver, not elsewhere classified: Secondary | ICD-10-CM

## 2022-10-09 DIAGNOSIS — I2510 Atherosclerotic heart disease of native coronary artery without angina pectoris: Secondary | ICD-10-CM

## 2022-10-09 DIAGNOSIS — I5033 Acute on chronic diastolic (congestive) heart failure: Secondary | ICD-10-CM

## 2022-10-09 DIAGNOSIS — Z79899 Other long term (current) drug therapy: Secondary | ICD-10-CM

## 2022-10-09 DIAGNOSIS — H905 Unspecified sensorineural hearing loss: Secondary | ICD-10-CM

## 2022-10-09 DIAGNOSIS — I10 Essential (primary) hypertension: Secondary | ICD-10-CM

## 2022-10-09 DIAGNOSIS — Z8619 Personal history of other infectious and parasitic diseases: Secondary | ICD-10-CM

## 2022-10-09 DIAGNOSIS — R7881 Bacteremia: Secondary | ICD-10-CM

## 2022-10-09 DIAGNOSIS — E1165 Type 2 diabetes mellitus with hyperglycemia: Secondary | ICD-10-CM

## 2022-10-09 DIAGNOSIS — J4541 Moderate persistent asthma with (acute) exacerbation: Secondary | ICD-10-CM

## 2022-10-11 ENCOUNTER — Telehealth: Payer: Self-pay | Admitting: Family Medicine

## 2022-10-11 NOTE — Telephone Encounter (Signed)
Pt is having trouble getting her dexcom to work and stated Tammy had offered to show her in person. She would like to get that set up.

## 2022-10-11 NOTE — Telephone Encounter (Signed)
Patient needs assistance with setting up DexCom 7 on her phone and starting sensors. Appt set for 10/16/2022 at 9:45am

## 2022-10-13 ENCOUNTER — Encounter (HOSPITAL_BASED_OUTPATIENT_CLINIC_OR_DEPARTMENT_OTHER): Payer: Commercial Managed Care - HMO | Attending: General Surgery | Admitting: General Surgery

## 2022-10-13 DIAGNOSIS — E11621 Type 2 diabetes mellitus with foot ulcer: Secondary | ICD-10-CM | POA: Diagnosis present

## 2022-10-13 DIAGNOSIS — M869 Osteomyelitis, unspecified: Secondary | ICD-10-CM | POA: Insufficient documentation

## 2022-10-13 DIAGNOSIS — M3500 Sicca syndrome, unspecified: Secondary | ICD-10-CM | POA: Insufficient documentation

## 2022-10-13 DIAGNOSIS — L97423 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: Secondary | ICD-10-CM | POA: Diagnosis not present

## 2022-10-13 DIAGNOSIS — I890 Lymphedema, not elsewhere classified: Secondary | ICD-10-CM | POA: Insufficient documentation

## 2022-10-13 DIAGNOSIS — Z6841 Body Mass Index (BMI) 40.0 and over, adult: Secondary | ICD-10-CM | POA: Diagnosis not present

## 2022-10-13 DIAGNOSIS — I5032 Chronic diastolic (congestive) heart failure: Secondary | ICD-10-CM | POA: Diagnosis not present

## 2022-10-13 DIAGNOSIS — E1169 Type 2 diabetes mellitus with other specified complication: Secondary | ICD-10-CM | POA: Diagnosis not present

## 2022-10-13 DIAGNOSIS — Z89511 Acquired absence of right leg below knee: Secondary | ICD-10-CM | POA: Diagnosis not present

## 2022-10-13 NOTE — Progress Notes (Signed)
Nardo, [name] ([id number]) [id number] Page 1 of 7 Visit Report for 10/13/2022 Arrival Information Details Patient Name: Date of Service: [name], [name] 10/13/2022 10:45 A [name] Medical Record Number: [id number] Patient Account Number: [id number] Date of Birth/Sex: Treating RN: [birth year] (62 y.o. F) Primary Care [name]: [name] Other Clinician: Referring Yolonda Purtle: Treating [name]: [name] in Treatment: 12 Visit Information History Since Last Visit All ordered tests and consults were completed: No Patient Arrived: Wheel Chair Added or deleted any medications: No Arrival Time: 10:48 Any new allergies or adverse reactions: No Accompanied By: husband Had a fall or experienced change in No Transfer Assistance: None activities of daily living that may affect Patient Identification Verified: Yes risk of falls: Secondary Verification Process Completed: Yes Signs or symptoms of abuse/neglect since last visito No Patient Requires Transmission-Based Precautions: No Hospitalized since last visit: No Patient Has Alerts: No Implantable device outside of the clinic excluding No cellular tissue based products placed in the center since last visit: Pain Present Now: No Electronic Signature(s) Signed: 10/13/2022 11:04:14 AM By: Worthy [name] Entered By: Worthy [name] on 10/13/2022 10:49:04 -------------------------------------------------------------------------------- Encounter Discharge Information Details Patient Name: Date of Service: [name], [name] 10/13/2022 10:45 A [name] Medical Record Number: [id number] Patient Account Number: [id number] Date of Birth/Sex: Treating RN: [name] (62 y.o. [name] Primary Care Mlissa Tamayo: [name] Other Clinician: Referring Gretchen Weinfeld: Treating Berdia Lachman/Extender: [name] in Treatment: 12 Encounter Discharge Information  Items Post Procedure Vitals Discharge Condition: Stable Temperature (F): 97.7 Ambulatory Status: Wheelchair Pulse (bpm): 85 Discharge Destination: Home Respiratory Rate (breaths/min): 22 Transportation: Private Auto Blood Pressure (mmHg): 120/71 Accompanied By: husband Schedule Follow-up Appointment: Yes Clinical Summary of Care: Patient Declined Electronic Signature(s) Signed: 10/13/2022 4:41:03 PM By: [name] Entered By: [name] on 10/13/2022 11:16:39 Edman, [name] ([id number]) [id number] Page 2 of 7 -------------------------------------------------------------------------------- Lower Extremity Assessment Details Patient Name: Date of Service: [name], [name] 10/13/2022 10:45 A [name] Medical Record Number: [id number] Patient Account Number: [id number] Date of Birth/Sex: Treating RN: [name] (62 y.o. [name] Primary Care Jelicia Nantz: [name] Other Clinician: Referring Zoee Heeney: Treating Karriem Muench/Extender: [name] in Treatment: 12 Edema Assessment Assessed: [name]: No] [Right: No] Edema: [Left: Ye] [Right: s] Calf Left: Right: Point of Measurement: 30 cm From Medial Instep 45 cm Ankle Left: Right: Point of Measurement: 9 cm From Medial Instep 26 cm Electronic Signature(s) Signed: 10/13/2022 4:41:03 PM By: [name] Entered By: [name] on 10/13/2022 10:55:06 -------------------------------------------------------------------------------- Multi Wound Chart Details Patient Name: Date of Service: [name], [name] 10/13/2022 10:45 A [name] Medical Record Number: [id number] Patient Account Number: [id number] Date of Birth/Sex: Treating RN: [name] (62 y.o. F) Primary Care Carrera Kiesel: [name] Other Clinician: Referring Katisha Shimizu: Treating Timeka Goette/Extender: [name] in Treatment: 12 Vital Signs Height(in):  62 Capillary Blood Glucose(mg/dl): 129 Weight(lbs): 284 Pulse(bpm): 9 Body Mass Index(BMI): 51.9 Blood Pressure(mmHg): 120/71 Temperature(F): 97.7 Respiratory Rate(breaths/min): 22 [19:Photos:] [N/A:N/A] Left, Plantar Foot N/A N/A Wound Location: Gradually Appeared N/A N/A Wounding Event: Diabetic Wound/Ulcer of the Lower N/A N/A Primary Etiology: Extremity Cataracts, Lymphedema, Asthma, N/A N/A Comorbid History: Congestive Heart Failure, Coronary [name] ([id number]) [id number] Page 3 of 7 Artery Disease, Hypertension, Peripheral Arterial Disease, Peripheral Venous Disease, Type II Diabetes, Rheumatoid Arthritis, Neuropathy 05/17/2022 N/A N/A Date Acquired: 12 N/A N/A Weeks of Treatment: Open N/A N/A Wound Status: No N/A N/A Wound Recurrence: 10x2.8x0.3 N/A N/A Measurements L x W x  D (cm) 21.991 N/A N/A A (cm) : rea 6.597 N/A N/A Volume (cm) : 2.10% N/A N/A % Reduction in A rea: 26.60% N/A N/A % Reduction in Volume: Grade 2 N/A N/A Classification: Medium N/A N/A Exudate A mount: Serosanguineous N/A N/A Exudate Type: red, brown N/A N/A Exudate Color: Distinct, outline attached N/A N/A Wound Margin: Large (67-100%) N/A N/A Granulation A mount: Red, Pink N/A N/A Granulation Quality: Small (1-33%) N/A N/A Necrotic A mount: Fat Layer (Subcutaneous Tissue): Yes N/A N/A Exposed Structures: Fascia: No Tendon: No Muscle: No Joint: No Bone: No Small (1-33%) N/A N/A Epithelialization: Debridement - Selective/Open Wound N/A N/A Debridement: Pre-procedure Verification/Time Out 10:58 N/A N/A Taken: Lidocaine 5% topical ointment N/A N/A Pain Control: Slough N/A N/A Tissue Debrided: Non-Viable Tissue N/A N/A Level: 16 N/A N/A Debridement A (sq cm): rea Curette N/A N/A Instrument: Minimum N/A N/A Bleeding: Pressure N/A N/A Hemostasis A chieved: Procedure was tolerated well N/A N/A Debridement Treatment  Response: 10x2.8x0.3 N/A N/A Post Debridement Measurements L x W x D (cm) 6.597 N/A N/A Post Debridement Volume: (cm) Callus: Yes N/A N/A Periwound Skin Texture: Excoriation: No Induration: No Crepitus: No Rash: No Scarring: No Dry/Scaly: Yes N/A N/A Periwound Skin Moisture: Maceration: No Erythema: Yes N/A N/A Periwound Skin Color: Atrophie Blanche: No Cyanosis: No Ecchymosis: No Hemosiderin Staining: No Mottled: No Pallor: No Rubor: No No Abnormality N/A N/A Temperature: Debridement N/A N/A Procedures Performed: Treatment Notes Electronic Signature(s) Signed: 10/13/2022 11:04:13 AM By: Fredirick Maudlin MD FACS Entered By: Fredirick Maudlin on 10/13/2022 11:04:13 -------------------------------------------------------------------------------- Multi-Disciplinary Care Plan Details Patient Name: Date of Service: [name], [name] 10/13/2022 10:45 A [name] Medical Record Number: [id number] Patient Account Number: [id number] Date of Birth/Sex: Treating RN: [birth year] (62 y.o. [name] Primary Care Calton Harshfield: [name] Other Clinician: Referring Charleston Hankin: Treating Modest Draeger/Extender: Maris Berger Bruster, Coleman ([id number]) [id number] Page 4 of 7 Weeks in Treatment: 12 Active Inactive Venous Leg Ulcer Nursing Diagnoses: Potential for venous Insuffiency (use before diagnosis confirmed) Goals: Patient will maintain optimal edema control Date Initiated: 07/19/2022 Target Resolution Date: 11/10/2022 Goal Status: Active Interventions: Assess peripheral edema status every visit. Treatment Activities: Therapeutic compression applied : 07/19/2022 Notes: Electronic Signature(s) Signed: 10/13/2022 4:41:03 PM By: [name] Entered By: [name] on 10/13/2022 10:41:32 -------------------------------------------------------------------------------- Pain Assessment Details Patient Name: Date of  Service: [name], [name] 10/13/2022 10:45 A [name] Medical Record Number: [id number] Patient Account Number: [id number] Date of Birth/Sex: Treating RN: [birth year] (62 y.o. F) Primary Care Ayven Glasco: [name] Other Clinician: Referring Duell Holdren: Treating Sai Moura/Extender: [name] in Treatment: 12 Active Problems Location of Pain Severity and Description of Pain Patient Has Paino Yes Site Locations Rate the pain. Current Pain Level: 7 Worst Pain Level: 10 Least Pain Level: 0 Tolerable Pain Level: 2 Pain Management and Medication Current Pain Management: Electronic Signature(s) Signed: 10/13/2022 11:04:14 AM By: Worthy [name] Thedford, Hatsumi [name] ([id number]) [id number] Page 5 of 7 Entered By: Worthy [name] on 10/13/2022 10:49:59 -------------------------------------------------------------------------------- Patient/Caregiver Education Details Patient Name: Date of Service: [name], [name] 2/2/2024andnbsp10:45 A [name] Medical Record Number: [id number] Patient Account Number: [id number] Date of Birth/Gender: Treating RN: [name] (62 y.o. [name] Primary Care Physician: [name] Other Clinician: Referring Physician: Treating Physician/Extender: [name] in Treatment: 12 Education Assessment Education Provided To: Patient Education Topics Provided Wound/Skin Impairment: Methods: Explain/Verbal Responses: Reinforcements needed, State content correctly [employer]) Signed: 10/13/2022 4:41:03 PM By: [name] Entered By: [name]  on 10/13/2022 10:41:44 -------------------------------------------------------------------------------- Wound Assessment Details Patient Name: Date of Service: [name], [name] 10/13/2022 10:45 A [name] Medical Record Number: [id number] Patient Account Number: [id number] Date of Birth/Sex: Treating RN: [name] (62  y.o. [name] Primary Care Jerelene Salaam: [name] Other Clinician: Referring Aurelie Dicenzo: Treating Kael Keetch/Extender: [name] in Treatment: 12 Wound Status Wound Number: 19 Primary Diabetic Wound/Ulcer of the Lower Extremity Etiology: Wound Location: Left, Plantar Foot Wound Open Wounding Event: Gradually Appeared Status: Date Acquired: 05/17/2022 Comorbid Cataracts, Lymphedema, Asthma, Congestive Heart Failure, Weeks Of Treatment: 12 History: Coronary Artery Disease, Hypertension, Peripheral Arterial Disease, Clustered Wound: No Peripheral Venous Disease, Type II Diabetes, Rheumatoid Arthritis, Neuropathy Photos Cariker, Melida [name] ([id number]) [id number] Page 6 of 7 Wound Measurements Length: (cm) 10 Width: (cm) 2.8 Depth: (cm) 0.3 Area: (cm) 21.991 Volume: (cm) 6.597 % Reduction in Area: 2.1% % Reduction in Volume: 26.6% Epithelialization: Small (1-33%) Tunneling: No Undermining: No Wound Description Classification: Grade 2 Wound Margin: Distinct, outline attached Exudate Amount: Medium Exudate Type: Serosanguineous Exudate Color: red, brown Foul Odor After Cleansing: No Slough/Fibrino Yes Wound Bed Granulation Amount: Large (67-100%) Exposed Structure Granulation Quality: Red, Pink Fascia Exposed: No Necrotic Amount: Small (1-33%) Fat Layer (Subcutaneous Tissue) Exposed: Yes Necrotic Quality: Adherent Slough Tendon Exposed: No Muscle Exposed: No Joint Exposed: No Bone Exposed: No Periwound Skin Texture Texture Color No Abnormalities Noted: No No Abnormalities Noted: No Callus: Yes Atrophie Blanche: No Crepitus: No Cyanosis: No Excoriation: No Ecchymosis: No Induration: No Erythema: Yes Rash: No Hemosiderin Staining: No Scarring: No Mottled: No Pallor: No Moisture Rubor: No No Abnormalities Noted: No Dry / Scaly: Yes Temperature / Pain Maceration: No Temperature: No  Abnormality Treatment Notes Wound #19 (Foot) Wound Laterality: Plantar, Left Cleanser Soap and Water Discharge Instruction: May shower and wash wound with dial antibacterial soap and water prior to dressing change. Wound Cleanser Discharge Instruction: Cleanse the wound with wound cleanser prior to applying a clean dressing using gauze sponges, not tissue or cotton balls. Peri-Wound Care Topical Primary Dressing Sorbalgon AG Dressing 6x6 (in/in) Discharge Instruction: Apply to wound bed as instructed Secondary Dressing Woven Gauze Sponge, Non-Sterile 4x4 in Discharge Instruction: Apply over primary dressing as directed. Zetuvit Plus 4x8 in Discharge Instruction: Apply over primary dressing as directed. Secured With Elastic Bandage 4 inch (ACE bandage) Discharge Instruction: Secure with ACE bandage as directed. Kerlix Roll Sterile, 4.5x3.1 (in/yd) Discharge Instruction: Secure with Kerlix as directed. 91M Medipore Soft Cloth Surgical T 2x10 (in/yd) ape Discharge Instruction: Secure with tape as directed. Compression Wrap Compression Stockings [name], [name] ([id number]) [id number] Page 7 of 7 Add-Ons Electronic Signature(s) Signed: 10/13/2022 4:41:03 PM By: Sabas Sous By: [name] on 10/13/2022 10:55:24 -------------------------------------------------------------------------------- Vitals Details Patient Name: Date of Service: [name], [name] 10/13/2022 10:45 A [name] Medical Record Number: [id number] Patient Account Number: [id number] Date of Birth/Sex: Treating RN: [name] (62 y.o. F) Primary Care Romel Dumond: [name] Other Clinician: Referring Annita Ratliff: Treating Emmarose Klinke/Extender: [name] in Treatment: 12 Vital Signs Time Taken: 10:49 Temperature (F): 97.7 Height (in): 62 Pulse (bpm): 85 Weight (lbs): 284 Respiratory Rate (breaths/min): 22 Body Mass Index (BMI):  51.9 Blood Pressure (mmHg): 120/71 Capillary Blood Glucose (mg/dl): 129 Reference Range: 80 - 120 mg / dl Electronic Signature(s) Signed: 10/13/2022 11:04:14 AM By: Worthy [name] Entered By: Worthy [name] on 10/13/2022 10:49:38

## 2022-10-13 NOTE — Progress Notes (Signed)
[name], [name] ([id number]) [id number] Page 1 of 13 Visit Report for 10/13/2022 Chief Complaint Document Details Patient Name: Date of Service: [name], [name]. 10/13/2022 10:45 A [name] Medical Record Number: [id number] Patient Account Number: [id number] Date of Birth/Sex: Treating RN: 1961-03-24 (62 y.o. F) Primary Care Provider: [name] Other Clinician: Referring Provider: Treating Provider/Extender: Vivien Rossetti in Treatment: 12 Information Obtained from: Patient Chief Complaint 10/17/2021; multiple scattered wounds to the abdomen, posterior left leg, left labia and sacrum 07/19/2022: left foot DFU Electronic Signature(s) Signed: 10/13/2022 11:04:22 AM By: Duanne Guess MD FACS Entered By: Duanne Guess on 10/13/2022 11:04:22 -------------------------------------------------------------------------------- Debridement Details Patient Name: Date of Service: [name], [name] 10/13/2022 10:45 A [name] Medical Record Number: [id number] Patient Account Number: [id number] Date of Birth/Sex: Treating RN: Jun 19, 1961 (62 y.o. [name] Primary Care Provider: [name] Other Clinician: Referring Provider: Treating Provider/Extender: Vivien Rossetti in Treatment: 12 Debridement Performed for Assessment: Wound #19 Left,Plantar Foot Performed By: Physician Duanne Guess, MD Debridement Type: Debridement Severity of Tissue Pre Debridement: Fat layer exposed Level of Consciousness (Pre-procedure): Awake and Alert Pre-procedure Verification/Time Out Yes - 10:58 Taken: Start Time: 10:58 Pain Control: Lidocaine 5% topical ointment T Area Debrided (L x [name]): otal 8 (cm) x 2 (cm) = 16 (cm) Tissue and other material debrided: Non-Viable, Slough, Biofilm, Slough Level: Non-Viable Tissue Debridement Description: Selective/Open Wound Instrument: Curette Bleeding: Minimum Hemostasis  Achieved: Pressure Response to Treatment: Procedure was tolerated well Level of Consciousness (Post- Awake and Alert procedure): Post Debridement Measurements of Total Wound Length: (cm) 10 Width: (cm) 2.8 Depth: (cm) 0.3 Volume: (cm) 6.597 Character of Wound/Ulcer Post Debridement: Improved Severity of Tissue Post Debridement: Fat layer exposed [name], [name] ([id number]) [id number] Page 2 of 13 Post Procedure Diagnosis Same as Pre-procedure Notes scribed for Dr. Lady Gary by Samuella Bruin, RN Electronic Signature(s) Signed: 10/13/2022 11:24:18 AM By: Duanne Guess MD FACS Signed: 10/13/2022 4:41:03 PM By: Samuella Bruin Entered By: Samuella Bruin on 10/13/2022 11:00:17 -------------------------------------------------------------------------------- HPI Details Patient Name: Date of Service: [name], [name] 10/13/2022 10:45 A [name] Medical Record Number: [id number] Patient Account Number: [id number] Date of Birth/Sex: Treating RN: [birth year] (62 y.o. F) Primary Care Provider: [name] Other Clinician: Referring Provider: Treating Provider/Extender: Vivien Rossetti in Treatment: 12 History of Present Illness HPI Description: 07/23/2019 on evaluation today patient presents with a myriad of wounds noted at multiple locations over her right heel, right lower extremity, and abdominal region. She also has bilateral lower extremity lymphedema which is quite significant as well. Incidentally she also has congestive heart failure and hypertension. She also is obese. With that being said I think all this is contributing as well to her lower extremity edema which is very much uncontrolled. It seems like its been at least several months since she is worn any compression according to what she tells me. With that being said I am not sure exactly when that would have been. She does have fibrotic changes in the lower extremity  secondary to lymphedema worse on the left than the right. She did show me pictures of wound she had on the anterior portion of her shin which was quite significant fortunately that has healed. These issues have been intermittent at this time. Again I think that with appropriate compression therapy she may actually be doing better than what we are seeing at this point but again I am not really sure that she is ever been extremely compliant with  that. Fortunately there is no signs of active infection at this time. No fever chills noted. As far as the abdominal ulcer she initially had one wound that she thinks may have been a bug bite. Subsequently she was put a dressing on this and she states that the tape pulled skin off on other locations causing other wounds that have not healed that is been about 3 months. The wounds on her lower extremities have been intermittent over 3 years. This includes the heel. 11/19; this is a patient that I have not seen previously. She was admitted to our clinic last week with multiple wounds including several superficial circular areas on her abdomen predominantly right upper quadrant. Also 1 in her umbilicus. She has an area on her right lateral malleolus which was new today. She has bilateral lower extremity edema with very significant stasis dermatitis on the left anterior tibial area. She has tightly adherent skin in her lower extremities probably secondary to cutaneous fibrosis in the area. We put her in compression last week. She comes in with the dressings reasonably saturated. 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 She has multiple areas on her arms from scratching. 12/3; the patient I saw for the first time 2 weeks ago. She has 3 small circular areas on her abdomen 2  in the right upper quadrant one on her umbilicus. We have been using silver alginate to this area. She also had an area on her right lateral malleolus and right heel and right medial malleolus. We have been using silver alginate here under compression. She does not have an open area on the left leg today. We are going to order her stockings for the left leg. She clearly has chronic lymphedema in these areas. X-ray of the foot from 10/20 did not show any fracture or dislocation. The heel wound was noted there was no evidence of osteomyelitis She complains of generalized pruritus. 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 She has longstanding lymphedema. Although she does not have known liver disease I looked at his CT scan of the abdomen from February of this year that showed hepatic steatosis 12/11; patient still has no open area on the left leg we transitioned her into a stocking she had. Her stockings from [employer] are supposed to be arriving later today which will be the stockings of choice. The only wound remaining on the right is the right heel 2 small superficial open areas. Everything else is well on its way to healing in the right leg few small excoriations. She has 1 major area remaining on her abdomen the rest seem to be healing 12/22 patient still has no open area on the left leg and she is still in a stocking. She had still 2 open areas on the tip of her right heel. These look like pressure related areas although the patient is not really certain how this is happening. She has an open heeled shoe that we provided. Still has 1 open area on the right lateral abdomen The patient has complaints of generalized pruritus. She has multiple excoriated areas on her abdomen that of close over her arms or thighs which I think are from scratching. She tells me that she has never had a basic work-up for this which might include basic lab work, liver functions, kidney  functions, thyroid etc. She might also benefit from a dermatologist 12/29; the patient has a deeper larger more painful wound on the tip of her right heel. Again these look like pressure ulcers although she wears an open toed heel pads or heel at night to prevent it from hitting the mattress etc. They tell me and remind me that this is been present on and off for about 2 years that has closed over but then will reopen. I did look over her lab work that was available in [facility] link. She does not have elevated liver function tests or creatinine. I was not able to see a TSH. This was in response to her complaints of generalized itching and a itch/scratch cycle. I also noted that she is on OxyContin and oxycodone and of course narcotics can cause generalized pruritus as a side effect Readmission: History [place] Last Week: 03/29/2021 this is a patient who presents for initial evaluation here in the clinic though I have seen her 1 time previously in 2020 this was in [place]. That was actually her initial visit therefore a heel ulceration. Subsequently that did not healing she actually saw me the 1 time in November and then subsequently saw Dr. Leanord Hawkingobson through the end of December where she apparently was healed. 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 This is on the right. With that being said right now she has a basically region on the lateral portion of the ray site where she is applying more pressure especially as her foot seems to be starting to turn in and this has become an increasingly [place], Blasa [name] ([id number]) [id number] Page 3 of 13 significant issue for her to be honest. She does see Dr. Lajoyce Cornersuda and Dr. Lajoyce Cornersuda has had her on doxycycline for quite a bit of time here. Subsequently she is just now wrapping that out. I think that she probably  would be benefited by continue the doxycycline for a time while we can work through what we need to do with things here. She does have evidence of osteomyelitis based on what I saw on the MRI today. This obviously is unfortunate and definitely not something that she was hoping to hear. With that being said I do believe that she would potentially be a strong candidate for hyperbaric oxygen therapy. Also believe she could be a candidate for total contact cast though we have to be cautious due to the fact that how her foot is bending inward I think that this is good to be a little bit of a concern. We will definitely have to keeping a close eye on things. She does have a history of diabetes mellitus type 2. She also other than the amputation has a medical history positive for hypertension. She has never undergone hyperbaric oxygen therapy previously I did show her the chamber we did talk a little bit about it today as well. Admission T [facility]: o 04/06/2021 Patient presents here in [place] today for evaluation. Again she is establishing care here after I saw her last week in [place]. Obviously I think that she is doing extremely well at this point which is great news and in general I am extremely pleased with where things stand from the standpoint of the appearance of the wound. Obviously this is not significantly smaller but does look a lot cleaner I think using the Hydrofera  Blue has been of benefit over the past week. Nonetheless she still has a wound with issues with pressure here which I think is good to be an ongoing issue. Also think that the osteomyelitis which is chronic is also getting ongoing issue. She does want to try to do what she can to prevent this from worsening and ending with a below-knee amputation. T that end I do think that getting her into the hyperbaric oxygen chamber would be what we need to do. She has recently been seen by cardiology o and subsequently well as  far as that is concerned. Her ejection fraction was appropriate for hyperbarics and everything seems to be doing great in that regard. She has not had a recent chest x-ray she is a former smoker around 15 years ago she quit. Nonetheless I do believe with her asthma we do want to do a chest x-ray just to make sure everything is okay before proceeding with the hyperbarics although we can go ahead and see about getting the approval. I also think she is going require some sharp debridement and around this wound we also have to contact her insurance for prior approval on this as well. 04/13/2021 upon evaluation today patient appears to be doing a little bit better in regard to her leg in fact the swelling is dramatically better. Very pleased with where things stand in that regard. Fortunately there does not appear to be any signs of active infection at this time. No fevers, chills, nausea, vomiting, or diarrhea. 04/20/2021 upon evaluation today patient appears to be doing decently well in regard to her foot. There is some need for sharp debridement here today. With that being said we will get a go ahead and proceed with that today as the foot is becoming somewhat macerated with the overhanging callus that is definitely not we want to see. With that being said the patient does have an issue here as well with a boil in the perineal region unfortunately that is an issue for her today as well. I did have a look at that as well. We are still working on dealing with insurance as far as getting hyperbarics approved. 04/27/2021 upon evaluation today patient appears to be doing somewhat poorly in general compared to where she has been. At this point she is having a lot of swelling which is the main concerning thing that I am seeing. There does not appear to be any signs of infection currently which is good news but at the same time I do feel like that she is a lot more swollen than she was even last week and is showing  how much she is weeping in regard to the right lower leg. She also has a blister on the left breast although is not an open wound at this time. She continues to have the area in the perineum as well. 05/04/2021 upon evaluation today patient appears to actually be doing decently well in regard to her wound on the foot. Fortunately there is no signs of active infection at this time. No fevers, chills, nausea, vomiting, or diarrhea. Unfortunately she does have an area on the left breast which is actually appearing to be a burn based on what I see physically. She does note that she uses rice bags for areas in general and may have left one in that area not realizing it was burning her as she does not really have much feeling. I think this is very probable based on what I  am seeing. For that reason working to treat this as such I do think we need to loosen up a lot of the necrotic tissue here. I Am going tosend in a prescription for Santyl for the patient. 9/1; patient presents for HBO today but cannot do treatment due to wheezing and feeling short of breath when laying down. She was set up as a doctor visit today since she was not able to do HBO. She states she feels okay overall and started having a dry cough over the past couple days. She has not tested herself for COVID. She denies fever/chills or sputum production. She thinks her symptoms are related to allergies. She has been using silver alginate to her right plantar foot wound. She has not been using Santyl to the breast wound because she reports forgetting to do this. She uses zinc oxide to her perennial wound. She currently denies systemic signs of infection. 9/8; patient presents for follow-up. She has been a unable to do HBO treatments for the past week due to her back pain. She is currently taking Flexeril to address this issue. She reports no issues with her compression wrap. She has been putting Santyl on the left breast wound and Monistat cream  to the perennial wound. She denies signs of infection. 9/15; patient presents for follow-up. Again she is unable to do HBO treatment today due to sinus pain. She has 2 new wounds to her right foot which she is unaware of. She has been putting Santyl on the left breast wound and zinc oxide to the perineal wound. She currently denies signs of infection. Readmission 10/17/2021 Ms. Katarzyna Wolven is a 62 year old female with a past medical history of type 2 diabetes, osteomyelitis status post right BKA and morbid obesity that presents to the clinic for multiple scattered open wounds to her body. These are located to the abdomen, left posterior leg and sacral region. She has been using mupirocin ointment, Neosporin and zinc oxide to the wound beds. She has been started on Bactrim and Keflex by her primary care physician and states she has several days left to complete the course. She currently denies systemic signs of infection. READMISSION This is a 62 year old morbidly obese type II diabetic (poorly controlled with last hemoglobin A1c 9.2%). She has congestive heart failure, peripheral vascular disease, hypertension, coronary artery disease, venous stasis, connective tissue disease (o Sjogren's syndrome), and bilateral lower extremity edema. She has been seen here for various reasons in the past. She has a right above-knee amputation. She was recently hospitalized for a left diabetic foot ulcer. Orthopedic surgery was consulted. An MRI was performed that was not conclusive for osteomyelitis, but given the extent of the necrosis, orthopedics recommended amputation. Infectious disease was also consulted and have recommended a 6-week course of oral doxycycline and Augmentin. Apparently wound care was also consulted while she was in the hospital and simply recommended painting the area with Betadine. She saw infectious disease on October 27 with plans to continue doxycycline on Augmentin to continue therapy  until November 14. The infectious disease provider also recommended that the patient reconsider amputation. Her PCP referred her to the wound care center, as the patient is very reluctant to undergo amputation. She also has a small ulcer on her anterior tibial surface that recently opened after a minor trauma. On exam, her left foot is rolled such that the lateral aspect of her foot is the contact surface. There is a large ulcer with exposed necrotic muscle and fat. The  wound does not probe to bone, but apparently there was bone exposure in the past while she was in the hospital. No malodor or purulent drainage. The wound on her anterior tibial surface is small with just a little slough accumulation. She has modest edema and skin changes consistent with stasis dermatitis. 07/28/2022: The anterior tibial wound is healed. The left plantar foot wound looks a little bit better today. There is significantly less necrotic tissue present. There is an area at the most caudal aspect of the wound that looks like there is ongoing pressure-induced tissue injury. Bone remains covered. 08/11/2022: The wound has deteriorated quite a bit since her last visit. Her husband reports that she has had significantly more drainage, such that he has had to change the dressing multiple times per day. During the course of the visit, she recalled that she had not been taking her Lasix for at least 4 days. There is substantial periwound maceration and further tissue breakdown. There is necrotic tissue at the midportion of the wound and tendon is now exposed underneath this necrotic muscle. 08/17/2022: The wound looks better this week. There is still an area of necrotic muscle and tendon in the midfoot, but the forefoot has improved with some epithelium beginning to come in from the margins. Unfortunately, there is evidence of ongoing pressure-induced tissue injury near the heel. The patient does admit to resting her heel on a stool  frequently. She is still taking Bactrim as prescribed and her Keystone topical antibiotic compound should arrive at their home today. 12/14; fairly large sized wound on the left plantar foot. She has a right AKA. She uses a leg only for transfers she says she does not propel her wheelchair with that foot. She has been using Keystone, silver alginate and an Ace wrap. She has completed her Bactrim and Augmentin. She is a type II diabetic. The most worrisome part of this wound is a smaller area roughly 2 x 2 in the distal part of the wound which is 100% occupied by necrotic tendon [name], [name] ([id number]) [id number] Page 4 of 13 09/07/2022: She had an extensive debridement at her last visit. The wound is smaller today and all of the tissues appear healthier. 09/15/2022: The wound measurements are about the same, but overall the surface appears healthier. There is still a little bit of the nonviable tendon exposed at the midfoot. 10/13/2022: The patient has been ill and unable to attend clinic for almost a month. Today, the wound measures smaller and there are buds of epithelium beginning to emerge, particularly at the calcaneus. There is a bridge of skin trying to come across the midportion of the wound, as well. Minimal slough accumulation. Electronic Signature(s) Signed: 10/13/2022 11:05:10 AM By: Fredirick Maudlin MD FACS Entered By: Fredirick Maudlin on 10/13/2022 11:05:09 -------------------------------------------------------------------------------- Physical Exam Details Patient Name: Date of Service: Strehle, [name] 10/13/2022 10:45 A [name] Medical Record Number: [id number] Patient Account Number: [id number] Date of Birth/Sex: Treating RN: 06-23-1961 (62 y.o. F) Primary Care Provider: [name] Other Clinician: Referring Provider: Treating Provider/Extender: Louann Sjogren in Treatment: 12 Constitutional . . . . no acute  distress. Respiratory Normal work of breathing on room air. Notes 10/13/2022: T oday, the wound measures smaller and there are buds of epithelium beginning to emerge, particularly at the calcaneus. There is a bridge of skin trying to come across the midportion of the wound, as well. Minimal slough accumulation. Electronic Signature(s) Signed: 10/13/2022 11:05:51 AM By: Fredirick Maudlin  MD FACS Entered By: Duanne Guess on 10/13/2022 11:05:51 -------------------------------------------------------------------------------- Physician Orders Details Patient Name: Date of Service: Taussig, [name] 10/13/2022 10:45 A [name] Medical Record Number: [id number] Patient Account Number: [id number] Date of Birth/Sex: Treating RN: [birth year] (62 y.o. 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 - Dr. Lady Gary - room 2 Return A Anesthetic (In clinic) Topical Lidocaine 5% applied to wound bed Bathing/ Shower/ Hygiene May shower and wash wound with soap and water. - with dressing changes Edema Control - Lymphedema / SCD / Other Elevate legs to the level of the heart or above for 30 minutes daily and/or when sitting for 3-4 times a day throughout the day. Avoid  standing for long periods of time. Off-Loading Other: - Try and elevate Left leg throughout the day Wound Treatment Wound #19 - Foot Wound Laterality: Plantar, Left Cleanser: Soap and Water 1 x Per Day/30 Days Discharge Instructions: May shower and wash wound with dial antibacterial soap and water prior to dressing change. Cleanser: Wound Cleanser (Generic) 1 x Per Day/30 Days Discharge Instructions: Cleanse the wound with wound cleanser prior to applying a clean dressing using gauze sponges, not tissue or cotton balls. Prim Dressing: Sorbalgon AG Dressing 6x6 (in/in) (Generic) 1 x Per Day/30 Days ary Discharge Instructions: Apply to wound bed as instructed Secondary Dressing: Woven Gauze Sponge, Non-Sterile 4x4 in 1 x Per Day/30 Days Discharge Instructions: Apply over primary dressing as directed. Secondary Dressing: Zetuvit Plus 4x8 in 1 x Per Day/30 Days Discharge Instructions: Apply over primary dressing as directed. Secured With: Elastic Bandage 4 inch (ACE bandage) (Generic) 1 x Per Day/30 Days Discharge Instructions: Secure with ACE bandage as directed. Secured With: [employer], 4.5x3.1 (in/yd) (Generic) 1 x Per Day/30 Days Discharge Instructions: Secure with Kerlix as directed. Secured With: 80M Medipore Scientist, research (life sciences) Surgical T 2x10 (in/yd) (Generic) 1 x Per Day/30 Days ape Discharge Instructions: Secure with tape as directed. Patient Medications llergies: peanut, morphine, gabapentin, iodine, Victoza, Pepto-Bismol, fish oil, Fish Containing Products, Crestor, lovastatin, metronidazole, Red Yeast Rice A (Monascus Purpureus), rotigotine, Atrovent, Suprep Bowel Prep Kit, adhesive tape, Betadine, Dial soap, Suprep Bowel Prep Kit Notifications Medication Indication Start End 10/13/2022 lidocaine DOSE topical 5 % ointment - ointment topical Electronic Signature(s) Signed: 10/13/2022 11:24:18 AM By: Duanne Guess MD FACS Entered By: Duanne Guess on 10/13/2022  11:08:56 -------------------------------------------------------------------------------- Problem List Details Patient Name: Date of Service: Yambao, [name] 10/13/2022 10:45 A [name] Medical Record Number: [id number] Patient Account Number: [id number] Date of Birth/Sex: Treating RN: 1960-09-24 (62 y.o. 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 07/19/2022 No Yes M86.9 Osteomyelitis, unspecified 07/19/2022 No Yes E11.621 Type 2 diabetes mellitus with foot ulcer  07/19/2022 No Yes E66.01 Morbid (severe) obesity due to excess calories 07/19/2022 No Yes M35.00 Sjogren syndrome, unspecified 07/19/2022 No Yes I73.9 Peripheral vascular disease, unspecified 07/19/2022 No Yes I50.32 Chronic diastolic (congestive) heart failure 07/19/2022 No Yes 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 10/13/2022 10:45 A [name] Medical Record Number: [id number] Patient Account Number: [id number] Date of Birth/Sex: Treating RN: 04-10-1961 (62 y.o. F) Primary Care Provider: [name] Other Clinician: Referring Provider: Treating Provider/Extender: Vivien Rossetti in Treatment: 12 Subjective Chief Complaint Information obtained from Patient 10/17/2021; multiple scattered wounds to the abdomen, posterior left leg, left labia and sacrum 07/19/2022: left foot DFU [name], [name] ([id number]) [id number] Page 7 of 13 History of Present Illness (HPI) 07/23/2019 on evaluation today patient presents with a myriad of wounds noted at multiple locations over her right heel, right lower extremity, and abdominal region. She also has bilateral lower extremity lymphedema which is quite significant as well. Incidentally she also has congestive heart failure and hypertension. She also is obese. With that being said I think all this is contributing as well to her lower extremity edema which is very much uncontrolled. It seems like its been at least several months since she is worn any compression according to what she tells me. With that being said I am not sure exactly when that would have been. She does have fibrotic changes in the lower extremity secondary to lymphedema worse on the left than the right. She did show me pictures of wound she had on the anterior portion of her shin which was quite significant fortunately that has healed. These issues have been intermittent at this time. Again I think that with appropriate compression therapy she may actually be doing better than what we are seeing at this point but again I am not really sure that she is ever been extremely compliant with that. Fortunately there is no signs of active infection at this time. No fever chills noted. As far as the abdominal ulcer she initially had one wound that she thinks may have been a bug bite. Subsequently she was put a dressing on this and she states that the tape pulled skin off on other locations causing other wounds that have not healed that is been about 3 months. The wounds on her lower extremities have been intermittent over  3 years. This includes the heel. 11/19; this is a patient that I have not seen previously. She was admitted to our clinic last week with multiple wounds including several superficial circular areas on her abdomen predominantly right upper quadrant. Also 1 in her umbilicus. She has an area on her right lateral malleolus which was new today. She has bilateral lower extremity edema with very significant stasis dermatitis on the left anterior tibial area. She has tightly adherent skin in her lower extremities probably secondary to cutaneous fibrosis in the area. We put her in compression last week. She comes in with the dressings reasonably saturated. 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 She has multiple areas on her arms from scratching. 12/3; the patient I saw for the first time 2 weeks ago. She has 3 small circular areas on her abdomen 2 in the right upper quadrant one on her umbilicus. We have been using silver alginate to this area. She also had an area on her right lateral malleolus and right heel and right medial malleolus. We have been using silver alginate here under compression. She does not have an open area on the left leg today. We are going to order her stockings for the left leg. She clearly has chronic lymphedema in these areas. X-ray of the foot from 10/20 did not show any fracture or dislocation. The heel wound was noted there was no evidence of osteomyelitis She complains of generalized pruritus. 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 She has longstanding lymphedema. Although she does not have known liver disease I looked at his CT scan of the abdomen from February of this year that showed hepatic steatosis 12/11; patient still has no open area on  the left leg we transitioned her into a stocking she had. Her stockings from [employer] are supposed to be arriving later today which will be the stockings of choice. The only wound remaining on the right is the right heel 2 small superficial open areas. Everything else is well on its way to healing in the right leg few small excoriations. She has 1 major area remaining on her abdomen the rest seem to be healing 12/22 patient still has no open area on the left leg and she is still in a stocking. She had still 2 open areas on the tip of her right heel. These look like pressure related areas although the patient is not really certain how this is happening. She has an open heeled shoe that we provided. Still has 1 open area on the right lateral abdomen The patient has complaints of generalized pruritus. She has multiple excoriated areas on her abdomen that of close over her arms or thighs which I think are from scratching. She tells me that she has never had a basic work-up for this which might include basic lab work, liver functions, kidney functions, thyroid etc. She might also benefit from a dermatologist 12/29; the patient has a deeper larger more painful wound on the tip of her right heel. Again these look like pressure ulcers although she wears an open toed heel pads or heel at night to prevent it from hitting the mattress etc. They tell me and remind me that this is been present on and off for about 2 years that has closed over but then will reopen. I did look over her lab work that was available in [facility]. She does not have elevated liver function tests or creatinine. I was not able to see a TSH. This was in response to her complaints of generalized itching and a itch/scratch cycle. I also noted that she is on OxyContin and oxycodone and of course narcotics can cause generalized pruritus as a side effect Readmission: [place] Last Week: 03/29/2021 this is a patient who  presents for initial evaluation here in the clinic though I have seen her 1 time previously in 2020 this was in [place]. That was actually her initial visit therefore a heel ulceration. Subsequently that did not healing she actually saw me the 1 time in November and then subsequently saw Dr. Leanord Hawkingobson through the  end of December where she apparently was healed. 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 This is on the right. With that being said right now she has a basically region on the lateral portion of the ray site where she is applying more pressure especially as her foot seems to be starting to turn in and this has become an increasingly significant issue for her to be honest. She does see Dr. Lajoyce Corners and Dr. Lajoyce Corners has had her on doxycycline for quite a bit of time here. Subsequently she is just now wrapping that out. I think that she probably would be benefited by continue the doxycycline for a time while we can work through what we need to do with things here. She does have evidence of osteomyelitis based on what I saw on the MRI today. This obviously is unfortunate and definitely not something that she was hoping to hear. With that being said I do believe that she would potentially be a strong candidate for hyperbaric oxygen therapy. Also believe she could be a candidate for total contact cast though we have to be cautious due to the fact that how her foot is bending inward I think that this is good to be a little bit of a concern. We will definitely have to keeping a close eye on things. She does have a history of diabetes mellitus type 2. She also other than the amputation has a medical history positive for hypertension. She has never undergone hyperbaric oxygen therapy previously I did show her the chamber we did talk a little bit about it today as well. Admission T [facility]: o 04/06/2021 Patient presents here in [place] today for evaluation. Again she is establishing care here after I saw her last week in [place]. Obviously I think that she is doing extremely well at this point which is great news and in general I am extremely pleased with where things stand from the standpoint of the appearance of the wound. Obviously this is not significantly smaller but does look a lot cleaner I think using the Hydrofera Blue has been of benefit over the past week. Nonetheless she still has a wound with issues with pressure here which I think is good to be an ongoing issue. Also think that the osteomyelitis which is chronic is also getting ongoing issue. She does want to try to do what she can to prevent this from worsening and ending with a below-knee amputation. T that end I do think that getting her into the hyperbaric oxygen chamber would be what we need to do. She has recently been seen by cardiology o and subsequently well as far as that is concerned. Her ejection fraction was appropriate for hyperbarics and everything seems to be doing great in that regard. She has not had a recent chest x-ray she is a former smoker around 15 years ago she quit. Nonetheless I do believe with her asthma we do want to do a chest x-ray just to make sure everything is okay before proceeding with the hyperbarics although we can go ahead and see about getting the approval. I also think she is going require some sharp debridement and around this wound we also have to contact her insurance for prior approval on this as well. 04/13/2021 upon evaluation today patient appears to be doing a little bit better in regard to her leg in  fact the swelling is dramatically better. Very pleased with where things stand in that regard. Fortunately there does not appear to be any signs of active infection at this time. No fevers, chills, nausea, vomiting, or diarrhea. 04/20/2021 upon evaluation today patient  appears to be doing decently well in regard to her foot. There is some need for sharp debridement here today. With that being said we will get a go ahead and proceed with that today as the foot is becoming somewhat macerated with the overhanging callus that is definitely not we want to see. With that being said the patient does have an issue here as well with a boil in the perineal region unfortunately that is an issue for her today as well. I did have a look at that as well. We are still working on dealing with insurance as far as getting hyperbarics approved. 04/27/2021 upon evaluation today patient appears to be doing somewhat poorly in general compared to where she has been. At this point she is having a lot of swelling which is the main concerning thing that I am seeing. There does not appear to be any signs of infection currently which is good news but at the same time I do feel like that she is a lot more swollen than she was even last week and is showing how much she is weeping in regard to the right lower leg. She also has a blister on the left breast although is not an open wound at this time. She continues to have the area in the perineum as well. 05/04/2021 upon evaluation today patient appears to actually be doing decently well in regard to her wound on the foot. Fortunately there is no signs of active infection at this time. No fevers, chills, nausea, vomiting, or diarrhea. Unfortunately she does have an area on the left breast which is actually appearing to be [name], [name] ([id number]) [id number] Page 8 of 13 a burn based on what I see physically. She does note that she uses rice bags for areas in general and may have left one in that area not realizing it was burning her as she does not really have much feeling. I think this is very probable based on what I am seeing. For that reason working to treat this as such I do think we need to loosen up a lot of the  necrotic tissue here. I Am going tosend in a prescription for Santyl for the patient. 9/1; patient presents for HBO today but cannot do treatment due to wheezing and feeling short of breath when laying down. She was set up as a doctor visit today since she was not able to do HBO. She states she feels okay overall and started having a dry cough over the past couple days. She has not tested herself for COVID. She denies fever/chills or sputum production. She thinks her symptoms are related to allergies. She has been using silver alginate to her right plantar foot wound. She has not been using Santyl to the breast wound because she reports forgetting to do this. She uses zinc oxide to her perennial wound. She currently denies systemic signs of infection. 9/8; patient presents for follow-up. She has been a unable to do HBO treatments for the past week due to her back pain. She is currently taking Flexeril to address this issue. She reports no issues with her compression wrap. She has been putting Santyl on the left breast wound and Monistat cream to the perennial  wound. She denies signs of infection. 9/15; patient presents for follow-up. Again she is unable to do HBO treatment today due to sinus pain. She has 2 new wounds to her right foot which she is unaware of. She has been putting Santyl on the left breast wound and zinc oxide to the perineal wound. She currently denies signs of infection. Readmission 10/17/2021 Ms. [name] is a 62 year old female with a past medical history of type 2 diabetes, osteomyelitis status post right BKA and morbid obesity that presents to the clinic for multiple scattered open wounds to her body. These are located to the abdomen, left posterior leg and sacral region. She has been using mupirocin ointment, Neosporin and zinc oxide to the wound beds. She has been started on Bactrim and Keflex by her primary care physician and states she has several days left to complete the  course. She currently denies systemic signs of infection. READMISSION This is a 62 year old morbidly obese type II diabetic (poorly controlled with last hemoglobin A1c 9.2%). She has congestive heart failure, peripheral vascular disease, hypertension, coronary artery disease, venous stasis, connective tissue disease (o Sjogren's syndrome), and bilateral lower extremity edema. She has been seen here for various reasons in the past. She has a right above-knee amputation. She was recently hospitalized for a left diabetic foot ulcer. Orthopedic surgery was consulted. An MRI was performed that was not conclusive for osteomyelitis, but given the extent of the necrosis, orthopedics recommended amputation. Infectious disease was also consulted and have recommended a 6-week course of oral doxycycline and Augmentin. Apparently wound care was also consulted while she was in the hospital and simply recommended painting the area with Betadine. She saw infectious disease on October 27 with plans to continue doxycycline on Augmentin to continue therapy until November 14. The infectious disease provider also recommended that the patient reconsider amputation. Her PCP referred her to the wound care center, as the patient is very reluctant to undergo amputation. She also has a small ulcer on her anterior tibial surface that recently opened after a minor trauma. On exam, her left foot is rolled such that the lateral aspect of her foot is the contact surface. There is a large ulcer with exposed necrotic muscle and fat. The wound does not probe to bone, but apparently there was bone exposure in the past while she was in the hospital. No malodor or purulent drainage. The wound on her anterior tibial surface is small with just a little slough accumulation. She has modest edema and skin changes consistent with stasis dermatitis. 07/28/2022: The anterior tibial wound is healed. The left plantar foot wound looks a little bit  better today. There is significantly less necrotic tissue present. There is an area at the most caudal aspect of the wound that looks like there is ongoing pressure-induced tissue injury. Bone remains covered. 08/11/2022: The wound has deteriorated quite a bit since her last visit. Her husband reports that she has had significantly more drainage, such that he has had to change the dressing multiple times per day. During the course of the visit, she recalled that she had not been taking her Lasix for at least 4 days. There is substantial periwound maceration and further tissue breakdown. There is necrotic tissue at the midportion of the wound and tendon is now exposed underneath this necrotic muscle. 08/17/2022: The wound looks better this week. There is still an area of necrotic muscle and tendon in the midfoot, but the forefoot has improved with some epithelium beginning  to come in from the margins. Unfortunately, there is evidence of ongoing pressure-induced tissue injury near the heel. The patient does admit to resting her heel on a stool frequently. She is still taking Bactrim as prescribed and her Keystone topical antibiotic compound should arrive at their home today. 12/14; fairly large sized wound on the left plantar foot. She has a right AKA. She uses a leg only for transfers she says she does not propel her wheelchair with that foot. She has been using Keystone, silver alginate and an Ace wrap. She has completed her Bactrim and Augmentin. She is a type II diabetic. The most worrisome part of this wound is a smaller area roughly 2 x 2 in the distal part of the wound which is 100% occupied by necrotic tendon 09/07/2022: She had an extensive debridement at her last visit. The wound is smaller today and all of the tissues appear healthier. 09/15/2022: The wound measurements are about the same, but overall the surface appears healthier. There is still a little bit of the nonviable tendon exposed  at the midfoot. 10/13/2022: The patient has been ill and unable to attend clinic for almost a month. Today, the wound measures smaller and there are buds of epithelium beginning to emerge, particularly at the calcaneus. There is a bridge of skin trying to come across the midportion of the wound, as well. Minimal slough accumulation. Patient History Information obtained from Patient. Family History Diabetes - Mother, Heart Disease - Mother,Father,Siblings, Hypertension - Mother,Father,Siblings, Lung Disease - Father, Stroke - Mother, No family history of Cancer, Hereditary Spherocytosis, Kidney Disease, Seizures, Thyroid Problems, Tuberculosis. Social History Former smoker - quit 15 years ago, Marital Status - Married, Alcohol Use - Never, Drug Use - No History, Caffeine Use - Rarely. Medical History Eyes Patient has history of Cataracts - both eyes Denies history of Glaucoma, Optic Neuritis Ear/Nose/Mouth/Throat Denies history of Chronic sinus problems/congestion, Middle ear problems Hematologic/Lymphatic Patient has history of Lymphedema Denies history of Anemia, Hemophilia, Human Immunodeficiency Virus, Sickle Cell Disease Respiratory Patient has history of Asthma Denies history of Aspiration, Chronic Obstructive Pulmonary Disease (COPD), Pneumothorax, Sleep Apnea, Tuberculosis Cardiovascular Patient has history of Congestive Heart Failure, Coronary Artery Disease, Hypertension, Peripheral Arterial Disease, Peripheral Venous Disease Denies history of Angina, Arrhythmia, Hypotension, Myocardial Infarction, Phlebitis, Vasculitis Gastrointestinal Denies history of Hepatitis A, Hepatitis B, Hepatitis C Grand, [name] ([id number]) [id number] Page 9 of 13 Endocrine Patient has history of Type II Diabetes Denies history of Type I Diabetes Genitourinary Denies history of End Stage Renal Disease Immunological Denies history of Lupus Erythematosus, Raynaudoos,  Scleroderma Integumentary (Skin) Denies history of History of Burn Musculoskeletal Patient has history of Rheumatoid Arthritis Denies history of Gout, Osteoarthritis, Osteomyelitis Neurologic Patient has history of Neuropathy Denies history of Dementia, Quadriplegia, Paraplegia, Seizure Disorder Oncologic Denies history of Received Chemotherapy, Received Radiation Psychiatric Denies history of Anorexia/bulimia, Confinement Anxiety Hospitalization/Surgery History - 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 Lajoyce Corners 01/2020. 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 - 11/22 R AKA. Medical A Surgical History Notes nd Constitutional Symptoms (General Health) hypothyroidism Cardiovascular cardiac stents Gastrointestinal GERD Endocrine Hypothyroidism Immunological Mix connective tissue disease- autoimmune Sjogren's syndrome Musculoskeletal Fibromyalgia, Scoliosis, Objective Constitutional no acute distress. Vitals Time Taken: 10:49 AM, Height: 62 in, Weight: 284 lbs, BMI: 51.9, Temperature: 97.7 F, Pulse: 85 bpm, Respiratory Rate: 22 breaths/min, Blood Pressure: 120/71 mmHg, Capillary Blood Glucose: 129 mg/dl. Respiratory Normal work of breathing on room air. General Notes: 10/13/2022: T oday, the  wound measures smaller and there are buds of epithelium beginning to emerge, particularly at the calcaneus. There is a bridge of skin trying to come across the midportion of the wound, as well. Minimal slough accumulation. Integumentary (Hair, Skin) Wound #19 status is Open. Original cause of wound was Gradually Appeared. The date acquired was: 05/17/2022. The wound has been in treatment 12 weeks. The wound is located on the [place]. The wound measures 10cm length x 2.8cm width x 0.3cm depth; 21.991cm^2 area and 6.597cm^3 volume. There is Fat Layer (Subcutaneous Tissue) exposed. There is no tunneling or undermining noted. There is a medium amount of serosanguineous drainage noted.  The wound margin is distinct with the outline attached to the wound base. There is large (67-100%) red, pink granulation within the wound bed. There is a small (1- 33%) amount of necrotic tissue within the wound bed including Adherent Slough. The periwound skin appearance exhibited: Callus, Dry/Scaly, Erythema. The periwound skin appearance did not exhibit: Crepitus, Excoriation, Induration, Rash, Scarring, Maceration, Atrophie Blanche, Cyanosis, Ecchymosis, Hemosiderin Staining, Mottled, Pallor, Rubor. The surrounding wound skin color is noted with erythema. Periwound temperature was noted as No Abnormality. 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 There was a Selective/Open Wound Non-Viable Tissue Debridement with a total area of 16 sq cm performed by Fredirick Maudlin, MD. With the following instrument(s): Curette to remove Non-Viable tissue/material. Material removed includes Slough and Biofilm and after achieving pain control using Lidocaine 5% topical ointment. No specimens were taken. A time out was conducted at 10:58, prior to the start of the procedure. A Minimum amount of bleeding was controlled with Pressure. The procedure was tolerated well. Post Debridement Measurements: 10cm length x 2.8cm width x 0.3cm depth; 6.597cm^3 volume. Character of Wound/Ulcer Post Debridement  is improved. Severity of Tissue Post Debridement is: Fat layer exposed. Post procedure Diagnosis Wound #19: Same as Pre-Procedure General Notes: scribed for Dr. Celine Ahr by [name], RN. Plan Follow-up Appointments: Return Appointment in 2 weeks. - Dr. Celine Ahr - room 2 Anesthetic: (In clinic) Topical Lidocaine 5% applied to wound bed Bathing/ Shower/ Hygiene: May shower and wash wound with soap and water. - with dressing changes Edema Control - Lymphedema / SCD / Other: Elevate legs to the level of the heart or above for 30 minutes daily and/or when sitting for 3-4 times a day throughout the day. Avoid standing for long periods of time. Off-Loading: Other: - Try and elevate Left leg throughout the day The following medication(s) was prescribed: lidocaine topical 5 % ointment ointment topical was prescribed at facility WOUND #19: - Foot Wound Laterality: Plantar, Left Cleanser: Soap and Water 1 x Per Day/30 Days Discharge Instructions: May shower and wash wound with dial antibacterial soap and water prior to dressing change. Cleanser: Wound Cleanser (Generic) 1 x Per Day/30 Days Discharge Instructions: Cleanse the wound with wound cleanser prior to applying a clean dressing using gauze sponges, not tissue or cotton balls. Prim Dressing: Sorbalgon AG Dressing 6x6 (in/in) (Generic) 1 x Per Day/30 Days  ary Discharge Instructions: Apply to wound bed as instructed Secondary Dressing: Woven Gauze Sponge, Non-Sterile 4x4 in 1 x Per Day/30 Days Discharge Instructions: Apply over primary dressing as directed. Secondary Dressing: Zetuvit Plus 4x8 in 1 x Per Day/30 Days Discharge Instructions: Apply over primary dressing as directed. Secured With: Elastic Bandage 4 inch (ACE bandage) (Generic) 1 x Per Day/30 Days Discharge Instructions: Secure with ACE bandage as directed. Secured With: [employer], 4.5x3.1 (in/yd) (Generic) 1 x Per Day/30 Days Discharge Instructions: Secure with  Kerlix as directed. Secured With: 55M Medipore Scientist, research (life sciences) Surgical T 2x10 (in/yd) (Generic) 1 x Per Day/30 Days ape Discharge Instructions: Secure with tape as directed. 10/13/2022: T oday, the wound measures smaller and there are buds of epithelium beginning to emerge, particularly at the calcaneus. There is a bridge of skin trying to come across the midportion of the wound, as well. Minimal slough accumulation. I used a curette to debride slough and biofilm from the wound surface. We will continue to use her Keystone topical antibiotic compound with silver alginate. Given the appearance of the wound and the excellent care she is receiving from her husband, I think we can stretch her appointments out to every 2 weeks, as transportation is somewhat difficult for her. Electronic Signature(s) Signed: 10/13/2022 11:09:41 AM By: Duanne Guess MD FACS Entered By: Duanne Guess on 10/13/2022 11:09:41 -------------------------------------------------------------------------------- HxROS Details Patient Name: Date of Service: [place], [name] 10/13/2022 10:45 A [name] Medical Record Number: [id number] Patient Account Number: [id number] Date of Birth/Sex: Treating RN: April 25, 1961 (62 y.o. F) Primary Care Provider: [name] Other Clinician: Referring Provider: Treating Provider/Extender: Ocie Doyne [name], [name] ([id number]) [id number] Page 11 of 13 Weeks in Treatment: 12 Information Obtained From Patient Constitutional Symptoms (General Health) Medical History: Past Medical History Notes: hypothyroidism Eyes Medical History: Positive for: Cataracts - both eyes Negative for: Glaucoma; Optic Neuritis Ear/Nose/Mouth/Throat Medical History: Negative for: Chronic sinus problems/congestion; Middle ear problems Hematologic/Lymphatic Medical History: Positive for: Lymphedema Negative for: Anemia; Hemophilia; Human Immunodeficiency  Virus; Sickle Cell Disease Respiratory Medical History: Positive for: Asthma Negative for: Aspiration; Chronic Obstructive Pulmonary Disease (COPD); Pneumothorax; Sleep Apnea; Tuberculosis Cardiovascular Medical History: Positive for: Congestive Heart Failure; Coronary Artery Disease; Hypertension; Peripheral Arterial Disease; Peripheral Venous Disease Negative for: Angina; Arrhythmia; Hypotension; Myocardial Infarction; Phlebitis; Vasculitis Past Medical History Notes: cardiac stents Gastrointestinal Medical History: Negative for: Hepatitis A; Hepatitis B; Hepatitis C Past Medical History Notes: GERD Endocrine Medical History: Positive for: Type II Diabetes Negative for: Type I Diabetes Past Medical History Notes: Hypothyroidism Time with diabetes: since 2002 Treated with: Insulin, Oral agents Blood sugar tested every day: Yes Tested : 2-3 times a day Genitourinary Medical History: Negative for: End Stage Renal Disease Immunological Medical History: Negative for: Lupus Erythematosus; Raynauds; Scleroderma 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 Integumentary (Skin) Medical History: Negative for: History of Burn Musculoskeletal [name], [name] ([id number]) [id number] Page 12 of 13 Medical History: Positive for: Rheumatoid Arthritis Negative for: Gout; Osteoarthritis; Osteomyelitis Past Medical History Notes: Fibromyalgia, Scoliosis, Neurologic Medical History: Positive for: Neuropathy Negative for: Dementia; Quadriplegia; Paraplegia; Seizure Disorder Oncologic Medical History: Negative for: Received Chemotherapy; Received Radiation Psychiatric Medical History: Negative for: Anorexia/bulimia; Confinement Anxiety HBO Extended History Items Eyes: Cataracts Immunizations Pneumococcal Vaccine: Received Pneumococcal Vaccination: Yes Received Pneumococcal Vaccination On or After 60th  Birthday: Yes Implantable Devices None Hospitalization / Surgery History Type of Hospitalization/Surgery 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 Lajoyce Corners 01/2020 05/2021 R  AKA 06/29/2021 revision R BKA 11/22 R AKA Family and Social History Cancer: No; Diabetes: Yes - Mother; Heart Disease: Yes - Mother,Father,Siblings; Hereditary Spherocytosis: No; Hypertension: Yes - Mother,Father,Siblings; Kidney Disease: No; Lung Disease: Yes - Father; Seizures: No; Stroke: Yes - Mother; Thyroid Problems: No; Tuberculosis: No; Former smoker - quit 15 years ago; Marital Status - Married; Alcohol Use: Never; Drug Use: No History; Caffeine Use: Rarely; Financial Concerns: No; Food, Clothing or Shelter Needs: No; Support System Lacking: No; Transportation Concerns: No Electronic Signature(s) Signed: 10/13/2022 11:24:18 AM By: Fredirick Maudlin MD FACS Entered By: Fredirick Maudlin on 10/13/2022 11:05:17 -------------------------------------------------------------------------------- SuperBill Details Patient Name: Date of Service: [place], [name] 10/13/2022 Medical Record Number: [id number] Patient Account Number: [id number] Date of Birth/Sex: Treating RN: [birth year] (62 y.o. 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 [name], [name] ([id number]) [id number] Page 13 of 13 M86.9 Osteomyelitis, unspecified E11.621 Type 2 diabetes mellitus with foot ulcer E66.01 Morbid (severe) obesity due to excess calories M35.00 Sjogren syndrome, unspecified I73.9 Peripheral vascular disease, unspecified S50.53 Chronic diastolic (congestive) heart failure 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 Facility Procedures : 7 CPT4 Code:  9767341 Description: 93790 - DEBRIDE WOUND 1ST 20 SQ CM OR < ICD-10 Diagnosis Description 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 Modifier: Quantity: 1 Physician Procedures : CPT4 Code Description Modifier [id number] - WC PHYS LEVEL 4 - EST PT 25 ICD-10 Diagnosis Description 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

## 2022-10-16 ENCOUNTER — Ambulatory Visit: Payer: Commercial Managed Care - HMO

## 2022-10-16 ENCOUNTER — Telehealth: Payer: Self-pay | Admitting: Family Medicine

## 2022-10-16 NOTE — Telephone Encounter (Signed)
Patient had reached out to me last week to schedule this appointment to go over how to use Continuous Glucose Monitor. Will send MyChart message that she can call back to reschedule at her convenience.

## 2022-10-16 NOTE — Telephone Encounter (Signed)
Pt called in to cancel CCM appt. Advised pt that they may get a call back to reschedule. Pt acknowledged understanding.

## 2022-10-23 NOTE — Progress Notes (Deleted)
Office Visit Note  Patient: [name]             Date of Birth: Apr 27, 1961           MRN: [id number]             PCP: Shelda Pal, DO Referring: Shelda Pal* Visit Date: 11/06/2022 Occupation: @GUAROCC$ @  Subjective:  No chief complaint on file.   History of Present Illness: [name] is a 62 y.o. female ***     Activities of Daily Living:  Patient reports morning stiffness for *** {minute/hour:19697}.   Patient {ACTIONS;DENIES/REPORTS:21021675::"Denies"} nocturnal pain.  Difficulty dressing/grooming: {ACTIONS;DENIES/REPORTS:21021675::"Denies"} Difficulty climbing stairs: {ACTIONS;DENIES/REPORTS:21021675::"Denies"} Difficulty getting out of chair: {ACTIONS;DENIES/REPORTS:21021675::"Denies"} Difficulty using hands for taps, buttons, cutlery, and/or writing: {ACTIONS;DENIES/REPORTS:21021675::"Denies"}  No Rheumatology ROS completed.   PMFS History:  Patient Active Problem List   Diagnosis Date Noted   Osteomyelitis ([place]) 07/07/2022   Medication management 07/07/2022   Diabetic ulcer of left midfoot associated with diabetes mellitus due to underlying condition, with necrosis of muscle ([place])    Diabetic foot infection ([place]) 06/14/2022   Sacral decubitus ulcer, stage II ([name]) 12/24/2021   Class 3 obesity ([place]) 12/23/2021   Iron deficiency anemia 12/23/2021   Acute on chronic diastolic CHF (congestive heart failure) ([place]) 12/22/2021   Hyperkalemia 12/22/2021   Left leg cellulitis 12/20/2021   Peripheral vascular disease, unspecified ([place]) 10/10/2021   Dehiscence of amputation stump (HCC)    Acute blood loss anemia 06/24/2021   Postoperative wound infection 06/24/2021   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 ([place])    Bacteremia due to Enterococcus 05/28/2021   Digestive disorder 05/18/2021   Bilateral lower extremity edema 05/02/2021   Statin myopathy 03/04/2021   Aortic stenosis 03/04/2021   Sjogren's syndrome  ([place]) 02/11/2020   Cutaneous abscess of right foot    Statin intolerance 08/16/2019   Gram-negative bacteremia 10/18/2018   Streptococcal bacteremia 10/18/2018   CAD S/P percutaneous coronary angioplasty 04/19/2018   Essential hypertension    Moderate persistent asthma 03/21/2018   NAFLD (nonalcoholic fatty liver disease)    Recurrent cellulitis of lower extremity 01/02/2018   Cellulitis of lower leg 01/02/2018   Chronic diarrhea 05/08/2017   H/O Clostridium difficile infection 05/08/2017   Rectal bleeding 05/08/2017   Hyperbilirubinemia 04/29/2016   Hyponatremia 04/29/2016   Cellulitis of right foot 03/09/2016   Allergic rhinitis due to pollen 02/15/2016   Anaphylactic reaction due to food 02/10/2016   Type 2 diabetes mellitus with hyperglycemia, with long-term current use of insulin ([place]) 02/10/2016   Gastroesophageal reflux disease without esophagitis 02/10/2016   Atopic eczema 02/10/2016   Cervical nerve root disorder 12/15/2015   Lumbar radiculopathy 12/15/2015   Daytime somnolence 11/26/2015   Venous stasis dermatitis of both lower extremities 02/11/2015   Morbid obesity ([place]) 06/19/2014   Abnormal LFTs 03/26/2014   B-complex deficiency 03/26/2014   Benign essential HTN 03/26/2014   Chronic pain associated with significant psychosocial dysfunction 03/26/2014   Diaphragmatic hernia 03/26/2014   Gastroesophageal reflux disease 03/26/2014   Hammer toe 03/26/2014   H/O neoplasm 03/26/2014   Adaptive colitis 03/26/2014   Deafness, sensorineural 03/26/2014   Fibromyalgia 01/20/2014   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/20/2014   Hyperlipidemia 11/11/2012   Mixed connective tissue disease ([place]) 11/11/2012   Hypothyroidism 99991111   Uncomplicated asthma 99991111   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[place]) 02/20/2012   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     Eczema    Excessive daytime sleepiness 11/26/2015   Fibromyalgia    Gallstones    GERD (gastroesophageal reflux disease)    Heart murmur    "noted for the 1st time on 04/18/2018"   History of blood transfusion 07/2010   "S/P oophorectomy"   History of gout    History of hiatal hernia 1980s   "gone now" (04/18/2018)   Hyperlipidemia    Hypertension    takes Metoprolol and Enalapril daily   Hypothyroidism    takes Synthroid daily   IBS (irritable bowel syndrome)    Migraine    "nothing in the 2000s" (04/18/2018)   Mixed connective tissue disease ([place])    NAFLD (nonalcoholic fatty liver disease)    Pneumonia    "several times" (04/18/2018)   PVC's (premature ventricular contractions)    noted on event monitor 02/2021   Rheumatoid arthritis ([place])    "hands, elbows, shoulders, probably knees" (04/18/2018)   Scoliosis    Sjogren's syndrome ([place])    Sleep apnea    mild - does not use cpap   Spondylosis    Type II diabetes mellitus ([place])    takes Metformin and Hum R daily (04/18/2018)   Walker as ambulation aid    also uses wheelchair    Family History  Problem Relation Age of Onset   CAD Mother    Hypertension Mother    Heart attack Mother    Stroke Mother    CAD Father    Heart attack Father    Allergic rhinitis Father    Asthma Father    Hypertension Brother    Hypertension Brother    Pancreatic cancer Paternal Aunt    Breast cancer Paternal Aunt    Lung cancer Paternal Aunt    Allergic rhinitis Son    Asthma  Son    Past Surgical History:  Procedure Laterality Date   ABDOMINAL HYSTERECTOMY  06/2005   "w/right ovariy"   AMPUTATION Right 01/28/2020    RIGHT FOURTH AND FIFTH RAY AMPUTATION   AMPUTATION Right 01/28/2020   Procedure: RIGHT FOURTH AND FIFTH RAY AMPUTATION,;  Surgeon: Newt Minion, MD;  Location: [facility];  Service: Orthopedics;  Laterality: Right;   AMPUTATION Right 06/01/2021   Procedure: RIGHT BELOW KNEE AMPUTATION;  Surgeon: Newt Minion, MD;  Location: [facility];  Service: Orthopedics;  Laterality: Right;   AMPUTATION Right 07/22/2021   Procedure: RIGHT ABOVE KNEE AMPUTATION;  Surgeon: Newt Minion, MD;  Location: [facility];  Service: Orthopedics;  Laterality: Right;   APPENDECTOMY     BREAST BIOPSY Bilateral    9 total (04/18/2018)   CORONARY ANGIOPLASTY WITH STENT PLACEMENT  10/01/2015   normal LM, 30% LAD, 85% mid RCA and 95% distal RCA s/p PCI of the mid to distal RCA and now on DAPT  with ASA and Ticagrelor.     CORONARY STENT INTERVENTION Right 04/18/2018   Procedure: CORONARY STENT INTERVENTION;  Surgeon: Burnell Blanks, MD;  Location: [facility];  Service: Cardiovascular;  Laterality: Right;   DILATION AND CURETTAGE OF UTERUS     FRACTURE SURGERY     LAPAROSCOPIC CHOLECYSTECTOMY     LEFT HEART CATH AND CORONARY ANGIOGRAPHY N/A 04/18/2018   Procedure: LEFT HEART CATH AND CORONARY ANGIOGRAPHY;  Surgeon: Burnell Blanks, MD;  Location: [facility];  Service: Cardiovascular;  Laterality: N/A;   MUSCLE BIOPSY Left    "leg"   OOPHORECTOMY  07/2010   RADIOLOGY WITH ANESTHESIA N/A 05/25/2016   Procedure: RADIOLOGY WITH ANESTHESIA;  Surgeon: Medication Radiologist, MD;  Location: [facility];  Service: Radiology;  Laterality: N/A;   STUMP REVISION Right 06/29/2021   Procedure: REVISION RIGHT BELOW KNEE AMPUTATION;  Surgeon: Newt Minion, MD;  Location: [facility];  Service: Orthopedics;  Laterality: Right;   TEE WITHOUT CARDIOVERSION N/A 06/01/2021   Procedure:  TRANSESOPHAGEAL ECHOCARDIOGRAM (TEE);  Surgeon: Geralynn Rile, MD;  Location: [facility];  Service: Cardiovascular;  Laterality: N/A;   TONSILLECTOMY AND ADENOIDECTOMY     WRIST FRACTURE SURGERY Left    "crushed it"   Social History   Social History Narrative   Not on file   Immunization History  Administered Date(s) Administered   Influenza Split 06/19/2012, 07/15/2013   Influenza,inj,Quad PF,6+ Mos 06/16/2015, 06/30/2015   Influenza,inj,quad, With Preservative 06/19/2014   Influenza-Unspecified 06/19/2012, 07/15/2013   Pneumococcal Polysaccharide-23 02/25/2014   Tdap 02/17/2013, 02/25/2014     Objective: Vital Signs: There were no vitals taken for this visit.   Physical Exam   Musculoskeletal Exam: ***  CDAI Exam: CDAI Score: -- Patient Global: --; Provider Global: -- Swollen: --; Tender: -- Joint Exam 11/06/2022   No joint exam has been documented for this visit   There is currently no information documented on the homunculus. Go to the Rheumatology activity and complete the homunculus joint exam.  Investigation: No additional findings.  Imaging: No results found.  Recent Labs: Lab Results  Component Value Date   WBC 7.4 07/05/2022   HGB 9.8 Repeated and verified X2. (L) 07/05/2022   PLT 317.0 07/05/2022   NA 139 07/05/2022   K 3.9 07/05/2022   CL 97 07/05/2022   CO2 30 07/05/2022   GLUCOSE 224 (H) 07/05/2022   BUN 11 07/05/2022   CREATININE 0.66 07/05/2022   BILITOT 0.3 06/14/2022   ALKPHOS 69 06/14/2022   AST 32 06/14/2022   ALT 22 06/14/2022   PROT 7.4 06/14/2022   ALBUMIN 3.0 (L) 06/14/2022   CALCIUM 9.5 07/05/2022   GFRAA 87 05/04/2020    Speciality Comments: No specialty comments available.  Procedures:  No procedures performed Allergies: Fish allergy, Fish oil, Omega-3 fatty acids, Victoza [liraglutide], Crestor [rosuvastatin], Gabapentin, Lovastatin, Metronidazole, Red yeast rice [cholestin], Rotigotine, Atrovent hfa [ipratropium  bromide hfa], Iodine, Morphine, Other, Pepto-bismol [bismuth subsalicylate], Enalapril, Suprep [na sulfate-k sulfate-mg sulf], and Tape   Assessment / Plan:     Visit Diagnoses: 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HCC)  High risk medication use  Primary osteoarthritis of both hands  Unilateral AKA, right (HCC)  Dehiscence of amputation stump (HCC)  Recurrent cellulitis of lower extremity  Primary osteoarthritis of left foot  DDD (degenerative disc disease), lumbar  Sacral decubitus ulcer, stage II (HCC)  Fibromyalgia  Chronic pain associated with significant psychosocial dysfunction  Peripheral vascular disease, unspecified ([place])  Streptococcal  bacteremia  Essential hypertension  CAD S/P percutaneous coronary angioplasty  Type 2 diabetes mellitus with hyperglycemia, with long-term current use of insulin (HCC)  Moderate persistent asthma with acute exacerbation  Acute on chronic diastolic CHF (congestive heart failure) (HCC)  NAFLD (nonalcoholic fatty liver disease)  Gastroesophageal reflux disease without esophagitis  Adaptive colitis  History of hypothyroidism  Deafness, sensorineural  H/O Clostridium difficile infection  Other atopic dermatitis  Orders: No orders of the defined types were placed in this encounter.  No orders of the defined types were placed in this encounter.   Face-to-face time spent with patient was *** minutes. Greater than 50% of time was spent in counseling and coordination of care.  Follow-Up Instructions: No follow-ups on file.   Ofilia Neas, PA-C  Note - This record has been created using Dragon software.  Chart creation errors have been sought, but may not always  have been located. Such creation errors do not reflect on  the standard of medical care.

## 2022-10-26 ENCOUNTER — Encounter (HOSPITAL_BASED_OUTPATIENT_CLINIC_OR_DEPARTMENT_OTHER): Payer: Commercial Managed Care - HMO | Admitting: General Surgery

## 2022-10-26 DIAGNOSIS — E11621 Type 2 diabetes mellitus with foot ulcer: Secondary | ICD-10-CM | POA: Diagnosis not present

## 2022-10-27 ENCOUNTER — Encounter: Payer: Commercial Managed Care - HMO | Attending: Registered Nurse | Admitting: Registered Nurse

## 2022-10-27 ENCOUNTER — Encounter: Payer: Self-pay | Admitting: Registered Nurse

## 2022-10-27 VITALS — BP 145/73 | HR 83

## 2022-10-27 DIAGNOSIS — Z89611 Acquired absence of right leg above knee: Secondary | ICD-10-CM | POA: Insufficient documentation

## 2022-10-27 DIAGNOSIS — M2550 Pain in unspecified joint: Secondary | ICD-10-CM | POA: Diagnosis present

## 2022-10-27 DIAGNOSIS — M7061 Trochanteric bursitis, right hip: Secondary | ICD-10-CM | POA: Insufficient documentation

## 2022-10-27 DIAGNOSIS — M7062 Trochanteric bursitis, left hip: Secondary | ICD-10-CM | POA: Diagnosis present

## 2022-10-27 DIAGNOSIS — G894 Chronic pain syndrome: Secondary | ICD-10-CM | POA: Insufficient documentation

## 2022-10-27 DIAGNOSIS — M25512 Pain in left shoulder: Secondary | ICD-10-CM | POA: Insufficient documentation

## 2022-10-27 DIAGNOSIS — Z79899 Other long term (current) drug therapy: Secondary | ICD-10-CM | POA: Diagnosis present

## 2022-10-27 DIAGNOSIS — G8929 Other chronic pain: Secondary | ICD-10-CM | POA: Diagnosis present

## 2022-10-27 DIAGNOSIS — Z5181 Encounter for therapeutic drug level monitoring: Secondary | ICD-10-CM | POA: Diagnosis present

## 2022-10-27 DIAGNOSIS — M5416 Radiculopathy, lumbar region: Secondary | ICD-10-CM | POA: Insufficient documentation

## 2022-10-27 DIAGNOSIS — M25511 Pain in right shoulder: Secondary | ICD-10-CM | POA: Insufficient documentation

## 2022-10-27 MED ORDER — OXYCODONE HCL 20 MG PO TABS: 1.0000 | ORAL_TABLET | Freq: Three times a day (TID) | ORAL | 0 refills | Status: DC | PRN

## 2022-10-27 NOTE — Progress Notes (Signed)
Subjective:    Patient ID: [name], female    DOB: [birth year], 62 y.o.   MRN: [id number]  HPI: [name] is a 62 y.o. female who returns for follow up appointment for chronic pain and medication refill. She states her pain is located in her bilateral shoulders, lower back pain radiating into her left lower extremity and generalized joint pain.   [name] reports she has a ulcer in her perineum area, her husband states he is keeping area clea, she will follow up with wound care, she verbalizes understanding.   [name] Morphine equivalent is 90.00 MME.   Last Oral Swab was Performed on 06/27/2022, it was consistent.    Pain Inventory Average Pain 7 Pain Right Now 7 My pain is sharp, burning, dull, stabbing, tingling, and aching  In the last 24 hours, has pain interfered with the following? General activity 9 Relation with others 9 Enjoyment of life 2 What TIME of day is your pain at its worst? morning  and night Sleep (in general) Poor  Pain is worse with: bending and sitting Pain improves with: rest, heat/ice, therapy/exercise, medication, and injections Relief from Meds: 7  Family History  Problem Relation Age of Onset   CAD Mother    Hypertension Mother    Heart attack Mother    Stroke Mother    CAD Father    Heart attack Father    Allergic rhinitis Father    Asthma Father    Hypertension Brother    Hypertension Brother    Pancreatic cancer Paternal Aunt    Breast cancer Paternal Aunt    Lung cancer Paternal Aunt    Allergic rhinitis Son    Asthma Son    Social History   Socioeconomic History   Marital status: Married    Spouse name: [name] Lamarca   Number of children: 1   Years of education: 11   Highest education level: GED or equivalent  Occupational History    Comment: Not currrently working ( last worked 2004)  Tobacco Use   Smoking status: Former    Packs/day: 1.00    Years: 19.00    Total pack years: 19.00    Types: Cigarettes     Start date: 18    Quit date: 02/11/2004    Years since quitting: 18.7    Passive exposure: Past   Smokeless tobacco: Never  Vaping Use   Vaping Use: Never used  Substance and Sexual Activity   Alcohol use: Yes    Comment: RARE Wine   Drug use: Not Currently    Types: Marijuana    Comment: "only in my teens"   Sexual activity: Yes    Birth control/protection: Surgical    Comment: Hysterectomy  Other Topics Concern   Not on file  Social History Narrative   Not on file   Social Determinants of Health   Financial Resource Strain: Low Risk  (12/27/2021)   Overall Financial Resource Strain (CARDIA)    Difficulty of Paying Living Expenses: Not hard at all  Food Insecurity: No Food Insecurity (06/14/2022)   Hunger Vital Sign    Worried About Running Out of Food in the Last Year: Never true    Ran Out of Food in the Last Year: Never true  Transportation Needs: No Transportation Needs (06/14/2022)   PRAPARE - Hydrologist (Medical): No    Lack of Transportation (Non-Medical): No  Physical Activity: Inactive (08/25/2021)   Exercise  Vital Sign    Days of Exercise per Week: 0 days    Minutes of Exercise per Session: 0 min  Stress: Stress Concern Present (08/25/2021)   [place]    Feeling of Stress : Very much  Social Connections: Moderately Integrated (08/25/2021)   Social Connection and Isolation Panel [NHANES]    Frequency of Communication with Friends and Family: More than three times a week    Frequency of Social Gatherings with Friends and Family: More than three times a week    Attends Religious Services: More than 4 times per year    Active Member of [employer] or Organizations: No    Attends Archivist Meetings: Never    Marital Status: Married   Past Surgical History:  Procedure Laterality Date   ABDOMINAL HYSTERECTOMY  06/2005   "w/right ovariy"   AMPUTATION Right  01/28/2020    RIGHT FOURTH AND FIFTH RAY AMPUTATION   AMPUTATION Right 01/28/2020   Procedure: RIGHT FOURTH AND FIFTH RAY AMPUTATION,;  Surgeon: Newt Minion, MD;  Location: [facility];  Service: Orthopedics;  Laterality: Right;   AMPUTATION Right 06/01/2021   Procedure: RIGHT BELOW KNEE AMPUTATION;  Surgeon: Newt Minion, MD;  Location: [facility];  Service: Orthopedics;  Laterality: Right;   AMPUTATION Right 07/22/2021   Procedure: RIGHT ABOVE KNEE AMPUTATION;  Surgeon: Newt Minion, MD;  Location: [facility];  Service: Orthopedics;  Laterality: Right;   APPENDECTOMY     BREAST BIOPSY Bilateral    9 total (04/18/2018)   CORONARY ANGIOPLASTY WITH STENT PLACEMENT  10/01/2015   normal LM, 30% LAD, 85% mid RCA and 95% distal RCA s/p PCI of the mid to distal RCA and now on DAPT with ASA and Ticagrelor.     CORONARY STENT INTERVENTION Right 04/18/2018   Procedure: CORONARY STENT INTERVENTION;  Surgeon: Burnell Blanks, MD;  Location: [facility];  Service: Cardiovascular;  Laterality: Right;   DILATION AND CURETTAGE OF UTERUS     FRACTURE SURGERY     LAPAROSCOPIC CHOLECYSTECTOMY     LEFT HEART CATH AND CORONARY ANGIOGRAPHY N/A 04/18/2018   Procedure: LEFT HEART CATH AND CORONARY ANGIOGRAPHY;  Surgeon: Burnell Blanks, MD;  Location: [facility];  Service: Cardiovascular;  Laterality: N/A;   MUSCLE BIOPSY Left    "leg"   OOPHORECTOMY  07/2010   RADIOLOGY WITH ANESTHESIA N/A 05/25/2016   Procedure: RADIOLOGY WITH ANESTHESIA;  Surgeon: Medication Radiologist, MD;  Location: [facility];  Service: Radiology;  Laterality: N/A;   STUMP REVISION Right 06/29/2021   Procedure: REVISION RIGHT BELOW KNEE AMPUTATION;  Surgeon: Newt Minion, MD;  Location: [facility];  Service: Orthopedics;  Laterality: Right;   TEE WITHOUT CARDIOVERSION N/A 06/01/2021   Procedure: TRANSESOPHAGEAL ECHOCARDIOGRAM (TEE);  Surgeon: Geralynn Rile, MD;  Location: [facility];  Service: Cardiovascular;  Laterality: N/A;    TONSILLECTOMY AND ADENOIDECTOMY     WRIST FRACTURE SURGERY Left    "crushed it"   Past Surgical History:  Procedure Laterality Date   ABDOMINAL HYSTERECTOMY  06/2005   "w/right ovariy"   AMPUTATION Right 01/28/2020    RIGHT FOURTH AND FIFTH RAY AMPUTATION   AMPUTATION Right 01/28/2020   Procedure: RIGHT FOURTH AND FIFTH RAY AMPUTATION,;  Surgeon: Newt Minion, MD;  Location: [facility];  Service: Orthopedics;  Laterality: Right;   AMPUTATION Right 06/01/2021   Procedure: RIGHT BELOW KNEE AMPUTATION;  Surgeon: Newt Minion, MD;  Location:  [facility];  Service: Orthopedics;  Laterality: Right;   AMPUTATION Right 07/22/2021   Procedure: RIGHT ABOVE KNEE AMPUTATION;  Surgeon: Newt Minion, MD;  Location: [facility];  Service: Orthopedics;  Laterality: Right;   APPENDECTOMY     BREAST BIOPSY Bilateral    9 total (04/18/2018)   CORONARY ANGIOPLASTY WITH STENT PLACEMENT  10/01/2015   normal LM, 30% LAD, 85% mid RCA and 95% distal RCA s/p PCI of the mid to distal RCA and now on DAPT with ASA and Ticagrelor.     CORONARY STENT INTERVENTION Right 04/18/2018   Procedure: CORONARY STENT INTERVENTION;  Surgeon: Burnell Blanks, MD;  Location: [facility];  Service: Cardiovascular;  Laterality: Right;   DILATION AND CURETTAGE OF UTERUS     FRACTURE SURGERY     LAPAROSCOPIC CHOLECYSTECTOMY     LEFT HEART CATH AND CORONARY ANGIOGRAPHY N/A 04/18/2018   Procedure: LEFT HEART CATH AND CORONARY ANGIOGRAPHY;  Surgeon: Burnell Blanks, MD;  Location: [facility];  Service: Cardiovascular;  Laterality: N/A;   MUSCLE BIOPSY Left    "leg"   OOPHORECTOMY  07/2010   RADIOLOGY WITH ANESTHESIA N/A 05/25/2016   Procedure: RADIOLOGY WITH ANESTHESIA;  Surgeon: Medication Radiologist, MD;  Location: [facility];  Service: Radiology;  Laterality: N/A;   STUMP REVISION Right 06/29/2021   Procedure: REVISION RIGHT BELOW KNEE AMPUTATION;  Surgeon: Newt Minion, MD;  Location: [facility];  Service: Orthopedics;   Laterality: Right;   TEE WITHOUT CARDIOVERSION N/A 06/01/2021   Procedure: TRANSESOPHAGEAL ECHOCARDIOGRAM (TEE);  Surgeon: Geralynn Rile, MD;  Location: [facility];  Service: Cardiovascular;  Laterality: N/A;   TONSILLECTOMY AND ADENOIDECTOMY     WRIST FRACTURE SURGERY Left    "crushed it"   Past Medical History:  Diagnosis Date   Aortic stenosis    mild AS by echo 02/2021   Asthma    "on daily RX and rescue inhaler" (04/18/2018)   Chronic diastolic CHF (congestive heart failure) ([place]) 09/2015   Chronic lower back pain    Chronic neck pain    Chronic pain syndrome    Fentanyl Patch   Colon polyps    Coronary artery disease    cath with normal LM, 30% LAD, 85% mid RCA and 95% distal RCA s/p PCI of the mid to distal RCA and now on DAPT with ASA and Ticagrelor.     Eczema    Excessive daytime sleepiness 11/26/2015   Fibromyalgia    Gallstones    GERD (gastroesophageal reflux disease)    Heart murmur    "noted for the 1st time on 04/18/2018"   History of blood transfusion 07/2010   "S/P oophorectomy"   History of gout    History of hiatal hernia 1980s   "gone now" (04/18/2018)   Hyperlipidemia    Hypertension    takes Metoprolol and Enalapril daily   Hypothyroidism    takes Synthroid daily   IBS (irritable bowel syndrome)    Migraine    "nothing in the 2000s" (04/18/2018)   Mixed connective tissue disease ([place])    NAFLD (nonalcoholic fatty liver disease)    Pneumonia    "several times" (04/18/2018)   PVC's (premature ventricular contractions)    noted on event monitor 02/2021   Rheumatoid arthritis ([place])    "hands, elbows, shoulders, probably knees" (04/18/2018)   Scoliosis    Sjogren's syndrome ([place])    Sleep apnea    mild - does not use cpap   Spondylosis  Type II diabetes mellitus ([place])    takes Metformin and Hum R daily (04/18/2018)   Walker as ambulation aid    also uses wheelchair   There were no vitals taken for this visit.  Opioid Risk Score:   Fall Risk Score:   `1  Depression screen [facility] 2/9     08/22/2022   11:21 AM 07/07/2022    9:16 AM 06/27/2022   11:39 AM 05/02/2022   11:56 AM 03/30/2022   11:08 AM 02/27/2022    1:37 PM 01/31/2022   10:18 AM  Depression screen PHQ 2/9  Decreased Interest 1 0 1 1 1 1 $ 0  Down, Depressed, Hopeless 1 1 1 1 1 1 $ 0  PHQ - 2 Score 2 1 2 2 2 2 $ 0      Review of Systems  Musculoskeletal:  Positive for back pain.       Bilateral shoulder pain Bilateral hand pain Left foot pain  All other systems reviewed and are negative.     Objective:   Physical Exam Vitals and nursing note reviewed.  Constitutional:      Appearance: Normal appearance.  Cardiovascular:     Rate and Rhythm: Normal rate and regular rhythm.     Pulses: Normal pulses.     Heart sounds: Normal heart sounds.  Pulmonary:     Effort: Pulmonary effort is normal.     Breath sounds: Normal breath sounds.  Musculoskeletal:     Cervical back: Normal range of motion and neck supple.     Comments: Normal Muscle Bulk and Muscle Testing Reveals:  Upper Extremities: Decreased ROM Right:  90 Degres and Left 45 Degrees and Muscle Strength 4/5 Lumbar Paraspinal Tenderness: L-3-L-5 Lower Extremities: Right: AKA Left Lower Extremity: Decreased ROM and Muscle Strength 5/5 Left Lower Extremity Flexion Produces Pain into her  Lumbar, Left Lower Extremity and Left Foot Arrived in wheelchair     Skin:    General: Skin is warm and dry.  Neurological:     Mental Status: She is alert and oriented to person, place, and time.  Psychiatric:        Mood and Affect: Mood normal.        Behavior: Behavior normal.         Assessment & Plan:  HX: of Right AKA: Dr Sharol Given Following. Continue to Monitor. 10/27/2022 Diabetic Polyneuropathy associated with Type 2 DM: Continue current medication regimen with Lyrica. Continue to Monitor. 10/27/2022 DM Type 2: PCP Following. Continue current medication regimen. Continue to Monitor. 10/27/2022 Chronic  Pain Syndrome :  Refilled: Oxycodone 20 mg one tablet every 8 hours as needed for pain #90.  We will continue the opioid monitoring program, this consists of regular clinic visits, examinations, urine drug screen, pill counts as well as use of [place] Controlled Substance Reporting system. A 12 month History has been reviewed on the [place] Controlled Substance Reporting System on 10/27/2022 Stage 1 Decubitus: Left Buttock and Left Lower Extremity: Continue Adequate Nutrition and Weight Shifting.  Wound Care following. Continue to Monitor. 10/27/2022  6. Cellulitis: Ms. Mcnulty was hospitalized on 06/14/2022- 06/20/2022, discharge summary was reviewed.  Continue to Monitpr. PCP Following.10/27/2022 7. Phantom Pain: Continue Gabapentin  Continue to Monitor. 10/27/2022 8. Polyarthralgia: Continue HEP as Tolerated. Continue to Monitor. 10/27/2022 9. Chronic Bilateral Shoulder Pain: Continue HEP as tolerated. Continue to Monitor.10/27/2022  10. Chronic Bilateral Lower Back Pain without Sciatica: Continue Lidocaine Patches 12 hours on and 12 hours off, she verbalizes understanding. Continue  HEP as tolerated. Continue current medication regimen. Continue to Monitor. 10/27/2022 11. Greater Trochanter Bursitis: Continue to Alternate Ice and Heat Therapy. Continue to Monitor. 10/27/2022 12. Left Foot Ulcer: Husband performing dressing changes. Wound Care Following and Dr Sharol Given Following. Continue to Monitor. 10/27/2022

## 2022-10-27 NOTE — Progress Notes (Signed)
Ridgeway, Moxie [name] ([id number]) [id number] Page 1 of 13 Visit Report for 10/26/2022 Chief Complaint Document Details Patient Name: Date of Service: [name], [name] 10/26/2022 10:15 A [name] Medical Record Number: [id number] Patient Account Number: [id number] Date of Birth/Sex: Treating RN: 02/06/1961 (62 y.o. F) Primary Care Provider: Riki [name] Other Clinician: Referring Provider: Treating Provider/Extender: [name] in Treatment: 14 Information Obtained from: Patient Chief Complaint 10/17/2021; multiple scattered wounds to the abdomen, posterior left leg, left labia and sacrum 07/19/2022: left foot DFU Electronic Signature(s) Signed: 10/26/2022 11:14:16 AM By: [name] Maudlin MD FACS Entered By: [name] on 10/26/2022 11:14:16 -------------------------------------------------------------------------------- Debridement Details Patient Name: Date of Service: [name], [name] 10/26/2022 10:15 A [name] Medical Record Number: [id number] Patient Account Number: [id number] Date of Birth/Sex: Treating RN: 02-24-1961 (62 y.o. [name] Primary Care Provider: Riki [name] Other Clinician: Referring Provider: Treating Provider/Extender: [name] in Treatment: 14 Debridement Performed for Assessment: Wound #19 [place] Performed By: Physician [name] Maudlin, MD Debridement Type: Debridement Severity of Tissue Pre Debridement: Fat layer exposed Level of Consciousness (Pre-procedure): Awake and Alert Pre-procedure Verification/Time Out Yes - 11:00 Taken: Start Time: 11:00 Pain Control: Lidocaine 5% topical ointment T Area Debrided (L x W): otal 10 (cm) x 2.8 (cm) = 28 (cm) Tissue and other material debrided: Non-Viable, Eschar, Slough, Biofilm, Slough Level: Non-Viable Tissue Debridement Description: Selective/Open Wound Instrument: Curette Bleeding:  Minimum Hemostasis Achieved: Pressure End Time: 11:03 Procedural Pain: 0 Post Procedural Pain: 0 Response to Treatment: Procedure was tolerated well Level of Consciousness (Post- Awake and Alert procedure): Post Debridement Measurements of Total Wound Length: (cm) 10 Width: (cm) 2.8 Depth: (cm) 0.3 Volume: (cm) 6.597 Floor, [name] ([id number] Page 2 of 13 Character of Wound/Ulcer Post Debridement: Improved Severity of Tissue Post Debridement: Fat layer exposed Post Procedure Diagnosis Same as Pre-procedure Notes Scribed for Dr. Celine [name] by [name] Electronic Signature(s) Signed: 10/26/2022 12:30:57 PM By: [name] Maudlin MD FACS Signed: 10/26/2022 1:04:45 PM By: [name] Catholic RN Entered By: [name] on 10/26/2022 11:10:35 -------------------------------------------------------------------------------- HPI Details Patient Name: Date of Service: [name], [name] 10/26/2022 10:15 A [name] Medical Record Number: [id number] Patient Account Number: [id number] Date of Birth/Sex: Treating RN: 09-16-1960 (62 y.o. F) Primary Care Provider: Riki [name] Other Clinician: Referring Provider: Treating Provider/Extender: [name] in Treatment: 14 History of Present Illness HPI Description: 07/23/2019 on evaluation today patient presents with a myriad of wounds noted at multiple locations over her right heel, right lower extremity, and abdominal region. She also has bilateral lower extremity lymphedema which is quite significant as well. Incidentally she also has congestive heart failure and hypertension. She also is obese. With that being said I think all this is contributing as well to her lower extremity edema which is very much uncontrolled. It seems like its been at least several months since she is worn any compression according to what she tells me. With that being said I am not sure exactly when that  would have been. She does have fibrotic changes in the lower extremity secondary to lymphedema worse on the left than the right. She did show me pictures of wound she had on the anterior portion of her shin which was quite significant fortunately that has healed. These issues have been intermittent at this time. Again I think that with appropriate compression therapy she may actually be doing better than what we are seeing at this point but again I am not  really sure that she is ever been extremely compliant with that. Fortunately there is no signs of active infection at this time. No fever chills noted. As far as the abdominal ulcer she initially had one wound that she thinks may have been a bug bite. Subsequently she was put a dressing on this and she states that the tape pulled skin off on other locations causing other wounds that have not healed that is been about 3 months. The wounds on her lower extremities have been intermittent over 3 years. This includes the heel. 11/19; this is a patient that I have not seen previously. She was admitted to our clinic last week with multiple wounds including several superficial circular areas on her abdomen predominantly right upper quadrant. Also 1 in her umbilicus. She has an area on her right lateral malleolus which was new today. She has bilateral lower extremity edema with very significant stasis dermatitis on the left anterior tibial area. She has tightly adherent skin in her lower extremities probably secondary to cutaneous fibrosis in the area. We put her in compression last week. She comes in with the dressings reasonably saturated. 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 She has multiple areas on her arms from scratching. 12/3; the patient I saw for the  first time 2 weeks ago. She has 3 small circular areas on her abdomen 2 in the right upper quadrant one on her umbilicus. We have been using silver alginate to this area. She also had an area on her right lateral malleolus and right heel and right medial malleolus. We have been using silver alginate here under compression. She does not have an open area on the left leg today. We are going to order her stockings for the left leg. She clearly has chronic lymphedema in these areas. X-ray of the foot from 10/20 did not show any fracture or dislocation. The heel wound was noted there was no evidence of osteomyelitis She complains of generalized pruritus. 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 She has longstanding lymphedema. Although she does not have known liver disease I looked at his CT scan of the abdomen from February of this year that showed hepatic steatosis 12/11; patient still has no open area on the left leg we transitioned her into a stocking she had. Her stockings from [employer] are supposed to be arriving later today which will be the stockings of choice. The only wound remaining on the right is the right heel 2 small superficial open areas. Everything else is well on its way to healing in the right leg few small excoriations. She has 1 major area remaining on her abdomen the rest seem to be healing 12/22 patient still has no open area on the left leg and she is still in a stocking. She had still 2 open areas on the tip of her right heel. These look like pressure related areas although the patient is not really certain how this is happening. She has an open heeled shoe that we provided. Still has 1 open area on the right lateral abdomen The patient has complaints of generalized pruritus. She has multiple excoriated areas on her abdomen that of close over her arms or thighs which I think are from scratching. She tells me that she has never had a basic  work-up  for this which might include basic lab work, liver functions, kidney functions, thyroid etc. She might also benefit from a dermatologist 12/29; the patient has a deeper larger more painful wound on the tip of her right heel. Again these look like pressure ulcers although she wears an open toed heel pads or heel at night to prevent it from hitting the mattress etc. They tell me and remind me that this is been present on and off for about 2 years that has closed over but then will reopen. I did look over her lab work that was available in [place]. She does not have elevated liver function tests or creatinine. I was not able to see a TSH. This was in response to her complaints of generalized itching and a itch/scratch cycle. I also noted that she is on OxyContin and oxycodone and of course narcotics can cause generalized pruritus as a side effect Readmission: History From [place], [place] Last Week: 03/29/2021 this is a patient who presents for initial evaluation here in the clinic though I have seen her 1 time previously in 2020 this was in [place]. That was actually her initial visit therefore a heel ulceration. Subsequently that did not healing she actually saw me the 1 time in November and then subsequently Hartel, Aerianna [name] ([id number]) [phone].pdf Page 3 of 13 saw Dr. Dellia Nims through the end of December where she apparently was healed. 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 This is on the right. With that being said right now she has a basically region on the lateral portion of the ray site where she is applying more pressure especially as her foot seems to be starting to turn in and this has become an increasingly significant issue for her to be honest. She does see Dr. Sharol Given and Dr. Sharol Given has had her on doxycycline for quite a bit of time here.  Subsequently she is just now wrapping that out. I think that she probably would be benefited by continue the doxycycline for a time while we can work through what we need to do with things here. She does have evidence of osteomyelitis based on what I saw on the MRI today. This obviously is unfortunate and definitely not something that she was hoping to hear. With that being said I do believe that she would potentially be a strong candidate for hyperbaric oxygen therapy. Also believe she could be a candidate for total contact cast though we have to be cautious due to the fact that how her foot is bending inward I think that this is good to be a little bit of a concern. We will definitely have to keeping a close eye on things. She does have a history of diabetes mellitus type 2. She also other than the amputation has a medical history positive for hypertension. She has never undergone hyperbaric oxygen therapy previously I did show her the chamber we did talk a little bit about it today as well. Admission [facility]: o 04/06/2021 Patient presents here in [place] today for evaluation. Again she is establishing care here after I saw her last week in [place]. Obviously I think that she is doing extremely well at this point which is great news and in general I am extremely pleased with where things stand from the standpoint of the appearance of the wound. Obviously this is not significantly smaller  but does look a lot cleaner I think using the Hydrofera Blue has been of benefit over the past week. Nonetheless she still has a wound with issues with pressure here which I think is good to be an ongoing issue. Also think that the osteomyelitis which is chronic is also getting ongoing issue. She does want to try to do what she can to prevent this from worsening and ending with a below-knee amputation. T that end I do think that getting her into the hyperbaric oxygen chamber would be what we need to  do. She has recently been seen by cardiology o and subsequently well as far as that is concerned. Her ejection fraction was appropriate for hyperbarics and everything seems to be doing great in that regard. She has not had a recent chest x-ray she is a former smoker around 15 years ago she quit. Nonetheless I do believe with her asthma we do want to do a chest x-ray just to make sure everything is okay before proceeding with the hyperbarics although we can go ahead and see about getting the approval. I also think she is going require some sharp debridement and around this wound we also have to contact her insurance for prior approval on this as well. 04/13/2021 upon evaluation today patient appears to be doing a little bit better in regard to her leg in fact the swelling is dramatically better. Very pleased with where things stand in that regard. Fortunately there does not appear to be any signs of active infection at this time. No fevers, chills, nausea, vomiting, or diarrhea. 04/20/2021 upon evaluation today patient appears to be doing decently well in regard to her foot. There is some need for sharp debridement here today. With that being said we will get a go ahead and proceed with that today as the foot is becoming somewhat macerated with the overhanging callus that is definitely not we want to see. With that being said the patient does have an issue here as well with a boil in the perineal region unfortunately that is an issue for her today as well. I did have a look at that as well. We are still working on dealing with insurance as far as getting hyperbarics approved. 04/27/2021 upon evaluation today patient appears to be doing somewhat poorly in general compared to where she has been. At this point she is having a lot of swelling which is the main concerning thing that I am seeing. There does not appear to be any signs of infection currently which is good news but at the same time I do feel like  that she is a lot more swollen than she was even last week and is showing how much she is weeping in regard to the right lower leg. She also has a blister on the left breast although is not an open wound at this time. She continues to have the area in the perineum as well. 05/04/2021 upon evaluation today patient appears to actually be doing decently well in regard to her wound on the foot. Fortunately there is no signs of active infection at this time. No fevers, chills, nausea, vomiting, or diarrhea. Unfortunately she does have an area on the left breast which is actually appearing to be a burn based on what I see physically. She does note that she uses rice bags for areas in general and may have left one in that area not realizing it was burning her as she does not really have much  feeling. I think this is very probable based on what I am seeing. For that reason working to treat this as such I do think we need to loosen up a lot of the necrotic tissue here. I Am going tosend in a prescription for Santyl for the patient. 9/1; patient presents for HBO today but cannot do treatment due to wheezing and feeling short of breath when laying down. She was set up as a doctor visit today since she was not able to do HBO. She states she feels okay overall and started having a dry cough over the past couple days. She has not tested herself for COVID. She denies fever/chills or sputum production. She thinks her symptoms are related to allergies. She has been using silver alginate to her right plantar foot wound. She has not been using Santyl to the breast wound because she reports forgetting to do this. She uses zinc oxide to her perennial wound. She currently denies systemic signs of infection. 9/8; patient presents for follow-up. She has been a unable to do HBO treatments for the past week due to her back pain. She is currently taking Flexeril to address this issue. She reports no issues with her compression  wrap. She has been putting Santyl on the left breast wound and Monistat cream to the perennial wound. She denies signs of infection. 9/15; patient presents for follow-up. Again she is unable to do HBO treatment today due to sinus pain. She has 2 new wounds to her right foot which she is unaware of. She has been putting Santyl on the left breast wound and zinc oxide to the perineal wound. She currently denies signs of infection. Readmission 10/17/2021 Ms. Georgann Escarsega is a 62 year old female with a past medical history of type 2 diabetes, osteomyelitis status post right BKA and morbid obesity that presents to the clinic for multiple scattered open wounds to her body. These are located to the abdomen, left posterior leg and sacral region. She has been using mupirocin ointment, Neosporin and zinc oxide to the wound beds. She has been started on Bactrim and Keflex by her primary care physician and states she has several days left to complete the course. She currently denies systemic signs of infection. READMISSION This is a 62 year old morbidly obese type II diabetic (poorly controlled with last hemoglobin A1c 9.2%). She has congestive heart failure, peripheral vascular disease, hypertension, coronary artery disease, venous stasis, connective tissue disease (o [name]'s syndrome), and bilateral lower extremity edema. She has been seen here for various reasons in the past. She has a right above-knee amputation. She was recently hospitalized for a left diabetic foot ulcer. Orthopedic surgery was consulted. An MRI was performed that was not conclusive for osteomyelitis, but given the extent of the necrosis, orthopedics recommended amputation. Infectious disease was also consulted and have recommended a 6-week course of oral doxycycline and Augmentin. Apparently wound care was also consulted while she was in the hospital and simply recommended painting the area with Betadine. She saw infectious disease on  October 27 with plans to continue doxycycline on Augmentin to continue therapy until November 14. The infectious disease provider also recommended that the patient reconsider amputation. Her PCP referred her to the wound care center, as the patient is very reluctant to undergo amputation. She also has a small ulcer on her anterior tibial surface that recently opened after a minor trauma. On exam, her left foot is rolled such that the lateral aspect of her foot is the contact surface. There  is a large ulcer with exposed necrotic muscle and fat. The wound does not probe to bone, but apparently there was bone exposure in the past while she was in the hospital. No malodor or purulent drainage. The wound on her anterior tibial surface is small with just a little slough accumulation. She has modest edema and skin changes consistent with stasis dermatitis. 07/28/2022: The anterior tibial wound is healed. The left plantar foot wound looks a little bit better today. There is significantly less necrotic tissue present. There is an area at the most caudal aspect of the wound that looks like there is ongoing pressure-induced tissue injury. Bone remains covered. 08/11/2022: The wound has deteriorated quite a bit since her last visit. Her husband reports that she has had significantly more drainage, such that he has had to change the dressing multiple times per day. During the course of the visit, she recalled that she had not been taking her Lasix for at least 4 days. There is substantial periwound maceration and further tissue breakdown. There is necrotic tissue at the midportion of the wound and tendon is now exposed underneath this necrotic muscle. 08/17/2022: The wound looks better this week. There is still an area of necrotic muscle and tendon in the midfoot, but the forefoot has improved with some epithelium beginning to come in from the margins. Unfortunately, there is evidence of ongoing pressure-induced  tissue injury near the heel. The patient does admit to resting her heel on a stool frequently. She is still taking Bactrim as prescribed and her Keystone topical antibiotic compound should arrive at their home today. [name], [name] ([id number]) [id number] Page 4 of 13 12/14; fairly large sized wound on the left plantar foot. She has a right AKA. She uses a leg only for transfers she says she does not propel her wheelchair with that foot. She has been using Keystone, silver alginate and an Ace wrap. She has completed her Bactrim and Augmentin. She is a type II diabetic. The most worrisome part of this wound is a smaller area roughly 2 x 2 in the distal part of the wound which is 100% occupied by necrotic tendon 09/07/2022: She had an extensive debridement at her last visit. The wound is smaller today and all of the tissues appear healthier. 09/15/2022: The wound measurements are about the same, but overall the surface appears healthier. There is still a little bit of the nonviable tendon exposed at the midfoot. 10/13/2022: The patient has been ill and unable to attend clinic for almost a month. Today, the wound measures smaller and there are buds of epithelium beginning to emerge, particularly at the calcaneus. There is a bridge of skin trying to come across the midportion of the wound, as well. Minimal slough accumulation. 10/26/2022: The absolute wound measurements have not changed, but the surface is technically smaller due to a bridge of epithelium that nearly divides the wound in two. There is some eschar at the medial edge of the calcaneal aspect of the wound, underneath which there is good epithelium. There is some slough on the wound surface, predominantly at the heel. Electronic Signature(s) Signed: 10/26/2022 11:15:24 AM By: [name] Maudlin MD FACS Entered By: [name] on 10/26/2022  11:15:24 -------------------------------------------------------------------------------- Physical Exam Details Patient Name: Date of Service: [name], [name] 10/26/2022 10:15 A [name] Medical Record Number: [id number] Patient Account Number: [id number] Date of Birth/Sex: Treating RN: 11-Feb-1961 (62 y.o. F) Primary Care Provider: Riki [name] Other Clinician: Referring Provider: Treating Provider/Extender: Derrel Nip,  Nicholas Weeks in Treatment: 14 Constitutional . . . . no acute distress. Respiratory Normal work of breathing on room air. Notes 10/26/2022: The absolute wound measurements have not changed, but the surface is technically smaller due to a bridge of epithelium that nearly divides the wound in two. There is some eschar at the medial edge of the calcaneal aspect of the wound, underneath which there is good epithelium. There is some slough on the wound surface, predominantly at the heel. Electronic Signature(s) Signed: 10/26/2022 11:15:50 AM By: [name] Maudlin MD FACS Entered By: [name] on 10/26/2022 11:15:50 -------------------------------------------------------------------------------- Physician Orders Details Patient Name: Date of Service: [name], [name] 10/26/2022 10:15 A [name] Medical Record Number: [id number] Patient Account Number: [id number] Date of Birth/Sex: Treating RN: Jan 02, 1961 (62 y.o. 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 - Dr. Celine [name] - room 2 Return A Anesthetic (In clinic) Topical Lidocaine 5% applied to wound bed Bathing/ Shower/ Hygiene May shower and wash wound with soap and water. - with dressing changes Edema Control - Lymphedema / SCD / Other Elevate legs to the level of the heart or above for 30 minutes daily and/or when sitting for 3-4 times a day throughout the day. Avoid standing for long periods of time. Off-Loading Other: - Try and elevate Left leg throughout the day Wound Treatment Wound #19 - Foot Wound Laterality: Plantar, Left Cleanser: Soap and Water 1 x Per Day/30 Days Discharge Instructions: May shower and wash wound with dial antibacterial soap and water prior to dressing change. Cleanser: Wound Cleanser (Generic) 1 x Per Day/30 Days Discharge Instructions: Cleanse the wound with wound cleanser prior to applying a clean dressing using gauze sponges, not tissue or cotton balls. Prim Dressing: Sorbalgon AG Dressing 6x6 (in/in) (Generic) 1 x Per Day/30 Days ary Discharge Instructions: Apply to wound bed as instructed Secondary Dressing: Woven Gauze Sponge, Non-Sterile 4x4 in 1 x Per Day/30 Days Discharge Instructions: Apply over primary dressing as directed. Secondary Dressing: Zetuvit Plus 4x8 in 1 x Per Day/30 Days Discharge Instructions: Apply over primary dressing as directed. Secured With: Elastic Bandage 4 inch (ACE bandage) (Generic) 1 x Per Day/30 Days Discharge Instructions: Secure with ACE bandage as directed. Secured With: [employer], 4.5x3.1 (in/yd) (Generic) 1 x Per Day/30 Days Discharge Instructions: Secure with Kerlix as directed. Secured With: 13M Medipore Public affairs consultant Surgical T 2x10 (in/yd) (Generic) 1 x Per Day/30  Days ape Discharge Instructions: Secure with tape as directed. Electronic Signature(s) Signed: 10/26/2022 12:30:57 PM By: [name] Maudlin MD FACS Entered By: [name] on 10/26/2022 11:16:03 -------------------------------------------------------------------------------- Problem List Details Patient Name: Date of Service: [place], [name] 10/26/2022 10:15 A [name] Medical Record Number: [id number] Patient Account Number: [id number] Date of Birth/Sex: Treating RN: Sep 22, 1960 (62 y.o. 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, unspecified 07/19/2022 No Yes I73.9 Peripheral vascular disease, unspecified 07/19/2022 No Yes [id number] Chronic diastolic (congestive) heart failure 07/19/2022 No Yes 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 10/26/2022 10:15 A [name] Medical Record Number: [id number] Patient  Account Number: [id number] Date of Birth/Sex: Treating RN: 1960/12/01 (62 y.o. F) Primary Care Provider: Riki [name] Other Clinician: Referring Provider: Treating Provider/Extender: [name] in Treatment: 14 Subjective Chief Complaint Information obtained from Patient 10/17/2021; multiple scattered wounds to the abdomen, posterior left leg, left labia and sacrum 07/19/2022: left foot DFU History of Present Illness (HPI) Corliss, Luiza [name] ([id number]) [id number] Page 7 of 13 07/23/2019 on evaluation today patient presents with a myriad of wounds noted at multiple locations over her right heel, right lower extremity, and abdominal region. She also has bilateral lower extremity lymphedema which is quite significant as well. Incidentally she also has congestive heart failure and hypertension. She also is obese. With that being said I think all this is contributing as well to her lower extremity edema which is very much uncontrolled. It seems like its been at least several months since she is worn any compression according to what she tells me. With that being said I am not sure exactly when that would have been. She does have fibrotic changes in the lower extremity secondary to lymphedema worse on the left than the right. She did show me pictures of wound she had on the anterior portion of her shin which was quite significant fortunately that has healed. These issues have been intermittent at this time. Again I think that with appropriate compression therapy she may actually be doing better than what we are seeing at this point but again I am not really sure that she is ever been extremely compliant with that. Fortunately there is no signs of active infection at this time. No fever chills noted. As far as the abdominal ulcer she initially had one wound that she thinks may have been a bug bite. Subsequently she was put a dressing on this  and she states that the tape pulled skin off on other locations causing other wounds that have not healed that is been about 3 months. The wounds on her lower extremities have been intermittent over 3 years. This includes the heel. 11/19; this is a patient that I have not seen previously. She was admitted to our clinic last week with multiple wounds including several superficial circular areas on her abdomen predominantly right upper quadrant. Also 1 in her umbilicus. She has an area on her right lateral malleolus which was new today. She has bilateral lower extremity edema with very significant stasis dermatitis on the left anterior tibial area. She has tightly adherent skin in her lower extremities probably secondary to cutaneous fibrosis in the area. We put her in compression last  week. She comes in with the dressings reasonably saturated. 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 She has multiple areas on her arms from scratching. 12/3; the patient I saw for the first time 2 weeks ago. She has 3 small circular areas on her abdomen 2 in the right upper quadrant one on her umbilicus. We have been using silver alginate to this area. She also had an area on her right lateral malleolus and right heel and right medial malleolus. We have been using silver alginate here under compression. She does not have an open area on the left leg today. We are going to order her stockings for the left leg. She clearly has chronic lymphedema in these areas. X-ray of the foot from 10/20 did not show any fracture or dislocation. The heel wound was noted there was no evidence of osteomyelitis She complains of generalized pruritus. 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 She has  longstanding lymphedema. Although she does not have known liver disease I looked at his CT scan of the abdomen from February of this year that showed hepatic steatosis 12/11; patient still has no open area on the left leg we transitioned her into a stocking she had. Her stockings from [facility] are supposed to be arriving later today which will be the stockings of choice. The only wound remaining on the right is the right heel 2 small superficial open areas. Everything else is well on its way to healing in the right leg few small excoriations. She has 1 major area remaining on her abdomen the rest seem to be healing 12/22 patient still has no open area on the left leg and she is still in a stocking. She had still 2 open areas on the tip of her right heel. These look like pressure related areas although the patient is not really certain how this is happening. She has an open heeled shoe that we provided. Still has 1 open area on the right lateral abdomen The patient has complaints of generalized pruritus. She has multiple excoriated areas on her abdomen that of close over her arms or thighs which I think are from scratching. She tells me that she has never had a basic work-up for this which might include basic lab work, liver functions, kidney functions, thyroid etc. She might also benefit from a dermatologist 12/29; the patient has a deeper larger more painful wound on the tip of her right heel. Again these look like pressure ulcers although she wears an open toed heel pads or heel at night to prevent it from hitting the mattress etc. They tell me and remind me that this is been present on and off for about 2 years that has closed over but then will reopen. I did look over her lab work that was available in [place]. She does not have elevated liver function tests or creatinine. I was not able to see a TSH. This was in response to her complaints of generalized itching and a itch/scratch cycle.  I also noted that she is on OxyContin and oxycodone and of course narcotics can cause generalized pruritus as a side effect Readmission: History From [place], [place] Last Week: 03/29/2021 this is a patient who presents for initial evaluation here in the clinic though I have seen her 1 time previously  in 2020 this was in [place]. That was actually her initial visit therefore a heel ulceration. Subsequently that did not healing she actually saw me the 1 time in November and then subsequently saw Dr. Dellia Nims through the end of December where she apparently was healed. 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 This is on the right. With that being said right now she has a basically region on the lateral portion of the ray site where she is applying more pressure especially as her foot seems to be starting to turn in and this has become an increasingly significant issue for her to be honest. She does see Dr. Sharol Given and Dr. Sharol Given has had her on doxycycline for quite a bit of time here. Subsequently she is just now wrapping that out. I think that she probably would be benefited by continue the doxycycline for a time while we can work through what we need to do with things here. She does have evidence of osteomyelitis based on what I saw on the MRI today. This obviously is unfortunate and definitely not something that she was hoping to hear. With that being said I do believe that she would potentially be a strong candidate for hyperbaric oxygen therapy. Also believe she could be a candidate for total contact cast though we have to be cautious due to the fact that how her foot is bending inward I think that this is good to be a little bit of a concern. We will definitely have to keeping a close eye on things. She does have a history of diabetes mellitus type 2. She also other than the amputation has a  medical history positive for hypertension. She has never undergone hyperbaric oxygen therapy previously I did show her the chamber we did talk a little bit about it today as well. Admission [facility]: o 04/06/2021 Patient presents here in [place] today for evaluation. Again she is establishing care here after I saw her last week in [place]. Obviously I think that she is doing extremely well at this point which is great news and in general I am extremely pleased with where things stand from the standpoint of the appearance of the wound. Obviously this is not significantly smaller but does look a lot cleaner I think using the Hydrofera Blue has been of benefit over the past week. Nonetheless she still has a wound with issues with pressure here which I think is good to be an ongoing issue. Also think that the osteomyelitis which is chronic is also getting ongoing issue. She does want to try to do what she can to prevent this from worsening and ending with a below-knee amputation. T that end I do think that getting her into the hyperbaric oxygen chamber would be what we need to do. She has recently been seen by cardiology o and subsequently well as far as that is concerned. Her ejection fraction was appropriate for hyperbarics and everything seems to be doing great in that regard. She has not had a recent chest x-ray she is a former smoker around 15 years ago she quit. Nonetheless I do believe with her asthma we do want to do a chest x-ray just to make sure everything is okay before proceeding with the hyperbarics although we can go ahead and see about getting the approval. I also think she is going require some sharp  debridement and around this wound we also have to contact her insurance for prior approval on this as well. 04/13/2021 upon evaluation today patient appears to be doing a little bit better in regard to her leg in fact the swelling is dramatically better. Very pleased with where  things stand in that regard. Fortunately there does not appear to be any signs of active infection at this time. No fevers, chills, nausea, vomiting, or diarrhea. 04/20/2021 upon evaluation today patient appears to be doing decently well in regard to her foot. There is some need for sharp debridement here today. With that being said we will get a go ahead and proceed with that today as the foot is becoming somewhat macerated with the overhanging callus that is definitely not we want to see. With that being said the patient does have an issue here as well with a boil in the perineal region unfortunately that is an issue for her today as well. I did have a look at that as well. We are still working on dealing with insurance as far as getting hyperbarics approved. 04/27/2021 upon evaluation today patient appears to be doing somewhat poorly in general compared to where she has been. At this point she is having a lot of swelling which is the main concerning thing that I am seeing. There does not appear to be any signs of infection currently which is good news but at the same time I do feel like that she is a lot more swollen than she was even last week and is showing how much she is weeping in regard to the right lower leg. She also has a blister on the left breast although is not an open wound at this time. She continues to have the area in the perineum as well. 05/04/2021 upon evaluation today patient appears to actually be doing decently well in regard to her wound on the foot. Fortunately there is no signs of active infection at this time. No fevers, chills, nausea, vomiting, or diarrhea. Unfortunately she does have an area on the left breast which is actually appearing to be a burn based on what I see physically. She does note that she uses rice bags for areas in general and may have left one in that area not realizing it was burning her as she does not really have much feeling. I think this is very  probable based on what I am seeing. For that reason working to treat this as such I do think we need to loosen up a lot of the necrotic tissue here. I Am going tosend in a prescription for Santyl for the patient. [name], [name] ([id number]) [id number] Page 8 of 13 9/1; patient presents for HBO today but cannot do treatment due to wheezing and feeling short of breath when laying down. She was set up as a doctor visit today since she was not able to do HBO. She states she feels okay overall and started having a dry cough over the past couple days. She has not tested herself for COVID. She denies fever/chills or sputum production. She thinks her symptoms are related to allergies. She has been using silver alginate to her right plantar foot wound. She has not been using Santyl to the breast wound because she reports forgetting to do this. She uses zinc oxide to her perennial wound. She currently denies systemic signs of infection. 9/8; patient presents for follow-up. She has been a unable to do HBO treatments for the past week  due to her back pain. She is currently taking Flexeril to address this issue. She reports no issues with her compression wrap. She has been putting Santyl on the left breast wound and Monistat cream to the perennial wound. She denies signs of infection. 9/15; patient presents for follow-up. Again she is unable to do HBO treatment today due to sinus pain. She has 2 new wounds to her right foot which she is unaware of. She has been putting Santyl on the left breast wound and zinc oxide to the perineal wound. She currently denies signs of infection. Readmission 10/17/2021 Ms. Rochell Ewbank is a 62 year old female with a past medical history of type 2 diabetes, osteomyelitis status post right BKA and morbid obesity that presents to the clinic for multiple scattered open wounds to her body. These are located to the abdomen, left posterior leg and sacral region.  She has been using mupirocin ointment, Neosporin and zinc oxide to the wound beds. She has been started on Bactrim and Keflex by her primary care physician and states she has several days left to complete the course. She currently denies systemic signs of infection. READMISSION This is a 62 year old morbidly obese type II diabetic (poorly controlled with last hemoglobin A1c 9.2%). She has congestive heart failure, peripheral vascular disease, hypertension, coronary artery disease, venous stasis, connective tissue disease (o [name]'s syndrome), and bilateral lower extremity edema. She has been seen here for various reasons in the past. She has a right above-knee amputation. She was recently hospitalized for a left diabetic foot ulcer. Orthopedic surgery was consulted. An MRI was performed that was not conclusive for osteomyelitis, but given the extent of the necrosis, orthopedics recommended amputation. Infectious disease was also consulted and have recommended a 6-week course of oral doxycycline and Augmentin. Apparently wound care was also consulted while she was in the hospital and simply recommended painting the area with Betadine. She saw infectious disease on October 27 with plans to continue doxycycline on Augmentin to continue therapy until November 14. The infectious disease provider also recommended that the patient reconsider amputation. Her PCP referred her to the wound care center, as the patient is very reluctant to undergo amputation. She also has a small ulcer on her anterior tibial surface that recently opened after a minor trauma. On exam, her left foot is rolled such that the lateral aspect of her foot is the contact surface. There is a large ulcer with exposed necrotic muscle and fat. The wound does not probe to bone, but apparently there was bone exposure in the past while she was in the hospital. No malodor or purulent drainage. The wound on her anterior tibial surface is small  with just a little slough accumulation. She has modest edema and skin changes consistent with stasis dermatitis. 07/28/2022: The anterior tibial wound is healed. The left plantar foot wound looks a little bit better today. There is significantly less necrotic tissue present. There is an area at the most caudal aspect of the wound that looks like there is ongoing pressure-induced tissue injury. Bone remains covered. 08/11/2022: The wound has deteriorated quite a bit since her last visit. Her husband reports that she has had significantly more drainage, such that he has had to change the dressing multiple times per day. During the course of the visit, she recalled that she had not been taking her Lasix for at least 4 days. There is substantial periwound maceration and further tissue breakdown. There is necrotic tissue at the midportion of the wound  and tendon is now exposed underneath this necrotic muscle. 08/17/2022: The wound looks better this week. There is still an area of necrotic muscle and tendon in the midfoot, but the forefoot has improved with some epithelium beginning to come in from the margins. Unfortunately, there is evidence of ongoing pressure-induced tissue injury near the heel. The patient does admit to resting her heel on a stool frequently. She is still taking Bactrim as prescribed and her Keystone topical antibiotic compound should arrive at their home today. 12/14; fairly large sized wound on the left plantar foot. She has a right AKA. She uses a leg only for transfers she says she does not propel her wheelchair with that foot. She has been using Keystone, silver alginate and an Ace wrap. She has completed her Bactrim and Augmentin. She is a type II diabetic. The most worrisome part of this wound is a smaller area roughly 2 x 2 in the distal part of the wound which is 100% occupied by necrotic tendon 09/07/2022: She had an extensive debridement at her last visit. The wound is smaller  today and all of the tissues appear healthier. 09/15/2022: The wound measurements are about the same, but overall the surface appears healthier. There is still a little bit of the nonviable tendon exposed at the midfoot. 10/13/2022: The patient has been ill and unable to attend clinic for almost a month. Today, the wound measures smaller and there are buds of epithelium beginning to emerge, particularly at the calcaneus. There is a bridge of skin trying to come across the midportion of the wound, as well. Minimal slough accumulation. 10/26/2022: The absolute wound measurements have not changed, but the surface is technically smaller due to a bridge of epithelium that nearly divides the wound in two. There is some eschar at the medial edge of the calcaneal aspect of the wound, underneath which there is good epithelium. There is some slough on the wound surface, predominantly at the heel. Patient History Information obtained from Patient. Family History Diabetes - Mother, Heart Disease - Mother,Father,Siblings, Hypertension - Mother,Father,Siblings, Lung Disease - Father, Stroke - Mother, No family history of Cancer, Hereditary Spherocytosis, Kidney Disease, Seizures, Thyroid Problems, Tuberculosis. Social History Former smoker - quit 15 years ago, Marital Status - Married, Alcohol Use - Never, Drug Use - No History, Caffeine Use - Rarely. Medical History Eyes Patient has history of Cataracts - both eyes Denies history of Glaucoma, Optic Neuritis Ear/Nose/Mouth/Throat Denies history of Chronic sinus problems/congestion, Middle ear problems Hematologic/Lymphatic Patient has history of Lymphedema Denies history of Anemia, Hemophilia, Human Immunodeficiency Virus, Sickle Cell Disease Respiratory Patient has history of Asthma Denies history of Aspiration, Chronic Obstructive Pulmonary Disease (COPD), Pneumothorax, Sleep Apnea, Tuberculosis Cardiovascular Patient has history of Congestive Heart  Failure, Coronary Artery Disease, Hypertension, Peripheral Arterial Disease, Peripheral Venous Disease Denies history of Angina, Arrhythmia, Hypotension, Myocardial Infarction, Phlebitis, Vasculitis Gastrointestinal Aramburo, Temeca [name] ([id number] Page 9 of 13 Denies history of Hepatitis A, Hepatitis B, Hepatitis C Endocrine Patient has history of Type II Diabetes Denies history of Type I Diabetes Genitourinary Denies history of End Stage Renal Disease Immunological Denies history of Lupus Erythematosus, Raynaudoos, Scleroderma Integumentary (Skin) Denies history of History of Burn Musculoskeletal Patient has history of Rheumatoid Arthritis Denies history of Gout, Osteoarthritis, Osteomyelitis Neurologic Patient has history of Neuropathy Denies history of Dementia, Quadriplegia, Paraplegia, Seizure Disorder Oncologic Denies history of Received Chemotherapy, Received Radiation Psychiatric Denies history of Anorexia/bulimia, Confinement Anxiety Hospitalization/Surgery History - 4 and 5th toe right  foot amputations Dr. Sharol Given 01/2020. 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 - 11/22 R AKA. 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 Vitals Time Taken: 9:46 AM, Height: 62 in, Weight: 284 lbs, BMI: 51.9, Temperature: 98.1 F, Pulse: 86 bpm, Respiratory Rate: 18 breaths/min, Blood Pressure: 134/69 mmHg. Respiratory Normal work of breathing on room air. General Notes: 10/26/2022: The absolute wound measurements have not changed, but the surface is technically smaller due to a bridge of epithelium that nearly divides the wound in two. There is some eschar at  the medial edge of the calcaneal aspect of the wound, underneath which there is good epithelium. There is some slough on the wound surface, predominantly at the heel. Integumentary (Hair, Skin) Wound #19 status is Open. Original cause of wound was Gradually Appeared. The date acquired was: 05/17/2022. The wound has been in treatment 14 weeks. The wound is located on the [place]. The wound measures 10cm length x 2.8cm width x 0.3cm depth; 21.991cm^2 area and 6.597cm^3 volume. There is Fat Layer (Subcutaneous Tissue) exposed. There is no tunneling or undermining noted. There is a medium amount of serosanguineous drainage noted. The wound margin is distinct with the outline attached to the wound base. There is large (67-100%) red, pink granulation within the wound bed. There is a small (1- 33%) amount of necrotic tissue within the wound bed including Eschar and Adherent Slough. The periwound skin appearance exhibited: Callus, Dry/Scaly, Erythema. The periwound skin appearance did not exhibit: Crepitus, Excoriation, Induration, Rash, Scarring, Maceration, Atrophie Blanche, Cyanosis, Ecchymosis, Hemosiderin Staining, Mottled, Pallor, Rubor. The surrounding wound skin color is noted with erythema. Periwound temperature was noted as No Abnormality. 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 There was a Selective/Open Wound Non-Viable Tissue Debridement with a total area of 28 sq cm performed by [name] Maudlin, MD. With the following instrument(s): Curette to remove Non-Viable tissue/material. Material removed includes Eschar, [place], and Biofilm after achieving pain control using Lidocaine 5% topical ointment. No specimens were taken. A time out was conducted at 11:00, prior to the start of the procedure. A Minimum amount of bleeding was controlled with Pressure. The procedure was tolerated well with a pain level of 0 throughout and a pain level of 0 following the procedure. Post Debridement Measurements: 10cm length x 2.8cm width x 0.3cm depth; 6.597cm^3 volume. Character of Wound/Ulcer Post Debridement is improved. Severity of Tissue Post Debridement is: Fat layer exposed. Post procedure Diagnosis Wound #19: Same as Pre-Procedure General Notes: Scribed for Dr. Celine [name] by [name] Plan Follow-up Appointments: Return Appointment in 2 weeks. - Dr. Celine [name] - room 2 Anesthetic: (In clinic) Topical Lidocaine 5% applied to wound bed Bathing/ Shower/ Hygiene: May shower and wash wound with soap and water. - with dressing changes Edema Control - Lymphedema / SCD / Other: Elevate legs to the level of the heart  or above for 30 minutes daily and/or when sitting for 3-4 times a day throughout the day. Avoid standing for long periods of time. Off-Loading: Other: - Try and elevate Left leg throughout the day WOUND #19: - Foot Wound Laterality: Plantar, Left Cleanser: Soap and Water 1 x Per Day/30 Days Discharge Instructions: May shower and wash wound with dial antibacterial soap and water prior to dressing change. Cleanser: Wound Cleanser (Generic) 1 x Per Day/30 Days Discharge Instructions: Cleanse the wound with wound cleanser prior to applying a clean dressing using gauze sponges, not tissue or cotton balls. Prim Dressing: Sorbalgon AG Dressing 6x6 (in/in) (Generic) 1 x Per  Day/30 Days ary Discharge Instructions: Apply to wound bed as instructed Secondary Dressing: Woven Gauze Sponge, Non-Sterile 4x4 in 1 x Per Day/30 Days Discharge Instructions: Apply over primary dressing as directed. Secondary Dressing: Zetuvit Plus 4x8 in 1 x Per Day/30 Days Discharge Instructions: Apply over primary dressing as directed. Secured With: Elastic Bandage 4 inch (ACE bandage) (Generic) 1 x Per Day/30 Days Discharge Instructions: Secure with ACE bandage as directed. Secured With: [employer], 4.5x3.1 (in/yd) (Generic) 1 x Per Day/30 Days Discharge Instructions: Secure with Kerlix as directed. Secured With: 57M Medipore Public affairs consultant Surgical T 2x10 (in/yd) (Generic) 1 x Per Day/30 Days ape Discharge Instructions: Secure with tape as directed. 10/26/2022: The absolute wound measurements have not changed, but the surface is technically smaller due to a bridge of epithelium that nearly divides the wound in two. There is some eschar at the medial edge of the calcaneal aspect of the wound, underneath which there is good epithelium. There is some slough on the wound surface, predominantly at the heel. I used a curette to debride slough, eschar, and biofilm from her wound. We will continue to use her Keystone topical antibiotic compound with silver alginate, Kerlix, and Ace bandage. Follow-up in 2 weeks. Electronic Signature(s) Signed: 10/26/2022 11:16:34 AM By: [name] Maudlin MD FACS Entered By: [name] on 10/26/2022 11:16:33 -------------------------------------------------------------------------------- HxROS Details Patient Name: Date of Service: [name], [name] 10/26/2022 10:15 A [name] Medical Record Number: [id number] Patient Account Number: [id number] Date of Birth/Sex: Treating RN: 11-Mar-1961 (62 y.o. F) Primary Care Provider: Riki [name] Other Clinician: Lumpkin, [name] ([id number]) [id number] Page 11 of 13 Referring  Provider: Treating Provider/Extender: [name] in Treatment: 14 Information Obtained From Patient Constitutional Symptoms (General Health) Medical History: Past Medical History Notes: hypothyroidism Eyes Medical History: Positive for: Cataracts - both eyes Negative for: Glaucoma; Optic Neuritis Ear/Nose/Mouth/Throat Medical History: Negative for: Chronic sinus problems/congestion; Middle ear problems Hematologic/Lymphatic Medical History: Positive for: Lymphedema Negative for: Anemia; Hemophilia; Human Immunodeficiency Virus; Sickle Cell Disease Respiratory Medical History: Positive for: Asthma Negative for: Aspiration; Chronic Obstructive Pulmonary Disease (COPD); Pneumothorax; Sleep Apnea; Tuberculosis Cardiovascular Medical History: Positive for: Congestive Heart Failure; Coronary Artery Disease; Hypertension; Peripheral Arterial Disease; Peripheral Venous Disease Negative for: Angina; Arrhythmia; Hypotension; Myocardial Infarction; Phlebitis; Vasculitis Past Medical History Notes: cardiac stents Gastrointestinal Medical History: Negative for: Hepatitis A; Hepatitis B; Hepatitis C Past Medical History Notes: GERD Endocrine Medical History: Positive for: Type II Diabetes Negative for: Type I Diabetes Past Medical History Notes: Hypothyroidism Time with diabetes: since 2002 Treated with: Insulin, Oral agents Blood sugar tested every day: Yes Tested : 2-3 times a day Genitourinary Medical History: Negative for: End Stage Renal Disease Immunological Medical History: Negative for: Lupus Erythematosus; Raynauds; Scleroderma 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 Integumentary (Skin) Medical History: Negative  for: History of Burn Donigan, Tyanna [name] ([id number]) [phone].pdf Page 12 of 13 Musculoskeletal Medical History: Positive for: Rheumatoid  Arthritis Negative for: Gout; Osteoarthritis; Osteomyelitis Past Medical History Notes: Fibromyalgia, Scoliosis, Neurologic Medical History: Positive for: Neuropathy Negative for: Dementia; Quadriplegia; Paraplegia; Seizure Disorder Oncologic Medical History: Negative for: Received Chemotherapy; Received Radiation Psychiatric Medical History: Negative for: Anorexia/bulimia; Confinement Anxiety HBO Extended History Items Eyes: Cataracts Immunizations Pneumococcal Vaccine: Received Pneumococcal Vaccination: Yes Received Pneumococcal Vaccination On or After 60th Birthday: Yes Implantable Devices None Hospitalization / Surgery History Type of Hospitalization/Surgery 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 Family and Social History Cancer: No; Diabetes: Yes - Mother; Heart Disease: Yes - Mother,Father,Siblings; Hereditary Spherocytosis: No; Hypertension: Yes - Mother,Father,Siblings; Kidney Disease: No; Lung Disease: Yes - Father; Seizures: No; Stroke: Yes - Mother; Thyroid Problems: No; Tuberculosis: No; Former smoker - quit 15 years ago; Marital Status - Married; Alcohol Use: Never; Drug Use: No History; Caffeine Use: Rarely; Financial Concerns: No; Food, Clothing or Shelter Needs: No; Support System Lacking: No; Transportation Concerns: No Electronic Signature(s) Signed: 10/26/2022 12:30:57 PM By: [name] Maudlin MD FACS Entered By: [name] on 10/26/2022 11:15:31 -------------------------------------------------------------------------------- SuperBill Details Patient Name: Date of Service: [place], [name] 10/26/2022 Medical Record Number: [id number] Patient Account Number: [id number] Date of Birth/Sex: Treating RN: 12/27/1960 (62 y.o. 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 M86.9 Osteomyelitis, unspecified E11.621 Type 2 diabetes mellitus with foot ulcer E66.01 Morbid (severe) obesity due to excess calories M35.00 [name] syndrome, unspecified I73.9 Peripheral vascular disease, unspecified [id number] Chronic diastolic (congestive) heart failure 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 Facility Procedures : 7 CPT4 Code: [id number] Description: [id number] - DEBRIDE WOUND 1ST 20 SQ CM OR < ICD-10 Diagnosis Description 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 Modifier: Quantity: 1 : 7 CPT4 Code: [id number] Description: [id number] - DEBRIDE WOUND EA ADDL 20 SQ CM ICD-10 Diagnosis Description 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 Modifier: Quantity: 1 Physician Procedures : CPT4 Code Description Modifier [id number] - WC PHYS LEVEL 4 - EST PT 25 ICD-10 Diagnosis Description 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 M86.9 Osteomyelitis, unspecified E11.621 Type 2 diabetes  mellitus with foot ulcer I73.9 Peripheral vascular disease, unspecified Quantity: 1 : [id number] - WC PHYS DEBR WO ANESTH 20 SQ CM ICD-10 Diagnosis Description 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

## 2022-10-27 NOTE — Progress Notes (Signed)
Eischen, Nataliyah M ([id number].pdf Page 1 of 8 Visit Report for 10/26/2022 Arrival Information Details Patient Name: Date of Service: Mcadory, [name] 10/26/2022 10:15 A M Medical Record Number: [id number] Patient Account Number: [id number] Date of Birth/Sex: Treating RN: 03-27-1961 (62 y.o. [name] Primary Care Arianie Couse: [name] Other Clinician: Referring Twylla Arceneaux: Treating Rivaldo Hineman/Extender: Louann Sjogren in Treatment: 14 Visit Information History Since Last Visit Added or deleted any medications: No Patient Arrived: Wheel Chair Any new allergies or adverse reactions: No Arrival Time: 10:46 Had a fall or experienced change in No Accompanied By: spouse activities of daily living that may affect Transfer Assistance: Other risk of falls: Patient Requires Transmission-Based Precautions: No Signs or symptoms of abuse/neglect since last visito No Patient Has Alerts: No Hospitalized since last visit: No Implantable device outside of the clinic excluding No cellular tissue based products placed in the center since last visit: Has Dressing in Place as Prescribed: Yes Pain Present Now: Yes Electronic Signature(s) Signed: 10/26/2022 1:04:45 PM By: Dellie Catholic RN Entered By: Dellie Catholic on 10/26/2022 10:54:32 -------------------------------------------------------------------------------- Encounter Discharge Information Details Patient Name: Date of Service: Hedlund, [name] 10/26/2022 10:15 A M Medical Record Number: [id number] Patient Account Number: [id number] Date of Birth/Sex: Treating RN: [birth year] (62 y.o. [name] Primary Care Enas Winchel: [name] Other Clinician: Referring Tezra Mahr: Treating Annalucia Laino/Extender: Louann Sjogren in Treatment: 14 Encounter Discharge Information Items Post Procedure Vitals Discharge Condition: Stable Temperature  (F): 98.1 Ambulatory Status: Wheelchair Pulse (bpm): 86 Discharge Destination: Home Respiratory Rate (breaths/min): 18 Transportation: Private Auto Blood Pressure (mmHg): 134/69 Accompanied By: spouse Schedule Follow-up Appointment: Yes Clinical Summary of Care: Patient Declined Electronic Signature(s) Signed: 10/26/2022 1:04:45 PM By: Dellie Catholic RN Entered By: Dellie Catholic on 10/26/2022 13:00:58 Manske, [name] ([id number].pdf Page 2 of 8 -------------------------------------------------------------------------------- Lower Extremity Assessment Details Patient Name: Date of Service: Biondolillo, [name] 10/26/2022 10:15 A M Medical Record Number: [id number] Patient Account Number: [id number] Date of Birth/Sex: Treating RN: 1961/04/14 (62 y.o. [name] Primary Care Contrell Ballentine: [name] Other Clinician: Referring Christena Sunderlin: Treating Nikkol Pai/Extender: Louann Sjogren in Treatment: 14 Edema Assessment Assessed: [name]: No] [Right: No] Edema: [Left: Ye] [Right: s] Calf Left: Right: Point of Measurement: 30 cm From Medial Instep 45 cm Ankle Left: Right: Point of Measurement: 9 cm From Medial Instep 26 cm Vascular Assessment Pulses: Dorsalis Pedis Palpable: [Left:Yes] Electronic Signature(s) Signed: 10/26/2022 1:04:45 PM By: Dellie Catholic RN Entered By: Dellie Catholic on 10/26/2022 11:03:59 -------------------------------------------------------------------------------- Multi Wound Chart Details Patient Name: Date of Service: Korff, [name] 10/26/2022 10:15 A M Medical Record Number: [id number] Patient Account Number: [id number] Date of Birth/Sex: Treating RN: 16-May-1961 (62 y.o. F) Primary Care Jantzen Pilger: [name] Other Clinician: Referring Indie Boehne: Treating Hicks Feick/Extender: Louann Sjogren in Treatment: 14 Vital Signs Height(in): 40 Pulse(bpm):  46 Weight(lbs): 284 Blood Pressure(mmHg): 134/69 Body Mass Index(BMI): 51.9 Temperature(F): 98.1 Respiratory Rate(breaths/min): 18 [19:Photos:] [N/A:N/A] Left, Plantar Foot N/A N/A Wound Location: Gradually Appeared N/A N/A Wounding Event: Diabetic Wound/Ulcer of the Lower N/A N/A Primary Etiology: Extremity Cataracts, Lymphedema, Asthma, N/A N/A Comorbid History: Congestive Heart Failure, Coronary Artery Disease, Hypertension, Peripheral Arterial Disease, Peripheral Venous Disease, Type II Diabetes, Rheumatoid Arthritis, Neuropathy 05/17/2022 N/A N/A Date Acquired: 14 N/A N/A Weeks of Treatment: Open N/A N/A Wound Status: No N/A N/A Wound Recurrence: 10x2.8x0.3 N/A N/A Measurements L x W x D (cm) 21.991 N/A N/A A (cm) : rea 6.597 N/A  N/A Volume (cm) : 2.10% N/A N/A % Reduction in A rea: 26.60% N/A N/A % Reduction in Volume: Grade 2 N/A N/A Classification: Medium N/A N/A Exudate A mount: Serosanguineous N/A N/A Exudate Type: red, brown N/A N/A Exudate Color: Distinct, outline attached N/A N/A Wound Margin: Large (67-100%) N/A N/A Granulation A mount: Red, Pink N/A N/A Granulation Quality: Small (1-33%) N/A N/A Necrotic A mount: Eschar, Adherent Slough N/A N/A Necrotic Tissue: Fat Layer (Subcutaneous Tissue): Yes N/A N/A Exposed Structures: Fascia: No Tendon: No Muscle: No Joint: No Bone: No Small (1-33%) N/A N/A Epithelialization: Debridement - Selective/Open Wound N/A N/A Debridement: Pre-procedure Verification/Time Out 11:00 N/A N/A Taken: Lidocaine 5% topical ointment N/A N/A Pain Control: Necrotic/Eschar, Slough N/A N/A Tissue Debrided: Non-Viable Tissue N/A N/A Level: 28 N/A N/A Debridement A (sq cm): rea Curette N/A N/A Instrument: Minimum N/A N/A Bleeding: Pressure N/A N/A Hemostasis A chieved: 0 N/A N/A Procedural Pain: 0 N/A N/A Post Procedural Pain: Procedure was tolerated well N/A N/A Debridement Treatment  Response: 10x2.8x0.3 N/A N/A Post Debridement Measurements L x W x D (cm) 6.597 N/A N/A Post Debridement Volume: (cm) Callus: Yes N/A N/A Periwound Skin Texture: Excoriation: No Induration: No Crepitus: No Rash: No Scarring: No Dry/Scaly: Yes N/A N/A Periwound Skin Moisture: Maceration: No Erythema: Yes N/A N/A Periwound Skin Color: Atrophie Blanche: No Cyanosis: No Ecchymosis: No Hemosiderin Staining: No Mottled: No Pallor: No Rubor: No No Abnormality N/A N/A Temperature: Debridement N/A N/A Procedures Performed: Treatment Notes Electronic Signature(s) Signed: 10/26/2022 11:14:07 AM By: Fredirick Maudlin MD FACS Entered By: Fredirick Maudlin on 10/26/2022 11:14:07 Riner, Tamarra M ([id number].pdf Page 4 of 8 -------------------------------------------------------------------------------- Multi-Disciplinary Care Plan Details Patient Name: Date of Service: Skare, [name] 10/26/2022 10:15 A M Medical Record Number: [id number] Patient Account Number: [id number] Date of Birth/Sex: Treating RN: 1960-09-23 (62 y.o. [name] Primary Care Avyana Puffenbarger: [name] Other Clinician: Referring Rajanee Schuelke: Treating Jacobb Alen/Extender: Louann Sjogren in Treatment: 14 Active Inactive Venous Leg Ulcer Nursing Diagnoses: Potential for venous Insuffiency (use before diagnosis confirmed) Goals: Patient will maintain optimal edema control Date Initiated: 07/19/2022 Target Resolution Date: 11/10/2022 Goal Status: Active Interventions: Assess peripheral edema status every visit. Treatment Activities: Therapeutic compression applied : 07/19/2022 Notes: Electronic Signature(s) Signed: 10/26/2022 1:04:45 PM By: Dellie Catholic RN Entered By: Dellie Catholic on 10/26/2022 11:04:47 -------------------------------------------------------------------------------- Pain Assessment Details Patient Name: Date of  Service: Chisholm, [name] 10/26/2022 10:15 A M Medical Record Number: [id number] Patient Account Number: [id number] Date of Birth/Sex: Treating RN: 1960/09/14 (62 y.o. [name] Primary Care Sowmya Partridge: [name] Other Clinician: Referring Yerlin Gasparyan: Treating Layce Sprung/Extender: Louann Sjogren in Treatment: 14 Active Problems Location of Pain Severity and Description of Pain Patient Has Paino Yes Site Locations Pain Location: Votta, Meenakshi M ([id number].pdf Page 5 of 8 Pain Location: Generalized Pain With Dressing Change: Yes Duration of the Pain. Constant / Intermittento Constant Rate the pain. Current Pain Level: 7 Worst Pain Level: 10 Least Pain Level: 3 Tolerable Pain Level: 5 Character of Pain Describe the Pain: Tender Pain Management and Medication Current Pain Management: Medication: Yes Cold Application: No Rest: Yes Massage: No Activity: No T.E.N.S.: No Heat Application: No Leg drop or elevation: No Is the Current Pain Management Adequate: Adequate How does your wound impact your activities of daily livingo Sleep: No Bathing: No Appetite: No Relationship With Others: No Bladder Continence: No Emotions: No Bowel Continence: No Work: No Toileting: No Drive: No Dressing: No Hobbies: No  Electronic Signature(s) Signed: 10/26/2022 1:04:45 PM By: Dellie Catholic RN Entered By: Dellie Catholic on 10/26/2022 11:03:52 -------------------------------------------------------------------------------- Patient/Caregiver Education Details Patient Name: Date of Service: [name], [name] 2/15/2024andnbsp10:15 A M Medical Record Number: [id number] Patient Account Number: [id number] Date of Birth/Gender: Treating RN: 1960-10-13 (62 y.o. [name] Primary Care Physician: [name] Other Clinician: Referring Physician: Treating Physician/Extender: Louann Sjogren in Treatment: 14 Education Assessment Education Provided To: Patient Education Topics Provided Wound/Skin Impairment: Methods: Explain/Verbal Responses: Return demonstration correctly Electronic Signature(s) Signed: 10/26/2022 1:04:45 PM By: Dellie Catholic RN [place], [place] ([id number].pdf Page 6 of 8 Entered By: Dellie Catholic on 10/26/2022 11:05:03 -------------------------------------------------------------------------------- Wound Assessment Details Patient Name: Date of Service: Storie, [name] 10/26/2022 10:15 A M Medical Record Number: [id number] Patient Account Number: [id number] Date of Birth/Sex: Treating RN: [birth year] (62 y.o. [name] Primary Care Dannelle Rhymes: [name] Other Clinician: Referring Madelene Kaatz: Treating Chavie Kolinski/Extender: Louann Sjogren in Treatment: 14 Wound Status Wound Number: 19 Primary Diabetic Wound/Ulcer of the Lower Extremity Etiology: Wound Location: Left, Plantar Foot Wound Open Wounding Event: Gradually Appeared Status: Date Acquired: 05/17/2022 Comorbid Cataracts, Lymphedema, Asthma, Congestive Heart Failure, Weeks Of Treatment: 14 History: Coronary Artery Disease, Hypertension, Peripheral Arterial Disease, Clustered Wound: No Peripheral Venous Disease, Type II Diabetes, Rheumatoid Arthritis, Neuropathy Photos Wound Measurements Length: (cm) 10 Width: (cm) 2.8 Depth: (cm) 0.3 Area: (cm) 21.991 Volume: (cm) 6.597 % Reduction in Area: 2.1% % Reduction in Volume: 26.6% Epithelialization: Small (1-33%) Tunneling: No Undermining: No Wound Description Classification: Grade 2 Wound Margin: Distinct, outline attached Exudate Amount: Medium Exudate Type: Serosanguineous Exudate Color: red, brown Foul Odor After Cleansing: No Slough/Fibrino Yes Wound Bed Granulation Amount: Large (67-100%) Exposed Structure Granulation Quality: Red,  Pink Fascia Exposed: No Necrotic Amount: Small (1-33%) Fat Layer (Subcutaneous Tissue) Exposed: Yes Necrotic Quality: Eschar, Adherent Slough Tendon Exposed: No Muscle Exposed: No Joint Exposed: No Bone Exposed: No Periwound Skin Texture Texture Color No Abnormalities Noted: No No Abnormalities Noted: No Callus: Yes Atrophie Blanche: No Crepitus: No Cyanosis: No Excoriation: No Ecchymosis: No Induration: No Erythema: Yes Rash: No Hemosiderin Staining: No Scarring: No Mottled: No Pallor: No Christoffel, Junko M ([id number].pdf Page 7 of 8 Pallor: No Moisture Rubor: No No Abnormalities Noted: No Dry / Scaly: Yes Temperature / Pain Maceration: No Temperature: No Abnormality Treatment Notes Wound #19 (Foot) Wound Laterality: Plantar, Left Cleanser Soap and Water Discharge Instruction: May shower and wash wound with dial antibacterial soap and water prior to dressing change. Wound Cleanser Discharge Instruction: Cleanse the wound with wound cleanser prior to applying a clean dressing using gauze sponges, not tissue or cotton balls. Peri-Wound Care Topical Primary Dressing Sorbalgon AG Dressing 6x6 (in/in) Discharge Instruction: Apply to wound bed as instructed Secondary Dressing Woven Gauze Sponge, Non-Sterile 4x4 in Discharge Instruction: Apply over primary dressing as directed. Zetuvit Plus 4x8 in Discharge Instruction: Apply over primary dressing as directed. Secured With Elastic Bandage 4 inch (ACE bandage) Discharge Instruction: Secure with ACE bandage as directed. Kerlix Roll Sterile, 4.5x3.1 (in/yd) Discharge Instruction: Secure with Kerlix as directed. 44M Medipore Soft Cloth Surgical T 2x10 (in/yd) ape Discharge Instruction: Secure with tape as directed. Compression Wrap Compression Stockings Add-Ons Electronic Signature(s) Signed: 10/26/2022 1:04:45 PM By: Dellie Catholic RN Entered By: Dellie Catholic on 10/26/2022  11:08:50 -------------------------------------------------------------------------------- Vitals Details Patient Name: Date of Service: Padmanabhan, [name] 10/26/2022 10:15 A M Medical Record Number: [id number] Patient Account Number: [id number] Date of Birth/Sex: Treating  RN: 20-Jun-1961 (62 y.o. [name] Primary Care Reeve Turnley: [name] Other Clinician: Referring Marsi Turvey: Treating Marqueta Pulley/Extender: Louann Sjogren in Treatment: 14 Vital Signs Time Taken: 09:46 Temperature (F): 98.1 Height (in): 62 Pulse (bpm): 86 Weight (lbs): 284 Respiratory Rate (breaths/min): 18 Body Mass Index (BMI): 51.9 Blood Pressure (mmHg): 134/69 Reference Range: 80 - 120 mg / dl Electronic Signature(s) Inboden, Mariona M ([id number].pdf Page 8 of 8 Signed: 10/26/2022 1:04:45 PM By: Dellie Catholic RN Entered By: Dellie Catholic on 10/26/2022 11:03:15

## 2022-11-06 ENCOUNTER — Ambulatory Visit: Payer: Commercial Managed Care - HMO | Admitting: Physician Assistant

## 2022-11-06 ENCOUNTER — Other Ambulatory Visit: Payer: Self-pay | Admitting: Family Medicine

## 2022-11-06 DIAGNOSIS — M5136 Other intervertebral disc degeneration, lumbar region: Secondary | ICD-10-CM

## 2022-11-06 DIAGNOSIS — H905 Unspecified sensorineural hearing loss: Secondary | ICD-10-CM

## 2022-11-06 DIAGNOSIS — M19041 Primary osteoarthritis, right hand: Secondary | ICD-10-CM

## 2022-11-06 DIAGNOSIS — I5033 Acute on chronic diastolic (congestive) heart failure: Secondary | ICD-10-CM

## 2022-11-06 DIAGNOSIS — B955 Unspecified streptococcus as the cause of diseases classified elsewhere: Secondary | ICD-10-CM

## 2022-11-06 DIAGNOSIS — K589 Irritable bowel syndrome without diarrhea: Secondary | ICD-10-CM

## 2022-11-06 DIAGNOSIS — Z79899 Other long term (current) drug therapy: Secondary | ICD-10-CM

## 2022-11-06 DIAGNOSIS — Z8639 Personal history of other endocrine, nutritional and metabolic disease: Secondary | ICD-10-CM

## 2022-11-06 DIAGNOSIS — I2510 Atherosclerotic heart disease of native coronary artery without angina pectoris: Secondary | ICD-10-CM

## 2022-11-06 DIAGNOSIS — I10 Essential (primary) hypertension: Secondary | ICD-10-CM

## 2022-11-06 DIAGNOSIS — E1165 Type 2 diabetes mellitus with hyperglycemia: Secondary | ICD-10-CM

## 2022-11-06 DIAGNOSIS — L03119 Cellulitis of unspecified part of limb: Secondary | ICD-10-CM

## 2022-11-06 DIAGNOSIS — T8781 Dehiscence of amputation stump: Secondary | ICD-10-CM

## 2022-11-06 DIAGNOSIS — K219 Gastro-esophageal reflux disease without esophagitis: Secondary | ICD-10-CM

## 2022-11-06 DIAGNOSIS — S78111A Complete traumatic amputation at level between right hip and knee, initial encounter: Secondary | ICD-10-CM

## 2022-11-06 DIAGNOSIS — J4541 Moderate persistent asthma with (acute) exacerbation: Secondary | ICD-10-CM

## 2022-11-06 DIAGNOSIS — K760 Fatty (change of) liver, not elsewhere classified: Secondary | ICD-10-CM

## 2022-11-06 DIAGNOSIS — M797 Fibromyalgia: Secondary | ICD-10-CM

## 2022-11-06 DIAGNOSIS — L89152 Pressure ulcer of sacral region, stage 2: Secondary | ICD-10-CM

## 2022-11-06 DIAGNOSIS — E559 Vitamin D deficiency, unspecified: Secondary | ICD-10-CM

## 2022-11-06 DIAGNOSIS — M19072 Primary osteoarthritis, left ankle and foot: Secondary | ICD-10-CM

## 2022-11-06 DIAGNOSIS — L2089 Other atopic dermatitis: Secondary | ICD-10-CM

## 2022-11-06 DIAGNOSIS — Z8619 Personal history of other infectious and parasitic diseases: Secondary | ICD-10-CM

## 2022-11-06 DIAGNOSIS — M351 Other overlap syndromes: Secondary | ICD-10-CM

## 2022-11-06 DIAGNOSIS — I739 Peripheral vascular disease, unspecified: Secondary | ICD-10-CM

## 2022-11-06 DIAGNOSIS — G894 Chronic pain syndrome: Secondary | ICD-10-CM

## 2022-11-09 ENCOUNTER — Encounter (HOSPITAL_BASED_OUTPATIENT_CLINIC_OR_DEPARTMENT_OTHER): Payer: Commercial Managed Care - HMO | Admitting: General Surgery

## 2022-11-12 ENCOUNTER — Other Ambulatory Visit: Payer: Self-pay | Admitting: Family Medicine

## 2022-11-13 ENCOUNTER — Other Ambulatory Visit: Payer: Self-pay | Admitting: Family Medicine

## 2022-11-13 NOTE — Telephone Encounter (Signed)
Ok to refill 

## 2022-11-14 ENCOUNTER — Encounter (HOSPITAL_BASED_OUTPATIENT_CLINIC_OR_DEPARTMENT_OTHER): Payer: Commercial Managed Care - HMO | Attending: General Surgery | Admitting: General Surgery

## 2022-11-14 DIAGNOSIS — I110 Hypertensive heart disease with heart failure: Secondary | ICD-10-CM | POA: Insufficient documentation

## 2022-11-14 DIAGNOSIS — I2510 Atherosclerotic heart disease of native coronary artery without angina pectoris: Secondary | ICD-10-CM | POA: Diagnosis not present

## 2022-11-14 DIAGNOSIS — M869 Osteomyelitis, unspecified: Secondary | ICD-10-CM | POA: Diagnosis not present

## 2022-11-14 DIAGNOSIS — E1151 Type 2 diabetes mellitus with diabetic peripheral angiopathy without gangrene: Secondary | ICD-10-CM | POA: Diagnosis not present

## 2022-11-14 DIAGNOSIS — Z89611 Acquired absence of right leg above knee: Secondary | ICD-10-CM | POA: Insufficient documentation

## 2022-11-14 DIAGNOSIS — E11621 Type 2 diabetes mellitus with foot ulcer: Secondary | ICD-10-CM | POA: Diagnosis not present

## 2022-11-14 DIAGNOSIS — E1140 Type 2 diabetes mellitus with diabetic neuropathy, unspecified: Secondary | ICD-10-CM | POA: Diagnosis not present

## 2022-11-14 DIAGNOSIS — L97423 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: Secondary | ICD-10-CM | POA: Diagnosis not present

## 2022-11-14 DIAGNOSIS — I872 Venous insufficiency (chronic) (peripheral): Secondary | ICD-10-CM | POA: Insufficient documentation

## 2022-11-14 DIAGNOSIS — G8250 Quadriplegia, unspecified: Secondary | ICD-10-CM | POA: Insufficient documentation

## 2022-11-14 DIAGNOSIS — I890 Lymphedema, not elsewhere classified: Secondary | ICD-10-CM | POA: Insufficient documentation

## 2022-11-14 DIAGNOSIS — J45909 Unspecified asthma, uncomplicated: Secondary | ICD-10-CM | POA: Insufficient documentation

## 2022-11-14 DIAGNOSIS — M3500 Sicca syndrome, unspecified: Secondary | ICD-10-CM | POA: Insufficient documentation

## 2022-11-14 DIAGNOSIS — E039 Hypothyroidism, unspecified: Secondary | ICD-10-CM | POA: Diagnosis not present

## 2022-11-14 DIAGNOSIS — I5032 Chronic diastolic (congestive) heart failure: Secondary | ICD-10-CM | POA: Diagnosis not present

## 2022-11-14 DIAGNOSIS — G40909 Epilepsy, unspecified, not intractable, without status epilepticus: Secondary | ICD-10-CM | POA: Insufficient documentation

## 2022-11-14 DIAGNOSIS — M069 Rheumatoid arthritis, unspecified: Secondary | ICD-10-CM | POA: Diagnosis not present

## 2022-11-14 DIAGNOSIS — Z89511 Acquired absence of right leg below knee: Secondary | ICD-10-CM | POA: Insufficient documentation

## 2022-11-15 ENCOUNTER — Ambulatory Visit (INDEPENDENT_AMBULATORY_CARE_PROVIDER_SITE_OTHER): Payer: Commercial Managed Care - HMO | Admitting: Pharmacist

## 2022-11-15 DIAGNOSIS — Z794 Long term (current) use of insulin: Secondary | ICD-10-CM

## 2022-11-15 DIAGNOSIS — E1165 Type 2 diabetes mellitus with hyperglycemia: Secondary | ICD-10-CM

## 2022-11-15 NOTE — Progress Notes (Unsigned)
Pharmacy Note  11/15/2022 Name: [name] MRN: [id number] DOB: Jun 10, 1961  Subjective: [name] is a 62 y.o. year old female who is a primary care patient of Shelda Pal, DO. Clinical Pharmacist Practitioner referral was placed to assist with medication management.    Engaged with patient by telephone for follow up visit today.  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 She has had right foot amputated Patient is currently using [place] '10mg'$  daily, metformin ER '500mg'$  - take 4 tablets = '2000mg'$  daily and U500 insulin - 5 to 50 units 2 to 3 times a day. Patient reports she is using a sliding scale based on her blood glucose Blood glucose 70 to 80 - no insulin Blood glucose 80 to 110 - 5 units Blood glucose 111 to 120 - 10 units Blood glucose 121 to 130 - 15 units Blood glucose 131 to 150 - 20 units Blood glucose 151 to 175 - 25 units Blood glucose 176 to 200 - 30 units Blood glucose 201 to 225 - 35 units Blood glucose 226 to 250 - 40 units Blood glucose 251 to 275 - 45 units Blood glucose > 275 - 50 units  She has tried Victoza in past but cause severe, debilitating nausea. Discussed other GLP1s in past but she has been hesitant to try in the past due to side effects she experienced with Victoza.   She is using the Prodigy glucometer but does not feel that it is accurate but cost for 50 strips is only $6.99.  We got DexCom 7 approved by her insurance but per patient she was able to get it to work because it is not compatible with her phone. I offered in person visit last month to either see if we could get DexCom 7 to work or try the [place] 2 or 3 sensors but patient cancelled appointment. She has been in contact with [employer] representative and would like to try that Continuous Glucose sensor but there is no one in the area that inserts the [employer] - this sensor is implanted in arm every 3 months. Closest [employer] insertion  facility is either [place] or [place]. I also am not 100% sure that her phone would be compatible with this system either.   She reports that over the last month blood glucose usually 100 to 200. She feels that her last A1c was elevated due to infection and because she was missing her mid day dose. She reports she only misses doses when blood glucose is < 80.  Hyperlipidemia with intolerance to statins. She also stopped ezetimibe because it was causing nausea. No current lipid lowering therapy. Patient has history of CAD with angioplasty and PVD. She has been referred to lipids clinic in past to discuss PCSK9 therapy but she has declined to schedule appointment.   Patient has notation of past myalgias / muscle pain with statins - rosuvastatin (tried in 2014 and 2016), lovastatin, pravastatin and atorvastatin (tired in 2014). She also had rash with Niaspan and anaphylaxis with Fish Oil.  Patient has diagnosis of mixed connective tissue disease which might make her more sensitive to myalgias with statins.     Objective: Review of patient status, including review of consultants reports, laboratory and other test data, was performed as part of comprehensive evaluation and provision of chronic care management services.   Lab Results  Component Value Date   CREATININE 0.66 07/05/2022   CREATININE 0.70 06/19/2022  CREATININE 0.84 06/17/2022    Lab Results  Component Value Date   HGBA1C 9.2 (H) 05/26/2022       Component Value Date/Time   CHOL 158 05/26/2022 1501   CHOL 176 05/04/2020 0947   TRIG 158 (H) 05/26/2022 1501   HDL 50 05/26/2022 1501   HDL 41 05/04/2020 0947   CHOLHDL 3.2 05/26/2022 1501   VLDL 44.6 (H) 10/27/2020 0809   LDLCALC 83 05/26/2022 1501   LDLDIRECT 121.0 10/27/2020 0809     Clinical ASCVD: Yes  The 10-year ASCVD risk score (Arnett DK, et al., 2019) is: 10.6%   Values used to calculate the score:     Age: 65 years     Sex: Female     Is Non-Hispanic African  American: No     Diabetic: Yes     Tobacco smoker: No     Systolic Blood Pressure: [id number] mmHg     Is BP treated: Yes     HDL Cholesterol: 50 mg/dL     Total Cholesterol: 158 mg/dL    BP Readings from Last 3 Encounters:  10/27/22 (!) 145/73  09/22/22 (!) 114/58  08/22/22 113/67     Allergies  Allergen Reactions   Fish Allergy Anaphylaxis    INCLUDES OMEGA 3 OILS   Fish Oil Anaphylaxis and Swelling    THROAT SWELLS INCLUDES FISH AS A CLASS   Omega-3 Fatty Acids Swelling    Throat swelling  Has tolerated Glucerna nutritional drinks    Victoza [Liraglutide] Nausea And Vomiting    Patient reported severe, debilitating nausea   Crestor [Rosuvastatin] Other (See Comments)    Extreme joint pain, to this & other statins   Gabapentin Anxiety    Anxiety on high doses   Lovastatin Other (See Comments)    EXTREME JOINT PAIN   Metronidazole Nausea Only and Swelling    Headache and shakes Nausea and vomiting Pt tolerating IV 06/14/22   Red Yeast Rice [Cholestin] Other (See Comments)    Muscle pain and severe joint pain.    Rotigotine Swelling    (Neupro) Extreme edema    Atrovent Hfa [Ipratropium Bromide Hfa] Other (See Comments)    wheezing   Iodine Swelling   Morphine    Other Other (See Comments)    Surgical Staples causes redness/infection per patient.   Pepto-Bismol [Bismuth Subsalicylate] Nausea And Vomiting    Extreme vomiting   Enalapril Cough   Suprep [Na Sulfate-K Sulfate-Mg Sulf] Nausea And Vomiting   Tape Other (See Comments)    Electrodes causes skin breakdown    Medications Reviewed Today     Reviewed by Bayard Hugger, NP (Nurse Practitioner) on 10/27/22 at [phone]  Med List Status: <None>   Medication Order Taking? Sig Documenting Provider Last Dose Status Informant  acetaminophen (TYLENOL) 500 MG tablet [id number] Yes Take 500 mg by mouth every 8 (eight) hours as needed for mild pain. [provider] Taking Active Self  albuterol (PROVENTIL) (2.5  MG/3ML) 0.083% nebulizer solution [id number] Yes USE 1 VIAL IN NEBULIZER EVERY 4 HOURS AS NEEDED FOR WHEEZING FOR SHORTNESS OF BREATH Shelda Pal, DO Taking Active   ascorbic acid (VITAMIN C) 500 MG tablet [id number] Yes Take 1 tablet (500 mg total) by mouth 2 (two) times daily.  Patient taking differently: Take 500 mg by mouth daily.   Bary Leriche, PA-C Taking Active Self  aspirin EC 81 MG tablet [id number] Yes Take 1 tablet (81 mg total) by mouth every evening. [name]  Eddie Dibbles, DO Taking Active Self  augmented betamethasone dipropionate (DIPROLENE AF) 0.05 % cream [id number] Yes Apply topically 2 (two) times daily.  Patient taking differently: Apply 1 Application topically 2 (two) times daily as needed (rash).   Shelda Pal, DO Taking Active Self  Continuous Blood Gluc Sensor (DEXCOM G7 SENSOR) MISC [id number] Yes USE TO CHECK FOR BLOOD SUGAR CONTINUOUSLY- CHANGE SENSOR EVERY 10 DAYS Wendling, Crosby Oyster, DO Taking Active   dapagliflozin propanediol (FARXIGA) 10 MG TABS tablet [id number] Yes Take 1 tablet (10 mg total) by mouth daily before breakfast. Shelda Pal, DO Taking Active Self  diclofenac Sodium (VOLTAREN) 1 % GEL [id number] Yes Apply 2 g topically 4 (four) times daily. Izora Ribas, MD Taking Active   DILT-XR 240 MG 24 hr capsule [id number] Yes Take 1 capsule (240 mg total) by mouth daily.  Patient taking differently: Take 240 mg by mouth at bedtime.   Sueanne Margarita, MD Taking Active Self  EPINEPHrine 0.3 mg/0.3 mL IJ SOAJ injection [id number] Yes Inject 0.3 mg into the muscle as needed for anaphylaxis. Shelda Pal, DO Taking Active   ferrous gluconate (FERGON) 324 MG tablet [id number] Yes Take 1 tablet (324 mg total) by mouth daily with breakfast. Shelly Coss, MD Taking Active   fluticasone (FLONASE) 50 MCG/ACT nasal spray [id number] Yes USE 2 SPRAY(S) IN EACH NOSTRIL ONCE DAILY AS NEEDED FOR ALLERGIES OR  RHINITIS  Patient  taking differently: Place 2 sprays into both nostrils daily as needed for allergies or rhinitis.   Shelda Pal, DO Taking Active Self  furosemide (LASIX) 20 MG tablet [id number] Yes Take 2 tablets (40 mg total) by mouth daily. Shelda Pal, DO Taking Active   gabapentin (NEURONTIN) 400 MG capsule [id number] Yes Take 1 capsule (400 mg total) by mouth 4 (four) times daily. TAKE 1 CAPSULE BY MOUTH IN THE MORNING AND 1 AT NOON AND 2 AT BEDTIME Strength: 400 mg Izora Ribas, MD Taking Active   GLUCOMANNAN PO [id number] Yes Take 1,995 mg by mouth daily. [provider] Taking Active Self  glucose blood test strip [id number] Yes 1 each by Other route 4 (four) times daily. Contour Next strips Love, Ivan Anchors, PA-C Taking Active Self  guaiFENesin (MUCINEX) 600 MG 12 hr tablet [id number] Yes Take 600 mg by mouth 2 (two) times daily as needed for cough or to loosen phlegm. [provider] Taking Active Self  HUMULIN R 500 UNIT/ML injection [id number] Yes INJECT 0.08-0.3 MLS (40-150 UNITS TOTAL) INTO THE SKIN THREE TIMES DAILY WITH MEALS Nani Ravens, Crosby Oyster, DO Taking Active   hydrocortisone cream 1 % [id number] Yes Apply 1 application topically 3 (three) times daily as needed for itching (minor skin irritation). Bary Leriche, PA-C Taking Active Self  hydroxychloroquine (PLAQUENIL) 200 MG tablet [id number] Yes Take '200mg'$  by mouth twice daily Monday through Friday only. None on Saturday or Sunday. Bo Merino, MD Taking Active Self  INSULIN SYRINGE 1CC/29G (B-D INSULIN SYRINGE) 29G X 1/2" 1 ML MISC [id number] Yes Use three times daily with insulin.  DX E11.9 Shelda Pal, DO Taking Active Self  isosorbide mononitrate (IMDUR) 30 MG 24 hr tablet [id number] Yes Take 1 tablet by mouth once daily  Patient taking differently: Take 30 mg by mouth at bedtime.   Sueanne Margarita, MD Taking Active Self  levocetirizine (XYZAL) 5 MG tablet [id number] Yes TAKE 1 TABLET  BY MOUTH ONCE DAILY IN THE EVENING  Patient taking differently: Take 5 mg by mouth  every evening.   Shelda Pal, DO Taking Active Self  levothyroxine (SYNTHROID) 200 MCG tablet [id number] Yes Take 1 tablet (200 mcg total) by mouth daily before breakfast. Shelda Pal, DO Taking Active   lidocaine (LIDODERM) 5 % [id number] Yes Place 1 patch onto the skin every 12 (twelve) hours. Remove & Discard patch within 12 hours or as directed Bayard Hugger, NP Taking Active   metFORMIN (GLUCOPHAGE-XR) 500 MG 24 hr tablet [id number] Yes Take 2 tablets (1,000 mg total) by mouth 2 (two) times daily. Shelda Pal, DO Taking Active   Multiple Vitamins-Minerals (WOMENS 50+ MULTI VITAMIN PO) [id number] Yes Take 1 tablet by mouth daily. [provider] Taking Active Self  mupirocin ointment (BACTROBAN) 2 % [id number] Yes Apply 1 application topically 2 (two) times daily as needed (wound care). Shelda Pal, DO Taking Active Self  naloxone [facility]) nasal spray 4 mg/0.1 mL [id number] Yes Place 1 spray into the nose as needed (opoid overdose). [provider] Taking Active Self  nitroGLYCERIN (NITROSTAT) 0.4 MG SL tablet [id number] Yes DISSOLVE ONE TABLET UNDER THE TONGUE EVERY 5 MINUTES AS NEEDED FOR CHEST PAIN.  DO NOT EXCEED A TOTAL OF 3 DOSES IN 15 MINUTES  Patient taking differently: Place 0.4 mg under the tongue every 5 (five) minutes as needed for chest pain.   Sueanne Margarita, MD Taking Active Self  nystatin (MYCOSTATIN/NYSTOP) powder [id number] Yes Apply 1 Application topically 3 (three) times daily. RLE  Patient taking differently: Apply 1 Application topically 3 (three) times daily as needed (skin rash). RLE   Shelda Pal, DO Taking Active Self  omeprazole (PRILOSEC) 40 MG capsule [id number] Yes Take 1 capsule by mouth once daily  Patient taking differently: Take 40 mg by mouth at bedtime.   Shelda Pal, DO Taking Active Self   Oxycodone HCl 20 MG TABS [id number]  Take 1 tablet (20 mg total) by mouth every 8 (eight) hours as needed. Bayard Hugger, NP  Active   oxymetazoline (AFRIN) 0.05 % nasal spray [id number] Yes Place 2 sprays into both nostrils 2 (two) times daily as needed for congestion. [provider] Taking Active Self  Polyethyl Glycol-Propyl Glycol (SYSTANE) 0.4-0.3 % SOLN [id number] Yes Place 1-2 drops into both eyes in the morning. [provider] Taking Active Self  pramipexole (MIRAPEX) 1 MG tablet [id number] Yes TAKE 1 TABLET BY MOUTH THREE TIMES DAILY AS NEEDED AND  TAKE  1/2  TABLET  TWICE  DAILY  AS  NEEDED  FOR  BREAKTHROUGH  RESTLESS LEG PAIN Shelda Pal, DO Taking Active   pregabalin (LYRICA) 50 MG capsule [id number] Yes Take 1 capsule (50 mg total) by mouth 3 (three) times daily. Izora Ribas, MD Taking Active   promethazine (PHENERGAN) 25 MG tablet [id number] Yes Take 1 tablet (25 mg total) by mouth every 8 (eight) hours as needed for vomiting or nausea. Shelda Pal, DO Taking Active Self  saccharomyces boulardii (FLORASTOR) 250 MG capsule [id number] Yes Take 250 mg by mouth 3 (three) times daily. [provider] Taking Active Self           Med Note Antony Contras, Jenene Slicker Jan 30, 2022  3:40 PM) Only takes when she takes antibiotic  VENTOLIN HFA 108 (90 Base) MCG/ACT inhaler [id number] Yes INHALE 2 PUFFS BY MOUTH EVERY 4 HOURS AS NEEDED FOR WHEEZING AND  FOR  SHORTNESS  OF  BREATH  Patient taking differently: Inhale 2 puffs into the  lungs every 4 (four) hours as needed for wheezing or shortness of breath.   Shelda Pal, DO Taking Active Self  Vitamin D, Ergocalciferol, (DRISDOL) 1.25 MG (50000 UNIT) CAPS capsule [id number] Yes TAKE 1 CAPSULE BY MOUTH ONCE A WEEK ON  FRIDAYS Shelda Pal, DO Taking Active   Wound Dressings (ELTA DERMAL WOUND DRESSING EX) [id number] Yes Apply topically. [provider] Taking Active              Patient Active Problem List   Diagnosis Date Noted   Osteomyelitis ([place]) 07/07/2022   Medication management 07/07/2022   Diabetic ulcer of left midfoot associated with diabetes mellitus due to underlying condition, with necrosis of muscle ([place])    Diabetic foot infection ([place]) 06/14/2022   Sacral decubitus ulcer, stage II ([place]) 12/24/2021   Class 3 obesity ([place]) 12/23/2021   Iron deficiency anemia 12/23/2021   Acute on chronic diastolic CHF (congestive heart failure) ([place]) 12/22/2021   Hyperkalemia 12/22/2021   Left leg cellulitis 12/20/2021   Peripheral vascular disease, unspecified ([place]) 10/10/2021   Dehiscence of amputation stump (H[facility])    Acute blood loss anemia 06/24/2021   Postoperative wound infection 06/24/2021   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 ([place])    Bacteremia due to Enterococcus 05/28/2021   Digestive disorder 05/18/2021   Bilateral lower extremity edema 05/02/2021   Statin myopathy 03/04/2021   Aortic stenosis 03/04/2021   Sjogren's syndrome ([place]) 02/11/2020   Cutaneous abscess of right foot    Statin intolerance 08/16/2019   Gram-negative bacteremia 10/18/2018   Streptococcal bacteremia 10/18/2018   CAD S/P percutaneous coronary angioplasty 04/19/2018   Essential hypertension    Moderate persistent asthma 03/21/2018   NAFLD (nonalcoholic fatty liver disease)    Recurrent cellulitis of lower extremity 01/02/2018   Cellulitis of lower leg 01/02/2018   Chronic diarrhea 05/08/2017   H/O Clostridium difficile infection 05/08/2017   Rectal bleeding 05/08/2017   Hyperbilirubinemia 04/29/2016   Hyponatremia 04/29/2016   Cellulitis of right foot 03/09/2016   Allergic rhinitis due to pollen 02/15/2016   Anaphylactic reaction due to food 02/10/2016   Type 2 diabetes mellitus with hyperglycemia, with long-term current use of insulin ([place]) 02/10/2016   Gastroesophageal reflux disease without esophagitis 02/10/2016   Atopic  eczema 02/10/2016   Cervical nerve root disorder 12/15/2015   Lumbar radiculopathy 12/15/2015   Daytime somnolence 11/26/2015   Venous stasis dermatitis of both lower extremities 02/11/2015   Morbid obesity ([place]) 06/19/2014   Abnormal LFTs 03/26/2014   B-complex deficiency 03/26/2014   Benign essential HTN 03/26/2014   Chronic pain associated with significant psychosocial dysfunction 03/26/2014   Diaphragmatic hernia 03/26/2014   Gastroesophageal reflux disease 03/26/2014   Hammer toe 03/26/2014   H/O neoplasm 03/26/2014   Adaptive colitis 03/26/2014   Deafness, sensorineural 03/26/2014   Fibromyalgia 01/20/2014   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    Assessment / Plan: Uncontrolled DM:  Discussed U500 insulin dosing.  Patient is interested in using Continuous Glucose Monitor if covered by her insurance. Prior authorization sent to [employer] - approved.  Copay is $0.   Hyperlipidemia -  LDL goal < 55 with history of intolerance to statins due to myalgias. Also has not been able to take ezetimbe, Niacin and Fish oil Patient has stopped ezetimbe due to causing nausea.  Discussed trial of PCSK9. Patient is open to trying Repatha if covered. needs prior authorization - prior authorization started/ pending.   Follow Up:  Face to Face appointment with care management team member scheduled for: 1 to 2 week for Continuous Glucose Monitor education.   Cherre Robins, PharmD Clinical Pharmacist [place] [phone]

## 2022-11-16 MED ORDER — ONETOUCH DELICA LANCETS 33G MISC: 12 refills | Status: AC

## 2022-11-16 MED ORDER — ONETOUCH VERIO FLEX SYSTEM W/DEVICE KIT: PACK | 0 refills | Status: DC

## 2022-11-16 MED ORDER — ONETOUCH VERIO VI STRP: ORAL_STRIP | 12 refills | Status: DC

## 2022-11-16 NOTE — Progress Notes (Signed)
[name] ([id number]) [id number] Page 1 of 13 Visit Report for 11/14/2022 Chief Complaint Document Details Patient Name: Date of Service: [name], [name] [name] 11/14/2022 1:15 PM Medical Record Number: [id number] Patient Account Number: [id number] Date of Birth/Sex: Treating RN: 09-27-1960 (62 y.o. F) Primary Care Provider: [name] Other Clinician: Referring Provider: Treating Provider/Extender: Louann Sjogren in Treatment: 16 Information Obtained from: Patient Chief Complaint 10/17/2021; multiple scattered wounds to the abdomen, posterior left leg, left labia and sacrum 07/19/2022: left foot DFU Electronic Signature(s) Signed: 11/14/2022 2:23:55 PM By: Fredirick Maudlin MD FACS Entered By: Fredirick Maudlin on 11/14/2022 14:23:55 -------------------------------------------------------------------------------- Debridement Details Patient Name: Date of Service: [name], [name] [name] 11/14/2022 1:15 PM Medical Record Number: [id number] Patient Account Number: [id number] Date of Birth/Sex: Treating RN: 1961/06/25 (62 y.o. [name] Primary Care Provider: [name] Other Clinician: Referring Provider: Treating Provider/Extender: Louann Sjogren in Treatment: 16 Debridement Performed for Assessment: Wound #19 [place] Performed By: Physician Fredirick Maudlin, MD Debridement Type: Debridement Severity of Tissue Pre Debridement: Fat layer exposed Level of Consciousness (Pre-procedure): Awake and Alert Pre-procedure Verification/Time Out Yes - 13:55 Taken: Start Time: 13:55 Pain Control: Lidocaine 4% T opical Solution T Area Debrided (L x W): otal 9.9 (cm) x 3.5 (cm) = 34.65 (cm) Tissue and other material debrided: Non-Viable, Callus, Eschar, Slough, Skin: Epidermis, Biofilm, Slough Level: Skin/Epidermis Debridement Description: Selective/Open Wound Instrument:  Curette Bleeding: Minimum Hemostasis Achieved: Pressure Procedural Pain: 0 Post Procedural Pain: 0 Response to Treatment: Procedure was tolerated well Level of Consciousness (Post- Awake and Alert procedure): Post Debridement Measurements of Total Wound Length: (cm) 9.9 Width: (cm) 3.5 Depth: (cm) 0.4 Volume: (cm) 10.886 Character of Wound/Ulcer Post Debridement: Improved Severity of Tissue Post Debridement: Fat layer exposed Post Procedure Diagnosis Same as Pre-procedure Notes Scribed for Dr. Celine Ahr by Baruch Gouty, RN Electronic Signature(s) Kutscher, [name] ([id number]) [id number] Page 2 of 13 Signed: 11/14/2022 5:11:49 PM By: Baruch Gouty RN, BSN Signed: 11/15/2022 8:23:25 AM By: Fredirick Maudlin MD FACS Entered By: Baruch Gouty on 11/14/2022 13:59:02 -------------------------------------------------------------------------------- HPI Details Patient Name: Date of Service: [name], [name] [name] 11/14/2022 1:15 PM Medical Record Number: [id number] Patient Account Number: [id number] Date of Birth/Sex: Treating RN: 20-Jan-1961 (62 y.o. F) Primary Care Provider: [name] Other Clinician: Referring Provider: Treating Provider/Extender: Louann Sjogren in Treatment: 16 History of Present Illness HPI Description: 07/23/2019 on evaluation today patient presents with a myriad of wounds noted at multiple locations over her right heel, right lower extremity, and abdominal region. She also has bilateral lower extremity lymphedema which is quite significant as well. Incidentally she also has congestive heart failure and hypertension. She also is obese. With that being said I think all this is contributing as well to her lower extremity edema which is very much uncontrolled. It seems like its been at least several months since she is worn any compression according to what she tells me. With that being said I am not sure  exactly when that would have been. She does have fibrotic changes in the lower extremity secondary to lymphedema worse on the left than the right. She did show me pictures of wound she had on the anterior portion of her shin which was quite significant fortunately that has healed. These issues have been intermittent at this time. Again I think that with appropriate compression therapy she may actually be doing better than what we are seeing at this point but again I am not  really sure that she is ever been extremely compliant with that. Fortunately there is no signs of active infection at this time. No fever chills noted. As far as the abdominal ulcer she initially had one wound that she thinks may have been a bug bite. Subsequently she was put a dressing on this and she states that the tape pulled skin off on other locations causing other wounds that have not healed that is been about 3 months. The wounds on her lower extremities have been intermittent over 3 years. This includes the heel. 11/19; this is a patient that I have not seen previously. She was admitted to our clinic last week with multiple wounds including several superficial circular areas on her abdomen predominantly right upper quadrant. Also 1 in her umbilicus. She has an area on her right lateral malleolus which was new today. She has bilateral lower extremity edema with very significant stasis dermatitis on the left anterior tibial area. She has tightly adherent skin in her lower extremities probably secondary to cutaneous fibrosis in the area. We put her in compression last week. She comes in with the dressings reasonably saturated. 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 She has multiple areas on her arms from scratching. 12/3; the  patient I saw for the first time 2 weeks ago. She has 3 small circular areas on her abdomen 2 in the right upper quadrant one on her umbilicus. We have been using silver alginate to this area. She also had an area on her right lateral malleolus and right heel and right medial malleolus. We have been using silver alginate here under compression. She does not have an open area on the left leg today. We are going to order her stockings for the left leg. She clearly has chronic lymphedema in these areas. X-ray of the foot from 10/20 did not show any fracture or dislocation. The heel wound was noted there was no evidence of osteomyelitis She complains of generalized pruritus. 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 She has longstanding lymphedema. Although she does not have known liver disease I looked at his CT scan of the abdomen from February of this year that showed hepatic steatosis 12/11; patient still has no open area on the left leg we transitioned her into a stocking she had. Her stockings from  are supposed to be arriving later today which will be the stockings of choice. The only wound remaining on the right is the right heel 2 small superficial open areas. Everything else is well on its way to healing in the right leg few small excoriations. She has 1 major area remaining on her abdomen the rest seem to be healing 12/22 patient still has no open area on the left leg and she is still in a stocking. She had still 2 open areas on the tip of her right heel. These look like pressure related areas although the patient is not really certain how this is happening. She has an open heeled shoe that we provided. Still has 1 open area on the right lateral abdomen The patient has complaints of generalized pruritus. She has multiple excoriated areas on her abdomen that of close over her arms or thighs which I think are from scratching. She tells me that she has  never had a basic work-up  for this which might include basic lab work, liver functions, kidney functions, thyroid etc. She might also benefit from a dermatologist 12/29; the patient has a deeper larger more painful wound on the tip of her right heel. Again these look like pressure ulcers although she wears an open toed heel pads or heel at night to prevent it from hitting the mattress etc. They tell me and remind me that this is been present on and off for about 2 years that has closed over but then will reopen. I did look over her lab work that was available in [place]. She does not have elevated liver function tests or creatinine. I was not able to see a TSH. This was in response to her complaints of generalized itching and a itch/scratch cycle. I also noted that she is on OxyContin and oxycodone and of course narcotics can cause generalized pruritus as a side effect Readmission: History From [place], [place] Last Week: 03/29/2021 this is a patient who presents for initial evaluation here in the clinic though I have seen her 1 time previously in 2020 this was in [place]. That was actually her initial visit therefore a heel ulceration. Subsequently that did not healing she actually saw me the 1 time in November and then subsequently saw Dr. Dellia Nims through the end of December where she apparently was healed. 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 This is on the right. With that being said right now she has a basically region on the lateral portion of the ray site where she is applying more pressure especially as her foot seems to be starting to turn in and this has become an increasingly significant issue for her to be honest. She does see Dr. Sharol Given and Dr. Sharol Given has had her on doxycycline for quite a bit of time here. Subsequently she is just now wrapping that out. I think that she  probably would be benefited by continue the doxycycline for a time while we can work through what we need to do with things here. She does have evidence of osteomyelitis based on what I saw on the MRI today. This obviously is unfortunate and definitely not something that she was hoping to hear. With that being said I do believe that she would potentially be a strong candidate for hyperbaric oxygen therapy. Also believe she could be a candidate for total contact cast though we have to be cautious due to the fact that how her foot is bending inward I think that this is good to be a little bit of a concern. We will definitely have to keeping a close eye on things. She does have a history of diabetes mellitus type 2. She also other than the amputation has a medical history positive for hypertension. She has never undergone hyperbaric oxygen therapy previously I did show her the chamber we did talk a little bit about it today as well. Admission [facility]: o 04/06/2021 Patient presents here in [place] today for evaluation. Again she is establishing care here after I saw her last week in [place]. Obviously I think that she is doing extremely well at this point which is great news and in general I am extremely pleased with where things stand from the standpoint of the appearance of the wound. Obviously this is not significantly smaller but does look a lot cleaner I think using  the [facility] has been of benefit over the past week. Nonetheless she still has a wound with issues with pressure here which I think is good to be an ongoing issue. Also think that the osteomyelitis which is chronic is also getting ongoing issue. She does want to try to do what she can to prevent this from worsening and ending with a below-knee amputation. T that end I do think that getting her into the hyperbaric oxygen chamber would be what we need to do. She has recently been seen by cardiology o Sliva, Jeanna [name]  ([id number]) [id number] Page 3 of 13 and subsequently well as far as that is concerned. Her ejection fraction was appropriate for hyperbarics and everything seems to be doing great in that regard. She has not had a recent chest x-ray she is a former smoker around 15 years ago she quit. Nonetheless I do believe with her asthma we do want to do a chest x-ray just to make sure everything is okay before proceeding with the hyperbarics although we can go ahead and see about getting the approval. I also think she is going require some sharp debridement and around this wound we also have to contact her insurance for prior approval on this as well. 04/13/2021 upon evaluation today patient appears to be doing a little bit better in regard to her leg in fact the swelling is dramatically better. Very pleased with where things stand in that regard. Fortunately there does not appear to be any signs of active infection at this time. No fevers, chills, nausea, vomiting, or diarrhea. 04/20/2021 upon evaluation today patient appears to be doing decently well in regard to her foot. There is some need for sharp debridement here today. With that being said we will get a go ahead and proceed with that today as the foot is becoming somewhat macerated with the overhanging callus that is definitely not we want to see. With that being said the patient does have an issue here as well with a boil in the perineal region unfortunately that is an issue for her today as well. I did have a look at that as well. We are still working on dealing with insurance as far as getting hyperbarics approved. 04/27/2021 upon evaluation today patient appears to be doing somewhat poorly in general compared to where she has been. At this point she is having a lot of swelling which is the main concerning thing that I am seeing. There does not appear to be any signs of infection currently which is good news but at the same time  I do feel like that she is a lot more swollen than she was even last week and is showing how much she is weeping in regard to the right lower leg. She also has a blister on the left breast although is not an open wound at this time. She continues to have the area in the perineum as well. 05/04/2021 upon evaluation today patient appears to actually be doing decently well in regard to her wound on the foot. Fortunately there is no signs of active infection at this time. No fevers, chills, nausea, vomiting, or diarrhea. Unfortunately she does have an area on the left breast which is actually appearing to be a burn based on what I see physically. She does note that she uses rice bags for areas in general and may have left one in that area not realizing it was burning her as she does not really have much  feeling. I think this is very probable based on what I am seeing. For that reason working to treat this as such I do think we need to loosen up a lot of the necrotic tissue here. I Am going tosend in a prescription for Santyl for the patient. 9/1; patient presents for HBO today but cannot do treatment due to wheezing and feeling short of breath when laying down. She was set up as a doctor visit today since she was not able to do HBO. She states she feels okay overall and started having a dry cough over the past couple days. She has not tested herself for COVID. She denies fever/chills or sputum production. She thinks her symptoms are related to allergies. She has been using silver alginate to her right plantar foot wound. She has not been using Santyl to the breast wound because she reports forgetting to do this. She uses zinc oxide to her perennial wound. She currently denies systemic signs of infection. 9/8; patient presents for follow-up. She has been a unable to do HBO treatments for the past week due to her back pain. She is currently taking Flexeril to address this issue. She reports no issues with her  compression wrap. She has been putting Santyl on the left breast wound and Monistat cream to the perennial wound. She denies signs of infection. 9/15; patient presents for follow-up. Again she is unable to do HBO treatment today due to sinus pain. She has 2 new wounds to her right foot which she is unaware of. She has been putting Santyl on the left breast wound and zinc oxide to the perineal wound. She currently denies signs of infection. Readmission 10/17/2021 Ms. Meyli Vicente is a 62 year old female with a past medical history of type 2 diabetes, osteomyelitis status post right BKA and morbid obesity that presents to the clinic for multiple scattered open wounds to her body. These are located to the abdomen, left posterior leg and sacral region. She has been using mupirocin ointment, Neosporin and zinc oxide to the wound beds. She has been started on Bactrim and Keflex by her primary care physician and states she has several days left to complete the course. She currently denies systemic signs of infection. READMISSION This is a 62 year old morbidly obese type II diabetic (poorly controlled with last hemoglobin A1c 9.2%). She has congestive heart failure, peripheral vascular disease, hypertension, coronary artery disease, venous stasis, connective tissue disease (o Sjogren's syndrome), and bilateral lower extremity edema. She has been seen here for various reasons in the past. She has a right above-knee amputation. She was recently hospitalized for a left diabetic foot ulcer. Orthopedic surgery was consulted. An MRI was performed that was not conclusive for osteomyelitis, but given the extent of the necrosis, orthopedics recommended amputation. Infectious disease was also consulted and have recommended a 6-week course of oral doxycycline and Augmentin. Apparently wound care was also consulted while she was in the hospital and simply recommended painting the area with Betadine. She saw infectious  disease on October 27 with plans to continue doxycycline on Augmentin to continue therapy until November 14. The infectious disease provider also recommended that the patient reconsider amputation. Her PCP referred her to the wound care center, as the patient is very reluctant to undergo amputation. She also has a small ulcer on her anterior tibial surface that recently opened after a minor trauma. On exam, her left foot is rolled such that the lateral aspect of her foot is the contact surface. There  is a large ulcer with exposed necrotic muscle and fat. The wound does not probe to bone, but apparently there was bone exposure in the past while she was in the hospital. No malodor or purulent drainage. The wound on her anterior tibial surface is small with just a little slough accumulation. She has modest edema and skin changes consistent with stasis dermatitis. 07/28/2022: The anterior tibial wound is healed. The left plantar foot wound looks a little bit better today. There is significantly less necrotic tissue present. There is an area at the most caudal aspect of the wound that looks like there is ongoing pressure-induced tissue injury. Bone remains covered. 08/11/2022: The wound has deteriorated quite a bit since her last visit. Her husband reports that she has had significantly more drainage, such that he has had to change the dressing multiple times per day. During the course of the visit, she recalled that she had not been taking her Lasix for at least 4 days. There is substantial periwound maceration and further tissue breakdown. There is necrotic tissue at the midportion of the wound and tendon is now exposed underneath this necrotic muscle. 08/17/2022: The wound looks better this week. There is still an area of necrotic muscle and tendon in the midfoot, but the forefoot has improved with some epithelium beginning to come in from the margins. Unfortunately, there is evidence of ongoing  pressure-induced tissue injury near the heel. The patient does admit to resting her heel on a stool frequently. She is still taking Bactrim as prescribed and her Keystone topical antibiotic compound should arrive at their home today. 12/14; fairly large sized wound on the left plantar foot. She has a right AKA. She uses a leg only for transfers she says she does not propel her wheelchair with that foot. She has been using Keystone, silver alginate and an Ace wrap. She has completed her Bactrim and Augmentin. She is a type II diabetic. The most worrisome part of this wound is a smaller area roughly 2 x 2 in the distal part of the wound which is 100% occupied by necrotic tendon 09/07/2022: She had an extensive debridement at her last visit. The wound is smaller today and all of the tissues appear healthier. 09/15/2022: The wound measurements are about the same, but overall the surface appears healthier. There is still a little bit of the nonviable tendon exposed at the midfoot. 10/13/2022: The patient has been ill and unable to attend clinic for almost a month. Today, the wound measures smaller and there are buds of epithelium beginning to emerge, particularly at the calcaneus. There is a bridge of skin trying to come across the midportion of the wound, as well. Minimal slough accumulation. 10/26/2022: The absolute wound measurements have not changed, but the surface is technically smaller due to a bridge of epithelium that nearly divides the wound in two. There is some eschar at the medial edge of the calcaneal aspect of the wound, underneath which there is good epithelium. There is some slough on the wound surface, predominantly at the heel. 11/14/2022: She has been absent from clinic for couple of weeks due to health issues. There is more epithelium encroaching upon the edges of the wound. She has built up some eschar around the edges. There is thin biofilm and slough on the wound surface. [name], [name]  ([id number]) [id number] Page 4 of 13 Electronic Signature(s) Signed: 11/14/2022 2:24:39 PM By: Fredirick Maudlin MD FACS Entered By: Fredirick Maudlin on 11/14/2022 14:24:39 -------------------------------------------------------------------------------- Physical Exam  Details Patient Name: Date of Service: [place]st, [name] [name] 11/14/2022 1:15 PM Medical Record Number: [id number] Patient Account Number: [id number] Date of Birth/Sex: Treating RN: 09/10/1961 (62 y.o. F) Primary Care Provider: [name] Other Clinician: Referring Provider: Treating Provider/Extender: Louann Sjogren in Treatment: 16 Constitutional Hypertensive, asymptomatic. . . . no acute distress. Notes 11/14/2022: There is more epithelium encroaching upon the edges of the wound. She has built up some eschar around the edges. There is thin biofilm and slough on the wound surface. Electronic Signature(s) Signed: 11/14/2022 2:28:18 PM By: Fredirick Maudlin MD FACS Entered By: Fredirick Maudlin on 11/14/2022 14:28:17 -------------------------------------------------------------------------------- Physician Orders Details Patient Name: Date of Service: Grecco, [name] [name] 11/14/2022 1:15 PM Medical Record Number: [id number] Patient Account Number: [id number] Date of Birth/Sex: Treating RN: 07/24/1961 (62 y.o. 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 - Dr. Celine Ahr - room 2 Return A Anesthetic (In clinic) Topical Lidocaine 5% applied to wound bed Bathing/ Shower/ Hygiene May shower and wash wound with soap and water. - with dressing changes Edema Control - Lymphedema / SCD / Other Elevate legs to the level of the heart or above for 30 minutes daily and/or when sitting for 3-4 times a day throughout the day. Avoid standing for long periods of time. Patient to wear own compression stockings every day. Off-Loading Other: - Try and elevate Left leg throughout the day Wound Treatment Wound #19 - Foot Wound Laterality: Plantar, Left Mandel, Rodolfo [name] ([id number]) [id number] Page 5 of 13 Cleanser: Soap and Water 1 x Per Day/30 Days Discharge Instructions: May shower and wash wound with dial antibacterial soap and water prior to dressing change. Cleanser: Wound Cleanser (Generic) 1 x Per Day/30 Days Discharge Instructions: Cleanse the wound with wound cleanser prior to applying a clean dressing using gauze sponges, not tissue or cotton balls. Prim Dressing: Maxorb Extra Ag+ Alginate Dressing, 4x4.75 (in/in) (DME) (Dispense As Written) 1 x Per Day/30 Days ary Discharge Instructions: Apply to wound bed as instructed Secondary Dressing: ABD Pad, 5x9 (DME) (Generic) 1 x Per Day/30 Days Discharge Instructions: Apply over primary dressing as directed. Secondary Dressing: Woven Gauze Sponge, Non-Sterile 4x4 in 1 x Per Day/30 Days Discharge Instructions: Apply over primary dressing as directed. Secured With: Elastic Bandage 4 inch (ACE bandage) (Generic) 1 x Per Day/30 Days Discharge Instructions: Secure with ACE bandage as directed. Secured With: [employer], 4.5x3.1 (in/yd) (DME) (Generic) 1 x Per Day/30 Days Discharge Instructions: Secure with Kerlix as directed. Secured  With: 47M Medipore Public affairs consultant Surgical T 2x10 (in/yd) (Generic) 1 x Per Day/30 Days ape Discharge Instructions: Secure with tape as directed. Electronic Signature(s) Signed: 11/15/2022 8:23:25 AM By: Fredirick Maudlin MD FACS Entered By: Fredirick Maudlin on 11/14/2022 14:28:35 -------------------------------------------------------------------------------- Problem List Details Patient Name: Date of Service: [name], [name] [name] 11/14/2022 1:15 PM Medical Record Number: [id number] Patient Account Number: [id number] Date of Birth/Sex: Treating RN: 1961/04/09 (62 y.o. 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 07/19/2022 No Yes M86.9 Osteomyelitis, unspecified 07/19/2022 No Yes E11.621 Type 2 diabetes mellitus with foot ulcer 07/19/2022 No Yes E66.01 Morbid (severe) obesity due to excess calories 07/19/2022 No Yes M35.00 Sjogren syndrome, unspecified 07/19/2022 No Yes I73.9 Peripheral vascular disease, unspecified 07/19/2022 No Yes [id number] Chronic diastolic (congestive) heart failure 07/19/2022 No Yes Erich, Brennyn [name] ([id number]) [id number] Page 6 of 13 [id number] Acquired absence of right leg above knee 07/19/2022 No Yes Inactive Problems Resolved Problems ICD-10 Code Description Active Date Resolved Date L97.822 Non-pressure chronic ulcer of other part of left lower leg with fat layer exposed 07/19/2022 07/19/2022 Electronic Signature(s) Signed: 11/14/2022 2:20:39 PM By: Fredirick Maudlin MD FACS Entered By: Fredirick Maudlin on 11/14/2022 14:20:39 -------------------------------------------------------------------------------- Progress Note Details Patient Name: Date of  Service: [name], [name] [name] 11/14/2022 1:15 PM Medical Record Number: [id number] Patient Account Number: [id number] Date of Birth/Sex: Treating RN: 10-22-1960 (62 y.o. F) Primary Care Provider: [name] Other Clinician: Referring Provider: Treating Provider/Extender: Louann Sjogren in Treatment: 16 Subjective Chief Complaint Information obtained from Patient 10/17/2021; multiple scattered wounds to the abdomen, posterior left leg, left labia and sacrum 07/19/2022: left foot DFU History of Present Illness (HPI) 07/23/2019 on evaluation today patient presents with a myriad of wounds noted at multiple locations over her right heel, right lower extremity, and abdominal region. She also has bilateral lower extremity lymphedema which is quite significant as well. Incidentally she also has congestive heart failure and hypertension. She also is obese. With that being said I think all this is contributing as well to her lower extremity edema which is very much uncontrolled. It seems like its been at least several months since she is worn any compression according to what she tells me. With that being said I am not sure exactly when that would have been. She does have fibrotic changes in the lower extremity secondary to lymphedema worse on the left than the right. She did show me pictures of wound she had on the anterior portion of her shin which was quite significant fortunately that has healed. These issues have been intermittent at this time. Again I think that with appropriate compression therapy she may actually be doing better than what we are seeing at this point but again I am not really sure that she is ever been extremely compliant with that. Fortunately there is no signs of active infection at this time. No fever chills noted. As far as the abdominal ulcer she initially had one wound that she thinks may have been a bug bite. Subsequently she was put a dressing on  this and she states that the tape pulled skin off on other locations causing other wounds that have not healed that is been about 3 months. The wounds on her lower extremities have been intermittent over 3 years. This includes the heel. 11/19; this is a patient that I have not seen previously. She was admitted to our clinic last week with multiple wounds including several superficial circular areas on her abdomen predominantly right upper quadrant. Also 1 in her umbilicus. She has an area on her right lateral malleolus which was new today. She has bilateral lower extremity edema with very significant stasis dermatitis on the left anterior tibial area. She has tightly adherent skin in her lower extremities probably  secondary to cutaneous fibrosis in the area. We put her in compression last week. She comes in with the dressings reasonably saturated. 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 She has multiple areas on her arms from scratching. 12/3; the patient I saw for the first time 2 weeks ago. She has 3 small circular areas on her abdomen 2 in the right upper quadrant one on her umbilicus. We have been using silver alginate to this area. She also had an area on her right lateral malleolus and right heel and right medial malleolus. We have been using silver alginate here under compression. She does not have an open area on the left leg today. We are going to order her stockings for the left leg. She clearly has chronic lymphedema in these areas. X-ray of the foot from 10/20 did not show any fracture or dislocation. The heel wound was noted there was no evidence of osteomyelitis She complains of generalized pruritus. 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 She has  longstanding lymphedema. Although she does not have known liver disease I looked at his CT scan of the abdomen from February of this year that showed hepatic steatosis 12/11; patient still has no open area on the left leg we transitioned her into a stocking she had. Her stockings from [employer] are supposed to be arriving later today which will be the stockings of choice. The only wound remaining on the right is the right heel 2 small superficial open areas. Everything else is well on its way to healing in the right leg few small excoriations. She has 1 major area remaining on her abdomen the rest seem to be healing 12/22 patient still has no open area on the left leg and she is still in a stocking. She had still 2 open areas on the tip of her right heel. These look like pressure related areas although the patient is not really certain how this is happening. She has an open heeled shoe that we provided. Still has 1 open area on the right lateral abdomen The patient has complaints of generalized pruritus. She has multiple excoriated areas on her abdomen that of close over her arms or thighs which I think are from scratching. She tells me that she has never had a basic work-up for this which might include basic lab work, liver functions, kidney functions, thyroid etc. She might also benefit from a dermatologist 12/29; the patient has a deeper larger more painful wound on the tip of her right heel. Again these look like pressure ulcers although she wears an open toed heel pads or heel at night to prevent it from hitting the mattress etc. They tell me and remind me that this is been present on and off for about 2 years that has closed over but then will reopen. I did look over her lab work that was available in [place]. She does not have elevated liver function tests or creatinine. I was not able to see a TSH. This was in response to her complaints of generalized itching and a itch/scratch cycle.  I also noted that she is on OxyContin and oxycodone and of course narcotics can cause generalized pruritus as a side effect Koudelka, Darcel [name] ([id number]) [id number] Page 7 of 13 Readmission: History From [place], [place] Last Week:  03/29/2021 this is a patient who presents for initial evaluation here in the clinic though I have seen her 1 time previously in 2020 this was in [place]. That was actually her initial visit therefore a heel ulceration. Subsequently that did not healing she actually saw me the 1 time in November and then subsequently saw Dr. Dellia Nims through the end of December where she apparently was healed. 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 This is on the right. With that being said right now she has a basically region on the lateral portion of the ray site where she is applying more pressure especially as her foot seems to be starting to turn in and this has become an increasingly significant issue for her to be honest. She does see Dr. Sharol Given and Dr. Sharol Given has had her on doxycycline for quite a bit of time here. Subsequently she is just now wrapping that out. I think that she probably would be benefited by continue the doxycycline for a time while we can work through what we need to do with things here. She does have evidence of osteomyelitis based on what I saw on the MRI today. This obviously is unfortunate and definitely not something that she was hoping to hear. With that being said I do believe that she would potentially be a strong candidate for hyperbaric oxygen therapy. Also believe she could be a candidate for total contact cast though we have to be cautious due to the fact that how her foot is bending inward I think that this is good to be a little bit of a concern. We will definitely have to keeping a close eye on things. She does have a  history of diabetes mellitus type 2. She also other than the amputation has a medical history positive for hypertension. She has never undergone hyperbaric oxygen therapy previously I did show her the chamber we did talk a little bit about it today as well. Admission [facility]: o 04/06/2021 Patient presents here in [place] today for evaluation. Again she is establishing care here after I saw her last week in [place]. Obviously I think that she is doing extremely well at this point which is great news and in general I am extremely pleased with where things stand from the standpoint of the appearance of the wound. Obviously this is not significantly smaller but does look a lot cleaner I think using the Hydrofera Blue has been of benefit over the past week. Nonetheless she still has a wound with issues with pressure here which I think is good to be an ongoing issue. Also think that the osteomyelitis which is chronic is also getting ongoing issue. She does want to try to do what she can to prevent this from worsening and ending with a below-knee amputation. T that end I do think that getting her into the hyperbaric oxygen chamber would be what we need to do. She has recently been seen by cardiology o and subsequently well as far as that is concerned. Her ejection fraction was appropriate for hyperbarics and everything seems to be doing great in that regard. She has not had a recent chest x-ray she is a former smoker around 15 years ago she quit. Nonetheless I do believe with her asthma we do want to do a chest x-ray just to make sure everything is okay before proceeding with  the hyperbarics although we can go ahead and see about getting the approval. I also think she is going require some sharp debridement and around this wound we also have to contact her insurance for prior approval on this as well. 04/13/2021 upon evaluation today patient appears to be doing a little bit better in regard  to her leg in fact the swelling is dramatically better. Very pleased with where things stand in that regard. Fortunately there does not appear to be any signs of active infection at this time. No fevers, chills, nausea, vomiting, or diarrhea. 04/20/2021 upon evaluation today patient appears to be doing decently well in regard to her foot. There is some need for sharp debridement here today. With that being said we will get a go ahead and proceed with that today as the foot is becoming somewhat macerated with the overhanging callus that is definitely not we want to see. With that being said the patient does have an issue here as well with a boil in the perineal region unfortunately that is an issue for her today as well. I did have a look at that as well. We are still working on dealing with insurance as far as getting hyperbarics approved. 04/27/2021 upon evaluation today patient appears to be doing somewhat poorly in general compared to where she has been. At this point she is having a lot of swelling which is the main concerning thing that I am seeing. There does not appear to be any signs of infection currently which is good news but at the same time I do feel like that she is a lot more swollen than she was even last week and is showing how much she is weeping in regard to the right lower leg. She also has a blister on the left breast although is not an open wound at this time. She continues to have the area in the perineum as well. 05/04/2021 upon evaluation today patient appears to actually be doing decently well in regard to her wound on the foot. Fortunately there is no signs of active infection at this time. No fevers, chills, nausea, vomiting, or diarrhea. Unfortunately she does have an area on the left breast which is actually appearing to be a burn based on what I see physically. She does note that she uses rice bags for areas in general and may have left one in that area not realizing it  was burning her as she does not really have much feeling. I think this is very probable based on what I am seeing. For that reason working to treat this as such I do think we need to loosen up a lot of the necrotic tissue here. I Am going tosend in a prescription for Santyl for the patient. 9/1; patient presents for HBO today but cannot do treatment due to wheezing and feeling short of breath when laying down. She was set up as a doctor visit today since she was not able to do HBO. She states she feels okay overall and started having a dry cough over the past couple days. She has not tested herself for COVID. She denies fever/chills or sputum production. She thinks her symptoms are related to allergies. She has been using silver alginate to her right plantar foot wound. She has not been using Santyl to the breast wound because she reports forgetting to do this. She uses zinc oxide to her perennial wound. She currently denies systemic signs of infection. 9/8; patient presents for follow-up.  She has been a unable to do HBO treatments for the past week due to her back pain. She is currently taking Flexeril to address this issue. She reports no issues with her compression wrap. She has been putting Santyl on the left breast wound and Monistat cream to the perennial wound. She denies signs of infection. 9/15; patient presents for follow-up. Again she is unable to do HBO treatment today due to sinus pain. She has 2 new wounds to her right foot which she is unaware of. She has been putting Santyl on the left breast wound and zinc oxide to the perineal wound. She currently denies signs of infection. Readmission 10/17/2021 Ms. Markya Rossen is a 62 year old female with a past medical history of type 2 diabetes, osteomyelitis status post right BKA and morbid obesity that presents to the clinic for multiple scattered open wounds to her body. These are located to the abdomen, left posterior leg and sacral region. She  has been using mupirocin ointment, Neosporin and zinc oxide to the wound beds. She has been started on Bactrim and Keflex by her primary care physician and states she has several days left to complete the course. She currently denies systemic signs of infection. READMISSION This is a 62 year old morbidly obese type II diabetic (poorly controlled with last hemoglobin A1c 9.2%). She has congestive heart failure, peripheral vascular disease, hypertension, coronary artery disease, venous stasis, connective tissue disease (o Sjogren's syndrome), and bilateral lower extremity edema. She has been seen here for various reasons in the past. She has a right above-knee amputation. She was recently hospitalized for a left diabetic foot ulcer. Orthopedic surgery was consulted. An MRI was performed that was not conclusive for osteomyelitis, but given the extent of the necrosis, orthopedics recommended amputation. Infectious disease was also consulted and have recommended a 6-week course of oral doxycycline and Augmentin. Apparently wound care was also consulted while she was in the hospital and simply recommended painting the area with Betadine. She saw infectious disease on October 27 with plans to continue doxycycline on Augmentin to continue therapy until November 14. The infectious disease provider also recommended that the patient reconsider amputation. Her PCP referred her to the wound care center, as the patient is very reluctant to undergo amputation. She also has a small ulcer on her anterior tibial surface that recently opened after a minor trauma. On exam, her left foot is rolled such that the lateral aspect of her foot is the contact surface. There is a large ulcer with exposed necrotic muscle and fat. The wound does not probe to bone, but apparently there was bone exposure in the past while she was in the hospital. No malodor or purulent drainage. The wound on her anterior tibial surface is small with  just a little slough accumulation. She has modest edema and skin changes consistent with stasis dermatitis. 07/28/2022: The anterior tibial wound is healed. The left plantar foot wound looks a little bit better today. There is significantly less necrotic tissue present. There is an area at the most caudal aspect of the wound that looks like there is ongoing pressure-induced tissue injury. Bone remains covered. 08/11/2022: The wound has deteriorated quite a bit since her last visit. Her husband reports that she has had significantly more drainage, such that he has had to change the dressing multiple times per day. During the course of the visit, she recalled that she had not been taking her Lasix for at least 4 days. There is substantial periwound maceration and  further tissue breakdown. There is necrotic tissue at the midportion of the wound and tendon is now exposed underneath this necrotic muscle. Posada, Nickol [name] ([id number]) [id number] Page 8 of 13 08/17/2022: The wound looks better this week. There is still an area of necrotic muscle and tendon in the midfoot, but the forefoot has improved with some epithelium beginning to come in from the margins. Unfortunately, there is evidence of ongoing pressure-induced tissue injury near the heel. The patient does admit to resting her heel on a stool frequently. She is still taking Bactrim as prescribed and her Keystone topical antibiotic compound should arrive at their home today. 12/14; fairly large sized wound on the left plantar foot. She has a right AKA. She uses a leg only for transfers she says she does not propel her wheelchair with that foot. She has been using Keystone, silver alginate and an Ace wrap. She has completed her Bactrim and Augmentin. She is a type II diabetic. The most worrisome part of this wound is a smaller area roughly 2 x 2 in the distal part of the wound which is 100% occupied by necrotic  tendon 09/07/2022: She had an extensive debridement at her last visit. The wound is smaller today and all of the tissues appear healthier. 09/15/2022: The wound measurements are about the same, but overall the surface appears healthier. There is still a little bit of the nonviable tendon exposed at the midfoot. 10/13/2022: The patient has been ill and unable to attend clinic for almost a month. Today, the wound measures smaller and there are buds of epithelium beginning to emerge, particularly at the calcaneus. There is a bridge of skin trying to come across the midportion of the wound, as well. Minimal slough accumulation. 10/26/2022: The absolute wound measurements have not changed, but the surface is technically smaller due to a bridge of epithelium that nearly divides the wound in two. There is some eschar at the medial edge of the calcaneal aspect of the wound, underneath which there is good epithelium. There is some slough on the wound surface, predominantly at the heel. 11/14/2022: She has been absent from clinic for couple of weeks due to health issues. There is more epithelium encroaching upon the edges of the wound. She has built up some eschar around the edges. There is thin biofilm and slough on the wound surface. Patient History Information obtained from Patient. Family History Diabetes - Mother, Heart Disease - Mother,Father,Siblings, Hypertension - Mother,Father,Siblings, Lung Disease - Father, Stroke - Mother, No family history of Cancer, Hereditary Spherocytosis, Kidney Disease, Seizures, Thyroid Problems, Tuberculosis. Social History Former smoker - quit 15 years ago, Marital Status - Married, Alcohol Use - Never, Drug Use - No History, Caffeine Use - Rarely. Medical History Eyes Patient has history of Cataracts - both eyes Denies history of Glaucoma, Optic Neuritis Ear/Nose/Mouth/Throat Denies history of Chronic sinus problems/congestion, Middle ear  problems Hematologic/Lymphatic Patient has history of Lymphedema Denies history of Anemia, Hemophilia, Human Immunodeficiency Virus, Sickle Cell Disease Respiratory Patient has history of Asthma Denies history of Aspiration, Chronic Obstructive Pulmonary Disease (COPD), Pneumothorax, Sleep Apnea, Tuberculosis Cardiovascular Patient has history of Congestive Heart Failure, Coronary Artery Disease, Hypertension, Peripheral Arterial Disease, Peripheral Venous Disease Denies history of Angina, Arrhythmia, Hypotension, Myocardial Infarction, Phlebitis, Vasculitis Gastrointestinal Denies history of Hepatitis A, Hepatitis B, Hepatitis C Endocrine Patient has history of Type II Diabetes Denies history of Type I Diabetes Genitourinary Denies history of End Stage Renal Disease Immunological Denies history of Lupus Erythematosus, Raynaudoos, Scleroderma  Integumentary (Skin) Denies history of History of Burn Musculoskeletal Patient has history of Rheumatoid Arthritis Denies history of Gout, Osteoarthritis, Osteomyelitis Neurologic Patient has history of Neuropathy Denies history of Dementia, Quadriplegia, Paraplegia, Seizure Disorder Oncologic Denies history of Received Chemotherapy, Received Radiation Psychiatric Denies history of Anorexia/bulimia, Confinement Anxiety Hospitalization/Surgery History - 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 Sharol Given 01/2020. 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 - 11/22 R AKA. Medical A Surgical History Notes nd Constitutional Symptoms (General Health) hypothyroidism Cardiovascular cardiac stents Gastrointestinal GERD Endocrine Hypothyroidism Immunological Mix connective tissue disease- autoimmune Sjogren's syndrome Musculoskeletal Fibromyalgia, Scoliosis, Corsetti, Elyza [name] ([id number]) [id number] Page 9 of 13 Objective Constitutional Hypertensive, asymptomatic. no acute distress. Vitals Time Taken: 1:34 PM,  Height: 62 in, Source: Stated, Weight: 284 lbs, Source: Stated, BMI: 51.9, Temperature: 98 F, Pulse: 89 bpm, Respiratory Rate: 20 breaths/min, Blood Pressure: 155/80 mmHg, Capillary Blood Glucose: 178 mg/dl. General Notes: glucose per pt report this am General Notes: 11/14/2022: There is more epithelium encroaching upon the edges of the wound. She has built up some eschar around the edges. There is thin biofilm and slough on the wound surface. Integumentary (Hair, Skin) Wound #19 status is Open. Original cause of wound was Gradually Appeared. The date acquired was: 05/17/2022. The wound has been in treatment 16 weeks. The wound is located on the [place]. The wound measures 9.9cm length x 3.5cm width x 0.4cm depth; 27.214cm^2 area and 10.886cm^3 volume. There is Fat Layer (Subcutaneous Tissue) exposed. There is no tunneling or undermining noted. There is a medium amount of serosanguineous drainage noted. The wound margin is distinct with the outline attached to the wound base. There is large (67-100%) pink granulation within the wound bed. There is a small (1- 33%) amount of necrotic tissue within the wound bed including Eschar and Adherent Slough. The periwound skin appearance had no abnormalities noted for moisture. The periwound skin appearance exhibited: Callus. The periwound skin appearance did not exhibit: Crepitus, Excoriation, Induration, Rash, Scarring, Atrophie Blanche, Cyanosis, Ecchymosis, Hemosiderin Staining, Mottled, Pallor, Rubor, Erythema. Periwound temperature was noted as No Abnormality. 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 There was a Selective/Open Wound Skin/Epidermis Debridement with a total area of 34.65 sq cm performed by Fredirick Maudlin, MD. With the following instrument(s): Curette to remove Non-Viable tissue/material. Material removed includes Eschar, Callus, Slough, Skin: Epidermis, and Biofilm after achieving pain control using Lidocaine 4% Topical Solution. No specimens were taken. A time out was conducted at 13:55, prior to the start of the procedure. A Minimum amount of bleeding was controlled with Pressure. The procedure was tolerated well with a pain level of 0 throughout and a pain level of 0 following the procedure. Post Debridement Measurements: 9.9cm length x 3.5cm width x 0.4cm depth; 10.886cm^3 volume. Character of Wound/Ulcer Post Debridement is improved. Severity of Tissue Post Debridement is: Fat layer exposed. Post procedure Diagnosis Wound #19: Same as Pre-Procedure General Notes: Scribed for Dr. Celine Ahr by Baruch Gouty, RN. Plan Follow-up Appointments: Return Appointment in 2 weeks. - Dr. Celine Ahr - room 2  Anesthetic: (In clinic) Topical Lidocaine 5% applied to wound bed Bathing/ Shower/ Hygiene: May shower and wash wound with soap and water. - with dressing changes Edema Control - Lymphedema / SCD / Other: Elevate legs to the level of the heart or above for 30 minutes daily and/or when sitting for 3-4 times a day throughout the day. Avoid standing for long periods of time. Patient to wear own compression stockings every day. Off-Loading: Other: - Try and elevate Left leg throughout the day WOUND #19: - Foot Wound Laterality: Plantar, Left Cleanser: Soap and Water 1 x Per Day/30 Days Discharge Instructions: May shower and wash wound with dial antibacterial soap and water prior to  dressing change. Cleanser: Wound Cleanser (Generic) 1 x Per Day/30 Days [name], [name] ([id number]) [id number] Page 10 of 13 Discharge Instructions: Cleanse the wound with wound cleanser prior to applying a clean dressing using gauze sponges, not tissue or cotton balls. Prim Dressing: Maxorb Extra Ag+ Alginate Dressing, 4x4.75 (in/in) (DME) (Dispense As Written) 1 x Per Day/30 Days ary Discharge Instructions: Apply to wound bed as instructed Secondary Dressing: ABD Pad, 5x9 (DME) (Generic) 1 x Per Day/30 Days Discharge Instructions: Apply over primary dressing as directed. Secondary Dressing: Woven Gauze Sponge, Non-Sterile 4x4 in 1 x Per Day/30 Days Discharge Instructions: Apply over primary dressing as directed. Secured With: Elastic Bandage 4 inch (ACE bandage) (Generic) 1 x Per Day/30 Days Discharge Instructions: Secure with ACE bandage as directed. Secured With: [employer], 4.5x3.1 (in/yd) (DME) (Generic) 1 x Per Day/30 Days Discharge Instructions: Secure with Kerlix as directed. Secured With: 107M Medipore Public affairs consultant Surgical T 2x10 (in/yd) (Generic) 1 x Per Day/30 Days ape Discharge Instructions: Secure with tape as directed. 11/14/2022: There is more epithelium encroaching upon the edges of the wound. She has built up some eschar around the edges. There is thin biofilm and slough on the wound surface. I used a curette to debride eschar, slough, biofilm, and callus from the wound. We will continue to use [facility] topical antibiotic compound with silver alginate, Kerlix and Ace bandaging. Follow-up in 2 weeks. Electronic Signature(s) Signed: 11/14/2022 2:29:42 PM By: Fredirick Maudlin MD FACS Entered By: Fredirick Maudlin on 11/14/2022 14:29:42 -------------------------------------------------------------------------------- HxROS Details Patient Name: Date of Service: [name], [name] [name] 11/14/2022 1:15 PM Medical Record Number: [id number] Patient Account  Number: [id number] Date of Birth/Sex: Treating RN: 07-12-1961 (62 y.o. F) Primary Care Provider: [name] Other Clinician: Referring Provider: Treating Provider/Extender: Louann Sjogren in Treatment: 16 Information Obtained From Patient Constitutional Symptoms (General Health) Medical History: Past Medical History Notes: hypothyroidism Eyes Medical History: Positive for: Cataracts - both eyes Negative for: Glaucoma; Optic Neuritis Ear/Nose/Mouth/Throat Medical History: Negative for: Chronic sinus problems/congestion; Middle ear problems Hematologic/Lymphatic Medical History: Positive for: Lymphedema Negative for: Anemia; Hemophilia; Human Immunodeficiency Virus; Sickle Cell Disease Respiratory Medical History: Positive for: Asthma Negative for: Aspiration; Chronic Obstructive Pulmonary Disease (COPD); Pneumothorax; Sleep Apnea; Tuberculosis Cardiovascular Medical History: Positive for: Congestive Heart Failure; Coronary Artery Disease; Hypertension; Peripheral Arterial Disease; Peripheral Venous Disease Negative for: Angina; Arrhythmia; Hypotension; Myocardial Infarction; Phlebitis; Vasculitis Past Medical History Notes: cardiac stents [name], [name] ([id number]) [id number] Page 11 of 13 Gastrointestinal Medical History: Negative for: Hepatitis A; Hepatitis B; Hepatitis C Past Medical History Notes: GERD Endocrine Medical History: Positive for: Type II Diabetes Negative for: Type I Diabetes Past Medical History Notes: Hypothyroidism Time with diabetes: since 2002 Treated with: Insulin, Oral agents Blood sugar tested every day: Yes Tested : 2-3 times  a day Genitourinary Medical History: Negative for: End Stage Renal Disease Immunological Medical History: Negative for: Lupus Erythematosus; Raynauds; Scleroderma Past Medical History Notes: Mix connective tissue disease- autoimmune Sjogren's  syndrome Integumentary (Skin) Medical History: Negative for: History of Burn Musculoskeletal Medical History: Positive for: Rheumatoid Arthritis Negative for: Gout; Osteoarthritis; Osteomyelitis Past Medical History Notes: Fibromyalgia, Scoliosis, Neurologic Medical History: Positive for: Neuropathy Negative for: Dementia; Quadriplegia; Paraplegia; Seizure Disorder Oncologic Medical History: Negative for: Received Chemotherapy; Received Radiation Psychiatric Medical History: Negative for: Anorexia/bulimia; Confinement Anxiety HBO Extended History Items Eyes: Cataracts Immunizations Pneumococcal Vaccine: Received Pneumococcal Vaccination: Yes Received Pneumococcal Vaccination On or After 60th Birthday: Yes Implantable Devices None Hospitalization / Surgery History Type of Hospitalization/Surgery 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 [name], [name] ([id number]) [id number] Page 12 of 13 11/22 R AKA Family and Social History Cancer: No; Diabetes: Yes - Mother; Heart Disease: Yes - Mother,Father,Siblings; Hereditary Spherocytosis: No; Hypertension: Yes - Mother,Father,Siblings; Kidney Disease: No; Lung Disease: Yes - Father; Seizures: No; Stroke: Yes - Mother; Thyroid Problems: No; Tuberculosis: No; Former smoker - quit 15 years ago; Marital Status - Married; Alcohol Use: Never; Drug Use: No History; Caffeine Use: Rarely; Financial Concerns: No; Food, Clothing or Shelter Needs: No; Support System Lacking: No; Transportation Concerns: No Electronic Signature(s) Signed: 11/15/2022 8:23:25 AM By: Fredirick Maudlin MD FACS Entered By: Fredirick Maudlin on 11/14/2022 14:24:58 -------------------------------------------------------------------------------- SuperBill Details Patient Name: Date of Service: [name], [name] [name] 11/14/2022 Medical Record Number: [id number] Patient Account Number:  [id number] Date of Birth/Sex: Treating RN: [birth year] (62 y.o. 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 M86.9 Osteomyelitis, unspecified E11.621 Type 2 diabetes mellitus with foot ulcer E66.01 Morbid (severe) obesity due to excess calories M35.00 Sjogren syndrome, unspecified I73.9 Peripheral vascular disease, unspecified [id number] Chronic diastolic (congestive) heart failure 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 Facility Procedures : 7 CPT4 Code: [id number] Description: [id number] - DEBRIDE WOUND 1ST 20 SQ CM OR < ICD-10 Diagnosis Description 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 Modifier: Quantity: 1 : 7 CPT4 Code: [id number] Description: [id number] - DEBRIDE WOUND EA ADDL 20 SQ CM ICD-10 Diagnosis Description 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 Modifier: Quantity: 1 Physician Procedures : CPT4 Code Description Modifier [id number] - WC PHYS LEVEL 4 - EST PT 25 ICD-10 Diagnosis Description 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 M86.9 Osteomyelitis, unspecified E11.621 Type 2 diabetes  mellitus with foot ulcer [id number] Chronic diastolic (congestive) heart failure Quantity: 1 : [id number] - WC PHYS DEBR WO ANESTH 20 SQ CM ICD-10 Diagnosis Description 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

## 2022-11-16 NOTE — Progress Notes (Signed)
Karel, Lovelle M ([id number]) [id number] Page 1 of 7 Visit Report for 11/14/2022 Arrival Information Details Patient Name: Date of Service: [place], [name] 11/14/2022 1:15 PM Medical Record Number: [id number] Patient Account Number: [id number] Date of Birth/Sex: Treating RN: Sep 20, 1960 (62 y.o. [name] Primary Care Pio Eatherly: [name] Other Clinician: Referring Vaughn Frieze: Treating Blythe Veach/Extender: Louann Sjogren in Treatment: 80 Visit Information History Since Last Visit Added or deleted any medications: No Patient Arrived: Wheel Chair Any new allergies or adverse reactions: No Arrival Time: 13:27 Had a fall or experienced change in No Accompanied By: spouse activities of daily living that may affect Transfer Assistance: None risk of falls: Patient Identification Verified: Yes Signs or symptoms of abuse/neglect since last visito No Secondary Verification Process Completed: Yes Hospitalized since last visit: No Patient Requires Transmission-Based Precautions: No Implantable device outside of the clinic excluding No Patient Has Alerts: No cellular tissue based products placed in the center since last visit: Has Dressing in Place as Prescribed: Yes Pain Present Now: Yes Electronic Signature(s) Signed: 11/14/2022 5:11:49 PM By: Baruch Gouty RN, BSN Entered By: Baruch Gouty on 11/14/2022 13:33:51 -------------------------------------------------------------------------------- Encounter Discharge Information Details Patient Name: Date of Service: Kreps, [name] 11/14/2022 1:15 PM Medical Record Number: [id number] Patient Account Number: [id number] Date of Birth/Sex: Treating RN: November 23, 1960 (62 y.o. [name] Primary Care Temprance Wyre: [name] Other Clinician: Referring Belicia Difatta: Treating Zunairah Devers/Extender: Louann Sjogren in Treatment: 16 Encounter Discharge  Information Items Post Procedure Vitals Discharge Condition: Stable Temperature (F): 98 Ambulatory Status: Wheelchair Pulse (bpm): 89 Discharge Destination: Home Respiratory Rate (breaths/min): 20 Transportation: Private Auto Blood Pressure (mmHg): 155/80 Accompanied By: spouse Schedule Follow-up Appointment: Yes Clinical Summary of Care: Patient Declined Electronic Signature(s) Signed: 11/14/2022 5:11:49 PM By: Baruch Gouty RN, BSN Entered By: Baruch Gouty on 11/14/2022 15:14:44 -------------------------------------------------------------------------------- Lower Extremity Assessment Details Patient Name: Date of Service: Suliman, [name] 11/14/2022 1:15 PM Medical Record Number: [id number] Patient Account Number: [id number] Date of Birth/Sex: Treating RN: 1961-05-25 (62 y.o. [name] Primary Care Ebba Goll: [name] Other Clinician: Referring Catalaya Garr: Treating Najae Rathert/Extender: Louann Sjogren in Treatment: 16 Edema Assessment Assessed: [name]: No] [Right: No] R[LeftSAKOYA, HINKEL [phone] [Right: [id number] Page 2 of 7] Edema: [Left: Ye] [Right: s] Calf Left: Right: Point of Measurement: 30 cm From Medial Instep 45 cm Ankle Left: Right: Point of Measurement: 9 cm From Medial Instep 26 cm Vascular Assessment Pulses: Dorsalis Pedis Palpable: [Left:Yes] Electronic Signature(s) Signed: 11/14/2022 5:11:49 PM By: Baruch Gouty RN, BSN Entered By: Baruch Gouty on 11/14/2022 13:40:10 -------------------------------------------------------------------------------- Multi Wound Chart Details Patient Name: Date of Service: Lamison, [name] 11/14/2022 1:15 PM Medical Record Number: [id number] Patient Account Number: [id number] Date of Birth/Sex: Treating RN: Dec 30, 1960 (62 y.o. F) Primary Care Juliya Magill: [name] Other Clinician: Referring Averee Harb: Treating Doralyn Kirkes/Extender:  Louann Sjogren in Treatment: 16 Vital Signs Height(in): 62 Capillary Blood Glucose(mg/dl): 178 Weight(lbs): 284 Pulse(bpm): 97 Body Mass Index(BMI): 51.9 Blood Pressure(mmHg): 155/80 Temperature(F): 98 Respiratory Rate(breaths/min): 20 [19:Photos:] [N/A:N/A] Left, Plantar Foot N/A N/A Wound Location: Gradually Appeared N/A N/A Wounding Event: Diabetic Wound/Ulcer of the Lower N/A N/A Primary Etiology: Extremity Cataracts, Lymphedema, Asthma, N/A N/A Comorbid History: Congestive Heart Failure, Coronary Artery Disease, Hypertension, Peripheral Arterial Disease, Peripheral Venous Disease, Type II Diabetes, Rheumatoid Arthritis, Neuropathy 05/17/2022 N/A N/A Date Acquired: 16 N/A N/A Weeks of Treatment: Open N/A N/A Wound Status: No N/A N/A Wound Recurrence: 9.9x3.5x0.4 N/A N/A Measurements L  x W x D (cm) 27.214 N/A N/A A (cm) : rea 10.886 N/A N/A Volume (cm) : -21.20% N/A N/A % Reduction in A rea: -21.20% N/A N/A % Reduction in Volume: Grade 2 N/A N/A Classification: Medium N/A N/A Exudate A mount: Serosanguineous N/A N/A Exudate Type: red, brown N/A N/A Exudate Color: Distinct, outline attached N/A N/A Wound Margin: Large (67-100%) N/A N/A Granulation A mount: Cozby, Kimbery M ([id number]) [id number] Page 3 of 7 Pink N/A N/A Granulation Quality: Small (1-33%) N/A N/A Necrotic Amount: Eschar, Adherent Slough N/A N/A Necrotic Tissue: Fat Layer (Subcutaneous Tissue): Yes N/A N/A Exposed Structures: Fascia: No Tendon: No Muscle: No Joint: No Bone: No Small (1-33%) N/A N/A Epithelialization: Debridement - Selective/Open Wound N/A N/A Debridement: Pre-procedure Verification/Time Out 13:55 N/A N/A Taken: Lidocaine 4% Topical Solution N/A N/A Pain Control: Necrotic/Eschar, Callus, Slough N/A N/A Tissue Debrided: Skin/Epidermis N/A N/A Level: 34.65 N/A N/A Debridement A (sq  cm): rea Curette N/A N/A Instrument: Minimum N/A N/A Bleeding: Pressure N/A N/A Hemostasis A chieved: 0 N/A N/A Procedural Pain: 0 N/A N/A Post Procedural Pain: Procedure was tolerated well N/A N/A Debridement Treatment Response: 9.9x3.5x0.4 N/A N/A Post Debridement Measurements L x W x D (cm) 10.886 N/A N/A Post Debridement Volume: (cm) Callus: Yes N/A N/A Periwound Skin Texture: Excoriation: No Induration: No Crepitus: No Rash: No Scarring: No Dry/Scaly: Yes N/A N/A Periwound Skin Moisture: Maceration: No Atrophie Blanche: No N/A N/A Periwound Skin Color: Cyanosis: No Ecchymosis: No Erythema: No Hemosiderin Staining: No Mottled: No Pallor: No Rubor: No No Abnormality N/A N/A Temperature: Debridement N/A N/A Procedures Performed: Treatment Notes Electronic Signature(s) Signed: 11/14/2022 2:22:34 PM By: Fredirick Maudlin MD FACS Entered By: Fredirick Maudlin on 11/14/2022 14:22:34 -------------------------------------------------------------------------------- Multi-Disciplinary Care Plan Details Patient Name: Date of Service: Hentges, [name] 11/14/2022 1:15 PM Medical Record Number: [id number] Patient Account Number: [id number] Date of Birth/Sex: Treating RN: May 30, 1961 (62 y.o. [name] Primary Care Lebert Lovern: [name] Other Clinician: Referring Shyloh Krinke: Treating Dakayla Disanti/Extender: Louann Sjogren in Treatment: [place] reviewed with physician Active Inactive Venous Leg Ulcer Nursing Diagnoses: Potential for venous Insuffiency (use before diagnosis confirmed) Goals: Patient will maintain optimal edema control Date Initiated: 07/19/2022 Target Resolution Date: 12/12/2022 Goal Status: Active Interventions: Assess peripheral edema status every visit. Renegar, Emmanuel M ([id number]) [id number] Page 4 of 7 Treatment Activities: Therapeutic compression applied :  07/19/2022 Notes: Wound/Skin Impairment Nursing Diagnoses: Impaired tissue integrity Knowledge deficit related to ulceration/compromised skin integrity Goals: Patient/caregiver will verbalize understanding of skin care regimen Date Initiated: 11/14/2022 Target Resolution Date: 12/12/2022 Goal Status: Active Interventions: Assess patient/caregiver ability to obtain necessary supplies Assess patient/caregiver ability to perform ulcer/skin care regimen upon admission and as needed Assess ulceration(s) every visit Treatment Activities: Skin care regimen initiated : 11/14/2022 Topical wound management initiated : 11/14/2022 Notes: Electronic Signature(s) Signed: 11/14/2022 5:11:49 PM By: Baruch Gouty RN, BSN Entered By: Baruch Gouty on 11/14/2022 13:42:36 -------------------------------------------------------------------------------- Pain Assessment Details Patient Name: Date of Service: Ulysse, [name] 11/14/2022 1:15 PM Medical Record Number: [id number] Patient Account Number: [id number] Date of Birth/Sex: Treating RN: [birth year] (62 y.o. [name] Primary Care Kobi Aller: [name] Other Clinician: Referring Nester Bachus: Treating Burdette Forehand/Extender: Louann Sjogren in Treatment: 16 Active Problems Location of Pain Severity and Description of Pain Patient Has Paino Yes Site Locations Pain Location: Pain in Ulcers With Dressing Change: No Duration of the Pain. Constant / Intermittento Constant Rate the pain. Current Pain Level: 7 Worst Pain Level:  9 Least Pain Level: 4 Character of Pain Describe the Pain: Aching Pain Management and Medication Current Pain Management: Medication: Yes Is the Current Pain Management Adequate: Adequate Rest: Yes Neria, Georjean M ([id number]) [id number] Page 5 of 7 How does your wound impact your activities of daily livingo Sleep: Yes Bathing: No Appetite: No Relationship With  Others: No Bladder Continence: No Emotions: Yes Bowel Continence: No Hobbies: No Toileting: No Dressing: No Notes reports chronic back pain Electronic Signature(s) Signed: 11/14/2022 5:11:49 PM By: Baruch Gouty RN, BSN Entered By: Baruch Gouty on 11/14/2022 13:36:43 -------------------------------------------------------------------------------- Patient/Caregiver Education Details Patient Name: Date of Service: Gibb, [name] 3/5/2024andnbsp1:15 PM Medical Record Number: [id number] Patient Account Number: [id number] Date of Birth/Gender: Treating RN: 02-01-1961 (62 y.o. [name] Primary Care Physician: [name] Other Clinician: Referring Physician: Treating Physician/Extender: Louann Sjogren in Treatment: 16 Education Assessment Education Provided To: Patient Education Topics Provided Offloading: Methods: Explain/Verbal Responses: Reinforcements needed, State content correctly Wound/Skin Impairment: Methods: Explain/Verbal Responses: Reinforcements needed, State content correctly Electronic Signature(s) Signed: 11/14/2022 5:11:49 PM By: Baruch Gouty RN, BSN Entered By: Baruch Gouty on 11/14/2022 13:42:58 -------------------------------------------------------------------------------- Wound Assessment Details Patient Name: Date of Service: House[place], [name] 11/14/2022 1:15 PM Medical Record Number: [id number] Patient Account Number: [id number] Date of Birth/Sex: Treating RN: September 07, 1961 (62 y.o. [name] Primary Care Courtenay Creger: [name] Other Clinician: Referring Timya Trimmer: Treating Morgen Ritacco/Extender: Louann Sjogren in Treatment: 16 Wound Status Wound Number: 19 Primary Diabetic Wound/Ulcer of the Lower Extremity Etiology: Wound Location: Left, Plantar Foot Wound Open Wounding Event: Gradually Appeared Status: Date Acquired: 05/17/2022 Comorbid Cataracts, Lymphedema,  Asthma, Congestive Heart Failure, Weeks Of Treatment: 16 History: Coronary Artery Disease, Hypertension, Peripheral Arterial Disease, Clustered Wound: No Peripheral Venous Disease, Type II Diabetes, Rheumatoid Arthritis, Neuropathy Photos Loeffelholz, Tiwanna M ([id number]) [id number] Page 6 of 7 Wound Measurements Length: (cm) 9.9 Width: (cm) 3.5 Depth: (cm) 0.4 Area: (cm) 27.214 Volume: (cm) 10.886 % Reduction in Area: -21.2% % Reduction in Volume: -21.2% Epithelialization: Small (1-33%) Tunneling: No Undermining: No Wound Description Classification: Grade 2 Wound Margin: Distinct, outline attached Exudate Amount: Medium Exudate Type: Serosanguineous Exudate Color: red, brown Foul Odor After Cleansing: No Slough/Fibrino Yes Wound Bed Granulation Amount: Large (67-100%) Exposed Structure Granulation Quality: Pink Fascia Exposed: No Necrotic Amount: Small (1-33%) Fat Layer (Subcutaneous Tissue) Exposed: Yes Necrotic Quality: Eschar, Adherent Slough Tendon Exposed: No Muscle Exposed: No Joint Exposed: No Bone Exposed: No Periwound Skin Texture Texture Color No Abnormalities Noted: No No Abnormalities Noted: No Callus: Yes Atrophie Blanche: No Crepitus: No Cyanosis: No Excoriation: No Ecchymosis: No Induration: No Erythema: No Rash: No Hemosiderin Staining: No Scarring: No Mottled: No Pallor: No Moisture Rubor: No No Abnormalities Noted: Yes Temperature / Pain Temperature: No Abnormality Treatment Notes Wound #19 (Foot) Wound Laterality: Plantar, Left Cleanser Soap and Water Discharge Instruction: May shower and wash wound with dial antibacterial soap and water prior to dressing change. Wound Cleanser Discharge Instruction: Cleanse the wound with wound cleanser prior to applying a clean dressing using gauze sponges, not tissue or cotton balls. Peri-Wound Care Topical Primary Dressing Maxorb Extra Ag+ Alginate Dressing, 4x4.75  (in/in) Discharge Instruction: Apply to wound bed as instructed Secondary Dressing ABD Pad, 5x9 Discharge Instruction: Apply over primary dressing as directed. Woven Gauze Sponge, Non-Sterile 4x4 in Discharge Instruction: Apply over primary dressing as directed. Meuth, Bassheva M ([id number]) [id number] Page 7 of 7 Secured With Elastic Bandage 4 inch (ACE bandage) Discharge Instruction: Secure with ACE  bandage as directed. Kerlix Roll Sterile, 4.5x3.1 (in/yd) Discharge Instruction: Secure with Kerlix as directed. 19M Medipore Soft Cloth Surgical T 2x10 (in/yd) ape Discharge Instruction: Secure with tape as directed. Compression Wrap Compression Stockings Add-Ons Electronic Signature(s) Signed: 11/14/2022 5:11:49 PM By: Baruch Gouty RN, BSN Entered By: Baruch Gouty on 11/14/2022 13:39:41 -------------------------------------------------------------------------------- Vitals Details Patient Name: Date of Service: Deans, [name] 11/14/2022 1:15 PM Medical Record Number: [id number] Patient Account Number: [id number] Date of Birth/Sex: Treating RN: [birth year] (62 y.o. [name] Primary Care Desarie Feild: [name] Other Clinician: Referring Ranesha Val: Treating Davidjames Blansett/Extender: Louann Sjogren in Treatment: 16 Vital Signs Time Taken: 13:34 Temperature (F): 98 Height (in): 62 Pulse (bpm): 89 Source: Stated Respiratory Rate (breaths/min): 20 Weight (lbs): 284 Blood Pressure (mmHg): 155/80 Source: Stated Capillary Blood Glucose (mg/dl): 178 Body Mass Index (BMI): 51.9 Reference Range: 80 - 120 mg / dl Notes glucose per pt report this am Electronic Signature(s) Signed: 11/14/2022 5:11:49 PM By: Baruch Gouty RN, BSN Entered By: Baruch Gouty on 11/14/2022 13:40:53

## 2022-11-23 NOTE — Progress Notes (Deleted)
Office Visit Note  Patient: [name]             Date of Birth: Oct 30, 1960           MRN: [id number]             PCP: Shelda Pal, DO Referring: Shelda Pal* Visit Date: 12/07/2022 Occupation: @GUAROCC @  Subjective:    History of Present Illness: [name] is a 62 y.o. female with history of connective tissue disease.  Patient is taking plaquenil 200 mg 1 tablet by mouth twice daily.    BMP and CBC updated on 07/05/22.  Activities of Daily Living:  Patient reports morning stiffness for *** {minute/hour:19697}.   Patient {ACTIONS;DENIES/REPORTS:21021675::"Denies"} nocturnal pain.  Difficulty dressing/grooming: {ACTIONS;DENIES/REPORTS:21021675::"Denies"} Difficulty climbing stairs: {ACTIONS;DENIES/REPORTS:21021675::"Denies"} Difficulty getting out of chair: {ACTIONS;DENIES/REPORTS:21021675::"Denies"} Difficulty using hands for taps, buttons, cutlery, and/or writing: {ACTIONS;DENIES/REPORTS:21021675::"Denies"}  No Rheumatology ROS completed.   PMFS History:  Patient Active Problem List   Diagnosis Date Noted   Osteomyelitis ([place]) 07/07/2022   Medication management 07/07/2022   Diabetic ulcer of left midfoot associated with diabetes mellitus due to underlying condition, with necrosis of muscle ([place])    Diabetic foot infection ([place]) 06/14/2022   Sacral decubitus ulcer, stage II ([place]) 12/24/2021   Class 3 obesity ([place]) 12/23/2021   Iron deficiency anemia 12/23/2021   Acute on chronic diastolic CHF (congestive heart failure) ([place]) 12/22/2021   Hyperkalemia 12/22/2021   Left leg cellulitis 12/20/2021   Peripheral vascular disease, unspecified ([place]) 10/10/2021   Dehiscence of amputation stump (HCC)    Acute blood loss anemia 06/24/2021   Postoperative wound infection 06/24/2021   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 ([place])    Bacteremia due to Enterococcus 05/28/2021   Digestive disorder 05/18/2021   Bilateral  lower extremity edema 05/02/2021   Statin myopathy 03/04/2021   Aortic stenosis 03/04/2021   Sjogren's syndrome ([place]) 02/11/2020   Cutaneous abscess of right foot    Statin intolerance 08/16/2019   Gram-negative bacteremia 10/18/2018   Streptococcal bacteremia 10/18/2018   CAD S/P percutaneous coronary angioplasty 04/19/2018   Essential hypertension    Moderate persistent asthma 03/21/2018   NAFLD (nonalcoholic fatty liver disease)    Recurrent cellulitis of lower extremity 01/02/2018   Cellulitis of lower leg 01/02/2018   Chronic diarrhea 05/08/2017   H/O Clostridium difficile infection 05/08/2017   Rectal bleeding 05/08/2017   Hyperbilirubinemia 04/29/2016   Hyponatremia 04/29/2016   Cellulitis of right foot 03/09/2016   Allergic rhinitis due to pollen 02/15/2016   Anaphylactic reaction due to food 02/10/2016   Type 2 diabetes mellitus with hyperglycemia, with long-term current use of insulin ([place]) 02/10/2016   Gastroesophageal reflux disease without esophagitis 02/10/2016   Atopic eczema 02/10/2016   Cervical nerve root disorder 12/15/2015   Lumbar radiculopathy 12/15/2015   Daytime somnolence 11/26/2015   Venous stasis dermatitis of both lower extremities 02/11/2015   Morbid obesity ([place]) 06/19/2014   Abnormal LFTs 03/26/2014   B-complex deficiency 03/26/2014   Benign essential HTN 03/26/2014   Chronic pain associated with significant psychosocial dysfunction 03/26/2014   Diaphragmatic hernia 03/26/2014   Gastroesophageal reflux disease 03/26/2014   Hammer toe 03/26/2014   H/O neoplasm 03/26/2014   Adaptive colitis 03/26/2014   Deafness, sensorineural 03/26/2014   Fibromyalgia 01/20/2014   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/20/2014   Hyperlipidemia 11/11/2012   Mixed connective tissue disease ([place]) 11/11/2012   Hypothyroidism 99991111   Uncomplicated asthma [id number]   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[place]) 02/20/2012   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     Eczema    Excessive daytime sleepiness 11/26/2015   Fibromyalgia    Gallstones    GERD (gastroesophageal reflux disease)    Heart murmur    "noted for the 1st time on 04/18/2018"   History of blood transfusion 07/2010   "S/P oophorectomy"   History of gout    History of hiatal hernia 1980s   "gone now" (04/18/2018)   Hyperlipidemia    Hypertension    takes Metoprolol and Enalapril daily   Hypothyroidism    takes Synthroid daily   IBS (irritable bowel syndrome)    Migraine    "nothing in the 2000s" (04/18/2018)   Mixed connective tissue disease ([place])    NAFLD (nonalcoholic fatty liver disease)    Pneumonia    "several times" (04/18/2018)   PVC's (premature ventricular contractions)    noted on event monitor 02/2021   Rheumatoid arthritis ([place])    "hands, elbows, shoulders, probably knees" (04/18/2018)   Scoliosis    Sjogren's syndrome ([place])    Sleep apnea    mild - does not use cpap   Spondylosis    Type II diabetes mellitus ([place])    takes Metformin and Hum R daily (04/18/2018)   Walker as ambulation aid    also uses wheelchair    Family History  Problem Relation Age of Onset   CAD Mother    Hypertension Mother    Heart attack Mother    Stroke Mother    CAD Father    Heart attack Father    Allergic rhinitis Father    Asthma Father    Hypertension Brother    Hypertension Brother    Pancreatic cancer  Paternal Aunt    Breast cancer Paternal Aunt    Lung cancer Paternal Aunt    Allergic rhinitis Son    Asthma Son    Past Surgical History:  Procedure Laterality Date   ABDOMINAL HYSTERECTOMY  06/2005   "w/right ovariy"   AMPUTATION Right 01/28/2020    RIGHT FOURTH AND FIFTH RAY AMPUTATION   AMPUTATION Right 01/28/2020   Procedure: RIGHT FOURTH AND FIFTH RAY AMPUTATION,;  Surgeon: Newt Minion, MD;  Location: [facility];  Service: Orthopedics;  Laterality: Right;   AMPUTATION Right 06/01/2021   Procedure: RIGHT BELOW KNEE AMPUTATION;  Surgeon: Newt Minion, MD;  Location: [facility];  Service: Orthopedics;  Laterality: Right;   AMPUTATION Right 07/22/2021   Procedure: RIGHT ABOVE KNEE AMPUTATION;  Surgeon: Newt Minion, MD;  Location: [facility];  Service: Orthopedics;  Laterality: Right;   APPENDECTOMY     BREAST BIOPSY Bilateral    9 total (04/18/2018)   CORONARY ANGIOPLASTY WITH STENT PLACEMENT  10/01/2015   normal LM, 30%  LAD, 85% mid RCA and 95% distal RCA s/p PCI of the mid to distal RCA and now on DAPT with ASA and Ticagrelor.     CORONARY STENT INTERVENTION Right 04/18/2018   Procedure: CORONARY STENT INTERVENTION;  Surgeon: Burnell Blanks, MD;  Location: [facility];  Service: Cardiovascular;  Laterality: Right;   DILATION AND CURETTAGE OF UTERUS     FRACTURE SURGERY     LAPAROSCOPIC CHOLECYSTECTOMY     LEFT HEART CATH AND CORONARY ANGIOGRAPHY N/A 04/18/2018   Procedure: LEFT HEART CATH AND CORONARY ANGIOGRAPHY;  Surgeon: Burnell Blanks, MD;  Location: [facility];  Service: Cardiovascular;  Laterality: N/A;   MUSCLE BIOPSY Left    "leg"   OOPHORECTOMY  07/2010   RADIOLOGY WITH ANESTHESIA N/A 05/25/2016   Procedure: RADIOLOGY WITH ANESTHESIA;  Surgeon: Medication Radiologist, MD;  Location: [facility];  Service: Radiology;  Laterality: N/A;   STUMP REVISION Right 06/29/2021   Procedure: REVISION RIGHT BELOW KNEE AMPUTATION;  Surgeon: Newt Minion, MD;  Location:  [facility];  Service: Orthopedics;  Laterality: Right;   TEE WITHOUT CARDIOVERSION N/A 06/01/2021   Procedure: TRANSESOPHAGEAL ECHOCARDIOGRAM (TEE);  Surgeon: Geralynn Rile, MD;  Location: [facility];  Service: Cardiovascular;  Laterality: N/A;   TONSILLECTOMY AND ADENOIDECTOMY     WRIST FRACTURE SURGERY Left    "crushed it"   Social History   Social History Narrative   Not on file   Immunization History  Administered Date(s) Administered   Influenza Split 06/19/2012, 07/15/2013   Influenza,inj,Quad PF,6+ Mos 06/16/2015, 06/30/2015   Influenza,inj,quad, With Preservative 06/19/2014   Influenza-Unspecified 06/19/2012, 07/15/2013   Pneumococcal Polysaccharide-23 02/25/2014   Tdap 02/17/2013, 02/25/2014     Objective: Vital Signs: There were no vitals taken for this visit.   Physical Exam Vitals and nursing note reviewed.  Constitutional:      Appearance: She is well-developed.  HENT:     Head: Normocephalic and atraumatic.  Eyes:     Conjunctiva/sclera: Conjunctivae normal.  Cardiovascular:     Rate and Rhythm: Normal rate and regular rhythm.     Heart sounds: Normal heart sounds.  Pulmonary:     Effort: Pulmonary effort is normal.     Breath sounds: Normal breath sounds.  Abdominal:     General: Bowel sounds are normal.     Palpations: Abdomen is soft.  Musculoskeletal:     Cervical back: Normal range of motion.  Skin:    General: Skin is warm and dry.     Capillary Refill: Capillary refill takes less than 2 seconds.  Neurological:     Mental Status: She is alert and oriented to person, place, and time.  Psychiatric:        Behavior: Behavior normal.      Musculoskeletal Exam: ***  CDAI Exam: CDAI Score: -- Patient Global: --; Provider Global: -- Swollen: --; Tender: -- Joint Exam 12/07/2022   No joint exam has been documented for this visit   There is currently no information documented on the homunculus. Go to the Rheumatology activity and complete the  homunculus joint exam.  Investigation: No additional findings.  Imaging: No results found.  Recent Labs: Lab Results  Component Value Date   WBC 7.4 07/05/2022   HGB 9.8 Repeated and verified X2. (L) 07/05/2022   PLT 317.0 07/05/2022   NA 139 07/05/2022   K 3.9 07/05/2022   CL 97 07/05/2022   CO2 30 07/05/2022   GLUCOSE 224 (H) 07/05/2022  BUN 11 07/05/2022   CREATININE 0.66 07/05/2022   BILITOT 0.3 06/14/2022   ALKPHOS 69 06/14/2022   AST 32 06/14/2022   ALT 22 06/14/2022   PROT 7.4 06/14/2022   ALBUMIN 3.0 (L) 06/14/2022   CALCIUM 9.5 07/05/2022   GFRAA 87 05/04/2020    Speciality Comments: No specialty comments available.  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 [id number]. +ANA,+RNP,sicca,arthralgias, myalgias. Ttd withPLQ since 2011.  High risk medication use - hydroxychloroquine 200 mg p.o. twice daily since 2011  Primary osteoarthritis of both hands  Unilateral AKA, right (HCC)  Recurrent cellulitis of lower extremity  Dehiscence of amputation stump (HCC)  Primary osteoarthritis of left foot  DDD (degenerative disc disease), lumbar  Sacral decubitus ulcer, stage II (HCC)  Fibromyalgia  Chronic pain associated with significant psychosocial dysfunction  Peripheral vascular disease, unspecified ([place])  Streptococcal bacteremia  CAD S/P percutaneous coronary angioplasty  Type 2 diabetes mellitus with hyperglycemia, with long-term current use of insulin (HCC)  Moderate persistent asthma with acute  exacerbation  Acute on chronic diastolic CHF (congestive heart failure) (HCC)  NAFLD (nonalcoholic fatty liver disease)  Essential hypertension  Gastroesophageal reflux disease without esophagitis  Adaptive colitis  History of hypothyroidism  Deafness, sensorineural  H/O Clostridium difficile infection  Orders: No orders of the defined types were placed in this encounter.  No orders of the defined types were placed in this encounter.    Follow-Up Instructions: No follow-ups on file.   Ofilia Neas, PA-C  Note - This record has been created using Dragon software.  Chart creation errors have been sought, but may not always  have been located. Such creation errors do not reflect on  the standard of medical care.

## 2022-11-27 ENCOUNTER — Encounter (HOSPITAL_BASED_OUTPATIENT_CLINIC_OR_DEPARTMENT_OTHER): Payer: Commercial Managed Care - HMO | Admitting: Internal Medicine

## 2022-11-28 ENCOUNTER — Encounter: Payer: Commercial Managed Care - HMO | Attending: Registered Nurse | Admitting: Registered Nurse

## 2022-11-28 ENCOUNTER — Encounter: Payer: Self-pay | Admitting: Registered Nurse

## 2022-11-28 VITALS — BP 118/71 | HR 83 | Ht 62.0 in | Wt 284.0 lb

## 2022-11-28 DIAGNOSIS — M5416 Radiculopathy, lumbar region: Secondary | ICD-10-CM | POA: Diagnosis present

## 2022-11-28 DIAGNOSIS — Z5181 Encounter for therapeutic drug level monitoring: Secondary | ICD-10-CM | POA: Diagnosis not present

## 2022-11-28 DIAGNOSIS — Z79899 Other long term (current) drug therapy: Secondary | ICD-10-CM | POA: Diagnosis present

## 2022-11-28 DIAGNOSIS — M7062 Trochanteric bursitis, left hip: Secondary | ICD-10-CM | POA: Insufficient documentation

## 2022-11-28 DIAGNOSIS — G894 Chronic pain syndrome: Secondary | ICD-10-CM | POA: Insufficient documentation

## 2022-11-28 DIAGNOSIS — G8929 Other chronic pain: Secondary | ICD-10-CM | POA: Insufficient documentation

## 2022-11-28 DIAGNOSIS — M7061 Trochanteric bursitis, right hip: Secondary | ICD-10-CM | POA: Insufficient documentation

## 2022-11-28 DIAGNOSIS — M2550 Pain in unspecified joint: Secondary | ICD-10-CM | POA: Diagnosis present

## 2022-11-28 DIAGNOSIS — Z89611 Acquired absence of right leg above knee: Secondary | ICD-10-CM | POA: Diagnosis present

## 2022-11-28 DIAGNOSIS — M25511 Pain in right shoulder: Secondary | ICD-10-CM | POA: Diagnosis present

## 2022-11-28 DIAGNOSIS — M25512 Pain in left shoulder: Secondary | ICD-10-CM | POA: Diagnosis present

## 2022-11-28 MED ORDER — OXYCODONE HCL 20 MG PO TABS: 1.0000 | ORAL_TABLET | Freq: Three times a day (TID) | ORAL | 0 refills | Status: DC | PRN

## 2022-11-28 NOTE — Progress Notes (Signed)
Subjective:    Patient ID: [name], female    DOB: Oct 14, 1960, 62 y.o.   MRN: [id number]  HPI: [name] is a 62 y.o. female who returns for follow up appointment for chronic pain and medication refill. She states her pain is located in her bilateral shoulders, lower back pain and bilateral pain. Also reports generalized joint pain. She rates her pain 8. Her current exercise regime is walking and performing stretching exercises.  Ms. Medaglia Morphine equivalent is 90.00 MME.   Oral Swab was Performed today.      Pain Inventory Average Pain 7 Pain Right Now 8 My pain is constant, sharp, burning, dull, stabbing, tingling, and aching  In the last 24 hours, has pain interfered with the following? General activity 10 Relation with others 10 Enjoyment of life 10 What TIME of day is your pain at its worst? night Sleep (in general) Poor  Pain is worse with: some activites Pain improves with: rest, heat/ice, medication, and injections Relief from Meds: 6  Family History  Problem Relation Age of Onset   CAD Mother    Hypertension Mother    Heart attack Mother    Stroke Mother    CAD Father    Heart attack Father    Allergic rhinitis Father    Asthma Father    Hypertension Brother    Hypertension Brother    Pancreatic cancer Paternal Aunt    Breast cancer Paternal Aunt    Lung cancer Paternal Aunt    Allergic rhinitis Son    Asthma Son    Social History   Socioeconomic History   Marital status: Married    Spouse name: [name] Addo   Number of children: 1   Years of education: 11   Highest education level: GED or equivalent  Occupational History    Comment: Not currrently working ( last worked 2004)  Tobacco Use   Smoking status: Former    Packs/day: 1.00    Years: 19.00    Additional pack years: 0.00    Total pack years: 19.00    Types: Cigarettes    Start date: 78    Quit date: 02/11/2004    Years since quitting: 18.8    Passive exposure: Past    Smokeless tobacco: Never  Vaping Use   Vaping Use: Never used  Substance and Sexual Activity   Alcohol use: Yes    Comment: RARE Wine   Drug use: Not Currently    Types: Marijuana    Comment: "only in my teens"   Sexual activity: Yes    Birth control/protection: Surgical    Comment: Hysterectomy  Other Topics Concern   Not on file  Social History Narrative   Not on file   Social Determinants of Health   Financial Resource Strain: Low Risk  (12/27/2021)   Overall Financial Resource Strain (CARDIA)    Difficulty of Paying Living Expenses: Not hard at all  Food Insecurity: No Food Insecurity (06/14/2022)   Hunger Vital Sign    Worried About Running Out of Food in the Last Year: Never true    Ran Out of Food in the Last Year: Never true  Transportation Needs: No Transportation Needs (06/14/2022)   PRAPARE - Hydrologist (Medical): No    Lack of Transportation (Non-Medical): No  Physical Activity: Inactive (08/25/2021)   Exercise Vital Sign    Days of Exercise per Week: 0 days    Minutes of Exercise per  Session: 0 min  Stress: Stress Concern Present (08/25/2021)   [place]    Feeling of Stress : Very much  Social Connections: Moderately Integrated (08/25/2021)   Social Connection and Isolation Panel [NHANES]    Frequency of Communication with Friends and Family: More than three times a week    Frequency of Social Gatherings with Friends and Family: More than three times a week    Attends Religious Services: More than 4 times per year    Active Member of [employer] or Organizations: No    Attends Archivist Meetings: Never    Marital Status: Married   Past Surgical History:  Procedure Laterality Date   ABDOMINAL HYSTERECTOMY  06/2005   "w/right ovariy"   AMPUTATION Right 01/28/2020    RIGHT FOURTH AND FIFTH RAY AMPUTATION   AMPUTATION Right 01/28/2020   Procedure: RIGHT  FOURTH AND FIFTH RAY AMPUTATION,;  Surgeon: Newt Minion, MD;  Location: [place];  Service: Orthopedics;  Laterality: Right;   AMPUTATION Right 06/01/2021   Procedure: RIGHT BELOW KNEE AMPUTATION;  Surgeon: Newt Minion, MD;  Location: [place];  Service: Orthopedics;  Laterality: Right;   AMPUTATION Right 07/22/2021   Procedure: RIGHT ABOVE KNEE AMPUTATION;  Surgeon: Newt Minion, MD;  Location: [place];  Service: Orthopedics;  Laterality: Right;   APPENDECTOMY     BREAST BIOPSY Bilateral    9 total (04/18/2018)   CORONARY ANGIOPLASTY WITH STENT PLACEMENT  10/01/2015   normal LM, 30% LAD, 85% mid RCA and 95% distal RCA s/p PCI of the mid to distal RCA and now on DAPT with ASA and Ticagrelor.     CORONARY STENT INTERVENTION Right 04/18/2018   Procedure: CORONARY STENT INTERVENTION;  Surgeon: Burnell Blanks, MD;  Location: [facility];  Service: Cardiovascular;  Laterality: Right;   DILATION AND CURETTAGE OF UTERUS     FRACTURE SURGERY     LAPAROSCOPIC CHOLECYSTECTOMY     LEFT HEART CATH AND CORONARY ANGIOGRAPHY N/A 04/18/2018   Procedure: LEFT HEART CATH AND CORONARY ANGIOGRAPHY;  Surgeon: Burnell Blanks, MD;  Location: [facility];  Service: Cardiovascular;  Laterality: N/A;   MUSCLE BIOPSY Left    "leg"   OOPHORECTOMY  07/2010   RADIOLOGY WITH ANESTHESIA N/A 05/25/2016   Procedure: RADIOLOGY WITH ANESTHESIA;  Surgeon: Medication Radiologist, MD;  Location: [place];  Service: Radiology;  Laterality: N/A;   STUMP REVISION Right 06/29/2021   Procedure: REVISION RIGHT BELOW KNEE AMPUTATION;  Surgeon: Newt Minion, MD;  Location: [place];  Service: Orthopedics;  Laterality: Right;   TEE WITHOUT CARDIOVERSION N/A 06/01/2021   Procedure: TRANSESOPHAGEAL ECHOCARDIOGRAM (TEE);  Surgeon: Geralynn Rile, MD;  Location: [place];  Service: Cardiovascular;  Laterality: N/A;   TONSILLECTOMY AND ADENOIDECTOMY     WRIST FRACTURE SURGERY Left    "crushed it"   Past Surgical  History:  Procedure Laterality Date   ABDOMINAL HYSTERECTOMY  06/2005   "w/right ovariy"   AMPUTATION Right 01/28/2020    RIGHT FOURTH AND FIFTH RAY AMPUTATION   AMPUTATION Right 01/28/2020   Procedure: RIGHT FOURTH AND FIFTH RAY AMPUTATION,;  Surgeon: Newt Minion, MD;  Location: [place];  Service: Orthopedics;  Laterality: Right;   AMPUTATION Right 06/01/2021   Procedure: RIGHT BELOW KNEE AMPUTATION;  Surgeon: Newt Minion, MD;  Location: [place];  Service: Orthopedics;  Laterality: Right;   AMPUTATION Right 07/22/2021   Procedure: RIGHT ABOVE KNEE  AMPUTATION;  Surgeon: Newt Minion, MD;  Location: [place];  Service: Orthopedics;  Laterality: Right;   APPENDECTOMY     BREAST BIOPSY Bilateral    9 total (04/18/2018)   CORONARY ANGIOPLASTY WITH STENT PLACEMENT  10/01/2015   normal LM, 30% LAD, 85% mid RCA and 95% distal RCA s/p PCI of the mid to distal RCA and now on DAPT with ASA and Ticagrelor.     CORONARY STENT INTERVENTION Right 04/18/2018   Procedure: CORONARY STENT INTERVENTION;  Surgeon: Burnell Blanks, MD;  Location: [place] [facility];  Service: Cardiovascular;  Laterality: Right;   DILATION AND CURETTAGE OF UTERUS     FRACTURE SURGERY     LAPAROSCOPIC CHOLECYSTECTOMY     LEFT HEART CATH AND CORONARY ANGIOGRAPHY N/A 04/18/2018   Procedure: LEFT HEART CATH AND CORONARY ANGIOGRAPHY;  Surgeon: Burnell Blanks, MD;  Location: [facility];  Service: Cardiovascular;  Laterality: N/A;   MUSCLE BIOPSY Left    "leg"   OOPHORECTOMY  07/2010   RADIOLOGY WITH ANESTHESIA N/A 05/25/2016   Procedure: RADIOLOGY WITH ANESTHESIA;  Surgeon: Medication Radiologist, MD;  Location: [place];  Service: Radiology;  Laterality: N/A;   STUMP REVISION Right 06/29/2021   Procedure: REVISION RIGHT BELOW KNEE AMPUTATION;  Surgeon: Newt Minion, MD;  Location: [place];  Service: Orthopedics;  Laterality: Right;   TEE WITHOUT CARDIOVERSION N/A 06/01/2021   Procedure: TRANSESOPHAGEAL  ECHOCARDIOGRAM (TEE);  Surgeon: Geralynn Rile, MD;  Location: [place];  Service: Cardiovascular;  Laterality: N/A;   TONSILLECTOMY AND ADENOIDECTOMY     WRIST FRACTURE SURGERY Left    "crushed it"   Past Medical History:  Diagnosis Date   Aortic stenosis    mild AS by echo 02/2021   Asthma    "on daily RX and rescue inhaler" (04/18/2018)   Chronic diastolic CHF (congestive heart failure) ([place]) 09/2015   Chronic lower back pain    Chronic neck pain    Chronic pain syndrome    Fentanyl Patch   Colon polyps    Coronary artery disease    cath with normal LM, 30% LAD, 85% mid RCA and 95% distal RCA s/p PCI of the mid to distal RCA and now on DAPT with ASA and Ticagrelor.     Eczema    Excessive daytime sleepiness 11/26/2015   Fibromyalgia    Gallstones    GERD (gastroesophageal reflux disease)    Heart murmur    "noted for the 1st time on 04/18/2018"   History of blood transfusion 07/2010   "S/P oophorectomy"   History of gout    History of hiatal hernia 1980s   "gone now" (04/18/2018)   Hyperlipidemia    Hypertension    takes Metoprolol and Enalapril daily   Hypothyroidism    takes Synthroid daily   IBS (irritable bowel syndrome)    Migraine    "nothing in the 2000s" (04/18/2018)   Mixed connective tissue disease ([place])    NAFLD (nonalcoholic fatty liver disease)    Pneumonia    "several times" (04/18/2018)   PVC's (premature ventricular contractions)    noted on event monitor 02/2021   Rheumatoid arthritis ([place])    "hands, elbows, shoulders, probably knees" (04/18/2018)   Scoliosis    Sjogren's syndrome ([place])    Sleep apnea    mild - does not use cpap   Spondylosis    Type II diabetes mellitus ([place])    takes Metformin and Hum R daily (04/18/2018)  Walker as ambulation aid    also uses wheelchair   BP 118/71   Pulse 83   Ht 5\' 2"  (1.575 m)   Wt 284 lb (128.8 kg) Comment: last recorded  SpO2 98%   BMI 51.94 kg/m   Opioid Risk Score:   Fall Risk Score:   `1  Depression screen [facility] 2/9     11/28/2022    9:45 AM 08/22/2022   11:21 AM 07/07/2022    9:16 AM 06/27/2022   11:39 AM 05/02/2022   11:56 AM 03/30/2022   11:08 AM 02/27/2022    1:37 PM  Depression screen PHQ 2/9  Decreased Interest 1 1 0 1 1 1 1   Down, Depressed, Hopeless 1 1 1 1 1 1 1   PHQ - 2 Score 2 2 1 2 2 2 2     Review of Systems  Constitutional: Negative.   HENT: Negative.    Eyes: Negative.   Respiratory: Negative.    Cardiovascular: Negative.   Gastrointestinal: Negative.   Endocrine: Negative.   Genitourinary: Negative.   Musculoskeletal:  Positive for arthralgias, back pain, gait problem and myalgias.  Skin:  Positive for wound.  Allergic/Immunologic: Negative.   Neurological:        Tingling  Hematological: Negative.   Psychiatric/Behavioral:  Positive for dysphoric mood.   All other systems reviewed and are negative.      Objective:   Physical Exam Vitals and nursing note reviewed.  Constitutional:      Appearance: Normal appearance.  Cardiovascular:     Rate and Rhythm: Normal rate and regular rhythm.     Pulses: Normal pulses.     Heart sounds: Normal heart sounds.  Pulmonary:     Effort: Pulmonary effort is normal.     Breath sounds: Normal breath sounds.  Musculoskeletal:     Cervical back: Normal range of motion and neck supple.     Comments: Normal Muscle Bulk and Muscle Testing Reveals:  Upper Extremities: Decreased ROM 30 Degrees  and Muscle Strength 5/5 Bilateral AC Joint Tenderness  Thoracic Paraspinal Tenderness: T-7-T-9 Lumbar Paraspinal Tenderness: L-3-L-5 Lower Extremities: Right: AKA Left Lower Extremity: Decreased ROM and Muscle Strength 4/5 Left Lower Extremity Ace Wrap Intact Decreased ROM and Muscle Strength 4/5 Left Lower Extremity Flexion Produces Pain into her Left Patella Arrived in wheelchair      Skin:    General: Skin is warm and dry.  Neurological:     Mental Status: She is alert and oriented to person, place,  and time.  Psychiatric:        Mood and Affect: Mood normal.        Behavior: Behavior normal.         Assessment & Plan:  HX: of Right AKA: Dr Sharol Given Following. Continue to Monitor. 11/28/2022 Diabetic Polyneuropathy associated with Type 2 DM: Continue current medication regimen with Lyrica. Continue to Monitor. 11/28/2022 DM Type 2: PCP Following. Continue current medication regimen. Continue to Monitor. 11/28/2022 Chronic  Pain Syndrome : Refilled: Oxycodone 20 mg one tablet every 8 hours as needed for pain #90.  We will continue the opioid monitoring program, this consists of regular clinic visits, examinations, urine drug screen, pill counts as well as use of [place] Controlled Substance Reporting system. A 12 month History has been reviewed on the [place] Controlled Substance Reporting System on 11/28/2022 Stage 1 Decubitus: Left Buttock and Left Lower Extremity: Continue Adequate Nutrition and Weight Shifting.  Wound Care following. Continue to Monitor. 11/28/2022  6. Cellulitis: Ms. Kittner was hospitalized on 06/14/2022- 06/20/2022, discharge summary was reviewed.  Continue to Monitpr. PCP Following.11/28/2022 7. Phantom Pain: Continue Gabapentin  Continue to Monitor. 11/28/2022 8. Polyarthralgia: Continue HEP as Tolerated. Continue to Monitor. 11/28/2022 9. Chronic Bilateral Shoulder Pain: Continue HEP as tolerated. Continue to Monitor.11/28/2022  10. Chronic Bilateral Lower Back Pain without Sciatica: Continue Lidocaine Patches 12 hours on and 12 hours off, she verbalizes understanding. Continue HEP as tolerated. Continue current medication regimen. Continue to Monitor. 11/28/2022 11. Greater Trochanter Bursitis: Continue to Alternate Ice and Heat Therapy. Continue to Monitor. 11/28/2022 12. Left Foot Ulcer: Husband performing dressing changes. Wound Care Following and Dr Sharol Given Following. Continue to Monitor. 11/28/2022

## 2022-12-01 LAB — DRUG TOX MONITOR 1 W/CONF, ORAL FLD
Amphetamines: NEGATIVE ng/mL (ref <10)
Barbiturates: NEGATIVE ng/mL (ref <10)
Benzodiazepines: NEGATIVE ng/mL (ref <0.50)
Buprenorphine: NEGATIVE ng/mL (ref <0.10)
Buprenorphine: NEGATIVE ng/mL (ref <0.10)
Cocaine: NEGATIVE ng/mL (ref <5.0)
Codeine: NEGATIVE ng/mL (ref <2.5)
Dihydrocodeine: NEGATIVE ng/mL (ref <2.5)
Fentanyl: NEGATIVE ng/mL (ref <0.10)
Heroin Metabolite: NEGATIVE ng/mL (ref <1.0)
Hydrocodone: NEGATIVE ng/mL (ref <2.5)
Hydromorphone: NEGATIVE ng/mL (ref <2.5)
MARIJUANA: NEGATIVE ng/mL (ref <2.5)
MDMA: NEGATIVE ng/mL (ref <10)
Meprobamate: NEGATIVE ng/mL (ref <2.5)
Methadone: NEGATIVE ng/mL (ref <5.0)
Morphine: NEGATIVE ng/mL (ref <2.5)
Naloxone: NEGATIVE ng/mL (ref <0.25)
Nicotine Metabolite: NEGATIVE ng/mL (ref <5.0)
Norhydrocodone: NEGATIVE ng/mL (ref <2.5)
Noroxycodone: 92.5 ng/mL — ABNORMAL HIGH (ref <2.5)
Opiates: POSITIVE ng/mL — AB (ref <2.5)
Oxycodone: >250 ng/mL — ABNORMAL HIGH (ref <2.5)
Oxymorphone: 15.9 ng/mL — ABNORMAL HIGH (ref <2.5)
Phencyclidine: NEGATIVE ng/mL (ref <10)
Tapentadol: NEGATIVE ng/mL (ref <5.0)
Tramadol: NEGATIVE ng/mL (ref <5.0)
Zolpidem: NEGATIVE ng/mL (ref <5.0)

## 2022-12-01 LAB — DRUG TOX ALC METAB W/CON, ORAL FLD: Alcohol Metabolite: NEGATIVE ng/mL (ref <25)

## 2022-12-06 ENCOUNTER — Ambulatory Visit (HOSPITAL_BASED_OUTPATIENT_CLINIC_OR_DEPARTMENT_OTHER): Payer: Commercial Managed Care - HMO | Admitting: General Surgery

## 2022-12-07 ENCOUNTER — Ambulatory Visit: Payer: Commercial Managed Care - HMO | Admitting: Physician Assistant

## 2022-12-07 DIAGNOSIS — J4541 Moderate persistent asthma with (acute) exacerbation: Secondary | ICD-10-CM

## 2022-12-07 DIAGNOSIS — K589 Irritable bowel syndrome without diarrhea: Secondary | ICD-10-CM

## 2022-12-07 DIAGNOSIS — S78111A Complete traumatic amputation at level between right hip and knee, initial encounter: Secondary | ICD-10-CM

## 2022-12-07 DIAGNOSIS — M797 Fibromyalgia: Secondary | ICD-10-CM

## 2022-12-07 DIAGNOSIS — Z79899 Other long term (current) drug therapy: Secondary | ICD-10-CM

## 2022-12-07 DIAGNOSIS — Z8639 Personal history of other endocrine, nutritional and metabolic disease: Secondary | ICD-10-CM

## 2022-12-07 DIAGNOSIS — L89152 Pressure ulcer of sacral region, stage 2: Secondary | ICD-10-CM

## 2022-12-07 DIAGNOSIS — I739 Peripheral vascular disease, unspecified: Secondary | ICD-10-CM

## 2022-12-07 DIAGNOSIS — M19041 Primary osteoarthritis, right hand: Secondary | ICD-10-CM

## 2022-12-07 DIAGNOSIS — T8781 Dehiscence of amputation stump: Secondary | ICD-10-CM

## 2022-12-07 DIAGNOSIS — G894 Chronic pain syndrome: Secondary | ICD-10-CM

## 2022-12-07 DIAGNOSIS — M19072 Primary osteoarthritis, left ankle and foot: Secondary | ICD-10-CM

## 2022-12-07 DIAGNOSIS — B955 Unspecified streptococcus as the cause of diseases classified elsewhere: Secondary | ICD-10-CM

## 2022-12-07 DIAGNOSIS — I2510 Atherosclerotic heart disease of native coronary artery without angina pectoris: Secondary | ICD-10-CM

## 2022-12-07 DIAGNOSIS — I5033 Acute on chronic diastolic (congestive) heart failure: Secondary | ICD-10-CM

## 2022-12-07 DIAGNOSIS — K219 Gastro-esophageal reflux disease without esophagitis: Secondary | ICD-10-CM

## 2022-12-07 DIAGNOSIS — M5136 Other intervertebral disc degeneration, lumbar region: Secondary | ICD-10-CM

## 2022-12-07 DIAGNOSIS — K760 Fatty (change of) liver, not elsewhere classified: Secondary | ICD-10-CM

## 2022-12-07 DIAGNOSIS — I10 Essential (primary) hypertension: Secondary | ICD-10-CM

## 2022-12-07 DIAGNOSIS — M351 Other overlap syndromes: Secondary | ICD-10-CM

## 2022-12-07 DIAGNOSIS — Z8619 Personal history of other infectious and parasitic diseases: Secondary | ICD-10-CM

## 2022-12-07 DIAGNOSIS — H905 Unspecified sensorineural hearing loss: Secondary | ICD-10-CM

## 2022-12-07 DIAGNOSIS — E1165 Type 2 diabetes mellitus with hyperglycemia: Secondary | ICD-10-CM

## 2022-12-07 DIAGNOSIS — L03119 Cellulitis of unspecified part of limb: Secondary | ICD-10-CM

## 2022-12-12 ENCOUNTER — Encounter (HOSPITAL_BASED_OUTPATIENT_CLINIC_OR_DEPARTMENT_OTHER): Payer: Commercial Managed Care - HMO | Attending: General Surgery | Admitting: General Surgery

## 2022-12-12 ENCOUNTER — Telehealth: Payer: Self-pay | Admitting: *Deleted

## 2022-12-12 DIAGNOSIS — E1151 Type 2 diabetes mellitus with diabetic peripheral angiopathy without gangrene: Secondary | ICD-10-CM | POA: Insufficient documentation

## 2022-12-12 DIAGNOSIS — M869 Osteomyelitis, unspecified: Secondary | ICD-10-CM | POA: Insufficient documentation

## 2022-12-12 DIAGNOSIS — I110 Hypertensive heart disease with heart failure: Secondary | ICD-10-CM | POA: Insufficient documentation

## 2022-12-12 DIAGNOSIS — I5032 Chronic diastolic (congestive) heart failure: Secondary | ICD-10-CM | POA: Diagnosis not present

## 2022-12-12 DIAGNOSIS — M3500 Sicca syndrome, unspecified: Secondary | ICD-10-CM | POA: Diagnosis not present

## 2022-12-12 DIAGNOSIS — Z89611 Acquired absence of right leg above knee: Secondary | ICD-10-CM | POA: Insufficient documentation

## 2022-12-12 DIAGNOSIS — E11621 Type 2 diabetes mellitus with foot ulcer: Secondary | ICD-10-CM | POA: Insufficient documentation

## 2022-12-12 DIAGNOSIS — L97423 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: Secondary | ICD-10-CM | POA: Insufficient documentation

## 2022-12-12 DIAGNOSIS — L304 Erythema intertrigo: Secondary | ICD-10-CM | POA: Insufficient documentation

## 2022-12-12 NOTE — Progress Notes (Signed)
Hausner, [name] ([id number]) [id number] Page 1 of 13 Visit Report for 12/12/2022 Chief Complaint Document Details Patient Name: Date of Service: [name], [name] [name] 12/12/2022 10:00 A [name] Medical Record Number: [id number] Patient Account Number: [id number] Date of Birth/Sex: Treating RN: 01-28-1961 (62 y.o. F) Primary Care Provider: [name] Other Clinician: Referring Provider: Treating Provider/Extender: [name] in Treatment: 20 Information Obtained from: Patient Chief Complaint 10/17/2021; multiple scattered wounds to the abdomen, posterior left leg, left labia and sacrum 07/19/2022: left foot DFU Electronic Signature(s) Signed: 12/12/2022 10:21:35 AM By: Fredirick Maudlin MD FACS Entered By: Fredirick Maudlin on 12/12/2022 10:21:34 -------------------------------------------------------------------------------- Debridement Details Patient Name: Date of Service: [name], [name] [name] 12/12/2022 10:00 A [name] Medical Record Number: [id number] Patient Account Number: [id number] Date of Birth/Sex: Treating RN: 1961-08-17 (62 y.o. [name], Meta.Reding Primary Care Provider: [name] Other Clinician: Referring Provider: Treating Provider/Extender: [name] in Treatment: 20 Debridement Performed for Assessment: Wound #[id number] [place] Performed By: Physician Fredirick Maudlin, MD Debridement Type: Debridement Severity of Tissue Pre Debridement: Fat layer exposed Level of Consciousness (Pre-procedure): Awake and Alert Pre-procedure Verification/Time Out Yes - 10:10 Taken: Start Time: 10:11 Pain Control: Lidocaine 4% T opical Solution T Area Debrided (L x W): otal 10.3 (cm) x 3.5 (cm) = 36.05 (cm) Tissue and other material debrided: Viable, Non-Viable, Eschar, Skin: Dermis , Skin: Epidermis, Biofilm Level: Skin/Epidermis Debridement Description: Selective/Open Wound Instrument:  Curette Bleeding: Minimum Hemostasis Achieved: Pressure End Time: 10:18 Procedural Pain: 0 Post Procedural Pain: 0 Response to Treatment: Procedure was tolerated well Level of Consciousness (Post- Awake and Alert procedure): Post Debridement Measurements of Total Wound Length: (cm) 10.1 Width: (cm) 3.3 Depth: (cm) 0.2 Volume: (cm) 5.235 Character of Wound/Ulcer Post Debridement: Improved Severity of Tissue Post Debridement: Fat layer exposed Post Procedure Diagnosis Same as Pre-procedure Electronic Signature(s) Signed: 12/12/2022 12:34:56 PM By: Fredirick Maudlin MD FACS Signed: 12/12/2022 4:09:50 PM By: Deon Pilling RN, BSN [name], [name] ([id number]) PM By: Deon Pilling RN, BSN [phone].pdf Page 2 of 13 Signed: 12/12/2022 4:09:50 Entered By: Deon Pilling on 12/12/2022 10:18:45 -------------------------------------------------------------------------------- HPI Details Patient Name: Date of Service: [name], [name] [name] 12/12/2022 10:00 A [name] Medical Record Number: [id number] Patient Account Number: [id number] Date of Birth/Sex: Treating RN: Jun 16, 1961 (62 y.o. F) Primary Care Provider: [name] Other Clinician: Referring Provider: Treating Provider/Extender: [name] in Treatment: 20 History of Present Illness HPI Description: 07/23/2019 on evaluation today patient presents with a myriad of wounds noted at multiple locations over her right heel, right lower extremity, and abdominal region. She also has bilateral lower extremity lymphedema which is quite significant as well. Incidentally she also has congestive heart failure and hypertension. She also is obese. With that being said I think all this is contributing as well to her lower extremity edema which is very much uncontrolled. It seems like its been at least several months since she is worn any compression according to what she tells me. With that being said I  am not sure exactly when that would have been. She does have fibrotic changes in the lower extremity secondary to lymphedema worse on the left than the right. She did show me pictures of wound she had on the anterior portion of her shin which was quite significant fortunately that has healed. These issues have been intermittent at this time. Again I think that with appropriate compression therapy she may actually be doing better than what we are seeing at  this point but again I am not really sure that she is ever been extremely compliant with that. Fortunately there is no signs of active infection at this time. No fever chills noted. As far as the abdominal ulcer she initially had one wound that she thinks may have been a bug bite. Subsequently she was put a dressing on this and she states that the tape pulled skin off on other locations causing other wounds that have not healed that is been about 3 months. The wounds on her lower extremities have been intermittent over 3 years. This includes the heel. 11/19; this is a patient that I have not seen previously. She was admitted to our clinic last week with multiple wounds including several superficial circular areas on her abdomen predominantly right upper quadrant. Also 1 in her umbilicus. She has an area on her right lateral malleolus which was new today. She has bilateral lower extremity edema with very significant stasis dermatitis on the left anterior tibial area. She has tightly adherent skin in her lower extremities probably secondary to cutaneous fibrosis in the area. We put her in compression last week. She comes in with the dressings reasonably saturated. 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 She has multiple areas on her arms from  scratching. 12/3; the patient I saw for the first time 2 weeks ago. She has 3 small circular areas on her abdomen 2 in the right upper quadrant one on her umbilicus. We have been using silver alginate to this area. She also had an area on her right lateral malleolus and right heel and right medial malleolus. We have been using silver alginate here under compression. She does not have an open area on the left leg today. We are going to order her stockings for the left leg. She clearly has chronic lymphedema in these areas. X-ray of the foot from 10/20 did not show any fracture or dislocation. The heel wound was noted there was no evidence of osteomyelitis She complains of generalized pruritus. 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 She has longstanding lymphedema. Although she does not have known liver disease I looked at his CT scan of the abdomen from February of this year that showed hepatic steatosis 12/11; patient still has no open area on the left leg we transitioned her into a stocking she had. Her stockings from [facility] are supposed to be arriving later today which will be the stockings of choice. The only wound remaining on the right is the right heel 2 small superficial open areas. Everything else is well on its way to healing in the right leg few small excoriations. She has 1 major area remaining on her abdomen the rest seem to be healing 12/22 patient still has no open area on the left leg and she is still in a stocking. She had still 2 open areas on the tip of her right heel. These look like pressure related areas although the patient is not really certain how this is happening. She has an open heeled [name] that we provided. Still has 1 open area on the right lateral abdomen The patient has complaints of generalized pruritus. She has multiple excoriated areas on her abdomen that of close over her arms or thighs which I think are from scratching. She  tells me that  she has never had a basic work-up for this which might include basic lab work, liver functions, kidney functions, thyroid etc. She might also benefit from a dermatologist 12/29; the patient has a deeper larger more painful wound on the tip of her right heel. Again these look like pressure ulcers although she wears an open toed heel pads or heel at night to prevent it from hitting the mattress etc. They tell me and remind me that this is been present on and off for about 2 years that has closed over but then will reopen. I did look over her lab work that was available in [employer]. She does not have elevated liver function tests or creatinine. I was not able to see a TSH. This was in response to her complaints of generalized itching and a itch/scratch cycle. I also noted that she is on OxyContin and oxycodone and of course narcotics can cause generalized pruritus as a side effect Readmission: History From [place], [place] Last Week: 03/29/2021 this is a patient who presents for initial evaluation here in the clinic though I have seen her 1 time previously in 2020 this was in [place]. That was actually her initial visit therefore a heel ulceration. Subsequently that did not healing she actually saw me the 1 time in November and then subsequently saw Dr. Dellia Nims through the end of December where she apparently was healed. 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 This is on the right. With that being said right now she has a basically region on the lateral portion of the ray site where she is applying more pressure especially as her foot seems to be starting to turn in and this has become an increasingly significant issue for her to be honest. She does see Dr. Sharol Given and Dr. Sharol Given has had her on doxycycline for quite a bit of time here. Subsequently she is just now wrapping that  out. I think that she probably would be benefited by continue the doxycycline for a time while we can work through what we need to do with things here. She does have evidence of osteomyelitis based on what I saw on the MRI today. This obviously is unfortunate and definitely not something that she was hoping to hear. With that being said I do believe that she would potentially be a strong candidate for hyperbaric oxygen therapy. Also believe she could be a candidate for total contact cast though we have to be cautious due to the fact that how her foot is bending inward I think that this is good to be a little bit of a concern. We will definitely have to keeping a close eye on things. She does have a history of diabetes mellitus type 2. She also other than the amputation has a medical history positive for hypertension. She has never undergone hyperbaric oxygen therapy previously I did show her the chamber we did talk a little bit about it today as well. Admission [facility]: o 04/06/2021 Patient presents here in [place] today for evaluation. Again she is establishing care here after I saw her last week in [place]. Obviously I think that she is doing extremely well at this point which is great news and in general I am extremely pleased with where things stand from the standpoint of the appearance of the wound. Obviously this is not significantly smaller but does  look a lot cleaner I think using the Hydrofera Blue has been of benefit over the past week. Nonetheless she still has a wound with issues with pressure here which I think is good to be an ongoing issue. Also think that the osteomyelitis which is chronic is also getting ongoing issue. She does want to try to do what she can to prevent this from worsening and ending with a below-knee amputation. T that end I do think that getting her into the hyperbaric oxygen chamber would be what we need to do. She has recently been seen by  cardiology o and subsequently well as far as that is concerned. Her ejection fraction was appropriate for hyperbarics and everything seems to be doing great in that regard. She has not had a recent chest x-ray she is a former smoker around 15 years ago she quit. Nonetheless I do believe with her asthma we do want to do a chest Riding, Samia [name] ([id number]) [id number] Page 3 of 13 x-ray just to make sure everything is okay before proceeding with the hyperbarics although we can go ahead and see about getting the approval. I also think she is going require some sharp debridement and around this wound we also have to contact her insurance for prior approval on this as well. 04/13/2021 upon evaluation today patient appears to be doing a little bit better in regard to her leg in fact the swelling is dramatically better. Very pleased with where things stand in that regard. Fortunately there does not appear to be any signs of active infection at this time. No fevers, chills, nausea, vomiting, or diarrhea. 04/20/2021 upon evaluation today patient appears to be doing decently well in regard to her foot. There is some need for sharp debridement here today. With that being said we will get a go ahead and proceed with that today as the foot is becoming somewhat macerated with the overhanging callus that is definitely not we want to see. With that being said the patient does have an issue here as well with a boil in the perineal region unfortunately that is an issue for her today as well. I did have a look at that as well. We are still working on dealing with insurance as far as getting hyperbarics approved. 04/27/2021 upon evaluation today patient appears to be doing somewhat poorly in general compared to where she has been. At this point she is having a lot of swelling which is the main concerning thing that I am seeing. There does not appear to be any signs of infection currently which is  good news but at the same time I do feel like that she is a lot more swollen than she was even last week and is showing how much she is weeping in regard to the right lower leg. She also has a blister on the left breast although is not an open wound at this time. She continues to have the area in the perineum as well. 05/04/2021 upon evaluation today patient appears to actually be doing decently well in regard to her wound on the foot. Fortunately there is no signs of active infection at this time. No fevers, chills, nausea, vomiting, or diarrhea. Unfortunately she does have an area on the left breast which is actually appearing to be a burn based on what I see physically. She does note that she uses rice bags for areas in general and may have left one in that area not realizing it was burning her  as she does not really have much feeling. I think this is very probable based on what I am seeing. For that reason working to treat this as such I do think we need to loosen up a lot of the necrotic tissue here. I Am going tosend in a prescription for Santyl for the patient. 9/1; patient presents for HBO today but cannot do treatment due to wheezing and feeling short of breath when laying down. She was set up as a doctor visit today since she was not able to do HBO. She states she feels okay overall and started having a dry cough over the past couple days. She has not tested herself for COVID. She denies fever/chills or sputum production. She thinks her symptoms are related to allergies. She has been using silver alginate to her right plantar foot wound. She has not been using Santyl to the breast wound because she reports forgetting to do this. She uses zinc oxide to her perennial wound. She currently denies systemic signs of infection. 9/8; patient presents for follow-up. She has been a unable to do HBO treatments for the past week due to her back pain. She is currently taking Flexeril to address this issue.  She reports no issues with her compression wrap. She has been putting Santyl on the left breast wound and Monistat cream to the perennial wound. She denies signs of infection. 9/15; patient presents for follow-up. Again she is unable to do HBO treatment today due to sinus pain. She has 2 new wounds to her right foot which she is unaware of. She has been putting Santyl on the left breast wound and zinc oxide to the perineal wound. She currently denies signs of infection. Readmission 10/17/2021 Ms. Saydie Febus is a 62 year old female with a past medical history of type 2 diabetes, osteomyelitis status post right BKA and morbid obesity that presents to the clinic for multiple scattered open wounds to her body. These are located to the abdomen, left posterior leg and sacral region. She has been using mupirocin ointment, Neosporin and zinc oxide to the wound beds. She has been started on Bactrim and Keflex by her primary care physician and states she has several days left to complete the course. She currently denies systemic signs of infection. READMISSION This is a 62 year old morbidly obese type II diabetic (poorly controlled with last hemoglobin A1c 9.2%). She has congestive heart failure, peripheral vascular disease, hypertension, coronary artery disease, venous stasis, connective tissue disease (o [name]'s syndrome), and bilateral lower extremity edema. She has been seen here for various reasons in the past. She has a right above-knee amputation. She was recently hospitalized for a left diabetic foot ulcer. Orthopedic surgery was consulted. An MRI was performed that was not conclusive for osteomyelitis, but given the extent of the necrosis, orthopedics recommended amputation. Infectious disease was also consulted and have recommended a 6-week course of oral doxycycline and Augmentin. Apparently wound care was also consulted while she was in the hospital and simply recommended painting the area with  Betadine. She saw infectious disease on October 27 with plans to continue doxycycline on Augmentin to continue therapy until November 14. The infectious disease provider also recommended that the patient reconsider amputation. Her PCP referred her to the wound care center, as the patient is very reluctant to undergo amputation. She also has a small ulcer on her anterior tibial surface that recently opened after a minor trauma. On exam, her left foot is rolled such that the lateral aspect of  her foot is the contact surface. There is a large ulcer with exposed necrotic muscle and fat. The wound does not probe to bone, but apparently there was bone exposure in the past while she was in the hospital. No malodor or purulent drainage. The wound on her anterior tibial surface is small with just a little slough accumulation. She has modest edema and skin changes consistent with stasis dermatitis. 07/28/2022: The anterior tibial wound is healed. The left plantar foot wound looks a little bit better today. There is significantly less necrotic tissue present. There is an area at the most caudal aspect of the wound that looks like there is ongoing pressure-induced tissue injury. Bone remains covered. 08/11/2022: The wound has deteriorated quite a bit since her last visit. Her husband reports that she has had significantly more drainage, such that he has had to change the dressing multiple times per day. During the course of the visit, she recalled that she had not been taking her Lasix for at least 4 days. There is substantial periwound maceration and further tissue breakdown. There is necrotic tissue at the midportion of the wound and tendon is now exposed underneath this necrotic muscle. 08/17/2022: The wound looks better this week. There is still an area of necrotic muscle and tendon in the midfoot, but the forefoot has improved with some epithelium beginning to come in from the margins. Unfortunately, there is  evidence of ongoing pressure-induced tissue injury near the heel. The patient does admit to resting her heel on a stool frequently. She is still taking Bactrim as prescribed and her Keystone topical antibiotic compound should arrive at their home today. 12/14; fairly large sized wound on the left plantar foot. She has a right AKA. She uses a leg only for transfers she says she does not propel her wheelchair with that foot. She has been using Keystone, silver alginate and an Ace wrap. She has completed her Bactrim and Augmentin. She is a type II diabetic. The most worrisome part of this wound is a smaller area roughly 2 x 2 in the distal part of the wound which is 100% occupied by necrotic tendon 09/07/2022: She had an extensive debridement at her last visit. The wound is smaller today and all of the tissues appear healthier. 09/15/2022: The wound measurements are about the same, but overall the surface appears healthier. There is still a little bit of the nonviable tendon exposed at the midfoot. 10/13/2022: The patient has been ill and unable to attend clinic for almost a month. Today, the wound measures smaller and there are buds of epithelium beginning to emerge, particularly at the calcaneus. There is a bridge of skin trying to come across the midportion of the wound, as well. Minimal slough accumulation. 10/26/2022: The absolute wound measurements have not changed, but the surface is technically smaller due to a bridge of epithelium that nearly divides the wound in two. There is some eschar at the medial edge of the calcaneal aspect of the wound, underneath which there is good epithelium. There is some slough on the wound surface, predominantly at the heel. 11/14/2022: She has been absent from clinic for couple of weeks due to health issues. There is more epithelium encroaching upon the edges of the wound. She has built up some eschar around the edges. There is thin biofilm and slough on the wound  surface. 12/12/2022: The wound is narrower this week. There is some eschar and macerated callus around the edges with biofilm on the surface. There  is a bridge of skin attempting to divide the wound into 2 sections. [name], [name] ([id number]) [id number] Page 4 of 13 Electronic Signature(s) Signed: 12/12/2022 10:22:14 AM By: Fredirick Maudlin MD FACS Entered By: Fredirick Maudlin on 12/12/2022 10:22:14 -------------------------------------------------------------------------------- Physical Exam Details Patient Name: Date of Service: [name], [name] [name] 12/12/2022 10:00 A [name] Medical Record Number: [id number] Patient Account Number: [id number] Date of Birth/Sex: Treating RN: March 31, 1961 (62 y.o. F) Primary Care Provider: [name] Other Clinician: Referring Provider: Treating Provider/Extender: [name] in Treatment: 20 Constitutional Hypertensive, asymptomatic. . . . no acute distress. Respiratory Normal work of breathing on room air. Notes 12/12/2022: The wound is narrower this week. There is some eschar and macerated callus around the edges with biofilm on the surface. There is a bridge of skin attempting to divide the wound into 2 sections. Electronic Signature(s) Signed: 12/12/2022 10:22:45 AM By: Fredirick Maudlin MD FACS Entered By: Fredirick Maudlin on 12/12/2022 10:22:45 -------------------------------------------------------------------------------- Physician Orders Details Patient Name: Date of Service: [name], [name] [name] 12/12/2022 10:00 A [name] Medical Record Number: [id number] Patient Account Number: [id number] Date of Birth/Sex: Treating RN: 1961-08-20 (62 y.o. 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 - Dr. Celine Ahr - room 3 1015 12/26/2022 Tuesday Return A Other: - over the counter antifungal spray/powder for skin folds. Anesthetic (In clinic) Topical Lidocaine 5% applied to wound bed Bathing/ Shower/ Hygiene May shower and wash wound with soap and water. - with dressing changes Edema Control - Lymphedema / SCD / Other Elevate legs to the level of the heart or above for 30 minutes daily and/or when sitting for 3-4 times a day throughout the day. Avoid standing for long periods of time. Patient to wear own compression stockings every day. [name], [name] ([id number]) [id number] Page 5 of 13 Off-Loading Other: - Try and elevate Left leg throughout the day Wound Treatment Wound #19 - Foot Wound Laterality: Plantar, Left Cleanser: Soap and Water 1 x Per Day/30 Days Discharge Instructions: May shower and wash wound with dial antibacterial soap and water prior to dressing change. Cleanser: Wound Cleanser (Generic) 1 x Per Day/30 Days Discharge Instructions: Cleanse the wound with wound cleanser prior to applying a clean dressing using gauze sponges, not tissue or cotton balls. Prim Dressing: Maxorb Extra Ag+ Alginate Dressing, 4x4.75 (in/in) (DME) (Generic) 1 x Per Day/30 Days ary Discharge Instructions: Apply to wound bed as instructed Secondary Dressing: ABD Pad, 5x9 (DME) (Generic) 1 x Per Day/30 Days Discharge Instructions: Apply over primary dressing as directed. Secondary Dressing: Woven Gauze  Sponge, Non-Sterile 4x4 in (DME) (Generic) 1 x Per Day/30 Days Discharge Instructions: Apply over primary dressing as directed. Secured With: Elastic Bandage 4 inch (ACE bandage) (DME) (Generic) 1 x Per Day/30 Days Discharge Instructions: Secure with ACE bandage as directed. Secured With: [employer], 4.5x3.1 (in/yd) (DME) (Generic) 1 x Per Day/30 Days Discharge Instructions: Secure with Kerlix as directed. Secured With: 77M Medipore Public affairs consultant Surgical T 2x10 (in/yd) (DME) (Generic)  1 x Per Day/30 Days ape Discharge Instructions: Secure with tape as directed. Electronic Signature(s) Signed: 12/12/2022 12:34:56 PM By: Fredirick Maudlin MD FACS Entered By: Fredirick Maudlin on 12/12/2022 10:23:48 -------------------------------------------------------------------------------- Problem List Details Patient Name: Date of Service: [name], [name] [name] 12/12/2022 10:00 A [name] Medical Record Number: [id number] Patient Account Number: [id number] Date of Birth/Sex: Treating RN: [birth year] (62 y.o. 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, unspecified 07/19/2022 No Yes I73.9 Peripheral vascular disease, unspecified 07/19/2022 No Yes Lins, Geraline [name] ([id number]) [id number] Page 6 of 13 [id number] Chronic diastolic (congestive) heart failure 07/19/2022 No Yes 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 12/12/2022 10:00 A [name] Medical Record Number: [id number] Patient Account Number: [id number] Date of Birth/Sex: Treating RN: Jul 12, 1961 (62 y.o. F) Primary Care Provider: [name] Other Clinician: Referring Provider: Treating Provider/Extender: [name] in Treatment: 20 Subjective Chief Complaint Information obtained from Patient 10/17/2021; multiple scattered wounds to the abdomen, posterior left leg, left labia and sacrum 07/19/2022: left foot DFU History of Present Illness (HPI) 07/23/2019 on evaluation today patient presents with a myriad of wounds noted at multiple locations over her right heel, right lower extremity, and abdominal region. She also has bilateral lower extremity lymphedema which is quite significant as well. Incidentally she also has congestive heart failure and hypertension. She also is obese. With that being said I think all this is contributing as well to her lower extremity edema which is very much uncontrolled. It seems like its been at least several months since she is worn any compression according to what she tells me. With that being said I am not sure exactly when that would have been. She does have fibrotic changes in the lower extremity secondary to lymphedema worse on the left than the right. She did show me pictures of wound she had on the anterior portion of her shin which was quite significant fortunately that has healed. These issues have been intermittent at this time.  Again I think that with appropriate compression therapy she may actually be doing better than what we are seeing at this point but again I am not really sure that she is ever been extremely compliant with that. Fortunately there is no signs of active infection at this time. No fever chills noted. As far as the abdominal ulcer she initially had one wound that she thinks may have been a bug bite. Subsequently she was put a dressing on this and she states that the tape pulled skin off on other locations causing other wounds that have not healed that is been about 3 months. The wounds on her lower extremities have been intermittent over 3 years. This includes the heel. 11/19; this is a  patient that I have not seen previously. She was admitted to our clinic last week with multiple wounds including several superficial circular areas on her abdomen predominantly right upper quadrant. Also 1 in her umbilicus. She has an area on her right lateral malleolus which was new today. She has bilateral lower extremity edema with very significant stasis dermatitis on the left anterior tibial area. She has tightly adherent skin in her lower extremities probably secondary to cutaneous fibrosis in the area. We put her in compression last week. She comes in with the dressings reasonably saturated. 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 She has multiple areas on her arms from scratching. 12/3; the patient I saw for the first time 2 weeks ago. She has 3 small circular areas on her abdomen 2 in the right upper quadrant one on her umbilicus. We have been using silver alginate to this area. She also had an area on her right lateral malleolus and right heel and right medial malleolus. We have been using silver alginate here under  compression. She does not have an open area on the left leg today. We are going to order her stockings for the left leg. She clearly has chronic lymphedema in these areas. X-ray of the foot from 10/20 did not show any fracture or dislocation. The heel wound was noted there was no evidence of osteomyelitis She complains of generalized pruritus. 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 She has longstanding lymphedema. Although she does not have known liver disease I looked at his CT scan of the abdomen from February of this year that showed hepatic steatosis 12/11; patient still has no open area on the left leg we transitioned her into a stocking she had. Her stockings from [employer] are supposed to be arriving later today which will be the stockings of choice. The only wound remaining on the right is the right heel 2 small superficial open areas. Everything else is well on its way to healing in the right leg few small excoriations. She has 1 major area remaining on her abdomen the rest seem to be healing 12/22 patient still has no open area on the left leg and she is still in a stocking. She had still 2 open areas on the tip of her right heel. These look like pressure related areas although the patient is not really certain how this is happening. She has an open heeled [name] that we provided. Still has 1 open area on the right lateral abdomen The patient has complaints of generalized pruritus. She has multiple excoriated areas on her abdomen that of close over her arms or thighs which I think are from scratching. She tells me that she has never had a basic work-up for this which might include basic lab work, liver functions, kidney functions, thyroid etc. She might also benefit from a dermatologist 12/29; the patient has a deeper larger more painful wound on the tip of her right heel. Again these look like pressure ulcers although she wears an open toed [name],  [name] ([id number]) [id number] Page 7 of 13 heel pads or heel at night to prevent it from hitting the mattress etc. They tell me and remind me that this is been present on and off for about 2 years that has closed over but then will reopen.  I did look over her lab work that was available in [place]. She does not have elevated liver function tests or creatinine. I was not able to see a TSH. This was in response to her complaints of generalized itching and a itch/scratch cycle. I also noted that she is on OxyContin and oxycodone and of course narcotics can cause generalized pruritus as a side effect Readmission: History From [place], [place] Last Week: 03/29/2021 this is a patient who presents for initial evaluation here in the clinic though I have seen her 1 time previously in 2020 this was in [place]. That was actually her initial visit therefore a heel ulceration. Subsequently that did not healing she actually saw me the 1 time in November and then subsequently saw Dr. Dellia Nims through the end of December where she apparently was healed. 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 This is on the right. With that being said right now she has a basically region on the lateral portion of the ray site where she is applying more pressure especially as her foot seems to be starting to turn in and this has become an increasingly significant issue for her to be honest. She does see Dr. Sharol Given and Dr. Sharol Given has had her on doxycycline for quite a bit of time here. Subsequently she is just now wrapping that out. I think that she probably would be benefited by continue the doxycycline for a time while we can work through what we need to do with things here. She does have evidence of osteomyelitis based on what I saw on the MRI today. This obviously is unfortunate and  definitely not something that she was hoping to hear. With that being said I do believe that she would potentially be a strong candidate for hyperbaric oxygen therapy. Also believe she could be a candidate for total contact cast though we have to be cautious due to the fact that how her foot is bending inward I think that this is good to be a little bit of a concern. We will definitely have to keeping a close eye on things. She does have a history of diabetes mellitus type 2. She also other than the amputation has a medical history positive for hypertension. She has never undergone hyperbaric oxygen therapy previously I did show her the chamber we did talk a little bit about it today as well. Admission [facility]: o 04/06/2021 Patient presents here in [place] today for evaluation. Again she is establishing care here after I saw her last week in [place]. Obviously I think that she is doing extremely well at this point which is great news and in general I am extremely pleased with where things stand from the standpoint of the appearance of the wound. Obviously this is not significantly smaller but does look a lot cleaner I think using the Hydrofera Blue has been of benefit over the past week. Nonetheless she still has a wound with issues with pressure here which I think is good to be an ongoing issue. Also think that the osteomyelitis which is chronic is also getting ongoing issue. She does want to try to do what she can to prevent this from worsening and ending with a below-knee amputation. T that end I do think that getting her into the hyperbaric oxygen chamber would be what we need to do. She has recently  been seen by cardiology o and subsequently well as far as that is concerned. Her ejection fraction was appropriate for hyperbarics and everything seems to be doing great in that regard. She has not had a recent chest x-ray she is a former smoker around 15 years ago she quit.  Nonetheless I do believe with her asthma we do want to do a chest x-ray just to make sure everything is okay before proceeding with the hyperbarics although we can go ahead and see about getting the approval. I also think she is going require some sharp debridement and around this wound we also have to contact her insurance for prior approval on this as well. 04/13/2021 upon evaluation today patient appears to be doing a little bit better in regard to her leg in fact the swelling is dramatically better. Very pleased with where things stand in that regard. Fortunately there does not appear to be any signs of active infection at this time. No fevers, chills, nausea, vomiting, or diarrhea. 04/20/2021 upon evaluation today patient appears to be doing decently well in regard to her foot. There is some need for sharp debridement here today. With that being said we will get a go ahead and proceed with that today as the foot is becoming somewhat macerated with the overhanging callus that is definitely not we want to see. With that being said the patient does have an issue here as well with a boil in the perineal region unfortunately that is an issue for her today as well. I did have a look at that as well. We are still working on dealing with insurance as far as getting hyperbarics approved. 04/27/2021 upon evaluation today patient appears to be doing somewhat poorly in general compared to where she has been. At this point she is having a lot of swelling which is the main concerning thing that I am seeing. There does not appear to be any signs of infection currently which is good news but at the same time I do feel like that she is a lot more swollen than she was even last week and is showing how much she is weeping in regard to the right lower leg. She also has a blister on the left breast although is not an open wound at this time. She continues to have the area in the perineum as well. 05/04/2021 upon evaluation  today patient appears to actually be doing decently well in regard to her wound on the foot. Fortunately there is no signs of active infection at this time. No fevers, chills, nausea, vomiting, or diarrhea. Unfortunately she does have an area on the left breast which is actually appearing to be a burn based on what I see physically. She does note that she uses rice bags for areas in general and may have left one in that area not realizing it was burning her as she does not really have much feeling. I think this is very probable based on what I am seeing. For that reason working to treat this as such I do think we need to loosen up a lot of the necrotic tissue here. I Am going tosend in a prescription for Santyl for the patient. 9/1; patient presents for HBO today but cannot do treatment due to wheezing and feeling short of breath when laying down. She was set up as a doctor visit today since she was not able to do HBO. She states she feels okay overall and started having a dry cough  over the past couple days. She has not tested herself for COVID. She denies fever/chills or sputum production. She thinks her symptoms are related to allergies. She has been using silver alginate to her right plantar foot wound. She has not been using Santyl to the breast wound because she reports forgetting to do this. She uses zinc oxide to her perennial wound. She currently denies systemic signs of infection. 9/8; patient presents for follow-up. She has been a unable to do HBO treatments for the past week due to her back pain. She is currently taking Flexeril to address this issue. She reports no issues with her compression wrap. She has been putting Santyl on the left breast wound and Monistat cream to the perennial wound. She denies signs of infection. 9/15; patient presents for follow-up. Again she is unable to do HBO treatment today due to sinus pain. She has 2 new wounds to her right foot which she is unaware of.  She has been putting Santyl on the left breast wound and zinc oxide to the perineal wound. She currently denies signs of infection. Readmission 10/17/2021 Ms. Ines Toennies is a 62 year old female with a past medical history of type 2 diabetes, osteomyelitis status post right BKA and morbid obesity that presents to the clinic for multiple scattered open wounds to her body. These are located to the abdomen, left posterior leg and sacral region. She has been using mupirocin ointment, Neosporin and zinc oxide to the wound beds. She has been started on Bactrim and Keflex by her primary care physician and states she has several days left to complete the course. She currently denies systemic signs of infection. READMISSION This is a 62 year old morbidly obese type II diabetic (poorly controlled with last hemoglobin A1c 9.2%). She has congestive heart failure, peripheral vascular disease, hypertension, coronary artery disease, venous stasis, connective tissue disease (o [name]'s syndrome), and bilateral lower extremity edema. She has been seen here for various reasons in the past. She has a right above-knee amputation. She was recently hospitalized for a left diabetic foot ulcer. Orthopedic surgery was consulted. An MRI was performed that was not conclusive for osteomyelitis, but given the extent of the necrosis, orthopedics recommended amputation. Infectious disease was also consulted and have recommended a 6-week course of oral doxycycline and Augmentin. Apparently wound care was also consulted while she was in the hospital and simply recommended painting the area with Betadine. She saw infectious disease on October 27 with plans to continue doxycycline on Augmentin to continue therapy until November 14. The infectious disease provider also recommended that the patient reconsider amputation. Her PCP referred her to the wound care center, as the patient is very reluctant to undergo amputation. She also has a  small ulcer on her anterior tibial surface that recently opened after a minor trauma. On exam, her left foot is rolled such that the lateral aspect of her foot is the contact surface. There is a large ulcer with exposed necrotic muscle and fat. The wound does not probe to bone, but apparently there was bone exposure in the past while she was in the hospital. No malodor or purulent drainage. The wound on her anterior tibial surface is small with just a little slough accumulation. She has modest edema and skin changes consistent with stasis dermatitis. 07/28/2022: The anterior tibial wound is healed. The left plantar foot wound looks a little bit better today. There is significantly less necrotic tissue present. There [name], Tiombe [name] ([id number]) [id number] Page 8 of 13 is  an area at the most caudal aspect of the wound that looks like there is ongoing pressure-induced tissue injury. Bone remains covered. 08/11/2022: The wound has deteriorated quite a bit since her last visit. Her husband reports that she has had significantly more drainage, such that he has had to change the dressing multiple times per day. During the course of the visit, she recalled that she had not been taking her Lasix for at least 4 days. There is substantial periwound maceration and further tissue breakdown. There is necrotic tissue at the midportion of the wound and tendon is now exposed underneath this necrotic muscle. 08/17/2022: The wound looks better this week. There is still an area of necrotic muscle and tendon in the midfoot, but the forefoot has improved with some epithelium beginning to come in from the margins. Unfortunately, there is evidence of ongoing pressure-induced tissue injury near the heel. The patient does admit to resting her heel on a stool frequently. She is still taking Bactrim as prescribed and her Keystone topical antibiotic compound should arrive at their home today. 12/14;  fairly large sized wound on the left plantar foot. She has a right AKA. She uses a leg only for transfers she says she does not propel her wheelchair with that foot. She has been using Keystone, silver alginate and an Ace wrap. She has completed her Bactrim and Augmentin. She is a type II diabetic. The most worrisome part of this wound is a smaller area roughly 2 x 2 in the distal part of the wound which is 100% occupied by necrotic tendon 09/07/2022: She had an extensive debridement at her last visit. The wound is smaller today and all of the tissues appear healthier. 09/15/2022: The wound measurements are about the same, but overall the surface appears healthier. There is still a little bit of the nonviable tendon exposed at the midfoot. 10/13/2022: The patient has been ill and unable to attend clinic for almost a month. Today, the wound measures smaller and there are buds of epithelium beginning to emerge, particularly at the calcaneus. There is a bridge of skin trying to come across the midportion of the wound, as well. Minimal slough accumulation. 10/26/2022: The absolute wound measurements have not changed, but the surface is technically smaller due to a bridge of epithelium that nearly divides the wound in two. There is some eschar at the medial edge of the calcaneal aspect of the wound, underneath which there is good epithelium. There is some slough on the wound surface, predominantly at the heel. 11/14/2022: She has been absent from clinic for couple of weeks due to health issues. There is more epithelium encroaching upon the edges of the wound. She has built up some eschar around the edges. There is thin biofilm and slough on the wound surface. 12/12/2022: The wound is narrower this week. There is some eschar and macerated callus around the edges with biofilm on the surface. There is a bridge of skin attempting to divide the wound into 2 sections. Patient History Information obtained from  Patient. Family History Diabetes - Mother, Heart Disease - Mother,Father,Siblings, Hypertension - Mother,Father,Siblings, Lung Disease - Father, Stroke - Mother, No family history of Cancer, Hereditary Spherocytosis, Kidney Disease, Seizures, Thyroid Problems, Tuberculosis. Social History Former smoker - quit 15 years ago, Marital Status - Married, Alcohol Use - Never, Drug Use - No History, Caffeine Use - Rarely. Medical History Eyes Patient has history of Cataracts - both eyes Denies history of Glaucoma, Optic Neuritis Ear/Nose/Mouth/Throat Denies history  of Chronic sinus problems/congestion, Middle ear problems Hematologic/Lymphatic Patient has history of Lymphedema Denies history of Anemia, Hemophilia, Human Immunodeficiency Virus, Sickle Cell Disease Respiratory Patient has history of Asthma Denies history of Aspiration, Chronic Obstructive Pulmonary Disease (COPD), Pneumothorax, Sleep Apnea, Tuberculosis Cardiovascular Patient has history of Congestive Heart Failure, Coronary Artery Disease, Hypertension, Peripheral Arterial Disease, Peripheral Venous Disease Denies history of Angina, Arrhythmia, Hypotension, Myocardial Infarction, Phlebitis, Vasculitis Gastrointestinal Denies history of Hepatitis A, Hepatitis B, Hepatitis [name] Endocrine Patient has history of Type II Diabetes Denies history of Type I Diabetes Genitourinary Denies history of End Stage Renal Disease Immunological Denies history of Lupus Erythematosus, Raynaudoos, Scleroderma Integumentary (Skin) Denies history of History of Burn Musculoskeletal Patient has history of Rheumatoid Arthritis Denies history of Gout, Osteoarthritis, Osteomyelitis Neurologic Patient has history of Neuropathy Denies history of Dementia, Quadriplegia, Paraplegia, Seizure Disorder Oncologic Denies history of Received Chemotherapy, Received Radiation Psychiatric Denies history of Anorexia/bulimia, Confinement  Anxiety Hospitalization/Surgery History - 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 Sharol Given 01/2020. 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 - 11/22 R AKA. 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 no acute distress. Vitals Time Taken: 10:06 AM, Height: 62 in, Weight: 284 lbs, BMI: 51.9, Temperature: 98.5 F, Pulse: 83 bpm, Respiratory Rate: 20 breaths/min, Blood Pressure: 166/67 mmHg, Capillary Blood Glucose: 148 mg/dl. Respiratory Normal work of breathing on room air. General Notes: 12/12/2022: The wound is narrower this week. There is some eschar and macerated callus around the edges with biofilm on the surface. There is a bridge of skin attempting to divide the wound into 2 sections. Integumentary (Hair, Skin) Wound #19 status is Open. Original cause of wound was Gradually Appeared. The date acquired was: 05/17/2022. The wound has been in treatment 20 weeks. The wound is located on the [place]. The wound measures 10.1cm length x 3.3cm width x 0.2cm depth; 26.177cm^2 area and 5.235cm^3 volume. There is Fat Layer (Subcutaneous Tissue) exposed. There is no tunneling or undermining noted. There is a medium amount of serosanguineous drainage noted. The wound margin is distinct with the outline attached to the wound base. There is large (67-100%) pink granulation within the wound bed. There is a small (1- 33%) amount of necrotic tissue within the wound bed including Adherent Slough. The periwound skin appearance had no abnormalities noted for moisture. The periwound skin appearance exhibited: Callus. The  periwound skin appearance did not exhibit: Crepitus, Excoriation, Induration, Rash, Scarring, Atrophie Blanche, Cyanosis, Ecchymosis, Hemosiderin Staining, Mottled, Pallor, Rubor, Erythema. Periwound temperature was noted as No Abnormality. 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 There was a Selective/Open Wound Skin/Epidermis Debridement with a total area of 36.05 sq cm performed by Fredirick Maudlin, MD. With the following instrument(s): Curette to remove Viable and Non-Viable tissue/material. Material removed includes Eschar, Skin: Dermis, Skin: Epidermis, and Biofilm after achieving pain control using Lidocaine  4% Topical Solution. A time out was conducted at 10:10, prior to the start of the procedure. A Minimum amount of bleeding was controlled with Pressure. The procedure was tolerated well with a pain level of 0 throughout and a pain level of 0 following the procedure. Post Debridement Measurements: 10.1cm length x 3.3cm width x 0.2cm depth; 5.235cm^3 volume. Character of Wound/Ulcer Post Debridement is improved. Severity of Tissue Post Debridement is: Fat layer exposed. Post procedure Diagnosis Wound #19: Same as Pre-Procedure Plan Follow-up Appointments: Return Appointment in 2 weeks. - Dr. Celine Ahr - room 3 1015 12/26/2022 Tuesday Other: - over the counter antifungal spray/powder for skin folds. Anesthetic: (In clinic) Topical Lidocaine 5% applied to  wound bed Bos, Makaia [name] ([id number]) [id number] Page 10 of 13 Bathing/ Shower/ Hygiene: May shower and wash wound with soap and water. - with dressing changes Edema Control - Lymphedema / SCD / Other: Elevate legs to the level of the heart or above for 30 minutes daily and/or when sitting for 3-4 times a day throughout the day. Avoid standing for long periods of time. Patient to wear own compression stockings every day. Off-Loading: Other: - Try and elevate Left leg throughout the day WOUND #19: - Foot Wound Laterality: Plantar, Left Cleanser: Soap and Water 1 x Per Day/30 Days Discharge Instructions: May shower and wash wound with dial antibacterial soap and water prior to dressing change. Cleanser: Wound Cleanser (Generic) 1 x Per Day/30 Days Discharge Instructions: Cleanse the wound with wound cleanser prior to applying a clean dressing using gauze sponges, not tissue or cotton balls. Prim Dressing: Maxorb Extra Ag+ Alginate Dressing, 4x4.75 (in/in) (DME) (Generic) 1 x Per Day/30 Days ary Discharge Instructions: Apply to wound bed as instructed Secondary Dressing: ABD Pad, 5x9 (DME) (Generic) 1 x Per Day/30 Days Discharge Instructions: Apply over primary dressing as directed. Secondary Dressing: Woven Gauze Sponge, Non-Sterile 4x4 in (DME) (Generic) 1 x Per Day/30 Days Discharge Instructions: Apply over primary dressing as directed. Secured With: Elastic Bandage 4 inch (ACE bandage) (DME) (Generic) 1 x Per Day/30 Days Discharge Instructions: Secure with ACE bandage as directed. Secured With: [employer], 4.5x3.1 (in/yd) (DME) (Generic) 1 x Per Day/30 Days Discharge Instructions: Secure with Kerlix as directed. Secured With: 87M Medipore Public affairs consultant Surgical T 2x10 (in/yd) (DME) (Generic) 1 x Per Day/30 Days ape Discharge Instructions: Secure with tape as directed. 12/12/2022: The wound is narrower this week. There is some eschar and macerated callus  around the edges with biofilm on the surface. There is a bridge of skin attempting to divide the wound into 2 sections. I used a curette to debride biofilm, eschar, and callus from her wound. We will continue to use her Keystone topical antibiotic compound (prescription drug), with silver alginate, Kerlix, and Ace bandage. Follow-up in 2 weeks. Electronic Signature(s) Signed: 12/12/2022 10:24:31 AM By: Fredirick Maudlin MD FACS Entered By: Fredirick Maudlin on 12/12/2022 10:24:31 -------------------------------------------------------------------------------- HxROS Details Patient Name: Date of Service: [place], [name] [name] 12/12/2022 10:00 A [name] Medical Record Number: [id number] Patient Account Number: [id number] Date of Birth/Sex: Treating RN: 1961/06/14 (62 y.o. F) Primary Care Provider: [name] Other Clinician: Referring Provider: Treating Provider/Extender: [name] in Treatment: 20 Information Obtained From Patient Constitutional Symptoms (General Health) Medical History: Past Medical History Notes: hypothyroidism Eyes Medical History: Positive for: Cataracts - both eyes Negative for: Glaucoma; Optic Neuritis Ear/Nose/Mouth/Throat Medical History: Negative for: Chronic sinus problems/congestion; Middle ear problems Hematologic/Lymphatic Medical History: Positive for: Lymphedema Negative for: Anemia;  Hemophilia; Human Immunodeficiency Virus; Sickle Cell Disease Respiratory Medical History: Linzy, Jancie [name] ([id number]) [id number] Page 11 of 13 Positive for: Asthma Negative for: Aspiration; Chronic Obstructive Pulmonary Disease (COPD); Pneumothorax; Sleep Apnea; Tuberculosis Cardiovascular Medical History: Positive for: Congestive Heart Failure; Coronary Artery Disease; Hypertension; Peripheral Arterial Disease; Peripheral Venous Disease Negative for: Angina; Arrhythmia; Hypotension; Myocardial Infarction;  Phlebitis; Vasculitis Past Medical History Notes: cardiac stents Gastrointestinal Medical History: Negative for: Hepatitis A; Hepatitis B; Hepatitis [name] Past Medical History Notes: GERD Endocrine Medical History: Positive for: Type II Diabetes Negative for: Type I Diabetes Past Medical History Notes: Hypothyroidism Time with diabetes: since 2002 Treated with: Insulin, Oral agents Blood sugar tested every day: Yes Tested : 2-3 times a day Genitourinary Medical History: Negative for: End Stage Renal Disease Immunological Medical History: Negative for: Lupus Erythematosus; Raynauds; Scleroderma 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 Integumentary (Skin) Medical History: Negative for: History of Burn Musculoskeletal Medical History: Positive for: Rheumatoid Arthritis Negative for: Gout; Osteoarthritis; Osteomyelitis Past Medical History Notes: Fibromyalgia, Scoliosis, Neurologic Medical History: Positive for: Neuropathy Negative for: Dementia; Quadriplegia; Paraplegia; Seizure Disorder Oncologic Medical History: Negative for: Received Chemotherapy; Received Radiation Psychiatric Medical History: Negative for: Anorexia/bulimia; Confinement Anxiety HBO Extended History Items Eyes: Cataracts Immunizations Pneumococcal Vaccine: Received Pneumococcal Vaccination: Yes Received Pneumococcal Vaccination On or After 60th Birthday: Yes Arambula, Tempie [name] ([id number]) [id number] Page 12 of 13 Implantable Devices None Hospitalization / Surgery History Type of Hospitalization/Surgery 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 Family and Social History Cancer: No; Diabetes: Yes - Mother; Heart Disease: Yes - Mother,Father,Siblings; Hereditary Spherocytosis: No; Hypertension: Yes - Mother,Father,Siblings; Kidney Disease: No; Lung Disease:  Yes - Father; Seizures: No; Stroke: Yes - Mother; Thyroid Problems: No; Tuberculosis: No; Former smoker - quit 15 years ago; Marital Status - Married; Alcohol Use: Never; Drug Use: No History; Caffeine Use: Rarely; Financial Concerns: No; Food, Clothing or Shelter Needs: No; Support System Lacking: No; Transportation Concerns: No Electronic Signature(s) Signed: 12/12/2022 12:34:56 PM By: Fredirick Maudlin MD FACS Entered By: Fredirick Maudlin on 12/12/2022 10:22:22 -------------------------------------------------------------------------------- SuperBill Details Patient Name: Date of Service: Kouns, [name] [name] 12/12/2022 Medical Record Number: [id number] Patient Account Number: [id number] Date of Birth/Sex: Treating RN: [birth year] (62 y.o. 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 M86.9 Osteomyelitis, unspecified E11.621 Type 2 diabetes mellitus with foot ulcer E66.01 Morbid (severe) obesity due to excess calories M35.00 [name] syndrome, unspecified I73.9 Peripheral vascular disease, unspecified [id number] Chronic diastolic (congestive) heart failure 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 Facility Procedures : 7 CPT4 Code: [id number] Description: 97597 - DEBRIDE WOUND 1ST 20 SQ CM OR < ICD-10 Diagnosis Description 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 Modifier: Quantity: 1 : 7 CPT4 Code: [id number] Description: [id number] - DEBRIDE WOUND EA ADDL 20 SQ CM ICD-10 Diagnosis Description 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 Modifier: Quantity: 1 Physician Procedures : CPT4 Code Description Modifier [id number] - WC PHYS LEVEL 4 -  EST PT 25 ICD-10 Diagnosis Description 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

## 2022-12-12 NOTE — Telephone Encounter (Signed)
Oral swab drug screen was consistent for prescribed medications.  ?

## 2022-12-12 NOTE — Progress Notes (Signed)
[name], [name] ([id number]) [id number] Page 1 of 7 Visit Report for 12/12/2022 Arrival Information Details Patient Name: Date of Service: [name], [name] 12/12/2022 10:00 A [name] Medical Record Number: [id number] Patient Account Number: [id number] Date of Birth/Sex: Treating RN: 11/24/1960 (62 y.o. [name], [name] Primary Care [name]: [name] Other Clinician: Referring [name]: Treating [name]/Extender: [name] in Treatment: 24 Visit Information History Since Last Visit Added or deleted any medications: No Patient Arrived: Wheel Chair Any new allergies or adverse reactions: No Arrival Time: 10:02 Had a fall or experienced change in No Accompanied By: husband activities of daily living that may affect Transfer Assistance: Manual risk of falls: Patient Identification Verified: Yes Signs or symptoms of abuse/neglect since last visito No Secondary Verification Process Completed: Yes Hospitalized since last visit: No Patient Requires Transmission-Based Precautions: No Implantable device outside of the clinic excluding No Patient Has Alerts: No cellular tissue based products placed in the center since last visit: Has Dressing in Place as Prescribed: Yes Pain Present Now: Yes Electronic Signature(s) Signed: 12/12/2022 4:09:50 PM By: [name] Pilling RN, BSN Entered By: [name] on 12/12/2022 10:05:59 -------------------------------------------------------------------------------- Encounter Discharge Information Details Patient Name: Date of Service: [name], [name] 12/12/2022 10:00 A [name] Medical Record Number: [id number] Patient Account Number: [id number] Date of Birth/Sex: Treating RN: 1960/10/29 (62 y.o. [name], [name] Primary Care [name]: [name] Other Clinician: Referring [name]: Treating [name]/Extender: [name] in Treatment: 20 Encounter Discharge  Information Items Post Procedure Vitals Discharge Condition: Stable Temperature (F): 98.5 Ambulatory Status: Wheelchair Pulse (bpm): 83 Discharge Destination: Home Respiratory Rate (breaths/min): 20 Transportation: Private Auto Blood Pressure (mmHg): 166/67 Accompanied By: husband Schedule Follow-up Appointment: Yes Clinical Summary of Care: Electronic Signature(s) Signed: 12/12/2022 4:09:50 PM By: [name] Pilling RN, BSN Entered By: [name] on 12/12/2022 10:23:15 -------------------------------------------------------------------------------- Lower Extremity Assessment Details Patient Name: Date of Service: [name], [name] 12/12/2022 10:00 A [name] Medical Record Number: [id number] Patient Account Number: [id number] Date of Birth/Sex: Treating RN: Dec 14, 1960 (62 y.o. [name] Primary Care [name]: [name] Other Clinician: Referring Carrissa Taitano: Treating [name]/Extender: [name] in Treatment: 20 Edema Assessment Assessed: [name]: No] [Right: No] R[LeftCHRISANNE, [name] [id number] [Right: [id number] Page 2 of 7] Edema: [Left: Ye] [Right: s] Calf Left: Right: Point of Measurement: 30 cm From Medial Instep 48 cm Ankle Left: Right: Point of Measurement: 9 cm From Medial Instep 26 cm Vascular Assessment Pulses: Dorsalis Pedis Palpable: [Left:Yes] Electronic Signature(s) Signed: 12/12/2022 4:09:50 PM By: [name] Pilling RN, BSN Entered By: [name] on 12/12/2022 10:06:49 -------------------------------------------------------------------------------- Multi Wound Chart Details Patient Name: Date of Service: [name], [name] 12/12/2022 10:00 A [name] Medical Record Number: [id number] Patient Account Number: [id number] Date of Birth/Sex: Treating RN: 1961/02/21 (62 y.o. F) Primary Care [name]: [name] Other Clinician: Referring [name]: Treating [name]/Extender: [name] in Treatment: 20 Vital Signs Height(in): 62 Capillary Blood Glucose(mg/dl): 148 Weight(lbs): 284 Pulse(bpm): 97 Body Mass Index(BMI): 51.9 Blood Pressure(mmHg): 166/67 Temperature(F): 98.5 Respiratory Rate(breaths/min): 20 [19:Photos:] [N/A:N/A] Left, Plantar Foot N/A N/A Wound Location: Gradually Appeared N/A N/A Wounding Event: Diabetic Wound/Ulcer of the Lower N/A N/A Primary Etiology: Extremity Cataracts, Lymphedema, Asthma, N/A N/A Comorbid History: Congestive Heart Failure, Coronary Artery Disease, Hypertension, Peripheral Arterial Disease, Peripheral Venous Disease, Type II Diabetes, Rheumatoid Arthritis, Neuropathy 05/17/2022 N/A N/A Date Acquired: 20 N/A N/A Weeks of Treatment: Open N/A N/A Wound Status: No N/A N/A Wound Recurrence: 10.1x3.3x0.2 N/A N/A  Measurements L x W x D (cm) 26.177 N/A N/A A (cm) : [name] 5.235 N/A N/A Volume (cm) : -16.50% N/A N/A % Reduction in A [name]: 41.70% N/A N/A % Reduction in Volume: Grade 2 N/A N/A Classification: Medium N/A N/A Exudate A mount: Serosanguineous N/A N/A Exudate Type: red, brown N/A N/A Exudate Color: Distinct, outline attached N/A N/A Wound Margin: Large (67-100%) N/A N/A Granulation A mount: [name] ([id number]) [id number] Page 3 of 7 Pink N/A N/A Granulation Quality: Small (1-33%) N/A N/A Necrotic Amount: Fat Layer (Subcutaneous Tissue): Yes N/A N/A Exposed Structures: Fascia: No Tendon: No Muscle: No Joint: No Bone: No Medium (34-66%) N/A N/A Epithelialization: Debridement - Selective/Open Wound N/A N/A Debridement: Pre-procedure Verification/Time Out 10:10 N/A N/A Taken: Lidocaine 4% Topical Solution N/A N/A Pain Control: Necrotic/Eschar N/A N/A Tissue Debrided: Skin/Epidermis N/A N/A Level: 36.05 N/A N/A Debridement A (sq cm): [name] Curette N/A N/A Instrument: Minimum N/A N/A Bleeding: Pressure N/A  N/A Hemostasis A chieved: 0 N/A N/A Procedural Pain: 0 N/A N/A Post Procedural Pain: Procedure was tolerated well N/A N/A Debridement Treatment Response: 10.1x3.3x0.2 N/A N/A Post Debridement Measurements L x W x D (cm) 5.235 N/A N/A Post Debridement Volume: (cm) Callus: Yes N/A N/A Periwound Skin Texture: Excoriation: No Induration: No Crepitus: No Rash: No Scarring: No Dry/Scaly: Yes N/A N/A Periwound Skin Moisture: Maceration: No Atrophie Blanche: No N/A N/A Periwound Skin Color: Cyanosis: No Ecchymosis: No Erythema: No Hemosiderin Staining: No Mottled: No Pallor: No Rubor: No No Abnormality N/A N/A Temperature: Debridement N/A N/A Procedures Performed: Treatment Notes Electronic Signature(s) Signed: 12/12/2022 10:21:29 AM By: Fredirick Maudlin MD FACS Entered By: Fredirick Maudlin on 12/12/2022 10:21:28 -------------------------------------------------------------------------------- Multi-Disciplinary Care Plan Details Patient Name: Date of Service: Zollars, [name] 12/12/2022 10:00 A [name] Medical Record Number: [id number] Patient Account Number: [id number] Date of Birth/Sex: Treating RN: 11/26/1960 (62 y.o. [name] Primary Care Yamilette Garretson: [name] Other Clinician: Referring Jerrold Haskell: Treating Roosevelt Eimers/Extender: [name] in Treatment: [place] reviewed with physician Active Inactive Venous Leg Ulcer Nursing Diagnoses: Potential for venous Insuffiency (use before diagnosis confirmed) Goals: Patient will maintain optimal edema control Date Initiated: 07/19/2022 Target Resolution Date: 01/12/2023 Goal Status: Active Interventions: Assess peripheral edema status every visit. Stringfellow, Briante [name] ([id number]) [id number] Page 4 of 7 Treatment Activities: Therapeutic compression applied : 07/19/2022 Notes: Wound/Skin Impairment Nursing Diagnoses: Impaired tissue  integrity Knowledge deficit related to ulceration/compromised skin integrity Goals: Patient/caregiver will verbalize understanding of skin care regimen Date Initiated: 11/14/2022 Target Resolution Date: 01/12/2023 Goal Status: Active Interventions: Assess patient/caregiver ability to obtain necessary supplies Assess patient/caregiver ability to perform ulcer/skin care regimen upon admission and as needed Assess ulceration(s) every visit Treatment Activities: Skin care regimen initiated : 11/14/2022 Topical wound management initiated : 11/14/2022 Notes: Electronic Signature(s) Signed: 12/12/2022 4:09:50 PM By: [name] Pilling RN, BSN Entered By: [name] on 12/12/2022 10:10:16 -------------------------------------------------------------------------------- Pain Assessment Details Patient Name: Date of Service: [name], [name] 12/12/2022 10:00 A [name] Medical Record Number: [id number] Patient Account Number: [id number] Date of Birth/Sex: Treating RN: 08/13/1961 (62 y.o. [name] Primary Care Dazaria Macneill: [name] Other Clinician: Referring Neftaly Inzunza: Treating Mohini Heathcock/Extender: [name] in Treatment: 20 Active Problems Location of Pain Severity and Description of Pain Patient Has Paino Yes Site Locations Rate the pain. Current Pain Level: 3 Pain Management and Medication Current Pain Management: Electronic Signature(s) Signed: 12/12/2022 4:09:50 PM By: [name] Pilling RN, BSN Entered By: [name] on 12/12/2022 10:07:01  [name], [name] ([id number]) [id number] Page 5 of 7 -------------------------------------------------------------------------------- Patient/Caregiver Education Details Patient Name: Date of Service: [name], [name] 4/2/2024andnbsp10:00 A [name] Medical Record Number: [id number] Patient Account Number: [id number] Date of Birth/Gender: Treating RN: 1961-07-08 (62 y.o. [name] Primary Care  Physician: [name] Other Clinician: Referring Physician: Treating Physician/Extender: [name] in Treatment: 20 Education Assessment Education Provided To: Patient Education Topics Provided Wound/Skin Impairment: Handouts: Caring for Your Ulcer Methods: Explain/Verbal Responses: Reinforcements needed Electronic Signature(s) Signed: 12/12/2022 4:09:50 PM By: [name] Pilling RN, BSN Entered By: [name] on 12/12/2022 10:10:33 -------------------------------------------------------------------------------- Wound Assessment Details Patient Name: Date of Service: [name], [name] 12/12/2022 10:00 A [name] Medical Record Number: [id number] Patient Account Number: [id number] Date of Birth/Sex: Treating RN: [birth year] (62 y.o. F) Primary Care Findley Blankenbaker: [name] Other Clinician: Referring Jovanni Eckhart: Treating Verl Whitmore/Extender: [name] in Treatment: 20 Wound Status Wound Number: 19 Primary Diabetic Wound/Ulcer of the Lower Extremity Etiology: Wound Location: Left, Plantar Foot Wound Open Wounding Event: Gradually Appeared Status: Date Acquired: 05/17/2022 Comorbid Cataracts, Lymphedema, Asthma, Congestive Heart Failure, Weeks Of Treatment: 20 History: Coronary Artery Disease, Hypertension, Peripheral Arterial Disease, Clustered Wound: No Peripheral Venous Disease, Type II Diabetes, Rheumatoid Arthritis, Neuropathy Photos Wound Measurements Length: (cm) 10.1 Width: (cm) 3.3 Depth: (cm) 0.2 Area: (cm) 26.177 Volume: (cm) 5.235 % Reduction in Area: -16.5% % Reduction in Volume: 41.7% Epithelialization: Medium (34-66%) Tunneling: No Undermining: No Wound Description Classification: Grade 2 [name], [name] ([id number]) Wound Margin: Distinct, outline attach Exudate Amount: Medium Exudate Type: Serosanguineous Exudate Color: red, brown Foul Odor After Cleansing:  No [id number] Page 6 of 7 ed Slough/Fibrino Yes Wound Bed Granulation Amount: Large (67-100%) Exposed Structure Granulation Quality: Pink Fascia Exposed: No Necrotic Amount: Small (1-33%) Fat Layer (Subcutaneous Tissue) Exposed: Yes Necrotic Quality: Adherent Slough Tendon Exposed: No Muscle Exposed: No Joint Exposed: No Bone Exposed: No Periwound Skin Texture Texture Color No Abnormalities Noted: No No Abnormalities Noted: No Callus: Yes Atrophie Blanche: No Crepitus: No Cyanosis: No Excoriation: No Ecchymosis: No Induration: No Erythema: No Rash: No Hemosiderin Staining: No Scarring: No Mottled: No Pallor: No Moisture Rubor: No No Abnormalities Noted: Yes Temperature / Pain Temperature: No Abnormality Treatment Notes Wound #19 (Foot) Wound Laterality: Plantar, Left Cleanser Soap and Water Discharge Instruction: May shower and wash wound with dial antibacterial soap and water prior to dressing change. Wound Cleanser Discharge Instruction: Cleanse the wound with wound cleanser prior to applying a clean dressing using gauze sponges, not tissue or cotton balls. Peri-Wound Care Topical Primary Dressing Maxorb Extra Ag+ Alginate Dressing, 4x4.75 (in/in) Discharge Instruction: Apply to wound bed as instructed Secondary Dressing ABD Pad, 5x9 Discharge Instruction: Apply over primary dressing as directed. Woven Gauze Sponge, Non-Sterile 4x4 in Discharge Instruction: Apply over primary dressing as directed. Secured With Elastic Bandage 4 inch (ACE bandage) Discharge Instruction: Secure with ACE bandage as directed. Kerlix Roll Sterile, 4.5x3.1 (in/yd) Discharge Instruction: Secure with Kerlix as directed. 68M Medipore Soft Cloth Surgical T 2x10 (in/yd) ape Discharge Instruction: Secure with tape as directed. Compression Wrap Compression Stockings Add-Ons Electronic Signature(s) Signed: 12/12/2022 4:09:50 PM By: [name] Pilling RN,  BSN Entered By: [name] on 12/12/2022 10:09:18 [name], [name] ([id number]) [id number] Page 7 of 7 -------------------------------------------------------------------------------- Vitals Details Patient Name: Date of Service: [name], [name] 12/12/2022 10:00 A [name] Medical Record Number: [id number] Patient Account Number: [id number] Date of Birth/Sex: Treating RN: Dec 17, 1960 (62 y.o. [name] Primary Care Harout Scheurich: [name]  Other Clinician: Referring Kennan Detter: Treating Tamina Cyphers/Extender: [name] in Treatment: 20 Vital Signs Time Taken: 10:06 Temperature (F): 98.5 Height (in): 62 Pulse (bpm): 83 Weight (lbs): 284 Respiratory Rate (breaths/min): 20 Body Mass Index (BMI): 51.9 Blood Pressure (mmHg): 166/67 Capillary Blood Glucose (mg/dl): 148 Reference Range: 80 - 120 mg / dl Electronic Signature(s) Signed: 12/12/2022 4:09:50 PM By: [name] Pilling RN, BSN Entered By: [name] on 12/12/2022 10:06:36

## 2022-12-14 ENCOUNTER — Other Ambulatory Visit: Payer: Self-pay | Admitting: Family Medicine

## 2022-12-20 ENCOUNTER — Telehealth: Payer: Commercial Managed Care - HMO | Admitting: Family Medicine

## 2022-12-26 ENCOUNTER — Encounter (HOSPITAL_BASED_OUTPATIENT_CLINIC_OR_DEPARTMENT_OTHER): Payer: Commercial Managed Care - HMO | Admitting: General Surgery

## 2022-12-26 DIAGNOSIS — E11621 Type 2 diabetes mellitus with foot ulcer: Secondary | ICD-10-CM | POA: Diagnosis not present

## 2022-12-26 NOTE — Progress Notes (Signed)
Duplessis, Skylynne M ([id number]) [id number] Page 1 of 14 Visit Report for 12/26/2022 Chief Complaint Document Details Patient Name: Date of Service: [place], [name] 12/26/2022 10:15 A M Medical Record Number: [id number] Patient Account Number: [id number] Date of Birth/Sex: Treating RN: 1960/10/14 (62 y.o. F) Primary Care Provider: [name] Other Clinician: Referring Provider: Treating Provider/Extender: Vivien Rossetti in Treatment: 22 Information Obtained from: Patient Chief Complaint 10/17/2021; multiple scattered wounds to the abdomen, posterior left leg, left labia and sacrum 07/19/2022: left foot DFU Electronic Signature(s) Signed: 12/26/2022 12:30:23 PM By: Duanne Guess MD FACS Entered By: Duanne Guess on 12/26/2022 12:30:23 -------------------------------------------------------------------------------- Debridement Details Patient Name: Date of Service: [place], [name] 12/26/2022 10:15 A M Medical Record Number: [id number] Patient Account Number: [id number] Date of Birth/Sex: Treating RN: June 20, 1961 (63 y.o. [name] Primary Care Provider: [name] Other Clinician: Referring Provider: Treating Provider/Extender: Vivien Rossetti in Treatment: 22 Debridement Performed for Assessment: Wound #19 Left,Plantar Foot Performed By: Physician Duanne Guess, MD Debridement Type: Debridement Severity of Tissue Pre Debridement: Fat layer exposed Level of Consciousness (Pre-procedure): Awake and Alert Pre-procedure Verification/Time Out Yes - 11:14 Taken: Start Time: 11:14 Pain Control: Lidocaine 4% Topical Solution T Area Debrided (L x W): otal 10 (cm) x 3 (cm) = 30 (cm) Tissue and other material debrided: Non-Viable, Callus, Slough, Slough Level: Non-Viable Tissue Debridement Description: Selective/Open Wound Instrument: Curette Bleeding: Minimum Hemostasis  Achieved: Pressure End Time: 11:15 Procedural Pain: 0 Post Procedural Pain: 0 Response to Treatment: Procedure was tolerated well Level of Consciousness (Post- Awake and Alert procedure): Post Debridement Measurements of Total Wound Length: (cm) 10 Width: (cm) 3 Depth: (cm) 0.2 Volume: (cm) 4.712 Character of Wound/Ulcer Post Debridement: Improved Severity of Tissue Post Debridement: Fat layer exposed Post Procedure Diagnosis Same as Pre-procedure Notes Scribed for Dr. Lady Gary by J.Scotton [name], Niana M ([id number]) [id number] Page 2 of 14 Electronic Signature(s) Signed: 12/26/2022 12:56:28 PM By: Duanne Guess MD FACS Signed: 12/26/2022 3:54:46 PM By: Karie Schwalbe RN Entered By: Karie Schwalbe on 12/26/2022 11:21:43 -------------------------------------------------------------------------------- HPI Details Patient Name: Date of Service: Var[place], [name] 12/26/2022 10:15 A M Medical Record Number: [id number] Patient Account Number: [id number] Date of Birth/Sex: Treating RN: [birth year] (62 y.o. F) Primary Care Provider: [name] Other Clinician: Referring Provider: Treating Provider/Extender: Vivien Rossetti in Treatment: 22 History of Present Illness HPI Description: 07/23/2019 on evaluation today patient presents with a myriad of wounds noted at multiple locations over her right heel, right lower extremity, and abdominal region. She also has bilateral lower extremity lymphedema which is quite significant as well. Incidentally she also has congestive heart failure and hypertension. She also is obese. With that being said I think all this is contributing as well to her lower extremity edema which is very much uncontrolled. It seems like its been at least several months since she is worn any compression according to what she tells me. With that being said I am not sure exactly when that would have been. She  does have fibrotic changes in the lower extremity secondary to lymphedema worse on the left than the right. She did show me pictures of wound she had on the anterior portion of her shin which was quite significant fortunately that has healed. These issues have been intermittent at this time. Again I think that with appropriate compression therapy she may actually be doing better than what we are seeing at this point but again I am not really  sure that she is ever been extremely compliant with that. Fortunately there is no signs of active infection at this time. No fever chills noted. As far as the abdominal ulcer she initially had one wound that she thinks may have been a bug bite. Subsequently she was put a dressing on this and she states that the tape pulled skin off on other locations causing other wounds that have not healed that is been about 3 months. The wounds on her lower extremities have been intermittent over 3 years. This includes the heel. 11/19; this is a patient that I have not seen previously. She was admitted to our clinic last week with multiple wounds including several superficial circular areas on her abdomen predominantly right upper quadrant. Also 1 in her umbilicus. She has an area on her right lateral malleolus which was new today. She has bilateral lower extremity edema with very significant stasis dermatitis on the left anterior tibial area. She has tightly adherent skin in her lower extremities probably secondary to cutaneous fibrosis in the area. We put her in compression last week. She comes in with the dressings reasonably saturated. 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 She has multiple areas on her arms from scratching. 12/3; the patient I saw for the first time 2 weeks  ago. She has 3 small circular areas on her abdomen 2 in the right upper quadrant one on her umbilicus. We have been using silver alginate to this area. She also had an area on her right lateral malleolus and right heel and right medial malleolus. We have been using silver alginate here under compression. She does not have an open area on the left leg today. We are going to order her stockings for the left leg. She clearly has chronic lymphedema in these areas. X-ray of the foot from 10/20 did not show any fracture or dislocation. The heel wound was noted there was no evidence of osteomyelitis She complains of generalized pruritus. 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 She has longstanding lymphedema. Although she does not have known liver disease I looked at his CT scan of the abdomen from February of this year that showed hepatic steatosis 12/11; patient still has no open area on the left leg we transitioned her into a stocking she had. Her stockings from [place] are supposed to be arriving later today which will be the stockings of choice. The only wound remaining on the right is the right heel 2 small superficial open areas. Everything else is well on its way to healing in the right leg few small excoriations. She has 1 major area remaining on her abdomen the rest seem to be healing 12/22 patient still has no open area on the left leg and she is still in a stocking. She had still 2 open areas on the tip of her right heel. These look like pressure related areas although the patient is not really certain how this is happening. She has an open heeled shoe that we provided. Still has 1 open area on the right lateral abdomen The patient has complaints of generalized pruritus. She has multiple excoriated areas on her abdomen that of close over her arms or thighs which I think are from scratching. She tells me that she has never had a basic work-up for this  which  might include basic lab work, liver functions, kidney functions, thyroid etc. She might also benefit from a dermatologist 12/29; the patient has a deeper larger more painful wound on the tip of her right heel. Again these look like pressure ulcers although she wears an open toed heel pads or heel at night to prevent it from hitting the mattress etc. They tell me and remind me that this is been present on and off for about 2 years that has closed over but then will reopen. I did look over her lab work that was available in [facility] link. She does not have elevated liver function tests or creatinine. I was not able to see a TSH. This was in response to her complaints of generalized itching and a itch/scratch cycle. I also noted that she is on OxyContin and oxycodone and of course narcotics can cause generalized pruritus as a side effect Readmission: History From [place], [place] Last Week: 03/29/2021 this is a patient who presents for initial evaluation here in the clinic though I have seen her 1 time previously in 2020 this was in [place]. That was actually her initial visit therefore a heel ulceration. Subsequently that did not healing she actually saw me the 1 time in November and then subsequently saw Dr. Leanord Hawking through the end of December where she apparently was healed. 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 This is on the right. With that being said right now she has a basically region on the lateral portion of the ray site where she is applying more pressure especially as her foot seems to be starting to turn in and this has become an increasingly significant issue for her to be honest. She does see Dr. Lajoyce Corners and Dr. Lajoyce Corners has had her on doxycycline for quite a bit of time here. Subsequently she is just now wrapping that out. I think that she probably would be benefited by continue  the doxycycline for a time while we can work through what we need to do with things here. She does have evidence of osteomyelitis based on what I saw on the MRI today. This obviously is unfortunate and definitely not something that she was hoping to hear. With that being said I do believe that she would potentially be a strong candidate for hyperbaric oxygen therapy. Also believe she could be a candidate for total contact cast though we have to be cautious due to the fact that how her foot is bending inward I think that this is good to be a little bit of a concern. We will definitely have to keeping a close eye on things. She does have a history of diabetes mellitus type 2. She also other than the amputation has a medical history positive for hypertension. She has never undergone hyperbaric oxygen therapy previously I did show her the chamber we did talk a little bit about it today as well. Admission T [facility]: o 04/06/2021 Patient presents here in [place] today for evaluation. Again she is establishing care here after I saw her last week in [place]. Obviously I think that she is doing extremely well at this point which is great news and in general I am extremely pleased with where things stand from the standpoint of the appearance of the wound. Obviously this is not significantly smaller but does look a lot cleaner I think using the  Hydrofera Blue has been of benefit over the past week. Nonetheless she still has a wound with issues with pressure here which I think is good to be an ongoing issue. Also think that the osteomyelitis which is chronic is also getting ongoing issue. She does want to try to do what she can to prevent this from worsening and ending with a below-knee Westerhold, Leilanie M ([id number]) [id number] Page 3 of 14 amputation. T that end I do think that getting her into the hyperbaric oxygen chamber would be what we need to do. She has recently  been seen by cardiology o and subsequently well as far as that is concerned. Her ejection fraction was appropriate for hyperbarics and everything seems to be doing great in that regard. She has not had a recent chest x-ray she is a former smoker around 15 years ago she quit. Nonetheless I do believe with her asthma we do want to do a chest x-ray just to make sure everything is okay before proceeding with the hyperbarics although we can go ahead and see about getting the approval. I also think she is going require some sharp debridement and around this wound we also have to contact her insurance for prior approval on this as well. 04/13/2021 upon evaluation today patient appears to be doing a little bit better in regard to her leg in fact the swelling is dramatically better. Very pleased with where things stand in that regard. Fortunately there does not appear to be any signs of active infection at this time. No fevers, chills, nausea, vomiting, or diarrhea. 04/20/2021 upon evaluation today patient appears to be doing decently well in regard to her foot. There is some need for sharp debridement here today. With that being said we will get a go ahead and proceed with that today as the foot is becoming somewhat macerated with the overhanging callus that is definitely not we want to see. With that being said the patient does have an issue here as well with a boil in the perineal region unfortunately that is an issue for her today as well. I did have a look at that as well. We are still working on dealing with insurance as far as getting hyperbarics approved. 04/27/2021 upon evaluation today patient appears to be doing somewhat poorly in general compared to where she has been. At this point she is having a lot of swelling which is the main concerning thing that I am seeing. There does not appear to be any signs of infection currently which is good news but at the same time I do feel like that she is a lot more  swollen than she was even last week and is showing how much she is weeping in regard to the right lower leg. She also has a blister on the left breast although is not an open wound at this time. She continues to have the area in the perineum as well. 05/04/2021 upon evaluation today patient appears to actually be doing decently well in regard to her wound on the foot. Fortunately there is no signs of active infection at this time. No fevers, chills, nausea, vomiting, or diarrhea. Unfortunately she does have an area on the left breast which is actually appearing to be a burn based on what I see physically. She does note that she uses rice bags for areas in general and may have left one in that area not realizing it was burning her as she does not really have much feeling.  I think this is very probable based on what I am seeing. For that reason working to treat this as such I do think we need to loosen up a lot of the necrotic tissue here. I Am going tosend in a prescription for Santyl for the patient. 9/1; patient presents for HBO today but cannot do treatment due to wheezing and feeling short of breath when laying down. She was set up as a doctor visit today since she was not able to do HBO. She states she feels okay overall and started having a dry cough over the past couple days. She has not tested herself for COVID. She denies fever/chills or sputum production. She thinks her symptoms are related to allergies. She has been using silver alginate to her right plantar foot wound. She has not been using Santyl to the breast wound because she reports forgetting to do this. She uses zinc oxide to her perennial wound. She currently denies systemic signs of infection. 9/8; patient presents for follow-up. She has been a unable to do HBO treatments for the past week due to her back pain. She is currently taking Flexeril to address this issue. She reports no issues with her compression wrap. She has been putting  Santyl on the left breast wound and Monistat cream to the perennial wound. She denies signs of infection. 9/15; patient presents for follow-up. Again she is unable to do HBO treatment today due to sinus pain. She has 2 new wounds to her right foot which she is unaware of. She has been putting Santyl on the left breast wound and zinc oxide to the perineal wound. She currently denies signs of infection. Readmission 10/17/2021 Ms. Noelie Renfrow is a 62 year old female with a past medical history of type 2 diabetes, osteomyelitis status post right BKA and morbid obesity that presents to the clinic for multiple scattered open wounds to her body. These are located to the abdomen, left posterior leg and sacral region. She has been using mupirocin ointment, Neosporin and zinc oxide to the wound beds. She has been started on Bactrim and Keflex by her primary care physician and states she has several days left to complete the course. She currently denies systemic signs of infection. READMISSION This is a 61 year old morbidly obese type II diabetic (poorly controlled with last hemoglobin A1c 9.2%). She has congestive heart failure, peripheral vascular disease, hypertension, coronary artery disease, venous stasis, connective tissue disease (o Sjogren's syndrome), and bilateral lower extremity edema. She has been seen here for various reasons in the past. She has a right above-knee amputation. She was recently hospitalized for a left diabetic foot ulcer. Orthopedic surgery was consulted. An MRI was performed that was not conclusive for osteomyelitis, but given the extent of the necrosis, orthopedics recommended amputation. Infectious disease was also consulted and have recommended a 6-week course of oral doxycycline and Augmentin. Apparently wound care was also consulted while she was in the hospital and simply recommended painting the area with Betadine. She saw infectious disease on October 27 with plans to  continue doxycycline on Augmentin to continue therapy until November 14. The infectious disease provider also recommended that the patient reconsider amputation. Her PCP referred her to the wound care center, as the patient is very reluctant to undergo amputation. She also has a small ulcer on her anterior tibial surface that recently opened after a minor trauma. On exam, her left foot is rolled such that the lateral aspect of her foot is the contact surface. There is  a large ulcer with exposed necrotic muscle and fat. The wound does not probe to bone, but apparently there was bone exposure in the past while she was in the hospital. No malodor or purulent drainage. The wound on her anterior tibial surface is small with just a little slough accumulation. She has modest edema and skin changes consistent with stasis dermatitis. 07/28/2022: The anterior tibial wound is healed. The left plantar foot wound looks a little bit better today. There is significantly less necrotic tissue present. There is an area at the most caudal aspect of the wound that looks like there is ongoing pressure-induced tissue injury. Bone remains covered. 08/11/2022: The wound has deteriorated quite a bit since her last visit. Her husband reports that she has had significantly more drainage, such that he has had to change the dressing multiple times per day. During the course of the visit, she recalled that she had not been taking her Lasix for at least 4 days. There is substantial periwound maceration and further tissue breakdown. There is necrotic tissue at the midportion of the wound and tendon is now exposed underneath this necrotic muscle. 08/17/2022: The wound looks better this week. There is still an area of necrotic muscle and tendon in the midfoot, but the forefoot has improved with some epithelium beginning to come in from the margins. Unfortunately, there is evidence of ongoing pressure-induced tissue injury near the heel.  The patient does admit to resting her heel on a stool frequently. She is still taking Bactrim as prescribed and her Keystone topical antibiotic compound should arrive at their home today. 12/14; fairly large sized wound on the left plantar foot. She has a right AKA. She uses a leg only for transfers she says she does not propel her wheelchair with that foot. She has been using Keystone, silver alginate and an Ace wrap. She has completed her Bactrim and Augmentin. She is a type II diabetic. The most worrisome part of this wound is a smaller area roughly 2 x 2 in the distal part of the wound which is 100% occupied by necrotic tendon 09/07/2022: She had an extensive debridement at her last visit. The wound is smaller today and all of the tissues appear healthier. 09/15/2022: The wound measurements are about the same, but overall the surface appears healthier. There is still a little bit of the nonviable tendon exposed at the midfoot. 10/13/2022: The patient has been ill and unable to attend clinic for almost a month. Today, the wound measures smaller and there are buds of epithelium beginning to emerge, particularly at the calcaneus. There is a bridge of skin trying to come across the midportion of the wound, as well. Minimal slough accumulation. 10/26/2022: The absolute wound measurements have not changed, but the surface is technically smaller due to a bridge of epithelium that nearly divides the wound in two. There is some eschar at the medial edge of the calcaneal aspect of the wound, underneath which there is good epithelium. There is some slough on the wound surface, predominantly at the heel. 11/14/2022: She has been absent from clinic for couple of weeks due to health issues. There is more epithelium encroaching upon the edges of the wound. She has built up some eschar around the edges. There is thin biofilm and slough on the wound surface. Sutphen, Audrina M ([id number])  [id number] Page 4 of 14 12/12/2022: The wound is narrower this week. There is some eschar and macerated callus around the edges with biofilm on the surface.  There is a bridge of skin attempting to divide the wound into 2 sections. 12/26/2022: The wound on her plantar left foot is smaller and the bridge of skin between the 2 sections is getting wider. There is a little bit of eschar around the edges and thin slough on the surface. Unfortunately, she has multiple additional wounds that have actually been present for some time but she only just now is bringing them to our attention. She has wounds on her pubis and on the distal portion of her AKA stump as well as a wound on her mid abdomen. The pubis and AKA stump wounds look as though they are secondary to friction or pressure. The wound in her mid abdomen is of unclear etiology. It is quite dry. She has moisture-related tissue breakdown in her intertriginous folds and there is a strong smell of yeast coming from her groin region. Electronic Signature(s) Signed: 12/26/2022 12:47:38 PM By: Duanne Guess MD FACS Entered By: Duanne Guess on 12/26/2022 12:47:38 -------------------------------------------------------------------------------- Physical Exam Details Patient Name: Date of Service: Posner, [name] 12/26/2022 10:15 A M Medical Record Number: [id number] Patient Account Number: [id number] Date of Birth/Sex: Treating RN: 25-Jul-1961 (62 y.o. F) Primary Care Provider: [name] Other Clinician: Referring Provider: Treating Provider/Extender: Vivien Rossetti in Treatment: 22 Constitutional . . . . no acute distress. Respiratory Normal work of breathing on room air. Notes 12/26/2022: The wound on her plantar left foot is smaller and the bridge of skin between the 2 sections is getting wider. There is a little bit of eschar around the edges and thin slough on the surface. She  has wounds on her pubis and on the distal portion of her AKA stump as well as a wound on her mid abdomen. The pubis and AKA stump wounds look as though they are secondary to friction or pressure. The wound in her mid abdomen is of unclear etiology. It is quite dry. She has moisture-related tissue breakdown in her intertriginous folds and there is a strong smell of yeast coming from her groin region. Electronic Signature(s) Signed: 12/26/2022 12:48:52 PM By: Duanne Guess MD FACS Entered By: Duanne Guess on 12/26/2022 12:48:52 -------------------------------------------------------------------------------- Physician Orders Details Patient Name: Date of Service: [place]rubel, [name] 12/26/2022 10:15 A M Medical Record Number: [id number] Patient Account Number: [id number] Date of Birth/Sex: Treating RN: 10/07/1960 (62 y.o. 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 L97.112 Non-pressure chronic ulcer of right thigh with fat layer exposed L98.492 Non-pressure chronic ulcer of skin of other sites with fat layer exposed L97.822 Non-pressure chronic ulcer of other part of left lower leg with fat layer exposed M86.9 Osteomyelitis, unspecified E11.621 Type 2 diabetes mellitus with foot ulcer E66.01 Morbid (severe) obesity due to excess calories M35.00 Sjogren syndrome, unspecified I73.9 Peripheral vascular disease, unspecified I50.32 Chronic diastolic (congestive) heart failure 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 - **** EXTRA TIME at  least 45 mins COMPLEX patient**** Return A Dr. Lady Gary Room 3 Other: - over the counter antifungal spray Anesthetic (In clinic) Topical Lidocaine 5% applied to wound bed Bathing/ Shower/ Hygiene May shower and wash wound with soap and water. - with  dressing changes Edema Control - Lymphedema / SCD / Other Elevate legs to the level of the heart or above for 30 minutes daily and/or when sitting for 3-4 times a day throughout the day. Avoid standing for long periods of time. Patient to wear own compression stockings every day. Off-Loading Other: - Try and elevate Left leg throughout the day Wound Treatment Wound #19 - Foot Wound Laterality: Plantar, Left Cleanser: Soap and Water 1 x Per Day/30 Days Discharge Instructions: May shower and wash wound with dial antibacterial soap and water prior to dressing change. Cleanser: Wound Cleanser (Generic) 1 x Per Day/30 Days Discharge Instructions: Cleanse the wound with wound cleanser prior to applying a clean dressing using gauze sponges, not tissue or cotton balls. Prim Dressing: Maxorb Extra Ag+ Alginate Dressing, 4x4.75 (in/in) (Generic) 1 x Per Day/30 Days ary Discharge Instructions: Apply to wound bed as instructed Secondary Dressing: ABD Pad, 5x9 (Generic) 1 x Per Day/30 Days Discharge Instructions: Apply over primary dressing as directed. Secondary Dressing: Woven Gauze Sponge, Non-Sterile 4x4 in (Generic) 1 x Per Day/30 Days Discharge Instructions: Apply over primary dressing as directed. Secured With: Elastic Bandage 4 inch (ACE bandage) (Generic) 1 x Per Day/30 Days Discharge Instructions: Secure with ACE bandage as directed. Secured With: [employer], 4.5x3.1 (in/yd) (Generic) 1 x Per Day/30 Days Discharge Instructions: Secure with Kerlix as directed. Secured With: 6M Medipore Scientist, research (life sciences) Surgical T 2x10 (in/yd) (Generic) 1 x Per Day/30 Days ape Discharge Instructions: Secure with tape as directed. Wound #20 - Amputation Site  - Above Knee Wound Laterality: Right Prim Dressing: Maxorb Extra CMC/Alginate Dressing, 4x4 (in/in) 1 x Per Day/30 Days ary Discharge Instructions: Apply to wound bed as instructed Secondary Dressing: ALLEVYN Gentle Border, 5x5 (in/in) 1 x Per Day/30 Days Discharge Instructions: Apply over primary dressing as directed. Wound #21 - Pubis Wound Laterality: Medial Prim Dressing: Maxorb Extra Ag+ Alginate Dressing, 4x4.75 (in/in) Every Other Day/30 Days ary Discharge Instructions: Apply to wound bed as instructed Secondary Dressing: ABD Pad, 8x10 Every Other Day/30 Days Discharge Instructions: Apply over primary dressing as directed. Wound #22 - Upper Leg Wound Laterality: Left, Medial Prim Dressing: Maxorb Extra Ag+ Alginate Dressing, 4x4.75 (in/in) 1 x Per Day/30 Days ary Discharge Instructions: Apply to wound bed as instructed Secondary Dressing: ABD Pad, 8x10 1 x Per Day/30 Days Discharge Instructions: Apply over primary dressing as directed. Secured With: 6M Medipore H Soft Cloth Surgical T ape, 4 x 10 (in/yd) 1 x Per Day/30 Days Discharge Instructions: Secure with tape as directed. Wound #23 - Umbilicus Wound Laterality: Midline Prim Dressing: MediHoney Gel, tube 1.5 (oz) 1 x Per Day/30 Days ary Discharge Instructions: Apply to wound bed as instructed Secondary Dressing: ABD Pad, 8x10 1 x Per Day/30 Days Discharge Instructions: Apply over primary dressing as directed. Hargreaves, Rosmarie M ([id number]) [id number] Page 6 of 14 Secured With: 6M Medipore H Soft Cloth Surgical T ape, 4 x 10 (in/yd) 1 x Per Day/30 Days Discharge Instructions: Secure with tape as directed. Wound #24 - Abdomen - midline Topical: Ketoconazole Cream 2% 1 x Per Day/30 Days Discharge Instructions: Apply Ketoconazole as directed Topical: Interdry 1 x Per Day/30 Days Discharge Instructions: Apply to under [facility] Electronic Signature(s) Signed: 12/26/2022 12:56:28 PM By: Duanne Guess  MD FACS Entered By: Duanne Guess on 12/26/2022 12:49:12 -------------------------------------------------------------------------------- Problem List Details Patient Name: Date of Service: Milstein, [name] 12/26/2022 10:15 A M Medical Record Number: [id number] Patient Account Number: [id number] Date of Birth/Sex: Treating RN: Jan 07, 1961 (  62 y.o. 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 07/19/2022 No Yes L97.112 Non-pressure chronic ulcer of right thigh with fat layer exposed 12/26/2022 No Yes L98.492 Non-pressure chronic ulcer of skin of other sites with fat layer exposed 12/26/2022 No Yes L97.822 Non-pressure chronic ulcer of other part of left lower leg with fat layer exposed4/16/2024 No Yes M86.9 Osteomyelitis, unspecified 07/19/2022 No Yes E11.621 Type 2 diabetes mellitus with foot ulcer 07/19/2022 No Yes E66.01 Morbid (severe) obesity due to excess calories 07/19/2022 No Yes M35.00 Sjogren syndrome, unspecified 07/19/2022 No Yes I73.9 Peripheral vascular disease, unspecified 07/19/2022 No Yes I50.32 Chronic diastolic (congestive) heart failure 07/19/2022 No Yes 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 12/26/2022 10:15 A M Medical  Record Number: [id number] Patient Account Number: [id number] Date of Birth/Sex: Treating RN: [birth year] (62 y.o. F) Primary Care Provider: [name] Other Clinician: Referring Provider: Treating Provider/Extender: Vivien Rossetti in Treatment: 22 Subjective Chief Complaint Information obtained from Patient 10/17/2021; multiple scattered wounds to the abdomen, posterior left leg, left labia and sacrum 07/19/2022: left foot DFU History of Present Illness (HPI) 07/23/2019 on evaluation today patient presents with a myriad of wounds noted at multiple locations over her right heel, right lower extremity, and abdominal region. She also has bilateral lower extremity lymphedema which is quite significant as well. Incidentally she also has congestive heart failure and hypertension. She also is obese. With that being said I think all this is contributing as well to her lower extremity edema which is very much uncontrolled. It seems like its been at least several months since she is worn any compression according to what she tells me. With that being said I am not sure exactly when that would have been. She does have fibrotic changes in the lower extremity secondary to lymphedema worse on the left than the right. She did show me pictures of wound she had on the anterior portion of her shin which was quite significant fortunately that has healed. These issues have been intermittent at this time. Again I think that with appropriate compression therapy she may actually be doing better than what we are seeing at this point but again I am not really sure that she is ever been extremely compliant with that. Fortunately there is no signs of active infection at this time. No fever chills noted. As far as the abdominal ulcer she initially had one wound that she thinks may have been a bug bite. Subsequently she was put a dressing on this and she states that the tape pulled skin off on  other locations causing other wounds that have not healed that is been about 3 months. The wounds on her lower extremities have been intermittent over 3 years. This includes the heel. 11/19; this is a patient that I have not seen previously. She was admitted to our clinic last week with multiple wounds including several superficial circular areas on her abdomen predominantly right upper quadrant. Also 1 in her umbilicus.  She has an area on her right lateral malleolus which was new today. She has bilateral lower extremity edema with very significant stasis dermatitis on the left anterior tibial area. She has tightly adherent skin in her lower extremities probably secondary to cutaneous fibrosis in the area. We put her in compression last week. She comes in with the dressings reasonably saturated. 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 She has multiple areas on her arms from scratching. 12/3; the patient I saw for the first time 2 weeks ago. She has 3 small circular areas on her abdomen 2 in the right upper quadrant one on her umbilicus. We have been using silver alginate to this area. She also had an area on her right lateral malleolus and right heel and right medial malleolus. We have been using silver alginate here under compression. She does not have an open area on the left leg today. We are going to order her stockings for the left leg. She clearly has chronic lymphedema in these areas. X-ray of the foot from 10/20 did not show any fracture or dislocation. The heel wound was noted there was no evidence of osteomyelitis She complains of generalized pruritus. 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 She has longstanding lymphedema. Although she does not have known  liver disease I looked at his CT scan of the abdomen from February of this year that showed hepatic steatosis 12/11; patient still has no open area on the left leg we transitioned her into a stocking she had. Her stockings from [employer] are supposed to be arriving later today which will be the stockings of choice. The only wound remaining on the right is the right heel 2 small superficial open areas. Everything else is well on its way to healing in the right leg few small excoriations. She has 1 major area remaining on her abdomen the rest seem to be healing 12/22 patient still has no open area on the left leg and she is still in a stocking. She had still 2 open areas on the tip of her right heel. These look like pressure related areas although the patient is not really certain how this is happening. She has an open heeled shoe that we provided. Still has 1 open area on the right lateral abdomen The patient has complaints of generalized pruritus. She has multiple excoriated areas on her abdomen that of close over her arms or thighs which I think are from scratching. She tells me that she has never had a basic work-up for this which might include basic lab work, liver functions, kidney functions, thyroid etc. She might also benefit from a dermatologist 12/29; the patient has a deeper larger more painful wound on the tip of her right heel. Again these look like pressure ulcers although she wears an open toed heel pads or heel at night to prevent it from hitting the mattress etc. They tell me and remind me that this is been present on and off for about 2 years that has closed over but then will reopen. I did look over her lab work that was available in [facility]. She does not have elevated liver function tests or creatinine. I was not able to see a TSH. This was in response to her complaints of generalized itching and a itch/scratch  cycle. I also noted that she is on OxyContin and oxycodone and of  course narcotics can cause generalized pruritus as a side effect Readmission: History From [place], [place] Last Week: 03/29/2021 this is a patient who presents for initial evaluation here in the clinic though I have seen her 1 time previously in 2020 this was in [place]. That Hohmann, Yuka M ([id number]) [id number] Page 8 of 14 was actually her initial visit therefore a heel ulceration. Subsequently that did not healing she actually saw me the 1 time in November and then subsequently saw Dr. Leanord Hawking through the end of December where she apparently was healed. 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 This is on the right. With that being said right now she has a basically region on the lateral portion of the ray site where she is applying more pressure especially as her foot seems to be starting to turn in and this has become an increasingly significant issue for her to be honest. She does see Dr. Lajoyce Corners and Dr. Lajoyce Corners has had her on doxycycline for quite a bit of time here. Subsequently she is just now wrapping that out. I think that she probably would be benefited by continue the doxycycline for a time while we can work through what we need to do with things here. She does have evidence of osteomyelitis based on what I saw on the MRI today. This obviously is unfortunate and definitely not something that she was hoping to hear. With that being said I do believe that she would potentially be a strong candidate for hyperbaric oxygen therapy. Also believe she could be a candidate for total contact cast though we have to be cautious due to the fact that how her foot is bending inward I think that this is good to be a little bit of a concern. We will definitely have to keeping a close eye on things. She does have a history of diabetes mellitus type 2. She also other than  the amputation has a medical history positive for hypertension. She has never undergone hyperbaric oxygen therapy previously I did show her the chamber we did talk a little bit about it today as well. Admission [facility]: o 04/06/2021 Patient presents here in [place] today for evaluation. Again she is establishing care here after I saw her last week in [place]. Obviously I think that she is doing extremely well at this point which is great news and in general I am extremely pleased with where things stand from the standpoint of the appearance of the wound. Obviously this is not significantly smaller but does look a lot cleaner I think using the Hydrofera Blue has been of benefit over the past week. Nonetheless she still has a wound with issues with pressure here which I think is good to be an ongoing issue. Also think that the osteomyelitis which is chronic is also getting ongoing issue. She does want to try to do what she can to prevent this from worsening and ending with a below-knee amputation. T that end I do think that getting her into the hyperbaric oxygen chamber would be what we need to do. She has recently been seen by cardiology o and subsequently well as far as that is concerned. Her ejection fraction was appropriate for hyperbarics and everything seems to be doing great in that regard. She has not had  a recent chest x-ray she is a former smoker around 15 years ago she quit. Nonetheless I do believe with her asthma we do want to do a chest x-ray just to make sure everything is okay before proceeding with the hyperbarics although we can go ahead and see about getting the approval. I also think she is going require some sharp debridement and around this wound we also have to contact her insurance for prior approval on this as well. 04/13/2021 upon evaluation today patient appears to be doing a little bit better in regard to her leg in fact the swelling is dramatically better.  Very pleased with where things stand in that regard. Fortunately there does not appear to be any signs of active infection at this time. No fevers, chills, nausea, vomiting, or diarrhea. 04/20/2021 upon evaluation today patient appears to be doing decently well in regard to her foot. There is some need for sharp debridement here today. With that being said we will get a go ahead and proceed with that today as the foot is becoming somewhat macerated with the overhanging callus that is definitely not we want to see. With that being said the patient does have an issue here as well with a boil in the perineal region unfortunately that is an issue for her today as well. I did have a look at that as well. We are still working on dealing with insurance as far as getting hyperbarics approved. 04/27/2021 upon evaluation today patient appears to be doing somewhat poorly in general compared to where she has been. At this point she is having a lot of swelling which is the main concerning thing that I am seeing. There does not appear to be any signs of infection currently which is good news but at the same time I do feel like that she is a lot more swollen than she was even last week and is showing how much she is weeping in regard to the right lower leg. She also has a blister on the left breast although is not an open wound at this time. She continues to have the area in the perineum as well. 05/04/2021 upon evaluation today patient appears to actually be doing decently well in regard to her wound on the foot. Fortunately there is no signs of active infection at this time. No fevers, chills, nausea, vomiting, or diarrhea. Unfortunately she does have an area on the left breast which is actually appearing to be a burn based on what I see physically. She does note that she uses rice bags for areas in general and may have left one in that area not realizing it was burning her as she does not really have much feeling. I  think this is very probable based on what I am seeing. For that reason working to treat this as such I do think we need to loosen up a lot of the necrotic tissue here. I Am going tosend in a prescription for Santyl for the patient. 9/1; patient presents for HBO today but cannot do treatment due to wheezing and feeling short of breath when laying down. She was set up as a doctor visit today since she was not able to do HBO. She states she feels okay overall and started having a dry cough over the past couple days. She has not tested herself for COVID. She denies fever/chills or sputum production. She thinks her symptoms are related to allergies. She has been using silver alginate to her right  plantar foot wound. She has not been using Santyl to the breast wound because she reports forgetting to do this. She uses zinc oxide to her perennial wound. She currently denies systemic signs of infection. 9/8; patient presents for follow-up. She has been a unable to do HBO treatments for the past week due to her back pain. She is currently taking Flexeril to address this issue. She reports no issues with her compression wrap. She has been putting Santyl on the left breast wound and Monistat cream to the perennial wound. She denies signs of infection. 9/15; patient presents for follow-up. Again she is unable to do HBO treatment today due to sinus pain. She has 2 new wounds to her right foot which she is unaware of. She has been putting Santyl on the left breast wound and zinc oxide to the perineal wound. She currently denies signs of infection. Readmission 10/17/2021 Ms. Karle Desrosier is a 62 year old female with a past medical history of type 2 diabetes, osteomyelitis status post right BKA and morbid obesity that presents to the clinic for multiple scattered open wounds to her body. These are located to the abdomen, left posterior leg and sacral region. She has been using mupirocin ointment, Neosporin and zinc oxide  to the wound beds. She has been started on Bactrim and Keflex by her primary care physician and states she has several days left to complete the course. She currently denies systemic signs of infection. READMISSION This is a 62 year old morbidly obese type II diabetic (poorly controlled with last hemoglobin A1c 9.2%). She has congestive heart failure, peripheral vascular disease, hypertension, coronary artery disease, venous stasis, connective tissue disease (o Sjogren's syndrome), and bilateral lower extremity edema. She has been seen here for various reasons in the past. She has a right above-knee amputation. She was recently hospitalized for a left diabetic foot ulcer. Orthopedic surgery was consulted. An MRI was performed that was not conclusive for osteomyelitis, but given the extent of the necrosis, orthopedics recommended amputation. Infectious disease was also consulted and have recommended a 6-week course of oral doxycycline and Augmentin. Apparently wound care was also consulted while she was in the hospital and simply recommended painting the area with Betadine. She saw infectious disease on October 27 with plans to continue doxycycline on Augmentin to continue therapy until November 14. The infectious disease provider also recommended that the patient reconsider amputation. Her PCP referred her to the wound care center, as the patient is very reluctant to undergo amputation. She also has a small ulcer on her anterior tibial surface that recently opened after a minor trauma. On exam, her left foot is rolled such that the lateral aspect of her foot is the contact surface. There is a large ulcer with exposed necrotic muscle and fat. The wound does not probe to bone, but apparently there was bone exposure in the past while she was in the hospital. No malodor or purulent drainage. The wound on her anterior tibial surface is small with just a little slough accumulation. She has modest edema and  skin changes consistent with stasis dermatitis. 07/28/2022: The anterior tibial wound is healed. The left plantar foot wound looks a little bit better today. There is significantly less necrotic tissue present. There is an area at the most caudal aspect of the wound that looks like there is ongoing pressure-induced tissue injury. Bone remains covered. 08/11/2022: The wound has deteriorated quite a bit since her last visit. Her husband reports that she has had significantly more drainage,  such that he has had to change the dressing multiple times per day. During the course of the visit, she recalled that she had not been taking her Lasix for at least 4 days. There is substantial periwound maceration and further tissue breakdown. There is necrotic tissue at the midportion of the wound and tendon is now exposed underneath this necrotic muscle. 08/17/2022: The wound looks better this week. There is still an area of necrotic muscle and tendon in the midfoot, but the forefoot has improved with some epithelium beginning to come in from the margins. Unfortunately, there is evidence of ongoing pressure-induced tissue injury near the heel. The patient does admit to resting her heel on a stool frequently. She is still taking Bactrim as prescribed and her Keystone topical antibiotic compound should arrive at their home today. Behrman, Annakate M ([id number]) [id number] Page 9 of 14 12/14; fairly large sized wound on the left plantar foot. She has a right AKA. She uses a leg only for transfers she says she does not propel her wheelchair with that foot. She has been using Keystone, silver alginate and an Ace wrap. She has completed her Bactrim and Augmentin. She is a type II diabetic. The most worrisome part of this wound is a smaller area roughly 2 x 2 in the distal part of the wound which is 100% occupied by necrotic tendon 09/07/2022: She had an extensive debridement at her last visit. The  wound is smaller today and all of the tissues appear healthier. 09/15/2022: The wound measurements are about the same, but overall the surface appears healthier. There is still a little bit of the nonviable tendon exposed at the midfoot. 10/13/2022: The patient has been ill and unable to attend clinic for almost a month. Today, the wound measures smaller and there are buds of epithelium beginning to emerge, particularly at the calcaneus. There is a bridge of skin trying to come across the midportion of the wound, as well. Minimal slough accumulation. 10/26/2022: The absolute wound measurements have not changed, but the surface is technically smaller due to a bridge of epithelium that nearly divides the wound in two. There is some eschar at the medial edge of the calcaneal aspect of the wound, underneath which there is good epithelium. There is some slough on the wound surface, predominantly at the heel. 11/14/2022: She has been absent from clinic for couple of weeks due to health issues. There is more epithelium encroaching upon the edges of the wound. She has built up some eschar around the edges. There is thin biofilm and slough on the wound surface. 12/12/2022: The wound is narrower this week. There is some eschar and macerated callus around the edges with biofilm on the surface. There is a bridge of skin attempting to divide the wound into 2 sections. 12/26/2022: The wound on her plantar left foot is smaller and the bridge of skin between the 2 sections is getting wider. There is a little bit of eschar around the edges and thin slough on the surface. Unfortunately, she has multiple additional wounds that have actually been present for some time but she only just now is bringing them to our attention. She has wounds on her pubis and on the distal portion of her AKA stump as well as a wound on her mid abdomen. The pubis and AKA stump wounds look as though they are secondary to friction or pressure. The wound  in her mid abdomen is of unclear etiology. It is quite dry. She has  moisture-related tissue breakdown in her intertriginous folds and there is a strong smell of yeast coming from her groin region. Patient History Information obtained from Patient. Family History Diabetes - Mother, Heart Disease - Mother,Father,Siblings, Hypertension - Mother,Father,Siblings, Lung Disease - Father, Stroke - Mother, No family history of Cancer, Hereditary Spherocytosis, Kidney Disease, Seizures, Thyroid Problems, Tuberculosis. Social History Former smoker - quit 15 years ago, Marital Status - Married, Alcohol Use - Never, Drug Use - No History, Caffeine Use - Rarely. Medical History Eyes Patient has history of Cataracts - both eyes Denies history of Glaucoma, Optic Neuritis Ear/Nose/Mouth/Throat Denies history of Chronic sinus problems/congestion, Middle ear problems Hematologic/Lymphatic Patient has history of Lymphedema Denies history of Anemia, Hemophilia, Human Immunodeficiency Virus, Sickle Cell Disease Respiratory Patient has history of Asthma Denies history of Aspiration, Chronic Obstructive Pulmonary Disease (COPD), Pneumothorax, Sleep Apnea, Tuberculosis Cardiovascular Patient has history of Congestive Heart Failure, Coronary Artery Disease, Hypertension, Peripheral Arterial Disease, Peripheral Venous Disease Denies history of Angina, Arrhythmia, Hypotension, Myocardial Infarction, Phlebitis, Vasculitis Gastrointestinal Denies history of Hepatitis A, Hepatitis B, Hepatitis C Endocrine Patient has history of Type II Diabetes Denies history of Type I Diabetes Genitourinary Denies history of End Stage Renal Disease Immunological Denies history of Lupus Erythematosus, Raynaudoos, Scleroderma Integumentary (Skin) Denies history of History of Burn Musculoskeletal Patient has history of Rheumatoid Arthritis Denies history of Gout, Osteoarthritis, Osteomyelitis Neurologic Patient has history of  Neuropathy Denies history of Dementia, Quadriplegia, Paraplegia, Seizure Disorder Oncologic Denies history of Received Chemotherapy, Received Radiation Psychiatric Denies history of Anorexia/bulimia, Confinement Anxiety Hospitalization/Surgery History - 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 Lajoyce Corners 01/2020. 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 - 11/22 R AKA. Medical A Surgical History Notes nd Constitutional Symptoms (General Health) hypothyroidism Cardiovascular cardiac stents Gastrointestinal GERD Endocrine Hypothyroidism Immunological Mix connective tissue disease- autoimmune Sjogren's syndrome Musculoskeletal Fibromyalgia, Scoliosis, Monks, Zuly M ([id number]) [id number] Page 10 of 14 Objective Constitutional no acute distress. Vitals Time Taken: 10:16 AM, Height: 62 in, Weight: 284 lbs, BMI: 51.9, Temperature: 98.1 F, Pulse: 83 bpm, Respiratory Rate: 18 breaths/min, Blood Pressure: 134/73 mmHg, Capillary Blood Glucose: 179 mg/dl. Respiratory Normal work of breathing on room air. General Notes: 12/26/2022: The wound on her plantar left foot is smaller and the bridge of skin between the 2 sections is getting wider. There is a little bit of eschar around the edges and thin slough on the surface. She has wounds on her pubis and on the distal portion of her AKA stump as well as a wound on her mid abdomen. The pubis and AKA stump wounds look as though they are secondary to friction or pressure. The wound in her mid abdomen is of unclear etiology. It is quite dry. She has moisture-related tissue breakdown in her intertriginous folds and there is a strong smell of yeast coming from her groin region. Integumentary (Hair, Skin) Wound #19 status is Open. Original cause of wound was Gradually Appeared. The date acquired was: 05/17/2022. The wound has been in treatment 22 weeks. The wound is located on the Left,Plantar Foot. The wound measures 10cm  length x 3cm width x 0.2cm depth; 23.562cm^2 area and 4.712cm^3 volume. There is Fat Layer (Subcutaneous Tissue) exposed. There is no tunneling or undermining noted. There is a medium amount of serosanguineous drainage noted. The wound margin is distinct with the outline attached to the wound base. There is large (67-100%) pink granulation within the wound bed. There is a small (1-33%) amount of necrotic tissue within  the wound bed including Adherent Slough. The periwound skin appearance had no abnormalities noted for moisture. The periwound skin appearance exhibited: Callus, Scarring. The periwound skin appearance did not exhibit: Crepitus, Excoriation, Induration, Rash, Atrophie Blanche, Cyanosis, Ecchymosis, Hemosiderin Staining, Mottled, Pallor, Rubor, Erythema. Periwound temperature was noted as No Abnormality. Wound #20 status is Open. Original cause of wound was Pressure Injury. The date acquired was: 12/14/2022. The wound is located on the Right Amputation Site - Above Knee. The wound measures 3cm length x 8cm width x 0.1cm depth; 18.85cm^2 area and 1.885cm^3 volume. There is Fat Layer (Subcutaneous Tissue) exposed. There is no tunneling or undermining noted. There is a medium amount of serosanguineous drainage noted. There is large (67-100%) red granulation within the wound bed. There is a small (1-33%) amount of necrotic tissue within the wound bed including Adherent Slough. The periwound skin appearance had no abnormalities noted for moisture. The periwound skin appearance had no abnormalities noted for color. The periwound skin appearance exhibited: Scarring. Wound #21 status is Open. Original cause of wound was Pressure Injury. The date acquired was: 12/14/2022. The wound is located on the Medial Pubis. The wound measures 8cm length x 6.5cm width x 0.1cm depth; 40.841cm^2 area and 4.084cm^3 volume. There is Fat Layer (Subcutaneous Tissue) exposed. There is a medium amount of serosanguineous  drainage noted. There is large (67-100%) red granulation within the wound bed. There is a small (1-33%) amount of necrotic tissue within the wound bed including Adherent Slough. The periwound skin appearance had no abnormalities noted for moisture. The periwound skin appearance had no abnormalities noted for color. The periwound skin appearance exhibited: Scarring. Periwound temperature was noted as No Abnormality. Wound #22 status is Open. Original cause of wound was Pressure Injury. The date acquired was: 12/14/2022. The wound is located on the Left,Medial Upper Leg. The wound measures 5cm length x 5cm width x 0.1cm depth; 19.635cm^2 area and 1.963cm^3 volume. There is Fat Layer (Subcutaneous Tissue) exposed. There is no tunneling or undermining noted. There is large (67-100%) red granulation within the wound bed. There is a small (1-33%) amount of necrotic tissue within the wound bed including Adherent Slough. The periwound skin appearance had no abnormalities noted for moisture. The periwound skin appearance had no abnormalities noted for color. The periwound skin appearance exhibited: Scarring. Periwound temperature was noted as No Abnormality. Wound #23 status is Open. Original cause of wound was Other Lesion. The date acquired was: 12/14/2022. The wound is located on the Midline Umbilicus. The wound measures 10cm length x 6cm width x 0.1cm depth; 47.124cm^2 area and 4.712cm^3 volume. There is Fat Layer (Subcutaneous Tissue) exposed. There is no tunneling or undermining noted. There is a medium amount of serosanguineous drainage noted. There is medium (34-66%) pink granulation within the wound bed. There is a medium (34-66%) amount of necrotic tissue within the wound bed. The periwound skin appearance had no abnormalities noted for texture. The periwound skin appearance had no abnormalities noted for moisture. The periwound skin appearance had no abnormalities noted for color. Periwound temperature  was noted as No Abnormality. Wound #24 status is Open. Original cause of wound was Other Lesion. The date acquired was: 12/14/2022. The wound is located on the Abdomen - midline. The wound measures 1cm length x 3cm width x 0.1cm depth; 2.356cm^2 area and 0.236cm^3 volume. There is Fat Layer (Subcutaneous Tissue) exposed. There is no tunneling or undermining noted. There is a medium amount of serosanguineous drainage noted. There is large (67-100%) red granulation within  the wound bed. There is a small (1-33%) amount of necrotic tissue within the wound bed including Adherent Slough. The periwound skin appearance had no abnormalities noted for texture. The periwound skin appearance had no abnormalities noted for moisture. The periwound skin appearance had no abnormalities noted for color. Periwound temperature was noted as No Abnormality. 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 There was a Selective/Open Wound Non-Viable Tissue Debridement with a total area of 30 sq cm performed by Duanne Guess, MD. With the following instrument(s): Curette to  remove Non-Viable tissue/material. Material removed includes Callus and Slough and after achieving pain control using Lidocaine 4% T opical Solution. No specimens were taken. A time out was conducted at 11:14, prior to the start of the procedure. A Minimum amount of bleeding was controlled with Pressure. The procedure was tolerated well with a pain level of 0 throughout and a pain level of 0 following the procedure. Post Debridement Measurements: 10cm length x 3cm width x 0.2cm depth; 4.712cm^3 volume. Character of Wound/Ulcer Post Debridement is improved. Severity of Tissue Post Debridement is: Fat layer exposed. Post procedure Diagnosis Wound #19: Same as Pre-Procedure General Notes: Scribed for Dr. Lady Gary by J.Scotton. Plan Follow-up Appointments: Return Appointment in 1 week. - **** EXTRA TIME at least 45 mins COMPLEX patient**** Dr. Lady Gary Room 3 Other: - over the counter antifungal spray Anesthetic: (In clinic) Topical Lidocaine 5% applied to wound bed Bathing/ Shower/ Hygiene: May shower and wash wound with soap and water. - with dressing changes Edema Control - Lymphedema / SCD / Other: Elevate legs to the level of the heart or above for 30 minutes daily and/or when sitting for 3-4 times a day throughout the day. Avoid standing for long periods of time. Patient to wear own compression stockings every day. Off-Loading: Other: - Try and elevate Left leg throughout the day WOUND #19: - Foot Wound Laterality: Plantar, Left Cleanser: Soap and Water 1 x Per Day/30 Days Discharge Instructions: May shower and wash wound with dial antibacterial soap and water prior to dressing change. Cleanser: Wound Cleanser (Generic) 1 x Per Day/30 Days Discharge Instructions: Cleanse the wound with wound cleanser prior to applying a clean dressing using gauze sponges, not tissue or cotton balls. Prim Dressing: Maxorb Extra Ag+ Alginate Dressing, 4x4.75 (in/in) (Generic) 1 x Per Day/30 Days ary Discharge  Instructions: Apply to wound bed as instructed Secondary Dressing: ABD Pad, 5x9 (Generic) 1 x Per Day/30 Days Discharge Instructions: Apply over primary dressing as directed. Secondary Dressing: Woven Gauze Sponge, Non-Sterile 4x4 in (Generic) 1 x Per Day/30 Days Discharge Instructions: Apply over primary dressing as directed. Secured With: Elastic Bandage 4 inch (ACE bandage) (Generic) 1 x Per Day/30 Days Discharge Instructions: Secure with ACE bandage as directed. Secured With: [employer], 4.5x3.1 (in/yd) (Generic)  1 x Per Day/30 Days Discharge Instructions: Secure with Kerlix as directed. Secured With: 49M Medipore Scientist, research (life sciences) Surgical T 2x10 (in/yd) (Generic) 1 x Per Day/30 Days ape Discharge Instructions: Secure with tape as directed. WOUND #20: - Amputation Site - Above Knee Wound Laterality: Right Prim Dressing: Maxorb Extra CMC/Alginate Dressing, 4x4 (in/in) 1 x Per Day/30 Days ary Discharge Instructions: Apply to wound bed as instructed Secondary Dressing: ALLEVYN Gentle Border, 5x5 (in/in) 1 x Per Day/30 Days Discharge Instructions: Apply over primary dressing as directed. WOUND #21: - Pubis Wound Laterality: Medial Prim Dressing: Maxorb Extra Ag+ Alginate Dressing, 4x4.75 (in/in) Every Other Day/30 Days ary Discharge Instructions: Apply to wound bed as instructed Secondary Dressing: ABD Pad, 8x10 Every Other Day/30 Days Discharge Instructions: Apply over primary dressing as directed. WOUND #22: - Upper Leg Wound Laterality: Left, Medial Prim Dressing: Maxorb Extra Ag+ Alginate Dressing, 4x4.75 (in/in) 1 x Per Day/30 Days ary Discharge Instructions: Apply to wound bed as instructed Secondary Dressing: ABD Pad, 8x10 1 x Per Day/30 Days Discharge Instructions: Apply over primary dressing as directed. Secured With: 49M Medipore H Soft Cloth Surgical T ape, 4 x 10 (in/yd) 1 x Per Day/30 Days Discharge Instructions: Secure with tape as directed. WOUND #23: - Umbilicus Wound  Laterality: Midline Prim Dressing: MediHoney Gel, tube 1.5 (oz) 1 x Per Day/30 Days ary Discharge Instructions: Apply to wound bed as instructed Secondary Dressing: ABD Pad, 8x10 1 x Per Day/30 Days Discharge Instructions: Apply over primary dressing as directed. Secured With: 49M Medipore H Soft Cloth Surgical T ape, 4 x 10 (in/yd) 1 x Per Day/30 Days Discharge Instructions: Secure with tape as directed. WOUND #24: - Abdomen - midline Wound Laterality: Topical: Ketoconazole Cream 2% 1 x Per Day/30 Days Discharge Instructions: Apply Ketoconazole as directed Topical: Interdry 1 x Per Day/30 Days Discharge Instructions: Apply to under Pannus 12/26/2022: The wound on her plantar left foot is smaller and the bridge of skin between the 2 sections is getting wider. There is a little bit of eschar around the edges and thin slough on the surface. Unfortunately, she has multiple additional wounds that have actually been present for some time but she only just now Balzarini, Chianna M ([id number]) [id number] Page 12 of 14 is bringing them to our attention. She has wounds on her pubis and on the distal portion of her AKA stump as well as a wound on her mid abdomen. The pubis and AKA stump wounds look as though they are secondary to friction or pressure. The wound in her mid abdomen is of unclear etiology. It is quite dry. She has moisture-related tissue breakdown in her intertriginous folds and there is a strong smell of yeast coming from her groin region. I used a curette to debride callus and slough from her foot wound. We will continue to use her Keystone topical antibiotic compound (prescription drug) and silver alginate here. The abdominal wound was too tender to debride. It is quite dry and we will have her use Medihoney to try and soften up the dry tissue and eschar here. The other new wounds did not require debridement. We will put silver alginate on all of these. I am not sure  how to best secure these dressings, as tape causes trauma to her skin. She also needs to try to offload but as she is essentially spherical in shape, this may be somewhat difficult. I suggested perhaps mesh underpants, such as are used post delivery and post anorectal surgery and she thought  that might be an option for her. Her edema is truly out of control and I am not sure what the solution is for this given her limited mobility and the fact that much of the edema is concentrated in her panniculus. They will follow-up in 1 week so that we can monitor these wounds more closely. Electronic Signature(s) Signed: 12/26/2022 12:52:52 PM By: Duanne Guess MD FACS Entered By: Duanne Guess on 12/26/2022 12:52:51 -------------------------------------------------------------------------------- HxROS Details Patient Name: Date of Service: Branscom, [name] 12/26/2022 10:15 A M Medical Record Number: [id number] Patient Account Number: [id number] Date of Birth/Sex: Treating RN: 03-25-1961 (62 y.o. F) Primary Care Provider: [name] Other Clinician: Referring Provider: Treating Provider/Extender: Vivien Rossetti in Treatment: 22 Information Obtained From Patient Constitutional Symptoms (General Health) Medical History: Past Medical History Notes: hypothyroidism Eyes Medical History: Positive for: Cataracts - both eyes Negative for: Glaucoma; Optic Neuritis Ear/Nose/Mouth/Throat Medical History: Negative for: Chronic sinus problems/congestion; Middle ear problems Hematologic/Lymphatic Medical History: Positive for: Lymphedema Negative for: Anemia; Hemophilia; Human Immunodeficiency Virus; Sickle Cell Disease Respiratory Medical History: Positive for: Asthma Negative for: Aspiration; Chronic Obstructive Pulmonary Disease (COPD); Pneumothorax; Sleep Apnea; Tuberculosis Cardiovascular Medical History: Positive for: Congestive Heart Failure; Coronary  Artery Disease; Hypertension; Peripheral Arterial Disease; Peripheral Venous Disease Negative for: Angina; Arrhythmia; Hypotension; Myocardial Infarction; Phlebitis; Vasculitis Past Medical History Notes: cardiac stents Gastrointestinal Medical History: Negative for: Hepatitis A; Hepatitis B; Hepatitis C Past Medical History Notes: GERD Endocrine Conteh, Joanne M ([id number]) [id number] Page 13 of 14 Medical History: Positive for: Type II Diabetes Negative for: Type I Diabetes Past Medical History Notes: Hypothyroidism Time with diabetes: since 2002 Treated with: Insulin, Oral agents Blood sugar tested every day: Yes Tested : 2-3 times a day Genitourinary Medical History: Negative for: End Stage Renal Disease Immunological Medical History: Negative for: Lupus Erythematosus; Raynauds; Scleroderma 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 Integumentary (Skin) Medical History: Negative for: History of Burn Musculoskeletal Medical History: Positive for: Rheumatoid Arthritis Negative for: Gout; Osteoarthritis; Osteomyelitis Past Medical History Notes: Fibromyalgia, Scoliosis, Neurologic Medical History: Positive for: Neuropathy Negative for: Dementia; Quadriplegia; Paraplegia; Seizure Disorder Oncologic Medical History: Negative for: Received Chemotherapy; Received Radiation Psychiatric Medical History: Negative for: Anorexia/bulimia; Confinement Anxiety HBO Extended History Items Eyes: Cataracts Immunizations Pneumococcal Vaccine: Received Pneumococcal Vaccination: Yes Received Pneumococcal Vaccination On or After 60th Birthday: Yes Implantable Devices None Hospitalization / Surgery History Type of Hospitalization/Surgery 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 Family and Social History Cancer: No; Diabetes: Yes - Mother;  Heart Disease: Yes - Mother,Father,Siblings; Hereditary Spherocytosis: No; Hypertension: Yes - Mother,Father,Siblings; Kidney Disease: No; Lung Disease: Yes - Father; Seizures: No; Stroke: Yes - Mother; Thyroid Problems: No; Tuberculosis: No; Former smoker - quit 15 years ago; Marital Status - Married; Alcohol Use: Never; Drug Use: No History; Caffeine Use: Rarely; Financial Concerns: No; Food, Clothing or Shelter Needs: No; Support System Lacking: No; Transportation Concerns: No Ure, Suan M ([id number]) [id number] Page 14 of 14 Electronic Signature(s) Signed: 12/26/2022 12:56:28 PM By: Duanne Guess MD FACS Entered By: Duanne Guess on 12/26/2022 12:47:44 -------------------------------------------------------------------------------- SuperBill Details Patient Name: Date of Service: Bell, [name] 12/26/2022 Medical Record Number: [id number] Patient Account Number: [id number] Date of Birth/Sex: Treating RN: 1960/12/05 (62 y.o. 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 L30.4 Erythema intertrigo L97.112 Non-pressure chronic ulcer of right thigh with fat layer exposed L98.492 Non-pressure chronic ulcer of skin of other sites with fat layer exposed L97.822 Non-pressure chronic ulcer of other part of left lower leg with fat layer exposed M86.9 Osteomyelitis, unspecified E11.621 Type 2 diabetes mellitus with foot ulcer E66.01 Morbid (severe) obesity due to excess calories M35.00 Sjogren syndrome, unspecified I73.9 Peripheral vascular disease, unspecified I50.32 Chronic diastolic (congestive) heart failure 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 Facility Procedures : 7 CPT4 Code:  [id number] Description: 97597 - DEBRIDE WOUND 1ST 20 SQ CM OR < ICD-10 Diagnosis Description 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 Modifier: Quantity: 1 : 7 CPT4 Code: [id number] Description: 97598 - DEBRIDE WOUND EA ADDL 20 SQ CM ICD-10 Diagnosis Description 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 Modifier: Quantity: 1 Physician Procedures : CPT4 Code Description Modifier [id number] - WC PHYS LEVEL 4 - EST PT 25 ICD-10 Diagnosis Description 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 L97.112 Non-pressure chronic ulcer of right thigh with fat layer  exposed L98.492 Non-pressure chronic ulcer of skin of other sites with fat layer exposed L97.822 Non-pressure chronic ulcer of other part of left lower leg with fat layer exposed Quantity: 1 : [id number] - WC PHYS DEBR WO ANESTH 20 SQ CM ICD-10 Diagnosis Description 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

## 2022-12-26 NOTE — Progress Notes (Signed)
Bieser, Sieara M ([id number]) [id number] Page 1 of 14 Visit Report for 12/26/2022 Arrival Information Details Patient Name: Date of Service: Deutscher, [name] 12/26/2022 10:15 A M Medical Record Number: [id number] Patient Account Number: [id number] Date of Birth/Sex: Treating RN: 1961-01-11 (62 y.o. F) Primary Care Liboria Putnam: [name] Other Clinician: Referring Haig Gerardo: Treating Abbee Cremeens/Extender: Vivien Rossetti in Treatment: 22 Visit Information History Since Last Visit All ordered tests and consults were completed: No Patient Arrived: Wheel Chair Added or deleted any medications: No Arrival Time: 10:15 Any new allergies or adverse reactions: No Accompanied By: husband Had a fall or experienced change in No Transfer Assistance: None activities of daily living that may affect Patient Identification Verified: Yes risk of falls: Secondary Verification Process Completed: Yes Signs or symptoms of abuse/neglect since last visito No Patient Requires Transmission-Based Precautions: No Hospitalized since last visit: No Patient Has Alerts: No Implantable device outside of the clinic excluding No cellular tissue based products placed in the center since last visit: Pain Present Now: No Electronic Signature(s) Signed: 12/26/2022 12:57:04 PM By: Dayton Scrape Entered By: Dayton Scrape on 12/26/2022 10:16:19 -------------------------------------------------------------------------------- Encounter Discharge Information Details Patient Name: Date of Service: Sinor, [name] 12/26/2022 10:15 A M Medical Record Number: [id number] Patient Account Number: [id number] Date of Birth/Sex: Treating RN: 09/06/1961 (62 y.o. [name] Primary Care Vang Kraeger: [name] Other Clinician: Referring Reza Crymes: Treating Keiyana Stehr/Extender: Vivien Rossetti in Treatment: 22 Encounter Discharge Information  Items Post Procedure Vitals Discharge Condition: Stable Temperature (F): 98.1 Ambulatory Status: Wheelchair Pulse (bpm): 83 Discharge Destination: Home Respiratory Rate (breaths/min): 18 Transportation: Private Auto Blood Pressure (mmHg): 134/73 Accompanied By: spouse Schedule Follow-up Appointment: Yes Clinical Summary of Care: Patient Declined Electronic Signature(s) Signed: 12/26/2022 3:54:46 PM By: Karie Schwalbe RN Entered By: Karie Schwalbe on 12/26/2022 15:53:55 -------------------------------------------------------------------------------- Lower Extremity Assessment Details Patient Name: Date of Service: Bonaventura, [name] 12/26/2022 10:15 A M Medical Record Number: [id number] Patient Account Number: [id number] Date of Birth/Sex: Treating RN: 1961-03-25 (62 y.o. [name] Primary Care Dimarco Minkin: [name] Other Clinician: Referring Myla Mauriello: Treating Fiana Gladu/Extender: Vivien Rossetti in Treatment: 22 Edema Assessment Assessed: Kyra Searles: No] [Right: No] R[LeftKHILA, PAPP ([id number])] [Right: [id number] Page 2 of 14] Edema: [Left: Ye] [Right: s] Calf Left: Right: Point of Measurement: 30 cm From Medial Instep 47 cm Ankle Left: Right: Point of Measurement: 9 cm From Medial Instep 25.5 cm Vascular Assessment Pulses: Dorsalis Pedis Palpable: [Left:Yes] Electronic Signature(s) Signed: 12/26/2022 3:54:46 PM By: Karie Schwalbe RN Entered By: Karie Schwalbe on 12/26/2022 11:10:59 -------------------------------------------------------------------------------- Multi Wound Chart Details Patient Name: Date of Service: Berton, [name] 12/26/2022 10:15 A M Medical Record Number: [id number] Patient Account Number: [id number] Date of Birth/Sex: Treating RN: 29-Aug-1961 (62 y.o. 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 Ulcer Primary Etiology: Extremity Cataracts, Lymphedema, Asthma, Cataracts, Lymphedema, Asthma, Cataracts, Lymphedema, Asthma, Comorbid History: Congestive Heart Failure, Coronary Congestive Heart Failure, Coronary Congestive Heart Failure, Coronary Artery Disease, Hypertension, Artery Disease, Hypertension, Artery Disease, Hypertension, Peripheral Arterial Disease, Peripheral Peripheral Arterial Disease, Peripheral Peripheral Arterial Disease, Peripheral  Venous Disease, Type II Diabetes, Venous Disease, Type II Diabetes, Venous Disease, Type II Diabetes, Rheumatoid Arthritis, Neuropathy Rheumatoid Arthritis, Neuropathy Rheumatoid Arthritis, Neuropathy 05/17/2022 12/14/2022 12/14/2022 Date Acquired: 22 0 0 Weeks of Treatment: Open Open Open Wound Status: No No No Wound Recurrence: No Yes Yes Clustered Wound: 10x3x0.2 3x8x0.1 8x6.5x0.1 Measurements L x W x D (cm) 23.562 18.85 40.841 A (cm) : rea 4.712 1.885 4.084 Volume (cm) : -4.90% N/A N/A % Reduction in A rea: 47.60% N/A N/A % Reduction in Volume: Grade 2 Category/Stage II Category/Stage II Classification: Medium Medium Medium Exudate A mount: Serosanguineous Serosanguineous Serosanguineous Exudate Type: red, brown red, brown red, brown Exudate Color: Distinct, outline attached N/A N/A Wound Margin: Dallaire, Miachel Roux ([id number]) [id number] Page 3 of 14 Large (67-100%) Large (67-100%) Large (67-100%) Granulation  Amount: Pink Red Red Granulation Quality: Small (1-33%) Small (1-33%) Small (1-33%) Necrotic Amount: Fat Layer (Subcutaneous Tissue): Yes Fat Layer (Subcutaneous Tissue): Yes Fat Layer (Subcutaneous Tissue): Yes Exposed Structures: Fascia: No Fascia: No Fascia: No Tendon: No Tendon: No Tendon: No Muscle: No Muscle: No Muscle: No Joint: No Joint: No Joint: No Bone: No Bone: No Bone: No Medium (34-66%) None N/A Epithelialization: Debridement - Selective/Open Wound N/A N/A Debridement: Pre-procedure Verification/Time Out 11:14 N/A N/A Taken: Lidocaine 4% T opical Solution N/A N/A Pain Control: Callus, Slough N/A N/A Tissue Debrided: Non-Viable Tissue N/A N/A Level: 30 N/A N/A Debridement A (sq cm): rea Curette N/A N/A Instrument: Minimum N/A N/A Bleeding: Pressure N/A N/A Hemostasis A chieved: 0 N/A N/A Procedural Pain: 0 N/A N/A Post Procedural Pain: Procedure was tolerated well N/A N/A Debridement Treatment Response: 10x3x0.2 N/A N/A Post Debridement Measurements L x W x D (cm) 4.712 N/A N/A Post Debridement Volume: (cm) Callus: Yes Scarring: Yes Scarring: Yes Periwound Skin Texture: Scarring: Yes Excoriation: No Induration: No Crepitus: No Rash: No Dry/Scaly: Yes No Abnormalities Noted No Abnormalities Noted Periwound Skin Moisture: Maceration: No Atrophie Blanche: No No Abnormalities Noted No Abnormalities Noted Periwound Skin Color: Cyanosis: No Ecchymosis: No Erythema: No Hemosiderin Staining: No Mottled: No Pallor: No Rubor: No No Abnormality N/A No Abnormality Temperature: Debridement N/A N/A Procedures Performed: Wound Number: Photos: Left, Medial Upper Leg Midline Umbilicus Abdomen - midline Wound Location: Pressure Injury Other Lesion Other Lesion Wounding Event: Pressure Ulcer Abrasion Lesion Primary Etiology: Cataracts, Lymphedema, Asthma, Cataracts, Lymphedema, Asthma, Cataracts, Lymphedema, Asthma, Comorbid  History: Congestive Heart Failure, Coronary Congestive Heart Failure, Coronary Congestive Heart Failure, Coronary Artery Disease, Hypertension, Artery Disease, Hypertension, Artery Disease, Hypertension, Peripheral Arterial Disease, Peripheral Peripheral Arterial Disease, Peripheral Peripheral Arterial Disease, Peripheral Venous Disease, Type II Diabetes, Venous Disease, Type II Diabetes, Venous Disease, Type II Diabetes, Rheumatoid Arthritis, Neuropathy Rheumatoid Arthritis, Neuropathy Rheumatoid Arthritis, Neuropathy 12/14/2022 12/14/2022 12/14/2022 Date Acquired: 0 0 0 Weeks of Treatment: Open Open Open Wound Status: No No No Wound Recurrence: Yes Yes Yes Clustered Wound: 5x5x0.1 10x6x0.1 1x3x0.1 Measurements L x W x D (cm) 19.635 47.124 2.356 A (cm) : rea 1.963 4.712 0.236 Volume (cm) : N/A N/A N/A % Reduction in Area: N/A N/A N/A % Reduction in Volume: Category/Stage II Full Thickness Without Exposed Full Thickness Without Exposed Classification: Support Structures Support Structures N/A Medium Medium Exudate Amount: N/A Serosanguineous Serosanguineous Exudate Type: N/A red, brown red, brown Exudate Color: N/A N/A N/A Wound Margin: Large (67-100%) Medium (34-66%) Large (67-100%) Granulation Amount: Red Pink Red Granulation Quality: Small (1-33%) Medium (34-66%) Small (1-33%) Necrotic Amount: Fat Layer (Subcutaneous Tissue): Yes Fat Layer (Subcutaneous Tissue): Yes  Fat Layer (Subcutaneous Tissue): Yes Exposed Structures: Fascia: No Fascia: No Fascia: No Tendon: No Tendon: No Tendon: No Mierzejewski, [place] M ([id number]) [id number] Page 4 of 14 Muscle: No Muscle: No Muscle: No Joint: No Joint: No Joint: No Bone: No Bone: No Bone: No None N/A None Epithelialization: N/A N/A N/A Debridement: N/A N/A N/A Pain Control: N/A N/A N/A Tissue Debrided: N/A N/A N/A Level: N/A N/A N/A Debridement A (sq cm): rea N/A N/A  N/A Instrument: N/A N/A N/A Bleeding: N/A N/A N/A Hemostasis A chieved: N/A N/A N/A Procedural Pain: N/A N/A N/A Post Procedural Pain: Debridement Treatment Response: N/A N/A N/A Post Debridement Measurements L x N/A N/A N/A W x D (cm) N/A N/A N/A Post Debridement Volume: (cm) Scarring: Yes No Abnormalities Noted No Abnormalities Noted Periwound Skin Texture: No Abnormalities Noted No Abnormalities Noted No Abnormalities Noted Periwound Skin Moisture: No Abnormalities Noted No Abnormalities Noted No Abnormalities Noted Periwound Skin Color: No Abnormality No Abnormality No Abnormality Temperature: N/A N/A N/A Procedures Performed: Treatment Notes Electronic Signature(s) Signed: 12/26/2022 12:30:13 PM By: Duanne Guess MD FACS Entered By: Duanne Guess on 12/26/2022 12:30:13 -------------------------------------------------------------------------------- Multi-Disciplinary Care Plan Details Patient Name: Date of Service: Wearing, [name] 12/26/2022 10:15 A M Medical Record Number: [id number] Patient Account Number: [id number] Date of Birth/Sex: Treating RN: 1960-12-28 (62 y.o. [name] Primary Care Denarius Sesler: [name] Other Clinician: Referring Genavie Boettger: Treating Terry Abila/Extender: Vivien Rossetti in Treatment: 22 Multidisciplinary Care Plan reviewed with physician Active Inactive Venous Leg Ulcer Nursing Diagnoses: Potential for venous Insuffiency (use before diagnosis confirmed) Goals: Patient will maintain optimal edema control Date Initiated: 07/19/2022 Target Resolution Date: 01/11/2026 Goal Status: Active Interventions: Assess peripheral edema status every visit. Treatment Activities: Therapeutic compression applied : 07/19/2022 Notes: Wound/Skin Impairment Nursing Diagnoses: Impaired tissue integrity Knowledge deficit related to ulceration/compromised skin integrity Goals: Patient/caregiver will verbalize  understanding of skin care regimen Date Initiated: 11/14/2022 Target Resolution Date: 01/11/2026 Goal Status: Active Interventions: Fosnaugh, Smantha M ([id number]) [id number] Page 5 of 14 Assess patient/caregiver ability to obtain necessary supplies Assess patient/caregiver ability to perform ulcer/skin care regimen upon admission and as needed Assess ulceration(s) every visit Treatment Activities: Skin care regimen initiated : 11/14/2022 Topical wound management initiated : 11/14/2022 Notes: Electronic Signature(s) Signed: 12/26/2022 3:54:46 PM By: Karie Schwalbe RN Entered By: Karie Schwalbe on 12/26/2022 15:51:38 -------------------------------------------------------------------------------- Pain Assessment Details Patient Name: Date of Service: Smead, [name] 12/26/2022 10:15 A M Medical Record Number: [id number] Patient Account Number: [id number] Date of Birth/Sex: Treating RN: [birth year] (62 y.o. F) Primary Care Starasia Sinko: [name] Other Clinician: Referring Brigham Cobbins: Treating Gerrianne Aydelott/Extender: Vivien Rossetti in Treatment: 22 Active Problems Location of Pain Severity and Description of Pain Patient Has Paino Yes Site Locations Rate the pain. Current Pain Level: 7 Worst Pain Level: 10 Least Pain Level: 0 Tolerable Pain Level: 2 Pain Management and Medication Current Pain Management: Electronic Signature(s) Signed: 12/26/2022 12:57:04 PM By: Dayton Scrape Entered By: Dayton Scrape on 12/26/2022 10:17:48 -------------------------------------------------------------------------------- Patient/Caregiver Education Details Patient Name: Date of Service: Coover, [name] 4/16/2024andnbsp10:15 A M Medical Record Number: [id number] Patient Account Number: [id number] Date of Birth/Gender: Treating RN: Feb 06, 1961 (62 y.o. [name] Primary Care Physician: [name] Other Clinician: Referring  Physician: Treating Physician/Extender: Vivien Rossetti in Treatment: 22 Education Assessment Education Provided To: Darling, Miachel Roux ([id number]) [id number] Page 6 of 14 Patient Education Topics Provided Wound/Skin Impairment: Methods: Explain/Verbal Responses: Return demonstration correctly Electronic Signature(s) Signed: 12/26/2022 3:54:46  PM By: Karie Schwalbe RN Entered By: Karie Schwalbe on 12/26/2022 15:51:55 -------------------------------------------------------------------------------- Wound Assessment Details Patient Name: Date of Service: Hehl, [name] 12/26/2022 10:15 A M Medical Record Number: [id number] Patient Account Number: [id number] Date of Birth/Sex: Treating RN: 07-06-1961 (62 y.o. F) Primary Care Quill Grinder: [name] Other Clinician: Referring Chrystine Frogge: Treating Neylan Koroma/Extender: Vivien Rossetti in Treatment: 22 Wound Status Wound Number: 19 Primary Diabetic Wound/Ulcer of the Lower Extremity Etiology: Wound Location: Left, Plantar Foot Wound Open Wounding Event: Gradually Appeared Status: Date Acquired: 05/17/2022 Comorbid Cataracts, Lymphedema, Asthma, Congestive Heart Failure, Weeks Of Treatment: 22 History: Coronary Artery Disease, Hypertension, Peripheral Arterial Disease, Clustered Wound: No Peripheral Venous Disease, Type II Diabetes, Rheumatoid Arthritis, Neuropathy Photos Wound Measurements Length: (cm) 10 Width: (cm) 3 Depth: (cm) 0.2 Area: (cm) 23.562 Volume: (cm) 4.712 % Reduction in Area: -4.9% % Reduction in Volume: 47.6% Epithelialization: Medium (34-66%) Tunneling: No Undermining: No Wound Description Classification: Grade 2 Wound Margin: Distinct, outline attached Exudate Amount: Medium Exudate Type: Serosanguineous Exudate Color: red, brown Foul Odor After Cleansing: No Slough/Fibrino Yes Wound Bed Granulation Amount: Large  (67-100%) Exposed Structure Granulation Quality: Pink Fascia Exposed: No Necrotic Amount: Small (1-33%) Fat Layer (Subcutaneous Tissue) Exposed: Yes Necrotic Quality: Adherent Slough Tendon Exposed: No Muscle Exposed: No Joint Exposed: No Bone Exposed: No Periwound Skin Texture Texture Color Gloeckner, Mekaela M ([id number]) [id number] Page 7 of 14 No Abnormalities Noted: No No Abnormalities Noted: No Callus: Yes Atrophie Blanche: No Crepitus: No Cyanosis: No Excoriation: No Ecchymosis: No Induration: No Erythema: No Rash: No Hemosiderin Staining: No Scarring: Yes Mottled: No Pallor: No Moisture Rubor: No No Abnormalities Noted: Yes Temperature / Pain Temperature: No Abnormality Treatment Notes Wound #19 (Foot) Wound Laterality: Plantar, Left Cleanser Soap and Water Discharge Instruction: May shower and wash wound with dial antibacterial soap and water prior to dressing change. Wound Cleanser Discharge Instruction: Cleanse the wound with wound cleanser prior to applying a clean dressing using gauze sponges, not tissue or cotton balls. Peri-Wound Care Topical Primary Dressing Maxorb Extra Ag+ Alginate Dressing, 4x4.75 (in/in) Discharge Instruction: Apply to wound bed as instructed Secondary Dressing ABD Pad, 5x9 Discharge Instruction: Apply over primary dressing as directed. Woven Gauze Sponge, Non-Sterile 4x4 in Discharge Instruction: Apply over primary dressing as directed. Secured With Elastic Bandage 4 inch (ACE bandage) Discharge Instruction: Secure with ACE bandage as directed. Kerlix Roll Sterile, 4.5x3.1 (in/yd) Discharge Instruction: Secure with Kerlix as directed. 6M Medipore Soft Cloth Surgical T 2x10 (in/yd) ape Discharge Instruction: Secure with tape as directed. Compression Wrap Compression Stockings Add-Ons Electronic Signature(s) Signed: 12/26/2022 3:54:46 PM By: Karie Schwalbe RN Entered By: Karie Schwalbe on  12/26/2022 10:58:19 -------------------------------------------------------------------------------- Wound Assessment Details Patient Name: Date of Service: Dlugosz, [name] 12/26/2022 10:15 A M Medical Record Number: [id number] Patient Account Number: [id number] Date of Birth/Sex: Treating RN: [birth year] (62 y.o. [name] Primary Care Rylynn Schoneman: [name] Other Clinician: Referring Orpah Hausner: Treating Lahari Suttles/Extender: Vivien Rossetti in Treatment: 22 Wound Status Wound Number: 20 Primary Pressure Ulcer Etiology: Wound Location: Right Amputation Site - Above Knee Wound Open Wounding Event: Pressure Injury Status: Date Acquired: 12/14/2022 Comorbid Cataracts, Lymphedema, Asthma, Congestive Heart Failure, Weeks Of Treatment: 0 History: Coronary Artery Disease, Hypertension, Peripheral Arterial Disease, Clustered Wound: Yes Peripheral Venous Disease, Type II Diabetes, Rheumatoid Arthritis, Neuropathy Peyser, Sharlotte M ([id number]) [id number] Page 8 of 14 Photos Wound Measurements Length: (cm) 3 Width: (cm) 8 Depth: (cm) 0.1 Area: (cm) 18.85 Volume: (cm) 1.885 % Reduction  in Area: % Reduction in Volume: Epithelialization: None Tunneling: No Undermining: No Wound Description Classification: Category/Stage II Exudate Amount: Medium Exudate Type: Serosanguineous Exudate Color: red, brown Wound Bed Granulation Amount: Large (67-100%) Exposed Structure Granulation Quality: Red Fascia Exposed: No Necrotic Amount: Small (1-33%) Fat Layer (Subcutaneous Tissue) Exposed: Yes Necrotic Quality: Adherent Slough Tendon Exposed: No Muscle Exposed: No Joint Exposed: No Bone Exposed: No Periwound Skin Texture Texture Color No Abnormalities Noted: No No Abnormalities Noted: Yes Scarring: Yes Moisture No Abnormalities Noted: Yes Treatment Notes Wound #20 (Amputation Site - Above Knee) Wound Laterality:  Right Cleanser Peri-Wound Care Topical Primary Dressing Maxorb Extra CMC/Alginate Dressing, 4x4 (in/in) Discharge Instruction: Apply to wound bed as instructed Secondary Dressing ALLEVYN Gentle Border, 5x5 (in/in) Discharge Instruction: Apply over primary dressing as directed. Secured With Compression Wrap Compression Stockings Add-Ons Electronic Signature(s) Signed: 12/26/2022 12:57:04 PM By: Dayton Scrape Signed: 12/26/2022 3:54:46 PM By: Karie Schwalbe RN Entered By: Dayton Scrape on 12/26/2022 10:52:30 Gravely, Stefani M ([id number]) [id number] Page 9 of 14 -------------------------------------------------------------------------------- Wound Assessment Details Patient Name: Date of Service: Domingos, [name] 12/26/2022 10:15 A M Medical Record Number: [id number] Patient Account Number: [id number] Date of Birth/Sex: Treating RN: 1961-08-07 (62 y.o. [name] Primary Care Kue Fox: [name] Other Clinician: Referring Garnett Rekowski: Treating Larron Armor/Extender: Vivien Rossetti in Treatment: 22 Wound Status Wound Number: 21 Primary Pressure Ulcer Etiology: Wound Location: Medial Pubis Wound Open Wounding Event: Pressure Injury Status: Date Acquired: 12/14/2022 Comorbid Cataracts, Lymphedema, Asthma, Congestive Heart Failure, Weeks Of Treatment: 0 History: Coronary Artery Disease, Hypertension, Peripheral Arterial Disease, Clustered Wound: Yes Peripheral Venous Disease, Type II Diabetes, Rheumatoid Arthritis, Neuropathy Photos Wound Measurements Length: (cm) Width: (cm) Depth: (cm) Area: (cm) Volume: (cm) 8 % Reduction in Area: 6.5 % Reduction in Volume: 0.1 40.841 4.084 Wound Description Classification: Category/Stage II Exudate Amount: Medium Exudate Type: Serosanguineous Exudate Color: red, brown Foul Odor After Cleansing: No Slough/Fibrino Yes Wound Bed Granulation Amount: Large (67-100%)  Exposed Structure Granulation Quality: Red Fascia Exposed: No Necrotic Amount: Small (1-33%) Fat Layer (Subcutaneous Tissue) Exposed: Yes Necrotic Quality: Adherent Slough Tendon Exposed: No Muscle Exposed: No Joint Exposed: No Bone Exposed: No Periwound Skin Texture Texture Color No Abnormalities Noted: No No Abnormalities Noted: Yes Scarring: Yes Temperature / Pain Temperature: No Abnormality Moisture No Abnormalities Noted: Yes Treatment Notes Wound #21 (Pubis) Wound Laterality: Medial Cleanser Peri-Wound Care Topical Lavalley, Rebecca M ([id number]) [id number] Page 10 of 14 Primary Dressing Maxorb Extra Ag+ Alginate Dressing, 4x4.75 (in/in) Discharge Instruction: Apply to wound bed as instructed Secondary Dressing ABD Pad, 8x10 Discharge Instruction: Apply over primary dressing as directed. Secured With Compression Wrap Compression Stockings Add-Ons Electronic Signature(s) Signed: 12/26/2022 12:57:04 PM By: Dayton Scrape Signed: 12/26/2022 3:54:46 PM By: Karie Schwalbe RN Entered By: Dayton Scrape on 12/26/2022 10:54:13 -------------------------------------------------------------------------------- Wound Assessment Details Patient Name: Date of Service: Baquero, [name] 12/26/2022 10:15 A M Medical Record Number: [id number] Patient Account Number: [id number] Date of Birth/Sex: Treating RN: 1960/10/07 (62 y.o. [name] Primary Care Cora Stetson: [name] Other Clinician: Referring Moise Friday: Treating Brennah Quraishi/Extender: Vivien Rossetti in Treatment: 22 Wound Status Wound Number: 22 Primary Pressure Ulcer Etiology: Wound Location: Left, Medial Upper Leg Wound Open Wounding Event: Pressure Injury Status: Date Acquired: 12/14/2022 Comorbid Cataracts, Lymphedema, Asthma, Congestive Heart Failure, Weeks Of Treatment: 0 History: Coronary Artery Disease, Hypertension, Peripheral Arterial Disease, Clustered  Wound: Yes Peripheral Venous Disease, Type II Diabetes, Rheumatoid Arthritis, Neuropathy Photos Wound Measurements Length: (cm) 5 Width: (  cm) 5 Depth: (cm) 0.1 Area: (cm) 19.635 Volume: (cm) 1.963 % Reduction in Area: % Reduction in Volume: Epithelialization: None Tunneling: No Undermining: No Wound Description Classification: Category/Stage II Foul Odor After Cleansing: No Slough/Fibrino Yes Wound Bed Granulation Amount: Large (67-100%) Exposed Structure Granulation Quality: Red Fascia Exposed: No Necrotic Amount: Small (1-33%) Fat Layer (Subcutaneous Tissue) Exposed: Yes Necrotic Quality: Adherent Slough Tendon Exposed: No Muscle Exposed: No Vavra, Miachel Roux ([id number]) [id number] Page 11 of 14 Joint Exposed: No Bone Exposed: No Periwound Skin Texture Texture Color No Abnormalities Noted: No No Abnormalities Noted: Yes Scarring: Yes Temperature / Pain Temperature: No Abnormality Moisture No Abnormalities Noted: Yes Treatment Notes Wound #22 (Upper Leg) Wound Laterality: Left, Medial Cleanser Peri-Wound Care Topical Primary Dressing Maxorb Extra Ag+ Alginate Dressing, 4x4.75 (in/in) Discharge Instruction: Apply to wound bed as instructed Secondary Dressing ABD Pad, 8x10 Discharge Instruction: Apply over primary dressing as directed. Secured With 71M Medipore H Soft Cloth Surgical T ape, 4 x 10 (in/yd) Discharge Instruction: Secure with tape as directed. Compression Wrap Compression Stockings Add-Ons Electronic Signature(s) Signed: 12/26/2022 12:57:04 PM By: Dayton Scrape Signed: 12/26/2022 3:54:46 PM By: Karie Schwalbe RN Entered By: Dayton Scrape on 12/26/2022 10:56:30 -------------------------------------------------------------------------------- Wound Assessment Details Patient Name: Date of Service: Firman, [name] 12/26/2022 10:15 A M Medical Record Number: [id number] Patient Account Number: [id number] Date of Birth/Sex:  Treating RN: 1960-12-20 (62 y.o. [name] Primary Care Tyara Dassow: [name] Other Clinician: Referring Sharnette Kitamura: Treating Ashely Joshua/Extender: Vivien Rossetti in Treatment: 22 Wound Status Wound Number: 23 Primary Abrasion Etiology: Wound Location: Midline Umbilicus Wound Open Wounding Event: Other Lesion Status: Date Acquired: 12/14/2022 Comorbid Cataracts, Lymphedema, Asthma, Congestive Heart Failure, Weeks Of Treatment: 0 History: Coronary Artery Disease, Hypertension, Peripheral Arterial Disease, Clustered Wound: Yes Peripheral Venous Disease, Type II Diabetes, Rheumatoid Arthritis, Neuropathy Photos Hodo, Billye M ([id number]) [id number] Page 12 of 14 Wound Measurements Length: (cm) 10 Width: (cm) 6 Depth: (cm) 0.1 Area: (cm) 47.124 Volume: (cm) 4.712 % Reduction in Area: % Reduction in Volume: Tunneling: No Undermining: No Wound Description Classification: Full Thickness Without Exposed Suppor Exudate Amount: Medium Exudate Type: Serosanguineous Exudate Color: red, brown t Structures Foul Odor After Cleansing: No Slough/Fibrino Yes Wound Bed Granulation Amount: Medium (34-66%) Exposed Structure Granulation Quality: Pink Fascia Exposed: No Necrotic Amount: Medium (34-66%) Fat Layer (Subcutaneous Tissue) Exposed: Yes Tendon Exposed: No Muscle Exposed: No Joint Exposed: No Bone Exposed: No Periwound Skin Texture Texture Color No Abnormalities Noted: Yes No Abnormalities Noted: Yes Moisture Temperature / Pain No Abnormalities Noted: Yes Temperature: No Abnormality Treatment Notes Wound #23 (Umbilicus) Wound Laterality: Midline Cleanser Peri-Wound Care Topical Primary Dressing MediHoney Gel, tube 1.5 (oz) Discharge Instruction: Apply to wound bed as instructed Secondary Dressing ABD Pad, 8x10 Discharge Instruction: Apply over primary dressing as directed. Secured With 71M Medipore  H Soft Cloth Surgical T ape, 4 x 10 (in/yd) Discharge Instruction: Secure with tape as directed. Compression Wrap Compression Stockings Add-Ons Electronic Signature(s) Signed: 12/26/2022 3:54:46 PM By: Karie Schwalbe RN Entered By: Karie Schwalbe on 12/26/2022 11:29:31 Lamping, Miachel Roux ([id number]) [id number] Page 13 of 14 -------------------------------------------------------------------------------- Wound Assessment Details Patient Name: Date of Service: Stetzel, [name] 12/26/2022 10:15 A M Medical Record Number: [id number] Patient Account Number: [id number] Date of Birth/Sex: Treating RN: October 17, 1960 (62 y.o. [name] Primary Care Neesha Langton: [name] Other Clinician: Referring Oreoluwa Aigner: Treating Lilla Callejo/Extender: Vivien Rossetti in Treatment: 22 Wound Status Wound Number: 24 Primary Lesion Etiology: Wound Location: Abdomen -  midline Wound Open Wounding Event: Other Lesion Status: Date Acquired: 12/14/2022 Notes: PANNUS Weeks Of Treatment: 0 Comorbid Cataracts, Lymphedema, Asthma, Congestive Heart Failure, Clustered Wound: Yes History: Coronary Artery Disease, Hypertension, Peripheral Arterial Disease, Peripheral Venous Disease, Type II Diabetes, Rheumatoid Arthritis, Neuropathy Photos Wound Measurements Length: (cm) 1 Width: (cm) 3 Depth: (cm) 0.1 Area: (cm) 2.356 Volume: (cm) 0.236 % Reduction in Area: % Reduction in Volume: Epithelialization: None Tunneling: No Undermining: No Wound Description Classification: Full Thickness Without Exposed Suppor Exudate Amount: Medium Exudate Type: Serosanguineous Exudate Color: red, brown t Structures Foul Odor After Cleansing: No Slough/Fibrino Yes Wound Bed Granulation Amount: Large (67-100%) Exposed Structure Granulation Quality: Red Fascia Exposed: No Necrotic Amount: Small (1-33%) Fat Layer (Subcutaneous Tissue) Exposed: Yes Necrotic  Quality: Adherent Slough Tendon Exposed: No Muscle Exposed: No Joint Exposed: No Bone Exposed: No Periwound Skin Texture Texture Color No Abnormalities Noted: Yes No Abnormalities Noted: Yes Moisture Temperature / Pain No Abnormalities Noted: Yes Temperature: No Abnormality Treatment Notes Wound #24 (Abdomen - midline) Cleanser Peri-Wound Care Topical Ketoconazole Cream 2% Discharge Instruction: Apply Ketoconazole as directed Meine, Miachel Roux ([id number]) [id number] Page 14 of 14 Interdry Discharge Instruction: Apply to under Pannus Primary Dressing Secondary Dressing Secured With Compression Wrap Compression Stockings Add-Ons Electronic Signature(s) Signed: 12/26/2022 3:54:46 PM By: Karie Schwalbe RN Entered By: Karie Schwalbe on 12/26/2022 11:30:09 -------------------------------------------------------------------------------- Vitals Details Patient Name: Date of Service: Kazmi, [name] 12/26/2022 10:15 A M Medical Record Number: [id number] Patient Account Number: [id number] Date of Birth/Sex: Treating RN: 08-26-1961 (62 y.o. F) Primary Care Devin Foskey: [name] Other Clinician: Referring Adella Manolis: Treating Madysin Crisp/Extender: Vivien Rossetti in Treatment: 22 Vital Signs Time Taken: 10:16 Temperature (F): 98.1 Height (in): 62 Pulse (bpm): 83 Weight (lbs): 284 Respiratory Rate (breaths/min): 18 Body Mass Index (BMI): 51.9 Blood Pressure (mmHg): 134/73 Capillary Blood Glucose (mg/dl): 244 Reference Range: 80 - 120 mg / dl Electronic Signature(s) Signed: 12/26/2022 12:57:04 PM By: Dayton Scrape Entered By: Dayton Scrape on 12/26/2022 10:19:58

## 2022-12-28 ENCOUNTER — Other Ambulatory Visit: Payer: Self-pay | Admitting: Family Medicine

## 2022-12-28 ENCOUNTER — Other Ambulatory Visit: Payer: Self-pay | Admitting: Cardiology

## 2022-12-28 DIAGNOSIS — J45909 Unspecified asthma, uncomplicated: Secondary | ICD-10-CM

## 2023-01-01 ENCOUNTER — Encounter: Payer: Commercial Managed Care - HMO | Attending: Registered Nurse | Admitting: Registered Nurse

## 2023-01-01 ENCOUNTER — Encounter: Payer: Self-pay | Admitting: Registered Nurse

## 2023-01-01 VITALS — BP 128/68 | HR 81 | Ht 62.0 in

## 2023-01-01 DIAGNOSIS — Z79899 Other long term (current) drug therapy: Secondary | ICD-10-CM | POA: Diagnosis present

## 2023-01-01 DIAGNOSIS — M25512 Pain in left shoulder: Secondary | ICD-10-CM | POA: Diagnosis present

## 2023-01-01 DIAGNOSIS — G894 Chronic pain syndrome: Secondary | ICD-10-CM | POA: Diagnosis present

## 2023-01-01 DIAGNOSIS — M25511 Pain in right shoulder: Secondary | ICD-10-CM | POA: Diagnosis not present

## 2023-01-01 DIAGNOSIS — G8929 Other chronic pain: Secondary | ICD-10-CM

## 2023-01-01 DIAGNOSIS — Z89611 Acquired absence of right leg above knee: Secondary | ICD-10-CM | POA: Diagnosis not present

## 2023-01-01 DIAGNOSIS — Z5181 Encounter for therapeutic drug level monitoring: Secondary | ICD-10-CM | POA: Insufficient documentation

## 2023-01-01 DIAGNOSIS — M2550 Pain in unspecified joint: Secondary | ICD-10-CM

## 2023-01-01 DIAGNOSIS — M7062 Trochanteric bursitis, left hip: Secondary | ICD-10-CM

## 2023-01-01 DIAGNOSIS — M546 Pain in thoracic spine: Secondary | ICD-10-CM | POA: Insufficient documentation

## 2023-01-01 DIAGNOSIS — M5416 Radiculopathy, lumbar region: Secondary | ICD-10-CM | POA: Insufficient documentation

## 2023-01-01 DIAGNOSIS — M7061 Trochanteric bursitis, right hip: Secondary | ICD-10-CM | POA: Insufficient documentation

## 2023-01-01 DIAGNOSIS — F0631 Mood disorder due to known physiological condition with depressive features: Secondary | ICD-10-CM | POA: Diagnosis present

## 2023-01-01 MED ORDER — OXYCODONE HCL 20 MG PO TABS: 1.0000 | ORAL_TABLET | Freq: Three times a day (TID) | ORAL | 0 refills | Status: DC | PRN

## 2023-01-01 NOTE — Progress Notes (Signed)
Subjective:    Patient ID: [name], female    DOB: 10-Jul-1961, 62 y.o.   MRN: [id number]  HPI: [name] is a 62 y.o. female who returns for follow up appointment for chronic pain and medication refill. She states her pain is located in her lower back and left foot pain. Wound care following her left foot wound. Wound care note was reviewed.  She rates her pain 7. Her current exercise regime is performing stretching exercises.  [name] Morphine equivalent is 90.00 MME.   Last Oral Swab was Performed on 11/28/2022, it was consistent.    Pain Inventory Average Pain 7 Pain Right Now 7 My pain is constant, sharp, and burning  In the last 24 hours, has pain interfered with the following? General activity 9 Relation with others 9 Enjoyment of life 9 What TIME of day is your pain at its worst? night Sleep (in general) Poor  Pain is worse with: walking, bending, sitting, and inactivity Pain improves with: rest, heat/ice, therapy/exercise, pacing activities, medication, and injections Relief from Meds: 9  Family History  Problem Relation Age of Onset   CAD Mother    Hypertension Mother    Heart attack Mother    Stroke Mother    CAD Father    Heart attack Father    Allergic rhinitis Father    Asthma Father    Hypertension Brother    Hypertension Brother    Pancreatic cancer Paternal Aunt    Breast cancer Paternal Aunt    Lung cancer Paternal Aunt    Allergic rhinitis Son    Asthma Son    Social History   Socioeconomic History   Marital status: Married    Spouse name: [name]   Number of children: 1   Years of education: 11   Highest education level: GED or equivalent  Occupational History    Comment: Not currrently working ( last worked 2004)  Tobacco Use   Smoking status: Former    Packs/day: 1.00    Years: 19.00    Additional pack years: 0.00    Total pack years: 19.00    Types: Cigarettes    Start date: 41    Quit date: 02/11/2004     Years since quitting: 18.9    Passive exposure: Past   Smokeless tobacco: Never  Vaping Use   Vaping Use: Never used  Substance and Sexual Activity   Alcohol use: Yes    Comment: RARE Wine   Drug use: Not Currently    Types: Marijuana    Comment: "only in my teens"   Sexual activity: Yes    Birth control/protection: Surgical    Comment: Hysterectomy  Other Topics Concern   Not on file  Social History Narrative   Not on file   Social Determinants of [name]   Financial Resource Strain: Low Risk  (12/27/2021)   Overall Financial Resource Strain (CARDIA)    Difficulty of Paying Living Expenses: Not hard at all  Food Insecurity: No Food Insecurity (06/14/2022)   Hunger Vital Sign    Worried About Running Out of Food in the Last Year: Never true    Ran Out of Food in the Last Year: Never true  Transportation Needs: No Transportation Needs (06/14/2022)   PRAPARE - Administrator, Civil Service (Medical): No    Lack of Transportation (Non-Medical): No  Physical Activity: Inactive (08/25/2021)   Exercise Vital Sign    Days of Exercise per Week:  0 days    Minutes of Exercise per Session: 0 min  Stress: Stress Concern Present (08/25/2021)   [name] - Occupational Stress Questionnaire    Feeling of Stress : Very much  Social Connections: Moderately Integrated (08/25/2021)   Social Connection and Isolation Panel [NHANES]    Frequency of Communication with Friends and Family: More than three times a week    Frequency of Social Gatherings with Friends and Family: More than three times a week    Attends Religious Services: More than 4 times per year    Active Member of [employer] or Organizations: No    Attends Banker Meetings: Never    Marital Status: Married   Past Surgical History:  Procedure Laterality Date   ABDOMINAL HYSTERECTOMY  06/2005   "w/right ovariy"   AMPUTATION Right 01/28/2020    RIGHT FOURTH AND FIFTH RAY  AMPUTATION   AMPUTATION Right 01/28/2020   Procedure: RIGHT FOURTH AND FIFTH RAY AMPUTATION,;  Surgeon: [name] Mustard, MD;  Location: [facility] OR;  Service: Orthopedics;  Laterality: Right;   AMPUTATION Right 06/01/2021   Procedure: RIGHT BELOW KNEE AMPUTATION;  Surgeon: [name] Mustard, MD;  Location: [facility] OR;  Service: Orthopedics;  Laterality: Right;   AMPUTATION Right 07/22/2021   Procedure: RIGHT ABOVE KNEE AMPUTATION;  Surgeon: [name] Mustard, MD;  Location: [facility] OR;  Service: Orthopedics;  Laterality: Right;   APPENDECTOMY     BREAST BIOPSY Bilateral    9 total (04/18/2018)   CORONARY ANGIOPLASTY WITH STENT PLACEMENT  10/01/2015   normal LM, 30% LAD, 85% mid RCA and 95% distal RCA s/p PCI of the mid to distal RCA and now on DAPT with ASA and Ticagrelor.     CORONARY STENT INTERVENTION Right 04/18/2018   Procedure: CORONARY STENT INTERVENTION;  Surgeon: [name], MD;  Location: [facility];  Service: Cardiovascular;  Laterality: Right;   DILATION AND CURETTAGE OF UTERUS     FRACTURE SURGERY     LAPAROSCOPIC CHOLECYSTECTOMY     LEFT HEART CATH AND CORONARY ANGIOGRAPHY N/A 04/18/2018   Procedure: LEFT HEART CATH AND CORONARY ANGIOGRAPHY;  Surgeon: [name], MD;  Location: [facility];  Service: Cardiovascular;  Laterality: N/A;   MUSCLE BIOPSY Left    "leg"   OOPHORECTOMY  07/2010   RADIOLOGY WITH ANESTHESIA N/A 05/25/2016   Procedure: RADIOLOGY WITH ANESTHESIA;  Surgeon: Medication Radiologist, MD;  Location: [facility] OR;  Service: Radiology;  Laterality: N/A;   STUMP REVISION Right 06/29/2021   Procedure: REVISION RIGHT BELOW KNEE AMPUTATION;  Surgeon: [name] Mustard, MD;  Location: [name] OR;  Service: Orthopedics;  Laterality: Right;   TEE WITHOUT CARDIOVERSION N/A 06/01/2021   Procedure: TRANSESOPHAGEAL ECHOCARDIOGRAM (TEE);  Surgeon: [name] Rives, MD;  Location: [facility] OR;  Service: Cardiovascular;  Laterality: N/A;   TONSILLECTOMY AND ADENOIDECTOMY     WRIST  FRACTURE SURGERY Left    "crushed it"   Past Surgical History:  Procedure Laterality Date   ABDOMINAL HYSTERECTOMY  06/2005   "w/right ovariy"   AMPUTATION Right 01/28/2020    RIGHT FOURTH AND FIFTH RAY AMPUTATION   AMPUTATION Right 01/28/2020   Procedure: RIGHT FOURTH AND FIFTH RAY AMPUTATION,;  Surgeon: [name] Mustard, MD;  Location: [facility] OR;  Service: Orthopedics;  Laterality: Right;   AMPUTATION Right 06/01/2021   Procedure: RIGHT BELOW KNEE AMPUTATION;  Surgeon: [name] Mustard, MD;  Location: [facility] OR;  Service: Orthopedics;  Laterality: Right;  AMPUTATION Right 07/22/2021   Procedure: RIGHT ABOVE KNEE AMPUTATION;  Surgeon: [name] Mustard, MD;  Location: [facility] OR;  Service: Orthopedics;  Laterality: Right;   APPENDECTOMY     BREAST BIOPSY Bilateral    9 total (04/18/2018)   CORONARY ANGIOPLASTY WITH STENT PLACEMENT  10/01/2015   normal LM, 30% LAD, 85% mid RCA and 95% distal RCA s/p PCI of the mid to distal RCA and now on DAPT with ASA and Ticagrelor.     CORONARY STENT INTERVENTION Right 04/18/2018   Procedure: CORONARY STENT INTERVENTION;  Surgeon: [name], MD;  Location: [facility];  Service: Cardiovascular;  Laterality: Right;   DILATION AND CURETTAGE OF UTERUS     FRACTURE SURGERY     LAPAROSCOPIC CHOLECYSTECTOMY     LEFT HEART CATH AND CORONARY ANGIOGRAPHY N/A 04/18/2018   Procedure: LEFT HEART CATH AND CORONARY ANGIOGRAPHY;  Surgeon: [name], MD;  Location: [facility];  Service: Cardiovascular;  Laterality: N/A;   MUSCLE BIOPSY Left    "leg"   OOPHORECTOMY  07/2010   RADIOLOGY WITH ANESTHESIA N/A 05/25/2016   Procedure: RADIOLOGY WITH ANESTHESIA;  Surgeon: Medication Radiologist, MD;  Location: [facility] OR;  Service: Radiology;  Laterality: N/A;   STUMP REVISION Right 06/29/2021   Procedure: REVISION RIGHT BELOW KNEE AMPUTATION;  Surgeon: [name] Mustard, MD;  Location: [facility] OR;  Service: Orthopedics;  Laterality: Right;   TEE WITHOUT CARDIOVERSION  N/A 06/01/2021   Procedure: TRANSESOPHAGEAL ECHOCARDIOGRAM (TEE);  Surgeon: [name] Rives, MD;  Location: [facility] OR;  Service: Cardiovascular;  Laterality: N/A;   TONSILLECTOMY AND ADENOIDECTOMY     WRIST FRACTURE SURGERY Left    "crushed it"   Past Medical History:  Diagnosis Date   Aortic stenosis    mild AS by echo 02/2021   Asthma    "on daily RX and rescue inhaler" (04/18/2018)   Chronic diastolic CHF (congestive heart failure) (HCC) 09/2015   Chronic lower back pain    Chronic neck pain    Chronic pain syndrome    Fentanyl Patch   Colon polyps    Coronary artery disease    cath with normal LM, 30% LAD, 85% mid RCA and 95% distal RCA s/p PCI of the mid to distal RCA and now on DAPT with ASA and Ticagrelor.     Eczema    Excessive daytime sleepiness 11/26/2015   Fibromyalgia    Gallstones    GERD (gastroesophageal reflux disease)    Heart murmur    "noted for the 1st time on 04/18/2018"   History of blood transfusion 07/2010   "S/P oophorectomy"   History of gout    History of hiatal hernia 1980s   "gone now" (04/18/2018)   Hyperlipidemia    Hypertension    takes Metoprolol and Enalapril daily   Hypothyroidism    takes Synthroid daily   IBS (irritable bowel syndrome)    Migraine    "nothing in the 2000s" (04/18/2018)   Mixed connective tissue disease (HCC)    NAFLD (nonalcoholic fatty liver disease)    Pneumonia    "several times" (04/18/2018)   PVC's (premature ventricular contractions)    noted on event monitor 02/2021   Rheumatoid arthritis (HCC)    "hands, elbows, shoulders, probably knees" (04/18/2018)   Scoliosis    Sjogren's syndrome (HCC)    Sleep apnea    mild - does not use cpap   Spondylosis    Type II diabetes mellitus (HCC)  takes Metformin and Hum R daily (04/18/2018)   Walker as ambulation aid    also uses wheelchair   There were no vitals taken for this visit.  Opioid Risk Score:   Fall Risk Score:  `1  Depression screen [facility] [address]     11/28/2022     9:45 AM 08/22/2022   11:21 AM 07/07/2022    9:16 AM 06/27/2022   11:39 AM 05/02/2022   11:56 AM 03/30/2022   11:08 AM 02/27/2022    1:37 PM  Depression screen PHQ 2/9  Decreased Interest 1 1 0 1 1 1 1   Down, Depressed, Hopeless 1 1 1 1 1 1 1   PHQ - 2 Score 2 2 1 2 2 2 2     Review of Systems  Musculoskeletal:  Positive for back pain, gait problem and neck pain.       Right knee pain, right knee pain, pain in both elbow, pain in both shoulders   All other systems reviewed and are negative.      Objective:   Physical Exam Vitals and nursing note reviewed.  Constitutional:      Appearance: Normal appearance.  Cardiovascular:     Rate and Rhythm: Normal rate and regular rhythm.     Pulses: Normal pulses.     Heart sounds: Normal heart sounds.  Pulmonary:     Effort: Pulmonary effort is normal.     Breath sounds: Normal breath sounds.  Musculoskeletal:     Cervical back: Normal range of motion and neck supple.     Comments: Normal Muscle Bulk and Muscle Testing Reveals:  Upper Extremities: Right: Decreased ROM 30 Degrees and Muscle Strength 5/5 Left: Decreased ROM 90 Degrees and Muscle Strength 5/5 Lumbar Paraspinal Tenderness: L-3-L-5  Lower Extremities: Right AKA Left Lower Extremity: Decreased ROM and Muscle Strength 4/5 Left foot dressing intact Wearing post op shoe  Arrived in wheelchair      Skin:    General: Skin is warm and dry.  Neurological:     Mental Status: She is alert and oriented to person, place, and time.  Psychiatric:        Mood and Affect: Mood normal.        Behavior: Behavior normal.         Assessment & Plan:  HX: of Right AKA: Dr Lajoyce Corners Following. Continue to Monitor. 01/01/2023 Diabetic Polyneuropathy associated with Type 2 DM: Continue current medication regimen with Lyrica. Continue to Monitor. 01/01/2023 DM Type 2: PCP Following. Continue current medication regimen. Continue to Monitor. 01/01/2023 Chronic  Pain Syndrome : Refilled:  Oxycodone 20 mg one tablet every 8 hours as needed for pain #90.  We will continue the opioid monitoring program, this consists of regular clinic visits, examinations, urine drug screen, pill counts as well as use of [place] Controlled Substance Reporting system. A 12 month History has been reviewed on the [place] Controlled Substance Reporting System on 01/01/2023 Stage 1 Decubitus: Left Buttock and Left Lower Extremity: Continue Adequate Nutrition and Weight Shifting.  Wound Care following. Continue to Monitor. 01/01/2023  6. Cellulitis: Ms. Wesolowski was hospitalized on 06/14/2022- 06/20/2022, discharge summary was reviewed.  Continue to Monitpr. PCP Following.01/01/2023 7. Phantom Pain: Continue Gabapentin  Continue to Monitor. 01/01/2023 8. Polyarthralgia: Continue HEP as Tolerated. Continue to Monitor. 01/01/2023 9. Chronic Bilateral Shoulder Pain: Continue HEP as tolerated. Continue to Monitor.01/01/2023  10. Chronic Bilateral Lower Back Pain without Sciatica: Continue Lidocaine Patches 12 hours on and 12 hours off, she verbalizes  understanding. Continue HEP as tolerated. Continue current medication regimen. Continue to Monitor. 01/01/2023 11. Greater Trochanter Bursitis: Continue to Alternate Ice and Heat Therapy. Continue to Monitor. 01/01/2023 12. Left Foot Ulcer: Husband performing dressing changes. Wound Care Following and Dr Lajoyce Corners Following. Continue to Monitor. 01/01/2023

## 2023-01-01 NOTE — Addendum Note (Signed)
Addended by: Doreene Eland on: 01/01/2023 09:25 AM   Modules accepted: Orders

## 2023-01-02 ENCOUNTER — Ambulatory Visit (HOSPITAL_BASED_OUTPATIENT_CLINIC_OR_DEPARTMENT_OTHER): Payer: Commercial Managed Care - HMO | Admitting: General Surgery

## 2023-01-06 ENCOUNTER — Encounter: Payer: Self-pay | Admitting: Registered Nurse

## 2023-01-08 ENCOUNTER — Ambulatory Visit (INDEPENDENT_AMBULATORY_CARE_PROVIDER_SITE_OTHER): Payer: Commercial Managed Care - HMO | Admitting: Family Medicine

## 2023-01-08 ENCOUNTER — Encounter: Payer: Self-pay | Admitting: Family Medicine

## 2023-01-08 ENCOUNTER — Encounter: Payer: Self-pay | Admitting: Licensed Clinical Social Worker

## 2023-01-08 VITALS — BP 120/78 | HR 89 | Temp 98.5°F

## 2023-01-08 DIAGNOSIS — Z794 Long term (current) use of insulin: Secondary | ICD-10-CM

## 2023-01-08 DIAGNOSIS — N898 Other specified noninflammatory disorders of vagina: Secondary | ICD-10-CM | POA: Diagnosis not present

## 2023-01-08 DIAGNOSIS — Z7689 Persons encountering health services in other specified circumstances: Secondary | ICD-10-CM | POA: Diagnosis not present

## 2023-01-08 DIAGNOSIS — G2581 Restless legs syndrome: Secondary | ICD-10-CM | POA: Diagnosis not present

## 2023-01-08 DIAGNOSIS — E1165 Type 2 diabetes mellitus with hyperglycemia: Secondary | ICD-10-CM | POA: Diagnosis not present

## 2023-01-08 DIAGNOSIS — E039 Hypothyroidism, unspecified: Secondary | ICD-10-CM | POA: Diagnosis not present

## 2023-01-08 DIAGNOSIS — S78119A Complete traumatic amputation at level between unspecified hip and knee, initial encounter: Secondary | ICD-10-CM

## 2023-01-08 LAB — COMPREHENSIVE METABOLIC PANEL
ALT: 24 U/L (ref 0–35)
AST: 29 U/L (ref 0–37)
Albumin: 3.8 g/dL (ref 3.5–5.2)
Alkaline Phosphatase: 90 U/L (ref 39–117)
BUN: 14 mg/dL (ref 6–23)
CO2: 32 mEq/L (ref 19–32)
Calcium: 9.1 mg/dL (ref 8.4–10.5)
Chloride: 96 mEq/L (ref 96–112)
Creatinine, Ser: 0.88 mg/dL (ref 0.40–1.20)
GFR: 70.88 mL/min (ref >60.00)
Glucose, Bld: 129 mg/dL — ABNORMAL HIGH (ref 70–99)
Potassium: 3.9 mEq/L (ref 3.5–5.1)
Sodium: 140 mEq/L (ref 135–145)
Total Bilirubin: 0.4 mg/dL (ref 0.2–1.2)
Total Protein: 7.3 g/dL (ref 6.0–8.3)

## 2023-01-08 LAB — LIPID PANEL
Cholesterol: 182 mg/dL (ref 0–200)
HDL: 47 mg/dL (ref >39.00)
LDL Cholesterol: 112 mg/dL — ABNORMAL HIGH (ref 0–99)
NonHDL: 134.52
Total CHOL/HDL Ratio: 4
Triglycerides: 114 mg/dL (ref 0.0–149.0)
VLDL: 22.8 mg/dL (ref 0.0–40.0)

## 2023-01-08 LAB — TSH: TSH: 4.21 u[IU]/mL (ref 0.35–5.50)

## 2023-01-08 LAB — T4, FREE: Free T4: 1.13 ng/dL (ref 0.60–1.60)

## 2023-01-08 MED ORDER — CLINDAMYCIN HCL 300 MG PO CAPS
300.0000 mg | ORAL_CAPSULE | Freq: Two times a day (BID) | ORAL | 0 refills | Status: AC
Start: 2023-01-08 — End: 2023-01-15

## 2023-01-08 NOTE — Patient Instructions (Signed)
Give us 2-3 business days to get the results of your labs back.   Keep the diet clean and stay active.  If you do not hear anything about your referral in the next 1-2 weeks, call our office and ask for an update.  Let us know if you need anything. 

## 2023-01-08 NOTE — Progress Notes (Signed)
Chief Complaint  Patient presents with   Follow-up    Vaginal infection, she thinks from [place] and she has stopped taking it.     Subjective: Patient is a 62 y.o. female here for f/u.  Here with her spouse who helps with the history.  RLS The patient is currently taking Mirapex 1 mg 3 times daily.  She reports compliance and no adverse effects.  This current regimen is doing well controlling her symptoms.  Hypothyroidism Patient presents for follow-up of hypothyroidism.  Reports compliance with medication-levothyroxine 200 mcg daily. Current symptoms include: denies fatigue, weight changes, heat/cold intolerance, bowel/skin changes or CVS symptoms She believes her dose should be not significantly changed  She has a right AKA in addition to a large diabetic left foot wound.  Ambulation is very difficult for her.  She is fully dependent on her husband to get out and about.  She is requesting a power wheelchair.  She also reports around 2 weeks of a vaginal discharge.  A whitish-yellow color, she is currently taking Diflucan for yeast infection but reports this does not feel similar.  She is having some discomfort but no fishy odor.  She denies any fevers or urinary complaints.  Past Medical History:  Diagnosis Date   Aortic stenosis    mild AS by echo 02/2021   Asthma    "on daily RX and rescue inhaler" (04/18/2018)   Chronic diastolic CHF (congestive heart failure) (HCC) 09/2015   Chronic lower back pain    Chronic neck pain    Chronic pain syndrome    Fentanyl Patch   Colon polyps    Coronary artery disease    cath with normal LM, 30% LAD, 85% mid RCA and 95% distal RCA s/p PCI of the mid to distal RCA and now on DAPT with ASA and Ticagrelor.     Eczema    Excessive daytime sleepiness 11/26/2015   Fibromyalgia    Gallstones    GERD (gastroesophageal reflux disease)    Heart murmur    "noted for the 1st time on 04/18/2018"   History of blood transfusion 07/2010   "S/P  oophorectomy"   History of gout    History of hiatal hernia 1980s   "gone now" (04/18/2018)   Hyperlipidemia    Hypertension    takes Metoprolol and Enalapril daily   Hypothyroidism    takes Synthroid daily   IBS (irritable bowel syndrome)    Migraine    "nothing in the 2000s" (04/18/2018)   Mixed connective tissue disease (HCC)    NAFLD (nonalcoholic fatty liver disease)    Pneumonia    "several times" (04/18/2018)   PVC's (premature ventricular contractions)    noted on event monitor 02/2021   Rheumatoid arthritis (HCC)    "hands, elbows, shoulders, probably knees" (04/18/2018)   Scoliosis    Sjogren's syndrome (HCC)    Sleep apnea    mild - does not use cpap   Spondylosis    Type II diabetes mellitus (HCC)    takes Metformin and Hum R daily (04/18/2018)   Walker as ambulation aid    also uses wheelchair    Objective: BP 120/78 (BP Location: Left Arm, Patient Position: Sitting, Cuff Size: Large)   Pulse 89   Temp 98.5 F (36.9 C) (Oral)   SpO2 95%  General: Awake, appears stated age Heart: RRR Lungs: CTAB, no rales, wheezes or rhonchi. No accessory muscle use MSK: Right AKA noted, left foot is wrapped Neuro: No cerebellar signs,  no tremors Psych: Age appropriate judgment and insight, normal affect and mood  Assessment and Plan: Vaginal discharge - Plan: clindamycin (CLEOCIN) 300 MG capsule  RLS (restless legs syndrome)  Hypothyroidism, unspecified type - Plan: T4, free, TSH  Encounter for power mobility device assessment  Above-knee amputation (HCC) - Plan: Ambulatory referral to Home Health  Type 2 diabetes mellitus with hyperglycemia, with long-term current use of insulin (HCC) - Plan: Ambulatory referral to Endocrinology, Comprehensive metabolic panel, Lipid panel  She has an intolerance to Flagyl, will send in 7 days of clindamycin 300 mg twice daily. Chronic, stable.  Continue Mirapex 1 mg 3 times daily. Chronic, stable.  Continue levothyroxine 200 mcg daily.   Check labs today. Refer to home health for physical therapy.  She also has some physical deconditioning and also is dealing with an ulcer. The power chair will assist the patient with basic movement given her lack of the lower extremity and ulceration on the other side.  This includes getting to the restroom, getting to bed, getting to the closet and dressing herself, and making appointments.  Her mental capacity to operate a wheelchair is good. Chronic and not likely controlled.  Continue metformin for now.  She stopped [place] due to recurrent yeast infections.  Will refer back to endocrinology. Follow-up in 6 months as what we are managing is overall controlled. The patient and her spouse voiced understanding and agreement to the plan.  I spent 45 minutes with the patient discussing the above plans in addition to reviewing her chart on the same day of the visit.  Jilda Roche [place], DO 01/08/23  12:23 PM

## 2023-01-08 NOTE — Patient Outreach (Signed)
  Care Coordination  Collaboration/ consultation  Visit Note   01/08/2023 Name: [name] MRN: [id number] DOB: Jan 30, 1961  [name] is a 62 y.o. year old female who sees Carmelia Roller, Jilda Roche, DO for primary care. I engaged with provider while in the office today.  What matters to the patients health and wellness today?  Getting motorized wheelchair  [facility] referral placed by PCP. LCSW  sent e-mail to Zenia Res[facility] [employer] wheelchair coordinator to start the process .  SDOH assessments and interventions completed:  No   Care Coordination Interventions:  Yes, provided  Interventions Today    Flowsheet Row Most Recent Value  Chronic Disease   Chronic disease during today's visit Hypertension (HTN), Congestive Heart Failure (CHF), Diabetes  General Interventions   General Interventions Discussed/Reviewed General Interventions Discussed, Durable Medical Equipment (DME), Communication with  Durable Medical Equipment (DME) Wheelchair  Wheelchair Motorized  Communication with PCP/Specialists  [facility] with [employer]]       Follow up plan: No further intervention required. Patient is not eligible for long term management services provided by the Care Coordination team However LCSW will f/u on referral in 1 to 2 weeks to make sure is it progressing    Encounter Outcome:  Pt. Visit Completed   Sammuel Hines, LCSW Social Work Care Coordination  [facility] Emmie Niemann [employer] [phone]

## 2023-01-08 NOTE — Patient Instructions (Signed)
  Patient was not contacted during this encounter.  LCSW provided consultation to assist with unmet needs. Related to getting a power wheelchair  Sammuel Hines, LCSW Social Work Care Coordination  [phone]

## 2023-01-09 ENCOUNTER — Telehealth: Payer: Self-pay | Admitting: Pharmacist

## 2023-01-09 NOTE — Telephone Encounter (Signed)
Called patient to see if she had started using DexCom 7 Continuous Glucose Monitor. She states that she has not started using DexCom 7 yet. She is trying to move data from her old phone to her new phone.  Recommended that she could go ahead and start DexCom even during this process.  Patient was a little down today and mentioned that she knew she needed to go better with checking her blood glucose.  Provided active listening to patient's concerns. Discussed that the benefits of Continuous Glucose Monitor is that it is much easier to use that fingerstick systems and patient's experience less treatment / monitoring fatigue.   Patient also asked about eating dairy with clindamycin which was started yesterday. No issues with dairy and clindamycin.   Plan to follow up with patient in 1 month unless she has questions about Continuous Glucose Monitor or medications and she can call office sooner.

## 2023-01-10 ENCOUNTER — Encounter (HOSPITAL_BASED_OUTPATIENT_CLINIC_OR_DEPARTMENT_OTHER): Payer: Commercial Managed Care - HMO | Attending: General Surgery | Admitting: General Surgery

## 2023-01-10 ENCOUNTER — Other Ambulatory Visit: Payer: Self-pay | Admitting: Family Medicine

## 2023-01-10 DIAGNOSIS — E11622 Type 2 diabetes mellitus with other skin ulcer: Secondary | ICD-10-CM | POA: Diagnosis not present

## 2023-01-10 DIAGNOSIS — L97112 Non-pressure chronic ulcer of right thigh with fat layer exposed: Secondary | ICD-10-CM | POA: Insufficient documentation

## 2023-01-10 DIAGNOSIS — I5032 Chronic diastolic (congestive) heart failure: Secondary | ICD-10-CM | POA: Insufficient documentation

## 2023-01-10 DIAGNOSIS — L97822 Non-pressure chronic ulcer of other part of left lower leg with fat layer exposed: Secondary | ICD-10-CM | POA: Insufficient documentation

## 2023-01-10 DIAGNOSIS — E11621 Type 2 diabetes mellitus with foot ulcer: Secondary | ICD-10-CM | POA: Diagnosis present

## 2023-01-10 DIAGNOSIS — L98492 Non-pressure chronic ulcer of skin of other sites with fat layer exposed: Secondary | ICD-10-CM | POA: Diagnosis not present

## 2023-01-10 DIAGNOSIS — L97423 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: Secondary | ICD-10-CM | POA: Insufficient documentation

## 2023-01-10 DIAGNOSIS — E1169 Type 2 diabetes mellitus with other specified complication: Secondary | ICD-10-CM | POA: Insufficient documentation

## 2023-01-10 DIAGNOSIS — S78119A Complete traumatic amputation at level between unspecified hip and knee, initial encounter: Secondary | ICD-10-CM

## 2023-01-10 DIAGNOSIS — I890 Lymphedema, not elsewhere classified: Secondary | ICD-10-CM | POA: Diagnosis not present

## 2023-01-10 DIAGNOSIS — E1151 Type 2 diabetes mellitus with diabetic peripheral angiopathy without gangrene: Secondary | ICD-10-CM | POA: Insufficient documentation

## 2023-01-10 DIAGNOSIS — M3500 Sicca syndrome, unspecified: Secondary | ICD-10-CM | POA: Diagnosis not present

## 2023-01-10 DIAGNOSIS — M869 Osteomyelitis, unspecified: Secondary | ICD-10-CM | POA: Diagnosis not present

## 2023-01-10 DIAGNOSIS — I110 Hypertensive heart disease with heart failure: Secondary | ICD-10-CM | POA: Diagnosis not present

## 2023-01-15 ENCOUNTER — Other Ambulatory Visit: Payer: Self-pay | Admitting: Family Medicine

## 2023-01-15 ENCOUNTER — Other Ambulatory Visit: Payer: Self-pay | Admitting: Physical Medicine and Rehabilitation

## 2023-01-15 DIAGNOSIS — M5416 Radiculopathy, lumbar region: Secondary | ICD-10-CM

## 2023-01-15 DIAGNOSIS — S78119A Complete traumatic amputation at level between unspecified hip and knee, initial encounter: Secondary | ICD-10-CM

## 2023-01-15 DIAGNOSIS — S88111S Complete traumatic amputation at level between knee and ankle, right lower leg, sequela: Secondary | ICD-10-CM

## 2023-01-22 ENCOUNTER — Other Ambulatory Visit: Payer: Self-pay | Admitting: Family Medicine

## 2023-01-22 ENCOUNTER — Encounter: Payer: Self-pay | Admitting: Family Medicine

## 2023-01-22 MED ORDER — ALBUTEROL SULFATE HFA 108 (90 BASE) MCG/ACT IN AERS: 2.0000 | INHALATION_SPRAY | Freq: Four times a day (QID) | RESPIRATORY_TRACT | 3 refills | Status: DC | PRN

## 2023-01-22 MED ORDER — HUMULIN R U-500 (CONCENTRATED) 500 UNIT/ML ~~LOC~~ SOLN: SUBCUTANEOUS | 2 refills | Status: DC

## 2023-01-23 ENCOUNTER — Other Ambulatory Visit: Payer: Self-pay | Admitting: Family Medicine

## 2023-01-23 MED ORDER — ALBUTEROL SULFATE HFA 108 (90 BASE) MCG/ACT IN AERS: 2.0000 | INHALATION_SPRAY | RESPIRATORY_TRACT | 5 refills | Status: DC | PRN

## 2023-01-25 ENCOUNTER — Encounter (HOSPITAL_BASED_OUTPATIENT_CLINIC_OR_DEPARTMENT_OTHER): Payer: Commercial Managed Care - HMO | Admitting: General Surgery

## 2023-01-25 DIAGNOSIS — E11621 Type 2 diabetes mellitus with foot ulcer: Secondary | ICD-10-CM | POA: Diagnosis not present

## 2023-01-27 ENCOUNTER — Other Ambulatory Visit: Payer: Self-pay | Admitting: Family Medicine

## 2023-01-29 ENCOUNTER — Other Ambulatory Visit: Payer: Self-pay | Admitting: Physical Medicine and Rehabilitation

## 2023-01-29 ENCOUNTER — Encounter: Payer: Self-pay | Admitting: Family Medicine

## 2023-01-29 DIAGNOSIS — S88111S Complete traumatic amputation at level between knee and ankle, right lower leg, sequela: Secondary | ICD-10-CM

## 2023-01-29 MED ORDER — MUPIROCIN 2 % EX OINT: TOPICAL_OINTMENT | Freq: Two times a day (BID) | CUTANEOUS | 1 refills | Status: DC

## 2023-01-30 ENCOUNTER — Other Ambulatory Visit: Payer: Self-pay | Admitting: Family Medicine

## 2023-01-30 ENCOUNTER — Encounter: Payer: Self-pay | Admitting: Registered Nurse

## 2023-01-30 ENCOUNTER — Encounter: Payer: Commercial Managed Care - HMO | Attending: Registered Nurse | Admitting: Registered Nurse

## 2023-01-30 VITALS — BP 124/70 | HR 87 | Resp 96 | Ht 62.0 in

## 2023-01-30 DIAGNOSIS — M2550 Pain in unspecified joint: Secondary | ICD-10-CM | POA: Diagnosis present

## 2023-01-30 DIAGNOSIS — M25511 Pain in right shoulder: Secondary | ICD-10-CM | POA: Diagnosis present

## 2023-01-30 DIAGNOSIS — M542 Cervicalgia: Secondary | ICD-10-CM | POA: Diagnosis present

## 2023-01-30 DIAGNOSIS — G8929 Other chronic pain: Secondary | ICD-10-CM | POA: Diagnosis present

## 2023-01-30 DIAGNOSIS — S88111S Complete traumatic amputation at level between knee and ankle, right lower leg, sequela: Secondary | ICD-10-CM | POA: Diagnosis present

## 2023-01-30 DIAGNOSIS — M545 Low back pain, unspecified: Secondary | ICD-10-CM | POA: Diagnosis present

## 2023-01-30 DIAGNOSIS — M25512 Pain in left shoulder: Secondary | ICD-10-CM | POA: Diagnosis present

## 2023-01-30 DIAGNOSIS — M79672 Pain in left foot: Secondary | ICD-10-CM | POA: Diagnosis present

## 2023-01-30 DIAGNOSIS — Z89611 Acquired absence of right leg above knee: Secondary | ICD-10-CM

## 2023-01-30 MED ORDER — OXYCODONE HCL 20 MG PO TABS: 1.0000 | ORAL_TABLET | Freq: Three times a day (TID) | ORAL | 0 refills | Status: DC | PRN

## 2023-01-30 MED ORDER — BETAMETHASONE DIPROPIONATE AUG 0.05 % EX CREA: 1.0000 | TOPICAL_CREAM | Freq: Two times a day (BID) | CUTANEOUS | 1 refills | Status: DC | PRN

## 2023-01-30 NOTE — Progress Notes (Signed)
Subjective:    Patient ID: [name], female    DOB: 12-Feb-1961, 62 y.o.   MRN: [id number]  HPI: [name] is a 62 y.o. female who returns for follow up appointment for chronic pain and medication refill. She states her pain is located in her neck, bilateral shoulders R>L, lower back, left knee and left foot. She rates her pain 6. Her current exercise regime is walking and performing stretching exercises.  [name] Morphine equivalent is 90.00 MME.   Last Oral Swab was Performed on 11/28/2022, it was consistent.    Pain Inventory Average Pain 7 Pain Right Now 6 My pain is constant, sharp, burning, dull, stabbing, tingling, and aching  In the last 24 hours, has pain interfered with the following? General activity 9 Relation with others 9 Enjoyment of life 9 What TIME of day is your pain at its worst? night Sleep (in general) Poor  Pain is worse with: walking, bending, and inactivity Pain improves with: rest, heat/ice, therapy/exercise, medication, and injections, distractions Relief from Meds: 6  Family History  Problem Relation Age of Onset  . CAD Mother   . Hypertension Mother   . Heart attack Mother   . Stroke Mother   . CAD Father   . Heart attack Father   . Allergic rhinitis Father   . Asthma Father   . Hypertension Brother   . Hypertension Brother   . Pancreatic cancer Paternal Aunt   . Breast cancer Paternal Aunt   . Lung cancer Paternal Aunt   . Allergic rhinitis Son   . Asthma Son    Social History   Socioeconomic History  . Marital status: Married    Spouse name: [name] Handel  . Number of children: 1  . Years of education: 19  . Highest education level: GED or equivalent  Occupational History    Comment: Not currrently working ( last worked 2004)  Tobacco Use  . Smoking status: Former    Packs/day: 1.00    Years: 19.00    Additional pack years: 0.00    Total pack years: 19.00    Types: Cigarettes    Start date: 72    Quit date:  02/11/2004    Years since quitting: 18.9    Passive exposure: Past  . Smokeless tobacco: Never  Vaping Use  . Vaping Use: Never used  Substance and Sexual Activity  . Alcohol use: Yes    Comment: [employer] Wine  . Drug use: Not Currently    Types: Marijuana    Comment: "only in my teens"  . Sexual activity: Yes    Birth control/protection: Surgical    Comment: Hysterectomy  Other Topics Concern  . Not on file  Social History Narrative  . Not on file   Social Determinants of Health   Financial Resource Strain: Low Risk  (12/27/2021)   Overall Financial Resource Strain (CARDIA)   . Difficulty of Paying Living Expenses: Not hard at all  Food Insecurity: No Food Insecurity (06/14/2022)   Hunger Vital Sign   . Worried About Programme researcher, broadcasting/film/video in the Last Year: Never true   . Ran Out of Food in the Last Year: Never true  Transportation Needs: No Transportation Needs (06/14/2022)   PRAPARE - Transportation   . Lack of Transportation (Medical): No   . Lack of Transportation (Non-Medical): No  Physical Activity: Inactive (08/25/2021)   Exercise Vital Sign   . Days of Exercise per Week: 0 days   .  Minutes of Exercise per Session: 0 min  Stress: Stress Concern Present (08/25/2021)   [employer] of Occupational Health - Occupational Stress Questionnaire   . Feeling of Stress : Very much  Social Connections: Moderately Integrated (08/25/2021)   Social Connection and Isolation Panel [NHANES]   . Frequency of Communication with Friends and Family: More than three times a week   . Frequency of Social Gatherings with Friends and Family: More than three times a week   . Attends Religious Services: More than 4 times per year   . Active Member of Clubs or Organizations: No   . Attends Banker Meetings: Never   . Marital Status: Married   Past Surgical History:  Procedure Laterality Date  . ABDOMINAL HYSTERECTOMY  06/2005   "w/right ovariy"  . AMPUTATION Right 01/28/2020     RIGHT FOURTH AND FIFTH RAY AMPUTATION  . AMPUTATION Right 01/28/2020   Procedure: RIGHT FOURTH AND FIFTH RAY AMPUTATION,;  Surgeon: [name], MD;  Location: [facility] OR;  Service: Orthopedics;  Laterality: Right;  . AMPUTATION Right 06/01/2021   Procedure: RIGHT BELOW KNEE AMPUTATION;  Surgeon: [name], MD;  Location: [facility] OR;  Service: Orthopedics;  Laterality: Right;  . AMPUTATION Right 07/22/2021   Procedure: RIGHT ABOVE KNEE AMPUTATION;  Surgeon: [name], MD;  Location: [facility] OR;  Service: Orthopedics;  Laterality: Right;  . APPENDECTOMY    . BREAST BIOPSY Bilateral    9 total (04/18/2018)  . CORONARY ANGIOPLASTY WITH STENT PLACEMENT  10/01/2015   normal LM, 30% LAD, 85% mid RCA and 95% distal RCA s/p PCI of the mid to distal RCA and now on DAPT with ASA and Ticagrelor.    . CORONARY STENT INTERVENTION Right 04/18/2018   Procedure: CORONARY STENT INTERVENTION;  Surgeon: [name], MD;  Location: [facility];  Service: Cardiovascular;  Laterality: Right;  . DILATION AND CURETTAGE OF UTERUS    . FRACTURE SURGERY    . LAPAROSCOPIC CHOLECYSTECTOMY    . LEFT HEART CATH AND CORONARY ANGIOGRAPHY N/A 04/18/2018   Procedure: LEFT HEART CATH AND CORONARY ANGIOGRAPHY;  Surgeon: [name], MD;  Location: [facility];  Service: Cardiovascular;  Laterality: N/A;  . MUSCLE BIOPSY Left    "leg"  . OOPHORECTOMY  07/2010  . RADIOLOGY WITH ANESTHESIA N/A 05/25/2016   Procedure: RADIOLOGY WITH ANESTHESIA;  Surgeon: Medication Radiologist, MD;  Location: [facility] OR;  Service: Radiology;  Laterality: N/A;  . STUMP REVISION Right 06/29/2021   Procedure: REVISION RIGHT BELOW KNEE AMPUTATION;  Surgeon: [name], MD;  Location: [facility] OR;  Service: Orthopedics;  Laterality: Right;  . TEE WITHOUT CARDIOVERSION N/A 06/01/2021   Procedure: TRANSESOPHAGEAL ECHOCARDIOGRAM (TEE);  Surgeon: Sande Rives, MD;  Location: [facility] OR;  Service: Cardiovascular;  Laterality: N/A;   . TONSILLECTOMY AND ADENOIDECTOMY    . WRIST FRACTURE SURGERY Left    "crushed it"   Past Surgical History:  Procedure Laterality Date  . ABDOMINAL HYSTERECTOMY  06/2005   "w/right ovariy"  . AMPUTATION Right 01/28/2020    RIGHT FOURTH AND FIFTH RAY AMPUTATION  . AMPUTATION Right 01/28/2020   Procedure: RIGHT FOURTH AND FIFTH RAY AMPUTATION,;  Surgeon: [name], MD;  Location: [facility] OR;  Service: Orthopedics;  Laterality: Right;  . AMPUTATION Right 06/01/2021   Procedure: RIGHT BELOW KNEE AMPUTATION;  Surgeon: [name], MD;  Location: [facility] OR;  Service: Orthopedics;  Laterality: Right;  . AMPUTATION Right 07/22/2021  Procedure: RIGHT ABOVE KNEE AMPUTATION;  Surgeon: [name], MD;  Location: [facility] OR;  Service: Orthopedics;  Laterality: Right;  . APPENDECTOMY    . BREAST BIOPSY Bilateral    9 total (04/18/2018)  . CORONARY ANGIOPLASTY WITH STENT PLACEMENT  10/01/2015   normal LM, 30% LAD, 85% mid RCA and 95% distal RCA s/p PCI of the mid to distal RCA and now on DAPT with ASA and Ticagrelor.    . CORONARY STENT INTERVENTION Right 04/18/2018   Procedure: CORONARY STENT INTERVENTION;  Surgeon: [name], MD;  Location: [facility];  Service: Cardiovascular;  Laterality: Right;  . DILATION AND CURETTAGE OF UTERUS    . FRACTURE SURGERY    . LAPAROSCOPIC CHOLECYSTECTOMY    . LEFT HEART CATH AND CORONARY ANGIOGRAPHY N/A 04/18/2018   Procedure: LEFT HEART CATH AND CORONARY ANGIOGRAPHY;  Surgeon: [name], MD;  Location: [facility];  Service: Cardiovascular;  Laterality: N/A;  . MUSCLE BIOPSY Left    "leg"  . OOPHORECTOMY  07/2010  . RADIOLOGY WITH ANESTHESIA N/A 05/25/2016   Procedure: RADIOLOGY WITH ANESTHESIA;  Surgeon: Medication Radiologist, MD;  Location: [facility] OR;  Service: Radiology;  Laterality: N/A;  . STUMP REVISION Right 06/29/2021   Procedure: REVISION RIGHT BELOW KNEE AMPUTATION;  Surgeon: [name], MD;  Location: [facility] OR;   Service: Orthopedics;  Laterality: Right;  . TEE WITHOUT CARDIOVERSION N/A 06/01/2021   Procedure: TRANSESOPHAGEAL ECHOCARDIOGRAM (TEE);  Surgeon: Sande Rives, MD;  Location: [facility] OR;  Service: Cardiovascular;  Laterality: N/A;  . TONSILLECTOMY AND ADENOIDECTOMY    . WRIST FRACTURE SURGERY Left    "crushed it"   Past Medical History:  Diagnosis Date  . Aortic stenosis    mild AS by echo 02/2021  . Asthma    "on daily RX and rescue inhaler" (04/18/2018)  . Chronic diastolic CHF (congestive heart failure) (HCC) 09/2015  . Chronic lower back pain   . Chronic neck pain   . Chronic pain syndrome    Fentanyl Patch  . Colon polyps   . Coronary artery disease    cath with normal LM, 30% LAD, 85% mid RCA and 95% distal RCA s/p PCI of the mid to distal RCA and now on DAPT with ASA and Ticagrelor.    . Eczema   . Excessive daytime sleepiness 11/26/2015  . Fibromyalgia   . Gallstones   . GERD (gastroesophageal reflux disease)   . Heart murmur    "noted for the 1st time on 04/18/2018"  . History of blood transfusion 07/2010   "S/P oophorectomy"  . History of gout   . History of hiatal hernia 1980s   "gone now" (04/18/2018)  . Hyperlipidemia   . Hypertension    takes Metoprolol and Enalapril daily  . Hypothyroidism    takes Synthroid daily  . IBS (irritable bowel syndrome)   . Migraine    "nothing in the 2000s" (04/18/2018)  . Mixed connective tissue disease (HCC)   . NAFLD (nonalcoholic fatty liver disease)   . Pneumonia    "several times" (04/18/2018)  . PVC's (premature ventricular contractions)    noted on event monitor 02/2021  . Rheumatoid arthritis (HCC)    "hands, elbows, shoulders, probably knees" (04/18/2018)  . Scoliosis   . Sjogren's syndrome (HCC)   . Sleep apnea    mild - does not use cpap  . Spondylosis   . Type II diabetes mellitus (HCC)    takes Metformin and Hum  R daily (04/18/2018)  . Walker as ambulation aid    also uses wheelchair   BP 124/70   Pulse 87    Resp (!) 96   Ht 5\' 2"  (1.575 m)   BMI 51.94 kg/m   Opioid Risk Score:   Fall Risk Score:  `1  Depression screen [facility] 2/9     01/30/2023   10:04 AM 01/01/2023    9:58 AM 11/28/2022    9:45 AM 08/22/2022   11:21 AM 07/07/2022    9:16 AM 06/27/2022   11:39 AM 05/02/2022   11:56 AM  Depression screen PHQ 2/9  Decreased Interest 1 1 1 1  0 1 1  Down, Depressed, Hopeless 1 1 1 1 1 1 1   PHQ - 2 Score 2 2 2 2 1 2 2     Review of Systems  Musculoskeletal:  Positive for back pain, gait problem and neck pain.       Left foot pain, pain in both elbows & both shoulders  All other systems reviewed and are negative.      Objective:   Physical Exam Vitals and nursing note reviewed.  Constitutional:      Appearance: Normal appearance.  Neck:     Comments: Cervical Paraspinal Tenderness: C-5-C-6 Cardiovascular:     Rate and Rhythm: Normal rate and regular rhythm.     Pulses: Normal pulses.     Heart sounds: Normal heart sounds. No murmur heard. Pulmonary:     Effort: Pulmonary effort is normal.     Breath sounds: Normal breath sounds.  Musculoskeletal:     Cervical back: Normal range of motion and neck supple.     Comments: Normal Muscle Bulk and Muscle Testing Reveals:  Upper Extremities: Decreased ROM 90 Degrees  and Muscle Strength 5/5 Bilateral AC Joint Tenderness : R>L  Lumbar Paraspinal Tenderness: L-3-L-5 Lower Extremities: Right: AKA Left Lower Extremity: Decreased ROM and Muscle Strength 4/5 Left Lower Extremity Flexion Produces Pain into her Left Patella and Left Lower Extremity Arrived in wheelchair     Skin:    General: Skin is warm and dry.     Comments: Skin Lesions on Bilateral Upper extremities: No Drainage Reddened Areas PCP following per [name]  Neurological:     Mental Status: She is alert and oriented to person, place, and time.  Psychiatric:        Mood and Affect: Mood normal.        Behavior: Behavior normal.         Assessment & Plan:  HX:  of Right AKA: Dr Lajoyce Corners Following. Continue to Monitor. 01/30/2023 Diabetic Polyneuropathy associated with Type 2 DM: Continue current medication regimen with Lyrica. Continue to Monitor. 01/30/2023 DM Type 2: PCP Following. Continue current medication regimen. Continue to Monitor. 01/30/2023 Chronic  Pain Syndrome : Refilled: Oxycodone 20 mg one tablet every 8 hours as needed for pain #90.  We will continue the opioid monitoring program, this consists of regular clinic visits, examinations, urine drug screen, pill counts as well as use of [place] Controlled Substance Reporting system. A 12 month History has been reviewed on the [place] Controlled Substance Reporting System on 01/30/2023 Stage 1 Decubitus: Left Buttock and Left Lower Extremity: Continue Adequate Nutrition and Weight Shifting.  Wound Care following. Continue to Monitor. 01/30/2023  6. Cellulitis: [name] was hospitalized on 06/14/2022- 06/20/2022, discharge summary was reviewed.  Continue to Monitpr. PCP Following.01/30/2023 7. Phantom Pain: Continue Lyrica. Continue to Monitor. 01/30/2023 8. Polyarthralgia: Continue HEP  as Tolerated. Continue to Monitor. 01/30/2023 9. Chronic Bilateral Shoulder Pain: Continue HEP as tolerated. Continue to Monitor.01/30/2023  10. Chronic Bilateral Lower Back Pain without Sciatica: Continue Lidocaine Patches 12 hours on and 12 hours off, she verbalizes understanding. Continue HEP as tolerated. Continue current medication regimen. Continue to Monitor. 01/30/2023 11. Greater Trochanter Bursitis:No complaints today.  Continue to Alternate Ice and Heat Therapy. Continue to Monitor. 01/30/2023 12. Left Foot Ulcer: Husband performing dressing changes. Wound Care Following and Dr Lajoyce Corners Following. Continue to Monitor. 01/30/2023

## 2023-01-31 ENCOUNTER — Telehealth: Payer: Commercial Managed Care - HMO

## 2023-01-31 ENCOUNTER — Telehealth: Payer: Self-pay | Admitting: Pharmacist

## 2023-01-31 NOTE — Telephone Encounter (Signed)
Unsuccessful outreach today - patient was to have follow up phone visit with Clinical Pharmacist Practitioner to see if she has started using Continuous Glucose Monitor and connect Continuous Glucose Monitor to our practice. Also need to follow up DM control. Noted that last A1c was not at goal and patient continues to have difficulty with poor wound healing.   LM on VM with Clinical Pharmacist Practitioner CB# [phone] or [phone].   Will have scheduler try to reach out to patient to rescheduled missed appt.

## 2023-02-08 ENCOUNTER — Encounter (HOSPITAL_BASED_OUTPATIENT_CLINIC_OR_DEPARTMENT_OTHER): Payer: Commercial Managed Care - HMO | Admitting: General Surgery

## 2023-02-08 DIAGNOSIS — E11621 Type 2 diabetes mellitus with foot ulcer: Secondary | ICD-10-CM | POA: Diagnosis not present

## 2023-02-12 ENCOUNTER — Other Ambulatory Visit: Payer: Self-pay | Admitting: Family Medicine

## 2023-02-16 ENCOUNTER — Ambulatory Visit: Payer: Commercial Managed Care - HMO | Admitting: Cardiology

## 2023-02-16 NOTE — Progress Notes (Deleted)
Virtual Visit via Video Note   Because of [name]'s co-morbid illnesses, she is at least at moderate risk for complications without adequate follow up.  This format is felt to be most appropriate for this patient at this time.  All issues noted in this document were discussed and addressed.  A limited physical exam was performed with this format.  Please refer to the patient's chart for her consent to telehealth for [facility].  Date:  02/16/2023   ID:  [name], DOB 1960-10-15, MRN [id number] The patient was identified using 2 identifiers.  Patient Location: Home Provider Location: Office/Clinic   PCP:  Sharlene Dory, DO   [facility] Providers Cardiologist:  Armanda Magic, MD     Evaluation Performed:  Follow-Up Visit  Chief Complaint:  PAF, CAD, HTN, HLD, DOE  History of Present Illness:    [name] is a 62 y.o. female with a hx of ASCAD with prior cath showing normal LM, 30% LAD, 85% mid RCA and 95% distal RCA s/p PCI of the mid to distal RCA. 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   Repeat cath for CP and heartburn 04/19/18 showed severe stenosis of the ostial RCA, 99% stenosed, treated with successful PCI + DES placement. The previously placed stents in the mid and distal RCA were patent with only minimal stenosis. The LAD had only mild, 20%, disease. She was placed on DAPT w/ ASA and Plavix.  Initially beta-blocker was started but had to be stopped due to wheezing and asthma.  She was referred to lipid clinic for consideration of PCSK9 inhibitor but never followed through.     She also has a hx  of A. fib with RVR in the setting of sepsis and bacteremia  and converted to NSR and has not had any further A. fib.  She was on Lopressor but no anticoagulation.  Her CHADS2VASC score was 5 but due to the very brief nature of her A. fib in the setting of acute infection and respiratory failure it was recommended to hold off on anticoagulation unless she had further episodes of atrial fibrillation.  Her BB was changed to Cardizem due to wheezing and asthma .  Admitted 12/2021 with a/c CHF w/ volume overload in the setting of medication noncompliance and Lt LE cellulitis. Diuresed with IV lasix and treated w/ IV abx. Transitioned to lasix 40 mg bid. Valsartan was discontinued due to hyperkalemia but she was continued on spironolactone 25 mg daily. D/c Wt 283 lb.    Seen in [facility] on 01/02/22 . Labs showed bump in SCr, from 0.80>>1.32. BNP was normal. Lasix reduced to once daily.    Had f/u w/ PCP on 01/06/22. F/u BMP showed further increase in SCr to 1.46. K was elevated at 5.2 (no hemolysis). PCP added Farxiga 10 mg daily.    Last seen in May 2023 by heart failure clinic and was doing well.  She was continued on Lasix 40 mg daily and Farxiga 10 mg daily as a Spiro 25 mg daily.  She is here today for followup and is doing well.  She denies any chest pain or pressure, SOB, DOE, PND, orthopnea, LE edema, dizziness, palpitations or syncope. She is compliant  with her meds and is tolerating meds with no SE.     Past Medical History:  Diagnosis Date   Aortic stenosis    mild AS by echo 02/2021   Asthma    "on daily RX and rescue inhaler" (04/18/2018)   Chronic diastolic CHF (congestive heart failure) (HCC) 09/2015   Chronic lower back pain    Chronic neck pain    Chronic pain syndrome    Fentanyl Patch   Colon polyps    Coronary artery disease    cath with normal LM, 30% LAD, 85% mid RCA and 95% distal RCA s/p PCI of the mid to distal RCA and now on DAPT with ASA and Ticagrelor.     Eczema    Excessive daytime sleepiness 11/26/2015   Fibromyalgia     Gallstones    GERD (gastroesophageal reflux disease)    Heart murmur    "noted for the 1st time on 04/18/2018"   History of blood transfusion 07/2010   "S/P oophorectomy"   History of gout    History of hiatal hernia 1980s   "gone now" (04/18/2018)   Hyperlipidemia    Hypertension    takes Metoprolol and Enalapril daily   Hypothyroidism    takes Synthroid daily   IBS (irritable bowel syndrome)    Migraine    "nothing in the 2000s" (04/18/2018)   Mixed connective tissue disease (HCC)    NAFLD (nonalcoholic fatty liver disease)    Pneumonia    "several times" (04/18/2018)   PVC's (premature ventricular contractions)    noted on event monitor 02/2021   Rheumatoid arthritis (HCC)    "hands, elbows, shoulders, probably knees" (04/18/2018)   Scoliosis    Sjogren's syndrome (HCC)    Sleep apnea    mild - does not use cpap   Spondylosis    Type II diabetes mellitus (HCC)    takes Metformin and Hum R daily (04/18/2018)   Walker as ambulation aid    also uses wheelchair   Past Surgical History:  Procedure Laterality Date   ABDOMINAL HYSTERECTOMY  06/2005   "w/right ovariy"   AMPUTATION Right 01/28/2020    RIGHT FOURTH AND FIFTH RAY AMPUTATION   AMPUTATION Right 01/28/2020   Procedure: RIGHT FOURTH AND FIFTH RAY AMPUTATION,;  Surgeon: [name], MD;  Location: [facility] OR;  Service: Orthopedics;  Laterality: Right;   AMPUTATION Right 06/01/2021   Procedure: RIGHT BELOW KNEE AMPUTATION;  Surgeon: [name], MD;  Location: [facility] OR;  Service: Orthopedics;  Laterality: Right;   AMPUTATION Right 07/22/2021   Procedure: RIGHT ABOVE KNEE AMPUTATION;  Surgeon: [name], MD;  Location[facility] OR;  Service: Orthopedics;  Laterality: Right;   APPENDECTOMY     BREAST BIOPSY Bilateral    9 total (04/18/2018)   CORONARY ANGIOPLASTY WITH STENT PLACEMENT  10/01/2015   normal LM, 30% LAD, 85% mid RCA and 95% distal RCA s/p PCI of the mid to distal RCA and now on DAPT with ASA and Ticagrelor.     CORONARY  STENT INTERVENTION Right 04/18/2018   Procedure: CORONARY STENT INTERVENTION;  Surgeon: [name], MD;  Location: [facility] INVASIVE CV LAB;  Service: Cardiovascular;  Laterality: Right;   DILATION AND CURETTAGE OF UTERUS     FRACTURE SURGERY     LAPAROSCOPIC CHOLECYSTECTOMY     LEFT HEART CATH AND CORONARY ANGIOGRAPHY N/A 04/18/2018   Procedure: LEFT HEART CATH AND CORONARY ANGIOGRAPHY;  Surgeon: [name], MD;  Location: [facility] INV[facility] CV  LAB;  Service: Cardiovascular;  Laterality: N/A;   MUSCLE BIOPSY Left    "leg"   OOPHORECTOMY  07/2010   RADIOLOGY WITH ANESTHESIA N/A 05/25/2016   Procedure: RADIOLOGY WITH ANESTHESIA;  Surgeon: Medication Radiologist, MD;  Location: [facility] OR;  Service: Radiology;  Laterality: N/A;   STUMP REVISION Right 06/29/2021   Procedure: REVISION RIGHT BELOW KNEE AMPUTATION;  Surgeon: [name], MD;  Location: [facility] OR;  Service: Orthopedics;  Laterality: Right;   TEE WITHOUT CARDIOVERSION N/A 06/01/2021   Procedure: TRANSESOPHAGEAL ECHOCARDIOGRAM (TEE);  Surgeon: Sande Rives, MD;  Location: [facility] OR;  Service: Cardiovascular;  Laterality: N/A;   TONSILLECTOMY AND ADENOIDECTOMY     WRIST FRACTURE SURGERY Left    "crushed it"     No outpatient medications have been marked as taking for the 02/16/23 encounter (Appointment) with Quintella Reichert, MD.     Allergies:   Fish allergy, Fish oil, Gabapentin, Ipratropium-albuterol, Omega-3 fatty acids, Peanut-containing drug products, Victoza [liraglutide], Lovastatin, Metronidazole, Penicillins, Red yeast rice [cholestin], Rosuvastatin, Rotigotine, Atrovent hfa [ipratropium bromide hfa], Iodine, Ipratropium bromide, Morphine, Other, Pepto-bismol [bismuth subsalicylate], Wound dressing adhesive, Enalapril, Suprep [na sulfate-k sulfate-mg sulf], and Tape   Social History   Tobacco Use   Smoking status: Former    Packs/day: 1.00    Years: 19.00    Additional pack years: 0.00    Total pack years: 19.00     Types: Cigarettes    Start date: 31    Quit date: 02/11/2004    Years since quitting: 19.0    Passive exposure: Past   Smokeless tobacco: Never  Vaping Use   Vaping Use: Never used  Substance Use Topics   Alcohol use: Yes    Comment: RARE Wine   Drug use: Not Currently    Types: Marijuana    Comment: "only in my teens"     Family Hx: The patient's family history includes Allergic rhinitis in her father and son; Asthma in her father and son; Breast cancer in her paternal aunt; CAD in her father and mother; Heart attack in her father and mother; Hypertension in her brother, brother, and mother; Lung cancer in her paternal aunt; Pancreatic cancer in her paternal aunt; Stroke in her mother.  ROS:   Please see the history of present illness.     All other systems reviewed and are negative.   Prior CV studies:   The following studies were reviewed today:  none  Labs/Other Tests and Data Reviewed:    EKG:  No ECG reviewed.  Recent Labs: 06/16/2022: Magnesium 1.8 07/05/2022: Hemoglobin 9.8 Repeated and verified X2.; Platelets 317.0 01/08/2023: ALT 24; BUN 14; Creatinine, Ser 0.88; Potassium 3.9; Sodium 140; TSH 4.21   Recent Lipid Panel Lab Results  Component Value Date/Time   CHOL 182 01/08/2023 11:06 AM   CHOL 176 05/04/2020 09:47 AM   TRIG 114.0 01/08/2023 11:06 AM   HDL 47.00 01/08/2023 11:06 AM   HDL 41 05/04/2020 09:47 AM   CHOLHDL 4 01/08/2023 11:06 AM   LDLCALC 112 (H) 01/08/2023 11:06 AM   LDLCALC 83 05/26/2022 03:01 PM   LDLDIRECT 121.0 10/27/2020 08:09 AM    Wt Readings from Last 3 Encounters:  11/28/22 284 lb (128.8 kg)  08/01/22 284 lb (128.8 kg)  07/26/22 (!) 309 lb (140.2 kg)         Objective:    Vital Signs:  There were no vitals taken for this visit.  GEN: Well nourished, well developed in no acute distress  HEENT: Normal NECK: No JVD; No carotid bruits LYMPHATICS: No lymphadenopathy CARDIAC:RRR, no murmurs, rubs, gallops RESPIRATORY:   Clear to auscultation without rales, wheezing or rhonchi  ABDOMEN: Soft, non-tender, non-distended MUSCULOSKELETAL:  No edema; No deformity  SKIN: Warm and dry NEUROLOGIC:  Alert and oriented x 3 PSYCHIATRIC:  Normal affect  ASSESSMENT & PLAN:    1.  PAF  -this occurred in the setting of sepsis and bacteremia -She is maintaining normal sinus rhythm today and denies any palpitations -She was not placed on anticoagulation even with CHADS2VASC score of 5 since this occurred in the setting of acute illness.  Decision was to start DOAC if she had a reoccurrence.  -Her BB was stopped due to asthma  -Event monitor in July 2022 showed no evidence of A-fib -Continue prescription drug management with Cardizem CD2 140 mg daily with as needed refills  2.  ASCAD  - cath 04/2018 showed severe stenosis of the ostial RCA, 99% stenosed, treated with successful PCI + DES placement. The previously placed stents in the mid and distal RCA were patent with only minimal stenosis. The LAD had only mild, 20%, disease. -She has not had any anginal symptoms since I saw her last -Continue prescription management with aspirin 81 mg daily, Imdur 30 mg daily with as needed refills -She is statin intolerant   3.  Hypertension  -BP controlled on exam today -Continue prescription drug management with Cardizem CD 240 mg daily with as needed refills    4.  Hyperlipidemia -she is statin intolerant.  -She was referred Nexlizet but did not start it. -I have personally reviewed and interpreted outside labs performed by patient's PCP which showed LDL 112 and HDL 47 on 01/08/2023 -I will get her back into lipid clinic  5.  RA with MCTD  -No evidence of pulmonary hypertension on echo 05/2021 -Repeat 2D echo to reassess symptoms been 2 years   6.  DOE -I suspect that this is multifactorial from morbid obesity with increased weight gain, asthma, sedentary state, fibromyalgia and deconditioning.  -2D echo 05/31/2021 showed  normal LV function with EF 55 to 60% -Lexiscan Myoview 02/2021 showed no ischemia  7.  Chronic diastolic CHF -She appears euvolemic on exam today -Continue Lasix 40 mg daily -I have personally reviewed and interpreted outside labs performed by patient's PCP which showed serum creatinine 0.88 and potassium 3.9 on 01/08/2023    Medication Adjustments/Labs and Tests Ordered: Current medicines are reviewed at length with the patient today.  Concerns regarding medicines are outlined above.   Tests Ordered: No orders of the defined types were placed in this encounter.   Medication Changes: No orders of the defined types were placed in this encounter.   Follow Up:  In Person in 6 month(s)  Signed, Armanda Magic, MD  02/16/2023 8:34 AM    [facility]

## 2023-02-17 ENCOUNTER — Other Ambulatory Visit: Payer: Self-pay | Admitting: Family Medicine

## 2023-02-20 ENCOUNTER — Ambulatory Visit (HOSPITAL_BASED_OUTPATIENT_CLINIC_OR_DEPARTMENT_OTHER): Payer: Commercial Managed Care - HMO | Admitting: General Surgery

## 2023-02-20 NOTE — Progress Notes (Signed)
Hodgdon, Trude [name] ([id number]) [id number] Page 1 of 10 Visit Report for 01/10/2023 Arrival Information Details Patient Name: Date of Service: [name], [name] 01/10/2023 10:45 A [name] Medical Record Number: [id number] Patient Account Number: [id number] Date of Birth/Sex: Treating RN: 1961/05/16 (62 y.o. Gevena Mart Primary Care Kirin Pastorino: [name] Other Clinician: Referring Cope Marte: Treating Shadd Dunstan/Extender: Vivien Rossetti in Treatment: 25 Visit Information History Since Last Visit All ordered tests and consults were completed: Yes Patient Arrived: Wheel Chair Added or deleted any medications: No Arrival Time: 11:00 Any new allergies or adverse reactions: No Accompanied By: husband Had a fall or experienced change in No Transfer Assistance: None activities of daily living that may affect Patient Identification Verified: Yes risk of falls: Secondary Verification Process Completed: Yes Signs or symptoms of abuse/neglect since last visito No Patient Requires Transmission-Based Precautions: No Hospitalized since last visit: No Patient Has Alerts: No Implantable device outside of the clinic excluding No cellular tissue based products placed in the center since last visit: Has Dressing in Place as Prescribed: Yes Pain Present Now: Yes Electronic Signature(s) Signed: 02/20/2023 7:49:05 AM By: Brenton Grills Entered By: Brenton Grills on 01/10/2023 11:01:23 -------------------------------------------------------------------------------- Encounter Discharge Information Details Patient Name: Date of Service: [name], [name] 01/10/2023 10:45 A [name] Medical Record Number: [id number] Patient Account Number: [id number] Date of Birth/Sex: Treating RN: 1961-08-27 (62 y.o. Gevena Mart Primary Care Modene Andy: [name] Other Clinician: Referring Rogelio Waynick: Treating Geraldene Eisel/Extender: Vivien Rossetti in Treatment: 25 Encounter Discharge Information Items Post Procedure Vitals Discharge Condition: Stable Temperature (F): 98.5 Ambulatory Status: Wheelchair Pulse (bpm): 82 Discharge Destination: Home Respiratory Rate (breaths/min): 20 Transportation: Private Auto Blood Pressure (mmHg): 119/81 Accompanied By: spouse Schedule Follow-up Appointment: Yes Clinical Summary of Care: Patient Declined Electronic Signature(s) Signed: 02/20/2023 7:49:05 AM By: Brenton Grills Entered By: Brenton Grills on 01/10/2023 12:08:39 -------------------------------------------------------------------------------- Lower Extremity Assessment Details Patient Name: Date of Service: [name], [name] 01/10/2023 10:45 A [name] Medical Record Number: [id number] Patient Account Number: [id number] Date of Birth/Sex: Treating RN: [birth year] (62 y.o. Gevena Mart Primary Care Kymari Lollis: [name] Other Clinician: Referring Kirby Cortese: Treating Aiyana Stegmann/Extender: Vivien Rossetti in Treatment: 25 Edema Assessment R[Left: IFFELL, Miachel Roux [phone] Franne Forts: [id number] Page 2 of 10] Assessed: [Left: No] [Right: No] Edema: [Left: Ye] [Right: s] Calf Left: Right: Point of Measurement: 30 cm From Medial Instep 47 cm Ankle Left: Right: Point of Measurement: 9 cm From Medial Instep 25.5 cm Vascular Assessment Pulses: Dorsalis Pedis Palpable: [Left:Yes] Electronic Signature(s) Signed: 02/20/2023 7:49:05 AM By: Brenton Grills Entered By: Brenton Grills on 01/10/2023 11:10:30 -------------------------------------------------------------------------------- Multi Wound Chart Details Patient Name: Date of Service: [name], [name] 01/10/2023 10:45 A [name] Medical Record Number: [id number] Patient Account Number: [id number] Date of Birth/Sex: Treating RN: 03/19/1961 (62 y.o. 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 Primary Etiology: Extremity Cataracts, Lymphedema, Asthma, Cataracts, Lymphedema, Asthma, Cataracts, Lymphedema, Asthma, Comorbid History: Congestive Heart Failure, Coronary Congestive Heart Failure, Coronary Congestive Heart Failure, Coronary Artery Disease, Hypertension, Artery Disease, Hypertension, Artery Disease, Hypertension, Peripheral Arterial Disease, Peripheral Peripheral Arterial Disease, Peripheral Peripheral Arterial Disease, Peripheral Venous Disease,  Type II Diabetes, Venous Disease, Type II Diabetes, Venous Disease, Type II Diabetes, Rheumatoid Arthritis, Neuropathy Rheumatoid Arthritis, Neuropathy Rheumatoid Arthritis, Neuropathy 05/17/2022 12/14/2022 12/14/2022 Date Acquired: 25 2 2  Weeks of Treatment: Open Open Open Wound Status: No No No Wound Recurrence: No Yes Yes Clustered Wound: 7x2x0.1 7x4x0.1 1x2.5x0.1 Measurements L x W x D (cm) 10.996 21.991 1.963 A (cm) : rea 1.1 2.199 0.196 Volume (cm) : 51.00% 53.30% 16.70% % Reduction in Area: 87.80% 53.30% 16.90% % Reduction in Volume: Grade 2 Full Thickness Without Exposed Full Thickness Without Exposed Classification: Support Structures Support Structures Medium Medium Medium Exudate Amount: Serosanguineous Serosanguineous Serosanguineous Exudate Type: Gidney, Miachel Roux ([id number]) [id number] Page 3 of 10 red, brown red, brown red, brown Exudate Color: Distinct, outline attached N/A N/A Wound Margin: Large (67-100%)  Medium (34-66%) Large (67-100%) Granulation Amount: Pink Pink Red Granulation Quality: Small (1-33%) Medium (34-66%) Small (1-33%) Necrotic Amount: Fat Layer (Subcutaneous Tissue): Yes Fat Layer (Subcutaneous Tissue): Yes Fat Layer (Subcutaneous Tissue): Yes Exposed Structures: Fascia: No Fascia: No Fascia: No Tendon: No Tendon: No Tendon: No Muscle: No Muscle: No Muscle: No Joint: No Joint: No Joint: No Bone: No Bone: No Bone: No Medium (34-66%) N/A None Epithelialization: Debridement - Selective/Open Wound Debridement - Excisional Debridement - Excisional Debridement: Pre-procedure Verification/Time Out 11:44 11:44 11:44 Taken: Lidocaine 4% Topical Solution Lidocaine 4% Topical Solution Lidocaine 4% Topical Solution Pain Control: N/A Necrotic/Eschar, Subcutaneous, Subcutaneous, Slough Tissue Debrided: Slough Non-Viable Tissue Skin/Subcutaneous Tissue Skin/Subcutaneous Tissue Level: 10.99 21.98 1.96 Debridement A (sq cm): rea Curette Curette Curette Instrument: Minimum Minimum Minimum Bleeding: Pressure Pressure Pressure Hemostasis Achieved: 0 0 0 Procedural Pain: 0 0 0 Post Procedural Pain: Procedure was tolerated well Procedure was tolerated well Procedure was tolerated well Debridement Treatment Response: 7x2x1 7x4x0.1 1x2.5x0.1 Post Debridement Measurements L x W x D (cm) 10.996 2.199 0.196 Post Debridement Volume: (cm) Callus: Yes No Abnormalities Noted No Abnormalities Noted Periwound Skin Texture: Scarring: Yes Excoriation: No Induration: No Crepitus: No Rash: No Dry/Scaly: Yes No Abnormalities Noted No Abnormalities Noted Periwound Skin Moisture: Maceration: No Atrophie Blanche: No No Abnormalities Noted No Abnormalities Noted Periwound Skin Color: Cyanosis: No Ecchymosis: No Erythema: No Hemosiderin Staining: No Mottled: No Pallor: No Rubor: No No Abnormality No Abnormality No Abnormality Temperature: Debridement Debridement  Debridement Procedures Performed: Treatment Notes Wound #19 (Foot) Wound Laterality: Plantar, Left Cleanser Soap and Water Discharge Instruction: May shower and wash wound with dial antibacterial soap and water prior to dressing change. Wound Cleanser Discharge Instruction: Cleanse the wound with wound cleanser prior to applying a clean dressing using gauze sponges, not tissue or cotton balls. Peri-Wound Care Topical Primary Dressing Maxorb Extra Ag+ Alginate Dressing, 4x4.75 (in/in) Discharge Instruction: Apply to wound bed as instructed Secondary Dressing ABD Pad, 5x9 Discharge Instruction: Apply over primary dressing as directed. Woven Gauze Sponge, Non-Sterile 4x4 in Discharge Instruction: Apply over primary dressing as directed. Secured With Elastic Bandage 4 inch (ACE bandage) Discharge Instruction: Secure with ACE bandage as directed. Kerlix Roll Sterile, 4.5x3.1 (in/yd) Discharge Instruction: Secure with Kerlix as directed. 32M Medipore Soft Cloth Surgical T 2x10 (in/yd) ape Discharge Instruction: Secure with tape as directed. 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) Wound Laterality: Right Cleanser Peri-Wound Care Topical Primary Dressing Secondary Dressing Secured With Compression Wrap Compression Stockings Add-Ons Wound #21 (Pubis) Wound Laterality: Medial Cleanser Peri-Wound Care Topical Primary Dressing Secondary Dressing Secured With Compression Wrap Compression Stockings Add-Ons  Wound #22 (Upper Leg) Wound Laterality: Left, Medial Cleanser Peri-Wound Care Topical Primary Dressing Secondary Dressing Secured With Compression Wrap Compression Stockings Add-Ons Wound #23 (Umbilicus) Wound Laterality: Midline Cleanser Peri-Wound Care Topical Primary Dressing MediHoney Gel, tube 1.5 (oz) Discharge Instruction: Apply to  wound bed as instructed Secondary Dressing ABD Pad, 8x10 Discharge Instruction: Apply over primary dressing as directed. Secured With 44M Medipore H Soft Cloth Surgical Tape, 4 x 10 (in/yd) [name], [name] ([id number]) [phone].pdf Page 5 of 10 Discharge Instruction: Secure with tape as directed. Compression Wrap Compression Stockings Add-Ons Wound #24 (Abdomen - midline) Cleanser Peri-Wound Care Topical Ketoconazole Cream 2% Discharge Instruction: Apply Ketoconazole as directed Interdry Discharge Instruction: Apply to under Pannus Primary Dressing Secondary Dressing Secured With Compression Wrap Compression Stockings Add-Ons Electronic Signature(s) Signed: 01/10/2023 12:08:32 PM By: Duanne Guess MD FACS Entered By: Duanne Guess on 01/10/2023 12:08:31 -------------------------------------------------------------------------------- Multi-Disciplinary Care Plan Details Patient Name: Date of Service: [name], [name] 01/10/2023 10:45 A [name] Medical Record Number: [id number] Patient Account Number: [id number] Date of Birth/Sex: Treating RN: 1961/01/30 (62 y.o. Gevena Mart Primary Care Cardelia Sassano: [name] Other Clinician: Referring Michell Giuliano: Treating Shirel Mallis/Extender: Vivien Rossetti in Treatment: 25 Multidisciplinary Care Plan reviewed with physician Active Inactive Venous Leg Ulcer Nursing Diagnoses: Potential for venous Insuffiency (use before diagnosis confirmed) Goals: Patient will maintain optimal edema control Date Initiated: 07/19/2022 Target Resolution Date: 01/11/2026 Goal Status: Active Interventions: Assess peripheral edema status every visit. Treatment Activities: Therapeutic compression applied : 07/19/2022 Notes: Wound/Skin Impairment Nursing Diagnoses: Impaired tissue integrity Knowledge deficit related to ulceration/compromised skin integrity Podolsky, Annya [name] ([id number])  [id number] Page 6 of 10 Goals: Patient/caregiver will verbalize understanding of skin care regimen Date Initiated: 11/14/2022 Target Resolution Date: 01/11/2026 Goal Status: Active Interventions: Assess patient/caregiver ability to obtain necessary supplies Assess patient/caregiver ability to perform ulcer/skin care regimen upon admission and as needed Assess ulceration(s) every visit Treatment Activities: Skin care regimen initiated : 11/14/2022 Topical wound management initiated : 11/14/2022 Notes: Electronic Signature(s) Signed: 02/20/2023 7:49:05 AM By: Brenton Grills Entered By: Brenton Grills on 01/10/2023 12:04:53 -------------------------------------------------------------------------------- Pain Assessment Details Patient Name: Date of Service: Talarico, [name] 01/10/2023 10:45 A [name] Medical Record Number: [id number] Patient Account Number: [id number] Date of Birth/Sex: Treating RN: 10/26/1960 (62 y.o. Gevena Mart Primary Care Cosme Jacob: [name] Other Clinician: Referring Virgene Tirone: Treating Kenyatta Gloeckner/Extender: Vivien Rossetti in Treatment: 25 Active Problems Location of Pain Severity and Description of Pain Patient Has Paino Yes Site Locations Duration of the Pain. Constant / Intermittento Constant Rate the pain. Current Pain Level: 8 Worst Pain Level: 8 Least Pain Level: 8 Pain Management and Medication Current Pain Management: Electronic Signature(s) Signed: 02/20/2023 7:49:05 AM By: Brenton Grills Entered By: Brenton Grills on 01/10/2023 11:03:35 -------------------------------------------------------------------------------- Patient/Caregiver Education Details Patient Name: Date of Service: [name], [name] 5/1/2024andnbsp10:45 A [name] Medical Record Number: [id number] Patient Account Number: [id number] Date of Birth/Gender: Treating RN: 1961/07/01 (63 y.o. Gevena Mart Primary Care Physician:  [name] Other Clinician: Referring Physician: Treating Physician/Extender: Ocie Doyne Hickok, Tarisha [name] ([id number]) [id number] Page 7 of 10 Weeks in Treatment: 25 Education Assessment Education Provided To: Patient Education Topics Provided Wound/Skin Impairment: Methods: Explain/Verbal Responses: State content correctly [employer]) Signed: 02/20/2023 7:49:05 AM By: Brenton Grills Entered By: Brenton Grills on 01/10/2023 11:35:24 -------------------------------------------------------------------------------- Wound Assessment Details Patient Name: Date of Service: [name], [name] 01/10/2023 10:45 A [name] Medical Record Number: [id number] Patient Account Number: [id number] Date of Birth/Sex:  Treating RN: 1961/01/02 (62 y.o. Gevena Mart Primary Care Amed Datta: [name] Other Clinician: Referring Salif Tay: Treating Meredyth Hornung/Extender: Vivien Rossetti in Treatment: 25 Wound Status Wound Number: 19 Primary Diabetic Wound/Ulcer of the Lower Extremity Etiology: Wound Location: Left, Plantar Foot Wound Open Wounding Event: Gradually Appeared Status: Date Acquired: 05/17/2022 Comorbid Cataracts, Lymphedema, Asthma, Congestive Heart Failure, Weeks Of Treatment: 25 History: Coronary Artery Disease, Hypertension, Peripheral Arterial Disease, Clustered Wound: No Peripheral Venous Disease, Type II Diabetes, Rheumatoid Arthritis, Neuropathy Photos Wound Measurements Length: (cm) 7 Width: (cm) 2 Depth: (cm) 0.1 Area: (cm) 10.996 Volume: (cm) 1.1 % Reduction in Area: 51% % Reduction in Volume: 87.8% Epithelialization: Medium (34-66%) Tunneling: No Undermining: No Wound Description Classification: Grade 2 Wound Margin: Distinct, outline attached Exudate Amount: Medium Exudate Type: Serosanguineous Exudate Color: red, brown Foul Odor After Cleansing: No Slough/Fibrino  Yes Wound Bed Granulation Amount: Large (67-100%) Exposed Structure Granulation Quality: Pink Fascia Exposed: No Necrotic Amount: Small (1-33%) Fat Layer (Subcutaneous Tissue) Exposed: Yes Necrotic Quality: Adherent Slough Tendon Exposed: No Hoheisel, Jadynn [name] ([id number]) [id number] Page 8 of 10 Muscle Exposed: No Joint Exposed: No Bone Exposed: No Periwound Skin Texture Texture Color No Abnormalities Noted: No No Abnormalities Noted: No Callus: Yes Atrophie Blanche: No Crepitus: No Cyanosis: No Excoriation: No Ecchymosis: No Induration: No Erythema: No Rash: No Hemosiderin Staining: No Scarring: Yes Mottled: No Pallor: No Moisture Rubor: No No Abnormalities Noted: Yes Temperature / Pain Temperature: No Abnormality Electronic Signature(s) Signed: 02/20/2023 7:49:05 AM By: Brenton Grills Entered By: Brenton Grills on 01/10/2023 11:31:33 -------------------------------------------------------------------------------- Wound Assessment Details Patient Name: Date of Service: [name], [name] 01/10/2023 10:45 A [name] Medical Record Number: [id number] Patient Account Number: [id number] Date of Birth/Sex: Treating RN: Jul 16, 1961 (62 y.o. Gevena Mart Primary Care Eniyah Eastmond: [name] Other Clinician: Referring Drena Ham: Treating Jakaya Jacobowitz/Extender: Vivien Rossetti in Treatment: 25 Wound Status Wound Number: 23 Primary Abrasion Etiology: Wound Location: Midline Umbilicus Wound Open Wounding Event: Other Lesion Status: Date Acquired: 12/14/2022 Comorbid Cataracts, Lymphedema, Asthma, Congestive Heart Failure, Weeks Of Treatment: 2 History: Coronary Artery Disease, Hypertension, Peripheral Arterial Disease, Clustered Wound: Yes Peripheral Venous Disease, Type II Diabetes, Rheumatoid Arthritis, Neuropathy Photos Wound Measurements Length: (cm) 7 Width: (cm) 4 Depth: (cm) 0.1 Area: (cm) 21.991 Volume: (cm)  2.199 % Reduction in Area: 53.3% % Reduction in Volume: 53.3% Tunneling: No Undermining: No Wound Description Classification: Full Thickness Without Exposed Support Structures Exudate Amount: Medium Exudate Type: Serosanguineous Exudate Color: red, brown Foul Odor After Cleansing: No Slough/Fibrino Yes Wound Bed Granulation Amount: Medium (34-66%) Exposed Structure Kiraly, Luree [name] ([id number]) [id number] Page 9 of 10 Granulation Quality: Pink Fascia Exposed: No Necrotic Amount: Medium (34-66%) Fat Layer (Subcutaneous Tissue) Exposed: Yes Tendon Exposed: No Muscle Exposed: No Joint Exposed: No Bone Exposed: No Periwound Skin Texture Texture Color No Abnormalities Noted: Yes No Abnormalities Noted: Yes Moisture Temperature / Pain No Abnormalities Noted: Yes Temperature: No Abnormality Electronic Signature(s) Signed: 02/20/2023 7:49:05 AM By: Brenton Grills Entered By: Brenton Grills on 01/10/2023 11:33:17 -------------------------------------------------------------------------------- Wound Assessment Details Patient Name: Date of Service: [name], [name] 01/10/2023 10:45 A [name] Medical Record Number: [id number] Patient Account Number: [id number] Date of Birth/Sex: Treating RN: 09-28-1960 (62 y.o. Gevena Mart Primary Care Pahoua Schreiner: [name] Other Clinician: Referring Sydny Schnitzler: Treating Rola Lennon/Extender: Vivien Rossetti in Treatment: 25 Wound Status Wound Number: 24 Primary Lesion Etiology: Wound Location: Abdomen - midline Wound Open Wounding Event: Other Lesion Status: Date Acquired: 12/14/2022 Notes: PANNUS  Weeks Of Treatment: 2 Comorbid Cataracts, Lymphedema, Asthma, Congestive Heart Failure, Clustered Wound: Yes History: Coronary Artery Disease, Hypertension, Peripheral Arterial Disease, Peripheral Venous Disease, Type II Diabetes, Rheumatoid Arthritis, Neuropathy Photos Wound  Measurements Length: (cm) 1 Width: (cm) 2.5 Depth: (cm) 0.1 Area: (cm) 1.963 Volume: (cm) 0.196 % Reduction in Area: 16.7% % Reduction in Volume: 16.9% Epithelialization: None Tunneling: No Undermining: No Wound Description Classification: Full Thickness Without Exposed Su Exudate Amount: Medium Exudate Type: Serosanguineous Exudate Color: red, brown pport Structures Foul Odor After Cleansing: No Slough/Fibrino Yes Wound Bed Granulation Amount: Large (67-100%) Exposed Structure Granulation Quality: Red Fascia Exposed: No Necrotic Amount: Small (1-33%) Fat Layer (Subcutaneous Tissue) Exposed: Yes Necrotic Quality: Adherent Slough Tendon Exposed: No Muscle Exposed: No Slaven, Sae [name] ([id number]) [id number] Page 10 of 10 Joint Exposed: No Bone Exposed: No Periwound Skin Texture Texture Color No Abnormalities Noted: Yes No Abnormalities Noted: Yes Moisture Temperature / Pain No Abnormalities Noted: Yes Temperature: No Abnormality Electronic Signature(s) Signed: 02/20/2023 7:49:05 AM By: Brenton Grills Entered By: Brenton Grills on 01/10/2023 11:34:02 -------------------------------------------------------------------------------- Vitals Details Patient Name: Date of Service: [name], [name] 01/10/2023 10:45 A [name] Medical Record Number: [id number] Patient Account Number: [id number] Date of Birth/Sex: Treating RN: 11/18/1960 (62 y.o. Gevena Mart Primary Care Saifullah Jolley: [name] Other Clinician: Referring Maansi Wike: Treating Ojas Coone/Extender: Vivien Rossetti in Treatment: 25 Vital Signs Time Taken: 11:00 Temperature (F): 98.5 Height (in): 62 Pulse (bpm): 90 Weight (lbs): 284 Respiratory Rate (breaths/min): 18 Body Mass Index (BMI): 51.9 Blood Pressure (mmHg): 118/72 Reference Range: 80 - 120 mg / dl Notes Forgot to take BS this morning Electronic Signature(s) Signed: 02/20/2023 7:49:05 AM By:  Brenton Grills Entered By: Brenton Grills on 01/10/2023 11:02:38

## 2023-02-20 NOTE — Progress Notes (Signed)
[name] ([id number]) [id number] Page 1 of 16 Visit Report for 01/10/2023 Chief Complaint Document Details Patient Name: Date of Service: [name], [name]. 01/10/2023 10:45 A [name] Medical Record Number: [id number] Patient Account Number: [id number] Date of Birth/Sex: Treating RN: 25-Sep-1960 (62 y.o. F) Primary Care Provider: [name] Other Clinician: Referring Provider: Treating Provider/Extender: Vivien Rossetti in Treatment: 25 Information Obtained from: Patient Chief Complaint 10/17/2021; multiple scattered wounds to the abdomen, posterior left leg, left labia and sacrum 07/19/2022: left foot DFU Electronic Signature(s) Signed: 01/10/2023 12:08:45 PM By: Duanne Guess MD FACS Entered By: Duanne Guess on 01/10/2023 12:08:45 -------------------------------------------------------------------------------- Debridement Details Patient Name: Date of Service: [name], [name] 01/10/2023 10:45 A [name] Medical Record Number: [id number] Patient Account Number: [id number] Date of Birth/Sex: Treating RN: Jul 14, 1961 (62 y.o. Gevena Mart Primary Care Provider: [name] Other Clinician: Referring Provider: Treating Provider/Extender: Vivien Rossetti in Treatment: 25 Debridement Performed for Assessment: Wound #24 Abdomen - midline Performed By: Physician Duanne Guess, MD Debridement Type: Debridement Level of Consciousness (Pre-procedure): Awake and Alert Pre-procedure Verification/Time Out Yes - 11:44 Taken: Start Time: 11:45 Pain Control: Lidocaine 4% T opical Solution Percent of Wound Bed Debrided: 100% T Area Debrided (cm): otal 1.96 Tissue and other material debrided: Viable, Non-Viable, Slough, Subcutaneous, Slough Level: Skin/Subcutaneous Tissue Debridement Description: Excisional Instrument: Curette Bleeding: Minimum Hemostasis Achieved: Pressure End Time:  11:46 Procedural Pain: 0 Post Procedural Pain: 0 Response to Treatment: Procedure was tolerated well Level of Consciousness (Post- Awake and Alert procedure): Post Debridement Measurements of Total Wound Length: (cm) 1 Width: (cm) 2.5 Depth: (cm) 0.1 Volume: (cm) 0.196 Character of Wound/Ulcer Post Debridement: Stable Post Procedure Diagnosis Same as Pre-procedure Notes Scribed for Dr Lady Gary by Brenton Grills RN. Electronic Signature(s) [name], [name] ([id number]) [id number] Page 2 of 16 Signed: 01/10/2023 12:16:21 PM By: Duanne Guess MD FACS Signed: 02/20/2023 7:49:05 AM By: Brenton Grills Entered By: Brenton Grills on 01/10/2023 11:45:38 -------------------------------------------------------------------------------- Debridement Details Patient Name: Date of Service: [name], [name] 01/10/2023 10:45 A [name] Medical Record Number: [id number] Patient Account Number: [id number] Date of Birth/Sex: Treating RN: [birth year] (62 y.o. Gevena Mart Primary Care Provider: [name] Other Clinician: Referring Provider: Treating Provider/Extender: Vivien Rossetti in Treatment: 25 Debridement Performed for Assessment: Wound #23 Midline Umbilicus Performed By: Physician Duanne Guess, MD Debridement Type: Debridement Level of Consciousness (Pre-procedure): Awake and Alert Pre-procedure Verification/Time Out Yes - 11:44 Taken: Start Time: 11:45 Pain Control: Lidocaine 4% T opical Solution Percent of Wound Bed Debrided: 100% T Area Debrided (cm): otal 21.98 Tissue and other material debrided: Viable, Non-Viable, Eschar, Slough, Subcutaneous, Slough Level: Skin/Subcutaneous Tissue Debridement Description: Excisional Instrument: Curette Bleeding: Minimum Hemostasis Achieved: Pressure End Time: 11:46 Procedural Pain: 0 Post Procedural Pain: 0 Response to Treatment: Procedure was tolerated well Level of  Consciousness (Post- Awake and Alert procedure): Post Debridement Measurements of Total Wound Length: (cm) 7 Width: (cm) 4 Depth: (cm) 0.1 Volume: (cm) 2.199 Character of Wound/Ulcer Post Debridement: Stable Post Procedure Diagnosis Same as Pre-procedure Notes Scribed for Dr Lady Gary by Brenton Grills RN. Electronic Signature(s) Signed: 01/10/2023 12:16:21 PM By: Duanne Guess MD FACS Signed: 02/20/2023 7:49:05 AM By: Brenton Grills Entered By: Brenton Grills on 01/10/2023 11:47:11 -------------------------------------------------------------------------------- Debridement Details Patient Name: Date of Service: [name], [name] 01/10/2023 10:45 A [name] Medical Record Number: [id number] Patient Account Number: [id number] Date of Birth/Sex: Treating RN: [birth year] (62 y.o. Gevena Mart Primary Care Provider: [name] Other  Clinician: Referring Provider: Treating Provider/Extender: Vivien Rossetti in Treatment: 25 Debridement Performed for Assessment: Wound #19 Left,Plantar Foot Performed By: Physician Duanne Guess, MD Debridement Type: Debridement Severity of Tissue Pre Debridement: Fat layer exposed Level of Consciousness (Pre-procedure): Awake and Alert Pre-procedure Verification/Time Out Yes - 11:44 Taken: Start Time: 11:45 [name], [name] ([id number]) [id number] Page 3 of 16 Pain Control: Lidocaine 4% T opical Solution Percent of Wound Bed Debrided: 100% T Area Debrided (cm): otal 10.99 Tissue and other material debrided: Viable, Non-Viable, Biofilm Level: Non-Viable Tissue Debridement Description: Selective/Open Wound Instrument: Curette Bleeding: Minimum Hemostasis Achieved: Pressure End Time: 11:46 Procedural Pain: 0 Post Procedural Pain: 0 Response to Treatment: Procedure was tolerated well Level of Consciousness (Post- Awake and Alert procedure): Post Debridement Measurements of Total  Wound Length: (cm) 7 Width: (cm) 2 Depth: (cm) 1 Volume: (cm) 10.996 Character of Wound/Ulcer Post Debridement: Stable Severity of Tissue Post Debridement: Fat layer exposed Post Procedure Diagnosis Same as Pre-procedure Notes Scribed for Dr Lady Gary by Brenton Grills RN. Electronic Signature(s) Signed: 01/10/2023 12:16:21 PM By: Duanne Guess MD FACS Signed: 02/20/2023 7:49:05 AM By: Brenton Grills Entered By: Brenton Grills on 01/10/2023 11:48:16 -------------------------------------------------------------------------------- HPI Details Patient Name: Date of Service: [name], [name] 01/10/2023 10:45 A [name] Medical Record Number: [id number] Patient Account Number: [id number] Date of Birth/Sex: Treating RN: 15-Mar-1961 (62 y.o. F) Primary Care Provider: [name] Other Clinician: Referring Provider: Treating Provider/Extender: Vivien Rossetti in Treatment: 25 History of Present Illness HPI Description: 07/23/2019 on evaluation today patient presents with a myriad of wounds noted at multiple locations over her right heel, right lower extremity, and abdominal region. She also has bilateral lower extremity lymphedema which is quite significant as well. Incidentally she also has congestive heart failure and hypertension. She also is obese. With that being said I think all this is contributing as well to her lower extremity edema which is very much uncontrolled. It seems like its been at least several months since she is worn any compression according to what she tells me. With that being said I am not sure exactly when that would have been. She does have fibrotic changes in the lower extremity secondary to lymphedema worse on the left than the right. She did show me pictures of wound she had on the anterior portion of her shin which was quite significant fortunately that has healed. These issues have been intermittent at this time. Again I think that with  appropriate compression therapy she may actually be doing better than what we are seeing at this point but again I am not really sure that she is ever been extremely compliant with that. Fortunately there is no signs of active infection at this time. No fever chills noted. As far as the abdominal ulcer she initially had one wound that she thinks may have been a bug bite. Subsequently she was put a dressing on this and she states that the tape pulled skin off on other locations causing other wounds that have not healed that is been about 3 months. The wounds on her lower extremities have been intermittent over 3 years. This includes the heel. 11/19; this is a patient that I have not seen previously. She was admitted to our clinic last week with multiple wounds including several superficial circular areas on her abdomen predominantly right upper quadrant. Also 1 in her umbilicus. She has an area on her right lateral malleolus which was new today. She has bilateral lower extremity  edema with very significant stasis dermatitis on the left anterior tibial area. She has tightly adherent skin in her lower extremities probably secondary to cutaneous fibrosis in the area. We put her in compression last week. She comes in with the dressings reasonably saturated. 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 She has multiple areas on her arms from scratching. 12/3; the patient I saw for the first time 2 weeks ago. She has 3 small circular areas on her abdomen 2 in the right upper quadrant one on her umbilicus. We have been using silver alginate to this area. She also had an area on her right lateral malleolus and right heel and right medial malleolus. We have been using silver alginate here under compression. She does not  have an open area on the left leg today. We are going to order her stockings for the left leg. She clearly has chronic lymphedema in these areas. X-ray of the foot from 10/20 did not show any fracture or dislocation. The heel wound was noted there was no evidence of osteomyelitis She complains of generalized pruritus. 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 She has longstanding lymphedema. Although she does not have known liver disease I looked at his CT scan of the abdomen from February of this year that showed hepatic steatosis 12/11; patient still has no open area on the left leg we transitioned her into a stocking she had. Her stockings from [employer] are supposed to be arriving later today which will be the stockings of choice. The only wound remaining on the right is the right heel 2 small superficial open areas. Everything else is well on its way to healing in the right leg few small excoriations. She has 1 major area remaining on her abdomen the rest seem to be healing [name], [name] ([id number]) [id number] Page 4 of 16 12/22 patient still has no open area on the left leg and she is still in a stocking. She had still 2 open areas on the tip of her right heel. These look like pressure related areas although the patient is not really certain how this is happening. She has an open heeled shoe that we provided. Still has 1 open area on the right lateral abdomen The patient has complaints of generalized pruritus. She has multiple excoriated areas on her abdomen that of close over her arms or thighs which I think are from scratching. She tells me that she has never had a basic work-up for this which might include basic lab work, liver functions, kidney functions, thyroid etc. She might also benefit from a dermatologist 12/29; the patient has a deeper larger more painful wound on the tip of her right heel. Again these look like  pressure ulcers although she wears an open toed heel pads or heel at night to prevent it from hitting the mattress etc. They tell me and remind me that this is been present on and off for about 2 years that has closed over but then will reopen. I did look over her lab work that was available in [facility] health link. She does not have elevated liver function tests or creatinine. I was not able to see a TSH. This was in response to her complaints of generalized itching and a itch/scratch cycle. I also noted that she is on  OxyContin and oxycodone and of course narcotics can cause generalized pruritus as a side effect Readmission: History From [place], [place] Last Week: 03/29/2021 this is a patient who presents for initial evaluation here in the clinic though I have seen her 1 time previously in 2020 this was in [place]. That was actually her initial visit therefore a heel ulceration. Subsequently that did not healing she actually saw me the 1 time in November and then subsequently saw Dr. Leanord Hawking through the end of December where she apparently was healed. 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 This is on the right. With that being said right now she has a basically region on the lateral portion of the ray site where she is applying more pressure especially as her foot seems to be starting to turn in and this has become an increasingly significant issue for her to be honest. She does see Dr. Lajoyce Corners and Dr. Lajoyce Corners has had her on doxycycline for quite a bit of time here. Subsequently she is just now wrapping that out. I think that she probably would be benefited by continue the doxycycline for a time while we can work through what we need to do with things here. She does have evidence of osteomyelitis based on what I saw on the MRI today. This obviously is unfortunate and definitely not something  that she was hoping to hear. With that being said I do believe that she would potentially be a strong candidate for hyperbaric oxygen therapy. Also believe she could be a candidate for total contact cast though we have to be cautious due to the fact that how her foot is bending inward I think that this is good to be a little bit of a concern. We will definitely have to keeping a close eye on things. She does have a history of diabetes mellitus type 2. She also other than the amputation has a medical history positive for hypertension. She has never undergone hyperbaric oxygen therapy previously I did show her the chamber we did talk a little bit about it today as well. Admission T [facility]: o 04/06/2021 Patient presents here in [place] today for evaluation. Again she is establishing care here after I saw her last week in [place]. Obviously I think that she is doing extremely well at this point which is great news and in general I am extremely pleased with where things stand from the standpoint of the appearance of the wound. Obviously this is not significantly smaller but does look a lot cleaner I think using the Hydrofera Blue has been of benefit over the past week. Nonetheless she still has a wound with issues with pressure here which I think is good to be an ongoing issue. Also think that the osteomyelitis which is chronic is also getting ongoing issue. She does want to try to do what she can to prevent this from worsening and ending with a below-knee amputation. T that end I do think that getting her into the hyperbaric oxygen chamber would be what we need to do. She has recently been seen by cardiology o and subsequently well as far as that is concerned. Her ejection fraction was appropriate for hyperbarics and everything seems to be doing great in that regard. She has not had a recent chest x-ray she is a former smoker around 15 years ago she quit. Nonetheless I do  believe with her  asthma we do want to do a chest x-ray just to make sure everything is okay before proceeding with the hyperbarics although we can go ahead and see about getting the approval. I also think she is going require some sharp debridement and around this wound we also have to contact her insurance for prior approval on this as well. 04/13/2021 upon evaluation today patient appears to be doing a little bit better in regard to her leg in fact the swelling is dramatically better. Very pleased with where things stand in that regard. Fortunately there does not appear to be any signs of active infection at this time. No fevers, chills, nausea, vomiting, or diarrhea. 04/20/2021 upon evaluation today patient appears to be doing decently well in regard to her foot. There is some need for sharp debridement here today. With that being said we will get a go ahead and proceed with that today as the foot is becoming somewhat macerated with the overhanging callus that is definitely not we want to see. With that being said the patient does have an issue here as well with a boil in the perineal region unfortunately that is an issue for her today as well. I did have a look at that as well. We are still working on dealing with insurance as far as getting hyperbarics approved. 04/27/2021 upon evaluation today patient appears to be doing somewhat poorly in general compared to where she has been. At this point she is having a lot of swelling which is the main concerning thing that I am seeing. There does not appear to be any signs of infection currently which is good news but at the same time I do feel like that she is a lot more swollen than she was even last week and is showing how much she is weeping in regard to the right lower leg. She also has a blister on the left breast although is not an open wound at this time. She continues to have the area in the perineum as well. 05/04/2021 upon evaluation today patient appears to actually  be doing decently well in regard to her wound on the foot. Fortunately there is no signs of active infection at this time. No fevers, chills, nausea, vomiting, or diarrhea. Unfortunately she does have an area on the left breast which is actually appearing to be a burn based on what I see physically. She does note that she uses rice bags for areas in general and may have left one in that area not realizing it was burning her as she does not really have much feeling. I think this is very probable based on what I am seeing. For that reason working to treat this as such I do think we need to loosen up a lot of the necrotic tissue here. I Am going tosend in a prescription for Santyl for the patient. 9/1; patient presents for HBO today but cannot do treatment due to wheezing and feeling short of breath when laying down. She was set up as a doctor visit today since she was not able to do HBO. She states she feels okay overall and started having a dry cough over the past couple days. She has not tested herself for COVID. She denies fever/chills or sputum production. She thinks her symptoms are related to allergies. She has been using silver alginate to her right plantar foot wound. She has not been using Santyl to the breast wound because she reports forgetting to  do this. She uses zinc oxide to her perennial wound. She currently denies systemic signs of infection. 9/8; patient presents for follow-up. She has been a unable to do HBO treatments for the past week due to her back pain. She is currently taking Flexeril to address this issue. She reports no issues with her compression wrap. She has been putting Santyl on the left breast wound and Monistat cream to the perennial wound. She denies signs of infection. 9/15; patient presents for follow-up. Again she is unable to do HBO treatment today due to sinus pain. She has 2 new wounds to her right foot which she is unaware of. She has been putting Santyl on the  left breast wound and zinc oxide to the perineal wound. She currently denies signs of infection. Readmission 10/17/2021 Ms. Tracina Pinet is a 62 year old female with a past medical history of type 2 diabetes, osteomyelitis status post right BKA and morbid obesity that presents to the clinic for multiple scattered open wounds to her body. These are located to the abdomen, left posterior leg and sacral region. She has been using mupirocin ointment, Neosporin and zinc oxide to the wound beds. She has been started on Bactrim and Keflex by her primary care physician and states she has several days left to complete the course. She currently denies systemic signs of infection. READMISSION This is a 62 year old morbidly obese type II diabetic (poorly controlled with last hemoglobin A1c 9.2%). She has congestive heart failure, peripheral vascular disease, hypertension, coronary artery disease, venous stasis, connective tissue disease (o Sjogren's syndrome), and bilateral lower extremity edema. She has been seen here for various reasons in the past. She has a right above-knee amputation. She was recently hospitalized for a left diabetic foot ulcer. Orthopedic surgery was consulted. An MRI was performed that was not conclusive for osteomyelitis, but given the extent of the necrosis, orthopedics recommended amputation. Infectious disease was also consulted and have recommended a 6-week course of oral doxycycline and Augmentin. Apparently wound care was also consulted while she was in the hospital and simply recommended painting the area with Betadine. She saw infectious disease on October 27 with plans to continue doxycycline on Augmentin to continue therapy until November 14. The infectious disease provider also recommended that the patient Holley, Celisse [name] ([id number]) [id number] Page 5 of 16 reconsider amputation. Her PCP referred her to the wound care center, as the patient is very  reluctant to undergo amputation. She also has a small ulcer on her anterior tibial surface that recently opened after a minor trauma. On exam, her left foot is rolled such that the lateral aspect of her foot is the contact surface. There is a large ulcer with exposed necrotic muscle and fat. The wound does not probe to bone, but apparently there was bone exposure in the past while she was in the hospital. No malodor or purulent drainage. The wound on her anterior tibial surface is small with just a little slough accumulation. She has modest edema and skin changes consistent with stasis dermatitis. 07/28/2022: The anterior tibial wound is healed. The left plantar foot wound looks a little bit better today. There is significantly less necrotic tissue present. There is an area at the most caudal aspect of the wound that looks like there is ongoing pressure-induced tissue injury. Bone remains covered. 08/11/2022: The wound has deteriorated quite a bit since her last visit. Her husband reports that she has had significantly more drainage, such that he has had to change the dressing  multiple times per day. During the course of the visit, she recalled that she had not been taking her Lasix for at least 4 days. There is substantial periwound maceration and further tissue breakdown. There is necrotic tissue at the midportion of the wound and tendon is now exposed underneath this necrotic muscle. 08/17/2022: The wound looks better this week. There is still an area of necrotic muscle and tendon in the midfoot, but the forefoot has improved with some epithelium beginning to come in from the margins. Unfortunately, there is evidence of ongoing pressure-induced tissue injury near the heel. The patient does admit to resting her heel on a stool frequently. She is still taking Bactrim as prescribed and her Keystone topical antibiotic compound should arrive at their home today. 12/14; fairly large sized wound on the left  plantar foot. She has a right AKA. She uses a leg only for transfers she says she does not propel her wheelchair with that foot. She has been using Keystone, silver alginate and an Ace wrap. She has completed her Bactrim and Augmentin. She is a type II diabetic. The most worrisome part of this wound is a smaller area roughly 2 x 2 in the distal part of the wound which is 100% occupied by necrotic tendon 09/07/2022: She had an extensive debridement at her last visit. The wound is smaller today and all of the tissues appear healthier. 09/15/2022: The wound measurements are about the same, but overall the surface appears healthier. There is still a little bit of the nonviable tendon exposed at the midfoot. 10/13/2022: The patient has been ill and unable to attend clinic for almost a month. Today, the wound measures smaller and there are buds of epithelium beginning to emerge, particularly at the calcaneus. There is a bridge of skin trying to come across the midportion of the wound, as well. Minimal slough accumulation. 10/26/2022: The absolute wound measurements have not changed, but the surface is technically smaller due to a bridge of epithelium that nearly divides the wound in two. There is some eschar at the medial edge of the calcaneal aspect of the wound, underneath which there is good epithelium. There is some slough on the wound surface, predominantly at the heel. 11/14/2022: She has been absent from clinic for couple of weeks due to health issues. There is more epithelium encroaching upon the edges of the wound. She has built up some eschar around the edges. There is thin biofilm and slough on the wound surface. 12/12/2022: The wound is narrower this week. There is some eschar and macerated callus around the edges with biofilm on the surface. There is a bridge of skin attempting to divide the wound into 2 sections. 12/26/2022: The wound on her plantar left foot is smaller and the bridge of skin between  the 2 sections is getting wider. There is a little bit of eschar around the edges and thin slough on the surface. Unfortunately, she has multiple additional wounds that have actually been present for some time but she only just now is bringing them to our attention. She has wounds on her pubis and on the distal portion of her AKA stump as well as a wound on her mid abdomen. The pubis and AKA stump wounds look as though they are secondary to friction or pressure. The wound in her mid abdomen is of unclear etiology. It is quite dry. She has moisture-related tissue breakdown in her intertriginous folds and there is a strong smell of yeast coming from her  groin region. 01/10/2023: She feels poorly today and was unable to transfer from her wheelchair to the exam table. The only wounds we were able to assess today were those on her upper abdomen, the one at her umbilicus, and the plantar left foot ulcer. The abdominal sites show evidence of picking and scratching. There is slough and eschar on all of the surfaces. The plantar ulcer on the left has some biofilm on the surface, but it continues to contract and epithelial bridge is larger. Electronic Signature(s) Signed: 01/10/2023 12:10:37 PM By: Duanne Guess MD FACS Entered By: Duanne Guess on 01/10/2023 12:10:37 -------------------------------------------------------------------------------- Physical Exam Details Patient Name: Date of Service: [name], [name] 01/10/2023 10:45 A [name] Medical Record Number: [id number] Patient Account Number: [id number] Date of Birth/Sex: Treating RN: 05/11/1961 (62 y.o. F) Primary Care Provider: [name] Other Clinician: Referring Provider: Treating Provider/Extender: Vivien Rossetti in Treatment: 25 Constitutional . . . . no acute distress. Respiratory Normal work of breathing on room air. Notes 51/2024: The only wounds we were able to assess today were those on her upper  abdomen, the one at her umbilicus, and the plantar left foot ulcer. The abdominal sites show evidence of picking and scratching. There is slough and eschar on all of the surfaces. The plantar ulcer on the left has some biofilm on the surface, but it continues to contract and epithelial bridge is larger. Electronic Signature(s) Signed: 01/10/2023 12:11:59 PM By: Duanne Guess MD FACS Entered By: Duanne Guess on 01/10/2023 12:11:59 [name], [name] ([id number]) [id number] Page 6 of 16 -------------------------------------------------------------------------------- Physician Orders Details Patient Name: Date of Service: [name], [name] 01/10/2023 10:45 A [name] Medical Record Number: [id number] Patient Account Number: [id number] Date of Birth/Sex: Treating RN: 1961-02-16 (62 y.o. 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 L30.4 Erythema intertrigo L97.112 Non-pressure chronic ulcer of right thigh with fat layer exposed L98.492 Non-pressure chronic ulcer of skin of other sites with fat layer exposed L97.822 Non-pressure chronic ulcer of other part of left lower leg with fat layer exposed M86.9 Osteomyelitis, unspecified E11.621 Type 2 diabetes mellitus with foot ulcer E66.01 Morbid (severe) obesity due to excess calories M35.00 Sjogren syndrome, unspecified I73.9 Peripheral vascular disease, unspecified I50.32 Chronic diastolic (congestive) heart failure 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 - **** EXTRA TIME at least 45 mins COMPLEX patient**** Return A Dr. Lady Gary Room 3 Other: - over the counter antifungal  spray Anesthetic (In clinic) Topical Lidocaine 5% applied to wound bed Bathing/ Shower/ Hygiene May shower and wash wound with soap and water. - with dressing changes Edema Control - Lymphedema / SCD / Other Elevate legs to the level of the heart or above for 30 minutes daily and/or when sitting for 3-4 times a day throughout the day. Avoid standing for long periods of time. Patient to wear own compression stockings every day. Off-Loading Other: - Try and elevate Left leg throughout the day Wound Treatment Wound #19 - Foot Wound Laterality: Plantar, Left Cleanser: Soap and Water 1 x Per Day/30 Days Discharge Instructions: May shower and wash wound with dial antibacterial soap and water prior to dressing change. Cleanser: Wound Cleanser (Generic) 1 x Per Day/30 Days Discharge Instructions: Cleanse the wound with wound  cleanser prior to applying a clean dressing using gauze sponges, not tissue or cotton balls. Prim Dressing: Maxorb Extra Ag+ Alginate Dressing, 4x4.75 (in/in) (Generic) 1 x Per Day/30 Days ary Discharge Instructions: Apply to wound bed as instructed Secondary Dressing: ABD Pad, 5x9 (Generic) 1 x Per Day/30 Days Discharge Instructions: Apply over primary dressing as directed. Secondary Dressing: Woven Gauze Sponge, Non-Sterile 4x4 in (Generic) 1 x Per Day/30 Days Discharge Instructions: Apply over primary dressing as directed. Secured With: Elastic Bandage 4 inch (ACE bandage) (Generic) 1 x Per Day/30 Days Discharge Instructions: Secure with ACE bandage as directed. Secured With: [employer], 4.5x3.1 (in/yd) (Generic) 1 x Per Day/30 Days Discharge Instructions: Secure with Kerlix as directed. Secured With: 34M Medipore Scientist, research (life sciences) Surgical T 2x10 (in/yd) (Generic) 1 x Per Day/30 Days ape Discharge Instructions: Secure with tape as directed. Bahena, Sandee [name] ([id number]) [id number] Page 7 of 16 Wound #23 - Umbilicus Wound Laterality:  Midline Prim Dressing: MediHoney Gel, tube 1.5 (oz) 1 x Per Day/30 Days ary Discharge Instructions: Apply to wound bed as instructed Secondary Dressing: ABD Pad, 8x10 1 x Per Day/30 Days Discharge Instructions: Apply over primary dressing as directed. Secured With: 34M Medipore H Soft Cloth Surgical T ape, 4 x 10 (in/yd) 1 x Per Day/30 Days Discharge Instructions: Secure with tape as directed. Wound #24 - Abdomen - midline Topical: Ketoconazole Cream 2% 1 x Per Day/30 Days Discharge Instructions: Apply Ketoconazole as directed Topical: Interdry 1 x Per Day/30 Days Discharge Instructions: Apply to under [place] Electronic Signature(s) Signed: 01/10/2023 12:16:21 PM By: Duanne Guess MD FACS Entered By: Duanne Guess on 01/10/2023 12:12:45 -------------------------------------------------------------------------------- Problem List Details Patient Name: Date of Service: [name], [name] 01/10/2023 10:45 A [name] Medical Record Number: [id number] Patient Account Number: [id number] Date of Birth/Sex: Treating RN: 12-16-1960 (62 y.o. 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 07/19/2022 No Yes L30.4 Erythema intertrigo 12/26/2022 No Yes L97.112 Non-pressure chronic ulcer of right thigh with fat layer exposed 12/26/2022 No Yes L98.492 Non-pressure chronic ulcer of skin of other sites with fat layer exposed 12/26/2022 No Yes L97.822 Non-pressure chronic ulcer of other part of left lower leg with fat layer exposed4/16/2024 No Yes M86.9 Osteomyelitis, unspecified 07/19/2022 No Yes E11.621 Type 2 diabetes mellitus with foot ulcer 07/19/2022 No Yes E66.01 Morbid (severe) obesity due to excess calories 07/19/2022 No  Yes M35.00 Sjogren syndrome, unspecified 07/19/2022 No Yes Reine, Galena [name] ([id number]) [id number] Page 8 of 16 I73.9 Peripheral vascular disease, unspecified 07/19/2022 No Yes I50.32 Chronic diastolic (congestive) heart failure 07/19/2022 No Yes 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 01/10/2023 10:45 A [name] Medical Record Number: [id number] Patient Account Number: [id number] Date of Birth/Sex: Treating RN: 1961/05/10 (62 y.o. F) Primary Care Provider: [name] Other Clinician: Referring Provider: Treating Provider/Extender: Vivien Rossetti in Treatment: 25 Subjective Chief Complaint Information obtained from Patient 10/17/2021; multiple scattered wounds to the abdomen, posterior left leg, left labia and sacrum 07/19/2022: left foot DFU History of Present Illness (HPI) 07/23/2019 on evaluation today patient presents with a myriad of  wounds noted at multiple locations over her right heel, right lower extremity, and abdominal region. She also has bilateral lower extremity lymphedema which is quite significant as well. Incidentally she also has congestive heart failure and hypertension. She also is obese. With that being said I think all this is contributing as well to her lower extremity edema which is very much uncontrolled. It seems like its been at least several months since she is worn any compression according to what she tells me. With that being said I am not sure exactly when that would have been. She does have fibrotic changes in the lower extremity secondary to lymphedema worse on  the left than the right. She did show me pictures of wound she had on the anterior portion of her shin which was quite significant fortunately that has healed. These issues have been intermittent at this time. Again I think that with appropriate compression therapy she may actually be doing better than what we are seeing at this point but again I am not really sure that she is ever been extremely compliant with that. Fortunately there is no signs of active infection at this time. No fever chills noted. As far as the abdominal ulcer she initially had one wound that she thinks may have been a bug bite. Subsequently she was put a dressing on this and she states that the tape pulled skin off on other locations causing other wounds that have not healed that is been about 3 months. The wounds on her lower extremities have been intermittent over 3 years. This includes the heel. 11/19; this is a patient that I have not seen previously. She was admitted to our clinic last week with multiple wounds including several superficial circular areas on her abdomen predominantly right upper quadrant. Also 1 in her umbilicus. She has an area on her right lateral malleolus which was new today. She has bilateral lower extremity edema with very significant stasis dermatitis on the left anterior tibial area. She has tightly adherent skin in her lower extremities probably secondary to cutaneous fibrosis in the area. We put her in compression last week. She comes in with the dressings reasonably saturated. 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 She has multiple areas on her arms from scratching. 12/3; the patient I saw for the first time 2 weeks ago. She has 3 small circular areas on her abdomen 2 in the right upper quadrant one  on her umbilicus. We have been using silver alginate to this area. She also had an area on her right lateral malleolus and right heel and right medial malleolus. We have been using silver alginate here under compression. She does not have an open area on the left leg today. We are going to order her stockings for the left leg. She clearly has chronic lymphedema in these areas. X-ray of the foot from 10/20 did not show any fracture or dislocation. The heel wound was noted there was no evidence of osteomyelitis She complains of generalized pruritus. 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 She has longstanding lymphedema. Although she does not have known liver disease I looked at his CT scan of the abdomen from February of this year that showed hepatic steatosis 12/11; patient still has no open area on the left leg we transitioned her into  a stocking she had. Her stockings from [employer] are supposed to be arriving later today which will be the stockings of choice. The only wound remaining on the right is the right heel 2 small superficial open areas. Everything else is well on its way to healing in the right leg few small excoriations. She has 1 major area remaining on her abdomen the rest seem to be healing 12/22 patient still has no open area on the left leg and she is still in a stocking. She had still 2 open areas on the tip of her right heel. These look like pressure related areas although the patient is not really certain how this is happening. She has an open heeled shoe that we provided. Still has 1 open area on the right lateral abdomen The patient has complaints of generalized pruritus. She has multiple excoriated areas on her abdomen that of close over her arms or thighs which I think are from scratching. She tells me that she has never had a basic work-up for this which might include basic lab work, liver functions, kidney functions, thyroid etc. She  might also benefit from a dermatologist 12/29; the patient has a deeper larger more painful wound on the tip of her right heel. Again these look like pressure ulcers although she wears an open toed heel pads or heel at night to prevent it from hitting the mattress etc. They tell me and remind me that this is been present on and off for about 2 years that Vanzanten, Shela [name] ([id number]) [id number].pdf Page 9 of 16 has closed over but then will reopen. I did look over her lab work that was available in [facility] link. She does not have elevated liver function tests or creatinine. I was not able to see a TSH. This was in response to her complaints of generalized itching and a itch/scratch cycle. I also noted that she is on OxyContin and oxycodone and of course narcotics can cause generalized pruritus as a side effect Readmission: History From [place], [place] Last Week: 03/29/2021 this is a patient who presents for initial evaluation here in the clinic though I have seen her 1 time previously in 2020 this was in [place]. That was actually her initial visit therefore a heel ulceration. Subsequently that did not healing she actually saw me the 1 time in November and then subsequently saw Dr. Leanord Hawking through the end of December where she apparently was healed. 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 This is on the right. With that being said right now she has a basically region on the lateral portion of the ray site where she is applying more pressure especially as her foot seems to be starting to turn in and this has become an increasingly significant issue for her to be honest. She does see Dr. Lajoyce Corners and Dr. Lajoyce Corners has had her on doxycycline for quite a bit of time here. Subsequently she is just now wrapping that out. I think that she probably would be benefited by continue  the doxycycline for a time while we can work through what we need to do with things here. She does have evidence of osteomyelitis based on what I saw on the MRI today. This obviously is unfortunate and definitely not something that she was hoping to hear. With that being said I do believe that she would potentially be a  strong candidate for hyperbaric oxygen therapy. Also believe she could be a candidate for total contact cast though we have to be cautious due to the fact that how her foot is bending inward I think that this is good to be a little bit of a concern. We will definitely have to keeping a close eye on things. She does have a history of diabetes mellitus type 2. She also other than the amputation has a medical history positive for hypertension. She has never undergone hyperbaric oxygen therapy previously I did show her the chamber we did talk a little bit about it today as well. Admission T [facility]: o 04/06/2021 Patient presents here in [place] today for evaluation. Again she is establishing care here after I saw her last week in [place]. Obviously I think that she is doing extremely well at this point which is great news and in general I am extremely pleased with where things stand from the standpoint of the appearance of the wound. Obviously this is not significantly smaller but does look a lot cleaner I think using the Hydrofera Blue has been of benefit over the past week. Nonetheless she still has a wound with issues with pressure here which I think is good to be an ongoing issue. Also think that the osteomyelitis which is chronic is also getting ongoing issue. She does want to try to do what she can to prevent this from worsening and ending with a below-knee amputation. T that end I do think that getting her into the hyperbaric oxygen chamber would be what we need to do. She has recently been seen by cardiology o and subsequently well as far as that is concerned. Her  ejection fraction was appropriate for hyperbarics and everything seems to be doing great in that regard. She has not had a recent chest x-ray she is a former smoker around 15 years ago she quit. Nonetheless I do believe with her asthma we do want to do a chest x-ray just to make sure everything is okay before proceeding with the hyperbarics although we can go ahead and see about getting the approval. I also think she is going require some sharp debridement and around this wound we also have to contact her insurance for prior approval on this as well. 04/13/2021 upon evaluation today patient appears to be doing a little bit better in regard to her leg in fact the swelling is dramatically better. Very pleased with where things stand in that regard. Fortunately there does not appear to be any signs of active infection at this time. No fevers, chills, nausea, vomiting, or diarrhea. 04/20/2021 upon evaluation today patient appears to be doing decently well in regard to her foot. There is some need for sharp debridement here today. With that being said we will get a go ahead and proceed with that today as the foot is becoming somewhat macerated with the overhanging callus that is definitely not we want to see. With that being said the patient does have an issue here as well with a boil in the perineal region unfortunately that is an issue for her today as well. I did have a look at that as well. We are still working on dealing with insurance as far as getting hyperbarics approved. 04/27/2021 upon evaluation today patient appears to be doing somewhat poorly in general compared to where she has been. At this point she is having a lot of swelling which is the main concerning thing that I am seeing.  There does not appear to be any signs of infection currently which is good news but at the same time I do feel like that she is a lot more swollen than she was even last week and is showing how much she is weeping in  regard to the right lower leg. She also has a blister on the left breast although is not an open wound at this time. She continues to have the area in the perineum as well. 05/04/2021 upon evaluation today patient appears to actually be doing decently well in regard to her wound on the foot. Fortunately there is no signs of active infection at this time. No fevers, chills, nausea, vomiting, or diarrhea. Unfortunately she does have an area on the left breast which is actually appearing to be a burn based on what I see physically. She does note that she uses rice bags for areas in general and may have left one in that area not realizing it was burning her as she does not really have much feeling. I think this is very probable based on what I am seeing. For that reason working to treat this as such I do think we need to loosen up a lot of the necrotic tissue here. I Am going tosend in a prescription for Santyl for the patient. 9/1; patient presents for HBO today but cannot do treatment due to wheezing and feeling short of breath when laying down. She was set up as a doctor visit today since she was not able to do HBO. She states she feels okay overall and started having a dry cough over the past couple days. She has not tested herself for COVID. She denies fever/chills or sputum production. She thinks her symptoms are related to allergies. She has been using silver alginate to her right plantar foot wound. She has not been using Santyl to the breast wound because she reports forgetting to do this. She uses zinc oxide to her perennial wound. She currently denies systemic signs of infection. 9/8; patient presents for follow-up. She has been a unable to do HBO treatments for the past week due to her back pain. She is currently taking Flexeril to address this issue. She reports no issues with her compression wrap. She has been putting Santyl on the left breast wound and Monistat cream to the perennial wound.  She denies signs of infection. 9/15; patient presents for follow-up. Again she is unable to do HBO treatment today due to sinus pain. She has 2 new wounds to her right foot which she is unaware of. She has been putting Santyl on the left breast wound and zinc oxide to the perineal wound. She currently denies signs of infection. Readmission 10/17/2021 Ms. Meika Macaraeg is a 62 year old female with a past medical history of type 2 diabetes, osteomyelitis status post right BKA and morbid obesity that presents to the clinic for multiple scattered open wounds to her body. These are located to the abdomen, left posterior leg and sacral region. She has been using mupirocin ointment, Neosporin and zinc oxide to the wound beds. She has been started on Bactrim and Keflex by her primary care physician and states she has several days left to complete the course. She currently denies systemic signs of infection. READMISSION This is a 62 year old morbidly obese type II diabetic (poorly controlled with last hemoglobin A1c 9.2%). She has congestive heart failure, peripheral vascular disease, hypertension, coronary artery disease, venous stasis, connective tissue disease (o Sjogren's syndrome), and bilateral lower  extremity edema. She has been seen here for various reasons in the past. She has a right above-knee amputation. She was recently hospitalized for a left diabetic foot ulcer. Orthopedic surgery was consulted. An MRI was performed that was not conclusive for osteomyelitis, but given the extent of the necrosis, orthopedics recommended amputation. Infectious disease was also consulted and have recommended a 6-week course of oral doxycycline and Augmentin. Apparently wound care was also consulted while she was in the hospital and simply recommended painting the area with Betadine. She saw infectious disease on October 27 with plans to continue doxycycline on Augmentin to continue therapy until November 14. The  infectious disease provider also recommended that the patient reconsider amputation. Her PCP referred her to the wound care center, as the patient is very reluctant to undergo amputation. She also has a small ulcer on her anterior tibial surface that recently opened after a minor trauma. On exam, her left foot is rolled such that the lateral aspect of her foot is the contact surface. There is a large ulcer with exposed necrotic muscle and fat. The wound does not probe to bone, but apparently there was bone exposure in the past while she was in the hospital. No malodor or purulent drainage. The wound on her anterior tibial surface is small with just a little slough accumulation. She has modest edema and skin changes consistent with stasis dermatitis. 07/28/2022: The anterior tibial wound is healed. The left plantar foot wound looks a little bit better today. There is significantly less necrotic tissue present. There is an area at the most caudal aspect of the wound that looks like there is ongoing pressure-induced tissue injury. Bone remains covered. Ronning, Mylah [name] ([id number]) [id number] Page 10 of 16 08/11/2022: The wound has deteriorated quite a bit since her last visit. Her husband reports that she has had significantly more drainage, such that he has had to change the dressing multiple times per day. During the course of the visit, she recalled that she had not been taking her Lasix for at least 4 days. There is substantial periwound maceration and further tissue breakdown. There is necrotic tissue at the midportion of the wound and tendon is now exposed underneath this necrotic muscle. 08/17/2022: The wound looks better this week. There is still an area of necrotic muscle and tendon in the midfoot, but the forefoot has improved with some epithelium beginning to come in from the margins. Unfortunately, there is evidence of ongoing pressure-induced tissue injury near the  heel. The patient does admit to resting her heel on a stool frequently. She is still taking Bactrim as prescribed and her Keystone topical antibiotic compound should arrive at their home today. 12/14; fairly large sized wound on the left plantar foot. She has a right AKA. She uses a leg only for transfers she says she does not propel her wheelchair with that foot. She has been using Keystone, silver alginate and an Ace wrap. She has completed her Bactrim and Augmentin. She is a type II diabetic. The most worrisome part of this wound is a smaller area roughly 2 x 2 in the distal part of the wound which is 100% occupied by necrotic tendon 09/07/2022: She had an extensive debridement at her last visit. The wound is smaller today and all of the tissues appear healthier. 09/15/2022: The wound measurements are about the same, but overall the surface appears healthier. There is still a little bit of the nonviable tendon exposed at the midfoot. 10/13/2022: The patient  has been ill and unable to attend clinic for almost a month. Today, the wound measures smaller and there are buds of epithelium beginning to emerge, particularly at the calcaneus. There is a bridge of skin trying to come across the midportion of the wound, as well. Minimal slough accumulation. 10/26/2022: The absolute wound measurements have not changed, but the surface is technically smaller due to a bridge of epithelium that nearly divides the wound in two. There is some eschar at the medial edge of the calcaneal aspect of the wound, underneath which there is good epithelium. There is some slough on the wound surface, predominantly at the heel. 11/14/2022: She has been absent from clinic for couple of weeks due to health issues. There is more epithelium encroaching upon the edges of the wound. She has built up some eschar around the edges. There is thin biofilm and slough on the wound surface. 12/12/2022: The wound is narrower this week. There is some  eschar and macerated callus around the edges with biofilm on the surface. There is a bridge of skin attempting to divide the wound into 2 sections. 12/26/2022: The wound on her plantar left foot is smaller and the bridge of skin between the 2 sections is getting wider. There is a little bit of eschar around the edges and thin slough on the surface. Unfortunately, she has multiple additional wounds that have actually been present for some time but she only just now is bringing them to our attention. She has wounds on her pubis and on the distal portion of her AKA stump as well as a wound on her mid abdomen. The pubis and AKA stump wounds look as though they are secondary to friction or pressure. The wound in her mid abdomen is of unclear etiology. It is quite dry. She has moisture-related tissue breakdown in her intertriginous folds and there is a strong smell of yeast coming from her groin region. 01/10/2023: She feels poorly today and was unable to transfer from her wheelchair to the exam table. The only wounds we were able to assess today were those on her upper abdomen, the one at her umbilicus, and the plantar left foot ulcer. The abdominal sites show evidence of picking and scratching. There is slough and eschar on all of the surfaces. The plantar ulcer on the left has some biofilm on the surface, but it continues to contract and epithelial bridge is larger. Patient History Information obtained from Patient. Family History Diabetes - Mother, Heart Disease - Mother,Father,Siblings, Hypertension - Mother,Father,Siblings, Lung Disease - Father, Stroke - Mother, No family history of Cancer, Hereditary Spherocytosis, Kidney Disease, Seizures, Thyroid Problems, Tuberculosis. Social History Former smoker - quit 15 years ago, Marital Status - Married, Alcohol Use - Never, Drug Use - No History, Caffeine Use - Rarely. Medical History Eyes Patient has history of Cataracts - both eyes Denies history of  Glaucoma, Optic Neuritis Ear/Nose/Mouth/Throat Denies history of Chronic sinus problems/congestion, Middle ear problems Hematologic/Lymphatic Patient has history of Lymphedema Denies history of Anemia, Hemophilia, Human Immunodeficiency Virus, Sickle Cell Disease Respiratory Patient has history of Asthma Denies history of Aspiration, Chronic Obstructive Pulmonary Disease (COPD), Pneumothorax, Sleep Apnea, Tuberculosis Cardiovascular Patient has history of Congestive Heart Failure, Coronary Artery Disease, Hypertension, Peripheral Arterial Disease, Peripheral Venous Disease Denies history of Angina, Arrhythmia, Hypotension, Myocardial Infarction, Phlebitis, Vasculitis Gastrointestinal Denies history of Hepatitis A, Hepatitis B, Hepatitis C Endocrine Patient has history of Type II Diabetes Denies history of Type I Diabetes Genitourinary Denies history of End  Stage Renal Disease Immunological Denies history of Lupus Erythematosus, Raynauds, Scleroderma Integumentary (Skin) Denies history of History of Burn Musculoskeletal Patient has history of Rheumatoid Arthritis Denies history of Gout, Osteoarthritis, Osteomyelitis Neurologic Patient has history of Neuropathy Denies history of Dementia, Quadriplegia, Paraplegia, Seizure Disorder Oncologic Denies history of Received Chemotherapy, Received Radiation Psychiatric Denies history of Anorexia/bulimia, Confinement Anxiety Hospitalization/Surgery History - 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 Lajoyce Corners 01/2020. 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 - 11/22 R AKA. Bower, Lavern [name] ([id number]) [id number] Page 11 of 16 Medical A Surgical History Notes nd Constitutional Symptoms (General Health) hypothyroidism Cardiovascular cardiac stents Gastrointestinal GERD Endocrine Hypothyroidism Immunological Mix connective tissue disease- autoimmune Sjogren's syndrome Musculoskeletal Fibromyalgia,  Scoliosis, Objective Constitutional no acute distress. Vitals Time Taken: 11:00 AM, Height: 62 in, Weight: 284 lbs, BMI: 51.9, Temperature: 98.5 F, Pulse: 90 bpm, Respiratory Rate: 18 breaths/min, Blood Pressure: 118/72 mmHg. General Notes: Forgot to take BS this morning Respiratory Normal work of breathing on room air. General Notes: 51/2024: The only wounds we were able to assess today were those on her upper abdomen, the one at her umbilicus, and the plantar left foot ulcer. The abdominal sites show evidence of picking and scratching. There is slough and eschar on all of the surfaces. The plantar ulcer on the left has some biofilm on the surface, but it continues to contract and epithelial bridge is larger. Integumentary (Hair, Skin) Wound #19 status is Open. Original cause of wound was Gradually Appeared. The date acquired was: 05/17/2022. The wound has been in treatment 25 weeks. The wound is located on the Left,Plantar Foot. The wound measures 7cm length x 2cm width x 0.1cm depth; 10.996cm^2 area and 1.1cm^3 volume. There is Fat Layer (Subcutaneous Tissue) exposed. There is no tunneling or undermining noted. There is a medium amount of serosanguineous drainage noted. The wound margin is distinct with the outline attached to the wound base. There is large (67-100%) pink granulation within the wound bed. There is a small (1-33%) amount of necrotic tissue within the wound bed including Adherent Slough. The periwound skin appearance had no abnormalities noted for moisture. The periwound skin appearance exhibited: Callus, Scarring. The periwound skin appearance did not exhibit: Crepitus, Excoriation, Induration, Rash, Atrophie Blanche, Cyanosis, Ecchymosis, Hemosiderin Staining, Mottled, Pallor, Rubor, Erythema. Periwound temperature was noted as No Abnormality. Wound #23 status is Open. Original cause of wound was Other Lesion. The date acquired was: 12/14/2022. The wound has been in treatment 2  weeks. The wound is located on the Midline Umbilicus. The wound measures 7cm length x 4cm width x 0.1cm depth; 21.991cm^2 area and 2.199cm^3 volume. There is Fat Layer (Subcutaneous Tissue) exposed. There is no tunneling or undermining noted. There is a medium amount of serosanguineous drainage noted. There is medium (34-66%) pink granulation within the wound bed. There is a medium (34-66%) amount of necrotic tissue within the wound bed. The periwound skin appearance had no abnormalities noted for texture. The periwound skin appearance had no abnormalities noted for moisture. The periwound skin appearance had no abnormalities noted for color. Periwound temperature was noted as No Abnormality. Wound #24 status is Open. Original cause of wound was Other Lesion. The date acquired was: 12/14/2022. The wound has been in treatment 2 weeks. The wound is located on the Abdomen - midline. The wound measures 1cm length x 2.5cm width x 0.1cm depth; 1.963cm^2 area and 0.196cm^3 volume. There is Fat Layer (Subcutaneous Tissue) exposed. There is no tunneling or undermining noted.  There is a medium amount of serosanguineous drainage noted. There is large (67-100%) red granulation within the wound bed. There is a small (1-33%) amount of necrotic tissue within the wound bed including Adherent Slough. The periwound skin appearance had no abnormalities noted for texture. The periwound skin appearance had no abnormalities noted for moisture. The periwound skin appearance had no abnormalities noted for color. Periwound temperature was noted as No Abnormality. 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 Erythema intertrigo Non-pressure chronic ulcer of right thigh with fat layer exposed Non-pressure chronic ulcer of skin of other sites with fat layer exposed Non-pressure chronic ulcer of other part of left lower leg with fat layer exposed Osteomyelitis,  unspecified Type 2 diabetes mellitus with foot ulcer Morbid (severe) obesity due to excess calories Sjogren syndrome, unspecified Peripheral vascular disease, unspecified Chronic diastolic (congestive) heart failure Acquired absence of right leg above knee Dagostino, Ena [name] ([id number]) [id number] Page 12 of 16 Procedures Wound #19 Pre-procedure diagnosis of Wound #19 is a Diabetic Wound/Ulcer of the Lower Extremity located on the Left,Plantar Foot .Severity of Tissue Pre Debridement is: Fat layer exposed. There was a Selective/Open Wound Non-Viable Tissue Debridement with a total area of 10.99 sq cm performed by Duanne Guess, MD. With the following instrument(s): Curette to remove Viable and Non-Viable tissue/material. Material removed includes Biofilm after achieving pain control using Lidocaine 4% T opical Solution. No specimens were taken. A time out was conducted at 11:44, prior to the start of the procedure. A Minimum amount of bleeding was controlled with Pressure. The procedure was tolerated well with a pain level of 0 throughout and a pain level of 0 following the procedure. Post Debridement Measurements: 7cm length x 2cm width x 1cm depth; 10.996cm^3 volume. Character of Wound/Ulcer Post Debridement is stable. Severity of Tissue Post Debridement is: Fat layer exposed. Post procedure Diagnosis Wound #19: Same as Pre-Procedure General Notes: Scribed for Dr Lady Gary by Brenton Grills RN.Marland Kitchen Wound #23 Pre-procedure diagnosis of Wound #23 is an Abrasion located on the Midline Umbilicus . There was a Excisional Skin/Subcutaneous Tissue Debridement with a total area of 21.98 sq cm performed by Duanne Guess, MD. With the following instrument(s): Curette to remove Viable and Non-Viable tissue/material. Material removed includes Eschar, Subcutaneous Tissue, and Slough after achieving pain control using Lidocaine 4% T opical Solution. No specimens were taken. A time  out was conducted at 11:44, prior to the start of the procedure. A Minimum amount of bleeding was controlled with Pressure. The procedure was tolerated well with a pain level of 0 throughout and a pain level of 0 following the procedure. Post Debridement Measurements: 7cm length x 4cm width x 0.1cm depth; 2.199cm^3 volume. Character of Wound/Ulcer Post Debridement is stable. Post procedure Diagnosis Wound #23: Same as Pre-Procedure General Notes: Scribed for Dr Lady Gary by Brenton Grills RN.Marland Kitchen Wound #24 Pre-procedure diagnosis of Wound #24 is a Lesion located on the Abdomen - midline . There was a Excisional Skin/Subcutaneous Tissue Debridement with a total area of 1.96 sq cm performed by Duanne Guess, MD. With the following instrument(s): Curette to remove Viable and Non-Viable tissue/material. Material removed includes Subcutaneous Tissue and Slough and after achieving pain control using Lidocaine 4% T opical Solution. No specimens were taken. A time out was conducted at 11:44, prior to the start of the procedure. A Minimum amount of bleeding was controlled with Pressure. The procedure was tolerated well with a pain level of 0 throughout and  a pain level of 0 following the procedure. Post Debridement Measurements: 1cm length x 2.5cm width x 0.1cm depth; 0.196cm^3 volume. Character of Wound/Ulcer Post Debridement is stable. Post procedure Diagnosis Wound #24: Same as Pre-Procedure General Notes: Scribed for Dr Lady Gary by Brenton Grills RN.Marland Kitchen Plan Follow-up Appointments: Return Appointment in 1 week. - **** EXTRA TIME at least 45 mins COMPLEX patient**** Dr. Lady Gary Room 3 Other: - over the counter antifungal spray Anesthetic: (In clinic) Topical Lidocaine 5% applied to wound bed Bathing/ Shower/ Hygiene: May shower and wash wound with soap and water. - with dressing changes Edema Control - Lymphedema / SCD / Other: Elevate legs to the level of the heart or above for 30 minutes daily and/or when  sitting for 3-4 times a day throughout the day. Avoid standing for long periods of time. Patient to wear own compression stockings every day. Off-Loading: Other: - Try and elevate Left leg throughout the day WOUND #19: - Foot Wound Laterality: Plantar, Left Cleanser: Soap and Water 1 x Per Day/30 Days Discharge Instructions: May shower and wash wound with dial antibacterial soap and water prior to dressing change. Cleanser: Wound Cleanser (Generic) 1 x Per Day/30 Days Discharge Instructions: Cleanse the wound with wound cleanser prior to applying a clean dressing using gauze sponges, not tissue or cotton balls. Prim Dressing: Maxorb Extra Ag+ Alginate Dressing, 4x4.75 (in/in) (Generic) 1 x Per Day/30 Days ary Discharge Instructions: Apply to wound bed as instructed Secondary Dressing: ABD Pad, 5x9 (Generic) 1 x Per Day/30 Days Discharge Instructions: Apply over primary dressing as directed. Secondary Dressing: Woven Gauze Sponge, Non-Sterile 4x4 in (Generic) 1 x Per Day/30 Days Discharge Instructions: Apply over primary dressing as directed. Secured With: Elastic Bandage 4 inch (ACE bandage) (Generic) 1 x Per Day/30 Days Discharge Instructions: Secure with ACE bandage as directed. Secured With: [employer], 4.5x3.1 (in/yd) (Generic) 1 x Per Day/30 Days Discharge Instructions: Secure with Kerlix as directed. Secured With: 131M Medipore Scientist, research (life sciences) Surgical T 2x10 (in/yd) (Generic) 1 x Per Day/30 Days ape Discharge Instructions: Secure with tape as directed. WOUND #23: - Umbilicus Wound Laterality: Midline Prim Dressing: MediHoney Gel, tube 1.5 (oz) 1 x Per Day/30 Days ary Discharge Instructions: Apply to wound bed as instructed Secondary Dressing: ABD Pad, 8x10 1 x Per Day/30 Days Discharge Instructions: Apply over primary dressing as directed. Secured With: 131M Medipore H Soft Cloth Surgical T ape, 4 x 10 (in/yd) 1 x Per Day/30 Days Discharge Instructions: Secure with tape as  directed. WOUND #24: - Abdomen - midline Wound Laterality: Topical: Ketoconazole Cream 2% 1 x Per Day/30 Days Discharge Instructions: Apply Ketoconazole as directed Topical: Interdry 1 x Per Day/30 Days Discharge Instructions: Apply to under Pannus 51/2024: The only wounds we were able to assess today were those on her upper abdomen, the one at her umbilicus, and the plantar left foot ulcer. The abdominal sites show evidence of picking and scratching. There is slough and eschar on all of the surfaces. The plantar ulcer on the left has some biofilm on Gawron, Chelise [name] ([id number]) [id number] Page 13 of 16 the surface, but it continues to contract and epithelial bridge is larger. I used a curette to debride slough, subcu, and eschar from the abdominal wounds and biofilm from the plantar foot wound. Will continue to use topical mupirocin to the abdominal wounds, along with Medihoney and a gauze or foam border dressing. Continue Keystone topical antibiotic compound (prescription drug) to the plantar foot wound until the current supply  runs out; no refill necessary. Continue silver alginate. Follow-up in 2 weeks. Electronic Signature(s) Signed: 01/10/2023 12:15:21 PM By: Duanne Guess MD FACS Previous Signature: 01/10/2023 12:13:55 PM Version By: Duanne Guess MD FACS Entered By: Duanne Guess on 01/10/2023 12:15:20 -------------------------------------------------------------------------------- HxROS Details Patient Name: Date of Service: Gane, [name] 01/10/2023 10:45 A [name] Medical Record Number: [id number] Patient Account Number: [id number] Date of Birth/Sex: Treating RN: 1961/05/17 (62 y.o. F) Primary Care Provider: [name] Other Clinician: Referring Provider: Treating Provider/Extender: Vivien Rossetti in Treatment: 25 Information Obtained From Patient Constitutional Symptoms (General Health) Medical History: Past  Medical History Notes: hypothyroidism Eyes Medical History: Positive for: Cataracts - both eyes Negative for: Glaucoma; Optic Neuritis Ear/Nose/Mouth/Throat Medical History: Negative for: Chronic sinus problems/congestion; Middle ear problems Hematologic/Lymphatic Medical History: Positive for: Lymphedema Negative for: Anemia; Hemophilia; Human Immunodeficiency Virus; Sickle Cell Disease Respiratory Medical History: Positive for: Asthma Negative for: Aspiration; Chronic Obstructive Pulmonary Disease (COPD); Pneumothorax; Sleep Apnea; Tuberculosis Cardiovascular Medical History: Positive for: Congestive Heart Failure; Coronary Artery Disease; Hypertension; Peripheral Arterial Disease; Peripheral Venous Disease Negative for: Angina; Arrhythmia; Hypotension; Myocardial Infarction; Phlebitis; Vasculitis Past Medical History Notes: cardiac stents Gastrointestinal Medical History: Negative for: Hepatitis A; Hepatitis B; Hepatitis C Past Medical History Notes: GERD Endocrine Medical History: Positive for: Type II Diabetes Negative for: Type I Diabetes Past Medical History Notes: Hypothyroidism Cazarez, Shahara [name] ([id number]) [id number] Page 14 of 16 Time with diabetes: since 2002 Treated with: Insulin, Oral agents Blood sugar tested every day: Yes Tested : 2-3 times a day Genitourinary Medical History: Negative for: End Stage Renal Disease Immunological Medical History: Negative for: Lupus Erythematosus; Raynauds; Scleroderma 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 Integumentary (Skin) Medical History: Negative for: History of Burn Musculoskeletal Medical History: Positive for: Rheumatoid Arthritis Negative for: Gout; Osteoarthritis; Osteomyelitis Past Medical History Notes: Fibromyalgia, Scoliosis, Neurologic Medical History: Positive for: Neuropathy Negative for: Dementia; Quadriplegia;  Paraplegia; Seizure Disorder Oncologic Medical History: Negative for: Received Chemotherapy; Received Radiation Psychiatric Medical History: Negative for: Anorexia/bulimia; Confinement Anxiety HBO Extended History Items Eyes: Cataracts Immunizations Pneumococcal Vaccine: Received Pneumococcal Vaccination: Yes Received Pneumococcal Vaccination On or After 60th Birthday: Yes Implantable Devices None Hospitalization / Surgery History Type of Hospitalization/Surgery 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 Family and Social History Cancer: No; Diabetes: Yes - Mother; Heart Disease: Yes - Mother,Father,Siblings; Hereditary Spherocytosis: No; Hypertension: Yes - Mother,Father,Siblings; Kidney Disease: No; Lung Disease: Yes - Father; Seizures: No; Stroke: Yes - Mother; Thyroid Problems: No; Tuberculosis: No; Former smoker - quit 15 years ago; Marital Status - Married; Alcohol Use: Never; Drug Use: No History; Caffeine Use: Rarely; Financial Concerns: No; Food, Clothing or Shelter Needs: No; Support System Lacking: No; Transportation Concerns: No Electronic Signature(s) Signed: 01/10/2023 12:16:21 PM By: Duanne Guess MD FACS Entered By: Duanne Guess on 01/10/2023 12:11:26 Patlan, Rael [name] ([id number]) [id number] Page 15 of 16 -------------------------------------------------------------------------------- SuperBill Details Patient Name: Date of Service: Greaser, [name] 01/10/2023 Medical Record Number: [id number] Patient Account Number: [id number] Date of Birth/Sex: Treating RN: 1960-11-04 (62 y.o. Gevena Mart Primary Care Provider: [name] Other Clinician: Referring Provider: Treating Provider/Extender: Vivien Rossetti in Treatment: 25 Diagnosis Coding ICD-10 Codes Code Description [phone] Non-pressure chronic ulcer of left heel and midfoot  with necrosis of muscle L30.4 Erythema intertrigo L97.112 Non-pressure chronic ulcer of right thigh with fat layer exposed L98.492 Non-pressure chronic ulcer of  skin of other sites with fat layer exposed L97.822 Non-pressure chronic ulcer of other part of left lower leg with fat layer exposed M86.9 Osteomyelitis, unspecified E11.621 Type 2 diabetes mellitus with foot ulcer E66.01 Morbid (severe) obesity due to excess calories M35.00 Sjogren syndrome, unspecified I73.9 Peripheral vascular disease, unspecified I50.32 Chronic diastolic (congestive) heart failure 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 Facility Procedures : 3 CPT4 Code: [id number] Description: 11042 - DEB SUBQ TISSUE 20 SQ CM/< ICD-10 Diagnosis Description L98.492 Non-pressure chronic ulcer of skin of other sites with fat layer exposed Modifier: Quantity: 1 : 3 CPT4 Code: 4540981 Description: 11045 - DEB SUBQ TISS EA ADDL 20CM ICD-10 Diagnosis Description L98.492 Non-pressure chronic ulcer of skin of other sites with fat layer exposed Modifier: Quantity: 1 : 7 CPT4 Code: 1914782 Description: 97597 - DEBRIDE WOUND 1ST 20 SQ CM OR < ICD-10 Diagnosis Description 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 Modifier: Quantity: 1 Physician Procedures : CPT4 Code Description Modifier [id number] 99214 - WC PHYS LEVEL 4 - EST PT 25 ICD-10 Diagnosis Description 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 L98.492 Non-pressure chronic ulcer of skin of other sites with  fat layer exposed I50.32 Chronic diastolic (congestive) heart failure E66.01 Morbid (severe) obesity due to excess calories Quantity: 1 : [id number] 11042 - WC PHYS SUBQ TISS 20 SQ CM ICD-10 Diagnosis Description L98.492 Non-pressure chronic ulcer of skin of other sites with fat layer exposed Quantity: 1 : [id number] 11045 - WC PHYS SUBQ TISS EA ADDL 20 CM ICD-10 Diagnosis Description  L98.492 Non-pressure chronic ulcer of skin of other sites with fat layer exposed Quantity: 1 : [id number] 97597 - WC PHYS DEBR WO ANESTH 20 SQ CM ICD-10 Diagnosis Description 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

## 2023-02-22 ENCOUNTER — Other Ambulatory Visit: Payer: Self-pay | Admitting: Physical Medicine and Rehabilitation

## 2023-02-23 ENCOUNTER — Encounter: Payer: Commercial Managed Care - HMO | Attending: Registered Nurse | Admitting: Physical Medicine and Rehabilitation

## 2023-02-23 VITALS — BP 131/71 | HR 91

## 2023-02-23 DIAGNOSIS — L97529 Non-pressure chronic ulcer of other part of left foot with unspecified severity: Secondary | ICD-10-CM | POA: Insufficient documentation

## 2023-02-23 DIAGNOSIS — Z89611 Acquired absence of right leg above knee: Secondary | ICD-10-CM | POA: Diagnosis present

## 2023-02-23 DIAGNOSIS — L8990 Pressure ulcer of unspecified site, unspecified stage: Secondary | ICD-10-CM | POA: Insufficient documentation

## 2023-02-23 DIAGNOSIS — M25562 Pain in left knee: Secondary | ICD-10-CM | POA: Insufficient documentation

## 2023-02-23 DIAGNOSIS — Z794 Long term (current) use of insulin: Secondary | ICD-10-CM

## 2023-02-23 DIAGNOSIS — E11621 Type 2 diabetes mellitus with foot ulcer: Secondary | ICD-10-CM | POA: Insufficient documentation

## 2023-02-23 MED ORDER — OXYCODONE HCL 20 MG PO TABS: 1.0000 | ORAL_TABLET | Freq: Three times a day (TID) | ORAL | 0 refills | Status: DC | PRN

## 2023-02-23 NOTE — Patient Instructions (Signed)
Wegoy Ozempic  Semaglutide  GLP-1 agonists

## 2023-02-23 NOTE — Progress Notes (Signed)
Subjective:    Patient ID: [name], female    DOB: [birth year], 62 y.o.   MRN: [id number]  HPI [name] is a 62 year old woman who presents for hospital follow-up after CIR admission for right AKA.   1) Right AKA -on aspirin -follows with wound care -doing ok with her wheelchair mobility -therapy has been going very well, she feels it is going very well.  -she asks about getting prosthesis -has not been hurting much -healing well, no more drainage -still open -she had trouble getting into hyperbaric chamber -she had done hyperbaric oxygen therapy prior to her amputation  2) Residual limb pain -she has been using the tramadol and oxycodone.  -oxycodone is more effective for her.  -she takes oxycodone 20mg  every 8 hours approximately.  -she uses tylenol -bought a red light therapy  3) Confusion: -husband notes that this is worse with the tramadol.   4) Sciatic pain: -pain has returned.  -has been horrific -voltaren gel helps the most   5) Right lower extremity phantom pain: -mild  6) Joint pain: -diffuse, present in elbows, shoulders. -worse with weather -has never tried Infrared light -Uses heating pads and ice -she uses voltaren gels  7) Left foot ulcer: -following with wound care -she is not following [name] because she did not want an amputation that was recommended.   Pain Inventory Average Pain 7 Pain Right Now 7 My pain is constant, sharp, burning, stabbing, tingling, and aching  In the last 24 hours, has pain interfered with the following? General activity 7 Relation with others 8 Enjoyment of life 8 What TIME of day is your pain at its worst? evening and night Sleep (in general) Poor  Pain is worse with: walking, bending, sitting, inactivity, some activites, and weather Pain improves with: rest, heat/ice, therapy/exercise, pacing activities, medication, and injections Relief from Meds: 7      Family History  Problem Relation  Age of Onset   CAD Mother    Hypertension Mother    Heart attack Mother    Stroke Mother    CAD Father    Heart attack Father    Allergic rhinitis Father    Asthma Father    Hypertension Brother    Hypertension Brother    Pancreatic cancer Paternal Aunt    Breast cancer Paternal Aunt    Lung cancer Paternal Aunt    Allergic rhinitis Son    Asthma Son    Social History   Socioeconomic History   Marital status: Married    Spouse name: [name]   Number of children: 1   Years of education: 11   Highest education level: GED or equivalent  Occupational History    Comment: Not currrently working ( last worked 2004)  Tobacco Use   Smoking status: Former    Packs/day: 1.00    Years: 19.00    Additional pack years: 0.00    Total pack years: 19.00    Types: Cigarettes    Start date: 27    Quit date: 02/11/2004    Years since quitting: 19.0    Passive exposure: Past   Smokeless tobacco: Never  Vaping Use   Vaping Use: Never used  Substance and Sexual Activity   Alcohol use: Yes    Comment: RARE Wine   Drug use: Not Currently    Types: Marijuana    Comment: "only in my teens"   Sexual activity: Yes    Birth control/protection: Surgical  Comment: Hysterectomy  Other Topics Concern   Not on file  Social History Narrative   Not on file   Social Determinants of Health   Financial Resource Strain: Low Risk  (12/27/2021)   Overall Financial Resource Strain (CARDIA)    Difficulty of Paying Living Expenses: Not hard at all  Food Insecurity: No Food Insecurity (06/14/2022)   Hunger Vital Sign    Worried About Running Out of Food in the Last Year: Never true    Ran Out of Food in the Last Year: Never true  Transportation Needs: No Transportation Needs (06/14/2022)   PRAPARE - Administrator, Civil Service (Medical): No    Lack of Transportation (Non-Medical): No  Physical Activity: Inactive (08/25/2021)   Exercise Vital Sign    Days of Exercise per Week:  0 days    Minutes of Exercise per Session: 0 min  Stress: Stress Concern Present (08/25/2021)   [employer] of Occupational Health - Occupational Stress Questionnaire    Feeling of Stress : Very much  Social Connections: Moderately Integrated (08/25/2021)   Social Connection and Isolation Panel [NHANES]    Frequency of Communication with Friends and Family: More than three times a week    Frequency of Social Gatherings with Friends and Family: More than three times a week    Attends Religious Services: More than 4 times per year    Active Member of [employer] or Organizations: No    Attends Banker Meetings: Never    Marital Status: Married   Past Surgical History:  Procedure Laterality Date   ABDOMINAL HYSTERECTOMY  06/2005   "w/right ovariy"   AMPUTATION Right 01/28/2020    RIGHT FOURTH AND FIFTH RAY AMPUTATION   AMPUTATION Right 01/28/2020   Procedure: RIGHT FOURTH AND FIFTH RAY AMPUTATION,;  Surgeon: [name], MD;  Location: [facility] OR;  Service: Orthopedics;  Laterality: Right;   AMPUTATION Right 06/01/2021   Procedure: RIGHT BELOW KNEE AMPUTATION;  Surgeon: [name], MD;  Location: [facility] OR;  Service: Orthopedics;  Laterality: Right;   AMPUTATION Right 07/22/2021   Procedure: RIGHT ABOVE KNEE AMPUTATION;  Surgeon: [name], MD;  Location: [facility] OR;  Service: Orthopedics;  Laterality: Right;   APPENDECTOMY     BREAST BIOPSY Bilateral    9 total (04/18/2018)   CORONARY ANGIOPLASTY WITH STENT PLACEMENT  10/01/2015   normal LM, 30% LAD, 85% mid RCA and 95% distal RCA s/p PCI of the mid to distal RCA and now on DAPT with ASA and Ticagrelor.     CORONARY STENT INTERVENTION Right 04/18/2018   Procedure: CORONARY STENT INTERVENTION;  Surgeon: [name], MD;  Location: [facility];  Service: Cardiovascular;  Laterality: Right;   DILATION AND CURETTAGE OF UTERUS     FRACTURE SURGERY     LAPAROSCOPIC CHOLECYSTECTOMY     LEFT HEART CATH AND CORONARY  ANGIOGRAPHY N/A 04/18/2018   Procedure: LEFT HEART CATH AND CORONARY ANGIOGRAPHY;  Surgeon: [name], MD;  Location: [facility];  Service: Cardiovascular;  Laterality: N/A;   MUSCLE BIOPSY Left    "leg"   OOPHORECTOMY  07/2010   RADIOLOGY WITH ANESTHESIA N/A 05/25/2016   Procedure: RADIOLOGY WITH ANESTHESIA;  Surgeon: Medication Radiologist, MD;  Location: [facility] OR;  Service: Radiology;  Laterality: N/A;   STUMP REVISION Right 06/29/2021   Procedure: REVISION RIGHT BELOW KNEE AMPUTATION;  Surgeon: [name], MD;  Location: [facility] OR;  Service: Orthopedics;  Laterality: Right;  TEE WITHOUT CARDIOVERSION N/A 06/01/2021   Procedure: TRANSESOPHAGEAL ECHOCARDIOGRAM (TEE);  Surgeon: Sande Rives, MD;  Location[facility] OR;  Service: Cardiovascular;  Laterality: N/A;   TONSILLECTOMY AND ADENOIDECTOMY     WRIST FRACTURE SURGERY Left    "crushed it"   Past Medical History:  Diagnosis Date   Aortic stenosis    mild AS by echo 02/2021   Asthma    "on daily RX and rescue inhaler" (04/18/2018)   Chronic diastolic CHF (congestive heart failure) (HCC) 09/2015   Chronic lower back pain    Chronic neck pain    Chronic pain syndrome    Fentanyl Patch   Colon polyps    Coronary artery disease    cath with normal LM, 30% LAD, 85% mid RCA and 95% distal RCA s/p PCI of the mid to distal RCA and now on DAPT with ASA and Ticagrelor.     Eczema    Excessive daytime sleepiness 11/26/2015   Fibromyalgia    Gallstones    GERD (gastroesophageal reflux disease)    Heart murmur    "noted for the 1st time on 04/18/2018"   History of blood transfusion 07/2010   "S/P oophorectomy"   History of gout    History of hiatal hernia 1980s   "gone now" (04/18/2018)   Hyperlipidemia    Hypertension    takes Metoprolol and Enalapril daily   Hypothyroidism    takes Synthroid daily   IBS (irritable bowel syndrome)    Migraine    "nothing in the 2000s" (04/18/2018)   Mixed connective tissue disease  (HCC)    NAFLD (nonalcoholic fatty liver disease)    Pneumonia    "several times" (04/18/2018)   PVC's (premature ventricular contractions)    noted on event monitor 02/2021   Rheumatoid arthritis (HCC)    "hands, elbows, shoulders, probably knees" (04/18/2018)   Scoliosis    Sjogren's syndrome (HCC)    Sleep apnea    mild - does not use cpap   Spondylosis    Type II diabetes mellitus (HCC)    takes Metformin and Hum R daily (04/18/2018)   Walker as ambulation aid    also uses wheelchair   BP 131/71   Pulse 91   SpO2 (!) 88%   Opioid Risk Score:   Fall Risk Score:  `1  Depression screen [facility] 2/9     01/30/2023   10:04 AM 01/01/2023    9:58 AM 11/28/2022    9:45 AM 08/22/2022   11:21 AM 07/07/2022    9:16 AM 06/27/2022   11:39 AM 05/02/2022   11:56 AM  Depression screen PHQ 2/9  Decreased Interest 1 1 1 1  0 1 1  Down, Depressed, Hopeless 1 1 1 1 1 1 1   PHQ - 2 Score 2 2 2 2 1 2 2     Review of Systems  Constitutional: Negative.   HENT: Negative.    Eyes: Negative.   Respiratory:  Positive for shortness of breath and wheezing.   Endocrine: Negative.   Genitourinary: Negative.  Negative for decreased urine volume.  Musculoskeletal:  Positive for back pain and gait problem.       TINGLING, SPASMS  Skin:  Positive for wound.  Allergic/Immunologic: Negative.   Neurological:  Positive for dizziness and tremors.  Hematological: Negative.   Psychiatric/Behavioral:  Positive for sleep disturbance.   All other systems reviewed and are negative.      Objective:   Physical Exam Gen: no distress, normal appearing, weight  291 lbs, 149/79 Gen: no distress, normal appearing HEENT: oral mucosa pink and moist, NCAT Cardio: Reg rate Chest: normal effort, normal rate of breathing Abd: soft, non-distended Ext: no edema Psych: pleasant, normal affect Skin: intact Neuro: Alert and oriented x3 Musculoskeletal: Right AKA with shrinker    Assessment & Plan:  1) Diffuse joint  pain: -Discussed current symptoms of pain and history of pain.  -prescribed Pennsaid -follow monthly with Riley Lam  -refilled oxycodone 20mg  TID PRN for severe pain .-discussed semaglutide and its benefits for weight loss and joint pain Prescribed Zynex Nexwave  -Discussed benefits of exercise in reducing pain. -Discussed following foods that may reduce pain: 1) Ginger (especially studied for arthritis)- reduce leukotriene production to decrease inflammation 2) Blueberries- high in phytonutrients that decrease inflammation 3) Salmon- marine omega-3s reduce joint swelling and pain 4) Pumpkin seeds- reduce inflammation 5) dark chocolate- reduces inflammation 6) turmeric- reduces inflammation 7) tart cherries - reduce pain and stiffness 8) extra virgin olive oil - its compound olecanthal helps to block prostaglandins  9) chili peppers- can be eaten or applied topically via capsaicin 10) mint- helpful for headache, muscle aches, joint pain, and itching 11) garlic- reduces inflammation  Link to further information on diet for chronic pain: [email or web address]    2) Diabetes: -check CBGs daily, log, and bring log to follow-up appointment -avoid sugar, bread, pasta, rice -encouraged Dexcom  -avoid snacking -perform daily foot exam and at least annual eye exam -check CBGs daily, log, and bring log to follow-up appointment -avoid sugar, bread, pasta, rice -avoid snacking -perform daily foot exam and at least annual eye exam -try to incorporate into your diet some of the following foods which are good for diabetes: 1) cinnamon- imitates effects of insulin, increasing glucose transport into cells ([place] or [place] cinnamon is best, least processed) 2) nuts- can slow down the blood sugar response of carbohydrate rich foods 3) oatmeal- contains and anti-inflammatory compound avenanthramide 4) whole-milk yogurt (best  types are no sugar, [place] yogurt, or goat/sheep yogurt) 5) beans- high in protein, fiber, and vitamins, low glycemic index 6) broccoli- great source of vitamin A and C 7) quinoa- higher in protein and fiber than other grains 8) spinach- high in vitamin A, fiber, and protein 9) olive oil- reduces glucose levels, LDL, and triglycerides 10) salmon- excellent amount of omega-3-fatty acids 11) walnuts- rich in antioxidants 12) apples- high in fiber and quercetin 13) carrots- highly nutritious with low impact on blood sugar 14) eggs- improve HDL (good cholesterol), high in protein, keep you satiated 15) turmeric: improves blood sugars, cardiovascular disease, and protects kidney health 16) garlic: improves blood sugar, blood pressure, pain 17) tomatoes: highly nutritious with low impact on blood sugar   3) Sciatic pain: -prescribed lidocaine patch -continue lyrica to 50 mg BID -discussed steroid injections  4) Impaired mobility and ADLs Patient was seen for mobility assessment, referring patient to PT eval for power wheelchair  5) Left foot ulcer:  -continue to follow with wound care  6) History of right AKA: -discussed that she used hyperbaric oxygen prior to this  7) Multiple bed sores -continue to follow with wound care  8) Left knee pain: -discussed that this is worst with weight bearing -continue head -continue voltaren gel -discussed Xrs -discussed viscosupplementation for her left knee.   9) Obesity: -discussed that she cannot weigh herself but she would like to -discussed that she has decreased her eating but likes a little desert.

## 2023-02-27 ENCOUNTER — Ambulatory Visit: Payer: Commercial Managed Care - HMO | Attending: Physical Medicine and Rehabilitation | Admitting: Physical Therapy

## 2023-02-27 DIAGNOSIS — R2681 Unsteadiness on feet: Secondary | ICD-10-CM | POA: Insufficient documentation

## 2023-02-27 DIAGNOSIS — M6281 Muscle weakness (generalized): Secondary | ICD-10-CM | POA: Diagnosis present

## 2023-02-27 DIAGNOSIS — S78119A Complete traumatic amputation at level between unspecified hip and knee, initial encounter: Secondary | ICD-10-CM | POA: Diagnosis not present

## 2023-02-27 DIAGNOSIS — S88111S Complete traumatic amputation at level between knee and ankle, right lower leg, sequela: Secondary | ICD-10-CM | POA: Diagnosis not present

## 2023-02-27 DIAGNOSIS — R2689 Other abnormalities of gait and mobility: Secondary | ICD-10-CM | POA: Insufficient documentation

## 2023-02-27 NOTE — Therapy (Signed)
OUTPATIENT PHYSICAL THERAPY WHEELCHAIR EVALUATION   Patient Name: [name] MRN: [id number] DOB:1961-01-11, 62 y.o., female Today's Date: 02/27/2023  END OF SESSION:  PT End of Session - 02/27/23 1402     Visit Number 1    Number of Visits 1    Date for PT Re-Evaluation 02/27/23    Authorization Type [employer]    PT Start Time 1400    PT Stop Time 1505    PT Time Calculation (min) 65 min    Activity Tolerance Patient limited by pain    Behavior During Therapy [facility] for tasks assessed/performed             Past Medical History:  Diagnosis Date   Aortic stenosis    mild AS by echo 02/2021   Asthma    "on daily RX and rescue inhaler" (04/18/2018)   Chronic diastolic CHF (congestive heart failure) (HCC) 09/2015   Chronic lower back pain    Chronic neck pain    Chronic pain syndrome    Fentanyl Patch   Colon polyps    Coronary artery disease    cath with normal LM, 30% LAD, 85% mid RCA and 95% distal RCA s/p PCI of the mid to distal RCA and now on DAPT with ASA and Ticagrelor.     Eczema    Excessive daytime sleepiness 11/26/2015   Fibromyalgia    Gallstones    GERD (gastroesophageal reflux disease)    Heart murmur    "noted for the 1st time on 04/18/2018"   History of blood transfusion 07/2010   "S/P oophorectomy"   History of gout    History of hiatal hernia 1980s   "gone now" (04/18/2018)   Hyperlipidemia    Hypertension    takes Metoprolol and Enalapril daily   Hypothyroidism    takes Synthroid daily   IBS (irritable bowel syndrome)    Migraine    "nothing in the 2000s" (04/18/2018)   Mixed connective tissue disease (HCC)    NAFLD (nonalcoholic fatty liver disease)    Pneumonia    "several times" (04/18/2018)   PVC's (premature ventricular contractions)    noted on event monitor 02/2021   Rheumatoid arthritis (HCC)    "hands, elbows, shoulders, probably knees" (04/18/2018)   Scoliosis    Sjogren's syndrome (HCC)    Sleep apnea    mild - does not use cpap    Spondylosis    Type II diabetes mellitus (HCC)    takes Metformin and Hum R daily (04/18/2018)   Walker as ambulation aid    also uses wheelchair   Past Surgical History:  Procedure Laterality Date   ABDOMINAL HYSTERECTOMY  06/2005   "w/right ovariy"   AMPUTATION Right 01/28/2020    RIGHT FOURTH AND FIFTH RAY AMPUTATION   AMPUTATION Right 01/28/2020   Procedure: RIGHT FOURTH AND FIFTH RAY AMPUTATION,;  Surgeon: [name], MD;  Location: [facility] OR;  Service: Orthopedics;  Laterality: Right;   AMPUTATION Right 06/01/2021   Procedure: RIGHT BELOW KNEE AMPUTATION;  Surgeon: [name], MD;  Location: [facility] OR;  Service: Orthopedics;  Laterality: Right;   AMPUTATION Right 07/22/2021   Procedure: RIGHT ABOVE KNEE AMPUTATION;  Surgeon: [name], MD;  Location: [facility] OR;  Service: Orthopedics;  Laterality: Right;   APPENDECTOMY     BREAST BIOPSY Bilateral    9 total (04/18/2018)   CORONARY ANGIOPLASTY WITH STENT PLACEMENT  10/01/2015   normal LM, 30% LAD, 85% mid RCA and 95% distal  RCA s/p PCI of the mid to distal RCA and now on DAPT with ASA and Ticagrelor.     CORONARY STENT INTERVENTION Right 04/18/2018   Procedure: CORONARY STENT INTERVENTION;  Surgeon: [name], MD;  Location: [facility] INVASIVE [facility] LAB;  Service: Cardiovascular;  Laterality: Right;   DILATION AND CURETTAGE OF UTERUS     FRACTURE SURGERY     LAPAROSCOPIC CHOLECYSTECTOMY     LEFT HEART CATH AND CORONARY ANGIOGRAPHY N/A 04/18/2018   Procedure: LEFT HEART CATH AND CORONARY ANGIOGRAPHY;  Surgeon: [name], MD;  Location: [facility];  Service: Cardiovascular;  Laterality: N/A;   MUSCLE BIOPSY Left    "leg"   OOPHORECTOMY  07/2010   RADIOLOGY WITH ANESTHESIA N/A 05/25/2016   Procedure: RADIOLOGY WITH ANESTHESIA;  Surgeon: Medication Radiologist, MD;  Location: [facility] OR;  Service: Radiology;  Laterality: N/A;   STUMP REVISION Right 06/29/2021   Procedure: REVISION RIGHT BELOW KNEE AMPUTATION;  Surgeon:  [name], MD;  Location: [facility] OR;  Service: Orthopedics;  Laterality: Right;   TEE WITHOUT CARDIOVERSION N/A 06/01/2021   Procedure: TRANSESOPHAGEAL ECHOCARDIOGRAM (TEE);  Surgeon: Sande Rives, MD;  Location: [facility] OR;  Service: Cardiovascular;  Laterality: N/A;   TONSILLECTOMY AND ADENOIDECTOMY     WRIST FRACTURE SURGERY Left    "crushed it"   Patient Active Problem List   Diagnosis Date Noted   RLS (restless legs syndrome) 01/08/2023   Osteomyelitis (HCC) 07/07/2022   Medication management 07/07/2022   Diabetic ulcer of left midfoot associated with diabetes mellitus due to underlying condition, with necrosis of muscle (HCC)    Diabetic foot infection (HCC) 06/14/2022   Sacral decubitus ulcer, stage II (HCC) 12/24/2021   Class 3 obesity (HCC) 12/23/2021   Iron deficiency anemia 12/23/2021   Acute on chronic diastolic CHF (congestive heart failure) (HCC) 12/22/2021   Hyperkalemia 12/22/2021   Left leg cellulitis 12/20/2021   Peripheral vascular disease, unspecified (HCC) 10/10/2021   Dehiscence of amputation stump (HCC)    Acute blood loss anemia 06/24/2021   Postoperative wound infection 06/24/2021   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)    Bacteremia due to Enterococcus 05/28/2021   Digestive disorder 05/18/2021   Bilateral lower extremity edema 05/02/2021   Statin myopathy 03/04/2021   Aortic stenosis 03/04/2021   Sjogren's syndrome (HCC) 02/11/2020   Cutaneous abscess of right foot    Statin intolerance 08/16/2019   Gram-negative bacteremia 10/18/2018   Streptococcal bacteremia 10/18/2018   CAD S/P percutaneous coronary angioplasty 04/19/2018   Essential hypertension    Moderate persistent asthma 03/21/2018   NAFLD (nonalcoholic fatty liver disease)    Recurrent cellulitis of lower extremity 01/02/2018   Cellulitis of lower leg 01/02/2018   Chronic diarrhea 05/08/2017   H/O Clostridium difficile infection 05/08/2017   Rectal  bleeding 05/08/2017   Hyperbilirubinemia 04/29/2016   Hyponatremia 04/29/2016   Cellulitis of right foot 03/09/2016   Allergic rhinitis due to pollen 02/15/2016   Anaphylactic reaction due to food 02/10/2016   Type 2 diabetes mellitus with hyperglycemia, with long-term current use of insulin (HCC) 02/10/2016   Gastroesophageal reflux disease without esophagitis 02/10/2016   Atopic eczema 02/10/2016   Cervical nerve root disorder 12/15/2015   Lumbar radiculopathy 12/15/2015   Daytime somnolence 11/26/2015   Venous stasis dermatitis of both lower extremities 02/11/2015   Morbid obesity (HCC) 06/19/2014   Abnormal LFTs 03/26/2014   B-complex deficiency 03/26/2014   Benign essential HTN 03/26/2014   Chronic  pain associated with significant psychosocial dysfunction 03/26/2014   Diaphragmatic hernia 03/26/2014   Gastroesophageal reflux disease 03/26/2014   Hammer toe 03/26/2014   H/O neoplasm 03/26/2014   Adaptive colitis 03/26/2014   Deafness, sensorineural 03/26/2014   Fibromyalgia 01/20/2014   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/20/2014   Hyperlipidemia 11/11/2012   Mixed connective tissue disease (HCC) 11/11/2012   Hypothyroidism 11/11/2012   Uncomplicated asthma 11/11/2012   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HCC) 02/20/2012   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 Pt reports that  she had her R AKA performed about 2 years ago (Nov 2022 per chart) and that regarding her LLE she has a wound on the bottom of her foot and a torn meniscus in her L knee. Patient enters this clinic in a manual wheelchair but reports she has difficulty propelling herself in the chair due to impaired endurance as well as chronic shoulder pain. Pt reports she has chronic joint pain especially in her shoulders and elbows that is flared up by trying to push herself in the wheelchair.  Pt also reports she has been told not to stand on her LLE other than for transfers with her RW due to the wound on the bottom of her foot. Pt reports that she spends most of her day laying in bed because her manual wheelchair is uncomfortable to sit in for extended periods of time and she is unable to perform adequate pressure relief while seated in the manual wheelchair.   PRECAUTIONS: Fall  WEIGHT BEARING RESTRICTIONS Yes only WB on LLE for transfers    OCCUPATION: on disability  PLOF:  Needs assistance with ADLs, Needs assistance with homemaking, and Needs assistance with transfers  PATIENT GOALS: to obtain a power wheelchair         MEDICAL HISTORY:  Primary diagnosis onset: 07/22/2021 Diagnosis  Code: M35.1 Diagnosis: mixed connective tissue disease   Diagnosis code:  Z89.611     Diagnosis: Acquired absence of right leg above knee  Diagnosis  Code:  Diagnosis:   [] Progressive disease  Relevant future  surgeries: N/A    Height: 5'2" Weight: 309.4 lbs Explain recent changes or trends in weight:  N/A    History:  Past Medical History:  Diagnosis Date   Aortic stenosis    mild AS by echo 02/2021   Asthma    "on daily RX and rescue inhaler" (04/18/2018)   Chronic diastolic CHF (congestive heart failure) (HCC) 09/2015   Chronic lower back pain    Chronic neck pain    Chronic pain syndrome    Fentanyl Patch   Colon polyps    Coronary artery disease    cath with normal LM, 30% LAD, 85% mid RCA and 95%  distal RCA s/p PCI of the mid to distal RCA and now on DAPT with ASA and Ticagrelor.     Eczema    Excessive daytime sleepiness 11/26/2015   Fibromyalgia    Gallstones    GERD (gastroesophageal reflux disease)    Heart murmur    "noted for the 1st time on 04/18/2018"   History of blood transfusion 07/2010   "S/P oophorectomy"   History of gout    History of hiatal hernia 1980s   "gone now" (04/18/2018)   Hyperlipidemia    Hypertension    takes Metoprolol and Enalapril daily   Hypothyroidism    takes Synthroid daily   IBS (irritable bowel syndrome)    Migraine    "nothing in the 2000s" (04/18/2018)   Mixed connective tissue disease (HCC)    NAFLD (nonalcoholic fatty liver disease)    Pneumonia    "several times" (04/18/2018)   PVC's (premature ventricular contractions)    noted on event monitor 02/2021   Rheumatoid arthritis (HCC)    "hands, elbows, shoulders, probably knees" (04/18/2018)   Scoliosis    Sjogren's syndrome (HCC)    Sleep apnea    mild - does not use cpap   Spondylosis    Type II diabetes mellitus (HCC)    takes Metformin and Hum R daily (04/18/2018)   Walker as ambulation aid    also uses wheelchair            Cardio Status:  Functional Limitations: decreased endurance  [] Intact  [x]  Impaired      Respiratory Status:  Functional Limitations:   [x] Intact  [] Impaired   [] SOB [] COPD [] O2 Dependent ______LPM  [] Ventilator Dependent  Resp equip:                                                     Objective Measure(s):   Orthotics:   [x] Amputee:     R AKA                                                      [] Prosthesis: has not obtained a RLE prosthetic due to body habitus       HOME ENVIRONMENT:  [x] House [] Condo/town home [] Apartment [] Asst living [] LTCF         [x] Own  [] Rent   [] Lives alone [x] Lives with others - husband          24/7 assist                 Hours without assistance: N/A  [  x]Home is accessible to patient: ramped entrance and can stay on main  level of home                Storage of wheelchair:  [x] In home   [] Other Comments:        COMMUNITY :  TRANSPORTATION:  [] Car [x] Therapist, occupational [] Adapted w/c Lift []  Ambulance [] Other:                     [] Sits in wheelchair during transport   Where is w/c stored during transport? Plan to attach lift to back of van and/or utilize ramp to bring chair into [employer] [] [employer]  []  [employer]  r   [] Self-Driver       Drive while in  Biomedical scientist [] yes [x] no   Employment and/or school: N/A Specific requirements pertaining to mobility        Other:  COMMUNICATION:  Verbal Communication  [x] WFL [] receptive [] WFL [] expressive [] Understandable  [] Difficult to understand  [] non-communicative  Primary Language:_____English_________ 2nd:_____________  Communication provided by:[x] Patient [] Family [] Caregiver [] Translator   [] Uses an Paramedic device     Manufacturer/Model :                                                                MOBILITY/BALANCE:  Sitting Balance  Standing Balance  Transfers  Ambulation   [] WFL      [] WFL  [x] Independent  []  Independent   [x] Uses UE for balance in sitting Comments: unable to maintain sitting balance EOM without UE support [x] Uses UE/device for stability Comments: able to stand with use of standard walker but also has NWB orders on her LLE except for transfers []  Min assist  []  Ambulates independently with       device:___________________      []  Mod assist  []  Able to ambulate ______ feet        safely/functionally/independently   []  Min assist  []  Min assist  []  Max assist  []  Non-functional ambulator         History/High risk of falls   []  Mod assist  []  Mod assist  []  Dependent  [x]  Unable to ambulate   []  Max  assist  []  Max assist  Transfer method:[] 1 person [] 2 person [] sliding board [] squat pivot [x] stand pivot [] mechanical patient lift  [] other:   []  Unable  []  Unable    Fall History: # of falls in the past 6 months?  0 # of "near" falls in the past 6 months? Several per month, slides off the side of the bed; pt reports that her husband helps get her back up by helping her to boost up step stools to get to her wheelchair    CURRENT SEATING / MOBILITY:  Current Mobility Device: [] None [] Cane/Walker [x] Manual [] Dependent [] Dependent w/ Tilt rScooter  [] Power (type of control):   Manufacturer:  Model:  Serial #:   Size:  Color:  Age:   Purchased by whom: patient at yard sale  Current condition of mobility base: in usable condition  Current seating system:  [id number] manual wheelchair  Age of seating system:  2+ years  Describe posture in present seating system: R lateral lean of trunk due to chair being too large for patient; hips windswept to the R   Is the current mobility meeting medical necessity?:  [] Yes [x] No Describe: Patient is unable to perform pressure relief effectively in her current wheelchair due to body habitus limiting her ability to perform and hold a wheelchair push-up and limiting her ability to perform lateral or anterior leans safely or effectively. Additionally, pt is able to stand to a RW to perform pressure relief but due to a wound on the plantar aspect of her L foot it is not safe for her to being standing other than for transfers during the day and she should not be spending an extended period of time standing on this limb. Also, she is unable to independently and effectively propel herself in her manual wheelchair further than 10 ft before onset of fatigue and shoulder pain. Patient needs power mobility in order to be able to independently and efficient access her home and complete MRADLs as well as to perform effective pressure relief every 30 minutes.                                    Ability to complete Mobility-Related Activities of Daily Living (MRADL's) with Current Mobility Device:   Move room to room  [] Independent  [x] Min [] Mod  [] Max assist  [] Unable  Comments:   Meal prep  [] Independent  [] Min [] Mod [] Max assist  [x] Unable    Feeding  [x] Independent  [] Min [] Mod [] Max assist  [] Unable    Bathing  [] Independent  [x] Min [] Mod [] Max assist  [] Unable    Grooming  [] Independent  [x] Min [] Mod [] Max assist  [] Unable    UE dressing  [] Independent  [x] Min [] Mod [] Max assist  [] Unable    LE dressing  [] Independent   [x] Min [] Mod [] Max assist  [] Unable    Toileting  [] Independent  [] Min [x] Mod [] Max assist  [] Unable    Bowel Mgt: [x]  Continent []  Incontinent []  Accidents []  Diapers []  Colostomy []  Bowel Program:  Bladder Mgt: [x]  Continent []  Incontinent []  Accidents []  Diapers []  Urinal []  Intermittent Cath []  Indwelling Cath []  Supra-pubic Cath     Current Mobility Equipment Trialed/ Ruled Out:    Does not meet mobility needs due to:    Mark all boxes that indicate inability to use the specific equipment listed     Meets needs for safe  independent functional  ambulation  / mobility    Risk of  Falling or History of Falls    Enviromental limitations      Cognition    Safety concerns with  physical ability    Decreased / limitations endurance  & strength     Decreased / limitations  motor skills  & coordination    Pain    Pace /  Speed    Cardiac and/or  respiratory condition    Contra - indicated by diagnosis   Cane/Crutches  []   [x]   []   []   [x]   [x]   [x]   [x]   [x]   [x]   [x]    Walker / Rollator  []  NA   []   [x]   []   []   [x]   [x]   [x]   [x]   [x]   [x]   [x]     Manual Wheelchair [id number]:  []  NA  []   []   []   []   [  x]  [x]   [x]   [x]   [x]   [x]   []    Manual W/C ([id number]) with power assist  []  NA  []   []   []   []   [x]   [x]   [x]   [x]   [x]   [x]   []    Scooter  []  NA  []   []   []   []   [x]   [x]   []   [x]   [x]   [x]   []    Power Wheelchair: standard joystick  []  NA  [x]   []   []   []   []   []   []   []   []   []   []    Power Wheelchair: alternative controls  [x]  NA  []   []   []   []   []   []   []   []   []   []   []    Summary:  The  least costly alternative for independent functional mobility was found to be:    []  Crutch/Cane  []  Walker []  Manual w/c  []  Manual w/c with power assist   []  Scooter   [x]  Power w/c std joystick   []  Power w/c alternative control        []  Requires dependent care mobility device   Cabin crew for [employer] skills are adequate for safe mobility equipment operation  [x]   Yes []   No  Patient is willing and motivated to use recommended mobility equipment  [x]   Yes []   No       []  Patient is unable to safely operate mobility equipment independently and requires dependent care equipment Comments:           SENSATION and SKIN ISSUES:  Sensation []  Intact  [x]  Impaired []  Absent []  Hyposensate []  Hypersensate  []  Defensiveness  Location(s) of impairment: impaired in distal LLE   Pressure Relief Method(s):  []  Lean side to side to offload (without risk of falling)  []   W/C push up (4+ times/hour for 15+ seconds) [x]  Stand up (without risk of falling)    []  Other: (Describe): Effective pressure relief method(s) above can be performed consistently throughout the day: rYes  r No If not, Why?: Patient is unable to perform pressure relief effectively in her current wheelchair due to body habitus limiting her ability to perform and hold a wheelchair push-up and limiting her ability to perform lateral or anterior leans safely or effectively. Additionally, pt is able to stand to a RW to perform pressure relief but due to a wound on the plantar aspect of her L foot it is not safe for her to being standing other than for transfers during the day and she should not be spending an extended period of time standing on this limb.   Skin Integrity Risk:       []  Low risk           []  Moderate risk            [x]  High risk  If high risk, explain: Patient unable to perform pressure relief effectively as noted above. Additionally, pt currently has a wound on the plantar aspect of her  L foot that she is seeing wound care for, also reports skin breakdown on her sacrum, inner thighs, pubic area, and between her skin folds. Pt is at risk from prolonged sitting to develop further pressure injuries.  Skin Issues/Skin Integrity  Current skin Issues  [x]  Yes []  No []  Intact  []   Red area   [x]   Open area  []  Scar tissue  [x]  At  risk from prolonged sitting  Where: plantar aspect of L foot, sacrum History of Skin Issues  [x]  Yes []  No Where : L foot, inner thighs, pubic region, eczema on forearms, skin breakdown in folds When: ongoing Stage: not in chart Hx of skin flap surgeries  []  Yes [x]  No Where:  When:  Pain: [x]  Yes []  No   Pain Location(s): bottom of L foot, pubic area, L knee Intensity scale: (0-10) : 8 How does pain interfere with mobility and/or MRADLs? - unable to tolerate standing for an extended period of time due to pain in her L foot, also should not be WB on LLE other than for transfers per wound care so that her wound can heal properly; patient also has shoulder pain with manual wheelchair propulsion and is unable to propel herself further than about 10 ft with her manual wheelchair; pain and wounds limit patients ability to complete mobility and her MRADLs independently at the manual wheelchair level.        MAT EVALUATION:  Neuro-Muscular Status: (Tone, Reflexive, Responses, etc.)     [x]   Intact   []  Spasticity:  []  Hypotonicity  []  Fluctuating  []  Muscle Spasms  []  Poor Righting Reactions/Poor Equilibrium Reactions  []  Primal Reflex(s):    Comments:            COMMENTS:    POSTURE:     Comments:  Pelvis Anterior/Posterior:  []  Neutral   [x]  Posterior  []  Anterior  []  Fixed - No movement [x]  Tendency away from neutral []  Flexible []  Self-correction []  External correction Obliquity (viewed from front)  []  WFL []  R Obliquity [x]  L Obliquity  []  Fixed - No movement [x]  Tendency away from neutral []  Flexible []   Self-correction []  External correction Rotation  []  WFL []  R anterior [x]  L anterior  []  Fixed - No movement [x]  Tendency away from neutral []  Flexible []  Self-correction []  External correction Tonal Influence Pelvis:  [x]  Normal []  Flaccid []  Low tone []  Spasticity []  Dystonia []  Pelvis thrust []  Other:    Trunk Anterior/Posterior:  []  WFL [x]  Thoracic kyphosis []  Lumbar lordosis  []  Fixed - No movement [x]  Tendency away from neutral []  Flexible []  Self-correction []  External correction  [x]  WFL []  Convex to left  []  Convex to right []  S-curve   []  C-curve []  Multiple curves []  Tendency away from neutral []  Flexible []  Self-correction []  External correction Rotation of shoulders and upper trunk:  []  Neutral [x]  Left-anterior []  Right- anterior []  Fixed- no movement [x]  Tendency away from neutral []  Flexible []  Self correction []  External correction Tonal influence Trunk:  [x]  Normal []  Flaccid []  Low tone []  Spasticity []  Dystonia []  Other:   Head & Neck  [x]  Functional [x]  Flexed    []  Extended []  Rotated right  []  Rotated left []  Laterally flexed right []  Laterally flexed left []  Cervical hyperextension   [x]  Good head control []  Adequate head control []  Limited head control []  Absent head control Describe tone/movement of head and neck: WFL     Lower Extremity Measurements: LE ROM:  Active ROM Right 02/27/2023 Left 02/27/2023  Hip flexion Limited due to body habitus Limited due to body habitus  Hip extension    Hip abduction    Hip adduction    Knee flexion R AKA WFL  Knee extension  HS contracture  Ankle dorsiflexion  Slightly decreased  Ankle plantarflexion     (Blank rows = not tested)  LE MMT:  MMT Right 02/27/2023 Left 02/27/2023  Hip flexion 3 3  Hip extension    Hip abduction    Hip adduction    Knee flexion  3  Knee extension  3  Ankle dorsiflexion  3  Ankle plantarflexion     (Blank rows = not tested)  Hip  positions:  []  Neutral   [x]  Abducted   []  Adducted  []  Subluxed   []  Dislocated   []  Fixed   []  Tendency away from neutral [x]  Flexible []  Self-correction [x]  External correction   Hip Windswept:[]  Neutral  [x]  Right    []  Left  []  Subluxed   []  Dislocated   []  Fixed   [x]  Tendency away from neutral []  Flexible []  Self-correction []  External correction  LE Tone: [x]  Normal []  Low tone []  Spasticity []  Flaccid []  Dystonia []  Rocks/Extends at hip []  Thrust into knee extension []  Pushes legs downward into footrest  Foot positioning: ROM Concerns: Dorsiflexed: []  Right   []  Left Plantar flexed: []  Right    [x]  Left Inversion: []  Right    []  Left Eversion: []  Right    []  Left  LE Edema: []  1+ (Barely detectable impression when finger is pressed into skin) []  2+ (slight indentation. 15 seconds to rebound) [x]  3+ (deeper indentation. 30 seconds to rebound) []  4+ (>30 seconds to rebound)  UE Measurements:  UPPER EXTREMITY ROM:   Active ROM Right 02/27/2023 Left 02/27/2023  Shoulder flexion Limited to 100 degrees Limited to 100 degrees  Shoulder abduction Limited to 100 degrees Limited to 100 degrees  Shoulder adduction    Elbow flexion [facility] WFL  Elbow extension [facility]  Wrist flexion    Wrist extension    (Blank rows = not tested)  UPPER EXTREMITY MMT:  MMT Right 02/27/2023 Left 02/27/2023  Shoulder flexion 3 3  Shoulder abduction 3 3  Shoulder adduction    Elbow flexion 4 4  Elbow extension 4 4  Wrist flexion    Wrist extension    Pinch strength    Grip strength decreased decreased  (Blank rows = not tested)  Shoulder Posture:  Right Tendency towards Left  []   Functional []    []   Elevation []    [x]   Depression [x]    [x]   Protraction [x]    []   Retraction []    []   Internal rotation []    []   External rotation []    []   Subluxed []     UE Tone: [x]  Normal []  Flaccid []  Low tone []  Spasticity  []  Dystonia []  Other:   UE Edema: []  1+ (Barely  detectable impression when finger is pressed into skin) []  2+ (slight indentation. 15 seconds to rebound) []  3+ (deeper indentation. 30 seconds to rebound) []  4+ (>30 seconds to rebound)  Wrist/Hand: Handedness: [x]  Right   []  Left   []  NA: Comments:  Right  Left  [x]   WNL [x]    []   Limitations []    []   Contractures []    []   Fisting []    []   Tremors []    []   Weak grasp []    []   Poor dexterity []    []   Hand movement non functional []    []   Paralysis []         MOBILITY BASE RECOMMENDATIONS and JUSTIFICATION:  MOBILITY BASE  JUSTIFICATION   Manufacturer:   Quantum Model:   [id number]  Color: black Seat Width:  20" Seat Depth: 16"   []  Manual mobility base (continue below)   []  Scooter/POV  [x]  Power mobility base   Number of hours per day spent in above selected mobility base: 16 hours  Typical daily mobility base use Schedule: Patient will utilize her power wheelchair to perform all of her functional mobility in the home. It will allow her to travel between rooms of her home safely and efficiently in order to complete all of her MRADLs. Currently Mrs. Lun is bed-bound as she is unable to maneuver her manual wheelchair safely and efficiently in the home and is unable to perform adequate pressure relief while seated in her manual wheelchair. 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 Due to having impaired endurance and shoulder pain she is also unable to safely and efficiently propel herself in a manual wheelchair.    [x]  is not a safe, functional ambulator  []  limitation prevents from completing a MRADL(s) within a reasonable time frame    []  limitation places at high risk of morbidity or mortality secondary to  the attempts to perform a    MRADL(s)   [x]  limitation prevents accomplishing a MRADL(s) entirely  [x]  provide independent mobility  [x]  equipment is a lifetime medical need  [x]  walker or cane inadequate  [x]  any type manual wheelchair      inadequate  [x]  scooter/POV inadequate      []  requires dependent mobility           POWER MOBILITY      []  Scooter/POV    []  can safely operate   []  can safely transfer   []  has adequate trunk stability   []  cannot functionally propel  manual wheelchair    [x]  Power mobility base    [x]  non-ambulatory   [x]  cannot functionally propel manual wheelchair   [x]  cannot functionally and safely      operate scooter/POV  [x]  can safely operate power       wheelchair  [x]  home is accessible  [x]  willing to use power wheelchair     Tilt  [x]  Powered tilt on powered chair  []  Powered tilt on manual chair  []  Manual tilt on manual chair Comments:  [x]  change position for pressure      [x]  elief/cannot weight shift   []  change position against      gravitational force on head and      shoulders   [x]  decrease pain  []  blood pressure management   []  control autonomic dysreflexia  []  decrease respiratory distress  []  management of spasticity  []  management of low tone  []  facilitate postural control   [x]  rest periods   [x]  control edema  [x]  increase sitting tolerance   [x]  aid with transfers     Recline   []  Power recline on power chair  []  Manual recline on manual chair  Comments:    []  intermittent catheterization  []  manage spasticity  []  accommodate femur to back angle  []  change position for pressure relief/cannot weight shift rhigh risk of pressure sore development  []  tilt alone does not accomplish     effective pressure relief, maximum pressure relief achieved at -  _______ degrees tilt   _______ degrees recline   []  difficult to transfer to and from bed []  rest periods and sleeping in chair  []  repositioning for transfers  []  bring to full recline for ADL  care  []  clothing/diaper changes in chair  []  gravity PEG tube feeding  []  head positioning  []  decrease pain  []  blood pressure management   []  control autonomic dysreflexia  []  decrease respiratory distress  []  user on ventilator     Elevator on mobility base  []  Power wheelchair  []  Scooter  []  increase Indep in transfers   []  increase Indep in ADLs    []  bathroom function and safety  []  kitchen/cooking function and safety  []  shopping  []  raise height for communication at standing level  []  raise height for eye contact which reduces cervical neck strain and pain  []  drive at raised height for safety and navigating crowds  []  Other:   []  Vertical position system  (anterior tilt)     (Drive locks-out)    []  Stand       (Drive enabled)  []  independent weight bearing  []  decrease joint contractures  []  decrease/manage spasticity  []  decrease/manage spasms  []  pressure distribution away from   scapula, sacrum, coccyx, and ischial tuberosity  []  increase digestion and elimination   []  access to counters and cabinets  []  increase reach  []  increase interaction with others at eye level, reduces neck strain  []  increase performance of       MRADL(s)      Power elevating legrest    [x]  Center mount (Single) 85-170 degrees       []  Standard (Pair) 100-170 degrees  [x]  position legs at 90 degrees, not available with std power ELR  [x]  center mount tucks into chair to decrease turning radius in home, not available with std power ELR  [x]  provide change in position for LE  [x]  elevate legs during recline    [x]  maintain placement of feet on      footplate  [x]  decrease edema  [x]  improve circulation  [x]  actuator needed to elevate legrest  [x]  actuator needed to articulate legrest preventing knees from flexing  [x]  Increase ground clearance over      curbs  []   STD (pair) independently                     elevate legrest   POWER WHEELCHAIR CONTROLS      Controls/input  device  []  Expandable  [x]  Non-expandable  [x]  Proportional  [x]  Right Hand []  Left Hand  []  Non-proportional/switches/head-array  []  Electrical/proximity         []   Mechanical      Manufacturer:___Quantum________   Type:_______joystick____________ [x]  provides access for controlling wheelchair  [x]  programming for accurate control  []  progressive disease/changing condition  []  required for alternative drive      controls       []  lacks motor control to operate  proportional drive control  []  unable to understand proportional controls  []  limited movement/strength  []  extraneous movement / tremors / ataxic / spastic       [x]  Upgraded electronics controller/harness    []  Single power (tilt or recline)   []  Expandable    [x]  Non-expandable plus   [x]  Multi-power (tilt, recline, power legrest, power seat lift, vertical positioning system, stand)  [x]  allows input device to communicate with drive motors  [x]  harness provides necessary  connections between the controller, input device, and seat functions     [x]  needed in order to operate power seat functions through joystick/ input device  []  required for alternative drive controls     []  Enhanced display  []  required to connect all alternative drive controls   []  required for upgraded joystick      (lite-throw, heavy duty, micro)  []  Allows user to see in which mode and drive the wheelchair is set; necessary for alternate controls       []  Upgraded tracking electronics  []  correct tracking when on uneven surfaces makes switch driving more efficient and less fatiguing  []  increase safety when driving  []  increase ability to traverse thresholds    []  Safety / reset / mode switches     Type:    []  Used to change modes and stop the wheelchair when driving     [x]  Mount for joystick / input device/switches  [x]  swing away for access or transfers   [x]  attaches joystick / input device / switches to wheelchair   [x]  provides for  consistent access  []  midline for optimal placement    []  Attendant controlled joystick plus     mount  []  safety  []  long distance driving  []  operation of seat functions  []  compliance with transportation regulations    [x]  Battery  [x]  required to power (power assist / scooter/ power wc / other): NF 22 x 2  []  Power inverter (24V to 12V)  []  required for ventilator / respiratory equipment / other:     CHAIR OPTIONS MANUAL & POWER      Armrests   [x]  adjustable height []  removable  []  swing away []  fixed  [x]  flip back  []  reclining  [x]  full length pads []  desk []  tube arms []  gel pads  [x]  provide support with elbow at 90    [x]  remove/flip back/swing away for  transfers  [x]  provide support and positioning of upper body    []  allow to come closer to table top  [x]  remove for access to tables  []  provide support for w/c tray  [x]  change of height/angles for       variable activities   []  Elbow support / Elbow stop  []  keep elbow positioned on arm pad  []  keep arms from falling off arm pad  during tilt and/or recline   Upper Extremity Support  []  Arm trough  []   R  []   L  Style:  []  swivel mount []  fixed mount   []  posterior hand support  []   tray  []  full tray  []  joystick cut out  []   R  []   L  Style:  []  decrease gravitational pull on      shoulders  []  provide support to increase UE  function  []  provide hand support in natural    position  []  position flaccid UE  []  decrease subluxation    []  decrease edema       []  manage spasticity   []  provide midline positioning  []  provide work surface  []  placement for AAC/ Computer/ EADL       Hangers/ Legrests   []  ______ degree  []  Elevating []  articulating  []  swing away []  fixed []  lift off  []  heavy duty []  adjustable knee angle  []  adjustable calf panel   []  longer extension tube              []   provide LE support  []  maintain placement of feet on      footplate   []  accommodate lower leg length  []  accommodate  to hamstring       tightness  []  enable transfers  []  provide change in position for LE's  []  elevate legs during recline    []  decrease edema  []  durability      Foot support   [x]  footplate []  R []  L [x]  flip up           [x]  Depth adjustable   [x]  angle adjustable  []  foot board/one piece    [x]  provide foot support  [x]  accommodate to ankle ROM  [x]  allow foot to go under wheelchair base  [x]  enable transfers     []  Shoe holders  []  position foot    []  decrease / manage spasticity  []  control position of LE  []  stability    []  safety     []  Ankle strap/heel      loops  []  support foot on foot support  []  decrease extraneous movement  []  provide input to heel   []  protect foot     []  Amputee adapter []  R  []  L     Style:                  Size:  []  Provide support for stump/residual extremity    []  Transportation tie-down  []  to provide crash tested tie-down brackets    []  Crutch/cane holder    []  O2 holder    []  IV hanger   []  Ventilator tray/mount    []  stabilize accessory on wheelchair       Component  Justification     [x]  Seat cushion     Solution Skin Protection and Positioning [x]  accommodate impaired sensation  [x]  decubitus ulcers present or history  [x]  unable to shift weight  [x]  increase pressure distribution  []  prevent pelvic extension  [x]  custom required "off-the-shelf"    seat cushion will not accommodate deformity  [x]  stabilize/promote pelvis alignment  [x]  stabilize/promote femur alignment  [x]  accommodate obliquity  []  accommodate multiple deformity  []  incontinent/accidents  [x]  low maintenance     []  seat mounts                 []  fixed []  removable  []  attach seat platform/cushion to wheelchair frame    []  Seat wedge    []  provide increased aggressiveness of seat shape to decrease sliding  down in the seat  []  accommodate ROM        []  Cover replacement   []  protect back or seat cushion  []  incontinent/accidents    []  Solid seat / insert     []  support cushion to prevent      hammocking  []  allows attachment of cushion to mobility base    [x]  Lateral pelvic/thigh/hip     support (Guides)     [x]  decrease abduction  []  accommodate pelvis  [x]  position upper legs  []  accommodate spasticity  [x]  removable for transfers     [x]  Lateral pelvic/thigh      supports mounts  []  fixed   []  swing-away   [x]  removable  [x]  mounts lateral pelvic/thigh supports     [x]  mounts lateral pelvic/thigh supports swing-away or removable for transfers    []  Medial thigh support (Pommel)  [] decrease adduction  [] accommodate ROM  []  remove for transfers   []  alignment      []   Medial thigh   []  fixed      support mounts      []  swing-away   []  removable  []  mounts medial thigh supports   []  Mounts medial supports swing- away or removable for transfers       Component  Justification   [x]  Back   Synergy      [x]  provide posterior trunk support []  facilitate tone  [x]  provide lumbar/sacral support []  accommodate deformity  [x]  support trunk in midline   [x]  custom required "off-the-shelf" back support will not accommodate deformity   []  provide lateral trunk support []  accommodate or decrease tone            []  Back mounts  []  fixed  []  removable  []  attach back rest/cushion to wheelchair frame   []  Lateral trunk      supports  []  R []  L  []  decrease lateral trunk leaning  []  accommodate asymmetry    []  contour for increased contact  []  safety    []  control of tone    []  Lateral trunk      supports mounts  []  fixed  []  swing-away   []  removable  []  mounts lateral trunk supports     []  Mounts lateral trunk supports swing-away or removable for transfers   []  Anterior chest      strap, vest     []  decrease forward movement of shoulder  []  decrease forward movement of trunk  []  safety/stability  []  added abdominal support  []  trunk alignment  []  assistance with shoulder control   []  decrease shoulder elevation    [x]  Headrest      [x]   provide posterior head support  []  provide posterior neck support  []  provide lateral head support  []  provide anterior head support  [x]  support during tilt and recline  []  improve feeding     []  improve respiration  []  placement of switches  [x]  safety    []  accommodate ROM   []  accommodate tone  []  improve visual orientation   [x]  Headrest           []  fixed [x]  removable []  flip down      Mounting hardware   []  swing-away laterals/switches  [x]  mount headrest   [x]  mounts headrest flip down or  removable for transfers  []  mount headrest swing-away laterals   []  mount switches     []  Neck Support    []  decrease neck rotation  []  decrease forward neck flexion   Pelvic Positioner    [x]  std hip belt          []  padded hip belt  []  dual pull hip belt  []  four point hip belt  []  stabilize tone  [x]  decrease falling out of chair  []  prevent excessive extension  []  special pull angle to control      rotation  [x]  pad for protection over boney   prominence  [x]  promote comfort    []  Essential needs        bag/pouch   []  medicines []  special food rorthotics []  clothing changes  []  diapers  []  catheter/hygiene []  ostomy supplies   The above equipment has a life- long use expectancy.  Growth and changes in medical and/or functional conditions would be the exceptions.   SUMMARY:  Why mobility device was selected; include why a lower level device is not appropriate:   Patient requires use of a power wheelchair in order to  perform safe and independent mobility in her home and in order to perform her MRADLs in a timely manner. Due to her impaired endurance, history of a R AKA and L foot ulcer, body habitus, and chronic shoulder pain she is unable to safely and efficiently propel himself in a manual wheelchair without putting significant strain on her shoulder joints and putting herself a risk for injury. Additionally, due to her history of R AKA and L foot ulcer she is currently only able to  stand to transfer but is unable to stand long enough to perform adequate pressure relief and unable to ambulate safely. She requires use of a power wheelchair with tilt so that she can perform mobility safety and independently in her home as well as perform adequate pressure relief independently.  ASSESSMENT:  CLINICAL IMPRESSION: Patient is a 62 y.o. female who was seen today for physical therapy evaluation and treatment for a new power wheelchair.    OBJECTIVE IMPAIRMENTS cardiopulmonary status limiting activity, decreased activity tolerance, decreased balance, decreased endurance, decreased mobility, difficulty walking, decreased ROM, decreased strength, hypomobility, increased edema, increased fascial restrictions, impaired flexibility, impaired sensation, impaired UE functional use, improper body mechanics, postural dysfunction, obesity, and pain.   ACTIVITY LIMITATIONS carrying, lifting, bending, sitting, standing, squatting, stairs, transfers, bed mobility, bathing, toileting, dressing, hygiene/grooming, and locomotion level  PARTICIPATION LIMITATIONS: meal prep, cleaning, laundry, interpersonal relationship, shopping, and community activity  PERSONAL FACTORS Fitness, Time since onset of injury/illness/exacerbation, Transportation, and 3+ comorbidities: PVD, CHF, obesity, anemia, diabetic foot ulcer (L), HLD, mixed connective tissue disease, hypothyroidism, asthma, HTN, GERD, degenerative arthritis of lumbar spine, lumbar radiculopathy, DM II, eczema, hyperbilirubinemia, hyponatremia, Sjogren's syndrome, BLE edema, R AKA  are also affecting patient's functional outcome.   CLINICAL DECISION MAKING: Stable/uncomplicated  EVALUATION COMPLEXITY: High                                   GOALS: One time visit. No goals established.    PLAN: PT FREQUENCY: one time visit    Peter Congo, PT Peter Congo, PT, DPT, CSRS  02/27/2023, 3:15 PM    I concur with the above  findings and recommendations of the therapist:  Physician name printed:         Physician's signature:      Date:

## 2023-03-05 ENCOUNTER — Other Ambulatory Visit: Payer: Self-pay | Admitting: Physical Medicine and Rehabilitation

## 2023-03-06 ENCOUNTER — Encounter (HOSPITAL_BASED_OUTPATIENT_CLINIC_OR_DEPARTMENT_OTHER): Payer: Commercial Managed Care - HMO | Admitting: General Surgery

## 2023-03-13 ENCOUNTER — Encounter (HOSPITAL_BASED_OUTPATIENT_CLINIC_OR_DEPARTMENT_OTHER): Payer: Commercial Managed Care - HMO | Attending: General Surgery | Admitting: General Surgery

## 2023-03-13 DIAGNOSIS — E1140 Type 2 diabetes mellitus with diabetic neuropathy, unspecified: Secondary | ICD-10-CM | POA: Insufficient documentation

## 2023-03-13 DIAGNOSIS — L98492 Non-pressure chronic ulcer of skin of other sites with fat layer exposed: Secondary | ICD-10-CM | POA: Diagnosis not present

## 2023-03-13 DIAGNOSIS — Z6841 Body Mass Index (BMI) 40.0 and over, adult: Secondary | ICD-10-CM | POA: Insufficient documentation

## 2023-03-13 DIAGNOSIS — E1151 Type 2 diabetes mellitus with diabetic peripheral angiopathy without gangrene: Secondary | ICD-10-CM | POA: Insufficient documentation

## 2023-03-13 DIAGNOSIS — I5032 Chronic diastolic (congestive) heart failure: Secondary | ICD-10-CM | POA: Diagnosis not present

## 2023-03-13 DIAGNOSIS — I509 Heart failure, unspecified: Secondary | ICD-10-CM | POA: Insufficient documentation

## 2023-03-13 DIAGNOSIS — Z89511 Acquired absence of right leg below knee: Secondary | ICD-10-CM | POA: Diagnosis not present

## 2023-03-13 DIAGNOSIS — I110 Hypertensive heart disease with heart failure: Secondary | ICD-10-CM | POA: Diagnosis not present

## 2023-03-13 DIAGNOSIS — M3500 Sicca syndrome, unspecified: Secondary | ICD-10-CM | POA: Diagnosis not present

## 2023-03-13 DIAGNOSIS — L97423 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: Secondary | ICD-10-CM | POA: Insufficient documentation

## 2023-03-13 DIAGNOSIS — Z87891 Personal history of nicotine dependence: Secondary | ICD-10-CM | POA: Insufficient documentation

## 2023-03-13 DIAGNOSIS — M069 Rheumatoid arthritis, unspecified: Secondary | ICD-10-CM | POA: Diagnosis not present

## 2023-03-13 DIAGNOSIS — L304 Erythema intertrigo: Secondary | ICD-10-CM | POA: Diagnosis not present

## 2023-03-13 DIAGNOSIS — Z89611 Acquired absence of right leg above knee: Secondary | ICD-10-CM | POA: Diagnosis not present

## 2023-03-13 DIAGNOSIS — E11621 Type 2 diabetes mellitus with foot ulcer: Secondary | ICD-10-CM | POA: Insufficient documentation

## 2023-03-13 DIAGNOSIS — L97822 Non-pressure chronic ulcer of other part of left lower leg with fat layer exposed: Secondary | ICD-10-CM | POA: Diagnosis not present

## 2023-03-13 DIAGNOSIS — L97112 Non-pressure chronic ulcer of right thigh with fat layer exposed: Secondary | ICD-10-CM | POA: Insufficient documentation

## 2023-03-13 DIAGNOSIS — M869 Osteomyelitis, unspecified: Secondary | ICD-10-CM | POA: Insufficient documentation

## 2023-03-13 NOTE — Progress Notes (Signed)
Cobin, Maliha M ([id number]) [id number] Page 1 of 23 Visit Report for 03/13/2023 Chief Complaint Document Details Patient Name: Date of Service: [name], [name] 03/13/2023 10:15 A M Medical Record Number: [id number] Patient Account Number: [id number] Date of Birth/Sex: Treating RN: 04/09/1961 (62 y.o. F) Primary Care Provider: [name] Other Clinician: Referring Provider: Treating Provider/Extender: Vivien Rossetti in Treatment: 33 Information Obtained from: Patient Chief Complaint 10/17/2021; multiple scattered wounds to the abdomen, posterior left leg, left labia and sacrum 07/19/2022: left foot DFU Electronic Signature(s) Signed: 03/13/2023 11:47:40 AM By: Duanne Guess MD FACS Entered By: Duanne Guess on 03/13/2023 11:47:40 -------------------------------------------------------------------------------- Debridement Details Patient Name: Date of Service: [name], [name] 03/13/2023 10:15 A M Medical Record Number: [id number] Patient Account Number: [id number] Date of Birth/Sex: Treating RN: 1960/10/04 (62 y.o. [name] Primary Care Provider: [name] Other Clinician: Referring Provider: Treating Provider/Extender: Vivien Rossetti in Treatment: 33 Debridement Performed for Assessment: Wound #19 Left,Plantar Foot Performed By: Physician Duanne Guess, MD Debridement Type: Debridement Severity of Tissue Pre Debridement: Fat layer exposed Level of Consciousness (Pre-procedure): Awake and Alert Pre-procedure Verification/Time Out Yes - 11:30 Taken: Start Time: 11:30 Pain Control: Lidocaine 4% T opical Solution Percent of Wound Bed Debrided: 100% T Area Debrided (cm): otal 26.77 Tissue and other material debrided: Non-Viable, Slough, Slough Level: Non-Viable Tissue Debridement Description: Selective/Open Wound Instrument: Curette Bleeding: Minimum Hemostasis  Achieved: Pressure End Time: 11:35 Procedural Pain: 0 Post Procedural Pain: 0 Response to Treatment: Procedure was tolerated well Level of Consciousness (Post- Awake and Alert procedure): Post Debridement Measurements of Total Wound Length: (cm) 11 Width: (cm) 3.1 Depth: (cm) 0.2 Volume: (cm) 5.356 Cogle, Jamillia M ([id number]) [id number] Page 2 of 23 Character of Wound/Ulcer Post Debridement: Improved Severity of Tissue Post Debridement: Fat layer exposed Post Procedure Diagnosis Same as Pre-procedure Notes Scribed for Dr. Lady Gary by J.Scotton Electronic Signature(s) Signed: 03/13/2023 12:28:07 PM By: Duanne Guess MD FACS Signed: 03/13/2023 5:05:24 PM By: Karie Schwalbe RN Entered By: Karie Schwalbe on 03/13/2023 11:36:55 -------------------------------------------------------------------------------- Debridement Details Patient Name: Date of Service: [name], [name] 03/13/2023 10:15 A M Medical Record Number: [id number] Patient Account Number: [id number] Date of Birth/Sex: Treating RN: 10/09/1960 (62 y.o. 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 Performed By: Physician Duanne Guess, MD Debridement Type: Debridement Level of Consciousness (Pre-procedure): Awake and Alert Pre-procedure Verification/Time Out Yes - 11:30 Taken: Start Time: 11:30 Pain Control: Lidocaine 4% T opical Solution Percent of Wound Bed Debrided: 100% T Area Debrided (cm): otal 7.3 Tissue and other material debrided: Non-Viable, Slough, Slough Level: Non-Viable Tissue Debridement Description: Selective/Open Wound Instrument: Curette Bleeding: Minimum Hemostasis Achieved: Pressure End Time: 11:35 Procedural Pain: 0 Post Procedural Pain:  0 Response to Treatment: Procedure was tolerated well Level of Consciousness (Post- Awake and Alert procedure): Post Debridement Measurements of Total Wound Length: (cm) 3.1 Stage: Category/Stage II Width: (cm) 3 Depth: (cm) 0.1 Volume: (cm) 0.73 Character of Wound/Ulcer Post Debridement: Improved Post Procedure Diagnosis Same as Pre-procedure Notes Scribed for Dr. Lady Gary by J.Scotton Electronic Signature(s) Signed: 03/13/2023 12:28:07 PM By: Duanne Guess MD FACS Signed: 03/13/2023 5:05:24 PM By: Karie Schwalbe RN Entered By: Karie Schwalbe on 03/13/2023 11:37:55 Demont, Arabella M ([id number]) [id number] Page 3 of 23 -------------------------------------------------------------------------------- Debridement Details Patient Name: Date of Service: [name], [name] 03/13/2023 10:15 A  M Medical Record Number: [id number] Patient Account Number: [id number] Date of Birth/Sex: Treating RN: July 03, 1961 (63 y.o. [name] Primary Care Provider: [name] Other Clinician: Referring Provider: Treating Provider/Extender: Vivien Rossetti in Treatment: 33 Debridement Performed for Assessment: Wound #21 Medial Pubis Performed By: Physician Duanne Guess, MD Debridement Type: Debridement Level of Consciousness (Pre-procedure): Awake and Alert Pre-procedure Verification/Time Out Yes - 11:30 Taken: Start Time: 11:30 Pain Control: Lidocaine 4% T opical Solution Percent of Wound Bed Debrided: 100% T Area Debrided (cm): otal 94.91 Tissue and other material debrided: Non-Viable, Slough, Slough Level: Non-Viable Tissue Debridement Description: Selective/Open Wound Instrument: Curette Bleeding: Minimum Hemostasis Achieved: Pressure End Time: 11:35 Procedural Pain: 0 Post Procedural Pain: 0 Response to Treatment: Procedure was tolerated well Level of Consciousness (Post- Awake and Alert procedure): Post Debridement  Measurements of Total Wound Length: (cm) 9.3 Stage: Category/Stage II Width: (cm) 13 Depth: (cm) 0.1 Volume: (cm) 9.495 Character of Wound/Ulcer Post Debridement: Improved Post Procedure Diagnosis Same as Pre-procedure Notes Scribed for Dr. Lady Gary by J.Scotton Electronic Signature(s) Signed: 03/13/2023 12:28:07 PM By: Duanne Guess MD FACS Signed: 03/13/2023 5:05:24 PM By: Karie Schwalbe RN Entered By: Karie Schwalbe on 03/13/2023 11:39:29 -------------------------------------------------------------------------------- Debridement Details Patient Name: Date of Service: [name], [name] 03/13/2023 10:15 A M Medical Record Number: [id number] Patient Account Number: [id number] Date of Birth/Sex: Treating RN: 1961/01/13 (62 y.o. [name] Primary Care Provider: [name] Other Clinician: Referring Provider: Treating Provider/Extender: Vivien Rossetti in Treatment: 33 Debridement Performed for Assessment: Wound #22 Left,Medial Upper Leg Performed By: Physician Duanne Guess, MD Debridement Type: Debridement Level of Consciousness (Pre-procedure): Awake and Alert Pre-procedure Verification/Time Out Hamm, Samon M ([id number]) [id number] Page 4 of 23 Pre-procedure Verification/Time Out Yes - 11:30 Taken: Start Time: 11:30 Pain Control: Lidocaine 4% T opical Solution Percent of Wound Bed Debrided: 100% T Area Debrided (cm): otal 65.16 Tissue and other material debrided: Non-Viable, Slough, Slough Level: Non-Viable Tissue Debridement Description: Selective/Open Wound Instrument: Curette Bleeding: Minimum Hemostasis Achieved: Pressure End Time: 11:35 Procedural Pain: 0 Post Procedural Pain: 0 Response to Treatment: Procedure was tolerated well Level of Consciousness (Post- Awake and Alert procedure): Post Debridement Measurements of Total Wound Length: (cm) 10 Stage: Category/Stage II Width: (cm)  8.3 Depth: (cm) 0.1 Volume: (cm) 6.519 Character of Wound/Ulcer Post Debridement: Improved Post Procedure Diagnosis Same as Pre-procedure Notes Scribed for Dr. Lady Gary by J.Scotton Electronic Signature(s) Signed: 03/13/2023 12:28:07 PM By: Duanne Guess MD FACS Signed: 03/13/2023 5:05:24 PM By: Karie Schwalbe RN Entered By: Karie Schwalbe on 03/13/2023 11:41:20 -------------------------------------------------------------------------------- Debridement Details Patient Name: Date of Service: [name], [name] 03/13/2023 10:15 A M Medical Record Number: [id number] Patient Account Number: [id number] Date of Birth/Sex: Treating RN: Jul 24, 1961 (62 y.o. [name] Primary Care Provider: [name] Other Clinician: Referring Provider: Treating Provider/Extender: Vivien Rossetti in Treatment: 33 Debridement Performed for Assessment: Wound #23 Midline Umbilicus Performed By: Physician Duanne Guess, MD Debridement Type: Debridement Level of Consciousness (Pre-procedure): Awake and Alert Pre-procedure Verification/Time Out Yes - 11:30 Taken: Start Time: 11:30 Pain Control: Lidocaine 4% T opical Solution Percent of Wound Bed Debrided: 100% T Area Debrided (cm): otal 8.24 Tissue and other material debrided: Non-Viable, Slough, Slough Level: Non-Viable Tissue Debridement Description: Selective/Open Wound Instrument: Curette Bleeding: Minimum Hemostasis Achieved: Pressure End Time: 11:35 Procedural Pain: 0 Post Procedural Pain: 0 Response to Treatment: Procedure was tolerated well Level of Consciousness (Post- Awake and Alert procedure): Saffran, Nadezhda M ([id number]) [id number] Page  5 of 23 Post Debridement Measurements of Total Wound Length: (cm) 7 Width: (cm) 1.5 Depth: (cm) 0.1 Volume: (cm) 0.825 Character of Wound/Ulcer Post Debridement: Improved Post Procedure Diagnosis Same as  Pre-procedure Notes Scribed for Dr. Lady Gary by J.Scotton Electronic Signature(s) Signed: 03/13/2023 12:28:07 PM By: Duanne Guess MD FACS Signed: 03/13/2023 5:05:24 PM By: Karie Schwalbe RN Entered By: Karie Schwalbe on 03/13/2023 11:42:22 -------------------------------------------------------------------------------- Debridement Details Patient Name: Date of Service: [name], [name] 03/13/2023 10:15 A M Medical Record Number: [id number] Patient Account Number: [id number] Date of Birth/Sex: Treating RN: [birth year] (62 y.o. Woodroe Mode, Randa Evens Primary Care Provider: [name] Other Clinician: Referring Provider: Treating Provider/Extender: Vivien Rossetti in Treatment: 33 Debridement Performed for Assessment: Wound #24 Abdomen - midline Performed By: Physician Duanne Guess, MD Debridement Type: Debridement Level of Consciousness (Pre-procedure): Awake and Alert Pre-procedure Verification/Time Out Yes - 11:30 Taken: Start Time: 11:30 Pain Control: Lidocaine 4% Topical Solution Percent of Wound Bed Debrided: 100% T Area Debrided (cm): otal 11.78 Tissue and other material debrided: Non-Viable, Eschar, Slough, Slough Level: Non-Viable Tissue Debridement Description: Selective/Open Wound Instrument: Curette Bleeding: Minimum Hemostasis Achieved: Pressure End Time: 11:35 Procedural Pain: 0 Post Procedural Pain: 0 Response to Treatment: Procedure was tolerated well Level of Consciousness (Post- Awake and Alert procedure): Post Debridement Measurements of Total Wound Length: (cm) 2.5 Width: (cm) 6 Depth: (cm) 0.1 Volume: (cm) 1.178 Character of Wound/Ulcer Post Debridement: Improved Post Procedure Diagnosis Same as Pre-procedure Notes Scribed for Dr. Lady Gary by J.Scotton Electronic Signature(s) Signed: 03/13/2023 12:28:07 PM By: Duanne Guess MD FACS Signed: 03/13/2023 5:05:24 PM By: Karie Schwalbe RN Sinquefield, Aireanna M ([id number])  [phone].pdf Page 6 of 23 Entered By: Karie Schwalbe on 03/13/2023 11:43:25 -------------------------------------------------------------------------------- Debridement Details Patient Name: Date of Service: [name], [name] 03/13/2023 10:15 A M Medical Record Number: [id number] Patient Account Number: [id number] Date of Birth/Sex: Treating RN: July 11, 1961 (62 y.o. Woodroe Mode, Randa Evens Primary Care Provider: [name] Other Clinician: Referring Provider: Treating Provider/Extender: Vivien Rossetti in Treatment: 33 Debridement Performed for Assessment: Wound #25 Left Abdomen - Lower Quadrant Performed By: Physician Duanne Guess, MD Debridement Type: Debridement Level of Consciousness (Pre-procedure): Awake and Alert Pre-procedure Verification/Time Out Yes - 11:30 Taken: Start Time: 11:30 Pain Control: Lidocaine 4% T opical Solution Percent of Wound Bed Debrided: 100% T Area Debrided (cm): otal 62.8 Tissue and other material debrided: Non-Viable, Slough, Slough Level: Non-Viable Tissue Debridement Description: Selective/Open Wound Instrument: Curette Bleeding: Minimum Hemostasis Achieved: Pressure End Time: 11:35 Procedural Pain: 0 Post Procedural Pain: 0 Response to Treatment: Procedure was tolerated well Level of Consciousness (Post- Awake and Alert procedure): Post Debridement Measurements of Total Wound Length: (cm) 10 Width: (cm) 8 Depth: (cm) 0.1 Volume: (cm) 6.283 Character of Wound/Ulcer Post Debridement: Improved Post Procedure Diagnosis Same as Pre-procedure Notes Scribed for Dr. Lady Gary by J.Scotton Electronic Signature(s) Signed: 03/13/2023 12:28:07 PM By: Duanne Guess MD FACS Signed: 03/13/2023 5:05:24 PM By: Karie Schwalbe RN Entered By: Karie Schwalbe on 03/13/2023 11:44:27 -------------------------------------------------------------------------------- Debridement Details Patient Name:  Date of Service: [name], [name] 03/13/2023 10:15 A M Medical Record Number: [id number] Patient Account Number: [id number] Date of Birth/Sex: Treating RN: Oct 27, 1960 (62 y.o. [name] Primary Care Provider: [name] Other Clinician: Referring Provider: Treating Provider/Extender: Vivien Rossetti in Treatment: 33 Debridement Performed for Assessment: Wound #26 Right Abdomen - Lower Quadrant Kempen, Arie M ([id number]) [id number] Page 7 of 23 Performed By: Physician Duanne Guess, MD Debridement Type: Debridement Level  of Consciousness (Pre-procedure): Awake and Alert Pre-procedure Verification/Time Out Yes - 11:30 Taken: Start Time: 11:30 Pain Control: Lidocaine 4% T opical Solution Percent of Wound Bed Debrided: 100% T Area Debrided (cm): otal 2.36 Tissue and other material debrided: Non-Viable, Slough, Slough Level: Non-Viable Tissue Debridement Description: Selective/Open Wound Instrument: Curette Bleeding: Minimum Hemostasis Achieved: Pressure End Time: 11:35 Procedural Pain: 0 Post Procedural Pain: 0 Response to Treatment: Procedure was tolerated well Level of Consciousness (Post- Awake and Alert procedure): Post Debridement Measurements of Total Wound Length: (cm) 1 Width: (cm) 3 Depth: (cm) 0.1 Volume: (cm) 0.236 Character of Wound/Ulcer Post Debridement: Improved Post Procedure Diagnosis Same as Pre-procedure Notes Scribed for Dr. Lady Gary by J.Scotton Electronic Signature(s) Signed: 03/13/2023 12:28:07 PM By: Duanne Guess MD FACS Signed: 03/13/2023 5:05:24 PM By: Karie Schwalbe RN Entered By: Karie Schwalbe on 03/13/2023 11:45:40 -------------------------------------------------------------------------------- Debridement Details Patient Name: Date of Service: [name], [name] 03/13/2023 10:15 A M Medical Record Number: [id number] Patient Account Number: [id number] Date of Birth/Sex:  Treating RN: 1961-03-18 (62 y.o. [name] Primary Care Provider: [name] Other Clinician: Referring Provider: Treating Provider/Extender: Vivien Rossetti in Treatment: 33 Debridement Performed for Assessment: Wound #27 Left Breast Performed By: Physician Duanne Guess, MD Debridement Type: Debridement Level of Consciousness (Pre-procedure): Awake and Alert Pre-procedure Verification/Time Out Yes - 11:30 Taken: Start Time: 11:30 Pain Control: Lidocaine 4% Topical Solution Percent of Wound Bed Debrided: 100% T Area Debrided (cm): otal 22.8 Tissue and other material debrided: Non-Viable, Eschar, Slough, Slough Level: Non-Viable Tissue Debridement Description: Selective/Open Wound Instrument: Curette Bleeding: Minimum Hemostasis Achieved: Pressure End Time: 11:35 Procedural Pain: 0 Post Procedural Pain: 0 Response to Treatment: Procedure was tolerated well Gerstner, Ayala M ([id number]) [id number] Page 8 of 23 Level of Consciousness (Post- Awake and Alert procedure): Post Debridement Measurements of Total Wound Length: (cm) 8.3 Width: (cm) 3.5 Depth: (cm) 0.1 Volume: (cm) 2.282 Character of Wound/Ulcer Post Debridement: Improved Post Procedure Diagnosis Same as Pre-procedure Notes Scribed for Dr. Lady Gary by J.Scotton Electronic Signature(s) Signed: 03/13/2023 12:28:07 PM By: Duanne Guess MD FACS Signed: 03/13/2023 5:05:24 PM By: Karie Schwalbe RN Entered By: Karie Schwalbe on 03/13/2023 11:47:10 -------------------------------------------------------------------------------- HPI Details Patient Name: Date of Service: [name], [name] 03/13/2023 10:15 A M Medical Record Number: [id number] Patient Account Number: [id number] Date of Birth/Sex: Treating RN: 17-Apr-1961 (62 y.o. F) Primary Care Provider: [name] Other Clinician: Referring Provider: Treating Provider/Extender: Vivien Rossetti in Treatment: 33 History of Present Illness HPI Description: 07/23/2019 on evaluation today patient presents with a myriad of wounds noted at multiple locations over her right heel, right lower extremity, and abdominal region. She also has bilateral lower extremity lymphedema which is quite significant as well. Incidentally she also has congestive heart failure and hypertension. She also is obese. With that being said I think all this is contributing as well to her lower extremity edema which is very much uncontrolled. It seems like its been at least several months since she is worn any compression according to what she tells me. With that being said I am not sure exactly when that would have been. She does have fibrotic changes in the lower extremity secondary to lymphedema worse on the left than the right. She did show me pictures of wound she had on the anterior portion of her shin which was quite significant fortunately that has healed. These issues have been intermittent at this time. Again I think that with appropriate compression therapy she may  actually be doing better than what we are seeing at this point but again I am not really sure that she is ever been extremely compliant with that. Fortunately there is no signs of active infection at this time. No fever chills noted. As far as the abdominal ulcer she initially had one wound that she thinks may have been a bug bite. Subsequently she was put a dressing on this and she states that the tape pulled skin off on other locations causing other wounds that have not healed that is been about 3 months. The wounds on her lower extremities have been intermittent over 3 years. This includes the heel. 11/19; this is a patient that I have not seen previously. She was admitted to our clinic last week with multiple wounds including several superficial circular areas on her abdomen predominantly right upper quadrant. Also  1 in her umbilicus. She has an area on her right lateral malleolus which was new today. She has bilateral lower extremity edema with very significant stasis dermatitis on the left anterior tibial area. She has tightly adherent skin in her lower extremities probably secondary to cutaneous fibrosis in the area. We put her in compression last week. She comes in with the dressings reasonably saturated. 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 She has multiple areas on her arms from scratching. 12/3; the patient I saw for the first time 2 weeks ago. She has 3 small circular areas on her abdomen 2 in the right upper quadrant one on her umbilicus. We have been using silver alginate to this area. She also had an area on her right lateral malleolus and right heel and right medial malleolus. We have been using silver alginate here under compression. She does not have an open area on the left leg today. We are going to order her stockings for the left leg. She clearly has chronic lymphedema in these areas. X-ray of the foot from 10/20 did not show any fracture or dislocation. The heel wound was noted there was no evidence of osteomyelitis She complains of generalized pruritus. 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 She has longstanding lymphedema. Although she does not have known liver disease I looked at his CT scan of the abdomen from February of this year that showed hepatic steatosis 12/11; patient still has no open area on the left leg we transitioned her into a stocking she had. Her stockings from [employer] are supposed to be arriving later today which will be the stockings of choice. The only wound remaining on the right is the right heel 2 small superficial open areas.  Everything else is well on its way to healing in the right leg few small excoriations. She has 1 major area remaining on her abdomen the rest seem to be healing 12/22 patient still has no open area on the left leg and she is still in a stocking. She had still 2 open areas on the tip of her right heel. These look like pressure related areas although the patient is not really certain how this is happening. She has an open heeled shoe that we provided. Still has 1 open area on the right lateral abdomen The patient has complaints of generalized pruritus. She has multiple excoriated areas on her abdomen that of close over her arms or thighs  which I think are from scratching. She tells me that she has never had a basic work-up for this which might include basic lab work, liver functions, kidney functions, thyroid etc. She might also benefit from a dermatologist 12/29; the patient has a deeper larger more painful wound on the tip of her right heel. Again these look like pressure ulcers although she wears an open toed heel pads or heel at night to prevent it from hitting the mattress etc. They tell me and remind me that this is been present on and off for about 2 years that has closed over but then will reopen. Hounshell, Krysten M ([id number]) [id number] Page 9 of 23 I did look over her lab work that was available in [facility] link. She does not have elevated liver function tests or creatinine. I was not able to see a TSH. This was in response to her complaints of generalized itching and a itch/scratch cycle. I also noted that she is on OxyContin and oxycodone and of course narcotics can cause generalized pruritus as a side effect Readmission: History From [place], [place] Last Week: 03/29/2021 this is a patient who presents for initial evaluation here in the clinic though I have seen her 1 time previously in 2020 this was in [place]. That was actually her initial visit therefore  a heel ulceration. Subsequently that did not healing she actually saw me the 1 time in November and then subsequently saw Dr. Leanord Hawking through the end of December where she apparently was healed. 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 This is on the right. With that being said right now she has a basically region on the lateral portion of the ray site where she is applying more pressure especially as her foot seems to be starting to turn in and this has become an increasingly significant issue for her to be honest. She does see Dr. Lajoyce Corners and Dr. Lajoyce Corners has had her on doxycycline for quite a bit of time here. Subsequently she is just now wrapping that out. I think that she probably would be benefited by continue the doxycycline for a time while we can work through what we need to do with things here. She does have evidence of osteomyelitis based on what I saw on the MRI today. This obviously is unfortunate and definitely not something that she was hoping to hear. With that being said I do believe that she would potentially be a strong candidate for hyperbaric oxygen therapy. Also believe she could be a candidate for total contact cast though we have to be cautious due to the fact that how her foot is bending inward I think that this is good to be a little bit of a concern. We will definitely have to keeping a close eye on things. She does have a history of diabetes mellitus type 2. She also other than the amputation has a medical history positive for hypertension. She has never undergone hyperbaric oxygen therapy previously I did show her the chamber we did talk a little bit about it today as well. Admission T [facility]: o 04/06/2021 Patient presents here in [place] today for evaluation. Again she is establishing care here after I saw her last week in [place]. Obviously I think that  she is doing extremely well at this point which is great news and in general I am extremely pleased with  where things stand from the standpoint of the appearance of the wound. Obviously this is not significantly smaller but does look a lot cleaner I think using the Hydrofera Blue has been of benefit over the past week. Nonetheless she still has a wound with issues with pressure here which I think is good to be an ongoing issue. Also think that the osteomyelitis which is chronic is also getting ongoing issue. She does want to try to do what she can to prevent this from worsening and ending with a below-knee amputation. T that end I do think that getting her into the hyperbaric oxygen chamber would be what we need to do. She has recently been seen by cardiology o and subsequently well as far as that is concerned. Her ejection fraction was appropriate for hyperbarics and everything seems to be doing great in that regard. She has not had a recent chest x-ray she is a former smoker around 15 years ago she quit. Nonetheless I do believe with her asthma we do want to do a chest x-ray just to make sure everything is okay before proceeding with the hyperbarics although we can go ahead and see about getting the approval. I also think she is going require some sharp debridement and around this wound we also have to contact her insurance for prior approval on this as well. 04/13/2021 upon evaluation today patient appears to be doing a little bit better in regard to her leg in fact the swelling is dramatically better. Very pleased with where things stand in that regard. Fortunately there does not appear to be any signs of active infection at this time. No fevers, chills, nausea, vomiting, or diarrhea. 04/20/2021 upon evaluation today patient appears to be doing decently well in regard to her foot. There is some need for sharp debridement here today. With that being said we will get a go ahead and proceed with that  today as the foot is becoming somewhat macerated with the overhanging callus that is definitely not we want to see. With that being said the patient does have an issue here as well with a boil in the perineal region unfortunately that is an issue for her today as well. I did have a look at that as well. We are still working on dealing with insurance as far as getting hyperbarics approved. 04/27/2021 upon evaluation today patient appears to be doing somewhat poorly in general compared to where she has been. At this point she is having a lot of swelling which is the main concerning thing that I am seeing. There does not appear to be any signs of infection currently which is good news but at the same time I do feel like that she is a lot more swollen than she was even last week and is showing how much she is weeping in regard to the right lower leg. She also has a blister on the left breast although is not an open wound at this time. She continues to have the area in the perineum as well. 05/04/2021 upon evaluation today patient appears to actually be doing decently well in regard to her wound on the foot. Fortunately there is no signs of active infection at this time. No fevers, chills, nausea, vomiting, or diarrhea. Unfortunately she does have an area on the left breast which is actually appearing to be a burn based on what I see physically. She does note that she uses rice bags for areas in general and may have left  one in that area not realizing it was burning her as she does not really have much feeling. I think this is very probable based on what I am seeing. For that reason working to treat this as such I do think we need to loosen up a lot of the necrotic tissue here. I Am going tosend in a prescription for Santyl for the patient. 9/1; patient presents for HBO today but cannot do treatment due to wheezing and feeling short of breath when laying down. She was set up as a doctor visit today since she  was not able to do HBO. She states she feels okay overall and started having a dry cough over the past couple days. She has not tested herself for COVID. She denies fever/chills or sputum production. She thinks her symptoms are related to allergies. She has been using silver alginate to her right plantar foot wound. She has not been using Santyl to the breast wound because she reports forgetting to do this. She uses zinc oxide to her perennial wound. She currently denies systemic signs of infection. 9/8; patient presents for follow-up. She has been a unable to do HBO treatments for the past week due to her back pain. She is currently taking Flexeril to address this issue. She reports no issues with her compression wrap. She has been putting Santyl on the left breast wound and Monistat cream to the perennial wound. She denies signs of infection. 9/15; patient presents for follow-up. Again she is unable to do HBO treatment today due to sinus pain. She has 2 new wounds to her right foot which she is unaware of. She has been putting Santyl on the left breast wound and zinc oxide to the perineal wound. She currently denies signs of infection. Readmission 10/17/2021 Ms. Adelind Kowall is a 62 year old female with a past medical history of type 2 diabetes, osteomyelitis status post right BKA and morbid obesity that presents to the clinic for multiple scattered open wounds to her body. These are located to the abdomen, left posterior leg and sacral region. She has been using mupirocin ointment, Neosporin and zinc oxide to the wound beds. She has been started on Bactrim and Keflex by her primary care physician and states she has several days left to complete the course. She currently denies systemic signs of infection. READMISSION This is a 62 year old morbidly obese type II diabetic (poorly controlled with last hemoglobin A1c 9.2%). She has congestive heart failure, peripheral vascular disease, hypertension,  coronary artery disease, venous stasis, connective tissue disease (o Sjogren's syndrome), and bilateral lower extremity edema. She has been seen here for various reasons in the past. She has a right above-knee amputation. She was recently hospitalized for a left diabetic foot ulcer. Orthopedic surgery was consulted. An MRI was performed that was not conclusive for osteomyelitis, but given the extent of the necrosis, orthopedics recommended amputation. Infectious disease was also consulted and have recommended a 6-week course of oral doxycycline and Augmentin. Apparently wound care was also consulted while she was in the hospital and simply recommended painting the area with Betadine. She saw infectious disease on October 27 with plans to continue doxycycline on Augmentin to continue therapy until November 14. The infectious disease provider also recommended that the patient reconsider amputation. Her PCP referred her to the wound care center, as the patient is very reluctant to undergo amputation. She also has a small ulcer on her anterior tibial surface that recently opened after a minor trauma. On exam, her  left foot is rolled such that the lateral aspect of her foot is the contact surface. There is a large ulcer with exposed necrotic muscle and fat. The wound does not probe to bone, but apparently there was bone exposure in the past while she was in the hospital. No malodor or purulent drainage. The wound on her anterior tibial surface is small with just a little slough accumulation. She has modest edema and skin changes consistent with stasis dermatitis. 07/28/2022: The anterior tibial wound is healed. The left plantar foot wound looks a little bit better today. There is significantly less necrotic tissue present. There is an area at the most caudal aspect of the wound that looks like there is ongoing pressure-induced tissue injury. Bone remains covered. 08/11/2022: The wound has deteriorated quite a  bit since her last visit. Her husband reports that she has had significantly more drainage, such that he has had Corona, Danesha M ([id number]) [id number] Page 10 of 23 to change the dressing multiple times per day. During the course of the visit, she recalled that she had not been taking her Lasix for at least 4 days. There is substantial periwound maceration and further tissue breakdown. There is necrotic tissue at the midportion of the wound and tendon is now exposed underneath this necrotic muscle. 08/17/2022: The wound looks better this week. There is still an area of necrotic muscle and tendon in the midfoot, but the forefoot has improved with some epithelium beginning to come in from the margins. Unfortunately, there is evidence of ongoing pressure-induced tissue injury near the heel. The patient does admit to resting her heel on a stool frequently. She is still taking Bactrim as prescribed and her Keystone topical antibiotic compound should arrive at their home today. 12/14; fairly large sized wound on the left plantar foot. She has a right AKA. She uses a leg only for transfers she says she does not propel her wheelchair with that foot. She has been using Keystone, silver alginate and an Ace wrap. She has completed her Bactrim and Augmentin. She is a type II diabetic. The most worrisome part of this wound is a smaller area roughly 2 x 2 in the distal part of the wound which is 100% occupied by necrotic tendon 09/07/2022: She had an extensive debridement at her last visit. The wound is smaller today and all of the tissues appear healthier. 09/15/2022: The wound measurements are about the same, but overall the surface appears healthier. There is still a little bit of the nonviable tendon exposed at the midfoot. 10/13/2022: The patient has been ill and unable to attend clinic for almost a month. Today, the wound measures smaller and there are buds of epithelium beginning to  emerge, particularly at the calcaneus. There is a bridge of skin trying to come across the midportion of the wound, as well. Minimal slough accumulation. 10/26/2022: The absolute wound measurements have not changed, but the surface is technically smaller due to a bridge of epithelium that nearly divides the wound in two. There is some eschar at the medial edge of the calcaneal aspect of the wound, underneath which there is good epithelium. There is some slough on the wound surface, predominantly at the heel. 11/14/2022: She has been absent from clinic for couple of weeks due to health issues. There is more epithelium encroaching upon the edges of the wound. She has built up some eschar around the edges. There is thin biofilm and slough on the wound surface. 12/12/2022: The wound is  narrower this week. There is some eschar and macerated callus around the edges with biofilm on the surface. There is a bridge of skin attempting to divide the wound into 2 sections. 12/26/2022: The wound on her plantar left foot is smaller and the bridge of skin between the 2 sections is getting wider. There is a little bit of eschar around the edges and thin slough on the surface. Unfortunately, she has multiple additional wounds that have actually been present for some time but she only just now is bringing them to our attention. She has wounds on her pubis and on the distal portion of her AKA stump as well as a wound on her mid abdomen. The pubis and AKA stump wounds look as though they are secondary to friction or pressure. The wound in her mid abdomen is of unclear etiology. It is quite dry. She has moisture-related tissue breakdown in her intertriginous folds and there is a strong smell of yeast coming from her groin region. 01/10/2023: She feels poorly today and was unable to transfer from her wheelchair to the exam table. The only wounds we were able to assess today were those on her upper abdomen, the one at her umbilicus,  and the plantar left foot ulcer. The abdominal sites show evidence of picking and scratching. There is slough and eschar on all of the surfaces. The plantar ulcer on the left has some biofilm on the surface, but it continues to contract and epithelial bridge is larger. 01/25/2019: Multiple additional wounds were uncovered today. She has a cluster on her left upper abdomen, a larger 1 on her left breast, wounds in her intertriginous folds, additional wounds on her posterior left thigh and in the groin, along with more extensive wounds on her mons. The wound on her plantar foot is looking a little bit better, but does seem a bit dry. 02/08/2023: All of the wounds look a little bit better today. The 2 clusters of abdominal wounds all measured smaller as did the wound on her left breast. The wounds on her mons are clean and per measurements also a little bit smaller. The wound on her left medial ankle has dried up considerably and there are just a couple of the open sites remaining. The wound in her right groin has some slough accumulation as well as some fat necrosis. 03/13/2023: She has new wounds in her left groin that extend into the fat layer with slough accumulation. The foot has deteriorated; her husband says she had a period where there was a lot of drainage that resulted in tissue breakdown. She is clearly having some drainage from her left ankle, as well as there is slough accumulation on a cracked skin surface. She has a new wound on her left breast, just medial to the existing wound. She says she has no idea how it started but that she simply woke up one morning with a wound there and her entire breast reddened. She denies any use of external heat source such as a heating pad. All of the other wounds are about the same with slough accumulation. The new breast wound has some eschar and fibrotic surface. The periumbilical wound also has some eschar accumulation. Electronic Signature(s) Signed:  03/13/2023 11:50:33 AM By: Duanne Guess MD FACS Entered By: Duanne Guess on 03/13/2023 11:50:33 -------------------------------------------------------------------------------- Physical Exam Details Patient Name: Date of Service: [name], [name] 03/13/2023 10:15 A M Medical Record Number: [id number] Patient Account Number: [id number] Date of Birth/Sex: Treating RN: Dec 21, 1960 (61  y.o. F) Primary Care Provider: [name] Other Clinician: Referring Provider: Treating Provider/Extender: Vivien Rossetti in Treatment: 33 Constitutional . . . . no acute distress. Respiratory Normal work of breathing on room air. Dunkel, Anyelin M ([id number]) [id number] Page 11 of 23 Notes 03/13/2023: She has new wounds in her left groin that extend into the fat layer with slough accumulation. The foot has deteriorated. She is clearly having some drainage from her left ankle, as well as there is slough accumulation on a cracked skin surface. She has a new wound on her left breast, just medial to the existing wound. All of the other wounds are about the same with slough accumulation. The new breast wound has some eschar and fibrotic surface. The periumbilical wound also has some eschar accumulation. Electronic Signature(s) Signed: 03/13/2023 11:51:27 AM By: Duanne Guess MD FACS Entered By: Duanne Guess on 03/13/2023 11:51:27 -------------------------------------------------------------------------------- Physician Orders Details Patient Name: Date of Service: Dishman, [name] 03/13/2023 10:15 A M Medical Record Number: [id number] Patient Account Number: [id number] Date of Birth/Sex: Treating RN: [birth year] (62 y.o. [name] Primary Care Provider: [name] Other Clinician: Referring Provider: Treating Provider/Extender: Vivien Rossetti in Treatment: 46 Verbal / Phone Orders: No Diagnosis  Coding Follow-up Appointments ppointment in 2 weeks. - **** EXTRA TIME at least 1.5 hrs COMPLEX patient**** Return A Dr. Lady Gary Room 3 Other: - Pick up lidocaine at Pharmacy Anesthetic (In clinic) Topical Lidocaine 5% applied to wound bed Bathing/ Shower/ Hygiene May shower and wash wound with soap and water. - with dressing changes Edema Control - Lymphedema / SCD / Other Elevate legs to the level of the heart or above for 30 minutes daily and/or when sitting for 3-4 times a day throughout the day. Avoid standing for long periods of time. Patient to wear own compression stockings every day. Off-Loading Other: - Try and elevate Left leg throughout the day Wound Treatment Wound #19 - Foot Wound Laterality: Plantar, Left Cleanser: Soap and Water 1 x Per Day/30 Days Discharge Instructions: May shower and wash wound with dial antibacterial soap and water prior to dressing change. Cleanser: Wound Cleanser (Generic) 1 x Per Day/30 Days Discharge Instructions: Cleanse the wound with wound cleanser prior to applying a clean dressing using gauze sponges, not tissue or cotton balls. Prim Dressing: Maxorb Extra Ag+ Alginate Dressing, 4x4.75 (in/in) (Generic) 1 x Per Day/30 Days ary Discharge Instructions: Apply to wound bed as instructed Secondary Dressing: ABD Pad, 5x9 (Generic) 1 x Per Day/30 Days Discharge Instructions: Apply over primary dressing as directed. Secondary Dressing: Woven Gauze Sponge, Non-Sterile 4x4 in (Generic) 1 x Per Day/30 Days Discharge Instructions: Apply over primary dressing as directed. Secured With: Elastic Bandage 4 inch (ACE bandage) (Generic) 1 x Per Day/30 Days Discharge Instructions: Secure with ACE bandage as directed. Secured With: [employer], 4.5x3.1 (in/yd) (Generic) 1 x Per Day/30 Days Discharge Instructions: Secure with Kerlix as directed. Secured With: 35M Medipore Scientist, research (life sciences) Surgical T 2x10 (in/yd) (Generic) 1 x Per Day/30 Days ape Discharge  Instructions: Secure with tape as directed. Wound #20 - Amputation Site - Above Knee Wound Laterality: Right Prim Dressing: Maxorb Extra Ag+ Alginate Dressing, 4x4.75 (in/in) ary 1 x Per Day/30 Days Curling, Rachell M ([id number]) [id number] Page 12 of 23 Discharge Instructions: Apply to wound bed as instructed Secondary Dressing: ABD Pad, 8x10 1 x Per Day/30 Days Discharge Instructions: Apply over primary dressing as directed. Secured With: 35M Medipore Financial risk analyst Surgical T  ape, 4 x 10 (in/yd) 1 x Per Day/30 Days Discharge Instructions: Secure with tape as directed. Wound #21 - Pubis Wound Laterality: Medial Peri-Wound Care: Zinc Oxide Ointment 30g tube 1 x Per Day/30 Days Discharge Instructions: Apply Zinc Oxide to periwound with each dressing change Secondary Dressing: ABD Pad, 8x10 1 x Per Day/30 Days Discharge Instructions: Apply over primary dressing as directed. Secured With: 77M Medipore H Soft Cloth Surgical T ape, 4 x 10 (in/yd) 1 x Per Day/30 Days Discharge Instructions: Secure with tape as directed. Wound #23 - Umbilicus Wound Laterality: Midline Prim Dressing: Maxorb Extra Ag+ Alginate Dressing, 4x4.75 (in/in) 1 x Per Day/30 Days ary Discharge Instructions: Apply to wound bed as instructed Secondary Dressing: ABD Pad, 8x10 1 x Per Day/30 Days Discharge Instructions: Apply over primary dressing as directed. Secured With: 77M Medipore H Soft Cloth Surgical T ape, 4 x 10 (in/yd) 1 x Per Day/30 Days Discharge Instructions: Secure with tape as directed. Wound #24 - Abdomen - midline Topical: Santyl Collagenase Ointment, 30 (gm), tube 1 x Per Day/30 Days Discharge Instructions: Apply to crusty area Prim Dressing: Maxorb Extra Ag+ Alginate Dressing, 4x4.75 (in/in) 1 x Per Day/30 Days ary Discharge Instructions: Apply to wound bed as instructed Secondary Dressing: ABD Pad, 8x10 1 x Per Day/30 Days Discharge Instructions: Apply over primary dressing as  directed. Secured With: 77M Medipore H Soft Cloth Surgical T ape, 4 x 10 (in/yd) 1 x Per Day/30 Days Discharge Instructions: Secure with tape as directed. Wound #26 - Abdomen - Lower Quadrant Wound Laterality: Right Peri-Wound Care: Nystop Powder (Nystatin) 1 x Per Day/30 Days Discharge Instructions: Apply Nystop (Nystatin) Powder as instructed Peri-Wound Care: Zinc Oxide Ointment 30g tube 1 x Per Day/30 Days Discharge Instructions: Apply Zinc Oxide to periwound with each dressing change Prim Dressing: Maxorb Extra Ag+ Alginate Dressing, 4x4.75 (in/in) 1 x Per Day/30 Days ary Discharge Instructions: Apply to wound bed as instructed Secondary Dressing: ABD Pad, 8x10 1 x Per Day/30 Days Discharge Instructions: Apply over primary dressing as directed. Secured With: 77M Medipore H Soft Cloth Surgical T ape, 4 x 10 (in/yd) 1 x Per Day/30 Days Discharge Instructions: Secure with tape as directed. Wound #27 - Breast Wound Laterality: Left Topical: Santyl Collagenase Ointment, 30 (gm), tube 1 x Per Day/30 Days Discharge Instructions: Apply to the crusty wound on left breast Prim Dressing: Maxorb Extra Ag+ Alginate Dressing, 4x4.75 (in/in) 1 x Per Day/30 Days ary Discharge Instructions: Apply to wound bed as instructed Secondary Dressing: ABD Pad, 8x10 1 x Per Day/30 Days Discharge Instructions: Apply over primary dressing as directed. Secured With: 77M Medipore H Soft Cloth Surgical T ape, 4 x 10 (in/yd) 1 x Per Day/30 Days Discharge Instructions: Secure with tape as directed. Patient Medications llergies: peanut, morphine, gabapentin, iodine, Victoza, Pepto-Bismol, fish oil, Fish Containing Products, Crestor, lovastatin, metronidazole, Red Yeast Rice A Crute, Lashya M ([id number]) [id number] Page 13 of 23 ([place] Purpureus), rotigotine, Atrovent, Suprep Bowel Prep Kit, adhesive tape, Betadine, Dial soap, Suprep Bowel Prep Kit Notifications Medication Indication Start  End 03/13/2023 lidocaine DOSE topical 5 % cream - Use as directed for dressing changes Electronic Signature(s) Signed: 03/13/2023 1:55:18 PM By: Duanne Guess MD FACS Previous Signature: 03/13/2023 12:28:07 PM Version By: Duanne Guess MD FACS Entered By: Duanne Guess on 03/13/2023 13:55:16 -------------------------------------------------------------------------------- Problem List Details Patient Name: Date of Service: Pajak, [name] 03/13/2023 10:15 A M Medical Record Number: [id number] Patient Account Number: [id number] Date of Birth/Sex: Treating RN: May 21, 1961 (61 y.o.  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 07/19/2022 No Yes L30.4 Erythema intertrigo 12/26/2022 No Yes L97.112 Non-pressure chronic ulcer of right thigh with fat layer exposed 12/26/2022 No Yes L98.492 Non-pressure chronic ulcer of skin of other sites with fat layer exposed 12/26/2022 No Yes L97.822 Non-pressure chronic ulcer of other part of left lower leg with fat layer exposed4/16/2024 No Yes M86.9 Osteomyelitis, unspecified 07/19/2022 No Yes E11.621 Type 2 diabetes mellitus with foot ulcer 07/19/2022 No Yes E66.01 Morbid (severe) obesity due to excess calories 07/19/2022 No Yes M35.00 Sjogren syndrome, unspecified 07/19/2022 No Yes I73.9 Peripheral vascular disease, unspecified 07/19/2022 No Yes I50.32 Chronic diastolic (congestive) heart failure 07/19/2022 No Yes Bring, Teliyah M ([id number]) [id number] Page 14 of 23 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 07/19/2022 No Yes Inactive Problems Resolved Problems Electronic Signature(s) Signed: 03/13/2023 11:47:17 AM By: Duanne Guess MD FACS Entered By: Duanne Guess on 03/13/2023 11:47:17 -------------------------------------------------------------------------------- Progress Note Details Patient Name: Date of Service: Karel, [name] 03/13/2023 10:15 A M Medical Record Number: [id number] Patient Account Number: [id number] Date of Birth/Sex: Treating RN: 1960/11/29 (62 y.o. F) Primary Care Provider: [name] Other Clinician: Referring Provider: Treating Provider/Extender: Vivien Rossetti in Treatment: 33 Subjective Chief Complaint Information obtained from Patient 10/17/2021; multiple scattered wounds to the abdomen, posterior left leg, left labia and sacrum 07/19/2022: left foot DFU History of Present Illness (HPI) 07/23/2019 on evaluation today patient presents with a myriad of wounds noted at multiple locations over her right heel, right lower extremity, and abdominal region. She also has bilateral lower extremity lymphedema which is quite significant as well. Incidentally she also has congestive heart failure and hypertension. She also is obese. With that being said I think all this is contributing as well to her lower extremity edema which is very much uncontrolled. It seems like its been at least several months since she is worn any compression according to what she tells me. With that being said I am not sure exactly when that would have been. She does have fibrotic changes in the lower extremity secondary to lymphedema worse on the left than the right. She did show me pictures of wound she had on the anterior portion of her shin which was quite significant fortunately that has healed. These issues have been intermittent at this time. Again I think that with appropriate compression therapy she may actually be doing better than what we are seeing at this point but again I am not really sure that she is ever been extremely compliant with that. Fortunately there is no  signs of active infection at this time. No fever chills noted. As far as the abdominal ulcer she initially had one wound that she thinks may have been a bug bite. Subsequently she was put a dressing on this and she states that the tape pulled skin off on other locations causing other wounds that have not healed that is been about 3 months. The wounds on her lower extremities have been intermittent over 3 years. This includes the heel. 11/19; this is a patient that I have not seen previously. She was admitted to our clinic last week with multiple wounds including several superficial circular areas on her abdomen predominantly right upper quadrant. Also 1 in her umbilicus.  She has an area on her right lateral malleolus which was new today. She has bilateral lower extremity edema with very significant stasis dermatitis on the left anterior tibial area. She has tightly adherent skin in her lower extremities probably secondary to cutaneous fibrosis in the area. We put her in compression last week. She comes in with the dressings reasonably saturated. 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 She has multiple areas on her arms from scratching. 12/3; the patient I saw for the first time 2 weeks ago. She has 3 small circular areas on her abdomen 2 in the right upper quadrant one on her umbilicus. We have been using silver alginate to this area. She also had an area on her right lateral malleolus and right heel and right medial malleolus. We have been using silver alginate here under compression. She does not have an open area on the left leg today. We are going to order her stockings for the left leg. She clearly has chronic lymphedema in these areas. X-ray of the foot from 10/20 did not show any fracture or  dislocation. The heel wound was noted there was no evidence of osteomyelitis She complains of generalized pruritus. 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 She has longstanding lymphedema. Although she does not have known liver disease I looked at his CT scan of the abdomen from February of this year that showed hepatic steatosis 12/11; patient still has no open area on the left leg we transitioned her into a stocking she had. Her stockings from [employer] are supposed to be arriving later today which will be the stockings of choice. The only wound remaining on the right is the right heel 2 small superficial open areas. Everything else is well on its way to healing in the right leg few small excoriations. She has 1 major area remaining on her abdomen the rest seem to be healing 12/22 patient still has no open area on the left leg and she is still in a stocking. She had still 2 open areas on the tip of her right heel. These look like pressure related areas although the patient is not really certain how this is happening. She has an open heeled shoe that we provided. Still has 1 open area on the right lateral abdomen The patient has complaints of generalized pruritus. She has multiple excoriated areas on her abdomen that of close over her arms or thighs which I think are from scratching. She tells me that she has never had a basic work-up for this which might include basic lab work, liver functions, kidney functions, thyroid etc. She might also benefit from a dermatologist 12/29; the patient has a deeper larger more painful wound on the tip of her right heel. Again these look like pressure ulcers although she wears an open toed heel pads or heel at night to prevent it from hitting the mattress etc. They tell me and remind me that this is been present on and off for about 2 years that has closed over but then will reopen. I did look over her lab work that was  available in [employer] [facility] link. She does not have elevated liver function tests or creatinine. I was not able to see a TSH. This was in response to her complaints of generalized itching and a  itch/scratch cycle. I also noted that she is on OxyContin and oxycodone and of course Strawderman, Jobina M ([id number]) [id number] Page 15 of 23 narcotics can cause generalized pruritus as a side effect Readmission: History From [place], [place] Last Week: 03/29/2021 this is a patient who presents for initial evaluation here in the clinic though I have seen her 1 time previously in 2020 this was in [place]. That was actually her initial visit therefore a heel ulceration. Subsequently that did not healing she actually saw me the 1 time in November and then subsequently saw Dr. Leanord Hawking through the end of December where she apparently was healed. 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 This is on the right. With that being said right now she has a basically region on the lateral portion of the ray site where she is applying more pressure especially as her foot seems to be starting to turn in and this has become an increasingly significant issue for her to be honest. She does see Dr. Lajoyce Corners and Dr. Lajoyce Corners has had her on doxycycline for quite a bit of time here. Subsequently she is just now wrapping that out. I think that she probably would be benefited by continue the doxycycline for a time while we can work through what we need to do with things here. She does have evidence of osteomyelitis based on what I saw on the MRI today. This obviously is unfortunate and definitely not something that she was hoping to hear. With that being said I do believe that she would potentially be a strong candidate for hyperbaric oxygen therapy. Also believe she could be a candidate for total contact cast  though we have to be cautious due to the fact that how her foot is bending inward I think that this is good to be a little bit of a concern. We will definitely have to keeping a close eye on things. She does have a history of diabetes mellitus type 2. She also other than the amputation has a medical history positive for hypertension. She has never undergone hyperbaric oxygen therapy previously I did show her the chamber we did talk a little bit about it today as well. Admission T [facility]: o 04/06/2021 Patient presents here in [place] today for evaluation. Again she is establishing care here after I saw her last week in [place]. Obviously I think that she is doing extremely well at this point which is great news and in general I am extremely pleased with where things stand from the standpoint of the appearance of the wound. Obviously this is not significantly smaller but does look a lot cleaner I think using the Hydrofera Blue has been of benefit over the past week. Nonetheless she still has a wound with issues with pressure here which I think is good to be an ongoing issue. Also think that the osteomyelitis which is chronic is also getting ongoing issue. She does want to try to do what she can to prevent this from worsening and ending with a below-knee amputation. T that end I do think that getting her into the hyperbaric oxygen chamber would be what we need to do. She has recently been seen by cardiology o and subsequently well as far as that is concerned. Her ejection fraction was appropriate for hyperbarics and everything seems to be doing great in that regard. She has not had  a recent chest x-ray she is a former smoker around 15 years ago she quit. Nonetheless I do believe with her asthma we do want to do a chest x-ray just to make sure everything is okay before proceeding with the hyperbarics although we can go ahead and see about getting the approval. I also think she is going  require some sharp debridement and around this wound we also have to contact her insurance for prior approval on this as well. 04/13/2021 upon evaluation today patient appears to be doing a little bit better in regard to her leg in fact the swelling is dramatically better. Very pleased with where things stand in that regard. Fortunately there does not appear to be any signs of active infection at this time. No fevers, chills, nausea, vomiting, or diarrhea. 04/20/2021 upon evaluation today patient appears to be doing decently well in regard to her foot. There is some need for sharp debridement here today. With that being said we will get a go ahead and proceed with that today as the foot is becoming somewhat macerated with the overhanging callus that is definitely not we want to see. With that being said the patient does have an issue here as well with a boil in the perineal region unfortunately that is an issue for her today as well. I did have a look at that as well. We are still working on dealing with insurance as far as getting hyperbarics approved. 04/27/2021 upon evaluation today patient appears to be doing somewhat poorly in general compared to where she has been. At this point she is having a lot of swelling which is the main concerning thing that I am seeing. There does not appear to be any signs of infection currently which is good news but at the same time I do feel like that she is a lot more swollen than she was even last week and is showing how much she is weeping in regard to the right lower leg. She also has a blister on the left breast although is not an open wound at this time. She continues to have the area in the perineum as well. 05/04/2021 upon evaluation today patient appears to actually be doing decently well in regard to her wound on the foot. Fortunately there is no signs of active infection at this time. No fevers, chills, nausea, vomiting, or diarrhea. Unfortunately she does have  an area on the left breast which is actually appearing to be a burn based on what I see physically. She does note that she uses rice bags for areas in general and may have left one in that area not realizing it was burning her as she does not really have much feeling. I think this is very probable based on what I am seeing. For that reason working to treat this as such I do think we need to loosen up a lot of the necrotic tissue here. I Am going tosend in a prescription for Santyl for the patient. 9/1; patient presents for HBO today but cannot do treatment due to wheezing and feeling short of breath when laying down. She was set up as a doctor visit today since she was not able to do HBO. She states she feels okay overall and started having a dry cough over the past couple days. She has not tested herself for COVID. She denies fever/chills or sputum production. She thinks her symptoms are related to allergies. She has been using silver alginate to her right  plantar foot wound. She has not been using Santyl to the breast wound because she reports forgetting to do this. She uses zinc oxide to her perennial wound. She currently denies systemic signs of infection. 9/8; patient presents for follow-up. She has been a unable to do HBO treatments for the past week due to her back pain. She is currently taking Flexeril to address this issue. She reports no issues with her compression wrap. She has been putting Santyl on the left breast wound and Monistat cream to the perennial wound. She denies signs of infection. 9/15; patient presents for follow-up. Again she is unable to do HBO treatment today due to sinus pain. She has 2 new wounds to her right foot which she is unaware of. She has been putting Santyl on the left breast wound and zinc oxide to the perineal wound. She currently denies signs of infection. Readmission 10/17/2021 Ms. Ashima Laframboise is a 62 year old female with a past medical history of type 2  diabetes, osteomyelitis status post right BKA and morbid obesity that presents to the clinic for multiple scattered open wounds to her body. These are located to the abdomen, left posterior leg and sacral region. She has been using mupirocin ointment, Neosporin and zinc oxide to the wound beds. She has been started on Bactrim and Keflex by her primary care physician and states she has several days left to complete the course. She currently denies systemic signs of infection. READMISSION This is a 62 year old morbidly obese type II diabetic (poorly controlled with last hemoglobin A1c 9.2%). She has congestive heart failure, peripheral vascular disease, hypertension, coronary artery disease, venous stasis, connective tissue disease (o Sjogren's syndrome), and bilateral lower extremity edema. She has been seen here for various reasons in the past. She has a right above-knee amputation. She was recently hospitalized for a left diabetic foot ulcer. Orthopedic surgery was consulted. An MRI was performed that was not conclusive for osteomyelitis, but given the extent of the necrosis, orthopedics recommended amputation. Infectious disease was also consulted and have recommended a 6-week course of oral doxycycline and Augmentin. Apparently wound care was also consulted while she was in the hospital and simply recommended painting the area with Betadine. She saw infectious disease on October 27 with plans to continue doxycycline on Augmentin to continue therapy until November 14. The infectious disease provider also recommended that the patient reconsider amputation. Her PCP referred her to the wound care center, as the patient is very reluctant to undergo amputation. She also has a small ulcer on her anterior tibial surface that recently opened after a minor trauma. On exam, her left foot is rolled such that the lateral aspect of her foot is the contact surface. There is a large ulcer with exposed necrotic  muscle and fat. The wound does not probe to bone, but apparently there was bone exposure in the past while she was in the hospital. No malodor or purulent drainage. The wound on her anterior tibial surface is small with just a little slough accumulation. She has modest edema and skin changes consistent with stasis dermatitis. 07/28/2022: The anterior tibial wound is healed. The left plantar foot wound looks a little bit better today. There is significantly less necrotic tissue present. There is an area at the most caudal aspect of the wound that looks like there is ongoing pressure-induced tissue injury. Bone remains covered. 08/11/2022: The wound has deteriorated quite a bit since her last visit. Her husband reports that she has had significantly more drainage,  such that he has had to change the dressing multiple times per day. During the course of the visit, she recalled that she had not been taking her Lasix for at least 4 days. There is substantial periwound maceration and further tissue breakdown. There is necrotic tissue at the midportion of the wound and tendon is now exposed underneath Cho, Vianca M ([id number]) [phone].pdf Page 16 of 23 this necrotic muscle. 08/17/2022: The wound looks better this week. There is still an area of necrotic muscle and tendon in the midfoot, but the forefoot has improved with some epithelium beginning to come in from the margins. Unfortunately, there is evidence of ongoing pressure-induced tissue injury near the heel. The patient does admit to resting her heel on a stool frequently. She is still taking Bactrim as prescribed and her Keystone topical antibiotic compound should arrive at their home today. 12/14; fairly large sized wound on the left plantar foot. She has a right AKA. She uses a leg only for transfers she says she does not propel her wheelchair with that foot. She has been using Keystone, silver alginate and an Ace wrap. She  has completed her Bactrim and Augmentin. She is a type II diabetic. The most worrisome part of this wound is a smaller area roughly 2 x 2 in the distal part of the wound which is 100% occupied by necrotic tendon 09/07/2022: She had an extensive debridement at her last visit. The wound is smaller today and all of the tissues appear healthier. 09/15/2022: The wound measurements are about the same, but overall the surface appears healthier. There is still a little bit of the nonviable tendon exposed at the midfoot. 10/13/2022: The patient has been ill and unable to attend clinic for almost a month. Today, the wound measures smaller and there are buds of epithelium beginning to emerge, particularly at the calcaneus. There is a bridge of skin trying to come across the midportion of the wound, as well. Minimal slough accumulation. 10/26/2022: The absolute wound measurements have not changed, but the surface is technically smaller due to a bridge of epithelium that nearly divides the wound in two. There is some eschar at the medial edge of the calcaneal aspect of the wound, underneath which there is good epithelium. There is some slough on the wound surface, predominantly at the heel. 11/14/2022: She has been absent from clinic for couple of weeks due to health issues. There is more epithelium encroaching upon the edges of the wound. She has built up some eschar around the edges. There is thin biofilm and slough on the wound surface. 12/12/2022: The wound is narrower this week. There is some eschar and macerated callus around the edges with biofilm on the surface. There is a bridge of skin attempting to divide the wound into 2 sections. 12/26/2022: The wound on her plantar left foot is smaller and the bridge of skin between the 2 sections is getting wider. There is a little bit of eschar around the edges and thin slough on the surface. Unfortunately, she has multiple additional wounds that have actually been present  for some time but she only just now is bringing them to our attention. She has wounds on her pubis and on the distal portion of her AKA stump as well as a wound on her mid abdomen. The pubis and AKA stump wounds look as though they are secondary to friction or pressure. The wound in her mid abdomen is of unclear etiology. It is quite dry. She has  moisture-related tissue breakdown in her intertriginous folds and there is a strong smell of yeast coming from her groin region. 01/10/2023: She feels poorly today and was unable to transfer from her wheelchair to the exam table. The only wounds we were able to assess today were those on her upper abdomen, the one at her umbilicus, and the plantar left foot ulcer. The abdominal sites show evidence of picking and scratching. There is slough and eschar on all of the surfaces. The plantar ulcer on the left has some biofilm on the surface, but it continues to contract and epithelial bridge is larger. 01/25/2019: Multiple additional wounds were uncovered today. She has a cluster on her left upper abdomen, a larger 1 on her left breast, wounds in her intertriginous folds, additional wounds on her posterior left thigh and in the groin, along with more extensive wounds on her mons. The wound on her plantar foot is looking a little bit better, but does seem a bit dry. 02/08/2023: All of the wounds look a little bit better today. The 2 clusters of abdominal wounds all measured smaller as did the wound on her left breast. The wounds on her mons are clean and per measurements also a little bit smaller. The wound on her left medial ankle has dried up considerably and there are just a couple of the open sites remaining. The wound in her right groin has some slough accumulation as well as some fat necrosis. 03/13/2023: She has new wounds in her left groin that extend into the fat layer with slough accumulation. The foot has deteriorated; her husband says she had a period where  there was a lot of drainage that resulted in tissue breakdown. She is clearly having some drainage from her left ankle, as well as there is slough accumulation on a cracked skin surface. She has a new wound on her left breast, just medial to the existing wound. She says she has no idea how it started but that she simply woke up one morning with a wound there and her entire breast reddened. She denies any use of external heat source such as a heating pad. All of the other wounds are about the same with slough accumulation. The new breast wound has some eschar and fibrotic surface. The periumbilical wound also has some eschar accumulation. Patient History Information obtained from Patient. Family History Diabetes - Mother, Heart Disease - Mother,Father,Siblings, Hypertension - Mother,Father,Siblings, Lung Disease - Father, Stroke - Mother, No family history of Cancer, Hereditary Spherocytosis, Kidney Disease, Seizures, Thyroid Problems, Tuberculosis. Social History Former smoker - quit 15 years ago, Marital Status - Married, Alcohol Use - Never, Drug Use - No History, Caffeine Use - Rarely. Medical History Eyes Patient has history of Cataracts - both eyes Denies history of Glaucoma, Optic Neuritis Ear/Nose/Mouth/Throat Denies history of Chronic sinus problems/congestion, Middle ear problems Hematologic/Lymphatic Patient has history of Lymphedema Denies history of Anemia, Hemophilia, Human Immunodeficiency Virus, Sickle Cell Disease Respiratory Patient has history of Asthma Denies history of Aspiration, Chronic Obstructive Pulmonary Disease (COPD), Pneumothorax, Sleep Apnea, Tuberculosis Cardiovascular Patient has history of Congestive Heart Failure, Coronary Artery Disease, Hypertension, Peripheral Arterial Disease, Peripheral Venous Disease Denies history of Angina, Arrhythmia, Hypotension, Myocardial Infarction, Phlebitis, Vasculitis Gastrointestinal Denies history of Hepatitis A,  Hepatitis B, Hepatitis C Endocrine Patient has history of Type II Diabetes Denies history of Type I Diabetes Genitourinary Denies history of End Stage Renal Disease Immunological Denies history of Lupus Erythematosus, Raynauds, Scleroderma Integumentary (Skin) Denies history of History  of Burn Musculoskeletal [place]ango, Rameen M ([id number]) [id number] Page 17 of 23 Patient has history of Rheumatoid Arthritis Denies history of Gout, Osteoarthritis, Osteomyelitis Neurologic Patient has history of Neuropathy Denies history of Dementia, Quadriplegia, Paraplegia, Seizure Disorder Oncologic Denies history of Received Chemotherapy, Received Radiation Psychiatric Denies history of Anorexia/bulimia, Confinement Anxiety Hospitalization/Surgery History - 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 Lajoyce Corners 01/2020. 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 - 11/22 R AKA. Medical A Surgical History Notes nd Constitutional Symptoms (General Health) hypothyroidism Cardiovascular cardiac stents Gastrointestinal GERD Endocrine Hypothyroidism Immunological Mix connective tissue disease- autoimmune Sjogren's syndrome Musculoskeletal Fibromyalgia, Scoliosis, Objective Constitutional no acute distress. Vitals Time Taken: 10:38 AM, Height: 62 in, Weight: 284 lbs, BMI: 51.9, Temperature: 98.2 F, Pulse: 86 bpm, Respiratory Rate: 18 breaths/min, Blood Pressure: 134/80 mmHg. Respiratory Normal work of breathing on room air. General Notes: 03/13/2023: She has new wounds in her left groin that extend into the fat layer with slough accumulation. The foot has deteriorated. She is clearly having some drainage from her left ankle, as well as there is slough accumulation on a cracked skin surface. She has a new wound on her left breast, just medial to the existing wound. All of the other wounds are about the same with slough accumulation. The new breast wound has some eschar and  fibrotic surface. The periumbilical wound also has some eschar accumulation. Integumentary (Hair, Skin) Wound #19 status is Open. Original cause of wound was Gradually Appeared. The date acquired was: 05/17/2022. The wound has been in treatment 33 weeks. The wound is located on the Left,Plantar Foot. The wound measures 11cm length x 3.1cm width x 0.2cm depth; 26.782cm^2 area and 5.356cm^3 volume. There is Fat Layer (Subcutaneous Tissue) exposed. There is a medium amount of serosanguineous drainage noted. The wound margin is distinct with the outline attached to the wound base. There is large (67-100%) pink granulation within the wound bed. There is a small (1-33%) amount of necrotic tissue within the wound bed including Adherent Slough. The periwound skin appearance had no abnormalities noted for moisture. The periwound skin appearance exhibited: Callus, Scarring. The periwound skin appearance did not exhibit: Crepitus, Excoriation, Induration, Rash, Atrophie Blanche, Cyanosis, Ecchymosis, Hemosiderin Staining, Mottled, Pallor, Rubor, Erythema. Periwound temperature was noted as No Abnormality. Wound #20 status is Open. Original cause of wound was Pressure Injury. The date acquired was: 12/14/2022. The wound has been in treatment 11 weeks. The wound is located on the Right Amputation Site - Above Knee. The wound measures 3.1cm length x 3cm width x 0.1cm depth; 7.304cm^2 area and 0.73cm^3 volume. There is Fat Layer (Subcutaneous Tissue) exposed. There is no tunneling or undermining noted. There is a medium amount of serosanguineous drainage noted. There is large (67-100%) red granulation within the wound bed. There is a small (1-33%) amount of necrotic tissue within the wound bed including Adherent Slough. The periwound skin appearance had no abnormalities noted for moisture. The periwound skin appearance had no abnormalities noted for color. The periwound skin appearance exhibited: Scarring. Wound #21  status is Open. Original cause of wound was Pressure Injury. The date acquired was: 12/14/2022. The wound has been in treatment 11 weeks. The wound is located on the Medial Pubis. The wound measures 9.3cm length x 13cm width x 0.1cm depth; 94.955cm^2 area and 9.495cm^3 volume. There is Fat Layer (Subcutaneous Tissue) exposed. There is no tunneling or undermining noted. There is a medium amount of serosanguineous drainage noted. There is large (67-100%) red granulation within  the wound bed. There is a small (1-33%) amount of necrotic tissue within the wound bed including Adherent Slough. The periwound skin appearance had no abnormalities noted for moisture. The periwound skin appearance had no abnormalities noted for color. The periwound skin appearance exhibited: Scarring. Periwound temperature was noted as No Abnormality. Wound #22 status is Open. Original cause of wound was Pressure Injury. The date acquired was: 12/14/2022. The wound has been in treatment 11 weeks. The wound is located on the Left,Medial Upper Leg. The wound measures 10cm length x 8.3cm width x 0.1cm depth; 65.188cm^2 area and 6.519cm^3 volume. There is Fat Layer (Subcutaneous Tissue) exposed. There is no tunneling or undermining noted. There is a medium amount of serosanguineous drainage noted. There is large (67-100%) red granulation within the wound bed. There is a small (1-33%) amount of necrotic tissue within the wound bed including Adherent Slough. The periwound skin appearance had no abnormalities noted for moisture. The periwound skin appearance had no abnormalities noted for color. The periwound skin appearance exhibited: Scarring. Periwound temperature was noted as No Abnormality. Wound #23 status is Open. Original cause of wound was Other Lesion. The date acquired was: 12/14/2022. The wound has been in treatment 11 weeks. The wound is located on the Midline Umbilicus. The wound measures 7cm length x 1.5cm width x 0.1cm depth;  8.247cm^2 area and 0.825cm^3 volume. There is Fat Layer (Subcutaneous Tissue) exposed. There is a medium amount of serosanguineous drainage noted. There is medium (34-66%) pink granulation within the wound bed. There is a medium (34-66%) amount of necrotic tissue within the wound bed. The periwound skin appearance had no abnormalities noted for texture. The periwound skin appearance had no abnormalities noted for moisture. The periwound skin appearance had no abnormalities noted for color. Periwound temperature was noted as No Abnormality. Wound #24 status is Open. Original cause of wound was Other Lesion. The date acquired was: 12/14/2022. The wound has been in treatment 11 weeks. The wound Henricksen, Delcenia M ([id number]) [id number] Page 18 of 23 is located on the Abdomen - midline. The wound measures 2.5cm length x 6cm width x 0.1cm depth; 11.781cm^2 area and 1.178cm^3 volume. There is Fat Layer (Subcutaneous Tissue) exposed. There is no tunneling or undermining noted. There is a medium amount of serosanguineous drainage noted. There is large (67-100%) red granulation within the wound bed. There is a small (1-33%) amount of necrotic tissue within the wound bed including Eschar and Adherent Slough. The periwound skin appearance had no abnormalities noted for texture. The periwound skin appearance had no abnormalities noted for moisture. The periwound skin appearance had no abnormalities noted for color. Periwound temperature was noted as No Abnormality. Wound #25 status is Open. Original cause of wound was Gradually Appeared. The date acquired was: 01/25/2023. The wound has been in treatment 6 weeks. The wound is located on the Left Abdomen - Lower Quadrant. The wound measures 10cm length x 8cm width x 0.1cm depth; 62.832cm^2 area and 6.283cm^3 volume. There is Fat Layer (Subcutaneous Tissue) exposed. There is no tunneling or undermining noted. There is a medium amount of  serosanguineous drainage noted. There is small (1-33%) red granulation within the wound bed. There is a large (67-100%) amount of necrotic tissue within the wound bed including Adherent Slough. The periwound skin appearance had no abnormalities noted for texture. The periwound skin appearance had no abnormalities noted for moisture. The periwound skin appearance had no abnormalities noted for color. Periwound temperature was noted as No Abnormality. Wound #26 status is  Open. Original cause of wound was Gradually Appeared. The date acquired was: 01/25/2023. The wound has been in treatment 6 weeks. The wound is located on the Right Abdomen - Lower Quadrant. The wound measures 1cm length x 3cm width x 0.1cm depth; 2.356cm^2 area and 0.236cm^3 volume. There is Fat Layer (Subcutaneous Tissue) exposed. There is no tunneling or undermining noted. There is a medium amount of serosanguineous drainage noted. There is large (67-100%) red granulation within the wound bed. There is a small (1-33%) amount of necrotic tissue within the wound bed including Adherent Slough. The periwound skin appearance had no abnormalities noted for texture. The periwound skin appearance had no abnormalities noted for moisture. The periwound skin appearance had no abnormalities noted for color. Periwound temperature was noted as No Abnormality. Wound #27 status is Open. Original cause of wound was Gradually Appeared. The date acquired was: 01/25/2023. The wound has been in treatment 6 weeks. The wound is located on the Left Breast. The wound measures 8.3cm length x 3.5cm width x 0.1cm depth; 22.816cm^2 area and 2.282cm^3 volume. There is Fat Layer (Subcutaneous Tissue) exposed. There is no tunneling or undermining noted. There is a medium amount of serosanguineous drainage noted. There is medium (34-66%) red granulation within the wound bed. There is a medium (34-66%) amount of necrotic tissue within the wound bed including Eschar  and Adherent Slough. The periwound skin appearance had no abnormalities noted for texture. The periwound skin appearance had no abnormalities noted for moisture. The periwound skin appearance had no abnormalities noted for color. Periwound temperature was noted as No Abnormality. 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 Erythema intertrigo Non-pressure chronic ulcer of right thigh with fat layer exposed Non-pressure chronic ulcer of skin of other sites with fat layer exposed Non-pressure chronic ulcer of other part of left lower leg with fat layer exposed Osteomyelitis, unspecified Type 2 diabetes mellitus with foot ulcer Morbid (severe) obesity due to excess calories Sjogren syndrome, unspecified Peripheral vascular disease, unspecified Chronic diastolic (congestive) heart failure Acquired absence of right leg above knee Procedures Wound #19 Pre-procedure diagnosis of Wound #19 is a Diabetic Wound/Ulcer of the Lower Extremity located on the Left,Plantar Foot .Severity of Tissue Pre Debridement is: Fat layer exposed. There was a Selective/Open Wound Non-Viable Tissue Debridement with a total area of 26.77 sq cm performed by Duanne Guess, MD. With the following instrument(s): Curette to remove Non-Viable tissue/material. Material removed includes [facility] after achieving pain control using Lidocaine 4% T opical Solution. No specimens were taken. A time out was conducted at 11:30, prior to the start of the procedure. A Minimum amount of bleeding was controlled with Pressure. The procedure was tolerated well with a pain level of 0 throughout and a pain level of 0 following the procedure. Post Debridement Measurements: 11cm length x 3.1cm width x 0.2cm depth; 5.356cm^3 volume. Character of Wound/Ulcer Post Debridement is improved. Severity of Tissue Post Debridement is: Fat layer exposed. Post procedure Diagnosis Wound #19:  Same as Pre-Procedure General Notes: Scribed for Dr. Lady Gary by J.Scotton. Wound #20 Pre-procedure diagnosis of Wound #20 is a Pressure Ulcer located on the Right Amputation Site - Above Knee . There was a Selective/Open Wound Non-Viable Tissue Debridement with a total area of 7.3 sq cm performed by Duanne Guess, MD. With the following instrument(s): Curette to remove Non-Viable tissue/material. Material removed includes [facility] after achieving pain control using Lidocaine 4% Topical Solution. No specimens were taken. A time out  was conducted at 11:30, prior to the start of the procedure. A Minimum amount of bleeding was controlled with Pressure. The procedure was tolerated well with a pain level of 0 throughout and a pain level of 0 following the procedure. Post Debridement Measurements: 3.1cm length x 3cm width x 0.1cm depth; 0.73cm^3 volume. Post debridement Stage noted as Category/Stage II. Character of Wound/Ulcer Post Debridement is improved. Post procedure Diagnosis Wound #20: Same as Pre-Procedure General Notes: Scribed for Dr. Lady Gary by J.Scotton. Wound #21 Pre-procedure diagnosis of Wound #21 is a Pressure Ulcer located on the Medial Pubis . There was a Selective/Open Wound Non-Viable Tissue Debridement with a total area of 94.91 sq cm performed by Duanne Guess, MD. With the following instrument(s): Curette to remove Non-Viable tissue/material. Material removed includes [facility] after achieving pain control using Lidocaine 4% Topical Solution. No specimens were taken. A time out was conducted at 11:30, prior to the start of the procedure. A Minimum amount of bleeding was controlled with Pressure. The procedure was tolerated well with a pain level of 0 throughout and a pain level of 0 following the procedure. Post Debridement Measurements: 9.3cm length x 13cm width x 0.1cm depth; 9.495cm^3 volume. Post debridement Stage noted as Category/Stage II. Character of Wound/Ulcer Post  Debridement is improved. Post procedure Diagnosis Wound #21: Same as Pre-Procedure General Notes: Scribed for Dr. Lady Gary by J.Scotton. Wound #22 Pre-procedure diagnosis of Wound #22 is a Pressure Ulcer located on the Left,Medial Upper Leg . There was a Selective/Open Wound Non-Viable Tissue Salazar, Tamesha M ([id number]) [id number] Page 19 of 23 Debridement with a total area of 65.16 sq cm performed by Duanne Guess, MD. With the following instrument(s): Curette to remove Non-Viable tissue/material. Material removed includes [facility] after achieving pain control using Lidocaine 4% Topical Solution. No specimens were taken. A time out was conducted at 11:30, prior to the start of the procedure. A Minimum amount of bleeding was controlled with Pressure. The procedure was tolerated well with a pain level of 0 throughout and a pain level of 0 following the procedure. Post Debridement Measurements: 10cm length x 8.3cm width x 0.1cm depth; 6.519cm^3 volume. Post debridement Stage noted as Category/Stage II. Character of Wound/Ulcer Post Debridement is improved. Post procedure Diagnosis Wound #22: Same as Pre-Procedure General Notes: Scribed for Dr. Lady Gary by J.Scotton. Wound #23 Pre-procedure diagnosis of Wound #23 is an Abrasion located on the Midline Umbilicus . There was a Selective/Open Wound Non-Viable Tissue Debridement with a total area of 8.24 sq cm performed by Duanne Guess, MD. With the following instrument(s): Curette to remove Non-Viable tissue/material. Material removed includes [facility] after achieving pain control using Lidocaine 4% Topical Solution. No specimens were taken. A time out was conducted at 11:30, prior to the start of the procedure. A Minimum amount of bleeding was controlled with Pressure. The procedure was tolerated well with a pain level of 0 throughout and a pain level of 0 following the procedure. Post Debridement Measurements: 7cm length x  1.5cm width x 0.1cm depth; 0.825cm^3 volume. Character of Wound/Ulcer Post Debridement is improved. Post procedure Diagnosis Wound #23: Same as Pre-Procedure General Notes: Scribed for Dr. Lady Gary by J.Scotton. Wound #24 Pre-procedure diagnosis of Wound #24 is a Lesion located on the Abdomen - midline . There was a Selective/Open Wound Non-Viable Tissue Debridement with a total area of 11.78 sq cm performed by Duanne Guess, MD. With the following instrument(s): Curette to remove Non-Viable tissue/material. Material removed includes Eschar and Slough and after achieving pain control using  Lidocaine 4% T opical Solution. No specimens were taken. A time out was conducted at 11:30, prior to the start of the procedure. A Minimum amount of bleeding was controlled with Pressure. The procedure was tolerated well with a pain level of 0 throughout and a pain level of 0 following the procedure. Post Debridement Measurements: 2.5cm length x 6cm width x 0.1cm depth; 1.178cm^3 volume. Character of Wound/Ulcer Post Debridement is improved. Post procedure Diagnosis Wound #24: Same as Pre-Procedure General Notes: Scribed for Dr. Lady Gary by J.Scotton. Wound #25 Pre-procedure diagnosis of Wound #25 is a Lesion located on the Left Abdomen - Lower Quadrant . There was a Selective/Open Wound Non-Viable Tissue Debridement with a total area of 62.8 sq cm performed by Duanne Guess, MD. With the following instrument(s): Curette to remove Non-Viable tissue/material. Material removed includes [facility] after achieving pain control using Lidocaine 4% Topical Solution. No specimens were taken. A time out was conducted at 11:30, prior to the start of the procedure. A Minimum amount of bleeding was controlled with Pressure. The procedure was tolerated well with a pain level of 0 throughout and a pain level of 0 following the procedure. Post Debridement Measurements: 10cm length x 8cm width x 0.1cm depth;  6.283cm^3 volume. Character of Wound/Ulcer Post Debridement is improved. Post procedure Diagnosis Wound #25: Same as Pre-Procedure General Notes: Scribed for Dr. Lady Gary by J.Scotton. Wound #26 Pre-procedure diagnosis of Wound #26 is a Lesion located on the Right Abdomen - Lower Quadrant . There was a Selective/Open Wound Non-Viable Tissue Debridement with a total area of 2.36 sq cm performed by Duanne Guess, MD. With the following instrument(s): Curette to remove Non-Viable tissue/material. Material removed includes [facility] after achieving pain control using Lidocaine 4% Topical Solution. No specimens were taken. A time out was conducted at 11:30, prior to the start of the procedure. A Minimum amount of bleeding was controlled with Pressure. The procedure was tolerated well with a pain level of 0 throughout and a pain level of 0 following the procedure. Post Debridement Measurements: 1cm length x 3cm width x 0.1cm depth; 0.236cm^3 volume. Character of Wound/Ulcer Post Debridement is improved. Post procedure Diagnosis Wound #26: Same as Pre-Procedure General Notes: Scribed for Dr. Lady Gary by J.Scotton. Wound #27 Pre-procedure diagnosis of Wound #27 is a Lesion located on the Left Breast . There was a Selective/Open Wound Non-Viable Tissue Debridement with a total area of 22.8 sq cm performed by Duanne Guess, MD. With the following instrument(s): Curette to remove Non-Viable tissue/material. Material removed includes Eschar and Slough and after achieving pain control using Lidocaine 4% T opical Solution. No specimens were taken. A time out was conducted at 11:30, prior to the start of the procedure. A Minimum amount of bleeding was controlled with Pressure. The procedure was tolerated well with a pain level of 0 throughout and a pain level of 0 following the procedure. Post Debridement Measurements: 8.3cm length x 3.5cm width x 0.1cm depth; 2.282cm^3 volume. Character of Wound/Ulcer Post  Debridement is improved. Post procedure Diagnosis Wound #27: Same as Pre-Procedure General Notes: Scribed for Dr. Lady Gary by J.Scotton. Plan Follow-up Appointments: Return Appointment in 2 weeks. - **** EXTRA TIME at least 1.5 hrs COMPLEX patient**** Dr. Lady Gary Room 3 Other: - Pick up lidocaine at Pharmacy Anesthetic: (In clinic) Topical Lidocaine 5% applied to wound bed Bathing/ Shower/ Hygiene: May shower and wash wound with soap and water. - with dressing changes Edema Control - Lymphedema / SCD / Other: Elevate legs to the level of the  heart or above for 30 minutes daily and/or when sitting for 3-4 times a day throughout the day. Avoid standing for long periods of time. Patient to wear own compression stockings every day. Off-Loading: Other: - Try and elevate Left leg throughout the day The following medication(s) was prescribed: lidocaine topical 5 % cream Use as directed for dressing changes starting 03/13/2023 WOUND #19: - Foot Wound Laterality: Plantar, Left Cleanser: Soap and Water 1 x Per Day/30 Days Discharge Instructions: May shower and wash wound with dial antibacterial soap and water prior to dressing change. Cleanser: Wound Cleanser (Generic) 1 x Per Day/30 Days Discharge Instructions: Cleanse the wound with wound cleanser prior to applying a clean dressing using gauze sponges, not tissue or cotton balls. Prim Dressing: Maxorb Extra Ag+ Alginate Dressing, 4x4.75 (in/in) (Generic) 1 x Per Day/30 Days ary Discharge Instructions: Apply to wound bed as instructed Secondary Dressing: ABD Pad, 5x9 (Generic) 1 x Per Day/30 Days Krider, Arieliz M ([id number]) [id number] Page 20 of 23 Discharge Instructions: Apply over primary dressing as directed. Secondary Dressing: Woven Gauze Sponge, Non-Sterile 4x4 in (Generic) 1 x Per Day/30 Days Discharge Instructions: Apply over primary dressing as directed. Secured With: Elastic Bandage 4 inch (ACE bandage)  (Generic) 1 x Per Day/30 Days Discharge Instructions: Secure with ACE bandage as directed. Secured With: [employer], 4.5x3.1 (in/yd) (Generic) 1 x Per Day/30 Days Discharge Instructions: Secure with Kerlix as directed. Secured With: 52M Medipore Scientist, research (life sciences) Surgical T 2x10 (in/yd) (Generic) 1 x Per Day/30 Days ape Discharge Instructions: Secure with tape as directed. WOUND #20: - Amputation Site - Above Knee Wound Laterality: Right Prim Dressing: Maxorb Extra Ag+ Alginate Dressing, 4x4.75 (in/in) 1 x Per Day/30 Days ary Discharge Instructions: Apply to wound bed as instructed Secondary Dressing: ABD Pad, 8x10 1 x Per Day/30 Days Discharge Instructions: Apply over primary dressing as directed. Secured With: 52M Medipore H Soft Cloth Surgical T ape, 4 x 10 (in/yd) 1 x Per Day/30 Days Discharge Instructions: Secure with tape as directed. WOUND #21: - Pubis Wound Laterality: Medial Peri-Wound Care: Zinc Oxide Ointment 30g tube 1 x Per Day/30 Days Discharge Instructions: Apply Zinc Oxide to periwound with each dressing change Secondary Dressing: ABD Pad, 8x10 1 x Per Day/30 Days Discharge Instructions: Apply over primary dressing as directed. Secured With: 52M Medipore H Soft Cloth Surgical T ape, 4 x 10 (in/yd) 1 x Per Day/30 Days Discharge Instructions: Secure with tape as directed. WOUND #23: - Umbilicus Wound Laterality: Midline Prim Dressing: Maxorb Extra Ag+ Alginate Dressing, 4x4.75 (in/in) 1 x Per Day/30 Days ary Discharge Instructions: Apply to wound bed as instructed Secondary Dressing: ABD Pad, 8x10 1 x Per Day/30 Days Discharge Instructions: Apply over primary dressing as directed. Secured With: 52M Medipore H Soft Cloth Surgical T ape, 4 x 10 (in/yd) 1 x Per Day/30 Days Discharge Instructions: Secure with tape as directed. WOUND #24: - Abdomen - midline Wound Laterality: Topical: Santyl Collagenase Ointment, 30 (gm), tube 1 x Per Day/30 Days Discharge Instructions: Apply  to crusty area Prim Dressing: Maxorb Extra Ag+ Alginate Dressing, 4x4.75 (in/in) 1 x Per Day/30 Days ary Discharge Instructions: Apply to wound bed as instructed Secondary Dressing: ABD Pad, 8x10 1 x Per Day/30 Days Discharge Instructions: Apply over primary dressing as directed. Secured With: 52M Medipore H Soft Cloth Surgical T ape, 4 x 10 (in/yd) 1 x Per Day/30 Days Discharge Instructions: Secure with tape as directed. WOUND #26: - Abdomen - Lower Quadrant Wound Laterality: Right Peri-Wound Care:  Nystop Powder (Nystatin) 1 x Per Day/30 Days Discharge Instructions: Apply Nystop (Nystatin) Powder as instructed Peri-Wound Care: Zinc Oxide Ointment 30g tube 1 x Per Day/30 Days Discharge Instructions: Apply Zinc Oxide to periwound with each dressing change Prim Dressing: Maxorb Extra Ag+ Alginate Dressing, 4x4.75 (in/in) 1 x Per Day/30 Days ary Discharge Instructions: Apply to wound bed as instructed Secondary Dressing: ABD Pad, 8x10 1 x Per Day/30 Days Discharge Instructions: Apply over primary dressing as directed. Secured With: 96M Medipore H Soft Cloth Surgical T ape, 4 x 10 (in/yd) 1 x Per Day/30 Days Discharge Instructions: Secure with tape as directed. WOUND #27: - Breast Wound Laterality: Left Topical: Santyl Collagenase Ointment, 30 (gm), tube 1 x Per Day/30 Days Discharge Instructions: Apply to the crusty wound on left breast Prim Dressing: Maxorb Extra Ag+ Alginate Dressing, 4x4.75 (in/in) 1 x Per Day/30 Days ary Discharge Instructions: Apply to wound bed as instructed Secondary Dressing: ABD Pad, 8x10 1 x Per Day/30 Days Discharge Instructions: Apply over primary dressing as directed. Secured With: 96M Medipore H Soft Cloth Surgical T ape, 4 x 10 (in/yd) 1 x Per Day/30 Days Discharge Instructions: Secure with tape as directed. 03/13/2023: She has new wounds in her left groin that extend into the fat layer with slough accumulation. The foot has deteriorated; her husband says she had  a period where there was a lot of drainage that resulted in tissue breakdown. She is clearly having some drainage from her left ankle, as well as there is slough accumulation on a cracked skin surface. She has a new wound on her left breast, just medial to the existing wound. She says she has no idea how it started but that she simply woke up one morning with a wound there and her entire breast reddened. She denies any use of external heat source such as a heating pad. All of the other wounds are about the same with slough accumulation. The new breast wound has some eschar and fibrotic surface. The periumbilical wound also has some eschar accumulation. I used a curette to debride slough from every wound surface. I also debrided eschar from the new breast wound and the periumbilical wound. We are going to use Santyl on the new breast wound to try and break up the fibrotic eschar on the surface. We will continue to use silver alginate to all of the other locations. This is clearly a palliative situation. Follow-up in 2 weeks. Electronic Signature(s) Signed: 03/13/2023 1:55:37 PM By: Duanne Guess MD FACS Previous Signature: 03/13/2023 11:53:34 AM Version By: Duanne Guess MD FACS Entered By: Duanne Guess on 03/13/2023 13:55:37 Len, Nancylee M ([id number]) [id number] Page 21 of 23 -------------------------------------------------------------------------------- HxROS Details Patient Name: Date of Service: Stanislaw, [name] 03/13/2023 10:15 A M Medical Record Number: [id number] Patient Account Number: [id number] Date of Birth/Sex: Treating RN: Aug 12, 1961 (62 y.o. F) Primary Care Provider: [name] Other Clinician: Referring Provider: Treating Provider/Extender: Vivien Rossetti in Treatment: 33 Information Obtained From Patient Constitutional Symptoms (General Health) Medical History: Past Medical History  Notes: hypothyroidism Eyes Medical History: Positive for: Cataracts - both eyes Negative for: Glaucoma; Optic Neuritis Ear/Nose/Mouth/Throat Medical History: Negative for: Chronic sinus problems/congestion; Middle ear problems Hematologic/Lymphatic Medical History: Positive for: Lymphedema Negative for: Anemia; Hemophilia; Human Immunodeficiency Virus; Sickle Cell Disease Respiratory Medical History: Positive for: Asthma Negative for: Aspiration; Chronic Obstructive Pulmonary Disease (COPD); Pneumothorax; Sleep Apnea; Tuberculosis Cardiovascular Medical History: Positive for: Congestive Heart Failure; Coronary Artery Disease; Hypertension; Peripheral Arterial Disease; Peripheral  Venous Disease Negative for: Angina; Arrhythmia; Hypotension; Myocardial Infarction; Phlebitis; Vasculitis Past Medical History Notes: cardiac stents Gastrointestinal Medical History: Negative for: Hepatitis A; Hepatitis B; Hepatitis C Past Medical History Notes: GERD Endocrine Medical History: Positive for: Type II Diabetes Negative for: Type I Diabetes Past Medical History Notes: Hypothyroidism Time with diabetes: since 2002 Treated with: Insulin, Oral agents Blood sugar tested every day: Yes Tested : 2-3 times a day Genitourinary Medical History: Negative for: End Stage Renal Disease Immunological Medical History: Negative for: Lupus Erythematosus; Raynauds; Scleroderma 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 Hollingshed, Sevilla M ([id number]) [id number] Page 22 of 23 Integumentary (Skin) Medical History: Negative for: History of Burn Musculoskeletal Medical History: Positive for: Rheumatoid Arthritis Negative for: Gout; Osteoarthritis; Osteomyelitis Past Medical History Notes: Fibromyalgia, Scoliosis, Neurologic Medical History: Positive for: Neuropathy Negative for: Dementia; Quadriplegia; Paraplegia;  Seizure Disorder Oncologic Medical History: Negative for: Received Chemotherapy; Received Radiation Psychiatric Medical History: Negative for: Anorexia/bulimia; Confinement Anxiety HBO Extended History Items Eyes: Cataracts Immunizations Pneumococcal Vaccine: Received Pneumococcal Vaccination: Yes Received Pneumococcal Vaccination On or After 60th Birthday: Yes Implantable Devices None Hospitalization / Surgery History Type of Hospitalization/Surgery 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 Family and Social History Cancer: No; Diabetes: Yes - Mother; Heart Disease: Yes - Mother,Father,Siblings; Hereditary Spherocytosis: No; Hypertension: Yes - Mother,Father,Siblings; Kidney Disease: No; Lung Disease: Yes - Father; Seizures: No; Stroke: Yes - Mother; Thyroid Problems: No; Tuberculosis: No; Former smoker - quit 15 years ago; Marital Status - Married; Alcohol Use: Never; Drug Use: No History; Caffeine Use: Rarely; Financial Concerns: No; Food, Clothing or Shelter Needs: No; Support System Lacking: No; Transportation Concerns: No Electronic Signature(s) Signed: 03/13/2023 12:28:07 PM By: Duanne Guess MD FACS Entered By: Duanne Guess on 03/13/2023 11:50:44 -------------------------------------------------------------------------------- SuperBill Details Patient Name: Date of Service: Jansma, [name] 03/13/2023 Medical Record Number: [id number] Patient Account Number: [id number] Date of Birth/Sex: Treating RN: 1961/01/20 (62 y.o. F) Primary Care Provider: [name] Other Clinician: Referring Provider: Treating Provider/Extender: Vivien Rossetti in Treatment: [address] 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 L30.4 Erythema intertrigo L97.112 Non-pressure chronic ulcer of right thigh with fat layer exposed L98.492 Non-pressure chronic ulcer of skin of other sites with fat layer exposed L97.822 Non-pressure chronic ulcer of other part of left lower leg with fat layer exposed M86.9 Osteomyelitis, unspecified E11.621 Type 2 diabetes mellitus with foot ulcer E66.01 Morbid (severe) obesity due to excess calories M35.00 Sjogren syndrome, unspecified I73.9 Peripheral vascular disease, unspecified I50.32 Chronic diastolic (congestive) heart failure 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 Facility Procedures : 7 CPT4 Code: 0865784 Description: 97597 - DEBRIDE WOUND 1ST 20 SQ CM OR < ICD-10 Diagnosis Description 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 L97.112 Non-pressure chronic ulcer of right thigh with fat layer exposed L98.492  Non-pressure chronic ulcer of skin of other sites with fat layer exposed L97.822 Non-pressure chronic ulcer of other part of left lower leg with fat layer exposed Modifier: Quantity: 1 : 7 CPT4 Code: 6962952 Description: 97598 - DEBRIDE WOUND EA ADDL 20 SQ CM ICD-10 Diagnosis Description 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 L97.112 Non-pressure chronic ulcer of right thigh with fat layer exposed L98.492  Non-pressure chronic ulcer of skin of other sites with fat layer exposed L97.822 Non-pressure chronic ulcer of other part of left lower leg with fat layer exposed Modifier: Quantity: 15 Physician Procedures : CPT4 Code Description Modifier [id number] - WC PHYS LEVEL 4 - EST PT 25 ICD-10 Diagnosis Description 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 L97.112 Non-pressure chronic ulcer of right thigh with fat layer  exposed L98.492 Non-pressure chronic ulcer of skin of other sites with fat layer exposed L97.822 Non-pressure  chronic ulcer of other part of left lower leg with fat layer exposed Quantity: 1 : [id number] 97597 - WC PHYS DEBR WO ANESTH 20 SQ CM ICD-10 Diagnosis Description 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 L97.112 Non-pressure chronic ulcer of right thigh with fat layer exposed L98.492  Non-pressure chronic ulcer of skin of other sites with fat layer exposed L97.822 Non-pressure chronic ulcer of other part of left lower leg with fat layer exposed Quantity: 1 : [id number] 97598 - WC PHYS DEBR WO ANESTH EA ADD 20 CM ICD-10 Diagnosis Description 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

## 2023-03-16 ENCOUNTER — Other Ambulatory Visit: Payer: Self-pay | Admitting: Family Medicine

## 2023-03-16 DIAGNOSIS — J45909 Unspecified asthma, uncomplicated: Secondary | ICD-10-CM

## 2023-03-28 ENCOUNTER — Ambulatory Visit (HOSPITAL_BASED_OUTPATIENT_CLINIC_OR_DEPARTMENT_OTHER): Payer: Commercial Managed Care - HMO | Admitting: General Surgery

## 2023-03-28 NOTE — Progress Notes (Signed)
Baze, Juanitta M ([id number]) [id number] Page 1 of 20 Visit Report for 03/13/2023 Arrival Information Details Patient Name: Date of Service: [place], [name] 03/13/2023 10:15 A M Medical Record Number: [id number] Patient Account Number: [id number] Date of Birth/Sex: Treating RN: 1961-05-28 (62 y.o. Gevena Mart Primary Care Laurey Salser: [name] Other Clinician: Referring Kynzee Devinney: Treating Phoebie Shad/Extender: Vivien Rossetti in Treatment: 33 Visit Information History Since Last Visit All ordered tests and consults were completed: Yes Patient Arrived: Wheel Chair Added or deleted any medications: No Arrival Time: 10:37 Any new allergies or adverse reactions: No Accompanied By: spouse Had a fall or experienced change in No Transfer Assistance: EasyPivot Patient Lift activities of daily living that may affect Patient Identification Verified: Yes risk of falls: Secondary Verification Process Completed: Yes Signs or symptoms of abuse/neglect since last visito No Patient Requires Transmission-Based Precautions: No Hospitalized since last visit: No Patient Has Alerts: No Implantable device outside of the clinic excluding No cellular tissue based products placed in the center since last visit: Has Dressing in Place as Prescribed: Yes Pain Present Now: Yes Electronic Signature(s) Signed: 03/28/2023 7:59:22 AM By: Brenton Grills Entered By: Brenton Grills on 03/13/2023 10:38:30 -------------------------------------------------------------------------------- Encounter Discharge Information Details Patient Name: Date of Service: [place]owski, [name] 03/13/2023 10:15 A M Medical Record Number: [id number] Patient Account Number: [id number] Date of Birth/Sex: Treating RN: [birth year] (62 y.o. [name] Primary Care Camilo Mander: [name] Other Clinician: Referring Otoniel Myhand: Treating Vanna Sailer/Extender: Vivien Rossetti in Treatment: 33 Encounter Discharge Information Items Post Procedure Vitals Discharge Condition: Stable Temperature (F): 98.2 Ambulatory Status: Wheelchair Pulse (bpm): 86 Discharge Destination: Home Respiratory Rate (breaths/min): 18 Transportation: Private Auto Blood Pressure (mmHg): 134/80 Accompanied By: spouse Schedule Follow-up Appointment: Yes Clinical Summary of Care: Patient Declined Electronic Signature(s) Signed: 03/13/2023 5:05:24 PM By: Karie Schwalbe RN Entered By: Karie Schwalbe on 03/13/2023 16:45:07 Mcgonagle, Margit M ([id number]) [id number] Page 2 of 20 -------------------------------------------------------------------------------- Lower Extremity Assessment Details Patient Name: Date of Service: Mahabir, [name] 03/13/2023 10:15 A M Medical Record Number: [id number] Patient Account Number: [id number] Date of Birth/Sex: Treating RN: 1961-09-09 (62 y.o. Gevena Mart Primary Care Kyuss Hale: [name] Other Clinician: Referring Faatima Tench: Treating Annise Boran/Extender: Vivien Rossetti in Treatment: 33 Edema Assessment Assessed: Kyra Searles: No] Franne Forts: Yes] Edema: [Left: Ye] [Right: s] Calf Left: Right: Point of Measurement: 30 cm From Medial Instep 48 cm Ankle Left: Right: Point of Measurement: 9 cm From Medial Instep 28 cm Vascular Assessment Pulses: Dorsalis Pedis Palpable: [Left:Yes] Electronic Signature(s) Signed: 03/13/2023 5:05:24 PM By: Karie Schwalbe RN Signed: 03/28/2023 7:59:22 AM By: Brenton Grills Entered By: Karie Schwalbe on 03/13/2023 11:17:02 -------------------------------------------------------------------------------- Multi Wound Chart Details Patient Name: Date of Service: [place], [name] 03/13/2023 10:15 A M Medical Record Number: [id number] Patient Account Number: [id number] Date of Birth/Sex: Treating RN: Mar 19, 1961 (62 y.o. 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 Ulcer Primary Etiology: Extremity Cataracts, Lymphedema, Asthma, Cataracts, Lymphedema, Asthma, Cataracts, Lymphedema, Asthma, Comorbid History: Congestive Heart Failure, Coronary Congestive Heart Failure, Coronary Congestive Heart Failure, Coronary Artery Disease, Hypertension, Artery  Disease, Hypertension, Artery Disease, Hypertension, Peripheral Arterial Disease, Peripheral Peripheral Arterial Disease, Peripheral Peripheral Arterial Disease, Peripheral Venous Disease, Type II Diabetes, Venous Disease, Type II Diabetes, Venous Disease, Type II Diabetes, Rheumatoid Arthritis, Neuropathy Rheumatoid Arthritis, Neuropathy Rheumatoid Arthritis, Neuropathy 05/17/2022 12/14/2022 12/14/2022 Date Acquired: 38 11 11 Weeks of Treatment: Open Open Open Wound Status: No No No Wound Recurrence: Yes Yes Yes Clustered Wound: 2 4 5  Clustered Quantity: 11x3.1x0.2 3.1x3x0.1 9.3x13x0.1 Measurements L x W x D (cm) 26.782 7.304 94.955 A (cm) : rea 5.356 0.73 9.495 Volume (cm) : -19.20% 61.30% -132.50% % Reduction in A rea: 40.40% 61.30% -132.50% % Reduction in Volume: Grade 2 Category/Stage II Category/Stage II Classification: Medium Medium Medium Exudate A mount: Serosanguineous Serosanguineous Serosanguineous Exudate Type: red, brown red, brown red, brown Exudate Color: Distinct, outline  attached N/A N/A Wound Margin: Large (67-100%) Large (67-100%) Large (67-100%) Granulation A mount: Pink Red Red Granulation Quality: Small (1-33%) Small (1-33%) Small (1-33%) Necrotic A mount: Adherent Slough Adherent [employer] Necrotic Tissue: Fat Layer (Subcutaneous Tissue): Yes Fat Layer (Subcutaneous Tissue): Yes Fat Layer (Subcutaneous Tissue): Yes Exposed Structures: Fascia: No Fascia: No Fascia: No Tendon: No Tendon: No Tendon: No Muscle: No Muscle: No Muscle: No Joint: No Joint: No Joint: No Bone: No Bone: No Bone: No None Medium (34-66%) None Epithelialization: Debridement - Selective/Open Wound Debridement - Selective/Open Wound Debridement - Selective/Open Wound Debridement: Pre-procedure Verification/Time Out 11:30 11:30 11:30 Taken: Lidocaine 4% Topical Solution Lidocaine 4% Topical Solution Lidocaine 4% Topical Solution Pain Control: [employer] Tissue Debrided: Non-Viable Tissue Non-Viable Tissue Non-Viable Tissue Level: 26.77 7.3 94.91 Debridement A (sq cm): rea Curette Curette Curette Instrument: Minimum Minimum Minimum Bleeding: Pressure Pressure Pressure Hemostasis A chieved: 0 0 0 Procedural Pain: 0 0 0 Post Procedural Pain: Procedure was tolerated well Procedure was tolerated well Procedure was tolerated well Debridement Treatment Response: 11x3.1x0.2 3.1x3x0.1 9.3x13x0.1 Post Debridement Measurements L x W x D (cm) 5.356 0.73 9.495 Post Debridement Volume: (cm) N/A Category/Stage II Category/Stage II Post Debridement Stage: Callus: Yes Scarring: Yes Scarring: Yes Periwound Skin Texture: Scarring: Yes Excoriation: No Induration: No Crepitus: No Rash: No Dry/Scaly: Yes No Abnormalities Noted No Abnormalities Noted Periwound Skin Moisture: Maceration: No Atrophie Blanche: No No Abnormalities Noted No Abnormalities Noted Periwound Skin Color: Cyanosis: No Ecchymosis: No Erythema: No Hemosiderin  Staining: No Mottled: No Pallor: No Rubor: No No Abnormality N/A No Abnormality Temperature: Debridement Debridement Debridement Procedures Performed: Wound Number: [id number]  Photos: Left, Medial Upper Leg Midline Umbilicus Abdomen - midline Wound Location: Morson, Letita M ([id number]) [id number] Page 4 of 20 Pressure Injury Other Lesion Other Lesion Wounding Event: Pressure Ulcer Abrasion Lesion Primary Etiology: Cataracts, Lymphedema, Asthma, Cataracts, Lymphedema, Asthma, Cataracts, Lymphedema, Asthma, Comorbid History: Congestive Heart Failure, CoronaryCongestive Heart Failure, Coronary Congestive Heart Failure, Coronary Artery Disease, Hypertension, Artery Disease, Hypertension, Artery Disease, Hypertension, Peripheral Arterial Disease, Peripheral Peripheral Arterial Disease, Peripheral Peripheral Arterial Disease, Peripheral Venous Disease, Type II Diabetes, Venous Disease, Type II Diabetes, Venous Disease, Type II Diabetes, Rheumatoid Arthritis, Neuropathy Rheumatoid Arthritis, Neuropathy Rheumatoid Arthritis, Neuropathy 12/14/2022 12/14/2022 12/14/2022 Date Acquired: 11 11 11  Weeks of Treatment: Open Open Open Wound Status: No No No Wound Recurrence: Yes Yes Yes Clustered Wound: 5 N/A 4 Clustered Quantity: 10x8.3x0.1 7x1.5x0.1 2.5x6x0.1 Measurements L x W x D (cm) 65.188 8.247 11.781 A (cm) : rea 6.519 0.825 1.178 Volume (cm) : -232.00% 82.50% -400.00% % Reduction in A rea: -232.10% 82.50% -399.20% % Reduction in Volume: Category/Stage II Full Thickness Without  Exposed Full Thickness Without Exposed Classification: Support Structures Support Structures Medium Medium Medium Exudate A mount: Serosanguineous Serosanguineous Serosanguineous Exudate Type: red, brown red, brown red, brown Exudate Color: N/A N/A N/A Wound Margin: Large (67-100%) Medium (34-66%) Large (67-100%) Granulation A mount: Red Pink Red Granulation  Quality: Small (1-33%) Medium (34-66%) Small (1-33%) Necrotic A mount: Adherent Slough N/A Eschar, Adherent Slough Necrotic Tissue: Fat Layer (Subcutaneous Tissue): Yes Fat Layer (Subcutaneous Tissue): Yes Fat Layer (Subcutaneous Tissue): Yes Exposed Structures: Fascia: No Fascia: No Fascia: No Tendon: No Tendon: No Tendon: No Muscle: No Muscle: No Muscle: No Joint: No Joint: No Joint: No Bone: No Bone: No Bone: No None Large (67-100%) None Epithelialization: Debridement - Selective/Open Wound Debridement - Selective/Open Wound Debridement - Selective/Open Wound Debridement: Pre-procedure Verification/Time Out 11:30 11:30 11:30 Taken: Lidocaine 4% Topical Solution Lidocaine 4% Topical Solution Lidocaine 4% Topical Solution Pain Control: Brewing technologist, [employer] Tissue Debrided: Non-Viable Tissue Non-Viable Tissue Non-Viable Tissue Level: 65.16 8.24 11.78 Debridement A (sq cm): rea Curette Curette Curette Instrument: Minimum Minimum Minimum Bleeding: Pressure Pressure Pressure Hemostasis A chieved: 0 0 0 Procedural Pain: 0 0 0 Post Procedural Pain: Procedure was tolerated well Procedure was tolerated well Procedure was tolerated well Debridement Treatment Response: 10x8.3x0.1 7x1.5x0.1 2.5x6x0.1 Post Debridement Measurements L x W x D (cm) 6.519 0.825 1.178 Post Debridement Volume: (cm) Category/Stage II N/A N/A Post Debridement Stage: Scarring: Yes No Abnormalities Noted No Abnormalities Noted Periwound Skin Texture: No Abnormalities Noted No Abnormalities Noted No Abnormalities Noted Periwound Skin Moisture: No Abnormalities Noted No Abnormalities Noted No Abnormalities Noted Periwound Skin Color: No Abnormality No Abnormality No Abnormality Temperature: Debridement Debridement Debridement Procedures Performed: Wound Number: [id number]  Photos: Left Abdomen - Lower Quadrant Right Abdomen - Lower Quadrant Left Breast Wound  Location: Gradually Appeared Gradually Appeared Gradually Appeared Wounding Event: Lesion Lesion Lesion Primary Etiology: Cataracts, Lymphedema, Asthma, Cataracts, Lymphedema, Asthma, Cataracts, Lymphedema, Asthma, Comorbid History: Congestive Heart Failure, Coronary Congestive Heart Failure, Coronary Congestive Heart Failure, Coronary Artery Disease, Hypertension, Artery Disease, Hypertension, Artery Disease, Hypertension, Peripheral Arterial Disease, Peripheral Peripheral Arterial Disease, Peripheral Peripheral Arterial Disease, Peripheral Venous Disease, Type II Diabetes, Venous Disease, Type II Diabetes, Venous Disease, Type II Diabetes, Rheumatoid Arthritis, Neuropathy Rheumatoid Arthritis, Neuropathy Rheumatoid Arthritis, Neuropathy 01/25/2023 01/25/2023 01/25/2023 Date Acquired: 6 6 6  Weeks of Treatment: Open Open Open Wound Status: No No No Wound Recurrence: Yes Yes Yes Clustered Wound: 5 3 N/A Clustered Quantity: 10x8x0.1 1x3x0.1 8.3x3.5x0.1 Measurements L x W x D (cm) Grose, Kasiah M ([id number]) [id number] Page 5 of 20 62.832 2.356 22.816 A (cm) : rea 6.283 0.236 2.282 Volume (cm) : -185.70% 84.60% -292.60% % Reduction in Area: -185.70% 84.60% -96.40% % Reduction in Volume: Full Thickness Without Exposed Full Thickness Without Exposed Full Thickness Without Exposed Classification: Support Structures Support Structures Support Structures Medium Medium Medium Exudate A mount: Serosanguineous Serosanguineous Serosanguineous Exudate Type: red, brown red, brown red, brown Exudate Color: N/A N/A N/A Wound Margin: Small (1-33%) Large (67-100%) Medium (34-66%) Granulation A mount: Red Red Red Granulation Quality: Large (67-100%) Small (1-33%) Medium (34-66%) Necrotic A mount: Adherent Slough Adherent [employer], Adherent Slough Necrotic Tissue: Fat Layer (Subcutaneous Tissue): Yes Fat Layer (Subcutaneous Tissue): Yes Fat Layer  (Subcutaneous Tissue): Yes Exposed Structures: Fascia: No Fascia: No Fascia: No Tendon: No Tendon: No Tendon: No Muscle: No Muscle: No Muscle: No Joint: No Joint: No Joint: No Bone: No Bone: No Bone: No None None None Epithelialization: Debridement - Selective/Open Wound Debridement - Selective/Open Wound  Debridement - Selective/Open Wound Debridement: Pre-procedure Verification/Time Out 11:30 11:30 11:30 Taken: Lidocaine 4% Topical Solution Lidocaine 4% Topical Solution Lidocaine 4% Topical Solution Pain Control: [facility], [employer] Tissue Debrided: Non-Viable Tissue Non-Viable Tissue Non-Viable Tissue Level: 62.8 2.36 22.8 Debridement A (sq cm): rea Curette Curette Curette Instrument: Minimum Minimum Minimum Bleeding: Pressure Pressure Pressure Hemostasis A chieved: 0 0 0 Procedural Pain: 0 0 0 Post Procedural Pain: Procedure was tolerated well Procedure was tolerated well Procedure was tolerated well Debridement Treatment Response: 10x8x0.1 1x3x0.1 8.3x3.5x0.1 Post Debridement Measurements L x W x D (cm) 6.283 0.236 2.282 Post Debridement Volume: (cm) N/A N/A N/A Post Debridement Stage: No Abnormalities Noted No Abnormalities Noted No Abnormalities Noted Periwound Skin Texture: Maceration: Yes No Abnormalities Noted No Abnormalities Noted Periwound Skin Moisture: No Abnormalities Noted No Abnormalities Noted No Abnormalities Noted Periwound Skin Color: No Abnormality No Abnormality No Abnormality Temperature: Debridement Debridement Debridement Procedures Performed: Treatment Notes Electronic Signature(s) Signed: 03/13/2023 11:47:29 AM By: Duanne Guess MD FACS Entered By: Duanne Guess on 03/13/2023 11:47:29 -------------------------------------------------------------------------------- Multi-Disciplinary Care Plan Details Patient Name: Date of Service: Wrightson, [name] 03/13/2023 10:15 A M Medical Record Number:  [id number] Patient Account Number: [id number] Date of Birth/Sex: Treating RN: 1961-01-15 (62 y.o. [name] Primary Care Vastie Douty: [name] Other Clinician: Referring Taylyn Brame: Treating Chrisa Hassan/Extender: Vivien Rossetti in Treatment: 33 Multidisciplinary Care Plan reviewed with physician Active Inactive Venous Leg Ulcer Nursing Diagnoses: Potential for venous Insuffiency (use before diagnosis confirmed) Goals: Patient will maintain optimal edema control Montelongo, Rigby M ([id number]) [id number] Page 6 of 20 Date Initiated: 07/19/2022 Target Resolution Date: 08/12/2026 Goal Status: Active Interventions: Assess peripheral edema status every visit. Treatment Activities: Therapeutic compression applied : 07/19/2022 Notes: Wound/Skin Impairment Nursing Diagnoses: Impaired tissue integrity Knowledge deficit related to ulceration/compromised skin integrity Goals: Patient/caregiver will verbalize understanding of skin care regimen Date Initiated: 11/14/2022 Target Resolution Date: 08/12/2026 Goal Status: Active Interventions: Assess patient/caregiver ability to obtain necessary supplies Assess patient/caregiver ability to perform ulcer/skin care regimen upon admission and as needed Assess ulceration(s) every visit Treatment Activities: Skin care regimen initiated : 11/14/2022 Topical wound management initiated : 11/14/2022 Notes: Electronic Signature(s) Signed: 03/13/2023 5:05:24 PM By: Karie Schwalbe RN Entered By: Karie Schwalbe on 03/13/2023 12:14:17 -------------------------------------------------------------------------------- Pain Assessment Details Patient Name: Date of Service: [place]wyhe, [name] 03/13/2023 10:15 A M Medical Record Number: [id number] Patient Account Number: [id number] Date of Birth/Sex: Treating RN: 1961-01-09 (62 y.o. Gevena Mart Primary Care Platon Arocho: [name] Other  Clinician: Referring Vadie Principato: Treating Ceria Suminski/Extender: Vivien Rossetti in Treatment: 33 Active Problems Location of Pain Severity and Description of Pain Patient Has Paino No Site Locations Rate the pain. Current Pain Level: 8 Shinsky, Jenise M ([id number]) [id number] Page 7 of 20 Pain Management and Medication Current Pain Management: Electronic Signature(s) Signed: 03/28/2023 7:59:22 AM By: Brenton Grills Entered By: Brenton Grills on 03/13/2023 10:39:50 -------------------------------------------------------------------------------- Patient/Caregiver Education Details Patient Name: Date of Service: Ramaker, [name] 7/2/2024andnbsp10:15 A M Medical Record Number: [id number] Patient Account Number: [id number] Date of Birth/Gender: Treating RN: [birth year] (62 y.o. Gevena Mart Primary Care Physician: [name] Other Clinician: Referring Physician: Treating Physician/Extender: Vivien Rossetti in Treatment: 64 Education Assessment Education Provided To: Patient and Caregiver Education Topics Provided Wound/Skin Impairment: Handouts: Caring for Your Ulcer Methods: Clinical cytogeneticist) Signed: 03/28/2023 7:59:22 AM By: Brenton Grills Entered By: Brenton Grills on 03/13/2023 11:16:25 -------------------------------------------------------------------------------- Wound Assessment Details Patient Name: Date of Service: Seavey, DA  [name] 03/13/2023 10:15 A M Medical Record Number: [id number] Patient Account Number: [id number] Date of Birth/Sex: Treating RN: March 24, 1961 (62 y.o. [name] Primary Care Zyier Dykema: [name] Other Clinician: Referring Gladyce Mcray: Treating Tawfiq Favila/Extender: Vivien Rossetti in Treatment: 33 Wound Status Wound Number: 19 Primary Diabetic Wound/Ulcer of the Lower Extremity Etiology: Wound Location: Left,  Plantar Foot Wound Open Wounding Event: Gradually Appeared Status: Date Acquired: 05/17/2022 Comorbid Cataracts, Lymphedema, Asthma, Congestive Heart Failure, Weeks Of Treatment: 33 History: Coronary Artery Disease, Hypertension, Peripheral Arterial Disease, Clustered Wound: Yes Peripheral Venous Disease, Type II Diabetes, Rheumatoid Arthritis, Neuropathy Photos Ernsberger, Rahcel M ([id number]) [id number] Page 8 of 20 Wound Measurements Length: (cm) 11 Width: (cm) 3.1 Depth: (cm) 0.2 Clustered Quantity: 2 Area: (cm) 26.782 Volume: (cm) 5.356 % Reduction in Area: -19.2% % Reduction in Volume: 40.4% Epithelialization: None Wound Description Classification: Grade 2 Wound Margin: Distinct, outline attached Exudate Amount: Medium Exudate Type: Serosanguineous Exudate Color: red, brown Foul Odor After Cleansing: No Slough/Fibrino Yes Wound Bed Granulation Amount: Large (67-100%) Exposed Structure Granulation Quality: Pink Fascia Exposed: No Necrotic Amount: Small (1-33%) Fat Layer (Subcutaneous Tissue) Exposed: Yes Necrotic Quality: Adherent Slough Tendon Exposed: No Muscle Exposed: No Joint Exposed: No Bone Exposed: No Periwound Skin Texture Texture Color No Abnormalities Noted: No No Abnormalities Noted: No Callus: Yes Atrophie Blanche: No Crepitus: No Cyanosis: No Excoriation: No Ecchymosis: No Induration: No Erythema: No Rash: No Hemosiderin Staining: No Scarring: Yes Mottled: No Pallor: No Moisture Rubor: No No Abnormalities Noted: Yes Temperature / Pain Temperature: No Abnormality Treatment Notes Wound #19 (Foot) Wound Laterality: Plantar, Left Cleanser Soap and Water Discharge Instruction: May shower and wash wound with dial antibacterial soap and water prior to dressing change. Wound Cleanser Discharge Instruction: Cleanse the wound with wound cleanser prior to applying a clean dressing using gauze sponges, not tissue or  cotton balls. Peri-Wound Care Topical Primary Dressing Maxorb Extra Ag+ Alginate Dressing, 4x4.75 (in/in) Discharge Instruction: Apply to wound bed as instructed Secondary Dressing ABD Pad, 5x9 Discharge Instruction: Apply over primary dressing as directed. Woven Gauze Sponge, Non-Sterile 4x4 in Discharge Instruction: Apply over primary dressing as directed. Huish, Dolly M ([id number]) [id number] Page 9 of 20 Secured With Elastic Bandage 4 inch (ACE bandage) Discharge Instruction: Secure with ACE bandage as directed. Kerlix Roll Sterile, 4.5x3.1 (in/yd) Discharge Instruction: Secure with Kerlix as directed. 35M Medipore Soft Cloth Surgical T 2x10 (in/yd) ape Discharge Instruction: Secure with tape as directed. Compression Wrap Compression Stockings Add-Ons Electronic Signature(s) Signed: 03/13/2023 5:05:24 PM By: Karie Schwalbe RN Signed: 03/28/2023 7:59:22 AM By: Brenton Grills Entered By: Brenton Grills on 03/13/2023 10:54:13 -------------------------------------------------------------------------------- Wound Assessment Details Patient Name: Date of Service: Schadt, [name] 03/13/2023 10:15 A M Medical Record Number: [id number] Patient Account Number: [id number] Date of Birth/Sex: Treating RN: [birth year] (62 y.o. [name] Primary Care Verneda Hollopeter: [name] Other Clinician: Referring Sheera Illingworth: Treating Arek Spadafore/Extender: Vivien Rossetti in Treatment: 33 Wound Status Wound Number: 20 Primary Pressure Ulcer Etiology: Wound Location: Right Amputation Site - Above Knee Wound Open Wounding Event: Pressure Injury Status: Date Acquired: 12/14/2022 Comorbid Cataracts, Lymphedema, Asthma, Congestive Heart Failure, Weeks Of Treatment: 11 History: Coronary Artery Disease, Hypertension, Peripheral Arterial Disease, Clustered Wound: Yes Peripheral Venous Disease, Type II Diabetes, Rheumatoid  Arthritis, Neuropathy Photos Wound Measurements Length: (cm) Width: (cm) Depth: (cm) Clustered Quantity: Area: (cm) Volume: (cm) 3.1 % Reduction in Area: 61.3% 3 % Reduction in Volume: 61.3% 0.1 Epithelialization: Medium (34-66%) 4 Tunneling: No 7.304  Undermining: No 0.73 Wound Description Classification: Category/Stage II Exudate Amount: Medium Exudate Type: Serosanguineous Exudate Color: red, brown Ahola, Missi M ([id number]) Wound Bed Granulation Amount: Large (67-100%) Granulation Quality: Red Necrotic Amount: Small (1-33%) Necrotic Quality: Adherent Slough Foul Odor After Cleansing: No Slough/Fibrino No [phone].pdf Page 10 of 20 Exposed Structure Fascia Exposed: No Fat Layer (Subcutaneous Tissue) Exposed: Yes Tendon Exposed: No Muscle Exposed: No Joint Exposed: No Bone Exposed: No Periwound Skin Texture Texture Color No Abnormalities Noted: No No Abnormalities Noted: Yes Scarring: Yes Moisture No Abnormalities Noted: Yes Treatment Notes Wound #20 (Amputation Site - Above Knee) Wound Laterality: Right Cleanser Peri-Wound Care Topical Primary Dressing Maxorb Extra Ag+ Alginate Dressing, 4x4.75 (in/in) Discharge Instruction: Apply to wound bed as instructed Secondary Dressing ABD Pad, 8x10 Discharge Instruction: Apply over primary dressing as directed. Secured With 210M Medipore H Soft Cloth Surgical T ape, 4 x 10 (in/yd) Discharge Instruction: Secure with tape as directed. Compression Wrap Compression Stockings Add-Ons Electronic Signature(s) Signed: 03/13/2023 5:05:24 PM By: Karie Schwalbe RN Signed: 03/28/2023 7:59:22 AM By: Brenton Grills Entered By: Brenton Grills on 03/13/2023 10:56:37 -------------------------------------------------------------------------------- Wound Assessment Details Patient Name: Date of Service: Rehfeldt, [name] 03/13/2023 10:15 A M Medical Record Number: [id number] Patient Account Number:  [id number] Date of Birth/Sex: Treating RN: 1961/04/12 (62 y.o. Gevena Mart Primary Care Amogh Komatsu: [name] Other Clinician: Referring Shirel Mallis: Treating Valta Dillon/Extender: Vivien Rossetti in Treatment: 33 Wound Status Wound Number: 21 Primary Pressure Ulcer Etiology: Wound Location: Medial Pubis Wound Open Wounding Event: Pressure Injury Status: Date Acquired: 12/14/2022 Comorbid Cataracts, Lymphedema, Asthma, Congestive Heart Failure, Weeks Of Treatment: 11 History: Coronary Artery Disease, Hypertension, Peripheral Arterial Disease, Clustered Wound: Yes Peripheral Venous Disease, Type II Diabetes, Rheumatoid Arthritis, Neuropathy Fahringer, Charlet M ([id number]) [id number] Page 11 of 20 Photos Wound Measurements Length: (cm) Width: (cm) Depth: (cm) Clustered Quantity: Area: (cm) Volume: (cm) 9.3 % Reduction in Area: -132.5% 13 % Reduction in Volume: -132.5% 0.1 Epithelialization: None 5 Tunneling: No 94.955 Undermining: No 9.495 Wound Description Classification: Category/Stage II Exudate Amount: Medium Exudate Type: Serosanguineous Exudate Color: red, brown Foul Odor After Cleansing: No Slough/Fibrino Yes Wound Bed Granulation Amount: Large (67-100%) Exposed Structure Granulation Quality: Red Fascia Exposed: No Necrotic Amount: Small (1-33%) Fat Layer (Subcutaneous Tissue) Exposed: Yes Necrotic Quality: Adherent Slough Tendon Exposed: No Muscle Exposed: No Joint Exposed: No Bone Exposed: No Periwound Skin Texture Texture Color No Abnormalities Noted: No No Abnormalities Noted: Yes Scarring: Yes Temperature / Pain Temperature: No Abnormality Moisture No Abnormalities Noted: Yes Treatment Notes Wound #21 (Pubis) Wound Laterality: Medial Cleanser Peri-Wound Care Zinc Oxide Ointment 30g tube Discharge Instruction: Apply Zinc Oxide to periwound with each dressing change Topical Primary  Dressing Secondary Dressing ABD Pad, 8x10 Discharge Instruction: Apply over primary dressing as directed. Secured With 210M Medipore H Soft Cloth Surgical T ape, 4 x 10 (in/yd) Discharge Instruction: Secure with tape as directed. Compression Wrap Compression Stockings Add-Ons Electronic Signature(s) Signed: 03/28/2023 7:59:22 AM By: Brenton Grills Pate, Lurline M ([id number]) [id number] Page 12 of 20 Entered By: Brenton Grills on 03/13/2023 10:59:06 -------------------------------------------------------------------------------- Wound Assessment Details Patient Name: Date of Service: Szabo, [name] 03/13/2023 10:15 A M Medical Record Number: [id number] Patient Account Number: [id number] Date of Birth/Sex: Treating RN: 04-08-1961 (62 y.o. Gevena Mart Primary Care Ashritha Desrosiers: [name] Other Clinician: Referring Asim Gersten: Treating Amma Crear/Extender: Vivien Rossetti in Treatment: 33 Wound Status Wound Number: 22 Primary Pressure Ulcer Etiology: Wound Location: Left, Medial Upper Leg Wound  Open Wounding Event: Pressure Injury Status: Date Acquired: 12/14/2022 Comorbid Cataracts, Lymphedema, Asthma, Congestive Heart Failure, Weeks Of Treatment: 11 History: Coronary Artery Disease, Hypertension, Peripheral Arterial Disease, Clustered Wound: Yes Peripheral Venous Disease, Type II Diabetes, Rheumatoid Arthritis, Neuropathy Photos Wound Measurements Length: (cm) 10 Width: (cm) 8.3 Depth: (cm) 0.1 Clustered Quantity: 5 Area: (cm) 65.188 Volume: (cm) 6.519 % Reduction in Area: -232% % Reduction in Volume: -232.1% Epithelialization: None Tunneling: No Undermining: No Wound Description Classification: Category/Stage II Exudate Amount: Medium Exudate Type: Serosanguineous Exudate Color: red, brown Foul Odor After Cleansing: No Slough/Fibrino Yes Wound Bed Granulation Amount: Large (67-100%) Exposed  Structure Granulation Quality: Red Fascia Exposed: No Necrotic Amount: Small (1-33%) Fat Layer (Subcutaneous Tissue) Exposed: Yes Necrotic Quality: Adherent Slough Tendon Exposed: No Muscle Exposed: No Joint Exposed: No Bone Exposed: No Periwound Skin Texture Texture Color No Abnormalities Noted: No No Abnormalities Noted: Yes Scarring: Yes Temperature / Pain Temperature: No Abnormality Moisture No Abnormalities Noted: Yes Treatment Notes Shortridge, Lawsyn M ([id number]) [id number] Page 13 of 20 Wound #22 (Upper Leg) Wound Laterality: Left, Medial Cleanser Peri-Wound Care Topical Primary Dressing Secondary Dressing Secured With Compression Wrap Compression Stockings Add-Ons Electronic Signature(s) Signed: 03/28/2023 7:59:22 AM By: Brenton Grills Entered By: Brenton Grills on 03/13/2023 11:15:21 -------------------------------------------------------------------------------- Wound Assessment Details Patient Name: Date of Service: Gouger, [name] 03/13/2023 10:15 A M Medical Record Number: [id number] Patient Account Number: [id number] Date of Birth/Sex: Treating RN: Nov 10, 1960 (62 y.o. [name] Primary Care Jakaiya Netherland: [name] Other Clinician: Referring Arlette Schaad: Treating Onyx Edgley/Extender: Vivien Rossetti in Treatment: 33 Wound Status Wound Number: 23 Primary Abrasion Etiology: Wound Location: Midline Umbilicus Wound Open Wounding Event: Other Lesion Status: Date Acquired: 12/14/2022 Comorbid Cataracts, Lymphedema, Asthma, Congestive Heart Failure, Weeks Of Treatment: 11 History: Coronary Artery Disease, Hypertension, Peripheral Arterial Disease, Clustered Wound: Yes Peripheral Venous Disease, Type II Diabetes, Rheumatoid Arthritis, Neuropathy Photos Wound Measurements Length: (cm) 7 Width: (cm) 1.5 Depth: (cm) 0.1 Area: (cm) 8.247 Volume: (cm) 0.825 % Reduction in Area: 82.5% % Reduction  in Volume: 82.5% Epithelialization: Large (67-100%) Wound Description Classification: Full Thickness Without Exposed Support Exudate Amount: Medium Exudate Type: Serosanguineous Exudate Color: red, brown Conaty, Thaila M ([id number]) Wound Bed Granulation Amount: Medium (34-66% Granulation Quality: Pink Necrotic Amount: Medium (34-66% Structures Foul Odor After Cleansing: No Slough/Fibrino Yes [id number].pdf Page 14 of 20 ) Exposed Structure Fascia Exposed: No ) Fat Layer (Subcutaneous Tissue) Exposed: Yes Tendon Exposed: No Muscle Exposed: No Joint Exposed: No Bone Exposed: No Periwound Skin Texture Texture Color No Abnormalities Noted: Yes No Abnormalities Noted: Yes Moisture Temperature / Pain No Abnormalities Noted: Yes Temperature: No Abnormality Treatment Notes Wound #23 (Umbilicus) Wound Laterality: Midline Cleanser Peri-Wound Care Topical Primary Dressing Maxorb Extra Ag+ Alginate Dressing, 4x4.75 (in/in) Discharge Instruction: Apply to wound bed as instructed Secondary Dressing ABD Pad, 8x10 Discharge Instruction: Apply over primary dressing as directed. Secured With 53M Medipore H Soft Cloth Surgical T ape, 4 x 10 (in/yd) Discharge Instruction: Secure with tape as directed. Compression Wrap Compression Stockings Add-Ons Electronic Signature(s) Signed: 03/13/2023 5:05:24 PM By: Karie Schwalbe RN Signed: 03/28/2023 7:59:22 AM By: Brenton Grills Entered By: Brenton Grills on 03/13/2023 11:08:35 -------------------------------------------------------------------------------- Wound Assessment Details Patient Name: Date of Service: Hector, [name] 03/13/2023 10:15 A M Medical Record Number: [id number] Patient Account Number: [id number] Date of Birth/Sex: Treating RN: 1960-12-24 (62 y.o. Gevena Mart Primary Care Delbra Zellars: [name] Other Clinician: Referring Faviola Klare: Treating Lodema Parma/Extender: Vivien Rossetti in Treatment:  33 Wound Status Wound Number: 24 Primary Lesion Etiology: Wound Location: Abdomen - midline Wound Open Wounding Event: Other Lesion Status: Date Acquired: 12/14/2022 Notes: PANNUS Weeks Of Treatment: 11 Comorbid Cataracts, Lymphedema, Asthma, Congestive Heart Failure, Clustered Wound: Yes History: Coronary Artery Disease, Hypertension, Peripheral Arterial Disease, Peripheral Venous Disease, Type II Diabetes, Rheumatoid Arthritis, Neuropathy Stavely, Cydni M ([id number]) [id number] Page 15 of 20 Photos Wound Measurements Length: (cm) Width: (cm) Depth: (cm) Clustered Quantity: Area: (cm) Volume: (cm) 2.5 % Reduction in Area: -400% 6 % Reduction in Volume: -399.2% 0.1 Epithelialization: None 4 Tunneling: No 11.781 Undermining: No 1.178 Wound Description Classification: Full Thickness Without Exposed Sup Exudate Amount: Medium Exudate Type: Serosanguineous Exudate Color: red, brown port Structures Foul Odor After Cleansing: No Slough/Fibrino Yes Wound Bed Granulation Amount: Large (67-100%) Exposed Structure Granulation Quality: Red Fascia Exposed: No Necrotic Amount: Small (1-33%) Fat Layer (Subcutaneous Tissue) Exposed: Yes Necrotic Quality: Eschar, Adherent Slough Tendon Exposed: No Muscle Exposed: No Joint Exposed: No Bone Exposed: No Periwound Skin Texture Texture Color No Abnormalities Noted: Yes No Abnormalities Noted: Yes Moisture Temperature / Pain No Abnormalities Noted: Yes Temperature: No Abnormality Treatment Notes Wound #24 (Abdomen - midline) Cleanser Peri-Wound Care Topical Santyl Collagenase Ointment, 30 (gm), tube Discharge Instruction: Apply to crusty area Primary Dressing Maxorb Extra Ag+ Alginate Dressing, 4x4.75 (in/in) Discharge Instruction: Apply to wound bed as instructed Secondary Dressing ABD Pad, 8x10 Discharge Instruction: Apply over primary dressing as directed. Secured  With 39M Medipore H Soft Cloth Surgical T ape, 4 x 10 (in/yd) Discharge Instruction: Secure with tape as directed. Compression Wrap Compression Stockings Add-Ons Electronic Signature(s) Schaumburg, Livian M ([id number]) [id number] Page 16 of 20 Signed: 03/28/2023 7:59:22 AM By: Brenton Grills Entered By: Brenton Grills on 03/13/2023 11:09:55 -------------------------------------------------------------------------------- Wound Assessment Details Patient Name: Date of Service: Dauphinee, [name] 03/13/2023 10:15 A M Medical Record Number: [id number] Patient Account Number: [id number] Date of Birth/Sex: Treating RN: December 12, 1960 (62 y.o. Gevena Mart Primary Care Idora Brosious: [name] Other Clinician: Referring Zael Shuman: Treating Chakara Bognar/Extender: Vivien Rossetti in Treatment: 33 Wound Status Wound Number: 25 Primary Lesion Etiology: Wound Location: Left Abdomen - Lower Quadrant Wound Open Wounding Event: Gradually Appeared Status: Date Acquired: 01/25/2023 Comorbid Cataracts, Lymphedema, Asthma, Congestive Heart Failure, Weeks Of Treatment: 6 History: Coronary Artery Disease, Hypertension, Peripheral Arterial Disease, Clustered Wound: Yes Peripheral Venous Disease, Type II Diabetes, Rheumatoid Arthritis, Neuropathy Photos Wound Measurements Length: (cm) Width: (cm) Depth: (cm) Clustered Quantity: Area: (cm) Volume: (cm) 10 % Reduction in Area: -185.7% 8 % Reduction in Volume: -185.7% 0.1 Epithelialization: None 5 Tunneling: No 62.832 Undermining: No 6.283 Wound Description Classification: Full Thickness Without Exposed Sup Exudate Amount: Medium Exudate Type: Serosanguineous Exudate Color: red, brown port Structures Foul Odor After Cleansing: No Slough/Fibrino Yes Wound Bed Granulation Amount: Small (1-33%) Exposed Structure Granulation Quality: Red Fascia Exposed: No Necrotic Amount: Large (67-100%) Fat  Layer (Subcutaneous Tissue) Exposed: Yes Necrotic Quality: Adherent Slough Tendon Exposed: No Muscle Exposed: No Joint Exposed: No Bone Exposed: No Periwound Skin Texture Texture Color No Abnormalities Noted: Yes No Abnormalities Noted: Yes Moisture Temperature / Pain No Abnormalities Noted: Yes Temperature: No Abnormality Kadrmas, Cambre M ([id number]) [id number] Page 17 of 20 Treatment Notes Wound #25 (Abdomen - Lower Quadrant) Wound Laterality: Left Cleanser Peri-Wound Care Topical Primary Dressing Secondary Dressing Secured With Compression Wrap Compression Stockings Add-Ons Electronic Signature(s) Signed: 03/28/2023 7:59:22 AM By: Brenton Grills Entered By: Brenton Grills on 03/13/2023 11:12:02 -------------------------------------------------------------------------------- Wound Assessment Details Patient Name: Date of Service:  Crance, [name] 03/13/2023 10:15 A M Medical Record Number: [id number] Patient Account Number: [id number] Date of Birth/Sex: Treating RN: [birth year] (62 y.o. Gevena Mart Primary Care Jazman Reuter: [name] Other Clinician: Referring Jameelah Watts: Treating Tekela Garguilo/Extender: Vivien Rossetti in Treatment: 33 Wound Status Wound Number: 26 Primary Lesion Etiology: Wound Location: Right Abdomen - Lower Quadrant Wound Open Wounding Event: Gradually Appeared Status: Date Acquired: 01/25/2023 Comorbid Cataracts, Lymphedema, Asthma, Congestive Heart Failure, Weeks Of Treatment: 6 History: Coronary Artery Disease, Hypertension, Peripheral Arterial Disease, Clustered Wound: Yes Peripheral Venous Disease, Type II Diabetes, Rheumatoid Arthritis, Neuropathy Photos Wound Measurements Length: (cm) Width: (cm) Depth: (cm) Clustered Quantity: Area: (cm) Volume: (cm) 1 % Reduction in Area: 84.6% 3 % Reduction in Volume: 84.6% 0.1 Epithelialization: None 3 Tunneling: No 2.356 Undermining:  No 0.236 Wound Description Classification: Full Thickness Without Exposed Support Structures Exudate Amount: Medium Gaughan, Etty M ([id number]) Exudate Type: Serosanguineous Exudate Color: red, brown Foul Odor After Cleansing: No Slough/Fibrino Yes [phone].pdf Page 18 of 20 Wound Bed Granulation Amount: Large (67-100%) Exposed Structure Granulation Quality: Red Fascia Exposed: No Necrotic Amount: Small (1-33%) Fat Layer (Subcutaneous Tissue) Exposed: Yes Necrotic Quality: Adherent Slough Tendon Exposed: No Muscle Exposed: No Joint Exposed: No Bone Exposed: No Periwound Skin Texture Texture Color No Abnormalities Noted: Yes No Abnormalities Noted: Yes Moisture Temperature / Pain No Abnormalities Noted: Yes Temperature: No Abnormality Treatment Notes Wound #26 (Abdomen - Lower Quadrant) Wound Laterality: Right Cleanser Peri-Wound Care Nystop Powder (Nystatin) Discharge Instruction: Apply Nystop (Nystatin) Powder as instructed Zinc Oxide Ointment 30g tube Discharge Instruction: Apply Zinc Oxide to periwound with each dressing change Topical Primary Dressing Maxorb Extra Ag+ Alginate Dressing, 4x4.75 (in/in) Discharge Instruction: Apply to wound bed as instructed Secondary Dressing ABD Pad, 8x10 Discharge Instruction: Apply over primary dressing as directed. Secured With 22M Medipore H Soft Cloth Surgical T ape, 4 x 10 (in/yd) Discharge Instruction: Secure with tape as directed. Compression Wrap Compression Stockings Add-Ons Electronic Signature(s) Signed: 03/28/2023 7:59:22 AM By: Brenton Grills Entered By: Brenton Grills on 03/13/2023 11:07:28 -------------------------------------------------------------------------------- Wound Assessment Details Patient Name: Date of Service: Rumple, [name] 03/13/2023 10:15 A M Medical Record Number: [id number] Patient Account Number: [id number] Date of Birth/Sex: Treating RN: September 02, 1961 (62 y.o. Gevena Mart Primary Care Chalene Treu: [name] Other Clinician: Referring Zaki Gertsch: Treating Yulissa Needham/Extender: Vivien Rossetti in Treatment: 33 Wound Status Wound Number: 27 Primary Lesion Etiology: Wound Location: Left Breast Wound Open Wounding Event: Gradually Appeared Fluharty, Taylr M ([id number]) [id number] Page 19 of 20 Wounding Event: Gradually Appeared Status: Date Acquired: 01/25/2023 Comorbid Cataracts, Lymphedema, Asthma, Congestive Heart Failure, Weeks Of Treatment: 6 History: Coronary Artery Disease, Hypertension, Peripheral Arterial Disease, Clustered Wound: Yes Peripheral Venous Disease, Type II Diabetes, Rheumatoid Arthritis, Neuropathy Photos Wound Measurements Length: (cm) 8.3 Width: (cm) 3.5 Depth: (cm) 0.1 Area: (cm) 22.816 Volume: (cm) 2.282 % Reduction in Area: -292.6% % Reduction in Volume: -96.4% Epithelialization: None Tunneling: No Undermining: No Wound Description Classification: Full Thickness Without Exposed Suppor Exudate Amount: Medium Exudate Type: Serosanguineous Exudate Color: red, brown t Structures Foul Odor After Cleansing: No Slough/Fibrino Yes Wound Bed Granulation Amount: Medium (34-66%) Exposed Structure Granulation Quality: Red Fascia Exposed: No Necrotic Amount: Medium (34-66%) Fat Layer (Subcutaneous Tissue) Exposed: Yes Necrotic Quality: Eschar, Adherent Slough Tendon Exposed: No Muscle Exposed: No Joint Exposed: No Bone Exposed: No Periwound Skin Texture Texture Color No Abnormalities Noted: Yes No Abnormalities Noted: Yes Moisture Temperature / Pain No Abnormalities Noted: Yes Temperature: No  Abnormality Treatment Notes Wound #27 (Breast) Wound Laterality: Left Cleanser Peri-Wound Care Topical Santyl Collagenase Ointment, 30 (gm), tube Discharge Instruction: Apply to the crusty wound on left breast Primary Dressing Maxorb Extra Ag+ Alginate  Dressing, 4x4.75 (in/in) Discharge Instruction: Apply to wound bed as instructed Secondary Dressing ABD Pad, 8x10 Discharge Instruction: Apply over primary dressing as directed. Secured With 15M Medipore H Soft Cloth Surgical T ape, 4 x 10 (in/yd) Discharge Instruction: Secure with tape as directed. Compression Wrap Landrigan, Carollyn M ([id number]) [id number] Page 20 of 20 Compression Stockings Add-Ons Electronic Signature(s) Signed: 03/28/2023 7:59:22 AM By: Brenton Grills Entered By: Brenton Grills on 03/13/2023 11:05:44 -------------------------------------------------------------------------------- Vitals Details Patient Name: Date of Service: Deardorff, [name] 03/13/2023 10:15 A M Medical Record Number: [id number] Patient Account Number: [id number] Date of Birth/Sex: Treating RN: 08-May-1961 (62 y.o. Gevena Mart Primary Care Giomar Gusler: [name] Other Clinician: Referring Jerolyn Flenniken: Treating Lance Huaracha/Extender: Vivien Rossetti in Treatment: 33 Vital Signs Time Taken: 10:38 Temperature (F): 98.2 Height (in): 62 Pulse (bpm): 86 Weight (lbs): 284 Respiratory Rate (breaths/min): 18 Body Mass Index (BMI): 51.9 Blood Pressure (mmHg): 134/80 Reference Range: 80 - 120 mg / dl Electronic Signature(s) Signed: 03/28/2023 7:59:22 AM By: Brenton Grills Entered By: Brenton Grills on 03/13/2023 10:39:04

## 2023-04-02 ENCOUNTER — Telehealth: Payer: Commercial Managed Care - HMO

## 2023-04-03 ENCOUNTER — Telehealth: Payer: Self-pay | Admitting: Family Medicine

## 2023-04-03 ENCOUNTER — Other Ambulatory Visit: Payer: Self-pay | Admitting: Family Medicine

## 2023-04-03 NOTE — Telephone Encounter (Signed)
Received Refill request for Klor-Con/EF 25 MEQ FRTAB Medication is not on her current list.

## 2023-04-05 ENCOUNTER — Encounter: Payer: Commercial Managed Care - HMO | Admitting: Registered Nurse

## 2023-04-06 ENCOUNTER — Other Ambulatory Visit: Payer: Self-pay | Admitting: Physical Medicine and Rehabilitation

## 2023-04-06 ENCOUNTER — Encounter: Payer: Commercial Managed Care - HMO | Admitting: Registered Nurse

## 2023-04-06 ENCOUNTER — Other Ambulatory Visit: Payer: Self-pay | Admitting: Family Medicine

## 2023-04-06 ENCOUNTER — Other Ambulatory Visit: Payer: Self-pay | Admitting: Cardiology

## 2023-04-06 DIAGNOSIS — E559 Vitamin D deficiency, unspecified: Secondary | ICD-10-CM

## 2023-04-09 ENCOUNTER — Encounter (HOSPITAL_BASED_OUTPATIENT_CLINIC_OR_DEPARTMENT_OTHER): Payer: Commercial Managed Care - HMO | Admitting: General Surgery

## 2023-04-09 ENCOUNTER — Ambulatory Visit: Payer: Commercial Managed Care - HMO | Admitting: Registered Nurse

## 2023-04-11 ENCOUNTER — Encounter: Payer: Commercial Managed Care - HMO | Admitting: Registered Nurse

## 2023-04-12 ENCOUNTER — Other Ambulatory Visit: Payer: Self-pay | Admitting: Family Medicine

## 2023-04-12 ENCOUNTER — Encounter: Payer: Commercial Managed Care - HMO | Attending: Registered Nurse | Admitting: Registered Nurse

## 2023-04-12 ENCOUNTER — Encounter: Payer: Self-pay | Admitting: Registered Nurse

## 2023-04-12 VITALS — BP 139/75 | HR 85 | Ht 62.0 in

## 2023-04-12 DIAGNOSIS — M25512 Pain in left shoulder: Secondary | ICD-10-CM

## 2023-04-12 DIAGNOSIS — G894 Chronic pain syndrome: Secondary | ICD-10-CM | POA: Insufficient documentation

## 2023-04-12 DIAGNOSIS — Z89611 Acquired absence of right leg above knee: Secondary | ICD-10-CM | POA: Diagnosis present

## 2023-04-12 DIAGNOSIS — M2550 Pain in unspecified joint: Secondary | ICD-10-CM

## 2023-04-12 DIAGNOSIS — M25511 Pain in right shoulder: Secondary | ICD-10-CM

## 2023-04-12 DIAGNOSIS — M7062 Trochanteric bursitis, left hip: Secondary | ICD-10-CM

## 2023-04-12 DIAGNOSIS — G8929 Other chronic pain: Secondary | ICD-10-CM

## 2023-04-12 DIAGNOSIS — M545 Low back pain, unspecified: Secondary | ICD-10-CM | POA: Diagnosis present

## 2023-04-12 DIAGNOSIS — M7061 Trochanteric bursitis, right hip: Secondary | ICD-10-CM

## 2023-04-12 MED ORDER — LIDOCAINE 5 % EX PTCH: 1.0000 | MEDICATED_PATCH | Freq: Two times a day (BID) | CUTANEOUS | 4 refills | Status: AC

## 2023-04-12 MED ORDER — OXYCODONE HCL 20 MG PO TABS: 1.0000 | ORAL_TABLET | Freq: Three times a day (TID) | ORAL | 0 refills | Status: DC | PRN

## 2023-04-12 MED ORDER — ONETOUCH VERIO REFLECT W/DEVICE KIT: PACK | 0 refills | Status: DC

## 2023-04-12 NOTE — Progress Notes (Signed)
Subjective:    Patient ID: [name], female    DOB: 06-Dec-1960, 62 y.o.   MRN: [id number]  HPI: [name] is a 62 y.o. female who returns for follow up appointment for chronic pain and medication refill. She states her pain is located in her bilateral shoulders,lower back, left knee pain and left foot pain. She rates her pain 8. Her current exercise regime is walking and performing stretching exercises.  [name] Morphine equivalent is 90.00 MME.   Last Oral Swab was Performed on 11/28/2022, it was consistent.      Pain Inventory Average Pain 8 Pain Right Now 8 My pain is constant, sharp, burning, dull, stabbing, tingling, and aching  In the last 24 hours, has pain interfered with the following? General activity 10 Relation with others 10 Enjoyment of life 10 What TIME of day is your pain at its worst? morning  and night Sleep (in general) Poor  Pain is worse with: walking, bending, sitting, inactivity, standing, unsure, and some activites Pain improves with: rest, heat/ice, medication, TENS, and injections Relief from Meds: 7  Family History  Problem Relation Age of Onset   CAD Mother    Hypertension Mother    Heart attack Mother    Stroke Mother    CAD Father    Heart attack Father    Allergic rhinitis Father    Asthma Father    Hypertension Brother    Hypertension Brother    Pancreatic cancer Paternal Aunt    Breast cancer Paternal Aunt    Lung cancer Paternal Aunt    Allergic rhinitis Son    Asthma Son    Social History   Socioeconomic History   Marital status: Married    Spouse name: [name]   Number of children: 1   Years of education: 11   Highest education level: GED or equivalent  Occupational History    Comment: Not currrently working ( last worked 2004)  Tobacco Use   Smoking status: Former    Current packs/day: 0.00    Average packs/day: 1 pack/day for 19.4 years (19.4 ttl pk-yrs)    Types: Cigarettes    Start date: 13     Quit date: 02/11/2004    Years since quitting: 19.1    Passive exposure: Past   Smokeless tobacco: Never  Vaping Use   Vaping status: Never Used  Substance and Sexual Activity   Alcohol use: Yes    Comment: RARE Wine   Drug use: Not Currently    Types: Marijuana    Comment: "only in my teens"   Sexual activity: Yes    Birth control/protection: Surgical    Comment: Hysterectomy  Other Topics Concern   Not on file  Social History Narrative   Not on file   Social Determinants of Health   Financial Resource Strain: Low Risk  (12/27/2021)   Overall Financial Resource Strain (CARDIA)    Difficulty of Paying Living Expenses: Not hard at all  Food Insecurity: No Food Insecurity (06/14/2022)   Hunger Vital Sign    Worried About Running Out of Food in the Last Year: Never true    Ran Out of Food in the Last Year: Never true  Transportation Needs: No Transportation Needs (06/14/2022)   PRAPARE - Administrator, Civil Service (Medical): No    Lack of Transportation (Non-Medical): No  Physical Activity: Inactive (08/25/2021)   Exercise Vital Sign    Days of Exercise per Week: 0  days    Minutes of Exercise per Session: 0 min  Stress: Stress Concern Present (08/25/2021)   [employer] of Occupational Health - Occupational Stress Questionnaire    Feeling of Stress : Very much  Social Connections: Unknown (01/22/2022)   Received from [facility]   Social Network    Social Network: Not on file   Past Surgical History:  Procedure Laterality Date   ABDOMINAL HYSTERECTOMY  06/2005   "w/right ovariy"   AMPUTATION Right 01/28/2020    RIGHT FOURTH AND FIFTH RAY AMPUTATION   AMPUTATION Right 01/28/2020   Procedure: RIGHT FOURTH AND FIFTH RAY AMPUTATION,;  Surgeon: [name], MD;  Location: [facility] OR;  Service: Orthopedics;  Laterality: Right;   AMPUTATION Right 06/01/2021   Procedure: RIGHT BELOW KNEE AMPUTATION;  Surgeon: [name], MD;  Location: [facility] OR;   Service: Orthopedics;  Laterality: Right;   AMPUTATION Right 07/22/2021   Procedure: RIGHT ABOVE KNEE AMPUTATION;  Surgeon: [name], MD;  Location: [facility] OR;  Service: Orthopedics;  Laterality: Right;   APPENDECTOMY     BREAST BIOPSY Bilateral    9 total (04/18/2018)   CORONARY ANGIOPLASTY WITH STENT PLACEMENT  10/01/2015   normal LM, 30% LAD, 85% mid RCA and 95% distal RCA s/p PCI of the mid to distal RCA and now on DAPT with ASA and Ticagrelor.     CORONARY STENT INTERVENTION Right 04/18/2018   Procedure: CORONARY STENT INTERVENTION;  Surgeon: [name], MD;  Location: [facility] INVASIVE CV LAB;  Service: Cardiovascular;  Laterality: Right;   DILATION AND CURETTAGE OF UTERUS     FRACTURE SURGERY     LAPAROSCOPIC CHOLECYSTECTOMY     LEFT HEART CATH AND CORONARY ANGIOGRAPHY N/A 04/18/2018   Procedure: LEFT HEART CATH AND CORONARY ANGIOGRAPHY;  Surgeon: [name], MD;  Location: [facility];  Service: Cardiovascular;  Laterality: N/A;   MUSCLE BIOPSY Left    "leg"   OOPHORECTOMY  07/2010   RADIOLOGY WITH ANESTHESIA N/A 05/25/2016   Procedure: RADIOLOGY WITH ANESTHESIA;  Surgeon: Medication Radiologist, MD;  Location: [facility] OR;  Service: Radiology;  Laterality: N/A;   STUMP REVISION Right 06/29/2021   Procedure: REVISION RIGHT BELOW KNEE AMPUTATION;  Surgeon: [name], MD;  Location: [facility] OR;  Service: Orthopedics;  Laterality: Right;   TEE WITHOUT CARDIOVERSION N/A 06/01/2021   Procedure: TRANSESOPHAGEAL ECHOCARDIOGRAM (TEE);  Surgeon: Sande Rives, MD;  Location: [facility] OR;  Service: Cardiovascular;  Laterality: N/A;   TONSILLECTOMY AND ADENOIDECTOMY     WRIST FRACTURE SURGERY Left    "crushed it"   Past Surgical History:  Procedure Laterality Date   ABDOMINAL HYSTERECTOMY  06/2005   "w/right ovariy"   AMPUTATION Right 01/28/2020    RIGHT FOURTH AND FIFTH RAY AMPUTATION   AMPUTATION Right 01/28/2020   Procedure: RIGHT FOURTH AND FIFTH RAY AMPUTATION,;   Surgeon: [name], MD;  Location: [facility] OR;  Service: Orthopedics;  Laterality: Right;   AMPUTATION Right 06/01/2021   Procedure: RIGHT BELOW KNEE AMPUTATION;  Surgeon: [name], MD;  Location: [facility] OR;  Service: Orthopedics;  Laterality: Right;   AMPUTATION Right 07/22/2021   Procedure: RIGHT ABOVE KNEE AMPUTATION;  Surgeon: [name], MD;  Location: [facility] OR;  Service: Orthopedics;  Laterality: Right;   APPENDECTOMY     BREAST BIOPSY Bilateral    9 total (04/18/2018)   CORONARY ANGIOPLASTY WITH STENT PLACEMENT  10/01/2015   normal LM, 30% LAD, 85% mid RCA  and 95% distal RCA s/p PCI of the mid to distal RCA and now on DAPT with ASA and Ticagrelor.     CORONARY STENT INTERVENTION Right 04/18/2018   Procedure: CORONARY STENT INTERVENTION;  Surgeon: [name], MD;  [facility];  Service: Cardiovascular;  Laterality: Right;   DILATION AND CURETTAGE OF UTERUS     FRACTURE SURGERY     LAPAROSCOPIC CHOLECYSTECTOMY     LEFT HEART CATH AND CORONARY ANGIOGRAPHY N/A 04/18/2018   Procedure: LEFT HEART CATH AND CORONARY ANGIOGRAPHY;  Surgeon: [name], MD;  [facility]: [facility];  Service: Cardiovascular;  Laterality: N/A;   MUSCLE BIOPSY Left    "leg"   OOPHORECTOMY  07/2010   RADIOLOGY WITH ANESTHESIA N/A 05/25/2016   Procedure: RADIOLOGY WITH ANESTHESIA;  Surgeon: Medication Radiologist, MD;  Location: [facility] OR;  Service: Radiology;  Laterality: N/A;   STUMP REVISION Right 06/29/2021   Procedure: REVISION RIGHT BELOW KNEE AMPUTATION;  Surgeon: [name], MD;  Location: [facility] OR;  Service: Orthopedics;  Laterality: Right;   TEE WITHOUT CARDIOVERSION N/A 06/01/2021   Procedure: TRANSESOPHAGEAL ECHOCARDIOGRAM (TEE);  Surgeon: Sande Rives, MD;  Location: [facility] OR;  Service: Cardiovascular;  Laterality: N/A;   TONSILLECTOMY AND ADENOIDECTOMY     WRIST FRACTURE SURGERY Left    "crushed it"   Past Medical History:  Diagnosis Date   Aortic  stenosis    mild AS by echo 02/2021   Asthma    "on daily RX and rescue inhaler" (04/18/2018)   Chronic diastolic CHF (congestive heart failure) (HCC) 09/2015   Chronic lower back pain    Chronic neck pain    Chronic pain syndrome    Fentanyl Patch   Colon polyps    Coronary artery disease    cath with normal LM, 30% LAD, 85% mid RCA and 95% distal RCA s/p PCI of the mid to distal RCA and now on DAPT with ASA and Ticagrelor.     Eczema    Excessive daytime sleepiness 11/26/2015   Fibromyalgia    Gallstones    GERD (gastroesophageal reflux disease)    Heart murmur    "noted for the 1st time on 04/18/2018"   History of blood transfusion 07/2010   "S/P oophorectomy"   History of gout    History of hiatal hernia 1980s   "gone now" (04/18/2018)   Hyperlipidemia    Hypertension    takes Metoprolol and Enalapril daily   Hypothyroidism    takes Synthroid daily   IBS (irritable bowel syndrome)    Migraine    "nothing in the 2000s" (04/18/2018)   Mixed connective tissue disease (HCC)    NAFLD (nonalcoholic fatty liver disease)    Pneumonia    "several times" (04/18/2018)   PVC's (premature ventricular contractions)    noted on event monitor 02/2021   Rheumatoid arthritis (HCC)    "hands, elbows, shoulders, probably knees" (04/18/2018)   Scoliosis    Sjogren's syndrome (HCC)    Sleep apnea    mild - does not use cpap   Spondylosis    Type II diabetes mellitus (HCC)    takes Metformin and Hum R daily (04/18/2018)   Walker as ambulation aid    also uses wheelchair   There were no vitals taken for this visit.  Opioid Risk Score:   Fall Risk Score:  `1  Depression screen [facility] 2/9     01/30/2023   10:04 AM 01/01/2023    9:58  AM 11/28/2022    9:45 AM 08/22/2022   11:21 AM 07/07/2022    9:16 AM 06/27/2022   11:39 AM 05/02/2022   11:56 AM  Depression screen PHQ 2/9  Decreased Interest 1 1 1 1  0 1 1  Down, Depressed, Hopeless 1 1 1 1 1 1 1   PHQ - 2 Score 2 2 2 2 1 2 2     Review of  Systems  Musculoskeletal:  Positive for back pain and gait problem.       Pain in foot, shoulders, knee, phantom pain, hands  & elbows  All other systems reviewed and are negative.     Objective:   Physical Exam Vitals and nursing note reviewed.  Constitutional:      Appearance: Normal appearance.  Cardiovascular:     Rate and Rhythm: Normal rate and regular rhythm.     Pulses: Normal pulses.     Heart sounds: Normal heart sounds.  Pulmonary:     Effort: Pulmonary effort is normal.     Breath sounds: Normal breath sounds.  Musculoskeletal:     Cervical back: Normal range of motion and neck supple.     Comments: Normal Muscle Bulk and Muscle Testing Reveals:  Upper Extremities: Decreased ROM  90 Degrees and Muscle Strength  5/5 Bilateral AC Joint Tenderness Thoracic and Lumbar Hypersensitivity Left Greater Trochanter Tenderness Lower Extremities : Right AKA Left Lower Extremity: Decreased ROM and Muscle Strength 3/5 Left Lower Extremity Flexion Produces Pain into her Left Lower Extremity and Left Hip Arrived in wheelchair     Skin:    General: Skin is warm and dry.  Neurological:     Mental Status: She is alert and oriented to person, place, and time.  Psychiatric:        Mood and Affect: Mood normal.        Behavior: Behavior normal.         Assessment & Plan:  HX: of Right AKA: Dr Lajoyce Corners Following. Continue to Monitor. 04/12/2023 Diabetic Polyneuropathy associated with Type 2 DM: Continue current medication regimen with Lyrica. Continue to Monitor. 04/12/2023 DM Type 2: PCP Following. Continue current medication regimen. Continue to Monitor. 04/12/2023 Chronic  Pain Syndrome : Refilled: Oxycodone 20 mg one tablet every 8 hours as needed for pain #90.  We will continue the opioid monitoring program, this consists of regular clinic visits, examinations, urine drug screen, pill counts as well as use of [place] Controlled Substance Reporting system. A 12 month History  has been reviewed on the [place] Controlled Substance Reporting System on 04/12/2023 Stage 1 Decubitus: Left Buttock and Left Lower Extremity: Continue Adequate Nutrition and Weight Shifting.  Wound Care following. Continue to Monitor. 04/12/2023  6. Cellulitis: Ms. Shakoor was hospitalized on 06/14/2022- 06/20/2022, discharge summary was reviewed.  Continue to Monitpr. PCP Following.04/12/2023 7. Phantom Pain: Continue Lyrica. Continue to Monitor. 04/12/2023 8. Polyarthralgia: Continue HEP as Tolerated. Continue to Monitor. 04/12/2023 9. Chronic Bilateral Shoulder Pain: Continue HEP as tolerated. Continue to Monitor.04/12/2023  10. Chronic Bilateral Lower Back Pain without Sciatica: Continue Lidocaine Patches 12 hours on and 12 hours off, she verbalizes understanding. Continue HEP as tolerated. Continue current medication regimen. Continue to Monitor. 04/12/2023 11. Greater Trochanter Bursitis:No complaints today.  Continue to Alternate Ice and Heat Therapy. Continue to Monitor. 04/12/2023 12. Left Foot Ulcer: Husband performing dressing changes. Wound Care Following and Dr Lajoyce Corners Following. Continue to Monitor. 04/12/2023  F/U with Dr Carlis Abbott

## 2023-04-20 ENCOUNTER — Encounter: Payer: Self-pay | Admitting: Family Medicine

## 2023-04-20 ENCOUNTER — Telehealth: Payer: Commercial Managed Care - HMO | Admitting: Family Medicine

## 2023-04-20 ENCOUNTER — Telehealth: Payer: Self-pay | Admitting: Family Medicine

## 2023-04-20 DIAGNOSIS — L03116 Cellulitis of left lower limb: Secondary | ICD-10-CM

## 2023-04-20 MED ORDER — PROMETHAZINE HCL 25 MG PO TABS
25.0000 mg | ORAL_TABLET | Freq: Three times a day (TID) | ORAL | 2 refills | Status: DC | PRN
Start: 2023-04-20 — End: 2024-06-06

## 2023-04-20 MED ORDER — DOXYCYCLINE HYCLATE 100 MG PO TABS: 100.0000 mg | ORAL_TABLET | Freq: Two times a day (BID) | ORAL | 0 refills | Status: DC

## 2023-04-20 MED ORDER — FLUCONAZOLE 150 MG PO TABS: ORAL_TABLET | ORAL | 0 refills | Status: DC

## 2023-04-20 NOTE — Telephone Encounter (Signed)
Pt called and stated she thinks she has cellulitis in her leg. She requeted for an antibiotic so I offered to schedule her an appt with Dr. Carmelia Roller next week or with another provider if she did not want to wait. Pt declined  and requested to speak with Zella Ball on what to do. Please adivse pt.

## 2023-04-20 NOTE — Telephone Encounter (Signed)
Called and schedule a mychart video visit at 4:15 today.

## 2023-04-20 NOTE — Progress Notes (Signed)
Chief Complaint  Patient presents with   Cellulitis    [name] is a 62 y.o. female here for a skin complaint. We are interacting via web portal for an electronic face-to-face visit. I verified patient's ID using 2 identifiers. Patient agreed to proceed with visit via this method. Patient is at home, I am at office. Patient and I are present for visit.   Duration: 3 days Location: LLE Pruritic? No Painful? Yes Drainage? No New soaps/lotions/topicals/detergents? No Redness? Yes Other associated symptoms: + fevers 102 F, nausea Therapies tried thus far: phenergan for associated nausea, lidocaine, cold compresses  Past Medical History:  Diagnosis Date   Aortic stenosis    mild AS by echo 02/2021   Asthma    "on daily RX and rescue inhaler" (04/18/2018)   Chronic diastolic CHF (congestive heart failure) (HCC) 09/2015   Chronic lower back pain    Chronic neck pain    Chronic pain syndrome    Fentanyl Patch   Colon polyps    Coronary artery disease    cath with normal LM, 30% LAD, 85% mid RCA and 95% distal RCA s/p PCI of the mid to distal RCA and now on DAPT with ASA and Ticagrelor.     Eczema    Excessive daytime sleepiness 11/26/2015   Fibromyalgia    Gallstones    GERD (gastroesophageal reflux disease)    Heart murmur    "noted for the 1st time on 04/18/2018"   History of blood transfusion 07/2010   "S/P oophorectomy"   History of gout    History of hiatal hernia 1980s   "gone now" (04/18/2018)   Hyperlipidemia    Hypertension    takes Metoprolol and Enalapril daily   Hypothyroidism    takes Synthroid daily   IBS (irritable bowel syndrome)    Migraine    "nothing in the 2000s" (04/18/2018)   Mixed connective tissue disease (HCC)    NAFLD (nonalcoholic fatty liver disease)    Pneumonia    "several times" (04/18/2018)   PVC's (premature ventricular contractions)    noted on event monitor 02/2021   Rheumatoid arthritis (HCC)    "hands, elbows, shoulders, probably knees"  (04/18/2018)   Scoliosis    Sjogren's syndrome (HCC)    Sleep apnea    mild - does not use cpap   Spondylosis    Type II diabetes mellitus (HCC)    takes Metformin and Hum R daily (04/18/2018)   Walker as ambulation aid    also uses wheelchair   Objective No conversational dyspnea Age appropriate judgment and insight Nml affect and mood  Cellulitis of left lower extremity - Plan: doxycycline (VIBRA-TABS) 100 MG tablet, fluconazole (DIFLUCAN) 150 MG tablet, promethazine (PHENERGAN) 25 MG tablet  10 d of doxy, diflucan prn. Phenergan for assoc nausea.  Despite fever and associated nausea, she looks and sounds well enough to try to treat this outpatient.  Told pt if she does not improve, may need to go to ER for possible IV abx.  F/u prn. The patient voiced understanding and agreement to the plan.  Jilda Roche [place], DO 04/20/23 10:17 AM

## 2023-04-24 ENCOUNTER — Encounter: Payer: Commercial Managed Care - HMO | Admitting: Physical Medicine and Rehabilitation

## 2023-04-26 ENCOUNTER — Other Ambulatory Visit: Payer: Self-pay | Admitting: Family Medicine

## 2023-04-26 ENCOUNTER — Other Ambulatory Visit: Payer: Self-pay | Admitting: Cardiology

## 2023-04-26 ENCOUNTER — Encounter (HOSPITAL_BASED_OUTPATIENT_CLINIC_OR_DEPARTMENT_OTHER): Payer: Commercial Managed Care - HMO | Attending: General Surgery | Admitting: General Surgery

## 2023-04-26 DIAGNOSIS — L97112 Non-pressure chronic ulcer of right thigh with fat layer exposed: Secondary | ICD-10-CM | POA: Diagnosis not present

## 2023-04-26 DIAGNOSIS — L98492 Non-pressure chronic ulcer of skin of other sites with fat layer exposed: Secondary | ICD-10-CM | POA: Insufficient documentation

## 2023-04-26 DIAGNOSIS — L97423 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: Secondary | ICD-10-CM | POA: Insufficient documentation

## 2023-04-26 DIAGNOSIS — E1151 Type 2 diabetes mellitus with diabetic peripheral angiopathy without gangrene: Secondary | ICD-10-CM | POA: Diagnosis not present

## 2023-04-26 DIAGNOSIS — Z6841 Body Mass Index (BMI) 40.0 and over, adult: Secondary | ICD-10-CM | POA: Insufficient documentation

## 2023-04-26 DIAGNOSIS — Z89611 Acquired absence of right leg above knee: Secondary | ICD-10-CM | POA: Diagnosis not present

## 2023-04-26 DIAGNOSIS — E11621 Type 2 diabetes mellitus with foot ulcer: Secondary | ICD-10-CM | POA: Insufficient documentation

## 2023-04-26 DIAGNOSIS — I110 Hypertensive heart disease with heart failure: Secondary | ICD-10-CM | POA: Diagnosis not present

## 2023-04-26 DIAGNOSIS — I890 Lymphedema, not elsewhere classified: Secondary | ICD-10-CM | POA: Diagnosis not present

## 2023-04-26 DIAGNOSIS — Z87892 Personal history of anaphylaxis: Secondary | ICD-10-CM | POA: Diagnosis not present

## 2023-04-26 DIAGNOSIS — L97822 Non-pressure chronic ulcer of other part of left lower leg with fat layer exposed: Secondary | ICD-10-CM | POA: Diagnosis not present

## 2023-04-26 DIAGNOSIS — I5032 Chronic diastolic (congestive) heart failure: Secondary | ICD-10-CM | POA: Insufficient documentation

## 2023-04-26 DIAGNOSIS — L304 Erythema intertrigo: Secondary | ICD-10-CM | POA: Insufficient documentation

## 2023-04-26 DIAGNOSIS — M3500 Sicca syndrome, unspecified: Secondary | ICD-10-CM | POA: Diagnosis not present

## 2023-04-26 DIAGNOSIS — M869 Osteomyelitis, unspecified: Secondary | ICD-10-CM | POA: Insufficient documentation

## 2023-04-26 DIAGNOSIS — E1169 Type 2 diabetes mellitus with other specified complication: Secondary | ICD-10-CM | POA: Insufficient documentation

## 2023-04-27 ENCOUNTER — Other Ambulatory Visit: Payer: Self-pay | Admitting: Physical Medicine and Rehabilitation

## 2023-04-27 ENCOUNTER — Other Ambulatory Visit: Payer: Self-pay | Admitting: Family Medicine

## 2023-04-29 NOTE — Progress Notes (Unsigned)
Cardiology Office Note:  .   Date:  05/01/2023  ID:  [name], DOB 1960-10-14, MRN [id number] PCP: Sharlene Dory, DO  [facility] Providers Cardiologist:  Armanda Magic, MD    Patient Profile: .      PMH CAD Cath> [facility] 09/2015 Normal LM, 30% LAD, 85% mRCA, 95% dRCA s/p PCI of mid to distal RCA PCI/DES mid and distal RCA LHC 04/18/2018 Mid RCA 10%, distal RCA 10%, ostial RCA 99% PTCA/DES x 1 to [facility] Ostial LAD to proximal LAD 30% Proximal Cx to mid Cx 20% Patent stents mid and distal RCA with minimal restenosis PAF Hyperlipidemia Hypertension Chronic HFpEF Morbid obesity Fibromyalgia with chronic pain Type 2 DM Chronic venous stasis dermatitis RA with mixed connective tissue disease Right AKA  Admission to [facility] after stress test done for atypical chest pain which showed ischemia January 2017.  She established care with Dr. Mayford Knife 11/2015.  Had recently worn a heart monitor for palpitations which was "normal."  Repeat cath for chest pain 04/2018 which showed severe stenosis of ostial RCA 99% successfully treated with PCI/DES.  Distal details outlined above.  Was placed on DAPT with ASA and Plavix.  Also continued on BB and ARB.  Was referred to lipid clinic for consideration of PCSK9 inhibitor but that was not followed through.  History of A-fib RVR in the setting of sepsis and bacteremia.  She converted to NSR and was placed on Lopressor but no anticoagulation.  Her CHA2DS2-VASc score was 5 but due to brief nature of A-fib in the setting of acute infection and respiratory failure it was recommended to hold off on anticoagulation unless there were further episodes.  Her beta-blocker was changed to Cardizem due to wheezing and asthma.  Event monitor July 2022 showed no evidence of A-fib.   Admission 12/2021 with acute on chronic congestive heart failure with volume overload in the setting of medication noncompliance and left LE cellulitis.   Valsartan was discontinued due to hyperkalemia.  Seen May 2023 by heart failure clinic and was doing well.  She was continued on Lasix 40 mg daily, Farxiga 10 mg daily, and spironolactone 25 mg daily.  Last cardiology clinic visit was 04/25/2022 with Dr. Mayford Knife via telemedicine. She reported at that time recent RA flare. She reported DOE abut felt it was 2/2 asthma and stable. Feels fluttering in her chest but only if she gets anxious. Continued to report statin intolerance. She was referred back to lipid clinic. She was advised to return for in person visit in 6 months.        History of Present Illness: Marland Kitchen   [name] is a pleasant 62 y.o. female who is here with her husband for follow-up. She is s/p right AKA and travels in a wheelchair. 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 She reports occasional sharp chest pains in her chest that last for a few seconds. She has chronic shortness of breath and recently more coughing. Feels phlegm moving around in her chest. It is difficult for her to discern if her SOB and chest discomfort are coming from her heart or lungs. Also on multiple medications that she feels may be contributing. Goes to sleep on 2 firm pillows, but sometimes wakes up flat on the bed. Does not specifically describe PND but wakes up multiple times during the night due to pain. Husband has never heard her snoring or gasping. She has chronic  cellulitis in the LLE. She only ambulates to and from the bathroom.   ROS: See HPI       Studies Reviewed: Marland Kitchen   EKG Interpretation Date/Time:  Monday April 30 2023 15:04:19 EDT Ventricular Rate:  97 PR Interval:  216 QRS Duration:  86 QT Interval:  372 QTC Calculation: 472 R Axis:   -46  Text Interpretation: Sinus rhythm with 1st degree A-V block with frequent Premature ventricular complexes Left axis deviation Low voltage QRS Nonspecific ST abnormality When compared with ECG of 09-Jan-2022 09:17, Premature  ventricular complexes are now Present Confirmed by Eligha Bridegroom [phone]) on 05/01/2023 5:12:11 AM     Risk Assessment/Calculations:             Physical Exam:   VS:  BP 130/60   Pulse 97   Wt 297 lb (134.7 kg)   SpO2 96%   BMI 54.32 kg/m    Wt Readings from Last 3 Encounters:  04/30/23 297 lb (134.7 kg)  11/28/22 284 lb (128.8 kg)  08/01/22 284 lb (128.8 kg)    GEN: Obese, chronically ill appearing in no acute distress NECK: No JVD; No carotid bruits CARDIAC: Distant heart sounds, RRR, no murmurs, rubs, gallops RESPIRATORY:  Clear to auscultation without rales, wheezing or rhonchi  ABDOMEN: Soft, non-tender, protuberant EXTREMITIES:  Rt AKA, left LE swollen and erythematous with chronic appearing skin changes, partially covered by dressing    ASSESSMENT AND PLAN: .    CAD: History of stents to mid and distal RCA in 2017. DES to ostial RCA in 2019, patent stents with minimal ISR, residual disease as outline above. She is having some chest discomfort and shortness of breath. She is mostly sedentary so difficult to discern if symptoms worsen with exertion. Also has hx of asthma and likely obesity hypoventilation. Chest discomfort is not associated with dyspnea, nausea or diaphoresis. EKG does not reveal any concerning ST changes today. We will try increasing Imdur to 60 mg daily for additional anti-anginal benefit. She did not tolerate beta blocker in the past due to wheezing. I will see her back in 3 months to consider additional medication titration or further ischemia evaluation.   Shortness of breath: Chronic shortness of breath that she feels is overall stable. Likely multifactorial in the setting of obesity, deconditioning, asthma, CAD and likely obesity hypoventilation. As noted above, we will trial higher dose of nitrates and see her back in soon follow-up.   Dyslipidemia: LDL 112 on 01/08/23. Does not specifically recall symptoms/intolerance of specific statins. She is willing  to try PCSK9i. We will check pricing to determine if it is financially feasible. Consider retrial of low dose statin therapy.   Chronic HFpEF: Normal LVEF on echo 05/2021. She has chronic LLE edema. Volume status is difficult to assess due to body habitus. Difficult to discern if she has orthopnea or PND due to frequent waking during the night due to pain. No obvious signs of respiratory distress according to her husband. We will continue Lasix 40 mg daily. She did not tolerate Farxiga due to yeast infection.   Morbid obesity: BMI of 54 makes management of chronic disease challenging. She is mostly immobile due to hx of right BKA and chronic wounds in LLE. She reports eating smaller portions to try to lose weight. Most meals are cooked by her husband. Encouraged continued efforts towards weight loss and upper body exercise with hand weights.   Hypertension: BP is well controlled.   PAF: She has frequent PVCs on  exam. No reoccurrence of a fib to her awareness. She is not on OAC. Continue to monitor for return of a fib. Continue diltiazem.        Dispo: 3 months with me  Signed, Eligha Bridegroom, NP-C

## 2023-04-30 ENCOUNTER — Ambulatory Visit: Payer: Commercial Managed Care - HMO | Attending: Cardiology | Admitting: Nurse Practitioner

## 2023-04-30 ENCOUNTER — Encounter: Payer: Self-pay | Admitting: Nurse Practitioner

## 2023-04-30 VITALS — BP 130/60 | HR 97 | Wt 297.0 lb

## 2023-04-30 DIAGNOSIS — I5032 Chronic diastolic (congestive) heart failure: Secondary | ICD-10-CM | POA: Diagnosis not present

## 2023-04-30 DIAGNOSIS — I480 Paroxysmal atrial fibrillation: Secondary | ICD-10-CM

## 2023-04-30 DIAGNOSIS — I2510 Atherosclerotic heart disease of native coronary artery without angina pectoris: Secondary | ICD-10-CM | POA: Diagnosis not present

## 2023-04-30 DIAGNOSIS — I10 Essential (primary) hypertension: Secondary | ICD-10-CM | POA: Diagnosis not present

## 2023-04-30 DIAGNOSIS — E785 Hyperlipidemia, unspecified: Secondary | ICD-10-CM

## 2023-04-30 DIAGNOSIS — I493 Ventricular premature depolarization: Secondary | ICD-10-CM

## 2023-04-30 DIAGNOSIS — R0602 Shortness of breath: Secondary | ICD-10-CM

## 2023-04-30 MED ORDER — ISOSORBIDE MONONITRATE ER 60 MG PO TB24: 60.0000 mg | ORAL_TABLET | Freq: Every day | ORAL | 3 refills | Status: DC

## 2023-04-30 NOTE — Patient Instructions (Signed)
Medication Instructions:   INCREASE Imdur one (1) tablet by mouth ( 60 mg) daily.   *If you need a refill on your cardiac medications before your next appointment, please call your pharmacy*   Lab Work:  None ordered.  If you have labs (blood work) drawn today and your tests are completely normal, you will receive your results only by: MyChart Message (if you have MyChart) OR A paper copy in the mail If you have any lab test that is abnormal or we need to change your treatment, we will call you to review the results.   Testing/Procedures:  None ordered.   Follow-Up: At [facility], you and your health needs are our priority.  As part of our continuing mission to provide you with exceptional heart care, we have created designated Provider Care Teams.  These Care Teams include your primary Cardiologist (physician) and Advanced Practice Providers (APPs -  Physician Assistants and Nurse Practitioners) who all work together to provide you with the care you need, when you need it.  We recommend signing up for the patient portal called "MyChart".  Sign up information is provided on this After Visit Summary.  MyChart is used to connect with patients for Virtual Visits (Telemedicine).  Patients are able to view lab/test results, encounter notes, upcoming appointments, etc.  Non-urgent messages can be sent to your provider as well.   To learn more about what you can do with MyChart, go to [email or web address].    Your next appointment:   3 month(s)  Provider:   Eligha Bridegroom, NP

## 2023-05-01 ENCOUNTER — Telehealth: Payer: Self-pay

## 2023-05-01 ENCOUNTER — Encounter: Payer: Self-pay | Admitting: Nurse Practitioner

## 2023-05-01 ENCOUNTER — Other Ambulatory Visit (HOSPITAL_COMMUNITY): Payer: Self-pay

## 2023-05-01 MED ORDER — REPATHA 140 MG/ML ~~LOC~~ SOSY: 140.0000 mg | PREFILLED_SYRINGE | SUBCUTANEOUS | 12 refills | Status: DC

## 2023-05-01 NOTE — Telephone Encounter (Signed)
S/w pt is Aware Repatha got approved for a 0 copay and was sent in to requested pharmacy. Pt was advised to fast at next appointment. Fasting labs added to pt's upcoming appt notes.

## 2023-05-01 NOTE — Telephone Encounter (Signed)
Pharmacy Patient Advocate Encounter  Received notification from [employer] that Prior Authorization for REPATHA has been APPROVED from 05/01/23 to 04/30/24. Ran test claim, Copay is $0. This test claim was processed through [facility]- copay amounts may vary at other pharmacies due to pharmacy/plan contracts, or as the patient moves through the different stages of their insurance plan.

## 2023-05-01 NOTE — Telephone Encounter (Signed)
Please notify patient that Repatha has been ordered and approved at $0 copay. I would like to recheck her cholesterol when she comes to next appointment if she could please come in fasting.

## 2023-05-01 NOTE — Addendum Note (Signed)
Addended by: Levi Aland on: 05/01/2023 03:25 PM   Modules accepted: Orders

## 2023-05-01 NOTE — Telephone Encounter (Signed)
Pharmacy Patient Advocate Encounter   Received notification from Physician's Office that prior authorization for REPATHA is required/requested.   Insurance verification completed.   The patient is insured through [employer] .   Per test claim: PA required; PA submitted to [employer] via CoverMyMeds Key/confirmation #/[id number] Status is pending

## 2023-05-03 ENCOUNTER — Ambulatory Visit: Payer: Commercial Managed Care - HMO | Admitting: Family Medicine

## 2023-05-08 ENCOUNTER — Ambulatory Visit (INDEPENDENT_AMBULATORY_CARE_PROVIDER_SITE_OTHER): Payer: Commercial Managed Care - HMO | Admitting: Family Medicine

## 2023-05-08 ENCOUNTER — Encounter: Payer: Self-pay | Admitting: Family Medicine

## 2023-05-08 VITALS — BP 128/70 | HR 96 | Temp 98.7°F | Wt 292.0 lb

## 2023-05-08 DIAGNOSIS — E1165 Type 2 diabetes mellitus with hyperglycemia: Secondary | ICD-10-CM | POA: Diagnosis not present

## 2023-05-08 DIAGNOSIS — Z794 Long term (current) use of insulin: Secondary | ICD-10-CM

## 2023-05-08 LAB — COMPREHENSIVE METABOLIC PANEL
ALT: 18 U/L (ref 0–35)
AST: 22 U/L (ref 0–37)
Albumin: 3.2 g/dL — ABNORMAL LOW (ref 3.5–5.2)
Alkaline Phosphatase: 77 U/L (ref 39–117)
BUN: 14 mg/dL (ref 6–23)
CO2: 30 mEq/L (ref 19–32)
Calcium: 8.6 mg/dL (ref 8.4–10.5)
Chloride: 99 mEq/L (ref 96–112)
Creatinine, Ser: 0.71 mg/dL (ref 0.40–1.20)
GFR: 91.49 mL/min (ref >60.00)
Glucose, Bld: 184 mg/dL — ABNORMAL HIGH (ref 70–99)
Potassium: 3.9 mEq/L (ref 3.5–5.1)
Sodium: 139 mEq/L (ref 135–145)
Total Bilirubin: 0.2 mg/dL (ref 0.2–1.2)
Total Protein: 6.3 g/dL (ref 6.0–8.3)

## 2023-05-08 LAB — HEMOGLOBIN A1C: Hgb A1c MFr Bld: 9.7 % — ABNORMAL HIGH (ref 4.6–6.5)

## 2023-05-08 LAB — LIPID PANEL
Cholesterol: 147 mg/dL (ref 0–200)
HDL: 39.1 mg/dL (ref >39.00)
LDL Cholesterol: 79 mg/dL (ref 0–99)
NonHDL: 107.7
Total CHOL/HDL Ratio: 4
Triglycerides: 142 mg/dL (ref 0.0–149.0)
VLDL: 28.4 mg/dL (ref 0.0–40.0)

## 2023-05-08 MED ORDER — GLUCAGON EMERGENCY 1 MG IJ KIT
1.0000 mg | PACK | Freq: Every day | INTRAMUSCULAR | 1 refills | Status: DC | PRN
Start: 2023-05-08 — End: 2023-10-10

## 2023-05-08 MED ORDER — MUPIROCIN 2 % EX OINT: TOPICAL_OINTMENT | Freq: Two times a day (BID) | CUTANEOUS | 1 refills | Status: DC

## 2023-05-08 MED ORDER — DEXCOM G7 SENSOR MISC: 3 refills | Status: DC

## 2023-05-08 NOTE — Progress Notes (Signed)
Applin, Adhira [name] ([id number]) [id number] Page 1 of 20 Visit Report for 04/26/2023 Arrival Information Details Patient Name: Date of Service: [name], [name] 04/26/2023 10:15 A [name] Medical Record Number: [id number] Patient Account Number: [id number] Date of Birth/Sex: Treating RN: [birth year] (62 y.o. Gevena Mart Primary Care Bartosz Luginbill: [name] Other Clinician: Referring Cortana Vanderford: Treating Icel Castles/Extender: Vivien Rossetti in Treatment: 40 Visit Information History Since Last Visit All ordered tests and consults were completed: Yes Patient Arrived: Wheel Chair Added or deleted any medications: No Arrival Time: 10:30 Any new allergies or adverse reactions: No Accompanied By: spouse Had a fall or experienced change in No Transfer Assistance: EasyPivot Patient Lift activities of daily living that may affect Patient Identification Verified: Yes risk of falls: Secondary Verification Process Completed: Yes Signs or symptoms of abuse/neglect since last visito No Patient Requires Transmission-Based Precautions: No Hospitalized since last visit: No Patient Has Alerts: No Implantable device outside of the clinic excluding No cellular tissue based products placed in the center since last visit: Has Dressing in Place as Prescribed: Yes Pain Present Now: No Electronic Signature(s) Signed: 05/08/2023 2:03:55 PM By: Brenton Grills Entered By: Brenton Grills on 04/26/2023 10:31:34 -------------------------------------------------------------------------------- Encounter Discharge Information Details Patient Name: Date of Service: [name], [name] 04/26/2023 10:15 A [name] Medical Record Number: [id number] Patient Account Number: [id number] Date of Birth/Sex: Treating RN: 1961-06-12 (62 y.o. Gevena Mart Primary Care Haden Cavenaugh: [name] Other Clinician: Referring Adithi Gammon: Treating Dade Rodin/Extender: Vivien Rossetti in Treatment: 40 Encounter Discharge Information Items Post Procedure Vitals Discharge Condition: Stable Temperature (F): 98.6 Ambulatory Status: Wheelchair Pulse (bpm): 84 Discharge Destination: Home Respiratory Rate (breaths/min): 20 Transportation: Private Auto Blood Pressure (mmHg): 126/64 Accompanied By: spouse Schedule Follow-up Appointment: Yes Clinical Summary of Care: Patient Declined Electronic Signature(s) Signed: 05/08/2023 2:03:55 PM By: Brenton Grills Entered By: Brenton Grills on 04/26/2023 11:59:36 [name], [name] ([id number]) [id number] Page 2 of 20 -------------------------------------------------------------------------------- Lower Extremity Assessment Details Patient Name: Date of Service: [name] 04/26/2023 10:15 A [name] Medical Record Number: [id number] Patient Account Number: [id number] Date of Birth/Sex: Treating RN: 1961-08-28 (62 y.o. Gevena Mart Primary Care Dalanie Kisner: [name] Other Clinician: Referring Kyros Salzwedel: Treating Kenston Longton/Extender: Vivien Rossetti in Treatment: 40 Edema Assessment Assessed: Kyra Searles: No] [Right: No] Edema: [Left: Ye] [Right: s] Calf Left: Right: Point of Measurement: 30 cm From Medial Instep 48 cm Ankle Left: Right: Point of Measurement: 9 cm From Medial Instep 28 cm Vascular Assessment Pulses: Dorsalis Pedis Palpable: [Left:Yes] Extremity colors, hair growth, and conditions: Extremity Color: [Left:Normal] Electronic Signature(s) Signed: 05/08/2023 2:03:55 PM By: Brenton Grills Entered By: Brenton Grills on 04/26/2023 10:34:51 -------------------------------------------------------------------------------- Multi Wound Chart Details Patient Name: Date of Service: [name] 04/26/2023 10:15 A [name] Medical Record Number: [id number] Patient Account Number: [id number] Date of Birth/Sex: Treating RN: 03-14-1961  (62 y.o. 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 Primary Etiology: Extremity Cataracts, Lymphedema, Asthma, Cataracts, Lymphedema, Asthma, Cataracts, Lymphedema, Asthma, Comorbid History: Congestive Heart Failure, Coronary Congestive Heart Failure, Coronary Congestive Heart Failure, Coronary Artery Disease, Hypertension, Artery  Disease, Hypertension, Artery Disease, Hypertension, Peripheral Arterial Disease, Peripheral Peripheral Arterial Disease, Peripheral Peripheral Arterial Disease, Peripheral Venous Disease, Type II Diabetes, Venous Disease, Type II Diabetes, Venous Disease, Type II Diabetes, Rheumatoid Arthritis, Neuropathy Rheumatoid Arthritis, Neuropathy Rheumatoid Arthritis, Neuropathy 05/17/2022 12/14/2022 12/14/2022 Date Acquired: 40 17 17 Weeks of Treatment: Open Open Open Wound Status: No No No Wound Recurrence: Yes Yes Yes Clustered Wound: 2 4 5  Clustered Quantity: 12x3.8x0.2 1.9x1.9x0.1 9.3x10.2x0.1 Measurements L x [name] x D (cm) 35.814 2.835 74.503 A (cm) : rea 7.163 0.284 7.45 Volume (cm) : -59.40% 85.00% -82.40% % Reduction in A rea: 20.30% 84.90% -82.40% % Reduction in Volume: Grade 2 Category/Stage II Category/Stage II Classification: Medium Medium Medium Exudate A mount: Serosanguineous Serosanguineous Serosanguineous Exudate Type: red, brown red, brown red, brown Exudate  Color: Distinct, outline attached N/A N/A Wound Margin: Large (67-100%) Large (67-100%) Large (67-100%) Granulation A mount: Pink Red Red Granulation Quality: Small (1-33%) Small (1-33%) Small (1-33%) Necrotic A mount: Adherent Slough Adherent [employer] Necrotic Tissue: Fat Layer (Subcutaneous Tissue): Yes Fat Layer (Subcutaneous Tissue): Yes Fat Layer (Subcutaneous Tissue): Yes Exposed Structures: Fascia: No Fascia: No Fascia: No Tendon: No Tendon: No Tendon: No Muscle: No Muscle: No Muscle: No Joint: No Joint: No Joint: No Bone: No Bone: No Bone: No None Medium (34-66%) None Epithelialization: Debridement - Selective/Open Wound Debridement - Selective/Open Wound Debridement - Selective/Open Wound Debridement: Pre-procedure Verification/Time Out 11:14 11:14 11:14 Taken: Lidocaine 4% Topical Solution Lidocaine 4% Topical Solution Lidocaine 4% Topical Solution Pain Control: [employer] Tissue Debrided: Non-Viable Tissue Non-Viable Tissue Non-Viable Tissue Level: 35.8 2.83 74.47 Debridement A (sq cm): rea Curette Curette Curette Instrument: Minimum Minimum Minimum Bleeding: Pressure Pressure Pressure Hemostasis A chieved: 0 0 0 Procedural Pain: 0 0 0 Post Procedural Pain: Procedure was tolerated well Procedure was tolerated well Procedure was tolerated well Debridement Treatment Response: 12x3.8x0.2 1.9x1.9x0.1 9.3x10.2x0.1 Post Debridement Measurements L x [name] x D (cm) 7.163 0.284 7.45 Post Debridement Volume: (cm) N/A Category/Stage II Category/Stage II Post Debridement Stage: Callus: Yes Scarring: Yes Scarring: Yes Periwound Skin Texture: Scarring: Yes Excoriation: No Induration: No Crepitus: No Rash: No Dry/Scaly: Yes No Abnormalities Noted No Abnormalities Noted Periwound Skin Moisture: Maceration: No Atrophie Blanche: No No Abnormalities Noted No Abnormalities Noted Periwound Skin Color: Cyanosis: No Ecchymosis:  No Erythema: No Hemosiderin Staining: No Mottled: No Pallor: No Rubor: No No Abnormality N/A No Abnormality Temperature: Debridement Debridement Debridement Procedures Performed: Wound Number: [id number]  Photos: No Photos [name], [name] ([id number]) [id number] Page 4 of 20 Left, Medial Upper Leg Midline Umbilicus Abdomen - midline Wound Location: Pressure Injury Other Lesion Other Lesion Wounding Event: Pressure Ulcer Abrasion Lesion Primary Etiology: Cataracts, Lymphedema, Asthma, Cataracts, Lymphedema, Asthma, Cataracts, Lymphedema, Asthma, Comorbid History: Congestive Heart Failure, CoronaryCongestive Heart Failure, Coronary Congestive Heart Failure, Coronary Artery Disease, Hypertension, Artery Disease, Hypertension, Artery Disease, Hypertension, Peripheral Arterial Disease, Peripheral Peripheral Arterial Disease, Peripheral Peripheral Arterial Disease, Peripheral Venous Disease, Type II Diabetes, Venous Disease, Type II Diabetes, Venous Disease, Type II Diabetes, Rheumatoid Arthritis, Neuropathy Rheumatoid Arthritis, Neuropathy Rheumatoid Arthritis, Neuropathy 12/14/2022 12/14/2022 12/14/2022 Date Acquired: 17 17 17  Weeks of Treatment: Open Open Open Wound Status: No No No Wound Recurrence: Yes Yes Yes Clustered Wound: 5 N/A 4 Clustered Quantity: 3.2x0.5x0.1 4.2x1.5x0.1 0.8x5.5x0.1 Measurements L x [name] x D (cm) 1.257 4.948 3.456 A (cm) : rea 0.126 0.495 0.346 Volume (cm) : 93.60% 89.50% -46.70% % Reduction in A rea: 93.60% 89.50% -46.60% % Reduction in Volume: Category/Stage II Full  Thickness Without Exposed Full Thickness Without Exposed Classification: Support Structures Support Structures Medium Medium Medium Exudate A mount: Serosanguineous Serosanguineous Serosanguineous Exudate Type: red, brown red, brown red, brown Exudate Color: N/A N/A N/A Wound Margin: Large (67-100%) Medium (34-66%) Large (67-100%) Granulation A  mount: Red Pink Red Granulation Quality: Small (1-33%) Medium (34-66%) Small (1-33%) Necrotic A mount: Adherent Slough N/A Eschar, Adherent Slough Necrotic Tissue: Fat Layer (Subcutaneous Tissue): Yes Fat Layer (Subcutaneous Tissue): Yes Fat Layer (Subcutaneous Tissue): Yes Exposed Structures: Fascia: No Fascia: No Fascia: No Tendon: No Tendon: No Tendon: No Muscle: No Muscle: No Muscle: No Joint: No Joint: No Joint: No Bone: No Bone: No Bone: No None Large (67-100%) None Epithelialization: Debridement - Selective/Open Wound Debridement - Selective/Open Wound Debridement - Selective/Open Wound Debridement: Pre-procedure Verification/Time Out 11:14 11:14 11:14 Taken: Lidocaine 4% Topical Solution Lidocaine 4% Topical Solution Lidocaine 4% Topical Solution Pain Control: Ambulance person, Ambulance person, [employer] Tissue Debrided: Non-Viable Tissue Non-Viable Tissue Non-Viable Tissue Level: 1.26 4.95 3.45 Debridement A (sq cm): rea Curette Curette Curette Instrument: Minimum Minimum Minimum Bleeding: Pressure Pressure Pressure Hemostasis A chieved: 0 0 0 Procedural Pain: 0 0 0 Post Procedural Pain: Procedure was tolerated well Procedure was tolerated well Procedure was tolerated well Debridement Treatment Response: 3.2x0.5x0.1 4.2x1.5x0.1 0.8x5.5x0.1 Post Debridement Measurements L x [name] x D (cm) 0.126 0.495 0.346 Post Debridement Volume: (cm) Category/Stage II N/A N/A Post Debridement Stage: Scarring: Yes No Abnormalities Noted No Abnormalities Noted Periwound Skin Texture: No Abnormalities Noted No Abnormalities Noted No Abnormalities Noted Periwound Skin Moisture: No Abnormalities Noted No Abnormalities Noted No Abnormalities Noted Periwound Skin Color: No Abnormality No Abnormality No Abnormality Temperature: Debridement Debridement Debridement Procedures Performed: Wound Number: [id number]  Photos: No Photos Left Abdomen - Lower Quadrant  Right Abdomen - Lower Quadrant Left Breast Wound Location: Gradually Appeared Gradually Appeared Gradually Appeared Wounding Event: Lesion Lesion Lesion Primary Etiology: Cataracts, Lymphedema, Asthma, Cataracts, Lymphedema, Asthma, Cataracts, Lymphedema, Asthma, Comorbid History: Congestive Heart Failure, Coronary Congestive Heart Failure, Coronary Congestive Heart Failure, Coronary Artery Disease, Hypertension, Artery Disease, Hypertension, Artery Disease, Hypertension, Peripheral Arterial Disease, Peripheral Peripheral Arterial Disease, Peripheral Peripheral Arterial Disease, Peripheral Venous Disease, Type II Diabetes, Venous Disease, Type II Diabetes, Venous Disease, Type II Diabetes, Rheumatoid Arthritis, Neuropathy Rheumatoid Arthritis, Neuropathy Rheumatoid Arthritis, Neuropathy 01/25/2023 01/25/2023 01/25/2023 Date Acquired: 13 13 13  Weeks of Treatment: Open Healed - Epithelialized Open Wound Status: No No No Wound Recurrence: Yes Yes Yes Clustered Wound: 5 3 N/A Clustered Quantity: [name], [name] ([id number]) [id number] Page 5 of 20 7.3x2.6x0.1 0x0x0 7x3x0.1 Measurements L x [name] x D (cm) 14.907 0 16.493 A (cm) : rea 1.491 0 1.649 Volume (cm) : 32.20% 100.00% -183.80% % Reduction in Area: 32.20% 100.00% -41.90% % Reduction in Volume: Full Thickness Without Exposed Full Thickness Without Exposed Full Thickness Without Exposed Classification: Support Structures Support Structures Support Structures Medium Medium Medium Exudate A mount: Serosanguineous Serosanguineous Serosanguineous Exudate Type: red, brown red, brown red, brown Exudate Color: N/A N/A N/A Wound Margin: Small (1-33%) Large (67-100%) Medium (34-66%) Granulation A mount: Red Red Red Granulation Quality: Large (67-100%) Small (1-33%) Medium (34-66%) Necrotic A mount: Adherent Slough Adherent [employer], Adherent Slough Necrotic Tissue: Fat Layer (Subcutaneous  Tissue): Yes Fat Layer (Subcutaneous Tissue): Yes Fat Layer (Subcutaneous Tissue): Yes Exposed Structures: Fascia: No Fascia: No Fascia: No Tendon: No Tendon: No Tendon: No Muscle: No Muscle: No Muscle: No Joint: No Joint: No Joint: No Bone: No Bone: No Bone: No None None None Epithelialization: Debridement -  Selective/Open Wound N/A Debridement - Selective/Open Wound Debridement: Pre-procedure Verification/Time Out 11:14 N/A 11:14 Taken: Lidocaine 4% Topical Solution N/A Lidocaine 4% Topical Solution Pain Control: Slough N/A Slough Tissue Debrided: Non-Viable Tissue N/A Non-Viable Tissue Level: 14.9 N/A 16.48 Debridement A (sq cm): rea Curette N/A Curette Instrument: Minimum N/A Minimum Bleeding: Pressure N/A Pressure Hemostasis A chieved: 0 N/A 0 Procedural Pain: 0 N/A 0 Post Procedural Pain: Procedure was tolerated well N/A Procedure was tolerated well Debridement Treatment Response: 7.3x2.6x0.1 N/A 7x3x0.1 Post Debridement Measurements L x [name] x D (cm) 1.491 N/A 1.649 Post Debridement Volume: (cm) N/A N/A N/A Post Debridement Stage: No Abnormalities Noted No Abnormalities Noted No Abnormalities Noted Periwound Skin Texture: Maceration: Yes No Abnormalities Noted No Abnormalities Noted Periwound Skin Moisture: No Abnormalities Noted No Abnormalities Noted No Abnormalities Noted Periwound Skin Color: No Abnormality No Abnormality No Abnormality Temperature: Debridement N/A Debridement Procedures Performed: Treatment Notes Electronic Signature(s) Signed: 04/26/2023 11:30:33 AM By: Duanne Guess MD FACS Entered By: Duanne Guess on 04/26/2023 11:30:33 -------------------------------------------------------------------------------- Multi-Disciplinary Care Plan Details Patient Name: Date of Service: Bendall, [name] 04/26/2023 10:15 A [name] Medical Record Number: [id number] Patient Account Number: [id number] Date of Birth/Sex: Treating RN: November 28, 1960  (62 y.o. Gevena Mart Primary Care Westyn Driggers: [name] Other Clinician: Referring Arsenia Goracke: Treating Varick Keys/Extender: Vivien Rossetti in Treatment: 40 Multidisciplinary Care Plan reviewed with physician Active Inactive Venous Leg Ulcer Nursing Diagnoses: Potential for venous Insuffiency (use before diagnosis confirmed) Goals: Rowton, Olia [name] ([id number]) [id number] Page 6 of 20 Patient will maintain optimal edema control Date Initiated: 07/19/2022 Target Resolution Date: 08/12/2026 Goal Status: Active Interventions: Assess peripheral edema status every visit. Treatment Activities: Therapeutic compression applied : 07/19/2022 Notes: Wound/Skin Impairment Nursing Diagnoses: Impaired tissue integrity Knowledge deficit related to ulceration/compromised skin integrity Goals: Patient/caregiver will verbalize understanding of skin care regimen Date Initiated: 11/14/2022 Target Resolution Date: 08/12/2026 Goal Status: Active Interventions: Assess patient/caregiver ability to obtain necessary supplies Assess patient/caregiver ability to perform ulcer/skin care regimen upon admission and as needed Assess ulceration(s) every visit Treatment Activities: Skin care regimen initiated : 11/14/2022 Topical wound management initiated : 11/14/2022 Notes: Electronic Signature(s) Signed: 05/08/2023 2:03:55 PM By: Brenton Grills Entered By: Brenton Grills on 04/26/2023 11:10:39 -------------------------------------------------------------------------------- Pain Assessment Details Patient Name: Date of Service: Elahi, [name] 04/26/2023 10:15 A [name] Medical Record Number: [id number] Patient Account Number: [id number] Date of Birth/Sex: Treating RN: [birth year] (62 y.o. Gevena Mart Primary Care Amillia Biffle: [name] Other Clinician: Referring Roscoe Witts: Treating Niley Helbig/Extender: Vivien Rossetti  in Treatment: 40 Active Problems Location of Pain Severity and Description of Pain Patient Has Paino No Site Locations Raymond, Altamont [name] ([id number]) [id number] Page 7 of 20 Pain Management and Medication Current Pain Management: Electronic Signature(s) Signed: 05/08/2023 2:03:55 PM By: Brenton Grills Entered By: Brenton Grills on 04/26/2023 10:34:21 -------------------------------------------------------------------------------- Patient/Caregiver Education Details Patient Name: Date of Service: Lanum, [name] 8/15/2024andnbsp10:15 A [name] Medical Record Number: [id number] Patient Account Number: [id number] Date of Birth/Gender: Treating RN: Mar 06, 1961 (62 y.o. Gevena Mart Primary Care Physician: [name] Other Clinician: Referring Physician: Treating Physician/Extender: Vivien Rossetti in Treatment: 40 Education Assessment Education Provided To: Patient and Caregiver Education Topics Provided Wound/Skin Impairment: Methods: Explain/Verbal Responses: State content correctly Electronic Signature(s) Signed: 05/08/2023 2:03:55 PM By: Brenton Grills Entered By: Brenton Grills on 04/26/2023 11:11:05 -------------------------------------------------------------------------------- Wound Assessment Details Patient Name: Date of Service: Stinnette, [name] 04/26/2023 10:15 A [name] Medical Record Number: [id number] Patient Account Number: [id number]  Date of Birth/Sex: Treating RN: Oct 13, 1960 (62 y.o. Gevena Mart Primary Care Colburn Asper: [name] Other Clinician: Referring Dominigue Gellner: Treating Jackolyn Geron/Extender: Ocie Doyne Wisby, Chaunta [name] ([id number]) [id number] Page 8 of 20 Weeks in Treatment: 40 Wound Status Wound Number: 19 Primary Diabetic Wound/Ulcer of the Lower Extremity Etiology: Wound Location: Left, Plantar Foot Wound Open Wounding Event: Gradually  Appeared Status: Date Acquired: 05/17/2022 Comorbid Cataracts, Lymphedema, Asthma, Congestive Heart Failure, Weeks Of Treatment: 40 History: Coronary Artery Disease, Hypertension, Peripheral Arterial Disease, Clustered Wound: Yes Peripheral Venous Disease, Type II Diabetes, Rheumatoid Arthritis, Neuropathy Photos Wound Measurements Length: (cm) Width: (cm) Depth: (cm) Clustered Quantity: Area: (cm) Volume: (cm) 12 % Reduction in Area: -59.4% 3.8 % Reduction in Volume: 20.3% 0.2 Epithelialization: None 2 Tunneling: No 35.814 Undermining: No 7.163 Wound Description Classification: Grade 2 Wound Margin: Distinct, outline attached Exudate Amount: Medium Exudate Type: Serosanguineous Exudate Color: red, brown Foul Odor After Cleansing: No Slough/Fibrino Yes Wound Bed Granulation Amount: Large (67-100%) Exposed Structure Granulation Quality: Pink Fascia Exposed: No Necrotic Amount: Small (1-33%) Fat Layer (Subcutaneous Tissue) Exposed: Yes Necrotic Quality: Adherent Slough Tendon Exposed: No Muscle Exposed: No Joint Exposed: No Bone Exposed: No Periwound Skin Texture Texture Color No Abnormalities Noted: No No Abnormalities Noted: No Callus: Yes Atrophie Blanche: No Crepitus: No Cyanosis: No Excoriation: No Ecchymosis: No Induration: No Erythema: No Rash: No Hemosiderin Staining: No Scarring: Yes Mottled: No Pallor: No Moisture Rubor: No No Abnormalities Noted: Yes Temperature / Pain Temperature: No Abnormality Treatment Notes Wound #19 (Foot) Wound Laterality: Plantar, Left Cleanser Soap and Water Discharge Instruction: May shower and wash wound with dial antibacterial soap and water prior to dressing change. Wound Cleanser Discharge Instruction: Cleanse the wound with wound cleanser prior to applying a clean dressing using gauze sponges, not tissue or cotton balls. Adelsberger, [name] ([id number]) [id number] Page 9 of  20 Peri-Wound Care Topical Primary Dressing Maxorb Extra Ag+ Alginate Dressing, 4x4.75 (in/in) Discharge Instruction: Apply to wound bed as instructed Secondary Dressing ABD Pad, 5x9 Discharge Instruction: Apply over primary dressing as directed. Woven Gauze Sponge, Non-Sterile 4x4 in Discharge Instruction: Apply over primary dressing as directed. Secured With Elastic Bandage 4 inch (ACE bandage) Discharge Instruction: Secure with ACE bandage as directed. Kerlix Roll Sterile, 4.5x3.1 (in/yd) Discharge Instruction: Secure with Kerlix as directed. 78M Medipore Soft Cloth Surgical T 2x10 (in/yd) ape Discharge Instruction: Secure with tape as directed. Compression Wrap Compression Stockings Add-Ons Electronic Signature(s) Signed: 05/08/2023 2:03:55 PM By: Brenton Grills Entered By: Brenton Grills on 04/26/2023 11:00:18 -------------------------------------------------------------------------------- Wound Assessment Details Patient Name: Date of Service: Calarco, [name] 04/26/2023 10:15 A [name] Medical Record Number: [id number] Patient Account Number: [id number] Date of Birth/Sex: Treating RN: 1961-07-01 (62 y.o. Gevena Mart Primary Care Mareo Portilla: [name] Other Clinician: Referring Kenyata Napier: Treating Shaunda Tipping/Extender: Vivien Rossetti in Treatment: 40 Wound Status Wound Number: 20 Primary Pressure Ulcer Etiology: Wound Location: Right Amputation Site - Above Knee Wound Open Wounding Event: Pressure Injury Status: Date Acquired: 12/14/2022 Comorbid Cataracts, Lymphedema, Asthma, Congestive Heart Failure, Weeks Of Treatment: 17 History: Coronary Artery Disease, Hypertension, Peripheral Arterial Disease, Clustered Wound: Yes Peripheral Venous Disease, Type II Diabetes, Rheumatoid Arthritis, Neuropathy Photos Wound Measurements Length: (cm) 1.9 Tooley, Onie [name] ([id number]) Width: (cm) 1.9 Depth: (cm) 0.1 Clustered Quantity: 4 Area:  (cm) 2.835 Volume: (cm) 0.284 % Reduction in Area: 85% [id number] Page 10 of 20 % Reduction in Volume: 84.9% Epithelialization: Medium (34-66%) Tunneling: No Undermining: No Wound Description Classification: Category/Stage II Exudate  Amount: Medium Exudate Type: Serosanguineous Exudate Color: red, brown Foul Odor After Cleansing: No Slough/Fibrino No Wound Bed Granulation Amount: Large (67-100%) Exposed Structure Granulation Quality: Red Fascia Exposed: No Necrotic Amount: Small (1-33%) Fat Layer (Subcutaneous Tissue) Exposed: Yes Necrotic Quality: Adherent Slough Tendon Exposed: No Muscle Exposed: No Joint Exposed: No Bone Exposed: No Periwound Skin Texture Texture Color No Abnormalities Noted: No No Abnormalities Noted: Yes Scarring: Yes Moisture No Abnormalities Noted: Yes Treatment Notes Wound #20 (Amputation Site - Above Knee) Wound Laterality: Right Cleanser Peri-Wound Care Topical Primary Dressing Maxorb Extra Ag+ Alginate Dressing, 4x4.75 (in/in) Discharge Instruction: Apply to wound bed as instructed Secondary Dressing ABD Pad, 8x10 Discharge Instruction: Apply over primary dressing as directed. Secured With 28M Medipore H Soft Cloth Surgical T ape, 4 x 10 (in/yd) Discharge Instruction: Secure with tape as directed. Compression Wrap Compression Stockings Add-Ons Electronic Signature(s) Signed: 05/08/2023 2:03:55 PM By: Brenton Grills Entered By: Brenton Grills on 04/26/2023 11:01:17 -------------------------------------------------------------------------------- Wound Assessment Details Patient Name: Date of Service: [name], [name] 04/26/2023 10:15 A [name] Medical Record Number: [id number] Patient Account Number: [id number] Date of Birth/Sex: Treating RN: 1961-05-19 (62 y.o. Gevena Mart Primary Care Verlie Liotta: [name] Other Clinician: Referring Alcario Tinkey: Treating Lilee Aldea/Extender: Vivien Rossetti in Treatment: 40 [name], [name] ([id number]) [id number] Page 11 of 20 Wound Status Wound Number: 21 Primary Pressure Ulcer Etiology: Wound Location: Medial Pubis Wound Open Wounding Event: Pressure Injury Status: Date Acquired: 12/14/2022 Comorbid Cataracts, Lymphedema, Asthma, Congestive Heart Failure, Weeks Of Treatment: 17 History: Coronary Artery Disease, Hypertension, Peripheral Arterial Disease, Clustered Wound: Yes Peripheral Venous Disease, Type II Diabetes, Rheumatoid Arthritis, Neuropathy Photos Wound Measurements Length: (cm) 9.3 Width: (cm) 10.2 Depth: (cm) 0.1 Clustered Quantity: 5 Area: (cm) 74.503 Volume: (cm) 7.45 % Reduction in Area: -82.4% % Reduction in Volume: -82.4% Epithelialization: None Tunneling: No Undermining: No Wound Description Classification: Category/Stage II Exudate Amount: Medium Exudate Type: Serosanguineous Exudate Color: red, brown Foul Odor After Cleansing: No Slough/Fibrino Yes Wound Bed Granulation Amount: Large (67-100%) Exposed Structure Granulation Quality: Red Fascia Exposed: No Necrotic Amount: Small (1-33%) Fat Layer (Subcutaneous Tissue) Exposed: Yes Necrotic Quality: Adherent Slough Tendon Exposed: No Muscle Exposed: No Joint Exposed: No Bone Exposed: No Periwound Skin Texture Texture Color No Abnormalities Noted: No No Abnormalities Noted: Yes Scarring: Yes Temperature / Pain Temperature: No Abnormality Moisture No Abnormalities Noted: Yes Treatment Notes Wound #21 (Pubis) Wound Laterality: Medial Cleanser Peri-Wound Care Zinc Oxide Ointment 30g tube Discharge Instruction: Apply Zinc Oxide to periwound with each dressing change Topical Primary Dressing Secondary Dressing ABD Pad, 8x10 Discharge Instruction: Apply over primary dressing as directed. Secured With Vandall, Aarushi [name] ([id number]) [id number] Page 12 of 20 28M Medipore H Soft  Cloth Surgical T ape, 4 x 10 (in/yd) Discharge Instruction: Secure with tape as directed. Compression Wrap Compression Stockings Add-Ons Electronic Signature(s) Signed: 05/08/2023 2:03:55 PM By: Brenton Grills Entered By: Brenton Grills on 04/26/2023 11:01:56 -------------------------------------------------------------------------------- Wound Assessment Details Patient Name: Date of Service: [name], [name] 04/26/2023 10:15 A [name] Medical Record Number: [id number] Patient Account Number: [id number] Date of Birth/Sex: Treating RN: [birth year] (62 y.o. Gevena Mart Primary Care Kelon Easom: [name] Other Clinician: Referring Corayma Cashatt: Treating Ariyel Jeangilles/Extender: Vivien Rossetti in Treatment: 40 Wound Status Wound Number: 22 Primary Pressure Ulcer Etiology: Wound Location: Left, Medial Upper Leg Wound Open Wounding Event: Pressure Injury Status: Date Acquired: 12/14/2022 Comorbid Cataracts, Lymphedema, Asthma, Congestive Heart Failure, Weeks Of Treatment: 17 History: Coronary Artery Disease, Hypertension, Peripheral  Arterial Disease, Clustered Wound: Yes Peripheral Venous Disease, Type II Diabetes, Rheumatoid Arthritis, Neuropathy Photos Wound Measurements Length: (cm) Width: (cm) Depth: (cm) Clustered Quantity: Area: (cm) Volume: (cm) 3.2 % Reduction in Area: 93.6% 0.5 % Reduction in Volume: 93.6% 0.1 Epithelialization: None 5 Tunneling: No 1.257 Undermining: No 0.126 Wound Description Classification: Category/Stage II Exudate Amount: Medium Exudate Type: Serosanguineous Exudate Color: red, brown Foul Odor After Cleansing: No Slough/Fibrino Yes Wound Bed Granulation Amount: Large (67-100%) Exposed Structure Granulation Quality: Red Fascia Exposed: No Necrotic Amount: Small (1-33%) Fat Layer (Subcutaneous Tissue) Exposed: Yes Necrotic Quality: Adherent Slough Tendon Exposed: No Muscle Exposed: No Joint Exposed: No [name],  [name] ([id number]) [id number] Page 13 of 20 Bone Exposed: No Periwound Skin Texture Texture Color No Abnormalities Noted: No No Abnormalities Noted: Yes Scarring: Yes Temperature / Pain Temperature: No Abnormality Moisture No Abnormalities Noted: Yes Treatment Notes Wound #22 (Upper Leg) Wound Laterality: Left, Medial Cleanser Peri-Wound Care Topical Primary Dressing Secondary Dressing Secured With Compression Wrap Compression Stockings Add-Ons Electronic Signature(s) Signed: 05/08/2023 2:03:55 PM By: Brenton Grills Entered By: Brenton Grills on 04/26/2023 11:02:55 -------------------------------------------------------------------------------- Wound Assessment Details Patient Name: Date of Service: [name], [name] 04/26/2023 10:15 A [name] Medical Record Number: [id number] Patient Account Number: [id number] Date of Birth/Sex: Treating RN: [birth year] (62 y.o. Gevena Mart Primary Care Carlito Bogert: [name] Other Clinician: Referring Mattisyn Cardona: Treating Alyzah Pelly/Extender: Vivien Rossetti in Treatment: 40 Wound Status Wound Number: 23 Primary Abrasion Etiology: Wound Location: Midline Umbilicus Wound Open Wounding Event: Other Lesion Status: Date Acquired: 12/14/2022 Comorbid Cataracts, Lymphedema, Asthma, Congestive Heart Failure, Weeks Of Treatment: 17 History: Coronary Artery Disease, Hypertension, Peripheral Arterial Disease, Clustered Wound: Yes Peripheral Venous Disease, Type II Diabetes, Rheumatoid Arthritis, Neuropathy Photos Wound Measurements Newkirk, Gem [name] ([id number]) Length: (cm) 4.2 Width: (cm) 1.5 Depth: (cm) 0.1 Area: (cm) 4.948 Volume: (cm) 0.495 [id number] Page 14 of 20 % Reduction in Area: 89.5% % Reduction in Volume: 89.5% Epithelialization: Large (67-100%) Tunneling: No Undermining: No Wound Description Classification: Full Thickness Without Exposed  Suppor Exudate Amount: Medium Exudate Type: Serosanguineous Exudate Color: red, brown t Structures Foul Odor After Cleansing: No Slough/Fibrino Yes Wound Bed Granulation Amount: Medium (34-66%) Exposed Structure Granulation Quality: Pink Fascia Exposed: No Necrotic Amount: Medium (34-66%) Fat Layer (Subcutaneous Tissue) Exposed: Yes Tendon Exposed: No Muscle Exposed: No Joint Exposed: No Bone Exposed: No Periwound Skin Texture Texture Color No Abnormalities Noted: Yes No Abnormalities Noted: Yes Moisture Temperature / Pain No Abnormalities Noted: Yes Temperature: No Abnormality Treatment Notes Wound #23 (Umbilicus) Wound Laterality: Midline Cleanser Peri-Wound Care Topical Primary Dressing Maxorb Extra Ag+ Alginate Dressing, 4x4.75 (in/in) Discharge Instruction: Apply to wound bed as instructed Secondary Dressing ABD Pad, 8x10 Discharge Instruction: Apply over primary dressing as directed. Secured With 59M Medipore H Soft Cloth Surgical T ape, 4 x 10 (in/yd) Discharge Instruction: Secure with tape as directed. Compression Wrap Compression Stockings Add-Ons Electronic Signature(s) Signed: 05/08/2023 2:03:55 PM By: Brenton Grills Entered By: Brenton Grills on 04/26/2023 11:03:32 -------------------------------------------------------------------------------- Wound Assessment Details Patient Name: Date of Service: [name], [name] 04/26/2023 10:15 A [name] Medical Record Number: [id number] Patient Account Number: [id number] Date of Birth/Sex: Treating RN: 1961-03-26 (62 y.o. Gevena Mart Primary Care Montford Barg: [name] Other Clinician: Referring Jeet Shough: Treating Kiaja Shorty/Extender: Vivien Rossetti in Treatment: 40 Reim, Drisana [name] ([id number]) [id number] Page 15 of 20 Wound Status Wound Number: 24 Primary Lesion Etiology: Wound Location: Abdomen - midline Wound Open Wounding Event: Other  Lesion Status: Date  Acquired: 12/14/2022 Notes: PANNUS Weeks Of Treatment: 17 Comorbid Cataracts, Lymphedema, Asthma, Congestive Heart Failure, Clustered Wound: Yes History: Coronary Artery Disease, Hypertension, Peripheral Arterial Disease, Peripheral Venous Disease, Type II Diabetes, Rheumatoid Arthritis, Neuropathy Wound Measurements Length: (cm) Width: (cm) Depth: (cm) Clustered Quantity: Area: (cm) Volume: (cm) 0.8 % Reduction in Area: -46.7% 5.5 % Reduction in Volume: -46.6% 0.1 Epithelialization: None 4 Tunneling: No 3.456 Undermining: No 0.346 Wound Description Classification: Full Thickness Without Exposed Sup Exudate Amount: Medium Exudate Type: Serosanguineous Exudate Color: red, brown port Structures Foul Odor After Cleansing: No Slough/Fibrino Yes Wound Bed Granulation Amount: Large (67-100%) Exposed Structure Granulation Quality: Red Fascia Exposed: No Necrotic Amount: Small (1-33%) Fat Layer (Subcutaneous Tissue) Exposed: Yes Necrotic Quality: Eschar, Adherent Slough Tendon Exposed: No Muscle Exposed: No Joint Exposed: No Bone Exposed: No Periwound Skin Texture Texture Color No Abnormalities Noted: Yes No Abnormalities Noted: Yes Moisture Temperature / Pain No Abnormalities Noted: Yes Temperature: No Abnormality Treatment Notes Wound #24 (Abdomen - midline) Cleanser Peri-Wound Care Topical Santyl Collagenase Ointment, 30 (gm), tube Discharge Instruction: Apply to crusty area Primary Dressing Maxorb Extra Ag+ Alginate Dressing, 4x4.75 (in/in) Discharge Instruction: Apply to wound bed as instructed Secondary Dressing ABD Pad, 8x10 Discharge Instruction: Apply over primary dressing as directed. Secured With 54M Medipore H Soft Cloth Surgical T ape, 4 x 10 (in/yd) Discharge Instruction: Secure with tape as directed. Compression Wrap Compression Stockings Add-Ons Electronic Signature(s) Signed: 05/08/2023 2:03:55 PM By: Brenton Grills Entered  By: Brenton Grills on 04/26/2023 10:57:28 Lennon, [name] ([id number]) [id number] Page 16 of 20 -------------------------------------------------------------------------------- Wound Assessment Details Patient Name: Date of Service: Lourenco, [name] 04/26/2023 10:15 A [name] Medical Record Number: [id number] Patient Account Number: [id number] Date of Birth/Sex: Treating RN: 1961/06/03 (62 y.o. Gevena Mart Primary Care Zebastian Carico: [name] Other Clinician: Referring Porshea Janowski: Treating Linard Daft/Extender: Vivien Rossetti in Treatment: 40 Wound Status Wound Number: 25 Primary Lesion Etiology: Wound Location: Left Abdomen - Lower Quadrant Wound Open Wounding Event: Gradually Appeared Status: Date Acquired: 01/25/2023 Comorbid Cataracts, Lymphedema, Asthma, Congestive Heart Failure, Weeks Of Treatment: 13 History: Coronary Artery Disease, Hypertension, Peripheral Arterial Disease, Clustered Wound: Yes Peripheral Venous Disease, Type II Diabetes, Rheumatoid Arthritis, Neuropathy Photos Wound Measurements Length: (cm) Width: (cm) Depth: (cm) Clustered Quantity: Area: (cm) Volume: (cm) 7.3 % Reduction in Area: 32.2% 2.6 % Reduction in Volume: 32.2% 0.1 Epithelialization: None 5 Tunneling: No 14.907 Undermining: No 1.491 Wound Description Classification: Full Thickness Without Exposed Sup Exudate Amount: Medium Exudate Type: Serosanguineous Exudate Color: red, brown port Structures Foul Odor After Cleansing: No Slough/Fibrino Yes Wound Bed Granulation Amount: Small (1-33%) Exposed Structure Granulation Quality: Red Fascia Exposed: No Necrotic Amount: Large (67-100%) Fat Layer (Subcutaneous Tissue) Exposed: Yes Necrotic Quality: Adherent Slough Tendon Exposed: No Muscle Exposed: No Joint Exposed: No Bone Exposed: No Periwound Skin Texture Texture Color No Abnormalities Noted: Yes No Abnormalities Noted:  Yes Moisture Temperature / Pain No Abnormalities Noted: Yes Temperature: No Abnormality Treatment Notes Wound #25 (Abdomen - Lower Quadrant) Wound Laterality: Left Seel, Solaris [name] ([id number]) [id number] Page 17 of 20 Cleanser Peri-Wound Care Topical Primary Dressing Secondary Dressing Secured With Compression Wrap Compression Stockings Add-Ons Electronic Signature(s) Signed: 05/08/2023 2:03:55 PM By: Brenton Grills Entered By: Brenton Grills on 04/26/2023 11:04:08 -------------------------------------------------------------------------------- Wound Assessment Details Patient Name: Date of Service: Guerrero, [name] 04/26/2023 10:15 A [name] Medical Record Number: [id number] Patient Account Number: [id number] Date of Birth/Sex: Treating RN: 1960/12/08 (62 y.o. Gevena Mart Primary Care Infiniti Hoefling: [name]  Other Clinician: Referring Perrin Gens: Treating Neenah Canter/Extender: Vivien Rossetti in Treatment: 40 Wound Status Wound Number: 26 Primary Lesion Etiology: Wound Location: Right Abdomen - Lower Quadrant Wound Healed - Epithelialized Wounding Event: Gradually Appeared Status: Date Acquired: 01/25/2023 Comorbid Cataracts, Lymphedema, Asthma, Congestive Heart Failure, Weeks Of Treatment: 13 History: Coronary Artery Disease, Hypertension, Peripheral Arterial Disease, Clustered Wound: Yes Peripheral Venous Disease, Type II Diabetes, Rheumatoid Arthritis, Neuropathy Wound Measurements Length: (cm) Width: (cm) Depth: (cm) Clustered Quantity: Area: (cm) Volume: (cm) 0 % Reduction in Area: 100% 0 % Reduction in Volume: 100% 0 Epithelialization: None 3 Tunneling: No 0 Undermining: No 0 Wound Description Classification: Full Thickness Without Exposed Sup Exudate Amount: Medium Exudate Type: Serosanguineous Exudate Color: red, brown port Structures Foul Odor After Cleansing: No Slough/Fibrino Yes Wound  Bed Granulation Amount: Large (67-100%) Exposed Structure Granulation Quality: Red Fascia Exposed: No Necrotic Amount: Small (1-33%) Fat Layer (Subcutaneous Tissue) Exposed: Yes Necrotic Quality: Adherent Slough Tendon Exposed: No Muscle Exposed: No Joint Exposed: No Bone Exposed: No Periwound Skin Texture Texture Color No Abnormalities Noted: Yes No Abnormalities Noted: Yes Moisture Temperature / Pain No Abnormalities Noted: Yes Temperature: No Abnormality Krahl, Shalina [name] ([id number]) [id number] Page 18 of 20 Treatment Notes Wound #26 (Abdomen - Lower Quadrant) Wound Laterality: Right Cleanser Peri-Wound Care Topical Primary Dressing Secondary Dressing Secured With Compression Wrap Compression Stockings Add-Ons Electronic Signature(s) Signed: 05/08/2023 2:03:55 PM By: Brenton Grills Entered By: Brenton Grills on 04/26/2023 10:57:53 -------------------------------------------------------------------------------- Wound Assessment Details Patient Name: Date of Service: Littles, [name] 04/26/2023 10:15 A [name] Medical Record Number: [id number] Patient Account Number: [id number] Date of Birth/Sex: Treating RN: 09-13-1960 (62 y.o. Gevena Mart Primary Care Carrina Schoenberger: [name] Other Clinician: Referring Luara Faye: Treating Darryll Raju/Extender: Vivien Rossetti in Treatment: 40 Wound Status Wound Number: 27 Primary Lesion Etiology: Wound Location: Left Breast Wound Open Wounding Event: Gradually Appeared Status: Date Acquired: 01/25/2023 Comorbid Cataracts, Lymphedema, Asthma, Congestive Heart Failure, Weeks Of Treatment: 13 History: Coronary Artery Disease, Hypertension, Peripheral Arterial Disease, Clustered Wound: Yes Peripheral Venous Disease, Type II Diabetes, Rheumatoid Arthritis, Neuropathy Photos Wound Measurements Length: (cm) 7 Width: (cm) 3 Depth: (cm) 0.1 Area: (cm) 16.493 Volume: (cm)  1.649 % Reduction in Area: -183.8% % Reduction in Volume: -41.9% Epithelialization: None Tunneling: No Undermining: No Wound Description Classification: Full Thickness Without Exposed Support Stolp, Maesyn [name] ([id number]) Exudate Amount: Medium Exudate Type: Serosanguineous Exudate Color: red, brown Structures Foul Odor After Cleansing: No [id number] Page 19 of 20 Slough/Fibrino Yes Wound Bed Granulation Amount: Medium (34-66%) Exposed Structure Granulation Quality: Red Fascia Exposed: No Necrotic Amount: Medium (34-66%) Fat Layer (Subcutaneous Tissue) Exposed: Yes Necrotic Quality: Eschar, Adherent Slough Tendon Exposed: No Muscle Exposed: No Joint Exposed: No Bone Exposed: No Periwound Skin Texture Texture Color No Abnormalities Noted: Yes No Abnormalities Noted: Yes Moisture Temperature / Pain No Abnormalities Noted: Yes Temperature: No Abnormality Treatment Notes Wound #27 (Breast) Wound Laterality: Left Cleanser Peri-Wound Care Topical Santyl Collagenase Ointment, 30 (gm), tube Discharge Instruction: Apply to the crusty wound on left breast Primary Dressing Maxorb Extra Ag+ Alginate Dressing, 4x4.75 (in/in) Discharge Instruction: Apply to wound bed as instructed Secondary Dressing ABD Pad, 8x10 Discharge Instruction: Apply over primary dressing as directed. Secured With 109M Medipore H Soft Cloth Surgical T ape, 4 x 10 (in/yd) Discharge Instruction: Secure with tape as directed. Compression Wrap Compression Stockings Add-Ons Electronic Signature(s) Signed: 05/08/2023 2:03:55 PM By: Brenton Grills Entered By: Brenton Grills on 04/26/2023 11:04:42 -------------------------------------------------------------------------------- Vitals Details Patient Name: Date  of Service: Javier, [name] 04/26/2023 10:15 A [name] Medical Record Number: [id number] Patient Account Number: [id number] Date of Birth/Sex: Treating RN: 1961/06/14 (62 y.o. Gevena Mart Primary Care Irwin Toran: [name] Other Clinician: Referring Byrd Terrero: Treating Nickolis Diel/Extender: Vivien Rossetti in Treatment: 40 Vital Signs Time Taken: 10:30 Temperature (F): 98.3 Height (in): 62 Pulse (bpm): 94 Weight (lbs): 284 Respiratory Rate (breaths/min): 18 Wich, Leyah [name] ([id number]) [id number] Page 20 of 20 Body Mass Index (BMI): 51.9 Blood Pressure (mmHg): 122/67 Reference Range: 80 - 120 mg / dl Electronic Signature(s) Signed: 05/08/2023 2:03:55 PM By: Brenton Grills Entered By: Brenton Grills on 04/26/2023 10:34:08

## 2023-05-08 NOTE — Progress Notes (Signed)
Subjective:   Chief Complaint  Patient presents with   Follow-up    Lab work    [name] is a 62 y.o. female here for follow-up of diabetes.  She is here with her spouse. [name]'s sugars are high/fluctuating.  Patient confirms hypoglycemic reactions. She has a CGM.  Patient does require insulin.   Medications include: Metformin XR 1000 mg bid Diet is OK.  Exercise: limited No chest pain or shortness of breath.  Past Medical History:  Diagnosis Date   Aortic stenosis    mild AS by echo 02/2021   Asthma    "on daily RX and rescue inhaler" (04/18/2018)   Chronic diastolic CHF (congestive heart failure) (HCC) 09/2015   Chronic lower back pain    Chronic neck pain    Chronic pain syndrome    Fentanyl Patch   Colon polyps    Coronary artery disease    cath with normal LM, 30% LAD, 85% mid RCA and 95% distal RCA s/p PCI of the mid to distal RCA and now on DAPT with ASA and Ticagrelor.     Eczema    Excessive daytime sleepiness 11/26/2015   Fibromyalgia    Gallstones    GERD (gastroesophageal reflux disease)    Heart murmur    "noted for the 1st time on 04/18/2018"   History of blood transfusion 07/2010   "S/P oophorectomy"   History of gout    History of hiatal hernia 1980s   "gone now" (04/18/2018)   Hyperlipidemia    Hypertension    takes Metoprolol and Enalapril daily   Hypothyroidism    takes Synthroid daily   IBS (irritable bowel syndrome)    Migraine    "nothing in the 2000s" (04/18/2018)   Mixed connective tissue disease (HCC)    NAFLD (nonalcoholic fatty liver disease)    Pneumonia    "several times" (04/18/2018)   PVC's (premature ventricular contractions)    noted on event monitor 02/2021   Rheumatoid arthritis (HCC)    "hands, elbows, shoulders, probably knees" (04/18/2018)   Scoliosis    Sjogren's syndrome (HCC)    Sleep apnea    mild - does not use cpap   Spondylosis    Type II diabetes mellitus (HCC)    takes Metformin and Hum R daily (04/18/2018)    Walker as ambulation aid    also uses wheelchair     Related testing: Retinal exam: Done Pneumovax: done  Objective:  BP 128/70 (BP Location: Left Arm, Patient Position: Sitting, Cuff Size: Large)   Pulse 96   Temp 98.7 F (37.1 C) (Oral)   Wt 292 lb (132.5 kg) Comment: patient reported  SpO2 97%   BMI 53.41 kg/m  General:  Well developed, well nourished, in no apparent distress Lungs:  CTAB, no access msc use Cardio:  RRR, no bruits, no LE edema on the left, 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  She will send me some readings with her CGM in the next couple weeks.  We could consider a weekly injectable but she thinks that cost will be a significant factor.  Continue metformin XR 1000 mg twice daily.  I would like her to be consistent with her insulin as long as she is  not having hypoglycemic episodes.  Could consider endocrinology referral again.  Counseled on diet and exercise. F/u pending above The patient voiced understanding and agreement to the plan.  Jilda Roche [place], DO 05/08/23 12:01 PM

## 2023-05-08 NOTE — Progress Notes (Signed)
Ehrhard, Megham [name] ([id number]) [id number] Page 1 of 22 Visit Report for 04/26/2023 Chief Complaint Document Details Patient Name: Date of Service: [name], [name] 04/26/2023 10:15 A [name] Medical Record Number: [id number] Patient Account Number: [id number] Date of Birth/Sex: Treating RN: December 07, 1960 (62 y.o. F) Primary Care Provider: [name] Other Clinician: Referring Provider: Treating Provider/Extender: Vivien Rossetti in Treatment: 40 Information Obtained from: Patient Chief Complaint 10/17/2021; multiple scattered wounds to the abdomen, posterior left leg, left labia and sacrum 07/19/2022: left foot DFU Electronic Signature(s) Signed: 04/26/2023 11:30:46 AM By: Duanne Guess MD FACS Entered By: Duanne Guess on 04/26/2023 11:30:46 -------------------------------------------------------------------------------- Debridement Details Patient Name: Date of Service: Cerullo, [name] 04/26/2023 10:15 A [name] Medical Record Number: [id number] Patient Account Number: [id number] Date of Birth/Sex: Treating RN: [birth year] (62 y.o. Gevena Mart Primary Care Provider: [name] Other Clinician: Referring Provider: Treating Provider/Extender: Vivien Rossetti in Treatment: 40 Debridement Performed for Assessment: Wound #27 Left Breast Performed By: Physician Duanne Guess, MD Debridement Type: Debridement Level of Consciousness (Pre-procedure): Awake and Alert Pre-procedure Verification/Time Out Yes - 11:14 Taken: Start Time: 11:15 Pain Control: Lidocaine 4% T opical Solution Percent of Wound Bed Debrided: 100% T Area Debrided (cm): otal 16.48 Tissue and other material debrided: Viable, Non-Viable, Slough, Slough Level: Non-Viable Tissue Debridement Description: Selective/Open Wound Instrument: Curette Bleeding: Minimum Hemostasis Achieved: Pressure End Time: 11:16 Procedural Pain:  0 Post Procedural Pain: 0 Response to Treatment: Procedure was tolerated well Level of Consciousness (Post- Awake and Alert procedure): Post Debridement Measurements of Total Wound Length: (cm) 7 Width: (cm) 3 Depth: (cm) 0.1 Volume: (cm) 1.649 [name], [name] ([id number]) [id number] Page 2 of 22 Character of Wound/Ulcer Post Debridement: Improved Post Procedure Diagnosis Same as Pre-procedure Notes scribed for Dr. Lady Gary by Brenton Grills, RN Electronic Signature(s) Signed: 04/26/2023 11:36:02 AM By: Duanne Guess MD FACS Signed: 05/08/2023 2:03:55 PM By: Brenton Grills Entered By: Brenton Grills on 04/26/2023 11:16:03 -------------------------------------------------------------------------------- Debridement Details Patient Name: Date of Service: Rojero, [name] 04/26/2023 10:15 A [name] Medical Record Number: [id number] Patient Account Number: [id number] Date of Birth/Sex: Treating RN: 10-29-1960 (62 y.o. Gevena Mart Primary Care Provider: [name] Other Clinician: Referring Provider: Treating Provider/Extender: Vivien Rossetti in Treatment: 40 Debridement Performed for Assessment: Wound #23 Midline Umbilicus Performed By: Physician Duanne Guess, MD Debridement Type: Debridement Level of Consciousness (Pre-procedure): Awake and Alert Pre-procedure Verification/Time Out Yes - 11:14 Taken: Start Time: 11:15 Pain Control: Lidocaine 4% T opical Solution Percent of Wound Bed Debrided: 100% T Area Debrided (cm): otal 4.95 Tissue and other material debrided: Viable, Non-Viable, Eschar, Slough, Slough Level: Non-Viable Tissue Debridement Description: Selective/Open Wound Instrument: Curette Bleeding: Minimum Hemostasis Achieved: Pressure End Time: 11:16 Procedural Pain: 0 Post Procedural Pain: 0 Response to Treatment: Procedure was tolerated well Level of Consciousness (Post- Awake and  Alert procedure): Post Debridement Measurements of Total Wound Length: (cm) 4.2 Width: (cm) 1.5 Depth: (cm) 0.1 Volume: (cm) 0.495 Character of Wound/Ulcer Post Debridement: Improved Post Procedure Diagnosis Same as Pre-procedure Notes scribed for Dr. Lady Gary by Brenton Grills, RN Electronic Signature(s) Signed: 04/26/2023 11:36:02 AM By: Duanne Guess MD FACS Signed: 05/08/2023 2:03:55 PM By: Brenton Grills Entered By: Brenton Grills on 04/26/2023 11:17:29 [name], [name] ([id number]) [id number] Page 3 of 22 -------------------------------------------------------------------------------- Debridement Details Patient Name: Date of Service: Hammer, [name] 04/26/2023 10:15 A [name] Medical Record Number: [id number] Patient Account Number: [id number] Date of Birth/Sex: Treating RN: 03/22/1961 (62 y.o. F)  Brenton Grills Primary Care Provider: [name] Other Clinician: Referring Provider: Treating Provider/Extender: Vivien Rossetti in Treatment: 40 Debridement Performed for Assessment: Wound #21 Medial Pubis Performed By: Physician Duanne Guess, MD Debridement Type: Debridement Level of Consciousness (Pre-procedure): Awake and Alert Pre-procedure Verification/Time Out Yes - 11:14 Taken: Start Time: 11:15 Pain Control: Lidocaine 4% T opical Solution Percent of Wound Bed Debrided: 100% T Area Debrided (cm): otal 74.47 Tissue and other material debrided: Viable, Non-Viable, Slough, Slough Level: Non-Viable Tissue Debridement Description: Selective/Open Wound Instrument: Curette Bleeding: Minimum Hemostasis Achieved: Pressure End Time: 11:16 Procedural Pain: 0 Post Procedural Pain: 0 Response to Treatment: Procedure was tolerated well Level of Consciousness (Post- Awake and Alert procedure): Post Debridement Measurements of Total Wound Length: (cm) 9.3 Stage: Category/Stage II Width: (cm) 10.2 Depth: (cm)  0.1 Volume: (cm) 7.45 Character of Wound/Ulcer Post Debridement: Improved Post Procedure Diagnosis Same as Pre-procedure Notes scribed for Dr. Lady Gary by Brenton Grills, RN Electronic Signature(s) Signed: 04/26/2023 11:36:02 AM By: Duanne Guess MD FACS Signed: 05/08/2023 2:03:55 PM By: Brenton Grills Entered By: Brenton Grills on 04/26/2023 11:18:46 -------------------------------------------------------------------------------- Debridement Details Patient Name: Date of Service: [name], [name] 04/26/2023 10:15 A [name] Medical Record Number: [id number] Patient Account Number: [id number] Date of Birth/Sex: Treating RN: 1960-10-22 (62 y.o. 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 Performed By: Physician Duanne Guess, MD Debridement Type: Debridement Level of Consciousness (Pre-procedure): Awake and Alert Pre-procedure Verification/Time Out Gair, Falesha [name] ([id number]) [id number] Page 4 of 22 Pre-procedure Verification/Time Out Yes - 11:14 Taken: Start Time: 11:15 Pain Control: Lidocaine 4% T opical Solution Percent of Wound Bed Debrided: 100% T Area Debrided (cm): otal 2.83 Tissue and other material debrided: Viable, Non-Viable, Slough, Biofilm, Slough Level: Non-Viable Tissue Debridement Description: Selective/Open Wound Instrument: Curette Bleeding: Minimum Hemostasis Achieved: Pressure End Time: 11:16 Procedural Pain: 0 Post Procedural Pain: 0 Response to Treatment: Procedure was tolerated well Level of Consciousness (Post- Awake and Alert procedure): Post Debridement Measurements of Total Wound Length: (cm) 1.9 Stage: Category/Stage II Width: (cm) 1.9 Depth: (cm) 0.1 Volume: (cm) 0.284 Character of Wound/Ulcer  Post Debridement: Improved Post Procedure Diagnosis Same as Pre-procedure Notes scribed for Dr. Lady Gary by Brenton Grills, RN Electronic Signature(s) Signed: 04/26/2023 11:36:02 AM By: Duanne Guess MD FACS Signed: 05/08/2023 2:03:55 PM By: Brenton Grills Entered By: Brenton Grills on 04/26/2023 11:19:53 -------------------------------------------------------------------------------- Debridement Details Patient Name: Date of Service: [name], [name] 04/26/2023 10:15 A [name] Medical Record Number: [id number] Patient Account Number: [id number] Date of Birth/Sex: Treating RN: 06-13-1961 (62 y.o. Gevena Mart Primary Care Provider: [name] Other Clinician: Referring Provider: Treating Provider/Extender: Vivien Rossetti in Treatment: 40 Debridement Performed for Assessment: Wound #25 Left Abdomen - Lower Quadrant Performed By: Physician Duanne Guess, MD Debridement Type: Debridement Level of Consciousness (Pre-procedure): Awake and Alert Pre-procedure Verification/Time Out Yes - 11:14 Taken: Start Time: 11:15 Pain Control: Lidocaine 4% T opical Solution Percent of Wound Bed Debrided: 100% T Area Debrided (cm): otal 14.9 Tissue and other material debrided: Viable, Non-Viable, Slough, Slough Level: Non-Viable Tissue Debridement Description: Selective/Open Wound Instrument: Curette Bleeding: Minimum Hemostasis Achieved: Pressure End Time: 11:16 Procedural Pain: 0 Post Procedural Pain: 0 Response to Treatment: Procedure was tolerated well Level of Consciousness (Post- Awake and Alert procedure): Whorley, Tamaka [name] ([id number]) [id number] Page 5 of 22 Post Debridement Measurements  of Total Wound Length: (cm) 7.3 Width: (cm) 2.6 Depth: (cm) 0.1 Volume: (cm) 1.491 Character of Wound/Ulcer Post Debridement: Improved Post Procedure Diagnosis Same as Pre-procedure Notes scribed for Dr. Lady Gary by Brenton Grills,  RN Electronic Signature(s) Signed: 04/26/2023 11:36:02 AM By: Duanne Guess MD FACS Signed: 05/08/2023 2:03:55 PM By: Brenton Grills Entered By: Brenton Grills on 04/26/2023 11:20:39 -------------------------------------------------------------------------------- Debridement Details Patient Name: Date of Service: [name], [name] 04/26/2023 10:15 A [name] Medical Record Number: [id number] Patient Account Number: [id number] Date of Birth/Sex: Treating RN: [birth year] (62 y.o. Gevena Mart Primary Care Provider: [name] Other Clinician: Referring Provider: Treating Provider/Extender: Vivien Rossetti in Treatment: 40 Debridement Performed for Assessment: Wound #22 Left,Medial Upper Leg Performed By: Physician Duanne Guess, MD Debridement Type: Debridement Level of Consciousness (Pre-procedure): Awake and Alert Pre-procedure Verification/Time Out Yes - 11:14 Taken: Start Time: 11:15 Pain Control: Lidocaine 4% T opical Solution Percent of Wound Bed Debrided: 100% T Area Debrided (cm): otal 1.26 Tissue and other material debrided: Viable, Non-Viable, Slough, Slough Level: Non-Viable Tissue Debridement Description: Selective/Open Wound Instrument: Curette Bleeding: Minimum Hemostasis Achieved: Pressure End Time: 11:16 Procedural Pain: 0 Post Procedural Pain: 0 Response to Treatment: Procedure was tolerated well Level of Consciousness (Post- Awake and Alert procedure): Post Debridement Measurements of Total Wound Length: (cm) 3.2 Stage: Category/Stage II Width: (cm) 0.5 Depth: (cm) 0.1 Volume: (cm) 0.126 Character of Wound/Ulcer Post Debridement: Improved Post Procedure Diagnosis Same as Pre-procedure Notes scribed for Dr. Lady Gary by Brenton Grills, RN Electronic Signature(s) Signed: 04/26/2023 11:36:02 AM By: Duanne Guess MD FACS Signed: 05/08/2023 2:03:55 PM By: Brenton Grills [name], [name] ([id number])  [id number] Page 6 of 22 Entered By: Brenton Grills on 04/26/2023 11:21:56 -------------------------------------------------------------------------------- Debridement Details Patient Name: Date of Service: [name], [name] 04/26/2023 10:15 A [name] Medical Record Number: [id number] Patient Account Number: [id number] Date of Birth/Sex: Treating RN: [birth year] (62 y.o. Gevena Mart Primary Care Provider: [name] Other Clinician: Referring Provider: Treating Provider/Extender: Vivien Rossetti in Treatment: 40 Debridement Performed for Assessment: Wound #24 Abdomen - midline Performed By: Physician Duanne Guess, MD Debridement Type: Debridement Level of Consciousness (Pre-procedure): Awake and Alert Pre-procedure Verification/Time Out Yes - 11:14 Taken: Start Time: 11:15 Pain Control: Lidocaine 4% T opical Solution Percent of Wound Bed Debrided: 100% T Area Debrided (cm): otal 3.45 Tissue and other material debrided: Viable, Non-Viable, Eschar, Slough, Slough Level: Non-Viable Tissue Debridement Description: Selective/Open Wound Instrument: Curette Bleeding: Minimum Hemostasis Achieved: Pressure End Time: 11:16 Procedural Pain: 0 Post Procedural Pain: 0 Response to Treatment: Procedure was tolerated well Level of Consciousness (Post- Awake and Alert procedure): Post Debridement Measurements of Total Wound Length: (cm) 0.8 Width: (cm) 5.5 Depth: (cm) 0.1 Volume: (cm) 0.346 Character of Wound/Ulcer Post Debridement: Improved Post Procedure Diagnosis Same as Pre-procedure Notes scribed for Dr. Lady Gary by Brenton Grills, RN Electronic Signature(s) Signed: 04/26/2023 11:36:02 AM By: Duanne Guess MD FACS Signed: 05/08/2023 2:03:55 PM By: Brenton Grills Entered By: Brenton Grills on 04/26/2023 11:22:53 -------------------------------------------------------------------------------- Debridement  Details Patient Name: Date of Service: [name], [name] 04/26/2023 10:15 A [name] Medical Record Number: [id number] Patient Account Number: [id number] Date of Birth/Sex: Treating RN: 06-Feb-1961 (62 y.o. Gevena Mart Primary Care Provider: [name] Other Clinician: Referring Provider: Treating Provider/Extender: Vivien Rossetti in Treatment: 40 Debridement Performed for Assessment: Wound #19 Left,Plantar Foot Stetzel, [name] ([id number]) [id number] Page 7 of 22 Performed By: Physician Duanne Guess, MD Debridement Type: Debridement Severity of Tissue  Pre Debridement: Fat layer exposed Level of Consciousness (Pre-procedure): Awake and Alert Pre-procedure Verification/Time Out Yes - 11:14 Taken: Start Time: 11:15 Pain Control: Lidocaine 4% T opical Solution Percent of Wound Bed Debrided: 100% T Area Debrided (cm): otal 35.8 Tissue and other material debrided: Viable, Non-Viable, Slough, Biofilm, Slough Level: Non-Viable Tissue Debridement Description: Selective/Open Wound Instrument: Curette Bleeding: Minimum Hemostasis Achieved: Pressure End Time: 11:16 Procedural Pain: 0 Post Procedural Pain: 0 Response to Treatment: Procedure was tolerated well Level of Consciousness (Post- Awake and Alert procedure): Post Debridement Measurements of Total Wound Length: (cm) 12 Width: (cm) 3.8 Depth: (cm) 0.2 Volume: (cm) 7.163 Character of Wound/Ulcer Post Debridement: Improved Severity of Tissue Post Debridement: Fat layer exposed Post Procedure Diagnosis Same as Pre-procedure Notes scribed for Dr. Lady Gary by Brenton Grills, RN Electronic Signature(s) Signed: 04/26/2023 11:36:02 AM By: Duanne Guess MD FACS Signed: 05/08/2023 2:03:55 PM By: Brenton Grills Entered By: Brenton Grills on 04/26/2023 11:24:22 -------------------------------------------------------------------------------- HPI Details Patient Name: Date  of Service: [name], [name] 04/26/2023 10:15 A [name] Medical Record Number: [id number] Patient Account Number: [id number] Date of Birth/Sex: Treating RN: 1961-03-24 (62 y.o. F) Primary Care Provider: [name] Other Clinician: Referring Provider: Treating Provider/Extender: Vivien Rossetti in Treatment: 40 History of Present Illness HPI Description: 07/23/2019 on evaluation today patient presents with a myriad of wounds noted at multiple locations over her right heel, right lower extremity, and abdominal region. She also has bilateral lower extremity lymphedema which is quite significant as well. Incidentally she also has congestive heart failure and hypertension. She also is obese. With that being said I think all this is contributing as well to her lower extremity edema which is very much uncontrolled. It seems like its been at least several months since she is worn any compression according to what she tells me. With that being said I am not sure exactly when that would have been. She does have fibrotic changes in the lower extremity secondary to lymphedema worse on the left than the right. She did show me pictures of wound she had on the anterior portion of her shin which was quite significant fortunately that has healed. These issues have been intermittent at this time. Again I think that with appropriate compression therapy she may actually be doing better than what we are seeing at this point but again I am not really sure that she is ever been extremely compliant with that. Fortunately there is no signs of active infection at this time. No fever chills noted. As far as the abdominal ulcer she initially had one wound that she thinks may have been a bug bite. Subsequently she was put a dressing on this and she states that the tape pulled skin off on other locations causing other wounds that have not healed that is been about 3 months. The wounds on her lower  extremities have been intermittent over 3 years. This includes the heel. 11/19; this is a patient that I have not seen previously. She was admitted to our clinic last week with multiple wounds including several superficial circular areas on her abdomen predominantly right upper quadrant. Also 1 in her umbilicus. She has an area on her right lateral malleolus which was new today. She has bilateral lower extremity edema with very significant stasis dermatitis on the left anterior tibial area. She has tightly adherent skin in her lower extremities Soderholm, Wylie [name] ([id number]) [id number] Page 8 of 22 probably secondary to cutaneous fibrosis in the area. We put  her in compression last week. She comes in with the dressings reasonably saturated. 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 She has multiple areas on her arms from scratching. 12/3; the patient I saw for the first time 2 weeks ago. She has 3 small circular areas on her abdomen 2 in the right upper quadrant one on her umbilicus. We have been using silver alginate to this area. She also had an area on her right lateral malleolus and right heel and right medial malleolus. We have been using silver alginate here under compression. She does not have an open area on the left leg today. We are going to order her stockings for the left leg. She clearly has chronic lymphedema in these areas. X-ray of the foot from 10/20 did not show any fracture or dislocation. The heel wound was noted there was no evidence of osteomyelitis She complains of generalized pruritus. 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 She has longstanding lymphedema. Although she does not have known liver disease I looked at  his CT scan of the abdomen from February of this year that showed hepatic steatosis 12/11; patient still has no open area on the left leg we transitioned her into a stocking she had. Her stockings from [employer] are supposed to be arriving later today which will be the stockings of choice. The only wound remaining on the right is the right heel 2 small superficial open areas. Everything else is well on its way to healing in the right leg few small excoriations. She has 1 major area remaining on her abdomen the rest seem to be healing 12/22 patient still has no open area on the left leg and she is still in a stocking. She had still 2 open areas on the tip of her right heel. These look like pressure related areas although the patient is not really certain how this is happening. She has an open heeled shoe that we provided. Still has 1 open area on the right lateral abdomen The patient has complaints of generalized pruritus. She has multiple excoriated areas on her abdomen that of close over her arms or thighs which I think are from scratching. She tells me that she has never had a basic work-up for this which might include basic lab work, liver functions, kidney functions, thyroid etc. She might also benefit from a dermatologist 12/29; the patient has a deeper larger more painful wound on the tip of her right heel. Again these look like pressure ulcers although she wears an open toed heel pads or heel at night to prevent it from hitting the mattress etc. They tell me and remind me that this is been present on and off for about 2 years that has closed over but then will reopen. I did look over her lab work that was available in [facility] link. She does not have elevated liver function tests or creatinine. I was not able to see a TSH. This was in response to her complaints of generalized itching and a itch/scratch cycle. I also noted that she is on OxyContin and oxycodone and of course narcotics can  cause generalized pruritus as a side effect Readmission: History From [place], [place] Last Week: 03/29/2021 this is a patient who presents for initial evaluation here in the clinic though I have seen  her 1 time previously in 2020 this was in [place]. That was actually her initial visit therefore a heel ulceration. Subsequently that did not healing she actually saw me the 1 time in November and then subsequently saw Dr. Leanord Hawking through the end of December where she apparently was healed. 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 This is on the right. With that being said right now she has a basically region on the lateral portion of the ray site where she is applying more pressure especially as her foot seems to be starting to turn in and this has become an increasingly significant issue for her to be honest. She does see Dr. Lajoyce Corners and Dr. Lajoyce Corners has had her on doxycycline for quite a bit of time here. Subsequently she is just now wrapping that out. I think that she probably would be benefited by continue the doxycycline for a time while we can work through what we need to do with things here. She does have evidence of osteomyelitis based on what I saw on the MRI today. This obviously is unfortunate and definitely not something that she was hoping to hear. With that being said I do believe that she would potentially be a strong candidate for hyperbaric oxygen therapy. Also believe she could be a candidate for total contact cast though we have to be cautious due to the fact that how her foot is bending inward I think that this is good to be a little bit of a concern. We will definitely have to keeping a close eye on things. She does have a history of diabetes mellitus type 2. She also other than the amputation has a medical history positive for hypertension. She has never undergone hyperbaric oxygen  therapy previously I did show her the chamber we did talk a little bit about it today as well. Admission T [facility]: o 04/06/2021 Patient presents here in [place] today for evaluation. Again she is establishing care here after I saw her last week in [place]. Obviously I think that she is doing extremely well at this point which is great news and in general I am extremely pleased with where things stand from the standpoint of the appearance of the wound. Obviously this is not significantly smaller but does look a lot cleaner I think using the Hydrofera Blue has been of benefit over the past week. Nonetheless she still has a wound with issues with pressure here which I think is good to be an ongoing issue. Also think that the osteomyelitis which is chronic is also getting ongoing issue. She does want to try to do what she can to prevent this from worsening and ending with a below-knee amputation. T that end I do think that getting her into the hyperbaric oxygen chamber would be what we need to do. She has recently been seen by cardiology o and subsequently well as far as that is concerned. Her ejection fraction was appropriate for hyperbarics and everything seems to be doing great in that regard. She has not had a recent chest x-ray she is a former smoker around 15 years ago she quit. Nonetheless I do believe with her asthma we do want to do a chest x-ray just to make sure everything is okay before proceeding with the hyperbarics although we can go ahead and see about getting the approval. I also think she is  going require some sharp debridement and around this wound we also have to contact her insurance for prior approval on this as well. 04/13/2021 upon evaluation today patient appears to be doing a little bit better in regard to her leg in fact the swelling is dramatically better. Very pleased with where things stand in that regard. Fortunately there does not appear to be any signs of  active infection at this time. No fevers, chills, nausea, vomiting, or diarrhea. 04/20/2021 upon evaluation today patient appears to be doing decently well in regard to her foot. There is some need for sharp debridement here today. With that being said we will get a go ahead and proceed with that today as the foot is becoming somewhat macerated with the overhanging callus that is definitely not we want to see. With that being said the patient does have an issue here as well with a boil in the perineal region unfortunately that is an issue for her today as well. I did have a look at that as well. We are still working on dealing with insurance as far as getting hyperbarics approved. 04/27/2021 upon evaluation today patient appears to be doing somewhat poorly in general compared to where she has been. At this point she is having a lot of swelling which is the main concerning thing that I am seeing. There does not appear to be any signs of infection currently which is good news but at the same time I do feel like that she is a lot more swollen than she was even last week and is showing how much she is weeping in regard to the right lower leg. She also has a blister on the left breast although is not an open wound at this time. She continues to have the area in the perineum as well. 05/04/2021 upon evaluation today patient appears to actually be doing decently well in regard to her wound on the foot. Fortunately there is no signs of active infection at this time. No fevers, chills, nausea, vomiting, or diarrhea. Unfortunately she does have an area on the left breast which is actually appearing to be a burn based on what I see physically. She does note that she uses rice bags for areas in general and may have left one in that area not realizing it was burning her as she does not really have much feeling. I think this is very probable based on what I am seeing. For that reason working to treat this as such I do  think we need to loosen up a lot of the necrotic tissue here. I Am going tosend in a prescription for Santyl for the patient. 9/1; patient presents for HBO today but cannot do treatment due to wheezing and feeling short of breath when laying down. She was set up as a doctor visit today since she was not able to do HBO. She states she feels okay overall and started having a dry cough over the past couple days. She has not tested herself for COVID. She denies fever/chills or sputum production. She thinks her symptoms are related to allergies. She has been using silver alginate to her right plantar foot wound. She has not been using Santyl to the breast wound because she reports forgetting to do this. She uses zinc oxide to her perennial wound. She currently denies systemic signs of infection. 9/8; patient presents for follow-up. She has been a unable to do HBO treatments for the past week due to her back pain.  She is currently taking Flexeril to address this issue. She reports no issues with her compression wrap. She has been putting Santyl on the left breast wound and Monistat cream to the perennial wound. She denies signs of infection. 9/15; patient presents for follow-up. Again she is unable to do HBO treatment today due to sinus pain. She has 2 new wounds to her right foot which she is unaware of. She has been putting Santyl on the left breast wound and zinc oxide to the perineal wound. She currently denies signs of infection. Readmission 10/17/2021 [name], [name] ([id number]) [id number] Page 9 of 22 Ms. Meganne Kuba is a 62 year old female with a past medical history of type 2 diabetes, osteomyelitis status post right BKA and morbid obesity that presents to the clinic for multiple scattered open wounds to her body. These are located to the abdomen, left posterior leg and sacral region. She has been using mupirocin ointment, Neosporin and zinc oxide to the wound beds. She  has been started on Bactrim and Keflex by her primary care physician and states she has several days left to complete the course. She currently denies systemic signs of infection. READMISSION This is a 62 year old morbidly obese type II diabetic (poorly controlled with last hemoglobin A1c 9.2%). She has congestive heart failure, peripheral vascular disease, hypertension, coronary artery disease, venous stasis, connective tissue disease (o Sjogren's syndrome), and bilateral lower extremity edema. She has been seen here for various reasons in the past. She has a right above-knee amputation. She was recently hospitalized for a left diabetic foot ulcer. Orthopedic surgery was consulted. An MRI was performed that was not conclusive for osteomyelitis, but given the extent of the necrosis, orthopedics recommended amputation. Infectious disease was also consulted and have recommended a 6-week course of oral doxycycline and Augmentin. Apparently wound care was also consulted while she was in the hospital and simply recommended painting the area with Betadine. She saw infectious disease on October 27 with plans to continue doxycycline on Augmentin to continue therapy until November 14. The infectious disease provider also recommended that the patient reconsider amputation. Her PCP referred her to the wound care center, as the patient is very reluctant to undergo amputation. She also has a small ulcer on her anterior tibial surface that recently opened after a minor trauma. On exam, her left foot is rolled such that the lateral aspect of her foot is the contact surface. There is a large ulcer with exposed necrotic muscle and fat. The wound does not probe to bone, but apparently there was bone exposure in the past while she was in the hospital. No malodor or purulent drainage. The wound on her anterior tibial surface is small with just a little slough accumulation. She has modest edema and skin changes consistent  with stasis dermatitis. 07/28/2022: The anterior tibial wound is healed. The left plantar foot wound looks a little bit better today. There is significantly less necrotic tissue present. There is an area at the most caudal aspect of the wound that looks like there is ongoing pressure-induced tissue injury. Bone remains covered. 08/11/2022: The wound has deteriorated quite a bit since her last visit. Her husband reports that she has had significantly more drainage, such that he has had to change the dressing multiple times per day. During the course of the visit, she recalled that she had not been taking her Lasix for at least 4 days. There is substantial periwound maceration and further tissue breakdown. There is necrotic tissue at the  midportion of the wound and tendon is now exposed underneath this necrotic muscle. 08/17/2022: The wound looks better this week. There is still an area of necrotic muscle and tendon in the midfoot, but the forefoot has improved with some epithelium beginning to come in from the margins. Unfortunately, there is evidence of ongoing pressure-induced tissue injury near the heel. The patient does admit to resting her heel on a stool frequently. She is still taking Bactrim as prescribed and her Keystone topical antibiotic compound should arrive at their home today. 12/14; fairly large sized wound on the left plantar foot. She has a right AKA. She uses a leg only for transfers she says she does not propel her wheelchair with that foot. She has been using Keystone, silver alginate and an Ace wrap. She has completed her Bactrim and Augmentin. She is a type II diabetic. The most worrisome part of this wound is a smaller area roughly 2 x 2 in the distal part of the wound which is 100% occupied by necrotic tendon 09/07/2022: She had an extensive debridement at her last visit. The wound is smaller today and all of the tissues appear healthier. 09/15/2022: The wound measurements are  about the same, but overall the surface appears healthier. There is still a little bit of the nonviable tendon exposed at the midfoot. 10/13/2022: The patient has been ill and unable to attend clinic for almost a month. Today, the wound measures smaller and there are buds of epithelium beginning to emerge, particularly at the calcaneus. There is a bridge of skin trying to come across the midportion of the wound, as well. Minimal slough accumulation. 10/26/2022: The absolute wound measurements have not changed, but the surface is technically smaller due to a bridge of epithelium that nearly divides the wound in two. There is some eschar at the medial edge of the calcaneal aspect of the wound, underneath which there is good epithelium. There is some slough on the wound surface, predominantly at the heel. 11/14/2022: She has been absent from clinic for couple of weeks due to health issues. There is more epithelium encroaching upon the edges of the wound. She has built up some eschar around the edges. There is thin biofilm and slough on the wound surface. 12/12/2022: The wound is narrower this week. There is some eschar and macerated callus around the edges with biofilm on the surface. There is a bridge of skin attempting to divide the wound into 2 sections. 12/26/2022: The wound on her plantar left foot is smaller and the bridge of skin between the 2 sections is getting wider. There is a little bit of eschar around the edges and thin slough on the surface. Unfortunately, she has multiple additional wounds that have actually been present for some time but she only just now is bringing them to our attention. She has wounds on her pubis and on the distal portion of her AKA stump as well as a wound on her mid abdomen. The pubis and AKA stump wounds look as though they are secondary to friction or pressure. The wound in her mid abdomen is of unclear etiology. It is quite dry. She has moisture-related tissue breakdown  in her intertriginous folds and there is a strong smell of yeast coming from her groin region. 01/10/2023: She feels poorly today and was unable to transfer from her wheelchair to the exam table. The only wounds we were able to assess today were those on her upper abdomen, the one at her umbilicus, and  the plantar left foot ulcer. The abdominal sites show evidence of picking and scratching. There is slough and eschar on all of the surfaces. The plantar ulcer on the left has some biofilm on the surface, but it continues to contract and epithelial bridge is larger. 01/25/2019: Multiple additional wounds were uncovered today. She has a cluster on her left upper abdomen, a larger 1 on her left breast, wounds in her intertriginous folds, additional wounds on her posterior left thigh and in the groin, along with more extensive wounds on her mons. The wound on her plantar foot is looking a little bit better, but does seem a bit dry. 02/08/2023: All of the wounds look a little bit better today. The 2 clusters of abdominal wounds all measured smaller as did the wound on her left breast. The wounds on her mons are clean and per measurements also a little bit smaller. The wound on her left medial ankle has dried up considerably and there are just a couple of the open sites remaining. The wound in her right groin has some slough accumulation as well as some fat necrosis. 03/13/2023: She has new wounds in her left groin that extend into the fat layer with slough accumulation. The foot has deteriorated; her husband says she had a period where there was a lot of drainage that resulted in tissue breakdown. She is clearly having some drainage from her left ankle, as well as there is slough accumulation on a cracked skin surface. She has a new wound on her left breast, just medial to the existing wound. She says she has no idea how it started but that she simply woke up one morning with a wound there and her entire breast  reddened. She denies any use of external heat source such as a heating pad. All of the other wounds are about the same with slough accumulation. The new breast wound has some eschar and fibrotic surface. The periumbilical wound also has some eschar accumulation. 04/26/2023: The right lower quadrant wound has healed. The other wounds are looking a little bit better in terms of their surfaces with minimal accumulation of slough and eschar. She is currently taking doxycycline prescribed by her PCP for cellulitis in her left leg. Electronic Signature(s) Signed: 04/26/2023 11:32:14 AM By: Duanne Guess MD FACS Entered By: Duanne Guess on 04/26/2023 11:32:14 [employer], [name] ([id number]) [id number] Page 10 of 22 -------------------------------------------------------------------------------- Physical Exam Details Patient Name: Date of Service: Pyatt, [name] 04/26/2023 10:15 A [name] Medical Record Number: [id number] Patient Account Number: [id number] Date of Birth/Sex: Treating RN: [birth year] (62 y.o. F) Primary Care Provider: [name] Other Clinician: Referring Provider: Treating Provider/Extender: Vivien Rossetti in Treatment: 40 Constitutional . . . . no acute distress. Respiratory Normal work of breathing on room air. Notes 04/26/2023: The right lower quadrant wound has healed. The other wounds are looking a little bit better in terms of their surfaces with minimal accumulation of slough and eschar. Electronic Signature(s) Signed: 04/26/2023 11:36:02 AM By: Duanne Guess MD FACS Entered By: Duanne Guess on 04/26/2023 11:32:49 -------------------------------------------------------------------------------- Physician Orders Details Patient Name: Date of Service: Sarnowski, [name] 04/26/2023 10:15 A [name] Medical Record Number: [id number] Patient Account Number: [id number] Date of Birth/Sex: Treating RN: [birth year] (62  y.o. 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 L97.112 Non-pressure chronic ulcer of right thigh with fat layer exposed L98.492 Non-pressure chronic ulcer of skin of other sites with fat layer exposed L97.822 Non-pressure chronic ulcer of other part of left lower leg with fat layer exposed L30.4 Erythema intertrigo M86.9 Osteomyelitis, unspecified E11.621 Type 2 diabetes mellitus with foot ulcer E66.01 Morbid (severe) obesity due to excess calories M35.00 Sjogren syndrome, unspecified I73.9 Peripheral vascular disease, unspecified I50.32 Chronic diastolic (congestive) heart failure 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 Follow-up Appointments Return appointment in 1 month. - **** EXTRA TIME at least 1.5 hrs COMPLEX patient**** Dr. Lady Gary Room 3 Other: - Pick up lidocaine at Pharmacy Anesthetic (In clinic) Topical Lidocaine 5% applied to wound bed Pauley, Aryan [name] ([id number]) [id number] Page 11 of 22 Bathing/ Shower/ Hygiene May shower and wash wound with soap and water. - with dressing changes Edema Control - Lymphedema / SCD / Other Elevate legs to the level of the heart or above for 30 minutes daily and/or when sitting for 3-4 times a day throughout the day. Avoid standing for long periods of time. Patient to wear own compression stockings every day. Off-Loading Other: - Try and elevate Left leg throughout the day Wound Treatment Wound #19 - Foot Wound Laterality: Plantar, Left Cleanser: Soap and Water 1 x Per Day/30 Days Discharge Instructions: May shower and wash wound with dial antibacterial soap and water prior to dressing  change. Cleanser: Wound Cleanser (Generic) 1 x Per Day/30 Days Discharge Instructions: Cleanse the wound with wound cleanser prior to applying a clean dressing using gauze sponges, not tissue or cotton balls. Prim Dressing: Maxorb Extra Ag+ Alginate Dressing, 4x4.75 (in/in) (Generic) 1 x Per Day/30 Days ary Discharge Instructions: Apply to wound bed as instructed Secondary Dressing: ABD Pad, 5x9 (Generic) 1 x Per Day/30 Days Discharge Instructions: Apply over primary dressing as directed. Secondary Dressing: Woven Gauze Sponge, Non-Sterile 4x4 in (Generic) 1 x Per Day/30 Days Discharge Instructions: Apply over primary dressing as directed. Secured With: Elastic Bandage 4 inch (ACE bandage) (Generic) 1 x Per Day/30 Days Discharge Instructions: Secure with ACE bandage as directed. Secured With: [employer], 4.5x3.1 (in/yd) (Generic) 1 x Per Day/30 Days Discharge Instructions: Secure with Kerlix as directed. Secured With: 6M Medipore Scientist, research (life sciences) Surgical T 2x10 (in/yd) (Generic) 1 x Per Day/30 Days ape Discharge Instructions: Secure with tape as directed. Wound #20 - Amputation Site - Above Knee Wound Laterality: Right Prim Dressing: Maxorb Extra Ag+ Alginate Dressing, 4x4.75 (in/in) 1 x Per Day/30 Days ary Discharge Instructions: Apply to wound bed as instructed Secondary Dressing: ABD Pad, 8x10 1 x Per Day/30 Days Discharge Instructions: Apply over primary dressing as directed. Secured With: 6M Medipore H Soft Cloth Surgical T ape, 4 x 10 (in/yd) 1 x Per Day/30 Days Discharge Instructions: Secure with tape as directed. Wound #21 - Pubis Wound Laterality: Medial Peri-Wound Care: Zinc Oxide Ointment 30g tube 1 x Per Day/30 Days Discharge Instructions: Apply Zinc Oxide to periwound with each dressing change Secondary Dressing: ABD Pad, 8x10 1 x Per Day/30 Days Discharge Instructions: Apply over primary dressing as directed. Secured With: 6M Medipore H Soft Cloth Surgical T ape, 4 x  10 (in/yd) 1 x Per Day/30 Days Discharge Instructions: Secure with tape as directed. Wound #23 - Umbilicus Wound Laterality: Midline Prim Dressing: Maxorb Extra Ag+ Alginate Dressing, 4x4.75 (in/in) 1 x Per Day/30 Days ary Discharge Instructions: Apply to wound bed as instructed Secondary Dressing: ABD Pad,  8x10 1 x Per Day/30 Days Discharge Instructions: Apply over primary dressing as directed. Secured With: 70M Medipore H Soft Cloth Surgical T ape, 4 x 10 (in/yd) 1 x Per Day/30 Days Discharge Instructions: Secure with tape as directed. Wound #24 - Abdomen - midline Topical: Santyl Collagenase Ointment, 30 (gm), tube 1 x Per Day/30 Days Discharge Instructions: Apply to crusty area Prim Dressing: Maxorb Extra Ag+ Alginate Dressing, 4x4.75 (in/in) 1 x Per Day/30 Days ary Discharge Instructions: Apply to wound bed as instructed Secondary Dressing: ABD Pad, 8x10 1 x Per Day/30 Days Emmer, Deronda [name] ([id number]) [id number] Page 12 of 22 Discharge Instructions: Apply over primary dressing as directed. Secured With: 70M Medipore H Soft Cloth Surgical T ape, 4 x 10 (in/yd) 1 x Per Day/30 Days Discharge Instructions: Secure with tape as directed. Wound #27 - Breast Wound Laterality: Left Topical: Santyl Collagenase Ointment, 30 (gm), tube 1 x Per Day/30 Days Discharge Instructions: Apply to the crusty wound on left breast Prim Dressing: Maxorb Extra Ag+ Alginate Dressing, 4x4.75 (in/in) 1 x Per Day/30 Days ary Discharge Instructions: Apply to wound bed as instructed Secondary Dressing: ABD Pad, 8x10 1 x Per Day/30 Days Discharge Instructions: Apply over primary dressing as directed. Secured With: 70M Medipore H Soft Cloth Surgical T ape, 4 x 10 (in/yd) 1 x Per Day/30 Days Discharge Instructions: Secure with tape as directed. Electronic Signature(s) Signed: 04/26/2023 11:36:02 AM By: Duanne Guess MD FACS Entered By: Duanne Guess on 04/26/2023  11:33:39 -------------------------------------------------------------------------------- Problem List Details Patient Name: Date of Service: Lakeman, [name] 04/26/2023 10:15 A [name] Medical Record Number: [id number] Patient Account Number: [id number] Date of Birth/Sex: Treating RN: [birth year] (62 y.o. 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 07/19/2022 No Yes L97.112 Non-pressure chronic ulcer of right thigh with fat layer exposed 12/26/2022 No Yes L98.492 Non-pressure chronic ulcer of skin of other sites with fat layer exposed 12/26/2022 No Yes L97.822 Non-pressure chronic ulcer of other part of left lower leg with fat layer exposed4/16/2024 No Yes L30.4 Erythema intertrigo 12/26/2022 No Yes M86.9 Osteomyelitis, unspecified 07/19/2022 No Yes E11.621 Type 2 diabetes mellitus with foot ulcer 07/19/2022 No Yes E66.01 Morbid (severe) obesity due to excess calories 07/19/2022 No Yes Ola, Zayley [name] ([id number]) [id number] Page 13 of 22 M35.00 Sjogren syndrome, unspecified 07/19/2022 No Yes I73.9 Peripheral vascular disease, unspecified 07/19/2022 No Yes I50.32 Chronic diastolic (congestive) heart failure 07/19/2022 No Yes 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 07/19/2022 No Yes Inactive Problems Resolved Problems Electronic Signature(s) Signed: 04/26/2023 11:29:06 AM By: Duanne Guess MD FACS Entered By: Duanne Guess on 04/26/2023 11:29:06 -------------------------------------------------------------------------------- Progress Note Details Patient Name: Date of Service: Welge, [name] 04/26/2023 10:15 A [name] Medical Record Number: [id number] Patient  Account Number: [id number] Date of Birth/Sex: Treating RN: 05/17/1961 (62 y.o. F) Primary Care Provider: [name] Other Clinician: Referring Provider: Treating Provider/Extender: Vivien Rossetti in Treatment: 40 Subjective Chief Complaint Information obtained from Patient 10/17/2021; multiple scattered wounds to the abdomen, posterior left leg, left labia and sacrum 07/19/2022: left foot DFU History of Present Illness (HPI) 07/23/2019 on evaluation today patient presents with a myriad of wounds noted at multiple locations over her right heel, right lower extremity, and abdominal region. She also has bilateral lower extremity lymphedema which  is quite significant as well. Incidentally she also has congestive heart failure and hypertension. She also is obese. With that being said I think all this is contributing as well to her lower extremity edema which is very much uncontrolled. It seems like its been at least several months since she is worn any compression according to what she tells me. With that being said I am not sure exactly when that would have been. She does have fibrotic changes in the lower extremity secondary to lymphedema worse on the left than the right. She did show me pictures of wound she had on the anterior portion of her shin which was quite significant fortunately that has healed. These issues have been intermittent at this time. Again I think that with appropriate compression therapy she may actually be doing better than what we are seeing at this point but again I am not really sure that she is ever been extremely compliant with that. Fortunately there is no signs of active infection at this time. No fever chills noted. As far as the abdominal ulcer she initially had one wound that she thinks may have been a bug bite. Subsequently she was put a dressing on this and she states that the tape pulled skin off on other locations causing other  wounds that have not healed that is been about 3 months. The wounds on her lower extremities have been intermittent over 3 years. This includes the heel. 11/19; this is a patient that I have not seen previously. She was admitted to our clinic last week with multiple wounds including several superficial circular areas on her abdomen predominantly right upper quadrant. Also 1 in her umbilicus. She has an area on her right lateral malleolus which was new today. She has bilateral lower extremity edema with very significant stasis dermatitis on the left anterior tibial area. She has tightly adherent skin in her lower extremities probably secondary to cutaneous fibrosis in the area. We put her in compression last week. She comes in with the dressings reasonably saturated. 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 She has multiple areas on her arms from scratching. 12/3; the patient I saw for the first time 2 weeks ago. She has 3 small circular areas on her abdomen 2 in the right upper quadrant one on her umbilicus. We have been using silver alginate to this area. She also had an area on her right lateral malleolus and right heel and right medial malleolus. We have been using silver alginate here under compression. She does not have an open area on the left leg today. We are going to order her stockings for the left leg. She clearly has chronic lymphedema in these areas. X-ray of the foot from 10/20 did not show any fracture or dislocation. The heel wound was noted there was no evidence of osteomyelitis She complains of generalized pruritus. 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 She has longstanding lymphedema. Although she does not have known liver disease I looked at his  CT scan of the abdomen from February of this year that showed hepatic steatosis 12/11; patient still has no open area on the left leg we transitioned her into a stocking she had. Her stockings from [employer] are supposed to be arriving later Gotay, Kena [name] ([id number]) [id number] Page 14 of 22  today which will be the stockings of choice. The only wound remaining on the right is the right heel 2 small superficial open areas. Everything else is well on its way to healing in the right leg few small excoriations. She has 1 major area remaining on her abdomen the rest seem to be healing 12/22 patient still has no open area on the left leg and she is still in a stocking. She had still 2 open areas on the tip of her right heel. These look like pressure related areas although the patient is not really certain how this is happening. She has an open heeled shoe that we provided. Still has 1 open area on the right lateral abdomen The patient has complaints of generalized pruritus. She has multiple excoriated areas on her abdomen that of close over her arms or thighs which I think are from scratching. She tells me that she has never had a basic work-up for this which might include basic lab work, liver functions, kidney functions, thyroid etc. She might also benefit from a dermatologist 12/29; the patient has a deeper larger more painful wound on the tip of her right heel. Again these look like pressure ulcers although she wears an open toed heel pads or heel at night to prevent it from hitting the mattress etc. They tell me and remind me that this is been present on and off for about 2 years that has closed over but then will reopen. I did look over her lab work that was available in [facility] link. She does not have elevated liver function tests or creatinine. I was not able to see a TSH. This was in response to her complaints of generalized itching and a itch/scratch cycle. I also  noted that she is on OxyContin and oxycodone and of course narcotics can cause generalized pruritus as a side effect Readmission: History From [place], [place] Last Week: 03/29/2021 this is a patient who presents for initial evaluation here in the clinic though I have seen her 1 time previously in 2020 this was in [place]. That was actually her initial visit therefore a heel ulceration. Subsequently that did not healing she actually saw me the 1 time in November and then subsequently saw Dr. Leanord Hawking through the end of December where she apparently was healed. 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 This is on the right. With that being said right now she has a basically region on the lateral portion of the ray site where she is applying more pressure especially as her foot seems to be starting to turn in and this has become an increasingly significant issue for her to be honest. She does see Dr. Lajoyce Corners and Dr. Lajoyce Corners has had her on doxycycline for quite a bit of time here. Subsequently she is just now wrapping that out. I think that she probably would be benefited by continue the doxycycline for a time while we can work through what we need to do with things here. She does have evidence of osteomyelitis based on what I saw on the MRI today. This obviously is unfortunate and definitely not something that she was hoping to hear. With that being said I do believe that she would potentially be a strong candidate for hyperbaric oxygen therapy. Also believe she could be a candidate for total contact cast though we have to be cautious due  to the fact that how her foot is bending inward I think that this is good to be a little bit of a concern. We will definitely have to keeping a close eye on things. She does have a history of diabetes mellitus type 2. She also other than the amputation has a medical  history positive for hypertension. She has never undergone hyperbaric oxygen therapy previously I did show her the chamber we did talk a little bit about it today as well. Admission [facility]: o 04/06/2021 Patient presents here in [place] today for evaluation. Again she is establishing care here after I saw her last week in [place]. Obviously I think that she is doing extremely well at this point which is great news and in general I am extremely pleased with where things stand from the standpoint of the appearance of the wound. Obviously this is not significantly smaller but does look a lot cleaner I think using the Hydrofera Blue has been of benefit over the past week. Nonetheless she still has a wound with issues with pressure here which I think is good to be an ongoing issue. Also think that the osteomyelitis which is chronic is also getting ongoing issue. She does want to try to do what she can to prevent this from worsening and ending with a below-knee amputation. T that end I do think that getting her into the hyperbaric oxygen chamber would be what we need to do. She has recently been seen by cardiology o and subsequently well as far as that is concerned. Her ejection fraction was appropriate for hyperbarics and everything seems to be doing great in that regard. She has not had a recent chest x-ray she is a former smoker around 15 years ago she quit. Nonetheless I do believe with her asthma we do want to do a chest x-ray just to make sure everything is okay before proceeding with the hyperbarics although we can go ahead and see about getting the approval. I also think she is going require some sharp debridement and around this wound we also have to contact her insurance for prior approval on this as well. 04/13/2021 upon evaluation today patient appears to be doing a little bit better in regard to her leg in fact the swelling is dramatically better. Very pleased with where things  stand in that regard. Fortunately there does not appear to be any signs of active infection at this time. No fevers, chills, nausea, vomiting, or diarrhea. 04/20/2021 upon evaluation today patient appears to be doing decently well in regard to her foot. There is some need for sharp debridement here today. With that being said we will get a go ahead and proceed with that today as the foot is becoming somewhat macerated with the overhanging callus that is definitely not we want to see. With that being said the patient does have an issue here as well with a boil in the perineal region unfortunately that is an issue for her today as well. I did have a look at that as well. We are still working on dealing with insurance as far as getting hyperbarics approved. 04/27/2021 upon evaluation today patient appears to be doing somewhat poorly in general compared to where she has been. At this point she is having a lot of swelling which is the main concerning thing that I am seeing. There does not appear to be any signs of infection currently which is good news but at the same time I do feel  like that she is a lot more swollen than she was even last week and is showing how much she is weeping in regard to the right lower leg. She also has a blister on the left breast although is not an open wound at this time. She continues to have the area in the perineum as well. 05/04/2021 upon evaluation today patient appears to actually be doing decently well in regard to her wound on the foot. Fortunately there is no signs of active infection at this time. No fevers, chills, nausea, vomiting, or diarrhea. Unfortunately she does have an area on the left breast which is actually appearing to be a burn based on what I see physically. She does note that she uses rice bags for areas in general and may have left one in that area not realizing it was burning her as she does not really have much feeling. I think this is very probable  based on what I am seeing. For that reason working to treat this as such I do think we need to loosen up a lot of the necrotic tissue here. I Am going tosend in a prescription for Santyl for the patient. 9/1; patient presents for HBO today but cannot do treatment due to wheezing and feeling short of breath when laying down. She was set up as a doctor visit today since she was not able to do HBO. She states she feels okay overall and started having a dry cough over the past couple days. She has not tested herself for COVID. She denies fever/chills or sputum production. She thinks her symptoms are related to allergies. She has been using silver alginate to her right plantar foot wound. She has not been using Santyl to the breast wound because she reports forgetting to do this. She uses zinc oxide to her perennial wound. She currently denies systemic signs of infection. 9/8; patient presents for follow-up. She has been a unable to do HBO treatments for the past week due to her back pain. She is currently taking Flexeril to address this issue. She reports no issues with her compression wrap. She has been putting Santyl on the left breast wound and Monistat cream to the perennial wound. She denies signs of infection. 9/15; patient presents for follow-up. Again she is unable to do HBO treatment today due to sinus pain. She has 2 new wounds to her right foot which she is unaware of. She has been putting Santyl on the left breast wound and zinc oxide to the perineal wound. She currently denies signs of infection. Readmission 10/17/2021 Ms. Dorotea Skufca is a 62 year old female with a past medical history of type 2 diabetes, osteomyelitis status post right BKA and morbid obesity that presents to the clinic for multiple scattered open wounds to her body. These are located to the abdomen, left posterior leg and sacral region. She has been using mupirocin ointment, Neosporin and zinc oxide to the wound beds. She has  been started on Bactrim and Keflex by her primary care physician and states she has several days left to complete the course. She currently denies systemic signs of infection. READMISSION This is a 62 year old morbidly obese type II diabetic (poorly controlled with last hemoglobin A1c 9.2%). She has congestive heart failure, peripheral vascular disease, hypertension, coronary artery disease, venous stasis, connective tissue disease (o Sjogren's syndrome), and bilateral lower extremity edema. She has been seen here for various reasons in the past. She has a right above-knee amputation. She was recently hospitalized  for a left diabetic foot ulcer. Orthopedic surgery was consulted. An MRI was performed that was not conclusive for osteomyelitis, but given the extent of the necrosis, orthopedics recommended amputation. Infectious disease was also consulted and have recommended a 6-week course of oral doxycycline and Augmentin. Apparently Burkman, Lynde [name] ([id number]) [id number] Page 15 of 22 wound care was also consulted while she was in the hospital and simply recommended painting the area with Betadine. She saw infectious disease on October 27 with plans to continue doxycycline on Augmentin to continue therapy until November 14. The infectious disease provider also recommended that the patient reconsider amputation. Her PCP referred her to the wound care center, as the patient is very reluctant to undergo amputation. She also has a small ulcer on her anterior tibial surface that recently opened after a minor trauma. On exam, her left foot is rolled such that the lateral aspect of her foot is the contact surface. There is a large ulcer with exposed necrotic muscle and fat. The wound does not probe to bone, but apparently there was bone exposure in the past while she was in the hospital. No malodor or purulent drainage. The wound on her anterior tibial surface is small with just  a little slough accumulation. She has modest edema and skin changes consistent with stasis dermatitis. 07/28/2022: The anterior tibial wound is healed. The left plantar foot wound looks a little bit better today. There is significantly less necrotic tissue present. There is an area at the most caudal aspect of the wound that looks like there is ongoing pressure-induced tissue injury. Bone remains covered. 08/11/2022: The wound has deteriorated quite a bit since her last visit. Her husband reports that she has had significantly more drainage, such that he has had to change the dressing multiple times per day. During the course of the visit, she recalled that she had not been taking her Lasix for at least 4 days. There is substantial periwound maceration and further tissue breakdown. There is necrotic tissue at the midportion of the wound and tendon is now exposed underneath this necrotic muscle. 08/17/2022: The wound looks better this week. There is still an area of necrotic muscle and tendon in the midfoot, but the forefoot has improved with some epithelium beginning to come in from the margins. Unfortunately, there is evidence of ongoing pressure-induced tissue injury near the heel. The patient does admit to resting her heel on a stool frequently. She is still taking Bactrim as prescribed and her Keystone topical antibiotic compound should arrive at their home today. 12/14; fairly large sized wound on the left plantar foot. She has a right AKA. She uses a leg only for transfers she says she does not propel her wheelchair with that foot. She has been using Keystone, silver alginate and an Ace wrap. She has completed her Bactrim and Augmentin. She is a type II diabetic. The most worrisome part of this wound is a smaller area roughly 2 x 2 in the distal part of the wound which is 100% occupied by necrotic tendon 09/07/2022: She had an extensive debridement at her last visit. The wound is smaller today and  all of the tissues appear healthier. 09/15/2022: The wound measurements are about the same, but overall the surface appears healthier. There is still a little bit of the nonviable tendon exposed at the midfoot. 10/13/2022: The patient has been ill and unable to attend clinic for almost a month. Today, the wound measures smaller and there are buds of epithelium  beginning to emerge, particularly at the calcaneus. There is a bridge of skin trying to come across the midportion of the wound, as well. Minimal slough accumulation. 10/26/2022: The absolute wound measurements have not changed, but the surface is technically smaller due to a bridge of epithelium that nearly divides the wound in two. There is some eschar at the medial edge of the calcaneal aspect of the wound, underneath which there is good epithelium. There is some slough on the wound surface, predominantly at the heel. 11/14/2022: She has been absent from clinic for couple of weeks due to health issues. There is more epithelium encroaching upon the edges of the wound. She has built up some eschar around the edges. There is thin biofilm and slough on the wound surface. 12/12/2022: The wound is narrower this week. There is some eschar and macerated callus around the edges with biofilm on the surface. There is a bridge of skin attempting to divide the wound into 2 sections. 12/26/2022: The wound on her plantar left foot is smaller and the bridge of skin between the 2 sections is getting wider. There is a little bit of eschar around the edges and thin slough on the surface. Unfortunately, she has multiple additional wounds that have actually been present for some time but she only just now is bringing them to our attention. She has wounds on her pubis and on the distal portion of her AKA stump as well as a wound on her mid abdomen. The pubis and AKA stump wounds look as though they are secondary to friction or pressure. The wound in her mid abdomen is of  unclear etiology. It is quite dry. She has moisture-related tissue breakdown in her intertriginous folds and there is a strong smell of yeast coming from her groin region. 01/10/2023: She feels poorly today and was unable to transfer from her wheelchair to the exam table. The only wounds we were able to assess today were those on her upper abdomen, the one at her umbilicus, and the plantar left foot ulcer. The abdominal sites show evidence of picking and scratching. There is slough and eschar on all of the surfaces. The plantar ulcer on the left has some biofilm on the surface, but it continues to contract and epithelial bridge is larger. 01/25/2019: Multiple additional wounds were uncovered today. She has a cluster on her left upper abdomen, a larger 1 on her left breast, wounds in her intertriginous folds, additional wounds on her posterior left thigh and in the groin, along with more extensive wounds on her mons. The wound on her plantar foot is looking a little bit better, but does seem a bit dry. 02/08/2023: All of the wounds look a little bit better today. The 2 clusters of abdominal wounds all measured smaller as did the wound on her left breast. The wounds on her mons are clean and per measurements also a little bit smaller. The wound on her left medial ankle has dried up considerably and there are just a couple of the open sites remaining. The wound in her right groin has some slough accumulation as well as some fat necrosis. 03/13/2023: She has new wounds in her left groin that extend into the fat layer with slough accumulation. The foot has deteriorated; her husband says she had a period where there was a lot of drainage that resulted in tissue breakdown. She is clearly having some drainage from her left ankle, as well as there is slough accumulation on a cracked skin  surface. She has a new wound on her left breast, just medial to the existing wound. She says she has no idea how it started but  that she simply woke up one morning with a wound there and her entire breast reddened. She denies any use of external heat source such as a heating pad. All of the other wounds are about the same with slough accumulation. The new breast wound has some eschar and fibrotic surface. The periumbilical wound also has some eschar accumulation. 04/26/2023: The right lower quadrant wound has healed. The other wounds are looking a little bit better in terms of their surfaces with minimal accumulation of slough and eschar. She is currently taking doxycycline prescribed by her PCP for cellulitis in her left leg. Patient History Information obtained from Patient. Family History Diabetes - Mother, Heart Disease - Mother,Father,Siblings, Hypertension - Mother,Father,Siblings, Lung Disease - Father, Stroke - Mother, No family history of Cancer, Hereditary Spherocytosis, Kidney Disease, Seizures, Thyroid Problems, Tuberculosis. Social History Former smoker - quit 15 years ago, Marital Status - Married, Alcohol Use - Never, Drug Use - No History, Caffeine Use - Rarely. Medical History Eyes Patient has history of Cataracts - both eyes Denies history of Glaucoma, Optic Neuritis Ear/Nose/Mouth/Throat Denies history of Chronic sinus problems/congestion, Middle ear problems Hematologic/Lymphatic Patient has history of Lymphedema Denies history of Anemia, Hemophilia, Human Immunodeficiency Virus, Sickle Cell Disease Holstad, Geneviene [name] ([id number]) [id number] Page 16 of 22 Respiratory Patient has history of Asthma Denies history of Aspiration, Chronic Obstructive Pulmonary Disease (COPD), Pneumothorax, Sleep Apnea, Tuberculosis Cardiovascular Patient has history of Congestive Heart Failure, Coronary Artery Disease, Hypertension, Peripheral Arterial Disease, Peripheral Venous Disease Denies history of Angina, Arrhythmia, Hypotension, Myocardial Infarction, Phlebitis,  Vasculitis Gastrointestinal Denies history of Hepatitis A, Hepatitis B, Hepatitis C Endocrine Patient has history of Type II Diabetes Denies history of Type I Diabetes Genitourinary Denies history of End Stage Renal Disease Immunological Denies history of Lupus Erythematosus, Raynauds, Scleroderma Integumentary (Skin) Denies history of History of Burn Musculoskeletal Patient has history of Rheumatoid Arthritis Denies history of Gout, Osteoarthritis, Osteomyelitis Neurologic Patient has history of Neuropathy Denies history of Dementia, Quadriplegia, Paraplegia, Seizure Disorder Oncologic Denies history of Received Chemotherapy, Received Radiation Psychiatric Denies history of Anorexia/bulimia, Confinement Anxiety Hospitalization/Surgery History - 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 Lajoyce Corners 01/2020. 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 - 11/22 R AKA. Medical A Surgical History Notes nd Constitutional Symptoms (General Health) hypothyroidism Cardiovascular cardiac stents Gastrointestinal GERD Endocrine Hypothyroidism Immunological Mix connective tissue disease- autoimmune Sjogren's syndrome Musculoskeletal Fibromyalgia, Scoliosis, Objective Constitutional no acute distress. Vitals Time Taken: 10:30 AM, Height: 62 in, Weight: 284 lbs, BMI: 51.9, Temperature: 98.3 F, Pulse: 94 bpm, Respiratory Rate: 18 breaths/min, Blood Pressure: 122/67 mmHg. Respiratory Normal work of breathing on room air. General Notes: 04/26/2023: The right lower quadrant wound has healed. The other wounds are looking a little bit better in terms of their surfaces with minimal accumulation of slough and eschar. Integumentary (Hair, Skin) Wound #19 status is Open. Original cause of wound was Gradually Appeared. The date acquired was: 05/17/2022. The wound has been in treatment 40 weeks. The wound is located on the Left,Plantar Foot. The wound measures 12cm length x 3.8cm width x 0.2cm depth;  35.814cm^2 area and 7.163cm^3 volume. There is Fat Layer (Subcutaneous Tissue) exposed. There is no tunneling or undermining noted. There is a medium amount of serosanguineous drainage noted. The wound margin is distinct with the outline attached to the wound base.  There is large (67-100%) pink granulation within the wound bed. There is a small (1-33%) amount of necrotic tissue within the wound bed including Adherent Slough. The periwound skin appearance had no abnormalities noted for moisture. The periwound skin appearance exhibited: Callus, Scarring. The periwound skin appearance did not exhibit: Crepitus, Excoriation, Induration, Rash, Atrophie Blanche, Cyanosis, Ecchymosis, Hemosiderin Staining, Mottled, Pallor, Rubor, Erythema. Periwound temperature was noted as No Abnormality. Wound #20 status is Open. Original cause of wound was Pressure Injury. The date acquired was: 12/14/2022. The wound has been in treatment 17 weeks. The wound is located on the Right Amputation Site - Above Knee. The wound measures 1.9cm length x 1.9cm width x 0.1cm depth; 2.835cm^2 area and 0.284cm^3 volume. There is Fat Layer (Subcutaneous Tissue) exposed. There is no tunneling or undermining noted. There is a medium amount of serosanguineous drainage noted. There is large (67-100%) red granulation within the wound bed. There is a small (1-33%) amount of necrotic tissue within the wound bed including Adherent Slough. The periwound skin appearance had no abnormalities noted for moisture. The periwound skin appearance had no abnormalities noted for color. The periwound skin appearance exhibited: Scarring. Wound #21 status is Open. Original cause of wound was Pressure Injury. The date acquired was: 12/14/2022. The wound has been in treatment 17 weeks. The wound is located on the Medial Pubis. The wound measures 9.3cm length x 10.2cm width x 0.1cm depth; 74.503cm^2 area and 7.45cm^3 volume. There is Fat Layer (Subcutaneous Tissue)  exposed. There is no tunneling or undermining noted. There is a medium amount of serosanguineous drainage noted. There is large (67-100%) red granulation within the wound bed. There is a small (1-33%) amount of necrotic tissue within the wound bed including Adherent Slough. The periwound skin appearance had no abnormalities noted for moisture. The periwound skin appearance had no abnormalities noted for color. The periwound skin Cicero, Onelia [name] ([id number]) [id number] Page 17 of 22 appearance exhibited: Scarring. Periwound temperature was noted as No Abnormality. Wound #22 status is Open. Original cause of wound was Pressure Injury. The date acquired was: 12/14/2022. The wound has been in treatment 17 weeks. The wound is located on the Left,Medial Upper Leg. The wound measures 3.2cm length x 0.5cm width x 0.1cm depth; 1.257cm^2 area and 0.126cm^3 volume. There is Fat Layer (Subcutaneous Tissue) exposed. There is no tunneling or undermining noted. There is a medium amount of serosanguineous drainage noted. There is large (67-100%) red granulation within the wound bed. There is a small (1-33%) amount of necrotic tissue within the wound bed including Adherent Slough. The periwound skin appearance had no abnormalities noted for moisture. The periwound skin appearance had no abnormalities noted for color. The periwound skin appearance exhibited: Scarring. Periwound temperature was noted as No Abnormality. Wound #23 status is Open. Original cause of wound was Other Lesion. The date acquired was: 12/14/2022. The wound has been in treatment 17 weeks. The wound is located on the Midline Umbilicus. The wound measures 4.2cm length x 1.5cm width x 0.1cm depth; 4.948cm^2 area and 0.495cm^3 volume. There is Fat Layer (Subcutaneous Tissue) exposed. There is no tunneling or undermining noted. There is a medium amount of serosanguineous drainage noted. There is medium (34-66%) pink granulation  within the wound bed. There is a medium (34-66%) amount of necrotic tissue within the wound bed. The periwound skin appearance had no abnormalities noted for texture. The periwound skin appearance had no abnormalities noted for moisture. The periwound skin appearance had no abnormalities noted for color. Periwound temperature  was noted as No Abnormality. Wound #24 status is Open. Original cause of wound was Other Lesion. The date acquired was: 12/14/2022. The wound has been in treatment 17 weeks. The wound is located on the Abdomen - midline. The wound measures 0.8cm length x 5.5cm width x 0.1cm depth; 3.456cm^2 area and 0.346cm^3 volume. There is Fat Layer (Subcutaneous Tissue) exposed. There is no tunneling or undermining noted. There is a medium amount of serosanguineous drainage noted. There is large (67-100%) red granulation within the wound bed. There is a small (1-33%) amount of necrotic tissue within the wound bed including Eschar and Adherent Slough. The periwound skin appearance had no abnormalities noted for texture. The periwound skin appearance had no abnormalities noted for moisture. The periwound skin appearance had no abnormalities noted for color. Periwound temperature was noted as No Abnormality. Wound #25 status is Open. Original cause of wound was Gradually Appeared. The date acquired was: 01/25/2023. The wound has been in treatment 13 weeks. The wound is located on the Left Abdomen - Lower Quadrant. The wound measures 7.3cm length x 2.6cm width x 0.1cm depth; 14.907cm^2 area and 1.491cm^3 volume. There is Fat Layer (Subcutaneous Tissue) exposed. There is no tunneling or undermining noted. There is a medium amount of serosanguineous drainage noted. There is small (1-33%) red granulation within the wound bed. There is a large (67-100%) amount of necrotic tissue within the wound bed including Adherent Slough. The periwound skin appearance had no abnormalities noted for texture. The  periwound skin appearance had no abnormalities noted for moisture. The periwound skin appearance had no abnormalities noted for color. Periwound temperature was noted as No Abnormality. Wound #26 status is Healed - Epithelialized. Original cause of wound was Gradually Appeared. The date acquired was: 01/25/2023. The wound has been in treatment 13 weeks. The wound is located on the Right Abdomen - Lower Quadrant. The wound measures 0cm length x 0cm width x 0cm depth; 0cm^2 area and 0cm^3 volume. There is Fat Layer (Subcutaneous Tissue) exposed. There is no tunneling or undermining noted. There is a medium amount of serosanguineous drainage noted. There is large (67-100%) red granulation within the wound bed. There is a small (1-33%) amount of necrotic tissue within the wound bed including Adherent Slough. The periwound skin appearance had no abnormalities noted for texture. The periwound skin appearance had no abnormalities noted for moisture. The periwound skin appearance had no abnormalities noted for color. Periwound temperature was noted as No Abnormality. Wound #27 status is Open. Original cause of wound was Gradually Appeared. The date acquired was: 01/25/2023. The wound has been in treatment 13 weeks. The wound is located on the Left Breast. The wound measures 7cm length x 3cm width x 0.1cm depth; 16.493cm^2 area and 1.649cm^3 volume. There is Fat Layer (Subcutaneous Tissue) exposed. There is no tunneling or undermining noted. There is a medium amount of serosanguineous drainage noted. There is medium (34-66%) red granulation within the wound bed. There is a medium (34-66%) amount of necrotic tissue within the wound bed including Eschar and Adherent Slough. The periwound skin appearance had no abnormalities noted for texture. The periwound skin appearance had no abnormalities noted for moisture. The periwound skin appearance had no abnormalities noted for color. Periwound temperature was noted as No  Abnormality. 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 Non-pressure chronic ulcer of skin of other sites with fat layer exposed Non-pressure chronic  ulcer of other part of left lower leg with fat layer exposed Erythema intertrigo Osteomyelitis, unspecified Type 2 diabetes mellitus with foot ulcer Morbid (severe) obesity due to excess calories Sjogren syndrome, unspecified Peripheral vascular disease, unspecified Chronic diastolic (congestive) heart failure Acquired absence of right leg above knee Procedures Wound #19 Pre-procedure diagnosis of Wound #19 is a Diabetic Wound/Ulcer of the Lower Extremity located on the Left,Plantar Foot .Severity of Tissue Pre Debridement is: Fat layer exposed. There was a Selective/Open Wound Non-Viable Tissue Debridement with a total area of 35.8 sq cm performed by Duanne Guess, MD. With the following instrument(s): Curette to remove Viable and Non-Viable tissue/material. Material removed includes Slough and Biofilm and after achieving pain control using Lidocaine 4% T opical Solution. No specimens were taken. A time out was conducted at 11:14, prior to the start of the procedure. A Minimum amount of bleeding was controlled with Pressure. The procedure was tolerated well with a pain level of 0 throughout and a pain level of 0 following the procedure. Post Debridement Measurements: 12cm length x 3.8cm width x 0.2cm depth; 7.163cm^3 volume. Character of Wound/Ulcer Post Debridement is improved. Severity of Tissue Post Debridement is: Fat layer exposed. Post procedure Diagnosis Wound #19: Same as Pre-Procedure General Notes: scribed for Dr. Lady Gary by Brenton Grills, RN. Wound #20 Pre-procedure diagnosis of Wound #20 is a Pressure Ulcer located on the Right Amputation Site - Above Knee . There was a Selective/Open Wound  Non-Viable Tissue Debridement with a total area of 2.83 sq cm performed by Duanne Guess, MD. With the following instrument(s): Curette to remove Viable and Non- Viable tissue/material. Material removed includes Slough and Biofilm and after achieving pain control using Lidocaine 4% Topical Solution. No specimens were taken. A time out was conducted at 11:14, prior to the start of the procedure. A Minimum amount of bleeding was controlled with Pressure. The procedure was tolerated well with a pain level of 0 throughout and a pain level of 0 following the procedure. Post Debridement Measurements: 1.9cm length x 1.9cm width x 0.1cm depth; 0.284cm^3 volume. Post debridement Stage noted as Category/Stage II. Cutter, Reha [name] ([id number]) [id number] Page 18 of 22 Character of Wound/Ulcer Post Debridement is improved. Post procedure Diagnosis Wound #20: Same as Pre-Procedure General Notes: scribed for Dr. Lady Gary by Brenton Grills, RN. Wound #21 Pre-procedure diagnosis of Wound #21 is a Pressure Ulcer located on the Medial Pubis . There was a Selective/Open Wound Non-Viable Tissue Debridement with a total area of 74.47 sq cm performed by Duanne Guess, MD. With the following instrument(s): Curette to remove Viable and Non-Viable tissue/material. Material removed includes [facility] after achieving pain control using Lidocaine 4% Topical Solution. No specimens were taken. A time out was conducted at 11:14, prior to the start of the procedure. A Minimum amount of bleeding was controlled with Pressure. The procedure was tolerated well with a pain level of 0 throughout and a pain level of 0 following the procedure. Post Debridement Measurements: 9.3cm length x 10.2cm width x 0.1cm depth; 7.45cm^3 volume. Post debridement Stage noted as Category/Stage II. Character of Wound/Ulcer Post Debridement is improved. Post procedure Diagnosis Wound #21: Same as Pre-Procedure General Notes:  scribed for Dr. Lady Gary by Brenton Grills, RN. Wound #22 Pre-procedure diagnosis of Wound #22 is a Pressure Ulcer located on the Left,Medial Upper Leg . There was a Selective/Open Wound Non-Viable Tissue Debridement with a total area of 1.26 sq cm performed by Duanne Guess, MD. With the following instrument(s): Curette to remove Viable and  Non-Viable tissue/material. Material removed includes Slough after achieving pain control using Lidocaine 4% Topical Solution. No specimens were taken. A time out was conducted at 11:14, prior to the start of the procedure. A Minimum amount of bleeding was controlled with Pressure. The procedure was tolerated well with a pain level of 0 throughout and a pain level of 0 following the procedure. Post Debridement Measurements: 3.2cm length x 0.5cm width x 0.1cm depth; 0.126cm^3 volume. Post debridement Stage noted as Category/Stage II. Character of Wound/Ulcer Post Debridement is improved. Post procedure Diagnosis Wound #22: Same as Pre-Procedure General Notes: scribed for Dr. Lady Gary by Brenton Grills, RN. Wound #23 Pre-procedure diagnosis of Wound #23 is an Abrasion located on the Midline Umbilicus . There was a Selective/Open Wound Non-Viable Tissue Debridement with a total area of 4.95 sq cm performed by Duanne Guess, MD. With the following instrument(s): Curette to remove Viable and Non-Viable tissue/material. Material removed includes Eschar and Slough and after achieving pain control using Lidocaine 4% T opical Solution. No specimens were taken. A time out was conducted at 11:14, prior to the start of the procedure. A Minimum amount of bleeding was controlled with Pressure. The procedure was tolerated well with a pain level of 0 throughout and a pain level of 0 following the procedure. Post Debridement Measurements: 4.2cm length x 1.5cm width x 0.1cm depth; 0.495cm^3 volume. Character of Wound/Ulcer Post Debridement is improved. Post procedure Diagnosis  Wound #23: Same as Pre-Procedure General Notes: scribed for Dr. Lady Gary by Brenton Grills, RN. Wound #24 Pre-procedure diagnosis of Wound #24 is a Lesion located on the Abdomen - midline . There was a Selective/Open Wound Non-Viable Tissue Debridement with a total area of 3.45 sq cm performed by Duanne Guess, MD. With the following instrument(s): Curette to remove Viable and Non-Viable tissue/material. Material removed includes Eschar and Slough and after achieving pain control using Lidocaine 4% T opical Solution. No specimens were taken. A time out was conducted at 11:14, prior to the start of the procedure. A Minimum amount of bleeding was controlled with Pressure. The procedure was tolerated well with a pain level of 0 throughout and a pain level of 0 following the procedure. Post Debridement Measurements: 0.8cm length x 5.5cm width x 0.1cm depth; 0.346cm^3 volume. Character of Wound/Ulcer Post Debridement is improved. Post procedure Diagnosis Wound #24: Same as Pre-Procedure General Notes: scribed for Dr. Lady Gary by Brenton Grills, RN. Wound #25 Pre-procedure diagnosis of Wound #25 is a Lesion located on the Left Abdomen - Lower Quadrant . There was a Selective/Open Wound Non-Viable Tissue Debridement with a total area of 14.9 sq cm performed by Duanne Guess, MD. With the following instrument(s): Curette to remove Viable and Non-Viable tissue/material. Material removed includes [facility] after achieving pain control using Lidocaine 4% Topical Solution. No specimens were taken. A time out was conducted at 11:14, prior to the start of the procedure. A Minimum amount of bleeding was controlled with Pressure. The procedure was tolerated well with a pain level of 0 throughout and a pain level of 0 following the procedure. Post Debridement Measurements: 7.3cm length x 2.6cm width x 0.1cm depth; 1.491cm^3 volume. Character of Wound/Ulcer Post Debridement is improved. Post procedure Diagnosis Wound  #25: Same as Pre-Procedure General Notes: scribed for Dr. Lady Gary by Brenton Grills, RN. Wound #27 Pre-procedure diagnosis of Wound #27 is a Lesion located on the Left Breast . There was a Selective/Open Wound Non-Viable Tissue Debridement with a total area of 16.48 sq cm performed by Duanne Guess, MD.  With the following instrument(s): Curette to remove Viable and Non-Viable tissue/material. Material removed includes Slough after achieving pain control using Lidocaine 4% Topical Solution. No specimens were taken. A time out was conducted at 11:14, prior to the start of the procedure. A Minimum amount of bleeding was controlled with Pressure. The procedure was tolerated well with a pain level of 0 throughout and a pain level of 0 following the procedure. Post Debridement Measurements: 7cm length x 3cm width x 0.1cm depth; 1.649cm^3 volume. Character of Wound/Ulcer Post Debridement is improved. Post procedure Diagnosis Wound #27: Same as Pre-Procedure General Notes: scribed for Dr. Lady Gary by Brenton Grills, RN. Plan Follow-up Appointments: Return appointment in 1 month. - **** EXTRA TIME at least 1.5 hrs COMPLEX patient**** Dr. Lady Gary Room 3 Other: - Pick up lidocaine at Pharmacy Anesthetic: (In clinic) Topical Lidocaine 5% applied to wound bed Bathing/ Shower/ Hygiene: May shower and wash wound with soap and water. - with dressing changes Edema Control - Lymphedema / SCD / Other: Elevate legs to the level of the heart or above for 30 minutes daily and/or when sitting for 3-4 times a day throughout the day. Avoid standing for long periods of time. Patient to wear own compression stockings every day. Off-Loading: Other: - Try and elevate Left leg throughout the day WOUND #19: - Foot Wound Laterality: Plantar, Left Cleanser: Soap and Water 1 x Per Day/30 Days Discharge Instructions: May shower and wash wound with dial antibacterial soap and water prior to dressing change. Jessie, Sharla [name]  ([id number]) [id number] Page 19 of 22 Cleanser: Wound Cleanser (Generic) 1 x Per Day/30 Days Discharge Instructions: Cleanse the wound with wound cleanser prior to applying a clean dressing using gauze sponges, not tissue or cotton balls. Prim Dressing: Maxorb Extra Ag+ Alginate Dressing, 4x4.75 (in/in) (Generic) 1 x Per Day/30 Days ary Discharge Instructions: Apply to wound bed as instructed Secondary Dressing: ABD Pad, 5x9 (Generic) 1 x Per Day/30 Days Discharge Instructions: Apply over primary dressing as directed. Secondary Dressing: Woven Gauze Sponge, Non-Sterile 4x4 in (Generic) 1 x Per Day/30 Days Discharge Instructions: Apply over primary dressing as directed. Secured With: Elastic Bandage 4 inch (ACE bandage) (Generic) 1 x Per Day/30 Days Discharge Instructions: Secure with ACE bandage as directed. Secured With: [employer], 4.5x3.1 (in/yd) (Generic) 1 x Per Day/30 Days Discharge Instructions: Secure with Kerlix as directed. Secured With: 29M Medipore Scientist, research (life sciences) Surgical T 2x10 (in/yd) (Generic) 1 x Per Day/30 Days ape Discharge Instructions: Secure with tape as directed. WOUND #20: - Amputation Site - Above Knee Wound Laterality: Right Prim Dressing: Maxorb Extra Ag+ Alginate Dressing, 4x4.75 (in/in) 1 x Per Day/30 Days ary Discharge Instructions: Apply to wound bed as instructed Secondary Dressing: ABD Pad, 8x10 1 x Per Day/30 Days Discharge Instructions: Apply over primary dressing as directed. Secured With: 29M Medipore H Soft Cloth Surgical T ape, 4 x 10 (in/yd) 1 x Per Day/30 Days Discharge Instructions: Secure with tape as directed. WOUND #21: - Pubis Wound Laterality: Medial Peri-Wound Care: Zinc Oxide Ointment 30g tube 1 x Per Day/30 Days Discharge Instructions: Apply Zinc Oxide to periwound with each dressing change Secondary Dressing: ABD Pad, 8x10 1 x Per Day/30 Days Discharge Instructions: Apply over primary dressing as  directed. Secured With: 29M Medipore H Soft Cloth Surgical T ape, 4 x 10 (in/yd) 1 x Per Day/30 Days Discharge Instructions: Secure with tape as directed. WOUND #23: - Umbilicus Wound Laterality: Midline Prim Dressing: Maxorb Extra Ag+ Alginate Dressing, 4x4.75 (in/in) 1  x Per Day/30 Days ary Discharge Instructions: Apply to wound bed as instructed Secondary Dressing: ABD Pad, 8x10 1 x Per Day/30 Days Discharge Instructions: Apply over primary dressing as directed. Secured With: 42M Medipore H Soft Cloth Surgical T ape, 4 x 10 (in/yd) 1 x Per Day/30 Days Discharge Instructions: Secure with tape as directed. WOUND #24: - Abdomen - midline Wound Laterality: Topical: Santyl Collagenase Ointment, 30 (gm), tube 1 x Per Day/30 Days Discharge Instructions: Apply to crusty area Prim Dressing: Maxorb Extra Ag+ Alginate Dressing, 4x4.75 (in/in) 1 x Per Day/30 Days ary Discharge Instructions: Apply to wound bed as instructed Secondary Dressing: ABD Pad, 8x10 1 x Per Day/30 Days Discharge Instructions: Apply over primary dressing as directed. Secured With: 42M Medipore H Soft Cloth Surgical T ape, 4 x 10 (in/yd) 1 x Per Day/30 Days Discharge Instructions: Secure with tape as directed. WOUND #27: - Breast Wound Laterality: Left Topical: Santyl Collagenase Ointment, 30 (gm), tube 1 x Per Day/30 Days Discharge Instructions: Apply to the crusty wound on left breast Prim Dressing: Maxorb Extra Ag+ Alginate Dressing, 4x4.75 (in/in) 1 x Per Day/30 Days ary Discharge Instructions: Apply to wound bed as instructed Secondary Dressing: ABD Pad, 8x10 1 x Per Day/30 Days Discharge Instructions: Apply over primary dressing as directed. Secured With: 42M Medipore H Soft Cloth Surgical T ape, 4 x 10 (in/yd) 1 x Per Day/30 Days Discharge Instructions: Secure with tape as directed. 04/26/2023: The right lower quadrant wound has healed. The other wounds are looking a little bit better in terms of their surfaces with  minimal accumulation of slough and eschar. She is currently taking doxycycline prescribed by her PCP for cellulitis in her left leg. I used a curette to debride slough, biofilm, and eschar from her wounds. She has been using topical mupirocin and lidocaine to the wounds on her mons, her umbilicus, and her breast. They have been using silver alginate on the foot, the wound in her intertriginous folds, and her leg. We will continue this course of action. Follow-up in 1 month, per patient request. Electronic Signature(s) Signed: 04/26/2023 11:34:46 AM By: Duanne Guess MD FACS Entered By: Duanne Guess on 04/26/2023 11:34:46 -------------------------------------------------------------------------------- HxROS Details Patient Name: Date of Service: Tubbs, [name] 04/26/2023 10:15 A [name] Medical Record Number: [id number] Patient Account Number: [id number] Date of Birth/Sex: Treating RN: 12-Jun-1961 (62 y.o. F) Primary Care Provider: [name] Other Clinician: Referring Provider: Treating Provider/Extender: Vivien Rossetti in Treatment: 40 [place]um[place]ner, Nelda [name] ([id number]) [id number] Page 20 of 22 Information Obtained From Patient Constitutional Symptoms (General Health) Medical History: Past Medical History Notes: hypothyroidism Eyes Medical History: Positive for: Cataracts - both eyes Negative for: Glaucoma; Optic Neuritis Ear/Nose/Mouth/Throat Medical History: Negative for: Chronic sinus problems/congestion; Middle ear problems Hematologic/Lymphatic Medical History: Positive for: Lymphedema Negative for: Anemia; Hemophilia; Human Immunodeficiency Virus; Sickle Cell Disease Respiratory Medical History: Positive for: Asthma Negative for: Aspiration; Chronic Obstructive Pulmonary Disease (COPD); Pneumothorax; Sleep Apnea; Tuberculosis Cardiovascular Medical History: Positive for: Congestive Heart Failure; Coronary  Artery Disease; Hypertension; Peripheral Arterial Disease; Peripheral Venous Disease Negative for: Angina; Arrhythmia; Hypotension; Myocardial Infarction; Phlebitis; Vasculitis Past Medical History Notes: cardiac stents Gastrointestinal Medical History: Negative for: Hepatitis A; Hepatitis B; Hepatitis C Past Medical History Notes: GERD Endocrine Medical History: Positive for: Type II Diabetes Negative for: Type I Diabetes Past Medical History Notes: Hypothyroidism Time with diabetes: since 2002 Treated with: Insulin, Oral agents Blood sugar tested every day: Yes Tested : 2-3 times a day Genitourinary Medical  History: Negative for: End Stage Renal Disease Immunological Medical History: Negative for: Lupus Erythematosus; Raynauds; Scleroderma 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 Integumentary (Skin) Medical History: Negative for: History of Burn Musculoskeletal Medical History: Positive for: Rheumatoid Arthritis Neubauer, Marivel [name] ([id number]) [id number] Page 21 of 22 Negative for: Gout; Osteoarthritis; Osteomyelitis Past Medical History Notes: Fibromyalgia, Scoliosis, Neurologic Medical History: Positive for: Neuropathy Negative for: Dementia; Quadriplegia; Paraplegia; Seizure Disorder Oncologic Medical History: Negative for: Received Chemotherapy; Received Radiation Psychiatric Medical History: Negative for: Anorexia/bulimia; Confinement Anxiety HBO Extended History Items Eyes: Cataracts Immunizations Pneumococcal Vaccine: Received Pneumococcal Vaccination: Yes Received Pneumococcal Vaccination On or After 60th Birthday: Yes Implantable Devices None Hospitalization / Surgery History Type of Hospitalization/Surgery 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 Family and Social History Cancer: No; Diabetes: Yes - Mother;  Heart Disease: Yes - Mother,Father,Siblings; Hereditary Spherocytosis: No; Hypertension: Yes - Mother,Father,Siblings; Kidney Disease: No; Lung Disease: Yes - Father; Seizures: No; Stroke: Yes - Mother; Thyroid Problems: No; Tuberculosis: No; Former smoker - quit 15 years ago; Marital Status - Married; Alcohol Use: Never; Drug Use: No History; Caffeine Use: Rarely; Financial Concerns: No; Food, Clothing or Shelter Needs: No; Support System Lacking: No; Transportation Concerns: No Electronic Signature(s) Signed: 04/26/2023 11:36:02 AM By: Duanne Guess MD FACS Entered By: Duanne Guess on 04/26/2023 11:32:22 -------------------------------------------------------------------------------- SuperBill Details Patient Name: Date of Service: Dement, [name] 04/26/2023 Medical Record Number: [id number] Patient Account Number: [id number] Date of Birth/Sex: Treating RN: 1961/02/07 (62 y.o. 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 L97.112 Non-pressure chronic ulcer of right thigh with fat layer exposed L98.492 Non-pressure chronic ulcer of skin of other sites with fat layer exposed Oberholzer, Hillarie [name] ([id number]) [id number] Page 22 of 22 [phone] Non-pressure chronic ulcer of other part of left lower leg with fat layer exposed L30.4 Erythema intertrigo M86.9 Osteomyelitis, unspecified E11.621 Type 2 diabetes mellitus with foot ulcer E66.01 Morbid (severe) obesity due to excess calories M35.00 Sjogren syndrome, unspecified I73.9 Peripheral vascular disease, unspecified I50.32 Chronic diastolic (congestive) heart failure 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 Facility Procedures : 7 CPT4 Code:  6440347 Description: 97597 - DEBRIDE WOUND 1ST 20 SQ CM OR < ICD-10 Diagnosis Description 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 L97.112 Non-pressure chronic ulcer of right thigh with fat layer exposed L98.492  Non-pressure chronic ulcer of skin of other sites with fat layer exposed L97.822 Non-pressure chronic ulcer of other part of left lower leg with fat layer exposed Modifier: Quantity: 1 : 7 CPT4 Code: 4259563 Description: 97598 - DEBRIDE WOUND EA ADDL 20 SQ CM ICD-10 Diagnosis Description 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 L97.112 Non-pressure chronic ulcer of right thigh with fat layer exposed L98.492  Non-pressure chronic ulcer of skin of other sites with fat layer exposed L97.822 Non-pressure chronic ulcer of other part of left lower leg with fat layer exposed Modifier: Quantity: 7 Physician Procedures : CPT4 Code Description Modifier [id number] 99214 - WC PHYS LEVEL 4 - EST PT 25 ICD-10 Diagnosis Description 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 L97.112 Non-pressure chronic ulcer of right thigh with fat layer  exposed L98.492 Non-pressure chronic ulcer of skin of other sites with fat layer exposed L97.822 Non-pressure chronic ulcer of other part of left lower leg with fat layer exposed Quantity: 1 : [id number] 97597 - WC PHYS DEBR WO ANESTH 20 SQ CM ICD-10 Diagnosis Description 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 L97.112 Non-pressure chronic ulcer of right thigh with fat layer exposed L98.492  Non-pressure chronic ulcer of skin of other sites with fat layer exposed L97.822 Non-pressure chronic ulcer of other part of left lower leg with fat layer exposed Quantity: 1 : [id number] 97598 - WC PHYS DEBR WO ANESTH EA ADD 20 CM ICD-10 Diagnosis Description L97.423 Non-pressure chronic ulcer of left heel and midfoot with  necrosis of muscle L97.112 Non-pressure chronic ulcer of right thigh with fat layer exposed L98.492  Non-pressure chronic ulcer of skin of other sites with fat layer exposed L97.822 Non-pressure chronic ulcer of other part of left lower leg with fat layer exposed Quantity: 7 Electronic Signature(s) Signed: 04/26/2023 12:34:47 PM By: Duanne Guess MD FACS Signed: 05/08/2023 2:03:55 PM By: Brenton Grills Previous Signature: 04/26/2023 11:35:21 AM Version By: Duanne Guess MD FACS Entered By: Brenton Grills on 04/26/2023 11:58:12

## 2023-05-11 ENCOUNTER — Encounter: Payer: Commercial Managed Care - HMO | Admitting: Physical Medicine and Rehabilitation

## 2023-05-11 VITALS — Ht <= 58 in

## 2023-05-11 DIAGNOSIS — G894 Chronic pain syndrome: Secondary | ICD-10-CM | POA: Diagnosis not present

## 2023-05-11 DIAGNOSIS — M545 Low back pain, unspecified: Secondary | ICD-10-CM | POA: Diagnosis not present

## 2023-05-11 MED ORDER — DICLOFENAC SODIUM 3 % EX GEL: 1.0000 | Freq: Four times a day (QID) | CUTANEOUS | 3 refills | Status: DC | PRN

## 2023-05-11 MED ORDER — GABAPENTIN 400 MG PO CAPS: 400.0000 mg | ORAL_CAPSULE | Freq: Four times a day (QID) | ORAL | 3 refills | Status: DC

## 2023-05-11 MED ORDER — OXYCODONE HCL 20 MG PO TABS: 1.0000 | ORAL_TABLET | Freq: Three times a day (TID) | ORAL | 0 refills | Status: DC | PRN

## 2023-05-11 MED ORDER — PREGABALIN 50 MG PO CAPS: 50.0000 mg | ORAL_CAPSULE | Freq: Three times a day (TID) | ORAL | 0 refills | Status: DC

## 2023-05-11 NOTE — Progress Notes (Signed)
Subjective:    Patient ID: [name], female    DOB: [birth year], 62 y.o.   MRN: [id number]  HPI [name] is a 62 year old woman who presents for hospital follow-up after CIR admission for right AKA.   1) Right AKA -on aspirin -follows with wound care -doing ok with her wheelchair mobility -therapy has been going very well, she feels it is going very well.  -she asks about getting prosthesis -has not been hurting much -healing well, no more drainage -still open -she had trouble getting into hyperbaric chamber -she had done hyperbaric oxygen therapy prior to her amputation  2) Residual limb pain -she has been using the tramadol and oxycodone.  -oxycodone is more effective for her.  -she takes oxycodone 20mg  every 8 hours approximately.  -she uses tylenol -bought a red light therapy and this has been helping with her energy more than her pain -her tens unit has been helpful  3) Confusion: -husband notes that this is worse with the tramadol.   4) Sciatic pain: -pain has returned.  -has been horrific -voltaren gel helps the most   5) Right lower extremity phantom pain: -mild  6) Joint pain: -diffuse, present in elbows, shoulders. -worse with weather -has never tried Infrared light -Uses heating pads and ice -she uses voltaren gels  7) Left foot ulcer: -following with wound care -she is not following Dr. Lajoyce Corners because she did not want an amputation that was recommended.   Pain Inventory Average Pain 7 Pain Right Now 7 My pain is constant, sharp, burning, stabbing, tingling, and aching  In the last 24 hours, has pain interfered with the following? General activity 7 Relation with others 8 Enjoyment of life 8 What TIME of day is your pain at its worst? evening and night Sleep (in general) Poor  Pain is worse with: walking, bending, sitting, inactivity, some activites, and weather Pain improves with: rest, heat/ice, therapy/exercise, pacing activities,  medication, and injections Relief from Meds: 7      Family History  Problem Relation Age of Onset   CAD Mother    Hypertension Mother    Heart attack Mother    Stroke Mother    CAD Father    Heart attack Father    Allergic rhinitis Father    Asthma Father    Hypertension Brother    Hypertension Brother    Pancreatic cancer Paternal Aunt    Breast cancer Paternal Aunt    Lung cancer Paternal Aunt    Allergic rhinitis Son    Asthma Son    Social History   Socioeconomic History   Marital status: Married    Spouse name: [name] Debruyne   Number of children: 1   Years of education: 11   Highest education level: GED or equivalent  Occupational History    Comment: Not currrently working ( last worked 2004)  Tobacco Use   Smoking status: Former    Current packs/day: 0.00    Average packs/day: 1 pack/day for 19.4 years (19.4 ttl pk-yrs)    Types: Cigarettes    Start date: 41    Quit date: 02/11/2004    Years since quitting: 19.2    Passive exposure: Past   Smokeless tobacco: Never  Vaping Use   Vaping status: Never Used  Substance and Sexual Activity   Alcohol use: Yes    Comment: RARE Wine   Drug use: Not Currently    Types: Marijuana    Comment: "only in my  teens"   Sexual activity: Yes    Birth control/protection: Surgical    Comment: Hysterectomy  Other Topics Concern   Not on file  Social History Narrative   Not on file   Social Determinants of Health   Financial Resource Strain: Low Risk  (12/27/2021)   Overall Financial Resource Strain (CARDIA)    Difficulty of Paying Living Expenses: Not hard at all  Food Insecurity: No Food Insecurity (06/14/2022)   Hunger Vital Sign    Worried About Running Out of Food in the Last Year: Never true    Ran Out of Food in the Last Year: Never true  Transportation Needs: No Transportation Needs (06/14/2022)   PRAPARE - Administrator, Civil Service (Medical): No    Lack of Transportation (Non-Medical): No   Physical Activity: Inactive (08/25/2021)   Exercise Vital Sign    Days of Exercise per Week: 0 days    Minutes of Exercise per Session: 0 min  Stress: Stress Concern Present (08/25/2021)   [employer] of Occupational Health - Occupational Stress Questionnaire    Feeling of Stress : Very much  Social Connections: Unknown (01/22/2022)   Received from [facility]   Social Network    Social Network: Not on file   Past Surgical History:  Procedure Laterality Date   ABDOMINAL HYSTERECTOMY  06/2005   "w/right ovariy"   AMPUTATION Right 01/28/2020    RIGHT FOURTH AND FIFTH RAY AMPUTATION   AMPUTATION Right 01/28/2020   Procedure: RIGHT FOURTH AND FIFTH RAY AMPUTATION,;  Surgeon: [name], MD;  Location: [facility] OR;  Service: Orthopedics;  Laterality: Right;   AMPUTATION Right 06/01/2021   Procedure: RIGHT BELOW KNEE AMPUTATION;  Surgeon: [name], MD;  Location: [facility] OR;  Service: Orthopedics;  Laterality: Right;   AMPUTATION Right 07/22/2021   Procedure: RIGHT ABOVE KNEE AMPUTATION;  Surgeon: [name], MD;  Location: [facility] OR;  Service: Orthopedics;  Laterality: Right;   APPENDECTOMY     BREAST BIOPSY Bilateral    9 total (04/18/2018)   CORONARY ANGIOPLASTY WITH STENT PLACEMENT  10/01/2015   normal LM, 30% LAD, 85% mid RCA and 95% distal RCA s/p PCI of the mid to distal RCA and now on DAPT with ASA and Ticagrelor.     CORONARY STENT INTERVENTION Right 04/18/2018   Procedure: CORONARY STENT INTERVENTION;  Surgeon: [name], MD;  Location: [facility] INVASIVE CV LAB;  Service: Cardiovascular;  Laterality: Right;   DILATION AND CURETTAGE OF UTERUS     FRACTURE SURGERY     LAPAROSCOPIC CHOLECYSTECTOMY     LEFT HEART CATH AND CORONARY ANGIOGRAPHY N/A 04/18/2018   Procedure: LEFT HEART CATH AND CORONARY ANGIOGRAPHY;  Surgeon: [name], MD;  Location: [facility] INV[facility] CV LAB;  Service: Cardiovascular;  Laterality: N/A;   MUSCLE BIOPSY Left    "leg"    OOPHORECTOMY  07/2010   RADIOLOGY WITH ANESTHESIA N/A 05/25/2016   Procedure: RADIOLOGY WITH ANESTHESIA;  Surgeon: Medication Radiologist, MD;  Location: [facility] OR;  Service: Radiology;  Laterality: N/A;   STUMP REVISION Right 06/29/2021   Procedure: REVISION RIGHT BELOW KNEE AMPUTATION;  Surgeon: [name], MD;  Location: [facility] OR;  Service: Orthopedics;  Laterality: Right;   TEE WITHOUT CARDIOVERSION N/A 06/01/2021   Procedure: TRANSESOPHAGEAL ECHOCARDIOGRAM (TEE);  Surgeon: Sande Rives, MD;  Location: [facility] OR;  Service: Cardiovascular;  Laterality: N/A;   TONSILLECTOMY AND ADENOIDECTOMY     WRIST FRACTURE SURGERY Left    "  crushed it"   Past Medical History:  Diagnosis Date   Aortic stenosis    mild AS by echo 02/2021   Asthma    "on daily RX and rescue inhaler" (04/18/2018)   Chronic diastolic CHF (congestive heart failure) (HCC) 09/2015   Chronic lower back pain    Chronic neck pain    Chronic pain syndrome    Fentanyl Patch   Colon polyps    Coronary artery disease    cath with normal LM, 30% LAD, 85% mid RCA and 95% distal RCA s/p PCI of the mid to distal RCA and now on DAPT with ASA and Ticagrelor.     Eczema    Excessive daytime sleepiness 11/26/2015   Fibromyalgia    Gallstones    GERD (gastroesophageal reflux disease)    Heart murmur    "noted for the 1st time on 04/18/2018"   History of blood transfusion 07/2010   "S/P oophorectomy"   History of gout    History of hiatal hernia 1980s   "gone now" (04/18/2018)   Hyperlipidemia    Hypertension    takes Metoprolol and Enalapril daily   Hypothyroidism    takes Synthroid daily   IBS (irritable bowel syndrome)    Migraine    "nothing in the 2000s" (04/18/2018)   Mixed connective tissue disease (HCC)    NAFLD (nonalcoholic fatty liver disease)    Pneumonia    "several times" (04/18/2018)   PVC's (premature ventricular contractions)    noted on event monitor 02/2021   Rheumatoid arthritis (HCC)    "hands, elbows,  shoulders, probably knees" (04/18/2018)   Scoliosis    Sjogren's syndrome (HCC)    Sleep apnea    mild - does not use cpap   Spondylosis    Type II diabetes mellitus (HCC)    takes Metformin and Hum R daily (04/18/2018)   Walker as ambulation aid    also uses wheelchair   Ht 1' (0.305 m)   BMI 1425.68 kg/m   Opioid Risk Score:   Fall Risk Score:  `1  Depression screen [facility] 2/9     05/11/2023    9:02 AM 04/12/2023    9:45 AM 01/30/2023   10:04 AM 01/01/2023    9:58 AM 11/28/2022    9:45 AM 08/22/2022   11:21 AM 07/07/2022    9:16 AM  Depression screen PHQ 2/9  Decreased Interest 3 0 1 1 1 1  0  Down, Depressed, Hopeless 1 0 1 1 1 1 1   PHQ - 2 Score 4 0 2 2 2 2 1     Review of Systems  Constitutional: Negative.   HENT: Negative.    Eyes: Negative.   Respiratory:  Positive for shortness of breath and wheezing.   Endocrine: Negative.   Genitourinary: Negative.  Negative for decreased urine volume.  Musculoskeletal:  Positive for back pain and gait problem.       TINGLING, SPASMS  Skin:  Positive for wound.  Allergic/Immunologic: Negative.   Neurological:  Positive for dizziness and tremors.  Hematological: Negative.   Psychiatric/Behavioral:  Positive for sleep disturbance.   All other systems reviewed and are negative.      Objective:   Physical Exam Gen: no distress, normal appearing, weight 291 lbs, 149/79 Gen: no distress, normal appearing HEENT: oral mucosa pink and moist, NCAT Cardio: Reg rate Chest: normal effort, normal rate of breathing Abd: soft, non-distended Ext: no edema Psych: pleasant, normal affect Skin: intact Neuro: Alert and oriented x3 Musculoskeletal:  Right AKA with shrinker    Assessment & Plan:  1) Chronic Diffuse joint pain: -Discussed current symptoms of pain and history of pain.  -prescribed Pennsaid -refilled gabapentin -follow monthly with Riley Lam  -refilled oxycodone 20mg  TID PRN for severe pain .-discussed semaglutide and its  benefits for weight loss and joint pain Prescribed Zynex Nexwave  -Discussed benefits of exercise in reducing pain. -Discussed following foods that may reduce pain: 1) Ginger (especially studied for arthritis)- reduce leukotriene production to decrease inflammation 2) Blueberries- high in phytonutrients that decrease inflammation 3) Salmon- marine omega-3s reduce joint swelling and pain 4) Pumpkin seeds- reduce inflammation 5) dark chocolate- reduces inflammation 6) turmeric- reduces inflammation 7) tart cherries - reduce pain and stiffness 8) extra virgin olive oil - its compound olecanthal helps to block prostaglandins  9) chili peppers- can be eaten or applied topically via capsaicin 10) mint- helpful for headache, muscle aches, joint pain, and itching 11) garlic- reduces inflammation  Link to further information on diet for chronic pain: [email or web address]    2) Diabetes: -check CBGs daily, log, and bring log to follow-up appointment -avoid sugar, bread, pasta, rice -encouraged Dexcom  -avoid snacking -perform daily foot exam and at least annual eye exam -check CBGs daily, log, and bring log to follow-up appointment -avoid sugar, bread, pasta, rice -avoid snacking -perform daily foot exam and at least annual eye exam -try to incorporate into your diet some of the following foods which are good for diabetes: 1) cinnamon- imitates effects of insulin, increasing glucose transport into cells ([place] or [place] cinnamon is best, least processed) 2) nuts- can slow down the blood sugar response of carbohydrate rich foods 3) oatmeal- contains and anti-inflammatory compound avenanthramide 4) whole-milk yogurt (best types are no sugar, [place] yogurt, or goat/sheep yogurt) 5) beans- high in protein, fiber, and vitamins, low glycemic index 6) broccoli- great source of vitamin A and C 7) quinoa- higher in protein  and fiber than other grains 8) spinach- high in vitamin A, fiber, and protein 9) olive oil- reduces glucose levels, LDL, and triglycerides 10) salmon- excellent amount of omega-3-fatty acids 11) walnuts- rich in antioxidants 12) apples- high in fiber and quercetin 13) carrots- highly nutritious with low impact on blood sugar 14) eggs- improve HDL (good cholesterol), high in protein, keep you satiated 15) turmeric: improves blood sugars, cardiovascular disease, and protects kidney health 16) garlic: improves blood sugar, blood pressure, pain 17) tomatoes: highly nutritious with low impact on blood sugar   3) Sciatic pain: -prescribed lidocaine patch -continue lyrica to 50 mg BID -discussed steroid injections  4) Impaired mobility and ADLs Patient was seen for mobility assessment, referring patient to PT eval for power wheelchair  5) Left foot ulcer:  -continue to follow with wound care  6) History of right AKA: -discussed that she used hyperbaric oxygen prior to this  7) Multiple bed sores -continue to follow with wound care  8) Left knee pain: -discussed that this is worst with weight bearing -continue head -continue voltaren gel -discussed Xrs -discussed viscosupplementation for her left knee.  -continue TENS unit -prescribed 3% voltaren gel -discussed that she feels she has a meniscal injury  9) Obesity: -discussed that she cannot weigh herself but she would like to -discussed that she has decreased her eating but likes a little desert.  -current weight reviewed and is 292 lbs -discussed current diet -discussed that she is eating a log of berries, bananas -discussed that she seems to tolerate cereals  -discussed food allergy  testing    >40 minutes spent in review of her chart, current weight, problem list, discussed that chronic pain is improved but still present, prescribed 3% voltaren gel for her left knee pain, discussed that she feels she has a meniscal injury,  discussed lidocaine patch for sciatic, she is using the tens unit for her sciatic pain as well, she checked with her insurance and Reginal Lutes is very expensive for her even with coverage, discussed her current diet, discussed food allergy testing, discussed that due to her history of anaphylaxis she avoids fish, discussed that she weaned herself off Fentanyl patch, discussed that she would like to wean off the oxycodone and the gabapentin

## 2023-05-21 ENCOUNTER — Telehealth: Payer: Self-pay | Admitting: *Deleted

## 2023-05-21 NOTE — Telephone Encounter (Signed)
The rx for Diclofenac gel is onto covered under patient's insurance plan.

## 2023-05-24 ENCOUNTER — Encounter (HOSPITAL_BASED_OUTPATIENT_CLINIC_OR_DEPARTMENT_OTHER): Payer: Commercial Managed Care - HMO | Admitting: General Surgery

## 2023-05-28 ENCOUNTER — Other Ambulatory Visit: Payer: Self-pay | Admitting: Family Medicine

## 2023-06-04 ENCOUNTER — Encounter: Payer: Commercial Managed Care - HMO | Admitting: Registered Nurse

## 2023-06-07 ENCOUNTER — Encounter (HOSPITAL_BASED_OUTPATIENT_CLINIC_OR_DEPARTMENT_OTHER): Payer: Managed Care, Other (non HMO) | Admitting: General Surgery

## 2023-06-13 ENCOUNTER — Encounter: Payer: Self-pay | Admitting: Registered Nurse

## 2023-06-13 ENCOUNTER — Encounter: Payer: Managed Care, Other (non HMO) | Attending: Registered Nurse | Admitting: Registered Nurse

## 2023-06-13 VITALS — BP 110/64 | Temp 96.8°F | Ht 62.4 in | Wt 292.0 lb

## 2023-06-13 DIAGNOSIS — M7062 Trochanteric bursitis, left hip: Secondary | ICD-10-CM | POA: Insufficient documentation

## 2023-06-13 DIAGNOSIS — Z89611 Acquired absence of right leg above knee: Secondary | ICD-10-CM | POA: Insufficient documentation

## 2023-06-13 DIAGNOSIS — M542 Cervicalgia: Secondary | ICD-10-CM | POA: Insufficient documentation

## 2023-06-13 DIAGNOSIS — M25511 Pain in right shoulder: Secondary | ICD-10-CM | POA: Insufficient documentation

## 2023-06-13 DIAGNOSIS — G894 Chronic pain syndrome: Secondary | ICD-10-CM | POA: Diagnosis not present

## 2023-06-13 DIAGNOSIS — M2550 Pain in unspecified joint: Secondary | ICD-10-CM | POA: Diagnosis not present

## 2023-06-13 DIAGNOSIS — M7061 Trochanteric bursitis, right hip: Secondary | ICD-10-CM | POA: Insufficient documentation

## 2023-06-13 DIAGNOSIS — M545 Low back pain, unspecified: Secondary | ICD-10-CM | POA: Insufficient documentation

## 2023-06-13 DIAGNOSIS — M25512 Pain in left shoulder: Secondary | ICD-10-CM | POA: Insufficient documentation

## 2023-06-13 DIAGNOSIS — Z5181 Encounter for therapeutic drug level monitoring: Secondary | ICD-10-CM | POA: Insufficient documentation

## 2023-06-13 DIAGNOSIS — Z79899 Other long term (current) drug therapy: Secondary | ICD-10-CM | POA: Insufficient documentation

## 2023-06-13 DIAGNOSIS — G8929 Other chronic pain: Secondary | ICD-10-CM | POA: Insufficient documentation

## 2023-06-13 MED ORDER — OXYCODONE HCL 20 MG PO TABS: 1.0000 | ORAL_TABLET | Freq: Three times a day (TID) | ORAL | 0 refills | Status: DC | PRN

## 2023-06-13 NOTE — Progress Notes (Signed)
Subjective:    Patient ID: [name], female    DOB: [birth year], 62 y.o.   MRN: [id number]  HPI: [name] is a 62 y.o. female who is scheduled for Virtual Visit, she called office reporting her left lower extremity with  swelling, and she is unable to transfer. She has F/U with her PCP, she verbalizes understanding.   [name] I connected with  [name] by a video enabled telemedicine application and verified that I am speaking with the correct person using two identifiers.  Location: Patient: At her Home Provider: In the Office    I discussed the limitations of evaluation and management by telemedicine and the availability of in person appointments. The patient expressed understanding and agreed to proceed.  She states her pain is located in her bilateral shoulders, lower back pain, left lower extremity and generalized joint pain  She rates her pain 8. She's not following her current exercise regimen at this time..  [name] Morphine equivalent is 90.00 MME.   Last oral swab was performed on 11/28/2022, it was consistent.      Pain Inventory Average Pain 7 Pain Right Now 8 My pain is intermittent, constant, sharp, burning, dull, stabbing, tingling, and aching  In the last 24 hours, has pain interfered with the following? General activity 2 Relation with others 10 Enjoyment of life 10 What TIME of day is your pain at its worst? varies Sleep (in general) Poor  Pain is worse with: bending, sitting, inactivity, standing, and some activites Pain improves with: rest, heat/ice, therapy/exercise, medication, TENS, and injections Relief from Meds: 8  Family History  Problem Relation Age of Onset   CAD Mother    Hypertension Mother    Heart attack Mother    Stroke Mother    CAD Father    Heart attack Father    Allergic rhinitis Father    Asthma Father    Hypertension Brother    Hypertension Brother    Pancreatic cancer Paternal Aunt    Breast  cancer Paternal Aunt    Lung cancer Paternal Aunt    Allergic rhinitis Son    Asthma Son    Social History   Socioeconomic History   Marital status: Married    Spouse name: [name]   Number of children: 1   Years of education: 11   Highest education level: GED or equivalent  Occupational History    Comment: Not currrently working ( last worked 2004)  Tobacco Use   Smoking status: Former    Current packs/day: 0.00    Average packs/day: 1 pack/day for 19.4 years (19.4 ttl pk-yrs)    Types: Cigarettes    Start date: 28    Quit date: 02/11/2004    Years since quitting: 19.3    Passive exposure: Past   Smokeless tobacco: Never  Vaping Use   Vaping status: Never Used  Substance and Sexual Activity   Alcohol use: Yes    Comment: RARE Wine   Drug use: Not Currently    Types: Marijuana    Comment: "only in my teens"   Sexual activity: Yes    Birth control/protection: Surgical    Comment: Hysterectomy  Other Topics Concern   Not on file  Social History Narrative   Not on file   Social Determinants of Health   Financial Resource Strain: Low Risk  (12/27/2021)   Overall Financial Resource Strain (CARDIA)    Difficulty of Paying Living Expenses: Not  hard at all  Food Insecurity: No Food Insecurity (06/14/2022)   Hunger Vital Sign    Worried About Running Out of Food in the Last Year: Never true    Ran Out of Food in the Last Year: Never true  Transportation Needs: No Transportation Needs (06/14/2022)   PRAPARE - Administrator, Civil Service (Medical): No    Lack of Transportation (Non-Medical): No  Physical Activity: Inactive (08/25/2021)   Exercise Vital Sign    Days of Exercise per Week: 0 days    Minutes of Exercise per Session: 0 min  Stress: Stress Concern Present (08/25/2021)   [employer] of Occupational Health - Occupational Stress Questionnaire    Feeling of Stress : Very much  Social Connections: Unknown (01/22/2022)   Received from [facility]   Social Network    Social Network: Not on file   Past Surgical History:  Procedure Laterality Date   ABDOMINAL HYSTERECTOMY  06/2005   "w/right ovariy"   AMPUTATION Right 01/28/2020    RIGHT FOURTH AND FIFTH RAY AMPUTATION   AMPUTATION Right 01/28/2020   Procedure: RIGHT FOURTH AND FIFTH RAY AMPUTATION,;  Surgeon: [name] Mustard, MD;  Location: [facility] OR;  Service: Orthopedics;  Laterality: Right;   AMPUTATION Right 06/01/2021   Procedure: RIGHT BELOW KNEE AMPUTATION;  Surgeon: [name] Mustard, MD;  Location[facility] OR;  Service: Orthopedics;  Laterality: Right;   AMPUTATION Right 07/22/2021   Procedure: RIGHT ABOVE KNEE AMPUTATION;  Surgeon: [name] Mustard, MD;  Location: [facility] OR;  Service: Orthopedics;  Laterality: Right;   APPENDECTOMY     BREAST BIOPSY Bilateral    9 total (04/18/2018)   CORONARY ANGIOPLASTY WITH STENT PLACEMENT  10/01/2015   normal LM, 30% LAD, 85% mid RCA and 95% distal RCA s/p PCI of the mid to distal RCA and now on DAPT with ASA and Ticagrelor.     CORONARY STENT INTERVENTION Right 04/18/2018   Procedure: CORONARY STENT INTERVENTION;  Surgeon: [name] Hazel, MD;  Location: [facility] INVASIVE CV LAB;  Service: Cardiovascular;  Laterality: Right;   DILATION AND CURETTAGE OF UTERUS     FRACTURE SURGERY     LAPAROSCOPIC CHOLECYSTECTOMY     LEFT HEART CATH AND CORONARY ANGIOGRAPHY N/A 04/18/2018   Procedure: LEFT HEART CATH AND CORONARY ANGIOGRAPHY;  Surgeon: [name] Hazel, MD;  Location: [facility];  Service: Cardiovascular;  Laterality: N/A;   MUSCLE BIOPSY Left    "leg"   OOPHORECTOMY  07/2010   RADIOLOGY WITH ANESTHESIA N/A 05/25/2016   Procedure: RADIOLOGY WITH ANESTHESIA;  Surgeon: Medication Radiologist, MD;  Location: [facility] OR;  Service: Radiology;  Laterality: N/A;   STUMP REVISION Right 06/29/2021   Procedure: REVISION RIGHT BELOW KNEE AMPUTATION;  Surgeon: [name] Mustard, MD;  Location: [facility] OR;  Service: Orthopedics;  Laterality:  Right;   TEE WITHOUT CARDIOVERSION N/A 06/01/2021   Procedure: TRANSESOPHAGEAL ECHOCARDIOGRAM (TEE);  Surgeon: Sande Rives, MD;  Location: [facility] OR;  Service: Cardiovascular;  Laterality: N/A;   TONSILLECTOMY AND ADENOIDECTOMY     WRIST FRACTURE SURGERY Left    "crushed it"   Past Surgical History:  Procedure Laterality Date   ABDOMINAL HYSTERECTOMY  06/2005   "w/right ovariy"   AMPUTATION Right 01/28/2020    RIGHT FOURTH AND FIFTH RAY AMPUTATION   AMPUTATION Right 01/28/2020   Procedure: RIGHT FOURTH AND FIFTH RAY AMPUTATION,;  Surgeon: [name] Mustard, MD;  Location: [facility] OR;  Service: Orthopedics;  Laterality: Right;  AMPUTATION Right 06/01/2021   Procedure: RIGHT BELOW KNEE AMPUTATION;  Surgeon: [name] Mustard, MD;  Location: [facility] OR;  Service: Orthopedics;  Laterality: Right;   AMPUTATION Right 07/22/2021   Procedure: RIGHT ABOVE KNEE AMPUTATION;  Surgeon: [name] Mustard, MD;  Location: [facility] OR;  Service: Orthopedics;  Laterality: Right;   APPENDECTOMY     BREAST BIOPSY Bilateral    9 total (04/18/2018)   CORONARY ANGIOPLASTY WITH STENT PLACEMENT  10/01/2015   normal LM, 30% LAD, 85% mid RCA and 95% distal RCA s/p PCI of the mid to distal RCA and now on DAPT with ASA and Ticagrelor.     CORONARY STENT INTERVENTION Right 04/18/2018   Procedure: CORONARY STENT INTERVENTION;  Surgeon: [name] Hazel, MD;  [facility]: [facility];  Service: Cardiovascular;  Laterality: Right;   DILATION AND CURETTAGE OF UTERUS     FRACTURE SURGERY     LAPAROSCOPIC CHOLECYSTECTOMY     LEFT HEART CATH AND CORONARY ANGIOGRAPHY N/A 04/18/2018   Procedure: LEFT HEART CATH AND CORONARY ANGIOGRAPHY;  Surgeon: [name] Hazel, MD;  [facility]: [facility];  Service: Cardiovascular;  Laterality: N/A;   MUSCLE BIOPSY Left    "leg"   OOPHORECTOMY  07/2010   RADIOLOGY WITH ANESTHESIA N/A 05/25/2016   Procedure: RADIOLOGY WITH ANESTHESIA;  Surgeon: Medication Radiologist, MD;  Location: [facility]  OR;  Service: Radiology;  Laterality: N/A;   STUMP REVISION Right 06/29/2021   Procedure: REVISION RIGHT BELOW KNEE AMPUTATION;  Surgeon: [name] Mustard, MD;  Location: [facility] OR;  Service: Orthopedics;  Laterality: Right;   TEE WITHOUT CARDIOVERSION N/A 06/01/2021   Procedure: TRANSESOPHAGEAL ECHOCARDIOGRAM (TEE);  Surgeon: Sande Rives, MD;  Location: [facility] OR;  Service: Cardiovascular;  Laterality: N/A;   TONSILLECTOMY AND ADENOIDECTOMY     WRIST FRACTURE SURGERY Left    "crushed it"   Past Medical History:  Diagnosis Date   Aortic stenosis    mild AS by echo 02/2021   Asthma    "on daily RX and rescue inhaler" (04/18/2018)   Chronic diastolic CHF (congestive heart failure) (HCC) 09/2015   Chronic lower back pain    Chronic neck pain    Chronic pain syndrome    Fentanyl Patch   Colon polyps    Coronary artery disease    cath with normal LM, 30% LAD, 85% mid RCA and 95% distal RCA s/p PCI of the mid to distal RCA and now on DAPT with ASA and Ticagrelor.     Eczema    Excessive daytime sleepiness 11/26/2015   Fibromyalgia    Gallstones    GERD (gastroesophageal reflux disease)    Heart murmur    "noted for the 1st time on 04/18/2018"   History of blood transfusion 07/2010   "S/P oophorectomy"   History of gout    History of hiatal hernia 1980s   "gone now" (04/18/2018)   Hyperlipidemia    Hypertension    takes Metoprolol and Enalapril daily   Hypothyroidism    takes Synthroid daily   IBS (irritable bowel syndrome)    Migraine    "nothing in the 2000s" (04/18/2018)   Mixed connective tissue disease (HCC)    NAFLD (nonalcoholic fatty liver disease)    Pneumonia    "several times" (04/18/2018)   PVC's (premature ventricular contractions)    noted on event monitor 02/2021   Rheumatoid arthritis (HCC)    "hands, elbows, shoulders, probably knees" (04/18/2018)   Scoliosis    Sjogren's syndrome (  HCC)    Sleep apnea    mild - does not use cpap   Spondylosis    Type II diabetes  mellitus (HCC)    takes Metformin and Hum R daily (04/18/2018)   Walker as ambulation aid    also uses wheelchair   There were no vitals taken for this visit.  Opioid Risk Score:   Fall Risk Score:  `1  Depression screen [facility] 2/9     05/11/2023    9:02 AM 04/12/2023    9:45 AM 01/30/2023   10:04 AM 01/01/2023    9:58 AM 11/28/2022    9:45 AM 08/22/2022   11:21 AM 07/07/2022    9:16 AM  Depression screen PHQ 2/9  Decreased Interest 3 0 1 1 1 1  0  Down, Depressed, Hopeless 1 0 1 1 1 1 1   PHQ - 2 Score 4 0 2 2 2 2 1     Review of Systems  Musculoskeletal:  Positive for arthralgias, back pain, gait problem and neck pain.  All other systems reviewed and are negative.     Objective:   Physical Exam Vitals and nursing note reviewed.  Musculoskeletal:     Comments: No Physical Exam Performed: Virtual Visit          Assessment & Plan:  HX: of Right AKA: Dr Lajoyce Corners Following. Continue to Monitor. 06/13/2023 Diabetic Polyneuropathy associated with Type 2 DM: Continue current medication regimen with Lyrica. Continue to Monitor. 06/13/2023 DM Type 2: PCP Following. Continue current medication regimen. Continue to Monitor. 06/13/2023 Chronic  Pain Syndrome : Refilled: Oxycodone 20 mg one tablet every 8 hours as needed for pain #90.  We will continue the opioid monitoring program, this consists of regular clinic visits, examinations, urine drug screen, pill counts as well as use of [place] Controlled Substance Reporting system. A 12 month History has been reviewed on the [place] Controlled Substance Reporting System on 06/13/2023 Stage 1 Decubitus: Left Buttock and Left Lower Extremity: Continue Adequate Nutrition and Weight Shifting.  Wound Care following. Continue to Monitor. 06/13/2023  6. Cellulitis: Ms. Mall was hospitalized on 06/14/2022- 06/20/2022, discharge summary was reviewed.  Continue to Monitpr. PCP Following.06/13/2023 7. Phantom Pain: Continue Lyrica. Continue to  Monitor. 06/13/2023 8. Polyarthralgia: Continue HEP as Tolerated. Continue to Monitor. 06/13/2023 9. Chronic Bilateral Shoulder Pain: Continue HEP as tolerated. Continue to Monitor.06/13/2023  10. Chronic Bilateral Lower Back Pain without Sciatica: Continue Lidocaine Patches 12 hours on and 12 hours off, she verbalizes understanding. Continue HEP as tolerated. Continue current medication regimen. Continue to Monitor. 06/13/2023 11. Greater Trochanter Bursitis:No complaints today.  Continue to Alternate Ice and Heat Therapy. Continue to Monitor. 06/13/2023 12. Left Foot Ulcer: Husband performing dressing changes. Wound Care Following and Dr Lajoyce Corners Following. Continue to Monitor. 06/13/2023  F/U in October \\Virtual  Visit Established Patient Location of Patient : in her Home Location of Provider: In the Office

## 2023-06-14 ENCOUNTER — Ambulatory Visit (HOSPITAL_BASED_OUTPATIENT_CLINIC_OR_DEPARTMENT_OTHER): Payer: Self-pay | Admitting: General Surgery

## 2023-06-25 ENCOUNTER — Encounter (HOSPITAL_BASED_OUTPATIENT_CLINIC_OR_DEPARTMENT_OTHER): Payer: Commercial Managed Care - HMO | Attending: General Surgery | Admitting: General Surgery

## 2023-06-25 DIAGNOSIS — Z6841 Body Mass Index (BMI) 40.0 and over, adult: Secondary | ICD-10-CM | POA: Diagnosis not present

## 2023-06-25 DIAGNOSIS — L97822 Non-pressure chronic ulcer of other part of left lower leg with fat layer exposed: Secondary | ICD-10-CM | POA: Insufficient documentation

## 2023-06-25 DIAGNOSIS — E1169 Type 2 diabetes mellitus with other specified complication: Secondary | ICD-10-CM | POA: Insufficient documentation

## 2023-06-25 DIAGNOSIS — L97112 Non-pressure chronic ulcer of right thigh with fat layer exposed: Secondary | ICD-10-CM | POA: Diagnosis not present

## 2023-06-25 DIAGNOSIS — E11621 Type 2 diabetes mellitus with foot ulcer: Secondary | ICD-10-CM | POA: Insufficient documentation

## 2023-06-25 DIAGNOSIS — M3500 Sicca syndrome, unspecified: Secondary | ICD-10-CM | POA: Diagnosis not present

## 2023-06-25 DIAGNOSIS — L304 Erythema intertrigo: Secondary | ICD-10-CM | POA: Insufficient documentation

## 2023-06-25 DIAGNOSIS — L97423 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: Secondary | ICD-10-CM | POA: Diagnosis present

## 2023-06-25 DIAGNOSIS — I5032 Chronic diastolic (congestive) heart failure: Secondary | ICD-10-CM | POA: Insufficient documentation

## 2023-06-25 DIAGNOSIS — L98492 Non-pressure chronic ulcer of skin of other sites with fat layer exposed: Secondary | ICD-10-CM | POA: Diagnosis not present

## 2023-06-25 DIAGNOSIS — M869 Osteomyelitis, unspecified: Secondary | ICD-10-CM | POA: Diagnosis not present

## 2023-06-25 DIAGNOSIS — Z89611 Acquired absence of right leg above knee: Secondary | ICD-10-CM | POA: Insufficient documentation

## 2023-06-25 DIAGNOSIS — E1151 Type 2 diabetes mellitus with diabetic peripheral angiopathy without gangrene: Secondary | ICD-10-CM | POA: Insufficient documentation

## 2023-06-25 NOTE — Progress Notes (Signed)
Birth/Sex: Treating RN: 10/24/1960 (62 y.o. [name] Primary Care Provider: [name] Other Clinician: Referring Provider: Treating Provider/Extender: Vivien Rossetti in Treatment: 48 Debridement Performed for Assessment: Wound #22 Left,Medial Upper Leg Performed By: Physician Duanne Guess, MD The following information was scribed by: Karie Schwalbe The information was scribed for: Duanne Guess Debridement Type: Debridement Level of Consciousness (Pre-procedure): Awake and Alert Pre-procedure Verification/Time Out Yes - 13:45 Taken: Start Time: 13:45 Pain Control: Lidocaine 4% T opical Solution Percent of Wound Bed Debrided: 100% T Area Debrided (cm): otal 0.94 Tissue and other material debrided: Non-Viable, Slough, Biofilm, Slough Level: Non-Viable Tissue Debridement Description: Selective/Open Wound Instrument: Curette Bleeding: Large Hemostasis Achieved: Pressure End Time: 13:53 Procedural Pain: 0 Post Procedural Pain: 0 Response to  Treatment: Procedure was tolerated well Level of Consciousness (Post- Awake and Alert procedure): Post Debridement Measurements of Total Wound Length: (cm) 2 Stage: Category/Stage II Width: (cm) 0.6 Depth: (cm) 0.1 Volume: (cm) 0.094 Character of Wound/Ulcer Post Debridement: Improved Post Procedure Diagnosis Same as Pre-procedure Electronic Signature(s) Signed: 06/25/2023 4:07:03 PM By: Duanne Guess MD FACS Signed: 06/25/2023 4:30:49 PM By: Karie Schwalbe RN Entered By: Karie Schwalbe on 06/25/2023 14:00:18 -------------------------------------------------------------------------------- Debridement Details Patient Name: Date of Service: Campion, [name] 06/25/2023 1:00 PM Medical Record Number: [id number] Patient Account Number: [id number] Date of Birth/Sex: Treating RN: 1961-02-05 (62 y.o. [name] Primary Care Provider: [name] Other Clinician: Referring Provider: Treating Provider/Extender: Vivien Rossetti in Treatment: 48 Debridement Performed for Assessment: Wound #21 Medial Pubis Performed By: Physician Duanne Guess, MD The following information was scribed by: Karie Schwalbe The information was scribed for: Duanne Guess Debridement Type: Debridement Kopera, Vaishali M ([id number]) [id number] Page 4 of 24 Level of Consciousness (Pre-procedure): Awake and Alert Pre-procedure Verification/Time Out Yes - 13:45 Taken: Start Time: 13:45 Pain Control: Lidocaine 4% T opical Solution Percent of Wound Bed Debrided: 100% T Area Debrided (cm): otal 77.72 Tissue and other material debrided: Non-Viable, Slough, Biofilm, Slough Level: Non-Viable Tissue Debridement Description: Selective/Open Wound Instrument: Curette Bleeding: Large Hemostasis Achieved: Pressure End Time: 13:53 Procedural Pain: 0 Post Procedural Pain: 0 Response to Treatment: Procedure was tolerated well Level of  Consciousness (Post- Awake and Alert procedure): Post Debridement Measurements of Total Wound Length: (cm) 9 Stage: Category/Stage II Width: (cm) 11 Depth: (cm) 0.1 Volume: (cm) 7.775 Character of Wound/Ulcer Post Debridement: Improved Post Procedure Diagnosis Same as Pre-procedure Electronic Signature(s) Signed: 06/25/2023 4:07:03 PM By: Duanne Guess MD FACS Signed: 06/25/2023 4:30:49 PM By: Karie Schwalbe RN Entered By: Karie Schwalbe on 06/25/2023 14:01:10 -------------------------------------------------------------------------------- Debridement Details Patient Name: Date of Service: Villari, [name] 06/25/2023 1:00 PM Medical Record Number: [id number] Patient Account Number: [id number] Date of Birth/Sex: Treating RN: February 15, 1961 (62 y.o. 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 Performed By: Physician Duanne Guess, MD Debridement Type: Debridement Level of Consciousness (Pre-procedure): Awake and Alert Pre-procedure Verification/Time Out Yes - 13:45 Taken: Start Time: 13:45 Pain Control: Lidocaine 4% Topical Solution Percent of Wound Bed Debrided: 100% T Area Debrided (cm): otal 2.67 Tissue and other material debrided: Non-Viable, Eschar, Slough, Slough Level: Non-Viable Tissue Debridement Description: Selective/Open Wound Instrument: Curette Bleeding: Large Hemostasis Achieved: Pressure End Time: 13:53 Procedural Pain: 0 Post Procedural Pain: 0 Response to Treatment: Procedure was tolerated well Level of Consciousness (Post- Awake and Alert procedure):  No; Transportation Concerns: No Electronic Signature(s) Signed: 06/25/2023 4:07:03 PM By: Duanne Guess MD FACS Entered By: Duanne Guess on 06/25/2023 14:30:38 -------------------------------------------------------------------------------- SuperBill Details Patient Name: Date of Service: Joens, [name] 06/25/2023 Medical Record Number: [id number] Patient  Account Number: [id number] Date of Birth/Sex: Treating RN: 1960/12/28 (62 y.o. 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-pressure chronic ulcer of right thigh with fat layer exposed L98.492 Non-pressure chronic ulcer of skin of other sites with fat layer exposed L97.822 Non-pressure chronic ulcer of other part of left lower leg with fat layer exposed L30.4 Erythema intertrigo M86.9 Osteomyelitis, unspecified E11.621 Type 2 diabetes mellitus with foot ulcer E66.01 Morbid (severe) obesity due to excess calories M35.00 Sjogren syndrome, unspecified I73.9 Peripheral vascular disease, unspecified I50.32 Chronic diastolic (congestive) heart failure 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 Facility Procedures : Schnepf, 7 CPT4 CodeMiachel Roux ([id number] 97 I Description: ) [phone] 597 - DEBRIDE WOUND 1ST 20 SQ CM OR < CD-10 Diagnosis Description 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 L97.112 Non-pressure chronic ulcer of right thigh with fat layer exposed  L98.492 Non-pressure chronic ulcer of skin of other sites with fat layer exposed L97.822 Non-pressure chronic ulcer of other part of left lower leg with fat layer expo Modifier: [id number]_Physician_51 sed Quantity: 227.pdf Page 24 of 24 1 : 7 CPT4 Code: [id number] 97 I Description: 598 - DEBRIDE WOUND EA ADDL 20 SQ CM CD-10 Diagnosis Description 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 L97.112 Non-pressure chronic ulcer of right thigh with fat layer exposed L98.492  Non-pressure chronic ulcer of skin of other sites with fat layer exposed L97.822 Non-pressure chronic ulcer of  other part of left lower leg with fat layer expo Modifier: sed Quantity: 7 Physician Procedures : CPT4 Code Description Modifier [id number] - WC PHYS LEVEL 4 - EST PT 25 ICD-10 Diagnosis Description 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 L97.112 Non-pressure chronic ulcer of right thigh with fat layer  exposed L98.492 Non-pressure chronic ulcer of skin of other sites with fat layer exposed L97.822 Non-pressure chronic ulcer of other part of left lower leg with fat layer exposed Quantity: 1 : [id number] 97597 - WC PHYS DEBR WO ANESTH 20 SQ CM ICD-10 Diagnosis Description 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 L97.112 Non-pressure chronic ulcer of right thigh with fat layer exposed L98.492  Non-pressure chronic ulcer of skin of other sites with fat layer exposed L97.822 Non-pressure chronic ulcer of other part of left lower leg with fat layer exposed Quantity: 1 : [id number] 97598 - WC PHYS DEBR WO ANESTH EA ADD 20 CM ICD-10 Diagnosis Description 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 L97.112 Non-pressure chronic ulcer of right thigh with fat layer exposed L98.492  Non-pressure chronic ulcer of skin of other sites with fat layer exposed L97.822 Non-pressure chronic ulcer of other part of left lower leg with fat layer exposed Quantity: 7 Electronic Signature(s) Signed: 06/25/2023 2:52:13 PM By: Duanne Guess MD FACS Entered By: Duanne Guess on 06/25/2023 14:52:13  Post Debridement Measurements of Total Wound Schlemmer, Shilah M ([id number]) [id number] Page 5 of 24 Length: (cm) 2 Stage: Category/Stage II Width:  (cm) 1.7 Depth: (cm) 0.1 Volume: (cm) 0.267 Character of Wound/Ulcer Post Debridement: Improved Post Procedure Diagnosis Same as Pre-procedure Electronic Signature(s) Signed: 06/25/2023 4:07:03 PM By: Duanne Guess MD FACS Signed: 06/25/2023 4:30:49 PM By: Karie Schwalbe RN Entered By: Karie Schwalbe on 06/25/2023 14:02:04 -------------------------------------------------------------------------------- Debridement Details Patient Name: Date of Service: Lesperance, [name] 06/25/2023 1:00 PM Medical Record Number: [id number] Patient Account Number: [id number] Date of Birth/Sex: Treating RN: Feb 24, 1961 (62 y.o. [name] Primary Care Provider: [name] Other Clinician: Referring Provider: Treating Provider/Extender: Vivien Rossetti in Treatment: 48 Debridement Performed for Assessment: Wound #25 Left Abdomen - Lower Quadrant Performed By: Physician Duanne Guess, MD The following information was scribed by: Karie Schwalbe The information was scribed for: Duanne Guess Debridement Type: Debridement Level of Consciousness (Pre-procedure): Awake and Alert Pre-procedure Verification/Time Out Yes - 13:45 Taken: Start Time: 13:45 Pain Control: Lidocaine 4% T opical Solution Percent of Wound Bed Debrided: 100% T Area Debrided (cm): otal 14.84 Tissue and other material debrided: Non-Viable, Slough, Slough Level: Non-Viable Tissue Debridement Description: Selective/Open Wound Instrument: Curette Bleeding: Large Hemostasis Achieved: Pressure End Time: 13:53 Procedural Pain: 0 Post Procedural Pain: 0 Response to Treatment: Procedure was tolerated well Level of Consciousness (Post- Awake and Alert procedure): Post Debridement Measurements of Total Wound Length: (cm) 7 Width: (cm) 2.7 Depth: (cm) 0.1 Volume: (cm) 1.484 Character of Wound/Ulcer Post Debridement: Improved Post Procedure Diagnosis Same as  Pre-procedure Electronic Signature(s) Signed: 06/25/2023 4:07:03 PM By: Duanne Guess MD FACS Signed: 06/25/2023 4:30:49 PM By: Karie Schwalbe RN Entered By: Karie Schwalbe on 06/25/2023 14:03:35 Krinke, Mee M ([id number]) [id number] Page 6 of 24 -------------------------------------------------------------------------------- Debridement Details Patient Name: Date of Service: Kramar, [name] 06/25/2023 1:00 PM Medical Record Number: [id number] Patient Account Number: [id number] Date of Birth/Sex: Treating RN: 15-Mar-1961 (62 y.o. [name] Primary Care Provider: [name] Other Clinician: Referring Provider: Treating Provider/Extender: Vivien Rossetti in Treatment: 48 Debridement Performed for Assessment: Wound #19 Left,Plantar Foot Performed By: Physician Duanne Guess, MD The following information was scribed by: Karie Schwalbe The information was scribed for: Duanne Guess Debridement Type: Debridement Severity of Tissue Pre Debridement: Fat layer exposed Level of Consciousness (Pre-procedure): Awake and Alert Pre-procedure Verification/Time Out Yes - 13:45 Taken: Start Time: 13:45 Pain Control: Lidocaine 4% T opical Solution Percent of Wound Bed Debrided: 100% T Area Debrided (cm): otal 36.69 Tissue and other material debrided: Non-Viable, Slough, Slough Level: Non-Viable Tissue Debridement Description: Selective/Open Wound Instrument: Curette Bleeding: Minimum Hemostasis Achieved: Pressure End Time: 13:53 Procedural Pain: 0 Post Procedural Pain: 0 Response to Treatment: Procedure was tolerated well Level of Consciousness (Post- Awake and Alert procedure): Post Debridement Measurements of Total Wound Length: (cm) 12.3 Width: (cm) 3.8 Depth: (cm) 0.2 Volume: (cm) 7.342 Character of Wound/Ulcer Post Debridement: Improved Severity of Tissue Post Debridement: Fat layer exposed Post  Procedure Diagnosis Same as Pre-procedure Electronic Signature(s) Signed: 06/25/2023 4:07:03 PM By: Duanne Guess MD FACS Signed: 06/25/2023 4:30:49 PM By: Karie Schwalbe RN Entered By: Karie Schwalbe on 06/25/2023 14:07:30 -------------------------------------------------------------------------------- HPI Details Patient Name: Date of Service: Bargar, [name] 06/25/2023 1:00 PM Medical Record Number: [id number] Patient Account Number: [id number] Date of Birth/Sex: Treating RN: 06/05/1961 (62 y.o. F) Primary Care Provider: [name] Other Clinician: Referring Provider: Treating Provider/Extender: Vivien Rossetti in Treatment: 48 History of  Post Debridement Measurements of Total Wound Schlemmer, Shilah M ([id number]) [id number] Page 5 of 24 Length: (cm) 2 Stage: Category/Stage II Width:  (cm) 1.7 Depth: (cm) 0.1 Volume: (cm) 0.267 Character of Wound/Ulcer Post Debridement: Improved Post Procedure Diagnosis Same as Pre-procedure Electronic Signature(s) Signed: 06/25/2023 4:07:03 PM By: Duanne Guess MD FACS Signed: 06/25/2023 4:30:49 PM By: Karie Schwalbe RN Entered By: Karie Schwalbe on 06/25/2023 14:02:04 -------------------------------------------------------------------------------- Debridement Details Patient Name: Date of Service: Lesperance, [name] 06/25/2023 1:00 PM Medical Record Number: [id number] Patient Account Number: [id number] Date of Birth/Sex: Treating RN: Feb 24, 1961 (62 y.o. [name] Primary Care Provider: [name] Other Clinician: Referring Provider: Treating Provider/Extender: Vivien Rossetti in Treatment: 48 Debridement Performed for Assessment: Wound #25 Left Abdomen - Lower Quadrant Performed By: Physician Duanne Guess, MD The following information was scribed by: Karie Schwalbe The information was scribed for: Duanne Guess Debridement Type: Debridement Level of Consciousness (Pre-procedure): Awake and Alert Pre-procedure Verification/Time Out Yes - 13:45 Taken: Start Time: 13:45 Pain Control: Lidocaine 4% T opical Solution Percent of Wound Bed Debrided: 100% T Area Debrided (cm): otal 14.84 Tissue and other material debrided: Non-Viable, Slough, Slough Level: Non-Viable Tissue Debridement Description: Selective/Open Wound Instrument: Curette Bleeding: Large Hemostasis Achieved: Pressure End Time: 13:53 Procedural Pain: 0 Post Procedural Pain: 0 Response to Treatment: Procedure was tolerated well Level of Consciousness (Post- Awake and Alert procedure): Post Debridement Measurements of Total Wound Length: (cm) 7 Width: (cm) 2.7 Depth: (cm) 0.1 Volume: (cm) 1.484 Character of Wound/Ulcer Post Debridement: Improved Post Procedure Diagnosis Same as  Pre-procedure Electronic Signature(s) Signed: 06/25/2023 4:07:03 PM By: Duanne Guess MD FACS Signed: 06/25/2023 4:30:49 PM By: Karie Schwalbe RN Entered By: Karie Schwalbe on 06/25/2023 14:03:35 Krinke, Mee M ([id number]) [id number] Page 6 of 24 -------------------------------------------------------------------------------- Debridement Details Patient Name: Date of Service: Kramar, [name] 06/25/2023 1:00 PM Medical Record Number: [id number] Patient Account Number: [id number] Date of Birth/Sex: Treating RN: 15-Mar-1961 (62 y.o. [name] Primary Care Provider: [name] Other Clinician: Referring Provider: Treating Provider/Extender: Vivien Rossetti in Treatment: 48 Debridement Performed for Assessment: Wound #19 Left,Plantar Foot Performed By: Physician Duanne Guess, MD The following information was scribed by: Karie Schwalbe The information was scribed for: Duanne Guess Debridement Type: Debridement Severity of Tissue Pre Debridement: Fat layer exposed Level of Consciousness (Pre-procedure): Awake and Alert Pre-procedure Verification/Time Out Yes - 13:45 Taken: Start Time: 13:45 Pain Control: Lidocaine 4% T opical Solution Percent of Wound Bed Debrided: 100% T Area Debrided (cm): otal 36.69 Tissue and other material debrided: Non-Viable, Slough, Slough Level: Non-Viable Tissue Debridement Description: Selective/Open Wound Instrument: Curette Bleeding: Minimum Hemostasis Achieved: Pressure End Time: 13:53 Procedural Pain: 0 Post Procedural Pain: 0 Response to Treatment: Procedure was tolerated well Level of Consciousness (Post- Awake and Alert procedure): Post Debridement Measurements of Total Wound Length: (cm) 12.3 Width: (cm) 3.8 Depth: (cm) 0.2 Volume: (cm) 7.342 Character of Wound/Ulcer Post Debridement: Improved Severity of Tissue Post Debridement: Fat layer exposed Post  Procedure Diagnosis Same as Pre-procedure Electronic Signature(s) Signed: 06/25/2023 4:07:03 PM By: Duanne Guess MD FACS Signed: 06/25/2023 4:30:49 PM By: Karie Schwalbe RN Entered By: Karie Schwalbe on 06/25/2023 14:07:30 -------------------------------------------------------------------------------- HPI Details Patient Name: Date of Service: Bargar, [name] 06/25/2023 1:00 PM Medical Record Number: [id number] Patient Account Number: [id number] Date of Birth/Sex: Treating RN: 06/05/1961 (62 y.o. F) Primary Care Provider: [name] Other Clinician: Referring Provider: Treating Provider/Extender: Vivien Rossetti in Treatment: 48 History of  she had a Ruggerio, Viera M ([id number])  [id number] Page 9 of 24 period where there was a lot of drainage that resulted in tissue breakdown. She is clearly having some drainage from her left ankle, as well as there is slough accumulation on a cracked skin surface. She has a new wound on her left breast, just medial to the existing wound. She says she has no idea how it started but that she simply woke up one morning with a wound there and her entire breast reddened. She denies any use of external heat source such as a heating pad. All of the other wounds are about the same with slough accumulation. The new breast wound has some eschar and fibrotic surface. The periumbilical wound also has some eschar accumulation. 04/26/2023: The right lower quadrant wound has healed. The other wounds are looking a little bit better in terms of their surfaces with minimal accumulation of slough and eschar. She is currently taking doxycycline prescribed by her PCP for cellulitis in her left leg. 06/25/2023: In the 45-month interval since she last made it to clinic, she has managed to get a number of her smaller wounds healed. She is down to 7 open sites at this time. There is an open wound on her left breast, midline abdomen, left lower quadrant, the mons pubis, the back of her left leg, her AKA stump, and her left heel and midfoot. Her edema is completely uncontrolled. Electronic Signature(s) Signed: 06/25/2023 2:30:30 PM By: Duanne Guess MD FACS Entered By: Duanne Guess on 06/25/2023 14:30:30 -------------------------------------------------------------------------------- Physical Exam Details Patient Name: Date of Service: Mayweather, [name] 06/25/2023 1:00 PM Medical Record Number: [id number] Patient Account Number: [id number] Date of Birth/Sex: Treating RN: 1961/03/10 (62 y.o. F) Primary Care Provider: [name] Other Clinician: Referring Provider: Treating Provider/Extender: Vivien Rossetti in Treatment: 48 Constitutional . . no acute distress. Respiratory Normal work of breathing on room air. Notes 06/25/2023: She is down to 7 open sites at this time. There is an open wound on her left breast, midline abdomen, left lower quadrant, the mons pubis, the back of her left leg, her AKA stump, and her left heel and midfoot. Her edema is completely uncontrolled. Electronic Signature(s) Signed: 06/25/2023 2:44:03 PM By: Duanne Guess MD FACS Previous Signature: 06/25/2023 2:31:35 PM Version By: Duanne Guess MD FACS Entered By: Duanne Guess on 06/25/2023 14:44:03 -------------------------------------------------------------------------------- Physician Orders Details Patient Name: Date of Service: Vandervelden, [name] 06/25/2023 1:00 PM Medical Record Number: [id number] Patient Account Number: [id number] Date of Birth/Sex: Treating RN: 1960-11-06 (62 y.o. 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 L98.492 Non-pressure chronic ulcer of skin of other sites with fat layer exposed L97.822 Non-pressure chronic ulcer of other part of left lower leg with fat layer exposed Buen[place]entura, Daziyah M ([id number]) [id number] Page 10 of 24 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 - **** EXTRA TIME at least 1.5 hrs COMPLEX patient****Stretcher room  Matusik, [place] M ([id number]) [id number] Page 1 of 24 Visit Report for 06/25/2023 Chief Complaint Document Details Patient Name: Date of Service: Heggs, [name] 06/25/2023 1:00 PM Medical Record Number: [id number] Patient Account Number: [id number] Date of Birth/Sex: Treating RN: [birth year] (62 y.o. F) Primary Care Provider: [name] Other Clinician: Referring Provider: Treating Provider/Extender: Vivien Rossetti in Treatment: 48 Information Obtained from: Patient Chief Complaint 10/17/2021; multiple scattered wounds to the abdomen, posterior left leg, left labia and sacrum 07/19/2022: left foot DFU Electronic Signature(s) Signed: 06/25/2023 2:25:23 PM By: Duanne Guess MD FACS Entered By: Duanne Guess on 06/25/2023 14:25:23 -------------------------------------------------------------------------------- Debridement Details Patient Name: Date of Service: Meleski, [name] 06/25/2023 1:00 PM Medical Record Number: [id number] Patient Account Number: [id number] Date of Birth/Sex: Treating RN: [birth year] (62 y.o. [name] Primary Care Provider: [name] Other Clinician: Referring Provider: Treating Provider/Extender: Vivien Rossetti in Treatment: 48 Debridement Performed for Assessment: Wound #27 Left Breast Performed By: Physician Duanne Guess, MD The following information was scribed by: Karie Schwalbe The information was scribed for: Duanne Guess Debridement Type: Debridement Level of Consciousness (Pre-procedure): Awake and Alert Pre-procedure Verification/Time Out Yes - 13:45 Taken: Start Time: 13:45 Pain Control: Lidocaine 4% T opical Solution Percent of Wound Bed Debrided: 100% T Area Debrided (cm): otal 11.3 Tissue and other material debrided: Non-Viable, Slough, Slough Level: Non-Viable Tissue Debridement Description: Selective/Open  Wound Instrument: Curette Bleeding: Large Hemostasis Achieved: Pressure End Time: 13:53 Procedural Pain: 0 Post Procedural Pain: 0 Response to Treatment: Procedure was tolerated well Level of Consciousness (Post- Awake and Alert procedure): Post Debridement Measurements of Total Wound Length: (cm) 8 Width: (cm) 1.8 Donado, Ksenia M ([id number]) [id number] Page 2 of 24 Depth: (cm) 0.1 Volume: (cm) 1.131 Character of Wound/Ulcer Post Debridement: Improved Post Procedure Diagnosis Same as Pre-procedure Electronic Signature(s) Signed: 06/25/2023 4:07:03 PM By: Duanne Guess MD FACS Signed: 06/25/2023 4:30:49 PM By: Karie Schwalbe RN Entered By: Karie Schwalbe on 06/25/2023 13:54:36 -------------------------------------------------------------------------------- Debridement Details Patient Name: Date of Service: Gunner, [name] 06/25/2023 1:00 PM Medical Record Number: [id number] Patient Account Number: [id number] Date of Birth/Sex: Treating RN: [birth year] (62 y.o. [name] Primary Care Provider: [name] Other Clinician: Referring Provider: Treating Provider/Extender: Vivien Rossetti in Treatment: 48 Debridement Performed for Assessment: Wound #23 Midline Umbilicus Performed By: Physician Duanne Guess, MD The following information was scribed by: Karie Schwalbe The information was scribed for: Duanne Guess Debridement Type: Debridement Level of Consciousness (Pre-procedure): Awake and Alert Pre-procedure Verification/Time Out Yes - 13:45 Taken: Start Time: 13:45 Pain Control: Lidocaine 4% T opical Solution Percent of Wound Bed Debrided: 100% T Area Debrided (cm): otal 7.77 Tissue and other material debrided: Non-Viable, Slough, Slough Level: Non-Viable Tissue Debridement Description: Selective/Open Wound Instrument: Curette Bleeding: Large Hemostasis Achieved: Pressure End Time:  13:53 Procedural Pain: 0 Post Procedural Pain: 0 Response to Treatment: Procedure was tolerated well Level of Consciousness (Post- Awake and Alert procedure): Post Debridement Measurements of Total Wound Length: (cm) 4.5 Width: (cm) 2.2 Depth: (cm) 0.1 Volume: (cm) 0.778 Character of Wound/Ulcer Post Debridement: Improved Post Procedure Diagnosis Same as Pre-procedure Electronic Signature(s) Signed: 06/25/2023 4:07:03 PM By: Duanne Guess MD FACS Signed: 06/25/2023 4:30:49 PM By: Karie Schwalbe RN Entered By: Karie Schwalbe on 06/25/2023 13:57:13 Lolli, Lorane M ([id number]) [id number] Page 3 of 24 -------------------------------------------------------------------------------- Debridement Details Patient Name: Date of Service: Gilkeson, [name] 06/25/2023 1:00 PM Medical Record Number: [id number] Patient Account Number: [id number] Date of  Non-pressure chronic ulcer of skin of other sites with fat layer exposed 12/26/2022 No Yes L97.822 Non-pressure chronic ulcer of other part of left lower leg with fat layer exposed4/16/2024 No Yes L30.4 Erythema intertrigo 12/26/2022 No Yes M86.9 Osteomyelitis, unspecified 07/19/2022 No Yes E11.621 Type 2 diabetes mellitus with foot ulcer 07/19/2022 No Yes E66.01 Morbid (severe) obesity due to excess calories 07/19/2022 No Yes Centrella, Charmagne M ([id number]) [id number] Page 14 of 24 M35.00 Sjogren syndrome, unspecified 07/19/2022 No Yes I73.9 Peripheral vascular disease, unspecified 07/19/2022 No Yes I50.32 Chronic diastolic (congestive) heart failure 07/19/2022 No Yes 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 06/25/2023 1:00 PM Medical Record Number: [id number] Patient Account Number:  [id number] Date of Birth/Sex: Treating RN: [birth year] (62 y.o. F) Primary Care Provider: [name] Other Clinician: Referring Provider: Treating Provider/Extender: Vivien Rossetti in Treatment: 48 Subjective Chief Complaint Information obtained from Patient 10/17/2021; multiple scattered wounds to the abdomen, posterior left leg, left labia and sacrum 07/19/2022: left foot DFU History of Present Illness (HPI) 07/23/2019 on evaluation today patient presents with a myriad of wounds noted at multiple locations over her right heel, right lower extremity, and abdominal region. She also has bilateral lower extremity lymphedema which is quite significant as well. Incidentally she also has congestive heart failure and hypertension. She also is obese. With that being said I think all this is contributing as well to her lower extremity edema which is very much uncontrolled. It seems like its been at least several months since she is worn any compression according to what she tells me. With that being said I am not sure exactly when that would have been. She does have fibrotic changes in the lower extremity secondary to lymphedema worse on the left than the right. She did show me pictures of wound she had on the anterior portion of her shin which was quite significant fortunately that has healed. These issues have been intermittent at this time. Again I think that with appropriate compression therapy she may actually be doing better than what we are seeing at this point but again I am not really sure that she is ever been extremely compliant with that. Fortunately there is no signs of active infection at this time. No fever chills noted. As far as the abdominal ulcer she initially had one wound that she thinks may have been a bug bite. Subsequently she was put a dressing on this and she states that the tape pulled skin off on other locations causing other wounds that have  not healed that is been about 3 months. The wounds on her lower extremities have been intermittent over 3 years. This includes the heel. 11/19; this is a patient that I have not seen previously. She was admitted to our clinic last week with multiple wounds including several superficial circular areas on her abdomen predominantly right upper quadrant. Also 1 in her umbilicus. She has an area on her right lateral malleolus which was new today. She has bilateral lower extremity edema with very significant stasis dermatitis on the left anterior tibial area. She has tightly adherent skin in her lower extremities probably secondary to cutaneous fibrosis in the area. We put her in compression last week. She comes in with the dressings reasonably saturated. She tells  edema is completely uncontrolled. I used a curette to debride slough off of all of her open wounds, along with some eschar on the AKA site. She has been mixing topical lidocaine, gentamicin, and mupirocin into Eucerin and using this as a wound dressing along with silver alginate. I have sent in prescriptions for the gentamicin, mupirocin, and lidocaine. Continue silver alginate. I told her that without her edema being better controlled, the wound on her foot would continue to simply for fluid out of it as the path of least resistance. Due to her challenges in getting here on a weekly basis, along with her other multiple medical appointments, she will follow-up in 1 month. Electronic Signature(s) Signed: 06/27/2023 3:07:10 PM By: Shawn Stall RN, BSN Signed: 06/27/2023 3:20:58 PM By: Duanne Guess MD FACS Previous Signature: 06/25/2023 2:50:11 PM Version By: Duanne Guess MD FACS Entered By: Shawn Stall on 06/27/2023 15:06:15 -------------------------------------------------------------------------------- HxROS Details Patient Name: Date of Service: Fruin, [name] 06/25/2023 1:00 PM Medical Record Number:  [id number] Patient Account Number: [id number] Date of Birth/Sex: Treating RN: 1961/07/12 (62 y.o. F) Primary Care Provider: [name] Other Clinician: Referring Provider: Treating Provider/Extender: Vivien Rossetti in Treatment: 48 Information Obtained From Patient Constitutional Symptoms (General Health) Medical History: Past Medical History Notes: hypothyroidism Eyes Medical History: Positive for: Cataracts - both eyes Negative for: Glaucoma; Optic Neuritis Apperson, Donnella M ([id number]) [id number] Page 22 of 24 Ear/Nose/Mouth/Throat Medical History: Negative for: Chronic sinus problems/congestion; Middle ear problems Hematologic/Lymphatic Medical History: Positive for: Lymphedema Negative for: Anemia; Hemophilia; Human Immunodeficiency Virus; Sickle Cell Disease Respiratory Medical History: Positive for: Asthma Negative for: Aspiration; Chronic Obstructive Pulmonary Disease (COPD); Pneumothorax; Sleep Apnea; Tuberculosis Cardiovascular Medical History: Positive for: Congestive Heart Failure; Coronary Artery Disease; Hypertension; Peripheral Arterial Disease; Peripheral Venous Disease Negative for: Angina; Arrhythmia; Hypotension; Myocardial Infarction; Phlebitis; Vasculitis Past Medical History Notes: cardiac stents Gastrointestinal Medical History: Negative for: Hepatitis A; Hepatitis B; Hepatitis C Past Medical History Notes: GERD Endocrine Medical History: Positive for: Type II Diabetes Negative for: Type I Diabetes Past Medical History Notes: Hypothyroidism Time with diabetes: since 2002 Treated with: Insulin, Oral agents Blood sugar tested every day: Yes Tested : 2-3 times a day Genitourinary Medical History: Negative for: End Stage Renal Disease Immunological Medical History: Negative for: Lupus Erythematosus; Raynauds; Scleroderma 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 Integumentary (Skin) Medical History: Negative for: History of Burn Musculoskeletal Medical History: Positive for: Rheumatoid Arthritis Negative for: Gout; Osteoarthritis; Osteomyelitis Past Medical History Notes: Fibromyalgia, Scoliosis, Neurologic Medical History: Positive for: Neuropathy Negative for: Dementia; Quadriplegia; Paraplegia; Seizure Disorder Oncologic Medical History: Negative for: Received Chemotherapy; Received Radiation Bajaj, Payslee M ([id number]) [id number] Page 23 of 24 Psychiatric Medical History: Negative for: Anorexia/bulimia; Confinement Anxiety HBO Extended History Items Eyes: Cataracts Immunizations Pneumococcal Vaccine: Received Pneumococcal Vaccination: Yes Received Pneumococcal Vaccination On or After 60th Birthday: Yes Implantable Devices None Hospitalization / Surgery History Type of Hospitalization/Surgery 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 Family and Social History Cancer: No; Diabetes: Yes - Mother; Heart Disease: Yes - Mother,Father,Siblings; Hereditary Spherocytosis: No; Hypertension: Yes - Mother,Father,Siblings; Kidney Disease: No; Lung Disease: Yes - Father; Seizures: No; Stroke: Yes - Mother; Thyroid Problems: No; Tuberculosis: No; Former smoker - quit 15 years ago; Marital Status - Married; Alcohol Use: Never; Drug Use: No History; Caffeine Use: Rarely; Financial Concerns: No; Food, Clothing or Shelter Needs: No; Support System Lacking:  May shower and wash wound with dial antibacterial soap and water prior to dressing change. Cleanser: Wound Cleanser (Generic) 1 x Per Day/30 Days Discharge Instructions: Cleanse the wound with wound cleanser prior to applying a clean dressing using gauze sponges, not tissue or cotton balls. Topical: Gentamicin 1 x Per Day/30 Days Discharge Instructions: As directed by physician Topical: Mupirocin Ointment 1 x Per Day/30 Days Discharge Instructions: Apply Mupirocin (Bactroban) as instructed Prim Dressing: Maxorb Extra Ag+ Alginate Dressing, 4x4.75 (in/in) (Generic) 1 x Per Day/30 Days ary Discharge Instructions: Apply to wound bed as instructed Secondary Dressing: ABD Pad, 5x9 (Generic) 1 x Per Day/30 Days Discharge Instructions: Apply over primary dressing as directed. Secondary Dressing: Bordered Gauze, 4x4 in 1 x Per Day/30 Days Discharge Instructions: Apply over primary dressing as directed. Secondary Dressing: Woven Gauze Sponge, Non-Sterile 4x4 in (Generic) 1 x Per Day/30 Days Discharge Instructions: Apply over primary dressing as directed. Secured With: 60M Medipore Scientist, research (life sciences) Surgical T 2x10 (in/yd) (Generic) 1 x Per Day/30 Days ape Discharge Instructions: Secure with tape as directed. Wound #27 - Breast Wound Laterality: Left Cleanser: Soap and Water 1 x Per Day/30 Days Discharge Instructions: May shower and wash wound with dial antibacterial soap and water prior to dressing change. Cleanser: Wound Cleanser (Generic) 1 x Per Day/30 Days Discharge Instructions: Cleanse the wound with wound cleanser prior to applying a clean dressing using  gauze sponges, not tissue or cotton balls. Topical: Gentamicin 1 x Per Day/30 Days Discharge Instructions: As directed by physician Topical: Mupirocin Ointment 1 x Per Day/30 Days Discharge Instructions: Apply Mupirocin (Bactroban) as instructed Prim Dressing: Maxorb Extra Ag+ Alginate Dressing, 4x4.75 (in/in) (Generic) 1 x Per Day/30 Days ary Discharge Instructions: Apply to wound bed as instructed Secondary Dressing: ABD Pad, 5x9 (Generic) 1 x Per Day/30 Days Discharge Instructions: Apply over primary dressing as directed. Secondary Dressing: Bordered Gauze, 4x4 in 1 x Per Day/30 Days Discharge Instructions: Apply over primary dressing as directed. Secondary Dressing: Woven Gauze Sponge, Non-Sterile 4x4 in (Generic) 1 x Per Day/30 Days Discharge Instructions: Apply over primary dressing as directed. Soller, Greydis M ([id number]) [id number] Page 13 of 24 Secured With: 60M Science writer Surgical T 2x10 (in/yd) (Generic) 1 x Per Day/30 Days ape Discharge Instructions: Secure with tape as directed. Patient Medications llergies: peanut, morphine, gabapentin, iodine, Victoza, Pepto-Bismol, fish oil, Fish Containing Products, Crestor, lovastatin, metronidazole, Red Yeast Rice A (Monascus Purpureus), rotigotine, Atrovent, Suprep Bowel Prep Kit, adhesive tape, Betadine, Dial soap, Suprep Bowel Prep Kit Notifications Medication Indication Start End 06/25/2023 gentamicin DOSE topical 0.1 % ointment - use as directed for wound care 06/25/2023 mupirocin DOSE topical 2 % ointment - use as directed for wound care 06/25/2023 lidocaine DOSE topical 5 % ointment - use as directed for dressing changes Electronic Signature(s) Signed: 06/25/2023 4:07:03 PM By: Duanne Guess MD FACS Previous Signature: 06/25/2023 2:47:31 PM Version By: Duanne Guess MD FACS Entered By: Duanne Guess on 06/25/2023  14:48:13 -------------------------------------------------------------------------------- Problem List Details Patient Name: Date of Service: Tidd, [name] 06/25/2023 1:00 PM Medical Record Number: [id number] Patient Account Number: [id number] Date of Birth/Sex: Treating RN: 29-Mar-1961 (62 y.o. 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 06/25/2023 1:00 PM Medical Record Number: [id number] Patient Account Number: [id number] Date of Birth/Sex: Treating RN: [birth year] (62 y.o. F) Primary Care Provider: [name] Other Clinician: Referring Provider: Treating Provider/Extender: Vivien Rossetti in Treatment: 48 Information Obtained from: Patient Chief Complaint 10/17/2021; multiple scattered wounds to the abdomen, posterior left leg, left labia and sacrum 07/19/2022: left foot DFU Electronic Signature(s) Signed: 06/25/2023 2:25:23 PM By: Duanne Guess MD FACS Entered By: Duanne Guess on 06/25/2023 14:25:23 -------------------------------------------------------------------------------- Debridement Details Patient Name: Date of Service: Meleski, [name] 06/25/2023 1:00 PM Medical Record Number: [id number] Patient Account Number: [id number] Date of Birth/Sex: Treating RN: [birth year] (62 y.o. [name] Primary Care Provider: [name] Other Clinician: Referring Provider: Treating Provider/Extender: Vivien Rossetti in Treatment: 48 Debridement Performed for Assessment: Wound #27 Left Breast Performed By: Physician Duanne Guess, MD The following information was scribed by: Karie Schwalbe The information was scribed for: Duanne Guess Debridement Type: Debridement Level of Consciousness (Pre-procedure): Awake and Alert Pre-procedure Verification/Time Out Yes - 13:45 Taken: Start Time: 13:45 Pain Control: Lidocaine 4% T opical Solution Percent of Wound Bed Debrided: 100% T Area Debrided (cm): otal 11.3 Tissue and other material debrided: Non-Viable, Slough, Slough Level: Non-Viable Tissue Debridement Description: Selective/Open  Wound Instrument: Curette Bleeding: Large Hemostasis Achieved: Pressure End Time: 13:53 Procedural Pain: 0 Post Procedural Pain: 0 Response to Treatment: Procedure was tolerated well Level of Consciousness (Post- Awake and Alert procedure): Post Debridement Measurements of Total Wound Length: (cm) 8 Width: (cm) 1.8 Donado, Ksenia M ([id number]) [id number] Page 2 of 24 Depth: (cm) 0.1 Volume: (cm) 1.131 Character of Wound/Ulcer Post Debridement: Improved Post Procedure Diagnosis Same as Pre-procedure Electronic Signature(s) Signed: 06/25/2023 4:07:03 PM By: Duanne Guess MD FACS Signed: 06/25/2023 4:30:49 PM By: Karie Schwalbe RN Entered By: Karie Schwalbe on 06/25/2023 13:54:36 -------------------------------------------------------------------------------- Debridement Details Patient Name: Date of Service: Gunner, [name] 06/25/2023 1:00 PM Medical Record Number: [id number] Patient Account Number: [id number] Date of Birth/Sex: Treating RN: [birth year] (62 y.o. [name] Primary Care Provider: [name] Other Clinician: Referring Provider: Treating Provider/Extender: Vivien Rossetti in Treatment: 48 Debridement Performed for Assessment: Wound #23 Midline Umbilicus Performed By: Physician Duanne Guess, MD The following information was scribed by: Karie Schwalbe The information was scribed for: Duanne Guess Debridement Type: Debridement Level of Consciousness (Pre-procedure): Awake and Alert Pre-procedure Verification/Time Out Yes - 13:45 Taken: Start Time: 13:45 Pain Control: Lidocaine 4% T opical Solution Percent of Wound Bed Debrided: 100% T Area Debrided (cm): otal 7.77 Tissue and other material debrided: Non-Viable, Slough, Slough Level: Non-Viable Tissue Debridement Description: Selective/Open Wound Instrument: Curette Bleeding: Large Hemostasis Achieved: Pressure End Time:  13:53 Procedural Pain: 0 Post Procedural Pain: 0 Response to Treatment: Procedure was tolerated well Level of Consciousness (Post- Awake and Alert procedure): Post Debridement Measurements of Total Wound Length: (cm) 4.5 Width: (cm) 2.2 Depth: (cm) 0.1 Volume: (cm) 0.778 Character of Wound/Ulcer Post Debridement: Improved Post Procedure Diagnosis Same as Pre-procedure Electronic Signature(s) Signed: 06/25/2023 4:07:03 PM By: Duanne Guess MD FACS Signed: 06/25/2023 4:30:49 PM By: Karie Schwalbe RN Entered By: Karie Schwalbe on 06/25/2023 13:57:13 Lolli, Lorane M ([id number]) [id number] Page 3 of 24 -------------------------------------------------------------------------------- Debridement Details Patient Name: Date of Service: Gilkeson, [name] 06/25/2023 1:00 PM Medical Record Number: [id number] Patient Account Number: [id number] Date of  Non-pressure chronic ulcer of skin of other sites with fat layer exposed 12/26/2022 No Yes L97.822 Non-pressure chronic ulcer of other part of left lower leg with fat layer exposed4/16/2024 No Yes L30.4 Erythema intertrigo 12/26/2022 No Yes M86.9 Osteomyelitis, unspecified 07/19/2022 No Yes E11.621 Type 2 diabetes mellitus with foot ulcer 07/19/2022 No Yes E66.01 Morbid (severe) obesity due to excess calories 07/19/2022 No Yes Centrella, Charmagne M ([id number]) [id number] Page 14 of 24 M35.00 Sjogren syndrome, unspecified 07/19/2022 No Yes I73.9 Peripheral vascular disease, unspecified 07/19/2022 No Yes I50.32 Chronic diastolic (congestive) heart failure 07/19/2022 No Yes 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 06/25/2023 1:00 PM Medical Record Number: [id number] Patient Account Number:  [id number] Date of Birth/Sex: Treating RN: [birth year] (62 y.o. F) Primary Care Provider: [name] Other Clinician: Referring Provider: Treating Provider/Extender: Vivien Rossetti in Treatment: 48 Subjective Chief Complaint Information obtained from Patient 10/17/2021; multiple scattered wounds to the abdomen, posterior left leg, left labia and sacrum 07/19/2022: left foot DFU History of Present Illness (HPI) 07/23/2019 on evaluation today patient presents with a myriad of wounds noted at multiple locations over her right heel, right lower extremity, and abdominal region. She also has bilateral lower extremity lymphedema which is quite significant as well. Incidentally she also has congestive heart failure and hypertension. She also is obese. With that being said I think all this is contributing as well to her lower extremity edema which is very much uncontrolled. It seems like its been at least several months since she is worn any compression according to what she tells me. With that being said I am not sure exactly when that would have been. She does have fibrotic changes in the lower extremity secondary to lymphedema worse on the left than the right. She did show me pictures of wound she had on the anterior portion of her shin which was quite significant fortunately that has healed. These issues have been intermittent at this time. Again I think that with appropriate compression therapy she may actually be doing better than what we are seeing at this point but again I am not really sure that she is ever been extremely compliant with that. Fortunately there is no signs of active infection at this time. No fever chills noted. As far as the abdominal ulcer she initially had one wound that she thinks may have been a bug bite. Subsequently she was put a dressing on this and she states that the tape pulled skin off on other locations causing other wounds that have  not healed that is been about 3 months. The wounds on her lower extremities have been intermittent over 3 years. This includes the heel. 11/19; this is a patient that I have not seen previously. She was admitted to our clinic last week with multiple wounds including several superficial circular areas on her abdomen predominantly right upper quadrant. Also 1 in her umbilicus. She has an area on her right lateral malleolus which was new today. She has bilateral lower extremity edema with very significant stasis dermatitis on the left anterior tibial area. She has tightly adherent skin in her lower extremities probably secondary to cutaneous fibrosis in the area. We put her in compression last week. She comes in with the dressings reasonably saturated. She tells  edema is completely uncontrolled. I used a curette to debride slough off of all of her open wounds, along with some eschar on the AKA site. She has been mixing topical lidocaine, gentamicin, and mupirocin into Eucerin and using this as a wound dressing along with silver alginate. I have sent in prescriptions for the gentamicin, mupirocin, and lidocaine. Continue silver alginate. I told her that without her edema being better controlled, the wound on her foot would continue to simply for fluid out of it as the path of least resistance. Due to her challenges in getting here on a weekly basis, along with her other multiple medical appointments, she will follow-up in 1 month. Electronic Signature(s) Signed: 06/27/2023 3:07:10 PM By: Shawn Stall RN, BSN Signed: 06/27/2023 3:20:58 PM By: Duanne Guess MD FACS Previous Signature: 06/25/2023 2:50:11 PM Version By: Duanne Guess MD FACS Entered By: Shawn Stall on 06/27/2023 15:06:15 -------------------------------------------------------------------------------- HxROS Details Patient Name: Date of Service: Fruin, [name] 06/25/2023 1:00 PM Medical Record Number:  [id number] Patient Account Number: [id number] Date of Birth/Sex: Treating RN: 1961/07/12 (62 y.o. F) Primary Care Provider: [name] Other Clinician: Referring Provider: Treating Provider/Extender: Vivien Rossetti in Treatment: 48 Information Obtained From Patient Constitutional Symptoms (General Health) Medical History: Past Medical History Notes: hypothyroidism Eyes Medical History: Positive for: Cataracts - both eyes Negative for: Glaucoma; Optic Neuritis Apperson, Donnella M ([id number]) [id number] Page 22 of 24 Ear/Nose/Mouth/Throat Medical History: Negative for: Chronic sinus problems/congestion; Middle ear problems Hematologic/Lymphatic Medical History: Positive for: Lymphedema Negative for: Anemia; Hemophilia; Human Immunodeficiency Virus; Sickle Cell Disease Respiratory Medical History: Positive for: Asthma Negative for: Aspiration; Chronic Obstructive Pulmonary Disease (COPD); Pneumothorax; Sleep Apnea; Tuberculosis Cardiovascular Medical History: Positive for: Congestive Heart Failure; Coronary Artery Disease; Hypertension; Peripheral Arterial Disease; Peripheral Venous Disease Negative for: Angina; Arrhythmia; Hypotension; Myocardial Infarction; Phlebitis; Vasculitis Past Medical History Notes: cardiac stents Gastrointestinal Medical History: Negative for: Hepatitis A; Hepatitis B; Hepatitis C Past Medical History Notes: GERD Endocrine Medical History: Positive for: Type II Diabetes Negative for: Type I Diabetes Past Medical History Notes: Hypothyroidism Time with diabetes: since 2002 Treated with: Insulin, Oral agents Blood sugar tested every day: Yes Tested : 2-3 times a day Genitourinary Medical History: Negative for: End Stage Renal Disease Immunological Medical History: Negative for: Lupus Erythematosus; Raynauds; Scleroderma 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 Integumentary (Skin) Medical History: Negative for: History of Burn Musculoskeletal Medical History: Positive for: Rheumatoid Arthritis Negative for: Gout; Osteoarthritis; Osteomyelitis Past Medical History Notes: Fibromyalgia, Scoliosis, Neurologic Medical History: Positive for: Neuropathy Negative for: Dementia; Quadriplegia; Paraplegia; Seizure Disorder Oncologic Medical History: Negative for: Received Chemotherapy; Received Radiation Bajaj, Payslee M ([id number]) [id number] Page 23 of 24 Psychiatric Medical History: Negative for: Anorexia/bulimia; Confinement Anxiety HBO Extended History Items Eyes: Cataracts Immunizations Pneumococcal Vaccine: Received Pneumococcal Vaccination: Yes Received Pneumococcal Vaccination On or After 60th Birthday: Yes Implantable Devices None Hospitalization / Surgery History Type of Hospitalization/Surgery 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 Family and Social History Cancer: No; Diabetes: Yes - Mother; Heart Disease: Yes - Mother,Father,Siblings; Hereditary Spherocytosis: No; Hypertension: Yes - Mother,Father,Siblings; Kidney Disease: No; Lung Disease: Yes - Father; Seizures: No; Stroke: Yes - Mother; Thyroid Problems: No; Tuberculosis: No; Former smoker - quit 15 years ago; Marital Status - Married; Alcohol Use: Never; Drug Use: No History; Caffeine Use: Rarely; Financial Concerns: No; Food, Clothing or Shelter Needs: No; Support System Lacking:  May shower and wash wound with dial antibacterial soap and water prior to dressing change. Cleanser: Wound Cleanser (Generic) 1 x Per Day/30 Days Discharge Instructions: Cleanse the wound with wound cleanser prior to applying a clean dressing using gauze sponges, not tissue or cotton balls. Topical: Gentamicin 1 x Per Day/30 Days Discharge Instructions: As directed by physician Topical: Mupirocin Ointment 1 x Per Day/30 Days Discharge Instructions: Apply Mupirocin (Bactroban) as instructed Prim Dressing: Maxorb Extra Ag+ Alginate Dressing, 4x4.75 (in/in) (Generic) 1 x Per Day/30 Days ary Discharge Instructions: Apply to wound bed as instructed Secondary Dressing: ABD Pad, 5x9 (Generic) 1 x Per Day/30 Days Discharge Instructions: Apply over primary dressing as directed. Secondary Dressing: Bordered Gauze, 4x4 in 1 x Per Day/30 Days Discharge Instructions: Apply over primary dressing as directed. Secondary Dressing: Woven Gauze Sponge, Non-Sterile 4x4 in (Generic) 1 x Per Day/30 Days Discharge Instructions: Apply over primary dressing as directed. Secured With: 60M Medipore Scientist, research (life sciences) Surgical T 2x10 (in/yd) (Generic) 1 x Per Day/30 Days ape Discharge Instructions: Secure with tape as directed. Wound #27 - Breast Wound Laterality: Left Cleanser: Soap and Water 1 x Per Day/30 Days Discharge Instructions: May shower and wash wound with dial antibacterial soap and water prior to dressing change. Cleanser: Wound Cleanser (Generic) 1 x Per Day/30 Days Discharge Instructions: Cleanse the wound with wound cleanser prior to applying a clean dressing using  gauze sponges, not tissue or cotton balls. Topical: Gentamicin 1 x Per Day/30 Days Discharge Instructions: As directed by physician Topical: Mupirocin Ointment 1 x Per Day/30 Days Discharge Instructions: Apply Mupirocin (Bactroban) as instructed Prim Dressing: Maxorb Extra Ag+ Alginate Dressing, 4x4.75 (in/in) (Generic) 1 x Per Day/30 Days ary Discharge Instructions: Apply to wound bed as instructed Secondary Dressing: ABD Pad, 5x9 (Generic) 1 x Per Day/30 Days Discharge Instructions: Apply over primary dressing as directed. Secondary Dressing: Bordered Gauze, 4x4 in 1 x Per Day/30 Days Discharge Instructions: Apply over primary dressing as directed. Secondary Dressing: Woven Gauze Sponge, Non-Sterile 4x4 in (Generic) 1 x Per Day/30 Days Discharge Instructions: Apply over primary dressing as directed. Soller, Greydis M ([id number]) [id number] Page 13 of 24 Secured With: 60M Science writer Surgical T 2x10 (in/yd) (Generic) 1 x Per Day/30 Days ape Discharge Instructions: Secure with tape as directed. Patient Medications llergies: peanut, morphine, gabapentin, iodine, Victoza, Pepto-Bismol, fish oil, Fish Containing Products, Crestor, lovastatin, metronidazole, Red Yeast Rice A (Monascus Purpureus), rotigotine, Atrovent, Suprep Bowel Prep Kit, adhesive tape, Betadine, Dial soap, Suprep Bowel Prep Kit Notifications Medication Indication Start End 06/25/2023 gentamicin DOSE topical 0.1 % ointment - use as directed for wound care 06/25/2023 mupirocin DOSE topical 2 % ointment - use as directed for wound care 06/25/2023 lidocaine DOSE topical 5 % ointment - use as directed for dressing changes Electronic Signature(s) Signed: 06/25/2023 4:07:03 PM By: Duanne Guess MD FACS Previous Signature: 06/25/2023 2:47:31 PM Version By: Duanne Guess MD FACS Entered By: Duanne Guess on 06/25/2023  14:48:13 -------------------------------------------------------------------------------- Problem List Details Patient Name: Date of Service: Tidd, [name] 06/25/2023 1:00 PM Medical Record Number: [id number] Patient Account Number: [id number] Date of Birth/Sex: Treating RN: 29-Mar-1961 (62 y.o. 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 She is clearly having some drainage from her left ankle, as well as there is slough accumulation on a cracked skin surface. She has a new wound on her left breast, just medial to the existing wound. She says she has no idea how it started but that she simply woke up one morning with a wound there and her entire breast reddened. She denies any use of external heat source such as a heating pad. All of the other wounds are about the same with slough accumulation. The new breast wound has some eschar and fibrotic surface. The periumbilical wound also has some eschar accumulation. 04/26/2023: The right lower quadrant wound has healed. The other wounds are looking a little bit better in terms of their surfaces with minimal accumulation of slough and eschar. She is currently taking doxycycline prescribed by her PCP for cellulitis in her left leg. 06/25/2023: In the 45-month interval since she last made it to clinic, she has managed to get a number of her smaller wounds healed. She is down to 7 open sites at this time. There is an open wound on her left breast, midline abdomen, left lower quadrant, the mons pubis, the back of her left leg, her AKA stump, and her left heel and midfoot. Her edema is completely uncontrolled. Electronic Signature(s) Signed: 06/25/2023 2:30:30 PM By: Duanne Guess MD FACS Entered By: Duanne Guess on 06/25/2023 14:30:30 -------------------------------------------------------------------------------- Physical Exam Details Patient Name: Date of Service: Mayweather, [name] 06/25/2023 1:00 PM Medical Record Number: [id number] Patient Account Number: [id number] Date of Birth/Sex: Treating RN: 1961/03/10 (62 y.o. F) Primary Care Provider: [name] Other Clinician: Referring Provider: Treating Provider/Extender: Vivien Rossetti in Treatment: 48 Constitutional . . no acute distress. Respiratory Normal work of breathing on room air. Notes 06/25/2023: She is down to 7 open sites at this time. There is an open wound on her left breast, midline abdomen, left lower quadrant, the mons pubis, the back of her left leg, her AKA stump, and her left heel and midfoot. Her edema is completely uncontrolled. Electronic Signature(s) Signed: 06/25/2023 2:44:03 PM By: Duanne Guess MD FACS Previous Signature: 06/25/2023 2:31:35 PM Version By: Duanne Guess MD FACS Entered By: Duanne Guess on 06/25/2023 14:44:03 -------------------------------------------------------------------------------- Physician Orders Details Patient Name: Date of Service: Vandervelden, [name] 06/25/2023 1:00 PM Medical Record Number: [id number] Patient Account Number: [id number] Date of Birth/Sex: Treating RN: 1960-11-06 (62 y.o. 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 L98.492 Non-pressure chronic ulcer of skin of other sites with fat layer exposed L97.822 Non-pressure chronic ulcer of other part of left lower leg with fat layer exposed Buenaventura, Daziyah M ([id number]) [id number] Page 10 of 24 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 - **** EXTRA TIME at least 1.5 hrs COMPLEX patient****Stretcher room  Birth/Sex: Treating RN: 10/24/1960 (62 y.o. [name] Primary Care Provider: [name] Other Clinician: Referring Provider: Treating Provider/Extender: Vivien Rossetti in Treatment: 48 Debridement Performed for Assessment: Wound #22 Left,Medial Upper Leg Performed By: Physician Duanne Guess, MD The following information was scribed by: Karie Schwalbe The information was scribed for: Duanne Guess Debridement Type: Debridement Level of Consciousness (Pre-procedure): Awake and Alert Pre-procedure Verification/Time Out Yes - 13:45 Taken: Start Time: 13:45 Pain Control: Lidocaine 4% T opical Solution Percent of Wound Bed Debrided: 100% T Area Debrided (cm): otal 0.94 Tissue and other material debrided: Non-Viable, Slough, Biofilm, Slough Level: Non-Viable Tissue Debridement Description: Selective/Open Wound Instrument: Curette Bleeding: Large Hemostasis Achieved: Pressure End Time: 13:53 Procedural Pain: 0 Post Procedural Pain: 0 Response to  Treatment: Procedure was tolerated well Level of Consciousness (Post- Awake and Alert procedure): Post Debridement Measurements of Total Wound Length: (cm) 2 Stage: Category/Stage II Width: (cm) 0.6 Depth: (cm) 0.1 Volume: (cm) 0.094 Character of Wound/Ulcer Post Debridement: Improved Post Procedure Diagnosis Same as Pre-procedure Electronic Signature(s) Signed: 06/25/2023 4:07:03 PM By: Duanne Guess MD FACS Signed: 06/25/2023 4:30:49 PM By: Karie Schwalbe RN Entered By: Karie Schwalbe on 06/25/2023 14:00:18 -------------------------------------------------------------------------------- Debridement Details Patient Name: Date of Service: Campion, [name] 06/25/2023 1:00 PM Medical Record Number: [id number] Patient Account Number: [id number] Date of Birth/Sex: Treating RN: 1961-02-05 (62 y.o. [name] Primary Care Provider: [name] Other Clinician: Referring Provider: Treating Provider/Extender: Vivien Rossetti in Treatment: 48 Debridement Performed for Assessment: Wound #21 Medial Pubis Performed By: Physician Duanne Guess, MD The following information was scribed by: Karie Schwalbe The information was scribed for: Duanne Guess Debridement Type: Debridement Kopera, Vaishali M ([id number]) [id number] Page 4 of 24 Level of Consciousness (Pre-procedure): Awake and Alert Pre-procedure Verification/Time Out Yes - 13:45 Taken: Start Time: 13:45 Pain Control: Lidocaine 4% T opical Solution Percent of Wound Bed Debrided: 100% T Area Debrided (cm): otal 77.72 Tissue and other material debrided: Non-Viable, Slough, Biofilm, Slough Level: Non-Viable Tissue Debridement Description: Selective/Open Wound Instrument: Curette Bleeding: Large Hemostasis Achieved: Pressure End Time: 13:53 Procedural Pain: 0 Post Procedural Pain: 0 Response to Treatment: Procedure was tolerated well Level of  Consciousness (Post- Awake and Alert procedure): Post Debridement Measurements of Total Wound Length: (cm) 9 Stage: Category/Stage II Width: (cm) 11 Depth: (cm) 0.1 Volume: (cm) 7.775 Character of Wound/Ulcer Post Debridement: Improved Post Procedure Diagnosis Same as Pre-procedure Electronic Signature(s) Signed: 06/25/2023 4:07:03 PM By: Duanne Guess MD FACS Signed: 06/25/2023 4:30:49 PM By: Karie Schwalbe RN Entered By: Karie Schwalbe on 06/25/2023 14:01:10 -------------------------------------------------------------------------------- Debridement Details Patient Name: Date of Service: Villari, [name] 06/25/2023 1:00 PM Medical Record Number: [id number] Patient Account Number: [id number] Date of Birth/Sex: Treating RN: February 15, 1961 (62 y.o. 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 Performed By: Physician Duanne Guess, MD Debridement Type: Debridement Level of Consciousness (Pre-procedure): Awake and Alert Pre-procedure Verification/Time Out Yes - 13:45 Taken: Start Time: 13:45 Pain Control: Lidocaine 4% Topical Solution Percent of Wound Bed Debrided: 100% T Area Debrided (cm): otal 2.67 Tissue and other material debrided: Non-Viable, Eschar, Slough, Slough Level: Non-Viable Tissue Debridement Description: Selective/Open Wound Instrument: Curette Bleeding: Large Hemostasis Achieved: Pressure End Time: 13:53 Procedural Pain: 0 Post Procedural Pain: 0 Response to Treatment: Procedure was tolerated well Level of Consciousness (Post- Awake and Alert procedure):  May shower and wash wound with dial antibacterial soap and water prior to dressing change. Cleanser: Wound Cleanser (Generic) 1 x Per Day/30 Days Discharge Instructions: Cleanse the wound with wound cleanser prior to applying a clean dressing using gauze sponges, not tissue or cotton balls. Topical: Gentamicin 1 x Per Day/30 Days Discharge Instructions: As directed by physician Topical: Mupirocin Ointment 1 x Per Day/30 Days Discharge Instructions: Apply Mupirocin (Bactroban) as instructed Prim Dressing: Maxorb Extra Ag+ Alginate Dressing, 4x4.75 (in/in) (Generic) 1 x Per Day/30 Days ary Discharge Instructions: Apply to wound bed as instructed Secondary Dressing: ABD Pad, 5x9 (Generic) 1 x Per Day/30 Days Discharge Instructions: Apply over primary dressing as directed. Secondary Dressing: Bordered Gauze, 4x4 in 1 x Per Day/30 Days Discharge Instructions: Apply over primary dressing as directed. Secondary Dressing: Woven Gauze Sponge, Non-Sterile 4x4 in (Generic) 1 x Per Day/30 Days Discharge Instructions: Apply over primary dressing as directed. Secured With: 60M Medipore Scientist, research (life sciences) Surgical T 2x10 (in/yd) (Generic) 1 x Per Day/30 Days ape Discharge Instructions: Secure with tape as directed. Wound #27 - Breast Wound Laterality: Left Cleanser: Soap and Water 1 x Per Day/30 Days Discharge Instructions: May shower and wash wound with dial antibacterial soap and water prior to dressing change. Cleanser: Wound Cleanser (Generic) 1 x Per Day/30 Days Discharge Instructions: Cleanse the wound with wound cleanser prior to applying a clean dressing using  gauze sponges, not tissue or cotton balls. Topical: Gentamicin 1 x Per Day/30 Days Discharge Instructions: As directed by physician Topical: Mupirocin Ointment 1 x Per Day/30 Days Discharge Instructions: Apply Mupirocin (Bactroban) as instructed Prim Dressing: Maxorb Extra Ag+ Alginate Dressing, 4x4.75 (in/in) (Generic) 1 x Per Day/30 Days ary Discharge Instructions: Apply to wound bed as instructed Secondary Dressing: ABD Pad, 5x9 (Generic) 1 x Per Day/30 Days Discharge Instructions: Apply over primary dressing as directed. Secondary Dressing: Bordered Gauze, 4x4 in 1 x Per Day/30 Days Discharge Instructions: Apply over primary dressing as directed. Secondary Dressing: Woven Gauze Sponge, Non-Sterile 4x4 in (Generic) 1 x Per Day/30 Days Discharge Instructions: Apply over primary dressing as directed. Soller, Greydis M ([id number]) [id number] Page 13 of 24 Secured With: 60M Science writer Surgical T 2x10 (in/yd) (Generic) 1 x Per Day/30 Days ape Discharge Instructions: Secure with tape as directed. Patient Medications llergies: peanut, morphine, gabapentin, iodine, Victoza, Pepto-Bismol, fish oil, Fish Containing Products, Crestor, lovastatin, metronidazole, Red Yeast Rice A (Monascus Purpureus), rotigotine, Atrovent, Suprep Bowel Prep Kit, adhesive tape, Betadine, Dial soap, Suprep Bowel Prep Kit Notifications Medication Indication Start End 06/25/2023 gentamicin DOSE topical 0.1 % ointment - use as directed for wound care 06/25/2023 mupirocin DOSE topical 2 % ointment - use as directed for wound care 06/25/2023 lidocaine DOSE topical 5 % ointment - use as directed for dressing changes Electronic Signature(s) Signed: 06/25/2023 4:07:03 PM By: Duanne Guess MD FACS Previous Signature: 06/25/2023 2:47:31 PM Version By: Duanne Guess MD FACS Entered By: Duanne Guess on 06/25/2023  14:48:13 -------------------------------------------------------------------------------- Problem List Details Patient Name: Date of Service: Tidd, [name] 06/25/2023 1:00 PM Medical Record Number: [id number] Patient Account Number: [id number] Date of Birth/Sex: Treating RN: 29-Mar-1961 (62 y.o. 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 I used a curette to debride slough off of all of her open wounds, along with some eschar on the AKA site. She has been mixing topical lidocaine, gentamicin, and mupirocin into Eucerin and using this as a wound dressing along with silver alginate. I have sent in prescriptions for the gentamicin, mupirocin, and lidocaine. Continue silver alginate. I told her that without her edema being better controlled, the wound on her foot would continue to simply for fluid out of it as the path of least resistance. Due to her challenges in getting here on a weekly basis, along with her other multiple medical appointments, she will follow-up in 1 month. Electronic Signature(s) Signed: 06/27/2023 3:07:10 PM By: Shawn Stall RN, BSN Signed: 06/27/2023 3:20:58 PM By: Duanne Guess MD FACS Previous Signature: 06/25/2023 2:50:11 PM Version By: Duanne Guess MD FACS Entered By: Shawn Stall on 06/27/2023 15:06:15 -------------------------------------------------------------------------------- HxROS Details Patient Name: Date of Service: Fruin, [name] 06/25/2023 1:00 PM Medical Record Number:  [id number] Patient Account Number: [id number] Date of Birth/Sex: Treating RN: 1961/07/12 (62 y.o. F) Primary Care Provider: [name] Other Clinician: Referring Provider: Treating Provider/Extender: Vivien Rossetti in Treatment: 48 Information Obtained From Patient Constitutional Symptoms (General Health) Medical History: Past Medical History Notes: hypothyroidism Eyes Medical History: Positive for: Cataracts - both eyes Negative for: Glaucoma; Optic Neuritis Apperson, Donnella M ([id number]) [id number] Page 22 of 24 Ear/Nose/Mouth/Throat Medical History: Negative for: Chronic sinus problems/congestion; Middle ear problems Hematologic/Lymphatic Medical History: Positive for: Lymphedema Negative for: Anemia; Hemophilia; Human Immunodeficiency Virus; Sickle Cell Disease Respiratory Medical History: Positive for: Asthma Negative for: Aspiration; Chronic Obstructive Pulmonary Disease (COPD); Pneumothorax; Sleep Apnea; Tuberculosis Cardiovascular Medical History: Positive for: Congestive Heart Failure; Coronary Artery Disease; Hypertension; Peripheral Arterial Disease; Peripheral Venous Disease Negative for: Angina; Arrhythmia; Hypotension; Myocardial Infarction; Phlebitis; Vasculitis Past Medical History Notes: cardiac stents Gastrointestinal Medical History: Negative for: Hepatitis A; Hepatitis B; Hepatitis C Past Medical History Notes: GERD Endocrine Medical History: Positive for: Type II Diabetes Negative for: Type I Diabetes Past Medical History Notes: Hypothyroidism Time with diabetes: since 2002 Treated with: Insulin, Oral agents Blood sugar tested every day: Yes Tested : 2-3 times a day Genitourinary Medical History: Negative for: End Stage Renal Disease Immunological Medical History: Negative for: Lupus Erythematosus; Raynauds; Scleroderma 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 Integumentary (Skin) Medical History: Negative for: History of Burn Musculoskeletal Medical History: Positive for: Rheumatoid Arthritis Negative for: Gout; Osteoarthritis; Osteomyelitis Past Medical History Notes: Fibromyalgia, Scoliosis, Neurologic Medical History: Positive for: Neuropathy Negative for: Dementia; Quadriplegia; Paraplegia; Seizure Disorder Oncologic Medical History: Negative for: Received Chemotherapy; Received Radiation Bajaj, Payslee M ([id number]) [id number] Page 23 of 24 Psychiatric Medical History: Negative for: Anorexia/bulimia; Confinement Anxiety HBO Extended History Items Eyes: Cataracts Immunizations Pneumococcal Vaccine: Received Pneumococcal Vaccination: Yes Received Pneumococcal Vaccination On or After 60th Birthday: Yes Implantable Devices None Hospitalization / Surgery History Type of Hospitalization/Surgery 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 [name] Primary Care Provider: [name] Other Clinician: Referring Provider: Treating Provider/Extender: Vivien Rossetti in Treatment: 48 Debridement Performed for Assessment: Wound #22 Left,Medial Upper Leg Performed By: Physician Duanne Guess, MD The following information was scribed by: Karie Schwalbe The information was scribed for: Duanne Guess Debridement Type: Debridement Level of Consciousness (Pre-procedure): Awake and Alert Pre-procedure Verification/Time Out Yes - 13:45 Taken: Start Time: 13:45 Pain Control: Lidocaine 4% T opical Solution Percent of Wound Bed Debrided: 100% T Area Debrided (cm): otal 0.94 Tissue and other material debrided: Non-Viable, Slough, Biofilm, Slough Level: Non-Viable Tissue Debridement Description: Selective/Open Wound Instrument: Curette Bleeding: Large Hemostasis Achieved: Pressure End Time: 13:53 Procedural Pain: 0 Post Procedural Pain: 0 Response to  Treatment: Procedure was tolerated well Level of Consciousness (Post- Awake and Alert procedure): Post Debridement Measurements of Total Wound Length: (cm) 2 Stage: Category/Stage II Width: (cm) 0.6 Depth: (cm) 0.1 Volume: (cm) 0.094 Character of Wound/Ulcer Post Debridement: Improved Post Procedure Diagnosis Same as Pre-procedure Electronic Signature(s) Signed: 06/25/2023 4:07:03 PM By: Duanne Guess MD FACS Signed: 06/25/2023 4:30:49 PM By: Karie Schwalbe RN Entered By: Karie Schwalbe on 06/25/2023 14:00:18 -------------------------------------------------------------------------------- Debridement Details Patient Name: Date of Service: Campion, [name] 06/25/2023 1:00 PM Medical Record Number: [id number] Patient Account Number: [id number] Date of Birth/Sex: Treating RN: 1961-02-05 (62 y.o. [name] Primary Care Provider: [name] Other Clinician: Referring Provider: Treating Provider/Extender: Vivien Rossetti in Treatment: 48 Debridement Performed for Assessment: Wound #21 Medial Pubis Performed By: Physician Duanne Guess, MD The following information was scribed by: Karie Schwalbe The information was scribed for: Duanne Guess Debridement Type: Debridement Kopera, Vaishali M ([id number]) [id number] Page 4 of 24 Level of Consciousness (Pre-procedure): Awake and Alert Pre-procedure Verification/Time Out Yes - 13:45 Taken: Start Time: 13:45 Pain Control: Lidocaine 4% T opical Solution Percent of Wound Bed Debrided: 100% T Area Debrided (cm): otal 77.72 Tissue and other material debrided: Non-Viable, Slough, Biofilm, Slough Level: Non-Viable Tissue Debridement Description: Selective/Open Wound Instrument: Curette Bleeding: Large Hemostasis Achieved: Pressure End Time: 13:53 Procedural Pain: 0 Post Procedural Pain: 0 Response to Treatment: Procedure was tolerated well Level of  Consciousness (Post- Awake and Alert procedure): Post Debridement Measurements of Total Wound Length: (cm) 9 Stage: Category/Stage II Width: (cm) 11 Depth: (cm) 0.1 Volume: (cm) 7.775 Character of Wound/Ulcer Post Debridement: Improved Post Procedure Diagnosis Same as Pre-procedure Electronic Signature(s) Signed: 06/25/2023 4:07:03 PM By: Duanne Guess MD FACS Signed: 06/25/2023 4:30:49 PM By: Karie Schwalbe RN Entered By: Karie Schwalbe on 06/25/2023 14:01:10 -------------------------------------------------------------------------------- Debridement Details Patient Name: Date of Service: Villari, [name] 06/25/2023 1:00 PM Medical Record Number: [id number] Patient Account Number: [id number] Date of Birth/Sex: Treating RN: February 15, 1961 (62 y.o. 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 Performed By: Physician Duanne Guess, MD Debridement Type: Debridement Level of Consciousness (Pre-procedure): Awake and Alert Pre-procedure Verification/Time Out Yes - 13:45 Taken: Start Time: 13:45 Pain Control: Lidocaine 4% Topical Solution Percent of Wound Bed Debrided: 100% T Area Debrided (cm): otal 2.67 Tissue and other material debrided: Non-Viable, Eschar, Slough, Slough Level: Non-Viable Tissue Debridement Description: Selective/Open Wound Instrument: Curette Bleeding: Large Hemostasis Achieved: Pressure End Time: 13:53 Procedural Pain: 0 Post Procedural Pain: 0 Response to Treatment: Procedure was tolerated well Level of Consciousness (Post- Awake and Alert procedure):  Birth/Sex: Treating RN: 10/24/1960 (62 y.o. [name] Primary Care Provider: [name] Other Clinician: Referring Provider: Treating Provider/Extender: Vivien Rossetti in Treatment: 48 Debridement Performed for Assessment: Wound #22 Left,Medial Upper Leg Performed By: Physician Duanne Guess, MD The following information was scribed by: Karie Schwalbe The information was scribed for: Duanne Guess Debridement Type: Debridement Level of Consciousness (Pre-procedure): Awake and Alert Pre-procedure Verification/Time Out Yes - 13:45 Taken: Start Time: 13:45 Pain Control: Lidocaine 4% T opical Solution Percent of Wound Bed Debrided: 100% T Area Debrided (cm): otal 0.94 Tissue and other material debrided: Non-Viable, Slough, Biofilm, Slough Level: Non-Viable Tissue Debridement Description: Selective/Open Wound Instrument: Curette Bleeding: Large Hemostasis Achieved: Pressure End Time: 13:53 Procedural Pain: 0 Post Procedural Pain: 0 Response to  Treatment: Procedure was tolerated well Level of Consciousness (Post- Awake and Alert procedure): Post Debridement Measurements of Total Wound Length: (cm) 2 Stage: Category/Stage II Width: (cm) 0.6 Depth: (cm) 0.1 Volume: (cm) 0.094 Character of Wound/Ulcer Post Debridement: Improved Post Procedure Diagnosis Same as Pre-procedure Electronic Signature(s) Signed: 06/25/2023 4:07:03 PM By: Duanne Guess MD FACS Signed: 06/25/2023 4:30:49 PM By: Karie Schwalbe RN Entered By: Karie Schwalbe on 06/25/2023 14:00:18 -------------------------------------------------------------------------------- Debridement Details Patient Name: Date of Service: Campion, [name] 06/25/2023 1:00 PM Medical Record Number: [id number] Patient Account Number: [id number] Date of Birth/Sex: Treating RN: 1961-02-05 (62 y.o. [name] Primary Care Provider: [name] Other Clinician: Referring Provider: Treating Provider/Extender: Vivien Rossetti in Treatment: 48 Debridement Performed for Assessment: Wound #21 Medial Pubis Performed By: Physician Duanne Guess, MD The following information was scribed by: Karie Schwalbe The information was scribed for: Duanne Guess Debridement Type: Debridement Kopera, Vaishali M ([id number]) [id number] Page 4 of 24 Level of Consciousness (Pre-procedure): Awake and Alert Pre-procedure Verification/Time Out Yes - 13:45 Taken: Start Time: 13:45 Pain Control: Lidocaine 4% T opical Solution Percent of Wound Bed Debrided: 100% T Area Debrided (cm): otal 77.72 Tissue and other material debrided: Non-Viable, Slough, Biofilm, Slough Level: Non-Viable Tissue Debridement Description: Selective/Open Wound Instrument: Curette Bleeding: Large Hemostasis Achieved: Pressure End Time: 13:53 Procedural Pain: 0 Post Procedural Pain: 0 Response to Treatment: Procedure was tolerated well Level of  Consciousness (Post- Awake and Alert procedure): Post Debridement Measurements of Total Wound Length: (cm) 9 Stage: Category/Stage II Width: (cm) 11 Depth: (cm) 0.1 Volume: (cm) 7.775 Character of Wound/Ulcer Post Debridement: Improved Post Procedure Diagnosis Same as Pre-procedure Electronic Signature(s) Signed: 06/25/2023 4:07:03 PM By: Duanne Guess MD FACS Signed: 06/25/2023 4:30:49 PM By: Karie Schwalbe RN Entered By: Karie Schwalbe on 06/25/2023 14:01:10 -------------------------------------------------------------------------------- Debridement Details Patient Name: Date of Service: Villari, [name] 06/25/2023 1:00 PM Medical Record Number: [id number] Patient Account Number: [id number] Date of Birth/Sex: Treating RN: February 15, 1961 (62 y.o. 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 Performed By: Physician Duanne Guess, MD Debridement Type: Debridement Level of Consciousness (Pre-procedure): Awake and Alert Pre-procedure Verification/Time Out Yes - 13:45 Taken: Start Time: 13:45 Pain Control: Lidocaine 4% Topical Solution Percent of Wound Bed Debrided: 100% T Area Debrided (cm): otal 2.67 Tissue and other material debrided: Non-Viable, Eschar, Slough, Slough Level: Non-Viable Tissue Debridement Description: Selective/Open Wound Instrument: Curette Bleeding: Large Hemostasis Achieved: Pressure End Time: 13:53 Procedural Pain: 0 Post Procedural Pain: 0 Response to Treatment: Procedure was tolerated well Level of Consciousness (Post- Awake and Alert procedure):  edema is completely uncontrolled. I used a curette to debride slough off of all of her open wounds, along with some eschar on the AKA site. She has been mixing topical lidocaine, gentamicin, and mupirocin into Eucerin and using this as a wound dressing along with silver alginate. I have sent in prescriptions for the gentamicin, mupirocin, and lidocaine. Continue silver alginate. I told her that without her edema being better controlled, the wound on her foot would continue to simply for fluid out of it as the path of least resistance. Due to her challenges in getting here on a weekly basis, along with her other multiple medical appointments, she will follow-up in 1 month. Electronic Signature(s) Signed: 06/27/2023 3:07:10 PM By: Shawn Stall RN, BSN Signed: 06/27/2023 3:20:58 PM By: Duanne Guess MD FACS Previous Signature: 06/25/2023 2:50:11 PM Version By: Duanne Guess MD FACS Entered By: Shawn Stall on 06/27/2023 15:06:15 -------------------------------------------------------------------------------- HxROS Details Patient Name: Date of Service: Fruin, [name] 06/25/2023 1:00 PM Medical Record Number:  [id number] Patient Account Number: [id number] Date of Birth/Sex: Treating RN: 1961/07/12 (62 y.o. F) Primary Care Provider: [name] Other Clinician: Referring Provider: Treating Provider/Extender: Vivien Rossetti in Treatment: 48 Information Obtained From Patient Constitutional Symptoms (General Health) Medical History: Past Medical History Notes: hypothyroidism Eyes Medical History: Positive for: Cataracts - both eyes Negative for: Glaucoma; Optic Neuritis Apperson, Donnella M ([id number]) [id number] Page 22 of 24 Ear/Nose/Mouth/Throat Medical History: Negative for: Chronic sinus problems/congestion; Middle ear problems Hematologic/Lymphatic Medical History: Positive for: Lymphedema Negative for: Anemia; Hemophilia; Human Immunodeficiency Virus; Sickle Cell Disease Respiratory Medical History: Positive for: Asthma Negative for: Aspiration; Chronic Obstructive Pulmonary Disease (COPD); Pneumothorax; Sleep Apnea; Tuberculosis Cardiovascular Medical History: Positive for: Congestive Heart Failure; Coronary Artery Disease; Hypertension; Peripheral Arterial Disease; Peripheral Venous Disease Negative for: Angina; Arrhythmia; Hypotension; Myocardial Infarction; Phlebitis; Vasculitis Past Medical History Notes: cardiac stents Gastrointestinal Medical History: Negative for: Hepatitis A; Hepatitis B; Hepatitis C Past Medical History Notes: GERD Endocrine Medical History: Positive for: Type II Diabetes Negative for: Type I Diabetes Past Medical History Notes: Hypothyroidism Time with diabetes: since 2002 Treated with: Insulin, Oral agents Blood sugar tested every day: Yes Tested : 2-3 times a day Genitourinary Medical History: Negative for: End Stage Renal Disease Immunological Medical History: Negative for: Lupus Erythematosus; Raynauds; Scleroderma 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 Integumentary (Skin) Medical History: Negative for: History of Burn Musculoskeletal Medical History: Positive for: Rheumatoid Arthritis Negative for: Gout; Osteoarthritis; Osteomyelitis Past Medical History Notes: Fibromyalgia, Scoliosis, Neurologic Medical History: Positive for: Neuropathy Negative for: Dementia; Quadriplegia; Paraplegia; Seizure Disorder Oncologic Medical History: Negative for: Received Chemotherapy; Received Radiation Bajaj, Payslee M ([id number]) [id number] Page 23 of 24 Psychiatric Medical History: Negative for: Anorexia/bulimia; Confinement Anxiety HBO Extended History Items Eyes: Cataracts Immunizations Pneumococcal Vaccine: Received Pneumococcal Vaccination: Yes Received Pneumococcal Vaccination On or After 60th Birthday: Yes Implantable Devices None Hospitalization / Surgery History Type of Hospitalization/Surgery 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 [name] Primary Care Provider: [name] Other Clinician: Referring Provider: Treating Provider/Extender: Vivien Rossetti in Treatment: 48 Debridement Performed for Assessment: Wound #22 Left,Medial Upper Leg Performed By: Physician Duanne Guess, MD The following information was scribed by: Karie Schwalbe The information was scribed for: Duanne Guess Debridement Type: Debridement Level of Consciousness (Pre-procedure): Awake and Alert Pre-procedure Verification/Time Out Yes - 13:45 Taken: Start Time: 13:45 Pain Control: Lidocaine 4% T opical Solution Percent of Wound Bed Debrided: 100% T Area Debrided (cm): otal 0.94 Tissue and other material debrided: Non-Viable, Slough, Biofilm, Slough Level: Non-Viable Tissue Debridement Description: Selective/Open Wound Instrument: Curette Bleeding: Large Hemostasis Achieved: Pressure End Time: 13:53 Procedural Pain: 0 Post Procedural Pain: 0 Response to  Treatment: Procedure was tolerated well Level of Consciousness (Post- Awake and Alert procedure): Post Debridement Measurements of Total Wound Length: (cm) 2 Stage: Category/Stage II Width: (cm) 0.6 Depth: (cm) 0.1 Volume: (cm) 0.094 Character of Wound/Ulcer Post Debridement: Improved Post Procedure Diagnosis Same as Pre-procedure Electronic Signature(s) Signed: 06/25/2023 4:07:03 PM By: Duanne Guess MD FACS Signed: 06/25/2023 4:30:49 PM By: Karie Schwalbe RN Entered By: Karie Schwalbe on 06/25/2023 14:00:18 -------------------------------------------------------------------------------- Debridement Details Patient Name: Date of Service: Campion, [name] 06/25/2023 1:00 PM Medical Record Number: [id number] Patient Account Number: [id number] Date of Birth/Sex: Treating RN: 1961-02-05 (62 y.o. [name] Primary Care Provider: [name] Other Clinician: Referring Provider: Treating Provider/Extender: Vivien Rossetti in Treatment: 48 Debridement Performed for Assessment: Wound #21 Medial Pubis Performed By: Physician Duanne Guess, MD The following information was scribed by: Karie Schwalbe The information was scribed for: Duanne Guess Debridement Type: Debridement Kopera, Vaishali M ([id number]) [id number] Page 4 of 24 Level of Consciousness (Pre-procedure): Awake and Alert Pre-procedure Verification/Time Out Yes - 13:45 Taken: Start Time: 13:45 Pain Control: Lidocaine 4% T opical Solution Percent of Wound Bed Debrided: 100% T Area Debrided (cm): otal 77.72 Tissue and other material debrided: Non-Viable, Slough, Biofilm, Slough Level: Non-Viable Tissue Debridement Description: Selective/Open Wound Instrument: Curette Bleeding: Large Hemostasis Achieved: Pressure End Time: 13:53 Procedural Pain: 0 Post Procedural Pain: 0 Response to Treatment: Procedure was tolerated well Level of  Consciousness (Post- Awake and Alert procedure): Post Debridement Measurements of Total Wound Length: (cm) 9 Stage: Category/Stage II Width: (cm) 11 Depth: (cm) 0.1 Volume: (cm) 7.775 Character of Wound/Ulcer Post Debridement: Improved Post Procedure Diagnosis Same as Pre-procedure Electronic Signature(s) Signed: 06/25/2023 4:07:03 PM By: Duanne Guess MD FACS Signed: 06/25/2023 4:30:49 PM By: Karie Schwalbe RN Entered By: Karie Schwalbe on 06/25/2023 14:01:10 -------------------------------------------------------------------------------- Debridement Details Patient Name: Date of Service: Villari, [name] 06/25/2023 1:00 PM Medical Record Number: [id number] Patient Account Number: [id number] Date of Birth/Sex: Treating RN: February 15, 1961 (62 y.o. 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 Performed By: Physician Duanne Guess, MD Debridement Type: Debridement Level of Consciousness (Pre-procedure): Awake and Alert Pre-procedure Verification/Time Out Yes - 13:45 Taken: Start Time: 13:45 Pain Control: Lidocaine 4% Topical Solution Percent of Wound Bed Debrided: 100% T Area Debrided (cm): otal 2.67 Tissue and other material debrided: Non-Viable, Eschar, Slough, Slough Level: Non-Viable Tissue Debridement Description: Selective/Open Wound Instrument: Curette Bleeding: Large Hemostasis Achieved: Pressure End Time: 13:53 Procedural Pain: 0 Post Procedural Pain: 0 Response to Treatment: Procedure was tolerated well Level of Consciousness (Post- Awake and Alert procedure):  Non-pressure chronic ulcer of skin of other sites with fat layer exposed 12/26/2022 No Yes L97.822 Non-pressure chronic ulcer of other part of left lower leg with fat layer exposed4/16/2024 No Yes L30.4 Erythema intertrigo 12/26/2022 No Yes M86.9 Osteomyelitis, unspecified 07/19/2022 No Yes E11.621 Type 2 diabetes mellitus with foot ulcer 07/19/2022 No Yes E66.01 Morbid (severe) obesity due to excess calories 07/19/2022 No Yes Centrella, Charmagne M ([id number]) [id number] Page 14 of 24 M35.00 Sjogren syndrome, unspecified 07/19/2022 No Yes I73.9 Peripheral vascular disease, unspecified 07/19/2022 No Yes I50.32 Chronic diastolic (congestive) heart failure 07/19/2022 No Yes 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 06/25/2023 1:00 PM Medical Record Number: [id number] Patient Account Number:  [id number] Date of Birth/Sex: Treating RN: [birth year] (62 y.o. F) Primary Care Provider: [name] Other Clinician: Referring Provider: Treating Provider/Extender: Vivien Rossetti in Treatment: 48 Subjective Chief Complaint Information obtained from Patient 10/17/2021; multiple scattered wounds to the abdomen, posterior left leg, left labia and sacrum 07/19/2022: left foot DFU History of Present Illness (HPI) 07/23/2019 on evaluation today patient presents with a myriad of wounds noted at multiple locations over her right heel, right lower extremity, and abdominal region. She also has bilateral lower extremity lymphedema which is quite significant as well. Incidentally she also has congestive heart failure and hypertension. She also is obese. With that being said I think all this is contributing as well to her lower extremity edema which is very much uncontrolled. It seems like its been at least several months since she is worn any compression according to what she tells me. With that being said I am not sure exactly when that would have been. She does have fibrotic changes in the lower extremity secondary to lymphedema worse on the left than the right. She did show me pictures of wound she had on the anterior portion of her shin which was quite significant fortunately that has healed. These issues have been intermittent at this time. Again I think that with appropriate compression therapy she may actually be doing better than what we are seeing at this point but again I am not really sure that she is ever been extremely compliant with that. Fortunately there is no signs of active infection at this time. No fever chills noted. As far as the abdominal ulcer she initially had one wound that she thinks may have been a bug bite. Subsequently she was put a dressing on this and she states that the tape pulled skin off on other locations causing other wounds that have  not healed that is been about 3 months. The wounds on her lower extremities have been intermittent over 3 years. This includes the heel. 11/19; this is a patient that I have not seen previously. She was admitted to our clinic last week with multiple wounds including several superficial circular areas on her abdomen predominantly right upper quadrant. Also 1 in her umbilicus. She has an area on her right lateral malleolus which was new today. She has bilateral lower extremity edema with very significant stasis dermatitis on the left anterior tibial area. She has tightly adherent skin in her lower extremities probably secondary to cutaneous fibrosis in the area. We put her in compression last week. She comes in with the dressings reasonably saturated. She tells  Non-pressure chronic ulcer of skin of other sites with fat layer exposed 12/26/2022 No Yes L97.822 Non-pressure chronic ulcer of other part of left lower leg with fat layer exposed4/16/2024 No Yes L30.4 Erythema intertrigo 12/26/2022 No Yes M86.9 Osteomyelitis, unspecified 07/19/2022 No Yes E11.621 Type 2 diabetes mellitus with foot ulcer 07/19/2022 No Yes E66.01 Morbid (severe) obesity due to excess calories 07/19/2022 No Yes Centrella, Charmagne M ([id number]) [id number] Page 14 of 24 M35.00 Sjogren syndrome, unspecified 07/19/2022 No Yes I73.9 Peripheral vascular disease, unspecified 07/19/2022 No Yes I50.32 Chronic diastolic (congestive) heart failure 07/19/2022 No Yes 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 06/25/2023 1:00 PM Medical Record Number: [id number] Patient Account Number:  [id number] Date of Birth/Sex: Treating RN: [birth year] (62 y.o. F) Primary Care Provider: [name] Other Clinician: Referring Provider: Treating Provider/Extender: Vivien Rossetti in Treatment: 48 Subjective Chief Complaint Information obtained from Patient 10/17/2021; multiple scattered wounds to the abdomen, posterior left leg, left labia and sacrum 07/19/2022: left foot DFU History of Present Illness (HPI) 07/23/2019 on evaluation today patient presents with a myriad of wounds noted at multiple locations over her right heel, right lower extremity, and abdominal region. She also has bilateral lower extremity lymphedema which is quite significant as well. Incidentally she also has congestive heart failure and hypertension. She also is obese. With that being said I think all this is contributing as well to her lower extremity edema which is very much uncontrolled. It seems like its been at least several months since she is worn any compression according to what she tells me. With that being said I am not sure exactly when that would have been. She does have fibrotic changes in the lower extremity secondary to lymphedema worse on the left than the right. She did show me pictures of wound she had on the anterior portion of her shin which was quite significant fortunately that has healed. These issues have been intermittent at this time. Again I think that with appropriate compression therapy she may actually be doing better than what we are seeing at this point but again I am not really sure that she is ever been extremely compliant with that. Fortunately there is no signs of active infection at this time. No fever chills noted. As far as the abdominal ulcer she initially had one wound that she thinks may have been a bug bite. Subsequently she was put a dressing on this and she states that the tape pulled skin off on other locations causing other wounds that have  not healed that is been about 3 months. The wounds on her lower extremities have been intermittent over 3 years. This includes the heel. 11/19; this is a patient that I have not seen previously. She was admitted to our clinic last week with multiple wounds including several superficial circular areas on her abdomen predominantly right upper quadrant. Also 1 in her umbilicus. She has an area on her right lateral malleolus which was new today. She has bilateral lower extremity edema with very significant stasis dermatitis on the left anterior tibial area. She has tightly adherent skin in her lower extremities probably secondary to cutaneous fibrosis in the area. We put her in compression last week. She comes in with the dressings reasonably saturated. She tells  May shower and wash wound with dial antibacterial soap and water prior to dressing change. Cleanser: Wound Cleanser (Generic) 1 x Per Day/30 Days Discharge Instructions: Cleanse the wound with wound cleanser prior to applying a clean dressing using gauze sponges, not tissue or cotton balls. Topical: Gentamicin 1 x Per Day/30 Days Discharge Instructions: As directed by physician Topical: Mupirocin Ointment 1 x Per Day/30 Days Discharge Instructions: Apply Mupirocin (Bactroban) as instructed Prim Dressing: Maxorb Extra Ag+ Alginate Dressing, 4x4.75 (in/in) (Generic) 1 x Per Day/30 Days ary Discharge Instructions: Apply to wound bed as instructed Secondary Dressing: ABD Pad, 5x9 (Generic) 1 x Per Day/30 Days Discharge Instructions: Apply over primary dressing as directed. Secondary Dressing: Bordered Gauze, 4x4 in 1 x Per Day/30 Days Discharge Instructions: Apply over primary dressing as directed. Secondary Dressing: Woven Gauze Sponge, Non-Sterile 4x4 in (Generic) 1 x Per Day/30 Days Discharge Instructions: Apply over primary dressing as directed. Secured With: 60M Medipore Scientist, research (life sciences) Surgical T 2x10 (in/yd) (Generic) 1 x Per Day/30 Days ape Discharge Instructions: Secure with tape as directed. Wound #27 - Breast Wound Laterality: Left Cleanser: Soap and Water 1 x Per Day/30 Days Discharge Instructions: May shower and wash wound with dial antibacterial soap and water prior to dressing change. Cleanser: Wound Cleanser (Generic) 1 x Per Day/30 Days Discharge Instructions: Cleanse the wound with wound cleanser prior to applying a clean dressing using  gauze sponges, not tissue or cotton balls. Topical: Gentamicin 1 x Per Day/30 Days Discharge Instructions: As directed by physician Topical: Mupirocin Ointment 1 x Per Day/30 Days Discharge Instructions: Apply Mupirocin (Bactroban) as instructed Prim Dressing: Maxorb Extra Ag+ Alginate Dressing, 4x4.75 (in/in) (Generic) 1 x Per Day/30 Days ary Discharge Instructions: Apply to wound bed as instructed Secondary Dressing: ABD Pad, 5x9 (Generic) 1 x Per Day/30 Days Discharge Instructions: Apply over primary dressing as directed. Secondary Dressing: Bordered Gauze, 4x4 in 1 x Per Day/30 Days Discharge Instructions: Apply over primary dressing as directed. Secondary Dressing: Woven Gauze Sponge, Non-Sterile 4x4 in (Generic) 1 x Per Day/30 Days Discharge Instructions: Apply over primary dressing as directed. Soller, Greydis M ([id number]) [id number] Page 13 of 24 Secured With: 60M Science writer Surgical T 2x10 (in/yd) (Generic) 1 x Per Day/30 Days ape Discharge Instructions: Secure with tape as directed. Patient Medications llergies: peanut, morphine, gabapentin, iodine, Victoza, Pepto-Bismol, fish oil, Fish Containing Products, Crestor, lovastatin, metronidazole, Red Yeast Rice A (Monascus Purpureus), rotigotine, Atrovent, Suprep Bowel Prep Kit, adhesive tape, Betadine, Dial soap, Suprep Bowel Prep Kit Notifications Medication Indication Start End 06/25/2023 gentamicin DOSE topical 0.1 % ointment - use as directed for wound care 06/25/2023 mupirocin DOSE topical 2 % ointment - use as directed for wound care 06/25/2023 lidocaine DOSE topical 5 % ointment - use as directed for dressing changes Electronic Signature(s) Signed: 06/25/2023 4:07:03 PM By: Duanne Guess MD FACS Previous Signature: 06/25/2023 2:47:31 PM Version By: Duanne Guess MD FACS Entered By: Duanne Guess on 06/25/2023  14:48:13 -------------------------------------------------------------------------------- Problem List Details Patient Name: Date of Service: Tidd, [name] 06/25/2023 1:00 PM Medical Record Number: [id number] Patient Account Number: [id number] Date of Birth/Sex: Treating RN: 29-Mar-1961 (62 y.o. 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 06/25/2023 1:00 PM Medical Record Number: [id number] Patient Account Number: [id number] Date of Birth/Sex: Treating RN: [birth year] (62 y.o. F) Primary Care Provider: [name] Other Clinician: Referring Provider: Treating Provider/Extender: Vivien Rossetti in Treatment: 48 Information Obtained from: Patient Chief Complaint 10/17/2021; multiple scattered wounds to the abdomen, posterior left leg, left labia and sacrum 07/19/2022: left foot DFU Electronic Signature(s) Signed: 06/25/2023 2:25:23 PM By: Duanne Guess MD FACS Entered By: Duanne Guess on 06/25/2023 14:25:23 -------------------------------------------------------------------------------- Debridement Details Patient Name: Date of Service: Meleski, [name] 06/25/2023 1:00 PM Medical Record Number: [id number] Patient Account Number: [id number] Date of Birth/Sex: Treating RN: [birth year] (62 y.o. [name] Primary Care Provider: [name] Other Clinician: Referring Provider: Treating Provider/Extender: Vivien Rossetti in Treatment: 48 Debridement Performed for Assessment: Wound #27 Left Breast Performed By: Physician Duanne Guess, MD The following information was scribed by: Karie Schwalbe The information was scribed for: Duanne Guess Debridement Type: Debridement Level of Consciousness (Pre-procedure): Awake and Alert Pre-procedure Verification/Time Out Yes - 13:45 Taken: Start Time: 13:45 Pain Control: Lidocaine 4% T opical Solution Percent of Wound Bed Debrided: 100% T Area Debrided (cm): otal 11.3 Tissue and other material debrided: Non-Viable, Slough, Slough Level: Non-Viable Tissue Debridement Description: Selective/Open  Wound Instrument: Curette Bleeding: Large Hemostasis Achieved: Pressure End Time: 13:53 Procedural Pain: 0 Post Procedural Pain: 0 Response to Treatment: Procedure was tolerated well Level of Consciousness (Post- Awake and Alert procedure): Post Debridement Measurements of Total Wound Length: (cm) 8 Width: (cm) 1.8 Donado, Ksenia M ([id number]) [id number] Page 2 of 24 Depth: (cm) 0.1 Volume: (cm) 1.131 Character of Wound/Ulcer Post Debridement: Improved Post Procedure Diagnosis Same as Pre-procedure Electronic Signature(s) Signed: 06/25/2023 4:07:03 PM By: Duanne Guess MD FACS Signed: 06/25/2023 4:30:49 PM By: Karie Schwalbe RN Entered By: Karie Schwalbe on 06/25/2023 13:54:36 -------------------------------------------------------------------------------- Debridement Details Patient Name: Date of Service: Gunner, [name] 06/25/2023 1:00 PM Medical Record Number: [id number] Patient Account Number: [id number] Date of Birth/Sex: Treating RN: [birth year] (62 y.o. [name] Primary Care Provider: [name] Other Clinician: Referring Provider: Treating Provider/Extender: Vivien Rossetti in Treatment: 48 Debridement Performed for Assessment: Wound #23 Midline Umbilicus Performed By: Physician Duanne Guess, MD The following information was scribed by: Karie Schwalbe The information was scribed for: Duanne Guess Debridement Type: Debridement Level of Consciousness (Pre-procedure): Awake and Alert Pre-procedure Verification/Time Out Yes - 13:45 Taken: Start Time: 13:45 Pain Control: Lidocaine 4% T opical Solution Percent of Wound Bed Debrided: 100% T Area Debrided (cm): otal 7.77 Tissue and other material debrided: Non-Viable, Slough, Slough Level: Non-Viable Tissue Debridement Description: Selective/Open Wound Instrument: Curette Bleeding: Large Hemostasis Achieved: Pressure End Time:  13:53 Procedural Pain: 0 Post Procedural Pain: 0 Response to Treatment: Procedure was tolerated well Level of Consciousness (Post- Awake and Alert procedure): Post Debridement Measurements of Total Wound Length: (cm) 4.5 Width: (cm) 2.2 Depth: (cm) 0.1 Volume: (cm) 0.778 Character of Wound/Ulcer Post Debridement: Improved Post Procedure Diagnosis Same as Pre-procedure Electronic Signature(s) Signed: 06/25/2023 4:07:03 PM By: Duanne Guess MD FACS Signed: 06/25/2023 4:30:49 PM By: Karie Schwalbe RN Entered By: Karie Schwalbe on 06/25/2023 13:57:13 Lolli, Lorane M ([id number]) [id number] Page 3 of 24 -------------------------------------------------------------------------------- Debridement Details Patient Name: Date of Service: Gilkeson, [name] 06/25/2023 1:00 PM Medical Record Number: [id number] Patient Account Number: [id number] Date of  May shower and wash wound with dial antibacterial soap and water prior to dressing change. Cleanser: Wound Cleanser (Generic) 1 x Per Day/30 Days Discharge Instructions: Cleanse the wound with wound cleanser prior to applying a clean dressing using gauze sponges, not tissue or cotton balls. Topical: Gentamicin 1 x Per Day/30 Days Discharge Instructions: As directed by physician Topical: Mupirocin Ointment 1 x Per Day/30 Days Discharge Instructions: Apply Mupirocin (Bactroban) as instructed Prim Dressing: Maxorb Extra Ag+ Alginate Dressing, 4x4.75 (in/in) (Generic) 1 x Per Day/30 Days ary Discharge Instructions: Apply to wound bed as instructed Secondary Dressing: ABD Pad, 5x9 (Generic) 1 x Per Day/30 Days Discharge Instructions: Apply over primary dressing as directed. Secondary Dressing: Bordered Gauze, 4x4 in 1 x Per Day/30 Days Discharge Instructions: Apply over primary dressing as directed. Secondary Dressing: Woven Gauze Sponge, Non-Sterile 4x4 in (Generic) 1 x Per Day/30 Days Discharge Instructions: Apply over primary dressing as directed. Secured With: 60M Medipore Scientist, research (life sciences) Surgical T 2x10 (in/yd) (Generic) 1 x Per Day/30 Days ape Discharge Instructions: Secure with tape as directed. Wound #27 - Breast Wound Laterality: Left Cleanser: Soap and Water 1 x Per Day/30 Days Discharge Instructions: May shower and wash wound with dial antibacterial soap and water prior to dressing change. Cleanser: Wound Cleanser (Generic) 1 x Per Day/30 Days Discharge Instructions: Cleanse the wound with wound cleanser prior to applying a clean dressing using  gauze sponges, not tissue or cotton balls. Topical: Gentamicin 1 x Per Day/30 Days Discharge Instructions: As directed by physician Topical: Mupirocin Ointment 1 x Per Day/30 Days Discharge Instructions: Apply Mupirocin (Bactroban) as instructed Prim Dressing: Maxorb Extra Ag+ Alginate Dressing, 4x4.75 (in/in) (Generic) 1 x Per Day/30 Days ary Discharge Instructions: Apply to wound bed as instructed Secondary Dressing: ABD Pad, 5x9 (Generic) 1 x Per Day/30 Days Discharge Instructions: Apply over primary dressing as directed. Secondary Dressing: Bordered Gauze, 4x4 in 1 x Per Day/30 Days Discharge Instructions: Apply over primary dressing as directed. Secondary Dressing: Woven Gauze Sponge, Non-Sterile 4x4 in (Generic) 1 x Per Day/30 Days Discharge Instructions: Apply over primary dressing as directed. Soller, Greydis M ([id number]) [id number] Page 13 of 24 Secured With: 60M Science writer Surgical T 2x10 (in/yd) (Generic) 1 x Per Day/30 Days ape Discharge Instructions: Secure with tape as directed. Patient Medications llergies: peanut, morphine, gabapentin, iodine, Victoza, Pepto-Bismol, fish oil, Fish Containing Products, Crestor, lovastatin, metronidazole, Red Yeast Rice A (Monascus Purpureus), rotigotine, Atrovent, Suprep Bowel Prep Kit, adhesive tape, Betadine, Dial soap, Suprep Bowel Prep Kit Notifications Medication Indication Start End 06/25/2023 gentamicin DOSE topical 0.1 % ointment - use as directed for wound care 06/25/2023 mupirocin DOSE topical 2 % ointment - use as directed for wound care 06/25/2023 lidocaine DOSE topical 5 % ointment - use as directed for dressing changes Electronic Signature(s) Signed: 06/25/2023 4:07:03 PM By: Duanne Guess MD FACS Previous Signature: 06/25/2023 2:47:31 PM Version By: Duanne Guess MD FACS Entered By: Duanne Guess on 06/25/2023  14:48:13 -------------------------------------------------------------------------------- Problem List Details Patient Name: Date of Service: Tidd, [name] 06/25/2023 1:00 PM Medical Record Number: [id number] Patient Account Number: [id number] Date of Birth/Sex: Treating RN: 29-Mar-1961 (62 y.o. 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 06/25/2023 1:00 PM Medical Record Number: [id number] Patient Account Number: [id number] Date of Birth/Sex: Treating RN: [birth year] (62 y.o. F) Primary Care Provider: [name] Other Clinician: Referring Provider: Treating Provider/Extender: Vivien Rossetti in Treatment: 48 Information Obtained from: Patient Chief Complaint 10/17/2021; multiple scattered wounds to the abdomen, posterior left leg, left labia and sacrum 07/19/2022: left foot DFU Electronic Signature(s) Signed: 06/25/2023 2:25:23 PM By: Duanne Guess MD FACS Entered By: Duanne Guess on 06/25/2023 14:25:23 -------------------------------------------------------------------------------- Debridement Details Patient Name: Date of Service: Meleski, [name] 06/25/2023 1:00 PM Medical Record Number: [id number] Patient Account Number: [id number] Date of Birth/Sex: Treating RN: [birth year] (62 y.o. [name] Primary Care Provider: [name] Other Clinician: Referring Provider: Treating Provider/Extender: Vivien Rossetti in Treatment: 48 Debridement Performed for Assessment: Wound #27 Left Breast Performed By: Physician Duanne Guess, MD The following information was scribed by: Karie Schwalbe The information was scribed for: Duanne Guess Debridement Type: Debridement Level of Consciousness (Pre-procedure): Awake and Alert Pre-procedure Verification/Time Out Yes - 13:45 Taken: Start Time: 13:45 Pain Control: Lidocaine 4% T opical Solution Percent of Wound Bed Debrided: 100% T Area Debrided (cm): otal 11.3 Tissue and other material debrided: Non-Viable, Slough, Slough Level: Non-Viable Tissue Debridement Description: Selective/Open  Wound Instrument: Curette Bleeding: Large Hemostasis Achieved: Pressure End Time: 13:53 Procedural Pain: 0 Post Procedural Pain: 0 Response to Treatment: Procedure was tolerated well Level of Consciousness (Post- Awake and Alert procedure): Post Debridement Measurements of Total Wound Length: (cm) 8 Width: (cm) 1.8 Donado, Ksenia M ([id number]) [id number] Page 2 of 24 Depth: (cm) 0.1 Volume: (cm) 1.131 Character of Wound/Ulcer Post Debridement: Improved Post Procedure Diagnosis Same as Pre-procedure Electronic Signature(s) Signed: 06/25/2023 4:07:03 PM By: Duanne Guess MD FACS Signed: 06/25/2023 4:30:49 PM By: Karie Schwalbe RN Entered By: Karie Schwalbe on 06/25/2023 13:54:36 -------------------------------------------------------------------------------- Debridement Details Patient Name: Date of Service: Gunner, [name] 06/25/2023 1:00 PM Medical Record Number: [id number] Patient Account Number: [id number] Date of Birth/Sex: Treating RN: [birth year] (62 y.o. [name] Primary Care Provider: [name] Other Clinician: Referring Provider: Treating Provider/Extender: Vivien Rossetti in Treatment: 48 Debridement Performed for Assessment: Wound #23 Midline Umbilicus Performed By: Physician Duanne Guess, MD The following information was scribed by: Karie Schwalbe The information was scribed for: Duanne Guess Debridement Type: Debridement Level of Consciousness (Pre-procedure): Awake and Alert Pre-procedure Verification/Time Out Yes - 13:45 Taken: Start Time: 13:45 Pain Control: Lidocaine 4% T opical Solution Percent of Wound Bed Debrided: 100% T Area Debrided (cm): otal 7.77 Tissue and other material debrided: Non-Viable, Slough, Slough Level: Non-Viable Tissue Debridement Description: Selective/Open Wound Instrument: Curette Bleeding: Large Hemostasis Achieved: Pressure End Time:  13:53 Procedural Pain: 0 Post Procedural Pain: 0 Response to Treatment: Procedure was tolerated well Level of Consciousness (Post- Awake and Alert procedure): Post Debridement Measurements of Total Wound Length: (cm) 4.5 Width: (cm) 2.2 Depth: (cm) 0.1 Volume: (cm) 0.778 Character of Wound/Ulcer Post Debridement: Improved Post Procedure Diagnosis Same as Pre-procedure Electronic Signature(s) Signed: 06/25/2023 4:07:03 PM By: Duanne Guess MD FACS Signed: 06/25/2023 4:30:49 PM By: Karie Schwalbe RN Entered By: Karie Schwalbe on 06/25/2023 13:57:13 Lolli, Lorane M ([id number]) [id number] Page 3 of 24 -------------------------------------------------------------------------------- Debridement Details Patient Name: Date of Service: Gilkeson, [name] 06/25/2023 1:00 PM Medical Record Number: [id number] Patient Account Number: [id number] Date of  edema is completely uncontrolled. I used a curette to debride slough off of all of her open wounds, along with some eschar on the AKA site. She has been mixing topical lidocaine, gentamicin, and mupirocin into Eucerin and using this as a wound dressing along with silver alginate. I have sent in prescriptions for the gentamicin, mupirocin, and lidocaine. Continue silver alginate. I told her that without her edema being better controlled, the wound on her foot would continue to simply for fluid out of it as the path of least resistance. Due to her challenges in getting here on a weekly basis, along with her other multiple medical appointments, she will follow-up in 1 month. Electronic Signature(s) Signed: 06/27/2023 3:07:10 PM By: Shawn Stall RN, BSN Signed: 06/27/2023 3:20:58 PM By: Duanne Guess MD FACS Previous Signature: 06/25/2023 2:50:11 PM Version By: Duanne Guess MD FACS Entered By: Shawn Stall on 06/27/2023 15:06:15 -------------------------------------------------------------------------------- HxROS Details Patient Name: Date of Service: Fruin, [name] 06/25/2023 1:00 PM Medical Record Number:  [id number] Patient Account Number: [id number] Date of Birth/Sex: Treating RN: 1961/07/12 (62 y.o. F) Primary Care Provider: [name] Other Clinician: Referring Provider: Treating Provider/Extender: Vivien Rossetti in Treatment: 48 Information Obtained From Patient Constitutional Symptoms (General Health) Medical History: Past Medical History Notes: hypothyroidism Eyes Medical History: Positive for: Cataracts - both eyes Negative for: Glaucoma; Optic Neuritis Apperson, Donnella M ([id number]) [id number] Page 22 of 24 Ear/Nose/Mouth/Throat Medical History: Negative for: Chronic sinus problems/congestion; Middle ear problems Hematologic/Lymphatic Medical History: Positive for: Lymphedema Negative for: Anemia; Hemophilia; Human Immunodeficiency Virus; Sickle Cell Disease Respiratory Medical History: Positive for: Asthma Negative for: Aspiration; Chronic Obstructive Pulmonary Disease (COPD); Pneumothorax; Sleep Apnea; Tuberculosis Cardiovascular Medical History: Positive for: Congestive Heart Failure; Coronary Artery Disease; Hypertension; Peripheral Arterial Disease; Peripheral Venous Disease Negative for: Angina; Arrhythmia; Hypotension; Myocardial Infarction; Phlebitis; Vasculitis Past Medical History Notes: cardiac stents Gastrointestinal Medical History: Negative for: Hepatitis A; Hepatitis B; Hepatitis C Past Medical History Notes: GERD Endocrine Medical History: Positive for: Type II Diabetes Negative for: Type I Diabetes Past Medical History Notes: Hypothyroidism Time with diabetes: since 2002 Treated with: Insulin, Oral agents Blood sugar tested every day: Yes Tested : 2-3 times a day Genitourinary Medical History: Negative for: End Stage Renal Disease Immunological Medical History: Negative for: Lupus Erythematosus; Raynauds; Scleroderma 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 Integumentary (Skin) Medical History: Negative for: History of Burn Musculoskeletal Medical History: Positive for: Rheumatoid Arthritis Negative for: Gout; Osteoarthritis; Osteomyelitis Past Medical History Notes: Fibromyalgia, Scoliosis, Neurologic Medical History: Positive for: Neuropathy Negative for: Dementia; Quadriplegia; Paraplegia; Seizure Disorder Oncologic Medical History: Negative for: Received Chemotherapy; Received Radiation Bajaj, Payslee M ([id number]) [id number] Page 23 of 24 Psychiatric Medical History: Negative for: Anorexia/bulimia; Confinement Anxiety HBO Extended History Items Eyes: Cataracts Immunizations Pneumococcal Vaccine: Received Pneumococcal Vaccination: Yes Received Pneumococcal Vaccination On or After 60th Birthday: Yes Implantable Devices None Hospitalization / Surgery History Type of Hospitalization/Surgery 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 Family and Social History Cancer: No; Diabetes: Yes - Mother; Heart Disease: Yes - Mother,Father,Siblings; Hereditary Spherocytosis: No; Hypertension: Yes - Mother,Father,Siblings; Kidney Disease: No; Lung Disease: Yes - Father; Seizures: No; Stroke: Yes - Mother; Thyroid Problems: No; Tuberculosis: No; Former smoker - quit 15 years ago; Marital Status - Married; Alcohol Use: Never; Drug Use: No History; Caffeine Use: Rarely; Financial Concerns: No; Food, Clothing or Shelter Needs: No; Support System Lacking:  she had a Ruggerio, Viera M ([id number])  [id number] Page 9 of 24 period where there was a lot of drainage that resulted in tissue breakdown. She is clearly having some drainage from her left ankle, as well as there is slough accumulation on a cracked skin surface. She has a new wound on her left breast, just medial to the existing wound. She says she has no idea how it started but that she simply woke up one morning with a wound there and her entire breast reddened. She denies any use of external heat source such as a heating pad. All of the other wounds are about the same with slough accumulation. The new breast wound has some eschar and fibrotic surface. The periumbilical wound also has some eschar accumulation. 04/26/2023: The right lower quadrant wound has healed. The other wounds are looking a little bit better in terms of their surfaces with minimal accumulation of slough and eschar. She is currently taking doxycycline prescribed by her PCP for cellulitis in her left leg. 06/25/2023: In the 45-month interval since she last made it to clinic, she has managed to get a number of her smaller wounds healed. She is down to 7 open sites at this time. There is an open wound on her left breast, midline abdomen, left lower quadrant, the mons pubis, the back of her left leg, her AKA stump, and her left heel and midfoot. Her edema is completely uncontrolled. Electronic Signature(s) Signed: 06/25/2023 2:30:30 PM By: Duanne Guess MD FACS Entered By: Duanne Guess on 06/25/2023 14:30:30 -------------------------------------------------------------------------------- Physical Exam Details Patient Name: Date of Service: Mayweather, [name] 06/25/2023 1:00 PM Medical Record Number: [id number] Patient Account Number: [id number] Date of Birth/Sex: Treating RN: 1961/03/10 (62 y.o. F) Primary Care Provider: [name] Other Clinician: Referring Provider: Treating Provider/Extender: Vivien Rossetti in Treatment: 48 Constitutional . . no acute distress. Respiratory Normal work of breathing on room air. Notes 06/25/2023: She is down to 7 open sites at this time. There is an open wound on her left breast, midline abdomen, left lower quadrant, the mons pubis, the back of her left leg, her AKA stump, and her left heel and midfoot. Her edema is completely uncontrolled. Electronic Signature(s) Signed: 06/25/2023 2:44:03 PM By: Duanne Guess MD FACS Previous Signature: 06/25/2023 2:31:35 PM Version By: Duanne Guess MD FACS Entered By: Duanne Guess on 06/25/2023 14:44:03 -------------------------------------------------------------------------------- Physician Orders Details Patient Name: Date of Service: Vandervelden, [name] 06/25/2023 1:00 PM Medical Record Number: [id number] Patient Account Number: [id number] Date of Birth/Sex: Treating RN: 1960-11-06 (62 y.o. 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 L98.492 Non-pressure chronic ulcer of skin of other sites with fat layer exposed L97.822 Non-pressure chronic ulcer of other part of left lower leg with fat layer exposed [place], Daziyah M ([id number]) [id number] Page 10 of 24 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 - **** EXTRA TIME at least 1.5 hrs COMPLEX patient****Stretcher room

## 2023-06-25 NOTE — Progress Notes (Signed)
using gauze sponges, not tissue or cotton balls. Peri-Wound Care Topical Gentamicin Discharge Instruction: As directed by physician Mupirocin Ointment Discharge Instruction: Apply Mupirocin (Bactroban) as instructed Primary Dressing Maxorb Extra Ag+ Alginate Dressing, 4x4.75 (in/in) Discharge Instruction: Apply to wound bed as instructed Secondary Dressing ABD Pad, 5x9 Discharge Instruction: Apply over primary dressing as directed. Bordered Gauze, 4x4 in Discharge Instruction: Apply over primary dressing as directed. Woven Gauze Sponge, Non-Sterile 4x4 in Discharge Instruction: Apply over primary dressing as directed. Secured With [employer] T 2x10 (in/yd) ape Discharge Instruction: Secure with tape as directed. Compression Wrap Compression Stockings Add-Ons Electronic Signature(s) Signed: 06/25/2023 4:30:49 PM By: Karie Schwalbe RN Entered By: Karie Schwalbe on 06/25/2023 10:31:52 -------------------------------------------------------------------------------- Wound Assessment Details Patient Name: Date of  Service: [name] [name] [name] 06/25/2023 1:00 PM Medical Record Number: [id number] Patient Account Number: [id number] [name] [name] [name] ([id number]) [id number] Page 13 of 20 Date of Birth/Sex: Treating RN: 1960-12-03 (62 y.o. [name] [name] Primary Care [name] [name]: Other [name]: [name] Referring [name] [name]: Treating [name] [name]/Extender: [name] [name] in Treatment: 48 Wound Status Wound Number: 22 Primary Pressure Ulcer Etiology: Wound Location: Left, Medial Upper Leg Wound Open Wounding Event: Pressure Injury Status: Date Acquired: 12/14/2022 Comorbid Cataracts, Lymphedema, Asthma, Congestive Heart Failure, Weeks Of Treatment: 25 History: Coronary Artery Disease, Hypertension, Peripheral Arterial Disease, Clustered Wound: Yes Peripheral Venous Disease, Type II Diabetes, Rheumatoid Arthritis, Neuropathy Photos Wound Measurements Length: (cm) Width: (cm) Depth: (cm) Clustered Quantity: Area: (cm) Volume: (cm) 2 % Reduction in Area: 95.2% 0.6 % Reduction in Volume: 95.2% 0.1 Epithelialization: None 5 Tunneling: No 0.942 Undermining: No 0.094 Wound Description Classification: Category/Stage II Exudate Amount: Medium Exudate Type: Serosanguineous Exudate Color: red, brown Foul Odor After Cleansing: No Slough/Fibrino Yes Wound Bed Granulation Amount: Large (67-100%) Exposed Structure Granulation Quality: Red Fascia Exposed: No Necrotic Amount: Small (1-33%) Fat Layer (Subcutaneous Tissue) Exposed: Yes Necrotic Quality: Adherent Slough Tendon Exposed: No Muscle Exposed: No Joint Exposed: No Bone Exposed: No Periwound Skin Texture Texture Color No Abnormalities Noted: No No Abnormalities Noted: Yes Scarring: Yes Temperature / Pain Temperature: No Abnormality Moisture No Abnormalities Noted: Yes Treatment Notes Wound #22 (Upper Leg) Wound Laterality: Left, Medial Cleanser Soap and Water Discharge  Instruction: May shower and wash wound with dial antibacterial soap and water prior to dressing change. Wound Cleanser Discharge Instruction: Cleanse the wound with wound cleanser prior to applying a clean dressing using gauze sponges, not tissue or cotton balls. Peri-Wound Care Topical Gentamicin Discharge Instruction: As directed by physician [name] [name] [name] ([id number]) [id number] Page 14 of 20 Mupirocin Ointment Discharge Instruction: Apply Mupirocin (Bactroban) as instructed Primary Dressing Maxorb Extra Ag+ Alginate Dressing, 4x4.75 (in/in) Discharge Instruction: Apply to wound bed as instructed Secondary Dressing ABD Pad, 5x9 Discharge Instruction: Apply over primary dressing as directed. Woven Gauze Sponge, Non-Sterile 4x4 in Discharge Instruction: Apply over primary dressing as directed. Secured With Elastic Bandage 4 inch (ACE bandage) Discharge Instruction: Secure with ACE bandage as directed. Kerlix Roll Sterile, 4.5x3.1 (in/yd) Discharge Instruction: Secure with Kerlix as directed. 60M Medipore Soft Cloth Surgical T 2x10 (in/yd) ape Discharge Instruction: Secure with tape as directed. Compression Wrap Compression Stockings Add-Ons Electronic Signature(s) Signed: 06/25/2023 4:30:49 PM By: Karie Schwalbe RN Entered By: Karie Schwalbe on 06/25/2023 10:34:45 -------------------------------------------------------------------------------- Wound Assessment Details Patient Name: Date of Service: [name] [name] [name] 06/25/2023 1:00 PM Medical Record Number: [id number] Patient Account Number: [id number] Date of Birth/Sex: Treating RN: [birth year] (62 y.o. [name] [name] Primary Care Fatmata Legere:  Pain: Procedure was tolerated well Procedure was tolerated well N/A Debridement Treatment Response: 7x2.7x0.1 8x1.8x0.1 N/A Post Debridement Measurements L x W x D (cm) 1.484 1.131 N/A Post Debridement Volume: (cm) N/A N/A N/A Post Debridement Stage: No Abnormalities Noted No Abnormalities Noted N/A Periwound Skin Texture: Maceration: Yes No Abnormalities Noted N/A Periwound Skin Moisture: No Abnormalities Noted No Abnormalities Noted N/A Periwound Skin Color: No Abnormality No Abnormality N/A Temperature: Debridement Debridement N/A Procedures Performed: Treatment Notes Electronic Signature(s) Signed: 06/25/2023 2:25:06 PM By: [name] Guess MD FACS Entered By: [name] [name] on 06/25/2023 11:25:06 -------------------------------------------------------------------------------- Multi-Disciplinary Care Plan Details Patient Name: Date of Service: [name] [name] [name] 06/25/2023 1:00 PM Medical Record Number: [id number] Patient Account Number: [id number] Date of Birth/Sex: Treating RN: 05-05-1961 (62 y.o. [name] [name] Primary Care [name] [name]: [name] [name]: Referring [name] [name]: Treating [name] [name]/Extender: [name] [name] in Treatment: 48 Multidisciplinary Care Plan reviewed with physician Active Inactive Venous Leg Ulcer Nursing Diagnoses: Potential for venous Insuffiency (use before diagnosis confirmed) Goals: [name] [name] ([id number]) [id number] Page 6 of 20 Patient will maintain optimal edema control Date Initiated: 07/19/2022 Target Resolution Date: 08/12/2026 Goal Status: Active Interventions: Assess peripheral edema status every visit. Treatment Activities: Therapeutic  compression applied : 07/19/2022 Notes: Wound/Skin Impairment Nursing Diagnoses: Impaired tissue integrity Knowledge deficit related to ulceration/compromised skin integrity Goals: Patient/caregiver will verbalize understanding of skin care regimen Date Initiated: 11/14/2022 Target Resolution Date: 08/12/2026 Goal Status: Active Interventions: Assess patient/caregiver ability to obtain necessary supplies Assess patient/caregiver ability to perform ulcer/skin care regimen upon admission and as needed Assess ulceration(s) every visit Treatment Activities: Skin care regimen initiated : 11/14/2022 Topical wound management initiated : 11/14/2022 Notes: Electronic Signature(s) Signed: 06/25/2023 4:30:49 PM By: Karie Schwalbe RN Entered By: Karie Schwalbe on 06/25/2023 13:26:16 -------------------------------------------------------------------------------- Pain Assessment Details Patient Name: Date of Service: [name] [name] [name] 06/25/2023 1:00 PM Medical Record Number: [id number] Patient Account Number: [id number] Date of Birth/Sex: Treating RN: 10/19/1960 (62 y.o. [name] [name] Primary Care Gray Doering: [name] [name]: Referring Gus Littler: Treating Khristopher Kapaun/Extender: [name] [name] in Treatment: 48 Active Problems Location of Pain Severity and Description of Pain Patient Has Paino Yes Site Locations Pain Location: [place], Marycatherine [name] ([id number]) [id number] Page 7 of 20 Pain Location: Generalized Pain With Dressing Change: No Duration of the Pain. Constant / Intermittento Constant Rate the pain. Current Pain Level: 8 Worst Pain Level: 10 Least Pain Level: 5 Tolerable Pain Level: 5 Character of Pain Describe the Pain: Difficult to Pinpoint Pain Management and Medication Current Pain Management: Medication: Yes Cold Application: No Rest: Yes Massage: No Activity: No T.E.N.S.: No Heat Application:  No Leg drop or elevation: No Is the Current Pain Management Adequate: Adequate How does your wound impact your activities of daily livingo Sleep: No Bathing: No Appetite: No Relationship With Others: No Bladder Continence: No Emotions: No Bowel Continence: No Work: No Toileting: No Drive: No Dressing: No Hobbies: No Electronic Signature(s) Signed: 06/25/2023 4:30:49 PM By: Karie Schwalbe RN Entered By: Karie Schwalbe on 06/25/2023 10:17:38 -------------------------------------------------------------------------------- Patient/Caregiver Education Details Patient Name: Date of Service: [name] [name] [name] 10/14/2024andnbsp1:00 PM Medical Record Number: [id number] Patient Account Number: [id number] Date of Birth/Gender: Treating RN: Sep 07, 1961 (62 y.o. [name] [name] Primary Care Physician: [name] [name]: Referring Physician: Treating Physician/Extender: [name] [name] in Treatment: 62 Education Assessment Education Provided To: Patient Education Topics Provided Wound/Skin Impairment: Methods: Demonstration, Explain/Verbal Responses: State content correctly [employer]) Signed: 06/25/2023 4:30:49 PM By:  Venous Disease, Type II Diabetes, Rheumatoid Arthritis, Neuropathy Photos Wound Measurements Length: (cm) Width: (cm) Depth: (cm) Clustered Quantity: Area: (cm) Volume: (cm) 2 % Reduction in Area: 85.8% 1.7 % Reduction in Volume: 85.8% 0.1 Epithelialization: Medium (34-66%) 4 Tunneling: No 2.67 Undermining: No 0.267 Wound Description Classification: Category/Stage II Exudate Amount: Medium Exudate Type: Serosanguineous Exudate Color: red, brown Foul Odor After Cleansing: No Slough/Fibrino No Wound Bed Granulation Amount: Large (67-100%) Exposed Structure Granulation Quality: Red Fascia Exposed: No Necrotic Amount: Small (1-33%) Fat Layer (Subcutaneous Tissue) Exposed: Yes Necrotic Quality: Eschar, Adherent Slough Tendon Exposed: No Muscle Exposed: No Joint Exposed: No Bone Exposed: No Periwound Skin Texture Texture Color No Abnormalities Noted: No No Abnormalities Noted: Yes Scarring: Yes Moisture No Abnormalities Noted: Yes Treatment Notes 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 Wound Cleanser Discharge Instruction: Cleanse the wound with wound cleanser prior to applying a clean dressing using gauze sponges, not tissue or cotton balls. Peri-Wound Care Topical Gentamicin Discharge Instruction: As directed by physician Mupirocin Ointment Discharge Instruction: Apply Mupirocin (Bactroban) as instructed Primary Dressing Maxorb Extra Ag+ Alginate Dressing, 4x4.75 (in/in) Discharge Instruction: Apply to wound bed as instructed Secondary Dressing [name], Little [name] ([id number]) [id number] Page 11 of 20 ABD Pad, 5x9 Discharge Instruction: Apply over primary  dressing as directed. Bordered Gauze, 4x4 in Discharge Instruction: Apply over primary dressing as directed. Woven Gauze Sponge, Non-Sterile 4x4 in Discharge Instruction: Apply over primary dressing as directed. Secured With [employer] T 2x10 (in/yd) ape Discharge Instruction: Secure with tape as directed. Compression Wrap Compression Stockings Add-Ons Electronic Signature(s) Signed: 06/25/2023 4:30:49 PM By: Karie Schwalbe RN Entered By: Karie Schwalbe on 06/25/2023 10:27:05 -------------------------------------------------------------------------------- Wound Assessment Details Patient Name: Date of Service: [name] [name] [name] 06/25/2023 1:00 PM Medical Record Number: [id number] Patient Account Number: [id number] Date of Birth/Sex: Treating RN: 1961/04/25 (62 y.o. [name] [name] Primary Care Manasvini Whatley: [name] [name]: Referring Imre Vecchione: Treating Nasteho Glantz/Extender: [name] [name] in Treatment: 48 Wound Status Wound Number: 21 Primary Pressure Ulcer Etiology: Wound Location: Medial Pubis Wound Open Wounding Event: Pressure Injury Status: Date Acquired: 12/14/2022 Comorbid Cataracts, Lymphedema, Asthma, Congestive Heart Failure, Weeks Of Treatment: 25 History: Coronary Artery Disease, Hypertension, Peripheral Arterial Disease, Clustered Wound: Yes Peripheral Venous Disease, Type II Diabetes, Rheumatoid Arthritis, Neuropathy Photos Wound Measurements Length: (cm) Width: (cm) Depth: (cm) Clustered Quantity: Area: (cm) Volume: (cm) 9 % Reduction in Area: -90.4% 11 % Reduction in Volume: -90.4% 0.1 Epithelialization: Large (67-100%) 5 Tunneling: No 77.754 Undermining: No 7.775 Wound Description Classification: Category/Stage II Exudate Amount: Medium Exudate Type: Serosanguineous Exudate Color: red, brown Ilic, Kayleah [name] ([id number]) Foul Odor After Cleansing: No Slough/Fibrino  Yes [id number] Page 12 of 20 Wound Bed Granulation Amount: Large (67-100%) Exposed Structure Granulation Quality: Red Fascia Exposed: No Necrotic Amount: Small (1-33%) Fat Layer (Subcutaneous Tissue) Exposed: Yes Necrotic Quality: Adherent Slough Tendon Exposed: No Muscle Exposed: No Joint Exposed: No Bone Exposed: No Periwound Skin Texture Texture Color No Abnormalities Noted: No No Abnormalities Noted: Yes Scarring: Yes Temperature / Pain Temperature: No Abnormality Moisture No Abnormalities Noted: Yes Treatment Notes Wound #21 (Pubis) Wound Laterality: Medial Cleanser Soap and Water Discharge Instruction: May shower and wash wound with dial antibacterial soap and water prior to dressing change. Wound Cleanser Discharge Instruction: Cleanse the wound with wound cleanser prior to applying a clean dressing  Texture Color No Abnormalities Noted: Yes No Abnormalities Noted: Yes Moisture Temperature / Pain No Abnormalities Noted: Yes Temperature: No Abnormality Electronic Signature(s) Signed: 06/25/2023 4:30:49 PM By: Karie Schwalbe RN Entered By: Karie Schwalbe on 06/25/2023 10:38:15 [name], Syenna [name] ([id number]) [id number] Page 17 of 20 -------------------------------------------------------------------------------- Wound Assessment Details Patient Name: Date of Service: [name] [name] [name] 06/25/2023 1:00 PM Medical Record Number: [id number] Patient Account Number: [id number] Date of Birth/Sex: Treating RN: [birth year] (62 y.o. [name] [name] Primary Care Stepahnie Campo: [name] [name]: Referring Katarina Riebe: Treating Gerhart Ruggieri/Extender: [name] [name] in Treatment: 48 Wound Status Wound Number: 25 Primary Lesion Etiology: Wound Location: Left Abdomen - Lower Quadrant Wound Open Wounding Event: Gradually Appeared Status: Date Acquired: 01/25/2023 Comorbid Cataracts, Lymphedema, Asthma, Congestive Heart Failure, Weeks Of Treatment: 21 History: Coronary Artery Disease, Hypertension, Peripheral Arterial Disease, Clustered Wound: Yes Peripheral Venous Disease, Type II Diabetes, Rheumatoid Arthritis, Neuropathy Photos Wound Measurements Length: (cm) Width: (cm) Depth:  (cm) Clustered Quantity: Area: (cm) Volume: (cm) 7 % Reduction in Area: 32.5% 2.7 % Reduction in Volume: 32.5% 0.1 Epithelialization: Small (1-33%) 5 Tunneling: No 14.844 Undermining: No 1.484 Wound Description Classification: Full Thickness Without Exposed Sup Wound Margin: Distinct, outline attached Exudate Amount: Medium Exudate Type: Serosanguineous Exudate Color: red, brown port Structures Foul Odor After Cleansing: No Slough/Fibrino Yes Wound Bed Granulation Amount: Medium (34-66%) Exposed Structure Granulation Quality: Red, Pink Fascia Exposed: No Necrotic Amount: Medium (34-66%) Fat Layer (Subcutaneous Tissue) Exposed: Yes Necrotic Quality: Eschar, Adherent Slough Tendon Exposed: No Muscle Exposed: No Joint Exposed: No Bone Exposed: No Periwound Skin Texture Texture Color No Abnormalities Noted: Yes No Abnormalities Noted: Yes Moisture Temperature / Pain No Abnormalities Noted: Yes Temperature: No Abnormality Treatment Notes Wound #25 (Abdomen - Lower Quadrant) Wound Laterality: Left [name], Sharyon [name] ([id number]) [id number] Page 18 of 20 Cleanser Soap and Water Discharge Instruction: May shower and wash wound with dial antibacterial soap and water prior to dressing change. Wound Cleanser Discharge Instruction: Cleanse the wound with wound cleanser prior to applying a clean dressing using gauze sponges, not tissue or cotton balls. Peri-Wound Care Topical Gentamicin Discharge Instruction: As directed by physician Mupirocin Ointment Discharge Instruction: Apply Mupirocin (Bactroban) as instructed Primary Dressing Maxorb Extra Ag+ Alginate Dressing, 4x4.75 (in/in) Discharge Instruction: Apply to wound bed as instructed Secondary Dressing ABD Pad, 5x9 Discharge Instruction: Apply over primary dressing as directed. Bordered Gauze, 4x4 in Discharge Instruction: Apply over primary dressing as directed. Woven Gauze Sponge, Non-Sterile  4x4 in Discharge Instruction: Apply over primary dressing as directed. Secured With [employer] T 2x10 (in/yd) ape Discharge Instruction: Secure with tape as directed. Compression Wrap Compression Stockings Add-Ons Electronic Signature(s) Signed: 06/25/2023 4:30:49 PM By: Karie Schwalbe RN Entered By: Karie Schwalbe on 06/25/2023 10:40:32 -------------------------------------------------------------------------------- Wound Assessment Details Patient Name: Date of Service: [name] [name] [name] 06/25/2023 1:00 PM Medical Record Number: [id number] Patient Account Number: [id number] Date of Birth/Sex: Treating RN: Nov 04, 1960 (62 y.o. [name] [name] Primary Care Jasia Hiltunen: [name] [name]: Referring Ediel Unangst: Treating Odarius Dines/Extender: [name] [name] in Treatment: 48 Wound Status Wound Number: 27 Primary Lesion Etiology: Wound Location: Left Breast Wound Open Wounding Event: Gradually Appeared Status: Date Acquired: 01/25/2023 Comorbid Cataracts, Lymphedema, Asthma, Congestive Heart Failure, Weeks Of Treatment: 21 History: Coronary Artery Disease, Hypertension, Peripheral Arterial Disease, Clustered Wound: Yes Peripheral Venous Disease, Type II Diabetes, Rheumatoid Arthritis, Neuropathy Photos Adolf, Caree [name] ([id number]) [id number] Page 19 of 20 Wound Measurements Length: (cm) 8 Width: (cm) 1.8  Samaras, Jessika [name] ([id number]) [id number] Page 1 of 20 Visit Report for 06/25/2023 Arrival Information Details Patient Name: Date of Service: Stegmann, [name] [name]. 06/25/2023 1:00 PM Medical Record Number: [id number] Patient Account Number: [id number] Date of Birth/Sex: Treating RN: September 04, 1961 (62 y.o. [name] [name] Primary Care Laurance Heide: [name] [name]: Referring Koleton Duchemin: Treating Robbyn Hodkinson/Extender: [name] [name] in Treatment: 48 Visit Information History Since Last Visit Added or deleted any medications: No Patient Arrived: Wheel Chair Any new allergies or adverse reactions: No Arrival Time: 13:01 Had a fall or experienced change in No Accompanied By: spouse activities of daily living that may affect Transfer Assistance: Manual risk of falls: Patient Identification Verified: Yes Signs or symptoms of abuse/neglect since last visito No Patient Requires Transmission-Based Precautions: No Hospitalized since last visit: No Patient Has Alerts: No Implantable device outside of the clinic excluding No cellular tissue based products placed in the center since last visit: Pain Present Now: Yes Electronic Signature(s) Signed: 06/25/2023 4:30:49 PM By: Karie Schwalbe RN Entered By: Karie Schwalbe on 06/25/2023 10:02:31 -------------------------------------------------------------------------------- Encounter Discharge Information Details Patient Name: Date of Service: Wollenberg, [name] [name] 06/25/2023 1:00 PM Medical Record Number: [id number] Patient Account Number: [id number] Date of Birth/Sex: Treating RN: Apr 29, 1961 (62 y.o. [name] [name] Primary Care Armanda Forand: [name] [name]: Referring Ivon Roedel: Treating Raphael Fitzpatrick/Extender: [name] [name] in Treatment: 48 Encounter Discharge Information Items Post Procedure Vitals Discharge Condition: Stable Temperature  (F): 98.5 Ambulatory Status: Wheelchair Pulse (bpm): 75 Discharge Destination: Home Respiratory Rate (breaths/min): 20 Transportation: Private Auto Blood Pressure (mmHg): 115/68 Accompanied By: spouse Schedule Follow-up Appointment: Yes Clinical Summary of Care: Patient Declined Electronic Signature(s) Signed: 06/25/2023 4:30:49 PM By: Karie Schwalbe RN Entered By: Karie Schwalbe on 06/25/2023 13:28:20 Ewalt, Aylene [name] ([id number]) [id number] Page 2 of 20 -------------------------------------------------------------------------------- Lower Extremity Assessment Details Patient Name: Date of Service: Etheridge, [name] [name] 06/25/2023 1:00 PM Medical Record Number: [id number] Patient Account Number: [id number] Date of Birth/Sex: Treating RN: 09/12/1960 (62 y.o. [name] [name] Primary Care Kiyani Jernigan: [name] [name]: Referring Koston Hennes: Treating Latressa Harries/Extender: [name] [name] in Treatment: 48 Edema Assessment Assessed: [Left: No] [Right: No] Edema: [Left: Ye] [Right: s] Calf Left: Right: Point of Measurement: 30 cm From Medial Instep 48 cm Ankle Left: Right: Point of Measurement: 9 cm From Medial Instep 28 cm Vascular Assessment Pulses: Dorsalis Pedis Palpable: [Left:Yes] Extremity colors, hair growth, and conditions: Extremity Color: [Left:Normal] Electronic Signature(s) Signed: 06/25/2023 4:30:49 PM By: Karie Schwalbe RN Entered By: Karie Schwalbe on 06/25/2023 10:17:45 -------------------------------------------------------------------------------- Multi Wound Chart Details Patient Name: Date of Service: Kincer, [name] [name] 06/25/2023 1:00 PM Medical Record Number: [id number] Patient Account Number: [id number] Date of Birth/Sex: Treating RN: 07-16-1961 (62 y.o. F) Primary Care Yamileth Hayse: [name] [name]: Referring Jamita Mckelvin: Treating Akaash Vandewater/Extender: [name] [name] in Treatment: 48 Vital Signs Height(in): 62 Pulse(bpm): Weight(lbs): 284 Blood Pressure(mmHg): Body Mass Index(BMI): 51.9 Temperature(F): 98.5 Respiratory Rate(breaths/min): 20 [19:Photos:] [id number] Page 3 of 20] Left, Plantar Foot Right Amputation Site - Above Knee Medial Pubis Wound Location: Gradually Appeared Pressure Injury Pressure Injury Wounding Event: Diabetic Wound/Ulcer of the Lower Pressure Ulcer Pressure Ulcer Primary Etiology: Extremity Cataracts, Lymphedema, Asthma, Cataracts, Lymphedema, Asthma, Cataracts, Lymphedema, Asthma, Comorbid History: Congestive Heart Failure, Coronary Congestive Heart Failure, Coronary Congestive Heart Failure, Coronary Artery Disease, Hypertension, Artery Disease, Hypertension, Artery Disease, Hypertension, Peripheral Arterial Disease, Peripheral Peripheral Arterial Disease, Peripheral Peripheral Arterial Disease, Peripheral Venous Disease, Type II Diabetes, Venous Disease, Type  Texture Color No Abnormalities Noted: Yes No Abnormalities Noted: Yes Moisture Temperature / Pain No Abnormalities Noted: Yes Temperature: No Abnormality Electronic Signature(s) Signed: 06/25/2023 4:30:49 PM By: Karie Schwalbe RN Entered By: Karie Schwalbe on 06/25/2023 10:38:15 [name], Syenna [name] ([id number]) [id number] Page 17 of 20 -------------------------------------------------------------------------------- Wound Assessment Details Patient Name: Date of Service: [name] [name] [name] 06/25/2023 1:00 PM Medical Record Number: [id number] Patient Account Number: [id number] Date of Birth/Sex: Treating RN: [birth year] (62 y.o. [name] [name] Primary Care Stepahnie Campo: [name] [name]: Referring Katarina Riebe: Treating Gerhart Ruggieri/Extender: [name] [name] in Treatment: 48 Wound Status Wound Number: 25 Primary Lesion Etiology: Wound Location: Left Abdomen - Lower Quadrant Wound Open Wounding Event: Gradually Appeared Status: Date Acquired: 01/25/2023 Comorbid Cataracts, Lymphedema, Asthma, Congestive Heart Failure, Weeks Of Treatment: 21 History: Coronary Artery Disease, Hypertension, Peripheral Arterial Disease, Clustered Wound: Yes Peripheral Venous Disease, Type II Diabetes, Rheumatoid Arthritis, Neuropathy Photos Wound Measurements Length: (cm) Width: (cm) Depth:  (cm) Clustered Quantity: Area: (cm) Volume: (cm) 7 % Reduction in Area: 32.5% 2.7 % Reduction in Volume: 32.5% 0.1 Epithelialization: Small (1-33%) 5 Tunneling: No 14.844 Undermining: No 1.484 Wound Description Classification: Full Thickness Without Exposed Sup Wound Margin: Distinct, outline attached Exudate Amount: Medium Exudate Type: Serosanguineous Exudate Color: red, brown port Structures Foul Odor After Cleansing: No Slough/Fibrino Yes Wound Bed Granulation Amount: Medium (34-66%) Exposed Structure Granulation Quality: Red, Pink Fascia Exposed: No Necrotic Amount: Medium (34-66%) Fat Layer (Subcutaneous Tissue) Exposed: Yes Necrotic Quality: Eschar, Adherent Slough Tendon Exposed: No Muscle Exposed: No Joint Exposed: No Bone Exposed: No Periwound Skin Texture Texture Color No Abnormalities Noted: Yes No Abnormalities Noted: Yes Moisture Temperature / Pain No Abnormalities Noted: Yes Temperature: No Abnormality Treatment Notes Wound #25 (Abdomen - Lower Quadrant) Wound Laterality: Left [name], Sharyon [name] ([id number]) [id number] Page 18 of 20 Cleanser Soap and Water Discharge Instruction: May shower and wash wound with dial antibacterial soap and water prior to dressing change. Wound Cleanser Discharge Instruction: Cleanse the wound with wound cleanser prior to applying a clean dressing using gauze sponges, not tissue or cotton balls. Peri-Wound Care Topical Gentamicin Discharge Instruction: As directed by physician Mupirocin Ointment Discharge Instruction: Apply Mupirocin (Bactroban) as instructed Primary Dressing Maxorb Extra Ag+ Alginate Dressing, 4x4.75 (in/in) Discharge Instruction: Apply to wound bed as instructed Secondary Dressing ABD Pad, 5x9 Discharge Instruction: Apply over primary dressing as directed. Bordered Gauze, 4x4 in Discharge Instruction: Apply over primary dressing as directed. Woven Gauze Sponge, Non-Sterile  4x4 in Discharge Instruction: Apply over primary dressing as directed. Secured With [employer] T 2x10 (in/yd) ape Discharge Instruction: Secure with tape as directed. Compression Wrap Compression Stockings Add-Ons Electronic Signature(s) Signed: 06/25/2023 4:30:49 PM By: Karie Schwalbe RN Entered By: Karie Schwalbe on 06/25/2023 10:40:32 -------------------------------------------------------------------------------- Wound Assessment Details Patient Name: Date of Service: [name] [name] [name] 06/25/2023 1:00 PM Medical Record Number: [id number] Patient Account Number: [id number] Date of Birth/Sex: Treating RN: Nov 04, 1960 (62 y.o. [name] [name] Primary Care Jasia Hiltunen: [name] [name]: Referring Ediel Unangst: Treating Odarius Dines/Extender: [name] [name] in Treatment: 48 Wound Status Wound Number: 27 Primary Lesion Etiology: Wound Location: Left Breast Wound Open Wounding Event: Gradually Appeared Status: Date Acquired: 01/25/2023 Comorbid Cataracts, Lymphedema, Asthma, Congestive Heart Failure, Weeks Of Treatment: 21 History: Coronary Artery Disease, Hypertension, Peripheral Arterial Disease, Clustered Wound: Yes Peripheral Venous Disease, Type II Diabetes, Rheumatoid Arthritis, Neuropathy Photos Adolf, Caree [name] ([id number]) [id number] Page 19 of 20 Wound Measurements Length: (cm) 8 Width: (cm) 1.8  Karie Schwalbe RN Perezperez, Delanda [name] ([id number]) [id number] Page 8 of 20 Entered By: Karie Schwalbe on 06/25/2023 13:26:32 -------------------------------------------------------------------------------- Wound Assessment Details Patient Name: Date of Service: Ogando, [name] [name] 06/25/2023 1:00 PM Medical Record Number: [id number] Patient Account Number: [id number] Date of Birth/Sex: Treating RN: [birth year] (62 y.o. [name] [name] Primary Care Anquan Azzarello: [name] [name]: Referring Lakeshia Dohner: Treating Kevyn Wengert/Extender: [name] [name] in Treatment: 48 Wound Status Wound Number: 19 Primary Diabetic Wound/Ulcer of the Lower  Extremity Etiology: Wound Location: Left, Plantar Foot Wound Open Wounding Event: Gradually Appeared Status: Date Acquired: 05/17/2022 Comorbid Cataracts, Lymphedema, Asthma, Congestive Heart Failure, Weeks Of Treatment: 48 History: Coronary Artery Disease, Hypertension, Peripheral Arterial Disease, Clustered Wound: Yes Peripheral Venous Disease, Type II Diabetes, Rheumatoid Arthritis, Neuropathy Photos Wound Measurements Length: (cm) Width: (cm) Depth: (cm) Clustered Quantity: Area: (cm) Volume: (cm) 12.3 % Reduction in Area: -63.4% 3.8 % Reduction in Volume: 18.3% 0.2 Epithelialization: None 2 Tunneling: No 36.71 Undermining: No 7.342 Wound Description Classification: Grade 2 Wound Margin: Distinct, outline attached Exudate Amount: Medium Exudate Type: Serosanguineous Exudate Color: red, brown Foul Odor After Cleansing: No Slough/Fibrino Yes Wound Bed Granulation Amount: Large (67-100%) Exposed Structure Granulation Quality: Red Fascia Exposed: No Necrotic Amount: Small (1-33%) Fat Layer (Subcutaneous Tissue) Exposed: Yes Necrotic Quality: Eschar, Adherent Slough Tendon Exposed: No Muscle Exposed: No Joint Exposed: No Bone Exposed: No Periwound Skin Texture Texture Color No Abnormalities Noted: No No Abnormalities Noted: No Callus: Yes Atrophie Blanche: No Crepitus: No Cyanosis: No Excoriation: No Ecchymosis: No Induration: No Erythema: No Rash: No Hemosiderin Staining: No [place], Chasya [name] ([id number]) [id number] Page 9 of 20 Scarring: Yes Mottled: No Pallor: No Moisture Rubor: No No Abnormalities Noted: Yes Temperature / Pain Temperature: No Abnormality Treatment Notes Wound #19 (Foot) Wound Laterality: Plantar, Left Cleanser Soap and Water Discharge Instruction: May shower and wash wound with dial antibacterial soap and water prior to dressing change. Wound Cleanser Discharge Instruction: Cleanse the wound with wound  cleanser prior to applying a clean dressing using gauze sponges, not tissue or cotton balls. Peri-Wound Care Topical Gentamicin Discharge Instruction: As directed by physician Mupirocin Ointment Discharge Instruction: Apply Mupirocin (Bactroban) as instructed Primary Dressing Maxorb Extra Ag+ Alginate Dressing, 4x4.75 (in/in) Discharge Instruction: Apply to wound bed as instructed Secondary Dressing ABD Pad, 5x9 Discharge Instruction: Apply over primary dressing as directed. Woven Gauze Sponge, Non-Sterile 4x4 in Discharge Instruction: Apply over primary dressing as directed. Secured With Elastic Bandage 4 inch (ACE bandage) Discharge Instruction: Secure with ACE bandage as directed. Kerlix Roll Sterile, 4.5x3.1 (in/yd) Discharge Instruction: Secure with Kerlix as directed. 42M Medipore Soft Cloth Surgical T 2x10 (in/yd) ape Discharge Instruction: Secure with tape as directed. Compression Wrap Compression Stockings Add-Ons Electronic Signature(s) Signed: 06/25/2023 4:30:49 PM By: Karie Schwalbe RN Entered By: Karie Schwalbe on 06/25/2023 10:25:41 -------------------------------------------------------------------------------- Wound Assessment Details Patient Name: Date of Service: Murchison, [name] [name] 06/25/2023 1:00 PM Medical Record Number: [id number] Patient Account Number: [id number] Date of Birth/Sex: Treating RN: 18-Mar-1961 (62 y.o. 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 Peri-Wound Care Topical Gentamicin Discharge Instruction: As directed by physician Mupirocin Ointment Discharge Instruction: Apply Mupirocin (Bactroban) as instructed Primary Dressing Maxorb Extra Ag+ Alginate Dressing, 4x4.75 (in/in) Discharge Instruction: Apply to wound bed as instructed Secondary Dressing ABD Pad, 5x9 Discharge Instruction: Apply over primary dressing as directed. Bordered Gauze, 4x4 in Discharge Instruction: Apply over primary dressing as directed. Woven Gauze Sponge, Non-Sterile 4x4 in Discharge Instruction: Apply over primary dressing as directed. Secured With [employer] T 2x10 (in/yd) ape Discharge Instruction: Secure with tape as directed. Compression Wrap Compression Stockings Add-Ons Electronic Signature(s) Signed: 06/25/2023 4:30:49 PM By: Karie Schwalbe RN Entered By: Karie Schwalbe on 06/25/2023 10:31:52 -------------------------------------------------------------------------------- Wound Assessment Details Patient Name: Date of  Service: [name] [name] [name] 06/25/2023 1:00 PM Medical Record Number: [id number] Patient Account Number: [id number] [name] [name] [name] ([id number]) [id number] Page 13 of 20 Date of Birth/Sex: Treating RN: 1960-12-03 (62 y.o. [name] [name] Primary Care [name] [name]: Other [name]: [name] Referring [name] [name]: Treating [name] [name]/Extender: [name] [name] in Treatment: 48 Wound Status Wound Number: 22 Primary Pressure Ulcer Etiology: Wound Location: Left, Medial Upper Leg Wound Open Wounding Event: Pressure Injury Status: Date Acquired: 12/14/2022 Comorbid Cataracts, Lymphedema, Asthma, Congestive Heart Failure, Weeks Of Treatment: 25 History: Coronary Artery Disease, Hypertension, Peripheral Arterial Disease, Clustered Wound: Yes Peripheral Venous Disease, Type II Diabetes, Rheumatoid Arthritis, Neuropathy Photos Wound Measurements Length: (cm) Width: (cm) Depth: (cm) Clustered Quantity: Area: (cm) Volume: (cm) 2 % Reduction in Area: 95.2% 0.6 % Reduction in Volume: 95.2% 0.1 Epithelialization: None 5 Tunneling: No 0.942 Undermining: No 0.094 Wound Description Classification: Category/Stage II Exudate Amount: Medium Exudate Type: Serosanguineous Exudate Color: red, brown Foul Odor After Cleansing: No Slough/Fibrino Yes Wound Bed Granulation Amount: Large (67-100%) Exposed Structure Granulation Quality: Red Fascia Exposed: No Necrotic Amount: Small (1-33%) Fat Layer (Subcutaneous Tissue) Exposed: Yes Necrotic Quality: Adherent Slough Tendon Exposed: No Muscle Exposed: No Joint Exposed: No Bone Exposed: No Periwound Skin Texture Texture Color No Abnormalities Noted: No No Abnormalities Noted: Yes Scarring: Yes Temperature / Pain Temperature: No Abnormality Moisture No Abnormalities Noted: Yes Treatment Notes Wound #22 (Upper Leg) Wound Laterality: Left, Medial Cleanser Soap and Water Discharge  Instruction: May shower and wash wound with dial antibacterial soap and water prior to dressing change. Wound Cleanser Discharge Instruction: Cleanse the wound with wound cleanser prior to applying a clean dressing using gauze sponges, not tissue or cotton balls. Peri-Wound Care Topical Gentamicin Discharge Instruction: As directed by physician [name] [name] [name] ([id number]) [id number] Page 14 of 20 Mupirocin Ointment Discharge Instruction: Apply Mupirocin (Bactroban) as instructed Primary Dressing Maxorb Extra Ag+ Alginate Dressing, 4x4.75 (in/in) Discharge Instruction: Apply to wound bed as instructed Secondary Dressing ABD Pad, 5x9 Discharge Instruction: Apply over primary dressing as directed. Woven Gauze Sponge, Non-Sterile 4x4 in Discharge Instruction: Apply over primary dressing as directed. Secured With Elastic Bandage 4 inch (ACE bandage) Discharge Instruction: Secure with ACE bandage as directed. Kerlix Roll Sterile, 4.5x3.1 (in/yd) Discharge Instruction: Secure with Kerlix as directed. 60M Medipore Soft Cloth Surgical T 2x10 (in/yd) ape Discharge Instruction: Secure with tape as directed. Compression Wrap Compression Stockings Add-Ons Electronic Signature(s) Signed: 06/25/2023 4:30:49 PM By: Karie Schwalbe RN Entered By: Karie Schwalbe on 06/25/2023 10:34:45 -------------------------------------------------------------------------------- Wound Assessment Details Patient Name: Date of Service: [name] [name] [name] 06/25/2023 1:00 PM Medical Record Number: [id number] Patient Account Number: [id number] Date of Birth/Sex: Treating RN: [birth year] (62 y.o. [name] [name] Primary Care Fatmata Legere:  Texture Color No Abnormalities Noted: Yes No Abnormalities Noted: Yes Moisture Temperature / Pain No Abnormalities Noted: Yes Temperature: No Abnormality Electronic Signature(s) Signed: 06/25/2023 4:30:49 PM By: Karie Schwalbe RN Entered By: Karie Schwalbe on 06/25/2023 10:38:15 [name], Syenna [name] ([id number]) [id number] Page 17 of 20 -------------------------------------------------------------------------------- Wound Assessment Details Patient Name: Date of Service: [name] [name] [name] 06/25/2023 1:00 PM Medical Record Number: [id number] Patient Account Number: [id number] Date of Birth/Sex: Treating RN: [birth year] (62 y.o. [name] [name] Primary Care Stepahnie Campo: [name] [name]: Referring Katarina Riebe: Treating Gerhart Ruggieri/Extender: [name] [name] in Treatment: 48 Wound Status Wound Number: 25 Primary Lesion Etiology: Wound Location: Left Abdomen - Lower Quadrant Wound Open Wounding Event: Gradually Appeared Status: Date Acquired: 01/25/2023 Comorbid Cataracts, Lymphedema, Asthma, Congestive Heart Failure, Weeks Of Treatment: 21 History: Coronary Artery Disease, Hypertension, Peripheral Arterial Disease, Clustered Wound: Yes Peripheral Venous Disease, Type II Diabetes, Rheumatoid Arthritis, Neuropathy Photos Wound Measurements Length: (cm) Width: (cm) Depth:  (cm) Clustered Quantity: Area: (cm) Volume: (cm) 7 % Reduction in Area: 32.5% 2.7 % Reduction in Volume: 32.5% 0.1 Epithelialization: Small (1-33%) 5 Tunneling: No 14.844 Undermining: No 1.484 Wound Description Classification: Full Thickness Without Exposed Sup Wound Margin: Distinct, outline attached Exudate Amount: Medium Exudate Type: Serosanguineous Exudate Color: red, brown port Structures Foul Odor After Cleansing: No Slough/Fibrino Yes Wound Bed Granulation Amount: Medium (34-66%) Exposed Structure Granulation Quality: Red, Pink Fascia Exposed: No Necrotic Amount: Medium (34-66%) Fat Layer (Subcutaneous Tissue) Exposed: Yes Necrotic Quality: Eschar, Adherent Slough Tendon Exposed: No Muscle Exposed: No Joint Exposed: No Bone Exposed: No Periwound Skin Texture Texture Color No Abnormalities Noted: Yes No Abnormalities Noted: Yes Moisture Temperature / Pain No Abnormalities Noted: Yes Temperature: No Abnormality Treatment Notes Wound #25 (Abdomen - Lower Quadrant) Wound Laterality: Left [name], Sharyon [name] ([id number]) [id number] Page 18 of 20 Cleanser Soap and Water Discharge Instruction: May shower and wash wound with dial antibacterial soap and water prior to dressing change. Wound Cleanser Discharge Instruction: Cleanse the wound with wound cleanser prior to applying a clean dressing using gauze sponges, not tissue or cotton balls. Peri-Wound Care Topical Gentamicin Discharge Instruction: As directed by physician Mupirocin Ointment Discharge Instruction: Apply Mupirocin (Bactroban) as instructed Primary Dressing Maxorb Extra Ag+ Alginate Dressing, 4x4.75 (in/in) Discharge Instruction: Apply to wound bed as instructed Secondary Dressing ABD Pad, 5x9 Discharge Instruction: Apply over primary dressing as directed. Bordered Gauze, 4x4 in Discharge Instruction: Apply over primary dressing as directed. Woven Gauze Sponge, Non-Sterile  4x4 in Discharge Instruction: Apply over primary dressing as directed. Secured With [employer] T 2x10 (in/yd) ape Discharge Instruction: Secure with tape as directed. Compression Wrap Compression Stockings Add-Ons Electronic Signature(s) Signed: 06/25/2023 4:30:49 PM By: Karie Schwalbe RN Entered By: Karie Schwalbe on 06/25/2023 10:40:32 -------------------------------------------------------------------------------- Wound Assessment Details Patient Name: Date of Service: [name] [name] [name] 06/25/2023 1:00 PM Medical Record Number: [id number] Patient Account Number: [id number] Date of Birth/Sex: Treating RN: Nov 04, 1960 (62 y.o. [name] [name] Primary Care Jasia Hiltunen: [name] [name]: Referring Ediel Unangst: Treating Odarius Dines/Extender: [name] [name] in Treatment: 48 Wound Status Wound Number: 27 Primary Lesion Etiology: Wound Location: Left Breast Wound Open Wounding Event: Gradually Appeared Status: Date Acquired: 01/25/2023 Comorbid Cataracts, Lymphedema, Asthma, Congestive Heart Failure, Weeks Of Treatment: 21 History: Coronary Artery Disease, Hypertension, Peripheral Arterial Disease, Clustered Wound: Yes Peripheral Venous Disease, Type II Diabetes, Rheumatoid Arthritis, Neuropathy Photos Adolf, Caree [name] ([id number]) [id number] Page 19 of 20 Wound Measurements Length: (cm) 8 Width: (cm) 1.8  [name] [name]: Referring Adreena Willits: Treating Adaley Kiene/Extender: [name] [name] in Treatment: 48 Wound Status Wound Number: 23 Primary Abrasion Etiology: Wound Location: Midline Umbilicus Wound  Open Wounding Event: Other Lesion Status: Date Acquired: 12/14/2022 Comorbid Cataracts, Lymphedema, Asthma, Congestive Heart Failure, Weeks Of Treatment: 25 History: Coronary Artery Disease, Hypertension, Peripheral Arterial Disease, Clustered Wound: Yes Peripheral Venous Disease, Type II Diabetes, Rheumatoid Arthritis, Neuropathy Photos Wound Measurements Length: (cm) 4.5 Chriswell, Dorcus [name] ([id number]) Width: (cm) 2.2 Depth: (cm) 0.1 Area: (cm) 7.775 Volume: (cm) 0.778 % Reduction in Area: 83.5% [id number] Page 15 of 20 % Reduction in Volume: 83.5% Epithelialization: Large (67-100%) Tunneling: No Undermining: No Wound Description Classification: Full Thickness Without Exposed Support Structures Exudate Amount: Medium Exudate Type: Serosanguineous Exudate Color: red, brown Foul Odor After Cleansing: No Slough/Fibrino Yes Wound Bed Granulation Amount: Medium (34-66%) Exposed Structure Granulation Quality: Pink, Hyper-granulation, Friable Fascia Exposed: No Necrotic Amount: Medium (34-66%) Fat Layer (Subcutaneous Tissue) Exposed: Yes Necrotic Quality: Eschar, Adherent Slough Tendon Exposed: No Muscle Exposed: No Joint Exposed: No Bone Exposed: No Periwound Skin Texture Texture Color No Abnormalities Noted: Yes No Abnormalities Noted: Yes Moisture Temperature / Pain No Abnormalities Noted: Yes Temperature: No Abnormality Treatment Notes Wound #23 (Umbilicus) Wound Laterality: Midline Cleanser Soap and Water Discharge Instruction: May shower and wash wound with dial antibacterial soap and water prior to dressing change. Wound Cleanser Discharge Instruction: Cleanse the wound with wound cleanser prior to applying a clean dressing using gauze sponges, not tissue or cotton balls. Peri-Wound Care Topical Gentamicin Discharge Instruction: As directed by physician Mupirocin Ointment Discharge Instruction: Apply Mupirocin (Bactroban) as  instructed Primary Dressing Maxorb Extra Ag+ Alginate Dressing, 4x4.75 (in/in) Discharge Instruction: Apply to wound bed as instructed Secondary Dressing ABD Pad, 5x9 Discharge Instruction: Apply over primary dressing as directed. Bordered Gauze, 4x4 in Discharge Instruction: Apply over primary dressing as directed. Woven Gauze Sponge, Non-Sterile 4x4 in Discharge Instruction: Apply over primary dressing as directed. Secured With [employer] T 2x10 (in/yd) ape Discharge Instruction: Secure with tape as directed. Compression Wrap Compression Stockings Add-Ons Electronic Signature(s) Signed: 06/25/2023 4:30:49 PM By: Karie Schwalbe RN Entered By: Karie Schwalbe on 06/25/2023 10:36:34 Mccaughey, Dorthie [name] ([id number]) [id number] Page 16 of 20 -------------------------------------------------------------------------------- Wound Assessment Details Patient Name: Date of Service: Kestenbaum, [name] [name] 06/25/2023 1:00 PM Medical Record Number: [id number] Patient Account Number: [id number] Date of Birth/Sex: Treating RN: Jan 28, 1961 (62 y.o. [name] [name] Primary Care Milli Woolridge: [name] [name]: Referring Dieter Hane: Treating Charlotte Fidalgo/Extender: [name] [name] in Treatment: 48 Wound Status Wound Number: 24 Primary Lesion Etiology: Wound Location: Abdomen - midline Wound Healed - Epithelialized Wounding Event: Other Lesion Status: Date Acquired: 12/14/2022 Notes: PANNUS Weeks Of Treatment: 25 Comorbid Cataracts, Lymphedema, Asthma, Congestive Heart Failure, Clustered Wound: Yes History: Coronary Artery Disease, Hypertension, Peripheral Arterial Disease, Peripheral Venous Disease, Type II Diabetes, Rheumatoid Arthritis, Neuropathy Photos Wound Measurements Length: (cm) Width: (cm) Depth: (cm) Clustered Quantity: Area: (cm) Volume: (cm) 0 % Reduction in Area: 100% 0 % Reduction in Volume:  100% 0 Epithelialization: Large (67-100%) 4 Tunneling: No 0 Undermining: No 0 Wound Description Classification: Full Thickness Without Exposed Sup Wound Margin: Distinct, outline attached Exudate Amount: None Present port Structures Foul Odor After Cleansing: No Slough/Fibrino No Wound Bed Granulation Amount: None Present (0%) Exposed Structure Necrotic Amount: None Present (0%) Fascia Exposed: No Fat Layer (Subcutaneous Tissue) Exposed: No Tendon Exposed: No Muscle Exposed: No Joint Exposed: No Bone Exposed: No Periwound Skin Texture

## 2023-06-27 ENCOUNTER — Other Ambulatory Visit: Payer: Self-pay | Admitting: Physical Medicine and Rehabilitation

## 2023-06-27 ENCOUNTER — Other Ambulatory Visit (HOSPITAL_BASED_OUTPATIENT_CLINIC_OR_DEPARTMENT_OTHER): Payer: Self-pay | Admitting: Cardiology

## 2023-06-27 ENCOUNTER — Other Ambulatory Visit: Payer: Self-pay | Admitting: Family Medicine

## 2023-06-27 DIAGNOSIS — L03116 Cellulitis of left lower limb: Secondary | ICD-10-CM

## 2023-06-27 DIAGNOSIS — E559 Vitamin D deficiency, unspecified: Secondary | ICD-10-CM

## 2023-06-27 DIAGNOSIS — J45909 Unspecified asthma, uncomplicated: Secondary | ICD-10-CM

## 2023-06-27 MED ORDER — FLUCONAZOLE 150 MG PO TABS: ORAL_TABLET | ORAL | 0 refills | Status: DC

## 2023-06-27 MED ORDER — ONETOUCH VERIO VI STRP: ORAL_STRIP | 3 refills | Status: DC

## 2023-06-27 MED ORDER — PRAMIPEXOLE DIHYDROCHLORIDE 1 MG PO TABS: 1.0000 mg | ORAL_TABLET | Freq: Three times a day (TID) | ORAL | 0 refills | Status: DC

## 2023-06-27 MED ORDER — DILT-XR 240 MG PO CP24: 240.0000 mg | ORAL_CAPSULE | Freq: Every day | ORAL | 0 refills | Status: DC

## 2023-06-27 MED ORDER — LEVOCETIRIZINE DIHYDROCHLORIDE 5 MG PO TABS: 5.0000 mg | ORAL_TABLET | Freq: Every evening | ORAL | 0 refills | Status: DC

## 2023-06-27 MED ORDER — HUMULIN R U-500 (CONCENTRATED) 500 UNIT/ML ~~LOC~~ SOLN: SUBCUTANEOUS | 0 refills | Status: DC

## 2023-06-27 MED ORDER — VITAMIN D (ERGOCALCIFEROL) 1.25 MG (50000 UNIT) PO CAPS: ORAL_CAPSULE | ORAL | 0 refills | Status: DC

## 2023-06-27 MED ORDER — LEVOTHYROXINE SODIUM 200 MCG PO TABS: 200.0000 ug | ORAL_TABLET | Freq: Every day | ORAL | 0 refills | Status: DC

## 2023-06-27 MED ORDER — DICLOFENAC SODIUM 2 % EX SOLN: 2.0000 | Freq: Two times a day (BID) | CUTANEOUS | 0 refills | Status: DC | PRN

## 2023-06-27 MED ORDER — METFORMIN HCL ER 500 MG PO TB24: 1000.0000 mg | ORAL_TABLET | Freq: Two times a day (BID) | ORAL | 0 refills | Status: DC

## 2023-06-28 MED ORDER — DICLOFENAC SODIUM 1 % EX GEL: 2.0000 g | Freq: Four times a day (QID) | CUTANEOUS | 0 refills | Status: DC

## 2023-07-06 ENCOUNTER — Telehealth (INDEPENDENT_AMBULATORY_CARE_PROVIDER_SITE_OTHER): Payer: Managed Care, Other (non HMO) | Admitting: Family Medicine

## 2023-07-06 ENCOUNTER — Encounter: Payer: Self-pay | Admitting: Family Medicine

## 2023-07-06 DIAGNOSIS — J4531 Mild persistent asthma with (acute) exacerbation: Secondary | ICD-10-CM | POA: Diagnosis not present

## 2023-07-06 DIAGNOSIS — L089 Local infection of the skin and subcutaneous tissue, unspecified: Secondary | ICD-10-CM

## 2023-07-06 MED ORDER — DOXYCYCLINE HYCLATE 100 MG PO TABS
100.0000 mg | ORAL_TABLET | Freq: Two times a day (BID) | ORAL | 0 refills | Status: DC
Start: 2023-07-06 — End: 2023-08-01

## 2023-07-06 MED ORDER — FLUCONAZOLE 150 MG PO TABS: ORAL_TABLET | ORAL | 0 refills | Status: DC

## 2023-07-06 MED ORDER — PREDNISONE 20 MG PO TABS
60.0000 mg | ORAL_TABLET | Freq: Every day | ORAL | 0 refills | Status: AC
Start: 2023-07-06 — End: 2023-07-11

## 2023-07-06 NOTE — Progress Notes (Signed)
Chief Complaint  Patient presents with   Cough    Coughing and congestion     [name] is 62 y.o. and is here for a cough. We are interacting via web portal for an electronic face-to-face visit. I verified patient's ID using 2 identifiers. Patient agreed to proceed with visit via this method. Patient is at home, I am at office. Patient and I are present for visit.   Duration: 3 months Productive? No Associated symptoms: dyspnea, wheezing (last couple of weeks) Denies: fever, nasal congestion, hemoptysis rhinorrhea, sore throat, facial pain Hx of GERD? Yes- does not feel like ACEi? No Started Imdur and started coughing. Cards increased the dosage and coughing worsened. She has not yet reached out to them.   Skin infection on R breast that is painful and getting bigger. Started several mo ago. No drainage or itching.   Past Medical History:  Diagnosis Date   Aortic stenosis    mild AS by echo 02/2021   Asthma    "on daily RX and rescue inhaler" (04/18/2018)   Chronic diastolic CHF (congestive heart failure) (HCC) 09/2015   Chronic lower back pain    Chronic neck pain    Chronic pain syndrome    Fentanyl Patch   Colon polyps    Coronary artery disease    cath with normal LM, 30% LAD, 85% mid RCA and 95% distal RCA s/p PCI of the mid to distal RCA and now on DAPT with ASA and Ticagrelor.     Eczema    Excessive daytime sleepiness 11/26/2015   Fibromyalgia    Gallstones    GERD (gastroesophageal reflux disease)    Heart murmur    "noted for the 1st time on 04/18/2018"   History of blood transfusion 07/2010   "S/P oophorectomy"   History of gout    History of hiatal hernia 1980s   "gone now" (04/18/2018)   Hyperlipidemia    Hypertension    takes Metoprolol and Enalapril daily   Hypothyroidism    takes Synthroid daily   IBS (irritable bowel syndrome)    Migraine    "nothing in the 2000s" (04/18/2018)   Mixed connective tissue disease (HCC)    NAFLD (nonalcoholic fatty liver  disease)    Pneumonia    "several times" (04/18/2018)   PVC's (premature ventricular contractions)    noted on event monitor 02/2021   Rheumatoid arthritis (HCC)    "hands, elbows, shoulders, probably knees" (04/18/2018)   Scoliosis    Sjogren's syndrome (HCC)    Sleep apnea    mild - does not use cpap   Spondylosis    Type II diabetes mellitus (HCC)    takes Metformin and Hum R daily (04/18/2018)   Walker as ambulation aid    also uses wheelchair   Family History  Problem Relation Age of Onset   CAD Mother    Hypertension Mother    Heart attack Mother    Stroke Mother    CAD Father    Heart attack Father    Allergic rhinitis Father    Asthma Father    Hypertension Brother    Hypertension Brother    Pancreatic cancer Paternal Aunt    Breast cancer Paternal Aunt    Lung cancer Paternal Aunt    Allergic rhinitis Son    Asthma Son    Allergies as of 07/06/2023       Reactions   Fish Allergy Anaphylaxis   INCLUDES OMEGA 3 OILS  Fish Oil Anaphylaxis, Swelling   THROAT SWELLS INCLUDES FISH AS A CLASS   Gabapentin Anxiety, Other (See Comments)   Anxiety on high doses Other Reaction(s): Angioedema Other reaction(s): Other (See Comments), Anxiety on high doses, Other reaction(s): Other (See Comments), Anxiety on high doses, Other reaction(s): Other (See Comments), Anxiety on high doses, Anxiety on high doses   Ipratropium-albuterol Shortness Of Breath   Other Reaction(s): Bronchospasm, Other (See Comments) Wheezing becomes worse, can take albuterol with ipratropium, Other reaction(s): Other (See Comments), Wheezing becomes worse, can take albuterol with ipratropium, wheezing, wheezing   Omega-3 Fatty Acids Swelling   Throat swelling  Has tolerated Glucerna nutritional drinks Throat swelling   Peanut-containing Drug Products Shortness Of Breath   Victoza [liraglutide] Nausea And Vomiting   Patient reported severe, debilitating nausea   Lovastatin Other (See Comments)    EXTREME JOINT PAIN   Metronidazole Nausea Only, Swelling   Headache and shakes Nausea and vomiting Pt tolerating IV 06/14/22 Other Reaction(s): Other (See Comments) Headache and shakes, Nausea and vomiting   Penicillins Other (See Comments), Rash   UNSPECIFIED, TOLERATES KEFLEX  CHILDHOOD RX PCN CHALLENGE PER ALLERGIST, 02/29/16.  RESULTS NOT UPDATED. Other Reaction(s): Other (See Comments) Other reaction(s): Other (See Comments), Unknown childhood reaction, Childhood allergy, Unknown childhood reaction; Tolerates Keflex.  , To undergo PCN challenge with allergist 02/2016   Red Yeast Rice [cholestin] Other (See Comments)   Muscle pain and severe joint pain.    Rosuvastatin Other (See Comments)   Extreme joint pain, to this & other statins Other Reaction(s): Other (See Comments) Other reaction(s): Other (See Comments), Extreme joint pain, Extreme joint pain, Extreme joint pain, Extreme joint pain, to this & other statins   Rotigotine Swelling   (Neupro) Extreme edema   Atrovent Hfa [ipratropium Bromide Hfa] Other (See Comments)   wheezing   Iodine Swelling   Ipratropium Bromide    Other Reaction(s): Bronchospasm, Other (See Comments) Other reaction(s): Other (See Comments), Wheezing becomes worse, wheezing, wheezing, Wheezing becomes worse   Morphine    Other Other (See Comments)   Surgical Staples causes redness/infection per patient.   Pepto-bismol [bismuth Subsalicylate] Nausea And Vomiting   Extreme vomiting   Wound Dressing Adhesive Other (See Comments)   Electrodes causes skin breakdown, Electrodes causes skin breakdown   Enalapril Cough   Suprep [na Sulfate-k Sulfate-mg Sulf] Nausea And Vomiting   Tape Other (See Comments)   Electrodes causes skin breakdown        Medication List        Accurate as of July 06, 2023  4:03 PM. If you have any questions, ask your nurse or doctor.          acetaminophen 500 MG tablet Commonly known as: TYLENOL Take 500 mg by  mouth every 8 (eight) hours as needed for mild pain.   albuterol (2.5 MG/3ML) 0.083% nebulizer solution Commonly known as: PROVENTIL USE 1 VIAL IN NEBULIZER EVERY 4 HOURS AS NEEDED FOR WHEEZING FOR SHORTNESS OF BREATH   albuterol 108 (90 Base) MCG/ACT inhaler Commonly known as: VENTOLIN HFA INHALE 2 PUFFS BY MOUTH EVERY 6 HOURS AS NEEDED FOR WHEEZING FOR SHORTNESS OF BREATH   ascorbic acid 500 MG tablet Commonly known as: VITAMIN C Take 1 tablet (500 mg total) by mouth 2 (two) times daily. What changed: when to take this   aspirin EC 81 MG tablet Take by mouth.   augmented betamethasone dipropionate 0.05 % cream Commonly known as: Diprolene AF Apply 1 Application topically 2 (  two) times daily as needed (rash).   Dexcom G7 Sensor Misc USE TO CHECK FOR BLOOD SUGAR CONTINUOUSLY- CHANGE SENSOR EVERY 10 DAYS   diclofenac Sodium 2 % Soln Commonly known as: PENNSAID Apply 2 Pump (40 mg total) topically 2 (two) times daily as needed.   diclofenac Sodium 1 % Gel Commonly known as: VOLTAREN Apply 2 g topically 4 (four) times daily.   Diclofenac Sodium 3 % Gel Apply 1 Application topically 4 (four) times daily as needed.   Dilt-XR 240 MG 24 hr capsule Generic drug: diltiazem Take 1 capsule (240 mg total) by mouth daily.   diphenhydrAMINE 25 mg in sodium chloride 0.9 % 50 mL Take by mouth.   doxycycline 100 MG tablet Commonly known as: VIBRA-TABS Take 1 tablet (100 mg total) by mouth 2 (two) times daily. Started by: [employer] Apply topically.   EPINEPHrine 0.3 mg/0.3 mL Soaj injection Commonly known as: EPI-PEN Inject 0.3 mg into the muscle as needed for anaphylaxis.   fluconazole 150 MG tablet Commonly known as: DIFLUCAN Take 1 tab, repeat in 72 hours if no improvement.   fluticasone 50 MCG/ACT nasal spray Commonly known as: FLONASE USE 2 SPRAY(S) IN EACH NOSTRIL ONCE DAILY AS NEEDED FOR ALLERGIES OR  RHINITIS    furosemide 20 MG tablet Commonly known as: LASIX Take 2 tablets (40 mg total) by mouth daily.   gabapentin 400 MG capsule Commonly known as: NEURONTIN Take 1 capsule (400 mg total) by mouth 4 (four) times daily. TAKE 1 CAPSULE BY MOUTH IN THE MORNING AND 1 AT NOON AND 2 AT BEDTIME Strength: 400 mg   Glucagon Emergency 1 MG Kit Inject 1 mg into the vein daily as needed.   GLUCOMANNAN PO Take 1,995 mg by mouth daily.   guaiFENesin 600 MG 12 hr tablet Commonly known as: MUCINEX Take 600 mg by mouth 2 (two) times daily as needed for cough or to loosen phlegm.   HUMULIN R 500 UNIT/ML injection Generic drug: insulin regular human CONCENTRATED INJECT 0.08-0.3 MLS (40-150 UNITS TOTAL) INTO THE SKIN THREE TIMES DAILY WITH MEALS   HYALURONIC ACID PO   Hydrocortisone Max St 1 % Generic drug: hydrocortisone cream Apply 1 application topically 3 (three) times daily as needed for itching (minor skin irritation).   INSULIN SYRINGE 1CC/29G 29G X 1/2" 1 ML Misc Commonly known as: B-D INSULIN SYRINGE Use three times daily with insulin.  DX E11.9   isosorbide mononitrate 60 MG 24 hr tablet Commonly known as: IMDUR Take 1 tablet (60 mg total) by mouth daily.   levocetirizine 5 MG tablet Commonly known as: XYZAL Take 1 tablet (5 mg total) by mouth every evening.   levothyroxine 200 MCG tablet Commonly known as: SYNTHROID Take 1 tablet (200 mcg total) by mouth daily before breakfast.   lidocaine 5 % ointment Commonly known as: XYLOCAINE Apply topically as directed.   lidocaine 5 % Commonly known as: LIDODERM Place 1 patch onto the skin every 12 (twelve) hours. Remove & Discard patch within 12 hours or as directed   metFORMIN 500 MG 24 hr tablet Commonly known as: GLUCOPHAGE-XR Take 2 tablets (1,000 mg total) by mouth 2 (two) times daily.   mupirocin ointment 2 % Commonly known as: BACTROBAN Apply topically 2 (two) times daily.   naloxone 4 MG/0.1ML Liqd nasal spray  kit Commonly known as: NARCAN Place 1 spray into the nose as needed (opoid overdose).   nitroGLYCERIN 0.4 MG SL tablet  Commonly known as: NITROSTAT DISSOLVE ONE TABLET UNDER THE TONGUE EVERY 5 MINUTES AS NEEDED FOR CHEST PAIN.  DO NOT EXCEED A TOTAL OF 3 DOSES IN 15 MINUTES What changed: See the new instructions.   nystatin powder Commonly known as: MYCOSTATIN/NYSTOP Apply 1 Application topically 3 (three) times daily. RLE What changed:  when to take this reasons to take this   omeprazole 40 MG capsule Commonly known as: PRILOSEC Take 1 capsule by mouth once daily   OneTouch Delica Lancets 33G Misc Use to check blood glucose up to 4 times a day   [employer] w/Device Kit Use to check blood sugar 4 times daily.  DX E11.65 and Z79.4   OneTouch Verio test strip Generic drug: glucose blood Use as instructed   Oxycodone HCl 20 MG Tabs Take 1 tablet (20 mg total) by mouth every 8 (eight) hours as needed.   oxymetazoline 0.05 % nasal spray Commonly known as: AFRIN Place 2 sprays into both nostrils 2 (two) times daily as needed for congestion.   pramipexole 1 MG tablet Commonly known as: MIRAPEX Take 1 tablet (1 mg total) by mouth 3 (three) times daily.   predniSONE 20 MG tablet Commonly known as: DELTASONE Take 3 tablets (60 mg total) by mouth daily with breakfast for 5 days. Started by: Sharlene Dory   pregabalin 50 MG capsule Commonly known as: LYRICA Take 1 capsule (50 mg total) by mouth 3 (three) times daily.   promethazine 25 MG tablet Commonly known as: PHENERGAN Take 1 tablet (25 mg total) by mouth every 8 (eight) hours as needed for vomiting or nausea.   saccharomyces boulardii 250 MG capsule Commonly known as: FLORASTOR Take 250 mg by mouth 3 (three) times daily.   Systane Preservative Free 0.4-0.3 % Soln Generic drug: Polyethyl Glyc-Propyl Glyc PF Apply to eye.   Vitamin D (Ergocalciferol) 1.25 MG (50000 UNIT) Caps capsule Commonly  known as: DRISDOL TAKE 1 CAPSULE BY MOUTH ONCE A WEEK ON  FRIDAYS   WOMENS 50+ MULTI VITAMIN PO Take 1 tablet by mouth daily.       Objective No conversational dyspnea Age appropriate judgment and insight Nml affect and mood  Mild persistent asthma with exacerbation - Plan: predniSONE (DELTASONE) 20 MG tablet  Skin infection - Plan: doxycycline (VIBRA-TABS) 100 MG tablet  Exacerbation of chronic issue. Pred burst 60 mg/d for 5 d. Send message if no improvement in the next 2-3 days. SABA prn.  Empirically tx w doxy 100 mg bid. If no improvement, seek care or let us know and we will evaluate for possible I&D.  F/u in 4 weeks if symptoms fail to improve. The patient voiced understanding and agreement to the plan.  Jilda Roche [place], DO 07/06/23 4:03 PM

## 2023-07-10 ENCOUNTER — Encounter: Payer: Managed Care, Other (non HMO) | Admitting: Registered Nurse

## 2023-07-10 ENCOUNTER — Encounter: Payer: Self-pay | Admitting: Registered Nurse

## 2023-07-10 VITALS — BP 127/64 | HR 90 | Ht 62.4 in

## 2023-07-10 DIAGNOSIS — Z79899 Other long term (current) drug therapy: Secondary | ICD-10-CM

## 2023-07-10 DIAGNOSIS — G894 Chronic pain syndrome: Secondary | ICD-10-CM

## 2023-07-10 DIAGNOSIS — Z89611 Acquired absence of right leg above knee: Secondary | ICD-10-CM | POA: Diagnosis present

## 2023-07-10 DIAGNOSIS — M2550 Pain in unspecified joint: Secondary | ICD-10-CM | POA: Diagnosis present

## 2023-07-10 DIAGNOSIS — M7062 Trochanteric bursitis, left hip: Secondary | ICD-10-CM | POA: Diagnosis present

## 2023-07-10 DIAGNOSIS — Z5181 Encounter for therapeutic drug level monitoring: Secondary | ICD-10-CM | POA: Diagnosis present

## 2023-07-10 DIAGNOSIS — M25511 Pain in right shoulder: Secondary | ICD-10-CM

## 2023-07-10 DIAGNOSIS — M7061 Trochanteric bursitis, right hip: Secondary | ICD-10-CM

## 2023-07-10 DIAGNOSIS — M542 Cervicalgia: Secondary | ICD-10-CM

## 2023-07-10 DIAGNOSIS — M545 Low back pain, unspecified: Secondary | ICD-10-CM | POA: Diagnosis present

## 2023-07-10 DIAGNOSIS — M25512 Pain in left shoulder: Secondary | ICD-10-CM | POA: Diagnosis present

## 2023-07-10 DIAGNOSIS — G8929 Other chronic pain: Secondary | ICD-10-CM

## 2023-07-10 MED ORDER — OXYCODONE HCL 20 MG PO TABS: 1.0000 | ORAL_TABLET | Freq: Three times a day (TID) | ORAL | 0 refills | Status: DC | PRN

## 2023-07-10 NOTE — Progress Notes (Unsigned)
Subjective:    Patient ID: [name], female    DOB: 08-26-1961, 62 y.o.   MRN: [id number]  HPI: [name] is a 62 y.o. female who returns for follow up appointment for chronic pain and medication refill. states *** pain is located in  ***. rates pain ***. current exercise regime is walking and performing stretching exercises.  Ms. [name] Morphine equivalent is *** MME.   Oral Swab was Performed today.     Pain Inventory Average Pain 7 Pain Right Now 6 My pain is sharp, burning, dull, stabbing, tingling, and aching  In the last 24 hours, has pain interfered with the following? General activity 10 Relation with others 9 Enjoyment of life 10 What TIME of day is your pain at its worst? evening and night Sleep (in general) Poor  Pain is worse with: walking, bending, sitting, inactivity, standing, and some activites Pain improves with: medication Relief from Meds: 8  Family History  Problem Relation Age of Onset   CAD Mother    Hypertension Mother    Heart attack Mother    Stroke Mother    CAD Father    Heart attack Father    Allergic rhinitis Father    Asthma Father    Hypertension Brother    Hypertension Brother    Pancreatic cancer Paternal Aunt    Breast cancer Paternal Aunt    Lung cancer Paternal Aunt    Allergic rhinitis Son    Asthma Son    Social History   Socioeconomic History   Marital status: Married    Spouse name: [name]   Number of children: 1   Years of education: 11   Highest education level: GED or equivalent  Occupational History    Comment: Not currrently working ( last worked 2004)  Tobacco Use   Smoking status: Former    Current packs/day: 0.00    Average packs/day: 1 pack/day for 19.4 years (19.4 ttl pk-yrs)    Types: Cigarettes    Start date: 34    Quit date: 02/11/2004    Years since quitting: 19.4    Passive exposure: Past   Smokeless tobacco: Never  Vaping Use   Vaping status: Never Used  Substance and Sexual  Activity   Alcohol use: Yes    Comment: RARE Wine   Drug use: Not Currently    Types: Marijuana    Comment: "only in my teens"   Sexual activity: Yes    Birth control/protection: Surgical    Comment: Hysterectomy  Other Topics Concern   Not on file  Social History Narrative   Not on file   Social Determinants of Health   Financial Resource Strain: Low Risk  (12/27/2021)   Overall Financial Resource Strain (CARDIA)    Difficulty of Paying Living Expenses: Not hard at all  Food Insecurity: No Food Insecurity (06/14/2022)   Hunger Vital Sign    Worried About Running Out of Food in the Last Year: Never true    Ran Out of Food in the Last Year: Never true  Transportation Needs: No Transportation Needs (06/14/2022)   PRAPARE - Administrator, Civil Service (Medical): No    Lack of Transportation (Non-Medical): No  Physical Activity: Inactive (08/25/2021)   Exercise Vital Sign    Days of Exercise per Week: 0 days    Minutes of Exercise per Session: 0 min  Stress: Stress Concern Present (08/25/2021)   [employer] of Occupational Health - Occupational Stress  Questionnaire    Feeling of Stress : Very much  Social Connections: Unknown (01/22/2022)   [facility]   Social Network    Social Network: Not on file   Past Surgical History:  Procedure Laterality Date   ABDOMINAL HYSTERECTOMY  06/2005   "w/right ovariy"   AMPUTATION Right 01/28/2020    RIGHT FOURTH AND FIFTH RAY AMPUTATION   AMPUTATION Right 01/28/2020   Procedure: RIGHT FOURTH AND FIFTH RAY AMPUTATION,;  Surgeon: [name], MD;  Location: [facility] OR;  Service: Orthopedics;  Laterality: Right;   AMPUTATION Right 06/01/2021   Procedure: RIGHT BELOW KNEE AMPUTATION;  Surgeon: [name], MD;  Location: [facility] OR;  Service: Orthopedics;  Laterality: Right;   AMPUTATION Right 07/22/2021   Procedure: RIGHT ABOVE KNEE AMPUTATION;  Surgeon: [name], MD;  Location: [facility] OR;   Service: Orthopedics;  Laterality: Right;   APPENDECTOMY     BREAST BIOPSY Bilateral    9 total (04/18/2018)   CORONARY ANGIOPLASTY WITH STENT PLACEMENT  10/01/2015   normal LM, 30% LAD, 85% mid RCA and 95% distal RCA s/p PCI of the mid to distal RCA and now on DAPT with ASA and Ticagrelor.     CORONARY STENT INTERVENTION Right 04/18/2018   Procedure: CORONARY STENT INTERVENTION;  Surgeon: [name], MD;  Location: [facility];  Service: Cardiovascular;  Laterality: Right;   DILATION AND CURETTAGE OF UTERUS     FRACTURE SURGERY     LAPAROSCOPIC CHOLECYSTECTOMY     LEFT HEART CATH AND CORONARY ANGIOGRAPHY N/A 04/18/2018   Procedure: LEFT HEART CATH AND CORONARY ANGIOGRAPHY;  Surgeon: [name], MD;  Location: [facility];  Service: Cardiovascular;  Laterality: N/A;   MUSCLE BIOPSY Left    "leg"   OOPHORECTOMY  07/2010   RADIOLOGY WITH ANESTHESIA N/A 05/25/2016   Procedure: RADIOLOGY WITH ANESTHESIA;  Surgeon: Medication Radiologist, MD;  Location: [facility] OR;  Service: Radiology;  Laterality: N/A;   STUMP REVISION Right 06/29/2021   Procedure: REVISION RIGHT BELOW KNEE AMPUTATION;  Surgeon: [name], MD;  Location: [facility] OR;  Service: Orthopedics;  Laterality: Right;   TEE WITHOUT CARDIOVERSION N/A 06/01/2021   Procedure: TRANSESOPHAGEAL ECHOCARDIOGRAM (TEE);  Surgeon: Sande Rives, MD;  Location: [facility] OR;  Service: Cardiovascular;  Laterality: N/A;   TONSILLECTOMY AND ADENOIDECTOMY     WRIST FRACTURE SURGERY Left    "crushed it"   Past Surgical History:  Procedure Laterality Date   ABDOMINAL HYSTERECTOMY  06/2005   "w/right ovariy"   AMPUTATION Right 01/28/2020    RIGHT FOURTH AND FIFTH RAY AMPUTATION   AMPUTATION Right 01/28/2020   Procedure: RIGHT FOURTH AND FIFTH RAY AMPUTATION,;  Surgeon: [name], MD;  Location: [facility] OR;  Service: Orthopedics;  Laterality: Right;   AMPUTATION Right 06/01/2021   Procedure: RIGHT BELOW KNEE AMPUTATION;   Surgeon: [name], MD;  Location: [facility] OR;  Service: Orthopedics;  Laterality: Right;   AMPUTATION Right 07/22/2021   Procedure: RIGHT ABOVE KNEE AMPUTATION;  Surgeon: [name], MD;  Location: [facility] OR;  Service: Orthopedics;  Laterality: Right;   APPENDECTOMY     BREAST BIOPSY Bilateral    9 total (04/18/2018)   CORONARY ANGIOPLASTY WITH STENT PLACEMENT  10/01/2015   normal LM, 30% LAD, 85% mid RCA and 95% distal RCA s/p PCI of the mid to distal RCA and now on DAPT with ASA and Ticagrelor.     CORONARY STENT INTERVENTION  Right 04/18/2018   Procedure: CORONARY STENT INTERVENTION;  Surgeon: [name], MD;  Location: [facility];  Service: Cardiovascular;  Laterality: Right;   DILATION AND CURETTAGE OF UTERUS     FRACTURE SURGERY     LAPAROSCOPIC CHOLECYSTECTOMY     LEFT HEART CATH AND CORONARY ANGIOGRAPHY N/A 04/18/2018   Procedure: LEFT HEART CATH AND CORONARY ANGIOGRAPHY;  Surgeon: [name], MD;  Location: [facility];  Service: Cardiovascular;  Laterality: N/A;   MUSCLE BIOPSY Left    "leg"   OOPHORECTOMY  07/2010   RADIOLOGY WITH ANESTHESIA N/A 05/25/2016   Procedure: RADIOLOGY WITH ANESTHESIA;  Surgeon: Medication Radiologist, MD;  Location: [facility] OR;  Service: Radiology;  Laterality: N/A;   STUMP REVISION Right 06/29/2021   Procedure: REVISION RIGHT BELOW KNEE AMPUTATION;  Surgeon: [name], MD;  Location: [facility] OR;  Service: Orthopedics;  Laterality: Right;   TEE WITHOUT CARDIOVERSION N/A 06/01/2021   Procedure: TRANSESOPHAGEAL ECHOCARDIOGRAM (TEE);  Surgeon: Sande Rives, MD;  Location: [facility] OR;  Service: Cardiovascular;  Laterality: N/A;   TONSILLECTOMY AND ADENOIDECTOMY     WRIST FRACTURE SURGERY Left    "crushed it"   Past Medical History:  Diagnosis Date   Aortic stenosis    mild AS by echo 02/2021   Asthma    "on daily RX and rescue inhaler" (04/18/2018)   Chronic diastolic CHF (congestive heart failure) (HCC) 09/2015    Chronic lower back pain    Chronic neck pain    Chronic pain syndrome    Fentanyl Patch   Colon polyps    Coronary artery disease    cath with normal LM, 30% LAD, 85% mid RCA and 95% distal RCA s/p PCI of the mid to distal RCA and now on DAPT with ASA and Ticagrelor.     Eczema    Excessive daytime sleepiness 11/26/2015   Fibromyalgia    Gallstones    GERD (gastroesophageal reflux disease)    Heart murmur    "noted for the 1st time on 04/18/2018"   History of blood transfusion 07/2010   "S/P oophorectomy"   History of gout    History of hiatal hernia 1980s   "gone now" (04/18/2018)   Hyperlipidemia    Hypertension    takes Metoprolol and Enalapril daily   Hypothyroidism    takes Synthroid daily   IBS (irritable bowel syndrome)    Migraine    "nothing in the 2000s" (04/18/2018)   Mixed connective tissue disease (HCC)    NAFLD (nonalcoholic fatty liver disease)    Pneumonia    "several times" (04/18/2018)   PVC's (premature ventricular contractions)    noted on event monitor 02/2021   Rheumatoid arthritis (HCC)    "hands, elbows, shoulders, probably knees" (04/18/2018)   Scoliosis    Sjogren's syndrome (HCC)    Sleep apnea    mild - does not use cpap   Spondylosis    Type II diabetes mellitus (HCC)    takes Metformin and Hum R daily (04/18/2018)   Walker as ambulation aid    also uses wheelchair   BP 127/64   Pulse 90   Ht 5' 2.4" (1.585 m)   SpO2 91%   BMI 52.72 kg/m   Opioid Risk Score:   Fall Risk Score:  `1  Depression screen [facility] 2/9     07/10/2023   11:08 AM 06/13/2023   10:36 AM 05/11/2023    9:02 AM 04/12/2023    9:45 AM  01/30/2023   10:04 AM 01/01/2023    9:58 AM 11/28/2022    9:45 AM  Depression screen PHQ 2/9  Decreased Interest 0 1 3 0 1 1 1   Down, Depressed, Hopeless 0 1 1 0 1 1 1   PHQ - 2 Score 0 2 4 0 2 2 2      Review of Systems  Musculoskeletal:  Positive for back pain, gait problem and neck pain.  All other systems reviewed and are negative.      Objective:   Physical Exam        Assessment & Plan:  HX: of Right AKA: Dr Lajoyce Corners Following. Continue to Monitor. 06/13/2023 Diabetic Polyneuropathy associated with Type 2 DM: Continue current medication regimen with Lyrica. Continue to Monitor. 06/13/2023 DM Type 2: PCP Following. Continue current medication regimen. Continue to Monitor. 06/13/2023 Chronic  Pain Syndrome : Refilled: Oxycodone 20 mg one tablet every 8 hours as needed for pain #90.  We will continue the opioid monitoring program, this consists of regular clinic visits, examinations, urine drug screen, pill counts as well as use of [place] Controlled Substance Reporting system. A 12 month History has been reviewed on the [place] Controlled Substance Reporting System on 06/13/2023 Stage 1 Decubitus: Left Buttock and Left Lower Extremity: Continue Adequate Nutrition and Weight Shifting.  Wound Care following. Continue to Monitor. 06/13/2023  6. Cellulitis: Ms. Weissberg was hospitalized on 06/14/2022- 06/20/2022, discharge summary was reviewed.  Continue to Monitpr. PCP Following.06/13/2023 7. Phantom Pain: Continue Lyrica. Continue to Monitor. 06/13/2023 8. Polyarthralgia: Continue HEP as Tolerated. Continue to Monitor. 06/13/2023 9. Chronic Bilateral Shoulder Pain: Continue HEP as tolerated. Continue to Monitor.06/13/2023  10. Chronic Bilateral Lower Back Pain without Sciatica: Continue Lidocaine Patches 12 hours on and 12 hours off, she verbalizes understanding. Continue HEP as tolerated. Continue current medication regimen. Continue to Monitor. 06/13/2023 11. Greater Trochanter Bursitis:No complaints today.  Continue to Alternate Ice and Heat Therapy. Continue to Monitor. 06/13/2023 12. Left Foot Ulcer: Husband performing dressing changes. Wound Care Following and Dr Lajoyce Corners Following. Continue to Monitor. 06/13/2023

## 2023-07-12 ENCOUNTER — Encounter: Payer: Self-pay | Admitting: Registered Nurse

## 2023-07-15 ENCOUNTER — Other Ambulatory Visit: Payer: Self-pay | Admitting: Family Medicine

## 2023-07-15 DIAGNOSIS — J45909 Unspecified asthma, uncomplicated: Secondary | ICD-10-CM

## 2023-07-15 LAB — DRUG TOX MONITOR 1 W/CONF, ORAL FLD
Amphetamines: NEGATIVE ng/mL (ref <10)
Barbiturates: NEGATIVE ng/mL (ref <10)
Benzodiazepines: NEGATIVE ng/mL (ref <0.50)
Buprenorphine: NEGATIVE ng/mL (ref <0.10)
Cocaine: NEGATIVE ng/mL (ref <5.0)
Codeine: NEGATIVE ng/mL (ref <2.5)
Dihydrocodeine: NEGATIVE ng/mL (ref <2.5)
Fentanyl: NEGATIVE ng/mL (ref <0.10)
Heroin Metabolite: NEGATIVE ng/mL (ref <1.0)
Hydrocodone: NEGATIVE ng/mL (ref <2.5)
Hydromorphone: NEGATIVE ng/mL (ref <2.5)
MARIJUANA: NEGATIVE ng/mL (ref <2.5)
MDMA: NEGATIVE ng/mL (ref <10)
Meprobamate: NEGATIVE ng/mL (ref <2.5)
Methadone: NEGATIVE ng/mL (ref <5.0)
Morphine: NEGATIVE ng/mL (ref <2.5)
Nicotine Metabolite: NEGATIVE ng/mL (ref <5.0)
Norhydrocodone: NEGATIVE ng/mL (ref <2.5)
Noroxycodone: 2.9 ng/mL — ABNORMAL HIGH (ref <2.5)
Opiates: POSITIVE ng/mL — AB (ref <2.5)
Oxycodone: 30.6 ng/mL — ABNORMAL HIGH (ref <2.5)
Oxymorphone: NEGATIVE ng/mL (ref <2.5)
Phencyclidine: NEGATIVE ng/mL (ref <10)
Tapentadol: NEGATIVE ng/mL (ref <5.0)
Tramadol: NEGATIVE ng/mL (ref <5.0)
Zolpidem: NEGATIVE ng/mL (ref <5.0)

## 2023-07-15 LAB — DRUG TOX ALC METAB W/CON, ORAL FLD: Alcohol Metabolite: NEGATIVE ng/mL (ref <25)

## 2023-07-26 ENCOUNTER — Ambulatory Visit (HOSPITAL_BASED_OUTPATIENT_CLINIC_OR_DEPARTMENT_OTHER): Payer: Self-pay | Admitting: General Surgery

## 2023-07-31 ENCOUNTER — Ambulatory Visit: Payer: Commercial Managed Care - HMO | Admitting: Nurse Practitioner

## 2023-07-31 NOTE — Progress Notes (Deleted)
Subjective:    Patient ID: [name], female    DOB: 03/06/1961, 62 y.o.   MRN: [id number]  HPI   Pain Inventory Average Pain {NUMBERS; 0-10:5044} Pain Right Now {NUMBERS; 0-10:5044} My pain is {PAIN DESCRIPTION:21022940}  In the last 24 hours, has pain interfered with the following? General activity {NUMBERS; 0-10:5044} Relation with others {NUMBERS; 0-10:5044} Enjoyment of life {NUMBERS; 0-10:5044} What TIME of day is your pain at its worst? {time of day:24191} Sleep (in general) {BHH GOOD/FAIR/POOR:22877}  Pain is worse with: {ACTIVITIES:21022942} Pain improves with: {PAIN IMPROVES WUJW:11914782} Relief from Meds: {NUMBERS; 0-10:5044}  Family History  Problem Relation Age of Onset   CAD Mother    Hypertension Mother    Heart attack Mother    Stroke Mother    CAD Father    Heart attack Father    Allergic rhinitis Father    Asthma Father    Hypertension Brother    Hypertension Brother    Pancreatic cancer Paternal Aunt    Breast cancer Paternal Aunt    Lung cancer Paternal Aunt    Allergic rhinitis Son    Asthma Son    Social History   Socioeconomic History   Marital status: Married    Spouse name: [name] Centrella   Number of children: 1   Years of education: 11   Highest education level: GED or equivalent  Occupational History    Comment: Not currrently working ( last worked 2004)  Tobacco Use   Smoking status: Former    Current packs/day: 0.00    Average packs/day: 1 pack/day for 19.4 years (19.4 ttl pk-yrs)    Types: Cigarettes    Start date: 14    Quit date: 02/11/2004    Years since quitting: 19.4    Passive exposure: Past   Smokeless tobacco: Never  Vaping Use   Vaping status: Never Used  Substance and Sexual Activity   Alcohol use: Yes    Comment: RARE Wine   Drug use: Not Currently    Types: Marijuana    Comment: "only in my teens"   Sexual activity: Yes    Birth control/protection: Surgical    Comment: Hysterectomy  Other Topics  Concern   Not on file  Social History Narrative   Not on file   Social Determinants of Health   Financial Resource Strain: Low Risk  (12/27/2021)   Overall Financial Resource Strain (CARDIA)    Difficulty of Paying Living Expenses: Not hard at all  Food Insecurity: No Food Insecurity (06/14/2022)   Hunger Vital Sign    Worried About Running Out of Food in the Last Year: Never true    Ran Out of Food in the Last Year: Never true  Transportation Needs: No Transportation Needs (06/14/2022)   PRAPARE - Administrator, Civil Service (Medical): No    Lack of Transportation (Non-Medical): No  Physical Activity: Inactive (08/25/2021)   Exercise Vital Sign    Days of Exercise per Week: 0 days    Minutes of Exercise per Session: 0 min  Stress: Stress Concern Present (08/25/2021)   [employer] of Occupational Health - Occupational Stress Questionnaire    Feeling of Stress : Very much  Social Connections: Unknown (01/22/2022)   Received from [facility]   Social Network    Social Network: Not on file   Past Surgical History:  Procedure Laterality Date   ABDOMINAL HYSTERECTOMY  06/2005   "w/right ovariy"   AMPUTATION Right 01/28/2020  RIGHT FOURTH AND FIFTH RAY AMPUTATION   AMPUTATION Right 01/28/2020   Procedure: RIGHT FOURTH AND FIFTH RAY AMPUTATION,;  Surgeon: [name], MD;  Location: [facility] OR;  Service: Orthopedics;  Laterality: Right;   AMPUTATION Right 06/01/2021   Procedure: RIGHT BELOW KNEE AMPUTATION;  Surgeon: [name], MD;  Location: [facility] OR;  Service: Orthopedics;  Laterality: Right;   AMPUTATION Right 07/22/2021   Procedure: RIGHT ABOVE KNEE AMPUTATION;  Surgeon: [name], MD;  Location: [facility] OR;  Service: Orthopedics;  Laterality: Right;   APPENDECTOMY     BREAST BIOPSY Bilateral    9 total (04/18/2018)   CORONARY ANGIOPLASTY WITH STENT PLACEMENT  10/01/2015   normal LM, 30% LAD, 85% mid RCA and 95% distal RCA s/p PCI of the mid to  distal RCA and now on DAPT with ASA and Ticagrelor.     CORONARY STENT INTERVENTION Right 04/18/2018   Procedure: CORONARY STENT INTERVENTION;  Surgeon: [name], MD;  Location: [facility];  Service: Cardiovascular;  Laterality: Right;   DILATION AND CURETTAGE OF UTERUS     FRACTURE SURGERY     LAPAROSCOPIC CHOLECYSTECTOMY     LEFT HEART CATH AND CORONARY ANGIOGRAPHY N/A 04/18/2018   Procedure: LEFT HEART CATH AND CORONARY ANGIOGRAPHY;  Surgeon: [name], MD;  Location: [facility];  Service: Cardiovascular;  Laterality: N/A;   MUSCLE BIOPSY Left    "leg"   OOPHORECTOMY  07/2010   RADIOLOGY WITH ANESTHESIA N/A 05/25/2016   Procedure: RADIOLOGY WITH ANESTHESIA;  Surgeon: Medication Radiologist, MD;  Location: [facility] OR;  Service: Radiology;  Laterality: N/A;   STUMP REVISION Right 06/29/2021   Procedure: REVISION RIGHT BELOW KNEE AMPUTATION;  Surgeon: [name], MD;  Location: [facility] OR;  Service: Orthopedics;  Laterality: Right;   TEE WITHOUT CARDIOVERSION N/A 06/01/2021   Procedure: TRANSESOPHAGEAL ECHOCARDIOGRAM (TEE);  Surgeon: Sande Rives, MD;  Location: [facility] OR;  Service: Cardiovascular;  Laterality: N/A;   TONSILLECTOMY AND ADENOIDECTOMY     WRIST FRACTURE SURGERY Left    "crushed it"   Past Surgical History:  Procedure Laterality Date   ABDOMINAL HYSTERECTOMY  06/2005   "w/right ovariy"   AMPUTATION Right 01/28/2020    RIGHT FOURTH AND FIFTH RAY AMPUTATION   AMPUTATION Right 01/28/2020   Procedure: RIGHT FOURTH AND FIFTH RAY AMPUTATION,;  Surgeon: [name], MD;  Location: [facility] OR;  Service: Orthopedics;  Laterality: Right;   AMPUTATION Right 06/01/2021   Procedure: RIGHT BELOW KNEE AMPUTATION;  Surgeon: [name], MD;  Location: [facility] OR;  Service: Orthopedics;  Laterality: Right;   AMPUTATION Right 07/22/2021   Procedure: RIGHT ABOVE KNEE AMPUTATION;  Surgeon: [name], MD;  Location: [facility] OR;  Service: Orthopedics;   Laterality: Right;   APPENDECTOMY     BREAST BIOPSY Bilateral    9 total (04/18/2018)   CORONARY ANGIOPLASTY WITH STENT PLACEMENT  10/01/2015   normal LM, 30% LAD, 85% mid RCA and 95% distal RCA s/p PCI of the mid to distal RCA and now on DAPT with ASA and Ticagrelor.     CORONARY STENT INTERVENTION Right 04/18/2018   Procedure: CORONARY STENT INTERVENTION;  Surgeon: [name], MD;  Location: [facility];  Service: Cardiovascular;  Laterality: Right;   DILATION AND CURETTAGE OF UTERUS     FRACTURE SURGERY     LAPAROSCOPIC CHOLECYSTECTOMY     LEFT HEART CATH AND CORONARY ANGIOGRAPHY N/A 04/18/2018   Procedure: LEFT HEART  CATH AND CORONARY ANGIOGRAPHY;  Surgeon: [name], MD;  Location: [facility];  Service: Cardiovascular;  Laterality: N/A;   MUSCLE BIOPSY Left    "leg"   OOPHORECTOMY  07/2010   RADIOLOGY WITH ANESTHESIA N/A 05/25/2016   Procedure: RADIOLOGY WITH ANESTHESIA;  Surgeon: Medication Radiologist, MD;  Location: [facility] OR;  Service: Radiology;  Laterality: N/A;   STUMP REVISION Right 06/29/2021   Procedure: REVISION RIGHT BELOW KNEE AMPUTATION;  Surgeon: [name], MD;  Location: [facility] OR;  Service: Orthopedics;  Laterality: Right;   TEE WITHOUT CARDIOVERSION N/A 06/01/2021   Procedure: TRANSESOPHAGEAL ECHOCARDIOGRAM (TEE);  Surgeon: Sande Rives, MD;  Location: [facility] OR;  Service: Cardiovascular;  Laterality: N/A;   TONSILLECTOMY AND ADENOIDECTOMY     WRIST FRACTURE SURGERY Left    "crushed it"   Past Medical History:  Diagnosis Date   Aortic stenosis    mild AS by echo 02/2021   Asthma    "on daily RX and rescue inhaler" (04/18/2018)   Chronic diastolic CHF (congestive heart failure) (HCC) 09/2015   Chronic lower back pain    Chronic neck pain    Chronic pain syndrome    Fentanyl Patch   Colon polyps    Coronary artery disease    cath with normal LM, 30% LAD, 85% mid RCA and 95% distal RCA s/p PCI of the mid to distal RCA and now  on DAPT with ASA and Ticagrelor.     Eczema    Excessive daytime sleepiness 11/26/2015   Fibromyalgia    Gallstones    GERD (gastroesophageal reflux disease)    Heart murmur    "noted for the 1st time on 04/18/2018"   History of blood transfusion 07/2010   "S/P oophorectomy"   History of gout    History of hiatal hernia 1980s   "gone now" (04/18/2018)   Hyperlipidemia    Hypertension    takes Metoprolol and Enalapril daily   Hypothyroidism    takes Synthroid daily   IBS (irritable bowel syndrome)    Migraine    "nothing in the 2000s" (04/18/2018)   Mixed connective tissue disease (HCC)    NAFLD (nonalcoholic fatty liver disease)    Pneumonia    "several times" (04/18/2018)   PVC's (premature ventricular contractions)    noted on event monitor 02/2021   Rheumatoid arthritis (HCC)    "hands, elbows, shoulders, probably knees" (04/18/2018)   Scoliosis    Sjogren's syndrome (HCC)    Sleep apnea    mild - does not use cpap   Spondylosis    Type II diabetes mellitus (HCC)    takes Metformin and Hum R daily (04/18/2018)   Walker as ambulation aid    also uses wheelchair   There were no vitals taken for this visit.  Opioid Risk Score:   Fall Risk Score:  `1  Depression screen [facility] 2/9     07/10/2023   11:08 AM 06/13/2023   10:36 AM 05/11/2023    9:02 AM 04/12/2023    9:45 AM 01/30/2023   10:04 AM 01/01/2023    9:58 AM 11/28/2022    9:45 AM  Depression screen PHQ 2/9  Decreased Interest 0 1 3 0 1 1 1   Down, Depressed, Hopeless 0 1 1 0 1 1 1   PHQ - 2 Score 0 2 4 0 2 2 2     Review of Systems     Objective:   Physical Exam  Assessment & Plan:

## 2023-08-01 ENCOUNTER — Telehealth (INDEPENDENT_AMBULATORY_CARE_PROVIDER_SITE_OTHER): Payer: Managed Care, Other (non HMO) | Admitting: Family Medicine

## 2023-08-01 ENCOUNTER — Encounter: Payer: Self-pay | Admitting: Family Medicine

## 2023-08-01 DIAGNOSIS — L089 Local infection of the skin and subcutaneous tissue, unspecified: Secondary | ICD-10-CM

## 2023-08-01 DIAGNOSIS — M25562 Pain in left knee: Secondary | ICD-10-CM

## 2023-08-01 MED ORDER — DOXYCYCLINE HYCLATE 100 MG PO TABS: 100.0000 mg | ORAL_TABLET | Freq: Two times a day (BID) | ORAL | 0 refills | Status: DC

## 2023-08-01 MED ORDER — PREDNISONE 20 MG PO TABS: 40.0000 mg | ORAL_TABLET | Freq: Every day | ORAL | 0 refills | Status: AC

## 2023-08-01 NOTE — Progress Notes (Signed)
Chief Complaint  Patient presents with   Infection    Discuss infection    [name] is a 62 y.o. female here for a skin complaint. We are interacting via web portal for an electronic face-to-face visit. I verified patient's ID using 2 identifiers. Patient agreed to proceed with visit via this method. Patient is at home, I am at office. Patient, her husband [name] and I are present for visit.   Duration: several days Location: R breast Pruritic? No Painful? Yes Drainage? No New soaps/lotions/topicals/detergents? No Sick contacts? No Other associated symptoms: got better on doxy, might not have fully recovered.  Therapies tried thus far: doxycycline 1 mo ago  Left knee pain Has had intermittent left knee pain.  No recent injury or change activity.  She does have some associated swelling but no fevers or redness.  Prednisone helped around a month ago.  It flared up recently after some standing.  No catching or locking.  No neurologic signs or symptoms from this.  Past Medical History:  Diagnosis Date   Aortic stenosis    mild AS by echo 02/2021   Asthma    "on daily RX and rescue inhaler" (04/18/2018)   Chronic diastolic CHF (congestive heart failure) (HCC) 09/2015   Chronic lower back pain    Chronic neck pain    Chronic pain syndrome    Fentanyl Patch   Colon polyps    Coronary artery disease    cath with normal LM, 30% LAD, 85% mid RCA and 95% distal RCA s/p PCI of the mid to distal RCA and now on DAPT with ASA and Ticagrelor.     Eczema    Excessive daytime sleepiness 11/26/2015   Fibromyalgia    Gallstones    GERD (gastroesophageal reflux disease)    Heart murmur    "noted for the 1st time on 04/18/2018"   History of blood transfusion 07/2010   "S/P oophorectomy"   History of gout    History of hiatal hernia 1980s   "gone now" (04/18/2018)   Hyperlipidemia    Hypertension    takes Metoprolol and Enalapril daily   Hypothyroidism    takes Synthroid daily   IBS  (irritable bowel syndrome)    Migraine    "nothing in the 2000s" (04/18/2018)   Mixed connective tissue disease (HCC)    NAFLD (nonalcoholic fatty liver disease)    Pneumonia    "several times" (04/18/2018)   PVC's (premature ventricular contractions)    noted on event monitor 02/2021   Rheumatoid arthritis (HCC)    "hands, elbows, shoulders, probably knees" (04/18/2018)   Scoliosis    Sjogren's syndrome (HCC)    Sleep apnea    mild - does not use cpap   Spondylosis    Type II diabetes mellitus (HCC)    takes Metformin and Hum R daily (04/18/2018)   Walker as ambulation aid    also uses wheelchair   No conversational dyspnea Age appropriate judgment and insight Nml affect and mood  Skin infection - Plan: doxycycline (VIBRA-TABS) 100 MG tablet  Acute pain of left knee - Plan: predniSONE (DELTASONE) 20 MG tablet  14 d of doxy. She has diflucan at home. Send message if no improvement.  5-day prednisone burst.  Warned about signs/symptoms of a septic joint which does not sound like she has.  If anything does arise like this, she will seek immediate medical care.  She has an appointment with a pain specialist in 2 days.  Ideally  they would perform aspiration and injection as has been helpful before but we can do it Friday if they choose not to. F/u prn. The patient voiced understanding and agreement to the plan.  Jilda Roche [place], DO 08/01/23 10:47 AM

## 2023-08-03 ENCOUNTER — Encounter: Payer: Commercial Managed Care - HMO | Admitting: Registered Nurse

## 2023-08-07 ENCOUNTER — Encounter: Payer: Commercial Managed Care - HMO | Attending: Registered Nurse | Admitting: Registered Nurse

## 2023-08-08 ENCOUNTER — Other Ambulatory Visit: Payer: Self-pay | Admitting: Family Medicine

## 2023-08-13 ENCOUNTER — Ambulatory Visit: Payer: Commercial Managed Care - HMO | Admitting: Family Medicine

## 2023-08-14 ENCOUNTER — Encounter: Payer: Commercial Managed Care - HMO | Admitting: Registered Nurse

## 2023-08-16 ENCOUNTER — Other Ambulatory Visit: Payer: Self-pay | Admitting: Family Medicine

## 2023-08-16 ENCOUNTER — Encounter: Payer: Commercial Managed Care - HMO | Attending: Registered Nurse | Admitting: Registered Nurse

## 2023-08-16 ENCOUNTER — Encounter: Payer: Self-pay | Admitting: Registered Nurse

## 2023-08-16 DIAGNOSIS — M25512 Pain in left shoulder: Secondary | ICD-10-CM | POA: Insufficient documentation

## 2023-08-16 DIAGNOSIS — M25511 Pain in right shoulder: Secondary | ICD-10-CM | POA: Diagnosis not present

## 2023-08-16 DIAGNOSIS — G8929 Other chronic pain: Secondary | ICD-10-CM | POA: Insufficient documentation

## 2023-08-16 DIAGNOSIS — M545 Low back pain, unspecified: Secondary | ICD-10-CM | POA: Diagnosis not present

## 2023-08-16 DIAGNOSIS — Z89611 Acquired absence of right leg above knee: Secondary | ICD-10-CM | POA: Insufficient documentation

## 2023-08-16 DIAGNOSIS — M2550 Pain in unspecified joint: Secondary | ICD-10-CM | POA: Insufficient documentation

## 2023-08-16 DIAGNOSIS — G894 Chronic pain syndrome: Secondary | ICD-10-CM | POA: Insufficient documentation

## 2023-08-16 DIAGNOSIS — Z79899 Other long term (current) drug therapy: Secondary | ICD-10-CM | POA: Insufficient documentation

## 2023-08-16 DIAGNOSIS — Z5181 Encounter for therapeutic drug level monitoring: Secondary | ICD-10-CM | POA: Insufficient documentation

## 2023-08-16 DIAGNOSIS — M7061 Trochanteric bursitis, right hip: Secondary | ICD-10-CM | POA: Insufficient documentation

## 2023-08-16 DIAGNOSIS — M7062 Trochanteric bursitis, left hip: Secondary | ICD-10-CM | POA: Insufficient documentation

## 2023-08-16 DIAGNOSIS — M542 Cervicalgia: Secondary | ICD-10-CM | POA: Insufficient documentation

## 2023-08-16 MED ORDER — OXYCODONE HCL 20 MG PO TABS: 1.0000 | ORAL_TABLET | Freq: Three times a day (TID) | ORAL | 0 refills | Status: DC | PRN

## 2023-08-16 NOTE — Progress Notes (Addendum)
Subjective:    Patient ID: [name], female    DOB: May 25, 1961, 62 y.o.   MRN: [id number]  HPI: [name] is a 62 y.o. female who  is scheduled for a Telephone visit today, her husband came to the officeearlier today, stating he couldn't get his wife out of the car, and she wasn't feeling well. He was instructed to take her to Urgent Care. [name] is scheduled for telephone visit, she stated she didn't go to Urgent care, she will be calling her PCP today to scheduled an appointment.   I connected with [name] by telephone and verified that I am speaking with the correct person using two identifiers.  Location: Patient: In her Home Provider:In the Office    I discussed the limitations, risks, security and privacy concerns of performing an evaluation and management service by telephone and the availability of in person appointments. I also discussed with the patient that there may be a patient responsible charge related to this service. The patient expressed understanding and agreed to proceed.  She states her pain is located in her neck, bilateral shoulders, lower back and bilateral hips. She also reports generalize muscle aches and generalize joint pain. She  rates her pain 8. She;s not following her current exercise regimen at this time.   [name] Morphine equivalent is 90.00 MME.   Last Oral Swab was Performed on 07/10/2023, it was consistent.      Pain Inventory Average Pain 8 Pain Right Now 8 My pain is sharp, burning, dull, stabbing, tingling, and aching  In the last 24 hours, has pain interfered with the following? General activity 8 Relation with others 8 Enjoyment of life 8 What TIME of day is your pain at its worst? morning , daytime, evening, and night Sleep (in general) Poor  Pain is worse with: walking, sitting, and standing Pain improves with: medication and TENS Relief from Meds: 5  Family History  Problem Relation Age of Onset    CAD Mother    Hypertension Mother    Heart attack Mother    Stroke Mother    CAD Father    Heart attack Father    Allergic rhinitis Father    Asthma Father    Hypertension Brother    Hypertension Brother    Pancreatic cancer Paternal Aunt    Breast cancer Paternal Aunt    Lung cancer Paternal Aunt    Allergic rhinitis Son    Asthma Son    Social History   Socioeconomic History   Marital status: Married    Spouse name: [name] Karas   Number of children: 1   Years of education: 11   Highest education level: GED or equivalent  Occupational History    Comment: Not currrently working ( last worked 2004)  Tobacco Use   Smoking status: Former    Current packs/day: 0.00    Average packs/day: 1 pack/day for 19.4 years (19.4 ttl pk-yrs)    Types: Cigarettes    Start date: 54    Quit date: 02/11/2004    Years since quitting: 19.5    Passive exposure: Past   Smokeless tobacco: Never  Vaping Use   Vaping status: Never Used  Substance and Sexual Activity   Alcohol use: Yes    Comment: RARE Wine   Drug use: Not Currently    Types: Marijuana    Comment: "only in my teens"   Sexual activity: Yes    Birth control/protection:  Surgical    Comment: Hysterectomy  Other Topics Concern   Not on file  Social History Narrative   Not on file   Social Determinants of Health   Financial Resource Strain: Low Risk  (12/27/2021)   Overall Financial Resource Strain (CARDIA)    Difficulty of Paying Living Expenses: Not hard at all  Food Insecurity: No Food Insecurity (06/14/2022)   Hunger Vital Sign    Worried About Running Out of Food in the Last Year: Never true    Ran Out of Food in the Last Year: Never true  Transportation Needs: No Transportation Needs (06/14/2022)   PRAPARE - Administrator, Civil Service (Medical): No    Lack of Transportation (Non-Medical): No  Physical Activity: Inactive (08/25/2021)   Exercise Vital Sign    Days of Exercise per Week: 0 days     Minutes of Exercise per Session: 0 min  Stress: Stress Concern Present (08/25/2021)   [employer] of Occupational Health - Occupational Stress Questionnaire    Feeling of Stress : Very much  Social Connections: Unknown (01/22/2022)   [facility]   Social Network    Social Network: Not on file   Past Surgical History:  Procedure Laterality Date   ABDOMINAL HYSTERECTOMY  06/2005   "w/right ovariy"   AMPUTATION Right 01/28/2020    RIGHT FOURTH AND FIFTH RAY AMPUTATION   AMPUTATION Right 01/28/2020   Procedure: RIGHT FOURTH AND FIFTH RAY AMPUTATION,;  Surgeon: [name], MD;  Location: [facility] OR;  Service: Orthopedics;  Laterality: Right;   AMPUTATION Right 06/01/2021   Procedure: RIGHT BELOW KNEE AMPUTATION;  Surgeon: [name], MD;  Location: [facility] OR;  Service: Orthopedics;  Laterality: Right;   AMPUTATION Right 07/22/2021   Procedure: RIGHT ABOVE KNEE AMPUTATION;  Surgeon: [name], MD;  Location: [facility] OR;  Service: Orthopedics;  Laterality: Right;   APPENDECTOMY     BREAST BIOPSY Bilateral    9 total (04/18/2018)   CORONARY ANGIOPLASTY WITH STENT PLACEMENT  10/01/2015   normal LM, 30% LAD, 85% mid RCA and 95% distal RCA s/p PCI of the mid to distal RCA and now on DAPT with ASA and Ticagrelor.     CORONARY STENT INTERVENTION Right 04/18/2018   Procedure: CORONARY STENT INTERVENTION;  Surgeon: [name], MD;  Location: [facility];  Service: Cardiovascular;  Laterality: Right;   DILATION AND CURETTAGE OF UTERUS     FRACTURE SURGERY     LAPAROSCOPIC CHOLECYSTECTOMY     LEFT HEART CATH AND CORONARY ANGIOGRAPHY N/A 04/18/2018   Procedure: LEFT HEART CATH AND CORONARY ANGIOGRAPHY;  Surgeon: [name], MD;  Location: [facility];  Service: Cardiovascular;  Laterality: N/A;   MUSCLE BIOPSY Left    "leg"   OOPHORECTOMY  07/2010   RADIOLOGY WITH ANESTHESIA N/A 05/25/2016   Procedure: RADIOLOGY WITH ANESTHESIA;   Surgeon: Medication Radiologist, MD;  Location: [facility] OR;  Service: Radiology;  Laterality: N/A;   STUMP REVISION Right 06/29/2021   Procedure: REVISION RIGHT BELOW KNEE AMPUTATION;  Surgeon: [name], MD;  Location: [facility] OR;  Service: Orthopedics;  Laterality: Right;   TEE WITHOUT CARDIOVERSION N/A 06/01/2021   Procedure: TRANSESOPHAGEAL ECHOCARDIOGRAM (TEE);  Surgeon: Sande Rives, MD;  Location: [facility] OR;  Service: Cardiovascular;  Laterality: N/A;   TONSILLECTOMY AND ADENOIDECTOMY     WRIST FRACTURE SURGERY Left    "crushed it"   Past Surgical History:  Procedure Laterality  Date   ABDOMINAL HYSTERECTOMY  06/2005   "w/right ovariy"   AMPUTATION Right 01/28/2020    RIGHT FOURTH AND FIFTH RAY AMPUTATION   AMPUTATION Right 01/28/2020   Procedure: RIGHT FOURTH AND FIFTH RAY AMPUTATION,;  Surgeon: [name], MD;  Location: [facility] OR;  Service: Orthopedics;  Laterality: Right;   AMPUTATION Right 06/01/2021   Procedure: RIGHT BELOW KNEE AMPUTATION;  Surgeon: [name], MD;  Location: [facility] OR;  Service: Orthopedics;  Laterality: Right;   AMPUTATION Right 07/22/2021   Procedure: RIGHT ABOVE KNEE AMPUTATION;  Surgeon: [name], MD;  Location: [facility] OR;  Service: Orthopedics;  Laterality: Right;   APPENDECTOMY     BREAST BIOPSY Bilateral    9 total (04/18/2018)   CORONARY ANGIOPLASTY WITH STENT PLACEMENT  10/01/2015   normal LM, 30% LAD, 85% mid RCA and 95% distal RCA s/p PCI of the mid to distal RCA and now on DAPT with ASA and Ticagrelor.     CORONARY STENT INTERVENTION Right 04/18/2018   Procedure: CORONARY STENT INTERVENTION;  Surgeon: [name], MD;  Location: [facility];  Service: Cardiovascular;  Laterality: Right;   DILATION AND CURETTAGE OF UTERUS     FRACTURE SURGERY     LAPAROSCOPIC CHOLECYSTECTOMY     LEFT HEART CATH AND CORONARY ANGIOGRAPHY N/A 04/18/2018   Procedure: LEFT HEART CATH AND CORONARY ANGIOGRAPHY;  Surgeon: [name], MD;   Location: [facility];  Service: Cardiovascular;  Laterality: N/A;   MUSCLE BIOPSY Left    "leg"   OOPHORECTOMY  07/2010   RADIOLOGY WITH ANESTHESIA N/A 05/25/2016   Procedure: RADIOLOGY WITH ANESTHESIA;  Surgeon: Medication Radiologist, MD;  Location: [facility] OR;  Service: Radiology;  Laterality: N/A;   STUMP REVISION Right 06/29/2021   Procedure: REVISION RIGHT BELOW KNEE AMPUTATION;  Surgeon: [name], MD;  Location: [facility] OR;  Service: Orthopedics;  Laterality: Right;   TEE WITHOUT CARDIOVERSION N/A 06/01/2021   Procedure: TRANSESOPHAGEAL ECHOCARDIOGRAM (TEE);  Surgeon: Sande Rives, MD;  Location: [facility] OR;  Service: Cardiovascular;  Laterality: N/A;   TONSILLECTOMY AND ADENOIDECTOMY     WRIST FRACTURE SURGERY Left    "crushed it"   Past Medical History:  Diagnosis Date   Aortic stenosis    mild AS by echo 02/2021   Asthma    "on daily RX and rescue inhaler" (04/18/2018)   Chronic diastolic CHF (congestive heart failure) (HCC) 09/2015   Chronic lower back pain    Chronic neck pain    Chronic pain syndrome    Fentanyl Patch   Colon polyps    Coronary artery disease    cath with normal LM, 30% LAD, 85% mid RCA and 95% distal RCA s/p PCI of the mid to distal RCA and now on DAPT with ASA and Ticagrelor.     Eczema    Excessive daytime sleepiness 11/26/2015   Fibromyalgia    Gallstones    GERD (gastroesophageal reflux disease)    Heart murmur    "noted for the 1st time on 04/18/2018"   History of blood transfusion 07/2010   "S/P oophorectomy"   History of gout    History of hiatal hernia 1980s   "gone now" (04/18/2018)   Hyperlipidemia    Hypertension    takes Metoprolol and Enalapril daily   Hypothyroidism    takes Synthroid daily   IBS (irritable bowel syndrome)    Migraine    "nothing in the 2000s" (04/18/2018)   Mixed connective  tissue disease (HCC)    NAFLD (nonalcoholic fatty liver disease)    Pneumonia    "several times" (04/18/2018)   PVC's (premature  ventricular contractions)    noted on event monitor 02/2021   Rheumatoid arthritis (HCC)    "hands, elbows, shoulders, probably knees" (04/18/2018)   Scoliosis    Sjogren's syndrome (HCC)    Sleep apnea    mild - does not use cpap   Spondylosis    Type II diabetes mellitus (HCC)    takes Metformin and Hum R daily (04/18/2018)   Walker as ambulation aid    also uses wheelchair   There were no vitals taken for this visit.  Opioid Risk Score:   Fall Risk Score:  `1  Depression screen [facility] 2/9     07/10/2023   11:08 AM 06/13/2023   10:36 AM 05/11/2023    9:02 AM 04/12/2023    9:45 AM 01/30/2023   10:04 AM 01/01/2023    9:58 AM 11/28/2022    9:45 AM  Depression screen PHQ 2/9  Decreased Interest 0 1 3 0 1 1 1   Down, Depressed, Hopeless 0 1 1 0 1 1 1   PHQ - 2 Score 0 2 4 0 2 2 2     Review of Systems  Musculoskeletal:  Positive for back pain, gait problem and myalgias.  Neurological:  Positive for weakness.  All other systems reviewed and are negative.     Objective:   Physical Exam Vitals and nursing note reviewed.  Musculoskeletal:     Comments: No Physical Assessment Performed: Telephone Visit          Assessment & Plan:  HX: of Right AKA: Dr Lajoyce Corners Following. Continue to Monitor. 08/16/2023 Diabetic Polyneuropathy associated with Type 2 DM: Continue current medication regimen with Lyrica. Continue to Monitor. 08/16/2023 DM Type 2: PCP Following. Continue current medication regimen. Continue to Monitor. 08/16/2023 Chronic  Pain Syndrome : Refilled: Oxycodone 20 mg one tablet every 8 hours as needed for pain #90.  We will continue the opioid monitoring program, this consists of regular clinic visits, examinations, urine drug screen, pill counts as well as use of [place] Controlled Substance Reporting system. A 12 month History has been reviewed on the [place] Controlled Substance Reporting System on 08/16/2023 Stage 1 Decubitus: Left Buttock and Left Lower Extremity:  Continue Adequate Nutrition and Weight Shifting.  Wound Care following. Continue to Monitor. 08/16/2023  6. Cellulitis: Ms. Sefcik states Wound Care Following,  Continue to Monitpr. 08/16/2023 7. Phantom Pain: Continue Lyrica. Continue to Monitor. 08/16/2023 8. Polyarthralgia: Continue HEP as Tolerated. Continue to Monitor. 08/16/2023 9. Chronic Bilateral Shoulder Pain: Continue HEP as tolerated. Continue to Monitor.08/16/2023  10. Chronic Bilateral Lower Back Pain without Sciatica: Continue Lidocaine Patches 12 hours on and 12 hours off, she verbalizes understanding. Continue HEP as tolerated. Continue current medication regimen. Continue to Monitor. 08/16/2023 11. Greater Trochanter Bursitis: Continue to Alternate Ice and Heat Therapy. Continue to Monitor. 08/16/2023 12. Left Foot Ulcer: Husband performing dressing changes. Wound Care Following and Dr Lajoyce Corners Following. Continue to Monitor. 08/16/2023  13. Cervicalgia: Continue HEP as tolerated. Continue to Monitor.  14. Myofascial Pain Syndrome: Continue current medication regimen. Continue to Monitor.   Telephone Visit Establish Patient Location of patient: In her Home Location of Provider: In the Office  Time: 10 minutes  F/U in 3 weeks

## 2023-08-17 ENCOUNTER — Telehealth: Payer: Self-pay

## 2023-08-17 NOTE — Telephone Encounter (Signed)
PA approved.   CaseId:[id number];Status:Approved;Review Type:Qty;Coverage Start Date:08/17/2023;Coverage End Date:09/16/2023; Authorization Expiration Date: 09/15/2023

## 2023-08-17 NOTE — Telephone Encounter (Signed)
PA initiated via Covermymeds; KEY: [id number]. Awaiting determination.

## 2023-08-22 ENCOUNTER — Ambulatory Visit: Payer: Commercial Managed Care - HMO | Admitting: Family Medicine

## 2023-08-24 ENCOUNTER — Other Ambulatory Visit: Payer: Self-pay | Admitting: Family Medicine

## 2023-08-30 ENCOUNTER — Other Ambulatory Visit: Payer: Self-pay | Admitting: Family Medicine

## 2023-09-02 ENCOUNTER — Other Ambulatory Visit: Payer: Self-pay

## 2023-09-02 ENCOUNTER — Encounter (HOSPITAL_COMMUNITY): Payer: Self-pay | Admitting: Emergency Medicine

## 2023-09-02 ENCOUNTER — Emergency Department (HOSPITAL_COMMUNITY)
Admission: EM | Admit: 2023-09-02 | Discharge: 2023-09-03 | Disposition: A | Discharge status: Home / Self Care | Payer: Commercial Managed Care - HMO | Attending: Emergency Medicine | Admitting: Emergency Medicine

## 2023-09-02 ENCOUNTER — Emergency Department (HOSPITAL_COMMUNITY): Payer: Commercial Managed Care - HMO

## 2023-09-02 DIAGNOSIS — M25562 Pain in left knee: Secondary | ICD-10-CM | POA: Diagnosis present

## 2023-09-02 DIAGNOSIS — M79601 Pain in right arm: Secondary | ICD-10-CM

## 2023-09-02 DIAGNOSIS — I110 Hypertensive heart disease with heart failure: Secondary | ICD-10-CM | POA: Insufficient documentation

## 2023-09-02 DIAGNOSIS — I5032 Chronic diastolic (congestive) heart failure: Secondary | ICD-10-CM | POA: Diagnosis not present

## 2023-09-02 DIAGNOSIS — J45909 Unspecified asthma, uncomplicated: Secondary | ICD-10-CM | POA: Insufficient documentation

## 2023-09-02 DIAGNOSIS — Z7982 Long term (current) use of aspirin: Secondary | ICD-10-CM | POA: Diagnosis not present

## 2023-09-02 DIAGNOSIS — Z79899 Other long term (current) drug therapy: Secondary | ICD-10-CM | POA: Insufficient documentation

## 2023-09-02 DIAGNOSIS — I2510 Atherosclerotic heart disease of native coronary artery without angina pectoris: Secondary | ICD-10-CM | POA: Diagnosis not present

## 2023-09-02 DIAGNOSIS — E119 Type 2 diabetes mellitus without complications: Secondary | ICD-10-CM | POA: Insufficient documentation

## 2023-09-02 DIAGNOSIS — Z7951 Long term (current) use of inhaled steroids: Secondary | ICD-10-CM | POA: Diagnosis not present

## 2023-09-02 DIAGNOSIS — M25511 Pain in right shoulder: Secondary | ICD-10-CM | POA: Diagnosis not present

## 2023-09-02 DIAGNOSIS — Z91010 Allergy to peanuts: Secondary | ICD-10-CM | POA: Insufficient documentation

## 2023-09-02 DIAGNOSIS — Z7984 Long term (current) use of oral hypoglycemic drugs: Secondary | ICD-10-CM | POA: Diagnosis not present

## 2023-09-02 DIAGNOSIS — E039 Hypothyroidism, unspecified: Secondary | ICD-10-CM | POA: Insufficient documentation

## 2023-09-02 DIAGNOSIS — Z794 Long term (current) use of insulin: Secondary | ICD-10-CM | POA: Diagnosis not present

## 2023-09-02 DIAGNOSIS — N61 Mastitis without abscess: Secondary | ICD-10-CM

## 2023-09-02 NOTE — ED Provider Notes (Signed)
EMERGENCY DEPARTMENT AT [facility] Provider Note   CSN: [id number] Arrival date & time: 09/02/23  2047     History  Chief Complaint  Patient presents with   Extremity Pain    [name] is a 62 y.o. female.   Extremity Pain  Patient presents with pain in her left knee and right shoulder.  Somewhat acute on chronic.  States has been more swollen.  Reviewed PMNR notes and has had chronic pain here.  States right arm is more swollen although it has been coming down.  States she was sitting with her arm propped up and became more swollen.  Also more swelling on her right breast.  Also's more swelling on abdomen.  No feeling sick fever.  No shortness of breath.    Past Medical History:  Diagnosis Date   Aortic stenosis    mild AS by echo 02/2021   Asthma    "on daily RX and rescue inhaler" (04/18/2018)   Chronic diastolic CHF (congestive heart failure) (HCC) 09/2015   Chronic lower back pain    Chronic neck pain    Chronic pain syndrome    Fentanyl Patch   Colon polyps    Coronary artery disease    cath with normal LM, 30% LAD, 85% mid RCA and 95% distal RCA s/p PCI of the mid to distal RCA and now on DAPT with ASA and Ticagrelor.     Eczema    Excessive daytime sleepiness 11/26/2015   Fibromyalgia    Gallstones    GERD (gastroesophageal reflux disease)    Heart murmur    "noted for the 1st time on 04/18/2018"   History of blood transfusion 07/2010   "S/P oophorectomy"   History of gout    History of hiatal hernia 1980s   "gone now" (04/18/2018)   Hyperlipidemia    Hypertension    takes Metoprolol and Enalapril daily   Hypothyroidism    takes Synthroid daily   IBS (irritable bowel syndrome)    Migraine    "nothing in the 2000s" (04/18/2018)   Mixed connective tissue disease (HCC)    NAFLD (nonalcoholic fatty liver disease)    Pneumonia    "several times" (04/18/2018)   PVC's (premature ventricular contractions)    noted on event monitor 02/2021    Rheumatoid arthritis (HCC)    "hands, elbows, shoulders, probably knees" (04/18/2018)   Scoliosis    Sjogren's syndrome (HCC)    Sleep apnea    mild - does not use cpap   Spondylosis    Type II diabetes mellitus (HCC)    takes Metformin and Hum R daily (04/18/2018)   Walker as ambulation aid    also uses wheelchair    Home Medications Prior to Admission medications   Medication Sig Start Date End Date Taking? Authorizing Provider  acetaminophen (TYLENOL) 500 MG tablet Take 500 mg by mouth every 8 (eight) hours as needed for mild pain.    [provider]  albuterol (PROVENTIL) (2.5 MG/3ML) 0.083% nebulizer solution USE 1 VIAL IN NEBULIZER EVERY 4 HOURS AS NEEDED FOR WHEEZING FOR SHORTNESS OF BREATH 09/29/22   Sharlene Dory, DO  albuterol (VENTOLIN HFA) 108 (90 Base) MCG/ACT inhaler INHALE 2 PUFFS BY MOUTH EVERY 6 HOURS AS NEEDED FOR WHEEZING FOR SHORTNESS OF BREATH 04/26/23   Wendling, Jilda Roche, DO  ascorbic acid (VITAMIN C) 500 MG tablet Take 1 tablet (500 mg total) by mouth 2 (two) times daily. Patient taking differently: Take  500 mg by mouth daily. 06/24/21   Love, Evlyn Kanner, PA-C  aspirin EC 81 MG tablet Take by mouth. 12/22/15   [provider]  augmented betamethasone dipropionate (DIPROLENE AF) 0.05 % cream Apply 1 Application topically 2 (two) times daily as needed (rash). 01/30/23   Sharlene Dory, DO  Blood Glucose Monitoring Suppl (ONETOUCH VERIO REFLECT) w/Device KIT Use to check blood sugar 4 times daily.  DX E11.65 and Z79.4 04/12/23   Sharlene Dory, DO  Continuous Glucose Sensor (DEXCOM G7 SENSOR) MISC USE TO CHECK FOR BLOOD SUGAR CONTINUOUSLY- CHANGE SENSOR EVERY 10 DAYS 05/08/23   Sharlene Dory, DO  diclofenac Sodium (PENNSAID) 2 % SOLN APPLY 2 PUMPS (40MG  TOTAL) TOPICALLY TWICE DAILY AS NEEDED 08/17/23   Wendling, Jilda Roche, DO  diclofenac Sodium (VOLTAREN) 1 % GEL Apply 2 g topically 4 (four) times daily. 06/28/23    Raulkar, Drema Pry, MD  Diclofenac Sodium 3 % GEL Apply 1 Application topically 4 (four) times daily as needed. 05/11/23   Raulkar, Drema Pry, MD  DILT-XR 240 MG 24 hr capsule Take 1 capsule (240 mg total) by mouth daily. 06/27/23   Quintella Reichert, MD  diphenhydrAMINE 25 mg in sodium chloride 0.9 % 50 mL Take by mouth. 04/05/13   [provider]  doxycycline (VIBRA-TABS) 100 MG tablet Take 1 tablet (100 mg total) by mouth 2 (two) times daily. 08/01/23   Sharlene Dory, DO  EPINEPHrine 0.3 mg/0.3 mL IJ SOAJ injection Inject 0.3 mg into the muscle as needed for anaphylaxis. 09/07/22   Sharlene Dory, DO  fluconazole (DIFLUCAN) 150 MG tablet Take 1 tab, repeat in 72 hours if no improvement. 07/06/23   Sharlene Dory, DO  fluticasone (FLONASE) 50 MCG/ACT nasal spray USE 2 SPRAY(S) IN EACH NOSTRIL ONCE DAILY AS NEEDED FOR ALLERGIES OR  RHINITIS 04/09/23   Sharlene Dory, DO  furosemide (LASIX) 20 MG tablet Take 2 tablets (40 mg total) by mouth daily. 09/01/22   Sharlene Dory, DO  gabapentin (NEURONTIN) 400 MG capsule Take 1 capsule (400 mg total) by mouth 4 (four) times daily. TAKE 1 CAPSULE BY MOUTH IN THE MORNING AND 1 AT NOON AND 2 AT BEDTIME Strength: 400 mg 05/11/23   Raulkar, Drema Pry, MD  gentamicin ointment (GARAMYCIN) 0.1 % USE AS DIRECTED FOR WOUND CARE 07/23/23   [provider]  Glucagon, rDNA, (GLUCAGON EMERGENCY) 1 MG KIT Inject 1 mg into the vein daily as needed. 05/08/23   Sharlene Dory, DO  GLUCOMANNAN PO Take 1,995 mg by mouth daily.    [provider]  guaiFENesin (MUCINEX) 600 MG 12 hr tablet Take 600 mg by mouth 2 (two) times daily as needed for cough or to loosen phlegm.    [provider]  HUMULIN R 500 UNIT/ML injection INJECT 0.08-0.3 MLS (40-150 UNITS) INTO THE SKIN THREE TIMES DAILY WITH MEALS 08/31/23   Wendling, Jilda Roche, DO  hydrocortisone cream 1 % Apply 1 application topically 3  (three) times daily as needed for itching (minor skin irritation). 06/24/21   Love, Evlyn Kanner, PA-C  INSULIN SYRINGE 1CC/29G (B-D INSULIN SYRINGE) 29G X 1/2" 1 ML MISC Use three times daily with insulin.  DX E11.9 06/11/20   Sharlene Dory, DO  isosorbide mononitrate (IMDUR) 60 MG 24 hr tablet Take 1 tablet (60 mg total) by mouth daily. 04/30/23   Swinyer, Zachary George, NP  levocetirizine (XYZAL) 5 MG tablet TAKE 1 TABLET BY MOUTH ONCE  DAILY IN THE EVENING 07/16/23   Wendling, Jilda Roche, DO  levothyroxine (SYNTHROID) 200 MCG tablet Take 1 tablet (200 mcg total) by mouth daily before breakfast. 06/27/23   Wendling, Jilda Roche, DO  lidocaine (LIDODERM) 5 % Place 1 patch onto the skin every 12 (twelve) hours. Remove & Discard patch within 12 hours or as directed 04/12/23   Jones Bales, NP  lidocaine (XYLOCAINE) 5 % ointment Apply topically as directed. 02/02/23   [provider]  metFORMIN (GLUCOPHAGE-XR) 500 MG 24 hr tablet Take 2 tablets (1,000 mg total) by mouth 2 (two) times daily. 06/27/23   Sharlene Dory, DO  Multiple Vitamins-Minerals (WOMENS 50+ MULTI VITAMIN PO) Take 1 tablet by mouth daily.    [provider]  mupirocin ointment (BACTROBAN) 2 % Apply topically 2 (two) times daily. 05/08/23   Sharlene Dory, DO  naloxone [facility]) nasal spray 4 mg/0.1 mL Place 1 spray into the nose as needed (opoid overdose).    [provider]  nitroGLYCERIN (NITROSTAT) 0.4 MG SL tablet DISSOLVE ONE TABLET UNDER THE TONGUE EVERY 5 MINUTES AS NEEDED FOR CHEST PAIN.  DO NOT EXCEED A TOTAL OF 3 DOSES IN 15 MINUTES Patient taking differently: Place 0.4 mg under the tongue every 5 (five) minutes as needed for chest pain. 08/23/21   Quintella Reichert, MD  nystatin (MYCOSTATIN/NYSTOP) powder Apply 1 Application topically 3 (three) times daily. RLE Patient taking differently: Apply 1 Application topically 3 (three) times daily as needed (skin rash). RLE 03/17/22    Sharlene Dory, DO  omeprazole (PRILOSEC) 40 MG capsule Take 1 capsule by mouth once daily 08/24/23   Carmelia Roller, Jilda Roche, DO  OneTouch Delica Lancets 33G MISC Use to check blood glucose up to 4 times a day 11/16/22   Sharlene Dory, DO  [facility] VERIO test strip Use as instructed 06/27/23   Sharlene Dory, DO  Oxycodone HCl 20 MG TABS Take 1 tablet (20 mg total) by mouth every 8 (eight) hours as needed. 08/16/23   Jones Bales, NP  oxymetazoline (AFRIN) 0.05 % nasal spray Place 2 sprays into both nostrils 2 (two) times daily as needed for congestion.    [provider]  Polyethyl Glyc-Propyl Glyc PF (SYSTANE PRESERVATIVE FREE) 0.4-0.3 % SOLN Apply to eye. 05/11/16   [provider]  pramipexole (MIRAPEX) 1 MG tablet Take 1 tablet (1 mg total) by mouth 3 (three) times daily. 08/08/23   Saguier, Ramon Dredge, PA-C  pregabalin (LYRICA) 50 MG capsule Take 1 capsule (50 mg total) by mouth 3 (three) times daily. 05/11/23   Raulkar, Drema Pry, MD  promethazine (PHENERGAN) 25 MG tablet Take 1 tablet (25 mg total) by mouth every 8 (eight) hours as needed for vomiting or nausea. 04/20/23   Sharlene Dory, DO  saccharomyces boulardii (FLORASTOR) 250 MG capsule Take 250 mg by mouth 3 (three) times daily.    [provider]  Sodium Hyaluronate, oral, (HYALURONIC ACID PO)     [provider]  Vitamin D, Ergocalciferol, (DRISDOL) 1.25 MG (50000 UNIT) CAPS capsule TAKE 1 CAPSULE BY MOUTH ONCE A WEEK ON  FRIDAYS 06/27/23   Sharlene Dory, DO  Wound Dressings (ELTA DERMAL WOUND DRESSING EX) Apply topically.    [provider]      Allergies    Fish allergy, Fish oil, Gabapentin, Ipratropium-albuterol, Omega-3 fatty acids, Peanut-containing drug products, Victoza [liraglutide], Lovastatin, Metronidazole, Penicillins, Red yeast rice [cholestin], Rosuvastatin, Rotigotine, Atrovent hfa [ipratropium bromide hfa], Iodine,  Ipratropium  bromide, Morphine, Other, Pepto-bismol [bismuth subsalicylate], Wound dressing adhesive, Enalapril, Suprep [na sulfate-k sulfate-mg sulf], and Tape    Review of Systems   Review of Systems  Physical Exam Updated Vital Signs BP (!) 175/72 (BP Location: Left Arm)   Pulse 100   Temp 97.9 F (36.6 C) (Oral)   Resp 20   SpO2 95%  Physical Exam Vitals and nursing note reviewed.  Constitutional:      Appearance: She is obese.  HENT:     Head: Atraumatic.  Musculoskeletal:     Comments: Chronic edema of left lower extremity.  It is wrapped.  Does have some tenderness on the that appears to be chronic.  Some erythema.  Edema of right upper extremity.  Edema of right breast with more erythema.  Also on abdomen has chronic changes but more induration on the left lower abdomen.  No fluctuance.  Skin:    General: Skin is warm.  Neurological:     Mental Status: She is alert and oriented to person, place, and time.     ED Results / Procedures / Treatments   Labs (all labs ordered are listed, but only abnormal results are displayed) Labs Reviewed  CBC WITH DIFFERENTIAL/PLATELET  COMPREHENSIVE METABOLIC PANEL    EKG None  Radiology DG Chest Portable 1 View Result Date: 09/02/2023 CLINICAL DATA:  Shortness of breath EXAM: PORTABLE CHEST 1 VIEW COMPARISON:  05/26/2021 FINDINGS: Cardiomegaly. Vascular congestion. Right base atelectasis. No overt edema or effusions. No acute bony abnormality. IMPRESSION: Cardiomegaly, vascular congestion. Right base atelectasis. Electronically Signed   By: Charlett Nose M.D.   On: 09/02/2023 22:18   DG Knee Complete 4 Views Left Result Date: 09/02/2023 CLINICAL DATA:  Left knee pain. EXAM: LEFT KNEE - COMPLETE 4+ VIEW COMPARISON:  None Available. FINDINGS: There is no acute fracture or dislocation. The bones are osteopenic. Moderate narrowing of the medial compartment. No joint effusion. Diffuse subcutaneous edema. No radiopaque foreign object or soft tissue  gas. IMPRESSION: 1. No acute fracture or dislocation. 2. Moderate narrowing of the medial compartment. Electronically Signed   By: Elgie Collard M.D.   On: 09/02/2023 22:17    Procedures Procedures    Medications Ordered in ED Medications - No data to display  ED Course/ Medical Decision Making/ A&P                                 Medical Decision Making Amount and/or Complexity of Data Reviewed Labs: ordered. Radiology: ordered.   Patient with pain in left knee and right shoulder.  Somewhat acute on chronic without injury.  Previous right above-the-knee amputation.  Does have increasing induration on right breast with more swelling.  Also swelling of right upper extremity. with more erythema will treat for cellulitis.  Hopefully should be able to discharge home however.  Will get basic blood work to check for renal function.  Patient's husband states she has been urinating less recently.  She is on chronic pain meds.  Care turned over to Dr. Preston Fleeting.  Would treat with doxycycline if workup reassuring.        Final Clinical Impression(s) / ED Diagnoses Final diagnoses:  None    Rx / DC Orders ED Discharge Orders     None         Benjiman Core, MD 09/02/23 2317

## 2023-09-02 NOTE — ED Provider Notes (Signed)
Care assumed from Dr. Rubin Payor, patient with increase in chronic pain in left knee, swelling in right arm and breast. Labs are pending, will need antibiotics for possible cellulitis of the breast, outpatient venous doppler to rule uot DVT of right arm.  I have reviewed her laboratory tests, and my interpretation is microcytic anemia which has progressed compared with 07/05/2022, normal WBC and differential, hypokalemia and I have ordered a dose of oral potassium, low glucose level and I will order a capillary blood glucose to confirm this and plan to give her a snack.  I have ordered right upper extremity venous duplex scan to rule out DVT, care will need to be transferred to the oncoming physician.  I have ordered initial dose of cefadroxil to treat cellulitis.  Case is signed out to Dr. Karene Fry.    Dione Booze, MD 09/03/23 [phone]

## 2023-09-02 NOTE — ED Triage Notes (Signed)
BIBA per EMS w/ c/o right arm & knee pain. Swelling noted. Decreased mobility

## 2023-09-03 ENCOUNTER — Emergency Department (HOSPITAL_BASED_OUTPATIENT_CLINIC_OR_DEPARTMENT_OTHER): Payer: Commercial Managed Care - HMO

## 2023-09-03 ENCOUNTER — Encounter: Payer: Commercial Managed Care - HMO | Admitting: Registered Nurse

## 2023-09-03 DIAGNOSIS — M7989 Other specified soft tissue disorders: Secondary | ICD-10-CM

## 2023-09-03 LAB — CBC WITH DIFFERENTIAL/PLATELET
Abs Immature Granulocytes: 0.02 10*3/uL (ref 0.00–0.07)
Basophils Absolute: 0.1 10*3/uL (ref 0.0–0.1)
Basophils Relative: 1 %
Eosinophils Absolute: 0.3 10*3/uL (ref 0.0–0.5)
Eosinophils Relative: 4 %
HCT: 28.9 % — ABNORMAL LOW (ref 36.0–46.0)
Hemoglobin: 7.6 g/dL — ABNORMAL LOW (ref 12.0–15.0)
Immature Granulocytes: 0 %
Lymphocytes Relative: 21 %
Lymphs Abs: 1.8 10*3/uL (ref 0.7–4.0)
MCH: 17.6 pg — ABNORMAL LOW (ref 26.0–34.0)
MCHC: 26.3 g/dL — ABNORMAL LOW (ref 30.0–36.0)
MCV: 66.9 fL — ABNORMAL LOW (ref 80.0–100.0)
Monocytes Absolute: 1 10*3/uL (ref 0.1–1.0)
Monocytes Relative: 12 %
Neutro Abs: 5.2 10*3/uL (ref 1.7–7.7)
Neutrophils Relative %: 62 %
Platelets: 356 10*3/uL (ref 150–400)
RBC: 4.32 MIL/uL (ref 3.87–5.11)
RDW: 22.4 % — ABNORMAL HIGH (ref 11.5–15.5)
WBC: 8.3 10*3/uL (ref 4.0–10.5)
nRBC: 0 % (ref 0.0–0.2)

## 2023-09-03 LAB — COMPREHENSIVE METABOLIC PANEL
ALT: 20 U/L (ref 0–44)
AST: 29 U/L (ref 15–41)
Albumin: 2.9 g/dL — ABNORMAL LOW (ref 3.5–5.0)
Alkaline Phosphatase: 64 U/L (ref 38–126)
Anion gap: 9 (ref 5–15)
BUN: 10 mg/dL (ref 8–23)
CO2: 27 mmol/L (ref 22–32)
Calcium: 8.3 mg/dL — ABNORMAL LOW (ref 8.9–10.3)
Chloride: 104 mmol/L (ref 98–111)
Creatinine, Ser: 0.66 mg/dL (ref 0.44–1.00)
GFR, Estimated: >60 mL/min (ref >60)
Glucose, Bld: 48 mg/dL — ABNORMAL LOW (ref 70–99)
Potassium: 3.2 mmol/L — ABNORMAL LOW (ref 3.5–5.1)
Sodium: 140 mmol/L (ref 135–145)
Total Bilirubin: 0.4 mg/dL (ref <1.2)
Total Protein: 6.4 g/dL — ABNORMAL LOW (ref 6.5–8.1)

## 2023-09-03 LAB — CBG MONITORING, ED: Glucose-Capillary: 102 mg/dL — ABNORMAL HIGH (ref 70–99)

## 2023-09-03 LAB — HEMOGLOBIN AND HEMATOCRIT, BLOOD
HCT: 30.8 % — ABNORMAL LOW (ref 36.0–46.0)
Hemoglobin: 8.2 g/dL — ABNORMAL LOW (ref 12.0–15.0)

## 2023-09-03 MED ORDER — CEFADROXIL 500 MG PO CAPS: 500.0000 mg | ORAL_CAPSULE | Freq: Two times a day (BID) | ORAL | 0 refills | Status: DC

## 2023-09-03 MED ORDER — ALBUTEROL SULFATE (2.5 MG/3ML) 0.083% IN NEBU
2.5000 mg | INHALATION_SOLUTION | Freq: Once | RESPIRATORY_TRACT | Status: AC
  Administered 2023-09-03: 2.5 mg via RESPIRATORY_TRACT
  Filled 2023-09-03: qty 3

## 2023-09-03 MED ORDER — POTASSIUM CHLORIDE CRYS ER 20 MEQ PO TBCR
40.0000 meq | EXTENDED_RELEASE_TABLET | Freq: Once | ORAL | Status: AC
  Administered 2023-09-03: 40 meq via ORAL
  Filled 2023-09-03: qty 2

## 2023-09-03 MED ORDER — PRAMIPEXOLE DIHYDROCHLORIDE 1 MG PO TABS
1.0000 mg | ORAL_TABLET | Freq: Once | ORAL | Status: AC
Start: 2023-09-03 — End: 2023-09-03
  Administered 2023-09-03: 1 mg via ORAL
  Filled 2023-09-03: qty 1

## 2023-09-03 MED ORDER — GABAPENTIN 400 MG PO CAPS
800.0000 mg | ORAL_CAPSULE | Freq: Once | ORAL | Status: AC
Start: 2023-09-03 — End: 2023-09-03
  Administered 2023-09-03: 800 mg via ORAL
  Filled 2023-09-03: qty 2

## 2023-09-03 MED ORDER — OXYCODONE HCL 5 MG PO TABS
20.0000 mg | ORAL_TABLET | Freq: Once | ORAL | Status: AC
  Administered 2023-09-03: 20 mg via ORAL
  Filled 2023-09-03: qty 4

## 2023-09-03 MED ORDER — CEFADROXIL 500 MG PO CAPS
500.0000 mg | ORAL_CAPSULE | Freq: Two times a day (BID) | ORAL | Status: DC
  Administered 2023-09-03: 500 mg via ORAL
  Filled 2023-09-03 (×2): qty 1

## 2023-09-03 MED ORDER — PREGABALIN 50 MG PO CAPS
50.0000 mg | ORAL_CAPSULE | Freq: Once | ORAL | Status: AC
Start: 2023-09-03 — End: 2023-09-03
  Administered 2023-09-03: 50 mg via ORAL
  Filled 2023-09-03: qty 1

## 2023-09-03 NOTE — ED Notes (Addendum)
Pt informed EMT-P she was feeling short of breath. Pt presented with labored breathing. Pt asked if she could either use her inhaler or have a neb. Provider was informed and ordered neb treatment. After treatment was finished, pt breathing had notably improved, and pt stated she felt her breathing was much easier.

## 2023-09-03 NOTE — ED Provider Notes (Addendum)
  Physical Exam  BP 112/64   Pulse 100   Temp 97.9 F (36.6 C) (Oral)   Resp 20   SpO2 (!) 85%     Procedures  Procedures  ED Course / MDM    Medical Decision Making Amount and/or Complexity of Data Reviewed Labs: ordered. Radiology: ordered.  Risk Prescription drug management.   89F, presenting with arm pain, waiting on an upper extremity DVT study. Also concern for cellulitis of the right breast, started on ABX for this. Hgb down 2 pts from one year ago. Repeat H/H pending. Pending DVT US.   DVT US negative. Pt H/H rechecked and is closer to baseline at 8.2. No leukocytosis on labs and afebrile. Only mildly tachy post albuterol nebulizer treatment, lungs CTAB, vitally stable. Low concern for systemic infection. Denies rectal bleeding, melena or hematochezia. Potassium replenished overnight. Pt overall well appearing, vitally stable. Plan to DC on ABX, return precautions provided.    Ernie Avena, MD 09/03/23 1610    Ernie Avena, MD 09/03/23 1005

## 2023-09-03 NOTE — ED Notes (Signed)
Pt given juice, cheese, and crackers for PO challenge. Pt tolerated well.

## 2023-09-03 NOTE — ED Notes (Signed)
Calling [facility] for transport

## 2023-09-03 NOTE — Discharge Instructions (Addendum)
Please follow-up with your primary care physician. Return to the ED if you have any worsening symptoms. Take ABX for right breast cellulitis.

## 2023-09-04 ENCOUNTER — Ambulatory Visit (INDEPENDENT_AMBULATORY_CARE_PROVIDER_SITE_OTHER): Payer: Commercial Managed Care - HMO | Admitting: Family Medicine

## 2023-09-04 ENCOUNTER — Encounter: Payer: Self-pay | Admitting: Family Medicine

## 2023-09-04 ENCOUNTER — Ambulatory Visit: Payer: Commercial Managed Care - HMO | Admitting: Family Medicine

## 2023-09-04 VITALS — BP 138/62 | HR 116

## 2023-09-04 DIAGNOSIS — R82998 Other abnormal findings in urine: Secondary | ICD-10-CM

## 2023-09-04 DIAGNOSIS — D649 Anemia, unspecified: Secondary | ICD-10-CM | POA: Diagnosis not present

## 2023-09-04 DIAGNOSIS — D509 Iron deficiency anemia, unspecified: Secondary | ICD-10-CM

## 2023-09-04 DIAGNOSIS — E876 Hypokalemia: Secondary | ICD-10-CM | POA: Diagnosis not present

## 2023-09-04 DIAGNOSIS — L0390 Cellulitis, unspecified: Secondary | ICD-10-CM

## 2023-09-04 LAB — MAGNESIUM: Magnesium: 1.7 mg/dL (ref 1.5–2.5)

## 2023-09-04 LAB — BASIC METABOLIC PANEL
BUN: 12 mg/dL (ref 6–23)
CO2: 27 meq/L (ref 19–32)
Calcium: 8.5 mg/dL (ref 8.4–10.5)
Chloride: 101 meq/L (ref 96–112)
Creatinine, Ser: 0.83 mg/dL (ref 0.40–1.20)
GFR: 75.69 mL/min (ref >60.00)
Glucose, Bld: 150 mg/dL — ABNORMAL HIGH (ref 70–99)
Potassium: 4.1 meq/L (ref 3.5–5.1)
Sodium: 140 meq/L (ref 135–145)

## 2023-09-04 LAB — IBC + FERRITIN
Ferritin: 5.3 ng/mL — ABNORMAL LOW (ref 10.0–291.0)
Iron: 13 ug/dL — ABNORMAL LOW (ref 42–145)
Saturation Ratios: 2.4 % — ABNORMAL LOW (ref 20.0–50.0)
TIBC: 536.2 ug/dL — ABNORMAL HIGH (ref 250.0–450.0)
Transferrin: 383 mg/dL — ABNORMAL HIGH (ref 212.0–360.0)

## 2023-09-04 NOTE — Progress Notes (Signed)
Acute Office Visit  Subjective:     Patient ID: [name], female    DOB: 02-Mar-1961, 62 y.o.   MRN: [id number]  Chief Complaint  Patient presents with   Hospitalization Follow-up    HPI Patient is in today for ED follow-up.    Discussed the use of AI scribe software for clinical note transcription with the patient, who gave verbal consent to proceed.  History of Present Illness   The patient, with a history of cellulitis, edema, and Sjogren's syndrome, presents for an emergency room follow-up. She reports persistent swelling and redness in the knee and shoulder despite being on antibiotics for a day. The patient also expresses concern about recent lab results.  The patient has been experiencing skin irritation and itching due to the application of diclofenac for pain management, leading to unconscious scratching and subsequent sores. The sores are present on the leg and shoulder, with some drainage noted. The patient's calf has long-standing edema, leading to skin breakdown and weeping, managed with compression wrapping.  Additionally, the patient reports a lump in the breast, associated with a rash. The breast is described as firm, heavy, and slightly warm, with a small red rash. The patient has not had a mammogram in several years due to sores on the breast. ED recently started her on antibiotics for suspected cellulitis.   The patient also reports decreased urinary output, with the urine being very dark. She has not been taking prescribed Lasix regularly due to difficulty accessing the bathroom frequently.   The patient has a history of low potassium and iron levels, with recent hospital records indicating low potassium. She reports consuming large amounts of ice daily, a potential sign of low iron. The patient also experiences leg cramps, suggesting a possible deficiency in magnesium.  The patient is scheduled for a wound care appointment and a follow-up with her primary  care provider.             ROS All review of systems negative except what is listed in the HPI      Objective:    BP 138/62   Pulse (!) 116   SpO2 92%    Physical Exam Vitals reviewed.  Constitutional:      Appearance: Normal appearance.  Musculoskeletal:     Left lower leg: Edema present.  Skin:    Findings: Erythema and rash present.     Comments: Left breast wounds; right breast inflamed, erythema  Neurological:     General: No focal deficit present.     Mental Status: She is alert and oriented to person, place, and time. Mental status is at baseline.  Psychiatric:        Mood and Affect: Mood normal.        Behavior: Behavior normal.        Thought Content: Thought content normal.        Judgment: Judgment normal.          Results for orders placed or performed in visit on 09/04/23  Basic Metabolic Panel (BMET)  Result Value Ref Range   Sodium 140 135 - 145 mEq/L   Potassium 4.1 3.5 - 5.1 mEq/L   Chloride 101 96 - 112 mEq/L   CO2 27 19 - 32 mEq/L   Glucose, Bld 150 (H) 70 - 99 mg/dL   BUN 12 6 - 23 mg/dL   Creatinine, Ser 4.33 0.40 - 1.20 mg/dL   GFR 29.51 >88.41 mL/min   Calcium 8.5 8.4 -  10.5 mg/dL  IBC + Ferritin  Result Value Ref Range   Iron 13 (L) 42 - 145 ug/dL   Transferrin 130.8 (H) 212.0 - 360.0 mg/dL   Saturation Ratios 2.4 (L) 20.0 - 50.0 %   Ferritin 5.3 (L) 10.0 - 291.0 ng/mL   TIBC 536.2 (H) 250.0 - 450.0 mcg/dL  Magnesium  Result Value Ref Range   Magnesium 1.7 1.5 - 2.5 mg/dL        Assessment & Plan:   Problem List Items Addressed This Visit   None Visit Diagnoses       Anemia, unspecified type    -  Primary   Relevant Orders   IBC + Ferritin (Completed)     Hypokalemia       Relevant Orders   Basic Metabolic Panel (BMET) (Completed)   Magnesium (Completed)     Cellulitis, unspecified cellulitis site         Dark urine       Relevant Orders   Urinalysis w microscopic + reflex cultur          Cellulitis Recent ER visit for cellulitis in the leg and shoulder/breast. Currently on antibiotics x 1 day with some improvement.  -Continue antibiotics and monitor for improvement. -Keep upcoming wound clinic appointment -Once infection is under control, consider mammogram for the inflamed breast if not improved - discuss with PCP at visit next week  Edema Chronic edema in the calf with skin breakdown and weeping. Not currently taking Lasix due to difficulty with frequent urination. -Encourage use of a female urinal to facilitate urination and enable resumption of Lasix. -Continue compression wrap. -Follow-up with wound care and PCP   Iron Deficiency History of low iron and current symptoms of ice craving. Recent low H&H in ED -Check iron levels.  Hypokalemia Recent low potassium level noted in the ER. -Check potassium level.  Urinary Changes Noted dark urine and low output. -Provide urine collection kit for urinalysis and culture.        No orders of the defined types were placed in this encounter.   Return for keep upcoming PCP appointment for next week .  Clayborne Dana, NP

## 2023-09-06 NOTE — Addendum Note (Signed)
Addended by: Hyman Hopes B on: 09/06/2023 02:27 PM   Modules accepted: Orders

## 2023-09-10 ENCOUNTER — Emergency Department (HOSPITAL_COMMUNITY): Payer: Commercial Managed Care - HMO

## 2023-09-10 ENCOUNTER — Encounter (HOSPITAL_COMMUNITY): Payer: Self-pay

## 2023-09-10 ENCOUNTER — Inpatient Hospital Stay (HOSPITAL_COMMUNITY)
Admission: EM | Admit: 2023-09-10 | Discharge: 2023-10-01 | DRG: 871 | Disposition: A | Discharge status: Home Health | Payer: Commercial Managed Care - HMO | Attending: Internal Medicine | Admitting: Internal Medicine

## 2023-09-10 ENCOUNTER — Other Ambulatory Visit: Payer: Self-pay

## 2023-09-10 ENCOUNTER — Ambulatory Visit: Payer: Self-pay | Admitting: Family Medicine

## 2023-09-10 ENCOUNTER — Ambulatory Visit: Payer: Commercial Managed Care - HMO | Admitting: Family Medicine

## 2023-09-10 ENCOUNTER — Telehealth (INDEPENDENT_AMBULATORY_CARE_PROVIDER_SITE_OTHER): Payer: Commercial Managed Care - HMO | Admitting: Family Medicine

## 2023-09-10 VITALS — Ht 62.5 in | Wt 292.0 lb

## 2023-09-10 DIAGNOSIS — Z7901 Long term (current) use of anticoagulants: Secondary | ICD-10-CM

## 2023-09-10 DIAGNOSIS — Z89511 Acquired absence of right leg below knee: Secondary | ICD-10-CM

## 2023-09-10 DIAGNOSIS — J9601 Acute respiratory failure with hypoxia: Secondary | ICD-10-CM | POA: Diagnosis present

## 2023-09-10 DIAGNOSIS — M940 Chondrocostal junction syndrome [Tietze]: Secondary | ICD-10-CM | POA: Diagnosis present

## 2023-09-10 DIAGNOSIS — S3140XA Unspecified open wound of vagina and vulva, initial encounter: Secondary | ICD-10-CM

## 2023-09-10 DIAGNOSIS — E871 Hypo-osmolality and hyponatremia: Secondary | ICD-10-CM | POA: Diagnosis present

## 2023-09-10 DIAGNOSIS — Z6841 Body Mass Index (BMI) 40.0 and over, adult: Secondary | ICD-10-CM | POA: Diagnosis not present

## 2023-09-10 DIAGNOSIS — L0390 Cellulitis, unspecified: Secondary | ICD-10-CM | POA: Diagnosis present

## 2023-09-10 DIAGNOSIS — I2721 Secondary pulmonary arterial hypertension: Secondary | ICD-10-CM | POA: Diagnosis present

## 2023-09-10 DIAGNOSIS — N61 Mastitis without abscess: Secondary | ICD-10-CM

## 2023-09-10 DIAGNOSIS — E8729 Other acidosis: Secondary | ICD-10-CM | POA: Diagnosis present

## 2023-09-10 DIAGNOSIS — Z89611 Acquired absence of right leg above knee: Secondary | ICD-10-CM

## 2023-09-10 DIAGNOSIS — I5031 Acute diastolic (congestive) heart failure: Secondary | ICD-10-CM | POA: Diagnosis not present

## 2023-09-10 DIAGNOSIS — E662 Morbid (severe) obesity with alveolar hypoventilation: Secondary | ICD-10-CM | POA: Diagnosis present

## 2023-09-10 DIAGNOSIS — J9621 Acute and chronic respiratory failure with hypoxia: Secondary | ICD-10-CM | POA: Diagnosis not present

## 2023-09-10 DIAGNOSIS — E11621 Type 2 diabetes mellitus with foot ulcer: Secondary | ICD-10-CM | POA: Diagnosis present

## 2023-09-10 DIAGNOSIS — L97428 Non-pressure chronic ulcer of left heel and midfoot with other specified severity: Secondary | ICD-10-CM | POA: Diagnosis present

## 2023-09-10 DIAGNOSIS — G894 Chronic pain syndrome: Secondary | ICD-10-CM | POA: Diagnosis present

## 2023-09-10 DIAGNOSIS — S91302A Unspecified open wound, left foot, initial encounter: Secondary | ICD-10-CM | POA: Diagnosis not present

## 2023-09-10 DIAGNOSIS — I2729 Other secondary pulmonary hypertension: Secondary | ICD-10-CM | POA: Diagnosis present

## 2023-09-10 DIAGNOSIS — B3731 Acute candidiasis of vulva and vagina: Secondary | ICD-10-CM

## 2023-09-10 DIAGNOSIS — E1165 Type 2 diabetes mellitus with hyperglycemia: Secondary | ICD-10-CM | POA: Diagnosis present

## 2023-09-10 DIAGNOSIS — M351 Other overlap syndromes: Secondary | ICD-10-CM | POA: Diagnosis present

## 2023-09-10 DIAGNOSIS — L03313 Cellulitis of chest wall: Secondary | ICD-10-CM | POA: Diagnosis present

## 2023-09-10 DIAGNOSIS — I2510 Atherosclerotic heart disease of native coronary artery without angina pectoris: Secondary | ICD-10-CM

## 2023-09-10 DIAGNOSIS — S31109A Unspecified open wound of abdominal wall, unspecified quadrant without penetration into peritoneal cavity, initial encounter: Secondary | ICD-10-CM

## 2023-09-10 DIAGNOSIS — J4520 Mild intermittent asthma, uncomplicated: Secondary | ICD-10-CM | POA: Diagnosis present

## 2023-09-10 DIAGNOSIS — Z5986 Financial insecurity: Secondary | ICD-10-CM

## 2023-09-10 DIAGNOSIS — S21002A Unspecified open wound of left breast, initial encounter: Secondary | ICD-10-CM

## 2023-09-10 DIAGNOSIS — I5033 Acute on chronic diastolic (congestive) heart failure: Secondary | ICD-10-CM | POA: Diagnosis not present

## 2023-09-10 DIAGNOSIS — Z7982 Long term (current) use of aspirin: Secondary | ICD-10-CM

## 2023-09-10 DIAGNOSIS — E039 Hypothyroidism, unspecified: Secondary | ICD-10-CM | POA: Diagnosis present

## 2023-09-10 DIAGNOSIS — L89152 Pressure ulcer of sacral region, stage 2: Secondary | ICD-10-CM | POA: Diagnosis present

## 2023-09-10 DIAGNOSIS — R238 Other skin changes: Secondary | ICD-10-CM

## 2023-09-10 DIAGNOSIS — K589 Irritable bowel syndrome without diarrhea: Secondary | ICD-10-CM | POA: Diagnosis present

## 2023-09-10 DIAGNOSIS — Z91048 Other nonmedicinal substance allergy status: Secondary | ICD-10-CM

## 2023-09-10 DIAGNOSIS — J9811 Atelectasis: Secondary | ICD-10-CM | POA: Diagnosis present

## 2023-09-10 DIAGNOSIS — Z993 Dependence on wheelchair: Secondary | ICD-10-CM

## 2023-09-10 DIAGNOSIS — I480 Paroxysmal atrial fibrillation: Secondary | ICD-10-CM | POA: Diagnosis not present

## 2023-09-10 DIAGNOSIS — Z794 Long term (current) use of insulin: Secondary | ICD-10-CM | POA: Diagnosis not present

## 2023-09-10 DIAGNOSIS — N179 Acute kidney failure, unspecified: Secondary | ICD-10-CM | POA: Diagnosis not present

## 2023-09-10 DIAGNOSIS — D509 Iron deficiency anemia, unspecified: Secondary | ICD-10-CM | POA: Diagnosis present

## 2023-09-10 DIAGNOSIS — I110 Hypertensive heart disease with heart failure: Secondary | ICD-10-CM | POA: Diagnosis present

## 2023-09-10 DIAGNOSIS — Z91018 Allergy to other foods: Secondary | ICD-10-CM

## 2023-09-10 DIAGNOSIS — E785 Hyperlipidemia, unspecified: Secondary | ICD-10-CM | POA: Diagnosis present

## 2023-09-10 DIAGNOSIS — K219 Gastro-esophageal reflux disease without esophagitis: Secondary | ICD-10-CM | POA: Diagnosis not present

## 2023-09-10 DIAGNOSIS — J9622 Acute and chronic respiratory failure with hypercapnia: Secondary | ICD-10-CM | POA: Diagnosis present

## 2023-09-10 DIAGNOSIS — Z91041 Radiographic dye allergy status: Secondary | ICD-10-CM

## 2023-09-10 DIAGNOSIS — M069 Rheumatoid arthritis, unspecified: Secondary | ICD-10-CM | POA: Diagnosis present

## 2023-09-10 DIAGNOSIS — Z9861 Coronary angioplasty status: Secondary | ICD-10-CM | POA: Diagnosis not present

## 2023-09-10 DIAGNOSIS — Z7984 Long term (current) use of oral hypoglycemic drugs: Secondary | ICD-10-CM

## 2023-09-10 DIAGNOSIS — L03116 Cellulitis of left lower limb: Secondary | ICD-10-CM | POA: Diagnosis not present

## 2023-09-10 DIAGNOSIS — Z91199 Patient's noncompliance with other medical treatment and regimen due to unspecified reason: Secondary | ICD-10-CM

## 2023-09-10 DIAGNOSIS — E875 Hyperkalemia: Secondary | ICD-10-CM | POA: Diagnosis present

## 2023-09-10 DIAGNOSIS — M797 Fibromyalgia: Secondary | ICD-10-CM | POA: Diagnosis present

## 2023-09-10 DIAGNOSIS — Z5982 Transportation insecurity: Secondary | ICD-10-CM

## 2023-09-10 DIAGNOSIS — Z91013 Allergy to seafood: Secondary | ICD-10-CM

## 2023-09-10 DIAGNOSIS — A419 Sepsis, unspecified organism: Secondary | ICD-10-CM | POA: Diagnosis present

## 2023-09-10 DIAGNOSIS — Z823 Family history of stroke: Secondary | ICD-10-CM

## 2023-09-10 DIAGNOSIS — Z79890 Hormone replacement therapy (postmenopausal): Secondary | ICD-10-CM

## 2023-09-10 DIAGNOSIS — Z880 Allergy status to penicillin: Secondary | ICD-10-CM

## 2023-09-10 DIAGNOSIS — B379 Candidiasis, unspecified: Secondary | ICD-10-CM | POA: Diagnosis present

## 2023-09-10 DIAGNOSIS — E878 Other disorders of electrolyte and fluid balance, not elsewhere classified: Secondary | ICD-10-CM | POA: Diagnosis present

## 2023-09-10 DIAGNOSIS — Z8249 Family history of ischemic heart disease and other diseases of the circulatory system: Secondary | ICD-10-CM

## 2023-09-10 DIAGNOSIS — Z91010 Allergy to peanuts: Secondary | ICD-10-CM

## 2023-09-10 DIAGNOSIS — Z91148 Patient's other noncompliance with medication regimen for other reason: Secondary | ICD-10-CM

## 2023-09-10 DIAGNOSIS — E66813 Obesity, class 3: Secondary | ICD-10-CM | POA: Diagnosis present

## 2023-09-10 DIAGNOSIS — Z79899 Other long term (current) drug therapy: Secondary | ICD-10-CM

## 2023-09-10 DIAGNOSIS — Z825 Family history of asthma and other chronic lower respiratory diseases: Secondary | ICD-10-CM

## 2023-09-10 DIAGNOSIS — M109 Gout, unspecified: Secondary | ICD-10-CM | POA: Diagnosis present

## 2023-09-10 DIAGNOSIS — M419 Scoliosis, unspecified: Secondary | ICD-10-CM | POA: Diagnosis present

## 2023-09-10 DIAGNOSIS — Z955 Presence of coronary angioplasty implant and graft: Secondary | ICD-10-CM

## 2023-09-10 DIAGNOSIS — I5032 Chronic diastolic (congestive) heart failure: Secondary | ICD-10-CM

## 2023-09-10 DIAGNOSIS — E876 Hypokalemia: Secondary | ICD-10-CM | POA: Insufficient documentation

## 2023-09-10 DIAGNOSIS — Z885 Allergy status to narcotic agent status: Secondary | ICD-10-CM

## 2023-09-10 DIAGNOSIS — Z87891 Personal history of nicotine dependence: Secondary | ICD-10-CM

## 2023-09-10 DIAGNOSIS — Z888 Allergy status to other drugs, medicaments and biological substances status: Secondary | ICD-10-CM

## 2023-09-10 DIAGNOSIS — E0781 Sick-euthyroid syndrome: Secondary | ICD-10-CM | POA: Diagnosis present

## 2023-09-10 LAB — CBC WITH DIFFERENTIAL/PLATELET
Abs Immature Granulocytes: 0.04 10*3/uL (ref 0.00–0.07)
Basophils Absolute: 0.1 10*3/uL (ref 0.0–0.1)
Basophils Relative: 1 %
Eosinophils Absolute: 0.3 10*3/uL (ref 0.0–0.5)
Eosinophils Relative: 3 %
HCT: 32.1 % — ABNORMAL LOW (ref 36.0–46.0)
Hemoglobin: 8.2 g/dL — ABNORMAL LOW (ref 12.0–15.0)
Immature Granulocytes: 0 %
Lymphocytes Relative: 17 %
Lymphs Abs: 1.7 10*3/uL (ref 0.7–4.0)
MCH: 17.3 pg — ABNORMAL LOW (ref 26.0–34.0)
MCHC: 25.5 g/dL — ABNORMAL LOW (ref 30.0–36.0)
MCV: 67.6 fL — ABNORMAL LOW (ref 80.0–100.0)
Monocytes Absolute: 1 10*3/uL (ref 0.1–1.0)
Monocytes Relative: 10 %
Neutro Abs: 7 10*3/uL (ref 1.7–7.7)
Neutrophils Relative %: 69 %
Platelets: 324 10*3/uL (ref 150–400)
RBC: 4.75 MIL/uL (ref 3.87–5.11)
RDW: 23.1 % — ABNORMAL HIGH (ref 11.5–15.5)
WBC: 10.1 10*3/uL (ref 4.0–10.5)
nRBC: 0.2 % (ref 0.0–0.2)

## 2023-09-10 LAB — MAGNESIUM: Magnesium: 2 mg/dL (ref 1.7–2.4)

## 2023-09-10 LAB — COMPREHENSIVE METABOLIC PANEL
ALT: 23 U/L (ref 0–44)
AST: 32 U/L (ref 15–41)
Albumin: 3.2 g/dL — ABNORMAL LOW (ref 3.5–5.0)
Alkaline Phosphatase: 88 U/L (ref 38–126)
Anion gap: 14 (ref 5–15)
BUN: 13 mg/dL (ref 8–23)
CO2: 22 mmol/L (ref 22–32)
Calcium: 9.1 mg/dL (ref 8.9–10.3)
Chloride: 104 mmol/L (ref 98–111)
Creatinine, Ser: 0.8 mg/dL (ref 0.44–1.00)
GFR, Estimated: >60 mL/min (ref >60)
Glucose, Bld: 180 mg/dL — ABNORMAL HIGH (ref 70–99)
Potassium: 4.6 mmol/L (ref 3.5–5.1)
Sodium: 140 mmol/L (ref 135–145)
Total Bilirubin: 0.6 mg/dL (ref 0.0–1.2)
Total Protein: 7.2 g/dL (ref 6.5–8.1)

## 2023-09-10 LAB — GLUCOSE, CAPILLARY: Glucose-Capillary: 155 mg/dL — ABNORMAL HIGH (ref 70–99)

## 2023-09-10 LAB — RESP PANEL BY RT-PCR (RSV, FLU A&B, COVID)  RVPGX2
Influenza A by PCR: NEGATIVE
Influenza B by PCR: NEGATIVE
Resp Syncytial Virus by PCR: NEGATIVE
SARS Coronavirus 2 by RT PCR: NEGATIVE

## 2023-09-10 LAB — TSH: TSH: 5.692 u[IU]/mL — ABNORMAL HIGH (ref 0.350–4.500)

## 2023-09-10 LAB — CK: Total CK: 121 U/L (ref 38–234)

## 2023-09-10 LAB — TROPONIN I (HIGH SENSITIVITY): Troponin I (High Sensitivity): 17 ng/L (ref <18)

## 2023-09-10 MED ORDER — ALBUTEROL SULFATE (2.5 MG/3ML) 0.083% IN NEBU
2.5000 mg | INHALATION_SOLUTION | RESPIRATORY_TRACT | Status: DC | PRN
  Administered 2023-09-11 – 2023-09-13 (×5): 2.5 mg via RESPIRATORY_TRACT
  Filled 2023-09-10 (×5): qty 3

## 2023-09-10 MED ORDER — ENOXAPARIN SODIUM 80 MG/0.8ML IJ SOSY: 65.0000 mg | PREFILLED_SYRINGE | INTRAMUSCULAR | Status: DC

## 2023-09-10 MED ORDER — SODIUM CHLORIDE 0.9% FLUSH
3.0000 mL | Freq: Two times a day (BID) | INTRAVENOUS | Status: DC
  Administered 2023-09-10 – 2023-09-25 (×25): 3 mL via INTRAVENOUS

## 2023-09-10 MED ORDER — INSULIN REGULAR HUMAN (CONC) 500 UNIT/ML ~~LOC~~ SOLN: 50.0000 [IU] | Freq: Three times a day (TID) | SUBCUTANEOUS | Status: DC

## 2023-09-10 MED ORDER — ACETAMINOPHEN 650 MG RE SUPP: 650.0000 mg | Freq: Four times a day (QID) | RECTAL | Status: DC | PRN

## 2023-09-10 MED ORDER — GABAPENTIN 400 MG PO CAPS
400.0000 mg | ORAL_CAPSULE | Freq: Two times a day (BID) | ORAL | Status: DC
  Administered 2023-09-11 – 2023-10-01 (×41): 400 mg via ORAL
  Filled 2023-09-10 (×4): qty 1
  Filled 2023-09-10: qty 4
  Filled 2023-09-10 (×36): qty 1

## 2023-09-10 MED ORDER — PREGABALIN 25 MG PO CAPS
50.0000 mg | ORAL_CAPSULE | Freq: Three times a day (TID) | ORAL | Status: DC
  Administered 2023-09-11 – 2023-10-01 (×59): 50 mg via ORAL
  Filled 2023-09-10 (×59): qty 2

## 2023-09-10 MED ORDER — PANTOPRAZOLE SODIUM 40 MG PO TBEC
40.0000 mg | DELAYED_RELEASE_TABLET | Freq: Every day | ORAL | Status: DC
  Administered 2023-09-11 – 2023-10-01 (×21): 40 mg via ORAL
  Filled 2023-09-10 (×21): qty 1

## 2023-09-10 MED ORDER — LACTATED RINGERS IV BOLUS
1000.0000 mL | Freq: Once | INTRAVENOUS | Status: AC
  Administered 2023-09-10: 1000 mL via INTRAVENOUS

## 2023-09-10 MED ORDER — FLUCONAZOLE 150 MG PO TABS: ORAL_TABLET | ORAL | 0 refills | Status: DC

## 2023-09-10 MED ORDER — SODIUM CHLORIDE 0.9 % IV SOLN
2.0000 g | Freq: Once | INTRAVENOUS | Status: AC
  Administered 2023-09-10: 2 g via INTRAVENOUS
  Filled 2023-09-10: qty 20

## 2023-09-10 MED ORDER — LEVOTHYROXINE SODIUM 100 MCG PO TABS
200.0000 ug | ORAL_TABLET | Freq: Every day | ORAL | Status: DC
  Administered 2023-09-11 – 2023-10-01 (×21): 200 ug via ORAL
  Filled 2023-09-10 (×21): qty 2

## 2023-09-10 MED ORDER — VANCOMYCIN HCL 10 G IV SOLR
1750.0000 mg | INTRAVENOUS | Status: DC
  Filled 2023-09-10 (×3): qty 17.5

## 2023-09-10 MED ORDER — FLUTICASONE PROPIONATE 50 MCG/ACT NA SUSP
1.0000 | Freq: Every day | NASAL | Status: DC
  Administered 2023-09-11 – 2023-10-01 (×21): 1 via NASAL
  Filled 2023-09-10: qty 16

## 2023-09-10 MED ORDER — FLUCONAZOLE 100 MG PO TABS
100.0000 mg | ORAL_TABLET | Freq: Every day | ORAL | Status: DC
  Administered 2023-09-11: 100 mg via ORAL
  Filled 2023-09-10: qty 1

## 2023-09-10 MED ORDER — INSULIN ASPART 100 UNIT/ML IJ SOLN
0.0000 [IU] | Freq: Three times a day (TID) | INTRAMUSCULAR | Status: DC
  Administered 2023-09-11: 4 [IU] via SUBCUTANEOUS
  Administered 2023-09-11: 11 [IU] via SUBCUTANEOUS
  Administered 2023-09-11: 4 [IU] via SUBCUTANEOUS
  Administered 2023-09-12: 7 [IU] via SUBCUTANEOUS
  Administered 2023-09-12: 11 [IU] via SUBCUTANEOUS
  Administered 2023-09-12: 7 [IU] via SUBCUTANEOUS
  Administered 2023-09-13: 4 [IU] via SUBCUTANEOUS
  Administered 2023-09-13 – 2023-09-14 (×3): 7 [IU] via SUBCUTANEOUS
  Administered 2023-09-14 – 2023-09-15 (×3): 4 [IU] via SUBCUTANEOUS
  Administered 2023-09-15: 7 [IU] via SUBCUTANEOUS
  Administered 2023-09-15: 4 [IU] via SUBCUTANEOUS
  Administered 2023-09-16 (×3): 7 [IU] via SUBCUTANEOUS
  Administered 2023-09-17: 4 [IU] via SUBCUTANEOUS
  Administered 2023-09-17 (×2): 7 [IU] via SUBCUTANEOUS
  Administered 2023-09-18 – 2023-09-19 (×5): 4 [IU] via SUBCUTANEOUS
  Administered 2023-09-19: 7 [IU] via SUBCUTANEOUS
  Administered 2023-09-20: 4 [IU] via SUBCUTANEOUS
  Administered 2023-09-20: 7 [IU] via SUBCUTANEOUS
  Administered 2023-09-20: 4 [IU] via SUBCUTANEOUS

## 2023-09-10 MED ORDER — ASPIRIN 81 MG PO TBEC
81.0000 mg | DELAYED_RELEASE_TABLET | Freq: Every day | ORAL | Status: DC
  Administered 2023-09-11 – 2023-09-19 (×9): 81 mg via ORAL
  Filled 2023-09-10 (×9): qty 1

## 2023-09-10 MED ORDER — ISOSORBIDE MONONITRATE ER 60 MG PO TB24
60.0000 mg | ORAL_TABLET | Freq: Every day | ORAL | Status: DC
  Administered 2023-09-11 – 2023-09-13 (×3): 60 mg via ORAL
  Filled 2023-09-10 (×3): qty 1

## 2023-09-10 MED ORDER — GUAIFENESIN ER 600 MG PO TB12
600.0000 mg | ORAL_TABLET | Freq: Two times a day (BID) | ORAL | Status: DC | PRN
  Administered 2023-09-14: 600 mg via ORAL
  Filled 2023-09-10: qty 1

## 2023-09-10 MED ORDER — PRAMIPEXOLE DIHYDROCHLORIDE 1 MG PO TABS
1.0000 mg | ORAL_TABLET | Freq: Three times a day (TID) | ORAL | Status: DC
  Administered 2023-09-11 – 2023-10-01 (×59): 1 mg via ORAL
  Filled 2023-09-10 (×65): qty 1

## 2023-09-10 MED ORDER — LACTATED RINGERS IV SOLN
150.0000 mL/h | INTRAVENOUS | Status: DC
  Administered 2023-09-10 – 2023-09-11 (×3): 150 mL/h via INTRAVENOUS

## 2023-09-10 MED ORDER — NYSTATIN 100000 UNIT/GM EX POWD: 1.0000 | Freq: Three times a day (TID) | CUTANEOUS | 2 refills | Status: DC

## 2023-09-10 MED ORDER — INSULIN ASPART 100 UNIT/ML IJ SOLN
0.0000 [IU] | Freq: Every day | INTRAMUSCULAR | Status: DC
  Administered 2023-09-11 – 2023-09-13 (×3): 2 [IU] via SUBCUTANEOUS

## 2023-09-10 MED ORDER — ONDANSETRON HCL 4 MG PO TABS
4.0000 mg | ORAL_TABLET | Freq: Four times a day (QID) | ORAL | Status: DC | PRN
  Administered 2023-09-20 – 2023-09-22 (×3): 4 mg via ORAL
  Filled 2023-09-10 (×4): qty 1

## 2023-09-10 MED ORDER — VANCOMYCIN HCL IN DEXTROSE 1-5 GM/200ML-% IV SOLN
1000.0000 mg | INTRAVENOUS | Status: AC
  Administered 2023-09-10 (×2): 1000 mg via INTRAVENOUS
  Filled 2023-09-10 (×2): qty 200

## 2023-09-10 MED ORDER — FUROSEMIDE 40 MG PO TABS: 40.0000 mg | ORAL_TABLET | Freq: Every day | ORAL | Status: DC

## 2023-09-10 MED ORDER — ACETAMINOPHEN 325 MG PO TABS
650.0000 mg | ORAL_TABLET | Freq: Four times a day (QID) | ORAL | Status: DC | PRN
  Administered 2023-09-15 – 2023-09-22 (×5): 650 mg via ORAL
  Filled 2023-09-10 (×5): qty 2

## 2023-09-10 MED ORDER — VANCOMYCIN HCL IN DEXTROSE 1-5 GM/200ML-% IV SOLN: 1000.0000 mg | Freq: Once | INTRAVENOUS | Status: DC

## 2023-09-10 MED ORDER — ONDANSETRON HCL 4 MG/2ML IJ SOLN
4.0000 mg | Freq: Four times a day (QID) | INTRAMUSCULAR | Status: DC | PRN
  Administered 2023-09-18 – 2023-09-21 (×6): 4 mg via INTRAVENOUS
  Filled 2023-09-10 (×6): qty 2

## 2023-09-10 NOTE — ED Notes (Signed)
.ED TO INPATIENT HANDOFF REPORT  Name/Age/Gender [name] 62 y.o. female  Code Status    Code Status Orders  (From admission, onward)           Start     Ordered   09/10/23 2000  Full code  Continuous       Question:  By:  Answer:  Consent: discussion documented in EHR   09/10/23 2001           Code Status History     Date Active Date Inactive Code Status Order ID Comments User Context   06/15/2022 0118 06/20/2022 1855 Full Code [id number]  Darlin Drop, DO Inpatient   12/20/2021 1810 12/27/2021 2055 Full Code [id number]  Emeline General, MD ED   07/22/2021 1729 07/29/2021 1828 Full Code [id number]  [name], MD Inpatient   06/29/2021 1301 07/01/2021 1859 Full Code [id number]  [name], MD Inpatient   06/08/2021 2315 06/24/2021 2134 Full Code [id number]  Jacquelynn Cree, PA-C Inpatient   05/27/2021 1654 06/08/2021 2240 Full Code [id number]  Orland Mustard, MD Inpatient   01/28/2020 1424 02/04/2020 2334 Full Code [id number]  Persons, [place], [place] Inpatient   10/17/2018 2312 10/21/2018 2005 Full Code [id number]  Marcelo Baldy, MD Inpatient   04/18/2018 1436 04/19/2018 1353 Full Code [id number]  [name], MD Inpatient   01/02/2018 1724 01/06/2018 2024 Full Code [id number]  Merlene Laughter, DO Inpatient   04/29/2016 0125 05/04/2016 2009 Full Code [id number]  Alberteen Sam, MD Inpatient   03/09/2016 1824 03/11/2016 1843 Full Code [id number]  Leroy Sea, MD Inpatient   02/11/2015 0158 02/13/2015 1311 Full Code [id number]  Lorretta Harp, MD Inpatient   11/11/2012 2156 11/14/2012 1725 Full Code [id number]  Eduard Clos, MD Inpatient       Home/SNF/Other Home  Chief Complaint Cellulitis [L03.90]  Level of Care/Admitting Diagnosis ED Disposition     ED Disposition  Admit   Condition  --   Comment  Hospital Area: [facility] [[id number]]  Level of Care: Med-Surg [16]  May admit patient to [facility] or [facility] if equivalent  level of care is available:: Yes  Covid Evaluation: Confirmed COVID Negative  Diagnosis: Cellulitis [062694]  Admitting Physician: Hughie Closs [[id number]]  Attending Physician: Hughie Closs [phone]  Certification:: I certify this patient will need inpatient services for at least 2 midnights  Expected Medical Readiness: 09/12/2023          Medical History Past Medical History:  Diagnosis Date   Aortic stenosis    mild AS by echo 02/2021   Asthma    "on daily RX and rescue inhaler" (04/18/2018)   Chronic diastolic CHF (congestive heart failure) (HCC) 09/2015   Chronic lower back pain    Chronic neck pain    Chronic pain syndrome    Fentanyl Patch   Colon polyps    Coronary artery disease    cath with normal LM, 30% LAD, 85% mid RCA and 95% distal RCA s/p PCI of the mid to distal RCA and now on DAPT with ASA and Ticagrelor.     Eczema    Excessive daytime sleepiness 11/26/2015   Fibromyalgia    Gallstones    GERD (gastroesophageal reflux disease)    Heart murmur    "noted for the 1st time on 04/18/2018"   History of blood transfusion 07/2010   "S/P oophorectomy"   History of gout    History of hiatal hernia  50s   "gone now" (04/18/2018)   Hyperlipidemia    Hypertension    takes Metoprolol and Enalapril daily   Hypothyroidism    takes Synthroid daily   IBS (irritable bowel syndrome)    Migraine    "nothing in the 2000s" (04/18/2018)   Mixed connective tissue disease (HCC)    NAFLD (nonalcoholic fatty liver disease)    Pneumonia    "several times" (04/18/2018)   PVC's (premature ventricular contractions)    noted on event monitor 02/2021   Rheumatoid arthritis (HCC)    "hands, elbows, shoulders, probably knees" (04/18/2018)   Scoliosis    Sjogren's syndrome (HCC)    Sleep apnea    mild - does not use cpap   Spondylosis    Type II diabetes mellitus (HCC)    takes Metformin and Hum R daily (04/18/2018)   Walker as ambulation aid    also uses wheelchair     Allergies Allergies  Allergen Reactions   Fish Allergy Anaphylaxis    INCLUDES OMEGA 3 OILS   Fish Oil Anaphylaxis and Swelling    THROAT SWELLS INCLUDES FISH AS A CLASS   Gabapentin Anxiety and Other (See Comments)    Anxiety on high doses  Other Reaction(s): Angioedema  Other reaction(s): Other (See Comments), Anxiety on high doses, Other reaction(s): Other (See Comments), Anxiety on high doses, Other reaction(s): Other (See Comments), Anxiety on high doses, Anxiety on high doses   Ipratropium-Albuterol Shortness Of Breath    Other Reaction(s): Bronchospasm, Other (See Comments)  Wheezing becomes worse, can take albuterol with ipratropium, Other reaction(s): Other (See Comments), Wheezing becomes worse, can take albuterol with ipratropium, wheezing, wheezing   Omega-3 Fatty Acids Swelling    Throat swelling   Has tolerated Glucerna nutritional drinks  Throat swelling   Peanut-Containing Drug Products Shortness Of Breath   Victoza [Liraglutide] Nausea And Vomiting    Patient reported severe, debilitating nausea   Lovastatin Other (See Comments)    EXTREME JOINT PAIN   Metronidazole Nausea Only and Swelling    Headache and shakes  Nausea and vomiting  Pt tolerating IV 06/14/22  Other Reaction(s): Other (See Comments)  Headache and shakes, Nausea and vomiting   Penicillins Other (See Comments) and Rash    UNSPECIFIED, TOLERATES KEFLEX   CHILDHOOD RX  PCN CHALLENGE PER ALLERGIST, 02/29/16.  RESULTS NOT UPDATED.  Other Reaction(s): Other (See Comments)  Other reaction(s): Other (See Comments), Unknown childhood reaction, Childhood allergy, Unknown childhood reaction; Tolerates Keflex.  , To undergo PCN challenge with allergist 02/2016   Red Yeast Rice [Cholestin] Other (See Comments)    Muscle pain and severe joint pain.    Rosuvastatin Other (See Comments)    Extreme joint pain, to this & other statins  Other Reaction(s): Other (See Comments)  Other  reaction(s): Other (See Comments), Extreme joint pain, Extreme joint pain, Extreme joint pain, Extreme joint pain, to this & other statins   Rotigotine Swelling    (Neupro) Extreme edema    Atrovent Hfa [Ipratropium Bromide Hfa] Other (See Comments)    wheezing   Iodine Swelling   Ipratropium Bromide     Other Reaction(s): Bronchospasm, Other (See Comments)  Other reaction(s): Other (See Comments), Wheezing becomes worse, wheezing, wheezing, Wheezing becomes worse   Morphine    Other Other (See Comments)    Surgical Staples causes redness/infection per patient.   Pepto-Bismol [Bismuth Subsalicylate] Nausea And Vomiting    Extreme vomiting   Wound Dressing Adhesive Other (See Comments)  Electrodes causes skin breakdown, Electrodes causes skin breakdown   Enalapril Cough   Suprep [Na Sulfate-K Sulfate-Mg Sulf] Nausea And Vomiting   Tape Other (See Comments)    Electrodes causes skin breakdown    IV Location/Drains/Wounds Patient Lines/Drains/Airways Status     Active Line/Drains/Airways     Name Placement date Placement time Site Days   Peripheral IV 09/10/23 20 G 1" Anterior;Right Forearm 09/10/23  1435  Forearm  less than 1   Negative Pressure Wound Therapy Thigh Anterior;Distal;Right 07/22/21  --  --  780   Pressure Injury 12/21/21 Sacrum Mid Stage 2 -  Partial thickness loss of dermis presenting as a shallow open injury with a red, pink wound bed without slough. 12/21/21  1900  -- 628   Pressure Injury 12/21/21 Sacrum Lower;Mid Stage 2 -  Partial thickness loss of dermis presenting as a shallow open injury with a red, pink wound bed without slough. 12/21/21  1500  -- 628   Wound / Incision (Open or Dehisced) 06/08/21 Other (Comment) Breast Left 06/08/21  2250  Breast  824   Wound / Incision (Open or Dehisced) 06/08/21 Non-pressure wound;Other (Comment) Lumbar Right Pink and white 06/08/21  2250  Lumbar  824   Wound / Incision (Open or Dehisced) 06/08/21 Other (Comment) Labia  Left;Upper 06/08/21  2250  Labia  824   Wound / Incision (Open or Dehisced) 12/21/21 Non-pressure wound Umbilicus Mid 12/21/21  1500  Umbilicus  628   Wound / Incision (Open or Dehisced) 12/21/21 Non-pressure wound Ankle Anterior;Left 12/21/21  1500  Ankle  628   Wound / Incision (Open or Dehisced) 06/14/22 Diabetic ulcer Foot Left;Other (Comment) 06/14/22  1727  Foot  453            Labs/Imaging Results for orders placed or performed during the hospital encounter of 09/10/23 (from the past 48 hours)  CBC with Differential     Status: Abnormal   Collection Time: 09/10/23  2:34 PM  Result Value Ref Range   WBC 10.1 4.0 - 10.5 K/uL   RBC 4.75 3.87 - 5.11 MIL/uL   Hemoglobin 8.2 (L) 12.0 - 15.0 g/dL    Comment: Reticulocyte Hemoglobin testing may be clinically indicated, consider ordering this additional test [id number]    HCT 32.1 (L) 36.0 - 46.0 %   MCV 67.6 (L) 80.0 - 100.0 fL   MCH 17.3 (L) 26.0 - 34.0 pg   MCHC 25.5 (L) 30.0 - 36.0 g/dL   RDW 10.9 (H) 32.3 - 55.7 %   Platelets 324 150 - 400 K/uL   nRBC 0.2 0.0 - 0.2 %   Neutrophils Relative % 69 %   Neutro Abs 7.0 1.7 - 7.7 K/uL   Lymphocytes Relative 17 %   Lymphs Abs 1.7 0.7 - 4.0 K/uL   Monocytes Relative 10 %   Monocytes Absolute 1.0 0.1 - 1.0 K/uL   Eosinophils Relative 3 %   Eosinophils Absolute 0.3 0.0 - 0.5 K/uL   Basophils Relative 1 %   Basophils Absolute 0.1 0.0 - 0.1 K/uL   Immature Granulocytes 0 %   Abs Immature Granulocytes 0.04 0.00 - 0.07 K/uL   Polychromasia PRESENT    Ovalocytes PRESENT     Comment: Performed at [facility], [address] [place], [place] [address]  Comprehensive metabolic panel     Status: Abnormal   Collection Time: 09/10/23  2:34 PM  Result Value Ref Range   Sodium 140 135 - 145 mmol/L  Potassium 4.6 3.5 - 5.1 mmol/L   Chloride 104 98 - 111 mmol/L   CO2 22 22 - 32 mmol/L   Glucose, Bld 180 (H) 70 - 99 mg/dL    Comment: Glucose reference range applies  only to samples taken after fasting for at least 8 hours.   BUN 13 8 - 23 mg/dL   Creatinine, Ser 1.61 0.44 - 1.00 mg/dL   Calcium 9.1 8.9 - 09.6 mg/dL   Total Protein 7.2 6.5 - 8.1 g/dL   Albumin 3.2 (L) 3.5 - 5.0 g/dL   AST 32 15 - 41 U/L   ALT 23 0 - 44 U/L   Alkaline Phosphatase 88 38 - 126 U/L   Total Bilirubin 0.6 0.0 - 1.2 mg/dL   GFR, Estimated >04 >54 mL/min    Comment: (NOTE) Calculated using the CKD-EPI Creatinine Equation (2021)    Anion gap 14 5 - 15    Comment: Performed at [facility], [address], [place], [place] [address]  Troponin I (High Sensitivity)     Status: None   Collection Time: 09/10/23  2:34 PM  Result Value Ref Range   Troponin I (High Sensitivity) 17 <18 ng/L    Comment: (NOTE) Elevated high sensitivity troponin I (hsTnI) values and significant  changes across serial measurements may suggest ACS but many other  chronic and acute conditions are known to elevate hsTnI results.  Refer to the "Links" section for chest pain algorithms and additional  guidance. Performed at [facility], [address] [place], [place] [address]   CK     Status: None   Collection Time: 09/10/23  2:34 PM  Result Value Ref Range   Total CK 121 38 - 234 U/L    Comment: Performed at [facility], [address] [place], [place] [address]  Resp panel by RT-PCR (RSV, Flu A&B, Covid) Anterior Nasal Swab     Status: None   Collection Time: 09/10/23  2:59 PM   Specimen: Anterior Nasal Swab  Result Value Ref Range   SARS Coronavirus 2 by RT PCR NEGATIVE NEGATIVE    Comment: (NOTE) SARS-CoV-2 target nucleic acids are NOT DETECTED.  The SARS-CoV-2 RNA is generally detectable in upper respiratory specimens during the acute phase of infection. The lowest concentration of SARS-CoV-2 viral copies this assay can detect is 138 copies/mL. A negative result does not preclude SARS-Cov-2 infection and should not be used as  the sole basis for treatment or other patient management decisions. A negative result may occur with  improper specimen collection/handling, submission of specimen other than nasopharyngeal swab, presence of viral mutation(s) within the areas targeted by this assay, and inadequate number of viral copies(<138 copies/mL). A negative result must be combined with clinical observations, patient history, and epidemiological information. The expected result is Negative.  Fact Sheet for Patients:  BloggerCourse.com  Fact Sheet for Healthcare Providers:  SeriousBroker.it  This test is no t yet approved or cleared by the [place] FDA and  has been authorized for detection and/or diagnosis of SARS-CoV-2 by FDA under an Emergency Use Authorization (EUA). This EUA will remain  in effect (meaning this test can be used) for the duration of the COVID-19 declaration under Section 564(b)(1) of the Act, 21 U.S.C.section 360bbb-3(b)(1), unless the authorization is terminated  or revoked sooner.       Influenza A by PCR NEGATIVE NEGATIVE   Influenza B by PCR NEGATIVE NEGATIVE    Comment: (NOTE) The  Xpert Xpress SARS-CoV-2/FLU/RSV plus assay is intended as an aid in the diagnosis of influenza from Nasopharyngeal swab specimens and should not be used as a sole basis for treatment. Nasal washings and aspirates are unacceptable for Xpert Xpress SARS-CoV-2/FLU/RSV testing.  Fact Sheet for Patients: BloggerCourse.com  Fact Sheet for Healthcare Providers: SeriousBroker.it  This test is not yet approved or cleared by the [place] FDA and has been authorized for detection and/or diagnosis of SARS-CoV-2 by FDA under an Emergency Use Authorization (EUA). This EUA will remain in effect (meaning this test can be used) for the duration of the COVID-19 declaration under Section 564(b)(1) of the Act, 21  U.S.C. section 360bbb-3(b)(1), unless the authorization is terminated or revoked.     Resp Syncytial Virus by PCR NEGATIVE NEGATIVE    Comment: (NOTE) Fact Sheet for Patients: BloggerCourse.com  Fact Sheet for Healthcare Providers: SeriousBroker.it  This test is not yet approved or cleared by the [place] FDA and has been authorized for detection and/or diagnosis of SARS-CoV-2 by FDA under an Emergency Use Authorization (EUA). This EUA will remain in effect (meaning this test can be used) for the duration of the COVID-19 declaration under Section 564(b)(1) of the Act, 21 U.S.C. section 360bbb-3(b)(1), unless the authorization is terminated or revoked.  Performed at [facility], [address] [place], [place] [address]    *Note: Due to a large number of results and/or encounters for the requested time period, some results have not been displayed. A complete set of results can be found in Results Review.   CT CHEST WO CONTRAST Result Date: 09/10/2023 CLINICAL DATA:  History of recent falls with chest and abdominal pain, initial encounter EXAM: CT CHEST, ABDOMEN AND PELVIS WITHOUT CONTRAST TECHNIQUE: Multidetector CT imaging of the chest, abdomen and pelvis was performed following the standard protocol without IV contrast. RADIATION DOSE REDUCTION: This exam was performed according to the departmental dose-optimization program which includes automated exposure control, adjustment of the mA and/or kV according to patient size and/or use of iterative reconstruction technique. COMPARISON:  Chest x-ray from earlier in the same day. FINDINGS: CT CHEST FINDINGS Cardiovascular: Somewhat limited due to lack of IV contrast. Atherosclerotic calcifications of the thoracic aorta are noted. Mild cardiac enlargement is seen. Heavy coronary calcifications are noted. Mediastinum/Nodes: Thoracic inlet is within normal limits. No  hilar or mediastinal adenopathy is noted. The esophagus as visualized is within normal limits. Lungs/Pleura: Lungs are well aerated bilaterally. Azygous lobe is seen. Small right-sided pleural effusion is noted with patchy atelectatic changes in the right base. No focal confluent infiltrate is seen. Musculoskeletal: Degenerative changes of the thoracic spine are noted. No acute rib abnormality is seen. Soft tissues edema is noted throughout the right breast without definitive abscess. CT ABDOMEN PELVIS FINDINGS Hepatobiliary: No focal liver abnormality is seen. Status post cholecystectomy. No biliary dilatation. Pancreas: Unremarkable. No pancreatic ductal dilatation or surrounding inflammatory changes. Spleen: Normal in size without focal abnormality. Adrenals/Urinary Tract: Adrenal glands are within normal limits. Kidneys are well visualized bilaterally. No renal calculi or obstructive changes are noted. The bladder is well distended. Stomach/Bowel: No obstructive or inflammatory changes of the colon are seen. The appendix is within normal limits. Small bowel and stomach are unremarkable. Vascular/Lymphatic: Aortic atherosclerosis. No enlarged abdominal or pelvic lymph nodes. Reproductive: Status post hysterectomy. No adnexal masses. Other: No abdominal wall hernia or abnormality. No abdominopelvic ascites. Musculoskeletal: No acute bony abnormality is noted in the abdomen and pelvis. Degenerative changes of lumbar spine  are seen. Similar subcutaneous edema and skin thickening is noted in the abdominal wall and pelvis which may represent some generalized cellulitis. Correlation with the physical exam is recommended. IMPRESSION: CT of the chest: Patchy atelectatic changes in the right base. Small effusion is noted on the right as well. Considerable subcutaneous edema within the right breast consistent with cellulitis. CT of the abdomen and pelvis: Considerable subcutaneous edema in the abdominal wall and gluteal  regions similar to that seen in the right breast again suspicious for cellulitis. Diffuse skin thickening is noted. Electronically Signed   By: Alcide Clever M.D.   On: 09/10/2023 18:59   CT ABDOMEN PELVIS WO CONTRAST Result Date: 09/10/2023 CLINICAL DATA:  History of recent falls with chest and abdominal pain, initial encounter EXAM: CT CHEST, ABDOMEN AND PELVIS WITHOUT CONTRAST TECHNIQUE: Multidetector CT imaging of the chest, abdomen and pelvis was performed following the standard protocol without IV contrast. RADIATION DOSE REDUCTION: This exam was performed according to the departmental dose-optimization program which includes automated exposure control, adjustment of the mA and/or kV according to patient size and/or use of iterative reconstruction technique. COMPARISON:  Chest x-ray from earlier in the same day. FINDINGS: CT CHEST FINDINGS Cardiovascular: Somewhat limited due to lack of IV contrast. Atherosclerotic calcifications of the thoracic aorta are noted. Mild cardiac enlargement is seen. Heavy coronary calcifications are noted. Mediastinum/Nodes: Thoracic inlet is within normal limits. No hilar or mediastinal adenopathy is noted. The esophagus as visualized is within normal limits. Lungs/Pleura: Lungs are well aerated bilaterally. Azygous lobe is seen. Small right-sided pleural effusion is noted with patchy atelectatic changes in the right base. No focal confluent infiltrate is seen. Musculoskeletal: Degenerative changes of the thoracic spine are noted. No acute rib abnormality is seen. Soft tissues edema is noted throughout the right breast without definitive abscess. CT ABDOMEN PELVIS FINDINGS Hepatobiliary: No focal liver abnormality is seen. Status post cholecystectomy. No biliary dilatation. Pancreas: Unremarkable. No pancreatic ductal dilatation or surrounding inflammatory changes. Spleen: Normal in size without focal abnormality. Adrenals/Urinary Tract: Adrenal glands are within normal  limits. Kidneys are well visualized bilaterally. No renal calculi or obstructive changes are noted. The bladder is well distended. Stomach/Bowel: No obstructive or inflammatory changes of the colon are seen. The appendix is within normal limits. Small bowel and stomach are unremarkable. Vascular/Lymphatic: Aortic atherosclerosis. No enlarged abdominal or pelvic lymph nodes. Reproductive: Status post hysterectomy. No adnexal masses. Other: No abdominal wall hernia or abnormality. No abdominopelvic ascites. Musculoskeletal: No acute bony abnormality is noted in the abdomen and pelvis. Degenerative changes of lumbar spine are seen. Similar subcutaneous edema and skin thickening is noted in the abdominal wall and pelvis which may represent some generalized cellulitis. Correlation with the physical exam is recommended. IMPRESSION: CT of the chest: Patchy atelectatic changes in the right base. Small effusion is noted on the right as well. Considerable subcutaneous edema within the right breast consistent with cellulitis. CT of the abdomen and pelvis: Considerable subcutaneous edema in the abdominal wall and gluteal regions similar to that seen in the right breast again suspicious for cellulitis. Diffuse skin thickening is noted. Electronically Signed   By: Alcide Clever M.D.   On: 09/10/2023 18:59   CT L-SPINE NO CHARGE Result Date: 09/10/2023 CLINICAL DATA:  Fall.  Leg pain. EXAM: CT LUMBAR SPINE WITHOUT CONTRAST TECHNIQUE: Multidetector CT imaging of the lumbar spine was performed without intravenous contrast administration. Multiplanar CT image reconstructions were also generated. RADIATION DOSE REDUCTION: This exam was performed according to  the departmental dose-optimization program which includes automated exposure control, adjustment of the mA and/or kV according to patient size and/or use of iterative reconstruction technique. COMPARISON:  MRI lumbar spine 05/25/2016. FINDINGS: Segmentation: Conventional  numbering is assumed with 5 non-rib-bearing, lumbar type vertebral bodies. Alignment: Unchanged grade 1 anterolisthesis of L4 on L5. Levoscoliotic curvature of the lumbar spine. Vertebrae: Normal vertebral body heights. No acute fracture or suspicious bone lesion. Multilevel degenerative endplate changes. Paraspinal and other soft tissues: Atherosclerotic calcifications of the abdominal aorta and its branches. Disc levels: T12-L1: Right-greater-than-left facet arthropathy. No spinal canal stenosis or neural foraminal narrowing. L1-L2: Disc bulge and facet arthropathy results in mild spinal canal stenosis and moderate right neural foraminal narrowing. L2-L3: Left eccentric disc bulge and right eccentric endplate osteophytes results in mild spinal canal stenosis and moderate right neural foraminal narrowing. L3-L4: Disc bulge and right-greater-than-left facet arthropathy results in mild spinal canal stenosis, severe right and moderate left neural foraminal narrowing. L4-L5: Acquired fusion. No spinal canal stenosis or neural foraminal narrowing. L5-S1: No disc herniation. Severe left-greater-than-right facet arthropathy results in severe left and moderate right neural foraminal narrowing. IMPRESSION: 1. No acute fracture or traumatic listhesis of the lumbar spine. 2. Multilevel degenerative changes of the lumbar spine, as described above. Aortic Atherosclerosis (ICD10-I70.0). Electronically Signed   By: Orvan Falconer M.D.   On: 09/10/2023 17:08   DG Shoulder Right Portable Result Date: 09/10/2023 CLINICAL DATA:  Fall EXAM: RIGHT SHOULDER - 1 VIEW COMPARISON:  None available FINDINGS: No fracture or dislocation. Irregularity of the greater tuberosity indicative of rotator cuff tendinopathy. Mild right acromioclavicular osteoarthrosis. Advanced degenerative changes of the visualized cervical spine. Examination limited due to under penetration. IMPRESSION: No acute abnormality of the right shoulder. Electronically  Signed   By: Acquanetta Belling M.D.   On: 09/10/2023 15:46   DG Chest Portable 1 View Result Date: 09/10/2023 CLINICAL DATA:  Fall EXAM: PORTABLE CHEST - 1 VIEW COMPARISON:  09/02/2023 FINDINGS: Unchanged cardiomegaly.  No pulmonary vascular congestion. Minimal right basilar atelectasis, similar to prior examination. Lungs are otherwise clear. Overall examination limited due to underpenetration. IMPRESSION: Limited examination.  Mild right basilar atelectasis. Electronically Signed   By: Acquanetta Belling M.D.   On: 09/10/2023 15:45    Pending Labs Unresulted Labs (From admission, onward)     Start     Ordered   09/17/23 0500  Creatinine, serum  (enoxaparin (LOVENOX)    CrCl >/= 30 ml/min)  Weekly,   R     Comments: while on enoxaparin therapy    09/10/23 2001   09/11/23 0500  Protime-INR  Tomorrow morning,   R        09/10/23 2001   09/11/23 0500  Cortisol-am, blood  Tomorrow morning,   R        09/10/23 2001   09/11/23 0500  Comprehensive metabolic panel  Tomorrow morning,   R        09/10/23 2001   09/11/23 0500  CBC  Tomorrow morning,   R        09/10/23 2001   09/10/23 2000  HIV Antibody (routine testing w rflx)  (HIV Antibody (Routine testing w reflex) panel)  Once,   R        09/10/23 2001   09/10/23 2000  CBC  (enoxaparin (LOVENOX)    CrCl >/= 30 ml/min)  Once,   R       Comments: Baseline for enoxaparin therapy IF NOT ALREADY DRAWN.  Notify MD if  PLT < 100 K.    09/10/23 2001   09/10/23 2000  Creatinine, serum  (enoxaparin (LOVENOX)    CrCl >/= 30 ml/min)  Once,   R       Comments: Baseline for enoxaparin therapy IF NOT ALREADY DRAWN.    09/10/23 2001   09/10/23 2000  Magnesium  Once,   R        09/10/23 2001   09/10/23 2000  TSH  Once,   R        09/10/23 2001   09/10/23 1402  Blood culture (routine x 2)  BLOOD CULTURE X 2,   R (with STAT occurrences)      09/10/23 1401            Vitals/Pain Today's Vitals   09/10/23 1321 09/10/23 1323 09/10/23 1623 09/10/23 1926   BP:   (!) 130/111 (!) 143/83  Pulse:   74 (!) 106  Resp:   (!) 24 16  Temp:   98.4 F (36.9 C) 97.9 F (36.6 C)  TempSrc:   Oral Oral  SpO2:   98% 97%  Weight: 132.5 kg     Height: 5' 2.5" (1.588 m)     PainSc:  8       Isolation Precautions No active isolations  Medications Medications  albuterol (PROVENTIL) (2.5 MG/3ML) 0.083% nebulizer solution 2.5 mg (has no administration in time range)  aspirin EC tablet 81 mg (has no administration in time range)  fluticasone (FLONASE) 50 MCG/ACT nasal spray 1 spray (has no administration in time range)  furosemide (LASIX) tablet 40 mg (has no administration in time range)  gabapentin (NEURONTIN) capsule 400 mg (has no administration in time range)  guaiFENesin (MUCINEX) 12 hr tablet 600 mg (has no administration in time range)  insulin regular human CONCENTRATED (HUMULIN R) 500 UNIT/ML injection 50 Units (has no administration in time range)  isosorbide mononitrate (IMDUR) 24 hr tablet 60 mg (has no administration in time range)  levothyroxine (SYNTHROID) tablet 200 mcg (has no administration in time range)  pantoprazole (PROTONIX) EC tablet 40 mg (has no administration in time range)  pramipexole (MIRAPEX) tablet 1 mg (has no administration in time range)  pregabalin (LYRICA) capsule 50 mg (has no administration in time range)  vancomycin (VANCOCIN) IVPB 1000 mg/200 mL premix (has no administration in time range)  lactated ringers infusion (has no administration in time range)  enoxaparin (LOVENOX) injection 40 mg (has no administration in time range)  sodium chloride flush (NS) 0.9 % injection 3 mL (has no administration in time range)  acetaminophen (TYLENOL) tablet 650 mg (has no administration in time range)    Or  acetaminophen (TYLENOL) suppository 650 mg (has no administration in time range)  ondansetron (ZOFRAN) tablet 4 mg (has no administration in time range)    Or  ondansetron (ZOFRAN) injection 4 mg (has no administration  in time range)  lactated ringers bolus 1,000 mL (0 mLs Intravenous Stopped 09/10/23 1923)  cefTRIAXone (ROCEPHIN) 2 g in sodium chloride 0.9 % 100 mL IVPB (0 g Intravenous Stopped 09/10/23 1518)  vancomycin (VANCOCIN) IVPB 1000 mg/200 mL premix (0 mg Intravenous Stopped 09/10/23 1923)    Mobility non-ambulatory

## 2023-09-10 NOTE — ED Provider Notes (Addendum)
[place] EMERGENCY DEPARTMENT AT [facility] Provider Note   CSN: [id number] Arrival date & time: 09/10/23  1257     History  Chief Complaint  Patient presents with   [facility] [place] is a 62 y.o. female.   Fall     62 year old female with medical history significant for morbid obesity, chronic diastolic CHF, nonalcoholic fatty liver disease, abdominal wall anasarca, type 2 diabetes mellitus, CAD, hypertension, hyperlipidemia, aortic stenosis, OSA not on CPAP who presents to the emergency department with concern for left lower extremity and right breast cellulitis.  The patient was seen in the emergency department overnight from 12/22 to 12/23.  She had right arm swelling as well as cellulitis of the chest wall.  She has been taking antibiotics (cefadroxil) but has been getting worse.  Additionally, the patient is status post amputation of the right leg.  She fell last night in the process of sliding off the bed while transferring to a bedside commode.  She laid on the floor all night last night, denies head trauma or loss of consciousness, complains of shoulder pain on the right as well as pain in the left leg.  She did not strike her leg, the pain is from worsening swelling and redness.  Home Medications Prior to Admission medications   Medication Sig Start Date End Date Taking? Authorizing Provider  acetaminophen (TYLENOL) 500 MG tablet Take 500 mg by mouth every 8 (eight) hours as needed for mild pain.    [provider]  albuterol (PROVENTIL) (2.5 MG/3ML) 0.083% nebulizer solution USE 1 VIAL IN NEBULIZER EVERY 4 HOURS AS NEEDED FOR WHEEZING FOR SHORTNESS OF BREATH 09/29/22   Sharlene Dory, DO  albuterol (VENTOLIN HFA) 108 (90 Base) MCG/ACT inhaler INHALE 2 PUFFS BY MOUTH EVERY 6 HOURS AS NEEDED FOR WHEEZING FOR SHORTNESS OF BREATH 04/26/23   Sharlene Dory, DO  aspirin EC 81 MG tablet Take by mouth. 12/22/15   [provider]   augmented betamethasone dipropionate (DIPROLENE AF) 0.05 % cream Apply 1 Application topically 2 (two) times daily as needed (rash). 01/30/23   Sharlene Dory, DO  Blood Glucose Monitoring Suppl (ONETOUCH VERIO REFLECT) w/Device KIT Use to check blood sugar 4 times daily.  DX E11.65 and Z79.4 04/12/23   Sharlene Dory, DO  cefadroxil (DURICEF) 500 MG capsule Take 1 capsule (500 mg total) by mouth 2 (two) times daily. 09/03/23   Dione Booze, MD  Continuous Glucose Sensor (DEXCOM G7 SENSOR) MISC USE TO CHECK FOR BLOOD SUGAR CONTINUOUSLY- CHANGE SENSOR EVERY 10 DAYS 05/08/23   Sharlene Dory, DO  diclofenac Sodium (PENNSAID) 2 % SOLN APPLY 2 PUMPS (40MG  TOTAL) TOPICALLY TWICE DAILY AS NEEDED 08/17/23   Wendling, Jilda Roche, DO  diclofenac Sodium (VOLTAREN) 1 % GEL Apply 2 g topically 4 (four) times daily. 06/28/23   Raulkar, Drema Pry, MD  Diclofenac Sodium 3 % GEL Apply 1 Application topically 4 (four) times daily as needed. 05/11/23   Raulkar, Drema Pry, MD  DILT-XR 240 MG 24 hr capsule Take 1 capsule (240 mg total) by mouth daily. 06/27/23   Quintella Reichert, MD  diphenhydrAMINE 25 mg in sodium chloride 0.9 % 50 mL Take by mouth. 04/05/13   [provider]  EPINEPHrine 0.3 mg/0.3 mL IJ SOAJ injection Inject 0.3 mg into the muscle as needed for anaphylaxis. Patient not taking: Reported on 09/10/2023 09/07/22   Sharlene Dory, DO  fluconazole (DIFLUCAN) 150 MG tablet  Take 1 tab, repeat in 72 hours if no improvement. 09/10/23   Sharlene Dory, DO  fluticasone (FLONASE) 50 MCG/ACT nasal spray USE 2 SPRAY(S) IN EACH NOSTRIL ONCE DAILY AS NEEDED FOR ALLERGIES OR  RHINITIS 04/09/23   Sharlene Dory, DO  furosemide (LASIX) 20 MG tablet Take 2 tablets (40 mg total) by mouth daily. 09/01/22   Sharlene Dory, DO  gabapentin (NEURONTIN) 400 MG capsule Take 1 capsule (400 mg total) by mouth 4 (four) times daily. TAKE 1 CAPSULE BY MOUTH IN THE  MORNING AND 1 AT NOON AND 2 AT BEDTIME Strength: 400 mg 05/11/23   Raulkar, Drema Pry, MD  gentamicin ointment (GARAMYCIN) 0.1 % USE AS DIRECTED FOR WOUND CARE 07/23/23   [provider]  Glucagon, rDNA, (GLUCAGON EMERGENCY) 1 MG KIT Inject 1 mg into the vein daily as needed. Patient not taking: Reported on 09/10/2023 05/08/23   Sharlene Dory, DO  GLUCOMANNAN PO Take 1,995 mg by mouth daily.    [provider]  guaiFENesin (MUCINEX) 600 MG 12 hr tablet Take 600 mg by mouth 2 (two) times daily as needed for cough or to loosen phlegm.    [provider]  HUMULIN R 500 UNIT/ML injection INJECT 0.08-0.3 MLS (40-150 UNITS) INTO THE SKIN THREE TIMES DAILY WITH MEALS 08/31/23   Wendling, Jilda Roche, DO  hydrocortisone cream 1 % Apply 1 application topically 3 (three) times daily as needed for itching (minor skin irritation). 06/24/21   Love, Evlyn Kanner, PA-C  INSULIN SYRINGE 1CC/29G (B-D INSULIN SYRINGE) 29G X 1/2" 1 ML MISC Use three times daily with insulin.  DX E11.9 06/11/20   Sharlene Dory, DO  isosorbide mononitrate (IMDUR) 60 MG 24 hr tablet Take 1 tablet (60 mg total) by mouth daily. 04/30/23   Swinyer, Zachary George, NP  levocetirizine (XYZAL) 5 MG tablet TAKE 1 TABLET BY MOUTH ONCE DAILY IN THE EVENING 07/16/23   Wendling, Jilda Roche, DO  levothyroxine (SYNTHROID) 200 MCG tablet Take 1 tablet (200 mcg total) by mouth daily before breakfast. 06/27/23   Wendling, Jilda Roche, DO  lidocaine (LIDODERM) 5 % Place 1 patch onto the skin every 12 (twelve) hours. Remove & Discard patch within 12 hours or as directed 04/12/23   Jones Bales, NP  lidocaine (XYLOCAINE) 5 % ointment Apply topically as directed. 02/02/23   [provider]  metFORMIN (GLUCOPHAGE-XR) 500 MG 24 hr tablet Take 2 tablets (1,000 mg total) by mouth 2 (two) times daily. 06/27/23   Sharlene Dory, DO  Multiple Vitamins-Minerals (WOMENS 50+ MULTI VITAMIN PO) Take 1 tablet by  mouth daily.    [provider]  mupirocin ointment (BACTROBAN) 2 % Apply topically 2 (two) times daily. 05/08/23   Sharlene Dory, DO  naloxone [facility]) nasal spray 4 mg/0.1 mL Place 1 spray into the nose as needed (opoid overdose).    [provider]  nitroGLYCERIN (NITROSTAT) 0.4 MG SL tablet DISSOLVE ONE TABLET UNDER THE TONGUE EVERY 5 MINUTES AS NEEDED FOR CHEST PAIN.  DO NOT EXCEED A TOTAL OF 3 DOSES IN 15 MINUTES Patient not taking: Reported on 09/10/2023 08/23/21   Quintella Reichert, MD  nystatin (MYCOSTATIN/NYSTOP) powder Apply 1 Application topically 3 (three) times daily. R breast 09/10/23   Sharlene Dory, DO  omeprazole (PRILOSEC) 40 MG capsule Take 1 capsule by mouth once daily 08/24/23   Wendling, Jilda Roche, DO  OneTouch Delica Lancets 33G MISC Use to check blood glucose up to  4 times a day 11/16/22   Sharlene Dory, DO  [facility] VERIO test strip Use as instructed 06/27/23   Sharlene Dory, DO  Oxycodone HCl 20 MG TABS Take 1 tablet (20 mg total) by mouth every 8 (eight) hours as needed. 08/16/23   Jones Bales, NP  oxymetazoline (AFRIN) 0.05 % nasal spray Place 2 sprays into both nostrils 2 (two) times daily as needed for congestion.    [provider]  Polyethyl Glyc-Propyl Glyc PF (SYSTANE PRESERVATIVE FREE) 0.4-0.3 % SOLN Apply to eye. 05/11/16   [provider]  pramipexole (MIRAPEX) 1 MG tablet Take 1 tablet (1 mg total) by mouth 3 (three) times daily. 08/08/23   Saguier, Ramon Dredge, PA-C  pregabalin (LYRICA) 50 MG capsule Take 1 capsule (50 mg total) by mouth 3 (three) times daily. 05/11/23   Raulkar, Drema Pry, MD  promethazine (PHENERGAN) 25 MG tablet Take 1 tablet (25 mg total) by mouth every 8 (eight) hours as needed for vomiting or nausea. 04/20/23   Sharlene Dory, DO  saccharomyces boulardii (FLORASTOR) 250 MG capsule Take 250 mg by mouth 3 (three) times daily.    [provider]  Sodium  Hyaluronate, oral, (HYALURONIC ACID PO)     [provider]  Vitamin D, Ergocalciferol, (DRISDOL) 1.25 MG (50000 UNIT) CAPS capsule TAKE 1 CAPSULE BY MOUTH ONCE A WEEK ON  FRIDAYS 06/27/23   Sharlene Dory, DO  Wound Dressings (ELTA DERMAL WOUND DRESSING EX) Apply topically.    [provider]      Allergies    Fish allergy, Fish oil, Gabapentin, Ipratropium-albuterol, Omega-3 fatty acids, Peanut-containing drug products, Victoza [liraglutide], Lovastatin, Metronidazole, Penicillins, Red yeast rice [cholestin], Rosuvastatin, Rotigotine, Atrovent hfa [ipratropium bromide hfa], Iodine, Ipratropium bromide, Morphine, Other, Pepto-bismol [bismuth subsalicylate], Wound dressing adhesive, Enalapril, Suprep [na sulfate-k sulfate-mg sulf], and Tape    Review of Systems   Review of Systems  All other systems reviewed and are negative.   Physical Exam Updated Vital Signs BP (!) 106/55 (BP Location: Left Arm)   Pulse (!) 101   Temp 98 F (36.7 C) (Oral)   Resp 20   Ht 5' 2.5" (1.588 m)   Wt 132.5 kg   SpO2 93%   BMI 52.56 kg/m  Physical Exam Vitals and nursing note reviewed.  Constitutional:      General: She is not in acute distress.    Appearance: She is well-developed. She is obese.     Comments: Morbidly obese  HENT:     Head: Normocephalic and atraumatic.  Eyes:     Conjunctiva/sclera: Conjunctivae normal.  Cardiovascular:     Rate and Rhythm: Normal rate and regular rhythm.  Pulmonary:     Effort: Pulmonary effort is normal. No respiratory distress.     Breath sounds: Normal breath sounds.  Chest:     Comments: Right chest wall with significant erythema and tenderness about the right breast Abdominal:     General: There is distension.     Palpations: Abdomen is soft.     Tenderness: There is no abdominal tenderness.     Comments: Significant abdominal distention, striae and anasarca, skin thickening throughout the abdomen without erythema   Musculoskeletal:        General: Swelling and tenderness present.     Cervical back: Neck supple.     Comments: Left lower extremity with significant erythema and edema, right shoulder with mild tenderness, range of motion passively intact.  Lymphedema of the bilateral lower  extremities  Skin:    General: Skin is warm and dry.     Capillary Refill: Capillary refill takes less than 2 seconds.  Neurological:     Mental Status: She is alert.  Psychiatric:        Mood and Affect: Mood normal.     ED Results / Procedures / Treatments   Labs (all labs ordered are listed, but only abnormal results are displayed) Labs Reviewed  CBC WITH DIFFERENTIAL/PLATELET - Abnormal; Notable for the following components:      Result Value   Hemoglobin 8.2 (*)    HCT 32.1 (*)    MCV 67.6 (*)    MCH 17.3 (*)    MCHC 25.5 (*)    RDW 23.1 (*)    All other components within normal limits  COMPREHENSIVE METABOLIC PANEL - Abnormal; Notable for the following components:   Glucose, Bld 180 (*)    Albumin 3.2 (*)    All other components within normal limits  TSH - Abnormal; Notable for the following components:   TSH 5.692 (*)    All other components within normal limits  GLUCOSE, CAPILLARY - Abnormal; Notable for the following components:   Glucose-Capillary 155 (*)    All other components within normal limits  RESP PANEL BY RT-PCR (RSV, FLU A&B, COVID)  RVPGX2  CULTURE, BLOOD (ROUTINE X 2)  CULTURE, BLOOD (ROUTINE X 2)  CK  MAGNESIUM  PROTIME-INR  CORTISOL-AM, BLOOD  COMPREHENSIVE METABOLIC PANEL  CBC  HEMOGLOBIN A1C  HIV ANTIBODY (ROUTINE TESTING W REFLEX)  TROPONIN I (HIGH SENSITIVITY)    EKG EKG Interpretation Date/Time:  Monday September 10 2023 13:20:14 EST Ventricular Rate:  102 PR Interval:    QRS Duration:  97 QT Interval:  388 QTC Calculation: 506 R Axis:   30  Text Interpretation: Sinus tachycardia Anterolateral infarct, old Minimal ST elevation, inferior leads Prolonged QT  interval Confirmed by Ernie Avena ([id number]) on 09/10/2023 2:04:44 PM  Radiology CT CHEST WO CONTRAST Result Date: 09/10/2023 CLINICAL DATA:  History of recent falls with chest and abdominal pain, initial encounter EXAM: CT CHEST, ABDOMEN AND PELVIS WITHOUT CONTRAST TECHNIQUE: Multidetector CT imaging of the chest, abdomen and pelvis was performed following the standard protocol without IV contrast. RADIATION DOSE REDUCTION: This exam was performed according to the departmental dose-optimization program which includes automated exposure control, adjustment of the mA and/or kV according to patient size and/or use of iterative reconstruction technique. COMPARISON:  Chest x-ray from earlier in the same day. FINDINGS: CT CHEST FINDINGS Cardiovascular: Somewhat limited due to lack of IV contrast. Atherosclerotic calcifications of the thoracic aorta are noted. Mild cardiac enlargement is seen. Heavy coronary calcifications are noted. Mediastinum/Nodes: Thoracic inlet is within normal limits. No hilar or mediastinal adenopathy is noted. The esophagus as visualized is within normal limits. Lungs/Pleura: Lungs are well aerated bilaterally. Azygous lobe is seen. Small right-sided pleural effusion is noted with patchy atelectatic changes in the right base. No focal confluent infiltrate is seen. Musculoskeletal: Degenerative changes of the thoracic spine are noted. No acute rib abnormality is seen. Soft tissues edema is noted throughout the right breast without definitive abscess. CT ABDOMEN PELVIS FINDINGS Hepatobiliary: No focal liver abnormality is seen. Status post cholecystectomy. No biliary dilatation. Pancreas: Unremarkable. No pancreatic ductal dilatation or surrounding inflammatory changes. Spleen: Normal in size without focal abnormality. Adrenals/Urinary Tract: Adrenal glands are within normal limits. Kidneys are well visualized bilaterally. No renal calculi or obstructive changes are noted. The bladder is well  distended.  Stomach/Bowel: No obstructive or inflammatory changes of the colon are seen. The appendix is within normal limits. Small bowel and stomach are unremarkable. Vascular/Lymphatic: Aortic atherosclerosis. No enlarged abdominal or pelvic lymph nodes. Reproductive: Status post hysterectomy. No adnexal masses. Other: No abdominal wall hernia or abnormality. No abdominopelvic ascites. Musculoskeletal: No acute bony abnormality is noted in the abdomen and pelvis. Degenerative changes of lumbar spine are seen. Similar subcutaneous edema and skin thickening is noted in the abdominal wall and pelvis which may represent some generalized cellulitis. Correlation with the physical exam is recommended. IMPRESSION: CT of the chest: Patchy atelectatic changes in the right base. Small effusion is noted on the right as well. Considerable subcutaneous edema within the right breast consistent with cellulitis. CT of the abdomen and pelvis: Considerable subcutaneous edema in the abdominal wall and gluteal regions similar to that seen in the right breast again suspicious for cellulitis. Diffuse skin thickening is noted. Electronically Signed   By: Alcide Clever M.D.   On: 09/10/2023 18:59   CT ABDOMEN PELVIS WO CONTRAST Result Date: 09/10/2023 CLINICAL DATA:  History of recent falls with chest and abdominal pain, initial encounter EXAM: CT CHEST, ABDOMEN AND PELVIS WITHOUT CONTRAST TECHNIQUE: Multidetector CT imaging of the chest, abdomen and pelvis was performed following the standard protocol without IV contrast. RADIATION DOSE REDUCTION: This exam was performed according to the departmental dose-optimization program which includes automated exposure control, adjustment of the mA and/or kV according to patient size and/or use of iterative reconstruction technique. COMPARISON:  Chest x-ray from earlier in the same day. FINDINGS: CT CHEST FINDINGS Cardiovascular: Somewhat limited due to lack of IV contrast. Atherosclerotic  calcifications of the thoracic aorta are noted. Mild cardiac enlargement is seen. Heavy coronary calcifications are noted. Mediastinum/Nodes: Thoracic inlet is within normal limits. No hilar or mediastinal adenopathy is noted. The esophagus as visualized is within normal limits. Lungs/Pleura: Lungs are well aerated bilaterally. Azygous lobe is seen. Small right-sided pleural effusion is noted with patchy atelectatic changes in the right base. No focal confluent infiltrate is seen. Musculoskeletal: Degenerative changes of the thoracic spine are noted. No acute rib abnormality is seen. Soft tissues edema is noted throughout the right breast without definitive abscess. CT ABDOMEN PELVIS FINDINGS Hepatobiliary: No focal liver abnormality is seen. Status post cholecystectomy. No biliary dilatation. Pancreas: Unremarkable. No pancreatic ductal dilatation or surrounding inflammatory changes. Spleen: Normal in size without focal abnormality. Adrenals/Urinary Tract: Adrenal glands are within normal limits. Kidneys are well visualized bilaterally. No renal calculi or obstructive changes are noted. The bladder is well distended. Stomach/Bowel: No obstructive or inflammatory changes of the colon are seen. The appendix is within normal limits. Small bowel and stomach are unremarkable. Vascular/Lymphatic: Aortic atherosclerosis. No enlarged abdominal or pelvic lymph nodes. Reproductive: Status post hysterectomy. No adnexal masses. Other: No abdominal wall hernia or abnormality. No abdominopelvic ascites. Musculoskeletal: No acute bony abnormality is noted in the abdomen and pelvis. Degenerative changes of lumbar spine are seen. Similar subcutaneous edema and skin thickening is noted in the abdominal wall and pelvis which may represent some generalized cellulitis. Correlation with the physical exam is recommended. IMPRESSION: CT of the chest: Patchy atelectatic changes in the right base. Small effusion is noted on the right as  well. Considerable subcutaneous edema within the right breast consistent with cellulitis. CT of the abdomen and pelvis: Considerable subcutaneous edema in the abdominal wall and gluteal regions similar to that seen in the right breast again suspicious for cellulitis. Diffuse skin thickening is  noted. Electronically Signed   By: Alcide Clever M.D.   On: 09/10/2023 18:59   CT L-SPINE NO CHARGE Result Date: 09/10/2023 CLINICAL DATA:  Fall.  Leg pain. EXAM: CT LUMBAR SPINE WITHOUT CONTRAST TECHNIQUE: Multidetector CT imaging of the lumbar spine was performed without intravenous contrast administration. Multiplanar CT image reconstructions were also generated. RADIATION DOSE REDUCTION: This exam was performed according to the departmental dose-optimization program which includes automated exposure control, adjustment of the mA and/or kV according to patient size and/or use of iterative reconstruction technique. COMPARISON:  MRI lumbar spine 05/25/2016. FINDINGS: Segmentation: Conventional numbering is assumed with 5 non-rib-bearing, lumbar type vertebral bodies. Alignment: Unchanged grade 1 anterolisthesis of L4 on L5. Levoscoliotic curvature of the lumbar spine. Vertebrae: Normal vertebral body heights. No acute fracture or suspicious bone lesion. Multilevel degenerative endplate changes. Paraspinal and other soft tissues: Atherosclerotic calcifications of the abdominal aorta and its branches. Disc levels: T12-L1: Right-greater-than-left facet arthropathy. No spinal canal stenosis or neural foraminal narrowing. L1-L2: Disc bulge and facet arthropathy results in mild spinal canal stenosis and moderate right neural foraminal narrowing. L2-L3: Left eccentric disc bulge and right eccentric endplate osteophytes results in mild spinal canal stenosis and moderate right neural foraminal narrowing. L3-L4: Disc bulge and right-greater-than-left facet arthropathy results in mild spinal canal stenosis, severe right and moderate  left neural foraminal narrowing. L4-L5: Acquired fusion. No spinal canal stenosis or neural foraminal narrowing. L5-S1: No disc herniation. Severe left-greater-than-right facet arthropathy results in severe left and moderate right neural foraminal narrowing. IMPRESSION: 1. No acute fracture or traumatic listhesis of the lumbar spine. 2. Multilevel degenerative changes of the lumbar spine, as described above. Aortic Atherosclerosis (ICD10-I70.0). Electronically Signed   By: Orvan Falconer M.D.   On: 09/10/2023 17:08   DG Shoulder Right Portable Result Date: 09/10/2023 CLINICAL DATA:  Fall EXAM: RIGHT SHOULDER - 1 VIEW COMPARISON:  None available FINDINGS: No fracture or dislocation. Irregularity of the greater tuberosity indicative of rotator cuff tendinopathy. Mild right acromioclavicular osteoarthrosis. Advanced degenerative changes of the visualized cervical spine. Examination limited due to under penetration. IMPRESSION: No acute abnormality of the right shoulder. Electronically Signed   By: Acquanetta Belling M.D.   On: 09/10/2023 15:46   DG Chest Portable 1 View Result Date: 09/10/2023 CLINICAL DATA:  Fall EXAM: PORTABLE CHEST - 1 VIEW COMPARISON:  09/02/2023 FINDINGS: Unchanged cardiomegaly.  No pulmonary vascular congestion. Minimal right basilar atelectasis, similar to prior examination. Lungs are otherwise clear. Overall examination limited due to underpenetration. IMPRESSION: Limited examination.  Mild right basilar atelectasis. Electronically Signed   By: Acquanetta Belling M.D.   On: 09/10/2023 15:45    Procedures Procedures    Medications Ordered in ED Medications  albuterol (PROVENTIL) (2.5 MG/3ML) 0.083% nebulizer solution 2.5 mg (has no administration in time range)  aspirin EC tablet 81 mg (has no administration in time range)  fluticasone (FLONASE) 50 MCG/ACT nasal spray 1 spray (has no administration in time range)  furosemide (LASIX) tablet 40 mg (has no administration in time range)   gabapentin (NEURONTIN) capsule 400 mg (has no administration in time range)  guaiFENesin (MUCINEX) 12 hr tablet 600 mg (has no administration in time range)  insulin regular human CONCENTRATED (HUMULIN R) 500 UNIT/ML injection 50 Units (has no administration in time range)  isosorbide mononitrate (IMDUR) 24 hr tablet 60 mg (has no administration in time range)  levothyroxine (SYNTHROID) tablet 200 mcg (has no administration in time range)  pantoprazole (PROTONIX) EC tablet 40 mg (has no administration  in time range)  pramipexole (MIRAPEX) tablet 1 mg (has no administration in time range)  pregabalin (LYRICA) capsule 50 mg (has no administration in time range)  vancomycin (VANCOCIN) IVPB 1000 mg/200 mL premix (has no administration in time range)  lactated ringers infusion (has no administration in time range)  enoxaparin (LOVENOX) injection 40 mg (has no administration in time range)  sodium chloride flush (NS) 0.9 % injection 3 mL (has no administration in time range)  acetaminophen (TYLENOL) tablet 650 mg (has no administration in time range)    Or  acetaminophen (TYLENOL) suppository 650 mg (has no administration in time range)  ondansetron (ZOFRAN) tablet 4 mg (has no administration in time range)    Or  ondansetron (ZOFRAN) injection 4 mg (has no administration in time range)  fluconazole (DIFLUCAN) tablet 100 mg (has no administration in time range)  insulin aspart (novoLOG) injection 0-20 Units (has no administration in time range)  insulin aspart (novoLOG) injection 0-5 Units ( Subcutaneous Not Given 09/10/23 2104)  vancomycin (VANCOCIN) 1,750 mg in sodium chloride 0.9 % 500 mL IVPB (has no administration in time range)  lactated ringers bolus 1,000 mL (0 mLs Intravenous Stopped 09/10/23 1923)  cefTRIAXone (ROCEPHIN) 2 g in sodium chloride 0.9 % 100 mL IVPB (0 g Intravenous Stopped 09/10/23 1518)  vancomycin (VANCOCIN) IVPB 1000 mg/200 mL premix (0 mg Intravenous Stopped 09/10/23  1923)    ED Course/ Medical Decision Making/ A&P Clinical Course as of 09/10/23 2156  Mon Sep 10, 2023  1638 Hx morbid obesity, L leg and R breast cellulitis, failing OP therapy, fall with low back pain, has contrast allergy, follow up scans, admit for abx regardless [JD]    Clinical Course User Index [JD] Laurence Spates, MD                                 Medical Decision Making Amount and/or Complexity of Data Reviewed Labs: ordered. Radiology: ordered.  Risk Prescription drug management. Decision regarding hospitalization.    62 year old female with medical history significant for morbid obesity, chronic diastolic CHF, nonalcoholic fatty liver disease, abdominal wall anasarca, type 2 diabetes mellitus, CAD, hypertension, hyperlipidemia, aortic stenosis, OSA not on CPAP who presents to the emergency department with concern for left lower extremity and right breast cellulitis.  The patient was seen in the emergency department overnight from 12/22 to 12/23.  She had right arm swelling as well as cellulitis of the chest wall.  She has been taking antibiotics (cefadroxil) but has been getting worse.  Additionally, the patient is status post amputation of the right leg.  She fell last night in the process of sliding off the bed while transferring to a bedside commode.  She laid on the floor all night last night, denies head trauma or loss of consciousness, complains of shoulder pain on the right as well as pain in the left leg.  She did not strike her leg, the pain is from worsening swelling and redness.  On arrival, the patient was afebrile, initially tachycardic heart rate 107, RR 22, BP 121/60, saturating 99% on 4 L O2 via nasal cannula.  Sinus tachycardia noted on cardiac telemetry.  Concern for left lower extremity cellulitis as well as right breast/chest wall cellulitis.  Considered abscess developing.  Patient also presented after a fall sliding out of bed.  No head trauma.  Complaining  of low back pain.  Mild abdominal discomfort.  Patient  also complaining of right shoulder pain, had this complaint previously.  CT imaging of the chest, abdomen pelvis, L-spine was obtained.  Labs pending.  CT imaging was pending at time of signout, concern for failed outpatient treatment of cellulitis, IV access was obtained and the patient was administered broad-spectrum antibiotics.  Signout given to Dr. Earlene Plater at 1700 pending results of CT imaging, plan for likely admission.  Final Clinical Impression(s) / ED Diagnoses Final diagnoses:  Cellulitis of left lower extremity  Cellulitis of right breast    Rx / DC Orders ED Discharge Orders     None         Ernie Avena, MD 09/10/23 2156    Ernie Avena, MD 09/10/23 2156    Ernie Avena, MD 09/10/23 2156

## 2023-09-10 NOTE — ED Provider Notes (Signed)
This is a 62 year old female history of heart failure, aortic stenosis, chronic pain, GERD, type 2 diabetes, hypertension who presented after a fall.  I received patient as a handoff from the previous provider.  Patient presented with an episode of fall and difficulty getting up afterwards.  She has been on cefadroxil for cellulitis of the right breast and left leg since December 22.  She has not any fevers, however her cellulitis of her leg and right breast have not improved.  Handoff will plan to follow-up CT of the abdomen and chest and likely admit for failure of outpatient antibiotics.  Initial circumferential erythema of the left leg with warmth.  No abscess, she also has lymphedema.  She also has an area of induration over the right breast, no clear fluctuance.  I do not see an obvious abscess on CT although entire breast was not included in the image.  She is mildly tachycardic here but afebrile.  No leukocytosis.  Blood cultures antibiotics ordered.  CT L-spine and plain film of the chest and right shoulder unremarkable.  Plan for admission after CT.  CT scan with cellulitis but no drainable fluid collection.  Discussed with hospitalist and admitted.   Laurence Spates, MD 09/11/23 [phone]

## 2023-09-10 NOTE — Telephone Encounter (Signed)
  Chief Complaint: fall Symptoms: fall, intermittent weakness Frequency: last night Pertinent Negatives: Patient denies injury Disposition: [] ED /[] Urgent Care (no appt availability in office) / [x] Appointment(In office/virtual)/ []  [facility]/ [] Home Care/ [] Refused Recommended Disposition /[] [facility]/ []  Follow-up with PCP Additional Notes: Patient's husband called in stating that patient had a fall last night and he is unable to get her up. Husband reports patient attempted to ambulate to the restroom and slid down the wall, landing on her buttocks. Husband denies any injury/ concerns. Husband states that he would like to reschedule in office visit today to a virtual visit due to inability to easily transport patient. Husband would specifically like to discuss care options moving forward as he feels he is having a hard time managing patient's care needs at this time. Virtual video visit scheduled today 12/30. Husband calling EMS to help patient back up in the meantime. Husband advised to call back if worsening condition/symptoms before video visit. Husband verbalized understanding.      Copied from CRM [id number]. Topic: Clinical - Red Word Triage >> Sep 10, 2023  8:36 AM Dennison Nancy wrote: Red Word that prompted transfer to Nurse Triage: had issue overnight patient slip and fell need someone to help  patient up , patient still on the floor , patient has an appointment at 10:15 am and want to know if someone able to take her to her appt. Or if call 911 they will not able to take her to her appt. Reason for Disposition  MILD weakness (i.e., does not interfere with ability to work, go to school, normal activities)  (Exception: Mild weakness is a chronic symptom.)  Answer Assessment - Initial Assessment Questions 1. MECHANISM: "How did the fall happen?"     Patient slid down onto floor, no injury 2. DOMESTIC VIOLENCE AND ELDER ABUSE SCREENING: "Did you fall because someone  pushed you or tried to hurt you?" If Yes, ask: "Are you safe now?"     none 3. ONSET: "When did the fall happen?" (e.g., minutes, hours, or days ago)     Last night 4. LOCATION: "What part of the body hit the ground?" (e.g., back, buttocks, head, hips, knees, hands, head, stomach)     buttocks 5. INJURY: "Did you hurt (injure) yourself when you fell?" If Yes, ask: "What did you injure? Tell me more about this?" (e.g., body area; type of injury; pain severity)"     No injury 6. PAIN: "Is there any pain?" If Yes, ask: "How bad is the pain?" (e.g., Scale 1-10; or mild,  moderate, severe)   - NONE (0): No pain   - MILD (1-3): Doesn't interfere with normal activities    - MODERATE (4-7): Interferes with normal activities or awakens from sleep    - SEVERE (8-10): Excruciating pain, unable to do any normal activities      none 7. SIZE: For cuts, bruises, or swelling, ask: "How large is it?" (e.g., inches or centimeters)      none 8. PREGNANCY: "Is there any chance you are pregnant?" "When was your last menstrual period?"     N/a 9. OTHER SYMPTOMS: "Do you have any other symptoms?" (e.g., dizziness, fever, weakness; new onset or worsening).      weakness 10. CAUSE: "What do you think caused the fall (or falling)?" (e.g., tripped, dizzy spell)       weakness  Protocols used: Falls and [facility]

## 2023-09-10 NOTE — Progress Notes (Signed)
ED Pharmacy Antibiotic Sign Off An antibiotic consult was received from an ED provider for vancomycin per pharmacy dosing for cellulitis. A chart review was completed to assess appropriateness.   The following one time order(s) were placed:  Vancomycin 2 grams  Further antibiotic and/or antibiotic pharmacy consults should be ordered by the admitting provider if indicated.   Thank you for allowing pharmacy to be a part of this patient's care.   Herby Abraham, Pharm.D Use secure chat for questions 09/10/2023 2:10 PM Clinical Pharmacist 09/10/23 2:10 PM

## 2023-09-10 NOTE — H&P (Addendum)
History and Physical    [name] NUU:[id number] DOB: 1961-02-05 DOA: 09/10/2023  PCP: Sharlene Dory, DO  Patient coming from: Home  I have personally briefly reviewed patient's old medical records in [facility]  Chief Complaint: Worsening cellulitis right breast and left lower extremity  HPI: [name] is a 62 y.o. female with medical history significant of morbid obesity, right AKA, irritable bowel syndrome, hypertension, hyperlipidemia, hypothyroidism, type 2 diabetes mellitus, rheumatoid arthritis, asthma who presented to ED for his PCPs recommendation with worsening cellulitis of the left lower extremity and right breast.  Reportedly, patient initially presented to ED on 09/03/2023 and was diagnosed with left lower extremity as well as cellulitis under right breast and was discharged home on cefadroxil.  Patient continued to take medications as prescribed however her cellulitis continued to get worse.  She was seen by her PCP via virtual visit and was advised to come to the emergency department.  Of note, she also slid off of her bed while transferring to the bedside commode last night.  Per patient and her husband was at the bedside, she slid and did not fall and did not hit any part of body and does not have any pain at the moment.  No other complaints.  No recent travel or sick contact.  Of note, patient was also complaining of some abdominal pain however she did not have any abdominal pain complaint with me.  ED Course: Upon arrival to ED, she was tachycardic and tachypneic, blood pressure was slightly elevated.  Patient was slightly hypoxic saturating 89% on room air and currently on 2 L of oxygen saturating 96%.  Normal white cells and she was afebrile.  Due to abdominal pain, she ended up having CT abdomen and pelvis which showed subcutaneous edema and abdominal wall and gluteal regions similar to what was seen in the right breast at previously suspicious for  cellulitis.  CT chest was done which showed patchy atelectasis at the right base and small effusion in the right as well.  She received Rocephin and vancomycin and hospitalist were consulted for admission.  Review of Systems: As per HPI otherwise negative.    Past Medical History:  Diagnosis Date   Aortic stenosis    mild AS by echo 02/2021   Asthma    "on daily RX and rescue inhaler" (04/18/2018)   Chronic diastolic CHF (congestive heart failure) (HCC) 09/2015   Chronic lower back pain    Chronic neck pain    Chronic pain syndrome    Fentanyl Patch   Colon polyps    Coronary artery disease    cath with normal LM, 30% LAD, 85% mid RCA and 95% distal RCA s/p PCI of the mid to distal RCA and now on DAPT with ASA and Ticagrelor.     Eczema    Excessive daytime sleepiness 11/26/2015   Fibromyalgia    Gallstones    GERD (gastroesophageal reflux disease)    Heart murmur    "noted for the 1st time on 04/18/2018"   History of blood transfusion 07/2010   "S/P oophorectomy"   History of gout    History of hiatal hernia 1980s   "gone now" (04/18/2018)   Hyperlipidemia    Hypertension    takes Metoprolol and Enalapril daily   Hypothyroidism    takes Synthroid daily   IBS (irritable bowel syndrome)    Migraine    "nothing in the 2000s" (04/18/2018)   Mixed connective tissue disease (HCC)  NAFLD (nonalcoholic fatty liver disease)    Pneumonia    "several times" (04/18/2018)   PVC's (premature ventricular contractions)    noted on event monitor 02/2021   Rheumatoid arthritis (HCC)    "hands, elbows, shoulders, probably knees" (04/18/2018)   Scoliosis    Sjogren's syndrome (HCC)    Sleep apnea    mild - does not use cpap   Spondylosis    Type II diabetes mellitus (HCC)    takes Metformin and Hum R daily (04/18/2018)   Walker as ambulation aid    also uses wheelchair    Past Surgical History:  Procedure Laterality Date   ABDOMINAL HYSTERECTOMY  06/2005   "w/right ovariy"   AMPUTATION  Right 01/28/2020    RIGHT FOURTH AND FIFTH RAY AMPUTATION   AMPUTATION Right 01/28/2020   Procedure: RIGHT FOURTH AND FIFTH RAY AMPUTATION,;  Surgeon: [name], MD;  Location: [facility] OR;  Service: Orthopedics;  Laterality: Right;   AMPUTATION Right 06/01/2021   Procedure: RIGHT BELOW KNEE AMPUTATION;  Surgeon: [name], MD;  Location: [facility] OR;  Service: Orthopedics;  Laterality: Right;   AMPUTATION Right 07/22/2021   Procedure: RIGHT ABOVE KNEE AMPUTATION;  Surgeon: [name], MD;  Location: [facility] OR;  Service: Orthopedics;  Laterality: Right;   APPENDECTOMY     BREAST BIOPSY Bilateral    9 total (04/18/2018)   CORONARY ANGIOPLASTY WITH STENT PLACEMENT  10/01/2015   normal LM, 30% LAD, 85% mid RCA and 95% distal RCA s/p PCI of the mid to distal RCA and now on DAPT with ASA and Ticagrelor.     CORONARY STENT INTERVENTION Right 04/18/2018   Procedure: CORONARY STENT INTERVENTION;  Surgeon: [name], MD;  Location: [facility] INVASIVE CV LAB;  Service: Cardiovascular;  Laterality: Right;   DILATION AND CURETTAGE OF UTERUS     FRACTURE SURGERY     LAPAROSCOPIC CHOLECYSTECTOMY     LEFT HEART CATH AND CORONARY ANGIOGRAPHY N/A 04/18/2018   Procedure: LEFT HEART CATH AND CORONARY ANGIOGRAPHY;  Surgeon: [name], MD;  Location: [facility];  Service: Cardiovascular;  Laterality: N/A;   MUSCLE BIOPSY Left    "leg"   OOPHORECTOMY  07/2010   RADIOLOGY WITH ANESTHESIA N/A 05/25/2016   Procedure: RADIOLOGY WITH ANESTHESIA;  Surgeon: Medication Radiologist, MD;  Location: [facility] OR;  Service: Radiology;  Laterality: N/A;   STUMP REVISION Right 06/29/2021   Procedure: REVISION RIGHT BELOW KNEE AMPUTATION;  Surgeon: [name], MD;  Location: [facility] OR;  Service: Orthopedics;  Laterality: Right;   TEE WITHOUT CARDIOVERSION N/A 06/01/2021   Procedure: TRANSESOPHAGEAL ECHOCARDIOGRAM (TEE);  Surgeon: Sande Rives, MD;  Location: [facility] OR;  Service: Cardiovascular;  Laterality:  N/A;   TONSILLECTOMY AND ADENOIDECTOMY     WRIST FRACTURE SURGERY Left    "crushed it"     reports that she quit smoking about 19 years ago. Her smoking use included cigarettes. She started smoking about 39 years ago. She has a 19.4 pack-year smoking history. She has been exposed to tobacco smoke. She has never used smokeless tobacco. She reports current alcohol use. She reports that she does not currently use drugs after having used the following drugs: Marijuana.  Allergies  Allergen Reactions   Fish Allergy Anaphylaxis    INCLUDES OMEGA 3 OILS   Fish Oil Anaphylaxis and Swelling    THROAT SWELLS INCLUDES FISH AS A CLASS   Gabapentin Anxiety and Other (See Comments)    Anxiety on high doses  Other Reaction(s): Angioedema  Other reaction(s): Other (See Comments), Anxiety on high doses, Other reaction(s): Other (See Comments), Anxiety on high doses, Other reaction(s): Other (See Comments), Anxiety on high doses, Anxiety on high doses   Ipratropium-Albuterol Shortness Of Breath    Other Reaction(s): Bronchospasm, Other (See Comments)  Wheezing becomes worse, can take albuterol with ipratropium, Other reaction(s): Other (See Comments), Wheezing becomes worse, can take albuterol with ipratropium, wheezing, wheezing   Omega-3 Fatty Acids Swelling    Throat swelling   Has tolerated Glucerna nutritional drinks  Throat swelling   Peanut-Containing Drug Products Shortness Of Breath   Victoza [Liraglutide] Nausea And Vomiting    Patient reported severe, debilitating nausea   Lovastatin Other (See Comments)    EXTREME JOINT PAIN   Metronidazole Nausea Only and Swelling    Headache and shakes  Nausea and vomiting  Pt tolerating IV 06/14/22  Other Reaction(s): Other (See Comments)  Headache and shakes, Nausea and vomiting   Penicillins Other (See Comments) and Rash    UNSPECIFIED, TOLERATES KEFLEX   CHILDHOOD RX  PCN CHALLENGE PER ALLERGIST, 02/29/16.  RESULTS NOT  UPDATED.  Other Reaction(s): Other (See Comments)  Other reaction(s): Other (See Comments), Unknown childhood reaction, Childhood allergy, Unknown childhood reaction; Tolerates Keflex.  , To undergo PCN challenge with allergist 02/2016   Red Yeast Rice [Cholestin] Other (See Comments)    Muscle pain and severe joint pain.    Rosuvastatin Other (See Comments)    Extreme joint pain, to this & other statins  Other Reaction(s): Other (See Comments)  Other reaction(s): Other (See Comments), Extreme joint pain, Extreme joint pain, Extreme joint pain, Extreme joint pain, to this & other statins   Rotigotine Swelling    (Neupro) Extreme edema    Atrovent Hfa [Ipratropium Bromide Hfa] Other (See Comments)    wheezing   Iodine Swelling   Ipratropium Bromide     Other Reaction(s): Bronchospasm, Other (See Comments)  Other reaction(s): Other (See Comments), Wheezing becomes worse, wheezing, wheezing, Wheezing becomes worse   Morphine    Other Other (See Comments)    Surgical Staples causes redness/infection per patient.   Pepto-Bismol [Bismuth Subsalicylate] Nausea And Vomiting    Extreme vomiting   Wound Dressing Adhesive Other (See Comments)    Electrodes causes skin breakdown, Electrodes causes skin breakdown   Enalapril Cough   Suprep [Na Sulfate-K Sulfate-Mg Sulf] Nausea And Vomiting   Tape Other (See Comments)    Electrodes causes skin breakdown    Family History  Problem Relation Age of Onset   CAD Mother    Hypertension Mother    Heart attack Mother    Stroke Mother    CAD Father    Heart attack Father    Allergic rhinitis Father    Asthma Father    Hypertension Brother    Hypertension Brother    Pancreatic cancer Paternal Aunt    Breast cancer Paternal Aunt    Lung cancer Paternal Aunt    Allergic rhinitis Son    Asthma Son     Prior to Admission medications   Medication Sig Start Date End Date Taking? Authorizing Provider  acetaminophen (TYLENOL) 500 MG tablet  Take 500 mg by mouth every 8 (eight) hours as needed for mild pain.    [provider]  albuterol (PROVENTIL) (2.5 MG/3ML) 0.083% nebulizer solution USE 1 VIAL IN NEBULIZER EVERY 4 HOURS AS NEEDED FOR WHEEZING FOR SHORTNESS OF BREATH 09/29/22   Sharlene Dory, DO  albuterol (VENTOLIN  HFA) 108 (90 Base) MCG/ACT inhaler INHALE 2 PUFFS BY MOUTH EVERY 6 HOURS AS NEEDED FOR WHEEZING FOR SHORTNESS OF BREATH 04/26/23   Sharlene Dory, DO  aspirin EC 81 MG tablet Take by mouth. 12/22/15   [provider]  augmented betamethasone dipropionate (DIPROLENE AF) 0.05 % cream Apply 1 Application topically 2 (two) times daily as needed (rash). 01/30/23   Sharlene Dory, DO  Blood Glucose Monitoring Suppl (ONETOUCH VERIO REFLECT) w/Device KIT Use to check blood sugar 4 times daily.  DX E11.65 and Z79.4 04/12/23   Sharlene Dory, DO  cefadroxil (DURICEF) 500 MG capsule Take 1 capsule (500 mg total) by mouth 2 (two) times daily. 09/03/23   Dione Booze, MD  Continuous Glucose Sensor (DEXCOM G7 SENSOR) MISC USE TO CHECK FOR BLOOD SUGAR CONTINUOUSLY- CHANGE SENSOR EVERY 10 DAYS 05/08/23   Sharlene Dory, DO  diclofenac Sodium (PENNSAID) 2 % SOLN APPLY 2 PUMPS (40MG  TOTAL) TOPICALLY TWICE DAILY AS NEEDED 08/17/23   Wendling, Jilda Roche, DO  diclofenac Sodium (VOLTAREN) 1 % GEL Apply 2 g topically 4 (four) times daily. 06/28/23   Raulkar, Drema Pry, MD  Diclofenac Sodium 3 % GEL Apply 1 Application topically 4 (four) times daily as needed. 05/11/23   Raulkar, Drema Pry, MD  DILT-XR 240 MG 24 hr capsule Take 1 capsule (240 mg total) by mouth daily. 06/27/23   Quintella Reichert, MD  diphenhydrAMINE 25 mg in sodium chloride 0.9 % 50 mL Take by mouth. 04/05/13   [provider]  EPINEPHrine 0.3 mg/0.3 mL IJ SOAJ injection Inject 0.3 mg into the muscle as needed for anaphylaxis. Patient not taking: Reported on 09/10/2023 09/07/22   Sharlene Dory, DO   fluconazole (DIFLUCAN) 150 MG tablet Take 1 tab, repeat in 72 hours if no improvement. 09/10/23   Sharlene Dory, DO  fluticasone (FLONASE) 50 MCG/ACT nasal spray USE 2 SPRAY(S) IN EACH NOSTRIL ONCE DAILY AS NEEDED FOR ALLERGIES OR  RHINITIS 04/09/23   Sharlene Dory, DO  furosemide (LASIX) 20 MG tablet Take 2 tablets (40 mg total) by mouth daily. 09/01/22   Sharlene Dory, DO  gabapentin (NEURONTIN) 400 MG capsule Take 1 capsule (400 mg total) by mouth 4 (four) times daily. TAKE 1 CAPSULE BY MOUTH IN THE MORNING AND 1 AT NOON AND 2 AT BEDTIME Strength: 400 mg 05/11/23   Raulkar, Drema Pry, MD  gentamicin ointment (GARAMYCIN) 0.1 % USE AS DIRECTED FOR WOUND CARE 07/23/23   [provider]  Glucagon, rDNA, (GLUCAGON EMERGENCY) 1 MG KIT Inject 1 mg into the vein daily as needed. Patient not taking: Reported on 09/10/2023 05/08/23   Sharlene Dory, DO  GLUCOMANNAN PO Take 1,995 mg by mouth daily.    [provider]  guaiFENesin (MUCINEX) 600 MG 12 hr tablet Take 600 mg by mouth 2 (two) times daily as needed for cough or to loosen phlegm.    [provider]  HUMULIN R 500 UNIT/ML injection INJECT 0.08-0.3 MLS (40-150 UNITS) INTO THE SKIN THREE TIMES DAILY WITH MEALS 08/31/23   Wendling, Jilda Roche, DO  hydrocortisone cream 1 % Apply 1 application topically 3 (three) times daily as needed for itching (minor skin irritation). 06/24/21   Love, Evlyn Kanner, PA-C  INSULIN SYRINGE 1CC/29G (B-D INSULIN SYRINGE) 29G X 1/2" 1 ML MISC Use three times daily with insulin.  DX E11.9 06/11/20   Sharlene Dory, DO  isosorbide mononitrate (IMDUR) 60 MG 24 hr tablet Take  1 tablet (60 mg total) by mouth daily. 04/30/23   Swinyer, Zachary George, NP  levocetirizine (XYZAL) 5 MG tablet TAKE 1 TABLET BY MOUTH ONCE DAILY IN THE EVENING 07/16/23   Wendling, Jilda Roche, DO  levothyroxine (SYNTHROID) 200 MCG tablet Take 1 tablet (200 mcg total) by mouth daily  before breakfast. 06/27/23   Wendling, Jilda Roche, DO  lidocaine (LIDODERM) 5 % Place 1 patch onto the skin every 12 (twelve) hours. Remove & Discard patch within 12 hours or as directed 04/12/23   Jones Bales, NP  lidocaine (XYLOCAINE) 5 % ointment Apply topically as directed. 02/02/23   [provider]  metFORMIN (GLUCOPHAGE-XR) 500 MG 24 hr tablet Take 2 tablets (1,000 mg total) by mouth 2 (two) times daily. 06/27/23   Sharlene Dory, DO  Multiple Vitamins-Minerals (WOMENS 50+ MULTI VITAMIN PO) Take 1 tablet by mouth daily.    [provider]  mupirocin ointment (BACTROBAN) 2 % Apply topically 2 (two) times daily. 05/08/23   Sharlene Dory, DO  naloxone [facility]) nasal spray 4 mg/0.1 mL Place 1 spray into the nose as needed (opoid overdose).    [provider]  nitroGLYCERIN (NITROSTAT) 0.4 MG SL tablet DISSOLVE ONE TABLET UNDER THE TONGUE EVERY 5 MINUTES AS NEEDED FOR CHEST PAIN.  DO NOT EXCEED A TOTAL OF 3 DOSES IN 15 MINUTES Patient not taking: Reported on 09/10/2023 08/23/21   Quintella Reichert, MD  nystatin (MYCOSTATIN/NYSTOP) powder Apply 1 Application topically 3 (three) times daily. R breast 09/10/23   Sharlene Dory, DO  omeprazole (PRILOSEC) 40 MG capsule Take 1 capsule by mouth once daily 08/24/23   Carmelia Roller, Jilda Roche, DO  OneTouch Delica Lancets 33G MISC Use to check blood glucose up to 4 times a day 11/16/22   Sharlene Dory, DO  [facility] VERIO test strip Use as instructed 06/27/23   Sharlene Dory, DO  Oxycodone HCl 20 MG TABS Take 1 tablet (20 mg total) by mouth every 8 (eight) hours as needed. 08/16/23   Jones Bales, NP  oxymetazoline (AFRIN) 0.05 % nasal spray Place 2 sprays into both nostrils 2 (two) times daily as needed for congestion.    [provider]  Polyethyl Glyc-Propyl Glyc PF (SYSTANE PRESERVATIVE FREE) 0.4-0.3 % SOLN Apply to eye. 05/11/16   [provider]  pramipexole  (MIRAPEX) 1 MG tablet Take 1 tablet (1 mg total) by mouth 3 (three) times daily. 08/08/23   Saguier, Ramon Dredge, PA-C  pregabalin (LYRICA) 50 MG capsule Take 1 capsule (50 mg total) by mouth 3 (three) times daily. 05/11/23   Raulkar, Drema Pry, MD  promethazine (PHENERGAN) 25 MG tablet Take 1 tablet (25 mg total) by mouth every 8 (eight) hours as needed for vomiting or nausea. 04/20/23   Sharlene Dory, DO  saccharomyces boulardii (FLORASTOR) 250 MG capsule Take 250 mg by mouth 3 (three) times daily.    [provider]  Sodium Hyaluronate, oral, (HYALURONIC ACID PO)     [provider]  Vitamin D, Ergocalciferol, (DRISDOL) 1.25 MG (50000 UNIT) CAPS capsule TAKE 1 CAPSULE BY MOUTH ONCE A WEEK ON  FRIDAYS 06/27/23   Sharlene Dory, DO  Wound Dressings (ELTA DERMAL WOUND DRESSING EX) Apply topically.    [provider]    Physical Exam: Vitals:   09/10/23 1317 09/10/23 1321 09/10/23 1623 09/10/23 1926  BP: 121/60  (!) 130/111 (!) 143/83  Pulse: (!) 107  74 (!) 106  Resp: (!) 22  (!)  24 16  Temp: 97.9 F (36.6 C)  98.4 F (36.9 C) 97.9 F (36.6 C)  TempSrc: Oral  Oral Oral  SpO2: 99%  98% 97%  Weight:  132.5 kg    Height:  5' 2.5" (1.588 m)      Constitutional: NAD, calm, comfortable Vitals:   09/10/23 1317 09/10/23 1321 09/10/23 1623 09/10/23 1926  BP: 121/60  (!) 130/111 (!) 143/83  Pulse: (!) 107  74 (!) 106  Resp: (!) 22  (!) 24 16  Temp: 97.9 F (36.6 C)  98.4 F (36.9 C) 97.9 F (36.6 C)  TempSrc: Oral  Oral Oral  SpO2: 99%  98% 97%  Weight:  132.5 kg    Height:  5' 2.5" (1.588 m)     Eyes: PERRL, lids and conjunctivae normal, morbidly obese ENMT: Mucous membranes are moist. Posterior pharynx clear of any exudate or lesions.Normal dentition.  Neck: normal, supple, no masses, no thyromegaly Respiratory: clear to auscultation bilaterally, no wheezing, no crackles. Normal respiratory effort. No accessory muscle use.  Cardiovascular:  Regular rate and rhythm, no murmurs / rubs / gallops. No extremity edema. 2+ pedal pulses. No carotid bruits.  Abdomen: no tenderness, no masses palpated. No hepatosplenomegaly. Bowel sounds positive.  Musculoskeletal: no clubbing / cyanosis. No joint deformity upper and lower extremities. Good ROM, no contractures. Normal muscle tone.  Right AKA Skin: Significant erythema with induration and weeping at the left lower extremity and has erythema as well as whitish discharge consistent with fungal infection under right breast and between both breasts. Neurologic: CN 2-12 grossly intact. Sensation intact, DTR normal. Strength 5/5 in all 4.  Psychiatric: Normal judgment and insight. Alert and oriented x 3. Normal mood.    Labs on Admission: I have personally reviewed following labs and imaging studies  CBC: Recent Labs  Lab 09/10/23 1434  WBC 10.1  NEUTROABS 7.0  HGB 8.2*  HCT 32.1*  MCV 67.6*  PLT 324   Basic Metabolic Panel: Recent Labs  Lab 09/04/23 0905 09/10/23 1434  NA 140 140  K 4.1 4.6  CL 101 104  CO2 27 22  GLUCOSE 150* 180*  BUN 12 13  CREATININE 0.83 0.80  CALCIUM 8.5 9.1  MG 1.7  --    GFR: Estimated Creatinine Clearance: 96.5 mL/min (by C-G formula based on SCr of 0.8 mg/dL). Liver Function Tests: Recent Labs  Lab 09/10/23 1434  AST 32  ALT 23  ALKPHOS 88  BILITOT 0.6  PROT 7.2  ALBUMIN 3.2*   No results for input(s): "LIPASE", "AMYLASE" in the last 168 hours. No results for input(s): "AMMONIA" in the last 168 hours. Coagulation Profile: No results for input(s): "INR", "PROTIME" in the last 168 hours. Cardiac Enzymes: Recent Labs  Lab 09/10/23 1434  CKTOTAL 121   BNP (last 3 results) No results for input(s): "PROBNP" in the last 8760 hours. HbA1C: No results for input(s): "HGBA1C" in the last 72 hours. CBG: No results for input(s): "GLUCAP" in the last 168 hours. Lipid Profile: No results for input(s): "CHOL", "HDL", "LDLCALC", "TRIG",  "CHOLHDL", "LDLDIRECT" in the last 72 hours. Thyroid Function Tests: No results for input(s): "TSH", "T4TOTAL", "FREET4", "T3FREE", "THYROIDAB" in the last 72 hours. Anemia Panel: No results for input(s): "VITAMINB12", "FOLATE", "FERRITIN", "TIBC", "IRON", "RETICCTPCT" in the last 72 hours. Urine analysis:    Component Value Date/Time   COLORURINE YELLOW 06/21/2021 0855   APPEARANCEUR CLOUDY (A) 06/21/2021 0855   LABSPEC 1.005 06/21/2021 0855   PHURINE 5.0 06/21/2021 0855  GLUCOSEU NEGATIVE 06/21/2021 0855   HGBUR NEGATIVE 06/21/2021 0855   BILIRUBINUR NEGATIVE 06/21/2021 0855   KETONESUR NEGATIVE 06/21/2021 0855   PROTEINUR NEGATIVE 06/21/2021 0855   UROBILINOGEN 0.2 02/10/2015 1835   NITRITE NEGATIVE 06/21/2021 0855   LEUKOCYTESUR LARGE (A) 06/21/2021 0855    Radiological Exams on Admission: CT CHEST WO CONTRAST Result Date: 09/10/2023 CLINICAL DATA:  History of recent falls with chest and abdominal pain, initial encounter EXAM: CT CHEST, ABDOMEN AND PELVIS WITHOUT CONTRAST TECHNIQUE: Multidetector CT imaging of the chest, abdomen and pelvis was performed following the standard protocol without IV contrast. RADIATION DOSE REDUCTION: This exam was performed according to the departmental dose-optimization program which includes automated exposure control, adjustment of the mA and/or kV according to patient size and/or use of iterative reconstruction technique. COMPARISON:  Chest x-ray from earlier in the same day. FINDINGS: CT CHEST FINDINGS Cardiovascular: Somewhat limited due to lack of IV contrast. Atherosclerotic calcifications of the thoracic aorta are noted. Mild cardiac enlargement is seen. Heavy coronary calcifications are noted. Mediastinum/Nodes: Thoracic inlet is within normal limits. No hilar or mediastinal adenopathy is noted. The esophagus as visualized is within normal limits. Lungs/Pleura: Lungs are well aerated bilaterally. Azygous lobe is seen. Small right-sided pleural  effusion is noted with patchy atelectatic changes in the right base. No focal confluent infiltrate is seen. Musculoskeletal: Degenerative changes of the thoracic spine are noted. No acute rib abnormality is seen. Soft tissues edema is noted throughout the right breast without definitive abscess. CT ABDOMEN PELVIS FINDINGS Hepatobiliary: No focal liver abnormality is seen. Status post cholecystectomy. No biliary dilatation. Pancreas: Unremarkable. No pancreatic ductal dilatation or surrounding inflammatory changes. Spleen: Normal in size without focal abnormality. Adrenals/Urinary Tract: Adrenal glands are within normal limits. Kidneys are well visualized bilaterally. No renal calculi or obstructive changes are noted. The bladder is well distended. Stomach/Bowel: No obstructive or inflammatory changes of the colon are seen. The appendix is within normal limits. Small bowel and stomach are unremarkable. Vascular/Lymphatic: Aortic atherosclerosis. No enlarged abdominal or pelvic lymph nodes. Reproductive: Status post hysterectomy. No adnexal masses. Other: No abdominal wall hernia or abnormality. No abdominopelvic ascites. Musculoskeletal: No acute bony abnormality is noted in the abdomen and pelvis. Degenerative changes of lumbar spine are seen. Similar subcutaneous edema and skin thickening is noted in the abdominal wall and pelvis which may represent some generalized cellulitis. Correlation with the physical exam is recommended. IMPRESSION: CT of the chest: Patchy atelectatic changes in the right base. Small effusion is noted on the right as well. Considerable subcutaneous edema within the right breast consistent with cellulitis. CT of the abdomen and pelvis: Considerable subcutaneous edema in the abdominal wall and gluteal regions similar to that seen in the right breast again suspicious for cellulitis. Diffuse skin thickening is noted. Electronically Signed   By: Alcide Clever M.D.   On: 09/10/2023 18:59   CT  ABDOMEN PELVIS WO CONTRAST Result Date: 09/10/2023 CLINICAL DATA:  History of recent falls with chest and abdominal pain, initial encounter EXAM: CT CHEST, ABDOMEN AND PELVIS WITHOUT CONTRAST TECHNIQUE: Multidetector CT imaging of the chest, abdomen and pelvis was performed following the standard protocol without IV contrast. RADIATION DOSE REDUCTION: This exam was performed according to the departmental dose-optimization program which includes automated exposure control, adjustment of the mA and/or kV according to patient size and/or use of iterative reconstruction technique. COMPARISON:  Chest x-ray from earlier in the same day. FINDINGS: CT CHEST FINDINGS Cardiovascular: Somewhat limited due to lack of IV contrast.  Atherosclerotic calcifications of the thoracic aorta are noted. Mild cardiac enlargement is seen. Heavy coronary calcifications are noted. Mediastinum/Nodes: Thoracic inlet is within normal limits. No hilar or mediastinal adenopathy is noted. The esophagus as visualized is within normal limits. Lungs/Pleura: Lungs are well aerated bilaterally. Azygous lobe is seen. Small right-sided pleural effusion is noted with patchy atelectatic changes in the right base. No focal confluent infiltrate is seen. Musculoskeletal: Degenerative changes of the thoracic spine are noted. No acute rib abnormality is seen. Soft tissues edema is noted throughout the right breast without definitive abscess. CT ABDOMEN PELVIS FINDINGS Hepatobiliary: No focal liver abnormality is seen. Status post cholecystectomy. No biliary dilatation. Pancreas: Unremarkable. No pancreatic ductal dilatation or surrounding inflammatory changes. Spleen: Normal in size without focal abnormality. Adrenals/Urinary Tract: Adrenal glands are within normal limits. Kidneys are well visualized bilaterally. No renal calculi or obstructive changes are noted. The bladder is well distended. Stomach/Bowel: No obstructive or inflammatory changes of the colon  are seen. The appendix is within normal limits. Small bowel and stomach are unremarkable. Vascular/Lymphatic: Aortic atherosclerosis. No enlarged abdominal or pelvic lymph nodes. Reproductive: Status post hysterectomy. No adnexal masses. Other: No abdominal wall hernia or abnormality. No abdominopelvic ascites. Musculoskeletal: No acute bony abnormality is noted in the abdomen and pelvis. Degenerative changes of lumbar spine are seen. Similar subcutaneous edema and skin thickening is noted in the abdominal wall and pelvis which may represent some generalized cellulitis. Correlation with the physical exam is recommended. IMPRESSION: CT of the chest: Patchy atelectatic changes in the right base. Small effusion is noted on the right as well. Considerable subcutaneous edema within the right breast consistent with cellulitis. CT of the abdomen and pelvis: Considerable subcutaneous edema in the abdominal wall and gluteal regions similar to that seen in the right breast again suspicious for cellulitis. Diffuse skin thickening is noted. Electronically Signed   By: Alcide Clever M.D.   On: 09/10/2023 18:59   CT L-SPINE NO CHARGE Result Date: 09/10/2023 CLINICAL DATA:  Fall.  Leg pain. EXAM: CT LUMBAR SPINE WITHOUT CONTRAST TECHNIQUE: Multidetector CT imaging of the lumbar spine was performed without intravenous contrast administration. Multiplanar CT image reconstructions were also generated. RADIATION DOSE REDUCTION: This exam was performed according to the departmental dose-optimization program which includes automated exposure control, adjustment of the mA and/or kV according to patient size and/or use of iterative reconstruction technique. COMPARISON:  MRI lumbar spine 05/25/2016. FINDINGS: Segmentation: Conventional numbering is assumed with 5 non-rib-bearing, lumbar type vertebral bodies. Alignment: Unchanged grade 1 anterolisthesis of L4 on L5. Levoscoliotic curvature of the lumbar spine. Vertebrae: Normal  vertebral body heights. No acute fracture or suspicious bone lesion. Multilevel degenerative endplate changes. Paraspinal and other soft tissues: Atherosclerotic calcifications of the abdominal aorta and its branches. Disc levels: T12-L1: Right-greater-than-left facet arthropathy. No spinal canal stenosis or neural foraminal narrowing. L1-L2: Disc bulge and facet arthropathy results in mild spinal canal stenosis and moderate right neural foraminal narrowing. L2-L3: Left eccentric disc bulge and right eccentric endplate osteophytes results in mild spinal canal stenosis and moderate right neural foraminal narrowing. L3-L4: Disc bulge and right-greater-than-left facet arthropathy results in mild spinal canal stenosis, severe right and moderate left neural foraminal narrowing. L4-L5: Acquired fusion. No spinal canal stenosis or neural foraminal narrowing. L5-S1: No disc herniation. Severe left-greater-than-right facet arthropathy results in severe left and moderate right neural foraminal narrowing. IMPRESSION: 1. No acute fracture or traumatic listhesis of the lumbar spine. 2. Multilevel degenerative changes of the lumbar spine, as described  above. Aortic Atherosclerosis (ICD10-I70.0). Electronically Signed   By: Orvan Falconer M.D.   On: 09/10/2023 17:08   DG Shoulder Right Portable Result Date: 09/10/2023 CLINICAL DATA:  Fall EXAM: RIGHT SHOULDER - 1 VIEW COMPARISON:  None available FINDINGS: No fracture or dislocation. Irregularity of the greater tuberosity indicative of rotator cuff tendinopathy. Mild right acromioclavicular osteoarthrosis. Advanced degenerative changes of the visualized cervical spine. Examination limited due to under penetration. IMPRESSION: No acute abnormality of the right shoulder. Electronically Signed   By: Acquanetta Belling M.D.   On: 09/10/2023 15:46   DG Chest Portable 1 View Result Date: 09/10/2023 CLINICAL DATA:  Fall EXAM: PORTABLE CHEST - 1 VIEW COMPARISON:  09/02/2023 FINDINGS:  Unchanged cardiomegaly.  No pulmonary vascular congestion. Minimal right basilar atelectasis, similar to prior examination. Lungs are otherwise clear. Overall examination limited due to underpenetration. IMPRESSION: Limited examination.  Mild right basilar atelectasis. Electronically Signed   By: Acquanetta Belling M.D.   On: 09/10/2023 15:45    EKG: Independently reviewed.  Sinus tachycardia, I did not appreciate any ST elevation.  Patient has no ACS symptoms.  Assessment/Plan Principal Problem:   Cellulitis Active Problems:   Sepsis (HCC)   Sepsis secondary to left lower extremity cellulitis and right breast cellulitis, POA: Patient met criteria of sepsis based on the tachycardia and tachypnea, no leukocytosis and lactic acid is normal and she is afebrile.  She has received 1 L of IV fluid bolus, will give her Ringer's lactate at 150 cc/h for 20 hours.  She has failed outpatient oral antibiotics of cefadroxil.  She received Rocephin and vancomycin.  I will start her on vancomycin.  She appears to have fungal infection of the breast.  Will cover that with Diflucan.  Blood cultures have been drawn.  Type 2 diabetes mellitus: Patient appears to be taking metformin and concentrated Humulin R 50 to 150 units 3 times daily with meals.  I have resumed concentrated insulin at 50 units 3 times daily with meal, hold metformin, placed on SSI.  Essential hypertension: Blood pressure was elevated at 1 time.  Resume Imdur and monitor closely.  Acquired hypothyroidism: Check TSH, resume Synthroid.  Mild intermittent asthma: Patient is asymptomatic.  No wheezes on examination.  Resume home inhalers.  Respiratory insufficiency: Mildly hypoxic at 89%, likely secondary to atelectasis as shown on the CT scan.  Encouraged and provide incentive spirometry.  GERD: Continue PPI.  History of congestive heart failure with preserved ejection fraction and left lower extremity lymphedema: Resume home dose of Lasix 40 mg  p.o. daily.  History of right AKA: Noted.  Morbid obesity class III: BMI 56.  Weight loss and diet modification counseled.  DVT prophylaxis: enoxaparin (LOVENOX) injection 40 mg Start: 09/10/23 2015 Code Status: Full code Family Communication: Husband present at bedside.  Plan of care discussed with patient in length and he verbalized understanding and agreed with it. Disposition Plan: Will need hospitalization for IV antibiotics for 2 to 3 days at least Consults called: None  Hughie Closs MD [facility]  *Please note that this is a verbal dictation therefore any spelling or grammatical errors are due to the "Dragon [employer] One" system interpretation.  Please page via Amion and do not message via secure chat for urgent patient care matters. Secure chat can be used for non urgent patient care matters. 09/10/2023, 8:01 PM  To contact the attending provider between 7A-7P or the covering provider during after hours 7P-7A, please log into the web site [email or web address]

## 2023-09-10 NOTE — Progress Notes (Signed)
Pharmacy Antibiotic Note  [name] is a 62 y.o. female admitted on 09/10/2023 with cellulitis.  Pharmacy has been consulted for vancomycin dosing.  Plan: Vancomycin 1 gm IV x 2 doses = 2 gram loading dose Vancomycin 1750 mg IV q24 for eAUC 495, Scr 0.8, Vd 0.5, Css min 11.3  Height: 5' 2.5" (158.8 cm) Weight: 132.5 kg (292 lb) IBW/kg (Calculated) : 51.25  Temp (24hrs), Avg:98.1 F (36.7 C), Min:97.9 F (36.6 C), Max:98.4 F (36.9 C)  Recent Labs  Lab 09/04/23 0905 09/10/23 1434  WBC  --  10.1  CREATININE 0.83 0.80    Estimated Creatinine Clearance: 96.5 mL/min (by C-G formula based on SCr of 0.8 mg/dL).    Allergies  Allergen Reactions   Fish Allergy Anaphylaxis    INCLUDES OMEGA 3 OILS   Fish Oil Anaphylaxis and Swelling    THROAT SWELLS INCLUDES FISH AS A CLASS   Gabapentin Anxiety and Other (See Comments)    Anxiety on high doses  Other Reaction(s): Angioedema  Other reaction(s): Other (See Comments), Anxiety on high doses, Other reaction(s): Other (See Comments), Anxiety on high doses, Other reaction(s): Other (See Comments), Anxiety on high doses, Anxiety on high doses   Ipratropium-Albuterol Shortness Of Breath    Other Reaction(s): Bronchospasm, Other (See Comments)  Wheezing becomes worse, can take albuterol with ipratropium, Other reaction(s): Other (See Comments), Wheezing becomes worse, can take albuterol with ipratropium, wheezing, wheezing   Omega-3 Fatty Acids Swelling    Throat swelling   Has tolerated Glucerna nutritional drinks  Throat swelling   Peanut-Containing Drug Products Shortness Of Breath   Victoza [Liraglutide] Nausea And Vomiting    Patient reported severe, debilitating nausea   Lovastatin Other (See Comments)    EXTREME JOINT PAIN   Metronidazole Nausea Only and Swelling    Headache and shakes  Nausea and vomiting  Pt tolerating IV 06/14/22  Other Reaction(s): Other (See Comments)  Headache and shakes, Nausea and  vomiting   Penicillins Other (See Comments) and Rash    UNSPECIFIED, TOLERATES KEFLEX   CHILDHOOD RX  PCN CHALLENGE PER ALLERGIST, 02/29/16.  RESULTS NOT UPDATED.  Other Reaction(s): Other (See Comments)  Other reaction(s): Other (See Comments), Unknown childhood reaction, Childhood allergy, Unknown childhood reaction; Tolerates Keflex.  , To undergo PCN challenge with allergist 02/2016   Red Yeast Rice [Cholestin] Other (See Comments)    Muscle pain and severe joint pain.    Rosuvastatin Other (See Comments)    Extreme joint pain, to this & other statins  Other Reaction(s): Other (See Comments)  Other reaction(s): Other (See Comments), Extreme joint pain, Extreme joint pain, Extreme joint pain, Extreme joint pain, to this & other statins   Rotigotine Swelling    (Neupro) Extreme edema    Atrovent Hfa [Ipratropium Bromide Hfa] Other (See Comments)    wheezing   Iodine Swelling   Ipratropium Bromide     Other Reaction(s): Bronchospasm, Other (See Comments)  Other reaction(s): Other (See Comments), Wheezing becomes worse, wheezing, wheezing, Wheezing becomes worse   Morphine    Other Other (See Comments)    Surgical Staples causes redness/infection per patient.   Pepto-Bismol [Bismuth Subsalicylate] Nausea And Vomiting    Extreme vomiting   Wound Dressing Adhesive Other (See Comments)    Electrodes causes skin breakdown, Electrodes causes skin breakdown   Enalapril Cough   Suprep [Na Sulfate-K Sulfate-Mg Sulf] Nausea And Vomiting   Tape Other (See Comments)    Electrodes causes skin breakdown  Thank you for allowing pharmacy to be a part of this patient's care.  Herby Abraham, Pharm.D Use secure chat for questions 09/10/2023 8:53 PM

## 2023-09-10 NOTE — ED Triage Notes (Signed)
Patient BIB EMS for fall. EMS reports patient was on the floor all night. C/o shoulder and leg pain. On EMS arrival patient was 79% RA. Place on 5L O2 and now 99%. Recently dx with cellulitis of the right leg.

## 2023-09-10 NOTE — Telephone Encounter (Signed)
Appt today

## 2023-09-10 NOTE — Progress Notes (Signed)
Chief Complaint  Patient presents with   Fall   Medical Management of Chronic Issues    Subjective: Patient is a 62 y.o. female here for follow-up. We are interacting via web portal for an electronic face-to-face visit. I verified patient's ID using 2 identifiers. Patient agreed to proceed with visit via this method. Patient is at home, I am at office. Patient, her husband [name] and I are present for visit.   The patient had a slow slide off of her wheelchair yesterday.  Her husband cannot physically get her up by himself.  She is swollen because she has difficulty getting to the restroom and stopped taking her water pill.  She feels weak because of the retained water as well.  Patient and spouse are requesting help for what to do to help her use the restroom and avoid falls.  Over the past week and a half, she has had redness under her L breast.  She went to the ED on 12/23 and was prescribed Duricef.  She has not noticed much improvement despite compliance with the medication.  No fevers.  She has a history of intertrigo but has run out of nystatin.  She also developed a yeast infection from the [facility] and is requesting Diflucan.  Past Medical History:  Diagnosis Date   Aortic stenosis    mild AS by echo 02/2021   Asthma    "on daily RX and rescue inhaler" (04/18/2018)   Chronic diastolic CHF (congestive heart failure) (HCC) 09/2015   Chronic lower back pain    Chronic neck pain    Chronic pain syndrome    Fentanyl Patch   Colon polyps    Coronary artery disease    cath with normal LM, 30% LAD, 85% mid RCA and 95% distal RCA s/p PCI of the mid to distal RCA and now on DAPT with ASA and Ticagrelor.     Eczema    Excessive daytime sleepiness 11/26/2015   Fibromyalgia    Gallstones    GERD (gastroesophageal reflux disease)    Heart murmur    "noted for the 1st time on 04/18/2018"   History of blood transfusion 07/2010   "S/P oophorectomy"   History of gout    History of hiatal hernia  1980s   "gone now" (04/18/2018)   Hyperlipidemia    Hypertension    takes Metoprolol and Enalapril daily   Hypothyroidism    takes Synthroid daily   IBS (irritable bowel syndrome)    Migraine    "nothing in the 2000s" (04/18/2018)   Mixed connective tissue disease (HCC)    NAFLD (nonalcoholic fatty liver disease)    Pneumonia    "several times" (04/18/2018)   PVC's (premature ventricular contractions)    noted on event monitor 02/2021   Rheumatoid arthritis (HCC)    "hands, elbows, shoulders, probably knees" (04/18/2018)   Scoliosis    Sjogren's syndrome (HCC)    Sleep apnea    mild - does not use cpap   Spondylosis    Type II diabetes mellitus (HCC)    takes Metformin and Hum R daily (04/18/2018)   Walker as ambulation aid    also uses wheelchair    Objective: Ht 5' 2.5" (1.588 m)   Wt 292 lb (132.5 kg)   BMI 52.56 kg/m  No conversational dyspnea Age appropriate judgment and insight Nml affect and mood  Assessment and Plan: Polypharmacy - Plan: AMB Referral [facility] Care Management  Skin irritation - Plan: nystatin (MYCOSTATIN/NYSTOP) powder  Yeast vaginitis - Plan: fluconazole (DIFLUCAN) 150 MG tablet  We will set her up with social work to help with this situation.  I am not sure if someone coming in to help would be more beneficial or if they have any other recommendation.  Family is in agreement with this. Refill nystatin.  This will likely help with intertrigo. Diflucan, repeat in 72 hours if no improvement. The patient and her spouse voiced understanding and agreement to the plan.  I spent 31 minutes with the patient and her spouse discussing the above plans in addition to reviewing her chart on the same day of the visit.  Jilda Roche Rosa Sanchez, DO 09/10/23  12:15 PM

## 2023-09-10 NOTE — Progress Notes (Signed)
See note from [place] last week. Following up from recent hospital visit and abnormal labs.   From recent phone call:  Patient's husband called in stating that patient had a fall last night and he is unable to get her up. Husband reports patient attempted to ambulate to the restroom and slid down the wall, landing on her buttocks. Husband denies any injury/ concerns. Husband states that he would like to reschedule in office visit today to a virtual visit due to inability to easily transport patient. Husband would specifically like to discuss care options moving forward as he feels he is having a hard time managing patient's care needs at this time. Virtual video visit scheduled today 12/30. Husband calling EMS to help patient back up in the meantime. Husband advised to call back if worsening condition/symptoms before video visit. Husband verbalized understanding.

## 2023-09-11 ENCOUNTER — Encounter: Payer: Commercial Managed Care - HMO | Admitting: Registered Nurse

## 2023-09-11 ENCOUNTER — Inpatient Hospital Stay (HOSPITAL_COMMUNITY): Payer: Commercial Managed Care - HMO

## 2023-09-11 DIAGNOSIS — L03313 Cellulitis of chest wall: Secondary | ICD-10-CM

## 2023-09-11 LAB — COMPREHENSIVE METABOLIC PANEL
ALT: 22 U/L (ref 0–44)
AST: 27 U/L (ref 15–41)
Albumin: 2.8 g/dL — ABNORMAL LOW (ref 3.5–5.0)
Alkaline Phosphatase: 80 U/L (ref 38–126)
Anion gap: 9 (ref 5–15)
BUN: 10 mg/dL (ref 8–23)
CO2: 23 mmol/L (ref 22–32)
Calcium: 8.1 mg/dL — ABNORMAL LOW (ref 8.9–10.3)
Chloride: 97 mmol/L — ABNORMAL LOW (ref 98–111)
Creatinine, Ser: 0.7 mg/dL (ref 0.44–1.00)
GFR, Estimated: >60 mL/min (ref >60)
Glucose, Bld: 241 mg/dL — ABNORMAL HIGH (ref 70–99)
Potassium: 4.3 mmol/L (ref 3.5–5.1)
Sodium: 129 mmol/L — ABNORMAL LOW (ref 135–145)
Total Bilirubin: 0.6 mg/dL (ref 0.0–1.2)
Total Protein: 6.5 g/dL (ref 6.5–8.1)

## 2023-09-11 LAB — CBC
HCT: 31.5 % — ABNORMAL LOW (ref 36.0–46.0)
Hemoglobin: 7.9 g/dL — ABNORMAL LOW (ref 12.0–15.0)
MCH: 17.6 pg — ABNORMAL LOW (ref 26.0–34.0)
MCHC: 25.1 g/dL — ABNORMAL LOW (ref 30.0–36.0)
MCV: 70.2 fL — ABNORMAL LOW (ref 80.0–100.0)
Platelets: 280 10*3/uL (ref 150–400)
RBC: 4.49 MIL/uL (ref 3.87–5.11)
RDW: 23.6 % — ABNORMAL HIGH (ref 11.5–15.5)
WBC: 9.5 10*3/uL (ref 4.0–10.5)
nRBC: 0.3 % — ABNORMAL HIGH (ref 0.0–0.2)

## 2023-09-11 LAB — GLUCOSE, CAPILLARY
Glucose-Capillary: 195 mg/dL — ABNORMAL HIGH (ref 70–99)
Glucose-Capillary: 273 mg/dL — ABNORMAL HIGH (ref 70–99)
Glucose-Capillary: 168 mg/dL — ABNORMAL HIGH (ref 70–99)
Glucose-Capillary: 218 mg/dL — ABNORMAL HIGH (ref 70–99)

## 2023-09-11 LAB — CORTISOL-AM, BLOOD: Cortisol - AM: 8.7 ug/dL (ref 6.7–22.6)

## 2023-09-11 LAB — T4, FREE: Free T4: 1.47 ng/dL — ABNORMAL HIGH (ref 0.61–1.12)

## 2023-09-11 LAB — HEMOGLOBIN A1C
Hgb A1c MFr Bld: 8 % — ABNORMAL HIGH (ref 4.8–5.6)
Mean Plasma Glucose: 183 mg/dL

## 2023-09-11 LAB — PROTIME-INR
INR: 1.2 (ref 0.8–1.2)
Prothrombin Time: 15 s (ref 11.4–15.2)

## 2023-09-11 LAB — MRSA NEXT GEN BY PCR, NASAL: MRSA by PCR Next Gen: DETECTED — AB

## 2023-09-11 LAB — HIV ANTIBODY (ROUTINE TESTING W REFLEX): HIV Screen 4th Generation wRfx: NONREACTIVE

## 2023-09-11 MED ORDER — NYSTATIN 100000 UNIT/GM EX POWD
Freq: Three times a day (TID) | CUTANEOUS | Status: DC
  Administered 2023-09-15: 1 via TOPICAL
  Filled 2023-09-11: qty 15

## 2023-09-11 MED ORDER — INSULIN REGULAR HUMAN (CONC) 500 UNIT/ML ~~LOC~~ SOLN: 5.0000 [IU] | Freq: Three times a day (TID) | SUBCUTANEOUS | Status: DC

## 2023-09-11 MED ORDER — GERHARDT'S BUTT CREAM
TOPICAL_CREAM | Freq: Two times a day (BID) | CUTANEOUS | Status: DC
  Administered 2023-09-14 – 2023-09-25 (×7): 1 via TOPICAL
  Filled 2023-09-11 (×2): qty 60

## 2023-09-11 MED ORDER — SODIUM CHLORIDE 0.9 % IV SOLN
2.0000 g | INTRAVENOUS | Status: AC
  Administered 2023-09-11 – 2023-09-17 (×7): 2 g via INTRAVENOUS
  Filled 2023-09-11 (×7): qty 20

## 2023-09-11 MED ORDER — INSULIN REGULAR HUMAN (CONC) 500 UNIT/ML ~~LOC~~ SOPN
5.0000 [IU] | PEN_INJECTOR | Freq: Three times a day (TID) | SUBCUTANEOUS | Status: DC
  Administered 2023-09-11 – 2023-09-20 (×27): 5 [IU] via SUBCUTANEOUS
  Filled 2023-09-11 (×2): qty 3

## 2023-09-11 MED ORDER — OXYCODONE HCL 5 MG PO TABS
20.0000 mg | ORAL_TABLET | Freq: Three times a day (TID) | ORAL | Status: DC | PRN
  Administered 2023-09-11 – 2023-09-17 (×17): 20 mg via ORAL
  Filled 2023-09-11 (×17): qty 4

## 2023-09-11 MED ORDER — MUPIROCIN 2 % EX OINT
1.0000 | TOPICAL_OINTMENT | Freq: Two times a day (BID) | CUTANEOUS | Status: AC
  Administered 2023-09-11 – 2023-09-15 (×10): 1 via NASAL
  Filled 2023-09-11: qty 22

## 2023-09-11 MED ORDER — FUROSEMIDE 10 MG/ML IJ SOLN
40.0000 mg | Freq: Once | INTRAMUSCULAR | Status: AC
  Administered 2023-09-11: 40 mg via INTRAVENOUS
  Filled 2023-09-11: qty 4

## 2023-09-11 MED ORDER — GABAPENTIN 400 MG PO CAPS
800.0000 mg | ORAL_CAPSULE | Freq: Every day | ORAL | Status: DC
  Administered 2023-09-11 – 2023-09-30 (×21): 800 mg via ORAL
  Filled 2023-09-11 (×21): qty 2

## 2023-09-11 MED ORDER — MUPIROCIN 2 % EX OINT
TOPICAL_OINTMENT | Freq: Two times a day (BID) | CUTANEOUS | Status: DC
  Administered 2023-09-15 – 2023-09-25 (×5): 1 via TOPICAL
  Filled 2023-09-11 (×5): qty 22

## 2023-09-11 MED ORDER — ENOXAPARIN SODIUM 60 MG/0.6ML IJ SOSY
60.0000 mg | PREFILLED_SYRINGE | INTRAMUSCULAR | Status: DC
  Administered 2023-09-11 – 2023-09-14 (×4): 60 mg via SUBCUTANEOUS
  Filled 2023-09-11 (×4): qty 0.6

## 2023-09-11 MED ORDER — CHLORHEXIDINE GLUCONATE CLOTH 2 % EX PADS
6.0000 | MEDICATED_PAD | Freq: Every day | CUTANEOUS | Status: AC
  Administered 2023-09-12 – 2023-09-16 (×5): 6 via TOPICAL

## 2023-09-11 MED ORDER — OXYCODONE HCL 5 MG PO TABS
20.0000 mg | ORAL_TABLET | Freq: Once | ORAL | Status: AC | PRN
  Administered 2023-09-11: 20 mg via ORAL
  Filled 2023-09-11: qty 4

## 2023-09-11 MED ORDER — FERROUS SULFATE 325 (65 FE) MG PO TABS
325.0000 mg | ORAL_TABLET | Freq: Two times a day (BID) | ORAL | Status: DC
  Administered 2023-09-11 – 2023-10-01 (×39): 325 mg via ORAL
  Filled 2023-09-11 (×39): qty 1

## 2023-09-11 NOTE — Progress Notes (Deleted)
 Subjective:    Patient ID: [name], female    DOB: [birth year], 62 y.o.   MRN: [id number]  HPI Pain Inventory Average Pain {NUMBERS; 0-10:5044} Pain Right Now {NUMBERS; 0-10:5044} My pain is {PAIN DESCRIPTION:21022940}  In the last 24 hours, has pain interfered with the following? General activity {NUMBERS; 0-10:5044} Relation with others {NUMBERS; 0-10:5044} Enjoyment of life {NUMBERS; 0-10:5044} What TIME of day is your pain at its worst? {time of day:24191} Sleep (in general) {BHH GOOD/FAIR/POOR:22877}  Pain is worse with: {ACTIVITIES:21022942} Pain improves with: {PAIN IMPROVES TPUY:78977056} Relief from Meds: {NUMBERS; 0-10:5044}  Family History  Problem Relation Age of Onset   CAD Mother    Hypertension Mother    Heart attack Mother    Stroke Mother    CAD Father    Heart attack Father    Allergic rhinitis Father    Asthma Father    Hypertension Brother    Hypertension Brother    Pancreatic cancer Paternal Aunt    Breast cancer Paternal Aunt    Lung cancer Paternal Aunt    Allergic rhinitis Son    Asthma Son    Social History   Socioeconomic History   Marital status: Married    Spouse name: Garrel Dominey   Number of children: 1   Years of education: 11   Highest education level: Associate degree: occupational, scientist, product/process development, or vocational program  Occupational History    Comment: Not currrently working ( last worked 2004)  Tobacco Use   Smoking status: Former    Current packs/day: 0.00    Average packs/day: 1 pack/day for 19.4 years (19.4 ttl pk-yrs)    Types: Cigarettes    Start date: 77    Quit date: 02/11/2004    Years since quitting: 19.5    Passive exposure: Past   Smokeless tobacco: Never  Vaping Use   Vaping status: Never Used  Substance and Sexual Activity   Alcohol  use: Yes    Comment: RARE Wine   Drug use: Not Currently    Types: Marijuana    Comment: only in my teens   Sexual activity: Yes    Birth control/protection: Surgical     Comment: Hysterectomy  Other Topics Concern   Not on file  Social History Narrative   Not on file   Social Drivers of Health   Financial Resource Strain: Medium Risk (09/10/2023)   Overall Financial Resource Strain (CARDIA)    Difficulty of Paying Living Expenses: Somewhat hard  Food Insecurity: No Food Insecurity (09/11/2023)   Hunger Vital Sign    Worried About Running Out of Food in the Last Year: Never true    Ran Out of Food in the Last Year: Never true  Transportation Needs: Unmet Transportation Needs (09/10/2023)   PRAPARE - Transportation    Lack of Transportation (Medical): Yes    Lack of Transportation (Non-Medical): Yes  Physical Activity: Unknown (09/10/2023)   Exercise Vital Sign    Days of Exercise per Week: 0 days    Minutes of Exercise per Session: Not on file  Stress: No Stress Concern Present (09/10/2023)   [employer] of Occupational Health - Occupational Stress Questionnaire    Feeling of Stress : Not at all  Social Connections: Socially Isolated (09/10/2023)   Social Connection and Isolation Panel [NHANES]    Frequency of Communication with Friends and Family: Once a week    Frequency of Social Gatherings with Friends and Family: Never    Attends Religious Services: Never  Active Member of Clubs or Organizations: No    Attends Banker Meetings: Not on file    Marital Status: Married   Past Surgical History:  Procedure Laterality Date   ABDOMINAL HYSTERECTOMY  06/2005   w/right ovariy   AMPUTATION Right 01/28/2020    RIGHT FOURTH AND FIFTH RAY AMPUTATION   AMPUTATION Right 01/28/2020   Procedure: RIGHT FOURTH AND FIFTH RAY AMPUTATION,;  Surgeon: Harden Jerona GAILS, MD;  Location: [facility] OR;  Service: Orthopedics;  Laterality: Right;   AMPUTATION Right 06/01/2021   Procedure: RIGHT BELOW KNEE AMPUTATION;  Surgeon: Harden Jerona GAILS, MD;  Location[facility] OR;  Service: Orthopedics;  Laterality: Right;   AMPUTATION Right 07/22/2021   Procedure:  RIGHT ABOVE KNEE AMPUTATION;  Surgeon: Harden Jerona GAILS, MD;  Location: [facility] OR;  Service: Orthopedics;  Laterality: Right;   APPENDECTOMY     BREAST BIOPSY Bilateral    9 total (04/18/2018)   CORONARY ANGIOPLASTY WITH STENT PLACEMENT  10/01/2015   normal LM, 30% LAD, 85% mid RCA and 95% distal RCA s/p PCI of the mid to distal RCA and now on DAPT with ASA and Ticagrelor .     CORONARY STENT INTERVENTION Right 04/18/2018   Procedure: CORONARY STENT INTERVENTION;  Surgeon: Verlin Lonni BIRCH, MD;  Location: [facility];  Service: Cardiovascular;  Laterality: Right;   DILATION AND CURETTAGE OF UTERUS     FRACTURE SURGERY     LAPAROSCOPIC CHOLECYSTECTOMY     LEFT HEART CATH AND CORONARY ANGIOGRAPHY N/A 04/18/2018   Procedure: LEFT HEART CATH AND CORONARY ANGIOGRAPHY;  Surgeon: Verlin Lonni BIRCH, MD;  Location: [facility];  Service: Cardiovascular;  Laterality: N/A;   MUSCLE BIOPSY Left    leg   OOPHORECTOMY  07/2010   RADIOLOGY WITH ANESTHESIA N/A 05/25/2016   Procedure: RADIOLOGY WITH ANESTHESIA;  Surgeon: Medication Radiologist, MD;  Location: [facility] OR;  Service: Radiology;  Laterality: N/A;   STUMP REVISION Right 06/29/2021   Procedure: REVISION RIGHT BELOW KNEE AMPUTATION;  Surgeon: Harden Jerona GAILS, MD;  Location: [facility] OR;  Service: Orthopedics;  Laterality: Right;   TEE WITHOUT CARDIOVERSION N/A 06/01/2021   Procedure: TRANSESOPHAGEAL ECHOCARDIOGRAM (TEE);  Surgeon: Barbaraann Darryle Ned, MD;  Location: [facility] OR;  Service: Cardiovascular;  Laterality: N/A;   TONSILLECTOMY AND ADENOIDECTOMY     WRIST FRACTURE SURGERY Left    crushed it   Past Surgical History:  Procedure Laterality Date   ABDOMINAL HYSTERECTOMY  06/2005   w/right ovariy   AMPUTATION Right 01/28/2020    RIGHT FOURTH AND FIFTH RAY AMPUTATION   AMPUTATION Right 01/28/2020   Procedure: RIGHT FOURTH AND FIFTH RAY AMPUTATION,;  Surgeon: Harden Jerona GAILS, MD;  Location: [facility] OR;  Service: Orthopedics;  Laterality:  Right;   AMPUTATION Right 06/01/2021   Procedure: RIGHT BELOW KNEE AMPUTATION;  Surgeon: Harden Jerona GAILS, MD;  Location: [facility] OR;  Service: Orthopedics;  Laterality: Right;   AMPUTATION Right 07/22/2021   Procedure: RIGHT ABOVE KNEE AMPUTATION;  Surgeon: Harden Jerona GAILS, MD;  Location: [facility] OR;  Service: Orthopedics;  Laterality: Right;   APPENDECTOMY     BREAST BIOPSY Bilateral    9 total (04/18/2018)   CORONARY ANGIOPLASTY WITH STENT PLACEMENT  10/01/2015   normal LM, 30% LAD, 85% mid RCA and 95% distal RCA s/p PCI of the mid to distal RCA and now on DAPT with ASA and Ticagrelor .     CORONARY STENT INTERVENTION Right 04/18/2018   Procedure: CORONARY STENT INTERVENTION;  Surgeon: Verlin,  Lonni BIRCH, MD;  [facility];  Service: Cardiovascular;  Laterality: Right;   DILATION AND CURETTAGE OF UTERUS     FRACTURE SURGERY     LAPAROSCOPIC CHOLECYSTECTOMY     LEFT HEART CATH AND CORONARY ANGIOGRAPHY N/A 04/18/2018   Procedure: LEFT HEART CATH AND CORONARY ANGIOGRAPHY;  Surgeon: Verlin Lonni BIRCH, MD;  [facility]: [facility];  Service: Cardiovascular;  Laterality: N/A;   MUSCLE BIOPSY Left    leg   OOPHORECTOMY  07/2010   RADIOLOGY WITH ANESTHESIA N/A 05/25/2016   Procedure: RADIOLOGY WITH ANESTHESIA;  Surgeon: Medication Radiologist, MD;  Location: [facility] OR;  Service: Radiology;  Laterality: N/A;   STUMP REVISION Right 06/29/2021   Procedure: REVISION RIGHT BELOW KNEE AMPUTATION;  Surgeon: Harden Jerona GAILS, MD;  Location: [facility] OR;  Service: Orthopedics;  Laterality: Right;   TEE WITHOUT CARDIOVERSION N/A 06/01/2021   Procedure: TRANSESOPHAGEAL ECHOCARDIOGRAM (TEE);  Surgeon: Barbaraann Darryle Ned, MD;  Location: [facility] OR;  Service: Cardiovascular;  Laterality: N/A;   TONSILLECTOMY AND ADENOIDECTOMY     WRIST FRACTURE SURGERY Left    crushed it   Past Medical History:  Diagnosis Date   Aortic stenosis    mild AS by echo 02/2021   Asthma    on daily RX and rescue inhaler  (04/18/2018)   Chronic diastolic CHF (congestive heart failure) (HCC) 09/2015   Chronic lower back pain    Chronic neck pain    Chronic pain syndrome    Fentanyl  Patch   Colon polyps    Coronary artery disease    cath with normal LM, 30% LAD, 85% mid RCA and 95% distal RCA s/p PCI of the mid to distal RCA and now on DAPT with ASA and Ticagrelor .     Eczema    Excessive daytime sleepiness 11/26/2015   Fibromyalgia    Gallstones    GERD (gastroesophageal reflux disease)    Heart murmur    noted for the 1st time on 04/18/2018   History of blood transfusion 07/2010   S/P oophorectomy   History of gout    History of hiatal hernia 1980s   gone now (04/18/2018)   Hyperlipidemia    Hypertension    takes Metoprolol  and Enalapril  daily   Hypothyroidism    takes Synthroid  daily   IBS (irritable bowel syndrome)    Migraine    nothing in the 2000s (04/18/2018)   Mixed connective tissue disease (HCC)    NAFLD (nonalcoholic fatty liver disease)    Pneumonia    several times (04/18/2018)   PVC's (premature ventricular contractions)    noted on event monitor 02/2021   Rheumatoid arthritis (HCC)    hands, elbows, shoulders, probably knees (04/18/2018)   Scoliosis    Sjogren's syndrome (HCC)    Sleep apnea    mild - does not use cpap   Spondylosis    Type II diabetes mellitus (HCC)    takes Metformin  and Hum R daily (04/18/2018)   Walker as ambulation aid    also uses wheelchair   There were no vitals taken for this visit.  Opioid Risk Score:   Fall Risk Score:  `1  Depression screen [facility] 2/9     07/10/2023   11:08 AM 06/13/2023   10:36 AM 05/11/2023    9:02 AM 04/12/2023    9:45 AM 01/30/2023   10:04 AM 01/01/2023    9:58 AM 11/28/2022    9:45 AM  Depression screen PHQ 2/9  Decreased Interest 0  1 3 0 1 1 1   Down, Depressed, Hopeless 0 1 1 0 1 1 1   PHQ - 2 Score 0 2 4 0 2 2 2       Review of Systems     Objective:   Physical Exam        Assessment & Plan:

## 2023-09-11 NOTE — Consult Note (Signed)
 WOC Nurse Consult Note: [facility] to multiple areas to include abdomen, L leg, L foot, labia, L breast; last seen there 06/25/2023 Also followed by Dr. 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  Full thickness x 3 separate areas L breast per patient from a burn from a heating pad ? Years ago  2.  Full thickness umbilicus (per patient chronic, said from bug bite in [facility] notes)  3.  Full thickness L plantar foot likely r/t diabetic ulcer  4.  Full thickness mons and R upper labia likely from moisture and friction  5. Full thickness under L pannus r/t moisture and friction  6. Intertriginous dermatitis underneath R breast and pannus  7. Moisture Associated Skin Damage to sacrum/buttocks/perineum  ICD-10 CM Codes for Irritant Dermatitis L24A0 - Due to friction or contact with body fluids; unspecified  L30.4  - Erythema intertrigo. Also used for abrasion of the hand, chafing of the skin, dermatitis due to sweating and friction, friction dermatitis, friction eczema, and genital/thigh intertrigo.  Pressure Injury POA: NA  Measurement: 1.  L breast 3 separate areas approximately 1 cm x 2 cm to medial, anterior and lateral surface, all 100% scabbed, most medial scab noted to have some small tan exudate on it  2.  Full thickness umbilicus 3 cm x 2 cm x 0.6 cm 100% covered in tan fibrinous material  3.  Full thickness plantar L foot 13 cm x 5 cm x 0.4 cm 90% pink clean 10% scattered tan fibrin moderate amount of tan exudate  4.  Full thickness mons 7 cm x 9 cm x 0.1 cm and 2 cm x 2 cm x 0.1 cm 100% pink moist painful scant tn exudate; R upper labia approximately 2 cm x 2 cm x 0.1 cm 100% pink moist  5.   Full thickness L pannus 3 separate areas 2 cm x 2 cm x 0.2 cm most medial 100% pink moist, superior 1 cm x 4 cm 100% tan fibrinous tissue distal 3 cm x 5 cm x 0.1 cm 100% pink moist  6.  Erythema and partial thickness skin loss underneath R breast and pannus L worse than right   Wound bed: as above  Drainage (amount, consistency, odor) moderate tan exudate from most wounds other than L breast mostly dry  Periwound: L leg with lymphatic papillomatosis, erythematous and weeping clear fluid  Dressing procedure/placement/frequency:   Cleanse L breast wounds with Vashe wound cleanser, apply Bactroban  ointment 2 times a day, may cover with silicone foam or leave open to air whichever patient prefers.  Clean L foot ulcer and L leg with Vashe wound cleanser Soila [phone]), apply silver  hydrofiber to wound bed of L plantar foot and cover with dry gauze.  Cover weeping areas to L leg (anterior and posterior) with silver  hydrofiber Soila [phone]) - will take approximately 4-6 sheets- cover with ABD pads and wrap with Kerlix roll gauze beginning right above toes and ending right below knee.  Secure with Ace bandage wrapped in same fashion.  Clean underneath ulcers under L pannus and mons with Vashe wound cleanser, apply silver  hydrofiber Soila [phone]) cut to fit wound beds daily, may attempt to cover  mons silver  with silicone foam to secure.  Cleanse underneath R breast and entire pannus with Vashe wound cleanser. Apply Minna Soila # [phone]) Measure and cut length of InterDry to fit in skin folds that have skin breakdown Tuck InterDry fabric into skin folds in a single layer, allow for 2 inches of overhang from skin edges to allow for wicking to occur May remove to bathe; dry area thoroughly and then tuck into affected areas again Do not apply any creams or ointments when using InterDry DO NOT THROW AWAY FOR 5 DAYS unless soiled with stool DO NOT [name] product, this will inactivate the silver  in the  material  New sheet of Interdry should be applied after 5 days of use if patient continues to have skin breakdown    Will be very difficult to get any wound care to stick to labia.  Would clean labia, perineum, buttocks and sacrum with Vashe then apply a thin layer of Gerhardt's Butt Cream 2 times a day and prn soiling.   POC discussed with patient and bedside nurse. Appreciate bedside nurse and NT for assisting with this evaluation.    Patient should continue to follow at [facility] for these chronic wounds after discharge.  Patient says she and husband have decided they can do better by taking care of the wounds themselves at home but are using prescription medications (Gentamicin and Lidocaine )provided by wound care center.    WOC team will not follow.  Re-consult if further needs arise.   Thank you,    Powell Bar MSN, RN-BC, [employer] [phone]

## 2023-09-11 NOTE — Evaluation (Signed)
 Physical Therapy Evaluation Patient Details Name: [name] MRN: [id number] DOB: Jun 10, 1961 Today's Date: 09/11/2023  History of Present Illness  [name] is a 62 y.o.presented 09/10/23 from PCP for worsening Right breast cellulitis, LLE cellulitis. PMH: morbid obesity, right AKA, irritable bowel syndrome, hypertension, hyperlipidemia, hypothyroidism, type 2 diabetes mellitus, rheumatoid arthritis, asthma  Clinical Impression  Pt admitted with above diagnosis.  Pt currently with functional limitations due to the deficits listed below (see PT Problem List). Pt will benefit from acute skilled PT to increase their independence and safety with mobility to allow discharge.     The patient received, propped sitting to the right   with HOB raised, pillows  to support.  Patient reports that she was having difficulty breathing when lying on her back. Assisted patient to sit up right andf place more right side pillows. Patient with noted Dyspnea when speaking.  Patient on 3 LPM, SPO2 95%. Patient reports able to transfer  to/from [facility] and able to hop a few feet using a Rw.  Patient currently with significant limitations for mobility      If plan is discharge home, recommend the following: Two people to help with walking and/or transfers;Two people to help with bathing/dressing/bathroom   Can travel by private vehicle   No    Equipment Recommendations None recommended by PT  Recommendations for Other Services       Functional Status Assessment Patient has had a recent decline in their functional status and demonstrates the ability to make significant improvements in function in a reasonable and predictable amount of time.     Precautions / Restrictions Precautions Precautions: Fall Precaution Comments: RAKA Restrictions Weight Bearing Restrictions Per Provider Order: No      Mobility  Bed Mobility               General bed mobility comments: patient is seated with left  leg hanging over edge, patient is propped to her right side on multiple pillows, mmax support to lean to left to place more  pillow on right, pt. reports difficulty  breathing  so sat upright    Transfers                   General transfer comment: NT    Ambulation/Gait                  Stairs            Wheelchair Mobility     Tilt Bed    Modified Rankin (Stroke Patients Only)       Balance Overall balance assessment: Needs assistance, History of Falls Sitting-balance support: Single extremity supported, Feet unsupported Sitting balance-[name]: Poor Sitting balance - Comments: unable to shift to left with significant leg and  body edema Postural control: Right lateral lean                                   Pertinent Vitals/Pain Pain Assessment Pain Assessment: Faces Faces Pain Scale: Hurts even more Pain Location: right shoulder Pain Descriptors / Indicators: Cramping, Discomfort Pain Intervention(s): Premedicated before session, Repositioned    Home Living Family/patient expects to be discharged to:: Private residence Living Arrangements: Spouse/significant other Available Help at Discharge: Available 24 hours/day;Family Type of Home: House Home Access: Ramped entrance       Home Layout: Two level;Able to live on main level with bedroom/bathroom Home Equipment: Wheelchair -  power;Wheelchair - Forensic Psychologist (2 wheels);Rollator (4 wheels);BSC/3in1      Prior Function Prior Level of Function : Needs assist             Mobility Comments: states  has been able to hop 10 steps until recently, transfers to WC/toilet ADLs Comments: transfers to tub bencgh     Extremity/Trunk Assessment   Upper Extremity Assessment Upper Extremity Assessment: RUE deficits/detail RUE Deficits / Details: reports  pulled soomething in shoulder recently . patient seated on bed edge to right, leaning on HOB raised at max, propped  on several pillows    Lower Extremity Assessment Lower Extremity Assessment: LLE deficits/detail LLE Deficits / Details: noted skin tightness, edema in thigh decreased knee flexion       Communication   Communication Communication: No apparent difficulties  Cognition Arousal: Alert Behavior During Therapy: WFL for tasks assessed/performed Overall Cognitive Status: Within Functional Limits for tasks assessed                                          General Comments      Exercises     Assessment/Plan    PT Assessment Patient needs continued PT services  PT Problem List Decreased strength;Decreased balance;Decreased knowledge of precautions;Decreased range of motion;Decreased mobility;Decreased activity tolerance;Decreased skin integrity;Obesity       PT Treatment Interventions DME instruction;Therapeutic activities;Patient/family education;Functional mobility training;Balance training    PT Goals (Current goals can be found in the Care Plan section)  Acute Rehab PT Goals Patient Stated Goal: to regain strength, be able to transfer PT Goal Formulation: With patient/family Time For Goal Achievement: 09/25/23 Potential to Achieve Goals: Fair    Frequency Min 1X/week     Co-evaluation               AM-PAC PT 6 Clicks Mobility  Outcome Measure Help needed turning from your back to your side while in a flat bed without using bedrails?: Total Help needed moving from lying on your back to sitting on the side of a flat bed without using bedrails?: Total Help needed moving to and from a bed to a chair (including a wheelchair)?: Total Help needed standing up from a chair using your arms (e.g., wheelchair or bedside chair)?: Total Help needed to walk in hospital room?: Total Help needed climbing 3-5 steps with a railing? : Total 6 Click Score: 6    End of Session   Activity Tolerance: Treatment limited secondary to medical complications (Comment)  (SOB) Patient left: in bed;with family/visitor present;with call bell/phone within reach Nurse Communication: Mobility status;Need for lift equipment PT Visit Diagnosis: Unsteadiness on feet (R26.81);Muscle weakness (generalized) (M62.81);Difficulty in walking, not elsewhere classified (R26.2);Pain Pain - Right/Left: Right Pain - part of body: Shoulder    Time: 1600-1630 PT Time Calculation (min) (ACUTE ONLY): 30 min   Charges:   PT Evaluation $PT Eval Low Complexity: 1 Low PT Treatments $Therapeutic Activity: 8-22 mins PT General Charges $$ ACUTE PT VISIT: 1 Visit         Darice Potters PT Acute Rehabilitation Services Office [phone] Weekend [phone]   Potters Darice Norris 09/11/2023, 4:43 PM

## 2023-09-11 NOTE — Progress Notes (Addendum)
 PROGRESS NOTE    [name]  FMW:[id number] DOB: 04-25-1961 DOA: 09/10/2023 PCP: [name], DO     Brief Narrative:  [name] is a 62 y.o. female with medical history significant of morbid obesity, right AKA, irritable bowel syndrome, hypertension, hyperlipidemia, hypothyroidism, type 2 diabetes mellitus, rheumatoid arthritis, asthma who presented to ED at her PCPs recommendation with worsening cellulitis of right breast.  Reportedly, patient initially presented to ED on 09/03/2023 and was diagnosed with left lower extremity as well as cellulitis under right breast and was discharged home on cefadroxil .  Patient continued to take medications as prescribed however her cellulitis continued to get worse.  She was seen by her PCP via virtual visit and was advised to come to the emergency department.  She was started on vancomycin .  New events last 24 hours / Subjective: She states that she follows with outpatient wound care center for her chronic wounds to multiple areas including abdomen, left lower leg, left foot, labia, left breast.  Previously seen in October 2024.  Has some serous fluid from under her right breast as well as significant swelling.  Addendum: This afternoon, patient complained of SOB. She take lasix  at home PRN. Has gotten IVF due to sepsis. This was discontinued. Will give IV lasix , get CXR and echo  Assessment & Plan:   Principal Problem:   Cellulitis Active Problems:   Type 2 diabetes mellitus with hyperglycemia, with long-term current use of insulin  (HCC)   CAD S/P percutaneous coronary angioplasty   Iron  deficiency anemia   Mixed connective tissue disease (HCC)   Gastroesophageal reflux disease   Morbid obesity (HCC)   Sepsis (HCC)   Sepsis secondary to right breast cellulitis -Sepsis present on admission with tachycardia, tachypnea -Blood cultures pending -Nonpurulent in nature -Vancomycin  --> Rocephin  -Nystatin  powder for fungal  infection  Multiple chronic wounds including abdomen, left breast, left lower extremity, labia -Appreciate wound RN recommendations  CAD -Aspirin , Imdur   Diabetes mellitus -Continue Humulin  R, sliding scale insulin   Iron  deficiency anemia -Ferrous sulfate   Hypothyroidism Euthyroid sick syndrome -Synthroid  -Repeat TSH as outpatient in 4 to 6 weeks  Chronic pain -Oxycodone , gabapentin    DVT prophylaxis: Lovenox    Code Status: Full code Family Communication: None Disposition Plan: Home Status is: Inpatient Remains inpatient appropriate because: IV antibiotics    Antimicrobials:  Anti-infectives (From admission, onward)    Start     Dose/Rate Route Frequency Ordered Stop   09/11/23 1800  vancomycin  (VANCOCIN ) 1,750 mg in sodium chloride  0.9 % 500 mL IVPB  Status:  Discontinued        1,750 mg 258.8 mL/hr over 120 Minutes Intravenous Every 24 hours 09/10/23 2052 09/11/23 1309   09/11/23 1400  cefTRIAXone  (ROCEPHIN ) 2 g in sodium chloride  0.9 % 100 mL IVPB        2 g 200 mL/hr over 30 Minutes Intravenous Every 24 hours 09/11/23 1309 09/18/23 1359   09/11/23 0015  fluconazole  (DIFLUCAN ) tablet 100 mg  Status:  Discontinued        100 mg Oral Daily 09/10/23 2005 09/11/23 0948   09/10/23 2000  vancomycin  (VANCOCIN ) IVPB 1000 mg/200 mL premix  Status:  Discontinued        1,000 mg 200 mL/hr over 60 Minutes Intravenous  Once 09/10/23 1958 09/11/23 0005   09/10/23 1415  cefTRIAXone  (ROCEPHIN ) 2 g in sodium chloride  0.9 % 100 mL IVPB        2 g 200 mL/hr over 30 Minutes Intravenous  Once 09/10/23 1406 09/10/23 1518   09/10/23 1415  vancomycin  (VANCOCIN ) IVPB 1000 mg/200 mL premix        1,000 mg 200 mL/hr over 60 Minutes Intravenous Every 1 hr x 2 09/10/23 1408 09/10/23 1923        Objective: Vitals:   09/10/23 2036 09/11/23 0210 09/11/23 0608 09/11/23 0956  BP: (!) 106/55 119/60 (!) 143/75 129/66  Pulse: (!) 101 (!) 103 (!) 101 99  Resp: 20 15 18 18   Temp: 98 F  (36.7 C) 98.7 F (37.1 C) 98.2 F (36.8 C) 98 F (36.7 C)  TempSrc: Oral Oral  Oral  SpO2: 93% 96% 97% 91%  Weight:      Height:        Intake/Output Summary (Last 24 hours) at 09/11/2023 1313 Last data filed at 09/11/2023 1000 Gross per 24 hour  Intake 1937.28 ml  Output --  Net 1937.28 ml   Filed Weights   09/10/23 1321  Weight: 132.5 kg    Examination:  General exam: Appears calm and comfortable  Respiratory system: Clear to auscultation. Respiratory effort normal. No respiratory distress. No conversational dyspnea.  Cardiovascular system: S1 & S2 heard, RRR. No murmurs. No pedal edema. Gastrointestinal system: Abdomen is nondistended, soft  Central nervous system: Alert and oriented. No focal neurological deficits. Speech clear.  Extremities: Right AKA Skin: + Right breast with swelling, erythema and induration Psychiatry: Judgement and insight appear normal. Mood & affect appropriate.   Data Reviewed: I have personally reviewed following labs and imaging studies  CBC: Recent Labs  Lab 09/10/23 1434 09/11/23 0417  WBC 10.1 9.5  NEUTROABS 7.0  --   HGB 8.2* 7.9*  HCT 32.1* 31.5*  MCV 67.6* 70.2*  PLT 324 280   Basic Metabolic Panel: Recent Labs  Lab 09/10/23 1434 09/10/23 2047 09/11/23 0417  NA 140  --  129*  K 4.6  --  4.3  CL 104  --  97*  CO2 22  --  23  GLUCOSE 180*  --  241*  BUN 13  --  10  CREATININE 0.80  --  0.70  CALCIUM  9.1  --  8.1*  MG  --  2.0  --    GFR: Estimated Creatinine Clearance: 96.5 mL/min (by C-G formula based on SCr of 0.7 mg/dL). Liver Function Tests: Recent Labs  Lab 09/10/23 1434 09/11/23 0417  AST 32 27  ALT 23 22  ALKPHOS 88 80  BILITOT 0.6 0.6  PROT 7.2 6.5  ALBUMIN 3.2* 2.8*   No results for input(s): LIPASE, AMYLASE in the last 168 hours. No results for input(s): AMMONIA in the last 168 hours. Coagulation Profile: Recent Labs  Lab 09/11/23 0417  INR 1.2   Cardiac Enzymes: Recent Labs  Lab  09/10/23 1434  CKTOTAL 121   BNP (last 3 results) No results for input(s): PROBNP in the last 8760 hours. HbA1C: No results for input(s): HGBA1C in the last 72 hours. CBG: Recent Labs  Lab 09/10/23 2049 09/11/23 0800 09/11/23 1141  GLUCAP 155* 195* 273*   Lipid Profile: No results for input(s): CHOL, HDL, LDLCALC, TRIG, CHOLHDL, LDLDIRECT in the last 72 hours. Thyroid  Function Tests: Recent Labs    09/10/23 2047 09/11/23 0943  TSH 5.692*  --   FREET4  --  1.47*   Anemia Panel: No results for input(s): VITAMINB12, FOLATE, FERRITIN, TIBC, IRON , RETICCTPCT in the last 72 hours. Sepsis Labs: No results for input(s): PROCALCITON, LATICACIDVEN in the last 168 hours.  Recent  Results (from the past 240 hours)  Blood culture (routine x 2)     Status: None (Preliminary result)   Collection Time: 09/10/23  2:32 PM   Specimen: BLOOD  Result Value Ref Range Status   Specimen Description   Final    BLOOD RIGHT ANTECUBITAL Performed at [facility], [address], [place], [place] [address]    Special Requests   Final    BOTTLES DRAWN AEROBIC AND ANAEROBIC Blood Culture results may not be optimal due to an inadequate volume of blood received in culture bottles Performed at [facility], [address] [place], [place] [address]    Culture   Final    NO GROWTH < 24 HOURS Performed at [facility], [address] [place], [place] [address]    Report Status PENDING  Incomplete  Blood culture (routine x 2)     Status: None (Preliminary result)   Collection Time: 09/10/23  2:34 PM   Specimen: BLOOD  Result Value Ref Range Status   Specimen Description   Final    BLOOD LEFT ANTECUBITAL Performed at [facility], [address] [place], [place] [address]    Special Requests   Final    BOTTLES DRAWN AEROBIC AND ANAEROBIC Blood Culture results may not be optimal due to an inadequate  volume of blood received in culture bottles Performed at [facility], [address], [place], [place] [address]    Culture   Final    NO GROWTH < 24 HOURS Performed at [facility], [address] [place], [place] [address]    Report Status PENDING  Incomplete  Resp panel by RT-PCR (RSV, Flu A&B, Covid) Anterior Nasal Swab     Status: None   Collection Time: 09/10/23  2:59 PM   Specimen: Anterior Nasal Swab  Result Value Ref Range Status   SARS Coronavirus 2 by RT PCR NEGATIVE NEGATIVE Final    Comment: (NOTE) SARS-CoV-2 target nucleic acids are NOT DETECTED.  The SARS-CoV-2 RNA is generally detectable in upper respiratory specimens during the acute phase of infection. The lowest concentration of SARS-CoV-2 viral copies this assay can detect is 138 copies/mL. A negative result does not preclude SARS-Cov-2 infection and should not be used as the sole basis for treatment or other patient management decisions. A negative result may occur with  improper specimen collection/handling, submission of specimen other than nasopharyngeal swab, presence of viral mutation(s) within the areas targeted by this assay, and inadequate number of viral copies(<138 copies/mL). A negative result must be combined with clinical observations, patient history, and epidemiological information. The expected result is Negative.  Fact Sheet for Patients:  bloggercourse.com  Fact Sheet for Healthcare Providers:  seriousbroker.it  This test is no t yet approved or cleared by the [place]  FDA and  has been authorized for detection and/or diagnosis of SARS-CoV-2 by FDA under an Emergency Use Authorization (EUA). This EUA will remain  in effect (meaning this test can be used) for the duration of the COVID-19 declaration under Section 564(b)(1) of the Act, 21 U.S.C.section 360bbb-3(b)(1), unless the authorization is terminated  or  revoked sooner.       Influenza A by PCR NEGATIVE NEGATIVE Final   Influenza B by PCR NEGATIVE NEGATIVE Final    Comment: (NOTE) The Xpert Xpress SARS-CoV-2/FLU/RSV plus assay is intended as an aid in the diagnosis of influenza from Nasopharyngeal swab specimens and should not be used as  a sole basis for treatment. Nasal washings and aspirates are unacceptable for Xpert Xpress SARS-CoV-2/FLU/RSV testing.  Fact Sheet for Patients: bloggercourse.com  Fact Sheet for Healthcare Providers: seriousbroker.it  This test is not yet approved or cleared by the [place]  FDA and has been authorized for detection and/or diagnosis of SARS-CoV-2 by FDA under an Emergency Use Authorization (EUA). This EUA will remain in effect (meaning this test can be used) for the duration of the COVID-19 declaration under Section 564(b)(1) of the Act, 21 U.S.C. section 360bbb-3(b)(1), unless the authorization is terminated or revoked.     Resp Syncytial Virus by PCR NEGATIVE NEGATIVE Final    Comment: (NOTE) Fact Sheet for Patients: bloggercourse.com  Fact Sheet for Healthcare Providers: seriousbroker.it  This test is not yet approved or cleared by the [place]  FDA and has been authorized for detection and/or diagnosis of SARS-CoV-2 by FDA under an Emergency Use Authorization (EUA). This EUA will remain in effect (meaning this test can be used) for the duration of the COVID-19 declaration under Section 564(b)(1) of the Act, 21 U.S.C. section 360bbb-3(b)(1), unless the authorization is terminated or revoked.  Performed at [facility], [address], [place], [place] [address]       Radiology Studies: CT CHEST WO CONTRAST Result Date: 09/10/2023 CLINICAL DATA:  History of recent falls with chest and abdominal pain, initial encounter EXAM: CT CHEST, ABDOMEN AND PELVIS  WITHOUT CONTRAST TECHNIQUE: Multidetector CT imaging of the chest, abdomen and pelvis was performed following the standard protocol without IV contrast. RADIATION DOSE REDUCTION: This exam was performed according to the departmental dose-optimization program which includes automated exposure control, adjustment of the mA and/or kV according to patient size and/or use of iterative reconstruction technique. COMPARISON:  Chest x-ray from earlier in the same day. FINDINGS: CT CHEST FINDINGS Cardiovascular: Somewhat limited due to lack of IV contrast. Atherosclerotic calcifications of the thoracic aorta are noted. Mild cardiac enlargement is seen. Heavy coronary calcifications are noted. Mediastinum/Nodes: Thoracic inlet is within normal limits. No hilar or mediastinal adenopathy is noted. The esophagus as visualized is within normal limits. Lungs/Pleura: Lungs are well aerated bilaterally. Azygous lobe is seen. Small right-sided pleural effusion is noted with patchy atelectatic changes in the right base. No focal confluent infiltrate is seen. Musculoskeletal: Degenerative changes of the thoracic spine are noted. No acute rib abnormality is seen. Soft tissues edema is noted throughout the right breast without definitive abscess. CT ABDOMEN PELVIS FINDINGS Hepatobiliary: No focal liver abnormality is seen. Status post cholecystectomy. No biliary dilatation. Pancreas: Unremarkable. No pancreatic ductal dilatation or surrounding inflammatory changes. Spleen: Normal in size without focal abnormality. Adrenals/Urinary Tract: Adrenal glands are within normal limits. Kidneys are well visualized bilaterally. No renal calculi or obstructive changes are noted. The bladder is well distended. Stomach/Bowel: No obstructive or inflammatory changes of the colon are seen. The appendix is within normal limits. Small bowel and stomach are unremarkable. Vascular/Lymphatic: Aortic atherosclerosis. No enlarged abdominal or pelvic lymph  nodes. Reproductive: Status post hysterectomy. No adnexal masses. Other: No abdominal wall hernia or abnormality. No abdominopelvic ascites. Musculoskeletal: No acute bony abnormality is noted in the abdomen and pelvis. Degenerative changes of lumbar spine are seen. Similar subcutaneous edema and skin thickening is noted in the abdominal wall and pelvis which may represent some generalized cellulitis. Correlation with the physical exam is recommended. IMPRESSION: CT of the chest: Patchy atelectatic changes in the right base. Small effusion is noted on the right as well. Considerable subcutaneous edema  within the right breast consistent with cellulitis. CT of the abdomen and pelvis: Considerable subcutaneous edema in the abdominal wall and gluteal regions similar to that seen in the right breast again suspicious for cellulitis. Diffuse skin thickening is noted. Electronically Signed   By: Oneil Devonshire M.D.   On: 09/10/2023 18:59   CT ABDOMEN PELVIS WO CONTRAST Result Date: 09/10/2023 CLINICAL DATA:  History of recent falls with chest and abdominal pain, initial encounter EXAM: CT CHEST, ABDOMEN AND PELVIS WITHOUT CONTRAST TECHNIQUE: Multidetector CT imaging of the chest, abdomen and pelvis was performed following the standard protocol without IV contrast. RADIATION DOSE REDUCTION: This exam was performed according to the departmental dose-optimization program which includes automated exposure control, adjustment of the mA and/or kV according to patient size and/or use of iterative reconstruction technique. COMPARISON:  Chest x-ray from earlier in the same day. FINDINGS: CT CHEST FINDINGS Cardiovascular: Somewhat limited due to lack of IV contrast. Atherosclerotic calcifications of the thoracic aorta are noted. Mild cardiac enlargement is seen. Heavy coronary calcifications are noted. Mediastinum/Nodes: Thoracic inlet is within normal limits. No hilar or mediastinal adenopathy is noted. The esophagus as visualized  is within normal limits. Lungs/Pleura: Lungs are well aerated bilaterally. Azygous lobe is seen. Small right-sided pleural effusion is noted with patchy atelectatic changes in the right base. No focal confluent infiltrate is seen. Musculoskeletal: Degenerative changes of the thoracic spine are noted. No acute rib abnormality is seen. Soft tissues edema is noted throughout the right breast without definitive abscess. CT ABDOMEN PELVIS FINDINGS Hepatobiliary: No focal liver abnormality is seen. Status post cholecystectomy. No biliary dilatation. Pancreas: Unremarkable. No pancreatic ductal dilatation or surrounding inflammatory changes. Spleen: Normal in size without focal abnormality. Adrenals/Urinary Tract: Adrenal glands are within normal limits. Kidneys are well visualized bilaterally. No renal calculi or obstructive changes are noted. The bladder is well distended. Stomach/Bowel: No obstructive or inflammatory changes of the colon are seen. The appendix is within normal limits. Small bowel and stomach are unremarkable. Vascular/Lymphatic: Aortic atherosclerosis. No enlarged abdominal or pelvic lymph nodes. Reproductive: Status post hysterectomy. No adnexal masses. Other: No abdominal wall hernia or abnormality. No abdominopelvic ascites. Musculoskeletal: No acute bony abnormality is noted in the abdomen and pelvis. Degenerative changes of lumbar spine are seen. Similar subcutaneous edema and skin thickening is noted in the abdominal wall and pelvis which may represent some generalized cellulitis. Correlation with the physical exam is recommended. IMPRESSION: CT of the chest: Patchy atelectatic changes in the right base. Small effusion is noted on the right as well. Considerable subcutaneous edema within the right breast consistent with cellulitis. CT of the abdomen and pelvis: Considerable subcutaneous edema in the abdominal wall and gluteal regions similar to that seen in the right breast again suspicious for  cellulitis. Diffuse skin thickening is noted. Electronically Signed   By: Oneil Devonshire M.D.   On: 09/10/2023 18:59   CT L-SPINE NO CHARGE Result Date: 09/10/2023 CLINICAL DATA:  Fall.  Leg pain. EXAM: CT LUMBAR SPINE WITHOUT CONTRAST TECHNIQUE: Multidetector CT imaging of the lumbar spine was performed without intravenous contrast administration. Multiplanar CT image reconstructions were also generated. RADIATION DOSE REDUCTION: This exam was performed according to the departmental dose-optimization program which includes automated exposure control, adjustment of the mA and/or kV according to patient size and/or use of iterative reconstruction technique. COMPARISON:  MRI lumbar spine 05/25/2016. FINDINGS: Segmentation: Conventional numbering is assumed with 5 non-rib-bearing, lumbar type vertebral bodies. Alignment: Unchanged grade 1 anterolisthesis of L4 on L5.  Levoscoliotic curvature of the lumbar spine. Vertebrae: Normal vertebral body heights. No acute fracture or suspicious bone lesion. Multilevel degenerative endplate changes. Paraspinal and other soft tissues: Atherosclerotic calcifications of the abdominal aorta and its branches. Disc levels: T12-L1: Right-greater-than-left facet arthropathy. No spinal canal stenosis or neural foraminal narrowing. L1-L2: Disc bulge and facet arthropathy results in mild spinal canal stenosis and moderate right neural foraminal narrowing. L2-L3: Left eccentric disc bulge and right eccentric endplate osteophytes results in mild spinal canal stenosis and moderate right neural foraminal narrowing. L3-L4: Disc bulge and right-greater-than-left facet arthropathy results in mild spinal canal stenosis, severe right and moderate left neural foraminal narrowing. L4-L5: Acquired fusion. No spinal canal stenosis or neural foraminal narrowing. L5-S1: No disc herniation. Severe left-greater-than-right facet arthropathy results in severe left and moderate right neural foraminal  narrowing. IMPRESSION: 1. No acute fracture or traumatic listhesis of the lumbar spine. 2. Multilevel degenerative changes of the lumbar spine, as described above. Aortic Atherosclerosis (ICD10-I70.0). Electronically Signed   By: Ryan Chess M.D.   On: 09/10/2023 17:08   DG Shoulder Right Portable Result Date: 09/10/2023 CLINICAL DATA:  Fall EXAM: RIGHT SHOULDER - 1 VIEW COMPARISON:  None available FINDINGS: No fracture or dislocation. Irregularity of the greater tuberosity indicative of rotator cuff tendinopathy. Mild right acromioclavicular osteoarthrosis. Advanced degenerative changes of the visualized cervical spine. Examination limited due to under penetration. IMPRESSION: No acute abnormality of the right shoulder. Electronically Signed   By: Aliene Lloyd M.D.   On: 09/10/2023 15:46   DG Chest Portable 1 View Result Date: 09/10/2023 CLINICAL DATA:  Fall EXAM: PORTABLE CHEST - 1 VIEW COMPARISON:  09/02/2023 FINDINGS: Unchanged cardiomegaly.  No pulmonary vascular congestion. Minimal right basilar atelectasis, similar to prior examination. Lungs are otherwise clear. Overall examination limited due to underpenetration. IMPRESSION: Limited examination.  Mild right basilar atelectasis. Electronically Signed   By: Aliene Lloyd M.D.   On: 09/10/2023 15:45      Scheduled Meds:  aspirin  EC  81 mg Oral Daily   enoxaparin  (LOVENOX ) injection  60 mg Subcutaneous Q24H   ferrous sulfate   325 mg Oral BID WC   fluticasone   1 spray Each Nare Daily   gabapentin   400 mg Oral BID   gabapentin   800 mg Oral QHS   Gerhardt's butt cream   Topical BID   insulin  aspart  0-20 Units Subcutaneous TID WC   insulin  aspart  0-5 Units Subcutaneous QHS   insulin  regular human CONCENTRATED  50 Units Subcutaneous TID WC   isosorbide  mononitrate  60 mg Oral Daily   levothyroxine   200 mcg Oral QAC breakfast   mupirocin  ointment   Topical BID   nystatin    Topical TID   pantoprazole   40 mg Oral Daily   pramipexole   1  mg Oral TID   pregabalin   50 mg Oral TID   sodium chloride  flush  3 mL Intravenous Q12H   Continuous Infusions:  cefTRIAXone  (ROCEPHIN )  IV       LOS: 1 day   Time spent: 35 minutes   Delon Hoe, DO [facility] 09/11/2023, 1:13 PM   Available via Epic secure chat 7am-7pm After these hours, please refer to coverage provider listed on [email or web address]

## 2023-09-12 ENCOUNTER — Inpatient Hospital Stay (HOSPITAL_COMMUNITY): Payer: Commercial Managed Care - HMO

## 2023-09-12 DIAGNOSIS — L03313 Cellulitis of chest wall: Secondary | ICD-10-CM | POA: Diagnosis not present

## 2023-09-12 DIAGNOSIS — I5031 Acute diastolic (congestive) heart failure: Secondary | ICD-10-CM

## 2023-09-12 LAB — ECHOCARDIOGRAM COMPLETE
AV Mean grad: 8 mm[Hg]
AV Peak grad: 16.3 mm[Hg]
Ao pk vel: 2.02 m/s
Area-P 1/2: 5.88 cm2
Height: 62.5 in
Weight: 4671.99 [oz_av]

## 2023-09-12 LAB — GLUCOSE, CAPILLARY
Glucose-Capillary: 202 mg/dL — ABNORMAL HIGH (ref 70–99)
Glucose-Capillary: 212 mg/dL — ABNORMAL HIGH (ref 70–99)
Glucose-Capillary: 267 mg/dL — ABNORMAL HIGH (ref 70–99)
Glucose-Capillary: 207 mg/dL — ABNORMAL HIGH (ref 70–99)

## 2023-09-12 LAB — CBC
HCT: 30.9 % — ABNORMAL LOW (ref 36.0–46.0)
Hemoglobin: 7.9 g/dL — ABNORMAL LOW (ref 12.0–15.0)
MCH: 17.4 pg — ABNORMAL LOW (ref 26.0–34.0)
MCHC: 25.6 g/dL — ABNORMAL LOW (ref 30.0–36.0)
MCV: 67.9 fL — ABNORMAL LOW (ref 80.0–100.0)
Platelets: 297 10*3/uL (ref 150–400)
RBC: 4.55 MIL/uL (ref 3.87–5.11)
RDW: 23.5 % — ABNORMAL HIGH (ref 11.5–15.5)
WBC: 8.7 10*3/uL (ref 4.0–10.5)
nRBC: 0.2 % (ref 0.0–0.2)

## 2023-09-12 LAB — BASIC METABOLIC PANEL
Anion gap: 8 (ref 5–15)
BUN: 10 mg/dL (ref 8–23)
CO2: 30 mmol/L (ref 22–32)
Calcium: 8.4 mg/dL — ABNORMAL LOW (ref 8.9–10.3)
Chloride: 98 mmol/L (ref 98–111)
Creatinine, Ser: 0.85 mg/dL (ref 0.44–1.00)
GFR, Estimated: >60 mL/min (ref >60)
Glucose, Bld: 218 mg/dL — ABNORMAL HIGH (ref 70–99)
Potassium: 4 mmol/L (ref 3.5–5.1)
Sodium: 136 mmol/L (ref 135–145)

## 2023-09-12 LAB — BRAIN NATRIURETIC PEPTIDE: B Natriuretic Peptide: 106.8 pg/mL — ABNORMAL HIGH (ref 0.0–100.0)

## 2023-09-12 MED ORDER — FUROSEMIDE 10 MG/ML IJ SOLN
40.0000 mg | Freq: Two times a day (BID) | INTRAMUSCULAR | Status: DC
  Administered 2023-09-12 – 2023-09-17 (×10): 40 mg via INTRAVENOUS
  Filled 2023-09-12 (×10): qty 4

## 2023-09-12 MED ORDER — PERFLUTREN LIPID MICROSPHERE
1.0000 mL | INTRAVENOUS | Status: AC | PRN
  Administered 2023-09-12: 3 mL via INTRAVENOUS

## 2023-09-12 MED ORDER — FUROSEMIDE 10 MG/ML IJ SOLN
40.0000 mg | Freq: Every day | INTRAMUSCULAR | Status: DC
  Administered 2023-09-12: 40 mg via INTRAVENOUS
  Filled 2023-09-12: qty 4

## 2023-09-12 NOTE — Plan of Care (Signed)
  Problem: Coping: Goal: Level of anxiety will decrease Outcome: Progressing   Problem: Pain Management: Goal: General experience of comfort will improve Outcome: Progressing

## 2023-09-12 NOTE — Progress Notes (Signed)
 PROGRESS NOTE    [name]  FMW:[id number] DOB: 1960-11-23 DOA: 09/10/2023 PCP: [name], DO     Brief Narrative:  [name] is a 63 y.o. female with medical history significant of morbid obesity, right AKA, irritable bowel syndrome, HTN, HLD, DM, RA, asthma who presented to ED at her PCPs recommendation with worsening cellulitis of right breast.  Reportedly, patient initially presented to ED on 09/03/2023 and was diagnosed with left lower extremity as well as cellulitis under right breast and was discharged home on cefadroxil .  Patient continued to take medications as prescribed however her cellulitis continued to get worse.  She was seen by her PCP via virtual visit and was advised to come to the emergency department.  She was started on vancomycin .  New events last 24 hours / Subjective: Pt with over 1L of urine output after lasix  was given on 09/11/23 as per pt. Still SOB, but denies its worsening. Denies any chest pain. Appears overloaded.  Switched over telemetry bed    Assessment & Plan:   Principal Problem:   Cellulitis Active Problems:   Chronic diastolic CHF (congestive heart failure) (HCC)   Type 2 diabetes mellitus with hyperglycemia, with long-term current use of insulin  (HCC)   CAD S/P percutaneous coronary angioplasty   Iron  deficiency anemia   Mixed connective tissue disease (HCC)   Gastroesophageal reflux disease   Morbid obesity (HCC)   Sepsis (HCC)   Sepsis secondary to right breast cellulitis Sepsis present on admission with tachycardia, tachypnea Blood cultures NGTD Nonpurulent in nature, significant edema Switched Vancomycin  --> Rocephin  Nystatin  powder for fungal infection  Multiple chronic wounds including abdomen, left breast, left lower extremity, labia Appreciate wound RN recommendations/wound care Antibiotics as above  Possible acute on chronic diastolic HF Appears overloaded BNP 106, falsely low in morbidly obese  patient Chest x-ray with mild to moderate CHF Echo with EF of 60 to 65%, left ventricular diastolic parameters are indeterminate Continue diuresis with IV Lasix  twice daily Strict I's and O's, daily weights Daily BMP while diuresing  CAD Aspirin , Imdur   Diabetes mellitus Continue Humulin  R, sliding scale insulin   Iron  deficiency anemia Microcytic anemia Ferrous sulfate   Hypothyroidism Euthyroid sick syndrome Synthroid  Repeat TSH as outpatient in 4 to 6 weeks  Chronic pain Oxycodone , gabapentin    DVT prophylaxis: Lovenox    Code Status: Full code Family Communication: None Disposition Plan: Home Status is: Inpatient Remains inpatient appropriate because: IV antibiotics    Antimicrobials:  Anti-infectives (From admission, onward)    Start     Dose/Rate Route Frequency Ordered Stop   09/11/23 1800  vancomycin  (VANCOCIN ) 1,750 mg in sodium chloride  0.9 % 500 mL IVPB  Status:  Discontinued        1,750 mg 258.8 mL/hr over 120 Minutes Intravenous Every 24 hours 09/10/23 2052 09/11/23 1309   09/11/23 1400  cefTRIAXone  (ROCEPHIN ) 2 g in sodium chloride  0.9 % 100 mL IVPB        2 g 200 mL/hr over 30 Minutes Intravenous Every 24 hours 09/11/23 1309 09/18/23 1359   09/11/23 0015  fluconazole  (DIFLUCAN ) tablet 100 mg  Status:  Discontinued        100 mg Oral Daily 09/10/23 2005 09/11/23 0948   09/10/23 2000  vancomycin  (VANCOCIN ) IVPB 1000 mg/200 mL premix  Status:  Discontinued        1,000 mg 200 mL/hr over 60 Minutes Intravenous  Once 09/10/23 1958 09/11/23 0005   09/10/23 1415  cefTRIAXone  (ROCEPHIN ) 2 g  in sodium chloride  0.9 % 100 mL IVPB        2 g 200 mL/hr over 30 Minutes Intravenous  Once 09/10/23 1406 09/10/23 1518   09/10/23 1415  vancomycin  (VANCOCIN ) IVPB 1000 mg/200 mL premix        1,000 mg 200 mL/hr over 60 Minutes Intravenous Every 1 hr x 2 09/10/23 1408 09/10/23 1923        Objective: Vitals:   09/11/23 2225 09/12/23 0154 09/12/23 0558 09/12/23  1303  BP: (!) 112/49 (!) 102/55 114/67 120/60  Pulse:   (!) 102 (!) 108  Resp: 20 20 20 20   Temp: 97.9 F (36.6 C) 98.7 F (37.1 C) 97.9 F (36.6 C) 97.9 F (36.6 C)  TempSrc: Oral Oral Oral Oral  SpO2: 95% 98% 100% 98%  Weight:      Height:        Intake/Output Summary (Last 24 hours) at 09/12/2023 1714 Last data filed at 09/12/2023 1509 Gross per 24 hour  Intake 1496.77 ml  Output 3900 ml  Net -2403.23 ml   Filed Weights   09/10/23 1321  Weight: 132.5 kg    Examination:  General: NAD  Cardiovascular: S1, S2 present Respiratory: Diminished breath sounds bilaterally Abdomen: Soft, nontender, distended, bowel sounds present Musculoskeletal: Noted right AKA  Skin: Multiple wounds noted, right breast with swelling/erythema/induration Psychiatry: Normal mood    Data Reviewed: I have personally reviewed following labs and imaging studies  CBC: Recent Labs  Lab 09/10/23 1434 09/11/23 0417 09/12/23 0448  WBC 10.1 9.5 8.7  NEUTROABS 7.0  --   --   HGB 8.2* 7.9* 7.9*  HCT 32.1* 31.5* 30.9*  MCV 67.6* 70.2* 67.9*  PLT 324 280 297   Basic Metabolic Panel: Recent Labs  Lab 09/10/23 1434 09/10/23 2047 09/11/23 0417 09/12/23 0755  NA 140  --  129* 136  K 4.6  --  4.3 4.0  CL 104  --  97* 98  CO2 22  --  23 30  GLUCOSE 180*  --  241* 218*  BUN 13  --  10 10  CREATININE 0.80  --  0.70 0.85  CALCIUM  9.1  --  8.1* 8.4*  MG  --  2.0  --   --    GFR: Estimated Creatinine Clearance: 90.8 mL/min (by C-G formula based on SCr of 0.85 mg/dL). Liver Function Tests: Recent Labs  Lab 09/10/23 1434 09/11/23 0417  AST 32 27  ALT 23 22  ALKPHOS 88 80  BILITOT 0.6 0.6  PROT 7.2 6.5  ALBUMIN 3.2* 2.8*   No results for input(s): LIPASE, AMYLASE in the last 168 hours. No results for input(s): AMMONIA in the last 168 hours. Coagulation Profile: Recent Labs  Lab 09/11/23 0417  INR 1.2   Cardiac Enzymes: Recent Labs  Lab 09/10/23 1434  CKTOTAL 121   BNP  (last 3 results) No results for input(s): PROBNP in the last 8760 hours. HbA1C: Recent Labs    09/10/23 2047  HGBA1C 8.0*   CBG: Recent Labs  Lab 09/11/23 1652 09/11/23 2219 09/12/23 0735 09/12/23 1140 09/12/23 1548  GLUCAP 168* 218* 202* 212* 267*   Lipid Profile: No results for input(s): CHOL, HDL, LDLCALC, TRIG, CHOLHDL, LDLDIRECT in the last 72 hours. Thyroid  Function Tests: Recent Labs    09/10/23 2047 09/11/23 0943  TSH 5.692*  --   FREET4  --  1.47*   Anemia Panel: No results for input(s): VITAMINB12, FOLATE, FERRITIN, TIBC, IRON , RETICCTPCT in the last 72 hours.  Sepsis Labs: No results for input(s): PROCALCITON, LATICACIDVEN in the last 168 hours.  Recent Results (from the past 240 hours)  Blood culture (routine x 2)     Status: None (Preliminary result)   Collection Time: 09/10/23  2:32 PM   Specimen: BLOOD  Result Value Ref Range Status   Specimen Description   Final    BLOOD RIGHT ANTECUBITAL Performed at [facility], [address] [place], [place] [address]    Special Requests   Final    BOTTLES DRAWN AEROBIC AND ANAEROBIC Blood Culture results may not be optimal due to an inadequate volume of blood received in culture bottles Performed at [facility], [address], [place], [place] [address]    Culture   Final    NO GROWTH 2 DAYS Performed at [facility], [address] [place], [place] [address]    Report Status PENDING  Incomplete  Blood culture (routine x 2)     Status: None (Preliminary result)   Collection Time: 09/10/23  2:34 PM   Specimen: BLOOD  Result Value Ref Range Status   Specimen Description   Final    BLOOD LEFT ANTECUBITAL Performed at [facility], [address], [place], [place] [address]    Special Requests   Final    BOTTLES DRAWN AEROBIC AND ANAEROBIC Blood Culture results may not be optimal due to an inadequate volume of  blood received in culture bottles Performed at [facility], [address], [place], [place] [address]    Culture   Final    NO GROWTH 2 DAYS Performed at [facility], [address] [place], [place] [address]    Report Status PENDING  Incomplete  Resp panel by RT-PCR (RSV, Flu A&B, Covid) Anterior Nasal Swab     Status: None   Collection Time: 09/10/23  2:59 PM   Specimen: Anterior Nasal Swab  Result Value Ref Range Status   SARS Coronavirus 2 by RT PCR NEGATIVE NEGATIVE Final    Comment: (NOTE) SARS-CoV-2 target nucleic acids are NOT DETECTED.  The SARS-CoV-2 RNA is generally detectable in upper respiratory specimens during the acute phase of infection. The lowest concentration of SARS-CoV-2 viral copies this assay can detect is 138 copies/mL. A negative result does not preclude SARS-Cov-2 infection and should not be used as the sole basis for treatment or other patient management decisions. A negative result may occur with  improper specimen collection/handling, submission of specimen other than nasopharyngeal swab, presence of viral mutation(s) within the areas targeted by this assay, and inadequate number of viral copies(<138 copies/mL). A negative result must be combined with clinical observations, patient history, and epidemiological information. The expected result is Negative.  Fact Sheet for Patients:  bloggercourse.com  Fact Sheet for Healthcare Providers:  seriousbroker.it  This test is no t yet approved or cleared by the [place]  FDA and  has been authorized for detection and/or diagnosis of SARS-CoV-2 by FDA under an Emergency Use Authorization (EUA). This EUA will remain  in effect (meaning this test can be used) for the duration of the COVID-19 declaration under Section 564(b)(1) of the Act, 21 U.S.C.section 360bbb-3(b)(1), unless the authorization is terminated  or revoked  sooner.       Influenza A by PCR NEGATIVE NEGATIVE Final   Influenza B by PCR NEGATIVE NEGATIVE Final    Comment: (NOTE) The Xpert Xpress SARS-CoV-2/FLU/RSV plus assay is intended as an aid in the  diagnosis of influenza from Nasopharyngeal swab specimens and should not be used as a sole basis for treatment. Nasal washings and aspirates are unacceptable for Xpert Xpress SARS-CoV-2/FLU/RSV testing.  Fact Sheet for Patients: bloggercourse.com  Fact Sheet for Healthcare Providers: seriousbroker.it  This test is not yet approved or cleared by the [place]  FDA and has been authorized for detection and/or diagnosis of SARS-CoV-2 by FDA under an Emergency Use Authorization (EUA). This EUA will remain in effect (meaning this test can be used) for the duration of the COVID-19 declaration under Section 564(b)(1) of the Act, 21 U.S.C. section 360bbb-3(b)(1), unless the authorization is terminated or revoked.     Resp Syncytial Virus by PCR NEGATIVE NEGATIVE Final    Comment: (NOTE) Fact Sheet for Patients: bloggercourse.com  Fact Sheet for Healthcare Providers: seriousbroker.it  This test is not yet approved or cleared by the [place]  FDA and has been authorized for detection and/or diagnosis of SARS-CoV-2 by FDA under an Emergency Use Authorization (EUA). This EUA will remain in effect (meaning this test can be used) for the duration of the COVID-19 declaration under Section 564(b)(1) of the Act, 21 U.S.C. section 360bbb-3(b)(1), unless the authorization is terminated or revoked.  Performed at [facility], [address], [place], [place] [address]   MRSA Next Gen by PCR, Nasal     Status: Abnormal   Collection Time: 09/11/23 12:13 PM   Specimen: Nasal Mucosa; Nasal Swab  Result Value Ref Range Status   MRSA by PCR Next Gen DETECTED (A) NOT DETECTED  Final    Comment: RESULT CALLED TO, READ BACK BY AND VERIFIED WITH: RICHDERSON, B. RN AT 1402 ON 09/11/2023 BY MECIAL J. (NOTE) The GeneXpert MRSA Assay (FDA approved for NASAL specimens only), is one component of a comprehensive MRSA colonization surveillance program. It is not intended to diagnose MRSA infection nor to guide or monitor treatment for MRSA infections. Test performance is not FDA approved in patients less than 30 years old. Performed at [facility], [address], [place], [place] [address]       Radiology Studies: ECHOCARDIOGRAM COMPLETE Result Date: 09/12/2023    ECHOCARDIOGRAM REPORT   Patient Name:   [name] Date of Exam: 09/12/2023 Medical Rec #:  [id number]      Height:       62.5 in Accession #:    [id number]     Weight:       292.0 lb Date of Birth:  [birth year]     BSA:          2.259 m Patient Age:    62 years       BP:           114/67 mmHg Patient Gender: F              HR:           108 bpm. Exam Location:  Inpatient Procedure: 2D Echo, Cardiac Doppler, Color Doppler and Intracardiac            Opacification Agent Indications:    CHF- Acute Diastolic  History:        Patient has prior history of Echocardiogram examinations, most                 recent 05/29/2021. CAD; Risk Factors:Diabetes and Former Smoker.  Sonographer:    Ozell Free Referring Phys: [id number] JENNIFER CHOI  Sonographer Comments: Technically challenging study due to limited acoustic windows and patient is  obese. IMPRESSIONS  1. Left ventricular ejection fraction, by estimation, is 60 to 65%. The left ventricle has normal function. Left ventricular endocardial border not optimally defined to evaluate regional wall motion. Left ventricular diastolic parameters are indeterminate.  2. RV not well visualized, grossly appears normal in size and function. . Right ventricular systolic function was not well visualized. The right ventricular size is not well visualized. Tricuspid  regurgitation signal is inadequate for assessing PA pressure.  3. The mitral valve was not well visualized. No evidence of mitral valve regurgitation. No evidence of mitral stenosis.  4. The aortic valve was not well visualized. Aortic valve regurgitation is not visualized. No aortic stenosis is present.  5. Technically difficult study, poor visualization. FINDINGS  Left Ventricle: Left ventricular ejection fraction, by estimation, is 60 to 65%. The left ventricle has normal function. Left ventricular endocardial border not optimally defined to evaluate regional wall motion. Definity  contrast agent was given IV to delineate the left ventricular endocardial borders. The left ventricular internal cavity size was normal in size. There is no left ventricular hypertrophy. Left ventricular diastolic parameters are indeterminate. Right Ventricle: RV not well visualized, grossly appears normal in size and function. The right ventricular size is not well visualized. Right vetricular wall thickness was not well visualized. Right ventricular systolic function was not well visualized.  Tricuspid regurgitation signal is inadequate for assessing PA pressure. Left Atrium: Left atrial size was not well visualized. Right Atrium: Right atrial size was not well visualized. Pericardium: The pericardium was not well visualized. Mitral Valve: The mitral valve was not well visualized. No evidence of mitral valve regurgitation. No evidence of mitral valve stenosis. Tricuspid Valve: The tricuspid valve is not well visualized. Tricuspid valve regurgitation is not demonstrated. No evidence of tricuspid stenosis. Aortic Valve: The aortic valve was not well visualized. Aortic valve regurgitation is not visualized. No aortic stenosis is present. Aortic valve mean gradient measures 8.0 mmHg. Aortic valve peak gradient measures 16.3 mmHg. Pulmonic Valve: The pulmonic valve was not well visualized. Pulmonic valve regurgitation is not visualized. No  evidence of pulmonic stenosis. Aorta: The aortic root was not well visualized. IAS/Shunts: The interatrial septum was not well visualized.   Diastology LV e' medial:    6.53 cm/s LV E/e' medial:  20.4 LV e' lateral:   7.83 cm/s LV E/e' lateral: 17.0  RIGHT VENTRICLE RV Basal diam:  4.00 cm RV S prime:     11.30 cm/s TAPSE (M-mode): 1.7 cm LEFT ATRIUM             Index        RIGHT ATRIUM           Index LA Vol (A2C):   75.3 ml 33.34 ml/m  RA Area:     21.60 cm LA Vol (A4C):   80.2 ml 35.51 ml/m  RA Volume:   76.40 ml  33.83 ml/m LA Biplane Vol: 81.0 ml 35.86 ml/m  AORTIC VALVE AV Vmax:           202.00 cm/s AV Vmean:          129.000 cm/s AV VTI:            0.309 m AV Peak Grad:      16.3 mmHg AV Mean Grad:      8.0 mmHg LVOT Vmax:         92.30 cm/s LVOT Vmean:        58.700 cm/s LVOT VTI:  0.148 m LVOT/AV VTI ratio: 0.48 MITRAL VALVE MV Area (PHT): 5.88 cm     SHUNTS MV Decel Time: 129 msec     Systemic VTI: 0.15 m MV E velocity: 133.00 cm/s MV A velocity: 94.30 cm/s MV E/A ratio:  1.41 Dorn Ross MD Electronically signed by Dorn Ross MD Signature Date/Time: 09/12/2023/2:40:08 PM    Final    DG CHEST PORT 1 VIEW Result Date: 09/11/2023 CLINICAL DATA:  Short of breath EXAM: PORTABLE CHEST 1 VIEW COMPARISON:  09/10/2023 FINDINGS: Single frontal view of the chest demonstrates an enlarged cardiac silhouette. Increased pulmonary vascular congestion and diffuse interstitial prominence consistent with congestive heart failure and interstitial edema. Patchy consolidation at the right lung base may reflect asymmetric edema or atelectasis. No pneumothorax. No acute bony abnormalities. IMPRESSION: 1. Findings consistent with mild to moderate congestive heart failure. Electronically Signed   By: Ozell Daring M.D.   On: 09/11/2023 19:37      Scheduled Meds:  aspirin  EC  81 mg Oral Daily   Chlorhexidine  Gluconate Cloth  6 each Topical Q0600   enoxaparin  (LOVENOX ) injection  60 mg  Subcutaneous Q24H   ferrous sulfate   325 mg Oral BID WC   fluticasone   1 spray Each Nare Daily   furosemide   40 mg Intravenous BID   gabapentin   400 mg Oral BID   gabapentin   800 mg Oral QHS   Gerhardt's butt cream   Topical BID   insulin  aspart  0-20 Units Subcutaneous TID WC   insulin  aspart  0-5 Units Subcutaneous QHS   insulin  regular human CONCENTRATED  5 Units Subcutaneous TID WC   isosorbide  mononitrate  60 mg Oral Daily   levothyroxine   200 mcg Oral QAC breakfast   mupirocin  ointment  1 Application Nasal BID   mupirocin  ointment   Topical BID   nystatin    Topical TID   pantoprazole   40 mg Oral Daily   pramipexole   1 mg Oral TID   pregabalin   50 mg Oral TID   sodium chloride  flush  3 mL Intravenous Q12H   Continuous Infusions:  cefTRIAXone  (ROCEPHIN )  IV 2 g (09/12/23 1448)     LOS: 2 days     Lebron JINNY Cage, MD [facility] 09/12/2023, 5:14 PM   Available via Epic secure chat 7am-7pm After these hours, please refer to coverage provider listed on [email or web address]

## 2023-09-12 NOTE — Evaluation (Signed)
 Occupational Therapy Evaluation Patient Details Name: [name] MRN: [id number] DOB: 05-13-1961 Today's Date: 09/12/2023   History of Present Illness [name] is a 63 y.o.presented 09/10/23 from PCP for worsening Right breast cellulitis, LLE cellulitis. PMH: morbid obesity, right AKA, irritable bowel syndrome, hypertension, hyperlipidemia, hypothyroidism, type 2 diabetes mellitus, rheumatoid arthritis, asthma   Clinical Impression   Patient is a 63 year old female who was admitted for above. Patient was living at home with husband support prior level. Currently, patient is limited with pain in R shoulder and L knee impacting ability to move off either side of bed at this time. Patient would continue to benefit from skilled OT services at this time while admitted and after d/c to address noted deficits in order to improve overall safety and independence in ADLs. Patient's husband noted in char to be unable to support patient at current level of care. Patient will benefit from continued inpatient follow up therapy, <3 hours/day.       If plan is discharge home, recommend the following: Two people to help with walking and/or transfers;Two people to help with bathing/dressing/bathroom;Assistance with cooking/housework;Direct supervision/assist for medications management;Assist for transportation;Help with stairs or ramp for entrance;Direct supervision/assist for financial management    Functional Status Assessment  Patient has had a recent decline in their functional status and demonstrates the ability to make significant improvements in function in a reasonable and predictable amount of time.  Equipment Recommendations  None recommended by OT       Precautions / Restrictions Precautions Precautions: Fall Precaution Comments: R AKA Restrictions Weight Bearing Restrictions Per Provider Order: No      Mobility Bed Mobility Overal bed mobility: Needs Assistance              General bed mobility comments: unable to progress to EOB with increased pain in R shoulder and L knee impacting attempts on either side of bed.            ADL either performed or assessed with clinical judgement   ADL Overall ADL's : Needs assistance/impaired Eating/Feeding: Modified independent;Bed level   Grooming: Bed level;Minimal assistance   Upper Body Bathing: Minimal assistance;Bed level   Lower Body Bathing: Total assistance;Bed level   Upper Body Dressing : Bed level;Moderate assistance   Lower Body Dressing: Bed level;Total assistance     Toilet Transfer Details (indicate cue type and reason): patient unable to progress to either side of bed with increased pain reported in R shoulder and L knee with attempts. Toileting- Clothing Manipulation and Hygiene: Bed level;Total assistance                            Pertinent Vitals/Pain Pain Assessment Pain Assessment: Faces Faces Pain Scale: Hurts whole lot Pain Location: right shoulder, L knee Pain Descriptors / Indicators: Cramping, Discomfort, Guarding, Grimacing Pain Intervention(s): Limited activity within patient's tolerance, Monitored during session, Repositioned     Extremity/Trunk Assessment Upper Extremity Assessment Upper Extremity Assessment: RUE deficits/detail RUE Deficits / Details: patient reporting pain in shoulder able to FF about 70 degrees with increased grimancing. grip strength WFL RUE: Unable to fully assess due to pain   Lower Extremity Assessment Lower Extremity Assessment: Defer to PT evaluation       Communication Communication Communication: No apparent difficulties   Cognition Arousal: Alert Behavior During Therapy: WFL for tasks assessed/performed Overall Cognitive Status: Within Functional Limits for tasks assessed  Home Living Family/patient expects to be discharged to:: Private residence Living Arrangements: Spouse/significant  other Available Help at Discharge: Available 24 hours/day;Family Type of Home: House Home Access: Ramped entrance     Home Layout: Two level;Able to live on main level with bedroom/bathroom               Home Equipment: Wheelchair - power;Wheelchair - Forensic Psychologist (2 wheels);Rollator (4 wheels);BSC/3in1          Prior Functioning/Environment Prior Level of Function : Needs assist             Mobility Comments: states  has been able to hop 10 steps until recently, transfers to WC/toilet ADLs Comments: transfers to tub bench        OT Problem List: Decreased strength;Decreased activity tolerance;Impaired UE functional use;Pain;Increased edema;Obesity;Decreased knowledge of precautions;Decreased safety awareness;Decreased knowledge of use of DME or AE      OT Treatment/Interventions: [name]/ADL training;Therapeutic exercise;Therapeutic activities;Patient/family education;Balance training;DME and/or AE instruction    OT Goals(Current goals can be found in the care plan section) Acute Rehab OT Goals Patient Stated Goal: to go home OT Goal Formulation: With patient Time For Goal Achievement: 09/26/23 Potential to Achieve Goals: Fair  OT Frequency: Min 1X/week       AM-PAC OT 6 Clicks Daily Activity     Outcome Measure Help from another person eating meals?: A Little Help from another person taking care of personal grooming?: A Little Help from another person toileting, which includes using toliet, bedpan, or urinal?: Total Help from another person bathing (including washing, rinsing, drying)?: Total Help from another person to put on and taking off regular upper body clothing?: A Lot Help from another person to put on and taking off regular lower body clothing?: Total 6 Click Score: 11   End of Session Nurse Communication: Mobility status  Activity Tolerance: Patient limited by pain Patient left: in bed;with call bell/phone within reach  OT Visit  Diagnosis: Pain;Unsteadiness on feet (R26.81);History of falling (Z91.81)                Time: 8995-8974 OT Time Calculation (min): 21 min Charges:  OT General Charges $OT Visit: 1 Visit OT Evaluation $OT Eval Moderate Complexity: 1 Mod  [name] OTR/L, MS Acute Rehabilitation Department Office# [phone]   [name] 09/12/2023, 12:32 PM

## 2023-09-12 NOTE — Progress Notes (Signed)
 Report given to [facility] on 1424 4W for telemetry transfer

## 2023-09-12 NOTE — Progress Notes (Signed)
  Echocardiogram 2D Echocardiogram has been performed.  [name] 09/12/2023, 1:47 PM

## 2023-09-12 NOTE — TOC Initial Note (Signed)
 Transition of Care [facility]) - Initial/Assessment Note    Patient Details  Name: [name] MRN: [id number] Date of Birth: May 21, 1961  [facility] CM/SW Contact:    [name] CHRISTELLA Eva, LCSW Phone Number: 09/12/2023, 2:55 PM  Clinical Narrative:                 CSW met with the pt regarding SNF recommendations; the pt has declined. CSW discussed home health services. Pt reports that she thinks she is losing her medical insurance. CSW informed the patient that, according to her chart, she has an active Land. Pt reports that she will follow up with [email or web address]. CSW suggested the patient apply for Medicaid as well and attached resources to the patient's AVS. CSW explained the barriers to securing home health services with the patient's commercial plan.The pt denies any DME needs and will need PTAR transport home. CSW will work on arranging home health services. TOC to follow  Expected Discharge Plan: Home w Home Health Services Barriers to Discharge: Continued Medical Work up   Patient Goals and CMS Choice Patient states their goals for this hospitalization and ongoing recovery are:: retrun home with home health          Expected Discharge Plan and Services       Living arrangements for the past 2 months: Single Family Home                                      Prior Living Arrangements/Services Living arrangements for the past 2 months: Single Family Home Lives with:: Self, Spouse Patient language and need for interpreter reviewed:: Yes Do you feel safe going back to the place where you live?: Yes      Need for Family Participation in Patient Care: No (Comment) Care giver support system in place?: No (comment)   Criminal Activity/Legal Involvement Pertinent to Current Situation/Hospitalization: No - Comment as needed  Activities of Daily Living   ADL Screening (condition at time of admission) Independently performs ADLs?:  No Does the patient have a NEW difficulty with bathing/dressing/toileting/self-feeding that is expected to last >3 days?: Yes (Initiates electronic notice to provider for possible OT consult) Does the patient have a NEW difficulty with getting in/out of bed, walking, or climbing stairs that is expected to last >3 days?: Yes (Initiates electronic notice to provider for possible PT consult) Does the patient have a NEW difficulty with communication that is expected to last >3 days?: No Is the patient deaf or have difficulty hearing?: No Does the patient have difficulty seeing, even when wearing glasses/contacts?: No Does the patient have difficulty concentrating, remembering, or making decisions?: No  Permission Sought/Granted                  Emotional Assessment Appearance:: Appears stated age Attitude/Demeanor/Rapport: Gracious, Engaged Affect (typically observed): Accepting Orientation: : Oriented to Place, Oriented to  Time, Oriented to Situation, Oriented to Self   Psych Involvement: No (comment)  Admission diagnosis:  Cellulitis [L03.90] Patient Active Problem List   Diagnosis Date Noted   Cellulitis 09/10/2023   RLS (restless legs syndrome) 01/08/2023   Osteomyelitis (HCC) 07/07/2022   Medication management 07/07/2022   Diabetic ulcer of left midfoot associated with diabetes mellitus due to underlying condition, with necrosis of muscle (HCC)    Diabetic foot infection (HCC) 06/14/2022   Sacral decubitus ulcer, stage II (HCC) 12/24/2021  Class 3 obesity 12/23/2021   Iron  deficiency anemia 12/23/2021   Chronic diastolic CHF (congestive heart failure) (HCC) 12/22/2021   Hyperkalemia 12/22/2021   Left leg cellulitis 12/20/2021   Peripheral vascular disease, unspecified (HCC) 10/10/2021   Dehiscence of amputation stump (HCC)    Acute blood loss anemia 06/24/2021   Postoperative wound infection 06/24/2021   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)    Bacteremia due to Enterococcus 05/28/2021   Digestive disorder 05/18/2021   Bilateral lower extremity edema 05/02/2021   Statin myopathy 03/04/2021   Aortic stenosis 03/04/2021   Sjogren's syndrome (HCC) 02/11/2020   Cutaneous abscess of right foot    Statin intolerance 08/16/2019   Gram-negative bacteremia 10/18/2018   Streptococcal bacteremia 10/18/2018   CAD S/P percutaneous coronary angioplasty 04/19/2018   Essential hypertension    Moderate persistent asthma 03/21/2018   NAFLD (nonalcoholic fatty liver disease)    Recurrent cellulitis of lower extremity 01/02/2018   Cellulitis of lower leg 01/02/2018   Chronic diarrhea 05/08/2017   H/O Clostridium difficile infection 05/08/2017   Rectal bleeding 05/08/2017   Hyperbilirubinemia 04/29/2016   Hyponatremia 04/29/2016   Sepsis (HCC) 04/28/2016   Cellulitis of right foot 03/09/2016   Allergic rhinitis due to pollen 02/15/2016   Anaphylactic reaction due to food 02/10/2016   Type 2 diabetes mellitus with hyperglycemia, with long-term current use of insulin  (HCC) 02/10/2016   Gastroesophageal reflux disease without esophagitis 02/10/2016   Atopic eczema 02/10/2016   Cervical nerve root disorder 12/15/2015   Lumbar radiculopathy 12/15/2015   Daytime somnolence 11/26/2015   Venous stasis dermatitis of both lower extremities 02/11/2015   Morbid obesity (HCC) 06/19/2014   Abnormal LFTs 03/26/2014   B-complex deficiency 03/26/2014   Benign essential HTN 03/26/2014   Chronic pain associated with significant psychosocial dysfunction 03/26/2014   Diaphragmatic hernia 03/26/2014   Gastroesophageal reflux disease 03/26/2014   Hammer toe 03/26/2014   H/O neoplasm 03/26/2014   Adaptive colitis 03/26/2014   Deafness, sensorineural 03/26/2014   Fibromyalgia 01/20/2014   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/20/2014   Hyperlipidemia 11/11/2012   Mixed connective tissue disease (HCC) 11/11/2012   Hypothyroidism 11/11/2012    Uncomplicated asthma 11/11/2012   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HCC) 02/20/2012   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   ANA positive 07/17/2011   PCP:  [name], DO Pharmacy:   [facility] [address], [place] - [address] [place] [address] Phone: [phone] Fax: [phone]     Social Drivers of Health (SDOH) Social History: SDOH Screenings   Food Insecurity: No Food Insecurity (09/11/2023)  Housing: High Risk (09/11/2023)  Transportation Needs: Unmet Transportation Needs (09/11/2023)  Utilities: Not At Risk (09/11/2023)  Alcohol  Screen: Low Risk  (09/10/2023)  Depression (PHQ2-9): Low Risk  (07/10/2023)  Financial Resource Strain: Medium Risk (09/10/2023)  Physical Activity: Unknown (09/10/2023)  Social Connections: Socially Isolated (09/10/2023)  Stress: No Stress Concern Present (09/10/2023)  Tobacco Use: Medium Risk (09/10/2023)   SDOH Interventions:     Readmission Risk Interventions    12/23/2021    4:00 PM  Readmission Risk Prevention Plan  Transportation Screening Complete  Medication Review (RN Care Manager) Complete  PCP or Specialist appointment within 3-5 days of discharge Complete  HRI or Home Care Consult Complete  SW Recovery Care/Counseling Consult Complete  Palliative Care Screening Not Applicable  Skilled Nursing Facility Not Applicable

## 2023-09-13 DIAGNOSIS — L03313 Cellulitis of chest wall: Secondary | ICD-10-CM | POA: Diagnosis not present

## 2023-09-13 LAB — CBC WITH DIFFERENTIAL/PLATELET
Abs Immature Granulocytes: 0.06 10*3/uL (ref 0.00–0.07)
Basophils Absolute: 0.1 10*3/uL (ref 0.0–0.1)
Basophils Relative: 1 %
Eosinophils Absolute: 0.5 10*3/uL (ref 0.0–0.5)
Eosinophils Relative: 5 %
HCT: 30.4 % — ABNORMAL LOW (ref 36.0–46.0)
Hemoglobin: 7.9 g/dL — ABNORMAL LOW (ref 12.0–15.0)
Immature Granulocytes: 1 %
Lymphocytes Relative: 19 %
Lymphs Abs: 1.7 10*3/uL (ref 0.7–4.0)
MCH: 17.7 pg — ABNORMAL LOW (ref 26.0–34.0)
MCHC: 26 g/dL — ABNORMAL LOW (ref 30.0–36.0)
MCV: 68.2 fL — ABNORMAL LOW (ref 80.0–100.0)
Monocytes Absolute: 1 10*3/uL (ref 0.1–1.0)
Monocytes Relative: 11 %
Neutro Abs: 5.9 10*3/uL (ref 1.7–7.7)
Neutrophils Relative %: 63 %
Platelets: 308 10*3/uL (ref 150–400)
RBC: 4.46 MIL/uL (ref 3.87–5.11)
RDW: 24 % — ABNORMAL HIGH (ref 11.5–15.5)
WBC: 9.3 10*3/uL (ref 4.0–10.5)
nRBC: 0 % (ref 0.0–0.2)

## 2023-09-13 LAB — BASIC METABOLIC PANEL
Anion gap: 8 (ref 5–15)
BUN: 10 mg/dL (ref 8–23)
CO2: 32 mmol/L (ref 22–32)
Calcium: 8.4 mg/dL — ABNORMAL LOW (ref 8.9–10.3)
Chloride: 97 mmol/L — ABNORMAL LOW (ref 98–111)
Creatinine, Ser: 0.81 mg/dL (ref 0.44–1.00)
GFR, Estimated: >60 mL/min (ref >60)
Glucose, Bld: 184 mg/dL — ABNORMAL HIGH (ref 70–99)
Potassium: 3.5 mmol/L (ref 3.5–5.1)
Sodium: 137 mmol/L (ref 135–145)

## 2023-09-13 LAB — GLUCOSE, CAPILLARY
Glucose-Capillary: 167 mg/dL — ABNORMAL HIGH (ref 70–99)
Glucose-Capillary: 241 mg/dL — ABNORMAL HIGH (ref 70–99)
Glucose-Capillary: 207 mg/dL — ABNORMAL HIGH (ref 70–99)
Glucose-Capillary: 245 mg/dL — ABNORMAL HIGH (ref 70–99)

## 2023-09-13 LAB — VITAMIN B12: Vitamin B-12: 672 pg/mL (ref 180–914)

## 2023-09-13 LAB — FOLATE: Folate: 30.2 ng/mL (ref >5.9)

## 2023-09-13 LAB — D-DIMER, QUANTITATIVE: D-Dimer, Quant: 3.05 ug{FEU}/mL — ABNORMAL HIGH (ref 0.00–0.50)

## 2023-09-13 LAB — MAGNESIUM: Magnesium: 1.8 mg/dL (ref 1.7–2.4)

## 2023-09-13 MED ORDER — LEVALBUTEROL HCL 0.63 MG/3ML IN NEBU
0.6300 mg | INHALATION_SOLUTION | Freq: Four times a day (QID) | RESPIRATORY_TRACT | Status: DC
  Administered 2023-09-13 (×2): 0.63 mg via RESPIRATORY_TRACT
  Filled 2023-09-13 (×2): qty 3

## 2023-09-13 MED ORDER — ALBUTEROL SULFATE (2.5 MG/3ML) 0.083% IN NEBU
2.5000 mg | INHALATION_SOLUTION | RESPIRATORY_TRACT | Status: DC | PRN
  Administered 2023-09-14 – 2023-09-30 (×17): 2.5 mg via RESPIRATORY_TRACT
  Filled 2023-09-13 (×17): qty 3

## 2023-09-13 MED ORDER — SODIUM CHLORIDE 0.9 % IV SOLN
100.0000 mg | Freq: Once | INTRAVENOUS | Status: AC
  Administered 2023-09-13: 100 mg via INTRAVENOUS
  Filled 2023-09-13: qty 5

## 2023-09-13 MED ORDER — HYDROCOD POLI-CHLORPHE POLI ER 10-8 MG/5ML PO SUER
5.0000 mL | Freq: Once | ORAL | Status: AC
  Administered 2023-09-15: 5 mL via ORAL
  Filled 2023-09-13: qty 5

## 2023-09-13 MED ORDER — LIDOCAINE 5 % EX PTCH
1.0000 | MEDICATED_PATCH | Freq: Once | CUTANEOUS | Status: AC
  Administered 2023-09-14: 1 via TRANSDERMAL
  Filled 2023-09-13: qty 1

## 2023-09-13 MED ORDER — DIPHENHYDRAMINE HCL 25 MG PO CAPS
25.0000 mg | ORAL_CAPSULE | Freq: Once | ORAL | Status: AC
  Administered 2023-09-13: 25 mg via ORAL
  Filled 2023-09-13: qty 1

## 2023-09-13 MED ORDER — POTASSIUM CHLORIDE CRYS ER 20 MEQ PO TBCR
40.0000 meq | EXTENDED_RELEASE_TABLET | Freq: Every day | ORAL | Status: DC
  Administered 2023-09-13: 40 meq via ORAL
  Filled 2023-09-13: qty 2

## 2023-09-13 MED ORDER — DIPHENHYDRAMINE HCL 25 MG PO CAPS: 50.0000 mg | ORAL_CAPSULE | Freq: Once | ORAL | Status: DC

## 2023-09-13 MED ORDER — LEVALBUTEROL HCL 0.63 MG/3ML IN NEBU
0.6300 mg | INHALATION_SOLUTION | Freq: Three times a day (TID) | RESPIRATORY_TRACT | Status: DC
  Administered 2023-09-13 – 2023-09-15 (×5): 0.63 mg via RESPIRATORY_TRACT
  Filled 2023-09-13 (×5): qty 3

## 2023-09-13 MED ORDER — METOPROLOL TARTRATE 5 MG/5ML IV SOLN
2.5000 mg | Freq: Three times a day (TID) | INTRAVENOUS | Status: DC | PRN
  Administered 2023-09-14: 2.5 mg via INTRAVENOUS
  Filled 2023-09-13: qty 5

## 2023-09-13 MED ORDER — KETOROLAC TROMETHAMINE 15 MG/ML IJ SOLN
15.0000 mg | Freq: Once | INTRAMUSCULAR | Status: AC
  Administered 2023-09-14: 15 mg via INTRAVENOUS
  Filled 2023-09-13: qty 1

## 2023-09-13 NOTE — Progress Notes (Signed)
 PROGRESS NOTE    [name]  FMW:[id number] DOB: 1960/11/08 DOA: 09/10/2023 PCP: [name], DO     Brief Narrative:  [name] is a 63 y.o. female with medical history significant of morbid obesity, right AKA, irritable bowel syndrome, HTN, HLD, DM, RA, asthma who presented to ED at her PCPs recommendation with worsening cellulitis of right breast.  Reportedly, patient initially presented to ED on 09/03/2023 and was diagnosed with left lower extremity as well as cellulitis under right breast and was discharged home on cefadroxil .  Patient continued to take medications as prescribed however her cellulitis continued to get worse.  She was seen by her PCP via virtual visit and was advised to come to the emergency department.  She was started on vancomycin .  New events last 24 hours / Subjective: Still short of breath, with noted mild wheezing.  Continues to diurese well, denies any chest pain, fever/chills, abdominal pain, nausea/vomiting.    Assessment & Plan:   Principal Problem:   Cellulitis Active Problems:   Chronic diastolic CHF (congestive heart failure) (HCC)   Type 2 diabetes mellitus with hyperglycemia, with long-term current use of insulin  (HCC)   CAD S/P percutaneous coronary angioplasty   Iron  deficiency anemia   Mixed connective tissue disease (HCC)   Gastroesophageal reflux disease   Morbid obesity (HCC)   Sepsis (HCC)   Sepsis secondary to right breast cellulitis Sepsis present on admission with tachycardia, tachypnea Blood cultures NGTD Nonpurulent in nature, significant edema Switched Vancomycin  --> Rocephin  Nystatin  powder for fungal infection  Multiple chronic wounds including abdomen, left breast, left lower extremity, labia Appreciate wound RN recommendations/wound care Antibiotics as above  Possible acute on chronic diastolic HF Appears overloaded BNP 106, falsely low in morbidly obese patient Chest x-ray with mild to moderate  CHF Echo with EF of 60 to 65%, left ventricular diastolic parameters are indeterminate Continue diuresis with IV Lasix  twice daily Strict I's and O's, daily weights Daily BMP while diuresing Supplemental O2 as needed  History of asthma/?COPD Patient reports history of asthma Xopenex  ordered, stated Dulera  never worked for her and insist on having albuterol  as needed inhaler Supplemental O2 as needed  Acute on chronic hypoxic respiratory failure Currently requiring about 3 L of O2 Management as above  CAD Aspirin , Imdur   Diabetes mellitus Continue Humulin  R, sliding scale insulin   Iron  deficiency anemia Microcytic anemia Anemia panel showed: Iron  13, TIBC 536, sats 2.4, ferritin 5.3 Folate, vitamin B12 pending Received 1 dose of IV iron  on 09/13/2023 Continue ferrous sulfate   Hypothyroidism Euthyroid sick syndrome Synthroid  Repeat TSH as outpatient in 4 to 6 weeks  Chronic pain Oxycodone , gabapentin    DVT prophylaxis: Lovenox    Code Status: Full code Family Communication: None Disposition Plan: Home Status is: Inpatient Remains inpatient appropriate because: Level of care    Antimicrobials:  Anti-infectives (From admission, onward)    Start     Dose/Rate Route Frequency Ordered Stop   09/11/23 1800  vancomycin  (VANCOCIN ) 1,750 mg in sodium chloride  0.9 % 500 mL IVPB  Status:  Discontinued        1,750 mg 258.8 mL/hr over 120 Minutes Intravenous Every 24 hours 09/10/23 2052 09/11/23 1309   09/11/23 1400  cefTRIAXone  (ROCEPHIN ) 2 g in sodium chloride  0.9 % 100 mL IVPB        2 g 200 mL/hr over 30 Minutes Intravenous Every 24 hours 09/11/23 1309 09/18/23 1359   09/11/23 0015  fluconazole  (DIFLUCAN ) tablet 100 mg  Status:  Discontinued        100 mg Oral Daily 09/10/23 2005 09/11/23 0948   09/10/23 2000  vancomycin  (VANCOCIN ) IVPB 1000 mg/200 mL premix  Status:  Discontinued        1,000 mg 200 mL/hr over 60 Minutes Intravenous  Once 09/10/23 1958 09/11/23 0005    09/10/23 1415  cefTRIAXone  (ROCEPHIN ) 2 g in sodium chloride  0.9 % 100 mL IVPB        2 g 200 mL/hr over 30 Minutes Intravenous  Once 09/10/23 1406 09/10/23 1518   09/10/23 1415  vancomycin  (VANCOCIN ) IVPB 1000 mg/200 mL premix        1,000 mg 200 mL/hr over 60 Minutes Intravenous Every 1 hr x 2 09/10/23 1408 09/10/23 1923        Objective: Vitals:   09/13/23 0746 09/13/23 1029 09/13/23 1031 09/13/23 1320  BP:    95/77  Pulse:    (!) 109  Resp:    20  Temp:    98.6 F (37 C)  TempSrc:    Oral  SpO2:  (!) 86% 96% 94%  Weight: (!) 157.5 kg     Height:        Intake/Output Summary (Last 24 hours) at 09/13/2023 1332 Last data filed at 09/13/2023 1200 Gross per 24 hour  Intake 120 ml  Output 3750 ml  Net -3630 ml   Filed Weights   09/10/23 1321 09/13/23 0746  Weight: 132.5 kg (!) 157.5 kg    Examination:  General: NAD  Cardiovascular: S1, S2 present Respiratory: Diminished breath sounds bilaterally Abdomen: Soft, nontender, distended, bowel sounds present Musculoskeletal: Noted right AKA  Skin: Multiple wounds noted, right breast with swelling/erythema/induration Psychiatry: Normal mood    Data Reviewed: I have personally reviewed following labs and imaging studies  CBC: Recent Labs  Lab 09/10/23 1434 09/11/23 0417 09/12/23 0448 09/13/23 0424  WBC 10.1 9.5 8.7 9.3  NEUTROABS 7.0  --   --  5.9  HGB 8.2* 7.9* 7.9* 7.9*  HCT 32.1* 31.5* 30.9* 30.4*  MCV 67.6* 70.2* 67.9* 68.2*  PLT 324 280 297 308   Basic Metabolic Panel: Recent Labs  Lab 09/10/23 1434 09/10/23 2047 09/11/23 0417 09/12/23 0755 09/13/23 0424  NA 140  --  129* 136 137  K 4.6  --  4.3 4.0 3.5  CL 104  --  97* 98 97*  CO2 22  --  23 30 32  GLUCOSE 180*  --  241* 218* 184*  BUN 13  --  10 10 10   CREATININE 0.80  --  0.70 0.85 0.81  CALCIUM  9.1  --  8.1* 8.4* 8.4*  MG  --  2.0  --   --   --    GFR: Estimated Creatinine Clearance: 106.6 mL/min (by C-G formula based on SCr of 0.81  mg/dL). Liver Function Tests: Recent Labs  Lab 09/10/23 1434 09/11/23 0417  AST 32 27  ALT 23 22  ALKPHOS 88 80  BILITOT 0.6 0.6  PROT 7.2 6.5  ALBUMIN 3.2* 2.8*   No results for input(s): LIPASE, AMYLASE in the last 168 hours. No results for input(s): AMMONIA in the last 168 hours. Coagulation Profile: Recent Labs  Lab 09/11/23 0417  INR 1.2   Cardiac Enzymes: Recent Labs  Lab 09/10/23 1434  CKTOTAL 121   BNP (last 3 results) No results for input(s): PROBNP in the last 8760 hours. HbA1C: Recent Labs    09/10/23 2047  HGBA1C 8.0*   CBG: Recent Labs  Lab  09/12/23 1140 09/12/23 1548 09/12/23 2226 09/13/23 0727 09/13/23 1153  GLUCAP 212* 267* 207* 167* 241*   Lipid Profile: No results for input(s): CHOL, HDL, LDLCALC, TRIG, CHOLHDL, LDLDIRECT in the last 72 hours. Thyroid  Function Tests: Recent Labs    09/10/23 2047 09/11/23 0943  TSH 5.692*  --   FREET4  --  1.47*   Anemia Panel: No results for input(s): VITAMINB12, FOLATE, FERRITIN, TIBC, IRON , RETICCTPCT in the last 72 hours. Sepsis Labs: No results for input(s): PROCALCITON, LATICACIDVEN in the last 168 hours.  Recent Results (from the past 240 hours)  Blood culture (routine x 2)     Status: None (Preliminary result)   Collection Time: 09/10/23  2:32 PM   Specimen: BLOOD  Result Value Ref Range Status   Specimen Description   Final    BLOOD RIGHT ANTECUBITAL Performed at [facility], [address], [place], [place] [address]    Special Requests   Final    BOTTLES DRAWN AEROBIC AND ANAEROBIC Blood Culture results may not be optimal due to an inadequate volume of blood received in culture bottles Performed at [facility], [address], [place], [place] [address]    Culture   Final    NO GROWTH 3 DAYS Performed at [facility], [address], [place], [place] [address]    Report Status PENDING  Incomplete   Blood culture (routine x 2)     Status: None (Preliminary result)   Collection Time: 09/10/23  2:34 PM   Specimen: BLOOD  Result Value Ref Range Status   Specimen Description   Final    BLOOD LEFT ANTECUBITAL Performed at [facility], [address], [place], [place] [address]    Special Requests   Final    BOTTLES DRAWN AEROBIC AND ANAEROBIC Blood Culture results may not be optimal due to an inadequate volume of blood received in culture bottles Performed at [facility], [address], [place], [place] [address]    Culture   Final    NO GROWTH 3 DAYS Performed at [facility], [address], [place], [place] [address]    Report Status PENDING  Incomplete  Resp panel by RT-PCR (RSV, Flu A&B, Covid) Anterior Nasal Swab     Status: None   Collection Time: 09/10/23  2:59 PM   Specimen: Anterior Nasal Swab  Result Value Ref Range Status   SARS Coronavirus 2 by RT PCR NEGATIVE NEGATIVE Final    Comment: (NOTE) SARS-CoV-2 target nucleic acids are NOT DETECTED.  The SARS-CoV-2 RNA is generally detectable in upper respiratory specimens during the acute phase of infection. The lowest concentration of SARS-CoV-2 viral copies this assay can detect is 138 copies/mL. A negative result does not preclude SARS-Cov-2 infection and should not be used as the sole basis for treatment or other patient management decisions. A negative result may occur with  improper specimen collection/handling, submission of specimen other than nasopharyngeal swab, presence of viral mutation(s) within the areas targeted by this assay, and inadequate number of viral copies(<138 copies/mL). A negative result must be combined with clinical observations, patient history, and epidemiological information. The expected result is Negative.  Fact Sheet for Patients:  bloggercourse.com  Fact Sheet for Healthcare Providers:   seriousbroker.it  This test is no t yet approved or cleared by the [place]  FDA and  has been authorized for detection and/or diagnosis of SARS-CoV-2 by FDA under an Emergency Use Authorization (  EUA). This EUA will remain  in effect (meaning this test can be used) for the duration of the COVID-19 declaration under Section 564(b)(1) of the Act, 21 U.S.C.section 360bbb-3(b)(1), unless the authorization is terminated  or revoked sooner.       Influenza A by PCR NEGATIVE NEGATIVE Final   Influenza B by PCR NEGATIVE NEGATIVE Final    Comment: (NOTE) The Xpert Xpress SARS-CoV-2/FLU/RSV plus assay is intended as an aid in the diagnosis of influenza from Nasopharyngeal swab specimens and should not be used as a sole basis for treatment. Nasal washings and aspirates are unacceptable for Xpert Xpress SARS-CoV-2/FLU/RSV testing.  Fact Sheet for Patients: bloggercourse.com  Fact Sheet for Healthcare Providers: seriousbroker.it  This test is not yet approved or cleared by the [place]  FDA and has been authorized for detection and/or diagnosis of SARS-CoV-2 by FDA under an Emergency Use Authorization (EUA). This EUA will remain in effect (meaning this test can be used) for the duration of the COVID-19 declaration under Section 564(b)(1) of the Act, 21 U.S.C. section 360bbb-3(b)(1), unless the authorization is terminated or revoked.     Resp Syncytial Virus by PCR NEGATIVE NEGATIVE Final    Comment: (NOTE) Fact Sheet for Patients: bloggercourse.com  Fact Sheet for Healthcare Providers: seriousbroker.it  This test is not yet approved or cleared by the [place]  FDA and has been authorized for detection and/or diagnosis of SARS-CoV-2 by FDA under an Emergency Use Authorization (EUA). This EUA will remain in effect (meaning this test can be used) for  the duration of the COVID-19 declaration under Section 564(b)(1) of the Act, 21 U.S.C. section 360bbb-3(b)(1), unless the authorization is terminated or revoked.  Performed at [facility], [address] [place], [place] [address]   MRSA Next Gen by PCR, Nasal     Status: Abnormal   Collection Time: 09/11/23 12:13 PM   Specimen: Nasal Mucosa; Nasal Swab  Result Value Ref Range Status   MRSA by PCR Next Gen DETECTED (A) NOT DETECTED Final    Comment: RESULT CALLED TO, READ BACK BY AND VERIFIED WITH: RICHDERSON, B. RN AT 1402 ON 09/11/2023 BY MECIAL J. (NOTE) The GeneXpert MRSA Assay (FDA approved for NASAL specimens only), is one component of a comprehensive MRSA colonization surveillance program. It is not intended to diagnose MRSA infection nor to guide or monitor treatment for MRSA infections. Test performance is not FDA approved in patients less than 8 years old. Performed at [facility], [address], [place], [place] [address]       Radiology Studies: ECHOCARDIOGRAM COMPLETE Result Date: 09/12/2023    ECHOCARDIOGRAM REPORT   Patient Name:   STANLEY HELMUTH Udovich Date of Exam: 09/12/2023 Medical Rec #:  [id number]      Height:       62.5 in Accession #:    [id number]     Weight:       292.0 lb Date of Birth:  1961/02/19     BSA:          2.259 m Patient Age:    62 years       BP:           114/67 mmHg Patient Gender: F              HR:           108 bpm. Exam Location:  Inpatient Procedure: 2D Echo, Cardiac Doppler, Color Doppler and Intracardiac  Opacification Agent Indications:    CHF- Acute Diastolic  History:        Patient has prior history of Echocardiogram examinations, most                 recent 05/29/2021. CAD; Risk Factors:Diabetes and Former Smoker.  Sonographer:    Ozell Free Referring Phys: [id number] JENNIFER CHOI  Sonographer Comments: Technically challenging study due to limited acoustic windows and patient is obese.  IMPRESSIONS  1. Left ventricular ejection fraction, by estimation, is 60 to 65%. The left ventricle has normal function. Left ventricular endocardial border not optimally defined to evaluate regional wall motion. Left ventricular diastolic parameters are indeterminate.  2. RV not well visualized, grossly appears normal in size and function. . Right ventricular systolic function was not well visualized. The right ventricular size is not well visualized. Tricuspid regurgitation signal is inadequate for assessing PA pressure.  3. The mitral valve was not well visualized. No evidence of mitral valve regurgitation. No evidence of mitral stenosis.  4. The aortic valve was not well visualized. Aortic valve regurgitation is not visualized. No aortic stenosis is present.  5. Technically difficult study, poor visualization. FINDINGS  Left Ventricle: Left ventricular ejection fraction, by estimation, is 60 to 65%. The left ventricle has normal function. Left ventricular endocardial border not optimally defined to evaluate regional wall motion. Definity  contrast agent was given IV to delineate the left ventricular endocardial borders. The left ventricular internal cavity size was normal in size. There is no left ventricular hypertrophy. Left ventricular diastolic parameters are indeterminate. Right Ventricle: RV not well visualized, grossly appears normal in size and function. The right ventricular size is not well visualized. Right vetricular wall thickness was not well visualized. Right ventricular systolic function was not well visualized.  Tricuspid regurgitation signal is inadequate for assessing PA pressure. Left Atrium: Left atrial size was not well visualized. Right Atrium: Right atrial size was not well visualized. Pericardium: The pericardium was not well visualized. Mitral Valve: The mitral valve was not well visualized. No evidence of mitral valve regurgitation. No evidence of mitral valve stenosis. Tricuspid Valve:  The tricuspid valve is not well visualized. Tricuspid valve regurgitation is not demonstrated. No evidence of tricuspid stenosis. Aortic Valve: The aortic valve was not well visualized. Aortic valve regurgitation is not visualized. No aortic stenosis is present. Aortic valve mean gradient measures 8.0 mmHg. Aortic valve peak gradient measures 16.3 mmHg. Pulmonic Valve: The pulmonic valve was not well visualized. Pulmonic valve regurgitation is not visualized. No evidence of pulmonic stenosis. Aorta: The aortic root was not well visualized. IAS/Shunts: The interatrial septum was not well visualized.   Diastology LV e' medial:    6.53 cm/s LV E/e' medial:  20.4 LV e' lateral:   7.83 cm/s LV E/e' lateral: 17.0  RIGHT VENTRICLE RV Basal diam:  4.00 cm RV S prime:     11.30 cm/s TAPSE (M-mode): 1.7 cm LEFT ATRIUM             Index        RIGHT ATRIUM           Index LA Vol (A2C):   75.3 ml 33.34 ml/m  RA Area:     21.60 cm LA Vol (A4C):   80.2 ml 35.51 ml/m  RA Volume:   76.40 ml  33.83 ml/m LA Biplane Vol: 81.0 ml 35.86 ml/m  AORTIC VALVE AV Vmax:           202.00 cm/s AV Vmean:  129.000 cm/s AV VTI:            0.309 m AV Peak Grad:      16.3 mmHg AV Mean Grad:      8.0 mmHg LVOT Vmax:         92.30 cm/s LVOT Vmean:        58.700 cm/s LVOT VTI:          0.148 m LVOT/AV VTI ratio: 0.48 MITRAL VALVE MV Area (PHT): 5.88 cm     SHUNTS MV Decel Time: 129 msec     Systemic VTI: 0.15 m MV E velocity: 133.00 cm/s MV A velocity: 94.30 cm/s MV E/A ratio:  1.41 Dorn Ross MD Electronically signed by Dorn Ross MD Signature Date/Time: 09/12/2023/2:40:08 PM    Final    DG CHEST PORT 1 VIEW Result Date: 09/11/2023 CLINICAL DATA:  Short of breath EXAM: PORTABLE CHEST 1 VIEW COMPARISON:  09/10/2023 FINDINGS: Single frontal view of the chest demonstrates an enlarged cardiac silhouette. Increased pulmonary vascular congestion and diffuse interstitial prominence consistent with congestive heart failure and  interstitial edema. Patchy consolidation at the right lung base may reflect asymmetric edema or atelectasis. No pneumothorax. No acute bony abnormalities. IMPRESSION: 1. Findings consistent with mild to moderate congestive heart failure. Electronically Signed   By: Ozell Daring M.D.   On: 09/11/2023 19:37      Scheduled Meds:  aspirin  EC  81 mg Oral Daily   Chlorhexidine  Gluconate Cloth  6 each Topical Q0600   enoxaparin  (LOVENOX ) injection  60 mg Subcutaneous Q24H   ferrous sulfate   325 mg Oral BID WC   fluticasone   1 spray Each Nare Daily   furosemide   40 mg Intravenous BID   gabapentin   400 mg Oral BID   gabapentin   800 mg Oral QHS   Gerhardt's butt cream   Topical BID   insulin  aspart  0-20 Units Subcutaneous TID WC   insulin  aspart  0-5 Units Subcutaneous QHS   insulin  regular human CONCENTRATED  5 Units Subcutaneous TID WC   isosorbide  mononitrate  60 mg Oral Daily   levalbuterol   0.63 mg Nebulization Q6H   levothyroxine   200 mcg Oral QAC breakfast   mupirocin  ointment  1 Application Nasal BID   mupirocin  ointment   Topical BID   nystatin    Topical TID   pantoprazole   40 mg Oral Daily   potassium chloride   40 mEq Oral Daily   pramipexole   1 mg Oral TID   pregabalin   50 mg Oral TID   sodium chloride  flush  3 mL Intravenous Q12H   Continuous Infusions:  cefTRIAXone  (ROCEPHIN )  IV 2 g (09/12/23 1448)     LOS: 3 days     Lebron JINNY Cage, MD [facility] 09/13/2023, 1:32 PM   Available via Epic secure chat 7am-7pm After these hours, please refer to coverage provider listed on [email or web address]

## 2023-09-13 NOTE — Progress Notes (Signed)
 Physical Therapy Treatment Patient Details Name: [name] MRN: [id number] DOB: Jul 19, 1961 Today's Date: 09/13/2023   History of Present Illness [name] is a 63 y.o.presented 09/10/23 from PCP for worsening Right breast cellulitis, LLE cellulitis. PMH: morbid obesity, right AKA, irritable bowel syndrome, hypertension, hyperlipidemia, hypothyroidism, type 2 diabetes mellitus, rheumatoid arthritis, asthma    PT Comments  The patient is tearful, stating that the bari air  bed is not allowing her to sit up straight to assist better breathing and better  position to eat. She also states that she has been able to sit propped on BED side partially with LLE over bed edge when on the regular hospital bed.  Patient does meet criteria for the air mattress with  skin breakdown but not  allowing for comfort. The bari air mattress bed also is not conducive to attempted stand/pivot transfers due to mattress and bed height.  Patient noted to be eating with HOB not more than 45*. Unable to adjust the  [facility]  any farther to  sit patient more upright. Assisted to place 2 more pillows at  patient back to sit up straighter to finish eating.  Patient declines SNF.  Patient was mod I for transfers PTA, home alone when spouse works.   If plan is discharge home, recommend the following: Two people to help with walking and/or transfers;Two people to help with bathing/dressing/bathroom   Can travel by private vehicle     No  Equipment Recommendations  None recommended by PT    Recommendations for Other Services       Precautions / Restrictions Precautions Precautions: Fall Precaution Comments: R AKA Restrictions Weight Bearing Restrictions Per Provider Order: No     Mobility  Bed Mobility               General bed mobility comments: max assist to  pull self forward to palce 2 more pillows behind back in order to sit upright to eat. UNable to safely sit on the bed side of the bari air  mattress.    Transfers                   General transfer comment: NT    Ambulation/Gait                   Stairs             Wheelchair Mobility     Tilt Bed    Modified Rankin (Stroke Patients Only)       Balance       Sitting balance - Comments: unable to  dangle                                    Cognition Arousal: Alert Behavior During Therapy: Lability Overall Cognitive Status: Within Functional Limits for tasks assessed                                 General Comments: patientn labile , stating now that she is on the  bari air, she cannot sit up straight  to eat and breathe, cannot sit on bed side like she did oin regular bed where she can turn to right side, stated she  sat up on bed edge and ate dinner with spouse  last evening(before switch to air mattress)  Exercises      General Comments        Pertinent Vitals/Pain Pain Assessment Pain Location: right shoulder, L knee Pain Descriptors / Indicators: Cramping, Discomfort, Guarding, Grimacing Pain Intervention(s): Monitored during session    Home Living                          Prior Function            PT Goals (current goals can now be found in the care plan section) Progress towards PT goals: Progressing toward goals    Frequency    Min 1X/week      PT Plan      Co-evaluation              AM-PAC PT 6 Clicks Mobility   Outcome Measure  Help needed turning from your back to your side while in a flat bed without using bedrails?: Total Help needed moving from lying on your back to sitting on the side of a flat bed without using bedrails?: Total Help needed moving to and from a bed to a chair (including a wheelchair)?: Total Help needed standing up from a chair using your arms (e.g., wheelchair or bedside chair)?: Total Help needed to walk in hospital room?: Total Help needed climbing 3-5 steps with a  railing? : Total 6 Click Score: 6    End of Session   Activity Tolerance: Other (comment) (limited due to positioning in bed) Patient left: in bed;with call bell/phone within reach Nurse Communication: Mobility status;Need for lift equipment (need a different bed that patient can  mobilize in) Pain - Right/Left: Right Pain - part of body: Shoulder     Time: 1400-1430 PT Time Calculation (min) (ACUTE ONLY): 30 min  Charges:    $Therapeutic Activity: 23-37 mins PT General Charges $$ ACUTE PT VISIT: 1 Visit                     Darice Potters PT Acute Rehabilitation Services Office [phone] Weekend pager-[phone]    Potters Darice Norris 09/13/2023, 3:14 PM

## 2023-09-13 NOTE — Plan of Care (Signed)
   Problem: Fluid Volume: Goal: Hemodynamic stability will improve Outcome: Progressing   Problem: Clinical Measurements: Goal: Diagnostic test results will improve Outcome: Progressing Goal: Signs and symptoms of infection will decrease Outcome: Progressing   Problem: Respiratory: Goal: Ability to maintain adequate ventilation will improve Outcome: Progressing

## 2023-09-13 NOTE — TOC Progression Note (Signed)
 Transition of Care [facility]) - Progression Note    Patient Details  Name: [name] MRN: [id number] Date of Birth: Apr 13, 1961  Transition of Care [facility]) CM/SW Contact  Alfonse JONELLE Rex, RN Phone Number: 09/13/2023, 3:27 PM  Clinical Narrative:  PT recommendation for short term rehab/SNF, pt declined, agreeable to [facility] PT/OT [facility] [place], [place],  [facility] , [place], [place], ) declined. [facility] to verify insurance active, await determination.     Expected Discharge Plan: Home w Home Health Services Barriers to Discharge: Continued Medical Work up  Expected Discharge Plan and Services       Living arrangements for the past 2 months: Single Family Home                                       Social Determinants of Health (SDOH) Interventions SDOH Screenings   Food Insecurity: No Food Insecurity (09/11/2023)  Housing: High Risk (09/12/2023)  Transportation Needs: Unmet Transportation Needs (09/11/2023)  Utilities: Not At Risk (09/11/2023)  Alcohol  Screen: Low Risk  (09/10/2023)  Depression (PHQ2-9): Low Risk  (07/10/2023)  Financial Resource Strain: Medium Risk (09/10/2023)  Physical Activity: Unknown (09/10/2023)  Social Connections: Socially Isolated (09/10/2023)  Stress: No Stress Concern Present (09/10/2023)  Tobacco Use: Medium Risk (09/10/2023)    Readmission Risk Interventions    12/23/2021    4:00 PM  Readmission Risk Prevention Plan  Transportation Screening Complete  Medication Review (RN Care Manager) Complete  PCP or Specialist appointment within 3-5 days of discharge Complete  HRI or Home Care Consult Complete  SW Recovery Care/Counseling Consult Complete  Palliative Care Screening Not Applicable  Skilled Nursing Facility Not Applicable

## 2023-09-14 ENCOUNTER — Inpatient Hospital Stay (HOSPITAL_COMMUNITY): Payer: Commercial Managed Care - HMO

## 2023-09-14 DIAGNOSIS — L03313 Cellulitis of chest wall: Secondary | ICD-10-CM | POA: Diagnosis not present

## 2023-09-14 LAB — CBC WITH DIFFERENTIAL/PLATELET
Abs Immature Granulocytes: 0.05 10*3/uL (ref 0.00–0.07)
Basophils Absolute: 0.1 10*3/uL (ref 0.0–0.1)
Basophils Relative: 1 %
Eosinophils Absolute: 0.2 10*3/uL (ref 0.0–0.5)
Eosinophils Relative: 3 %
HCT: 30.8 % — ABNORMAL LOW (ref 36.0–46.0)
Hemoglobin: 8 g/dL — ABNORMAL LOW (ref 12.0–15.0)
Immature Granulocytes: 1 %
Lymphocytes Relative: 15 %
Lymphs Abs: 1.3 10*3/uL (ref 0.7–4.0)
MCH: 17.8 pg — ABNORMAL LOW (ref 26.0–34.0)
MCHC: 26 g/dL — ABNORMAL LOW (ref 30.0–36.0)
MCV: 68.6 fL — ABNORMAL LOW (ref 80.0–100.0)
Monocytes Absolute: 1.1 10*3/uL — ABNORMAL HIGH (ref 0.1–1.0)
Monocytes Relative: 13 %
Neutro Abs: 5.8 10*3/uL (ref 1.7–7.7)
Neutrophils Relative %: 67 %
Platelets: 289 10*3/uL (ref 150–400)
RBC: 4.49 MIL/uL (ref 3.87–5.11)
RDW: 24.6 % — ABNORMAL HIGH (ref 11.5–15.5)
WBC: 8.5 10*3/uL (ref 4.0–10.5)
nRBC: 0.2 % (ref 0.0–0.2)

## 2023-09-14 LAB — BASIC METABOLIC PANEL
Anion gap: 10 (ref 5–15)
BUN: 10 mg/dL (ref 8–23)
CO2: 34 mmol/L — ABNORMAL HIGH (ref 22–32)
Calcium: 8.2 mg/dL — ABNORMAL LOW (ref 8.9–10.3)
Chloride: 92 mmol/L — ABNORMAL LOW (ref 98–111)
Creatinine, Ser: 0.73 mg/dL (ref 0.44–1.00)
GFR, Estimated: >60 mL/min (ref >60)
Glucose, Bld: 172 mg/dL — ABNORMAL HIGH (ref 70–99)
Potassium: 3.4 mmol/L — ABNORMAL LOW (ref 3.5–5.1)
Sodium: 136 mmol/L (ref 135–145)

## 2023-09-14 LAB — MAGNESIUM: Magnesium: 1.8 mg/dL (ref 1.7–2.4)

## 2023-09-14 LAB — GLUCOSE, CAPILLARY
Glucose-Capillary: 168 mg/dL — ABNORMAL HIGH (ref 70–99)
Glucose-Capillary: 223 mg/dL — ABNORMAL HIGH (ref 70–99)
Glucose-Capillary: 193 mg/dL — ABNORMAL HIGH (ref 70–99)
Glucose-Capillary: 191 mg/dL — ABNORMAL HIGH (ref 70–99)

## 2023-09-14 MED ORDER — ALPRAZOLAM 0.25 MG PO TABS
0.2500 mg | ORAL_TABLET | Freq: Once | ORAL | Status: AC
  Administered 2023-09-14: 0.25 mg via ORAL
  Filled 2023-09-14: qty 1

## 2023-09-14 MED ORDER — ARFORMOTEROL TARTRATE 15 MCG/2ML IN NEBU
15.0000 ug | INHALATION_SOLUTION | Freq: Two times a day (BID) | RESPIRATORY_TRACT | Status: DC
  Administered 2023-09-14 – 2023-10-01 (×34): 15 ug via RESPIRATORY_TRACT
  Filled 2023-09-14 (×34): qty 2

## 2023-09-14 MED ORDER — ENOXAPARIN SODIUM 80 MG/0.8ML IJ SOSY
80.0000 mg | PREFILLED_SYRINGE | INTRAMUSCULAR | Status: DC
  Administered 2023-09-15 – 2023-09-19 (×5): 80 mg via SUBCUTANEOUS
  Filled 2023-09-14 (×5): qty 0.8

## 2023-09-14 MED ORDER — GUAIFENESIN ER 600 MG PO TB12
600.0000 mg | ORAL_TABLET | Freq: Two times a day (BID) | ORAL | Status: DC
  Administered 2023-09-14 – 2023-10-01 (×33): 600 mg via ORAL
  Filled 2023-09-14 (×33): qty 1

## 2023-09-14 MED ORDER — BUDESONIDE 0.25 MG/2ML IN SUSP
0.2500 mg | Freq: Two times a day (BID) | RESPIRATORY_TRACT | Status: DC
  Administered 2023-09-14 – 2023-10-01 (×34): 0.25 mg via RESPIRATORY_TRACT
  Filled 2023-09-14 (×34): qty 2

## 2023-09-14 MED ORDER — DILTIAZEM HCL ER COATED BEADS 120 MG PO CP24
120.0000 mg | ORAL_CAPSULE | Freq: Every day | ORAL | Status: DC
  Administered 2023-09-14: 120 mg via ORAL
  Filled 2023-09-14: qty 1

## 2023-09-14 MED ORDER — POTASSIUM CHLORIDE CRYS ER 20 MEQ PO TBCR
40.0000 meq | EXTENDED_RELEASE_TABLET | Freq: Two times a day (BID) | ORAL | Status: AC
  Administered 2023-09-14 – 2023-09-15 (×4): 40 meq via ORAL
  Filled 2023-09-14 (×4): qty 2

## 2023-09-14 MED ORDER — SODIUM CHLORIDE (PF) 0.9 % IJ SOLN
INTRAMUSCULAR | Status: AC
  Filled 2023-09-14: qty 50

## 2023-09-14 MED ORDER — IOHEXOL 350 MG/ML SOLN
100.0000 mL | Freq: Once | INTRAVENOUS | Status: AC | PRN
  Administered 2023-09-14: 100 mL via INTRAVENOUS

## 2023-09-14 NOTE — TOC Progression Note (Signed)
 Transition of Care [facility]) - Progression Note    Patient Details  Name: [name] MRN: [id number] Date of Birth: 1961/08/09  Transition of Care [facility]) CM/SW Contact  Alfonse JONELLE Rex, RN Phone Number: 09/14/2023, 12:55 PM  Clinical Narrative:  Leopoldo, rep-Amy, checking on insurance coverage. TOC will continue to follow.      Expected Discharge Plan: Home w Home Health Services Barriers to Discharge: Continued Medical Work up  Expected Discharge Plan and Services       Living arrangements for the past 2 months: Single Family Home                                       Social Determinants of Health (SDOH) Interventions SDOH Screenings   Food Insecurity: No Food Insecurity (09/11/2023)  Housing: High Risk (09/12/2023)  Transportation Needs: Unmet Transportation Needs (09/11/2023)  Utilities: Not At Risk (09/11/2023)  Alcohol  Screen: Low Risk  (09/10/2023)  Depression (PHQ2-9): Low Risk  (07/10/2023)  Financial Resource Strain: Medium Risk (09/10/2023)  Physical Activity: Unknown (09/10/2023)  Social Connections: Socially Isolated (09/10/2023)  Stress: No Stress Concern Present (09/10/2023)  Tobacco Use: Medium Risk (09/10/2023)    Readmission Risk Interventions    12/23/2021    4:00 PM  Readmission Risk Prevention Plan  Transportation Screening Complete  Medication Review (RN Care Manager) Complete  PCP or Specialist appointment within 3-5 days of discharge Complete  HRI or Home Care Consult Complete  SW Recovery Care/Counseling Consult Complete  Palliative Care Screening Not Applicable  Skilled Nursing Facility Not Applicable

## 2023-09-14 NOTE — Plan of Care (Signed)
  Problem: Education: Goal: Knowledge of General Education information will improve Description: Including pain rating scale, medication(s)/side effects and non-pharmacologic comfort measures Outcome: Progressing   Problem: Nutrition: Goal: Adequate nutrition will be maintained Outcome: Progressing   Problem: Coping: Goal: Level of anxiety will decrease Outcome: Progressing   Problem: Elimination: Goal: Will not experience complications related to urinary retention Outcome: Progressing   Problem: Pain Management: Goal: General experience of comfort will improve Outcome: Progressing   Problem: Safety: Goal: Ability to remain free from injury will improve Outcome: Progressing   Problem: Skin Integrity: Goal: Risk for impaired skin integrity will decrease Outcome: Progressing

## 2023-09-14 NOTE — Progress Notes (Signed)
 PROGRESS NOTE    [name]  FMW:[id number] DOB: [birth year] DOA: 09/10/2023 PCP: [name], DO     Brief Narrative:  [name] is a 63 y.o. female with medical history significant of morbid obesity, right AKA, irritable bowel syndrome, HTN, HLD, DM, RA, asthma who presented to ED at her PCPs recommendation with worsening cellulitis of right breast.  Reportedly, patient initially presented to ED on 09/03/2023 and was diagnosed with left lower extremity as well as cellulitis under right breast and was discharged home on cefadroxil .  Patient continued to take medications as prescribed however her cellulitis continued to get worse.  She was seen by her PCP via virtual visit and was advised to come to the emergency department.  She was started on vancomycin .  New events last 24 hours / Subjective: Reports some improvement overall, still short of breath.  Patient denies any chest pain, abdominal pain, nausea/vomiting, fever/chills.  Questionable possible OSA/obesity hypoventilation syndrome.  Discussed about trial of CPAP at night of which patient is agreeable.    Assessment & Plan:   Principal Problem:   Cellulitis Active Problems:   Chronic diastolic CHF (congestive heart failure) (HCC)   Type 2 diabetes mellitus with hyperglycemia, with long-term current use of insulin  (HCC)   CAD S/P percutaneous coronary angioplasty   Iron  deficiency anemia   Mixed connective tissue disease (HCC)   Gastroesophageal reflux disease   Morbid obesity (HCC)   Sepsis (HCC)   Sepsis secondary to right breast cellulitis Sepsis present on admission with tachycardia, tachypnea Blood cultures NGTD Nonpurulent in nature, significant edema Switched Vancomycin  --> Rocephin  Nystatin  powder for fungal infection  Multiple chronic wounds including abdomen, left breast, left lower extremity, labia Appreciate wound RN recommendations/wound care Antibiotics as above  Possible acute on  chronic diastolic HF Appears overloaded BNP 106, falsely low in morbidly obese patient Chest x-ray with mild to moderate CHF Echo with EF of 60 to 65%, left ventricular diastolic parameters are indeterminate Continue diuresis with IV Lasix  twice daily Strict I's and O's, daily weights Daily BMP while diuresing Supplemental O2 as needed  Hypokalemia Replace as needed  History of asthma/?COPD Patient reports history of asthma Started on Pulmicort , Brovana  Xopenex  ordered, stated Dulera  never worked for her and insist on having albuterol  as needed inhaler Supplemental O2 as needed  Acute on chronic hypoxic respiratory failure Currently requiring about 3-5L of O2 Elevated D-dimer, CTA chest negative for acute PE, noted possible atelectasis, cardiomegaly Management as above  CAD Aspirin   Hypertension Started PTA diltiazem  at a lower dose as BP soft while diuresing, plan to increase pending BP Held Imdur  for now  Diabetes mellitus Continue Humulin  R, sliding scale insulin   Iron  deficiency anemia Microcytic anemia Anemia panel showed: Iron  13, TIBC 536, sats 2.4, ferritin 5.3 Folate-->30, vitamin B12-->672 Received 1 dose of IV iron  on 09/13/2023 Continue ferrous sulfate   Hypothyroidism Euthyroid sick syndrome Synthroid  Repeat TSH as outpatient in 4 to 6 weeks  Chronic pain Oxycodone , gabapentin   Possible OSA/obesity hypoventilation syndrome Trial of CPAP at bedtime Supplemental O2   DVT prophylaxis: Lovenox    Code Status: Full code Family Communication: None Disposition Plan: Home with home health, declined SNF Status is: Inpatient Remains inpatient appropriate because: Level of care    Antimicrobials:  Anti-infectives (From admission, onward)    Start     Dose/Rate Route Frequency Ordered Stop   09/11/23 1800  vancomycin  (VANCOCIN ) 1,750 mg in sodium chloride  0.9 % 500 mL IVPB  Status:  Discontinued        1,750 mg 258.8 mL/hr over 120 Minutes  Intravenous Every 24 hours 09/10/23 2052 09/11/23 1309   09/11/23 1400  cefTRIAXone  (ROCEPHIN ) 2 g in sodium chloride  0.9 % 100 mL IVPB        2 g 200 mL/hr over 30 Minutes Intravenous Every 24 hours 09/11/23 1309 09/18/23 1359   09/11/23 0015  fluconazole  (DIFLUCAN ) tablet 100 mg  Status:  Discontinued        100 mg Oral Daily 09/10/23 2005 09/11/23 0948   09/10/23 2000  vancomycin  (VANCOCIN ) IVPB 1000 mg/200 mL premix  Status:  Discontinued        1,000 mg 200 mL/hr over 60 Minutes Intravenous  Once 09/10/23 1958 09/11/23 0005   09/10/23 1415  cefTRIAXone  (ROCEPHIN ) 2 g in sodium chloride  0.9 % 100 mL IVPB        2 g 200 mL/hr over 30 Minutes Intravenous  Once 09/10/23 1406 09/10/23 1518   09/10/23 1415  vancomycin  (VANCOCIN ) IVPB 1000 mg/200 mL premix        1,000 mg 200 mL/hr over 60 Minutes Intravenous Every 1 hr x 2 09/10/23 1408 09/10/23 1923        Objective: Vitals:   09/14/23 1026 09/14/23 1033 09/14/23 1229 09/14/23 1239  BP: 120/63  (!) 140/65   Pulse: 98  (!) 104   Resp:   18   Temp:   99.2 F (37.3 C)   TempSrc:   Oral   SpO2:   98% 96%  Weight:  (!) 150.7 kg    Height:        Intake/Output Summary (Last 24 hours) at 09/14/2023 1642 Last data filed at 09/14/2023 1016 Gross per 24 hour  Intake 440 ml  Output 500 ml  Net -60 ml   Filed Weights   09/10/23 1321 09/13/23 0746 09/14/23 1033  Weight: 132.5 kg (!) 157.5 kg (!) 150.7 kg    Examination:  General: NAD  Cardiovascular: S1, S2 present Respiratory: Diminished breath sounds bilaterally Abdomen: Soft, nontender, distended, bowel sounds present Musculoskeletal: Noted right AKA  Skin: Multiple wounds noted, right breast with swelling/erythema/induration Psychiatry: Normal mood    Data Reviewed: I have personally reviewed following labs and imaging studies  CBC: Recent Labs  Lab 09/10/23 1434 09/11/23 0417 09/12/23 0448 09/13/23 0424 09/14/23 0414  WBC 10.1 9.5 8.7 9.3 8.5  NEUTROABS 7.0  --    --  5.9 5.8  HGB 8.2* 7.9* 7.9* 7.9* 8.0*  HCT 32.1* 31.5* 30.9* 30.4* 30.8*  MCV 67.6* 70.2* 67.9* 68.2* 68.6*  PLT 324 280 297 308 289   Basic Metabolic Panel: Recent Labs  Lab 09/10/23 1434 09/10/23 2047 09/11/23 0417 09/12/23 0755 09/13/23 0424 09/13/23 1353 09/14/23 0414 09/14/23 0843  NA 140  --  129* 136 137  --  136  --   K 4.6  --  4.3 4.0 3.5  --  3.4*  --   CL 104  --  97* 98 97*  --  92*  --   CO2 22  --  23 30 32  --  34*  --   GLUCOSE 180*  --  241* 218* 184*  --  172*  --   BUN 13  --  10 10 10   --  10  --   CREATININE 0.80  --  0.70 0.85 0.81  --  0.73  --   CALCIUM  9.1  --  8.1* 8.4* 8.4*  --  8.2*  --  MG  --  2.0  --   --   --  1.8  --  1.8   GFR: Estimated Creatinine Clearance: 104.9 mL/min (by C-G formula based on SCr of 0.73 mg/dL). Liver Function Tests: Recent Labs  Lab 09/10/23 1434 09/11/23 0417  AST 32 27  ALT 23 22  ALKPHOS 88 80  BILITOT 0.6 0.6  PROT 7.2 6.5  ALBUMIN 3.2* 2.8*   No results for input(s): LIPASE, AMYLASE in the last 168 hours. No results for input(s): AMMONIA in the last 168 hours. Coagulation Profile: Recent Labs  Lab 09/11/23 0417  INR 1.2   Cardiac Enzymes: Recent Labs  Lab 09/10/23 1434  CKTOTAL 121   BNP (last 3 results) No results for input(s): PROBNP in the last 8760 hours. HbA1C: No results for input(s): HGBA1C in the last 72 hours.  CBG: Recent Labs  Lab 09/13/23 1618 09/13/23 2158 09/14/23 0736 09/14/23 1152 09/14/23 1633  GLUCAP 207* 245* 168* 223* 193*   Lipid Profile: No results for input(s): CHOL, HDL, LDLCALC, TRIG, CHOLHDL, LDLDIRECT in the last 72 hours. Thyroid  Function Tests: No results for input(s): TSH, T4TOTAL, FREET4, T3FREE, THYROIDAB in the last 72 hours.  Anemia Panel: Recent Labs    09/13/23 1353  VITAMINB12 672  FOLATE 30.2   Sepsis Labs: No results for input(s): PROCALCITON, LATICACIDVEN in the last 168 hours.  Recent  Results (from the past 240 hours)  Blood culture (routine x 2)     Status: None (Preliminary result)   Collection Time: 09/10/23  2:32 PM   Specimen: BLOOD  Result Value Ref Range Status   Specimen Description   Final    BLOOD RIGHT ANTECUBITAL Performed at [facility], [address] [place], [place] [address]    Special Requests   Final    BOTTLES DRAWN AEROBIC AND ANAEROBIC Blood Culture results may not be optimal due to an inadequate volume of blood received in culture bottles Performed at [facility], [address], [place], [place] [address]    Culture   Final    NO GROWTH 4 DAYS Performed at [facility], [address] [place], [place] [address]    Report Status PENDING  Incomplete  Blood culture (routine x 2)     Status: None (Preliminary result)   Collection Time: 09/10/23  2:34 PM   Specimen: BLOOD  Result Value Ref Range Status   Specimen Description   Final    BLOOD LEFT ANTECUBITAL Performed at [facility], [address] [place], [place] [address]    Special Requests   Final    BOTTLES DRAWN AEROBIC AND ANAEROBIC Blood Culture results may not be optimal due to an inadequate volume of blood received in culture bottles Performed at [facility], [address] [place], [place] [address]    Culture   Final    NO GROWTH 4 DAYS Performed at [facility], [address] [place], [place] [address]    Report Status PENDING  Incomplete  Resp panel by RT-PCR (RSV, Flu A&B, Covid) Anterior Nasal Swab     Status: None   Collection Time: 09/10/23  2:59 PM   Specimen: Anterior Nasal Swab  Result Value Ref Range Status   SARS Coronavirus 2 by RT PCR NEGATIVE NEGATIVE Final    Comment: (NOTE) SARS-CoV-2 target nucleic acids are NOT DETECTED.  The SARS-CoV-2 RNA is generally detectable in upper respiratory specimens during the acute phase  of infection. The lowest concentration  of SARS-CoV-2 viral copies this assay can detect is 138 copies/mL. A negative result does not preclude SARS-Cov-2 infection and should not be used as the sole basis for treatment or other patient management decisions. A negative result may occur with  improper specimen collection/handling, submission of specimen other than nasopharyngeal swab, presence of viral mutation(s) within the areas targeted by this assay, and inadequate number of viral copies(<138 copies/mL). A negative result must be combined with clinical observations, patient history, and epidemiological information. The expected result is Negative.  Fact Sheet for Patients:  bloggercourse.com  Fact Sheet for Healthcare Providers:  seriousbroker.it  This test is no t yet approved or cleared by the [place]  FDA and  has been authorized for detection and/or diagnosis of SARS-CoV-2 by FDA under an Emergency Use Authorization (EUA). This EUA will remain  in effect (meaning this test can be used) for the duration of the COVID-19 declaration under Section 564(b)(1) of the Act, 21 U.S.C.section 360bbb-3(b)(1), unless the authorization is terminated  or revoked sooner.       Influenza A by PCR NEGATIVE NEGATIVE Final   Influenza B by PCR NEGATIVE NEGATIVE Final    Comment: (NOTE) The Xpert Xpress SARS-CoV-2/FLU/RSV plus assay is intended as an aid in the diagnosis of influenza from Nasopharyngeal swab specimens and should not be used as a sole basis for treatment. Nasal washings and aspirates are unacceptable for Xpert Xpress SARS-CoV-2/FLU/RSV testing.  Fact Sheet for Patients: bloggercourse.com  Fact Sheet for Healthcare Providers: seriousbroker.it  This test is not yet approved or cleared by the [place]  FDA and has been authorized for detection and/or diagnosis of SARS-CoV-2 by FDA under an Emergency Use  Authorization (EUA). This EUA will remain in effect (meaning this test can be used) for the duration of the COVID-19 declaration under Section 564(b)(1) of the Act, 21 U.S.C. section 360bbb-3(b)(1), unless the authorization is terminated or revoked.     Resp Syncytial Virus by PCR NEGATIVE NEGATIVE Final    Comment: (NOTE) Fact Sheet for Patients: bloggercourse.com  Fact Sheet for Healthcare Providers: seriousbroker.it  This test is not yet approved or cleared by the [place]  FDA and has been authorized for detection and/or diagnosis of SARS-CoV-2 by FDA under an Emergency Use Authorization (EUA). This EUA will remain in effect (meaning this test can be used) for the duration of the COVID-19 declaration under Section 564(b)(1) of the Act, 21 U.S.C. section 360bbb-3(b)(1), unless the authorization is terminated or revoked.  Performed at [facility], [address], [place], [place] [address]   MRSA Next Gen by PCR, Nasal     Status: Abnormal   Collection Time: 09/11/23 12:13 PM   Specimen: Nasal Mucosa; Nasal Swab  Result Value Ref Range Status   MRSA by PCR Next Gen DETECTED (A) NOT DETECTED Final    Comment: RESULT CALLED TO, READ BACK BY AND VERIFIED WITH: RICHDERSON, B. RN AT 1402 ON 09/11/2023 BY MECIAL J. (NOTE) The GeneXpert MRSA Assay (FDA approved for NASAL specimens only), is one component of a comprehensive MRSA colonization surveillance program. It is not intended to diagnose MRSA infection nor to guide or monitor treatment for MRSA infections. Test performance is not FDA approved in patients less than 17 years old. Performed at [facility], [address], [place], [place] [address]       Radiology Studies: CT Angio Chest Pulmonary Embolism (PE) W or WO Contrast Result Date: 09/14/2023 CLINICAL DATA:  Shortness of breath and wheezing cellulitis under right breast  elevated D-dimer EXAM: CT ANGIOGRAPHY CHEST WITH CONTRAST TECHNIQUE: Multidetector CT imaging of the chest was performed using the standard protocol during bolus administration of intravenous contrast. Multiplanar CT image reconstructions and MIPs were obtained to evaluate the vascular anatomy. RADIATION DOSE REDUCTION: This exam was performed according to the departmental dose-optimization program which includes automated exposure control, adjustment of the mA and/or kV according to patient size and/or use of iterative reconstruction technique. CONTRAST:  OMNIPAQUE  IOHEXOL  350 MG/ML SOLN COMPARISON:  CT chest 09/10/2023 FINDINGS: Cardiovascular: Satisfactory opacification of the pulmonary arteries to the segmental level. No evidence of pulmonary embolism. Mild aortic atherosclerosis. No aneurysm. Coronary vascular calcification. Cardiomegaly. No pericardial effusion Mediastinum/Nodes: Patent trachea. No thyroid  mass. Multiple subcentimeter mediastinal lymph nodes. AP window lymph nodes up to 11 mm. Esophagus within normal limits. Lungs/Pleura: Small right-sided pleural effusion. Partial right lower lobe consolidation, atelectasis favored over pneumonia. Partial atelectasis at the left base as well. Upper Abdomen: No acute finding. Musculoskeletal: Asymmetrical edema and stranding incompletely visualized within the right breast/chest wall and right abdominal wall. Review of the MIP images confirms the above findings. IMPRESSION: 1. Negative for acute pulmonary embolus. 2. Small right-sided pleural effusion with partial right lower lobe consolidation, atelectasis favored over pneumonia. Partial atelectasis at the left base as well. 3. Cardiomegaly. 4. Asymmetrical edema and stranding incompletely visualized within the right breast/chest wall and right abdominal wall, presumably corresponding to the history of cellulitis. Dedicated palpation right breast imaging should be performed after resolution of acute  symptoms. 5. Aortic atherosclerosis. Aortic Atherosclerosis (ICD10-I70.0). Electronically Signed   By: Luke Bun M.D.   On: 09/14/2023 01:12      Scheduled Meds:  aspirin  EC  81 mg Oral Daily   Chlorhexidine  Gluconate Cloth  6 each Topical Q0600   chlorpheniramine-HYDROcodone   5 mL Oral Once   diltiazem   120 mg Oral Daily   [START ON 09/15/2023] enoxaparin  (LOVENOX ) injection  80 mg Subcutaneous Q24H   ferrous sulfate   325 mg Oral BID WC   fluticasone   1 spray Each Nare Daily   furosemide   40 mg Intravenous BID   gabapentin   400 mg Oral BID   gabapentin   800 mg Oral QHS   Gerhardt's butt cream   Topical BID   insulin  aspart  0-20 Units Subcutaneous TID WC   insulin  aspart  0-5 Units Subcutaneous QHS   insulin  regular human CONCENTRATED  5 Units Subcutaneous TID WC   levalbuterol   0.63 mg Nebulization TID   levothyroxine   200 mcg Oral QAC breakfast   mupirocin  ointment  1 Application Nasal BID   mupirocin  ointment   Topical BID   nystatin    Topical TID   pantoprazole   40 mg Oral Daily   potassium chloride   40 mEq Oral BID   pramipexole   1 mg Oral TID   pregabalin   50 mg Oral TID   sodium chloride  flush  3 mL Intravenous Q12H   Continuous Infusions:  cefTRIAXone  (ROCEPHIN )  IV 2 g (09/14/23 1457)     LOS: 4 days     Lebron JINNY Cage, MD [facility] 09/14/2023, 4:42 PM   Available via Epic secure chat 7am-7pm After these hours, please refer to coverage provider listed on [email or web address]

## 2023-09-15 DIAGNOSIS — L03313 Cellulitis of chest wall: Secondary | ICD-10-CM | POA: Diagnosis not present

## 2023-09-15 LAB — CBC WITH DIFFERENTIAL/PLATELET
Abs Immature Granulocytes: 0.04 10*3/uL (ref 0.00–0.07)
Basophils Absolute: 0.1 10*3/uL (ref 0.0–0.1)
Basophils Relative: 1 %
Eosinophils Absolute: 0.3 10*3/uL (ref 0.0–0.5)
Eosinophils Relative: 3 %
HCT: 32.3 % — ABNORMAL LOW (ref 36.0–46.0)
Hemoglobin: 8.1 g/dL — ABNORMAL LOW (ref 12.0–15.0)
Immature Granulocytes: 1 %
Lymphocytes Relative: 18 %
Lymphs Abs: 1.5 10*3/uL (ref 0.7–4.0)
MCH: 17.8 pg — ABNORMAL LOW (ref 26.0–34.0)
MCHC: 25.1 g/dL — ABNORMAL LOW (ref 30.0–36.0)
MCV: 71.1 fL — ABNORMAL LOW (ref 80.0–100.0)
Monocytes Absolute: 1.3 10*3/uL — ABNORMAL HIGH (ref 0.1–1.0)
Monocytes Relative: 16 %
Neutro Abs: 5 10*3/uL (ref 1.7–7.7)
Neutrophils Relative %: 61 %
Platelets: 253 10*3/uL (ref 150–400)
RBC: 4.54 MIL/uL (ref 3.87–5.11)
RDW: 25.2 % — ABNORMAL HIGH (ref 11.5–15.5)
WBC: 8.2 10*3/uL (ref 4.0–10.5)
nRBC: 0.2 % (ref 0.0–0.2)

## 2023-09-15 LAB — CULTURE, BLOOD (ROUTINE X 2)
Culture: NO GROWTH
Culture: NO GROWTH

## 2023-09-15 LAB — GLUCOSE, CAPILLARY
Glucose-Capillary: 187 mg/dL — ABNORMAL HIGH (ref 70–99)
Glucose-Capillary: 178 mg/dL — ABNORMAL HIGH (ref 70–99)
Glucose-Capillary: 242 mg/dL — ABNORMAL HIGH (ref 70–99)
Glucose-Capillary: 185 mg/dL — ABNORMAL HIGH (ref 70–99)

## 2023-09-15 LAB — BASIC METABOLIC PANEL
Anion gap: 12 (ref 5–15)
BUN: 12 mg/dL (ref 8–23)
CO2: 34 mmol/L — ABNORMAL HIGH (ref 22–32)
Calcium: 8.2 mg/dL — ABNORMAL LOW (ref 8.9–10.3)
Chloride: 90 mmol/L — ABNORMAL LOW (ref 98–111)
Creatinine, Ser: 0.88 mg/dL (ref 0.44–1.00)
GFR, Estimated: >60 mL/min (ref >60)
Glucose, Bld: 194 mg/dL — ABNORMAL HIGH (ref 70–99)
Potassium: 3.9 mmol/L (ref 3.5–5.1)
Sodium: 136 mmol/L (ref 135–145)

## 2023-09-15 MED ORDER — SALINE SPRAY 0.65 % NA SOLN
1.0000 | NASAL | Status: DC | PRN
  Administered 2023-09-15 – 2023-09-29 (×3): 1 via NASAL
  Filled 2023-09-15: qty 44

## 2023-09-15 MED ORDER — DILTIAZEM HCL ER COATED BEADS 180 MG PO CP24
180.0000 mg | ORAL_CAPSULE | Freq: Every day | ORAL | Status: DC
  Administered 2023-09-15: 180 mg via ORAL
  Filled 2023-09-15: qty 1

## 2023-09-15 NOTE — H&P (Addendum)
 ADM: CELLULITIS HOME REGIMEN: ALBUTEROL  NEBS PRN, ALBUTEROL  INHALER PRN CURRENT THERAPY: BROVANA  BID, PULMICORT  BID, XOPENEX  TID, O2, CPAP at bedtime, ALBUTEROL  Q4 PRN PLAN: BROVANA  BID, PULNICORT BID, ALBUTEROL  PRN, O2, CPAP at bedtime ASSESSMENT: NO WOB, NO SOB, 2 LPM [place], BS-DIM, NPC X-RAY: 09/11/23 CHF RT CHANGED THERAPY BASED ON PT CONDITION.

## 2023-09-15 NOTE — Progress Notes (Signed)
 PROGRESS NOTE    [name]  FMW:[id number] DOB: 08-04-1961 DOA: 09/10/2023 PCP: [name], DO     Brief Narrative:  [name] is a 63 y.o. female with medical history significant of morbid obesity, right AKA, irritable bowel syndrome, HTN, HLD, DM, RA, asthma who presented to ED at her PCPs recommendation with worsening cellulitis of right breast.  Reportedly, patient initially presented to ED on 09/03/2023 and was diagnosed with left lower extremity as well as cellulitis under right breast and was discharged home on cefadroxil .  Patient continued to take medications as prescribed however her cellulitis continued to get worse.  She was seen by her PCP via virtual visit and was advised to come to the emergency department.  She was started on vancomycin .  New events last 24 hours / Subjective: Denies any new complaints. Still needing IV diuresis    Assessment & Plan:   Principal Problem:   Cellulitis Active Problems:   Chronic diastolic CHF (congestive heart failure) (HCC)   Type 2 diabetes mellitus with hyperglycemia, with long-term current use of insulin  (HCC)   CAD S/P percutaneous coronary angioplasty   Iron  deficiency anemia   Mixed connective tissue disease (HCC)   Gastroesophageal reflux disease   Morbid obesity (HCC)   Sepsis (HCC)   Sepsis secondary to right breast cellulitis Sepsis present on admission with tachycardia, tachypnea Blood cultures NGTD Nonpurulent in nature, significant edema Switched Vancomycin  --> Rocephin  Nystatin  powder for fungal infection  Multiple chronic wounds including abdomen, left breast, left lower extremity, labia Appreciate wound RN recommendations/wound care Antibiotics as above  Possible acute on chronic diastolic HF Appears overloaded BNP 106, falsely low in morbidly obese patient Chest x-ray with mild to moderate CHF Echo with EF of 60 to 65%, left ventricular diastolic parameters are  indeterminate Continue diuresis with IV Lasix  twice daily Strict I's and O's, daily weights Daily BMP while diuresing Supplemental O2 as needed  Hypokalemia Replace as needed  History of asthma/?COPD Patient reports history of asthma Started on Pulmicort , Brovana  Xopenex  ordered, stated Dulera  never worked for her and insist on having albuterol  as needed inhaler Supplemental O2 as needed  Acute on chronic hypoxic respiratory failure Currently requiring about 2L of O2 Elevated D-dimer, CTA chest negative for acute PE, noted possible atelectasis, cardiomegaly Management as above  CAD Aspirin   Hypertension Started PTA diltiazem  at a lower dose as BP soft while diuresing, plan to increase pending BP Held Imdur  for now  Diabetes mellitus Continue Humulin  R, sliding scale insulin   Iron  deficiency anemia Microcytic anemia Anemia panel showed: Iron  13, TIBC 536, sats 2.4, ferritin 5.3 Folate-->30, vitamin B12-->672 Received 1 dose of IV iron  on 09/13/2023 Continue ferrous sulfate   Hypothyroidism Euthyroid sick syndrome Synthroid  Repeat TSH as outpatient in 4 to 6 weeks  Chronic pain Oxycodone , gabapentin   Possible OSA/obesity hypoventilation syndrome Trial of CPAP at bedtime Supplemental O2   DVT prophylaxis: Lovenox    Code Status: Full code Family Communication: None Disposition Plan: Home with home health, declined SNF Status is: Inpatient Remains inpatient appropriate because: Level of care    Antimicrobials:  Anti-infectives (From admission, onward)    Start     Dose/Rate Route Frequency Ordered Stop   09/11/23 1800  vancomycin  (VANCOCIN ) 1,750 mg in sodium chloride  0.9 % 500 mL IVPB  Status:  Discontinued        1,750 mg 258.8 mL/hr over 120 Minutes Intravenous Every 24 hours 09/10/23 2052 09/11/23 1309   09/11/23 1400  cefTRIAXone  (  ROCEPHIN ) 2 g in sodium chloride  0.9 % 100 mL IVPB        2 g 200 mL/hr over 30 Minutes Intravenous Every 24 hours  09/11/23 1309 09/18/23 1359   09/11/23 0015  fluconazole  (DIFLUCAN ) tablet 100 mg  Status:  Discontinued        100 mg Oral Daily 09/10/23 2005 09/11/23 0948   09/10/23 2000  vancomycin  (VANCOCIN ) IVPB 1000 mg/200 mL premix  Status:  Discontinued        1,000 mg 200 mL/hr over 60 Minutes Intravenous  Once 09/10/23 1958 09/11/23 0005   09/10/23 1415  cefTRIAXone  (ROCEPHIN ) 2 g in sodium chloride  0.9 % 100 mL IVPB        2 g 200 mL/hr over 30 Minutes Intravenous  Once 09/10/23 1406 09/10/23 1518   09/10/23 1415  vancomycin  (VANCOCIN ) IVPB 1000 mg/200 mL premix        1,000 mg 200 mL/hr over 60 Minutes Intravenous Every 1 hr x 2 09/10/23 1408 09/10/23 1923        Objective: Vitals:   09/15/23 0453 09/15/23 0500 09/15/23 0855 09/15/23 1415  BP: 131/70   119/65  Pulse: (!) 110   (!) 106  Resp: 20     Temp: 98.5 F (36.9 C)   99 F (37.2 C)  TempSrc: Oral   Oral  SpO2: 93%  96% 92%  Weight:  (!) 151.4 kg    Height:        Intake/Output Summary (Last 24 hours) at 09/15/2023 1603 Last data filed at 09/15/2023 1415 Gross per 24 hour  Intake 1060 ml  Output 4375 ml  Net -3315 ml   Filed Weights   09/13/23 0746 09/14/23 1033 09/15/23 0500  Weight: (!) 157.5 kg (!) 150.7 kg (!) 151.4 kg    Examination:  General: NAD  Cardiovascular: S1, S2 present Respiratory: Diminished breath sounds bilaterally Abdomen: Soft, nontender, distended, bowel sounds present Musculoskeletal: Noted right AKA  Skin: Multiple wounds noted, right breast with swelling/erythema/induration Psychiatry: Normal mood    Data Reviewed: I have personally reviewed following labs and imaging studies  CBC: Recent Labs  Lab 09/10/23 1434 09/11/23 0417 09/12/23 0448 09/13/23 0424 09/14/23 0414 09/15/23 0445  WBC 10.1 9.5 8.7 9.3 8.5 8.2  NEUTROABS 7.0  --   --  5.9 5.8 5.0  HGB 8.2* 7.9* 7.9* 7.9* 8.0* 8.1*  HCT 32.1* 31.5* 30.9* 30.4* 30.8* 32.3*  MCV 67.6* 70.2* 67.9* 68.2* 68.6* 71.1*  PLT 324 280  297 308 289 253   Basic Metabolic Panel: Recent Labs  Lab 09/10/23 2047 09/11/23 0417 09/12/23 0755 09/13/23 0424 09/13/23 1353 09/14/23 0414 09/14/23 0843 09/15/23 0445  NA  --  129* 136 137  --  136  --  136  K  --  4.3 4.0 3.5  --  3.4*  --  3.9  CL  --  97* 98 97*  --  92*  --  90*  CO2  --  23 30 32  --  34*  --  34*  GLUCOSE  --  241* 218* 184*  --  172*  --  194*  BUN  --  10 10 10   --  10  --  12  CREATININE  --  0.70 0.85 0.81  --  0.73  --  0.88  CALCIUM   --  8.1* 8.4* 8.4*  --  8.2*  --  8.2*  MG 2.0  --   --   --  1.8  --  1.8  --    GFR: Estimated Creatinine Clearance: 95.5 mL/min (by C-G formula based on SCr of 0.88 mg/dL). Liver Function Tests: Recent Labs  Lab 09/10/23 1434 09/11/23 0417  AST 32 27  ALT 23 22  ALKPHOS 88 80  BILITOT 0.6 0.6  PROT 7.2 6.5  ALBUMIN 3.2* 2.8*   No results for input(s): LIPASE, AMYLASE in the last 168 hours. No results for input(s): AMMONIA in the last 168 hours. Coagulation Profile: Recent Labs  Lab 09/11/23 0417  INR 1.2   Cardiac Enzymes: Recent Labs  Lab 09/10/23 1434  CKTOTAL 121   BNP (last 3 results) No results for input(s): PROBNP in the last 8760 hours. HbA1C: No results for input(s): HGBA1C in the last 72 hours.  CBG: Recent Labs  Lab 09/14/23 1152 09/14/23 1633 09/14/23 2214 09/15/23 0738 09/15/23 1133  GLUCAP 223* 193* 191* 187* 178*   Lipid Profile: No results for input(s): CHOL, HDL, LDLCALC, TRIG, CHOLHDL, LDLDIRECT in the last 72 hours. Thyroid  Function Tests: No results for input(s): TSH, T4TOTAL, FREET4, T3FREE, THYROIDAB in the last 72 hours.  Anemia Panel: Recent Labs    09/13/23 1353  VITAMINB12 672  FOLATE 30.2   Sepsis Labs: No results for input(s): PROCALCITON, LATICACIDVEN in the last 168 hours.  Recent Results (from the past 240 hours)  Blood culture (routine x 2)     Status: None   Collection Time: 09/10/23  2:32 PM    Specimen: BLOOD  Result Value Ref Range Status   Specimen Description   Final    BLOOD RIGHT ANTECUBITAL Performed at [facility], [address] [place], [place] [address]    Special Requests   Final    BOTTLES DRAWN AEROBIC AND ANAEROBIC Blood Culture results may not be optimal due to an inadequate volume of blood received in culture bottles Performed at [facility], [address], [place], [place] [address]    Culture   Final    NO GROWTH 5 DAYS Performed at [facility], [address] [place], [place] [address]    Report Status 09/15/2023 FINAL  Final  Blood culture (routine x 2)     Status: None   Collection Time: 09/10/23  2:34 PM   Specimen: BLOOD  Result Value Ref Range Status   Specimen Description   Final    BLOOD LEFT ANTECUBITAL Performed at [facility], [address], [place], [place] [address]    Special Requests   Final    BOTTLES DRAWN AEROBIC AND ANAEROBIC Blood Culture results may not be optimal due to an inadequate volume of blood received in culture bottles Performed at [facility], [address] [place], [place] [address]    Culture   Final    NO GROWTH 5 DAYS Performed at [facility], [address] [place], [place] [address]    Report Status 09/15/2023 FINAL  Final  Resp panel by RT-PCR (RSV, Flu A&B, Covid) Anterior Nasal Swab     Status: None   Collection Time: 09/10/23  2:59 PM   Specimen: Anterior Nasal Swab  Result Value Ref Range Status   SARS Coronavirus 2 by RT PCR NEGATIVE NEGATIVE Final    Comment: (NOTE) SARS-CoV-2 target nucleic acids are NOT DETECTED.  The SARS-CoV-2 RNA is generally detectable in upper respiratory specimens during the acute phase of infection. The lowest concentration of SARS-CoV-2 viral copies this assay can detect is 138 copies/mL. A  negative result does not preclude SARS-Cov-2 infection and should not be used as the  sole basis for treatment or other patient management decisions. A negative result may occur with  improper specimen collection/handling, submission of specimen other than nasopharyngeal swab, presence of viral mutation(s) within the areas targeted by this assay, and inadequate number of viral copies(<138 copies/mL). A negative result must be combined with clinical observations, patient history, and epidemiological information. The expected result is Negative.  Fact Sheet for Patients:  bloggercourse.com  Fact Sheet for Healthcare Providers:  seriousbroker.it  This test is no t yet approved or cleared by the [place]  FDA and  has been authorized for detection and/or diagnosis of SARS-CoV-2 by FDA under an Emergency Use Authorization (EUA). This EUA will remain  in effect (meaning this test can be used) for the duration of the COVID-19 declaration under Section 564(b)(1) of the Act, 21 U.S.C.section 360bbb-3(b)(1), unless the authorization is terminated  or revoked sooner.       Influenza A by PCR NEGATIVE NEGATIVE Final   Influenza B by PCR NEGATIVE NEGATIVE Final    Comment: (NOTE) The Xpert Xpress SARS-CoV-2/FLU/RSV plus assay is intended as an aid in the diagnosis of influenza from Nasopharyngeal swab specimens and should not be used as a sole basis for treatment. Nasal washings and aspirates are unacceptable for Xpert Xpress SARS-CoV-2/FLU/RSV testing.  Fact Sheet for Patients: bloggercourse.com  Fact Sheet for Healthcare Providers: seriousbroker.it  This test is not yet approved or cleared by the [place]  FDA and has been authorized for detection and/or diagnosis of SARS-CoV-2 by FDA under an Emergency Use Authorization (EUA). This EUA will remain in effect (meaning this test can be used) for the duration of the COVID-19 declaration under Section 564(b)(1) of the  Act, 21 U.S.C. section 360bbb-3(b)(1), unless the authorization is terminated or revoked.     Resp Syncytial Virus by PCR NEGATIVE NEGATIVE Final    Comment: (NOTE) Fact Sheet for Patients: bloggercourse.com  Fact Sheet for Healthcare Providers: seriousbroker.it  This test is not yet approved or cleared by the [place]  FDA and has been authorized for detection and/or diagnosis of SARS-CoV-2 by FDA under an Emergency Use Authorization (EUA). This EUA will remain in effect (meaning this test can be used) for the duration of the COVID-19 declaration under Section 564(b)(1) of the Act, 21 U.S.C. section 360bbb-3(b)(1), unless the authorization is terminated or revoked.  Performed at [facility], [address], [place], [place] [address]   MRSA Next Gen by PCR, Nasal     Status: Abnormal   Collection Time: 09/11/23 12:13 PM   Specimen: Nasal Mucosa; Nasal Swab  Result Value Ref Range Status   MRSA by PCR Next Gen DETECTED (A) NOT DETECTED Final    Comment: RESULT CALLED TO, READ BACK BY AND VERIFIED WITH: RICHDERSON, B. RN AT 1402 ON 09/11/2023 BY MECIAL J. (NOTE) The GeneXpert MRSA Assay (FDA approved for NASAL specimens only), is one component of a comprehensive MRSA colonization surveillance program. It is not intended to diagnose MRSA infection nor to guide or monitor treatment for MRSA infections. Test performance is not FDA approved in patients less than 20 years old. Performed at [facility], [address] [place], [place] [address]       Radiology Studies: CT Angio Chest Pulmonary Embolism (PE) W or WO Contrast Result Date: 09/14/2023 CLINICAL DATA:  Shortness of breath and wheezing cellulitis under right breast elevated D-dimer EXAM: CT ANGIOGRAPHY CHEST WITH  CONTRAST TECHNIQUE: Multidetector CT imaging of the chest was performed using the standard protocol during bolus  administration of intravenous contrast. Multiplanar CT image reconstructions and MIPs were obtained to evaluate the vascular anatomy. RADIATION DOSE REDUCTION: This exam was performed according to the departmental dose-optimization program which includes automated exposure control, adjustment of the mA and/or kV according to patient size and/or use of iterative reconstruction technique. CONTRAST:  OMNIPAQUE  IOHEXOL  350 MG/ML SOLN COMPARISON:  CT chest 09/10/2023 FINDINGS: Cardiovascular: Satisfactory opacification of the pulmonary arteries to the segmental level. No evidence of pulmonary embolism. Mild aortic atherosclerosis. No aneurysm. Coronary vascular calcification. Cardiomegaly. No pericardial effusion Mediastinum/Nodes: Patent trachea. No thyroid  mass. Multiple subcentimeter mediastinal lymph nodes. AP window lymph nodes up to 11 mm. Esophagus within normal limits. Lungs/Pleura: Small right-sided pleural effusion. Partial right lower lobe consolidation, atelectasis favored over pneumonia. Partial atelectasis at the left base as well. Upper Abdomen: No acute finding. Musculoskeletal: Asymmetrical edema and stranding incompletely visualized within the right breast/chest wall and right abdominal wall. Review of the MIP images confirms the above findings. IMPRESSION: 1. Negative for acute pulmonary embolus. 2. Small right-sided pleural effusion with partial right lower lobe consolidation, atelectasis favored over pneumonia. Partial atelectasis at the left base as well. 3. Cardiomegaly. 4. Asymmetrical edema and stranding incompletely visualized within the right breast/chest wall and right abdominal wall, presumably corresponding to the history of cellulitis. Dedicated palpation right breast imaging should be performed after resolution of acute symptoms. 5. Aortic atherosclerosis. Aortic Atherosclerosis (ICD10-I70.0). Electronically Signed   By: Luke Bun M.D.   On: 09/14/2023 01:12      Scheduled  Meds:  arformoterol   15 mcg Nebulization BID   aspirin  EC  81 mg Oral Daily   budesonide  (PULMICORT ) nebulizer solution  0.25 mg Nebulization BID   Chlorhexidine  Gluconate Cloth  6 each Topical Q0600   diltiazem   180 mg Oral Daily   enoxaparin  (LOVENOX ) injection  80 mg Subcutaneous Q24H   ferrous sulfate   325 mg Oral BID WC   fluticasone   1 spray Each Nare Daily   furosemide   40 mg Intravenous BID   gabapentin   400 mg Oral BID   gabapentin   800 mg Oral QHS   Gerhardt's butt cream   Topical BID   guaiFENesin   600 mg Oral BID   insulin  aspart  0-20 Units Subcutaneous TID WC   insulin  aspart  0-5 Units Subcutaneous QHS   insulin  regular human CONCENTRATED  5 Units Subcutaneous TID WC   levothyroxine   200 mcg Oral QAC breakfast   mupirocin  ointment  1 Application Nasal BID   mupirocin  ointment   Topical BID   nystatin    Topical TID   pantoprazole   40 mg Oral Daily   potassium chloride   40 mEq Oral BID   pramipexole   1 mg Oral TID   pregabalin   50 mg Oral TID   sodium chloride  flush  3 mL Intravenous Q12H   Continuous Infusions:  cefTRIAXone  (ROCEPHIN )  IV 2 g (09/15/23 1428)     LOS: 5 days     Lebron JINNY Cage, MD [facility] 09/15/2023, 4:03 PM   Available via Epic secure chat 7am-7pm After these hours, please refer to coverage provider listed on [email or web address]

## 2023-09-16 ENCOUNTER — Inpatient Hospital Stay (HOSPITAL_COMMUNITY): Payer: Commercial Managed Care - HMO

## 2023-09-16 DIAGNOSIS — L03313 Cellulitis of chest wall: Secondary | ICD-10-CM | POA: Diagnosis not present

## 2023-09-16 LAB — BASIC METABOLIC PANEL WITH GFR
Anion gap: 11 (ref 5–15)
BUN: 13 mg/dL (ref 8–23)
CO2: 36 mmol/L — ABNORMAL HIGH (ref 22–32)
Calcium: 8.3 mg/dL — ABNORMAL LOW (ref 8.9–10.3)
Chloride: 90 mmol/L — ABNORMAL LOW (ref 98–111)
Creatinine, Ser: 0.71 mg/dL (ref 0.44–1.00)
GFR, Estimated: >60 mL/min (ref >60)
Glucose, Bld: 199 mg/dL — ABNORMAL HIGH (ref 70–99)
Potassium: 4 mmol/L (ref 3.5–5.1)
Sodium: 137 mmol/L (ref 135–145)

## 2023-09-16 LAB — GLUCOSE, CAPILLARY
Glucose-Capillary: 205 mg/dL — ABNORMAL HIGH (ref 70–99)
Glucose-Capillary: 210 mg/dL — ABNORMAL HIGH (ref 70–99)
Glucose-Capillary: 228 mg/dL — ABNORMAL HIGH (ref 70–99)
Glucose-Capillary: 174 mg/dL — ABNORMAL HIGH (ref 70–99)

## 2023-09-16 MED ORDER — CHLORHEXIDINE GLUCONATE CLOTH 2 % EX PADS
6.0000 | MEDICATED_PAD | Freq: Every day | CUTANEOUS | Status: DC
Start: 2023-09-16 — End: 2023-10-01
  Administered 2023-09-16 – 2023-10-01 (×15): 6 via TOPICAL

## 2023-09-16 MED ORDER — DILTIAZEM HCL ER COATED BEADS 240 MG PO CP24
240.0000 mg | ORAL_CAPSULE | Freq: Every day | ORAL | Status: DC
  Administered 2023-09-16 – 2023-09-19 (×4): 240 mg via ORAL
  Filled 2023-09-16 (×4): qty 1

## 2023-09-16 NOTE — Plan of Care (Signed)
  Problem: Fluid Volume: Goal: Hemodynamic stability will improve Outcome: Progressing   Problem: Respiratory: Goal: Ability to maintain adequate ventilation will improve Outcome: Progressing   Problem: Education: Goal: Knowledge of General Education information will improve Description: Including pain rating scale, medication(s)/side effects and non-pharmacologic comfort measures Outcome: Progressing   Problem: Activity: Goal: Risk for activity intolerance will decrease Outcome: Progressing   Problem: Pain Management: Goal: General experience of comfort will improve Outcome: Progressing   Problem: Safety: Goal: Ability to remain free from injury will improve Outcome: Progressing

## 2023-09-16 NOTE — Progress Notes (Signed)
 PROGRESS NOTE    Jacqui Headen Boot  FMW:[id number] DOB: 28-Oct-1960 DOA: 09/10/2023 PCP: [name], DO     Brief Narrative:  Sia Gabrielsen Hacking is a 63 y.o. female with medical history significant of morbid obesity, right AKA, irritable bowel syndrome, HTN, HLD, DM, RA, asthma who presented to ED at her PCPs recommendation with worsening cellulitis of right breast.  Reportedly, patient initially presented to ED on 09/03/2023 and was diagnosed with left lower extremity as well as cellulitis under right breast and was discharged home on cefadroxil .  Patient continued to take medications as prescribed however her cellulitis continued to get worse.  She was seen by her PCP via virtual visit and was advised to come to the emergency department.  She was started on vancomycin .  New events last 24 hours / Subjective: Reports some right wrist pain.  X-ray pending.  Still requiring IV diuresis    Assessment & Plan:   Principal Problem:   Cellulitis Active Problems:   Chronic diastolic CHF (congestive heart failure) (HCC)   Type 2 diabetes mellitus with hyperglycemia, with long-term current use of insulin  (HCC)   CAD S/P percutaneous coronary angioplasty   Iron  deficiency anemia   Mixed connective tissue disease (HCC)   Gastroesophageal reflux disease   Morbid obesity (HCC)   Sepsis (HCC)   Sepsis secondary to right breast cellulitis Sepsis present on admission with tachycardia, tachypnea Blood cultures NGTD Nonpurulent in nature, significant edema Switched Vancomycin  --> Rocephin  Nystatin  powder for fungal infection  Multiple chronic wounds including abdomen, left breast, left lower extremity, labia Appreciate wound RN recommendations/wound care Antibiotics as above  Possible acute on chronic diastolic HF Appears overloaded BNP 106, falsely low in morbidly obese patient Chest x-ray with mild to moderate CHF Echo with EF of 60 to 65%, left ventricular diastolic parameters  are indeterminate Continue diuresis with IV Lasix  twice daily Strict I's and O's, daily weights Daily BMP while diuresing Supplemental O2 as needed  Hypokalemia Replace as needed  History of asthma/?COPD Patient reports history of asthma Started on Pulmicort , Brovana  Xopenex  ordered, stated Dulera  never worked for her and insist on having albuterol  as needed inhaler Supplemental O2 as needed  Acute on chronic hypoxic respiratory failure Currently requiring about 2L of O2 Elevated D-dimer, CTA chest negative for acute PE, noted possible atelectasis, cardiomegaly Management as above  CAD Aspirin   Hypertension Started PTA diltiazem  at a lower dose as BP soft while diuresing, plan to increase pending BP Held Imdur  for now  Diabetes mellitus Continue Humulin  R, sliding scale insulin   Iron  deficiency anemia Microcytic anemia Anemia panel showed: Iron  13, TIBC 536, sats 2.4, ferritin 5.3 Folate-->30, vitamin B12-->672 Received 1 dose of IV iron  on 09/13/2023 Continue ferrous sulfate   Hypothyroidism Euthyroid sick syndrome Synthroid  Repeat TSH as outpatient in 4 to 6 weeks  Chronic pain Oxycodone , gabapentin   Possible OSA/obesity hypoventilation syndrome Trial of CPAP at bedtime Supplemental O2   DVT prophylaxis: Lovenox    Code Status: Full code Family Communication: None Disposition Plan: Home with home health, declined SNF Status is: Inpatient Remains inpatient appropriate because: Level of care    Antimicrobials:  Anti-infectives (From admission, onward)    Start     Dose/Rate Route Frequency Ordered Stop   09/11/23 1800  vancomycin  (VANCOCIN ) 1,750 mg in sodium chloride  0.9 % 500 mL IVPB  Status:  Discontinued        1,750 mg 258.8 mL/hr over 120 Minutes Intravenous Every 24 hours 09/10/23 2052 09/11/23 1309  09/11/23 1400  cefTRIAXone  (ROCEPHIN ) 2 g in sodium chloride  0.9 % 100 mL IVPB        2 g 200 mL/hr over 30 Minutes Intravenous Every 24 hours  09/11/23 1309 09/18/23 1359   09/11/23 0015  fluconazole  (DIFLUCAN ) tablet 100 mg  Status:  Discontinued        100 mg Oral Daily 09/10/23 2005 09/11/23 0948   09/10/23 2000  vancomycin  (VANCOCIN ) IVPB 1000 mg/200 mL premix  Status:  Discontinued        1,000 mg 200 mL/hr over 60 Minutes Intravenous  Once 09/10/23 1958 09/11/23 0005   09/10/23 1415  cefTRIAXone  (ROCEPHIN ) 2 g in sodium chloride  0.9 % 100 mL IVPB        2 g 200 mL/hr over 30 Minutes Intravenous  Once 09/10/23 1406 09/10/23 1518   09/10/23 1415  vancomycin  (VANCOCIN ) IVPB 1000 mg/200 mL premix        1,000 mg 200 mL/hr over 60 Minutes Intravenous Every 1 hr x 2 09/10/23 1408 09/10/23 1923        Objective: Vitals:   09/16/23 0500 09/16/23 0755 09/16/23 0758 09/16/23 1125  BP:    131/75  Pulse:    (!) 105  Resp:      Temp:    98.7 F (37.1 C)  TempSrc:    Oral  SpO2:  94% 94% 99%  Weight: (!) 148.3 kg     Height:        Intake/Output Summary (Last 24 hours) at 09/16/2023 1512 Last data filed at 09/16/2023 1350 Gross per 24 hour  Intake --  Output 2650 ml  Net -2650 ml   Filed Weights   09/14/23 1033 09/15/23 0500 09/16/23 0500  Weight: (!) 150.7 kg (!) 151.4 kg (!) 148.3 kg    Examination:  General: NAD  Cardiovascular: S1, S2 present Respiratory: Diminished breath sounds bilaterally Abdomen: Soft, nontender, distended, bowel sounds present Musculoskeletal: Noted right AKA  Skin: Multiple wounds noted, right breast with swelling/erythema/induration Psychiatry: Normal mood    Data Reviewed: I have personally reviewed following labs and imaging studies  CBC: Recent Labs  Lab 09/10/23 1434 09/11/23 0417 09/12/23 0448 09/13/23 0424 09/14/23 0414 09/15/23 0445  WBC 10.1 9.5 8.7 9.3 8.5 8.2  NEUTROABS 7.0  --   --  5.9 5.8 5.0  HGB 8.2* 7.9* 7.9* 7.9* 8.0* 8.1*  HCT 32.1* 31.5* 30.9* 30.4* 30.8* 32.3*  MCV 67.6* 70.2* 67.9* 68.2* 68.6* 71.1*  PLT 324 280 297 308 289 253   Basic Metabolic  Panel: Recent Labs  Lab 09/10/23 2047 09/11/23 0417 09/12/23 0755 09/13/23 0424 09/13/23 1353 09/14/23 0414 09/14/23 0843 09/15/23 0445 09/16/23 0437  NA  --    < > 136 137  --  136  --  136 137  K  --    < > 4.0 3.5  --  3.4*  --  3.9 4.0  CL  --    < > 98 97*  --  92*  --  90* 90*  CO2  --    < > 30 32  --  34*  --  34* 36*  GLUCOSE  --    < > 218* 184*  --  172*  --  194* 199*  BUN  --    < > 10 10  --  10  --  12 13  CREATININE  --    < > 0.85 0.81  --  0.73  --  0.88 0.71  CALCIUM   --    < >  8.4* 8.4*  --  8.2*  --  8.2* 8.3*  MG 2.0  --   --   --  1.8  --  1.8  --   --    < > = values in this interval not displayed.   GFR: Estimated Creatinine Clearance: 103.7 mL/min (by C-G formula based on SCr of 0.71 mg/dL). Liver Function Tests: Recent Labs  Lab 09/10/23 1434 09/11/23 0417  AST 32 27  ALT 23 22  ALKPHOS 88 80  BILITOT 0.6 0.6  PROT 7.2 6.5  ALBUMIN 3.2* 2.8*   No results for input(s): LIPASE, AMYLASE in the last 168 hours. No results for input(s): AMMONIA in the last 168 hours. Coagulation Profile: Recent Labs  Lab 09/11/23 0417  INR 1.2   Cardiac Enzymes: Recent Labs  Lab 09/10/23 1434  CKTOTAL 121   BNP (last 3 results) No results for input(s): PROBNP in the last 8760 hours. HbA1C: No results for input(s): HGBA1C in the last 72 hours.  CBG: Recent Labs  Lab 09/15/23 1133 09/15/23 1648 09/15/23 2110 09/16/23 0751 09/16/23 1119  GLUCAP 178* 242* 185* 205* 210*   Lipid Profile: No results for input(s): CHOL, HDL, LDLCALC, TRIG, CHOLHDL, LDLDIRECT in the last 72 hours. Thyroid  Function Tests: No results for input(s): TSH, T4TOTAL, FREET4, T3FREE, THYROIDAB in the last 72 hours.  Anemia Panel: No results for input(s): VITAMINB12, FOLATE, FERRITIN, TIBC, IRON , RETICCTPCT in the last 72 hours.  Sepsis Labs: No results for input(s): PROCALCITON, LATICACIDVEN in the last 168 hours.  Recent  Results (from the past 240 hours)  Blood culture (routine x 2)     Status: None   Collection Time: 09/10/23  2:32 PM   Specimen: BLOOD  Result Value Ref Range Status   Specimen Description   Final    BLOOD RIGHT ANTECUBITAL Performed at [facility], [address], [place], [place] [address]    Special Requests   Final    BOTTLES DRAWN AEROBIC AND ANAEROBIC Blood Culture results may not be optimal due to an inadequate volume of blood received in culture bottles Performed at [facility], [address], [place], [place] [address]    Culture   Final    NO GROWTH 5 DAYS Performed at [facility], [address] [place], [place] [address]    Report Status 09/15/2023 FINAL  Final  Blood culture (routine x 2)     Status: None   Collection Time: 09/10/23  2:34 PM   Specimen: BLOOD  Result Value Ref Range Status   Specimen Description   Final    BLOOD LEFT ANTECUBITAL Performed at [facility], [address] [place], [place] [address]    Special Requests   Final    BOTTLES DRAWN AEROBIC AND ANAEROBIC Blood Culture results may not be optimal due to an inadequate volume of blood received in culture bottles Performed at [facility], [address], [place], [place] [address]    Culture   Final    NO GROWTH 5 DAYS Performed at [facility], [address] [place], [place] [address]    Report Status 09/15/2023 FINAL  Final  Resp panel by RT-PCR (RSV, Flu A&B, Covid) Anterior Nasal Swab     Status: None   Collection Time: 09/10/23  2:59 PM   Specimen: Anterior Nasal Swab  Result Value Ref Range Status   SARS Coronavirus 2 by RT PCR NEGATIVE NEGATIVE Final  Comment: (NOTE) SARS-CoV-2 target nucleic acids are NOT DETECTED.  The SARS-CoV-2 RNA is generally detectable in upper respiratory specimens during the acute phase of infection. The lowest concentration of SARS-CoV-2 viral copies this  assay can detect is 138 copies/mL. A negative result does not preclude SARS-Cov-2 infection and should not be used as the sole basis for treatment or other patient management decisions. A negative result may occur with  improper specimen collection/handling, submission of specimen other than nasopharyngeal swab, presence of viral mutation(s) within the areas targeted by this assay, and inadequate number of viral copies(<138 copies/mL). A negative result must be combined with clinical observations, patient history, and epidemiological information. The expected result is Negative.  Fact Sheet for Patients:  bloggercourse.com  Fact Sheet for Healthcare Providers:  seriousbroker.it  This test is no t yet approved or cleared by the [place]  FDA and  has been authorized for detection and/or diagnosis of SARS-CoV-2 by FDA under an Emergency Use Authorization (EUA). This EUA will remain  in effect (meaning this test can be used) for the duration of the COVID-19 declaration under Section 564(b)(1) of the Act, 21 U.S.C.section 360bbb-3(b)(1), unless the authorization is terminated  or revoked sooner.       Influenza A by PCR NEGATIVE NEGATIVE Final   Influenza B by PCR NEGATIVE NEGATIVE Final    Comment: (NOTE) The Xpert Xpress SARS-CoV-2/FLU/RSV plus assay is intended as an aid in the diagnosis of influenza from Nasopharyngeal swab specimens and should not be used as a sole basis for treatment. Nasal washings and aspirates are unacceptable for Xpert Xpress SARS-CoV-2/FLU/RSV testing.  Fact Sheet for Patients: bloggercourse.com  Fact Sheet for Healthcare Providers: seriousbroker.it  This test is not yet approved or cleared by the [place]  FDA and has been authorized for detection and/or diagnosis of SARS-CoV-2 by FDA under an Emergency Use Authorization (EUA). This EUA will  remain in effect (meaning this test can be used) for the duration of the COVID-19 declaration under Section 564(b)(1) of the Act, 21 U.S.C. section 360bbb-3(b)(1), unless the authorization is terminated or revoked.     Resp Syncytial Virus by PCR NEGATIVE NEGATIVE Final    Comment: (NOTE) Fact Sheet for Patients: bloggercourse.com  Fact Sheet for Healthcare Providers: seriousbroker.it  This test is not yet approved or cleared by the [place]  FDA and has been authorized for detection and/or diagnosis of SARS-CoV-2 by FDA under an Emergency Use Authorization (EUA). This EUA will remain in effect (meaning this test can be used) for the duration of the COVID-19 declaration under Section 564(b)(1) of the Act, 21 U.S.C. section 360bbb-3(b)(1), unless the authorization is terminated or revoked.  Performed at [facility], [address], [place], [place] [address]   MRSA Next Gen by PCR, Nasal     Status: Abnormal   Collection Time: 09/11/23 12:13 PM   Specimen: Nasal Mucosa; Nasal Swab  Result Value Ref Range Status   MRSA by PCR Next Gen DETECTED (A) NOT DETECTED Final    Comment: RESULT CALLED TO, READ BACK BY AND VERIFIED WITH: RICHDERSON, B. RN AT 1402 ON 09/11/2023 BY MECIAL J. (NOTE) The GeneXpert MRSA Assay (FDA approved for NASAL specimens only), is one component of a comprehensive MRSA colonization surveillance program. It is not intended to diagnose MRSA infection nor to guide or monitor treatment for MRSA infections. Test performance is not FDA approved in patients less than 50 years old. Performed at [facility], [address] [place], [place]  [address]       Radiology Studies: No results found.     Scheduled Meds:  arformoterol   15 mcg Nebulization BID   aspirin  EC  81 mg Oral Daily   budesonide  (PULMICORT ) nebulizer solution  0.25 mg Nebulization BID    Chlorhexidine  Gluconate Cloth  6 each Topical Daily   diltiazem   240 mg Oral Daily   enoxaparin  (LOVENOX ) injection  80 mg Subcutaneous Q24H   ferrous sulfate   325 mg Oral BID WC   fluticasone   1 spray Each Nare Daily   furosemide   40 mg Intravenous BID   gabapentin   400 mg Oral BID   gabapentin   800 mg Oral QHS   Gerhardt's butt cream   Topical BID   guaiFENesin   600 mg Oral BID   insulin  aspart  0-20 Units Subcutaneous TID WC   insulin  aspart  0-5 Units Subcutaneous QHS   insulin  regular human CONCENTRATED  5 Units Subcutaneous TID WC   levothyroxine   200 mcg Oral QAC breakfast   mupirocin  ointment   Topical BID   nystatin    Topical TID   pantoprazole   40 mg Oral Daily   pramipexole   1 mg Oral TID   pregabalin   50 mg Oral TID   sodium chloride  flush  3 mL Intravenous Q12H   Continuous Infusions:  cefTRIAXone  (ROCEPHIN )  IV 2 g (09/15/23 1428)     LOS: 6 days     Lebron JINNY Cage, MD [facility] 09/16/2023, 3:12 PM   Available via Epic secure chat 7am-7pm After these hours, please refer to coverage provider listed on [email or web address]

## 2023-09-16 NOTE — Progress Notes (Signed)
 Patient placed on nasal CPAP auto mode with 4L oxygen bled in.  Patient tolerating well at this time.

## 2023-09-16 NOTE — Progress Notes (Signed)
   09/16/23 2200  BiPAP/CPAP/SIPAP  Reason BIPAP/CPAP not in use Non-compliant (patient refuses to wear tonight)  BiPAP/CPAP /SiPAP Vitals  Resp 19  MEWS Score/Color  MEWS Score 0  MEWS Score Color Green

## 2023-09-17 ENCOUNTER — Other Ambulatory Visit: Payer: Self-pay | Admitting: Family Medicine

## 2023-09-17 ENCOUNTER — Ambulatory Visit (HOSPITAL_BASED_OUTPATIENT_CLINIC_OR_DEPARTMENT_OTHER): Payer: Commercial Managed Care - HMO | Admitting: General Surgery

## 2023-09-17 ENCOUNTER — Inpatient Hospital Stay (HOSPITAL_COMMUNITY): Payer: Commercial Managed Care - HMO

## 2023-09-17 DIAGNOSIS — E559 Vitamin D deficiency, unspecified: Secondary | ICD-10-CM

## 2023-09-17 DIAGNOSIS — L03313 Cellulitis of chest wall: Secondary | ICD-10-CM | POA: Diagnosis not present

## 2023-09-17 DIAGNOSIS — J9601 Acute respiratory failure with hypoxia: Secondary | ICD-10-CM

## 2023-09-17 LAB — BASIC METABOLIC PANEL
Anion gap: 11 (ref 5–15)
BUN: 14 mg/dL (ref 8–23)
CO2: 36 mmol/L — ABNORMAL HIGH (ref 22–32)
Calcium: 8.2 mg/dL — ABNORMAL LOW (ref 8.9–10.3)
Chloride: 91 mmol/L — ABNORMAL LOW (ref 98–111)
Creatinine, Ser: 0.8 mg/dL (ref 0.44–1.00)
GFR, Estimated: >60 mL/min (ref >60)
Glucose, Bld: 188 mg/dL — ABNORMAL HIGH (ref 70–99)
Potassium: 3.5 mmol/L (ref 3.5–5.1)
Sodium: 138 mmol/L (ref 135–145)

## 2023-09-17 LAB — BLOOD GAS, ARTERIAL
Acid-Base Excess: 22.8 mmol/L — ABNORMAL HIGH (ref 0.0–2.0)
Bicarbonate: 49.5 mmol/L — ABNORMAL HIGH (ref 20.0–28.0)
Drawn by: 30136
O2 Saturation: 91 %
Patient temperature: 36.7
pCO2 arterial: 67 mm[Hg] (ref 32–48)
pH, Arterial: 7.47 — ABNORMAL HIGH (ref 7.35–7.45)
pO2, Arterial: 62 mm[Hg] — ABNORMAL LOW (ref 83–108)

## 2023-09-17 LAB — GLUCOSE, CAPILLARY
Glucose-Capillary: 194 mg/dL — ABNORMAL HIGH (ref 70–99)
Glucose-Capillary: 235 mg/dL — ABNORMAL HIGH (ref 70–99)
Glucose-Capillary: 222 mg/dL — ABNORMAL HIGH (ref 70–99)
Glucose-Capillary: 186 mg/dL — ABNORMAL HIGH (ref 70–99)

## 2023-09-17 MED ORDER — VITAMIN D (ERGOCALCIFEROL) 1.25 MG (50000 UNIT) PO CAPS: 50000.0000 [IU] | ORAL_CAPSULE | ORAL | 0 refills | Status: DC

## 2023-09-17 NOTE — Plan of Care (Signed)
  Problem: Fluid Volume: Goal: Hemodynamic stability will improve Outcome: Progressing   Problem: Clinical Measurements: Goal: Diagnostic test results will improve Outcome: Progressing Goal: Signs and symptoms of infection will decrease Outcome: Progressing   Problem: Respiratory: Goal: Ability to maintain adequate ventilation will improve Outcome: Progressing   Problem: Education: Goal: Knowledge of General Education information will improve Description: Including pain rating scale, medication(s)/side effects and non-pharmacologic comfort measures Outcome: Progressing   Problem: Health Behavior/Discharge Planning: Goal: Ability to manage health-related needs will improve Outcome: Progressing   Problem: Clinical Measurements: Goal: Ability to maintain clinical measurements within normal limits will improve Outcome: Progressing Goal: Will remain free from infection Outcome: Progressing Goal: Diagnostic test results will improve Outcome: Progressing Goal: Respiratory complications will improve Outcome: Progressing Goal: Cardiovascular complication will be avoided Outcome: Progressing   Problem: Activity: Goal: Risk for activity intolerance will decrease Outcome: Progressing   Problem: Nutrition: Goal: Adequate nutrition will be maintained Outcome: Progressing   Problem: Coping: Goal: Level of anxiety will decrease Outcome: Progressing   Problem: Elimination: Goal: Will not experience complications related to bowel motility Outcome: Progressing Goal: Will not experience complications related to urinary retention Outcome: Progressing   Problem: Pain Management: Goal: General experience of comfort will improve Outcome: Progressing   Problem: Safety: Goal: Ability to remain free from injury will improve Outcome: Progressing   Problem: Skin Integrity: Goal: Risk for impaired skin integrity will decrease Outcome: Progressing   Problem: Education: Goal:  Ability to describe self-care measures that may prevent or decrease complications (Diabetes Survival Skills Education) will improve Outcome: Progressing Goal: Individualized Educational Video(s) Outcome: Progressing   Problem: Coping: Goal: Ability to adjust to condition or change in health will improve Outcome: Progressing   Problem: Fluid Volume: Goal: Ability to maintain a balanced intake and output will improve Outcome: Progressing   Problem: Health Behavior/Discharge Planning: Goal: Ability to identify and utilize available resources and services will improve Outcome: Progressing Goal: Ability to manage health-related needs will improve Outcome: Progressing   Problem: Metabolic: Goal: Ability to maintain appropriate glucose levels will improve Outcome: Progressing   Problem: Nutritional: Goal: Maintenance of adequate nutrition will improve Outcome: Progressing Goal: Progress toward achieving an optimal weight will improve Outcome: Progressing   Problem: Skin Integrity: Goal: Risk for impaired skin integrity will decrease Outcome: Progressing   Problem: Tissue Perfusion: Goal: Adequacy of tissue perfusion will improve Outcome: Progressing

## 2023-09-17 NOTE — Progress Notes (Signed)
   09/17/23 2109  BiPAP/CPAP/SIPAP  BiPAP/CPAP/SIPAP Pt Type Adult  Reason BIPAP/CPAP not in use Non-compliant (Pt refusing  to wear bipap tonight, she states she cant tolerate machine)

## 2023-09-17 NOTE — Progress Notes (Signed)
 PO2 on 4 LPM= 62 (91%). Overall result appears to be metabolic alkalosis - therefore RT has asked RN to increase supplemental oxygen to 5 LPM. RT has left Sticky Note for MD to provide desired Sp02- RN aware.

## 2023-09-17 NOTE — Consult Note (Signed)
 NAME:  [name], MRN:  [id number], DOB:  1961/05/12, LOS: 7 ADMISSION DATE:  09/10/2023, CONSULTATION DATE:  1/6 REFERRING MD:  Donnamarie, CHIEF COMPLAINT:  dyspnea    History of Present Illness:  57 yof admitted 12/30 for evaluation of left lower extremity cellulitis and cellulitis under right breast. Also has chronic wounds to mult areas including abd, left lower leg, left labia and left breast. Initially seen and discharged 12/23 w/ same dx and treated w/ Cefadroxil .  On ER arrival found to also be tachycardic, tachypneic and w/ room air pulse ox 89%. He had CT abd to further eval abd pain this showed additional [place] edema at the abd wall, gluteal region and right breast. CT chest showed right basilar atx and sm effusion.  Hospital course  Admitted 12/30 , placed on supplemental oxygen , cultures sent   Started on IV antibiotics  On 12/31 reporting increased SOB-->treated w/ lasix  1/1 still short of breath ECHO showed nml EF. Lasix  increased 1/2 diuretics continued.  1/3 mild improvement but still SOB. CT chest small right effusion. Pulm edema Right basilar airspace disease  1/5 trial of CPAP pt refused 1/6 room air pulse ox 77% more short of breath had to inc [place] support and PCCM asked to eval  Pertinent  Medical History  HTN, CAD s/p prior DES to mid and distal RCA, IBD, MO (BMI 54) right AKA, HLD, DM, RA asthma, chronic dyspnea, PAF   Significant Hospital Events: Including procedures, antibiotic start and stop dates in addition to other pertinent events   Hospital course  Admitted 12/30 , placed on supplemental oxygen , cultures sent   Started on IV antibiotics  On 12/31 reporting increased SOB-->treated w/ lasix  1/1 still short of breath ECHO showed nml EF. Lasix  increased 1/2 diuretics continued.  1/3 mild improvement but still SOB. CT chest small right effusion. Pulm edema Right basilar airspace disease  1/5 trial of CPAP pt refused 1/6 room air pulse ox 77% more short of  breath had to inc [place] support and PCCM asked to eval   Interim History / Subjective:  Feels a little better but still SOB Swelling has improved sig   Objective   Blood pressure 110/65, pulse 86, temperature 98.1 F (36.7 C), temperature source Oral, resp. rate 18, height 5' 2.5 (1.588 m), weight (!) 145 kg, SpO2 100%.    FiO2 (%):  [40 %] 40 %   Intake/Output Summary (Last 24 hours) at 09/17/2023 1840 Last data filed at 09/17/2023 1700 Gross per 24 hour  Intake 1210 ml  Output 2150 ml  Net -940 ml   Filed Weights   09/15/23 0500 09/16/23 0500 09/17/23 0500  Weight: (!) 151.4 kg (!) 148.3 kg (!) 145 kg    Examination: General: obese 63 year old female. Still w/ marked exertional dyspnea. Gets SOB just working to sit up in bed HENT: NCAT no JVD  Lungs: bilateral basilar crackles. Dec'd base. Now on 5 liters [place]. Gets SOB w/ minimal exertion  Cardiovascular: RRR Abdomen: large + bowel sounds Extremities: chronic LE left left edema. W/ chronic venous stasis. Dressing to foot intact. 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   Plan Cont IV diuretics as long as BP/BUN/cr tolerate Cont supplemental oxygen  Cont lasix ; add spironolactone   Need to encourage nocturnal BIPAP; will trial auto-set BIPAP  Wt loss  Asthma Plan Cont BDs and SABA  Best Practice (right click and Reselect  all SmartList Selections daily)   Per primary   Labs   CBC: Recent Labs  Lab 09/11/23 0417 09/12/23 0448 09/13/23 0424 09/14/23 0414 09/15/23 0445  WBC 9.5 8.7 9.3 8.5 8.2  NEUTROABS  --   --  5.9 5.8 5.0  HGB 7.9* 7.9* 7.9* 8.0* 8.1*  HCT 31.5* 30.9* 30.4* 30.8* 32.3*  MCV 70.2* 67.9* 68.2* 68.6* 71.1*  PLT 280 297 308 289 253    Basic Metabolic Panel: Recent Labs  Lab 09/10/23 2047 09/11/23 0417 09/13/23 0424 09/13/23 1353 09/14/23 0414 09/14/23 0843 09/15/23 0445 09/16/23 0437 09/17/23 0431  NA  --    < > 137  --  136  --  136 137 138  K  --    < > 3.5  --  3.4*  --  3.9 4.0 3.5  CL  --    < > 97*  --  92*  --  90* 90* 91*  CO2  --    < > 32  --  34*  --  34* 36* 36*  GLUCOSE  --    < > 184*  --  172*  --  194* 199* 188*  BUN  --    < > 10  --  10  --  12 13 14   CREATININE  --    < > 0.81  --  0.73  --  0.88 0.71 0.80  CALCIUM   --    < > 8.4*  --  8.2*  --  8.2* 8.3* 8.2*  MG 2.0  --   --  1.8  --  1.8  --   --   --    < > = values in this interval not displayed.   GFR: Estimated Creatinine Clearance: 102.2 mL/min (by C-G formula based on SCr of 0.8 mg/dL). Recent Labs  Lab 09/12/23 0448 09/13/23 0424 09/14/23 0414 09/15/23 0445  WBC 8.7 9.3 8.5 8.2    Liver Function Tests: Recent Labs  Lab 09/11/23 0417  AST 27  ALT 22  ALKPHOS 80  BILITOT 0.6  PROT 6.5  ALBUMIN 2.8*   No results for input(s): LIPASE, AMYLASE in the last 168 hours. No results for input(s): AMMONIA in the last 168 hours.  ABG    Component Value Date/Time   PHART 7.47 (H) 09/17/2023 1721   PCO2ART 67 (HH) 09/17/2023 1721   PO2ART 62 (L) 09/17/2023 1721   HCO3 49.5 (H) 09/17/2023 1721   TCO2 28 01/21/2020 1800   ACIDBASEDEF 2.6 (H) 12/06/2008 0428   O2SAT 91 09/17/2023 1721     Coagulation Profile: Recent Labs  Lab 09/11/23 0417  INR 1.2    Cardiac Enzymes: No results for input(s): CKTOTAL, CKMB, CKMBINDEX, TROPONINI in the last 168  hours.  HbA1C: Hgb A1c MFr Bld  Date/Time Value Ref Range Status  09/10/2023 08:47 PM 8.0 (H) 4.8 - 5.6 % Final    Comment:    (NOTE)  Prediabetes: 5.7 - 6.4         Diabetes: >6.4         Glycemic control for adults with diabetes: <7.0   05/08/2023 09:53 AM 9.7 (H) 4.6 - 6.5 % Final    Comment:    Glycemic Control Guidelines for People with Diabetes:Non Diabetic:  <6%Goal of Therapy: <7%Additional Action Suggested:  >8%     CBG: Recent Labs  Lab 09/16/23 1637 09/16/23 2144 09/17/23 0751 09/17/23 1143 09/17/23 1654  GLUCAP 228* 174* 194* 235* 222*    Review of Systems:   Review of Systems  Constitutional:  Negative for fever.  HENT: Negative.    Eyes: Negative.   Respiratory:  Positive for sputum production and shortness of breath.   Cardiovascular:  Positive for orthopnea and leg swelling.  Gastrointestinal: Negative.   Genitourinary: Negative.   Musculoskeletal: Negative.   Skin:  Positive for rash.  Neurological: Negative.   Endo/Heme/Allergies: Negative.      Past Medical History:  She,  has a past medical history of Aortic stenosis, Asthma, Chronic diastolic CHF (congestive heart failure) (HCC) (09/2015), Chronic lower back pain, Chronic neck pain, Chronic pain syndrome, Colon polyps, Coronary artery disease, Eczema, Excessive daytime sleepiness (11/26/2015), Fibromyalgia, Gallstones, GERD (gastroesophageal reflux disease), Heart murmur, History of blood transfusion (07/2010), History of gout, History of hiatal hernia (1980s), Hyperlipidemia, Hypertension, Hypothyroidism, IBS (irritable bowel syndrome), Migraine, Mixed connective tissue disease (HCC), NAFLD (nonalcoholic fatty liver disease), Pneumonia, PVC's (premature ventricular contractions), Rheumatoid arthritis (HCC), Scoliosis, Sjogren's syndrome (HCC), Sleep apnea, Spondylosis, Type II diabetes mellitus (HCC), and Walker as ambulation aid.   Surgical History:   Past Surgical History:  Procedure  Laterality Date   ABDOMINAL HYSTERECTOMY  06/2005   w/right ovariy   AMPUTATION Right 01/28/2020    RIGHT FOURTH AND FIFTH RAY AMPUTATION   AMPUTATION Right 01/28/2020   Procedure: RIGHT FOURTH AND FIFTH RAY AMPUTATION,;  Surgeon: Harden Jerona GAILS, MD;  Location: [facility] OR;  Service: Orthopedics;  Laterality: Right;   AMPUTATION Right 06/01/2021   Procedure: RIGHT BELOW KNEE AMPUTATION;  Surgeon: Harden Jerona GAILS, MD;  Location[facility] OR;  Service: Orthopedics;  Laterality: Right;   AMPUTATION Right 07/22/2021   Procedure: RIGHT ABOVE KNEE AMPUTATION;  Surgeon: Harden Jerona GAILS, MD;  Location: [facility] OR;  Service: Orthopedics;  Laterality: Right;   APPENDECTOMY     BREAST BIOPSY Bilateral    9 total (04/18/2018)   CORONARY ANGIOPLASTY WITH STENT PLACEMENT  10/01/2015   normal LM, 30% LAD, 85% mid RCA and 95% distal RCA s/p PCI of the mid to distal RCA and now on DAPT with ASA and Ticagrelor .     CORONARY STENT INTERVENTION Right 04/18/2018   Procedure: CORONARY STENT INTERVENTION;  Surgeon: Verlin Lonni BIRCH, MD;  Location: [facility] INVASIVE CV LAB;  Service: Cardiovascular;  Laterality: Right;   DILATION AND CURETTAGE OF UTERUS     FRACTURE SURGERY     LAPAROSCOPIC CHOLECYSTECTOMY     LEFT HEART CATH AND CORONARY ANGIOGRAPHY N/A 04/18/2018   Procedure: LEFT HEART CATH AND CORONARY ANGIOGRAPHY;  Surgeon: Verlin Lonni BIRCH, MD;  Location: [facility] INVAS[facility];  Service: Cardiovascular;  Laterality: N/A;   MUSCLE BIOPSY Left    leg   OOPHORECTOMY  07/2010   RADIOLOGY WITH ANESTHESIA N/A 05/25/2016   Procedure: RADIOLOGY WITH ANESTHESIA;  Surgeon: Medication Radiologist, MD;  Location: [facility] OR;  Service: Radiology;  Laterality: N/A;   STUMP REVISION Right 06/29/2021   Procedure: REVISION RIGHT BELOW KNEE AMPUTATION;  Surgeon: Harden Jerona GAILS, MD;  Location: [facility] OR;  Service: Orthopedics;  Laterality: Right;   TEE WITHOUT CARDIOVERSION N/A 06/01/2021   Procedure: TRANSESOPHAGEAL ECHOCARDIOGRAM (TEE);  Surgeon:  Barbaraann Darryle Ned, MD;  Location: [facility] OR;  Service: Cardiovascular;  Laterality: N/A;   TONSILLECTOMY AND ADENOIDECTOMY     WRIST FRACTURE SURGERY Left    crushed it     Social History:   reports that she quit smoking about 19 years ago. Her smoking use included cigarettes. She started smoking about 39 years ago. She has a 19.4 pack-year smoking history. She has been exposed to tobacco smoke. She has never used smokeless tobacco. She reports current alcohol  use. She reports that she does not currently use drugs after having used the following drugs: Marijuana.   Family History:  Her family history includes Allergic rhinitis in her father and son; Asthma in her father and son; Breast cancer in her paternal aunt; CAD in her father and mother; Heart attack in her father and mother; Hypertension in her brother, brother, and mother; Lung cancer in her paternal aunt; Pancreatic cancer in her paternal aunt; Stroke in her mother.   Allergies Allergies  Allergen Reactions   Fish Allergy  Anaphylaxis    INCLUDES OMEGA 3 OILS   Fish Oil Anaphylaxis and Swelling    THROAT SWELLS INCLUDES FISH AS A CLASS   Gabapentin  Anxiety and Other (See Comments)    Anxiety on high doses, Angioedema   Ipratropium-Albuterol  Shortness Of Breath     Bronchospasm, Wheezing becomes worse, can take albuterol  with ipratropium   Omega-3 Fatty Acids Swelling    Throat swelling, Has tolerated Glucerna nutritional drinks     Peanut -Containing Drug Products Shortness Of Breath   Victoza  [Liraglutide ] Nausea And Vomiting    Patient reported severe, debilitating nausea   Lovastatin Other (See Comments)    EXTREME JOINT PAIN   Metronidazole  Nausea And Vomiting and Swelling    Headache and shakes, Pt tolerating IV 06/14/22    Penicillins Rash and Other (See Comments)    UNSPECIFIED, TOLERATES KEFLEX , CHILDHOOD RX, PCN CHALLENGE PER ALLERGIST, 02/29/16.  RESULTS NOT UPDATED.    Red Yeast Rice [Cholestin] Other (See  Comments)    Muscle pain and severe joint pain.    Rosuvastatin  Other (See Comments)    Extreme joint pain, to this & other statins    Rotigotine  Swelling    (Neupro ) Extreme edema    Atrovent Hfa [Ipratropium Bromide Hfa] Other (See Comments)    wheezing   Iodine  Swelling    Pt specifies allergen was Povidone-iodine  10 % swab. The reaction was localized to region (BLE) where topical iodine  was being applied.    Ipratropium Bromide Other (See Comments)    Wheezing becomes worse   Morphine     Other Other (See Comments)    Surgical Staples causes redness/infection per patient.   Pepto-Bismol [Bismuth Subsalicylate] Nausea And Vomiting    Extreme vomiting   Wound Dressing Adhesive Other (See Comments)    Electrodes causes skin breakdown, Electrodes causes skin breakdown   Enalapril  Cough   Metoprolol  Palpitations   Suprep [Na Sulfate-K Sulfate-Mg Sulf] Nausea And Vomiting   Tape Other (See Comments)    Electrodes causes skin breakdown     Home Medications  Prior to Admission medications   Medication Sig Start Date End Date Taking? Authorizing Provider  acetaminophen  (TYLENOL ) 500 MG tablet Take 1,000 mg by mouth as needed for mild pain (pain score 1-3).   Yes [provider]  albuterol  (PROVENTIL ) (2.5 MG/3ML) 0.083% nebulizer solution USE 1 VIAL IN NEBULIZER EVERY 4 HOURS AS NEEDED FOR WHEEZING FOR SHORTNESS OF BREATH 09/29/22  Yes Wendling, [name], DO  albuterol  (VENTOLIN  HFA) 108 (90 Base) MCG/ACT inhaler INHALE 2 PUFFS BY MOUTH EVERY 6 HOURS AS NEEDED FOR WHEEZING FOR SHORTNESS OF BREATH Patient taking differently: Inhale 2 puffs into the lungs every 4 (four) hours as needed for wheezing or shortness of breath. 04/26/23  Yes [name], DO  aspirin  EC 81 MG tablet Take 81 mg by mouth every evening. 12/22/15  Yes [provider]  augmented betamethasone  dipropionate (DIPROLENE  AF) 0.05 % cream Apply 1 Application topically 2 (two) times daily as  needed (rash). 01/30/23  Yes [name], DO  cefadroxil  (DURICEF) 500 MG capsule Take 1 capsule (500 mg total) by mouth 2 (two) times daily. 09/03/23  Yes Raford Lenis, MD  diclofenac  Sodium (PENNSAID ) 2 % SOLN APPLY 2 PUMPS (40MG  TOTAL) TOPICALLY TWICE DAILY AS NEEDED Patient taking differently: Apply topically in the morning and at bedtime. 1 application to back twice daily 08/17/23  Yes Wendling, [name], DO  diclofenac  Sodium (VOLTAREN ) 1 % GEL Apply 2 g topically 4 (four) times daily. Patient taking differently: Apply 1 Application topically 2 (two) times daily as needed (pain). 06/28/23  Yes Raulkar, Sven SQUIBB, MD  DILT-XR 240 MG 24 hr capsule Take 1 capsule (240 mg total) by mouth daily. 06/27/23  Yes Shlomo Wilbert SAUNDERS, MD  diphenhydrAMINE  25 mg in sodium chloride  0.9 % 50 mL Inject 25 mg into the vein as needed (allergy ). 04/05/13  Yes [provider]  EPINEPHrine  0.3 mg/0.3 mL IJ SOAJ injection Inject 0.3 mg into the muscle as needed for anaphylaxis. 09/07/22  Yes [name], DO  fluconazole  (DIFLUCAN ) 150 MG tablet Take 1 tab, repeat in 72 hours if no improvement. 09/10/23  Yes [name], DO  fluticasone  (FLONASE ) 50 MCG/ACT nasal spray USE 2 SPRAY(S) IN EACH NOSTRIL ONCE DAILY AS NEEDED FOR ALLERGIES OR  RHINITIS Patient taking differently: Place 2 sprays into both nostrils in the morning and at bedtime. 04/09/23  Yes [name], DO  furosemide  (LASIX ) 40 MG tablet Take 40 mg by mouth daily as needed for fluid or edema.   Yes [provider]  gabapentin  (NEURONTIN ) 400 MG capsule Take 1 capsule (400 mg total) by mouth 4 (four) times daily. TAKE 1 CAPSULE BY MOUTH IN THE MORNING AND 1 AT NOON AND 2 AT BEDTIME Strength: 400 mg Patient taking differently: Take 400-800 mg by mouth See admin instructions. Take 400 mg by mouth in the morning and the afternoon. Take 800 mg by mouth at bedtime. 05/11/23  Yes Raulkar, Sven SQUIBB, MD   gentamicin ointment (GARAMYCIN) 0.1 % Apply 1 Application topically in the morning and at bedtime. 07/23/23  Yes [provider]  Glucagon , rDNA, (GLUCAGON  EMERGENCY) 1 MG KIT Inject 1 mg into the vein daily as needed. 05/08/23  Yes [name], DO  guaiFENesin  (MUCINEX ) 600 MG 12 hr tablet Take 600 mg by mouth 2 (two) times daily.   Yes [provider]  HUMULIN  R 500 UNIT/ML injection INJECT 0.08-0.3 MLS (40-150 UNITS) INTO THE SKIN THREE TIMES DAILY WITH MEALS Patient taking differently: Inject 5-15 Units into the skin in the morning, at noon, and at bedtime. Takes 5 units three times a day with meals.  Uses SSI on top of the scheduled insulin  with 3-10 units depending on blood sugar  level 08/31/23  Yes Wendling, [name], DO  hydrocortisone  cream 1 % Apply 1 application topically 3 (three) times daily as needed for itching (minor skin irritation). Patient taking differently: Apply 1 application  topically in the morning and at bedtime. 06/24/21  Yes Love, Sharlet RAMAN, PA-C  isosorbide  mononitrate (IMDUR ) 60 MG 24 hr tablet Take 1 tablet (60 mg total) by mouth daily. 04/30/23  Yes Swinyer, Rosaline HERO, NP  levocetirizine (XYZAL ) 5 MG tablet TAKE 1 TABLET BY MOUTH ONCE DAILY IN THE EVENING 07/16/23  Yes Wendling, [name], DO  lidocaine  (LIDODERM ) 5 % Place 1 patch onto the skin every 12 (twelve) hours. Remove & Discard patch within 12 hours or as directed Patient taking differently: Place 1 patch onto the skin daily as needed (pain). Remove & Discard patch within 12 hours or as directed 04/12/23  Yes Debby Genre L, NP  lidocaine  (XYLOCAINE ) 5 % ointment Apply 1 Application topically in the morning and at bedtime. 02/02/23  Yes [provider]  Multiple Vitamins-Minerals (WOMENS 50+ MULTI VITAMIN PO) Take 1 tablet by mouth in the morning and at bedtime.   Yes [provider]  mupirocin  ointment (BACTROBAN ) 2 % Apply topically 2 (two) times  daily. Patient taking differently: Apply 1 Application topically 2 (two) times daily. 05/08/23  Yes [name], DO  naloxone  (NARCAN ) nasal spray 4 mg/0.1 mL Place 1 spray into the nose as needed (opoid overdose).   Yes [provider]  naproxen sodium (ALEVE) 220 MG tablet Take 220 mg by mouth 2 (two) times daily as needed (pain).   Yes [provider]  nitroGLYCERIN  (NITROSTAT ) 0.4 MG SL tablet DISSOLVE ONE TABLET UNDER THE TONGUE EVERY 5 MINUTES AS NEEDED FOR CHEST PAIN.  DO NOT EXCEED A TOTAL OF 3 DOSES IN 15 MINUTES 08/23/21  Yes Shlomo Wilbert SAUNDERS, MD  omeprazole  (PRILOSEC) 40 MG capsule Take 1 capsule by mouth once daily 08/24/23  Yes Wendling, [name], DO  Oxycodone  HCl 20 MG TABS Take 1 tablet (20 mg total) by mouth every 8 (eight) hours as needed. 08/16/23  Yes Debby Genre CROME, NP  oxymetazoline  (AFRIN) 0.05 % nasal spray Place 2 sprays into both nostrils 2 (two) times daily as needed for congestion.   Yes [provider]  Polyethyl Glyc-Propyl Glyc PF (SYSTANE PRESERVATIVE FREE) 0.4-0.3 % SOLN Place 2 drops into both eyes in the morning and at bedtime. 05/11/16  Yes [provider]  pramipexole  (MIRAPEX ) 1 MG tablet Take 1 tablet (1 mg total) by mouth 3 (three) times daily. 08/08/23  Yes Saguier, [place], PA-C  pregabalin  (LYRICA ) 50 MG capsule Take 1 capsule (50 mg total) by mouth 3 (three) times daily. 05/11/23  Yes Raulkar, Sven SQUIBB, MD  promethazine  (PHENERGAN ) 25 MG tablet Take 1 tablet (25 mg total) by mouth every 8 (eight) hours as needed for vomiting or nausea. Patient taking differently: Take 25 mg by mouth as needed for vomiting or nausea. 04/20/23  Yes [name], DO  Continuous Glucose Sensor (DEXCOM G7 SENSOR) MISC USE TO CHECK FOR BLOOD SUGAR CONTINUOUSLY- CHANGE SENSOR EVERY 10 DAYS 05/08/23   [name], DO  Diclofenac  Sodium 3 % GEL Apply 1 Application topically 4 (four) times daily as needed. Patient  not taking: Reported on 09/11/2023 05/11/23   Lorilee Sven SQUIBB, MD  INSULIN  SYRINGE 1CC/29G (B-D INSULIN  SYRINGE) 29G X 1/2 1 ML MISC Use three times daily with insulin .  DX E11.9 06/11/20   Frann Mabel  Paul, DO  levothyroxine  (SYNTHROID ) 200 MCG tablet TAKE 1 TABLET BY MOUTH ONCE DAILY BEFORE BREAKFAST 09/17/23   [name], DO  metFORMIN  (GLUCOPHAGE -XR) 500 MG 24 hr tablet Take 2 tablets by mouth twice daily 09/17/23   [name], DO  nystatin  (MYCOSTATIN /NYSTOP ) powder Apply 1 Application topically 3 (three) times daily. R breast Patient not taking: Reported on 09/11/2023 09/10/23   [name], DO  OneTouch Delica Lancets 33G MISC Use to check blood glucose up to 4 times a day 11/16/22   [name], DO  [facility] VERIO test strip Use as instructed 06/27/23   [name], DO  Vitamin D , Ergocalciferol , (DRISDOL ) 1.25 MG (50000 UNIT) CAPS capsule Take 1 capsule (50,000 Units total) by mouth once a week. 09/17/23   [name], DO     Critical care time: NA

## 2023-09-17 NOTE — Progress Notes (Signed)
 When RT arrived to PT room to obtain ordered (Routine) ABG PT was receiving nebulizer treatment from RN ( on 8 LPM 02) while still on 4 LPM nasal cannula (total 12 LPM). In order to collect ABG on 4 LPM RT will return in approximately half hour. RN aware.

## 2023-09-17 NOTE — Progress Notes (Addendum)
 PROGRESS NOTE    [name]  FMW:[id number] DOB: [birth year] DOA: 09/10/2023 PCP: [name], DO     Brief Narrative:  [name] is a 62 y.o. female with medical history significant of morbid obesity, right AKA, irritable bowel syndrome, HTN, HLD, DM, RA, asthma who presented to ED at her PCPs recommendation with worsening cellulitis of right breast.  Reportedly, patient initially presented to ED on 09/03/2023 and was diagnosed with left lower extremity as well as cellulitis under right breast and was discharged home on cefadroxil .  Patient continued to take medications as prescribed however her cellulitis continued to get worse.  She was seen by her PCP via virtual visit and was advised to come to the emergency department.  She was started on vancomycin .    New events last 24 hours / Subjective: Patient noted to be more short of breath today, saturating about 79% on room air at rest and upon ambulation, requiring about 4 L of O2 while moving, with sats around 92%.  Noted worsening cough.  Denies any chest pain, abdominal pain, nausea/vomiting, fever/chills.   Addendum: ABG likely showed chronic CO2 retention.  Repeat chest x-ray showed vascular congestion, only slightly improved.  Patient will benefit from BiPAP at night, although refused CPAP due to claustrophobia.  PCCM will see on 09/18/2023.  May still be fluid overloaded, will consult cardiology in AM.  Will continue IV diuresis in a.m. pending cardiology recommendations as patient is hypochloremic.  Saw patient around 5 PM today, no significant respiratory distress noted while at rest on 4 L of O2 saturating in the 90s.  Warm peripheries.  Awake, alert, oriented x 4.    Assessment & Plan:   Principal Problem:   Cellulitis Active Problems:   Chronic diastolic CHF (congestive heart failure) (HCC)   Type 2 diabetes mellitus with hyperglycemia, with long-term current use of insulin  (HCC)   CAD S/P percutaneous  coronary angioplasty   Iron  deficiency anemia   Mixed connective tissue disease (HCC)   Gastroesophageal reflux disease   Morbid obesity (HCC)   Sepsis (HCC)   Sepsis secondary to right breast cellulitis Sepsis present on admission with tachycardia, tachypnea Blood cultures NGTD Nonpurulent in nature, significant edema Switched Vancomycin  --> Rocephin  Nystatin  powder for fungal infection  Multiple chronic wounds including abdomen, left breast, left lower extremity, labia Appreciate wound RN recommendations/wound care Antibiotics as above  Possible acute on chronic diastolic HF Appeared overloaded BNP 106, falsely low in morbidly obese patient Chest x-ray with mild to moderate CHF Echo with EF of 60 to 65%, left ventricular diastolic parameters are indeterminate Hold diuresis with IV Lasix  twice daily today as bicarb is rising Repeat chest x-ray pending Strict I's and O's, daily weights Daily BMP Supplemental O2 as needed  Hypokalemia Replace as needed  History of asthma/?COPD Patient reports history of asthma Started on Pulmicort , Brovana  Xopenex  ordered, stated Dulera  never worked for her and insist on having albuterol  as needed inhaler Supplemental O2 as needed  Acute on chronic hypoxic respiratory failure Noted to be saturating about 79% on room air, now requiring about 4-5L of O2, noted cough Elevated D-dimer, CTA chest negative for acute PE, noted possible atelectasis, cardiomegaly ABG pending Repeat chest x-ray pending Plan to consult PCCM Management as above  CAD Aspirin   Hypertension Continue PTA diltiazem  Held Imdur  for now  Diabetes mellitus Continue Humulin  R, sliding scale insulin   Iron  deficiency anemia Microcytic anemia Anemia panel showed: Iron  13, TIBC 536, sats 2.4, ferritin  5.3 Folate-->30, vitamin B12-->672 Received 1 dose of IV iron  on 09/13/2023 Continue ferrous sulfate   Hypothyroidism Euthyroid sick syndrome Synthroid  Repeat TSH  as outpatient in 4 to 6 weeks  Chronic pain Oxycodone , gabapentin   Likely OSA/obesity hypoventilation syndrome Trial of CPAP at bedtime, unable to tolerate Supplemental O2   DVT prophylaxis: Lovenox    Code Status: Full code Family Communication: None Disposition Plan: Home with home health, Vs SNF Status is: Inpatient Remains inpatient appropriate because: Level of care    Antimicrobials:  Anti-infectives (From admission, onward)    Start     Dose/Rate Route Frequency Ordered Stop   09/11/23 1800  vancomycin  (VANCOCIN ) 1,750 mg in sodium chloride  0.9 % 500 mL IVPB  Status:  Discontinued        1,750 mg 258.8 mL/hr over 120 Minutes Intravenous Every 24 hours 09/10/23 2052 09/11/23 1309   09/11/23 1400  cefTRIAXone  (ROCEPHIN ) 2 g in sodium chloride  0.9 % 100 mL IVPB        2 g 200 mL/hr over 30 Minutes Intravenous Every 24 hours 09/11/23 1309 09/17/23 1015   09/11/23 0015  fluconazole  (DIFLUCAN ) tablet 100 mg  Status:  Discontinued        100 mg Oral Daily 09/10/23 2005 09/11/23 0948   09/10/23 2000  vancomycin  (VANCOCIN ) IVPB 1000 mg/200 mL premix  Status:  Discontinued        1,000 mg 200 mL/hr over 60 Minutes Intravenous  Once 09/10/23 1958 09/11/23 0005   09/10/23 1415  cefTRIAXone  (ROCEPHIN ) 2 g in sodium chloride  0.9 % 100 mL IVPB        2 g 200 mL/hr over 30 Minutes Intravenous  Once 09/10/23 1406 09/10/23 1518   09/10/23 1415  vancomycin  (VANCOCIN ) IVPB 1000 mg/200 mL premix        1,000 mg 200 mL/hr over 60 Minutes Intravenous Every 1 hr x 2 09/10/23 1408 09/10/23 1923        Objective: Vitals:   09/17/23 0500 09/17/23 0503 09/17/23 0739 09/17/23 1327  BP:  (!) 127/94  110/65  Pulse:  (!) 102  86  Resp:  18  18  Temp:  98 F (36.7 C)  98.1 F (36.7 C)  TempSrc:  Oral  Oral  SpO2:  95% 96% 100%  Weight: (!) 145 kg     Height:        Intake/Output Summary (Last 24 hours) at 09/17/2023 1614 Last data filed at 09/17/2023 1423 Gross per 24 hour  Intake  1210 ml  Output 850 ml  Net 360 ml   Filed Weights   09/15/23 0500 09/16/23 0500 09/17/23 0500  Weight: (!) 151.4 kg (!) 148.3 kg (!) 145 kg    Examination:  General: NAD  Cardiovascular: S1, S2 present Respiratory: Diminished breath sounds bilaterally Abdomen: Soft, nontender, obese, bowel sounds present Musculoskeletal: Noted right AKA  Skin: Multiple wounds noted, right breast with swelling/erythema/induration Psychiatry: Normal mood    Data Reviewed: I have personally reviewed following labs and imaging studies  CBC: Recent Labs  Lab 09/11/23 0417 09/12/23 0448 09/13/23 0424 09/14/23 0414 09/15/23 0445  WBC 9.5 8.7 9.3 8.5 8.2  NEUTROABS  --   --  5.9 5.8 5.0  HGB 7.9* 7.9* 7.9* 8.0* 8.1*  HCT 31.5* 30.9* 30.4* 30.8* 32.3*  MCV 70.2* 67.9* 68.2* 68.6* 71.1*  PLT 280 297 308 289 253   Basic Metabolic Panel: Recent Labs  Lab 09/10/23 2047 09/11/23 0417 09/13/23 0424 09/13/23 1353 09/14/23 0414 09/14/23 0843 09/15/23 0445  09/16/23 0437 09/17/23 0431  NA  --    < > 137  --  136  --  136 137 138  K  --    < > 3.5  --  3.4*  --  3.9 4.0 3.5  CL  --    < > 97*  --  92*  --  90* 90* 91*  CO2  --    < > 32  --  34*  --  34* 36* 36*  GLUCOSE  --    < > 184*  --  172*  --  194* 199* 188*  BUN  --    < > 10  --  10  --  12 13 14   CREATININE  --    < > 0.81  --  0.73  --  0.88 0.71 0.80  CALCIUM   --    < > 8.4*  --  8.2*  --  8.2* 8.3* 8.2*  MG 2.0  --   --  1.8  --  1.8  --   --   --    < > = values in this interval not displayed.   GFR: Estimated Creatinine Clearance: 102.2 mL/min (by C-G formula based on SCr of 0.8 mg/dL). Liver Function Tests: Recent Labs  Lab 09/11/23 0417  AST 27  ALT 22  ALKPHOS 80  BILITOT 0.6  PROT 6.5  ALBUMIN 2.8*   No results for input(s): LIPASE, AMYLASE in the last 168 hours. No results for input(s): AMMONIA in the last 168 hours. Coagulation Profile: Recent Labs  Lab 09/11/23 0417  INR 1.2   Cardiac  Enzymes: No results for input(s): CKTOTAL, CKMB, CKMBINDEX, TROPONINI in the last 168 hours.  BNP (last 3 results) No results for input(s): PROBNP in the last 8760 hours. HbA1C: No results for input(s): HGBA1C in the last 72 hours.  CBG: Recent Labs  Lab 09/16/23 1119 09/16/23 1637 09/16/23 2144 09/17/23 0751 09/17/23 1143  GLUCAP 210* 228* 174* 194* 235*   Lipid Profile: No results for input(s): CHOL, HDL, LDLCALC, TRIG, CHOLHDL, LDLDIRECT in the last 72 hours. Thyroid  Function Tests: No results for input(s): TSH, T4TOTAL, FREET4, T3FREE, THYROIDAB in the last 72 hours.  Anemia Panel: No results for input(s): VITAMINB12, FOLATE, FERRITIN, TIBC, IRON , RETICCTPCT in the last 72 hours.  Sepsis Labs: No results for input(s): PROCALCITON, LATICACIDVEN in the last 168 hours.  Recent Results (from the past 240 hours)  Blood culture (routine x 2)     Status: None   Collection Time: 09/10/23  2:32 PM   Specimen: BLOOD  Result Value Ref Range Status   Specimen Description   Final    BLOOD RIGHT ANTECUBITAL Performed at [facility], [address], [place], [place] [address]    Special Requests   Final    BOTTLES DRAWN AEROBIC AND ANAEROBIC Blood Culture results may not be optimal due to an inadequate volume of blood received in culture bottles Performed at [facility], [address] [place], [place] [address]    Culture   Final    NO GROWTH 5 DAYS Performed at [facility], [address], [place], [place] [address]    Report Status 09/15/2023 FINAL  Final  Blood culture (routine x 2)     Status: None   Collection Time: 09/10/23  2:34 PM   Specimen: BLOOD  Result Value Ref Range Status   Specimen Description   Final    BLOOD LEFT  ANTECUBITAL Performed at [facility], [address] [place], [place] [address]    Special Requests   Final    BOTTLES DRAWN  AEROBIC AND ANAEROBIC Blood Culture results may not be optimal due to an inadequate volume of blood received in culture bottles Performed at [facility], [address], [place], [place] [address]    Culture   Final    NO GROWTH 5 DAYS Performed at [facility], [address] [place], [place] [address]    Report Status 09/15/2023 FINAL  Final  Resp panel by RT-PCR (RSV, Flu A&B, Covid) Anterior Nasal Swab     Status: None   Collection Time: 09/10/23  2:59 PM   Specimen: Anterior Nasal Swab  Result Value Ref Range Status   SARS Coronavirus 2 by RT PCR NEGATIVE NEGATIVE Final    Comment: (NOTE) SARS-CoV-2 target nucleic acids are NOT DETECTED.  The SARS-CoV-2 RNA is generally detectable in upper respiratory specimens during the acute phase of infection. The lowest concentration of SARS-CoV-2 viral copies this assay can detect is 138 copies/mL. A negative result does not preclude SARS-Cov-2 infection and should not be used as the sole basis for treatment or other patient management decisions. A negative result may occur with  improper specimen collection/handling, submission of specimen other than nasopharyngeal swab, presence of viral mutation(s) within the areas targeted by this assay, and inadequate number of viral copies(<138 copies/mL). A negative result must be combined with clinical observations, patient history, and epidemiological information. The expected result is Negative.  Fact Sheet for Patients:  bloggercourse.com  Fact Sheet for Healthcare Providers:  seriousbroker.it  This test is no t yet approved or cleared by the [place]  FDA and  has been authorized for detection and/or diagnosis of SARS-CoV-2 by FDA under an Emergency Use Authorization (EUA). This EUA will remain  in effect (meaning this test can be used) for the duration of the COVID-19 declaration under Section 564(b)(1) of  the Act, 21 U.S.C.section 360bbb-3(b)(1), unless the authorization is terminated  or revoked sooner.       Influenza A by PCR NEGATIVE NEGATIVE Final   Influenza B by PCR NEGATIVE NEGATIVE Final    Comment: (NOTE) The Xpert Xpress SARS-CoV-2/FLU/RSV plus assay is intended as an aid in the diagnosis of influenza from Nasopharyngeal swab specimens and should not be used as a sole basis for treatment. Nasal washings and aspirates are unacceptable for Xpert Xpress SARS-CoV-2/FLU/RSV testing.  Fact Sheet for Patients: bloggercourse.com  Fact Sheet for Healthcare Providers: seriousbroker.it  This test is not yet approved or cleared by the [place]  FDA and has been authorized for detection and/or diagnosis of SARS-CoV-2 by FDA under an Emergency Use Authorization (EUA). This EUA will remain in effect (meaning this test can be used) for the duration of the COVID-19 declaration under Section 564(b)(1) of the Act, 21 U.S.C. section 360bbb-3(b)(1), unless the authorization is terminated or revoked.     Resp Syncytial Virus by PCR NEGATIVE NEGATIVE Final    Comment: (NOTE) Fact Sheet for Patients: bloggercourse.com  Fact Sheet for Healthcare Providers: seriousbroker.it  This test is not yet approved or cleared by the [place]  FDA and has been authorized for detection and/or diagnosis of SARS-CoV-2 by FDA under an Emergency Use Authorization (EUA). This EUA will remain in effect (meaning this test can be used) for the duration of the COVID-19 declaration under Section 564(b)(1) of the Act, 21 U.S.C. section 360bbb-3(b)(1), unless the authorization is  terminated or revoked.  Performed at [name], [address] [place], [place] [address]   MRSA Next Gen by PCR, Nasal     Status: Abnormal   Collection Time: 09/11/23 12:13 PM   Specimen: Nasal Mucosa;  Nasal Swab  Result Value Ref Range Status   MRSA by PCR Next Gen DETECTED (A) NOT DETECTED Final    Comment: RESULT CALLED TO, READ BACK BY AND VERIFIED WITH: RICHDERSON, B. RN AT 1402 ON 09/11/2023 BY MECIAL J. (NOTE) The GeneXpert MRSA Assay (FDA approved for NASAL specimens only), is one component of a comprehensive MRSA colonization surveillance program. It is not intended to diagnose MRSA infection nor to guide or monitor treatment for MRSA infections. Test performance is not FDA approved in patients less than 91 years old. Performed at [facility], [address], [place], [place] [address]       Radiology Studies: DG Hand 2 View Right Result Date: 09/16/2023 CLINICAL DATA:  Wrist pain.  Ongoing pain to base of thumb. EXAM: RIGHT HAND - 2 VIEW COMPARISON:  02/23/16. FINDINGS: No signs of acute fracture or dislocation. Scattered D IP joint osteoarthritis noted which is most severe at the second DIP joint where there is radial deviation of the distal phalanx. Mild degenerative changes identified at the basilar joint. Soft tissues are unremarkable. IMPRESSION: 1. No acute findings. 2. 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 3. Basilar joint osteoarthritis. Electronically Signed   By: Waddell Calk M.D.   On: 09/16/2023 17:53       Scheduled Meds:  arformoterol   15 mcg Nebulization BID   aspirin  EC  81 mg Oral Daily   budesonide  (PULMICORT ) nebulizer solution  0.25 mg Nebulization BID   Chlorhexidine  Gluconate Cloth  6 each Topical Daily   diltiazem   240 mg Oral Daily   enoxaparin  (LOVENOX ) injection  80 mg Subcutaneous Q24H   ferrous sulfate   325 mg Oral BID WC   fluticasone   1 spray Each Nare Daily   gabapentin   400 mg Oral BID   gabapentin   800 mg Oral QHS   Gerhardt's butt cream   Topical BID   guaiFENesin   600 mg Oral BID   insulin  aspart  0-20 Units Subcutaneous TID WC   insulin  aspart  0-5 Units Subcutaneous  QHS   insulin  regular human CONCENTRATED  5 Units Subcutaneous TID WC   levothyroxine   200 mcg Oral QAC breakfast   mupirocin  ointment   Topical BID   nystatin    Topical TID   pantoprazole   40 mg Oral Daily   pramipexole   1 mg Oral TID   pregabalin   50 mg Oral TID   sodium chloride  flush  3 mL Intravenous Q12H   Continuous Infusions:     LOS: 7 days     Lebron JINNY Cage, MD [facility] 09/17/2023, 4:14 PM   Available via Epic secure chat 7am-7pm After these hours, please refer to coverage provider listed on [email or web address]

## 2023-09-17 NOTE — Progress Notes (Signed)
 SATURATION QUALIFICATIONS: (This note is used to comply with regulatory documentation for home oxygen )  Patient Saturations on Room Air at Rest = 79%  Patient Saturations on Room Air while Ambulating = 78%  Patient Saturations on 4 Liters of oxygen  while Ambulating =92%  Please briefly explain why patient needs home oxygen :Patient requires supplemental oxygen  to maintain saturation >88 % for mobility.  Patient is non ambulatory, saturations taken  during bed mobility and sitting on bed edge. Darice Potters PT Acute Rehabilitation Services Office [phone] Weekend [phone]

## 2023-09-17 NOTE — Addendum Note (Signed)
 Addended by: Radene Gunning on: 09/17/2023 11:15 AM   Modules accepted: Orders

## 2023-09-17 NOTE — Progress Notes (Signed)
 Oxygen testing  Room air resting 77%  4L O2 applied and SPO2 92%

## 2023-09-17 NOTE — Plan of Care (Signed)
  Problem: Activity: Goal: Risk for activity intolerance will decrease Outcome: Progressing   Problem: Nutrition: Goal: Adequate nutrition will be maintained Outcome: Progressing   Problem: Pain Management: Goal: General experience of comfort will improve Outcome: Progressing   Problem: Safety: Goal: Ability to remain free from injury will improve Outcome: Progressing   Problem: Skin Integrity: Goal: Risk for impaired skin integrity will decrease Outcome: Progressing

## 2023-09-17 NOTE — Progress Notes (Signed)
 Physical Therapy Treatment Patient Details Name: [name] MRN: [id number] DOB: [birth year] Today's Date: 09/17/2023   History of Present Illness [name] is a 63 y.o.presented 09/10/23 from PCP for worsening Right breast cellulitis, LLE cellulitis. PMH: morbid obesity, right AKA, irritable bowel syndrome, hypertension, hyperlipidemia, hypothyroidism, type 2 diabetes mellitus, rheumatoid arthritis, asthma    PT Comments  The patient is noted to be SOB just resting in bed. See saturation  note. Patient requiring supplemental  O2.  Assisted patient to sitting onto bed edge. Made attempt to stand at [facility]. Patient unable to push up on RUE, reporting anterior  shoulder pain. Patient reports feeling a pop in the shoulder prior to coming to the hospital. Patient has limited shoulder forward flexion and rotation.  Discussed DC options with pt- SNF/rehab vs HHPT. Recommend Hospital bed if DC home. Patient is home alone while spouse works, usually 2-3 hours in AM and PM. Patient currently unable to transfer  to get to [facility] or toilet.  Patient will benefit from continued inpatient follow up therapy, <3 hours/day     If plan is discharge home, recommend the following: Two people to help with walking and/or transfers;Two people to help with bathing/dressing/bathroom;Assist for transportation;Help with stairs or ramp for entrance;A lot of help with bathing/dressing/bathroom   Can travel by private vehicle     No  Equipment Recommendations  Hospital bed    Recommendations for Other Services       Precautions / Restrictions Precautions Precautions: Fall Precaution Comments: R AKA, right shoulder pain ? RTC injury/ Restrictions Weight Bearing Restrictions Per Provider Order: No     Mobility  Bed Mobility Overal bed mobility: Needs Assistance Bed Mobility: Rolling, Supine to Sit, Sit to Supine Rolling: Mod assist   Supine to sit: Mod assist, +2 for safety/equipment, HOB elevated, Used  rails Sit to supine: Max assist, +2 for physical assistance, +2 for safety/equipment   General bed mobility comments: patient assisting with rolling with use of rails and bed pad. Max assist to sit upright, patient able to use right rail to scoot  to bed edge. max of 2 to return to supine    Transfers                        Ambulation/Gait                   Stairs             Wheelchair Mobility     Tilt Bed    Modified Rankin (Stroke Patients Only)       Balance Overall balance assessment: Needs assistance, History of Falls Sitting-balance support: Single extremity supported, Feet unsupported Sitting balance-[name]: Fair Sitting balance - Comments: able to sit, tends to prop to the right Postural control: Right lateral lean     Standing balance comment: unable to stand                            Cognition Arousal: Alert Behavior During Therapy: Lability, WFL for tasks assessed/performed                                   General Comments: patient labile when unable to  bear wt on RUE to attempt standing at the RW.        Exercises  General Comments        Pertinent Vitals/Pain Pain Assessment Faces Pain Scale: Hurts whole lot Pain Location: right shoulder, Pain Descriptors / Indicators: Discomfort, Crying Pain Intervention(s): Monitored during session    Home Living                          Prior Function            PT Goals (current goals can now be found in the care plan section) Progress towards PT goals: Progressing toward goals    Frequency    Min 1X/week      PT Plan      Co-evaluation              AM-PAC PT 6 Clicks Mobility   Outcome Measure  Help needed turning from your back to your side while in a flat bed without using bedrails?: A Lot Help needed moving from lying on your back to sitting on the side of a flat bed without using bedrails?:  Total Help needed moving to and from a bed to a chair (including a wheelchair)?: Total Help needed standing up from a chair using your arms (e.g., wheelchair or bedside chair)?: Total Help needed to walk in hospital room?: Total Help needed climbing 3-5 steps with a railing? : Total 6 Click Score: 7    End of Session Equipment Utilized During Treatment: Gait belt Activity Tolerance: Patient limited by fatigue;Treatment limited secondary to medical complications (Comment) Patient left: in bed;with call bell/phone within reach;with bed alarm set Nurse Communication: Mobility status;Need for lift equipment PT Visit Diagnosis: Unsteadiness on feet (R26.81);Muscle weakness (generalized) (M62.81);Difficulty in walking, not elsewhere classified (R26.2);Pain Pain - Right/Left: Right Pain - part of body: Shoulder     Time: 1400-1500 PT Time Calculation (min) (ACUTE ONLY): 60 min  Charges:    $Therapeutic Activity: 53-67 mins PT General Charges $$ ACUTE PT VISIT: 1 Visit                     Darice Potters PT Acute Rehabilitation Services Office [phone] Weekend pager-[phone]    Potters Darice Norris 09/17/2023, 3:15 PM

## 2023-09-17 NOTE — TOC Progression Note (Addendum)
 Transition of Care [facility]) - Progression Note    Patient Details  Name: [name] MRN: [id number] Date of Birth: June 28, 1961  [facility] CM/SW Contact  Toy LITTIE Agar, RN Phone Number:[phone]  09/17/2023, 12:34 PM  Clinical Narrative:    CM followed up with Amy with [facility] to determine status of home health set up. Awaiting follow up for update.     1324 CM spoke with Greig Cedar with [facility]. Per Amy, [facility] is able to offer Home health services but the patient will have a 25% copay and her insurance will cover 75%  Expected Discharge Plan: Home w Home Health Services Barriers to Discharge: Continued Medical Work up  Expected Discharge Plan and Services       Living arrangements for the past 2 months: Single Family Home                                       Social Determinants of Health (SDOH) Interventions SDOH Screenings   Food Insecurity: No Food Insecurity (09/11/2023)  Housing: High Risk (09/12/2023)  Transportation Needs: Unmet Transportation Needs (09/11/2023)  Utilities: Not At Risk (09/11/2023)  Alcohol  Screen: Low Risk  (09/10/2023)  Depression (PHQ2-9): Low Risk  (07/10/2023)  Financial Resource Strain: Medium Risk (09/10/2023)  Physical Activity: Unknown (09/10/2023)  Social Connections: Socially Isolated (09/10/2023)  Stress: No Stress Concern Present (09/10/2023)  Tobacco Use: Medium Risk (09/10/2023)    Readmission Risk Interventions    12/23/2021    4:00 PM  Readmission Risk Prevention Plan  Transportation Screening Complete  Medication Review (RN Care Manager) Complete  PCP or Specialist appointment within 3-5 days of discharge Complete  HRI or Home Care Consult Complete  SW Recovery Care/Counseling Consult Complete  Palliative Care Screening Not Applicable  Skilled Nursing Facility Not Applicable

## 2023-09-17 NOTE — Progress Notes (Signed)
 Notified [facility]) that ABG being sent for analysis.

## 2023-09-18 DIAGNOSIS — I5033 Acute on chronic diastolic (congestive) heart failure: Secondary | ICD-10-CM | POA: Diagnosis not present

## 2023-09-18 DIAGNOSIS — J9621 Acute and chronic respiratory failure with hypoxia: Secondary | ICD-10-CM | POA: Insufficient documentation

## 2023-09-18 DIAGNOSIS — I5032 Chronic diastolic (congestive) heart failure: Secondary | ICD-10-CM

## 2023-09-18 DIAGNOSIS — I2510 Atherosclerotic heart disease of native coronary artery without angina pectoris: Secondary | ICD-10-CM | POA: Diagnosis not present

## 2023-09-18 DIAGNOSIS — L03313 Cellulitis of chest wall: Secondary | ICD-10-CM | POA: Diagnosis not present

## 2023-09-18 LAB — CBC WITH DIFFERENTIAL/PLATELET
Abs Immature Granulocytes: 0.02 10*3/uL (ref 0.00–0.07)
Basophils Absolute: 0.1 10*3/uL (ref 0.0–0.1)
Basophils Relative: 1 %
Eosinophils Absolute: 0.5 10*3/uL (ref 0.0–0.5)
Eosinophils Relative: 6 %
HCT: 34.9 % — ABNORMAL LOW (ref 36.0–46.0)
Hemoglobin: 9 g/dL — ABNORMAL LOW (ref 12.0–15.0)
Immature Granulocytes: 0 %
Lymphocytes Relative: 20 %
Lymphs Abs: 1.4 10*3/uL (ref 0.7–4.0)
MCH: 19 pg — ABNORMAL LOW (ref 26.0–34.0)
MCHC: 25.8 g/dL — ABNORMAL LOW (ref 30.0–36.0)
MCV: 73.6 fL — ABNORMAL LOW (ref 80.0–100.0)
Monocytes Absolute: 0.6 10*3/uL (ref 0.1–1.0)
Monocytes Relative: 8 %
Neutro Abs: 4.6 10*3/uL (ref 1.7–7.7)
Neutrophils Relative %: 65 %
Platelets: 246 10*3/uL (ref 150–400)
RBC: 4.74 MIL/uL (ref 3.87–5.11)
RDW: 28.2 % — ABNORMAL HIGH (ref 11.5–15.5)
WBC: 7.2 10*3/uL (ref 4.0–10.5)
nRBC: 0 % (ref 0.0–0.2)

## 2023-09-18 LAB — GLUCOSE, CAPILLARY
Glucose-Capillary: 193 mg/dL — ABNORMAL HIGH (ref 70–99)
Glucose-Capillary: 195 mg/dL — ABNORMAL HIGH (ref 70–99)
Glucose-Capillary: 159 mg/dL — ABNORMAL HIGH (ref 70–99)
Glucose-Capillary: 192 mg/dL — ABNORMAL HIGH (ref 70–99)

## 2023-09-18 LAB — BASIC METABOLIC PANEL
Anion gap: 12 (ref 5–15)
BUN: 11 mg/dL (ref 8–23)
CO2: 36 mmol/L — ABNORMAL HIGH (ref 22–32)
Calcium: 8.5 mg/dL — ABNORMAL LOW (ref 8.9–10.3)
Chloride: 88 mmol/L — ABNORMAL LOW (ref 98–111)
Creatinine, Ser: 0.75 mg/dL (ref 0.44–1.00)
GFR, Estimated: >60 mL/min (ref >60)
Glucose, Bld: 184 mg/dL — ABNORMAL HIGH (ref 70–99)
Potassium: 3.2 mmol/L — ABNORMAL LOW (ref 3.5–5.1)
Sodium: 136 mmol/L (ref 135–145)

## 2023-09-18 MED ORDER — POTASSIUM CHLORIDE CRYS ER 20 MEQ PO TBCR
40.0000 meq | EXTENDED_RELEASE_TABLET | Freq: Two times a day (BID) | ORAL | Status: DC
  Administered 2023-09-18: 40 meq via ORAL
  Filled 2023-09-18: qty 2

## 2023-09-18 MED ORDER — SPIRONOLACTONE 25 MG PO TABS
50.0000 mg | ORAL_TABLET | Freq: Every day | ORAL | Status: DC
  Administered 2023-09-18 – 2023-10-01 (×14): 50 mg via ORAL
  Filled 2023-09-18 (×14): qty 2

## 2023-09-18 MED ORDER — OXYCODONE HCL 5 MG PO TABS
10.0000 mg | ORAL_TABLET | Freq: Three times a day (TID) | ORAL | Status: DC | PRN
  Administered 2023-09-18 – 2023-09-20 (×6): 10 mg via ORAL
  Filled 2023-09-18 (×6): qty 2

## 2023-09-18 MED ORDER — FUROSEMIDE 10 MG/ML IJ SOLN
80.0000 mg | Freq: Two times a day (BID) | INTRAMUSCULAR | Status: DC
  Administered 2023-09-18 – 2023-09-19 (×2): 80 mg via INTRAVENOUS
  Filled 2023-09-18 (×2): qty 8

## 2023-09-18 MED ORDER — FUROSEMIDE 10 MG/ML IJ SOLN
40.0000 mg | Freq: Four times a day (QID) | INTRAMUSCULAR | Status: AC
  Administered 2023-09-18 (×2): 40 mg via INTRAVENOUS
  Filled 2023-09-18 (×2): qty 4

## 2023-09-18 MED ORDER — POTASSIUM CHLORIDE CRYS ER 20 MEQ PO TBCR
40.0000 meq | EXTENDED_RELEASE_TABLET | Freq: Three times a day (TID) | ORAL | Status: AC
  Administered 2023-09-18 (×2): 40 meq via ORAL
  Filled 2023-09-18 (×2): qty 2

## 2023-09-18 NOTE — Plan of Care (Signed)
  Problem: Health Behavior/Discharge Planning: Goal: Ability to manage health-related needs will improve Outcome: Progressing   Problem: Clinical Measurements: Goal: Cardiovascular complication will be avoided Outcome: Progressing   Problem: Nutrition: Goal: Adequate nutrition will be maintained Outcome: Progressing   Problem: Coping: Goal: Level of anxiety will decrease Outcome: Progressing   Problem: Safety: Goal: Ability to remain free from injury will improve Outcome: Progressing   Problem: Nutritional: Goal: Maintenance of adequate nutrition will improve Outcome: Progressing

## 2023-09-18 NOTE — Progress Notes (Addendum)
 PROGRESS NOTE    [name]  FMW:[id number] DOB: 12-08-1960 DOA: 09/10/2023 PCP: [name], DO     Brief Narrative:  [name] is a 63 y.o. female with medical history significant of morbid obesity, right AKA, irritable bowel syndrome, HTN, HLD, DM, RA, asthma who presented to ED at her PCPs recommendation with worsening cellulitis of right breast.  Reportedly, patient initially presented to ED on 09/03/2023 and was diagnosed with left lower extremity as well as cellulitis under right breast and was discharged home on cefadroxil .  Patient continued to take medications as prescribed however her cellulitis continued to get worse.  She was seen by her PCP via virtual visit and was advised to come to the emergency department.  She was started on vancomycin .    New events last 24 hours / Subjective: Patient still reported to be short of breath, unable to lay flat.  Denied any chest pain.    Assessment & Plan:   Principal Problem:   Cellulitis Active Problems:   Chronic diastolic CHF (congestive heart failure) (HCC)   Type 2 diabetes mellitus with hyperglycemia, with long-term current use of insulin  (HCC)   CAD S/P percutaneous coronary angioplasty   Iron  deficiency anemia   Mixed connective tissue disease (HCC)   Gastroesophageal reflux disease   Morbid obesity (HCC)   Sepsis (HCC)   Sepsis secondary to ?right breast cellulitis Sepsis present on admission with tachycardia, tachypnea Blood cultures NGTD Nonpurulent in nature, significant edema Switched Vancomycin  --> Rocephin , completed 7 days of antibiotics Nystatin  powder for fungal infection  Multiple chronic wounds including abdomen, left breast, left lower extremity, labia Appreciate wound RN recommendations/wound care Antibiotics as above  Possible acute on chronic diastolic HF Appeared overloaded BNP 106, falsely low in morbidly obese patient Chest x-ray with vascular congestion, slightly  improved Echo with EF of 60 to 65%, left ventricular diastolic parameters are indeterminate Cardiology consulted, appreciate recs Continue diuresis with IV Lasix , start Aldactone  Strict I's and O's, daily weights Daily BMP Supplemental O2 as needed  Hypokalemia Replace as needed  History of asthma/?COPD Patient reports history of asthma Started on Pulmicort , Brovana  Xopenex  ordered, stated Dulera  never worked for her and insist on having albuterol  as needed inhaler Supplemental O2 as needed  Acute hypoxic respiratory failure Acute on Chronic hypercarbic respiratory failure Multifactorial: CHF, OSH, OSA, COPD, possible pulmonary hypertension Noted to be saturating about 79% on room air, now requiring about 4-5L of O2, noted cough Elevated D-dimer, CTA chest negative for acute PE, noted possible atelectasis, cardiomegaly ABG noted metabolic alkalosis, possibly compensatory Chest x-ray as above PCCM consulted, appreciate recs Management as above  CAD Aspirin   Hypertension Continue PTA diltiazem , increased by cardiology Held Imdur  for now  Diabetes mellitus Continue Humulin  R, sliding scale insulin   Iron  deficiency anemia Microcytic anemia Anemia panel showed: Iron  13, TIBC 536, sats 2.4, ferritin 5.3 Folate-->30, vitamin B12-->672 Received 1 dose of IV iron  on 09/13/2023 Continue ferrous sulfate   Hypothyroidism Euthyroid sick syndrome Synthroid  Repeat TSH as outpatient in 4 to 6 weeks  Chronic pain Oxycodone  20 mg every 8 hours as needed, reduced to 10 mg, gabapentin   Likely OSA/obesity hypoventilation syndrome Trial of CPAP/BiPAP at bedtime, unable to tolerate Supplemental O2     DVT prophylaxis: Lovenox    Code Status: Full code Family Communication: None Disposition Plan: Home with home health, Vs SNF Status is: Inpatient Remains inpatient appropriate because: Level of care    Antimicrobials:  Anti-infectives (From admission, onward)  Start      Dose/Rate Route Frequency Ordered Stop   09/11/23 1800  vancomycin  (VANCOCIN ) 1,750 mg in sodium chloride  0.9 % 500 mL IVPB  Status:  Discontinued        1,750 mg 258.8 mL/hr over 120 Minutes Intravenous Every 24 hours 09/10/23 2052 09/11/23 1309   09/11/23 1400  cefTRIAXone  (ROCEPHIN ) 2 g in sodium chloride  0.9 % 100 mL IVPB        2 g 200 mL/hr over 30 Minutes Intravenous Every 24 hours 09/11/23 1309 09/17/23 1015   09/11/23 0015  fluconazole  (DIFLUCAN ) tablet 100 mg  Status:  Discontinued        100 mg Oral Daily 09/10/23 2005 09/11/23 0948   09/10/23 2000  vancomycin  (VANCOCIN ) IVPB 1000 mg/200 mL premix  Status:  Discontinued        1,000 mg 200 mL/hr over 60 Minutes Intravenous  Once 09/10/23 1958 09/11/23 0005   09/10/23 1415  cefTRIAXone  (ROCEPHIN ) 2 g in sodium chloride  0.9 % 100 mL IVPB        2 g 200 mL/hr over 30 Minutes Intravenous  Once 09/10/23 1406 09/10/23 1518   09/10/23 1415  vancomycin  (VANCOCIN ) IVPB 1000 mg/200 mL premix        1,000 mg 200 mL/hr over 60 Minutes Intravenous Every 1 hr x 2 09/10/23 1408 09/10/23 1923        Objective: Vitals:   09/18/23 0500 09/18/23 0620 09/18/23 0942 09/18/23 1201  BP:  (!) 152/80  131/69  Pulse:  100  93  Resp:  20 (!) 22 20  Temp:  98 F (36.7 C)  98.6 F (37 C)  TempSrc:  Oral  Oral  SpO2:  96% 96% 97%  Weight: (!) 145 kg     Height:        Intake/Output Summary (Last 24 hours) at 09/18/2023 1431 Last data filed at 09/18/2023 1216 Gross per 24 hour  Intake 720 ml  Output 1550 ml  Net -830 ml   Filed Weights   09/16/23 0500 09/17/23 0500 09/18/23 0500  Weight: (!) 148.3 kg (!) 145 kg (!) 145 kg    Examination:  General: NAD  Cardiovascular: S1, S2 present Respiratory: Diminished breath sounds bilaterally Abdomen: Soft, nontender, obese, bowel sounds present Musculoskeletal: Noted right AKA  Skin: Multiple wounds noted Psychiatry: Normal mood    Data Reviewed: I have personally reviewed following labs  and imaging studies  CBC: Recent Labs  Lab 09/12/23 0448 09/13/23 0424 09/14/23 0414 09/15/23 0445 09/18/23 0354  WBC 8.7 9.3 8.5 8.2 7.2  NEUTROABS  --  5.9 5.8 5.0 4.6  HGB 7.9* 7.9* 8.0* 8.1* 9.0*  HCT 30.9* 30.4* 30.8* 32.3* 34.9*  MCV 67.9* 68.2* 68.6* 71.1* 73.6*  PLT 297 308 289 253 246   Basic Metabolic Panel: Recent Labs  Lab 09/13/23 1353 09/14/23 0414 09/14/23 0843 09/15/23 0445 09/16/23 0437 09/17/23 0431 09/18/23 0354  NA  --  136  --  136 137 138 136  K  --  3.4*  --  3.9 4.0 3.5 3.2*  CL  --  92*  --  90* 90* 91* 88*  CO2  --  34*  --  34* 36* 36* 36*  GLUCOSE  --  172*  --  194* 199* 188* 184*  BUN  --  10  --  12 13 14 11   CREATININE  --  0.73  --  0.88 0.71 0.80 0.75  CALCIUM   --  8.2*  --  8.2* 8.3* 8.2* 8.5*  MG 1.8  --  1.8  --   --   --   --    GFR: Estimated Creatinine Clearance: 102.2 mL/min (by C-G formula based on SCr of 0.75 mg/dL). Liver Function Tests: No results for input(s): AST, ALT, ALKPHOS, BILITOT, PROT, ALBUMIN in the last 168 hours.  No results for input(s): LIPASE, AMYLASE in the last 168 hours. No results for input(s): AMMONIA in the last 168 hours. Coagulation Profile: No results for input(s): INR, PROTIME in the last 168 hours.  Cardiac Enzymes: No results for input(s): CKTOTAL, CKMB, CKMBINDEX, TROPONINI in the last 168 hours.  BNP (last 3 results) No results for input(s): PROBNP in the last 8760 hours. HbA1C: No results for input(s): HGBA1C in the last 72 hours.  CBG: Recent Labs  Lab 09/17/23 1143 09/17/23 1654 09/17/23 2130 09/18/23 0759 09/18/23 1123  GLUCAP 235* 222* 186* 193* 195*   Lipid Profile: No results for input(s): CHOL, HDL, LDLCALC, TRIG, CHOLHDL, LDLDIRECT in the last 72 hours. Thyroid  Function Tests: No results for input(s): TSH, T4TOTAL, FREET4, T3FREE, THYROIDAB in the last 72 hours.  Anemia Panel: No results for input(s):  VITAMINB12, FOLATE, FERRITIN, TIBC, IRON , RETICCTPCT in the last 72 hours.  Sepsis Labs: No results for input(s): PROCALCITON, LATICACIDVEN in the last 168 hours.  Recent Results (from the past 240 hours)  Blood culture (routine x 2)     Status: None   Collection Time: 09/10/23  2:32 PM   Specimen: BLOOD  Result Value Ref Range Status   Specimen Description   Final    BLOOD RIGHT ANTECUBITAL Performed at [facility], [address] [place], [place] [address]    Special Requests   Final    BOTTLES DRAWN AEROBIC AND ANAEROBIC Blood Culture results may not be optimal due to an inadequate volume of blood received in culture bottles Performed at [facility], [address] [place], [place] [address]    Culture   Final    NO GROWTH 5 DAYS Performed at [facility], [address], [place], [place] [address]    Report Status 09/15/2023 FINAL  Final  Blood culture (routine x 2)     Status: None   Collection Time: 09/10/23  2:34 PM   Specimen: BLOOD  Result Value Ref Range Status   Specimen Description   Final    BLOOD LEFT ANTECUBITAL Performed at [facility], [address] [place], [place] [address]    Special Requests   Final    BOTTLES DRAWN AEROBIC AND ANAEROBIC Blood Culture results may not be optimal due to an inadequate volume of blood received in culture bottles Performed at [facility], [address] [place], [place] [address]    Culture   Final    NO GROWTH 5 DAYS Performed at [facility], [address], [place], [place] [address]    Report Status 09/15/2023 FINAL  Final  Resp panel by RT-PCR (RSV, Flu A&B, Covid) Anterior Nasal Swab     Status: None   Collection Time: 09/10/23  2:59 PM   Specimen: Anterior Nasal Swab  Result Value Ref Range Status   SARS Coronavirus 2 by RT PCR NEGATIVE NEGATIVE Final    Comment: (NOTE) SARS-CoV-2 target nucleic acids  are NOT DETECTED.  The SARS-CoV-2 RNA is generally detectable in upper respiratory specimens during the acute phase of infection. The lowest concentration of SARS-CoV-2 viral copies this  assay can detect is 138 copies/mL. A negative result does not preclude SARS-Cov-2 infection and should not be used as the sole basis for treatment or other patient management decisions. A negative result may occur with  improper specimen collection/handling, submission of specimen other than nasopharyngeal swab, presence of viral mutation(s) within the areas targeted by this assay, and inadequate number of viral copies(<138 copies/mL). A negative result must be combined with clinical observations, patient history, and epidemiological information. The expected result is Negative.  Fact Sheet for Patients:  bloggercourse.com  Fact Sheet for Healthcare Providers:  seriousbroker.it  This test is no t yet approved or cleared by the [place]  FDA and  has been authorized for detection and/or diagnosis of SARS-CoV-2 by FDA under an Emergency Use Authorization (EUA). This EUA will remain  in effect (meaning this test can be used) for the duration of the COVID-19 declaration under Section 564(b)(1) of the Act, 21 U.S.C.section 360bbb-3(b)(1), unless the authorization is terminated  or revoked sooner.       Influenza A by PCR NEGATIVE NEGATIVE Final   Influenza B by PCR NEGATIVE NEGATIVE Final    Comment: (NOTE) The Xpert Xpress SARS-CoV-2/FLU/RSV plus assay is intended as an aid in the diagnosis of influenza from Nasopharyngeal swab specimens and should not be used as a sole basis for treatment. Nasal washings and aspirates are unacceptable for Xpert Xpress SARS-CoV-2/FLU/RSV testing.  Fact Sheet for Patients: bloggercourse.com  Fact Sheet for Healthcare Providers: seriousbroker.it  This test is  not yet approved or cleared by the [place]  FDA and has been authorized for detection and/or diagnosis of SARS-CoV-2 by FDA under an Emergency Use Authorization (EUA). This EUA will remain in effect (meaning this test can be used) for the duration of the COVID-19 declaration under Section 564(b)(1) of the Act, 21 U.S.C. section 360bbb-3(b)(1), unless the authorization is terminated or revoked.     Resp Syncytial Virus by PCR NEGATIVE NEGATIVE Final    Comment: (NOTE) Fact Sheet for Patients: bloggercourse.com  Fact Sheet for Healthcare Providers: seriousbroker.it  This test is not yet approved or cleared by the [place]  FDA and has been authorized for detection and/or diagnosis of SARS-CoV-2 by FDA under an Emergency Use Authorization (EUA). This EUA will remain in effect (meaning this test can be used) for the duration of the COVID-19 declaration under Section 564(b)(1) of the Act, 21 U.S.C. section 360bbb-3(b)(1), unless the authorization is terminated or revoked.  Performed at [facility], [address] [place], [place] [address]   MRSA Next Gen by PCR, Nasal     Status: Abnormal   Collection Time: 09/11/23 12:13 PM   Specimen: Nasal Mucosa; Nasal Swab  Result Value Ref Range Status   MRSA by PCR Next Gen DETECTED (A) NOT DETECTED Final    Comment: RESULT CALLED TO, READ BACK BY AND VERIFIED WITH: RICHDERSON, B. RN AT 1402 ON 09/11/2023 BY MECIAL J. (NOTE) The GeneXpert MRSA Assay (FDA approved for NASAL specimens only), is one component of a comprehensive MRSA colonization surveillance program. It is not intended to diagnose MRSA infection nor to guide or monitor treatment for MRSA infections. Test performance is not FDA approved in patients less than 32 years old. Performed at [facility], [address] [place], [place] [address]       Radiology Studies: DG CHEST  PORT 1 VIEW Result Date: 09/17/2023 CLINICAL DATA:  Respiratory failure EXAM: PORTABLE CHEST 1 VIEW COMPARISON:  09/11/2023 FINDINGS: Cardiomegaly with vascular  congestion, slightly improved. Suspect small right-sided effusion. Increased airspace disease at the right base. No pneumothorax. IMPRESSION: 1. Cardiomegaly with vascular congestion, slightly improved. 2. Increased airspace disease at the right base, atelectasis versus pneumonia. Suspect small right-sided effusion. Electronically Signed   By: Luke Bun M.D.   On: 09/17/2023 16:58   DG Hand 2 View Right Result Date: 09/16/2023 CLINICAL DATA:  Wrist pain.  Ongoing pain to base of thumb. EXAM: RIGHT HAND - 2 VIEW COMPARISON:  02/23/16. FINDINGS: No signs of acute fracture or dislocation. Scattered D IP joint osteoarthritis noted which is most severe at the second DIP joint where there is radial deviation of the distal phalanx. Mild degenerative changes identified at the basilar joint. Soft tissues are unremarkable. IMPRESSION: 1. No acute findings. 2. 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 3. Basilar joint osteoarthritis. Electronically Signed   By: Waddell Calk M.D.   On: 09/16/2023 17:53       Scheduled Meds:  arformoterol   15 mcg Nebulization BID   aspirin  EC  81 mg Oral Daily   budesonide  (PULMICORT ) nebulizer solution  0.25 mg Nebulization BID   Chlorhexidine  Gluconate Cloth  6 each Topical Daily   diltiazem   240 mg Oral Daily   enoxaparin  (LOVENOX ) injection  80 mg Subcutaneous Q24H   ferrous sulfate   325 mg Oral BID WC   fluticasone   1 spray Each Nare Daily   furosemide   40 mg Intravenous Q6H   gabapentin   400 mg Oral BID   gabapentin   800 mg Oral QHS   Gerhardt's butt cream   Topical BID   guaiFENesin   600 mg Oral BID   insulin  aspart  0-20 Units Subcutaneous TID WC   insulin  aspart  0-5 Units Subcutaneous QHS   insulin  regular human CONCENTRATED  5 Units Subcutaneous TID WC    levothyroxine   200 mcg Oral QAC breakfast   mupirocin  ointment   Topical BID   nystatin    Topical TID   pantoprazole   40 mg Oral Daily   potassium chloride   40 mEq Oral TID   pramipexole   1 mg Oral TID   pregabalin   50 mg Oral TID   sodium chloride  flush  3 mL Intravenous Q12H   spironolactone   50 mg Oral Daily   Continuous Infusions:     LOS: 8 days     Lebron JINNY Cage, MD [facility] 09/18/2023, 2:31 PM   Available via Epic secure chat 7am-7pm After these hours, please refer to coverage provider listed on [email or web address]

## 2023-09-18 NOTE — Inpatient Diabetes Management (Signed)
 Inpatient Diabetes Program Recommendations  AACE/ADA: New Consensus Statement on Inpatient Glycemic Control (2015)  Target Ranges:  Prepandial:   less than 140 mg/dL      Peak postprandial:   less than 180 mg/dL (1-2 hours)      Critically ill patients:  140 - 180 mg/dL   Lab Results  Component Value Date   GLUCAP 193 (H) 09/18/2023   HGBA1C 8.0 (H) 09/10/2023    Review of Glycemic Control  Latest Reference Range & Units 09/17/23 07:51 09/17/23 11:43 09/17/23 16:54 09/17/23 21:30 09/18/23 07:59  Glucose-Capillary 70 - 99 mg/dL 805 (H) 764 (H) 777 (H) 186 (H) 193 (H)  (H): Data is abnormally high  Diabetes history: DM2 Outpatient Diabetes medications: U-500 5 TID, metformin  1000 mg BID, has Dexcom CGM  Current orders for Inpatient glycemic control: U-500 5 units TID, Novolog  0-20 units TID & 0-5 units QHS  Inpatient Diabetes Program Recommendations:    Might consider increasing U-500 slightly:  U-500 7 units TID  Will continue to follow while inpatient.  Thank you, Wyvonna Pinal, MSN, CDCES Diabetes Coordinator Inpatient Diabetes Program [phone] (team pager from 8a-5p)

## 2023-09-18 NOTE — Progress Notes (Signed)
 Occupational Therapy Treatment Patient Details Name: [name] MRN: [id number] DOB: 1961-02-20 Today's Date: 09/18/2023   History of present illness [name] is a 63 y.o.presented 09/10/23 from PCP for worsening Right breast cellulitis, LLE cellulitis. PMH: morbid obesity, right AKA, irritable bowel syndrome, hypertension, hyperlipidemia, hypothyroidism, type 2 diabetes mellitus, rheumatoid arthritis, asthma   OT comments  Patient session focused on engagement in self care tasks with increased time for positioning in bed. Patient noted to be on RA upon entrance to room at 76%. Patients nasal cannula reconnected to O2 on 5L/min with return to 93%. Patient remains +2 for movement with increased pain in RUE with attempts. Patient will benefit from continued inpatient follow up therapy, <3 hours/day.  Patient's discharge plan remains appropriate at this time. OT will continue to follow acutely.         If plan is discharge home, recommend the following:  Two people to help with walking and/or transfers;Two people to help with bathing/dressing/bathroom;Assistance with cooking/housework;Direct supervision/assist for medications management;Assist for transportation;Help with stairs or ramp for entrance;Direct supervision/assist for financial management   Equipment Recommendations  None recommended by OT       Precautions / Restrictions Precautions Precautions: Fall Precaution Comments: R AKA, right shoulder pain ? RTC injury/ Restrictions Weight Bearing Restrictions Per Provider Order: No                  ADL either performed or assessed with clinical judgement   ADL Overall ADL's : Needs assistance/impaired     Grooming: Minimal assistance;Sitting Grooming Details (indicate cue type and reason): in bed with pillows added to improve patients sitting posture to upright. patient listing to R side in bed with choosing to keep LLE dangling off EOB. patient was max A for  putting hair in pony tail.         General ADL Comments: patient was sitting in bed without pillow support as noted above with nasal cannula disconnected from wall. patient's O2 as 76% on RA with nasal cannula reconnected with patient comingback up to 93% on 5L/min. patient unable to sustain ro regain to 90s on RA.      Cognition Arousal: Alert Behavior During Therapy: WFL for tasks assessed/performed Overall Cognitive Status: Within Functional Limits for tasks assessed                         Pertinent Vitals/ Pain       Pain Assessment Pain Assessment: Faces Faces Pain Scale: Hurts little more Pain Location: right shoulder, Pain Descriptors / Indicators: Grimacing, Guarding Pain Intervention(s): Limited activity within patient's tolerance, Monitored during session         Frequency  Min 1X/week        Progress Toward Goals  OT Goals(current goals can now be found in the care plan section)  Progress towards OT goals: Not progressing toward goals - comment     Plan         AM-PAC OT 6 Clicks Daily Activity     Outcome Measure   Help from another person eating meals?: A Little Help from another person taking care of personal grooming?: A Little Help from another person toileting, which includes using toliet, bedpan, or urinal?: Total Help from another person bathing (including washing, rinsing, drying)?: Total Help from another person to put on and taking off regular upper body clothing?: A Lot Help from another person to put on and taking off regular lower body  clothing?: Total 6 Click Score: 11    End of Session    OT Visit Diagnosis: Pain;Unsteadiness on feet (R26.81);History of falling (Z91.81)   Activity Tolerance Patient limited by pain;Patient limited by fatigue   Patient Left in bed;with call bell/phone within reach   Nurse Communication Mobility status        Time: 8664-8643 OT Time Calculation (min): 21 min  Charges: OT General  Charges $OT Visit: 1 Visit OT Treatments $Self Care/Home Management : 8-22 mins  Geofm LEYLAND, MS Acute Rehabilitation Department Office# [phone]   Geofm CHRISTELLA Dance 09/18/2023, 3:12 PM

## 2023-09-18 NOTE — TOC Progression Note (Signed)
 Transition of Care [facility]) - Progression Note   Patient Details  Name: [name] MRN: [id number] Date of Birth: 04/11/1961  Transition of Care [facility]) CM/SW Contact  Duwaine GORMAN Aran, LCSW Phone Number: 09/18/2023, 10:38 AM  Clinical Narrative: CSW met with patient to discuss SDOHs. Patient currently has housing and reported she has a fleeta that is used for transportation to appointments, so there are no SDOH needs at this time. Patient reported she is not on home oxygen  at baseline, but is currently on 5L/min. CSW explained that patient may need home oxygen  at discharge if not weaned off. Patient confirmed she still wants to discharge home with [facility], which was set up with [facility].  Expected Discharge Plan: Home w Home Health Services Barriers to Discharge: Continued Medical Work up  Expected Discharge Plan and Services Living arrangements for the past 2 months: Single Family Home  Social Determinants of Health (SDOH) Interventions SDOH Screenings   Food Insecurity: No Food Insecurity (09/11/2023)  Housing: High Risk (09/12/2023)  Transportation Needs: Unmet Transportation Needs (09/11/2023)  Utilities: Not At Risk (09/11/2023)  Alcohol  Screen: Low Risk  (09/10/2023)  Depression (PHQ2-9): Low Risk  (07/10/2023)  Financial Resource Strain: Medium Risk (09/10/2023)  Physical Activity: Unknown (09/10/2023)  Social Connections: Socially Isolated (09/10/2023)  Stress: No Stress Concern Present (09/10/2023)  Tobacco Use: Medium Risk (09/10/2023)   Readmission Risk Interventions    12/23/2021    4:00 PM  Readmission Risk Prevention Plan  Transportation Screening Complete  Medication Review (RN Care Manager) Complete  PCP or Specialist appointment within 3-5 days of discharge Complete  HRI or Home Care Consult Complete  SW Recovery Care/Counseling Consult Complete  Palliative Care Screening Not Applicable  Skilled Nursing Facility Not Applicable

## 2023-09-18 NOTE — Consult Note (Signed)
 Cardiology Consultation   Patient ID: [name] MRN: [id number]; DOB: [birth year]  Admit date: 09/10/2023 Date of Consult: 09/18/2023  PCP:  [name], DO   [facility] Providers Cardiologist:  Wilbert Bihari, MD   Patient Profile:   [name] is a 63 y.o. female with a hx of uncontrolled DM, morbid obesity, HTN, PAF, CAD with prior PCI, right AKA, hyperkalemia with ARB, medication noncompliance, and HFpEF who is being seen 09/18/2023 for the evaluation of persistent hypoxia at the request of Dr. Donnamarie.  History of Present Illness:   [name] underwent heart catheterization 09/2015 following an abnormal nuclear stress test for chest pain that showed obstructive disease in the distal RCA treated with PCI/DES to the mid to distal RCA.  She establish care with Dr. Bihari shortly thereafter.  Repeat heart catheterization 04/2018 with ostial RCA stenosis of 99% treated with PTCA/DES x 1 to the ostial RCA.  Remaining nonobstructive disease treated medically.  She has a history of A-fib with RVR and the setting of sepsis and bacteremia.  TEE 05/2021 showed normal RV function with preserved LVEF.  She converted to NSR and was placed on metoprolol  but was not anticoagulated for CHA2DS2-VASc of 5 due to the brief nature of her V-fib in the setting of acute infection and respiratory failure.  She had subsequent wheezing and asthma and metoprolol  was changed to Cardizem .  Heart monitor 03/2021 showed no evidence of A-fib.  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  She was hospitalized April 2023 with acute on chronic congestive heart failure in the setting of medication noncompliance and left lower extremity cellulitis.  Valsartan  was discontinued due to hyperkalemia.  She was evaluated by AHF 01/2022 who noted inability to assess functional class given immobility s/p right BKA and wheelchair-bound at that time.  She was continued on Lasix  40 mg daily, Arciga  10 mg daily, and spironolactone  25 mg daily.  She has not tolerated ARB due to hyperkalemia.  She does not tolerate beta-blocker due to wheezing in the past.  Supsected a component of OSA and OHS.   She was admitted for LLE and right breast cellulitis after failing outpatient p.o. antibiotics.  She was hypoxic and placed on supplemental O2.  She was started on IV antibiotics.  Unfortunately shortness of breath worsened and was treated with IV Lasix .   Elevated D-dimer 3.05, CTA negative for PE; upper extremity US  negative for DVT  A1c 8% (9.7%) TSH 5.692 (previously in the 9 range) BNP 107  Echocardiogram this admission with LVEF 60-65% with poor visualization of endocardial border, LV diastolic parameters indeterminate, RV difficult to visualize but appeared grossly normal, and no significant valvular disease.   PCCM consulted for persistent hypoxia.  CPAP trial was attempted 09/15/2022 but patient refused.  She remains hypoxic with O2 saturations 77% on room air.  PCCM continued IV diuretic and nocturnal BiPAP.  Likely a component of OSA/OHS.  Cardiology consulted for continued hypoxia despite IV diuresis.  Current regimen includes 50 mg spironolactone , 40 mg IV lasix  BID. Weight stable at 319 lbs.  CXR yesterday with cardiomegaly and slightly improved vascular congestion.   During my interview, she reports persistent costochondritis. She does not have a prosthesis. She uses a wheelchair and hops three steps to get in and out of the bathroom. She has been unable to sleep for a year, but can't tell me if this is due to breathing problems. She reportedly has built up  some upper body strength using a walker for necessary ambulation in the house and recently injured her shoulder. Her left leg became swollen and she was dragging it with the walker.  She then progressed to not being able to move. She report palpitations this admission and in the days preceding presentation to the ER. She states  her breathing is not improved.   Past Medical History:  Diagnosis Date   Aortic stenosis    mild AS by echo 02/2021   Asthma    on daily RX and rescue inhaler (04/18/2018)   Chronic diastolic CHF (congestive heart failure) (HCC) 09/2015   Chronic lower back pain    Chronic neck pain    Chronic pain syndrome    Fentanyl  Patch   Colon polyps    Coronary artery disease    cath with normal LM, 30% LAD, 85% mid RCA and 95% distal RCA s/p PCI of the mid to distal RCA and now on DAPT with ASA and Ticagrelor .     Eczema    Excessive daytime sleepiness 11/26/2015   Fibromyalgia    Gallstones    GERD (gastroesophageal reflux disease)    Heart murmur    noted for the 1st time on 04/18/2018   History of blood transfusion 07/2010   S/P oophorectomy   History of gout    History of hiatal hernia 1980s   gone now (04/18/2018)   Hyperlipidemia    Hypertension    takes Metoprolol  and Enalapril  daily   Hypothyroidism    takes Synthroid  daily   IBS (irritable bowel syndrome)    Migraine    nothing in the 2000s (04/18/2018)   Mixed connective tissue disease (HCC)    NAFLD (nonalcoholic fatty liver disease)    Pneumonia    several times (04/18/2018)   PVC's (premature ventricular contractions)    noted on event monitor 02/2021   Rheumatoid arthritis (HCC)    hands, elbows, shoulders, probably knees (04/18/2018)   Scoliosis    Sjogren's syndrome (HCC)    Sleep apnea    mild - does not use cpap   Spondylosis    Type II diabetes mellitus (HCC)    takes Metformin  and Hum R daily (04/18/2018)   Walker as ambulation aid    also uses wheelchair    Past Surgical History:  Procedure Laterality Date   ABDOMINAL HYSTERECTOMY  06/2005   w/right ovariy   AMPUTATION Right 01/28/2020    RIGHT FOURTH AND FIFTH RAY AMPUTATION   AMPUTATION Right 01/28/2020   Procedure: RIGHT FOURTH AND FIFTH RAY AMPUTATION,;  Surgeon: Harden Jerona GAILS, MD;  Location: [facility] OR;  Service: Orthopedics;  Laterality:  Right;   AMPUTATION Right 06/01/2021   Procedure: RIGHT BELOW KNEE AMPUTATION;  Surgeon: Harden Jerona GAILS, MD;  Location: [facility] OR;  Service: Orthopedics;  Laterality: Right;   AMPUTATION Right 07/22/2021   Procedure: RIGHT ABOVE KNEE AMPUTATION;  Surgeon: Harden Jerona GAILS, MD;  Location: [facility] OR;  Service: Orthopedics;  Laterality: Right;   APPENDECTOMY     BREAST BIOPSY Bilateral    9 total (04/18/2018)   CORONARY ANGIOPLASTY WITH STENT PLACEMENT  10/01/2015   normal LM, 30% LAD, 85% mid RCA and 95% distal RCA s/p PCI of the mid to distal RCA and now on DAPT with ASA and Ticagrelor .     CORONARY STENT INTERVENTION Right 04/18/2018   Procedure: CORONARY STENT INTERVENTION;  Surgeon: Verlin Lonni BIRCH, MD;  Location: [facility] INVASIVE CV LAB;  Service: Cardiovascular;  Laterality: Right;  DILATION AND CURETTAGE OF UTERUS     FRACTURE SURGERY     LAPAROSCOPIC CHOLECYSTECTOMY     LEFT HEART CATH AND CORONARY ANGIOGRAPHY N/A 04/18/2018   Procedure: LEFT HEART CATH AND CORONARY ANGIOGRAPHY;  Surgeon: Verlin Lonni BIRCH, MD;  Location: [facility] INVASIVE CV LAB;  Service: Cardiovascular;  Laterality: N/A;   MUSCLE BIOPSY Left    leg   OOPHORECTOMY  07/2010   RADIOLOGY WITH ANESTHESIA N/A 05/25/2016   Procedure: RADIOLOGY WITH ANESTHESIA;  Surgeon: Medication Radiologist, MD;  Location: [facility] OR;  Service: Radiology;  Laterality: N/A;   STUMP REVISION Right 06/29/2021   Procedure: REVISION RIGHT BELOW KNEE AMPUTATION;  Surgeon: Harden Jerona GAILS, MD;  Location: [facility] OR;  Service: Orthopedics;  Laterality: Right;   TEE WITHOUT CARDIOVERSION N/A 06/01/2021   Procedure: TRANSESOPHAGEAL ECHOCARDIOGRAM (TEE);  Surgeon: Barbaraann Darryle Ned, MD;  Location: [facility] OR;  Service: Cardiovascular;  Laterality: N/A;   TONSILLECTOMY AND ADENOIDECTOMY     WRIST FRACTURE SURGERY Left    crushed it     Home Medications:  Prior to Admission medications   Medication Sig Start Date End Date Taking? Authorizing Provider   acetaminophen  (TYLENOL ) 500 MG tablet Take 1,000 mg by mouth as needed for mild pain (pain score 1-3).   Yes [provider]  albuterol  (PROVENTIL ) (2.5 MG/3ML) 0.083% nebulizer solution USE 1 VIAL IN NEBULIZER EVERY 4 HOURS AS NEEDED FOR WHEEZING FOR SHORTNESS OF BREATH 09/29/22  Yes Wendling, [name], DO  albuterol  (VENTOLIN  HFA) 108 (90 Base) MCG/ACT inhaler INHALE 2 PUFFS BY MOUTH EVERY 6 HOURS AS NEEDED FOR WHEEZING FOR SHORTNESS OF BREATH Patient taking differently: Inhale 2 puffs into the lungs every 4 (four) hours as needed for wheezing or shortness of breath. 04/26/23  Yes [name], DO  aspirin  EC 81 MG tablet Take 81 mg by mouth every evening. 12/22/15  Yes [provider]  augmented betamethasone  dipropionate (DIPROLENE  AF) 0.05 % cream Apply 1 Application topically 2 (two) times daily as needed (rash). 01/30/23  Yes [name], DO  cefadroxil  (DURICEF) 500 MG capsule Take 1 capsule (500 mg total) by mouth 2 (two) times daily. 09/03/23  Yes Raford Lenis, MD  diclofenac  Sodium (PENNSAID ) 2 % SOLN APPLY 2 PUMPS (40MG  TOTAL) TOPICALLY TWICE DAILY AS NEEDED Patient taking differently: Apply topically in the morning and at bedtime. 1 application to back twice daily 08/17/23  Yes Wendling, [name], DO  diclofenac  Sodium (VOLTAREN ) 1 % GEL Apply 2 g topically 4 (four) times daily. Patient taking differently: Apply 1 Application topically 2 (two) times daily as needed (pain). 06/28/23  Yes Raulkar, Sven SQUIBB, MD  DILT-XR 240 MG 24 hr capsule Take 1 capsule (240 mg total) by mouth daily. 06/27/23  Yes Shlomo Wilbert SAUNDERS, MD  diphenhydrAMINE  25 mg in sodium chloride  0.9 % 50 mL Inject 25 mg into the vein as needed (allergy ). 04/05/13  Yes [provider]  EPINEPHrine  0.3 mg/0.3 mL IJ SOAJ injection Inject 0.3 mg into the muscle as needed for anaphylaxis. 09/07/22  Yes [name], DO  fluconazole  (DIFLUCAN ) 150 MG tablet Take 1  tab, repeat in 72 hours if no improvement. 09/10/23  Yes [name], DO  fluticasone  (FLONASE ) 50 MCG/ACT nasal spray USE 2 SPRAY(S) IN EACH NOSTRIL ONCE DAILY AS NEEDED FOR ALLERGIES OR  RHINITIS Patient taking differently: Place 2 sprays into both nostrils in the morning and at bedtime. 04/09/23  Yes [name], DO  furosemide  (LASIX ) 40  MG tablet Take 40 mg by mouth daily as needed for fluid or edema.   Yes [provider]  gabapentin  (NEURONTIN ) 400 MG capsule Take 1 capsule (400 mg total) by mouth 4 (four) times daily. TAKE 1 CAPSULE BY MOUTH IN THE MORNING AND 1 AT NOON AND 2 AT BEDTIME Strength: 400 mg Patient taking differently: Take 400-800 mg by mouth See admin instructions. Take 400 mg by mouth in the morning and the afternoon. Take 800 mg by mouth at bedtime. 05/11/23  Yes Raulkar, Sven SQUIBB, MD  gentamicin ointment (GARAMYCIN) 0.1 % Apply 1 Application topically in the morning and at bedtime. 07/23/23  Yes [provider]  Glucagon , rDNA, (GLUCAGON  EMERGENCY) 1 MG KIT Inject 1 mg into the vein daily as needed. 05/08/23  Yes [name], DO  guaiFENesin  (MUCINEX ) 600 MG 12 hr tablet Take 600 mg by mouth 2 (two) times daily.   Yes [provider]  HUMULIN  R 500 UNIT/ML injection INJECT 0.08-0.3 MLS (40-150 UNITS) INTO THE SKIN THREE TIMES DAILY WITH MEALS Patient taking differently: Inject 5-15 Units into the skin in the morning, at noon, and at bedtime. Takes 5 units three times a day with meals.  Uses SSI on top of the scheduled insulin  with 3-10 units depending on blood sugar level 08/31/23  Yes Wendling, [name], DO  hydrocortisone  cream 1 % Apply 1 application topically 3 (three) times daily as needed for itching (minor skin irritation). Patient taking differently: Apply 1 application  topically in the morning and at bedtime. 06/24/21  Yes Love, Sharlet RAMAN, PA-C  isosorbide  mononitrate (IMDUR ) 60 MG 24 hr tablet Take 1  tablet (60 mg total) by mouth daily. 04/30/23  Yes Swinyer, Rosaline HERO, NP  levocetirizine (XYZAL ) 5 MG tablet TAKE 1 TABLET BY MOUTH ONCE DAILY IN THE EVENING 07/16/23  Yes Wendling, [name], DO  lidocaine  (LIDODERM ) 5 % Place 1 patch onto the skin every 12 (twelve) hours. Remove & Discard patch within 12 hours or as directed Patient taking differently: Place 1 patch onto the skin daily as needed (pain). Remove & Discard patch within 12 hours or as directed 04/12/23  Yes Debby Genre L, NP  lidocaine  (XYLOCAINE ) 5 % ointment Apply 1 Application topically in the morning and at bedtime. 02/02/23  Yes [provider]  Multiple Vitamins-Minerals (WOMENS 50+ MULTI VITAMIN PO) Take 1 tablet by mouth in the morning and at bedtime.   Yes [provider]  mupirocin  ointment (BACTROBAN ) 2 % Apply topically 2 (two) times daily. Patient taking differently: Apply 1 Application topically 2 (two) times daily. 05/08/23  Yes [name], DO  naloxone  (NARCAN ) nasal spray 4 mg/0.1 mL Place 1 spray into the nose as needed (opoid overdose).   Yes [provider]  naproxen sodium (ALEVE) 220 MG tablet Take 220 mg by mouth 2 (two) times daily as needed (pain).   Yes [provider]  nitroGLYCERIN  (NITROSTAT ) 0.4 MG SL tablet DISSOLVE ONE TABLET UNDER THE TONGUE EVERY 5 MINUTES AS NEEDED FOR CHEST PAIN.  DO NOT EXCEED A TOTAL OF 3 DOSES IN 15 MINUTES 08/23/21  Yes Shlomo Wilbert SAUNDERS, MD  omeprazole  (PRILOSEC) 40 MG capsule Take 1 capsule by mouth once daily 08/24/23  Yes Wendling, [name], DO  Oxycodone  HCl 20 MG TABS Take 1 tablet (20 mg total) by mouth every 8 (eight) hours as needed. 08/16/23  Yes Debby Genre CROME, NP  oxymetazoline  (AFRIN) 0.05 % nasal spray Place 2 sprays into  both nostrils 2 (two) times daily as needed for congestion.   Yes [provider]  Polyethyl Glyc-Propyl Glyc PF (SYSTANE PRESERVATIVE FREE) 0.4-0.3 % SOLN Place 2 drops into both eyes  in the morning and at bedtime. 05/11/16  Yes [provider]  pramipexole  (MIRAPEX ) 1 MG tablet Take 1 tablet (1 mg total) by mouth 3 (three) times daily. 08/08/23  Yes Saguier, [place], PA-C  pregabalin  (LYRICA ) 50 MG capsule Take 1 capsule (50 mg total) by mouth 3 (three) times daily. 05/11/23  Yes Raulkar, Sven SQUIBB, MD  promethazine  (PHENERGAN ) 25 MG tablet Take 1 tablet (25 mg total) by mouth every 8 (eight) hours as needed for vomiting or nausea. Patient taking differently: Take 25 mg by mouth as needed for vomiting or nausea. 04/20/23  Yes [name], DO  Continuous Glucose Sensor (DEXCOM G7 SENSOR) MISC USE TO CHECK FOR BLOOD SUGAR CONTINUOUSLY- CHANGE SENSOR EVERY 10 DAYS 05/08/23   [name], DO  Diclofenac  Sodium 3 % GEL Apply 1 Application topically 4 (four) times daily as needed. Patient not taking: Reported on 09/11/2023 05/11/23   Lorilee Sven SQUIBB, MD  INSULIN  SYRINGE 1CC/29G (B-D INSULIN  SYRINGE) 29G X 1/2 1 ML MISC Use three times daily with insulin .  DX E11.9 06/11/20   [name], DO  levothyroxine  (SYNTHROID ) 200 MCG tablet TAKE 1 TABLET BY MOUTH ONCE DAILY BEFORE BREAKFAST 09/17/23   [name], DO  metFORMIN  (GLUCOPHAGE -XR) 500 MG 24 hr tablet Take 2 tablets by mouth twice daily 09/17/23   Wendling, [name], DO  nystatin  (MYCOSTATIN /NYSTOP ) powder Apply 1 Application topically 3 (three) times daily. R breast Patient not taking: Reported on 09/11/2023 09/10/23   [name], DO  OneTouch Delica Lancets 33G MISC Use to check blood glucose up to 4 times a day 11/16/22   [name], DO  [facility] VERIO test strip Use as instructed 06/27/23   [name], DO  Vitamin D , Ergocalciferol , (DRISDOL ) 1.25 MG (50000 UNIT) CAPS capsule Take 1 capsule (50,000 Units total) by mouth once a week. 09/17/23   [name], DO    Inpatient Medications: Scheduled Meds:  arformoterol   15 mcg  Nebulization BID   aspirin  EC  81 mg Oral Daily   budesonide  (PULMICORT ) nebulizer solution  0.25 mg Nebulization BID   Chlorhexidine  Gluconate Cloth  6 each Topical Daily   diltiazem   240 mg Oral Daily   enoxaparin  (LOVENOX ) injection  80 mg Subcutaneous Q24H   ferrous sulfate   325 mg Oral BID WC   fluticasone   1 spray Each Nare Daily   furosemide   40 mg Intravenous Q6H   gabapentin   400 mg Oral BID   gabapentin   800 mg Oral QHS   Gerhardt's butt cream   Topical BID   guaiFENesin   600 mg Oral BID   insulin  aspart  0-20 Units Subcutaneous TID WC   insulin  aspart  0-5 Units Subcutaneous QHS   insulin  regular human CONCENTRATED  5 Units Subcutaneous TID WC   levothyroxine   200 mcg Oral QAC breakfast   mupirocin  ointment   Topical BID   nystatin    Topical TID   pantoprazole   40 mg Oral Daily   potassium chloride   40 mEq Oral TID   pramipexole   1 mg Oral TID   pregabalin   50 mg Oral TID   sodium chloride  flush  3 mL Intravenous Q12H   spironolactone   50 mg Oral Daily   Continuous Infusions:  PRN Meds:  acetaminophen  **OR** acetaminophen , albuterol , ondansetron  **OR** ondansetron  (ZOFRAN ) IV, oxyCODONE , sodium chloride   Allergies:    Allergies  Allergen Reactions   Fish Allergy  Anaphylaxis    INCLUDES OMEGA 3 OILS   Fish Oil Anaphylaxis and Swelling    THROAT SWELLS INCLUDES FISH AS A CLASS   Gabapentin  Anxiety and Other (See Comments)    Anxiety on high doses, Angioedema   Ipratropium-Albuterol  Shortness Of Breath     Bronchospasm, Wheezing becomes worse, can take albuterol  with ipratropium   Omega-3 Fatty Acids Swelling    Throat swelling, Has tolerated Glucerna nutritional drinks     Peanut -Containing Drug Products Shortness Of Breath   Victoza  [Liraglutide ] Nausea And Vomiting    Patient reported severe, debilitating nausea   Lovastatin Other (See Comments)    EXTREME JOINT PAIN   Metronidazole  Nausea And Vomiting and Swelling    Headache and shakes, Pt tolerating  IV 06/14/22    Penicillins Rash and Other (See Comments)    UNSPECIFIED, TOLERATES KEFLEX , CHILDHOOD RX, PCN CHALLENGE PER ALLERGIST, 02/29/16.  RESULTS NOT UPDATED.    Red Yeast Rice [Cholestin] Other (See Comments)    Muscle pain and severe joint pain.    Rosuvastatin  Other (See Comments)    Extreme joint pain, to this & other statins    Rotigotine  Swelling    (Neupro ) Extreme edema    Atrovent Hfa [Ipratropium Bromide Hfa] Other (See Comments)    wheezing   Iodine  Swelling    Pt specifies allergen was Povidone-iodine  10 % swab. The reaction was localized to region (BLE) where topical iodine  was being applied.    Ipratropium Bromide Other (See Comments)    Wheezing becomes worse   Morphine     Other Other (See Comments)    Surgical Staples causes redness/infection per patient.   Pepto-Bismol [Bismuth Subsalicylate] Nausea And Vomiting    Extreme vomiting   Wound Dressing Adhesive Other (See Comments)    Electrodes causes skin breakdown, Electrodes causes skin breakdown   Enalapril  Cough   Metoprolol  Palpitations   Suprep [Na Sulfate-K Sulfate-Mg Sulf] Nausea And Vomiting   Tape Other (See Comments)    Electrodes causes skin breakdown    Social History:   Social History   Socioeconomic History   Marital status: Married    Spouse name: Garrel Tonnesen   Number of children: 1   Years of education: 11   Highest education level: Associate degree: occupational, scientist, product/process development, or vocational program  Occupational History    Comment: Not currrently working ( last worked 2004)  Tobacco Use   Smoking status: Former    Current packs/day: 0.00    Average packs/day: 1 pack/day for 19.4 years (19.4 ttl pk-yrs)    Types: Cigarettes    Start date: 71    Quit date: 02/11/2004    Years since quitting: 19.6    Passive exposure: Past   Smokeless tobacco: Never  Vaping Use   Vaping status: Never Used  Substance and Sexual Activity   Alcohol  use: Yes    Comment: RARE Wine   Drug use: Not  Currently    Types: Marijuana    Comment: only in my teens   Sexual activity: Yes    Birth control/protection: Surgical    Comment: Hysterectomy  Other Topics Concern   Not on file  Social History Narrative   Not on file   Social Drivers of Health   Financial Resource Strain: Medium Risk (09/10/2023)   Overall Financial Resource Strain (CARDIA)    Difficulty of  Paying Living Expenses: Somewhat hard  Food Insecurity: No Food Insecurity (09/11/2023)   Hunger Vital Sign    Worried About Running Out of Food in the Last Year: Never true    Ran Out of Food in the Last Year: Never true  Transportation Needs: Unmet Transportation Needs (09/11/2023)   PRAPARE - Transportation    Lack of Transportation (Medical): Yes    Lack of Transportation (Non-Medical): Yes  Physical Activity: Unknown (09/10/2023)   Exercise Vital Sign    Days of Exercise per Week: 0 days    Minutes of Exercise per Session: Not on file  Stress: No Stress Concern Present (09/10/2023)   [employer] of Occupational Health - Occupational Stress Questionnaire    Feeling of Stress : Not at all  Social Connections: Socially Isolated (09/10/2023)   Social Connection and Isolation Panel [NHANES]    Frequency of Communication with Friends and Family: Once a week    Frequency of Social Gatherings with Friends and Family: Never    Attends Religious Services: Never    Database Administrator or Organizations: No    Attends Engineer, Structural: Not on file    Marital Status: Married  Catering Manager Violence: Not At Risk (09/11/2023)   Humiliation, Afraid, Rape, and Kick questionnaire    Fear of Current or Ex-Partner: No    Emotionally Abused: No    Physically Abused: No    Sexually Abused: No    Family History:    Family History  Problem Relation Age of Onset   CAD Mother    Hypertension Mother    Heart attack Mother    Stroke Mother    CAD Father    Heart attack Father    Allergic rhinitis  Father    Asthma Father    Hypertension Brother    Hypertension Brother    Pancreatic cancer Paternal Aunt    Breast cancer Paternal Aunt    Lung cancer Paternal Aunt    Allergic rhinitis Son    Asthma Son      ROS:  Please see the history of present illness.   All other ROS reviewed and negative.     Physical Exam/Data:   Vitals:   09/18/23 0500 09/18/23 0620 09/18/23 0942 09/18/23 1201  BP:  (!) 152/80  131/69  Pulse:  100  93  Resp:  20 (!) 22 20  Temp:  98 F (36.7 C)  98.6 F (37 C)  TempSrc:  Oral  Oral  SpO2:  96% 96% 97%  Weight: (!) 145 kg     Height:        Intake/Output Summary (Last 24 hours) at 09/18/2023 1357 Last data filed at 09/18/2023 1216 Gross per 24 hour  Intake 920 ml  Output 1550 ml  Net -630 ml      09/18/2023    5:00 AM 09/17/2023    5:00 AM 09/16/2023    5:00 AM  Last 3 Weights  Weight (lbs) 319 lb 10.7 oz 319 lb 10.7 oz 326 lb 14.4 oz  Weight (kg) 145 kg 145 kg 148.281 kg     Body mass index is 57.54 kg/m.  General:  morbidly obese female in NAD, right BKA HEENT: normal Neck: exam difficult Cardiac:  normal S1, S2; RRR; heart sounds distant Lungs:  clear in upper lobes, diminished in bases  Abd: soft, nontender, no hepatomegaly  Ext: right BKA, LLE red, hard, swollen, lower leg wrapped, pulses not felt Skin: warm and dry  Neuro:  CNs 2-12 intact, no focal abnormalities noted Psych:  Normal affect   EKG:  The EKG was personally reviewed and demonstrates:  widened QRS without discernable p waves, will repeat EKG Telemetry:  Telemetry was personally reviewed and demonstrates:  appears sinus rhythm with prolonged PR and PACs  Relevant CV Studies:  Echo 09/11/22: 1. Left ventricular ejection fraction, by estimation, is 60 to 65%. The  left ventricle has normal function. Left ventricular endocardial border  not optimally defined to evaluate regional wall motion. Left ventricular  diastolic parameters are  indeterminate.   2. RV not well  visualized, grossly appears normal in size and function. .  Right ventricular systolic function was not well visualized. The right  ventricular size is not well visualized. Tricuspid regurgitation signal is  inadequate for assessing PA  pressure.   3. The mitral valve was not well visualized. No evidence of mitral valve  regurgitation. No evidence of mitral stenosis.   4. The aortic valve was not well visualized. Aortic valve regurgitation  is not visualized. No aortic stenosis is present.   5. Technically difficult study, poor visualization.   Laboratory Data:  High Sensitivity Troponin:   Recent Labs  Lab 09/10/23 1434  TROPONINIHS 17     Chemistry Recent Labs  Lab 09/13/23 1353 09/14/23 0414 09/14/23 0843 09/15/23 0445 09/16/23 0437 09/17/23 0431 09/18/23 0354  NA  --    < >  --    < > 137 138 136  K  --    < >  --    < > 4.0 3.5 3.2*  CL  --    < >  --    < > 90* 91* 88*  CO2  --    < >  --    < > 36* 36* 36*  GLUCOSE  --    < >  --    < > 199* 188* 184*  BUN  --    < >  --    < > 13 14 11   CREATININE  --    < >  --    < > 0.71 0.80 0.75  CALCIUM   --    < >  --    < > 8.3* 8.2* 8.5*  MG 1.8  --  1.8  --   --   --   --   GFRNONAA  --    < >  --    < > >60 >60 >60  ANIONGAP  --    < >  --    < > 11 11 12    < > = values in this interval not displayed.    No results for input(s): PROT, ALBUMIN, AST, ALT, ALKPHOS, BILITOT in the last 168 hours. Lipids No results for input(s): CHOL, TRIG, HDL, LABVLDL, LDLCALC, CHOLHDL in the last 168 hours.  Hematology Recent Labs  Lab 09/14/23 0414 09/15/23 0445 09/18/23 0354  WBC 8.5 8.2 7.2  RBC 4.49 4.54 4.74  HGB 8.0* 8.1* 9.0*  HCT 30.8* 32.3* 34.9*  MCV 68.6* 71.1* 73.6*  MCH 17.8* 17.8* 19.0*  MCHC 26.0* 25.1* 25.8*  RDW 24.6* 25.2* 28.2*  PLT 289 253 246   Thyroid  No results for input(s): TSH, FREET4 in the last 168 hours.  BNP Recent Labs  Lab 09/12/23 0755  BNP 106.8*    DDimer   Recent Labs  Lab 09/13/23 1353  DDIMER 3.05*     Radiology/Studies:  DG CHEST PORT 1 VIEW Result Date:  09/17/2023 CLINICAL DATA:  Respiratory failure EXAM: PORTABLE CHEST 1 VIEW COMPARISON:  09/11/2023 FINDINGS: Cardiomegaly with vascular congestion, slightly improved. Suspect small right-sided effusion. Increased airspace disease at the right base. No pneumothorax. IMPRESSION: 1. Cardiomegaly with vascular congestion, slightly improved. 2. Increased airspace disease at the right base, atelectasis versus pneumonia. Suspect small right-sided effusion. Electronically Signed   By: Luke Bun M.D.   On: 09/17/2023 16:58   DG Hand 2 View Right Result Date: 09/16/2023 CLINICAL DATA:  Wrist pain.  Ongoing pain to base of thumb. EXAM: RIGHT HAND - 2 VIEW COMPARISON:  02/23/16. FINDINGS: No signs of acute fracture or dislocation. Scattered D IP joint osteoarthritis noted which is most severe at the second DIP joint where there is radial deviation of the distal phalanx. Mild degenerative changes identified at the basilar joint. Soft tissues are unremarkable. IMPRESSION: 1. No acute findings. 2. 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 3. Basilar joint osteoarthritis. Electronically Signed   By: Waddell Calk M.D.   On: 09/16/2023 17:53     Assessment and Plan:   Acute on chronic diastolic heart failure - has diuresed with IV lasix  40 mg BID and 50 mg spironolactone  - echo this admission with preserved LVEF, not clear images of RV, but normal RV function on TEE in 2022 - may ultimately need a RHC to determine if pulmonary hypertension contirbuting and heart pressures - GDMT: no ARB due to hyperkalemia, but tolerating spironolactone , no BB due to wheezing - exam very difficult - unclear volume status   Persistent hypoxia - continues to require O2 despite IV diuresis - echo this admission as above - volume status difficult given body habitus - suspect a  component of OSA and OHS, she has not tolerated CPAP, likely a component of PH - may need RHC as above, may be nearing chronic O2 need   PAF - EKG without discernable p waves, question junctional rhythm vs sinus with long PR and PACs - telemetry with evidence of SR with long PR and PACs - will repeat EKG - given high stroke risk, if recurrence of Afib, would likely need OAC - cardizem  increased to 240 mg this admission - avoid BB due to wheezing - reports recent palpitations   CAD - prior PCI/DES to distal RCA and ostial RCA, last ischemic evaluation  - she denies chest pain - continue ASA - does not appear she followed up for PCSK9i, she prefers to avoid statins   Chest pain - persistent MSK pain with hx of using upper body to left/hop using one leg and a walker - tender to palpation on exam   Uncontrolled DM Elevated TSH Hx of medication noncompliance    Risk Assessment/Risk Scores:        New [place]  Heart Association (NYHA) Functional Class NYHA Class IV  CHA2DS2-VASc Score = 5   This indicates a 7.2% annual risk of stroke. The patient's score is based upon: CHF History: 1 HTN History: 1 Diabetes History: 1 Stroke History: 0 Vascular Disease History: 1 Age Score: 0 Gender Score: 1       For questions or updates, please contact [facility] Please consult [email or web address] for contact info under    Signed, Jon Nat Hails, PA  09/18/2023 1:57 PM

## 2023-09-19 ENCOUNTER — Other Ambulatory Visit (HOSPITAL_COMMUNITY): Payer: Self-pay

## 2023-09-19 ENCOUNTER — Telehealth (HOSPITAL_COMMUNITY): Payer: Self-pay | Admitting: Pharmacy Technician

## 2023-09-19 DIAGNOSIS — Z9861 Coronary angioplasty status: Secondary | ICD-10-CM | POA: Diagnosis not present

## 2023-09-19 DIAGNOSIS — I5033 Acute on chronic diastolic (congestive) heart failure: Secondary | ICD-10-CM | POA: Diagnosis not present

## 2023-09-19 DIAGNOSIS — E1165 Type 2 diabetes mellitus with hyperglycemia: Secondary | ICD-10-CM

## 2023-09-19 DIAGNOSIS — I480 Paroxysmal atrial fibrillation: Secondary | ICD-10-CM

## 2023-09-19 DIAGNOSIS — N61 Mastitis without abscess: Secondary | ICD-10-CM

## 2023-09-19 DIAGNOSIS — I2510 Atherosclerotic heart disease of native coronary artery without angina pectoris: Secondary | ICD-10-CM

## 2023-09-19 DIAGNOSIS — L0390 Cellulitis, unspecified: Secondary | ICD-10-CM

## 2023-09-19 DIAGNOSIS — J9621 Acute and chronic respiratory failure with hypoxia: Secondary | ICD-10-CM

## 2023-09-19 DIAGNOSIS — K219 Gastro-esophageal reflux disease without esophagitis: Secondary | ICD-10-CM

## 2023-09-19 DIAGNOSIS — A419 Sepsis, unspecified organism: Secondary | ICD-10-CM

## 2023-09-19 DIAGNOSIS — L03116 Cellulitis of left lower limb: Secondary | ICD-10-CM | POA: Diagnosis not present

## 2023-09-19 DIAGNOSIS — Z794 Long term (current) use of insulin: Secondary | ICD-10-CM

## 2023-09-19 DIAGNOSIS — J9622 Acute and chronic respiratory failure with hypercapnia: Secondary | ICD-10-CM

## 2023-09-19 LAB — CBC WITH DIFFERENTIAL/PLATELET
Abs Immature Granulocytes: 0.04 K/uL (ref 0.00–0.07)
Basophils Absolute: 0.1 K/uL (ref 0.0–0.1)
Basophils Relative: 1 %
Eosinophils Absolute: 0.3 K/uL (ref 0.0–0.5)
Eosinophils Relative: 5 %
HCT: 37.9 % (ref 36.0–46.0)
Hemoglobin: 9.3 g/dL — ABNORMAL LOW (ref 12.0–15.0)
Immature Granulocytes: 1 %
Lymphocytes Relative: 19 %
Lymphs Abs: 1.3 K/uL (ref 0.7–4.0)
MCH: 18.6 pg — ABNORMAL LOW (ref 26.0–34.0)
MCHC: 24.5 g/dL — ABNORMAL LOW (ref 30.0–36.0)
MCV: 76 fL — ABNORMAL LOW (ref 80.0–100.0)
Monocytes Absolute: 0.5 K/uL (ref 0.1–1.0)
Monocytes Relative: 7 %
Neutro Abs: 4.6 K/uL (ref 1.7–7.7)
Neutrophils Relative %: 67 %
Platelets: 280 K/uL (ref 150–400)
RBC: 4.99 MIL/uL (ref 3.87–5.11)
RDW: 29.2 % — ABNORMAL HIGH (ref 11.5–15.5)
WBC: 6.7 K/uL (ref 4.0–10.5)
nRBC: 0 % (ref 0.0–0.2)

## 2023-09-19 LAB — BASIC METABOLIC PANEL WITH GFR
Anion gap: 12 (ref 5–15)
BUN: 10 mg/dL (ref 8–23)
CO2: 36 mmol/L — ABNORMAL HIGH (ref 22–32)
Calcium: 8.4 mg/dL — ABNORMAL LOW (ref 8.9–10.3)
Chloride: 85 mmol/L — ABNORMAL LOW (ref 98–111)
Creatinine, Ser: 0.78 mg/dL (ref 0.44–1.00)
GFR, Estimated: >60 mL/min
Glucose, Bld: 172 mg/dL — ABNORMAL HIGH (ref 70–99)
Potassium: 3.8 mmol/L (ref 3.5–5.1)
Sodium: 133 mmol/L — ABNORMAL LOW (ref 135–145)

## 2023-09-19 LAB — GLUCOSE, CAPILLARY
Glucose-Capillary: 185 mg/dL — ABNORMAL HIGH (ref 70–99)
Glucose-Capillary: 237 mg/dL — ABNORMAL HIGH (ref 70–99)
Glucose-Capillary: 189 mg/dL — ABNORMAL HIGH (ref 70–99)
Glucose-Capillary: 189 mg/dL — ABNORMAL HIGH (ref 70–99)

## 2023-09-19 MED ORDER — FUROSEMIDE 10 MG/ML IJ SOLN
80.0000 mg | Freq: Two times a day (BID) | INTRAMUSCULAR | Status: DC
  Administered 2023-09-19 – 2023-09-22 (×6): 80 mg via INTRAVENOUS
  Filled 2023-09-19 (×6): qty 8

## 2023-09-19 MED ORDER — DILTIAZEM HCL ER COATED BEADS 180 MG PO CP24
300.0000 mg | ORAL_CAPSULE | Freq: Every day | ORAL | Status: DC
  Administered 2023-09-20 – 2023-09-27 (×8): 300 mg via ORAL
  Filled 2023-09-19 (×8): qty 1

## 2023-09-19 MED ORDER — METOLAZONE 5 MG PO TABS
5.0000 mg | ORAL_TABLET | Freq: Once | ORAL | Status: AC
  Administered 2023-09-19: 5 mg via ORAL
  Filled 2023-09-19: qty 1

## 2023-09-19 MED ORDER — APIXABAN 5 MG PO TABS
5.0000 mg | ORAL_TABLET | Freq: Two times a day (BID) | ORAL | Status: DC
  Administered 2023-09-19 – 2023-10-01 (×24): 5 mg via ORAL
  Filled 2023-09-19 (×24): qty 1

## 2023-09-19 MED ORDER — LIDOCAINE 5 % EX PTCH
2.0000 | MEDICATED_PATCH | Freq: Every day | CUTANEOUS | Status: DC
Start: 2023-09-19 — End: 2023-10-01
  Administered 2023-09-19 – 2023-09-25 (×7): 2 via TRANSDERMAL
  Filled 2023-09-19 (×12): qty 2

## 2023-09-19 MED ORDER — POTASSIUM CHLORIDE CRYS ER 20 MEQ PO TBCR
20.0000 meq | EXTENDED_RELEASE_TABLET | Freq: Once | ORAL | Status: AC
  Administered 2023-09-19: 20 meq via ORAL
  Filled 2023-09-19: qty 1

## 2023-09-19 NOTE — Progress Notes (Signed)
 NAME:  [name], MRN:  [id number], DOB:  09/13/1960, LOS: 9 ADMISSION DATE:  09/10/2023, CONSULTATION DATE:  1/6 REFERRING MD:  Donnamarie, CHIEF COMPLAINT:  dyspnea    History of Present Illness:  36 yof admitted 12/30 for evaluation of left lower extremity cellulitis and cellulitis under right breast. Also has chronic wounds to mult areas including abd, left lower leg, left labia and left breast. Initially seen and discharged 12/23 w/ same dx and treated w/ Cefadroxil .  On ER arrival found to also be tachycardic, tachypneic and w/ room air pulse ox 89%. He had CT abd to further eval abd pain this showed additional Sharp edema at the abd wall, gluteal region and right breast. CT chest showed right basilar atx and sm effusion.  Hospital course  Admitted 12/30 , placed on supplemental oxygen , cultures sent   Started on IV antibiotics  On 12/31 reporting increased SOB-->treated w/ lasix  1/1 still short of breath ECHO showed nml EF. Lasix  increased 1/2 diuretics continued.  1/3 mild improvement but still SOB. CT chest small right effusion. Pulm edema Right basilar airspace disease  1/5 trial of CPAP pt refused 1/6 room air pulse ox 77% more short of breath had to inc [place] support and PCCM asked to eval  Pertinent  Medical History  HTN, CAD s/p prior DES to mid and distal RCA, IBD, MO (BMI 54) right AKA, HLD, DM, RA asthma, chronic dyspnea, PAF   Significant Hospital Events: Including procedures, antibiotic start and stop dates in addition to other pertinent events   Hospital course  Admitted 12/30 , placed on supplemental oxygen , cultures sent   Started on IV antibiotics  On 12/31 reporting increased SOB-->treated w/ lasix  1/1 still short of breath ECHO showed nml EF. Lasix  increased 1/2 diuretics continued.  1/3 mild improvement but still SOB. CT chest small right effusion. Pulm edema Right basilar airspace disease  1/5 trial of CPAP pt refused 1/6 room air pulse ox 77% more short of  breath had to inc [place] support and PCCM asked to eval   Interim History / Subjective:  No acute events, states she fells better but now reports some productive cough   Objective   Blood pressure 126/72, pulse 94, temperature 98.8 F (37.1 C), temperature source Oral, resp. rate 17, height 5' 2.5 (1.588 m), weight (!) 143.4 kg, SpO2 94%.        Intake/Output Summary (Last 24 hours) at 09/19/2023 1019 Last data filed at 09/19/2023 0946 Gross per 24 hour  Intake 480 ml  Output 3600 ml  Net -3120 ml   Filed Weights   09/17/23 0500 09/18/23 0500 09/19/23 0500  Weight: (!) 145 kg (!) 145 kg (!) 143.4 kg    Examination: General: Acute on chronically ill appearing obese middle aged female lying in bed, in NAD HEENT: [place]/AT, MM pink/moist, PERRL,  Neuro: Alert and oriented x3, non-focal  CV: s1s2 regular rate and rhythm, no murmur, rubs, or gallops,  PULM:  Diminished bilaterally, no increased work of breathing, on 5L Fisher GI: soft, bowel sounds active in all 4 quadrants, non-tender, non-distended, tolerating oral diet Extremities: warm/dry, no edema  Skin: no rashes or lesions  Resolved Hospital Problem list     Assessment & Plan:  Acute on chronic hypoxic resp failure Pulmonary edema Small right effusion  Right basilar atelectasis  Acute diastolic HF Morbid obesity  Probable OSA Prob OHS HTN HLD DM RA   Pulm prob list  Acute on chronic hypoxic and hypercapnic  respiratory failure 2/2 pulmonary edema w/ small right effusion in setting of acute diastolic HF from decompensated OHS/OSA -Suspect that she has been chronically hypoxic and likely type III PH who acutely presents w/ acute cor pulmonale and decomp diastolic HF in setting of untreated OSA/OHS.  Asthma P: Ongoing diureses, net negative ~12L thus for admission  Continue supplemental oxygen  for sat goal greater than 90 Continue BIPAP during day rest and at night  Given significant hypercapnia on ABG there may be a  chronic component and she need for NIV at discharge Continue BDs Will need outpatient follow up with pulmonary and sleep study   Best Practice (right click and Reselect all SmartList Selections daily)   Per primary   Critical care time: NA  Giovana Faciane D. Harris, NP-C [facility] Personal contact information can be found on Amion  If no contact or response made please call [phone] 09/19/2023, 10:19 AM

## 2023-09-19 NOTE — Progress Notes (Signed)
   09/19/23 0023  BiPAP/CPAP/SIPAP  BiPAP/CPAP/SIPAP Pt Type Adult (machine plugged into red outlet)  BiPAP/CPAP/SIPAP DREAMSTATIOND  Mask Type Full face mask  Mask Size Medium  Flow Rate 5 lpm (5Lpm O2 bled into circuit)  Patient Home Equipment No  Auto Titrate Yes (AutoB: Emin-5, Imax-20 cm H2O.  PS Range= 5-8 cm H2O)  CPAP/SIPAP surface wiped down Yes

## 2023-09-19 NOTE — Plan of Care (Signed)
  Problem: Nutrition: Goal: Adequate nutrition will be maintained Outcome: Progressing   Problem: Coping: Goal: Level of anxiety will decrease Outcome: Progressing   Problem: Safety: Goal: Ability to remain free from injury will improve Outcome: Progressing   Problem: Coping: Goal: Ability to adjust to condition or change in health will improve Outcome: Progressing   Problem: Nutritional: Goal: Maintenance of adequate nutrition will improve Outcome: Progressing

## 2023-09-19 NOTE — Progress Notes (Signed)
 PROGRESS NOTE   [name]  FMW:[id number] DOB: [birth year] DOA: 09/10/2023 PCP: [name], DO   Date of Service: the patient was seen and examined on 09/19/2023  Brief Narrative:  63 y.o. female with medical history significant of morbid obesity, right AKA, irritable bowel syndrome, HTN, HLD, DM, RA, asthma who presented to ED at her PCPs recommendation with worsening cellulitis of right breast.  Of note, patient initially presented to ED on 09/03/2023 and was diagnosed with left lower extremity as well as cellulitis under right breast and was discharged home on cefadroxil .    Due to patient's worsening symptoms of the right breast and left lower extremity patient presented to [facility] emergency department on 12/30.  Upon evaluation in the emergency department patient was found to exhibit multiple SIRS criteria with concerns for developing sepsis.  Patient was initiated on intravenous antibiotics and the hospitalist group was then called to assess the patient for admission to the hospital.  SIRS criteria eventually improved with intravenous antibiotic therapy and patient was switched from intravenous vancomycin  to Rocephin  with completion of 7 days of antibiotics while hospitalized.  Patient was noted to have multiple chronic wounds for which the wound care service assisted in wound care recommendations.  Hospital course was complicated by progressive hypoxia thought to be secondary to increasing volume overload of the course of the hospitalization.  Cardiology consult was obtained and it was felt that patient was likely suffering from acute diastolic congestive heart failure.  He was initiated on intravenous diuretics with some symptomatic improvement.   Furthermore, patient was also found to have intermittent bouts of paroxysmal atrial fibrillation throughout the hospitalization as well with no known history of atrial fibrillation in the past.  Patient was  initiated on Eliquis  5 mg twice daily.  Rate control was managed with Cardizem .     Assessment & Plan Cellulitis of left lower extremity Antibiotic course complete Cellulitis of right breast Antibiotic course complete Sepsis due to cellulitis (HCC) Sepsis resolved Acute on chronic respiratory failure with hypoxia and hypercapnia (HCC) Thought to be secondary to acute on chronic diastolic congestive heart failure Continuing to treat with intravenous diuretics Acute on chronic diastolic CHF (congestive heart failure) [facility]) Cardiology following, their assistance is appreciated Continuing Lasix  80 mg IV twice daily Monitoring renal function and electrolytes with serial chemistries Paroxysmal atrial fibrillation (HCC) Diltiazem  daily for rate control Eliquis  for anticoagulation Cardiology following their input is appreciated. Type 2 diabetes mellitus with hyperglycemia, with long-term current use of insulin  (HCC) Patient been placed on Accu-Cheks before every meal and nightly with sliding scale insulin  Continue to provide patient with U-500 5 units 3 times daily Hemoglobin A1C 8.0% Diabetic Diet  CAD S/P percutaneous coronary angioplasty No clinical evidence of ischemia Patient being evaluated for PCSK9 inhibitor as outpatient Discontinuing aspirin  as patient is now on Eliquis  for paroxysmal intrafibrillation. Gastroesophageal reflux disease Continue Protonix      Subjective:  Patient complaining of continued mild shortness of breath and ongoing cough with intermittent production of sputum.  Physical Exam:  Vitals:   09/18/23 2008 09/19/23 0350 09/19/23 0500 09/19/23 0756  BP: 113/63 126/72    Pulse: (!) 108 94    Resp: 18 17    Temp: 98.6 F (37 C) 98.8 F (37.1 C)    TempSrc: Oral Oral    SpO2: 96% 100%  94%  Weight:   (!) 143.4 kg   Height:         Constitutional: Awake alert  and oriented x3, no associated distress.  Patient is obese. Skin: no rashes, no  lesions, good skin turgor noted. Eyes: Pupils are equally reactive to light.  No evidence of scleral icterus or conjunctival pallor.  ENMT: Moist mucous membranes noted.  Posterior pharynx clear of any exudate or lesions.   Respiratory: clear to auscultation bilaterally, no wheezing, no crackles. Normal respiratory effort. No accessory muscle use.  Cardiovascular: Regular rate and rhythm, no murmurs / rubs / gallops. No extremity edema. 2+ pedal pulses. No carotid bruits.  Abdomen: Sig Ficken pitting edema of the anterior abdominal wall.  It is firm.  No evidence of intra-abdominal masses.  Positive bowel sounds noted in all quadrants.   Musculoskeletal: No joint deformity upper and lower extremities. Good ROM, no contractures. Normal muscle tone.    Data Reviewed:  I have personally reviewed and interpreted labs, imaging.  Significant findings are   CBC: Recent Labs  Lab 09/13/23 0424 09/14/23 0414 09/15/23 0445 09/18/23 0354 09/19/23 0357  WBC 9.3 8.5 8.2 7.2 6.7  NEUTROABS 5.9 5.8 5.0 4.6 4.6  HGB 7.9* 8.0* 8.1* 9.0* 9.3*  HCT 30.4* 30.8* 32.3* 34.9* 37.9  MCV 68.2* 68.6* 71.1* 73.6* 76.0*  PLT 308 289 253 246 280   Basic Metabolic Panel: Recent Labs  Lab 09/13/23 1353 09/14/23 0414 09/14/23 0843 09/15/23 0445 09/16/23 0437 09/17/23 0431 09/18/23 0354 09/19/23 0357  NA  --    < >  --  136 137 138 136 133*  K  --    < >  --  3.9 4.0 3.5 3.2* 3.8  CL  --    < >  --  90* 90* 91* 88* 85*  CO2  --    < >  --  34* 36* 36* 36* 36*  GLUCOSE  --    < >  --  194* 199* 188* 184* 172*  BUN  --    < >  --  12 13 14 11 10   CREATININE  --    < >  --  0.88 0.71 0.80 0.75 0.78  CALCIUM   --    < >  --  8.2* 8.3* 8.2* 8.5* 8.4*  MG 1.8  --  1.8  --   --   --   --   --    < > = values in this interval not displayed.   EKG/Telemetry: Personally reviewed.  Intermittent bouts of atrial fibrillation.   Code Status:  Full code.  Code status decision has been confirmed with:  patient 3   Severity of Illness:  The appropriate patient status for this patient is INPATIENT. Inpatient status is judged to be reasonable and necessary in order to provide the required intensity of service to ensure the patient's safety. The patient's presenting symptoms, physical exam findings, and initial radiographic and laboratory data in the context of their chronic comorbidities is felt to place them at high risk for further clinical deterioration. Furthermore, it is not anticipated that the patient will be medically stable for discharge from the hospital within 2 midnights of admission.   * I certify that at the point of admission it is my clinical judgment that the patient will require inpatient hospital care spanning beyond 2 midnights from the point of admission due to high intensity of service, high risk for further deterioration and high frequency of surveillance required.*  Time spent:  50 minutes  Author:  Zachary JINNY Ba MD  09/19/2023 10:23 AM

## 2023-09-19 NOTE — Progress Notes (Signed)
 PHARMACY - ANTICOAGULATION CONSULT NOTE  Pharmacy Consult for apixaban  Indication: Atrial fibrillation  Allergies  Allergen Reactions   Fish Allergy  Anaphylaxis    INCLUDES OMEGA 3 OILS   Fish Oil Anaphylaxis and Swelling    THROAT SWELLS INCLUDES FISH AS A CLASS   Gabapentin  Anxiety and Other (See Comments)    Anxiety on high doses, Angioedema   Ipratropium-Albuterol  Shortness Of Breath     Bronchospasm, Wheezing becomes worse, can take albuterol  with ipratropium   Omega-3 Fatty Acids Swelling    Throat swelling, Has tolerated Glucerna nutritional drinks     Peanut -Containing Drug Products Shortness Of Breath   Victoza  [Liraglutide ] Nausea And Vomiting    Patient reported severe, debilitating nausea   Lovastatin Other (See Comments)    EXTREME JOINT PAIN   Metronidazole  Nausea And Vomiting and Swelling    Headache and shakes, Pt tolerating IV 06/14/22    Penicillins Rash and Other (See Comments)    UNSPECIFIED, TOLERATES KEFLEX , CHILDHOOD RX, PCN CHALLENGE PER ALLERGIST, 02/29/16.  RESULTS NOT UPDATED.    Red Yeast Rice [Cholestin] Other (See Comments)    Muscle pain and severe joint pain.    Rosuvastatin  Other (See Comments)    Extreme joint pain, to this & other statins    Rotigotine  Swelling    (Neupro ) Extreme edema    Atrovent Hfa [Ipratropium Bromide Hfa] Other (See Comments)    wheezing   Iodine  Swelling    Pt specifies allergen was Povidone-iodine  10 % swab. The reaction was localized to region (BLE) where topical iodine  was being applied.    Ipratropium Bromide Other (See Comments)    Wheezing becomes worse   Morphine     Other Other (See Comments)    Surgical Staples causes redness/infection per patient.   Pepto-Bismol [Bismuth Subsalicylate] Nausea And Vomiting    Extreme vomiting   Wound Dressing Adhesive Other (See Comments)    Electrodes causes skin breakdown, Electrodes causes skin breakdown   Enalapril  Cough   Metoprolol  Palpitations   Suprep [Na  Sulfate-K Sulfate-Mg Sulf] Nausea And Vomiting   Tape Other (See Comments)    Electrodes causes skin breakdown    Patient Measurements: Height: 5' 2.5 (158.8 cm) Weight: (!) 143.4 kg (316 lb 2.2 oz) IBW/kg (Calculated) : 51.25  Vital Signs: Temp: 98.8 F (37.1 C) (01/08 0350) Temp Source: Oral (01/08 0350) BP: 126/72 (01/08 0350) Pulse Rate: 94 (01/08 0350)  Labs: Recent Labs    09/17/23 0431 09/18/23 0354 09/19/23 0357  HGB  --  9.0* 9.3*  HCT  --  34.9* 37.9  PLT  --  246 280  CREATININE 0.80 0.75 0.78    Estimated Creatinine Clearance: 101.4 mL/min (by C-G formula based on SCr of 0.78 mg/dL).   Medical History: Past Medical History:  Diagnosis Date   Aortic stenosis    mild AS by echo 02/2021   Asthma    on daily RX and rescue inhaler (04/18/2018)   Chronic diastolic CHF (congestive heart failure) (HCC) 09/2015   Chronic lower back pain    Chronic neck pain    Chronic pain syndrome    Fentanyl  Patch   Colon polyps    Coronary artery disease    cath with normal LM, 30% LAD, 85% mid RCA and 95% distal RCA s/p PCI of the mid to distal RCA and now on DAPT with ASA and Ticagrelor .     Eczema    Excessive daytime sleepiness 11/26/2015   Fibromyalgia    Gallstones  GERD (gastroesophageal reflux disease)    Heart murmur    noted for the 1st time on 04/18/2018   History of blood transfusion 07/2010   S/P oophorectomy   History of gout    History of hiatal hernia 1980s   gone now (04/18/2018)   Hyperlipidemia    Hypertension    takes Metoprolol  and Enalapril  daily   Hypothyroidism    takes Synthroid  daily   IBS (irritable bowel syndrome)    Migraine    nothing in the 2000s (04/18/2018)   Mixed connective tissue disease (HCC)    NAFLD (nonalcoholic fatty liver disease)    Pneumonia    several times (04/18/2018)   PVC's (premature ventricular contractions)    noted on event monitor 02/2021   Rheumatoid arthritis (HCC)    hands, elbows, shoulders,  probably knees (04/18/2018)   Scoliosis    Sjogren's syndrome (HCC)    Sleep apnea    mild - does not use cpap   Spondylosis    Type II diabetes mellitus (HCC)    takes Metformin  and Hum R daily (04/18/2018)   Walker as ambulation aid    also uses wheelchair    Medications: No anticoagulants PTA -Pt currently on enoxaparin  for DVT prophylaxis inpatient  Assessment: Pt is a 62 yoF with extensive PMH including CAD, HF, and iron  deficient anemia. Cardiology is following and initiating apixaban  for atrial fibrillation. Pharmacy consulted to dose apixaban .   Today, 09/19/23 CBC: Hgb low but stable.  Noted diagnosis of anemia, pt getting PO iron  supplementation SCr 0.78, weight 143 kg  Pt on enoxaparin  80 mg (0.5 mg/kg) subQ q24h for DVT prophylaxis and BMI >30. Last dose given 1/8 @ 0815  Plan:  Discontinue enoxaparin  Initiate apixaban  5 mg PO BID CBC with AM labs tomorrow Monitor for signs of bleeding  Ronal CHRISTELLA Rav, PharmD 09/19/2023,11:58 AM

## 2023-09-19 NOTE — Telephone Encounter (Signed)
 Pharmacy Patient Advocate Encounter  Received notification from [employer] that Prior Authorization for Eliquis  5MG  tablets has been APPROVED from 09/19/2023 to 09/18/2024. Ran test claim, Copay is $15.00. This test claim was processed through [facility]- copay amounts may vary at other pharmacies due to pharmacy/plan contracts, or as the patient moves through the different stages of their insurance plan.   PA #/Case ID/Reference #: [id number] Key: MARLYN

## 2023-09-19 NOTE — Progress Notes (Signed)
   09/19/23 2237  BiPAP/CPAP/SIPAP  BiPAP/CPAP/SIPAP Pt Type Adult  BiPAP/CPAP/SIPAP DREAMSTATIOND  Mask Type Full face mask  Mask Size Medium  Flow Rate 5 lpm  Patient Home Equipment No  Auto Titrate Yes (min 5 max 20)

## 2023-09-19 NOTE — Progress Notes (Signed)
 Physical Therapy Treatment Patient Details Name: [name] MRN: [id number] DOB: June 07, 1961 Today's Date: 09/19/2023   History of Present Illness [name] is a 63 y.o.presented 09/10/23 from PCP for worsening Right breast cellulitis, LLE cellulitis. PMH: morbid obesity, right AKA, irritable bowel syndrome, hypertension, hyperlipidemia, hypothyroidism, type 2 diabetes mellitus, rheumatoid arthritis, asthma    PT Comments  Patient  leaning back and to the right. Placed belt on foot of bed.Patient able to pull self forward and shift her   trunk and sit upright. Will  continue PT, sitting on bed edge, attempt standing next visit.    If plan is discharge home, recommend the following: Two people to help with walking and/or transfers;Two people to help with bathing/dressing/bathroom;Assist for transportation;Help with stairs or ramp for entrance;A lot of help with bathing/dressing/bathroom   Can travel by private vehicle        Equipment Recommendations  Hospital bed    Recommendations for Other Services       Precautions / Restrictions Precautions Precautions: None Precaution Comments: R AKA, right shoulder pain ? RTC injury/     Mobility  Bed Mobility               General bed mobility comments: placed belt at foot of bed for patient to use  pull self forward to reposition self. patient pulled forward several times. placed pillows to prop more upright. Patient very pleased    Transfers                        Ambulation/Gait                   Stairs             Wheelchair Mobility     Tilt Bed    Modified Rankin (Stroke Patients Only)       Balance                                            Cognition Arousal: Alert                                              Exercises      General Comments        Pertinent Vitals/Pain Pain Assessment Pain Score: 8  Pain Location: right  shoulder, Pain Descriptors / Indicators: Grimacing, Guarding Pain Intervention(s): Patient requesting pain meds-RN notified    Home Living                          Prior Function            PT Goals (current goals can now be found in the care plan section) Progress towards PT goals: Progressing toward goals    Frequency           PT Plan      Co-evaluation              AM-PAC PT 6 Clicks Mobility   Outcome Measure  Help needed turning from your back to your side while in a flat bed without using bedrails?: A Lot Help needed moving from lying on your back to sitting on the side of  a flat bed without using bedrails?: Total Help needed moving to and from a bed to a chair (including a wheelchair)?: Total Help needed standing up from a chair using your arms (e.g., wheelchair or bedside chair)?: Total Help needed to walk in hospital room?: Total Help needed climbing 3-5 steps with a railing? : Total 6 Click Score: 7    End of Session   Activity Tolerance: Patient tolerated treatment well Patient left: in bed;with call bell/phone within reach;with bed alarm set Nurse Communication: Mobility status;Need for lift equipment PT Visit Diagnosis: Unsteadiness on feet (R26.81);Muscle weakness (generalized) (M62.81);Difficulty in walking, not elsewhere classified (R26.2);Pain Pain - Right/Left: Right Pain - part of body: Shoulder     Time: 8463-8444 PT Time Calculation (min) (ACUTE ONLY): 19 min  Charges:    $Self Care/Home Management: 8-22 PT General Charges $$ ACUTE PT VISIT: 1 Visit                     Darice Potters PT Acute Rehabilitation Services Office [phone] Weekend [phone]    Potters Darice Norris 09/19/2023, 4:17 PM

## 2023-09-19 NOTE — Progress Notes (Signed)
 Patient Name: [name] Date of Encounter: 09/19/2023 [facility] Cardiologist: Wilbert Bihari, MD   Interval Summary  .    63 yr old female with PMH of CAD s/p RCA stents 2017 and 2019, paroxysmal A fib, HTN, HLD, HFpEF, morbid obesity, asthma, fibromyalgia, type 2 DM, chronic venous stasis, RA, right AKA, IBS, who is admitted for sepsis 2/2 LLE and right breast cellulitis, course complicated by acute hypoxia that was felt due to CHF, OSA, OHS, and asthma. Cardiology is following for CHF.    Patient states she feels slightly improved. She remains on [place] oxygen . She states she has been feeling her heart racing for some time. She noted a cough with yellow phlegm for 1-2 weeks, previously coughs when she has problems with asthma. She quit smoking 20 years ago, but did smokes for 20 years in the past. She was not aware of A fib diagnosis and had no symptoms from it in the past.     Vital Signs .    Vitals:   09/18/23 2008 09/19/23 0350 09/19/23 0500 09/19/23 0756  BP: 113/63 126/72    Pulse: (!) 108 94    Resp: 18 17    Temp: 98.6 F (37 C) 98.8 F (37.1 C)    TempSrc: Oral Oral    SpO2: 96% 100%  94%  Weight:   (!) 143.4 kg   Height:        Intake/Output Summary (Last 24 hours) at 09/19/2023 0902 Last data filed at 09/18/2023 2338 Gross per 24 hour  Intake 360 ml  Output 2650 ml  Net -2290 ml      09/19/2023    5:00 AM 09/18/2023    5:00 AM 09/17/2023    5:00 AM  Last 3 Weights  Weight (lbs) 316 lb 2.2 oz 319 lb 10.7 oz 319 lb 10.7 oz  Weight (kg) 143.4 kg 145 kg 145 kg      Telemetry/ECG    Telemetry dating back to 09/16/22 reviewed, P waves intermittently seen, appears to be in sinus rhythm/tachycardia with PAC and PVCs, there seems to be intermittent A fib given lacking P waves and irregularity, artifacts also limiting review, will further review with Dr Raford - Personally Reviewed  Physical Exam .   GEN: No acute distress. Morbidly obese   Neck: Unable assess  JVD due to short and thick neck  Cardiac: Regular, tachycardiac, difficult auscultate heart sound due to body habitus Respiratory: Clear to auscultation bilaterally. GI: Soft, nontender, non-distended  MS: right AKA, RLE in ace wrap, unable assess   Assessment & Plan .     Acute on chronic HFpEF - presented with worsening LLE and right breast cellulitis despite oral antibiotic outpatient, found to be hypoxic  - BNP 106 09/12/23 - CTA chest 09/14/23 showed no PE, small right-sided pleural effusion with partial right lower lobe consolidation, cardiomegaly,  asymmetrical edema and stranding incompletely visualized within the right breast/chest wall and right abdominal wall - CXR 09/17/23 showed  vascular congestion, slightly improved. Increased airspace disease at the right base, atelectasis versus pneumonia - Echo from 09/12/23 showed LVEF 60-65%, not optimal view for RWMA, indeterminate diastolic, RV not well visualized, grossly appears normal in size and function. No significant valvular disease.  - hypoxia etiology likely multifactorial from CHF, undiagnosed OHS/OSA, asthma, atelectasis/pneumonia    - Lasix  increased to 80mg  BID since 09/18/23, Net - 11.3L, weight is down from 347>316 ib, renal index stable, mild hyponatremia 133 today, would continue diuresis  at this dose, track daily weight, I&O< and BMP  - GDMT: SGLT2I related to yeast infection and ARB related to hyperkalemia in the past, continue spironolactone  50mg  daily, unable tolerate beta blocker due to underlying asthma, may trial low dose Entresto   CAD with RCA stents  - no angina symptoms  - continue ASA 81mg , intolerant to statin, PCSK9 inhibitor referral outpatient   Paroxysmal A fib - in the setting of sepsis in the past with no recurrence, not on PTA anticoagulation, intolerate to BB and therefore on Cardizem  for rate control  - Telemetry dating back to 09/16/22 reviewed, P waves intermittently seen, appears to be in sinus  rhythm/tachycardia with PAC and PVCs, there seems to be intermittent A fib given lacking P waves and irregularity, artifacts also limiting review, defer further review to MD - EKG yesterday appears to be in rate controlled A fib, will repeat EKG today  - would start anticoagulation Elqiuis 5mg  BID due to elevated CHA2DS2VASc score, patient is agreeable on this   Acute hypoxic respiratory failure  Cellulitis  Asthma  Morbid obesity  HTN HLD Type 2 DM  Anemia  Hypothyroidism  - per primary team      For questions or updates, please contact [facility] Please consult [email or web address] for contact info under        Signed, Azyriah Nevins, NP

## 2023-09-19 NOTE — Hospital Course (Addendum)
 Brief Narrative:  63 y.o. female with medical history significant of morbid obesity, right AKA, irritable bowel syndrome, HTN, HLD, DM, RA, asthma who presented to ED at her PCPs recommendation with worsening cellulitis of right breast.   Of note, patient initially presented to ED on 09/03/2023 and was diagnosed with left lower extremity as well as cellulitis under right breast and was discharged home on cefadroxil .     Due to patient's worsening symptoms of the right breast and left lower extremity patient presented to [facility] emergency department on 12/30.   Upon this admission found to have sepsis secondary to right breast cellulitis completed 7 days of IV Rocephin  on 1/6.   Hospital course was complicated by progressive hypoxia secondary to diastolic CHF, getting IV diuretics per cardiology.  Furthermore A-fib is being managed with help of cardiology team as well.  Patient also underwent RHC which showed overall normal filling pressure but concerns of PAH.  It was difficult to wean her off oxygen  despite of aggressive diuresis and medical management.  Eventually it was determined that patient would be beneficial for patient to have NIV therefore arrangements are being made by TOC.  Foley catheter was placed due to complicated wound and the location of it.  Patient will need to follow-up outpatient urology if she is going to keep the Foley catheter in for periodic exchange.  She understands she is at a high risk for recurrent infection.   Assessment & Plan:  Principal Problem:   Cellulitis of left lower extremity Active Problems:   Acute on chronic diastolic CHF (congestive heart failure) (HCC)   Type 2 diabetes mellitus with hyperglycemia, with long-term current use of insulin  (HCC)   CAD S/P percutaneous coronary angioplasty   Iron  deficiency anemia   Mixed connective tissue disease (HCC)   Gastroesophageal reflux disease   Morbid obesity (HCC)   Sepsis due to cellulitis  (HCC)   Paroxysmal atrial fibrillation (HCC)   Acute on chronic heart failure with preserved ejection fraction (HFpEF) (HCC)   Acute on chronic respiratory failure with hypoxia and hypercapnia (HCC)   Cellulitis of right breast   Open wound of plantar aspect of left foot   Open wound of genital labia   Open abdominal wall wound, initial encounter   Breast wound, left, initial encounter   Hypokalemia    Acute on chronic respiratory failure with hypoxia and hypercapnia (HCC)  Pulmonary arterial hypertension on home O2, currently on 4L [address] [place] [address].  On room air easily desaturates down to less than 85% -Combination of diastolic CHF, PAH, OHS.  Initially underwent IV diuretics.  RHC performed on 1/15 showed mild group 2 PAH but overall now euvolemic.  ABG 5 AM 1/17: Normal pH with hypercarbia  With the help of TOC and [employer] [facility] will be arranging for home ventilator/trilogy.  Acute on chronic diastolic CHF (congestive heart failure) (HCC) Seen by cardiology team.  Currently patient's Cardizem  has been changed, Aldactone  and torsemide  added.  Will discontinue Lasix .  Should follow-up outpatient.   Paroxysmal atrial fibrillation (HCC) On Cardizem  and Eliquis .   Hypokalemia As needed repletion   Sepsis secondary to cellulitis of left lower extremity Cellulitis of right breast Completed course of antibiotic   Insulin -dependent diabetes mellitus type 2 with hyperglycemia; uncontrolled Peripheral neuropathy Patient's hemoglobin A1c is 8.0.  Seen by diabetic coordinator.  Patient will be returning home on her home insulin  prior to admission.  Would benefit from outpatient follow-up.   CAD S/P percutaneous coronary angioplasty Patient  being evaluated for PCSK9 inhibitor as outpatient Discontinued aspirin  as patient is now on Eliquis  for paroxysmal intrafibrillation.  Elevated TSH - TSH 4.9, free T4 normal Synthroid  200 mcg daily  Gastroesophageal reflux disease Continue Protonix    Iron   deficiency anemia Hemoglobin stable.  Hemoglobin baseline around 10.0    Open wound of plantar aspect of left foot Continue daily dressing changes   Open wound of genital labia Continue daily dressing changes   Open abdominal wall wound, initial encounter Continue daily dressing changes   Breast wound, left, initial encounter Continue daily dressing changes   Foley catheter in place due to complicated chronic wounds Significant issues with Her noncompliance.   DVT prophylaxis: apixaban  (ELIQUIS ) tablet 5 mg     Code Status: Full Code Family Communication: Husband at bedside Status is: Inpatient Remains inpatient appropriate because: Pending home equipments/NIV.   Subjective: Seen and examined at bedside, no complaints  Examination:  General exam: Appears calm and comfortable, morbidly obese Respiratory system: Clear to auscultation. Respiratory effort normal. Cardiovascular system: S1 & S2 heard, RRR. No JVD, murmurs, rubs, gallops or clicks. No pedal edema. Gastrointestinal system: Abdomen is nondistended, soft and nontender. No organomegaly or masses felt. Normal bowel sounds heard. Central nervous system: Alert and oriented. No focal neurological deficits. Extremities: Symmetric 5 x 5 power. Skin: Right lower extremity wound which are dressed.  Right sided AKA Psychiatry: Judgement and insight appear normal. Mood & affect appropriate. Foley catheter in place

## 2023-09-20 DIAGNOSIS — E1165 Type 2 diabetes mellitus with hyperglycemia: Secondary | ICD-10-CM | POA: Diagnosis not present

## 2023-09-20 DIAGNOSIS — L03116 Cellulitis of left lower limb: Secondary | ICD-10-CM | POA: Diagnosis not present

## 2023-09-20 DIAGNOSIS — S21002A Unspecified open wound of left breast, initial encounter: Secondary | ICD-10-CM

## 2023-09-20 DIAGNOSIS — S91302A Unspecified open wound, left foot, initial encounter: Secondary | ICD-10-CM

## 2023-09-20 DIAGNOSIS — I5033 Acute on chronic diastolic (congestive) heart failure: Secondary | ICD-10-CM | POA: Diagnosis not present

## 2023-09-20 DIAGNOSIS — I2510 Atherosclerotic heart disease of native coronary artery without angina pectoris: Secondary | ICD-10-CM | POA: Diagnosis not present

## 2023-09-20 DIAGNOSIS — S31109A Unspecified open wound of abdominal wall, unspecified quadrant without penetration into peritoneal cavity, initial encounter: Secondary | ICD-10-CM

## 2023-09-20 DIAGNOSIS — S3140XA Unspecified open wound of vagina and vulva, initial encounter: Secondary | ICD-10-CM

## 2023-09-20 LAB — GLUCOSE, CAPILLARY
Glucose-Capillary: 204 mg/dL — ABNORMAL HIGH (ref 70–99)
Glucose-Capillary: 183 mg/dL — ABNORMAL HIGH (ref 70–99)
Glucose-Capillary: 178 mg/dL — ABNORMAL HIGH (ref 70–99)
Glucose-Capillary: 167 mg/dL — ABNORMAL HIGH (ref 70–99)

## 2023-09-20 LAB — COMPREHENSIVE METABOLIC PANEL
ALT: 18 U/L (ref 0–44)
AST: 38 U/L (ref 15–41)
Albumin: 3.1 g/dL — ABNORMAL LOW (ref 3.5–5.0)
Alkaline Phosphatase: 74 U/L (ref 38–126)
Anion gap: 14 (ref 5–15)
BUN: 13 mg/dL (ref 8–23)
CO2: 38 mmol/L — ABNORMAL HIGH (ref 22–32)
Calcium: 8.4 mg/dL — ABNORMAL LOW (ref 8.9–10.3)
Chloride: 81 mmol/L — ABNORMAL LOW (ref 98–111)
Creatinine, Ser: 0.91 mg/dL (ref 0.44–1.00)
GFR, Estimated: >60 mL/min (ref >60)
Glucose, Bld: 212 mg/dL — ABNORMAL HIGH (ref 70–99)
Potassium: 3.6 mmol/L (ref 3.5–5.1)
Sodium: 133 mmol/L — ABNORMAL LOW (ref 135–145)
Total Bilirubin: 0.7 mg/dL (ref 0.0–1.2)
Total Protein: 7.1 g/dL (ref 6.5–8.1)

## 2023-09-20 LAB — CBC WITH DIFFERENTIAL/PLATELET
Abs Immature Granulocytes: 0.04 10*3/uL (ref 0.00–0.07)
Basophils Absolute: 0.1 10*3/uL (ref 0.0–0.1)
Basophils Relative: 1 %
Eosinophils Absolute: 0.1 10*3/uL (ref 0.0–0.5)
Eosinophils Relative: 2 %
HCT: 36.4 % (ref 36.0–46.0)
Hemoglobin: 9 g/dL — ABNORMAL LOW (ref 12.0–15.0)
Immature Granulocytes: 1 %
Lymphocytes Relative: 19 %
Lymphs Abs: 1.3 10*3/uL (ref 0.7–4.0)
MCH: 18.5 pg — ABNORMAL LOW (ref 26.0–34.0)
MCHC: 24.7 g/dL — ABNORMAL LOW (ref 30.0–36.0)
MCV: 74.9 fL — ABNORMAL LOW (ref 80.0–100.0)
Monocytes Absolute: 0.6 10*3/uL (ref 0.1–1.0)
Monocytes Relative: 9 %
Neutro Abs: 4.6 10*3/uL (ref 1.7–7.7)
Neutrophils Relative %: 68 %
Platelets: 262 10*3/uL (ref 150–400)
RBC: 4.86 MIL/uL (ref 3.87–5.11)
RDW: 29.7 % — ABNORMAL HIGH (ref 11.5–15.5)
WBC: 6.8 10*3/uL (ref 4.0–10.5)
nRBC: 0 % (ref 0.0–0.2)

## 2023-09-20 LAB — MAGNESIUM: Magnesium: 2.1 mg/dL (ref 1.7–2.4)

## 2023-09-20 MED ORDER — INSULIN GLARGINE-YFGN 100 UNIT/ML ~~LOC~~ SOLN
30.0000 [IU] | Freq: Every day | SUBCUTANEOUS | Status: DC
  Administered 2023-09-20 – 2023-09-29 (×10): 30 [IU] via SUBCUTANEOUS
  Filled 2023-09-20 (×10): qty 0.3

## 2023-09-20 MED ORDER — INSULIN ASPART 100 UNIT/ML IJ SOLN
0.0000 [IU] | Freq: Three times a day (TID) | INTRAMUSCULAR | Status: DC
  Administered 2023-09-21: 4 [IU] via SUBCUTANEOUS
  Administered 2023-09-21: 3 [IU] via SUBCUTANEOUS
  Administered 2023-09-21 (×2): 4 [IU] via SUBCUTANEOUS
  Administered 2023-09-22: 3 [IU] via SUBCUTANEOUS
  Administered 2023-09-22 (×2): 4 [IU] via SUBCUTANEOUS
  Administered 2023-09-23: 7 [IU] via SUBCUTANEOUS
  Administered 2023-09-23: 4 [IU] via SUBCUTANEOUS
  Administered 2023-09-23 (×2): 3 [IU] via SUBCUTANEOUS
  Administered 2023-09-24: 4 [IU] via SUBCUTANEOUS
  Administered 2023-09-24: 11 [IU] via SUBCUTANEOUS
  Administered 2023-09-24: 3 [IU] via SUBCUTANEOUS
  Administered 2023-09-24: 7 [IU] via SUBCUTANEOUS
  Administered 2023-09-25: 4 [IU] via SUBCUTANEOUS
  Administered 2023-09-25: 11 [IU] via SUBCUTANEOUS
  Administered 2023-09-25: 15 [IU] via SUBCUTANEOUS
  Administered 2023-09-26: 3 [IU] via SUBCUTANEOUS
  Administered 2023-09-26: 4 [IU] via SUBCUTANEOUS
  Administered 2023-09-26: 7 [IU] via SUBCUTANEOUS
  Administered 2023-09-27: 11 [IU] via SUBCUTANEOUS
  Administered 2023-09-27: 4 [IU] via SUBCUTANEOUS
  Administered 2023-09-27 – 2023-09-28 (×3): 7 [IU] via SUBCUTANEOUS
  Administered 2023-09-28: 4 [IU] via SUBCUTANEOUS
  Administered 2023-09-28 – 2023-09-29 (×3): 7 [IU] via SUBCUTANEOUS
  Administered 2023-09-29: 11 [IU] via SUBCUTANEOUS
  Administered 2023-09-29 (×2): 15 [IU] via SUBCUTANEOUS
  Administered 2023-09-30: 7 [IU] via SUBCUTANEOUS
  Administered 2023-09-30 (×2): 15 [IU] via SUBCUTANEOUS
  Administered 2023-09-30: 7 [IU] via SUBCUTANEOUS
  Administered 2023-10-01: 4 [IU] via SUBCUTANEOUS
  Administered 2023-10-01: 15 [IU] via SUBCUTANEOUS

## 2023-09-20 MED ORDER — INSULIN ASPART 100 UNIT/ML IJ SOLN
10.0000 [IU] | Freq: Three times a day (TID) | INTRAMUSCULAR | Status: DC
  Administered 2023-09-21 – 2023-09-29 (×25): 10 [IU] via SUBCUTANEOUS

## 2023-09-20 MED ORDER — OXYCODONE HCL 5 MG PO TABS
20.0000 mg | ORAL_TABLET | Freq: Three times a day (TID) | ORAL | Status: DC | PRN
  Administered 2023-09-20 – 2023-09-30 (×22): 20 mg via ORAL
  Filled 2023-09-20 (×22): qty 4

## 2023-09-20 NOTE — Assessment & Plan Note (Signed)
Continue daily dressing changes

## 2023-09-20 NOTE — Assessment & Plan Note (Signed)
 Sepsis resolved

## 2023-09-20 NOTE — Assessment & Plan Note (Signed)
 Diltiazem daily for rate control Eliquis for anticoagulation Cardiology following their input is appreciated.

## 2023-09-20 NOTE — Assessment & Plan Note (Signed)
 Likely caused by broken down skin in the setting of severe peripheral edema.   Antibiotic course complete

## 2023-09-20 NOTE — Discharge Instructions (Signed)

## 2023-09-20 NOTE — Assessment & Plan Note (Signed)
 Patient been placed on Accu-Cheks before every meal and nightly with sliding scale insulin Continue to provide patient with U-500 5 units 3 times daily Hemoglobin A1C 8.0% Diabetic Diet

## 2023-09-20 NOTE — Plan of Care (Signed)
  Problem: Fluid Volume: Goal: Hemodynamic stability will improve Outcome: Progressing   Problem: Clinical Measurements: Goal: Diagnostic test results will improve Outcome: Progressing Goal: Signs and symptoms of infection will decrease Outcome: Progressing   Problem: Respiratory: Goal: Ability to maintain adequate ventilation will improve Outcome: Progressing   Problem: Health Behavior/Discharge Planning: Goal: Ability to manage health-related needs will improve Outcome: Progressing   Problem: Clinical Measurements: Goal: Ability to maintain clinical measurements within normal limits will improve Outcome: Progressing Goal: Will remain free from infection Outcome: Progressing Goal: Diagnostic test results will improve Outcome: Progressing Goal: Respiratory complications will improve Outcome: Progressing Goal: Cardiovascular complication will be avoided Outcome: Progressing   Problem: Activity: Goal: Risk for activity intolerance will decrease Outcome: Progressing   Problem: Elimination: Goal: Will not experience complications related to bowel motility Outcome: Progressing Goal: Will not experience complications related to urinary retention Outcome: Progressing   Problem: Pain Management: Goal: General experience of comfort will improve Outcome: Progressing   Problem: Safety: Goal: Ability to remain free from injury will improve Outcome: Progressing   Problem: Skin Integrity: Goal: Risk for impaired skin integrity will decrease Outcome: Progressing   Problem: Coping: Goal: Ability to adjust to condition or change in health will improve Outcome: Progressing   Problem: Fluid Volume: Goal: Ability to maintain a balanced intake and output will improve Outcome: Progressing   Problem: Health Behavior/Discharge Planning: Goal: Ability to manage health-related needs will improve Outcome: Progressing   Problem: Metabolic: Goal: Ability to maintain appropriate  glucose levels will improve Outcome: Progressing   Problem: Nutritional: Goal: Progress toward achieving an optimal weight will improve Outcome: Progressing   Problem: Skin Integrity: Goal: Risk for impaired skin integrity will decrease Outcome: Progressing   Problem: Tissue Perfusion: Goal: Adequacy of tissue perfusion will improve Outcome: Progressing   Problem: Nutrition: Goal: Adequate nutrition will be maintained Outcome: Adequate for Discharge   Problem: Coping: Goal: Level of anxiety will decrease Outcome: Adequate for Discharge   Problem: Nutritional: Goal: Maintenance of adequate nutrition will improve Outcome: Adequate for Discharge

## 2023-09-20 NOTE — Assessment & Plan Note (Signed)
Antibiotic course complete

## 2023-09-20 NOTE — Assessment & Plan Note (Signed)
 Cardiology following, their assistance is appreciated Continuing Lasix 80 mg IV twice daily Monitoring renal function and electrolytes with serial chemistries

## 2023-09-20 NOTE — Assessment & Plan Note (Signed)
 No clinical evidence of ischemia Patient being evaluated for PCSK9 inhibitor as outpatient Discontinuing aspirin as patient is now on Eliquis for paroxysmal intrafibrillation.

## 2023-09-20 NOTE — Progress Notes (Signed)
 PROGRESS NOTE   [name]  FMW:[id number] DOB: [birth year] DOA: 09/10/2023 PCP: [name], DO   Date of Service: the patient was seen and examined on 09/20/2023  Brief Narrative:  63 y.o. female with medical history significant of morbid obesity, right AKA, irritable bowel syndrome, HTN, HLD, DM, RA, asthma who presented to ED at her PCPs recommendation with worsening cellulitis of right breast.  Of note, patient initially presented to ED on 09/03/2023 and was diagnosed with left lower extremity as well as cellulitis under right breast and was discharged home on cefadroxil .    Due to patient's worsening symptoms of the right breast and left lower extremity patient presented to [facility] emergency department on 12/30.  Upon evaluation in the emergency department patient was found to exhibit multiple SIRS criteria with concerns for developing sepsis.  Patient was initiated on intravenous antibiotics and the hospitalist group was then called to assess the patient for admission to the hospital.  SIRS criteria eventually improved with intravenous antibiotic therapy and patient was switched from intravenous vancomycin  to Rocephin  with completion of 7 days of antibiotics while hospitalized.  Patient was noted to have multiple chronic wounds for which the wound care service assisted in wound care recommendations.  Hospital course was complicated by progressive hypoxia thought to be secondary to increasing volume overload of the course of the hospitalization.  Cardiology consult was obtained and it was felt that patient was likely suffering from acute diastolic congestive heart failure.  He was initiated on intravenous diuretics with some symptomatic improvement.   Furthermore, patient was also found to have intermittent bouts of paroxysmal atrial fibrillation throughout the hospitalization as well with no known history of atrial fibrillation in the past.  Patient was  initiated on Eliquis  5 mg twice daily.  Rate control was managed with Cardizem .     Assessment & Plan Acute on chronic respiratory failure with hypoxia and hypercapnia (HCC) Thought to be secondary to acute on chronic diastolic congestive heart failure superimposed on likely obesity hypoventilation syndrome and OSA Continuing to treat with intravenous diuretics Encouraging continued use of BiPAP nightly Acute on chronic diastolic CHF (congestive heart failure) [facility]) Cardiology following, their assistance is appreciated Continuing Lasix  80 mg IV twice daily Monitoring renal function and electrolytes with serial chemistries Paroxysmal atrial fibrillation (HCC) Diltiazem  daily for rate control Eliquis  for anticoagulation Cardiology following their input is appreciated. Cellulitis of left lower extremity Likely caused by broken down skin in the setting of severe peripheral edema.   Antibiotic course complete Type 2 diabetes mellitus with hyperglycemia, with long-term current use of insulin  (HCC) Patient been placed on Accu-Cheks before every meal and nightly with sliding scale insulin  Transitioning patient from U-500 insulin  to 30 units of Semglee  nightly and 10 units of NovoLog  before every meal. Hemoglobin A1C 8.0% Diabetic Diet  CAD S/P percutaneous coronary angioplasty No clinical evidence of ischemia Patient being evaluated for PCSK9 inhibitor as outpatient Discontinuing aspirin  as patient is now on Eliquis  for paroxysmal intrafibrillation. Cellulitis of right breast Antibiotic course complete Sepsis due to cellulitis (HCC) Sepsis resolved Gastroesophageal reflux disease Continue Protonix  Iron  deficiency anemia Hemoglobin stable Obtaining iron  panel Open wound of plantar aspect of left foot Continue daily dressing changes Open wound of genital labia Continue daily dressing changes Open abdominal wall wound, initial encounter Continue daily dressing changes Breast wound,  left, initial encounter Continue daily dressing changes     Subjective:  Patient reports she is feeling better today.  She  states that she is urinating frequently.  She states that her shortness of breath is much improved.  Patient denies cough.  Patient states that her abdominal and left lower extremity swelling are improved.  Physical Exam:  Vitals:   09/20/23 0801 09/20/23 1339 09/20/23 1553 09/20/23 2006  BP:  121/64 130/66 (!) 105/52  Pulse:  87 83 91  Resp:  20 18 16   Temp:  98.1 F (36.7 C) 98.1 F (36.7 C) 98.6 F (37 C)  TempSrc:  Oral Oral Oral  SpO2: 95% 93% 92% 95%  Weight:      Height:         Constitutional: Awake alert and oriented x3, no associated distress.  Patient is obese. Skin: Dressings of the labia, abdominal wall, breast and left lower extremity are clean dry and intact with section of the dressing of the left lower extremity which is soiled with serosanguineous drainage. Eyes: Pupils are equally reactive to light.  No evidence of scleral icterus or conjunctival pallor.  ENMT: Moist mucous membranes noted.  Posterior pharynx clear of any exudate or lesions.   Respiratory: clear to auscultation bilaterally, no wheezing, no crackles. Normal respiratory effort. No accessory muscle use.  Cardiovascular: Regular rate and rhythm, no murmurs / rubs / gallops. No extremity edema. 2+ pedal pulses. No carotid bruits.  Abdomen: Protuberant abdomen with pitting edema of the anterior abdominal wall.  Abdomen is firm to palpation.  No evidence of intra-abdominal masses.  Positive bowel sounds noted in all quadrants.   Musculoskeletal: Status post right AKA.  Improving pitting edema of the left lower extremity.  Good ROM, no contractures. Normal muscle tone.    Data Reviewed:  I have personally reviewed and interpreted labs, imaging.  Significant findings are   CBC: Recent Labs  Lab 09/14/23 0414 09/15/23 0445 09/18/23 0354 09/19/23 0357 09/20/23 0339  WBC  8.5 8.2 7.2 6.7 6.8  NEUTROABS 5.8 5.0 4.6 4.6 4.6  HGB 8.0* 8.1* 9.0* 9.3* 9.0*  HCT 30.8* 32.3* 34.9* 37.9 36.4  MCV 68.6* 71.1* 73.6* 76.0* 74.9*  PLT 289 253 246 280 262   Basic Metabolic Panel: Recent Labs  Lab 09/14/23 0843 09/15/23 0445 09/16/23 0437 09/17/23 0431 09/18/23 0354 09/19/23 0357 09/20/23 0339  NA  --    < > 137 138 136 133* 133*  K  --    < > 4.0 3.5 3.2* 3.8 3.6  CL  --    < > 90* 91* 88* 85* 81*  CO2  --    < > 36* 36* 36* 36* 38*  GLUCOSE  --    < > 199* 188* 184* 172* 212*  BUN  --    < > 13 14 11 10 13   CREATININE  --    < > 0.71 0.80 0.75 0.78 0.91  CALCIUM   --    < > 8.3* 8.2* 8.5* 8.4* 8.4*  MG 1.8  --   --   --   --   --  2.1   < > = values in this interval not displayed.     Code Status:  Full code.  Code status decision has been confirmed with: patient Family Communication: Plan of care discussed with husband at the bedside.   Severity of Illness:  The appropriate patient status for this patient is INPATIENT. Inpatient status is judged to be reasonable and necessary in order to provide the required intensity of service to ensure the patient's safety. The patient's presenting symptoms, physical exam findings,  and initial radiographic and laboratory data in the context of their chronic comorbidities is felt to place them at high risk for further clinical deterioration. Furthermore, it is not anticipated that the patient will be medically stable for discharge from the hospital within 2 midnights of admission.   * I certify that at the point of admission it is my clinical judgment that the patient will require inpatient hospital care spanning beyond 2 midnights from the point of admission due to high intensity of service, high risk for further deterioration and high frequency of surveillance required.*  Time spent:  53 minutes  Author:  Zachary JINNY Ba MD  09/20/2023 9:54 PM

## 2023-09-20 NOTE — Assessment & Plan Note (Signed)
-

## 2023-09-20 NOTE — Progress Notes (Signed)
 Patient Name: [name] Date of Encounter: 09/20/2023 [facility] Cardiologist: Wilbert Bihari, MD   Interval Summary  .    Diuresing well.  [place].  Vital Signs .    Vitals:   09/20/23 0457 09/20/23 0459 09/20/23 0801 09/20/23 1339  BP: (!) 119/55   121/64  Pulse: 100   87  Resp: 18   20  Temp: 98.7 F (37.1 C)   98.1 F (36.7 C)  TempSrc: Oral   Oral  SpO2: 93%  95% 93%  Weight:  (!) 144 kg    Height:        Intake/Output Summary (Last 24 hours) at 09/20/2023 1457 Last data filed at 09/20/2023 1422 Gross per 24 hour  Intake 360 ml  Output 6600 ml  Net -6240 ml      09/20/2023    4:59 AM 09/19/2023    5:00 AM 09/18/2023    5:00 AM  Last 3 Weights  Weight (lbs) 317 lb 7.4 oz 316 lb 2.2 oz 319 lb 10.7 oz  Weight (kg) 144 kg 143.4 kg 145 kg      Telemetry/ECG    Sinus rhythm.  No events.  - Personally Reviewed  Physical Exam .    VS:  BP 121/64 (BP Location: Left Arm)   Pulse 87   Temp 98.1 F (36.7 C) (Oral)   Resp 20   Ht 5' 2.5 (1.588 m)   Wt (!) 144 kg   SpO2 93%   BMI 57.14 kg/m  , BMI Body mass index is 57.14 kg/m. GENERAL:  Well appearing HEENT: Pupils equal round and reactive, fundi not visualized, oral mucosa unremarkable NECK:  No jugular venous distention, waveform within normal limits, carotid upstroke brisk and symmetric, no bruits, no thyromegaly LUNGS:  Clear to auscultation bilaterally HEART:  RRR.  PMI not displaced or sustained,S1 and S2 within normal limits, no S3, no S4, no clicks, no rubs, no murmurs ABD:  large pannus is tense. positive bowel sounds normal in frequency in pitch, no bruits, no rebound, no guarding, no midline pulsatile mass, no hepatomegaly, no splenomegaly EXT:  R AKA.  +tense LLE edema SKIN:  No rashes no nodules NEURO:  Cranial nerves II through XII grossly intact, motor grossly intact throughout PSYCH:  Cognitively intact, oriented to person place and time   Assessment & Plan .     Ms. Matsunaga is a  55F with hypertension, hyperlipidemia, CAD s/p PCI, DM, morbid obesity, and non-adherence to medication admitted with cellulitis, acute on chronic HFpEF and hypoxia.    # Acute on chronic HFpEF: # Hypoxic respiratory failure:  Ms. Doubleday remains significantly volume overloaded.  Pulmonary edema on CXR and abdominal wall edema as well as LLE edema. Improved diuresis after increasing to 80mg  IV bid.  Yesterday she was net -4.7L after giving a dose of metolazone .She is already net -2.2L today.  If diuresis slows she can get more metolazone .  She remains massively volume overloaded.  Renal function is stable.  Continue to encourage CPAP use.   # CAD:  # Hyperlipidemia:   Prior RCA PCI. No ischemic symptoms at this time.  Continue to discuss PCSK9 inhibitor as outpatient. Stop aspirin  given that we are starting Eliquis .    # PAF:  Rate better controlled on increased dose of diltiazem .  Continue Eliquis .   For questions or updates, please contact [facility] Please consult [email or web address] for contact info under        Signed, Annabella Scarce,  MD

## 2023-09-20 NOTE — Assessment & Plan Note (Signed)
 Thought to be secondary to acute on chronic diastolic congestive heart failure superimposed on likely obesity hypoventilation syndrome and OSA Continuing to treat with intravenous diuretics Encouraging continued use of BiPAP nightly

## 2023-09-20 NOTE — Assessment & Plan Note (Signed)
 Patient been placed on Accu-Cheks before every meal and nightly with sliding scale insulin Transitioning patient from U-500 insulin to 30 units of Semglee nightly and 10 units of NovoLog before every meal. Hemoglobin A1C 8.0% Diabetic Diet

## 2023-09-20 NOTE — Progress Notes (Signed)
 Physical Therapy Treatment Patient Details Name: [name] MRN: [id number] DOB: 1961-05-09 Today's Date: 09/20/2023   History of Present Illness [name] is a 63 y.o.presented 09/10/23 from PCP for worsening Right breast cellulitis, LLE cellulitis. PMH: morbid obesity, right AKA, irritable bowel syndrome, hypertension, hyperlipidemia, hypothyroidism, type 2 diabetes mellitus, rheumatoid arthritis, asthma    PT Comments  Patient in bed , barely responds without  much stimulation. Noted her to take O2 [place] off. Spo2 within ~ 2 minutes 71%. Replaced to 4 L, SPO2 up to 90%, then dropping to  87-88%. Sats Increase when  stimulated.  Patient did have pain medication ~  45 prior to PT. RN reports that patient has been alert most of the day.   Continue PT for mobility efforts, do not expect patient will be able to stand as she reports being able to perform PTA. Transfers will be a challenge  with body habitus and wounds.   If plan is discharge home, recommend the following: Two people to help with walking and/or transfers;Two people to help with bathing/dressing/bathroom;Assist for transportation;Help with stairs or ramp for entrance;A lot of help with bathing/dressing/bathroom   Can travel by private vehicle        Equipment Recommendations    Hospital bed   Recommendations for Other Services       Precautions / Restrictions Precautions Precaution Comments: R AKA, right shoulder pain ? RTC injury/, desats Restrictions Weight Bearing Restrictions Per Provider Order: No     Mobility  Bed Mobility               General bed mobility comments: ,max support to  attempt readjustment in bed, patient in supine, states she sat up in bed this am.    Transfers                        Ambulation/Gait                   Stairs             Wheelchair Mobility     Tilt Bed    Modified Rankin (Stroke Patients Only)       Balance                                             Cognition Arousal: Obtunded, Suspect due to medications (also O2 off)                                     General Comments: patient  limited responses, did say she had pain meds which is contributing to her lethargy        Exercises      General Comments        Pertinent Vitals/Pain Pain Assessment Pain Assessment: Faces Faces Pain Scale: No hurt    Home Living                          Prior Function            PT Goals (current goals can now be found in the care plan section) Progress towards PT goals: Not progressing toward goals - comment    Frequency  PT Plan      Co-evaluation              AM-PAC PT 6 Clicks Mobility   Outcome Measure  Help needed turning from your back to your side while in a flat bed without using bedrails?: A Lot Help needed moving from lying on your back to sitting on the side of a flat bed without using bedrails?: Total Help needed moving to and from a bed to a chair (including a wheelchair)?: Total Help needed standing up from a chair using your arms (e.g., wheelchair or bedside chair)?: Total Help needed to walk in hospital room?: Total Help needed climbing 3-5 steps with a railing? : Total 6 Click Score: 7    End of Session   Activity Tolerance: Patient limited by lethargy Patient left: in bed;with call bell/phone within reach;with bed alarm set Nurse Communication: Mobility status;Need for lift equipment PT Visit Diagnosis: Unsteadiness on feet (R26.81);Muscle weakness (generalized) (M62.81);Difficulty in walking, not elsewhere classified (R26.2);Pain     Time: 8481-8469 PT Time Calculation (min) (ACUTE ONLY): 12 min  Charges:    $Therapeutic Activity: 8-22 mins PT General Charges $$ ACUTE PT VISIT: 1 Visit                     Darice Potters PT Acute Rehabilitation Services Office [phone] Weekend  pager-[phone]   Potters Darice Norris 09/20/2023, 3:36 PM

## 2023-09-20 NOTE — Assessment & Plan Note (Signed)
 Hemoglobin stable Obtaining iron panel

## 2023-09-20 NOTE — Assessment & Plan Note (Signed)
 Thought to be secondary to acute on chronic diastolic congestive heart failure Continuing to treat with intravenous diuretics

## 2023-09-20 NOTE — Plan of Care (Signed)
  Problem: Clinical Measurements: Goal: Diagnostic test results will improve Outcome: Progressing   Problem: Respiratory: Goal: Ability to maintain adequate ventilation will improve Outcome: Progressing   Problem: Safety: Goal: Ability to remain free from injury will improve Outcome: Progressing   Problem: Education: Goal: Ability to describe self-care measures that may prevent or decrease complications (Diabetes Survival Skills Education) will improve Outcome: Progressing

## 2023-09-21 DIAGNOSIS — E1165 Type 2 diabetes mellitus with hyperglycemia: Secondary | ICD-10-CM | POA: Diagnosis not present

## 2023-09-21 DIAGNOSIS — L03116 Cellulitis of left lower limb: Secondary | ICD-10-CM | POA: Diagnosis not present

## 2023-09-21 DIAGNOSIS — I2510 Atherosclerotic heart disease of native coronary artery without angina pectoris: Secondary | ICD-10-CM | POA: Diagnosis not present

## 2023-09-21 DIAGNOSIS — I5033 Acute on chronic diastolic (congestive) heart failure: Secondary | ICD-10-CM | POA: Diagnosis not present

## 2023-09-21 LAB — GLUCOSE, CAPILLARY
Glucose-Capillary: 240 mg/dL — ABNORMAL HIGH (ref 70–99)
Glucose-Capillary: 200 mg/dL — ABNORMAL HIGH (ref 70–99)
Glucose-Capillary: 194 mg/dL — ABNORMAL HIGH (ref 70–99)
Glucose-Capillary: 162 mg/dL — ABNORMAL HIGH (ref 70–99)
Glucose-Capillary: 118 mg/dL — ABNORMAL HIGH (ref 70–99)
Glucose-Capillary: 132 mg/dL — ABNORMAL HIGH (ref 70–99)

## 2023-09-21 LAB — COMPREHENSIVE METABOLIC PANEL
ALT: 23 U/L (ref 0–44)
AST: 55 U/L — ABNORMAL HIGH (ref 15–41)
Albumin: 3.6 g/dL (ref 3.5–5.0)
Alkaline Phosphatase: 79 U/L (ref 38–126)
Anion gap: 15 (ref 5–15)
BUN: 18 mg/dL (ref 8–23)
CO2: 41 mmol/L — ABNORMAL HIGH (ref 22–32)
Calcium: 8.8 mg/dL — ABNORMAL LOW (ref 8.9–10.3)
Chloride: 77 mmol/L — ABNORMAL LOW (ref 98–111)
Creatinine, Ser: 1.25 mg/dL — ABNORMAL HIGH (ref 0.44–1.00)
GFR, Estimated: 49 mL/min — ABNORMAL LOW (ref >60)
Glucose, Bld: 251 mg/dL — ABNORMAL HIGH (ref 70–99)
Potassium: 3.3 mmol/L — ABNORMAL LOW (ref 3.5–5.1)
Sodium: 133 mmol/L — ABNORMAL LOW (ref 135–145)
Total Bilirubin: 1.1 mg/dL (ref 0.0–1.2)
Total Protein: 8 g/dL (ref 6.5–8.1)

## 2023-09-21 LAB — MAGNESIUM: Magnesium: 2.1 mg/dL (ref 1.7–2.4)

## 2023-09-21 LAB — IRON AND TIBC
Iron: 42 ug/dL (ref 28–170)
Saturation Ratios: 7 % — ABNORMAL LOW (ref 10.4–31.8)
TIBC: 575 ug/dL — ABNORMAL HIGH (ref 250–450)
UIBC: 533 ug/dL

## 2023-09-21 LAB — FERRITIN: Ferritin: 498 ng/mL — ABNORMAL HIGH (ref 11–307)

## 2023-09-21 MED ORDER — POTASSIUM CHLORIDE CRYS ER 20 MEQ PO TBCR
40.0000 meq | EXTENDED_RELEASE_TABLET | Freq: Once | ORAL | Status: AC
  Administered 2023-09-22: 40 meq via ORAL
  Filled 2023-09-21: qty 2

## 2023-09-21 NOTE — Assessment & Plan Note (Addendum)
 Likely caused by broken down skin in the setting of severe peripheral edema.   Antibiotic course complete Compression wraps.

## 2023-09-21 NOTE — Progress Notes (Signed)
   09/21/23 2104  BiPAP/CPAP/SIPAP  Reason BIPAP/CPAP not in use Non-compliant (cannot tolerate)  BiPAP/CPAP /SiPAP Vitals  Temp 98.4 F (36.9 C)  Pulse Rate 84  Resp 19  BP 105/63  SpO2 96 %  MEWS Score/Color  MEWS Score 0  MEWS Score Color [place]

## 2023-09-21 NOTE — Progress Notes (Signed)
   09/21/23 0000  BiPAP/CPAP/SIPAP  Reason BIPAP/CPAP not in use Non-compliant (Pt refused, said she can not tollerate.)

## 2023-09-21 NOTE — Assessment & Plan Note (Signed)
 Hemoglobin stable Obtaining iron panel

## 2023-09-21 NOTE — Assessment & Plan Note (Addendum)
 Controlled on diltiazem. Eliquis for anticoagulation Cardiology following their input is appreciated.

## 2023-09-21 NOTE — Assessment & Plan Note (Signed)
Continue daily dressing changes

## 2023-09-21 NOTE — Assessment & Plan Note (Signed)
Antibiotic course complete

## 2023-09-21 NOTE — Assessment & Plan Note (Signed)
-

## 2023-09-21 NOTE — Plan of Care (Signed)
  Problem: Fluid Volume: Goal: Hemodynamic stability will improve Outcome: Progressing   Problem: Clinical Measurements: Goal: Diagnostic test results will improve Outcome: Progressing Goal: Signs and symptoms of infection will decrease Outcome: Progressing   Problem: Respiratory: Goal: Ability to maintain adequate ventilation will improve Outcome: Progressing   Problem: Health Behavior/Discharge Planning: Goal: Ability to manage health-related needs will improve Outcome: Progressing   Problem: Clinical Measurements: Goal: Ability to maintain clinical measurements within normal limits will improve Outcome: Progressing Goal: Will remain free from infection Outcome: Progressing Goal: Diagnostic test results will improve Outcome: Progressing Goal: Respiratory complications will improve Outcome: Progressing   Problem: Activity: Goal: Risk for activity intolerance will decrease Outcome: Progressing   Problem: Coping: Goal: Level of anxiety will decrease Outcome: Progressing   Problem: Elimination: Goal: Will not experience complications related to bowel motility Outcome: Progressing   Problem: Pain Management: Goal: General experience of comfort will improve Outcome: Progressing   Problem: Safety: Goal: Ability to remain free from injury will improve Outcome: Progressing   Problem: Skin Integrity: Goal: Risk for impaired skin integrity will decrease Outcome: Progressing   Problem: Fluid Volume: Goal: Ability to maintain a balanced intake and output will improve Outcome: Progressing   Problem: Nutritional: Goal: Progress toward achieving an optimal weight will improve Outcome: Progressing   Problem: Skin Integrity: Goal: Risk for impaired skin integrity will decrease Outcome: Progressing   Problem: Clinical Measurements: Goal: Cardiovascular complication will be avoided Outcome: Adequate for Discharge   Problem: Nutrition: Goal: Adequate nutrition will  be maintained Outcome: Adequate for Discharge

## 2023-09-21 NOTE — Assessment & Plan Note (Signed)
 Sepsis resolved

## 2023-09-21 NOTE — Progress Notes (Signed)
 Rounding Note    Patient Name: [name] Date of Encounter: 09/21/2023  [facility] Cardiologist: Wilbert Bihari, MD   Subjective   Pt found sitting up sleeping, no complaints.   Inpatient Medications    Scheduled Meds:  apixaban   5 mg Oral BID   arformoterol   15 mcg Nebulization BID   budesonide  (PULMICORT ) nebulizer solution  0.25 mg Nebulization BID   Chlorhexidine  Gluconate Cloth  6 each Topical Daily   diltiazem   300 mg Oral Daily   ferrous sulfate   325 mg Oral BID WC   fluticasone   1 spray Each Nare Daily   furosemide   80 mg Intravenous BID   gabapentin   400 mg Oral BID   gabapentin   800 mg Oral QHS   Gerhardt's butt cream   Topical BID   guaiFENesin   600 mg Oral BID   insulin  aspart  0-20 Units Subcutaneous TID AC & HS   insulin  aspart  10 Units Subcutaneous TID WC   insulin  glargine-yfgn  30 Units Subcutaneous QHS   levothyroxine   200 mcg Oral QAC breakfast   lidocaine   2 patch Transdermal Daily   mupirocin  ointment   Topical BID   nystatin    Topical TID   pantoprazole   40 mg Oral Daily   pramipexole   1 mg Oral TID   pregabalin   50 mg Oral TID   sodium chloride  flush  3 mL Intravenous Q12H   spironolactone   50 mg Oral Daily   Continuous Infusions:  PRN Meds: acetaminophen  **OR** acetaminophen , albuterol , ondansetron  **OR** ondansetron  (ZOFRAN ) IV, oxyCODONE , sodium chloride    Vital Signs    Vitals:   09/21/23 0439 09/21/23 0448 09/21/23 0743 09/21/23 0746  BP: (!) 115/59     Pulse: 99 93    Resp:      Temp: 98.1 F (36.7 C)     TempSrc: Oral     SpO2: (!) 43% (!) 86% 94% 94%  Weight: (!) 136.7 kg     Height:        Intake/Output Summary (Last 24 hours) at 09/21/2023 0941 Last data filed at 09/21/2023 0300 Gross per 24 hour  Intake --  Output 4125 ml  Net -4125 ml      09/21/2023    4:39 AM 09/20/2023    4:59 AM 09/19/2023    5:00 AM  Last 3 Weights  Weight (lbs) 301 lb 6.4 oz 317 lb 7.4 oz 316 lb 2.2 oz  Weight (kg) 136.714  kg 144 kg 143.4 kg      Telemetry    P waves difficult to see, episodes of some irregularity - Personally Reviewed  ECG    No new tracings - Personally Reviewed  Physical Exam   GEN: morbidly obese female.   Neck: large neck size Cardiac: RRR, no murmurs, rubs, or gallops.  Respiratory: crackles in bases GI: Soft, nontender, non-distended  MS: right AKA, left lower leg wrapped with significant redness, swelling, and chronic skin changes Neuro:  Nonfocal  Psych: Normal affect   Labs    High Sensitivity Troponin:   Recent Labs  Lab 09/10/23 1434  TROPONINIHS 17     Chemistry Recent Labs  Lab 09/19/23 0357 09/20/23 0339 09/21/23 0352  NA 133* 133* 133*  K 3.8 3.6 3.3*  CL 85* 81* 77*  CO2 36* 38* 41*  GLUCOSE 172* 212* 251*  BUN 10 13 18   CREATININE 0.78 0.91 1.25*  CALCIUM  8.4* 8.4* 8.8*  MG  --  2.1 2.1  PROT  --  7.1 8.0  ALBUMIN  --  3.1* 3.6  AST  --  38 55*  ALT  --  18 23  ALKPHOS  --  74 79  BILITOT  --  0.7 1.1  GFRNONAA >60 >60 49*  ANIONGAP 12 14 15     Lipids No results for input(s): CHOL, TRIG, HDL, LABVLDL, LDLCALC, CHOLHDL in the last 168 hours.  Hematology Recent Labs  Lab 09/18/23 0354 09/19/23 0357 09/20/23 0339  WBC 7.2 6.7 6.8  RBC 4.74 4.99 4.86  HGB 9.0* 9.3* 9.0*  HCT 34.9* 37.9 36.4  MCV 73.6* 76.0* 74.9*  MCH 19.0* 18.6* 18.5*  MCHC 25.8* 24.5* 24.7*  RDW 28.2* 29.2* 29.7*  PLT 246 280 262   Thyroid  No results for input(s): TSH, FREET4 in the last 168 hours.  BNPNo results for input(s): BNP, PROBNP in the last 168 hours.  DDimer No results for input(s): DDIMER in the last 168 hours.   Radiology    No results found.  Cardiac Studies   Echo 09/11/22: 1. Left ventricular ejection fraction, by estimation, is 60 to 65%. The  left ventricle has normal function. Left ventricular endocardial border  not optimally defined to evaluate regional wall motion. Left ventricular  diastolic parameters are   indeterminate.   2. RV not well visualized, grossly appears normal in size and function. .  Right ventricular systolic function was not well visualized. The right  ventricular size is not well visualized. Tricuspid regurgitation signal is  inadequate for assessing PA  pressure.   3. The mitral valve was not well visualized. No evidence of mitral valve  regurgitation. No evidence of mitral stenosis.   4. The aortic valve was not well visualized. Aortic valve regurgitation  is not visualized. No aortic stenosis is present.   5. Technically difficult study, poor visualization.   Patient Profile     63 y.o. female with hypertension, hyperlipidemia, CAD s/p PCI, DM, morbid obesity, and non-adherence to medication admitted with cellulitis, acute on chronic HFpEF and hypoxia.   Assessment & Plan    Acute on chronic diastolic heart failure Hypoxic respiratory failure - currently diuresing on 80 mg IV lasix  BID along with metolazone  x 1 dose with good diuresis of 4L urine output - she remains significantly volume up with small bump in creatinine - hold metolazone  today and monitor output - continue IV lasix  at current dose   CAD with RCA PCI Hyperlipidemia - pt does not want to take statins - will need to reactivate referral for PCSK9i   Chest pain - suspect MSK etiology due to using a walker and upper body to help her hop on one leg   PAF - appears sinus rhythm on telemetry, but PAF on telemetry - reported palpitations on BB - cardizem  increased to 300 mg daily previously - continue eliquis    DM with hyperglycemia Elevated TSH Hx of medication noncompliance       For questions or updates, please contact  [facility] Please consult [email or web address] for contact info under        Signed, Jon Nat Hails, PA  09/21/2023, 9:41 AM

## 2023-09-21 NOTE — Assessment & Plan Note (Signed)
 No clinical evidence of ischemia Patient being evaluated for PCSK9 inhibitor as outpatient Discontinuing aspirin as patient is now on Eliquis for paroxysmal intrafibrillation.

## 2023-09-21 NOTE — Plan of Care (Signed)
  Problem: Coping: Goal: Level of anxiety will decrease Outcome: Progressing   Problem: Safety: Goal: Ability to remain free from injury will improve Outcome: Progressing   Problem: Skin Integrity: Goal: Risk for impaired skin integrity will decrease Outcome: Progressing   Problem: Education: Goal: Ability to describe self-care measures that may prevent or decrease complications (Diabetes Survival Skills Education) will improve Outcome: Progressing

## 2023-09-21 NOTE — Assessment & Plan Note (Addendum)
 While patient is exhibiting substantial peripheral edema including pitting ventral abdominal wall edema and left lower extremity edema I believe that most of what remains is either dependent edema or lymphedema.  I believe patient is approaching intravascular euvolemia.   Creatinine rising Cardiology following  Continuing Lasix  80 mg IV twice daily Monitoring renal function and electrolytes with serial chemistries

## 2023-09-21 NOTE — Assessment & Plan Note (Addendum)
 Patient been placed on Accu-Cheks before every meal and nightly with sliding scale insulin  Patient was transitioned from U-500 insulin  to 30 units of Semglee  nightly and 10 units of NovoLog  before every meal which has resulted in good glycemic control. Hemoglobin A1C 8.0% Diabetic Diet

## 2023-09-21 NOTE — Assessment & Plan Note (Addendum)
 Thought to be secondary to acute on chronic diastolic congestive heart failure superimposed on likely obesity hypoventilation syndrome and OSA Continuing to treat with diuretics  Encouraging continued use of BiPAP nightly

## 2023-09-21 NOTE — Progress Notes (Signed)
 PROGRESS NOTE   [name]  FMW:[id number] DOB: 1960-09-20 DOA: 09/10/2023 PCP: [name], DO   Date of Service: the patient was seen and examined on 09/21/2023  Brief Narrative:  63 y.o. female with medical history significant of morbid obesity, right AKA, irritable bowel syndrome, HTN, HLD, DM, RA, asthma who presented to ED at her PCPs recommendation with worsening cellulitis of right breast.  Of note, patient initially presented to ED on 09/03/2023 and was diagnosed with left lower extremity as well as cellulitis under right breast and was discharged home on cefadroxil .    Due to patient's worsening symptoms of the right breast and left lower extremity patient presented to [facility] emergency department on 12/30.  Upon evaluation in the emergency department patient was found to exhibit multiple SIRS criteria with concerns for developing sepsis.  Patient was initiated on intravenous antibiotics and the hospitalist group was then called to assess the patient for admission to the hospital.  SIRS criteria eventually improved with intravenous antibiotic therapy and patient was switched from intravenous vancomycin  to Rocephin  with completion of 7 days of antibiotics while hospitalized.  Patient was noted to have multiple chronic wounds for which the wound care service assisted in wound care recommendations.  Hospital course was complicated by progressive hypoxia thought to be secondary to increasing volume overload of the course of the hospitalization.  Cardiology consult was obtained and it was felt that patient was likely suffering from acute diastolic congestive heart failure.  He was initiated on intravenous diuretics with some symptomatic improvement.   Furthermore, patient was also found to have intermittent bouts of paroxysmal atrial fibrillation throughout the hospitalization as well with no known history of atrial fibrillation in the past.  Patient was  initiated on Eliquis  5 mg twice daily.  Rate control was managed with Cardizem .     Assessment & Plan Acute on chronic respiratory failure with hypoxia and hypercapnia (HCC) Thought to be secondary to acute on chronic diastolic congestive heart failure superimposed on likely obesity hypoventilation syndrome and OSA Continuing to treat with diuretics  Encouraging continued use of BiPAP nightly Acute on chronic diastolic CHF (congestive heart failure) (HCC) While patient is exhibiting substantial peripheral edema including pitting ventral abdominal wall edema and left lower extremity edema I believe that most of what remains is either dependent edema or lymphedema.  I believe patient is approaching intravascular euvolemia.   Creatinine rising Cardiology following  Continuing Lasix  80 mg IV twice daily Monitoring renal function and electrolytes with serial chemistries Paroxysmal atrial fibrillation (HCC) Controlled on diltiazem . Eliquis  for anticoagulation Cardiology following their input is appreciated. Cellulitis of left lower extremity Likely caused by broken down skin in the setting of severe peripheral edema.   Antibiotic course complete Compression wraps. Type 2 diabetes mellitus with hyperglycemia, with long-term current use of insulin  (HCC) Patient been placed on Accu-Cheks before every meal and nightly with sliding scale insulin  Patient was transitioned from U-500 insulin  to 30 units of Semglee  nightly and 10 units of NovoLog  before every meal which has resulted in good glycemic control. Hemoglobin A1C 8.0% Diabetic Diet  CAD S/P percutaneous coronary angioplasty No clinical evidence of ischemia Patient being evaluated for PCSK9 inhibitor as outpatient Discontinuing aspirin  as patient is now on Eliquis  for paroxysmal intrafibrillation. Cellulitis of right breast Antibiotic course complete Sepsis due to cellulitis (HCC) Sepsis resolved Gastroesophageal reflux  disease Continue Protonix  Iron  deficiency anemia Hemoglobin stable Obtaining iron  panel Open wound of plantar aspect of left  foot Continue daily dressing changes Open wound of genital labia Continue daily dressing changes Open abdominal wall wound, initial encounter Continue daily dressing changes Breast wound, left, initial encounter Continue daily dressing changes     Subjective:  Patient is complaining of increasing generalized weakness and nausea.  Patient maintains that her shortness of breath is much improved.  Patient denies cough.  Patient states that her abdominal and left lower extremity swelling are improved.  Physical Exam:  Vitals:   09/21/23 0743 09/21/23 0746 09/21/23 1323 09/21/23 2104  BP:   (!) 111/58 105/63  Pulse:   83 84  Resp:   20 19  Temp:   98.2 F (36.8 C) 98.4 F (36.9 C)  TempSrc:   Oral Oral  SpO2: 94% 94% 99% 96%  Weight:      Height:         Constitutional: Lethargic but arousable and oriented x3, no associated distress.  Patient is obese. Skin: Dressings of the labia, abdominal wall, breast and left lower extremity are clean dry and intact with section of the dressing of the left lower extremity which is soiled with serosanguineous drainage. Eyes: Pupils are equally reactive to light.  No evidence of scleral icterus or conjunctival pallor.  ENMT: Moist mucous membranes noted.  Posterior pharynx clear of any exudate or lesions.   Respiratory: clear to auscultation bilaterally, no wheezing, no crackles. Normal respiratory effort. No accessory muscle use.  Cardiovascular: Regular rate and rhythm, no murmurs / rubs / gallops. No extremity edema. 2+ pedal pulses. No carotid bruits.  Abdomen: Protuberant abdomen with pitting edema of the anterior abdominal wall.  Abdomen is firm to palpation.  No evidence of intra-abdominal masses.  Positive bowel sounds noted in all quadrants.   Musculoskeletal: Status post right AKA.  Improved pitting edema of  the left lower extremity.  Good ROM, no contractures. Normal muscle tone.    Data Reviewed:  I have personally reviewed and interpreted labs, imaging.  Significant findings are   CBC: Recent Labs  Lab 09/15/23 0445 09/18/23 0354 09/19/23 0357 09/20/23 0339  WBC 8.2 7.2 6.7 6.8  NEUTROABS 5.0 4.6 4.6 4.6  HGB 8.1* 9.0* 9.3* 9.0*  HCT 32.3* 34.9* 37.9 36.4  MCV 71.1* 73.6* 76.0* 74.9*  PLT 253 246 280 262   Basic Metabolic Panel: Recent Labs  Lab 09/17/23 0431 09/18/23 0354 09/19/23 0357 09/20/23 0339 09/21/23 0352  NA 138 136 133* 133* 133*  K 3.5 3.2* 3.8 3.6 3.3*  CL 91* 88* 85* 81* 77*  CO2 36* 36* 36* 38* 41*  GLUCOSE 188* 184* 172* 212* 251*  BUN 14 11 10 13 18   CREATININE 0.80 0.75 0.78 0.91 1.25*  CALCIUM  8.2* 8.5* 8.4* 8.4* 8.8*  MG  --   --   --  2.1 2.1     Code Status:  Full code.  Code status decision has been confirmed with: patient Family Communication: Plan of care discussed with husband at the bedside.   Severity of Illness:  The appropriate patient status for this patient is INPATIENT. Inpatient status is judged to be reasonable and necessary in order to provide the required intensity of service to ensure the patient's safety. The patient's presenting symptoms, physical exam findings, and initial radiographic and laboratory data in the context of their chronic comorbidities is felt to place them at high risk for further clinical deterioration. Furthermore, it is not anticipated that the patient will be medically stable for discharge from the hospital within 2 midnights  of admission.   * I certify that at the point of admission it is my clinical judgment that the patient will require inpatient hospital care spanning beyond 2 midnights from the point of admission due to high intensity of service, high risk for further deterioration and high frequency of surveillance required.*  Time spent:  37 minutes  Author:  Zachary JINNY Ba MD  09/21/2023  11:29 PM

## 2023-09-21 NOTE — Progress Notes (Signed)
 PHARMACY - ANTICOAGULATION CONSULT NOTE  Pharmacy Consult for apixaban  Indication: Atrial fibrillation  Allergies  Allergen Reactions   Fish Allergy  Anaphylaxis    INCLUDES OMEGA 3 OILS   Fish Oil Anaphylaxis and Swelling    THROAT SWELLS INCLUDES FISH AS A CLASS   Gabapentin  Anxiety and Other (See Comments)    Anxiety on high doses, Angioedema   Ipratropium-Albuterol  Shortness Of Breath     Bronchospasm, Wheezing becomes worse, can take albuterol  with ipratropium   Omega-3 Fatty Acids Swelling    Throat swelling, Has tolerated Glucerna nutritional drinks     Peanut -Containing Drug Products Shortness Of Breath   Victoza  [Liraglutide ] Nausea And Vomiting    Patient reported severe, debilitating nausea   Lovastatin Other (See Comments)    EXTREME JOINT PAIN   Metronidazole  Nausea And Vomiting and Swelling    Headache and shakes, Pt tolerating IV 06/14/22    Penicillins Rash and Other (See Comments)    UNSPECIFIED, TOLERATES KEFLEX , CHILDHOOD RX, PCN CHALLENGE PER ALLERGIST, 02/29/16.  RESULTS NOT UPDATED.    Red Yeast Rice [Cholestin] Other (See Comments)    Muscle pain and severe joint pain.    Rosuvastatin  Other (See Comments)    Extreme joint pain, to this & other statins    Rotigotine  Swelling    (Neupro ) Extreme edema    Atrovent Hfa [Ipratropium Bromide Hfa] Other (See Comments)    wheezing   Iodine  Swelling    Pt specifies allergen was Povidone-iodine  10 % swab. The reaction was localized to region (BLE) where topical iodine  was being applied.    Ipratropium Bromide Other (See Comments)    Wheezing becomes worse   Morphine     Other Other (See Comments)    Surgical Staples causes redness/infection per patient.   Pepto-Bismol [Bismuth Subsalicylate] Nausea And Vomiting    Extreme vomiting   Wound Dressing Adhesive Other (See Comments)    Electrodes causes skin breakdown, Electrodes causes skin breakdown   Enalapril  Cough   Metoprolol  Palpitations   Suprep [Na  Sulfate-K Sulfate-Mg Sulf] Nausea And Vomiting   Tape Other (See Comments)    Electrodes causes skin breakdown    Patient Measurements: Height: 5' 2.5 (158.8 cm) Weight: (!) 136.7 kg (301 lb 6.4 oz) IBW/kg (Calculated) : 51.25  Vital Signs: Temp: 98.1 F (36.7 C) (01/10 0439) Temp Source: Oral (01/10 0439) BP: 115/59 (01/10 0439) Pulse Rate: 93 (01/10 0448)  Labs: Recent Labs    09/19/23 0357 09/20/23 0339 09/21/23 0352  HGB 9.3* 9.0*  --   HCT 37.9 36.4  --   PLT 280 262  --   CREATININE 0.78 0.91 1.25*    Estimated Creatinine Clearance: 63 mL/min (A) (by C-G formula based on SCr of 1.25 mg/dL (H)).   Medical History: Past Medical History:  Diagnosis Date   Aortic stenosis    mild AS by echo 02/2021   Asthma    on daily RX and rescue inhaler (04/18/2018)   Chronic diastolic CHF (congestive heart failure) (HCC) 09/2015   Chronic lower back pain    Chronic neck pain    Chronic pain syndrome    Fentanyl  Patch   Colon polyps    Coronary artery disease    cath with normal LM, 30% LAD, 85% mid RCA and 95% distal RCA s/p PCI of the mid to distal RCA and now on DAPT with ASA and Ticagrelor .     Eczema    Excessive daytime sleepiness 11/26/2015   Fibromyalgia    Gallstones  GERD (gastroesophageal reflux disease)    Heart murmur    noted for the 1st time on 04/18/2018   History of blood transfusion 07/2010   S/P oophorectomy   History of gout    History of hiatal hernia 1980s   gone now (04/18/2018)   Hyperlipidemia    Hypertension    takes Metoprolol  and Enalapril  daily   Hypothyroidism    takes Synthroid  daily   IBS (irritable bowel syndrome)    Migraine    nothing in the 2000s (04/18/2018)   Mixed connective tissue disease (HCC)    NAFLD (nonalcoholic fatty liver disease)    Pneumonia    several times (04/18/2018)   PVC's (premature ventricular contractions)    noted on event monitor 02/2021   Rheumatoid arthritis (HCC)    hands, elbows,  shoulders, probably knees (04/18/2018)   Scoliosis    Sjogren's syndrome (HCC)    Sleep apnea    mild - does not use cpap   Spondylosis    Type II diabetes mellitus (HCC)    takes Metformin  and Hum R daily (04/18/2018)   Walker as ambulation aid    also uses wheelchair    Medications: No anticoagulants PTA  Assessment: Pt is a 23 yoF with extensive PMH including CAD, HF, and iron  deficient anemia. Cardiology is following and initiating apixaban  for atrial fibrillation. Pharmacy consulted to dose apixaban .   Today, 09/21/23 CBC: Hgb low but stable. Plt WNL. Noted diagnosis of anemia, pt getting PO iron  supplementation SCr bumped slightly, receiving diuretics. CrCl ~60 mL/min. Given patient's age and weight, SCr increase will not impact dosing.  Medication counseling was provided on 09/20/23 and all questions answered.   Plan:  Continue apixaban  5 mg PO BID  Pharmacy to sign off.   Ronal CHRISTELLA Rav, PharmD 09/21/2023,8:22 AM

## 2023-09-22 DIAGNOSIS — S91302A Unspecified open wound, left foot, initial encounter: Secondary | ICD-10-CM

## 2023-09-22 DIAGNOSIS — I5033 Acute on chronic diastolic (congestive) heart failure: Secondary | ICD-10-CM | POA: Diagnosis not present

## 2023-09-22 DIAGNOSIS — E876 Hypokalemia: Secondary | ICD-10-CM | POA: Insufficient documentation

## 2023-09-22 DIAGNOSIS — L03116 Cellulitis of left lower limb: Secondary | ICD-10-CM | POA: Diagnosis not present

## 2023-09-22 DIAGNOSIS — E1165 Type 2 diabetes mellitus with hyperglycemia: Secondary | ICD-10-CM | POA: Diagnosis not present

## 2023-09-22 DIAGNOSIS — S21002A Unspecified open wound of left breast, initial encounter: Secondary | ICD-10-CM

## 2023-09-22 DIAGNOSIS — M351 Other overlap syndromes: Secondary | ICD-10-CM

## 2023-09-22 DIAGNOSIS — I2510 Atherosclerotic heart disease of native coronary artery without angina pectoris: Secondary | ICD-10-CM | POA: Diagnosis not present

## 2023-09-22 DIAGNOSIS — S3140XA Unspecified open wound of vagina and vulva, initial encounter: Secondary | ICD-10-CM

## 2023-09-22 DIAGNOSIS — S31109A Unspecified open wound of abdominal wall, unspecified quadrant without penetration into peritoneal cavity, initial encounter: Secondary | ICD-10-CM

## 2023-09-22 LAB — GLUCOSE, CAPILLARY
Glucose-Capillary: 133 mg/dL — ABNORMAL HIGH (ref 70–99)
Glucose-Capillary: 119 mg/dL — ABNORMAL HIGH (ref 70–99)
Glucose-Capillary: 175 mg/dL — ABNORMAL HIGH (ref 70–99)
Glucose-Capillary: 159 mg/dL — ABNORMAL HIGH (ref 70–99)
Glucose-Capillary: 127 mg/dL — ABNORMAL HIGH (ref 70–99)

## 2023-09-22 LAB — CBC WITH DIFFERENTIAL/PLATELET
Abs Immature Granulocytes: 0.02 10*3/uL (ref 0.00–0.07)
Basophils Absolute: 0.1 10*3/uL (ref 0.0–0.1)
Basophils Relative: 1 %
Eosinophils Absolute: 0.3 10*3/uL (ref 0.0–0.5)
Eosinophils Relative: 3 %
HCT: 39.9 % (ref 36.0–46.0)
Hemoglobin: 10.1 g/dL — ABNORMAL LOW (ref 12.0–15.0)
Immature Granulocytes: 0 %
Lymphocytes Relative: 23 %
Lymphs Abs: 1.8 10*3/uL (ref 0.7–4.0)
MCH: 19.1 pg — ABNORMAL LOW (ref 26.0–34.0)
MCHC: 25.3 g/dL — ABNORMAL LOW (ref 30.0–36.0)
MCV: 75.3 fL — ABNORMAL LOW (ref 80.0–100.0)
Monocytes Absolute: 0.6 10*3/uL (ref 0.1–1.0)
Monocytes Relative: 8 %
Neutro Abs: 5.2 10*3/uL (ref 1.7–7.7)
Neutrophils Relative %: 65 %
Platelets: 336 10*3/uL (ref 150–400)
RBC: 5.3 MIL/uL — ABNORMAL HIGH (ref 3.87–5.11)
RDW: 30.6 % — ABNORMAL HIGH (ref 11.5–15.5)
WBC: 7.9 10*3/uL (ref 4.0–10.5)
nRBC: 0 % (ref 0.0–0.2)

## 2023-09-22 LAB — COMPREHENSIVE METABOLIC PANEL
ALT: 23 U/L (ref 0–44)
AST: 50 U/L — ABNORMAL HIGH (ref 15–41)
Albumin: 3.3 g/dL — ABNORMAL LOW (ref 3.5–5.0)
Alkaline Phosphatase: 73 U/L (ref 38–126)
Anion gap: 15 (ref 5–15)
BUN: 23 mg/dL (ref 8–23)
CO2: 43 mmol/L — ABNORMAL HIGH (ref 22–32)
Calcium: 8.6 mg/dL — ABNORMAL LOW (ref 8.9–10.3)
Chloride: 77 mmol/L — ABNORMAL LOW (ref 98–111)
Creatinine, Ser: 1.31 mg/dL — ABNORMAL HIGH (ref 0.44–1.00)
GFR, Estimated: 46 mL/min — ABNORMAL LOW (ref >60)
Glucose, Bld: 142 mg/dL — ABNORMAL HIGH (ref 70–99)
Potassium: 3.3 mmol/L — ABNORMAL LOW (ref 3.5–5.1)
Sodium: 135 mmol/L (ref 135–145)
Total Bilirubin: 1.2 mg/dL (ref 0.0–1.2)
Total Protein: 7.4 g/dL (ref 6.5–8.1)

## 2023-09-22 LAB — MAGNESIUM: Magnesium: 1.9 mg/dL (ref 1.7–2.4)

## 2023-09-22 MED ORDER — FUROSEMIDE 10 MG/ML IJ SOLN
80.0000 mg | Freq: Every day | INTRAMUSCULAR | Status: DC
Start: 2023-09-23 — End: 2023-09-23
  Administered 2023-09-23: 80 mg via INTRAVENOUS
  Filled 2023-09-22: qty 8

## 2023-09-22 MED ORDER — PROMETHAZINE HCL 25 MG PO TABS
25.0000 mg | ORAL_TABLET | Freq: Four times a day (QID) | ORAL | Status: DC | PRN
  Administered 2023-09-22: 25 mg via ORAL
  Filled 2023-09-22: qty 1

## 2023-09-22 MED ORDER — POTASSIUM CHLORIDE CRYS ER 20 MEQ PO TBCR
40.0000 meq | EXTENDED_RELEASE_TABLET | Freq: Once | ORAL | Status: AC
  Administered 2023-09-22: 40 meq via ORAL
  Filled 2023-09-22: qty 2

## 2023-09-22 NOTE — Assessment & Plan Note (Signed)
 Sepsis resolved

## 2023-09-22 NOTE — Assessment & Plan Note (Signed)
 Patient been placed on Accu-Cheks before every meal and nightly with sliding scale insulin  Patient was transitioned from U-500 insulin  to 30 units of Semglee  nightly and 10 units of NovoLog  before every meal which has resulted in good glycemic control. Hemoglobin A1C 8.0% Diabetic Diet

## 2023-09-22 NOTE — Assessment & Plan Note (Signed)
 Thought to be secondary to acute on chronic diastolic congestive heart failure superimposed on likely obesity hypoventilation syndrome and OSA I believe while patient does still exhibit substantial anterior abdominal wall pitting edema and left lower extremity edema most of this edema is dependent edema or lymphedema and patient is approaching intravascular euvolemia. Creatinine remains somewhat elevated, cardiology recommending reducing Lasix  to once daily today. Encouraging continued use of BiPAP nightly

## 2023-09-22 NOTE — Assessment & Plan Note (Signed)
 No clinical evidence of ischemia Patient being evaluated for PCSK9 inhibitor as outpatient Discontinuing aspirin as patient is now on Eliquis for paroxysmal intrafibrillation.

## 2023-09-22 NOTE — Assessment & Plan Note (Signed)
 Hemoglobin stable Obtaining iron panel

## 2023-09-22 NOTE — Progress Notes (Signed)
   09/22/23 2000  BiPAP/CPAP/SIPAP  Reason BIPAP/CPAP not in use Non-compliant  BiPAP/CPAP /SiPAP Vitals  Resp (!) 21  MEWS Score/Color  MEWS Score 1  MEWS Score Color [place] Si

## 2023-09-22 NOTE — Assessment & Plan Note (Signed)
 Likely caused by broken down skin in the setting of severe peripheral edema.   Antibiotic course complete Compression wraps.

## 2023-09-22 NOTE — Assessment & Plan Note (Signed)
Continue daily dressing changes

## 2023-09-22 NOTE — Assessment & Plan Note (Signed)
-

## 2023-09-22 NOTE — Progress Notes (Signed)
 Rounding Note    Patient Name: [name] Date of Encounter: 09/22/2023  [facility] Cardiologist: Wilbert Bihari, MD   Subjective   Denies CP  Breathing is fair   + Back pain   Inpatient Medications    Scheduled Meds:  apixaban   5 mg Oral BID   arformoterol   15 mcg Nebulization BID   budesonide  (PULMICORT ) nebulizer solution  0.25 mg Nebulization BID   Chlorhexidine  Gluconate Cloth  6 each Topical Daily   diltiazem   300 mg Oral Daily   ferrous sulfate   325 mg Oral BID WC   fluticasone   1 spray Each Nare Daily   furosemide   80 mg Intravenous BID   gabapentin   400 mg Oral BID   gabapentin   800 mg Oral QHS   Gerhardt's butt cream   Topical BID   guaiFENesin   600 mg Oral BID   insulin  aspart  0-20 Units Subcutaneous TID AC & HS   insulin  aspart  10 Units Subcutaneous TID WC   insulin  glargine-yfgn  30 Units Subcutaneous QHS   levothyroxine   200 mcg Oral QAC breakfast   lidocaine   2 patch Transdermal Daily   mupirocin  ointment   Topical BID   nystatin    Topical TID   pantoprazole   40 mg Oral Daily   pramipexole   1 mg Oral TID   pregabalin   50 mg Oral TID   sodium chloride  flush  3 mL Intravenous Q12H   spironolactone   50 mg Oral Daily   Continuous Infusions:  PRN Meds: acetaminophen  **OR** acetaminophen , albuterol , ondansetron  **OR** ondansetron  (ZOFRAN ) IV, oxyCODONE , sodium chloride    Vital Signs    Vitals:   09/21/23 1323 09/21/23 2104 09/22/23 0410 09/22/23 0500  BP: (!) 111/58 105/63 113/66   Pulse: 83 84 81   Resp: 20 19 (!) 22   Temp: 98.2 F (36.8 C) 98.4 F (36.9 C) 98 F (36.7 C)   TempSrc: Oral Oral Oral   SpO2: 99% 96% 100%   Weight:    (!) 137.4 kg  Height:        Intake/Output Summary (Last 24 hours) at 09/22/2023 0836 Last data filed at 09/22/2023 0100 Gross per 24 hour  Intake --  Output 1400 ml  Net -1400 ml      09/22/2023    5:00 AM 09/21/2023    4:39 AM 09/20/2023    4:59 AM  Last 3 Weights  Weight (lbs) 302 lb 14.6  oz 301 lb 6.4 oz 317 lb 7.4 oz  Weight (kg) 137.4 kg 136.714 kg 144 kg      Telemetry   ? SR - Personally Reviewed  ECG    No new tracings - Personally Reviewed  Physical Exam   GEN: morbidly obese female.   Neck: Unable to assess JVP  Cardiac: RRR, no murmur Respiratory: Relatively CTA anteriorly  GI: Obese   Hard with pitting in lower quadrants  MS: right AKA, left lower leg wrapped with significant redness, swelling, and chronic skin changes  1+ edema    Labs    High Sensitivity Troponin:   Recent Labs  Lab 09/10/23 1434  TROPONINIHS 17     Chemistry Recent Labs  Lab 09/20/23 0339 09/21/23 0352 09/22/23 0359  NA 133* 133* 135  K 3.6 3.3* 3.3*  CL 81* 77* 77*  CO2 38* 41* 43*  GLUCOSE 212* 251* 142*  BUN 13 18 23   CREATININE 0.91 1.25* 1.31*  CALCIUM  8.4* 8.8* 8.6*  MG 2.1 2.1 1.9  PROT 7.1 8.0 7.4  ALBUMIN 3.1* 3.6 3.3*  AST 38 55* 50*  ALT 18 23 23   ALKPHOS 74 79 73  BILITOT 0.7 1.1 1.2  GFRNONAA >60 49* 46*  ANIONGAP 14 15 15     Lipids No results for input(s): CHOL, TRIG, HDL, LABVLDL, LDLCALC, CHOLHDL in the last 168 hours.  Hematology Recent Labs  Lab 09/19/23 0357 09/20/23 0339 09/22/23 0359  WBC 6.7 6.8 7.9  RBC 4.99 4.86 5.30*  HGB 9.3* 9.0* 10.1*  HCT 37.9 36.4 39.9  MCV 76.0* 74.9* 75.3*  MCH 18.6* 18.5* 19.1*  MCHC 24.5* 24.7* 25.3*  RDW 29.2* 29.7* 30.6*  PLT 280 262 336   Thyroid  No results for input(s): TSH, FREET4 in the last 168 hours.  BNPNo results for input(s): BNP, PROBNP in the last 168 hours.  DDimer No results for input(s): DDIMER in the last 168 hours.   Radiology    No results found.  Cardiac Studies   Echo 09/11/22: 1. Left ventricular ejection fraction, by estimation, is 60 to 65%. The  left ventricle has normal function. Left ventricular endocardial border  not optimally defined to evaluate regional wall motion. Left ventricular  diastolic parameters are  indeterminate.   2. RV not  well visualized, grossly appears normal in size and function. .  Right ventricular systolic function was not well visualized. The right  ventricular size is not well visualized. Tricuspid regurgitation signal is  inadequate for assessing PA  pressure.   3. The mitral valve was not well visualized. No evidence of mitral valve  regurgitation. No evidence of mitral stenosis.   4. The aortic valve was not well visualized. Aortic valve regurgitation  is not visualized. No aortic stenosis is present.   5. Technically difficult study, poor visualization.   Patient Profile     63 y.o. female with hypertension, hyperlipidemia, CAD s/p PCI, DM, morbid obesity, and non-adherence to medication admitted with cellulitis, acute on chronic HFpEF and hypoxia.   Assessment & Plan    HFpEF   Pt is diuresing with lasix  80 IV bid  Still with volume increase on exam Bump in Cr today    I would hold PM lasix     Keep daily for now   REassess labs in am     CAD with RCA PCI Pt without symptoms of angina   Hyperlipidemia - pt does not want to take statins - will need to reactivate referral for PCSK9i  PAF - appears to be probably in SR sinus rhythm on telemetry, - reported palpitations on BB - cardizem  increased to 300 mg daily previously - continue eliquis    For questions or updates, please contact [facility] Please consult [email or web address] for contact info under        Signed, Vina Gull, MD  09/22/2023, 8:36 AM

## 2023-09-22 NOTE — Plan of Care (Signed)
  Problem: Pain Management: Goal: General experience of comfort will improve Outcome: Progressing   Problem: Safety: Goal: Ability to remain free from injury will improve Outcome: Progressing   Problem: Education: Goal: Ability to describe self-care measures that may prevent or decrease complications (Diabetes Survival Skills Education) will improve Outcome: Progressing

## 2023-09-22 NOTE — Progress Notes (Signed)
   09/22/23 2318  BiPAP/CPAP/SIPAP  BiPAP/CPAP/SIPAP Pt Type Adult (tried to place pt on bipap per RN request pt started coughing and pulling mask told her to wear for few hours RN aware. Pt currently on bipap)  BiPAP/CPAP/SIPAP DREAMSTATIOND  Mask Type Full face mask  Mask Size Medium  Flow Rate 5 lpm  Patient Home Equipment No  Auto Titrate Yes (20/8 PS 8/5)  CPAP/SIPAP surface wiped down Yes  BiPAP/CPAP /SiPAP Vitals  Resp 18  SpO2 95 %  Bilateral Breath Sounds Diminished  MEWS Score/Color  MEWS Score 0  MEWS Score Color Landy

## 2023-09-22 NOTE — Progress Notes (Addendum)
 PROGRESS NOTE   [name]  FMW:[id number] DOB: 09-21-1960 DOA: 09/10/2023 PCP: [name], DO   Date of Service: the patient was seen and examined on 09/22/2023  Brief Narrative:  63 y.o. female with medical history significant of morbid obesity, right AKA, irritable bowel syndrome, HTN, HLD, DM, RA, asthma who presented to ED at her PCPs recommendation with worsening cellulitis of right breast.  Of note, patient initially presented to ED on 09/03/2023 and was diagnosed with left lower extremity as well as cellulitis under right breast and was discharged home on cefadroxil .    Due to patient's worsening symptoms of the right breast and left lower extremity patient presented to [facility] emergency department on 12/30.  Upon evaluation in the emergency department patient was found to exhibit multiple SIRS criteria with concerns for developing sepsis.  Patient was initiated on intravenous antibiotics and the hospitalist group was then called to assess the patient for admission to the hospital.  SIRS criteria eventually improved with intravenous antibiotic therapy and patient was switched from intravenous vancomycin  to Rocephin  with completion of 7 days of antibiotics while hospitalized.  Patient was noted to have multiple chronic wounds for which the wound care service assisted in wound care recommendations.  Hospital course was complicated by progressive hypoxia thought to be secondary to increasing volume overload of the course of the hospitalization.  Cardiology consult was obtained and it was felt that patient was likely suffering from acute diastolic congestive heart failure.  He was initiated on intravenous diuretics with some symptomatic improvement.   Furthermore, patient was also found to have intermittent bouts of paroxysmal atrial fibrillation throughout the hospitalization as well with no known history of atrial fibrillation in the past.  Patient was  initiated on Eliquis  5 mg twice daily.  Rate control was managed with Cardizem .     Assessment & Plan Acute on chronic respiratory failure with hypoxia and hypercapnia (HCC) Thought to be secondary to acute on chronic diastolic congestive heart failure superimposed on likely obesity hypoventilation syndrome and OSA I believe while patient does still exhibit substantial anterior abdominal wall pitting edema and left lower extremity edema most of this edema is dependent edema or lymphedema and patient is approaching intravascular euvolemia. Creatinine remains somewhat elevated, cardiology recommending reducing Lasix  to once daily today. Encouraging continued use of BiPAP nightly Acute on chronic diastolic CHF (congestive heart failure) (HCC) While patient is exhibiting substantial peripheral edema including pitting ventral abdominal wall edema and left lower extremity edema I believe that most of what remains is either dependent edema or lymphedema.  I believe patient is approaching intravascular euvolemia.   Creatinine remains somewhat elevated. Cardiology following and is recommending reducing Lasix  to once daily. Monitoring renal function and electrolytes with serial chemistries Paroxysmal atrial fibrillation (HCC) Controlled on diltiazem . Eliquis  for anticoagulation Cardiology following their input is appreciated. Hypokalemia Replacing with potassium chloride  Evaluating for concurrent hypomagnesemia  Monitoring potassium levels with serial chemistries.  Cellulitis of left lower extremity Likely caused by broken down skin in the setting of severe peripheral edema.   Antibiotic course complete Compression wraps. Type 2 diabetes mellitus with hyperglycemia, with long-term current use of insulin  (HCC) Patient been placed on Accu-Cheks before every meal and nightly with sliding scale insulin  Patient was transitioned from U-500 insulin  to 30 units of Semglee  nightly and 10 units of NovoLog   before every meal which has resulted in good glycemic control. Hemoglobin A1C 8.0% Diabetic Diet  CAD S/P percutaneous coronary angioplasty  No clinical evidence of ischemia Patient being evaluated for PCSK9 inhibitor as outpatient Discontinuing aspirin  as patient is now on Eliquis  for paroxysmal intrafibrillation. Cellulitis of right breast Antibiotic course complete Sepsis due to cellulitis (HCC) Sepsis resolved Gastroesophageal reflux disease Continue Protonix  Iron  deficiency anemia Hemoglobin stable Obtaining iron  panel Open wound of plantar aspect of left foot Continue daily dressing changes Open wound of genital labia Continue daily dressing changes Open abdominal wall wound, initial encounter Continue daily dressing changes Breast wound, left, initial encounter Continue daily dressing changes     Subjective:  Patient is complaining of continued nausea, severe in intensity, lasting 2 days.  Patient still complaining of generalized weakness and poor appetite.  Patient maintains that her shortness of breath is much improved.  Patient denies cough.  Patient states that her abdominal and left lower extremity swelling are improved.  Physical Exam:  Vitals:   09/22/23 1317 09/22/23 1832 09/22/23 2000 09/22/23 2100  BP: (!) 111/57   110/60  Pulse: 82   86  Resp:   (!) 21   Temp: 98.2 F (36.8 C)   97.9 F (36.6 C)  TempSrc: Oral   Oral  SpO2: 96% 95%  95%  Weight:      Height:         Constitutional: Lethargic but arousable and oriented x3, no associated distress.  Patient is obese. Skin: Dressings of the labia, abdominal wall, breast and left lower extremity are clean dry and intact with section of the dressing of the left lower extremity which is soiled with serosanguineous drainage. Eyes: Pupils are equally reactive to light.  No evidence of scleral icterus or conjunctival pallor.  ENMT: Moist mucous membranes noted.  Posterior pharynx clear of any exudate or  lesions.   Respiratory: clear to auscultation bilaterally, no wheezing, no crackles. Normal respiratory effort. No accessory muscle use.  Cardiovascular: Regular rate and rhythm, no murmurs / rubs / gallops. No extremity edema. 2+ pedal pulses. No carotid bruits.  Abdomen: Protuberant abdomen with pitting edema of the anterior abdominal wall.  Abdomen is firm to palpation.  No evidence of intra-abdominal masses.  Positive bowel sounds noted in all quadrants.   Musculoskeletal: Status post right AKA.  Improved pitting edema of the left lower extremity.  Good ROM, no contractures. Normal muscle tone.    Data Reviewed:  I have personally reviewed and interpreted labs, imaging.  Significant findings are   CBC: Recent Labs  Lab 09/18/23 0354 09/19/23 0357 09/20/23 0339 09/22/23 0359  WBC 7.2 6.7 6.8 7.9  NEUTROABS 4.6 4.6 4.6 5.2  HGB 9.0* 9.3* 9.0* 10.1*  HCT 34.9* 37.9 36.4 39.9  MCV 73.6* 76.0* 74.9* 75.3*  PLT 246 280 262 336   Basic Metabolic Panel: Recent Labs  Lab 09/18/23 0354 09/19/23 0357 09/20/23 0339 09/21/23 0352 09/22/23 0359  NA 136 133* 133* 133* 135  K 3.2* 3.8 3.6 3.3* 3.3*  CL 88* 85* 81* 77* 77*  CO2 36* 36* 38* 41* 43*  GLUCOSE 184* 172* 212* 251* 142*  BUN 11 10 13 18 23   CREATININE 0.75 0.78 0.91 1.25* 1.31*  CALCIUM  8.5* 8.4* 8.4* 8.8* 8.6*  MG  --   --  2.1 2.1 1.9     Code Status:  Full code.  Code status decision has been confirmed with: patient Family Communication: Plan of care discussed with husband at the bedside.   Severity of Illness:  The appropriate patient status for this patient is INPATIENT. Inpatient status is judged to  be reasonable and necessary in order to provide the required intensity of service to ensure the patient's safety. The patient's presenting symptoms, physical exam findings, and initial radiographic and laboratory data in the context of their chronic comorbidities is felt to place them at high risk for further  clinical deterioration. Furthermore, it is not anticipated that the patient will be medically stable for discharge from the hospital within 2 midnights of admission.   * I certify that at the point of admission it is my clinical judgment that the patient will require inpatient hospital care spanning beyond 2 midnights from the point of admission due to high intensity of service, high risk for further deterioration and high frequency of surveillance required.*  Time spent:  35 minutes  Author:  Zachary JINNY Ba MD  09/22/2023 9:51 PM

## 2023-09-22 NOTE — Assessment & Plan Note (Signed)
·   Replacing with potassium chloride °· Evaluating for concurrent hypomagnesemia  °· Monitoring potassium levels with serial chemistries. ° °

## 2023-09-22 NOTE — Assessment & Plan Note (Signed)
 Controlled on diltiazem. Eliquis for anticoagulation Cardiology following their input is appreciated.

## 2023-09-22 NOTE — Assessment & Plan Note (Signed)
 While patient is exhibiting substantial peripheral edema including pitting ventral abdominal wall edema and left lower extremity edema I believe that most of what remains is either dependent edema or lymphedema.  I believe patient is approaching intravascular euvolemia.   Creatinine remains somewhat elevated. Cardiology following and is recommending reducing Lasix  to once daily. Monitoring renal function and electrolytes with serial chemistries

## 2023-09-22 NOTE — Assessment & Plan Note (Signed)
Antibiotic course complete

## 2023-09-22 NOTE — Plan of Care (Signed)
  Problem: Fluid Volume: Goal: Hemodynamic stability will improve Outcome: Progressing   Problem: Clinical Measurements: Goal: Diagnostic test results will improve Outcome: Progressing Goal: Signs and symptoms of infection will decrease Outcome: Progressing   Problem: Respiratory: Goal: Ability to maintain adequate ventilation will improve Outcome: Progressing   Problem: Health Behavior/Discharge Planning: Goal: Ability to manage health-related needs will improve Outcome: Progressing   Problem: Clinical Measurements: Goal: Ability to maintain clinical measurements within normal limits will improve Outcome: Progressing Goal: Will remain free from infection Outcome: Progressing Goal: Diagnostic test results will improve Outcome: Progressing Goal: Respiratory complications will improve Outcome: Progressing Goal: Cardiovascular complication will be avoided Outcome: Progressing   Problem: Activity: Goal: Risk for activity intolerance will decrease Outcome: Progressing   Problem: Nutrition: Goal: Adequate nutrition will be maintained Outcome: Progressing   Problem: Coping: Goal: Level of anxiety will decrease Outcome: Progressing   Problem: Elimination: Goal: Will not experience complications related to bowel motility Outcome: Progressing Goal: Will not experience complications related to urinary retention Outcome: Progressing   Problem: Pain Management: Goal: General experience of comfort will improve Outcome: Progressing   Problem: Safety: Goal: Ability to remain free from injury will improve Outcome: Progressing   Problem: Skin Integrity: Goal: Risk for impaired skin integrity will decrease Outcome: Progressing   Problem: Education: Goal: Ability to describe self-care measures that may prevent or decrease complications (Diabetes Survival Skills Education) will improve Outcome: Progressing Goal: Individualized Educational Video(s) Outcome: Progressing    Problem: Coping: Goal: Ability to adjust to condition or change in health will improve Outcome: Progressing   Problem: Fluid Volume: Goal: Ability to maintain a balanced intake and output will improve Outcome: Progressing   Problem: Health Behavior/Discharge Planning: Goal: Ability to identify and utilize available resources and services will improve Outcome: Progressing Goal: Ability to manage health-related needs will improve Outcome: Progressing   Problem: Metabolic: Goal: Ability to maintain appropriate glucose levels will improve Outcome: Progressing   Problem: Nutritional: Goal: Maintenance of adequate nutrition will improve Outcome: Progressing Goal: Progress toward achieving an optimal weight will improve Outcome: Progressing   Problem: Skin Integrity: Goal: Risk for impaired skin integrity will decrease Outcome: Progressing   Problem: Tissue Perfusion: Goal: Adequacy of tissue perfusion will improve Outcome: Progressing

## 2023-09-23 DIAGNOSIS — L03116 Cellulitis of left lower limb: Secondary | ICD-10-CM | POA: Diagnosis not present

## 2023-09-23 LAB — GLUCOSE, CAPILLARY
Glucose-Capillary: 139 mg/dL — ABNORMAL HIGH (ref 70–99)
Glucose-Capillary: 133 mg/dL — ABNORMAL HIGH (ref 70–99)
Glucose-Capillary: 203 mg/dL — ABNORMAL HIGH (ref 70–99)
Glucose-Capillary: 198 mg/dL — ABNORMAL HIGH (ref 70–99)
Glucose-Capillary: 150 mg/dL — ABNORMAL HIGH (ref 70–99)

## 2023-09-23 LAB — COMPREHENSIVE METABOLIC PANEL
ALT: 21 U/L (ref 0–44)
AST: 40 U/L (ref 15–41)
Albumin: 3.3 g/dL — ABNORMAL LOW (ref 3.5–5.0)
Alkaline Phosphatase: 75 U/L (ref 38–126)
BUN: 25 mg/dL — ABNORMAL HIGH (ref 8–23)
CO2: >45 mmol/L — ABNORMAL HIGH (ref 22–32)
Calcium: 8.9 mg/dL (ref 8.9–10.3)
Chloride: 76 mmol/L — ABNORMAL LOW (ref 98–111)
Creatinine, Ser: 1.21 mg/dL — ABNORMAL HIGH (ref 0.44–1.00)
GFR, Estimated: 51 mL/min — ABNORMAL LOW (ref >60)
Glucose, Bld: 128 mg/dL — ABNORMAL HIGH (ref 70–99)
Potassium: 3.3 mmol/L — ABNORMAL LOW (ref 3.5–5.1)
Sodium: 136 mmol/L (ref 135–145)
Total Bilirubin: 0.9 mg/dL (ref 0.0–1.2)
Total Protein: 7.6 g/dL (ref 6.5–8.1)

## 2023-09-23 LAB — MAGNESIUM: Magnesium: 2.2 mg/dL (ref 1.7–2.4)

## 2023-09-23 MED ORDER — POTASSIUM CHLORIDE CRYS ER 20 MEQ PO TBCR
40.0000 meq | EXTENDED_RELEASE_TABLET | Freq: Once | ORAL | Status: AC
  Administered 2023-09-23: 40 meq via ORAL
  Filled 2023-09-23: qty 2

## 2023-09-23 MED ORDER — FUROSEMIDE 10 MG/ML IJ SOLN
80.0000 mg | Freq: Two times a day (BID) | INTRAMUSCULAR | Status: DC
  Administered 2023-09-23 – 2023-09-27 (×8): 80 mg via INTRAVENOUS
  Filled 2023-09-23 (×8): qty 8

## 2023-09-23 MED ORDER — PROMETHAZINE HCL 25 MG PO TABS
12.5000 mg | ORAL_TABLET | Freq: Four times a day (QID) | ORAL | Status: DC | PRN
  Administered 2023-09-23: 12.5 mg via ORAL
  Filled 2023-09-23: qty 1

## 2023-09-23 NOTE — Progress Notes (Signed)
 Rounding Note    Patient Name: [name] Date of Encounter: 09/23/2023  [facility] Cardiologist: Wilbert Bihari, MD   Subjective   Bruna says swelling improved, abdomen not as hard   Breathing is fair   Inpatient Medications    Scheduled Meds:  apixaban   5 mg Oral BID   arformoterol   15 mcg Nebulization BID   budesonide  (PULMICORT ) nebulizer solution  0.25 mg Nebulization BID   Chlorhexidine  Gluconate Cloth  6 each Topical Daily   diltiazem   300 mg Oral Daily   ferrous sulfate   325 mg Oral BID WC   fluticasone   1 spray Each Nare Daily   furosemide   80 mg Intravenous Daily   gabapentin   400 mg Oral BID   gabapentin   800 mg Oral QHS   Gerhardt's butt cream   Topical BID   guaiFENesin   600 mg Oral BID   insulin  aspart  0-20 Units Subcutaneous TID AC & HS   insulin  aspart  10 Units Subcutaneous TID WC   insulin  glargine-yfgn  30 Units Subcutaneous QHS   levothyroxine   200 mcg Oral QAC breakfast   lidocaine   2 patch Transdermal Daily   mupirocin  ointment   Topical BID   nystatin    Topical TID   pantoprazole   40 mg Oral Daily   pramipexole   1 mg Oral TID   pregabalin   50 mg Oral TID   sodium chloride  flush  3 mL Intravenous Q12H   spironolactone   50 mg Oral Daily   Continuous Infusions:  PRN Meds: acetaminophen  **OR** acetaminophen , albuterol , oxyCODONE , promethazine , sodium chloride    Vital Signs    Vitals:   09/22/23 2318 09/23/23 0039 09/23/23 0427 09/23/23 0500  BP:   104/63   Pulse:   83   Resp: 18 12 16    Temp:   98.4 F (36.9 C)   TempSrc:   Oral   SpO2: 95%  90%   Weight:    133.6 kg  Height:        Intake/Output Summary (Last 24 hours) at 09/23/2023 9347 Last data filed at 09/23/2023 0600 Gross per 24 hour  Intake 570 ml  Output 1950 ml  Net -1380 ml      09/23/2023    5:00 AM 09/22/2023    5:00 AM 09/21/2023    4:39 AM  Last 3 Weights  Weight (lbs) 294 lb 8.6 oz 302 lb 14.6 oz 301 lb 6.4 oz  Weight (kg) 133.6 kg 137.4 kg 136.714  kg      Telemetry  Sinus Rhythm and Afib   Rate 80s  - Personally Reviewed  ECG    No new tracings - Personally Reviewed  Physical Exam   GEN: morbidly obese female.   Neck: Unable to assess JVP  Cardiac: RRR,  No murmurs    Respiratory: Clear anterirly   GI: Obese   Lower quadrants still hard but improved   MS: right AKA, left lower leg wrapped with significant redness, swelling, and chronic skin changes  1+ edema   Wrinkled skin     Labs    High Sensitivity Troponin:   Recent Labs  Lab 09/10/23 1434  TROPONINIHS 17     Chemistry Recent Labs  Lab 09/21/23 0352 09/22/23 0359 09/23/23 0424  NA 133* 135 136  K 3.3* 3.3* 3.3*  CL 77* 77* 76*  CO2 41* 43* >45*  GLUCOSE 251* 142* 128*  BUN 18 23 25*  CREATININE 1.25* 1.31* 1.21*  CALCIUM  8.8* 8.6* 8.9  MG 2.1 1.9 2.2  PROT 8.0 7.4 7.6  ALBUMIN 3.6 3.3* 3.3*  AST 55* 50* 40  ALT 23 23 21   ALKPHOS 79 73 75  BILITOT 1.1 1.2 0.9  GFRNONAA 49* 46* 51*  ANIONGAP 15 15 NOT CALCULATED    Lipids No results for input(s): CHOL, TRIG, HDL, LABVLDL, LDLCALC, CHOLHDL in the last 168 hours.  Hematology Recent Labs  Lab 09/19/23 0357 09/20/23 0339 09/22/23 0359  WBC 6.7 6.8 7.9  RBC 4.99 4.86 5.30*  HGB 9.3* 9.0* 10.1*  HCT 37.9 36.4 39.9  MCV 76.0* 74.9* 75.3*  MCH 18.6* 18.5* 19.1*  MCHC 24.5* 24.7* 25.3*  RDW 29.2* 29.7* 30.6*  PLT 280 262 336   Thyroid  No results for input(s): TSH, FREET4 in the last 168 hours.  BNPNo results for input(s): BNP, PROBNP in the last 168 hours.  DDimer No results for input(s): DDIMER in the last 168 hours.   Radiology    No results found.  Cardiac Studies   Echo 09/11/22: 1. Left ventricular ejection fraction, by estimation, is 60 to 65%. The  left ventricle has normal function. Left ventricular endocardial border  not optimally defined to evaluate regional wall motion. Left ventricular  diastolic parameters are  indeterminate.   2. RV not well  visualized, grossly appears normal in size and function. .  Right ventricular systolic function was not well visualized. The right  ventricular size is not well visualized. Tricuspid regurgitation signal is  inadequate for assessing PA  pressure.   3. The mitral valve was not well visualized. No evidence of mitral valve  regurgitation. No evidence of mitral stenosis.   4. The aortic valve was not well visualized. Aortic valve regurgitation  is not visualized. No aortic stenosis is present.   5. Technically difficult study, poor visualization.   Patient Profile     63 y.o. female with hypertension, hyperlipidemia, CAD s/p PCI, DM, morbid obesity, and non-adherence to medication admitted with cellulitis, acute on chronic HFpEF and hypoxia.   Assessment & Plan    HFpEF   Pt is diuresing with lasix  80 IV bid  Still with volume increase on exam  Sats are marginal this am on 4 L  BUmped t o5 transiently    Bump in Cr yesterday  Held PM dose   Cr 1.2 today I would continue with IV lasix  today  bid        CAD  Hx PTCA/DES to RCA in 2019   mild dz elsewhere  Pt without symptoms of angina   Hyperlipidemia - pt does not want to take statins - will need to reactivate referral for PCSK9i after d/c   PAF -Pt back and forth SR / afib   Rates OK   - cardizem  increased to 300 mg daily previously - continue eliquis   For questions or updates, please contact [facility] Please consult [email or web address] for contact info under        Signed, Vina Gull, MD  09/23/2023, 6:52 AM

## 2023-09-23 NOTE — Progress Notes (Signed)
 Occupational Therapy Treatment Patient Details Name: [name] MRN: [id number] DOB: 1961-08-08 Today's Date: 09/23/2023   History of present illness [name] is a 63 y.o.presented 09/10/23 from PCP for worsening Right breast cellulitis, LLE cellulitis. PMH: morbid obesity, right AKA, irritable bowel syndrome, hypertension, hyperlipidemia, hypothyroidism, type 2 diabetes mellitus, rheumatoid arthritis, asthma   OT comments  Patient progressing and showed improved overall mobility with ability to perform 1 stand from EOB to her own bariatric RW with Moderate assist, compared to previous session where pt was unable to stand.   Pt also showing overall progress towards OT goals as evidenced by an increased score on 6-Clicks AM-PAC measure of occupational Performance with previous score of 11/24, and current score of 14/24.   Patient remains limited by body habitus, h/o RT AKA, mild cognitive deficits, RT shoulder pain, generalized weakness and decreased activity tolerance along with deficits noted below. Pt continues to demonstrate good rehab potential and would benefit from continued skilled OT to increase safety and independence with ADLs and functional transfers to allow pt to return home safely and reduce caregiver burden and fall risk.   11    If plan is discharge home, recommend the following:  Two people to help with walking and/or transfers;Two people to help with bathing/dressing/bathroom;Assistance with cooking/housework;Direct supervision/assist for medications management;Assist for transportation;Help with stairs or ramp for entrance;Direct supervision/assist for financial management   Equipment Recommendations  None recommended by OT    Recommendations for Other Services      Precautions / Restrictions Precautions Precautions: Fall Precaution Comments: R AKA, right shoulder pain ? RTC injury/, desats Restrictions Weight Bearing Restrictions Per Provider Order:  No Other Position/Activity Restrictions: RT AKA with sore so has been unable to use prosthesis       Mobility Bed Mobility Overal bed mobility: Needs Assistance       Supine to sit: Contact guard, Supervision, HOB elevated, Used rails Sit to supine: HOB elevated, Used rails, Mod assist        Transfers                         Balance Overall balance assessment: Needs assistance, History of Falls Sitting-balance support: Single extremity supported, Feet supported Sitting balance-[name]: Good     Standing balance support: Bilateral upper extremity supported, During functional activity, Reliant on assistive device for balance Standing balance-[name]: Poor Standing balance comment: Could not stand long due to RT shoulder pain                           ADL either performed or assessed with clinical judgement   ADL Overall ADL's : Needs assistance/impaired Eating/Feeding: Independent;Sitting   Grooming: Sitting;Wash/dry hands;Set up           Upper Body Dressing : Sitting;Minimal assistance   Lower Body Dressing: Bed level;Total assistance Lower Body Dressing Details (indicate cue type and reason): Total Assist to don LT sock. Toilet Transfer: Moderate assistance;Rolling walker (2 wheels);Cueing for safety;Cueing for sequencing Toilet Transfer Details (indicate cue type and reason): Pt attemtped several ways to stand from EOB as this was her stated goal for today. Pt did not like the Stedy which was ill-fitting for her hips although weight approved. Pt did not like trying to pull up on back of recliner due to RT shoulder pain and feeling a lack of leverage. Pt had her own Bariatric RW in the bathroom which  husband brought out.  Pt performed 1 stand up from low EOB to RW with Mod As of 1 person, cues and poor tolerance to standing due to RT shoulder pain once WB through it on RW. Pt then initiated sitting back to EOB. Pt declined another stand due  to this. Toileting- Clothing Manipulation and Hygiene: Bed level;Total assistance              Extremity/Trunk Assessment Upper Extremity Assessment Upper Extremity Assessment: RUE deficits/detail RUE Deficits / Details: patient reporting pain in shoulder able to FF about 70 degrees with increased grimancing. grip strength WFL. See exercises.            Vision   Vision Assessment?: No apparent visual deficits   Perception     Praxis      Cognition Arousal: Alert Behavior During Therapy: WFL for tasks assessed/performed Overall Cognitive Status: Within Functional Limits for tasks assessed                                 General Comments: Some decreased processing but mentation much improved since last seen by OT.        Exercises Other Exercises Other Exercises: Pt has been using resistance bands on RT UE in attemps to strengthen shoulder and confirmed this is painful.  Pt assessed and found to have tenderness to palpation with all borders of scapular, and anterior GH capsule. Pt with tight upper traps and levator scapulae. Gentle manual scapular mobs to RT side initiated with pt tolerating well and some glide felt but limited.  Pt performed shoulder shrugs and scapular retractionsfollowed by scapular add/abd using low rowing exercises while EOB without increased pain. Pt performed both upper trap and levator stretches with 15 second holds each. Other Exercises: Pt instructed to discontinue theraband for now due to continued pain. Instructed in above exercises to begin losening tighness all around shoulder joint/neck/upper back.  Pt tested negative for Hawkins-Kennedy and empy can. Other Exercises: May be able to perform isometrics to RT shoulder is submaximal and without increased pain.    Shoulder Instructions       General Comments      Pertinent Vitals/ Pain       Pain Assessment Pain Assessment: Faces Faces Pain Scale: Hurts even more Pain  Location: right shoulder with RW WB, Pain Descriptors / Indicators: Grimacing, Guarding Pain Intervention(s): Limited activity within patient's tolerance, Monitored during session, Repositioned  Home Living                                          Prior Functioning/Environment              Frequency  Min 1X/week        Progress Toward Goals  OT Goals(current goals can now be found in the care plan section)  Progress towards OT goals: Progressing toward goals  Acute Rehab OT Goals Patient Stated Goal: Im ready to try a stand. OT Goal Formulation: With patient/family Time For Goal Achievement: 09/26/23 Potential to Achieve Goals: Good  Plan      Co-evaluation                 AM-PAC OT 6 Clicks Daily Activity     Outcome Measure   Help from another person eating meals?: None Help from another person  taking care of personal grooming?: A Little Help from another person toileting, which includes using toliet, bedpan, or urinal?: Total Help from another person bathing (including washing, rinsing, drying)?: A Lot Help from another person to put on and taking off regular upper body clothing?: A Little Help from another person to put on and taking off regular lower body clothing?: Total 6 Click Score: 14    End of Session Equipment Utilized During Treatment: Gait belt;Rolling walker (2 wheels)  OT Visit Diagnosis: Pain;Unsteadiness on feet (R26.81);History of falling (Z91.81) Pain - Right/Left: Right Pain - part of body: Shoulder   Activity Tolerance Patient limited by pain;Patient limited by fatigue   Patient Left in bed;with call bell/phone within reach;with family/visitor present;with bed alarm set   Nurse Communication Mobility status        Time: 8574-8490 OT Time Calculation (min): 44 min  Charges: OT General Charges $OT Visit: 1 Visit OT Treatments $Therapeutic Activity: 23-37 mins $Therapeutic Exercise: 8-22  mins  Delon, OT Acute Rehab Services Office: [phone] 09/23/2023   Delon Falter 09/23/2023, 4:35 PM

## 2023-09-23 NOTE — Plan of Care (Signed)
  Problem: Fluid Volume: Goal: Hemodynamic stability will improve Outcome: Progressing   Problem: Clinical Measurements: Goal: Diagnostic test results will improve Outcome: Progressing Goal: Signs and symptoms of infection will decrease Outcome: Progressing   Problem: Respiratory: Goal: Ability to maintain adequate ventilation will improve Outcome: Progressing   Problem: Health Behavior/Discharge Planning: Goal: Ability to manage health-related needs will improve Outcome: Progressing   Problem: Clinical Measurements: Goal: Ability to maintain clinical measurements within normal limits will improve Outcome: Progressing Goal: Will remain free from infection Outcome: Progressing Goal: Diagnostic test results will improve Outcome: Progressing Goal: Respiratory complications will improve Outcome: Progressing Goal: Cardiovascular complication will be avoided Outcome: Progressing   Problem: Activity: Goal: Risk for activity intolerance will decrease Outcome: Progressing   Problem: Nutrition: Goal: Adequate nutrition will be maintained Outcome: Progressing   Problem: Coping: Goal: Level of anxiety will decrease Outcome: Progressing   Problem: Elimination: Goal: Will not experience complications related to bowel motility Outcome: Progressing Goal: Will not experience complications related to urinary retention Outcome: Progressing   Problem: Pain Management: Goal: General experience of comfort will improve Outcome: Progressing   Problem: Safety: Goal: Ability to remain free from injury will improve Outcome: Progressing   Problem: Skin Integrity: Goal: Risk for impaired skin integrity will decrease Outcome: Progressing   Problem: Education: Goal: Ability to describe self-care measures that may prevent or decrease complications (Diabetes Survival Skills Education) will improve Outcome: Progressing Goal: Individualized Educational Video(s) Outcome: Progressing    Problem: Coping: Goal: Ability to adjust to condition or change in health will improve Outcome: Progressing   Problem: Fluid Volume: Goal: Ability to maintain a balanced intake and output will improve Outcome: Progressing   Problem: Health Behavior/Discharge Planning: Goal: Ability to identify and utilize available resources and services will improve Outcome: Progressing Goal: Ability to manage health-related needs will improve Outcome: Progressing   Problem: Metabolic: Goal: Ability to maintain appropriate glucose levels will improve Outcome: Progressing   Problem: Nutritional: Goal: Maintenance of adequate nutrition will improve Outcome: Progressing Goal: Progress toward achieving an optimal weight will improve Outcome: Progressing   Problem: Skin Integrity: Goal: Risk for impaired skin integrity will decrease Outcome: Progressing   Problem: Tissue Perfusion: Goal: Adequacy of tissue perfusion will improve Outcome: Progressing

## 2023-09-23 NOTE — Progress Notes (Signed)
 Patient wore bipap/cpap for only about two hours. Refused after that.

## 2023-09-24 DIAGNOSIS — L03116 Cellulitis of left lower limb: Secondary | ICD-10-CM | POA: Diagnosis not present

## 2023-09-24 DIAGNOSIS — I2510 Atherosclerotic heart disease of native coronary artery without angina pectoris: Secondary | ICD-10-CM | POA: Diagnosis not present

## 2023-09-24 DIAGNOSIS — E1165 Type 2 diabetes mellitus with hyperglycemia: Secondary | ICD-10-CM | POA: Diagnosis not present

## 2023-09-24 DIAGNOSIS — I5033 Acute on chronic diastolic (congestive) heart failure: Secondary | ICD-10-CM | POA: Diagnosis not present

## 2023-09-24 LAB — BASIC METABOLIC PANEL
Anion gap: 15 (ref 5–15)
BUN: 22 mg/dL (ref 8–23)
CO2: 42 mmol/L — ABNORMAL HIGH (ref 22–32)
Calcium: 8.7 mg/dL — ABNORMAL LOW (ref 8.9–10.3)
Chloride: 74 mmol/L — ABNORMAL LOW (ref 98–111)
Creatinine, Ser: 1.15 mg/dL — ABNORMAL HIGH (ref 0.44–1.00)
GFR, Estimated: 54 mL/min — ABNORMAL LOW (ref >60)
Glucose, Bld: 182 mg/dL — ABNORMAL HIGH (ref 70–99)
Potassium: 3.3 mmol/L — ABNORMAL LOW (ref 3.5–5.1)
Sodium: 131 mmol/L — ABNORMAL LOW (ref 135–145)

## 2023-09-24 LAB — CBC WITH DIFFERENTIAL/PLATELET
Abs Immature Granulocytes: 0.05 10*3/uL (ref 0.00–0.07)
Basophils Absolute: 0.1 10*3/uL (ref 0.0–0.1)
Basophils Relative: 1 %
Eosinophils Absolute: 0.4 10*3/uL (ref 0.0–0.5)
Eosinophils Relative: 5 %
HCT: 43.8 % (ref 36.0–46.0)
Hemoglobin: 11.4 g/dL — ABNORMAL LOW (ref 12.0–15.0)
Immature Granulocytes: 1 %
Lymphocytes Relative: 25 %
Lymphs Abs: 2.1 10*3/uL (ref 0.7–4.0)
MCH: 20 pg — ABNORMAL LOW (ref 26.0–34.0)
MCHC: 26 g/dL — ABNORMAL LOW (ref 30.0–36.0)
MCV: 77 fL — ABNORMAL LOW (ref 80.0–100.0)
Monocytes Absolute: 0.7 10*3/uL (ref 0.1–1.0)
Monocytes Relative: 9 %
Neutro Abs: 5 10*3/uL (ref 1.7–7.7)
Neutrophils Relative %: 59 %
Platelets: 384 10*3/uL (ref 150–400)
RBC: 5.69 MIL/uL — ABNORMAL HIGH (ref 3.87–5.11)
RDW: 32.1 % — ABNORMAL HIGH (ref 11.5–15.5)
WBC: 8.3 10*3/uL (ref 4.0–10.5)
nRBC: 0 % (ref 0.0–0.2)

## 2023-09-24 LAB — GLUCOSE, CAPILLARY
Glucose-Capillary: 162 mg/dL — ABNORMAL HIGH (ref 70–99)
Glucose-Capillary: 143 mg/dL — ABNORMAL HIGH (ref 70–99)
Glucose-Capillary: 216 mg/dL — ABNORMAL HIGH (ref 70–99)
Glucose-Capillary: 160 mg/dL — ABNORMAL HIGH (ref 70–99)
Glucose-Capillary: 295 mg/dL — ABNORMAL HIGH (ref 70–99)

## 2023-09-24 LAB — MAGNESIUM: Magnesium: 2.1 mg/dL (ref 1.7–2.4)

## 2023-09-24 MED ORDER — POTASSIUM CHLORIDE CRYS ER 20 MEQ PO TBCR
40.0000 meq | EXTENDED_RELEASE_TABLET | ORAL | Status: AC
  Administered 2023-09-24 (×2): 40 meq via ORAL
  Filled 2023-09-24 (×2): qty 2

## 2023-09-24 NOTE — Assessment & Plan Note (Signed)
 Likely caused by broken down skin in the setting of severe peripheral edema.   Antibiotic course complete Compression wraps of the left lower extremity.

## 2023-09-24 NOTE — Assessment & Plan Note (Signed)
Continue daily dressing changes

## 2023-09-24 NOTE — Progress Notes (Signed)
   09/24/23 2259  BiPAP/CPAP/SIPAP  BiPAP/CPAP/SIPAP Pt Type Adult  BiPAP/CPAP/SIPAP DREAMSTATIOND  Mask Type Full face mask  Mask Size Medium  Respiratory Rate 18 breaths/min  Flow Rate 5 lpm  Patient Home Equipment No  Auto Titrate Yes (autobipap, Imax:20cm, Emin:5cm, PS5-8)  CPAP/SIPAP surface wiped down Yes  BiPAP/CPAP /SiPAP Vitals  Pulse Rate 84  SpO2 96 %

## 2023-09-24 NOTE — Progress Notes (Signed)
 Progress Note  Patient Name: [name] Date of Encounter: 09/24/2023  Primary Cardiologist: Wilbert Bihari, MD  Subjective   Feeling like she is making progress. Abdomen and left leg feel less tight. Thinks she could lay flat for RHC if needed.  Inpatient Medications    Scheduled Meds:  apixaban   5 mg Oral BID   arformoterol   15 mcg Nebulization BID   budesonide  (PULMICORT ) nebulizer solution  0.25 mg Nebulization BID   Chlorhexidine  Gluconate Cloth  6 each Topical Daily   diltiazem   300 mg Oral Daily   ferrous sulfate   325 mg Oral BID WC   fluticasone   1 spray Each Nare Daily   furosemide   80 mg Intravenous BID   gabapentin   400 mg Oral BID   gabapentin   800 mg Oral QHS   Gerhardt's butt cream   Topical BID   guaiFENesin   600 mg Oral BID   insulin  aspart  0-20 Units Subcutaneous TID AC & HS   insulin  aspart  10 Units Subcutaneous TID WC   insulin  glargine-yfgn  30 Units Subcutaneous QHS   levothyroxine   200 mcg Oral QAC breakfast   lidocaine   2 patch Transdermal Daily   mupirocin  ointment   Topical BID   nystatin    Topical TID   pantoprazole   40 mg Oral Daily   pramipexole   1 mg Oral TID   pregabalin   50 mg Oral TID   sodium chloride  flush  3 mL Intravenous Q12H   spironolactone   50 mg Oral Daily   Continuous Infusions:  PRN Meds: acetaminophen  **OR** acetaminophen , albuterol , oxyCODONE , promethazine , sodium chloride    Vital Signs    Vitals:   09/24/23 0249 09/24/23 0500 09/24/23 0759 09/24/23 0823  BP: 113/64   131/81  Pulse: 86   86  Resp: 16     Temp: 98.5 F (36.9 C)   98.3 F (36.8 C)  TempSrc: Axillary   Oral  SpO2: 90%  96% 90%  Weight:  131.9 kg    Height:        Intake/Output Summary (Last 24 hours) at 09/24/2023 1153 Last data filed at 09/24/2023 0915 Gross per 24 hour  Intake 480 ml  Output 3950 ml  Net -3470 ml      09/24/2023    5:00 AM 09/23/2023    5:00 AM 09/22/2023    5:00 AM  Last 3 Weights  Weight (lbs) 290 lb 12.6 oz 294 lb  8.6 oz 302 lb 14.6 oz  Weight (kg) 131.9 kg 133.6 kg 137.4 kg     Telemetry    Intermittent bouts of AF interspersed with NSR - difficult P wave amplitude - Personally Reviewed  Physical Exam   GEN: No acute distress.  HEENT: Normocephalic, atraumatic, sclera non-icteric. Neck: No JVD or bruits. Cardiac: RRR no murmurs, rubs, or gallops.  Respiratory: Decreased BS bilaterally. Breathing is unlabored. GI: Soft, rounded obese pannus, BS +x 4. MS: no deformity. Extremities: No clubbing or cyanosis. S/p R AKA. LLE is erythematous and wrapped with edema Neuro:  AAOx3. Follows commands. Psych:  Responds to questions appropriately with a normal affect.  Labs    High Sensitivity Troponin:   Recent Labs  Lab 09/10/23 1434  TROPONINIHS 17      Cardiac EnzymesNo results for input(s): TROPONINI in the last 168 hours. No results for input(s): TROPIPOC in the last 168 hours.   Chemistry Recent Labs  Lab 09/21/23 0352 09/22/23 0359 09/23/23 0424 09/24/23 1031  NA 133* 135 136 131*  K 3.3* 3.3* 3.3* 3.3*  CL 77* 77* 76* 74*  CO2 41* 43* >45* 42*  GLUCOSE 251* 142* 128* 182*  BUN 18 23 25* 22  CREATININE 1.25* 1.31* 1.21* 1.15*  CALCIUM  8.8* 8.6* 8.9 8.7*  PROT 8.0 7.4 7.6  --   ALBUMIN 3.6 3.3* 3.3*  --   AST 55* 50* 40  --   ALT 23 23 21   --   ALKPHOS 79 73 75  --   BILITOT 1.1 1.2 0.9  --   GFRNONAA 49* 46* 51* 54*  ANIONGAP 15 15 NOT CALCULATED 15     Hematology Recent Labs  Lab 09/20/23 0339 09/22/23 0359 09/24/23 1031  WBC 6.8 7.9 8.3  RBC 4.86 5.30* 5.69*  HGB 9.0* 10.1* 11.4*  HCT 36.4 39.9 43.8  MCV 74.9* 75.3* 77.0*  MCH 18.5* 19.1* 20.0*  MCHC 24.7* 25.3* 26.0*  RDW 29.7* 30.6* 32.1*  PLT 262 336 384    BNPNo results for input(s): BNP, PROBNP in the last 168 hours.   DDimer No results for input(s): DDIMER in the last 168 hours.   Radiology    No results found.  Cardiac Studies   2d echo 09/12/23   1. Left ventricular ejection  fraction, by estimation, is 60 to 65%. The  left ventricle has normal function. Left ventricular endocardial border  not optimally defined to evaluate regional wall motion. Left ventricular  diastolic parameters are  indeterminate.   2. RV not well visualized, grossly appears normal in size and function. .  Right ventricular systolic function was not well visualized. The right  ventricular size is not well visualized. Tricuspid regurgitation signal is  inadequate for assessing PA  pressure.   3. The mitral valve was not well visualized. No evidence of mitral valve  regurgitation. No evidence of mitral stenosis.   4. The aortic valve was not well visualized. Aortic valve regurgitation  is not visualized. No aortic stenosis is present.   5. Technically difficult study, poor visualization.    Patient Profile   63 y.o. female with CAD s/p DES to RCA 2017 with repeat DES to ostial RCA 2019, PAF in setting of sepsis/bacteremia, intolerance of BB in the past due to wheezing, uncontrolled DM, morbid obesity (prior suspected OSA/OHS), HTN, right AKA, hyperkalemia with ARB, medication noncompliance, chronic HFpEF admitted with cellulitis, acute on chronic HFpEF and hypoxia.   Assessment & Plan   1. Acute on chronic diastolic heart failure - has been diuresed with IV lasix  (increased to 80mg  BID on 1/12) and spironolactone  50mg , -26.5L, weight 347->290lb - echo this admission with preserved LVEF, not clear images of RV, but normal RV function on TEE in 2022 - may ultimately need a RHC to determine if pulmonary hypertension contirbuting and heart pressures -> will review timing and rx with MD - - additional issues include hyponatremia, hypokalemia, hypochloremia, hypercarbia - GDMT: no ARB due to hyperkalemia, but tolerating spironolactone , no BB due to wheezing - K remains 3.3 despite yesterday, will give 40meq x 2 doses today - avoid SGLT2 given wound issues - LLE cellulitis, R breast  cellulitis, genital labia wound, abdominal wall wound, left breast wound    2. Persistent hypoxia - continues to require O2 despite IV diuresis, may be chronic need at this point - echo this admission as above - volume status difficult given body habitus - suspect a component of OSA and OHS, she has not tolerated CPAP, likely a component of PH, will need eval for  home O2 at DC   3. PAF - in and out this admission per notes - anticoagulation initiated - diltiazem  titrated to 300mg  daily, seems to be holding NSR today, rates controlled (p wave amplitude challenging on telemetry) - avoid BB due to wheezing with this in the past - reports recent palpitations - management of recent abnormal TSH per primary team   4. CAD - prior PCI/DES to distal RCA and ostial RCA, last ischemic evaluation  - she denies chest pain - ASA discontinued this admission given concomitant Eliquis  - does not appear she followed up for PCSK9i, she preferred to avoid statins, can re-engage as outpatient - had MSK chest pain this admission, not felt to have ACS, improved   5. Microcytic anemia - per primary team    For questions or updates, please contact [facility] Please consult [email or web address] for contact info under Cardiology/STEMI.  Signed, Keldan Eplin N Zayyan Mullen, PA-C 09/24/2023, 11:53 AM

## 2023-09-24 NOTE — Assessment & Plan Note (Signed)
 No clinical evidence of ischemia Patient being evaluated for PCSK9 inhibitor as outpatient Discontinuing aspirin as patient is now on Eliquis for paroxysmal intrafibrillation.

## 2023-09-24 NOTE — Plan of Care (Signed)
  Problem: Fluid Volume: Goal: Hemodynamic stability will improve Outcome: Progressing   Problem: Clinical Measurements: Goal: Diagnostic test results will improve Outcome: Progressing Goal: Signs and symptoms of infection will decrease Outcome: Progressing   Problem: Respiratory: Goal: Ability to maintain adequate ventilation will improve Outcome: Progressing   Problem: Health Behavior/Discharge Planning: Goal: Ability to manage health-related needs will improve Outcome: Progressing   Problem: Clinical Measurements: Goal: Ability to maintain clinical measurements within normal limits will improve Outcome: Progressing Goal: Will remain free from infection Outcome: Progressing Goal: Diagnostic test results will improve Outcome: Progressing Goal: Respiratory complications will improve Outcome: Progressing Goal: Cardiovascular complication will be avoided Outcome: Progressing   Problem: Activity: Goal: Risk for activity intolerance will decrease Outcome: Progressing   Problem: Nutrition: Goal: Adequate nutrition will be maintained Outcome: Progressing   Problem: Coping: Goal: Level of anxiety will decrease Outcome: Progressing   Problem: Elimination: Goal: Will not experience complications related to bowel motility Outcome: Progressing Goal: Will not experience complications related to urinary retention Outcome: Progressing   Problem: Pain Management: Goal: General experience of comfort will improve Outcome: Progressing   Problem: Safety: Goal: Ability to remain free from injury will improve Outcome: Progressing   Problem: Skin Integrity: Goal: Risk for impaired skin integrity will decrease Outcome: Progressing   Problem: Education: Goal: Ability to describe self-care measures that may prevent or decrease complications (Diabetes Survival Skills Education) will improve Outcome: Progressing Goal: Individualized Educational Video(s) Outcome: Progressing    Problem: Coping: Goal: Ability to adjust to condition or change in health will improve Outcome: Progressing   Problem: Fluid Volume: Goal: Ability to maintain a balanced intake and output will improve Outcome: Progressing   Problem: Health Behavior/Discharge Planning: Goal: Ability to identify and utilize available resources and services will improve Outcome: Progressing Goal: Ability to manage health-related needs will improve Outcome: Progressing   Problem: Metabolic: Goal: Ability to maintain appropriate glucose levels will improve Outcome: Progressing   Problem: Nutritional: Goal: Maintenance of adequate nutrition will improve Outcome: Progressing Goal: Progress toward achieving an optimal weight will improve Outcome: Progressing   Problem: Skin Integrity: Goal: Risk for impaired skin integrity will decrease Outcome: Progressing   Problem: Tissue Perfusion: Goal: Adequacy of tissue perfusion will improve Outcome: Progressing

## 2023-09-24 NOTE — Assessment & Plan Note (Signed)
 Sepsis resolved

## 2023-09-24 NOTE — Assessment & Plan Note (Signed)
 Thought to be secondary to acute on chronic diastolic congestive heart failure superimposed on likely obesity hypoventilation syndrome and OSA While patient does still exhibit substantial anterior abdominal wall pitting edema and left lower extremity edema most of this edema is dependent edema or lymphedema and patient is approaching intravascular euvolemia. Creatinine down trended slightly today, discussed with cardiology who wishes to increase Lasix  to 80 mg twice daily once again for now.  Weaning oxygen  as able however I believe it is very likely patient will require going home on several liters at all times. Encouraging continued use of BiPAP nightly

## 2023-09-24 NOTE — Progress Notes (Addendum)
 PROGRESS NOTE    [name]  FMW:[id number] DOB: 06/25/1961 DOA: 09/10/2023 PCP: [name], DO   Brief Narrative:  63 y.o. female with medical history significant of morbid obesity, right AKA, irritable bowel syndrome, HTN, HLD, DM, RA, asthma who presented to ED at her PCPs recommendation with worsening cellulitis of right breast.   Of note, patient initially presented to ED on 09/03/2023 and was diagnosed with left lower extremity as well as cellulitis under right breast and was discharged home on cefadroxil .     Due to patient's worsening symptoms of the right breast and left lower extremity patient presented to [facility] emergency department on 12/30.   Upon evaluation in the emergency department patient was found to exhibit multiple SIRS criteria with concerns for developing sepsis.  Patient was initiated on intravenous antibiotics and the hospitalist group was then called to assess the patient for admission to the hospital.   SIRS criteria eventually improved with intravenous antibiotic therapy and patient was switched from intravenous vancomycin  to Rocephin  with completion of 7 days of antibiotics while hospitalized.  Patient was noted to have multiple chronic wounds for which the wound care service assisted in wound care recommendations.   Hospital course was complicated by progressive hypoxia thought to be secondary to increasing volume overload of the course of the hospitalization.  Cardiology consult was obtained and it was felt that patient was likely suffering from acute diastolic congestive heart failure.  He was initiated on intravenous diuretics with some symptomatic improvement.    Furthermore, patient was also found to have intermittent bouts of paroxysmal atrial fibrillation throughout the hospitalization as well with no known history of atrial fibrillation in the past.  Patient was initiated on Eliquis  5 mg twice daily.  Rate control was managed  with Cardizem .      Assessment & Plan:   Principal Problem:   Cellulitis of left lower extremity Active Problems:   Acute on chronic diastolic CHF (congestive heart failure) (HCC)   Type 2 diabetes mellitus with hyperglycemia, with long-term current use of insulin  (HCC)   CAD S/P percutaneous coronary angioplasty   Iron  deficiency anemia   Mixed connective tissue disease (HCC)   Gastroesophageal reflux disease   Morbid obesity (HCC)   Sepsis due to cellulitis (HCC)   Paroxysmal atrial fibrillation (HCC)   Acute on chronic heart failure with preserved ejection fraction (HFpEF) (HCC)   Acute on chronic respiratory failure with hypoxia and hypercapnia (HCC)   Cellulitis of right breast   Open wound of plantar aspect of left foot   Open wound of genital labia   Open abdominal wall wound, initial encounter   Breast wound, left, initial encounter   Hypokalemia   Acute on chronic respiratory failure with hypoxia and hypercapnia (HCC) ruled out.  Seeing patient for the first time and rounding today.  She tells me that she has never required any home oxygen .  Currently she is on 4 L of oxygen  and saturating 90%.  She qualifies for acute hypoxic respiratory failure.  Acute on chronic respiratory failure with hypoxia and hypercapnia as mentioned in previous notes is ruled out.  Thought to be secondary to acute on chronic diastolic congestive heart failure superimposed on likely obesity hypoventilation syndrome and OSA.  Patient still has pitting edema in the abdomen, left lower extremity edema has improved significantly.  Cardiology increased her Lasix  to 80 mg IV twice daily.  They plan to diurese her a little more while in the  hospital.  Plan to wean oxygen  as able to. Encouraging continued use of BiPAP nightly  Acute on chronic diastolic CHF (congestive heart failure) (HCC) Diuresis as above, cardiology managing.  Appreciate their input.  Paroxysmal atrial fibrillation (HCC) Controlled on  diltiazem .  Dose increased to 300 mg daily.  Goes in and out of atrial fibrillation. Eliquis  for anticoagulation Cardiology following, their input is appreciated.  Hypokalemia Labs pending for this morning.   Sepsis secondary to cellulitis of left lower extremity Likely caused by broken down skin in the setting of severe peripheral edema.   Antibiotic course complete.  Sepsis and cellulitis resolved. Compression wraps of the left lower extremity.  Type 2 diabetes mellitus with hyperglycemia, with long-term current use of insulin  (HCC) Patient been placed on Accu-Cheks before every meal and nightly with sliding scale insulin  Earlier in the hospitalization patient was transitioned from U-500 insulin   Patient now receiving  30 units of Semglee  nightly and 10 units of NovoLog  before every meal which has resulted in good glycemic control. Hemoglobin A1C 8.0% Diabetic Diet   CAD S/P percutaneous coronary angioplasty No clinical evidence of ischemia Patient being evaluated for PCSK9 inhibitor as outpatient Discontinued aspirin  as patient is now on Eliquis  for paroxysmal intrafibrillation.  Cellulitis of right breast Antibiotic course complete  Gastroesophageal reflux disease Continue Protonix   Iron  deficiency anemia Hemoglobin stable  Open wound of plantar aspect of left foot Continue daily dressing changes  Open wound of genital labia Continue daily dressing changes  Open abdominal wall wound, initial encounter Continue daily dressing changes  Breast wound, left, initial encounter Continue daily dressing changes   Hypokalemia: Will replenish.  DVT prophylaxis: eliquis    Code Status: Full Code  Family Communication:  None present at bedside.  Plan of care discussed with patient in length and he/she verbalized understanding and agreed with it.  Status is: Inpatient Remains inpatient appropriate because: Needs more IV diuresis per cardiology.   Estimated body mass index  is 52.34 kg/m as calculated from the following:   Height as of this encounter: 5' 2.5 (1.588 m).   Weight as of this encounter: 131.9 kg.  Pressure Injury 12/21/21 Sacrum Mid Stage 2 -  Partial thickness loss of dermis presenting as a shallow open injury with a red, pink wound bed without slough. (Active)  12/21/21 1900  Location: Sacrum  Location Orientation: Mid  Staging: Stage 2 -  Partial thickness loss of dermis presenting as a shallow open injury with a red, pink wound bed without slough.  Wound Description (Comments):   Present on Admission: Yes     Pressure Injury 12/21/21 Sacrum Lower;Mid Stage 2 -  Partial thickness loss of dermis presenting as a shallow open injury with a red, pink wound bed without slough. (Active)  12/21/21 1500  Location: Sacrum  Location Orientation: Lower;Mid  Staging: Stage 2 -  Partial thickness loss of dermis presenting as a shallow open injury with a red, pink wound bed without slough.  Wound Description (Comments):   Present on Admission: Yes   Nutritional Assessment: Body mass index is 52.34 kg/m.SABRA Seen by dietician.  I agree with the assessment and plan as outlined below: Nutrition Status:        . Skin Assessment: I have examined the patient's skin and I agree with the wound assessment as performed by the wound care RN as outlined below: Pressure Injury 12/21/21 Sacrum Mid Stage 2 -  Partial thickness loss of dermis presenting as a shallow open injury with a  red, pink wound bed without slough. (Active)  12/21/21 1900  Location: Sacrum  Location Orientation: Mid  Staging: Stage 2 -  Partial thickness loss of dermis presenting as a shallow open injury with a red, pink wound bed without slough.  Wound Description (Comments):   Present on Admission: Yes     Pressure Injury 12/21/21 Sacrum Lower;Mid Stage 2 -  Partial thickness loss of dermis presenting as a shallow open injury with a red, pink wound bed without slough. (Active)  12/21/21  1500  Location: Sacrum  Location Orientation: Lower;Mid  Staging: Stage 2 -  Partial thickness loss of dermis presenting as a shallow open injury with a red, pink wound bed without slough.  Wound Description (Comments):   Present on Admission: Yes    Consultants:  Cardiology  Procedures:  None  Antimicrobials:  Anti-infectives (From admission, onward)    Start     Dose/Rate Route Frequency Ordered Stop   09/11/23 1800  vancomycin  (VANCOCIN ) 1,750 mg in sodium chloride  0.9 % 500 mL IVPB  Status:  Discontinued        1,750 mg 258.8 mL/hr over 120 Minutes Intravenous Every 24 hours 09/10/23 2052 09/11/23 1309   09/11/23 1400  cefTRIAXone  (ROCEPHIN ) 2 g in sodium chloride  0.9 % 100 mL IVPB        2 g 200 mL/hr over 30 Minutes Intravenous Every 24 hours 09/11/23 1309 09/17/23 1015   09/11/23 0015  fluconazole  (DIFLUCAN ) tablet 100 mg  Status:  Discontinued        100 mg Oral Daily 09/10/23 2005 09/11/23 0948   09/10/23 2000  vancomycin  (VANCOCIN ) IVPB 1000 mg/200 mL premix  Status:  Discontinued        1,000 mg 200 mL/hr over 60 Minutes Intravenous  Once 09/10/23 1958 09/11/23 0005   09/10/23 1415  cefTRIAXone  (ROCEPHIN ) 2 g in sodium chloride  0.9 % 100 mL IVPB        2 g 200 mL/hr over 30 Minutes Intravenous  Once 09/10/23 1406 09/10/23 1518   09/10/23 1415  vancomycin  (VANCOCIN ) IVPB 1000 mg/200 mL premix        1,000 mg 200 mL/hr over 60 Minutes Intravenous Every 1 hr x 2 09/10/23 1408 09/10/23 1923         Subjective: Patient seen and examined.  She has no complaints at all.  Objective: Vitals:   09/24/23 0249 09/24/23 0500 09/24/23 0759 09/24/23 0823  BP: 113/64   131/81  Pulse: 86   86  Resp: 16     Temp: 98.5 F (36.9 C)   98.3 F (36.8 C)  TempSrc: Axillary   Oral  SpO2: 90%  96% 90%  Weight:  131.9 kg    Height:        Intake/Output Summary (Last 24 hours) at 09/24/2023 0947 Last data filed at 09/24/2023 0915 Gross per 24 hour  Intake 480 ml  Output  3950 ml  Net -3470 ml   Filed Weights   09/22/23 0500 09/23/23 0500 09/24/23 0500  Weight: (!) 137.4 kg 133.6 kg 131.9 kg    Examination:  General exam: Appears calm and comfortable  Respiratory system: Clear to auscultation. Respiratory effort normal. Cardiovascular system: S1 & S2 heard, RRR. No JVD, murmurs, rubs, gallops or clicks.  Trace pitting edema left lower extremity.  +2-3 pitting edema in the abdomen. Gastrointestinal system: Abdomen is nondistended, soft and nontender. No organomegaly or masses felt. Normal bowel sounds heard. Central nervous system: Alert and oriented. No focal neurological  deficits. Extremities: Right AKA Skin: No rashes, lesions or ulcers Psychiatry: Judgement and insight appear normal. Mood & affect appropriate.    Data Reviewed: I have personally reviewed following labs and imaging studies  CBC: Recent Labs  Lab 09/18/23 0354 09/19/23 0357 09/20/23 0339 09/22/23 0359  WBC 7.2 6.7 6.8 7.9  NEUTROABS 4.6 4.6 4.6 5.2  HGB 9.0* 9.3* 9.0* 10.1*  HCT 34.9* 37.9 36.4 39.9  MCV 73.6* 76.0* 74.9* 75.3*  PLT 246 280 262 336   Basic Metabolic Panel: Recent Labs  Lab 09/19/23 0357 09/20/23 0339 09/21/23 0352 09/22/23 0359 09/23/23 0424  NA 133* 133* 133* 135 136  K 3.8 3.6 3.3* 3.3* 3.3*  CL 85* 81* 77* 77* 76*  CO2 36* 38* 41* 43* >45*  GLUCOSE 172* 212* 251* 142* 128*  BUN 10 13 18 23  25*  CREATININE 0.78 0.91 1.25* 1.31* 1.21*  CALCIUM  8.4* 8.4* 8.8* 8.6* 8.9  MG  --  2.1 2.1 1.9 2.2   GFR: Estimated Creatinine Clearance: 63.5 mL/min (A) (by C-G formula based on SCr of 1.21 mg/dL (H)). Liver Function Tests: Recent Labs  Lab 09/20/23 0339 09/21/23 0352 09/22/23 0359 09/23/23 0424  AST 38 55* 50* 40  ALT 18 23 23 21   ALKPHOS 74 79 73 75  BILITOT 0.7 1.1 1.2 0.9  PROT 7.1 8.0 7.4 7.6  ALBUMIN 3.1* 3.6 3.3* 3.3*   No results for input(s): LIPASE, AMYLASE in the last 168 hours. No results for input(s): AMMONIA in the  last 168 hours. Coagulation Profile: No results for input(s): INR, PROTIME in the last 168 hours. Cardiac Enzymes: No results for input(s): CKTOTAL, CKMB, CKMBINDEX, TROPONINI in the last 168 hours. BNP (last 3 results) No results for input(s): PROBNP in the last 8760 hours. HbA1C: No results for input(s): HGBA1C in the last 72 hours. CBG: Recent Labs  Lab 09/23/23 1111 09/23/23 1647 09/23/23 2102 09/24/23 0247 09/24/23 0733  GLUCAP 203* 198* 150* 162* 143*   Lipid Profile: No results for input(s): CHOL, HDL, LDLCALC, TRIG, CHOLHDL, LDLDIRECT in the last 72 hours. Thyroid  Function Tests: No results for input(s): TSH, T4TOTAL, FREET4, T3FREE, THYROIDAB in the last 72 hours. Anemia Panel: No results for input(s): VITAMINB12, FOLATE, FERRITIN, TIBC, IRON , RETICCTPCT in the last 72 hours. Sepsis Labs: No results for input(s): PROCALCITON, LATICACIDVEN in the last 168 hours.  No results found for this or any previous visit (from the past 240 hours).   Radiology Studies: No results found.  Scheduled Meds:  apixaban   5 mg Oral BID   arformoterol   15 mcg Nebulization BID   budesonide  (PULMICORT ) nebulizer solution  0.25 mg Nebulization BID   Chlorhexidine  Gluconate Cloth  6 each Topical Daily   diltiazem   300 mg Oral Daily   ferrous sulfate   325 mg Oral BID WC   fluticasone   1 spray Each Nare Daily   furosemide   80 mg Intravenous BID   gabapentin   400 mg Oral BID   gabapentin   800 mg Oral QHS   Gerhardt's butt cream   Topical BID   guaiFENesin   600 mg Oral BID   insulin  aspart  0-20 Units Subcutaneous TID AC & HS   insulin  aspart  10 Units Subcutaneous TID WC   insulin  glargine-yfgn  30 Units Subcutaneous QHS   levothyroxine   200 mcg Oral QAC breakfast   lidocaine   2 patch Transdermal Daily   mupirocin  ointment   Topical BID   nystatin    Topical TID   pantoprazole   40  mg Oral Daily   pramipexole   1 mg Oral TID    pregabalin   50 mg Oral TID   sodium chloride  flush  3 mL Intravenous Q12H   spironolactone   50 mg Oral Daily   Continuous Infusions:   LOS: 14 days   Fredia Skeeter, MD [facility]  09/24/2023, 9:47 AM   *Please note that this is a verbal dictation therefore any spelling or grammatical errors are due to the Dragon [employer] One system interpretation.  Please page via Amion and do not message via secure chat for urgent patient care matters. Secure chat can be used for non urgent patient care matters.  How to contact the [facility] Attending or Consulting provider 7A - 7P or covering provider during after hours 7P -7A, for this patient?  Check the care team in [facility] and look for a) attending/consulting [facility] provider listed and b) the [facility] team listed. Page or secure chat 7A-7P. Log into [email or web address] and use [facility] universal password to access. If you do not have the password, please contact the hospital operator. Locate the [facility] provider you are looking for under [facility] and page to a number that you can be directly reached. If you still have difficulty reaching the provider, please page the [facility] (Director on Call) for the Hospitalists listed on amion for assistance.

## 2023-09-24 NOTE — Assessment & Plan Note (Signed)
 Recurrent  replacing with potassium chloride Monitoring potassium levels with serial chemistries.

## 2023-09-24 NOTE — Assessment & Plan Note (Signed)
Antibiotic course complete

## 2023-09-24 NOTE — Assessment & Plan Note (Signed)
 Controlled on diltiazem. Eliquis for anticoagulation Cardiology following, their input is appreciated.

## 2023-09-24 NOTE — Assessment & Plan Note (Signed)
 Hemoglobin stable Obtaining iron panel

## 2023-09-24 NOTE — Assessment & Plan Note (Signed)
 Patient been placed on Accu-Cheks before every meal and nightly with sliding scale insulin  Earlier in the hospitalization patient was transitioned from U-500 insulin   Patient now receiving  30 units of Semglee  nightly and 10 units of NovoLog  before every meal which has resulted in good glycemic control. Hemoglobin A1C 8.0% Diabetic Diet

## 2023-09-24 NOTE — Assessment & Plan Note (Signed)
 While patient is exhibiting substantial peripheral edema including pitting ventral abdominal wall edema and left lower extremity edema I believe that most of what remains is either dependent edema or lymphedema.  I believe patient is approaching intravascular euvolemia.   Creatinine downtrending slightly today. Daily weights Serial chemistries to monitor renal function and electrolytes

## 2023-09-24 NOTE — Assessment & Plan Note (Signed)
-

## 2023-09-24 NOTE — Progress Notes (Signed)
 PROGRESS NOTE   [name]  FMW:[id number] DOB: 20-Sep-1960 DOA: 09/10/2023 PCP: [name], DO   Date of Service: the patient was seen and examined on 09/24/2023  Brief Narrative:  63 y.o. female with medical history significant of morbid obesity, right AKA, irritable bowel syndrome, HTN, HLD, DM, RA, asthma who presented to ED at her PCPs recommendation with worsening cellulitis of right breast.  Of note, patient initially presented to ED on 09/03/2023 and was diagnosed with left lower extremity as well as cellulitis under right breast and was discharged home on cefadroxil .    Due to patient's worsening symptoms of the right breast and left lower extremity patient presented to [facility] emergency department on 12/30.  Upon evaluation in the emergency department patient was found to exhibit multiple SIRS criteria with concerns for developing sepsis.  Patient was initiated on intravenous antibiotics and the hospitalist group was then called to assess the patient for admission to the hospital.  SIRS criteria eventually improved with intravenous antibiotic therapy and patient was switched from intravenous vancomycin  to Rocephin  with completion of 7 days of antibiotics while hospitalized.  Patient was noted to have multiple chronic wounds for which the wound care service assisted in wound care recommendations.  Hospital course was complicated by progressive hypoxia thought to be secondary to increasing volume overload of the course of the hospitalization.  Cardiology consult was obtained and it was felt that patient was likely suffering from acute diastolic congestive heart failure.  He was initiated on intravenous diuretics with some symptomatic improvement.   Furthermore, patient was also found to have intermittent bouts of paroxysmal atrial fibrillation throughout the hospitalization as well with no known history of atrial fibrillation in the past.  Patient was  initiated on Eliquis  5 mg twice daily.  Rate control was managed with Cardizem .     Assessment & Plan Acute on chronic respiratory failure with hypoxia and hypercapnia (HCC) Thought to be secondary to acute on chronic diastolic congestive heart failure superimposed on likely obesity hypoventilation syndrome and OSA While patient does still exhibit substantial anterior abdominal wall pitting edema and left lower extremity edema most of this edema is dependent edema or lymphedema and patient is approaching intravascular euvolemia. Creatinine down trended slightly today, discussed with cardiology who wishes to increase Lasix  to 80 mg twice daily once again for now.  Weaning oxygen  as able however I believe it is very likely patient will require going home on several liters at all times. Encouraging continued use of BiPAP nightly Acute on chronic diastolic CHF (congestive heart failure) (HCC) While patient is exhibiting substantial peripheral edema including pitting ventral abdominal wall edema and left lower extremity edema I believe that most of what remains is either dependent edema or lymphedema.  I believe patient is approaching intravascular euvolemia.   Creatinine downtrending slightly today. Daily weights Serial chemistries to monitor renal function and electrolytes Paroxysmal atrial fibrillation (HCC) Controlled on diltiazem . Eliquis  for anticoagulation Cardiology following, their input is appreciated. Hypokalemia Recurrent  replacing with potassium chloride  Monitoring potassium levels with serial chemistries.  Cellulitis of left lower extremity Likely caused by broken down skin in the setting of severe peripheral edema.   Antibiotic course complete Compression wraps of the left lower extremity. Type 2 diabetes mellitus with hyperglycemia, with long-term current use of insulin  [facility]) Patient been placed on Accu-Cheks before every meal and nightly with sliding scale insulin  Earlier  in the hospitalization patient was transitioned from U-500 insulin   Patient now  receiving  30 units of Semglee  nightly and 10 units of NovoLog  before every meal which has resulted in good glycemic control. Hemoglobin A1C 8.0% Diabetic Diet  CAD S/P percutaneous coronary angioplasty No clinical evidence of ischemia Patient being evaluated for PCSK9 inhibitor as outpatient Discontinuing aspirin  as patient is now on Eliquis  for paroxysmal intrafibrillation. Cellulitis of right breast Antibiotic course complete Sepsis due to cellulitis (HCC) Sepsis resolved Gastroesophageal reflux disease Continue Protonix  Iron  deficiency anemia Hemoglobin stable Obtaining iron  panel Open wound of plantar aspect of left foot Continue daily dressing changes Open wound of genital labia Continue daily dressing changes Open abdominal wall wound, initial encounter Continue daily dressing changes Breast wound, left, initial encounter Continue daily dressing changes     Subjective:  Patient is complaining of recurrent nausea, mild intensity.  Nausea responded well to Phenergan  yesterday evening.  Patient still complaining of generalized weakness but she reports standing up with physical therapy today. Patient states that her abdominal and left lower extremity swelling are improved.  Physical Exam:  Vitals:   09/23/23 0833 09/23/23 0919 09/23/23 2109 09/23/23 2215  BP: 139/67  114/63   Pulse: 79  85   Resp:  20 20 20   Temp: 98.2 F (36.8 C)  98.1 F (36.7 C)   TempSrc: Oral  Oral   SpO2: 95%  91%   Weight:      Height:         Constitutional: Lethargic but arousable and oriented x3, no associated distress.  Patient is obese. Skin: Dressings of the labia, abdominal wall, breast and left lower extremity are clean dry and intact with section of the dressing of the left lower extremity which is soiled with serosanguineous drainage. Eyes: Pupils are equally reactive to light.  No evidence of  scleral icterus or conjunctival pallor.  ENMT: Moist mucous membranes noted.  Posterior pharynx clear of any exudate or lesions.   Respiratory: clear to auscultation bilaterally, no wheezing, no crackles. Normal respiratory effort. No accessory muscle use.  Cardiovascular: Regular rate and rhythm, no murmurs / rubs / gallops. No extremity edema. 2+ pedal pulses. No carotid bruits.  Abdomen: Protuberant abdomen with pitting edema of the anterior abdominal wall.  Abdomen is firm to palpation.  No evidence of intra-abdominal masses.  Positive bowel sounds noted in all quadrants.   Musculoskeletal: Status post right AKA.  Improved pitting edema of the left lower extremity.  Good ROM, no contractures. Normal muscle tone.    Data Reviewed:  I have personally reviewed and interpreted labs, imaging.  Significant findings are   CBC: Recent Labs  Lab 09/18/23 0354 09/19/23 0357 09/20/23 0339 09/22/23 0359  WBC 7.2 6.7 6.8 7.9  NEUTROABS 4.6 4.6 4.6 5.2  HGB 9.0* 9.3* 9.0* 10.1*  HCT 34.9* 37.9 36.4 39.9  MCV 73.6* 76.0* 74.9* 75.3*  PLT 246 280 262 336   Basic Metabolic Panel: Recent Labs  Lab 09/19/23 0357 09/20/23 0339 09/21/23 0352 09/22/23 0359 09/23/23 0424  NA 133* 133* 133* 135 136  K 3.8 3.6 3.3* 3.3* 3.3*  CL 85* 81* 77* 77* 76*  CO2 36* 38* 41* 43* >45*  GLUCOSE 172* 212* 251* 142* 128*  BUN 10 13 18 23  25*  CREATININE 0.78 0.91 1.25* 1.31* 1.21*  CALCIUM  8.4* 8.4* 8.8* 8.6* 8.9  MG  --  2.1 2.1 1.9 2.2     Code Status:  Full code.  Code status decision has been confirmed with: patient Family Communication: Plan of care discussed with husband  at the bedside.   Severity of Illness:  The appropriate patient status for this patient is INPATIENT. Inpatient status is judged to be reasonable and necessary in order to provide the required intensity of service to ensure the patient's safety. The patient's presenting symptoms, physical exam findings, and initial  radiographic and laboratory data in the context of their chronic comorbidities is felt to place them at high risk for further clinical deterioration. Furthermore, it is not anticipated that the patient will be medically stable for discharge from the hospital within 2 midnights of admission.   * I certify that at the point of admission it is my clinical judgment that the patient will require inpatient hospital care spanning beyond 2 midnights from the point of admission due to high intensity of service, high risk for further deterioration and high frequency of surveillance required.*  Time spent:  336 minutes  Author:  Zachary JINNY Ba MD

## 2023-09-25 DIAGNOSIS — L03116 Cellulitis of left lower limb: Secondary | ICD-10-CM | POA: Diagnosis not present

## 2023-09-25 LAB — CBC
HCT: 41.9 % (ref 36.0–46.0)
Hemoglobin: 10.7 g/dL — ABNORMAL LOW (ref 12.0–15.0)
MCH: 19.9 pg — ABNORMAL LOW (ref 26.0–34.0)
MCHC: 25.5 g/dL — ABNORMAL LOW (ref 30.0–36.0)
MCV: 77.7 fL — ABNORMAL LOW (ref 80.0–100.0)
Platelets: 376 10*3/uL (ref 150–400)
RBC: 5.39 MIL/uL — ABNORMAL HIGH (ref 3.87–5.11)
RDW: 32.3 % — ABNORMAL HIGH (ref 11.5–15.5)
WBC: 7.9 10*3/uL (ref 4.0–10.5)
nRBC: 0 % (ref 0.0–0.2)

## 2023-09-25 LAB — BASIC METABOLIC PANEL
Anion gap: 13 (ref 5–15)
BUN: 22 mg/dL (ref 8–23)
CO2: 43 mmol/L — ABNORMAL HIGH (ref 22–32)
Calcium: 8.9 mg/dL (ref 8.9–10.3)
Chloride: 78 mmol/L — ABNORMAL LOW (ref 98–111)
Creatinine, Ser: 1.2 mg/dL — ABNORMAL HIGH (ref 0.44–1.00)
GFR, Estimated: 51 mL/min — ABNORMAL LOW (ref >60)
Glucose, Bld: 141 mg/dL — ABNORMAL HIGH (ref 70–99)
Potassium: 3.2 mmol/L — ABNORMAL LOW (ref 3.5–5.1)
Sodium: 134 mmol/L — ABNORMAL LOW (ref 135–145)

## 2023-09-25 LAB — GLUCOSE, CAPILLARY
Glucose-Capillary: 155 mg/dL — ABNORMAL HIGH (ref 70–99)
Glucose-Capillary: 162 mg/dL — ABNORMAL HIGH (ref 70–99)
Glucose-Capillary: 326 mg/dL — ABNORMAL HIGH (ref 70–99)
Glucose-Capillary: 114 mg/dL — ABNORMAL HIGH (ref 70–99)
Glucose-Capillary: 255 mg/dL — ABNORMAL HIGH (ref 70–99)

## 2023-09-25 LAB — PHOSPHORUS: Phosphorus: 4.3 mg/dL (ref 2.5–4.6)

## 2023-09-25 MED ORDER — SODIUM CHLORIDE 0.9% FLUSH
3.0000 mL | Freq: Two times a day (BID) | INTRAVENOUS | Status: DC
  Administered 2023-09-25 – 2023-09-30 (×9): 3 mL via INTRAVENOUS

## 2023-09-25 MED ORDER — HYDRALAZINE HCL 20 MG/ML IJ SOLN: 10.0000 mg | INTRAMUSCULAR | Status: DC | PRN

## 2023-09-25 MED ORDER — POTASSIUM CHLORIDE CRYS ER 20 MEQ PO TBCR
40.0000 meq | EXTENDED_RELEASE_TABLET | ORAL | Status: AC
  Administered 2023-09-25 (×3): 40 meq via ORAL
  Filled 2023-09-25 (×2): qty 2
  Filled 2023-09-25: qty 4

## 2023-09-25 MED ORDER — METOPROLOL TARTRATE 5 MG/5ML IV SOLN: 5.0000 mg | INTRAVENOUS | Status: DC | PRN

## 2023-09-25 MED ORDER — ONDANSETRON HCL 4 MG/2ML IJ SOLN: 4.0000 mg | Freq: Four times a day (QID) | INTRAMUSCULAR | Status: DC | PRN

## 2023-09-25 MED ORDER — GLUCAGON HCL RDNA (DIAGNOSTIC) 1 MG IJ SOLR: 1.0000 mg | INTRAMUSCULAR | Status: DC | PRN

## 2023-09-25 MED ORDER — SENNOSIDES-DOCUSATE SODIUM 8.6-50 MG PO TABS: 1.0000 | ORAL_TABLET | Freq: Every evening | ORAL | Status: DC | PRN

## 2023-09-25 MED ORDER — TRAZODONE HCL 50 MG PO TABS: 50.0000 mg | ORAL_TABLET | Freq: Every evening | ORAL | Status: DC | PRN

## 2023-09-25 NOTE — Progress Notes (Addendum)
 Progress Note  Patient Name: [name] Date of Encounter: 09/25/2023  Primary Cardiologist: Wilbert Bihari, MD  Subjective   Net negative another 1.3L - overall almost 30L Negative.   Inpatient Medications    Scheduled Meds:  apixaban   5 mg Oral BID   arformoterol   15 mcg Nebulization BID   budesonide  (PULMICORT ) nebulizer solution  0.25 mg Nebulization BID   Chlorhexidine  Gluconate Cloth  6 each Topical Daily   diltiazem   300 mg Oral Daily   ferrous sulfate   325 mg Oral BID WC   fluticasone   1 spray Each Nare Daily   furosemide   80 mg Intravenous BID   gabapentin   400 mg Oral BID   gabapentin   800 mg Oral QHS   Gerhardt's butt cream   Topical BID   guaiFENesin   600 mg Oral BID   insulin  aspart  0-20 Units Subcutaneous TID AC & HS   insulin  aspart  10 Units Subcutaneous TID WC   insulin  glargine-yfgn  30 Units Subcutaneous QHS   levothyroxine   200 mcg Oral QAC breakfast   lidocaine   2 patch Transdermal Daily   mupirocin  ointment   Topical BID   nystatin    Topical TID   pantoprazole   40 mg Oral Daily   potassium chloride   40 mEq Oral Q4H   pramipexole   1 mg Oral TID   pregabalin   50 mg Oral TID   sodium chloride  flush  3 mL Intravenous Q12H   spironolactone   50 mg Oral Daily   Continuous Infusions:  PRN Meds: acetaminophen  **OR** acetaminophen , albuterol , glucagon  (human recombinant), hydrALAZINE , metoprolol  tartrate, ondansetron  (ZOFRAN ) IV, oxyCODONE , promethazine , senna-docusate, sodium chloride , traZODone    Vital Signs    Vitals:   09/25/23 0515 09/25/23 0833 09/25/23 0836 09/25/23 1157  BP: 123/69   (!) 140/64  Pulse: 74   87  Resp: 18   20  Temp: 97.7 F (36.5 C)   97.8 F (36.6 C)  TempSrc: Oral   Oral  SpO2: 92% 92% 92% 94%  Weight:      Height:        Intake/Output Summary (Last 24 hours) at 09/25/2023 1500 Last data filed at 09/25/2023 0830 Gross per 24 hour  Intake 720 ml  Output 1450 ml  Net -730 ml      09/25/2023    5:00 AM 09/24/2023     5:00 AM 09/23/2023    5:00 AM  Last 3 Weights  Weight (lbs) 268 lb 8.3 oz 290 lb 12.6 oz 294 lb 8.6 oz  Weight (kg) 121.8 kg 131.9 kg 133.6 kg     Telemetry    Intermittent bouts of AF interspersed with NSR - difficult P wave amplitude - Personally Reviewed  Physical Exam   GEN: No acute distress.  HEENT: Normocephalic, atraumatic, sclera non-icteric. Neck: No JVD or bruits. Cardiac: RRR no murmurs, rubs, or gallops.  Respiratory: Decreased BS bilaterally. Breathing is unlabored. GI: Soft, rounded obese pannus, BS +x 4. MS: no deformity. Extremities: No clubbing or cyanosis. S/p R AKA. LLE is erythematous and wrapped with edema Neuro:  AAOx3. Follows commands. Psych:  Responds to questions appropriately with a normal affect.  Labs    High Sensitivity Troponin:   Recent Labs  Lab 09/10/23 1434  TROPONINIHS 17      Cardiac EnzymesNo results for input(s): TROPONINI in the last 168 hours. No results for input(s): TROPIPOC in the last 168 hours.   Chemistry Recent Labs  Lab 09/21/23 [phone] 09/22/23 0359 09/23/23 0424 09/24/23  1031 09/25/23 0347  NA 133* 135 136 131* 134*  K 3.3* 3.3* 3.3* 3.3* 3.2*  CL 77* 77* 76* 74* 78*  CO2 41* 43* >45* 42* 43*  GLUCOSE 251* 142* 128* 182* 141*  BUN 18 23 25* 22 22  CREATININE 1.25* 1.31* 1.21* 1.15* 1.20*  CALCIUM  8.8* 8.6* 8.9 8.7* 8.9  PROT 8.0 7.4 7.6  --   --   ALBUMIN 3.6 3.3* 3.3*  --   --   AST 55* 50* 40  --   --   ALT 23 23 21   --   --   ALKPHOS 79 73 75  --   --   BILITOT 1.1 1.2 0.9  --   --   GFRNONAA 49* 46* 51* 54* 51*  ANIONGAP 15 15 NOT CALCULATED 15 13     Hematology Recent Labs  Lab 09/22/23 0359 09/24/23 1031 09/25/23 0347  WBC 7.9 8.3 7.9  RBC 5.30* 5.69* 5.39*  HGB 10.1* 11.4* 10.7*  HCT 39.9 43.8 41.9  MCV 75.3* 77.0* 77.7*  MCH 19.1* 20.0* 19.9*  MCHC 25.3* 26.0* 25.5*  RDW 30.6* 32.1* 32.3*  PLT 336 384 376    BNPNo results for input(s): BNP, PROBNP in the last 168 hours.    DDimer No results for input(s): DDIMER in the last 168 hours.   Radiology    No results found.  Cardiac Studies   2d echo 09/12/23   1. Left ventricular ejection fraction, by estimation, is 60 to 65%. The  left ventricle has normal function. Left ventricular endocardial border  not optimally defined to evaluate regional wall motion. Left ventricular  diastolic parameters are  indeterminate.   2. RV not well visualized, grossly appears normal in size and function. .  Right ventricular systolic function was not well visualized. The right  ventricular size is not well visualized. Tricuspid regurgitation signal is  inadequate for assessing PA  pressure.   3. The mitral valve was not well visualized. No evidence of mitral valve  regurgitation. No evidence of mitral stenosis.   4. The aortic valve was not well visualized. Aortic valve regurgitation  is not visualized. No aortic stenosis is present.   5. Technically difficult study, poor visualization.    Patient Profile   63 y.o. female with CAD s/p DES to RCA 2017 with repeat DES to ostial RCA 2019, PAF in setting of sepsis/bacteremia, intolerance of BB in the past due to wheezing, uncontrolled DM, morbid obesity (prior suspected OSA/OHS), HTN, right AKA, hyperkalemia with ARB, medication noncompliance, chronic HFpEF admitted with cellulitis, acute on chronic HFpEF and hypoxia.   Assessment & Plan   1. Acute on chronic diastolic heart failure - has been diuresed with IV lasix  (increased to 80mg  BID on 1/12) and spironolactone  50mg , -26.5L, weight 347->290lb - echo this admission with preserved LVEF, not clear images of RV, but normal RV function on TEE in 2022 - may ultimately need a RHC to determine if pulmonary hypertension contirbuting and heart pressures - persistent hypokalemia - replete  - sodium improved today- continue diuresis. - We have arranged for RHC tomorrow at [facility]    2. Persistent hypoxia - continues to require  O2 despite IV diuresis, may be chronic need at this point - echo this admission as above - volume status difficult given body habitus - suspect a component of OSA and OHS, she has not tolerated CPAP, likely a component of PH, will need eval for home O2 at DC   3. PAF -  in and out this admission per notes - anticoagulation initiated - diltiazem  titrated to 300mg  daily, seems to be holding NSR today, rates controlled (p wave amplitude challenging on telemetry) - avoid BB due to wheezing with this in the past - reports recent palpitations - management of recent abnormal TSH per primary team   4. CAD - prior PCI/DES to distal RCA and ostial RCA, last ischemic evaluation  - she denies chest pain - ASA discontinued this admission given concomitant Eliquis  - does not appear she followed up for PCSK9i, she preferred to avoid statins, can re-engage as outpatient - had MSK chest pain this admission, not felt to have ACS, improved   5. Microcytic anemia - per primary team  Informed Consent   Shared Decision Making/Informed Consent The risks [stroke (1 in 1000), death (1 in 1000), kidney failure [usually temporary] (1 in 500), bleeding (1 in 200), allergic reaction [possibly serious] (1 in 200)], benefits (diagnostic support and management of coronary artery disease) and alternatives of a cardiac catheterization were discussed in detail with Ms. Hizer and she is willing to proceed.    For questions or updates, please contact [facility] Please consult [email or web address] for contact info under Cardiology/STEMI.  Vinie KYM Maxcy, MD, [facility], FACP    [facility]  Medical Director of the [facility]id [facility]  Cardiovascular Risk Reduction Clinic Diplomate of the American Board of Clinical Lipidology Attending Cardiologist  Direct Dial: [phone]  Fax: [phone]  Website:  [email or web address]  Vinie JAYSON Maxcy, MD 09/25/2023, 3:00 PM

## 2023-09-25 NOTE — Progress Notes (Signed)
   09/25/23 2301  BiPAP/CPAP/SIPAP  BiPAP/CPAP/SIPAP Pt Type Adult  BiPAP/CPAP/SIPAP DREAMSTATIOND  Mask Type Full face mask  Mask Size Medium  Respiratory Rate 16 breaths/min  Flow Rate 5 lpm  Patient Home Equipment No  Auto Titrate Yes (autobipap, Imax:20cm, Emin:5cm, PS5-8)  CPAP/SIPAP surface wiped down Yes  BiPAP/CPAP /SiPAP Vitals  Pulse Rate 78  Resp 16  SpO2 93 %

## 2023-09-25 NOTE — Plan of Care (Signed)
  Problem: Fluid Volume: Goal: Hemodynamic stability will improve Outcome: Progressing   Problem: Clinical Measurements: Goal: Diagnostic test results will improve Outcome: Progressing Goal: Signs and symptoms of infection will decrease Outcome: Progressing   Problem: Respiratory: Goal: Ability to maintain adequate ventilation will improve Outcome: Progressing   Problem: Health Behavior/Discharge Planning: Goal: Ability to manage health-related needs will improve Outcome: Progressing   Problem: Clinical Measurements: Goal: Ability to maintain clinical measurements within normal limits will improve Outcome: Progressing Goal: Will remain free from infection Outcome: Progressing Goal: Diagnostic test results will improve Outcome: Progressing Goal: Respiratory complications will improve Outcome: Progressing Goal: Cardiovascular complication will be avoided Outcome: Progressing   Problem: Activity: Goal: Risk for activity intolerance will decrease Outcome: Progressing   Problem: Nutrition: Goal: Adequate nutrition will be maintained Outcome: Progressing   Problem: Coping: Goal: Level of anxiety will decrease Outcome: Progressing   Problem: Elimination: Goal: Will not experience complications related to bowel motility Outcome: Progressing Goal: Will not experience complications related to urinary retention Outcome: Progressing   Problem: Pain Management: Goal: General experience of comfort will improve Outcome: Progressing   Problem: Safety: Goal: Ability to remain free from injury will improve Outcome: Progressing   Problem: Skin Integrity: Goal: Risk for impaired skin integrity will decrease Outcome: Progressing   Problem: Education: Goal: Ability to describe self-care measures that may prevent or decrease complications (Diabetes Survival Skills Education) will improve Outcome: Progressing Goal: Individualized Educational Video(s) Outcome: Progressing    Problem: Coping: Goal: Ability to adjust to condition or change in health will improve Outcome: Progressing   Problem: Fluid Volume: Goal: Ability to maintain a balanced intake and output will improve Outcome: Progressing   Problem: Health Behavior/Discharge Planning: Goal: Ability to identify and utilize available resources and services will improve Outcome: Progressing Goal: Ability to manage health-related needs will improve Outcome: Progressing   Problem: Metabolic: Goal: Ability to maintain appropriate glucose levels will improve Outcome: Progressing   Problem: Nutritional: Goal: Maintenance of adequate nutrition will improve Outcome: Progressing Goal: Progress toward achieving an optimal weight will improve Outcome: Progressing   Problem: Skin Integrity: Goal: Risk for impaired skin integrity will decrease Outcome: Progressing   Problem: Tissue Perfusion: Goal: Adequacy of tissue perfusion will improve Outcome: Progressing

## 2023-09-25 NOTE — Progress Notes (Signed)
 Physical Therapy Treatment Patient Details Name: [name] MRN: [id number] DOB: [birth year] Today's Date: 09/25/2023   History of Present Illness [name] is a 62 y.o.presented 09/10/23 from PCP for worsening Right breast cellulitis, LLE cellulitis. PMH: morbid obesity, right AKA, irritable bowel syndrome, hypertension, hyperlipidemia, hypothyroidism, type 2 diabetes mellitus, rheumatoid arthritis, asthma    PT Comments  The patient is very alert and motivated to mobilize.  Patient stood at  [facility] (bed not raise) x 4, 10,11. Patient motivated and hoping to return home if able to  transfer to [facility] but may benefit from SNF/rehab. Patient off O2 x ~  5  to get hair washed. Noted SPO2 dropped to 75%. Replaced  on 5 L and returned to 93%.    If plan is discharge home, recommend the following: Two people to help with walking and/or transfers;Two people to help with bathing/dressing/bathroom;Assist for transportation;Help with stairs or ramp for entrance;A lot of help with bathing/dressing/bathroom   Can travel by private vehicle     No  Equipment Recommendations       Recommendations for Other Services       Precautions / Restrictions Precautions Precautions: Fall Precaution Comments: R AKA, right shoulder pain ? RTC injury/, desats Restrictions Other Position/Activity Restrictions: RT AKA with sore so has been unable to use prosthesis     Mobility  Bed Mobility         Supine to sit: Supervision, HOB elevated, Used rails     General bed mobility comments: min assist wih LLE back onto bed.    Transfers Overall transfer level: Needs assistance Equipment used: Rolling walker (2 wheels) Transfers: Sit to/from Stand Sit to Stand: +2 physical assistance, +2 safety/equipment, Mod assist           General transfer comment: stood from bed(not raised) x 1 x 4   then x10 then 11    Ambulation/Gait                   Stairs             Wheelchair  Mobility     Tilt Bed    Modified Rankin (Stroke Patients Only)       Balance Overall balance assessment: Needs assistance, History of Falls Sitting-balance support: Single extremity supported, Feet supported Sitting balance-[name]: Good Sitting balance - Comments: able to sit, tends to prop to the right Postural control: Right lateral lean Standing balance support: Bilateral upper extremity supported, During functional activity, Reliant on assistive device for balance Standing balance-[name]: Poor Standing balance comment: reliant on RW                            Cognition Arousal: Alert Behavior During Therapy: WFL for tasks assessed/performed Overall Cognitive Status: Within Functional Limits for tasks assessed                                          Exercises      General Comments        Pertinent Vitals/Pain Pain Assessment Faces Pain Scale: Hurts little more Pain Location: right shoulder with RW WB, Pain Descriptors / Indicators: Grimacing, Guarding Pain Intervention(s): Monitored during session    Home Living  Prior Function            PT Goals (current goals can now be found in the care plan section) Acute Rehab PT Goals PT Goal Formulation: With patient/family Time For Goal Achievement: 10/09/23 Potential to Achieve Goals: Fair Progress towards PT goals: Progressing toward goals    Frequency    Min 1X/week      PT Plan      Co-evaluation              AM-PAC PT 6 Clicks Mobility   Outcome Measure  Help needed turning from your back to your side while in a flat bed without using bedrails?: A Little Help needed moving from lying on your back to sitting on the side of a flat bed without using bedrails?: A Little Help needed moving to and from a bed to a chair (including a wheelchair)?: Total Help needed standing up from a chair using your arms (e.g.,  wheelchair or bedside chair)?: Total Help needed to walk in hospital room?: Total Help needed climbing 3-5 steps with a railing? : Total 6 Click Score: 10    End of Session Equipment Utilized During Treatment: Gait belt Activity Tolerance: Patient tolerated treatment well Patient left: in bed;with call bell/phone within reach;with bed alarm set;with nursing/sitter in room Nurse Communication: Mobility status;Need for lift equipment PT Visit Diagnosis: Unsteadiness on feet (R26.81);Muscle weakness (generalized) (M62.81);Difficulty in walking, not elsewhere classified (R26.2);Pain Pain - Right/Left: Right Pain - part of body: Shoulder     Time: 8979-8885 PT Time Calculation (min) (ACUTE ONLY): 54 min  Charges:    $Therapeutic Activity: 53-67 mins PT General Charges $$ ACUTE PT VISIT: 1 Visit                     Darice Potters PT Acute Rehabilitation Services Office [phone] Weekend [phone]    Potters Darice Norris 09/25/2023, 2:04 PM

## 2023-09-25 NOTE — Plan of Care (Signed)
  Problem: Fluid Volume: Goal: Hemodynamic stability will improve Outcome: Progressing   Problem: Clinical Measurements: Goal: Cardiovascular complication will be avoided Outcome: Progressing   Problem: Activity: Goal: Risk for activity intolerance will decrease Outcome: Progressing   Problem: Nutrition: Goal: Adequate nutrition will be maintained Outcome: Progressing   Problem: Coping: Goal: Level of anxiety will decrease Outcome: Progressing   Problem: Pain Management: Goal: General experience of comfort will improve Outcome: Progressing   Problem: Safety: Goal: Ability to remain free from injury will improve Outcome: Progressing

## 2023-09-25 NOTE — Inpatient Diabetes Management (Signed)
 Inpatient Diabetes Program Recommendations  AACE/ADA: New Consensus Statement on Inpatient Glycemic Control (2015)  Target Ranges:  Prepandial:   less than 140 mg/dL      Peak postprandial:   less than 180 mg/dL (1-2 hours)      Critically ill patients:  140 - 180 mg/dL   Lab Results  Component Value Date   GLUCAP 162 (H) 09/25/2023   HGBA1C 8.0 (H) 09/10/2023    Review of Glycemic Control  Diabetes history: DM2 Outpatient Diabetes medications: U-500 5 units TID, SSI 3-10 units TID, metformin  1000 mg BID, Has Dexcom CGM Current orders for Inpatient glycemic control: Semglee  30 at bedtime, Novolog  0-20 TID with meals and 0-5 HS + 10 units TID with meals  HgbA1C 8.0%  Inpatient Diabetes Program Recommendations:    Increase Novolog  to 14 units TID  Continue to follow.  Thank you. Shona Brandy, RD, LDN, CDCES Inpatient Diabetes Coordinator [phone]

## 2023-09-25 NOTE — H&P (View-Only) (Signed)
 Progress Note  Patient Name: [name] Date of Encounter: 09/25/2023  Primary Cardiologist: Wilbert Bihari, MD  Subjective   Net negative another 1.3L - overall almost 30L Negative.   Inpatient Medications    Scheduled Meds:  apixaban   5 mg Oral BID   arformoterol   15 mcg Nebulization BID   budesonide  (PULMICORT ) nebulizer solution  0.25 mg Nebulization BID   Chlorhexidine  Gluconate Cloth  6 each Topical Daily   diltiazem   300 mg Oral Daily   ferrous sulfate   325 mg Oral BID WC   fluticasone   1 spray Each Nare Daily   furosemide   80 mg Intravenous BID   gabapentin   400 mg Oral BID   gabapentin   800 mg Oral QHS   Gerhardt's butt cream   Topical BID   guaiFENesin   600 mg Oral BID   insulin  aspart  0-20 Units Subcutaneous TID AC & HS   insulin  aspart  10 Units Subcutaneous TID WC   insulin  glargine-yfgn  30 Units Subcutaneous QHS   levothyroxine   200 mcg Oral QAC breakfast   lidocaine   2 patch Transdermal Daily   mupirocin  ointment   Topical BID   nystatin    Topical TID   pantoprazole   40 mg Oral Daily   potassium chloride   40 mEq Oral Q4H   pramipexole   1 mg Oral TID   pregabalin   50 mg Oral TID   sodium chloride  flush  3 mL Intravenous Q12H   spironolactone   50 mg Oral Daily   Continuous Infusions:  PRN Meds: acetaminophen  **OR** acetaminophen , albuterol , glucagon  (human recombinant), hydrALAZINE , metoprolol  tartrate, ondansetron  (ZOFRAN ) IV, oxyCODONE , promethazine , senna-docusate, sodium chloride , traZODone    Vital Signs    Vitals:   09/25/23 0515 09/25/23 0833 09/25/23 0836 09/25/23 1157  BP: 123/69   (!) 140/64  Pulse: 74   87  Resp: 18   20  Temp: 97.7 F (36.5 C)   97.8 F (36.6 C)  TempSrc: Oral   Oral  SpO2: 92% 92% 92% 94%  Weight:      Height:        Intake/Output Summary (Last 24 hours) at 09/25/2023 1500 Last data filed at 09/25/2023 0830 Gross per 24 hour  Intake 720 ml  Output 1450 ml  Net -730 ml      09/25/2023    5:00 AM 09/24/2023     5:00 AM 09/23/2023    5:00 AM  Last 3 Weights  Weight (lbs) 268 lb 8.3 oz 290 lb 12.6 oz 294 lb 8.6 oz  Weight (kg) 121.8 kg 131.9 kg 133.6 kg     Telemetry    Intermittent bouts of AF interspersed with NSR - difficult P wave amplitude - Personally Reviewed  Physical Exam   GEN: No acute distress.  HEENT: Normocephalic, atraumatic, sclera non-icteric. Neck: No JVD or bruits. Cardiac: RRR no murmurs, rubs, or gallops.  Respiratory: Decreased BS bilaterally. Breathing is unlabored. GI: Soft, rounded obese pannus, BS +x 4. MS: no deformity. Extremities: No clubbing or cyanosis. S/p R AKA. LLE is erythematous and wrapped with edema Neuro:  AAOx3. Follows commands. Psych:  Responds to questions appropriately with a normal affect.  Labs    High Sensitivity Troponin:   Recent Labs  Lab 09/10/23 1434  TROPONINIHS 17      Cardiac EnzymesNo results for input(s): TROPONINI in the last 168 hours. No results for input(s): TROPIPOC in the last 168 hours.   Chemistry Recent Labs  Lab 09/21/23 [phone] 09/22/23 0359 09/23/23 0424 09/24/23  1031 09/25/23 0347  NA 133* 135 136 131* 134*  K 3.3* 3.3* 3.3* 3.3* 3.2*  CL 77* 77* 76* 74* 78*  CO2 41* 43* >45* 42* 43*  GLUCOSE 251* 142* 128* 182* 141*  BUN 18 23 25* 22 22  CREATININE 1.25* 1.31* 1.21* 1.15* 1.20*  CALCIUM  8.8* 8.6* 8.9 8.7* 8.9  PROT 8.0 7.4 7.6  --   --   ALBUMIN 3.6 3.3* 3.3*  --   --   AST 55* 50* 40  --   --   ALT 23 23 21   --   --   ALKPHOS 79 73 75  --   --   BILITOT 1.1 1.2 0.9  --   --   GFRNONAA 49* 46* 51* 54* 51*  ANIONGAP 15 15 NOT CALCULATED 15 13     Hematology Recent Labs  Lab 09/22/23 0359 09/24/23 1031 09/25/23 0347  WBC 7.9 8.3 7.9  RBC 5.30* 5.69* 5.39*  HGB 10.1* 11.4* 10.7*  HCT 39.9 43.8 41.9  MCV 75.3* 77.0* 77.7*  MCH 19.1* 20.0* 19.9*  MCHC 25.3* 26.0* 25.5*  RDW 30.6* 32.1* 32.3*  PLT 336 384 376    BNPNo results for input(s): BNP, PROBNP in the last 168 hours.    DDimer No results for input(s): DDIMER in the last 168 hours.   Radiology    No results found.  Cardiac Studies   2d echo 09/12/23   1. Left ventricular ejection fraction, by estimation, is 60 to 65%. The  left ventricle has normal function. Left ventricular endocardial border  not optimally defined to evaluate regional wall motion. Left ventricular  diastolic parameters are  indeterminate.   2. RV not well visualized, grossly appears normal in size and function. .  Right ventricular systolic function was not well visualized. The right  ventricular size is not well visualized. Tricuspid regurgitation signal is  inadequate for assessing PA  pressure.   3. The mitral valve was not well visualized. No evidence of mitral valve  regurgitation. No evidence of mitral stenosis.   4. The aortic valve was not well visualized. Aortic valve regurgitation  is not visualized. No aortic stenosis is present.   5. Technically difficult study, poor visualization.    Patient Profile   63 y.o. female with CAD s/p DES to RCA 2017 with repeat DES to ostial RCA 2019, PAF in setting of sepsis/bacteremia, intolerance of BB in the past due to wheezing, uncontrolled DM, morbid obesity (prior suspected OSA/OHS), HTN, right AKA, hyperkalemia with ARB, medication noncompliance, chronic HFpEF admitted with cellulitis, acute on chronic HFpEF and hypoxia.   Assessment & Plan   1. Acute on chronic diastolic heart failure - has been diuresed with IV lasix  (increased to 80mg  BID on 1/12) and spironolactone  50mg , -26.5L, weight 347->290lb - echo this admission with preserved LVEF, not clear images of RV, but normal RV function on TEE in 2022 - may ultimately need a RHC to determine if pulmonary hypertension contirbuting and heart pressures - persistent hypokalemia - replete  - sodium improved today- continue diuresis. - We have arranged for RHC tomorrow at [facility]    2. Persistent hypoxia - continues to require  O2 despite IV diuresis, may be chronic need at this point - echo this admission as above - volume status difficult given body habitus - suspect a component of OSA and OHS, she has not tolerated CPAP, likely a component of PH, will need eval for home O2 at DC   3. PAF -  in and out this admission per notes - anticoagulation initiated - diltiazem  titrated to 300mg  daily, seems to be holding NSR today, rates controlled (p wave amplitude challenging on telemetry) - avoid BB due to wheezing with this in the past - reports recent palpitations - management of recent abnormal TSH per primary team   4. CAD - prior PCI/DES to distal RCA and ostial RCA, last ischemic evaluation  - she denies chest pain - ASA discontinued this admission given concomitant Eliquis  - does not appear she followed up for PCSK9i, she preferred to avoid statins, can re-engage as outpatient - had MSK chest pain this admission, not felt to have ACS, improved   5. Microcytic anemia - per primary team  Informed Consent   Shared Decision Making/Informed Consent The risks [stroke (1 in 1000), death (1 in 1000), kidney failure [usually temporary] (1 in 500), bleeding (1 in 200), allergic reaction [possibly serious] (1 in 200)], benefits (diagnostic support and management of coronary artery disease) and alternatives of a cardiac catheterization were discussed in detail with [name] and she is willing to proceed.    For questions or updates, please contact [facility] Please consult [email or web address] for contact info under Cardiology/STEMI.  Vinie KYM Maxcy, MD, [facility], FACP    [facility]  Medical Director of the [facility]  [facility] Diplomate of the American Board of Clinical Lipidology Attending Cardiologist  Direct Dial: [phone]  Fax: [phone]  Website:  [email or web address]  Vinie JAYSON Maxcy, MD 09/25/2023, 3:00 PM

## 2023-09-25 NOTE — Progress Notes (Signed)
 PROGRESS NOTE    [name]  FMW:[id number] DOB: 12-10-1960 DOA: 09/10/2023 PCP: [name], DO    Brief Narrative:  63 y.o. female with medical history significant of morbid obesity, right AKA, irritable bowel syndrome, HTN, HLD, DM, RA, asthma who presented to ED at her PCPs recommendation with worsening cellulitis of right breast.   Of note, patient initially presented to ED on 09/03/2023 and was diagnosed with left lower extremity as well as cellulitis under right breast and was discharged home on cefadroxil .     Due to patient's worsening symptoms of the right breast and left lower extremity patient presented to [facility] emergency department on 12/30.   Upon this admission found to have sepsis secondary to right breast cellulitis completed 7 days of IV Rocephin  on 1/6.   Hospital course was complicated by progressive hypoxia secondary to diastolic CHF, getting IV diuretics per cardiology.  Furthermore A-fib is being managed with help of cardiology team as well.   Assessment & Plan:  Principal Problem:   Cellulitis of left lower extremity Active Problems:   Acute on chronic diastolic CHF (congestive heart failure) (HCC)   Type 2 diabetes mellitus with hyperglycemia, with long-term current use of insulin  (HCC)   CAD S/P percutaneous coronary angioplasty   Iron  deficiency anemia   Mixed connective tissue disease (HCC)   Gastroesophageal reflux disease   Morbid obesity (HCC)   Sepsis due to cellulitis (HCC)   Paroxysmal atrial fibrillation (HCC)   Acute on chronic heart failure with preserved ejection fraction (HFpEF) (HCC)   Acute on chronic respiratory failure with hypoxia and hypercapnia (HCC)   Cellulitis of right breast   Open wound of plantar aspect of left foot   Open wound of genital labia   Open abdominal wall wound, initial encounter   Breast wound, left, initial encounter   Hypokalemia    Acute on chronic respiratory failure with  hypoxia and hypercapnia (HCC) ruled out.  Seeing patient for the first time and rounding today.  She tells me that she has never required any home oxygen .  Currently she is on 4 L of oxygen  and saturating 90%.  She qualifies for acute hypoxic respiratory failure.  Acute on chronic respiratory failure with hypoxia and hypercapnia as mentioned in previous notes is ruled out.  -Combination of diastolic CHF, obesity hypoventilation syndrome/OSA.  Currently getting IV diuretics per cardiology team.  Encouraging nightly BiPAP. Cardiology considering RHC  Acute on chronic diastolic CHF (congestive heart failure) (HCC) Diuresis as above, cardiology managing.  Appreciate their input.   Paroxysmal atrial fibrillation (HCC) Controlled on diltiazem .  Dose increased to 300 mg daily.  Goes in and out of atrial fibrillation. Eliquis  for anticoagulation Cardiology following, their input is appreciated.   Hypokalemia As needed repletion   Sepsis secondary to cellulitis of left lower extremity Cellulitis of right breast Completed course of antibiotic   Type 2 diabetes mellitus with hyperglycemia, with long-term current use of insulin  (HCC) Accu-Chek sliding scale.  Continue long-acting insulin .  Adjust as necessary Hemoglobin A1C 8.0% Diabetic Diet   CAD S/P percutaneous coronary angioplasty No clinical evidence of ischemia Patient being evaluated for PCSK9 inhibitor as outpatient Discontinued aspirin  as patient is now on Eliquis  for paroxysmal intrafibrillation.  Gastroesophageal reflux disease Continue Protonix    Iron  deficiency anemia Hemoglobin stable.  Hemoglobin baseline around 10.0   Open wound of plantar aspect of left foot Continue daily dressing changes   Open wound of genital labia Continue daily dressing  changes   Open abdominal wall wound, initial encounter Continue daily dressing changes   Breast wound, left, initial encounter Continue daily dressing changes     DVT  prophylaxis: apixaban  (ELIQUIS ) tablet 5 mg     Code Status: Full Code Family Communication:   Status is: Inpatient Remains inpatient appropriate because: Ongoing management by cardiology team    Subjective: Seen at bedside, feels okay.  Tells me she has lost quite a bit of weight during this hospitalization but in terms of breathing she feels much better.   Examination:  General exam: Appears calm and comfortable  Respiratory system: Clear to auscultation. Respiratory effort normal. Cardiovascular system: S1 & S2 heard, RRR. No JVD, murmurs, rubs, gallops or clicks. No pedal edema. Gastrointestinal system: Abdomen is nondistended, soft and nontender. No organomegaly or masses felt. Normal bowel sounds heard. Central nervous system: Alert and oriented. No focal neurological deficits. Extremities: Symmetric 5 x 5 power. Skin: Right lower extremity wound which are dressed.  Right sided AKA Psychiatry: Judgement and insight appear normal. Mood & affect appropriate. Foley catheter in place               Diet Orders (From admission, onward)     Start     Ordered   09/10/23 2000  Diet heart healthy/carb modified Room service appropriate? Yes; Fluid consistency: Thin  Diet effective now       Question Answer Comment  Diet-HS Snack? Nothing   Room service appropriate? Yes   Fluid consistency: Thin      09/10/23 2001            Objective: Vitals:   09/25/23 0500 09/25/23 0515 09/25/23 0833 09/25/23 0836  BP:  123/69    Pulse:  74    Resp:  18    Temp:  97.7 F (36.5 C)    TempSrc:  Oral    SpO2:  92% 92% 92%  Weight: 121.8 kg     Height:        Intake/Output Summary (Last 24 hours) at 09/25/2023 1103 Last data filed at 09/25/2023 0830 Gross per 24 hour  Intake 720 ml  Output 1450 ml  Net -730 ml   Filed Weights   09/23/23 0500 09/24/23 0500 09/25/23 0500  Weight: 133.6 kg 131.9 kg 121.8 kg    Scheduled Meds:  apixaban   5 mg Oral BID   arformoterol    15 mcg Nebulization BID   budesonide  (PULMICORT ) nebulizer solution  0.25 mg Nebulization BID   Chlorhexidine  Gluconate Cloth  6 each Topical Daily   diltiazem   300 mg Oral Daily   ferrous sulfate   325 mg Oral BID WC   fluticasone   1 spray Each Nare Daily   furosemide   80 mg Intravenous BID   gabapentin   400 mg Oral BID   gabapentin   800 mg Oral QHS   Gerhardt's butt cream   Topical BID   guaiFENesin   600 mg Oral BID   insulin  aspart  0-20 Units Subcutaneous TID AC & HS   insulin  aspart  10 Units Subcutaneous TID WC   insulin  glargine-yfgn  30 Units Subcutaneous QHS   levothyroxine   200 mcg Oral QAC breakfast   lidocaine   2 patch Transdermal Daily   mupirocin  ointment   Topical BID   nystatin    Topical TID   pantoprazole   40 mg Oral Daily   potassium chloride   40 mEq Oral Q4H   pramipexole   1 mg Oral TID   pregabalin   50 mg Oral  TID   sodium chloride  flush  3 mL Intravenous Q12H   spironolactone   50 mg Oral Daily   Continuous Infusions:  Nutritional status     Body mass index is 48.33 kg/m.  Data Reviewed:   CBC: Recent Labs  Lab 09/19/23 0357 09/20/23 0339 09/22/23 0359 09/24/23 1031 09/25/23 0347  WBC 6.7 6.8 7.9 8.3 7.9  NEUTROABS 4.6 4.6 5.2 5.0  --   HGB 9.3* 9.0* 10.1* 11.4* 10.7*  HCT 37.9 36.4 39.9 43.8 41.9  MCV 76.0* 74.9* 75.3* 77.0* 77.7*  PLT 280 262 336 384 376   Basic Metabolic Panel: Recent Labs  Lab 09/20/23 0339 09/21/23 0352 09/22/23 0359 09/23/23 0424 09/24/23 1031 09/25/23 0344 09/25/23 0347  NA 133* 133* 135 136 131*  --  134*  K 3.6 3.3* 3.3* 3.3* 3.3*  --  3.2*  CL 81* 77* 77* 76* 74*  --  78*  CO2 38* 41* 43* >45* 42*  --  43*  GLUCOSE 212* 251* 142* 128* 182*  --  141*  BUN 13 18 23  25* 22  --  22  CREATININE 0.91 1.25* 1.31* 1.21* 1.15*  --  1.20*  CALCIUM  8.4* 8.8* 8.6* 8.9 8.7*  --  8.9  MG 2.1 2.1 1.9 2.2 2.1  --   --   PHOS  --   --   --   --   --  4.3  --    GFR: Estimated Creatinine Clearance: 61 mL/min (A) (by  C-G formula based on SCr of 1.2 mg/dL (H)). Liver Function Tests: Recent Labs  Lab 09/20/23 0339 09/21/23 0352 09/22/23 0359 09/23/23 0424  AST 38 55* 50* 40  ALT 18 23 23 21   ALKPHOS 74 79 73 75  BILITOT 0.7 1.1 1.2 0.9  PROT 7.1 8.0 7.4 7.6  ALBUMIN 3.1* 3.6 3.3* 3.3*   No results for input(s): LIPASE, AMYLASE in the last 168 hours. No results for input(s): AMMONIA in the last 168 hours. Coagulation Profile: No results for input(s): INR, PROTIME in the last 168 hours. Cardiac Enzymes: No results for input(s): CKTOTAL, CKMB, CKMBINDEX, TROPONINI in the last 168 hours. BNP (last 3 results) No results for input(s): PROBNP in the last 8760 hours. HbA1C: No results for input(s): HGBA1C in the last 72 hours. CBG: Recent Labs  Lab 09/24/23 1126 09/24/23 1654 09/24/23 2043 09/25/23 0250 09/25/23 0730  GLUCAP 216* 160* 295* 155* 162*   Lipid Profile: No results for input(s): CHOL, HDL, LDLCALC, TRIG, CHOLHDL, LDLDIRECT in the last 72 hours. Thyroid  Function Tests: No results for input(s): TSH, T4TOTAL, FREET4, T3FREE, THYROIDAB in the last 72 hours. Anemia Panel: No results for input(s): VITAMINB12, FOLATE, FERRITIN, TIBC, IRON , RETICCTPCT in the last 72 hours. Sepsis Labs: No results for input(s): PROCALCITON, LATICACIDVEN in the last 168 hours.  No results found for this or any previous visit (from the past 240 hours).       Radiology Studies: No results found.         LOS: 15 days   Time spent= 35 mins    Burgess JAYSON Dare, MD [facility]  If 7PM-7AM, please contact night-coverage  09/25/2023, 11:03 AM

## 2023-09-26 ENCOUNTER — Encounter (HOSPITAL_COMMUNITY)
Admission: EM | Disposition: A | Discharge status: Home Health | Payer: Self-pay | Source: Home / Self Care | Attending: Internal Medicine

## 2023-09-26 DIAGNOSIS — L03116 Cellulitis of left lower limb: Secondary | ICD-10-CM | POA: Diagnosis not present

## 2023-09-26 HISTORY — PX: RIGHT HEART CATH: CATH118263

## 2023-09-26 LAB — POCT I-STAT EG7
Acid-Base Excess: 18 mmol/L — ABNORMAL HIGH (ref 0.0–2.0)
Acid-Base Excess: 20 mmol/L — ABNORMAL HIGH (ref 0.0–2.0)
Bicarbonate: 48.4 mmol/L — ABNORMAL HIGH (ref 20.0–28.0)
Bicarbonate: 49.8 mmol/L — ABNORMAL HIGH (ref 20.0–28.0)
Calcium, Ion: 1.1 mmol/L — ABNORMAL LOW (ref 1.15–1.40)
Calcium, Ion: 1.13 mmol/L — ABNORMAL LOW (ref 1.15–1.40)
HCT: 46 % (ref 36.0–46.0)
HCT: 46 % (ref 36.0–46.0)
Hemoglobin: 15.6 g/dL — ABNORMAL HIGH (ref 12.0–15.0)
Hemoglobin: 15.6 g/dL — ABNORMAL HIGH (ref 12.0–15.0)
O2 Saturation: 54 %
O2 Saturation: 56 %
Potassium: 4 mmol/L (ref 3.5–5.1)
Potassium: 4.1 mmol/L (ref 3.5–5.1)
Sodium: 137 mmol/L (ref 135–145)
Sodium: 137 mmol/L (ref 135–145)
TCO2: >50 mmol/L — ABNORMAL HIGH (ref 22–32)
TCO2: >50 mmol/L — ABNORMAL HIGH (ref 22–32)
pCO2, Ven: 78.1 mmHg (ref 44–60)
pCO2, Ven: 79 mmHg (ref 44–60)
pH, Ven: 7.4 (ref 7.25–7.43)
pH, Ven: 7.407 (ref 7.25–7.43)
pO2, Ven: 30 mmHg — CL (ref 32–45)
pO2, Ven: 32 mmHg (ref 32–45)

## 2023-09-26 LAB — CBC
HCT: 45.4 % (ref 36.0–46.0)
Hemoglobin: 11.5 g/dL — ABNORMAL LOW (ref 12.0–15.0)
MCH: 19.9 pg — ABNORMAL LOW (ref 26.0–34.0)
MCHC: 25.3 g/dL — ABNORMAL LOW (ref 30.0–36.0)
MCV: 78.7 fL — ABNORMAL LOW (ref 80.0–100.0)
Platelets: 397 10*3/uL (ref 150–400)
RBC: 5.77 MIL/uL — ABNORMAL HIGH (ref 3.87–5.11)
RDW: 32.7 % — ABNORMAL HIGH (ref 11.5–15.5)
WBC: 7.5 10*3/uL (ref 4.0–10.5)
nRBC: 0 % (ref 0.0–0.2)

## 2023-09-26 LAB — PHOSPHORUS: Phosphorus: 5.1 mg/dL — ABNORMAL HIGH (ref 2.5–4.6)

## 2023-09-26 LAB — BASIC METABOLIC PANEL
Anion gap: 13 (ref 5–15)
BUN: 22 mg/dL (ref 8–23)
CO2: 40 mmol/L — ABNORMAL HIGH (ref 22–32)
Calcium: 9.5 mg/dL (ref 8.9–10.3)
Chloride: 84 mmol/L — ABNORMAL LOW (ref 98–111)
Creatinine, Ser: 1.23 mg/dL — ABNORMAL HIGH (ref 0.44–1.00)
GFR, Estimated: 50 mL/min — ABNORMAL LOW (ref >60)
Glucose, Bld: 130 mg/dL — ABNORMAL HIGH (ref 70–99)
Potassium: 4 mmol/L (ref 3.5–5.1)
Sodium: 137 mmol/L (ref 135–145)

## 2023-09-26 LAB — GLUCOSE, CAPILLARY
Glucose-Capillary: 136 mg/dL — ABNORMAL HIGH (ref 70–99)
Glucose-Capillary: 148 mg/dL — ABNORMAL HIGH (ref 70–99)
Glucose-Capillary: 171 mg/dL — ABNORMAL HIGH (ref 70–99)
Glucose-Capillary: 235 mg/dL — ABNORMAL HIGH (ref 70–99)

## 2023-09-26 LAB — MAGNESIUM: Magnesium: 2.2 mg/dL (ref 1.7–2.4)

## 2023-09-26 SURGERY — RIGHT HEART CATH

## 2023-09-26 MED ORDER — SODIUM CHLORIDE 0.9% FLUSH
3.0000 mL | INTRAVENOUS | Status: DC | PRN
Start: 2023-09-26 — End: 2023-09-26

## 2023-09-26 MED ORDER — SODIUM CHLORIDE 0.9% FLUSH
3.0000 mL | Freq: Two times a day (BID) | INTRAVENOUS | Status: DC
  Administered 2023-09-26: 3 mL via INTRAVENOUS

## 2023-09-26 MED ORDER — HYDRALAZINE HCL 20 MG/ML IJ SOLN: 10.0000 mg | INTRAMUSCULAR | Status: AC | PRN

## 2023-09-26 MED ORDER — SODIUM CHLORIDE 0.9 % IV SOLN: INTRAVENOUS | Status: DC

## 2023-09-26 MED ORDER — SODIUM CHLORIDE 0.9% FLUSH: 3.0000 mL | INTRAVENOUS | Status: DC | PRN

## 2023-09-26 MED ORDER — SODIUM CHLORIDE 0.9 % IV SOLN
250.0000 mL | INTRAVENOUS | Status: DC | PRN
Start: 2023-09-26 — End: 2023-09-27

## 2023-09-26 MED ORDER — LABETALOL HCL 5 MG/ML IV SOLN: 10.0000 mg | INTRAVENOUS | Status: AC | PRN

## 2023-09-26 MED ORDER — LIDOCAINE HCL (PF) 1 % IJ SOLN
INTRAMUSCULAR | Status: DC | PRN
  Administered 2023-09-26: 5 mL via INTRADERMAL

## 2023-09-26 MED ORDER — SODIUM CHLORIDE 0.9 % IV SOLN: 250.0000 mL | INTRAVENOUS | Status: DC | PRN

## 2023-09-26 MED ORDER — LIDOCAINE HCL (PF) 1 % IJ SOLN
INTRAMUSCULAR | Status: AC
  Filled 2023-09-26: qty 30

## 2023-09-26 MED ORDER — HEPARIN (PORCINE) IN NACL 1000-0.9 UT/500ML-% IV SOLN
INTRAVENOUS | Status: DC | PRN
  Administered 2023-09-26: 500 mL

## 2023-09-26 MED ORDER — ACETAMINOPHEN 325 MG PO TABS
650.0000 mg | ORAL_TABLET | ORAL | Status: DC | PRN
  Administered 2023-09-28: 650 mg via ORAL
  Filled 2023-09-26: qty 2

## 2023-09-26 SURGICAL SUPPLY — 8 items
CATH BALLN WEDGE 5F 110CM (CATHETERS) IMPLANT
PACK CARDIAC CATHETERIZATION (CUSTOM PROCEDURE TRAY) IMPLANT
SHEATH GLIDE SLENDER 4/5FR (SHEATH) IMPLANT
SHEATH PROBE COVER 6X72 (BAG) IMPLANT
TRANSDUCER W/STOPCOCK (MISCELLANEOUS) IMPLANT
TUBING ART PRESS 72 MALE/FEM (TUBING) IMPLANT
WIRE EMERALD 3MM-J .025X260CM (WIRE) IMPLANT
WIRE MICROINTRODUCER 60CM (WIRE) IMPLANT

## 2023-09-26 NOTE — Plan of Care (Signed)
  Problem: Fluid Volume: Goal: Hemodynamic stability will improve Outcome: Progressing   Problem: Clinical Measurements: Goal: Diagnostic test results will improve Outcome: Progressing Goal: Signs and symptoms of infection will decrease Outcome: Progressing   Problem: Respiratory: Goal: Ability to maintain adequate ventilation will improve Outcome: Progressing   Problem: Health Behavior/Discharge Planning: Goal: Ability to manage health-related needs will improve Outcome: Progressing   Problem: Clinical Measurements: Goal: Ability to maintain clinical measurements within normal limits will improve Outcome: Progressing Goal: Will remain free from infection Outcome: Progressing Goal: Diagnostic test results will improve Outcome: Progressing Goal: Respiratory complications will improve Outcome: Progressing Goal: Cardiovascular complication will be avoided Outcome: Progressing   Problem: Activity: Goal: Risk for activity intolerance will decrease Outcome: Progressing   Problem: Nutrition: Goal: Adequate nutrition will be maintained Outcome: Progressing   Problem: Coping: Goal: Level of anxiety will decrease Outcome: Progressing   Problem: Elimination: Goal: Will not experience complications related to bowel motility Outcome: Progressing Goal: Will not experience complications related to urinary retention Outcome: Progressing   Problem: Pain Management: Goal: General experience of comfort will improve Outcome: Progressing   Problem: Safety: Goal: Ability to remain free from injury will improve Outcome: Progressing   Problem: Skin Integrity: Goal: Risk for impaired skin integrity will decrease Outcome: Progressing   Problem: Education: Goal: Ability to describe self-care measures that may prevent or decrease complications (Diabetes Survival Skills Education) will improve Outcome: Progressing Goal: Individualized Educational Video(s) Outcome: Progressing    Problem: Coping: Goal: Ability to adjust to condition or change in health will improve Outcome: Progressing   Problem: Fluid Volume: Goal: Ability to maintain a balanced intake and output will improve Outcome: Progressing   Problem: Health Behavior/Discharge Planning: Goal: Ability to identify and utilize available resources and services will improve Outcome: Progressing Goal: Ability to manage health-related needs will improve Outcome: Progressing   Problem: Metabolic: Goal: Ability to maintain appropriate glucose levels will improve Outcome: Progressing   Problem: Nutritional: Goal: Maintenance of adequate nutrition will improve Outcome: Progressing Goal: Progress toward achieving an optimal weight will improve Outcome: Progressing   Problem: Skin Integrity: Goal: Risk for impaired skin integrity will decrease Outcome: Progressing   Problem: Tissue Perfusion: Goal: Adequacy of tissue perfusion will improve Outcome: Progressing

## 2023-09-26 NOTE — Progress Notes (Signed)
 OT Cancellation Note  Patient Details Name: [name] MRN: [id number] DOB: April 30, 1961   Cancelled Treatment:    Reason Eval/Treat Not Completed: Patient at procedure or test/ unavailable Patient transitioning to [facility] for cardiac cath today. OT to follow and check back as schedule will allow.  Wynette Heckler, MS Acute Rehabilitation Department Office# [phone] 09/26/2023, 9:22 AM

## 2023-09-26 NOTE — Progress Notes (Signed)
 [facility] at bedside to transport patient to [facility].  Pt reports feeling well, NAD.  [facility] given handoff, pt ready for transport.

## 2023-09-26 NOTE — Interval H&P Note (Signed)
 History and Physical Interval Note:  09/26/2023 12:59 PM  [name]  has presented today for surgery, with the diagnosis of heart failure.  The various methods of treatment have been discussed with the patient and family. After consideration of risks, benefits and other options for treatment, the patient has consented to  Procedure(s): RIGHT HEART CATH (N/A) as a surgical intervention.  The patient's history has been reviewed, patient examined, no change in status, stable for surgery.  I have reviewed the patient's chart and labs.  Questions were answered to the patient's satisfaction.     Annayah Worthley J Vaniyah Lansky

## 2023-09-26 NOTE — Progress Notes (Signed)
 Received patient from [facility] via [facility].  Alert and oriented X 4 , skin warm and dry, O2 at 6 liters, VSS, no complaints fo pain or any discomfort.  Foley draining yellow urine.  Pt waiting to talk to MD about procedure and sign consent.  Call  bell in reach, pt on bedside monitor.

## 2023-09-26 NOTE — Progress Notes (Addendum)
   09/26/23 2338  BiPAP/CPAP/SIPAP  BiPAP/CPAP/SIPAP Pt Type Adult  BiPAP/CPAP/SIPAP DREAMSTATIOND  Mask Type Full face mask  Mask Size Medium  Flow Rate 6 lpm  Patient Home Equipment No  Auto Titrate Yes (autobipap, Imax20cm Emin5c, PS5-8cm)  CPAP/SIPAP surface wiped down Yes   Pt immediately pulled bipap mask off upon placement.  Pt stated, "I can't do this."  This writer placed pt back on 6L nasal cannula.  RN aware.

## 2023-09-26 NOTE — Progress Notes (Signed)
 PROGRESS NOTE    [name]  RUE:[id number] DOB: Jul 31, 1961 DOA: 09/10/2023 PCP: Jobe Mulder, DO    Brief Narrative:  63 y.o. female with medical history significant of morbid obesity, right AKA, irritable bowel syndrome, HTN, HLD, DM, RA, asthma who presented to ED at her PCPs recommendation with worsening cellulitis of right breast.   Of note, patient initially presented to ED on 09/03/2023 and was diagnosed with left lower extremity as well as cellulitis under right breast and was discharged home on cefadroxil .     Due to patient's worsening symptoms of the right breast and left lower extremity patient presented to [facility] emergency department on 12/30.   Upon this admission found to have sepsis secondary to right breast cellulitis completed 7 days of IV Rocephin  on 1/6.   Hospital course was complicated by progressive hypoxia secondary to diastolic CHF, getting IV diuretics per cardiology.  Furthermore A-fib is being managed with help of cardiology team as well.   Assessment & Plan:  Principal Problem:   Cellulitis of left lower extremity Active Problems:   Acute on chronic diastolic CHF (congestive heart failure) (HCC)   Type 2 diabetes mellitus with hyperglycemia, with long-term current use of insulin  (HCC)   CAD S/P percutaneous coronary angioplasty   Iron  deficiency anemia   Mixed connective tissue disease (HCC)   Gastroesophageal reflux disease   Morbid obesity (HCC)   Sepsis due to cellulitis (HCC)   Paroxysmal atrial fibrillation (HCC)   Acute on chronic heart failure with preserved ejection fraction (HFpEF) (HCC)   Acute on chronic respiratory failure with hypoxia and hypercapnia (HCC)   Cellulitis of right breast   Open wound of plantar aspect of left foot   Open wound of genital labia   Open abdominal wall wound, initial encounter   Breast wound, left, initial encounter   Hypokalemia    Acute on chronic respiratory failure with  hypoxia and hypercapnia (HCC) ruled out.  Seeing patient for the first time and rounding today.  She tells me that she has never required any home oxygen .  Currently she is on 4 L of oxygen  and saturating 90%.  She qualifies for acute hypoxic respiratory failure.  Acute on chronic respiratory failure with hypoxia and hypercapnia as mentioned in previous notes is ruled out.  -Combination of diastolic CHF, obesity hypoventilation syndrome/OSA.  Currently getting IV diuretics per cardiology team.  Encouraging nightly BiPAP. RHC today  Acute on chronic diastolic CHF (congestive heart failure) (HCC) Diuresis as above, cardiology managing.  Appreciate their input.   Paroxysmal atrial fibrillation (HCC) Controlled on diltiazem  300 mg daily.  Goes in and out of atrial fibrillation. Eliquis  for anticoagulation Cardiology following, their input is appreciated.   Hypokalemia As needed repletion   Sepsis secondary to cellulitis of left lower extremity Cellulitis of right breast Completed course of antibiotic   Insulin -dependent diabetes mellitus type 2 with hyperglycemia Peripheral neuropathy Accu-Chek sliding scale.  Continue long-acting insulin .  Adjust as necessary Hemoglobin A1C 8.0% Continue gabapentin  Diabetic Diet   CAD S/P percutaneous coronary angioplasty Patient being evaluated for PCSK9 inhibitor as outpatient Discontinued aspirin  as patient is now on Eliquis  for paroxysmal intrafibrillation.  Elevated TSH - TSH around 5.0.  Will repeat along with free T4 Synthroid  200 mcg daily  Gastroesophageal reflux disease Continue Protonix    Iron  deficiency anemia Hemoglobin stable.  Hemoglobin baseline around 10.0   Open wound of plantar aspect of left foot Continue daily dressing changes   Open wound  of genital labia Continue daily dressing changes   Open abdominal wall wound, initial encounter Continue daily dressing changes   Breast wound, left, initial encounter Continue  daily dressing changes     DVT prophylaxis: apixaban  (ELIQUIS ) tablet 5 mg     Code Status: Full Code Family Communication: Husband at bedside Status is: Inpatient Remains inpatient appropriate because: Ongoing management by cardiology team. RHC today    Subjective: No acute events overnight Seen right before her transfer for RHC   Examination:  General exam: Appears calm and comfortable  Respiratory system: Clear to auscultation. Respiratory effort normal. Cardiovascular system: S1 & S2 heard, RRR. No JVD, murmurs, rubs, gallops or clicks. No pedal edema. Gastrointestinal system: Abdomen is nondistended, soft and nontender. No organomegaly or masses felt. Normal bowel sounds heard. Central nervous system: Alert and oriented. No focal neurological deficits. Extremities: Symmetric 5 x 5 power. Skin: Right lower extremity wound which are dressed.  Right sided AKA Psychiatry: Judgement and insight appear normal. Mood & affect appropriate. Foley catheter in place               Diet Orders (From admission, onward)     Start     Ordered   09/26/23 0403  Diet NPO time specified Except for: Sips with Meds  Diet effective midnight       Comments: NPO for solid foods after midnight, may have clear liquids until 5am, then NPO (this would be for inpatients and outpatients)  Question:  Except for  Answer:  Sips with Meds   09/26/23 0404            Objective: Vitals:   09/26/23 0452 09/26/23 0753 09/26/23 0755 09/26/23 1000  BP: 105/82     Pulse: 76     Resp: 18   12  Temp: 98 F (36.7 C)   (!) 97.4 F (36.3 C)  TempSrc: Oral     SpO2: 95% 95% 95%   Weight: 129.1 kg     Height:        Intake/Output Summary (Last 24 hours) at 09/26/2023 1254 Last data filed at 09/26/2023 0900 Gross per 24 hour  Intake 1080 ml  Output 3000 ml  Net -1920 ml   Filed Weights   09/24/23 0500 09/25/23 0500 09/26/23 0452  Weight: 131.9 kg 121.8 kg 129.1 kg    Scheduled Meds:   [MAR Hold] apixaban   5 mg Oral BID   [MAR Hold] arformoterol   15 mcg Nebulization BID   [MAR Hold] budesonide  (PULMICORT ) nebulizer solution  0.25 mg Nebulization BID   [MAR Hold] Chlorhexidine  Gluconate Cloth  6 each Topical Daily   [MAR Hold] diltiazem   300 mg Oral Daily   [MAR Hold] ferrous sulfate   325 mg Oral BID WC   [MAR Hold] fluticasone   1 spray Each Nare Daily   [MAR Hold] furosemide   80 mg Intravenous BID   [MAR Hold] gabapentin   400 mg Oral BID   [MAR Hold] gabapentin   800 mg Oral QHS   [MAR Hold] Gerhardt's butt cream   Topical BID   [MAR Hold] guaiFENesin   600 mg Oral BID   [MAR Hold] insulin  aspart  0-20 Units Subcutaneous TID AC & HS   [MAR Hold] insulin  aspart  10 Units Subcutaneous TID WC   [MAR Hold] insulin  glargine-yfgn  30 Units Subcutaneous QHS   [MAR Hold] levothyroxine   200 mcg Oral QAC breakfast   [MAR Hold] lidocaine   2 patch Transdermal Daily   [MAR Hold] mupirocin  ointment  Topical BID   [MAR Hold] nystatin    Topical TID   [MAR Hold] pantoprazole   40 mg Oral Daily   [MAR Hold] pramipexole   1 mg Oral TID   [MAR Hold] pregabalin   50 mg Oral TID   [MAR Hold] sodium chloride  flush  3 mL Intravenous Q12H   [MAR Hold] spironolactone   50 mg Oral Daily   Continuous Infusions:  sodium chloride      sodium chloride       Nutritional status     Body mass index is 51.23 kg/m.  Data Reviewed:   CBC: Recent Labs  Lab 09/20/23 0339 09/22/23 0359 09/24/23 1031 09/25/23 0347 09/26/23 0351  WBC 6.8 7.9 8.3 7.9 7.5  NEUTROABS 4.6 5.2 5.0  --   --   HGB 9.0* 10.1* 11.4* 10.7* 11.5*  HCT 36.4 39.9 43.8 41.9 45.4  MCV 74.9* 75.3* 77.0* 77.7* 78.7*  PLT 262 336 384 376 397   Basic Metabolic Panel: Recent Labs  Lab 09/21/23 0352 09/22/23 0359 09/23/23 0424 09/24/23 1031 09/25/23 0344 09/25/23 0347 09/26/23 0351  NA 133* 135 136 131*  --  134* 137  K 3.3* 3.3* 3.3* 3.3*  --  3.2* 4.0  CL 77* 77* 76* 74*  --  78* 84*  CO2 41* 43* >45* 42*  --  43*  40*  GLUCOSE 251* 142* 128* 182*  --  141* 130*  BUN 18 23 25* 22  --  22 22  CREATININE 1.25* 1.31* 1.21* 1.15*  --  1.20* 1.23*  CALCIUM  8.8* 8.6* 8.9 8.7*  --  8.9 9.5  MG 2.1 1.9 2.2 2.1  --   --  2.2  PHOS  --   --   --   --  4.3  --  5.1*   GFR: Estimated Creatinine Clearance: 61.7 mL/min (A) (by C-G formula based on SCr of 1.23 mg/dL (H)). Liver Function Tests: Recent Labs  Lab 09/20/23 0339 09/21/23 0352 09/22/23 0359 09/23/23 0424  AST 38 55* 50* 40  ALT 18 23 23 21   ALKPHOS 74 79 73 75  BILITOT 0.7 1.1 1.2 0.9  PROT 7.1 8.0 7.4 7.6  ALBUMIN 3.1* 3.6 3.3* 3.3*   No results for input(s): "LIPASE", "AMYLASE" in the last 168 hours. No results for input(s): "AMMONIA" in the last 168 hours. Coagulation Profile: No results for input(s): "INR", "PROTIME" in the last 168 hours. Cardiac Enzymes: No results for input(s): "CKTOTAL", "CKMB", "CKMBINDEX", "TROPONINI" in the last 168 hours. BNP (last 3 results) No results for input(s): "PROBNP" in the last 8760 hours. HbA1C: No results for input(s): "HGBA1C" in the last 72 hours. CBG: Recent Labs  Lab 09/25/23 1153 09/25/23 1631 09/25/23 2053 09/26/23 0251 09/26/23 0727  GLUCAP 326* 114* 255* 136* 148*   Lipid Profile: No results for input(s): "CHOL", "HDL", "LDLCALC", "TRIG", "CHOLHDL", "LDLDIRECT" in the last 72 hours. Thyroid  Function Tests: No results for input(s): "TSH", "T4TOTAL", "FREET4", "T3FREE", "THYROIDAB" in the last 72 hours. Anemia Panel: No results for input(s): "VITAMINB12", "FOLATE", "FERRITIN", "TIBC", "IRON ", "RETICCTPCT" in the last 72 hours. Sepsis Labs: No results for input(s): "PROCALCITON", "LATICACIDVEN" in the last 168 hours.  No results found for this or any previous visit (from the past 240 hours).       Radiology Studies: No results found.         LOS: 16 days   Time spent= 35 mins    Maggie Schooner, MD [facility]  If 7PM-7AM, please contact  night-coverage  09/26/2023, 12:54 PM

## 2023-09-27 ENCOUNTER — Telehealth: Payer: Self-pay

## 2023-09-27 ENCOUNTER — Encounter (HOSPITAL_COMMUNITY): Payer: Self-pay | Admitting: Cardiology

## 2023-09-27 DIAGNOSIS — L03116 Cellulitis of left lower limb: Secondary | ICD-10-CM | POA: Diagnosis not present

## 2023-09-27 DIAGNOSIS — R400 Somnolence: Secondary | ICD-10-CM

## 2023-09-27 LAB — CBC
HCT: 46.7 % — ABNORMAL HIGH (ref 36.0–46.0)
Hemoglobin: 11.9 g/dL — ABNORMAL LOW (ref 12.0–15.0)
MCH: 19.8 pg — ABNORMAL LOW (ref 26.0–34.0)
MCHC: 25.5 g/dL — ABNORMAL LOW (ref 30.0–36.0)
MCV: 77.6 fL — ABNORMAL LOW (ref 80.0–100.0)
Platelets: 410 10*3/uL — ABNORMAL HIGH (ref 150–400)
RBC: 6.02 MIL/uL — ABNORMAL HIGH (ref 3.87–5.11)
RDW: 32.7 % — ABNORMAL HIGH (ref 11.5–15.5)
WBC: 7.8 10*3/uL (ref 4.0–10.5)
nRBC: 0 % (ref 0.0–0.2)

## 2023-09-27 LAB — GLUCOSE, CAPILLARY
Glucose-Capillary: 186 mg/dL — ABNORMAL HIGH (ref 70–99)
Glucose-Capillary: 178 mg/dL — ABNORMAL HIGH (ref 70–99)
Glucose-Capillary: 197 mg/dL — ABNORMAL HIGH (ref 70–99)
Glucose-Capillary: 232 mg/dL — ABNORMAL HIGH (ref 70–99)
Glucose-Capillary: 205 mg/dL — ABNORMAL HIGH (ref 70–99)
Glucose-Capillary: 275 mg/dL — ABNORMAL HIGH (ref 70–99)

## 2023-09-27 LAB — BASIC METABOLIC PANEL
Anion gap: 15 (ref 5–15)
BUN: 22 mg/dL (ref 8–23)
CO2: 38 mmol/L — ABNORMAL HIGH (ref 22–32)
Calcium: 9.2 mg/dL (ref 8.9–10.3)
Chloride: 81 mmol/L — ABNORMAL LOW (ref 98–111)
Creatinine, Ser: 1.16 mg/dL — ABNORMAL HIGH (ref 0.44–1.00)
GFR, Estimated: 53 mL/min — ABNORMAL LOW (ref >60)
Glucose, Bld: 154 mg/dL — ABNORMAL HIGH (ref 70–99)
Potassium: 3.5 mmol/L (ref 3.5–5.1)
Sodium: 134 mmol/L — ABNORMAL LOW (ref 135–145)

## 2023-09-27 LAB — IRON AND TIBC
Iron: 45 ug/dL (ref 28–170)
Saturation Ratios: 9 % — ABNORMAL LOW (ref 10.4–31.8)
TIBC: 531 ug/dL — ABNORMAL HIGH (ref 250–450)
UIBC: 486 ug/dL

## 2023-09-27 LAB — T4, FREE: Free T4: 1.11 ng/dL (ref 0.61–1.12)

## 2023-09-27 LAB — FERRITIN: Ferritin: 36 ng/mL (ref 11–307)

## 2023-09-27 LAB — TSH: TSH: 4.951 u[IU]/mL — ABNORMAL HIGH (ref 0.350–4.500)

## 2023-09-27 LAB — MAGNESIUM: Magnesium: 2.2 mg/dL (ref 1.7–2.4)

## 2023-09-27 MED ORDER — POTASSIUM CHLORIDE CRYS ER 20 MEQ PO TBCR
40.0000 meq | EXTENDED_RELEASE_TABLET | Freq: Once | ORAL | Status: AC
  Administered 2023-09-27: 40 meq via ORAL
  Filled 2023-09-27: qty 2

## 2023-09-27 MED ORDER — TORSEMIDE 20 MG PO TABS
40.0000 mg | ORAL_TABLET | Freq: Every day | ORAL | Status: DC
  Administered 2023-09-28 – 2023-10-01 (×4): 40 mg via ORAL
  Filled 2023-09-27 (×4): qty 2

## 2023-09-27 MED ORDER — DILTIAZEM HCL ER COATED BEADS 180 MG PO CP24
360.0000 mg | ORAL_CAPSULE | Freq: Every day | ORAL | Status: DC
  Administered 2023-09-28 – 2023-10-01 (×4): 360 mg via ORAL
  Filled 2023-09-27 (×4): qty 2

## 2023-09-27 NOTE — Progress Notes (Signed)
Progress Note  Patient Name: [name] Date of Encounter: 09/27/2023  Primary Cardiologist: Armanda Magic, MD  Subjective   Net negative another 2.1L - underwent RHC yesterday, appeared to have fairly normal filling pressures and normal pulmonary pressure. Creatinine lower today at 1.16 (From 1.23), but sodium is low at 134.  Inpatient Medications    Scheduled Meds:  apixaban  5 mg Oral BID   arformoterol  15 mcg Nebulization BID   budesonide (PULMICORT) nebulizer solution  0.25 mg Nebulization BID   Chlorhexidine Gluconate Cloth  6 each Topical Daily   diltiazem  300 mg Oral Daily   ferrous sulfate  325 mg Oral BID WC   fluticasone  1 spray Each Nare Daily   furosemide  80 mg Intravenous BID   gabapentin  400 mg Oral BID   gabapentin  800 mg Oral QHS   Gerhardt's butt cream   Topical BID   guaiFENesin  600 mg Oral BID   insulin aspart  0-20 Units Subcutaneous TID AC & HS   insulin aspart  10 Units Subcutaneous TID WC   insulin glargine-yfgn  30 Units Subcutaneous QHS   levothyroxine  200 mcg Oral QAC breakfast   lidocaine  2 patch Transdermal Daily   mupirocin ointment   Topical BID   nystatin   Topical TID   pantoprazole  40 mg Oral Daily   pramipexole  1 mg Oral TID   pregabalin  50 mg Oral TID   sodium chloride flush  3 mL Intravenous Q12H   sodium chloride flush  3 mL Intravenous Q12H   spironolactone  50 mg Oral Daily   Continuous Infusions:  sodium chloride     PRN Meds: sodium chloride, acetaminophen, albuterol, glucagon (human recombinant), hydrALAZINE, metoprolol tartrate, ondansetron (ZOFRAN) IV, oxyCODONE, promethazine, senna-docusate, sodium chloride, sodium chloride flush, traZODone   Vital Signs    Vitals:   09/27/23 0835 09/27/23 0837 09/27/23 0840 09/27/23 0932  BP:   127/61 127/61  Pulse:   93   Resp:   16   Temp:   98.9 F (37.2 C)   TempSrc:   Oral   SpO2: 93% 93% 100%   Weight:      Height:        Intake/Output Summary (Last 24  hours) at 09/27/2023 1040 Last data filed at 09/27/2023 0939 Gross per 24 hour  Intake 280 ml  Output 1700 ml  Net -1420 ml      09/27/2023    3:29 AM 09/26/2023    4:52 AM 09/25/2023    5:00 AM  Last 3 Weights  Weight (lbs) 293 lb 6.9 oz 284 lb 9.8 oz 268 lb 8.3 oz  Weight (kg) 133.1 kg 129.1 kg 121.8 kg     Telemetry    Afib with RVR at times - personally reviewed  Physical Exam   GEN: No acute distress.  HEENT: Normocephalic, atraumatic, sclera non-icteric. Neck: No JVD or bruits. Cardiac: irregularly irregular,  no murmurs, rubs, or gallops.  Respiratory: Decreased BS bilaterally. Breathing is unlabored. GI: Soft, rounded obese pannus, BS +x 4. MS: no deformity. Extremities: No clubbing or cyanosis. S/p R AKA. LLE is erythematous and wrapped with edema Neuro:  AAOx3. Follows commands. Psych:  Responds to questions appropriately with a normal affect.  Labs    High Sensitivity Troponin:   Recent Labs  Lab 09/10/23 1434  TROPONINIHS 17      Cardiac EnzymesNo results for input(s): "TROPONINI" in the last 168 hours. No  results for input(s): "TROPIPOC" in the last 168 hours.   Chemistry Recent Labs  Lab 09/21/23 0352 09/22/23 0359 09/23/23 0424 09/24/23 1031 09/25/23 0347 09/26/23 0351 09/26/23 1325 09/27/23 0348  NA 133* 135 136   < > 134* 137 137  137 134*  K 3.3* 3.3* 3.3*   < > 3.2* 4.0 4.0  4.1 3.5  CL 77* 77* 76*   < > 78* 84*  --  81*  CO2 41* 43* >45*   < > 43* 40*  --  38*  GLUCOSE 251* 142* 128*   < > 141* 130*  --  154*  BUN 18 23 25*   < > 22 22  --  22  CREATININE 1.25* 1.31* 1.21*   < > 1.20* 1.23*  --  1.16*  CALCIUM 8.8* 8.6* 8.9   < > 8.9 9.5  --  9.2  PROT 8.0 7.4 7.6  --   --   --   --   --   ALBUMIN 3.6 3.3* 3.3*  --   --   --   --   --   AST 55* 50* 40  --   --   --   --   --   ALT 23 23 21   --   --   --   --   --   ALKPHOS 79 73 75  --   --   --   --   --   BILITOT 1.1 1.2 0.9  --   --   --   --   --   GFRNONAA 49* 46* 51*   < >  51* 50*  --  53*  ANIONGAP 15 15 NOT CALCULATED   < > 13 13  --  15   < > = values in this interval not displayed.     Hematology Recent Labs  Lab 09/25/23 0347 09/26/23 0351 09/26/23 1325 09/27/23 0348  WBC 7.9 7.5  --  7.8  RBC 5.39* 5.77*  --  6.02*  HGB 10.7* 11.5* 15.6*  15.6* 11.9*  HCT 41.9 45.4 46.0  46.0 46.7*  MCV 77.7* 78.7*  --  77.6*  MCH 19.9* 19.9*  --  19.8*  MCHC 25.5* 25.3*  --  25.5*  RDW 32.3* 32.7*  --  32.7*  PLT 376 397  --  410*    BNPNo results for input(s): "BNP", "PROBNP" in the last 168 hours.   DDimer No results for input(s): "DDIMER" in the last 168 hours.   Radiology    CARDIAC CATHETERIZATION Result Date: 09/26/2023 Right heart catheterization 09/26/2023: RA: 5 mmHg RV: 42/0 mmHg PA: 38/20 mmHg, mPAP 28 mmHg PCW: 13 mmHg AO sats: 87% PA sats: 55% CO: 5.8 L/min CI: 2.6 L/min/m2 Mild pulmonary hypertension, WHP Grp II Normal filling pressures at rest Compensated HFpEF Persistent hypoxia in spute of normal filling pressures. Consider alternate etiology for hypoxia, such as OSA/OHS Elder Negus, MD    Cardiac Studies   2d echo 09/12/23   1. Left ventricular ejection fraction, by estimation, is 60 to 65%. The  left ventricle has normal function. Left ventricular endocardial border  not optimally defined to evaluate regional wall motion. Left ventricular  diastolic parameters are  indeterminate.   2. RV not well visualized, grossly appears normal in size and function. .  Right ventricular systolic function was not well visualized. The right  ventricular size is not well visualized. Tricuspid regurgitation signal is  inadequate for assessing PA  pressure.   3. The mitral valve was not well visualized. No evidence of mitral valve  regurgitation. No evidence of mitral stenosis.   4. The aortic valve was not well visualized. Aortic valve regurgitation  is not visualized. No aortic stenosis is present.   5. Technically difficult study,  poor visualization.    Patient Profile   63 y.o. female with CAD s/p DES to RCA 2017 with repeat DES to ostial RCA 2019, PAF in setting of sepsis/bacteremia, intolerance of BB in the past due to wheezing, uncontrolled DM, morbid obesity (prior suspected OSA/OHS), HTN, right AKA, hyperkalemia with ARB, medication noncompliance, chronic HFpEF admitted with cellulitis, acute on chronic HFpEF and hypoxia.   Assessment & Plan   1. Acute on chronic diastolic heart failure - has been diuresed with IV lasix (increased to 80mg  BID on 1/12) and spironolactone 50mg , -26.5L, weight 347->290lb - echo this admission with preserved LVEF, not clear images of RV, but normal RV function on TEE in 2022 - RHC does not show pulmonary hypertensionc - persistent hypokalemia - replete  -sodium lower today- based on RHC, she appears clinically compensated, will hold additional IV lasix and switch to 40 mg torsemide daily tomorrow    2. Persistent hypoxia - continues to require O2 despite IV diuresis, may be chronic need at this point - echo this admission as above - volume status difficult given body habitus - suspect a component of OSA and OHS, she has not tolerated CPAP, likely a component of PH, will need eval for home O2 at DC - RHC shows clinical compensation, will switch to oral diuretics tomorrow   3. PAF - in and out this admission per notes - anticoagulation initiated - Will increase diltiazem to 360 mg daily - avoid BB due to wheezing with this in the past - reports recent palpitations - management of recent abnormal TSH per primary team - if she persists in afib, consider outpatient DCCV   4. CAD - prior PCI/DES to distal RCA and ostial RCA, last ischemic evaluation  - she denies chest pain - ASA discontinued this admission given concomitant Eliquis - does not appear she followed up for PCSK9i, she preferred to avoid statins, can re-engage as outpatient - had MSK chest pain this admission, not  felt to have ACS, improved   5. Microcytic anemia - per primary team     For questions or updates, please contact [facility] Please consult [email or web address] for contact info under Cardiology/STEMI.  Chrystie Nose, MD, [facility], FACP  [place]  [facility]  Medical Director of the [facility]  [facility] Diplomate of the American Board of Clinical Lipidology Attending Cardiologist  Direct Dial: [phone]  Fax: [phone]  Website:  [email or web address]  Chrystie Nose, MD 09/27/2023, 10:40 AM

## 2023-09-27 NOTE — Progress Notes (Signed)
Physical Therapy Treatment Patient Details Name: [name] MRN: [id number] DOB: 1961/08/10 Today's Date: 09/27/2023   History of Present Illness [name] is a 63 y.o.presented 09/10/23 from PCP for worsening Right breast cellulitis, LLE cellulitis. PMH: morbid obesity, right AKA, irritable bowel syndrome, hypertension, hyperlipidemia, hypothyroidism, type 2 diabetes mellitus, rheumatoid arthritis, asthma    PT Comments  Pt agreeable and motivated to participate on arrival to room.  Pt on 5L O2 [place] on arrival however did not feel oxygen flow through tubing (believe humidifier somehow blocking oxygen flow so removed this piece and pt placed on 4L O2 [place] for mobilizing- RN aware).  Pt's SPO2 84% on arrival without oxygen.  Pt's SpO2 after standing was 93% on 4L O2 and improved to 97% with rest.  Pt reports fatigue and left UE tremor started (which she reports has been occurring off and on for a few weeks), so she declined any further mobility today.     If plan is discharge home, recommend the following: Two people to help with walking and/or transfers;Assist for transportation;Help with stairs or ramp for entrance;A lot of help with bathing/dressing/bathroom   Can travel by private vehicle        Equipment Recommendations  Hospital bed    Recommendations for Other Services       Precautions / Restrictions Precautions Precautions: Fall Precaution Comments: R AKA, right shoulder pain ? RTC injury/, desats Restrictions Weight Bearing Restrictions Per Provider Order: No Other Position/Activity Restrictions: RT AKA with wound so has been unable to use prosthesis, reports she also wanted to lose weight first     Mobility  Bed Mobility Overal bed mobility: Needs Assistance Bed Mobility: Supine to Sit, Sit to Supine     Supine to sit: Supervision, HOB elevated, Used rails Sit to supine: Supervision, HOB elevated        Transfers Overall transfer level: Needs  assistance Equipment used: Rolling walker (2 wheels) Transfers: Sit to/from Stand Sit to Stand: Min assist, +2 physical assistance           General transfer comment: pt pulls up on RW, states she is aware to have counter force and states her husband holds down RW at home if she needs; assist to complete rise and cues for hip extension and upright posture; able to hold standing <30 seconds before fatigue; pt did not feel able to pivot or transfer    Ambulation/Gait                   Stairs             Wheelchair Mobility     Tilt Bed    Modified Rankin (Stroke Patients Only)       Balance Overall balance assessment: Needs assistance, History of Falls Sitting-balance support: Feet supported Sitting balance-[name]: Good                                      Cognition Arousal: Alert Behavior During Therapy: WFL for tasks assessed/performed   Area of Impairment: Problem solving                             Problem Solving: Slow processing, Requires verbal cues General Comments: pt very talkative and tangential        Exercises      General Comments  Pertinent Vitals/Pain Pain Assessment Pain Assessment: 0-10 Pain Score: 7  Pain Location: right shoulder and lower back Pain Descriptors / Indicators: Grimacing, Guarding Pain Intervention(s): Repositioned, Monitored during session    Home Living                          Prior Function            PT Goals (current goals can now be found in the care plan section) Progress towards PT goals: Progressing toward goals    Frequency    Min 1X/week      PT Plan      Co-evaluation              AM-PAC PT "6 Clicks" Mobility   Outcome Measure  Help needed turning from your back to your side while in a flat bed without using bedrails?: A Little Help needed moving from lying on your back to sitting on the side of a flat bed without using  bedrails?: A Little Help needed moving to and from a bed to a chair (including a wheelchair)?: A Lot Help needed standing up from a chair using your arms (e.g., wheelchair or bedside chair)?: Total Help needed to walk in hospital room?: Total Help needed climbing 3-5 steps with a railing? : Total 6 Click Score: 11    End of Session Equipment Utilized During Treatment: Gait belt Activity Tolerance: Patient tolerated treatment well Patient left: in bed;with call bell/phone within reach;with bed alarm set   PT Visit Diagnosis: Unsteadiness on feet (R26.81);Muscle weakness (generalized) (M62.81);Difficulty in walking, not elsewhere classified (R26.2)     Time: 1426-1450 PT Time Calculation (min) (ACUTE ONLY): 24 min  Charges:    $Therapeutic Activity: 23-37 mins PT General Charges $$ ACUTE PT VISIT: 1 Visit                     Thomasene Mohair PT, DPT Physical Therapist Acute Rehabilitation Services Office: [phone]    Janan Halter [place] 09/27/2023, 4:34 PM

## 2023-09-27 NOTE — Progress Notes (Signed)
   09/27/23 2229  BiPAP/CPAP/SIPAP  BiPAP/CPAP/SIPAP Pt Type Adult  BiPAP/CPAP/SIPAP DREAMSTATIOND  Mask Type Full face mask  Mask Size Medium  Flow Rate 5 lpm  Patient Home Equipment No  Auto Titrate Yes (20-5)

## 2023-09-27 NOTE — Progress Notes (Signed)
RT placed pt on overnight pulse ox study. Pt is currently on bipap. Pt mentioned she ends up taking machine off in the middle of the night but never remembers doing it therefore RT left [place] on under bipap mask just incase pt does take machine off. RT made RN aware at this time.

## 2023-09-27 NOTE — Telephone Encounter (Signed)
-----   Message from Armanda Magic sent at 09/26/2023  3:38 PM EST ----- Please get a home sleep study [name]) ----- Message ----- From: Elder Negus, MD Sent: 09/26/2023   1:44 PM EST To: Quintella Reichert, MD; Sharlene Dory, DO  Right heart catheterization 09/26/2023: RA: 5 mmHg RV: 42/0 mmHg PA: 38/20 mmHg, mPAP 28 mmHg PCW: 13 mmHg  AO sats: 87% PA sats: 55%  CO: 5.8 L/min CI: 2.6 L/min/m2  Mild pulmonary hypertension, WHP Grp II Normal filling pressures at rest Compensated HFpEF Persistent hypoxia in spute of normal filling pressures. Consider alternate etiology for hypoxia, such as OSA/OHS

## 2023-09-27 NOTE — TOC Progression Note (Signed)
Transition of Care [facility]) - Progression Note   Patient Details  Name: [name] MRN: [id number] Date of Birth: [birth year]  Transition of Care [facility]) CM/SW Contact  Ewing Schlein, LCSW Phone Number: 09/27/2023, 2:29 PM  Clinical Narrative: Hospitalist notified CSW that patient may need to be reviewed for a home NIV. CSW made referral to [facility] with [employer] to review. CSW updated patient.  Expected Discharge Plan: Home w Home Health Services Barriers to Discharge: Continued Medical Work up  Expected Discharge Plan and Services In-house Referral: Clinical Social Work Post Acute Care Choice: Durable Medical Equipment Living arrangements for the past 2 months: Single Family Home          DME Arranged: NIV DME Agency: [employer] Date DME Agency Contacted: 09/27/23 Time DME Agency Contacted: 1315 Representative spoke with at DME Agency: Mitch  Social Determinants of Health (SDOH) Interventions SDOH Screenings   Food Insecurity: No Food Insecurity (09/11/2023)  Housing: High Risk (09/12/2023)  Transportation Needs: Unmet Transportation Needs (09/11/2023)  Utilities: Not At Risk (09/11/2023)  Alcohol Screen: Low Risk  (09/10/2023)  Depression (PHQ2-9): Low Risk  (07/10/2023)  Financial Resource Strain: Medium Risk (09/10/2023)  Physical Activity: Unknown (09/10/2023)  Social Connections: Socially Isolated (09/10/2023)  Stress: No Stress Concern Present (09/10/2023)  Tobacco Use: Medium Risk (09/10/2023)   Readmission Risk Interventions    09/27/2023    2:29 PM 12/23/2021    4:00 PM  Readmission Risk Prevention Plan  Transportation Screening Complete Complete  HRI or Home Care Consult Complete   Social Work Consult for Recovery Care Planning/Counseling Complete   Palliative Care Screening Not Applicable   Medication Review [facility]) Complete Complete  PCP or Specialist appointment within 3-5 days of discharge  Complete  HRI or Home Care Consult  Complete  SW Recovery  Care/Counseling Consult  Complete  Palliative Care Screening  Not Applicable  Skilled Nursing Facility  Not Applicable

## 2023-09-27 NOTE — Progress Notes (Signed)
Wound care to LLE not completed this shift due to not having all supplies on the floor. RN did not note in the beginning of hte shift the need for several pieces of the silver hydrofiber. There is one unopened pack in the pt's room. Call placed to materials the latter part of the shift requesting this. This has not been delivered at this time. Jessica RN charge nurse notified of this and the need to be changed when silver hydrofiber has been delivered to floor. Second call placed to materials at this time, with no answer. This will be passed on to nightshift RN in order for this wound care to be completed. The last dressing change was on 09/26/23 at approximately 0600 per the nightshift RN, Mindy RN. Dressing is CDI at this time.

## 2023-09-27 NOTE — Telephone Encounter (Signed)
[name] ordered, advised patient that someone would call to get her set up with device. Patient verbalizes understanding and agrees to plan.      Patient endorses snoring, daytime fatigue, hypertension, age >50, BMI > 35 kg/m2. STOP Bang score of 5
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
## 2023-09-27 NOTE — Progress Notes (Signed)
At approximately 1630, Found pt lying across bed with head resting on bedside table that was parallel to the right side of the bed and pt's left leg hanging off of the left side of the bed. Pt opened eyes when spoken to the closed them immediately. Difficult to arouse until RN called for assistance to pt's room. Upon arrival of Jenifer RN to room, pt immediately opened eyes and assisted with adjusting herself on the bed. Pt did not fall off of the bed. Pt did have O2 Victor removed. SPO2 assessed on RA at 75%. Louisa applied, O2 at 5 L/M increased to 94%.

## 2023-09-27 NOTE — Telephone Encounter (Signed)
Pt has appt in the office 10/04/23. I will check with Dr.Turner's nurse if ok to set up when pt comes in for her appt with Eligha Bridegroom, NP.

## 2023-09-27 NOTE — Progress Notes (Addendum)
PROGRESS NOTE    [name]  ZOX:[id number] DOB: 1961-04-07 DOA: 09/10/2023 PCP: Sharlene Dory, DO    Brief Narrative:  63 y.o. female with medical history significant of morbid obesity, right AKA, irritable bowel syndrome, HTN, HLD, DM, RA, asthma who presented to ED at her PCPs recommendation with worsening cellulitis of right breast.   Of note, patient initially presented to ED on 09/03/2023 and was diagnosed with left lower extremity as well as cellulitis under right breast and was discharged home on cefadroxil.     Due to patient's worsening symptoms of the right breast and left lower extremity patient presented to [facility] emergency department on 12/30.   Upon this admission found to have sepsis secondary to right breast cellulitis completed 7 days of IV Rocephin on 1/6.   Hospital course was complicated by progressive hypoxia secondary to diastolic CHF, getting IV diuretics per cardiology.  Furthermore A-fib is being managed with help of cardiology team as well.   Assessment & Plan:  Principal Problem:   Cellulitis of left lower extremity Active Problems:   Acute on chronic diastolic CHF (congestive heart failure) (HCC)   Type 2 diabetes mellitus with hyperglycemia, with long-term current use of insulin (HCC)   CAD S/P percutaneous coronary angioplasty   Iron deficiency anemia   Mixed connective tissue disease (HCC)   Gastroesophageal reflux disease   Morbid obesity (HCC)   Sepsis due to cellulitis (HCC)   Paroxysmal atrial fibrillation (HCC)   Acute on chronic heart failure with preserved ejection fraction (HFpEF) (HCC)   Acute on chronic respiratory failure with hypoxia and hypercapnia (HCC)   Cellulitis of right breast   Open wound of plantar aspect of left foot   Open wound of genital labia   Open abdominal wall wound, initial encounter   Breast wound, left, initial encounter   Hypokalemia    Acute on chronic respiratory failure with  hypoxia and hypercapnia (HCC) ruled out.  Seeing patient for the first time and rounding today.  She tells me that she has never required any home oxygen.  Currently she is on 4 L of oxygen and saturating 90%.  She qualifies for acute hypoxic respiratory failure.  Acute on chronic respiratory failure with hypoxia and hypercapnia as mentioned in previous notes is ruled out.  -Combination of diastolic CHF, obesity hypoventilation syndrome/OSA.  Initially underwent IV diuretics.  RHC performed on 1/15 showed mild group 2 PAH but overall now euvolemic.  Will obtain ABG at 5 AM tomorrow and overnight pulse ox has been ordered  Acute on chronic diastolic CHF (congestive heart failure) (HCC) Diuresis as above, cardiology managing.  Appreciate their input.   Paroxysmal atrial fibrillation (HCC) On Cardizem and Eliquis.   Hypokalemia As needed repletion   Sepsis secondary to cellulitis of left lower extremity Cellulitis of right breast Completed course of antibiotic   Insulin-dependent diabetes mellitus type 2 with hyperglycemia Peripheral neuropathy Accu-Chek sliding scale.  Continue long-acting insulin.  Adjust as necessary Hemoglobin A1C 8.0% Continue gabapentin Diabetic Diet   CAD S/P percutaneous coronary angioplasty Patient being evaluated for PCSK9 inhibitor as outpatient Discontinued aspirin as patient is now on Eliquis for paroxysmal intrafibrillation.  Elevated TSH - TSH 4.9, free T4 normal Synthroid 200 mcg daily  Gastroesophageal reflux disease Continue Protonix   Iron deficiency anemia Hemoglobin stable.  Hemoglobin baseline around 10.0 Check iron studies   Open wound of plantar aspect of left foot Continue daily dressing changes   Open wound of  genital labia Continue daily dressing changes   Open abdominal wall wound, initial encounter Continue daily dressing changes   Breast wound, left, initial encounter Continue daily dressing changes   Foley catheter in  place due to complicated chronic wounds  DVT prophylaxis: apixaban (ELIQUIS) tablet 5 mg     Code Status: Full Code Family Communication: Husband at bedside Status is: Inpatient Remains inpatient appropriate because: Will need placement  Subjective: Doing okay no complaints.  Tells me she has never been on any form of NIV at home.   Examination:  General exam: Appears calm and comfortable, morbidly obese Respiratory system: Clear to auscultation. Respiratory effort normal. Cardiovascular system: S1 & S2 heard, RRR. No JVD, murmurs, rubs, gallops or clicks. No pedal edema. Gastrointestinal system: Abdomen is nondistended, soft and nontender. No organomegaly or masses felt. Normal bowel sounds heard. Central nervous system: Alert and oriented. No focal neurological deficits. Extremities: Symmetric 5 x 5 power. Skin: Right lower extremity wound which are dressed.  Right sided AKA Psychiatry: Judgement and insight appear normal. Mood & affect appropriate. Foley catheter in place               Diet Orders (From admission, onward)     Start     Ordered   09/26/23 1526  Diet Heart Room service appropriate? Yes; Fluid consistency: Thin  Diet effective now       Question Answer Comment  Room service appropriate? Yes   Fluid consistency: Thin      09/26/23 1526            Objective: Vitals:   09/27/23 0835 09/27/23 0837 09/27/23 0840 09/27/23 0932  BP:   127/61 127/61  Pulse:   93   Resp:   16   Temp:   98.9 F (37.2 C)   TempSrc:   Oral   SpO2: 93% 93% 100%   Weight:      Height:        Intake/Output Summary (Last 24 hours) at 09/27/2023 1332 Last data filed at 09/27/2023 0939 Gross per 24 hour  Intake 280 ml  Output 1100 ml  Net -820 ml   Filed Weights   09/25/23 0500 09/26/23 0452 09/27/23 0329  Weight: 121.8 kg 129.1 kg 133.1 kg    Scheduled Meds:  apixaban  5 mg Oral BID   arformoterol  15 mcg Nebulization BID   budesonide (PULMICORT) nebulizer  solution  0.25 mg Nebulization BID   Chlorhexidine Gluconate Cloth  6 each Topical Daily   [START ON 09/28/2023] diltiazem  360 mg Oral Daily   ferrous sulfate  325 mg Oral BID WC   fluticasone  1 spray Each Nare Daily   gabapentin  400 mg Oral BID   gabapentin  800 mg Oral QHS   Gerhardt's butt cream   Topical BID   guaiFENesin  600 mg Oral BID   insulin aspart  0-20 Units Subcutaneous TID AC & HS   insulin aspart  10 Units Subcutaneous TID WC   insulin glargine-yfgn  30 Units Subcutaneous QHS   levothyroxine  200 mcg Oral QAC breakfast   lidocaine  2 patch Transdermal Daily   mupirocin ointment   Topical BID   nystatin   Topical TID   pantoprazole  40 mg Oral Daily   pramipexole  1 mg Oral TID   pregabalin  50 mg Oral TID   sodium chloride flush  3 mL Intravenous Q12H   sodium chloride flush  3 mL Intravenous Q12H  spironolactone  50 mg Oral Daily   [START ON 09/28/2023] torsemide  40 mg Oral Daily   Continuous Infusions:  sodium chloride      Nutritional status     Body mass index is 52.81 kg/m.  Data Reviewed:   CBC: Recent Labs  Lab 09/22/23 0359 09/24/23 1031 09/25/23 0347 09/26/23 0351 09/26/23 1325 09/27/23 0348  WBC 7.9 8.3 7.9 7.5  --  7.8  NEUTROABS 5.2 5.0  --   --   --   --   HGB 10.1* 11.4* 10.7* 11.5* 15.6*  15.6* 11.9*  HCT 39.9 43.8 41.9 45.4 46.0  46.0 46.7*  MCV 75.3* 77.0* 77.7* 78.7*  --  77.6*  PLT 336 384 376 397  --  410*   Basic Metabolic Panel: Recent Labs  Lab 09/22/23 0359 09/23/23 0424 09/24/23 1031 09/25/23 0344 09/25/23 0347 09/26/23 0351 09/26/23 1325 09/27/23 0348  NA 135 136 131*  --  134* 137 137  137 134*  K 3.3* 3.3* 3.3*  --  3.2* 4.0 4.0  4.1 3.5  CL 77* 76* 74*  --  78* 84*  --  81*  CO2 43* >45* 42*  --  43* 40*  --  38*  GLUCOSE 142* 128* 182*  --  141* 130*  --  154*  BUN 23 25* 22  --  22 22  --  22  CREATININE 1.31* 1.21* 1.15*  --  1.20* 1.23*  --  1.16*  CALCIUM 8.6* 8.9 8.7*  --  8.9 9.5  --  9.2   MG 1.9 2.2 2.1  --   --  2.2  --  2.2  PHOS  --   --   --  4.3  --  5.1*  --   --    GFR: Estimated Creatinine Clearance: 66.7 mL/min (A) (by C-G formula based on SCr of 1.16 mg/dL (H)). Liver Function Tests: Recent Labs  Lab 09/21/23 0352 09/22/23 0359 09/23/23 0424  AST 55* 50* 40  ALT 23 23 21   ALKPHOS 79 73 75  BILITOT 1.1 1.2 0.9  PROT 8.0 7.4 7.6  ALBUMIN 3.6 3.3* 3.3*   No results for input(s): "LIPASE", "AMYLASE" in the last 168 hours. No results for input(s): "AMMONIA" in the last 168 hours. Coagulation Profile: No results for input(s): "INR", "PROTIME" in the last 168 hours. Cardiac Enzymes: No results for input(s): "CKTOTAL", "CKMB", "CKMBINDEX", "TROPONINI" in the last 168 hours. BNP (last 3 results) No results for input(s): "PROBNP" in the last 8760 hours. HbA1C: No results for input(s): "HGBA1C" in the last 72 hours. CBG: Recent Labs  Lab 09/26/23 1530 09/26/23 2051 09/27/23 0325 09/27/23 0728 09/27/23 1157  GLUCAP 235* 186* 178* 197* 232*   Lipid Profile: No results for input(s): "CHOL", "HDL", "LDLCALC", "TRIG", "CHOLHDL", "LDLDIRECT" in the last 72 hours. Thyroid Function Tests: Recent Labs    09/27/23 0348  TSH 4.951*  FREET4 1.11   Anemia Panel: Recent Labs    09/27/23 0347  FERRITIN 36  TIBC 531*  IRON 45   Sepsis Labs: No results for input(s): "PROCALCITON", "LATICACIDVEN" in the last 168 hours.  No results found for this or any previous visit (from the past 240 hours).       Radiology Studies: CARDIAC CATHETERIZATION Result Date: 09/26/2023 Right heart catheterization 09/26/2023: RA: 5 mmHg RV: 42/0 mmHg PA: 38/20 mmHg, mPAP 28 mmHg PCW: 13 mmHg AO sats: 87% PA sats: 55% CO: 5.8 L/min CI: 2.6 L/min/m2 Mild pulmonary hypertension, WHP Grp II  Normal filling pressures at rest Compensated HFpEF Persistent hypoxia in spute of normal filling pressures. Consider alternate etiology for hypoxia, such as OSA/OHS Elder Negus, MD            LOS: 17 days   Time spent= 35 mins    Miguel Rota, MD [facility]  If 7PM-7AM, please contact night-coverage  09/27/2023, 1:32 PM

## 2023-09-28 DIAGNOSIS — L03116 Cellulitis of left lower limb: Secondary | ICD-10-CM | POA: Diagnosis not present

## 2023-09-28 LAB — MAGNESIUM: Magnesium: 2.1 mg/dL (ref 1.7–2.4)

## 2023-09-28 LAB — BLOOD GAS, ARTERIAL
Acid-Base Excess: 18.6 mmol/L — ABNORMAL HIGH (ref 0.0–2.0)
Bicarbonate: 45.9 mmol/L — ABNORMAL HIGH (ref 20.0–28.0)
Delivery systems: POSITIVE
Drawn by: 51133
O2 Content: 5 L/min
O2 Saturation: 92.3 %
Patient temperature: 36.9
Pressure support: 8 cmH2O
pCO2 arterial: 63 mm[Hg] — ABNORMAL HIGH (ref 32–48)
pH, Arterial: 7.47 — ABNORMAL HIGH (ref 7.35–7.45)
pO2, Arterial: 68 mm[Hg] — ABNORMAL LOW (ref 83–108)

## 2023-09-28 LAB — BASIC METABOLIC PANEL
Anion gap: 12 (ref 5–15)
BUN: 23 mg/dL (ref 8–23)
CO2: 36 mmol/L — ABNORMAL HIGH (ref 22–32)
Calcium: 9.6 mg/dL (ref 8.9–10.3)
Chloride: 87 mmol/L — ABNORMAL LOW (ref 98–111)
Creatinine, Ser: 1.04 mg/dL — ABNORMAL HIGH (ref 0.44–1.00)
GFR, Estimated: >60 mL/min (ref >60)
Glucose, Bld: 165 mg/dL — ABNORMAL HIGH (ref 70–99)
Potassium: 3.7 mmol/L (ref 3.5–5.1)
Sodium: 135 mmol/L (ref 135–145)

## 2023-09-28 LAB — GLUCOSE, CAPILLARY
Glucose-Capillary: 170 mg/dL — ABNORMAL HIGH (ref 70–99)
Glucose-Capillary: 217 mg/dL — ABNORMAL HIGH (ref 70–99)
Glucose-Capillary: 240 mg/dL — ABNORMAL HIGH (ref 70–99)
Glucose-Capillary: 234 mg/dL — ABNORMAL HIGH (ref 70–99)
Glucose-Capillary: 196 mg/dL — ABNORMAL HIGH (ref 70–99)

## 2023-09-28 LAB — CBC
HCT: 43.8 % (ref 36.0–46.0)
Hemoglobin: 11.4 g/dL — ABNORMAL LOW (ref 12.0–15.0)
MCH: 20.1 pg — ABNORMAL LOW (ref 26.0–34.0)
MCHC: 26 g/dL — ABNORMAL LOW (ref 30.0–36.0)
MCV: 77.2 fL — ABNORMAL LOW (ref 80.0–100.0)
Platelets: 385 10*3/uL (ref 150–400)
RBC: 5.67 MIL/uL — ABNORMAL HIGH (ref 3.87–5.11)
RDW: 32.2 % — ABNORMAL HIGH (ref 11.5–15.5)
WBC: 6.6 10*3/uL (ref 4.0–10.5)
nRBC: 0 % (ref 0.0–0.2)

## 2023-09-28 NOTE — Progress Notes (Signed)
ABG sent to lab, lab made aware. 

## 2023-09-28 NOTE — Progress Notes (Signed)
Mobility Specialist - Progress Note  ( 4L [place]) Pre-mobility: 86 bpm HR, 93% SpO2 During mobility: 90 bpm HR, 94% SpO2   09/28/23 1030  Mobility  Activity Dangled on edge of bed  Level of Assistance Standby assist, set-up cues, supervision of patient - no hands on  Range of Motion/Exercises Active  Activity Response Tolerated well  Mobility visit 1 Mobility  Mobility Specialist Start Time (ACUTE ONLY) 0950  Mobility Specialist Stop Time (ACUTE ONLY) 1030  Mobility Specialist Time Calculation (min) (ACUTE ONLY) 40 min   Pt was found in bed and agreeable to mobilize. Pt able to sit EOB w/out complaints. At EOS returned to bed with all needs met. Call bell in reach.  Billey Chang Mobility Specialist

## 2023-09-28 NOTE — Progress Notes (Signed)
   09/28/23 2315  BiPAP/CPAP/SIPAP  Reason BIPAP/CPAP not in use Other(comment) (pt first stated she wanted to wear bipap. RT checked again to place her on bipap and she refused. she said she dont want it tonight. Machine remianed bedside. advised her to let RN know if she changes her mind. Pt understood.)  BiPAP/CPAP /SiPAP Vitals  Resp (!) 40  MEWS Score/Color  MEWS Score 3  MEWS Score Color Yellow

## 2023-09-28 NOTE — Progress Notes (Signed)
Pt awake, RT pulling pulse ox to download.

## 2023-09-28 NOTE — Plan of Care (Signed)
  Problem: Clinical Measurements: Goal: Signs and symptoms of infection will decrease Outcome: Progressing   Problem: Nutrition: Goal: Adequate nutrition will be maintained Outcome: Progressing   Problem: Coping: Goal: Level of anxiety will decrease Outcome: Progressing   Problem: Elimination: Goal: Will not experience complications related to urinary retention Outcome: Progressing   Problem: Pain Management: Goal: General experience of comfort will improve Outcome: Progressing   Problem: Safety: Goal: Ability to remain free from injury will improve Outcome: Progressing   Problem: Skin Integrity: Goal: Risk for impaired skin integrity will decrease Outcome: Progressing

## 2023-09-28 NOTE — Progress Notes (Addendum)
PROGRESS NOTE    [name]  GUY:[id number] DOB: [birth year] DOA: 09/10/2023 PCP: [name] Dory, DO    Brief Narrative:  63 y.o. female with medical history significant of morbid obesity, right AKA, irritable bowel syndrome, HTN, HLD, DM, RA, asthma who presented to ED at her PCPs recommendation with worsening cellulitis of right breast.   Of note, patient initially presented to ED on 09/03/2023 and was diagnosed with left lower extremity as well as cellulitis under right breast and was discharged home on cefadroxil.     Due to patient's worsening symptoms of the right breast and left lower extremity patient presented to [facility] emergency department on 12/30.   Upon this admission found to have sepsis secondary to right breast cellulitis completed 7 days of IV Rocephin on 1/6.   Hospital course was complicated by progressive hypoxia secondary to diastolic CHF, getting IV diuretics per cardiology.  Furthermore A-fib is being managed with help of cardiology team as well.   Assessment & Plan:  Principal Problem:   Cellulitis of left lower extremity Active Problems:   Acute on chronic diastolic CHF (congestive heart failure) (HCC)   Type 2 diabetes mellitus with hyperglycemia, with long-term current use of insulin (HCC)   CAD S/P percutaneous coronary angioplasty   Iron deficiency anemia   Mixed connective tissue disease (HCC)   Gastroesophageal reflux disease   Morbid obesity (HCC)   Sepsis due to cellulitis (HCC)   Paroxysmal atrial fibrillation (HCC)   Acute on chronic heart failure with preserved ejection fraction (HFpEF) (HCC)   Acute on chronic respiratory failure with hypoxia and hypercapnia (HCC)   Cellulitis of right breast   Open wound of plantar aspect of left foot   Open wound of genital labia   Open abdominal wall wound, initial encounter   Breast wound, left, initial encounter   Hypokalemia    Acute on chronic respiratory failure with  hypoxia and hypercapnia (HCC)  Pulmonary arterial hypertension on home O2, currently on 4L [place].  On room air easily desaturates down to less than 85% -Combination of diastolic CHF, PAH, OHS.  Initially underwent IV diuretics.  RHC performed on 1/15 showed mild group 2 PAH but overall now euvolemic.  ABG 5 AM: Normal pH with hypercarbia  Her condition rapidly deteriorates without ventilator. Removal of ventilator may cause serious harm to the patient and her cardiac/Pulm condition, exacerbations of her condition and hospitalization.  Bilevel/RAD has been tried and failed to maintain and stabilize ptn.  Bilevel cannot meet current volume requirements.  Patient requires frequent duration of ventilator support.  Intermittent use is sufficient  Acute on chronic diastolic CHF (congestive heart failure) (HCC) Diuresis as above, cardiology managing.  Appreciate their input.   Paroxysmal atrial fibrillation (HCC) On Cardizem and Eliquis.   Hypokalemia As needed repletion   Sepsis secondary to cellulitis of left lower extremity Cellulitis of right breast Completed course of antibiotic   Insulin-dependent diabetes mellitus type 2 with hyperglycemia Peripheral neuropathy Accu-Chek sliding scale.  Continue long-acting insulin.  Adjust as necessary Hemoglobin A1C 8.0% Continue gabapentin Diabetic Diet   CAD S/P percutaneous coronary angioplasty Patient being evaluated for PCSK9 inhibitor as outpatient Discontinued aspirin as patient is now on Eliquis for paroxysmal intrafibrillation.  Elevated TSH - TSH 4.9, free T4 normal Synthroid 200 mcg daily  Gastroesophageal reflux disease Continue Protonix   Iron deficiency anemia Hemoglobin stable.  Hemoglobin baseline around 10.0    Open wound of plantar aspect of left foot Continue  daily dressing changes   Open wound of genital labia Continue daily dressing changes   Open abdominal wall wound, initial encounter Continue daily dressing  changes   Breast wound, left, initial encounter Continue daily dressing changes   Foley catheter in place due to complicated chronic wounds Significant issues with Her noncompliance.   DVT prophylaxis: apixaban (ELIQUIS) tablet 5 mg     Code Status: Full Code Family Communication: Husband at bedside Status is: Inpatient Remains inpatient appropriate because: Will need placement  Subjective: Clearly patient has hypercarbia with normal ABG.  She would benefit from home NIV which she is agreeable to.  In terms of breathing she is feeling better today.  Examination:  General exam: Appears calm and comfortable, morbidly obese Respiratory system: Clear to auscultation. Respiratory effort normal. Cardiovascular system: S1 & S2 heard, RRR. No JVD, murmurs, rubs, gallops or clicks. No pedal edema. Gastrointestinal system: Abdomen is nondistended, soft and nontender. No organomegaly or masses felt. Normal bowel sounds heard. Central nervous system: Alert and oriented. No focal neurological deficits. Extremities: Symmetric 5 x 5 power. Skin: Right lower extremity wound which are dressed.  Right sided AKA Psychiatry: Judgement and insight appear normal. Mood & affect appropriate. Foley catheter in place               Diet Orders (From admission, onward)     Start     Ordered   09/26/23 1526  Diet Heart Room service appropriate? Yes; Fluid consistency: Thin  Diet effective now       Question Answer Comment  Room service appropriate? Yes   Fluid consistency: Thin      09/26/23 1526            Objective: Vitals:   09/28/23 0727 09/28/23 0730 09/28/23 1039 09/28/23 1340  BP:   (!) 113/59 127/64  Pulse:   79 90  Resp:   18 20  Temp:    98.8 F (37.1 C)  TempSrc:    Oral  SpO2: 94% 94%  100%  Weight:      Height:        Intake/Output Summary (Last 24 hours) at 09/28/2023 1418 Last data filed at 09/28/2023 1049 Gross per 24 hour  Intake 203 ml  Output 900 ml  Net  -697 ml   Filed Weights   09/26/23 0452 09/27/23 0329 09/28/23 0500  Weight: 129.1 kg 133.1 kg 128.7 kg    Scheduled Meds:  apixaban  5 mg Oral BID   arformoterol  15 mcg Nebulization BID   budesonide (PULMICORT) nebulizer solution  0.25 mg Nebulization BID   Chlorhexidine Gluconate Cloth  6 each Topical Daily   diltiazem  360 mg Oral Daily   ferrous sulfate  325 mg Oral BID WC   fluticasone  1 spray Each Nare Daily   gabapentin  400 mg Oral BID   gabapentin  800 mg Oral QHS   Gerhardt's butt cream   Topical BID   guaiFENesin  600 mg Oral BID   insulin aspart  0-20 Units Subcutaneous TID AC & HS   insulin aspart  10 Units Subcutaneous TID WC   insulin glargine-yfgn  30 Units Subcutaneous QHS   levothyroxine  200 mcg Oral QAC breakfast   lidocaine  2 patch Transdermal Daily   pantoprazole  40 mg Oral Daily   pramipexole  1 mg Oral TID   pregabalin  50 mg Oral TID   sodium chloride flush  3 mL Intravenous Q12H   spironolactone  50 mg Oral Daily   torsemide  40 mg Oral Daily   Continuous Infusions:  Nutritional status     Body mass index is 51.07 kg/m.  Data Reviewed:   CBC: Recent Labs  Lab 09/22/23 0359 09/24/23 1031 09/25/23 0347 09/26/23 0351 09/26/23 1325 09/27/23 0348 09/28/23 0415  WBC 7.9 8.3 7.9 7.5  --  7.8 6.6  NEUTROABS 5.2 5.0  --   --   --   --   --   HGB 10.1* 11.4* 10.7* 11.5* 15.6*  15.6* 11.9* 11.4*  HCT 39.9 43.8 41.9 45.4 46.0  46.0 46.7* 43.8  MCV 75.3* 77.0* 77.7* 78.7*  --  77.6* 77.2*  PLT 336 384 376 397  --  410* 385   Basic Metabolic Panel: Recent Labs  Lab 09/23/23 0424 09/24/23 1031 09/25/23 0344 09/25/23 0347 09/26/23 0351 09/26/23 1325 09/27/23 0348 09/28/23 0415  NA 136 131*  --  134* 137 137  137 134* 135  K 3.3* 3.3*  --  3.2* 4.0 4.0  4.1 3.5 3.7  CL 76* 74*  --  78* 84*  --  81* 87*  CO2 >45* 42*  --  43* 40*  --  38* 36*  GLUCOSE 128* 182*  --  141* 130*  --  154* 165*  BUN 25* 22  --  22 22  --  22 23   CREATININE 1.21* 1.15*  --  1.20* 1.23*  --  1.16* 1.04*  CALCIUM 8.9 8.7*  --  8.9 9.5  --  9.2 9.6  MG 2.2 2.1  --   --  2.2  --  2.2 2.1  PHOS  --   --  4.3  --  5.1*  --   --   --    GFR: Estimated Creatinine Clearance: 72.9 mL/min (A) (by C-G formula based on SCr of 1.04 mg/dL (H)). Liver Function Tests: Recent Labs  Lab 09/22/23 0359 09/23/23 0424  AST 50* 40  ALT 23 21  ALKPHOS 73 75  BILITOT 1.2 0.9  PROT 7.4 7.6  ALBUMIN 3.3* 3.3*   No results for input(s): "LIPASE", "AMYLASE" in the last 168 hours. No results for input(s): "AMMONIA" in the last 168 hours. Coagulation Profile: No results for input(s): "INR", "PROTIME" in the last 168 hours. Cardiac Enzymes: No results for input(s): "CKTOTAL", "CKMB", "CKMBINDEX", "TROPONINI" in the last 168 hours. BNP (last 3 results) No results for input(s): "PROBNP" in the last 8760 hours. HbA1C: No results for input(s): "HGBA1C" in the last 72 hours. CBG: Recent Labs  Lab 09/27/23 1756 09/27/23 2039 09/28/23 0414 09/28/23 0730 09/28/23 1134  GLUCAP 205* 275* 170* 217* 240*   Lipid Profile: No results for input(s): "CHOL", "HDL", "LDLCALC", "TRIG", "CHOLHDL", "LDLDIRECT" in the last 72 hours. Thyroid Function Tests: Recent Labs    09/27/23 0348  TSH 4.951*  FREET4 1.11   Anemia Panel: Recent Labs    09/27/23 0347  FERRITIN 36  TIBC 531*  IRON 45   Sepsis Labs: No results for input(s): "PROCALCITON", "LATICACIDVEN" in the last 168 hours.  No results found for this or any previous visit (from the past 240 hours).       Radiology Studies: No results found.          LOS: 18 days   Time spent= 35 mins    Miguel Rota, MD [facility]  If 7PM-7AM, please contact night-coverage  09/28/2023, 2:18 PM

## 2023-09-28 NOTE — TOC Progression Note (Signed)
Transition of Care [facility]) - Progression Note   Patient Details  Name: [name] MRN: [id number] Date of Birth: August 03, 1961  Transition of Care [facility]) CM/SW Contact  Ewing Schlein, LCSW Phone Number: 09/28/2023, 1:58 PM  Clinical Narrative: CSW provided Mitch with [employer] NIV prescription order that was signed by hospitalist. CSW updated patient regarding needing insurance approval before the NIV can be set up for home use. Patient verbalized understanding.  Expected Discharge Plan: Home w Home Health Services Barriers to Discharge: Continued Medical Work up  Expected Discharge Plan and Services In-house Referral: Clinical Social Work Post Acute Care Choice: Durable Medical Equipment Living arrangements for the past 2 months: Single Family Home           DME Arranged: NIV DME Agency: [employer] Date DME Agency Contacted: 09/27/23 Time DME Agency Contacted: 1315 Representative spoke with at DME Agency: Mitch  Social Determinants of Health (SDOH) Interventions SDOH Screenings   Food Insecurity: No Food Insecurity (09/11/2023)  Housing: High Risk (09/12/2023)  Transportation Needs: Unmet Transportation Needs (09/11/2023)  Utilities: Not At Risk (09/11/2023)  Alcohol Screen: Low Risk  (09/10/2023)  Depression (PHQ2-9): Low Risk  (07/10/2023)  Financial Resource Strain: Medium Risk (09/10/2023)  Physical Activity: Unknown (09/10/2023)  Social Connections: Socially Isolated (09/10/2023)  Stress: No Stress Concern Present (09/10/2023)  Tobacco Use: Medium Risk (09/10/2023)   Readmission Risk Interventions    09/27/2023    2:29 PM 12/23/2021    4:00 PM  Readmission Risk Prevention Plan  Transportation Screening Complete Complete  HRI or Home Care Consult Complete   Social Work Consult for Recovery Care Planning/Counseling Complete   Palliative Care Screening Not Applicable   Medication Review Oceanographer) Complete Complete  PCP or Specialist appointment within 3-5 days of  discharge  Complete  HRI or Home Care Consult  Complete  SW Recovery Care/Counseling Consult  Complete  Palliative Care Screening  Not Applicable  Skilled Nursing Facility  Not Applicable

## 2023-09-28 NOTE — Progress Notes (Signed)
Cpap on standby 

## 2023-09-28 NOTE — Progress Notes (Signed)
This nurse did wound care on left leg and did not see any weeping wound. Previous dressing was also dry.

## 2023-09-28 NOTE — Progress Notes (Signed)
Overnight pulse ox found off pt's finger, RT placed back on finger at this time.

## 2023-09-28 NOTE — Progress Notes (Signed)
Progress Note  Patient Name: [name] Date of Encounter: 09/28/2023  Primary Cardiologist: Armanda Magic, MD  Subjective   Feeling better. No acute complaints. Breathing doing well. Remains on O2. Wondering what dispo/DC plan is.  Inpatient Medications    Scheduled Meds:  apixaban  5 mg Oral BID   arformoterol  15 mcg Nebulization BID   budesonide (PULMICORT) nebulizer solution  0.25 mg Nebulization BID   Chlorhexidine Gluconate Cloth  6 each Topical Daily   diltiazem  360 mg Oral Daily   ferrous sulfate  325 mg Oral BID WC   fluticasone  1 spray Each Nare Daily   gabapentin  400 mg Oral BID   gabapentin  800 mg Oral QHS   Gerhardt's butt cream   Topical BID   guaiFENesin  600 mg Oral BID   insulin aspart  0-20 Units Subcutaneous TID AC & HS   insulin aspart  10 Units Subcutaneous TID WC   insulin glargine-yfgn  30 Units Subcutaneous QHS   levothyroxine  200 mcg Oral QAC breakfast   lidocaine  2 patch Transdermal Daily   pantoprazole  40 mg Oral Daily   pramipexole  1 mg Oral TID   pregabalin  50 mg Oral TID   sodium chloride flush  3 mL Intravenous Q12H   spironolactone  50 mg Oral Daily   torsemide  40 mg Oral Daily   Continuous Infusions:  PRN Meds: acetaminophen, albuterol, glucagon (human recombinant), hydrALAZINE, metoprolol tartrate, ondansetron (ZOFRAN) IV, oxyCODONE, promethazine, senna-docusate, sodium chloride, traZODone   Vital Signs    Vitals:   09/28/23 0541 09/28/23 0727 09/28/23 0730 09/28/23 1039  BP: 107/67   (!) 113/59  Pulse: (!) 59   79  Resp: 16   18  Temp: (!) 97.4 F (36.3 C)     TempSrc: Oral     SpO2: 99% 94% 94%   Weight:      Height:        Intake/Output Summary (Last 24 hours) at 09/28/2023 1217 Last data filed at 09/28/2023 1049 Gross per 24 hour  Intake 203 ml  Output 900 ml  Net -697 ml      09/28/2023    5:00 AM 09/27/2023    3:29 AM 09/26/2023    4:52 AM  Last 3 Weights  Weight (lbs) 283 lb 11.7 oz 293 lb 6.9 oz  284 lb 9.8 oz  Weight (kg) 128.7 kg 133.1 kg 129.1 kg     Telemetry    NSR - Personally Reviewed  Physical Exam   GEN: No acute distress.  HEENT: Normocephalic, atraumatic, sclera non-icteric. Neck: No JVD or bruits. Cardiac: RRR no murmurs, rubs, or gallops.  Respiratory: CTAB. Breathing is unlabored on [place] O2. GI: Soft, rounded obese pannus, BS +x 4. MS: no deformity. Extremities: No clubbing or cyanosis. S/p R AKA. LLE is erythematous and wrapped with edema noted though improved, dry skin. Neuro:  AAOx3. Follows commands. Psych:  Responds to questions appropriately with a normal affect.   Labs    High Sensitivity Troponin:   Recent Labs  Lab 09/10/23 1434  TROPONINIHS 17      Cardiac EnzymesNo results for input(s): "TROPONINI" in the last 168 hours. No results for input(s): "TROPIPOC" in the last 168 hours.   Chemistry Recent Labs  Lab 09/22/23 0359 09/23/23 0424 09/24/23 1031 09/26/23 0351 09/26/23 1325 09/27/23 0348 09/28/23 0415  NA 135 136   < > 137 137  137 134* 135  K 3.3* 3.3*   < >  4.0 4.0  4.1 3.5 3.7  CL 77* 76*   < > 84*  --  81* 87*  CO2 43* >45*   < > 40*  --  38* 36*  GLUCOSE 142* 128*   < > 130*  --  154* 165*  BUN 23 25*   < > 22  --  22 23  CREATININE 1.31* 1.21*   < > 1.23*  --  1.16* 1.04*  CALCIUM 8.6* 8.9   < > 9.5  --  9.2 9.6  PROT 7.4 7.6  --   --   --   --   --   ALBUMIN 3.3* 3.3*  --   --   --   --   --   AST 50* 40  --   --   --   --   --   ALT 23 21  --   --   --   --   --   ALKPHOS 73 75  --   --   --   --   --   BILITOT 1.2 0.9  --   --   --   --   --   GFRNONAA 46* 51*   < > 50*  --  53* >60  ANIONGAP 15 NOT CALCULATED   < > 13  --  15 12   < > = values in this interval not displayed.     Hematology Recent Labs  Lab 09/26/23 0351 09/26/23 1325 09/27/23 0348 09/28/23 0415  WBC 7.5  --  7.8 6.6  RBC 5.77*  --  6.02* 5.67*  HGB 11.5* 15.6*  15.6* 11.9* 11.4*  HCT 45.4 46.0  46.0 46.7* 43.8  MCV 78.7*  --  77.6*  77.2*  MCH 19.9*  --  19.8* 20.1*  MCHC 25.3*  --  25.5* 26.0*  RDW 32.7*  --  32.7* 32.2*  PLT 397  --  410* 385    BNPNo results for input(s): "BNP", "PROBNP" in the last 168 hours.   DDimer No results for input(s): "DDIMER" in the last 168 hours.   Radiology    CARDIAC CATHETERIZATION Result Date: 09/26/2023 Right heart catheterization 09/26/2023: RA: 5 mmHg RV: 42/0 mmHg PA: 38/20 mmHg, mPAP 28 mmHg PCW: 13 mmHg AO sats: 87% PA sats: 55% CO: 5.8 L/min CI: 2.6 L/min/m2 Mild pulmonary hypertension, WHP Grp II Normal filling pressures at rest Compensated HFpEF Persistent hypoxia in spute of normal filling pressures. Consider alternate etiology for hypoxia, such as OSA/OHS Elder Negus, MD    Cardiac Studies    2d echo 09/12/23   1. Left ventricular ejection fraction, by estimation, is 60 to 65%. The  left ventricle has normal function. Left ventricular endocardial border  not optimally defined to evaluate regional wall motion. Left ventricular  diastolic parameters are  indeterminate.   2. RV not well visualized, grossly appears normal in size and function. .  Right ventricular systolic function was not well visualized. The right  ventricular size is not well visualized. Tricuspid regurgitation signal is  inadequate for assessing PA  pressure.   3. The mitral valve was not well visualized. No evidence of mitral valve  regurgitation. No evidence of mitral stenosis.   4. The aortic valve was not well visualized. Aortic valve regurgitation  is not visualized. No aortic stenosis is present.   5. Technically difficult study, poor visualization.     Patient Profile      63 y.o. female with CAD  s/p DES to RCA 2017 with repeat DES to ostial RCA 2019, PAF in setting of sepsis/bacteremia, intolerance of BB in the past due to wheezing, uncontrolled DM, morbid obesity (prior suspected OSA/OHS), HTN, right AKA, hyperkalemia with ARB, medication noncompliance, chronic HFpEF admitted  with cellulitis, acute on chronic HFpEF and hypoxia.   Assessment & Plan    1. Acute on chronic diastolic heart failure - has been diuresed with IV lasix and spironolactone this admission, -31L, weight 347->283 today - echo this admission with preserved LVEF, not clear images of RV, but normal RV function on TEE in 2022 - additional issues include hyponatremia, hypokalemia, hypochloremia, hypercarbia - RHC 1/15 with normal filling pressures at rest suggesting compensated HFpEF and mild pHTN - avoid SGLT2 given wound issues - LLE cellulitis, R breast cellulitis, genital labia wound, abdominal wall wound, left breast wound - switched to torsemide 40mg  daily starting today, continue this plus spironolactone - recommend f/u BMET when she returns for f/u 1/23   2. Persistent hypoxia - RHC suggested compensated HF, therefore suspect residual process at play, possibly OHS/OSA. Recommend pulm f/u as OP.   3. PAF - in and out this admission per notes - Eliquis initiated, can be difficult to discern on tele at times with p wave amplitude - diltiazem titrated to 360mg  daily - avoid BB due to wheezing with this in the past - management of recent abnormal TSH per primary team (4.951) - appears NSR, rate controlled   4. CAD - prior PCI/DES to distal RCA and ostial RCA, no chest pain - ASA discontinued this admission given concomitant Eliquis - does not appear she followed up for PCSK9i, she preferred to avoid statins, can re-engage as outpatient - had MSK chest pain this admission, not felt to have ACS, improved   5. Microcytic anemia - per primary team   Remainder per primary  Cardiology f/u was already previously arranged with APP M. Swinyer 1/23, will keep  Await dispo per primary team; Dr. Rennis Golden to follow   For questions or updates, please contact [facility] Please consult [email or web address] for contact info under Cardiology/STEMI.  Signed, Laurann Montana, PA-C 09/28/2023, 12:17  PM

## 2023-09-29 DIAGNOSIS — L03116 Cellulitis of left lower limb: Secondary | ICD-10-CM | POA: Diagnosis not present

## 2023-09-29 LAB — MAGNESIUM: Magnesium: 2 mg/dL (ref 1.7–2.4)

## 2023-09-29 LAB — BASIC METABOLIC PANEL
Anion gap: 11 (ref 5–15)
BUN: 23 mg/dL (ref 8–23)
CO2: 38 mmol/L — ABNORMAL HIGH (ref 22–32)
Calcium: 9.5 mg/dL (ref 8.9–10.3)
Chloride: 86 mmol/L — ABNORMAL LOW (ref 98–111)
Creatinine, Ser: 1.23 mg/dL — ABNORMAL HIGH (ref 0.44–1.00)
GFR, Estimated: 50 mL/min — ABNORMAL LOW (ref >60)
Glucose, Bld: 240 mg/dL — ABNORMAL HIGH (ref 70–99)
Potassium: 3.4 mmol/L — ABNORMAL LOW (ref 3.5–5.1)
Sodium: 135 mmol/L (ref 135–145)

## 2023-09-29 LAB — CBC
HCT: 42.5 % (ref 36.0–46.0)
Hemoglobin: 11.3 g/dL — ABNORMAL LOW (ref 12.0–15.0)
MCH: 20.7 pg — ABNORMAL LOW (ref 26.0–34.0)
MCHC: 26.6 g/dL — ABNORMAL LOW (ref 30.0–36.0)
MCV: 77.7 fL — ABNORMAL LOW (ref 80.0–100.0)
Platelets: 350 10*3/uL (ref 150–400)
RBC: 5.47 MIL/uL — ABNORMAL HIGH (ref 3.87–5.11)
RDW: 31.9 % — ABNORMAL HIGH (ref 11.5–15.5)
WBC: 7.5 10*3/uL (ref 4.0–10.5)
nRBC: 0 % (ref 0.0–0.2)

## 2023-09-29 LAB — GLUCOSE, CAPILLARY
Glucose-Capillary: 255 mg/dL — ABNORMAL HIGH (ref 70–99)
Glucose-Capillary: 202 mg/dL — ABNORMAL HIGH (ref 70–99)
Glucose-Capillary: 331 mg/dL — ABNORMAL HIGH (ref 70–99)
Glucose-Capillary: 293 mg/dL — ABNORMAL HIGH (ref 70–99)
Glucose-Capillary: 306 mg/dL — ABNORMAL HIGH (ref 70–99)

## 2023-09-29 MED ORDER — HYDROCORTISONE 1 % EX CREA
TOPICAL_CREAM | Freq: Four times a day (QID) | CUTANEOUS | Status: DC | PRN
  Filled 2023-09-29: qty 28

## 2023-09-29 MED ORDER — POTASSIUM CHLORIDE CRYS ER 20 MEQ PO TBCR
40.0000 meq | EXTENDED_RELEASE_TABLET | Freq: Once | ORAL | Status: AC
  Administered 2023-09-29: 40 meq via ORAL
  Filled 2023-09-29: qty 2

## 2023-09-29 NOTE — Progress Notes (Signed)
   09/29/23 2221  BiPAP/CPAP/SIPAP  BiPAP/CPAP/SIPAP Pt Type Adult  BiPAP/CPAP/SIPAP DREAMSTATIOND  Mask Type Full face mask  Mask Size Medium  Respiratory Rate 20 breaths/min  Flow Rate 3 lpm  Patient Home Equipment No  Auto Titrate Yes (20/5)  CPAP/SIPAP surface wiped down Yes  BiPAP/CPAP /SiPAP Vitals  Pulse Rate 78  Resp 16  SpO2 97 %  Bilateral Breath Sounds Clear;Diminished  MEWS Score/Color  MEWS Score 0  MEWS Score Color [name]

## 2023-09-29 NOTE — TOC Progression Note (Signed)
Transition of Care [facility]) - Progression Note    Patient Details  Name: [name] MRN: [id number] Date of Birth: 1960-11-08  Transition [facility]) CM/SW Contact  Maryjean Ka, [place] Phone Number: 09/29/2023, 5:32 PM  Clinical Narrative:    This CSW met with patient at bedside and introduced self and role of this CSW. This CSW assisted patient with hardship paperwork for DME. CSW sent hardship paperwork to [employer] [email or web address]  This CSW awaiting follow on DME hardship paperwork.   Expected Discharge Plan: Home w Home Health Services Barriers to Discharge: Continued Medical Work up  Expected Discharge Plan and Services In-house Referral: Clinical Social Work   Post Acute Care Choice: Durable Medical Equipment Living arrangements for the past 2 months: Single Family Home                 DME Arranged: NIV DME Agency: [employer] Date DME Agency Contacted: 09/27/23 Time DME Agency Contacted: 1315 Representative spoke with at DME Agency: Mitch             Social Determinants of Health (SDOH) Interventions SDOH Screenings   Food Insecurity: No Food Insecurity (09/11/2023)  Housing: High Risk (09/12/2023)  Transportation Needs: Unmet Transportation Needs (09/11/2023)  Utilities: Not At Risk (09/11/2023)  Alcohol Screen: Low Risk  (09/10/2023)  Depression (PHQ2-9): Low Risk  (07/10/2023)  Financial Resource Strain: Medium Risk (09/10/2023)  Physical Activity: Unknown (09/10/2023)  Social Connections: Socially Isolated (09/10/2023)  Stress: No Stress Concern Present (09/10/2023)  Tobacco Use: Medium Risk (09/10/2023)    Readmission Risk Interventions    09/27/2023    2:29 PM 12/23/2021    4:00 PM  Readmission Risk Prevention Plan  Transportation Screening Complete Complete  HRI or Home Care Consult Complete   Social Work Consult for Recovery Care Planning/Counseling Complete   Palliative Care Screening Not Applicable   Medication Review Special educational needs teacher) Complete Complete  PCP or Specialist appointment within 3-5 days of discharge  Complete  HRI or Home Care Consult  Complete  SW Recovery Care/Counseling Consult  Complete  Palliative Care Screening  Not Applicable  Skilled Nursing Facility  Not Applicable

## 2023-09-29 NOTE — Progress Notes (Signed)
PROGRESS NOTE    [name]  WUX:[id number] DOB: 04/12/1961 DOA: 09/10/2023 PCP: Sharlene Dory, DO    Brief Narrative:  63 y.o. female with medical history significant of morbid obesity, right AKA, irritable bowel syndrome, HTN, HLD, DM, RA, asthma who presented to ED at her PCPs recommendation with worsening cellulitis of right breast.   Of note, patient initially presented to ED on 09/03/2023 and was diagnosed with left lower extremity as well as cellulitis under right breast and was discharged home on cefadroxil.     Due to patient's worsening symptoms of the right breast and left lower extremity patient presented to [facility] emergency department on 12/30.   Upon this admission found to have sepsis secondary to right breast cellulitis completed 7 days of IV Rocephin on 1/6.   Hospital course was complicated by progressive hypoxia secondary to diastolic CHF, getting IV diuretics per cardiology.  Furthermore A-fib is being managed with help of cardiology team as well.  Patient also underwent RHC which showed overall normal filling pressure but concerns of PAH.  It was difficult to wean her off oxygen despite of aggressive diuresis and medical management.  Eventually it was determined that patient would be beneficial for patient to have NIV therefore arrangements are being made by TOC.   Assessment & Plan:  Principal Problem:   Cellulitis of left lower extremity Active Problems:   Acute on chronic diastolic CHF (congestive heart failure) (HCC)   Type 2 diabetes mellitus with hyperglycemia, with long-term current use of insulin (HCC)   CAD S/P percutaneous coronary angioplasty   Iron deficiency anemia   Mixed connective tissue disease (HCC)   Gastroesophageal reflux disease   Morbid obesity (HCC)   Sepsis due to cellulitis (HCC)   Paroxysmal atrial fibrillation (HCC)   Acute on chronic heart failure with preserved ejection fraction (HFpEF) (HCC)   Acute on  chronic respiratory failure with hypoxia and hypercapnia (HCC)   Cellulitis of right breast   Open wound of plantar aspect of left foot   Open wound of genital labia   Open abdominal wall wound, initial encounter   Breast wound, left, initial encounter   Hypokalemia    Acute on chronic respiratory failure with hypoxia and hypercapnia (HCC)  Pulmonary arterial hypertension on home O2, currently on 4L [place].  On room air easily desaturates down to less than 85% -Combination of diastolic CHF, PAH, OHS.  Initially underwent IV diuretics.  RHC performed on 1/15 showed mild group 2 PAH but overall now euvolemic.  ABG 5 AM 1/17: Normal pH with hypercarbia  Her condition rapidly deteriorates without ventilator. Removal of ventilator may cause serious harm to the patient and her cardiac/Pulm condition, exacerbations of her condition and hospitalization.  Bilevel/RAD has been tried and failed to maintain and stabilize ptn.  Bilevel cannot meet current volume requirements.  Patient requires frequent duration of ventilator support.  Intermittent use is sufficient  Acute on chronic diastolic CHF (congestive heart failure) (HCC) Diuresis as above, cardiology managing.  Appreciate their input.   Paroxysmal atrial fibrillation (HCC) On Cardizem and Eliquis.   Hypokalemia As needed repletion   Sepsis secondary to cellulitis of left lower extremity Cellulitis of right breast Completed course of antibiotic   Insulin-dependent diabetes mellitus type 2 with hyperglycemia Peripheral neuropathy Accu-Chek sliding scale.  Continue long-acting insulin.  Adjust as necessary Hemoglobin A1C 8.0% Continue gabapentin Diabetic Diet   CAD S/P percutaneous coronary angioplasty Patient being evaluated for PCSK9 inhibitor as outpatient  Discontinued aspirin as patient is now on Eliquis for paroxysmal intrafibrillation.  Elevated TSH - TSH 4.9, free T4 normal Synthroid 200 mcg daily  Gastroesophageal reflux  disease Continue Protonix   Iron deficiency anemia Hemoglobin stable.  Hemoglobin baseline around 10.0    Open wound of plantar aspect of left foot Continue daily dressing changes   Open wound of genital labia Continue daily dressing changes   Open abdominal wall wound, initial encounter Continue daily dressing changes   Breast wound, left, initial encounter Continue daily dressing changes   Foley catheter in place due to complicated chronic wounds Significant issues with Her noncompliance.   DVT prophylaxis: apixaban (ELIQUIS) tablet 5 mg     Code Status: Full Code Family Communication: Husband at bedside Status is: Inpatient Remains inpatient appropriate because: Will need placement  Subjective: Feeling okay, no complaints.  Sitting the side of the bed  Examination:  General exam: Appears calm and comfortable, morbidly obese Respiratory system: Clear to auscultation. Respiratory effort normal. Cardiovascular system: S1 & S2 heard, RRR. No JVD, murmurs, rubs, gallops or clicks. No pedal edema. Gastrointestinal system: Abdomen is nondistended, soft and nontender. No organomegaly or masses felt. Normal bowel sounds heard. Central nervous system: Alert and oriented. No focal neurological deficits. Extremities: Symmetric 5 x 5 power. Skin: Right lower extremity wound which are dressed.  Right sided AKA Psychiatry: Judgement and insight appear normal. Mood & affect appropriate. Foley catheter in place               Diet Orders (From admission, onward)     Start     Ordered   09/26/23 1526  Diet Heart Room service appropriate? Yes; Fluid consistency: Thin  Diet effective now       Question Answer Comment  Room service appropriate? Yes   Fluid consistency: Thin      09/26/23 1526            Objective: Vitals:   09/29/23 0037 09/29/23 0338 09/29/23 0735 09/29/23 0738  BP:  (!) 109/50    Pulse:  73    Resp: 16 20 20 16   Temp:  (!) 97.4 F (36.3 C)     TempSrc:  Oral    SpO2:  96% 96% 96%  Weight:  134.7 kg    Height:        Intake/Output Summary (Last 24 hours) at 09/29/2023 1152 Last data filed at 09/29/2023 0400 Gross per 24 hour  Intake 340 ml  Output 1100 ml  Net -760 ml   Filed Weights   09/27/23 0329 09/28/23 0500 09/29/23 0338  Weight: 133.1 kg 128.7 kg 134.7 kg    Scheduled Meds:  apixaban  5 mg Oral BID   arformoterol  15 mcg Nebulization BID   budesonide (PULMICORT) nebulizer solution  0.25 mg Nebulization BID   Chlorhexidine Gluconate Cloth  6 each Topical Daily   diltiazem  360 mg Oral Daily   ferrous sulfate  325 mg Oral BID WC   fluticasone  1 spray Each Nare Daily   gabapentin  400 mg Oral BID   gabapentin  800 mg Oral QHS   Gerhardt's butt cream   Topical BID   guaiFENesin  600 mg Oral BID   insulin aspart  0-20 Units Subcutaneous TID AC & HS   insulin aspart  10 Units Subcutaneous TID WC   insulin glargine-yfgn  30 Units Subcutaneous QHS   levothyroxine  200 mcg Oral QAC breakfast   lidocaine  2 patch Transdermal  Daily   pantoprazole  40 mg Oral Daily   pramipexole  1 mg Oral TID   pregabalin  50 mg Oral TID   sodium chloride flush  3 mL Intravenous Q12H   spironolactone  50 mg Oral Daily   torsemide  40 mg Oral Daily   Continuous Infusions:  Nutritional status     Body mass index is 53.45 kg/m.  Data Reviewed:   CBC: Recent Labs  Lab 09/24/23 1031 09/25/23 0347 09/26/23 0351 09/26/23 1325 09/27/23 0348 09/28/23 0415 09/29/23 0430  WBC 8.3 7.9 7.5  --  7.8 6.6 7.5  NEUTROABS 5.0  --   --   --   --   --   --   HGB 11.4* 10.7* 11.5* 15.6*  15.6* 11.9* 11.4* 11.3*  HCT 43.8 41.9 45.4 46.0  46.0 46.7* 43.8 42.5  MCV 77.0* 77.7* 78.7*  --  77.6* 77.2* 77.7*  PLT 384 376 397  --  410* 385 350   Basic Metabolic Panel: Recent Labs  Lab 09/24/23 1031 09/25/23 0344 09/25/23 0347 09/26/23 0351 09/26/23 1325 09/27/23 0348 09/28/23 0415 09/29/23 0430  NA 131*  --  134* 137 137   137 134* 135 135  K 3.3*  --  3.2* 4.0 4.0  4.1 3.5 3.7 3.4*  CL 74*  --  78* 84*  --  81* 87* 86*  CO2 42*  --  43* 40*  --  38* 36* 38*  GLUCOSE 182*  --  141* 130*  --  154* 165* 240*  BUN 22  --  22 22  --  22 23 23   CREATININE 1.15*  --  1.20* 1.23*  --  1.16* 1.04* 1.23*  CALCIUM 8.7*  --  8.9 9.5  --  9.2 9.6 9.5  MG 2.1  --   --  2.2  --  2.2 2.1 2.0  PHOS  --  4.3  --  5.1*  --   --   --   --    GFR: Estimated Creatinine Clearance: 63.4 mL/min (A) (by C-G formula based on SCr of 1.23 mg/dL (H)). Liver Function Tests: Recent Labs  Lab 09/23/23 0424  AST 40  ALT 21  ALKPHOS 75  BILITOT 0.9  PROT 7.6  ALBUMIN 3.3*   No results for input(s): "LIPASE", "AMYLASE" in the last 168 hours. No results for input(s): "AMMONIA" in the last 168 hours. Coagulation Profile: No results for input(s): "INR", "PROTIME" in the last 168 hours. Cardiac Enzymes: No results for input(s): "CKTOTAL", "CKMB", "CKMBINDEX", "TROPONINI" in the last 168 hours. BNP (last 3 results) No results for input(s): "PROBNP" in the last 8760 hours. HbA1C: No results for input(s): "HGBA1C" in the last 72 hours. CBG: Recent Labs  Lab 09/28/23 1134 09/28/23 1703 09/28/23 2123 09/29/23 0334 09/29/23 0732  GLUCAP 240* 234* 196* 255* 202*   Lipid Profile: No results for input(s): "CHOL", "HDL", "LDLCALC", "TRIG", "CHOLHDL", "LDLDIRECT" in the last 72 hours. Thyroid Function Tests: Recent Labs    09/27/23 0348  TSH 4.951*  FREET4 1.11   Anemia Panel: Recent Labs    09/27/23 0347  FERRITIN 36  TIBC 531*  IRON 45   Sepsis Labs: No results for input(s): "PROCALCITON", "LATICACIDVEN" in the last 168 hours.  No results found for this or any previous visit (from the past 240 hours).       Radiology Studies: No results found.         LOS: 19 days   Time spent= 35  mins    Miguel Rota, MD [facility]  If 7PM-7AM, please contact night-coverage  09/29/2023, 11:52 AM

## 2023-09-29 NOTE — Progress Notes (Signed)
Mobility Specialist - Progress Note   09/29/23 1027  Mobility  Activity Dangled on edge of bed  Level of Assistance Standby assist, set-up cues, supervision of patient - no hands on  Assistive Device None  Range of Motion/Exercises Active  Activity Response Tolerated well  Mobility visit 1 Mobility  Mobility Specialist Start Time (ACUTE ONLY) 1000  Mobility Specialist Stop Time (ACUTE ONLY) 1027  Mobility Specialist Time Calculation (min) (ACUTE ONLY) 27 min   Pt was found in bed and agreeable to mobilize. Pt able to come to sit EOB x2. At EOS pt was left sitting EOB with RN notified. Bed alarm on and call bell in reach.  Billey Chang Mobility Specialist

## 2023-09-30 DIAGNOSIS — L03116 Cellulitis of left lower limb: Secondary | ICD-10-CM | POA: Diagnosis not present

## 2023-09-30 LAB — CBC
HCT: 44.6 % (ref 36.0–46.0)
Hemoglobin: 11.8 g/dL — ABNORMAL LOW (ref 12.0–15.0)
MCH: 20.7 pg — ABNORMAL LOW (ref 26.0–34.0)
MCHC: 26.5 g/dL — ABNORMAL LOW (ref 30.0–36.0)
MCV: 78.4 fL — ABNORMAL LOW (ref 80.0–100.0)
Platelets: 355 10*3/uL (ref 150–400)
RBC: 5.69 MIL/uL — ABNORMAL HIGH (ref 3.87–5.11)
RDW: 32 % — ABNORMAL HIGH (ref 11.5–15.5)
WBC: 7.4 10*3/uL (ref 4.0–10.5)
nRBC: 0 % (ref 0.0–0.2)

## 2023-09-30 LAB — BASIC METABOLIC PANEL
Anion gap: 15 (ref 5–15)
BUN: 24 mg/dL — ABNORMAL HIGH (ref 8–23)
CO2: 31 mmol/L (ref 22–32)
Calcium: 9.4 mg/dL (ref 8.9–10.3)
Chloride: 90 mmol/L — ABNORMAL LOW (ref 98–111)
Creatinine, Ser: 1.1 mg/dL — ABNORMAL HIGH (ref 0.44–1.00)
GFR, Estimated: 57 mL/min — ABNORMAL LOW (ref >60)
Glucose, Bld: 265 mg/dL — ABNORMAL HIGH (ref 70–99)
Potassium: 3.5 mmol/L (ref 3.5–5.1)
Sodium: 136 mmol/L (ref 135–145)

## 2023-09-30 LAB — GLUCOSE, CAPILLARY
Glucose-Capillary: 224 mg/dL — ABNORMAL HIGH (ref 70–99)
Glucose-Capillary: 230 mg/dL — ABNORMAL HIGH (ref 70–99)
Glucose-Capillary: 344 mg/dL — ABNORMAL HIGH (ref 70–99)
Glucose-Capillary: 325 mg/dL — ABNORMAL HIGH (ref 70–99)
Glucose-Capillary: 203 mg/dL — ABNORMAL HIGH (ref 70–99)

## 2023-09-30 LAB — MAGNESIUM: Magnesium: 1.9 mg/dL (ref 1.7–2.4)

## 2023-09-30 MED ORDER — INSULIN ASPART 100 UNIT/ML IJ SOLN
13.0000 [IU] | Freq: Three times a day (TID) | INTRAMUSCULAR | Status: DC
  Administered 2023-09-30 – 2023-10-01 (×4): 13 [IU] via SUBCUTANEOUS

## 2023-09-30 MED ORDER — INSULIN GLARGINE-YFGN 100 UNIT/ML ~~LOC~~ SOLN
33.0000 [IU] | Freq: Every day | SUBCUTANEOUS | Status: DC
  Administered 2023-09-30: 33 [IU] via SUBCUTANEOUS
  Filled 2023-09-30: qty 0.33

## 2023-09-30 NOTE — Progress Notes (Signed)
PROGRESS NOTE    [name]  XBJ:[id number] DOB: January 05, 1961 DOA: 09/10/2023 PCP: Sharlene Dory, DO    Brief Narrative:  63 y.o. female with medical history significant of morbid obesity, right AKA, irritable bowel syndrome, HTN, HLD, DM, RA, asthma who presented to ED at her PCPs recommendation with worsening cellulitis of right breast.   Of note, patient initially presented to ED on 09/03/2023 and was diagnosed with left lower extremity as well as cellulitis under right breast and was discharged home on cefadroxil.     Due to patient's worsening symptoms of the right breast and left lower extremity patient presented to [facility] emergency department on 12/30.   Upon this admission found to have sepsis secondary to right breast cellulitis completed 7 days of IV Rocephin on 1/6.   Hospital course was complicated by progressive hypoxia secondary to diastolic CHF, getting IV diuretics per cardiology.  Furthermore A-fib is being managed with help of cardiology team as well.  Patient also underwent RHC which showed overall normal filling pressure but concerns of PAH.  It was difficult to wean her off oxygen despite of aggressive diuresis and medical management.  Eventually it was determined that patient would be beneficial for patient to have NIV therefore arrangements are being made by TOC.   Assessment & Plan:  Principal Problem:   Cellulitis of left lower extremity Active Problems:   Acute on chronic diastolic CHF (congestive heart failure) (HCC)   Type 2 diabetes mellitus with hyperglycemia, with long-term current use of insulin (HCC)   CAD S/P percutaneous coronary angioplasty   Iron deficiency anemia   Mixed connective tissue disease (HCC)   Gastroesophageal reflux disease   Morbid obesity (HCC)   Sepsis due to cellulitis (HCC)   Paroxysmal atrial fibrillation (HCC)   Acute on chronic heart failure with preserved ejection fraction (HFpEF) (HCC)   Acute on  chronic respiratory failure with hypoxia and hypercapnia (HCC)   Cellulitis of right breast   Open wound of plantar aspect of left foot   Open wound of genital labia   Open abdominal wall wound, initial encounter   Breast wound, left, initial encounter   Hypokalemia    Acute on chronic respiratory failure with hypoxia and hypercapnia (HCC)  Pulmonary arterial hypertension on home O2, currently on 4L [place].  On room air easily desaturates down to less than 85% -Combination of diastolic CHF, PAH, OHS.  Initially underwent IV diuretics.  RHC performed on 1/15 showed mild group 2 PAH but overall now euvolemic.  ABG 5 AM 1/17: Normal pH with hypercarbia  Her condition rapidly deteriorates without ventilator. Removal of ventilator may cause serious harm to the patient and her cardiac/Pulm condition, exacerbations of her condition and hospitalization.  Bilevel/RAD has been tried and failed to maintain and stabilize ptn.  Bilevel cannot meet current volume requirements.  Patient requires frequent duration of ventilator support.  Intermittent use is sufficient  Acute on chronic diastolic CHF (congestive heart failure) (HCC) Diuresis as above, cardiology managing.  Appreciate their input.   Paroxysmal atrial fibrillation (HCC) On Cardizem and Eliquis.   Hypokalemia As needed repletion   Sepsis secondary to cellulitis of left lower extremity Cellulitis of right breast Completed course of antibiotic   Insulin-dependent diabetes mellitus type 2 with hyperglycemia; uncontrolled Peripheral neuropathy Accu-Chek sliding scale.  Continue long-acting insulin.  Adjust as necessary Hemoglobin A1C 8.0% Continue gabapentin Diabetic Diet   CAD S/P percutaneous coronary angioplasty Patient being evaluated for PCSK9 inhibitor as  outpatient Discontinued aspirin as patient is now on Eliquis for paroxysmal intrafibrillation.  Elevated TSH - TSH 4.9, free T4 normal Synthroid 200 mcg  daily  Gastroesophageal reflux disease Continue Protonix   Iron deficiency anemia Hemoglobin stable.  Hemoglobin baseline around 10.0    Open wound of plantar aspect of left foot Continue daily dressing changes   Open wound of genital labia Continue daily dressing changes   Open abdominal wall wound, initial encounter Continue daily dressing changes   Breast wound, left, initial encounter Continue daily dressing changes   Foley catheter in place due to complicated chronic wounds Significant issues with Her noncompliance.   DVT prophylaxis: apixaban (ELIQUIS) tablet 5 mg     Code Status: Full Code Family Communication: Husband at bedside Status is: Inpatient Remains inpatient appropriate because: Pending home equipments/NIV.   Subjective: Doing ok Waiting on Bilevel.   Examination:  General exam: Appears calm and comfortable, morbidly obese Respiratory system: Clear to auscultation. Respiratory effort normal. Cardiovascular system: S1 & S2 heard, RRR. No JVD, murmurs, rubs, gallops or clicks. No pedal edema. Gastrointestinal system: Abdomen is nondistended, soft and nontender. No organomegaly or masses felt. Normal bowel sounds heard. Central nervous system: Alert and oriented. No focal neurological deficits. Extremities: Symmetric 5 x 5 power. Skin: Right lower extremity wound which are dressed.  Right sided AKA Psychiatry: Judgement and insight appear normal. Mood & affect appropriate. Foley catheter in place               Diet Orders (From admission, onward)     Start     Ordered   09/26/23 1526  Diet Heart Room service appropriate? Yes; Fluid consistency: Thin  Diet effective now       Question Answer Comment  Room service appropriate? Yes   Fluid consistency: Thin      09/26/23 1526            Objective: Vitals:   09/30/23 0331 09/30/23 0807 09/30/23 0946 09/30/23 1000  BP: 103/60  130/67   Pulse: 76  (!) 104   Resp: 16  20   Temp:  98.4 F (36.9 C)     TempSrc: Oral     SpO2: 100% 97% 95% 94%  Weight: 132.4 kg  128.7 kg   Height:        Intake/Output Summary (Last 24 hours) at 09/30/2023 1136 Last data filed at 09/30/2023 0542 Gross per 24 hour  Intake 480 ml  Output 2150 ml  Net -1670 ml   Filed Weights   09/29/23 0338 09/30/23 0331 09/30/23 0946  Weight: 134.7 kg 132.4 kg 128.7 kg    Scheduled Meds:  apixaban  5 mg Oral BID   arformoterol  15 mcg Nebulization BID   budesonide (PULMICORT) nebulizer solution  0.25 mg Nebulization BID   Chlorhexidine Gluconate Cloth  6 each Topical Daily   diltiazem  360 mg Oral Daily   ferrous sulfate  325 mg Oral BID WC   fluticasone  1 spray Each Nare Daily   gabapentin  400 mg Oral BID   gabapentin  800 mg Oral QHS   Gerhardt's butt cream   Topical BID   guaiFENesin  600 mg Oral BID   insulin aspart  0-20 Units Subcutaneous TID AC & HS   insulin aspart  13 Units Subcutaneous TID WC   insulin glargine-yfgn  33 Units Subcutaneous QHS   levothyroxine  200 mcg Oral QAC breakfast   lidocaine  2 patch Transdermal Daily  pantoprazole  40 mg Oral Daily   pramipexole  1 mg Oral TID   pregabalin  50 mg Oral TID   sodium chloride flush  3 mL Intravenous Q12H   spironolactone  50 mg Oral Daily   torsemide  40 mg Oral Daily   Continuous Infusions:  Nutritional status     Body mass index is 51.07 kg/m.  Data Reviewed:   CBC: Recent Labs  Lab 09/24/23 1031 09/25/23 0347 09/26/23 0351 09/26/23 1325 09/27/23 0348 09/28/23 0415 09/29/23 0430 09/30/23 0349  WBC 8.3   < > 7.5  --  7.8 6.6 7.5 7.4  NEUTROABS 5.0  --   --   --   --   --   --   --   HGB 11.4*   < > 11.5* 15.6*  15.6* 11.9* 11.4* 11.3* 11.8*  HCT 43.8   < > 45.4 46.0  46.0 46.7* 43.8 42.5 44.6  MCV 77.0*   < > 78.7*  --  77.6* 77.2* 77.7* 78.4*  PLT 384   < > 397  --  410* 385 350 355   < > = values in this interval not displayed.   Basic Metabolic Panel: Recent Labs  Lab 09/25/23 0344  09/25/23 0347 09/26/23 0351 09/26/23 1325 09/27/23 0348 09/28/23 0415 09/29/23 0430 09/30/23 0349  NA  --    < > 137 137  137 134* 135 135 136  K  --    < > 4.0 4.0  4.1 3.5 3.7 3.4* 3.5  CL  --    < > 84*  --  81* 87* 86* 90*  CO2  --    < > 40*  --  38* 36* 38* 31  GLUCOSE  --    < > 130*  --  154* 165* 240* 265*  BUN  --    < > 22  --  22 23 23  24*  CREATININE  --    < > 1.23*  --  1.16* 1.04* 1.23* 1.10*  CALCIUM  --    < > 9.5  --  9.2 9.6 9.5 9.4  MG  --   --  2.2  --  2.2 2.1 2.0 1.9  PHOS 4.3  --  5.1*  --   --   --   --   --    < > = values in this interval not displayed.   GFR: Estimated Creatinine Clearance: 68.9 mL/min (A) (by C-G formula based on SCr of 1.1 mg/dL (H)). Liver Function Tests: No results for input(s): "AST", "ALT", "ALKPHOS", "BILITOT", "PROT", "ALBUMIN" in the last 168 hours. No results for input(s): "LIPASE", "AMYLASE" in the last 168 hours. No results for input(s): "AMMONIA" in the last 168 hours. Coagulation Profile: No results for input(s): "INR", "PROTIME" in the last 168 hours. Cardiac Enzymes: No results for input(s): "CKTOTAL", "CKMB", "CKMBINDEX", "TROPONINI" in the last 168 hours. BNP (last 3 results) No results for input(s): "PROBNP" in the last 8760 hours. HbA1C: No results for input(s): "HGBA1C" in the last 72 hours. CBG: Recent Labs  Lab 09/29/23 1150 09/29/23 1644 09/29/23 2038 09/30/23 0327 09/30/23 0804  GLUCAP 331* 293* 306* 224* 230*   Lipid Profile: No results for input(s): "CHOL", "HDL", "LDLCALC", "TRIG", "CHOLHDL", "LDLDIRECT" in the last 72 hours. Thyroid Function Tests: No results for input(s): "TSH", "T4TOTAL", "FREET4", "T3FREE", "THYROIDAB" in the last 72 hours. Anemia Panel: No results for input(s): "VITAMINB12", "FOLATE", "FERRITIN", "TIBC", "IRON", "RETICCTPCT" in the last 72 hours. Sepsis Labs:  No results for input(s): "PROCALCITON", "LATICACIDVEN" in the last 168 hours.  No results found for this or  any previous visit (from the past 240 hours).       Radiology Studies: No results found.         LOS: 20 days   Time spent= 35 mins    Miguel Rota, MD [facility]  If 7PM-7AM, please contact night-coverage  09/30/2023, 11:36 AM

## 2023-09-30 NOTE — Progress Notes (Signed)
There was a consult for placing a PIV access. Patient doesn't have IV meds, only prn IV meds which haven't use more than 5 days. Informed Dr. Nelson Chimes regarding this matter. Dr. Nelson Chimes is okay without a PIV access at this time. HS [employer]

## 2023-09-30 NOTE — Progress Notes (Signed)
   09/30/23 2312  BiPAP/CPAP/SIPAP  BiPAP/CPAP/SIPAP Pt Type Adult  BiPAP/CPAP/SIPAP DREAMSTATIOND  Mask Type Full face mask  Mask Size Medium  Respiratory Rate 20 breaths/min  Flow Rate 2 lpm  Patient Home Equipment No  Auto Titrate Yes (20/5)  CPAP/SIPAP surface wiped down Yes  BiPAP/CPAP /SiPAP Vitals  SpO2 96 %

## 2023-10-01 LAB — GLUCOSE, CAPILLARY
Glucose-Capillary: 184 mg/dL — ABNORMAL HIGH (ref 70–99)
Glucose-Capillary: 189 mg/dL — ABNORMAL HIGH (ref 70–99)
Glucose-Capillary: 322 mg/dL — ABNORMAL HIGH (ref 70–99)

## 2023-10-01 LAB — BASIC METABOLIC PANEL WITH GFR
Anion gap: 16 — ABNORMAL HIGH (ref 5–15)
BUN: 24 mg/dL — ABNORMAL HIGH (ref 8–23)
CO2: 29 mmol/L (ref 22–32)
Calcium: 9.1 mg/dL (ref 8.9–10.3)
Chloride: 92 mmol/L — ABNORMAL LOW (ref 98–111)
Creatinine, Ser: 1.09 mg/dL — ABNORMAL HIGH (ref 0.44–1.00)
GFR, Estimated: 57 mL/min — ABNORMAL LOW
Glucose, Bld: 177 mg/dL — ABNORMAL HIGH (ref 70–99)
Potassium: 3.5 mmol/L (ref 3.5–5.1)
Sodium: 137 mmol/L (ref 135–145)

## 2023-10-01 LAB — CBC
HCT: 44.2 % (ref 36.0–46.0)
Hemoglobin: 11.8 g/dL — ABNORMAL LOW (ref 12.0–15.0)
MCH: 21.1 pg — ABNORMAL LOW (ref 26.0–34.0)
MCHC: 26.7 g/dL — ABNORMAL LOW (ref 30.0–36.0)
MCV: 79.2 fL — ABNORMAL LOW (ref 80.0–100.0)
Platelets: 349 K/uL (ref 150–400)
RBC: 5.58 MIL/uL — ABNORMAL HIGH (ref 3.87–5.11)
WBC: 7.6 K/uL (ref 4.0–10.5)
nRBC: 0 % (ref 0.0–0.2)

## 2023-10-01 LAB — MAGNESIUM: Magnesium: 1.8 mg/dL (ref 1.7–2.4)

## 2023-10-01 MED ORDER — APIXABAN 5 MG PO TABS: 5.0000 mg | ORAL_TABLET | Freq: Two times a day (BID) | ORAL | 0 refills | Status: DC

## 2023-10-01 MED ORDER — INSULIN SYRINGES (DISPOSABLE) U-100 0.5 ML MISC: 100.0000 | Freq: Three times a day (TID) | 2 refills | Status: AC

## 2023-10-01 MED ORDER — TORSEMIDE 40 MG PO TABS: 40.0000 mg | ORAL_TABLET | Freq: Every day | ORAL | 0 refills | Status: DC

## 2023-10-01 MED ORDER — DILTIAZEM HCL ER COATED BEADS 360 MG PO CP24: 360.0000 mg | ORAL_CAPSULE | Freq: Every day | ORAL | 0 refills | Status: DC

## 2023-10-01 MED ORDER — SPIRONOLACTONE 50 MG PO TABS: 50.0000 mg | ORAL_TABLET | Freq: Every day | ORAL | 0 refills | Status: DC

## 2023-10-01 MED ORDER — FUROSEMIDE 40 MG PO TABS
40.0000 mg | ORAL_TABLET | Freq: Once | ORAL | Status: AC
Start: 2023-10-01 — End: 2023-10-01
  Administered 2023-10-01: 40 mg via ORAL
  Filled 2023-10-01: qty 1

## 2023-10-01 MED ORDER — FUROSEMIDE 10 MG/ML IJ SOLN: 20.0000 mg | Freq: Once | INTRAMUSCULAR | Status: DC

## 2023-10-01 MED ORDER — FERROUS SULFATE 325 (65 FE) MG PO TABS: 325.0000 mg | ORAL_TABLET | Freq: Two times a day (BID) | ORAL | 1 refills | Status: DC

## 2023-10-01 MED ORDER — INSULIN GLARGINE-YFGN 100 UNIT/ML ~~LOC~~ SOLN
38.0000 [IU] | Freq: Every day | SUBCUTANEOUS | Status: DC
  Filled 2023-10-01: qty 0.38

## 2023-10-01 MED ORDER — SENNOSIDES-DOCUSATE SODIUM 8.6-50 MG PO TABS: 2.0000 | ORAL_TABLET | Freq: Every evening | ORAL | 0 refills | Status: DC | PRN

## 2023-10-01 NOTE — Inpatient Diabetes Management (Signed)
Inpatient Diabetes Program Recommendations  AACE/ADA: New Consensus Statement on Inpatient Glycemic Control (2015)  Target Ranges:  Prepandial:   less than 140 mg/dL      Peak postprandial:   less than 180 mg/dL (1-2 hours)      Critically ill patients:  140 - 180 mg/dL    Latest Reference Range & Units 09/30/23 03:27 09/30/23 08:04 09/30/23 12:14 09/30/23 16:14 09/30/23 21:10  Glucose-Capillary 70 - 99 mg/dL 161 (H) 096 (H)  20 units Novolog @0940  344 (H)  15 units Novolog @1325  325 (H)  28 units Novolog  203 (H)  7 units Novolog  33 units Semglee  (H): Data is abnormally high  Latest Reference Range & Units 10/01/23 02:54 10/01/23 07:22  Glucose-Capillary 70 - 99 mg/dL 045 (H) 409 (H)  17 units Novolog @0849   (H): Data is abnormally high   Admit with: worsening cellulitis of right breast   History: DM  Home DM Meds: U500 Insulin 5 units TID with meals + SSI (3-10 units extra)        Metformin 1000 mg BID        Dexcom G7 CGM  Current Orders: Semglee 38 units at bedtime     Novolog Resistant Correction Scale/ SSI (0-20 units) TID AC + HS        Novolog 13 units TID with meals   Met w/ pt this afternoon.  Reviewed home DM meds.  Pt would like to continue her U500 Insulin at home and not switch to basal/bolus regimen like we have been giving her in the hospital.  She gives herself flat dose of 5 units of the U500 with meals and takes a little extra based on her CBG.  Uses insulin syringes at home (0.38ml).  Asked me for refill on syringes and I passed that request along to MD.  Has Dexcom G7 glucose sensors at home and plans to resume her sensor when she goes home.  Has new pt appt with Dr. [name] (ENDO) in March.     --Will follow patient during hospitalization--  Ambrose Finland RN, MSN, CDCES Diabetes Coordinator Inpatient Glycemic Control Team Team Pager: [phone] (8a-5p)

## 2023-10-01 NOTE — TOC Transition Note (Signed)
Transition of Care [facility]) - Discharge Note  Patient Details  Name: [name] MRN: [id number] Date of Birth: November 25, 1960  Transition of Care [facility]) CM/SW Contact:  Ewing Schlein, LCSW Phone Number: 10/01/2023, 2:38 PM  Clinical Narrative: Patient confirmed she still wants to discharge home with [facility] rather than going to SNF. Amy with [facility] notified of discharge. [employer] to set up NIV at home after discharge. Patient will need PTAR. Medical necessity form done; PTAR scheduled. Discharge packet completed. RN updated. TOC signing off.  Final next level of care: Home w Home Health Services Barriers to Discharge: Barriers Resolved  Patient Goals and CMS Choice Patient states their goals for this hospitalization and ongoing recovery are:: retrun home with home health CMS Medicare.gov Compare Post Acute Care list provided to:: Patient Choice offered to / list presented to : Patient  Discharge Placement Patient to be transferred to facility by: PTAR Patient and family notified of of transfer: 10/01/23  Discharge Plan and Services Additional resources added to the After Visit Summary for   In-house Referral: Clinical Social Work Post Acute Care Choice: Durable Medical Equipment          DME Arranged: NIV DME Agency: [employer] Date DME Agency Contacted: 09/27/23 Time DME Agency Contacted: [id number] Representative spoke with at DME Agency: Mitch HH Arranged: PT, OT, RN, Nurse's Aide HH Agency: [facility] Date [facility] Agency Contacted: 09/17/23 Representative spoke with at [facility] Agency: Amy  Social Drivers of Health (SDOH) Interventions SDOH Screenings   Food Insecurity: No Food Insecurity (09/11/2023)  Housing: High Risk (09/12/2023)  Transportation Needs: Unmet Transportation Needs (09/11/2023)  Utilities: Not At Risk (09/11/2023)  Alcohol Screen: Low Risk  (09/10/2023)  Depression (PHQ2-9): Low Risk  (07/10/2023)  Financial Resource Strain: Medium Risk (09/10/2023)  Physical  Activity: Unknown (09/10/2023)  Social Connections: Socially Isolated (09/10/2023)  Stress: No Stress Concern Present (09/10/2023)  Tobacco Use: Medium Risk (09/10/2023)   Readmission Risk Interventions    09/27/2023    2:29 PM 12/23/2021    4:00 PM  Readmission Risk Prevention Plan  Transportation Screening Complete Complete  HRI or Home Care Consult Complete   Social Work Consult for Recovery Care Planning/Counseling Complete   Palliative Care Screening Not Applicable   Medication Review Oceanographer) Complete Complete  PCP or Specialist appointment within 3-5 days of discharge  Complete  HRI or Home Care Consult  Complete  SW Recovery Care/Counseling Consult  Complete  Palliative Care Screening  Not Applicable  Skilled Nursing Facility  Not Applicable

## 2023-10-01 NOTE — Discharge Summary (Signed)
Physician Discharge Summary  [name] WUJ:[id number] DOB: 05-30-1961 DOA: 09/10/2023  PCP: Sharlene Dory, DO  Admit date: 09/10/2023 Discharge date: 10/01/2023  Admitted From: Home Disposition: Home  Recommendations for Outpatient Follow-up:  Follow up with PCP in 1-2 weeks Please obtain BMP/CBC in one week your next doctors visit.  Lasix is discontinued.  Now patient is on torsemide and Aldactone Cardizem increased Discontinue aspirin.  Now patient is Eliquis twice daily Iron supplements with bowel regimen prescribed Should follow-up outpatient with endocrinology   Discharge Condition: Stable CODE STATUS: Full code Diet recommendation: Heart healthy/diabetic  Brief/Interim Summary: Brief Narrative:  63 y.o. female with medical history significant of morbid obesity, right AKA, irritable bowel syndrome, HTN, HLD, DM, RA, asthma who presented to ED at her PCPs recommendation with worsening cellulitis of right breast.   Of note, patient initially presented to ED on 09/03/2023 and was diagnosed with left lower extremity as well as cellulitis under right breast and was discharged home on cefadroxil.     Due to patient's worsening symptoms of the right breast and left lower extremity patient presented to [facility] emergency department on 12/30.   Upon this admission found to have sepsis secondary to right breast cellulitis completed 7 days of IV Rocephin on 1/6.   Hospital course was complicated by progressive hypoxia secondary to diastolic CHF, getting IV diuretics per cardiology.  Furthermore A-fib is being managed with help of cardiology team as well.  Patient also underwent RHC which showed overall normal filling pressure but concerns of PAH.  It was difficult to wean her off oxygen despite of aggressive diuresis and medical management.  Eventually it was determined that patient would be beneficial for patient to have NIV therefore arrangements are being made  by TOC.  Foley catheter was placed due to complicated wound and the location of it.  Patient will need to follow-up outpatient urology if she is going to keep the Foley catheter in for periodic exchange.  She understands she is at a high risk for recurrent infection.   Assessment & Plan:  Principal Problem:   Cellulitis of left lower extremity Active Problems:   Acute on chronic diastolic CHF (congestive heart failure) (HCC)   Type 2 diabetes mellitus with hyperglycemia, with long-term current use of insulin (HCC)   CAD S/P percutaneous coronary angioplasty   Iron deficiency anemia   Mixed connective tissue disease (HCC)   Gastroesophageal reflux disease   Morbid obesity (HCC)   Sepsis due to cellulitis (HCC)   Paroxysmal atrial fibrillation (HCC)   Acute on chronic heart failure with preserved ejection fraction (HFpEF) (HCC)   Acute on chronic respiratory failure with hypoxia and hypercapnia (HCC)   Cellulitis of right breast   Open wound of plantar aspect of left foot   Open wound of genital labia   Open abdominal wall wound, initial encounter   Breast wound, left, initial encounter   Hypokalemia    Acute on chronic respiratory failure with hypoxia and hypercapnia (HCC)  Pulmonary arterial hypertension on home O2, currently on 4L [place].  On room air easily desaturates down to less than 85% -Combination of diastolic CHF, PAH, OHS.  Initially underwent IV diuretics.  RHC performed on 1/15 showed mild group 2 PAH but overall now euvolemic.  ABG 5 AM 1/17: Normal pH with hypercarbia  With the help of TOC and [facility] will be arranging for home ventilator/trilogy.  Acute on chronic diastolic CHF (congestive heart failure) (HCC) Seen by  cardiology team.  Currently patient's Cardizem has been changed, Aldactone and torsemide added.  Will discontinue Lasix.  Should follow-up outpatient.   Paroxysmal atrial fibrillation (HCC) On Cardizem and Eliquis.   Hypokalemia As needed  repletion   Sepsis secondary to cellulitis of left lower extremity Cellulitis of right breast Completed course of antibiotic   Insulin-dependent diabetes mellitus type 2 with hyperglycemia; uncontrolled Peripheral neuropathy Patient's hemoglobin A1c is 8.0.  Seen by diabetic coordinator.  Patient will be returning home on her home insulin prior to admission.  Would benefit from outpatient follow-up.   CAD S/P percutaneous coronary angioplasty Patient being evaluated for PCSK9 inhibitor as outpatient Discontinued aspirin as patient is now on Eliquis for paroxysmal intrafibrillation.  Elevated TSH - TSH 4.9, free T4 normal Synthroid 200 mcg daily  Gastroesophageal reflux disease Continue Protonix   Iron deficiency anemia Hemoglobin stable.  Hemoglobin baseline around 10.0    Open wound of plantar aspect of left foot Continue daily dressing changes   Open wound of genital labia Continue daily dressing changes   Open abdominal wall wound, initial encounter Continue daily dressing changes   Breast wound, left, initial encounter Continue daily dressing changes   Foley catheter in place due to complicated chronic wounds Significant issues with Her noncompliance.   DVT prophylaxis: apixaban (ELIQUIS) tablet 5 mg     Code Status: Full Code Family Communication: Husband at bedside Status is: Inpatient Remains inpatient appropriate because: Pending home equipments/NIV.   Subjective: Seen and examined at bedside, no complaints  Examination:  General exam: Appears calm and comfortable, morbidly obese Respiratory system: Clear to auscultation. Respiratory effort normal. Cardiovascular system: S1 & S2 heard, RRR. No JVD, murmurs, rubs, gallops or clicks. No pedal edema. Gastrointestinal system: Abdomen is nondistended, soft and nontender. No organomegaly or masses felt. Normal bowel sounds heard. Central nervous system: Alert and oriented. No focal neurological  deficits. Extremities: Symmetric 5 x 5 power. Skin: Right lower extremity wound which are dressed.  Right sided AKA Psychiatry: Judgement and insight appear normal. Mood & affect appropriate. Foley catheter in place   Discharge Diagnoses:  Principal Problem:   Cellulitis of left lower extremity Active Problems:   Acute on chronic diastolic CHF (congestive heart failure) (HCC)   Type 2 diabetes mellitus with hyperglycemia, with long-term current use of insulin (HCC)   CAD S/P percutaneous coronary angioplasty   Iron deficiency anemia   Mixed connective tissue disease (HCC)   Gastroesophageal reflux disease   Morbid obesity (HCC)   Sepsis due to cellulitis (HCC)   Paroxysmal atrial fibrillation (HCC)   Acute on chronic heart failure with preserved ejection fraction (HFpEF) (HCC)   Acute on chronic respiratory failure with hypoxia and hypercapnia (HCC)   Cellulitis of right breast   Open wound of plantar aspect of left foot   Open wound of genital labia   Open abdominal wall wound, initial encounter   Breast wound, left, initial encounter   Hypokalemia      Discharge Exam: Vitals:   10/01/23 0930 10/01/23 1151  BP:  132/74  Pulse:  94  Resp:  20  Temp:  98.3 F (36.8 C)  SpO2: 93% 94%   Vitals:   10/01/23 0256 10/01/23 0349 10/01/23 0930 10/01/23 1151  BP: 120/62   132/74  Pulse: 76   94  Resp: 20   20  Temp: 98.5 F (36.9 C)   98.3 F (36.8 C)  TempSrc:    Oral  SpO2: 93%  93% 94%  Weight:  130.6 kg    Height:          Discharge Instructions   Allergies as of 10/01/2023       Reactions   Fish Allergy Anaphylaxis   INCLUDES OMEGA 3 OILS   Fish Oil Anaphylaxis, Swelling   THROAT SWELLS INCLUDES FISH AS A CLASS   Gabapentin Anxiety, Other (See Comments)   Anxiety on high doses, Angioedema   Ipratropium-albuterol Shortness Of Breath    Bronchospasm, Wheezing becomes worse, can take albuterol with ipratropium   Omega-3 Fatty Acids Swelling   Throat  swelling, Has tolerated Glucerna nutritional drinks   Peanut-containing Drug Products Shortness Of Breath   Victoza [liraglutide] Nausea And Vomiting   Patient reported severe, debilitating nausea   Lovastatin Other (See Comments)   EXTREME JOINT PAIN   Metronidazole Nausea And Vomiting, Swelling   Headache and shakes, Pt tolerating IV 06/14/22   Penicillins Rash, Other (See Comments)   UNSPECIFIED, TOLERATES KEFLEX, CHILDHOOD RX, PCN CHALLENGE PER ALLERGIST, 02/29/16.  RESULTS NOT UPDATED.   Red Yeast Rice [cholestin] Other (See Comments)   Muscle pain and severe joint pain.    Rosuvastatin Other (See Comments)   Extreme joint pain, to this & other statins   Rotigotine Swelling   (Neupro) Extreme edema   Atrovent Hfa [ipratropium Bromide Hfa] Other (See Comments)   wheezing   Iodine Swelling   Pt specifies allergen was Povidone-iodine 10 % swab. The reaction was localized to region (BLE) where topical iodine was being applied.    Ipratropium Bromide Other (See Comments)   Wheezing becomes worse   Morphine    Other Other (See Comments)   Surgical Staples causes redness/infection per patient.   Pepto-bismol [bismuth Subsalicylate] Nausea And Vomiting   Extreme vomiting   Wound Dressing Adhesive Other (See Comments)   Electrodes causes skin breakdown, Electrodes causes skin breakdown   Enalapril Cough   Metoprolol Palpitations   Suprep [na Sulfate-k Sulfate-mg Sulf] Nausea And Vomiting   Tape Other (See Comments)   Electrodes causes skin breakdown        Medication List     STOP taking these medications    aspirin EC 81 MG tablet   cefadroxil 500 MG capsule Commonly known as: DURICEF   Diclofenac Sodium 3 % Gel   Dilt-XR 240 MG 24 hr capsule Generic drug: diltiazem Replaced by: diltiazem 360 MG 24 hr capsule   fluconazole 150 MG tablet Commonly known as: DIFLUCAN   furosemide 40 MG tablet Commonly known as: LASIX   nystatin powder Commonly known as:  MYCOSTATIN/NYSTOP       TAKE these medications    acetaminophen 500 MG tablet Commonly known as: TYLENOL Take 1,000 mg by mouth as needed for mild pain (pain score 1-3).   albuterol (2.5 MG/3ML) 0.083% nebulizer solution Commonly known as: PROVENTIL USE 1 VIAL IN NEBULIZER EVERY 4 HOURS AS NEEDED FOR WHEEZING FOR SHORTNESS OF BREATH What changed: Another medication with the same name was changed. Make sure you understand how and when to take each.   albuterol 108 (90 Base) MCG/ACT inhaler Commonly known as: VENTOLIN HFA INHALE 2 PUFFS BY MOUTH EVERY 6 HOURS AS NEEDED FOR WHEEZING FOR SHORTNESS OF BREATH What changed: See the new instructions.   apixaban 5 MG Tabs tablet Commonly known as: ELIQUIS Take 1 tablet (5 mg total) by mouth 2 (two) times daily.   augmented betamethasone dipropionate 0.05 % cream Commonly known as: Diprolene AF Apply 1 Application topically 2 (two)  times daily as needed (rash).   Dexcom G7 Sensor Misc USE TO CHECK FOR BLOOD SUGAR CONTINUOUSLY- CHANGE SENSOR EVERY 10 DAYS   diclofenac Sodium 1 % Gel Commonly known as: VOLTAREN Apply 2 g topically 4 (four) times daily. What changed:  how much to take when to take this reasons to take this   diclofenac Sodium 2 % Soln Commonly known as: PENNSAID APPLY 2 PUMPS (40MG  TOTAL) TOPICALLY TWICE DAILY AS NEEDED What changed: See the new instructions.   diltiazem 360 MG 24 hr capsule Commonly known as: CARDIZEM CD Take 1 capsule (360 mg total) by mouth daily. Start taking on: October 02, 2023 Replaces: Dilt-XR 240 MG 24 hr capsule   diphenhydrAMINE 25 mg in sodium chloride 0.9 % 50 mL Inject 25 mg into the vein as needed (allergy).   EPINEPHrine 0.3 mg/0.3 mL Soaj injection Commonly known as: EPI-PEN Inject 0.3 mg into the muscle as needed for anaphylaxis.   ferrous sulfate 325 (65 FE) MG tablet Take 1 tablet (325 mg total) by mouth 2 (two) times daily with a meal.   fluticasone 50 MCG/ACT  nasal spray Commonly known as: FLONASE USE 2 SPRAY(S) IN EACH NOSTRIL ONCE DAILY AS NEEDED FOR ALLERGIES OR  RHINITIS What changed: See the new instructions.   gabapentin 400 MG capsule Commonly known as: NEURONTIN Take 1 capsule (400 mg total) by mouth 4 (four) times daily. TAKE 1 CAPSULE BY MOUTH IN THE MORNING AND 1 AT NOON AND 2 AT BEDTIME Strength: 400 mg What changed:  how much to take when to take this additional instructions   gentamicin ointment 0.1 % Commonly known as: GARAMYCIN Apply 1 Application topically in the morning and at bedtime.   Glucagon Emergency 1 MG Kit Inject 1 mg into the vein daily as needed.   guaiFENesin 600 MG 12 hr tablet Commonly known as: MUCINEX Take 600 mg by mouth 2 (two) times daily.   HUMULIN R 500 UNIT/ML injection Generic drug: insulin regular human CONCENTRATED INJECT 0.08-0.3 MLS (40-150 UNITS) INTO THE SKIN THREE TIMES DAILY WITH MEALS What changed: See the new instructions.   Hydrocortisone Max St 1 % Generic drug: hydrocortisone cream Apply 1 application topically 3 (three) times daily as needed for itching (minor skin irritation). What changed: when to take this   INSULIN SYRINGE 1CC/29G 29G X 1/2" 1 ML Misc Commonly known as: B-D INSULIN SYRINGE Use three times daily with insulin.  DX E11.9   isosorbide mononitrate 60 MG 24 hr tablet Commonly known as: IMDUR Take 1 tablet (60 mg total) by mouth daily.   levocetirizine 5 MG tablet Commonly known as: XYZAL TAKE 1 TABLET BY MOUTH ONCE DAILY IN THE EVENING   levothyroxine 200 MCG tablet Commonly known as: SYNTHROID TAKE 1 TABLET BY MOUTH ONCE DAILY BEFORE BREAKFAST   lidocaine 5 % ointment Commonly known as: XYLOCAINE Apply 1 Application topically in the morning and at bedtime. What changed: Another medication with the same name was changed. Make sure you understand how and when to take each.   lidocaine 5 % Commonly known as: LIDODERM Place 1 patch onto the skin  every 12 (twelve) hours. Remove & Discard patch within 12 hours or as directed What changed:  when to take this reasons to take this   metFORMIN 500 MG 24 hr tablet Commonly known as: GLUCOPHAGE-XR Take 2 tablets by mouth twice daily   mupirocin ointment 2 % Commonly known as: BACTROBAN Apply topically 2 (two) times daily. What changed: how  much to take   naloxone 4 MG/0.1ML Liqd nasal spray kit Commonly known as: NARCAN Place 1 spray into the nose as needed (opoid overdose).   naproxen sodium 220 MG tablet Commonly known as: ALEVE Take 220 mg by mouth 2 (two) times daily as needed (pain).   nitroGLYCERIN 0.4 MG SL tablet Commonly known as: NITROSTAT DISSOLVE ONE TABLET UNDER THE TONGUE EVERY 5 MINUTES AS NEEDED FOR CHEST PAIN.  DO NOT EXCEED A TOTAL OF 3 DOSES IN 15 MINUTES   omeprazole 40 MG capsule Commonly known as: PRILOSEC Take 1 capsule by mouth once daily   OneTouch Delica Lancets 33G Misc Use to check blood glucose up to 4 times a day   OneTouch Verio test strip Generic drug: glucose blood Use as instructed   Oxycodone HCl 20 MG Tabs Take 1 tablet (20 mg total) by mouth every 8 (eight) hours as needed.   oxymetazoline 0.05 % nasal spray Commonly known as: AFRIN Place 2 sprays into both nostrils 2 (two) times daily as needed for congestion.   pramipexole 1 MG tablet Commonly known as: MIRAPEX Take 1 tablet (1 mg total) by mouth 3 (three) times daily.   pregabalin 50 MG capsule Commonly known as: LYRICA Take 1 capsule (50 mg total) by mouth 3 (three) times daily.   promethazine 25 MG tablet Commonly known as: PHENERGAN Take 1 tablet (25 mg total) by mouth every 8 (eight) hours as needed for vomiting or nausea. What changed: when to take this   senna-docusate 8.6-50 MG tablet Commonly known as: Senokot-S Take 2 tablets by mouth at bedtime as needed for moderate constipation.   spironolactone 50 MG tablet Commonly known as: ALDACTONE Take 1 tablet  (50 mg total) by mouth daily. Start taking on: October 02, 2023   Systane Preservative Free 0.4-0.3 % Soln Generic drug: Polyethyl Glyc-Propyl Glyc PF Place 2 drops into both eyes in the morning and at bedtime.   Torsemide 40 MG Tabs Take 40 mg by mouth daily. Start taking on: October 02, 2023   Vitamin D (Ergocalciferol) 1.25 MG (50000 UNIT) Caps capsule Commonly known as: DRISDOL Take 1 capsule (50,000 Units total) by mouth once a week.   WOMENS 50+ MULTI VITAMIN PO Take 1 tablet by mouth in the morning and at bedtime.        Follow-up Information     [facility] Follow up.   Why: Your home health services have been set with [facility]. The office will call you with start of service information once you are discharged. You will be responsible for a 25% copay ansd your insurance will cover 75% Contact information: [address] [place] [address] [phone]         Swinyer, Zachary George, NP Follow up.   Specialty: Cardiology Why: Humberto Seals - [facility] location - cardiology follow-up Thursday Oct 04, 2023 10:30 AM (Arrive by 10:15 AM). Contact information: [address] [place] [address] [phone]         Sharlene Dory, DO Follow up.   Specialty: Family Medicine Contact information: [address] [place] [address] [phone]                Allergies  Allergen Reactions   Fish Allergy Anaphylaxis    INCLUDES OMEGA 3 OILS   Fish Oil Anaphylaxis and Swelling    THROAT SWELLS INCLUDES FISH AS A CLASS   Gabapentin Anxiety  and Other (See Comments)    Anxiety on high doses, Angioedema   Ipratropium-Albuterol Shortness Of Breath     Bronchospasm, Wheezing becomes worse, can take albuterol with ipratropium   Omega-3 Fatty Acids Swelling    Throat swelling, Has tolerated Glucerna nutritional drinks     Peanut-Containing Drug Products Shortness Of Breath   Victoza  [Liraglutide] Nausea And Vomiting    Patient reported severe, debilitating nausea   Lovastatin Other (See Comments)    EXTREME JOINT PAIN   Metronidazole Nausea And Vomiting and Swelling    Headache and shakes, Pt tolerating IV 06/14/22    Penicillins Rash and Other (See Comments)    UNSPECIFIED, TOLERATES KEFLEX, CHILDHOOD RX, PCN CHALLENGE PER ALLERGIST, 02/29/16.  RESULTS NOT UPDATED.    Red Yeast Rice [Cholestin] Other (See Comments)    Muscle pain and severe joint pain.    Rosuvastatin Other (See Comments)    Extreme joint pain, to this & other statins    Rotigotine Swelling    (Neupro) Extreme edema    Atrovent Hfa [Ipratropium Bromide Hfa] Other (See Comments)    wheezing   Iodine Swelling    Pt specifies allergen was Povidone-iodine 10 % swab. The reaction was localized to region (BLE) where topical iodine was being applied.    Ipratropium Bromide Other (See Comments)    Wheezing becomes worse   Morphine    Other Other (See Comments)    Surgical Staples causes redness/infection per patient.   Pepto-Bismol [Bismuth Subsalicylate] Nausea And Vomiting    Extreme vomiting   Wound Dressing Adhesive Other (See Comments)    Electrodes causes skin breakdown, Electrodes causes skin breakdown   Enalapril Cough   Metoprolol Palpitations   Suprep [Na Sulfate-K Sulfate-Mg Sulf] Nausea And Vomiting   Tape Other (See Comments)    Electrodes causes skin breakdown    You were cared for by a hospitalist during your hospital stay. If you have any questions about your discharge medications or the care you received while you were in the hospital after you are discharged, you can call the unit and asked to speak with the hospitalist on call if the hospitalist that took care of you is not available. Once you are discharged, your primary care physician will handle any further medical issues. Please note that no refills for any discharge medications will be authorized once you are discharged, as  it is imperative that you return to your primary care physician (or establish a relationship with a primary care physician if you do not have one) for your aftercare needs so that they can reassess your need for medications and monitor your lab values.  You were cared for by a hospitalist during your hospital stay. If you have any questions about your discharge medications or the care you received while you were in the hospital after you are discharged, you can call the unit and asked to speak with the hospitalist on call if the hospitalist that took care of you is not available. Once you are discharged, your primary care physician will handle any further medical issues. Please note that NO REFILLS for any discharge medications will be authorized once you are discharged, as it is imperative that you return to your primary care physician (or establish a relationship with a primary care physician if you do not have one) for your aftercare needs so that they can reassess your need for medications and monitor your lab values.  Please request your Prim.MD to go over all  Hospital Tests and Procedure/Radiological results at the follow up, please get all Hospital records sent to your Prim MD by signing hospital release before you go home.  Get CBC, CMP, 2 view Chest X ray checked  by Primary MD during your next visit or SNF MD in 5-7 days ( we routinely change or add medications that can affect your baseline labs and fluid status, therefore we recommend that you get the mentioned basic workup next visit with your PCP, your PCP may decide not to get them or add new tests based on their clinical decision)  On your next visit with your primary care physician please Get Medicines reviewed and adjusted.  If you experience worsening of your admission symptoms, develop shortness of breath, life threatening emergency, suicidal or homicidal thoughts you must seek medical attention immediately by calling 911 or calling your  MD immediately  if symptoms less severe.  You Must read complete instructions/literature along with all the possible adverse reactions/side effects for all the Medicines you take and that have been prescribed to you. Take any new Medicines after you have completely understood and accpet all the possible adverse reactions/side effects.   Do not drive, operate heavy machinery, perform activities at heights, swimming or participation in water activities or provide baby sitting services if your were admitted for syncope or siezures until you have seen by Primary MD or a Neurologist and advised to do so again.  Do not drive when taking Pain medications.   Procedures/Studies: CARDIAC CATHETERIZATION Result Date: 09/26/2023 Right heart catheterization 09/26/2023: RA: 5 mmHg RV: 42/0 mmHg PA: 38/20 mmHg, mPAP 28 mmHg PCW: 13 mmHg AO sats: 87% PA sats: 55% CO: 5.8 L/min CI: 2.6 L/min/m2 Mild pulmonary hypertension, WHP Grp II Normal filling pressures at rest Compensated HFpEF Persistent hypoxia in spute of normal filling pressures. Consider alternate etiology for hypoxia, such as OSA/OHS Elder Negus, MD   DG CHEST PORT 1 VIEW Result Date: 09/17/2023 CLINICAL DATA:  Respiratory failure EXAM: PORTABLE CHEST 1 VIEW COMPARISON:  09/11/2023 FINDINGS: Cardiomegaly with vascular congestion, slightly improved. Suspect small right-sided effusion. Increased airspace disease at the right base. No pneumothorax. IMPRESSION: 1. Cardiomegaly with vascular congestion, slightly improved. 2. Increased airspace disease at the right base, atelectasis versus pneumonia. Suspect small right-sided effusion. Electronically Signed   By: Jasmine Pang M.D.   On: 09/17/2023 16:58   DG Hand 2 View Right Result Date: 09/16/2023 CLINICAL DATA:  Wrist pain.  Ongoing pain to base of thumb. EXAM: RIGHT HAND - 2 VIEW COMPARISON:  02/23/16. FINDINGS: No signs of acute fracture or dislocation. Scattered D IP joint osteoarthritis noted which  is most severe at the second DIP joint where there is radial deviation of the distal phalanx. Mild degenerative changes identified at the basilar joint. Soft tissues are unremarkable. IMPRESSION: 1. No acute findings. 2. 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 3. Basilar joint osteoarthritis. Electronically Signed   By: Signa Kell M.D.   On: 09/16/2023 17:53   CT Angio Chest Pulmonary Embolism (PE) W or WO Contrast Result Date: 09/14/2023 CLINICAL DATA:  Shortness of breath and wheezing cellulitis under right breast elevated D-dimer EXAM: CT ANGIOGRAPHY CHEST WITH CONTRAST TECHNIQUE: Multidetector CT imaging of the chest was performed using the standard protocol during bolus administration of intravenous contrast. Multiplanar CT image reconstructions and MIPs were obtained to evaluate the vascular anatomy. RADIATION DOSE REDUCTION: This exam was performed according to the departmental dose-optimization program which  includes automated exposure control, adjustment of the mA and/or kV according to patient size and/or use of iterative reconstruction technique. CONTRAST:  OMNIPAQUE IOHEXOL 350 MG/ML SOLN COMPARISON:  CT chest 09/10/2023 FINDINGS: Cardiovascular: Satisfactory opacification of the pulmonary arteries to the segmental level. No evidence of pulmonary embolism. Mild aortic atherosclerosis. No aneurysm. Coronary vascular calcification. Cardiomegaly. No pericardial effusion Mediastinum/Nodes: Patent trachea. No thyroid mass. Multiple subcentimeter mediastinal lymph nodes. AP window lymph nodes up to 11 mm. Esophagus within normal limits. Lungs/Pleura: Small right-sided pleural effusion. Partial right lower lobe consolidation, atelectasis favored over pneumonia. Partial atelectasis at the left base as well. Upper Abdomen: No acute finding. Musculoskeletal: Asymmetrical edema and stranding incompletely visualized within the right breast/chest wall and right  abdominal wall. Review of the MIP images confirms the above findings. IMPRESSION: 1. Negative for acute pulmonary embolus. 2. Small right-sided pleural effusion with partial right lower lobe consolidation, atelectasis favored over pneumonia. Partial atelectasis at the left base as well. 3. Cardiomegaly. 4. Asymmetrical edema and stranding incompletely visualized within the right breast/chest wall and right abdominal wall, presumably corresponding to the history of cellulitis. Dedicated palpation right breast imaging should be performed after resolution of acute symptoms. 5. Aortic atherosclerosis. Aortic Atherosclerosis (ICD10-I70.0). Electronically Signed   By: Jasmine Pang M.D.   On: 09/14/2023 01:12   ECHOCARDIOGRAM COMPLETE Result Date: 09/12/2023    ECHOCARDIOGRAM REPORT   Patient Name:   ELLIANA SANDIEGO Rester Date of Exam: 09/12/2023 Medical Rec #:  [id number]      Height:       62.5 in Accession #:    [id number]     Weight:       292.0 lb Date of Birth:  [birth year]     BSA:          2.259 m Patient Age:    62 years       BP:           114/67 mmHg Patient Gender: F              HR:           108 bpm. Exam Location:  Inpatient Procedure: 2D Echo, Cardiac Doppler, Color Doppler and Intracardiac            Opacification Agent Indications:    CHF- Acute Diastolic  History:        Patient has prior history of Echocardiogram examinations, most                 recent 05/29/2021. CAD; Risk Factors:Diabetes and Former Smoker.  Sonographer:    Karma Ganja Referring Phys: [id number] JENNIFER CHOI  Sonographer Comments: Technically challenging study due to limited acoustic windows and patient is obese. IMPRESSIONS  1. Left ventricular ejection fraction, by estimation, is 60 to 65%. The left ventricle has normal function. Left ventricular endocardial border not optimally defined to evaluate regional wall motion. Left ventricular diastolic parameters are indeterminate.  2. RV not well visualized, grossly appears normal in size and  function. . Right ventricular systolic function was not well visualized. The right ventricular size is not well visualized. Tricuspid regurgitation signal is inadequate for assessing PA pressure.  3. The mitral valve was not well visualized. No evidence of mitral valve regurgitation. No evidence of mitral stenosis.  4. The aortic valve was not well visualized. Aortic valve regurgitation is not visualized. No aortic stenosis is present.  5. Technically difficult study, poor visualization. FINDINGS  Left Ventricle: Left ventricular ejection fraction, by estimation,  is 60 to 65%. The left ventricle has normal function. Left ventricular endocardial border not optimally defined to evaluate regional wall motion. Definity contrast agent was given IV to delineate the left ventricular endocardial borders. The left ventricular internal cavity size was normal in size. There is no left ventricular hypertrophy. Left ventricular diastolic parameters are indeterminate. Right Ventricle: RV not well visualized, grossly appears normal in size and function. The right ventricular size is not well visualized. Right vetricular wall thickness was not well visualized. Right ventricular systolic function was not well visualized.  Tricuspid regurgitation signal is inadequate for assessing PA pressure. Left Atrium: Left atrial size was not well visualized. Right Atrium: Right atrial size was not well visualized. Pericardium: The pericardium was not well visualized. Mitral Valve: The mitral valve was not well visualized. No evidence of mitral valve regurgitation. No evidence of mitral valve stenosis. Tricuspid Valve: The tricuspid valve is not well visualized. Tricuspid valve regurgitation is not demonstrated. No evidence of tricuspid stenosis. Aortic Valve: The aortic valve was not well visualized. Aortic valve regurgitation is not visualized. No aortic stenosis is present. Aortic valve mean gradient measures 8.0 mmHg. Aortic valve peak  gradient measures 16.3 mmHg. Pulmonic Valve: The pulmonic valve was not well visualized. Pulmonic valve regurgitation is not visualized. No evidence of pulmonic stenosis. Aorta: The aortic root was not well visualized. IAS/Shunts: The interatrial septum was not well visualized.   Diastology LV e' medial:    6.53 cm/s LV E/e' medial:  20.4 LV e' lateral:   7.83 cm/s LV E/e' lateral: 17.0  RIGHT VENTRICLE RV Basal diam:  4.00 cm RV S prime:     11.30 cm/s TAPSE (M-mode): 1.7 cm LEFT ATRIUM             Index        RIGHT ATRIUM           Index LA Vol (A2C):   75.3 ml 33.34 ml/m  RA Area:     21.60 cm LA Vol (A4C):   80.2 ml 35.51 ml/m  RA Volume:   76.40 ml  33.83 ml/m LA Biplane Vol: 81.0 ml 35.86 ml/m  AORTIC VALVE AV Vmax:           202.00 cm/s AV Vmean:          129.000 cm/s AV VTI:            0.309 m AV Peak Grad:      16.3 mmHg AV Mean Grad:      8.0 mmHg LVOT Vmax:         92.30 cm/s LVOT Vmean:        58.700 cm/s LVOT VTI:          0.148 m LVOT/AV VTI ratio: 0.48 MITRAL VALVE MV Area (PHT): 5.88 cm     SHUNTS MV Decel Time: 129 msec     Systemic VTI: 0.15 m MV E velocity: 133.00 cm/s MV A velocity: 94.30 cm/s MV E/A ratio:  1.41 Dina Rich MD Electronically signed by Dina Rich MD Signature Date/Time: 09/12/2023/2:40:08 PM    Final    DG CHEST PORT 1 VIEW Result Date: 09/11/2023 CLINICAL DATA:  Short of breath EXAM: PORTABLE CHEST 1 VIEW COMPARISON:  09/10/2023 FINDINGS: Single frontal view of the chest demonstrates an enlarged cardiac silhouette. Increased pulmonary vascular congestion and diffuse interstitial prominence consistent with congestive heart failure and interstitial edema. Patchy consolidation at the right lung base may reflect asymmetric edema or atelectasis. No pneumothorax. No  acute bony abnormalities. IMPRESSION: 1. Findings consistent with mild to moderate congestive heart failure. Electronically Signed   By: Sharlet Salina M.D.   On: 09/11/2023 19:37   CT CHEST WO  CONTRAST Result Date: 09/10/2023 CLINICAL DATA:  History of recent falls with chest and abdominal pain, initial encounter EXAM: CT CHEST, ABDOMEN AND PELVIS WITHOUT CONTRAST TECHNIQUE: Multidetector CT imaging of the chest, abdomen and pelvis was performed following the standard protocol without IV contrast. RADIATION DOSE REDUCTION: This exam was performed according to the departmental dose-optimization program which includes automated exposure control, adjustment of the mA and/or kV according to patient size and/or use of iterative reconstruction technique. COMPARISON:  Chest x-ray from earlier in the same day. FINDINGS: CT CHEST FINDINGS Cardiovascular: Somewhat limited due to lack of IV contrast. Atherosclerotic calcifications of the thoracic aorta are noted. Mild cardiac enlargement is seen. Heavy coronary calcifications are noted. Mediastinum/Nodes: Thoracic inlet is within normal limits. No hilar or mediastinal adenopathy is noted. The esophagus as visualized is within normal limits. Lungs/Pleura: Lungs are well aerated bilaterally. Azygous lobe is seen. Small right-sided pleural effusion is noted with patchy atelectatic changes in the right base. No focal confluent infiltrate is seen. Musculoskeletal: Degenerative changes of the thoracic spine are noted. No acute rib abnormality is seen. Soft tissues edema is noted throughout the right breast without definitive abscess. CT ABDOMEN PELVIS FINDINGS Hepatobiliary: No focal liver abnormality is seen. Status post cholecystectomy. No biliary dilatation. Pancreas: Unremarkable. No pancreatic ductal dilatation or surrounding inflammatory changes. Spleen: Normal in size without focal abnormality. Adrenals/Urinary Tract: Adrenal glands are within normal limits. Kidneys are well visualized bilaterally. No renal calculi or obstructive changes are noted. The bladder is well distended. Stomach/Bowel: No obstructive or inflammatory changes of the colon are seen. The  appendix is within normal limits. Small bowel and stomach are unremarkable. Vascular/Lymphatic: Aortic atherosclerosis. No enlarged abdominal or pelvic lymph nodes. Reproductive: Status post hysterectomy. No adnexal masses. Other: No abdominal wall hernia or abnormality. No abdominopelvic ascites. Musculoskeletal: No acute bony abnormality is noted in the abdomen and pelvis. Degenerative changes of lumbar spine are seen. Similar subcutaneous edema and skin thickening is noted in the abdominal wall and pelvis which may represent some generalized cellulitis. Correlation with the physical exam is recommended. IMPRESSION: CT of the chest: Patchy atelectatic changes in the right base. Small effusion is noted on the right as well. Considerable subcutaneous edema within the right breast consistent with cellulitis. CT of the abdomen and pelvis: Considerable subcutaneous edema in the abdominal wall and gluteal regions similar to that seen in the right breast again suspicious for cellulitis. Diffuse skin thickening is noted. Electronically Signed   By: Alcide Clever M.D.   On: 09/10/2023 18:59   CT ABDOMEN PELVIS WO CONTRAST Result Date: 09/10/2023 CLINICAL DATA:  History of recent falls with chest and abdominal pain, initial encounter EXAM: CT CHEST, ABDOMEN AND PELVIS WITHOUT CONTRAST TECHNIQUE: Multidetector CT imaging of the chest, abdomen and pelvis was performed following the standard protocol without IV contrast. RADIATION DOSE REDUCTION: This exam was performed according to the departmental dose-optimization program which includes automated exposure control, adjustment of the mA and/or kV according to patient size and/or use of iterative reconstruction technique. COMPARISON:  Chest x-ray from earlier in the same day. FINDINGS: CT CHEST FINDINGS Cardiovascular: Somewhat limited due to lack of IV contrast. Atherosclerotic calcifications of the thoracic aorta are noted. Mild cardiac enlargement is seen. Heavy coronary  calcifications are noted. Mediastinum/Nodes: Thoracic inlet is within  normal limits. No hilar or mediastinal adenopathy is noted. The esophagus as visualized is within normal limits. Lungs/Pleura: Lungs are well aerated bilaterally. Azygous lobe is seen. Small right-sided pleural effusion is noted with patchy atelectatic changes in the right base. No focal confluent infiltrate is seen. Musculoskeletal: Degenerative changes of the thoracic spine are noted. No acute rib abnormality is seen. Soft tissues edema is noted throughout the right breast without definitive abscess. CT ABDOMEN PELVIS FINDINGS Hepatobiliary: No focal liver abnormality is seen. Status post cholecystectomy. No biliary dilatation. Pancreas: Unremarkable. No pancreatic ductal dilatation or surrounding inflammatory changes. Spleen: Normal in size without focal abnormality. Adrenals/Urinary Tract: Adrenal glands are within normal limits. Kidneys are well visualized bilaterally. No renal calculi or obstructive changes are noted. The bladder is well distended. Stomach/Bowel: No obstructive or inflammatory changes of the colon are seen. The appendix is within normal limits. Small bowel and stomach are unremarkable. Vascular/Lymphatic: Aortic atherosclerosis. No enlarged abdominal or pelvic lymph nodes. Reproductive: Status post hysterectomy. No adnexal masses. Other: No abdominal wall hernia or abnormality. No abdominopelvic ascites. Musculoskeletal: No acute bony abnormality is noted in the abdomen and pelvis. Degenerative changes of lumbar spine are seen. Similar subcutaneous edema and skin thickening is noted in the abdominal wall and pelvis which may represent some generalized cellulitis. Correlation with the physical exam is recommended. IMPRESSION: CT of the chest: Patchy atelectatic changes in the right base. Small effusion is noted on the right as well. Considerable subcutaneous edema within the right breast consistent with cellulitis. CT of the  abdomen and pelvis: Considerable subcutaneous edema in the abdominal wall and gluteal regions similar to that seen in the right breast again suspicious for cellulitis. Diffuse skin thickening is noted. Electronically Signed   By: Alcide Clever M.D.   On: 09/10/2023 18:59   CT L-SPINE NO CHARGE Result Date: 09/10/2023 CLINICAL DATA:  Fall.  Leg pain. EXAM: CT LUMBAR SPINE WITHOUT CONTRAST TECHNIQUE: Multidetector CT imaging of the lumbar spine was performed without intravenous contrast administration. Multiplanar CT image reconstructions were also generated. RADIATION DOSE REDUCTION: This exam was performed according to the departmental dose-optimization program which includes automated exposure control, adjustment of the mA and/or kV according to patient size and/or use of iterative reconstruction technique. COMPARISON:  MRI lumbar spine 05/25/2016. FINDINGS: Segmentation: Conventional numbering is assumed with 5 non-rib-bearing, lumbar type vertebral bodies. Alignment: Unchanged grade 1 anterolisthesis of L4 on L5. Levoscoliotic curvature of the lumbar spine. Vertebrae: Normal vertebral body heights. No acute fracture or suspicious bone lesion. Multilevel degenerative endplate changes. Paraspinal and other soft tissues: Atherosclerotic calcifications of the abdominal aorta and its branches. Disc levels: T12-L1: Right-greater-than-left facet arthropathy. No spinal canal stenosis or neural foraminal narrowing. L1-L2: Disc bulge and facet arthropathy results in mild spinal canal stenosis and moderate right neural foraminal narrowing. L2-L3: Left eccentric disc bulge and right eccentric endplate osteophytes results in mild spinal canal stenosis and moderate right neural foraminal narrowing. L3-L4: Disc bulge and right-greater-than-left facet arthropathy results in mild spinal canal stenosis, severe right and moderate left neural foraminal narrowing. L4-L5: Acquired fusion. No spinal canal stenosis or neural  foraminal narrowing. L5-S1: No disc herniation. Severe left-greater-than-right facet arthropathy results in severe left and moderate right neural foraminal narrowing. IMPRESSION: 1. No acute fracture or traumatic listhesis of the lumbar spine. 2. Multilevel degenerative changes of the lumbar spine, as described above. Aortic Atherosclerosis (ICD10-I70.0). Electronically Signed   By: Orvan Falconer M.D.   On: 09/10/2023 17:08   DG Shoulder Right  Portable Result Date: 09/10/2023 CLINICAL DATA:  Fall EXAM: RIGHT SHOULDER - 1 VIEW COMPARISON:  None available FINDINGS: No fracture or dislocation. Irregularity of the greater tuberosity indicative of rotator cuff tendinopathy. Mild right acromioclavicular osteoarthrosis. Advanced degenerative changes of the visualized cervical spine. Examination limited due to under penetration. IMPRESSION: No acute abnormality of the right shoulder. Electronically Signed   By: Acquanetta Belling M.D.   On: 09/10/2023 15:46   DG Chest Portable 1 View Result Date: 09/10/2023 CLINICAL DATA:  Fall EXAM: PORTABLE CHEST - 1 VIEW COMPARISON:  09/02/2023 FINDINGS: Unchanged cardiomegaly.  No pulmonary vascular congestion. Minimal right basilar atelectasis, similar to prior examination. Lungs are otherwise clear. Overall examination limited due to underpenetration. IMPRESSION: Limited examination.  Mild right basilar atelectasis. Electronically Signed   By: Acquanetta Belling M.D.   On: 09/10/2023 15:45   UE Venous Duplex ([facility] and [facility] ONLY) Result Date: 09/03/2023 UPPER VENOUS STUDY  Patient Name:  KEEYA THUNDER End[place]ott  Date of Exam:   09/03/2023 Medical Rec #: [id number]       Accession #:    [id number] Date of Birth: 02/02/1961      Patient Gender: F Patient Age:   63 years Exam Location:  [facility] Procedure:      VAS [place] UPPER EXTREMITY VENOUS DUPLEX Referring Phys: Dione Booze --------------------------------------------------------------------------------  Indications: SOB, Swelling,  Edema, Pain, and right shoulder pain. Comparison Study: No prior exam. Performing Technologist: Fernande Bras  Examination Guidelines: A complete evaluation includes B-mode imaging, spectral Doppler, color Doppler, and power Doppler as needed of all accessible portions of each vessel. Bilateral testing is considered an integral part of a complete examination. Limited examinations for reoccurring indications may be performed as noted.  Right Findings: +----------+------------+---------+-----------+----------+-------+ RIGHT     CompressiblePhasicitySpontaneousPropertiesSummary +----------+------------+---------+-----------+----------+-------+ IJV           Full       Yes       Yes                      +----------+------------+---------+-----------+----------+-------+ Subclavian               Yes       Yes                      +----------+------------+---------+-----------+----------+-------+ Axillary      Full       Yes       Yes                      +----------+------------+---------+-----------+----------+-------+ Brachial      Full       Yes       Yes                      +----------+------------+---------+-----------+----------+-------+ Radial        Full                                          +----------+------------+---------+-----------+----------+-------+ Ulnar         Full                                          +----------+------------+---------+-----------+----------+-------+ Cephalic      Full  Yes       Yes                      +----------+------------+---------+-----------+----------+-------+ Basilic       Full       Yes       Yes                      +----------+------------+---------+-----------+----------+-------+  Left Findings: +----------+------------+---------+-----------+----------+-------+ LEFT      CompressiblePhasicitySpontaneousPropertiesSummary  +----------+------------+---------+-----------+----------+-------+ Subclavian    Full       Yes       Yes                      +----------+------------+---------+-----------+----------+-------+  Summary:  Right: No evidence of deep vein thrombosis in the upper extremity. No evidence of superficial vein thrombosis in the upper extremity.  Left: No evidence of thrombosis in the subclavian.  *See table(s) above for measurements and observations.  Diagnosing physician: Coral Else MD Electronically signed by Coral Else MD on 09/03/2023 at 3:23:51 PM.    Final    DG Chest Portable 1 View Result Date: 09/02/2023 CLINICAL DATA:  Shortness of breath EXAM: PORTABLE CHEST 1 VIEW COMPARISON:  05/26/2021 FINDINGS: Cardiomegaly. Vascular congestion. Right base atelectasis. No overt edema or effusions. No acute bony abnormality. IMPRESSION: Cardiomegaly, vascular congestion. Right base atelectasis. Electronically Signed   By: Charlett Nose M.D.   On: 09/02/2023 22:18   DG Knee Complete 4 Views Left Result Date: 09/02/2023 CLINICAL DATA:  Left knee pain. EXAM: LEFT KNEE - COMPLETE 4+ VIEW COMPARISON:  None Available. FINDINGS: There is no acute fracture or dislocation. The bones are osteopenic. Moderate narrowing of the medial compartment. No joint effusion. Diffuse subcutaneous edema. No radiopaque foreign object or soft tissue gas. IMPRESSION: 1. No acute fracture or dislocation. 2. Moderate narrowing of the medial compartment. Electronically Signed   By: Elgie Collard M.D.   On: 09/02/2023 22:17     The results of significant diagnostics from this hospitalization (including imaging, microbiology, ancillary and laboratory) are listed below for reference.     Microbiology: No results found for this or any previous visit (from the past 240 hours).   Labs: BNP (last 3 results) Recent Labs    09/12/23 0755  BNP 106.8*   Basic Metabolic Panel: Recent Labs  Lab 09/25/23 0344 09/25/23 0347  09/26/23 0351 09/26/23 1325 09/27/23 0348 09/28/23 0415 09/29/23 0430 09/30/23 0349 10/01/23 0308  NA  --    < > 137   < > 134* 135 135 136 137  K  --    < > 4.0   < > 3.5 3.7 3.4* 3.5 3.5  CL  --    < > 84*  --  81* 87* 86* 90* 92*  CO2  --    < > 40*  --  38* 36* 38* 31 29  GLUCOSE  --    < > 130*  --  154* 165* 240* 265* 177*  BUN  --    < > 22  --  22 23 23  24* 24*  CREATININE  --    < > 1.23*  --  1.16* 1.04* 1.23* 1.10* 1.09*  CALCIUM  --    < > 9.5  --  9.2 9.6 9.5 9.4 9.1  MG  --    < > 2.2  --  2.2 2.1 2.0 1.9 1.8  PHOS 4.3  --  5.1*  --   --   --   --   --   --    < > =  values in this interval not displayed.   Liver Function Tests: No results for input(s): "AST", "ALT", "ALKPHOS", "BILITOT", "PROT", "ALBUMIN" in the last 168 hours. No results for input(s): "LIPASE", "AMYLASE" in the last 168 hours. No results for input(s): "AMMONIA" in the last 168 hours. CBC: Recent Labs  Lab 09/27/23 0348 09/28/23 0415 09/29/23 0430 09/30/23 0349 10/01/23 0308  WBC 7.8 6.6 7.5 7.4 7.6  HGB 11.9* 11.4* 11.3* 11.8* 11.8*  HCT 46.7* 43.8 42.5 44.6 44.2  MCV 77.6* 77.2* 77.7* 78.4* 79.2*  PLT 410* 385 350 355 349   Cardiac Enzymes: No results for input(s): "CKTOTAL", "CKMB", "CKMBINDEX", "TROPONINI" in the last 168 hours. BNP: Invalid input(s): "POCBNP" CBG: Recent Labs  Lab 09/30/23 1614 09/30/23 2110 10/01/23 0254 10/01/23 0722 10/01/23 1152  GLUCAP 325* 203* 184* 189* 322*   D-Dimer No results for input(s): "DDIMER" in the last 72 hours. Hgb A1c No results for input(s): "HGBA1C" in the last 72 hours. Lipid Profile No results for input(s): "CHOL", "HDL", "LDLCALC", "TRIG", "CHOLHDL", "LDLDIRECT" in the last 72 hours. Thyroid function studies No results for input(s): "TSH", "T4TOTAL", "T3FREE", "THYROIDAB" in the last 72 hours.  Invalid input(s): "FREET3" Anemia work up No results for input(s): "VITAMINB12", "FOLATE", "FERRITIN", "TIBC", "IRON", "RETICCTPCT" in  the last 72 hours. Urinalysis    Component Value Date/Time   COLORURINE YELLOW 06/21/2021 0855   APPEARANCEUR CLOUDY (A) 06/21/2021 0855   LABSPEC 1.005 06/21/2021 0855   PHURINE 5.0 06/21/2021 0855   GLUCOSEU NEGATIVE 06/21/2021 0855   HGBUR NEGATIVE 06/21/2021 0855   BILIRUBINUR NEGATIVE 06/21/2021 0855   KETONESUR NEGATIVE 06/21/2021 0855   PROTEINUR NEGATIVE 06/21/2021 0855   UROBILINOGEN 0.2 02/10/2015 1835   NITRITE NEGATIVE 06/21/2021 0855   LEUKOCYTESUR LARGE (A) 06/21/2021 0855   Sepsis Labs Recent Labs  Lab 09/28/23 0415 09/29/23 0430 09/30/23 0349 10/01/23 0308  WBC 6.6 7.5 7.4 7.6   Microbiology No results found for this or any previous visit (from the past 240 hours).   Time coordinating discharge:  I have spent 35 minutes face to face with the patient and on the ward discussing the patients care, assessment, plan and disposition with other care givers. >50% of the time was devoted counseling the patient about the risks and benefits of treatment/Discharge disposition and coordinating care.   SIGNED:   Miguel Rota, MD  [facility] 10/01/2023, 12:15 PM   If 7PM-7AM, please contact night-coverage

## 2023-10-01 NOTE — Progress Notes (Signed)
Occupational Therapy Treatment Patient Details Name: [name] MRN: [id number] DOB: 1960/12/07 Today's Date: 10/01/2023   History of present illness [name] is a 63 y.o.presented 09/10/23 from PCP for worsening Right breast cellulitis, LLE cellulitis. PMH: morbid obesity, right AKA, irritable bowel syndrome, hypertension, hyperlipidemia, hypothyroidism, type 2 diabetes mellitus, rheumatoid arthritis, asthma   OT comments  Patient reported plan was to d/c home today despite continued education on safety with patient unable to transfer with +2 A on this date. Patient reported it would be better at home. Patient was educated on speaking with husband about her plans for d/c because he would be the one assisting her. Patient reported that she had not discussed with him her current assist level for movement. Patient does not have +2 help at home and would be alone during the day while husband is at work. Patient will benefit from continued inpatient follow up therapy, <3 hours/day       If plan is discharge home, recommend the following:  Two people to help with walking and/or transfers;Two people to help with bathing/dressing/bathroom;Assistance with cooking/housework;Direct supervision/assist for medications management;Assist for transportation;Help with stairs or ramp for entrance;Direct supervision/assist for financial management   Equipment Recommendations  None recommended by OT       Precautions / Restrictions Precautions Precautions: Fall Precaution Comments: R AKA, right shoulder pain ? RTC injury/,  peri wounds Restrictions Weight Bearing Restrictions Per Provider Order: No RLE Weight Bearing Per Provider Order: Non weight bearing Other Position/Activity Restrictions: RT AKA with wound so has been unable to use prosthesis, reports she also wanted to lose weight first       Mobility Bed Mobility Overal bed mobility: Needs Assistance Bed Mobility: Supine to Sit, Sit to  Supine     Supine to sit: Supervision, HOB elevated, Used rails Sit to supine: Mod assist   General bed mobility comments: with increased time to advacen to EOB and back into bed with pain in RUE impacting movement.             Balance Overall balance assessment: Needs assistance, History of Falls Sitting-balance support: Feet supported Sitting balance-[name]: Good         Standing balance comment: unable                           ADL either performed or assessed with clinical judgement   ADL Overall ADL's : Needs assistance/impaired       Toilet Transfer Details (indicate cue type and reason): attempted to transfer x2 with inability to stand. able to clear bottom off bed but unable to transition into upright standing with +2 TD. patient asking about how to strengthen R shoulder. patient was educated on continuing to use it for function v.s. exercises.           General ADL Comments: patient was continuously educated on concerns over being home alone during the day. patient reported " when i get home..."  patient reported husband is unaware that she is unable to transfer. patient was educated on importance of communication with husband/caregiver to engage in best d/c recommendations.      Cognition Arousal: Alert Behavior During Therapy: WFL for tasks assessed/performed Overall Cognitive Status: Impaired/Different from baseline Area of Impairment: Problem solving             General Comments: appeared to have poor insight to deficits.  Pertinent Vitals/ Pain       Pain Assessment Pain Assessment: Faces Faces Pain Scale: Hurts little more Pain Location: right shoulder and lower back Pain Descriptors / Indicators: Grimacing, Guarding Pain Intervention(s): Limited activity within patient's tolerance, Monitored during session, Repositioned         Frequency  Min 1X/week        Progress Toward Goals  OT  Goals(current goals can now be found in the care plan section)  Progress towards OT goals: Not progressing toward goals - comment     Plan      Co-evaluation      Reason for Co-Treatment: Necessary to address cognition/behavior during functional activity;To address functional/ADL transfers PT goals addressed during session: Mobility/safety with mobility OT goals addressed during session: ADL's and self-care      AM-PAC OT "6 Clicks" Daily Activity     Outcome Measure   Help from another person eating meals?: None Help from another person taking care of personal grooming?: A Little Help from another person toileting, which includes using toliet, bedpan, or urinal?: Total Help from another person bathing (including washing, rinsing, drying)?: A Lot Help from another person to put on and taking off regular upper body clothing?: A Little Help from another person to put on and taking off regular lower body clothing?: Total 6 Click Score: 14    End of Session Equipment Utilized During Treatment: Gait belt;Rolling walker (2 wheels)  OT Visit Diagnosis: Pain;Unsteadiness on feet (R26.81);History of falling (Z91.81) Pain - Right/Left: Right Pain - part of body: Shoulder   Activity Tolerance Patient limited by pain;Patient limited by fatigue   Patient Left in bed;with call bell/phone within reach;with bed alarm set;with nursing/sitter in room   Nurse Communication Mobility status;Other (comment) (concerns on d/c home alone)        Time: 1150-1236 OT Time Calculation (min): 46 min  Charges: OT General Charges $OT Visit: 1 Visit OT Treatments $Therapeutic Activity: 23-37 mins  Rosalio Loud, MS Acute Rehabilitation Department Office# [phone]   Selinda Flavin 10/01/2023, 1:06 PM

## 2023-10-01 NOTE — Progress Notes (Signed)
Physical Therapy Treatment Patient Details Name: [name] MRN: [id number] DOB: 1961-06-03 Today's Date: 10/01/2023   History of Present Illness [name] is a 63 y.o.presented 09/10/23 from PCP for worsening Right breast cellulitis, LLE cellulitis. PMH: morbid obesity, right AKA, irritable bowel syndrome, hypertension, hyperlipidemia, hypothyroidism, type 2 diabetes mellitus, rheumatoid arthritis, asthma    PT Comments   The  patient  reports desire to  DC home today. Patient stated that she needs to be able to transfer to her WC. Patient made several attempts to stand at Rw without getting to full stand, Had previously stood for  a few seconds in previous visit with PT.  Discussed with patient again therapy concerns for DC home as patient has yet to safely transfer . Patient will be home alone several hours per day and is at risk for fall if she attempts transfers. Recommend SNF, encouraged patient to discuss with spouse therapy concerns and recommendation for SNF if eligible.   If plan is discharge home, recommend the following: Two people to help with walking and/or transfers;Assist for transportation;Help with stairs or ramp for entrance;A lot of help with bathing/dressing/bathroom   Can travel by private vehicle     No  Equipment Recommendations    Hospital bed   Recommendations for Other Services       Precautions / Restrictions Precautions Precautions: Fall Precaution Comments: R AKA, right shoulder pain ? RTC injury/,  peri wounds Restrictions Weight Bearing Restrictions Per Provider Order: No RLE Weight Bearing Per Provider Order: Non weight bearing Other Position/Activity Restrictions: RT AKA with wound so has been unable to use prosthesis, reports she also wanted to lose weight first     Mobility  Bed Mobility Overal bed mobility: Needs Assistance Bed Mobility: Supine to Sit, Sit to Supine Rolling: Min assist   Supine to sit: Supervision, HOB elevated,  Used rails     General bed mobility comments: with increased time to advace to EOB and back into bed with pain in RUE impacting movement. Assist LLE back onto bed.    Transfers Overall transfer level: Needs assistance Equipment used: Rolling walker (2 wheels) Transfers: Sit to/from Stand Sit to Stand: Max assist, +2 physical assistance, +2 safety/equipment           General transfer comment: Patient attempted to stand  from bed, unsuccessful   from lower bed, elevated the bed, still not able to  stand to pivot safely  to WC.    Ambulation/Gait                   Stairs             Wheelchair Mobility     Tilt Bed    Modified Rankin (Stroke Patients Only)       Balance   Sitting-balance support: Feet supported Sitting balance-[name]: Good Sitting balance - Comments: able to sit, Postural control: Right lateral lean Standing balance support: Bilateral upper extremity supported, During functional activity, Reliant on assistive device for balance   Standing balance comment: unable                            Cognition Arousal: Alert Behavior During Therapy: WFL for tasks assessed/performed                                   General Comments: appeared to  have poor insight to deficits. Patient stating that she can go home ,  and feels she can transfer. Discussed the risks of being home alone, not being able to transfer at this time.        Exercises      General Comments        Pertinent Vitals/Pain Pain Assessment Faces Pain Scale: Hurts little more Pain Location: right shoulder and lower back Pain Descriptors / Indicators: Grimacing, Guarding Pain Intervention(s): Monitored during session    Home Living                          Prior Function            PT Goals (current goals can now be found in the care plan section) Progress towards PT goals: Progressing toward goals    Frequency     Min 1X/week      PT Plan      Co-evaluation PT/OT/SLP Co-Evaluation/Treatment: Yes Reason for Co-Treatment: Necessary to address cognition/behavior during functional activity;To address functional/ADL transfers PT goals addressed during session: Mobility/safety with mobility OT goals addressed during session: ADL's and self-care      AM-PAC PT "6 Clicks" Mobility   Outcome Measure  Help needed turning from your back to your side while in a flat bed without using bedrails?: A Little Help needed moving from lying on your back to sitting on the side of a flat bed without using bedrails?: A Little Help needed moving to and from a bed to a chair (including a wheelchair)?: A Lot Help needed standing up from a chair using your arms (e.g., wheelchair or bedside chair)?: Total Help needed to walk in hospital room?: Total Help needed climbing 3-5 steps with a railing? : Total 6 Click Score: 11    End of Session Equipment Utilized During Treatment: Gait belt Activity Tolerance: Patient tolerated treatment well Patient left: in bed;with call bell/phone within reach;with bed alarm set;with nursing/sitter in room Nurse Communication: Mobility status;Need for lift equipment PT Visit Diagnosis: Unsteadiness on feet (R26.81);Muscle weakness (generalized) (M62.81);Difficulty in walking, not elsewhere classified (R26.2) Pain - Right/Left: Right Pain - part of body: Shoulder     Time: 1308-6578 PT Time Calculation (min) (ACUTE ONLY): 63 min  Charges:    $Therapeutic Activity: 23-37 mins PT General Charges $$ ACUTE PT VISIT: 1 Visit                     Blanchard Kelch PT Acute Rehabilitation Services Office [phone] Weekend [phone]    Rada Hay 10/01/2023, 1:16 PM

## 2023-10-02 ENCOUNTER — Telehealth: Payer: Self-pay

## 2023-10-02 LAB — LIPOPROTEIN A (LPA): Lipoprotein (a): 36.4 nmol/L — ABNORMAL HIGH (ref <75.0)

## 2023-10-02 NOTE — Transitions of Care (Post Inpatient/ED Visit) (Signed)
10/02/2023  Name: [name] MRN: [id number] DOB: [birth year]  Today's TOC FU Call Status: Today's TOC FU Call Status:: Successful TOC FU Call Completed TOC FU Call Complete Date: 10/02/23 Patient's Name and Date of Birth confirmed.  Transition Care Management Follow-up Telephone Call Date of Discharge: 10/01/23 Discharge Facility: [facility]) Type of Discharge: Inpatient Admission Primary Inpatient Discharge Diagnosis:: Sepsis, Cellulitis of right breast and left calf Any questions or concerns?: No  Items Reviewed: Did you receive and understand the discharge instructions provided?: Yes Medications obtained,verified, and reconciled?: Yes (Medications Reviewed) Any new allergies since your discharge?: No Dietary orders reviewed?: Yes Type of Diet Ordered:: Heart Healthy Do you have support at home?: Yes People in Home: spouse Name of Support/Comfort Primary Source: Casimiro Needle Newmann  Medications Reviewed Today: Medications Reviewed Today     Reviewed by Wyline Mood, RN (Case Manager) on 10/02/23 at 1758  Med List Status: <None>   Medication Order Taking? Sig Documenting Provider Last Dose Status Informant  acetaminophen (TYLENOL) 500 MG tablet [id number] Yes Take 1,000 mg by mouth as needed for mild pain (pain score 1-3). [provider] Taking Active Self, Pharmacy Records  albuterol (PROVENTIL) (2.5 MG/3ML) 0.083% nebulizer solution [id number] Yes USE 1 VIAL IN NEBULIZER EVERY 4 HOURS AS NEEDED FOR WHEEZING FOR SHORTNESS OF BREATH Wendling, Jilda Roche, DO Taking Active Self, Pharmacy Records  albuterol (VENTOLIN HFA) 108 (90 Base) MCG/ACT inhaler [id number] Yes INHALE 2 PUFFS BY MOUTH EVERY 6 HOURS AS NEEDED FOR WHEEZING FOR SHORTNESS OF BREATH  Patient taking differently: Inhale 2 puffs into the lungs every 4 (four) hours as needed for wheezing or shortness of breath.   Sharlene Dory, DO Taking Active Self, Pharmacy Records  apixaban (ELIQUIS) 5  MG TABS tablet [id number] Yes Take 1 tablet (5 mg total) by mouth 2 (two) times daily. Miguel Rota, MD Taking Active   augmented betamethasone dipropionate (DIPROLENE AF) 0.05 % cream [id number] Yes Apply 1 Application topically 2 (two) times daily as needed (rash). Sharlene Dory, DO Taking Active Self, Pharmacy Records  Continuous Glucose Sensor (DEXCOM G7 SENSOR) [place] [id number] Yes USE TO CHECK FOR BLOOD SUGAR CONTINUOUSLY- CHANGE SENSOR EVERY 10 DAYS Wendling, Jilda Roche, DO Taking Active Self, Pharmacy Records  diclofenac Sodium (PENNSAID) 2 % SOLN [id number] Yes APPLY 2 PUMPS (40MG  TOTAL) TOPICALLY TWICE DAILY AS NEEDED  Patient taking differently: Apply topically in the morning and at bedtime. 1 application to back twice daily   Sharlene Dory, DO Taking Active Self, Pharmacy Records  diclofenac Sodium (VOLTAREN) 1 % GEL [id number] Yes Apply 2 g topically 4 (four) times daily.  Patient taking differently: Apply 1 Application topically 2 (two) times daily as needed (pain).   Horton Chin, MD Taking Active Self, Pharmacy Records  diltiazem (CARDIZEM CD) 360 MG 24 hr capsule [id number] Yes Take 1 capsule (360 mg total) by mouth daily. Miguel Rota, MD Taking Active   diphenhydrAMINE 25 mg in sodium chloride 0.9 % 50 mL [id number] Yes Inject 25 mg into the vein as needed (allergy). [provider] Taking Active Self, Pharmacy Records           Med Note (CRUTHIS, CHLOE C   Tue Sep 11, 2023  7:56 AM) Never used   EPINEPHrine 0.3 mg/0.3 mL IJ SOAJ injection [id number] Yes Inject 0.3 mg into the muscle as needed for anaphylaxis. Sharlene Dory, DO Taking Active Self, Pharmacy Records  Med Note (CRUTHIS, CHLOE C   Tue Sep 11, 2023  7:57 AM) Never used   ferrous sulfate 325 (65 FE) MG tablet [id number]  Take 1 tablet (325 mg total) by mouth 2 (two) times daily with a meal. Amin, Ankit C, MD  Active   fluticasone (FLONASE) 50 MCG/ACT nasal spray [id number]   USE 2 SPRAY(S) IN EACH NOSTRIL ONCE DAILY AS NEEDED FOR ALLERGIES OR  RHINITIS  Patient taking differently: Place 2 sprays into both nostrils in the morning and at bedtime.   Sharlene Dory, DO  Active Self, Pharmacy Records  gabapentin (NEURONTIN) 400 MG capsule [id number] Yes Take 1 capsule (400 mg total) by mouth 4 (four) times daily. TAKE 1 CAPSULE BY MOUTH IN THE MORNING AND 1 AT NOON AND 2 AT BEDTIME Strength: 400 mg  Patient taking differently: Take 400-800 mg by mouth See admin instructions. Take 400 mg by mouth in the morning and the afternoon. Take 800 mg by mouth at bedtime.   Horton Chin, MD Taking Active Self, Pharmacy Records  gentamicin ointment (GARAMYCIN) 0.1 % [id number]  Apply 1 Application topically in the morning and at bedtime. [provider]  Active Self, Pharmacy Records  Glucagon, rDNA, (GLUCAGON EMERGENCY) 1 MG KIT [id number]  Inject 1 mg into the vein daily as needed. Sharlene Dory, DO  Active Self, Pharmacy Records           Med Note (CRUTHIS, Marcy Siren   Tue Sep 11, 2023  8:01 AM) Never used   guaiFENesin (MUCINEX) 600 MG 12 hr tablet [id number] Yes Take 600 mg by mouth 2 (two) times daily. [provider] Taking Active Self, Pharmacy Records  HUMULIN R 500 UNIT/ML injection [id number] Yes INJECT 0.08-0.3 MLS (40-150 UNITS) INTO THE SKIN THREE TIMES DAILY WITH MEALS  Patient taking differently: Inject 5-15 Units into the skin in the morning, at noon, and at bedtime. Takes 5 units three times a day with meals.  Uses SSI on top of the scheduled insulin with 3-10 units depending on blood sugar level   Wendling, Jilda Roche, DO Taking Active Self, Pharmacy Records  hydrocortisone cream 1 % [id number] Yes Apply 1 application topically 3 (three) times daily as needed for itching (minor skin irritation).  Patient taking differently: Apply 1 application  topically in the morning and at bedtime.   Love, Evlyn Kanner, PA-C Taking Active Self,  Pharmacy Records  INSULIN SYRINGE 1CC/29G (B-D INSULIN SYRINGE) 29G X 1/2" 1 ML MISC [id number] Yes Use three times daily with insulin.  DX E11.9 Sharlene Dory, DO Taking Active Self, Pharmacy Records  Insulin Syringes, Disposable, U-100 0.5 ML MISC [id number] Yes 100 each by Does not apply route 4 (four) times daily -  before meals and at bedtime. Miguel Rota, MD Taking Active   isosorbide mononitrate (IMDUR) 60 MG 24 hr tablet [id number] Yes Take 1 tablet (60 mg total) by mouth daily. Swinyer, Zachary George, NP Taking Active Self, Pharmacy Records  levocetirizine (XYZAL) 5 MG tablet [id number] Yes TAKE 1 TABLET BY MOUTH ONCE DAILY IN THE EVENING Wendling, Jilda Roche, DO Taking Active Self, Pharmacy Records  levothyroxine (SYNTHROID) 200 MCG tablet [id number] Yes TAKE 1 TABLET BY MOUTH ONCE DAILY BEFORE BREAKFAST Sharlene Dory, DO Taking Active   lidocaine (LIDODERM) 5 % [id number] Yes Place 1 patch onto the skin every 12 (twelve) hours. Remove & Discard patch within 12 hours or as directed  Patient taking differently: Place 1 patch onto the skin daily  as needed (pain). Remove & Discard patch within 12 hours or as directed   Jones Bales, NP Taking Active Self, Pharmacy Records  lidocaine (XYLOCAINE) 5 % ointment [id number] Yes Apply 1 Application topically in the morning and at bedtime. [provider] Taking Active Self, Pharmacy Records  metFORMIN (GLUCOPHAGE-XR) 500 MG 24 hr tablet [id number] Yes Take 2 tablets by mouth twice daily Wendling, Jilda Roche, DO Taking Active   Multiple Vitamins-Minerals (WOMENS 50+ MULTI VITAMIN PO) [id number]  Take 1 tablet by mouth in the morning and at bedtime. [provider]  Active Self, Pharmacy Records  mupirocin ointment (BACTROBAN) 2 % [id number] Yes Apply topically 2 (two) times daily.  Patient taking differently: Apply 1 Application topically 2 (two) times daily.   Sharlene Dory, DO Taking Active Self, Pharmacy  Records  naloxone [facility]) nasal spray 4 mg/0.1 mL [id number]  Place 1 spray into the nose as needed (opoid overdose). [provider]  Active Self, Pharmacy Records           Med Note (CRUTHIS, CHLOE C   Tue Sep 11, 2023  8:13 AM) Never used   naproxen sodium (ALEVE) 220 MG tablet [id number] Yes Take 220 mg by mouth 2 (two) times daily as needed (pain). [provider] Taking Active Self, Pharmacy Records  nitroGLYCERIN (NITROSTAT) 0.4 MG SL tablet [id number] Yes DISSOLVE ONE TABLET UNDER THE TONGUE EVERY 5 MINUTES AS NEEDED FOR CHEST PAIN.  DO NOT EXCEED A TOTAL OF 3 DOSES IN 15 MINUTES Quintella Reichert, MD Taking Active Self, Pharmacy Records           Med Note Epimenio Sarin, Edison Nasuti Sep 11, 2023  8:13 AM) Never used   omeprazole (PRILOSEC) 40 MG capsule [id number] Yes Take 1 capsule by mouth once daily Wendling, Jilda Roche, DO Taking Active Self, Pharmacy Records  [facility] Lancets 33G [place] [id number] Yes Use to check blood glucose up to 4 times a day Sharlene Dory, DO Taking Active Self, Pharmacy Records  [facility] VERIO test strip [id number] Yes Use as instructed Sharlene Dory, DO Taking Active Self, Pharmacy Records  Oxycodone HCl 20 MG TABS [id number] Yes Take 1 tablet (20 mg total) by mouth every 8 (eight) hours as needed. Jones Bales, NP Taking Active Self, Pharmacy Records  oxymetazoline (AFRIN) 0.05 % nasal spray [id number] Yes Place 2 sprays into both nostrils 2 (two) times daily as needed for congestion. [provider] Taking Active Self, Pharmacy Records  Polyethyl Glyc-Propyl Glyc PF (SYSTANE PRESERVATIVE FREE) 0.4-0.3 % SOLN [id number] Yes Place 2 drops into both eyes in the morning and at bedtime. [provider] Taking Active Self, Pharmacy Records  pramipexole (MIRAPEX) 1 MG tablet [id number] Yes Take 1 tablet (1 mg total) by mouth 3 (three) times daily. Saguier, Ramon Dredge, PA-C Taking Active Self, Pharmacy Records   pregabalin (LYRICA) 50 MG capsule [id number] Yes Take 1 capsule (50 mg total) by mouth 3 (three) times daily. Horton Chin, MD Taking Active Self, Pharmacy Records  promethazine (PHENERGAN) 25 MG tablet [id number] Yes Take 1 tablet (25 mg total) by mouth every 8 (eight) hours as needed for vomiting or nausea.  Patient taking differently: Take 25 mg by mouth as needed for vomiting or nausea.   Sharlene Dory, DO Taking Active Self, Pharmacy Records  senna-docusate (SENOKOT-S) 8.6-50 MG tablet [id number] Yes Take 2 tablets by mouth at bedtime as needed for moderate constipation. Miguel Rota, MD Taking Active  spironolactone (ALDACTONE) 50 MG tablet [id number] Yes Take 1 tablet (50 mg total) by mouth daily. Miguel Rota, MD Taking Active   torsemide 40 MG TABS [id number] Yes Take 40 mg by mouth daily. Miguel Rota, MD Taking Active   Vitamin D, Ergocalciferol, (DRISDOL) 1.25 MG (50000 UNIT) CAPS capsule [id number] Yes Take 1 capsule (50,000 Units total) by mouth once a week. Sharlene Dory, DO Taking Active             Home Care and Equipment/Supplies: Were Home Health Services Ordered?: Yes Name of Home Health Agency:: [phone], [phone] Has Agency set up a time to come to your home?: No EMR reviewed for Home Health Orders: Orders present/patient has not received call (refer to CM for follow-up) Any new equipment or medical supplies ordered?: Yes Name of Medical supply agency?: [employer] Were you able to get the equipment/medical supplies?: Yes Do you have any questions related to the use of the equipment/supplies?: No  Functional Questionnaire: Do you need assistance with bathing/showering or dressing?: Yes (Supportive spouse who assist, HH services pending) Do you need assistance with meal preparation?: Yes Do you need assistance with eating?: No Do you have difficulty maintaining continence: Yes (foley cath) Do you need assistance with getting  out of bed/getting out of a chair/moving?: Yes Do you have difficulty managing or taking your medications?: No  Follow up appointments reviewed: PCP Follow-up appointment confirmed?: No (Patient states she will schedule) MD Provider Line Number:[phone] Given: No Specialist Hospital Follow-up appointment confirmed?: Yes Date of Specialist follow-up appointment?: 10/04/23 Follow-Up Specialty Provider:: Eligha Bridegroom - Cardiology Do you need transportation to your follow-up appointment?: Yes (upcoming appts) Transportation Need Intervention Addressed By:: AMB Referral For SDOH Needs Do you understand care options if your condition(s) worsen?: Yes-patient verbalized understanding  SDOH Interventions Today    Flowsheet Row Most Recent Value  SDOH Interventions   Food Insecurity Interventions Intervention Not Indicated  Housing Interventions Intervention Not Indicated, Inpatient TOC  Transportation Interventions AMB Referral  Utilities Interventions Intervention Not Indicated      TOC Interventions Today    Flowsheet Row Most Recent Value  TOC Interventions   TOC Interventions Discussed/Reviewed TOC Interventions Discussed, TOC Interventions Reviewed, S/S of infection, Post op wound/incision care, Post discharge activity limitations per provider       TOC Interventions Today    Flowsheet Row Most Recent Value  TOC Interventions   TOC Interventions Discussed/Reviewed TOC Interventions Discussed, TOC Interventions Reviewed, S/S of infection, Post op wound/incision care, Post discharge activity limitations per provider       Interventions Today    Flowsheet Row Most Recent Value  Chronic Disease   Chronic disease during today's visit Diabetes, Congestive Heart Failure (CHF), Sepsis  General Interventions   General Interventions Discussed/Reviewed Durable Medical Equipment (DME), Doctor Visits, Communication with  Durable Medical Equipment (DME) Glucomoter, Oxygen,  Ecologist, Standard wheelchairs in the home  Communication with Social Work, AMB Referral for medical transp.  Exercise Interventions   Exercise Discussed/Reviewed Weight Managment, Physical Activity  Weight Management Weight loss, Weight maintenance  Mental Health Interventions   Mental Health Discussed/Reviewed Refer to Social Work for resources  Nutrition Interventions   Nutrition Discussed/Reviewed Decreasing sugar intake  Pharmacy Interventions   Pharmacy Dicussed/Reviewed Medications and their functions, Medication Adherence  Safety Interventions   Safety Discussed/Reviewed Safety Discussed, Home Safety, Fall Risk  Home Safety Assistive Devices, Need for home safety assessment      Follow  Up Plan:  Telephone follow up appointment with care management team member scheduled for:  10/08/23 AT 10:30 AM WITH Wyline Mood  The patient has been provided with contact information for the care management team and has been advised to call with any health related questions or concerns.   Next PCP appointment scheduled for:  10/04/23 WITH Marcelino Duster Swinyer  Journiee Feldkamp Daphine Deutscher BSN, RN RN Care Manager / Transitions of Care [place] [facility], [facility] Direct Dial Number:  [phone]

## 2023-10-03 ENCOUNTER — Telehealth: Payer: Self-pay | Admitting: Emergency Medicine

## 2023-10-03 NOTE — Telephone Encounter (Signed)
Copied from CRM [id number]. Topic: Clinical - Home Health Verbal Orders >> Oct 03, 2023 10:28 AM Cipriano Bunker wrote: Caller/Agency: [facility] Callback Number: [phone] Service Requested: Occupational Therapy, Physical Therapy, Skilled Nursing-- Referred by Hospital Frequency: N/A Any new concerns about the patient? Yes, Home Health unable to see patient until this Friday 10/05/23, wants to verify if this is ok.

## 2023-10-03 NOTE — Telephone Encounter (Signed)
[facility] verbal order given for PT,OT,Skilled nursing Copied from CRM [id number]. Topic: Clinical - Home Health Verbal Orders >> Oct 03, 2023 10:28 AM Cipriano Bunker wrote: Caller/Agency: [facility] Callback Number: [phone]

## 2023-10-03 NOTE — Progress Notes (Deleted)
Cardiology Office Note:  .   Date:  10/03/2023  ID:  [name] DIJOSEPH, DOB July 25, 1961, MRN [id number] PCP: Sharlene Dory, DO  [facility] Providers Cardiologist:  Armanda Magic, MD    Patient Profile: .      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  She established care with Dr. Mayford Knife 11/2015.  Had recently worn a heart monitor for palpitations which was "normal."  Repeat cath for chest pain 04/2018 which showed severe stenosis of ostial RCA 99% successfully treated with PCI/DES.  Distal details outlined above.  Was placed on DAPT with ASA and Plavix.  Also continued on BB and ARB.  Was referred to lipid clinic for consideration of PCSK9 inhibitor but that was not followed through.  History of A-fib RVR in the setting of sepsis and bacteremia.  She converted to NSR and was placed on Lopressor but no anticoagulation.  Her CHA2DS2-VASc score was 5 but due to brief nature of A-fib in the setting of acute infection and respiratory failure it was recommended to hold off on anticoagulation unless there were further episodes.  Her beta-blocker was changed to Cardizem due to wheezing and asthma.  Event monitor July 2022 showed no evidence of A-fib.   Admission 12/2021 with acute on chronic congestive heart failure with volume overload in the setting of medication noncompliance and left LE cellulitis.   Valsartan was discontinued due to hyperkalemia.  Seen May 2023 by heart failure clinic and was doing well.  She was continued on Lasix 40 mg daily, Farxiga 10 mg daily, and spironolactone 25 mg daily.  Seen 04/25/2022 by Dr. Mayford Knife via telemedicine. She reported DOE but felt it was 2/2 asthma and stable, also recent RA flare. Feels fluttering in her chest but only if she gets anxious. Continued to report statin intolerance. She was referred back to lipid clinic. She was advised to return for in person visit in 6 months.   Seen by me on 04/30/23 accompanied by her husband and traveling in a wheelchair. 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 She reports occasional sharp chest pains in her chest that last for a few seconds. She has chronic shortness of breath and recently more coughing. Feels phlegm moving around in her chest. Difficult to discern if her SOB and chest discomfort are coming from her heart or lungs. Also on multiple medications that she feels may be contributing. Goes to sleep on 2 firm pillows, but sometimes wakes up flat on the bed. Does not specifically describe PND but wakes up multiple times during the night due to pain. Husband has never heard her snoring or gasping. Has chronic cellulitis in the LLE. She only ambulates to and from the bathroom.  We increased her Imdur to 60 mg daily for chest discomfort and was advised to start Repatha, however she called back to report her cholesterol  numbers were within normal limits at PCP and she did not want to start the medication.  Admission 09/2023 for worsening cellulitis of right breast.  She had previously been seen in ED 09/03/2023 and discharged home on cefadroxil for breast cellulitis.  She was found to have sepsis secondary to right breast cellulitis and received IV Rocephin.  Hospital course complicated by progressive hypoxia secondary to diastolic CHF.  She underwent diuresis with removal of 31 L, weight 347 to  283 lb at discharge 10/01/23.  She had normal LVEF on echo, nuclear images of RV.  She underwent RHC 09/26/2023 with normal filling pressures at rest suggesting compensated HFpEF and mild pulmonary hypertension.  SGLT2 inhibitor avoided due to chronic wounds.  She was switched to torsemide 40 mg daily and recommended to undergo sleep study.       History of Present Illness: [name]   [name] is a pleasant 63 y.o. female who is here today for hospital follow-up.   Sleep study BMET    ROS: See HPI       Studies Reviewed: .         Risk Assessment/Calculations:     No BP recorded.  {Refresh Note OR Click here to enter BP  :1}***       Physical Exam:   VS:  There were no vitals taken for this visit.   Wt Readings from Last 3 Encounters:  10/01/23 287 lb 14.7 oz (130.6 kg)  09/10/23 292 lb (132.5 kg)  06/13/23 292 lb (132.5 kg)    GEN: Obese, chronically ill appearing in no acute distress NECK: No JVD; No carotid bruits CARDIAC: Distant heart sounds, RRR, no murmurs, rubs, gallops RESPIRATORY:  Clear to auscultation without rales, wheezing or rhonchi  ABDOMEN: Soft, non-tender, protuberant EXTREMITIES:  Rt AKA, left LE swollen and erythematous with chronic appearing skin changes, partially covered by dressing    ASSESSMENT AND PLAN: .    CAD: History of stents to mid and distal RCA in 2017. DES to ostial RCA in 2019, patent stents with minimal ISR, residual disease as outline above. She is having some chest discomfort and shortness of breath. She is mostly sedentary so difficult to discern if symptoms worsen with exertion. Also has hx of asthma and likely obesity hypoventilation. Chest discomfort is not associated with dyspnea, nausea or diaphoresis. EKG does not reveal any concerning ST changes today. We will try increasing Imdur to 60 mg daily for additional anti-anginal benefit. She did not tolerate beta blocker in the past due to wheezing. I will see her back in 3 months to  consider additional medication titration or further ischemia evaluation.   Shortness of breath: Chronic shortness of breath that she feels is overall stable. Likely multifactorial in the setting of obesity, deconditioning, asthma, CAD and likely obesity hypoventilation. As noted above, we will trial higher dose of nitrates and see her back in soon follow-up.   Dyslipidemia: LDL 112 on 01/08/23. Does not specifically recall symptoms/intolerance of specific statins. She is willing to try PCSK9i. We will check pricing to determine if it is financially feasible. Consider retrial of low dose statin therapy.   Chronic HFpEF: Normal LVEF on echo 05/2021. She has chronic LLE edema. Volume status is difficult to assess due to body habitus. Difficult to discern if she has orthopnea or PND due to frequent waking during the night due to pain. No obvious signs of respiratory distress according to her husband. We will continue Lasix 40 mg daily.  She did not tolerate Farxiga due to yeast infection.   Morbid obesity: BMI of 54 makes management of chronic disease challenging. She is mostly immobile due to hx of right BKA and chronic wounds in LLE. She reports eating smaller portions to try to lose weight. Most meals are cooked by her husband. Encouraged continued efforts towards weight loss and upper body exercise with hand weights.   Hypertension: BP is well controlled.   PAF: She has frequent PVCs on exam. No reoccurrence of a fib to her awareness. She is not on OAC. Continue to monitor for return of a fib. Continue diltiazem.        Dispo: ***  Signed, Eligha Bridegroom, NP-C

## 2023-10-04 ENCOUNTER — Ambulatory Visit: Payer: Commercial Managed Care - HMO | Admitting: Nurse Practitioner

## 2023-10-04 ENCOUNTER — Ambulatory Visit: Payer: Commercial Managed Care - HMO | Admitting: Registered Nurse

## 2023-10-04 ENCOUNTER — Telehealth: Payer: Self-pay | Admitting: *Deleted

## 2023-10-04 NOTE — Telephone Encounter (Signed)
Called patient back and answered all questions about home sleep study, including indications as well as how it pertains to insurance coverage and optimizing treatment. Patient verbalizes understanding and agrees to proceed with test.

## 2023-10-04 NOTE — Telephone Encounter (Signed)
-----   Message from [facility] [name] sent at 09/27/2023  3:42 PM EST ----- Regarding: NEEDS ITAMAR SET UP Patient Calls (Newest Message First) View All Conversations on this Encounter You routed conversation to [employer], RNJust now (3:41 PM)  YouJust now (3:41 PM)   Pt has appt in the office 10/04/23. I will check with Dr.Turner's nurse if ok to set up when pt comes in for her appt with Eligha Bridegroom, NP.   Note  [name] Pucker, RN routed conversation to [place]; Cv Div Sleep Studies1 hour ago (2:40 PM)  [name], RN1 hour ago (2:40 PM)   Itamar ordered, advised patient that someone would call to get her set up with device. Patient verbalizes understanding and agrees to plan.         Patient endorses snoring, daytime fatigue, hypertension, age >50, BMI > 35 kg/m2. STOP Bang score of 5    Note  [name] Pucker, RN  [name], [name] [phone] hour ago (2:36 PM)  [name], RN1 hour ago (2:36 PM)   ----- Message from Armanda Magic sent at 09/26/2023  3:38 PM EST ----- Please get a home sleep study [name]) ----- Message ----- From: Elder Negus, MD Sent: 09/26/2023   1:44 PM EST To: Quintella Reichert, MD; Sharlene Dory, DO   Right heart catheterization 09/26/2023: RA: 5 mmHg RV: 42/0 mmHg PA: 38/20 mmHg, mPAP 28 mmHg PCW: 13 mmHg   AO sats: 87% PA sats: 55%   CO: 5.8 L/min CI: 2.6 L/min/m2   Mild pulmonary hypertension, WHP Grp II Normal filling pressures at rest Compensated HFpEF Persistent hypoxia in spute of normal filling pressures. Consider alternate etiology for hypoxia, such as OSA/OHS

## 2023-10-04 NOTE — Telephone Encounter (Signed)
I s/w the pt today to set up Itamar study. Pt asked why does she need the sleep study if she is already using a sleep device (bipap). I informed the pt that I was following the orders that were given to me. I did state to the pt that I saw notes about snoring, fatigue, HTN. Pt really would like for me to confirm with Dr. Mayford Knife does she really need this home sleep study.   I assured the pt that I will forward to Dr. Malachy Mood nurse to d/w MD and that we will call her back once d/w MD.   Pt thanked me for the call.

## 2023-10-05 NOTE — Progress Notes (Deleted)
Cardiology Office Note:    Date:  10/05/2023   ID:  [name], DOB 28-May-1961, MRN [id number]  PCP:  Sharlene Dory, DO   [facility] Providers Cardiologist:  Armanda Magic, MD { Click to update primary MD,subspecialty MD or APP then REFRESH:1}    Referring MD: Sharlene Dory*   No chief complaint on file. ***  History of Present Illness:    [name] is a 63 y.o. 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  She established care with Dr. Mayford Knife 11/2015.  Had recently worn a heart monitor for palpitations which was "normal."  Repeat cath for chest pain 04/2018 which showed severe stenosis of ostial RCA 99% successfully treated with PCI/DES.  Distal details outlined above.  Was placed on DAPT with ASA and Plavix.  Also continued on BB and ARB.  Was referred to lipid clinic for consideration of PCSK9 inhibitor but that was not followed through.  History of A-fib RVR in the setting of sepsis and bacteremia.  She converted to NSR and was placed on Lopressor but no anticoagulation.  Her CHA2DS2-VASc score was 5 but due to brief nature of A-fib in the setting of acute infection and respiratory failure it was recommended to hold off on anticoagulation unless there were further episodes.  Her beta-blocker was changed to Cardizem due to wheezing and  asthma.  Event monitor July 2022 showed no evidence of A-fib.   Admission 12/2021 with acute on chronic congestive heart failure with volume overload in the setting of medication noncompliance and left LE cellulitis.  Valsartan was discontinued due to hyperkalemia.  Seen May 2023 by heart failure clinic and was doing well.  She was continued on Lasix 40 mg daily, Farxiga 10 mg daily, and spironolactone 25 mg daily.  Seen 04/25/2022 by Dr. Mayford Knife via telemedicine. She reported DOE but felt it was 2/2 asthma and stable, also recent RA flare. Feels fluttering in her chest but only if she gets anxious. Continued to report statin intolerance. She was referred back to lipid clinic. She was advised to return for in person visit in 6 months.   Seen by me on 04/30/23 accompanied by her husband and traveling in a wheelchair. 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 She reports occasional sharp chest pains in her chest that last for a few seconds. She has chronic shortness of breath and recently more coughing. Feels phlegm moving around in her chest. Difficult to discern if her SOB and chest discomfort are coming from her heart or lungs. Also on multiple medications that she feels may be contributing. Goes to sleep on 2 firm pillows, but sometimes wakes up flat on the bed. Does not specifically describe PND but wakes up multiple times during the night due to pain. Husband has never heard  her snoring or gasping. Has chronic cellulitis in the LLE. She only ambulates to and from the bathroom.  We increased her Imdur to 60 mg daily for chest discomfort and was advised to start Repatha, however she called back to report her cholesterol numbers were within normal limits at PCP and she did not want to start the medication.  Admission 09/2023 for worsening cellulitis of right breast.  She had previously been seen in ED 09/03/2023 and discharged home on cefadroxil for breast cellulitis.  She was found  to have sepsis secondary to right breast cellulitis and received IV Rocephin.  Hospital course complicated by progressive hypoxia secondary to diastolic CHF.  She underwent diuresis with removal of 31 L, weight 347 to 283 lb at discharge 10/01/23.  She had normal LVEF on echo, nuclear images of RV.  She underwent RHC 09/26/2023 with normal filling pressures at rest suggesting compensated HFpEF and mild pulmonary hypertension.  SGLT2 inhibitor avoided due to chronic wounds.  She was switched to torsemide 40 mg daily and recommended to undergo sleep study.    CAD: History of stents to mid and distal RCA in 2017. DES to ostial RCA in 2019, patent stents with minimal ISR, residual disease as outline above. She is having some chest discomfort and shortness of breath. She is mostly sedentary so difficult to discern if symptoms worsen with exertion. Also has hx of asthma and likely obesity hypoventilation. Chest discomfort is not associated with dyspnea, nausea or diaphoresis. EKG does not reveal any concerning ST changes today. We will try increasing Imdur to 60 mg daily for additional anti-anginal benefit. She did not tolerate beta blocker in the past due to wheezing. I will see her back in 3 months to consider additional medication titration or further ischemia evaluation.   Shortness of breath: Chronic shortness of breath that she feels is overall stable. Likely multifactorial in the setting of obesity, deconditioning, asthma, CAD and likely obesity hypoventilation. As noted above, we will trial higher dose of nitrates and see her back in soon follow-up.   Dyslipidemia: LDL 112 on 01/08/23. Does not specifically recall symptoms/intolerance of specific statins. She is willing to try PCSK9i. We will check pricing to determine if it is financially feasible. Consider retrial of low dose statin therapy.   Chronic HFpEF: Normal LVEF on echo 05/2021. She has chronic LLE edema. Volume status is difficult to assess due to  body habitus. Difficult to discern if she has orthopnea or PND due to frequent waking during the night due to pain. No obvious signs of respiratory distress according to her husband. We will continue Lasix 40 mg daily. She did not tolerate Farxiga due to yeast infection.   Morbid obesity: BMI of 54 makes management of chronic disease challenging. She is mostly immobile due to hx of right BKA and chronic wounds in LLE. She reports eating smaller portions to try to lose weight. Most meals are cooked by her husband. Encouraged continued efforts towards weight loss and upper body exercise with hand weights.   Hypertension: BP is well controlled.   PAF: She has frequent PVCs on exam. No reoccurrence of a fib to her awareness. She is not on OAC. Continue to monitor for return of a fib. Continue diltiazem.   Past Medical History:  Diagnosis Date   Aortic stenosis    mild AS by echo 02/2021   Asthma    "on daily RX and rescue inhaler" (04/18/2018)   Chronic diastolic CHF (congestive heart failure) (HCC)  09/2015   Chronic lower back pain    Chronic neck pain    Chronic pain syndrome    Fentanyl Patch   Colon polyps    Coronary artery disease    cath with normal LM, 30% LAD, 85% mid RCA and 95% distal RCA s/p PCI of the mid to distal RCA and now on DAPT with ASA and Ticagrelor.     Eczema    Excessive daytime sleepiness 11/26/2015   Fibromyalgia    Gallstones    GERD (gastroesophageal reflux disease)    Heart murmur    "noted for the 1st time on 04/18/2018"   History of blood transfusion 07/2010   "S/P oophorectomy"   History of gout    History of hiatal hernia 1980s   "gone now" (04/18/2018)   Hyperlipidemia    Hypertension    takes Metoprolol and Enalapril daily   Hypothyroidism    takes Synthroid daily   IBS (irritable bowel syndrome)    Migraine    "nothing in the 2000s" (04/18/2018)   Mixed connective tissue disease (HCC)    NAFLD (nonalcoholic fatty liver disease)    Pneumonia     "several times" (04/18/2018)   PVC's (premature ventricular contractions)    noted on event monitor 02/2021   Rheumatoid arthritis (HCC)    "hands, elbows, shoulders, probably knees" (04/18/2018)   Scoliosis    Sjogren's syndrome (HCC)    Sleep apnea    mild - does not use cpap   Spondylosis    Type II diabetes mellitus (HCC)    takes Metformin and Hum R daily (04/18/2018)   Walker as ambulation aid    also uses wheelchair    Past Surgical History:  Procedure Laterality Date   ABDOMINAL HYSTERECTOMY  06/2005   "w/right ovariy"   AMPUTATION Right 01/28/2020    RIGHT FOURTH AND FIFTH RAY AMPUTATION   AMPUTATION Right 01/28/2020   Procedure: RIGHT FOURTH AND FIFTH RAY AMPUTATION,;  Surgeon: [name], MD;  Location: [facility] OR;  Service: Orthopedics;  Laterality: Right;   AMPUTATION Right 06/01/2021   Procedure: RIGHT BELOW KNEE AMPUTATION;  Surgeon: [name], MD;  [facility] OR;  Service: Orthopedics;  Laterality: Right;   AMPUTATION Right 07/22/2021   Procedure: RIGHT ABOVE KNEE AMPUTATION;  Surgeon: [name], MD;  Location: [facility] OR;  Service: Orthopedics;  Laterality: Right;   APPENDECTOMY     BREAST BIOPSY Bilateral    9 total (04/18/2018)   CORONARY ANGIOPLASTY WITH STENT PLACEMENT  10/01/2015   normal LM, 30% LAD, 85% mid RCA and 95% distal RCA s/p PCI of the mid to distal RCA and now on DAPT with ASA and Ticagrelor.     CORONARY STENT INTERVENTION Right 04/18/2018   Procedure: CORONARY STENT INTERVENTION;  Surgeon: [name], MD;  Location: [facility] INVASIVE CV LAB;  Service: Cardiovascular;  Laterality: Right;   DILATION AND CURETTAGE OF UTERUS     FRACTURE SURGERY     LAPAROSCOPIC CHOLECYSTECTOMY     LEFT HEART CATH AND CORONARY ANGIOGRAPHY N/A 04/18/2018   Procedure: LEFT HEART CATH AND CORONARY ANGIOGRAPHY;  Surgeon: [name], MD;  Location: [facility] INVASIVE CV LAB;  Service: Cardiovascular;  Laterality: N/A;   MUSCLE BIOPSY Left    "leg"    OOPHORECTOMY  07/2010   RADIOLOGY WITH ANESTHESIA N/A 05/25/2016   Procedure: RADIOLOGY WITH ANESTHESIA;  Surgeon: Medication Radiologist, MD;  Location: [facility] OR;  Service: Radiology;  Laterality: N/A;   RIGHT HEART  CATH N/A 09/26/2023   Procedure: RIGHT HEART CATH;  Surgeon: Elder Negus, MD;  Location: [facility] INVAS[facility] CV LAB;  Service: Cardiovascular;  Laterality: N/A;   STUMP REVISION Right 06/29/2021   Procedure: REVISION RIGHT BELOW KNEE AMPUTATION;  Surgeon: [name], MD;  Location[facility] OR;  Service: Orthopedics;  Laterality: Right;   TEE WITHOUT CARDIOVERSION N/A 06/01/2021   Procedure: TRANSESOPHAGEAL ECHOCARDIOGRAM (TEE);  Surgeon: Sande Rives, MD;  Location: [facility] OR;  Service: Cardiovascular;  Laterality: N/A;   TONSILLECTOMY AND ADENOIDECTOMY     WRIST FRACTURE SURGERY Left    "crushed it"    Current Medications: No outpatient medications have been marked as taking for the 10/10/23 encounter (Appointment) with Filbert Schilder, NP.     Allergies:   Fish allergy, Fish oil, Gabapentin, Ipratropium-albuterol, Omega-3 fatty acids, Peanut-containing drug products, Victoza [liraglutide], Lovastatin, Metronidazole, Penicillins, Red yeast rice [cholestin], Rosuvastatin, Rotigotine, Atrovent hfa [ipratropium bromide hfa], Iodine, Ipratropium bromide, Morphine, Other, Pepto-bismol [bismuth subsalicylate], Wound dressing adhesive, Enalapril, Metoprolol, Suprep [na sulfate-k sulfate-mg sulf], and Tape   Social History   Socioeconomic History   Marital status: Married    Spouse name: [name] Knecht   Number of children: 1   Years of education: 11   Highest education level: Associate degree: occupational, Scientist, product/process development, or vocational program  Occupational History    Comment: Not currrently working ( last worked 2004)  Tobacco Use   Smoking status: Former    Current packs/day: 0.00    Average packs/day: 1 pack/day for 19.4 years (19.4 ttl pk-yrs)    Types: Cigarettes    Start  date: 82    Quit date: 02/11/2004    Years since quitting: 19.6    Passive exposure: Past   Smokeless tobacco: Never  Vaping Use   Vaping status: Never Used  Substance and Sexual Activity   Alcohol use: Yes    Comment: RARE Wine   Drug use: Not Currently    Types: Marijuana    Comment: "only in my teens"   Sexual activity: Yes    Birth control/protection: Surgical    Comment: Hysterectomy  Other Topics Concern   Not on file  Social History Narrative   Not on file   Social Drivers of Health   Financial Resource Strain: Medium Risk (09/10/2023)   Overall Financial Resource Strain (CARDIA)    Difficulty of Paying Living Expenses: Somewhat hard  Food Insecurity: No Food Insecurity (10/02/2023)   Hunger Vital Sign    Worried About Running Out of Food in the Last Year: Never true    Ran Out of Food in the Last Year: Never true  Transportation Needs: Unmet Transportation Needs (10/02/2023)   PRAPARE - Transportation    Lack of Transportation (Medical): Yes    Lack of Transportation (Non-Medical): Yes  Physical Activity: Unknown (09/10/2023)   Exercise Vital Sign    Days of Exercise per Week: 0 days    Minutes of Exercise per Session: Not on file  Stress: No Stress Concern Present (09/10/2023)   [employer] of Occupational Health - Occupational Stress Questionnaire    Feeling of Stress : Not at all  Social Connections: Socially Isolated (09/10/2023)   Social Connection and Isolation Panel [NHANES]    Frequency of Communication with Friends and Family: Once a week    Frequency of Social Gatherings with Friends and Family: Never    Attends Religious Services: Never    Database administrator or Organizations: No    Attends [employer]  or Organization Meetings: Not on file    Marital Status: Married     Family History: The patient's ***family history includes Allergic rhinitis in her father and son; Asthma in her father and son; Breast cancer in her paternal aunt; CAD in her  father and mother; Heart attack in her father and mother; Hypertension in her brother, brother, and mother; Lung cancer in her paternal aunt; Pancreatic cancer in her paternal aunt; Stroke in her mother.  ROS:   Please see the history of present illness.    *** All other systems reviewed and are negative.  EKGs/Labs/Other Studies Reviewed:    The following studies were reviewed today: ***      Recent Labs: 09/12/2023: B Natriuretic Peptide 106.8 09/23/2023: ALT 21 09/27/2023: TSH 4.951 10/01/2023: BUN 24; Creatinine, Ser 1.09; Hemoglobin 11.8; Magnesium 1.8; Platelets 349; Potassium 3.5; Sodium 137  Recent Lipid Panel    Component Value Date/Time   CHOL 147 05/08/2023 0953   CHOL 176 05/04/2020 0947   TRIG 142.0 05/08/2023 0953   HDL 39.10 05/08/2023 0953   HDL 41 05/04/2020 0947   CHOLHDL 4 05/08/2023 0953   VLDL 28.4 05/08/2023 0953   LDLCALC 79 05/08/2023 0953   LDLCALC 83 05/26/2022 1501   LDLDIRECT 121.0 10/27/2020 0809     Risk Assessment/Calculations:   {Does this patient have ATRIAL FIBRILLATION?:[phone]}  No BP recorded.  {Refresh Note OR Click here to enter BP  :1}***         Physical Exam:    VS:  There were no vitals taken for this visit.    Wt Readings from Last 3 Encounters:  10/01/23 287 lb 14.7 oz (130.6 kg)  09/10/23 292 lb (132.5 kg)  06/13/23 292 lb (132.5 kg)     GEN: *** Well nourished, well developed in no acute distress HEENT: Normal NECK: No JVD; No carotid bruits LYMPHATICS: No lymphadenopathy CARDIAC: ***RRR, no murmurs, rubs, gallops RESPIRATORY:  Clear to auscultation without rales, wheezing or rhonchi  ABDOMEN: Soft, non-tender, non-distended MUSCULOSKELETAL:  No edema; No deformity  SKIN: Warm and dry NEUROLOGIC:  Alert and oriented x 3 PSYCHIATRIC:  Normal affect   ASSESSMENT:    No diagnosis found. PLAN:    In order of problems listed above:  ***      {Are you ordering a CV Procedure (e.g. stress test, cath, DCCV,  TEE, etc)?   Press F2        :[id number]}    Medication Adjustments/Labs and Tests Ordered: Current medicines are reviewed at length with the patient today.  Concerns regarding medicines are outlined above.  No orders of the defined types were placed in this encounter.  No orders of the defined types were placed in this encounter.   There are no Patient Instructions on file for this visit.   Signed, Georgie Chard, NP  10/05/2023 12:13 PM    [facility]

## 2023-10-08 ENCOUNTER — Telehealth: Payer: Self-pay | Admitting: Neurology

## 2023-10-08 ENCOUNTER — Telehealth: Payer: Self-pay

## 2023-10-08 NOTE — Telephone Encounter (Signed)
Copied from CRM [id number]. Topic: Clinical - Home Health Verbal Orders >> Oct 08, 2023  3:48 PM Kathryne Eriksson wrote: Caller/Agency: Will '[facility]"  Callback Number: [phone] Service Requested: Occupational Therapy Frequency: 1 WEEK 1 , 2 WEEK 3  Any new concerns about the patient? No

## 2023-10-08 NOTE — Patient Outreach (Signed)
Care Management  Transitions of Care Program Transitions of Care Post-discharge week 2   10/08/2023 Name: [name] MRN: [id number] DOB: [birth year]  Subjective: [name] is a 63 y.o. year old female who is a primary care patient of [name] Dory, DO. The Care Management team Engaged with patient by telephone to assess and address transitions of care needs.   Consent to Services:  Patient was given information about care management services, agreed to services, and gave verbal consent to participate.   Assessment:   Patient reports she is slowly recovering from her recent hospitalization.  Today she denies any chest pain, shortness of breath, swelling, fever, chills or increase in pain level.  Patient is currently non ambulatory and is unable to transfer from bed to chair/wheelchair.  Patient reports at her prior level of function she was able to transfer and used a wheelchair for mobility. [facility] is providing physical, occupational therapy and skilled nursing services.  Patient states they had not yet discussed with her whether she would be receiving aid services to assist with activities of daily living.  RNCM to make outreach to [facility] Agency referral contact re: evaluating the patient for aid services and if she has benefit coverage for these services.  Patient's spouse, who works during the day, provides assistance as needed with her care needs.  Patient has 2 doctor appointments this week and states she is unable to go by private vehicle or handicapped accessible vehicle as she is currently not able to transfer.  [facility] Physiological scientist) brought the patient home from the hospital.  Discussed with patient contacting [facility] to see if they are able to provide her with transportation and if so, what the cost would be to her.  RNCM made outreach to a Psychologist, educational during our call to obtain information regarding the steps  required and if the patient could initiate a request for [facility] transport herself.  Information shared with [name] including [facility] phone number to contact them today regarding her upcoming appointments.  Additionally, I recommended she contact her doctor's to see if a virtual/video appointment is an option for her should she be unable to secure transportation.  Patient verbalized understanding.       SDOH Interventions    Flowsheet Row Telephone from 10/02/2023 in [place] POPULATION HEALTH DEPARTMENT ED to Hosp-Admission (Discharged) from 09/10/2023 in [place] LONG 4TH FLOOR PROGRESSIVE CARE AND UROLOGY ED to Hosp-Admission (Discharged) from 06/14/2022 in [facility] 5 NORTH ORTHOPEDICS ED to Hosp-Admission (Discharged) from 12/20/2021 in [facility] 50M KIDNEY UNIT Telephone from 08/30/2021 in [facility] Primary Care at [facility] Chronic Care Management from 08/25/2021 in [facility] Primary Care at [facility]  SDOH Interventions        Food Insecurity Interventions Intervention Not Indicated -- -- Intervention Not Indicated Other (Comment)  [Emailed list of food pantries] Assist with [employer], Stage manager ([facility] only), Other (Comment)  [Referral to [employer] Care Guide]  Housing Interventions Intervention Not Indicated, Inpatient TOC Intervention Not Indicated, Inpatient TOC  [Patient currently has housing.] Intervention Not Indicated Intervention Not Indicated -- Other (Comment)  [Referral to [facility] Care Guide]  Transportation Interventions AMB Referral Inpatient TOC, Intervention Not Indicated  [Patient reported she currently has a Merchant navy officer for transportation.] -- -- -- Intervention Not Indicated  Utilities Interventions Intervention Not Indicated -- -- -- -- --  Financial Strain  Interventions -- -- -- Intervention Not Indicated Other (Comment)  [Patient was mailed disability, medicaid, lieap, snap, food pantry  information let patient know she can apply for these programs by phone as an option.] Artist, Other (Comment)  [Referral to [employer] Guide]  Physical Activity Interventions -- -- -- -- -- Patient Refused  Stress Interventions -- -- -- -- -- Live Life Well, Offered [employer], Provide Counseling, Other (Comment)  [Financial Resources Provided]  Social Connections Interventions -- -- -- -- -- Intervention Not Indicated        Goals Addressed             This Visit's Progress    transition of care        TRANSITION OF CARE  Current Barriers:  Medication management  Diet/Nutrition/Food Resources  Provider appointments  Home Health services  Transportation - sdoh Knowledge Deficits related to plan of care for management of Cellulitis/Sepsis   RNCM Clinical Goal(s):  Patient will work with the Care Management team over the next 30 days to address Transition of Care Barriers: Medication Management Diet/Nutrition/Food Resources Provider appointments Home Health services - [facility] PT,OT, RN.  Outreach to home health agency to assess for home health aid services Equipment/DME - Safe use AE in the home Functional/Safety- [facility] PT/OT to safely address patient's mobility and self care needs Transportation - AMB referral to SW for medical transportation                                          verbalize basic understanding of  Cellulitis/sepsis infectious process and self health management plan.  take all medications exactly as prescribed and will call provider for medication related questions as evidenced by review of EMR demonstrate understanding of rationale for each prescribed medication as evidenced by patient verb. Understanding medication education attend all scheduled medical appointments: PCP and Specialist as evidenced by  through collaboration with RN Care manager, provider, and care team.   Interventions: Evaluation of current  treatment plan related to  self management and patient's adherence to plan as established by provider  Transitions of Care:  Goal on track:  Yes. Community Resource Referral Made to SW to address Transportation, and safety in the home due to decreased Functional Status after recent hospitalization. Patient reports decline in functional status since most recent illness.  Subacute/SNF/post-acute care was recommended to the patient prior to discharge from the hospital; essentially bed bound and is home alone when spouse goes to work.  Patient declined subacute/skilled nursing facility rehabilitation services, choosing to go home with home health services.   Contacted [facility]- To have patient assessed for possible aid services in the home Reviewed Signs and symptoms of infection -Patient is able to verbalize signs and symptoms of infection; denies any signs/symptoms of infection today. Reviewed/discussed home foley catheter care.  Verbal patient education to always wash hands thoroughly before handling the catheter,  keep the drainage bag below bladder level and not touching the floor to help decrease the possibility of introducing bacteria that may cause bladder/urinary tract infections.  Patient verbalized understanding.  Patient Goals/[name] Activities: Participate in Transition of Care Program/Attend [facility] scheduled calls Notify RN Care Manager of TOC call rescheduling needs Take all medications as prescribed Attend all scheduled provider appointments Call provider office for new concerns or questions   Follow Up Plan:  Telephone  follow up appointment with care management team member scheduled for:  10/12/23 AT 10:30 AM The patient has been provided with contact information for the care management team and has been advised to call with any health related questions or concerns.          Plan: Telephone follow up appointment with care management team member scheduled for:  10/12/2023 at 10:30 AM.  Wyline Mood BSN, RN RN Care Manager / Transitions of Care [facility], [facility] Direct Dial Number:  [phone]

## 2023-10-09 NOTE — Telephone Encounter (Signed)
Called Home Health  verbal order given OT  Home Health Verbal Orders >> Oct 08, 2023  3:48 PM Kathryne Eriksson wrote: Caller/Agency: Will '[facility]"  Callback Number: [phone] Service Requested: Occupational Therapy Frequency: 1 WEEK 1 , 2 WEEK 3.

## 2023-10-10 ENCOUNTER — Telehealth: Payer: Self-pay | Admitting: Family Medicine

## 2023-10-10 ENCOUNTER — Encounter: Payer: Commercial Managed Care - HMO | Attending: Registered Nurse | Admitting: Registered Nurse

## 2023-10-10 ENCOUNTER — Telehealth (INDEPENDENT_AMBULATORY_CARE_PROVIDER_SITE_OTHER): Payer: Commercial Managed Care - HMO | Admitting: Family Medicine

## 2023-10-10 ENCOUNTER — Ambulatory Visit: Payer: Commercial Managed Care - HMO | Admitting: Cardiology

## 2023-10-10 ENCOUNTER — Telehealth: Payer: Self-pay | Admitting: Registered Nurse

## 2023-10-10 ENCOUNTER — Encounter: Payer: Self-pay | Admitting: Family Medicine

## 2023-10-10 ENCOUNTER — Inpatient Hospital Stay: Payer: Commercial Managed Care - HMO | Admitting: Family Medicine

## 2023-10-10 DIAGNOSIS — I5032 Chronic diastolic (congestive) heart failure: Secondary | ICD-10-CM | POA: Diagnosis not present

## 2023-10-10 DIAGNOSIS — Z794 Long term (current) use of insulin: Secondary | ICD-10-CM

## 2023-10-10 DIAGNOSIS — E1165 Type 2 diabetes mellitus with hyperglycemia: Secondary | ICD-10-CM

## 2023-10-10 DIAGNOSIS — Z978 Presence of other specified devices: Secondary | ICD-10-CM

## 2023-10-10 DIAGNOSIS — E876 Hypokalemia: Secondary | ICD-10-CM

## 2023-10-10 MED ORDER — APIXABAN 5 MG PO TABS: 5.0000 mg | ORAL_TABLET | Freq: Two times a day (BID) | ORAL | 0 refills | Status: DC

## 2023-10-10 MED ORDER — NALOXONE HCL 4 MG/0.1ML NA LIQD: 1.0000 | NASAL | 2 refills | Status: AC | PRN

## 2023-10-10 MED ORDER — OXYCODONE HCL 20 MG PO TABS: 1.0000 | ORAL_TABLET | Freq: Three times a day (TID) | ORAL | 0 refills | Status: DC | PRN

## 2023-10-10 MED ORDER — DICLOFENAC SODIUM 2 % EX SOLN: 2.0000 | Freq: Two times a day (BID) | CUTANEOUS | 2 refills | Status: DC | PRN

## 2023-10-10 MED ORDER — MUPIROCIN 2 % EX OINT: TOPICAL_OINTMENT | Freq: Two times a day (BID) | CUTANEOUS | 1 refills | Status: DC

## 2023-10-10 MED ORDER — NITROGLYCERIN 0.4 MG SL SUBL: 0.4000 mg | SUBLINGUAL_TABLET | SUBLINGUAL | 5 refills | Status: AC | PRN

## 2023-10-10 MED ORDER — FUROSEMIDE 40 MG PO TABS: 40.0000 mg | ORAL_TABLET | Freq: Every day | ORAL | 2 refills | Status: DC

## 2023-10-10 MED ORDER — GLUCAGON EMERGENCY 1 MG IJ KIT: 1.0000 mg | PACK | Freq: Every day | INTRAMUSCULAR | 1 refills | Status: AC | PRN

## 2023-10-10 MED ORDER — DILTIAZEM HCL ER COATED BEADS 360 MG PO CP24: 360.0000 mg | ORAL_CAPSULE | Freq: Every day | ORAL | 5 refills | Status: DC

## 2023-10-10 NOTE — Telephone Encounter (Signed)
Yes

## 2023-10-10 NOTE — Progress Notes (Signed)
Chief Complaint  Patient presents with   Hospitalization Follow-up    Cellulitis of lower left extremity, pt states starting to having swelling again.     Subjective: Patient is a 63 y.o. female here for f/u. We are interacting via web portal for an electronic face-to-face visit. I verified patient's ID using 2 identifiers. Patient agreed to proceed with visit via this method. Patient is at home, I am at office. Patient, her husband [name], and I are present for visit.   Patient was in the hospital from 09/10/2023 and discharged on 10/01/2023.  She was treated for cellulitis.  Antibiotic therapy finished on 09/17/2023.  I switched her furosemide to torsemide.  Renal function was stable upon discharge.  Prior to changing it, home furosemide was effective at keeping her swelling down.  They told her to stop it and she has not been able to get the new diuretic due to insurance reasons.  She has started to swell again.  No shortness of breath or new chest pain.  Patient has a history of an AKA and is circulatory issues.  She also has mobility issues due to this.  She has a chronic indwelling Foley currently.  This was maintained while in the hospital.  She would prefer to avoid seeing a urologist due to transportation concerns.  The social work team has been in touch with her.  She is asking if the home health team can change out her Foley catheter.  No urinary complaints currently.  Past Medical History:  Diagnosis Date   Aortic stenosis    mild AS by echo 02/2021   Asthma    "on daily RX and rescue inhaler" (04/18/2018)   Chronic diastolic CHF (congestive heart failure) (HCC) 09/2015   Chronic lower back pain    Chronic neck pain    Chronic pain syndrome    Fentanyl Patch   Colon polyps    Coronary artery disease    cath with normal LM, 30% LAD, 85% mid RCA and 95% distal RCA s/p PCI of the mid to distal RCA and now on DAPT with ASA and Ticagrelor.     Eczema    Excessive daytime sleepiness  11/26/2015   Fibromyalgia    Gallstones    GERD (gastroesophageal reflux disease)    Heart murmur    "noted for the 1st time on 04/18/2018"   History of blood transfusion 07/2010   "S/P oophorectomy"   History of gout    History of hiatal hernia 1980s   "gone now" (04/18/2018)   Hyperlipidemia    Hypertension    takes Metoprolol and Enalapril daily   Hypothyroidism    takes Synthroid daily   IBS (irritable bowel syndrome)    Migraine    "nothing in the 2000s" (04/18/2018)   Mixed connective tissue disease (HCC)    NAFLD (nonalcoholic fatty liver disease)    Pneumonia    "several times" (04/18/2018)   PVC's (premature ventricular contractions)    noted on event monitor 02/2021   Rheumatoid arthritis (HCC)    "hands, elbows, shoulders, probably knees" (04/18/2018)   Scoliosis    Sjogren's syndrome (HCC)    Sleep apnea    mild - does not use cpap   Spondylosis    Type II diabetes mellitus (HCC)    takes Metformin and Hum R daily (04/18/2018)   Walker as ambulation aid    also uses wheelchair    Objective: There were no vitals taken for this visit. General: Awake, appears  stated age Heart: RRR, no LE edema Lungs: CTAB, no rales, wheezes or rhonchi. No accessory muscle use Psych: Age appropriate judgment and insight, normal affect and mood  Assessment and Plan: Chronic heart failure with preserved ejection fraction (HCC) - Plan: furosemide (LASIX) 40 MG tablet  Type 2 diabetes mellitus with hyperglycemia, with long-term current use of insulin (HCC) - Plan: Glucagon, rDNA, (GLUCAGON EMERGENCY) 1 MG KIT  Hypokalemia - Plan: Basic Metabolic Panel (BMET)  Foley catheter in place  Restart Lasix 40 mg daily.  Check BMP next week.  She reports that her home health team may be able to do this.  That would be great from a convenience standpoint.  Elevate legs when able. Glucagon refilled. BMP as above. Order placed to home health to exchange her catheter.  Offered referral to urology  which she politely declined. The patient voiced understanding and agreement to the plan.  I spent 34 minutes with the patient discussing the above plan in addition to reviewing her chart on the same day of the visit.  Jilda Roche [place], DO 10/10/23  5:11 PM

## 2023-10-10 NOTE — Telephone Encounter (Signed)
PMP was Reviewed.  [name] was admitted to [facility] 09/10/2023 and discharged on 10/01/2023, discharge summary was reviewed.  Her appointment was changed to 11/05/2023, to give her time to work on her Transportation, she verbalizes understanding.  Oxycodone prescription was sent to pharmacy, she verbalizes understanding.

## 2023-10-10 NOTE — Telephone Encounter (Signed)
Patient will be out of medication and she rescheduled next Wednesday 10/17/23.

## 2023-10-10 NOTE — Telephone Encounter (Signed)
[name] is calling in regarding the patient the patient stated that she is bed bound at the moment and may need additional help

## 2023-10-10 NOTE — Telephone Encounter (Signed)
Copied from CRM [id number]. Topic: Clinical - Home Health Verbal Orders >> Oct 10, 2023  2:36 PM Bo Mcclintock wrote: Caller/Agency: [facility] Callback Number: [phone] Service Requested: Physical Therapy Frequency: 2x a week for 4 weeks then 1x a week for 3 weeks Any new concerns about the patient? No

## 2023-10-11 ENCOUNTER — Telehealth: Payer: Self-pay | Admitting: Cardiology

## 2023-10-11 NOTE — Telephone Encounter (Signed)
Called pt and informed of NP advisement. Pt reports that she cannot make appt Monday, and cancelled it.  States she has to use an ambulance currently for transport as she does not have use of her left leg currently and cannot walk/use wheelchair.  She cannot afford ambulance ride of $1200.00 to come to a doctors appt. Said she could reschedule to see Noreene Larsson in a couple of weeks. Aware I will send to scheduling to call her and arrange.

## 2023-10-11 NOTE — Telephone Encounter (Signed)
VO given.

## 2023-10-11 NOTE — Progress Notes (Deleted)
 Cardiology Office Note:    Date:  10/11/2023   ID:  [name], DOB 1960-10-10, MRN [id number]  PCP:  Sharlene Dory, DO   [facility] Providers Cardiologist:  Armanda Magic, MD { Click to update primary MD,subspecialty MD or APP then REFRESH:1}    Referring MD: Sharlene Dory*   No chief complaint on file. ***  History of Present Illness:    [name] is a 63 y.o. 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  She established care with Dr. Mayford Knife 11/2015.  Had recently worn a heart monitor for palpitations which was "normal."  Repeat cath for chest pain 04/2018 which showed severe stenosis of ostial RCA 99% successfully treated with PCI/DES.  Distal details outlined above.  Was placed on DAPT with ASA and Plavix.  Also continued on BB and ARB.  Was referred to lipid clinic for consideration of PCSK9 inhibitor but that was not followed through.  History of A-fib RVR in the setting of sepsis and bacteremia.  She converted to NSR and was placed on Lopressor but no anticoagulation.  Her CHA2DS2-VASc score was 5 but due to brief nature of A-fib in the setting of acute infection and respiratory failure it was recommended to hold off on anticoagulation unless there were further episodes.  Her beta-blocker was changed to Cardizem due to wheezing and  asthma.  Event monitor July 2022 showed no evidence of A-fib.   Admission 12/2021 with acute on chronic congestive heart failure with volume overload in the setting of medication noncompliance and left LE cellulitis.  Valsartan was discontinued due to hyperkalemia.  Seen May 2023 by heart failure clinic and was doing well.  She was continued on Lasix 40 mg daily, Farxiga 10 mg daily, and spironolactone 25 mg daily.  Seen 04/25/2022 by Dr. Mayford Knife via telemedicine. She reported DOE but felt it was 2/2 asthma and stable, also recent RA flare. Feels fluttering in her chest but only if she gets anxious. Continued to report statin intolerance. She was referred back to lipid clinic. She was advised to return for in person visit in 6 months.   Seen by me on 04/30/23 accompanied by her husband and traveling in a wheelchair. 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 She reports occasional sharp chest pains in her chest that last for a few seconds. She has chronic shortness of breath and recently more coughing. Feels phlegm moving around in her chest. Difficult to discern if her SOB and chest discomfort are coming from her heart or lungs. Also on multiple medications that she feels may be contributing. Goes to sleep on 2 firm pillows, but sometimes wakes up flat on the bed. Does not specifically describe PND but wakes up multiple times during the night due to pain. Husband has never heard  her snoring or gasping. Has chronic cellulitis in the LLE. She only ambulates to and from the bathroom.  We increased her Imdur to 60 mg daily for chest discomfort and was advised to start Repatha, however she called back to report her cholesterol numbers were within normal limits at PCP and she did not want to start the medication.  Admission 09/2023 for worsening cellulitis of right breast.  She had previously been seen in ED 09/03/2023 and discharged home on cefadroxil for breast cellulitis.  She was found  to have sepsis secondary to right breast cellulitis and received IV Rocephin.  Hospital course complicated by progressive hypoxia secondary to diastolic CHF.  She underwent diuresis with removal of 31 L, weight 347 to 283 lb at discharge 10/01/23.  She had normal LVEF on echo, nuclear images of RV.  She underwent RHC 09/26/2023 with normal filling pressures at rest suggesting compensated HFpEF and mild pulmonary hypertension.  SGLT2 inhibitor avoided due to chronic wounds.  She was switched to torsemide 40 mg daily and recommended to undergo sleep study.    CAD: History of stents to mid and distal RCA in 2017. DES to ostial RCA in 2019, patent stents with minimal ISR, residual disease as outline above. She is having some chest discomfort and shortness of breath. She is mostly sedentary so difficult to discern if symptoms worsen with exertion. Also has hx of asthma and likely obesity hypoventilation. Chest discomfort is not associated with dyspnea, nausea or diaphoresis. EKG does not reveal any concerning ST changes today. We will try increasing Imdur to 60 mg daily for additional anti-anginal benefit. She did not tolerate beta blocker in the past due to wheezing. I will see her back in 3 months to consider additional medication titration or further ischemia evaluation.   Shortness of breath: Chronic shortness of breath that she feels is overall stable. Likely multifactorial in the setting of obesity, deconditioning, asthma, CAD and likely obesity hypoventilation. As noted above, we will trial higher dose of nitrates and see her back in soon follow-up.   Dyslipidemia: LDL 112 on 01/08/23. Does not specifically recall symptoms/intolerance of specific statins. She is willing to try PCSK9i. We will check pricing to determine if it is financially feasible. Consider retrial of low dose statin therapy.   Chronic HFpEF: Normal LVEF on echo 05/2021. She has chronic LLE edema. Volume status is difficult to assess due to  body habitus. Difficult to discern if she has orthopnea or PND due to frequent waking during the night due to pain. No obvious signs of respiratory distress according to her husband. We will continue Lasix 40 mg daily. She did not tolerate Farxiga due to yeast infection.   Morbid obesity: BMI of 54 makes management of chronic disease challenging. She is mostly immobile due to hx of right BKA and chronic wounds in LLE. She reports eating smaller portions to try to lose weight. Most meals are cooked by her husband. Encouraged continued efforts towards weight loss and upper body exercise with hand weights.   Hypertension: BP is well controlled.   PAF: She has frequent PVCs on exam. No reoccurrence of a fib to her awareness. She is not on OAC. Continue to monitor for return of a fib. Continue diltiazem.   Past Medical History:  Diagnosis Date   Aortic stenosis    mild AS by echo 02/2021   Asthma    "on daily RX and rescue inhaler" (04/18/2018)   Chronic diastolic CHF (congestive heart failure) (HCC)  09/2015   Chronic lower back pain    Chronic neck pain    Chronic pain syndrome    Fentanyl Patch   Colon polyps    Coronary artery disease    cath with normal LM, 30% LAD, 85% mid RCA and 95% distal RCA s/p PCI of the mid to distal RCA and now on DAPT with ASA and Ticagrelor.     Eczema    Excessive daytime sleepiness 11/26/2015   Fibromyalgia    Gallstones    GERD (gastroesophageal reflux disease)    Heart murmur    "noted for the 1st time on 04/18/2018"   History of blood transfusion 07/2010   "S/P oophorectomy"   History of gout    History of hiatal hernia 1980s   "gone now" (04/18/2018)   Hyperlipidemia    Hypertension    takes Metoprolol and Enalapril daily   Hypothyroidism    takes Synthroid daily   IBS (irritable bowel syndrome)    Migraine    "nothing in the 2000s" (04/18/2018)   Mixed connective tissue disease (HCC)    NAFLD (nonalcoholic fatty liver disease)    Pneumonia     "several times" (04/18/2018)   PVC's (premature ventricular contractions)    noted on event monitor 02/2021   Rheumatoid arthritis (HCC)    "hands, elbows, shoulders, probably knees" (04/18/2018)   Scoliosis    Sjogren's syndrome (HCC)    Sleep apnea    mild - does not use cpap   Spondylosis    Type II diabetes mellitus (HCC)    takes Metformin and Hum R daily (04/18/2018)   Walker as ambulation aid    also uses wheelchair    Past Surgical History:  Procedure Laterality Date   ABDOMINAL HYSTERECTOMY  06/2005   "w/right ovariy"   AMPUTATION Right 01/28/2020    RIGHT FOURTH AND FIFTH RAY AMPUTATION   AMPUTATION Right 01/28/2020   Procedure: RIGHT FOURTH AND FIFTH RAY AMPUTATION,;  Surgeon: [name], MD;  Location: [facility] OR;  Service: Orthopedics;  Laterality: Right;   AMPUTATION Right 06/01/2021   Procedure: RIGHT BELOW KNEE AMPUTATION;  Surgeon: [name], MD;  Location[facility] OR;  Service: Orthopedics;  Laterality: Right;   AMPUTATION Right 07/22/2021   Procedure: RIGHT ABOVE KNEE AMPUTATION;  Surgeon: [name], MD;  Location: [facility] OR;  Service: Orthopedics;  Laterality: Right;   APPENDECTOMY     BREAST BIOPSY Bilateral    9 total (04/18/2018)   CORONARY ANGIOPLASTY WITH STENT PLACEMENT  10/01/2015   normal LM, 30% LAD, 85% mid RCA and 95% distal RCA s/p PCI of the mid to distal RCA and now on DAPT with ASA and Ticagrelor.     CORONARY STENT INTERVENTION Right 04/18/2018   Procedure: CORONARY STENT INTERVENTION;  Surgeon: [name], MD;  Location: [facility] INVASIVE CV LAB;  Service: Cardiovascular;  Laterality: Right;   DILATION AND CURETTAGE OF UTERUS     FRACTURE SURGERY     LAPAROSCOPIC CHOLECYSTECTOMY     LEFT HEART CATH AND CORONARY ANGIOGRAPHY N/A 04/18/2018   Procedure: LEFT HEART CATH AND CORONARY ANGIOGRAPHY;  Surgeon: [name], MD;  Location: [facility] INVASIVE CV LAB;  Service: Cardiovascular;  Laterality: N/A;   MUSCLE BIOPSY Left    "leg"    OOPHORECTOMY  07/2010   RADIOLOGY WITH ANESTHESIA N/A 05/25/2016   Procedure: RADIOLOGY WITH ANESTHESIA;  Surgeon: Medication Radiologist, MD;  Location: [facility] OR;  Service: Radiology;  Laterality: N/A;   RIGHT HEART  CATH N/A 09/26/2023   Procedure: RIGHT HEART CATH;  Surgeon: Elder Negus, MD;  Location: [facility] INVASIVE CV LAB;  Service: Cardiovascular;  Laterality: N/A;   STUMP REVISION Right 06/29/2021   Procedure: REVISION RIGHT BELOW KNEE AMPUTATION;  Surgeon: [name], MD;  Location: [facility] OR;  Service: Orthopedics;  Laterality: Right;   TEE WITHOUT CARDIOVERSION N/A 06/01/2021   Procedure: TRANSESOPHAGEAL ECHOCARDIOGRAM (TEE);  Surgeon: Sande Rives, MD;  Location: [facility] OR;  Service: Cardiovascular;  Laterality: N/A;   TONSILLECTOMY AND ADENOIDECTOMY     WRIST FRACTURE SURGERY Left    "crushed it"    Current Medications: No outpatient medications have been marked as taking for the 10/15/23 encounter (Appointment) with Filbert Schilder, NP.     Allergies:   Fish allergy, Fish oil, Gabapentin, Ipratropium-albuterol, Omega-3 fatty acids, Peanut-containing drug products, Victoza [liraglutide], Lovastatin, Metronidazole, Penicillins, Red yeast rice [cholestin], Rosuvastatin, Rotigotine, Atrovent hfa [ipratropium bromide hfa], Iodine, Ipratropium bromide, Morphine, Other, Pepto-bismol [bismuth subsalicylate], Wound dressing adhesive, Enalapril, Metoprolol, Suprep [na sulfate-k sulfate-mg sulf], and Tape   Social History   Socioeconomic History   Marital status: Married    Spouse name: [name] Packett   Number of children: 1   Years of education: 11   Highest education level: Associate degree: occupational, Scientist, product/process development, or vocational program  Occupational History    Comment: Not currrently working ( last worked 2004)  Tobacco Use   Smoking status: Former    Current packs/day: 0.00    Average packs/day: 1 pack/day for 19.4 years (19.4 ttl pk-yrs)    Types: Cigarettes    Start date:  32    Quit date: 02/11/2004    Years since quitting: 19.6    Passive exposure: Past   Smokeless tobacco: Never  Vaping Use   Vaping status: Never Used  Substance and Sexual Activity   Alcohol use: Yes    Comment: RARE Wine   Drug use: Not Currently    Types: Marijuana    Comment: "only in my teens"   Sexual activity: Yes    Birth control/protection: Surgical    Comment: Hysterectomy  Other Topics Concern   Not on file  Social History Narrative   Not on file   Social Drivers of Health   Financial Resource Strain: Medium Risk (09/10/2023)   Overall Financial Resource Strain (CARDIA)    Difficulty of Paying Living Expenses: Somewhat hard  Food Insecurity: No Food Insecurity (10/02/2023)   Hunger Vital Sign    Worried About Running Out of Food in the Last Year: Never true    Ran Out of Food in the Last Year: Never true  Transportation Needs: Unmet Transportation Needs (10/02/2023)   PRAPARE - Transportation    Lack of Transportation (Medical): Yes    Lack of Transportation (Non-Medical): Yes  Physical Activity: Unknown (09/10/2023)   Exercise Vital Sign    Days of Exercise per Week: 0 days    Minutes of Exercise per Session: Not on file  Stress: No Stress Concern Present (09/10/2023)   [employer] of Occupational Health - Occupational Stress Questionnaire    Feeling of Stress : Not at all  Social Connections: Socially Isolated (09/10/2023)   Social Connection and Isolation Panel [NHANES]    Frequency of Communication with Friends and Family: Once a week    Frequency of Social Gatherings with Friends and Family: Never    Attends Religious Services: Never    Database administrator or Organizations: No    Attends [employer]  or Organization Meetings: Not on file    Marital Status: Married     Family History: The patient's ***family history includes Allergic rhinitis in her father and son; Asthma in her father and son; Breast cancer in her paternal aunt; CAD in her father and  mother; Heart attack in her father and mother; Hypertension in her brother, brother, and mother; Lung cancer in her paternal aunt; Pancreatic cancer in her paternal aunt; Stroke in her mother.  ROS:   Please see the history of present illness.    *** All other systems reviewed and are negative.  EKGs/Labs/Other Studies Reviewed:    The following studies were reviewed today: ***      Recent Labs: 09/12/2023: B Natriuretic Peptide 106.8 09/23/2023: ALT 21 09/27/2023: TSH 4.951 10/01/2023: BUN 24; Creatinine, Ser 1.09; Hemoglobin 11.8; Magnesium 1.8; Platelets 349; Potassium 3.5; Sodium 137  Recent Lipid Panel    Component Value Date/Time   CHOL 147 05/08/2023 0953   CHOL 176 05/04/2020 0947   TRIG 142.0 05/08/2023 0953   HDL 39.10 05/08/2023 0953   HDL 41 05/04/2020 0947   CHOLHDL 4 05/08/2023 0953   VLDL 28.4 05/08/2023 0953   LDLCALC 79 05/08/2023 0953   LDLCALC 83 05/26/2022 1501   LDLDIRECT 121.0 10/27/2020 0809     Risk Assessment/Calculations:   {Does this patient have ATRIAL FIBRILLATION?:[phone]}  No BP recorded.  {Refresh Note OR Click here to enter BP  :1}***         Physical Exam:    VS:  There were no vitals taken for this visit.    Wt Readings from Last 3 Encounters:  10/01/23 287 lb 14.7 oz (130.6 kg)  09/10/23 292 lb (132.5 kg)  06/13/23 292 lb (132.5 kg)     GEN: *** Well nourished, well developed in no acute distress HEENT: Normal NECK: No JVD; No carotid bruits LYMPHATICS: No lymphadenopathy CARDIAC: ***RRR, no murmurs, rubs, gallops RESPIRATORY:  Clear to auscultation without rales, wheezing or rhonchi  ABDOMEN: Soft, non-tender, non-distended MUSCULOSKELETAL:  No edema; No deformity  SKIN: Warm and dry NEUROLOGIC:  Alert and oriented x 3 PSYCHIATRIC:  Normal affect   ASSESSMENT:    No diagnosis found. PLAN:    In order of problems listed above:  ***      {Are you ordering a CV Procedure (e.g. stress test, cath, DCCV, TEE, etc)?    Press F2        :[id number]}    Medication Adjustments/Labs and Tests Ordered: Current medicines are reviewed at length with the patient today.  Concerns regarding medicines are outlined above.  No orders of the defined types were placed in this encounter.  No orders of the defined types were placed in this encounter.   There are no Patient Instructions on file for this visit.   Signed, Georgie Chard, NP  10/11/2023 3:24 PM    [facility]

## 2023-10-11 NOTE — Telephone Encounter (Signed)
Looking over the notes from her hospitalization for acute CHF with diuresis of 31L, I would prefer to see this patient in person to fully evaluate her volume status.   Thank you

## 2023-10-11 NOTE — Telephone Encounter (Signed)
Duplicate, see other telephone note

## 2023-10-11 NOTE — Telephone Encounter (Signed)
Patient is requesting call back to discuss upcoming appt. Requesting appt be changed to a video visit.

## 2023-10-11 NOTE — Telephone Encounter (Signed)
Pt is requesting a callback regarding her still not having transportation yet for her appt on Monday so she'd like to know if a tele visit would be an option. Please advise

## 2023-10-12 ENCOUNTER — Telehealth: Payer: Self-pay

## 2023-10-12 NOTE — Patient Outreach (Deleted)
  Care Management  Transitions of Care Program Transitions of Care Post-discharge {VBCITOCWEEK:[id number]}  10/12/2023 Name: [name] MRN: [id number] DOB: May 27, 1961  Subjective: [name] is a 63 y.o. year old female who is a primary care patient of Sharlene Dory, DO. The Care Management team {TOCProgramtelephonefacetoface:30404} to assess and address transitions of care needs.   Plan: {TOCProgram unsuccessful call:30403}  Sig

## 2023-10-12 NOTE — Patient Outreach (Signed)
  Care Management  Transitions of Care Program Transitions of Care Post-discharge week 4  10/12/2023 Name: KAMBRIE EDDLEMAN MRN: [id number] DOB: October 23, 1960  Subjective: Miachel Roux Alejo is a 63 y.o. year old female who is a primary care patient of Sharlene Dory, DO. The Care Management team was unable to reach the patient by phone to assess and address transitions of care needs.   Plan: Additional outreach attempts will be made to reach the patient enrolled in the [facility] Program (Post Inpatient/ED Visit).  Wyline Mood BSN, Programmer, systems   Transitions of Care  [place] / [facility], [facility] Direct Dial Number: [phone]  Fax: [phone]

## 2023-10-15 ENCOUNTER — Telehealth: Payer: Self-pay | Admitting: *Deleted

## 2023-10-15 ENCOUNTER — Ambulatory Visit: Payer: Commercial Managed Care - HMO | Admitting: Cardiology

## 2023-10-15 NOTE — Progress Notes (Signed)
Complex Care Management Note  Care Guide Note 10/15/2023 Name: [name] MRN: [id number] DOB: [birth year]  [name] is a 63 y.o. year old female who sees Carmelia Roller, Jilda Roche, DO for primary care. I reached out to [facility] by phone today to offer complex care management services.  [name] was given information about Complex Care Management services today including:   The Complex Care Management services include support from the care team which includes your Nurse Coordinator, Clinical Social Worker, or Pharmacist.  The Complex Care Management team is here to help remove barriers to the health concerns and goals most important to you. Complex Care Management services are voluntary, and the patient may decline or stop services at any time by request to their care team member.   Complex Care Management Consent Status: Patient agreed to services and verbal consent obtained.   Follow up plan:  Telephone appointment with complex care management team member scheduled for:  10/22/2023  Encounter Outcome:  Patient Scheduled  [name], CMA, Care Guide [facility]  [facility], [facility] Guide Direct Dial: [phone]  Fax: [phone] Website: [email or web address]

## 2023-10-15 NOTE — Telephone Encounter (Signed)
Lvmtcb to reschedule f/u.

## 2023-10-17 ENCOUNTER — Ambulatory Visit: Payer: Commercial Managed Care - HMO | Admitting: Registered Nurse

## 2023-10-19 ENCOUNTER — Other Ambulatory Visit: Payer: Commercial Managed Care - HMO

## 2023-10-19 ENCOUNTER — Other Ambulatory Visit: Payer: Self-pay

## 2023-10-19 NOTE — Patient Outreach (Signed)
  Care Management  Transitions of Care Program Transitions of Care Post-discharge week 2  10/19/2023 Name: [name] MRN: [id number] DOB: 02-02-1961  Subjective: [name] is a 63 y.o. year old female who is a primary care patient of [name], DO. The Care Management team was unable to reach the patient by phone to assess and address transitions of care needs.   Plan: Additional outreach attempts will be made to reach the patient enrolled in the [facility] Program (Post Inpatient/ED Visit).  Pasco Lunger BSN, Programmer, Systems   Transitions of Care  [place] / [facility], [facility] Direct Dial Number: [phone]  Fax: [phone]

## 2023-10-22 ENCOUNTER — Ambulatory Visit: Payer: Self-pay

## 2023-10-22 NOTE — Patient Outreach (Signed)
  Care Coordination   Initial Visit Note   10/22/2023 Name: [name] MRN: [id number] DOB: 03-15-1961  [name] is a 63 y.o. year old female who sees [name], [name] Dials, [name] for primary care. I spoke with  [name] by phone today.  What matters to the patients health and wellness today?  Patient has financial strain.  Patient was unable to complete the full assessment as another call came in and patient request to end the conversation.    Goals Addressed             This Visit's Progress    Care Coordination Activities       Interventions Today    Flowsheet Row Most Recent Value  Chronic Disease   Chronic disease during today's visit Diabetes, Hypertension (HTN), Congestive Heart Failure (CHF)  General Interventions   General Interventions Discussed/Reviewed General Interventions Discussed, General Interventions Reviewed  [Pt husband is self employed & struggles with income. Husband refuses community referrals.Pt can't get into wheelchair due to amputee & leg weakness.MD visits are virtual.Pt request orders for nurse to draw blood.Decline VBCI-RN referral.]              SDOH assessments and interventions completed:  Yes  SDOH Interventions Today    Flowsheet Row Most Recent Value  SDOH Interventions   Food Insecurity Interventions Intervention Not Indicated  Housing Interventions Intervention Not Indicated  Transportation Interventions Other (Comment), Converted Appointment to Virtual Visit  [Pt is not physcially able to get in wheelchair and working with PT to stand.Has a [name] but husband can't lift her to get in.]  Utilities Interventions Other (Comment)  [Husband would not accept community referrals]        Care Coordination Interventions:  Yes, provided   Follow up plan: No further intervention required.   Encounter Outcome:  Patient Visit Completed

## 2023-10-22 NOTE — Patient Instructions (Signed)
 Visit Information  Thank you for taking time to visit with me today. Please don't hesitate to contact me if I can be of assistance to you.   Following are the goals we discussed today:  Patient will continue working with PT to improve mobility Patient will work with Nurse to complete lab work Patient will continue virtual visits with provider until she has improved with her mobility     If you are experiencing a Mental Health or Behavioral Health Crisis or need someone to talk to, please call 911  Patient verbalizes understanding of instructions and care plan provided today and agrees to view in MyChart. Active MyChart status and patient understanding of how to access instructions and care plan via MyChart confirmed with patient.     No further follow up required: Patient does not request a follow up visit.  Dallis Dues, BSW [place]  [facility], [facility] Social Worker Direct Dial: [phone]  Fax: [phone] Website: [email or web address]

## 2023-10-23 ENCOUNTER — Telehealth: Payer: Self-pay

## 2023-10-23 ENCOUNTER — Inpatient Hospital Stay: Payer: Commercial Managed Care - HMO

## 2023-10-23 ENCOUNTER — Inpatient Hospital Stay: Payer: Commercial Managed Care - HMO | Admitting: Family

## 2023-10-23 NOTE — Telephone Encounter (Signed)
Yes to remove and reinsert. She was supposed to be set up with urology to determine end date, has that been done? If she declines again, we can trial it out but that's usually better done in the urology office.

## 2023-10-23 NOTE — Telephone Encounter (Signed)
Please Advised  Copied from CRM [id number]. Topic: General - Other >> Oct 23, 2023  3:51 PM Irine Seal wrote: Reason for CRM: Mallory RN- [facility] contacted to inform that the patient currently has a 53 French catheter with a 10mL bulb Foley in place. Mallory is requesting clarification on whether they are authorized to manage the removal and reinsertion of the Foley catheter. They are also requesting the order for frequency of visits per month and the date for discontinuation of the catheter  Callback [phone]

## 2023-10-24 ENCOUNTER — Other Ambulatory Visit: Payer: Self-pay

## 2023-10-24 DIAGNOSIS — J45909 Unspecified asthma, uncomplicated: Secondary | ICD-10-CM

## 2023-10-24 DIAGNOSIS — Z466 Encounter for fitting and adjustment of urinary device: Secondary | ICD-10-CM

## 2023-10-24 MED ORDER — LEVOCETIRIZINE DIHYDROCHLORIDE 5 MG PO TABS: 5.0000 mg | ORAL_TABLET | Freq: Every evening | ORAL | 0 refills | Status: DC

## 2023-10-24 MED ORDER — SPIRONOLACTONE 50 MG PO TABS: 50.0000 mg | ORAL_TABLET | Freq: Every day | ORAL | 0 refills | Status: DC

## 2023-10-24 MED ORDER — FERROUS SULFATE 325 (65 FE) MG PO TABS: 325.0000 mg | ORAL_TABLET | Freq: Two times a day (BID) | ORAL | 1 refills | Status: DC

## 2023-10-24 MED ORDER — SENNOSIDES-DOCUSATE SODIUM 8.6-50 MG PO TABS: 2.0000 | ORAL_TABLET | Freq: Every evening | ORAL | 0 refills | Status: DC | PRN

## 2023-10-24 NOTE — Telephone Encounter (Signed)
Spoke with pt was advised we have placed referral for urology to f/u and spoke with [facility] RN Mallory per Dr.Wendling to discontinuation  Catheter in 2 weeks. Advised Mallory RN with [facility] that I talk with Pt and aware of orders of catheter been removed in 2 weeks. Pt aware of urology referral and follow up appt with Dr.Wendling in 2 weeks, appt scheduled. Pt advised to follow up with urology about her concerns of vaginal dryness, and Pt stated she will send message with home test she did.

## 2023-10-24 NOTE — Telephone Encounter (Signed)
Called spoke with [facility] RN Mallory, was advised per Dr.Wendling, Yes to remove and reinsert. Follow up  urology to determine end date. No referral was placed with urology. Nurse ask when will catheter need to removed.

## 2023-10-26 NOTE — Telephone Encounter (Signed)
I will update Dr. [facility] Cross nurse Alcario Drought the pt has not been set up with [facility] yet as the 2 appts that she was going to be set up at; she has cancelled both of these appts.   Pt was 1st going to set up with [facility] on 10/04/23 when she was going to come in for appt with Eligha Bridegroom, NP but pt cancelled her appt.   Pt was then to see Georgie Chard, NP 10/15/23 though pt cancelled this appt as well.    Luellen Pucker, RN  Tarri Fuller, CMA [facility], I apologize, I can't remember where we are with this patient. Has she been issued an Special educational needs teacher? Alcario Drought, RN

## 2023-10-29 ENCOUNTER — Emergency Department (HOSPITAL_BASED_OUTPATIENT_CLINIC_OR_DEPARTMENT_OTHER)
Admission: EM | Admit: 2023-10-29 | Discharge: 2023-10-30 | Disposition: A | Discharge status: Home / Self Care | Payer: Commercial Managed Care - HMO | Attending: Emergency Medicine | Admitting: Emergency Medicine

## 2023-10-29 DIAGNOSIS — T83018A Breakdown (mechanical) of other indwelling urethral catheter, initial encounter: Secondary | ICD-10-CM | POA: Insufficient documentation

## 2023-10-29 DIAGNOSIS — Y828 Other medical devices associated with adverse incidents: Secondary | ICD-10-CM | POA: Diagnosis not present

## 2023-10-29 DIAGNOSIS — Z7901 Long term (current) use of anticoagulants: Secondary | ICD-10-CM | POA: Insufficient documentation

## 2023-10-29 DIAGNOSIS — Z794 Long term (current) use of insulin: Secondary | ICD-10-CM | POA: Insufficient documentation

## 2023-10-29 DIAGNOSIS — T839XXA Unspecified complication of genitourinary prosthetic device, implant and graft, initial encounter: Secondary | ICD-10-CM

## 2023-10-29 NOTE — ED Notes (Signed)
Pt. Has a R AKA   Pt. Has noted L post op shoe with noted sore on the bottom of the L foot that is healing per the Pt and she is being seen by Wound care.

## 2023-10-29 NOTE — ED Triage Notes (Signed)
Per EMS, about 3:30 pm pt noticed issue with Cath, about 5:30 feels like something exploded and urine went everywhere, is able to urinate but not going in cath. Abd pain.

## 2023-10-29 NOTE — ED Provider Notes (Incomplete)
[facility] Provider Note   CSN: [id number] Arrival date & time: 10/29/23  2121     History {Add pertinent medical, surgical, social history, OB history to HPI:1} Chief Complaint  Patient presents with   urinary cath issue    [name] is a 63 y.o. female.  HPI     Home Medications Prior to Admission medications   Medication Sig Start Date End Date Taking? Authorizing Provider  acetaminophen (TYLENOL) 500 MG tablet Take 1,000 mg by mouth as needed for mild pain (pain score 1-3).    [provider]  albuterol (PROVENTIL) (2.5 MG/3ML) 0.083% nebulizer solution USE 1 VIAL IN NEBULIZER EVERY 4 HOURS AS NEEDED FOR WHEEZING FOR SHORTNESS OF BREATH 09/29/22   Wendling, Jilda Roche, DO  albuterol (VENTOLIN HFA) 108 (90 Base) MCG/ACT inhaler INHALE 2 PUFFS BY MOUTH EVERY 6 HOURS AS NEEDED FOR WHEEZING FOR SHORTNESS OF BREATH Patient taking differently: Inhale 2 puffs into the lungs every 4 (four) hours as needed for wheezing or shortness of breath. 04/26/23   Sharlene Dory, DO  apixaban (ELIQUIS) 5 MG TABS tablet Take 1 tablet (5 mg total) by mouth 2 (two) times daily. 10/10/23   Sharlene Dory, DO  augmented betamethasone dipropionate (DIPROLENE AF) 0.05 % cream Apply 1 Application topically 2 (two) times daily as needed (rash). 01/30/23   Sharlene Dory, DO  Continuous Glucose Sensor (DEXCOM G7 SENSOR) MISC USE TO CHECK FOR BLOOD SUGAR CONTINUOUSLY- CHANGE SENSOR EVERY 10 DAYS 05/08/23   Sharlene Dory, DO  diclofenac Sodium (PENNSAID) 2 % SOLN Apply 2 Pump (40 mg total) topically 2 (two) times daily as needed. 1 application to back twice daily 10/10/23   Carmelia Roller, Jilda Roche, DO  diltiazem (CARDIZEM CD) 360 MG 24 hr capsule Take 1 capsule (360 mg total) by mouth daily. 10/10/23   Sharlene Dory, DO  diphenhydrAMINE 25 mg in sodium chloride 0.9 % 50 mL Inject 25 mg into the vein as needed  (allergy). 04/05/13   [provider]  EPINEPHrine 0.3 mg/0.3 mL IJ SOAJ injection Inject 0.3 mg into the muscle as needed for anaphylaxis. 09/07/22   Sharlene Dory, DO  ferrous sulfate 325 (65 FE) MG tablet Take 1 tablet (325 mg total) by mouth 2 (two) times daily with a meal. 10/24/23   Wendling, Jilda Roche, DO  fluticasone (FLONASE) 50 MCG/ACT nasal spray USE 2 SPRAY(S) IN EACH NOSTRIL ONCE DAILY AS NEEDED FOR ALLERGIES OR  RHINITIS Patient taking differently: Place 2 sprays into both nostrils in the morning and at bedtime. 04/09/23   Sharlene Dory, DO  furosemide (LASIX) 40 MG tablet Take 1 tablet (40 mg total) by mouth daily. 10/10/23   Sharlene Dory, DO  gabapentin (NEURONTIN) 400 MG capsule Take 1 capsule (400 mg total) by mouth 4 (four) times daily. TAKE 1 CAPSULE BY MOUTH IN THE MORNING AND 1 AT NOON AND 2 AT BEDTIME Strength: 400 mg Patient taking differently: Take 400-800 mg by mouth See admin instructions. Take 400 mg by mouth in the morning and the afternoon. Take 800 mg by mouth at bedtime. 05/11/23   Raulkar, Drema Pry, MD  gentamicin ointment (GARAMYCIN) 0.1 % Apply 1 Application topically in the morning and at bedtime. 07/23/23   [provider]  Glucagon, rDNA, (GLUCAGON EMERGENCY) 1 MG KIT Inject 1 mg into the vein daily as needed. 10/10/23   Sharlene Dory, DO  guaiFENesin (MUCINEX) 600 MG  12 hr tablet Take 600 mg by mouth 2 (two) times daily.    [provider]  HUMULIN R 500 UNIT/ML injection INJECT 0.08-0.3 MLS (40-150 UNITS) INTO THE SKIN THREE TIMES DAILY WITH MEALS Patient taking differently: Inject 5-15 Units into the skin in the morning, at noon, and at bedtime. Takes 5 units three times a day with meals.  Uses SSI on top of the scheduled insulin with 3-10 units depending on blood sugar level 08/31/23   Wendling, Jilda Roche, DO  hydrocortisone cream 1 % Apply 1 application topically 3 (three) times daily as  needed for itching (minor skin irritation). Patient taking differently: Apply 1 application  topically in the morning and at bedtime. 06/24/21   Love, Evlyn Kanner, PA-C  INSULIN SYRINGE 1CC/29G (B-D INSULIN SYRINGE) 29G X 1/2" 1 ML MISC Use three times daily with insulin.  DX E11.9 06/11/20   Sharlene Dory, DO  Insulin Syringes, Disposable, U-100 0.5 ML MISC 100 each by Does not apply route 4 (four) times daily -  before meals and at bedtime. 10/01/23   Amin, Ankit C, MD  isosorbide mononitrate (IMDUR) 60 MG 24 hr tablet Take 1 tablet (60 mg total) by mouth daily. 04/30/23   Swinyer, Zachary George, NP  levocetirizine (XYZAL) 5 MG tablet Take 1 tablet (5 mg total) by mouth every evening. 10/24/23   Sharlene Dory, DO  levothyroxine (SYNTHROID) 200 MCG tablet TAKE 1 TABLET BY MOUTH ONCE DAILY BEFORE BREAKFAST 09/17/23   Wendling, Jilda Roche, DO  lidocaine (LIDODERM) 5 % Place 1 patch onto the skin every 12 (twelve) hours. Remove & Discard patch within 12 hours or as directed Patient taking differently: Place 1 patch onto the skin daily as needed (pain). Remove & Discard patch within 12 hours or as directed 04/12/23   Jones Bales, NP  lidocaine (XYLOCAINE) 5 % ointment Apply 1 Application topically in the morning and at bedtime. 02/02/23   [provider]  metFORMIN (GLUCOPHAGE-XR) 500 MG 24 hr tablet Take 2 tablets by mouth twice daily 09/17/23   Wendling, Jilda Roche, DO  Multiple Vitamins-Minerals (WOMENS 50+ MULTI VITAMIN PO) Take 1 tablet by mouth in the morning and at bedtime.    [provider]  mupirocin ointment (BACTROBAN) 2 % Apply topically 2 (two) times daily. 10/10/23   Sharlene Dory, DO  naloxone [facility]) nasal spray 4 mg/0.1 mL Place 1 spray into the nose as needed (opoid overdose). 10/10/23   Sharlene Dory, DO  nitroGLYCERIN (NITROSTAT) 0.4 MG SL tablet Place 1 tablet (0.4 mg total) under the tongue every 5 (five) minutes as needed for chest  pain. 10/10/23   Sharlene Dory, DO  omeprazole (PRILOSEC) 40 MG capsule Take 1 capsule by mouth once daily 08/24/23   Wendling, Jilda Roche, DO  OneTouch Delica Lancets 33G MISC Use to check blood glucose up to 4 times a day 11/16/22   Sharlene Dory, DO  [facility] VERIO test strip Use as instructed 06/27/23   Sharlene Dory, DO  Oxycodone HCl 20 MG TABS Take 1 tablet (20 mg total) by mouth every 8 (eight) hours as needed. 10/10/23   Jones Bales, NP  Polyethyl Glyc-Propyl Glyc PF (SYSTANE PRESERVATIVE FREE) 0.4-0.3 % SOLN Place 2 drops into both eyes in the morning and at bedtime. 05/11/16   [provider]  pramipexole (MIRAPEX) 1 MG tablet Take 1 tablet (1 mg total) by mouth 3 (three) times daily. 08/08/23   Saguier, Ramon Dredge,  PA-C  pregabalin (LYRICA) 50 MG capsule Take 1 capsule (50 mg total) by mouth 3 (three) times daily. 05/11/23   Raulkar, Drema Pry, MD  promethazine (PHENERGAN) 25 MG tablet Take 1 tablet (25 mg total) by mouth every 8 (eight) hours as needed for vomiting or nausea. Patient taking differently: Take 25 mg by mouth as needed for vomiting or nausea. 04/20/23   Sharlene Dory, DO  senna-docusate (SENOKOT-S) 8.6-50 MG tablet Take 2 tablets by mouth at bedtime as needed for moderate constipation. 10/24/23   Sharlene Dory, DO  spironolactone (ALDACTONE) 50 MG tablet Take 1 tablet (50 mg total) by mouth daily. 10/24/23   Sharlene Dory, DO  Vitamin D, Ergocalciferol, (DRISDOL) 1.25 MG (50000 UNIT) CAPS capsule Take 1 capsule (50,000 Units total) by mouth once a week. 09/17/23   Sharlene Dory, DO      Allergies    Fish allergy, Fish oil, Gabapentin, Ipratropium-albuterol, Omega-3 fatty acids, Peanut-containing drug products, Victoza [liraglutide], Lovastatin, Metronidazole, Penicillins, Red yeast rice [cholestin], Rosuvastatin, Rotigotine, Atrovent hfa [ipratropium bromide hfa], Iodine, Ipratropium bromide, Morphine,  Other, Pepto-bismol [bismuth subsalicylate], Wound dressing adhesive, Enalapril, Metoprolol, Suprep [na sulfate-k sulfate-mg sulf], and Tape    Review of Systems   Review of Systems  Physical Exam Updated Vital Signs Ht 1.588 m (5' 2.5")   Wt 133.9 kg   SpO2 92%   BMI 53.11 kg/m  Physical Exam  ED Results / Procedures / Treatments   Labs (all labs ordered are listed, but only abnormal results are displayed) Labs Reviewed - No data to display  EKG None  Radiology No results found.  Procedures Procedures  {Document cardiac monitor, telemetry assessment procedure when appropriate:1}  Medications Ordered in ED Medications - No data to display  ED Course/ Medical Decision Making/ A&P   {   Click here for ABCD2, HEART and other calculatorsREFRESH Note before signing :1}                              Medical Decision Making  ***  {Document critical care time when appropriate:1} {Document review of labs and clinical decision tools ie heart score, Chads2Vasc2 etc:1}  {Document your independent review of radiology images, and any outside records:1} {Document your discussion with family members, caretakers, and with consultants:1} {Document social determinants of health affecting pt's care:1} {Document your decision making why or why not admission, treatments were needed:1} Final Clinical Impression(s) / ED Diagnoses Final diagnoses:  None    Rx / DC Orders ED Discharge Orders     None

## 2023-10-29 NOTE — ED Notes (Signed)
Pt. Has a F/C that was placed approx. 2 wks ago. Feb 4th  Noted yellow urine draining from the F/C.  Pt. In no distress.  Pt. Reports feeling a bursting sensation today in the lower abd.  And then felt urine drain outside the Cath placement.    No urine is draining outside the cath at present time.  Pt. Was in [facility] [place].  With cath in place.

## 2023-10-29 NOTE — ED Notes (Signed)
Assessed Pt. Concerns of Cath leak and none noted.  Pt. Has no leaking noted and no wet area noted on her pantie area.  Pt. Has no urine under her on the bed.  Pt. Has plenty of urine draining into the F/C bag.  Pt. In no distress and reports feeling relieved after the feeling she felt at home when urine drained out of her and around   The F/C.

## 2023-10-30 ENCOUNTER — Telehealth: Payer: Self-pay | Admitting: Neurology

## 2023-10-30 NOTE — Telephone Encounter (Signed)
Called [facility] back lvm for verbal order for [facility]  If they have any question to call our office back.   Callback Number: [phone] Leave a message confidential mailbox Service Requested: Skilled Nursing Frequency: Change patient catheter PRN

## 2023-10-30 NOTE — ED Notes (Signed)
[facility]ed [facility] transport at 1:52a  states that no eta as it is a Economist

## 2023-10-30 NOTE — Discharge Instructions (Signed)
Foley catheter seems to be flowing well now.  Follow-up for changing out the catheter as planned.  Return if it does not flow.

## 2023-10-30 NOTE — Telephone Encounter (Signed)
Copied from CRM [id number]. Topic: Clinical - Home Health Verbal Orders >> Oct 29, 2023  4:41 PM Thomes Dinning wrote: Caller/Agency: [facility] [place] Number: [phone] Leave a message confidential mailbox Service Requested: Skilled Nursing Frequency: Change patient catheter PRN  Any new concerns about the patient? No

## 2023-11-01 ENCOUNTER — Telehealth: Payer: Self-pay

## 2023-11-01 NOTE — Telephone Encounter (Addendum)
Spoke w/ Mallory- informed okay to replace it, however urology would need to d/c it.

## 2023-11-01 NOTE — Telephone Encounter (Signed)
Copied from CRM [id number]. Topic: Clinical - Medical Advice >> Nov 01, 2023  3:45 PM [name] wrote: Reason for CRM: Patient is calling in regarding appointment with Dr. Carmelia [name] on 02/21.   She has had one leg amputated and is having great difficulty getting up on the other leg and transferring safely from either her wheelchair or bed. She is concerned about being able to safely transfer into her [name] and wheelchair to come to the appointment. She is unable to get transportation assistance through her insurance.  Patient noted that her home health nurse, [name] will be coming on 02/25 and can pull any labwork that Dr. Carmelia [name] would require. She can also assess the patient's Foley catheter and change the catheter if needed.  Patient requests call back to see if this is possible for her home health nurse to perform any needed procedures due to the fact it would not be safe for her to travel with 2 gait belts holding her into her driver's seat and she is unable to ride in her husbands [name] because it is not handicap accessible. She does have a urology appointment on 03/04 that she plans to be able to attend.   CB# [phone]

## 2023-11-01 NOTE — Telephone Encounter (Signed)
Copied from CRM [id number]. Topic: Clinical - Medication Question >> Nov 01, 2023  3:40 PM Myrtice Lauth wrote: Reason for CRM: [facility] NURSE [facility] [phone] called regarding pt's Foley cath. Wanted to know if the provider wants the Urology provider to remove it or if he wants the home care nurse to remove the foley. Please give her a call back at your earliest convience

## 2023-11-02 ENCOUNTER — Telehealth: Payer: Self-pay

## 2023-11-02 ENCOUNTER — Encounter: Payer: Self-pay | Admitting: Family Medicine

## 2023-11-02 ENCOUNTER — Telehealth: Payer: Commercial Managed Care - HMO | Admitting: Family Medicine

## 2023-11-02 DIAGNOSIS — I5032 Chronic diastolic (congestive) heart failure: Secondary | ICD-10-CM | POA: Diagnosis not present

## 2023-11-02 NOTE — Telephone Encounter (Signed)
Called spoke with pt and [facility] front desk Clydie Braun and she gave me the fax number, Fax over lab orders and what provider wants. Lab order was faxed for Bmp.

## 2023-11-02 NOTE — Telephone Encounter (Signed)
Called Home Health back lvm for  Verbal Orders Ask them to call our office back if he had any question. >> Nov 01, 2023  4:56 PM Corin V wrote: Caller/Agency: [facility] Callback Number: [phone] Service Requested: Occupational Therapy Frequency: twice for two weeks then once for 1 week

## 2023-11-02 NOTE — Progress Notes (Signed)
Chief Complaint  Patient presents with   Follow-up    Follow up    Subjective: Patient is a 63 y.o. female here for f/u. We are interacting via web portal for an electronic face-to-face visit. I verified patient's ID using 2 identifiers. Patient agreed to proceed with visit via this method. Patient is at home, I am at office. Patient, her husband [name] and I are present for visit.   Pt started back on Lasix 40 mg/d for HFpEF. Reports compliance, no AE's. She is doing much better back on it. Breathing well, swelling down significantly in her remaining LE.   Past Medical History:  Diagnosis Date   Aortic stenosis    mild AS by echo 02/2021   Asthma    "on daily RX and rescue inhaler" (04/18/2018)   Chronic diastolic CHF (congestive heart failure) (HCC) 09/2015   Chronic lower back pain    Chronic neck pain    Chronic pain syndrome    Fentanyl Patch   Colon polyps    Coronary artery disease    cath with normal LM, 30% LAD, 85% mid RCA and 95% distal RCA s/p PCI of the mid to distal RCA and now on DAPT with ASA and Ticagrelor.     Eczema    Excessive daytime sleepiness 11/26/2015   Fibromyalgia    Gallstones    GERD (gastroesophageal reflux disease)    Heart murmur    "noted for the 1st time on 04/18/2018"   History of blood transfusion 07/2010   "S/P oophorectomy"   History of gout    History of hiatal hernia 1980s   "gone now" (04/18/2018)   Hyperlipidemia    Hypertension    takes Metoprolol and Enalapril daily   Hypothyroidism    takes Synthroid daily   IBS (irritable bowel syndrome)    Migraine    "nothing in the 2000s" (04/18/2018)   Mixed connective tissue disease (HCC)    NAFLD (nonalcoholic fatty liver disease)    Pneumonia    "several times" (04/18/2018)   PVC's (premature ventricular contractions)    noted on event monitor 02/2021   Rheumatoid arthritis (HCC)    "hands, elbows, shoulders, probably knees" (04/18/2018)   Scoliosis    Sjogren's syndrome (HCC)    Sleep apnea     mild - does not use cpap   Spondylosis    Type II diabetes mellitus (HCC)    takes Metformin and Hum R daily (04/18/2018)   Walker as ambulation aid    also uses wheelchair    Objective: No conversational dyspnea Age appropriate judgment and insight Nml affect and mood  Assessment and Plan: Chronic heart failure with preserved ejection fraction (HCC)  Chronic, much more stbale. Cont Lasix 40 mg/d w Aldactone. Yeast infections prevented SGLT-2 inhibitor addition. F/u in 6 mo or prn.  The patient and her spouse voiced understanding and agreement to the plan.  Jilda Roche [place]rett, DO 11/02/23  9:00 AM

## 2023-11-02 NOTE — Telephone Encounter (Signed)
Copied from CRM [id number]. Topic: Clinical - Home Health Verbal Orders >> Nov 01, 2023  4:56 PM Corin V wrote: Caller/Agency: [facility] Callback Number: [phone] Service Requested: Occupational Therapy Frequency: twice for two weeks then once for 1 week Any new concerns about the patient? No

## 2023-11-05 ENCOUNTER — Encounter: Payer: Commercial Managed Care - HMO | Admitting: Registered Nurse

## 2023-11-05 ENCOUNTER — Other Ambulatory Visit: Payer: Self-pay | Admitting: Family Medicine

## 2023-11-06 ENCOUNTER — Telehealth: Payer: Self-pay

## 2023-11-06 NOTE — Telephone Encounter (Signed)
 Copied from CRM [id number]. Topic: Clinical - Home Health Verbal Orders >> Nov 06, 2023  4:42 PM Denese Killings wrote: Caller/Agency: [facility] Callback Number: [phone] Service Requested: Occupational Therapy Frequency: Extension 2x a week for 2 weeks starting next week.  Any new concerns about the patient? No

## 2023-11-07 NOTE — Telephone Encounter (Signed)
 Called Home Health verbal ordered given for OT    Caller/Agency: [facility] Callback Number: [phone] Service Requested: Occupational Therapy Frequency: Extension 2x a week for 2 weeks starting next week.

## 2023-11-09 ENCOUNTER — Ambulatory Visit: Payer: Commercial Managed Care - HMO | Admitting: Registered Nurse

## 2023-11-13 ENCOUNTER — Ambulatory Visit: Payer: Commercial Managed Care - HMO | Admitting: Urology

## 2023-11-14 ENCOUNTER — Encounter: Payer: Self-pay | Admitting: *Deleted

## 2023-11-14 ENCOUNTER — Telehealth: Payer: Self-pay | Admitting: Registered Nurse

## 2023-11-14 ENCOUNTER — Encounter: Payer: Commercial Managed Care - HMO | Admitting: Registered Nurse

## 2023-11-14 NOTE — Telephone Encounter (Signed)
 Return [name] call,  Was in contact with [name] regarding [name] concerns regarding [name] transportation issue due to her weakness.  Her last office visit was 07/09/2024, she had a telephone visit on 08/16/2023. [name] was admitted to [facility] on 09/10/23 and discharged on 10/01/2023, her appointment was changed to 11/05/2023, to give her husband time to work on her transportation. He called office reporting he was unable to transfer her from wheelchair to car.  This provider spoke with [name] in January explaining [name] will need to come to office for her next scheduled appointment, he verbalize understanding.  Spoke with [name] and explain office policy  and narcotic contract. He was instructed to call [name] Child psychotherapist, we are unable to prescribe without office visit. He verbalizes understanding. He was also instructed to take [name] to the emergency room to be evaluated to assist with her medical needs, he verbalizes understanding.

## 2023-11-14 NOTE — Telephone Encounter (Signed)
 Dismissal letter signed by Dr. Mayford Knife and sent to HIM.

## 2023-11-14 NOTE — Progress Notes (Deleted)
 Subjective:    Patient ID: [name], female    DOB: [birth year], 63 y.o.   MRN: [id number]  HPI   Pain Inventory Average Pain {NUMBERS; 0-10:5044} Pain Right Now {NUMBERS; 0-10:5044} My pain is {PAIN DESCRIPTION:21022940}  In the last 24 hours, has pain interfered with the following? General activity {NUMBERS; 0-10:5044} Relation with others {NUMBERS; 0-10:5044} Enjoyment of life {NUMBERS; 0-10:5044} What TIME of day is your pain at its worst? {time of day:24191} Sleep (in general) {BHH GOOD/FAIR/POOR:22877}  Pain is worse with: {ACTIVITIES:21022942} Pain improves with: {PAIN IMPROVES WUJW:11914782} Relief from Meds: {NUMBERS; 0-10:5044}  Family History  Problem Relation Age of Onset   CAD Mother    Hypertension Mother    Heart attack Mother    Stroke Mother    CAD Father    Heart attack Father    Allergic rhinitis Father    Asthma Father    Hypertension Brother    Hypertension Brother    Pancreatic cancer Paternal Aunt    Breast cancer Paternal Aunt    Lung cancer Paternal Aunt    Allergic rhinitis Son    Asthma Son    Social History   Socioeconomic History   Marital status: Married    Spouse name: [name] Iyer   Number of children: 1   Years of education: 11   Highest education level: Associate degree: occupational, Scientist, product/process development, or vocational program  Occupational History    Comment: Not currrently working ( last worked 2004)  Tobacco Use   Smoking status: Former    Current packs/day: 0.00    Average packs/day: 1 pack/day for 19.4 years (19.4 ttl pk-yrs)    Types: Cigarettes    Start date: 36    Quit date: 02/11/2004    Years since quitting: 19.7    Passive exposure: Past   Smokeless tobacco: Never  Vaping Use   Vaping status: Never Used  Substance and Sexual Activity   Alcohol use: Yes    Comment: RARE Wine   Drug use: Not Currently    Types: Marijuana    Comment: "only in my teens"   Sexual activity: Yes    Birth control/protection:  Surgical    Comment: Hysterectomy  Other Topics Concern   Not on file  Social History Narrative   Not on file   Social Drivers of Health   Financial Resource Strain: Medium Risk (09/10/2023)   Overall Financial Resource Strain (CARDIA)    Difficulty of Paying Living Expenses: Somewhat hard  Food Insecurity: No Food Insecurity (10/22/2023)   Hunger Vital Sign    Worried About Running Out of Food in the Last Year: Never true    Ran Out of Food in the Last Year: Never true  Transportation Needs: Unmet Transportation Needs (10/22/2023)   PRAPARE - Transportation    Lack of Transportation (Medical): Yes    Lack of Transportation (Non-Medical): Yes  Physical Activity: Unknown (09/10/2023)   Exercise Vital Sign    Days of Exercise per Week: 0 days    Minutes of Exercise per Session: Not on file  Stress: No Stress Concern Present (09/10/2023)   [employer] of Occupational Health - Occupational Stress Questionnaire    Feeling of Stress : Not at all  Social Connections: Socially Isolated (09/10/2023)   Social Connection and Isolation Panel [NHANES]    Frequency of Communication with Friends and Family: Once a week    Frequency of Social Gatherings with Friends and Family: Never    Attends Religious Services: Never  Active Member of Clubs or Organizations: No    Attends Banker Meetings: Not on file    Marital Status: Married   Past Surgical History:  Procedure Laterality Date   ABDOMINAL HYSTERECTOMY  06/2005   "w/right ovariy"   AMPUTATION Right 01/28/2020    RIGHT FOURTH AND FIFTH RAY AMPUTATION   AMPUTATION Right 01/28/2020   Procedure: RIGHT FOURTH AND FIFTH RAY AMPUTATION,;  Surgeon: [name], MD;  [facility];  Service: Orthopedics;  Laterality: Right;   AMPUTATION Right 06/01/2021   Procedure: RIGHT BELOW KNEE AMPUTATION;  Surgeon: [name], MD;  Location: [facility] OR;  Service: Orthopedics;  Laterality: Right;   AMPUTATION Right 07/22/2021    Procedure: RIGHT ABOVE KNEE AMPUTATION;  Surgeon: [name], MD;  Location: [facility] OR;  Service: Orthopedics;  Laterality: Right;   APPENDECTOMY     BREAST BIOPSY Bilateral    9 total (04/18/2018)   CORONARY ANGIOPLASTY WITH STENT PLACEMENT  10/01/2015   normal LM, 30% LAD, 85% mid RCA and 95% distal RCA s/p PCI of the mid to distal RCA and now on DAPT with ASA and Ticagrelor.     CORONARY STENT INTERVENTION Right 04/18/2018   Procedure: CORONARY STENT INTERVENTION;  Surgeon: [name], MD;  [facility]: [facility];  Service: Cardiovascular;  Laterality: Right;   DILATION AND CURETTAGE OF UTERUS     FRACTURE SURGERY     LAPAROSCOPIC CHOLECYSTECTOMY     LEFT HEART CATH AND CORONARY ANGIOGRAPHY N/A 04/18/2018   Procedure: LEFT HEART CATH AND CORONARY ANGIOGRAPHY;  Surgeon: [name], MD;  [facility]: [facility];  Service: Cardiovascular;  Laterality: N/A;   MUSCLE BIOPSY Left    "leg"   OOPHORECTOMY  07/2010   RADIOLOGY WITH ANESTHESIA N/A 05/25/2016   Procedure: RADIOLOGY WITH ANESTHESIA;  Surgeon: Medication Radiologist, MD;  Location: [facility] OR;  Service: Radiology;  Laterality: N/A;   RIGHT HEART CATH N/A 09/26/2023   Procedure: RIGHT HEART CATH;  Surgeon: Elder Negus, MD;  [facility];  Service: Cardiovascular;  Laterality: N/A;   STUMP REVISION Right 06/29/2021   Procedure: REVISION RIGHT BELOW KNEE AMPUTATION;  Surgeon: [name], MD;  Location: [facility] OR;  Service: Orthopedics;  Laterality: Right;   TEE WITHOUT CARDIOVERSION N/A 06/01/2021   Procedure: TRANSESOPHAGEAL ECHOCARDIOGRAM (TEE);  Surgeon: Sande Rives, MD;  Location: [facility] OR;  Service: Cardiovascular;  Laterality: N/A;   TONSILLECTOMY AND ADENOIDECTOMY     WRIST FRACTURE SURGERY Left    "crushed it"   Past Surgical History:  Procedure Laterality Date   ABDOMINAL HYSTERECTOMY  06/2005   "w/right ovariy"   AMPUTATION Right 01/28/2020    RIGHT FOURTH AND FIFTH  RAY AMPUTATION   AMPUTATION Right 01/28/2020   Procedure: RIGHT FOURTH AND FIFTH RAY AMPUTATION,;  Surgeon: [name], MD;  Location[facility];  Service: Orthopedics;  Laterality: Right;   AMPUTATION Right 06/01/2021   Procedure: RIGHT BELOW KNEE AMPUTATION;  Surgeon: [name], MD;  Location: [facility] OR;  Service: Orthopedics;  Laterality: Right;   AMPUTATION Right 07/22/2021   Procedure: RIGHT ABOVE KNEE AMPUTATION;  Surgeon: [name], MD;  Location: [facility] OR;  Service: Orthopedics;  Laterality: Right;   APPENDECTOMY     BREAST BIOPSY Bilateral    9 total (04/18/2018)   CORONARY ANGIOPLASTY WITH STENT PLACEMENT  10/01/2015   normal LM, 30% LAD, 85% mid RCA and 95% distal RCA s/p PCI of  the mid to distal RCA and now on DAPT with ASA and Ticagrelor.     CORONARY STENT INTERVENTION Right 04/18/2018   Procedure: CORONARY STENT INTERVENTION;  Surgeon: [name], MD;  [facility];  Service: Cardiovascular;  Laterality: Right;   DILATION AND CURETTAGE OF UTERUS     FRACTURE SURGERY     LAPAROSCOPIC CHOLECYSTECTOMY     LEFT HEART CATH AND CORONARY ANGIOGRAPHY N/A 04/18/2018   Procedure: LEFT HEART CATH AND CORONARY ANGIOGRAPHY;  Surgeon: [name], MD;  [facility];  Service: Cardiovascular;  Laterality: N/A;   MUSCLE BIOPSY Left    "leg"   OOPHORECTOMY  07/2010   RADIOLOGY WITH ANESTHESIA N/A 05/25/2016   Procedure: RADIOLOGY WITH ANESTHESIA;  Surgeon: Medication Radiologist, MD;  Location: [facility] OR;  Service: Radiology;  Laterality: N/A;   RIGHT HEART CATH N/A 09/26/2023   Procedure: RIGHT HEART CATH;  Surgeon: Elder Negus, MD;  [facility];  Service: Cardiovascular;  Laterality: N/A;   STUMP REVISION Right 06/29/2021   Procedure: REVISION RIGHT BELOW KNEE AMPUTATION;  Surgeon: [name], MD;  Location: [facility] OR;  Service: Orthopedics;  Laterality: Right;   TEE WITHOUT CARDIOVERSION N/A 06/01/2021   Procedure:  TRANSESOPHAGEAL ECHOCARDIOGRAM (TEE);  Surgeon: Sande Rives, MD;  Location: [facility] OR;  Service: Cardiovascular;  Laterality: N/A;   TONSILLECTOMY AND ADENOIDECTOMY     WRIST FRACTURE SURGERY Left    "crushed it"   Past Medical History:  Diagnosis Date   Aortic stenosis    mild AS by echo 02/2021   Asthma    "on daily RX and rescue inhaler" (04/18/2018)   Chronic diastolic CHF (congestive heart failure) (HCC) 09/2015   Chronic lower back pain    Chronic neck pain    Chronic pain syndrome    Fentanyl Patch   Colon polyps    Coronary artery disease    cath with normal LM, 30% LAD, 85% mid RCA and 95% distal RCA s/p PCI of the mid to distal RCA and now on DAPT with ASA and Ticagrelor.     Eczema    Excessive daytime sleepiness 11/26/2015   Fibromyalgia    Gallstones    GERD (gastroesophageal reflux disease)    Heart murmur    "noted for the 1st time on 04/18/2018"   History of blood transfusion 07/2010   "S/P oophorectomy"   History of gout    History of hiatal hernia 1980s   "gone now" (04/18/2018)   Hyperlipidemia    Hypertension    takes Metoprolol and Enalapril daily   Hypothyroidism    takes Synthroid daily   IBS (irritable bowel syndrome)    Migraine    "nothing in the 2000s" (04/18/2018)   Mixed connective tissue disease (HCC)    NAFLD (nonalcoholic fatty liver disease)    Pneumonia    "several times" (04/18/2018)   PVC's (premature ventricular contractions)    noted on event monitor 02/2021   Rheumatoid arthritis (HCC)    "hands, elbows, shoulders, probably knees" (04/18/2018)   Scoliosis    Sjogren's syndrome (HCC)    Sleep apnea    mild - does not use cpap   Spondylosis    Type II diabetes mellitus (HCC)    takes Metformin and Hum R daily (04/18/2018)   Walker as ambulation aid    also uses wheelchair   There were no vitals taken for this visit.  Opioid Risk Score:  Fall Risk Score:  `1  Depression screen [facility] 2/9     07/10/2023   11:08 AM 06/13/2023    10:36 AM 05/11/2023    9:02 AM 04/12/2023    9:45 AM 01/30/2023   10:04 AM 01/01/2023    9:58 AM 11/28/2022    9:45 AM  Depression screen PHQ 2/9  Decreased Interest 0 1 3 0 1 1 1   Down, Depressed, Hopeless 0 1 1 0 1 1 1   PHQ - 2 Score 0 2 4 0 2 2 2     Review of Systems     Objective:   Physical Exam        Assessment & Plan:

## 2023-11-15 ENCOUNTER — Ambulatory Visit: Admitting: Urology

## 2023-11-15 ENCOUNTER — Telehealth: Payer: Self-pay | Admitting: Cardiology

## 2023-11-15 NOTE — Telephone Encounter (Signed)
 We find it necessary to inform you that [facility] and all providers practicing at this clinic will no longer be able to provide medical care to you.  We believe that our patient/provider relationship has been compromised and thus would request that you seek care elsewhere. 11/14/23

## 2023-11-19 ENCOUNTER — Telehealth (INDEPENDENT_AMBULATORY_CARE_PROVIDER_SITE_OTHER): Admitting: Family Medicine

## 2023-11-19 ENCOUNTER — Telehealth: Payer: Self-pay | Admitting: Registered Nurse

## 2023-11-19 ENCOUNTER — Telehealth: Payer: Self-pay

## 2023-11-19 ENCOUNTER — Encounter: Payer: Self-pay | Admitting: Family Medicine

## 2023-11-19 DIAGNOSIS — G8929 Other chronic pain: Secondary | ICD-10-CM

## 2023-11-19 MED ORDER — OXYCODONE HCL 20 MG PO TABS: 20.0000 mg | ORAL_TABLET | Freq: Three times a day (TID) | ORAL | 0 refills | Status: DC

## 2023-11-19 NOTE — Telephone Encounter (Signed)
 Received a staff message from Dr. Carmelia Roller,  He will be prescribing [name] Oxycodone in the interim, until she is able to come to office for visit.

## 2023-11-19 NOTE — Progress Notes (Signed)
 Chief Complaint  Patient presents with   Follow-up    Patient would like to go over medications with provider.    Subjective: Patient is a 63 y.o. female here for f/u pain management. We are interacting via web portal for an electronic face-to-face visit. I verified patient's ID using 2 identifiers. Patient agreed to proceed with visit via this method. Patient is at home, I am at office. Patient and I are present for visit.   Pt follows w the pain clinic for chronic pain. She takes oxycodone 20 mg TID. Compliant, no AE's. No illicit drug use, does not fail UDS's with pain team. Their office policy prohibits prescribing narcotic medication without person visits.  I recommended she reach out to me to continue management.  She reports this to be temporary until she can physically improve.  She is working with physical and Occupational Therapy.  Past Medical History:  Diagnosis Date   Aortic stenosis    mild AS by echo 02/2021   Asthma    "on daily RX and rescue inhaler" (04/18/2018)   Chronic diastolic CHF (congestive heart failure) (HCC) 09/2015   Chronic lower back pain    Chronic neck pain    Chronic pain syndrome    Fentanyl Patch   Colon polyps    Coronary artery disease    cath with normal LM, 30% LAD, 85% mid RCA and 95% distal RCA s/p PCI of the mid to distal RCA and now on DAPT with ASA and Ticagrelor.     Eczema    Excessive daytime sleepiness 11/26/2015   Fibromyalgia    Gallstones    GERD (gastroesophageal reflux disease)    Heart murmur    "noted for the 1st time on 04/18/2018"   History of blood transfusion 07/2010   "S/P oophorectomy"   History of gout    History of hiatal hernia 1980s   "gone now" (04/18/2018)   Hyperlipidemia    Hypertension    takes Metoprolol and Enalapril daily   Hypothyroidism    takes Synthroid daily   IBS (irritable bowel syndrome)    Migraine    "nothing in the 2000s" (04/18/2018)   Mixed connective tissue disease (HCC)    NAFLD (nonalcoholic  fatty liver disease)    Pneumonia    "several times" (04/18/2018)   PVC's (premature ventricular contractions)    noted on event monitor 02/2021   Rheumatoid arthritis (HCC)    "hands, elbows, shoulders, probably knees" (04/18/2018)   Scoliosis    Sjogren's syndrome (HCC)    Sleep apnea    mild - does not use cpap   Spondylosis    Type II diabetes mellitus (HCC)    takes Metformin and Hum R daily (04/18/2018)   Walker as ambulation aid    also uses wheelchair    Objective: No conversational dyspnea Age appropriate judgment and insight Nml affect and mood  Assessment and Plan: Other chronic pain  Chronic, tentatively stable.  Continue oxycodone 20 mg 3 times daily as needed.  Will reach out to her pain team to update them on the situation so there are no discrepancies with her pain contract situation.  I will see her as originally scheduled. The patient voiced understanding and agreement to the plan.  Jilda Roche [place], DO 11/19/23  3:26 PM

## 2023-11-19 NOTE — Telephone Encounter (Signed)
 Copied from CRM [id number]. Topic: Clinical - Home Health Verbal Orders >> Nov 19, 2023 10:44 AM Desma Mcgregor wrote: Caller/Agency: [facility] Callback Number: [phone] Service Requested: Higher level of care, transfer to a facility Frequency: n/a Any new concerns about the patient? Yes. Tobi Bastos stated that the patient has additional wounds to her labia. She started with 2 now she has about 6 wounds. The husband is not taking care of her and its going to get worse with no care. Tobi Bastos stated they are going to discharge/refer the patient to APS to help out. So doctor may need to transfer to a facility. Please follow up asap

## 2023-11-21 ENCOUNTER — Telehealth: Payer: Self-pay

## 2023-11-21 NOTE — Telephone Encounter (Signed)
 Copied from CRM [id number]. Topic: General - Other >> Nov 21, 2023 11:06 AM Drema Balzarine wrote: Reason for CRM: [facility] called to inform provider that patient is being discharged with increasing wounds and foley catheter. Call back number [phone].

## 2023-11-22 NOTE — Telephone Encounter (Signed)
 I am unfamiliar with the logistics of what this patient needs. I have never been under the impression that her spouse was not caring for her so never thought to contact APS. What does she need? Her main items of cocern are wound care, chronic indwelling foley 2/2 mobility issues, and of course mobility issues.

## 2023-11-22 NOTE — Telephone Encounter (Signed)
 Has she seen urology yet? Also, if wounds are worsening, we need to involve the wound care team. Perhaps the social work team can help [place] with transportation, please place referral. Ty.

## 2023-11-22 NOTE — Telephone Encounter (Signed)
 Pt had an appt with urology on 3/6//25 & 11/13/23 and cancelled and then has an upcoming appt with wound care on 12/31/2023

## 2023-11-23 NOTE — Telephone Encounter (Signed)
 Spoke with Tobi Bastos at [facility] [phone]).  Tobi Bastos shared the history of patient care for Foley Cath and wound care.  She reports patient originally had 4 wounds (left breast, mid abdomen, mid dorsum and left perineum) and now has at lest 2 more, one being a golf ball size wound on her left labia.  Tobi Bastos reports, in the past scheduled HH visits, patient has been well kept, clean and respectful.  Recently during an unscheduled visit, patient was found to be "drenched with blood" under bottom.   Per Illene Labrador offered the family APS for support and services, at that point the patient and spouse became disrespectful, stating she had gone through 4 other agencies and has been septic 8 times.  The patient also became disrespectful and rude regarding Anna's personal family.  At this point, patients spouse "fired" the agency and was told to "not come back".   Tobi Bastos advised patient and spouse, she would be reporting to PCP and APS that the patient continues to have a foley catheter and unhealed wounds with no nursing services at home.   Patient does have a relationship with [facility] Mick Sell Clemons), will copy her for further guidance and social worker involvement.

## 2023-11-26 ENCOUNTER — Ambulatory Visit: Payer: Self-pay

## 2023-11-26 ENCOUNTER — Telehealth: Payer: Self-pay | Admitting: Cardiology

## 2023-11-26 NOTE — Telephone Encounter (Signed)
 Patient states she received  letter dated 3/05 that said her relationship with Dr. Mayford Knife has been compromised and [facility] providers are no longer able to provide care. She would like an explanation and to know if there is anything that she can do to rectify the situation. Please advise.

## 2023-11-26 NOTE — Patient Instructions (Signed)
 Visit Information  Thank you for taking time to visit with me today. Please don't hesitate to contact me if I can be of assistance to you.   Following are the goals we discussed today:  Patient will contact [facility] to obtain Home Health Services. Patient will contact Cardiologist regarding discharge and referral. Patient agreed to work with [facility] RN.   Our next appointment is by telephone on 11/26/23 at 10:00am  Please call the care guide team at [phone] if you need to cancel or reschedule your appointment.   If you are experiencing a Mental Health or Behavioral Health Crisis or need someone to talk to, please call 911  Patient verbalizes understanding of instructions and care plan provided today and agrees to view in MyChart. Active MyChart status and patient understanding of how to access instructions and care plan via MyChart confirmed with patient.     Telephone follow up appointment with care management team member scheduled for:11/26/23 at 10am.   Lysle Morales, BSW [place]  [facility], [facility] Social Worker Direct Dial: [phone]  Fax: [phone] Website: [email or web address]

## 2023-11-26 NOTE — Patient Outreach (Signed)
 Care Coordination   Follow Up Visit Note   11/26/2023 Name: CHASITIE PASSEY MRN: [id number] DOB: March 30, 1961  Miachel Roux Macnair is a 63 y.o. year old female who sees Carmelia Roller, Jilda Roche, DO for primary care. I spoke with  Kurstyn M Mascio by phone today.  What matters to the patients health and wellness today?  Patient needs to obtain a new Home Health Provider.  Adult Protective Services is involved to determine risk.  Patient feels that she is able to remain in her home with Home Health services and a hoyer lift.  Patient feels that once she has a hoyer lift, she will be able to use transportation to go to medical visits.  Patient is willing to work with Forensic psychologist.  Patient was discharged from cardiologist and will follow up with the providers office regarding the discharge and to request a referral.      Goals Addressed             This Visit's Progress    Care Coordination Activities       Interventions Today    Flowsheet Row Most Recent Value  Chronic Disease   Chronic disease during today's visit Diabetes, Congestive Heart Failure (CHF), Hypertension (HTN), Atrial Fibrillation (AFib)  General Interventions   General Interventions Discussed/Reviewed General Interventions Discussed, General Interventions Reviewed, Communication with  Communication with --  [SW t/c [employer] & request a list of Homw Health providers. [facility] is the only provider listed & is pts preference.Pt will contact [facility] today to request services. [facility] is listed for DME & pt will contact provider for hoyer lift orders.]              SDOH assessments and interventions completed:  No     Care Coordination Interventions:  Yes, provided   Follow up plan: Follow up call scheduled for 11/26/23 at 10am.    Encounter Outcome:  Patient Visit Completed

## 2023-11-27 ENCOUNTER — Telehealth: Payer: Self-pay | Admitting: Emergency Medicine

## 2023-11-27 DIAGNOSIS — S78119A Complete traumatic amputation at level between unspecified hip and knee, initial encounter: Secondary | ICD-10-CM

## 2023-11-27 NOTE — Telephone Encounter (Signed)
 Copied from CRM [id number]. Topic: General - Other >> Nov 27, 2023  3:54 PM Fredrich Romans wrote: Reason for CRM: Patient called in stating  [facility] dropped her as a client and she would like to know if she could have orders sent out to [facility] to come in for nursing,PT,OT.

## 2023-11-27 NOTE — Telephone Encounter (Signed)
 Routed to management, removing from triage

## 2023-11-27 NOTE — Telephone Encounter (Signed)
 Spoke with patient to discuss concerns. She reports that she has missed appointments due to a lack of transportation (states she requires transportation via ambulance), and due to a connective tissue disease which makes it difficult for her to move. Pt reports that she is working with a CSW through her insurance, as well as a English as a second language teacher through [employer]. She was recently dismissed by her [facility] agency and she plans to get established with a new one for PT/OT/RN services. Patient also reports that she is working with someone from [facility] to apply for Medicaid. Her portion of the application is due in April.   Advised pt that I will follow-up with her in the next couple of weeks regarding next steps. Pt verbalizes understanding and agreement with plan.

## 2023-11-28 ENCOUNTER — Other Ambulatory Visit: Payer: Self-pay

## 2023-11-28 ENCOUNTER — Telehealth: Payer: Self-pay | Admitting: Registered Nurse

## 2023-11-28 ENCOUNTER — Other Ambulatory Visit: Payer: Self-pay | Admitting: Family Medicine

## 2023-11-28 DIAGNOSIS — E559 Vitamin D deficiency, unspecified: Secondary | ICD-10-CM

## 2023-11-28 NOTE — Telephone Encounter (Signed)
 Referral placed.

## 2023-11-28 NOTE — Telephone Encounter (Signed)
 Patient left voice mail. Her PCP will take over her Oxycodone prescription for the next 3-4 months or until she can walk and come to her appointments with you. She wants to cancel pain contract and resume at a later time.

## 2023-11-28 NOTE — Addendum Note (Signed)
 Addended by: Maximino Sarin on: 11/28/2023 10:31 AM   Modules accepted: Orders

## 2023-11-29 ENCOUNTER — Other Ambulatory Visit

## 2023-11-29 ENCOUNTER — Other Ambulatory Visit: Payer: Self-pay | Admitting: Medical

## 2023-11-29 NOTE — Telephone Encounter (Signed)
 I have put in IT ticket to have the contract ended.

## 2023-11-30 ENCOUNTER — Other Ambulatory Visit: Payer: Self-pay

## 2023-11-30 NOTE — Patient Instructions (Signed)
 Visit Information  Thank you for taking time to visit with me today. Please don't hesitate to contact me if I can be of assistance to you before our next scheduled telephone appointment.  Following are the goals we discussed today:   Goals Addressed               This Visit's Progress     CCM Expected Outcome:  Monitor, Self-Manage and Reduce Symptoms of Diabetes (pt-stated)   Not on track     Interventions Today    Flowsheet Row Most Recent Value  Chronic Disease   Chronic disease during today's visit Diabetes, Hypertension (HTN), Congestive Heart Failure (CHF)  General Interventions   General Interventions Discussed/Reviewed General Interventions Reviewed, Durable Medical Equipment (DME), Community Resources, Doctor Visits  [reviewed blood sugar results from today:  fasting 138, high 206, low 99. discussed latest blood pressure readings]  Doctor Visits Discussed/Reviewed Doctor Visits Reviewed, PCP, Specialist  Durable Medical Equipment (DME) Other  [discussed process for getting [name] lift, advised to discuss with Home Health PT.  Informed patient she would need a face to face with PCP]  PCP/Specialist Visits Compliance with follow-up visit  [discussed importance of medical appointment adherence]  Education Interventions   Education Provided Provided Education  [verbal education provided regareding importance of increasing strength and controlling blood sugars]  Provided Verbal Education On Blood Sugar Monitoring, Mental Health/Coping with Illness  [discussed monitoring blood pressure, taking daily weight and attend cardiology appointments]  Mental Health Interventions   Mental Health Discussed/Reviewed Mental Health Discussed  [Patient encouraged to attend newly scheduled MH appointment]  Nutrition Interventions   Nutrition Discussed/Reviewed Nutrition Discussed  Pharmacy Interventions   Pharmacy Dicussed/Reviewed Medication Adherence, Pharmacy Topics Reviewed  [Informed patient  that a referral to clinic phamacist is available to discuss insulin dosage, patient declined]            CCM Expected Outcome:  Monitor, Self-Manage and Reduce Symptoms of Heart Failure (pt-stated)   Not on track     Interventions Today    Flowsheet Row Most Recent Value  Chronic Disease   Chronic disease during today's visit Diabetes, Hypertension (HTN), Congestive Heart Failure (CHF)  General Interventions   General Interventions Discussed/Reviewed General Interventions Reviewed, Durable Medical Equipment (DME), Community Resources, Doctor Visits  [reviewed blood sugar results from today:  fasting 138, high 206, low 99. discussed latest blood pressure readings]  Doctor Visits Discussed/Reviewed Doctor Visits Reviewed, PCP, Specialist  Durable Medical Equipment (DME) Other  [discussed process for getting [name] lift, advised to discuss with Home Health PT.  Informed patient she would need a face to face with PCP]  PCP/Specialist Visits Compliance with follow-up visit  [discussed importance of medical appointment adherence]  Education Interventions   Education Provided Provided Education  [verbal education provided regareding importance of increasing strength and controlling blood sugars]  Provided Verbal Education On Blood Sugar Monitoring, Mental Health/Coping with Illness  [discussed monitoring blood pressure, taking daily weight and attend cardiology appointments]  Mental Health Interventions   Mental Health Discussed/Reviewed Mental Health Discussed  [Patient encouraged to attend newly scheduled MH appointment]  Nutrition Interventions   Nutrition Discussed/Reviewed Nutrition Discussed  Pharmacy Interventions   Pharmacy Dicussed/Reviewed Medication Adherence, Pharmacy Topics Reviewed  [Informed patient that a referral to clinic phamacist is available to discuss insulin dosage, patient declined]              Our next appointment is by telephone on 12/10/2023 at 3:00pm  Please  call the care  guide team at [phone] if you need to cancel or reschedule your appointment.   If you are experiencing a Mental Health or Behavioral Health Crisis or need someone to talk to, please call the Suicide and Crisis Lifeline: 988 call the [place] National Suicide Prevention Lifeline: 7253165462 or TTY: 249-367-8046 TTY 774-658-9085) to talk to a trained counselor call 1-800-273-TALK (toll free, 24 hour hotline)    Ruel Favors BSN RN CCM   [facility], [facility] Health RN Care Manager Direct Dial: [phone] Fax: [phone]

## 2023-11-30 NOTE — Patient Outreach (Signed)
 Care Coordination   Follow Up Visit Note   11/30/2023 Name: [name] MRN: [id number] DOB: 01/25/1961  [name] is a 63 y.o. year old female who sees [name], [name] Roche, DO for primary care. I spoke with  [name] by phone today.  What matters to the patients health and wellness today?  Patient wants to regain strength and mobility by working with Home Health Physical Therapy. Patient short term goal is to be able to transfer from bed to chair without the need for a [facility] lift.    Goals Addressed               This Visit's Progress     CCM Expected Outcome:  Monitor, Self-Manage and Reduce Symptoms of Diabetes (pt-stated)   Not on track     Interventions Today    Flowsheet Row Most Recent Value  Chronic Disease   Chronic disease during today's visit Diabetes, Hypertension (HTN), Congestive Heart Failure (CHF)  General Interventions   General Interventions Discussed/Reviewed General Interventions Reviewed, Durable Medical Equipment (DME), Community Resources, Doctor Visits  [reviewed blood sugar results from today:  fasting 138, high 206, low 99. discussed latest blood pressure readings]  Doctor Visits Discussed/Reviewed Doctor Visits Reviewed, PCP, Specialist  Durable Medical Equipment (DME) Other  [discussed process for getting [name] lift, advised to discuss with Home Health PT.  Informed patient she would need a face to face with PCP]  PCP/Specialist Visits Compliance with follow-up visit  [discussed importance of medical appointment adherence]  Education Interventions   Education Provided Provided Education  [verbal education provided regareding importance of increasing strength and controlling blood sugars]  Provided Verbal Education On Blood Sugar Monitoring, Mental Health/Coping with Illness  [discussed monitoring blood pressure, taking daily weight and attend cardiology appointments]  Mental Health Interventions   Mental Health Discussed/Reviewed Mental  Health Discussed  [Patient encouraged to attend newly scheduled MH appointment]  Nutrition Interventions   Nutrition Discussed/Reviewed Nutrition Discussed  Pharmacy Interventions   Pharmacy Dicussed/Reviewed Medication Adherence, Pharmacy Topics Reviewed  [Informed patient that a referral to clinic phamacist is available to discuss insulin dosage, patient declined]            CCM Expected Outcome:  Monitor, Self-Manage and Reduce Symptoms of Heart Failure (pt-stated)   Not on track     Interventions Today    Flowsheet Row Most Recent Value  Chronic Disease   Chronic disease during today's visit Diabetes, Hypertension (HTN), Congestive Heart Failure (CHF)  General Interventions   General Interventions Discussed/Reviewed General Interventions Reviewed, Durable Medical Equipment (DME), Community Resources, Doctor Visits  [reviewed blood sugar results from today:  fasting 138, high 206, low 99. discussed latest blood pressure readings]  Doctor Visits Discussed/Reviewed Doctor Visits Reviewed, PCP, Specialist  Durable Medical Equipment (DME) Other  [discussed process for getting [name] lift, advised to discuss with Home Health PT.  Informed patient she would need a face to face with PCP]  PCP/Specialist Visits Compliance with follow-up visit  [discussed importance of medical appointment adherence]  Education Interventions   Education Provided Provided Education  [verbal education provided regareding importance of increasing strength and controlling blood sugars]  Provided Verbal Education On Blood Sugar Monitoring, Mental Health/Coping with Illness  [discussed monitoring blood pressure, taking daily weight and attend cardiology appointments]  Mental Health Interventions   Mental Health Discussed/Reviewed Mental Health Discussed  [Patient encouraged to attend newly scheduled MH appointment]  Nutrition Interventions   Nutrition Discussed/Reviewed Nutrition Discussed  Pharmacy Interventions  Pharmacy Dicussed/Reviewed Medication Adherence, Pharmacy Topics Reviewed  [Informed patient that a referral to clinic phamacist is available to discuss insulin dosage, patient declined]              SDOH assessments and interventions completed:  No     Care Coordination Interventions:  Yes, provided   Follow up plan: Follow up call scheduled for 12/10/23 at 3:00 pm    Encounter Outcome:  Patient Visit Completed    [name] BSN RN CCM [place]  [facility], [facility] RN Care Manager Direct Dial: [phone] Fax: [phone]

## 2023-11-30 NOTE — Patient Outreach (Signed)
  Care Management   Visit Note  11/30/2023 Name: [name] MRN: [id number] DOB: Nov 10, 1960  Subjective: [name] is a 63 y.o. year old female who is a primary care patient of Sharlene Dory, DO. The Care Management team was consulted for assistance.      Engaged with patient spoke with patient by telephone.   Assessment:   Interventions: Advised patient to contact cardiology provider to discuss dismissal from clinic.  Patient will contact [facility] agency to schedule for new service.  RN requested PCP to place DME order for [facility] lift at patients request, provder responded that patient will need to schedule in person appointment in order to be assessed for medical necessity.   Reviewed medications with patient and discussed monitoring blood sugars and adjusting dosage based on sliding scale.  Patient has been "experimenting" with dosage of  Humulin, agreed to discuss outcome at next CCM visit, and possible referral to Pharmacy.   Plan: Telephone follow up appointment with care management team member scheduled for: 11/30/23.  Patient will outreach to [facility] agency to initiate [facility].  Patient will contact cardiology provider to discuss dismissal.  Patient will need to schedule visit with PCP office for assessment regarding [facility] lift.  Ruel Favors BSN RN CCM [place]  [facility], [facility] Health RN Care Manager Direct Dial: [phone] Fax: [phone]

## 2023-11-30 NOTE — Patient Outreach (Signed)
 Care Coordination   Follow Up Visit Note   11/30/2023 Name: [name] MRN: [id number] DOB: 01/25/1961  [name] is a 63 y.o. year old female who sees [name], [name] Roche, DO for primary care. I spoke with  [name] by phone today.  What matters to the patients health and wellness today?  Patient wants to regain strength and mobility by working with Home Health Physical Therapy. Patient short term goal is to be able to transfer from bed to chair without the need for a [facility] lift.    Goals Addressed               This Visit's Progress     CCM Expected Outcome:  Monitor, Self-Manage and Reduce Symptoms of Diabetes (pt-stated)   Not on track     Interventions Today    Flowsheet Row Most Recent Value  Chronic Disease   Chronic disease during today's visit Diabetes, Hypertension (HTN), Congestive Heart Failure (CHF)  General Interventions   General Interventions Discussed/Reviewed General Interventions Reviewed, Durable Medical Equipment (DME), Community Resources, Doctor Visits  [reviewed blood sugar results from today:  fasting 138, high 206, low 99. discussed latest blood pressure readings]  Doctor Visits Discussed/Reviewed Doctor Visits Reviewed, PCP, Specialist  Durable Medical Equipment (DME) Other  [discussed process for getting [name] lift, advised to discuss with Home Health PT.  Informed patient she would need a face to face with PCP]  PCP/Specialist Visits Compliance with follow-up visit  [discussed importance of medical appointment adherence]  Education Interventions   Education Provided Provided Education  [verbal education provided regareding importance of increasing strength and controlling blood sugars]  Provided Verbal Education On Blood Sugar Monitoring, Mental Health/Coping with Illness  [discussed monitoring blood pressure, taking daily weight and attend cardiology appointments]  Mental Health Interventions   Mental Health Discussed/Reviewed Mental  Health Discussed  [Patient encouraged to attend newly scheduled MH appointment]  Nutrition Interventions   Nutrition Discussed/Reviewed Nutrition Discussed  Pharmacy Interventions   Pharmacy Dicussed/Reviewed Medication Adherence, Pharmacy Topics Reviewed  [Informed patient that a referral to clinic phamacist is available to discuss insulin dosage, patient declined]            CCM Expected Outcome:  Monitor, Self-Manage and Reduce Symptoms of Heart Failure (pt-stated)   Not on track     Interventions Today    Flowsheet Row Most Recent Value  Chronic Disease   Chronic disease during today's visit Diabetes, Hypertension (HTN), Congestive Heart Failure (CHF)  General Interventions   General Interventions Discussed/Reviewed General Interventions Reviewed, Durable Medical Equipment (DME), Community Resources, Doctor Visits  [reviewed blood sugar results from today:  fasting 138, high 206, low 99. discussed latest blood pressure readings]  Doctor Visits Discussed/Reviewed Doctor Visits Reviewed, PCP, Specialist  Durable Medical Equipment (DME) Other  [discussed process for getting [name] lift, advised to discuss with Home Health PT.  Informed patient she would need a face to face with PCP]  PCP/Specialist Visits Compliance with follow-up visit  [discussed importance of medical appointment adherence]  Education Interventions   Education Provided Provided Education  [verbal education provided regareding importance of increasing strength and controlling blood sugars]  Provided Verbal Education On Blood Sugar Monitoring, Mental Health/Coping with Illness  [discussed monitoring blood pressure, taking daily weight and attend cardiology appointments]  Mental Health Interventions   Mental Health Discussed/Reviewed Mental Health Discussed  [Patient encouraged to attend newly scheduled MH appointment]  Nutrition Interventions   Nutrition Discussed/Reviewed Nutrition Discussed  Pharmacy Interventions  Pharmacy Dicussed/Reviewed Medication Adherence, Pharmacy Topics Reviewed  [Informed patient that a referral to clinic phamacist is available to discuss insulin dosage, patient declined]              SDOH assessments and interventions completed:  No     Care Coordination Interventions:  Yes, provided   Follow up plan: Follow up call scheduled for 12/10/23 at 3:00 pm    Encounter Outcome:  Patient Visit Completed    [name] BSN RN CCM [name]  [facility], [facility] RN Care Manager Direct Dial: [phone] Fax: [phone]

## 2023-12-03 ENCOUNTER — Ambulatory Visit: Payer: Self-pay

## 2023-12-03 NOTE — Patient Outreach (Signed)
 Care Coordination   Follow Up Visit Note   12/03/2023 Name: [name] MRN: [id number] DOB: 1961-03-25  [name] is a 63 y.o. year old female who sees Carmelia Roller, Jilda Roche, DO for primary care. I spoke with  [name] by phone today.  What matters to the patients health and wellness today?  Patient has been about to obtain home health services.  Patient is working with cardiologist and other options for a hoyer lift.  Patient reports no other unmet needs at this time.    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 Having excessive clutter is a safety risk for both patient and professionals in the home.  Potential for fall and fire hazard. Concern for clean environment for patient.]              SDOH assessments and interventions completed:  No     Care Coordination Interventions:  Yes, provided   Follow up plan: No further intervention required.   Encounter Outcome:  Patient Visit Completed

## 2023-12-03 NOTE — Patient Instructions (Signed)
 Visit Information  Thank you for taking time to visit with me today. Please don't hesitate to contact me if I can be of assistance to you.   Following are the goals we discussed today:  Patient achieved goal and will continue to work with VBCI-RN CM.   If you are experiencing a Mental Health or Behavioral Health Crisis or need someone to talk to, please call 911  Patient verbalizes understanding of instructions and care plan provided today and agrees to view in MyChart. Active MyChart status and patient understanding of how to access instructions and care plan via MyChart confirmed with patient.     No further follow up required: Patient does not request a follow up visit.   Lysle Morales, BSW [place]  [facility], [facility] Social Worker Direct Dial: [phone]  Fax: [phone] Website: [email or web address]

## 2023-12-06 ENCOUNTER — Other Ambulatory Visit: Payer: Self-pay | Admitting: Family Medicine

## 2023-12-06 DIAGNOSIS — E559 Vitamin D deficiency, unspecified: Secondary | ICD-10-CM

## 2023-12-10 ENCOUNTER — Other Ambulatory Visit: Payer: Self-pay

## 2023-12-10 DIAGNOSIS — Z794 Long term (current) use of insulin: Secondary | ICD-10-CM

## 2023-12-10 NOTE — Patient Instructions (Signed)
 Visit Information  Thank you for taking time to visit with me today. Please don't hesitate to contact me if I can be of assistance to you before our next scheduled telephone appointment.  Following are the goals we discussed today:  (Copy and paste patient goals from clinical care plan here)  Our next appointment is by telephone on 12/24/23 at 3:00 pm  Please call the care guide team at [phone] if you need to cancel or reschedule your appointment.   If you are experiencing a Mental Health or Behavioral Health Crisis or need someone to talk to, please call the Suicide and Crisis Lifeline: 988 call the [place] National Suicide Prevention Lifeline: 540-758-3146 or TTY: 787-572-7658 TTY 239-261-5019) to talk to a trained counselor call 1-800-273-TALK (toll free, 24 hour hotline)     Be expecting a call from the Care [place]aqement Team to schedule a visit with a [facility]BCI Clinical Pharmacist.   Ruel Favors BSN RN CCM [place]  [facility], [facility] Health RN Care Manager Direct Dial: [phone] Fax: [phone]

## 2023-12-10 NOTE — Patient Outreach (Signed)
 Care Coordination   Follow Up Visit Note   12/10/2023 Name: [name] MRN: [id number] DOB: November 22, 1960  [name] is a 63 y.o. year old female who sees Carmelia Roller, Jilda Roche, DO for primary care. I spoke with  [name] by phone today.  What matters to the patients health and wellness today?  Improving mobility and controlling blood sugars.  Patient is working with Home Health PT, was able to stand independently for 10 seconds today.  Patient reports wounds to feet have improved by 50%.  Patient still having difficulty with BS management and Dexcom monitoring. Patient agreed to and [name] placed referral to [facility]BCI Clinical Pharmacist.     Goals Addressed               This Visit's Progress     CCM Expected Outcome:  Monitor, Self-Manage and Reduce Symptoms of Diabetes (pt-stated)        Interventions Today    Flowsheet Row Most Recent Value  Chronic Disease   Chronic disease during today's visit Diabetes, Chronic Obstructive Pulmonary Disease (COPD)  General Interventions   General Interventions Discussed/Reviewed General Interventions Reviewed, Durable Medical Equipment (DME), Doctor Visits  Doctor Visits Discussed/Reviewed Doctor Visits Reviewed  Durable Medical Equipment (DME) Other  [patient states has gotten much stronger, working toward standing, agrees to not obtain [employer] lift for now]  PCP/Specialist Visits Compliance with follow-up visit  Communication with Pharmacists  Exercise Interventions   Exercise Discussed/Reviewed Physical Activity  [Discussed Home Health PT and HEP adherence]  Physical Activity Discussed/Reviewed Physical Activity Reviewed, Home Exercise Program (HEP)  Education Interventions   Education Provided Provided Education  [discussed use of antiinflammatories]  Provided Verbal Education On Foot Care, When to see the doctor, Medication, Blood Sugar Monitoring  [patient states that wounds to feet have decreased by half, and she continues to  receive [facility] RN care]  Pharmacy Interventions   Pharmacy Dicussed/Reviewed Pharmacy Topics Reviewed, Medication Adherence, Referral to Pharmacist  [patient states fasting BP 127, high 3/30 was 308  patient having issues being able to read BS from Dexcom from more than 24 hours]  Referral to Pharmacist --  [needs help working with Dex com monitor]  Safety Interventions   Safety Discussed/Reviewed Fall Risk  [discussed patient safety with transfers from bed to wheelchair]              SDOH assessments and interventions completed:  Yes  SDOH Interventions Today    Flowsheet Row Most Recent Value  SDOH Interventions   Food Insecurity Interventions Intervention Not Indicated  Housing Interventions Intervention Not Indicated  Transportation Interventions Intervention Not Indicated        Care Coordination Interventions:  Yes, provided   Follow up plan: Referral made to [facility]BCI Clinical Pharmacist    Encounter Outcome:  Patient Visit Completed    Ruel Favors BSN RN CCM [place]  [facility], [facility] Health RN Care Manager Direct Dial: [phone] Fax: [phone]

## 2023-12-12 ENCOUNTER — Telehealth: Payer: Self-pay

## 2023-12-12 NOTE — Progress Notes (Signed)
 Complex Care Management Note  Care Guide Note 12/12/2023 Name: EMALINA DUBREUIL MRN: [id number] DOB: [birth year]  Miachel Roux Teichert is a 63 y.o. year old female who sees Carmelia Roller, Jilda Roche, DO for primary care. I reached out to [facility] by phone today to offer complex care management services.  Ms. Mccay was given information about Complex Care Management services today including:   The Complex Care Management services include support from the care team which includes your Nurse Care Manager, Clinical Social Worker, or Pharmacist.  The Complex Care Management team is here to help remove barriers to the health concerns and goals most important to you. Complex Care Management services are voluntary, and the patient may decline or stop services at any time by request to their care team member.   Complex Care Management Consent Status: Patient agreed to services and verbal consent obtained.   Follow up plan:  Telephone appointment with complex care management team member scheduled for:  01/02/24 at 3:00 p.m.  Encounter Outcome:  Patient Scheduled  [facility]  [facility], [facility] Health Care Management Assistant Direct Dial: [phone]  Fax: [phone]

## 2023-12-19 ENCOUNTER — Other Ambulatory Visit: Payer: Self-pay | Admitting: Family Medicine

## 2023-12-19 MED ORDER — OXYCODONE HCL 20 MG PO TABS: 20.0000 mg | ORAL_TABLET | Freq: Three times a day (TID) | ORAL | 0 refills | Status: DC

## 2023-12-21 ENCOUNTER — Telehealth: Payer: Self-pay

## 2023-12-21 ENCOUNTER — Telehealth: Payer: Self-pay | Admitting: Cardiology

## 2023-12-21 NOTE — Telephone Encounter (Signed)
 Received call from Tera Helper with [employer] requesting orders regarding at-home ventilator. Advised her that our office has not been involved in treating any respiratory or pulmonary conditions and recommended they reach out to patient's PCP.   Linna Hoff, BSN, RN

## 2023-12-21 NOTE — Telephone Encounter (Signed)
 Reached out to my direct Environmental education officer about this message and how to handle this call accordingly.  See letter sent to the pt on 3/5 for further reference.   Will direct this call to [facility] Supervisor for further management, when she returns to the office.

## 2023-12-21 NOTE — Telephone Encounter (Signed)
 [employer] needs a discontinue order for the ventilator. Pt has had it since Jan and has been non-compliant. Please fax to [phone]

## 2023-12-24 ENCOUNTER — Other Ambulatory Visit: Payer: Self-pay

## 2023-12-24 ENCOUNTER — Other Ambulatory Visit (HOSPITAL_COMMUNITY): Payer: Self-pay

## 2023-12-24 ENCOUNTER — Telehealth: Payer: Self-pay | Admitting: Physical Medicine and Rehabilitation

## 2023-12-24 ENCOUNTER — Telehealth: Payer: Self-pay

## 2023-12-24 ENCOUNTER — Other Ambulatory Visit: Payer: Self-pay | Admitting: Physical Medicine and Rehabilitation

## 2023-12-24 ENCOUNTER — Other Ambulatory Visit: Payer: Self-pay | Admitting: Family Medicine

## 2023-12-24 VITALS — BP 128/75

## 2023-12-24 NOTE — Patient Instructions (Signed)
 Visit Information  Thank you for taking time to visit with me today. Please don't hesitate to contact me if I can be of assistance to you before our next scheduled appointment.  Our next appointment is by telephone on 01/09/24 at 2:00 Please call the care guide team at [phone] if you need to cancel or reschedule your appointment.   Following is a copy of your care plan:   Goals Addressed             This Visit's Progress    VBCI RN Care Plan       Problems:  Chronic Disease Management support and education needs related to DMII and HTN  Goal: Over the next 3 months the Patient will attend all scheduled medical appointments: 12/31/23 with Wound Care provider, 01/02/24 with VBCI Clinical Pharmacist as evidenced by chart review and patient report        continue to work with RN Care Manager and/or Social Worker to address care management and care coordination needs related to DMII and HTN as evidenced by adherence to CM Team Scheduled appointments     demonstrate Ongoing adherence to prescribed treatment plan for DMII as evidenced by proving fasting and post prandial blood sugar readings keeping with goal range of fasting <130, post prandial <180, and working with [facility] PT to meet goal of improved mobility, able to transfer from bed to chair with assistance.  Interventions:   Diabetes Interventions: Assessed patient's understanding of A1c goal: <6.5% Provided education to patient about basic DM disease process Reviewed medications with patient and discussed importance of medication adherence Counseled on importance of regular laboratory monitoring as prescribed Discussed plans with patient for ongoing care management follow up and provided patient with direct contact information for care management team Advised patient, providing education and rationale, to check cbg using CBG montior and record, calling PCP for findings outside established parameters Encouraged patient to continue to  work with Home Health PT to reach her goal of being able to transfer from bed to wheel chair, which will enable her to return to in person medical visits. Lab Results  Component Value Date   HGBA1C 8.0 (H) 09/10/2023    Patient [name] Activities:  Attend all scheduled provider appointments Call pharmacy for medication refills 3-7 days in advance of running out of medications Call provider office for new concerns or questions  Perform all self care activities independently  Take medications as prescribed   Work with the pharmacist to address medication management needs and will continue to work with the clinical team to address health care and disease management related needs  Plan:  Telephone follow up appointment with care management team member scheduled for:  01/09/24 at 2:00             Please call the Suicide and Crisis Lifeline: 988 call the [place] National Suicide Prevention Lifeline: 845-117-2687 or TTY: 320 645 4302 TTY 763-611-5373) to talk to a trained counselor call 1-800-273-TALK (toll free, 24 hour hotline) if you are experiencing a Mental Health or Behavioral Health Crisis or need someone to talk to.  Patient verbalizes understanding of instructions and care plan provided today and agrees to view in MyChart. Active MyChart status and patient understanding of how to access instructions and care plan via MyChart confirmed with patient.      Ruel Favors BSN RN CCM [place]  [facility] RN Care Manager Direct Dial: [phone] Fax: [phone]

## 2023-12-24 NOTE — Patient Outreach (Signed)
 Complex Care Management   Visit Note  12/24/2023  Name:  [name] MRN: [id number] DOB: 06/01/1961  Situation: Referral received for Complex Care Management related to Diabetes with Complications I obtained verbal consent from Patient.  Visit completed with patient  on the phone  Background:   Past Medical History:  Diagnosis Date   Aortic stenosis    mild AS by echo 02/2021   Asthma    "on daily RX and rescue inhaler" (04/18/2018)   Chronic diastolic CHF (congestive heart failure) (HCC) 09/2015   Chronic lower back pain    Chronic neck pain    Chronic pain syndrome    Fentanyl Patch   Colon polyps    Coronary artery disease    cath with normal LM, 30% LAD, 85% mid RCA and 95% distal RCA s/p PCI of the mid to distal RCA and now on DAPT with ASA and Ticagrelor.     Eczema    Excessive daytime sleepiness 11/26/2015   Fibromyalgia    Gallstones    GERD (gastroesophageal reflux disease)    Heart murmur    "noted for the 1st time on 04/18/2018"   History of blood transfusion 07/2010   "S/P oophorectomy"   History of gout    History of hiatal hernia 1980s   "gone now" (04/18/2018)   Hyperlipidemia    Hypertension    takes Metoprolol and Enalapril daily   Hypothyroidism    takes Synthroid daily   IBS (irritable bowel syndrome)    Migraine    "nothing in the 2000s" (04/18/2018)   Mixed connective tissue disease (HCC)    NAFLD (nonalcoholic fatty liver disease)    Pneumonia    "several times" (04/18/2018)   PVC's (premature ventricular contractions)    noted on event monitor 02/2021   Rheumatoid arthritis (HCC)    "hands, elbows, shoulders, probably knees" (04/18/2018)   Scoliosis    Sjogren's syndrome (HCC)    Sleep apnea    mild - does not use cpap   Spondylosis    Type II diabetes mellitus (HCC)    takes Metformin and Hum R daily (04/18/2018)   Walker as ambulation aid    also uses wheelchair    Assessment: Patient Reported Symptoms:  Cognitive Cognitive Status: Alert  and oriented to person, place, and time      Neurological    Dizziness, patient attributes to lack of sleep  HEENT HEENT Symptoms Reported: Eye discharge, Nasal discharge, Runny nose, Sore throat (attributed to allergies) HEENT Management Strategies: Medication therapy    Cardiovascular Cardiovascular Symptoms Reported: No symptoms reported, Swelling in legs or feet Does patient have uncontrolled Hypertension?: No (BP 128/75) Cardiovascular Conditions: Hypertension Cardiovascular Management Strategies: Medication therapy, Routine screening, Diet modification Cardiovascular Self-Management Outcome: 3 (uncertain)  Respiratory Respiratory Symptoms Reported: Wheezing Additional Respiratory Details: allergy induced Respiratory Conditions: Seasonal allergies  Endocrine Is patient diabetic?: Yes Is patient checking blood sugars at home?: Yes Endocrine Management Strategies: Medication therapy, Medical device, Diet modification, Weight management, Routine screening Endocrine Self-Management Outcome: 2 (bad)  Gastrointestinal Gastrointestinal Symptoms Reported: Constipation Gastrointestinal Conditions: Reflux/heartburn (instructed patient to contact PCP to discuss GI referral) Gastrointestinal Self-Management Outcome: 4 (good) Nutrition Risk Screen (CP): No indicators present  Genitourinary   Genitourinary Management Strategies: Catheter, indwelling  Integumentary Integumentary Symptoms Reported: Wound Additional Integumentary Details: P/U to left foot improving, currently has 4 wounds on buttocks and genitals  currently improved Skin Conditions: Eczema, Pressure injury Skin Management Strategies: Adequate rest, Diet modification, Dressing changes,  Fluid modification, Medication therapy, Routine screening Skin Self-Management Outcome: 4 (good)  Musculoskeletal   Musculoskeletal Conditions: Amputation, Joint pain, Mobility limited Musculoskeletal Management Strategies: Medication  therapy Musculoskeletal Self-Management Outcome: 2 (bad) Falls in the past year?: No Number of falls in past year: 1 or less    Psychosocial Psychosocial Symptoms Reported: Other Other Psychosocial Conditions: patient requetsted referral to LCSW because [facility] kept cancelling and not keeping appointments     Quality of Family Relationships: helpful, involved, supportive Do you feel physically threatened by others?: No      12/24/2023    3:48 PM  Depression screen PHQ 2/9  Decreased Interest 0  Down, Depressed, Hopeless 0  PHQ - 2 Score 0    Vitals:   12/23/23 1519  BP: 128/75    Medications Reviewed Today     Reviewed by Clarnce Crow, RN (Registered Nurse) on 12/24/23 at 1521  Med List Status: <None>   Medication Order Taking? Sig Documenting Provider Last Dose Status Informant  acetaminophen (TYLENOL) 500 MG tablet [id number] No Take 1,000 mg by mouth as needed for mild pain (pain score 1-3). [provider] Taking Active Self, Pharmacy Records  albuterol (PROVENTIL) (2.5 MG/3ML) 0.083% nebulizer solution [id number] No USE 1 VIAL IN NEBULIZER EVERY 4 HOURS AS NEEDED FOR WHEEZING FOR SHORTNESS OF BREATH Wendling, Shellie Dials, DO Taking Active Self, Pharmacy Records  albuterol (VENTOLIN HFA) 108 (90 Base) MCG/ACT inhaler [id number] No INHALE 2 PUFFS BY MOUTH EVERY 6 HOURS AS NEEDED FOR WHEEZING FOR SHORTNESS OF BREATH  Patient taking differently: Inhale 2 puffs into the lungs every 4 (four) hours as needed for wheezing or shortness of breath.   Jobe Mulder, DO Taking Active Self, Pharmacy Records  augmented betamethasone dipropionate (DIPROLENE-AF) 0.05 % cream [id number] No APPLY 1 APPLICATION TOPICALLY TWICE DAILY AS NEEDED (RASH) Jobe Mulder, DO Taking Active   Continuous Glucose Sensor (DEXCOM G7 SENSOR) MISC [id number] No USE TO CHECK FOR BLOOD SUGAR CONTINUOUSLY- CHANGE SENSOR EVERY 10 DAYS Wendling, Shellie Dials, DO Taking Active Self,  Pharmacy Records  diclofenac Sodium (PENNSAID) 2 % SOLN [id number] No Apply 2 Pump (40 mg total) topically 2 (two) times daily as needed. 1 application to back twice daily Jobe Mulder, DO Taking Active   diltiazem (CARDIZEM CD) 360 MG 24 hr capsule [id number] No Take 1 capsule (360 mg total) by mouth daily. Jobe Mulder, DO Taking Active   diphenhydrAMINE 25 mg in sodium chloride 0.9 % 50 mL [id number] No Inject 25 mg into the vein as needed (allergy). [provider] Taking Active Self, Pharmacy Records           Med Note Anastasia Kasal Oct 08, 2023 12:03 PM) PRN for Anaphylaxis/allergic reaction  ELIQUIS 5 MG TABS tablet [id number]  Take 1 tablet by mouth twice daily Jobe Mulder, DO  Active   EPINEPHrine 0.3 mg/0.3 mL IJ SOAJ injection [id number] No Inject 0.3 mg into the muscle as needed for anaphylaxis. Jobe Mulder, DO Taking Active Self, Pharmacy Records           Med Note Randye Buttner   Mon Oct 08, 2023 12:03 PM) PRN for Anaphylaxis/allergic reaction  ferrous sulfate 325 (65 FE) MG tablet [id number] No Take 1 tablet (325 mg total) by mouth 2 (two) times daily with a meal. Jobe Mulder, DO Taking Active   fluticasone (FLONASE) 50 MCG/ACT nasal spray [id number] No USE 2 SPRAY(S) IN EACH NOSTRIL ONCE DAILY AS NEEDED FOR  ALLERGIES OR  RHINITIS  Patient taking differently: Place 2 sprays into both nostrils in the morning and at bedtime.   Sharlene Dory, DO Taking Active Self, Pharmacy Records  furosemide (LASIX) 40 MG tablet [id number] No Take 1 tablet (40 mg total) by mouth daily. Sharlene Dory, DO Taking Active   gabapentin (NEURONTIN) 400 MG capsule [id number] No Take 1 capsule (400 mg total) by mouth 4 (four) times daily. TAKE 1 CAPSULE BY MOUTH IN THE MORNING AND 1 AT NOON AND 2 AT BEDTIME Strength: 400 mg  Patient taking differently: Take 400-800 mg by mouth See admin instructions. Take 400 mg by mouth  in the morning and the afternoon. Take 800 mg by mouth at bedtime.   Horton Chin, MD Taking Active Self, Pharmacy Records           Med Note Julian Reil, Destane Speas   Mon Dec 10, 2023  3:20 PM) Patient taking as directed 1 400 mg tab in am, one at noon two at bedtime  gentamicin ointment (GARAMYCIN) 0.1 % [id number] No Apply 1 Application topically in the morning and at bedtime. [provider] Taking Active Self, Pharmacy Records  Glucagon, rDNA, (GLUCAGON EMERGENCY) 1 MG KIT [id number] No Inject 1 mg into the vein daily as needed. Sharlene Dory, DO Taking Active   HUMULIN R 500 UNIT/ML injection [id number] No INJECT 0.08-0.3 MLS (40-150 UNITS) INTO THE SKIN THREE TIMES DAILY WITH MEALS  Patient taking differently: Inject 5-15 Units into the skin in the morning, at noon, and at bedtime. Takes 5 units three times a day with meals.  Uses SSI on top of the scheduled insulin with 3-10 units depending on blood sugar level   Wendling, Jilda Roche, DO Taking Active Self, Pharmacy Records  INSULIN SYRINGE 1CC/29G (B-D INSULIN SYRINGE) 29G X 1/2" 1 ML MISC [id number] No Use three times daily with insulin.  DX E11.9 Sharlene Dory, DO Taking Active Self, Pharmacy Records  Insulin Syringes, Disposable, U-100 0.5 ML MISC [id number] No 100 each by Does not apply route 4 (four) times daily -  before meals and at bedtime. Miguel Rota, MD Taking Active   isosorbide mononitrate (IMDUR) 60 MG 24 hr tablet [id number] No Take 1 tablet (60 mg total) by mouth daily. Swinyer, Zachary George, NP Taking Active Self, Pharmacy Records  levocetirizine (XYZAL) 5 MG tablet [id number] No Take 1 tablet (5 mg total) by mouth every evening. Sharlene Dory, DO Taking Active   levothyroxine (SYNTHROID) 200 MCG tablet [id number] No TAKE 1 TABLET BY MOUTH ONCE DAILY BEFORE BREAKFAST Sharlene Dory, DO Taking Active   lidocaine (LIDODERM) 5 % [id number] No Place 1 patch onto the skin every 12 (twelve)  hours. Remove & Discard patch within 12 hours or as directed  Patient taking differently: Place 1 patch onto the skin daily as needed (pain). Remove & Discard patch within 12 hours or as directed   Jones Bales, NP Taking Active Self, Pharmacy Records  lidocaine (XYLOCAINE) 5 % ointment [id number] No Apply 1 Application topically in the morning and at bedtime. [provider] Taking Active Self, Pharmacy Records  metFORMIN (GLUCOPHAGE-XR) 500 MG 24 hr tablet [id number] No Take 2 tablets by mouth twice daily Wendling, Jilda Roche, DO Taking Active   Multiple Vitamins-Minerals (WOMENS 50+ MULTI VITAMIN PO) [id number] No Take 1 tablet by mouth in the morning and at bedtime. [provider] Taking Active Self, Pharmacy Records  mupirocin ointment (BACTROBAN) 2 % [id number] No Apply  topically 2 (two) times daily. Jobe Mulder, DO Taking Active   naloxone [facility]) nasal spray 4 mg/0.1 mL [id number] No Place 1 spray into the nose as needed (opoid overdose). Jobe Mulder, DO Taking Active   nitroGLYCERIN (NITROSTAT) 0.4 MG SL tablet [id number] No Place 1 tablet (0.4 mg total) under the tongue every 5 (five) minutes as needed for chest pain. Jobe Mulder, DO Taking Active   nystatin (MYCOSTATIN/NYSTOP) powder [id number] No Apply 1 Application topically as needed. [provider] Taking Active   omeprazole (PRILOSEC) 40 MG capsule [id number] No Take 1 capsule by mouth once daily Jobe Mulder, DO Taking Active   OneTouch Delica Lancets 33G MISC [id number] No Use to check blood glucose up to 4 times a day Jobe Mulder, DO Taking Active Self, Pharmacy Records  [facility] VERIO test strip [id number] No Use as instructed Jobe Mulder, DO Taking Active Self, Pharmacy Records  Oxycodone HCl 20 MG TABS [id number]  Take 1 tablet (20 mg total) by mouth in the morning, at noon, and at bedtime. Jobe Mulder, DO  Active    Polyethyl Glyc-Propyl Glyc PF (SYSTANE PRESERVATIVE FREE) 0.4-0.3 % SOLN [id number] No Place 2 drops into both eyes in the morning and at bedtime. [provider] Taking Active Self, Pharmacy Records  pramipexole (MIRAPEX) 1 MG tablet [id number]  TAKE 1 TABLET BY MOUTH THREE TIMES DAILY Jobe Mulder, DO  Active   pregabalin (LYRICA) 50 MG capsule [id number] No Take 1 capsule (50 mg total) by mouth 3 (three) times daily. Liam Redhead, MD Taking Active Self, Pharmacy Records  promethazine (PHENERGAN) 25 MG tablet [id number] No Take 1 tablet (25 mg total) by mouth every 8 (eight) hours as needed for vomiting or nausea.  Patient taking differently: Take 25 mg by mouth as needed for vomiting or nausea.   Jobe Mulder, DO Taking Active Self, Pharmacy Records  senna-docusate (SENOKOT-S) 8.6-50 MG tablet [id number] No Take 2 tablets by mouth at bedtime as needed for moderate constipation. Jobe Mulder, DO Taking Active   spironolactone (ALDACTONE) 50 MG tablet [id number]  Take 1 tablet by mouth once daily Jobe Mulder, DO  Active   Vitamin D, Ergocalciferol, (DRISDOL) 1.25 MG (50000 UNIT) CAPS capsule [id number] No Take 1 capsule by mouth once a week Jobe Mulder, DO Taking Active             Recommendation:   PCP Follow-up Referral to: LCSW  Follow Up Plan:   Telephone follow-up in two weeks. Telephone follow up appointment date/time:  01/09/24 at 2:00 Referral to Licensed Clinical Social Worker   Clarnce Crow BSN RN CCM [place]  [facility], [facility] RN Care Manager Direct Dial: [phone] Fax: [phone]

## 2023-12-24 NOTE — Telephone Encounter (Signed)
 Patient called to report for now she will not be receiving pain medication from us  for now. She will be getting it from her PCP until she is able to walk. At that time she will return to our office.

## 2023-12-24 NOTE — Telephone Encounter (Signed)
 Pharmacy Patient Advocate Encounter   Received notification from CoverMyMeds that prior authorization for Diclofenac Sodium 2% solution is required/requested.   Insurance verification completed.   The patient is insured through [employer] .   Per test claim: PA required and submitted KEY/EOC/Request #: BYAYMDEL NON FORMULARY CLOSE FORM.   TEST CLAIM HAS A CLOSE FORM. PLEASE BE ADVISED. WHEN THERE A CLOSED FORM. THE DRUG WOULD NOT BE COVERED WOULD LIKE TO PROCEED WITH PA?

## 2023-12-25 ENCOUNTER — Telehealth: Payer: Self-pay

## 2023-12-25 ENCOUNTER — Other Ambulatory Visit: Payer: Self-pay

## 2023-12-25 NOTE — Telephone Encounter (Signed)
 Per test claim: PA required; PA started via CoverMyMeds. KEY BYAYMDEL . Please see clinical question(s) below that I am not finding the answer to in her chart and advise. Please provide clinical rationale as to why preferred medication can't be used. Thank you

## 2023-12-25 NOTE — Telephone Encounter (Signed)
 Pharmacy Patient Advocate Encounter   Received notification from Pt Calls Messages that prior authorization for Diclofenac Sodium 2% solution is required/requested.   Insurance verification completed.   The patient is insured through [employer] .   Per test claim: PA required; PA started via CoverMyMeds. KEY BYAYMDEL . Please see clinical question(s) below that I am not finding the answer to in her chart and advise.   *Communicated to clinic team in original request

## 2023-12-25 NOTE — Telephone Encounter (Signed)
 Called pt was advised her insurance isn't covering the Diclofenac 2%. Pt stated that she  will use her Lidocaine 5% and she will reach out to them.

## 2023-12-26 ENCOUNTER — Other Ambulatory Visit: Payer: Self-pay | Admitting: Family Medicine

## 2023-12-26 NOTE — Telephone Encounter (Signed)
 Mandy returned call. Explained that order to discontinue will likely need to come from PCP. Mandy verbalized understanding and states she will reach out to PCP's office tomorrow.

## 2023-12-26 NOTE — Telephone Encounter (Signed)
 Per Dr. Renna Cary: "Would allow her a second chance. Let's however insure that social work and transportation is present for her."  Spoke with patient to provide update. She reports that she has been working with [facility] PT on strength and mobility and hopes to be able to transfer to her wheelchair and van in the next couple of months. She has not yet completed Medicaid application. She states that she hopes to complete it by the end of the month. She reports that her PCP has been refilling all of her medications. She denies any urgent cardiac needs. Explained that once Medicaid is in place, transportation via ambulance will be an option. Pt expressed understanding and agreed to call back in two weeks to provide an update. Direct number provided.

## 2023-12-26 NOTE — Telephone Encounter (Signed)
 Left HIPAA-compliant VM for [facility] with [facility] requesting a call back. Direct number provided.  [facility] provider did not order home ventilator. This was ordered by Dr. Ariel Begun per 10/01/23 hospital discharge summary. Order to discontinue will likely need to come from PCP.

## 2023-12-27 ENCOUNTER — Telehealth: Payer: Self-pay

## 2023-12-27 NOTE — Progress Notes (Signed)
 Complex Care Management Note  Care Guide Note 12/27/2023 Name: [name] MRN: [id number] DOB: December 15, 1960  [name] is a 63 y.o. year old female who sees Gwenette Lennox, Shellie Dials, DO for primary care. I reached out to [facility] by phone today to offer complex care management services.  [name] was given information about Complex Care Management services today including:   The Complex Care Management services include support from the care team which includes your Nurse Care Manager, Clinical Social Worker, or Pharmacist.  The Complex Care Management team is here to help remove barriers to the health concerns and goals most important to you. Complex Care Management services are voluntary, and the patient may decline or stop services at any time by request to their care team member.   Complex Care Management Consent Status: Patient agreed to services and verbal consent obtained.   Follow up plan:  Telephone appointment with complex care management team member scheduled for:  01/01/24 at 11:00 a.m.   Encounter Outcome:  Patient Scheduled  [facility]  [place] [place], [facility] Care Management Assistant Direct Dial: [phone]  Fax: [phone]

## 2023-12-27 NOTE — Telephone Encounter (Signed)
 Copied from CRM [id number]. Topic: General - Other >> Dec 27, 2023  3:44 PM [place] A wrote: Reason for CRM: [facility]  Needs DC order to pick up Ventilator- Mandy's contact number [phone]

## 2023-12-27 NOTE — Telephone Encounter (Signed)
**Note De-identified  [place] Obfuscation** Please advise 

## 2023-12-27 NOTE — Telephone Encounter (Signed)
?   Who has a ventilator? Who is picking it up? The patient? I haven't evaluated her for this.

## 2023-12-31 ENCOUNTER — Other Ambulatory Visit (HOSPITAL_COMMUNITY): Payer: Self-pay

## 2023-12-31 ENCOUNTER — Telehealth: Payer: Self-pay

## 2023-12-31 ENCOUNTER — Encounter (HOSPITAL_BASED_OUTPATIENT_CLINIC_OR_DEPARTMENT_OTHER): Payer: Commercial Managed Care - HMO | Admitting: General Surgery

## 2023-12-31 NOTE — Telephone Encounter (Signed)
 done

## 2023-12-31 NOTE — Telephone Encounter (Signed)
 Called Mandy [facility] Needs DC order to pick up Ventilator- Mandy's. Lvm for Mandy to call our office back . Dr.Wendling said he hasn't seen the Pt.

## 2024-01-01 ENCOUNTER — Telehealth: Payer: Self-pay | Admitting: Licensed Clinical Social Worker

## 2024-01-01 ENCOUNTER — Other Ambulatory Visit: Payer: Self-pay | Admitting: Licensed Clinical Social Worker

## 2024-01-01 NOTE — Patient Outreach (Signed)
 Complex Care Management   Visit Note  01/01/2024  Name:  [name] MRN: [id number] DOB: [birth year]  Situation: Referral received for Complex Care Management related to Menta/Behavioral Health diagnosis depression  I obtained verbal consent from Patient.  Visit completed with D Riffelon the phone  Background:   Past Medical History:  Diagnosis Date   Aortic stenosis    mild AS by echo 02/2021   Asthma    "on daily RX and rescue inhaler" (04/18/2018)   Chronic diastolic CHF (congestive heart failure) (HCC) 09/2015   Chronic lower back pain    Chronic neck pain    Chronic pain syndrome    Fentanyl  Patch   Colon polyps    Coronary artery disease    cath with normal LM, 30% LAD, 85% mid RCA and 95% distal RCA s/p PCI of the mid to distal RCA and now on DAPT with ASA and Ticagrelor .     Eczema    Excessive daytime sleepiness 11/26/2015   Fibromyalgia    Gallstones    GERD (gastroesophageal reflux disease)    Heart murmur    "noted for the 1st time on 04/18/2018"   History of blood transfusion 07/2010   "S/P oophorectomy"   History of gout    History of hiatal hernia 1980s   "gone now" (04/18/2018)   Hyperlipidemia    Hypertension    takes Metoprolol  and Enalapril  daily   Hypothyroidism    takes Synthroid  daily   IBS (irritable bowel syndrome)    Migraine    "nothing in the 2000s" (04/18/2018)   Mixed connective tissue disease (HCC)    NAFLD (nonalcoholic fatty liver disease)    Pneumonia    "several times" (04/18/2018)   PVC's (premature ventricular contractions)    noted on event monitor 02/2021   Rheumatoid arthritis (HCC)    "hands, elbows, shoulders, probably knees" (04/18/2018)   Scoliosis    Sjogren's syndrome (HCC)    Sleep apnea    mild - does not use cpap   Spondylosis    Type II diabetes mellitus (HCC)    takes Metformin  and Hum R daily (04/18/2018)   Walker as ambulation aid    also uses wheelchair    Assessment: Patient Reported Symptoms:  Cognitive         Neurological      HEENT        Cardiovascular      Respiratory      Endocrine      Gastrointestinal        Genitourinary      Integumentary      Musculoskeletal          Psychosocial Psychosocial Symptoms Reported: Depression - if selected complete PHQ 2-9 Behavioral Health Conditions: Depression Behavioral Management Strategies: Adequate rest, Community resources, Counseling, Coping strategies, Support system   Do you feel physically threatened by others?: No      01/01/2024    3:50 PM  Depression screen PHQ 2/9  Decreased Interest 0  Down, Depressed, Hopeless 0  PHQ - 2 Score 0    There were no vitals filed for this visit.  Medications Reviewed Today   Medications were not reviewed in this encounter     Recommendation:   Pt will contact therapy options provided. Pt will engaged in relaxation techniques to assist with boosting mood.   Follow Up Plan:   Telephone follow-up 01/15/2024  Fletcher Humble MSW, LCSW Licensed Clinical Social Worker  [facility], Population  Health Direct Dial: [phone]  Fax: [phone]

## 2024-01-01 NOTE — Patient Instructions (Signed)
 Visit Information  Thank you for taking time to visit with me today. Please don't hesitate to contact me if I can be of assistance to you before our next scheduled appointment.  Your next care management appointment is by telephone on 01/15/2024 at 2pm  Telephone follow up appointment date/time:  01/15/2024 2 pm  Please call the care guide team at [phone] if you need to cancel, schedule, or reschedule an appointment.   Please call 911 if you are experiencing a Mental Health or Behavioral Health Crisis or need someone to talk to.  Fletcher Humble MSW, LCSW Licensed Clinical Social Worker  [facility], Population Health Direct Dial: [phone]  Fax: [phone]

## 2024-01-01 NOTE — Telephone Encounter (Signed)
 H&V Care Navigation CSW Progress Note  Clinical Social Worker  reached out to [employer] Cain Castillo, [place], and [place], BSW/Care Guide,  to f/u on pt dismissal being on hold pending Medicaid and continued engagement with the home health services. Per LCSW pt is "working on her taxes and will have an update on her Medicaid status soon." LCSW let Cain Castillo and Connye Delaine know that our team is available for collaboration as needed. Will follow as pt was encouraged to call our office back later this month for updates/to see if we are able to get her rescheduled.   Patient is participating in a Managed Medicaid Plan:  No, C[employer] commercial plan only  SDOH Screenings   Food Insecurity: No Food Insecurity (12/10/2023)  Housing: Low Risk  (12/24/2023)  Transportation Needs: Unmet Transportation Needs (12/24/2023)  Utilities: At Risk (10/22/2023)  Alcohol  Screen: Low Risk  (09/10/2023)  Depression (PHQ2-9): Low Risk  (12/24/2023)  Financial Resource Strain: Medium Risk (09/10/2023)  Physical Activity: Unknown (09/10/2023)  Social Connections: Socially Isolated (09/10/2023)  Stress: No Stress Concern Present (09/10/2023)  Tobacco Use: Medium Risk (11/19/2023)     Nathen Balder, MSW, LCSW Clinical Social Worker II [facility] Health Heart/Vascular Care Navigation  [phone]- work cell phone (preferred) [phone]- desk phone

## 2024-01-02 ENCOUNTER — Ambulatory Visit (INDEPENDENT_AMBULATORY_CARE_PROVIDER_SITE_OTHER): Admitting: Pharmacist

## 2024-01-02 ENCOUNTER — Telehealth: Payer: Self-pay | Admitting: Licensed Clinical Social Worker

## 2024-01-02 DIAGNOSIS — G8929 Other chronic pain: Secondary | ICD-10-CM

## 2024-01-02 DIAGNOSIS — E1165 Type 2 diabetes mellitus with hyperglycemia: Secondary | ICD-10-CM

## 2024-01-02 DIAGNOSIS — Z794 Long term (current) use of insulin: Secondary | ICD-10-CM

## 2024-01-02 NOTE — Telephone Encounter (Signed)
 Yes, please see below question that I couldn't find the answer to in the chart notes. Thanks

## 2024-01-02 NOTE — Progress Notes (Signed)
 Heart and Vascular Care Navigation  01/02/2024  [name] 03/08/1961 [id number]  Reason for Referral: community resources to re-engage with [name] Patient is participating in a Managed Medicaid Plan: No, C[employer] commercial plan  Engaged with patient by telephone for initial visit for Heart and Vascular Care Coordination.                                                                                                   Assessment:        LCSW was able to reach pt this morning. Introduced self, role, reason for call. Pt confirmed home address, PCP, and emergency contact. Resides with her husband, is active with VBCI team. She shares that since hospitalization in January she has had difficulty getting out of bed and getting to appts since she cannot easily sit in a wheelchair. She shares they did have APS come out and assess her and her home and she "thinks that's done now." She has [facility] coming into home and one of her home health employees was coming to home when we spoke. She is hopeful that home health can get her stronger to transfer into wheelchair so she can get back into the car and get to appts. At this time she doesn't have Medicaid, is agreeable to referral for review. She otherwise states they have no issues currently with housing, [name], medication access, or utilities. Encouraged her to let us  or VBCI team know if any of these become a hardship and we can assist. She shares that she has had progress with wound healing and is feeling optimistic that between that and her therapy that she will be able to schedule in person visit in the next few weeks. Remain available as needed.    HRT/VAS Care Coordination     Patients Home Cardiology Office [name]   Outpatient Care Team Social Worker   Social Worker Name: [name], [place], [phone]   Living arrangements for the past 2 months Single Family Home   Lives with: Spouse   Patient Has Concern With  Paying Medical Bills No   Does Patient Have Prescription Coverage? Yes   Home Assistive Devices/Equipment CBG Meter; Oxygen ; Wheelchair; Prosthesis; BIPAP   DME Agency [employer]   [name] Agency [facility]   Current home services DME  W/C,rolling walker and BSC       Social History:                                                                             SDOH Screenings   [name] Insecurity: No [name] Insecurity (01/02/2024)  Housing: Low Risk  (01/02/2024)  Transportation Needs: Unmet Transportation Needs (01/02/2024)  Utilities: Not At Risk (01/02/2024)  Recent Concern: Utilities - At Risk (10/22/2023)  Alcohol  Screen: Low Risk  (09/10/2023)  Depression (PHQ2-9): Low Risk  (01/01/2024)  Financial Resource Strain: Low Risk  (01/02/2024)  Physical Activity: Unknown (09/10/2023)  Social Connections: Socially Isolated (09/10/2023)  Stress: No Stress Concern Present (09/10/2023)  Tobacco Use: Medium Risk (11/19/2023)  Health Literacy: Adequate Health Literacy (01/02/2024)    SDOH Interventions: Financial Resources:  Financial Strain Interventions: Other (Comment) (referred to [name] caseworker for screening)  [name] Insecurity:  [name] Insecurity Interventions: Intervention Not Indicated  Housing Insecurity:  Housing Interventions: Intervention Not Indicated  Transportation:   Transportation Interventions: SCAT Psychologist, clinical), Patient Resources (Friends/Family) (dependent on being able to transfer to a wheelchair)    Other Care Navigation Interventions:     Provided Pharmacy assistance resources  Pt denies any issues obtaining or affording her medications- currently using [name]  Patient expressed Mental Health concerns No.   Follow-up plan:   LCSW has referred pt to [name], Medicaid caseworker [name] Received the following update:  "Morning!  [name] has an active Family Planning Medicaid case so I've reached out to a supervisor regarding  a referral to the Disability Medicaid review caseworker teams (she doesn't need to reapply).  I'll be in touch as soon as I have a contact name/more info!  Her Medicaid ID # is [id number] M."  I will follow up with pt to ensure she is aware of update once complete.

## 2024-01-02 NOTE — Progress Notes (Signed)
 01/02/2024 Name: [name] MRN: [id number] DOB: [birth year]  Chief Complaint  Patient presents with   Diabetes   Medication Management    [name] Melven Stable [name] is a 63 y.o. year old female who presented for a telephone visit.   They were referred to the pharmacist by their PCP for assistance in managing diabetes and complex medication management.   Subjective:  Care Team: Primary Care Provider: Jobe Mulder, DO ; Next Scheduled Visit: 01/18/2024  Medication Access/Adherence  Current Pharmacy:  [facility] [address] [place] - [address] [address] Phone: [phone] Fax: [phone]   Patient reports affordability concerns with their medications: Yes  - waiting on prior authorization for diclofenac  2% solution.  Patient reports access/transportation concerns to their pharmacy: No  - patient's husband helps with transportation Patient reports adherence concerns with their medications:  Yes  - varies insulin  dose but she feels like the Humulin  U500 insulin  is not working the same as in the past.    Diabetes:  Current medications:  metformin  ER 500mg  - 2 tablets = 1000mg  twice a day Humalog U500 - uses sliding scale per below per patient Morning and noon:  < 100 none 100 - 150 = 10 units 151 - 200 = 15 units 201 - 250 = 20 units 251 - 300 = 25 units 301 - 350 = 30 units 351 - 400 = 35 units > 400 - calls office  At night:  < 100 = none 100 - 200 = 10 units 201 - 300 = 15 units 302 - 400 = 20 units > 400 = 25 units  Medications tried in the past:   Current glucose readings: Using Dex Com 7 Continuous Glucose Monitor system Patient report the following from her DexCom reprot Last 14 days Average Glucose: 188 mg/dL Glucose Management Indicator: 7.8%  Time in Goal:  - Time in range 70-180: 50% - Time very high range: 17% - Time high range: 32% - Time low range: 1% - Time very low range:  0%  Medication Management:  Regarding diclofenac . She does have 1% gel and 2% solution at home. Patient reports that 1% gel is not as effective for pain relief as 2% solution.  She uses topical diclofenac  on her knee and back for joint pain. 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   Objective:  Lab Results  Component Value Date   HGBA1C 8.0 (H) 09/10/2023    Lab Results  Component Value Date   CREATININE 1.09 (H) 10/01/2023   BUN 24 (H) 10/01/2023   NA 137 10/01/2023   K 3.5 10/01/2023   CL 92 (L) 10/01/2023   CO2 29 10/01/2023    Lab Results  Component Value Date   CHOL 147 05/08/2023   HDL 39.10 05/08/2023   LDLCALC 79 05/08/2023   LDLDIRECT 121.0 10/27/2020   TRIG 142.0 05/08/2023   CHOLHDL 4 05/08/2023   Lab Results  Component Value Date   WBC 7.6 10/01/2023   HGB 11.8 (L) 10/01/2023   HCT 44.2 10/01/2023   MCV 79.2 (L) 10/01/2023   PLT 349 10/01/2023    Medications Reviewed Today     Reviewed by Cecilie Coffee, RPH-CPP (Pharmacist) on 01/02/24 at 1621  Med List Status: <None>   Medication Order Taking? Sig Documenting Provider Last Dose Status Informant  acetaminophen  (TYLENOL ) 500 MG tablet [id number] Yes Take 1,000 mg by mouth as needed for mild pain (pain score 1-3). [provider] Taking Active Self, Pharmacy Records  albuterol  (PROVENTIL ) (2.5 MG/3ML) 0.083% nebulizer solution [id number] Yes USE 1 VIAL IN NEBULIZER EVERY 4 HOURS AS NEEDED FOR WHEEZING FOR SHORTNESS OF BREATH Wendling, Shellie Dials, DO Taking Active Self, Pharmacy Records  albuterol  (VENTOLIN  HFA) 108 [phone] Base) MCG/ACT inhaler [id number] Yes INHALE 2 PUFFS BY MOUTH EVERY 6 HOURS AS NEEDED FOR WHEEZING FOR SHORTNESS OF BREATH  Patient taking differently: Inhale 2 puffs into the lungs every 4 (four) hours as needed for wheezing or shortness of breath.   Jobe Mulder, DO Taking Active Self, Pharmacy Records  augmented betamethasone  dipropionate (DIPROLENE -AF) 0.05 % cream [id number]  Yes APPLY 1 APPLICATION TOPICALLY TWICE DAILY AS NEEDED (RASH) Jobe Mulder, DO Taking Active   Continuous Glucose Sensor (DEXCOM G7 SENSOR) MISC [id number] Yes USE TO CHECK FOR BLOOD SUGAR CONTINUOUSLY- CHANGE SENSOR EVERY 10 DAYS Wendling, Shellie Dials, DO Taking Active Self, Pharmacy Records  diclofenac  Sodium (PENNSAID ) 2 % SOLN [id number] Yes Apply 2 Pump (40 mg total) topically 2 (two) times daily as needed. 1 application to back twice daily Jobe Mulder, DO Taking Active   diltiazem  (CARDIZEM  CD) 360 MG 24 hr capsule [id number]  Take 1 capsule (360 mg total) by mouth daily. Jobe Mulder, DO  Active   diphenhydrAMINE  (BENADRYL ) 25 mg capsule [id number] Yes Take 50 mg by mouth as needed (for allergic reaction prior to Epi Pen). [provider]  Active   ELIQUIS  5 MG TABS tablet [id number] Yes Take 1 tablet by mouth twice daily Jobe Mulder, DO Taking Active   EPINEPHrine  0.3 mg/0.3 mL IJ SOAJ injection [id number]  Inject 0.3 mg into the muscle as needed for anaphylaxis. Jobe Mulder, DO  Active Self, Pharmacy Records           Med Note Randye Buttner   Mon Oct 08, 2023 12:03 PM) PRN for Anaphylaxis/allergic reaction  ferrous sulfate  325 (65 FE) MG tablet [id number] Yes Take 1 tablet (325 mg total) by mouth 2 (two) times daily with a meal. Jobe Mulder, DO Taking Active   fluticasone  (FLONASE ) 50 MCG/ACT nasal spray [id number] Yes USE 2 SPRAY(S) IN EACH NOSTRIL ONCE DAILY AS NEEDED FOR ALLERGIES OR  RHINITIS Jobe Mulder, DO Taking Active   furosemide  (LASIX ) 40 MG tablet [id number] Yes Take 1 tablet (40 mg total) by mouth daily.  Patient taking differently: Take 40 mg by mouth daily. Takes extra 0.5 tablet = 20mg  at noon if needed for swelling / edema   Jobe Mulder, DO Taking Active   gabapentin  (NEURONTIN ) 400 MG capsule [id number] Yes Take 1 capsule (400 mg total) by mouth 4 (four) times daily. TAKE 1  CAPSULE BY MOUTH IN THE MORNING AND 1 AT NOON AND 2 AT BEDTIME Strength: 400 mg Raulkar, Keven Pel, MD Taking Active Self, Pharmacy Records           Med Note [facility]   Wed Jan 02, 2024  4:01 PM)    gentamicin ointment (GARAMYCIN) 0.1 % [id number] Yes Apply 1 Application topically in the morning and at bedtime. [provider] Taking Active Self, Pharmacy Records  Glucagon , rDNA, (GLUCAGON  EMERGENCY) 1 MG KIT [id number]  Inject 1 mg into the vein daily as needed. Jobe Mulder, DO  Active   HUMULIN  R 500 UNIT/ML injection [id number] Yes INJECT 0.08-0.3 MLS (40-150 UNITS) INTO THE SKIN THREE TIMES DAILY WITH MEALS  Patient taking differently: Inject 5-15 Units into the skin in the  morning, at noon, and at bedtime. Takes 5 units three times a day with meals.  Uses SSI on top of the scheduled insulin  with 3-10 units depending on blood sugar level   Wendling, Shellie Dials, DO Taking Active Self, Pharmacy Records  Insulin  Syringes, Disposable, U-100 0.5 ML MISC [id number] Yes 100 each by Does not apply route 4 (four) times daily -  before meals and at bedtime. Maggie Schooner, MD Taking Active   isosorbide  mononitrate (IMDUR ) 60 MG 24 hr tablet [id number] Yes Take 1 tablet (60 mg total) by mouth daily. Swinyer, Leilani Punter, NP Taking Active Self, Pharmacy Records  levocetirizine (XYZAL ) 5 MG tablet [id number] Yes Take 1 tablet (5 mg total) by mouth every evening. Jobe Mulder, DO Taking Active   levothyroxine  (SYNTHROID ) 200 MCG tablet [id number] Yes TAKE 1 TABLET BY MOUTH ONCE DAILY BEFORE BREAKFAST Jobe Mulder, DO Taking Active   lidocaine  (LIDODERM ) 5 % [id number] Yes Place 1 patch onto the skin every 12 (twelve) hours. Remove & Discard patch within 12 hours or as directed  Patient taking differently: Place 1 patch onto the skin daily as needed (pain). Remove & Discard patch within 12 hours or as directed   Jodi Munroe, NP Taking Active Self, Pharmacy  Records  lidocaine  (XYLOCAINE ) 5 % ointment [id number] Yes Apply 1 Application topically in the morning and at bedtime. [provider] Taking Active Self, Pharmacy Records  metFORMIN  (GLUCOPHAGE -XR) 500 MG 24 hr tablet [id number] Yes Take 2 tablets by mouth twice daily Wendling, Shellie Dials, DO Taking Active   Multiple Vitamins-Minerals (WOMENS 50+ MULTI VITAMIN PO) [id number] Yes Take 1 tablet by mouth in the morning and at bedtime. [provider] Taking Active Self, Pharmacy Records  mupirocin  ointment (BACTROBAN ) 2 % [id number] Yes Apply topically 2 (two) times daily. Jobe Mulder, DO Taking Active   naloxone  (NARCAN ) nasal spray 4 mg/0.1 mL [id number] No Place 1 spray into the nose as needed (opoid overdose).  Patient not taking: Reported on 01/02/2024   Jobe Mulder, DO Not Taking Active   nitroGLYCERIN  (NITROSTAT ) 0.4 MG SL tablet [id number]  Place 1 tablet (0.4 mg total) under the tongue every 5 (five) minutes as needed for chest pain. Jobe Mulder, DO  Active   nystatin  (MYCOSTATIN /NYSTOP ) powder [id number] Yes Apply 1 Application topically as needed. [provider] Taking Active   omeprazole  (PRILOSEC) 40 MG capsule [id number] Yes Take 1 capsule by mouth once daily Jobe Mulder, DO Taking Active   OneTouch Delica Lancets 33G MISC [id number] Yes Use to check blood glucose up to 4 times a day Jobe Mulder, DO Taking Active Self, Pharmacy Records  [facility] VERIO test strip [id number] Yes Use as instructed Jobe Mulder, DO Taking Active Self, Pharmacy Records  Oxycodone  HCl 20 MG TABS [id number] Yes Take 1 tablet (20 mg total) by mouth in the morning, at noon, and at bedtime. Jobe Mulder, DO Taking Active   Polyethyl Glyc-Propyl Glyc PF (SYSTANE PRESERVATIVE FREE) 0.4-0.3 % SOLN [id number]  Place 2 drops into both eyes in the morning and at bedtime. [provider]  Active Self, Pharmacy  Records  pramipexole  (MIRAPEX ) 1 MG tablet [id number] Yes TAKE 1 TABLET BY MOUTH THREE TIMES DAILY Jobe Mulder, DO Taking Active   pregabalin  (LYRICA ) 50 MG capsule [id number] Yes TAKE 1 CAPSULE BY MOUTH THREE TIMES DAILY Raulkar, Keven Pel, MD Taking Active   promethazine  (PHENERGAN ) 25 MG tablet [id number] Yes Take 1 tablet (25  mg total) by mouth every 8 (eight) hours as needed for vomiting or nausea.  Patient taking differently: Take 25 mg by mouth as needed for vomiting or nausea.   Jobe Mulder, DO Taking Active Self, Pharmacy Records  spironolactone  (ALDACTONE ) 50 MG tablet [id number] Yes Take 1 tablet by mouth once daily Jobe Mulder, DO Taking Active   Vitamin D , Ergocalciferol , (DRISDOL ) 1.25 MG (50000 UNIT) CAPS capsule [id number] Yes Take 1 capsule by mouth once a week Jobe Mulder, DO Taking Active               Assessment/Plan:   Diabetes:Currently uncontrolled per last A1c from office but per GMI of 7.8% over the last 14 days blood glucose improved  - Reviewed goal A1c, goal fasting, and goal 2 hour post prandial glucose - Tried to connect patient with practice thru Dex Com Clarity. Patient was not able to navigate this over the phone. Patient was provided with office account number to continue to try to connect ([phone]) - Recommend to Continue Insulin  R U500 at current sliding scale since per GMI blood glucose control is improving. Stressed to patient that once we get report from [facility] it will be easier to see where insulin  adjustment are needed.  - GLP1 could be an option but with her history of nausea I am not sure that there could not be possible element of gastroparesis which a GLP1 might exacerbated.   Medication Management: - Checking on prior authorization for diclofenac  2% solution. Sent message to PCP regarding either continuing with prior authorization for diclofenac  2% or trial of diclofenac  1.3% patches.    Follow Up Plan: 2 to 4 weeks  Cecilie Coffee, PharmD Clinical Pharmacist [facility]

## 2024-01-03 ENCOUNTER — Other Ambulatory Visit (HOSPITAL_COMMUNITY): Payer: Self-pay

## 2024-01-09 ENCOUNTER — Other Ambulatory Visit (HOSPITAL_COMMUNITY): Payer: Self-pay

## 2024-01-09 ENCOUNTER — Telehealth: Payer: Self-pay

## 2024-01-10 NOTE — Telephone Encounter (Signed)
 Spoke with [name] to get clarification on diclofenac  2% solution and past diclofenac  products tried.  She currently has both diclofenac  2% solution and diclofenac  1% gel at home. She reports she uses these on her knee and back as needed for joint pain. Patient has a right BKA.   Alternative to diclifenac 2% solution recommended are:  Diclofenac  1% gel - patient is currently using and has used off and on in the past but states it is not as effective as the 2% solution.   Diclofenac  1.3% patches were also recommended by her insurance as an alternative. Indication is usually for acute pain relief. Directions: apply 1 patch to area of pain for 12 hours, remove and apply new patch as needed up to every 12 hours.   Will check with PCP about either trying diclofenac  1.3% patches (might still need prior authorization) or we can try to get prior authorization for the diclofenac  2% solution with current information.

## 2024-01-10 NOTE — Telephone Encounter (Signed)
 OK to try 1.3% patches first. Thx.

## 2024-01-11 ENCOUNTER — Telehealth: Payer: Self-pay

## 2024-01-11 ENCOUNTER — Other Ambulatory Visit (HOSPITAL_COMMUNITY): Payer: Self-pay

## 2024-01-11 ENCOUNTER — Other Ambulatory Visit: Payer: Self-pay

## 2024-01-11 DIAGNOSIS — G8929 Other chronic pain: Secondary | ICD-10-CM

## 2024-01-11 MED ORDER — DICLOFENAC EPOLAMINE 1.3 % EX PTCH: 1.0000 | MEDICATED_PATCH | Freq: Two times a day (BID) | CUTANEOUS | Status: DC

## 2024-01-11 MED ORDER — DICLOFENAC EPOLAMINE 1.3 % EX PTCH: 1.0000 | MEDICATED_PATCH | Freq: Two times a day (BID) | CUTANEOUS | 0 refills | Status: DC

## 2024-01-11 NOTE — Addendum Note (Signed)
 Addended by: Cecilie Coffee B on: 01/11/2024 10:09 AM   Modules accepted: Orders

## 2024-01-11 NOTE — Progress Notes (Signed)
 Rx for diclofenac  1.3% patches every 12 hours as needed was sent to [facility]. I looks like even though it was notes in denial of diclofenac  2% solution that the patches are preferred, they might still need prior authorization. Forwarding to prior authorization team to look out to prior authorization.

## 2024-01-11 NOTE — Progress Notes (Signed)
 Complex Care Management Care Guide Note  01/11/2024 Name: [name] MRN: [id number] DOB: [birth year]  [name] is a 63 y.o. year old female who is a primary care patient of Jobe Mulder, DO and is actively engaged with the care management team. I reached out to [facility] Mckimmy by phone today to assist with re-scheduling  with the RN Case Manager.  Follow up plan: Telephone appointment with complex care management team member scheduled for:  01/23/24 at 11:30 a.m.   [facility]  [facility], [facility] Health Care Management Assistant Direct Dial: [phone]  Fax: [phone]

## 2024-01-11 NOTE — Telephone Encounter (Signed)
 Diclofenac  1.3% has been sent.

## 2024-01-14 ENCOUNTER — Telehealth: Payer: Self-pay

## 2024-01-14 NOTE — Telephone Encounter (Signed)
 Pharmacy Patient Advocate Encounter   Received notification from Onbase that prior authorization for Diclofenac  Epolamine 1.3% patches is required/requested.   Insurance verification completed.   The patient is insured through [employer] .   Per test claim: PA required; PA submitted to above mentioned insurance via CoverMyMeds Key/confirmation #/EOC [id number] Status is pending

## 2024-01-15 ENCOUNTER — Other Ambulatory Visit: Payer: Self-pay | Admitting: Licensed Clinical Social Worker

## 2024-01-15 ENCOUNTER — Other Ambulatory Visit: Payer: Self-pay | Admitting: Family Medicine

## 2024-01-15 DIAGNOSIS — J45909 Unspecified asthma, uncomplicated: Secondary | ICD-10-CM

## 2024-01-15 NOTE — Patient Instructions (Signed)
 Visit Information  Thank you for taking time to visit with me today. Please don't hesitate to contact me if I can be of assistance to you before our next scheduled appointment.  Your next care management appointment is by telephone on 01/29/2024 at 2pm  Telephone follow up appointment date/time:  01/29/2024 2pm  Please call the care guide team at [phone] if you need to cancel, schedule, or reschedule an appointment.   Please call 911 if you are experiencing a Mental Health or Behavioral Health Crisis or need someone to talk to.  Fletcher Humble MSW, LCSW Licensed Clinical Social Worker  [facility], Population Health Direct Dial: [phone]  Fax: [phone]

## 2024-01-15 NOTE — Patient Outreach (Signed)
 Complex Care Management   Visit Note  01/15/2024  Name:  [name] MRN: [id number] DOB: 1960-09-16  Situation: Referral received for Complex Care Management related to [facility]/Behavioral Health diagnosis depression  I obtained verbal consent from Patient.  Visit completed with D Blitzer  on the phone  Background:   Past Medical History:  Diagnosis Date   Aortic stenosis    mild AS by echo 02/2021   Asthma    "on daily RX and rescue inhaler" (04/18/2018)   Chronic diastolic CHF (congestive heart failure) (HCC) 09/2015   Chronic lower back pain    Chronic neck pain    Chronic pain syndrome    Fentanyl  Patch   Colon polyps    Coronary artery disease    cath with normal LM, 30% LAD, 85% mid RCA and 95% distal RCA s/p PCI of the mid to distal RCA and now on DAPT with ASA and Ticagrelor .     Eczema    Excessive daytime sleepiness 11/26/2015   Fibromyalgia    Gallstones    GERD (gastroesophageal reflux disease)    Heart murmur    "noted for the 1st time on 04/18/2018"   History of blood transfusion 07/2010   "S/P oophorectomy"   History of gout    History of hiatal hernia 1980s   "gone now" (04/18/2018)   Hyperlipidemia    Hypertension    takes Metoprolol  and Enalapril  daily   Hypothyroidism    takes Synthroid  daily   IBS (irritable bowel syndrome)    Migraine    "nothing in the 2000s" (04/18/2018)   Mixed connective tissue disease (HCC)    NAFLD (nonalcoholic fatty liver disease)    Pneumonia    "several times" (04/18/2018)   PVC's (premature ventricular contractions)    noted on event monitor 02/2021   Rheumatoid arthritis (HCC)    "hands, elbows, shoulders, probably knees" (04/18/2018)   Scoliosis    Sjogren's syndrome (HCC)    Sleep apnea    mild - does not use cpap   Spondylosis    Type II diabetes mellitus (HCC)    takes Metformin  and Hum R daily (04/18/2018)   Walker as ambulation aid    also uses wheelchair    Assessment: Patient Reported Symptoms:  Cognitive         Neurological      HEENT        Cardiovascular      Respiratory      Endocrine      Gastrointestinal        Genitourinary      Integumentary      Musculoskeletal          Psychosocial       Do you feel physically threatened by others?: No      01/01/2024    3:50 PM  Depression screen PHQ 2/9  Decreased Interest 0  Down, Depressed, Hopeless 0  PHQ - 2 Score 0    There were no vitals filed for this visit.  Medications Reviewed Today     Reviewed by Fletcher Humble, LCSW (Social Worker) on 01/15/24 at 1401  Med List Status: <None>   Medication Order Taking? Sig Documenting Provider Last Dose Status Informant  acetaminophen  (TYLENOL ) 500 MG tablet [id number] No Take 1,000 mg by mouth as needed for mild pain (pain score 1-3). [provider] Taking Active Self, Pharmacy Records  albuterol  (PROVENTIL ) (2.5 MG/3ML) 0.083% nebulizer solution [id number] No USE 1 VIAL IN NEBULIZER EVERY 4 HOURS  AS NEEDED FOR WHEEZING FOR SHORTNESS OF BREATH Wendling, Shellie Dials, DO Taking Active Self, Pharmacy Records  albuterol  (VENTOLIN  HFA) 108 [id number] Base) MCG/ACT inhaler [id number] No INHALE 2 PUFFS BY MOUTH EVERY 6 HOURS AS NEEDED FOR WHEEZING FOR SHORTNESS OF BREATH  Patient taking differently: Inhale 2 puffs into the lungs every 4 (four) hours as needed for wheezing or shortness of breath.   Jobe Mulder, DO Taking Active Self, Pharmacy Records  augmented betamethasone  dipropionate (DIPROLENE -AF) 0.05 % cream [id number] No APPLY 1 APPLICATION TOPICALLY TWICE DAILY AS NEEDED (RASH) Jobe Mulder, DO Taking Active   Continuous Glucose Sensor (DEXCOM G7 SENSOR) MISC [id number] No USE TO CHECK FOR BLOOD SUGAR CONTINUOUSLY- CHANGE SENSOR EVERY 10 DAYS Wendling, Shellie Dials, DO Taking Active Self, Pharmacy Records  diclofenac  [facility]) 1.3 % 1 patch [id number]   Jobe Mulder, DO  Active   diclofenac  (FLECTOR) 1.3 % [facility] [id number]  Place 1 patch onto  the skin 2 (two) times daily. Wear patch up to 12 hours then remove and replace with new patch as needed. Jobe Mulder, DO  Active   diclofenac  Sodium (PENNSAID ) 2 % SOLN [id number] No Apply 2 Pump (40 mg total) topically 2 (two) times daily as needed. 1 application to back twice daily Jobe Mulder, DO Taking Active   diltiazem  (CARDIZEM  CD) 360 MG 24 hr capsule [id number] No Take 1 capsule (360 mg total) by mouth daily. Jobe Mulder, DO Taking Active   diphenhydrAMINE  (BENADRYL ) 25 mg capsule [id number]  Take 50 mg by mouth as needed (for allergic reaction prior to Epi Pen). [provider]  Active   ELIQUIS  5 MG TABS tablet [id number] No Take 1 tablet by mouth twice daily Jobe Mulder, DO Taking Active   EPINEPHrine  0.3 mg/0.3 mL IJ SOAJ injection [id number] No Inject 0.3 mg into the muscle as needed for anaphylaxis. Jobe Mulder, DO Taking Active Self, Pharmacy Records           Med Note Randye Buttner   Mon Oct 08, 2023 12:03 PM) PRN for Anaphylaxis/allergic reaction  ferrous sulfate  325 (65 FE) MG tablet [id number] No Take 1 tablet (325 mg total) by mouth 2 (two) times daily with a meal. Jobe Mulder, DO Taking Active   fluticasone  (FLONASE ) 50 MCG/ACT nasal spray [id number] No USE 2 SPRAY(S) IN EACH NOSTRIL ONCE DAILY AS NEEDED FOR ALLERGIES OR  RHINITIS Jobe Mulder, DO Taking Active   furosemide  (LASIX ) 40 MG tablet [id number] No Take 1 tablet (40 mg total) by mouth daily.  Patient taking differently: Take 40 mg by mouth daily. Takes extra 0.5 tablet = 20mg  at noon if needed for swelling / edema   Jobe Mulder, DO Taking Active   gabapentin  (NEURONTIN ) 400 MG capsule [id number] No Take 1 capsule (400 mg total) by mouth 4 (four) times daily. TAKE 1 CAPSULE BY MOUTH IN THE MORNING AND 1 AT NOON AND 2 AT BEDTIME Strength: 400 mg Raulkar, Keven Pel, MD Taking Active Self, Pharmacy Records           Med Note  [facility]   Wed Jan 02, 2024  4:01 PM)    gentamicin ointment (GARAMYCIN) 0.1 % [id number] No Apply 1 Application topically in the morning and at bedtime. [provider] Taking Active Self, Pharmacy Records  Glucagon , rDNA, (GLUCAGON  EMERGENCY) 1 MG KIT [id number] No Inject 1 mg into the vein daily as needed. Jobe Mulder, DO Taking Active  HUMULIN  R 500 UNIT/ML injection [id number] No INJECT 0.08-0.3 MLS (40-150 UNITS) INTO THE SKIN THREE TIMES DAILY WITH MEALS  Patient taking differently: Inject 5-15 Units into the skin in the morning, at noon, and at bedtime. Takes 5 units three times a day with meals.  Uses SSI on top of the scheduled insulin  with 3-10 units depending on blood sugar level   Wendling, Shellie Dials, DO Taking Active Self, Pharmacy Records  Insulin  Syringes, Disposable, U-100 0.5 ML MISC [id number] No 100 each by Does not apply route 4 (four) times daily -  before meals and at bedtime. Maggie Schooner, MD Taking Active   isosorbide  mononitrate (IMDUR ) 60 MG 24 hr tablet [id number] No Take 1 tablet (60 mg total) by mouth daily. Swinyer, Leilani Punter, NP Taking Active Self, Pharmacy Records  levocetirizine (XYZAL ) 5 MG tablet [id number] No Take 1 tablet (5 mg total) by mouth every evening. Jobe Mulder, DO Taking Active   levothyroxine  (SYNTHROID ) 200 MCG tablet [id number] No TAKE 1 TABLET BY MOUTH ONCE DAILY BEFORE BREAKFAST Jobe Mulder, DO Taking Active   lidocaine  (LIDODERM ) 5 % [id number] No Place 1 patch onto the skin every 12 (twelve) hours. Remove & Discard patch within 12 hours or as directed  Patient taking differently: Place 1 patch onto the skin daily as needed (pain). Remove & Discard patch within 12 hours or as directed   Jodi Munroe, NP Taking Active Self, Pharmacy Records  lidocaine  (XYLOCAINE ) 5 % ointment [id number] No Apply 1 Application topically in the morning and at bedtime. [provider] Taking Active Self,  Pharmacy Records  metFORMIN  (GLUCOPHAGE -XR) 500 MG 24 hr tablet [id number] No Take 2 tablets by mouth twice daily Wendling, Shellie Dials, DO Taking Active   Multiple Vitamins-Minerals (WOMENS 50+ MULTI VITAMIN PO) [id number] No Take 1 tablet by mouth in the morning and at bedtime. [provider] Taking Active Self, Pharmacy Records  mupirocin  ointment (BACTROBAN ) 2 % [id number] No Apply topically 2 (two) times daily. Jobe Mulder, DO Taking Active   naloxone  (NARCAN ) nasal spray 4 mg/0.1 mL [id number] No Place 1 spray into the nose as needed (opoid overdose).  Patient not taking: Reported on 01/02/2024   Jobe Mulder, DO Not Taking Active   nitroGLYCERIN  (NITROSTAT ) 0.4 MG SL tablet [id number] No Place 1 tablet (0.4 mg total) under the tongue every 5 (five) minutes as needed for chest pain. Jobe Mulder, DO Taking Active   nystatin  (MYCOSTATIN /NYSTOP ) powder [id number] No Apply 1 Application topically as needed. [provider] Taking Active   omeprazole  (PRILOSEC) 40 MG capsule [id number] No Take 1 capsule by mouth once daily Jobe Mulder, DO Taking Active   OneTouch Delica Lancets 33G MISC [id number] No Use to check blood glucose up to 4 times a day Jobe Mulder, DO Taking Active Self, Pharmacy Records  [facility] VERIO test strip [id number] No Use as instructed Jobe Mulder, DO Taking Active Self, Pharmacy Records  Oxycodone  HCl 20 MG TABS [id number] No Take 1 tablet (20 mg total) by mouth in the morning, at noon, and at bedtime. Jobe Mulder, DO Taking Active   Polyethyl Glyc-Propyl Glyc PF (SYSTANE PRESERVATIVE FREE) 0.4-0.3 % SOLN [id number] No Place 2 drops into both eyes in the morning and at bedtime. [provider] Taking Active Self, Pharmacy Records  pramipexole  (MIRAPEX ) 1 MG tablet [id number] No TAKE 1 TABLET BY MOUTH THREE TIMES DAILY Wendling, Shellie Dials, DO Taking Active   pregabalin  (LYRICA )  50 MG capsule [id number] No TAKE 1 CAPSULE BY MOUTH THREE TIMES DAILY Raulkar, Keven Pel, MD Taking Active   promethazine  (PHENERGAN ) 25 MG tablet [id number] No Take 1 tablet (25 mg total) by mouth every 8 (eight) hours as needed for vomiting or nausea.  Patient taking differently: Take 25 mg by mouth as needed for vomiting or nausea.   Jobe Mulder, DO Taking Active Self, Pharmacy Records  spironolactone  (ALDACTONE ) 50 MG tablet [id number] No Take 1 tablet by mouth once daily Jobe Mulder, DO Taking Active   Vitamin D , Ergocalciferol , (DRISDOL ) 1.25 MG (50000 UNIT) CAPS capsule [id number] No Take 1 capsule by mouth once a week Jobe Mulder, DO Taking Active             Recommendation:   Start journal writing to identify emotional and behavioral triggers. Continue with self care techniques, prioritize task to prevent burnout   Follow Up Plan:   Telephone follow up appointment date/time:  01/29/2024 at 2pm  Fletcher Humble MSW, LCSW Licensed Clinical Social Worker  [facility], Population Health Direct Dial: [phone]  Fax: [phone]

## 2024-01-16 MED ORDER — HUMULIN R U-500 (CONCENTRATED) 500 UNIT/ML ~~LOC~~ SOLN: SUBCUTANEOUS | 0 refills | Status: DC

## 2024-01-16 MED ORDER — FLUTICASONE PROPIONATE 50 MCG/ACT NA SUSP: 2.0000 | Freq: Every day | NASAL | 0 refills | Status: DC | PRN

## 2024-01-16 MED ORDER — OXYCODONE HCL 20 MG PO TABS: 20.0000 mg | ORAL_TABLET | Freq: Three times a day (TID) | ORAL | 0 refills | Status: DC

## 2024-01-16 MED ORDER — LEVOCETIRIZINE DIHYDROCHLORIDE 5 MG PO TABS: 5.0000 mg | ORAL_TABLET | Freq: Every evening | ORAL | 0 refills | Status: DC

## 2024-01-16 MED ORDER — DIPHENHYDRAMINE HCL 25 MG PO CAPS: 50.0000 mg | ORAL_CAPSULE | ORAL | 2 refills | Status: DC | PRN

## 2024-01-16 MED ORDER — ALBUTEROL SULFATE HFA 108 (90 BASE) MCG/ACT IN AERS: INHALATION_SPRAY | RESPIRATORY_TRACT | 0 refills | Status: DC

## 2024-01-16 NOTE — Telephone Encounter (Signed)
 Got the approval on the diclofenac  1.3% patches!

## 2024-01-16 NOTE — Telephone Encounter (Signed)
 Pharmacy Patient Advocate Encounter  Received notification from [facility] that Prior Authorization for Diclofenac  1.3% patches has been APPROVED from 01/14/2024 to 01/13/2025   PA #/Case ID/Reference #: [id number]

## 2024-01-16 NOTE — Telephone Encounter (Signed)
Message sent to Pt

## 2024-01-17 NOTE — Telephone Encounter (Signed)
 Confirmed cost of diclofenac  patches would be $0 - patient has Rx ready at [facility].  Also walked patient thru connecting her DexCom 7 with our office.  She reports that she feels that blood glucose has been more variable lately. Wonders if the formulation of Humulin  U500 has changed.  I was not able to find that there have been any recent changes to Humulin  U500.  We also discussed other insulin  options which might provide smoother blood glucose results - she is considering trial of Tresiba U200 or Toujeo  Max U 300 insulin .  Will check back with patient in 1 week. After we have a more complete record of her recent blood glucose trends.

## 2024-01-18 ENCOUNTER — Ambulatory Visit: Admitting: Family Medicine

## 2024-01-21 ENCOUNTER — Telehealth: Payer: Self-pay | Admitting: Licensed Clinical Social Worker

## 2024-01-21 NOTE — Telephone Encounter (Addendum)
 H&V Care Navigation CSW Progress Note  Clinical Social Worker contacted patient by phone to f/u on her work with home therapies and referral for Medicaid. Was able to reach her this morning at [phone]. Introduced self, role, reason for call. Confirmed she had been contacted by Medicaid. She was a little confused b/c they mentioned disability and she had started an application in the past but never completed it. Pt inquired about why she would need to be disabled for Medicaid.   I explained that there are multiple Medicaid programs, expansion Medicaid does not require disability just to meet certain income eligibility which at this time her husband's income likely puts them over. Shared that since she she hasn't been employed due to her health for some time the Medicaid teams wanted to make sure she didn't miss out on coverage from other eligibility. Pt understands. Is agreeable to me making a referral for her to be assessed by [facility] to see if they can assist her with disability filing. We discussed further that Medicaid has wheelchair transportation that she would be able to use if she needed to get in and out of house without major car transfers- also disability income may be able to assist with costs of living since just her husband is working. Pt agreeable, emailed her the [facility] ROI form and how to return to this Clinical research associate.  She has an appt scheduled for PCP on 5/23 in person and is hopeful if she can successfully navigate getting to that visit that she will be able to get to [facility]. Encouraged her to keep me in the loop as well. Updated Firman Hughes, RN clinical team lead.  Patient is participating in a Managed Medicaid Plan:  No, [employer] commercial plan only  SDOH Screenings   Food Insecurity: No Food Insecurity (01/02/2024)  Housing: Low Risk  (01/02/2024)  Transportation Needs: Unmet Transportation Needs (01/02/2024)  Utilities: Not At Risk (01/02/2024)  Recent Concern:  Utilities - At Risk (10/22/2023)  Alcohol  Screen: Low Risk  (09/10/2023)  Depression (PHQ2-9): Low Risk  (01/01/2024)  Financial Resource Strain: Low Risk  (01/02/2024)  Physical Activity: Unknown (09/10/2023)  Social Connections: Socially Isolated (09/10/2023)  Stress: No Stress Concern Present (09/10/2023)  Tobacco Use: Medium Risk (11/19/2023)  Health Literacy: Adequate Health Literacy (01/02/2024)   Nathen Balder, MSW, LCSW Clinical Social Worker II [facility] Heart/Vascular Care Navigation  [phone]- work cell phone (preferred)

## 2024-01-22 ENCOUNTER — Telehealth: Payer: Self-pay | Admitting: Family Medicine

## 2024-01-22 NOTE — Telephone Encounter (Signed)
 Copied from CRM [id number]. Topic: Medical Record Request - Provider/Facility Request >> Jan 22, 2024  1:21 PM [place] J wrote: Reason for CRM: Moira [name] calling from [facility] needing a fax resent to office. The fax was a signed order and was scanned into patient's chart on 12/07/2023. Order number for form is [id number].   Please fax to: [phone]

## 2024-01-23 ENCOUNTER — Other Ambulatory Visit: Payer: Self-pay

## 2024-01-23 NOTE — Patient Instructions (Signed)
 Visit Information  Thank you for taking time to visit with me today. Please don't hesitate to contact me if I can be of assistance to you before our next scheduled appointment.  Our next appointment is by telephone on 02/07/24 at 11:00 Please call the care guide team at [phone] if you need to cancel or reschedule your appointment.   Following is a copy of your care plan:   Goals Addressed             This Visit's Progress    VBCI RN Care Plan   No change    Problems:  Chronic Disease Management support and education needs related to CHF, DMII, HTN, and Skin breakdown  Goal: Over the next 3 months the Patient will attend all scheduled medical appointments:  with PCP 02/01/24 and Wound clinic 02/06/24 as evidenced by chart review and patient report        continue to work with RN Care Manager and/or Social Worker to address care management and care coordination needs related to DMII and HTN as evidenced by adherence to CM Team Scheduled appointments     demonstrate Ongoing adherence to prescribed treatment plan for DMII as evidenced by proving fasting and post prandial blood sugar readings keeping with goal range of fasting <130, post prandial <180, and working with [facility] PT to meet goal of improved mobility, able to transfer from bed to chair with assistance.  Interventions:   Diabetes Interventions: Assessed patient's understanding of A1c goal: <6.5% Provided education to patient about basic DM disease process Reviewed medications with patient and discussed importance of medication adherence Counseled on importance of regular laboratory monitoring as prescribed Discussed plans with patient for ongoing care management follow up and provided patient with direct contact information for care management team Advised patient, providing education and rationale, to check cbg using CBG montior and record, calling PCP for findings outside established parameters Encouraged patient to continue  to work with Home Health PT to reach her goal of being able to transfer from bed to wheel chair, which will enable her to return to in person medical visits. Lab Results  Component Value Date   HGBA1C 8.0 (H) 09/10/2023    Patient [name] Activities:  Attend all scheduled provider appointments Call pharmacy for medication refills 3-7 days in advance of running out of medications Call provider office for new concerns or questions  Perform all self care activities independently  Take medications as prescribed   Work with the pharmacist to address medication management needs and will continue to work with the clinical team to address health care and disease management related needs  Plan:  Telephone follow up appointment with care management team member scheduled for:  02/07/24 at 11:00          VBCI Social Work Care Plan   On track    Problems:   Disease Management support and education needs related to Depression: depressed mood  CSW Clinical Goal(s):   Over the next 3 days the Patient will demonstrate a reduction in symptoms related to Depression: depressed mood .  Interventions:  Mental Health:  Evaluation of current treatment plan related to Depression: depressed mood Active listening / Reflection utilized Emotional Support Provided Mindfulness or Relaxation training provided  Patient Goals/[name] Activities:  Increase coping skills  Plan:   Telephone follow up appointment with care management team member scheduled for:  01/29/2024        Please call the Suicide and Crisis Lifeline: 988 call the [place]   National Suicide Prevention Lifeline: 705-720-5858 or TTY: 970-443-3634 TTY 925-451-1771) to talk to a trained counselor call 1-800-273-TALK (toll free, 24 hour hotline) if you are experiencing a Mental Health or Behavioral Health Crisis or need someone to talk to.  Patient verbalizes understanding of instructions and care plan provided today and agrees to  view in MyChart. Active MyChart status and patient understanding of how to access instructions and care plan via MyChart confirmed with patient.      Clarnce Crow BSN RN CCM [place]  [facility], [facility] RN Care Manager Direct Dial: [phone] Fax: [phone]

## 2024-01-23 NOTE — Telephone Encounter (Signed)
Orders has been faxed.

## 2024-01-23 NOTE — Patient Outreach (Signed)
 Complex Care Management   Visit Note  01/23/2024  Name:  [name] MRN: [id number] DOB: [birth year]  Situation: Referral received for Complex Care Management related to Heart Failure, Diabetes with Complications, and Skin Breakdown I obtained verbal consent from Patient.  Visit completed with patient  on the phone  Background:   Past Medical History:  Diagnosis Date   Aortic stenosis    mild AS by echo 02/2021   Asthma    "on daily RX and rescue inhaler" (04/18/2018)   Chronic diastolic CHF (congestive heart failure) (HCC) 09/2015   Chronic lower back pain    Chronic neck pain    Chronic pain syndrome    Fentanyl  Patch   Colon polyps    Coronary artery disease    cath with normal LM, 30% LAD, 85% mid RCA and 95% distal RCA s/p PCI of the mid to distal RCA and now on DAPT with ASA and Ticagrelor .     Eczema    Excessive daytime sleepiness 11/26/2015   Fibromyalgia    Gallstones    GERD (gastroesophageal reflux disease)    Heart murmur    "noted for the 1st time on 04/18/2018"   History of blood transfusion 07/2010   "S/P oophorectomy"   History of gout    History of hiatal hernia 1980s   "gone now" (04/18/2018)   Hyperlipidemia    Hypertension    takes Metoprolol  and Enalapril  daily   Hypothyroidism    takes Synthroid  daily   IBS (irritable bowel syndrome)    Migraine    "nothing in the 2000s" (04/18/2018)   Mixed connective tissue disease (HCC)    NAFLD (nonalcoholic fatty liver disease)    Pneumonia    "several times" (04/18/2018)   PVC's (premature ventricular contractions)    noted on event monitor 02/2021   Rheumatoid arthritis (HCC)    "hands, elbows, shoulders, probably knees" (04/18/2018)   Scoliosis    Sjogren's syndrome (HCC)    Sleep apnea    mild - does not use cpap   Spondylosis    Type II diabetes mellitus (HCC)    takes Metformin  and Hum R daily (04/18/2018)   Walker as ambulation aid    also uses wheelchair    Assessment: Patient Reported  Symptoms:  Cognitive Cognitive Status: Alert and oriented to person, place, and time      Neurological Neurological Review of Symptoms: No symptoms reported    HEENT HEENT Symptoms Reported: No symptoms reported      Cardiovascular Cardiovascular Symptoms Reported: Swelling in legs or feet Does patient have uncontrolled Hypertension?: Yes Is patient checking Blood Pressure at home?: Yes Patient's Recent BP reading at home: 132/68 Cardiovascular Conditions: Heart failure, Hypertension Cardiovascular Management Strategies: Medication therapy, Routine screening, Diet modification, Fluid modification  Respiratory Respiratory Symptoms Reported: No symptoms reported Respiratory Conditions: Seasonal allergies, Sleep disordered breathing (patient unable to use BPAP machine due to claustriphobia,will discuss with PCP at next visit)  Endocrine Patient reports the following symptoms related to hypoglycemia or hyperglycemia : No symptoms reported Is patient diabetic?: Yes Is patient checking blood sugars at home?: Yes Endocrine Conditions: Diabetes Endocrine Management Strategies: Medical device, Medication therapy, Routine screening, Diet modification Endocrine Self-Management Outcome: 3 (uncertain)  Gastrointestinal Gastrointestinal Symptoms Reported: No symptoms reported   Nutrition Risk Screen (CP): Large or nonhealing wound, burn or pressure injury (patient with PU on genitals, HH RN reports not improving as expected.  patient has added protein shakes to daily intake)  Genitourinary Genitourinary  Symptoms Reported: Other Other Genitourinary Symptoms: Pressure ucler on genitals, currently in tresatment with wound care, and weekly [facility] RN visits Genitourinary Self-Management Outcome: 3 (uncertain)  Integumentary Integumentary Symptoms Reported: Wound Additional Integumentary Details: wound to heel has healed, still has PU on toe, HH RN reports worsening PU on genitals. Skin Conditions: Pressure  injury Skin Management Strategies: Dressing changes, Routine screening, Coping strategies, Diet modification, Medication therapy Skin Self-Management Outcome: 3 (uncertain)  Musculoskeletal Musculoskelatal Symptoms Reviewed: Other Other Musculoskeletal Symptoms: patient working with [facility] PT to improve strength to allow for bed/chair transferrs  Patient feels she is getting stronger, able to stand now for 15 seconds, still not able to ambulate. Musculoskeletal Conditions: Mobility limited Musculoskeletal Management Strategies: Exercise, Routine screening, Activity, Coping strategies Musculoskeletal Self-Management Outcome: 3 (uncertain) Falls in the past year?: Yes Number of falls in past year: 2 or more Was there an injury with Fall?: No Fall Risk Category Calculator: 2 Patient Fall Risk Level: Moderate Fall Risk Patient at Risk for Falls Due to: History of fall(s), Impaired balance/gait, Impaired mobility Fall risk Follow up: Education provided, Falls prevention discussed  Psychosocial Psychosocial Symptoms Reported: Not assessed Additional Psychological Details: patient declined additional visits with [facility] LCSW, requested that [facility] cancel scheduled appt. Behavioral Management Strategies: Medication therapy, Coping strategies, Counseling Major Change/Loss/Stressor/Fears (CP): Medical condition, self Techniques to Cope with Loss/Stress/Change: Counseling Quality of Family Relationships: helpful, involved, supportive      01/01/2024    3:50 PM  Depression screen PHQ 2/9  Decreased Interest 0  Down, Depressed, Hopeless 0  PHQ - 2 Score 0    Vitals:   01/23/24 1148  BP: 132/68    Medications Reviewed Today     Reviewed by Clarnce Crow, RN (Registered Nurse) on 01/23/24 at 1157  Med List Status: <None>   Medication Order Taking? Sig Documenting Provider Last Dose Status Informant  acetaminophen  (TYLENOL ) 500 MG tablet [id number] Yes Take 1,000 mg by mouth as needed for mild pain  (pain score 1-3). [provider] Taking Active Self, Pharmacy Records  albuterol  (PROVENTIL ) (2.5 MG/3ML) 0.083% nebulizer solution [id number] Yes USE 1 VIAL IN NEBULIZER EVERY 4 HOURS AS NEEDED FOR WHEEZING FOR SHORTNESS OF BREATH Wendling, Shellie Dials, DO Taking Active Self, Pharmacy Records  albuterol  (VENTOLIN  HFA) [phone] Base) MCG/ACT inhaler [id number] Yes INHALE 2 PUFFS BY MOUTH EVERY 6 HOURS AS NEEDED FOR WHEEZING FOR SHORTNESS OF BREATH Jobe Mulder, DO Taking Active   augmented betamethasone  dipropionate (DIPROLENE -AF) 0.05 % cream [id number] Yes APPLY 1 APPLICATION TOPICALLY TWICE DAILY AS NEEDED (RASH) Jobe Mulder, DO Taking Active   Continuous Glucose Sensor (DEXCOM G7 SENSOR) MISC [id number] Yes USE TO CHECK FOR BLOOD SUGAR CONTINUOUSLY- CHANGE SENSOR EVERY 10 DAYS Wendling, Shellie Dials, DO Taking Active Self, Pharmacy Records  diclofenac  (FLECTOR ) 1.3 % 1 patch [id number]   Jobe Mulder, DO  Active   diclofenac  (FLECTOR ) 1.3 % [facility] [id number] Yes Place 1 patch onto the skin 2 (two) times daily. Wear patch up to 12 hours then remove and replace with new patch as needed. Jobe Mulder, DO Taking Active   diclofenac  Sodium (PENNSAID ) 2 % SOLN [id number] Yes Apply 2 Pump (40 mg total) topically 2 (two) times daily as needed. 1 application to back twice daily Jobe Mulder, DO Taking Active   diltiazem  (CARDIZEM  CD) 360 MG 24 hr capsule [id number] Yes Take 1 capsule (360 mg total) by mouth daily. Jobe Mulder, DO Taking Active   diphenhydrAMINE  (BENADRYL ) 25  mg capsule [id number] Yes Take 2 capsules (50 mg total) by mouth as needed (for allergic reaction prior to Epi Pen). Jobe Mulder, DO Taking Active   ELIQUIS  5 MG TABS tablet [id number] Yes Take 1 tablet by mouth twice daily Jobe Mulder, DO Taking Active   EPINEPHrine  0.3 mg/0.3 mL IJ SOAJ injection [id number] Yes Inject 0.3 mg into the muscle as  needed for anaphylaxis. Jobe Mulder, DO Taking Active Self, Pharmacy Records           Med Note Randye Buttner   Mon Oct 08, 2023 12:03 PM) PRN for Anaphylaxis/allergic reaction  ferrous sulfate  325 (65 FE) MG tablet [id number] Yes Take 1 tablet (325 mg total) by mouth 2 (two) times daily with a meal. Jobe Mulder, DO Taking Active   fluticasone  (FLONASE ) 50 MCG/ACT nasal spray [id number] Yes Place 2 sprays into both nostrils daily as needed for allergies or rhinitis. Jobe Mulder, DO Taking Active   furosemide  (LASIX ) 40 MG tablet [id number] Yes Take 1 tablet (40 mg total) by mouth daily.  Patient taking differently: Take 40 mg by mouth daily. Takes extra 0.5 tablet = 20mg  at noon if needed for swelling / edema   Jobe Mulder, DO Taking Active   gabapentin  (NEURONTIN ) 400 MG capsule [id number] Yes Take 1 capsule (400 mg total) by mouth 4 (four) times daily. TAKE 1 CAPSULE BY MOUTH IN THE MORNING AND 1 AT NOON AND 2 AT BEDTIME Strength: 400 mg Raulkar, Keven Pel, MD Taking Active Self, Pharmacy Records           Med Note [facility], [facility]   Wed Jan 02, 2024  4:01 PM)    gentamicin ointment (GARAMYCIN) 0.1 % [id number] Yes Apply 1 Application topically in the morning and at bedtime. [provider] Taking Active Self, Pharmacy Records  Glucagon , rDNA, (GLUCAGON  EMERGENCY) 1 MG KIT [id number] Yes Inject 1 mg into the vein daily as needed. Jobe Mulder, DO Taking Active   insulin  regular human CONCENTRATED (HUMULIN  R) 500 UNIT/ML injection [id number] Yes INJECT 0.08-0.3 MLS (40-150 UNITS) INTO THE SKIN THREE TIMES DAILY WITH MEALS Gwenette Lennox, Shellie Dials, DO Taking Active   Insulin  Syringes, Disposable, U-100 0.5 ML MISC [id number] Yes 100 each by Does not apply route 4 (four) times daily -  before meals and at bedtime. Maggie Schooner, MD Taking Active   isosorbide  mononitrate (IMDUR ) 60 MG 24 hr tablet [id number] Yes Take 1 tablet (60 mg total) by  mouth daily. Swinyer, Leilani Punter, NP Taking Active Self, Pharmacy Records  levocetirizine (XYZAL ) 5 MG tablet [id number] Yes Take 1 tablet (5 mg total) by mouth every evening. Jobe Mulder, DO Taking Active   levothyroxine  (SYNTHROID ) 200 MCG tablet [id number] Yes TAKE 1 TABLET BY MOUTH ONCE DAILY BEFORE BREAKFAST Jobe Mulder, DO Taking Active   lidocaine  (LIDODERM ) 5 % [id number]  Place 1 patch onto the skin every 12 (twelve) hours. Remove & Discard patch within 12 hours or as directed  Patient taking differently: Place 1 patch onto the skin daily as needed (pain). Remove & Discard patch within 12 hours or as directed   Jodi Munroe, NP  Active Self, Pharmacy Records  lidocaine  (XYLOCAINE ) 5 % ointment [id number]  Apply 1 Application topically in the morning and at bedtime. [provider]  Active Self, Pharmacy Records  metFORMIN  (GLUCOPHAGE -XR) 500 MG 24 hr tablet [id number] Yes Take 2 tablets by mouth twice daily Wendling, Shellie Dials, DO Taking Active  Multiple Vitamins-Minerals (WOMENS 50+ MULTI VITAMIN PO) [id number] Yes Take 1 tablet by mouth in the morning and at bedtime. [provider] Taking Active Self, Pharmacy Records  mupirocin  ointment (BACTROBAN ) 2 % [id number] Yes Apply topically 2 (two) times daily. Jobe Mulder, DO Taking Active   naloxone  (NARCAN ) nasal spray 4 mg/0.1 mL [id number] Yes Place 1 spray into the nose as needed (opoid overdose). Jobe Mulder, DO Taking Active   nitroGLYCERIN  (NITROSTAT ) 0.4 MG SL tablet [id number] Yes Place 1 tablet (0.4 mg total) under the tongue every 5 (five) minutes as needed for chest pain. Jobe Mulder, DO Taking Active   nystatin  (MYCOSTATIN /NYSTOP ) powder [id number] Yes Apply 1 Application topically as needed. [provider] Taking Active   omeprazole  (PRILOSEC) 40 MG capsule [id number] Yes Take 1 capsule by mouth once daily Jobe Mulder, DO Taking Active    OneTouch Delica Lancets 33G MISC [id number] Yes Use to check blood glucose up to 4 times a day Jobe Mulder, DO Taking Active Self, Pharmacy Records  [facility] VERIO test strip [id number] Yes Use as instructed Jobe Mulder, DO Taking Active Self, Pharmacy Records  Oxycodone  HCl 20 MG TABS [id number] Yes Take 1 tablet (20 mg total) by mouth in the morning, at noon, and at bedtime. Jobe Mulder, DO Taking Active   Polyethyl Glyc-Propyl Glyc PF (SYSTANE PRESERVATIVE FREE) 0.4-0.3 % SOLN [id number] Yes Place 2 drops into both eyes in the morning and at bedtime. [provider] Taking Active Self, Pharmacy Records  pramipexole  (MIRAPEX ) 1 MG tablet [id number] Yes TAKE 1 TABLET BY MOUTH THREE TIMES DAILY Jobe Mulder, DO Taking Active   pregabalin  (LYRICA ) 50 MG capsule [id number] Yes TAKE 1 CAPSULE BY MOUTH THREE TIMES DAILY Raulkar, Keven Pel, MD Taking Active   promethazine  (PHENERGAN ) 25 MG tablet [id number] Yes Take 1 tablet (25 mg total) by mouth every 8 (eight) hours as needed for vomiting or nausea.  Patient taking differently: Take 25 mg by mouth as needed for vomiting or nausea.   Jobe Mulder, DO Taking Active Self, Pharmacy Records  spironolactone  (ALDACTONE ) 50 MG tablet [id number] Yes Take 1 tablet by mouth once daily Jobe Mulder, DO Taking Active   Vitamin D , Ergocalciferol , (DRISDOL ) 1.25 MG (50000 UNIT) CAPS capsule [id number] Yes Take 1 capsule by mouth once a week Jobe Mulder, DO Taking Active             Recommendation:   PCP Follow-up to discuss need for BPAP machine, overdue for A1C and other labs Continue to work with clinical pharmacist on BS and insulin  dosage  Follow Up Plan:   Telephone follow up appointment date/time:  02/07/24 at 11:00  Clarnce Crow BSN RN CCM [place]  [facility], [facility] Health RN Care Manager Direct Dial: [phone] Fax:  [phone]

## 2024-01-24 ENCOUNTER — Telehealth (HOSPITAL_BASED_OUTPATIENT_CLINIC_OR_DEPARTMENT_OTHER): Payer: Self-pay | Admitting: Licensed Clinical Social Worker

## 2024-01-24 NOTE — Telephone Encounter (Signed)
 H&V Care Navigation CSW Progress Note  Clinical Social Worker contacted patient by phone to f/u on release of information form emailed to pt at [email or web address] to complete referral to [facility] for disability assistance. LCSW was able to reach pt today at [phone]. She was able to locate emails, states with security filter she hasn't been able to download the attachment. Agreeable for me to send it via mail to home address and will let me know if able to get it to download from email as she has a printer at home.  Patient is participating in a Managed Medicaid Plan:  No, [employer] commercial plan only  SDOH Screenings   Food Insecurity: No Food Insecurity (01/02/2024)  Housing: Low Risk  (01/02/2024)  Transportation Needs: Unmet Transportation Needs (01/02/2024)  Utilities: Not At Risk (01/02/2024)  Recent Concern: Utilities - At Risk (10/22/2023)  Alcohol  Screen: Low Risk  (09/10/2023)  Depression (PHQ2-9): Low Risk  (01/01/2024)  Financial Resource Strain: Low Risk  (01/02/2024)  Physical Activity: Unknown (09/10/2023)  Social Connections: Socially Isolated (09/10/2023)  Stress: No Stress Concern Present (09/10/2023)  Tobacco Use: Medium Risk (11/19/2023)  Health Literacy: Adequate Health Literacy (01/02/2024)    Nathen Balder, MSW, LCSW Clinical Social Worker II [facility] Health Heart/Vascular Care Navigation  [phone]- work cell phone (preferred)

## 2024-01-29 ENCOUNTER — Telehealth: Payer: Self-pay | Admitting: Licensed Clinical Social Worker

## 2024-01-30 ENCOUNTER — Other Ambulatory Visit: Payer: Self-pay | Admitting: Family Medicine

## 2024-02-01 ENCOUNTER — Ambulatory Visit: Admitting: Family Medicine

## 2024-02-05 ENCOUNTER — Telehealth: Payer: Self-pay | Admitting: Licensed Clinical Social Worker

## 2024-02-05 NOTE — Telephone Encounter (Signed)
 H&V Care Navigation CSW Progress Note  Clinical Social Worker contacted patient by phone to f/u on patient release of information paper for [facility]. No answer this morning at [phone]. Left voicemail. Will f/u again if no return call as able.  Patient is participating in a Managed Medicaid Plan:  No, [employer] commercial plan only  SDOH Screenings   Food Insecurity: No Food Insecurity (01/02/2024)  Housing: Low Risk  (01/02/2024)  Transportation Needs: Unmet Transportation Needs (01/02/2024)  Utilities: Not At Risk (01/02/2024)  Recent Concern: Utilities - At Risk (10/22/2023)  Alcohol  Screen: Low Risk  (09/10/2023)  Depression (PHQ2-9): Low Risk  (01/01/2024)  Financial Resource Strain: Low Risk  (01/02/2024)  Physical Activity: Unknown (09/10/2023)  Social Connections: Socially Isolated (09/10/2023)  Stress: No Stress Concern Present (09/10/2023)  Tobacco Use: Medium Risk (11/19/2023)  Health Literacy: Adequate Health Literacy (01/02/2024)    Nathen Balder, MSW, LCSW Clinical Social Worker II [facility] Health Heart/Vascular Care Navigation  [phone]- work cell phone (preferred)

## 2024-02-06 ENCOUNTER — Ambulatory Visit (HOSPITAL_BASED_OUTPATIENT_CLINIC_OR_DEPARTMENT_OTHER): Admitting: General Surgery

## 2024-02-07 ENCOUNTER — Other Ambulatory Visit: Payer: Self-pay

## 2024-02-07 NOTE — Patient Outreach (Signed)
 Complex Care Management   Visit Note  02/07/2024  Name:  [name] MRN: [id number] DOB: 1961-06-04  Situation: Referral received for Complex Care Management related to Diabetes with Complications I obtained verbal consent from Patient.  Visit completed with patient  on the phone  Background:   Past Medical History:  Diagnosis Date   Aortic stenosis    mild AS by echo 02/2021   Asthma    "on daily RX and rescue inhaler" (04/18/2018)   Chronic diastolic CHF (congestive heart failure) (HCC) 09/2015   Chronic lower back pain    Chronic neck pain    Chronic pain syndrome    Fentanyl  Patch   Colon polyps    Coronary artery disease    cath with normal LM, 30% LAD, 85% mid RCA and 95% distal RCA s/p PCI of the mid to distal RCA and now on DAPT with ASA and Ticagrelor .     Eczema    Excessive daytime sleepiness 11/26/2015   Fibromyalgia    Gallstones    GERD (gastroesophageal reflux disease)    Heart murmur    "noted for the 1st time on 04/18/2018"   History of blood transfusion 07/2010   "S/P oophorectomy"   History of gout    History of hiatal hernia 1980s   "gone now" (04/18/2018)   Hyperlipidemia    Hypertension    takes Metoprolol  and Enalapril  daily   Hypothyroidism    takes Synthroid  daily   IBS (irritable bowel syndrome)    Migraine    "nothing in the 2000s" (04/18/2018)   Mixed connective tissue disease (HCC)    NAFLD (nonalcoholic fatty liver disease)    Pneumonia    "several times" (04/18/2018)   PVC's (premature ventricular contractions)    noted on event monitor 02/2021   Rheumatoid arthritis (HCC)    "hands, elbows, shoulders, probably knees" (04/18/2018)   Scoliosis    Sjogren's syndrome (HCC)    Sleep apnea    mild - does not use cpap   Spondylosis    Type II diabetes mellitus (HCC)    takes Metformin  and Hum R daily (04/18/2018)   Walker as ambulation aid    also uses wheelchair    Assessment: Patient Reported Symptoms:  Cognitive Cognitive Status: Alert  and oriented to person, place, and time      Neurological Neurological Review of Symptoms: Dizziness, Numbness (numbness in feet and hands.  Needs to see Neurology) Neurological Management Strategies: Medication therapy, Routine screening, Diet modification, Weight management  HEENT HEENT Symptoms Reported: Frequent sneezing, Runny nose HEENT Management Strategies: Medication therapy, Routine screening, Adequate rest    Cardiovascular Cardiovascular Symptoms Reported: Swelling in legs or feet, Fatigue Does patient have uncontrolled Hypertension?: Yes Is patient checking Blood Pressure at home?: Yes Patient's Recent BP reading at home: 139/63 02/07/24 Cardiovascular Conditions: Hypertension Cardiovascular Management Strategies: Routine screening, Weight management, Medication therapy Do You Have a Working Readable Scale?: No  Respiratory Respiratory Symptoms Reported: Shortness of breath, Wheezing, Chest tightness Additional Respiratory Details: daily nebulizer treatment and rescue inhalers Respiratory Conditions: Asthma  Endocrine Patient reports the following symptoms related to hypoglycemia or hyperglycemia : No symptoms reported Is patient diabetic?: Yes Is patient checking blood sugars at home?: Yes Endocrine Management Strategies: Medical device, Medication therapy, Routine screening Endocrine Comment: plans to schedule with Endo and working with clinical pharmacist  Gastrointestinal Gastrointestinal Symptoms Reported: Other Additional Gastrointestinal Details: elevates HOB, will discuss changing to new medication, reports Prilosec ineffective Gastrointestinal Conditions: Reflux/heartburn Gastrointestinal  Management Strategies: Medication therapy Nutrition Risk Screen (CP): Large or nonhealing wound, burn or pressure injury  Genitourinary Genitourinary Symptoms Reported: Other Other Genitourinary Symptoms: patient reports dark urine foul smelling for more than one month  will discuss  at visit 6/6  patient does not feel it is infection, but due to medications Additional Genitourinary Details: continues to be seen by [facility] for PU in genital area Genitourinary Management Strategies: Catheter, indwelling  Integumentary Additional Integumentary Details: PU gential area, PU bottom left foot, three small sores left breast reports almost healed Skin Conditions: Eczema, Itching, Pressure injury Skin Management Strategies: Routine screening, Medication therapy, Dressing changes Skin Comment: patient has appt with Wound Care 03/30/24 and weekly visits for wound care by [facility] RN [facility]  Musculoskeletal Musculoskelatal Symptoms Reviewed: Other Other Musculoskeletal Symptoms: patient still working with [facility] PT, able to stand for limited duration, needs to be able to transfer from bed to chair, chair to car in order to have in person medical care Musculoskeletal Conditions: Joint pain, Amputation (right mid thigh amputation 2022) Musculoskeletal Management Strategies: Coping strategies, Medical device, Weight management, Medication therapy, Routine screening      Psychosocial Psychosocial Symptoms Reported: Other Other Psychosocial Conditions: patient feeling sad and frustrated due to lack of improvement in her mobility, reports that wet weather causes her to have exacerbations in her breathing, general body pain etc.  Patient has expresses frustration over inability to manage blood sugar ranges. Behavioral Health Conditions: Other Behavioral Management Strategies: Coping strategies, Adequate rest, Support system Major Change/Loss/Stressor/Fears (CP): Medical condition, self Techniques to Cope with Loss/Stress/Change: Diversional activities Quality of Family Relationships: helpful, involved, supportive Do you feel physically threatened by others?: No      01/01/2024    3:50 PM  Depression screen PHQ 2/9  Decreased Interest 0  Down, Depressed, Hopeless 0  PHQ - 2 Score 0    Vitals:    02/07/24 1109  BP: 139/63    Medications Reviewed Today     Reviewed by Clarnce Crow, RN (Registered Nurse) on 02/07/24 at 1117  Med List Status: <None>   Medication Order Taking? Sig Documenting Provider Last Dose Status Informant  acetaminophen  (TYLENOL ) 500 MG tablet [id number] Yes Take 1,000 mg by mouth as needed for mild pain (pain score 1-3). [provider] Taking Active Self, Pharmacy Records  albuterol  (PROVENTIL ) (2.5 MG/3ML) 0.083% nebulizer solution [id number] Yes USE 1 VIAL IN NEBULIZER EVERY 4 HOURS AS NEEDED FOR WHEEZING FOR SHORTNESS OF BREATH Wendling, Shellie Dials, DO Taking Active Self, Pharmacy Records  albuterol  (VENTOLIN  HFA) 108 [id number] Base) MCG/ACT inhaler [id number] Yes INHALE 2 PUFFS BY MOUTH EVERY 6 HOURS AS NEEDED FOR WHEEZING FOR SHORTNESS OF BREATH Jobe Mulder, DO Taking Active   apixaban  (ELIQUIS ) 5 MG TABS tablet [id number] Yes Take 1 tablet (5 mg total) by mouth 2 (two) times daily. Jobe Mulder, DO Taking Active   augmented betamethasone  dipropionate (DIPROLENE -AF) 0.05 % cream [id number] Yes APPLY 1 APPLICATION TOPICALLY TWICE DAILY AS NEEDED (RASH) Jobe Mulder, DO Taking Active   Continuous Glucose Sensor (DEXCOM G7 SENSOR) MISC [id number] Yes USE TO CHECK FOR BLOOD SUGAR CONTINUOUSLY- CHANGE SENSOR EVERY 10 DAYS Wendling, Shellie Dials, DO Taking Active Self, Pharmacy Records  diclofenac  (FLECTOR ) 1.3 % 1 patch [id number]   Jobe Mulder, DO  Active   diclofenac  (FLECTOR ) 1.3 % [facility] [id number] Yes Place 1 patch onto the skin 2 (two) times daily. Wear patch up to 12 hours then remove and replace with new  patch as needed. Jobe Mulder, DO Taking Active   diclofenac  Sodium (PENNSAID ) 2 % SOLN [id number] Yes Apply 2 Pump (40 mg total) topically 2 (two) times daily as needed. 1 application to back twice daily Jobe Mulder, DO Taking Active   diltiazem  (CARDIZEM  CD) 360 MG 24 hr capsule [id number] Yes  Take 1 capsule (360 mg total) by mouth daily. Jobe Mulder, DO Taking Active   diphenhydrAMINE  (BENADRYL ) 25 mg capsule [id number] Yes Take 2 capsules (50 mg total) by mouth as needed (for allergic reaction prior to Epi Pen). Jobe Mulder, DO Taking Active   EPINEPHrine  0.3 mg/0.3 mL IJ SOAJ injection [id number] Yes Inject 0.3 mg into the muscle as needed for anaphylaxis. Jobe Mulder, DO Taking Active Self, Pharmacy Records           Med Note Randye Buttner   Mon Oct 08, 2023 12:03 PM) PRN for Anaphylaxis/allergic reaction  ferrous sulfate  (FEROSUL) 325 (65 FE) MG tablet [id number] Yes Take 1 tablet (325 mg total) by mouth 2 (two) times daily with a meal. Jobe Mulder, DO Taking Active   fluticasone  (FLONASE ) 50 MCG/ACT nasal spray [id number] Yes Place 2 sprays into both nostrils daily as needed for allergies or rhinitis. Jobe Mulder, DO Taking Active   furosemide  (LASIX ) 40 MG tablet [id number] Yes Take 1 tablet (40 mg total) by mouth daily.  Patient taking differently: Take 40 mg by mouth daily. Takes extra 0.5 tablet = 20mg  at noon if needed for swelling / edema   Jobe Mulder, DO Taking Active   gabapentin  (NEURONTIN ) 400 MG capsule [id number] Yes Take 1 capsule (400 mg total) by mouth 4 (four) times daily. TAKE 1 CAPSULE BY MOUTH IN THE MORNING AND 1 AT NOON AND 2 AT BEDTIME Strength: 400 mg Raulkar, Keven Pel, MD Taking Active Self, Pharmacy Records           Med Note [facility]   Wed Jan 02, 2024  4:01 PM)    gentamicin ointment (GARAMYCIN) 0.1 % [id number] Yes Apply 1 Application topically in the morning and at bedtime. [provider] Taking Active Self, Pharmacy Records  Glucagon , rDNA, (GLUCAGON  EMERGENCY) 1 MG KIT [id number] Yes Inject 1 mg into the vein daily as needed. Jobe Mulder, DO Taking Active   insulin  regular human CONCENTRATED (HUMULIN  R) 500 UNIT/ML injection [id number] Yes INJECT 0.08-0.3  MLS (40-150 UNITS) INTO THE SKIN THREE TIMES DAILY WITH MEALS Gwenette Lennox, Shellie Dials, DO Taking Active   Insulin  Syringes, Disposable, U-100 0.5 ML MISC [id number] Yes 100 each by Does not apply route 4 (four) times daily -  before meals and at bedtime. Maggie Schooner, MD Taking Active   isosorbide  mononitrate (IMDUR ) 60 MG 24 hr tablet [id number] Yes Take 1 tablet (60 mg total) by mouth daily. Swinyer, Leilani Punter, NP Taking Active Self, Pharmacy Records  levocetirizine (XYZAL ) 5 MG tablet [id number] Yes Take 1 tablet (5 mg total) by mouth every evening. Jobe Mulder, DO Taking Active   levothyroxine  (SYNTHROID ) 200 MCG tablet [id number] Yes TAKE 1 TABLET BY MOUTH ONCE DAILY BEFORE BREAKFAST Jobe Mulder, DO Taking Active   lidocaine  (LIDODERM ) 5 % [id number] Yes Place 1 patch onto the skin every 12 (twelve) hours. Remove & Discard patch within 12 hours or as directed  Patient taking differently: Place 1 patch onto the skin daily as needed (pain). Remove & Discard patch within 12 hours or as directed   Jodi Munroe,  NP Taking Active Self, Pharmacy Records  lidocaine  (XYLOCAINE ) 5 % ointment [id number] Yes Apply 1 Application topically in the morning and at bedtime. [provider] Taking Active Self, Pharmacy Records  metFORMIN  (GLUCOPHAGE -XR) 500 MG 24 hr tablet [id number] Yes Take 2 tablets by mouth twice daily Wendling, Shellie Dials, DO Taking Active   Multiple Vitamins-Minerals (WOMENS 50+ MULTI VITAMIN PO) [id number] Yes Take 1 tablet by mouth in the morning and at bedtime. [provider] Taking Active Self, Pharmacy Records  mupirocin  ointment (BACTROBAN ) 2 % [id number] Yes Apply topically 2 (two) times daily. Jobe Mulder, DO Taking Active   naloxone  (NARCAN ) nasal spray 4 mg/0.1 mL [id number] Yes Place 1 spray into the nose as needed (opoid overdose). Jobe Mulder, DO Taking Active   nitroGLYCERIN  (NITROSTAT ) 0.4 MG SL tablet [id number]  Yes Place 1 tablet (0.4 mg total) under the tongue every 5 (five) minutes as needed for chest pain. Jobe Mulder, DO Taking Active   nystatin  (MYCOSTATIN /NYSTOP ) powder [id number] Yes Apply 1 Application topically as needed. [provider] Taking Active   omeprazole  (PRILOSEC) 40 MG capsule [id number] Yes Take 1 capsule by mouth once daily Jobe Mulder, DO Taking Active   OneTouch Delica Lancets 33G MISC [id number] Yes Use to check blood glucose up to 4 times a day Jobe Mulder, DO Taking Active Self, Pharmacy Records  [facility] VERIO test strip [id number] Yes Use as instructed Jobe Mulder, DO Taking Active Self, Pharmacy Records  Oxycodone  HCl 20 MG TABS [id number] Yes Take 1 tablet (20 mg total) by mouth in the morning, at noon, and at bedtime. Jobe Mulder, DO Taking Active   Polyethyl Glyc-Propyl Glyc PF (SYSTANE PRESERVATIVE FREE) 0.4-0.3 % SOLN [id number] Yes Place 2 drops into both eyes in the morning and at bedtime. [provider] Taking Active Self, Pharmacy Records  pramipexole  (MIRAPEX ) 1 MG tablet [id number] Yes Take 1 tablet (1 mg total) by mouth 3 (three) times daily. Jobe Mulder, DO Taking Active   pregabalin  (LYRICA ) 50 MG capsule [id number] Yes TAKE 1 CAPSULE BY MOUTH THREE TIMES DAILY Raulkar, Keven Pel, MD Taking Active   promethazine  (PHENERGAN ) 25 MG tablet [id number] Yes Take 1 tablet (25 mg total) by mouth every 8 (eight) hours as needed for vomiting or nausea.  Patient taking differently: Take 25 mg by mouth as needed for vomiting or nausea.   Jobe Mulder, DO Taking Active Self, Pharmacy Records  spironolactone  (ALDACTONE ) 50 MG tablet [id number] Yes Take 1 tablet (50 mg total) by mouth daily. Jobe Mulder, DO Taking Active   Vitamin D , Ergocalciferol , (DRISDOL ) 1.25 MG (50000 UNIT) CAPS capsule [id number] Yes Take 1 capsule by mouth once a week Jobe Mulder, DO Taking  Active             Recommendation:   PCP Follow-up Schedule annual Diabetic eye exam Schedule six month dental exam Discuss with provider possible changes to medication tor CBG to improve glycemic control.  Follow Up Plan:   Telephone follow up appointment date/time:  02/19/24   Clarnce Crow BSN RN CCM [place]  [facility], [facility] RN Care Manager Direct Dial: [phone] Fax: [phone]

## 2024-02-07 NOTE — Patient Instructions (Signed)
 Visit Information  Thank you for taking time to visit with me today. Please don't hesitate to contact me if I can be of assistance to you before our next scheduled appointment.  Our next appointment is by telephone on 02/19/24 at 3:00 Please call the care guide team at [phone] if you need to cancel or reschedule your appointment.   Following is a copy of your care plan:   Goals Addressed             This Visit's Progress    VBCI RN Care Plan   Worsening    Problems:  Chronic Disease Management support and education needs related to CHF, DMII, HTN, and Skin breakdown  Goal: Over the next 3 months the Patient will attend all scheduled medical appointments:  with PCP 6/6/25and Wound clinic 03/20/24  as evidenced by chart review and patient report        continue to work with RN Care Manager and/or Social Worker to address care management and care coordination needs related to DMII and HTN as evidenced by adherence to CM Team Scheduled appointments     demonstrate Ongoing adherence to prescribed treatment plan for DMII as evidenced by proving fasting and post prandial blood sugar readings keeping with goal range of fasting <130, post prandial <180, and working with [facility] PT to meet goal of improved mobility, able to transfer from bed to chair with assistance.  Interventions:   Diabetes Interventions: Assessed patient's understanding of A1c goal: <6.5% Provided education to patient about basic DM disease process Reviewed medications with patient and discussed importance of medication adherence Counseled on importance of regular laboratory monitoring as prescribed Discussed plans with patient for ongoing care management follow up and provided patient with direct contact information for care management team Advised patient, providing education and rationale, to check cbg using CBG montior and record, calling PCP for findings outside established parameters Encouraged patient to continue to  work with Home Health PT to reach her goal of being able to transfer from bed to wheel chair, which will enable her to return to in person medical visits. Lab Results  Component Value Date   HGBA1C 8.0 (H) 09/10/2023    Patient [name] Activities:  Attend all scheduled provider appointments Call pharmacy for medication refills 3-7 days in advance of running out of medications Call provider office for new concerns or questions  Perform all self care activities independently  Take medications as prescribed   Work with the pharmacist to address medication management needs and will continue to work with the clinical team to address health care and disease management related needs Patient to schedule annual diabetic eye exam Patient to schedule 6 month dental exam  Plan:  Telephone follow up appointment with care management team member scheduled for:  02/19/24 at 3:00             Please call the Suicide and Crisis Lifeline: 988 call the [place]  National Suicide Prevention Lifeline: (847)230-0845 or TTY: 857-147-6695 TTY (272)169-1213) to talk to a trained counselor call 1-800-273-TALK (toll free, 24 hour hotline) if you are experiencing a Mental Health or Behavioral Health Crisis or need someone to talk to.  Patient verbalizes understanding of instructions and care plan provided today and agrees to view in MyChart. Active MyChart status and patient understanding of how to access instructions and care plan via MyChart confirmed with patient.      Clarnce Crow BSN RN CCM [place]  [facility], [facility] RN Care  Manager Direct Dial: [phone] Fax: [phone]

## 2024-02-11 ENCOUNTER — Other Ambulatory Visit: Payer: Self-pay | Admitting: Family Medicine

## 2024-02-12 ENCOUNTER — Telehealth: Payer: Self-pay | Admitting: Licensed Clinical Social Worker

## 2024-02-12 NOTE — Telephone Encounter (Signed)
 H&V Care Navigation CSW Progress Note  Clinical Social Worker contacted patient by phone to f/u on patient release of information form for disability should she be interested. Was able to reach her at [phone]. Confirmed it arrived from what she remembers but she has had a stomach bug all week and hasn't been able to review/complete it. Discussed if still interested how she can complete so we can formally refer to [facility] team. Pt still motivated to get to her in person PCP appt this Friday, provided encouragement and also encouraged pt to let us  know if any additional resources could be provided to help her with accessing in person appointments.   Patient is participating in a Managed Medicaid Plan:  No, [employer] commercial plan  SDOH Screenings   Food Insecurity: No Food Insecurity (01/02/2024)  Housing: Low Risk  (01/02/2024)  Transportation Needs: Unmet Transportation Needs (01/02/2024)  Utilities: Not At Risk (01/02/2024)  Recent Concern: Utilities - At Risk (10/22/2023)  Alcohol  Screen: Low Risk  (09/10/2023)  Depression (PHQ2-9): Low Risk  (01/01/2024)  Financial Resource Strain: Low Risk  (01/02/2024)  Physical Activity: Unknown (09/10/2023)  Social Connections: Socially Isolated (09/10/2023)  Stress: No Stress Concern Present (09/10/2023)  Tobacco Use: Medium Risk (11/19/2023)  Health Literacy: Adequate Health Literacy (01/02/2024)   Nathen Balder, MSW, LCSW Clinical Social Worker II [facility] Heart/Vascular Care Navigation  [phone]- work cell phone (preferred)

## 2024-02-13 ENCOUNTER — Telehealth: Payer: Self-pay

## 2024-02-13 NOTE — Telephone Encounter (Signed)
 Copied from CRM [id number]. Topic: Appointments - Scheduling Inquiry for Clinic >> Feb 13, 2024  3:24 PM [name] wrote: Reason for CRM: Patient needs to speak with Dr. [name] and had inquired about doing appt virtually, however, due to the past appt's being cancelled CAL had advised to leave the appt as in person due to potential need for weight/blood work etc. [name] says she is currently not mobile enough to get to an in-person visit but does need to speak with Dr. [name]. Patient will wait for call back.

## 2024-02-13 NOTE — Telephone Encounter (Signed)
 Pt is scheduled

## 2024-02-15 ENCOUNTER — Other Ambulatory Visit: Payer: Self-pay

## 2024-02-15 ENCOUNTER — Other Ambulatory Visit: Payer: Self-pay | Admitting: Family Medicine

## 2024-02-15 ENCOUNTER — Encounter: Payer: Self-pay | Admitting: Family Medicine

## 2024-02-15 ENCOUNTER — Telehealth: Admitting: Family Medicine

## 2024-02-15 DIAGNOSIS — D509 Iron deficiency anemia, unspecified: Secondary | ICD-10-CM

## 2024-02-15 DIAGNOSIS — Z794 Long term (current) use of insulin: Secondary | ICD-10-CM | POA: Diagnosis not present

## 2024-02-15 DIAGNOSIS — E1165 Type 2 diabetes mellitus with hyperglycemia: Secondary | ICD-10-CM

## 2024-02-15 DIAGNOSIS — G4733 Obstructive sleep apnea (adult) (pediatric): Secondary | ICD-10-CM | POA: Insufficient documentation

## 2024-02-15 MED ORDER — OXYCODONE HCL 20 MG PO TABS: 20.0000 mg | ORAL_TABLET | Freq: Three times a day (TID) | ORAL | 0 refills | Status: DC

## 2024-02-15 MED ORDER — INSULIN ASPART 100 UNIT/ML IJ SOLN: 15.0000 [IU] | Freq: Three times a day (TID) | INTRAMUSCULAR | 3 refills | Status: DC

## 2024-02-15 MED ORDER — BASAGLAR KWIKPEN 100 UNIT/ML ~~LOC~~ SOPN: 30.0000 [IU] | PEN_INJECTOR | Freq: Every day | SUBCUTANEOUS | 2 refills | Status: DC

## 2024-02-15 NOTE — Progress Notes (Signed)
 Chief Complaint  Patient presents with   Medication Refill    Medication Refill    Subjective: Patient is a 63 y.o. female here for f/u. We are interacting via web portal for an electronic face-to-face visit. I verified patient's ID using 2 identifiers. Patient agreed to proceed with visit via this method. Patient is at home, I am at office. Patient and I are present for visit.   Patient has a history of OSA on CPAP.  When she wears her CPAP machine, she feels claustrophobic and cannot keep it on.  She is being told she needs a letter from me to explain the situation so she may return it.  She will get me more information.  Patient has a history of diabetes.  She has been having very high levels and very low levels of sugar.  She is not seeing an endocrinologist now due to transportation issues.  Diet could be better.  She is not exercising routinely but is working with physical therapy.  She is compliant with medication.  She is currently taking around 15-25 units of Humalog and with meals twice daily.  She is not on a basal insulin  right now.  Past Medical History:  Diagnosis Date   Aortic stenosis    mild AS by echo 02/2021   Asthma    "on daily RX and rescue inhaler" (04/18/2018)   Chronic diastolic CHF (congestive heart failure) (HCC) 09/2015   Chronic lower back pain    Chronic neck pain    Chronic pain syndrome    Fentanyl  Patch   Colon polyps    Coronary artery disease    cath with normal LM, 30% LAD, 85% mid RCA and 95% distal RCA s/p PCI of the mid to distal RCA and now on DAPT with ASA and Ticagrelor .     Eczema    Excessive daytime sleepiness 11/26/2015   Fibromyalgia    Gallstones    GERD (gastroesophageal reflux disease)    Heart murmur    "noted for the 1st time on 04/18/2018"   History of blood transfusion 07/2010   "S/P oophorectomy"   History of gout    History of hiatal hernia 1980s   "gone now" (04/18/2018)   Hyperlipidemia    Hypertension    takes Metoprolol  and  Enalapril  daily   Hypothyroidism    takes Synthroid  daily   IBS (irritable bowel syndrome)    Migraine    "nothing in the 2000s" (04/18/2018)   Mixed connective tissue disease (HCC)    NAFLD (nonalcoholic fatty liver disease)    Pneumonia    "several times" (04/18/2018)   PVC's (premature ventricular contractions)    noted on event monitor 02/2021   Rheumatoid arthritis (HCC)    "hands, elbows, shoulders, probably knees" (04/18/2018)   Scoliosis    Sjogren's syndrome (HCC)    Sleep apnea    mild - does not use cpap   Spondylosis    Type II diabetes mellitus (HCC)    takes Metformin  and Hum R daily (04/18/2018)   Walker as ambulation aid    also uses wheelchair    Objective: No conversational dyspnea Age appropriate judgment and insight Nml affect and mood  Assessment and Plan: Type 2 diabetes mellitus with hyperglycemia, with long-term current use of insulin  (HCC) - Plan: Ambulatory referral to Endocrinology, insulin  aspart (NOVOLOG ) 100 UNIT/ML injection  OSA on CPAP  Chronic, not controlled.  Stop Humalog.  Changed to NovoLog  15 to 20 units with meals.  Start  Lantus  30 units nightly.  She wears around 285 pounds right now.  Will get her in with the endocrinology team as well.  Probably by the time she is able to safely leave her home, They will be able to get her in. Happy to write a letter but she will give us  more information. The patient voiced understanding and agreement to the plan.  Shellie Dials [place], DO 02/15/24  12:01 PM

## 2024-02-16 ENCOUNTER — Other Ambulatory Visit: Payer: Self-pay | Admitting: Family Medicine

## 2024-02-18 ENCOUNTER — Other Ambulatory Visit: Payer: Self-pay

## 2024-02-18 ENCOUNTER — Encounter: Payer: Self-pay | Admitting: Family Medicine

## 2024-02-18 ENCOUNTER — Telehealth: Payer: Self-pay

## 2024-02-18 MED ORDER — INSULIN SYRINGE 30G X 5/16" 0.5 ML MISC: 1 refills | Status: DC

## 2024-02-18 NOTE — Telephone Encounter (Signed)
 Copied from CRM [id number]. Topic: General - Other >> Feb 18, 2024 11:28 AM Adonis Hoot wrote: Reason for CRM: Samyah from [employer] called in to ask if order to discontinue patient ventilator has been sent over? Letter fax with discontinue order.  Fax#: [phone]

## 2024-02-19 ENCOUNTER — Telehealth: Payer: Self-pay

## 2024-02-19 ENCOUNTER — Ambulatory Visit: Payer: Self-pay

## 2024-02-19 NOTE — Telephone Encounter (Signed)
 Pt scheduled for VV 8 tomorrow.

## 2024-02-19 NOTE — Telephone Encounter (Signed)
 FYI Only or Action Required?: Action required by provider  Patient was last seen in primary care on 02/15/2024 by Jobe Mulder, DO. Called Nurse Triage reporting Hematuria. Blood in urine symptoms began today; patient reports abdominal pain x 1 week and last weekend she had vomiting. Interventions attempted: Nothing. Symptoms are: nausea, vomiting (resolved), lower abdominal cramping, cloudy tea-colored urine with blood gradually worsening.  Triage Disposition: See HCP Within 4 Hours (Or PCP Triage)- Refused, Home Health RN requesting orders  Patient/caregiver understands and will follow disposition?: No                              Copied from CRM [id number]. Topic: Clinical - Red Word Triage >> Feb 19, 2024  3:35 PM Alyse July wrote: Red Word that prompted transfer to Nurse Triage: Blood in Urine Reason for Disposition  Taking Coumadin (warfarin) or other strong blood thinner, or known bleeding disorder (e.g., thrombocytopenia)  Answer Assessment - Initial Assessment Questions 1. COLOR of URINE: "Describe the color of the urine."  (e.g., tea-colored, pink, red, bloody) "Do you have blood clots in your urine?" (e.g., none, pea, grape, small coin)     Cloudy, tea colored and red blood in tubing of catheter. No blood clots.  2. ONSET: "When did the bleeding start?"      Today.  3. EPISODES: "How many times has there been blood in the urine?" or "How many times today?"     Continuous as she has a urinary catheter in.  4. PAIN with URINATION: "Is there any pain with passing your urine?" If Yes, ask: "How bad is the pain?"  (Scale 1-10; or mild, moderate, severe)    - MILD: Complains slightly about urination hurting.    - MODERATE: Interferes with normal activities.      - SEVERE: Excruciating, unwilling or unable to urinate because of the pain.      Lower abdominal cramping 2/10. Began 02/13/24.  5. FEVER: "Do you have a fever?" If Yes, ask: "What is your  temperature, how was it measured, and when did it start?"     No, temp 98.2 today.  6. ASSOCIATED SYMPTOMS: "Are you passing urine more frequently than usual?"     N/A.  7. OTHER SYMPTOMS: "Do you have any other symptoms?" (e.g., back/flank pain, abdomen pain, vomiting)     Patient reports nausea but states this it normally for her to have it daily. Patient states last weekend she had vomiting.  8. PREGNANCY: "Is there any chance you are pregnant?" "When was your last menstrual period?"     N/A.  Urinary catheter last exchanged on 02/11/24. Home health RN, [place], states she did a dipstick test that was positive for leukocytes, nitrites, specific gravity and blood. She is requested an order from PCP if they would like her to obtain a urine sample and send it to the lab before 5pm for testing.  Protocols used: Urine - Blood In-A-AH

## 2024-02-19 NOTE — Progress Notes (Signed)
 Complex Care Management Care Guide Note  02/19/2024 Name: [name] MRN: [id number] DOB: [birth year]  [name] is a 63 y.o. year old female who is a primary care patient of Jobe Mulder, DO and is actively engaged with the care management team. I reached out to [facility] by phone today to assist with re-scheduling  with the RN Case Manager.  Follow up plan: Telephone appointment with complex care management team member scheduled for:  02/26/24 at 2:00 p.m.   [facility]  [facility], [facility] Health Care Management Assistant Direct Dial: [phone]  Fax: [phone]

## 2024-02-20 ENCOUNTER — Encounter: Payer: Self-pay | Admitting: Family Medicine

## 2024-02-20 ENCOUNTER — Telehealth: Payer: Self-pay

## 2024-02-20 ENCOUNTER — Other Ambulatory Visit: Payer: Self-pay

## 2024-02-20 ENCOUNTER — Telehealth (INDEPENDENT_AMBULATORY_CARE_PROVIDER_SITE_OTHER): Admitting: Family Medicine

## 2024-02-20 DIAGNOSIS — N3090 Cystitis, unspecified without hematuria: Secondary | ICD-10-CM | POA: Diagnosis not present

## 2024-02-20 DIAGNOSIS — E1165 Type 2 diabetes mellitus with hyperglycemia: Secondary | ICD-10-CM

## 2024-02-20 DIAGNOSIS — T83518A Infection and inflammatory reaction due to other urinary catheter, initial encounter: Secondary | ICD-10-CM | POA: Diagnosis not present

## 2024-02-20 MED ORDER — INSULIN SYRINGE 30G X 5/16" 0.5 ML MISC: 1 refills | Status: AC

## 2024-02-20 MED ORDER — BASAGLAR KWIKPEN 100 UNIT/ML ~~LOC~~ SOPN: 30.0000 [IU] | PEN_INJECTOR | Freq: Every day | SUBCUTANEOUS | 2 refills | Status: DC

## 2024-02-20 MED ORDER — NOVOLOG FLEXPEN 100 UNIT/ML ~~LOC~~ SOPN: PEN_INJECTOR | SUBCUTANEOUS | 3 refills | Status: DC

## 2024-02-20 MED ORDER — INSULIN ASPART 100 UNIT/ML IJ SOLN
15.0000 [IU] | Freq: Three times a day (TID) | INTRAMUSCULAR | 3 refills | Status: AC
Start: 2024-02-20 — End: ?

## 2024-02-20 MED ORDER — CEFPODOXIME PROXETIL 200 MG PO TABS: 200.0000 mg | ORAL_TABLET | Freq: Two times a day (BID) | ORAL | 0 refills | Status: AC

## 2024-02-20 NOTE — Telephone Encounter (Signed)
 Medication

## 2024-02-20 NOTE — Progress Notes (Signed)
 Chief Complaint  Patient presents with   Dysuria    Dysuria     [name] is a 63 y.o. female here for possible UTI. We are interacting via web portal for an electronic face-to-face visit. I verified patient's ID using 2 identifiers. Patient agreed to proceed with visit via this method. Patient is at home, I am at office. Patient and I are present for visit.   Duration: 10 days. Symptoms: Dysuria, hematuria and cramping around Foley She has a Foley catheter in Denies: fever, nausea, vomiting, flank, discharge No new sexual partners.  Past Medical History:  Diagnosis Date   Aortic stenosis    mild AS by echo 02/2021   Asthma    on daily RX and rescue inhaler (04/18/2018)   Chronic diastolic CHF (congestive heart failure) (HCC) 09/2015   Chronic lower back pain    Chronic neck pain    Chronic pain syndrome    Fentanyl  Patch   Colon polyps    Coronary artery disease    cath with normal LM, 30% LAD, 85% mid RCA and 95% distal RCA s/p PCI of the mid to distal RCA and now on DAPT with ASA and Ticagrelor .     Eczema    Excessive daytime sleepiness 11/26/2015   Fibromyalgia    Gallstones    GERD (gastroesophageal reflux disease)    Heart murmur    noted for the 1st time on 04/18/2018   History of blood transfusion 07/2010   S/P oophorectomy   History of gout    History of hiatal hernia 1980s   gone now (04/18/2018)   Hyperlipidemia    Hypertension    takes Metoprolol  and Enalapril  daily   Hypothyroidism    takes Synthroid  daily   IBS (irritable bowel syndrome)    Migraine    nothing in the 2000s (04/18/2018)   Mixed connective tissue disease (HCC)    NAFLD (nonalcoholic fatty liver disease)    Pneumonia    several times (04/18/2018)   PVC's (premature ventricular contractions)    noted on event monitor 02/2021   Rheumatoid arthritis (HCC)    hands, elbows, shoulders, probably knees (04/18/2018)   Scoliosis    Sjogren's syndrome (HCC)    Sleep apnea    mild -  does not use cpap   Spondylosis    Type II diabetes mellitus (HCC)    takes Metformin  and Hum R daily (04/18/2018)   Walker as ambulation aid    also uses wheelchair    Objective No conversational dyspnea Age appropriate judgment and insight Nml affect and mood  Catheter cystitis, initial encounter (HCC) - Plan: cefpodoxime (VANTIN) 200 MG tablet  Stay hydrated. Has tolerated cephalosporins in the past.  Seek immediate care if pt starts to develop fevers, new/worsening symptoms, uncontrollable N/V. Need to look into her insulin  as short acting was not approved by insurance.  F/u prn. The patient voiced understanding and agreement to the plan.  Shellie Dials Buchanan, DO 02/20/24 8:10 AM

## 2024-02-20 NOTE — Telephone Encounter (Signed)
 Message sent to PA team.

## 2024-02-21 ENCOUNTER — Emergency Department (HOSPITAL_COMMUNITY): Admission: EM | Admit: 2024-02-21 | Discharge: 2024-02-21 | Disposition: A | Discharge status: Home / Self Care

## 2024-02-21 ENCOUNTER — Other Ambulatory Visit (HOSPITAL_COMMUNITY): Payer: Self-pay

## 2024-02-21 ENCOUNTER — Telehealth: Payer: Self-pay

## 2024-02-21 DIAGNOSIS — Z7901 Long term (current) use of anticoagulants: Secondary | ICD-10-CM | POA: Insufficient documentation

## 2024-02-21 DIAGNOSIS — T83098A Other mechanical complication of other indwelling urethral catheter, initial encounter: Secondary | ICD-10-CM | POA: Diagnosis present

## 2024-02-21 DIAGNOSIS — R300 Dysuria: Secondary | ICD-10-CM | POA: Diagnosis not present

## 2024-02-21 DIAGNOSIS — R339 Retention of urine, unspecified: Secondary | ICD-10-CM | POA: Insufficient documentation

## 2024-02-21 DIAGNOSIS — Z91010 Allergy to peanuts: Secondary | ICD-10-CM | POA: Insufficient documentation

## 2024-02-21 DIAGNOSIS — Y828 Other medical devices associated with adverse incidents: Secondary | ICD-10-CM | POA: Diagnosis not present

## 2024-02-21 LAB — URINALYSIS, ROUTINE W REFLEX MICROSCOPIC
Bilirubin Urine: NEGATIVE
Glucose, UA: 50 mg/dL — AB
Hgb urine dipstick: NEGATIVE
Ketones, ur: NEGATIVE mg/dL
Nitrite: NEGATIVE
Protein, ur: NEGATIVE mg/dL
Specific Gravity, Urine: 1.021 (ref 1.005–1.030)
WBC, UA: >50 WBC/hpf (ref 0–5)
pH: 5 (ref 5.0–8.0)

## 2024-02-21 NOTE — Discharge Instructions (Signed)
 Please continue taking antibiotics as prescribed to you.  Return immediately felt fevers, chills, uncontrolled nausea vomiting, Foley catheter stops working, start having blood in the bag or you develop any new or worsening symptoms that are concerning to you.

## 2024-02-21 NOTE — Telephone Encounter (Signed)
 Pharmacy Patient Advocate Encounter   Received notification from Pt Calls Messages that prior authorization for Novolog  insulin  is required/requested.   Insurance verification completed.   The patient is insured through [employer] .   Per test claim: PA required; PA submitted to above mentioned insurance via CoverMyMeds Key/confirmation #/EOC [facility] Status is pending

## 2024-02-21 NOTE — Telephone Encounter (Signed)
 Pharmacy Patient Advocate Encounter  Received notification from [facility] that Prior Authorization for Novolog  insulin  has been APPROVED from 02/21/24 to 09/10/98. Ran test claim, Copay is $0. This test claim was processed through [facility]- copay amounts may vary at other pharmacies due to pharmacy/plan contracts, or as the patient moves through the different stages of their insurance plan.   PA #/Case ID/Reference #: [id number]  Left a message at [facility] pharmacy to notify of the approval

## 2024-02-21 NOTE — ED Notes (Signed)
 Removed another 12mL out of catheter was able to remove catheter.

## 2024-02-21 NOTE — ED Provider Notes (Signed)
 [place] EMERGENCY DEPARTMENT AT [facility] Provider Note   CSN: [id number] Arrival date & time: 02/21/24  1318     Patient presents with: urinary catheter issues  and Dysuria   [name] is a 63 y.o. female.  HYP 63 year old female presenting emergency department with Foley catheter issues.  Chronic indwelling Foley catheter.  Had discomfort yesterday with dysuria similar to prior urinary tract infections.  Primary doctor wrote her cefpodoxime, and recommended exchanging Foley catheter.  Home health nurse today unable to change catheter for which patient presented today.  Appears to be draining.  No fevers no chills.   Dysuria      Prior to Admission medications   Medication Sig Start Date End Date Taking? Authorizing Provider  acetaminophen  (TYLENOL ) 500 MG tablet Take 1,000 mg by mouth as needed for mild pain (pain score 1-3).    [provider]  albuterol  (PROVENTIL ) (2.5 MG/3ML) 0.083% nebulizer solution USE 1 VIAL IN NEBULIZER EVERY 4 HOURS AS NEEDED FOR WHEEZING FOR SHORTNESS OF BREATH 09/29/22   Wendling, Shellie Dials, DO  albuterol  (VENTOLIN  HFA) 108 (90 Base) MCG/ACT inhaler Inhale 2 puffs into the lungs every 4 (four) hours as needed for wheezing or shortness of breath. 02/15/24   Jobe Mulder, DO  apixaban  (ELIQUIS ) 5 MG TABS tablet Take 1 tablet (5 mg total) by mouth 2 (two) times daily. 01/30/24   Jobe Mulder, DO  augmented betamethasone  dipropionate (DIPROLENE -AF) 0.05 % cream APPLY 1 APPLICATION TOPICALLY TWICE DAILY AS NEEDED (RASH) 11/28/23   Wendling, Shellie Dials, DO  cefpodoxime (VANTIN) 200 MG tablet Take 1 tablet (200 mg total) by mouth 2 (two) times daily for 7 days. 02/20/24 02/27/24  Jobe Mulder, DO  Continuous Glucose Sensor (DEXCOM G7 SENSOR) MISC USE TO CHECK FOR BLOOD SUGAR CONTINUOUSLY- CHANGE SENSOR EVERY 10 DAYS 05/08/23   Jobe Mulder, DO  diclofenac  (FLECTOR ) 1.3 % PTCH PLACE 1 PATCH  ONTO THE SKIN TWICE DAILY - WEAR PATCH UP TO 12 HOURS THEN REMOVE AND REPLACE WITH A NEW PATCH AS NEEDED 02/11/24   Gwenette Lennox, Shellie Dials, DO  diclofenac  Sodium (PENNSAID ) 2 % SOLN Apply 2 Pump (40 mg total) topically 2 (two) times daily as needed. 1 application to back twice daily 10/10/23   Jobe Mulder, DO  diltiazem  (CARDIZEM  CD) 360 MG 24 hr capsule Take 1 capsule (360 mg total) by mouth daily. 10/10/23   Jobe Mulder, DO  diphenhydrAMINE  (BENADRYL ) 25 mg capsule Take 2 capsules (50 mg total) by mouth as needed (for allergic reaction prior to Epi Pen). 01/16/24   Jobe Mulder, DO  EPINEPHrine  0.3 mg/0.3 mL IJ SOAJ injection Inject 0.3 mg into the muscle as needed for anaphylaxis. 09/07/22   Jobe Mulder, DO  ferrous sulfate  (FEROSUL) 325 (65 FE) MG tablet Take 1 tablet (325 mg total) by mouth 2 (two) times daily with a meal. 01/30/24   Wendling, Shellie Dials, DO  fluticasone  (FLONASE ) 50 MCG/ACT nasal spray Place 2 sprays into both nostrils daily as needed for allergies or rhinitis. 01/16/24   Jobe Mulder, DO  furosemide  (LASIX ) 40 MG tablet Take 1 tablet (40 mg total) by mouth daily. Patient taking differently: Take 40 mg by mouth daily. Takes extra 0.5 tablet = 20mg  at noon if needed for swelling / edema 10/10/23   Jobe Mulder, DO  gabapentin  (NEURONTIN ) 400 MG capsule Take 1 capsule (400 mg total) by mouth 4 (four) times daily. TAKE 1  CAPSULE BY MOUTH IN THE MORNING AND 1 AT NOON AND 2 AT BEDTIME Strength: 400 mg 05/11/23   Raulkar, Keven Pel, MD  gentamicin ointment (GARAMYCIN) 0.1 % Apply 1 Application topically in the morning and at bedtime. 07/23/23   [provider]  Glucagon , rDNA, (GLUCAGON  EMERGENCY) 1 MG KIT Inject 1 mg into the vein daily as needed. 10/10/23   Jobe Mulder, DO  insulin  aspart (NOVOLOG  FLEXPEN) 100 UNIT/ML FlexPen Inject 15-20 Units into the skin 3 (three) times daily before meals 02/20/24    Wendling, Shellie Dials, DO  insulin  aspart (NOVOLOG ) 100 UNIT/ML injection Inject 15-20 Units into the skin 3 (three) times daily before meals. 02/20/24   Jobe Mulder, DO  Insulin  Glargine (BASAGLAR  KWIKPEN) 100 UNIT/ML Inject 30 Units into the skin at bedtime. 02/20/24   Jobe Mulder, DO  Insulin  Syringe-Needle U-100 (INSULIN  SYRINGE .5CC/30GX5/16) 30G X 5/16 0.5 ML MISC Take as directed with Novolog  inuslin.  15-20 units per day 02/20/24   Jobe Mulder, DO  Insulin  Syringes, Disposable, U-100 0.5 ML MISC 100 each by Does not apply route 4 (four) times daily -  before meals and at bedtime. 10/01/23   Amin, Ankit C, MD  isosorbide  mononitrate (IMDUR ) 60 MG 24 hr tablet Take 1 tablet (60 mg total) by mouth daily. 04/30/23   Swinyer, Leilani Punter, NP  levocetirizine (XYZAL ) 5 MG tablet Take 1 tablet (5 mg total) by mouth every evening. 01/16/24   Jobe Mulder, DO  levothyroxine  (SYNTHROID ) 200 MCG tablet TAKE 1 TABLET BY MOUTH ONCE DAILY BEFORE BREAKFAST 11/28/23   Wendling, Shellie Dials, DO  lidocaine  (LIDODERM ) 5 % Place 1 patch onto the skin every 12 (twelve) hours. Remove & Discard patch within 12 hours or as directed Patient taking differently: Place 1 patch onto the skin daily as needed (pain). Remove & Discard patch within 12 hours or as directed 04/12/23   Jodi Munroe, NP  lidocaine  (XYLOCAINE ) 5 % ointment Apply 1 Application topically in the morning and at bedtime. 02/02/23   [provider]  metFORMIN  (GLUCOPHAGE -XR) 500 MG 24 hr tablet Take 2 tablets by mouth twice daily 09/17/23   Wendling, Shellie Dials, DO  Multiple Vitamins-Minerals (WOMENS 50+ MULTI VITAMIN PO) Take 1 tablet by mouth in the morning and at bedtime.    [provider]  mupirocin  ointment (BACTROBAN ) 2 % Apply topically 2 (two) times daily. 10/10/23   Jobe Mulder, DO  naloxone  (NARCAN ) nasal spray 4 mg/0.1 mL Place 1 spray into the nose as needed (opoid  overdose). 10/10/23   Jobe Mulder, DO  nitroGLYCERIN  (NITROSTAT ) 0.4 MG SL tablet Place 1 tablet (0.4 mg total) under the tongue every 5 (five) minutes as needed for chest pain. 10/10/23   Jobe Mulder, DO  nystatin  (MYCOSTATIN /NYSTOP ) powder Apply 1 Application topically as needed.    [provider]  omeprazole  (PRILOSEC) 40 MG capsule Take 1 capsule by mouth once daily 11/28/23   Wendling, Shellie Dials, DO  OneTouch Delica Lancets 33G MISC Use to check blood glucose up to 4 times a day 11/16/22   Jobe Mulder, DO  [facility] VERIO test strip Use as instructed 06/27/23   Jobe Mulder, DO  Oxycodone  HCl 20 MG TABS Take 1 tablet (20 mg total) by mouth in the morning, at noon, and at bedtime. 02/15/24   Wendling, Shellie Dials, DO  Polyethyl Glyc-Propyl Glyc PF (SYSTANE PRESERVATIVE FREE) 0.4-0.3 % SOLN Place 2 drops into  both eyes in the morning and at bedtime. 05/11/16   [provider]  pramipexole  (MIRAPEX ) 1 MG tablet Take 1 tablet (1 mg total) by mouth 3 (three) times daily. 01/30/24   Jobe Mulder, DO  pregabalin  (LYRICA ) 50 MG capsule TAKE 1 CAPSULE BY MOUTH THREE TIMES DAILY 12/25/23   Raulkar, Keven Pel, MD  promethazine  (PHENERGAN ) 25 MG tablet Take 1 tablet (25 mg total) by mouth every 8 (eight) hours as needed for vomiting or nausea. Patient taking differently: Take 25 mg by mouth as needed for vomiting or nausea. 04/20/23   Jobe Mulder, DO  spironolactone  (ALDACTONE ) 50 MG tablet Take 1 tablet (50 mg total) by mouth daily. 01/30/24   Jobe Mulder, DO  Vitamin D , Ergocalciferol , (DRISDOL ) 1.25 MG (50000 UNIT) CAPS capsule Take 1 capsule by mouth once a week 12/06/23   Jobe Mulder, DO    Allergies: Fish allergy , Fish oil, Gabapentin , Ipratropium-albuterol , Omega-3 fatty acids, Peanut -containing drug products, Victoza  [liraglutide ], Lovastatin, Metronidazole , Penicillins, Red yeast rice  [cholestin], Rosuvastatin , Rotigotine , Atrovent hfa [ipratropium bromide hfa], Iodine , Ipratropium bromide, Morphine , Other, Pepto-bismol [bismuth subsalicylate], Wound dressing adhesive, Enalapril , Metoprolol , Suprep [na sulfate-k sulfate-mg sulf], and Tape    Review of Systems  Genitourinary:  Positive for dysuria.    Updated Vital Signs BP (!) 158/52   Pulse 64   Temp 98.3 F (36.8 C) (Oral)   Resp 16   SpO2 92%   Physical Exam Vitals and nursing note reviewed.  Constitutional:      General: She is not in acute distress.    Appearance: She is obese. She is not toxic-appearing.  HENT:     Head: Normocephalic.     Nose: Nose normal.   Eyes:     Conjunctiva/sclera: Conjunctivae normal.    Cardiovascular:     Rate and Rhythm: Normal rate and regular rhythm.  Pulmonary:     Effort: Pulmonary effort is normal.     Breath sounds: Normal breath sounds.  Abdominal:     General: Abdomen is flat. There is no distension.     Tenderness: There is no abdominal tenderness. There is no guarding or rebound.   Musculoskeletal:        General: Normal range of motion.   Skin:    General: Skin is warm and dry.     Capillary Refill: Capillary refill takes less than 2 seconds.   Neurological:     Mental Status: She is alert. Mental status is at baseline.   Psychiatric:        Mood and Affect: Mood normal.        Behavior: Behavior normal.     (all labs ordered are listed, but only abnormal results are displayed) Labs Reviewed  URINALYSIS, ROUTINE W REFLEX MICROSCOPIC    EKG: None  Radiology: No results found.   Procedures   Medications Ordered in the ED - No data to display                                  Medical Decision Making This is a 63 year old female presenting emergency department for difficulty exchanging Foley catheter outpatient with home health.  She is afebrile nontachycardic, slightly hypertensive.  Exam with benign abdomen.  Foley catheter seems to  be draining, unclear issue with exchanging Foley.  Nursing staff today able to exchange.  UA is pending, but patient has already been started on cefpodoxime and  with chronic indwelling Foley.  Stable for discharge.  Follow-up with primary doctor.  Amount and/or Complexity of Data Reviewed Labs: ordered.      Final diagnoses:  Urinary retention    ED Discharge Orders     None          Rolinda Climes, DO 02/21/24 1524

## 2024-02-21 NOTE — ED Triage Notes (Addendum)
 Pt bib gcems for possible urinary catheter dislodge  Dx with uti yesterday

## 2024-02-22 ENCOUNTER — Ambulatory Visit: Payer: Self-pay

## 2024-02-22 ENCOUNTER — Other Ambulatory Visit: Payer: Self-pay | Admitting: Family Medicine

## 2024-02-22 NOTE — Telephone Encounter (Signed)
 Copied from CRM [id number]. Topic: Clinical - Red Word Triage >> Feb 22, 2024 12:50 PM Luane Rumps D wrote: Red Word that prompted transfer to Nurse Triage: Patients new insulin  is not working, blood sugar is at 400. insulin  aspart (NOVOLOG ) 100 UNIT/ML injection, would like permission to take more.   Reason for Disposition  [1] Blood glucose > 300 mg/dL (04.5 mmol/L) AND [4] two or more times in a row  Answer Assessment - Initial Assessment Questions 1. BLOOD GLUCOSE: What is your blood glucose level?      360 per fingerstick, 400+ per dexcom 2. ONSET: When did you check the blood glucose?     Now 3. USUAL RANGE: What is your glucose level usually? (e.g., usual fasting morning value, usual evening value)     200-300, recently had insulin  changed  4. KETONES: Do you check for ketones (urine or blood test strips)? If Yes, ask: What does the test show now?      Doesn't have supplies  5. TYPE 1 or 2:  Do you know what type of diabetes you have?  (e.g., Type 1, Type 2, Gestational; doesn't know)      Type 2 6. INSULIN : Do you take insulin ? What type of insulin (s) do you use? What is the mode of delivery? (syringe, pen; injection or pump)?      Yes, injection  7. DIABETES PILLS: Do you take any pills for your diabetes? If Yes, ask: Have you missed taking any pills recently?     No 8. OTHER SYMPTOMS: Do you have any symptoms? (e.g., fever, frequent urination, difficulty breathing, dizziness, weakness, vomiting)     Nausea    Patient requesting advice to lower blood sugar and if she should take more insulin . Patient also wanted to know if Dr. Gwenette Lennox would like her to extend her antibiotic prescription for her UTI.  Protocols used: Diabetes - High Blood Sugar-A-AH   FYI Only or Action Required?: Action required by provider  Patient was last seen in primary care on 02/20/2024 by Jobe Mulder, DO. Called Nurse Triage reporting Blood Sugar Problem. Symptoms began  today. Interventions attempted: Prescription medications: Took prescribed insluin. Symptoms are: unchanged.  Triage Disposition: Call PCP Now  Patient/caregiver understands and will follow disposition?: Yes

## 2024-02-22 NOTE — Telephone Encounter (Signed)
 Called pt and she take short acting insulin  about 1:00 pm, Pt said blood sugar is  333 at 4:22 pm.

## 2024-02-22 NOTE — Telephone Encounter (Signed)
 Spoke with Pt and she ask if she should go up by 5 on her insult and Dr.Wendling said yes to go up on both Insults. Pt stated she understand, Pt was advised to send us  some reading.

## 2024-02-25 ENCOUNTER — Ambulatory Visit: Admitting: Family Medicine

## 2024-02-26 ENCOUNTER — Other Ambulatory Visit: Payer: Self-pay

## 2024-02-26 DIAGNOSIS — T83518A Infection and inflammatory reaction due to other urinary catheter, initial encounter: Secondary | ICD-10-CM

## 2024-02-26 NOTE — Patient Instructions (Signed)
 Visit Information  Thank you for taking time to visit with me today. Please don't hesitate to contact me if I can be of assistance to you before our next scheduled appointment.  Our next appointment is by telephone on 03/11/24 at 2:00 Please call the care guide team at [phone] if you need to cancel or reschedule your appointment.   Following is a copy of your care plan:   Goals Addressed             This Visit's Progress    VBCI RN Care Plan   Improving    Problems:  Chronic Disease Management support and education needs related to CHF, DMII, HTN, and Skin breakdown  Goal: Over the next 3 months the Patient will attend all scheduled medical appointments:  with Wound clinic 03/20/24  as evidenced by chart review and patient report        continue to work with RN Care Manager and/or Social Worker to address care management and care coordination needs related to DMII and HTN as evidenced by adherence to CM Team Scheduled appointments     demonstrate Ongoing adherence to prescribed treatment plan for DMII as evidenced by proving fasting and post prandial blood sugar readings keeping with goal range of fasting <130, post prandial <180, and working with [facility] PT to meet goal of improved mobility, able to transfer from bed to chair with assistance.  Interventions:   Diabetes Interventions: Assessed patient's understanding of A1c goal: <6.5% Provided education to patient about basic DM disease process Reviewed medications with patient and discussed importance of medication adherence Counseled on importance of regular laboratory monitoring as prescribed Discussed plans with patient for ongoing care management follow up and provided patient with direct contact information for care management team Advised patient, providing education and rationale, to check cbg using CBG montior and record, calling PCP for findings outside established parameters Encouraged patient to continue to work with  Home Health PT to reach her goal of being able to transfer from bed to wheel chair, which will enable her to return to in person medical visits. Lab Results  Component Value Date   HGBA1C 8.0 (H) 09/10/2023    Patient [name] Activities:  Attend all scheduled provider appointments Call pharmacy for medication refills 3-7 days in advance of running out of medications Call provider office for new concerns or questions  Perform all self care activities independently  Take medications as prescribed   Work with the pharmacist to address medication management needs and will continue to work with the clinical team to address health care and disease management related needs Patient to schedule annual diabetic eye exam Patient to schedule 6 month dental exam  Plan:  Telephone follow up appointment with care management team member scheduled for:  03/11/24 at 2:00             Please call the Suicide and Crisis Lifeline: 988 call the [place]  National Suicide Prevention Lifeline: 979-174-0898 or TTY: 325 875 7916 TTY (332) 200-9233) to talk to a trained counselor call 1-800-273-TALK (toll free, 24 hour hotline) if you are experiencing a Mental Health or Behavioral Health Crisis or need someone to talk to.  Patient verbalizes understanding of instructions and care plan provided today and agrees to view in MyChart. Active MyChart status and patient understanding of how to access instructions and care plan via MyChart confirmed with patient.     Clarnce Crow BSN RN CCM [place]  [facility], [facility] RN Care Manager Direct Dial:  [phone] Fax: [phone]

## 2024-02-26 NOTE — Patient Outreach (Signed)
 Complex Care Management   Visit Note  02/26/2024  Name:  [name] MRN: [id number] DOB: [birth year]  Situation: Referral received for Complex Care Management related to Diabetes with Complications I obtained verbal consent from Patient.  Visit completed with patient  on the phone  Background:   Past Medical History:  Diagnosis Date   Aortic stenosis    mild AS by echo 02/2021   Asthma    on daily RX and rescue inhaler (04/18/2018)   Chronic diastolic CHF (congestive heart failure) (HCC) 09/2015   Chronic lower back pain    Chronic neck pain    Chronic pain syndrome    Fentanyl  Patch   Colon polyps    Coronary artery disease    cath with normal LM, 30% LAD, 85% mid RCA and 95% distal RCA s/p PCI of the mid to distal RCA and now on DAPT with ASA and Ticagrelor .     Eczema    Excessive daytime sleepiness 11/26/2015   Fibromyalgia    Gallstones    GERD (gastroesophageal reflux disease)    Heart murmur    noted for the 1st time on 04/18/2018   History of blood transfusion 07/2010   S/P oophorectomy   History of gout    History of hiatal hernia 1980s   gone now (04/18/2018)   Hyperlipidemia    Hypertension    takes Metoprolol  and Enalapril  daily   Hypothyroidism    takes Synthroid  daily   IBS (irritable bowel syndrome)    Migraine    nothing in the 2000s (04/18/2018)   Mixed connective tissue disease (HCC)    NAFLD (nonalcoholic fatty liver disease)    Pneumonia    several times (04/18/2018)   PVC's (premature ventricular contractions)    noted on event monitor 02/2021   Rheumatoid arthritis (HCC)    hands, elbows, shoulders, probably knees (04/18/2018)   Scoliosis    Sjogren's syndrome (HCC)    Sleep apnea    mild - does not use cpap   Spondylosis    Type II diabetes mellitus (HCC)    takes Metformin  and Hum R daily (04/18/2018)   Walker as ambulation aid    also uses wheelchair    Assessment: Patient Reported Symptoms:  Cognitive Cognitive Status: Alert  and oriented to person, place, and time      Neurological Neurological Review of Symptoms: Dizziness, Numbness Neurological Management Strategies: Routine screening Neurological Comment: occasional dizziness,  diabetic neuropathy bilateral hands and left foot  HEENT HEENT Symptoms Reported: Nasal discharge, Frequent sneezing HEENT Management Strategies: Weight management HEENT Comment: allergy  medications daily    Cardiovascular Cardiovascular Symptoms Reported: Swelling in legs or feet (swelling significantly improvemen) Does patient have uncontrolled Hypertension?: Yes Is patient checking Blood Pressure at home?: Yes Patient's Recent BP reading at home: 120/82    Respiratory Respiratory Symptoms Reported: Wheezing, Shortness of breath, Chest tightness, Productive cough Additional Respiratory Details: daily nebulizer and rescue inhalers Respiratory Conditions: Shortness of breath, Sleep disordered breathing  Endocrine Patient reports the following symptoms related to hypoglycemia or hyperglycemia : No symptoms reported Is patient diabetic?: Yes Is patient checking blood sugars at home?: Yes Endocrine Conditions: Diabetes Endocrine Management Strategies: Weight management  Gastrointestinal Additional Gastrointestinal Details: GERD symptoms Gastrointestinal Conditions: Reflux/heartburn Gastrointestinal Management Strategies: Medication therapy, Diet modification Nutrition Risk Screen (CP): Large or nonhealing wound, burn or pressure injury  Genitourinary Genitourinary Symptoms Reported: No symptoms reported Other Genitourinary Symptoms: patient was in ED for foley change/UTI, completed course of abx,  reports sx resolved Additional Genitourinary Details: Patient reports PU to genital area are healed!!! Genitourinary Conditions: Urinary tract infection Genitourinary Management Strategies: Catheter, indwelling Indwelling Catheter Inserted: 02/21/24  Integumentary Integumentary Symptoms  Reported: Wound Additional Integumentary Details: PU bottom ball of left foot deeper but smaller in circumference, HH RN visiting weekly Skin Conditions: Pressure injury Skin Management Strategies: Dressing changes, Medication therapy, Routine screening, Diet modification, Adequate rest  Musculoskeletal Musculoskelatal Symptoms Reviewed: Other Other Musculoskeletal Symptoms: patient bed bound Musculoskeletal Conditions: Mobility limited, Amputation Musculoskeletal Management Strategies: Adequate rest, Routine screening, Exercise Falls in the past year?: No    Psychosocial Psychosocial Symptoms Reported: No symptoms reported            01/01/2024    3:50 PM  Depression screen PHQ 2/9  Decreased Interest 0  Down, Depressed, Hopeless 0  PHQ - 2 Score 0    Vitals:   02/26/24 1418  BP: 120/82    Medications Reviewed Today     Reviewed by Clarnce Crow, RN (Registered Nurse) on 02/26/24 at 1445  Med List Status: <None>   Medication Order Taking? Sig Documenting Provider Last Dose Status Informant  acetaminophen  (TYLENOL ) 500 MG tablet [id number]  Take 1,000 mg by mouth as needed for mild pain (pain score 1-3). [provider]  Active Self, Pharmacy Records  albuterol  (PROVENTIL ) (2.5 MG/3ML) 0.083% nebulizer solution [id number]  USE 1 VIAL IN NEBULIZER EVERY 4 HOURS AS NEEDED FOR WHEEZING FOR SHORTNESS OF BREATH Wendling, Shellie Dials, DO  Active Self, Pharmacy Records  albuterol  (VENTOLIN  HFA) [phone] Base) MCG/ACT inhaler [id number]  Inhale 2 puffs into the lungs every 4 (four) hours as needed for wheezing or shortness of breath. Jobe Mulder, DO  Active   apixaban  (ELIQUIS ) 5 MG TABS tablet [id number]  Take 1 tablet (5 mg total) by mouth 2 (two) times daily. Jobe Mulder, DO  Active   augmented betamethasone  dipropionate (DIPROLENE -AF) 0.05 % cream [id number]  APPLY 1 APPLICATION TOPICALLY TWICE DAILY AS NEEDED (RASH) Gwenette Lennox, Shellie Dials, DO  Active    cefpodoxime  (VANTIN ) 200 MG tablet [id number] Yes Take 1 tablet (200 mg total) by mouth 2 (two) times daily for 7 days.  Patient taking differently: Take 200 mg by mouth 2 (two) times daily. Patient has one dose remaining  abx prescribed for UTI 02/21/24   Jobe Mulder, DO  Active   Continuous Glucose Sensor (DEXCOM G7 SENSOR) [place] [id number]  USE TO CHECK FOR BLOOD SUGAR CONTINUOUSLY- CHANGE SENSOR EVERY 10 DAYS Jobe Mulder, DO  Active Self, Pharmacy Records  diclofenac  (FLECTOR ) 1.3 % 1 patch [id number]   Jobe Mulder, DO  Active   diclofenac  (FLECTOR ) 1.3 % PTCH [id number]  PLACE 1 PATCH ONTO THE SKIN TWICE DAILY - WEAR PATCH UP TO 12 HOURS THEN REMOVE AND REPLACE WITH A NEW PATCH AS NEEDED Jobe Mulder, DO  Active   diclofenac  Sodium (PENNSAID ) 2 % SOLN [id number]  Apply 2 Pump (40 mg total) topically 2 (two) times daily as needed. 1 application to back twice daily Jobe Mulder, DO  Active   diltiazem  (CARDIZEM  CD) 360 MG 24 hr capsule [id number]  Take 1 capsule (360 mg total) by mouth daily. Jobe Mulder, DO  Active   diphenhydrAMINE  (BENADRYL ) 25 mg capsule [id number]  Take 2 capsules (50 mg total) by mouth as needed (for allergic reaction prior to Epi Pen). Jobe Mulder, DO  Active   EPINEPHrine  0.3 mg/0.3 mL IJ SOAJ injection [id number]  Inject 0.3 mg into the muscle as needed for anaphylaxis. Jobe Mulder, DO  Active Self, Pharmacy Records           Med Note Randye Buttner   Mon Oct 08, 2023 12:03 PM) PRN for Anaphylaxis/allergic reaction  ferrous sulfate  (FEROSUL) 325 (65 FE) MG tablet [id number]  Take 1 tablet (325 mg total) by mouth 2 (two) times daily with a meal. Jobe Mulder, DO  Active   fluticasone  (FLONASE ) 50 MCG/ACT nasal spray [id number]  Place 2 sprays into both nostrils daily as needed for allergies or rhinitis. Jobe Mulder, DO  Active   furosemide  (LASIX ) 40 MG tablet  [id number]  Take 1 tablet (40 mg total) by mouth daily.  Patient taking differently: Take 40 mg by mouth daily. Takes extra 0.5 tablet = 20mg  at noon if needed for swelling / edema   Jobe Mulder, DO  Active   gabapentin  (NEURONTIN ) 400 MG capsule [id number]  Take 1 capsule (400 mg total) by mouth 4 (four) times daily. TAKE 1 CAPSULE BY MOUTH IN THE MORNING AND 1 AT NOON AND 2 AT BEDTIME Strength: 400 mg Raulkar, Keven Pel, MD  Active Self, Pharmacy Records           Med Note [facility], [facility]   Wed Jan 02, 2024  4:01 PM)    gentamicin ointment (GARAMYCIN) 0.1 % [id number]  Apply 1 Application topically in the morning and at bedtime. [provider]  Active Self, Pharmacy Records  Glucagon , rDNA, (GLUCAGON  EMERGENCY) 1 MG KIT [id number]  Inject 1 mg into the vein daily as needed. Jobe Mulder, DO  Active   insulin  aspart (NOVOLOG  FLEXPEN) 100 UNIT/ML FlexPen [id number] Yes Inject 15-20 Units into the skin 3 (three) times daily before meals  Patient taking differently: 40 Units 3 (three) times daily with meals. Inject 15-20 Units into the skin 3 (three) times daily before meals   Wendling, Shellie Dials, DO  Active   Insulin  Glargine (BASAGLAR  KWIKPEN) 100 UNIT/ML [id number] Yes Inject 30 Units into the skin at bedtime.  Patient taking differently: Inject 40 Units into the skin at bedtime.   Jobe Mulder, DO  Active   Insulin  Syringe-Needle U-100 (INSULIN  SYRINGE .5CC/30GX5/16) 30G X 5/16 0.5 ML MISC [id number]  Take as directed with Novolog  inuslin.  15-20 units per day Jobe Mulder, DO  Active   Insulin  Syringes, Disposable, U-100 0.5 ML MISC [id number]  100 each by Does not apply route 4 (four) times daily -  before meals and at bedtime. Amin, Ankit C, MD  Active   isosorbide  mononitrate (IMDUR ) 60 MG 24 hr tablet [id number]  Take 1 tablet (60 mg total) by mouth daily. Swinyer, Leilani Punter, NP  Active Self, Pharmacy Records  levocetirizine (XYZAL ) 5 MG  tablet [id number]  Take 1 tablet (5 mg total) by mouth every evening. Jobe Mulder, DO  Active   levothyroxine  (SYNTHROID ) 200 MCG tablet [id number]  TAKE 1 TABLET BY MOUTH ONCE DAILY BEFORE BREAKFAST Wendling, Shellie Dials, DO  Active   lidocaine  (LIDODERM ) 5 % [id number]  Place 1 patch onto the skin every 12 (twelve) hours. Remove & Discard patch within 12 hours or as directed  Patient taking differently: Place 1 patch onto the skin daily as needed (pain). Remove & Discard patch within 12 hours or as directed   Jodi Munroe, NP  Active Self, Pharmacy Records  lidocaine  (XYLOCAINE ) 5 % ointment [id number]  Apply 1 Application topically in the  morning and at bedtime. [provider]  Active Self, Pharmacy Records  metFORMIN  (GLUCOPHAGE -XR) 500 MG 24 hr tablet [id number]  Take 2 tablets by mouth twice daily Wendling, Shellie Dials, DO  Active   Multiple Vitamins-Minerals (WOMENS 50+ MULTI VITAMIN PO) [id number]  Take 1 tablet by mouth in the morning and at bedtime. [provider]  Active Self, Pharmacy Records  mupirocin  ointment (BACTROBAN ) 2 % [id number]  Apply topically 2 (two) times daily. Jobe Mulder, DO  Active   naloxone  (NARCAN ) nasal spray 4 mg/0.1 mL [id number]  Place 1 spray into the nose as needed (opoid overdose). Jobe Mulder, DO  Active   nitroGLYCERIN  (NITROSTAT ) 0.4 MG SL tablet [id number]  Place 1 tablet (0.4 mg total) under the tongue every 5 (five) minutes as needed for chest pain. Jobe Mulder, DO  Active   nystatin  (MYCOSTATIN /NYSTOP ) powder [id number]  Apply 1 Application topically as needed. [provider]  Active   omeprazole  (PRILOSEC) 40 MG capsule [id number]  Take 1 capsule by mouth once daily Jobe Mulder, DO  Active   OneTouch Delica Lancets 33G MISC [id number]  Use to check blood glucose up to 4 times a day Jobe Mulder, DO  Active Self, Pharmacy Records  [facility] VERIO test strip  [id number]  Use as instructed Jobe Mulder, DO  Active Self, Pharmacy Records  Oxycodone  HCl 20 MG TABS [id number]  Take 1 tablet (20 mg total) by mouth in the morning, at noon, and at bedtime. Jobe Mulder, DO  Active   Polyethyl Glyc-Propyl Glyc PF (SYSTANE PRESERVATIVE FREE) 0.4-0.3 % SOLN [id number]  Place 2 drops into both eyes in the morning and at bedtime. [provider]  Active Self, Pharmacy Records  pramipexole  (MIRAPEX ) 1 MG tablet [id number]  Take 1 tablet (1 mg total) by mouth 3 (three) times daily. Jobe Mulder, DO  Active   pregabalin  (LYRICA ) 50 MG capsule [id number]  TAKE 1 CAPSULE BY MOUTH THREE TIMES DAILY Raulkar, Keven Pel, MD  Active   promethazine  (PHENERGAN ) 25 MG tablet [id number]  Take 1 tablet (25 mg total) by mouth every 8 (eight) hours as needed for vomiting or nausea.  Patient taking differently: Take 25 mg by mouth as needed for vomiting or nausea.   Jobe Mulder, DO  Active Self, Pharmacy Records  spironolactone  (ALDACTONE ) 50 MG tablet [id number]  Take 1 tablet (50 mg total) by mouth daily. Jobe Mulder, DO  Active   Vitamin D , Ergocalciferol , (DRISDOL ) 1.25 MG (50000 UNIT) CAPS capsule [id number]  Take 1 capsule by mouth once a week Jobe Mulder, DO  Active             Recommendation:   Continue Current Plan of Care  Follow Up Plan:   Telephone follow up appointment date/time:  03/11/24 at 2:00   Clarnce Crow BSN RN CCM [place]  [facility], [facility] RN Care Manager Direct Dial: [phone] Fax: [phone]

## 2024-02-28 ENCOUNTER — Other Ambulatory Visit: Payer: Self-pay | Admitting: Family Medicine

## 2024-02-28 DIAGNOSIS — E559 Vitamin D deficiency, unspecified: Secondary | ICD-10-CM

## 2024-03-03 ENCOUNTER — Telehealth: Payer: Self-pay

## 2024-03-03 ENCOUNTER — Other Ambulatory Visit (INDEPENDENT_AMBULATORY_CARE_PROVIDER_SITE_OTHER)

## 2024-03-03 DIAGNOSIS — Z794 Long term (current) use of insulin: Secondary | ICD-10-CM

## 2024-03-03 DIAGNOSIS — E1165 Type 2 diabetes mellitus with hyperglycemia: Secondary | ICD-10-CM

## 2024-03-03 MED ORDER — NOVOLOG FLEXPEN 100 UNIT/ML ~~LOC~~ SOPN: 45.0000 [IU] | PEN_INJECTOR | Freq: Three times a day (TID) | SUBCUTANEOUS | 0 refills | Status: DC

## 2024-03-03 MED ORDER — BASAGLAR KWIKPEN 100 UNIT/ML ~~LOC~~ SOPN: 60.0000 [IU] | PEN_INJECTOR | Freq: Every day | SUBCUTANEOUS | 1 refills | Status: DC

## 2024-03-03 NOTE — Telephone Encounter (Signed)
 Copied from CRM [id number]. Topic: General - Other >> Mar 03, 2024  2:18 PM Rosina BIRCH wrote: Reason for CRM: bridget from [employer] called stating she need clarification for the patient novolog  pen [phone]

## 2024-03-03 NOTE — Telephone Encounter (Signed)
 Called [employer] and advised per Dr.Wendling  injection 55 u TID w meals.

## 2024-03-03 NOTE — Telephone Encounter (Signed)
What dose do you want her on?

## 2024-03-03 NOTE — Telephone Encounter (Signed)
 done

## 2024-03-03 NOTE — Progress Notes (Signed)
 03/03/2024 Name: [name] MRN: [id number] DOB: 11-28-1960  Chief Complaint  Patient presents with   Diabetes    [name] is a 63 y.o. year old female who presented for a telephone visit.   They were referred to the pharmacist by their PCP for assistance in managing diabetes and complex medication management.   Subjective:  Care Team: Primary Care Provider: [name], DO ; Next Scheduled Visit: not currently scheduled  Medication Access/Adherence  Current Pharmacy:  [facility] [address] [place] - [address] [place] [address] Phone: [phone] Fax: [phone]   Patient reports affordability concerns with their medications: No    Patient reports access/transportation concerns to their pharmacy: No  - patient's husband helps with transportation Patient reports adherence concerns with their medications:  Yes  - varies insulin  dose     Diabetes:  Current medications:  metformin  ER 500mg  - 2 tablets = 1000mg  twice a day Basaglar  / insulin  glargine - her med list has 30 units but she was instructed to increase by 2 to 3 units  Novolog  insulin  - her med list has 30 units 3 times a day but she has increased to 55 units 3 times a day.   Medications tried in the past:   Current glucose readings: Using Dex Com 7 Continuous Glucose Monitor system Patient report the following from her DexCom report Last 14 days Average Glucose: 222 mg/dL Glucose Management Indicator: 8.6%  Time in Goal:  - Time in range 70-180: 28% - Time very high range: 31% - Time high range: 41% - Time low range: 0% - Time very low range: 0%  Average blood glucose is higher than before but over the last 7 days it has been getter - average blood glucose was 208 and Time in range was 33%       Objective:  Lab Results  Component Value Date   HGBA1C 8.0 (H) 09/10/2023    Lab Results  Component Value Date   CREATININE 1.09  (H) 10/01/2023   BUN 24 (H) 10/01/2023   NA 137 10/01/2023   K 3.5 10/01/2023   CL 92 (L) 10/01/2023   CO2 29 10/01/2023    Lab Results  Component Value Date   CHOL 147 05/08/2023   HDL 39.10 05/08/2023   LDLCALC 79 05/08/2023   LDLDIRECT 121.0 10/27/2020   TRIG 142.0 05/08/2023   CHOLHDL 4 05/08/2023   Lab Results  Component Value Date   WBC 7.6 10/01/2023   HGB 11.8 (L) 10/01/2023   HCT 44.2 10/01/2023   MCV 79.2 (L) 10/01/2023   PLT 349 10/01/2023    Medications Reviewed Today     Reviewed by Carla Milling, RPH-CPP (Pharmacist) on 03/03/24 at 1354  Med List Status: <None>   Medication Order Taking? Sig Documenting Provider Last Dose Status Informant  acetaminophen  (TYLENOL ) 500 MG tablet [id number]  Take 1,000 mg by mouth as needed for mild pain (pain score 1-3). [provider]  Active Self, Pharmacy Records  albuterol  (PROVENTIL ) (2.5 MG/3ML) 0.083% nebulizer solution [id number]  USE 1 VIAL IN NEBULIZER EVERY 4 HOURS AS NEEDED FOR WHEEZING FOR SHORTNESS OF BREATH Wendling, [name], DO  Active Self, Pharmacy Records  albuterol  (VENTOLIN  HFA) 108 [id number] Base) MCG/ACT inhaler [id number]  Inhale 2 puffs into the lungs every 4 (four) hours as needed for wheezing or shortness of breath. [name], DO  Active   apixaban  (ELIQUIS ) 5 MG TABS  tablet [id number]  Take 1 tablet (5 mg total) by mouth 2 (two) times daily. [name], DO  Active   augmented betamethasone  dipropionate (DIPROLENE -AF) 0.05 % cream [id number]  APPLY 1 APPLICATION TOPICALLY TWICE DAILY AS NEEDED (RASH) Wendling, [name], DO  Active   Continuous Glucose Sensor (DEXCOM G7 SENSOR) MISC [id number]  USE TO CHECK FOR BLOOD SUGAR CONTINUOUSLY- CHANGE SENSOR EVERY 10 DAYS [name], DO  Active Self, Pharmacy Records  diclofenac  (FLECTOR ) 1.3 % 1 patch [id number]   [name], DO  Active   diclofenac  (FLECTOR ) 1.3 % PTCH [id number]  PLACE 1 PATCH ONTO THE  SKIN TWICE DAILY - WEAR PATCH UP TO 12 HOURS THEN REMOVE AND REPLACE WITH A NEW PATCH AS NEEDED [name], DO  Active   diclofenac  Sodium (PENNSAID ) 2 % SOLN [id number]  Apply 2 Pump (40 mg total) topically 2 (two) times daily as needed. 1 application to back twice daily [name], DO  Active   diltiazem  (CARDIZEM  CD) 360 MG 24 hr capsule [id number]  Take 1 capsule (360 mg total) by mouth daily. [name], DO  Active   diphenhydrAMINE  (BENADRYL ) 25 mg capsule [id number]  Take 2 capsules (50 mg total) by mouth as needed (for allergic reaction prior to Epi Pen). [name], DO  Active   EPINEPHrine  0.3 mg/0.3 mL IJ SOAJ injection [id number]  Inject 0.3 mg into the muscle as needed for anaphylaxis. [name], DO  Active Self, Pharmacy Records           Med Note [facility]   Mon Oct 08, 2023 12:03 PM) PRN for Anaphylaxis/allergic reaction  ferrous sulfate  (FEROSUL) 325 (65 FE) MG tablet [id number]  Take 1 tablet (325 mg total) by mouth 2 (two) times daily with a meal. [name], DO  Active   fluticasone  (FLONASE ) 50 MCG/ACT nasal spray [id number]  Place 2 sprays into both nostrils daily as needed for allergies or rhinitis. [name], DO  Active   furosemide  (LASIX ) 40 MG tablet [id number]  Take 1 tablet (40 mg total) by mouth daily.  Patient taking differently: Take 40 mg by mouth daily. Takes extra 0.5 tablet = 20mg  at noon if needed for swelling / edema   [name], DO  Active   gabapentin  (NEURONTIN ) 400 MG capsule [id number]  Take 1 capsule (400 mg total) by mouth 4 (four) times daily. TAKE 1 CAPSULE BY MOUTH IN THE MORNING AND 1 AT NOON AND 2 AT BEDTIME Strength: 400 mg Raulkar, Sven SQUIBB, MD  Active Self, Pharmacy Records           Med Note [facility]   Wed Jan 02, 2024  4:01 PM)    gentamicin ointment (GARAMYCIN) 0.1 % [id number]  Apply 1 Application topically in the morning and at  bedtime. [provider]  Active Self, Pharmacy Records  Glucagon , rDNA, (GLUCAGON  EMERGENCY) 1 MG KIT [id number]  Inject 1 mg into the vein daily as needed. [name], DO  Active   insulin  aspart (NOVOLOG  FLEXPEN) 100 UNIT/ML FlexPen [id number] Yes Inject 15-20 Units into the skin 3 (three) times daily before meals  Patient taking differently: 55 Units 3 (three) times daily with meals. Inject 15-20 Units into the skin 3 (three) times daily before meals   Frann, [name], DO  Active   Insulin  Glargine (BASAGLAR  KWIKPEN) 100 UNIT/ML [id number] Yes Inject 30 Units into the skin at bedtime.  Patient taking  differently: Inject 55 Units into the skin at bedtime.   [name], DO  Active   Insulin  Syringe-Needle U-100 (INSULIN  SYRINGE .5CC/30GX5/16) 30G X 5/16 0.5 ML MISC [id number]  Take as directed with Novolog  inuslin.  15-20 units per day  Patient not taking: Reported on 03/03/2024   [name], DO  Active   Insulin  Syringes, Disposable, U-100 0.5 ML MISC [id number]  100 each by Does not apply route 4 (four) times daily -  before meals and at bedtime.  Patient not taking: Reported on 03/03/2024   Caleen Burgess BROCKS, MD  Active   isosorbide  mononitrate (IMDUR ) 60 MG 24 hr tablet [id number]  Take 1 tablet (60 mg total) by mouth daily. Swinyer, Rosaline HERO, NP  Active Self, Pharmacy Records  levocetirizine (XYZAL ) 5 MG tablet [id number]  Take 1 tablet (5 mg total) by mouth every evening. [name], DO  Active   levothyroxine  (SYNTHROID ) 200 MCG tablet [id number]  TAKE 1 TABLET BY MOUTH ONCE DAILY BEFORE BREAKFAST Wendling, [name], DO  Active   lidocaine  (LIDODERM ) 5 % [id number]  Place 1 patch onto the skin every 12 (twelve) hours. Remove & Discard patch within 12 hours or as directed  Patient taking differently: Place 1 patch onto the skin daily as needed (pain). Remove & Discard patch within 12 hours or as directed   Debby Fidela CROME, NP   Active Self, Pharmacy Records  lidocaine  (XYLOCAINE ) 5 % ointment [id number]  Apply 1 Application topically in the morning and at bedtime. [provider]  Active Self, Pharmacy Records  metFORMIN  (GLUCOPHAGE -XR) 500 MG 24 hr tablet [id number]  Take 2 tablets by mouth twice daily Wendling, [name], DO  Active   Multiple Vitamins-Minerals (WOMENS 50+ MULTI VITAMIN PO) [id number]  Take 1 tablet by mouth in the morning and at bedtime. [provider]  Active Self, Pharmacy Records  mupirocin  ointment (BACTROBAN ) 2 % [id number]  Apply topically 2 (two) times daily. [name], DO  Active   naloxone  (NARCAN ) nasal spray 4 mg/0.1 mL [id number]  Place 1 spray into the nose as needed (opoid overdose). [name], DO  Active   nitroGLYCERIN  (NITROSTAT ) 0.4 MG SL tablet [id number]  Place 1 tablet (0.4 mg total) under the tongue every 5 (five) minutes as needed for chest pain. [name], DO  Active   nystatin  (MYCOSTATIN /NYSTOP ) powder [id number]  Apply 1 Application topically as needed. [provider]  Active   omeprazole  (PRILOSEC) 40 MG capsule [id number]  Take 1 capsule (40 mg total) by mouth daily. [name], DO  Active   OneTouch Delica Lancets 33G [place] [id number]  Use to check blood glucose up to 4 times a day [name], DO  Active Self, Pharmacy Records  [facility] VERIO test strip [id number]  Use as instructed [name], DO  Active Self, Pharmacy Records  Oxycodone  HCl 20 MG TABS [id number]  Take 1 tablet (20 mg total) by mouth in the morning, at noon, and at bedtime. [name], DO  Active   Polyethyl Glyc-Propyl Glyc PF (SYSTANE PRESERVATIVE FREE) 0.4-0.3 % SOLN [id number]  Place 2 drops into both eyes in the morning and at bedtime. [provider]  Active Self, Pharmacy Records  pramipexole  (MIRAPEX ) 1 MG tablet [id number]  Take 1 tablet (1 mg total) by mouth 3 (three) times daily.  [name], DO  Active   pregabalin  (LYRICA ) 50 MG capsule [id number]  TAKE 1 CAPSULE BY MOUTH  THREE TIMES DAILY Raulkar, Sven SQUIBB, MD  Active   promethazine  (PHENERGAN ) 25 MG tablet [id number]  Take 1 tablet (25 mg total) by mouth every 8 (eight) hours as needed for vomiting or nausea.  Patient taking differently: Take 25 mg by mouth as needed for vomiting or nausea.   [name], DO  Active Self, Pharmacy Records  spironolactone  (ALDACTONE ) 50 MG tablet [id number]  Take 1 tablet (50 mg total) by mouth daily. [name], DO  Active   Vitamin D , Ergocalciferol , (DRISDOL ) 1.25 MG (50000 UNIT) CAPS capsule [id number]  Take 1 capsule by mouth once a week [name], DO  Active               Assessment/Plan:   Diabetes:Currently uncontrolled per last A1c over the last 7 days blood glucose improved  - Reviewed goal A1c, goal fasting, and goal 2 hour post prandial glucose - Recommend to increase Basaglar  to 60 units once day. (May need to change to Toujeo  max or Tresiba U200 in the future).   - Recommended she continue Novolog  55 units 3 times day - updated Rx at pharmacy. Educaiton provided regarding difference between long and short acting insulin . Patient might find that as we determine the right long actigin insulin  dose for her that we will need to lower dose of Novolog .  - Answered patient's questions about insulin  pumps. She is considering.    Follow Up Plan: 2 to 4 weeks  Madelin Ray, PharmD Clinical Pharmacist [facility]

## 2024-03-07 ENCOUNTER — Telehealth: Payer: Self-pay | Admitting: Family Medicine

## 2024-03-07 ENCOUNTER — Other Ambulatory Visit: Payer: Self-pay | Admitting: Family Medicine

## 2024-03-07 NOTE — Telephone Encounter (Signed)
 Copied from CRM [id number]. Topic: Clinical - Medical Advice >> Mar 07, 2024 10:00 AM Shereese L wrote: Reason for CRM: patient needs no speak with the doctor or nurse in reference to the medication listed below insulin  aspart (NOVOLOG  FLEXPEN) 100 UNIT/ML FlexPen >> Mar 07, 2024 10:19 AM Mercedes MATSU wrote: Patient called back requesting to speak with Dr. Sherial nurse about her novolog  pen. She states she's using a lot and that the quantity needs to be adjusted. Patient is requesting a call back and can be reached at [phone], she also states that the pharmacy is waiting for a prior authorization from the clinic to be completed as well.

## 2024-03-10 ENCOUNTER — Other Ambulatory Visit: Payer: Self-pay

## 2024-03-10 ENCOUNTER — Other Ambulatory Visit: Payer: Self-pay | Admitting: Pharmacist

## 2024-03-10 ENCOUNTER — Other Ambulatory Visit (HOSPITAL_COMMUNITY): Payer: Self-pay

## 2024-03-10 ENCOUNTER — Telehealth: Payer: Self-pay

## 2024-03-10 DIAGNOSIS — E1165 Type 2 diabetes mellitus with hyperglycemia: Secondary | ICD-10-CM

## 2024-03-10 MED ORDER — NOVOLOG FLEXPEN 100 UNIT/ML ~~LOC~~ SOPN: 45.0000 [IU] | PEN_INJECTOR | Freq: Three times a day (TID) | SUBCUTANEOUS | 2 refills | Status: DC

## 2024-03-10 NOTE — Telephone Encounter (Signed)
 Sent pt message letting her know Novolog  Flexpen has been APPROVED from 03/10/24

## 2024-03-10 NOTE — Telephone Encounter (Signed)
 Patient has having difficulty getting Novolog  insulin  - needed prior authorization. It looks like prior authorization was approved this morning.  Patient also mentions that her DexCom G7 sensor fell off before 10 days. Recommended that she call [employer] to report it and request a replacement. She states she has done this in the past and [employer] seems reluctant to provide replacments but she might try this. She does not have ay sensors an dis not sure to refill again until around 03/30/2024 (her last RF for DexCom G7 sensor was 01/01/2024 for 90 day supply). She might be able to have fill about 7 days early around 7/14 but she will be have to go about 2 weeks without sensor.  Our office did not currently have DexCom G7 samples but I have request and will provide to patient if arrives before 7/14.

## 2024-03-10 NOTE — Telephone Encounter (Signed)
 Pharmacy Patient Advocate Encounter   Received notification from Pt Calls Messages that prior authorization for Insulin  Aspart Flexpen (Novolog ) is required/requested.   Insurance verification completed.   The patient is insured through [employer] .   Per test claim: PA required; PA submitted to above mentioned insurance via CoverMyMeds Key/confirmation #/EOC [id number] Status is pending

## 2024-03-10 NOTE — Telephone Encounter (Signed)
 Pharmacy Patient Advocate Encounter  Received notification from [facility] [employer] that Prior Authorization for Novolog  Flexpen has been APPROVED from 03/10/24 to 09/10/98   PA #/Case ID/Reference #: [id number]  Placed a call to [employer] and per the [facility]), they received a paid claim for the order.

## 2024-03-10 NOTE — Telephone Encounter (Signed)
 Called pt and she stated need PA, request sent to PA team.

## 2024-03-11 ENCOUNTER — Other Ambulatory Visit: Payer: Self-pay

## 2024-03-11 NOTE — Patient Instructions (Signed)
 Visit Information  Thank you for taking time to visit with me today. Please don't hesitate to contact me if I can be of assistance to you before our next scheduled appointment.  Our next appointment is by telephone on 03/26/24 at 2:00 Please call the care guide team at [phone] if you need to cancel or reschedule your appointment.   Following is a copy of your care plan:   Goals Addressed             This Visit's Progress    VBCI RN Care Plan   Improving    Problems:  Chronic Disease Management support and education needs related to CHF, DMII, HTN, and Skin breakdown  Goal: Over the next 3 months the Patient will attend all scheduled medical appointments:  with Wound clinic 03/20/24  as evidenced by chart review and patient report        continue to work with RN Care Manager and/or Social Worker to address care management and care coordination needs related to DMII and HTN as evidenced by adherence to CM Team Scheduled appointments     demonstrate Ongoing adherence to prescribed treatment plan for DMII as evidenced by proving fasting and post prandial blood sugar readings keeping with goal range of fasting <130, post prandial <180, and working with [facility] PT to meet goal of improved mobility, able to transfer from bed to chair with assistance.  Interventions:   Diabetes Interventions: Assessed patient's understanding of A1c goal: <6.5% Provided education to patient about basic DM disease process Reviewed medications with patient and discussed importance of medication adherence Counseled on importance of regular laboratory monitoring as prescribed Discussed plans with patient for ongoing care management follow up and provided patient with direct contact information for care management team Advised patient, providing education and rationale, to check cbg using CBG montior and record, calling PCP for findings outside established parameters Encouraged patient to continue to work with  Home Health PT to reach her goal of being able to transfer from bed to wheel chair, which will enable her to return to in person medical visits. Discussed starting on insulin  pump, in discussion with pharmacist and plans to see endocrinology in the fall. Lab Results  Component Value Date   HGBA1C 8.0 (H) 09/10/2023    Patient [name] Activities:  Attend all scheduled provider appointments Call pharmacy for medication refills 3-7 days in advance of running out of medications Call provider office for new concerns or questions  Perform all self care activities independently  Take medications as prescribed   Work with the pharmacist to address medication management needs and will continue to work with the clinical team to address health care and disease management related needs Patient to schedule annual diabetic eye exam Patient to schedule 6 month dental exam  Plan:  Telephone follow up appointment with care management team member scheduled for:  03/26/24 at 2:00             Please call the Suicide and Crisis Lifeline: 988 call the [place]  National Suicide Prevention Lifeline: 306-746-3122 or TTY: 703-729-7446 TTY 510-193-8547) to talk to a trained counselor call 1-800-273-TALK (toll free, 24 hour hotline) if you are experiencing a Mental Health or Behavioral Health Crisis or need someone to talk to.  Patient verbalizes understanding of instructions and care plan provided today and agrees to view in MyChart. Active MyChart status and patient understanding of how to access instructions and care plan via MyChart confirmed with patient.     SIGNATURE.  Olam Idol BSN RN CCM [place]  [facility], [facility] RN Care Manager Direct Dial: [phone] Fax: [phone]

## 2024-03-11 NOTE — Patient Outreach (Signed)
 Complex Care Management   Visit Note  03/11/2024  Name:  [name] MRN: [id number] DOB: 06-Dec-1960  Situation: Referral received for Complex Care Management related to Heart Failure, Diabetes with Complications, and HTN I obtained verbal consent from Patient.  Visit completed with patient  on the phone  Background:   Past Medical History:  Diagnosis Date   Aortic stenosis    mild AS by echo 02/2021   Asthma    on daily RX and rescue inhaler (04/18/2018)   Chronic diastolic CHF (congestive heart failure) (HCC) 09/2015   Chronic lower back pain    Chronic neck pain    Chronic pain syndrome    Fentanyl  Patch   Colon polyps    Coronary artery disease    cath with normal LM, 30% LAD, 85% mid RCA and 95% distal RCA s/p PCI of the mid to distal RCA and now on DAPT with ASA and Ticagrelor .     Eczema    Excessive daytime sleepiness 11/26/2015   Fibromyalgia    Gallstones    GERD (gastroesophageal reflux disease)    Heart murmur    noted for the 1st time on 04/18/2018   History of blood transfusion 07/2010   S/P oophorectomy   History of gout    History of hiatal hernia 1980s   gone now (04/18/2018)   Hyperlipidemia    Hypertension    takes Metoprolol  and Enalapril  daily   Hypothyroidism    takes Synthroid  daily   IBS (irritable bowel syndrome)    Migraine    nothing in the 2000s (04/18/2018)   Mixed connective tissue disease (HCC)    NAFLD (nonalcoholic fatty liver disease)    Pneumonia    several times (04/18/2018)   PVC's (premature ventricular contractions)    noted on event monitor 02/2021   Rheumatoid arthritis (HCC)    hands, elbows, shoulders, probably knees (04/18/2018)   Scoliosis    Sjogren's syndrome (HCC)    Sleep apnea    mild - does not use cpap   Spondylosis    Type II diabetes mellitus (HCC)    takes Metformin  and Hum R daily (04/18/2018)   Walker as ambulation aid    also uses wheelchair    Assessment: Patient Reported Symptoms:  Cognitive  Cognitive Status: Alert and oriented to person, place, and time      Neurological      HEENT        Cardiovascular Cardiovascular Symptoms Reported: Swelling in legs or feet (minor swelling to day to LE, taking extra. .5 tablet of lasix ) Does patient have uncontrolled Hypertension?: Yes Is patient checking Blood Pressure at home?: Yes Patient's Recent BP reading at home: 118/70 Cardiovascular Management Strategies: Routine screening, Medication therapy, Weight management, Fluid modification  Respiratory      Endocrine Endocrine Symptoms Reported: No symptoms reported Is patient diabetic?: Yes Is patient checking blood sugars at home?: Yes List most recent blood sugar readings, include date and time of day: 03/11/24 fasting 113, highest 224 Endocrine Self-Management Outcome: 3 (uncertain) Endocrine Comment: patient states has no more Dexcom senors, due to failure to adhere, cannot get refill until end of July.  Message sent to pharmacy for possible options.  Gastrointestinal Gastrointestinal Symptoms Reported: No symptoms reported      Genitourinary Genitourinary Symptoms Reported: No symptoms reported Genitourinary Management Strategies: Catheter, indwelling  Integumentary Integumentary Symptoms Reported: Wound Additional Integumentary Details: PU bottom of left foot is improving, continues with HH RN 1x weekly, has upcoming  wound clinic visit scheduled. Skin Management Strategies: Medication therapy, Routine screening, Dressing changes, Adequate rest, Diet modification  Musculoskeletal Musculoskelatal Symptoms Reviewed: Weakness, Limited mobility Other Musculoskeletal Symptoms: patient continues to work with [facility] PT on bed to chair to car transfers to allow for in person medical visits. Musculoskeletal Management Strategies: Adequate rest, Exercise, Routine screening Falls in the past year?: No    Psychosocial Psychosocial Symptoms Reported: No symptoms reported             01/01/2024    3:50 PM  Depression screen PHQ 2/9  Decreased Interest 0  Down, Depressed, Hopeless 0  PHQ - 2 Score 0    Vitals:   03/11/24 1419  BP: 118/70    Medications Reviewed Today     Reviewed by Lonzell Planas, RN (Registered Nurse) on 03/11/24 at 1423  Med List Status: <None>   Medication Order Taking? Sig Documenting Provider Last Dose Status Informant  acetaminophen  (TYLENOL ) 500 MG tablet [id number]  Take 1,000 mg by mouth as needed for mild pain (pain score 1-3). [provider]  Active Self, Pharmacy Records  albuterol  (PROVENTIL ) (2.5 MG/3ML) 0.083% nebulizer solution [id number]  USE 1 VIAL IN NEBULIZER EVERY 4 HOURS AS NEEDED FOR WHEEZING FOR SHORTNESS OF BREATH Wendling, [name], DO  Active Self, Pharmacy Records  albuterol  (VENTOLIN  HFA) [phone] Base) MCG/ACT inhaler [id number]  Inhale 2 puffs into the lungs every 4 (four) hours as needed for wheezing or shortness of breath. [name], DO  Active   apixaban  (ELIQUIS ) 5 MG TABS tablet [id number]  Take 1 tablet (5 mg total) by mouth 2 (two) times daily. [name], DO  Active   augmented betamethasone  dipropionate (DIPROLENE -AF) 0.05 % cream [id number]  APPLY 1 APPLICATION TOPICALLY TWICE DAILY AS NEEDED (RASH) Wendling, [name], DO  Active   Continuous Glucose Sensor (DEXCOM G7 SENSOR) [place] [id number]  USE TO CHECK FOR BLOOD SUGAR CONTINUOUSLY- CHANGE SENSOR EVERY 10 DAYS Wendling, [name], DO  Active Self, Pharmacy Records  diclofenac  (FLECTOR ) 1.3 % 1 patch [id number]   [name], DO  Active   diclofenac  (FLECTOR ) 1.3 % PTCH [id number]  PLACE 1 PATCH ONTO THE SKIN TWICE DAILY - WEAR PATCH UP TO 12 HOURS THEN REMOVE AND REPLACE WITH A NEW PATCH AS NEEDED Frann, [name], DO  Active   diclofenac  Sodium (PENNSAID ) 2 % SOLN [id number]  Apply 2 Pump (40 mg total) topically 2 (two) times daily as needed. 1 application to back twice daily [name], DO   Active   diltiazem  (CARDIZEM  CD) 360 MG 24 hr capsule [id number]  Take 1 capsule (360 mg total) by mouth daily. [name], DO  Active   diphenhydrAMINE  (BENADRYL ) 25 mg capsule [id number]  Take 2 capsules (50 mg total) by mouth as needed (for allergic reaction prior to Epi Pen). [name], DO  Active   EPINEPHrine  0.3 mg/0.3 mL IJ SOAJ injection [id number]  Inject 0.3 mg into the muscle as needed for anaphylaxis. [name], DO  Active Self, Pharmacy Records           Med Note CARMIN SIN   Mon Oct 08, 2023 12:03 PM) PRN for Anaphylaxis/allergic reaction  ferrous sulfate  (FEROSUL) 325 (65 FE) MG tablet [id number]  Take 1 tablet (325 mg total) by mouth 2 (two) times daily with a meal. [name], DO  Active   fluticasone  (FLONASE ) 50 MCG/ACT nasal spray [id number]  Place 2 sprays into both nostrils  daily as needed for allergies or rhinitis. [name], DO  Active   furosemide  (LASIX ) 40 MG tablet [id number] Yes Take 1 tablet (40 mg total) by mouth daily. [name], DO  Active   gabapentin  (NEURONTIN ) 400 MG capsule [id number]  Take 1 capsule (400 mg total) by mouth 4 (four) times daily. TAKE 1 CAPSULE BY MOUTH IN THE MORNING AND 1 AT NOON AND 2 AT BEDTIME Strength: 400 mg Raulkar, Sven SQUIBB, MD  Active Self, Pharmacy Records           Med Note [facility], [facility]   Wed Jan 02, 2024  4:01 PM)    gentamicin ointment (GARAMYCIN) 0.1 % [id number]  Apply 1 Application topically in the morning and at bedtime. [provider]  Active Self, Pharmacy Records  Glucagon , rDNA, (GLUCAGON  EMERGENCY) 1 MG KIT [id number]  Inject 1 mg into the vein daily as needed. [name], DO  Active   insulin  aspart (NOVOLOG  FLEXPEN) 100 UNIT/ML FlexPen [id number] Yes Inject 45-55 Units into the skin 3 (three) times daily with meals.  Patient taking differently: Inject 55 Units into the skin 3 (three) times daily with meals.    [name], DO  Active   Insulin  Glargine (BASAGLAR  KWIKPEN) 100 UNIT/ML [id number] Yes Inject 60 Units into the skin at bedtime. [name], DO  Active   Insulin  Syringe-Needle U-100 (INSULIN  SYRINGE .5CC/30GX5/16) 30G X 5/16 0.5 ML MISC [id number]  Take as directed with Novolog  inuslin.  15-20 units per day  Patient not taking: Reported on 03/03/2024   [name], DO  Active   Insulin  Syringes, Disposable, U-100 0.5 ML MISC [id number]  100 each by Does not apply route 4 (four) times daily -  before meals and at bedtime.  Patient not taking: Reported on 03/03/2024   Caleen Burgess BROCKS, MD  Active   isosorbide  mononitrate (IMDUR ) 60 MG 24 hr tablet [id number]  Take 1 tablet (60 mg total) by mouth daily. Swinyer, Rosaline HERO, NP  Active Self, Pharmacy Records  levocetirizine (XYZAL ) 5 MG tablet [id number]  Take 1 tablet (5 mg total) by mouth every evening. [name], DO  Active   levothyroxine  (SYNTHROID ) 200 MCG tablet [id number]  TAKE 1 TABLET BY MOUTH ONCE DAILY BEFORE BREAKFAST Wendling, [name], DO  Active   lidocaine  (LIDODERM ) 5 % [id number]  Place 1 patch onto the skin every 12 (twelve) hours. Remove & Discard patch within 12 hours or as directed  Patient taking differently: Place 1 patch onto the skin daily as needed (pain). Remove & Discard patch within 12 hours or as directed   Debby Fidela CROME, NP  Active Self, Pharmacy Records  lidocaine  (XYLOCAINE ) 5 % ointment [id number]  Apply 1 Application topically in the morning and at bedtime. [provider]  Active Self, Pharmacy Records  metFORMIN  (GLUCOPHAGE -XR) 500 MG 24 hr tablet [id number]  Take 2 tablets by mouth twice daily Wendling, [name], DO  Active   Multiple Vitamins-Minerals (WOMENS 50+ MULTI VITAMIN PO) [id number]  Take 1 tablet by mouth in the morning and at bedtime. [provider]  Active Self, Pharmacy Records  mupirocin  ointment (BACTROBAN ) 2 % [id number]   Apply topically 2 (two) times daily. [name], DO  Active   naloxone  (NARCAN ) nasal spray 4 mg/0.1 mL [id number]  Place 1 spray into the nose as needed (opoid overdose). [name], DO  Active   nitroGLYCERIN  (NITROSTAT ) 0.4 MG SL tablet [id number]  Place  1 tablet (0.4 mg total) under the tongue every 5 (five) minutes as needed for chest pain. [name], DO  Active   nystatin  (MYCOSTATIN /NYSTOP ) powder [id number]  Apply 1 Application topically as needed. [provider]  Active   omeprazole  (PRILOSEC) 40 MG capsule [id number]  Take 1 capsule (40 mg total) by mouth daily. [name], DO  Active   OneTouch Delica Lancets 33G [place] [id number]  Use to check blood glucose up to 4 times a day [name], DO  Active Self, Pharmacy Records  [facility] VERIO test strip [id number]  Use as instructed [name], DO  Active Self, Pharmacy Records  Oxycodone  HCl 20 MG TABS [id number]  Take 1 tablet (20 mg total) by mouth in the morning, at noon, and at bedtime. [name], DO  Active   Polyethyl Glyc-Propyl Glyc PF (SYSTANE PRESERVATIVE FREE) 0.4-0.3 % SOLN [id number]  Place 2 drops into both eyes in the morning and at bedtime. [provider]  Active Self, Pharmacy Records  pramipexole  (MIRAPEX ) 1 MG tablet [id number]  Take 1 tablet (1 mg total) by mouth 3 (three) times daily. [name], DO  Active   pregabalin  (LYRICA ) 50 MG capsule [id number]  TAKE 1 CAPSULE BY MOUTH THREE TIMES DAILY Raulkar, Sven SQUIBB, MD  Active   promethazine  (PHENERGAN ) 25 MG tablet [id number]  Take 1 tablet (25 mg total) by mouth every 8 (eight) hours as needed for vomiting or nausea.  Patient taking differently: Take 25 mg by mouth as needed for vomiting or nausea.   [name], DO  Active Self, Pharmacy Records  spironolactone  (ALDACTONE ) 50 MG tablet [id number]  Take 1 tablet (50 mg total) by mouth daily. [name], DO  Active   Vitamin D , Ergocalciferol , (DRISDOL ) 1.25 MG (50000 UNIT) CAPS capsule [id number]  Take 1 capsule by mouth once a week [name], DO  Active             Recommendation:   Continue Current Plan of Care Message to pharmacist regarding need for additional Dexcom sensors  Follow Up Plan:   Telephone follow up appointment date/time:  03/26/24 at 2:00  SIG  Olam Idol BSN RN CCM [place]  [facility], [facility] RN Care Manager Direct Dial: [phone] Fax: [phone]

## 2024-03-17 ENCOUNTER — Encounter: Payer: Self-pay | Admitting: Family Medicine

## 2024-03-17 ENCOUNTER — Other Ambulatory Visit: Payer: Self-pay | Admitting: Family Medicine

## 2024-03-18 ENCOUNTER — Other Ambulatory Visit: Payer: Self-pay

## 2024-03-18 ENCOUNTER — Other Ambulatory Visit: Payer: Self-pay | Admitting: Family Medicine

## 2024-03-18 MED ORDER — ALBUTEROL SULFATE HFA 108 (90 BASE) MCG/ACT IN AERS: 2.0000 | INHALATION_SPRAY | RESPIRATORY_TRACT | 1 refills | Status: DC | PRN

## 2024-03-18 MED ORDER — OXYCODONE HCL 20 MG PO TABS: 20.0000 mg | ORAL_TABLET | Freq: Three times a day (TID) | ORAL | 0 refills | Status: DC

## 2024-03-20 ENCOUNTER — Ambulatory Visit (HOSPITAL_BASED_OUTPATIENT_CLINIC_OR_DEPARTMENT_OTHER): Admitting: General Surgery

## 2024-03-20 ENCOUNTER — Other Ambulatory Visit: Payer: Self-pay | Admitting: Physical Medicine and Rehabilitation

## 2024-03-26 ENCOUNTER — Other Ambulatory Visit: Payer: Self-pay | Admitting: Family Medicine

## 2024-03-26 ENCOUNTER — Other Ambulatory Visit: Payer: Self-pay

## 2024-03-26 DIAGNOSIS — E1165 Type 2 diabetes mellitus with hyperglycemia: Secondary | ICD-10-CM

## 2024-03-26 NOTE — Patient Instructions (Signed)
 Visit Information  Thank you for taking time to visit with me today. Please don't hesitate to contact me if I can be of assistance to you before our next scheduled appointment.  Our next appointment is by telephone on 04/25/24 at 2:00 Please call the care guide team at [phone] if you need to cancel or reschedule your appointment.   Following is a copy of your care plan:   Goals Addressed             This Visit's Progress    VBCI RN Care Plan   On track    Problems:  Chronic Disease Management support and education needs related to CHF, DMII, HTN, and Skin breakdown  Goal: Over the next 3 months the Patient will attend all scheduled medical appointments:  with Wound clinic 098/12/25 [facility], 04/23/24 PCP, 11/10 Endo with Dr Elsie Sharps at [facility]  as evidenced by chart review and patient report        continue to work with RN Care Manager and/or Social Worker to address care management and care coordination needs related to DMII and HTN as evidenced by adherence to CM Team Scheduled appointments     demonstrate Ongoing adherence to prescribed treatment plan for DMII as evidenced by proving fasting and post prandial blood sugar readings keeping with goal range of fasting <130, post prandial <180, and working with [facility] PT to meet goal of improved mobility, able to transfer from bed to chair with assistance. Patient has had consistent improvement in fasting and pp blood sugars, and blood pressure has been WNL.  Patient is currently able to stand for 15 second intervals, continues to work towards bed to w/c transfers with [facility] PT weekly visits.  Patient plans to work with PT to be able to transfer to bedside commode, so that she can have foley removed.  Interventions:   Diabetes Interventions: Assessed patient's understanding of A1c goal: <6.5% Provided education to patient about basic DM disease process Reviewed medications with patient and discussed importance of medication  adherence Counseled on importance of regular laboratory monitoring as prescribed Discussed plans with patient for ongoing care management follow up and provided patient with direct contact information for care management team Advised patient, providing education and rationale, to check cbg using CBG montior and record, calling PCP for findings outside established parameters Encouraged patient to continue to work with Home Health PT to reach her goal of being able to transfer from bed to wheel chair, which will enable her to return to in person medical visits. Discussed starting on insulin  pump, in discussion with pharmacist and plans to see endocrinology in the fall. Lab Results  Component Value Date   HGBA1C 8.0 (H) 09/10/2023    Patient [name] Activities:  Attend all scheduled provider appointments Call pharmacy for medication refills 3-7 days in advance of running out of medications Call provider office for new concerns or questions  Perform all self care activities independently  Take medications as prescribed   Work with the pharmacist to address medication management needs and will continue to work with the clinical team to address health care and disease management related needs Patient to schedule annual diabetic eye exam Patient to schedule 6 month dental exam  Plan:  Telephone follow up appointment with care management team member scheduled for:  04/24/24 at 2:00          VBCI Social Work Care Plan   On track    Problems:   Disease Management support  and education needs related to Depression: depressed mood  CSW Clinical Goal(s):   Over the next 3 days the Patient will demonstrate a reduction in symptoms related to Depression: depressed mood .  Interventions:  Mental Health:  Evaluation of current treatment plan related to Depression: depressed mood Active listening / Reflection utilized Emotional Support Provided Mindfulness or Relaxation training  provided  Patient Goals/[name] Activities:  Increase coping skills  Plan:   Telephone follow up appointment with care management team member scheduled for:  01/29/2024        Please call the Suicide and Crisis Lifeline: 988 call the [place]  National Suicide Prevention Lifeline: 681-268-8306 or TTY: 614-242-7109 TTY 6701385783) to talk to a trained counselor call 1-800-273-TALK (toll free, 24 hour hotline) if you are experiencing a Mental Health or Behavioral Health Crisis or need someone to talk to.  Patient verbalizes understanding of instructions and care plan provided today and agrees to view in MyChart. Active MyChart status and patient understanding of how to access instructions and care plan via MyChart confirmed with patient.     SIGNATURE  Olam Idol BSN RN CCM [place]  [facility], [facility] Health RN Care Manager Direct Dial: [phone] Fax: [phone]

## 2024-03-26 NOTE — Patient Outreach (Signed)
 Complex Care Management   Visit Note  03/26/2024  Name:  MARVENE STROHM MRN: [id number] DOB: 01/07/1961  Situation: Referral received for Complex Care Management related to Heart Failure and Diabetes with Complications I obtained verbal consent from Patient.  Visit completed with patient  on the phone  Background:   Past Medical History:  Diagnosis Date   Aortic stenosis    mild AS by echo 02/2021   Asthma    on daily RX and rescue inhaler (04/18/2018)   Chronic diastolic CHF (congestive heart failure) (HCC) 09/2015   Chronic lower back pain    Chronic neck pain    Chronic pain syndrome    Fentanyl  Patch   Colon polyps    Coronary artery disease    cath with normal LM, 30% LAD, 85% mid RCA and 95% distal RCA s/p PCI of the mid to distal RCA and now on DAPT with ASA and Ticagrelor .     Eczema    Excessive daytime sleepiness 11/26/2015   Fibromyalgia    Gallstones    GERD (gastroesophageal reflux disease)    Heart murmur    noted for the 1st time on 04/18/2018   History of blood transfusion 07/2010   S/P oophorectomy   History of gout    History of hiatal hernia 1980s   gone now (04/18/2018)   Hyperlipidemia    Hypertension    takes Metoprolol  and Enalapril  daily   Hypothyroidism    takes Synthroid  daily   IBS (irritable bowel syndrome)    Migraine    nothing in the 2000s (04/18/2018)   Mixed connective tissue disease (HCC)    NAFLD (nonalcoholic fatty liver disease)    Pneumonia    several times (04/18/2018)   PVC's (premature ventricular contractions)    noted on event monitor 02/2021   Rheumatoid arthritis (HCC)    hands, elbows, shoulders, probably knees (04/18/2018)   Scoliosis    Sjogren's syndrome (HCC)    Sleep apnea    mild - does not use cpap   Spondylosis    Type II diabetes mellitus (HCC)    takes Metformin  and Hum R daily (04/18/2018)   Walker as ambulation aid    also uses wheelchair    Assessment: Patient Reported Symptoms:  Cognitive  Cognitive Status: Alert and oriented to person, place, and time      Neurological Neurological Review of Symptoms: Dizziness, Numbness Neurological Management Strategies: Medication therapy, Routine screening Neurological Comment: Patient needs new neuro referral, will discuss at PCP visit on 8/13  HEENT HEENT Symptoms Reported: Frequent sneezing, Nasal discharge HEENT Management Strategies: Medication therapy, Routine screening    Cardiovascular Cardiovascular Symptoms Reported: Swelling in legs or feet Does patient have uncontrolled Hypertension?: Yes Is patient checking Blood Pressure at home?: Yes Patient's Recent BP reading at home: 128/78    Respiratory Respiratory Symptoms Reported: Wheezing, Shortness of breath, Chest tightness, Productive cough Additional Respiratory Details: patient needs referral to new pulmonologist  will discuss at PCP visit on 04/23/24 Respiratory Management Strategies: Routine screening, Medication therapy  Endocrine Endocrine Symptoms Reported: No symptoms reported Is patient diabetic?: Yes Is patient checking blood sugars at home?: Yes List most recent blood sugar readings, include date and time of day: 03/26/24 fasting 109 pp 140 Endocrine Comment: patient doing manual bs testing until can get new sensor 04/01/24  Gastrointestinal Gastrointestinal Symptoms Reported: Reflux/heartburn, Constipation Additional Gastrointestinal Details: patient will request referral to GI at PCP visit 04/23/24 also need colonoscopy screening Gastrointestinal Management Strategies: Medication  therapy, Fluid modification    Genitourinary Other Genitourinary Symptoms: patient with foley catheter plans to work towards using bedside commode so foley can be removed Genitourinary Management Strategies: Medication therapy, Medical device  Integumentary Additional Integumentary Details: PU left foot continues to improve in depth and diameter, patient reports new small wound to genital  area from catheter Skin Management Strategies: Medical device, Medication therapy Skin Comment: wound care clinical appointment scheduled 04/22/24  Musculoskeletal Other Musculoskeletal Symptoms: patient working with [facility] PT, able to stand for 15 second intervals with PT assistance.        Psychosocial  No symptoms reported            01/01/2024    3:50 PM  Depression screen PHQ 2/9  Decreased Interest 0  Down, Depressed, Hopeless 0  PHQ - 2 Score 0    Vitals:   03/26/24 1413  BP: 128/78    Medications Reviewed Today     Reviewed by Lonzell Planas, RN (Registered Nurse) on 03/26/24 at 1406  Med List Status: <None>   Medication Order Taking? Sig Documenting Provider Last Dose Status Informant  acetaminophen  (TYLENOL ) 500 MG tablet [id number] No Take 1,000 mg by mouth as needed for mild pain (pain score 1-3). [provider] Taking Active Self, Pharmacy Records  albuterol  (PROVENTIL ) (2.5 MG/3ML) 0.083% nebulizer solution [id number] No USE 1 VIAL IN NEBULIZER EVERY 4 HOURS AS NEEDED FOR WHEEZING FOR SHORTNESS OF BREATH Wendling, [name], DO Taking Active Self, Pharmacy Records  albuterol  (VENTOLIN  HFA) 108 [phone] Base) MCG/ACT inhaler [id number]  Inhale 2 puffs into the lungs every 4 (four) hours as needed for wheezing or shortness of breath. [name], DO  Active   apixaban  (ELIQUIS ) 5 MG TABS tablet [id number] No Take 1 tablet (5 mg total) by mouth 2 (two) times daily. [name], DO Taking Active   augmented betamethasone  dipropionate (DIPROLENE -AF) 0.05 % cream [id number] No APPLY 1 APPLICATION TOPICALLY TWICE DAILY AS NEEDED (RASH) [name], DO Taking Active   Continuous Glucose Sensor (DEXCOM G7 SENSOR) MISC [id number]  USE TO CHECK FOR BLOOD SUGAR CONTINUOUSLY- CHANGE SENSOR EVERY 10 DAYS [name], DO  Active   diclofenac  (FLECTOR ) 1.3 % 1 patch [id number]   [name], DO  Active   diclofenac  (FLECTOR )  1.3 % PTCH [id number]  PLACE 1 PATCH ONTO THE SKIN TWICE DAILY - WEAR PATCH UP TO 12 HOURS THEN REMOVE AND REPLACE WITH A NEW PATCH AS NEEDED [name], DO  Active   diclofenac  Sodium (PENNSAID ) 2 % SOLN [id number] No Apply 2 Pump (40 mg total) topically 2 (two) times daily as needed. 1 application to back twice daily [name], DO Taking Active   diltiazem  (CARDIZEM  CD) 360 MG 24 hr capsule [id number] No Take 1 capsule (360 mg total) by mouth daily. [name], DO Taking Active   diphenhydrAMINE  (BENADRYL ) 25 mg capsule [id number] No Take 2 capsules (50 mg total) by mouth as needed (for allergic reaction prior to Epi Pen). [name], DO Taking Active   EPINEPHrine  0.3 mg/0.3 mL IJ SOAJ injection [id number] No Inject 0.3 mg into the muscle as needed for anaphylaxis. [name], DO Taking Active Self, Pharmacy Records           Med Note CARMIN SIN   Mon Oct 08, 2023 12:03 PM) PRN for Anaphylaxis/allergic reaction  ferrous sulfate  (FEROSUL) 325 (65 FE) MG tablet [id number] No Take 1 tablet (325 mg total) by  mouth 2 (two) times daily with a meal. [name], DO Taking Active   fluticasone  (FLONASE ) 50 MCG/ACT nasal spray [id number] No Place 2 sprays into both nostrils daily as needed for allergies or rhinitis. [name], DO Taking Active   furosemide  (LASIX ) 40 MG tablet [id number]  Take 1 tablet (40 mg total) by mouth daily. [name], DO  Active   gabapentin  (NEURONTIN ) 400 MG capsule [id number] No Take 1 capsule (400 mg total) by mouth 4 (four) times daily. TAKE 1 CAPSULE BY MOUTH IN THE MORNING AND 1 AT NOON AND 2 AT BEDTIME Strength: 400 mg Raulkar, Sven SQUIBB, MD Taking Active Self, Pharmacy Records           Med Note [facility]   Wed Jan 02, 2024  4:01 PM)    gentamicin ointment (GARAMYCIN) 0.1 % [id number] No Apply 1 Application topically in the morning and at bedtime. [provider] Taking Active Self, Pharmacy Records  Glucagon , rDNA, (GLUCAGON  EMERGENCY) 1 MG KIT [id number] No Inject 1 mg into the vein daily as needed. [name], DO Taking Active   insulin  aspart (NOVOLOG  FLEXPEN) 100 UNIT/ML FlexPen [id number]  Inject 55 Units into the skin 3 (three) times daily with meals. [name], DO  Active   Insulin  Glargine (BASAGLAR  KWIKPEN) 100 UNIT/ML [id number]  Inject 60 Units into the skin at bedtime. [name], DO  Active   Insulin  Syringe-Needle U-100 (INSULIN  SYRINGE .5CC/30GX5/16) 30G X 5/16 0.5 ML MISC [id number]  Take as directed with Novolog  inuslin.  15-20 units per day  Patient not taking: Reported on 03/03/2024   [name], DO  Active   Insulin  Syringes, Disposable, U-100 0.5 ML MISC [id number]  100 each by Does not apply route 4 (four) times daily -  before meals and at bedtime.  Patient not taking: Reported on 03/03/2024   Caleen Burgess BROCKS, MD  Active   isosorbide  mononitrate (IMDUR ) 60 MG 24 hr tablet [id number] No Take 1 tablet (60 mg total) by mouth daily. Swinyer, Rosaline HERO, NP Taking Active Self, Pharmacy Records  levocetirizine (XYZAL ) 5 MG tablet [id number] No Take 1 tablet (5 mg total) by mouth every evening. [name], DO Taking Active   levothyroxine  (SYNTHROID ) 200 MCG tablet [id number]  TAKE 1 TABLET BY MOUTH ONCE DAILY BEFORE BREAKFAST [name], DO  Active   lidocaine  (LIDODERM ) 5 % [id number] No Place 1 patch onto the skin every 12 (twelve) hours. Remove & Discard patch within 12 hours or as directed  Patient taking differently: Place 1 patch onto the skin daily as needed (pain). Remove & Discard patch within 12 hours or as directed   Debby Fidela CROME, NP Taking Active Self, Pharmacy Records  lidocaine  (XYLOCAINE ) 5 % ointment [id number] No Apply 1 Application topically in the morning and at bedtime. [provider] Taking Active Self, Pharmacy Records   metFORMIN  (GLUCOPHAGE -XR) 500 MG 24 hr tablet [id number] No Take 2 tablets by mouth twice daily Wendling, [name], DO Taking Active   Multiple Vitamins-Minerals (WOMENS 50+ MULTI VITAMIN PO) [id number] No Take 1 tablet by mouth in the morning and at bedtime. [provider] Taking Active Self, Pharmacy Records  mupirocin  ointment (BACTROBAN ) 2 % [id number] No Apply topically 2 (two) times daily. [name], DO Taking Active   naloxone  (NARCAN ) nasal spray 4 mg/0.1 mL [id number] No Place 1 spray into the nose as needed (opoid overdose). [name], DO Taking  Active   nitroGLYCERIN  (NITROSTAT ) 0.4 MG SL tablet [id number] No Place 1 tablet (0.4 mg total) under the tongue every 5 (five) minutes as needed for chest pain. [name], DO Taking Active   nystatin  (MYCOSTATIN /NYSTOP ) powder [id number] No Apply 1 Application topically as needed. [provider] Taking Active   omeprazole  (PRILOSEC) 40 MG capsule [id number]  Take 1 capsule (40 mg total) by mouth daily. [name], DO  Active   OneTouch Delica Lancets 33G [place] [id number] No Use to check blood glucose up to 4 times a day [name], DO Taking Active Self, Pharmacy Records  [facility] VERIO test strip [id number] No Use as instructed [name], DO Taking Active Self, Pharmacy Records  Oxycodone  HCl 20 MG TABS [id number]  Take 1 tablet (20 mg total) by mouth in the morning, at noon, and at bedtime. [name], DO  Active   Polyethyl Glyc-Propyl Glyc PF (SYSTANE PRESERVATIVE FREE) 0.4-0.3 % SOLN [id number] No Place 2 drops into both eyes in the morning and at bedtime. [provider] Taking Active Self, Pharmacy Records  pramipexole  (MIRAPEX ) 1 MG tablet [id number]  Take 1 tablet (1 mg total) by mouth 3 (three) times daily. [name], DO  Active   pregabalin  (LYRICA ) 50 MG capsule [id number]  TAKE 1 CAPSULE BY MOUTH THREE TIMES  DAILY Raulkar, Sven SQUIBB, MD  Active   promethazine  (PHENERGAN ) 25 MG tablet [id number] No Take 1 tablet (25 mg total) by mouth every 8 (eight) hours as needed for vomiting or nausea.  Patient taking differently: Take 25 mg by mouth as needed for vomiting or nausea.   [name], DO Taking Active Self, Pharmacy Records  spironolactone  (ALDACTONE ) 50 MG tablet [id number] No Take 1 tablet (50 mg total) by mouth daily. [name], DO Taking Active   Vitamin D , Ergocalciferol , (DRISDOL ) 1.25 MG (50000 UNIT) CAPS capsule [id number]  Take 1 capsule by mouth once a week [name], DO  Active             Recommendation:   Continue Current Plan of Care  Follow Up Plan:   Telephone follow up appointment date/time:  04/25/24 at 2:00  SIG  Olam Idol BSN RN CCM [place]  [facility] RN Care Manager Direct Dial: [phone] Fax: [phone]

## 2024-03-31 ENCOUNTER — Other Ambulatory Visit: Payer: Self-pay | Admitting: Family Medicine

## 2024-04-16 ENCOUNTER — Telehealth: Payer: Self-pay

## 2024-04-16 NOTE — Telephone Encounter (Signed)
 Tried call [facility] back unable to Lvm, we only received the 1 set of forms. Dr.Wendling  Has signed and forms was fax back, we haven't received any other forms.

## 2024-04-16 NOTE — Telephone Encounter (Signed)
 Copied from CRM #[id number]. Topic: General - Other >> Apr 16, 2024  8:42 AM Mesmerise C wrote: Reason for CRM: Donny from [facility] checking orders for home health and  certification of plan of care for [address] and 518 order #[id number], states he signed multiple order orders already just not this one, can be reached at [phone] has a confidential VM

## 2024-04-22 ENCOUNTER — Encounter (HOSPITAL_BASED_OUTPATIENT_CLINIC_OR_DEPARTMENT_OTHER): Admitting: General Surgery

## 2024-04-23 ENCOUNTER — Ambulatory Visit: Admitting: Family Medicine

## 2024-04-25 ENCOUNTER — Telehealth: Payer: Self-pay | Admitting: *Deleted

## 2024-04-25 ENCOUNTER — Other Ambulatory Visit: Payer: Self-pay | Admitting: Family Medicine

## 2024-04-28 ENCOUNTER — Other Ambulatory Visit: Payer: Self-pay | Admitting: Family Medicine

## 2024-04-29 ENCOUNTER — Ambulatory Visit: Admitting: Family Medicine

## 2024-04-29 ENCOUNTER — Telehealth: Admitting: Family Medicine

## 2024-04-29 ENCOUNTER — Encounter: Payer: Self-pay | Admitting: Family Medicine

## 2024-04-29 DIAGNOSIS — G8929 Other chronic pain: Secondary | ICD-10-CM

## 2024-04-29 DIAGNOSIS — E1165 Type 2 diabetes mellitus with hyperglycemia: Secondary | ICD-10-CM | POA: Diagnosis not present

## 2024-04-29 DIAGNOSIS — Z794 Long term (current) use of insulin: Secondary | ICD-10-CM | POA: Diagnosis not present

## 2024-04-29 DIAGNOSIS — N3000 Acute cystitis without hematuria: Secondary | ICD-10-CM | POA: Diagnosis not present

## 2024-04-29 DIAGNOSIS — J4541 Moderate persistent asthma with (acute) exacerbation: Secondary | ICD-10-CM

## 2024-04-29 MED ORDER — TRULICITY 1.5 MG/0.5ML ~~LOC~~ SOAJ: 1.5000 mg | SUBCUTANEOUS | 0 refills | Status: DC

## 2024-04-29 MED ORDER — TRULICITY 0.75 MG/0.5ML ~~LOC~~ SOAJ: 0.7500 mg | SUBCUTANEOUS | 0 refills | Status: AC

## 2024-04-29 MED ORDER — DAPAGLIFLOZIN PROPANEDIOL 10 MG PO TABS: 10.0000 mg | ORAL_TABLET | Freq: Every day | ORAL | 2 refills | Status: AC

## 2024-04-29 MED ORDER — OXYCODONE HCL 20 MG PO TABS: 20.0000 mg | ORAL_TABLET | Freq: Three times a day (TID) | ORAL | 0 refills | Status: DC

## 2024-04-29 MED ORDER — NOVOLOG FLEXPEN RELION 100 UNIT/ML ~~LOC~~ SOPN
60.0000 [IU] | PEN_INJECTOR | Freq: Three times a day (TID) | SUBCUTANEOUS | 3 refills | Status: DC
Start: 2024-04-29 — End: 2024-05-16

## 2024-04-29 MED ORDER — FERROUS SULFATE 325 (65 FE) MG PO TABS: 325.0000 mg | ORAL_TABLET | Freq: Every day | ORAL | Status: AC

## 2024-04-29 MED ORDER — TRULICITY 3 MG/0.5ML ~~LOC~~ SOAJ: 3.0000 mg | SUBCUTANEOUS | 0 refills | Status: DC

## 2024-04-29 MED ORDER — FLUCONAZOLE 150 MG PO TABS
ORAL_TABLET | ORAL | 0 refills | Status: DC
Start: 2024-04-29 — End: 2024-06-06

## 2024-04-29 MED ORDER — TRULICITY 4.5 MG/0.5ML ~~LOC~~ SOAJ: 4.5000 mg | SUBCUTANEOUS | 5 refills | Status: DC

## 2024-04-29 MED ORDER — DILTIAZEM HCL ER COATED BEADS 360 MG PO CP24: 360.0000 mg | ORAL_CAPSULE | Freq: Every day | ORAL | 3 refills | Status: DC

## 2024-04-29 MED ORDER — FLUTICASONE-SALMETEROL 100-50 MCG/ACT IN AEPB: 1.0000 | INHALATION_SPRAY | Freq: Two times a day (BID) | RESPIRATORY_TRACT | 3 refills | Status: DC

## 2024-04-29 MED ORDER — AMOXICILLIN-POT CLAVULANATE 875-125 MG PO TABS: 1.0000 | ORAL_TABLET | Freq: Two times a day (BID) | ORAL | 0 refills | Status: AC

## 2024-04-29 NOTE — Progress Notes (Signed)
 Subjective:   Chief Complaint  Patient presents with   Follow-up    Follow Up    [name] is a 63 y.o. female here for follow-up of diabetes.  We are interacting via web portal for an electronic face-to-face visit. I verified patient's ID using 2 identifiers. Patient agreed to proceed with visit via this method. Patient is at home, I am at office. Patient, her spouse and I are present for visit.   [name]'s self monitored glucose range is 200's.  Patient denies hypoglycemic reactions. She has a CGM.  Patient does require insulin .   Medications include: Farxiga  10 mg/d, metformin  XR 1000 mg bid Diet could be better.   Exercise: none No CP or new SOB.  Chronic pain Hx of chronic pain on oxycodone . Chronic, stable. Was seeing pain med, but current mobility makes seeing them very difficult and their office policy  UTI 1.5 weeks. She has an indwelling catheter. Having suprapubic pain, urgency. No bleeding, dc, fevers, N/V, flank pain, irritability.   Patient has a history of asthma.  Humidity serves as a flare.  She does not have a daily inhaler currently.  She is using her albuterol  on a daily basis.  It does help for period time.  She is wheezing, coughing and having shortness of breath.  Past Medical History:  Diagnosis Date   Aortic stenosis    mild AS by echo 02/2021   Asthma    on daily RX and rescue inhaler (04/18/2018)   Chronic diastolic CHF (congestive heart failure) (HCC) 09/2015   Chronic lower back pain    Chronic neck pain    Chronic pain syndrome    Fentanyl  Patch   Colon polyps    Coronary artery disease    cath with normal LM, 30% LAD, 85% mid RCA and 95% distal RCA s/p PCI of the mid to distal RCA and now on DAPT with ASA and Ticagrelor .     Eczema    Excessive daytime sleepiness 11/26/2015   Fibromyalgia    Gallstones    GERD (gastroesophageal reflux disease)    Heart murmur    noted for the 1st time on 04/18/2018   History of blood transfusion 07/2010    S/P oophorectomy   History of gout    History of hiatal hernia 1980s   gone now (04/18/2018)   Hyperlipidemia    Hypertension    takes Metoprolol  and Enalapril  daily   Hypothyroidism    takes Synthroid  daily   IBS (irritable bowel syndrome)    Migraine    nothing in the 2000s (04/18/2018)   Mixed connective tissue disease (HCC)    NAFLD (nonalcoholic fatty liver disease)    Pneumonia    several times (04/18/2018)   PVC's (premature ventricular contractions)    noted on event monitor 02/2021   Rheumatoid arthritis (HCC)    hands, elbows, shoulders, probably knees (04/18/2018)   Scoliosis    Sjogren's syndrome (HCC)    Sleep apnea    mild - does not use cpap   Spondylosis    Type II diabetes mellitus (HCC)    takes Metformin  and Hum R daily (04/18/2018)   Walker as ambulation aid    also uses wheelchair     Related testing: Retinal exam: Due Pneumovax: done  Objective:  No conversational dyspnea Age appropriate judgment and insight Nml affect and mood   Assessment:   Other chronic pain - Plan: Oxycodone  HCl 20 MG TABS  Type 2 diabetes mellitus with  hyperglycemia, with long-term current use of insulin  (HCC) - Plan: insulin  aspart (NOVOLOG  FLEXPEN) 100 UNIT/ML FlexPen, dapagliflozin  propanediol (FARXIGA ) 10 MG TABS tablet, Dulaglutide  (TRULICITY ) 0.75 MG/0.5ML SOAJ, Dulaglutide  (TRULICITY ) 1.5 MG/0.5ML SOAJ, Dulaglutide  (TRULICITY ) 3 MG/0.5ML SOAJ, Dulaglutide  (TRULICITY ) 4.5 MG/0.5ML SOAJ  Acute cystitis without hematuria - Plan: amoxicillin -clavulanate (AUGMENTIN ) 875-125 MG tablet  Moderate persistent asthma with acute exacerbation - Plan: fluticasone -salmeterol (ADVAIR) 100-50 MCG/ACT AEPB   Plan:   Chronic, stable.  We are filling her oxycodone  20 mg 3 times daily as needed until she can physically get back to her pain clinic. Chronic, not controlled.  Endocrinology appointment is in 3 months.  She will continue NovoLog  60 units 3 times daily, Farxiga  10 mg  daily, metformin  XR 1000 mg twice daily, and long-acting insulin  60 units nightly.  She may need a pump.  Will add Trulicity  0.75 mg weekly and titrate up in the meanwhile.  Counseled on diet and exercise. She has an indwelling Foley catheter.  Due to mobility concerns, this has been maintained and she does need to follow-up with the urology team to have it removed.  7 days of Augmentin  given her associated pain. Chronic, not controlled.  Continue albuterol  as needed.  Add back daily inhaler, rinse mouth out after use. F/u in 1 mo. The patient voiced understanding and agreement to the plan.  [name] Mt [place], DO 04/29/24 11:11 AM

## 2024-05-01 ENCOUNTER — Telehealth: Payer: Self-pay

## 2024-05-01 NOTE — Telephone Encounter (Signed)
 Copied from CRM #[id number]. Topic: Clinical - Prescription Issue >> May 01, 2024 11:43 AM Burnard DEL wrote: Reason for CRM: Patient had an appointment with provider 8/19.She told the provider that she needed a refill on farxiga ,however she forgot that she was taking elequis instead.Patient would like to know what should she do as far as the medication? She got prescription from pharmacy but realized that she told provider wrong medication.

## 2024-05-02 NOTE — Telephone Encounter (Signed)
 Tried calling pt and sent her message asking her to us  know about medications needed.

## 2024-05-05 ENCOUNTER — Telehealth: Payer: Self-pay

## 2024-05-05 NOTE — Telephone Encounter (Signed)
 Copied from CRM [id number]. Topic: Clinical - Medication Question >> May 05, 2024  2:29 PM Thersia C wrote: Reason for CRM: Patient stated she needs more than the insurance will pay for it so she needs a prior auth for the prescription insulin  aspart (NOVOLOG  FLEXPEN) 100 UNIT/ML FlexPen  Would like the status of the prior auth . Patient stated she is out of it and needs it as soon as possible

## 2024-05-06 NOTE — Telephone Encounter (Signed)
 Pt called in today to see if there is an update for her PA. She is completely out of her insulin  and her sugar has been high. She states she in taking her basal insulin  at nighttime.

## 2024-05-07 MED ORDER — HUMALOG KWIKPEN 200 UNIT/ML ~~LOC~~ SOPN: 60.0000 [IU] | PEN_INJECTOR | Freq: Three times a day (TID) | SUBCUTANEOUS | 1 refills | Status: DC

## 2024-05-07 NOTE — Telephone Encounter (Signed)
 I did not see any prior authorization requests in patient chart and did not see any pending requests for prior authorization for [name] in Cover My Meds. Contacted [facility] and asked if they could send / resend prior authorization request.  Tried to send prior authorization thru Cover My Meds and received the following message:     Called her insurance - [employer] to asked about prior authorization / need to update quantity limit.  After 3 calls / 2 of which were dropped on the insurance end and over an hour on the phone I have not been able to make any headway on this.  Patient is supposed to get 45mL of Novolog  with directions of 60 units 3 times a day which is a 25 day supply.  Finally spoke to someone who states that they have prior authorization on file and also a quantity limit override but it is for max of 1.5 mL per day or 45mL = 28 day supply. I tried again but unsuccessful at finding someone that could help.   Called patient to discuss options - continue Novolog  but have [facility] filled 45 mL as a 28 day supply instead of as a 25 DS. Or we could change to Humalog  U200 - she would still take same dose of 60 units 3 times a day.  Discussed recent blood glucose readings. I suggested since blood glucose is high throughout the night and day to increase Basaglar  to 65 units once a day (could try [place] U200 in the future)  Patient also has not started Trulicity  yet. She was worried that she would lose her inusrane 05/12/24 and did not want to start Trulicity  if she  would not be able to continue. She got insurance issues straightened out and states she will start Trulicity  this week. Verified with [facility] that Humalog  U200 did go thru and Trulicity  is also ready for pick up.  Madelin Ray, PharmD Clinical Pharmacist [facility]  Followup planned in 1 weeks.

## 2024-05-07 NOTE — Addendum Note (Signed)
 Addended by: CARLA MILLING B on: 05/07/2024 03:05 PM   Modules accepted: Orders

## 2024-05-13 ENCOUNTER — Ambulatory Visit (HOSPITAL_BASED_OUTPATIENT_CLINIC_OR_DEPARTMENT_OTHER): Admitting: General Surgery

## 2024-05-13 ENCOUNTER — Other Ambulatory Visit: Payer: Self-pay

## 2024-05-13 ENCOUNTER — Encounter: Payer: Self-pay | Admitting: Family Medicine

## 2024-05-13 DIAGNOSIS — G8929 Other chronic pain: Secondary | ICD-10-CM

## 2024-05-13 DIAGNOSIS — E1165 Type 2 diabetes mellitus with hyperglycemia: Secondary | ICD-10-CM

## 2024-05-13 MED ORDER — AMOXICILLIN 500 MG PO TABS: ORAL_TABLET | ORAL | 0 refills | Status: DC

## 2024-05-16 ENCOUNTER — Other Ambulatory Visit: Payer: Self-pay | Admitting: Family Medicine

## 2024-05-16 ENCOUNTER — Other Ambulatory Visit: Payer: Self-pay | Admitting: Pharmacist

## 2024-05-16 DIAGNOSIS — E559 Vitamin D deficiency, unspecified: Secondary | ICD-10-CM

## 2024-05-16 NOTE — Progress Notes (Signed)
 05/16/2024 Name: [name] MRN: [id number] DOB: 1961/05/05  Chief Complaint  Patient presents with   Medication Management   Diabetes    [name] is a 63 y.o. year old female who presented for a telephone visit.   They were referred to the pharmacist by their PCP for assistance in managing diabetes and complex medication management.   Subjective:  Care Team: Primary Care Provider: [name], DO ; Next Scheduled Visit: 06/06/2024  Medication Access/Adherence  Current Pharmacy:  [facility] [address] [place] - [address] [place] [address] Phone: [phone] Fax: [phone]   Patient reports affordability concerns with their medications: No    Patient reports access/transportation concerns to their pharmacy: No  - patient's husband helps with transportation Patient reports adherence concerns with their medications:  Yes  - varies insulin  dose     Diabetes:  Current medications:  metformin  ER 500mg  - 2 tablets = 1000mg  twice a day Basaglar  / insulin  glargine - her med list has 30 units but she was instructed to increase by 2 to 3 units  Humalog  U200 insulin  - her med list has 30 units 3 times a day but she has increased to 55 units 3 times a day.  Trulicity  0.75mg  weekly - first dose was Sunday 05/11/2024 states she has felt a little nauseated.   Current glucose readings: Using Dex Com 7 Continuous Glucose Monitor system Patient report the following from her DexCom report Last 14 days Average Glucose: 227 mg/dL Glucose Management Indicator: 8.7%  Time in Goal:  - Time in range 70-180: 29% - Time very high range: 34% - Time high range: 37% - Time low range: 0% - Time very low range: 0%  Average blood glucose is similar but blood glucose over the last few days since she started Trulicity .        Objective:  Lab Results  Component Value Date   HGBA1C 8.0 (H) 09/10/2023    Lab Results   Component Value Date   CREATININE 1.09 (H) 10/01/2023   BUN 24 (H) 10/01/2023   NA 137 10/01/2023   K 3.5 10/01/2023   CL 92 (L) 10/01/2023   CO2 29 10/01/2023    Lab Results  Component Value Date   CHOL 147 05/08/2023   HDL 39.10 05/08/2023   LDLCALC 79 05/08/2023   LDLDIRECT 121.0 10/27/2020   TRIG 142.0 05/08/2023   CHOLHDL 4 05/08/2023   Lab Results  Component Value Date   WBC 7.6 10/01/2023   HGB 11.8 (L) 10/01/2023   HCT 44.2 10/01/2023   MCV 79.2 (L) 10/01/2023   PLT 349 10/01/2023    Medications Reviewed Today     Reviewed by Carla Milling, RPH-CPP (Pharmacist) on 05/16/24 at 1316  Med List Status: <None>   Medication Order Taking? Sig Documenting Provider Last Dose Status Informant  acetaminophen  (TYLENOL ) 500 MG tablet [id number]  Take 1,000 mg by mouth as needed for mild pain (pain score 1-3). [provider]  Active Self, Pharmacy Records  albuterol  (PROVENTIL ) (2.5 MG/3ML) 0.083% nebulizer solution [id number]  USE 1 VIAL IN NEBULIZER EVERY 4 HOURS AS NEEDED FOR WHEEZING FOR SHORTNESS OF BREATH Wendling, [name], DO  Active Self, Pharmacy Records  albuterol  (VENTOLIN  HFA) 108 [phone] Base) MCG/ACT inhaler [id number]  Inhale 2 puffs into the lungs every 4 (four) hours as needed for wheezing or shortness of breath. [name], DO  Active   amoxicillin  (AMOXIL ) 500  MG tablet [id number]  Take twice daily for 5 days. [name], DO  Active   apixaban  (ELIQUIS ) 5 MG TABS tablet [id number]  Take 1 tablet (5 mg total) by mouth 2 (two) times daily. [name], DO  Active   augmented betamethasone  dipropionate (DIPROLENE -AF) 0.05 % cream [id number]  APPLY 1 APPLICATION TOPICALLY TWICE DAILY AS NEEDED (RASH) Wendling, [name], DO  Active   Continuous Glucose Sensor (DEXCOM G7 SENSOR) MISC [id number]  USE TO CHECK FOR BLOOD SUGAR CONTINUOUSLY- CHANGE SENSOR EVERY 10 DAYS Wendling, [name], DO  Active   dapagliflozin   propanediol (FARXIGA ) 10 MG TABS tablet [id number]  Take 1 tablet (10 mg total) by mouth daily before breakfast. [name], DO  Active   diclofenac  (FLECTOR ) 1.3 % 1 patch [id number]   [name], DO  Active   diclofenac  (FLECTOR ) 1.3 % PTCH [id number]  PLACE 1 PATCH ONTO THE SKIN TWICE DAILY - WEAR PATCH UP TO 12 HOURS THEN REMOVE AND REPLACE WITH A NEW PATCH AS NEEDED Frann, [name], DO  Active   diclofenac  Sodium (PENNSAID ) 2 % SOLN [id number]  Apply 2 Pump (40 mg total) topically 2 (two) times daily as needed. 1 application to back twice daily [name], DO  Active   diltiazem  (CARDIZEM  CD) 360 MG 24 hr capsule [id number]  Take 1 capsule (360 mg total) by mouth daily. [name], DO  Active   Dulaglutide  (TRULICITY ) 0.75 MG/0.5ML EMMANUEL [id number]  Inject 0.75 mg into the skin once a week for 28 days. [name], DO  Active   Dulaglutide  (TRULICITY ) 1.5 MG/0.5ML EMMANUEL [id number]  Inject 1.5 mg into the skin once a week for 28 days. [name], DO  Active   Dulaglutide  (TRULICITY ) 3 MG/0.5ML EMMANUEL [id number]  Inject 3 mg as directed once a week for 28 days. [name], DO  Active   Dulaglutide  (TRULICITY ) 4.5 MG/0.5ML EMMANUEL [id number]  Inject 4.5 mg as directed once a week. [name], DO  Active   EPINEPHrine  0.3 mg/0.3 mL IJ SOAJ injection [id number]  Inject 0.3 mg into the muscle as needed for anaphylaxis. [name], DO  Active Self, Pharmacy Records           Med Note [facility] S[facility]   Mon Oct 08, 2023 12:03 PM) PRN for Anaphylaxis/allergic reaction  ferrous sulfate  (FEROSUL) 325 (65 FE) MG tablet [id number]  Take 1 tablet (325 mg total) by mouth daily with breakfast. [name], DO  Active   fluconazole  (DIFLUCAN ) 150 MG tablet [id number]  Take 1 tab, repeat in 72 hours if no improvement. [name], DO  Active   fluticasone  (FLONASE ) 50 MCG/ACT nasal spray  [id number]  Place 2 sprays into both nostrils daily as needed for allergies or rhinitis. [name], DO  Active   fluticasone -salmeterol (ADVAIR) 100-50 MCG/ACT AEPB [id number]  Inhale 1 puff into the lungs 2 (two) times daily. [name], DO  Active   furosemide  (LASIX ) 40 MG tablet [id number]  Take 1 tablet (40 mg total) by mouth daily. [name], DO  Active   gabapentin  (NEURONTIN ) 400 MG capsule [id number]  Take 1 capsule (400 mg total) by mouth 4 (four) times daily. TAKE 1 CAPSULE BY MOUTH IN THE MORNING AND 1 AT NOON AND 2 AT BEDTIME Strength: 400 mg Raulkar, Sven SQUIBB, MD  Active Self, Pharmacy Records           Med Note [facility],  Adriana Lina B   Wed Jan 02, 2024  4:01 PM)    gentamicin ointment (GARAMYCIN) 0.1 % [id number]  Apply 1 Application topically in the morning and at bedtime. [provider]  Active Self, Pharmacy Records  Glucagon , rDNA, (GLUCAGON  EMERGENCY) 1 MG KIT [id number]  Inject 1 mg into the vein daily as needed. [name], DO  Active     Discontinued 05/16/24 1315 (Change in therapy)   Insulin  Glargine (BASAGLAR  KWIKPEN) 100 UNIT/ML [id number]  Inject 60 Units into the skin at bedtime. [name], DO  Active   insulin  lispro (HUMALOG  KWIKPEN) 200 UNIT/ML KwikPen [id number]  Inject 60 Units into the skin 3 (three) times daily before meals. (Replaces Novolog  Flex Pen U100) Wendling, [name], DO  Active   Insulin  Syringe-Needle U-100 (INSULIN  SYRINGE .5CC/30GX5/16) 30G X 5/16 0.5 ML MISC [id number]  Take as directed with Novolog  inuslin.  15-20 units per day  Patient not taking: Reported on 03/03/2024   [name], DO  Active   Insulin  Syringes, Disposable, U-100 0.5 ML MISC [id number]  100 each by Does not apply route 4 (four) times daily -  before meals and at bedtime.  Patient not taking: Reported on 03/03/2024   Caleen Burgess BROCKS, MD  Active   isosorbide  mononitrate (IMDUR ) 60 MG 24 hr tablet  [id number]  Take 1 tablet (60 mg total) by mouth daily. Swinyer, Rosaline HERO, NP  Active Self, Pharmacy Records  levocetirizine (XYZAL ) 5 MG tablet [id number]  Take 1 tablet (5 mg total) by mouth every evening. [name], DO  Active   levothyroxine  (SYNTHROID ) 200 MCG tablet [id number]  Take 1 tablet (200 mcg total) by mouth daily before breakfast. [name], DO  Active   lidocaine  (LIDODERM ) 5 % [id number]  Place 1 patch onto the skin every 12 (twelve) hours. Remove & Discard patch within 12 hours or as directed  Patient taking differently: Place 1 patch onto the skin daily as needed (pain). Remove & Discard patch within 12 hours or as directed   Debby Fidela CROME, NP  Active Self, Pharmacy Records  lidocaine  (XYLOCAINE ) 5 % ointment [id number]  Apply 1 Application topically in the morning and at bedtime. [provider]  Active Self, Pharmacy Records  metFORMIN  (GLUCOPHAGE -XR) 500 MG 24 hr tablet [id number]  Take 2 tablets by mouth twice daily Wendling, [name], DO  Active   Multiple Vitamins-Minerals (WOMENS 50+ MULTI VITAMIN PO) [id number]  Take 1 tablet by mouth in the morning and at bedtime. [provider]  Active Self, Pharmacy Records  mupirocin  ointment (BACTROBAN ) 2 % [id number]  Apply topically 2 (two) times daily. [name], DO  Active   naloxone  (NARCAN ) nasal spray 4 mg/0.1 mL [id number]  Place 1 spray into the nose as needed (opoid overdose). [name], DO  Active   nitroGLYCERIN  (NITROSTAT ) 0.4 MG SL tablet [id number]  Place 1 tablet (0.4 mg total) under the tongue every 5 (five) minutes as needed for chest pain. [name], DO  Active   nystatin  (MYCOSTATIN /NYSTOP ) powder [id number]  Apply 1 Application topically as needed. [provider]  Active   omeprazole  (PRILOSEC) 40 MG capsule [id number]  Take 1 capsule (40 mg total) by mouth daily. [name], DO  Active   OneTouch Delica  Lancets 33G [place] [id number]  Use to check blood glucose up to 4 times a day [name], DO  Active Self, Pharmacy Records  [facility] VERIO test strip [id number]  Use as instructed [name], DO  Active Self, Pharmacy Records  Oxycodone  HCl 20 MG TABS [id number]  Take 1 tablet (20 mg total) by mouth in the morning, at noon, and at bedtime. [name], DO  Active   Polyethyl Glyc-Propyl Glyc PF (SYSTANE PRESERVATIVE FREE) 0.4-0.3 % SOLN [id number]  Place 2 drops into both eyes in the morning and at bedtime. [provider]  Active Self, Pharmacy Records  pramipexole  (MIRAPEX ) 1 MG tablet [id number]  TAKE 1 TABLET BY MOUTH THREE TIMES DAILY [name], DO  Active   pregabalin  (LYRICA ) 50 MG capsule [id number]  TAKE 1 CAPSULE BY MOUTH THREE TIMES DAILY Raulkar, Sven SQUIBB, MD  Active   promethazine  (PHENERGAN ) 25 MG tablet [id number]  Take 1 tablet (25 mg total) by mouth every 8 (eight) hours as needed for vomiting or nausea.  Patient taking differently: Take 25 mg by mouth as needed for vomiting or nausea.   [name], DO  Active Self, Pharmacy Records  spironolactone  (ALDACTONE ) 50 MG tablet [id number]  Take 1 tablet (50 mg total) by mouth daily. [name], DO  Active   Vitamin D , Ergocalciferol , (DRISDOL ) 1.25 MG (50000 UNIT) CAPS capsule [id number]  Take 1 capsule by mouth once a week [name], DO  Active               Assessment/Plan:   Diabetes:Currently uncontrolled per last A1c over the last 7 days blood glucose improved  - Reviewed goal A1c, goal fasting, and goal 2 hour post prandial glucose - Continue Basaglar  to 60 units once day. (May need to change to Toujeo  max or Tresiba U200 in the future).   - Recommended she continue Humalog  U200 60 units 3 times day - Continue Trulicity  0.75mg  weekly     Follow Up Plan: 1 week  Madelin Ray, PharmD Clinical Pharmacist [facility]  [facility]

## 2024-05-23 ENCOUNTER — Other Ambulatory Visit: Payer: Self-pay | Admitting: Pharmacist

## 2024-05-23 NOTE — Progress Notes (Signed)
 05/23/2024 Name: [name] MRN: [id number] DOB: 18-Aug-1961  Chief Complaint  Patient presents with   Diabetes    [name] Grave is a 63 y.o. year old female who presented for a telephone visit.   They were referred to the pharmacist by their PCP for assistance in managing diabetes and complex medication management.    Subjective:  Care Team: Primary Care Provider: [name], DO ; Next Scheduled Visit: 06/04/2024  Medication Access/Adherence  Current Pharmacy:  [facility] [address], [place] - [address] [place] [address] [place] [address] Phone: [phone] Fax: [phone]   Patient reports affordability concerns with their medications: No  Patient reports access/transportation concerns to their pharmacy: No  Patient reports adherence concerns with their medications:  No      Diabetes:  Current medications:  metformin  ER 500mg  - 2 tablets = 1000mg  twice a day Farxiga  10mg  daily  Basaglar  / insulin  glargine - 60 units daily. She has not taken in the last 2 days due to having a stomach bug which made her nauseated and had loose stools. She reports she is better today and will restart Basaglar  60 units daily Humalog  U200 insulin  - 60 units 3 times a day - has not taken in last 2 days due to decrease oral intake Trulicity  0.75mg  weekly - first dose was Sunday 05/11/2024  Past medications tried: Victoza  cause severe, debilitating nausea.   Current glucose readings: Using Dex Com G7 sensors  Date of Download: 05/23/2024 % Time CGM is active: 96.4% Average Glucose: 209 mg/dL Glucose Management Indicator: 8.3  Glucose Variability: 26.5 (goal <36%) Time in Goal:  - Time in range 70-180: 30% - Time 181 to 250: 49% - Time above >250: 21% - Time below range: 0% Observed patterns: post prandial and fasting blood glucose both have improved since Trulicity  was started.     Macrovascular and Microvascular Risk  Reduction:  Statin? no; patient previously intolerant to statin therapy; ACEi/ARB? ACE cough with enalapril  Last urinary albumin/creatinine ratio:  Lab Results  Component Value Date   MICRALBCREAT 30 (H) 05/26/2022   MICRALBCREAT 9 02/03/2020   Last eye exam:  not recently Last foot exam: No foot exam found Tobacco Use:  Tobacco Use: Medium Risk (04/29/2024)   Patient History    Smoking Tobacco Use: Former    Smokeless Tobacco Use: Never    Passive Exposure: Past     Objective:  BP Readings from Last 3 Encounters:  03/26/24 128/78  03/11/24 118/70  02/26/24 120/82     Lab Results  Component Value Date   HGBA1C 8.0 (H) 09/10/2023    Lab Results  Component Value Date   CREATININE 1.09 (H) 10/01/2023   BUN 24 (H) 10/01/2023   NA 137 10/01/2023   K 3.5 10/01/2023   CL 92 (L) 10/01/2023   CO2 29 10/01/2023    Lab Results  Component Value Date   CHOL 147 05/08/2023   HDL 39.10 05/08/2023   LDLCALC 79 05/08/2023   LDLDIRECT 121.0 10/27/2020   TRIG 142.0 05/08/2023   CHOLHDL 4 05/08/2023    Medications Reviewed Today     Reviewed by Carla Milling, RPH-CPP (Pharmacist) on 05/23/24 at 1314  Med List Status: <None>   Medication Order Taking? Sig Documenting Provider Last Dose Status Informant  acetaminophen  (TYLENOL ) 500 MG tablet [id number] No Take 1,000 mg by mouth as needed for mild pain (pain score 1-3). [provider] Taking Active Self, Pharmacy Records  albuterol  (PROVENTIL ) (2.5 MG/3ML) 0.083% nebulizer solution [id number] No USE 1 VIAL IN NEBULIZER EVERY 4 HOURS AS NEEDED FOR WHEEZING FOR SHORTNESS OF BREATH Wendling, [name], DO Taking Active Self, Pharmacy Records  albuterol  (VENTOLIN  HFA) [phone] Base) MCG/ACT inhaler [id number]  Inhale 2 puffs into the lungs every 4 (four) hours as needed for wheezing or shortness of breath. [name], DO  Active   amoxicillin  (AMOXIL ) 500 MG tablet [id number]  Take twice daily for 5 days.  [name], DO  Active   apixaban  (ELIQUIS ) 5 MG TABS tablet [id number] No Take 1 tablet (5 mg total) by mouth 2 (two) times daily. [name], DO Taking Active   augmented betamethasone  dipropionate (DIPROLENE -AF) 0.05 % cream [id number] No APPLY 1 APPLICATION TOPICALLY TWICE DAILY AS NEEDED (RASH) [name], DO Taking Active   Continuous Glucose Sensor (DEXCOM G7 SENSOR) MISC [id number]  USE TO CHECK FOR BLOOD SUGAR CONTINUOUSLY- CHANGE SENSOR EVERY 10 DAYS [name], DO  Active   dapagliflozin  propanediol (FARXIGA ) 10 MG TABS tablet [id number]  Take 1 tablet (10 mg total) by mouth daily before breakfast. [name], DO  Active   diclofenac  (FLECTOR ) 1.3 % 1 patch [id number]   [name], DO  Active   diclofenac  (FLECTOR ) 1.3 % PTCH [id number]  PLACE 1 PATCH ONTO THE SKIN TWICE DAILY - WEAR PATCH UP TO 12 HOURS THEN REMOVE AND REPLACE WITH A NEW PATCH AS NEEDED [name], DO  Active   diclofenac  Sodium (PENNSAID ) 2 % SOLN [id number] No Apply 2 Pump (40 mg total) topically 2 (two) times daily as needed. 1 application to back twice daily [name], DO Taking Active   diltiazem  (CARDIZEM  CD) 360 MG 24 hr capsule [id number]  Take 1 capsule (360 mg total) by mouth daily. [name], DO  Active   Dulaglutide  (TRULICITY ) 0.75 MG/0.5ML EMMANUEL [id number]  Inject 0.75 mg into the skin once a week for 28 days. [name], DO  Active   Dulaglutide  (TRULICITY ) 1.5 MG/0.5ML EMMANUEL [id number]  Inject 1.5 mg into the skin once a week for 28 days. [name], DO  Active   Dulaglutide  (TRULICITY ) 3 MG/0.5ML EMMANUEL [id number]  Inject 3 mg as directed once a week for 28 days. [name], DO  Active   Dulaglutide  (TRULICITY ) 4.5 MG/0.5ML EMMANUEL [id number]  Inject 4.5 mg as directed once a week. [name], DO  Active   EPINEPHrine  0.3 mg/0.3 mL IJ SOAJ injection [id number]  No Inject 0.3 mg into the muscle as needed for anaphylaxis. [name], DO Taking Active Self, Pharmacy Records           Med Note [facility]   Mon Oct 08, 2023 12:03 PM) PRN for Anaphylaxis/allergic reaction  ferrous sulfate  (FEROSUL) 325 (65 FE) MG tablet [id number]  Take 1 tablet (325 mg total) by mouth daily with breakfast. [name], DO  Active   fluconazole  (DIFLUCAN ) 150 MG tablet [id number]  Take 1 tab, repeat in 72 hours if no improvement. [name], DO  Active   fluticasone  (FLONASE ) 50 MCG/ACT nasal spray [id number] No Place 2 sprays into both nostrils daily as needed for allergies or rhinitis. [name], DO Taking Active   fluticasone -salmeterol (ADVAIR) 100-50 MCG/ACT AEPB [id number]  Inhale 1 puff into the lungs 2 (two) times daily. [name], DO  Active   furosemide  (LASIX ) 40 MG tablet [id number]  Take 1 tablet (  40 mg total) by mouth daily. [name], DO  Active   gabapentin  (NEURONTIN ) 400 MG capsule [id number] No Take 1 capsule (400 mg total) by mouth 4 (four) times daily. TAKE 1 CAPSULE BY MOUTH IN THE MORNING AND 1 AT NOON AND 2 AT BEDTIME Strength: 400 mg Raulkar, Sven SQUIBB, MD Taking Active Self, Pharmacy Records           Med Note [facility]   Wed Jan 02, 2024  4:01 PM)    gentamicin ointment (GARAMYCIN) 0.1 % [id number] No Apply 1 Application topically in the morning and at bedtime. [provider] Taking Active Self, Pharmacy Records  Glucagon , rDNA, (GLUCAGON  EMERGENCY) 1 MG KIT [id number] No Inject 1 mg into the vein daily as needed. [name], DO Taking Active   Insulin  Glargine (BASAGLAR  KWIKPEN) 100 UNIT/ML [id number]  Inject 60 Units into the skin at bedtime. [name], DO  Active   insulin  lispro (HUMALOG  KWIKPEN) 200 UNIT/ML KwikPen [id number]  Inject 60 Units into the skin 3 (three) times daily before meals. (Replaces Novolog  Flex Pen U100)  [name], DO  Active   Insulin  Syringe-Needle U-100 (INSULIN  SYRINGE .5CC/30GX5/16) 30G X 5/16 0.5 ML MISC [id number]  Take as directed with Novolog  inuslin.  15-20 units per day  Patient not taking: Reported on 03/03/2024   [name], DO  Active   Insulin  Syringes, Disposable, U-100 0.5 ML MISC [id number]  100 each by Does not apply route 4 (four) times daily -  before meals and at bedtime.  Patient not taking: Reported on 03/03/2024   Caleen Burgess BROCKS, MD  Active   isosorbide  mononitrate (IMDUR ) 60 MG 24 hr tablet [id number] No Take 1 tablet (60 mg total) by mouth daily. Swinyer, Rosaline HERO, NP Taking Active Self, Pharmacy Records  levocetirizine (XYZAL ) 5 MG tablet [id number] No Take 1 tablet (5 mg total) by mouth every evening. [name], DO Taking Active   levothyroxine  (SYNTHROID ) 200 MCG tablet [id number]  Take 1 tablet (200 mcg total) by mouth daily before breakfast. [name], DO  Active   lidocaine  (LIDODERM ) 5 % [id number] No Place 1 patch onto the skin every 12 (twelve) hours. Remove & Discard patch within 12 hours or as directed  Patient taking differently: Place 1 patch onto the skin daily as needed (pain). Remove & Discard patch within 12 hours or as directed   Debby Fidela CROME, NP Taking Active Self, Pharmacy Records  lidocaine  (XYLOCAINE ) 5 % ointment [id number] No Apply 1 Application topically in the morning and at bedtime. [provider] Taking Active Self, Pharmacy Records  metFORMIN  (GLUCOPHAGE -XR) 500 MG 24 hr tablet [id number] No Take 2 tablets by mouth twice daily Wendling, [name], DO Taking Active   Multiple Vitamins-Minerals (WOMENS 50+ MULTI VITAMIN PO) [id number] No Take 1 tablet by mouth in the morning and at bedtime. [provider] Taking Active Self, Pharmacy Records  mupirocin  ointment (BACTROBAN ) 2 % [id number] No Apply topically 2 (two) times daily. [name], DO Taking Active    naloxone  (NARCAN ) nasal spray 4 mg/0.1 mL [id number] No Place 1 spray into the nose as needed (opoid overdose). [name], DO Taking Active   nitroGLYCERIN  (NITROSTAT ) 0.4 MG SL tablet [id number] No Place 1 tablet (0.4 mg total) under the tongue every 5 (five) minutes as needed for chest pain. [name], DO Taking Active   nystatin  (MYCOSTATIN /NYSTOP ) powder [id number] No Apply 1 Application topically as needed.  [provider] Taking Active   omeprazole  (PRILOSEC) 40 MG capsule [id number]  Take 1 capsule (40 mg total) by mouth daily. [name], DO  Active   OneTouch Delica Lancets 33G [place] [id number] No Use to check blood glucose up to 4 times a day [name], DO Taking Active Self, Pharmacy Records  [facility] VERIO test strip [id number] No Use as instructed [name], DO Taking Active Self, Pharmacy Records  Oxycodone  HCl 20 MG TABS [id number]  Take 1 tablet (20 mg total) by mouth in the morning, at noon, and at bedtime. [name], DO  Active   Polyethyl Glyc-Propyl Glyc PF (SYSTANE PRESERVATIVE FREE) 0.4-0.3 % SOLN [id number] No Place 2 drops into both eyes in the morning and at bedtime. [provider] Taking Active Self, Pharmacy Records  pramipexole  (MIRAPEX ) 1 MG tablet [id number]  TAKE 1 TABLET BY MOUTH THREE TIMES DAILY [name], DO  Active   pregabalin  (LYRICA ) 50 MG capsule [id number]  TAKE 1 CAPSULE BY MOUTH THREE TIMES DAILY Raulkar, Sven SQUIBB, MD  Active   promethazine  (PHENERGAN ) 25 MG tablet [id number] No Take 1 tablet (25 mg total) by mouth every 8 (eight) hours as needed for vomiting or nausea.  Patient taking differently: Take 25 mg by mouth as needed for vomiting or nausea.   [name], DO Taking Active Self, Pharmacy Records  spironolactone  (ALDACTONE ) 50 MG tablet [id number] No Take 1 tablet (50 mg total) by mouth daily. [name], DO Taking Active    Vitamin D , Ergocalciferol , (DRISDOL ) 1.25 MG (50000 UNIT) CAPS capsule [id number]  Take 1 capsule by mouth once a week [name], DO  Active               Assessment/Plan:   Diabetes: - Currently uncontrolled; goal A1c <7%. Cardiorenal risk reduction is opportunities for improvement.. Blood pressure is at goal <130/80. LDL is not at goal.  - Reviewed goal A1c, goal fasting, and goal 2 hour post prandial glucose. Recommended to check glucose continuously with Continuous Glucose Monitor  - Recommend to continue / Restart now that oral intake has improved Farxiga , Basaglar , Trulicity , Humalog  U200 and metformin . - Discussed side effects of gastrointestinal upset/nausea; eating smaller meals, avoiding high-fat foods, and remaining upright after eating may reduce nausea. Discussed that overeating is a major trigger of nausea with this class of medications, as often times patients will start to feel full sooner and may need to decrease portion sizes from what they were previously accustomed to. , Advised on sick day rules (if a day with significantly reduced oral intake, serious vomiting, or diarrhea, hold SGLT2), Encouraged adequate hydration and genital hygiene.   Follow Up Plan: 4 weeks  Madelin Ray, PharmD Clinical Pharmacist [facility]  Population Health [phone]

## 2024-05-24 ENCOUNTER — Other Ambulatory Visit: Payer: Self-pay | Admitting: Family Medicine

## 2024-05-27 ENCOUNTER — Telehealth: Payer: Self-pay | Admitting: *Deleted

## 2024-05-27 ENCOUNTER — Encounter: Payer: Self-pay | Admitting: *Deleted
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
## 2024-05-29 ENCOUNTER — Encounter: Payer: Self-pay | Admitting: Family Medicine

## 2024-05-29 MED ORDER — ALBUTEROL SULFATE HFA 108 (90 BASE) MCG/ACT IN AERS: 2.0000 | INHALATION_SPRAY | RESPIRATORY_TRACT | 5 refills | Status: DC | PRN

## 2024-05-30 ENCOUNTER — Ambulatory Visit: Admitting: Family Medicine

## 2024-06-06 ENCOUNTER — Encounter: Payer: Self-pay | Admitting: Family Medicine

## 2024-06-06 ENCOUNTER — Telehealth (INDEPENDENT_AMBULATORY_CARE_PROVIDER_SITE_OTHER): Admitting: Family Medicine

## 2024-06-06 DIAGNOSIS — I5032 Chronic diastolic (congestive) heart failure: Secondary | ICD-10-CM | POA: Diagnosis not present

## 2024-06-06 DIAGNOSIS — Z794 Long term (current) use of insulin: Secondary | ICD-10-CM

## 2024-06-06 DIAGNOSIS — K529 Noninfective gastroenteritis and colitis, unspecified: Secondary | ICD-10-CM

## 2024-06-06 DIAGNOSIS — E1165 Type 2 diabetes mellitus with hyperglycemia: Secondary | ICD-10-CM | POA: Diagnosis not present

## 2024-06-06 DIAGNOSIS — Z7984 Long term (current) use of oral hypoglycemic drugs: Secondary | ICD-10-CM

## 2024-06-06 DIAGNOSIS — J4540 Moderate persistent asthma, uncomplicated: Secondary | ICD-10-CM

## 2024-06-06 MED ORDER — BETAMETHASONE DIPROPIONATE AUG 0.05 % EX CREA: TOPICAL_CREAM | CUTANEOUS | 1 refills | Status: DC

## 2024-06-06 MED ORDER — LEVOTHYROXINE SODIUM 200 MCG PO TABS: 200.0000 ug | ORAL_TABLET | Freq: Every day | ORAL | 0 refills | Status: DC

## 2024-06-06 MED ORDER — GABAPENTIN 400 MG PO CAPS: 400.0000 mg | ORAL_CAPSULE | Freq: Four times a day (QID) | ORAL | 3 refills | Status: AC

## 2024-06-06 MED ORDER — FUROSEMIDE 40 MG PO TABS: 40.0000 mg | ORAL_TABLET | Freq: Every day | ORAL | 2 refills | Status: AC

## 2024-06-06 MED ORDER — PROMETHAZINE HCL 25 MG PO TABS: 25.0000 mg | ORAL_TABLET | ORAL | 1 refills | Status: AC | PRN

## 2024-06-06 MED ORDER — PRAMIPEXOLE DIHYDROCHLORIDE 1 MG PO TABS: 1.0000 mg | ORAL_TABLET | Freq: Three times a day (TID) | ORAL | 1 refills | Status: DC

## 2024-06-06 NOTE — Progress Notes (Signed)
 CC: F/u  Subjective: Patient is a 63 y.o. female here for f/u. We are interacting via web portal for an electronic face-to-face visit. I verified patient's ID using 2 identifiers. Patient agreed to proceed with visit via this method. Patient is at home, I am at office. Patient and I are present for visit.   Patient was told to start taking her Advair daily for asthma.  She was using her inhaler frequently.  She continues to do this but has not started the Advair.  She just found the inhaler and plans to start using it.  Sugars are now under the 200 mark.  She started Trulicity  and is currently taking 1.5 mg weekly.  Compliant, possibly having adverse effect of nausea and cramping.  She has an appointment with the endocrinologist in 2 months.  She is also on Farxiga  10 mg daily, Basaglar  60 units at bedtime, short acting insulin  60 units with meals, resistant sliding scale, metformin  XR 1000 mg twice daily.   Past Medical History:  Diagnosis Date   Aortic stenosis    mild AS by echo 02/2021   Asthma    on daily RX and rescue inhaler (04/18/2018)   Chronic diastolic CHF (congestive heart failure) (HCC) 09/2015   Chronic lower back pain    Chronic neck pain    Chronic pain syndrome    Fentanyl  Patch   Colon polyps    Coronary artery disease    cath with normal LM, 30% LAD, 85% mid RCA and 95% distal RCA s/p PCI of the mid to distal RCA and now on DAPT with ASA and Ticagrelor .     Eczema    Excessive daytime sleepiness 11/26/2015   Fibromyalgia    Gallstones    GERD (gastroesophageal reflux disease)    Heart murmur    noted for the 1st time on 04/18/2018   History of blood transfusion 07/2010   S/P oophorectomy   History of gout    History of hiatal hernia 1980s   gone now (04/18/2018)   Hyperlipidemia    Hypertension    takes Metoprolol  and Enalapril  daily   Hypothyroidism    takes Synthroid  daily   IBS (irritable bowel syndrome)    Migraine    nothing in the 2000s  (04/18/2018)   Mixed connective tissue disease    NAFLD (nonalcoholic fatty liver disease)    Pneumonia    several times (04/18/2018)   PVC's (premature ventricular contractions)    noted on event monitor 02/2021   Rheumatoid arthritis (HCC)    hands, elbows, shoulders, probably knees (04/18/2018)   Scoliosis    Sjogren's syndrome    Sleep apnea    mild - does not use cpap   Spondylosis    Type II diabetes mellitus (HCC)    takes Metformin  and Hum R daily (04/18/2018)   Walker as ambulation aid    also uses wheelchair    Objective: No conversational dyspnea Age appropriate judgment and insight Nml affect and mood  Assessment and Plan: Acute gastroenteritis - Plan: promethazine  (PHENERGAN ) 25 MG tablet  Chronic heart failure with preserved ejection fraction (HCC) - Plan: furosemide  (LASIX ) 40 MG tablet  Moderate persistent asthma without complication  Type 2 diabetes mellitus with hyperglycemia, with long-term current use of insulin  (HCC)  Acute issue, refill phenergan .  Refill. Get back on Advair 100-50 micrograms 1 puff twice daily.  Has not required nebulizer but is still frequently using her rescue inhaler. Will cont dosage escalation ofbvTrulicity. Currently 1.5 mg/week.  Continue basal bolus insulin , Farxiga  10 mg daily, metformin  XR 1000 mg twice daily.  Continue monitoring sugars.  Appreciate endocrinology input. F/u in 6 mo for CPE or prn.  The patient voiced understanding and agreement to the plan.  [name] Mt [place], DO 06/06/24  11:16 AM

## 2024-06-09 ENCOUNTER — Ambulatory Visit (HOSPITAL_BASED_OUTPATIENT_CLINIC_OR_DEPARTMENT_OTHER): Admitting: General Surgery

## 2024-06-16 ENCOUNTER — Other Ambulatory Visit: Payer: Self-pay | Admitting: Nurse Practitioner

## 2024-06-16 ENCOUNTER — Other Ambulatory Visit: Payer: Self-pay | Admitting: Family Medicine

## 2024-06-18 NOTE — Patient Instructions (Addendum)
 Visit Information  Thank you for taking time to visit with me today. Please don't hesitate to contact me if I can be of assistance to you before our next scheduled appointment.  Your next care management appointment is by telephone on 07/27/24 at 2 pm  Please call the care guide team at [phone] if you need to cancel, schedule, or reschedule an appointment.   Please call the Suicide and Crisis Lifeline: 988 call the [place]  National Suicide Prevention Lifeline: 929-007-8208 or TTY: (725)397-7834 TTY 719-584-1715) to talk to a trained counselor call 1-800-273-TALK (toll free, 24 hour hotline) go to [facility] [address] [place] [phone]) call 911 if you are experiencing a Mental Health or Behavioral Health Crisis or need someone to talk to.  Delmas Faucett L. Ramonita, RN, BSN, CCM [place]  [facility], [facility] RN Care Manager Direct Dial: [phone]  Fax: [phone]

## 2024-06-20 ENCOUNTER — Telehealth: Payer: Self-pay | Admitting: Pharmacist

## 2024-06-20 ENCOUNTER — Telehealth: Payer: Self-pay

## 2024-06-20 ENCOUNTER — Other Ambulatory Visit: Payer: Self-pay | Admitting: Pharmacist

## 2024-06-20 MED ORDER — APIXABAN 5 MG PO TABS: 5.0000 mg | ORAL_TABLET | Freq: Two times a day (BID) | ORAL | 0 refills | Status: DC

## 2024-06-20 MED ORDER — SPIRONOLACTONE 50 MG PO TABS: 50.0000 mg | ORAL_TABLET | Freq: Every day | ORAL | 0 refills | Status: DC

## 2024-06-20 NOTE — Telephone Encounter (Signed)
 Patient contact made - please seen phone visit notes.

## 2024-06-20 NOTE — Telephone Encounter (Signed)
 Copied from CRM [id number]. Topic: General - Other >> Jun 20, 2024  2:27 PM Franky GRADE wrote: Reason for CRM: Patient returning a call she received from [facility], I attempted to contact Tammy but did not get a response. Patient was scheduled a for a phone call at 1:30 and for some reason her phone never rang but she noticed the miss call.

## 2024-06-20 NOTE — Telephone Encounter (Signed)
 Attempt was made to contact patient by phone today for follow up by Clinical Pharmacist regarding diabetes.  Unable to reach patient. Mailbox was full and unable to LM on VM.

## 2024-06-20 NOTE — Progress Notes (Signed)
 06/20/2024 Name: [name] MRN: [id number] DOB: Oct 18, 1960  Chief Complaint  Patient presents with   Diabetes   Medication Management    [name] is a 63 y.o. year old female who presented for a telephone visit.   They were referred to the pharmacist by their PCP for assistance in managing diabetes and complex medication management.    Subjective:  Care Team: Primary Care Provider: [name], DO ; Next Scheduled Visit: 12/05/2024  Medication Access/Adherence  Current Pharmacy:  [facility] [id number] [place] - [address] [place] [address] Phone: [phone] Fax: [phone]   Patient reports affordability concerns with their medications: No  Patient reports access/transportation concerns to their pharmacy: No  Patient reports adherence concerns with their medications:  Yes  - patient reports she has not been taking Eliquis  - she thought that Farxiga  was supposed to replace Eliquis . She also stopped spironolactone  - she is not sure why but thinks she needs to restart.      Diabetes:  Current medications:  metformin  ER 500mg  - 2 tablets = 1000mg  twice a day Farxiga  10mg  daily  Basaglar  / insulin  glargine - 20 units daily. (Has been able to lower dose from 60 units daily since she started Trulicity .  Humalog  U200 insulin  - 10 to 20 units 3 times a day  Trulicity  1.5mg  weekly - she has 2 more doses and she will then increase to 3mg  weekly for 4 weeks, then 4.5mg  weekly thereafter  Past medications tried: Victoza  cause severe, debilitating nausea.   Current glucose readings: Using Dex Com G7 sensors  Date of Download: 06/04/2024 to 06/17/2024 % Time CGM is active: 89% Average Glucose: 176mg /dL Glucose Management Indicator: 7.5 Glucose Variability: 33.2% (goal <36%) Time in Goal:  - Time in range 70-180: 54% - Time 181 to 250: 35% - Time above >250: 10% - Time below range: 1% Observed patterns:  post prandial and fasting blood glucose both have improved since Trulicity  was started.           Macrovascular and Microvascular Risk Reduction:  Statin? no; patient previously intolerant to statin therapy; ACEi/ARB? ACE cough with enalapril  Last urinary albumin/creatinine ratio:  Lab Results  Component Value Date   MICRALBCREAT 30 (H) 05/26/2022   MICRALBCREAT 9 02/03/2020   Last eye exam:  not recently Last foot exam: No foot exam found Tobacco Use:  Tobacco Use: Medium Risk (06/06/2024)   Patient History    Smoking Tobacco Use: Former    Smokeless Tobacco Use: Never    Passive Exposure: Past     Objective:  BP Readings from Last 3 Encounters:  03/26/24 128/78  03/11/24 118/70  02/26/24 120/82     Lab Results  Component Value Date   HGBA1C 8.0 (H) 09/10/2023    Lab Results  Component Value Date   CREATININE 1.09 (H) 10/01/2023   BUN 24 (H) 10/01/2023   NA 137 10/01/2023   K 3.5 10/01/2023   CL 92 (L) 10/01/2023   CO2 29 10/01/2023    Lab Results  Component Value Date   CHOL 147 05/08/2023   HDL 39.10 05/08/2023   LDLCALC 79 05/08/2023   LDLDIRECT 121.0 10/27/2020   TRIG 142.0 05/08/2023   CHOLHDL 4 05/08/2023    Medications Reviewed Today     Reviewed by [name], RPH-CPP (Pharmacist) on 06/20/24 at 1511  Med List Status: <None>   Medication Order Taking? Sig Documenting Provider Last Dose Status  Informant  acetaminophen  (TYLENOL ) 500 MG tablet [id number]  Take 1,000 mg by mouth as needed for mild pain (pain score 1-3). [provider]  Active Self, Pharmacy Records  albuterol  (PROVENTIL ) (2.5 MG/3ML) 0.083% nebulizer solution [id number]  USE 1 VIAL IN NEBULIZER EVERY 4 HOURS AS NEEDED FOR WHEEZING FOR SHORTNESS OF BREATH [name], [name], DO  Active Self, Pharmacy Records  albuterol  (VENTOLIN  HFA) 108 [id number] Base) MCG/ACT inhaler [id number]  Inhale 2 puffs into the lungs every 4 (four) hours as needed for wheezing or shortness  of breath. [name] [name], DO  Active   amoxicillin  (AMOXIL ) 500 MG tablet [id number]  Take twice daily for 5 days. [name] [name], DO  Active   apixaban  (ELIQUIS ) 5 MG TABS tablet [id number]  Take 1 tablet (5 mg total) by mouth 2 (two) times daily.  Patient not taking: Reported on 06/20/2024   [name] [name], DO  Active   augmented betamethasone  dipropionate (DIPROLENE -AF) 0.05 % cream [id number]  Apply twice daily as needed. [name] [name], DO  Active   Continuous Glucose Sensor (DEXCOM G7 SENSOR) [place] [id number]  USE TO CHECK FOR BLOOD SUGAR CONTINUOUSLY- CHANGE SENSOR EVERY 10 DAYS [name] [name], DO  Active   dapagliflozin  propanediol (FARXIGA ) 10 MG TABS tablet [id number]  Take 1 tablet (10 mg total) by mouth daily before breakfast. [name] [name], DO  Active   diclofenac  (FLECTOR ) 1.3 % 1 patch [id number]   [name] [name], DO  Active   diclofenac  (FLECTOR ) 1.3 % PTCH [id number]  PLACE 1 PATCH ONTO THE SKIN TWICE DAILY - WEAR PATCH UP TO 12 HOURS THEN REMOVE AND REPLACE WITH A NEW PATCH AS NEEDED [name] [name], DO  Active   diclofenac  Sodium (PENNSAID ) 2 % SOLN [id number]  Apply 2 Pump (40 mg total) topically 2 (two) times daily as needed. 1 application to back twice daily [name] [name], DO  Active   diltiazem  (CARDIZEM  CD) 360 MG 24 hr capsule [id number] Yes Take 1 capsule (360 mg total) by mouth daily. [name] [name], DO  Active   Dulaglutide  (TRULICITY ) 3 MG/0.5ML [name] [id number]  Inject 3 mg as directed once a week for 28 days.  Patient not taking: Reported on 06/20/2024   [name] [name], DO  Active   Dulaglutide  (TRULICITY ) 4.5 MG/0.5ML [name] [id number]  Inject 4.5 mg as directed once a week.  Patient not taking: Reported on 06/20/2024   [name] [name], DO  Active   EPINEPHrine  0.3 mg/0.3 mL IJ SOAJ injection [id number]  Inject 0.3 mg into the muscle as needed for anaphylaxis. [name] [name], DO  Active Self, Pharmacy Records           Med Note CARMIN SIN   Mon Oct 08, 2023 12:03 PM) PRN for Anaphylaxis/allergic reaction  ferrous sulfate  (FEROSUL) 325 (65 FE) MG tablet [id number] Yes Take 1 tablet (325 mg total) by mouth daily with breakfast. [name] [name], DO  Active   fluticasone  (FLONASE ) 50 MCG/ACT nasal spray [id number]  Place 2 sprays into both nostrils daily as needed for allergies or rhinitis. [name] [name], DO  Active   fluticasone -salmeterol (ADVAIR) 100-50 MCG/ACT AEPB [id number] Yes Inhale 1 puff into the lungs 2 (two) times daily. [name] [name], DO  Active   furosemide  (LASIX ) 40 MG tablet [id number] Yes Take 1 tablet (40 mg total) by mouth daily. [name] [name], DO  Active   gabapentin  (NEURONTIN ) 400 MG capsule [id number] Yes Take 1 capsule (400  mg total) by mouth 4 (four) times daily. TAKE 1 CAPSULE BY MOUTH IN THE MORNING AND 1 AT NOON AND 2 AT BEDTIME Strength: 400 mg [name], [name] Paul, DO  Active   gentamicin ointment (GARAMYCIN) 0.1 % [id number]  Apply 1 Application topically in the morning and at bedtime. [provider]  Active Self, Pharmacy Records  Glucagon , rDNA, (GLUCAGON  EMERGENCY) 1 MG KIT [id number]  Inject 1 mg into the vein daily as needed. [name] [name], DO  Active   Insulin  Glargine (BASAGLAR  KWIKPEN) 100 UNIT/ML [id number] Yes Inject 60 Units into the skin at bedtime.  Patient taking differently: Inject 20 Units into the skin at bedtime.   [name] [name], DO  Active   insulin  lispro (HUMALOG  KWIKPEN) 200 UNIT/ML KwikPen [id number] Yes Inject 60 Units into the skin 3 (three) times daily before meals. (Replaces Novolog  Flex Pen U100)  Patient taking differently: Inject 10-20 Units into the skin 3 (three) times daily before meals. (Replaces Novolog  Flex Pen U100)   [name], [name], DO  Active   Insulin  Syringe-Needle U-100 (INSULIN  SYRINGE .5CC/30GX5/16) 30G X  5/16 0.5 ML MISC [id number] Yes Take as directed with Novolog  inuslin.  15-20 units per day [name] [name], DO  Active   Insulin  Syringes, Disposable, U-100 0.5 ML MISC [id number] Yes 100 each by Does not apply route 4 (four) times daily -  before meals and at bedtime. [name] Burgess BROCKS, MD  Active   isosorbide  mononitrate (IMDUR ) 60 MG 24 hr tablet [id number] Yes Take 1 tablet by mouth once daily [name], [name] SAUNDERS, MD  Active   levocetirizine (XYZAL ) 5 MG tablet [id number] Yes Take 1 tablet (5 mg total) by mouth every evening. [name] [name], DO  Active   levothyroxine  (SYNTHROID ) 200 MCG tablet [id number] Yes Take 1 tablet (200 mcg total) by mouth daily before breakfast. [name] [name], DO  Active   lidocaine  (LIDODERM ) 5 % [id number] Yes Place 1 patch onto the skin every 12 (twelve) hours. Remove & Discard patch within 12 hours or as directed [name] Fidela CROME, NP  Active Self, Pharmacy Records  lidocaine  (XYLOCAINE ) 5 % ointment [id number]  Apply 1 Application topically in the morning and at bedtime. [provider]  Active Self, Pharmacy Records  metFORMIN  (GLUCOPHAGE -XR) 500 MG 24 hr tablet [id number] Yes Take 2 tablets by mouth twice daily [name], [name], DO  Active   Multiple Vitamins-Minerals (WOMENS 50+ MULTI VITAMIN PO) [id number]  Take 1 tablet by mouth in the morning and at bedtime. [provider]  Active Self, Pharmacy Records  mupirocin  ointment (BACTROBAN ) 2 % [id number]  Apply topically 2 (two) times daily. [name] [name], DO  Active   naloxone  (NARCAN ) nasal spray 4 mg/0.1 mL [id number]  Place 1 spray into the nose as needed (opoid overdose). [name] [name], DO  Active   nitroGLYCERIN  (NITROSTAT ) 0.4 MG SL tablet [id number]  Place 1 tablet (0.4 mg total) under the tongue every 5 (five) minutes as needed for chest pain. [name] [name], DO  Active   nystatin  (MYCOSTATIN /NYSTOP ) powder [id number]  Apply 1 Application topically  as needed. [provider]  Active   omeprazole  (PRILOSEC) 40 MG capsule [id number]  Take 1 capsule by mouth once daily [name] [name], DO  Active   OneTouch Delica Lancets 33G MISC [id number]  Use to check blood glucose up to 4 times a day [name] [name], DO  Active Self, Pharmacy Records  [facility] VERIO test strip [id number]  Use  as instructed [name] [name], DO  Active Self, Pharmacy Records  Oxycodone  HCl 20 MG TABS [id number] Yes Take 1 tablet (20 mg total) by mouth in the morning, at noon, and at bedtime. [name] [name], DO  Active   Polyethyl Glyc-Propyl Glyc PF (SYSTANE PRESERVATIVE FREE) 0.4-0.3 % SOLN [id number] Yes Place 2 drops into both eyes in the morning and at bedtime. [provider]  Active Self, Pharmacy Records  pramipexole  (MIRAPEX ) 1 MG tablet [id number] Yes Take 1 tablet (1 mg total) by mouth 3 (three) times daily. [name] [name], DO  Active   promethazine  (PHENERGAN ) 25 MG tablet [id number] Yes Take 1 tablet (25 mg total) by mouth as needed for vomiting or nausea. [name] [name], DO  Active   spironolactone  (ALDACTONE ) 50 MG tablet [id number]  Take 1 tablet (50 mg total) by mouth daily.  Patient not taking: Reported on 06/20/2024   [name] [name], DO  Active   Vitamin D , Ergocalciferol , (DRISDOL ) 1.25 MG (50000 UNIT) CAPS capsule [id number] Yes Take 1 capsule by mouth once a week [name] [name], DO  Active               Assessment/Plan:   Diabetes: - Currently uncontrolled; goal A1c <7%. Cardiorenal risk reduction is opportunities for improvement.. Blood pressure is at goal <130/80. LDL is not at goal.  - Reviewed goal A1c, goal fasting, and goal 2 hour post prandial glucose. Recommended to check glucose continuously with Continuous Glucose Monitor  - Recommend to continue Farxiga , Basaglar , Trulicity , Humalog  U200 and metformin . - Discussed side effects of gastrointestinal  upset/nausea; eating smaller meals, avoiding high-fat foods, and remaining upright after eating may reduce nausea. Discussed that overeating is a major trigger of nausea with this class of medications, as often times patients will start to feel full sooner and may need to decrease portion sizes from what they were previously accustomed to. , Advised on sick day rules (if a day with significantly reduced oral intake, serious vomiting, or diarrhea, hold SGLT2), Encouraged adequate hydration and genital hygiene.  - Reviewed why blood glucose from fingerstick and Continuous Glucose Monitor might not always be the same. Explained difference between blood glucose and interstitial glucose. Discussed checking blood glucose with fingerstick if it is low or she feels different than what the Continuous Glucose Monitor is showing.  - Reviewed how to treat a low blood glucose reading.   Medication Adherence:  - Discussed reasons for taking Eliquis  and spironolactone . Updated Rx for both medications and patient will restart.   Follow Up Plan: 4 weeks  Madelin Ray, PharmD Clinical Pharmacist [facility] Primary Care  Population Health [phone]

## 2024-06-20 NOTE — Telephone Encounter (Signed)
 Patient called back. Completed phone visit - see phone visit notes.

## 2024-06-26 ENCOUNTER — Encounter (HOSPITAL_BASED_OUTPATIENT_CLINIC_OR_DEPARTMENT_OTHER): Admitting: Internal Medicine

## 2024-06-27 ENCOUNTER — Other Ambulatory Visit: Payer: Self-pay | Admitting: Family Medicine

## 2024-07-01 ENCOUNTER — Telehealth: Payer: Self-pay | Admitting: Licensed Clinical Social Worker

## 2024-07-01 NOTE — Telephone Encounter (Signed)
 H&V Care Navigation CSW Progress Note  Clinical Social Worker received a call from pt [phone]) to f/u on scheduling an appt. She wasn't sure who to ask for an appt since she was dismissed but then not dismissed after appeal. She requests someone to call her tomorrow to schedule. Forwarded request to Damien Maid, clinical RN lead since previously collaborated around pt needs.  She said she'll be able to get to appt in person; I do note she hasn't completed any in person visits since hospital stay in June.   Patient is participating in a Managed Medicaid Plan:  No, [employer] commercial plan  SDOH Screenings   Food Insecurity: No Food Insecurity (02/26/2024)  Housing: Unknown (02/26/2024)  Transportation Needs: Unmet Transportation Needs (02/26/2024)  Utilities: Not At Risk (02/26/2024)  Alcohol  Screen: Low Risk  (09/10/2023)  Depression (PHQ2-9): High Risk (05/27/2024)  Financial Resource Strain: Low Risk  (01/02/2024)  Physical Activity: Unknown (09/10/2023)  Social Connections: Socially Isolated (09/10/2023)  Stress: No Stress Concern Present (09/10/2023)  Tobacco Use: Medium Risk (06/06/2024)  Health Literacy: Adequate Health Literacy (01/02/2024)     Marit Lark, MSW, LCSW Clinical Social Worker II [facility] Heart/Vascular Care Navigation  [phone]- work cell phone (preferred)

## 2024-07-02 ENCOUNTER — Ambulatory Visit: Payer: Self-pay

## 2024-07-02 NOTE — Telephone Encounter (Signed)
 FYI Only or Action Required?: FYI only for provider.  Patient was last seen in primary care on 06/06/2024 by [name], DO.  Called Nurse Triage reporting Diarrhea.  Symptoms began yesterday.  Interventions attempted: Rest, hydration, or home remedies.  Symptoms are: gradually improving.  Triage Disposition: See Physician Within 24 Hours  Patient/caregiver understands and will follow disposition?: Unsure RN offered appt for tomorrow. Patient declined scheduling, says that she is starting to feel better. She endorses some dehydration but is tolerating fluids. Pt says she will call in the morning if she feels she should be seen.  Triage Disposition: See Physician Within 24 Hours  Patient/caregiver understands and will follow disposition?: Unsure Reason for Disposition  [1] MODERATE diarrhea (e.g., 4-6 times / day more than normal) AND [2] present > 48 hours (2 days)  Answer Assessment - Initial Assessment Questions 1. DIARRHEA SEVERITY: How bad is the diarrhea? How many more stools have you had in the past 24 hours than normal?      Stayed on bedpan for 2 hours and had multiple episodes. Pt feels like she had at least 4-6 times  2. ONSET: When did the diarrhea begin?      Started last night  3. STOOL DESCRIPTION:  How loose or watery is the diarrhea? What is the stool color? Is there any blood or mucous in the stool?     Loose stools, pudding consistency  4. VOMITING: Are you also vomiting? If Yes, ask: How many times in the past 24 hours?      No vomiting, but extreme nausea  5. ABDOMEN PAIN: Are you having any abdomen pain? If Yes, ask: What does it feel like? (e.g., crampy, dull, intermittent, constant)      Crampy intermittent pain  6. ABDOMEN PAIN SEVERITY: If present, ask: How bad is the pain?  (e.g., Scale 1-10; mild, moderate, or severe)     No  7. ORAL INTAKE: If vomiting, Have you been able to drink liquids? How much liquids  have you had in the past 24 hours?     Yes, drinking gatorade  8. HYDRATION: Any signs of dehydration? (e.g., dry mouth [not just dry lips], too weak to stand, dizziness, new weight loss) When did you last urinate?     Yes, says that she is tinting  9. EXPOSURE: Have you traveled to a foreign country recently? Have you been exposed to anyone with diarrhea? Could you have eaten any food that was spoiled?     No  10. ANTIBIOTIC USE: Are you taking antibiotics now or have you taken antibiotics in the past 2 months?       No  11. OTHER SYMPTOMS: Do you have any other symptoms? (e.g., fever, blood in stool)       Low grade fever  12. PREGNANCY: Is there any chance you are pregnant? When was your last menstrual period?       no  Protocols used: [facility]

## 2024-07-02 NOTE — Telephone Encounter (Signed)
 1st attempt to contact patient, no answer, unable to leave VM  Message from [place] F sent at 07/02/2024  1:37 PM EDT  Reason for Triage: Pt called in and states she may have stomach bug, throwing up and diarrhea. States she has been on catheter since January. When advise that Dr. Frann was not available she would not let me do anything else nor transfer to NT. Just wanted to advise. States she'd call back if she threw up once more.

## 2024-07-03 NOTE — Telephone Encounter (Signed)
 Spoke with pt - stated diarrhea is gone and she is doing well. Nausea and fever are gone

## 2024-07-07 ENCOUNTER — Telehealth: Payer: Self-pay

## 2024-07-07 NOTE — Telephone Encounter (Signed)
 Copied from CRM [id number]. Topic: General - Other >> Jul 07, 2024 12:25 PM Aisha D wrote: Reason for CRM: Brittany,RN, with [facility] stated that the pt is experiencing symptoms of a UTI. Brittany stated that the pt has a catheter and has abdominal cramping, urine. Brittany also stated that the pt is having stool issues and stated that the pt's stool is softer than normal and some also got in the pt's catheter and that may be causing the cramping. Pt is also experiencing some nausea as well. Brittany's CB [phone].

## 2024-07-08 ENCOUNTER — Other Ambulatory Visit: Payer: Self-pay

## 2024-07-08 ENCOUNTER — Encounter (HOSPITAL_BASED_OUTPATIENT_CLINIC_OR_DEPARTMENT_OTHER): Admitting: General Surgery

## 2024-07-08 DIAGNOSIS — R8290 Unspecified abnormal findings in urine: Secondary | ICD-10-CM

## 2024-07-08 NOTE — Telephone Encounter (Signed)
 Called pt video visit schedule, because not come in person. Pt husband going to drop off urine sample.

## 2024-07-09 ENCOUNTER — Other Ambulatory Visit

## 2024-07-09 DIAGNOSIS — R8290 Unspecified abnormal findings in urine: Secondary | ICD-10-CM

## 2024-07-10 ENCOUNTER — Other Ambulatory Visit: Payer: Self-pay | Admitting: Family Medicine

## 2024-07-11 ENCOUNTER — Other Ambulatory Visit: Payer: Self-pay

## 2024-07-11 ENCOUNTER — Encounter: Payer: Self-pay | Admitting: Family Medicine

## 2024-07-11 ENCOUNTER — Telehealth (INDEPENDENT_AMBULATORY_CARE_PROVIDER_SITE_OTHER): Admitting: Family Medicine

## 2024-07-11 ENCOUNTER — Other Ambulatory Visit: Payer: Self-pay | Admitting: Family Medicine

## 2024-07-11 DIAGNOSIS — R300 Dysuria: Secondary | ICD-10-CM | POA: Diagnosis not present

## 2024-07-11 DIAGNOSIS — G8929 Other chronic pain: Secondary | ICD-10-CM

## 2024-07-11 DIAGNOSIS — E1165 Type 2 diabetes mellitus with hyperglycemia: Secondary | ICD-10-CM

## 2024-07-11 DIAGNOSIS — Z794 Long term (current) use of insulin: Secondary | ICD-10-CM

## 2024-07-11 DIAGNOSIS — J45909 Unspecified asthma, uncomplicated: Secondary | ICD-10-CM

## 2024-07-11 LAB — DRUG MONITORING PANEL 376104, URINE
Amphetamines: NEGATIVE ng/mL (ref <500)
Barbiturates: NEGATIVE ng/mL (ref <300)
Benzodiazepines: NEGATIVE ng/mL (ref <100)
Cocaine Metabolite: NEGATIVE ng/mL (ref <150)
Codeine: NEGATIVE ng/mL (ref <50)
Desmethyltramadol: NEGATIVE ng/mL (ref <100)
Hydrocodone: NEGATIVE ng/mL (ref <50)
Hydromorphone: NEGATIVE ng/mL (ref <50)
Morphine: NEGATIVE ng/mL (ref <50)
Norhydrocodone: NEGATIVE ng/mL (ref <50)
Noroxycodone: 3222 ng/mL — ABNORMAL HIGH (ref <50)
Opiates: NEGATIVE ng/mL (ref <100)
Oxycodone: >10000 ng/mL — ABNORMAL HIGH (ref <50)
Oxycodone: POSITIVE ng/mL — AB (ref <100)
Oxymorphone: 8852 ng/mL — ABNORMAL HIGH (ref <50)
Tramadol: NEGATIVE ng/mL (ref <100)

## 2024-07-11 LAB — DM TEMPLATE

## 2024-07-11 MED ORDER — TIRZEPATIDE 5 MG/0.5ML ~~LOC~~ SOAJ: 5.0000 mg | SUBCUTANEOUS | 0 refills | Status: DC

## 2024-07-11 MED ORDER — TIRZEPATIDE 7.5 MG/0.5ML ~~LOC~~ SOAJ: 7.5000 mg | SUBCUTANEOUS | 0 refills | Status: AC

## 2024-07-11 MED ORDER — TIRZEPATIDE 2.5 MG/0.5ML ~~LOC~~ SOAJ: 2.5000 mg | SUBCUTANEOUS | 0 refills | Status: AC

## 2024-07-11 MED ORDER — BASAGLAR KWIKPEN 100 UNIT/ML ~~LOC~~ SOPN
60.0000 [IU] | PEN_INJECTOR | Freq: Every day | SUBCUTANEOUS | 5 refills | Status: AC
Start: 2024-07-11 — End: ?

## 2024-07-11 MED ORDER — FLUCONAZOLE 150 MG PO TABS: ORAL_TABLET | ORAL | 0 refills | Status: DC

## 2024-07-11 MED ORDER — OXYCODONE HCL 20 MG PO TABS: 20.0000 mg | ORAL_TABLET | Freq: Three times a day (TID) | ORAL | 0 refills | Status: AC

## 2024-07-11 MED ORDER — CEFDINIR 300 MG PO CAPS: 300.0000 mg | ORAL_CAPSULE | Freq: Two times a day (BID) | ORAL | 0 refills | Status: AC

## 2024-07-11 NOTE — Addendum Note (Signed)
 Addended by: Maison Agrusa M on: 07/11/2024 12:03 PM   Modules accepted: Orders

## 2024-07-11 NOTE — Progress Notes (Signed)
 Chief Complaint  Patient presents with   Urinary Tract Infection    UTI    [name] is a 63 y.o. female here for possible UTI. We are interacting via web portal for an electronic face-to-face visit. I verified patient's ID using 2 identifiers. Patient agreed to proceed with visit via this method. Patient is at home, I am at office. Patient and I are present for visit.   Duration: 2 weeks. Symptoms: Dysuria, intensifying nausea diarrhea preceded this by a week, indwelling catheter Denies: hematuria, fever, and vomiting, vaginal discharge Hx of recurrent UTI? Yes Denies new sexual partners.  For 2 days after her Trulicity  injection, currently on 3 mg weekly, she has diarrhea and severe nausea.  If she misses a dose, she does okay.  No other adverse effects.  Past Medical History:  Diagnosis Date   Aortic stenosis    mild AS by echo 02/2021   Asthma    on daily RX and rescue inhaler (04/18/2018)   Chronic diastolic CHF (congestive heart failure) (HCC) 09/2015   Chronic lower back pain    Chronic neck pain    Chronic pain syndrome    Fentanyl  Patch   Colon polyps    Coronary artery disease    cath with normal LM, 30% LAD, 85% mid RCA and 95% distal RCA s/p PCI of the mid to distal RCA and now on DAPT with ASA and Ticagrelor .     Eczema    Excessive daytime sleepiness 11/26/2015   Fibromyalgia    Gallstones    GERD (gastroesophageal reflux disease)    Heart murmur    noted for the 1st time on 04/18/2018   History of blood transfusion 07/2010   S/P oophorectomy   History of gout    History of hiatal hernia 1980s   gone now (04/18/2018)   Hyperlipidemia    Hypertension    takes Metoprolol  and Enalapril  daily   Hypothyroidism    takes Synthroid  daily   IBS (irritable bowel syndrome)    Migraine    nothing in the 2000s (04/18/2018)   Mixed connective tissue disease    NAFLD (nonalcoholic fatty liver disease)    Pneumonia    several times (04/18/2018)   PVC's  (premature ventricular contractions)    noted on event monitor 02/2021   Rheumatoid arthritis (HCC)    hands, elbows, shoulders, probably knees (04/18/2018)   Scoliosis    Sjogren's syndrome    Sleep apnea    mild - does not use cpap   Spondylosis    Type II diabetes mellitus (HCC)    takes Metformin  and Hum R daily (04/18/2018)   Walker as ambulation aid    also uses wheelchair    Obj No conversational dyspnea Age appropriate judgment and insight Nml affect and mood  Dysuria  Type 2 diabetes mellitus with hyperglycemia, with long-term current use of insulin  (HCC)  Other chronic pain - Plan: Oxycodone  HCl 20 MG TABS  Will treat empirically with 7 days of Omnicef given she has a catheter present.  Diflucan  as needed.  Stay hydrated.  Seek immediate care if pt starts to develop fevers, new/worsening symptoms, uncontrollable N/V. Refill insulin .  Adverse effect of chronic medication.  Stop Trulicity  as this likely cause the diarrhea to lead to a UTI.  Start Mounjaro 2.5 mg weekly.  Continue other chronic medication. F/u prn. The patient voiced understanding and agreement to the plan.  [name] [place], DO 07/11/24 11:23 AM

## 2024-07-16 ENCOUNTER — Telehealth: Payer: Self-pay

## 2024-07-16 NOTE — Telephone Encounter (Signed)
 Could try some Pepcid  alongside it. F/u if not improving in next few weeks.

## 2024-07-16 NOTE — Telephone Encounter (Signed)
 Pt called stated the medication for her Thalia (reflux ) not working and needs something stronger sent in.

## 2024-07-17 NOTE — Telephone Encounter (Signed)
 Patient notified to try the Pepcid  with omeprazole  and to follow up if no better.

## 2024-07-18 ENCOUNTER — Other Ambulatory Visit: Payer: Self-pay | Admitting: Pharmacist

## 2024-07-18 ENCOUNTER — Telehealth (HOSPITAL_BASED_OUTPATIENT_CLINIC_OR_DEPARTMENT_OTHER): Payer: Self-pay | Admitting: Pharmacist

## 2024-07-18 NOTE — Telephone Encounter (Signed)
 Attempt was made to contact patient by phone today for follow up by Clinical Pharmacist regarding type 2 diabetes and medication management.  Unable to reach patient. LM on VM with my contact number [phone].

## 2024-07-29 ENCOUNTER — Other Ambulatory Visit: Payer: Self-pay

## 2024-07-29 DIAGNOSIS — Z139 Encounter for screening, unspecified: Secondary | ICD-10-CM

## 2024-07-29 NOTE — Patient Outreach (Signed)
 Complex Care Management   Visit Note  07/29/2024  Name:  [name] MRN: [id number] DOB: 03-08-1961  Situation: Referral received for Complex Care Management related to Diabetes with Complications and limited mobility I obtained verbal consent from Patient.  Visit completed with Patient  on the phone  Background:   Past Medical History:  Diagnosis Date   Aortic stenosis    mild AS by echo 02/2021   Asthma    on daily RX and rescue inhaler (04/18/2018)   Chronic diastolic CHF (congestive heart failure) (HCC) 09/2015   Chronic lower back pain    Chronic neck pain    Chronic pain syndrome    Fentanyl  Patch   Colon polyps    Coronary artery disease    cath with normal LM, 30% LAD, 85% mid RCA and 95% distal RCA s/p PCI of the mid to distal RCA and now on DAPT with ASA and Ticagrelor .     Eczema    Excessive daytime sleepiness 11/26/2015   Fibromyalgia    Gallstones    GERD (gastroesophageal reflux disease)    Heart murmur    noted for the 1st time on 04/18/2018   History of blood transfusion 07/2010   S/P oophorectomy   History of gout    History of hiatal hernia 1980s   gone now (04/18/2018)   Hyperlipidemia    Hypertension    takes Metoprolol  and Enalapril  daily   Hypothyroidism    takes Synthroid  daily   IBS (irritable bowel syndrome)    Migraine    nothing in the 2000s (04/18/2018)   Mixed connective tissue disease    NAFLD (nonalcoholic fatty liver disease)    Pneumonia    several times (04/18/2018)   PVC's (premature ventricular contractions)    noted on event monitor 02/2021   Rheumatoid arthritis (HCC)    hands, elbows, shoulders, probably knees (04/18/2018)   Scoliosis    Sjogren's syndrome    Sleep apnea    mild - does not use cpap   Spondylosis    Type II diabetes mellitus (HCC)    takes Metformin  and Hum R daily (04/18/2018)   Walker as ambulation aid    also uses wheelchair    Assessment: Patient Reported Symptoms:  Cognitive Cognitive  Status: Able to follow simple commands, Alert and oriented to person, place, and time, Normal speech and language skills, Struggling with memory recall Cognitive/Intellectual Conditions Management [RPT]: None reported or documented in medical history or problem list   Health Maintenance Behaviors: Annual physical exam, Exercise Health Facilitated by: Pain control, Rest  Neurological Neurological Review of Symptoms: Weakness Neurological Management Strategies: Routine screening Neurological Comment: Patient reports waiting on neurologist referral until she is able to safetly transfer to get to appointments  HEENT HEENT Symptoms Reported: Not assessed      Cardiovascular Cardiovascular Symptoms Reported: No symptoms reported Does patient have uncontrolled Hypertension?: No Cardiovascular Management Strategies: Exercise, Medication therapy, Routine screening Do You Have a Working Readable Scale?: Yes (Has scale but is not checking regularly due to mobility issues) Cardiovascular Comment: Patient reports she has requested an appointment with cardiology  Respiratory Respiratory Symptoms Reported: Shortness of breath, Wheezing Other Respiratory Symptoms: Patient admits to only using Advair once a day because she forgets the nighttime dose. She reports last Albuterol  use this morning. Patient reports changes in breathing are typical for her this time of year with change of seasons Additional Respiratory Details: Patient reports waiting on pulmonologist referral until she is able  to safetly transfer to get to appointments Respiratory Management Strategies: Adequate rest, Breathing exercise, Medication therapy, Routine screening  Endocrine Endocrine Symptoms Reported: No symptoms reported Is patient diabetic?: Yes Is patient checking blood sugars at home?: Yes List most recent blood sugar readings, include date and time of day: Dexcom G7, 193 this morning. Patient notes she had a gatorade last night,  which she attributes to increased blood sugar. Patient reports fasting blood sugar typically 80-145 Endocrine Comment: Advised patient of missed calls from pharmacist per chart review. Patient reports she has been having trouble with her phone not ringing. Advised to call back for diabetic management  Gastrointestinal Gastrointestinal Symptoms Reported: Change in appetite, Nausea Additional Gastrointestinal Details: Patient reports my appetite has dwindled with initiation of Mounjaro. She also reports nausea related to medication, relieved with phenergan . Patient reports diarrhea and nausea not as bad with switch from Trulicity  to Mounjaro Gastrointestinal Management Strategies: Medication therapy, Coping strategies    Genitourinary Genitourinary Symptoms Reported: No symptoms reported Additional Genitourinary Details: Note per chart review patient had recent televisit with PCP regarding UTI symptoms. She reports she has completed antibiotics as ordered and denies further symptoms related to UTI Genitourinary Management Strategies: Catheter, indwelling, Medication therapy Genitourinary Comment: Patient notes catheter is due to be changed this week. She reports a home health nurse comes once a week  Integumentary Integumentary Symptoms Reported: Wound Additional Integumentary Details: Patient reports wound left foot looks good. She reports wounds on her heel and arch are about the size of a pea. Patient reports doing epsom salt foot soaks and changing dressing daily. 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 Patient reports she would like to get prosthesis but needs to lose weight first. Additional Musculoskeletal Details:  Patient reports doing exercises daily. She reports she continues to spend most of her time in the bed. She continues to work with PT with goal to be able to transfer from bed to wheelchair/wheelchair to car so she will be able to attend in person provider appointments Musculoskeletal Management Strategies: Adequate rest, Coping strategies, Routine screening, Exercise, Medical device Falls in the past year?: Yes Number of falls in past year: 2 or more Was there an injury with Fall?: No Fall Risk Category Calculator: 2 Patient Fall Risk Level: Moderate Fall Risk Patient at Risk for Falls Due to: History of fall(s), Impaired balance/gait, Impaired mobility, Orthopedic patient, Other (Comment) (R BKA) Fall risk Follow up: Falls evaluation completed, Education provided, Falls prevention discussed  Psychosocial Psychosocial Symptoms Reported: Depression - if selected complete PHQ 2-9 Behavioral Management Strategies: Adequate rest, Coping strategies, Counseling, Support system Major Change/Loss/Stressor/Fears (CP): Medical condition, self Techniques to Cope with Loss/Stress/Change: Diversional activities Quality of Family Relationships: helpful, involved, supportive    07/29/2024    PHQ2-9 Depression Screening   Little interest or pleasure in doing things Not at all  Feeling down, depressed, or hopeless Several days  PHQ-2 - Total Score 1  Trouble falling or staying asleep, or sleeping too much    Feeling tired or having little energy    Poor appetite or overeating     Feeling bad about yourself - or that you are a failure or have let yourself or your family down    Trouble concentrating on things, such as reading the newspaper or watching television  Moving or speaking so slowly that other people could have noticed.  Or the opposite - being so fidgety or restless that you have been moving around a lot more than usual    Thoughts that you would be better off dead, or hurting yourself in some  way    PHQ2-9 Total Score    If you checked off any problems, how difficult have these problems made it for you to do your work, take care of things at home, or get along with other people    Depression Interventions/Treatment      There were no vitals filed for this visit. Pain Scale: 0-10 Pain Score: 5  Pain Type: Chronic pain Pain Location: Back Pain Orientation: Lower  Medications Reviewed Today     Reviewed by Arno Rosaline SQUIBB, RN (Registered Nurse) on 07/29/24 at 1440  Med List Status: <None>   Medication Order Taking? Sig Documenting Provider Last Dose Status Informant  acetaminophen  (TYLENOL ) 500 MG tablet [id number] Yes Take 1,000 mg by mouth as needed for mild pain (pain score 1-3). [provider]  Active Self, Pharmacy Records  albuterol  (PROVENTIL ) (2.5 MG/3ML) 0.083% nebulizer solution [id number] Yes USE 1 VIAL IN NEBULIZER EVERY 4 HOURS AS NEEDED FOR WHEEZING FOR SHORTNESS OF BREATH Wendling, [name], DO  Active Self, Pharmacy Records  albuterol  (VENTOLIN  HFA) 108 [id number] Base) MCG/ACT inhaler [id number] Yes Inhale 2 puffs into the lungs every 4 (four) hours as needed for wheezing or shortness of breath. [name], DO  Active   amoxicillin  (AMOXIL ) 500 MG tablet [id number]  Take twice daily for 5 days. [name], DO  Consider Medication Status and Discontinue (Completed Course)   apixaban  (ELIQUIS ) 5 MG TABS tablet [id number] Yes Take 1 tablet (5 mg total) by mouth 2 (two) times daily. [name], DO  Active   augmented betamethasone  dipropionate (DIPROLENE -AF) 0.05 % cream [id number] Yes Apply twice daily as needed. [name], DO  Active   Continuous Glucose Sensor (DEXCOM G7 SENSOR) [place] [id number] Yes USE TO CHECK FOR BLOOD SUGAR CONTINUOUSLY- CHANGE SENSOR EVERY 10 DAYS [name], DO  Active   dapagliflozin  propanediol (FARXIGA ) 10 MG TABS tablet [id number] Yes Take 1 tablet (10 mg total) by mouth  daily before breakfast. [name], DO  Active   diclofenac  (FLECTOR ) 1.3 % 1 patch [id number]   [name], DO  Active   diclofenac  (FLECTOR ) 1.3 % PT[facility] [id number] Yes PLACE 1 PATCH ONTO THE SKIN TWICE DAILY - WEAR PATCH UP TO 12 HOURS THEN REMOVE AND REPLACE WITH A NEW PATCH AS NEEDED [name], DO  Active   diclofenac  Sodium (PENNSAID ) 2 % SOLN [id number] Yes Apply 2 Pump (40 mg total) topically 2 (two) times daily as needed. 1 application to back twice daily [name], DO  Active   diltiazem  (CARDIZEM  CD) 360 MG 24 hr capsule [id number] Yes Take 1 capsule (360 mg total) by mouth daily. [name], DO  Active   EPINEPHrine  0.3 mg/0.3 mL IJ SOAJ injection [id number] Yes Inject 0.3 mg into the muscle as needed for anaphylaxis. [name], DO  Active   ferrous sulfate  (FEROSUL) 325 (65 FE) MG tablet [id number] Yes Take 1 tablet (325 mg total) by mouth daily with breakfast. [name], DO  Active   fluconazole  (DIFLUCAN ) 150 MG tablet [id number] Yes Take 1 tab, repeat in 72 hours if no improvement. [name], DO  Active   fluticasone  (FLONASE )  50 MCG/ACT nasal spray [id number] Yes Place 2 sprays into both nostrils daily as needed for allergies or rhinitis. [name], DO  Active   fluticasone -salmeterol (ADVAIR) 100-50 MCG/ACT AEPB [id number] Yes Inhale 1 puff into the lungs 2 (two) times daily.  Patient taking differently: Inhale 1 puff into the lungs daily.   [name], DO  Active   furosemide  (LASIX ) 40 MG tablet [id number] Yes Take 1 tablet (40 mg total) by mouth daily. [name], DO  Active   gabapentin  (NEURONTIN ) 400 MG capsule [id number] Yes Take 1 capsule (400 mg total) by mouth 4 (four) times daily. TAKE 1 CAPSULE BY MOUTH IN THE MORNING AND 1 AT NOON AND 2 AT BEDTIME Strength: 400 mg Wendling, Nicholas Paul, DO  Active   gentamicin ointment (GARAMYCIN) 0.1 %  [id number] Yes Apply 1 Application topically in the morning and at bedtime. [provider]  Active Self, Pharmacy Records  Glucagon , rDNA, (GLUCAGON  EMERGENCY) 1 MG KIT [id number] Yes Inject 1 mg into the vein daily as needed. [name], DO  Active   Insulin  Glargine (BASAGLAR  [facility]) 100 UNIT/ML [id number] Yes Inject 60 Units into the skin at bedtime. [name], DO  Active   insulin  lispro (HUMALOG  KWIKPEN) 200 UNIT/ML KwikPen [id number] Yes Inject 60 Units into the skin 3 (three) times daily before meals. (Replaces Novolog  Flex Pen U100)  Patient taking differently: Inject 45-60 Units into the skin 3 (three) times daily before meals. (Replaces Novolog  Flex Pen U100)   Wendling, [name], DO  Active   Insulin  Syringe-Needle U-100 (INSULIN  SYRINGE .5CC/30GX5/16) 30G X 5/16 0.5 ML MISC [id number] Yes Take as directed with Novolog  inuslin.  15-20 units per day [name], DO  Active   Insulin  Syringes, Disposable, U-100 0.5 ML MISC [id number] Yes 100 each by Does not apply route 4 (four) times daily -  before meals and at bedtime. Caleen Burgess BROCKS, MD  Active   isosorbide  mononitrate (IMDUR ) 60 MG 24 hr tablet [id number]  Take 1 tablet by mouth once daily  Patient not taking: Reported on 07/29/2024   Shlomo Wilbert SAUNDERS, MD  Active   levocetirizine (XYZAL ) 5 MG tablet [id number] Yes Take 1 tablet (5 mg total) by mouth every evening. [name], DO  Active   levothyroxine  (SYNTHROID ) 200 MCG tablet [id number] Yes Take 1 tablet (200 mcg total) by mouth daily before breakfast. [name], DO  Active   lidocaine  (LIDODERM ) 5 % [id number] Yes Place 1 patch onto the skin every 12 (twelve) hours. Remove & Discard patch within 12 hours or as directed Debby Fidela CROME, NP  Active Self, Pharmacy Records  lidocaine  (XYLOCAINE ) 5 % ointment [id number] Yes Apply 1 Application topically in the morning and at bedtime. [provider]  Active  Self, Pharmacy Records  metFORMIN  (GLUCOPHAGE -XR) 500 MG 24 hr tablet [id number] Yes Take 2 tablets by mouth twice daily Wendling, [name], DO  Active   Multiple Vitamins-Minerals (WOMENS 50+ MULTI VITAMIN PO) [id number] Yes Take 1 tablet by mouth in the morning and at bedtime. [provider]  Active Self, Pharmacy Records  mupirocin  ointment (BACTROBAN ) 2 % [id number] Yes Apply topically 2 (two) times daily. [name], DO  Active   naloxone  (NARCAN ) nasal spray 4 mg/0.1 mL [id number] Yes Place 1 spray into the nose as needed (opoid overdose). [name], DO  Active   nitroGLYCERIN  (NITROSTAT ) 0.4 MG SL tablet [id number] Yes Place 1 tablet (0.4 mg total)  under the tongue every 5 (five) minutes as needed for chest pain. [name], DO  Active   nystatin  (MYCOSTATIN /NYSTOP ) powder [id number] Yes Apply 1 Application topically as needed. [provider]  Active   omeprazole  (PRILOSEC) 40 MG capsule [id number] Yes Take 1 capsule by mouth once daily [name], DO  Active   OneTouch Delica Lancets 33G MISC [id number] Yes Use to check blood glucose up to 4 times a day [name], DO  Active Self, Pharmacy Records  [facility] VERIO test strip [id number] Yes Use as instructed [name], DO  Active Self, Pharmacy Records  Oxycodone  HCl 20 MG TABS [id number] Yes Take 1 tablet (20 mg total) by mouth in the morning, at noon, and at bedtime. [name], DO  Active   Polyethyl Glyc-Propyl Glyc PF (SYSTANE PRESERVATIVE FREE) 0.4-0.3 % SOLN [id number] Yes Place 2 drops into both eyes in the morning and at bedtime. [provider]  Active Self, Pharmacy Records  pramipexole  (MIRAPEX ) 1 MG tablet [id number] Yes Take 1 tablet (1 mg total) by mouth 3 (three) times daily. [name], DO  Active   promethazine  (PHENERGAN ) 25 MG tablet [id number] Yes Take 1 tablet (25 mg total) by mouth as needed for  vomiting or nausea. [name], DO  Active   spironolactone  (ALDACTONE ) 50 MG tablet [id number] Yes Take 1 tablet (50 mg total) by mouth daily. [name], DO  Active   tirzepatide (MOUNJARO) 2.5 MG/0.5ML Pen [id number] Yes Inject 2.5 mg into the skin once a week for 28 days. [name], DO  Active   tirzepatide (MOUNJARO) 5 MG/0.5ML Pen [id number]  Inject 5 mg into the skin once a week for 28 days. [name], DO  Active   tirzepatide [facility]) 7.5 MG/0.5ML Pen [id number]  Inject 7.5 mg into the skin once a week for 28 days. [name], DO  Active   Vitamin D , Ergocalciferol , (DRISDOL ) 1.25 MG (50000 UNIT) CAPS capsule [id number] Yes Take 1 capsule by mouth once a week [name], DO  Active             Recommendation:   Specialty provider follow-up pharmacist - patient to call and reschedule missed visit Continue Current Plan of Care  Follow Up Plan:   Telephone follow up appointment date/time:  08/26/24 at 1 PM Referral to BSW  Rosaline Finlay, RN MSN [place]  [facility] RN Care Manager Direct Dial: [phone]  Fax: [phone]

## 2024-07-29 NOTE — Patient Instructions (Signed)
 Visit Information  Thank you for taking time to visit with me today. Please don't hesitate to contact me if I can be of assistance to you before our next scheduled appointment.  Your next care management appointment is by telephone on 08/26/24 at 1 PM  Please call the care guide team at [phone] if you need to cancel, schedule, or reschedule an appointment.   Please call the Suicide and Crisis Lifeline: 988 call 1-800-273-TALK (toll free, 24 hour hotline) if you are experiencing a Mental Health or Behavioral Health Crisis or need someone to talk to.  Rosaline Finlay, RN MSN [place]  [facility] RN Care Manager Direct Dial: [phone]  Fax: [phone]

## 2024-08-01 ENCOUNTER — Telehealth: Payer: Self-pay

## 2024-08-01 NOTE — Telephone Encounter (Signed)
 Copied from CRM [id number]. Topic: Clinical - Medication Question >> Aug 01, 2024 10:29 AM Ahlexyia S wrote: Reason for CRM: Pt called in requesting to speak to Tammy Eckard. Per pt chart Tammy attempted to reach pt but she was unable to reach pt. I left Tammy a VM with pt contact info and what pt was requesting. Informed pt with info and also advised her that Tammy will give her a call when she is available.

## 2024-08-04 ENCOUNTER — Telehealth: Payer: Self-pay | Admitting: Pharmacist

## 2024-08-04 ENCOUNTER — Other Ambulatory Visit: Payer: Self-pay | Admitting: Family Medicine

## 2024-08-04 NOTE — Progress Notes (Signed)
 Patient called to discuss refill for Humalog  U200. She would like to get a 90 day supply because she is concerned she might lose her insurance at the end of 2025.  Rx was already sent for 27 day supply today. I would recommend we try to fill a 90 day supply at the end of 2025 so that it will last further into 2026.   Also discussed her recently Continuous Glucose Monitor report. Blood glucose is improved since she started Mounjaro . She is currently taking 5mg  weekly but will increase to 7.5mg  weekly for the next dose.  She does mention that she has had some constipation.  Recommended she increase dietary fiber from whole grains, fruits and vegetables.  She can also add fiber supplement like Benefiber daily. If she continues to have constipation she can try Miralax  - 0.5 to 1 cupful mixed with beverage of choice daily as needed.   Plan to follow up in 2 weeks.    CGM Documentation:  Days Worn: 14 (recommend 14 days) % Time CGM is active: 97% (goal >=70%) Average Glucose: 186 mg/dL Glucose Management Indicator: 7.8% Glucose Variability: 29.7% (goal <36%) Time in Range:  - Time above range >250: 14% (typical goal: <5%) - Time above range 181-250: 40% (typical goal <20%) - Time in range 70-180 46% (typical goal >=70%) - Time below range 54-69: 0% (typical goal <4%) - Time below range: 0% (typical goal <1%)

## 2024-08-04 NOTE — Telephone Encounter (Signed)
 See phone note from all 08/04/2024

## 2024-08-06 ENCOUNTER — Telehealth: Payer: Self-pay | Admitting: Licensed Clinical Social Worker

## 2024-08-11 ENCOUNTER — Other Ambulatory Visit: Payer: Self-pay | Admitting: Licensed Clinical Social Worker

## 2024-08-14 ENCOUNTER — Telehealth: Payer: Self-pay | Admitting: Family Medicine

## 2024-08-14 NOTE — Telephone Encounter (Signed)
 Noted

## 2024-08-14 NOTE — Telephone Encounter (Signed)
 Copied from CRM [id number]. Topic: Medical Record Request - Provider/Facility Request >> Aug 14, 2024  2:01 PM Suzen RAMAN wrote: Reason for CRM: [facility] called to check on the status of medical supply request sent to the office. No indication of request being received. Caller will resend information, fax number confirmed.

## 2024-08-15 ENCOUNTER — Other Ambulatory Visit: Payer: Self-pay | Admitting: Family Medicine

## 2024-08-15 NOTE — Telephone Encounter (Signed)
Paperwork placed in provider bin for review.

## 2024-08-17 ENCOUNTER — Other Ambulatory Visit: Payer: Self-pay | Admitting: Family Medicine

## 2024-08-18 ENCOUNTER — Other Ambulatory Visit: Payer: Self-pay | Admitting: Family Medicine

## 2024-08-18 ENCOUNTER — Other Ambulatory Visit

## 2024-08-18 ENCOUNTER — Encounter: Admitting: Physical Medicine and Rehabilitation

## 2024-08-18 DIAGNOSIS — J4541 Moderate persistent asthma with (acute) exacerbation: Secondary | ICD-10-CM

## 2024-08-18 MED ORDER — DILTIAZEM HCL ER COATED BEADS 360 MG PO CP24: 360.0000 mg | ORAL_CAPSULE | Freq: Every day | ORAL | 0 refills | Status: AC

## 2024-08-18 MED ORDER — FLUTICASONE-SALMETEROL 100-50 MCG/ACT IN AEPB: 1.0000 | INHALATION_SPRAY | Freq: Two times a day (BID) | RESPIRATORY_TRACT | 0 refills | Status: AC

## 2024-08-18 MED ORDER — TIRZEPATIDE 7.5 MG/0.5ML ~~LOC~~ SOAJ: 7.5000 mg | SUBCUTANEOUS | 0 refills | Status: AC

## 2024-08-18 MED ORDER — HUMALOG KWIKPEN 200 UNIT/ML ~~LOC~~ SOPN: 60.0000 [IU] | PEN_INJECTOR | Freq: Three times a day (TID) | SUBCUTANEOUS | 0 refills | Status: AC

## 2024-08-18 MED ORDER — ISOSORBIDE MONONITRATE ER 60 MG PO TB24: 60.0000 mg | ORAL_TABLET | Freq: Every day | ORAL | 0 refills | Status: AC

## 2024-08-18 MED ORDER — METFORMIN HCL ER 500 MG PO TB24: 1000.0000 mg | ORAL_TABLET | Freq: Two times a day (BID) | ORAL | 0 refills | Status: AC

## 2024-08-18 MED ORDER — OMEPRAZOLE 40 MG PO CPDR: 40.0000 mg | DELAYED_RELEASE_CAPSULE | Freq: Every day | ORAL | 0 refills | Status: AC

## 2024-08-18 MED ORDER — PRAMIPEXOLE DIHYDROCHLORIDE 1 MG PO TABS: 1.0000 mg | ORAL_TABLET | Freq: Three times a day (TID) | ORAL | 0 refills | Status: AC

## 2024-08-18 NOTE — Progress Notes (Signed)
 08/18/2024 Name: [name] MRN: [id number] DOB: 06/30/1961  Chief Complaint  Patient presents with   Diabetes   Medication Management    [name] is a 63 y.o. year old female who presented for a telephone visit.   They were referred to the pharmacist by their PCP for assistance in managing diabetes and complex medication management.    Subjective:  Care Team: Primary Care Provider: [name], DO ; Next Scheduled Visit: 12/05/2024  Medication Access/Adherence  Current Pharmacy:  [facility] [address], [place] - [address] [place] [address] [place] [address] Phone: [phone] Fax: [phone]   Patient reports affordability concerns with their medications: No  Patient reports access/transportation concerns to their pharmacy: No  Patient reports adherence concerns with their medications:  Yes  - patient reports she has not been taking isosorbide  because she has not been able to see her cardiologist. She was hospitalized for an extended period of time and is having difficulty regaining her mobility. She is getting PT at home once week. She asked if PCP can write an Rx for isosorbide .   She also mentioned that she might not have insurance in 2026. Currently has an [employer] plan. She has starting applying for Medicaid.  Patient states she is also working with Care Management nurse, Tobias Moose on possible enrollment with Medicaid or [facility] and   Diabetes:  Current medications:  metformin  ER 500mg  - 2 tablets = 1000mg  twice a day Farxiga  10mg  daily  Basaglar  / insulin  glargine - 60 units daily Humalog  U200 insulin  - 20 to 60 units 3 times a day per patient.   Mounjaro  5mg  weekly   Past medications tried: Victoza  caused severe, debilitating nausea.   Current glucose readings: Using Dex Com G7 sensors  Date of Download: 08/05/2024 to 08/18/2024 % Time CGM is active: 95% Average Glucose: 190 mg/dL Glucose Management  Indicator: 7.9 Glucose Variability: 28.5% (goal <36%) Time in Goal:  - Time in range 70-180: 43% - Time 181 to 250: 43% - Time above >250: 13% - Time below range: 2% Observed patterns: post prandial and fasting blood glucose both have improved since changed to Mounjaro . She has had about 4 lows in the last 2 weeks but where short            Macrovascular and Microvascular Risk Reduction:  Statin? no; patient previously intolerant to statin therapy; ACEi/ARB? ACE cough with enalapril  Last urinary albumin/creatinine ratio:  Lab Results  Component Value Date   MICRALBCREAT 30 (H) 05/26/2022   MICRALBCREAT 9 02/03/2020   Last eye exam:  not recently Last foot exam: No foot exam found Tobacco Use:  Tobacco Use: Medium Risk (07/11/2024)   Patient History    Smoking Tobacco Use: Former    Smokeless Tobacco Use: Never    Passive Exposure: Past     Objective:  BP Readings from Last 3 Encounters:  03/26/24 128/78  03/11/24 118/70  02/26/24 120/82     Lab Results  Component Value Date   HGBA1C 8.0 (H) 09/10/2023    Lab Results  Component Value Date   CREATININE 1.09 (H) 10/01/2023   BUN 24 (H) 10/01/2023   NA 137 10/01/2023   K 3.5 10/01/2023   CL 92 (L) 10/01/2023   CO2 29 10/01/2023    Lab Results  Component Value Date   CHOL 147 05/08/2023   HDL 39.10 05/08/2023   LDLCALC 79 05/08/2023   LDLDIRECT 121.0 10/27/2020  TRIG 142.0 05/08/2023   CHOLHDL 4 05/08/2023    Medications Reviewed Today     Reviewed by Carla Milling, RPH-CPP (Pharmacist) on 08/18/24 at 1529  Med List Status: <None>   Medication Order Taking? Sig Documenting Provider Last Dose Status Informant  acetaminophen  (TYLENOL ) 500 MG tablet [id number]  Take 1,000 mg by mouth as needed for mild pain (pain score 1-3). [provider]  Active Self, Pharmacy Records  albuterol  (PROVENTIL ) (2.5 MG/3ML) 0.083% nebulizer solution [id number] Yes USE 1 VIAL IN NEBULIZER EVERY 4 HOURS AS  NEEDED FOR WHEEZING FOR SHORTNESS OF BREATH Wendling, [name], DO  Active Self, Pharmacy Records  albuterol  (VENTOLIN  HFA) [phone] Base) MCG/ACT inhaler [id number] Yes Inhale 2 puffs into the lungs every 4 (four) hours as needed for wheezing or shortness of breath. [name], DO  Active     Discontinued 08/18/24 1507 (Completed Course)   apixaban  (ELIQUIS ) 5 MG TABS tablet [id number] Yes Take 1 tablet (5 mg total) by mouth 2 (two) times daily. [name], DO  Active   augmented betamethasone  dipropionate (DIPROLENE -AF) 0.05 % cream [id number]  Apply twice daily as needed. [name], DO  Active   Continuous Glucose Sensor (DEXCOM G7 SENSOR) [place] [id number] Yes USE TO CHECK FOR BLOOD SUGAR CONTINUOUSLY- CHANGE SENSOR EVERY 10 DAYS [name], DO  Active   dapagliflozin  propanediol (FARXIGA ) 10 MG TABS tablet [id number] Yes Take 1 tablet (10 mg total) by mouth daily before breakfast. [name], DO  Active   diclofenac  (FLECTOR ) 1.3 % 1 patch [id number]   [name], DO  Active   diclofenac  (FLECTOR ) 1.3 % PTCH [id number]  PLACE 1 PATCH ONTO THE SKIN TWICE DAILY - WEAR PATCH UP TO 12 HOURS THEN REMOVE AND REPLACE WITH A NEW PATCH AS NEEDED [name], DO  Active   diclofenac  Sodium (PENNSAID ) 2 % SOLN [id number]  Apply 2 Pump (40 mg total) topically 2 (two) times daily as needed. 1 application to back twice daily [name], DO  Active   diltiazem  (CARDIZEM  CD) 360 MG 24 hr capsule [id number] Yes Take 1 capsule (360 mg total) by mouth daily. [name], DO  Active   EPINEPHrine  0.3 mg/0.3 mL IJ SOAJ injection [id number]  Inject 0.3 mg into the muscle as needed for anaphylaxis. [name], DO  Active   ferrous sulfate  (FEROSUL) 325 (65 FE) MG tablet [id number]  Take 1 tablet (325 mg total) by mouth daily with breakfast. [name], DO  Active   fluconazole  (DIFLUCAN )  150 MG tablet [id number]  Take 1 tab, repeat in 72 hours if no improvement. [name], DO  Active   fluticasone  (FLONASE ) 50 MCG/ACT nasal spray [id number]  Place 2 sprays into both nostrils daily as needed for allergies or rhinitis. [name], DO  Active   fluticasone -salmeterol (ADVAIR) 100-50 MCG/ACT AEPB [id number]  Inhale 1 puff into the lungs 2 (two) times daily.  Patient taking differently: Inhale 1 puff into the lungs daily.   [name], DO  Active   furosemide  (LASIX ) 40 MG tablet [id number] Yes Take 1 tablet (40 mg total) by mouth daily. [name], DO  Active   gabapentin  (NEURONTIN ) 400 MG capsule [id number] Yes Take 1 capsule (400 mg total) by mouth 4 (four) times daily. TAKE 1 CAPSULE BY MOUTH IN THE MORNING AND 1 AT NOON AND 2 AT BEDTIME Strength: 400 mg Wendling, Nicholas Paul, DO  Active  gentamicin ointment (GARAMYCIN) 0.1 % [id number]  Apply 1 Application topically in the morning and at bedtime. [provider]  Active Self, Pharmacy Records  Glucagon , rDNA, (GLUCAGON  EMERGENCY) 1 MG KIT [id number]  Inject 1 mg into the vein daily as needed. [name], DO  Active   Insulin  Glargine (BASAGLAR  KWIKPEN) 100 UNIT/ML [id number] Yes Inject 60 Units into the skin at bedtime. [name], DO  Active   insulin  lispro (HUMALOG  KWIKPEN) 200 UNIT/ML KwikPen [id number] Yes Inject 60 Units into the skin 3 (three) times daily with meals. [name], DO  Active   Insulin  Syringe-Needle U-100 (INSULIN  SYRINGE .5CC/30GX5/16) 30G X 5/16 0.5 ML MISC [id number] Yes Take as directed with Novolog  inuslin.  15-20 units per day [name], DO  Active   Insulin  Syringes, Disposable, U-100 0.5 ML MISC [id number]  100 each by Does not apply route 4 (four) times daily -  before meals and at bedtime. Caleen Burgess BROCKS, MD  Active   isosorbide  mononitrate (IMDUR ) 60 MG 24 hr tablet [id number]  Take 1 tablet by  mouth once daily  Patient not taking: Reported on 08/18/2024   Shlomo Wilbert SAUNDERS, MD  Active   levocetirizine (XYZAL ) 5 MG tablet [id number] Yes Take 1 tablet (5 mg total) by mouth every evening. [name], DO  Active   levothyroxine  (SYNTHROID ) 200 MCG tablet [id number] Yes Take 1 tablet (200 mcg total) by mouth daily before breakfast. [name], DO  Active   lidocaine  (LIDODERM ) 5 % [id number]  Place 1 patch onto the skin every 12 (twelve) hours. Remove & Discard patch within 12 hours or as directed Debby Fidela CROME, NP  Active Self, Pharmacy Records  lidocaine  (XYLOCAINE ) 5 % ointment [id number]  Apply 1 Application topically in the morning and at bedtime. [provider]  Active Self, Pharmacy Records  metFORMIN  (GLUCOPHAGE -XR) 500 MG 24 hr tablet [id number]  Take 2 tablets by mouth twice daily [name], DO  Active   MOUNJARO  5 MG/0.5ML Pen [id number] Yes INJECT 5MG  INTO THE SIKIN  ONCE A WEEK FOR 28 DAYS Wendling, [name], DO  Active   Multiple Vitamins-Minerals (WOMENS 50+ MULTI VITAMIN PO) [id number] Yes Take 1 tablet by mouth in the morning and at bedtime. [provider]  Active Self, Pharmacy Records  mupirocin  ointment (BACTROBAN ) 2 % [id number]  APPLY  OINTMENT TOPICALLY TWICE DAILY Frann, [name], DO  Active   naloxone  (NARCAN ) nasal spray 4 mg/0.1 mL [id number]  Place 1 spray into the nose as needed (opoid overdose). [name], DO  Active   nitroGLYCERIN  (NITROSTAT ) 0.4 MG SL tablet [id number]  Place 1 tablet (0.4 mg total) under the tongue every 5 (five) minutes as needed for chest pain. [name], DO  Active   nystatin  (MYCOSTATIN /NYSTOP ) powder [id number]  Apply 1 Application topically as needed. [provider]  Active   omeprazole  (PRILOSEC) 40 MG capsule [id number]  Take 1 capsule by mouth once daily [name], DO  Active   OneTouch Delica Lancets 33G MISC [id number]  Use  to check blood glucose up to 4 times a day [name], DO  Active Self, Pharmacy Records  [facility] VERIO test strip [id number]  Use as instructed [name], DO  Active Self, Pharmacy Records  Oxycodone  HCl 20 MG TABS [id number]  Take 1 tablet (20 mg total) by mouth in the morning, at noon, and at bedtime. [name], DO  Active  Polyethyl Glyc-Propyl Glyc PF (SYSTANE PRESERVATIVE FREE) 0.4-0.3 % SOLN [id number]  Place 2 drops into both eyes in the morning and at bedtime. [provider]  Active Self, Pharmacy Records  pramipexole  (MIRAPEX ) 1 MG tablet [id number] Yes Take 1 tablet (1 mg total) by mouth 3 (three) times daily. [name], DO  Active   promethazine  (PHENERGAN ) 25 MG tablet [id number]  Take 1 tablet (25 mg total) by mouth as needed for vomiting or nausea. [name], DO  Active   spironolactone  (ALDACTONE ) 50 MG tablet [id number] Yes Take 1 tablet (50 mg total) by mouth daily. [name], DO  Active   Vitamin D , Ergocalciferol , (DRISDOL ) 1.25 MG (50000 UNIT) CAPS capsule [id number] Yes Take 1 capsule by mouth once a week [name], DO  Active               Assessment/Plan:   Diabetes: - Currently uncontrolled; GMI shows improved blood glucose control but not at goal A1c <7%. Cardiorenal risk reduction is opportunities for improvement.. Blood pressure is at goal <130/80. LDL is not at goal.  - Reviewed goal A1c, goal fasting, and goal 2 hour post prandial glucose. Recommended to check glucose continuously with Continuous Glucose Monitor  - Recommend to continue Farxiga , Basaglar , Humalog  U200 and metformin . - Increase Mounjaro  to 7.5mg  weekly  - Discussed side effects of gastrointestinal upset/nausea; eating smaller meals, avoiding high-fat foods, and remaining upright after eating may reduce nausea. Discussed that overeating is a major trigger of nausea with this class of medications,  as often times patients will start to feel full sooner and may need to decrease portion sizes from what they were previously accustomed to. , Advised on sick day rules (if a day with significantly reduced oral intake, serious vomiting, or diarrhea, hold SGLT2), Encouraged adequate hydration and genital hygiene.  - Reviewed how to treat a low blood glucose reading.   Medication Adherence:  - Will coordinate with PCP for refills on medications - patient requested the following - Mounjaro , Humalog , metformin , omeprazole , diltiazem , pramipexole , generic Advair and vitamin D . Will be up to PCP about refilling isosorbide .  - Patient to continue to work with Tobias Moose to see if she would qualify for Medicaid coverage.   Follow Up Plan: 2 weeks to adjust insulin  if needed with new Mounjaro  dose and to check to see if she was able to get insurance coverage for 2026. If not we will start applying for patient assistance program.   Madelin Ray, PharmD Clinical Pharmacist [facility]  Population Health [phone]

## 2024-08-19 ENCOUNTER — Telehealth: Payer: Self-pay | Admitting: Pharmacy Technician

## 2024-08-19 ENCOUNTER — Other Ambulatory Visit (HOSPITAL_COMMUNITY): Payer: Self-pay

## 2024-08-19 NOTE — Telephone Encounter (Signed)
 Pharmacy Patient Advocate Encounter   Received notification from CoverMyMeds that prior authorization for Mounjaro  7.5MG /0.5ML auto-injectors  is required/requested.   Insurance verification completed.   The patient is insured through [employer].   Per test claim: PA required; PA submitted to above mentioned insurance via Latent Key/confirmation #/EOC [id number] Status is pending

## 2024-08-20 ENCOUNTER — Other Ambulatory Visit (HOSPITAL_COMMUNITY): Payer: Self-pay

## 2024-08-20 ENCOUNTER — Other Ambulatory Visit: Payer: Self-pay | Admitting: Family Medicine

## 2024-08-20 NOTE — Telephone Encounter (Signed)
 Copied from CRM #[id number]. Topic: Clinical - Medication Refill >> Aug 20, 2024  4:05 PM Delon T wrote: Medication: diclofenac  (FLECTOR ) 1.3 % PTCH-  90 day supply  Has the patient contacted their pharmacy? No (Agent: If no, request that the patient contact the pharmacy for the refill. If patient does not wish to contact the pharmacy document the reason why and proceed with request.) (Agent: If yes, when and what did the pharmacy advise?)  This is the patient's preferred pharmacy:  [facility] [address], [place] - [address] [place] [address] [address] Phone: [phone] Fax: [phone]  Is this the correct pharmacy for this prescription? Yes If no, delete pharmacy and type the correct one.   Has the prescription been filled recently? Yes  Is the patient out of the medication? No  Has the patient been seen for an appointment in the last year OR does the patient have an upcoming appointment? Yes  Can we respond through MyChart? Yes  Agent: Please be advised that Rx refills may take up to 3 business days. We ask that you follow-up with your pharmacy.

## 2024-08-20 NOTE — Telephone Encounter (Signed)
 Pharmacy Patient Advocate Encounter  Received notification from [employer] that Prior Authorization for Mounjaro  7.5MG /0.5ML auto-injectors  has been APPROVED from 08/19/24 to 08/20/25. Unable to obtain price due to refill too soon rejection, last fill date 08/20/24 next available fill date 10/28/24   PA #/Case ID/Reference #: [id number]

## 2024-08-21 ENCOUNTER — Other Ambulatory Visit: Payer: Self-pay | Admitting: Family Medicine

## 2024-08-21 DIAGNOSIS — E559 Vitamin D deficiency, unspecified: Secondary | ICD-10-CM

## 2024-08-21 MED ORDER — DICLOFENAC EPOLAMINE 1.3 % EX PTCH: 1.0000 | MEDICATED_PATCH | Freq: Two times a day (BID) | CUTANEOUS | 1 refills | Status: DC

## 2024-08-25 ENCOUNTER — Other Ambulatory Visit: Payer: Self-pay | Admitting: Licensed Clinical Social Worker

## 2024-08-26 ENCOUNTER — Other Ambulatory Visit: Payer: Self-pay

## 2024-08-26 ENCOUNTER — Encounter: Payer: Self-pay | Admitting: Licensed Clinical Social Worker

## 2024-08-26 NOTE — Patient Instructions (Signed)
 Visit Information  Thank you for taking time to visit with me today. Please don't hesitate to contact me if I can be of assistance to you before our next scheduled appointment.  Your next care management appointment is by telephone on 09/23/24 at 1 PM  Please call the care guide team at [phone] if you need to cancel, schedule, or reschedule an appointment.   Please call the Suicide and Crisis Lifeline: 988 call 1-800-273-TALK (toll free, 24 hour hotline) if you are experiencing a Mental Health or Behavioral Health Crisis or need someone to talk to.  Rosaline Finlay, RN MSN [place]  [facility] Population Health RN Care Manager Direct Dial: [phone]  Fax: [phone]

## 2024-08-26 NOTE — Patient Instructions (Signed)
 Visit Information  Thank you for taking time to visit with me today. Please don't hesitate to contact me if I can be of assistance to you before our next scheduled appointment.  Your next care management appointment is by telephone on 09/10/2024 at 10:00 am   Please call the care guide team at [phone] if you need to cancel, schedule, or reschedule an appointment.   Please call the Suicide and Crisis Lifeline: 988 go to [facility]gent [facility] [address] [place] [phone]) call 911 if you are experiencing a Mental Health or Behavioral Health Crisis or need someone to talk to.  Tobias CHARM Maranda HEDWIG, PhD [facility], [facility] Social Worker Direct Dial: [phone]  Fax: [phone]

## 2024-08-26 NOTE — Patient Outreach (Signed)
 08/26/2024 Sw received an email back from the walk in clinic and it state that the patient and her husband are over income for full Medicaid, and that they have the family planing coverage only. SW call the patient and the patient was unsure about what that was. SW contacted the Medicaid walk in clinic via email and asked if they could explain or either call the patient to provide guidance and explanation.    [name] [name] HEDWIG, PhD [facility], [facility] Social Worker Direct Dial: [phone]  Fax: [phone]

## 2024-08-26 NOTE — Patient Outreach (Signed)
 Complex Care Management   Visit Note  08/26/2024  Name:  [name] MRN: [id number] DOB: April 04, 1961  Situation: Referral received for Complex Care Management related to Diabetes with Complications and limited mobility I obtained verbal consent from Patient.  Visit completed with Patient  on the phone  Background:   Past Medical History:  Diagnosis Date   Aortic stenosis    mild AS by echo 02/2021   Asthma    on daily RX and rescue inhaler (04/18/2018)   Chronic diastolic CHF (congestive heart failure) (HCC) 09/2015   Chronic lower back pain    Chronic neck pain    Chronic pain syndrome    Fentanyl  Patch   Colon polyps    Coronary artery disease    cath with normal LM, 30% LAD, 85% mid RCA and 95% distal RCA s/p PCI of the mid to distal RCA and now on DAPT with ASA and Ticagrelor .     Eczema    Excessive daytime sleepiness 11/26/2015   Fibromyalgia    Gallstones    GERD (gastroesophageal reflux disease)    Heart murmur    noted for the 1st time on 04/18/2018   History of blood transfusion 07/2010   S/P oophorectomy   History of gout    History of hiatal hernia 1980s   gone now (04/18/2018)   Hyperlipidemia    Hypertension    takes Metoprolol  and Enalapril  daily   Hypothyroidism    takes Synthroid  daily   IBS (irritable bowel syndrome)    Migraine    nothing in the 2000s (04/18/2018)   Mixed connective tissue disease    NAFLD (nonalcoholic fatty liver disease)    Pneumonia    several times (04/18/2018)   PVC's (premature ventricular contractions)    noted on event monitor 02/2021   Rheumatoid arthritis (HCC)    hands, elbows, shoulders, probably knees (04/18/2018)   Scoliosis    Sjogren's syndrome    Sleep apnea    mild - does not use cpap   Spondylosis    Type II diabetes mellitus (HCC)    takes Metformin  and Hum R daily (04/18/2018)   Walker as ambulation aid    also uses wheelchair    Assessment: Patient Reported Symptoms:  Cognitive Cognitive  Status: Able to follow simple commands, Alert and oriented to person, place, and time, Normal speech and language skills Cognitive/Intellectual Conditions Management [RPT]: None reported or documented in medical history or problem list   Health Maintenance Behaviors: Annual physical exam, Exercise Health Facilitated by: Pain control, Rest  Neurological Neurological Review of Symptoms: Not assessed    HEENT HEENT Symptoms Reported: Not assessed      Cardiovascular Cardiovascular Symptoms Reported: Swelling in legs or feet Does patient have uncontrolled Hypertension?: No Cardiovascular Management Strategies: Exercise, Medication therapy, Routine screening Cardiovascular Comment: Patient reports a little bit of swelling in her legs. She reports she took an extra dose of lasix  today. Patient reports typically one extra dose of lasix  relieves swelling, and her husband will keep an eye on it  Respiratory Respiratory Symptoms Reported: Shortness of breath, Wheezing Additional Respiratory Details: Patient reports a little bit shortness of breath and wheezing related to asthma. She reports she continues to forget nighttime dose of Advair, but has remembered the past couple of days Respiratory Management Strategies: Adequate rest, Breathing exercise, Medication therapy, Routine screening  Endocrine Endocrine Symptoms Reported: No symptoms reported Is patient diabetic?: Yes Is patient checking blood sugars at home?: Yes List most  recent blood sugar readings, include date and time of day: Dexcom G7, 135 this morning Endocrine Comment: Patient confirms she is working with pharmacist to ensure medication refills by the end of the year  Gastrointestinal Gastrointestinal Symptoms Reported: Nausea, Constipation Additional Gastrointestinal Details: Patient reports nausea today so she drank a ginger ale. Patient reports nausea is typically relieved with about 4 oz ginger ale, or phenergan  when it is really  bad. Patient reports nausea occurs every day. Patient reports she is now dealing with constipation and is working on dietary modification to promote BMs. Last BM yesterday. Gastrointestinal Management Strategies: Coping strategies, Medication therapy, Diet modification    Genitourinary Genitourinary Symptoms Reported: No symptoms reported Additional Genitourinary Details: Patient denies current UTI symptoms Genitourinary Management Strategies: Catheter, indwelling  Integumentary Integumentary Symptoms Reported: No symptoms reported Additional Integumentary Details: Patient reports the sores on the bottom of her foot are almost gone, they're almost closed. 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 She reports she has recently started to do chair yoga. Patient state the motivation is there. 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)  Psychosocial Psychosocial Symptoms Reported: Not assessed          08/26/2024    PHQ2-9 Depression Screening   Little interest or pleasure in  doing things    Feeling down, depressed, or hopeless    PHQ-2 - Total Score    Trouble falling or staying asleep, or sleeping too much    Feeling tired or having little energy    Poor appetite or overeating     Feeling bad about yourself - or that you are a failure or have let yourself or your family down    Trouble concentrating on things, such as reading the newspaper or watching television    Moving or speaking so slowly that other people could have noticed.  Or the opposite - being so fidgety or restless that you have been moving around a lot more than usual    Thoughts that you would be better off dead, or hurting yourself in some way    PHQ2-9 Total Score    If you checked off any problems, how difficult have these problems made it for you to do your work, take care of things at home, or get along with other people    Depression Interventions/Treatment      There were no vitals filed for this visit. Pain Scale: 0-10 Pain Score: 10-Worst pain ever Pain Type: Chronic pain Pain Location: Back Pain Orientation: Lower  Medications Reviewed Today     Reviewed by Arno Rosaline SQUIBB, RN (Registered Nurse) on 08/26/24 at 1305  Med List Status: <None>   Medication Order Taking? Sig Documenting Provider Last Dose Status Informant  acetaminophen  (TYLENOL )  500 MG tablet [id number]  Take 1,000 mg by mouth as needed for mild pain (pain score 1-3). [provider]  Active Self, Pharmacy Records  albuterol  (PROVENTIL ) (2.5 MG/3ML) 0.083% nebulizer solution [id number]  USE 1 VIAL IN NEBULIZER EVERY 4 HOURS AS NEEDED FOR WHEEZING FOR SHORTNESS OF BREATH Wendling, [name], DO  Active Self, Pharmacy Records  albuterol  (VENTOLIN  HFA) [phone] Base) MCG/ACT inhaler [id number]  Inhale 2 puffs into the lungs every 4 (four) hours as needed for wheezing or shortness of breath. [name], DO  Active   apixaban  (ELIQUIS ) 5 MG TABS tablet [id number]  Take 1 tablet (5 mg total) by  mouth 2 (two) times daily. [name], DO  Active   augmented betamethasone  dipropionate (DIPROLENE -AF) 0.05 % cream [id number]  Apply twice daily as needed. [name], DO  Active   Continuous Glucose Sensor (DEXCOM G7 SENSOR) [place] [id number]  USE TO CHECK FOR BLOOD SUGAR CONTINUOUSLY- CHANGE SENSOR EVERY 10 DAYS [name], DO  Active   dapagliflozin  propanediol (FARXIGA ) 10 MG TABS tablet [id number]  Take 1 tablet (10 mg total) by mouth daily before breakfast. [name], DO  Active   diclofenac  (FLECTOR ) 1.3 % 1 patch [id number]   [name], DO  Active   diclofenac  (FLECTOR ) 1.3 % [facility] [id number]  Place 1 patch onto the skin 2 (two) times daily. [name], DO  Active   diclofenac  Sodium (PENNSAID ) 2 % SOLN [id number]  Apply 2 Pump (40 mg total) topically 2 (two) times daily as needed. 1 application to back twice daily [name], DO  Active   diltiazem  (CARDIZEM  CD) 360 MG 24 hr capsule [id number]  Take 1 capsule (360 mg total) by mouth daily. [name], DO  Active   EPINEPHrine  0.3 mg/0.3 mL IJ SOAJ injection [id number]  Inject 0.3 mg into the muscle as needed for anaphylaxis. [name], DO  Active   ferrous sulfate  (FEROSUL) 325 (65 FE) MG tablet [id number]  Take 1 tablet (325 mg total) by mouth daily with breakfast. [name], DO  Active   fluconazole  (DIFLUCAN ) 150 MG tablet [id number]  TAKE 1 TABLET BY MOUTH NOW, REPEAT  IN  72  HOURS  IF  NO  IMPROVEMENT [name], DO  Active   fluticasone  (FLONASE ) 50 MCG/ACT nasal spray [id number]  Place 2 sprays into both nostrils daily as needed for allergies or rhinitis. [name], DO  Active   fluticasone -salmeterol (ADVAIR) 100-50 MCG/ACT AEPB [id number]  Inhale 1 puff into the lungs 2 (two) times daily. [name], DO  Active   furosemide  (LASIX ) 40 MG tablet [id number]  Take 1 tablet (40 mg  total) by mouth daily. [name], DO  Active   gabapentin  (NEURONTIN ) 400 MG capsule [id number]  Take 1 capsule (400 mg total) by mouth 4 (four) times daily. TAKE 1 CAPSULE BY MOUTH IN THE MORNING AND 1 AT NOON AND 2 AT BEDTIME Strength: 400 mg Wendling, Nicholas Paul, DO  Active   gentamicin ointment (GARAMYCIN) 0.1 % [id number]  Apply 1 Application topically in the morning and at bedtime. [provider]  Active Self, Pharmacy Records  Glucagon , rDNA, (GLUCAGON  EMERGENCY) 1 MG KIT [id number]  Inject 1 mg into the vein daily as needed. [name], DO  Active   Insulin  Glargine (BASAGLAR  KWIKPEN) 100 UNIT/ML [id number]  Inject 60 Units into the skin at bedtime. [name], DO  Active  insulin  lispro (HUMALOG  KWIKPEN) 200 UNIT/ML KwikPen [id number]  Inject 60 Units into the skin 3 (three) times daily with meals. [name], DO  Active   Insulin  Syringe-Needle U-100 (INSULIN  SYRINGE .5CC/30GX5/16) 30G X 5/16 0.5 ML MISC [id number]  Take as directed with Novolog  inuslin.  15-20 units per day [name], DO  Active   Insulin  Syringes, Disposable, U-100 0.5 ML MISC [id number]  100 each by Does not apply route 4 (four) times daily -  before meals and at bedtime. Caleen Burgess BROCKS, MD  Active   isosorbide  mononitrate (IMDUR ) 60 MG 24 hr tablet [id number]  Take 1 tablet (60 mg total) by mouth daily. [name], DO  Active   levocetirizine (XYZAL ) 5 MG tablet [id number]  Take 1 tablet (5 mg total) by mouth every evening. [name], DO  Active   levothyroxine  (SYNTHROID ) 200 MCG tablet [id number]  Take 1 tablet (200 mcg total) by mouth daily before breakfast. [name], DO  Active   lidocaine  (LIDODERM ) 5 % [id number]  Place 1 patch onto the skin every 12 (twelve) hours. Remove & Discard patch within 12 hours or as directed Debby Fidela CROME, NP  Active Self, Pharmacy Records  lidocaine  (XYLOCAINE ) 5 % ointment  [id number]  Apply 1 Application topically in the morning and at bedtime. [provider]  Active Self, Pharmacy Records  metFORMIN  (GLUCOPHAGE -XR) 500 MG 24 hr tablet [id number]  Take 2 tablets (1,000 mg total) by mouth 2 (two) times daily. [name], DO  Active   Multiple Vitamins-Minerals (WOMENS 50+ MULTI VITAMIN PO) [id number]  Take 1 tablet by mouth in the morning and at bedtime. [provider]  Active Self, Pharmacy Records  mupirocin  ointment (BACTROBAN ) 2 % [id number]  APPLY  OINTMENT TOPICALLY TWICE DAILY [name], DO  Active   naloxone  (NARCAN ) nasal spray 4 mg/0.1 mL [id number]  Place 1 spray into the nose as needed (opoid overdose). [name], DO  Active   nitroGLYCERIN  (NITROSTAT ) 0.4 MG SL tablet [id number]  Place 1 tablet (0.4 mg total) under the tongue every 5 (five) minutes as needed for chest pain. [name], DO  Active   nystatin  (MYCOSTATIN /NYSTOP ) powder [id number]  Apply 1 Application topically as needed. [provider]  Active   omeprazole  (PRILOSEC) 40 MG capsule [id number]  Take 1 capsule (40 mg total) by mouth daily. [name], DO  Active   OneTouch Delica Lancets 33G [place] [id number]  Use to check blood glucose up to 4 times a day [name], DO  Active Self, Pharmacy Records  [facility] VERIO test strip [id number]  Use as instructed [name], DO  Active Self, Pharmacy Records  Oxycodone  HCl 20 MG TABS [id number]  Take 1 tablet (20 mg total) by mouth in the morning, at noon, and at bedtime. [name], DO  Active   Polyethyl Glyc-Propyl Glyc PF (SYSTANE PRESERVATIVE FREE) 0.4-0.3 % SOLN [id number]  Place 2 drops into both eyes in the morning and at bedtime. [provider]  Active Self, Pharmacy Records  pramipexole  (MIRAPEX ) 1 MG tablet [id number]  Take 1 tablet (1 mg total) by mouth 3 (three) times daily. [name], DO  Active    promethazine  (PHENERGAN ) 25 MG tablet [id number]  Take 1 tablet (25 mg total) by mouth as needed for vomiting or nausea. [name], DO  Active   spironolactone  (ALDACTONE ) 50 MG tablet [id number]  Take 1 tablet (50 mg  total) by mouth daily. [name], DO  Active   tirzepatide  (MOUNJARO ) 7.5 MG/0.5ML Pen [id number]  Inject 7.5 mg into the skin once a week. [name], DO  Active   Vitamin D , Ergocalciferol , (DRISDOL ) 1.25 MG (50000 UNIT) CAPS capsule [id number]  Take 1 capsule by mouth once a week [name], DO  Active             Recommendation:   Continue Current Plan of Care  Follow Up Plan:   Telephone follow up appointment date/time:  09/23/24 at 1 PM  Rosaline Finlay, RN MSN [place]  VBCI Population Health RN Care Manager Direct Dial: [phone]  Fax: [phone]

## 2024-08-26 NOTE — Patient Outreach (Signed)
 Complex Care Management   Visit Note  08/26/2024  Name:  [name] MRN: [id number] DOB: [birth year]  Situation: Referral received for Complex Care Management related to Medicaid Enrollment I obtained verbal consent from Patient.  Visit completed with Patient  on the phone  Background:   Past Medical History:  Diagnosis Date   Aortic stenosis    mild AS by echo 02/2021   Asthma    on daily RX and rescue inhaler (04/18/2018)   Chronic diastolic CHF (congestive heart failure) (HCC) 09/2015   Chronic lower back pain    Chronic neck pain    Chronic pain syndrome    Fentanyl  Patch   Colon polyps    Coronary artery disease    cath with normal LM, 30% LAD, 85% mid RCA and 95% distal RCA s/p PCI of the mid to distal RCA and now on DAPT with ASA and Ticagrelor .     Eczema    Excessive daytime sleepiness 11/26/2015   Fibromyalgia    Gallstones    GERD (gastroesophageal reflux disease)    Heart murmur    noted for the 1st time on 04/18/2018   History of blood transfusion 07/2010   S/P oophorectomy   History of gout    History of hiatal hernia 1980s   gone now (04/18/2018)   Hyperlipidemia    Hypertension    takes Metoprolol  and Enalapril  daily   Hypothyroidism    takes Synthroid  daily   IBS (irritable bowel syndrome)    Migraine    nothing in the 2000s (04/18/2018)   Mixed connective tissue disease    NAFLD (nonalcoholic fatty liver disease)    Pneumonia    several times (04/18/2018)   PVC's (premature ventricular contractions)    noted on event monitor 02/2021   Rheumatoid arthritis (HCC)    hands, elbows, shoulders, probably knees (04/18/2018)   Scoliosis    Sjogren's syndrome    Sleep apnea    mild - does not use cpap   Spondylosis    Type II diabetes mellitus (HCC)    takes Metformin  and Hum R daily (04/18/2018)   Walker as ambulation aid    also uses wheelchair    Assessment: Patient Reported Symptoms:  Cognitive        Neurological      HEENT         Cardiovascular      Respiratory      Endocrine      Gastrointestinal        Genitourinary      Integumentary      Musculoskeletal          Psychosocial            08/26/2024    PHQ2-9 Depression Screening   Little interest or pleasure in doing things    Feeling down, depressed, or hopeless    PHQ-2 - Total Score    Trouble falling or staying asleep, or sleeping too much    Feeling tired or having little energy    Poor appetite or overeating     Feeling bad about yourself - or that you are a failure or have let yourself or your family down    Trouble concentrating on things, such as reading the newspaper or watching television    Moving or speaking so slowly that other people could have noticed.  Or the opposite - being so fidgety or restless that you have been moving around a lot more than usual  Thoughts that you would be better off dead, or hurting yourself in some way    PHQ2-9 Total Score    If you checked off any problems, how difficult have these problems made it for you to do your work, take care of things at home, or get along with other people    Depression Interventions/Treatment      There were no vitals filed for this visit.    Medications Reviewed Today   Medications were not reviewed in this encounter     Recommendation: Follow up with Medicaid walk in clinic, SW will also sent a message to the walk in clinic.   Follow Up Plan:   Telephone follow up appointment date/time:  09/10/2024 at 10:00 am  [name] CHARM Maranda HEDWIG, PhD [facility], [facility] Social Worker Direct Dial: [phone]  Fax: [phone]

## 2024-09-07 ENCOUNTER — Other Ambulatory Visit: Payer: Self-pay | Admitting: Family Medicine

## 2024-09-08 ENCOUNTER — Telehealth: Payer: Self-pay | Admitting: Family Medicine

## 2024-09-08 MED ORDER — FREESTYLE LIBRE 3 PLUS SENSOR MISC: 3 refills | Status: AC

## 2024-09-08 NOTE — Telephone Encounter (Signed)
 Currently has Dexcom on her list, please advise if okay to write freestyle?   Copied from CRM #[id number]. Topic: Clinical - Medication Prior Auth >> Sep 08, 2024  2:39 PM Drema MATSU wrote: Reason for CRM: Chelsea with [facility] stated that Pt wants to switch to [facility] 3 sensor plus and it needs Authorization.

## 2024-09-08 NOTE — Telephone Encounter (Signed)
 Copied from CRM [id number]. Topic: Clinical - Medication Question >> Sep 08, 2024 12:20 PM [name] C wrote: Reason for CRM: Pt requesting to speak with Madelin Purple regarding a conversation they have had previously. Please have Tammy return call at [phone]

## 2024-09-08 NOTE — Telephone Encounter (Signed)
 Tried to call patient but was not able to reach her. MB has not been set up so was unable to LM.

## 2024-09-08 NOTE — Telephone Encounter (Signed)
 Tried to call number provided but it is just a general number for [employer] - no direct way to contact any specific person.  Also tried to contact patient again but no answer and her VM is not set up.  Sent Rx to [facility] - I am assuming that is where patient wanted it sent.  Prior authorization was sent thru Cover My Meds to [employer].  Jozlin Mandich (Key: [id number])

## 2024-09-09 ENCOUNTER — Other Ambulatory Visit: Payer: Self-pay | Admitting: Family Medicine

## 2024-09-10 ENCOUNTER — Other Ambulatory Visit: Payer: Self-pay | Admitting: Licensed Clinical Social Worker

## 2024-09-10 NOTE — Patient Instructions (Signed)
 Visit Information  Thank you for taking time to visit with me today. Please don't hesitate to contact me if I can be of assistance to you before our next scheduled appointment.  Your next care management appointment is no further scheduled appointments.      Please call the care guide team at [phone] if you need to cancel, schedule, or reschedule an appointment.   Please call the Suicide and Crisis Lifeline: 988 go to [facility] Urgent [facility] [address] [place] [phone]) call 911 if you are experiencing a Mental Health or Behavioral Health Crisis or need someone to talk to.  Jeanie Cooks, PhD [facility], [facility] Social Worker Direct Dial: [phone]  Fax: [phone]

## 2024-09-10 NOTE — Patient Outreach (Signed)
 Social Drivers of Health  Community Resource and Care Coordination Visit Note   09/10/2024  Name: [name] MRN: [id number] DOB:December 05, 1960  Situation: Referral received for [facility] needs assessment and assistance related to [facility] eligibility. I obtained verbal consent from Patient.  Visit completed with Patient on the phone.   Background:   SDOH Interventions Today    Flowsheet Row Most Recent Value  SDOH Interventions   Food Insecurity Interventions Intervention Not Indicated  Housing Interventions Intervention Not Indicated  Transportation Interventions Intervention Not Indicated  Utilities Interventions Intervention Not Indicated     Assessment:   Goals Addressed             This Visit's Progress    COMPLETED: BSW VBCI Social Work Care Plan       Current SDOH Barriers:  Family needing and wanting Full Medicaid  Interventions: Patient interviewed and appropriate screenings performed Provided patient with information about walk in [facility]          Recommendation:   attend all scheduled provider appointments Continue to work with the Medicaid worker on eligibility for full Medicaid, as of today they are still over in income  Follow Up Plan:   Patient has achieved all patient stated goals. [employer] will be closed. Patient has been provided contact information should new needs arise.   [name] CHARM Maranda HEDWIG, PhD [facility], [facility] Social Worker Direct Dial: [phone]  Fax: [phone]

## 2024-09-23 ENCOUNTER — Other Ambulatory Visit: Payer: Self-pay

## 2024-09-23 ENCOUNTER — Observation Stay (HOSPITAL_COMMUNITY)
Admission: EM | Admit: 2024-09-23 | Discharge: 2024-09-25 | Disposition: A | Discharge status: Home / Self Care | Attending: Internal Medicine | Admitting: Internal Medicine

## 2024-09-23 ENCOUNTER — Telehealth: Payer: Self-pay

## 2024-09-23 ENCOUNTER — Emergency Department (HOSPITAL_COMMUNITY)

## 2024-09-23 DIAGNOSIS — I2510 Atherosclerotic heart disease of native coronary artery without angina pectoris: Secondary | ICD-10-CM | POA: Diagnosis not present

## 2024-09-23 DIAGNOSIS — G4730 Sleep apnea, unspecified: Secondary | ICD-10-CM | POA: Diagnosis not present

## 2024-09-23 DIAGNOSIS — Z794 Long term (current) use of insulin: Secondary | ICD-10-CM | POA: Diagnosis not present

## 2024-09-23 DIAGNOSIS — E1165 Type 2 diabetes mellitus with hyperglycemia: Secondary | ICD-10-CM | POA: Diagnosis not present

## 2024-09-23 DIAGNOSIS — I5032 Chronic diastolic (congestive) heart failure: Secondary | ICD-10-CM | POA: Diagnosis not present

## 2024-09-23 DIAGNOSIS — E785 Hyperlipidemia, unspecified: Secondary | ICD-10-CM | POA: Insufficient documentation

## 2024-09-23 DIAGNOSIS — K219 Gastro-esophageal reflux disease without esophagitis: Secondary | ICD-10-CM | POA: Insufficient documentation

## 2024-09-23 DIAGNOSIS — M3500 Sicca syndrome, unspecified: Secondary | ICD-10-CM | POA: Diagnosis not present

## 2024-09-23 DIAGNOSIS — I480 Paroxysmal atrial fibrillation: Secondary | ICD-10-CM | POA: Diagnosis not present

## 2024-09-23 DIAGNOSIS — N390 Urinary tract infection, site not specified: Secondary | ICD-10-CM | POA: Diagnosis not present

## 2024-09-23 DIAGNOSIS — I110 Hypertensive heart disease with heart failure: Secondary | ICD-10-CM | POA: Insufficient documentation

## 2024-09-23 DIAGNOSIS — E66813 Obesity, class 3: Secondary | ICD-10-CM | POA: Insufficient documentation

## 2024-09-23 DIAGNOSIS — J45909 Unspecified asthma, uncomplicated: Secondary | ICD-10-CM | POA: Insufficient documentation

## 2024-09-23 DIAGNOSIS — A419 Sepsis, unspecified organism: Secondary | ICD-10-CM | POA: Diagnosis not present

## 2024-09-23 DIAGNOSIS — I10 Essential (primary) hypertension: Secondary | ICD-10-CM | POA: Diagnosis present

## 2024-09-23 DIAGNOSIS — M059 Rheumatoid arthritis with rheumatoid factor, unspecified: Secondary | ICD-10-CM | POA: Insufficient documentation

## 2024-09-23 DIAGNOSIS — R112 Nausea with vomiting, unspecified: Secondary | ICD-10-CM | POA: Diagnosis present

## 2024-09-23 DIAGNOSIS — E039 Hypothyroidism, unspecified: Secondary | ICD-10-CM | POA: Diagnosis not present

## 2024-09-23 DIAGNOSIS — G4733 Obstructive sleep apnea (adult) (pediatric): Secondary | ICD-10-CM | POA: Diagnosis present

## 2024-09-23 LAB — COMPREHENSIVE METABOLIC PANEL WITH GFR
ALT: 36 U/L (ref 0–44)
AST: 37 U/L (ref 15–41)
Albumin: 3.8 g/dL (ref 3.5–5.0)
Alkaline Phosphatase: 95 U/L (ref 38–126)
Anion gap: 14 (ref 5–15)
BUN: 11 mg/dL (ref 8–23)
CO2: 25 mmol/L (ref 22–32)
Calcium: 9.7 mg/dL (ref 8.9–10.3)
Chloride: 94 mmol/L — ABNORMAL LOW (ref 98–111)
Creatinine, Ser: 0.93 mg/dL (ref 0.44–1.00)
GFR, Estimated: >60 mL/min
Glucose, Bld: 262 mg/dL — ABNORMAL HIGH (ref 70–99)
Potassium: 4.5 mmol/L (ref 3.5–5.1)
Sodium: 133 mmol/L — ABNORMAL LOW (ref 135–145)
Total Bilirubin: 0.7 mg/dL (ref 0.0–1.2)
Total Protein: 7.1 g/dL (ref 6.5–8.1)

## 2024-09-23 LAB — CBC WITH DIFFERENTIAL/PLATELET
Abs Immature Granulocytes: 0.26 K/uL — ABNORMAL HIGH (ref 0.00–0.07)
Basophils Absolute: 0.1 K/uL (ref 0.0–0.1)
Basophils Relative: 0 %
Eosinophils Absolute: 0 K/uL (ref 0.0–0.5)
Eosinophils Relative: 0 %
HCT: 47 % — ABNORMAL HIGH (ref 36.0–46.0)
Hemoglobin: 15.1 g/dL — ABNORMAL HIGH (ref 12.0–15.0)
Immature Granulocytes: 1 %
Lymphocytes Relative: 2 %
Lymphs Abs: 0.5 K/uL — ABNORMAL LOW (ref 0.7–4.0)
MCH: 28.3 pg (ref 26.0–34.0)
MCHC: 32.1 g/dL (ref 30.0–36.0)
MCV: 88.2 fL (ref 80.0–100.0)
Monocytes Absolute: 0.7 K/uL (ref 0.1–1.0)
Monocytes Relative: 3 %
Neutro Abs: 22.3 K/uL — ABNORMAL HIGH (ref 1.7–7.7)
Neutrophils Relative %: 94 %
Platelets: 223 K/uL (ref 150–400)
RBC: 5.33 MIL/uL — ABNORMAL HIGH (ref 3.87–5.11)
RDW: 15.3 % (ref 11.5–15.5)
WBC: 23.9 K/uL — ABNORMAL HIGH (ref 4.0–10.5)
nRBC: 0 % (ref 0.0–0.2)

## 2024-09-23 LAB — RESP PANEL BY RT-PCR (RSV, FLU A&B, COVID)  RVPGX2
Influenza A by PCR: NEGATIVE
Influenza B by PCR: NEGATIVE
Resp Syncytial Virus by PCR: NEGATIVE
SARS Coronavirus 2 by RT PCR: NEGATIVE

## 2024-09-23 LAB — URINALYSIS, W/ REFLEX TO CULTURE (INFECTION SUSPECTED)
Bilirubin Urine: NEGATIVE
Glucose, UA: >=500 mg/dL — AB
Ketones, ur: 20 mg/dL — AB
Nitrite: POSITIVE — AB
Protein, ur: NEGATIVE mg/dL
Specific Gravity, Urine: 1.02 (ref 1.005–1.030)
pH: 5 (ref 5.0–8.0)

## 2024-09-23 LAB — LACTIC ACID, PLASMA
Lactic Acid, Venous: 2.4 mmol/L (ref 0.5–1.9)
Lactic Acid, Venous: 2.8 mmol/L (ref 0.5–1.9)

## 2024-09-23 LAB — PROTIME-INR
INR: 1.2 (ref 0.8–1.2)
Prothrombin Time: 16.2 s — ABNORMAL HIGH (ref 11.4–15.2)

## 2024-09-23 MED ORDER — IOHEXOL 300 MG/ML  SOLN
100.0000 mL | Freq: Once | INTRAMUSCULAR | Status: AC | PRN
  Administered 2024-09-23: 100 mL via INTRAVENOUS

## 2024-09-23 MED ORDER — LACTATED RINGERS IV SOLN: INTRAVENOUS | Status: AC

## 2024-09-23 MED ORDER — INSULIN LISPRO 200 UNIT/ML ~~LOC~~ SOPN: 60.0000 [IU] | PEN_INJECTOR | Freq: Three times a day (TID) | SUBCUTANEOUS | Status: DC

## 2024-09-23 MED ORDER — INSULIN ASPART 100 UNIT/ML IJ SOLN: 60.0000 [IU] | Freq: Three times a day (TID) | INTRAMUSCULAR | Status: DC

## 2024-09-23 MED ORDER — LACTATED RINGERS IV BOLUS (SEPSIS)
1000.0000 mL | Freq: Once | INTRAVENOUS | Status: AC
  Administered 2024-09-23: 1000 mL via INTRAVENOUS

## 2024-09-23 MED ORDER — INSULIN GLARGINE 100 UNIT/ML ~~LOC~~ SOLN
60.0000 [IU] | Freq: Every day | SUBCUTANEOUS | Status: DC
  Administered 2024-09-23 – 2024-09-24 (×2): 60 [IU] via SUBCUTANEOUS
  Filled 2024-09-23 (×3): qty 0.6

## 2024-09-23 MED ORDER — PRAMIPEXOLE DIHYDROCHLORIDE 1 MG PO TABS
1.0000 mg | ORAL_TABLET | Freq: Three times a day (TID) | ORAL | Status: DC
  Administered 2024-09-23 – 2024-09-25 (×6): 1 mg via ORAL
  Filled 2024-09-23 (×9): qty 1

## 2024-09-23 MED ORDER — VANCOMYCIN HCL IN DEXTROSE 1-5 GM/200ML-% IV SOLN: 1000.0000 mg | Freq: Once | INTRAVENOUS | Status: DC

## 2024-09-23 MED ORDER — BASAGLAR KWIKPEN 100 UNIT/ML ~~LOC~~ SOPN: 60.0000 [IU] | PEN_INJECTOR | Freq: Every day | SUBCUTANEOUS | Status: DC

## 2024-09-23 MED ORDER — DILTIAZEM HCL ER COATED BEADS 180 MG PO CP24
360.0000 mg | ORAL_CAPSULE | Freq: Every day | ORAL | Status: DC
  Administered 2024-09-23 – 2024-09-25 (×3): 360 mg via ORAL
  Filled 2024-09-23: qty 2
  Filled 2024-09-23 (×2): qty 3

## 2024-09-23 MED ORDER — SODIUM CHLORIDE 0.9 % IV SOLN
2.0000 g | Freq: Once | INTRAVENOUS | Status: AC
  Administered 2024-09-23: 2 g via INTRAVENOUS
  Filled 2024-09-23: qty 12.5

## 2024-09-23 MED ORDER — ONDANSETRON HCL 4 MG/2ML IJ SOLN
4.0000 mg | Freq: Once | INTRAMUSCULAR | Status: AC
  Administered 2024-09-23: 4 mg via INTRAVENOUS
  Filled 2024-09-23: qty 2

## 2024-09-23 MED ORDER — GABAPENTIN 400 MG PO CAPS
800.0000 mg | ORAL_CAPSULE | Freq: Every day | ORAL | Status: DC
  Administered 2024-09-23 – 2024-09-24 (×2): 800 mg via ORAL
  Filled 2024-09-23 (×2): qty 2

## 2024-09-23 MED ORDER — ONDANSETRON HCL 4 MG PO TABS: 4.0000 mg | ORAL_TABLET | Freq: Four times a day (QID) | ORAL | Status: DC | PRN

## 2024-09-23 MED ORDER — ENOXAPARIN SODIUM 40 MG/0.4ML IJ SOSY: 40.0000 mg | PREFILLED_SYRINGE | INTRAMUSCULAR | Status: DC

## 2024-09-23 MED ORDER — SPIRONOLACTONE 25 MG PO TABS
50.0000 mg | ORAL_TABLET | Freq: Every day | ORAL | Status: DC
  Administered 2024-09-24 – 2024-09-25 (×2): 50 mg via ORAL
  Filled 2024-09-23 (×2): qty 2

## 2024-09-23 MED ORDER — FENTANYL CITRATE (PF) 50 MCG/ML IJ SOSY
100.0000 ug | PREFILLED_SYRINGE | Freq: Once | INTRAMUSCULAR | Status: AC
  Administered 2024-09-23: 100 ug via INTRAVENOUS
  Filled 2024-09-23: qty 2

## 2024-09-23 MED ORDER — ALBUTEROL SULFATE (2.5 MG/3ML) 0.083% IN NEBU
3.0000 mL | INHALATION_SOLUTION | RESPIRATORY_TRACT | Status: DC | PRN
  Administered 2024-09-24: 3 mL via RESPIRATORY_TRACT
  Filled 2024-09-23: qty 3

## 2024-09-23 MED ORDER — ISOSORBIDE MONONITRATE ER 60 MG PO TB24
60.0000 mg | ORAL_TABLET | Freq: Every day | ORAL | Status: DC
  Administered 2024-09-24 – 2024-09-25 (×2): 60 mg via ORAL
  Filled 2024-09-23 (×2): qty 1

## 2024-09-23 MED ORDER — ONDANSETRON HCL 4 MG/2ML IJ SOLN
4.0000 mg | Freq: Four times a day (QID) | INTRAMUSCULAR | Status: DC | PRN
  Administered 2024-09-23: 4 mg via INTRAVENOUS
  Filled 2024-09-23: qty 2

## 2024-09-23 MED ORDER — OXYCODONE HCL 5 MG PO TABS
10.0000 mg | ORAL_TABLET | Freq: Four times a day (QID) | ORAL | Status: DC | PRN
  Administered 2024-09-23: 10 mg via ORAL
  Filled 2024-09-23: qty 2

## 2024-09-23 MED ORDER — LEVOTHYROXINE SODIUM 100 MCG PO TABS
200.0000 ug | ORAL_TABLET | Freq: Every day | ORAL | Status: DC
  Administered 2024-09-24 – 2024-09-25 (×2): 200 ug via ORAL
  Filled 2024-09-23 (×2): qty 2

## 2024-09-23 MED ORDER — METFORMIN HCL ER 500 MG PO TB24
1000.0000 mg | ORAL_TABLET | Freq: Two times a day (BID) | ORAL | Status: DC
  Administered 2024-09-25 (×2): 1000 mg via ORAL
  Filled 2024-09-23 (×2): qty 2

## 2024-09-23 MED ORDER — OXYCODONE HCL 5 MG PO TABS
10.0000 mg | ORAL_TABLET | ORAL | Status: AC | PRN
  Administered 2024-09-23 – 2024-09-24 (×5): 10 mg via ORAL
  Filled 2024-09-23 (×5): qty 2

## 2024-09-23 MED ORDER — GABAPENTIN 400 MG PO CAPS
400.0000 mg | ORAL_CAPSULE | Freq: Two times a day (BID) | ORAL | Status: DC
  Administered 2024-09-23 – 2024-09-25 (×5): 400 mg via ORAL
  Filled 2024-09-23 (×5): qty 1

## 2024-09-23 MED ORDER — HYDROMORPHONE HCL 1 MG/ML IJ SOLN
1.0000 mg | INTRAMUSCULAR | Status: AC | PRN
  Administered 2024-09-23 (×2): 1 mg via INTRAVENOUS
  Filled 2024-09-23 (×2): qty 1

## 2024-09-23 MED ORDER — ACETAMINOPHEN 325 MG PO TABS
650.0000 mg | ORAL_TABLET | Freq: Four times a day (QID) | ORAL | Status: DC | PRN
  Administered 2024-09-23 – 2024-09-25 (×3): 650 mg via ORAL
  Filled 2024-09-23 (×3): qty 2

## 2024-09-23 MED ORDER — APIXABAN 5 MG PO TABS
5.0000 mg | ORAL_TABLET | Freq: Two times a day (BID) | ORAL | Status: DC
  Administered 2024-09-23 – 2024-09-25 (×4): 5 mg via ORAL
  Filled 2024-09-23 (×4): qty 1

## 2024-09-23 MED ORDER — VITAMIN D (ERGOCALCIFEROL) 1.25 MG (50000 UNIT) PO CAPS: 50000.0000 [IU] | ORAL_CAPSULE | ORAL | Status: DC

## 2024-09-23 MED ORDER — VANCOMYCIN HCL 10 G IV SOLR
2500.0000 mg | Freq: Once | INTRAVENOUS | Status: AC
  Administered 2024-09-23: 2500 mg via INTRAVENOUS
  Filled 2024-09-23: qty 2500

## 2024-09-23 MED ORDER — PANTOPRAZOLE SODIUM 40 MG PO TBEC
40.0000 mg | DELAYED_RELEASE_TABLET | Freq: Every day | ORAL | Status: DC
  Administered 2024-09-23 – 2024-09-25 (×3): 40 mg via ORAL
  Filled 2024-09-23 (×3): qty 1

## 2024-09-23 MED ORDER — ACETAMINOPHEN 650 MG RE SUPP: 650.0000 mg | Freq: Four times a day (QID) | RECTAL | Status: DC | PRN

## 2024-09-23 MED ORDER — SODIUM CHLORIDE 0.9 % IV SOLN
2.0000 g | Freq: Three times a day (TID) | INTRAVENOUS | Status: DC
  Administered 2024-09-23 – 2024-09-25 (×6): 2 g via INTRAVENOUS
  Filled 2024-09-23 (×6): qty 12.5

## 2024-09-23 NOTE — Sepsis Progress Note (Signed)
 Elink monitoring for the code sepsis protocol.

## 2024-09-23 NOTE — H&P (Signed)
 " History and Physical    Patient: [name] DOB: 05/25/1961 DOA: 09/23/2024 DOS: the patient was seen and examined on 09/23/2024 PCP: [name], DO  Patient coming from: Home  Chief Complaint:   HPI: [name] is a 64 y.o. female with medical history significant of aortic stenosis, asthma, chronic diastolic CHF, chronic lower back pain, chronic neck pain, chronic pain syndrome, fibromyalgia, mixed connective tissue disorder, rheumatoid arthritis, Sjogren syndrome, spondylosis, CAD, colon polyps, eczema, sepsis daytime sleepiness, gallstones, GERD, hiatal hernia, heart murmur, history of oophorectomy, gout, hyperlipidemia, hypertension, hypothyroidism, migraine headaches, history of pneumonia, NAFLD, PVCs, sleep apnea, type 2 diabetes, obesity, chronic indwelling catheter who presented to the emergency department [facility] with fever, nausea and that he urine sediment in the Foley catheter bag.  She received 650 mg of acetaminophen  and 750 mL of LR bolus by EMS.  No chest pain, palpitations, diaphoresis, PND, orthopnea or pitting edema of the lower extremities.  No abdominal pain, nausea, emesis, diarrhea, constipation, melena or hematochezia.  No flank pain, dysuria, frequency or hematuria.  No polyuria, polydipsia, polyphagia or blurred vision.   Lab work: Urinalysis was hazy with greater than 500 glucose and ketones of 20 mg/dL.  There was moderate hemoglobin, moderate nitrates and moderate leukocyte esterase.  21-50 RBC, 21-50 WBC, rare bacteria and presence of budding yeast on microscopic examination.  CBC showed a white count 23.9, hemoglobin 15.1 g/dL and platelets 776.  PT was 16.2 and INR 1.2.  CMP showed a glucose of 262 mg/dL, the rest of the CMP measurements were normal after sodium/chloride correction.  Lactic acid is 2.4 then 2.8 mmol/L.  Coronavirus, influenza and RSV PCR test is negative.  Imaging: Portable 1 view chest radiograph with no acute  findings.  Cardiomegaly.  CT abdomen/pelvis with contrast with no acute abdominal or cervical process to account for the patient's sepsis.  There are morphologic features of cirrhosis of the liver.  Aortic atherosclerotic calcification.   ED course: Initial vital signs were temperature 98.6 F, pulse 112, respirations 20, BP 120/59 mmHg and O2 sat 94% on room air.  The patient received LR 2000 mL liter bolus followed by LR 150 mL/h x 20 hours, cefepime  2 g IVPB, fentanyl  100 mcg IVP, ondansetron  4 mg IVP and vancomycin  2500 mg IVPB.  Review of Systems: As mentioned in the history of present illness. All other systems reviewed and are negative. Past Medical History:  Diagnosis Date   Aortic stenosis    mild AS by echo 02/2021   Asthma    on daily RX and rescue inhaler (04/18/2018)   Chronic diastolic CHF (congestive heart failure) (HCC) 09/2015   Chronic lower back pain    Chronic neck pain    Chronic pain syndrome    Fentanyl  Patch   Colon polyps    Coronary artery disease    cath with normal LM, 30% LAD, 85% mid RCA and 95% distal RCA s/p PCI of the mid to distal RCA and now on DAPT with ASA and Ticagrelor .     Eczema    Excessive daytime sleepiness 11/26/2015   Fibromyalgia    Gallstones    GERD (gastroesophageal reflux disease)    Heart murmur    noted for the 1st time on 04/18/2018   History of blood transfusion 07/2010   S/P oophorectomy   History of gout    History of hiatal hernia 1980s   gone now (04/18/2018)   Hyperlipidemia    Hypertension  takes Metoprolol  and Enalapril  daily   Hypothyroidism    takes Synthroid  daily   IBS (irritable bowel syndrome)    Migraine    nothing in the 2000s (04/18/2018)   Mixed connective tissue disease    NAFLD (nonalcoholic fatty liver disease)    Pneumonia    several times (04/18/2018)   PVC's (premature ventricular contractions)    noted on event monitor 02/2021   Rheumatoid arthritis (HCC)    hands, elbows, shoulders,  probably knees (04/18/2018)   Scoliosis    Sjogren's syndrome    Sleep apnea    mild - does not use cpap   Spondylosis    Type II diabetes mellitus (HCC)    takes Metformin  and Hum R daily (04/18/2018)   Walker as ambulation aid    also uses wheelchair   Past Surgical History:  Procedure Laterality Date   ABDOMINAL HYSTERECTOMY  06/2005   w/right ovariy   AMPUTATION Right 01/28/2020    RIGHT FOURTH AND FIFTH RAY AMPUTATION   AMPUTATION Right 01/28/2020   Procedure: RIGHT FOURTH AND FIFTH RAY AMPUTATION,;  Surgeon: Harden Jerona GAILS, MD;  Location: [facility] OR;  Service: Orthopedics;  Laterality: Right;   AMPUTATION Right 06/01/2021   Procedure: RIGHT BELOW KNEE AMPUTATION;  Surgeon: Harden Jerona GAILS, MD;  Location: [facility] OR;  Service: Orthopedics;  Laterality: Right;   AMPUTATION Right 07/22/2021   Procedure: RIGHT ABOVE KNEE AMPUTATION;  Surgeon: Harden Jerona GAILS, MD;  Location: [facility] OR;  Service: Orthopedics;  Laterality: Right;   APPENDECTOMY     BREAST BIOPSY Bilateral    9 total (04/18/2018)   CORONARY ANGIOPLASTY WITH STENT PLACEMENT  10/01/2015   normal LM, 30% LAD, 85% mid RCA and 95% distal RCA s/p PCI of the mid to distal RCA and now on DAPT with ASA and Ticagrelor .     CORONARY STENT INTERVENTION Right 04/18/2018   Procedure: CORONARY STENT INTERVENTION;  Surgeon: Verlin Lonni BIRCH, MD;  Location: [facility] INVASIVE CV LAB;  Service: Cardiovascular;  Laterality: Right;   DILATION AND CURETTAGE OF UTERUS     FRACTURE SURGERY     LAPAROSCOPIC CHOLECYSTECTOMY     LEFT HEART CATH AND CORONARY ANGIOGRAPHY N/A 04/18/2018   Procedure: LEFT HEART CATH AND CORONARY ANGIOGRAPHY;  Surgeon: Verlin Lonni BIRCH, MD;  Location: [facility];  Service: Cardiovascular;  Laterality: N/A;   MUSCLE BIOPSY Left    leg   OOPHORECTOMY  07/2010   RADIOLOGY WITH ANESTHESIA N/A 05/25/2016   Procedure: RADIOLOGY WITH ANESTHESIA;  Surgeon: Medication Radiologist, MD;  Location: [facility] OR;  Service: Radiology;   Laterality: N/A;   RIGHT HEART CATH N/A 09/26/2023   Procedure: RIGHT HEART CATH;  Surgeon: Elmira Newman PARAS, MD;  Location: [facility];  Service: Cardiovascular;  Laterality: N/A;   STUMP REVISION Right 06/29/2021   Procedure: REVISION RIGHT BELOW KNEE AMPUTATION;  Surgeon: Harden Jerona GAILS, MD;  Location: [facility] OR;  Service: Orthopedics;  Laterality: Right;   TEE WITHOUT CARDIOVERSION N/A 06/01/2021   Procedure: TRANSESOPHAGEAL ECHOCARDIOGRAM (TEE);  Surgeon: Barbaraann Darryle Ned, MD;  Location: [facility] OR;  Service: Cardiovascular;  Laterality: N/A;   TONSILLECTOMY AND ADENOIDECTOMY     WRIST FRACTURE SURGERY Left    crushed it   Social History:  reports that she quit smoking about 20 years ago. Her smoking use included cigarettes. She started smoking about 40 years ago. She has a 19.4 pack-year smoking history. She has been exposed to tobacco smoke. She has never used smokeless  tobacco. She reports current alcohol  use. She reports that she does not currently use drugs after having used the following drugs: Marijuana.  Allergies[1]  Family History  Problem Relation Age of Onset   CAD Mother    Hypertension Mother    Heart attack Mother    Stroke Mother    CAD Father    Heart attack Father    Allergic rhinitis Father    Asthma Father    Hypertension Brother    Hypertension Brother    Pancreatic cancer Paternal Aunt    Breast cancer Paternal Aunt    Lung cancer Paternal Aunt    Allergic rhinitis Son    Asthma Son     Prior to Admission medications  Medication Sig Start Date End Date Taking? Authorizing Provider  acetaminophen  (TYLENOL ) 500 MG tablet Take 1,000 mg by mouth as needed for mild pain (pain score 1-3).    [provider]  albuterol  (PROVENTIL ) (2.5 MG/3ML) 0.083% nebulizer solution USE 1 VIAL IN NEBULIZER EVERY 4 HOURS AS NEEDED FOR WHEEZING FOR SHORTNESS OF BREATH 09/29/22   Wendling, [name], DO  albuterol  (VENTOLIN  HFA) 108 (90 Base) MCG/ACT  inhaler Inhale 2 puffs into the lungs every 4 (four) hours as needed for wheezing or shortness of breath. 07/10/24   [name], DO  augmented betamethasone  dipropionate (DIPROLENE -AF) 0.05 % cream Apply twice daily as needed. 06/06/24   [name], DO  Continuous Glucose Sensor (FREESTYLE LIBRE 3 PLUS SENSOR) MISC Change sensor every 15 days. 09/08/24   [name], DO  dapagliflozin  propanediol (FARXIGA ) 10 MG TABS tablet Take 1 tablet (10 mg total) by mouth daily before breakfast. 04/29/24   Wendling, [name], DO  diclofenac  (FLECTOR ) 1.3 % PTCH Place 1 patch onto the skin 2 (two) times daily. 08/21/24   [name], DO  diclofenac  Sodium (PENNSAID ) 2 % SOLN Apply 2 Pump (40 mg total) topically 2 (two) times daily as needed. 1 application to back twice daily 10/10/23   [name], DO  diltiazem  (CARDIZEM  CD) 360 MG 24 hr capsule Take 1 capsule (360 mg total) by mouth daily. 08/18/24   [name], DO  ELIQUIS  5 MG TABS tablet Take 1 tablet by mouth twice daily 09/09/24   [name], DO  EPINEPHrine  0.3 mg/0.3 mL IJ SOAJ injection Inject 0.3 mg into the muscle as needed for anaphylaxis. 06/27/24   [name], DO  ferrous sulfate  (FEROSUL) 325 (65 FE) MG tablet Take 1 tablet (325 mg total) by mouth daily with breakfast. 04/29/24   Frann, [name], DO  fluconazole  (DIFLUCAN ) 150 MG tablet TAKE 1 TABLET BY MOUTH NOW, REPEAT  IN  72  HOURS  IF  NO  IMPROVEMENT 09/08/24   Wendling, [name], DO  fluticasone  (FLONASE ) 50 MCG/ACT nasal spray Place 2 sprays into both nostrils daily as needed for allergies or rhinitis. 06/27/24   [name], DO  fluticasone -salmeterol (ADVAIR) 100-50 MCG/ACT AEPB Inhale 1 puff into the lungs 2 (two) times daily. 08/18/24   [name], DO  furosemide  (LASIX ) 40 MG tablet Take 1 tablet (40 mg total) by mouth daily. 06/06/24   [name], DO  gabapentin  (NEURONTIN ) 400 MG capsule Take 1 capsule (400 mg total) by mouth 4 (four) times daily. TAKE 1 CAPSULE BY MOUTH IN THE MORNING AND 1 AT NOON AND 2 AT BEDTIME Strength: 400 mg 06/06/24   [name], DO  gentamicin ointment (GARAMYCIN)  0.1 % Apply 1 Application topically in the morning and at bedtime. 07/23/23   [provider]  Glucagon , rDNA, (GLUCAGON  EMERGENCY) 1 MG KIT Inject 1 mg into the vein daily as needed. 10/10/23   [name], DO  Insulin  Glargine (BASAGLAR  KWIKPEN) 100 UNIT/ML Inject 60 Units into the skin at bedtime. 07/11/24   [name], DO  insulin  lispro (HUMALOG  KWIKPEN) 200 UNIT/ML KwikPen Inject 60 Units into the skin 3 (three) times daily with meals. 08/18/24   [name], DO  Insulin  Syringe-Needle U-100 (INSULIN  SYRINGE .5CC/30GX5/16) 30G X 5/16 0.5 ML MISC Take as directed with Novolog  inuslin.  15-20 units per day 02/20/24   [name], DO  Insulin  Syringes, Disposable, U-100 0.5 ML MISC 100 each by Does not apply route 4 (four) times daily -  before meals and at bedtime. 10/01/23   Amin, Ankit C, MD  isosorbide  mononitrate (IMDUR ) 60 MG 24 hr tablet Take 1 tablet (60 mg total) by mouth daily. 08/18/24   [name], DO  levocetirizine (XYZAL ) 5 MG tablet Take 1 tablet (5 mg total) by mouth every evening. 07/11/24   [name], DO  levothyroxine  (SYNTHROID ) 200 MCG tablet Take 1 tablet (200 mcg total) by mouth daily before breakfast. 07/11/24   Wendling, [name], DO  lidocaine  (LIDODERM ) 5 % Place 1 patch onto the skin every 12 (twelve) hours. Remove & Discard patch within 12 hours or as directed 04/12/23   Debby Fidela CROME, NP  lidocaine  (XYLOCAINE ) 5 % ointment Apply 1 Application topically in the morning and at bedtime. 02/02/23   [provider]  metFORMIN  (GLUCOPHAGE -XR) 500 MG 24 hr tablet Take 2 tablets (1,000 mg total) by mouth 2 (two)  times daily. 08/18/24   [name], DO  Multiple Vitamins-Minerals (WOMENS 50+ MULTI VITAMIN PO) Take 1 tablet by mouth in the morning and at bedtime.    [provider]  mupirocin  ointment (BACTROBAN ) 2 % APPLY  OINTMENT TOPICALLY TWICE DAILY 09/08/24   [name], DO  naloxone  (NARCAN ) nasal spray 4 mg/0.1 mL Place 1 spray into the nose as needed (opoid overdose). 10/10/23   [name], DO  nitroGLYCERIN  (NITROSTAT ) 0.4 MG SL tablet Place 1 tablet (0.4 mg total) under the tongue every 5 (five) minutes as needed for chest pain. 10/10/23   [name], DO  nystatin  (MYCOSTATIN /NYSTOP ) powder Apply 1 Application topically as needed.    [provider]  omeprazole  (PRILOSEC) 40 MG capsule Take 1 capsule (40 mg total) by mouth daily. 08/18/24   [name], DO  OneTouch Delica Lancets 33G MISC Use to check blood glucose up to 4 times a day 11/16/22   [name], DO  [facility] VERIO test strip USE AS DIRECTED 09/09/24   [name], DO  Oxycodone  HCl 20 MG TABS Take 1 tablet (20 mg total) by mouth in the morning, at noon, and at bedtime. 07/11/24   Wendling, [name], DO  Polyethyl Glyc-Propyl Glyc PF (SYSTANE PRESERVATIVE FREE) 0.4-0.3 % SOLN Place 2 drops into both eyes in the morning and at bedtime. 05/11/16   [provider]  pramipexole  (MIRAPEX ) 1 MG tablet Take 1 tablet (1 mg total) by mouth 3 (three) times daily. 08/18/24   [name], DO  promethazine  (PHENERGAN ) 25 MG tablet Take 1 tablet (25 mg total) by mouth as needed for vomiting or nausea. 06/06/24   [name], DO  spironolactone  (ALDACTONE )  50 MG tablet Take 1 tablet by mouth once daily 09/09/24   [name], DO  tirzepatide  (MOUNJARO ) 7.5 MG/0.5ML Pen Inject 7.5 mg into the skin once a week. 08/18/24   [name], DO  Vitamin D , Ergocalciferol , (DRISDOL ) 1.25 MG (50000 UNIT) CAPS  capsule Take 1 capsule by mouth once a week 08/21/24   [name], DO    Physical Exam: Vitals:   09/23/24 0351 09/23/24 0737  BP: (!) 120/59 111/60  Pulse: (!) 112 91  Resp: 20 16  Temp: 98.6 F (37 C) 98.5 F (36.9 C)  TempSrc: Oral Oral  SpO2: 94% 97%   Physical Exam Vitals and nursing note reviewed.  Constitutional:      General: She is awake. She is not in acute distress.    Appearance: Normal appearance. She is ill-appearing.  HENT:     Head: Normocephalic.     Nose: No rhinorrhea.     Mouth/Throat:     Mouth: Mucous membranes are moist.  Eyes:     General: No scleral icterus.    Pupils: Pupils are equal, round, and reactive to light.  Neck:     Vascular: No JVD.  Cardiovascular:     Rate and Rhythm: Normal rate and regular rhythm.     Heart sounds: S1 normal and S2 normal.  Pulmonary:     Effort: Pulmonary effort is normal.     Breath sounds: Normal breath sounds. No wheezing, rhonchi or rales.  Abdominal:     General: Bowel sounds are normal. There is no distension.     Palpations: Abdomen is soft.     Tenderness: There is no abdominal tenderness. There is no right CVA tenderness or left CVA tenderness.  Musculoskeletal:     Cervical back: Neck supple.     Right lower leg: No edema.     Left lower leg: No edema.  Skin:    General: Skin is warm and dry.  Neurological:     General: No focal deficit present.     Mental Status: She is alert and oriented to person, place, and time.  Psychiatric:        Mood and Affect: Mood normal.        Behavior: Behavior normal. Behavior is cooperative.     Data Reviewed:  Results are pending, will review when available.  03/04/2021 echocardiogram report. IMPRESSIONS:   1. Left ventricular ejection fraction, by estimation, is 60 to 65%. The  left ventricle has normal function. The left ventricle has no regional  wall motion abnormalities. Left ventricular diastolic parameters are  consistent with  Grade II diastolic  dysfunction (pseudonormalization). Elevated left ventricular end-diastolic  pressure.   2. Right ventricular systolic function is normal. The right ventricular  size is normal.   3. Left atrial size was mildly dilated.   4. The mitral valve is normal in structure. Trivial mitral valve  regurgitation. No evidence of mitral stenosis.   5. The aortic valve was not well visualized. There is moderate  calcification of the aortic valve. There is moderate thickening of the  aortic valve. Aortic valve regurgitation is trivial. Mild aortic valve  stenosis.   6. The inferior vena cava is normal in size with greater than 50%  respiratory variability, suggesting right atrial pressure of 3 mmHg.   Comparison(s): 04/09/18 EF 60-65%.   09/12/2023 TTE REPORT: IMPRESSIONS:   1. Left ventricular ejection fraction, by estimation, is 60 to 65%. The  left ventricle has normal  function. Left ventricular endocardial border  not optimally defined to evaluate regional wall motion. Left ventricular  diastolic parameters are  indeterminate.   2. RV not well visualized, grossly appears normal in size and function. .  Right ventricular systolic function was not well visualized. The right  ventricular size is not well visualized. Tricuspid regurgitation signal is  inadequate for assessing PA  pressure.   3. The mitral valve was not well visualized. No evidence of mitral valve  regurgitation. No evidence of mitral stenosis.   4. The aortic valve was not well visualized. Aortic valve regurgitation  is not visualized. No aortic stenosis is present.   5. Technically difficult study, poor visualization.   EKG: Vent. rate 98 BPM  PR interval 188 ms  QRS duration 97 ms  QT/QTcB 374/480 ms  P-R-T axes 53 6 60  Sinus rhythm  Low voltage, extremity and precordial leads  Consider anterior infarct  Assessment and Plan: Principal Problem:   Sepsis secondary to UTI (HCC) Admit to  telemetry/inpatient. Continue IV fluids. Continue cefepime  2 g every 8 hours.   Continue vancomycin  per pharmacy. Follow urine culture and sensitivity. Follow-up blood culture and sensitivity Follow CBC and CMP in a.m.  Active Problems:   Hypothyroidism Continue levothyroxine  200 mcg po daily.    Type 2 diabetes mellitus with hyperglycemia,    with long-term current use of insulin  (HCC) Carbohydrate modified diet. CBG monitoring QID. Continue Basaglar  60 units at bedtime. Continue insulin  lispro 60 units p.o. 3 times daily. Check hemoglobin A1c.    Essential hypertension Continue diltiazem  360 mg p.o. daily. Continue spironolactone  50 mg p.o. daily. Continue isosorbide  mononitrate 60 mg p.o. daily.    CAD S/P percutaneous coronary angioplasty Continue Imdur  as above. Intolerant to statins. On DOAC for A-fib.    Chronic heart failure with preserved ejection fraction (HCC) Continue CCB, diuretic and nitrates as above.    Paroxysmal atrial fibrillation (HCC) Continue apixaban  5 mg p.o. twice daily. On diltiazem  and anastrozole for rate control.    Class 3 obesity (HCC) Current BMI pending. Would benefit from lifestyle modifications. Follow-up closely with PCP and/or bariatric clinic.    Hyperlipidemia Intolerant to statins. Follow-up with PCP for treatment options.    Gastroesophageal reflux disease Continue home PPI or formulary equivalent    OSA Stated she is CPAP intolerant. Declined to use CPAP in the hospital.    Advance Care Planning:   Code Status: Full Code   Consults:   Family Communication:   Severity of Illness: The appropriate patient status for this patient is INPATIENT. Inpatient status is judged to be reasonable and necessary in order to provide the required intensity of service to ensure the patient's safety. The patient's presenting symptoms, physical exam findings, and initial radiographic and laboratory data in the context of their chronic  comorbidities is felt to place them at high risk for further clinical deterioration. Furthermore, it is not anticipated that the patient will be medically stable for discharge from the hospital within 2 midnights of admission.   * I certify that at the point of admission it is my clinical judgment that the patient will require inpatient hospital care spanning beyond 2 midnights from the point of admission due to high intensity of service, high risk for further deterioration and high frequency of surveillance required.*  Author: Alm Dorn Castor, MD 09/23/2024 8:29 AM  For on call review [email or web address].   This document was prepared using Dragon voice recognition software and may contain some unintended  transcription errors.     [1]  Allergies Allergen Reactions   Fish Allergy  Anaphylaxis    INCLUDES OMEGA 3 OILS   Fish Oil Anaphylaxis and Swelling    THROAT SWELLS INCLUDES FISH AS A CLASS   Gabapentin  Anxiety and Other (See Comments)    Anxiety on high doses, Angioedema   Ipratropium-Albuterol  Shortness Of Breath     Bronchospasm, Wheezing becomes worse, can take albuterol  with ipratropium   Omega-3 Fatty Acids Swelling    Throat swelling, Has tolerated Glucerna nutritional drinks     Peanut -Containing Drug Products Shortness Of Breath   Victoza  [Liraglutide ] Nausea And Vomiting    Patient reported severe, debilitating nausea   Lovastatin Other (See Comments)    EXTREME JOINT PAIN   Metronidazole  Nausea And Vomiting and Swelling    Headache and shakes, Pt tolerating IV 06/14/22    Red Yeast Rice [Cholestin] Other (See Comments)    Muscle pain and severe joint pain.    Rosuvastatin  Other (See Comments)    Extreme joint pain, to this & other statins    Rotigotine  Swelling    (Neupro ) Extreme edema    Atrovent Hfa [Ipratropium Bromide Hfa] Other (See Comments)    wheezing   Iodine  Swelling    Pt specifies allergen was Povidone-iodine  10 % swab. The reaction was localized  to region (BLE) where topical iodine  was being applied.    Ipratropium Bromide Other (See Comments)    Wheezing becomes worse   Morphine     Other Other (See Comments)    Surgical Staples causes redness/infection per patient.   Pepto-Bismol [Bismuth Subsalicylate] Nausea And Vomiting    Extreme vomiting   Wound Dressing Adhesive Other (See Comments)    Electrodes causes skin breakdown, Electrodes causes skin breakdown   Enalapril  Cough   Metoprolol  Palpitations   Suprep [Na Sulfate-K Sulfate-Mg Sulf] Nausea And Vomiting   Tape Other (See Comments)    Electrodes causes skin breakdown   "

## 2024-09-23 NOTE — ED Triage Notes (Signed)
 Pt BIB [facility] from home. Pt has foley catheter with dirty sediment. Pt reports having fever and nausea. Pt took phenergan  at home. Ems administered 650 tylenol  750 LR.   103.2 90%RA 114HR 120/58

## 2024-09-23 NOTE — ED Notes (Signed)
 Pt repositioned in the bed, pt needs addressed. Bedding changed

## 2024-09-23 NOTE — ED Provider Notes (Signed)
 I took over care of patient at 7 AM pending remainder of workup and CT scan and influenza. Her lactic went up to 2.8 from 2.4. She received IVF and antibiotics.  Physical Exam  BP 111/60 (BP Location: Left Arm)   Pulse 91   Temp 98.5 F (36.9 C) (Oral)   Resp 16   SpO2 97%    ED Course / MDM   Clinical Course as of 09/23/24 0845  Tue Sep 23, 2024  0448 White count of 23.  Cefepime  and vancomycin  ordered for broad-spectrum antibiotic coverage. [CH]  0649 Lactate marginally elevated.  Urinalysis is nitrite positive with white cells but could easily be colonized.  Does not clearly provide a source for fever and leukocytosis to 23.9.  CT imaging was ordered.  Patient given 2 L of fluid.  This would appropriate fluid resuscitation for a 5 foot 2 female and is greater than 30 cc/kg ideal body weight.  She is not in shock but has some mild persistent tachycardia. [CH]    Clinical Course User Index [CH] Horton, Charmaine FALCON, MD   Medical Decision Making Patient CT abdomen pelvis and flu are negative.  Patient has a significant leukocytosis and increasing lactic acid suspicious for sepsis.  Was given IV fluid bolus and antibiotics.  She has an indwelling Foley catheter and may have a UTI.  Discussed patient's case with hospitalist and Dr. Celinda will admit for further workup and management.  Will have nursing staff change out the Foley catheter at this time.  Problems Addressed: Sepsis, due to unspecified organism, unspecified whether acute organ dysfunction present [facility]): acute illness or injury that poses a threat to life or bodily functions Urinary tract infection without hematuria, site unspecified: acute illness or injury that poses a threat to life or bodily functions  Amount and/or Complexity of Data Reviewed External Data Reviewed: notes. Labs: ordered. Radiology: ordered.  Risk OTC drugs. Prescription drug management. Drug therapy requiring intensive monitoring for toxicity. Decision  regarding hospitalization.          Gennaro Duwaine CROME, DO 09/23/24 [phone]

## 2024-09-23 NOTE — ED Notes (Signed)
 With assistance by three other ED staff, patient readjusted and moved up in bed.  VS obtained and recorded. PT tolerated fairly.  IV accidentally removed, pt states due to being sweaty all day.

## 2024-09-23 NOTE — ED Provider Notes (Signed)
 " [place] EMERGENCY DEPARTMENT AT [facility] Provider Note   CSN: [id number] Arrival date & time: 09/23/24  9658     Patient presents with: No chief complaint on file.   [name] is a 64 y.o. female.   HPI     This is a 64 year old female with a history of connective tissue disease, IBS, coronary artery disease, chronic pain syndrome, diabetes who presents with concern for fever and nausea.  Reports that she did not eat much yesterday.  Noted that her urine collecting in her Foley catheter was getting more concentrated and dark.  She reports fever at home to 103.  Reports nausea without vomiting.  No new abdominal pain but reports whole body pain..  Reports whole body pain but states she did not take her pain medications last night.  No nausea or vomiting.  Prior to Admission medications  Medication Sig Start Date End Date Taking? Authorizing Provider  acetaminophen  (TYLENOL ) 500 MG tablet Take 1,000 mg by mouth as needed for mild pain (pain score 1-3).    [provider]  albuterol  (PROVENTIL ) (2.5 MG/3ML) 0.083% nebulizer solution USE 1 VIAL IN NEBULIZER EVERY 4 HOURS AS NEEDED FOR WHEEZING FOR SHORTNESS OF BREATH 09/29/22   Wendling, [name], DO  albuterol  (VENTOLIN  HFA) 108 (90 Base) MCG/ACT inhaler Inhale 2 puffs into the lungs every 4 (four) hours as needed for wheezing or shortness of breath. 07/10/24   [name], DO  augmented betamethasone  dipropionate (DIPROLENE -AF) 0.05 % cream Apply twice daily as needed. 06/06/24   [name], DO  Continuous Glucose Sensor (FREESTYLE LIBRE 3 PLUS SENSOR) MISC Change sensor every 15 days. 09/08/24   [name], DO  dapagliflozin  propanediol (FARXIGA ) 10 MG TABS tablet Take 1 tablet (10 mg total) by mouth daily before breakfast. 04/29/24   Wendling, [name], DO  diclofenac  (FLECTOR ) 1.3 % PTCH Place 1 patch onto the skin 2 (two) times daily. 08/21/24   [name], DO  diclofenac  Sodium (PENNSAID ) 2 % SOLN Apply 2 Pump (40 mg total) topically 2 (two) times daily as needed. 1 application to back twice daily 10/10/23   [name], DO  diltiazem  (CARDIZEM  CD) 360 MG 24 hr capsule Take 1 capsule (360 mg total) by mouth daily. 08/18/24   [name], DO  ELIQUIS  5 MG TABS tablet Take 1 tablet by mouth twice daily 09/09/24   [name], DO  EPINEPHrine  0.3 mg/0.3 mL IJ SOAJ injection Inject 0.3 mg into the muscle as needed for anaphylaxis. 06/27/24   [name], DO  ferrous sulfate  (FEROSUL) 325 (65 FE) MG tablet Take 1 tablet (325 mg total) by mouth daily with breakfast. 04/29/24   Wendling, [name], DO  fluconazole  (DIFLUCAN ) 150 MG tablet TAKE 1 TABLET BY MOUTH NOW, REPEAT  IN  72  HOURS  IF  NO  IMPROVEMENT 09/08/24   [name], DO  fluticasone  (FLONASE ) 50 MCG/ACT nasal spray Place 2 sprays into both nostrils daily as needed for allergies or rhinitis. 06/27/24   [name], DO  fluticasone -salmeterol (ADVAIR) 100-50 MCG/ACT AEPB Inhale 1 puff into the lungs 2 (two) times daily. 08/18/24   [name], DO  furosemide  (LASIX ) 40 MG tablet Take 1 tablet (40 mg total) by mouth daily. 06/06/24   [name], DO  gabapentin  (NEURONTIN ) 400 MG capsule Take 1 capsule (400 mg total) by mouth 4 (four) times daily. TAKE 1 CAPSULE  BY MOUTH IN THE MORNING AND 1 AT [facility] AND 2 AT BEDTIME Strength: 400 mg 06/06/24   [name], DO  gentamicin ointment (GARAMYCIN) 0.1 % Apply 1 Application topically in the morning and at bedtime. 07/23/23   [provider]  Glucagon , rDNA, (GLUCAGON  EMERGENCY) 1 MG KIT Inject 1 mg into the vein daily as needed. 10/10/23   [name], DO  Insulin  Glargine (BASAGLAR  KWIKPEN) 100 UNIT/ML Inject 60 Units into the skin at bedtime. 07/11/24   [name], DO  insulin  lispro (HUMALOG  KWIKPEN)  200 UNIT/ML KwikPen Inject 60 Units into the skin 3 (three) times daily with meals. 08/18/24   [name], DO  Insulin  Syringe-Needle U-100 (INSULIN  SYRINGE .5CC/30GX5/16) 30G X 5/16 0.5 ML MISC Take as directed with Novolog  inuslin.  15-20 units per day 02/20/24   [name], DO  Insulin  Syringes, Disposable, U-100 0.5 ML MISC 100 each by Does not apply route 4 (four) times daily -  before meals and at bedtime. 10/01/23   Amin, Ankit C, MD  isosorbide  mononitrate (IMDUR ) 60 MG 24 hr tablet Take 1 tablet (60 mg total) by mouth daily. 08/18/24   [name], DO  levocetirizine (XYZAL ) 5 MG tablet Take 1 tablet (5 mg total) by mouth every evening. 07/11/24   [name], DO  levothyroxine  (SYNTHROID ) 200 MCG tablet Take 1 tablet (200 mcg total) by mouth daily before breakfast. 07/11/24   Wendling, [name], DO  lidocaine  (LIDODERM ) 5 % Place 1 patch onto the skin every 12 (twelve) hours. Remove & Discard patch within 12 hours or as directed 04/12/23   Debby Fidela CROME, NP  lidocaine  (XYLOCAINE ) 5 % ointment Apply 1 Application topically in the morning and at bedtime. 02/02/23   [provider]  metFORMIN  (GLUCOPHAGE -XR) 500 MG 24 hr tablet Take 2 tablets (1,000 mg total) by mouth 2 (two) times daily. 08/18/24   [name], DO  Multiple Vitamins-Minerals (WOMENS 50+ MULTI VITAMIN PO) Take 1 tablet by mouth in the morning and at bedtime.    [provider]  mupirocin  ointment (BACTROBAN ) 2 % APPLY  OINTMENT TOPICALLY TWICE DAILY 09/08/24   [name], DO  naloxone  (NARCAN ) nasal spray 4 mg/0.1 mL Place 1 spray into the nose as needed (opoid overdose). 10/10/23   [name], DO  nitroGLYCERIN  (NITROSTAT ) 0.4 MG SL tablet Place 1 tablet (0.4 mg total) under the tongue every 5 (five) minutes as needed for chest pain. 10/10/23   [name], DO  nystatin  (MYCOSTATIN /NYSTOP ) powder Apply 1  Application topically as needed.    [provider]  omeprazole  (PRILOSEC) 40 MG capsule Take 1 capsule (40 mg total) by mouth daily. 08/18/24   [name], DO  OneTouch Delica Lancets 33G MISC Use to check blood glucose up to 4 times a day 11/16/22   [name], DO  [facility] VERIO test strip USE AS DIRECTED 09/09/24   [name], DO  Oxycodone  HCl 20 MG TABS Take 1 tablet (20 mg total) by mouth in the morning, at noon, and at bedtime. 07/11/24   Wendling, [name], DO  Polyethyl Glyc-Propyl Glyc PF (SYSTANE PRESERVATIVE FREE) 0.4-0.3 % SOLN Place 2 drops into both eyes in the morning and at bedtime. 05/11/16   [provider]  pramipexole  (MIRAPEX ) 1 MG tablet Take 1 tablet (1 mg total) by mouth 3 (three) times daily. 08/18/24   [name], DO  promethazine  (  PHENERGAN ) 25 MG tablet Take 1 tablet (25 mg total) by mouth as needed for vomiting or nausea. 06/06/24   [name], DO  spironolactone  (ALDACTONE ) 50 MG tablet Take 1 tablet by mouth once daily 09/09/24   [name], DO  tirzepatide  (MOUNJARO ) 7.5 MG/0.5ML Pen Inject 7.5 mg into the skin once a week. 08/18/24   [name], DO  Vitamin D , Ergocalciferol , (DRISDOL ) 1.25 MG (50000 UNIT) CAPS capsule Take 1 capsule by mouth once a week 08/21/24   [name], DO    Allergies: Fish allergy , Fish oil, Gabapentin , Ipratropium-albuterol , Omega-3 fatty acids, Peanut -containing drug products, Victoza  [liraglutide ], Lovastatin, Metronidazole , Red yeast rice [cholestin], Rosuvastatin , Rotigotine , Atrovent hfa [ipratropium bromide hfa], Iodine , Ipratropium bromide, Morphine , Other, Pepto-bismol [bismuth subsalicylate], Wound dressing adhesive, Enalapril , Metoprolol , Suprep [na sulfate-k sulfate-mg sulf], and Tape    Review of Systems  Constitutional:  Positive for fever.  Respiratory:  Negative for shortness of breath.   Cardiovascular:   Negative for chest pain.  Gastrointestinal:  Negative for abdominal pain, nausea and vomiting.  Genitourinary:        Urine discoloration  Skin:  Positive for wound.  All other systems reviewed and are negative.   Updated Vital Signs BP (!) 120/59   Pulse (!) 112   Temp 98.6 F (37 C) (Oral)   Resp 20   SpO2 94%   Physical Exam Vitals and nursing note reviewed.  Constitutional:      Appearance: She is well-developed.     Comments: Morbidly obese  HENT:     Head: Normocephalic and atraumatic.     Mouth/Throat:     Mouth: Mucous membranes are moist.  Eyes:     Pupils: Pupils are equal, round, and reactive to light.  Cardiovascular:     Rate and Rhythm: Regular rhythm. Tachycardia present.     Heart sounds: Normal heart sounds.  Pulmonary:     Effort: Pulmonary effort is normal. No respiratory distress.     Breath sounds: No wheezing.  Abdominal:     General: Bowel sounds are normal.     Palpations: Abdomen is soft.     Tenderness: There is no abdominal tenderness.     Comments: Wound noted periumbilically with some serosanguineous drainage, no adjacent erythema  Genitourinary:    Comments: Foley catheter in place, no perineal redness or overlying skin changes Musculoskeletal:     Cervical back: Neck supple.     Comments: Right AKA  Skin:    General: Skin is warm and dry.  Neurological:     Mental Status: She is alert and oriented to person, place, and time.  Psychiatric:        Mood and Affect: Mood normal.     (all labs ordered are listed, but only abnormal results are displayed) Labs Reviewed  COMPREHENSIVE METABOLIC PANEL WITH GFR - Abnormal; Notable for the following components:      Result Value   Sodium 133 (*)    Chloride 94 (*)    Glucose, Bld 262 (*)    All other components within normal limits  CBC WITH DIFFERENTIAL/PLATELET - Abnormal; Notable for the following components:   WBC 23.9 (*)    RBC 5.33 (*)    Hemoglobin 15.1 (*)    HCT 47.0 (*)     Neutro Abs 22.3 (*)    Lymphs Abs 0.5 (*)    Abs Immature Granulocytes 0.26 (*)    All other components within normal limits  PROTIME-INR -  Abnormal; Notable for the following components:   Prothrombin Time 16.2 (*)    All other components within normal limits  URINALYSIS, W/ REFLEX TO CULTURE (INFECTION SUSPECTED) - Abnormal; Notable for the following components:   APPearance HAZY (*)    Glucose, UA >=500 (*)    Hgb urine dipstick MODERATE (*)    Ketones, ur 20 (*)    Nitrite POSITIVE (*)    Leukocytes,Ua MODERATE (*)    Bacteria, UA RARE (*)    All other components within normal limits  LACTIC ACID, PLASMA - Abnormal; Notable for the following components:   Lactic Acid, Venous 2.4 (*)    All other components within normal limits  CULTURE, BLOOD (ROUTINE X 2)  CULTURE, BLOOD (ROUTINE X 2)  RESP PANEL BY RT-PCR (RSV, FLU A&B, COVID)  RVPGX2  URINE CULTURE  LACTIC ACID, PLASMA  LACTIC ACID, PLASMA  I-STAT CG4 LACTIC ACID, ED  I-STAT CG4 LACTIC ACID, ED    EKG: None  Radiology: DG Chest Port 1 View Result Date: 09/23/2024 EXAM: 1 VIEW XRAY OF THE CHEST 09/23/2024 04:45:00 AM COMPARISON: Radiograph of the chest dated 09/17/2023. CLINICAL HISTORY: Questionable sepsis - evaluate for abnormality. FINDINGS: LUNGS AND PLEURA: No focal pulmonary opacity. No pleural effusion. No pneumothorax. HEART AND MEDIASTINUM: Cardiomegaly. BONES AND SOFT TISSUES: No acute osseous abnormality. IMPRESSION: 1. No acute findings. 2. Cardiomegaly. Electronically signed by: Evalene Coho MD MD 09/23/2024 05:00 AM EST RP Workstation: [id number]     Procedures   Medications Ordered in the ED  lactated ringers  infusion ( Intravenous New Bag/Given 09/23/24 0600)  vancomycin  (VANCOCIN ) 2,500 mg in sodium chloride  0.9 % 500 mL IVPB (2,500 mg Intravenous New Bag/Given 09/23/24 0557)  lactated ringers  bolus 1,000 mL (0 mLs Intravenous Stopped 09/23/24 0535)  ondansetron  (ZOFRAN ) injection 4 mg (4 mg  Intravenous Given 09/23/24 0507)  fentaNYL  (SUBLIMAZE ) injection 100 mcg (100 mcg Intravenous Given 09/23/24 0507)  lactated ringers  bolus 1,000 mL (0 mLs Intravenous Stopped 09/23/24 0605)  ceFEPIme  (MAXIPIME ) 2 g in sodium chloride  0.9 % 100 mL IVPB (0 g Intravenous Stopped 09/23/24 0534)    Clinical Course as of 09/23/24 0651  Tue Sep 23, 2024  0448 White count of 23.  Cefepime  and vancomycin  ordered for broad-spectrum antibiotic coverage. [CH]  0649 Lactate marginally elevated.  Urinalysis is nitrite positive with white cells but could easily be colonized.  Does not clearly provide a source for fever and leukocytosis to 23.9.  CT imaging was ordered.  Patient given 2 L of fluid.  This would appropriate fluid resuscitation for a 5 foot 2 female and is greater than 30 cc/kg ideal body weight.  She is not in shock but has some mild persistent tachycardia. [CH]    Clinical Course User Index [CH] Zaynah Chawla, Charmaine FALCON, MD                                 Medical Decision Making Amount and/or Complexity of Data Reviewed Labs: ordered. Radiology: ordered.  Risk Prescription drug management. Decision regarding hospitalization.   This patient presents to the ED for concern of fever pain, this involves an extensive number of treatment options, and is a complaint that carries with it a high risk of complications and morbidity.  I considered the following differential and admission for this acute, potentially life threatening condition.  The differential diagnosis includes viral illness such as COVID or influenza, UTI, sepsis  MDM:  This is a 64 year old female who presents with reported fevers and pain.  Reports change in urine in Foley bag.  She has allover body pain.  Chronic pain management.  At baseline chronically ill.  Labs obtained.  Leukocytosis to 23.  Mild lactic acidosis.  She was given 2 L of fluid which would be 30 cc/kg for ideal body weight.  Urinalysis does have white cells and is  nitrite positive.  Cover with broad-spectrum antibiotics.  CT pending.  Will rule out any other intra-abdominal process.  Will likely need admission for sepsis and UTI.  (Labs, imaging, consults)  Labs: I Ordered, and personally interpreted labs.  The pertinent results include: CBC, CMP, urinalysis, urine culture, COVID, influenza  Imaging Studies ordered: I ordered imaging studies including chest x-ray, CT pending I independently visualized and interpreted imaging. I agree with the radiologist interpretation  Additional history obtained from chart review.  External records from outside source obtained and reviewed including prior evaluations  Cardiac Monitoring: The patient was maintained on a cardiac monitor.  If on the cardiac monitor, I personally viewed and interpreted the cardiac monitored which showed an underlying rhythm of: Sinus  Reevaluation: After the interventions noted above, I reevaluated the patient and found that they have :stayed the same  Social Determinants of Health:  lives independently  Disposition: Likely admit, signed out to oncoming provider  Co morbidities that complicate the patient evaluation  Past Medical History:  Diagnosis Date   Aortic stenosis    mild AS by echo 02/2021   Asthma    on daily RX and rescue inhaler (04/18/2018)   Chronic diastolic CHF (congestive heart failure) (HCC) 09/2015   Chronic lower back pain    Chronic neck pain    Chronic pain syndrome    Fentanyl  Patch   Colon polyps    Coronary artery disease    cath with normal LM, 30% LAD, 85% mid RCA and 95% distal RCA s/p PCI of the mid to distal RCA and now on DAPT with ASA and Ticagrelor .     Eczema    Excessive daytime sleepiness 11/26/2015   Fibromyalgia    Gallstones    GERD (gastroesophageal reflux disease)    Heart murmur    noted for the 1st time on 04/18/2018   History of blood transfusion 07/2010   S/P oophorectomy   History of gout    History of hiatal  hernia 1980s   gone now (04/18/2018)   Hyperlipidemia    Hypertension    takes Metoprolol  and Enalapril  daily   Hypothyroidism    takes Synthroid  daily   IBS (irritable bowel syndrome)    Migraine    nothing in the 2000s (04/18/2018)   Mixed connective tissue disease    NAFLD (nonalcoholic fatty liver disease)    Pneumonia    several times (04/18/2018)   PVC's (premature ventricular contractions)    noted on event monitor 02/2021   Rheumatoid arthritis (HCC)    hands, elbows, shoulders, probably knees (04/18/2018)   Scoliosis    Sjogren's syndrome    Sleep apnea    mild - does not use cpap   Spondylosis    Type II diabetes mellitus (HCC)    takes Metformin  and Hum R daily (04/18/2018)   Walker as ambulation aid    also uses wheelchair     Medicines Meds ordered this encounter  Medications   lactated ringers  bolus 1,000 mL   ondansetron  (ZOFRAN ) injection 4 mg   fentaNYL  (SUBLIMAZE ) injection  100 mcg   lactated ringers  infusion   lactated ringers  bolus 1,000 mL    Reason 30 mL/kg dose is not being ordered:   First Lactic Acid Pending   ceFEPIme  (MAXIPIME ) 2 g in sodium chloride  0.9 % 100 mL IVPB    Antibiotic Indication::   Other Indication (list below)    Other Indication::   Unknown Source.   DISCONTD: vancomycin  (VANCOCIN ) IVPB 1000 mg/200 mL premix    Indication::   Other Indication (list below)    Other Indication::   Unknown Source.   vancomycin  (VANCOCIN ) 2,500 mg in sodium chloride  0.9 % 500 mL IVPB    Indication::   Other Indication (list below)   iohexol  (OMNIPAQUE ) 300 MG/ML solution 100 mL   ceFEPIme  (MAXIPIME ) 2 g in sodium chloride  0.9 % 100 mL IVPB    Antibiotic Indication::   UTI   OR Linked Order Group    acetaminophen  (TYLENOL ) tablet 650 mg    acetaminophen  (TYLENOL ) suppository 650 mg   OR Linked Order Group    ondansetron  (ZOFRAN ) tablet 4 mg    ondansetron  (ZOFRAN ) injection 4 mg   DISCONTD: enoxaparin  (LOVENOX ) injection 40 mg   gabapentin   (NEURONTIN ) capsule 400 mg    TAKE 1 CAPSULE BY MOUTH IN THE MORNING AND 1 AT NOON AND 2 AT BEDTIME Strength: 400 mg     DISCONTD: oxyCODONE  (Oxy IR/ROXICODONE ) immediate release tablet 10 mg    Refill:  0   gabapentin  (NEURONTIN ) capsule 800 mg   albuterol  (PROVENTIL ) (2.5 MG/3ML) 0.083% nebulizer solution 3 mL   diltiazem  (CARDIZEM  CD) 24 hr capsule 360 mg   apixaban  (ELIQUIS ) tablet 5 mg   spironolactone  (ALDACTONE ) tablet 50 mg   Vitamin D  (Ergocalciferol ) (DRISDOL ) 1.25 MG (50000 UNIT) capsule 50,000 Units   pramipexole  (MIRAPEX ) tablet 1 mg   pantoprazole  (PROTONIX ) EC tablet 40 mg   metFORMIN  (GLUCOPHAGE -XR) 24 hr tablet 1,000 mg   isosorbide  mononitrate (IMDUR ) 24 hr tablet 60 mg   DISCONTD: insulin  lispro (HUMALOG ) KwikPen SOPN 60 Units   DISCONTD: Basaglar  KwikPen KwikPen 60 Units   levothyroxine  (SYNTHROID ) tablet 200 mcg   HYDROmorphone  (DILAUDID ) injection 1 mg   oxyCODONE  (Oxy IR/ROXICODONE ) immediate release tablet 10 mg    Refill:  0   insulin  aspart (novoLOG ) injection 60 Units   insulin  glargine (LANTUS ) injection 60 Units    I have reviewed the patients home medicines and have made adjustments as needed  Problem List / ED Course: Problem List Items Addressed This Visit       Cardiovascular and Mediastinum   Paroxysmal atrial fibrillation (HCC)   Relevant Medications   diltiazem  (CARDIZEM  CD) 24 hr capsule 360 mg   apixaban  (ELIQUIS ) tablet 5 mg   spironolactone  (ALDACTONE ) tablet 50 mg (Start on 09/24/2024 10:00 AM)   isosorbide  mononitrate (IMDUR ) 24 hr tablet 60 mg (Start on 09/24/2024 10:00 AM)   Other Relevant Orders   Amb referral to AFIB Clinic   Other Visit Diagnoses       Sepsis, due to unspecified organism, unspecified whether acute organ dysfunction present (HCC)    -  Primary     Urinary tract infection without hematuria, site unspecified       Relevant Medications   ceFEPIme  (MAXIPIME ) 2 g in sodium chloride  0.9 % 100 mL IVPB (Completed)    vancomycin  (VANCOCIN ) 2,500 mg in sodium chloride  0.9 % 500 mL IVPB (Completed)   ceFEPIme  (MAXIPIME ) 2 g in sodium chloride  0.9 % 100 mL IVPB  Final diagnoses:  None    ED Discharge Orders     None          Bari Charmaine FALCON, MD 09/23/24 2303  "

## 2024-09-24 ENCOUNTER — Inpatient Hospital Stay (HOSPITAL_COMMUNITY)

## 2024-09-24 DIAGNOSIS — N390 Urinary tract infection, site not specified: Secondary | ICD-10-CM | POA: Diagnosis not present

## 2024-09-24 DIAGNOSIS — R9431 Abnormal electrocardiogram [ECG] [EKG]: Secondary | ICD-10-CM

## 2024-09-24 DIAGNOSIS — A419 Sepsis, unspecified organism: Secondary | ICD-10-CM | POA: Diagnosis not present

## 2024-09-24 LAB — CBC WITH DIFFERENTIAL/PLATELET
Abs Immature Granulocytes: 0.04 K/uL (ref 0.00–0.07)
Basophils Absolute: 0 K/uL (ref 0.0–0.1)
Basophils Relative: 0 %
Eosinophils Absolute: 0.1 K/uL (ref 0.0–0.5)
Eosinophils Relative: 1 %
HCT: 44.1 % (ref 36.0–46.0)
Hemoglobin: 13.5 g/dL (ref 12.0–15.0)
Immature Granulocytes: 0 %
Lymphocytes Relative: 14 %
Lymphs Abs: 1.6 K/uL (ref 0.7–4.0)
MCH: 27.6 pg (ref 26.0–34.0)
MCHC: 30.6 g/dL (ref 30.0–36.0)
MCV: 90.2 fL (ref 80.0–100.0)
Monocytes Absolute: 0.7 K/uL (ref 0.1–1.0)
Monocytes Relative: 6 %
Neutro Abs: 8.9 K/uL — ABNORMAL HIGH (ref 1.7–7.7)
Neutrophils Relative %: 79 %
Platelets: 178 K/uL (ref 150–400)
RBC: 4.89 MIL/uL (ref 3.87–5.11)
RDW: 15.4 % (ref 11.5–15.5)
WBC: 11.4 K/uL — ABNORMAL HIGH (ref 4.0–10.5)
nRBC: 0 % (ref 0.0–0.2)

## 2024-09-24 LAB — ECHOCARDIOGRAM COMPLETE
AV Mean grad: 8 mmHg
AV Peak grad: 14.6 mmHg
Ao pk vel: 1.91 m/s
Area-P 1/2: 4.29 cm2
Height: 62 in
Weight: 4469.17 [oz_av]

## 2024-09-24 LAB — COMPREHENSIVE METABOLIC PANEL WITH GFR
ALT: 29 U/L (ref 0–44)
AST: 36 U/L (ref 15–41)
Albumin: 3.3 g/dL — ABNORMAL LOW (ref 3.5–5.0)
Alkaline Phosphatase: 98 U/L (ref 38–126)
Anion gap: 9 (ref 5–15)
BUN: 9 mg/dL (ref 8–23)
CO2: 28 mmol/L (ref 22–32)
Calcium: 9.2 mg/dL (ref 8.9–10.3)
Chloride: 101 mmol/L (ref 98–111)
Creatinine, Ser: 0.68 mg/dL (ref 0.44–1.00)
GFR, Estimated: >60 mL/min
Glucose, Bld: 197 mg/dL — ABNORMAL HIGH (ref 70–99)
Potassium: 4.1 mmol/L (ref 3.5–5.1)
Sodium: 138 mmol/L (ref 135–145)
Total Bilirubin: 0.5 mg/dL (ref 0.0–1.2)
Total Protein: 6.5 g/dL (ref 6.5–8.1)

## 2024-09-24 LAB — CBG MONITORING, ED
Glucose-Capillary: 163 mg/dL — ABNORMAL HIGH (ref 70–99)
Glucose-Capillary: 171 mg/dL — ABNORMAL HIGH (ref 70–99)

## 2024-09-24 LAB — GLUCOSE, CAPILLARY
Glucose-Capillary: 225 mg/dL — ABNORMAL HIGH (ref 70–99)
Glucose-Capillary: 214 mg/dL — ABNORMAL HIGH (ref 70–99)
Glucose-Capillary: 259 mg/dL — ABNORMAL HIGH (ref 70–99)

## 2024-09-24 LAB — HIV ANTIBODY (ROUTINE TESTING W REFLEX): HIV Screen 4th Generation wRfx: NONREACTIVE

## 2024-09-24 MED ORDER — HYDROMORPHONE HCL 1 MG/ML IJ SOLN
1.0000 mg | Freq: Once | INTRAMUSCULAR | Status: AC
  Administered 2024-09-24: 1 mg via INTRAVENOUS
  Filled 2024-09-24: qty 1

## 2024-09-24 MED ORDER — PERFLUTREN LIPID MICROSPHERE
1.0000 mL | INTRAVENOUS | Status: AC | PRN
  Administered 2024-09-24: 4 mL via INTRAVENOUS

## 2024-09-24 MED ORDER — GERHARDT'S BUTT CREAM
TOPICAL_CREAM | Freq: Two times a day (BID) | CUTANEOUS | Status: DC
  Filled 2024-09-24: qty 60

## 2024-09-24 MED ORDER — INSULIN ASPART 100 UNIT/ML IJ SOLN
15.0000 [IU] | Freq: Once | INTRAMUSCULAR | Status: AC
  Administered 2024-09-24: 15 [IU] via SUBCUTANEOUS
  Filled 2024-09-24: qty 15

## 2024-09-24 MED ORDER — INSULIN ASPART 100 UNIT/ML IJ SOLN
0.0000 [IU] | Freq: Three times a day (TID) | INTRAMUSCULAR | Status: DC
  Administered 2024-09-25 (×3): 3 [IU] via SUBCUTANEOUS
  Filled 2024-09-24 (×3): qty 3

## 2024-09-24 MED ORDER — CHLORHEXIDINE GLUCONATE CLOTH 2 % EX PADS
6.0000 | MEDICATED_PAD | Freq: Every day | CUTANEOUS | Status: DC
  Administered 2024-09-24 – 2024-09-25 (×2): 6 via TOPICAL

## 2024-09-24 NOTE — Consult Note (Addendum)
 WOC Nurse Consult Note:  Consult requested for multiple wounds.  Performed remotely after review of progress notes and photos in the EMR.  Pt is familiar to [facility] team from a recent admission on 12/31 and chronic wounds are basically unchanged.  Pt has been followed by the outpatient wound care center and Dr Harden of the ortho service in the past; Pt was recommended for L foot amputation by Dr Harden 06/2022 which patient refused for chronic left foot plantar foot wound; also has a history of lymphedema for which she has tried intermittent compression per patient   Full thickness x 3 separate areas; left breast, umbilicus, left plantar foot, mons/pannus and upper labia related to chronic moisture and friction  Full thickness fissures to breast and pannus folds related to intertrigo and moisture and friction  Moisture Associated Skin Damage to sacrum/buttocks/perineum with patchy areas of red moist partial and full thickness skin loss.       Topical treatment orders provided for bedside nurses to perform as follows:     Clean L foot ulcer and L leg with Vashe wound cleanser Soila [phone]), apply silver  hydrofiber to wound bed of L plantar foot and cover with dry gauze.  Cover weeping areas to L leg (anterior and posterior) with silver  hydrofiber Soila [phone]) cover with ABD pads and wrap with Kerlix roll gauze beginning right above toes and ending right below knee.  Secure with Ace bandage wrapped in same fashion.  Clean underneath ulcers under L pannus and mons with Vashe wound cleanser, apply silver  hydrofiber Soila [phone]) cut to fit wound beds daily, may attempt to cover mons silver  with silicone foam to secure.  Cleanse underneath R breast and entire pannus with Vashe wound cleanser. Apply Interdry Soila # [phone])     4. Will be very difficult to get any wound care to stick to labia.  Would clean labia, perineum, buttocks and sacrum with Vashe then apply a thin layer of Gerhardt's Butt  Cream 2 times a day and prn soiling.     Patient should continue to follow at [facility] for these chronic wounds after discharge.    Please re-consult if further assistance is needed.  Thank-you,  Stephane Fought MSN, RN, CWOCN, CWCN-AP, CNS Contact Mon-Fri 0700-1500: [phone]

## 2024-09-24 NOTE — ED Notes (Signed)
 Pt. CBG 163, RN made aware.

## 2024-09-24 NOTE — Progress Notes (Signed)
 " [facility] Progress Note  Patient: [name]     FMW:[id number]  DOA: 09/23/2024   PCP: [name], DO       Brief hospital course: This is a 64 year old female with aortic stenosis, asthma, chronic diastolic heart failure, 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 In the ED: Tachycardic with heart rate about 112 Urine showed moderate nitrites, moderate leukocyte esterase, 21-50 RBCs, 21-50 WBCs, rare bacteria and budding yeast. WBC count was 23.9. Lactic acid was 2.4 and 2.8 She was treated with 2 L of LR boluses followed by 150 cc/h, cefepime , vancomycin , fentanyl  and ondansetron .  Subjective:  Patient has no complaints.  Is noted that her left leg and foot are erythematous but she states that this is chronic and are actually looking better than usual.  Assessment and Plan: Principal Problem:   Sepsis  - Urine culture growing 10,000 colonies of Klebsiella pneumonia - WBC count improved from 23.9-11.4 - Blood cultures negative - I am not sure if she actually has a UTI but she likely has infection of one or more of her many wounds.  Continue cefepime  for now.  Active Problems:   Hypothyroidism - Continue Synthroid     Type 2 diabetes mellitus with hyperglycemia, with long-term current use of insulin  - Continue Lantus  and NovoLog     Class 3 obesity (HCC) Body mass index is 51.09 kg/m.    Paroxysmal atrial fibrillation - Continue Cardizem  and Eliquis      Chronic heart failure with preserved ejection fraction (HCC) - Continue spironolactone  - Compensated  Multiple wounds - Patient has chronic wounds and at one point was recommended to have a left foot amputation which the patient  refused - Wounds are present on left breast, umbilicus, left plantar aspect of foot, mons pubis, upper labia, pannus and also has fissures on her breast and in the pannus folds with intertrigo    OSA (obstructive sleep apnea) Right AKA Coronary artery disease with stent to the RCA - Multiple connective tissue disorders      Code Status: Full Code Total time on patient care: 35 mion DVT prophylaxis:   apixaban  (ELIQUIS ) tablet 5 mg     Objective:   Vitals:   09/24/24 1123 09/24/24 1200 09/24/24 1301 09/24/24 1303  BP: 125/70  116/75   Pulse: 85  82   Resp: 16  (!) 21   Temp: 98.1 F (36.7 C)   98.6 F (37 C)  TempSrc: Oral   Oral  SpO2: 94%  92%   Weight:  126.7 kg    Height:  5' 2 (1.575 m)     Filed Weights   09/23/24 2029 09/24/24 1200  Weight: 135 kg 126.7 kg   Exam: General exam: Appears comfortable-has a foul odor HEENT: oral mucosa moist Respiratory system: Clear to auscultation.  Cardiovascular system: S1 & S2 heard  Gastrointestinal system: Abdomen soft, non-tender, nondistended. Normal bowel sounds   Extremities: No cyanosis, clubbing-left leg and foot are severely erythematous and discolored- Psychiatry:  Mood & affect appropriate.      CBC: Recent Labs  Lab 09/23/24 0424 09/24/24 0452  WBC 23.9* 11.4*  NEUTROABS 22.3* 8.9*  HGB 15.1* 13.5  HCT 47.0* 44.1  MCV 88.2 90.2  PLT 223 178   Basic Metabolic Panel: Recent Labs  Lab 09/23/24 0424 09/24/24 0452  NA 133* 138  K 4.5 4.1  CL 94* 101  CO2 25 28  GLUCOSE 262* 197*  BUN 11 9  CREATININE 0.93 0.68  CALCIUM  9.7 9.2     Scheduled Meds:  apixaban   5 mg Oral BID   Chlorhexidine  Gluconate Cloth  6 each Topical Daily   diltiazem   360 mg Oral Daily   gabapentin   400 mg Oral BID WC   gabapentin   800 mg Oral QHS   Gerhardt's butt cream   Topical BID   insulin  glargine  60 Units Subcutaneous QHS   isosorbide  mononitrate  60 mg Oral Daily   levothyroxine   200 mcg Oral QAC breakfast    [START ON 09/25/2024] metFORMIN   1,000 mg Oral BID WC   pantoprazole   40 mg Oral Daily   pramipexole   1 mg Oral TID   spironolactone   50 mg Oral Daily   [START ON 09/26/2024] Vitamin D  (Ergocalciferol )  50,000 Units Oral Weekly    Imaging and lab data personally reviewed   Author: Sharada Albornoz  09/24/2024 5:21 PM  To contact [facility]>   Check the care team in [facility] and look for the attending/consulting [facility] provider listed  Log into [email or web address] and use [facility] universal password   Go to> [facility]  and find provider  If you still have difficulty reaching the provider, please page the [facility] (Director on Call) for the Hospitalists listed on amion     "

## 2024-09-25 ENCOUNTER — Encounter (HOSPITAL_COMMUNITY): Payer: Self-pay | Admitting: Internal Medicine

## 2024-09-25 DIAGNOSIS — A419 Sepsis, unspecified organism: Secondary | ICD-10-CM | POA: Diagnosis not present

## 2024-09-25 LAB — GLUCOSE, CAPILLARY
Glucose-Capillary: 174 mg/dL — ABNORMAL HIGH (ref 70–99)
Glucose-Capillary: 159 mg/dL — ABNORMAL HIGH (ref 70–99)
Glucose-Capillary: 176 mg/dL — ABNORMAL HIGH (ref 70–99)

## 2024-09-25 LAB — CBC
HCT: 42.9 % (ref 36.0–46.0)
Hemoglobin: 13.4 g/dL (ref 12.0–15.0)
MCH: 27.7 pg (ref 26.0–34.0)
MCHC: 31.2 g/dL (ref 30.0–36.0)
MCV: 88.8 fL (ref 80.0–100.0)
Platelets: 192 K/uL (ref 150–400)
RBC: 4.83 MIL/uL (ref 3.87–5.11)
RDW: 15.3 % (ref 11.5–15.5)
WBC: 10.5 K/uL (ref 4.0–10.5)
nRBC: 0 % (ref 0.0–0.2)

## 2024-09-25 LAB — URINE CULTURE: Culture: 10000 — AB

## 2024-09-25 MED ORDER — OXYCODONE HCL 5 MG PO TABS
20.0000 mg | ORAL_TABLET | Freq: Once | ORAL | Status: AC
  Administered 2024-09-25: 20 mg via ORAL
  Filled 2024-09-25: qty 4

## 2024-09-25 MED ORDER — OXYCODONE HCL 5 MG PO TABS
20.0000 mg | ORAL_TABLET | Freq: Three times a day (TID) | ORAL | Status: DC
  Administered 2024-09-25: 20 mg via ORAL
  Filled 2024-09-25: qty 4

## 2024-09-25 MED ORDER — OXYCODONE HCL ER 20 MG PO T12A: 20.0000 mg | EXTENDED_RELEASE_TABLET | Freq: Two times a day (BID) | ORAL | Status: DC

## 2024-09-25 MED ORDER — SULFAMETHOXAZOLE-TRIMETHOPRIM 800-160 MG PO TABS: 1.0000 | ORAL_TABLET | Freq: Two times a day (BID) | ORAL | 0 refills | Status: AC

## 2024-09-25 NOTE — Discharge Summary (Signed)
 Physician Discharge Summary  [name] FMW:[id number] DOB: [birth year] DOA: 09/23/2024  PCP: [name], DO  Admit date: 09/23/2024 Discharge date: 09/25/2024 Discharging to: home Recommendations for Outpatient Follow-up:  She needs close outpatient wound care    Discharge Diagnoses:   Principal Problem:   Sepsis (HCC) Active Problems:   Hypothyroidism   Type 2 diabetes mellitus with hyperglycemia, with long-term current use of insulin  (HCC)   Essential hypertension   CAD S/P percutaneous coronary angioplasty   Class 3 obesity (HCC)   Hyperlipidemia   Gastroesophageal reflux disease   Paroxysmal atrial fibrillation (HCC)   Chronic heart failure with preserved ejection fraction (HCC)   OSA (obstructive sleep apnea)     Brief hospital course: This is a 64 year old female with aortic stenosis, asthma, chronic diastolic heart failure, 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 In the ED: Tachycardic with heart rate about 112 Urine showed moderate nitrites, moderate leukocyte esterase, 21-50 RBCs, 21-50 WBCs, rare bacteria and budding yeast. WBC count was 23.9. Lactic acid was 2.4 and 2.8 She was treated with 2 L of LR boluses followed by 150 cc/h, cefepime , vancomycin , fentanyl  and ondansetron .   Subjective:  Patient has no complaints.  Is noted that her left leg and foot are erythematous but she states that this is chronic and are actually looking better than usual.   Assessment and Plan: Principal Problem:   Sepsis  - likely related to infection in her many wounds - started on Cefepime  - Urine culture growing 10,000 colonies of Klebsiella pneumonia - WBC count improved from 23.9>11.4> 10.5 - Blood  cultures negative  - change Cefepime  to Bactrim  to cover skin/soft tissue organisms    Active Problems:   Hypothyroidism - Continue Synthroid      Type 2 diabetes mellitus with hyperglycemia, with long-term current use of insulin  - Continue home meds     Class 3 obesity (HCC) Body mass index is 51.09 kg/[name]. - on Mounjaro      Paroxysmal atrial fibrillation - Continue Cardizem  and Eliquis       Chronic heart failure with preserved ejection fraction (HCC) - Continue spironolactone  - Compensated   Multiple wounds - Patient has chronic wounds and at one point was recommended to have a left foot amputation which the patient refused - Wounds are present on left breast, umbilicus, left plantar aspect of foot, mons pubis, upper labia, pannus and also has fissures on her breast and in the pannus folds with intertrigo  - she has been discharged from the wound care clinic because she had to cancel some appointments due to not feeling well and currently only her husband does wound care for her - she has been evaluated by our wound care RN And multiple recommendations have been made   OSA (obstructive sleep apnea)  Right AKA with bed bound state currently - she state she is working on regain her strength to become mobile again  Coronary artery disease with stent to the RCA - Multiple connective tissue disorders        Wound 09/24/24 1522 Pressure Injury Sacrum Mid Unstageable - Full thickness tissue loss in which the base of the injury is covered by slough (yellow, tan, gray, green or brown) and/or eschar (tan, brown or black) in the wound bed. (Active)   Discharge  Instructions  Discharge Instructions     Amb referral to [facility]   Complete by: As directed       Allergies as of 09/25/2024       Reactions   Fish Allergy  Anaphylaxis, Other (See Comments)   INCLUDES OMEGA-3 OIL   Fish Oil Anaphylaxis, Swelling, Other (See Comments)   THROAT SWELLS INCLUDES FISH AS A CLASS    Gabapentin  Anxiety, Other (See Comments)   Anxiety on high doses, Angioedema   Ipratropium-albuterol  Shortness Of Breath, Other (See Comments)    Bronchospasm, Wheezing becomes worse   Omega-3 Fatty Acids Anaphylaxis, Swelling, Other (See Comments)   Throat swelling, Has tolerated Glucerna nutritional drinks   Peanut -containing Drug Products Shortness Of Breath   Victoza  [liraglutide ] Nausea And Vomiting   Patient reported severe, debilitating nausea   Lovastatin Other (See Comments)   EXTREME JOINT PAIN   Metronidazole  Nausea And Vomiting, Swelling   Headache and shakes, Pt tolerating IV 06/14/22   Red Yeast Rice [cholestin] Other (See Comments)   Muscle pain and severe joint pain.    Rosuvastatin  Other (See Comments)   Extreme joint pain, to this & other statins   Rotigotine  Swelling   (Neupro ) Extreme edema   Atrovent Hfa [ipratropium Bromide Hfa] Other (See Comments)   wheezing   Iodine  Swelling   Pt specifies allergen was Povidone-iodine  10 % swab. The reaction was localized to region (BLE) where topical iodine  was being applied.    Ipratropium Bromide Other (See Comments)   Wheezing becomes worse   Morphine     Other Other (See Comments)   Surgical Staples causes redness/infection per patient.   Pepto-bismol [bismuth Subsalicylate] Nausea And Vomiting, Other (See Comments)   Extreme vomiting   Wound Dressing Adhesive Other (See Comments)   Electrodes causes skin breakdown, Electrodes causes skin breakdown   Enalapril  Cough   Metoprolol  Palpitations   Suprep [na Sulfate-k Sulfate-mg Sulf] Nausea And Vomiting   Tape Other (See Comments)   Electrodes causes skin breakdown        Medication List     TAKE these medications    pramipexole  1 MG tablet Commonly known as: MIRAPEX  Take 1 tablet (1 mg total) by mouth 3 (three) times daily. The timing of this medication is very important.   Afrin 12 Hour 0.05 % nasal spray Generic drug: oxymetazoline  Place 1 spray into  both nostrils 2 (two) times daily as needed for congestion.   albuterol  (2.5 MG/3ML) 0.083% nebulizer solution Commonly known as: PROVENTIL  USE 1 VIAL IN NEBULIZER EVERY 4 HOURS AS NEEDED FOR WHEEZING FOR SHORTNESS OF BREATH What changed: See the new instructions.   albuterol  108 (90 Base) MCG/ACT inhaler Commonly known as: VENTOLIN  HFA Inhale 2 puffs into the lungs every 4 (four) hours as needed for wheezing or shortness of breath. What changed: Another medication with the same name was changed. Make sure you understand how and when to take each.   Aleve 220 MG tablet Generic drug: naproxen sodium Take 220 mg by mouth 2 (two) times daily as needed (for pain).   augmented betamethasone  dipropionate 0.05 % cream Commonly known as: DIPROLENE -AF Apply twice daily as needed. What changed:  how much to take how to take this when to take this reasons to take this additional instructions   Banophen  25 MG tablet Generic drug: diphenhydrAMINE  Take 25 mg by mouth 2 (two) times daily as needed for itching.   Basaglar  KwikPen 100 UNIT/ML Inject 60 Units into the skin at bedtime.  dapagliflozin  propanediol 10 MG Tabs tablet Commonly known as: FARXIGA  Take 1 tablet (10 mg total) by mouth daily before breakfast.   diclofenac  1.3 % Ptch Commonly known as: FLECTOR  Place 1 patch onto the skin 2 (two) times daily.   diclofenac  Sodium 2 % Soln Commonly known as: PENNSAID  Apply 2 Pump (40 mg total) topically 2 (two) times daily as needed. 1 application to back twice daily What changed:  reasons to take this additional instructions   diltiazem  360 MG 24 hr capsule Commonly known as: CARDIZEM  CD Take 1 capsule (360 mg total) by mouth daily. What changed: when to take this   Eliquis  5 MG Tabs tablet Generic drug: apixaban  Take 1 tablet by mouth twice daily What changed: when to take this   EPINEPHrine  0.3 mg/0.3 mL Soaj injection Commonly known as: EPI-PEN Inject 0.3 mg into the  muscle as needed for anaphylaxis.   ferrous sulfate  325 (65 FE) MG tablet Commonly known as: FeroSul Take 1 tablet (325 mg total) by mouth daily with breakfast.   fluconazole  150 MG tablet Commonly known as: DIFLUCAN  TAKE 1 TABLET BY MOUTH NOW, REPEAT  IN  72  HOURS  IF  NO  IMPROVEMENT What changed: See the new instructions.   fluticasone  50 MCG/ACT nasal spray Commonly known as: FLONASE  Place 2 sprays into both nostrils daily as needed for allergies or rhinitis. What changed: when to take this   fluticasone -salmeterol 100-50 MCG/ACT Aepb Commonly known as: ADVAIR Inhale 1 puff into the lungs 2 (two) times daily.   FreeStyle Libre 3 Plus Sensor Misc Change sensor every 15 days.   furosemide  40 MG tablet Commonly known as: LASIX  Take 1 tablet (40 mg total) by mouth daily.   gabapentin  400 MG capsule Commonly known as: NEURONTIN  Take 1 capsule (400 mg total) by mouth 4 (four) times daily. TAKE 1 CAPSULE BY MOUTH IN THE MORNING AND 1 AT NOON AND 2 AT BEDTIME Strength: 400 mg What changed:  how much to take when to take this additional instructions   gentamicin ointment 0.1 % Commonly known as: GARAMYCIN Apply 1 Application topically 2 (two) times daily as needed (for wound care).   Glucagon  Emergency 1 MG Kit Inject 1 mg into the vein daily as needed.   HumaLOG  KwikPen 200 UNIT/ML KwikPen Generic drug: insulin  lispro Inject 60 Units into the skin 3 (three) times daily with meals. What changed: how much to take   INSULIN  SYRINGE .5CC/30GX5/16 30G X 5/16 0.5 ML Misc Take as directed with Novolog  inuslin.  15-20 units per day   Insulin  Syringes (Disposable) U-100 0.5 ML Misc 100 each by Does not apply route 4 (four) times daily -  before meals and at bedtime.   isosorbide  mononitrate 60 MG 24 hr tablet Commonly known as: IMDUR  Take 1 tablet (60 mg total) by mouth daily. What changed: when to take this   levocetirizine 5 MG tablet Commonly known as: XYZAL  Take  1 tablet (5 mg total) by mouth every evening.   levothyroxine  200 MCG tablet Commonly known as: SYNTHROID  Take 1 tablet (200 mcg total) by mouth daily before breakfast.   lidocaine  5 % ointment Commonly known as: XYLOCAINE  Apply 1 Application topically 2 (two) times daily as needed for mild pain (pain score 1-3).   lidocaine  5 % Commonly known as: LIDODERM  Place 1 patch onto the skin every 12 (twelve) hours. Remove & Discard patch within 12 hours or as directed   metFORMIN  500 MG 24 hr tablet Commonly  known as: GLUCOPHAGE -XR Take 2 tablets (1,000 mg total) by mouth 2 (two) times daily.   mupirocin  ointment 2 % Commonly known as: BACTROBAN  APPLY  OINTMENT TOPICALLY TWICE DAILY What changed: See the new instructions.   naloxone  4 MG/0.1ML Liqd nasal spray kit Commonly known as: NARCAN  Place 1 spray into the nose as needed (opoid overdose).   nitroGLYCERIN  0.4 MG SL tablet Commonly known as: NITROSTAT  Place 1 tablet (0.4 mg total) under the tongue every 5 (five) minutes as needed for chest pain.   nystatin  powder Commonly known as: MYCOSTATIN /NYSTOP  Apply 1 Application topically as needed (as directed for yeast infections).   omeprazole  40 MG capsule Commonly known as: PRILOSEC Take 1 capsule (40 mg total) by mouth daily. What changed: when to take this   OneTouch Delica Lancets 33G Misc Use to check blood glucose up to 4 times a day   OneTouch Verio test strip Generic drug: glucose blood USE AS DIRECTED   Oxycodone  HCl 20 MG Tabs Take 1 tablet (20 mg total) by mouth in the morning, at noon, and at bedtime. What changed:  when to take this additional instructions   promethazine  25 MG tablet Commonly known as: PHENERGAN  Take 1 tablet (25 mg total) by mouth as needed for vomiting or nausea. What changed: when to take this   spironolactone  50 MG tablet Commonly known as: ALDACTONE  Take 1 tablet by mouth once daily What changed: when to take this    sulfamethoxazole -trimethoprim  800-160 MG tablet Commonly known as: Bactrim  DS Take 1 tablet by mouth 2 (two) times daily for 7 days.   Systane Preservative Free 0.4-0.3 % Soln Generic drug: Polyethyl Glyc-Propyl Glyc PF Place 2 drops into both eyes in the morning and at bedtime.   Mounjaro  5 MG/0.5ML Pen Generic drug: tirzepatide  Inject 5 mg into the skin every Sunday.   tirzepatide 7.5 MG/0.5ML Pen Commonly known as: MOUNJARO Inject 7.5 mg into the skin once a week.   Vitamin D (Ergocalciferol) 1.25 MG (50000 UNIT) Caps capsule Commonly known as: DRISDOL Take 1 capsule by mouth once a week What changed: when to take this   WOMENS 50+ MULTI VITAMIN PO Take 1 tablet by mouth in the morning and at bedtime.        Follow-up Information     Wendling, Nicholas Paul, DO. Call in 1 week(s).   Specialty: Family Medicine Contact information: [address] [place] [address] [phone]                    The results of significant diagnostics from this hospitalization (including imaging, microbiology, ancillary and laboratory) are listed below for reference.    ECHOCARDIOGRAM COMPLETE Result Date: 09/24/2024    ECHOCARDIOGRAM REPORT   Patient Name:   [name] Console Date of Exam: 09/24/2024 Medical Rec #:  [id number]      Height:       62.0 in Accession #:    [id number]     Weight:       279.3 lb Date of Birth:  12/11/1960     BSA:          2.203 [name] Patient Age:    63 years       BP:           116/75 mmHg Patient Gender: F              HR:           79  bpm. Exam  Location:  Inpatient Procedure: 2D Echo, Cardiac Doppler, Color Doppler and Intracardiac            Opacification Agent (Both Spectral and Color Flow Doppler were            utilized during procedure). Indications:    Abnormal ECG  History:        Patient has prior history of Echocardiogram examinations, most                 recent 09/12/2023. CHF, CAD; Risk Factors:Hypertension, Diabetes,                  Dyslipidemia and Sleep Apnea.  Sonographer:    Philomena Daring Referring Phys: [id number] DAVID MANUEL ORTIZ  Sonographer Comments: Technically difficult study due to poor echo windows and patient is obese. Image acquisition challenging due to patient body habitus. IMPRESSIONS  1. Extremely limited study.  2. Left ventricular ejection fraction, by estimation, is 60 to 65%. The left ventricle has normal function. The left ventricle has no regional wall motion abnormalities. Left ventricular diastolic parameters were normal.  3. Right ventricular systolic function is normal. The right ventricular size is normal.  4. The mitral valve is normal in structure. No evidence of mitral valve regurgitation. No evidence of mitral stenosis.  5. The aortic valve was not well visualized. Aortic valve regurgitation is not visualized. No aortic stenosis is present.  6. Pulmonic valve regurgitation Not well visualized.  7. The inferior vena cava is dilated in size with <50% respiratory variability, suggesting right atrial pressure of 15 mmHg. FINDINGS  Left Ventricle: Left ventricular ejection fraction, by estimation, is 60 to 65%. The left ventricle has normal function. The left ventricle has no regional wall motion abnormalities. Definity  contrast agent was given IV to delineate the left ventricular  endocardial borders. The left ventricular internal cavity size was normal in size. There is no left ventricular hypertrophy. Left ventricular diastolic parameters were normal. Right Ventricle: The right ventricular size is normal. Right ventricular systolic function is normal. Left Atrium: Left atrial size was normal in size. Right Atrium: Right atrial size was normal in size. Pericardium: There is no evidence of pericardial effusion. Mitral Valve: The mitral valve is normal in structure. No evidence of mitral valve regurgitation. No evidence of mitral valve stenosis. Tricuspid Valve: The tricuspid valve is normal in structure. Tricuspid  valve regurgitation is not demonstrated. No evidence of tricuspid stenosis. Aortic Valve: The aortic valve was not well visualized. Aortic valve regurgitation is not visualized. No aortic stenosis is present. Aortic valve mean gradient measures 8.0 mmHg. Aortic valve peak gradient measures 14.6 mmHg. Pulmonic Valve: The pulmonic valve was not well visualized. Pulmonic valve regurgitation Not well visualized. Aorta: The aortic root is normal in size and structure. Venous: The inferior vena cava is dilated in size with less than 50% respiratory variability, suggesting right atrial pressure of 15 mmHg. IAS/Shunts: The interatrial septum was not well visualized. Additional Comments: Extremely limited study.   Diastology LV e' medial:    7.72 cm/s LV E/e' medial:  14.0 LV e' lateral:   8.38 cm/s LV E/e' lateral: 12.9  RIGHT VENTRICLE             IVC RV S prime:     10.40 cm/s  IVC diam: 2.40 cm TAPSE ([name]-mode): 1.8 cm LEFT ATRIUM             Index        RIGHT ATRIUM  Index LA Vol (A2C):   64.9 ml 29.46 ml/[name]  RA Area:     13.60 cm LA Vol (A4C):   63.1 ml 28.64 ml/[name]  RA Volume:   28.20 ml  12.80 ml/[name] LA Biplane Vol: 65.4 ml 29.68 ml/[name]  AORTIC VALVE AV Vmax:           191.00 cm/s AV Vmean:          132.000 cm/s AV VTI:            0.363 [name] AV Peak Grad:      14.6 mmHg AV Mean Grad:      8.0 mmHg LVOT Vmax:         112.00 cm/s LVOT Vmean:        73.500 cm/s LVOT VTI:          0.219 [name] LVOT/AV VTI ratio: 0.60  AORTA Ao Asc diam: 3.20 cm MITRAL VALVE                TRICUSPID VALVE MV Area (PHT): 4.29 cm     TR Peak grad:   4.8 mmHg MV Decel Time: 177 msec     TR Vmax:        109.00 cm/s MV E velocity: 108.00 cm/s MV A velocity: 95.90 cm/s   SHUNTS MV E/A ratio:  1.13         Systemic VTI: 0.22 [name] Redell Shallow MD Electronically signed by Redell Shallow MD Signature Date/Time: 09/24/2024/2:12:57 PM    Final    CT ABDOMEN PELVIS W CONTRAST Result Date: 09/23/2024 EXAM: CT ABDOMEN AND PELVIS WITH CONTRAST 09/23/2024  07:43:07 AM TECHNIQUE: CT of the abdomen and pelvis was performed with the administration of intravenous contrast. Multiplanar reformatted images are provided for review. Automated exposure control, iterative reconstruction, and/or weight-based adjustment of the mA/kV was utilized to reduce the radiation dose to as low as reasonably achievable. COMPARISON: 09/10/2023 CLINICAL HISTORY: Sepsis. FINDINGS: LOWER CHEST: Bibasilar scarring noted within the imaged portions of the lung bases. LIVER: The contour of the liver appears somewhat nodular with hypertrophy of the caudate lobe and lateral segment of the left hepatic lobe. No focal liver lesion. GALLBLADDER AND BILE DUCTS: Status post cholecystectomy. No biliary ductal dilatation. SPLEEN: The spleen is within normal limits in size and appearance. PANCREAS: Pancreas is normal in size and contour without focal lesion or ductal dilatation. ADRENAL GLANDS: Normal size and morphology bilaterally. No nodule, thickening, or hemorrhage. No periadrenal stranding. KIDNEYS, URETERS AND BLADDER: Bilateral, too small to characterize low attenuation kidney lesions are noted. The largest is in the upper pole of the left kidney measuring 8 mm. These are compatible with Bosniak class 2 cysts. Per consensus, no follow-up is needed for simple Bosniak type 1 and 2 renal cysts, unless the patient has a malignancy history or risk factors. No stones in the kidneys or ureters. No hydronephrosis. No perinephric or periureteral stranding. The urinary bladder is decompressed, containing a Foley catheter as well as a small amount of gas. GI AND BOWEL: The stomach is normal. No pathologic dilatation of the bowel loops. Status post appendectomy. Similar to the previous exam, there is rectus diastasis with ventral and right paramidline herniation of nonobstructive loops of large and small bowel. This extends into the patient's pannus. PERITONEUM AND RETROPERITONEUM: No free fluid or fluid  collections. No signs of pneumoperitoneum. VASCULATURE: Aorta is normal in caliber. Aortic atherosclerotic calcification. LYMPH NODES: No enlarged lymph nodes within the abdomen or pelvis. REPRODUCTIVE ORGANS: Status  post hysterectomy. No adnexal mass. BONES AND SOFT TISSUES: Thoracolumbar degenerative disc disease. Curvature of the lumbar spine as convex towards the left. No acute osseous abnormality. No focal soft tissue abnormality. IMPRESSION: 1. No CT evidence of an acute abdominal or pelvic process or abscess to account for sepsis. 2. Morphologic features of the liver concerning for cirrhosis. 3. Aortic atherosclerotic calcification. Electronically signed by: Waddell Calk MD MD 09/23/2024 08:00 AM EST RP Workstation: [id number]   DG Chest Port 1 View Result Date: 09/23/2024 EXAM: 1 VIEW XRAY OF THE CHEST 09/23/2024 04:45:00 AM COMPARISON: Radiograph of the chest dated 09/17/2023. CLINICAL HISTORY: Questionable sepsis - evaluate for abnormality. FINDINGS: LUNGS AND PLEURA: No focal pulmonary opacity. No pleural effusion. No pneumothorax. HEART AND MEDIASTINUM: Cardiomegaly. BONES AND SOFT TISSUES: No acute osseous abnormality. IMPRESSION: 1. No acute findings. 2. Cardiomegaly. Electronically signed by: Evalene Coho MD MD 09/23/2024 05:00 AM EST RP Workstation: [id number]   Labs:   Basic Metabolic Panel: Recent Labs  Lab 09/23/24 0424 09/24/24 0452  NA 133* 138  K 4.5 4.1  CL 94* 101  CO2 25 28  GLUCOSE 262* 197*  BUN 11 9  CREATININE 0.93 0.68  CALCIUM  9.7 9.2     CBC: Recent Labs  Lab 09/23/24 0424 09/24/24 0452 09/25/24 0814  WBC 23.9* 11.4* 10.5  NEUTROABS 22.3* 8.9*  --   HGB 15.1* 13.5 13.4  HCT 47.0* 44.1 42.9  MCV 88.2 90.2 88.8  PLT 223 178 192         SIGNED:   True Atlas, MD  [facility] 09/25/2024, 2:07 PM Time taking on discharge: 50 minutes

## 2024-09-25 NOTE — Progress Notes (Signed)
 Pt is alert and fully oriented x 4, afebrile, stable hemodynamically, NSR on the monitor, on 2 LPM of NCL, normal respiratory effort, acute distress noted overnight. Pt is able to rest and sleep well with no major complaints. Multiple wounds care by wound care team yesterday. Frequently position changes encouraged. Plan of care is reviewed. We will continue to monitor.  Problem: Skin Integrity: multiple wounds. Goal: Risk for impaired skin integrity will decrease Outcome: Progressing   Problem: Clinical Measurements: Goal: Ability to maintain clinical measurements within normal limits will improve Outcome: Progressing Goal: Will remain free from infection Outcome: Progressing Goal: Diagnostic test results will improve Outcome: Progressing Goal: Respiratory complications will improve Outcome: Progressing Goal: Cardiovascular complication will be avoided Outcome: Progressing   Problem: Elimination: Goal: Will not experience complications related to bowel motility Outcome: Progressing Goal: Will not experience complications related to urinary retention Outcome: Progressing   Problem: Pain Managment: Goal: General experience of comfort will improve and/or be controlled Outcome: Progressing   Wendi Dash, RN

## 2024-09-25 NOTE — TOC Transition Note (Addendum)
 Transition of Care [facility]) - Discharge Note   Patient Details  Name: [name] MRN: [id number] Date of Birth: February 15, 1961  Transition of Care [facility]) CM/SW Contact:  Toy LITTIE Agar, RN Phone [phone]  09/25/2024, 3:25 PM   Clinical Narrative:    Patient discharging home. CM has not been able to secure [facility] agency. MD made aware. CM at bedside to discuss with patient and spouse. Patient and spouse both agreeable that they are comfortable with discharging without home health. Spouse states that he has been caring for patient and taking care of wounds for over a year and that wounds are healing slowly. CM inquires about wound care center follow up, per wife and spouse patient was dismissed from Wound Care center in [place] due to numerous cancellations. Patient states that she is willing to go to [facility]. Appt has been scheduled and added to AVS. No other needs noted. CM will sign off. Patient requesting transport. Transportation has been arranged via [facility].   [facility] has accepted patient for [facility] needs but is not able to provide [facility] aide. MD has been updated AVS updated.   Final next level of care: Home/Self Care Barriers to Discharge: No Barriers Identified   Patient Goals and CMS Choice Patient states their goals for this hospitalization and ongoing recovery are:: Ready to go home CMS Medicare.gov Compare Post Acute Care list provided to:: Patient Choice offered to / list presented to : Patient [place] ownership interest in [facility].provided to:: Patient    Discharge Placement                       Discharge Plan and Services Additional resources added to the After Visit Summary for                  DME Arranged: N/A DME Agency: NA       HH Arranged: NA (no accepting agency) HH Agency: NA        Social Drivers of Health (SDOH) Interventions SDOH Screenings   Food Insecurity: No Food Insecurity (09/24/2024)  Housing: Low Risk  (09/24/2024)  Transportation Needs: No Transportation Needs (09/24/2024)  Utilities: Not At Risk (09/24/2024)  Alcohol  Screen: Low Risk (09/10/2023)  Depression (PHQ2-9): Low Risk (07/29/2024)  Recent Concern: Depression (PHQ2-9) - High Risk (05/27/2024)  Financial Resource Strain: Low Risk (01/02/2024)  Physical Activity: Unknown (09/10/2023)  Social Connections: Socially Isolated (09/24/2024)  Stress: No Stress Concern Present (09/10/2023)  Tobacco Use: Medium Risk (09/25/2024)  Health Literacy: Adequate Health Literacy (01/02/2024)     Readmission Risk Interventions    09/27/2023    2:29 PM  Readmission Risk Prevention Plan  Transportation Screening Complete  HRI or Home Care Consult Complete  Social Work Consult for Recovery Care Planning/Counseling Complete  Palliative Care Screening Not Applicable  Medication Review Oceanographer) Complete

## 2024-09-25 NOTE — TOC Initial Note (Addendum)
 Transition of Care [facility]) - Initial/Assessment Note    Patient Details  Name: [name] MRN: [id number] Date of Birth: 08-20-1961  Transition of Care [facility]) CM/SW Contact:    Toy LITTIE Agar, RN Phone [phone]  09/25/2024, 12:43 PM  Clinical Narrative:                 Inpatient care manager received message about patient discharging home with need to continue home health. CM spoke with Artavia with [facility] patient was previously with [facility] but insurance changed and now patient is out of network so agency can not accept patient. CM has faxed out for Home health needs with no offers. MD has been updated.  Home Medical Care  Service Provider Request Status Services Address Phone Fax Patient Preferred  [facility])  Considering We can not provide aide, what are the needs for the RN -- [address], [place] [address] [phone] --  [facility])  Pending - Request Sent -- [address], [place] [address] [phone] --  [facility])  Declined Out of network -- [address], [place] [address] [phone] --  [facility])  Declined Cost of care -- [address], [place] [address] [phone] --  [facility])  Declined Insurance not in network -- [address] [place], [place] [address] [phone] --  [facility])  Declined Inadequate staffing -- [address], [place] [address] [phone] --  [facility])  Declined Insurance not in network -- -- -- -- --  Current Availability last updated by Rumalda Rosella on 06/11/2024 1816   We are fully staffed. We cover most of [place]  except for [place]. We also cover most of [place] . We  have offices in every state. We accept Medicare, Tricare, VA pay, [employer], [facility], and will run any insurance to try to accomodate.        [facility])  Declined I've had her in the past, cannot take again, sorry -- [address], [place] [address] [phone] --          Patient Goals and CMS Choice            Expected Discharge Plan and Services         Expected Discharge Date: 09/25/24                                    Prior Living Arrangements/Services                       Activities of Daily Living   ADL Screening (condition at time of admission) Independently performs ADLs?: No Does the patient have a NEW difficulty with bathing/dressing/toileting/self-feeding that is expected to last >3 days?: No (pt nonambulatory...baseline) Does the patient have a NEW difficulty with getting in/out of bed, walking, or climbing stairs that is expected to last >3 days?: No (pt nonambulatory....baseline) Does the patient have a NEW difficulty with communication that is expected to last >3 days?: No Is the patient deaf or have difficulty hearing?: No Does the patient have difficulty  seeing, even when wearing glasses/contacts?: No Does the patient have difficulty concentrating, remembering, or making decisions?: No  Permission Sought/Granted                  Emotional Assessment              Admission diagnosis:  Paroxysmal atrial fibrillation (HCC) [I48.0] Sepsis secondary to UTI (HCC) [A41.9, N39.0] Urinary tract infection without hematuria, site unspecified [N39.0] Sepsis, due to unspecified organism, unspecified whether acute organ dysfunction present [facility]) [A41.9] Patient Active Problem List   Diagnosis Date Noted   Sepsis secondary to UTI (HCC) 09/23/2024   OSA (obstructive sleep apnea) 02/15/2024   Hypokalemia 09/22/2023   Open wound of plantar aspect of left foot 09/20/2023    Open wound of genital labia 09/20/2023   Open abdominal wall wound, initial encounter 09/20/2023   Breast wound, left, initial encounter 09/20/2023   Cellulitis of right breast 09/19/2023   Chronic heart failure with preserved ejection fraction (HCC) 09/18/2023   Acute on chronic respiratory failure with hypoxia and hypercapnia (HCC) 09/18/2023   Cellulitis of left lower extremity 09/10/2023   RLS (restless legs syndrome) 01/08/2023   Osteomyelitis (HCC) 07/07/2022   Medication management 07/07/2022   Diabetic ulcer of left midfoot associated with diabetes mellitus due to underlying condition, with necrosis of muscle (HCC)    Diabetic foot infection (HCC) 06/14/2022   Sacral decubitus ulcer, stage II (HCC) 12/24/2021   Class 3 obesity (HCC) 12/23/2021   Iron  deficiency anemia 12/23/2021   Acute on chronic diastolic CHF (congestive heart failure) (HCC) 12/22/2021   Hyperkalemia 12/22/2021   Left leg cellulitis 12/20/2021   Peripheral vascular disease, unspecified 10/10/2021   Dehiscence of amputation stump (HCC)    Acute blood loss anemia 06/24/2021   Postoperative wound infection 06/24/2021   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)    Bacteremia due to Enterococcus 05/28/2021   Digestive disorder 05/18/2021   Bilateral lower extremity edema 05/02/2021   Statin myopathy 03/04/2021   Aortic stenosis 03/04/2021   Sjogren's syndrome 02/11/2020   Cutaneous abscess of right foot    Statin intolerance 08/16/2019   Paroxysmal atrial fibrillation (HCC) 01/06/2019   Gram-negative bacteremia 10/18/2018   Streptococcal bacteremia 10/18/2018   CAD S/P percutaneous coronary angioplasty 04/19/2018   Essential hypertension    Moderate persistent asthma 03/21/2018   NAFLD (nonalcoholic fatty liver disease)    Recurrent cellulitis of lower extremity 01/02/2018   Cellulitis of lower leg 01/02/2018   Chronic diarrhea 05/08/2017   H/O Clostridium difficile  infection 05/08/2017   Rectal bleeding 05/08/2017   Hyperbilirubinemia 04/29/2016   Hyponatremia 04/29/2016   Sepsis due to cellulitis (HCC) 04/28/2016   Cellulitis of right foot 03/09/2016   Allergic rhinitis due to pollen 02/15/2016   Anaphylactic reaction due to food 02/10/2016   Type 2 diabetes mellitus with hyperglycemia, with long-term current use of insulin  (HCC) 02/10/2016   Gastroesophageal reflux disease without esophagitis 02/10/2016   Atopic eczema 02/10/2016   Cervical nerve root disorder 12/15/2015   Lumbar radiculopathy 12/15/2015   Daytime somnolence 11/26/2015   Venous stasis dermatitis of both lower extremities 02/11/2015   Morbid obesity (HCC) 06/19/2014   Abnormal LFTs 03/26/2014   B-complex deficiency 03/26/2014   Benign essential HTN 03/26/2014   Chronic pain associated with significant psychosocial dysfunction 03/26/2014   Diaphragmatic hernia 03/26/2014   Gastroesophageal reflux disease 03/26/2014   Hammer toe 03/26/2014   H/O neoplasm 03/26/2014   Adaptive colitis 03/26/2014  Deafness, sensorineural 03/26/2014   Fibromyalgia 01/20/2014   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/20/2014   Hyperlipidemia 11/11/2012   Mixed connective tissue disease 11/11/2012   Hypothyroidism 11/11/2012   Asthma 11/11/2012   Connective tissue disease overlap syndrome 02/20/2012   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 [address], [place] - [address] [place] [address] Phone: [phone] Fax: [phone]     Social Drivers of Health (SDOH) Social History: SDOH Screenings   Food Insecurity: No Food Insecurity (09/24/2024)  Housing: Low Risk (09/24/2024)  Transportation Needs: No Transportation Needs (09/24/2024)  Utilities: Not At Risk (09/24/2024)  Alcohol  Screen: Low Risk (09/10/2023)   Depression (PHQ2-9): Low Risk (07/29/2024)  Recent Concern: Depression (PHQ2-9) - High Risk (05/27/2024)  Financial Resource Strain: Low Risk (01/02/2024)  Physical Activity: Unknown (09/10/2023)  Social Connections: Socially Isolated (09/24/2024)  Stress: No Stress Concern Present (09/10/2023)  Tobacco Use: Medium Risk (09/25/2024)  Health Literacy: Adequate Health Literacy (01/02/2024)   SDOH Interventions:     Readmission Risk Interventions    09/27/2023    2:29 PM  Readmission Risk Prevention Plan  Transportation Screening Complete  HRI or Home Care Consult Complete  Social Work Consult for Recovery Care Planning/Counseling Complete  Palliative Care Screening Not Applicable  Medication Review Oceanographer) Complete

## 2024-09-26 ENCOUNTER — Telehealth: Payer: Self-pay

## 2024-09-26 DIAGNOSIS — A419 Sepsis, unspecified organism: Secondary | ICD-10-CM

## 2024-09-26 NOTE — Patient Instructions (Signed)
 Visit Information  Thank you for taking time to visit with me today. Please don't hesitate to contact me if I can be of assistance to you before our next scheduled telephone appointment.  Our next appointment is by telephone on Friday January 23rd at 10:30am  Following is a copy of your care plan:   Goals Addressed             This Visit's Progress    VBCI Transitions of Care (TOC) Care Plan       Problems:  Recent Hospitalization for treatment of Sepsis Functional/Safety concern: The patient is mostly bedbound with wounds and her spouse is her caregiver, Medication access barrier Due to finannces, and Polypharmacy  Goal:  Over the next 30 days, the patient will not experience hospital readmission  Interventions:   Evaluation of current treatment plan related to Sepsis and multiple wounds, Financial constraints related to Insurance changes, maybe lack of insurance and ADL IADL limitations self-management and patient's adherence to plan as established by provider. Discussed plans with patient for ongoing care management follow up and provided patient with direct contact information for care management team Provided education to patient re: current financial constraints, increasing mobility, allowing [facility] to come in for foley care Reviewed medications with patient and discussed polypharmacy, diabetes medications, antibiotics Reviewed scheduled/upcoming provider appointments including Video Visit with Mabel Pry January 2 Pharmacy referral for polypharmacy, financial concerns Discussed plans with patient for ongoing care management follow up and provided patient with direct contact information for care management team Assessed social determinant of health barriers Foley catheter care Wound Care to open areas on buttocks, labia and breast folds  Patient Self Care Activities:  Attend all scheduled provider appointments Call pharmacy for medication refills 3-7  days in advance of running out of medications Call provider office for new concerns or questions  Notify RN Care Manager of [facility] call rescheduling needs Participate in Transition of Care Program/Att[facility] scheduled calls Take medications as prescribed    Plan:  Telephone follow up appointment with care management team member scheduled for:  Friday January 23rd at 10:30am        Patient verbalizes understanding of instructions and care plan provided today and agrees to view in MyChart. Active MyChart status and patient understanding of how to access instructions and care plan via MyChart confirmed with patient.     The patient has been provided with contact information for the care management team and has been advised to call with any health related questions or concerns.   Please call the care guide team at [phone] if you need to cancel or reschedule your appointment.   Please call the Suicide and Crisis Lifeline: 988 call the [place]  National Suicide Prevention Lifeline: 253-645-1194 or TTY: 787 838 5623 TTY [phone]) to talk to a trained counselor if you are experiencing a Mental Health or Behavioral Health Crisis or need someone to talk to. Medford Balboa, BSN, RN [place]  VBCI - [employer] Health RN Care Manager [phone]

## 2024-09-26 NOTE — Transitions of Care (Post Inpatient/ED Visit) (Signed)
 "  09/26/2024  Name: [name] MRN: [id number] DOB: 1960-12-06  Today's TOC FU Call Status: Today's TOC FU Call Status:: Successful TOC FU Call Completed TOC FU Call Complete Date: 09/26/24  Patient's Name and Date of Birth confirmed. Name, DOB  Transition Care Management Follow-up Telephone Call Date of Discharge: 09/25/24 Discharge Facility: [name]) Type of Discharge: Inpatient Admission Primary Inpatient Discharge Diagnosis:: Sepsis, UTI How have you been since you were released from the [name]?: Better Any questions or concerns?: No  Items Reviewed: Did you receive and understand the discharge instructions provided?: Yes Medications obtained,verified, and reconciled?: Yes (Medications Reviewed) Any new allergies since your discharge?: No Dietary orders reviewed?: Yes Type of Diet Ordered:: Diabetic Do you have support at home?: Yes People in Home [RPT]: spouse Name of Support/Comfort Primary Source: Garrel Riffle  Medications Reviewed Today: Medications Reviewed Today     Reviewed by Moises Reusing, RN (Case Manager) on 09/26/24 at 1041  Med List Status: <None>   Medication Order Taking? Sig Documenting Provider Last Dose Status Informant  AFRIN 12 HOUR 0.05 % nasal spray [id number]  Place 1 spray into both nostrils 2 (two) times daily as needed for congestion. [provider]  Active Self  albuterol  (PROVENTIL ) (2.5 MG/3ML) 0.083% nebulizer solution [id number]  USE 1 VIAL IN NEBULIZER EVERY 4 HOURS AS NEEDED FOR WHEEZING FOR SHORTNESS OF BREATH  Patient taking differently: Take 2.5 mg by nebulization every 4 (four) hours as needed for shortness of breath or wheezing.   [name], DO  Active Self  albuterol  (VENTOLIN  HFA) 108 (90 Base) MCG/ACT inhaler [id number]  Inhale 2 puffs into the lungs every 4 (four) hours as needed for wheezing or shortness of breath. [name], DO  Active Self  ALEVE 220 MG tablet [id number]  Take  220 mg by mouth 2 (two) times daily as needed (for pain). [provider]  Active Self  augmented betamethasone  dipropionate (DIPROLENE -AF) 0.05 % cream [id number]  Apply twice daily as needed.  Patient taking differently: Apply 1 application  topically 2 (two) times daily as needed (for irritation- affected areas).   [name], DO  Active Self  BANOPHEN  25 MG tablet [id number]  Take 25 mg by mouth 2 (two) times daily as needed for itching. [provider]  Active Self  Continuous Glucose Sensor (FREESTYLE LIBRE 3 PLUS SENSOR) MISC [id number]  Change sensor every 15 days. [name], DO  Active Self  dapagliflozin  propanediol (FARXIGA ) 10 MG TABS tablet [id number]  Take 1 tablet (10 mg total) by mouth daily before breakfast. [name], DO  Active Self  diclofenac  (FLECTOR ) 1.3 % 1 patch [id number]   [name], DO  Active   diclofenac  (FLECTOR ) 1.3 % [facility] [id number]  Place 1 patch onto the skin 2 (two) times daily.  Patient not taking: Reported on 09/23/2024   [name], DO  Active Self  diclofenac  Sodium (PENNSAID ) 2 % SOLN [id number]  Apply 2 Pump (40 mg total) topically 2 (two) times daily as needed. 1 application to back twice daily  Patient taking differently: Apply 2 Pump topically 2 (two) times daily as needed (for pain - affected area).   [name], DO  Active Self  diltiazem  (CARDIZEM  CD) 360 MG 24 hr capsule [id number]  Take 1 capsule (360 mg total) by mouth daily.  Patient taking differently: Take 360 mg by mouth at bedtime.   [name], DO  Active Self  ELIQUIS  5 MG TABS tablet [id number]  Take 1 tablet by mouth twice daily  Patient taking differently: Take 5 mg by mouth in the morning and at bedtime.   [name], DO  Active Self  EPINEPHrine  0.3 mg/0.3 mL IJ SOAJ injection [id number]  Inject 0.3 mg into the muscle as needed for anaphylaxis. [name], DO   Active Self  ferrous sulfate  (FEROSUL) 325 (65 FE) MG tablet [id number]  Take 1 tablet (325 mg total) by mouth daily with breakfast.  Patient not taking: Reported on 09/23/2024   [name], DO  Active Self  fluconazole  (DIFLUCAN ) 150 MG tablet [id number]  TAKE 1 TABLET BY MOUTH NOW, REPEAT  IN  72  HOURS  IF  NO  IMPROVEMENT  Patient taking differently: Take 150 mg by mouth See admin instructions. Take 150 mg by mouth on 09/21/2024 and repeat on 09/24/2024   [name], DO  Active Self           Med Note [name], [name] [name] Sep 23, 2024  9:33 PM) PROVIDER: The patient stated she needs another tablet on 09/24/2024  fluticasone  (FLONASE ) 50 MCG/ACT nasal spray [id number]  Place 2 sprays into both nostrils daily as needed for allergies or rhinitis.  Patient taking differently: Place 2 sprays into both nostrils in the morning and at bedtime.   [name], DO  Active Self  fluticasone -salmeterol (ADVAIR) 100-50 MCG/ACT AEPB [id number]  Inhale 1 puff into the lungs 2 (two) times daily. [name], DO  Active Self  furosemide  (LASIX ) 40 MG tablet [id number]  Take 1 tablet (40 mg total) by mouth daily. [name], DO  Active Self  gabapentin  (NEURONTIN ) 400 MG capsule [id number]  Take 1 capsule (400 mg total) by mouth 4 (four) times daily. TAKE 1 CAPSULE BY MOUTH IN THE MORNING AND 1 AT NOON AND 2 AT BEDTIME Strength: 400 mg  Patient taking differently: Take 400-800 mg by mouth See admin instructions. Take 400 mg by mouth with breakfast & at midday and 800 mg at bedtime   [name], DO  Active Self  gentamicin ointment (GARAMYCIN) 0.1 % [id number]  Apply 1 Application topically 2 (two) times daily as needed (for wound care). [provider]  Active Self  Glucagon , rDNA, (GLUCAGON  EMERGENCY) 1 MG KIT [id number]  Inject 1 mg into the vein daily as needed. [name], DO  Active Self  Insulin  Glargine (BASAGLAR   KWIKPEN) 100 UNIT/ML [id number]  Inject 60 Units into the skin at bedtime. [name], DO  Active Self  insulin  lispro (HUMALOG  KWIKPEN) 200 UNIT/ML KwikPen [id number]  Inject 60 Units into the skin 3 (three) times daily with meals.  Patient taking differently: Inject 25-35 Units into the skin 3 (three) times daily with meals.   [name], DO  Active Self  Insulin  Syringe-Needle U-100 (INSULIN  SYRINGE .5CC/30GX5/16) 30G X 5/16 0.5 ML MISC [id number]  Take as directed with Novolog  inuslin.  15-20 units per day [name], DO  Active   Insulin  Syringes, Disposable, U-100 0.5 ML MISC [id number]  100 each by Does not apply route 4 (four) times daily -  before meals and at bedtime. Caleen Burgess BROCKS, MD  Active   isosorbide  mononitrate (IMDUR ) 60 MG 24 hr tablet [id number]  Take 1 tablet (60 mg total) by mouth daily.  Patient taking differently: Take 60 mg by mouth at bedtime.   [name], DO  Active  Self  levocetirizine (XYZAL ) 5 MG tablet [id number]  Take 1 tablet (5 mg total) by mouth every evening. [name], DO  Active Self  levothyroxine  (SYNTHROID ) 200 MCG tablet [id number]  Take 1 tablet (200 mcg total) by mouth daily before breakfast. [name], DO  Active Self  lidocaine  (LIDODERM ) 5 % [id number]  Place 1 patch onto the skin every 12 (twelve) hours. Remove & Discard patch within 12 hours or as directed  Patient not taking: Reported on 09/23/2024   Debby Fidela CROME, NP  Active Self  lidocaine  (XYLOCAINE ) 5 % ointment [id number]  Apply 1 Application topically 2 (two) times daily as needed for mild pain (pain score 1-3). [provider]  Active Self  metFORMIN  (GLUCOPHAGE -XR) 500 MG 24 hr tablet [id number]  Take 2 tablets (1,000 mg total) by mouth 2 (two) times daily. [name], DO  Active Self  MOUNJARO  5 MG/0.5ML Pen [id number]  Inject 5 mg into the skin every Sunday.  Patient not taking: Reported on  09/26/2024   [provider]  Active Self  Multiple Vitamins-Minerals (WOMENS 50+ MULTI VITAMIN PO) [id number]  Take 1 tablet by mouth in the morning and at bedtime. [provider]  Active   mupirocin  ointment (BACTROBAN ) 2 % [id number]  APPLY  OINTMENT TOPICALLY TWICE DAILY  Patient taking differently: Apply 1 Application topically See admin instructions. Apply to any sores 2 times a day   [name], DO  Active Self  naloxone  (NARCAN ) nasal spray 4 mg/0.1 mL [id number]  Place 1 spray into the nose as needed (opoid overdose). [name], DO  Active Self  nitroGLYCERIN  (NITROSTAT ) 0.4 MG SL tablet [id number]  Place 1 tablet (0.4 mg total) under the tongue every 5 (five) minutes as needed for chest pain. [name], DO  Active Self  nystatin  (MYCOSTATIN /NYSTOP ) powder [id number]  Apply 1 Application topically as needed (as directed for yeast infections). [provider]  Active Self  omeprazole  (PRILOSEC) 40 MG capsule [id number]  Take 1 capsule (40 mg total) by mouth daily.  Patient taking differently: Take 40 mg by mouth at bedtime.   [name], DO  Active Self  OneTouch Delica Lancets 33G [place] [id number]  Use to check blood glucose up to 4 times a day [name], DO  Active   [facility] VERIO test strip [id number]  USE AS DIRECTED [name], DO  Active   Oxycodone  HCl 20 MG TABS [id number]  Take 1 tablet (20 mg total) by mouth in the morning, at noon, and at bedtime.  Patient taking differently: Take 20 mg by mouth See admin instructions. Take 20 mg by mouth every six to eight hours   Wendling, [name], DO  Active Self  Polyethyl Glyc-Propyl Glyc PF (SYSTANE PRESERVATIVE FREE) 0.4-0.3 % SOLN [id number]  Place 2 drops into both eyes in the morning and at bedtime. [provider]  Active Self  pramipexole  (MIRAPEX ) 1 MG tablet [id number]  Take 1 tablet (1 mg total) by mouth 3 (three) times  daily. [name], DO  Active Self  promethazine  (PHENERGAN ) 25 MG tablet [id number]  Take 1 tablet (25 mg total) by mouth as needed for vomiting or nausea.  Patient taking differently: Take 25 mg by mouth 3 (three) times daily as needed for nausea or vomiting.   [name], DO  Active Self  spironolactone  (ALDACTONE ) 50 MG tablet [id number]  Take 1 tablet by mouth once daily  Patient taking  differently: Take 50 mg by mouth daily at 12 noon.   [name], DO  Active Self  sulfamethoxazole -trimethoprim  (BACTRIM  DS) 800-160 MG tablet [id number]  Take 1 tablet by mouth 2 (two) times daily for 7 days. Rizwan, Saima, MD  Active   tirzepatide  (MOUNJARO ) 7.5 MG/0.5ML Pen [id number] Yes Inject 7.5 mg into the skin once a week. [name], DO  Active Self  Vitamin D , Ergocalciferol , (DRISDOL ) 1.25 MG (50000 UNIT) CAPS capsule [id number]  Take 1 capsule by mouth once a week  Patient taking differently: Take 50,000 Units by mouth every Friday.   [name], DO  Active Self            Home Care and Equipment/Supplies: Were Home Health Services Ordered?: Yes Name of Home Health Agency:: [phone] Has Agency set up a time to come to your home?: No EMR reviewed for Home Health Orders: Orders present/patient has not received call (refer to CM for follow-up) Any new equipment or medical supplies ordered?: NA  Functional Questionnaire: Do you need assistance with bathing/showering or dressing?: Yes (The patient is mostly bedbound. Spouse assist) Do you need assistance with meal preparation?: Yes (The patient is mostly bedbound. Spouse assist) Do you need assistance with eating?: No Do you have difficulty maintaining continence: Yes (The patient has a foley catheter. She is incontinent of bowel) Do you need assistance with getting out of bed/getting out of a chair/moving?: Yes Do you have difficulty managing or taking your medications?:  No  Follow up appointments reviewed: PCP Follow-up appointment confirmed?: Yes Date of PCP follow-up appointment?: 09/30/24 Follow-up Provider: Mabel Frann Driscilla Lionel Follow-up appointment confirmed?: No Reason Specialist Follow-Up Not Confirmed: Patient has Specialist Provider Number and will Call for Appointment (The patient is supposed to call the wound care clinic) Do you need transportation to your follow-up appointment?: No Do you understand care options if your condition(s) worsen?: Yes-patient verbalized understanding  SDOH Interventions Today    Flowsheet Row Most Recent Value  SDOH Interventions   Food Insecurity Interventions Intervention Not Indicated  Housing Interventions Intervention Not Indicated  Transportation Interventions Intervention Not Indicated  Utilities Interventions Intervention Not Indicated    Goals Addressed             This Visit's Progress    VBCI Transitions of Care (TOC) Care Plan       Problems:  Recent Hospitalization for treatment of Sepsis Functional/Safety concern: The patient is mostly bedbound with wounds and her spouse is her caregiver, Medication access barrier Due to finannces, and Polypharmacy  Goal:  Over the next 30 days, the patient will not experience [name] readmission  Interventions:   Evaluation of current treatment plan related to Sepsis and multiple wounds, Financial constraints related to Insurance changes, maybe lack of insurance and ADL IADL limitations self-management and patient's adherence to plan as established by provider. Discussed plans with patient for ongoing care management follow up and provided patient with direct contact information for care management team Provided education to patient re: current financial constraints, increasing mobility, allowing [facility] to come in for foley care Reviewed medications with patient and discussed polypharmacy, diabetes medications,  antibiotics Reviewed scheduled/upcoming provider appointments including Video Visit with Mabel Frann January 2 Pharmacy referral for polypharmacy, financial concerns Discussed plans with patient for ongoing care management follow up and provided patient with direct contact information for care management team Assessed social determinant of health barriers Foley catheter care Wound Care to open areas on buttocks,  labia and breast folds  Patient Self Care Activities:  Attend all scheduled provider appointments Call pharmacy for medication refills 3-7 days in advance of running out of medications Call provider office for new concerns or questions  Notify RN Care Manager of [facility] call rescheduling needs Participate in Transition of Care Program/Attend [facility] scheduled calls Take medications as prescribed    Plan:  Telephone follow up appointment with care management team member scheduled for:  Friday January 23rd at 10:30am        Medford Balboa, BSN, RN [place]  VBCI - Population Health RN Care Manager [phone]  "

## 2024-09-26 NOTE — Addendum Note (Signed)
 Addended by: Kalana Yust on: 09/26/2024 01:35 PM   Modules accepted: Orders

## 2024-09-28 LAB — CULTURE, BLOOD (ROUTINE X 2)
Culture: NO GROWTH
Culture: NO GROWTH
Special Requests: ADEQUATE

## 2024-09-30 ENCOUNTER — Telehealth (INDEPENDENT_AMBULATORY_CARE_PROVIDER_SITE_OTHER): Admitting: Family Medicine

## 2024-09-30 ENCOUNTER — Encounter: Payer: Self-pay | Admitting: Family Medicine

## 2024-09-30 DIAGNOSIS — Z599 Problem related to housing and economic circumstances, unspecified: Secondary | ICD-10-CM | POA: Diagnosis not present

## 2024-09-30 DIAGNOSIS — B3731 Acute candidiasis of vulva and vagina: Secondary | ICD-10-CM | POA: Diagnosis not present

## 2024-09-30 MED ORDER — FLUCONAZOLE 150 MG PO TABS: ORAL_TABLET | ORAL | 0 refills | Status: AC

## 2024-09-30 NOTE — Progress Notes (Signed)
 Chief Complaint  Patient presents with   Hospitalization Follow-up    Hospital Follow Up    Subjective: Patient is a 64 y.o. female here for hosp f/u. We are interacting via web portal for an electronic face-to-face visit. I verified patient's ID using 2 identifiers. Patient agreed to proceed with visit via this method. Patient is at home, I am at office. Patient and I are present for visit.   Patient was admitted to [facility] on 1/13 and discharged on 09/25/2024 for sepsis secondary to catheter associated UTI.  She was placed on broad-spectrum antibiotics. Transitioned to Bactrim  which she is currently taking. Having yeast from this.  Diflucan  typically works.  She is no longer having fevers.  Nausea is at its baseline.  She has a Foley catheter in due to immobilization.  She historically has had wounds on her extremities.  These are doing well per her.  She has some pain over her tailbone area.  Her husband has been watching and notices no skin changes.  She feels much better from the hospitalization.  Home health nursing is not currently coming as she does not have insurance.  Past Medical History:  Diagnosis Date   Aortic stenosis    mild AS by echo 02/2021   Asthma    on daily RX and rescue inhaler (04/18/2018)   Chronic diastolic CHF (congestive heart failure) (HCC) 09/2015   Chronic lower back pain    Chronic neck pain    Chronic pain syndrome    Fentanyl  Patch   Colon polyps    Coronary artery disease    cath with normal LM, 30% LAD, 85% mid RCA and 95% distal RCA s/p PCI of the mid to distal RCA and now on DAPT with ASA and Ticagrelor .     Eczema    Excessive daytime sleepiness 11/26/2015   Fibromyalgia    Gallstones    GERD (gastroesophageal reflux disease)    Heart murmur    noted for the 1st time on 04/18/2018   History of blood transfusion 07/2010   S/P oophorectomy   History of gout    History of hiatal hernia 1980s   gone now (04/18/2018)   Hyperlipidemia     Hypertension    takes Metoprolol  and Enalapril  daily   Hypothyroidism    takes Synthroid  daily   IBS (irritable bowel syndrome)    Migraine    nothing in the 2000s (04/18/2018)   Mixed connective tissue disease    NAFLD (nonalcoholic fatty liver disease)    Pneumonia    several times (04/18/2018)   PVC's (premature ventricular contractions)    noted on event monitor 02/2021   Rheumatoid arthritis (HCC)    hands, elbows, shoulders, probably knees (04/18/2018)   Scoliosis    Sjogren's syndrome    Sleep apnea    mild - does not use cpap   Spondylosis    Type II diabetes mellitus (HCC)    takes Metformin  and Hum R daily (04/18/2018)   Walker as ambulation aid    also uses wheelchair    Objective: No conversational dyspnea Age appropriate judgment and insight Nml affect and mood  Assessment and Plan: Yeast infection of the vagina - Plan: fluconazole  (DIFLUCAN ) 150 MG tablet  Economic hardship - Plan: AMB Referral VBCI Care Management  Diflucan  sent in.  5 tablets were sent for as needed use.  GoodRx is an option. Will refer to above team.  She will reach out to [facility] and see  if they will see her as an indigent patient. The patient voiced understanding and agreement to the plan.  I spent 35 minutes with the patient discussing the above plan in addition to reviewing her chart and the same day of the visit.  [name] Hubbard, DO 09/30/24  12:19 PM

## 2024-10-01 ENCOUNTER — Telehealth: Payer: Self-pay

## 2024-10-01 NOTE — Progress Notes (Signed)
 Complex Care Management Note  Care Guide Note 10/01/2024 Name: [name] MRN: [id number] DOB: 1961/07/11  [name] is a 65 y.o. year old female who sees Frann, [name], DO for primary care. I reached out to [facility] by phone today to offer complex care management services.  [name] was given information about Complex Care Management services today including:   The Complex Care Management services include support from the care team which includes your Nurse Care Manager, Clinical Social Worker, or Pharmacist.  The Complex Care Management team is here to help remove barriers to the health concerns and goals most important to you. Complex Care Management services are voluntary, and the patient may decline or stop services at any time by request to their care team member.   Complex Care Management Consent Status: Patient did not agree to participate in complex care management services at this time.  Follow up plan:  Patient will follow up with VBCI TOC RN.  Encounter Outcome:  Patient Refused  [place] Agent [facility], [facility] VBCI Assistant Direct Dial: [phone]  Fax: [phone]

## 2024-10-03 ENCOUNTER — Other Ambulatory Visit: Payer: Self-pay

## 2024-10-03 NOTE — Transitions of Care (Post Inpatient/ED Visit) (Signed)
 " Transition of Care week 2  Visit Note  10/03/2024  Name: [name] MRN: [id number]          DOB: 30-Nov-1960  Situation: Patient enrolled in [facility] 30-day program. Visit completed with [name] by telephone.   Background:   Initial Transition Care Management Follow-up Telephone Call Discharge Date and Diagnosis: 09/25/24, Sepsis, UTI   Past Medical History:  Diagnosis Date   Aortic stenosis    mild AS by echo 02/2021   Asthma    on daily RX and rescue inhaler (04/18/2018)   Chronic diastolic CHF (congestive heart failure) (HCC) 09/2015   Chronic lower back pain    Chronic neck pain    Chronic pain syndrome    Fentanyl  Patch   Colon polyps    Coronary artery disease    cath with normal LM, 30% LAD, 85% mid RCA and 95% distal RCA s/p PCI of the mid to distal RCA and now on DAPT with ASA and Ticagrelor .     Eczema    Excessive daytime sleepiness 11/26/2015   Fibromyalgia    Gallstones    GERD (gastroesophageal reflux disease)    Heart murmur    noted for the 1st time on 04/18/2018   History of blood transfusion 07/2010   S/P oophorectomy   History of gout    History of hiatal hernia 1980s   gone now (04/18/2018)   Hyperlipidemia    Hypertension    takes Metoprolol  and Enalapril  daily   Hypothyroidism    takes Synthroid  daily   IBS (irritable bowel syndrome)    Migraine    nothing in the 2000s (04/18/2018)   Mixed connective tissue disease    NAFLD (nonalcoholic fatty liver disease)    Pneumonia    several times (04/18/2018)   PVC's (premature ventricular contractions)    noted on event monitor 02/2021   Rheumatoid arthritis (HCC)    hands, elbows, shoulders, probably knees (04/18/2018)   Scoliosis    Sjogren's syndrome    Sleep apnea    mild - does not use cpap   Spondylosis    Type II diabetes mellitus (HCC)    takes Metformin  and Hum R daily (04/18/2018)   Walker as ambulation aid    also uses wheelchair    Assessment: Patient Reported  Symptoms: Cognitive Cognitive Status: Alert and oriented to person, place, and time, Normal speech and language skills      Neurological Neurological Review of Symptoms: No symptoms reported    HEENT HEENT Symptoms Reported: No symptoms reported      Cardiovascular Cardiovascular Symptoms Reported: Swelling in legs or feet Does patient have uncontrolled Hypertension?: No Cardiovascular Management Strategies: Medication therapy, Routine screening, Coping strategies Cardiovascular Comment: The patient is taking Diflucan  for yeast infection secondary to antibiotics  Respiratory Respiratory Symptoms Reported: No symptoms reported Respiratory Management Strategies: Routine screening, Medication therapy, Activity, Adequate rest  Endocrine Endocrine Symptoms Reported: No symptoms reported Is patient diabetic?: Yes Is patient checking blood sugars at home?: Yes List most recent blood sugar readings, include date and time of day: 100    Gastrointestinal Gastrointestinal Symptoms Reported: Incontinence Additional Gastrointestinal Details: Bedpan and then spouse cleans her up Gastrointestinal Management Strategies: Incontinence garment/pad, Coping strategies Gastrointestinal Comment: The patient is mostly bedbound    Genitourinary Genitourinary Symptoms Reported: Other Other Genitourinary Symptoms: Foley catheter Genitourinary Management Strategies: Catheter, indwelling Indwelling Catheter Inserted: 09/24/24 Genitourinary Comment: The patient has frequent UTI's  Integumentary Integumentary Symptoms Reported: Wound Additional Integumentary Details:  Buttocks wounds have healed. She has some spots under breast folds and labia. She states the rawness on her right foot is better Skin Management Strategies: Dressing changes, Coping strategies, Routine screening, Medication therapy Skin Comment: The patient states she has her own mix of antibiotics and other substances that she mixes together and puts  on her wounds which provide healing. 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 She stands for a couple of seconds on occasion. Otherwise she is in the bed Musculoskeletal Management Strategies: Routine screening, Coping strategies Musculoskeletal Comment: The patient was going to check with [facility]? She does not currently have insurance      Psychosocial Psychosocial Symptoms Reported: No symptoms reported         There were no vitals filed for this visit. Pain Scale: 0-10 Pain Score: 4  Pain Type: Chronic pain Pain Location: Buttocks Pain Orientation: Mid Pain Descriptors / Indicators: Sharp, Discomfort Pain Onset: On-going Pain Intervention(s): Medication (See eMAR)  Medications Reviewed Today     Reviewed by Moises Reusing, RN (Case Manager) on 10/03/24 at 1035  Med List Status: <None>   Medication Order Taking? Sig Documenting Provider Last Dose Status Informant  AFRIN 12 HOUR 0.05 % nasal spray [id number] No Place 1 spray into both nostrils 2 (two) times daily as needed for congestion. [provider] Unknown Active Self  albuterol  (PROVENTIL ) (2.5 MG/3ML) 0.083% nebulizer solution [id number] No USE 1 VIAL IN NEBULIZER EVERY 4 HOURS AS NEEDED FOR WHEEZING FOR SHORTNESS OF BREATH  Patient taking differently: Take 2.5 mg by nebulization every 4 (four) hours as needed for shortness of breath or wheezing.   [name], DO Unknown Active Self  albuterol  (VENTOLIN  HFA) 108 (90 Base) MCG/ACT inhaler [id number] No Inhale 2 puffs into the lungs every 4 (four) hours as needed for wheezing or shortness of breath. [name], DO Unknown Active Self  ALEVE 220 MG tablet [id number] No Take 220 mg by mouth 2 (two) times daily as needed (for pain). [provider] Past Week Active Self  BANOPHEN  25 MG tablet  [id number] No Take 25 mg by mouth 2 (two) times daily as needed for itching. [provider] Unknown Active Self  Continuous Glucose Sensor (FREESTYLE LIBRE 3 PLUS SENSOR) MISC [id number] No Change sensor every 15 days. [name], DO Unknown Active Self  dapagliflozin  propanediol (FARXIGA ) 10 MG TABS tablet [id number] No Take 1 tablet (10 mg total) by mouth daily before breakfast. [name], DO 09/22/2024 Morning Active Self  diclofenac  (FLECTOR ) 1.3 % 1 patch [id number]   [name], DO  Active   diclofenac  Sodium (PENNSAID ) 2 % SOLN [id number] No Apply 2 Pump (40 mg total) topically 2 (two) times daily as needed. 1 application to back twice daily  Patient taking differently: Apply 2 Pump topically 2 (two) times daily as needed (for pain - affected area).   [name], DO Past Week Active Self  diltiazem  (CARDIZEM  CD) 360 MG 24 hr capsule [id number] No Take 1 capsule (360 mg total) by mouth daily.  Patient taking differently: Take 360 mg by mouth at bedtime.   [name], DO 09/22/2024 Bedtime Active Self  ELIQUIS  5 MG TABS tablet [id number] No Take 1 tablet by mouth twice daily  Patient taking differently: Take 5 mg by mouth in the morning and at bedtime.   [name],  DO 09/22/2024 11:00 PM Active Self  EPINEPHrine  0.3 mg/0.3 mL IJ SOAJ injection [id number] No Inject 0.3 mg into the muscle as needed for anaphylaxis. [name], DO Unknown Active Self  ferrous sulfate  (FEROSUL) 325 (65 FE) MG tablet [id number] No Take 1 tablet (325 mg total) by mouth daily with breakfast.  Patient not taking: Reported on 09/23/2024   [name], DO Not Taking Active Self  fluconazole  (DIFLUCAN ) 150 MG tablet [id number]  TAKE 1 TABLET BY MOUTH NOW, REPEAT  IN  72  HOURS  IF  NO  IMPROVEMENT [name], DO  Active   fluticasone  (FLONASE ) 50 MCG/ACT nasal spray [id number] No Place 2 sprays into both  nostrils daily as needed for allergies or rhinitis.  Patient taking differently: Place 2 sprays into both nostrils in the morning and at bedtime.   [name], DO 09/22/2024 Bedtime Active Self  fluticasone -salmeterol (ADVAIR) 100-50 MCG/ACT AEPB [id number] No Inhale 1 puff into the lungs 2 (two) times daily. [name], DO 09/21/2024 Active Self  furosemide  (LASIX ) 40 MG tablet [id number] No Take 1 tablet (40 mg total) by mouth daily. [name], DO 09/22/2024 Morning Active Self  gabapentin  (NEURONTIN ) 400 MG capsule [id number] No Take 1 capsule (400 mg total) by mouth 4 (four) times daily. TAKE 1 CAPSULE BY MOUTH IN THE MORNING AND 1 AT NOON AND 2 AT BEDTIME Strength: 400 mg  Patient taking differently: Take 400-800 mg by mouth See admin instructions. Take 400 mg by mouth with breakfast & at midday and 800 mg at bedtime   [name], DO 09/22/2024 Bedtime Active Self  gentamicin ointment (GARAMYCIN) 0.1 % [id number]  Apply 1 Application topically 2 (two) times daily as needed (for wound care). [provider]  Active Self  Glucagon , rDNA, (GLUCAGON  EMERGENCY) 1 MG KIT [id number] No Inject 1 mg into the vein daily as needed. [name], DO Unknown Active Self  Insulin  Glargine (BASAGLAR  KWIKPEN) 100 UNIT/ML [id number] No Inject 60 Units into the skin at bedtime. [name], DO 09/21/2024 Active Self  insulin  lispro (HUMALOG  KWIKPEN) 200 UNIT/ML KwikPen [id number] No Inject 60 Units into the skin 3 (three) times daily with meals.  Patient taking differently: Inject 25-35 Units into the skin 3 (three) times daily with meals.   [name], DO 09/22/2024 Noon Active Self  Insulin  Syringe-Needle U-100 (INSULIN  SYRINGE .5CC/30GX5/16) 30G X 5/16 0.5 ML MISC [id number]  Take as directed with Novolog  inuslin.  15-20 units per day [name], DO  Active   Insulin  Syringes, Disposable, U-100 0.5 ML MISC  [id number]  100 each by Does not apply route 4 (four) times daily -  before meals and at bedtime. Caleen Burgess BROCKS, MD  Active   isosorbide  mononitrate (IMDUR ) 60 MG 24 hr tablet [id number] No Take 1 tablet (60 mg total) by mouth daily.  Patient taking differently: Take 60 mg by mouth at bedtime.   [name], DO 09/21/2024 Active Self  levocetirizine (XYZAL ) 5 MG tablet [id number] No Take 1 tablet (5 mg total) by mouth every evening. [name], DO 09/21/2024 Active Self  levothyroxine  (SYNTHROID ) 200 MCG tablet [id number] No Take 1 tablet (200 mcg total) by mouth daily before breakfast. [name], DO 09/22/2024 Morning Active Self  lidocaine  (LIDODERM ) 5 % [id number] No Place 1 patch onto the skin every 12 (twelve) hours. Remove & Discard patch within 12 hours or as directed  Patient not taking: Reported on  09/23/2024   Debby Fidela CROME, NP Not Taking Active Self  lidocaine  (XYLOCAINE ) 5 % ointment [id number] No Apply 1 Application topically 2 (two) times daily as needed for mild pain (pain score 1-3). [provider] Unknown Active Self  metFORMIN  (GLUCOPHAGE -XR) 500 MG 24 hr tablet [id number] No Take 2 tablets (1,000 mg total) by mouth 2 (two) times daily. [name], DO 09/22/2024 Morning Active Self  MOUNJARO  5 MG/0.5ML Pen [id number]  Inject 5 mg into the skin every Sunday.  Patient not taking: Reported on 09/26/2024   [provider]  Active Self  Multiple Vitamins-Minerals (WOMENS 50+ MULTI VITAMIN PO) [id number] No Take 1 tablet by mouth in the morning and at bedtime. [provider] Unknown Active   mupirocin  ointment (BACTROBAN ) 2 % [id number] No APPLY  OINTMENT TOPICALLY TWICE DAILY  Patient taking differently: Apply 1 Application topically See admin instructions. Apply to any sores 2 times a day   [name], DO Unknown Active Self  naloxone  (NARCAN ) nasal spray 4 mg/0.1 mL [id number] No Place 1 spray into the  nose as needed (opoid overdose). [name], DO Unknown Active Self  nitroGLYCERIN  (NITROSTAT ) 0.4 MG SL tablet [id number] No Place 1 tablet (0.4 mg total) under the tongue every 5 (five) minutes as needed for chest pain. [name], DO Unknown Active Self  nystatin  (MYCOSTATIN /NYSTOP ) powder [id number] No Apply 1 Application topically as needed (as directed for yeast infections). [provider] Unknown Active Self  omeprazole  (PRILOSEC) 40 MG capsule [id number] No Take 1 capsule (40 mg total) by mouth daily.  Patient taking differently: Take 40 mg by mouth at bedtime.   [name], DO 09/21/2024 Active Self  OneTouch Delica Lancets 33G MISC [id number]  Use to check blood glucose up to 4 times a day [name], DO  Active   [facility] VERIO test strip [id number]  USE AS DIRECTED [name], DO  Active   Oxycodone  HCl 20 MG TABS [id number] No Take 1 tablet (20 mg total) by mouth in the morning, at noon, and at bedtime.  Patient taking differently: Take 20 mg by mouth See admin instructions. Take 20 mg by mouth every six to eight hours   [name], DO 09/22/2024 Noon Active Self  Polyethyl Glyc-Propyl Glyc PF (SYSTANE PRESERVATIVE FREE) 0.4-0.3 % SOLN [id number] No Place 2 drops into both eyes in the morning and at bedtime. [provider] 09/22/2024 Morning Active Self  pramipexole  (MIRAPEX ) 1 MG tablet [id number] No Take 1 tablet (1 mg total) by mouth 3 (three) times daily. [name], DO 09/22/2024 Morning Active Self  promethazine  (PHENERGAN ) 25 MG tablet [id number] No Take 1 tablet (25 mg total) by mouth as needed for vomiting or nausea.  Patient taking differently: Take 25 mg by mouth 3 (three) times daily as needed for nausea or vomiting.   [name], DO Past Week Active Self  spironolactone  (ALDACTONE ) 50 MG tablet [id number] No Take 1 tablet by mouth once daily  Patient taking  differently: Take 50 mg by mouth daily at 12 noon.   [name], DO 09/21/2024 Active Self  tirzepatide  (MOUNJARO ) 7.5 MG/0.5ML Pen [id number]  Inject 7.5 mg into the skin once a week. [name], DO  Active Self  Vitamin D , Ergocalciferol , (DRISDOL ) 1.25 MG (50000 UNIT) CAPS capsule [id number] No Take 1 capsule by mouth once a week  Patient taking differently: Take 50,000 Units by mouth every Friday.   Frann Mabel  Deward, DO 09/19/2024 Active Self            Recommendation:   Continue Current Plan of Care  Follow Up Plan:   Telephone follow-up in 1 week  Medford Balboa, BSN, RN [place]  VBCI - [facility] RN Care Manager [phone]     "

## 2024-10-03 NOTE — Patient Instructions (Signed)
 Visit Information  Thank you for taking time to visit with me today. Please don't hesitate to contact me if I can be of assistance to you before our next scheduled telephone appointment.  Our next appointment is by telephone on January 30th at 10:30am  Following is a copy of your care plan:   Goals Addressed             This Visit's Progress    VBCI Transitions of Care (TOC) Care Plan       Problems: (reviewed 10/03/24) Recent Hospitalization for treatment of Sepsis Functional/Safety concern: The patient is mostly bedbound with wounds and her spouse is her caregiver, Medication access barrier Due to finances, and Polypharmacy  Goal: (reviewed 10/03/24) Over the next 30 days, the patient will not experience hospital readmission  Interventions: (reviewed 10/03/24)  Evaluation of current treatment plan related to Sepsis and multiple wounds, Financial constraints related to Insurance changes, maybe lack of insurance and ADL IADL limitations self-management and patient's adherence to plan as established by provider. Discussed plans with patient for ongoing care management follow up and provided patient with direct contact information for care management team Provided education to patient re: current financial constraints, increasing mobility, allowing [facility] to come in for foley care Reviewed medications with patient and discussed polypharmacy, diabetes medications, antibiotics Reviewed scheduled/upcoming provider appointments including Video Visit with Mabel Pry January 2 Pharmacy referral for polypharmacy, financial concerns Discussed plans with patient for ongoing care management follow up and provided patient with direct contact information for care management team Assessed social determinant of health barriers Foley catheter care Wound Care to open areas on buttocks, labia and breast folds 10/03/24 - The areas on her buttocks have healed. The breast folds and labia  are improving. 10/03/24 - The patient states that her wound on her foot has healed.   Patient Self Care Activities: (reviewed 10/03/24) Attend all scheduled provider appointments Call pharmacy for medication refills 3-7 days in advance of running out of medications Call provider office for new concerns or questions  Notify RN Care Manager of [facility] call rescheduling needs Participate in Transition of Care Program/Attend [facility] scheduled calls Take medications as prescribed    Plan:  Telephone follow up appointment with care management team member scheduled for:  Friday January 30th at 10:30am        Patient verbalizes understanding of instructions and care plan provided today and agrees to view in MyChart. Active MyChart status and patient understanding of how to access instructions and care plan via MyChart confirmed with patient.     The patient has been provided with contact information for the care management team and has been advised to call with any health related questions or concerns.   Please call the care guide team at [phone] if you need to cancel or reschedule your appointment.   Please call the Suicide and Crisis Lifeline: 988 call the [place]  National Suicide Prevention Lifeline: (249)714-5837 or TTY: (564)485-2024 TTY (743)756-1249) to talk to a trained counselor if you are experiencing a Mental Health or Behavioral Health Crisis or need someone to talk to.  Medford Balboa, BSN, RN [place]  VBCI - [facility] RN Care Manager [phone]

## 2024-10-06 ENCOUNTER — Telehealth: Payer: Self-pay

## 2024-10-06 NOTE — Progress Notes (Signed)
 Complex Care Management Note  Care Guide Note 10/06/2024 Name: [name] MRN: [id number] DOB: 20-Jul-1961  [name] is a 64 y.o. year old female who sees Frann, [name], DO for primary care. I reached out to [facility] by phone today to offer complex care management services.  Ms. Lauf was given information about Complex Care Management services today including:   The Complex Care Management services include support from the care team which includes your Nurse Care Manager, Clinical Social Worker, or Pharmacist.  The Complex Care Management team is here to help remove barriers to the health concerns and goals most important to you. Complex Care Management services are voluntary, and the patient may decline or stop services at any time by request to their care team member.   Complex Care Management Consent Status: Patient agreed to services and verbal consent obtained.   Follow up plan:  Telephone appointment with complex care management team member scheduled for:  10/13/24 & 10/15/24.  Encounter Outcome:  Patient Scheduled  Dreama [facility]  [facility], [facility] VBCI Assistant Direct Dial: [phone]  Fax: [phone]

## 2024-10-08 ENCOUNTER — Other Ambulatory Visit: Payer: Self-pay | Admitting: Family Medicine

## 2024-10-10 ENCOUNTER — Other Ambulatory Visit: Payer: Self-pay | Admitting: Family Medicine

## 2024-10-10 ENCOUNTER — Other Ambulatory Visit: Payer: Self-pay

## 2024-10-10 NOTE — Patient Instructions (Signed)
 Visit Information  Thank you for taking time to visit with me today. Please don't hesitate to contact me if I can be of assistance to you before our next scheduled telephone appointment.  Our next appointment is by telephone on Friday February 6th at 10:30am  Following is a copy of your care plan:   Goals Addressed             This Visit's Progress    VBCI Transitions of Care (TOC) Care Plan       Problems: (reviewed 10/10/24) Recent Hospitalization for treatment of Sepsis Functional/Safety concern: The patient is mostly bedbound with wounds and her spouse is her caregiver, Medication access barrier Due to finances, and Polypharmacy  Goal: (reviewed 10/10/24) Over the next 30 days, the patient will not experience hospital readmission  Interventions: (reviewed 10/10/24)  Evaluation of current treatment plan related to Sepsis and multiple wounds, Financial constraints related to Insurance changes, maybe lack of insurance and ADL IADL limitations self-management and patient's adherence to plan as established by provider. Discussed plans with patient for ongoing care management follow up and provided patient with direct contact information for care management team Provided education to patient re: current financial constraints, increasing mobility, allowing [facility] to come in for foley care Reviewed medications with patient and discussed polypharmacy, diabetes medications, antibiotics Reviewed scheduled/upcoming provider appointments including Video Visit with Mabel Pry January 20  - Completed Pharmacy referral for polypharmacy - appointment set for 10/13/24 Discussed plans with patient for ongoing care management follow up and provided patient with direct contact information for care management team Assessed social determinant of health barriers Referral for Social Work due to Financial concerns - 10/15/24 Foley catheter care Wound Care to open areas on buttocks, labia and  breast folds 10/03/24 - The areas on her buttocks have healed. The breast folds and labia are improving. 10/03/24 - The patient states that her wound on her foot has healed.  10/10/24 - The patient did not want to stay on the phone because she is very nauseated. She states she has this type of nausea at least twice a week.   Patient Self Care Activities: (reviewed 10/10/24) Attend all scheduled provider appointments Call pharmacy for medication refills 3-7 days in advance of running out of medications Call provider office for new concerns or questions  Notify RN Care Manager of [facility] call rescheduling needs Participate in Transition of Care Program/Attend [facility] scheduled calls Take medications as prescribed    Plan:  Telephone follow up appointment with care management team member scheduled for:  Friday February 6th at 10:30am        Patient verbalizes understanding of instructions and care plan provided today and agrees to view in MyChart. Active MyChart status and patient understanding of how to access instructions and care plan via MyChart confirmed with patient.     The patient has been provided with contact information for the care management team and has been advised to call with any health related questions or concerns.   Please call the care guide team at [phone] if you need to cancel or reschedule your appointment.   Please call the Suicide and Crisis Lifeline: 988 call the [place]  National Suicide Prevention Lifeline: 417-212-8939 or TTY: 506-769-9223 TTY 606-274-7404) to talk to a trained counselor if you are experiencing a Mental Health or Behavioral Health Crisis or need someone to talk to.  Medford Balboa, BSN, RN [place]  VBCI - [employer] [facility] RN Care Manager [phone]

## 2024-10-10 NOTE — Transitions of Care (Post Inpatient/ED Visit) (Signed)
 " Transition of Care week 3  Visit Note  10/10/2024  Name: [name] MRN: [id number]          DOB: [birth year]  Situation: Patient enrolled in [facility] 30-day program. Visit completed with Violetta Riffel by telephone.   Background:   Initial Transition Care Management Follow-up Telephone Call Discharge Date and Diagnosis: 09/25/24, Sepsis, UTI   Past Medical History:  Diagnosis Date   Aortic stenosis    mild AS by echo 02/2021   Asthma    on daily RX and rescue inhaler (04/18/2018)   Chronic diastolic CHF (congestive heart failure) (HCC) 09/2015   Chronic lower back pain    Chronic neck pain    Chronic pain syndrome    Fentanyl  Patch   Colon polyps    Coronary artery disease    cath with normal LM, 30% LAD, 85% mid RCA and 95% distal RCA s/p PCI of the mid to distal RCA and now on DAPT with ASA and Ticagrelor .     Eczema    Excessive daytime sleepiness 11/26/2015   Fibromyalgia    Gallstones    GERD (gastroesophageal reflux disease)    Heart murmur    noted for the 1st time on 04/18/2018   History of blood transfusion 07/2010   S/P oophorectomy   History of gout    History of hiatal hernia 1980s   gone now (04/18/2018)   Hyperlipidemia    Hypertension    takes Metoprolol  and Enalapril  daily   Hypothyroidism    takes Synthroid  daily   IBS (irritable bowel syndrome)    Migraine    nothing in the 2000s (04/18/2018)   Mixed connective tissue disease    NAFLD (nonalcoholic fatty liver disease)    Pneumonia    several times (04/18/2018)   PVC's (premature ventricular contractions)    noted on event monitor 02/2021   Rheumatoid arthritis (HCC)    hands, elbows, shoulders, probably knees (04/18/2018)   Scoliosis    Sjogren's syndrome    Sleep apnea    mild - does not use cpap   Spondylosis    Type II diabetes mellitus (HCC)    takes Metformin  and Hum R daily (04/18/2018)   Walker as ambulation aid    also uses wheelchair    Assessment: Patient Reported  Symptoms: Cognitive Cognitive Status: Alert and oriented to person, place, and time, Normal speech and language skills      Neurological Neurological Review of Symptoms: No symptoms reported    HEENT HEENT Symptoms Reported: Not assessed      Cardiovascular Cardiovascular Symptoms Reported: Not assessed    Respiratory Respiratory Symptoms Reported: Not assesed    Endocrine Endocrine Symptoms Reported: Not assessed    Gastrointestinal Gastrointestinal Symptoms Reported: Nausea, Incontinence Gastrointestinal Comment: The patient states she gets significant nausea twice a week    Genitourinary Genitourinary Symptoms Reported: Other Other Genitourinary Symptoms: Foley Genitourinary Management Strategies: Catheter, indwelling  Integumentary Integumentary Symptoms Reported: Wound Skin Management Strategies: Routine screening, Dressing changes, Coping strategies, Medication therapy Skin Comment: The patient states her wounds and rashes are improving  Musculoskeletal Musculoskelatal Symptoms Reviewed: Not assessed        Psychosocial Psychosocial Symptoms Reported: Not assessed         There were no vitals filed for this visit.    Medications Reviewed Today     Reviewed by Moises Reusing, RN (Case Manager) on 10/10/24 at 1039  Med List Status: <None>   Medication Order Taking? Sig Documenting  Provider Last Dose Status Informant  AFRIN 12 HOUR 0.05 % nasal spray [id number] No Place 1 spray into both nostrils 2 (two) times daily as needed for congestion. [provider] Unknown Active Self  albuterol  (PROVENTIL ) (2.5 MG/3ML) 0.083% nebulizer solution [id number] No USE 1 VIAL IN NEBULIZER EVERY 4 HOURS AS NEEDED FOR WHEEZING FOR SHORTNESS OF BREATH  Patient taking differently: Take 2.5 mg by nebulization every 4 (four) hours as needed for shortness of breath or wheezing.   [name], DO Unknown Active Self  albuterol  (VENTOLIN  HFA) 108 (90 Base) MCG/ACT  inhaler [id number] No Inhale 2 puffs into the lungs every 4 (four) hours as needed for wheezing or shortness of breath. [name], DO Unknown Active Self  ALEVE 220 MG tablet [id number] No Take 220 mg by mouth 2 (two) times daily as needed (for pain). [provider] Past Week Active Self  BANOPHEN  25 MG tablet [id number] No Take 25 mg by mouth 2 (two) times daily as needed for itching. [provider] Unknown Active Self  Continuous Glucose Sensor (FREESTYLE LIBRE 3 PLUS SENSOR) MISC [id number] No Change sensor every 15 days. [name], DO Unknown Active Self  dapagliflozin  propanediol (FARXIGA ) 10 MG TABS tablet [id number] No Take 1 tablet (10 mg total) by mouth daily before breakfast. [name], DO 09/22/2024 Morning Active Self  diclofenac  (FLECTOR ) 1.3 % 1 patch [id number]   [name], DO  Active   diclofenac  Sodium (PENNSAID ) 2 % SOLN [id number] No Apply 2 Pump (40 mg total) topically 2 (two) times daily as needed. 1 application to back twice daily  Patient taking differently: Apply 2 Pump topically 2 (two) times daily as needed (for pain - affected area).   [name], DO Past Week Active Self  diltiazem  (CARDIZEM  CD) 360 MG 24 hr capsule [id number] No Take 1 capsule (360 mg total) by mouth daily.  Patient taking differently: Take 360 mg by mouth at bedtime.   [name], DO 09/22/2024 Bedtime Active Self  ELIQUIS  5 MG TABS tablet [id number] No Take 1 tablet by mouth twice daily  Patient taking differently: Take 5 mg by mouth in the morning and at bedtime.   [name], DO 09/22/2024 11:00 PM Active Self  EPINEPHrine  0.3 mg/0.3 mL IJ SOAJ injection [id number] No Inject 0.3 mg into the muscle as needed for anaphylaxis. [name], DO Unknown Active Self  ferrous sulfate  (FEROSUL) 325 (65 FE) MG tablet [id number] No Take 1 tablet (325 mg total) by mouth daily with breakfast.  Patient  not taking: Reported on 09/23/2024   [name], DO Not Taking Active Self  fluconazole  (DIFLUCAN ) 150 MG tablet [id number]  TAKE 1 TABLET BY MOUTH NOW, REPEAT  IN  72  HOURS  IF  NO  IMPROVEMENT [name], DO  Active   fluticasone  (FLONASE ) 50 MCG/ACT nasal spray [id number] No Place 2 sprays into both nostrils daily as needed for allergies or rhinitis.  Patient taking differently: Place 2 sprays into both nostrils in the morning and at bedtime.   [name], DO 09/22/2024 Bedtime Active Self  fluticasone -salmeterol (ADVAIR) 100-50 MCG/ACT AEPB [id number] No Inhale 1 puff into the lungs 2 (two) times daily. [name], DO 09/21/2024 Active Self  furosemide  (LASIX ) 40 MG tablet [id number] No Take 1 tablet (40 mg total) by mouth daily. [name], DO 09/22/2024 Morning Active Self  gabapentin  (NEURONTIN ) 400 MG capsule [id number] No Take 1 capsule (  400 mg total) by mouth 4 (four) times daily. TAKE 1 CAPSULE BY MOUTH IN THE MORNING AND 1 AT NOON AND 2 AT BEDTIME Strength: 400 mg  Patient taking differently: Take 400-800 mg by mouth See admin instructions. Take 400 mg by mouth with breakfast & at midday and 800 mg at bedtime   [name], DO 09/22/2024 Bedtime Active Self  gentamicin ointment (GARAMYCIN) 0.1 % [id number]  Apply 1 Application topically 2 (two) times daily as needed (for wound care). [provider]  Active Self  Glucagon , rDNA, (GLUCAGON  EMERGENCY) 1 MG KIT [id number] No Inject 1 mg into the vein daily as needed. [name], DO Unknown Active Self  Insulin  Glargine (BASAGLAR  KWIKPEN) 100 UNIT/ML [id number] No Inject 60 Units into the skin at bedtime. [name], DO 09/21/2024 Active Self  insulin  lispro (HUMALOG  KWIKPEN) 200 UNIT/ML KwikPen [id number] No Inject 60 Units into the skin 3 (three) times daily with meals.  Patient taking differently: Inject 25-35 Units into the skin 3 (three)  times daily with meals.   [name], DO 09/22/2024 Noon Active Self  Insulin  Syringe-Needle U-100 (INSULIN  SYRINGE .5CC/30GX5/16) 30G X 5/16 0.5 ML MISC [id number]  Take as directed with Novolog  inuslin.  15-20 units per day [name], DO  Active   Insulin  Syringes, Disposable, U-100 0.5 ML MISC [id number]  100 each by Does not apply route 4 (four) times daily -  before meals and at bedtime. Caleen Burgess BROCKS, MD  Active   isosorbide  mononitrate (IMDUR ) 60 MG 24 hr tablet [id number] No Take 1 tablet (60 mg total) by mouth daily.  Patient taking differently: Take 60 mg by mouth at bedtime.   [name], DO 09/21/2024 Active Self  levocetirizine (XYZAL ) 5 MG tablet [id number] No Take 1 tablet (5 mg total) by mouth every evening. [name], DO 09/21/2024 Active Self  levothyroxine  (SYNTHROID ) 200 MCG tablet [id number] No Take 1 tablet (200 mcg total) by mouth daily before breakfast. [name], DO 09/22/2024 Morning Active Self  lidocaine  (LIDODERM ) 5 % [id number] No Place 1 patch onto the skin every 12 (twelve) hours. Remove & Discard patch within 12 hours or as directed  Patient not taking: Reported on 09/23/2024   Debby Fidela CROME, NP Not Taking Active Self  lidocaine  (XYLOCAINE ) 5 % ointment [id number] No Apply 1 Application topically 2 (two) times daily as needed for mild pain (pain score 1-3). [provider] Unknown Active Self  metFORMIN  (GLUCOPHAGE -XR) 500 MG 24 hr tablet [id number] No Take 2 tablets (1,000 mg total) by mouth 2 (two) times daily. [name], DO 09/22/2024 Morning Active Self  MOUNJARO  5 MG/0.5ML Pen [id number]  Inject 5 mg into the skin every Sunday.  Patient not taking: Reported on 09/26/2024   [provider]  Active Self  Multiple Vitamins-Minerals (WOMENS 50+ MULTI VITAMIN PO) [id number] No Take 1 tablet by mouth in the morning and at bedtime. [provider] Unknown Active    mupirocin  ointment (BACTROBAN ) 2 % [id number] No APPLY  OINTMENT TOPICALLY TWICE DAILY  Patient taking differently: Apply 1 Application topically See admin instructions. Apply to any sores 2 times a day   [name], DO Unknown Active Self  naloxone  (NARCAN ) nasal spray 4 mg/0.1 mL [id number] No Place 1 spray into the nose as needed (opoid overdose). [name], DO Unknown Active Self  nitroGLYCERIN  (NITROSTAT ) 0.4 MG SL tablet [id number] No Place 1 tablet (0.4 mg total) under the tongue  every 5 (five) minutes as needed for chest pain. [name], DO Unknown Active Self  nystatin  (MYCOSTATIN /NYSTOP ) powder [id number] No Apply 1 Application topically as needed (as directed for yeast infections). [provider] Unknown Active Self  omeprazole  (PRILOSEC) 40 MG capsule [id number] No Take 1 capsule (40 mg total) by mouth daily.  Patient taking differently: Take 40 mg by mouth at bedtime.   [name], DO 09/21/2024 Active Self  OneTouch Delica Lancets 33G MISC [id number]  Use to check blood glucose up to 4 times a day [name], DO  Active   [facility] VERIO test strip [id number]  USE AS DIRECTED [name], DO  Active   Oxycodone  HCl 20 MG TABS [id number] No Take 1 tablet (20 mg total) by mouth in the morning, at noon, and at bedtime.  Patient taking differently: Take 20 mg by mouth See admin instructions. Take 20 mg by mouth every six to eight hours   [name], DO 09/22/2024 Noon Active Self  Polyethyl Glyc-Propyl Glyc PF (SYSTANE PRESERVATIVE FREE) 0.4-0.3 % SOLN [id number] No Place 2 drops into both eyes in the morning and at bedtime. [provider] 09/22/2024 Morning Active Self  pramipexole  (MIRAPEX ) 1 MG tablet [id number] No Take 1 tablet (1 mg total) by mouth 3 (three) times daily. [name], DO 09/22/2024 Morning Active Self  promethazine  (PHENERGAN ) 25 MG tablet [id number] No Take 1  tablet (25 mg total) by mouth as needed for vomiting or nausea.  Patient taking differently: Take 25 mg by mouth 3 (three) times daily as needed for nausea or vomiting.   [name], DO Past Week Active Self  spironolactone  (ALDACTONE ) 50 MG tablet [id number] No Take 1 tablet by mouth once daily  Patient taking differently: Take 50 mg by mouth daily at 12 noon.   [name], DO 09/21/2024 Active Self  tirzepatide  (MOUNJARO ) 7.5 MG/0.5ML Pen [id number]  Inject 7.5 mg into the skin once a week. [name], DO  Active Self  Vitamin D , Ergocalciferol , (DRISDOL ) 1.25 MG (50000 UNIT) CAPS capsule [id number] No Take 1 capsule by mouth once a week  Patient taking differently: Take 50,000 Units by mouth every Friday.   [name], DO 09/19/2024 Active Self            Recommendation:   Continue Current Plan of Care  Follow Up Plan:   Telephone follow-up in 1 week  Medford Balboa, BSN, RN [place]  [facility] - [facility] RN Care Manager [phone]     "

## 2024-10-13 ENCOUNTER — Other Ambulatory Visit

## 2024-10-13 ENCOUNTER — Telehealth: Payer: Self-pay | Admitting: Pharmacist

## 2024-10-15 ENCOUNTER — Other Ambulatory Visit: Payer: Self-pay | Admitting: Licensed Clinical Social Worker

## 2024-10-15 ENCOUNTER — Other Ambulatory Visit (HOSPITAL_COMMUNITY): Payer: Self-pay

## 2024-10-15 ENCOUNTER — Other Ambulatory Visit

## 2024-10-15 DIAGNOSIS — Z599 Problem related to housing and economic circumstances, unspecified: Secondary | ICD-10-CM

## 2024-10-15 DIAGNOSIS — Z794 Long term (current) use of insulin: Secondary | ICD-10-CM

## 2024-10-15 NOTE — Patient Instructions (Signed)
 Visit Information  Thank you for taking time to visit with me today. Please don't hesitate to contact me if I can be of assistance to you before our next scheduled appointment.  Our next appointment is by telephone on 10/29/2024 at 1:30 pm Please call the care guide team at [phone] if you need to cancel or reschedule your appointment.   Following is a copy of your care plan:   Goals Addressed             This Visit's Progress    BSW VBCI Social Work Care Plan       Current SDOH Barriers:  Financial constraints related to being able to purchase insurance Transportation No insurance (she thinks she has Cinga), and she is disabled (became disabled 2002) and stated that she is past her time to apply   Interventions: Patient interviewed and appropriate screenings performed Talked to the patient about reapplying for Medicaid, patient stated that she tried to apply before but was over income. Patient could not complete the remainder of the call due to her father in law passing yesterday and family was coming to the house. Sw will review and research and follow back up with the patient in about 2 weeks.           Please call the Suicide and Crisis Lifeline: 988 go to [facility] [address], [place] [phone]) call 911 if you are experiencing a Mental Health or Behavioral Health Crisis or need someone to talk to.  Patient verbalized understanding of Care plan and visit instructions communicated this visit  Tobias CHARM Maranda HEDWIG, PhD [facility], [facility] Social Worker Direct Dial: [phone]  Fax: [phone]

## 2024-10-15 NOTE — Patient Outreach (Signed)
 Social Drivers of Health  Community Resource and Care Coordination Visit Note   10/15/2024  Name: [name] MRN: [id number] DOB:06-Oct-1960  Situation: Referral received for [facility] needs assessment and assistance related to Transportation Financial Strain  [employer]. I obtained verbal consent from Patient.  Visit completed with Patient on the phone.   Background:   SDOH Interventions Today    Flowsheet Row Most Recent Value  SDOH Interventions   Food Insecurity Interventions Intervention Not Indicated  Housing Interventions Intervention Not Indicated  Transportation Interventions Community Resources Provided  [going to gather some information regaring insurance and transportation]  Utilities Interventions Intervention Not Indicated     Assessment:   Goals Addressed             This Visit's Progress    BSW VBCI Social Work Care Plan       Current SDOH Barriers:  Financial constraints related to being able to Ambulance Person No insurance (she thinks she has [place]), and she is disabled (became disabled 2002) and stated that she is past her time to apply   Interventions: Patient interviewed and appropriate screenings performed Talked to the patient about reapplying for Medicaid, patient stated that she tried to apply before but was over income. Patient could not complete the remainder of the call due to her father in law passing yesterday and family was coming to the house. Sw will review and research and follow back up with the patient in about 2 weeks.           Recommendation:   attend all scheduled provider appointments SW will continue to review and research some options for transportation and insurance   Follow Up Plan:   Telephone follow up appointment date/time:  10/29/2024 at 1:30 pm  Tobias CHARM Maranda HEDWIG, PhD [facility], [facility] Social Worker Direct Dial: [phone]  Fax: [phone]

## 2024-10-15 NOTE — Progress Notes (Signed)
 "  10/15/2024 Name: [name] MRN: [id number] DOB: [birth year]  Chief Complaint  Patient presents with   Medication Management    Medication cost    [name] is a 64 y.o. year old female who presented for a telephone visit.   They were referred to the pharmacist by their PCP for assistance in managing diabetes and complex medication management.    Subjective:  Patient states she no longer has insurance as of 09/11/2024. She did fill most of her medications at the end of December for 90 days so she reports she has most of her medications on hand.  She is working with Care Management nurse, [name] on possible enrollment with Medicaid or other program that will help with medication cost.  She mentioned that she would like to continue to use diclofenac  solution because it works better than the diclofenac  patches. She repots [facility] has the lowest cost for 1 bottle of diclofenac  solution - $107   Care Team: Primary Care Provider: [name], DO ; Next Scheduled Visit: 12/05/2024  Medication Access/Adherence  Current Pharmacy:  [facility] [address] [place] - [address] [place] [address] Phone: [phone] Fax: [phone]   Patient reports affordability concerns with their medications: Yes  Patient reports access/transportation concerns to their pharmacy: No  Patient reports adherence concerns with their medications:  No      Diabetes:  Current medications:  metformin  ER 500mg  - 2 tablets = 1000mg  twice a day Farxiga  10mg  daily  Basaglar  / insulin  glargine - 60 units daily Humalog  U200 insulin  - 20 to 60 units 3 times a day per patient.   Mounjaro  5mg  weekly (will increase to 7.5mg  when she completes current box of 5mg )  Past medications tried: Victoza  caused severe, debilitating nausea. Trulicity  - per patient she has constipation but would be willing to retry later if she if not able to afford  Mounjaro .  Current glucose readings: she was using Dex Com G7 sensors but will change to Rice sensors. Only data from Continuous Glucose Monitor available to view today was from the end of December.  Have not connected with patient thru [facility] app yet and she states she did not have time to do it today. Her father in law recently died and she only had a short time today to review her meds and discuss cost.    Macrovascular and Microvascular Risk Reduction:  Statin? no; patient previously intolerant to statin therapy; ACEi/ARB? ACE cough with enalapril  Last urinary albumin/creatinine ratio:  Lab Results  Component Value Date   MICRALBCREAT 30 (H) 05/26/2022   MICRALBCREAT 9 02/03/2020   Last eye exam:  not recently Last foot exam: No foot exam found Tobacco Use:  Tobacco Use: Medium Risk (09/30/2024)   Patient History    Smoking Tobacco Use: Former    Smokeless Tobacco Use: Never    Passive Exposure: Past     Objective:  BP Readings from Last 3 Encounters:  09/25/24 (!) 127/58  03/26/24 128/78  03/11/24 118/70     Lab Results  Component Value Date   HGBA1C 8.0 (H) 09/10/2023    Lab Results  Component Value Date   CREATININE 0.68 09/24/2024   BUN 9 09/24/2024   NA 138 09/24/2024   K 4.1 09/24/2024   CL 101 09/24/2024   CO2 28 09/24/2024    Lab Results  Component Value Date   CHOL 147 05/08/2023   HDL 39.10 05/08/2023  LDLCALC 79 05/08/2023   LDLDIRECT 121.0 10/27/2020   TRIG 142.0 05/08/2023   CHOLHDL 4 05/08/2023    Medications Reviewed Today     Reviewed by [name], RPH-CPP (Pharmacist) on 10/15/24 at 1634  Med List Status: <None>   Medication Order Taking? Sig Documenting Provider Last Dose Status Informant  AFRIN 12 HOUR 0.05 % nasal spray [id number] No Place 1 spray into both nostrils 2 (two) times daily as needed for congestion. [provider] Unknown Active Self  albuterol  (PROVENTIL ) (2.5 MG/3ML) 0.083% nebulizer solution  [id number] No USE 1 VIAL IN NEBULIZER EVERY 4 HOURS AS NEEDED FOR WHEEZING FOR SHORTNESS OF BREATH  Patient taking differently: Take 2.5 mg by nebulization every 4 (four) hours as needed for shortness of breath or wheezing.   [name], DO Unknown Active Self  albuterol  (VENTOLIN  HFA) 108 (90 Base) MCG/ACT inhaler [id number] No Inhale 2 puffs into the lungs every 4 (four) hours as needed for wheezing or shortness of breath. [name], DO Unknown Active Self  ALEVE 220 MG tablet [id number] No Take 220 mg by mouth 2 (two) times daily as needed (for pain). [provider] Past Week Active Self  BANOPHEN  25 MG tablet [id number] No Take 25 mg by mouth 2 (two) times daily as needed for itching. [provider] Unknown Active Self  Continuous Glucose Sensor (FREESTYLE LIBRE 3 PLUS SENSOR) MISC [id number] No Change sensor every 15 days. [name], DO Unknown Active Self  dapagliflozin  propanediol (FARXIGA ) 10 MG TABS tablet [id number] No Take 1 tablet (10 mg total) by mouth daily before breakfast. [name], DO 09/22/2024 Morning Active Self  diclofenac  Sodium (PENNSAID ) 2 % SOLN [id number]  APPLY 2 PUMPS TOPICALLY TO BACK TWICE DAILY AS NEEDED Wendling, Nicholas Paul, DO  Active   diltiazem  (CARDIZEM  CD) 360 MG 24 hr capsule [id number] No Take 1 capsule (360 mg total) by mouth daily.  Patient taking differently: Take 360 mg by mouth at bedtime.   [name], DO 09/22/2024 Bedtime Active Self  ELIQUIS  5 MG TABS tablet [id number] No Take 1 tablet by mouth twice daily  Patient taking differently: Take 5 mg by mouth in the morning and at bedtime.   [name], DO 09/22/2024 11:00 PM Active Self  EPINEPHrine  0.3 mg/0.3 mL IJ SOAJ injection [id number] No Inject 0.3 mg into the muscle as needed for anaphylaxis. [name], DO Unknown Active Self  ferrous sulfate  (FEROSUL) 325 (65 FE) MG tablet [id number] No Take 1  tablet (325 mg total) by mouth daily with breakfast.  Patient not taking: Reported on 09/23/2024   [name], DO Not Taking Active Self  fluconazole  (DIFLUCAN ) 150 MG tablet [id number]  TAKE 1 TABLET BY MOUTH NOW, REPEAT  IN  72  HOURS  IF  NO  IMPROVEMENT [name], DO  Active   fluticasone  (FLONASE ) 50 MCG/ACT nasal spray [id number] No Place 2 sprays into both nostrils daily as needed for allergies or rhinitis.  Patient taking differently: Place 2 sprays into both nostrils in the morning and at bedtime.   [name], DO 09/22/2024 Bedtime Active Self  fluticasone -salmeterol (ADVAIR) 100-50 MCG/ACT AEPB [id number] No Inhale 1 puff into the lungs 2 (two) times daily. [name], DO 09/21/2024 Active Self  furosemide  (LASIX ) 40 MG tablet [id number] No Take 1 tablet (40 mg total) by mouth daily. [name], DO 09/22/2024 Morning Active Self  gabapentin  (NEURONTIN ) 400 MG capsule [id number] No Take  1 capsule (400 mg total) by mouth 4 (four) times daily. TAKE 1 CAPSULE BY MOUTH IN THE MORNING AND 1 AT NOON AND 2 AT BEDTIME Strength: 400 mg  Patient taking differently: Take 400-800 mg by mouth See admin instructions. Take 400 mg by mouth with breakfast & at midday and 800 mg at bedtime   [name], DO 09/22/2024 Bedtime Active Self  gentamicin ointment (GARAMYCIN) 0.1 % [id number]  Apply 1 Application topically 2 (two) times daily as needed (for wound care). [provider]  Active Self  Glucagon , rDNA, (GLUCAGON  EMERGENCY) 1 MG KIT [id number] No Inject 1 mg into the vein daily as needed. [name], DO Unknown Active Self  Insulin  Glargine (BASAGLAR  KWIKPEN) 100 UNIT/ML [id number] No Inject 60 Units into the skin at bedtime. [name], DO 09/21/2024 Active Self  insulin  lispro (HUMALOG  KWIKPEN) 200 UNIT/ML KwikPen [id number] No Inject 60 Units into the skin 3 (three) times daily with meals.  Patient  taking differently: Inject 25-35 Units into the skin 3 (three) times daily with meals.   [name], DO 09/22/2024 Noon Active Self  Insulin  Syringe-Needle U-100 (INSULIN  SYRINGE .5CC/30GX5/16) 30G X 5/16 0.5 ML MISC [id number]  Take as directed with Novolog  inuslin.  15-20 units per day [name], DO  Active   Insulin  Syringes, Disposable, U-100 0.5 ML MISC [id number]  100 each by Does not apply route 4 (four) times daily -  before meals and at bedtime. Caleen Burgess BROCKS, MD  Active   isosorbide  mononitrate (IMDUR ) 60 MG 24 hr tablet [id number] No Take 1 tablet (60 mg total) by mouth daily.  Patient taking differently: Take 60 mg by mouth at bedtime.   [name], DO 09/21/2024 Active Self  levocetirizine (XYZAL ) 5 MG tablet [id number] No Take 1 tablet (5 mg total) by mouth every evening. [name], DO 09/21/2024 Active Self  levothyroxine  (SYNTHROID ) 200 MCG tablet [id number] No Take 1 tablet (200 mcg total) by mouth daily before breakfast. [name], DO 09/22/2024 Morning Active Self  lidocaine  (LIDODERM ) 5 % [id number] No Place 1 patch onto the skin every 12 (twelve) hours. Remove & Discard patch within 12 hours or as directed  Patient not taking: Reported on 09/23/2024   Debby Fidela CROME, NP Not Taking Active Self  lidocaine  (XYLOCAINE ) 5 % ointment [id number] No Apply 1 Application topically 2 (two) times daily as needed for mild pain (pain score 1-3). [provider] Unknown Active Self  metFORMIN  (GLUCOPHAGE -XR) 500 MG 24 hr tablet [id number] No Take 2 tablets (1,000 mg total) by mouth 2 (two) times daily. [name], DO 09/22/2024 Morning Active Self  MOUNJARO  5 MG/0.5ML Pen [id number]  INJECT 5MG  INTO THE SIKIN  ONCE A WEEK FOR 28 DAYS Wendling, [name], DO  Active   Multiple Vitamins-Minerals (WOMENS 50+ MULTI VITAMIN PO) [id number] No Take 1 tablet by mouth in the morning and at bedtime. [provider]  Unknown Active   mupirocin  ointment (BACTROBAN ) 2 % [id number] No APPLY  OINTMENT TOPICALLY TWICE DAILY  Patient taking differently: Apply 1 Application topically See admin instructions. Apply to any sores 2 times a day   [name], DO Unknown Active Self  naloxone  (NARCAN ) nasal spray 4 mg/0.1 mL [id number] No Place 1 spray into the nose as needed (opoid overdose). [name], DO Unknown Active Self  nitroGLYCERIN  (NITROSTAT ) 0.4 MG SL tablet [id number] No Place 1 tablet (0.4 mg total) under the tongue every 5 (  five) minutes as needed for chest pain. [name], DO Unknown Active Self  nystatin  (MYCOSTATIN /NYSTOP ) powder [id number] No Apply 1 Application topically as needed (as directed for yeast infections). [provider] Unknown Active Self  omeprazole  (PRILOSEC) 40 MG capsule [id number] No Take 1 capsule (40 mg total) by mouth daily.  Patient taking differently: Take 40 mg by mouth at bedtime.   [name], DO 09/21/2024 Active Self  OneTouch Delica Lancets 33G MISC [id number]  Use to check blood glucose up to 4 times a day [name], DO  Active   [facility] VERIO test strip [id number]  USE AS DIRECTED [name], DO  Active   Oxycodone  HCl 20 MG TABS [id number] No Take 1 tablet (20 mg total) by mouth in the morning, at noon, and at bedtime.  Patient taking differently: Take 20 mg by mouth See admin instructions. Take 20 mg by mouth every six to eight hours   [name], DO 09/22/2024 Noon Active Self  Polyethyl Glyc-Propyl Glyc PF (SYSTANE PRESERVATIVE FREE) 0.4-0.3 % SOLN [id number] No Place 2 drops into both eyes in the morning and at bedtime. [provider] 09/22/2024 Morning Active Self  pramipexole  (MIRAPEX ) 1 MG tablet [id number] No Take 1 tablet (1 mg total) by mouth 3 (three) times daily. [name], DO 09/22/2024 Morning Active Self  promethazine  (PHENERGAN ) 25 MG tablet  [id number] No Take 1 tablet (25 mg total) by mouth as needed for vomiting or nausea.  Patient taking differently: Take 25 mg by mouth 3 (three) times daily as needed for nausea or vomiting.   [name], DO Past Week Active Self  spironolactone  (ALDACTONE ) 50 MG tablet [id number] No Take 1 tablet by mouth once daily  Patient taking differently: Take 50 mg by mouth daily at 12 noon.   [name], DO 09/21/2024 Active Self  tirzepatide  (MOUNJARO ) 7.5 MG/0.5ML Pen [id number]  Inject 7.5 mg into the skin once a week. [name], DO  Active Self  Vitamin D , Ergocalciferol , (DRISDOL ) 1.25 MG (50000 UNIT) CAPS capsule [id number] No Take 1 capsule by mouth once a week  Patient taking differently: Take 50,000 Units by mouth every Friday.   [name], DO 09/19/2024 Active Self              Assessment/Plan:   Diabetes: Currently uncontrolled; Past GMI showed improved blood glucose control but not at goal A1c <7%. Cardiorenal risk reduction is opportunities for improvement.. Blood pressure is at goal <130/80. LDL is not at goal.  - Reviewed goal A1c, goal fasting, and goal 2 hour post prandial glucose. Recommended to check glucose continuously with Continuous Glucose Monitor  - Recommend to continue Farxiga , Basaglar , Humalog  U200 and metformin . - Increase Mounjaro  to 7.5mg  weekly after she completed 5mg  doses she has on hand.   Medication Adherence:  - Reviewed her medication list for medications we could apply for patient assistance program for. Will send request to Med Assist Team to start patient assistance program application for: Eliquis , Farxiga , Basaglar  and Humalog  - Patient report she has 3 or 4 months of Mounjaro  on hand. We discussed change to Trulicity  since she would likely qualify for patient assistance program for Trulicity . She will think about it. We can check with PCP and add Trulicity  to [facility] application for Humalog  and Basaglar   later if patient want to retry Trulicity  and Dr Frann approves.  - Reviewed her list for [facility] Discount List:   $5 /  30 days list. Furosemide  40mg , gabapentine 300mg , metformin  ER 500mg , omeprazole  40mg , pramipexole  1mg , promethazine  25mg , spironolactone  50mg   $10 / 30 days list - gabapentin  400mg  (#30 only), levocetirizine  $15 / 30 days list - diltiazem  180mg  (should need to take 2 caps to = 360mg ); isosorbide  60mg , levothyroxine  - all strengths;  - Discounted over-the-counter list: diphenhydraine #100 for $1.87; Flonase  nasal spray - $9.35 - She also can download Good Rx card. Price for diclofenac  solution at [facility] with GoodRx card is showing as $76.43 - Patient to continue to work with [name] to see if she would qualify for Medicaid coverage.   Follow Up Plan: 2 weeks   Madelin Ray, PharmD Clinical Pharmacist [facility]  Population Health [phone]    "

## 2024-10-17 ENCOUNTER — Other Ambulatory Visit (HOSPITAL_COMMUNITY): Payer: Self-pay

## 2024-10-17 ENCOUNTER — Telehealth: Payer: Self-pay

## 2024-10-24 ENCOUNTER — Telehealth

## 2024-10-28 ENCOUNTER — Encounter: Admitting: Physical Medicine and Rehabilitation

## 2024-10-29 ENCOUNTER — Other Ambulatory Visit: Admitting: Licensed Clinical Social Worker

## 2024-11-03 ENCOUNTER — Other Ambulatory Visit

## 2024-12-04 ENCOUNTER — Ambulatory Visit: Admitting: Family Medicine

## 2024-12-05 ENCOUNTER — Ambulatory Visit: Admitting: Family Medicine
